# Patient Record
Sex: Male | Born: 1955 | Race: Black or African American | Hispanic: No | Marital: Single | Smoking: Current every day smoker
Health system: Southern US, Community
[De-identification: ages and names within clinical notes are randomized; demographics above are authoritative.]

## PROBLEM LIST (undated history)

## (undated) DIAGNOSIS — I739 Peripheral vascular disease, unspecified: Secondary | ICD-10-CM

## (undated) DIAGNOSIS — F17200 Nicotine dependence, unspecified, uncomplicated: Secondary | ICD-10-CM

## (undated) DIAGNOSIS — G44009 Cluster headache syndrome, unspecified, not intractable: Secondary | ICD-10-CM

## (undated) DIAGNOSIS — J449 Chronic obstructive pulmonary disease, unspecified: Secondary | ICD-10-CM

## (undated) DIAGNOSIS — I2510 Atherosclerotic heart disease of native coronary artery without angina pectoris: Secondary | ICD-10-CM

## (undated) DIAGNOSIS — J9610 Chronic respiratory failure, unspecified whether with hypoxia or hypercapnia: Secondary | ICD-10-CM

## (undated) DIAGNOSIS — I10 Essential (primary) hypertension: Secondary | ICD-10-CM

## (undated) DIAGNOSIS — Z8673 Personal history of transient ischemic attack (TIA), and cerebral infarction without residual deficits: Secondary | ICD-10-CM

## (undated) DIAGNOSIS — D509 Iron deficiency anemia, unspecified: Secondary | ICD-10-CM

## (undated) DIAGNOSIS — E119 Type 2 diabetes mellitus without complications: Secondary | ICD-10-CM

## (undated) DIAGNOSIS — Q2730 Arteriovenous malformation, site unspecified: Secondary | ICD-10-CM

## (undated) DIAGNOSIS — J189 Pneumonia, unspecified organism: Secondary | ICD-10-CM

## (undated) DIAGNOSIS — Z720 Tobacco use: Secondary | ICD-10-CM

## (undated) DIAGNOSIS — I428 Other cardiomyopathies: Secondary | ICD-10-CM

## (undated) DIAGNOSIS — I5042 Chronic combined systolic (congestive) and diastolic (congestive) heart failure: Secondary | ICD-10-CM

## (undated) HISTORY — DX: Other cardiomyopathies: I42.8

## (undated) HISTORY — DX: Peripheral vascular disease, unspecified: I73.9

## (undated) HISTORY — DX: Chronic combined systolic (congestive) and diastolic (congestive) heart failure: I50.42

## (undated) HISTORY — DX: Type 2 diabetes mellitus without complications: E11.9

## (undated) HISTORY — DX: Atherosclerotic heart disease of native coronary artery without angina pectoris: I25.10

## (undated) HISTORY — DX: Nicotine dependence, unspecified, uncomplicated: F17.200

## (undated) HISTORY — DX: Personal history of transient ischemic attack (TIA), and cerebral infarction without residual deficits: Z86.73

## (undated) HISTORY — PX: CATARACT EXTRACTION, BILATERAL: SHX1313

## (undated) HISTORY — DX: Tobacco use: Z72.0

## (undated) HISTORY — PX: COLONOSCOPY W/ BIOPSIES AND POLYPECTOMY: SHX1376

## (undated) HISTORY — DX: Arteriovenous malformation, site unspecified: Q27.30

## (undated) HISTORY — DX: Iron deficiency anemia, unspecified: D50.9

## (undated) HISTORY — DX: Chronic respiratory failure, unspecified whether with hypoxia or hypercapnia: J96.10

## (undated) HISTORY — PX: EXCISION MASS HEAD: SHX6702

---

## 2010-10-10 HISTORY — PX: CARDIAC CATHETERIZATION: SHX172

## 2013-09-09 DIAGNOSIS — J189 Pneumonia, unspecified organism: Secondary | ICD-10-CM

## 2013-09-09 HISTORY — DX: Pneumonia, unspecified organism: J18.9

## 2013-10-03 ENCOUNTER — Inpatient Hospital Stay (HOSPITAL_BASED_OUTPATIENT_CLINIC_OR_DEPARTMENT_OTHER)
Admission: EM | Admit: 2013-10-03 | Discharge: 2013-10-08 | DRG: 189 | Disposition: A | Discharge status: Home / Self Care | Payer: Self-pay | Attending: Internal Medicine | Admitting: Internal Medicine

## 2013-10-03 ENCOUNTER — Encounter (HOSPITAL_BASED_OUTPATIENT_CLINIC_OR_DEPARTMENT_OTHER): Payer: Self-pay | Admitting: Emergency Medicine

## 2013-10-03 ENCOUNTER — Other Ambulatory Visit: Payer: Self-pay

## 2013-10-03 ENCOUNTER — Emergency Department (HOSPITAL_BASED_OUTPATIENT_CLINIC_OR_DEPARTMENT_OTHER): Payer: No Typology Code available for payment source

## 2013-10-03 DIAGNOSIS — I428 Other cardiomyopathies: Secondary | ICD-10-CM | POA: Diagnosis present

## 2013-10-03 DIAGNOSIS — I5043 Acute on chronic combined systolic (congestive) and diastolic (congestive) heart failure: Secondary | ICD-10-CM | POA: Diagnosis present

## 2013-10-03 DIAGNOSIS — I5031 Acute diastolic (congestive) heart failure: Secondary | ICD-10-CM

## 2013-10-03 DIAGNOSIS — Z66 Do not resuscitate: Secondary | ICD-10-CM | POA: Diagnosis present

## 2013-10-03 DIAGNOSIS — I471 Supraventricular tachycardia, unspecified: Secondary | ICD-10-CM | POA: Diagnosis present

## 2013-10-03 DIAGNOSIS — I10 Essential (primary) hypertension: Secondary | ICD-10-CM

## 2013-10-03 DIAGNOSIS — I4719 Other supraventricular tachycardia: Secondary | ICD-10-CM

## 2013-10-03 DIAGNOSIS — J9621 Acute and chronic respiratory failure with hypoxia: Secondary | ICD-10-CM | POA: Diagnosis present

## 2013-10-03 DIAGNOSIS — J449 Chronic obstructive pulmonary disease, unspecified: Secondary | ICD-10-CM

## 2013-10-03 DIAGNOSIS — I2510 Atherosclerotic heart disease of native coronary artery without angina pectoris: Secondary | ICD-10-CM

## 2013-10-03 DIAGNOSIS — I5023 Acute on chronic systolic (congestive) heart failure: Secondary | ICD-10-CM

## 2013-10-03 DIAGNOSIS — F17200 Nicotine dependence, unspecified, uncomplicated: Secondary | ICD-10-CM | POA: Diagnosis present

## 2013-10-03 DIAGNOSIS — J441 Chronic obstructive pulmonary disease with (acute) exacerbation: Secondary | ICD-10-CM

## 2013-10-03 DIAGNOSIS — J189 Pneumonia, unspecified organism: Secondary | ICD-10-CM

## 2013-10-03 DIAGNOSIS — E785 Hyperlipidemia, unspecified: Secondary | ICD-10-CM | POA: Diagnosis present

## 2013-10-03 DIAGNOSIS — E875 Hyperkalemia: Secondary | ICD-10-CM | POA: Diagnosis present

## 2013-10-03 DIAGNOSIS — J9600 Acute respiratory failure, unspecified whether with hypoxia or hypercapnia: Secondary | ICD-10-CM

## 2013-10-03 DIAGNOSIS — R000 Tachycardia, unspecified: Secondary | ICD-10-CM

## 2013-10-03 DIAGNOSIS — I420 Dilated cardiomyopathy: Secondary | ICD-10-CM

## 2013-10-03 DIAGNOSIS — R918 Other nonspecific abnormal finding of lung field: Secondary | ICD-10-CM | POA: Diagnosis present

## 2013-10-03 DIAGNOSIS — Z8673 Personal history of transient ischemic attack (TIA), and cerebral infarction without residual deficits: Secondary | ICD-10-CM

## 2013-10-03 DIAGNOSIS — J432 Centrilobular emphysema: Secondary | ICD-10-CM

## 2013-10-03 DIAGNOSIS — J9620 Acute and chronic respiratory failure, unspecified whether with hypoxia or hypercapnia: Principal | ICD-10-CM | POA: Diagnosis present

## 2013-10-03 DIAGNOSIS — I498 Other specified cardiac arrhythmias: Secondary | ICD-10-CM | POA: Diagnosis present

## 2013-10-03 HISTORY — DX: Essential (primary) hypertension: I10

## 2013-10-03 LAB — CBC WITH DIFFERENTIAL/PLATELET
Basophils Absolute: 0 10*3/uL (ref 0.0–0.1)
Basophils Relative: 0 % (ref 0–1)
Eosinophils Absolute: 0 10*3/uL (ref 0.0–0.7)
Eosinophils Relative: 0 % (ref 0–5)
HCT: 48.5 % (ref 39.0–52.0)
Hemoglobin: 15.5 g/dL (ref 13.0–17.0)
Lymphocytes Relative: 27 % (ref 12–46)
Lymphs Abs: 1.8 10*3/uL (ref 0.7–4.0)
MCH: 30.2 pg (ref 26.0–34.0)
MCHC: 32 g/dL (ref 30.0–36.0)
MCV: 94.5 fL (ref 78.0–100.0)
Monocytes Absolute: 0.8 10*3/uL (ref 0.1–1.0)
Monocytes Relative: 12 % (ref 3–12)
Neutro Abs: 4 10*3/uL (ref 1.7–7.7)
Neutrophils Relative %: 60 % (ref 43–77)
Platelets: 170 10*3/uL (ref 150–400)
RBC: 5.13 MIL/uL (ref 4.22–5.81)
RDW: 14.7 % (ref 11.5–15.5)
WBC: 6.6 10*3/uL (ref 4.0–10.5)

## 2013-10-03 LAB — I-STAT CG4 LACTIC ACID, ED: Lactic Acid, Venous: 1.79 mmol/L (ref 0.5–2.2)

## 2013-10-03 LAB — TROPONIN I
Troponin I: <0.3 ng/mL (ref <0.30)
Troponin I: <0.3 ng/mL (ref <0.30)
Troponin I: <0.3 ng/mL (ref <0.30)

## 2013-10-03 LAB — CBC
HCT: 46.8 % (ref 39.0–52.0)
Hemoglobin: 14.9 g/dL (ref 13.0–17.0)
MCH: 30.2 pg (ref 26.0–34.0)
MCHC: 31.8 g/dL (ref 30.0–36.0)
MCV: 94.9 fL (ref 78.0–100.0)
Platelets: 167 10*3/uL (ref 150–400)
RBC: 4.93 MIL/uL (ref 4.22–5.81)
RDW: 14.5 % (ref 11.5–15.5)
WBC: 6.8 10*3/uL (ref 4.0–10.5)

## 2013-10-03 LAB — HIV ANTIBODY (ROUTINE TESTING W REFLEX): HIV 1&2 Ab, 4th Generation: NONREACTIVE

## 2013-10-03 LAB — PRO B NATRIURETIC PEPTIDE: Pro B Natriuretic peptide (BNP): 1690 pg/mL — ABNORMAL HIGH (ref 0–125)

## 2013-10-03 LAB — I-STAT ARTERIAL BLOOD GAS, ED
Acid-Base Excess: 2 mmol/L (ref 0.0–2.0)
Bicarbonate: 29.8 mEq/L — ABNORMAL HIGH (ref 20.0–24.0)
O2 Saturation: 89 %
Patient temperature: 100.4
TCO2: 32 mmol/L (ref 0–100)
pCO2 arterial: 61.9 mmHg (ref 35.0–45.0)
pH, Arterial: 7.295 — ABNORMAL LOW (ref 7.350–7.450)
pO2, Arterial: 68 mmHg — ABNORMAL LOW (ref 80.0–100.0)

## 2013-10-03 LAB — BASIC METABOLIC PANEL
BUN: 16 mg/dL (ref 6–23)
CO2: 29 mEq/L (ref 19–32)
Calcium: 9.1 mg/dL (ref 8.4–10.5)
Chloride: 103 mEq/L (ref 96–112)
Creatinine, Ser: 1 mg/dL (ref 0.50–1.35)
GFR calc Af Amer: >90 mL/min (ref >90)
GFR calc non Af Amer: 80 mL/min — ABNORMAL LOW (ref >90)
Glucose, Bld: 116 mg/dL — ABNORMAL HIGH (ref 70–99)
Potassium: 4.1 mEq/L (ref 3.7–5.3)
Sodium: 144 mEq/L (ref 137–147)

## 2013-10-03 LAB — CREATININE, SERUM
Creatinine, Ser: 0.88 mg/dL (ref 0.50–1.35)
GFR calc Af Amer: >90 mL/min (ref >90)
GFR calc non Af Amer: >90 mL/min (ref >90)

## 2013-10-03 LAB — HEMOGLOBIN A1C
Hgb A1c MFr Bld: 6.2 % — ABNORMAL HIGH (ref <5.7)
Mean Plasma Glucose: 131 mg/dL — ABNORMAL HIGH (ref <117)

## 2013-10-03 LAB — STREP PNEUMONIAE URINARY ANTIGEN: Strep Pneumo Urinary Antigen: NEGATIVE

## 2013-10-03 MED ORDER — SODIUM CHLORIDE 0.9 % IV SOLN
INTRAVENOUS | Status: DC
  Administered 2013-10-03 (×2): via INTRAVENOUS

## 2013-10-03 MED ORDER — DEXTROSE 5 % IV SOLN
500.0000 mg | INTRAVENOUS | Status: DC
  Administered 2013-10-03 – 2013-10-04 (×2): 500 mg via INTRAVENOUS
  Filled 2013-10-03 (×3): qty 500

## 2013-10-03 MED ORDER — HYDROCODONE-ACETAMINOPHEN 5-325 MG PO TABS: 1.0000 | ORAL_TABLET | ORAL | Status: DC | PRN

## 2013-10-03 MED ORDER — SODIUM CHLORIDE 0.9 % IV SOLN
INTRAVENOUS | Status: DC
  Administered 2013-10-03: 07:00:00 via INTRAVENOUS

## 2013-10-03 MED ORDER — CEFTRIAXONE SODIUM 1 G IJ SOLR
INTRAMUSCULAR | Status: AC
  Filled 2013-10-03: qty 10

## 2013-10-03 MED ORDER — PREDNISONE 50 MG PO TABS
50.0000 mg | ORAL_TABLET | Freq: Every day | ORAL | Status: DC
  Administered 2013-10-03 – 2013-10-04 (×2): 50 mg via ORAL
  Filled 2013-10-03 (×4): qty 1

## 2013-10-03 MED ORDER — IPRATROPIUM-ALBUTEROL 0.5-2.5 (3) MG/3ML IN SOLN
3.0000 mL | RESPIRATORY_TRACT | Status: DC
  Administered 2013-10-03: 3 mL via RESPIRATORY_TRACT
  Filled 2013-10-03: qty 3

## 2013-10-03 MED ORDER — ACETAMINOPHEN 500 MG PO TABS
1000.0000 mg | ORAL_TABLET | Freq: Once | ORAL | Status: AC
  Administered 2013-10-03: 1000 mg via ORAL
  Filled 2013-10-03: qty 2

## 2013-10-03 MED ORDER — ONDANSETRON HCL 4 MG/2ML IJ SOLN: 4.0000 mg | Freq: Four times a day (QID) | INTRAMUSCULAR | Status: DC | PRN

## 2013-10-03 MED ORDER — CARVEDILOL 6.25 MG PO TABS
6.2500 mg | ORAL_TABLET | Freq: Two times a day (BID) | ORAL | Status: DC
  Administered 2013-10-03 – 2013-10-04 (×2): 6.25 mg via ORAL
  Filled 2013-10-03 (×5): qty 1

## 2013-10-03 MED ORDER — GUAIFENESIN-DM 100-10 MG/5ML PO SYRP
5.0000 mL | ORAL_SOLUTION | ORAL | Status: DC | PRN
  Administered 2013-10-04: 5 mL via ORAL
  Filled 2013-10-03: qty 5

## 2013-10-03 MED ORDER — ONDANSETRON HCL 4 MG PO TABS: 4.0000 mg | ORAL_TABLET | Freq: Four times a day (QID) | ORAL | Status: DC | PRN

## 2013-10-03 MED ORDER — ASPIRIN EC 81 MG PO TBEC
81.0000 mg | DELAYED_RELEASE_TABLET | Freq: Every day | ORAL | Status: DC
  Administered 2013-10-03 – 2013-10-08 (×6): 81 mg via ORAL
  Filled 2013-10-03 (×6): qty 1

## 2013-10-03 MED ORDER — ALBUTEROL SULFATE (2.5 MG/3ML) 0.083% IN NEBU
2.5000 mg | INHALATION_SOLUTION | Freq: Once | RESPIRATORY_TRACT | Status: AC
  Administered 2013-10-03: 2.5 mg via RESPIRATORY_TRACT
  Filled 2013-10-03: qty 3

## 2013-10-03 MED ORDER — IPRATROPIUM-ALBUTEROL 0.5-2.5 (3) MG/3ML IN SOLN
3.0000 mL | Freq: Once | RESPIRATORY_TRACT | Status: AC
  Administered 2013-10-03: 3 mL via RESPIRATORY_TRACT
  Filled 2013-10-03: qty 3

## 2013-10-03 MED ORDER — AZITHROMYCIN 500 MG IV SOLR
500.0000 mg | Freq: Once | INTRAVENOUS | Status: AC
  Administered 2013-10-03: 500 mg via INTRAVENOUS

## 2013-10-03 MED ORDER — DEXTROSE 5 % IV SOLN
1.0000 g | INTRAVENOUS | Status: DC
  Administered 2013-10-03 – 2013-10-07 (×5): 1 g via INTRAVENOUS
  Filled 2013-10-03 (×5): qty 10

## 2013-10-03 MED ORDER — DEXTROSE 5 % IV SOLN
1.0000 g | Freq: Once | INTRAVENOUS | Status: AC
  Administered 2013-10-03: 1 g via INTRAVENOUS

## 2013-10-03 MED ORDER — IBUPROFEN 600 MG PO TABS
600.0000 mg | ORAL_TABLET | Freq: Four times a day (QID) | ORAL | Status: DC | PRN
  Administered 2013-10-03: 600 mg via ORAL
  Filled 2013-10-03: qty 1

## 2013-10-03 MED ORDER — AMLODIPINE BESYLATE 10 MG PO TABS
10.0000 mg | ORAL_TABLET | Freq: Every day | ORAL | Status: DC
  Administered 2013-10-03 – 2013-10-07 (×5): 10 mg via ORAL
  Filled 2013-10-03 (×5): qty 1

## 2013-10-03 MED ORDER — METHYLPREDNISOLONE SODIUM SUCC 125 MG IJ SOLR
125.0000 mg | Freq: Once | INTRAMUSCULAR | Status: AC
  Administered 2013-10-03: 125 mg via INTRAVENOUS
  Filled 2013-10-03: qty 2

## 2013-10-03 MED ORDER — HEPARIN SODIUM (PORCINE) 5000 UNIT/ML IJ SOLN
5000.0000 [IU] | Freq: Three times a day (TID) | INTRAMUSCULAR | Status: DC
  Administered 2013-10-03 – 2013-10-04 (×3): 5000 [IU] via SUBCUTANEOUS
  Filled 2013-10-03 (×3): qty 1

## 2013-10-03 MED ORDER — ALBUTEROL SULFATE (2.5 MG/3ML) 0.083% IN NEBU
2.5000 mg | INHALATION_SOLUTION | RESPIRATORY_TRACT | Status: DC | PRN
  Administered 2013-10-03 – 2013-10-04 (×4): 2.5 mg via RESPIRATORY_TRACT
  Filled 2013-10-03 (×4): qty 3

## 2013-10-03 NOTE — Progress Notes (Signed)
ABG done while patient was on 2 liters of O2.

## 2013-10-03 NOTE — ED Notes (Signed)
Pts sister [name], [phone].

## 2013-10-03 NOTE — Progress Notes (Signed)
Spoke with Dr Judd Lien, who has re-assessed the patient, he thinks the patient has significantly improved, and does not need a telemetry bed, he recommends a telemetry bed instead. Now accepted to telemetry.

## 2013-10-03 NOTE — ED Notes (Signed)
Pts room air sat on arrival was 78% per RN.

## 2013-10-03 NOTE — Plan of Care (Signed)
Problem: Phase I Progression Outcomes Goal: First antibiotic given within 6hrs of admit Outcome: Progressing First antibiotic given. Goal: Code status addressed with pt/family Patient told MD he want to be DNR.

## 2013-10-03 NOTE — ED Notes (Signed)
Pt reports sob, chest pain, and headache since Thursday evening, + hypertension, has not taken  Any meds for htn in 3 years

## 2013-10-03 NOTE — H&P (Signed)
Patient Demographics  Jared [name], is a 58 y.o. male  MRN: [id number]   DOB - [birth year]  Admit Date - 10/03/2013  Outpatient Primary MD for the patient is No PCP Per Patient   With History of -  Past Medical History  Diagnosis Date  . Hypertension       History reviewed. No pertinent past surgical history.  in for   Chief Complaint  Patient presents with  . Shortness of Breath     HPI  Jared [name]  is a 58 y.o. male, with no known medical problems except hypertension which he was told several years ago, has not seen a physician in many years and is on no home medications. [facility] with chief complaints of fever, productive cough and shortness of breath. These symptoms started about 4 days ago, at [facility] ER his workup was consistent with acute hypoxic and hypercapnic respiratory failure, community-acquired pneumonia, initially patient required BiPAP however within an hour he came off of BiPAP and was stable on nasal cannula oxygen. He also had mild left-sided chest pain initially upon his presentation which has now completely resolved. Hospitalist were consulted for inpatient workup for above dictated problems.   Patient currently in bed appears comfortable and nontoxic, mild generalized headache, currently no fever chills, currently chest pain-free, shortness of breath is much improved and he is in no distress, does have a productive cough, no abdominal pain, no diarrhea, no blood or mucus in stool, no dysuria, no focal weakness. No unexplained weight loss. He denies choking on food, recent travel or exposure to sick contacts.    Review of Systems    In addition to the HPI above,   No Fever-chills, No Headache, No changes with Vision or hearing, No problems  swallowing food or Liquids, No Chest pain, ++ Cough & Shortness of Breath, No Abdominal pain, No Nausea or Vommitting, Bowel movements are regular, No Blood in stool or Urine, No dysuria, No new skin rashes or bruises, No new joints pains-aches,  No new weakness, tingling, numbness in any extremity, No recent weight gain or loss, No polyuria, polydypsia or polyphagia, No significant Mental Stressors.  A full 10 point Review of Systems was done, except as stated above, all other Review of Systems were negative.   Social History History  Substance Use Topics  . Smoking status: Current Every Day Smoker -- 2.00 packs/day    Types: Cigarettes  . Smokeless tobacco: Not on file  . Alcohol Use: Yes     Comment: occasionally     Family History No CAD at early age    Prior to Admission medications   Not on File    No Known Allergies  Physical Exam  Vitals  Blood pressure 145/89, pulse 102, temperature 98.7 F (37.1 C), temperature source Oral, resp. rate 20, height 6\' 4"  (1.93 m), SpO2 97.00%.   1. General middle aged AA male  lying in bed in NAD,     2. Normal affect and insight, Not Suicidal or Homicidal, Awake Alert, Oriented X 3.  3. No F.N deficits, ALL C.Nerves Intact, Strength 5/5 all 4 extremities, Sensation intact all 4 extremities, Plantars down going.  4. Ears and Eyes appear Normal, Conjunctivae clear, PERRLA. Moist Oral Mucosa.  5. Supple Neck, No JVD, No cervical lymphadenopathy appriciated, No Carotid Bruits.  6. Symmetrical Chest wall movement, Good air movement bilaterally, RLL rales  7. RRR, No Gallops, Rubs or Murmurs, No Parasternal Heave.  8. Positive Bowel Sounds, Abdomen Soft, Non tender, No organomegaly appriciated,No rebound -guarding or rigidity.  9.  No Cyanosis, Normal Skin Turgor, No Skin Rash or Bruise.  10. Good muscle tone,  joints appear normal , no effusions, Normal ROM.  11. No Palpable Lymph Nodes in Neck or Axillae     Data  Review  CBC  Recent Labs Lab 10/03/13 0550  WBC 6.6  HGB 15.5  HCT 48.5  PLT 170  MCV 94.5  MCH 30.2  MCHC 32.0  RDW 14.7  LYMPHSABS 1.8  MONOABS 0.8  EOSABS 0.0  BASOSABS 0.0   ------------------------------------------------------------------------------------------------------------------  Chemistries   Recent Labs Lab 10/03/13 0550  NA 144  K 4.1  CL 103  CO2 29  GLUCOSE 116*  BUN 16  CREATININE 1.00  CALCIUM 9.1   ------------------------------------------------------------------------------------------------------------------ CrCl is unknown because both a height and weight (above a minimum accepted value) are required for this calculation. ------------------------------------------------------------------------------------------------------------------ No results found for this basename: TSH, T4TOTAL, FREET3, T3FREE, THYROIDAB,  in the last 72 hours   Coagulation profile No results found for this basename: INR, PROTIME,  in the last 168 hours ------------------------------------------------------------------------------------------------------------------- No results found for this basename: DDIMER,  in the last 72 hours -------------------------------------------------------------------------------------------------------------------  Cardiac Enzymes  Recent Labs Lab 10/03/13 0550  TROPONINI <0.30   ------------------------------------------------------------------------------------------------------------------ No components found with this basename: POCBNP,    ---------------------------------------------------------------------------------------------------------------  Urinalysis No results found for this basename: colorurine,  appearanceur,  labspec,  phurine,  glucoseu,  hgbur,  bilirubinur,  ketonesur,  proteinur,  urobilinogen,  nitrite,  leukocytesur     ----------------------------------------------------------------------------------------------------------------  Imaging results:   Dg Chest 2 View  10/03/2013   CLINICAL DATA:  Shortness of breath, chest pain and headache.  EXAM: CHEST  2 VIEW  COMPARISON:  None.  FINDINGS: The lungs are well-aerated. Patchy right-sided airspace opacity raises concern for pneumonia. There is no evidence of pleural effusion or pneumothorax.  The heart is normal in size; the mediastinal contour is within normal limits. No acute osseous abnormalities are seen.  IMPRESSION: Patchy right-sided pneumonia noted.   Electronically Signed   By: Roanna Raider M.D.   On: 10/03/2013 06:31    My personal review of EKG: Rhythm S Tach, Rate 108 /min, early LVH , no Acute ST changes    Assessment & Plan    1. Acute hypoxic and hypercapnic respiratory failure. Currently much better, stable on nasal cannula oxygen and appears to be in no distress. Symptoms are brought on by committee acquired pneumonia, initially he did have some evidence of carbon dioxide retention, he was given one dose of IV Solu-Medrol. Currently no wheezing whatsoever. He could have mild underlying COPD with mild exacerbation due to pneumonia.  Plan is to admit him on a telemetry bed, will place him on oral steroids however if he has no wheezing this could be stopped. Will obtain blood and sputum cultures, HIV serology, strep pneumo and Legionella antigen. Empiric  antibiotics which will be IV Rocephin and erythromycin. Oxygen and nebulizer treatments as needed. There is no evidence of aspiration or choking in the history.     2. Chest pain upon admission. Patient has had no medical followup for several years, he could have risk factors for CAD undiagnosed. Will cycle troponins, obtain echo gram to evaluate wall motion, could have underlying LVH due to uncontrolled hypertension. We'll check A1c and lipid panel were disconnected evaluation since he  does not have a PCP. For now we'll place him on low-dose aspirin and Coreg. I do not appreciate any wheezing.     3. Hypertension. Will place him on Coreg and Norvasc.     4.Smoking - counseled.      DVT Prophylaxis Heparin   - SCDs    AM Labs Ordered, also please review Full Orders  Family Communication: Admission, patients condition and plan of care including tests being ordered have been discussed with the patient  who indicates understanding and agree with the plan and Code Status.  Code Status DNR  Likely DC to Home  Condition GUARDED    Time spent in minutes : 35    Leroy SeaPrashant K Jene Huq M.D on 10/03/2013 at 11:44 AM  Between 7am to 7pm - Pager - [phone]  After 7pm go to [email or web address] - password [email or web address]  And look for the night coverage person covering me after hours  [facility] Office  [phone]

## 2013-10-03 NOTE — Progress Notes (Signed)
ANTIBIOTIC CONSULT NOTE - INITIAL  Pharmacy Consult for renal adjust antibiotics Indication: pneumonia  No Known Allergies  Patient Measurements: Height: 6\' 4"  (193 cm) IBW/kg (Calculated) : 86.8   Vital Signs: Temp: 98.7 F (37.1 C) (04/25 1016) Temp src: Oral (04/25 1016) BP: 145/89 mmHg (04/25 1016) Pulse Rate: 102 (04/25 1016) Intake/Output from previous day:   Intake/Output from this shift:    Labs:  Recent Labs  10/03/13 0550  WBC 6.6  HGB 15.5  PLT 170  CREATININE 1.00   The CrCl is unknown because both a height and weight (above a minimum accepted value) are required for this calculation. No results found for this basename: VANCOTROUGH, VANCOPEAK, VANCORANDOM, GENTTROUGH, GENTPEAK, GENTRANDOM, TOBRATROUGH, TOBRAPEAK, TOBRARND, AMIKACINPEAK, AMIKACINTROU, AMIKACIN,  in the last 72 hours   Microbiology: No results found for this or any previous visit (from the past 720 hour(s)).  Medical History: Past Medical History  Diagnosis Date  . Hypertension     Medications:  No prescriptions prior to admission   Assessment: 58 yo man on rocephin  and azithromycin for PNA.  Neither needs adjustment for renal function  Goal of Therapy:  Eradication of infection  Plan:  Cont Abx as ordered  Spike Desilets Poteet Danyl Deems 10/03/2013,11:46 AM

## 2013-10-03 NOTE — ED Notes (Signed)
Pt presents w/ c/o increased shortness of breath, centralized chest pain, productive cough and smoking history. Pt w/ audible wheezing and increased work of breathing on arrival to department. Pt denies hx of asthma or bronchitis or recent fever.

## 2013-10-03 NOTE — ED Provider Notes (Signed)
CSN: [id number]     Arrival date & time 10/03/13  0539 History   First MD Initiated Contact with Patient 10/03/13 [phone]     Chief Complaint  Patient presents with  . Shortness of Breath     (Consider location/radiation/quality/duration/timing/severity/associated sxs/prior Treatment) HPI This is a 58 year old male with a history of cigarette smoking. He is here with a two-day history of chest pain. The chest pain is located in the left parasternal area. The pain is sharp and worse with attempted deep breathing and with coughing. The chest pain has been associated with shortness of breath, difficulty taking a deep breath, subjective fever but no chillls, headache and weakness. He was noted to be hypoxic in the 70s on arrival. His symptoms are moderate to severe. He denies nausea, vomiting or diarrhea. He is fairly new to the area and lives with his sister. He does not have a local physician. He has not taken any antihypertensives in 3 years.  Past Medical History  Diagnosis Date  . Hypertension    History reviewed. No pertinent past surgical history. History reviewed. No pertinent family history. History  Substance Use Topics  . Smoking status: Current Every Day Smoker -- 2.00 packs/day    Types: Cigarettes  . Smokeless tobacco: Not on file  . Alcohol Use: Yes     Comment: occasionally    Review of Systems  All other systems reviewed and are negative.   Allergies  Review of patient's allergies indicates no known allergies.  Home Medications   Prior to Admission medications   Not on File   BP 172/109  Pulse 112  Temp(Src) 100.4 F (38 C) (Oral)  Resp 31  Ht 6\' 4"  (1.93 m)  SpO2 93%  Physical Exam General: Well-developed, well-nourished male in no acute distress; appearance consistent with age of record HENT: normocephalic; atraumatic Eyes: pupils equal, round and reactive to light; extraocular muscles intact; arcus senilis bilaterally Neck: supple Heart: regular rate  and rhythm; tachycardia Lungs: Decreased air movement bilaterally; shallow breaths; faint expiratory wheezes; tachypnea Abdomen: soft; nondistended; mild diffuse tenderness; no masses or hepatosplenomegaly; bowel sounds present Extremities: No deformity; full range of motion; pulses normal; no edema Neurologic: Awake, alert and oriented; motor function intact in all extremities and symmetric; no facial droop Skin: Warm and dry Psychiatric: Flat affect    ED Course  Procedures (including critical care time)  CRITICAL CARE Performed by: Carlisle Beers Saia Derossett Total critical care time: 35 minutes Critical care time was exclusive of separately billable procedures and treating other patients. Critical care was necessary to treat or prevent imminent or life-threatening deterioration. Critical care was time spent personally by me on the following activities: development of treatment plan with patient and/or surrogate as well as nursing, discussions with consultants, evaluation of patient's response to treatment, examination of patient, obtaining history from patient or surrogate, ordering and performing treatments and interventions, ordering and review of laboratory studies, ordering and review of radiographic studies, pulse oximetry and re-evaluation of patient's condition.   MDM  EKG Interpretation:  Date & Time: 10/03/2013 5:47 AM  Rate: 109  Rhythm: sinus tachycardia  QRS Axis: left  Intervals: normal  ST/T Wave abnormalities: normal  Conduction Disutrbances:none  Narrative Interpretation: poor R-wave progression  Old EKG Reviewed: none available  Nursing notes and vitals signs, including pulse oximetry, reviewed.  Summary of this visit's results, reviewed by myself:  Labs:  Results for orders placed during the hospital encounter of 10/03/13 (from the past 24 hour(s))  CBC WITH DIFFERENTIAL     Status: None   Collection Time    10/03/13  5:50 AM      Result Value Ref Range   WBC 6.6   4.0 - 10.5 K/uL   RBC 5.13  4.22 - 5.81 MIL/uL   Hemoglobin 15.5  13.0 - 17.0 g/dL   HCT 16.148.5  09.639.0 - 04.552.0 %   MCV 94.5  78.0 - 100.0 fL   MCH 30.2  26.0 - 34.0 pg   MCHC 32.0  30.0 - 36.0 g/dL   RDW 40.914.7  81.111.5 - 91.415.5 %   Platelets 170  150 - 400 K/uL   Neutrophils Relative % 60  43 - 77 %   Neutro Abs 4.0  1.7 - 7.7 K/uL   Lymphocytes Relative 27  12 - 46 %   Lymphs Abs 1.8  0.7 - 4.0 K/uL   Monocytes Relative 12  3 - 12 %   Monocytes Absolute 0.8  0.1 - 1.0 K/uL   Eosinophils Relative 0  0 - 5 %   Eosinophils Absolute 0.0  0.0 - 0.7 K/uL   Basophils Relative 0  0 - 1 %   Basophils Absolute 0.0  0.0 - 0.1 K/uL  BASIC METABOLIC PANEL     Status: Abnormal   Collection Time    10/03/13  5:50 AM      Result Value Ref Range   Sodium 144  137 - 147 mEq/L   Potassium 4.1  3.7 - 5.3 mEq/L   Chloride 103  96 - 112 mEq/L   CO2 29  19 - 32 mEq/L   Glucose, Bld 116 (*) 70 - 99 mg/dL   BUN 16  6 - 23 mg/dL   Creatinine, Ser 7.821.00  0.50 - 1.35 mg/dL   Calcium 9.1  8.4 - 95.610.5 mg/dL   GFR calc non Af Amer 80 (*) >90 mL/min   GFR calc Af Amer >90  >90 mL/min  PRO B NATRIURETIC PEPTIDE     Status: Abnormal   Collection Time    10/03/13  5:50 AM      Result Value Ref Range   Pro B Natriuretic peptide (BNP) 1690.0 (*) 0 - 125 pg/mL  TROPONIN I     Status: None   Collection Time    10/03/13  5:50 AM      Result Value Ref Range   Troponin I <0.30  <0.30 ng/mL  I-STAT CG4 LACTIC ACID, ED     Status: None   Collection Time    10/03/13  6:21 AM      Result Value Ref Range   Lactic Acid, Venous 1.79  0.5 - 2.2 mmol/L  I-STAT ARTERIAL BLOOD GAS, ED     Status: Abnormal   Collection Time    10/03/13  6:39 AM      Result Value Ref Range   pH, Arterial 7.295 (*) 7.350 - 7.450   pCO2 arterial 61.9 (*) 35.0 - 45.0 mmHg   pO2, Arterial 68.0 (*) 80.0 - 100.0 mmHg   Bicarbonate 29.8 (*) 20.0 - 24.0 mEq/L   TCO2 32  0 - 100 mmol/L   O2 Saturation 89.0     Acid-Base Excess 2.0  0.0 - 2.0 mmol/L    Patient temperature 100.4 F     Collection site RADIAL, ALLEN'S TEST ACCEPTABLE     Drawn by RT     Sample type ARTERIAL     Comment NOTIFIED PHYSICIAN  Imaging Studies: Dg Chest 2 View  10/03/2013   CLINICAL DATA:  Shortness of breath, chest pain and headache.  EXAM: CHEST  2 VIEW  COMPARISON:  None.  FINDINGS: The lungs are well-aerated. Patchy right-sided airspace opacity raises concern for pneumonia. There is no evidence of pleural effusion or pneumothorax.  The heart is normal in size; the mediastinal contour is within normal limits. No acute osseous abnormalities are seen.  IMPRESSION: Patchy right-sided pneumonia noted.   Electronically Signed   By: Roanna Raider M.D.   On: 10/03/2013 06:31    6:19 AM Sat 94-9% on 2L after albuterol/Atrovent neb treatment. Air movement improved, wheezing now present.  6:29 AM Rocephin 1 g IV and Zithromax 500 mg IV ordered for community-acquired pneumonia. Second neb treatment ordered.  6:52 AM Second albuterol/Atrovent neb treatment being administered. Arterial blood gas findings are consistent with hypoxia and partially compensated respiratory acidosis. Dr. Allena Katz has asked that the patient transferred to [facility] stepdown. Lactate is not consistent with a Code Sepsis at this time.      Hanley Seamen, MD 10/03/13 [phone]

## 2013-10-03 NOTE — ED Provider Notes (Signed)
Care assumed from Dr. Read Drivers at shift change.  Patient awaiting admission to [facility] cone pending step down bed availability. Apparently there has been a long delay and no foreseeable step down beds. I reevaluated the patient. His saturations are in the upper 90s on 3 L and his wheezing has improved compared to what was documented earlier. He is in no respiratory distress and I feel as though he is appropriate for a telemetry bed. The orders have been changed and the patient will be admitted to telemetry.  Jared Lyons, MD 10/03/13 [phone]

## 2013-10-04 ENCOUNTER — Inpatient Hospital Stay (HOSPITAL_COMMUNITY): Payer: No Typology Code available for payment source

## 2013-10-04 DIAGNOSIS — R000 Tachycardia, unspecified: Secondary | ICD-10-CM

## 2013-10-04 DIAGNOSIS — J9600 Acute respiratory failure, unspecified whether with hypoxia or hypercapnia: Secondary | ICD-10-CM

## 2013-10-04 DIAGNOSIS — I498 Other specified cardiac arrhythmias: Secondary | ICD-10-CM

## 2013-10-04 DIAGNOSIS — J432 Centrilobular emphysema: Secondary | ICD-10-CM

## 2013-10-04 DIAGNOSIS — I5031 Acute diastolic (congestive) heart failure: Secondary | ICD-10-CM

## 2013-10-04 DIAGNOSIS — J441 Chronic obstructive pulmonary disease with (acute) exacerbation: Secondary | ICD-10-CM

## 2013-10-04 DIAGNOSIS — I517 Cardiomegaly: Secondary | ICD-10-CM

## 2013-10-04 LAB — BASIC METABOLIC PANEL
BUN: 20 mg/dL (ref 6–23)
BUN: 18 mg/dL (ref 6–23)
CO2: 30 mEq/L (ref 19–32)
CO2: 29 mEq/L (ref 19–32)
Calcium: 8.8 mg/dL (ref 8.4–10.5)
Calcium: 8.6 mg/dL (ref 8.4–10.5)
Chloride: 103 mEq/L (ref 96–112)
Chloride: 103 mEq/L (ref 96–112)
Creatinine, Ser: 1.08 mg/dL (ref 0.50–1.35)
Creatinine, Ser: 0.91 mg/dL (ref 0.50–1.35)
GFR calc Af Amer: 85 mL/min — ABNORMAL LOW (ref >90)
GFR calc Af Amer: >90 mL/min (ref >90)
GFR calc non Af Amer: 73 mL/min — ABNORMAL LOW (ref >90)
GFR calc non Af Amer: >90 mL/min (ref >90)
Glucose, Bld: 147 mg/dL — ABNORMAL HIGH (ref 70–99)
Glucose, Bld: 147 mg/dL — ABNORMAL HIGH (ref 70–99)
Potassium: 6 mEq/L — ABNORMAL HIGH (ref 3.7–5.3)
Potassium: 5.1 mEq/L (ref 3.7–5.3)
Sodium: 145 mEq/L (ref 137–147)
Sodium: 142 mEq/L (ref 137–147)

## 2013-10-04 LAB — LIPID PANEL
Cholesterol: 170 mg/dL (ref 0–200)
HDL: 51 mg/dL (ref >39)
LDL Cholesterol: 106 mg/dL — ABNORMAL HIGH (ref 0–99)
Total CHOL/HDL Ratio: 3.3 RATIO
Triglycerides: 65 mg/dL (ref <150)
VLDL: 13 mg/dL (ref 0–40)

## 2013-10-04 LAB — CBC
HCT: 48.5 % (ref 39.0–52.0)
Hemoglobin: 15 g/dL (ref 13.0–17.0)
MCH: 30 pg (ref 26.0–34.0)
MCHC: 30.9 g/dL (ref 30.0–36.0)
MCV: 97 fL (ref 78.0–100.0)
Platelets: 191 10*3/uL (ref 150–400)
RBC: 5 MIL/uL (ref 4.22–5.81)
RDW: 14.5 % (ref 11.5–15.5)
WBC: 8.2 10*3/uL (ref 4.0–10.5)

## 2013-10-04 LAB — D-DIMER, QUANTITATIVE (NOT AT ARMC): D-Dimer, Quant: 0.73 ug/mL-FEU — ABNORMAL HIGH (ref 0.00–0.48)

## 2013-10-04 LAB — LEGIONELLA ANTIGEN, URINE: Legionella Antigen, Urine: NEGATIVE

## 2013-10-04 LAB — EXPECTORATED SPUTUM ASSESSMENT W GRAM STAIN, RFLX TO RESP C

## 2013-10-04 LAB — TROPONIN I: Troponin I: <0.3 ng/mL (ref <0.30)

## 2013-10-04 MED ORDER — IPRATROPIUM BROMIDE 0.02 % IN SOLN
0.5000 mg | RESPIRATORY_TRACT | Status: DC
  Administered 2013-10-04 – 2013-10-05 (×7): 0.5 mg via RESPIRATORY_TRACT
  Filled 2013-10-04 (×7): qty 2.5

## 2013-10-04 MED ORDER — ACETAMINOPHEN 325 MG PO TABS: 650.0000 mg | ORAL_TABLET | Freq: Four times a day (QID) | ORAL | Status: DC | PRN

## 2013-10-04 MED ORDER — BENZONATATE 100 MG PO CAPS
200.0000 mg | ORAL_CAPSULE | Freq: Three times a day (TID) | ORAL | Status: DC
  Administered 2013-10-04 – 2013-10-08 (×12): 200 mg via ORAL
  Filled 2013-10-04 (×17): qty 2

## 2013-10-04 MED ORDER — METOPROLOL TARTRATE 1 MG/ML IV SOLN
5.0000 mg | Freq: Four times a day (QID) | INTRAVENOUS | Status: DC | PRN
Start: 2013-10-04 — End: 2013-10-07

## 2013-10-04 MED ORDER — NICOTINE 21 MG/24HR TD PT24
21.0000 mg | MEDICATED_PATCH | Freq: Every day | TRANSDERMAL | Status: DC
  Administered 2013-10-04 – 2013-10-08 (×5): 21 mg via TRANSDERMAL
  Filled 2013-10-04 (×5): qty 1

## 2013-10-04 MED ORDER — ARFORMOTEROL TARTRATE 15 MCG/2ML IN NEBU
15.0000 ug | INHALATION_SOLUTION | Freq: Two times a day (BID) | RESPIRATORY_TRACT | Status: DC
  Administered 2013-10-04 – 2013-10-07 (×7): 15 ug via RESPIRATORY_TRACT
  Filled 2013-10-04 (×9): qty 2

## 2013-10-04 MED ORDER — METOPROLOL TARTRATE 12.5 MG HALF TABLET
12.5000 mg | ORAL_TABLET | Freq: Two times a day (BID) | ORAL | Status: DC
  Administered 2013-10-04: 12.5 mg via ORAL
  Filled 2013-10-04 (×3): qty 1

## 2013-10-04 MED ORDER — ENOXAPARIN SODIUM 40 MG/0.4ML ~~LOC~~ SOLN
40.0000 mg | SUBCUTANEOUS | Status: DC
  Administered 2013-10-04 – 2013-10-08 (×5): 40 mg via SUBCUTANEOUS
  Filled 2013-10-04 (×5): qty 0.4

## 2013-10-04 MED ORDER — BUDESONIDE 0.5 MG/2ML IN SUSP
0.5000 mg | Freq: Two times a day (BID) | RESPIRATORY_TRACT | Status: DC
  Administered 2013-10-04 – 2013-10-07 (×7): 0.5 mg via RESPIRATORY_TRACT
  Filled 2013-10-04 (×9): qty 2

## 2013-10-04 MED ORDER — LEVALBUTEROL HCL 1.25 MG/0.5ML IN NEBU
1.2500 mg | INHALATION_SOLUTION | RESPIRATORY_TRACT | Status: DC
  Administered 2013-10-04 – 2013-10-05 (×6): 1.25 mg via RESPIRATORY_TRACT
  Filled 2013-10-04 (×13): qty 0.5

## 2013-10-04 MED ORDER — FUROSEMIDE 10 MG/ML IJ SOLN
40.0000 mg | Freq: Two times a day (BID) | INTRAMUSCULAR | Status: DC
  Administered 2013-10-04 – 2013-10-07 (×6): 40 mg via INTRAVENOUS
  Filled 2013-10-04 (×8): qty 4

## 2013-10-04 MED ORDER — LISINOPRIL 2.5 MG PO TABS
2.5000 mg | ORAL_TABLET | Freq: Every day | ORAL | Status: DC
  Administered 2013-10-04: 2.5 mg via ORAL
  Filled 2013-10-04 (×2): qty 1

## 2013-10-04 MED ORDER — HYDROCHLOROTHIAZIDE 25 MG PO TABS
25.0000 mg | ORAL_TABLET | Freq: Every day | ORAL | Status: DC
  Administered 2013-10-04: 25 mg via ORAL
  Filled 2013-10-04: qty 1

## 2013-10-04 MED ORDER — LEVALBUTEROL HCL 1.25 MG/0.5ML IN NEBU
1.2500 mg | INHALATION_SOLUTION | RESPIRATORY_TRACT | Status: DC | PRN
  Filled 2013-10-04: qty 0.5

## 2013-10-04 MED ORDER — METHYLPREDNISOLONE SODIUM SUCC 125 MG IJ SOLR
60.0000 mg | Freq: Four times a day (QID) | INTRAMUSCULAR | Status: DC
  Administered 2013-10-04 – 2013-10-05 (×6): 60 mg via INTRAVENOUS
  Filled 2013-10-04: qty 0.96
  Filled 2013-10-04: qty 2
  Filled 2013-10-04 (×8): qty 0.96

## 2013-10-04 MED ORDER — FUROSEMIDE 10 MG/ML IJ SOLN
40.0000 mg | Freq: Once | INTRAMUSCULAR | Status: AC
  Administered 2013-10-04: 40 mg via INTRAVENOUS
  Filled 2013-10-04: qty 4

## 2013-10-04 NOTE — Progress Notes (Addendum)
[facility] PROGRESS NOTE  Jared [name] ZOX:[id number] DOB: 06/04/1956 DOA: 10/03/2013 PCP: No PCP Per Patient  Assessment/Plan  Acute hypoxic and hypercapnic respiratory failure due to community-acquired pneumonia, possible COPD exacerbation, possible new onset heart failure exacerbation. Required BiPAP initially. Increasing shortness of breath overnight. -  Continue Ceftriaxone and azithromycin -  Strep neg,  legionella antigen pending -  Sputum culture if able -  Followup blood cultures -  Start Solu-Medrol -  Start xopenex and ipra -  Start pulmicort and brovana -  Repeat chest x-ray:  Increased effusion and interstitial pulmonary edema -  Lasix 40mg  IV once  Acute systolic heart failure with acute exacerbation, EF 20-25% -  Start ACEI/BB (metop instead of carvedilol due to COPD) -  Continue lasix -  If potassium allows, eventually start spironolactone -  Continue ASA -  Cardiology consult in AM  Sinus tachycardia, likely related to SOB and nebulizer treatments -  TSH and D-dimer -  D-dimer only mildly elevated and CXR demonstrates increased vascular congestion which is a more likely explanation for his symptoms  Hyperkalemia, unclear etiology as patient did not receive any potassium last night and does not appear acidotic -  Repeat BMP -  Continue IV fluids -  Check EKG  Chest pain, may be due to pneumonia or muscle strain from coughin -  Troponins negative -  D-dimer and CTa chest if positive  Elevated BNP likely secondary to pneumonia but there was some fluid in the fissue on CXR suggestive of heart failure -  F/u ECHO -  Continue ASA and BB  HTN, blood pressures mildly elevated.   -  Continue norvasc -  Start BB and ACEI -  D/c coreg as may be increasing wheeze -  Prn metoprolol for high blood pressure  Cigarette and nicotine abuse and dependence with withdrawal -  Nicotine patch -  Counseled smoking cessation  Diet:  Healthy heart Access:  PIV IVF:   Off  Proph:  Lovenox  Code Status:  DO NOT RESUSCITATE Family Communication:  Patient alone Disposition Plan:  Pending improvement in shortness of breath, anticipate several more days   Consultants:   None  Procedures:   Chest x-ray  Echocardiogram  Antibiotics:   Ceftriaxone from 4/25  Azithromycin from 4/25   HPI/Subjective:  Increased shortness of breath and wheezing. Breathing treatments are not helping very much. Continues to have left-sided chest pressure with intermittent sharp pains, particularly with coughing   Objective: Filed Vitals:   10/03/13 2100 10/04/13 0147 10/04/13 0500 10/04/13 0659  BP: 140/81 133/81 159/99   Pulse: 90 94 116   Temp: 97.6 F (36.4 C) 97.7 F (36.5 C) 100 F (37.8 C) 99.5 F (37.5 C)  TempSrc: Oral Oral Oral Oral  Resp: 20 18 22    Height:      Weight:    94.938 kg (209 lb 4.8 oz)  SpO2: 96% 94% 93%     Intake/Output Summary (Last 24 hours) at 10/04/13 0736 Last data filed at 10/04/13 0700  Gross per 24 hour  Intake   1020 ml  Output   1176 ml  Net   -156 ml   Filed Weights   10/03/13 1130 10/04/13 0659  Weight: 93.3 kg (205 lb 11 oz) 94.938 kg (209 lb 4.8 oz)    Exam:   General:  African American male, in moderate respiratory distress with SCM retractions, subcostal retractions  HEENT:  NCAT, MMM, unable to discern JVP secondary to work of breathing  Cardiovascular:  Tachycardic, RR, nl S1, S2 no mrg, 2+ pulses, warm extremities  Respiratory:  Full expiratory wheeze, diminished throughout, faint rales at the bilateral bases, no rhonchi   Abdomen:   NABS, soft, NT/ND  MSK:   Normal tone and bulk, no LEE  Neuro:  Grossly intact  Data Reviewed: Basic Metabolic Panel:  Recent Labs Lab 10/03/13 0550 10/03/13 1200 10/04/13 0502  NA 144  --  145  K 4.1  --  6.0*  CL 103  --  103  CO2 29  --  30  GLUCOSE 116*  --  147*  BUN 16  --  20  CREATININE 1.00 0.88 1.08  CALCIUM 9.1  --  8.8   Liver Function  Tests: No results found for this basename: AST, ALT, ALKPHOS, BILITOT, PROT, ALBUMIN,  in the last 168 hours No results found for this basename: LIPASE, AMYLASE,  in the last 168 hours No results found for this basename: AMMONIA,  in the last 168 hours CBC:  Recent Labs Lab 10/03/13 0550 10/03/13 1200 10/04/13 0502  WBC 6.6 6.8 8.2  NEUTROABS 4.0  --   --   HGB 15.5 14.9 15.0  HCT 48.5 46.8 48.5  MCV 94.5 94.9 97.0  PLT 170 167 191   Cardiac Enzymes:  Recent Labs Lab 10/03/13 0550 10/03/13 1200 10/03/13 1815 10/03/13 2359  TROPONINI <0.30 <0.30 <0.30 <0.30   BNP (last 3 results)  Recent Labs  10/03/13 0550  PROBNP 1690.0*   CBG: No results found for this basename: GLUCAP,  in the last 168 hours  Recent Results (from the past 240 hour(s))  CULTURE, EXPECTORATED SPUTUM-ASSESSMENT     Status: None   Collection Time    10/04/13  2:51 AM      Result Value Ref Range Status   Specimen Description SPUTUM   Final   Special Requests NONE   Final   Sputum evaluation     Final   Value: THIS SPECIMEN IS ACCEPTABLE. RESPIRATORY CULTURE REPORT TO FOLLOW.   Report Status 10/04/2013 FINAL   Final     Studies: Dg Chest 2 View  10/03/2013   CLINICAL DATA:  Shortness of breath, chest pain and headache.  EXAM: CHEST  2 VIEW  COMPARISON:  None.  FINDINGS: The lungs are well-aerated. Patchy right-sided airspace opacity raises concern for pneumonia. There is no evidence of pleural effusion or pneumothorax.  The heart is normal in size; the mediastinal contour is within normal limits. No acute osseous abnormalities are seen.  IMPRESSION: Patchy right-sided pneumonia noted.   Electronically Signed   By: Roanna Raider M.D.   On: 10/03/2013 06:31    Scheduled Meds: . amLODipine  10 mg Oral Daily  . aspirin EC  81 mg Oral Daily  . azithromycin (ZITHROMAX) 500 MG IVPB  500 mg Intravenous Q24H  . carvedilol  6.25 mg Oral BID WC  . cefTRIAXone (ROCEPHIN)  IV  1 g Intravenous Q24H  .  heparin  5,000 Units Subcutaneous 3 times per day  . predniSONE  50 mg Oral Q breakfast   Continuous Infusions: . sodium chloride 75 mL/hr at 10/03/13 2124    Principal Problem:   Acute respiratory failure Active Problems:   CAP (community acquired pneumonia)   Hypertension    Time spent: 30 min    Renae Fickle  [facility] Pager [phone]. If 7PM-7AM, please contact night-coverage at [email or web address], password [facility] 10/04/2013, 7:36 AM  LOS: 1 day

## 2013-10-04 NOTE — Progress Notes (Signed)
ANTICOAGULATION CONSULT NOTE - Initial Consult  Pharmacy Consult for Lovenox Indication: VTE prophylaxis  No Known Allergies  Patient Measurements: Height: 6\' 4"  (193 cm) Weight: 209 lb 4.8 oz (94.938 kg) (scale b) IBW/kg (Calculated) : 86.8  Vital Signs: Temp: 99.5 F (37.5 C) (04/26 0659) Temp src: Oral (04/26 0659) BP: 159/99 mmHg (04/26 0500) Pulse Rate: 116 (04/26 0500)  Labs:  Recent Labs  10/03/13 0550 10/03/13 1200 10/03/13 1815 10/03/13 2359 10/04/13 0502  HGB 15.5 14.9  --   --  15.0  HCT 48.5 46.8  --   --  48.5  PLT 170 167  --   --  191  CREATININE 1.00 0.88  --   --  1.08  TROPONINI <0.30 <0.30 <0.30 <0.30  --     Estimated Creatinine Clearance: 90.4 ml/min (by C-G formula based on Cr of 1.08).   Medical History: Past Medical History  Diagnosis Date  . Hypertension   . Anginal pain   . Stroke   . Shortness of breath   . Headache(784.0)     Medications:  Scheduled:  . amLODipine  10 mg Oral Daily  . arformoterol  15 mcg Nebulization BID  . aspirin EC  81 mg Oral Daily  . azithromycin (ZITHROMAX) 500 MG IVPB  500 mg Intravenous Q24H  . benzonatate  200 mg Oral TID  . budesonide (PULMICORT) nebulizer solution  0.5 mg Nebulization BID  . cefTRIAXone (ROCEPHIN)  IV  1 g Intravenous Q24H  . hydrochlorothiazide  25 mg Oral Daily  . ipratropium  0.5 mg Nebulization Q4H  . levalbuterol  1.25 mg Nebulization Q4H  . methylPREDNISolone (SOLU-MEDROL) injection  60 mg Intravenous Q6H  . nicotine  21 mg Transdermal Daily    Assessment: 53 YOM admitted on 4/25 with SOB. Pharmacy has been consulted to dose Lovenox for VTE prophylaxis. CBC stable. No s/s of bleeding noted.  Goal of Therapy:  Monitor platelets by anticoagulation protocol: Yes   Plan:  - Lovenox 40mg  sq q24h - Monitor CBC and s/s of bleeding  Christiane Ha A. Lenon Ahmadi, PharmD Clinical Pharmacist - Resident Pager: [phone] Pharmacy: [phone] 10/04/2013 8:45 AM

## 2013-10-04 NOTE — Progress Notes (Signed)
  Echocardiogram 2D Echocardiogram has been performed.  Lorn Junes 10/04/2013, 11:43 AM

## 2013-10-04 NOTE — Progress Notes (Signed)
Utilization review completed.  

## 2013-10-04 NOTE — Clinical Social Work Note (Addendum)
CSW made aware of patient need for assistance with applying for Medicaid/Medicare. CSW met with patient and provided him with Medicaid/Medicare application. CSW explained to patient how to complete form. Patient verbalized understanding. RN made aware. CSW to follow-up tomorrow.  [place], [place] Weekend Clinical Social Worker [phone]

## 2013-10-05 DIAGNOSIS — J189 Pneumonia, unspecified organism: Secondary | ICD-10-CM

## 2013-10-05 DIAGNOSIS — I10 Essential (primary) hypertension: Secondary | ICD-10-CM

## 2013-10-05 DIAGNOSIS — I5023 Acute on chronic systolic (congestive) heart failure: Secondary | ICD-10-CM

## 2013-10-05 DIAGNOSIS — I509 Heart failure, unspecified: Secondary | ICD-10-CM

## 2013-10-05 DIAGNOSIS — J449 Chronic obstructive pulmonary disease, unspecified: Secondary | ICD-10-CM

## 2013-10-05 LAB — CBC
HCT: 48.3 % (ref 39.0–52.0)
Hemoglobin: 14.5 g/dL (ref 13.0–17.0)
MCH: 29.2 pg (ref 26.0–34.0)
MCHC: 30 g/dL (ref 30.0–36.0)
MCV: 97.2 fL (ref 78.0–100.0)
Platelets: 204 10*3/uL (ref 150–400)
RBC: 4.97 MIL/uL (ref 4.22–5.81)
RDW: 14.1 % (ref 11.5–15.5)
WBC: 10.4 10*3/uL (ref 4.0–10.5)

## 2013-10-05 LAB — BASIC METABOLIC PANEL
BUN: 18 mg/dL (ref 6–23)
CO2: 37 mEq/L — ABNORMAL HIGH (ref 19–32)
Calcium: 9.2 mg/dL (ref 8.4–10.5)
Chloride: 94 mEq/L — ABNORMAL LOW (ref 96–112)
Creatinine, Ser: 0.94 mg/dL (ref 0.50–1.35)
GFR calc Af Amer: >90 mL/min (ref >90)
GFR calc non Af Amer: 90 mL/min — ABNORMAL LOW (ref >90)
Glucose, Bld: 153 mg/dL — ABNORMAL HIGH (ref 70–99)
Potassium: 4.3 mEq/L (ref 3.7–5.3)
Sodium: 140 mEq/L (ref 137–147)

## 2013-10-05 LAB — TSH: TSH: 0.324 u[IU]/mL — ABNORMAL LOW (ref 0.350–4.500)

## 2013-10-05 LAB — PRO B NATRIURETIC PEPTIDE: Pro B Natriuretic peptide (BNP): 1242 pg/mL — ABNORMAL HIGH (ref 0–125)

## 2013-10-05 MED ORDER — AZITHROMYCIN 500 MG PO TABS
500.0000 mg | ORAL_TABLET | Freq: Every day | ORAL | Status: DC
  Administered 2013-10-05: 500 mg via ORAL
  Filled 2013-10-05: qty 1

## 2013-10-05 MED ORDER — LISINOPRIL 5 MG PO TABS
5.0000 mg | ORAL_TABLET | Freq: Every day | ORAL | Status: DC
  Administered 2013-10-05 – 2013-10-07 (×3): 5 mg via ORAL
  Filled 2013-10-05 (×3): qty 1

## 2013-10-05 MED ORDER — METHYLPREDNISOLONE SODIUM SUCC 125 MG IJ SOLR
60.0000 mg | Freq: Two times a day (BID) | INTRAMUSCULAR | Status: DC
  Filled 2013-10-05: qty 0.96

## 2013-10-05 MED ORDER — IPRATROPIUM BROMIDE 0.02 % IN SOLN
0.5000 mg | Freq: Four times a day (QID) | RESPIRATORY_TRACT | Status: DC
  Administered 2013-10-05 – 2013-10-08 (×11): 0.5 mg via RESPIRATORY_TRACT
  Filled 2013-10-05 (×10): qty 2.5

## 2013-10-05 MED ORDER — METOPROLOL TARTRATE 25 MG PO TABS
25.0000 mg | ORAL_TABLET | Freq: Two times a day (BID) | ORAL | Status: DC
  Administered 2013-10-05 – 2013-10-08 (×7): 25 mg via ORAL
  Filled 2013-10-05 (×8): qty 1

## 2013-10-05 MED ORDER — LEVALBUTEROL HCL 1.25 MG/0.5ML IN NEBU
1.2500 mg | INHALATION_SOLUTION | Freq: Four times a day (QID) | RESPIRATORY_TRACT | Status: DC
  Administered 2013-10-05 – 2013-10-07 (×8): 1.25 mg via RESPIRATORY_TRACT
  Filled 2013-10-05 (×12): qty 0.5

## 2013-10-05 NOTE — Progress Notes (Signed)
Nutrition Brief Note  Patient identified on the Malnutrition Screening Tool (MST) Report  Wt Readings from Last 15 Encounters:  10/05/13 201 lb 8 oz (91.4 kg)    Body mass index is 24.54 kg/(m^2). Patient meets criteria for Normal Weight based on current BMI. Pt asleep at time of first visit and out of room at time of second visit  Current diet order is Heart healthy, patient is consuming approximately 100% of all meals at this time. Labs and medications reviewed.   No nutrition interventions warranted at this time. If nutrition issues arise, please consult RD.   Ian Malkin RD, LDN Inpatient Clinical Dietitian Pager: [phone] After Hours Pager: [phone]

## 2013-10-05 NOTE — Progress Notes (Signed)
[facility] PROGRESS NOTE  Jared [name] DXI:[id number] DOB: February 17, 1956 DOA: 10/03/2013 PCP: No PCP Per Patient  Assessment/Plan  Acute hypoxic and hypercapnic respiratory failure due to community-acquired pneumonia, COPD exacerbation, acute on chronic systolic heart failure. Required BiPAP initially. Increasing shortness of breath overnight. -  Continue Ceftriaxone and azithromycin day 3 -  Strep neg & legionella antigen both neg -  Sputum culture pending -  Blood cultures NGTD -  Taper Solu-Medrol to BID -  Continue xopenex and ipra -  Start pulmicort and brovana  Acute on chronic systolic heart failure with acute exacerbation, EF 20-25% -  -2L -  Wt down 4kg -  Continue ACEI/BB (metop instead of carvedilol due to COPD) and titrate for BP -  Continue lasix -  If potassium allows, eventually start spironolactone -  Continue ASA -  Cardiology consult in AM  Sinus tachycardia, likely related to SOB and nebulizer treatments -  TSH low -  F/u fT3/fT4 -  D-dimer only mildly elevated and we have better explanations (COPD and acute heart failure exac) for his SOB  Hyperkalemia, likely spurious and corrected spontaneously  Chest pain, may be due to pneumonia or muscle strain from coughin.  Troponins negative.    HTN, blood pressures mildly elevated.   -  Continue norvasc -  Continue BB and ACEI   Cigarette and nicotine abuse and dependence with withdrawal -  Nicotine patch -  Counseled smoking cessation  Diet:  Healthy heart Access:  PIV IVF:  Off  Proph:  Lovenox  Code Status:  DO NOT RESUSCITATE Family Communication:  Patient alone Disposition Plan:  Pending improvement in shortness of breath, anticipate several more days   Consultants:   None  Procedures:   Chest x-ray  Echocardiogram  Antibiotics:   Ceftriaxone from 4/25  Azithromycin from 4/25 s/p 1.5gm  HPI/Subjective:  Chest tightness and pain are much better.  Breathing more comfortably  today  Objective: Filed Vitals:   10/05/13 0021 10/05/13 0625 10/05/13 0858 10/05/13 0900  BP: 154/90 154/92  137/80  Pulse: 92 97  97  Temp: 98.2 F (36.8 C) 98.2 F (36.8 C)    TempSrc: Oral Oral    Resp: 24 22    Height:      Weight:  91.4 kg (201 lb 8 oz)    SpO2: 98% 93% 95%     Intake/Output Summary (Last 24 hours) at 10/05/13 1428 Last data filed at 10/05/13 1200  Gross per 24 hour  Intake    960 ml  Output   4275 ml  Net  -3315 ml   Filed Weights   10/03/13 1130 10/04/13 0659 10/05/13 0625  Weight: 93.3 kg (205 lb 11 oz) 94.938 kg (209 lb 4.8 oz) 91.4 kg (201 lb 8 oz)    Exam:   General:  African American male, NAD resting in bed, 3L [place]  HEENT:  NCAT, MMM  Cardiovascular:  Tachycardic, RR, nl S1, S2 no mrg, 2+ pulses, warm extremities  Respiratory:  Full expiratory wheeze, diminished throughout, rales at the bilateral bases, no rhonchi   Abdomen:   NABS, soft, NT/ND  MSK:   Normal tone and bulk, no LEE  Neuro:  Grossly intact  Data Reviewed: Basic Metabolic Panel:  Recent Labs Lab 10/03/13 0550 10/03/13 1200 10/04/13 0502 10/04/13 0930 10/05/13 0415  NA 144  --  145 142 140  K 4.1  --  6.0* 5.1 4.3  CL 103  --  103 103 94*  CO2 29  --  30 29 37*  GLUCOSE 116*  --  147* 147* 153*  BUN 16  --  20 18 18   CREATININE 1.00 0.88 1.08 0.91 0.94  CALCIUM 9.1  --  8.8 8.6 9.2   Liver Function Tests: No results found for this basename: AST, ALT, ALKPHOS, BILITOT, PROT, ALBUMIN,  in the last 168 hours No results found for this basename: LIPASE, AMYLASE,  in the last 168 hours No results found for this basename: AMMONIA,  in the last 168 hours CBC:  Recent Labs Lab 10/03/13 0550 10/03/13 1200 10/04/13 0502 10/05/13 0415  WBC 6.6 6.8 8.2 10.4  NEUTROABS 4.0  --   --   --   HGB 15.5 14.9 15.0 14.5  HCT 48.5 46.8 48.5 48.3  MCV 94.5 94.9 97.0 97.2  PLT 170 167 191 204   Cardiac Enzymes:  Recent Labs Lab 10/03/13 0550 10/03/13 1200  10/03/13 1815 10/03/13 2359  TROPONINI <0.30 <0.30 <0.30 <0.30   BNP (last 3 results)  Recent Labs  10/03/13 0550 10/05/13 1045  PROBNP 1690.0* 1242.0*   CBG: No results found for this basename: GLUCAP,  in the last 168 hours  Recent Results (from the past 240 hour(s))  CULTURE, BLOOD (ROUTINE X 2)     Status: None   Collection Time    10/03/13  6:05 AM      Result Value Ref Range Status   Specimen Description BLOOD RIGHT FOREARM   Final   Special Requests BOTTLES DRAWN AEROBIC AND ANAEROBIC 5CC EACH   Final   Culture  Setup Time     Final   Value: 10/03/2013 13:51     Performed at [facility]   Culture     Final   Value:        BLOOD CULTURE RECEIVED NO GROWTH TO DATE CULTURE WILL BE HELD FOR 5 DAYS BEFORE ISSUING A FINAL NEGATIVE REPORT     Performed at [facility]   Report Status PENDING   Incomplete  CULTURE, BLOOD (ROUTINE X 2)     Status: None   Collection Time    10/03/13  6:10 AM      Result Value Ref Range Status   Specimen Description BLOOD LEFT HAND   Final   Special Requests BOTTLES DRAWN AEROBIC AND ANAEROBIC 5CC EACH   Final   Culture  Setup Time     Final   Value: 10/03/2013 13:51     Performed at [facility]   Culture     Final   Value:        BLOOD CULTURE RECEIVED NO GROWTH TO DATE CULTURE WILL BE HELD FOR 5 DAYS BEFORE ISSUING A FINAL NEGATIVE REPORT     Performed at [facility]   Report Status PENDING   Incomplete  CULTURE, BLOOD (ROUTINE X 2)     Status: None   Collection Time    10/03/13 11:55 AM      Result Value Ref Range Status   Specimen Description BLOOD LEFT ARM   Final   Special Requests BOTTLES DRAWN AEROBIC AND ANAEROBIC 5CC   Final   Culture  Setup Time     Final   Value: 10/03/2013 20:29     Performed at [facility]   Culture     Final   Value:        BLOOD CULTURE RECEIVED NO GROWTH TO DATE CULTURE WILL BE HELD FOR 5 DAYS BEFORE ISSUING A FINAL NEGATIVE REPORT  Performed at [facility]   Report Status PENDING   Incomplete  CULTURE, BLOOD (ROUTINE X 2)     Status: None   Collection Time    10/03/13 12:00 PM      Result Value Ref Range Status   Specimen Description BLOOD RIGHT HAND   Final   Special Requests BOTTLES DRAWN AEROBIC AND ANAEROBIC 5CC   Final   Culture  Setup Time     Final   Value: 10/03/2013 21:16     Performed at [facility]   Culture     Final   Value:        BLOOD CULTURE RECEIVED NO GROWTH TO DATE CULTURE WILL BE HELD FOR 5 DAYS BEFORE ISSUING A FINAL NEGATIVE REPORT     Performed at [facility]   Report Status PENDING   Incomplete  CULTURE, EXPECTORATED SPUTUM-ASSESSMENT     Status: None   Collection Time    10/04/13  2:51 AM      Result Value Ref Range Status   Specimen Description SPUTUM   Final   Special Requests NONE   Final   Sputum evaluation     Final   Value: THIS SPECIMEN IS ACCEPTABLE. RESPIRATORY CULTURE REPORT TO FOLLOW.   Report Status 10/04/2013 FINAL   Final  CULTURE, RESPIRATORY (NON-EXPECTORATED)     Status: None   Collection Time    10/04/13  2:51 AM      Result Value Ref Range Status   Specimen Description SPUTUM   Final   Special Requests NONE   Final   Gram Stain     Final   Value: RARE WBC PRESENT, PREDOMINANTLY MONONUCLEAR     RARE SQUAMOUS EPITHELIAL CELLS PRESENT     RARE GRAM POSITIVE RODS     RARE GRAM POSITIVE COCCI IN PAIRS     Performed at [facility]   Culture     Final   Value: Culture reincubated for better growth     Performed at [facility]   Report Status PENDING   Incomplete     Studies: Dg Chest Port 1 View  10/04/2013   CLINICAL DATA:  Wheezing, shortness of breath  EXAM: PORTABLE CHEST - 1 VIEW  COMPARISON:  Prior chest x-ray 10/03/2013  FINDINGS: Apparent interval progression of cardiomegaly likely related to portable frontal technique. However, there has been interval development of diffuse mild interstitial prominence consistent with interstitial  edema. No pleural effusion or pneumothorax. No acute osseous abnormality.  IMPRESSION: Interval development of diffuse bilateral interstitial prominence favored to reflect interstitial pulmonary edema and mild CHF.   Electronically Signed   By: Malachy Moan M.D.   On: 10/04/2013 08:55    Scheduled Meds: . amLODipine  10 mg Oral Daily  . arformoterol  15 mcg Nebulization BID  . aspirin EC  81 mg Oral Daily  . azithromycin  500 mg Oral Daily  . benzonatate  200 mg Oral TID  . budesonide (PULMICORT) nebulizer solution  0.5 mg Nebulization BID  . cefTRIAXone (ROCEPHIN)  IV  1 g Intravenous Q24H  . enoxaparin (LOVENOX) injection  40 mg Subcutaneous Q24H  . furosemide  40 mg Intravenous BID  . ipratropium  0.5 mg Nebulization Q6H  . levalbuterol  1.25 mg Nebulization 4 times per day  . lisinopril  5 mg Oral Daily  . methylPREDNISolone (SOLU-MEDROL) injection  60 mg Intravenous Q6H  . metoprolol tartrate  25 mg Oral BID  . nicotine  21 mg Transdermal Daily   Continuous Infusions:    Principal Problem:   Acute respiratory failure Active Problems:   CAP (community acquired pneumonia)   Hypertension   COPD with acute exacerbation   Acute diastolic congestive heart failure   Sinus tachycardia    Time spent: 30 min    Renae FickleMackenzie Jacqulyne Gladue  [facility] Pager [phone]. If 7PM-7AM, please contact night-coverage at [email or web address], password [email or web address] 10/05/2013, 2:28 PM  LOS: 2 days

## 2013-10-05 NOTE — Consult Note (Addendum)
Primary Physician: Primary Cardiologist:  new   HPI: Patient is a 57 yo with a history of HTN  Not on meds at home.  Went to [facility] ER with fever, cough, SOB  Patinet says he has been getting more SOB over several weeks, worse about 5 days prior to admission.  Felt feverish but did not take temp  Cough productive of clear sputum  Develped chest tightness.   Patinet was treated with BiPAP initially.  .   Patient placed on ABX.  Treated with solumedrol, xopenex, pulmicort.   Echo showed LVEF 20 to 25%  LV normal in size, mild LVH.  Started on ACE I/BB, Lasix.    The patient says he moved to [place] about 2 moonths ago  Previously lived near [place].  He says about 6 years ago he was hospitalized down there [facility])  Told heart pumping was down  Had cardiac cath  Does not remember results.  Was placed on meds but didn't take because he couldn't afford them.    Now his breathing is better from admit but not at baseline.        Past Medical History  Diagnosis Date  . Hypertension   . Anginal pain   . Stroke   . Shortness of breath   . Headache(784.0)     No prescriptions prior to admission     . amLODipine  10 mg Oral Daily  . arformoterol  15 mcg Nebulization BID  . aspirin EC  81 mg Oral Daily  . azithromycin (ZITHROMAX) 500 MG IVPB  500 mg Intravenous Q24H  . benzonatate  200 mg Oral TID  . budesonide (PULMICORT) nebulizer solution  0.5 mg Nebulization BID  . cefTRIAXone (ROCEPHIN)  IV  1 g Intravenous Q24H  . enoxaparin (LOVENOX) injection  40 mg Subcutaneous Q24H  . furosemide  40 mg Intravenous BID  . ipratropium  0.5 mg Nebulization Q4H  . levalbuterol  1.25 mg Nebulization Q4H  . lisinopril  2.5 mg Oral Daily  . methylPREDNISolone (SOLU-MEDROL) injection  60 mg Intravenous Q6H  . metoprolol tartrate  12.5 mg Oral BID  . nicotine  21 mg Transdermal Daily    Infusions:    No Known Allergies  History   Social History  . Marital Status: Married      Spouse Name: N/A    Number of Children: N/A  . Years of Education: N/A   Occupational History  . Not on file.   Social History Main Topics  . Smoking status: Current Every Day Smoker -- 2.00 packs/day    Types: Cigarettes  . Smokeless tobacco: Not on file  . Alcohol Use: Yes     Comment: occasionally  . Drug Use: No  . Sexual Activity: Yes   Other Topics Concern  . Not on file   Social History Narrative  . No narrative on file    History reviewed. No pertinent family history.  REVIEW OF SYSTEMS:  All systems reviewed  Negative to the above problem except as noted above.    PHYSICAL EXAM: Filed Vitals:   10/05/13 0625  BP: 154/92  Pulse: 97  Temp: 98.2 F (36.8 C)  Resp: 22     Intake/Output Summary (Last 24 hours) at 10/05/13 0819 Last data filed at 10/05/13 0300  Gross per 24 hour  Intake    840 ml  Output   3800 ml  Net  -2960 ml    General:  Well appearing. No respiratory  difficulty HEENT: normal Neck: supple. JVP approx 10 Carotids 2+ bilat; no bruits. No lymphadenopathy or thryomegaly appreciated. Cor: PMI nondisplaced. Regular rate & rhythm. No rubs, gallops or murmurs. Lungs: Decreased airflow  Rales at bases  Occasional wheeze Abdomen: soft, nontender, nondistended. No hepatosplenomegaly. No bruits or masses. Good bowel sounds. Extremities: no cyanosis, clubbing, rash, edema Neuro: alert & oriented x 3, cranial nerves grossly intact. moves all 4 extremities w/o difficulty. Affect pleasant.  ECG:  4/26:  ST 113.  LVH  Possible IWMI  Results for orders placed during the hospital encounter of 10/03/13 (from the past 24 hour(s))  BASIC METABOLIC PANEL     Status: Abnormal   Collection Time    10/04/13  9:30 AM      Result Value Ref Range   Sodium 142  137 - 147 mEq/L   Potassium 5.1  3.7 - 5.3 mEq/L   Chloride 103  96 - 112 mEq/L   CO2 29  19 - 32 mEq/L   Glucose, Bld 147 (*) 70 - 99 mg/dL   BUN 18  6 - 23 mg/dL   Creatinine, Ser 7.62  0.50  - 1.35 mg/dL   Calcium 8.6  8.4 - 26.3 mg/dL   GFR calc non Af Amer >90  >90 mL/min   GFR calc Af Amer >90  >90 mL/min  D-DIMER, QUANTITATIVE     Status: Abnormal   Collection Time    10/04/13  9:30 AM      Result Value Ref Range   D-Dimer, Quant 0.73 (*) 0.00 - 0.48 ug/mL-FEU  TSH     Status: Abnormal   Collection Time    10/05/13  4:15 AM      Result Value Ref Range   TSH 0.324 (*) 0.350 - 4.500 uIU/mL  BASIC METABOLIC PANEL     Status: Abnormal   Collection Time    10/05/13  4:15 AM      Result Value Ref Range   Sodium 140  137 - 147 mEq/L   Potassium 4.3  3.7 - 5.3 mEq/L   Chloride 94 (*) 96 - 112 mEq/L   CO2 37 (*) 19 - 32 mEq/L   Glucose, Bld 153 (*) 70 - 99 mg/dL   BUN 18  6 - 23 mg/dL   Creatinine, Ser 3.35  0.50 - 1.35 mg/dL   Calcium 9.2  8.4 - 45.6 mg/dL   GFR calc non Af Amer 90 (*) >90 mL/min   GFR calc Af Amer >90  >90 mL/min  CBC     Status: None   Collection Time    10/05/13  4:15 AM      Result Value Ref Range   WBC 10.4  4.0 - 10.5 K/uL   RBC 4.97  4.22 - 5.81 MIL/uL   Hemoglobin 14.5  13.0 - 17.0 g/dL   HCT 25.6  38.9 - 37.3 %   MCV 97.2  78.0 - 100.0 fL   MCH 29.2  26.0 - 34.0 pg   MCHC 30.0  30.0 - 36.0 g/dL   RDW 42.8  76.8 - 11.5 %   Platelets 204  150 - 400 K/uL   Dg Chest Port 1 View  10/04/2013   CLINICAL DATA:  Wheezing, shortness of breath  EXAM: PORTABLE CHEST - 1 VIEW  COMPARISON:  Prior chest x-ray 10/03/2013  FINDINGS: Apparent interval progression of cardiomegaly likely related to portable frontal technique. However, there has been interval development of diffuse mild interstitial prominence consistent with interstitial  edema. No pleural effusion or pneumothorax. No acute osseous abnormality.  IMPRESSION: Interval development of diffuse bilateral interstitial prominence favored to reflect interstitial pulmonary edema and mild CHF.   Electronically Signed   By: Malachy MoanHeath  McCullough M.D.   On: 10/04/2013 08:55     ASSESSMENT: Patient is a 58  yo admitted with SOB, cough  Work up revealed severely depressed LVEF    On exam, JVP is increased  Lung exam shows some rales, decreased airflow consistent with COPD Patient reports knowing that he had heart failure  Cath in past.  Not on meds  COntinues to smoke  REcomm:  Continue diuresis.  Check BNP Agree with empiric Rx for pulmonary infection though I am not convinced of this being an active problem.  Would continue steroids for now. Need to get records from [facility] (patient says it changed names) to review cath/echo  WOuld continue metoprolol and lisinopril  WOuld titrate to improve bp control  COuld back down on amlodipine and push doses further.  Patient will need long term follow up  He has risk factors for CAD but I am not convinced of an acute ischemic event as cause for presentation.  Need to review outside records  Consider myoview  2.  HTN  Folllow  3.  Tob  Counselled on cessation.  4.  HCM  Check lipids.    Pricilla RifflePaula V Ross     Addendum:   Received records from [facility]    Cardiac cath (10/11/2010)  Done for CP  Known cardiomyopathy prior.  LM normal  LAD 20% irregularities LCX 20% proximal RCA:  Dominant; 40% proximal, 40% distal LVEF 35% with diffuse hypokinesis.  11/02/10  Admitted for CP and dizziness.  R/O fro MI  Based on this would recomm continued medical care.   Optimize meds. I would not recomm any ischemic evaluations. Patient needs f/u in clinic  Has beeen noncompliant.  Pricilla RifflePaula V Ross

## 2013-10-06 LAB — CBC
HCT: 49.7 % (ref 39.0–52.0)
Hemoglobin: 15.7 g/dL (ref 13.0–17.0)
MCH: 30.2 pg (ref 26.0–34.0)
MCHC: 31.6 g/dL (ref 30.0–36.0)
MCV: 95.6 fL (ref 78.0–100.0)
Platelets: 214 10*3/uL (ref 150–400)
RBC: 5.2 MIL/uL (ref 4.22–5.81)
RDW: 13.7 % (ref 11.5–15.5)
WBC: 13 10*3/uL — ABNORMAL HIGH (ref 4.0–10.5)

## 2013-10-06 LAB — BASIC METABOLIC PANEL
BUN: 23 mg/dL (ref 6–23)
CO2: 43 mEq/L (ref 19–32)
Calcium: 9.3 mg/dL (ref 8.4–10.5)
Chloride: 94 mEq/L — ABNORMAL LOW (ref 96–112)
Creatinine, Ser: 1.03 mg/dL (ref 0.50–1.35)
GFR calc Af Amer: 90 mL/min — ABNORMAL LOW (ref >90)
GFR calc non Af Amer: 78 mL/min — ABNORMAL LOW (ref >90)
Glucose, Bld: 139 mg/dL — ABNORMAL HIGH (ref 70–99)
Potassium: 4.6 mEq/L (ref 3.7–5.3)
Sodium: 147 mEq/L (ref 137–147)

## 2013-10-06 LAB — T4, FREE: Free T4: 0.92 ng/dL (ref 0.80–1.80)

## 2013-10-06 LAB — T3, FREE: T3, Free: 2.4 pg/mL (ref 2.3–4.2)

## 2013-10-06 LAB — CULTURE, RESPIRATORY W GRAM STAIN: Culture: NORMAL

## 2013-10-06 MED ORDER — SIMVASTATIN 20 MG PO TABS
20.0000 mg | ORAL_TABLET | Freq: Every day | ORAL | Status: DC
  Administered 2013-10-06 – 2013-10-07 (×2): 20 mg via ORAL
  Filled 2013-10-06 (×3): qty 1

## 2013-10-06 MED ORDER — PREDNISONE 50 MG PO TABS
60.0000 mg | ORAL_TABLET | Freq: Every day | ORAL | Status: DC
  Administered 2013-10-06 – 2013-10-07 (×2): 60 mg via ORAL
  Filled 2013-10-06 (×3): qty 1

## 2013-10-06 MED ORDER — ACETAZOLAMIDE 250 MG PO TABS
250.0000 mg | ORAL_TABLET | Freq: Every day | ORAL | Status: DC
  Administered 2013-10-06 – 2013-10-08 (×3): 250 mg via ORAL
  Filled 2013-10-06 (×3): qty 1

## 2013-10-06 NOTE — Progress Notes (Addendum)
[facility] PROGRESS NOTE  Haynes DageJohnny Stiehl [name]:[id number] DOB: 07/18/1955 DOA: 10/03/2013 PCP: No PCP Per Patient  Brief Summary  Haynes DageJohnny [name] is a 58 y.o. male, with no known medical problems except hypertension which he was told several years ago, has not seen a physician in many years and is on no home medications. [facility] with chief complaints of fever, productive cough and shortness of breath. He is being treated for community-acquired pneumonia, COPD exacerbation and acute on chronic systolic heart failure.  Assessment/Plan  Acute hypoxic and hypercapnic respiratory failure due to community-acquired pneumonia, COPD exacerbation, acute on chronic systolic heart failure. Required BiPAP initially.  -  Continue Ceftriaxone day 4 -  Completed 1.5gm azithromycin -  Strep neg & legionella antigen both neg -  Sputum culture normal OP flora -  Blood cultures NGTD -  D/c solumedrol and start daily prednisone -  Continue xopenex and ipra -  Continue pulmicort and brovana  Acute on chronic systolic heart failure with acute exacerbation, EF 20-25%, BUN and creatinine approximately stable -  -1.4L -  Wt on admit:  93.3kg >> 90 kg -  Continue ACEI/BB (metop instead of carvedilol due to COPD) and titrate for BP -  Continue lasix -  If potassium allows, eventually start spironolactone -  Diamox added by cardiology -  Continue ASA -  Appreciate cardiology assistance  Sinus tachycardia, likely related to SOB and nebulizer treatments and resolving -  FT3/fT4 wnl -  D-dimer only mildly elevated and we have better explanations (COPD and acute heart failure exac) for his SOB  Abnl TSH:  Likely suppressed from IV steroids.  fT3/fT4 are stable -  Repeat TFTs in 4 weeks to ensure back to normal and not masking subclinical hyperthyroidism  Hyperkalemia, likely spurious and corrected spontaneously  Chest pain, may be due to pneumonia or muscle strain from coughin.   Troponins negative.    HTN, blood pressures mildly elevated.   -  Continue norvasc -  Continue BB and ACEI   Cigarette and nicotine abuse and dependence with withdrawal -  Nicotine patch -  Counseled smoking cessation  Diet:  Healthy heart Access:  PIV IVF:  Off  Proph:  Lovenox  Code Status:  DO NOT RESUSCITATE Family Communication:  Patient alone Disposition Plan:  Pending improvement in shortness of breath, anticipate several more days   Consultants:   None  Procedures:   Chest x-ray  Echocardiogram  Antibiotics:   Ceftriaxone from 4/25  Azithromycin from 4/25 s/p 1.5gm  HPI/Subjective:  Chest tightness and breathing are about the same as yesterday  Objective: Filed Vitals:   10/06/13 0516 10/06/13 0751 10/06/13 1351 10/06/13 1417  BP: 134/87  127/76   Pulse: 86  80   Temp: 97.3 F (36.3 C)  97.9 F (36.6 C)   TempSrc: Oral  Oral   Resp: 20  18   Height:      Weight: 90.13 kg (198 lb 11.2 oz)     SpO2: 94% 95% 99% 98%    Intake/Output Summary (Last 24 hours) at 10/06/13 1756 Last data filed at 10/06/13 1755  Gross per 24 hour  Intake   1320 ml  Output   2975 ml  Net  -1655 ml   Filed Weights   10/04/13 0659 10/05/13 0625 10/06/13 0516  Weight: 94.938 kg (209 lb 4.8 oz) 91.4 kg (201 lb 8 oz) 90.13 kg (198 lb 11.2 oz)    Exam:   General:  African American male, NAD resting in bed, 3L [place]  HEENT:  NCAT, MMM  Cardiovascular:  RRR, nl S1, S2 no mrg, 2+ pulses, warm extremities  Respiratory:  Full expiratory wheeze, diminished throughout, rales at the bilateral bases, no rhonchi   Abdomen:   NABS, soft, NT/ND  MSK:   Normal tone and bulk, no LEE  Neuro:  Grossly intact  Data Reviewed: Basic Metabolic Panel:  Recent Labs Lab 10/03/13 0550 10/03/13 1200 10/04/13 0502 10/04/13 0930 10/05/13 0415 10/06/13 0515  NA 144  --  145 142 140 147  K 4.1  --  6.0* 5.1 4.3 4.6  CL 103  --  103 103 94* 94*  CO2 29  --  30 29 37* 43*   GLUCOSE 116*  --  147* 147* 153* 139*  BUN 16  --  20 18 18 23   CREATININE 1.00 0.88 1.08 0.91 0.94 1.03  CALCIUM 9.1  --  8.8 8.6 9.2 9.3   Liver Function Tests: No results found for this basename: AST, ALT, ALKPHOS, BILITOT, PROT, ALBUMIN,  in the last 168 hours No results found for this basename: LIPASE, AMYLASE,  in the last 168 hours No results found for this basename: AMMONIA,  in the last 168 hours CBC:  Recent Labs Lab 10/03/13 0550 10/03/13 1200 10/04/13 0502 10/05/13 0415 10/06/13 0515  WBC 6.6 6.8 8.2 10.4 13.0*  NEUTROABS 4.0  --   --   --   --   HGB 15.5 14.9 15.0 14.5 15.7  HCT 48.5 46.8 48.5 48.3 49.7  MCV 94.5 94.9 97.0 97.2 95.6  PLT 170 167 191 204 214   Cardiac Enzymes:  Recent Labs Lab 10/03/13 0550 10/03/13 1200 10/03/13 1815 10/03/13 2359  TROPONINI <0.30 <0.30 <0.30 <0.30   BNP (last 3 results)  Recent Labs  10/03/13 0550 10/05/13 1045  PROBNP 1690.0* 1242.0*   CBG: No results found for this basename: GLUCAP,  in the last 168 hours  Recent Results (from the past 240 hour(s))  CULTURE, BLOOD (ROUTINE X 2)     Status: None   Collection Time    10/03/13  6:05 AM      Result Value Ref Range Status   Specimen Description BLOOD RIGHT FOREARM   Final   Special Requests BOTTLES DRAWN AEROBIC AND ANAEROBIC 5CC EACH   Final   Culture  Setup Time     Final   Value: 10/03/2013 13:51     Performed at [facility]   Culture     Final   Value:        BLOOD CULTURE RECEIVED NO GROWTH TO DATE CULTURE WILL BE HELD FOR 5 DAYS BEFORE ISSUING A FINAL NEGATIVE REPORT     Performed at [facility]   Report Status PENDING   Incomplete  CULTURE, BLOOD (ROUTINE X 2)     Status: None   Collection Time    10/03/13  6:10 AM      Result Value Ref Range Status   Specimen Description BLOOD LEFT HAND   Final   Special Requests BOTTLES DRAWN AEROBIC AND ANAEROBIC 5CC EACH   Final   Culture  Setup Time     Final   Value: 10/03/2013 13:51      Performed at [facility]   Culture     Final   Value:        BLOOD CULTURE RECEIVED NO GROWTH TO DATE CULTURE WILL BE HELD FOR 5 DAYS BEFORE ISSUING A  FINAL NEGATIVE REPORT     Performed at [facility]   Report Status PENDING   Incomplete  CULTURE, BLOOD (ROUTINE X 2)     Status: None   Collection Time    10/03/13 11:55 AM      Result Value Ref Range Status   Specimen Description BLOOD LEFT ARM   Final   Special Requests BOTTLES DRAWN AEROBIC AND ANAEROBIC 5CC   Final   Culture  Setup Time     Final   Value: 10/03/2013 20:29     Performed at [facility]   Culture     Final   Value:        BLOOD CULTURE RECEIVED NO GROWTH TO DATE CULTURE WILL BE HELD FOR 5 DAYS BEFORE ISSUING A FINAL NEGATIVE REPORT     Performed at [facility]   Report Status PENDING   Incomplete  CULTURE, BLOOD (ROUTINE X 2)     Status: None   Collection Time    10/03/13 12:00 PM      Result Value Ref Range Status   Specimen Description BLOOD RIGHT HAND   Final   Special Requests BOTTLES DRAWN AEROBIC AND ANAEROBIC 5CC   Final   Culture  Setup Time     Final   Value: 10/03/2013 21:16     Performed at [facility]   Culture     Final   Value:        BLOOD CULTURE RECEIVED NO GROWTH TO DATE CULTURE WILL BE HELD FOR 5 DAYS BEFORE ISSUING A FINAL NEGATIVE REPORT     Performed at [facility]   Report Status PENDING   Incomplete  CULTURE, EXPECTORATED SPUTUM-ASSESSMENT     Status: None   Collection Time    10/04/13  2:51 AM      Result Value Ref Range Status   Specimen Description SPUTUM   Final   Special Requests NONE   Final   Sputum evaluation     Final   Value: THIS SPECIMEN IS ACCEPTABLE. RESPIRATORY CULTURE REPORT TO FOLLOW.   Report Status 10/04/2013 FINAL   Final  CULTURE, RESPIRATORY (NON-EXPECTORATED)     Status: None   Collection Time    10/04/13  2:51 AM      Result Value Ref Range Status   Specimen Description SPUTUM   Final   Special Requests  NONE   Final   Gram Stain     Final   Value: RARE WBC PRESENT, PREDOMINANTLY MONONUCLEAR     RARE SQUAMOUS EPITHELIAL CELLS PRESENT     RARE GRAM POSITIVE RODS     RARE GRAM POSITIVE COCCI IN PAIRS     Performed at [facility]   Culture     Final   Value: NORMAL OROPHARYNGEAL FLORA     Performed at [facility]   Report Status 10/06/2013 FINAL   Final     Studies: No results found.  Scheduled Meds: . acetaZOLAMIDE  250 mg Oral Daily  . amLODipine  10 mg Oral Daily  . arformoterol  15 mcg Nebulization BID  . aspirin EC  81 mg Oral Daily  . benzonatate  200 mg Oral TID  . budesonide (PULMICORT) nebulizer solution  0.5 mg Nebulization BID  . cefTRIAXone (ROCEPHIN)  IV  1 g Intravenous Q24H  . enoxaparin (LOVENOX) injection  40 mg Subcutaneous Q24H  . furosemide  40 mg Intravenous BID  . ipratropium  0.5 mg Nebulization Q6H  .  levalbuterol  1.25 mg Nebulization 4 times per day  . lisinopril  5 mg Oral Daily  . metoprolol tartrate  25 mg Oral BID  . nicotine  21 mg Transdermal Daily  . predniSONE  60 mg Oral Q breakfast  . simvastatin  20 mg Oral q1800   Continuous Infusions:    Principal Problem:   Acute respiratory failure Active Problems:   CAP (community acquired pneumonia)   Hypertension   COPD with acute exacerbation   Acute diastolic congestive heart failure   Sinus tachycardia    Time spent: 30 min    Renae Fickle  [facility] Pager [phone]. If 7PM-7AM, please contact night-coverage at [email or web address], password [facility] 10/06/2013, 5:56 PM  LOS: 3 days

## 2013-10-06 NOTE — Progress Notes (Signed)
SUBJECTIVE:  Feeling better  OBJECTIVE:   Vitals:   Filed Vitals:   10/05/13 1507 10/05/13 2051 10/06/13 0516 10/06/13 0751  BP: 138/84 142/77 134/87   Pulse: 86 94 86   Temp: 98 F (36.7 C) 98 F (36.7 C) 97.3 F (36.3 C)   TempSrc: Oral Oral Oral   Resp: 20 20 20    Height:      Weight:   198 lb 11.2 oz (90.13 kg)   SpO2: 98% 98% 94% 95%   I&O's:   Intake/Output Summary (Last 24 hours) at 10/06/13 0934 Last data filed at 10/06/13 0831  Gross per 24 hour  Intake    720 ml  Output   2250 ml  Net  -1530 ml   TELEMETRY: Reviewed telemetry pt in NSR     PHYSICAL EXAM General: Well developed, well nourished, in no acute distress Head: Eyes PERRLA, No xanthomas.   Normal cephalic and atramatic  Lungs:   Crackles at bases and decreased BS  Heart:  RRR with no M/R/G Abdomen: Bowel sounds are positive, abdomen soft and non-tender without masses  Extremities:   No clubbing, cyanosis or edema.  DP +1 Neuro: Alert and oriented X 3. Psych:  Good affect, responds appropriately   LABS: Basic Metabolic Panel:  Recent Labs  62/83/15 0415 10/06/13 0515  NA 140 147  K 4.3 4.6  CL 94* 94*  CO2 37* 43*  GLUCOSE 153* 139*  BUN 18 23  CREATININE 0.94 1.03  CALCIUM 9.2 9.3   Liver Function Tests: No results found for this basename: AST, ALT, ALKPHOS, BILITOT, PROT, ALBUMIN,  in the last 72 hours No results found for this basename: LIPASE, AMYLASE,  in the last 72 hours CBC:  Recent Labs  10/05/13 0415 10/06/13 0515  WBC 10.4 13.0*  HGB 14.5 15.7  HCT 48.3 49.7  MCV 97.2 95.6  PLT 204 214   Cardiac Enzymes:  Recent Labs  10/03/13 1200 10/03/13 1815 10/03/13 2359  TROPONINI <0.30 <0.30 <0.30   BNP: No components found with this basename: POCBNP,  D-Dimer:  Recent Labs  10/04/13 0930  DDIMER 0.73*   Hemoglobin A1C:  Recent Labs  10/03/13 1200  HGBA1C 6.2*   Fasting Lipid Panel:  Recent Labs  10/04/13 0502  CHOL 170  HDL 51  LDLCALC 106*    TRIG 65  CHOLHDL 3.3   Thyroid Function Tests:  Recent Labs  10/05/13 0415  TSH 0.324*   Anemia Panel: No results found for this basename: VITAMINB12, FOLATE, FERRITIN, TIBC, IRON, RETICCTPCT,  in the last 72 hours Coag Panel:   No results found for this basename: INR, PROTIME    RADIOLOGY: Dg Chest 2 View  10/03/2013   CLINICAL DATA:  Shortness of breath, chest pain and headache.  EXAM: CHEST  2 VIEW  COMPARISON:  None.  FINDINGS: The lungs are well-aerated. Patchy right-sided airspace opacity raises concern for pneumonia. There is no evidence of pleural effusion or pneumothorax.  The heart is normal in size; the mediastinal contour is within normal limits. No acute osseous abnormalities are seen.  IMPRESSION: Patchy right-sided pneumonia noted.   Electronically Signed   By: Roanna Raider M.D.   On: 10/03/2013 06:31   Dg Chest Port 1 View  10/04/2013   CLINICAL DATA:  Wheezing, shortness of breath  EXAM: PORTABLE CHEST - 1 VIEW  COMPARISON:  Prior chest x-ray 10/03/2013  FINDINGS: Apparent interval progression of cardiomegaly likely related to portable frontal technique. However, there has been  interval development of diffuse mild interstitial prominence consistent with interstitial edema. No pleural effusion or pneumothorax. No acute osseous abnormality.  IMPRESSION: Interval development of diffuse bilateral interstitial prominence favored to reflect interstitial pulmonary edema and mild CHF.   Electronically Signed   By: Malachy MoanHeath  McCullough M.D.   On: 10/04/2013 08:55   ASSESSMENT:  Patient is a 58 yo admitted with SOB, cough. Work up revealed severely depressed LVEF  Patient reports knowing that he had heart failure Cath in past. Cath records obtained showing 20% irregularities in the LAD/LCX and 40% prox/distal RCA with EF 35%.  Not on meds Continues to smoke  Agree with empiric Rx for pulmonary infection though I am not convinced of this being an active problem. Would continue  steroids for now.  1.  Acute on chronic systolic CHF. He is now -3.5L and weight down 11lbs - Continue metoprolol and lisinopril  - no further ischemic workup indicated at this time since EF was depressed in the past in setting of nonobstructive ASCAD.   - continue IV Lasix - CO2 increased so will add Diamox 250mg  daily - follow BMET daily 2. HTN Folllow - continue amlodipine/beta blocker/ACE I 3. Tob Counseled on cessation.  4. Dyslipidemia - LDL not at goal of <70. - start Zocor 20mg  daily - check LFTs   Quintella Reichertraci R Kioni Stahl, MD  10/06/2013  9:34 AM

## 2013-10-06 NOTE — Progress Notes (Signed)
CSW met with patient today to further discuss his questions re: Medicare/Medicaid.  CSW spoke to [employer] who determined that patient does have [place] Medicaid.  Patient relates that he was visited by a financial counselor today who indicated that he currently only has "Family Medicaid" and she will follow up to assist patient to obtain full Medicaid. Patient currently does not receive disability and states that prior to this hospitalization he was working.  He relates that due to his medical conditions- he does not feel that he can continue to maintain his work schedule as he works as a Development worker, international aid.  CSW will contact the [facility] to determine if a disability application can be initiated for patient while he is in the hospital. Also discussed criteria to receive Medicare.  Patient was able to verbalize understanding of above and was appreciative of CSW's visit and follow up.  Lorie Phenix. [place], [place]

## 2013-10-06 NOTE — Progress Notes (Signed)
CRITICAL VALUE ALERT  Critical value received: CO2=43  Date of notification:  10/06/2013  Time of notification:  0645  Critical value read back: yes  Nurse who received alert:  G. Custer Pimenta  MD notified (1st page):  Merdis Delay NP  Time of first page:  [phone]  MD notified (2nd page):  Time of second page:  Responding MD:  Awaiting callback  Time MD responded:  **

## 2013-10-07 DIAGNOSIS — I471 Supraventricular tachycardia: Secondary | ICD-10-CM

## 2013-10-07 DIAGNOSIS — I420 Dilated cardiomyopathy: Secondary | ICD-10-CM | POA: Diagnosis present

## 2013-10-07 DIAGNOSIS — I2510 Atherosclerotic heart disease of native coronary artery without angina pectoris: Secondary | ICD-10-CM | POA: Diagnosis present

## 2013-10-07 DIAGNOSIS — I428 Other cardiomyopathies: Secondary | ICD-10-CM

## 2013-10-07 LAB — BASIC METABOLIC PANEL
BUN: 26 mg/dL — ABNORMAL HIGH (ref 6–23)
CO2: 36 mEq/L — ABNORMAL HIGH (ref 19–32)
Calcium: 9.2 mg/dL (ref 8.4–10.5)
Chloride: 99 mEq/L (ref 96–112)
Creatinine, Ser: 0.95 mg/dL (ref 0.50–1.35)
GFR calc Af Amer: >90 mL/min (ref >90)
GFR calc non Af Amer: 89 mL/min — ABNORMAL LOW (ref >90)
Glucose, Bld: 109 mg/dL — ABNORMAL HIGH (ref 70–99)
Potassium: 4 mEq/L (ref 3.7–5.3)
Sodium: 145 mEq/L (ref 137–147)

## 2013-10-07 LAB — CBC
HCT: 52.4 % — ABNORMAL HIGH (ref 39.0–52.0)
Hemoglobin: 16.4 g/dL (ref 13.0–17.0)
MCH: 29.6 pg (ref 26.0–34.0)
MCHC: 31.3 g/dL (ref 30.0–36.0)
MCV: 94.6 fL (ref 78.0–100.0)
Platelets: 219 10*3/uL (ref 150–400)
RBC: 5.54 MIL/uL (ref 4.22–5.81)
RDW: 13.4 % (ref 11.5–15.5)
WBC: 11.8 10*3/uL — ABNORMAL HIGH (ref 4.0–10.5)

## 2013-10-07 LAB — HEPATIC FUNCTION PANEL
ALT: 49 U/L (ref 0–53)
AST: 46 U/L — ABNORMAL HIGH (ref 0–37)
Albumin: 3.3 g/dL — ABNORMAL LOW (ref 3.5–5.2)
Alkaline Phosphatase: 85 U/L (ref 39–117)
Bilirubin, Direct: <0.2 mg/dL (ref 0.0–0.3)
Total Bilirubin: 0.3 mg/dL (ref 0.3–1.2)
Total Protein: 7.1 g/dL (ref 6.0–8.3)

## 2013-10-07 MED ORDER — LISINOPRIL 20 MG PO TABS
20.0000 mg | ORAL_TABLET | Freq: Every day | ORAL | Status: DC
  Administered 2013-10-08: 20 mg via ORAL
  Filled 2013-10-07: qty 1

## 2013-10-07 MED ORDER — TIOTROPIUM BROMIDE MONOHYDRATE 18 MCG IN CAPS
18.0000 ug | ORAL_CAPSULE | Freq: Every day | RESPIRATORY_TRACT | Status: DC
  Administered 2013-10-08: 18 ug via RESPIRATORY_TRACT
  Filled 2013-10-07: qty 5

## 2013-10-07 MED ORDER — FUROSEMIDE 40 MG PO TABS
40.0000 mg | ORAL_TABLET | Freq: Every day | ORAL | Status: DC
  Administered 2013-10-07 – 2013-10-08 (×2): 40 mg via ORAL
  Filled 2013-10-07 (×2): qty 1

## 2013-10-07 MED ORDER — LEVOFLOXACIN 750 MG PO TABS
750.0000 mg | ORAL_TABLET | Freq: Every day | ORAL | Status: DC
  Administered 2013-10-07 – 2013-10-08 (×2): 750 mg via ORAL
  Filled 2013-10-07 (×2): qty 1

## 2013-10-07 MED ORDER — PREDNISONE 50 MG PO TABS
50.0000 mg | ORAL_TABLET | Freq: Every day | ORAL | Status: DC
  Administered 2013-10-08: 50 mg via ORAL
  Filled 2013-10-07 (×2): qty 1

## 2013-10-07 MED ORDER — BUDESONIDE-FORMOTEROL FUMARATE 160-4.5 MCG/ACT IN AERO
2.0000 | INHALATION_SPRAY | Freq: Two times a day (BID) | RESPIRATORY_TRACT | Status: DC
  Administered 2013-10-07 – 2013-10-08 (×2): 2 via RESPIRATORY_TRACT
  Filled 2013-10-07: qty 6

## 2013-10-07 MED ORDER — TIOTROPIUM BROMIDE MONOHYDRATE 18 MCG IN CAPS
18.0000 ug | ORAL_CAPSULE | Freq: Every day | RESPIRATORY_TRACT | Status: DC
  Filled 2013-10-07: qty 5

## 2013-10-07 MED ORDER — AZITHROMYCIN 500 MG PO TABS: 500.0000 mg | ORAL_TABLET | Freq: Every day | ORAL | Status: DC

## 2013-10-07 MED ORDER — SPIRONOLACTONE 12.5 MG HALF TABLET
12.5000 mg | ORAL_TABLET | Freq: Every day | ORAL | Status: DC
  Administered 2013-10-07 – 2013-10-08 (×2): 12.5 mg via ORAL
  Filled 2013-10-07 (×2): qty 1

## 2013-10-07 MED ORDER — ALBUTEROL SULFATE (2.5 MG/3ML) 0.083% IN NEBU: 3.0000 mL | INHALATION_SOLUTION | RESPIRATORY_TRACT | Status: DC | PRN

## 2013-10-07 NOTE — Progress Notes (Signed)
SATURATION QUALIFICATIONS: (This note is used to comply with regulatory documentation for home oxygen)  Patient Saturations on Room Air at Rest = 98%  Patient Saturations on Room Air while Ambulating = 85%  Patient Saturations on 2 Liters of oxygen while Ambulating = 93%  Please briefly explain why patient needs home oxygen: Pt needs oxygen when ambulating.  Pt o2 sats drop to 85% while ambulating on RA.

## 2013-10-07 NOTE — Progress Notes (Addendum)
SUBJECTIVE:  Feels better  OBJECTIVE:   Vitals:   Filed Vitals:   10/06/13 2014 10/06/13 2107 10/07/13 0130 10/07/13 0456  BP:  110/75  126/89  Pulse:  87  78  Temp:  98 F (36.7 C)  98.1 F (36.7 C)  TempSrc:  Oral  Oral  Resp:  18  18  Height:      Weight:    196 lb (88.905 kg)  SpO2: 97% 100% 97% 95%   I&O's:   Intake/Output Summary (Last 24 hours) at 10/07/13 0908 Last data filed at 10/07/13 [id number]  Gross per 24 hour  Intake   1560 ml  Output   3050 ml  Net  -1490 ml   TELEMETRY: Reviewed telemetry pt in NSR with 7 beat run of nonsustained atrial tachcyardia     PHYSICAL EXAM General: Well developed, well nourished, in no acute distress Head: Eyes PERRLA, No xanthomas.   Normal cephalic and atramatic  Lungs:  Decreased BS at bases with faint crackles Heart:   HRRR S1 S2 Pulses are 2+ & equal. Abdomen: Bowel sounds are positive, abdomen soft and non-tender without masses  Extremities:   No clubbing, cyanosis or edema.  DP +1 Neuro: Alert and oriented X 3. Psych:  Good affect, responds appropriately   LABS: Basic Metabolic Panel:  Recent Labs  96/09/5402/28/15 0515 10/07/13 0730  NA 147 145  K 4.6 4.0  CL 94* 99  CO2 43* 36*  GLUCOSE 139* 109*  BUN 23 26*  CREATININE 1.03 0.95  CALCIUM 9.3 9.2   Liver Function Tests: No results found for this basename: AST, ALT, ALKPHOS, BILITOT, PROT, ALBUMIN,  in the last 72 hours No results found for this basename: LIPASE, AMYLASE,  in the last 72 hours CBC:  Recent Labs  10/06/13 0515 10/07/13 0730  WBC 13.0* 11.8*  HGB 15.7 16.4  HCT 49.7 52.4*  MCV 95.6 94.6  PLT 214 219   Cardiac Enzymes: No results found for this basename: CKTOTAL, CKMB, CKMBINDEX, TROPONINI,  in the last 72 hours BNP: No components found with this basename: POCBNP,  D-Dimer:  Recent Labs  10/04/13 0930  DDIMER 0.73*   Hemoglobin A1C: No results found for this basename: HGBA1C,  in the last 72 hours Fasting Lipid Panel: No  results found for this basename: CHOL, HDL, LDLCALC, TRIG, CHOLHDL, LDLDIRECT,  in the last 72 hours Thyroid Function Tests:  Recent Labs  10/05/13 0415 10/06/13 0515  TSH 0.324*  --   T3FREE  --  2.4   Anemia Panel: No results found for this basename: VITAMINB12, FOLATE, FERRITIN, TIBC, IRON, RETICCTPCT,  in the last 72 hours Coag Panel:   No results found for this basename: INR, PROTIME    RADIOLOGY: Dg Chest 2 View  10/03/2013   CLINICAL DATA:  Shortness of breath, chest pain and headache.  EXAM: CHEST  2 VIEW  COMPARISON:  None.  FINDINGS: The lungs are well-aerated. Patchy right-sided airspace opacity raises concern for pneumonia. There is no evidence of pleural effusion or pneumothorax.  The heart is normal in size; the mediastinal contour is within normal limits. No acute osseous abnormalities are seen.  IMPRESSION: Patchy right-sided pneumonia noted.   Electronically Signed   By: Roanna RaiderJeffery  Chang M.D.   On: 10/03/2013 06:31   Dg Chest Port 1 View  10/04/2013   CLINICAL DATA:  Wheezing, shortness of breath  EXAM: PORTABLE CHEST - 1 VIEW  COMPARISON:  Prior chest x-ray 10/03/2013  FINDINGS: Apparent interval progression  of cardiomegaly likely related to portable frontal technique. However, there has been interval development of diffuse mild interstitial prominence consistent with interstitial edema. No pleural effusion or pneumothorax. No acute osseous abnormality.  IMPRESSION: Interval development of diffuse bilateral interstitial prominence favored to reflect interstitial pulmonary edema and mild CHF.   Electronically Signed   By: Malachy Moan M.D.   On: 10/04/2013 08:55   ASSESSMENT:  Patient is a 58 yo admitted with SOB, cough. Work up revealed severely depressed LVEF  Patient reports knowing that he had heart failure Cath in past. Cath records obtained showing 20% irregularities in the LAD/LCX and 40% prox/distal RCA with EF 35%. Not on meds at home and continues to smoke    Agree with empiric Rx for pulmonary infection though I am not convinced of this being an active problem. Would continue steroids for now.  1. Acute on chronic systolic CHF. He is now -4.9L and weight down 13lbs  - Continue metoprolol and lisinopril  - no further ischemic workup indicated at this time since EF was depressed in the past in setting of nonobstructive ASCAD.  - continue IV Lasix - CO2 increased so will add Diamox 250mg  daily.  CO2 improved today at 36.  Would continue IV Lasix one more day and switch to PO.  Recheck BNP - follow BMET daily  2. HTN [place]  - continue amlodipine/beta blocker/ACE I  3. Tob Counseled on cessation.  4. Dyslipidemia - LDL not at goal of <70.  - start Zocor 20mg  daily  - check LFTs        Quintella Reichert, MD  10/07/2013  9:08 AM

## 2013-10-07 NOTE — Care Management Note (Addendum)
    Page 1 of 2   10/08/2013     4:20:58 PM CARE MANAGEMENT NOTE 10/08/2013  Patient:  Jared [name], Jared [name]   Account Number:  [id number]  Date Initiated:  10/07/2013  Documentation initiated by:  [facility]  Subjective/Objective Assessment:   58 y.o. male, with no known med hx except HTN which he was told several years ago, has not seen a MD in many years and is on no home medications. Presented to the ER with chief complaints of fever, SOB, productive cough.//Home with sister     Action/Plan:   Plan: admit him to telemetry bed, oral steroids. Will obtain blood and sputum cultures, HIV serology, strep pneumo and Legionella antigen and IV antibiotics.//Home with spouse   Anticipated DC Date:  10/08/2013   Anticipated DC Plan:  [place]  CM consult  Patient refused services  [place] Program  Medication Assistance  HF Clinic      [facility] Choice  NA   Choice offered to / List presented to:  C-1 Patient        [place] arranged  [place] - 11 Patient Refused      Status of service:  In process, will continue to follow Medicare Important Message given?   (If response is "NO", the following Medicare IM given date fields will be blank) Date Medicare IM given:   Date Additional Medicare IM given:    Discharge Disposition:  HOME/SELF CARE  Per UR Regulation:  Reviewed for med. necessity/level of care/duration of stay  If discussed at [place] of Stay Meetings, dates discussed:   10/08/2013    Comments:  10/08/13 [place], RN, [place] [phone] Approved to set pt up with [facility] program r/t patient having Medicaid Family Coverage only (does not help with medications).  Pt has appt with DSS to apply for additional medication coverage.  10/07/13 [place], RN, BSN, [place] [phone] CSW met with patient 4/28 to further discuss his questions re: Medicare/Medicaid.  CSW spoke to [employer] who determined that patient does have [place]  Medicaid.  Patient relates that he was visited by a financial counselor 4/28 who indicated that he currently only has "Family Medicaid" and she will follow up to assist patient to obtain full Medicaid. Financial Counselor Rhonda rounds on pt 4/29 to assist will Medicaid Financial papers.  10/03/13 Tamura Lasky,RN, BSN, NCM [phone] I have recommended that this patient have [place] services but he declines at this time. I have discussed the benefits of Home Health with him. The patient verbalizes understanding.

## 2013-10-07 NOTE — Progress Notes (Signed)
[facility] PROGRESS NOTE Interim History: Jared [name] is a 58 y.o. male, with no known medical problems except hypertension which he was told several years ago, has not seen a physician in many years and is on no home medications. [facility] with chief complaints of fever, productive cough and shortness of breath. He is being treated for community-acquired pneumonia, COPD exacerbation and acute on chronic systolic heart failure.   Assessment/Plan  Acute hypoxic and hypercapnic respiratory failure due to community-acquired pneumonia, COPD exacerbation, acute on chronic systolic heart failure. - Required BiPAP initially.  - Change to levaquin. - Strep neg & legionella antigen both neg  - D/c solumedrol and start daily prednisone  - change to symbicort and Spiriva, albuterol PRN  Acute on chronic systolic heart failure with acute exacerbation, EF 20-25%: - Moderate UOP. - Wt on admit: 93.3kg >> 90->88  kg  - Continue ACEI/BB  and titrate for BP. - Change lasix to orals. - Add low dose spironolactone. - Continue ASA  - d/c diamox, d/c norvasc.  Sinus tachycardia: - likely related to SOB and nebulizer treatments and resolving   Abnl TSH: Likely suppressed from IV steroids. fT3/fT4 are stable  - Repeat TFTs in 4 weeks to ensure back to normal and not masking subclinical hyperthyroidism   Hyperkalemia: -  likely spurious and corrected spontaneously   HTN, blood pressures mildly elevated.  - d/c norvasc norvasc  - Continue to increase BB and ACEI.  Cigarette and nicotine abuse and dependence with withdrawal: - Nicotine patch  - Counseled smoking cessation   Diet: Healthy heart  Access: PIV  IVF: Off  Proph: Lovenox   Code Status: DO NOT RESUSCITATE  Family Communication: Patient alone  Disposition Plan: Pending improvement in shortness of breath, anticipate several more days   Consultants:  None Procedures:  Chest x-ray    Echocardiogram Antibiotics:  Ceftriaxone from 4/25  Azithromycin from 4/25 s/p 1.5gm  HPI/Subjective: No complains  Objective: Filed Vitals:   10/07/13 0130 10/07/13 0456 10/07/13 0947 10/07/13 1402  BP:  126/89 142/68 132/81  Pulse:  78  76  Temp:  98.1 F (36.7 C)  97.8 F (36.6 C)  TempSrc:  Oral  Oral  Resp:  18  20  Height:      Weight:  88.905 kg (196 lb)    SpO2: 97% 95%  96%    Intake/Output Summary (Last 24 hours) at 10/07/13 1420 Last data filed at 10/07/13 1331  Gross per 24 hour  Intake   1200 ml  Output   2000 ml  Net   -800 ml   Filed Weights   10/05/13 0625 10/06/13 0516 10/07/13 0456  Weight: 91.4 kg (201 lb 8 oz) 90.13 kg (198 lb 11.2 oz) 88.905 kg (196 lb)    Exam:  General: Alert, awake, oriented x3, in no acute distress.  HEENT: No bruits, no goiter. -JVD Heart: Regular rate and rhythm, without murmurs, rubs, gallops.  Lungs: Good air movement, clear. Abdomen: Soft, nontender, nondistended, positive bowel sounds.     Data Reviewed: Basic Metabolic Panel:  Recent Labs Lab 10/04/13 0502 10/04/13 0930 10/05/13 0415 10/06/13 0515 10/07/13 0730  NA 145 142 140 147 145  K 6.0* 5.1 4.3 4.6 4.0  CL 103 103 94* 94* 99  CO2 30 29 37* 43* 36*  GLUCOSE 147* 147* 153* 139* 109*  BUN 20 18 18 23  26*  CREATININE 1.08 0.91 0.94 1.03 0.95  CALCIUM 8.8 8.6  9.2 9.3 9.2   Liver Function Tests:  Recent Labs Lab 10/07/13 0730  AST 46*  ALT 49  ALKPHOS 85  BILITOT 0.3  PROT 7.1  ALBUMIN 3.3*   No results found for this basename: LIPASE, AMYLASE,  in the last 168 hours No results found for this basename: AMMONIA,  in the last 168 hours CBC:  Recent Labs Lab 10/03/13 0550 10/03/13 1200 10/04/13 0502 10/05/13 0415 10/06/13 0515 10/07/13 0730  WBC 6.6 6.8 8.2 10.4 13.0* 11.8*  NEUTROABS 4.0  --   --   --   --   --   HGB 15.5 14.9 15.0 14.5 15.7 16.4  HCT 48.5 46.8 48.5 48.3 49.7 52.4*  MCV 94.5 94.9 97.0 97.2 95.6 94.6  PLT 170  167 191 204 214 219   Cardiac Enzymes:  Recent Labs Lab 10/03/13 0550 10/03/13 1200 10/03/13 1815 10/03/13 2359  TROPONINI <0.30 <0.30 <0.30 <0.30   BNP (last 3 results)  Recent Labs  10/03/13 0550 10/05/13 1045  PROBNP 1690.0* 1242.0*   CBG: No results found for this basename: GLUCAP,  in the last 168 hours  Recent Results (from the past 240 hour(s))  CULTURE, BLOOD (ROUTINE X 2)     Status: None   Collection Time    10/03/13  6:05 AM      Result Value Ref Range Status   Specimen Description BLOOD RIGHT FOREARM   Final   Special Requests BOTTLES DRAWN AEROBIC AND ANAEROBIC 5CC EACH   Final   Culture  Setup Time     Final   Value: 10/03/2013 13:51     Performed at [facility]   Culture     Final   Value:        BLOOD CULTURE RECEIVED NO GROWTH TO DATE CULTURE WILL BE HELD FOR 5 DAYS BEFORE ISSUING A FINAL NEGATIVE REPORT     Performed at [facility]   Report Status PENDING   Incomplete  CULTURE, BLOOD (ROUTINE X 2)     Status: None   Collection Time    10/03/13  6:10 AM      Result Value Ref Range Status   Specimen Description BLOOD LEFT HAND   Final   Special Requests BOTTLES DRAWN AEROBIC AND ANAEROBIC 5CC EACH   Final   Culture  Setup Time     Final   Value: 10/03/2013 13:51     Performed at [facility]   Culture     Final   Value:        BLOOD CULTURE RECEIVED NO GROWTH TO DATE CULTURE WILL BE HELD FOR 5 DAYS BEFORE ISSUING A FINAL NEGATIVE REPORT     Performed at [facility]   Report Status PENDING   Incomplete  CULTURE, BLOOD (ROUTINE X 2)     Status: None   Collection Time    10/03/13 11:55 AM      Result Value Ref Range Status   Specimen Description BLOOD LEFT ARM   Final   Special Requests BOTTLES DRAWN AEROBIC AND ANAEROBIC 5CC   Final   Culture  Setup Time     Final   Value: 10/03/2013 20:29     Performed at [facility]   Culture     Final   Value:        BLOOD CULTURE RECEIVED NO GROWTH TO DATE  CULTURE WILL BE HELD FOR 5 DAYS BEFORE ISSUING A FINAL NEGATIVE REPORT     [facility]formed at  [facility]   Report Status PENDING   Incomplete  CULTURE, BLOOD (ROUTINE X 2)     Status: None   Collection Time    10/03/13 12:00 PM      Result Value Ref Range Status   Specimen Description BLOOD RIGHT HAND   Final   Special Requests BOTTLES DRAWN AEROBIC AND ANAEROBIC 5CC   Final   Culture  Setup Time     Final   Value: 10/03/2013 21:16     Performed at [facility]   Culture     Final   Value:        BLOOD CULTURE RECEIVED NO GROWTH TO DATE CULTURE WILL BE HELD FOR 5 DAYS BEFORE ISSUING A FINAL NEGATIVE REPORT     Performed at [facility]   Report Status PENDING   Incomplete  CULTURE, EXPECTORATED SPUTUM-ASSESSMENT     Status: None   Collection Time    10/04/13  2:51 AM      Result Value Ref Range Status   Specimen Description SPUTUM   Final   Special Requests NONE   Final   Sputum evaluation     Final   Value: THIS SPECIMEN IS ACCEPTABLE. RESPIRATORY CULTURE REPORT TO FOLLOW.   Report Status 10/04/2013 FINAL   Final  CULTURE, RESPIRATORY (NON-EXPECTORATED)     Status: None   Collection Time    10/04/13  2:51 AM      Result Value Ref Range Status   Specimen Description SPUTUM   Final   Special Requests NONE   Final   Gram Stain     Final   Value: RARE WBC PRESENT, PREDOMINANTLY MONONUCLEAR     RARE SQUAMOUS EPITHELIAL CELLS PRESENT     RARE GRAM POSITIVE RODS     RARE GRAM POSITIVE COCCI IN PAIRS     Performed at [facility]   Culture     Final   Value: NORMAL OROPHARYNGEAL FLORA     Performed at [facility]   Report Status 10/06/2013 FINAL   Final     Studies: No results found.  Scheduled Meds: . acetaZOLAMIDE  250 mg Oral Daily  . amLODipine  10 mg Oral Daily  . arformoterol  15 mcg Nebulization BID  . aspirin EC  81 mg Oral Daily  . benzonatate  200 mg Oral TID  . budesonide (PULMICORT) nebulizer solution  0.5 mg Nebulization  BID  . cefTRIAXone (ROCEPHIN)  IV  1 g Intravenous Q24H  . enoxaparin (LOVENOX) injection  40 mg Subcutaneous Q24H  . furosemide  40 mg Intravenous BID  . ipratropium  0.5 mg Nebulization Q6H  . levalbuterol  1.25 mg Nebulization 4 times per day  . lisinopril  5 mg Oral Daily  . metoprolol tartrate  25 mg Oral BID  . nicotine  21 mg Transdermal Daily  . predniSONE  60 mg Oral Q breakfast  . simvastatin  20 mg Oral q1800   Continuous Infusions:    Marinda ElkAbraham Feliz Ortiz  [facility] Pager [phone]. If 8PM-8AM, please contact night-coverage at [email or web address], password [email or web address] 10/07/2013, 2:20 PM  LOS: 4 days

## 2013-10-07 NOTE — Progress Notes (Signed)
Utilization Review Completed Jerrald Doverspike J. Alsie Younes, RN, BSN, NCM [phone]  

## 2013-10-07 NOTE — Progress Notes (Signed)
CSW left message for Marlene Lard- Financial Counselor to call back re: referral to [facility] to initiate a disability application. Financial services is currently working with patient to clarify Medicaid coverage issues.  Lorri Frederick. [place]  [phone]

## 2013-10-08 LAB — BASIC METABOLIC PANEL
BUN: 27 mg/dL — ABNORMAL HIGH (ref 6–23)
CO2: 31 mEq/L (ref 19–32)
Calcium: 9.2 mg/dL (ref 8.4–10.5)
Chloride: 97 mEq/L (ref 96–112)
Creatinine, Ser: 1.11 mg/dL (ref 0.50–1.35)
GFR calc Af Amer: 82 mL/min — ABNORMAL LOW (ref >90)
GFR calc non Af Amer: 71 mL/min — ABNORMAL LOW (ref >90)
Glucose, Bld: 97 mg/dL (ref 70–99)
Potassium: 3.8 mEq/L (ref 3.7–5.3)
Sodium: 140 mEq/L (ref 137–147)

## 2013-10-08 MED ORDER — LISINOPRIL 20 MG PO TABS: 20.0000 mg | ORAL_TABLET | Freq: Every day | ORAL | Status: DC

## 2013-10-08 MED ORDER — METOPROLOL TARTRATE 25 MG PO TABS: 25.0000 mg | ORAL_TABLET | Freq: Two times a day (BID) | ORAL | Status: DC

## 2013-10-08 MED ORDER — ASPIRIN 81 MG PO TBEC: 81.0000 mg | DELAYED_RELEASE_TABLET | Freq: Every day | ORAL | Status: DC

## 2013-10-08 MED ORDER — PREDNISONE 10 MG PO TABS: ORAL_TABLET | ORAL | Status: DC

## 2013-10-08 MED ORDER — SPIRONOLACTONE 12.5 MG HALF TABLET: 12.5000 mg | ORAL_TABLET | Freq: Every day | ORAL | Status: DC

## 2013-10-08 MED ORDER — SODIUM CHLORIDE 0.9 % IV BOLUS (SEPSIS)
250.0000 mL | Freq: Once | INTRAVENOUS | Status: AC
  Administered 2013-10-08: 250 mL via INTRAVENOUS

## 2013-10-08 MED ORDER — SIMVASTATIN 20 MG PO TABS: 20.0000 mg | ORAL_TABLET | Freq: Every day | ORAL | Status: DC

## 2013-10-08 MED ORDER — BUDESONIDE-FORMOTEROL FUMARATE 160-4.5 MCG/ACT IN AERO: 2.0000 | INHALATION_SPRAY | Freq: Two times a day (BID) | RESPIRATORY_TRACT | Status: DC

## 2013-10-08 MED ORDER — TIOTROPIUM BROMIDE MONOHYDRATE 18 MCG IN CAPS: 18.0000 ug | ORAL_CAPSULE | Freq: Every day | RESPIRATORY_TRACT | Status: DC

## 2013-10-08 MED ORDER — FUROSEMIDE 40 MG PO TABS: 40.0000 mg | ORAL_TABLET | Freq: Every day | ORAL | Status: DC

## 2013-10-08 MED ORDER — LEVOFLOXACIN 750 MG PO TABS: 750.0000 mg | ORAL_TABLET | Freq: Every day | ORAL | Status: DC

## 2013-10-08 NOTE — Discharge Summary (Signed)
Physician Discharge Summary  Jared [name] ZOX:[id number] DOB: 21-Sep-1955 DOA: 10/03/2013  PCP: No PCP Per Patient  Admit date: 10/03/2013 Discharge date: 10/08/2013  Time spent: 35 minutes  Recommendations for Outpatient Follow-up:  1. Follow up with cards in 1 week, titrate meds as tolerate it. 2. PCP in 2 weeks. 3.  BNP    Component Value Date/Time   PROBNP 1242.0* 10/05/2013 1045   Filed Weights   10/06/13 0516 10/07/13 0456 10/08/13 0601  Weight: 90.13 kg (198 lb 11.2 oz) 88.905 kg (196 lb) 87.998 kg (194 lb)     Discharge Diagnoses:  Principal Problem:   Acute respiratory failure Active Problems:   CAP (community acquired pneumonia)   Hypertension   COPD with acute exacerbation   Acute diastolic congestive heart failure   Sinus tachycardia   Atrial tachycardia, paroxysmal   CAD (coronary artery disease), native coronary artery   Nonischemic dilated cardiomyopathy   Discharge Condition: stable  Diet recommendation: low sodium diet   History of present illness:  58 y.o. male, with no known medical problems except hypertension which he was told several years ago, has not seen a physician in many years and is on no home medications. [facility] with chief complaints of fever, productive cough and shortness of breath. These symptoms started about 4 days ago, at [facility] ER his workup was consistent with acute hypoxic and hypercapnic respiratory failure, community-acquired pneumonia, initially patient required BiPAP however within an hour he came off of BiPAP and was stable on nasal cannula oxygen. He also had mild left-sided chest pain initially upon his presentation which has now completely resolved. Hospitalist were consulted for inpatient workup for above dictated problems.    Hospital Course:  Acute hypoxic and hypercapnic respiratory failure due to community-acquired pneumonia, COPD exacerbation, acute on chronic systolic  heart failure.  - Required BiPAP initially. Started on IV antibiotics and steroids. - Change to levaquin and prednisone orally once breathing improved. - Strep neg & legionella antigen both neg  - change to symbicort and Spiriva, albuterol PRN  - cont as an outpatient  Acute on chronic systolic heart failure with acute exacerbation, EF 20-25%:  - Start on With IV lasix, cardiology consulted agree with treatment. - Wt on admit: 93.3kg >> 90->87.9 kg  - Continue ACEI/BB and titrate for BP.  - Change lasix to orals.  - Add low dose spironolactone.  - Continue ASA   Sinus tachycardia:  - likely related to SOB and nebulizer treatments and resolving   Abnl TSH: Likely suppressed from IV steroids. fT3/fT4 are stable  - Repeat TFTs in 4 weeks to ensure back to normal and not masking subclinical hyperthyroidism   Hyperkalemia:  - likely spurious and corrected spontaneously   HTN, blood pressures mildly elevated.  - d/c norvasc norvasc  - Increase BB and ACE-I with significant improvement in BP. - will need further titration by pcp.  Cigarette and nicotine abuse and dependence with withdrawal:  - Nicotine patch  - Counseled smoking cessation    Procedures:  CXR  BC x2  ECHO ef 20-25%  Consultations:  Cardiology  Discharge Exam: Filed Vitals:   10/08/13 0917  BP: 134/88  Pulse: 83  Temp:   Resp:     General: A&O x3 Cardiovascular: RRR Respiratory: good air movement CTA B/L  Discharge Instructions  Discharge Orders   Future Appointments Provider Department Dept Phone   10/12/2013 11:30 AM Rosalio Macadamia, NP [facility]  [place] Office [phone]   10/15/2013 10:00 AM Doris Cheadle, MD [facility] [phone]   Future Orders Complete By Expires   Diet - low sodium heart healthy  As directed    Increase activity slowly  As directed        Medication List         aspirin 81 MG EC tablet  Take 1 tablet (81 mg total) by mouth  daily.     budesonide-formoterol 160-4.5 MCG/ACT inhaler  Commonly known as:  SYMBICORT  Inhale 2 puffs into the lungs 2 (two) times daily.     furosemide 40 MG tablet  Commonly known as:  LASIX  Take 1 tablet (40 mg total) by mouth daily.     levofloxacin 750 MG tablet  Commonly known as:  LEVAQUIN  Take 1 tablet (750 mg total) by mouth daily.     lisinopril 20 MG tablet  Commonly known as:  PRINIVIL,ZESTRIL  Take 1 tablet (20 mg total) by mouth daily.     metoprolol tartrate 25 MG tablet  Commonly known as:  LOPRESSOR  Take 1 tablet (25 mg total) by mouth 2 (two) times daily.     predniSONE 10 MG tablet  Commonly known as:  DELTASONE  Takes 6 tablets for 1 days, then 5 tablets for 1 days, then 4 tablets for 1 days, then 3 tablets for 1 days, then 2 tabs for 1 days, then 1 tab for 1 days, and then stop.     simvastatin 20 MG tablet  Commonly known as:  ZOCOR  Take 1 tablet (20 mg total) by mouth daily at 6 PM.     spironolactone 12.5 mg Tabs tablet  Commonly known as:  ALDACTONE  Take 0.5 tablets (12.5 mg total) by mouth daily.     tiotropium 18 MCG inhalation capsule  Commonly known as:  SPIRIVA  Place 1 capsule (18 mcg total) into inhaler and inhale daily.       No Known Allergies     Follow-up Information   Follow up with Norma Fredrickson, NP On 10/12/2013. (11:30 AM)    Specialty:  Nurse Practitioner   Contact information:   [address] [place] [address] [phone]       Follow up with [facility]     On 10/15/2013. (@10 :00 am spoke with Toney Reil )    Contact information:   [address] [place] [address] [phone]      Follow up with Valera Castle, MD In 2 weeks. (hospital follow up)    Specialty:  Cardiology   Contact information:   [address] [place] [address] [phone]        The results of significant diagnostics from this hospitalization (including imaging,  microbiology, ancillary and laboratory) are listed below for reference.    Significant Diagnostic Studies: Dg Chest 2 View  10/03/2013   CLINICAL DATA:  Shortness of breath, chest pain and headache.  EXAM: CHEST  2 VIEW  COMPARISON:  None.  FINDINGS: The lungs are well-aerated. Patchy right-sided airspace opacity raises concern for pneumonia. There is no evidence of pleural effusion or pneumothorax.  The heart is normal in size; the mediastinal contour is within normal limits. No acute osseous abnormalities are seen.  IMPRESSION: Patchy right-sided pneumonia noted.   Electronically Signed   By: Roanna Raider M.D.   On: 10/03/2013 06:31   Dg Chest [facility]  10/04/2013   CLINICAL DATA:  Wheezing, shortness of breath  EXAM: PORTABLE CHEST - 1 VIEW  COMPARISON:  Prior chest x-ray 10/03/2013  FINDINGS: Apparent interval progression of cardiomegaly likely related to portable frontal technique. However, there has been interval development of diffuse mild interstitial prominence consistent with interstitial edema. No pleural effusion or pneumothorax. No acute osseous abnormality.  IMPRESSION: Interval development of diffuse bilateral interstitial prominence favored to reflect interstitial pulmonary edema and mild CHF.   Electronically Signed   By: Malachy MoanHeath  McCullough M.D.   On: 10/04/2013 08:55    Microbiology: Recent Results (from the past 240 hour(s))  CULTURE, BLOOD (ROUTINE X 2)     Status: None   Collection Time    10/03/13  6:05 AM      Result Value Ref Range Status   Specimen Description BLOOD RIGHT FOREARM   Final   Special Requests BOTTLES DRAWN AEROBIC AND ANAEROBIC [id number] EACH   Final   Culture  Setup Time     Final   Value: 10/03/2013 13:51     Performed at [facility]   Culture     Final   Value:        BLOOD CULTURE RECEIVED NO GROWTH TO DATE CULTURE WILL BE HELD FOR 5 DAYS BEFORE ISSUING A FINAL NEGATIVE REPORT     Performed at [facility]   Report Status PENDING    Incomplete  CULTURE, BLOOD (ROUTINE X 2)     Status: None   Collection Time    10/03/13  6:10 AM      Result Value Ref Range Status   Specimen Description BLOOD LEFT HAND   Final   Special Requests BOTTLES DRAWN AEROBIC AND ANAEROBIC 5CC EACH   Final   Culture  Setup Time     Final   Value: 10/03/2013 13:51     Performed at [facility]   Culture     Final   Value:        BLOOD CULTURE RECEIVED NO GROWTH TO DATE CULTURE WILL BE HELD FOR 5 DAYS BEFORE ISSUING A FINAL NEGATIVE REPORT     Performed at [facility]   Report Status PENDING   Incomplete  CULTURE, BLOOD (ROUTINE X 2)     Status: None   Collection Time    10/03/13 11:55 AM      Result Value Ref Range Status   Specimen Description BLOOD LEFT ARM   Final   Special Requests BOTTLES DRAWN AEROBIC AND ANAEROBIC 5CC   Final   Culture  Setup Time     Final   Value: 10/03/2013 20:29     Performed at [facility]   Culture     Final   Value:        BLOOD CULTURE RECEIVED NO GROWTH TO DATE CULTURE WILL BE HELD FOR 5 DAYS BEFORE ISSUING A FINAL NEGATIVE REPORT     Performed at [facility]   Report Status PENDING   Incomplete  CULTURE, BLOOD (ROUTINE X 2)     Status: None   Collection Time    10/03/13 12:00 PM      Result Value Ref Range Status   Specimen Description BLOOD RIGHT HAND   Final   Special Requests BOTTLES DRAWN AEROBIC AND ANAEROBIC 5CC   Final   Culture  Setup Time     Final   Value: 10/03/2013 21:16     Performed at [facility]   Culture  Final   Value:        BLOOD CULTURE RECEIVED NO GROWTH TO DATE CULTURE WILL BE HELD FOR 5 DAYS BEFORE ISSUING A FINAL NEGATIVE REPORT     Performed at [facility]   Report Status PENDING   Incomplete  CULTURE, EXPECTORATED SPUTUM-ASSESSMENT     Status: None   Collection Time    10/04/13  2:51 AM      Result Value Ref Range Status   Specimen Description SPUTUM   Final   Special Requests NONE   Final   Sputum evaluation      Final   Value: THIS SPECIMEN IS ACCEPTABLE. RESPIRATORY CULTURE REPORT TO FOLLOW.   Report Status 10/04/2013 FINAL   Final  CULTURE, RESPIRATORY (NON-EXPECTORATED)     Status: None   Collection Time    10/04/13  2:51 AM      Result Value Ref Range Status   Specimen Description SPUTUM   Final   Special Requests NONE   Final   Gram Stain     Final   Value: RARE WBC PRESENT, PREDOMINANTLY MONONUCLEAR     RARE SQUAMOUS EPITHELIAL CELLS PRESENT     RARE GRAM POSITIVE RODS     RARE GRAM POSITIVE COCCI IN PAIRS     Performed at [facility]   Culture     Final   Value: NORMAL OROPHARYNGEAL FLORA     Performed at [facility]   Report Status 10/06/2013 FINAL   Final     Labs: Basic Metabolic Panel:  Recent Labs Lab 10/04/13 0930 10/05/13 0415 10/06/13 0515 10/07/13 0730 10/08/13 0600  NA 142 140 147 145 140  K 5.1 4.3 4.6 4.0 3.8  CL 103 94* 94* 99 97  CO2 29 37* 43* 36* 31  GLUCOSE 147* 153* 139* 109* 97  BUN 18 18 23  26* 27*  CREATININE 0.91 0.94 1.03 0.95 1.11  CALCIUM 8.6 9.2 9.3 9.2 9.2   Liver Function Tests:  Recent Labs Lab 10/07/13 0730  AST 46*  ALT 49  ALKPHOS 85  BILITOT 0.3  PROT 7.1  ALBUMIN 3.3*   No results found for this basename: LIPASE, AMYLASE,  in the last 168 hours No results found for this basename: AMMONIA,  in the last 168 hours CBC:  Recent Labs Lab 10/03/13 0550 10/03/13 1200 10/04/13 0502 10/05/13 0415 10/06/13 0515 10/07/13 0730  WBC 6.6 6.8 8.2 10.4 13.0* 11.8*  NEUTROABS 4.0  --   --   --   --   --   HGB 15.5 14.9 15.0 14.5 15.7 16.4  HCT 48.5 46.8 48.5 48.3 49.7 52.4*  MCV 94.5 94.9 97.0 97.2 95.6 94.6  PLT 170 167 191 204 214 219   Cardiac Enzymes:  Recent Labs Lab 10/03/13 0550 10/03/13 1200 10/03/13 1815 10/03/13 2359  TROPONINI <0.30 <0.30 <0.30 <0.30   BNP: BNP (last 3 results)  Recent Labs  10/03/13 0550 10/05/13 1045  PROBNP 1690.0* 1242.0*   CBG: No results found for this  basename: GLUCAP,  in the last 168 hours     Signed:  Marinda Elk  [facility] 10/08/2013, 1:34 PM

## 2013-10-08 NOTE — Progress Notes (Signed)
Patient seen and examined and agree with note as outlined by Wilburt Finlay PA-C.  CHF much improved and now on PO Lasix.  Diamox d/c'd.  Renal function stable.  No further recs at this time.  Will sign off.

## 2013-10-08 NOTE — Progress Notes (Signed)
Patient being discharged home. Discharge instructions given to patient. Will transfer via wheelchair with family. Patient stable.

## 2013-10-08 NOTE — Progress Notes (Signed)
Subjective: Breathing better  Objective: Vital signs in last 24 hours: Temp:  [97.8 F (36.6 C)-98.5 F (36.9 C)] 98.4 F (36.9 C) (04/30 0601) Pulse Rate:  [69-77] 69 (04/30 0601) Resp:  [20] 20 (04/30 0601) BP: (118-142)/(68-87) 124/87 mmHg (04/30 0601) SpO2:  [94 %-96 %] 94 % (04/30 0601) Weight:  [194 lb (87.998 kg)] 194 lb (87.998 kg) (04/30 0601) Last BM Date: 10/08/13  Intake/Output from previous day: 04/29 0701 - 04/30 0700 In: 1080 [P.O.:1080] Out: 925 [Urine:925] Intake/Output this shift: Total I/O In: 120 [P.O.:120] Out: -   Medications Current Facility-Administered Medications  Medication Dose Route Frequency Provider Last Rate Last Dose  . acetaminophen (TYLENOL) tablet 650 mg  650 mg Oral Q6H PRN Renae Fickle, MD      . acetaZOLAMIDE (DIAMOX) tablet 250 mg  250 mg Oral Daily Quintella Reichert, MD   250 mg at 10/07/13 0946  . albuterol (PROVENTIL) (2.5 MG/3ML) 0.083% nebulizer solution 3 mL  3 mL Inhalation Q4H PRN Marinda Elk, MD      . aspirin EC tablet 81 mg  81 mg Oral Daily Leroy Sea, MD   81 mg at 10/07/13 0946  . benzonatate (TESSALON) capsule 200 mg  200 mg Oral TID Renae Fickle, MD   200 mg at 10/07/13 2131  . budesonide-formoterol (SYMBICORT) 160-4.5 MCG/ACT inhaler 2 puff  2 puff Inhalation BID Marinda Elk, MD   2 puff at 10/07/13 2109  . enoxaparin (LOVENOX) injection 40 mg  40 mg Subcutaneous Q24H Mickey Farber, RPH   40 mg at 10/07/13 1204  . furosemide (LASIX) tablet 40 mg  40 mg Oral Daily Marinda Elk, MD   40 mg at 10/07/13 1614  . guaiFENesin-dextromethorphan (ROBITUSSIN DM) 100-10 MG/5ML syrup 5 mL  5 mL Oral Q4H PRN Leroy Sea, MD   5 mL at 10/04/13 0517  . HYDROcodone-acetaminophen (NORCO/VICODIN) 5-325 MG per tablet 1-2 tablet  1-2 tablet Oral Q4H PRN Leroy Sea, MD      . levofloxacin [facility]) tablet 750 mg  750 mg Oral Daily Marinda Elk, MD   750 mg at 10/07/13 1614  .  lisinopril (PRINIVIL,ZESTRIL) tablet 20 mg  20 mg Oral Daily Marinda Elk, MD      . metoprolol tartrate (LOPRESSOR) tablet 25 mg  25 mg Oral BID Pricilla Riffle, MD   25 mg at 10/07/13 2131  . nicotine (NICODERM CQ - dosed in mg/24 hours) patch 21 mg  21 mg Transdermal Daily Renae Fickle, MD   21 mg at 10/07/13 0946  . ondansetron (ZOFRAN) tablet 4 mg  4 mg Oral Q6H PRN Leroy Sea, MD       Or  . ondansetron (ZOFRAN) injection 4 mg  4 mg Intravenous Q6H PRN Leroy Sea, MD      . predniSONE (DELTASONE) tablet 50 mg  50 mg Oral Q breakfast Marinda Elk, MD   50 mg at 10/08/13 0631  . simvastatin (ZOCOR) tablet 20 mg  20 mg Oral q1800 Quintella Reichert, MD   20 mg at 10/07/13 1806  . spironolactone (ALDACTONE) tablet 12.5 mg  12.5 mg Oral Daily Marinda Elk, MD   12.5 mg at 10/07/13 1614  . tiotropium (SPIRIVA) inhalation capsule 18 mcg  18 mcg Inhalation Daily Marinda Elk, MD        PE: General appearance: alert, cooperative and no distress Lungs: clear to auscultation bilaterally Heart: regular rate  and rhythm, S1, S2 normal, no murmur, click, rub or gallop Abdomen: +BS, Nontender Extremities: No LEE Pulses: 2+ and symmetric Skin: Warm and dry Neurologic: Grossly normal  Lab Results:   Recent Labs  10/06/13 0515 10/07/13 0730  WBC 13.0* 11.8*  HGB 15.7 16.4  HCT 49.7 52.4*  PLT 214 219   BMET  Recent Labs  10/06/13 0515 10/07/13 0730 10/08/13 0600  NA 147 145 140  K 4.6 4.0 3.8  CL 94* 99 97  CO2 43* 36* 31  GLUCOSE 139* 109* 97  BUN 23 26* 27*  CREATININE 1.03 0.95 1.11  CALCIUM 9.3 9.2 9.2    Assessment/Plan Patient is a 58 yo admitted with SOB, cough. Work up revealed severely depressed LVEF  Patient reports knowing that he had heart failure Cath in past. Cath records obtained showing 20% irregularities in the LAD/LCX and 40% prox/distal RCA with EF 35%. Not on meds at home and continues to smoke   Principal Problem:    Acute respiratory failure  Active Problems:   CAP (community acquired pneumonia)     Hypertension  BP well controlled.  Lopressor 25 bid, Lisinopril 20.     Acute systolic congestive heart failure He appears euvolemic.  Net fluids: +0.2L/-5.0L.  Now on PO lasix 40mg  Daily.  Aldactone 12.5mg .  BNP 1690>>>1242(last 4/27).  We discussed daily weight monitoring, low sodium diet and when to call the office.  Will arrange TOC follow up.  Monday, May 4, 11:30AM    COPD with acute exacerbation     Atrial tachycardia, paroxysmal  Maintaining SR.     CAD (coronary artery disease), native coronary artery   Nonischemic dilated cardiomyopathy, EF 20-25%    LOS: 5 days    Wilburt FinlayBryan Aeva Posey PA-C 10/08/2013 9:14 AM

## 2013-10-08 NOTE — Consult Note (Signed)
Heart Failure Navigator Consult Note  Presentation: Jared [name]  is a 58 y.o. male, with no known medical problems except hypertension which he was told several years ago, has not seen a physician in many years and is on no home medications. [facility] with chief complaints of fever, productive cough and shortness of breath. These symptoms started about 4 days ago, at [facility] his workup was consistent with acute hypoxic and hypercapnic respiratory failure, community-acquired pneumonia, initially patient required BiPAP however within an hour he came off of BiPAP and was stable on nasal cannula oxygen. He also had mild left-sided chest pain initially upon his presentation which has now completely resolved.   Past Medical History  Diagnosis Date  . Hypertension   . Anginal pain   . Stroke   . Shortness of breath   . Headache(784.0)     History   Social History  . Marital Status: Married    Spouse Name: N/A    Number of Children: N/A  . Years of Education: N/A   Social History Main Topics  . Smoking status: Current Every Day Smoker -- 2.00 packs/day    Types: Cigarettes  . Smokeless tobacco: None  . Alcohol Use: Yes     Comment: occasionally  . Drug Use: No  . Sexual Activity: Yes   Other Topics Concern  . None   Social History Narrative  . None    ECHO:Study Conclusions--10/04/13  - Left ventricle: The cavity size was normal. Wall thickness was increased in a pattern of mild LVH. Systolic function was severely reduced. The estimated ejection fraction was in the range of 20% to 25%. Diffuse hypokinesis. D-Shaped septum consistent with elevated right sided pressures. - Mitral valve: Calcified annulus. Mildly thickened leaflets . - Left atrium: The atrium was mildly dilated. - Right atrium: The atrium was mildly dilated. - Pulmonary arteries: PA peak pressure: 66mm Hg (S).   BNP    Component Value Date/Time   PROBNP  1242.0* 10/05/2013 1045    Education Assessment and Provision:  Detailed education and instructions provided on heart failure disease management including the following:  Signs and symptoms of Heart Failure When to call the physician Importance of daily weights Low sodium diet  Fluid restriction Medication management Anticipated future follow-up appointments  Patient education given on each of the above topics.  Patient acknowledges understanding and acceptance of all instructions.  Jared [name] had received previous education and I reinforced and reviewed all topics above.  He acknowledges understanding and is able to teach back.  He lives with his sister.  He says that he does have a scale and will have no problem weighing daily.  He does admit that he will have issues getting mediations at discharge.  I will discuss with care management to establish a plan to get his medications.  Education Materials:  "Living Better With Heart Failure" Booklet, Daily Weight Tracker Tool and Heart Failure Educational Video.   High Risk Criteria for Readmission and/or Poor Patient Outcomes:  (Recommend Follow-up with [facility])--Yes   EF <30%- Yes-20-25%  2 or more admissions in 6 months- No  Difficult social situation- Yes--limited financial resources   Demonstrates medication noncompliance- No   Barriers of Care:  Knowledge of medical conditions, ability/desire to be compliant.  Discharge Planning:   Plans to discharge to home with his sister.  He will need ongoing education and compliance reinforcement.

## 2013-10-09 LAB — CULTURE, BLOOD (ROUTINE X 2)
Culture: NO GROWTH
Culture: NO GROWTH
Culture: NO GROWTH
Culture: NO GROWTH

## 2013-10-11 NOTE — Clinical Social Work Psychosocial (Addendum)
    Clinical Social Work Department BRIEF PSYCHOSOCIAL ASSESSMENT 10/07/2013  Patient:  Jared [name], Jared [name]     Account Number:  [id number]     Admit date:  10/03/2013  Clinical Social Worker:  Tiburcio Pea  Date/Time:  10/08/2013 02:20 PM  Referred by:  Physician  Date Referred:  10/08/2013 Referred for  Other - See comment   Other Referral:   Medicare/Medicaid issues   Interview type:  Patient Other interview type:    PSYCHOSOCIAL DATA Living Status:  SIBLING Admitted from facility:   Level of care:   Primary support name:  Tinnie Gens  [phone] Primary support relationship to patient:  SIBLING Degree of support available:   Strong support per Patient    CURRENT CONCERNS Current Concerns  Other - See comment   Other Concerns:   Wants to get on Medicaid    SOCIAL WORK ASSESSMENT / PLAN 58 year old male admitted from home where he lives with his sister. Patient stated wanted to apply for Medicare and Medicaid; CSW checked and determined that patient already has medicaid but he states that is listed under "Family Planning" medicaid. CSW contacted Marlene Lard, financial counselor for follow up.  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  CSW discussed requirements for Medicare and as patient has not been deemed disabled for the 2 year requirement- he will not meet criteria for Medicare. Patient was appreciative of this information and was agreeable to work with financial counselor re: Medicaid. He plans to return home with his sister and denies any further SW needs. CSW will sign off.   Assessment/plan status:  No Further Intervention Required Other assessment/ plan:   Information/referral to community resources:   Referred to Dispensing optician for Medicaid issues and follow up.    PATIENT'S/FAMILY'S RESPONSE TO PLAN OF CARE: Patient is alert and oriented- very pleasant gentleman  who had concerns about insurance coverage. 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  Patient was able to verbalize understanding about Medicaid and Medicare and plans to work with the financial counselor as needed. He was appreciative of CSW's visit and the information supplied. CSW did not contact his sister as he stated that he handles his own business and medical issues. CSW signing off.

## 2013-10-12 ENCOUNTER — Ambulatory Visit (INDEPENDENT_AMBULATORY_CARE_PROVIDER_SITE_OTHER): Payer: Medicaid Other | Admitting: Nurse Practitioner

## 2013-10-12 ENCOUNTER — Encounter: Payer: Self-pay | Admitting: Nurse Practitioner

## 2013-10-12 VITALS — BP 110/64 | HR 80 | Ht 76.0 in | Wt 198.8 lb

## 2013-10-12 DIAGNOSIS — Z720 Tobacco use: Secondary | ICD-10-CM

## 2013-10-12 DIAGNOSIS — F17200 Nicotine dependence, unspecified, uncomplicated: Secondary | ICD-10-CM

## 2013-10-12 DIAGNOSIS — I428 Other cardiomyopathies: Secondary | ICD-10-CM

## 2013-10-12 DIAGNOSIS — I10 Essential (primary) hypertension: Secondary | ICD-10-CM | POA: Diagnosis not present

## 2013-10-12 NOTE — Patient Instructions (Signed)
Stay on your current medicines  I will see you in 6 weeks - hope to try and increase your medicines and check labs at that visit  Avoid salt  Call the [facility] office at [phone] if you have any questions, problems or concerns.

## 2013-10-12 NOTE — Progress Notes (Signed)
Jared [name] Date of Birth: 12-Aug-1955 Medical Record #[id number]  History of Present Illness: Jared [name] is seen back today for a post hospital visit. Seen for Jared [name] - new to cardiology. He has ongoing tobacco abuse, HTN, prior stroke and noncompliance.  Most recently presented to the hospital with shortness of breath - treated with BiPAP - given antibiotics and nebs. Apparently reported that he was hospitalized about 6 years ago in [place] and told he had decreased pumping function. Had cardiac cath - Cardiac cath (10/11/2010) Done for CP Known cardiomyopathy prior. LM normal , LAD 20% irregularities, LCX 20% proximal, RCA: Dominant; 40% proximal, 40% distal LVEF 35% with diffuse hypokinesis.  Plan was to get him back on his medicines. Follow as an outpatient.   Comes back today. Here alone. Still smoking - says he has cut back. Feels ok. No chest pain. Not short of breath. Not dizzy or lightheaded. Taking his medicines - had been out of for over 3 years due to no insurance and the cost. Restricts his salt. To be seen at the [facility] clinic later this month to get established.   Current Outpatient Prescriptions  Medication Sig Dispense Refill  . aspirin EC 81 MG EC tablet Take 1 tablet (81 mg total) by mouth daily.  30 tablet  0  . budesonide-formoterol (SYMBICORT) 160-4.5 MCG/ACT inhaler Inhale 2 puffs into the lungs 2 (two) times daily.  1 Inhaler  12  . furosemide (LASIX) 40 MG tablet Take 1 tablet (40 mg total) by mouth daily.  30 tablet  0  . levofloxacin (LEVAQUIN) 750 MG tablet Take 1 tablet (750 mg total) by mouth daily.  3 tablet  0  . lisinopril (PRINIVIL,ZESTRIL) 20 MG tablet Take 1 tablet (20 mg total) by mouth daily.  30 tablet  0  . metoprolol tartrate (LOPRESSOR) 25 MG tablet Take 1 tablet (25 mg total) by mouth 2 (two) times daily.  30 tablet  0  . predniSONE (DELTASONE) 10 MG tablet Takes 6 tablets for 1 days, then 5 tablets for 1 days, then 4 tablets for 1 days,  then 3 tablets for 1 days, then 2 tabs for 1 days, then 1 tab for 1 days, and then stop.  42 tablet  0  . simvastatin (ZOCOR) 20 MG tablet Take 1 tablet (20 mg total) by mouth daily at 6 PM.  30 tablet  0  . spironolactone (ALDACTONE) 12.5 mg TABS tablet Take 0.5 tablets (12.5 mg total) by mouth daily.  30 tablet  0  . tiotropium (SPIRIVA) 18 MCG inhalation capsule Place 1 capsule (18 mcg total) into inhaler and inhale daily.  30 capsule  12   No current facility-administered medications for this visit.    No Known Allergies  Past Medical History  Diagnosis Date  . Hypertension   . Anginal pain   . Stroke   . Shortness of breath   . WVPXTGGY(694.8)     Past Surgical History  Procedure Laterality Date  . Cardiac catheterization      History  Smoking status  . Current Every Day Smoker -- 2.00 packs/day  . Types: Cigarettes  Smokeless tobacco  . Not on file    History  Alcohol Use  . Yes    Comment: occasionally    History reviewed. No pertinent family history.  Review of Systems: The review of systems is per the [facility].  All other systems were reviewed and are negative.  Physical Exam: BP 110/64  Pulse 80  Ht 6\' 4"  (1.93 m)  Wt 198 lb 12.8 oz (90.175 kg)  BMI 24.21 kg/m2 Patient is alert and in no acute distress. Smells of tobacco. Skin is warm and dry. Color is normal.  HEENT is unremarkable. Normocephalic/atraumatic. PERRL. Sclera are nonicteric. Neck is supple. No masses. No JVD. Lungs are clear. Cardiac exam shows a regular rate and rhythm. Abdomen is soft. Extremities are without edema. Gait and ROM are intact. No gross neurologic deficits noted.  LABORATORY DATA:  Lab Results  Component Value Date   WBC 11.8* 10/07/2013   HGB 16.4 10/07/2013   HCT 52.4* 10/07/2013   PLT 219 10/07/2013   GLUCOSE 97 10/08/2013   CHOL 170 10/04/2013   TRIG 65 10/04/2013   HDL 51 10/04/2013   LDLCALC 106* 10/04/2013   ALT 49 10/07/2013   AST 46* 10/07/2013   NA 140 10/08/2013   K  3.8 10/08/2013   CL 97 10/08/2013   CREATININE 1.11 10/08/2013   BUN 27* 10/08/2013   CO2 31 10/08/2013   TSH 0.324* 10/05/2013   HGBA1C 6.2* 10/03/2013   Echo Study Conclusions  - Left ventricle: The cavity size was normal. Wall thickness was increased in a pattern of mild LVH. Systolic function was severely reduced. The estimated ejection fraction was in the range of 20% to 25%. Diffuse hypokinesis. D-Shaped septum consistent with elevated right sided pressures. - Mitral valve: Calcified annulus. Mildly thickened leaflets . - Left atrium: The atrium was mildly dilated. - Right atrium: The atrium was mildly dilated. - Pulmonary arteries: PA peak pressure: 43mm Hg (S).    Assessment / Plan: 1. 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 I have left him on his current regimen. See again in 6 weeks - try to titrate meds. Plan repeat echo in 3 months after being back on therapy for possible ICD but he needs to demonstrate compliance. He is buying cigarettes - I have told him he can buy his medicines.   2. Mild nonobstructive CAD - no chest pain.   3. HTN - back on his medicines - BP ok.   4. Tobacco abuse - not ready to stop  5. Recent CAP - just finished his antibiotics, will finish his steroid taper later this week.  Patient is agreeable to this plan and will call if any problems develop in the interim.   Rosalio MacadamiaLori C. Dadrian Ballantine, RN, ANP-C [facility] [address] [place], [place]  [address] [phone]

## 2013-10-15 ENCOUNTER — Ambulatory Visit: Payer: Medicaid Other | Attending: Internal Medicine | Admitting: Internal Medicine

## 2013-10-15 ENCOUNTER — Encounter: Payer: Self-pay | Admitting: Internal Medicine

## 2013-10-15 VITALS — BP 130/80 | HR 74 | Temp 99.1°F | Resp 16 | Wt 202.4 lb

## 2013-10-15 DIAGNOSIS — Z8701 Personal history of pneumonia (recurrent): Secondary | ICD-10-CM | POA: Insufficient documentation

## 2013-10-15 DIAGNOSIS — J189 Pneumonia, unspecified organism: Secondary | ICD-10-CM

## 2013-10-15 DIAGNOSIS — Z7982 Long term (current) use of aspirin: Secondary | ICD-10-CM | POA: Insufficient documentation

## 2013-10-15 DIAGNOSIS — I428 Other cardiomyopathies: Secondary | ICD-10-CM

## 2013-10-15 DIAGNOSIS — R946 Abnormal results of thyroid function studies: Secondary | ICD-10-CM

## 2013-10-15 DIAGNOSIS — Z1211 Encounter for screening for malignant neoplasm of colon: Secondary | ICD-10-CM

## 2013-10-15 DIAGNOSIS — I2510 Atherosclerotic heart disease of native coronary artery without angina pectoris: Secondary | ICD-10-CM | POA: Insufficient documentation

## 2013-10-15 DIAGNOSIS — I10 Essential (primary) hypertension: Secondary | ICD-10-CM | POA: Insufficient documentation

## 2013-10-15 DIAGNOSIS — R7989 Other specified abnormal findings of blood chemistry: Secondary | ICD-10-CM

## 2013-10-15 DIAGNOSIS — Z79899 Other long term (current) drug therapy: Secondary | ICD-10-CM | POA: Insufficient documentation

## 2013-10-15 DIAGNOSIS — F17200 Nicotine dependence, unspecified, uncomplicated: Secondary | ICD-10-CM | POA: Insufficient documentation

## 2013-10-15 DIAGNOSIS — J3489 Other specified disorders of nose and nasal sinuses: Secondary | ICD-10-CM

## 2013-10-15 DIAGNOSIS — J449 Chronic obstructive pulmonary disease, unspecified: Secondary | ICD-10-CM

## 2013-10-15 DIAGNOSIS — I420 Dilated cardiomyopathy: Secondary | ICD-10-CM

## 2013-10-15 DIAGNOSIS — J4489 Other specified chronic obstructive pulmonary disease: Secondary | ICD-10-CM | POA: Insufficient documentation

## 2013-10-15 DIAGNOSIS — R0981 Nasal congestion: Secondary | ICD-10-CM

## 2013-10-15 DIAGNOSIS — Z8673 Personal history of transient ischemic attack (TIA), and cerebral infarction without residual deficits: Secondary | ICD-10-CM | POA: Insufficient documentation

## 2013-10-15 DIAGNOSIS — I5023 Acute on chronic systolic (congestive) heart failure: Secondary | ICD-10-CM | POA: Insufficient documentation

## 2013-10-15 DIAGNOSIS — E785 Hyperlipidemia, unspecified: Secondary | ICD-10-CM | POA: Insufficient documentation

## 2013-10-15 MED ORDER — NICOTINE 21 MG/24HR TD PT24: 21.0000 mg | MEDICATED_PATCH | Freq: Every day | TRANSDERMAL | Status: DC

## 2013-10-15 MED ORDER — FLUTICASONE PROPIONATE 50 MCG/ACT NA SUSP: 2.0000 | Freq: Every day | NASAL | Status: DC

## 2013-10-15 NOTE — Progress Notes (Signed)
Patient here for hospital follow up Was admitted for CHF, pneumonia, COPD

## 2013-10-15 NOTE — Progress Notes (Signed)
Patient Demographics  Jared DageJohnny Delio, is a 58 y.o. male  ZOX:[id number]  [id number]  DOB - 07/13/1955  CC:  Chief Complaint  Patient presents with  . Hospitalization Follow-up       HPI: Jared [name] is a 58 y.o. male here today to establish medical care. Who has history of hypertension for several years, recently hospitalized with symptoms of fever productive cough shortness of breath, EMR reviewed her patient was diagnosed with pneumonia, COPD exacerbation was an acute hypoxic hypercapnic respiratory failure, was treated with IV antibiotics steroids and BiPAP patient was switched to oral antibiotic as well as was started on Symbicort Spiriva and albuterol when necessary, patient also had her acute on chronic systolic heart failure was treated with IV Lasix cardiology was on board, patient was continued with beta blocker ACE inhibitor, also patient was advised to quit smoking, patient already followed up with the cardiologist as outpatient. Patient is to smoke cigarettes, I have advised patient to quit smoking he is going to try nicotine patch, he denies any acute symptoms denies any chest pain shortness of breath any orthopnea or PND. Reported to have lot of and her mental allergies  nasal congestion. Patient has No headache, No chest pain, No abdominal pain - No Nausea, No new weakness tingling or numbness, No Cough - SOB.  No Known Allergies Past Medical History  Diagnosis Date  . Hypertension   . Stroke   . Shortness of breath   . Headache(784.0)   . Systolic heart failure   . CAD (coronary artery disease)   . Tobacco abuse    Current Outpatient Prescriptions on File Prior to Visit  Medication Sig Dispense Refill  . aspirin EC 81 MG EC tablet Take 1 tablet (81 mg total) by mouth daily.  30 tablet  0  . budesonide-formoterol (SYMBICORT) 160-4.5 MCG/ACT inhaler Inhale 2 puffs into the lungs 2 (two) times daily.  1 Inhaler  12  . furosemide (LASIX) 40 MG tablet Take 1  tablet (40 mg total) by mouth daily.  30 tablet  0  . lisinopril (PRINIVIL,ZESTRIL) 20 MG tablet Take 1 tablet (20 mg total) by mouth daily.  30 tablet  0  . metoprolol tartrate (LOPRESSOR) 25 MG tablet Take 1 tablet (25 mg total) by mouth 2 (two) times daily.  30 tablet  0  . predniSONE (DELTASONE) 10 MG tablet Takes 6 tablets for 1 days, then 5 tablets for 1 days, then 4 tablets for 1 days, then 3 tablets for 1 days, then 2 tabs for 1 days, then 1 tab for 1 days, and then stop.  42 tablet  0  . simvastatin (ZOCOR) 20 MG tablet Take 1 tablet (20 mg total) by mouth daily at 6 PM.  30 tablet  0  . spironolactone (ALDACTONE) 12.5 mg TABS tablet Take 0.5 tablets (12.5 mg total) by mouth daily.  30 tablet  0  . tiotropium (SPIRIVA) 18 MCG inhalation capsule Place 1 capsule (18 mcg total) into inhaler and inhale daily.  30 capsule  12   No current facility-administered medications on file prior to visit.   Family History  Problem Relation Age of Onset  . Cancer Mother   . Heart disease Mother   . Diabetes Mother   . Cancer Father   . Diabetes Father   . Diabetes Sister   . Diabetes Brother    History   Social History  . Marital Status: Married    Spouse Name: N/A  Number of Children: N/A  . Years of Education: N/A   Occupational History  . Not on file.   Social History Main Topics  . Smoking status: Current Every Day Smoker -- 2.00 packs/day for 40 years    Types: Cigarettes  . Smokeless tobacco: Not on file  . Alcohol Use: Yes     Comment: occasionally  . Drug Use: No     Comment: last use was 3 years ago   . Sexual Activity: Yes   Other Topics Concern  . Not on file   Social History Narrative  . No narrative on file    Review of Systems: Constitutional: Negative for fever, chills, diaphoresis, activity change, appetite change and fatigue. HENT: Negative for ear pain, nosebleeds, congestion, facial swelling, rhinorrhea, neck pain, neck stiffness and ear discharge.    Eyes: Negative for pain, discharge, redness, itching and visual disturbance. Respiratory: Negative for cough, choking, chest tightness, shortness of breath, wheezing and stridor.  Cardiovascular: Negative for chest pain, palpitations and leg swelling. Gastrointestinal: Negative for abdominal distention. Genitourinary: Negative for dysuria, urgency, frequency, hematuria, flank pain, decreased urine volume, difficulty urinating and dyspareunia.  Musculoskeletal: Negative for back pain, joint swelling, arthralgia and gait problem. Neurological: Negative for dizziness, tremors, seizures, syncope, facial asymmetry, speech difficulty, weakness, light-headedness, numbness and headaches.  Hematological: Negative for adenopathy. Does not bruise/bleed easily. Psychiatric/Behavioral: Negative for hallucinations, behavioral problems, confusion, dysphoric mood, decreased concentration and agitation.    Objective:   Filed Vitals:   10/15/13 1022  BP: 130/80  Pulse:   Temp:   Resp:     Physical Exam: Constitutional: Patient appears well-developed and well-nourished. No distress. HENT: Normocephalic, atraumatic, External right and left ear normal. Oropharynx is clear and moist. Congestion no sinus tenderness. Eyes: Conjunctivae and EOM are normal. PERRLA, no scleral icterus. Neck: Normal ROM. Neck supple. No JVD. No tracheal deviation. No thyromegaly. CVS: RRR, S1/S2 +, no murmurs, no gallops, no carotid bruit.  Pulmonary: Effort and breath sounds normal, no stridor, rhonchi, wheezes, rales.  Abdominal: Soft. BS +, no distension, tenderness, rebound or guarding.  Musculoskeletal: Normal range of motion. No edema and no tenderness.  Neuro: Alert. Normal reflexes, muscle tone coordination. No cranial nerve deficit. Skin: Skin is warm and dry. No rash noted. Not diaphoretic. No erythema. No pallor. Psychiatric: Normal mood and affect. Behavior, judgment, thought content normal.  Lab Results   Component Value Date   WBC 11.8* 10/07/2013   HGB 16.4 10/07/2013   HCT 52.4* 10/07/2013   MCV 94.6 10/07/2013   PLT 219 10/07/2013   Lab Results  Component Value Date   CREATININE 1.11 10/08/2013   BUN 27* 10/08/2013   NA 140 10/08/2013   K 3.8 10/08/2013   CL 97 10/08/2013   CO2 31 10/08/2013    Lab Results  Component Value Date   HGBA1C 6.2* 10/03/2013   Lipid Panel     Component Value Date/Time   CHOL 170 10/04/2013 0502   TRIG 65 10/04/2013 0502   HDL 51 10/04/2013 0502   CHOLHDL 3.3 10/04/2013 0502   VLDL 13 10/04/2013 0502   LDLCALC 106* 10/04/2013 0502       Assessment and plan:   1. Hypertension Repeat manual blood pressure is 130/80, respiration continue with her Lasix lisinopril metoprolol spironolactone.  - CBC with Differential; Future - COMPLETE METABOLIC PANEL WITH GFR; Future - Vit D  25 hydroxy (rtn osteoporosis monitoring); Future  2. Nonischemic dilated cardiomyopathy/4. CAD (coronary artery disease), native coronary artery  Symptoms stable following up with the cardiologist.  3. CAP (community acquired pneumonia) Treated with antibiotic patient finished a course.    5. COPD (chronic obstructive pulmonary disease)/ On Symbicort and dyspareunia patient reports improvement advised patient to quit smoking.  6. Abnormal TSH Will repeat his TSH level prior to the next visit. - TSH; Future  7. Nasal congestion Trial of - fluticasone (FLONASE) 50 MCG/ACT nasal spray; Place 2 sprays into both nostrils daily.  Dispense: 16 g; Refill: 6  8. Smoking  - nicotine (NICODERM CQ) 21 mg/24hr patch; Place 1 patch (21 mg total) onto the skin daily.  Dispense: 28 patch; Refill: 0  9. Special screening for malignant neoplasms, colon  - Ambulatory referral to Gastroenterology  10. Other and unspecified hyperlipidemia - Repeat fasting Lipid panel; Future   Health Maintenance -Colonoscopy: referred to GI   Return in about 3 months (around 01/15/2014) for  hypertension, COPD, CAD, hyperipidemia.  Doris Cheadle, MD

## 2013-11-13 ENCOUNTER — Other Ambulatory Visit: Payer: Self-pay | Admitting: *Deleted

## 2013-11-13 MED ORDER — FUROSEMIDE 40 MG PO TABS: 40.0000 mg | ORAL_TABLET | Freq: Every day | ORAL | Status: DC

## 2013-11-13 MED ORDER — SPIRONOLACTONE 12.5 MG HALF TABLET: 12.5000 mg | ORAL_TABLET | Freq: Every day | ORAL | Status: DC

## 2013-11-13 MED ORDER — LISINOPRIL 20 MG PO TABS: 20.0000 mg | ORAL_TABLET | Freq: Every day | ORAL | Status: DC

## 2013-11-13 MED ORDER — SIMVASTATIN 20 MG PO TABS: 20.0000 mg | ORAL_TABLET | Freq: Every day | ORAL | Status: DC

## 2013-11-13 MED ORDER — METOPROLOL TARTRATE 25 MG PO TABS: 25.0000 mg | ORAL_TABLET | Freq: Two times a day (BID) | ORAL | Status: DC

## 2013-11-17 ENCOUNTER — Encounter: Payer: Self-pay | Admitting: Internal Medicine

## 2013-11-18 ENCOUNTER — Encounter: Payer: Self-pay | Admitting: Nurse Practitioner

## 2013-11-18 ENCOUNTER — Ambulatory Visit (INDEPENDENT_AMBULATORY_CARE_PROVIDER_SITE_OTHER): Payer: Self-pay | Admitting: Nurse Practitioner

## 2013-11-18 ENCOUNTER — Other Ambulatory Visit: Payer: Self-pay | Admitting: *Deleted

## 2013-11-18 ENCOUNTER — Telehealth: Payer: Self-pay | Admitting: *Deleted

## 2013-11-18 VITALS — BP 160/100 | HR 68 | Ht 76.0 in | Wt 214.8 lb

## 2013-11-18 DIAGNOSIS — I428 Other cardiomyopathies: Secondary | ICD-10-CM

## 2013-11-18 DIAGNOSIS — R0609 Other forms of dyspnea: Secondary | ICD-10-CM

## 2013-11-18 DIAGNOSIS — R0989 Other specified symptoms and signs involving the circulatory and respiratory systems: Secondary | ICD-10-CM

## 2013-11-18 DIAGNOSIS — Z720 Tobacco use: Secondary | ICD-10-CM

## 2013-11-18 DIAGNOSIS — I10 Essential (primary) hypertension: Secondary | ICD-10-CM

## 2013-11-18 DIAGNOSIS — R0600 Dyspnea, unspecified: Secondary | ICD-10-CM

## 2013-11-18 DIAGNOSIS — F17200 Nicotine dependence, unspecified, uncomplicated: Secondary | ICD-10-CM

## 2013-11-18 LAB — BASIC METABOLIC PANEL
BUN: 19 mg/dL (ref 6–23)
CO2: 34 mEq/L — ABNORMAL HIGH (ref 19–32)
Calcium: 9.2 mg/dL (ref 8.4–10.5)
Chloride: 106 mEq/L (ref 96–112)
Creatinine, Ser: 1.2 mg/dL (ref 0.4–1.5)
GFR: 80.96 mL/min (ref >60.00)
Glucose, Bld: 94 mg/dL (ref 70–99)
Potassium: 4.1 mEq/L (ref 3.5–5.1)
Sodium: 144 mEq/L (ref 135–145)

## 2013-11-18 LAB — BRAIN NATRIURETIC PEPTIDE: Pro B Natriuretic peptide (BNP): 330 pg/mL — ABNORMAL HIGH (ref 0.0–100.0)

## 2013-11-18 MED ORDER — LISINOPRIL 20 MG PO TABS: 20.0000 mg | ORAL_TABLET | Freq: Every day | ORAL | Status: DC

## 2013-11-18 MED ORDER — FUROSEMIDE 40 MG PO TABS: 40.0000 mg | ORAL_TABLET | Freq: Every day | ORAL | Status: DC

## 2013-11-18 MED ORDER — SPIRONOLACTONE 25 MG PO TABS: 25.0000 mg | ORAL_TABLET | Freq: Every day | ORAL | Status: DC

## 2013-11-18 MED ORDER — METOPROLOL TARTRATE 25 MG PO TABS: 25.0000 mg | ORAL_TABLET | Freq: Two times a day (BID) | ORAL | Status: DC

## 2013-11-18 MED ORDER — SIMVASTATIN 20 MG PO TABS
20.0000 mg | ORAL_TABLET | Freq: Every day | ORAL | Status: DC
Start: 2013-11-18 — End: 2014-03-19

## 2013-11-18 NOTE — Patient Instructions (Addendum)
Continue with your current medicines - I have refiilled the Lasix, the metoprolol, the simvastatin, and Lisinopril - I am restarting the aldactone at 25 mg each day - these are at the drug store  We will call the drug store today to find out about your other medicines  We will check labs today  See me in 2 weeks  Don't smoke  Call the [facility] office at [phone] if you have any questions, problems or concerns.

## 2013-11-18 NOTE — Telephone Encounter (Signed)
S/w sylvia at [facility] @ [phone] pt couldn't understand why pharmacy wouldn't give pt inhalers, I looked on chart and stated pt still had refills.  Pharmacy stated pt no longer has insurance.

## 2013-11-18 NOTE — Progress Notes (Addendum)
Jared [name] Date of Birth: [birth year] Medical Record #[id number]  History of Present Illness: Jared [name] is seen back today for a 6 week visit. Seen for Dr. Tenny Craw - new to cardiology. Jared [name] has ongoing tobacco abuse, HTN, prior stroke and noncompliance.   Most recently presented to the hospital with shortness of breath - treated with BiPAP - given antibiotics and nebs. Apparently reported that Jared [name] was hospitalized about 6 years ago in [place] and told Jared [name] had decreased pumping function. Had cardiac cath - Cardiac cath (10/11/2010) Done for CP Known cardiomyopathy prior. LM normal , LAD 20% irregularities, LCX 20% proximal, RCA: Dominant; 40% proximal, 40% distal LVEF 35% with diffuse hypokinesis.   Plan was to get him back on his medicines. Follow as an outpatient. Repeat echo in 3 months after getting back on medicines.  Seen 6 weeks ago - was doing ok - cost has been an issue in the past but Jared [name] is able to buy cigarettes.  Comes back today. Here alone. Does not feel as well - Jared [name] is short of breath with activities. Not dizzy or lightheaded.  Says Jared [name] is not getting too much salt. Has been seen at the [facility] clinic. Jared [name] is off most of his medicines except for the lasix, ACE, metoprolol and zocor - says Jared [name] can't get the others from the drug store despite having lots of refills. Still smoking - not ready to stop. No swelling. Weight is up considerably.   Current Outpatient Prescriptions  Medication Sig Dispense Refill  . furosemide (LASIX) 40 MG tablet Take 1 tablet (40 mg total) by mouth daily.  30 tablet  0  . lisinopril (PRINIVIL,ZESTRIL) 20 MG tablet Take 1 tablet (20 mg total) by mouth daily.  30 tablet  0  . metoprolol tartrate (LOPRESSOR) 25 MG tablet Take 1 tablet (25 mg total) by mouth 2 (two) times daily.  60 tablet  0  . simvastatin (ZOCOR) 20 MG tablet Take 1 tablet (20 mg total) by mouth daily at 6 PM.  30 tablet  0  . aspirin EC 81 MG EC tablet Take 1 tablet (81 mg total) by  mouth daily.  30 tablet  0  . budesonide-formoterol (SYMBICORT) 160-4.5 MCG/ACT inhaler Inhale 2 puffs into the lungs 2 (two) times daily.  1 Inhaler  12  . fluticasone (FLONASE) 50 MCG/ACT nasal spray Place 2 sprays into both nostrils daily.  16 g  6  . nicotine (NICODERM CQ) 21 mg/24hr patch Place 1 patch (21 mg total) onto the skin daily.  28 patch  0  . spironolactone (ALDACTONE) 12.5 mg TABS tablet Take 0.5 tablets (12.5 mg total) by mouth daily.  15 tablet  0  . tiotropium (SPIRIVA) 18 MCG inhalation capsule Place 1 capsule (18 mcg total) into inhaler and inhale daily.  30 capsule  12   No current facility-administered medications for this visit.    No Known Allergies  Past Medical History  Diagnosis Date  . Hypertension   . Stroke   . Shortness of breath   . Headache(784.0)   . Systolic heart failure   . CAD (coronary artery disease)   . Tobacco abuse     Past Surgical History  Procedure Laterality Date  . Cardiac catheterization  10/2010    LM normal, LAD with 20% irregularities, LCX with 20%, RCA with 40% prox and 40% distal - EF of 35%    History  Smoking status  . Current Every Day Smoker -- 2.00 packs/day for  40 years  . Types: Cigarettes  Smokeless tobacco  . Not on file    History  Alcohol Use  . Yes    Comment: occasionally    Family History  Problem Relation Age of Onset  . Cancer Mother   . Heart disease Mother   . Diabetes Mother   . Cancer Father   . Diabetes Father   . Diabetes Sister   . Diabetes Brother     Review of Systems: The review of systems is per the HPI.  All other systems were reviewed and are negative.  Physical Exam: BP 160/100  Pulse 68  Ht 6\' 4"  (1.93 m)  Wt 214 lb 12.8 oz (97.433 kg)  BMI 26.16 kg/m2  SpO2 92% Patient is pleasant and in no acute distress. Smells of tobacco. Weight is up. Skin is warm and dry. Color is normal.  HEENT is unremarkable. Normocephalic/atraumatic. PERRL. Sclera are nonicteric. Neck is  supple. No masses. No JVD. Lungs are coarse. Cardiac exam shows a regular rate and rhythm. Abdomen is soft. Extremities are without edema. Gait and ROM are intact. No gross neurologic deficits noted.  Wt Readings from Last 3 Encounters:  11/18/13 214 lb 12.8 oz (97.433 kg)  10/15/13 202 lb 6.4 oz (91.808 kg)  10/12/13 198 lb 12.8 oz (90.175 kg)     LABORATORY DATA:  Pending   Lab Results  Component Value Date   WBC 11.8* 10/07/2013   HGB 16.4 10/07/2013   HCT 52.4* 10/07/2013   PLT 219 10/07/2013   GLUCOSE 97 10/08/2013   CHOL 170 10/04/2013   TRIG 65 10/04/2013   HDL 51 10/04/2013   LDLCALC 106* 10/04/2013   ALT 49 10/07/2013   AST 46* 10/07/2013   NA 140 10/08/2013   K 3.8 10/08/2013   CL 97 10/08/2013   CREATININE 1.11 10/08/2013   BUN 27* 10/08/2013   CO2 31 10/08/2013   TSH 0.324* 10/05/2013   HGBA1C 6.2* 10/03/2013   Echo Study Conclusions  - Left ventricle: The cavity size was normal. Wall thickness was increased in a pattern of mild LVH. Systolic function was severely reduced. The estimated ejection fraction was in the range of 20% to 25%. Diffuse hypokinesis. D-Shaped septum consistent with elevated right sided pressures. - Mitral valve: Calcified annulus. Mildly thickened leaflets . - Left atrium: The atrium was mildly dilated. - Right atrium: The atrium was mildly dilated. - Pulmonary arteries: PA peak pressure: 43mm Hg (S).  Assessment / Plan:  1. 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 Check labs today. See back in 2 weeks with lab.  Plan repeat echo in 3 months (after July 26) after being back on therapy for possible ICD but Jared [name] needs to demonstrate compliance. Jared [name] is buying cigarettes - I have told him Jared [name] can buy his medicines.   2. Mild nonobstructive CAD - no chest pain.   3. HTN - BP up - adding back Aldactone  4. Tobacco abuse - not ready  to stop  5. Shortness of breath - not on his inhalers despite having refills - still smoking - will try to talk with the drug store to figure this out.  I think his overall prognosis is tenuous - I do not think Jared [name] will be compliant.    Patient is agreeable  to this plan and will call if any problems develop in the interim.   Ayisha Pol C. Brylei Pedley, RN, ANP-C [facility] [address] [place], [place]  [address] [phone]   Addendum: have talked with patient's pharmacy - patient does have refills but had no insurance to buy them. As I suspected - compliance will be an issue.

## 2013-12-04 ENCOUNTER — Encounter: Payer: Self-pay | Admitting: Nurse Practitioner

## 2013-12-04 ENCOUNTER — Encounter (INDEPENDENT_AMBULATORY_CARE_PROVIDER_SITE_OTHER): Payer: Self-pay

## 2013-12-04 ENCOUNTER — Ambulatory Visit (INDEPENDENT_AMBULATORY_CARE_PROVIDER_SITE_OTHER): Payer: Self-pay | Admitting: Nurse Practitioner

## 2013-12-04 VITALS — BP 140/70 | HR 66 | Ht 75.0 in | Wt 204.1 lb

## 2013-12-04 DIAGNOSIS — I10 Essential (primary) hypertension: Secondary | ICD-10-CM

## 2013-12-04 DIAGNOSIS — F17200 Nicotine dependence, unspecified, uncomplicated: Secondary | ICD-10-CM

## 2013-12-04 DIAGNOSIS — I428 Other cardiomyopathies: Secondary | ICD-10-CM

## 2013-12-04 DIAGNOSIS — Z720 Tobacco use: Secondary | ICD-10-CM

## 2013-12-04 NOTE — Progress Notes (Signed)
Jared [name] Date of Birth: 02-17-1956 Medical Record #[id number]  History of Present Illness: Jared [name] is seen back today for a 2 week visit. Seen for Dr. Tenny Craw - new to cardiology. He has ongoing tobacco abuse, HTN, prior stroke and noncompliance.   Most recently presented to the hospital with shortness of breath - treated with BiPAP - given antibiotics and nebs. Apparently reported that he was hospitalized about 6 years ago in [place] and told he had decreased pumping function. Had cardiac cath - Cardiac cath (10/11/2010) Done for CP Known cardiomyopathy prior. LM normal , LAD 20% irregularities, LCX 20% proximal, RCA: Dominant; 40% proximal, 40% distal LVEF 35% with diffuse hypokinesis.   Plan was to get him back on his medicines. Follow as an outpatient. Repeat echo in 3 months after getting back on medicines.   I have seen him back several times since his discharge. Cost has been an issue but he continues to buy cigarettes. At his last visit, weight was up. Still smoking. Short of breath. Off most of his medicines except for lasix, ACE, metoprolol and statin.    Comes back today. Here alone. Now trying to apply for his [employer]. Out in the heat yesterday working and almost passed out - hard to say if he was hydrated enough but was out in the heat of the day. Says he cannot even afford aspirin. Did not go to the drug store after his last visit - did not start aldactone. Only taking lasix, zocor, metoprolol and his ACE. Not taking any other medicines. Still smoking - buying a pack a day at $5 per day. Remains short of breath. Occasional palpitations.   Current Outpatient Prescriptions  Medication Sig Dispense Refill  . furosemide (LASIX) 40 MG tablet Take 1 tablet (40 mg total) by mouth daily.  30 tablet  3  . lisinopril (PRINIVIL,ZESTRIL) 20 MG tablet Take 1 tablet (20 mg total) by mouth daily.  30 tablet  3  . metoprolol tartrate (LOPRESSOR) 25 MG tablet Take 1 tablet (25 mg total) by  mouth 2 (two) times daily.  60 tablet  3  . simvastatin (ZOCOR) 20 MG tablet Take 1 tablet (20 mg total) by mouth daily at 6 PM.  30 tablet  3  . aspirin EC 81 MG EC tablet Take 1 tablet (81 mg total) by mouth daily.  30 tablet  0  . budesonide-formoterol (SYMBICORT) 160-4.5 MCG/ACT inhaler Inhale 2 puffs into the lungs 2 (two) times daily.  1 Inhaler  12  . fluticasone (FLONASE) 50 MCG/ACT nasal spray Place 2 sprays into both nostrils daily.  16 g  6  . nicotine (NICODERM CQ) 21 mg/24hr patch Place 1 patch (21 mg total) onto the skin daily.  28 patch  0  . spironolactone (ALDACTONE) 25 MG tablet Take 1 tablet (25 mg total) by mouth daily.  30 tablet  3  . tiotropium (SPIRIVA) 18 MCG inhalation capsule Place 1 capsule (18 mcg total) into inhaler and inhale daily.  30 capsule  12   No current facility-administered medications for this visit.    No Known Allergies  Past Medical History  Diagnosis Date  . Hypertension   . Stroke   . Shortness of breath   . Headache(784.0)   . Systolic heart failure   . CAD (coronary artery disease)   . Tobacco abuse     Past Surgical History  Procedure Laterality Date  . Cardiac catheterization  10/2010    LM normal, LAD  with 20% irregularities, LCX with 20%, RCA with 40% prox and 40% distal - EF of 35%    History  Smoking status  . Current Every Day Smoker -- 2.00 packs/day for 40 years  . Types: Cigarettes  Smokeless tobacco  . Not on file    History  Alcohol Use  . Yes    Comment: occasionally    Family History  Problem Relation Age of Onset  . Cancer Mother   . Heart disease Mother   . Diabetes Mother   . Cancer Father   . Diabetes Father   . Diabetes Sister   . Diabetes Brother     Review of Systems: The review of systems is per the HPI.  All other systems were reviewed and are negative.  Physical Exam: BP 140/70  Pulse 66  Ht 6\' 3"  (1.905 m)  Wt 204 lb 1.9 oz (92.588 kg)  BMI 25.51 kg/m2  SpO2 92% Patient is alert  and in no acute distress. Smells of tobacco. Weight back down. Skin is warm and dry. Color is normal.  HEENT is unremarkable. Normocephalic/atraumatic. PERRL. Sclera are nonicteric. Neck is supple. No masses. No JVD. Lungs are coarse. Cardiac exam shows a regular rate and rhythm. Abdomen is soft. Extremities are without edema. Gait and ROM are intact. No gross neurologic deficits noted.  Wt Readings from Last 3 Encounters:  12/04/13 204 lb 1.9 oz (92.588 kg)  11/18/13 214 lb 12.8 oz (97.433 kg)  10/15/13 202 lb 6.4 oz (91.808 kg)    LABORATORY DATA:  Lab Results  Component Value Date   WBC 11.8* 10/07/2013   HGB 16.4 10/07/2013   HCT 52.4* 10/07/2013   PLT 219 10/07/2013   GLUCOSE 94 11/18/2013   CHOL 170 10/04/2013   TRIG 65 10/04/2013   HDL 51 10/04/2013   LDLCALC 106* 10/04/2013   ALT 49 10/07/2013   AST 46* 10/07/2013   NA 144 11/18/2013   K 4.1 11/18/2013   CL 106 11/18/2013   CREATININE 1.2 11/18/2013   BUN 19 11/18/2013   CO2 34* 11/18/2013   TSH 0.324* 10/05/2013   HGBA1C 6.2* 10/03/2013    BNP (last 3 results)  Recent Labs  10/03/13 0550 10/05/13 1045 11/18/13 0829  PROBNP 1690.0* 1242.0* 330.0*   Echo Study Conclusions  - Left ventricle: The cavity size was normal. Wall thickness was increased in a pattern of mild LVH. Systolic function was severely reduced. The estimated ejection fraction was in the range of 20% to 25%. Diffuse hypokinesis. D-Shaped septum consistent with elevated right sided pressures. - Mitral valve: Calcified annulus. Mildly thickened leaflets . - Left atrium: The atrium was mildly dilated. - Right atrium: The atrium was mildly dilated. - Pulmonary arteries: PA peak pressure: 53mm Hg (S).  Assessment / Plan:   1. NICM - EF of 20 to 25% - Plan was to repeat echo in 3 months (after July 26) after being back on therapy for possible ICD but he needs to demonstrate compliance -  He is not able to demonstrate this to me at this time. I do not think I  have much to offer. I have asked him to let us know if he gets his [employer] and would like for him to go to the drugstore. He is buying cigarettes - I have told him he can buy his medicines.   2. Mild nonobstructive CAD - no chest pain. We have given samples of aspirin today.   3. HTN - not able  to take additional medicines due to cost.   4. Tobacco abuse - not ready to stop   5. Shortness of breath - not on his inhalers despite having refills - not able to afford but still smoking.  Will get him back to see Dr. Tenny Crawoss. Will defer repeat echo to her. Once again, I have tried to stress the importance of being compliant.   Patient is agreeable to this plan and will call if any problems develop in the interim.   Rosalio MacadamiaLori C. Gerhardt, RN, ANP-C [facility] [address] [place], [place]  [id number] [phone]

## 2013-12-04 NOTE — Patient Instructions (Signed)
We will give you samples of aspirin today  If you get your [facility] card - please get back on all of your medicines and let us know that you are taking  See Dr. Tenny Craw in August for discussion of possible repeat echo.  Restrict salt as much as you can  Call the [facility] office at [phone] if you have any questions, problems or concerns.

## 2013-12-09 ENCOUNTER — Ambulatory Visit: Payer: No Typology Code available for payment source | Attending: Internal Medicine

## 2014-01-13 ENCOUNTER — Encounter: Payer: Self-pay | Admitting: Internal Medicine

## 2014-01-15 ENCOUNTER — Encounter: Payer: Self-pay | Admitting: Internal Medicine

## 2014-01-15 ENCOUNTER — Ambulatory Visit: Payer: Self-pay | Attending: Internal Medicine | Admitting: Internal Medicine

## 2014-01-15 VITALS — BP 131/79 | HR 72 | Temp 98.4°F | Resp 16 | Wt 203.8 lb

## 2014-01-15 DIAGNOSIS — J4489 Other specified chronic obstructive pulmonary disease: Secondary | ICD-10-CM | POA: Insufficient documentation

## 2014-01-15 DIAGNOSIS — F17200 Nicotine dependence, unspecified, uncomplicated: Secondary | ICD-10-CM | POA: Insufficient documentation

## 2014-01-15 DIAGNOSIS — I10 Essential (primary) hypertension: Secondary | ICD-10-CM | POA: Insufficient documentation

## 2014-01-15 DIAGNOSIS — I420 Dilated cardiomyopathy: Secondary | ICD-10-CM

## 2014-01-15 DIAGNOSIS — I428 Other cardiomyopathies: Secondary | ICD-10-CM | POA: Insufficient documentation

## 2014-01-15 DIAGNOSIS — J449 Chronic obstructive pulmonary disease, unspecified: Secondary | ICD-10-CM

## 2014-01-15 DIAGNOSIS — IMO0001 Reserved for inherently not codable concepts without codable children: Secondary | ICD-10-CM

## 2014-01-15 DIAGNOSIS — E785 Hyperlipidemia, unspecified: Secondary | ICD-10-CM | POA: Insufficient documentation

## 2014-01-15 MED ORDER — BUDESONIDE-FORMOTEROL FUMARATE 160-4.5 MCG/ACT IN AERO: 2.0000 | INHALATION_SPRAY | Freq: Two times a day (BID) | RESPIRATORY_TRACT | Status: DC

## 2014-01-15 MED ORDER — TIOTROPIUM BROMIDE MONOHYDRATE 18 MCG IN CAPS: 18.0000 ug | ORAL_CAPSULE | Freq: Every day | RESPIRATORY_TRACT | Status: DC

## 2014-01-15 NOTE — Progress Notes (Signed)
MRN: [id number] Name: Jared [name]  Sex: male Age: 58 y.o. DOB: 03/13/1956  Allergies: Review of patient's allergies indicates no known allergies.  Chief Complaint  Patient presents with  . Follow-up    HPI: Patient is 58 y.o. male who has history of hypertension nonischemic dilated cardiomyopathy CAD COPD comes today for followup, denies any acute symptoms, he had a blood work done in June which was reviewed with the patient currently patient also following up with the cardiologist denies any orthopnea PND or leg swelling, patient has history of COPD was prescribed Symbicort and spell as per patient he cannot afford and has not taken has occasional cough which is minimally productive denies any fever chills chest and shortness of breath.   Past Medical History  Diagnosis Date  . Hypertension   . Stroke   . Shortness of breath   . Headache(784.0)   . Systolic heart failure   . CAD (coronary artery disease)   . Tobacco abuse     Past Surgical History  Procedure Laterality Date  . Cardiac catheterization  10/2010    LM normal, LAD with 20% irregularities, LCX with 20%, RCA with 40% prox and 40% distal - EF of 35%      Medication List       This list is accurate as of: 01/15/14  9:31 AM.  Always use your most recent med list.               aspirin 81 MG EC tablet  Take 1 tablet (81 mg total) by mouth daily.     budesonide-formoterol 160-4.5 MCG/ACT inhaler  Commonly known as:  SYMBICORT  Inhale 2 puffs into the lungs 2 (two) times daily.     fluticasone 50 MCG/ACT nasal spray  Commonly known as:  FLONASE  Place 2 sprays into both nostrils daily.     furosemide 40 MG tablet  Commonly known as:  LASIX  Take 1 tablet (40 mg total) by mouth daily.     lisinopril 20 MG tablet  Commonly known as:  PRINIVIL,ZESTRIL  Take 1 tablet (20 mg total) by mouth daily.     metoprolol tartrate 25 MG tablet  Commonly known as:  LOPRESSOR  Take 1 tablet (25 mg total) by mouth  2 (two) times daily.     nicotine 21 mg/24hr patch  Commonly known as:  NICODERM CQ  Place 1 patch (21 mg total) onto the skin daily.     simvastatin 20 MG tablet  Commonly known as:  ZOCOR  Take 1 tablet (20 mg total) by mouth daily at 6 PM.     spironolactone 25 MG tablet  Commonly known as:  ALDACTONE  Take 1 tablet (25 mg total) by mouth daily.     tiotropium 18 MCG inhalation capsule  Commonly known as:  SPIRIVA  Place 1 capsule (18 mcg total) into inhaler and inhale daily.        Meds ordered this encounter  Medications  . budesonide-formoterol (SYMBICORT) 160-4.5 MCG/ACT inhaler    Sig: Inhale 2 puffs into the lungs 2 (two) times daily.    Dispense:  1 Inhaler    Refill:  12  . tiotropium (SPIRIVA) 18 MCG inhalation capsule    Sig: Place 1 capsule (18 mcg total) into inhaler and inhale daily.    Dispense:  30 capsule    Refill:  12     There is no immunization history on file for this patient.  Family History  Problem Relation Age of Onset  . Cancer Mother   . Heart disease Mother   . Diabetes Mother   . Cancer Father   . Diabetes Father   . Diabetes Sister   . Diabetes Brother     History  Substance Use Topics  . Smoking status: Current Every Day Smoker -- 2.00 packs/day for 40 years    Types: Cigarettes  . Smokeless tobacco: Not on file  . Alcohol Use: Yes     Comment: occasionally    Review of Systems   As noted in HPI  Filed Vitals:   01/15/14 0908  BP: 131/79  Pulse: 72  Temp: 98.4 F (36.9 C)  Resp: 16    Physical Exam  Physical Exam  Constitutional: No distress.  Eyes: EOM are normal. Pupils are equal, round, and reactive to light.  Cardiovascular: Normal rate and regular rhythm.   Pulmonary/Chest: Breath sounds normal. No respiratory distress. He has no wheezes. He has no rales.  Musculoskeletal: He exhibits no edema.    CBC    Component Value Date/Time   WBC 11.8* 10/07/2013 0730   RBC 5.54 10/07/2013 0730   HGB 16.4  10/07/2013 0730   HCT 52.4* 10/07/2013 0730   PLT 219 10/07/2013 0730   MCV 94.6 10/07/2013 0730   LYMPHSABS 1.8 10/03/2013 0550   MONOABS 0.8 10/03/2013 0550   EOSABS 0.0 10/03/2013 0550   BASOSABS 0.0 10/03/2013 0550    CMP     Component Value Date/Time   NA 144 11/18/2013 0829   K 4.1 11/18/2013 0829   CL 106 11/18/2013 0829   CO2 34* 11/18/2013 0829   GLUCOSE 94 11/18/2013 0829   BUN 19 11/18/2013 0829   CREATININE 1.2 11/18/2013 0829   CALCIUM 9.2 11/18/2013 0829   PROT 7.1 10/07/2013 0730   ALBUMIN 3.3* 10/07/2013 0730   AST 46* 10/07/2013 0730   ALT 49 10/07/2013 0730   ALKPHOS 85 10/07/2013 0730   BILITOT 0.3 10/07/2013 0730   GFRNONAA 71* 10/08/2013 0600   GFRAA 82* 10/08/2013 0600    Lab Results  Component Value Date/Time   CHOL 170 10/04/2013  5:02 AM    No components found with this basename: hga1c    Lab Results  Component Value Date/Time   AST 46* 10/07/2013  7:30 AM    Assessment and Plan  Essential hypertension - Plan: Blood pressure is well-controlled continued current meds, will repeat fasting blood chemistry COMPLETE METABOLIC PANEL WITH GFR  Other and unspecified hyperlipidemia - Plan currently patient is on simvastatin, will repeat fasting Lipid panel  Smoking Advise patient to quit smoking  Nonischemic dilated cardiomyopathy Patient denies any symptoms also following up with her cardiologist  COPD bronchitis - Plan: budesonide-formoterol (SYMBICORT) 160-4.5 MCG/ACT inhaler, tiotropium (SPIRIVA) 18 MCG inhalation capsule   Return in about 3 months (around 04/17/2014) for hypertension, COPD.  Doris Cheadle, MD

## 2014-01-15 NOTE — Progress Notes (Signed)
Patient here for follow up on his hypertension 

## 2014-01-19 ENCOUNTER — Encounter: Payer: Self-pay | Admitting: Internal Medicine

## 2014-01-20 ENCOUNTER — Other Ambulatory Visit: Payer: Self-pay

## 2014-01-29 ENCOUNTER — Ambulatory Visit: Payer: Self-pay | Admitting: Internal Medicine

## 2014-02-19 ENCOUNTER — Ambulatory Visit: Payer: Self-pay | Admitting: Internal Medicine

## 2014-02-22 ENCOUNTER — Other Ambulatory Visit: Payer: No Typology Code available for payment source

## 2014-02-25 NOTE — Progress Notes (Signed)
HPI Patient is a 58 yo with history of HTN, tob abuse, CVA, mild CAD and NICM.  Cath in 2012:  LAD 20%; LCx 20%  RCA 40% prox, 40% distal LVEF 35%. The patient was seen earlier this summer   Placed back on meds that he had been on previously.  Plan was for repeat echo this fall. On talking to the patient he says that he still gets SOB with activity  Denies SOB  No PND.  Works in Aeronautical [employer].  Does not dizziness during the summer but no syncope.    Current Outpatient Prescriptions   Medication  Sig       No Known Allergies  Current Outpatient Prescriptions  Medication Sig Dispense Refill  . aspirin EC 81 MG EC tablet Take 1 tablet (81 mg total) by mouth daily.  30 tablet  0  . budesonide-formoterol (SYMBICORT) 160-4.5 MCG/ACT inhaler Inhale 2 puffs into the lungs 2 (two) times daily.  1 Inhaler  12  . fluticasone (FLONASE) 50 MCG/ACT nasal spray Place 2 sprays into both nostrils daily.  16 g  6  . furosemide (LASIX) 40 MG tablet Take 1 tablet (40 mg total) by mouth daily.  30 tablet  3  . lisinopril (PRINIVIL,ZESTRIL) 20 MG tablet Take 1 tablet (20 mg total) by mouth daily.  30 tablet  3  . metoprolol tartrate (LOPRESSOR) 25 MG tablet Take 1 tablet (25 mg total) by mouth 2 (two) times daily.  60 tablet  3  . simvastatin (ZOCOR) 20 MG tablet Take 1 tablet (20 mg total) by mouth daily at 6 PM.  30 tablet  3  . spironolactone (ALDACTONE) 25 MG tablet Take 1 tablet (25 mg total) by mouth daily.  30 tablet  3  . tiotropium (SPIRIVA) 18 MCG inhalation capsule Place 1 capsule (18 mcg total) into inhaler and inhale daily.  30 capsule  12   No current facility-administered medications for this visit.    Past Medical History  Diagnosis Date  . Hypertension   . Stroke   . Shortness of breath   . Headache(784.0)   . Systolic heart failure   . CAD (coronary artery disease)   . Tobacco abuse     Past Surgical History  Procedure Laterality Date  . Cardiac catheterization  10/2010    LM normal,  LAD with 20% irregularities, LCX with 20%, RCA with 40% prox and 40% distal - EF of 35%    Family History  Problem Relation Age of Onset  . Cancer Mother   . Heart disease Mother   . Diabetes Mother   . Cancer Father   . Diabetes Father   . Diabetes Sister   . Diabetes Brother     History   Social History  . Marital Status: Married    Spouse Name: N/A    Number of Children: N/A  . Years of Education: N/A   Occupational History  . Not on file.   Social History Main Topics  . Smoking status: Current Every Day Smoker -- 2.00 packs/day for 40 years    Types: Cigarettes  . Smokeless tobacco: Not on file  . Alcohol Use: Yes     Comment: occasionally  . Drug Use: No     Comment: last use was 3 years ago   . Sexual Activity: Yes   Other Topics Concern  . Not on file   Social History Narrative  . No narrative on file    Review of Systems:  All  systems reviewed.  They are negative to the above problem except as previously stated.  Vital Signs: BP 144/80  Pulse 67  Ht  (1.905 m)  Wt 206 lb 12.8 oz (93.804 kg)  BMI 25.85 kg/m2  Physical Exam Patient is in NAD HEENT:  Normocephalic, atraumatic. EOMI, PERRLA.  Neck: JVP is normal.  No bruits.  Lungs: clear to auscultation. No rales no wheezes.  Heart: Regular rate and rhythm. Normal S1, S2. No S3.   No significant murmurs. PMI not displaced.  Abdomen:  Supple, nontender. Normal bowel sounds. No masses. No hepatomegaly.  Extremities:   Good distal pulses throughout. No lower extremity edema.  Musculoskeletal :moving all extremities.  Neuro:   alert and oriented x3.  CN II-XII grossly intact.   Assessment and Plan:  1.  NICM  VOlume status looks good.  WIll check labs  Repeat echo to reassess EF on Meds.    2.  HTN  BP is not under control  On my check BP was 144/98.  Will wait on labs (renal function )  May need to add another agent  I am not sure going up on ACE inhibitor will help much  3.  CAD  No  symptoms of angina

## 2014-02-26 ENCOUNTER — Encounter: Payer: Self-pay | Admitting: Internal Medicine

## 2014-02-26 ENCOUNTER — Ambulatory Visit (INDEPENDENT_AMBULATORY_CARE_PROVIDER_SITE_OTHER): Payer: No Typology Code available for payment source | Admitting: Internal Medicine

## 2014-02-26 VITALS — BP 144/80 | HR 67 | Ht 75.0 in | Wt 206.8 lb

## 2014-02-26 DIAGNOSIS — I471 Supraventricular tachycardia: Secondary | ICD-10-CM

## 2014-02-26 DIAGNOSIS — I10 Essential (primary) hypertension: Secondary | ICD-10-CM

## 2014-02-26 DIAGNOSIS — I5031 Acute diastolic (congestive) heart failure: Secondary | ICD-10-CM

## 2014-02-26 DIAGNOSIS — I509 Heart failure, unspecified: Secondary | ICD-10-CM

## 2014-02-26 LAB — BASIC METABOLIC PANEL
BUN: 15 mg/dL (ref 6–23)
CO2: 28 mEq/L (ref 19–32)
Calcium: 9.5 mg/dL (ref 8.4–10.5)
Chloride: 102 mEq/L (ref 96–112)
Creat: 0.98 mg/dL (ref 0.50–1.35)
Glucose, Bld: 75 mg/dL (ref 70–99)
Potassium: 3.7 mEq/L (ref 3.5–5.3)
Sodium: 140 mEq/L (ref 135–145)

## 2014-02-26 LAB — BRAIN NATRIURETIC PEPTIDE: Brain Natriuretic Peptide: 376.1 pg/mL — ABNORMAL HIGH (ref 0.0–100.0)

## 2014-02-26 NOTE — Patient Instructions (Signed)
Your physician recommends that you continue on your current medications as directed. Please refer to the Current Medication list given to you today. Your physician recommends that you return for lab work in: today.  (BNP, BMET)  Your physician has requested that you have an echocardiogram. Echocardiography is a painless test that uses sound waves to create images of your heart. It provides your doctor with information about the size and shape of your heart and how well your heart's chambers and valves are working. This procedure takes approximately one hour. There are no restrictions for this procedure.

## 2014-03-01 ENCOUNTER — Telehealth: Payer: Self-pay

## 2014-03-01 NOTE — Telephone Encounter (Signed)
Patient informed of labs. Per Dr. Tenny Craw, he needs to schedule a 1 month BP check. He said he would like to do that tomorrow as he is coming here for echo.

## 2014-03-02 ENCOUNTER — Ambulatory Visit (HOSPITAL_COMMUNITY): Payer: No Typology Code available for payment source | Attending: Internal Medicine

## 2014-03-02 DIAGNOSIS — Z8673 Personal history of transient ischemic attack (TIA), and cerebral infarction without residual deficits: Secondary | ICD-10-CM | POA: Insufficient documentation

## 2014-03-02 DIAGNOSIS — I509 Heart failure, unspecified: Secondary | ICD-10-CM | POA: Insufficient documentation

## 2014-03-02 DIAGNOSIS — I10 Essential (primary) hypertension: Secondary | ICD-10-CM | POA: Insufficient documentation

## 2014-03-02 DIAGNOSIS — R0989 Other specified symptoms and signs involving the circulatory and respiratory systems: Secondary | ICD-10-CM | POA: Insufficient documentation

## 2014-03-02 DIAGNOSIS — I5031 Acute diastolic (congestive) heart failure: Secondary | ICD-10-CM

## 2014-03-02 DIAGNOSIS — R51 Headache: Secondary | ICD-10-CM | POA: Insufficient documentation

## 2014-03-02 DIAGNOSIS — I2510 Atherosclerotic heart disease of native coronary artery without angina pectoris: Secondary | ICD-10-CM | POA: Insufficient documentation

## 2014-03-02 DIAGNOSIS — I471 Supraventricular tachycardia: Secondary | ICD-10-CM

## 2014-03-02 DIAGNOSIS — R0609 Other forms of dyspnea: Secondary | ICD-10-CM | POA: Insufficient documentation

## 2014-03-02 DIAGNOSIS — F17200 Nicotine dependence, unspecified, uncomplicated: Secondary | ICD-10-CM | POA: Insufficient documentation

## 2014-03-02 NOTE — Progress Notes (Signed)
2D Echo completed. 03/02/2014 

## 2014-03-05 ENCOUNTER — Telehealth: Payer: Self-pay | Admitting: *Deleted

## 2014-03-05 DIAGNOSIS — I10 Essential (primary) hypertension: Secondary | ICD-10-CM

## 2014-03-05 MED ORDER — LISINOPRIL 40 MG PO TABS: 40.0000 mg | ORAL_TABLET | Freq: Every day | ORAL | Status: DC

## 2014-03-05 NOTE — Telephone Encounter (Signed)
Pt informed. Will increase lisinopril to 40 mg daily. Will return for labs 03/15/14. F/u with Dr. Tenny Craw scheduled 04/02/14.

## 2014-03-05 NOTE — Telephone Encounter (Signed)
Message copied by Lendon Ka on Fri Mar 05, 2014 10:39 AM ------      Message from: Dietrich Pates V      Created: Wed Mar 03, 2014  2:58 PM       Echo shows pumpoing function is improved  Only minimally decreased.      Keep on current medicines; thy have helped.      Increase lisinopril to 40  Keep on other meds      Check BMET in 10 days.      Make sure has f/u ------

## 2014-03-10 ENCOUNTER — Other Ambulatory Visit: Payer: Self-pay

## 2014-03-15 ENCOUNTER — Other Ambulatory Visit (INDEPENDENT_AMBULATORY_CARE_PROVIDER_SITE_OTHER): Payer: No Typology Code available for payment source | Admitting: *Deleted

## 2014-03-15 ENCOUNTER — Other Ambulatory Visit: Payer: No Typology Code available for payment source

## 2014-03-15 DIAGNOSIS — I10 Essential (primary) hypertension: Secondary | ICD-10-CM

## 2014-03-15 LAB — BASIC METABOLIC PANEL
BUN: 19 mg/dL (ref 6–23)
CO2: 27 mEq/L (ref 19–32)
Calcium: 9 mg/dL (ref 8.4–10.5)
Chloride: 101 mEq/L (ref 96–112)
Creatinine, Ser: 1 mg/dL (ref 0.4–1.5)
GFR: 101.39 mL/min (ref >60.00)
Glucose, Bld: 90 mg/dL (ref 70–99)
Potassium: 3.5 mEq/L (ref 3.5–5.1)
Sodium: 139 mEq/L (ref 135–145)

## 2014-03-19 ENCOUNTER — Other Ambulatory Visit: Payer: Self-pay

## 2014-03-19 MED ORDER — SIMVASTATIN 20 MG PO TABS: 20.0000 mg | ORAL_TABLET | Freq: Every day | ORAL | Status: DC

## 2014-03-19 MED ORDER — SPIRONOLACTONE 25 MG PO TABS: 25.0000 mg | ORAL_TABLET | Freq: Every day | ORAL | Status: DC

## 2014-03-19 MED ORDER — FUROSEMIDE 40 MG PO TABS: 40.0000 mg | ORAL_TABLET | Freq: Every day | ORAL | Status: DC

## 2014-03-19 MED ORDER — LISINOPRIL 40 MG PO TABS: 40.0000 mg | ORAL_TABLET | Freq: Every day | ORAL | Status: DC

## 2014-03-19 MED ORDER — METOPROLOL TARTRATE 25 MG PO TABS: 25.0000 mg | ORAL_TABLET | Freq: Two times a day (BID) | ORAL | Status: DC

## 2014-04-02 ENCOUNTER — Ambulatory Visit: Payer: No Typology Code available for payment source | Admitting: Internal Medicine

## 2014-04-30 ENCOUNTER — Ambulatory Visit (INDEPENDENT_AMBULATORY_CARE_PROVIDER_SITE_OTHER): Payer: No Typology Code available for payment source | Admitting: Internal Medicine

## 2014-04-30 VITALS — BP 144/80 | HR 84 | Resp 18 | Wt 208.8 lb

## 2014-04-30 DIAGNOSIS — I10 Essential (primary) hypertension: Secondary | ICD-10-CM

## 2014-04-30 DIAGNOSIS — I5022 Chronic systolic (congestive) heart failure: Secondary | ICD-10-CM

## 2014-04-30 NOTE — Progress Notes (Signed)
HPI Patient is a 58 yo with history of HTN, tob abuse, CVA, mild CAD and NICM.  Cath in 2012:  LAD 20%; LCx 20%  RCA 40% prox, 40% distal LVEF 35%. The patient was seen earlier this summer   Placed back on meds that he had been on previously.  I saw him earlier this fall  BP up  I increased Lisinopril  Echo done which showed improvement in LVEF to 45 to 50%   Since seen breathing has been good  No CP  No PND  Current Outpatient Prescriptions   Medication  Sig       No Known Allergies  Current Outpatient Prescriptions  Medication Sig Dispense Refill  . aspirin EC 81 MG EC tablet Take 1 tablet (81 mg total) by mouth daily. 30 tablet 0  . furosemide (LASIX) 40 MG tablet Take 1 tablet (40 mg total) by mouth daily. 90 tablet 3  . lisinopril (PRINIVIL,ZESTRIL) 40 MG tablet Take 1 tablet (40 mg total) by mouth daily. 90 tablet 3  . metoprolol tartrate (LOPRESSOR) 25 MG tablet Take 1 tablet (25 mg total) by mouth 2 (two) times daily. 180 tablet 3  . simvastatin (ZOCOR) 20 MG tablet Take 1 tablet (20 mg total) by mouth daily at 6 PM. 90 tablet 3  . spironolactone (ALDACTONE) 25 MG tablet Take 1 tablet (25 mg total) by mouth daily. 90 tablet 3  . budesonide-formoterol (SYMBICORT) 160-4.5 MCG/ACT inhaler Inhale 2 puffs into the lungs 2 (two) times daily. 1 Inhaler 12  . fluticasone (FLONASE) 50 MCG/ACT nasal spray Place 2 sprays into both nostrils daily. 16 g 6  . tiotropium (SPIRIVA) 18 MCG inhalation capsule Place 1 capsule (18 mcg total) into inhaler and inhale daily. 30 capsule 12   No current facility-administered medications for this visit.    Past Medical History  Diagnosis Date  . Hypertension   . Stroke   . Shortness of breath   . Headache(784.0)   . Systolic heart failure   . CAD (coronary artery disease)   . Tobacco abuse     Past Surgical History  Procedure Laterality Date  . Cardiac catheterization  10/2010    LM normal, LAD with 20% irregularities, LCX with 20%, RCA with 40%  prox and 40% distal - EF of 35%    Family History  Problem Relation Age of Onset  . Cancer Mother   . Heart disease Mother   . Diabetes Mother   . Cancer Father   . Diabetes Father   . Diabetes Sister   . Diabetes Brother     History   Social History  . Marital Status: Married    Spouse Name: N/A    Number of Children: N/A  . Years of Education: N/A   Occupational History  . Not on file.   Social History Main Topics  . Smoking status: Current Every Day Smoker -- 2.00 packs/day for 40 years    Types: Cigarettes  . Smokeless tobacco: Not on file  . Alcohol Use: Yes     Comment: occasionally  . Drug Use: No     Comment: last use was 3 years ago   . Sexual Activity: Yes   Other Topics Concern  . Not on file   Social History Narrative    Review of Systems:  All systems reviewed.  They are negative to the above problem except as previously stated.  Vital Signs: BP 144/80 mmHg  Pulse 84  Resp 18  Wt  208 lb 12.8 oz (94.711 kg)  SpO2 91%  Physical Exam Patient is in NAD HEENT:  Normocephalic, atraumatic. EOMI, PERRLA.  Neck: JVP is normal.  No bruits.  Lungs: clear to auscultation. No rales no wheezes.  Heart: Regular rate and rhythm. Normal S1, S2. No S3.   No significant murmurs. PMI not displaced.  Abdomen:  Supple, nontender. Normal bowel sounds. No masses. No hepatomegaly.  Extremities:   Good distal pulses throughout. No lower extremity edema.  Musculoskeletal :moving all extremities.  Neuro:   alert and oriented x3.  CN II-XII grossly intact.   Assessment and Plan:  1.  NICM  VOlume status looks good.  LVEF improved on echo.    2.  HTN BP is better on current regimen  He does note a cough  I have given him samples of Benicar   Follow up for cough.  Patient to call  3.  CAD  No symptoms of angina  4.  Tob  Counselled on quitting .   Dietrich Pates

## 2014-04-30 NOTE — Patient Instructions (Signed)
Your physician has recommended you make the following change in your medication:  Take the Benicar in place of lisinopril x 2 weeks to see if cough improves.  Please call the office to let Dr. Tenny Craw know how your cough is. Your physician recommends that you schedule a follow-up appointment in: March 2016 with Dr. Tenny Craw.

## 2014-05-07 ENCOUNTER — Encounter: Payer: Self-pay | Admitting: Internal Medicine

## 2014-06-01 ENCOUNTER — Telehealth: Payer: Self-pay | Admitting: Internal Medicine

## 2014-06-01 MED ORDER — FUROSEMIDE 40 MG PO TABS: 40.0000 mg | ORAL_TABLET | Freq: Every day | ORAL | Status: DC

## 2014-06-01 MED ORDER — SPIRONOLACTONE 25 MG PO TABS: 25.0000 mg | ORAL_TABLET | Freq: Every day | ORAL | Status: DC

## 2014-06-01 MED ORDER — METOPROLOL TARTRATE 25 MG PO TABS: 25.0000 mg | ORAL_TABLET | Freq: Two times a day (BID) | ORAL | Status: DC

## 2014-06-01 MED ORDER — OLMESARTAN MEDOXOMIL 20 MG PO TABS: 20.0000 mg | ORAL_TABLET | Freq: Every day | ORAL | Status: DC

## 2014-06-01 MED ORDER — SIMVASTATIN 20 MG PO TABS: 20.0000 mg | ORAL_TABLET | Freq: Every day | ORAL | Status: DC

## 2014-06-01 NOTE — Telephone Encounter (Signed)
New message      Calling nurse to follow up on new medication

## 2014-06-01 NOTE — Telephone Encounter (Signed)
Patient cough improved off lisinopril. Finished samples of benicar provided by Dr. Tenny Craw at last visit, would like to continue. Authorized benicar 20 mg daily and refilled other cardiac meds per his request.

## 2014-06-01 NOTE — Telephone Encounter (Signed)
Error/pt hung up

## 2014-06-29 ENCOUNTER — Ambulatory Visit: Payer: No Typology Code available for payment source | Attending: Internal Medicine | Admitting: Internal Medicine

## 2014-06-29 VITALS — BP 151/83 | HR 74 | Temp 98.1°F | Resp 20

## 2014-06-29 DIAGNOSIS — IMO0001 Reserved for inherently not codable concepts without codable children: Secondary | ICD-10-CM

## 2014-06-29 DIAGNOSIS — J449 Chronic obstructive pulmonary disease, unspecified: Secondary | ICD-10-CM | POA: Insufficient documentation

## 2014-06-29 DIAGNOSIS — F17200 Nicotine dependence, unspecified, uncomplicated: Secondary | ICD-10-CM | POA: Insufficient documentation

## 2014-06-29 DIAGNOSIS — R51 Headache: Secondary | ICD-10-CM | POA: Insufficient documentation

## 2014-06-29 DIAGNOSIS — R040 Epistaxis: Secondary | ICD-10-CM | POA: Insufficient documentation

## 2014-06-29 DIAGNOSIS — R0981 Nasal congestion: Secondary | ICD-10-CM | POA: Insufficient documentation

## 2014-06-29 MED ORDER — TIOTROPIUM BROMIDE MONOHYDRATE 18 MCG IN CAPS: 18.0000 ug | ORAL_CAPSULE | Freq: Every day | RESPIRATORY_TRACT | Status: DC

## 2014-06-29 MED ORDER — FLUTICASONE PROPIONATE 50 MCG/ACT NA SUSP: 2.0000 | Freq: Every day | NASAL | Status: DC

## 2014-06-29 MED ORDER — BUDESONIDE-FORMOTEROL FUMARATE 160-4.5 MCG/ACT IN AERO: 2.0000 | INHALATION_SPRAY | Freq: Two times a day (BID) | RESPIRATORY_TRACT | Status: DC

## 2014-06-29 NOTE — Progress Notes (Signed)
Patient walked in with wife c/o 2 week hx of intermittent nose bleeds States bleeding is stopped with tissue Patient states he has not been blowing his nose but does report rubbing nose frequently Also c/o nasal congestion and headache Med list reviewed; patient states he does not take symbicort or spiriva due to cost States he ran out of flonase months ago Reports smoking 1 ppd; wants to quit Declined flu vaccine  BP 151/83 P 74 R 20 T 98.1 oral SpO2 96%  Patient instructed to avoid rubbing nose May use flonase for nasal congestion. Refill [facility]-scribed [facility] May apply small amount of vaseline to anterior nares to prevent capillary bleeding Make first available appt with PCP. Patient was due for 3 month f/u on 04/17/14   Agree with RN's assessment and intervention  Doris Cheadle, MD

## 2014-08-30 ENCOUNTER — Encounter: Payer: Self-pay | Admitting: Internal Medicine

## 2014-08-30 ENCOUNTER — Ambulatory Visit (INDEPENDENT_AMBULATORY_CARE_PROVIDER_SITE_OTHER): Payer: 59 | Admitting: Internal Medicine

## 2014-08-30 VITALS — BP 162/87 | HR 72 | Ht 75.0 in | Wt 216.0 lb

## 2014-08-30 DIAGNOSIS — I10 Essential (primary) hypertension: Secondary | ICD-10-CM

## 2014-08-30 DIAGNOSIS — R079 Chest pain, unspecified: Secondary | ICD-10-CM

## 2014-08-30 MED ORDER — OMEPRAZOLE 40 MG PO CPDR: 40.0000 mg | DELAYED_RELEASE_CAPSULE | Freq: Every day | ORAL | Status: DC

## 2014-08-30 NOTE — Patient Instructions (Signed)
Your physician recommends that you return for lab work in: today (BMET, CBC)  Your physician has requested that you have a lexiscan myoview. For further information please visit [email or web address] Please follow instruction sheet, as given.  Your physician has recommended you make the following change in your medication:  1.) START PRILOSEC 40 MG DAILY

## 2014-08-30 NOTE — Progress Notes (Signed)
Cardiology Office Note   Date:  08/30/2014   ID:  Eldean Nanna, DOB 26-Jul-1955, MRN [id number]  PCP:  Doris Cheadle, MD  Cardiologist:   Dietrich Pates, MD   Chief Complaint  Patient presents with  . 4 MO F/U VISIT      History of Present Illness: Jared [name] is a 59 y.o. male who has a histroy of HTN, tobacco abuse , CVa. Mild CAD (cath 2012).  Last echo LVEF 45 to 50%   I saw him last November 2015 SInce seen he has had intermittent CP  Discomfort is with an without activity  Occur sitting and lying .  Nagging SOB with spells  Last 30 min to 1 hour  Almost every day  No PND   No edema       Current Outpatient Prescriptions  Medication Sig Dispense Refill  . aspirin EC 81 MG EC tablet Take 1 tablet (81 mg total) by mouth daily. 30 tablet 0  . budesonide-formoterol (SYMBICORT) 160-4.5 MCG/ACT inhaler Inhale 2 puffs into the lungs 2 (two) times daily. 1 Inhaler 0  . fluticasone (FLONASE) 50 MCG/ACT nasal spray Place 2 sprays into both nostrils daily. 16 g 0  . furosemide (LASIX) 40 MG tablet Take 1 tablet (40 mg total) by mouth daily. 90 tablet 3  . metoprolol tartrate (LOPRESSOR) 25 MG tablet Take 1 tablet (25 mg total) by mouth 2 (two) times daily. 180 tablet 3  . olmesartan (BENICAR) 20 MG tablet Take 1 tablet (20 mg total) by mouth daily. 90 tablet 3  . simvastatin (ZOCOR) 20 MG tablet Take 1 tablet (20 mg total) by mouth daily at 6 PM. 90 tablet 3  . spironolactone (ALDACTONE) 25 MG tablet Take 1 tablet (25 mg total) by mouth daily. 90 tablet 3  . tiotropium (SPIRIVA) 18 MCG inhalation capsule Place 1 capsule (18 mcg total) into inhaler and inhale daily. 30 capsule 0   No current facility-administered medications for this visit.    Allergies:   Lisinopril   Past Medical History  Diagnosis Date  . Hypertension   . Stroke   . Shortness of breath   . Headache(784.0)   . Systolic heart failure   . CAD (coronary artery disease)   . Tobacco abuse     Past Surgical  History  Procedure Laterality Date  . Cardiac catheterization  10/2010    LM normal, LAD with 20% irregularities, LCX with 20%, RCA with 40% prox and 40% distal - EF of 35%     Social History:  The patient  reports that he has been smoking Cigarettes.  He has a 80 pack-year smoking history. He does not have any smokeless tobacco history on file. He reports that he drinks alcohol. He reports that he does not use illicit drugs.   Family History:  The patient's family history includes Cancer in his father and mother; Diabetes in his brother, father, mother, and sister; Heart disease in his mother.    ROS:  Please see the history of present illness. All other systems are reviewed and  Negative to the above problem except as noted.    PHYSICAL EXAM: VS:  BP 162/87 mmHg  Pulse 72  Ht  (1.905 m)  Wt 216 lb (97.977 kg)  BMI 27.00 kg/m2  GEN: Well nourished, well developed, in no acute distress HEENT: normal Neck: no JVD, carotid bruits, or masses Cardiac: RRR; no murmurs, rubs, or gallops,no edema  Respiratory:  clear to auscultation bilaterally, normal  work of breathing GI: soft, nontender, nondistended, + BS  No hepatomegaly  MS: no deformity Moving all extremities   Skin: warm and dry, no rash Neuro:  Strength and sensation are intact Psych: euthymic mood, full affect   EKG:  EKG is ordered today.   Lipid Panel    Component Value Date/Time   CHOL 170 10/04/2013 0502   TRIG 65 10/04/2013 0502   HDL 51 10/04/2013 0502   CHOLHDL 3.3 10/04/2013 0502   VLDL 13 10/04/2013 0502   LDLCALC 106* 10/04/2013 0502      Wt Readings from Last 3 Encounters:  08/30/14 216 lb (97.977 kg)  04/30/14 208 lb 12.8 oz (94.711 kg)  02/26/14 206 lb 12.8 oz (93.804 kg)      ASSESSMENT AND PLAN: 1  CP  Atypical for ischemia  But would recomm lexiscan myoview   2.  HTN  Will need to be followed  High today  3.  CAD  As noted in 1  4.  Nonischemic CM  Mild LV dysfunciton on last echo   Volume status looks good  Keep on same regimen     Current medicines are reviewed at length with the patient today.  The patient does not have concerns regarding medicines.  The following changes have been made: Would try addition of PPI    Labs/ tests ordered today include:  LAbs and lexiscan myoview No orders of the defined types were placed in this encounter.     Disposition:   FU with me based on stress results   Signed, Dietrich Pates, MD  08/30/2014 4:01 PM    [facility] [address] [place], [place], [place]  [address] Phone: [phone]; Fax: [phone]

## 2014-08-31 LAB — BASIC METABOLIC PANEL
BUN: 14 mg/dL (ref 6–23)
CO2: 33 mEq/L — ABNORMAL HIGH (ref 19–32)
Calcium: 9 mg/dL (ref 8.4–10.5)
Chloride: 106 mEq/L (ref 96–112)
Creatinine, Ser: 1.02 mg/dL (ref 0.40–1.50)
GFR: 95.53 mL/min (ref >60.00)
Glucose, Bld: 79 mg/dL (ref 70–99)
Potassium: 3.7 mEq/L (ref 3.5–5.1)
Sodium: 142 mEq/L (ref 135–145)

## 2014-08-31 LAB — CBC
HCT: 45.5 % (ref 39.0–52.0)
Hemoglobin: 15.1 g/dL (ref 13.0–17.0)
MCHC: 33.2 g/dL (ref 30.0–36.0)
MCV: 88.6 fl (ref 78.0–100.0)
Platelets: 181 10*3/uL (ref 150.0–400.0)
RBC: 5.14 Mil/uL (ref 4.22–5.81)
RDW: 14.5 % (ref 11.5–15.5)
WBC: 8.7 10*3/uL (ref 4.0–10.5)

## 2014-09-02 ENCOUNTER — Other Ambulatory Visit: Payer: Self-pay | Admitting: *Deleted

## 2014-09-02 ENCOUNTER — Other Ambulatory Visit: Payer: Self-pay | Admitting: Internal Medicine

## 2014-09-10 ENCOUNTER — Ambulatory Visit (HOSPITAL_COMMUNITY): Payer: 59 | Attending: Cardiology | Admitting: Radiology

## 2014-09-10 DIAGNOSIS — R079 Chest pain, unspecified: Secondary | ICD-10-CM | POA: Diagnosis present

## 2014-09-10 DIAGNOSIS — I10 Essential (primary) hypertension: Secondary | ICD-10-CM

## 2014-09-10 MED ORDER — TECHNETIUM TC 99M SESTAMIBI GENERIC - CARDIOLITE
11.0000 | Freq: Once | INTRAVENOUS | Status: AC | PRN
  Administered 2014-09-10: 11 via INTRAVENOUS

## 2014-09-10 MED ORDER — REGADENOSON 0.4 MG/5ML IV SOLN
0.4000 mg | Freq: Once | INTRAVENOUS | Status: AC
  Administered 2014-09-10: 0.4 mg via INTRAVENOUS

## 2014-09-10 MED ORDER — TECHNETIUM TC 99M SESTAMIBI GENERIC - CARDIOLITE
30.0000 | Freq: Once | INTRAVENOUS | Status: AC | PRN
  Administered 2014-09-10: 30 via INTRAVENOUS

## 2014-09-10 NOTE — Progress Notes (Signed)
[facility] SITE 3 NUCLEAR MED [address] [place], [place] [address] [phone]    Cardiology Nuclear Med Study  Jared [name] is a 59 y.o. male     MRN : [id number]     DOB: 23-Oct-1955  Procedure Date: 09/10/2014  Nuclear Med Background Indication for Stress Test:  Evaluation for Ischemia and Follow up CAD History:  CAD, MPI (?), COPD Cardiac Risk Factors: Hypertension and Smoker  Symptoms:  Chest Pain (last date of chest discomfort is currently about a 2/10) and SOB   Nuclear Pre-Procedure Caffeine/Decaff Intake:  None> 12 hrs NPO After: 12:30pm yesterday   Lungs:  clear O2 Sat: 92% on room air. IV 0.9% NS with Angio Cath:  20g  IV Site: R Wrist x 1, tolerated well IV Started by:  Irean Hong, RN  Chest Size (in):  44 Cup Size: n/a  Height:  (1.905 m)  Weight:  208 lb (94.348 kg)  BMI:  Body mass index is 26 kg/(m^2). Tech Comments:  No medications yesterday or today per patient except used Inhaler last night. Irean Hong, RN.    Nuclear Med Study 1 or 2 day study: 1 day  Stress Test Type:  Lexiscan  Reading MD: N/A  Order Authorizing Provider:  Dietrich Pates, MD  Resting Radionuclide: Technetium 63m Sestamibi  Resting Radionuclide Dose: 11.0 mCi   Stress Radionuclide:  Technetium 13m Sestamibi  Stress Radionuclide Dose: 33.0 mCi           Stress Protocol Rest HR: 73 Stress HR: 93  Rest BP: 170/111 Stress BP: 149/79  Exercise Time (min): n/a METS: n/a   Predicted Max HR: 160 bpm % Max HR: 58.12 bpm Rate Pressure Product: 8556   Dose of Adenosine (mg):  n/a Dose of Lexiscan: 0.4 mg  Dose of Atropine (mg): n/a Dose of Dobutamine: n/a mcg/kg/min (at max HR)  Stress Test Technologist: Nelson Chimes, BS-ES  Nuclear Technologist:  Kerby Nora, CNMT     Rest Procedure:  Myocardial perfusion imaging was performed at rest 45 minutes following the intravenous administration of Technetium 56m Sestamibi. Rest ECG: NSR - Normal EKG, occasional PACs    Stress Procedure:  The patient received IV Lexiscan 0.4 mg over 15-seconds.  Technetium 73m Sestamibi injected at 30-seconds.  Quantitative spect images were obtained after a 45 minute delay.  During the infusion of Lexiscan the patient complained of SOB that resolved in recovery.  Stress ECG: No significant change from baseline ECG  QPS Raw Data Images:  Normal; no motion artifact; normal heart/lung ratio. Stress Images:  The LV is enlarged.  There is smooth and homogeneous uptake in all areas of the LV     Rest Images:  The LV is enlarged.  There is smooth and homogeneous uptake in all areas of the LV  Subtraction (SDS):  No evidence of ischemia. Transient Ischemic Dilatation (Normal <1.22):  1.15 Lung/Heart Ratio (Normal <0.45):  0.22  Quantitative Gated Spect Images QGS EDV:  233 ml QGS ESV:  163 ml  Impression Exercise Capacity:  Lexiscan with no exercise. BP Response:  Normal blood pressure response. Clinical Symptoms:  No significant symptoms noted. ECG Impression:  No significant ST segment change suggestive of ischemia. Comparison with Prior Nuclear Study: No images to compare  Overall Impression:  High risk stress nuclear study There is no evidence of ischemia.  There is severe LV dysfunction. .  LV Ejection Fraction: 30%.  LV Wall Motion:  There is global LV hypokinesis.  Jared [name], Jared Hageman., MD, [facility] 09/10/2014, 4:25 PM [address],  [address] Office [phone] Pager [phone]

## 2014-09-14 ENCOUNTER — Telehealth: Payer: Self-pay | Admitting: *Deleted

## 2014-09-14 DIAGNOSIS — I420 Dilated cardiomyopathy: Secondary | ICD-10-CM

## 2014-09-14 NOTE — Telephone Encounter (Signed)
Notes Recorded by Pricilla Riffle, MD on 09/14/2014 at 3:20 PM Stress test shows normal blood flow to the heart. Would get limited echo to reconfirm pumping function. Was mildly down on last echo.             ORDER PLACED FOR ECHO. STAFF MESSAGE SENT TO [facility] TO SCHEDULE. CALLED PATIENT TO INFORM.  VERBALIZES UNDERSTANDING AND AGREEMENT.

## 2014-09-20 ENCOUNTER — Ambulatory Visit (HOSPITAL_COMMUNITY): Payer: 59 | Attending: Cardiology | Admitting: Cardiology

## 2014-09-20 DIAGNOSIS — I10 Essential (primary) hypertension: Secondary | ICD-10-CM | POA: Insufficient documentation

## 2014-09-20 DIAGNOSIS — I429 Cardiomyopathy, unspecified: Secondary | ICD-10-CM | POA: Diagnosis present

## 2014-09-20 DIAGNOSIS — I2510 Atherosclerotic heart disease of native coronary artery without angina pectoris: Secondary | ICD-10-CM | POA: Diagnosis not present

## 2014-09-20 DIAGNOSIS — I420 Dilated cardiomyopathy: Secondary | ICD-10-CM

## 2014-09-20 DIAGNOSIS — I509 Heart failure, unspecified: Secondary | ICD-10-CM | POA: Insufficient documentation

## 2014-09-20 NOTE — Progress Notes (Signed)
Limited echo performed. 

## 2014-12-12 ENCOUNTER — Emergency Department (HOSPITAL_COMMUNITY): Payer: Commercial Managed Care - HMO

## 2014-12-12 ENCOUNTER — Emergency Department (HOSPITAL_COMMUNITY)
Admission: EM | Admit: 2014-12-12 | Discharge: 2014-12-12 | Disposition: A | Discharge status: Home / Self Care | Payer: Commercial Managed Care - HMO | Attending: Emergency Medicine | Admitting: Emergency Medicine

## 2014-12-12 ENCOUNTER — Encounter (HOSPITAL_COMMUNITY): Payer: Self-pay | Admitting: Nurse Practitioner

## 2014-12-12 DIAGNOSIS — S299XXA Unspecified injury of thorax, initial encounter: Secondary | ICD-10-CM | POA: Insufficient documentation

## 2014-12-12 DIAGNOSIS — I5020 Unspecified systolic (congestive) heart failure: Secondary | ICD-10-CM | POA: Diagnosis not present

## 2014-12-12 DIAGNOSIS — Z8673 Personal history of transient ischemic attack (TIA), and cerebral infarction without residual deficits: Secondary | ICD-10-CM | POA: Diagnosis not present

## 2014-12-12 DIAGNOSIS — S161XXA Strain of muscle, fascia and tendon at neck level, initial encounter: Secondary | ICD-10-CM | POA: Diagnosis not present

## 2014-12-12 DIAGNOSIS — Z7951 Long term (current) use of inhaled steroids: Secondary | ICD-10-CM | POA: Insufficient documentation

## 2014-12-12 DIAGNOSIS — Y9289 Other specified places as the place of occurrence of the external cause: Secondary | ICD-10-CM | POA: Insufficient documentation

## 2014-12-12 DIAGNOSIS — Y998 Other external cause status: Secondary | ICD-10-CM | POA: Insufficient documentation

## 2014-12-12 DIAGNOSIS — Z79899 Other long term (current) drug therapy: Secondary | ICD-10-CM | POA: Insufficient documentation

## 2014-12-12 DIAGNOSIS — Y9389 Activity, other specified: Secondary | ICD-10-CM | POA: Diagnosis not present

## 2014-12-12 DIAGNOSIS — Z720 Tobacco use: Secondary | ICD-10-CM | POA: Insufficient documentation

## 2014-12-12 DIAGNOSIS — S4992XA Unspecified injury of left shoulder and upper arm, initial encounter: Secondary | ICD-10-CM | POA: Diagnosis not present

## 2014-12-12 DIAGNOSIS — Z9889 Other specified postprocedural states: Secondary | ICD-10-CM | POA: Insufficient documentation

## 2014-12-12 DIAGNOSIS — I10 Essential (primary) hypertension: Secondary | ICD-10-CM | POA: Insufficient documentation

## 2014-12-12 DIAGNOSIS — X58XXXA Exposure to other specified factors, initial encounter: Secondary | ICD-10-CM | POA: Diagnosis not present

## 2014-12-12 DIAGNOSIS — S199XXA Unspecified injury of neck, initial encounter: Secondary | ICD-10-CM | POA: Diagnosis present

## 2014-12-12 DIAGNOSIS — I2510 Atherosclerotic heart disease of native coronary artery without angina pectoris: Secondary | ICD-10-CM | POA: Insufficient documentation

## 2014-12-12 DIAGNOSIS — Z7982 Long term (current) use of aspirin: Secondary | ICD-10-CM | POA: Insufficient documentation

## 2014-12-12 MED ORDER — NAPROXEN 500 MG PO TABS: 500.0000 mg | ORAL_TABLET | Freq: Two times a day (BID) | ORAL | Status: DC

## 2014-12-12 MED ORDER — METHOCARBAMOL 500 MG PO TABS: 500.0000 mg | ORAL_TABLET | Freq: Four times a day (QID) | ORAL | Status: DC

## 2014-12-12 MED ORDER — METHOCARBAMOL 500 MG PO TABS
500.0000 mg | ORAL_TABLET | Freq: Once | ORAL | Status: AC
  Administered 2014-12-12: 500 mg via ORAL
  Filled 2014-12-12: qty 1

## 2014-12-12 NOTE — ED Provider Notes (Signed)
CSN: [id number]     Arrival date & time 12/12/14  1224 History  This chart was scribed for non-physician practitioner, Jaynie Crumble, PA-C working with Mancel Bale, MD by Freida Busman, ED Scribe. This patient was seen in room TR07C/TR07C and the patient's care was started at 2:05 PM.     Chief Complaint  Patient presents with  . Neck Pain    The history is provided by the patient. No language interpreter was used.    HPI Comments:  Jared [name] is a 59 y.o. male who presents to the Emergency Department complaining of moderate neck pain that radiates down to his left shoulder and upper back for 3 days. Pt was shaking his head vigorously to avoid being stung by bees that ended up stinging him on the top of his head and his left face 4 days ago; notes his current pain started the next day. He denies numbness, and tingling. He has taken benadryl after initially being stung, and ibuprofen for pain with little relief.   Past Medical History  Diagnosis Date  . Hypertension   . Stroke   . Shortness of breath   . Headache(784.0)   . Systolic heart failure   . CAD (coronary artery disease)   . Tobacco abuse    Past Surgical History  Procedure Laterality Date  . Cardiac catheterization  10/2010    LM normal, LAD with 20% irregularities, LCX with 20%, RCA with 40% prox and 40% distal - EF of 35%   Family History  Problem Relation Age of Onset  . Cancer Mother   . Heart disease Mother   . Diabetes Mother   . Cancer Father   . Diabetes Father   . Diabetes Sister   . Diabetes Brother    History  Substance Use Topics  . Smoking status: Current Every Day Smoker -- 2.00 packs/day for 40 years    Types: Cigarettes  . Smokeless tobacco: Not on file     Comment: Smoking 1ppd  . Alcohol Use: 0.0 oz/week    0 Standard drinks or equivalent per week     Comment: occasionally    Review of Systems  Constitutional: Negative for fever and chills.  Musculoskeletal: Positive for back pain  and neck pain.       Shoulder Pain   Skin: Negative for wound.  Neurological: Negative for weakness and numbness.      Allergies  Lisinopril  Home Medications   Prior to Admission medications   Medication Sig Start Date End Date Taking? Authorizing Provider  aspirin EC 81 MG EC tablet Take 1 tablet (81 mg total) by mouth daily. 10/08/13   Marinda Elk, MD  fluticasone (FLONASE) 50 MCG/ACT nasal spray Place 2 sprays into both nostrils daily. 06/29/14   Doris Cheadle, MD  furosemide (LASIX) 40 MG tablet Take 1 tablet (40 mg total) by mouth daily. 06/01/14   Pricilla Riffle, MD  metoprolol tartrate (LOPRESSOR) 25 MG tablet Take 1 tablet (25 mg total) by mouth 2 (two) times daily. 06/01/14   Pricilla Riffle, MD  olmesartan (BENICAR) 20 MG tablet Take 1 tablet (20 mg total) by mouth daily. 06/01/14   Pricilla Riffle, MD  omeprazole (PRILOSEC) 40 MG capsule Take 1 capsule (40 mg total) by mouth daily. 08/30/14   Pricilla Riffle, MD  simvastatin (ZOCOR) 20 MG tablet Take 1 tablet (20 mg total) by mouth daily at 6 PM. 06/01/14   Pricilla Riffle, MD  spironolactone (ALDACTONE)  25 MG tablet Take 1 tablet (25 mg total) by mouth daily. 06/01/14   Pricilla Riffle, MD  SYMBICORT 160-4.5 MCG/ACT inhaler INHALE 2 PUFFS INTO THE LUNGS 2 TIMES DAILY. 09/10/14   Doris Cheadle, MD  tiotropium (SPIRIVA) 18 MCG inhalation capsule Place 1 capsule (18 mcg total) into inhaler and inhale daily. 06/29/14   Doris Cheadle, MD  VENTOLIN HFA 108 (90 BASE) MCG/ACT inhaler INHALE 2 PUFFS EVERY 6 HOURS AS NEEDED FOR WHEEZING OR SHORTNESS OF BREATH 09/10/14   Deepak Advani, MD   BP 175/90 mmHg  Pulse 73  Temp(Src) 98.1 F (36.7 C) (Oral)  Resp 16  Ht  (1.905 m)  Wt 193 lb 4.8 oz (87.68 kg)  BMI 24.16 kg/m2  SpO2 97% Physical Exam  Constitutional: He appears well-developed and well-nourished. No distress.  HENT:  Head: Normocephalic and atraumatic.  Eyes: Conjunctivae are normal.  Neck: Normal range of motion.  No midline  tenderness to palpation. No deformity step-offs. Tender to palpation over left trapezius and left parascapular muscles. Pain with range of motion of the neck, full range motion of the neck.  Cardiovascular: Normal rate.   Pulmonary/Chest: Effort normal.  Musculoskeletal: Normal range of motion.  Neurological: He is alert.  55 and equal strength of bilateral upper extremities specifically biceps strength against resistance, triceps strength, grip strength. 5 out of 5 and equal lower extremity strength bilaterally. Gait is normal.  Skin: Skin is warm and dry.  Nursing note and vitals reviewed.   ED Course  Procedures   DIAGNOSTIC STUDIES:  Oxygen Saturation is 96% on RA, normal by my interpretation.    COORDINATION OF CARE:  2:11 PM Discussed treatment plan with pt at bedside and pt agreed to plan.  Labs Review Labs Reviewed - No data to display  Imaging Review Dg Cervical Spine Complete  12/12/2014   CLINICAL DATA:  Cervicalgia/neck pain.  EXAM: CERVICAL SPINE  4+ VIEWS  COMPARISON:  None.  FINDINGS: Moderate mid cervical spondylosis is present. Disc space narrowing is present at C5-C6, C6-C7 and to a lesser extent at C7-T1. The alignment shows straightening of the normal cervical lordosis. The prevertebral soft tissues are normal. Oblique view show C5-C6 bony foraminal stenosis, symmetric. There is also RIGHT C7-T1 bony foraminal stenosis due to uncovertebral spurring that potentially affects the RIGHT C8 nerve. Cervicothoracic junction appears normal. Odontoid appears normal. Carotid atherosclerosis incidentally noted.  IMPRESSION: Moderate mid cervical degenerative disc disease. No acute abnormality   Electronically Signed   By: Andreas Newport M.D.   On: 12/12/2014 15:04     EKG Interpretation None      MDM   Final diagnoses:  Cervical strain, initial encounter    patient with intermittent neck pain, states he really at home. He was in tears because how severe pain was,  currently is much improved. Pain is mainly muscular on exam over left trapezius muscle. X-rays negative except for arthritis. No neuro deficits and normal strength in extremities. Most likely muscle spasms. Will start Robaxin, naproxen, follow-up with primary care doctor.  Filed Vitals:   12/12/14 1235 12/12/14 1238 12/12/14 1618  BP:  175/90 195/98  Pulse:  73 63  Temp:  98.1 F (36.7 C) 97.8 F (36.6 C)  TempSrc:  Oral Oral  Resp:  16 16  Height:  (1.905 m)    Weight: 193 lb 4.8 oz (87.68 kg)    SpO2:  97% 100%    I personally performed the services described in this  documentation, which was scribed in my presence. The recorded information has bThere is a een reviewed and is accurate.   Jaynie Crumble, PA-C 12/12/14 1648  Mancel Bale, MD 12/12/14 [phone]

## 2014-12-12 NOTE — ED Notes (Signed)
He was swatting at bees Friday and noticed pain in his neck and upper back since. The pain has been getting worse since. He tried ibuprofen with some relief but pain returned. Ambulatory, mae

## 2014-12-12 NOTE — ED Notes (Signed)
Declined W/C at D/C and was escorted to lobby by RN. 

## 2014-12-12 NOTE — Discharge Instructions (Signed)
Naprosyn for pain. Robaxin for spasms. Try heating pads. Stretches. Follow up with primary care doctor   Cervical Sprain A cervical sprain is an injury in the neck in which the strong, fibrous tissues (ligaments) that connect your neck bones stretch or tear. Cervical sprains can range from mild to severe. Severe cervical sprains can cause the neck vertebrae to be unstable. This can lead to damage of the spinal cord and can result in serious nervous system problems. The amount of time it takes for a cervical sprain to get better depends on the cause and extent of the injury. Most cervical sprains heal in 1 to 3 weeks. CAUSES  Severe cervical sprains may be caused by:   Contact sport injuries (such as from football, rugby, wrestling, hockey, auto racing, gymnastics, diving, martial arts, or boxing).   Motor vehicle collisions.   Whiplash injuries. This is an injury from a sudden forward and backward whipping movement of the head and neck.  Falls.  Mild cervical sprains may be caused by:   Being in an awkward position, such as while cradling a telephone between your ear and shoulder.   Sitting in a chair that does not offer proper support.   Working at a poorly Marketing executive station.   Looking up or down for long periods of time.  SYMPTOMS   Pain, soreness, stiffness, or a burning sensation in the front, back, or sides of the neck. This discomfort may develop immediately after the injury or slowly, 24 hours or more after the injury.   Pain or tenderness directly in the middle of the back of the neck.   Shoulder or upper back pain.   Limited ability to move the neck.   Headache.   Dizziness.   Weakness, numbness, or tingling in the hands or arms.   Muscle spasms.   Difficulty swallowing or chewing.   Tenderness and swelling of the neck.  DIAGNOSIS  Most of the time your health care provider can diagnose a cervical sprain by taking your history and doing  a physical exam. Your health care provider will ask about previous neck injuries and any known neck problems, such as arthritis in the neck. X-rays may be taken to find out if there are any other problems, such as with the bones of the neck. Other tests, such as a CT scan or MRI, may also be needed.  TREATMENT  Treatment depends on the severity of the cervical sprain. Mild sprains can be treated with rest, keeping the neck in place (immobilization), and pain medicines. Severe cervical sprains are immediately immobilized. Further treatment is done to help with pain, muscle spasms, and other symptoms and may include:  Medicines, such as pain relievers, numbing medicines, or muscle relaxants.   Physical therapy. This may involve stretching exercises, strengthening exercises, and posture training. Exercises and improved posture can help stabilize the neck, strengthen muscles, and help stop symptoms from returning.  HOME CARE INSTRUCTIONS   Put ice on the injured area.   Put ice in a plastic bag.   Place a towel between your skin and the bag.   Leave the ice on for 15-20 minutes, 3-4 times a day.   If your injury was severe, you may have been given a cervical collar to wear. A cervical collar is a two-piece collar designed to keep your neck from moving while it heals.  Do not remove the collar unless instructed by your health care provider.  If you have long hair, keep it  outside of the collar.  Ask your health care provider before making any adjustments to your collar. Minor adjustments may be required over time to improve comfort and reduce pressure on your chin or on the back of your head.  Ifyou are allowed to remove the collar for cleaning or bathing, follow your health care provider's instructions on how to do so safely.  Keep your collar clean by wiping it with mild soap and water and drying it completely. If the collar you have been given includes removable pads, remove them  every 1-2 days and hand wash them with soap and water. Allow them to air dry. They should be completely dry before you wear them in the collar.  If you are allowed to remove the collar for cleaning and bathing, wash and dry the skin of your neck. Check your skin for irritation or sores. If you see any, tell your health care provider.  Do not drive while wearing the collar.   Only take over-the-counter or prescription medicines for pain, discomfort, or fever as directed by your health care provider.   Keep all follow-up appointments as directed by your health care provider.   Keep all physical therapy appointments as directed by your health care provider.   Make any needed adjustments to your workstation to promote good posture.   Avoid positions and activities that make your symptoms worse.   Warm up and stretch before being active to help prevent problems.  SEEK MEDICAL CARE IF:   Your pain is not controlled with medicine.   You are unable to decrease your pain medicine over time as planned.   Your activity level is not improving as expected.  SEEK IMMEDIATE MEDICAL CARE IF:   You develop any bleeding.  You develop stomach upset.  You have signs of an allergic reaction to your medicine.   Your symptoms get worse.   You develop new, unexplained symptoms.   You have numbness, tingling, weakness, or paralysis in any part of your body.  MAKE SURE YOU:   Understand these instructions.  Will watch your condition.  Will get help right away if you are not doing well or get worse. Document Released: 03/25/2007 Document Revised: 06/02/2013 Document Reviewed: 12/03/2012 [facility] Patient Information 2015 [place], [place]. This information is not intended to replace advice given to you by your health care provider. Make sure you discuss any questions you have with your health care provider.

## 2015-01-25 ENCOUNTER — Encounter: Payer: Self-pay | Admitting: Pharmacist

## 2015-05-13 ENCOUNTER — Encounter: Payer: Self-pay | Admitting: Internal Medicine

## 2015-05-13 ENCOUNTER — Ambulatory Visit (INDEPENDENT_AMBULATORY_CARE_PROVIDER_SITE_OTHER): Payer: Commercial Managed Care - HMO | Admitting: Internal Medicine

## 2015-05-13 VITALS — BP 158/88 | HR 84 | Ht 75.0 in | Wt 174.1 lb

## 2015-05-13 DIAGNOSIS — I420 Dilated cardiomyopathy: Secondary | ICD-10-CM

## 2015-05-13 DIAGNOSIS — R000 Tachycardia, unspecified: Secondary | ICD-10-CM | POA: Diagnosis not present

## 2015-05-13 DIAGNOSIS — I429 Cardiomyopathy, unspecified: Secondary | ICD-10-CM

## 2015-05-13 MED ORDER — ATORVASTATIN CALCIUM 20 MG PO TABS: 10.0000 mg | ORAL_TABLET | Freq: Every day | ORAL | Status: DC

## 2015-05-13 MED ORDER — LOSARTAN POTASSIUM 50 MG PO TABS: 50.0000 mg | ORAL_TABLET | Freq: Every day | ORAL | Status: DC

## 2015-05-13 NOTE — Progress Notes (Signed)
Cardiology Office Note   Date:  05/13/2015   ID:  Jared [name], DOB 1955/06/26, MRN [id number]  PCP:  Doris Cheadle, MD  Cardiologist:   Dietrich Pates, MD   F/U of systolic CHF   History of Present Illness: Jared [name] is a 59 y.o. male with a history of HTN, tobacco abuse, mild CAD, CVA.  LVEf 45 to 50%  I saw him in clinic in March 2016.  Had some chest tightness  I recomm a Lexiscan myoview  This showed normal perfusion  Echo showed LVEF 40 to 45%.    SInce seen, breathing is  about the same  Gets SOB with walking  Works in [employer]  On feet all the time  No edema   Had CP about 1 wk ago  Mild  QUick  None since   No dizziness Does note HR is up   Pt stopped all meds about 1 month ago  Says he has a hard time paying Does smoke 3/4 ppd cigarettes though  Current Outpatient Prescriptions  Medication Sig Dispense Refill  . aspirin EC 81 MG EC tablet Take 1 tablet (81 mg total) by mouth daily. (Patient not taking: Reported on 05/13/2015) 30 tablet 0  . fluticasone (FLONASE) 50 MCG/ACT nasal spray Place 2 sprays into both nostrils daily. (Patient not taking: Reported on 05/13/2015) 16 g 0  . furosemide (LASIX) 40 MG tablet Take 1 tablet (40 mg total) by mouth daily. (Patient not taking: Reported on 05/13/2015) 90 tablet 3  . methocarbamol (ROBAXIN) 500 MG tablet Take 1 tablet (500 mg total) by mouth 4 (four) times daily. (Patient not taking: Reported on 05/13/2015) 20 tablet 0  . metoprolol tartrate (LOPRESSOR) 25 MG tablet Take 1 tablet (25 mg total) by mouth 2 (two) times daily. (Patient not taking: Reported on 05/13/2015) 180 tablet 3  . naproxen (NAPROSYN) 500 MG tablet Take 1 tablet (500 mg total) by mouth 2 (two) times daily. (Patient not taking: Reported on 05/13/2015) 30 tablet 0  . olmesartan (BENICAR) 20 MG tablet Take 1 tablet (20 mg total) by mouth daily. (Patient not taking: Reported on 05/13/2015) 90 tablet 3  . omeprazole (PRILOSEC) 40 MG capsule Take 1 capsule (40 mg total)  by mouth daily. (Patient not taking: Reported on 05/13/2015) 30 capsule 11  . simvastatin (ZOCOR) 20 MG tablet Take 1 tablet (20 mg total) by mouth daily at 6 PM. (Patient not taking: Reported on 05/13/2015) 90 tablet 3  . spironolactone (ALDACTONE) 25 MG tablet Take 1 tablet (25 mg total) by mouth daily. (Patient not taking: Reported on 05/13/2015) 90 tablet 3  . SYMBICORT 160-4.5 MCG/ACT inhaler INHALE 2 PUFFS INTO THE LUNGS 2 TIMES DAILY. (Patient not taking: Reported on 05/13/2015) 1 g 0  . tiotropium (SPIRIVA) 18 MCG inhalation capsule Place 1 capsule (18 mcg total) into inhaler and inhale daily. (Patient not taking: Reported on 05/13/2015) 30 capsule 0  . VENTOLIN HFA 108 (90 BASE) MCG/ACT inhaler INHALE 2 PUFFS EVERY 6 HOURS AS NEEDED FOR WHEEZING OR SHORTNESS OF BREATH (Patient not taking: Reported on 05/13/2015) 18 g 0   No current facility-administered medications for this visit.    Allergies:   Lisinopril   Past Medical History  Diagnosis Date  . Hypertension   . Stroke (HCC)   . Shortness of breath   . Headache(784.0)   . Systolic heart failure (HCC)   . CAD (coronary artery disease)   . Tobacco abuse     Past Surgical History  Procedure Laterality Date  . Cardiac catheterization  10/2010    LM normal, LAD with 20% irregularities, LCX with 20%, RCA with 40% prox and 40% distal - EF of 35%     Social History:  The patient  reports that he has been smoking Cigarettes.  He has a 80 pack-year smoking history. He does not have any smokeless tobacco history on file. He reports that he drinks alcohol. He reports that he does not use illicit drugs.   Family History:  The patient's family history includes Cancer in his father and mother; Diabetes in his brother, father, mother, and sister; Heart disease in his mother.    ROS:  Please see the history of present illness. All other systems are reviewed and  Negative to the above problem except as noted.    PHYSICAL EXAM: VS:  BP  158/88 mmHg  Pulse 84  Ht  (1.905 m)  Wt 78.98 kg (174 lb 1.9 oz)  BMI 21.76 kg/m2  GEN: Well nourished, well developed, in no acute distress HEENT: normal Neck: no JVD, carotid bruits, or masses Cardiac: RRR; no murmurs, rubs, or gallops,no edema  Respiratory:  clear to auscultation bilaterally, normal work of breathing GI: soft, nontender, nondistended, + BS  No hepatomegaly  MS: no deformity Moving all extremities   Skin: warm and dry, no rash Neuro:  Strength and sensation are intact Psych: euthymic mood, full affect   EKG:  EKG is ordered today.  SR 82 bpm  Occasional PVC     Lipid Panel    Component Value Date/Time   CHOL 170 10/04/2013 0502   TRIG 65 10/04/2013 0502   HDL 51 10/04/2013 0502   CHOLHDL 3.3 10/04/2013 0502   VLDL 13 10/04/2013 0502   LDLCALC 106* 10/04/2013 0502      Wt Readings from Last 3 Encounters:  05/13/15 78.98 kg (174 lb 1.9 oz)  12/12/14 87.68 kg (193 lb 4.8 oz)  09/10/14 94.348 kg (208 lb)      ASSESSMENT AND PLAN:  1.  Chronic systolic CHF  Volume status looks good  I did discuss medical RX of CHF with pt  I would reocmm that he take  Metoprolol, cozaar and ASA  2.  Mild CAD  No Symptoms to suggest angina  Would resume a statin  Recomm 1/2 of Lipitor 20  3.  HTN  WIll need to be followed once he is back on meds.     F/U with me in 2 months     Signed, Dietrich Pates, MD  05/13/2015 3:26 PM    [facility] [address] [place], [place], [place]  [address] Phone: [phone]; Fax: [phone]

## 2015-05-13 NOTE — Patient Instructions (Signed)
Your physician has recommended you make the following change in your medication:  We have discontinued all the medications you were not taking. Please take the medications on this list as prescribed.  Your physician recommends that you schedule a follow-up appointment in: 2 months with Dr. Tenny Craw

## 2015-06-13 ENCOUNTER — Other Ambulatory Visit: Payer: Self-pay

## 2015-06-13 ENCOUNTER — Emergency Department (HOSPITAL_COMMUNITY)
Admission: EM | Admit: 2015-06-13 | Discharge: 2015-06-13 | Disposition: A | Discharge status: Home / Self Care | Payer: Commercial Managed Care - HMO | Attending: Emergency Medicine | Admitting: Emergency Medicine

## 2015-06-13 ENCOUNTER — Emergency Department (HOSPITAL_COMMUNITY): Payer: Commercial Managed Care - HMO

## 2015-06-13 ENCOUNTER — Encounter (HOSPITAL_COMMUNITY): Payer: Self-pay | Admitting: Family Medicine

## 2015-06-13 DIAGNOSIS — J441 Chronic obstructive pulmonary disease with (acute) exacerbation: Secondary | ICD-10-CM

## 2015-06-13 DIAGNOSIS — F17210 Nicotine dependence, cigarettes, uncomplicated: Secondary | ICD-10-CM | POA: Insufficient documentation

## 2015-06-13 DIAGNOSIS — Z8673 Personal history of transient ischemic attack (TIA), and cerebral infarction without residual deficits: Secondary | ICD-10-CM | POA: Insufficient documentation

## 2015-06-13 DIAGNOSIS — I5020 Unspecified systolic (congestive) heart failure: Secondary | ICD-10-CM | POA: Insufficient documentation

## 2015-06-13 DIAGNOSIS — Z79899 Other long term (current) drug therapy: Secondary | ICD-10-CM | POA: Insufficient documentation

## 2015-06-13 DIAGNOSIS — I2510 Atherosclerotic heart disease of native coronary artery without angina pectoris: Secondary | ICD-10-CM | POA: Insufficient documentation

## 2015-06-13 DIAGNOSIS — I10 Essential (primary) hypertension: Secondary | ICD-10-CM | POA: Insufficient documentation

## 2015-06-13 LAB — I-STAT TROPONIN, ED: Troponin i, poc: 0.04 ng/mL (ref 0.00–0.08)

## 2015-06-13 LAB — BASIC METABOLIC PANEL
Anion gap: 11 (ref 5–15)
BUN: 8 mg/dL (ref 6–20)
CO2: 29 mmol/L (ref 22–32)
Calcium: 9.3 mg/dL (ref 8.9–10.3)
Chloride: 103 mmol/L (ref 101–111)
Creatinine, Ser: 1 mg/dL (ref 0.61–1.24)
GFR calc Af Amer: >60 mL/min (ref >60)
GFR calc non Af Amer: >60 mL/min (ref >60)
Glucose, Bld: 103 mg/dL — ABNORMAL HIGH (ref 65–99)
Potassium: 3.8 mmol/L (ref 3.5–5.1)
Sodium: 143 mmol/L (ref 135–145)

## 2015-06-13 LAB — CBC
HCT: 46.9 % (ref 39.0–52.0)
Hemoglobin: 15.9 g/dL (ref 13.0–17.0)
MCH: 30.8 pg (ref 26.0–34.0)
MCHC: 33.9 g/dL (ref 30.0–36.0)
MCV: 90.7 fL (ref 78.0–100.0)
Platelets: 197 10*3/uL (ref 150–400)
RBC: 5.17 MIL/uL (ref 4.22–5.81)
RDW: 13.8 % (ref 11.5–15.5)
WBC: 7 10*3/uL (ref 4.0–10.5)

## 2015-06-13 MED ORDER — PREDNISONE 20 MG PO TABS
60.0000 mg | ORAL_TABLET | Freq: Once | ORAL | Status: AC
  Administered 2015-06-13: 60 mg via ORAL
  Filled 2015-06-13: qty 3

## 2015-06-13 MED ORDER — IPRATROPIUM-ALBUTEROL 0.5-2.5 (3) MG/3ML IN SOLN
3.0000 mL | Freq: Once | RESPIRATORY_TRACT | Status: AC
  Administered 2015-06-13: 3 mL via RESPIRATORY_TRACT
  Filled 2015-06-13: qty 3

## 2015-06-13 MED ORDER — PREDNISONE 20 MG PO TABS: ORAL_TABLET | ORAL | Status: DC

## 2015-06-13 MED ORDER — ALBUTEROL SULFATE HFA 108 (90 BASE) MCG/ACT IN AERS
2.0000 | INHALATION_SPRAY | RESPIRATORY_TRACT | Status: DC
  Administered 2015-06-13: 2 via RESPIRATORY_TRACT
  Filled 2015-06-13: qty 6.7

## 2015-06-13 NOTE — ED Notes (Signed)
Pt here fr 2 weeks of productive cough. sts that chest pain ans SOB started last night. sts that he also had a nosebleed and right sided HA

## 2015-06-13 NOTE — Discharge Instructions (Signed)
Chronic Obstructive Pulmonary Disease °Chronic obstructive pulmonary disease (COPD) is a common lung condition in which airflow from the lungs is limited. COPD is a general term that can be used to describe many different lung problems that limit airflow, including both chronic bronchitis and emphysema. If you have COPD, your lung function will probably never return to normal, but there are measures you can take to improve lung function and make yourself feel better. °CAUSES  °· Smoking (common). °· Exposure to secondhand smoke. °· Genetic problems. °· Chronic inflammatory lung diseases or recurrent infections. °SYMPTOMS °· Shortness of breath, especially with physical activity. °· Deep, persistent (chronic) cough with a large amount of thick mucus. °· Wheezing. °· Rapid breaths (tachypnea). °· Gray or bluish discoloration (cyanosis) of the skin, especially in your fingers, toes, or lips. °· Fatigue. °· Weight loss. °· Frequent infections or episodes when breathing symptoms become much worse (exacerbations). °· Chest tightness. °DIAGNOSIS °Your health care provider will take a medical history and perform a physical examination to diagnose COPD. Additional tests for COPD may include: °· Lung (pulmonary) function tests. °· Chest X-ray. °· CT scan. °· Blood tests. °TREATMENT  °Treatment for COPD may include: °· Inhaler and nebulizer medicines. These help manage the symptoms of COPD and make your breathing more comfortable. °· Supplemental oxygen. Supplemental oxygen is only helpful if you have a low oxygen level in your blood. °· Exercise and physical activity. These are beneficial for nearly all people with COPD. °· Lung surgery or transplant. °· Nutrition therapy to gain weight, if you are underweight. °· Pulmonary rehabilitation. This may involve working with a team of health care providers and specialists, such as respiratory, occupational, and physical therapists. °HOME CARE INSTRUCTIONS °· Take all medicines  (inhaled or pills) as directed by your health care provider. °· Avoid over-the-counter medicines or cough syrups that dry up your airway (such as antihistamines) and slow down the elimination of secretions unless instructed otherwise by your health care provider. °· If you are a smoker, the most important thing that you can do is stop smoking. Continuing to smoke will cause further lung damage and breathing trouble. Ask your health care provider for help with quitting smoking. He or she can direct you to community resources or hospitals that provide support. °· Avoid exposure to irritants such as smoke, chemicals, and fumes that aggravate your breathing. °· Use oxygen therapy and pulmonary rehabilitation if directed by your health care provider. If you require home oxygen therapy, ask your health care provider whether you should purchase a pulse oximeter to measure your oxygen level at home. °· Avoid contact with individuals who have a contagious illness. °· Avoid extreme temperature and humidity changes. °· Eat healthy foods. Eating smaller, more frequent meals and resting before meals may help you maintain your strength. °· Stay active, but balance activity with periods of rest. Exercise and physical activity will help you maintain your ability to do things you want to do. °· Preventing infection and hospitalization is very important when you have COPD. Make sure to receive all the vaccines your health care provider recommends, especially the pneumococcal and influenza vaccines. Ask your health care provider whether you need a pneumonia vaccine. °· Learn and use relaxation techniques to manage stress. °· Learn and use controlled breathing techniques as directed by your health care provider. Controlled breathing techniques include: °· Pursed lip breathing. Start by breathing in (inhaling) through your nose for 1 second. Then, purse your lips as if you were   going to whistle and breathe out (exhale) through the  pursed lips for 2 seconds.  Diaphragmatic breathing. Start by putting one hand on your abdomen just above your waist. Inhale slowly through your nose. The hand on your abdomen should move out. Then purse your lips and exhale slowly. You should be able to feel the hand on your abdomen moving in as you exhale.  Learn and use controlled coughing to clear mucus from your lungs. Controlled coughing is a series of short, progressive coughs. The steps of controlled coughing are:  Lean your head slightly forward.  Breathe in deeply using diaphragmatic breathing.  Try to hold your breath for 3 seconds.  Keep your mouth slightly open while coughing twice.  Spit any mucus out into a tissue.  Rest and repeat the steps once or twice as needed. SEEK MEDICAL CARE IF:  You are coughing up more mucus than usual.  There is a change in the color or thickness of your mucus.  Your breathing is more labored than usual.  Your breathing is faster than usual. SEEK IMMEDIATE MEDICAL CARE IF:  You have shortness of breath while you are resting.  You have shortness of breath that prevents you from:  Being able to talk.  Performing your usual physical activities.  You have chest pain lasting longer than 5 minutes.  Your skin color is more cyanotic than usual.  You measure low oxygen saturations for longer than 5 minutes with a pulse oximeter. MAKE SURE YOU:  Understand these instructions.  Will watch your condition.  Will get help right away if you are not doing well or get worse.   This information is not intended to replace advice given to you by your health care provider. Make sure you discuss any questions you have with your health care provider.   Document Released: 03/07/2005 Document Revised: 06/18/2014 Document Reviewed: 01/22/2013 Elsevier Interactive Patient Education 2016 [employer]. Smoking Hazards Smoking cigarettes is extremely bad for your health. Tobacco smoke has over 200  known poisons in it. It contains the poisonous gases nitrogen oxide and carbon monoxide. There are over 60 chemicals in tobacco smoke that cause cancer. Some of the chemicals found in cigarette smoke include:   Cyanide.   Benzene.   Formaldehyde.   Methanol (wood alcohol).   Acetylene (fuel used in welding torches).   Ammonia.  Even smoking lightly shortens your life expectancy by several years. You can greatly reduce the risk of medical problems for you and your family by stopping now. Smoking is the most preventable cause of death and disease in our society. Within days of quitting smoking, your circulation improves, you decrease the risk of having a heart attack, and your lung capacity improves. There may be some increased phlegm in the first few days after quitting, and it may take months for your lungs to clear up completely. Quitting for 10 years reduces your risk of developing lung cancer to almost that of a nonsmoker.  WHAT ARE THE RISKS OF SMOKING? Cigarette smokers have an increased risk of many serious medical problems, including:  Lung cancer.   Lung disease (such as pneumonia, bronchitis, and emphysema).   Heart attack and chest pain due to the heart not getting enough oxygen (angina).   Heart disease and peripheral blood vessel disease.   Hypertension.   Stroke.   Oral cancer (cancer of the lip, mouth, or voice box).   Bladder cancer.   Pancreatic cancer.   Cervical cancer.   Pregnancy  complications, including premature birth.   Stillbirths and smaller newborn babies, birth defects, and genetic damage to sperm.   Early menopause.   Lower estrogen level for women.   Infertility.   Facial wrinkles.   Blindness.   Increased risk of broken bones (fractures).   Senile dementia.   Stomach ulcers and internal bleeding.   Delayed wound healing and increased risk of complications during surgery. Because of secondhand smoke  exposure, children of smokers have an increased risk of the following:   Sudden infant death syndrome (SIDS).   Respiratory infections.   Lung cancer.   Heart disease.   Ear infections.  WHY IS SMOKING ADDICTIVE? Nicotine is the chemical agent in tobacco that is capable of causing addiction or dependence. When you smoke and inhale, nicotine is absorbed rapidly into the bloodstream through your lungs. Both inhaled and noninhaled nicotine may be addictive.  WHAT ARE THE BENEFITS OF QUITTING?  There are many health benefits to quitting smoking. Some are:   The likelihood of developing cancer and heart disease decreases. Health improvements are seen almost immediately.   Blood pressure, pulse rate, and breathing patterns start returning to normal soon after quitting.   People who quit may see an improvement in their overall quality of life.  HOW DO YOU QUIT SMOKING? Smoking is an addiction with both physical and psychological effects, and longtime habits can be hard to change. Your health care provider can recommend:  Programs and community resources, which may include group support, education, or therapy.  Replacement products, such as patches, gum, and nasal sprays. Use these products only as directed. Do not replace cigarette smoking with electronic cigarettes (commonly called e-cigarettes). The safety of e-cigarettes is unknown, and some may contain harmful chemicals. FOR MORE INFORMATION  American Lung Association: www.lung.org  American Cancer Society: www.cancer.org   This information is not intended to replace advice given to you by your health care provider. Make sure you discuss any questions you have with your health care provider.   Document Released: 07/05/2004 Document Revised: 03/18/2013 Document Reviewed: 11/17/2012 Elsevier Interactive Patient Education 2016 [employer].  Emergency Department Resource Guide 1) Find a Doctor and Pay Out of Pocket Although  you won't have to find out who is covered by your insurance plan, it is a good idea to ask around and get recommendations. You will then need to call the office and see if the doctor you have chosen will accept you as a new patient and what types of options they offer for patients who are self-pay. Some doctors offer discounts or will set up payment plans for their patients who do not have insurance, but you will need to ask so you aren't surprised when you get to your appointment.  2) Contact Your Local Health Department Not all health departments have doctors that can see patients for sick visits, but many do, so it is worth a call to see if yours does. If you don't know where your local health department is, you can check in your phone book. The CDC also has a tool to help you locate your state's health department, and many state websites also have listings of all of their local health departments.  3) Find a Walk-in Clinic If your illness is not likely to be very severe or complicated, you may want to try a walk in clinic. These are popping up all over the country in pharmacies, drugstores, and shopping centers. They're usually staffed by nurse practitioners or physician assistants  that have been trained to treat common illnesses and complaints. They're usually fairly quick and inexpensive. However, if you have serious medical issues or chronic medical problems, these are probably not your best option.  No Primary Care Doctor: - Call Health Connect at  [phone] - they can help you locate a primary care doctor that  accepts your insurance, provides certain services, etc. - Physician Referral Service- [phone]  Chronic Pain Problems: Organization         Address  Phone   Notes  [facility]  [phone] Patients need to be referred by their primary care doctor.   Medication Assistance: Organization         Address  Phone   Notes  [facility] Medication [facility] [address] [place], [place] [address] [phone] --Must be a resident of [facility] -- Must have NO insurance coverage whatsoever (no Medicaid/ Medicare, etc.) -- The pt. MUST have a primary care doctor that directs their care regularly and follows them in the community   [facility]  [phone]   [employer]  [phone]    Agencies that provide inexpensive medical care: Organization         Address  Phone   Notes  [facility]  [phone]   [facility]    [phone]   [facility] [address] [place], [place] [address] [phone]   [facility] [address] [place] [place] [phone]   Planned Parenthood    [phone]   [facility]    [phone]   [facility]  [address], [place] Phone:  [phone], Fax:  [phone] Hours of Operation:  9 am - 6 pm, M-F.  Also accepts Medicaid/Medicare and self-pay.  [facility]  [address], [place] Phone: [phone], Fax: [phone]. Hours of Operation:  8:30 am - 5:30 pm, M-F.  Also accepts Medicaid and self-pay.  [facility] [place] [address], [place] Phone: [phone]   [facility] [address] Natasha Bence [place], [place] [phone], Ext. [phone] Mondays & Thursdays: 7-9 AM.  First 15 patients are seen on a first come, first serve basis.    Medicaid-accepting [facility] Providers:  Organization         Address  Phone   Notes  [facility] [address], [place] [phone] Also accepts self-pay patients.  [facility] [address] Laurell Josephs [place], [place]  [phone]   [facility] [address], [place] [phone]   [facility] [address], [place] [phone]   Renaye Rakers [address], [place]   [phone] Only accepts [place] patients after they have their name applied to their card.   Self-Pay (no insurance) in [facility]:  Organization         Address  Phone   Notes  Sickle Cell Patients, [facility] Internal Medicine [address] [place], [place] [phone]   [facility] [address] [place], [place] [phone]   [facility] [place]  [address],  [phone]   [facility]/Dr. Osei-Bonsu  [address] [place] [address],  [facility] [place] or [address], [place] [phone] Phone number for both [place] and [place] locations is the same.  [facility] [address], [place] [phone]) [phone]   [facility] [place] [address] [place] [address], [place] or [address] [phone]) [phone]   [facility] [address], [place] [phone]) [phone], phone; [phone]) [phone], fax Sees patients 1st and 3rd Saturday of every month.  Must not qualify for public or private insurance (i.e. Medicaid, Medicare, [place] Health Choice, Veterans' Benefits)  Household income should be no more than 200% of the poverty level The clinic cannot treat you if you are pregnant or think you are pregnant  Sexually transmitted diseases are not treated at the clinic.    Dental Care: Organization         Address  Phone  Notes  [facility] [address], [place] [phone] Accepts children up to age 62 who are enrolled in [place] or [place] Health Choice; pregnant women with a Medicaid card; and children who have applied for Medicaid or [place] Health Choice, but were declined, whose parents can pay a reduced fee at time of service.  [facility] [place]  [address], [place] [phone] Accepts  children up to age 62 who are enrolled in [place] or [place] Health Choice; pregnant women with a Medicaid card; and children who have applied for Medicaid or [place] Health Choice, but were declined, whose parents can pay a reduced fee at time of service.  [facility]  [address], [place] [phone]) [phone] Patients are seen by appointment only. Walk-ins are not accepted. [facility] will see patients 62 years of age and older. Monday - Tuesday (8am-5pm) Most Wednesdays (8:30-5pm) $30 per visit, cash only  [facility]  [address], [facility] [place] [phone] Patients are seen by appointment only. Walk-ins are not accepted. [facility] will see patients 62 years of age and older. One Wednesday Evening (Monthly: Volunteer Based).  $30 per visit, cash only  [employer] [facility] [facility]  [phone] for adults; Children under age 934, call Graduate Pediatric Dentistry at [phone]) [phone]. Children aged 154-14, please call [phone] to request a pediatric application.  Dental services are provided in all areas of dental care including fillings, crowns and bridges, complete and partial dentures, implants, gum treatment, root canals, and extractions. Preventive care is also provided. Treatment is provided to both adults and children. Patients are selected via a lottery and there is often a waiting list.   [facility] [address], [place]  [phone]) [phone] [email or web address]   [facility] [address] [place] [address], [place], [place] [phone], Ext. [phone] Second and Fourth Thursday of each month, opens at 6:30 AM; Clinic ends at 9 AM.  Patients are seen on a first-come first-served basis, and a limited number are seen during each clinic.   [facility]  [address], [place], [place] [phone]) [phone]   Eligibility Requirements You must have lived in [place], [place], or [place] for at least the  last three months.   You cannot be eligible for state or federal sponsored [place]healthcare insurance, including CIGNAVeterans Administration, [place]icaid, or [employer]icare.   You generally cannot be eligible for healthcare insurance through your employer.    How to apply:  Eligibility screenings are held every Tuesday and Wednesday afternoon from 1:00 pm until 4:00 pm. You do not need an appointment for the interview!  [facility] [address] [place], [place] [phone]   [facility] Health Department  [phone]   [facility] Health Department  [phone]   [facility] Health Department  [phone]    Behavioral Health Resources in the Community: Intensive Outpatient Programs Organization         Address  Phone  Notes  [facility] Services [address], [place], [place] [phone]   [facility] Outpatient [address] [place], [place] [phone]   [facility] [address], [place], [place]  [phone]   [facility] [name],  [place], [place] [phone] or [phone]   Substance Abuse Resources Organization         Address  Phone  Notes  [facility]  [phone]   [facility]  [phone]   [facility]  [phone]   Floydene Flock  [phone]   Residential & Outpatient Substance Abuse Program  [phone]   Psychological Services Organization         Address  Phone  Notes  [facility] Behavioral Health  [phone]   [facility] Services  [phone]   [facility] [address], [place] [phone] or [phone]    Mobile Crisis Teams Organization         Address  Phone  Notes  [facility], Mobile Crisis Care Unit  [phone]   [facility]  [address] [place], [place] [phone]   Doristine Locks [address] [place] [phone]     Self-Help/Support Groups Organization         Address  Phone             Notes  [facility] [place] - variety of support groups  336- I7437963 Call for more information  Narcotics Anonymous (NA), [facility] [address], [employer] [place]  2 meetings at this location   Statistician         Address  Phone  Notes  [facility] [address],    [place]  [phone]   [facility]  [address], [place] [address], [place], [place] [phone]   [facility] [address] [place], [place] [phone] Admissions: 8am-3pm M-F  [facility] [address]    [place], [place] [phone]   [facility] [address] [place], [place], [place] [phone]   [facility] [address],  [place], [place] [phone]   [facility] - Intensive Outpatient [address] Laurell Josephs [address], [place], [place] [phone]   [facility] ([facility] [address]  [place], [place] [phone] or [phone]   [facility] ([facility]) [address], [place], [place] [phone] Accepts Medicaid  [facility] [address],  [place] [phone] Substance Abuse/Addiction Treatment   [facility] Organization         Address  Phone  Notes  [facility]  [phone]   Angie Fava, PhD [address] Ervin Knack [place], [place]   [phone] or [phone]   [facility] Behavioral   [address] [place], [place] [phone]   [facility] [address], [place], [place] [phone] Insurance/Medicaid/sponsorship through [facility] and Families [address]                                    [place], [place] [phone] Therapy/tele-psych/case  [facility] [address]   [place], [place] [phone]    Dr. Lolly Mustache  [phone]   Free  [facility] [place]  [employer] [facility] Dept. 1) [address], [place] 2) [address] [place] 3)  [address], [place] [phone]  [phone]   [facility] Child Abuse Hotline 470-829-8294 or [phone] (After Hours)

## 2015-06-13 NOTE — ED Provider Notes (Signed)
CSN: [id number]     Arrival date & time 06/13/15  7829 History   First MD Initiated Contact with Patient 06/13/15 [phone]     Chief Complaint  Patient presents with  . Cough  . Chest Pain  . Shortness of Breath     (Consider location/radiation/quality/duration/timing/severity/associated sxs/prior Treatment) HPI Cough for 2 weeks. Productive of yellow sputum. Chest pain after coughing. Increasing shortness of breath. Patient reports he continues to smoke. He reports history of COPD. No treatment for this episode. Patient ran out of inhalers over year ago and has not been able to refill any. No lower extremity swelling or pain. Patient reports compliance with his blood pressure medications. Patient reports he is concerned because last night after coughing episode he got a nosebleed and a slight headache on the right side of his head. He reports he is also had nasal congestion with drainage and discharge. Also reports 2 episodes of posttussive emesis yesterday. Past Medical History  Diagnosis Date  . Hypertension   . Stroke (HCC)   . Shortness of breath   . Headache(784.0)   . Systolic heart failure (HCC)   . CAD (coronary artery disease)   . Tobacco abuse    Past Surgical History  Procedure Laterality Date  . Cardiac catheterization  10/2010    LM normal, LAD with 20% irregularities, LCX with 20%, RCA with 40% prox and 40% distal - EF of 35%   Family History  Problem Relation Age of Onset  . Cancer Mother   . Heart disease Mother   . Diabetes Mother   . Cancer Father   . Diabetes Father   . Diabetes Sister   . Diabetes Brother    Social History  Substance Use Topics  . Smoking status: Current Every Day Smoker -- 2.00 packs/day for 40 years    Types: Cigarettes  . Smokeless tobacco: None     Comment: Smoking 1ppd  . Alcohol Use: 0.0 oz/week    0 Standard drinks or equivalent per week     Comment: occasionally    Review of Systems  10 Systems reviewed and are negative for  acute change except as noted in the HPI.   Allergies  Lisinopril  Home Medications   Prior to Admission medications   Medication Sig Start Date End Date Taking? Authorizing Provider  atorvastatin (LIPITOR) 20 MG tablet Take 0.5 tablets (10 mg total) by mouth daily. 05/13/15  Yes Pricilla Riffle, MD  losartan (COZAAR) 50 MG tablet Take 1 tablet (50 mg total) by mouth daily. 05/13/15  Yes Pricilla Riffle, MD  aspirin EC 81 MG EC tablet Take 1 tablet (81 mg total) by mouth daily. Patient not taking: Reported on 06/13/2015 10/08/13   Marinda Elk, MD  metoprolol tartrate (LOPRESSOR) 25 MG tablet Take 1 tablet (25 mg total) by mouth 2 (two) times daily. Patient not taking: Reported on 06/13/2015 06/01/14   Pricilla Riffle, MD  predniSONE (DELTASONE) 20 MG tablet 3 tabs po day one, then 2 po daily x 4 days 06/13/15   Arby Barrette, MD   BP 179/108 mmHg  Pulse 90  Temp(Src) 98.8 F (37.1 C)  Resp 21  Wt 178 lb 4 oz (80.854 kg)  SpO2 96% Physical Exam  Constitutional: He is oriented to person, place, and time. He appears well-developed and well-nourished.  HENT:  Head: Normocephalic and atraumatic.  Nose: Nose normal.  Mouth/Throat: Oropharynx is clear and moist.  Eyes: EOM are normal. Pupils are equal,  round, and reactive to light.  Neck: Neck supple.  Cardiovascular: Normal rate, regular rhythm, normal heart sounds and intact distal pulses.   Pulmonary/Chest: Effort normal.  Soft breath sounds with poor air flow to basis consistent with COPD. No respiratory distress.  Abdominal: Soft. Bowel sounds are normal. He exhibits no distension. There is no tenderness.  Musculoskeletal: Normal range of motion. He exhibits no edema.  Neurological: He is alert and oriented to person, place, and time. He has normal strength. Coordination normal. GCS eye subscore is 4. GCS verbal subscore is 5. GCS motor subscore is 6.  Skin: Skin is warm, dry and intact.  Psychiatric: He has a normal mood and affect.     ED Course  Procedures (including critical care time) Labs Review Labs Reviewed  BASIC METABOLIC PANEL - Abnormal; Notable for the following:    Glucose, Bld 103 (*)    All other components within normal limits  CBC  I-STAT TROPOININ, ED    Imaging Review Dg Chest 2 View  06/13/2015  CLINICAL DATA:  60 year old male with acute cough and shortness of breath. EXAM: CHEST  2 VIEW COMPARISON:  10/03/2013 FINDINGS: The cardiomediastinal silhouette is unremarkable. COPD/emphysema changes identified. There is no evidence of focal airspace disease, pulmonary edema, suspicious pulmonary nodule/mass, pleural effusion, or pneumothorax. No acute bony abnormalities are identified. Remote rib fractures are identified. IMPRESSION: COPD/emphysema without evidence of acute cardiopulmonary disease. Electronically Signed   By: Harmon Pier M.D.   On: 06/13/2015 09:15   I have personally reviewed and evaluated these images and lab results as part of my medical decision-making.   EKG Interpretation   Date/Time:  Monday June 13 2015 08:44:31 EST Ventricular Rate:  110 PR Interval:  132 QRS Duration: 80 QT Interval:  342 QTC Calculation: 462 R Axis:   -102 Text Interpretation:  Sinus tachycardia Right atrial enlargement Right  superior axis deviation Pulmonary disease pattern Nonspecific ST  abnormality Abnormal QRS-T angle, consider primary T wave abnormality  Abnormal ECG non specific ST changes. C/W old. Confirmed by Donnald Garre, MD,  Lebron Conners [id number]) on 06/13/2015 10:56:36 AM      MDM   Final diagnoses:  COPD exacerbation [facility])   Patient is alert and nontoxic. He does not have acute respiratory distress. History symptoms are consistent with COPD exacerbation. Patient does not have fever or leukocytosis to suggest acute pneumonia. At this time he will be treated with prednisone and albuterol with instructions for close follow-up. Signs and symptoms were to return are reviewed.    Arby Barrette, MD 06/13/15 1124

## 2015-07-21 ENCOUNTER — Ambulatory Visit: Payer: Commercial Managed Care - HMO | Admitting: Internal Medicine

## 2015-08-18 ENCOUNTER — Emergency Department (HOSPITAL_COMMUNITY): Payer: Commercial Managed Care - HMO

## 2015-08-18 ENCOUNTER — Emergency Department (HOSPITAL_COMMUNITY)
Admission: EM | Admit: 2015-08-18 | Discharge: 2015-08-18 | Disposition: A | Discharge status: Home / Self Care | Payer: Commercial Managed Care - HMO | Attending: Emergency Medicine | Admitting: Emergency Medicine

## 2015-08-18 ENCOUNTER — Encounter (HOSPITAL_COMMUNITY): Payer: Self-pay | Admitting: Emergency Medicine

## 2015-08-18 DIAGNOSIS — I10 Essential (primary) hypertension: Secondary | ICD-10-CM | POA: Diagnosis not present

## 2015-08-18 DIAGNOSIS — R519 Headache, unspecified: Secondary | ICD-10-CM

## 2015-08-18 DIAGNOSIS — R51 Headache: Secondary | ICD-10-CM | POA: Diagnosis present

## 2015-08-18 DIAGNOSIS — Z79899 Other long term (current) drug therapy: Secondary | ICD-10-CM | POA: Diagnosis not present

## 2015-08-18 DIAGNOSIS — Z7982 Long term (current) use of aspirin: Secondary | ICD-10-CM | POA: Insufficient documentation

## 2015-08-18 DIAGNOSIS — Z9889 Other specified postprocedural states: Secondary | ICD-10-CM | POA: Insufficient documentation

## 2015-08-18 DIAGNOSIS — Z8673 Personal history of transient ischemic attack (TIA), and cerebral infarction without residual deficits: Secondary | ICD-10-CM | POA: Diagnosis not present

## 2015-08-18 DIAGNOSIS — R079 Chest pain, unspecified: Secondary | ICD-10-CM | POA: Insufficient documentation

## 2015-08-18 DIAGNOSIS — I5020 Unspecified systolic (congestive) heart failure: Secondary | ICD-10-CM | POA: Diagnosis not present

## 2015-08-18 DIAGNOSIS — F17210 Nicotine dependence, cigarettes, uncomplicated: Secondary | ICD-10-CM | POA: Diagnosis not present

## 2015-08-18 DIAGNOSIS — I2510 Atherosclerotic heart disease of native coronary artery without angina pectoris: Secondary | ICD-10-CM | POA: Insufficient documentation

## 2015-08-18 DIAGNOSIS — R040 Epistaxis: Secondary | ICD-10-CM | POA: Diagnosis not present

## 2015-08-18 LAB — CBC WITH DIFFERENTIAL/PLATELET
Basophils Absolute: 0 10*3/uL (ref 0.0–0.1)
Basophils Relative: 0 %
Eosinophils Absolute: 0 10*3/uL (ref 0.0–0.7)
Eosinophils Relative: 0 %
HCT: 46.2 % (ref 39.0–52.0)
Hemoglobin: 15.1 g/dL (ref 13.0–17.0)
Lymphocytes Relative: 40 %
Lymphs Abs: 2.1 10*3/uL (ref 0.7–4.0)
MCH: 30 pg (ref 26.0–34.0)
MCHC: 32.7 g/dL (ref 30.0–36.0)
MCV: 91.7 fL (ref 78.0–100.0)
Monocytes Absolute: 0.8 10*3/uL (ref 0.1–1.0)
Monocytes Relative: 16 %
Neutro Abs: 2.3 10*3/uL (ref 1.7–7.7)
Neutrophils Relative %: 44 %
Platelets: 161 10*3/uL (ref 150–400)
RBC: 5.04 MIL/uL (ref 4.22–5.81)
RDW: 14.2 % (ref 11.5–15.5)
WBC: 5.2 10*3/uL (ref 4.0–10.5)

## 2015-08-18 LAB — BASIC METABOLIC PANEL
Anion gap: 12 (ref 5–15)
BUN: 14 mg/dL (ref 6–20)
CO2: 23 mmol/L (ref 22–32)
Calcium: 8.8 mg/dL — ABNORMAL LOW (ref 8.9–10.3)
Chloride: 101 mmol/L (ref 101–111)
Creatinine, Ser: 1.19 mg/dL (ref 0.61–1.24)
GFR calc Af Amer: >60 mL/min (ref >60)
GFR calc non Af Amer: >60 mL/min (ref >60)
Glucose, Bld: 83 mg/dL (ref 65–99)
Potassium: 3.9 mmol/L (ref 3.5–5.1)
Sodium: 136 mmol/L (ref 135–145)

## 2015-08-18 LAB — I-STAT TROPONIN, ED: Troponin i, poc: 0.06 ng/mL (ref 0.00–0.08)

## 2015-08-18 MED ORDER — METOPROLOL TARTRATE 25 MG PO TABS
25.0000 mg | ORAL_TABLET | Freq: Once | ORAL | Status: AC
  Administered 2015-08-18: 25 mg via ORAL
  Filled 2015-08-18: qty 1

## 2015-08-18 MED ORDER — METOPROLOL TARTRATE 25 MG PO TABS: 25.0000 mg | ORAL_TABLET | Freq: Two times a day (BID) | ORAL | Status: DC

## 2015-08-18 MED ORDER — SODIUM CHLORIDE 0.9 % IV BOLUS (SEPSIS)
1000.0000 mL | Freq: Once | INTRAVENOUS | Status: AC
  Administered 2015-08-18: 1000 mL via INTRAVENOUS

## 2015-08-18 MED ORDER — DIPHENHYDRAMINE HCL 50 MG/ML IJ SOLN
25.0000 mg | Freq: Once | INTRAMUSCULAR | Status: AC
  Administered 2015-08-18: 25 mg via INTRAVENOUS
  Filled 2015-08-18: qty 1

## 2015-08-18 MED ORDER — METOCLOPRAMIDE HCL 5 MG/ML IJ SOLN
10.0000 mg | Freq: Once | INTRAMUSCULAR | Status: AC
  Administered 2015-08-18: 10 mg via INTRAVENOUS
  Filled 2015-08-18: qty 2

## 2015-08-18 MED ORDER — KETOROLAC TROMETHAMINE 30 MG/ML IJ SOLN
30.0000 mg | Freq: Once | INTRAMUSCULAR | Status: AC
  Administered 2015-08-18: 30 mg via INTRAVENOUS
  Filled 2015-08-18: qty 1

## 2015-08-18 MED ORDER — LOSARTAN POTASSIUM 50 MG PO TABS: 50.0000 mg | ORAL_TABLET | Freq: Every day | ORAL | Status: DC

## 2015-08-18 NOTE — ED Notes (Signed)
Pt. reports headache onset Monday and intermittent right nare epistaxis for 2 weeks , no bleeding at arrival .

## 2015-08-18 NOTE — ED Notes (Addendum)
Pt ambulatory to room c/o of h/a and top of head tingling x 2 weeks on and off nose bleed rt side also no appetite and a cough. Also states that having cp x 1 month has heart hx is suppose to on bp meds but cannot afford he states

## 2015-08-18 NOTE — ED Provider Notes (Signed)
CSN: [id number]     Arrival date & time 08/18/15  0532 History   First MD Initiated Contact with Patient 08/18/15 [phone]     Chief Complaint  Patient presents with  . Headache  . Epistaxis     (Consider location/radiation/quality/duration/timing/severity/associated sxs/prior Treatment) HPI  Pt presents with c/o headache which is similar to his prior headaches.  STates the pain is in the top of his head and throbbing.  Headache began 2 days ago - he has tried goody's powders and aspirin and ibuprofen with mild amount of relief.  No changes in vision or speech.  No weakness of extremities. He states he has had a decreased appetite due to the headache.  He also states he has been having constant chest pain in the center of his chest that is unchanged.  No associated shortness of breath, no diaphoresis or nausea.  No recent travel, trauma, surgery.  No leg swelling.  No hx of DVT/PE.  States he has been off his BP meds for the past month as he cannot afford them.  He also c/o intermittent nose bleeds from right sided of nose- no bleeding now- no head trauam.  No other areas of easy bruising or bleeding.  There are no other associated systemic symptoms, there are no other alleviating or modifying factors.   Past Medical History  Diagnosis Date  . Hypertension   . Stroke (HCC)   . Shortness of breath   . Headache(784.0)   . Systolic heart failure (HCC)   . CAD (coronary artery disease)   . Tobacco abuse    Past Surgical History  Procedure Laterality Date  . Cardiac catheterization  10/2010    LM normal, LAD with 20% irregularities, LCX with 20%, RCA with 40% prox and 40% distal - EF of 35%   Family History  Problem Relation Age of Onset  . Cancer Mother   . Heart disease Mother   . Diabetes Mother   . Cancer Father   . Diabetes Father   . Diabetes Sister   . Diabetes Brother    Social History  Substance Use Topics  . Smoking status: Current Every Day Smoker -- 2.00 packs/day for 40  years    Types: Cigarettes  . Smokeless tobacco: None     Comment: Smoking 1ppd  . Alcohol Use: 0.0 oz/week    0 Standard drinks or equivalent per week     Comment: occasionally    Review of Systems  ROS reviewed and all otherwise negative except for mentioned in HPI    Allergies  Lisinopril  Home Medications   Prior to Admission medications   Medication Sig Start Date End Date Taking? Authorizing Provider  aspirin EC 81 MG EC tablet Take 1 tablet (81 mg total) by mouth daily. 10/08/13  Yes Marinda Elk, MD  atorvastatin (LIPITOR) 20 MG tablet Take 0.5 tablets (10 mg total) by mouth daily. 05/13/15  Yes Pricilla Riffle, MD  predniSONE (DELTASONE) 20 MG tablet 3 tabs po day one, then 2 po daily x 4 days 06/13/15  Yes Arby Barrette, MD  losartan (COZAAR) 50 MG tablet Take 1 tablet (50 mg total) by mouth daily. 08/18/15   Jerelyn Scott, MD  metoprolol tartrate (LOPRESSOR) 25 MG tablet Take 1 tablet (25 mg total) by mouth 2 (two) times daily. 08/18/15   Jerelyn Scott, MD   BP 148/98 mmHg  Pulse 78  Temp(Src) 98.9 F (37.2 C) (Oral)  Resp 16  Ht  (1.905  m)  Wt 79.379 kg  BMI 21.87 kg/m2  SpO2 99%  Vitals reviewed Physical Exam  Physical Examination: General appearance - alert, well appearing, and in no distress Mental status - alert, oriented to person, place, and time Eyes - pupils equal and reactive, extraocular eye movements intact Mouth - mucous membranes moist, pharynx normal without lesions  Nose- no septal hematoma, no epistaxis Neck - supple, no significant adenopathy Chest - clear to auscultation, no wheezes, rales or rhonchi, symmetric air entry Heart - normal rate, regular rhythm, normal S1, S2, no murmurs, rubs, clicks or gallops Abdomen - soft, nontender, nondistended, no masses or organomegaly Neurological - alert, oriented, normal speech, cranial nerves 2-12 tested and intact, strength 5/5 in extremities x 4, sensation intact Extremities - peripheral pulses  normal, no pedal edema, no clubbing or cyanosis Skin - normal coloration and turgor, no rashes  ED Course  Procedures (including critical care time) Labs Review Labs Reviewed  BASIC METABOLIC PANEL - Abnormal; Notable for the following:    Calcium 8.8 (*)    All other components within normal limits  CBC WITH DIFFERENTIAL/PLATELET  Rosezena Sensor, ED    Imaging Review Dg Chest 2 View  08/18/2015  CLINICAL DATA:  Left side chest pain, headache, hypertension EXAM: CHEST  2 VIEW COMPARISON:  06/13/2015 FINDINGS: Cardiomediastinal silhouette is stable. No infiltrate or pulmonary edema. Hyperinflation again noted. Mild degenerative changes lower thoracic spine again noted. IMPRESSION: No active cardiopulmonary disease.  Hyperinflation again noted. Electronically Signed   By: Natasha Mead M.D.   On: 08/18/2015 08:47   I have personally reviewed and evaluated these images and lab results as part of my medical decision-making.   EKG Interpretation   Date/Time:  Thursday August 18 2015 07:43:16 EST Ventricular Rate:  85 PR Interval:  124 QRS Duration: 93 QT Interval:  371 QTC Calculation: 441 R Axis:   -74 Text Interpretation:  Sinus arrhythmia Left anterior fascicular block  Anterior infarct, old No significant change since last tracing Confirmed  by [facility]  MD, MARTHA [phone]) on 08/18/2015 9:22:56 AM      MDM   Final diagnoses:  Nonintractable episodic headache, unspecified headache type  Essential hypertension  Epistaxis    Pt presenting with c/o headache similar to prior headaches, no active epistaxis in the ED.  He also c/o chest pain which has been constant for one month- with negative troponin this makes ACS very unlikley.  No pneumonia or other acute findings on CXR.  BP elevated, pt out of his meds.  States he cannot afford- lopressor is on [employer] - I have advised him of this and will write rx for both.  Advised to f/u with wellness center to see if there are other  adjustments that can be made to help him with affording his medications.  Discharged with strict return precautions.  Pt agreeable with plan.    Jerelyn Scott, MD 08/18/15 1235

## 2015-08-18 NOTE — Discharge Instructions (Signed)
Return to the ED with any concerns including difficulty breathing, changes in vision or speech, weakness of arms or legs, fainting, decreased level of alertness/lethargy, or any other alarming symptoms

## 2016-01-02 ENCOUNTER — Emergency Department (HOSPITAL_COMMUNITY): Payer: Commercial Managed Care - HMO

## 2016-01-02 ENCOUNTER — Encounter (HOSPITAL_COMMUNITY): Payer: Self-pay | Admitting: Emergency Medicine

## 2016-01-02 ENCOUNTER — Emergency Department (HOSPITAL_COMMUNITY)
Admission: EM | Admit: 2016-01-02 | Discharge: 2016-01-02 | Disposition: A | Discharge status: Home / Self Care | Payer: Commercial Managed Care - HMO | Attending: Emergency Medicine | Admitting: Emergency Medicine

## 2016-01-02 DIAGNOSIS — I2510 Atherosclerotic heart disease of native coronary artery without angina pectoris: Secondary | ICD-10-CM | POA: Diagnosis not present

## 2016-01-02 DIAGNOSIS — R0602 Shortness of breath: Secondary | ICD-10-CM | POA: Diagnosis present

## 2016-01-02 DIAGNOSIS — I110 Hypertensive heart disease with heart failure: Secondary | ICD-10-CM | POA: Insufficient documentation

## 2016-01-02 DIAGNOSIS — J449 Chronic obstructive pulmonary disease, unspecified: Secondary | ICD-10-CM | POA: Insufficient documentation

## 2016-01-02 DIAGNOSIS — F17210 Nicotine dependence, cigarettes, uncomplicated: Secondary | ICD-10-CM | POA: Diagnosis not present

## 2016-01-02 DIAGNOSIS — Z8673 Personal history of transient ischemic attack (TIA), and cerebral infarction without residual deficits: Secondary | ICD-10-CM | POA: Insufficient documentation

## 2016-01-02 DIAGNOSIS — I5023 Acute on chronic systolic (congestive) heart failure: Secondary | ICD-10-CM | POA: Diagnosis not present

## 2016-01-02 LAB — CBC
HCT: 48.5 % (ref 39.0–52.0)
Hemoglobin: 15.8 g/dL (ref 13.0–17.0)
MCH: 30 pg (ref 26.0–34.0)
MCHC: 32.6 g/dL (ref 30.0–36.0)
MCV: 92 fL (ref 78.0–100.0)
Platelets: 179 10*3/uL (ref 150–400)
RBC: 5.27 MIL/uL (ref 4.22–5.81)
RDW: 14.7 % (ref 11.5–15.5)
WBC: 7.3 10*3/uL (ref 4.0–10.5)

## 2016-01-02 LAB — BASIC METABOLIC PANEL
Anion gap: 5 (ref 5–15)
BUN: 14 mg/dL (ref 6–20)
CO2: 31 mmol/L (ref 22–32)
Calcium: 8.8 mg/dL — ABNORMAL LOW (ref 8.9–10.3)
Chloride: 106 mmol/L (ref 101–111)
Creatinine, Ser: 0.95 mg/dL (ref 0.61–1.24)
GFR calc Af Amer: >60 mL/min (ref >60)
GFR calc non Af Amer: >60 mL/min (ref >60)
Glucose, Bld: 99 mg/dL (ref 65–99)
Potassium: 3.5 mmol/L (ref 3.5–5.1)
Sodium: 142 mmol/L (ref 135–145)

## 2016-01-02 LAB — BRAIN NATRIURETIC PEPTIDE: B Natriuretic Peptide: 683.6 pg/mL — ABNORMAL HIGH (ref 0.0–100.0)

## 2016-01-02 LAB — I-STAT TROPONIN, ED: Troponin i, poc: 0.06 ng/mL (ref 0.00–0.08)

## 2016-01-02 MED ORDER — FUROSEMIDE 20 MG PO TABS: 20.0000 mg | ORAL_TABLET | Freq: Every day | ORAL | 0 refills | Status: DC

## 2016-01-02 MED ORDER — PREDNISONE 20 MG PO TABS
60.0000 mg | ORAL_TABLET | Freq: Once | ORAL | Status: AC
  Administered 2016-01-02: 60 mg via ORAL
  Filled 2016-01-02: qty 3

## 2016-01-02 MED ORDER — METOPROLOL TARTRATE 25 MG PO TABS: 50.0000 mg | ORAL_TABLET | Freq: Two times a day (BID) | ORAL | 0 refills | Status: DC

## 2016-01-02 MED ORDER — LOSARTAN POTASSIUM 50 MG PO TABS: 50.0000 mg | ORAL_TABLET | Freq: Every day | ORAL | 0 refills | Status: DC

## 2016-01-02 MED ORDER — IPRATROPIUM-ALBUTEROL 0.5-2.5 (3) MG/3ML IN SOLN
3.0000 mL | Freq: Once | RESPIRATORY_TRACT | Status: AC
  Administered 2016-01-02: 3 mL via RESPIRATORY_TRACT
  Filled 2016-01-02: qty 3

## 2016-01-02 MED FILL — LOSARTAN POTASSIUM 50 MG TA: 50 | 15 days supply | Qty: 15 | Fill #0

## 2016-01-02 MED FILL — METOPROLOL TARTRATE 25 MG T: 25 | 7 days supply | Qty: 30 | Fill #0

## 2016-01-02 MED FILL — FUROSEMIDE 20 MG TABLET: 20 | 15 days supply | Qty: 15 | Fill #0

## 2016-01-02 NOTE — ED Triage Notes (Signed)
Patient reports productive cough , chest tightness/congestion and SOB onset last week , history of COPD and heavy cigarette smoker  , denies fever or chills.

## 2016-01-02 NOTE — ED Provider Notes (Signed)
[facility]-EMERGENCY DEPT Provider Note   CSN: [id number] Arrival date & time: 01/02/16  6226  First Provider Contact:  First MD Initiated Contact with Patient 01/02/16 [phone]        History   Chief Complaint Chief Complaint  Patient presents with  . COPD  . Cough    HPI Neji Seitz is a 60 y.o. male.  This is a 60 year old male with a history of COPD, coronary artery disease, hypertension, heart failure who presents with shortness of breath. Patient reports onset of symptoms on Friday. He reports worsening shortness of breath on exertion and orthopnea. Denies any lower extremity swelling. He continues to smoke. Reports nonproductive cough. Denies fevers. Reports "a nagging chest pain." It has been constant. It is currently 1 out of 10.    Cough  Associated symptoms include chest pain and shortness of breath.    Past Medical History:  Diagnosis Date  . CAD (coronary artery disease)   . Headache(784.0)   . Hypertension   . Shortness of breath   . Stroke (HCC)   . Systolic heart failure (HCC)   . Tobacco abuse     Patient Active Problem List   Diagnosis Date Noted  . COPD bronchitis 01/15/2014  . Atrial tachycardia, paroxysmal (HCC) 10/07/2013  . CAD (coronary artery disease), native coronary artery 10/07/2013  . 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 COPD with acute exacerbation (HCC) 10/04/2013  . Acute diastolic congestive heart failure (HCC) 10/04/2013  . Sinus tachycardia (HCC) 10/04/2013  . CAP (community acquired pneumonia) 10/03/2013  . Acute respiratory failure (HCC) 10/03/2013  . Hypertension     Past Surgical History:  Procedure Laterality Date  . CARDIAC CATHETERIZATION  10/2010   LM normal, LAD with 20% irregularities, LCX with 20%, RCA with 40% prox and 40% distal - EF of 35%       Home Medications    Prior to Admission medications   Medication Sig Start Date End Date Taking? Authorizing Provider  furosemide (LASIX) 20 MG tablet  Take 1 tablet (20 mg total) by mouth daily. 01/02/16   Shon Baton, MD  losartan (COZAAR) 50 MG tablet Take 1 tablet (50 mg total) by mouth daily. 01/02/16   Shon Baton, MD  metoprolol (LOPRESSOR) 25 MG tablet Take 2 tablets (50 mg total) by mouth 2 (two) times daily. 01/02/16   Shon Baton, MD    Family History Family History  Problem Relation Age of Onset  . Cancer Mother   . Heart disease Mother   . Diabetes Mother   . Cancer Father   . Diabetes Father   . Diabetes Sister   . Diabetes Brother     Social History Social History  Substance Use Topics  . Smoking status: Current Every Day Smoker    Packs/day: 0.00    Years: 40.00    Types: Cigarettes  . Smokeless tobacco: Not on file     Comment: Smoking 1ppd  . Alcohol use 0.0 oz/week     Comment: occasionally     Allergies   Lisinopril   Review of Systems Review of Systems  Constitutional: Negative for fever.  Respiratory: Positive for cough, chest tightness and shortness of breath.   Cardiovascular: Positive for chest pain. Negative for leg swelling.  Gastrointestinal: Negative for nausea and vomiting.     Physical Exam Updated Vital Signs BP (!) 172/104   Pulse 83   Temp 98.4 F (36.9 C) (Oral)   Resp 19   SpO2  95%   Physical Exam  Constitutional: He is oriented to person, place, and time.  Tall, thin, no acute distress  HENT:  Head: Normocephalic and atraumatic.  Eyes: Pupils are equal, round, and reactive to light.  Cardiovascular: Normal rate, regular rhythm and normal heart sounds.   No murmur heard. Pulmonary/Chest: Effort normal and breath sounds normal. No respiratory distress. He has no wheezes.  Abdominal: Soft. Bowel sounds are normal. There is no tenderness. There is no rebound.  Musculoskeletal: He exhibits no edema.  Lymphadenopathy:    He has no cervical adenopathy.  Neurological: He is alert and oriented to person, place, and time.  Skin: Skin is warm and dry.    Psychiatric: He has a normal mood and affect.  Nursing note and vitals reviewed.    ED Treatments / Results  Labs (all labs ordered are listed, but only abnormal results are displayed) Labs Reviewed  BASIC METABOLIC PANEL - Abnormal; Notable for the following:       Result Value   Calcium 8.8 (*)    All other components within normal limits  BRAIN NATRIURETIC PEPTIDE - Abnormal; Notable for the following:    B Natriuretic Peptide 683.6 (*)    All other components within normal limits  CBC  I-STAT TROPOININ, ED    EKG  EKG Interpretation  Date/Time:  Monday January 02 2016 00:48:44 EDT Ventricular Rate:  90 PR Interval:  122 QRS Duration: 102 QT Interval:  412 QTC Calculation: 504 R Axis:   -51 Text Interpretation:  Normal sinus rhythm Left axis deviation Left ventricular hypertrophy Prolonged QT Abnormal ECG Confirmed by Wilkie Aye  MD, Toni Amend ([id number]) on 01/02/2016 3:30:56 AM       Radiology Dg Chest 2 View  Result Date: 01/02/2016 CLINICAL DATA:  Shortness of breath since Friday. Cough for a month. History of COPD and chest congestion. EXAM: CHEST  2 VIEW COMPARISON:  08/18/2015; 06/13/2015; 10/03/2013 FINDINGS: Grossly unchanged cardiac silhouette and mediastinal contours. Atherosclerotic plaque within the thoracic aorta. The lungs remain hyperexpanded with flattening of the bilateral hemidiaphragms. Interval development of a suspected trace right-sided pleural effusion with small amount of fluid tracking within the right major and minor fissures. Query mild cephalization of flow. No focal airspace opacities. No pneumothorax. No acute osseus abnormalities. IMPRESSION: 1. Suspected mild pulmonary edema superimposed on advanced emphysematous change with trace right-sided pleural effusion. 2.  Emphysema. (ICD10-J43.9) 3.  Aortic Atherosclerosis (ICD10-170.0) Electronically Signed   By: Simonne Come M.D.   On: 01/02/2016 01:38   Procedures Procedures (including critical care  time)  Medications Ordered in ED Medications  ipratropium-albuterol (DUONEB) 0.5-2.5 (3) MG/3ML nebulizer solution 3 mL (3 mLs Nebulization Given 01/02/16 0417)  predniSONE (DELTASONE) tablet 60 mg (60 mg Oral Given 01/02/16 0414)     Initial Impression / Assessment and Plan / ED Course  I have reviewed the triage vital signs and the nursing notes.  Pertinent labs & imaging results that were available during my care of the patient were reviewed by me and considered in my medical decision making (see chart for details).  Clinical Course    Patient presents with shortness of breath. Reports dyspnea on exertion and orthopnea. Nontoxic. Vital signs reassuring. No respiratory distress. History of CHF and COPD. No wheezing on exam. No clinical evidence of systemic volume overload. Patient was given a DuoNeb. Lab work notable for a BNP that is elevated. Chest x-ray shows suspected superimposed pulmonary edema over emphysema. This in conjunction with BNP, more suggestive of  a CHF exacerbation and COPD. Patient has not been on his medication" a long time." He is not taking any medications. He is supposed to be on Cozaar, metoprolol. He likely would benefit from a low-dose of Lasix as well. He was able to ambulate and maintain pulse ox. Will restart medications including Cozaar, metoprolol, and Lasix 20 daily. Follow-up with cardiology for recheck.  Final Clinical Impressions(s) / ED Diagnoses   Final diagnoses:  Acute on chronic systolic congestive heart failure (HCC)    New Prescriptions New Prescriptions   FUROSEMIDE (LASIX) 20 MG TABLET    Take 1 tablet (20 mg total) by mouth daily.   LOSARTAN (COZAAR) 50 MG TABLET    Take 1 tablet (50 mg total) by mouth daily.   METOPROLOL (LOPRESSOR) 25 MG TABLET    Take 2 tablets (50 mg total) by mouth 2 (two) times daily.     Shon Baton, MD 01/02/16 [phone]

## 2016-01-02 NOTE — ED Notes (Signed)
Pt departed in NAD, refusing use of wheelchair.  

## 2016-01-02 NOTE — Discharge Instructions (Signed)
You were seen today for shortness of breath. This is likely related to some heart failure. You need to restart her medications and follow-up with cardiology as soon as possible for recheck.

## 2016-01-09 ENCOUNTER — Inpatient Hospital Stay (HOSPITAL_COMMUNITY)
Admission: EM | Admit: 2016-01-09 | Discharge: 2016-01-13 | DRG: 291 | Disposition: A | Discharge status: Home / Self Care | Payer: Commercial Managed Care - HMO | Attending: Internal Medicine | Admitting: Internal Medicine

## 2016-01-09 ENCOUNTER — Observation Stay (HOSPITAL_COMMUNITY): Payer: Commercial Managed Care - HMO

## 2016-01-09 ENCOUNTER — Emergency Department (HOSPITAL_COMMUNITY): Payer: Commercial Managed Care - HMO

## 2016-01-09 ENCOUNTER — Telehealth: Payer: Self-pay

## 2016-01-09 ENCOUNTER — Encounter (HOSPITAL_COMMUNITY): Payer: Self-pay | Admitting: Emergency Medicine

## 2016-01-09 DIAGNOSIS — F17210 Nicotine dependence, cigarettes, uncomplicated: Secondary | ICD-10-CM

## 2016-01-09 DIAGNOSIS — I5031 Acute diastolic (congestive) heart failure: Secondary | ICD-10-CM | POA: Diagnosis present

## 2016-01-09 DIAGNOSIS — R079 Chest pain, unspecified: Secondary | ICD-10-CM | POA: Diagnosis present

## 2016-01-09 DIAGNOSIS — R0602 Shortness of breath: Secondary | ICD-10-CM | POA: Diagnosis not present

## 2016-01-09 DIAGNOSIS — I5023 Acute on chronic systolic (congestive) heart failure: Secondary | ICD-10-CM

## 2016-01-09 DIAGNOSIS — I5022 Chronic systolic (congestive) heart failure: Secondary | ICD-10-CM

## 2016-01-09 DIAGNOSIS — Z6820 Body mass index (BMI) 20.0-20.9, adult: Secondary | ICD-10-CM

## 2016-01-09 DIAGNOSIS — J9600 Acute respiratory failure, unspecified whether with hypoxia or hypercapnia: Secondary | ICD-10-CM | POA: Diagnosis present

## 2016-01-09 DIAGNOSIS — I110 Hypertensive heart disease with heart failure: Principal | ICD-10-CM | POA: Diagnosis present

## 2016-01-09 DIAGNOSIS — J9621 Acute and chronic respiratory failure with hypoxia: Secondary | ICD-10-CM | POA: Diagnosis present

## 2016-01-09 DIAGNOSIS — Z720 Tobacco use: Secondary | ICD-10-CM | POA: Diagnosis not present

## 2016-01-09 DIAGNOSIS — J449 Chronic obstructive pulmonary disease, unspecified: Secondary | ICD-10-CM | POA: Diagnosis present

## 2016-01-09 DIAGNOSIS — I509 Heart failure, unspecified: Secondary | ICD-10-CM

## 2016-01-09 DIAGNOSIS — E440 Moderate protein-calorie malnutrition: Secondary | ICD-10-CM | POA: Insufficient documentation

## 2016-01-09 DIAGNOSIS — J9601 Acute respiratory failure with hypoxia: Secondary | ICD-10-CM | POA: Diagnosis not present

## 2016-01-09 DIAGNOSIS — Z66 Do not resuscitate: Secondary | ICD-10-CM | POA: Diagnosis present

## 2016-01-09 DIAGNOSIS — Z8673 Personal history of transient ischemic attack (TIA), and cerebral infarction without residual deficits: Secondary | ICD-10-CM

## 2016-01-09 DIAGNOSIS — R1011 Right upper quadrant pain: Secondary | ICD-10-CM

## 2016-01-09 DIAGNOSIS — I5043 Acute on chronic combined systolic (congestive) and diastolic (congestive) heart failure: Secondary | ICD-10-CM | POA: Diagnosis present

## 2016-01-09 DIAGNOSIS — Z833 Family history of diabetes mellitus: Secondary | ICD-10-CM

## 2016-01-09 DIAGNOSIS — I2510 Atherosclerotic heart disease of native coronary artery without angina pectoris: Secondary | ICD-10-CM | POA: Diagnosis present

## 2016-01-09 DIAGNOSIS — I10 Essential (primary) hypertension: Secondary | ICD-10-CM | POA: Diagnosis not present

## 2016-01-09 DIAGNOSIS — Z8249 Family history of ischemic heart disease and other diseases of the circulatory system: Secondary | ICD-10-CM

## 2016-01-09 HISTORY — DX: Pneumonia, unspecified organism: J18.9

## 2016-01-09 HISTORY — DX: Chronic obstructive pulmonary disease, unspecified: J44.9

## 2016-01-09 HISTORY — DX: Cluster headache syndrome, unspecified, not intractable: G44.009

## 2016-01-09 LAB — HEPATIC FUNCTION PANEL
ALT: 110 U/L — ABNORMAL HIGH (ref 17–63)
AST: 147 U/L — ABNORMAL HIGH (ref 15–41)
Albumin: 3.6 g/dL (ref 3.5–5.0)
Alkaline Phosphatase: 168 U/L — ABNORMAL HIGH (ref 38–126)
Bilirubin, Direct: 0.3 mg/dL (ref 0.1–0.5)
Indirect Bilirubin: 0.6 mg/dL (ref 0.3–0.9)
Total Bilirubin: 0.9 mg/dL (ref 0.3–1.2)
Total Protein: 6.4 g/dL — ABNORMAL LOW (ref 6.5–8.1)

## 2016-01-09 LAB — BASIC METABOLIC PANEL
Anion gap: 5 (ref 5–15)
BUN: 12 mg/dL (ref 6–20)
CO2: 29 mmol/L (ref 22–32)
Calcium: 8.7 mg/dL — ABNORMAL LOW (ref 8.9–10.3)
Chloride: 107 mmol/L (ref 101–111)
Creatinine, Ser: 1.05 mg/dL (ref 0.61–1.24)
GFR calc Af Amer: >60 mL/min (ref >60)
GFR calc non Af Amer: >60 mL/min (ref >60)
Glucose, Bld: 129 mg/dL — ABNORMAL HIGH (ref 65–99)
Potassium: 3.5 mmol/L (ref 3.5–5.1)
Sodium: 141 mmol/L (ref 135–145)

## 2016-01-09 LAB — CBC
HCT: 47.5 % (ref 39.0–52.0)
Hemoglobin: 15 g/dL (ref 13.0–17.0)
MCH: 29.9 pg (ref 26.0–34.0)
MCHC: 31.6 g/dL (ref 30.0–36.0)
MCV: 94.8 fL (ref 78.0–100.0)
Platelets: 184 10*3/uL (ref 150–400)
RBC: 5.01 MIL/uL (ref 4.22–5.81)
RDW: 15 % (ref 11.5–15.5)
WBC: 5.8 10*3/uL (ref 4.0–10.5)

## 2016-01-09 LAB — BRAIN NATRIURETIC PEPTIDE: B Natriuretic Peptide: 1587.5 pg/mL — ABNORMAL HIGH (ref 0.0–100.0)

## 2016-01-09 LAB — LIPASE, BLOOD: Lipase: 24 U/L (ref 11–51)

## 2016-01-09 LAB — I-STAT TROPONIN, ED: Troponin i, poc: 0.03 ng/mL (ref 0.00–0.08)

## 2016-01-09 MED ORDER — ENOXAPARIN SODIUM 40 MG/0.4ML ~~LOC~~ SOLN
40.0000 mg | SUBCUTANEOUS | Status: DC
  Administered 2016-01-11: 40 mg via SUBCUTANEOUS
  Filled 2016-01-09 (×3): qty 0.4

## 2016-01-09 MED ORDER — SODIUM CHLORIDE 0.9% FLUSH: 3.0000 mL | INTRAVENOUS | Status: DC | PRN

## 2016-01-09 MED ORDER — IPRATROPIUM-ALBUTEROL 0.5-2.5 (3) MG/3ML IN SOLN
3.0000 mL | Freq: Four times a day (QID) | RESPIRATORY_TRACT | Status: DC
  Filled 2016-01-09: qty 3

## 2016-01-09 MED ORDER — SODIUM CHLORIDE 0.9 % IV SOLN: 250.0000 mL | INTRAVENOUS | Status: DC | PRN

## 2016-01-09 MED ORDER — MAGNESIUM CITRATE PO SOLN: 1.0000 | Freq: Once | ORAL | Status: DC | PRN

## 2016-01-09 MED ORDER — SENNOSIDES-DOCUSATE SODIUM 8.6-50 MG PO TABS: 1.0000 | ORAL_TABLET | Freq: Every evening | ORAL | Status: DC | PRN

## 2016-01-09 MED ORDER — FUROSEMIDE 10 MG/ML IJ SOLN
40.0000 mg | Freq: Once | INTRAMUSCULAR | Status: AC
  Administered 2016-01-09: 40 mg via INTRAVENOUS
  Filled 2016-01-09: qty 4

## 2016-01-09 MED ORDER — METOPROLOL TARTRATE 50 MG PO TABS
50.0000 mg | ORAL_TABLET | Freq: Two times a day (BID) | ORAL | Status: DC
  Administered 2016-01-09 – 2016-01-12 (×6): 50 mg via ORAL
  Filled 2016-01-09 (×6): qty 1

## 2016-01-09 MED ORDER — ACETAMINOPHEN 325 MG PO TABS: 650.0000 mg | ORAL_TABLET | Freq: Four times a day (QID) | ORAL | Status: DC | PRN

## 2016-01-09 MED ORDER — MORPHINE SULFATE (PF) 2 MG/ML IV SOLN
1.0000 mg | INTRAVENOUS | Status: DC | PRN
  Administered 2016-01-10: 1 mg via INTRAVENOUS
  Filled 2016-01-09: qty 1

## 2016-01-09 MED ORDER — PNEUMOCOCCAL VAC POLYVALENT 25 MCG/0.5ML IJ INJ
0.5000 mL | INJECTION | INTRAMUSCULAR | Status: AC
  Administered 2016-01-10: 0.5 mL via INTRAMUSCULAR
  Filled 2016-01-09: qty 0.5

## 2016-01-09 MED ORDER — ONDANSETRON HCL 4 MG/2ML IJ SOLN: 4.0000 mg | Freq: Four times a day (QID) | INTRAMUSCULAR | Status: DC | PRN

## 2016-01-09 MED ORDER — ENSURE ENLIVE PO LIQD: 237.0000 mL | Freq: Two times a day (BID) | ORAL | Status: DC

## 2016-01-09 MED ORDER — NICOTINE 21 MG/24HR TD PT24
21.0000 mg | MEDICATED_PATCH | Freq: Every day | TRANSDERMAL | Status: DC
  Administered 2016-01-09: 21 mg via TRANSDERMAL
  Filled 2016-01-09: qty 1

## 2016-01-09 MED ORDER — HYDROCODONE-ACETAMINOPHEN 5-325 MG PO TABS
1.0000 | ORAL_TABLET | ORAL | Status: DC | PRN
  Administered 2016-01-10 (×2): 2 via ORAL
  Administered 2016-01-11: 1 via ORAL
  Administered 2016-01-11 – 2016-01-12 (×2): 2 via ORAL
  Filled 2016-01-09 (×3): qty 2
  Filled 2016-01-09: qty 1
  Filled 2016-01-09: qty 2

## 2016-01-09 MED ORDER — BISACODYL 10 MG RE SUPP: 10.0000 mg | Freq: Every day | RECTAL | Status: DC | PRN

## 2016-01-09 MED ORDER — NITROGLYCERIN 0.4 MG SL SUBL
0.4000 mg | SUBLINGUAL_TABLET | SUBLINGUAL | Status: DC | PRN
  Administered 2016-01-09: 0.4 mg via SUBLINGUAL
  Filled 2016-01-09: qty 1

## 2016-01-09 MED ORDER — ACETAMINOPHEN 650 MG RE SUPP: 650.0000 mg | Freq: Four times a day (QID) | RECTAL | Status: DC | PRN

## 2016-01-09 MED ORDER — FUROSEMIDE 10 MG/ML IJ SOLN
20.0000 mg | Freq: Two times a day (BID) | INTRAMUSCULAR | Status: DC
  Administered 2016-01-10 – 2016-01-11 (×3): 20 mg via INTRAVENOUS
  Filled 2016-01-09 (×3): qty 2

## 2016-01-09 MED ORDER — SODIUM CHLORIDE 0.9% FLUSH
3.0000 mL | Freq: Two times a day (BID) | INTRAVENOUS | Status: DC
  Administered 2016-01-09 – 2016-01-13 (×8): 3 mL via INTRAVENOUS

## 2016-01-09 MED ORDER — ASPIRIN 81 MG PO CHEW
324.0000 mg | CHEWABLE_TABLET | Freq: Once | ORAL | Status: AC
  Administered 2016-01-09: 324 mg via ORAL
  Filled 2016-01-09: qty 4

## 2016-01-09 MED ORDER — LOSARTAN POTASSIUM 50 MG PO TABS
50.0000 mg | ORAL_TABLET | Freq: Every day | ORAL | Status: DC
  Administered 2016-01-10 – 2016-01-13 (×4): 50 mg via ORAL
  Filled 2016-01-09 (×4): qty 1

## 2016-01-09 MED ORDER — TRAZODONE HCL 50 MG PO TABS: 25.0000 mg | ORAL_TABLET | Freq: Every evening | ORAL | Status: DC | PRN

## 2016-01-09 NOTE — ED Notes (Signed)
Ordered HH tray  

## 2016-01-09 NOTE — ED Triage Notes (Signed)
Pt states is short of breath "all the time-I was here last week with the same, but feel worse now- I think I need to be admitted" pt in no obvious distress--

## 2016-01-09 NOTE — ED Provider Notes (Signed)
[facility]-EMERGENCY DEPT Provider Note   CSN: [id number] Arrival date & time: 01/09/16  0920  First Provider Contact:  None       History   Chief Complaint Chief Complaint  Patient presents with  . Shortness of Breath    HPI Bob Lagrave is a 60 y.o. male.  HPI   Progression of shortness of breath, seen last week for the same. twinging and nagging chest pain, sensation of generalized weakness, feeling like cant catch his breath. Productive cough thin white sputum, no fevers or chills. No swelling of legs.  Dyspnea with exertion, orthopnea. Slow onset of symptoms, progressing since last week.   Last week was started on cozaar, metoprolol and lasix 20mg  daily and was recommended to see Cardiology however has not yet.  Symptoms have progressed despite taking medications.  Past Medical History:  Diagnosis Date  . CAD (coronary artery disease)   . CAP (community acquired pneumonia) 09/2013  . CHF (congestive heart failure) (HCC)    [name] 01/09/2016  . Cluster headache    "hx; haven't had one in awhile" (01/09/2016)  . COPD (chronic obstructive pulmonary disease) (HCC)    [name] 01/09/2016  . Hypertension   . Shortness of breath   . Stroke (HCC)   . Systolic heart failure (HCC)   . Tobacco abuse     Patient Active Problem List   Diagnosis Date Noted  . Chest pain 01/09/2016  . CHF exacerbation (HCC) 01/09/2016  . Tobacco abuse 01/09/2016  . COPD bronchitis 01/15/2014  . Atrial tachycardia, paroxysmal (HCC) 10/07/2013  . CAD (coronary artery disease), native coronary artery 10/07/2013  . 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 COPD with acute exacerbation (HCC) 10/04/2013  . Acute diastolic congestive heart failure (HCC) 10/04/2013  . Sinus tachycardia (HCC) 10/04/2013  . CAP (community acquired pneumonia) 10/03/2013  . Acute respiratory failure (HCC) 10/03/2013  . Hypertension     Past Surgical History:  Procedure Laterality Date  . CARDIAC  CATHETERIZATION  10/2010   LM normal, LAD with 20% irregularities, LCX with 20%, RCA with 40% prox and 40% distal - EF of 35%  . COLONOSCOPY W/ BIOPSIES AND POLYPECTOMY         Home Medications    Prior to Admission medications   Medication Sig Start Date End Date Taking? Authorizing Provider  furosemide (LASIX) 20 MG tablet Take 1 tablet (20 mg total) by mouth daily. 01/02/16  Yes Shon Baton, MD  losartan (COZAAR) 50 MG tablet Take 1 tablet (50 mg total) by mouth daily. 01/02/16  Yes Shon Baton, MD  metoprolol (LOPRESSOR) 25 MG tablet Take 2 tablets (50 mg total) by mouth 2 (two) times daily. 01/02/16  Yes Shon Baton, MD    Family History Family History  Problem Relation Age of Onset  . Cancer Mother   . Heart disease Mother   . Diabetes Mother   . Cancer Father   . Diabetes Father   . Diabetes Sister   . Diabetes Brother     Social History Social History  Substance Use Topics  . Smoking status: Current Every Day Smoker    Packs/day: 1.00    Years: 42.00    Types: Cigarettes  . Smokeless tobacco: Never Used  . Alcohol use 3.6 oz/week    6 Glasses of wine per week     Comment: 01/09/2016 "1/5th (of wine mostly) over the weekend"     Allergies   Lisinopril   Review of Systems Review of  Systems  Constitutional: Positive for fatigue. Negative for fever.  HENT: Negative for sore throat.   Eyes: Negative for visual disturbance.  Respiratory: Positive for cough and shortness of breath.   Cardiovascular: Positive for chest pain. Negative for leg swelling.  Gastrointestinal: Positive for abdominal pain (tenderness with pressing on it). Negative for diarrhea, nausea and vomiting.  Genitourinary: Negative for difficulty urinating.  Musculoskeletal: Negative for back pain and neck stiffness.  Skin: Negative for rash.  Neurological: Negative for syncope and headaches.     Physical Exam Updated Vital Signs BP (!) 165/90 (BP Location: Right Arm)    Pulse 83   Temp 98.3 F (36.8 C) (Oral)   Resp 18   Ht 6\' 3"  (1.905 m)   Wt 166 lb 3.2 oz (75.4 kg) Comment: scale c  SpO2 100%   BMI 20.77 kg/m   Physical Exam  Constitutional: He is oriented to person, place, and time. He appears well-developed and well-nourished. No distress.  HENT:  Head: Normocephalic and atraumatic.  Eyes: Conjunctivae and EOM are normal.  Neck: Normal range of motion. JVD present.  Cardiovascular: Normal rate, regular rhythm, normal heart sounds and intact distal pulses.  Exam reveals no gallop and no friction rub.   No murmur heard. Pulmonary/Chest: Effort normal. No respiratory distress. He has decreased breath sounds. He has no wheezes. He has no rales.  Abdominal: Soft. He exhibits no distension. There is tenderness in the right upper quadrant. There is no guarding.  Musculoskeletal: He exhibits no edema.  Neurological: He is alert and oriented to person, place, and time.  Skin: Skin is warm and dry. He is not diaphoretic.  Nursing note and vitals reviewed.    ED Treatments / Results  Labs (all labs ordered are listed, but only abnormal results are displayed) Labs Reviewed  BASIC METABOLIC PANEL - Abnormal; Notable for the following:       Result Value   Glucose, Bld 129 (*)    Calcium 8.7 (*)    All other components within normal limits  HEPATIC FUNCTION PANEL - Abnormal; Notable for the following:    Total Protein 6.4 (*)    AST 147 (*)    ALT 110 (*)    Alkaline Phosphatase 168 (*)    All other components within normal limits  BRAIN NATRIURETIC PEPTIDE - Abnormal; Notable for the following:    B Natriuretic Peptide 1,587.5 (*)    All other components within normal limits  CBC  LIPASE, BLOOD  BRAIN NATRIURETIC PEPTIDE  COMPREHENSIVE METABOLIC PANEL  CBC  HEPATITIS PANEL, ACUTE  I-STAT TROPOININ, ED    EKG  EKG Interpretation None       Radiology Dg Chest 2 View  Result Date: 01/09/2016 CLINICAL DATA:  Chest pain for 8 days  EXAM: CHEST  2 VIEW COMPARISON:  January 02, 2016 FINDINGS: There are scattered areas of patchy fibrosis, primarily in the bases. There is no edema or consolidation. Heart is borderline enlarged with pulmonary vascularity within normal limits. There is atherosclerotic calcification in the aorta. No adenopathy. There is an old healed fracture of the left clavicle. IMPRESSION: Scattered areas of fibrotic change. No frank edema or consolidation. Stable cardiac prominence. Aortic atherosclerosis. Electronically Signed   By: Bretta Bang III M.D.   On: 01/09/2016 10:22  US Abdomen Limited Ruq  Result Date: 01/09/2016 CLINICAL DATA:  Acute right upper quadrant abdominal pain. EXAM: US ABDOMEN LIMITED - RIGHT UPPER QUADRANT COMPARISON:  None. FINDINGS: Gallbladder: No gallstones or wall  thickening visualized. No sonographic Murphy sign noted by sonographer. Common bile duct: Diameter: 2 mm which is within normal limits. Liver: No focal lesion identified. Within normal limits in parenchymal echogenicity. IMPRESSION: No definite abnormality seen in the right upper quadrant of the abdomen. Electronically Signed   By: Lupita Raider, M.D.   On: 01/09/2016 16:06    Procedures Procedures (including critical care time)  Medications Ordered in ED Medications  nitroGLYCERIN (NITROSTAT) SL tablet 0.4 mg (0.4 mg Sublingual Given 01/09/16 1136)  metoprolol tartrate (LOPRESSOR) tablet 50 mg (not administered)  sodium chloride flush (NS) 0.9 % injection 3 mL (not administered)  sodium chloride flush (NS) 0.9 % injection 3 mL (not administered)  0.9 %  sodium chloride infusion (not administered)  ondansetron (ZOFRAN) injection 4 mg (not administered)  furosemide (LASIX) injection 20 mg (not administered)  ipratropium-albuterol (DUONEB) 0.5-2.5 (3) MG/3ML nebulizer solution 3 mL (not administered)  acetaminophen (TYLENOL) tablet 650 mg (not administered)    Or  acetaminophen (TYLENOL) suppository 650 mg (not  administered)  HYDROcodone-acetaminophen (NORCO/VICODIN) 5-325 MG per tablet 1-2 tablet (not administered)  morphine 2 MG/ML injection 1 mg (not administered)  traZODone (DESYREL) tablet 25 mg (not administered)  senna-docusate (Senokot-S) tablet 1 tablet (not administered)  bisacodyl (DULCOLAX) suppository 10 mg (not administered)  magnesium citrate solution 1 Bottle (not administered)  enoxaparin (LOVENOX) injection 40 mg (not administered)  losartan (COZAAR) tablet 50 mg (not administered)  nicotine (NICODERM CQ - dosed in mg/24 hours) patch 21 mg (not administered)  feeding supplement (ENSURE ENLIVE) (ENSURE ENLIVE) liquid 237 mL (not administered)  pneumococcal 23 valent vaccine (PNU-IMMUNE) injection 0.5 mL (not administered)  aspirin chewable tablet 324 mg (324 mg Oral Given 01/09/16 1132)  furosemide (LASIX) injection 40 mg (40 mg Intravenous Given 01/09/16 1322)     Initial Impression / Assessment and Plan / ED Course  I have reviewed the triage vital signs and the nursing notes.  Pertinent labs & imaging results that were available during my care of the patient were reviewed by me and considered in my medical decision making (see chart for details).  Clinical Course   60 year old male with a history of COPD, systolic heart failure, coronary artery disease, hypertension CVA, emergency department one week ago for concern of dyspnea, nagging chest painand re-started on Lasix  Presents with concern of continuing dyspnea.  Chest x-ray today shows no sign of pneumothorax or pneumonia. No significant wheezing on exam. Patient does have JVD on exam, and BNP which has increased from 600 last week to 1600.  Overall feel CHF exacerbation is more likely than pulmonary embolus. Patient with right upper quadrant tenderness on exam,  and ultrasound was ordered which showed  no evidence of cholecystitis. Elevation and LFTs is likely secondary to hepatic congestion in setting of congestive heart  failure.  Will admit for continued evaluation of symptoms, and diuresis. Patient was given 40 mg of IV Lasix, aspirin.  Final Clinical Impressions(s) / ED Diagnoses   Final diagnoses:  RUQ abdominal pain  Acute on chronic systolic congestive heart failure (HCC)  Chest pain, unspecified chest pain type    New Prescriptions Current Discharge Medication List       Alvira Monday, MD 01/09/16 2207

## 2016-01-09 NOTE — Telephone Encounter (Signed)
Patient walked in to office this morning complaining of SOB and trouble sleeping. Informed patient of our office walk-in policy. Informed patient that he could see a PA later today or tomorrow. Patient stated he doesn't think he can wait that long. Patient's O2 sat was 92%, HR 79, Resp. 18. Patient stated he has been having trouble sleeping, and he can not lay down at night because of his heart. Patient stated that he feels like he needs to be in the hospital. Advised patient that we could call EMS to take him to the hospital. Patient refused and stated he would go over to the ED. Made an appointment for patient with Tereso Newcomer PA tomorrow, incase patient decides not to go to ED. Patient verbalized understanding and signed AMA form.

## 2016-01-09 NOTE — ED Notes (Signed)
Patient transported to X-ray 

## 2016-01-09 NOTE — H&P (Signed)
History and Physical    Jared [name] ZOX:[id number] DOB: March 14, 1956 DOA: 01/09/2016   PCP: Doris Cheadle, MD   Patient coming from:  Home  Chief Complaint: Shortness of Breath  HPI: Jared [name] is a 60 y.o. male with medical history significant for CABG, hypertension, prior history of stroke without residual, systolic heart failure, current tobacco abuse, chronic headaches, presenting to the emergency department with generalized weakness, and increased shortness of breath, along with  white sputum production. He also reports dyspnea on exertion. In addition, he reports epigastric abdominal pain, which may radiate to the lower portion of his sternum versus chest pain . He denies any lower extremity edema.  Admits to salt rich food ingestion .The symptoms are similar to those of one week ago, at which time, workup was suspicious for acute exacerbation of systolic heart failure. She was started on Cozaar, metoprolol and Lasix 20 mg daily at the time, and was recommended to see cardiology, which he failed to do so. His cardiologist is Dr. Dietrich Pates, last seen in December 2016.the patient continues to smoke 1 pack a day of cigarettes, specific recommendations on the prior week to discontinue its use.He took his BP meds this morning     ED Course:  BP (!) 158/102   Pulse 80   Temp 98.9 F (37.2 C) (Oral)   Resp 24   SpO2 100%    CMET was normal, glucose 129, ALT 110 AST 147, bilirubin normal at 0.9 CBCnormal Chest x-ray without edema or consolidation, some areas of fibrotic change, no adenopathy. Ultrasound of the abdomen negative for gallstones or wall thickening.no abnormalities seen. Troponin 0.03  BNP 1587.5 ; data from 01/02/2016 was 683.6 EKG normal sinus rhythm without ACS  unchanged from prior. Prior x-ray 1 week ago showed superimposed pulmonary edema over emphysema, and was discharged on steroids and nebulizers at the time. Receive Nitrostat and aspirin Received Lasix 40 mg IV  1   Review of Systems: As per HPI otherwise 10 point review of systems negative.   Past Medical History:  Diagnosis Date  . CAD (coronary artery disease)   . Headache(784.0)   . Hypertension   . Shortness of breath   . Stroke (HCC)   . Systolic heart failure (HCC)   . Tobacco abuse     Past Surgical History:  Procedure Laterality Date  . CARDIAC CATHETERIZATION  10/2010   LM normal, LAD with 20% irregularities, LCX with 20%, RCA with 40% prox and 40% distal - EF of 35%    Social History Social History   Social History  . Marital status: Married    Spouse name: N/A  . Number of children: N/A  . Years of education: N/A   Occupational History  . Not on file.   Social History Main Topics  . Smoking status: Current Every Day Smoker    Packs/day: 0.00    Years: 40.00    Types: Cigarettes  . Smokeless tobacco: Never Used     Comment: Smoking 1ppd  . Alcohol use 0.0 oz/week     Comment: occasionally  . Drug use: No     Comment: last use was 3 years ago   . Sexual activity: Yes   Other Topics Concern  . Not on file   Social History Narrative  . No narrative on file     Allergies  Allergen Reactions  . Lisinopril Cough    Family History  Problem Relation Age of Onset  . Cancer Mother   .  Heart disease Mother   . Diabetes Mother   . Cancer Father   . Diabetes Father   . Diabetes Sister   . Diabetes Brother       Prior to Admission medications   Medication Sig Start Date End Date Taking? Authorizing Provider  furosemide (LASIX) 20 MG tablet Take 1 tablet (20 mg total) by mouth daily. 01/02/16  Yes Shon Baton, MD  losartan (COZAAR) 50 MG tablet Take 1 tablet (50 mg total) by mouth daily. 01/02/16  Yes Shon Baton, MD  metoprolol (LOPRESSOR) 25 MG tablet Take 2 tablets (50 mg total) by mouth 2 (two) times daily. 01/02/16  Yes Shon Baton, MD    Physical Exam:    Vitals:   01/09/16 1315 01/09/16 1445 01/09/16 1630 01/09/16 1657   BP: (!) 166/109 (!) 180/113 (!) 158/102   Pulse: 73 79 80   Resp: 22 22 24    Temp:      TempSrc:      SpO2: 94% 100% 93% 100%       Constitutional: NAD, calm, comfortable   Vitals:   01/09/16 1315 01/09/16 1445 01/09/16 1630 01/09/16 1657  BP: (!) 166/109 (!) 180/113 (!) 158/102   Pulse: 73 79 80   Resp: 22 22 24    Temp:      TempSrc:      SpO2: 94% 100% 93% 100%   Eyes: PERRL, lids and conjunctivae normal ENMT: Mucous membranes are moist. Posterior pharynx clear of any exudate or lesions.Normal dentition.  Neck: normal, supple, no masses, no thyromegaly. No JVP  Respiratory: clear to auscultation bilaterally, no wheezing, no crackles. Normal respiratory effort. No accessory muscle use.  Cardiovascular: Regular rate and rhythm, no murmurs / rubs / gallops. No extremity edema. 2+ pedal pulses. No carotid bruits.  Abdomen:mild RUQ tenderness, no masses palpated. No hepatosplenomegaly. Bowel sounds positive.  Musculoskeletal: no clubbing / cyanosis. No joint deformity upper and lower extremities. Good ROM, no contractures. Normal muscle tone.  Skin: no rashes, lesions, ulcers.  Neurologic: CN 2-12 grossly intact. Sensation intact, DTR normal. Strength 5/5 in all 4.  Psychiatric: Normal judgment and insight. Alert and oriented x 3. Normal mood.     Labs on Admission: I have personally reviewed following labs and imaging studies  CBC:  Recent Labs Lab 01/09/16 0931  WBC 5.8  HGB 15.0  HCT 47.5  MCV 94.8  PLT 184    Basic Metabolic Panel:  Recent Labs Lab 01/09/16 0931  NA 141  K 3.5  CL 107  CO2 29  GLUCOSE 129*  BUN 12  CREATININE 1.05  CALCIUM 8.7*    GFR: CrCl cannot be calculated (Unknown ideal weight.).  Liver Function Tests:  Recent Labs Lab 01/09/16 1137  AST 147*  ALT 110*  ALKPHOS 168*  BILITOT 0.9  PROT 6.4*  ALBUMIN 3.6    Recent Labs Lab 01/09/16 1137  LIPASE 24   No results for input(s): AMMONIA in the last 168  hours.  Coagulation Profile: No results for input(s): INR, PROTIME in the last 168 hours.  Cardiac Enzymes: No results for input(s): CKTOTAL, CKMB, CKMBINDEX, TROPONINI in the last 168 hours.  BNP (last 3 results) No results for input(s): PROBNP in the last 8760 hours.  HbA1C: No results for input(s): HGBA1C in the last 72 hours.  CBG: No results for input(s): GLUCAP in the last 168 hours.  Lipid Profile: No results for input(s): CHOL, HDL, LDLCALC, TRIG, CHOLHDL, LDLDIRECT in the last 72 hours.  Thyroid Function Tests: No results for input(s): TSH, T4TOTAL, FREET4, T3FREE, THYROIDAB in the last 72 hours.  Anemia Panel: No results for input(s): VITAMINB12, FOLATE, FERRITIN, TIBC, IRON, RETICCTPCT in the last 72 hours.  Urine analysis: No results found for: COLORURINE, APPEARANCEUR, LABSPEC, PHURINE, GLUCOSEU, HGBUR, BILIRUBINUR, KETONESUR, PROTEINUR, UROBILINOGEN, NITRITE, LEUKOCYTESUR  Sepsis Labs: (procalcitonin:4,lacticidven:4) )No results found for this or any previous visit (from the past 240 hour(s)).   Radiological Exams on Admission: Dg Chest 2 View  Result Date: 01/09/2016 CLINICAL DATA:  Chest pain for 8 days EXAM: CHEST  2 VIEW COMPARISON:  January 02, 2016 FINDINGS: There are scattered areas of patchy fibrosis, primarily in the bases. There is no edema or consolidation. Heart is borderline enlarged with pulmonary vascularity within normal limits. There is atherosclerotic calcification in the aorta. No adenopathy. There is an old healed fracture of the left clavicle. IMPRESSION: Scattered areas of fibrotic change. No frank edema or consolidation. Stable cardiac prominence. Aortic atherosclerosis. Electronically Signed   By: Bretta Bang III M.D.   On: 01/09/2016 10:22  US Abdomen Limited Ruq  Result Date: 01/09/2016 CLINICAL DATA:  Acute right upper quadrant abdominal pain. EXAM: US ABDOMEN LIMITED - RIGHT UPPER QUADRANT COMPARISON:  None. FINDINGS:  Gallbladder: No gallstones or wall thickening visualized. No sonographic Murphy sign noted by sonographer. Common bile duct: Diameter: 2 mm which is within normal limits. Liver: No focal lesion identified. Within normal limits in parenchymal echogenicity. IMPRESSION: No definite abnormality seen in the right upper quadrant of the abdomen. Electronically Signed   By: Lupita Raider, M.D.   On: 01/09/2016 16:06    EKG: Independently reviewed.  Assessment/Plan Active Problems:   Hypertension   Acute respiratory failure (HCC)   Acute diastolic congestive heart failure (HCC)   CAD (coronary artery disease), native coronary artery   Chest pain   CHF exacerbation (HCC)   Tobacco abuse  Acute respiratory failure likely due to acute on chronic combined  CHF exacerbation, BNP worsening from 1 week ago to 1587.5 ; data from 01/02/2016 was 683.6  Despite diuretics, and the addition of Cozaar and BB. 1587.5 CXR without edema or consolidation, some areas of fibrotic change, no adenopathy. Tn negative. EKG without ACS -given40mg  IV lasix in ED. Afebrile. WBC normal. LAst 2 D echo in 2016, EF 40-45, NICM, mild to mod reduced systolic function diffuse hypokynesis. Osats 89 in RA, normal at O2   Admit to tele obs  CHF order set  -continue ARB, Coreg Continue to diurese to 20 mg IV bid  -monitor I/Os -daily weights -prn 02 Duo Nebs in view of his history of COPD 2 D echo      Abnormal LFTs, in the setting of CHF, with abdominal/ epigastric discomfort  . ALT 110 AST 147, bilirubin normal at 0.9. Abd [place] negative  No history of  Hepatitis  Repeat LFTs in am   IVF  When fluid overload improves     Hypertension BP  158/102   Pulse 80 worsened due to fluid overload. THis continues to improve, was in the 200s/100s on admission.  Continue home anti-hypertensive medications    CAD,  EKG, last cardiac catheterization 2012, treated medically. EKG without ACS, Tn neg  Continue ASA high dose statin, beta  blockers   Tobacco abuse   -  Nicotine patch 21 qd (1ppd)  -  Counseled cessation   DVT prophylaxis: Lovenox  Code Status:  DNR Family Communication:  Discussed with patient  Disposition Plan: Expect patient  to be discharged to home after condition improves Consults called:    None Admission status:Tele  Obs   [facility] E, PA-C [facility]   If 7PM-7AM, please contact night-coverage [email or web address] Password [facility]  01/09/2016, 5:17 PM

## 2016-01-09 NOTE — Progress Notes (Deleted)
Cardiology Office Note:    Date:  01/09/2016   ID:  Jared [name], DOB [birth year], MRN [id number]  PCP:  Doris Cheadle, MD  Cardiologist:  Dr. Dietrich Pates   Electrophysiologist:  n/a  Referring MD: No ref. provider found   No chief complaint on file.   History of Present Illness:    Jared [name] is a 60 y.o. male with a hx of ***    Prior CV studies that were reviewed today include:    Myoview 4/16 Overall Impression:  High risk stress nuclear study There is no evidence of ischemia.  There is severe LV dysfunction. LV Ejection Fraction: 30%.  LV Wall Motion:  There is global LV hypokinesis.    Echo 4/16 - Left ventricle: The cavity size was mildly dilated. Wall   thickness was increased in a pattern of mild LVH. Systolic   function was mildly to moderately reduced. The estimated ejection   fraction was in the range of 40% to 45%. Diffuse hypokinesis.  Impressions:  - Limited study to assess LV function; doppler not performed; mild   to moderate global reduction in LV function (EF 40-45).  Cardiac cath (10/11/2010)  Done for [facility])   Known cardiomyopathy prior. LM normal  LAD 20% irregularities LCX 20% proximal RCA:  Dominant; 40% proximal, 40% distal LVEF 35% with diffuse hypokinesis.  Past Medical History:  Diagnosis Date  . CAD (coronary artery disease)   . CAP (community acquired pneumonia) 09/2013  . CHF (congestive heart failure) (HCC)    [name] 01/09/2016  . Cluster headache    "hx; haven't had one in awhile" (01/09/2016)  . COPD (chronic obstructive pulmonary disease) (HCC)    [name] 01/09/2016  . Hypertension   . Shortness of breath   . Stroke (HCC)   . Systolic heart failure (HCC)   . Tobacco abuse     Past Surgical History:  Procedure Laterality Date  . CARDIAC CATHETERIZATION  10/2010   LM normal, LAD with 20% irregularities, LCX with 20%, RCA with 40% prox and 40% distal - EF of 35%  . COLONOSCOPY W/ BIOPSIES AND  POLYPECTOMY      Current Medications: Facility-Administered Medications Prior to Visit  Medication Dose Route Frequency Provider Last Rate Last Dose  . 0.9 %  sodium chloride infusion  250 mL Intravenous PRN Marcos Eke, PA-C      . acetaminophen (TYLENOL) tablet 650 mg  650 mg Oral Q6H PRN Marcos Eke, PA-C       Or  . acetaminophen (TYLENOL) suppository 650 mg  650 mg Rectal Q6H PRN Marcos Eke, PA-C      . bisacodyl (DULCOLAX) suppository 10 mg  10 mg Rectal Daily PRN Marcos Eke, PA-C      . enoxaparin (LOVENOX) injection 40 mg  40 mg Subcutaneous Q24H Marcos Eke, PA-C      . Melene Muller ON 01/10/2016] feeding supplement (ENSURE ENLIVE) (ENSURE ENLIVE) liquid 237 mL  237 mL Oral BID BM Marcos Eke, PA-C      . furosemide (LASIX) injection 20 mg  20 mg Intravenous BID Marcos Eke, PA-C      . HYDROcodone-acetaminophen (NORCO/VICODIN) 5-325 MG per tablet 1-2 tablet  1-2 tablet Oral Q4H PRN Marcos Eke, PA-C      . ipratropium-albuterol (DUONEB) 0.5-2.5 (3) MG/3ML nebulizer solution 3 mL  3 mL Nebulization Q6H Sung Amabile Wertman, PA-C      . losartan (COZAAR) tablet 50  mg  50 mg Oral Daily Marcos Eke, PA-C      . magnesium citrate solution 1 Bottle  1 Bottle Oral Once PRN Marcos Eke, PA-C      . metoprolol tartrate (LOPRESSOR) tablet 50 mg  50 mg Oral BID Marcos Eke, PA-C      . morphine 2 MG/ML injection 1 mg  1 mg Intravenous Q4H PRN Marcos Eke, PA-C      . nicotine (NICODERM CQ - dosed in mg/24 hours) patch 21 mg  21 mg Transdermal Daily Marcos Eke, PA-C      . nitroGLYCERIN (NITROSTAT) SL tablet 0.4 mg  0.4 mg Sublingual Q5 min PRN Alvira Monday, MD   0.4 mg at 01/09/16 1136  . ondansetron (ZOFRAN) injection 4 mg  4 mg Intravenous Q6H PRN Marcos Eke, PA-C      . [START ON 01/10/2016] pneumococcal 23 valent vaccine (PNU-IMMUNE) injection 0.5 mL  0.5 mL Intramuscular Tomorrow-1000 Marcos Eke, PA-C      . senna-docusate (Senokot-S) tablet 1 tablet   1 tablet Oral QHS PRN Marcos Eke, PA-C      . sodium chloride flush (NS) 0.9 % injection 3 mL  3 mL Intravenous Q12H Marcos Eke, PA-C      . sodium chloride flush (NS) 0.9 % injection 3 mL  3 mL Intravenous PRN Marcos Eke, PA-C      . traZODone (DESYREL) tablet 25 mg  25 mg Oral QHS PRN Marcos Eke, PA-C       Outpatient Medications Prior to Visit  Medication Sig Dispense Refill  . furosemide (LASIX) 20 MG tablet Take 1 tablet (20 mg total) by mouth daily. 15 tablet 0  . losartan (COZAAR) 50 MG tablet Take 1 tablet (50 mg total) by mouth daily. 15 tablet 0  . metoprolol (LOPRESSOR) 25 MG tablet Take 2 tablets (50 mg total) by mouth 2 (two) times daily. 30 tablet 0      Allergies:   Lisinopril   Social History   Social History  . Marital status: Married    Spouse name: N/A  . Number of children: N/A  . Years of education: N/A   Social History Main Topics  . Smoking status: Current Every Day Smoker    Packs/day: 1.00    Years: 42.00    Types: Cigarettes  . Smokeless tobacco: Never Used  . Alcohol use 3.6 oz/week    6 Glasses of wine per week     Comment: 01/09/2016 "1/5th (of wine mostly) over the weekend"  . Drug use:     Types: Cocaine, Marijuana     Comment: 01/09/2016 "none in 2000s"  . Sexual activity: Not Currently   Other Topics Concern  . Not on file   Social History Narrative  . No narrative on file     Family History:  The patient's ***family history includes Cancer in his father and mother; Diabetes in his brother, father, mother, and sister; Heart disease in his mother.   ROS:   Please see the history of present illness.    ROS All other systems reviewed and are negative.   EKGs/Labs/Other Test Reviewed:    EKG:  EKG is *** ordered today.  The ekg ordered today demonstrates ***  Recent Labs: 01/09/2016: ALT 110; B Natriuretic Peptide 1,587.5; BUN 12; Creatinine, Ser 1.05; Hemoglobin 15.0; Platelets 184; Potassium 3.5; Sodium 141   Recent  Lipid Panel    Component Value Date/Time  CHOL 170 10/04/2013 0502   TRIG 65 10/04/2013 0502   HDL 51 10/04/2013 0502   CHOLHDL 3.3 10/04/2013 0502   VLDL 13 10/04/2013 0502   LDLCALC 106 (H) 10/04/2013 0502     Physical Exam:    VS:  There were no vitals taken for this visit.    Wt Readings from Last 3 Encounters:  01/09/16 166 lb 3.2 oz (75.4 kg)  08/18/15 175 lb (79.4 kg)  06/13/15 178 lb 4 oz (80.9 kg)     ***Physical Exam    ASSESSMENT:    No diagnosis found. PLAN:    In order of problems listed above:  1. ***   Medication Adjustments/Labs and Tests Ordered: Current medicines are reviewed at length with the patient today.  Concerns regarding medicines are outlined above.  Medication changes, Labs and Tests ordered today are outlined in the Patient Instructions noted below. There are no Patient Instructions on file for this visit. Signed, Tereso Newcomer, PA-C  01/09/2016 10:24 PM    [name] [address] [place], [place], [place]  [address] Phone: [phone]; Fax: [phone]

## 2016-01-10 ENCOUNTER — Observation Stay (HOSPITAL_BASED_OUTPATIENT_CLINIC_OR_DEPARTMENT_OTHER): Payer: Commercial Managed Care - HMO

## 2016-01-10 ENCOUNTER — Ambulatory Visit: Payer: Commercial Managed Care - HMO | Admitting: Physician Assistant

## 2016-01-10 DIAGNOSIS — I2510 Atherosclerotic heart disease of native coronary artery without angina pectoris: Secondary | ICD-10-CM | POA: Diagnosis not present

## 2016-01-10 DIAGNOSIS — I5023 Acute on chronic systolic (congestive) heart failure: Secondary | ICD-10-CM | POA: Diagnosis not present

## 2016-01-10 DIAGNOSIS — I10 Essential (primary) hypertension: Secondary | ICD-10-CM | POA: Diagnosis not present

## 2016-01-10 DIAGNOSIS — I509 Heart failure, unspecified: Secondary | ICD-10-CM

## 2016-01-10 DIAGNOSIS — E440 Moderate protein-calorie malnutrition: Secondary | ICD-10-CM | POA: Insufficient documentation

## 2016-01-10 DIAGNOSIS — J9601 Acute respiratory failure with hypoxia: Secondary | ICD-10-CM | POA: Diagnosis not present

## 2016-01-10 LAB — COMPREHENSIVE METABOLIC PANEL
ALT: 90 U/L — ABNORMAL HIGH (ref 17–63)
AST: 68 U/L — ABNORMAL HIGH (ref 15–41)
Albumin: 3.4 g/dL — ABNORMAL LOW (ref 3.5–5.0)
Alkaline Phosphatase: 162 U/L — ABNORMAL HIGH (ref 38–126)
Anion gap: 6 (ref 5–15)
BUN: 12 mg/dL (ref 6–20)
CO2: 34 mmol/L — ABNORMAL HIGH (ref 22–32)
Calcium: 8.9 mg/dL (ref 8.9–10.3)
Chloride: 104 mmol/L (ref 101–111)
Creatinine, Ser: 0.99 mg/dL (ref 0.61–1.24)
GFR calc Af Amer: >60 mL/min (ref >60)
GFR calc non Af Amer: >60 mL/min (ref >60)
Glucose, Bld: 118 mg/dL — ABNORMAL HIGH (ref 65–99)
Potassium: 3.5 mmol/L (ref 3.5–5.1)
Sodium: 144 mmol/L (ref 135–145)
Total Bilirubin: 0.6 mg/dL (ref 0.3–1.2)
Total Protein: 6.1 g/dL — ABNORMAL LOW (ref 6.5–8.1)

## 2016-01-10 LAB — HEPATITIS PANEL, ACUTE
HCV Ab: <0.1 s/co ratio (ref 0.0–0.9)
Hep A IgM: NEGATIVE
Hep B C IgM: NEGATIVE
Hepatitis B Surface Ag: NEGATIVE

## 2016-01-10 LAB — ECHOCARDIOGRAM COMPLETE
Height: 75 in
Weight: 2641.6 oz

## 2016-01-10 LAB — CBC
HCT: 47.5 % (ref 39.0–52.0)
Hemoglobin: 14.7 g/dL (ref 13.0–17.0)
MCH: 29.6 pg (ref 26.0–34.0)
MCHC: 30.9 g/dL (ref 30.0–36.0)
MCV: 95.8 fL (ref 78.0–100.0)
Platelets: 197 10*3/uL (ref 150–400)
RBC: 4.96 MIL/uL (ref 4.22–5.81)
RDW: 14.9 % (ref 11.5–15.5)
WBC: 7.5 10*3/uL (ref 4.0–10.5)

## 2016-01-10 LAB — BRAIN NATRIURETIC PEPTIDE: B Natriuretic Peptide: 2056.8 pg/mL — ABNORMAL HIGH (ref 0.0–100.0)

## 2016-01-10 MED ORDER — NICOTINE 21 MG/24HR TD PT24
21.0000 mg | MEDICATED_PATCH | Freq: Every day | TRANSDERMAL | Status: DC
  Administered 2016-01-10 – 2016-01-13 (×4): 21 mg via TRANSDERMAL
  Filled 2016-01-10 (×4): qty 1

## 2016-01-10 MED ORDER — ASPIRIN EC 81 MG PO TBEC
81.0000 mg | DELAYED_RELEASE_TABLET | Freq: Every day | ORAL | Status: DC
  Administered 2016-01-10 – 2016-01-13 (×4): 81 mg via ORAL
  Filled 2016-01-10 (×4): qty 1

## 2016-01-10 MED ORDER — IPRATROPIUM-ALBUTEROL 0.5-2.5 (3) MG/3ML IN SOLN: 3.0000 mL | RESPIRATORY_TRACT | Status: DC | PRN

## 2016-01-10 NOTE — Progress Notes (Signed)
Initial Nutrition Assessment  DOCUMENTATION CODES:   Non-severe (moderate) malnutrition in context of social or environmental circumstances  INTERVENTION:  Provide snacks BID Encourage PO intake   NUTRITION DIAGNOSIS:   Malnutrition related to social / environmental circumstances as evidenced by mild depletion of body fat, moderate depletions of muscle mass.   GOAL:   Patient will meet greater than or equal to 90% of their needs   MONITOR:   PO intake, Labs, Weight trends, I & O's  REASON FOR ASSESSMENT:   Malnutrition Screening Tool    ASSESSMENT:   60 y.o. male with a Past Medical History of CAD, HA, HTN, CVA, Tobacco use, CHF who presents with acute respiratory failure likely secondary to CHF exacerbation with underlying history of COPD there are no acute COPD exacerbation.  Pt reports losing from 236 lbs 2 years ago to 165 lbs. 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 He also reports eating only 2 meals daily, breakfast and dinner. When asked why he skips lunch, he states that this is because he can't afford to. RD provided some affordable low sodium snack/meal ideas. Encouraged patient to eat a few snacks or one meal during the work day and emphasized the importance of nutrition. Pt reports eating 100% of meals since admission. Pt has moderate muscle wasting and mild fat wasting per nutrition-focused physical exam. Weight history shows that patient weighed 203 lbs 2 years ago and has lost from 193 lbs to 165 lbs in the past year - 13% weight loss.  He does note want any nutritional supplements, but is agreeable to receiving snacks while in hospital.   Labs reviewed.   Diet Order:  Diet Heart Room service appropriate? Yes; Fluid consistency: Thin  Skin:  Reviewed, no issues  Last BM:  7/31  Height:   Ht Readings from Last 1 Encounters:  01/09/16 6\' 3"  (1.905 m)    Weight:   Wt Readings from Last 1  Encounters:  01/10/16 165 lb 1.6 oz (74.9 kg)    Ideal Body Weight:  89.1 kg  BMI:  Body mass index is 20.64 kg/m.  Estimated Nutritional Needs:   Kcal:  2200-2500  Protein:  90-100 grams  Fluid:  2.2 L/day  EDUCATION NEEDS:   No education needs identified at this time  [name] RD, LDN Inpatient Clinical Dietitian Pager: [phone] After Hours Pager: [phone]

## 2016-01-10 NOTE — Progress Notes (Signed)
Echocardiogram 2D Echocardiogram has been performed.  Jared [name] 01/10/2016, 3:30 PM

## 2016-01-10 NOTE — Progress Notes (Signed)
PROGRESS NOTE    Jared [name]  BRA:[id number] DOB: 05/18/1956 DOA: 01/09/2016 PCP: Doris Cheadle, MD   Brief Narrative:  HPI on 01/09/2016 by Ms. Marlowe Kays, PA Jared [name] is a 60 y.o. male with medical history significant for CABG, hypertension, prior history of stroke without residual, systolic heart failure, current tobacco abuse, chronic headaches, presenting to the emergency department with generalized weakness, and increased shortness of breath, along with  white sputum production. He also reports dyspnea on exertion. In addition, he reports epigastric abdominal pain, which may radiate to the lower portion of his sternum versus chest pain . He denies any lower extremity edema.  Admits to salt rich food ingestion .The symptoms are similar to those of one week ago, at which time, workup was suspicious for acute exacerbation of systolic heart failure. She was started on Cozaar, metoprolol and Lasix 20 mg daily at the time, and was recommended to see cardiology, which he failed to do so. His cardiologist is Dr. Dietrich Pates, last seen in December 2016.the patient continues to smoke 1 pack a day of cigarettes, specific recommendations on the prior week to discontinue its use.He took his BP meds this morning  Assessment & Plan   Acute respiratory failure secondary to CHF exacerbation -O2 sats upon admission 89% -CXR unremarkable for infection -Responded well to lasix -Weaned off of O2  Acute combined systolic/diastolic heart failure -BNP 2056.8 -Echocardiogram 09/20/2014 showed EF 40-45% -Continue IV lasix -Monitor intake/output, daily weights -Urine output over past 24 hours -Sees Dr. Tenny Craw  Abnormal LFTs/Abdominal discomfort -Abd [place] -AST 147, ALT 110 upon admission -Currently trending downward -hepatitis panel unremarkable -Patient denies drug/alcohol use -Per patient, abdominal pain improved  Essential Hypertension -Has not been taking home medications for over a year due to lack  of insurance and monetary funds -Restarted on losartan, metoprolol, IV lasix  CAD -Currently no chest pain -Continue metoprolol, losartan -Will add on aspirin, obtain lipid panel  Tobacco Abuse -Smoking cessation discussed -Continue nicotine paych  DVT Prophylaxis  Lovenox  Code Status: Full  Family Communication: None at bedside  Disposition Plan: Admitted for observation, pending echocardiogram. Continue diuresis   Consultants None  Procedures  Abdominal US  Antibiotics   Anti-infectives    None      Subjective:   [name] seen and examined today.  Feels breathing has mildly improved, but not back to baseline. Denies cough, chest pain, current abdominal pain, nausea/vomiting, constipation/diarrhea, headache, dizziness.  States he was not taking his medications as he could not afford them.  Objective:   Vitals:   01/09/16 1836 01/09/16 2100 01/09/16 2351 01/10/16 0300  BP: (!) 165/90 (!) 164/96 140/87 (!) 149/84  Pulse: 83 74 82 67  Resp: 18 18 18 18   Temp: 98.3 F (36.8 C) 98.7 F (37.1 C) 98.5 F (36.9 C) 98.4 F (36.9 C)  TempSrc: Oral Oral Oral Oral  SpO2: 100% 100% 93% 100%  Weight: 75.4 kg (166 lb 3.2 oz)   74.9 kg (165 lb 1.6 oz)  Height: 6\' 3"  (1.905 m)       Intake/Output Summary (Last 24 hours) at 01/10/16 1052 Last data filed at 01/10/16 0739  Gross per 24 hour  Intake                0 ml  Output             1725 ml  Net            -1725 ml  Filed Weights   01/09/16 1836 01/10/16 0300  Weight: 75.4 kg (166 lb 3.2 oz) 74.9 kg (165 lb 1.6 oz)    Exam  General: Well developed, well nourished, NAD, appears stated age  HEENT: NCAT, PERRLA, EOMI, Anicteic Sclera, mucous membranes moist.   Neck: Supple, no JVD, no masses  Cardiovascular: S1 S2 auscultated, no rubs, murmurs or gallops. Regular rate and rhythm.  Respiratory: Diminished but clear, normal chest expansion  Abdomen: Soft, nontender, nondistended, + bowel  sounds  Extremities: warm dry without cyanosis clubbing or edema  Neuro: AAOx3, nonfocal  Skin: Without rashes exudates or nodules  Psych: Normal affect and demeanor with intact judgement and insight   Data Reviewed: I have personally reviewed following labs and imaging studies  CBC:  Recent Labs Lab 01/09/16 0931 01/10/16 0330  WBC 5.8 7.5  HGB 15.0 14.7  HCT 47.5 47.5  MCV 94.8 95.8  PLT 184 197   Basic Metabolic Panel:  Recent Labs Lab 01/09/16 0931 01/10/16 0330  NA 141 144  K 3.5 3.5  CL 107 104  CO2 29 34*  GLUCOSE 129* 118*  BUN 12 12  CREATININE 1.05 0.99  CALCIUM 8.7* 8.9   GFR: Estimated Creatinine Clearance: 82 mL/min (by C-G formula based on SCr of 0.99 mg/dL). Liver Function Tests:  Recent Labs Lab 01/09/16 1137 01/10/16 0330  AST 147* 68*  ALT 110* 90*  ALKPHOS 168* 162*  BILITOT 0.9 0.6  PROT 6.4* 6.1*  ALBUMIN 3.6 3.4*    Recent Labs Lab 01/09/16 1137  LIPASE 24   No results for input(s): AMMONIA in the last 168 hours. Coagulation Profile: No results for input(s): INR, PROTIME in the last 168 hours. Cardiac Enzymes: No results for input(s): CKTOTAL, CKMB, CKMBINDEX, TROPONINI in the last 168 hours. BNP (last 3 results) No results for input(s): PROBNP in the last 8760 hours. HbA1C: No results for input(s): HGBA1C in the last 72 hours. CBG: No results for input(s): GLUCAP in the last 168 hours. Lipid Profile: No results for input(s): CHOL, HDL, LDLCALC, TRIG, CHOLHDL, LDLDIRECT in the last 72 hours. Thyroid Function Tests: No results for input(s): TSH, T4TOTAL, FREET4, T3FREE, THYROIDAB in the last 72 hours. Anemia Panel: No results for input(s): VITAMINB12, FOLATE, FERRITIN, TIBC, IRON, RETICCTPCT in the last 72 hours. Urine analysis: No results found for: COLORURINE, APPEARANCEUR, LABSPEC, PHURINE, GLUCOSEU, HGBUR, BILIRUBINUR, KETONESUR, PROTEINUR, UROBILINOGEN, NITRITE, LEUKOCYTESUR Sepsis  Labs: @LABRCNTIP (procalcitonin:4,lacticidven:4)  )No results found for this or any previous visit (from the past 240 hour(s)).    Radiology Studies: Dg Chest 2 View  Result Date: 01/09/2016 CLINICAL DATA:  Chest pain for 8 days EXAM: CHEST  2 VIEW COMPARISON:  January 02, 2016 FINDINGS: There are scattered areas of patchy fibrosis, primarily in the bases. There is no edema or consolidation. Heart is borderline enlarged with pulmonary vascularity within normal limits. There is atherosclerotic calcification in the aorta. No adenopathy. There is an old healed fracture of the left clavicle. IMPRESSION: Scattered areas of fibrotic change. No frank edema or consolidation. Stable cardiac prominence. Aortic atherosclerosis. Electronically Signed   By: Bretta Bang III M.D.   On: 01/09/2016 10:22  US Abdomen Limited Ruq  Result Date: 01/09/2016 CLINICAL DATA:  Acute right upper quadrant abdominal pain. EXAM: US ABDOMEN LIMITED - RIGHT UPPER QUADRANT COMPARISON:  None. FINDINGS: Gallbladder: No gallstones or wall thickening visualized. No sonographic Murphy sign noted by sonographer. Common bile duct: Diameter: 2 mm which is within normal limits. Liver: No focal lesion identified.  Within normal limits in parenchymal echogenicity. IMPRESSION: No definite abnormality seen in the right upper quadrant of the abdomen. Electronically Signed   By: Lupita Raider, M.D.   On: 01/09/2016 16:06     Scheduled Meds: . enoxaparin (LOVENOX) injection  40 mg Subcutaneous Q24H  . feeding supplement (ENSURE ENLIVE)  237 mL Oral BID BM  . furosemide  20 mg Intravenous BID  . losartan  50 mg Oral Daily  . metoprolol tartrate  50 mg Oral BID  . nicotine  21 mg Transdermal Daily  . sodium chloride flush  3 mL Intravenous Q12H   Continuous Infusions:    LOS: 0 days   Time Spent in minutes   30 minutes  Shasta Chinn D.O. on 01/10/2016 at 10:52 AM  Between 7am to 7pm - Pager - [phone]  After 7pm go to  [email or web address] - password [email or web address]  And look for the night coverage person covering for me after hours  [facility] Office  [phone]

## 2016-01-10 NOTE — Progress Notes (Signed)
OT Cancellation Note  Patient Details Name: Andin Hafey MRN: [id number] DOB: 1956/05/06   Cancelled Treatment:    Reason Eval/Treat Not Completed: Patient declined, no reason specified. Pt just got dinner and requested for therapist to come back later. Will re-attempt tomorrow if schedule allows.  Nils Pyle, OTR/L Pager: [phone] 01/10/2016, 5:02 PM

## 2016-01-10 NOTE — Progress Notes (Signed)
Patient used the urinal several times throughout the night per patient but didn't use the urinal. He said he used it at least 10 times. Both off-going RN and ongoing RN heavily emphasized that he needs to use the urinal because we are measuring his urine output. He promised to use the urinal

## 2016-01-11 DIAGNOSIS — Z66 Do not resuscitate: Secondary | ICD-10-CM | POA: Diagnosis present

## 2016-01-11 DIAGNOSIS — I509 Heart failure, unspecified: Secondary | ICD-10-CM

## 2016-01-11 DIAGNOSIS — I2510 Atherosclerotic heart disease of native coronary artery without angina pectoris: Secondary | ICD-10-CM | POA: Diagnosis present

## 2016-01-11 DIAGNOSIS — Z8673 Personal history of transient ischemic attack (TIA), and cerebral infarction without residual deficits: Secondary | ICD-10-CM | POA: Diagnosis not present

## 2016-01-11 DIAGNOSIS — I5043 Acute on chronic combined systolic (congestive) and diastolic (congestive) heart failure: Secondary | ICD-10-CM | POA: Diagnosis present

## 2016-01-11 DIAGNOSIS — R0602 Shortness of breath: Secondary | ICD-10-CM | POA: Diagnosis present

## 2016-01-11 DIAGNOSIS — I10 Essential (primary) hypertension: Secondary | ICD-10-CM | POA: Diagnosis not present

## 2016-01-11 DIAGNOSIS — F17210 Nicotine dependence, cigarettes, uncomplicated: Secondary | ICD-10-CM | POA: Diagnosis present

## 2016-01-11 DIAGNOSIS — J9601 Acute respiratory failure with hypoxia: Secondary | ICD-10-CM | POA: Diagnosis not present

## 2016-01-11 DIAGNOSIS — Z8249 Family history of ischemic heart disease and other diseases of the circulatory system: Secondary | ICD-10-CM | POA: Diagnosis not present

## 2016-01-11 DIAGNOSIS — Z6820 Body mass index (BMI) 20.0-20.9, adult: Secondary | ICD-10-CM | POA: Diagnosis not present

## 2016-01-11 DIAGNOSIS — Z833 Family history of diabetes mellitus: Secondary | ICD-10-CM | POA: Diagnosis not present

## 2016-01-11 DIAGNOSIS — E440 Moderate protein-calorie malnutrition: Secondary | ICD-10-CM | POA: Diagnosis present

## 2016-01-11 DIAGNOSIS — I5023 Acute on chronic systolic (congestive) heart failure: Secondary | ICD-10-CM | POA: Diagnosis not present

## 2016-01-11 DIAGNOSIS — I110 Hypertensive heart disease with heart failure: Secondary | ICD-10-CM | POA: Diagnosis present

## 2016-01-11 DIAGNOSIS — J9600 Acute respiratory failure, unspecified whether with hypoxia or hypercapnia: Secondary | ICD-10-CM | POA: Diagnosis present

## 2016-01-11 DIAGNOSIS — J449 Chronic obstructive pulmonary disease, unspecified: Secondary | ICD-10-CM | POA: Diagnosis present

## 2016-01-11 LAB — BASIC METABOLIC PANEL
Anion gap: 5 (ref 5–15)
BUN: 10 mg/dL (ref 6–20)
CO2: 40 mmol/L — ABNORMAL HIGH (ref 22–32)
Calcium: 8.8 mg/dL — ABNORMAL LOW (ref 8.9–10.3)
Chloride: 95 mmol/L — ABNORMAL LOW (ref 101–111)
Creatinine, Ser: 1.09 mg/dL (ref 0.61–1.24)
GFR calc Af Amer: >60 mL/min (ref >60)
GFR calc non Af Amer: >60 mL/min (ref >60)
Glucose, Bld: 104 mg/dL — ABNORMAL HIGH (ref 65–99)
Potassium: 4.2 mmol/L (ref 3.5–5.1)
Sodium: 140 mmol/L (ref 135–145)

## 2016-01-11 LAB — CBC
HCT: 47.6 % (ref 39.0–52.0)
Hemoglobin: 14.7 g/dL (ref 13.0–17.0)
MCH: 29.9 pg (ref 26.0–34.0)
MCHC: 30.9 g/dL (ref 30.0–36.0)
MCV: 96.7 fL (ref 78.0–100.0)
Platelets: 200 10*3/uL (ref 150–400)
RBC: 4.92 MIL/uL (ref 4.22–5.81)
RDW: 14.5 % (ref 11.5–15.5)
WBC: 6.9 10*3/uL (ref 4.0–10.5)

## 2016-01-11 LAB — LIPID PANEL
Cholesterol: 147 mg/dL (ref 0–200)
HDL: 53 mg/dL (ref >40)
LDL Cholesterol: 80 mg/dL (ref 0–99)
Total CHOL/HDL Ratio: 2.8 RATIO
Triglycerides: 69 mg/dL (ref <150)
VLDL: 14 mg/dL (ref 0–40)

## 2016-01-11 MED ORDER — FUROSEMIDE 10 MG/ML IJ SOLN
20.0000 mg | Freq: Every day | INTRAMUSCULAR | Status: DC
  Administered 2016-01-12: 20 mg via INTRAVENOUS
  Filled 2016-01-11: qty 2

## 2016-01-11 NOTE — Progress Notes (Signed)
PROGRESS NOTE    Jared [name]  WUJ:[id number] DOB: May 27, 1956 DOA: 01/09/2016 PCP: Doris Cheadle, MD   Brief Narrative:  HPI on 01/09/2016 by Ms. Marlowe Kays, PA Jared [name] is a 60 y.o. male with medical history significant for CABG, hypertension, prior history of stroke without residual, systolic heart failure, current tobacco abuse, chronic headaches, presenting to the emergency department with generalized weakness, and increased shortness of breath, along with  white sputum production. He also reports dyspnea on exertion. In addition, he reports epigastric abdominal pain, which may radiate to the lower portion of his sternum versus chest pain . He denies any lower extremity edema.  Admits to salt rich food ingestion .The symptoms are similar to those of one week ago, at which time, workup was suspicious for acute exacerbation of systolic heart failure. She was started on Cozaar, metoprolol and Lasix 20 mg daily at the time, and was recommended to see cardiology, which he failed to do so. His cardiologist is Dr. Dietrich Pates, last seen in December 2016.the patient continues to smoke 1 pack a day of cigarettes, specific recommendations on the prior week to discontinue its use.He took his BP meds this morning  Assessment & Plan   Acute respiratory failure secondary to CHF exacerbation -O2 sats upon admission 89% -CXR unremarkable for infection -Responded well to lasix -Weaned off of O2-SpO2 90% on room air  Acute combined systolic/diastolic heart failure -BNP 2056.8 -Echocardiogram 09/20/2014 showed EF 40-45% -Echocardiogram 01/10/2016: EF 35-40%, diastolic dysfunction -Continue IV lasix- but will decrease (due to elevated CO2 level) -Monitor intake/output, daily weights -Urine output over past 24 hours: 1600cc -Sees Dr. Tenny Craw  Abnormal LFTs/Abdominal discomfort -Abd [place] -AST 147, ALT 110 upon admission -Currently trending downward -hepatitis panel unremarkable -Patient denies drug/alcohol  use -Per patient, abdominal pain improved  Essential Hypertension -Has not been taking home medications for over a year due to lack of insurance and monetary funds -Restarted on losartan, metoprolol, IV lasix  CAD -Currently no chest pain -Continue metoprolol, losartan -LDL 80  Tobacco Abuse -Smoking cessation discussed -Continue nicotine patch  Moderate malnutrition -Nutrition consult  DVT Prophylaxis  Lovenox  Code Status: Full  Family Communication: None at bedside  Disposition Plan: Admitted for observation. Continue diuresis   Consultants None  Procedures  Abdominal US Echocardiogram  Antibiotics   Anti-infectives    None      Subjective:   Jared [name] seen and examined today.  Feels breathing has mildly improved, but not back to baseline. Complains of headache. Denies cough, chest pain, current abdominal pain, nausea/vomiting, constipation/diarrhea, dizziness.    Objective:   Vitals:   01/11/16 0434 01/11/16 0838 01/11/16 1059 01/11/16 1232  BP: (!) 150/90 (!) 136/93  135/90  Pulse: 79 (!) 57 73 75  Resp: 18   20  Temp: 99.6 F (37.6 C)   99.4 F (37.4 C)  TempSrc: Oral   Oral  SpO2: 98% 97%  90%  Weight: 75.3 kg (166 lb)     Height:        Intake/Output Summary (Last 24 hours) at 01/11/16 1233 Last data filed at 01/11/16 1233  Gross per 24 hour  Intake              560 ml  Output             2200 ml  Net            -1640 ml   Filed Weights   01/09/16 1836 01/10/16 0300 01/11/16 0434  Weight: 75.4 kg (166 lb 3.2 oz) 74.9 kg (165 lb 1.6 oz) 75.3 kg (166 lb)    Exam  General: Well developed, well nourished, NAD, appears stated age  HEENT: NCAT,  mucous membranes moist.   Cardiovascular: S1 S2 auscultated, RRR, no murmurs  Respiratory: Diminished but clear, normal chest expansion  Abdomen: Soft, nontender, nondistended, + bowel sounds  Extremities: warm dry without cyanosis clubbing. Trace LE edema  Neuro: AAOx3,  nonfocal  Psych: Normal affect and demeanor with intact judgement and insight   Data Reviewed: I have personally reviewed following labs and imaging studies  CBC:  Recent Labs Lab 01/09/16 0931 01/10/16 0330 01/11/16 0446  WBC 5.8 7.5 6.9  HGB 15.0 14.7 14.7  HCT 47.5 47.5 47.6  MCV 94.8 95.8 96.7  PLT 184 197 200   Basic Metabolic Panel:  Recent Labs Lab 01/09/16 0931 01/10/16 0330 01/11/16 0446  NA 141 144 140  K 3.5 3.5 4.2  CL 107 104 95*  CO2 29 34* 40*  GLUCOSE 129* 118* 104*  BUN CREATININE 1.05 0.99 1.09  CALCIUM 8.7* 8.9 8.8*   GFR: Estimated Creatinine Clearance: 74.8 mL/min (by C-G formula based on SCr of 1.09 mg/dL). Liver Function Tests:  Recent Labs Lab 01/09/16 1137 01/10/16 0330  AST 147* 68*  ALT 110* 90*  ALKPHOS 168* 162*  BILITOT 0.9 0.6  PROT 6.4* 6.1*  ALBUMIN 3.6 3.4*    Recent Labs Lab 01/09/16 1137  LIPASE 24   No results for input(s): AMMONIA in the last 168 hours. Coagulation Profile: No results for input(s): INR, PROTIME in the last 168 hours. Cardiac Enzymes: No results for input(s): CKTOTAL, CKMB, CKMBINDEX, TROPONINI in the last 168 hours. BNP (last 3 results) No results for input(s): PROBNP in the last 8760 hours. HbA1C: No results for input(s): HGBA1C in the last 72 hours. CBG: No results for input(s): GLUCAP in the last 168 hours. Lipid Profile:  Recent Labs  01/11/16 0446  CHOL 147  HDL 53  LDLCALC 80  TRIG 69  CHOLHDL 2.8   Thyroid Function Tests: No results for input(s): TSH, T4TOTAL, FREET4, T3FREE, THYROIDAB in the last 72 hours. Anemia Panel: No results for input(s): VITAMINB12, FOLATE, FERRITIN, TIBC, IRON, RETICCTPCT in the last 72 hours. Urine analysis: No results found for: COLORURINE, APPEARANCEUR, LABSPEC, PHURINE, GLUCOSEU, HGBUR, BILIRUBINUR, KETONESUR, PROTEINUR, UROBILINOGEN, NITRITE, LEUKOCYTESUR Sepsis Labs: (procalcitonin:4,lacticidven:4)  )No results found  for this or any previous visit (from the past 240 hour(s)).    Radiology Studies: US Abdomen Limited Ruq  Result Date: 01/09/2016 CLINICAL DATA:  Acute right upper quadrant abdominal pain. EXAM: US ABDOMEN LIMITED - RIGHT UPPER QUADRANT COMPARISON:  None. FINDINGS: Gallbladder: No gallstones or wall thickening visualized. No sonographic Murphy sign noted by sonographer. Common bile duct: Diameter: 2 mm which is within normal limits. Liver: No focal lesion identified. Within normal limits in parenchymal echogenicity. IMPRESSION: No definite abnormality seen in the right upper quadrant of the abdomen. Electronically Signed   By: Lupita Raider, M.D.   On: 01/09/2016 16:06     Scheduled Meds: . aspirin EC  81 mg Oral Daily  . enoxaparin (LOVENOX) injection  40 mg Subcutaneous Q24H  . furosemide  20 mg Intravenous BID  . losartan  50 mg Oral Daily  . metoprolol tartrate  50 mg Oral BID  . nicotine  21 mg Transdermal Daily  . sodium chloride flush  3 mL Intravenous Q12H   Continuous Infusions:  LOS: 0 days   Time Spent in minutes   30 minutes  Jared [name] D.O. on 01/11/2016 at 12:33 PM  Between 7am to 7pm - Pager - [phone]  After 7pm go to [email or web address] - password [email or web address]  And look for the night coverage person covering for me after hours  [facility] Office  [phone]

## 2016-01-11 NOTE — Care Management Note (Signed)
Case Management Note  Patient Details  Name: Jared [name] MRN: [id number] Date of Birth: 08/03/1955  Subjective/Objective:     Admitted with Chest Pain               Action/Plan: Patient lives alone, independent of his ADL's; works as a Administrator; has private insurance with [employer] with prescription drug coverage; pharmacy [facility] choice [facility] but he also goes to the [facility] for primary care and medication. Long discussion with patient ( not taking his medication due to cost); he stated that he will be more compliant with his medication.  Expected Discharge Date:     Possibly 01/12/2016             Expected Discharge Plan:  Home/Self Care  Discharge planning Services  CM Consult    Status of Service:  In process, will continue to follow  Reola Mosher [phone] 01/11/2016, 10:19 AM

## 2016-01-11 NOTE — Progress Notes (Signed)
Pt slept pretty well until 4am complained headache, Norco 1 tab provided, vitals stable, no any complain of SOB and distress overnight, will continue to monitor the patient.

## 2016-01-11 NOTE — Evaluation (Signed)
Occupational Therapy Evaluation Patient Details Name: Jared [name] MRN: [id number] DOB: 22-Apr-1956 Today's Date: 01/11/2016    History of Present Illness 60 y.o. male with medical history significant for CABG, hypertension, prior history of stroke without residual, systolic heart failure, current tobacco abuse, chronic headaches, presenting to the emergency department with generalized weakness, and increased shortness of breath, along with  white sputum production. He also reports dyspnea on exertion. In addition, he reports epigastric abdominal pain, which may radiate to the lower portion of his sternum versus chest pain . He denies any lower extremity edema.  Admits to salt rich food ingestion .The symptoms are similar to those of one week ago, at which time, workup was suspicious for acute exacerbation of systolic heart failure. He was started on Cozaar, metoprolol and Lasix 20 mg daily at the time, and was recommended to see cardiology, which he failed to do so.    Clinical Impression   Pt admitted with above and present to Occupational therapy with decreased cardiorespiratory endurance.  Pt completed functional mobility with >150 feet without AD at Mod I level.  Educated on energy conservation strategies to increase endurance during ADLs and mobility.  Pt's O2 sats dropped to 77% on room air, but returned to 88% in ~2 mins with cues for pursed lip breathing.  Pt will not require OT follow up at this time.  May benefit from PT consult to further address balance and endurance.  OT will sign off at this time.    Follow Up Recommendations  No OT follow up    Equipment Recommendations  None recommended by OT    Recommendations for Other Services PT consult     Precautions / Restrictions Precautions Precautions: Fall      Mobility Bed Mobility Overal bed mobility: Independent                Transfers Overall transfer level: Independent Equipment used: None                   Balance                                            ADL Overall ADL's : At baseline;Modified independent                                             Vision Vision Assessment?: No apparent visual deficits   Perception     Praxis      Pertinent Vitals/Pain Pain Assessment: No/denies pain     Hand Dominance Right   Extremity/Trunk Assessment Upper Extremity Assessment Upper Extremity Assessment: Overall WFL for tasks assessed   Lower Extremity Assessment Lower Extremity Assessment: Overall WFL for tasks assessed       Communication Communication Communication: No difficulties   Cognition Arousal/Alertness: Awake/alert Behavior During Therapy: WFL for tasks assessed/performed Overall Cognitive Status: Within Functional Limits for tasks assessed                     General Comments       Exercises       Shoulder Instructions      Home Living Family/patient expects to be discharged to:: Private residence Living Arrangements: Alone Available Help at Discharge: Family;Available PRN/intermittently Type of Home:  House Home Access: Stairs to enter [employer] of Steps: 3 Entrance Stairs-Rails: Can reach both Home Layout: One level     Bathroom Shower/Tub: Tub/shower unit Shower/tub characteristics: Engineer, building services: Standard     Home Equipment: None          Prior Functioning/Environment Level of Independence: Independent             OT Diagnosis: Other (comment);Generalized weakness (cardiopulmonary)   OT Problem List: Cardiopulmonary status limiting activity;Decreased activity tolerance   OT Treatment/Interventions:      OT Goals(Current goals can be found in the care plan section) Acute Rehab OT Goals OT Goal Formulation: All assessment and education complete, DC therapy  OT Frequency:     Barriers to D/C:            Co-evaluation              End of Session  Equipment Utilized During Treatment: Gait belt  Activity Tolerance: Patient tolerated treatment well Patient left: in bed;with call bell/phone within reach   Time: 1413-1435 OT Time Calculation (min): 22 min Charges:  OT General Charges $OT Visit: 1 Procedure OT Evaluation $OT Eval Low Complexity: 1 Procedure G-CodesRosalio Loud, [id number] 01/11/2016, 2:55 PM

## 2016-01-12 DIAGNOSIS — E440 Moderate protein-calorie malnutrition: Secondary | ICD-10-CM

## 2016-01-12 DIAGNOSIS — I5023 Acute on chronic systolic (congestive) heart failure: Secondary | ICD-10-CM

## 2016-01-12 DIAGNOSIS — I10 Essential (primary) hypertension: Secondary | ICD-10-CM

## 2016-01-12 DIAGNOSIS — Z720 Tobacco use: Secondary | ICD-10-CM

## 2016-01-12 DIAGNOSIS — J9601 Acute respiratory failure with hypoxia: Secondary | ICD-10-CM

## 2016-01-12 DIAGNOSIS — I2510 Atherosclerotic heart disease of native coronary artery without angina pectoris: Secondary | ICD-10-CM

## 2016-01-12 DIAGNOSIS — I5022 Chronic systolic (congestive) heart failure: Secondary | ICD-10-CM

## 2016-01-12 LAB — COMPREHENSIVE METABOLIC PANEL
ALT: 53 U/L (ref 17–63)
AST: 30 U/L (ref 15–41)
Albumin: 3.2 g/dL — ABNORMAL LOW (ref 3.5–5.0)
Alkaline Phosphatase: 124 U/L (ref 38–126)
Anion gap: 5 (ref 5–15)
BUN: 9 mg/dL (ref 6–20)
CO2: 38 mmol/L — ABNORMAL HIGH (ref 22–32)
Calcium: 8.8 mg/dL — ABNORMAL LOW (ref 8.9–10.3)
Chloride: 96 mmol/L — ABNORMAL LOW (ref 101–111)
Creatinine, Ser: 0.98 mg/dL (ref 0.61–1.24)
GFR calc Af Amer: >60 mL/min (ref >60)
GFR calc non Af Amer: >60 mL/min (ref >60)
Glucose, Bld: 105 mg/dL — ABNORMAL HIGH (ref 65–99)
Potassium: 3.5 mmol/L (ref 3.5–5.1)
Sodium: 139 mmol/L (ref 135–145)
Total Bilirubin: 0.7 mg/dL (ref 0.3–1.2)
Total Protein: 5.8 g/dL — ABNORMAL LOW (ref 6.5–8.1)

## 2016-01-12 LAB — CBC
HCT: 47.5 % (ref 39.0–52.0)
Hemoglobin: 14.8 g/dL (ref 13.0–17.0)
MCH: 29.4 pg (ref 26.0–34.0)
MCHC: 31.2 g/dL (ref 30.0–36.0)
MCV: 94.4 fL (ref 78.0–100.0)
Platelets: 187 10*3/uL (ref 150–400)
RBC: 5.03 MIL/uL (ref 4.22–5.81)
RDW: 14.3 % (ref 11.5–15.5)
WBC: 5.6 10*3/uL (ref 4.0–10.5)

## 2016-01-12 MED ORDER — FUROSEMIDE 20 MG PO TABS: 20.0000 mg | ORAL_TABLET | Freq: Every day | ORAL | Status: DC

## 2016-01-12 MED ORDER — CARVEDILOL 3.125 MG PO TABS
3.1250 mg | ORAL_TABLET | Freq: Two times a day (BID) | ORAL | Status: DC
  Administered 2016-01-12 – 2016-01-13 (×2): 3.125 mg via ORAL
  Filled 2016-01-12 (×2): qty 1

## 2016-01-12 MED ORDER — FUROSEMIDE 20 MG PO TABS
20.0000 mg | ORAL_TABLET | Freq: Every day | ORAL | Status: DC
  Administered 2016-01-13: 20 mg via ORAL
  Filled 2016-01-12: qty 1

## 2016-01-12 NOTE — Progress Notes (Signed)
PROGRESS NOTE  Jared [name] ZOX:[id number] DOB: 06-02-1956 DOA: 01/09/2016 PCP: Doris Cheadle, MD  Brief History:  60 y.o.malewith medical history significant for CABG, hypertension, prior history of stroke without residual, systolic heart failure, current tobacco abuse, chronic headaches, presenting to the emergency department with generalized weakness, and increased shortness of breath, along with white sputum production. He also reports dyspnea on exertion. In addition, he reports epigastric abdominal pain, which may radiate to the lower portion of his sternum versus chest pain . He denies any lower extremity edema. Admits to salt rich food ingestion .The symptoms are similar to those of one week ago, at which time, workup was suspicious for acute exacerbation of systolic heart failure. She was started on Cozaar, metoprolol and Lasix 20 mg daily at the time, and was recommended to see cardiology, which he failed to do so. His cardiologist is Dr. Dietrich Pates, last seen in December 2016.the patient continues to smoke 1 pack a day of cigarettes  Assessment/Plan: Acute respiratory failure secondary to CHF exacerbation -O2 sats upon admission 89% -CXR unremarkable for infection -Responded well to IV lasix -Weaned off of O2-SpO2 95% on room air  Acute combined systolic/diastolic heart failure -BNP 2056.8 -Echocardiogram 09/20/2014 showed EF 40-45% -Transition to by mouth Lasix -Echocardiogram 01/10/2016: EF 35-40%, diastolic dysfunction --AST 147, ALT 110 upon admission -Currently trending downward -hepatitis panel unremarkable -Patient denies drug/alcohol use -switch metoprolol-->coreg  Essential Hypertension -Has not been taking home medications for over a year due to lack of insurance and monetary funds -Restarted on losartan,  Lasix --switch metoprolol-->coreg   CAD -Currently no chest pain -Continue coreg, losartan, ASA 81 mg daily -LDL 80  Tobacco Abuse -Smoking  cessation discussed -Continue nicotine patch  Moderate malnutrition -Nutrition consult     Disposition Plan:   Home 8/4 if stable Family Communication:  No Family at bedside  Consultants:  none  Code Status:  FULL  DVT Prophylaxis:  [place] Lovenox   Procedures: As Listed in Progress Note Above  Antibiotics: None    Subjective: Patient denies fevers, chills, headache, chest pain, dyspnea, nausea, vomiting, diarrhea, abdominal pain, dysuria, hematuria, hematochezia, and melena.   Objective: Vitals:   01/11/16 1731 01/11/16 1957 01/12/16 0514 01/12/16 1440  BP:  127/79 (!) 153/80 101/71  Pulse:  66 65 65  Resp:  Temp:  98.7 F (37.1 C) 98.5 F (36.9 C) 99.1 F (37.3 C)  TempSrc:  Oral Oral Oral  SpO2: 94% 91% 92% 95%  Weight:   74.8 kg (165 lb)   Height:        Intake/Output Summary (Last 24 hours) at 01/12/16 1827 Last data filed at 01/12/16 1803  Gross per 24 hour  Intake             1160 ml  Output              350 ml  Net              810 ml   Weight change: -0.454 kg (-1 lb) Exam:   General:  Pt is alert, follows commands appropriately, not in acute distress  HEENT: No icterus, No thrush, No neck mass, [place]/AT  Cardiovascular: RRR, S1/S2, no rubs, no gallops  Respiratory: CTA bilaterally, no wheezing, no crackles, no rhonchi  Abdomen: Soft/+BS, non tender, non distended, no guarding  Extremities: No edema, No lymphangitis, No petechiae, No rashes, no synovitis   Data Reviewed: I  have personally reviewed following labs and imaging studies Basic Metabolic Panel:  Recent Labs Lab 01/09/16 0931 01/10/16 0330 01/11/16 0446 01/12/16 0453  NA 141 144 140 139  K 3.5 3.5 4.2 3.5  CL 107 104 95* 96*  CO2 29 34* 40* 38*  GLUCOSE 129* 118* 104* 105*  BUN 12 12 10 9   CREATININE 1.05 0.99 1.09 0.98  CALCIUM 8.7* 8.9 8.8* 8.8*   Liver Function Tests:  Recent Labs Lab 01/09/16 1137 01/10/16 0330 01/12/16 0453  AST 147* 68* 30    ALT 110* 90* 53  ALKPHOS 168* 162* 124  BILITOT 0.9 0.6 0.7  PROT 6.4* 6.1* 5.8*  ALBUMIN 3.6 3.4* 3.2*    Recent Labs Lab 01/09/16 1137  LIPASE 24   No results for input(s): AMMONIA in the last 168 hours. Coagulation Profile: No results for input(s): INR, PROTIME in the last 168 hours. CBC:  Recent Labs Lab 01/09/16 0931 01/10/16 0330 01/11/16 0446 01/12/16 0453  WBC 5.8 7.5 6.9 5.6  HGB 15.0 14.7 14.7 14.8  HCT 47.5 47.5 47.6 47.5  MCV 94.8 95.8 96.7 94.4  PLT 184 197 200 187   Cardiac Enzymes: No results for input(s): CKTOTAL, CKMB, CKMBINDEX, TROPONINI in the last 168 hours. BNP: Invalid input(s): POCBNP CBG: No results for input(s): GLUCAP in the last 168 hours. HbA1C: No results for input(s): HGBA1C in the last 72 hours. Urine analysis: No results found for: COLORURINE, APPEARANCEUR, LABSPEC, PHURINE, GLUCOSEU, HGBUR, BILIRUBINUR, KETONESUR, PROTEINUR, UROBILINOGEN, NITRITE, LEUKOCYTESUR Sepsis Labs: @LABRCNTIP (procalcitonin:4,lacticidven:4) )No results found for this or any previous visit (from the past 240 hour(s)).   Scheduled Meds: . aspirin EC  81 mg Oral Daily  . carvedilol  3.125 mg Oral BID WC  . enoxaparin (LOVENOX) injection  40 mg Subcutaneous Q24H  . [START ON 01/13/2016] furosemide  20 mg Oral Daily  . losartan  50 mg Oral Daily  . nicotine  21 mg Transdermal Daily  . sodium chloride flush  3 mL Intravenous Q12H   Continuous Infusions:   Procedures/Studies: Dg Chest 2 View  Result Date: 01/09/2016 CLINICAL DATA:  Chest pain for 8 days EXAM: CHEST  2 VIEW COMPARISON:  January 02, 2016 FINDINGS: There are scattered areas of patchy fibrosis, primarily in the bases. There is no edema or consolidation. Heart is borderline enlarged with pulmonary vascularity within normal limits. There is atherosclerotic calcification in the aorta. No adenopathy. There is an old healed fracture of the left clavicle. IMPRESSION: Scattered areas of fibrotic change. No  frank edema or consolidation. Stable cardiac prominence. Aortic atherosclerosis. Electronically Signed   By: Bretta Bang III M.D.   On: 01/09/2016 10:22  Dg Chest 2 View  Result Date: 01/02/2016 CLINICAL DATA:  Shortness of breath since Friday. Cough for a month. History of COPD and chest congestion. EXAM: CHEST  2 VIEW COMPARISON:  08/18/2015; 06/13/2015; 10/03/2013 FINDINGS: Grossly unchanged cardiac silhouette and mediastinal contours. Atherosclerotic plaque within the thoracic aorta. The lungs remain hyperexpanded with flattening of the bilateral hemidiaphragms. Interval development of a suspected trace right-sided pleural effusion with small amount of fluid tracking within the right major and minor fissures. Query mild cephalization of flow. No focal airspace opacities. No pneumothorax. No acute osseus abnormalities. IMPRESSION: 1. Suspected mild pulmonary edema superimposed on advanced emphysematous change with trace right-sided pleural effusion. 2.  Emphysema. (ICD10-J43.9) 3.  Aortic Atherosclerosis (ICD10-170.0) Electronically Signed   By: Simonne Come M.D.   On: 01/02/2016 01:38  US Abdomen Limited Ruq  Result Date:  01/09/2016 CLINICAL DATA:  Acute right upper quadrant abdominal pain. EXAM: US ABDOMEN LIMITED - RIGHT UPPER QUADRANT COMPARISON:  None. FINDINGS: Gallbladder: No gallstones or wall thickening visualized. No sonographic Murphy sign noted by sonographer. Common bile duct: Diameter: 2 mm which is within normal limits. Liver: No focal lesion identified. Within normal limits in parenchymal echogenicity. IMPRESSION: No definite abnormality seen in the right upper quadrant of the abdomen. Electronically Signed   By: Lupita Raider, M.D.   On: 01/09/2016 16:06    Anvi Mangal, DO  [facility] Pager [phone]  If 7PM-7AM, please contact night-coverage [email or web address] Password [email or web address] 01/12/2016, 6:27 PM   LOS: 1 day

## 2016-01-13 LAB — BASIC METABOLIC PANEL
Anion gap: 7 (ref 5–15)
BUN: 11 mg/dL (ref 6–20)
CO2: 37 mmol/L — ABNORMAL HIGH (ref 22–32)
Calcium: 9.1 mg/dL (ref 8.9–10.3)
Chloride: 96 mmol/L — ABNORMAL LOW (ref 101–111)
Creatinine, Ser: 0.94 mg/dL (ref 0.61–1.24)
GFR calc Af Amer: >60 mL/min (ref >60)
GFR calc non Af Amer: >60 mL/min (ref >60)
Glucose, Bld: 103 mg/dL — ABNORMAL HIGH (ref 65–99)
Potassium: 3.5 mmol/L (ref 3.5–5.1)
Sodium: 140 mmol/L (ref 135–145)

## 2016-01-13 LAB — MAGNESIUM: Magnesium: 1.9 mg/dL (ref 1.7–2.4)

## 2016-01-13 MED ORDER — ASPIRIN 81 MG PO TBEC: 81.0000 mg | DELAYED_RELEASE_TABLET | Freq: Every day | ORAL | 1 refills | Status: DC

## 2016-01-13 MED ORDER — CARVEDILOL 6.25 MG PO TABS
6.2500 mg | ORAL_TABLET | Freq: Two times a day (BID) | ORAL | 1 refills | Status: DC
Start: 2016-01-13 — End: 2016-01-16

## 2016-01-13 MED ORDER — POTASSIUM CHLORIDE CRYS ER 20 MEQ PO TBCR: 40.0000 meq | EXTENDED_RELEASE_TABLET | Freq: Once | ORAL | Status: DC

## 2016-01-13 MED ORDER — LOSARTAN POTASSIUM 50 MG PO TABS: 50.0000 mg | ORAL_TABLET | Freq: Every day | ORAL | 1 refills | Status: DC

## 2016-01-13 MED ORDER — FUROSEMIDE 20 MG PO TABS
20.0000 mg | ORAL_TABLET | Freq: Every day | ORAL | 1 refills | Status: DC
Start: 2016-01-13 — End: 2016-01-16

## 2016-01-13 MED ORDER — CARVEDILOL 6.25 MG PO TABS: 6.2500 mg | ORAL_TABLET | Freq: Two times a day (BID) | ORAL | Status: DC

## 2016-01-13 MED FILL — LOSARTAN POTASSIUM 50 MG TA: 50 | 30 days supply | Qty: 30 | Fill #0

## 2016-01-13 MED FILL — CARVEDILOL 6.25 MG TABLET: 6.25 | 30 days supply | Qty: 60 | Fill #0

## 2016-01-13 MED FILL — FUROSEMIDE 20 MG TABLET: 20 | 30 days supply | Qty: 30 | Fill #0

## 2016-01-13 NOTE — Progress Notes (Signed)
Pt has orders to be discharged. Discharge instructions given and pt has no additional questions at this time. Medication regimen reviewed and pt educated. Pt verbalized understanding and has no additional questions. Telemetry box removed. IV removed and site in good condition. Pt stable and waiting for transportation.   Emillio Ngo RN 

## 2016-01-13 NOTE — Discharge Summary (Signed)
Physician Discharge Summary  [place] CWC:[id number] DOB: 26-Mar-1956 DOA: 01/09/2016  PCP: Doris Cheadle, MD  Admit date: 01/09/2016 Discharge date: 01/13/2016  Admitted From: Home Disposition:  Home Recommendations for Outpatient Follow-up:  1. Follow up with PCP in 1-2 weeks 2. Please obtain BMP/CBC in one week   Home Health:NO Equipment/Devices: NONE  Discharge Condition:Stable CODE STATUS:FULL Diet recommendation: Heart Healthy   Brief/Interim Summary: 60 y.o.malewith medical history significant for CABG, hypertension, prior history of stroke without residual, systolic heart failure, current tobacco abuse, chronic headaches, presenting to the emergency department with generalized weakness, and increased shortness of breath, along with white sputum production. He also reports dyspnea on exertion. In addition, he reports epigastric abdominal pain, which may radiate to the lower portion of his sternum versus chest pain . He denies any lower extremity edema. Admits to salt rich food ingestion .The symptoms are similar to those of one week ago, at which time, workup was suspicious for acute exacerbation of systolic heart failure. She was started on Cozaar, metoprolol and Lasix 20 mg daily at the time, and was recommended to see cardiology, which he failed to do so. His cardiologist is Dr. Dietrich Pates, last seen in December 2016.the patient continues to smoke 1 pack a day of cigarettes  Discharge Diagnoses:  Acute respiratory failure secondary to CHF exacerbation -O2 sats upon admission 89% -CXR unremarkable for infection -Responded well to IV lasix-->po lasix -Weaned off of O2-SpO2 95% on room air  Acute combined systolic/diastolic heart failure -BNP 2056.8 -Echocardiogram 09/20/2014 showed EF 40-45% -Transition to by mouth Lasix 20 mg daily -Echocardiogram 01/10/2016: EF 35-40%, diastolic dysfunction --AST 147, ALT 110 upon admission -Currently trending downward -hepatitis  panel unremarkable -Patient denies drug/alcohol use -switch metoprolol-->coreg -continue losartan  Essential Hypertension -Has not been taking home medications for over a year due to lack of insurance and monetary funds -Restarted on losartan,  Lasix --switch metoprolol-->coreg 6.25 mg bid  CAD -Currently no chest pain -Continue coreg, losartan, ASA 81 mg daily -LDL 80  Tobacco Abuse -Smoking cessation discussed -Continue nicotine patch  Moderate malnutrition -Nutrition consult    Discharge Instructions  Discharge Instructions    Diet - low sodium heart healthy    Complete by:  As directed   Increase activity slowly    Complete by:  As directed       Medication List    STOP taking these medications   metoprolol tartrate 25 MG tablet Commonly known as:  LOPRESSOR     TAKE these medications   aspirin 81 MG EC tablet Take 1 tablet (81 mg total) by mouth daily.   carvedilol 6.25 MG tablet Commonly known as:  COREG Take 1 tablet (6.25 mg total) by mouth 2 (two) times daily with a meal.   furosemide 20 MG tablet Commonly known as:  LASIX Take 1 tablet (20 mg total) by mouth daily.   losartan 50 MG tablet Commonly known as:  COZAAR Take 1 tablet (50 mg total) by mouth daily.       Allergies  Allergen Reactions  . Lisinopril Cough    Consultations:  none   Procedures/Studies: Dg Chest 2 View  Result Date: 01/09/2016 CLINICAL DATA:  Chest pain for 8 days EXAM: CHEST  2 VIEW COMPARISON:  January 02, 2016 FINDINGS: There are scattered areas of patchy fibrosis, primarily in the bases. There is no edema or consolidation. Heart is borderline enlarged with pulmonary vascularity within normal limits. There is atherosclerotic calcification in the aorta. No adenopathy.  There is an old healed fracture of the left clavicle. IMPRESSION: Scattered areas of fibrotic change. No frank edema or consolidation. Stable cardiac prominence. Aortic atherosclerosis.  Electronically Signed   By: Bretta Bang III M.D.   On: 01/09/2016 10:22  Dg Chest 2 View  Result Date: 01/02/2016 CLINICAL DATA:  Shortness of breath since Friday. Cough for a month. History of COPD and chest congestion. EXAM: CHEST  2 VIEW COMPARISON:  08/18/2015; 06/13/2015; 10/03/2013 FINDINGS: Grossly unchanged cardiac silhouette and mediastinal contours. Atherosclerotic plaque within the thoracic aorta. The lungs remain hyperexpanded with flattening of the bilateral hemidiaphragms. Interval development of a suspected trace right-sided pleural effusion with small amount of fluid tracking within the right major and minor fissures. Query mild cephalization of flow. No focal airspace opacities. No pneumothorax. No acute osseus abnormalities. IMPRESSION: 1. Suspected mild pulmonary edema superimposed on advanced emphysematous change with trace right-sided pleural effusion. 2.  Emphysema. (ICD10-J43.9) 3.  Aortic Atherosclerosis (ICD10-170.0) Electronically Signed   By: Simonne Come M.D.   On: 01/02/2016 01:38  US Abdomen Limited Ruq  Result Date: 01/09/2016 CLINICAL DATA:  Acute right upper quadrant abdominal pain. EXAM: US ABDOMEN LIMITED - RIGHT UPPER QUADRANT COMPARISON:  None. FINDINGS: Gallbladder: No gallstones or wall thickening visualized. No sonographic Murphy sign noted by sonographer. Common bile duct: Diameter: 2 mm which is within normal limits. Liver: No focal lesion identified. Within normal limits in parenchymal echogenicity. IMPRESSION: No definite abnormality seen in the right upper quadrant of the abdomen. Electronically Signed   By: Lupita Raider, M.D.   On: 01/09/2016 16:06        Discharge Exam: Vitals:   01/13/16 0102 01/13/16 0403  BP: (!) 145/93 (!) 152/95  Pulse: (!) 56 66  Resp:  18  Temp:  98 F (36.7 C)   Vitals:   01/12/16 2036 01/12/16 2356 01/13/16 0102 01/13/16 0403  BP: 133/82 (!) 150/90 (!) 145/93 (!) 152/95  Pulse: 60 (!) 59 (!) 56 66  Resp: Temp: 98.5 F (36.9 C) 98 F (36.7 C)  98 F (36.7 C)  TempSrc: Oral Oral  Oral  SpO2: 95% 95%  97%  Weight:    73.9 kg (163 lb)  Height:        General: Pt is alert, awake, not in acute distress Cardiovascular: RRR, S1/S2 +, no rubs, no gallops Respiratory: CTA bilaterally, no wheezing, no rhonchi Abdominal: Soft, NT, ND, bowel sounds + Extremities: no edema, no cyanosis   The results of significant diagnostics from this hospitalization (including imaging, microbiology, ancillary and laboratory) are listed below for reference.    Significant Diagnostic Studies: Dg Chest 2 View  Result Date: 01/09/2016 CLINICAL DATA:  Chest pain for 8 days EXAM: CHEST  2 VIEW COMPARISON:  January 02, 2016 FINDINGS: There are scattered areas of patchy fibrosis, primarily in the bases. There is no edema or consolidation. Heart is borderline enlarged with pulmonary vascularity within normal limits. There is atherosclerotic calcification in the aorta. No adenopathy. There is an old healed fracture of the left clavicle. IMPRESSION: Scattered areas of fibrotic change. No frank edema or consolidation. Stable cardiac prominence. Aortic atherosclerosis. Electronically Signed   By: Bretta Bang III M.D.   On: 01/09/2016 10:22  Dg Chest 2 View  Result Date: 01/02/2016 CLINICAL DATA:  Shortness of breath since Friday. Cough for a month. History of COPD and chest congestion. EXAM: CHEST  2 VIEW COMPARISON:  08/18/2015; 06/13/2015; 10/03/2013 FINDINGS: Grossly unchanged  cardiac silhouette and mediastinal contours. Atherosclerotic plaque within the thoracic aorta. The lungs remain hyperexpanded with flattening of the bilateral hemidiaphragms. Interval development of a suspected trace right-sided pleural effusion with small amount of fluid tracking within the right major and minor fissures. Query mild cephalization of flow. No focal airspace opacities. No pneumothorax. No acute osseus abnormalities. IMPRESSION:  1. Suspected mild pulmonary edema superimposed on advanced emphysematous change with trace right-sided pleural effusion. 2.  Emphysema. (ICD10-J43.9) 3.  Aortic Atherosclerosis (ICD10-170.0) Electronically Signed   By: Simonne Come M.D.   On: 01/02/2016 01:38  US Abdomen Limited Ruq  Result Date: 01/09/2016 CLINICAL DATA:  Acute right upper quadrant abdominal pain. EXAM: US ABDOMEN LIMITED - RIGHT UPPER QUADRANT COMPARISON:  None. FINDINGS: Gallbladder: No gallstones or wall thickening visualized. No sonographic Murphy sign noted by sonographer. Common bile duct: Diameter: 2 mm which is within normal limits. Liver: No focal lesion identified. Within normal limits in parenchymal echogenicity. IMPRESSION: No definite abnormality seen in the right upper quadrant of the abdomen. Electronically Signed   By: Lupita Raider, M.D.   On: 01/09/2016 16:06     Microbiology: No results found for this or any previous visit (from the past 240 hour(s)).   Labs: Basic Metabolic Panel:  Recent Labs Lab 01/09/16 0931 01/10/16 0330 01/11/16 0446 01/12/16 0453 01/13/16 0438  NA 141 144 140 139 140  K 3.5 3.5 4.2 3.5 3.5  CL 107 104 95* 96* 96*  CO2 29 34* 40* 38* 37*  GLUCOSE 129* 118* 104* 105* 103*  BUN 12 12 10 9 11   CREATININE 1.05 0.99 1.09 0.98 0.94  CALCIUM 8.7* 8.9 8.8* 8.8* 9.1  MG  --   --   --   --  1.9   Liver Function Tests:  Recent Labs Lab 01/09/16 1137 01/10/16 0330 01/12/16 0453  AST 147* 68* 30  ALT 110* 90* 53  ALKPHOS 168* 162* 124  BILITOT 0.9 0.6 0.7  PROT 6.4* 6.1* 5.8*  ALBUMIN 3.6 3.4* 3.2*    Recent Labs Lab 01/09/16 1137  LIPASE 24   No results for input(s): AMMONIA in the last 168 hours. CBC:  Recent Labs Lab 01/09/16 0931 01/10/16 0330 01/11/16 0446 01/12/16 0453  WBC 5.8 7.5 6.9 5.6  HGB 15.0 14.7 14.7 14.8  HCT 47.5 47.5 47.6 47.5  MCV 94.8 95.8 96.7 94.4  PLT 184 197 200 187   Cardiac Enzymes: No results for input(s): CKTOTAL, CKMB,  CKMBINDEX, TROPONINI in the last 168 hours. BNP: Invalid input(s): POCBNP CBG: No results for input(s): GLUCAP in the last 168 hours.  Time coordinating discharge:  Greater than 30 minutes  Signed:  Tiffanyann Deroo, DO [facility] Pager: [phone] 01/13/2016, 11:10 AM

## 2016-01-16 ENCOUNTER — Ambulatory Visit: Payer: Commercial Managed Care - HMO | Attending: Internal Medicine | Admitting: Physician Assistant

## 2016-01-16 VITALS — BP 143/87 | HR 69 | Temp 98.3°F | Resp 14 | Ht 75.0 in | Wt 170.8 lb

## 2016-01-16 DIAGNOSIS — R0902 Hypoxemia: Secondary | ICD-10-CM | POA: Diagnosis not present

## 2016-01-16 DIAGNOSIS — I110 Hypertensive heart disease with heart failure: Secondary | ICD-10-CM | POA: Insufficient documentation

## 2016-01-16 DIAGNOSIS — I5042 Chronic combined systolic (congestive) and diastolic (congestive) heart failure: Secondary | ICD-10-CM | POA: Diagnosis not present

## 2016-01-16 DIAGNOSIS — I5043 Acute on chronic combined systolic (congestive) and diastolic (congestive) heart failure: Secondary | ICD-10-CM | POA: Insufficient documentation

## 2016-01-16 DIAGNOSIS — Z9119 Patient's noncompliance with other medical treatment and regimen: Secondary | ICD-10-CM | POA: Insufficient documentation

## 2016-01-16 DIAGNOSIS — Z7982 Long term (current) use of aspirin: Secondary | ICD-10-CM | POA: Diagnosis not present

## 2016-01-16 DIAGNOSIS — Z8673 Personal history of transient ischemic attack (TIA), and cerebral infarction without residual deficits: Secondary | ICD-10-CM | POA: Insufficient documentation

## 2016-01-16 DIAGNOSIS — Z79899 Other long term (current) drug therapy: Secondary | ICD-10-CM | POA: Insufficient documentation

## 2016-01-16 LAB — BASIC METABOLIC PANEL
BUN: 13 mg/dL (ref 7–25)
CO2: 29 mmol/L (ref 20–31)
Calcium: 9 mg/dL (ref 8.6–10.3)
Chloride: 105 mmol/L (ref 98–110)
Creat: 0.83 mg/dL (ref 0.70–1.25)
Glucose, Bld: 94 mg/dL (ref 65–99)
Potassium: 4.1 mmol/L (ref 3.5–5.3)
Sodium: 143 mmol/L (ref 135–146)

## 2016-01-16 LAB — CBC WITH DIFFERENTIAL/PLATELET
Basophils Absolute: 58 cells/uL (ref 0–200)
Basophils Relative: 1 %
Eosinophils Absolute: 174 cells/uL (ref 15–500)
Eosinophils Relative: 3 %
HCT: 46.6 % (ref 38.5–50.0)
Hemoglobin: 15.7 g/dL (ref 13.2–17.1)
Lymphocytes Relative: 47 %
Lymphs Abs: 2726 cells/uL (ref 850–3900)
MCH: 30.1 pg (ref 27.0–33.0)
MCHC: 33.7 g/dL (ref 32.0–36.0)
MCV: 89.3 fL (ref 80.0–100.0)
MPV: 10.8 fL (ref 7.5–12.5)
Monocytes Absolute: 464 cells/uL (ref 200–950)
Monocytes Relative: 8 %
Neutro Abs: 2378 cells/uL (ref 1500–7800)
Neutrophils Relative %: 41 %
Platelets: 201 10*3/uL (ref 140–400)
RBC: 5.22 MIL/uL (ref 4.20–5.80)
RDW: 14.3 % (ref 11.0–15.0)
WBC: 5.8 10*3/uL (ref 3.8–10.8)

## 2016-01-16 LAB — HEPATIC FUNCTION PANEL
ALT: 29 U/L (ref 9–46)
AST: 21 U/L (ref 10–35)
Albumin: 3.7 g/dL (ref 3.6–5.1)
Alkaline Phosphatase: 113 U/L (ref 40–115)
Bilirubin, Direct: 0.1 mg/dL (ref <=0.2)
Indirect Bilirubin: 0.5 mg/dL (ref 0.2–1.2)
Total Bilirubin: 0.6 mg/dL (ref 0.2–1.2)
Total Protein: 6.2 g/dL (ref 6.1–8.1)

## 2016-01-16 MED ORDER — ASPIRIN 81 MG PO TBEC: 81.0000 mg | DELAYED_RELEASE_TABLET | Freq: Every day | ORAL | 3 refills | Status: DC

## 2016-01-16 MED ORDER — LOSARTAN POTASSIUM 50 MG PO TABS: 50.0000 mg | ORAL_TABLET | Freq: Every day | ORAL | 3 refills | Status: DC

## 2016-01-16 MED ORDER — CARVEDILOL 6.25 MG PO TABS: 6.2500 mg | ORAL_TABLET | Freq: Two times a day (BID) | ORAL | 3 refills | Status: DC

## 2016-01-16 MED ORDER — FUROSEMIDE 20 MG PO TABS: 20.0000 mg | ORAL_TABLET | Freq: Every day | ORAL | 3 refills | Status: DC

## 2016-01-16 NOTE — Patient Instructions (Signed)
Refills are available when you need them Watch your salt intake Stop smoking Keep appt in 2 weeks Call Dr. Tenny Craw and see her or an associate in 4 weeks

## 2016-01-16 NOTE — Progress Notes (Signed)
Pt here for hospital F/U. Pt denies pain today. Pt has taken medications today.  

## 2016-01-16 NOTE — Progress Notes (Signed)
Chief Complaint: Hospital followup  Subjective: This is a 60 year old male with a history of a dilated cardiomyopathy, hypertension, stroke, combined systolic and diastolic heart failure and continued tobacco abuse. He presented to the hospital on 01/09/2016 with weakness, increasing shortness of breath, sputum production, and dyspnea on exertion. He had been noncompliant with his heart healthy diet as well as medications for at least one week. He was found to be hypoxic. His peptide was greater than 2000. He was having a heart failure exacerbation. He was admitted by the medicine team and underwent IV diuresis. His updated echo shows an EF of 35-40%. He also has diastolic dysfunction.  Since discharge has been compliant with his medications. His breathing is much better. No cough. No chest pain. No swelling in his lower shortness. No change in weight.   ROS:  GEN: denies fever or chills, denies change in weight Skin: denies lesions or rashes HEENT: denies headache, earache, epistaxis, sore throat, or neck pain LUNGS: denies SHOB, dyspnea, PND, orthopnea CV: denies CP or palpitations ABD: denies abd pain, N or V EXT: denies muscle spasms or swelling; no pain in lower ext, no weakness NEURO: denies numbness or tingling, denies sz, stroke or TIA   Objective:  Vitals:   01/16/16 1056  BP: (!) 143/87  Pulse: 69  Resp: 14  Temp: 98.3 F (36.8 C)  TempSrc: Oral  SpO2: 96%  Weight: 170 lb 12.8 oz (77.5 kg)  Height: 6\' 3"  (1.905 m)    Physical Exam:  General: in no acute distress. HEENT: no pallor, no icterus, moist oral mucosa, no JVD, no lymphadenopathy Heart: Normal  s1 &s2  Regular rate and rhythm, without murmurs, rubs, gallops. Lungs: Clear to auscultation bilaterally. Abdomen: Soft, nontender, nondistended, positive bowel sounds. Extremities: No clubbing cyanosis or edema with positive pedal pulses. Neuro: Alert, awake, oriented x3, nonfocal.   Medications: Prior to  Admission medications   Medication Sig Start Date End Date Taking? Authorizing Provider  aspirin 81 MG EC tablet Take 1 tablet (81 mg total) by mouth daily. 01/16/16  Yes Melisha Eggleton Netta Cedars, PA-C  carvedilol (COREG) 6.25 MG tablet Take 1 tablet (6.25 mg total) by mouth 2 (two) times daily with a meal. 01/16/16  Yes Hani Campusano Netta Cedars, PA-C  furosemide (LASIX) 20 MG tablet Take 1 tablet (20 mg total) by mouth daily. 01/16/16  Yes Alisyn Lequire Netta Cedars, PA-C  losartan (COZAAR) 50 MG tablet Take 1 tablet (50 mg total) by mouth daily. 01/16/16  Yes Vivianne Master, PA-C    Assessment: 1. Acute on chronic combined systolic and diastolic CHF/ HFrEF 2. Smoker 3. DCM EF 35-40% 4. HTN 5. Hx CVA  Plan: Refill meds Encouraged compliance with diet and medications Smoking cessation discussed CBC/CMP as per DC recs Appt with [facility] in 4 weeks (Dr. Tenny Craw)  Follow up: 2 weeks  The patient was given clear instructions to go to ER or return to medical center if symptoms don't improve, worsen or new problems develop. The patient verbalized understanding. The patient was told to call to get lab results if they haven't heard anything in the next week.   This note has been created with Education officer, environmental. Any transcriptional errors are unintentional.   Scot Jun, PA-C 01/16/2016, 12:40 PM

## 2016-01-19 ENCOUNTER — Encounter: Payer: Self-pay | Admitting: Physician Assistant

## 2016-01-20 ENCOUNTER — Encounter: Payer: Self-pay | Admitting: Physician Assistant

## 2016-01-20 ENCOUNTER — Telehealth: Payer: Self-pay | Admitting: General Practice

## 2016-01-20 NOTE — Telephone Encounter (Signed)
I left a message on Jared [name]'s voicemail to disregard the note written for stating it was ok for Jared [name] returning to work next week.  I advised that they disregard the note and that Jared [name] should either schedule an appointment with [place] or his cardiologist to determine his work status.

## 2016-01-20 NOTE — Telephone Encounter (Signed)
Patient dropped off FMLA. Patient was last seen by Dr. Orpah Cobb. Patient aware of 7-14 day timeframe.  Please follow up.

## 2016-01-20 NOTE — Telephone Encounter (Signed)
Jared [name] pt. Employer called requesting to speak with the Dr. Claiborne Billings wrote the letter for the pt. Jared [name] states that the letter states that the pt. Can return to work on Monday but the pt. Suffers from COPD and has had heart attacks. Jared [name] would like to speak with the Dr. 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 Jared [name] can be reached at [phone] ext. [phone] or you can reach [facility] at [phone]. Please f/u

## 2016-01-24 ENCOUNTER — Telehealth: Payer: Self-pay | Admitting: Internal Medicine

## 2016-01-24 NOTE — Telephone Encounter (Signed)
Pt came into the office to pick up documents that he needs Dr to fill out for his employer. I informed pt that unfortunately he will need to come in for an appointment because doctor needs to see him. Pt has an appt on 8/25. Please follow up.  Thank you.

## 2016-01-27 ENCOUNTER — Telehealth: Payer: Self-pay

## 2016-01-27 NOTE — Telephone Encounter (Signed)
Writer called patient per Dr. Venetia Night to remind him that he has an appt next Friday 02/03/16 at 3:30.  Writer stressed how important this visit is due to the [employer] paperwork [employer] wants filled out so he can keep his job.  Patient states that "they are trying to get rid of me".  He agrees to keep the appt next week. Dr. Venetia Night will fill out his paperwork and it will be faxed.

## 2016-02-03 ENCOUNTER — Ambulatory Visit: Payer: Commercial Managed Care - HMO | Attending: Family Medicine | Admitting: Family Medicine

## 2016-02-03 ENCOUNTER — Encounter: Payer: Self-pay | Admitting: Family Medicine

## 2016-02-03 VITALS — BP 155/93 | HR 70 | Temp 98.2°F | Ht 75.0 in | Wt 173.6 lb

## 2016-02-03 DIAGNOSIS — I110 Hypertensive heart disease with heart failure: Secondary | ICD-10-CM | POA: Diagnosis not present

## 2016-02-03 DIAGNOSIS — I5023 Acute on chronic systolic (congestive) heart failure: Secondary | ICD-10-CM | POA: Diagnosis not present

## 2016-02-03 DIAGNOSIS — J441 Chronic obstructive pulmonary disease with (acute) exacerbation: Secondary | ICD-10-CM

## 2016-02-03 DIAGNOSIS — Z79899 Other long term (current) drug therapy: Secondary | ICD-10-CM | POA: Diagnosis not present

## 2016-02-03 DIAGNOSIS — Z87891 Personal history of nicotine dependence: Secondary | ICD-10-CM | POA: Diagnosis not present

## 2016-02-03 DIAGNOSIS — I429 Cardiomyopathy, unspecified: Secondary | ICD-10-CM

## 2016-02-03 DIAGNOSIS — I428 Other cardiomyopathies: Secondary | ICD-10-CM | POA: Insufficient documentation

## 2016-02-03 DIAGNOSIS — Z7982 Long term (current) use of aspirin: Secondary | ICD-10-CM | POA: Insufficient documentation

## 2016-02-03 DIAGNOSIS — I420 Dilated cardiomyopathy: Secondary | ICD-10-CM

## 2016-02-03 DIAGNOSIS — I10 Essential (primary) hypertension: Secondary | ICD-10-CM | POA: Diagnosis not present

## 2016-02-03 MED ORDER — BUDESONIDE-FORMOTEROL FUMARATE 160-4.5 MCG/ACT IN AERO: 2.0000 | INHALATION_SPRAY | Freq: Two times a day (BID) | RESPIRATORY_TRACT | 3 refills | Status: DC

## 2016-02-03 MED ORDER — LOSARTAN POTASSIUM 100 MG PO TABS: 100.0000 mg | ORAL_TABLET | Freq: Every day | ORAL | 3 refills | Status: DC

## 2016-02-03 MED ORDER — ALBUTEROL SULFATE HFA 108 (90 BASE) MCG/ACT IN AERS: 2.0000 | INHALATION_SPRAY | Freq: Four times a day (QID) | RESPIRATORY_TRACT | 3 refills | Status: DC | PRN

## 2016-02-03 MED FILL — LOSARTAN POTASSIUM 100 MG T: 100 | 30 days supply | Qty: 30 | Fill #0

## 2016-02-03 MED FILL — VENTOLIN HFA 90 MCG INHALER: 108 (90 BAS | 30 days supply | Qty: 18 | Fill #0

## 2016-02-03 MED FILL — SYMBICORT 160-4.5 MCG INH: 160-4.5 | 30 days supply | Qty: 10 | Fill #0

## 2016-02-03 NOTE — Patient Instructions (Signed)
Chronic Obstructive Pulmonary Disease Chronic obstructive pulmonary disease (COPD) is a common lung condition in which airflow from the lungs is limited. COPD is a general term that can be used to describe many different lung problems that limit airflow, including both chronic bronchitis and emphysema. If you have COPD, your lung function will probably never return to normal, but there are measures you can take to improve lung function and make yourself feel better. CAUSES   Smoking (common).  Exposure to secondhand smoke.  Genetic problems.  Chronic inflammatory lung diseases or recurrent infections. SYMPTOMS  Shortness of breath, especially with physical activity.  Deep, persistent (chronic) cough with a large amount of thick mucus.  Wheezing.  Rapid breaths (tachypnea).  Gray or bluish discoloration (cyanosis) of the skin, especially in your fingers, toes, or lips.  Fatigue.  Weight loss.  Frequent infections or episodes when breathing symptoms become much worse (exacerbations).  Chest tightness. DIAGNOSIS Your health care provider will take a medical history and perform a physical examination to diagnose COPD. Additional tests for COPD may include:  Lung (pulmonary) function tests.  Chest X-ray.  CT scan.  Blood tests. TREATMENT  Treatment for COPD may include:  Inhaler and nebulizer medicines. These help manage the symptoms of COPD and make your breathing more comfortable.  Supplemental oxygen. Supplemental oxygen is only helpful if you have a low oxygen level in your blood.  Exercise and physical activity. These are beneficial for nearly all people with COPD.  Lung surgery or transplant.  Nutrition therapy to gain weight, if you are underweight.  Pulmonary rehabilitation. This may involve working with a team of health care providers and specialists, such as respiratory, occupational, and physical therapists. HOME CARE INSTRUCTIONS  Take all medicines  (inhaled or pills) as directed by your health care provider.  Avoid over-the-counter medicines or cough syrups that dry up your airway (such as antihistamines) and slow down the elimination of secretions unless instructed otherwise by your health care provider.  If you are a smoker, the most important thing that you can do is stop smoking. Continuing to smoke will cause further lung damage and breathing trouble. Ask your health care provider for help with quitting smoking. He or she can direct you to community resources or hospitals that provide support.  Avoid exposure to irritants such as smoke, chemicals, and fumes that aggravate your breathing.  Use oxygen therapy and pulmonary rehabilitation if directed by your health care provider. If you require home oxygen therapy, ask your health care provider whether you should purchase a pulse oximeter to measure your oxygen level at home.  Avoid contact with individuals who have a contagious illness.  Avoid extreme temperature and humidity changes.  Eat healthy foods. Eating smaller, more frequent meals and resting before meals may help you maintain your strength.  Stay active, but balance activity with periods of rest. Exercise and physical activity will help you maintain your ability to do things you want to do.  Preventing infection and hospitalization is very important when you have COPD. Make sure to receive all the vaccines your health care provider recommends, especially the pneumococcal and influenza vaccines. Ask your health care provider whether you need a pneumonia vaccine.  Learn and use relaxation techniques to manage stress.  Learn and use controlled breathing techniques as directed by your health care provider. Controlled breathing techniques include:  Pursed lip breathing. Start by breathing in (inhaling) through your nose for 1 second. Then, purse your lips as if you were   going to whistle and breathe out (exhale) through the  pursed lips for 2 seconds.  Diaphragmatic breathing. Start by putting one hand on your abdomen just above your waist. Inhale slowly through your nose. The hand on your abdomen should move out. Then purse your lips and exhale slowly. You should be able to feel the hand on your abdomen moving in as you exhale.  Learn and use controlled coughing to clear mucus from your lungs. Controlled coughing is a series of short, progressive coughs. The steps of controlled coughing are: 1. Lean your head slightly forward. 2. Breathe in deeply using diaphragmatic breathing. 3. Try to hold your breath for 3 seconds. 4. Keep your mouth slightly open while coughing twice. 5. Spit any mucus out into a tissue. 6. Rest and repeat the steps once or twice as needed. SEEK MEDICAL CARE IF:  You are coughing up more mucus than usual.  There is a change in the color or thickness of your mucus.  Your breathing is more labored than usual.  Your breathing is faster than usual. SEEK IMMEDIATE MEDICAL CARE IF:  You have shortness of breath while you are resting.  You have shortness of breath that prevents you from:  Being able to talk.  Performing your usual physical activities.  You have chest pain lasting longer than 5 minutes.  Your skin color is more cyanotic than usual.  You measure low oxygen saturations for longer than 5 minutes with a pulse oximeter. MAKE SURE YOU:  Understand these instructions.  Will watch your condition.  Will get help right away if you are not doing well or get worse.   This information is not intended to replace advice given to you by your health care provider. Make sure you discuss any questions you have with your health care provider.   Document Released: 03/07/2005 Document Revised: 06/18/2014 Document Reviewed: 01/22/2013 Elsevier Interactive Patient Education 2016 [employer]  

## 2016-02-03 NOTE — Progress Notes (Signed)
Sob Medication refills Needs symbicort

## 2016-02-03 NOTE — Progress Notes (Signed)
Subjective:  Patient ID: Jared [name], male    DOB: 02/23/1956  Age: 60 y.o. MRN: [id number]  CC: Hypertension; Congestive Heart Failure; and COPD   HPI Jared [name] is a 60 year old male with a history of hypertension, COPD, previous history of stroke, CAD status post CABG, CHF (EF 35-40%) who comes in for follow-up visit. Of note he was admitted for CHF exacerbation and discharged 3 weeks ago.  He has baseline shortness of breath on mild-to-moderate exertion and on dressing himself but denies chest pain. He has been compliant with his medications and blood pressure is elevated. He currently does not have any inhalers for COPD as he stopped them due to cost but would like to get back on them.  Continues to smoke and is not willing to quit despite the fact that I have discussed the complications of COPD including chronic respiratory failure requiring oxygen but he informs me that he would rather be dead than go on oxygen.  Past Medical History:  Diagnosis Date  . CAD (coronary artery disease)   . CAP (community acquired pneumonia) 09/2013  . CHF (congestive heart failure) (HCC)    [name]/notes 01/09/2016  . Cluster headache    "hx; haven't had one in awhile" (01/09/2016)  . COPD (chronic obstructive pulmonary disease) (HCC)    [name]/notes 01/09/2016  . Hypertension   . Shortness of breath   . Stroke (HCC)   . Systolic heart failure (HCC)   . Tobacco abuse     Past Surgical History:  Procedure Laterality Date  . CARDIAC CATHETERIZATION  10/2010   LM normal, LAD with 20% irregularities, LCX with 20%, RCA with 40% prox and 40% distal - EF of 35%  . COLONOSCOPY W/ BIOPSIES AND POLYPECTOMY        Allergies  Allergen Reactions  . Lisinopril Cough      Outpatient Medications Prior to Visit  Medication Sig Dispense Refill  . aspirin 81 MG EC tablet Take 1 tablet (81 mg total) by mouth daily. 30 tablet 3  . carvedilol (COREG) 6.25 MG tablet Take 1 tablet (6.25 mg total) by mouth 2 (two)  times daily with a meal. 60 tablet 3  . furosemide (LASIX) 20 MG tablet Take 1 tablet (20 mg total) by mouth daily. 30 tablet 3  . losartan (COZAAR) 50 MG tablet Take 1 tablet (50 mg total) by mouth daily. 30 tablet 3   No facility-administered medications prior to visit.     ROS Review of Systems  Constitutional: Negative for activity change and appetite change.  HENT: Negative for sinus pressure and sore throat.   Eyes: Negative for visual disturbance.  Respiratory: Positive for shortness of breath. Negative for cough and chest tightness.   Cardiovascular: Negative for chest pain and leg swelling.  Gastrointestinal: Negative for abdominal distention, abdominal pain, constipation and diarrhea.  Endocrine: Negative.   Genitourinary: Negative for dysuria.  Musculoskeletal: Negative for joint swelling and myalgias.  Skin: Negative for rash.  Allergic/Immunologic: Negative.   Neurological: Negative for weakness, light-headedness and numbness.  Psychiatric/Behavioral: Negative for dysphoric mood and suicidal ideas.    Objective:  BP (!) 155/93 (BP Location: Right Arm, Patient Position: Sitting, Cuff Size: Large)   Pulse 70   Temp 98.2 F (36.8 C) (Oral)   Ht 6\' 3"  (1.905 m)   Wt 173 lb 9.6 oz (78.7 kg)   SpO2 94%   BMI 21.70 kg/m   BP/Weight 02/03/2016 01/16/2016 01/13/2016  Systolic BP 155 143 152  Diastolic BP  93 87 95  Wt. (Lbs) 173.6 170.8 163  BMI 21.7 21.35 -   Wt Readings from Last 3 Encounters:  02/03/16 173 lb 9.6 oz (78.7 kg)  01/16/16 170 lb 12.8 oz (77.5 kg)  01/13/16 163 lb (73.9 kg)      Physical Exam  Constitutional: He is oriented to person, place, and time. He appears well-developed and well-nourished.  Cardiovascular: Normal rate, normal heart sounds and intact distal pulses.   No murmur heard. Pulmonary/Chest: Effort normal and breath sounds normal. He has no wheezes. He has no rales. He exhibits no tenderness.  Abdominal: Soft. Bowel sounds are normal.  He exhibits no distension and no mass. There is no tenderness.  Musculoskeletal: Normal range of motion.  Neurological: He is alert and oriented to person, place, and time.   CMP Latest Ref Rng & Units 01/16/2016 01/13/2016 01/12/2016  Glucose 65 - 99 mg/dL 94 388(E) 280(K)  BUN 7 - 25 mg/dL 13 11 9   Creatinine 0.70 - 1.25 mg/dL 3.49 1.79 1.50  Sodium 135 - 146 mmol/L 143 140 139  Potassium 3.5 - 5.3 mmol/L 4.1 3.5 3.5  Chloride 98 - 110 mmol/L 105 96(L) 96(L)  CO2 20 - 31 mmol/L 29 37(H) 38(H)  Calcium 8.6 - 10.3 mg/dL 9.0 9.1 5.6(P)  Total Protein 6.1 - 8.1 g/dL 6.2 - 7.9(Y)  Total Bilirubin 0.2 - 1.2 mg/dL 0.6 - 0.7  Alkaline Phos 40 - 115 U/L 113 - 124  AST 10 - 35 U/L 21 - 30  ALT 9 - 46 U/L 29 - 53      Assessment & Plan:   1. COPD with acute exacerbation (HCC) Educated as smoking cessation will retard progression of COPD however he is nonchalant about this. - budesonide-formoterol (SYMBICORT) 160-4.5 MCG/ACT inhaler; Inhale 2 puffs into the lungs 2 (two) times daily.  Dispense: 1 Inhaler; Refill: 3 - albuterol (PROVENTIL HFA;VENTOLIN HFA) 108 (90 Base) MCG/ACT inhaler; Inhale 2 puffs into the lungs every 6 (six) hours as needed for wheezing or shortness of breath.  Dispense: 1 Inhaler; Refill: 3  2. Acute on chronic systolic congestive heart failure (HCC) EF 35-40% No evidence of fluid overload. Limit fluid intake to 2 L per day, daily weight checks-gained 10 pounds in the last 3 weeks Upcoming appointment with cardiology on 02/11/16  3. Essential hypertension Uncontrolled Losartan dose increased from 50 mg to 100 mg We'll reassess blood pressure at next visit and also send off labs to evaluate for hyperkalemia - losartan (COZAAR) 100 MG tablet; Take 1 tablet (100 mg total) by mouth daily.  Dispense: 30 tablet; Refill: 3  4. Nonischemic dilated cardiomyopathy (HCC) Stable   Meds ordered this encounter  Medications  . budesonide-formoterol (SYMBICORT) 160-4.5 MCG/ACT  inhaler    Sig: Inhale 2 puffs into the lungs 2 (two) times daily.    Dispense:  1 Inhaler    Refill:  3  . albuterol (PROVENTIL HFA;VENTOLIN HFA) 108 (90 Base) MCG/ACT inhaler    Sig: Inhale 2 puffs into the lungs every 6 (six) hours as needed for wheezing or shortness of breath.    Dispense:  1 Inhaler    Refill:  3  . losartan (COZAAR) 100 MG tablet    Sig: Take 1 tablet (100 mg total) by mouth daily.    Dispense:  30 tablet    Refill:  3    Discontinue previous dose    Follow-up: Return in about 6 weeks (around 03/16/2016) for Follow-up on hypertension.  Arnoldo Morale MD

## 2016-02-09 NOTE — Progress Notes (Signed)
Cardiology Office Note:    Date:  02/10/2016   ID:  Jared [name], DOB 04/29/1956, MRN [id number]  PCP:  Jaclyn ShaggyEnobong, Amao, MD  Cardiologist:  Dr. Dietrich PatesPaula Ross   Electrophysiologist:  n/a  Referring MD: No ref. provider found   Chief Complaint  Patient presents with  . Hospitalization Follow-up    CHF    History of Present Illness:    Jared [name] is a 60 y.o. male with a hx of combined systolic and diastolic CHF, nonischemic cardiomyopathy, mild nonobstructive CAD at cardiac catheterization in 2012, prior CVA, HTN, tobacco abuse. He was last seen by Dr. Tenny Crawoss in 12/16.  Admitted 7/31-8/4 with acute on chronic combined systolic and diastolic CHF. Notes indicate he had not been taking his medications. Echocardiogram demonstrated worsening LV function with an EF of 35-40%. LFTs were markedly elevated with AST 147 and ALT 110. He was discharged on carvedilol, furosemide, losartan.He has followed up with primary care since discharge. Follow-up labs demonstrate improved LFTs. He returns for follow-up.  He is here alone today. He has been doing fairly well since DC from the hospital. Weight has been fairly stable. He remains short of breath. He is NYHA 2b. Denies orthopnea, PND or edema. Denies chest discomfort or syncope. He does note a cough with sputum production and associated hemoptysis. The amount of blood is minimal. He denies fevers or chills. He continues to smoke cigarettes.  Prior CV studies that were reviewed today include:    Echo 01/10/16 EF 35-40%, diffuse HK, diastolic dysfunction, aortic sclerosis, trivial MR, moderate LAE, normal RVSF, moderate RAE, mild TR, PASP 42 mmHg  Myoview 4/16 Overall Impression: High risk stress nuclear study There is no evidence of ischemia. There is severe LV dysfunction. LV Ejection Fraction: 30%. LV Wall Motion: There is global LV hypokinesis.   Echo 4/16 Mild LVH, EF 40-45%, diffuse HK  Cardiac cath (10/11/2010) [facility]  Known cardiomyopathy prior. LM normal  LAD 20% irregularities LCX 20% proximal RCA: Dominant; 40% proximal, 40% distal LVEF 35% with diffuse hypokinesis.  Past Medical History:  Diagnosis Date  . CAD (coronary artery disease)    a. LHC 5/12:  LAD 20, pLCx 20, pRCA 40, dRCA 40, EF 35%, diff HK  //  b. Myoview 4/16: Overall Impression: High risk stress nuclear study There is no evidence of ischemia. There is severe LV dysfunction. LV Ejection Fraction: 30%. LV Wall Motion: There is global LV hypokinesis.   Marland Kitchen. CAP (community acquired pneumonia) 09/2013  . Chronic combined systolic and diastolic CHF (congestive heart failure) (HCC)    a. Echo 4/16:Mild LVH, EF 40-45%, diffuse HK //  b. Echo 8/17: EF 35-40%, diffuse HK, diastolic dysfunction, aortic sclerosis, trivial MR, moderate LAE, normal RVSF, moderate RAE, mild TR, PASP 42 mmHg  . Cluster headache    "hx; haven't had one in awhile" (01/09/2016)  . COPD (chronic obstructive pulmonary disease) (HCC)    [name]/notes 01/09/2016  . History of CVA (cerebrovascular accident)   . Hypertension   . NICM (nonischemic cardiomyopathy) (HCC)   . Tobacco abuse     Past Surgical History:  Procedure Laterality Date  . CARDIAC CATHETERIZATION  10/2010   LM normal, LAD with 20% irregularities, LCX with 20%, RCA with 40% prox and 40% distal - EF of 35%  . COLONOSCOPY W/ BIOPSIES AND POLYPECTOMY      Current Medications:   Current Meds  Medication Sig  . albuterol (PROVENTIL HFA;VENTOLIN HFA) 108 (90 Base)  MCG/ACT inhaler Inhale 2 puffs into the lungs every 6 (six) hours as needed for wheezing or shortness of breath.  Marland Kitchen aspirin 81 MG EC tablet Take 1 tablet (81 mg total) by mouth daily.  . budesonide-formoterol (SYMBICORT) 160-4.5 MCG/ACT inhaler Inhale 2 puffs into the lungs 2 (two) times daily.  . carvedilol (COREG) 6.25 MG tablet Take 6.25 mg by mouth 2 (two) times daily with a meal.  . furosemide (LASIX) 20 MG tablet Take 1  tablet (20 mg total) by mouth daily.  Marland Kitchen losartan (COZAAR) 100 MG tablet Take 1 tablet (100 mg total) by mouth daily.     Allergies:   Lisinopril   Social History   Social History  . Marital status: Married    Spouse name: N/A  . Number of children: N/A  . Years of education: N/A   Social History Main Topics  . Smoking status: Current Every Day Smoker    Packs/day: 1.00    Years: 42.00    Types: Cigarettes  . Smokeless tobacco: Never Used  . Alcohol use 3.6 oz/week    6 Glasses of wine per week     Comment: 01/09/2016 "1/5th (of wine mostly) over the weekend"  . Drug use: No     Comment: 01/09/2016 "none in 2000s"  . Sexual activity: Not Currently   Other Topics Concern  . None   Social History Narrative  . None     Family History:  The patient's family history includes Cancer in his father and mother; Diabetes in his brother, father, mother, and sister; Heart disease in his mother.   ROS:   Please see the history of present illness.    Review of Systems  Constitution: Positive for malaise/fatigue.  HENT: Positive for headaches.   Cardiovascular: Positive for chest pain, dyspnea on exertion and irregular heartbeat.  Respiratory: Positive for cough, shortness of breath and wheezing.   Hematologic/Lymphatic: Positive for bleeding problem.   All other systems reviewed and are negative.   EKGs/Labs/Other Test Reviewed:    EKG:  EKG is  ordered today.  The ekg ordered today demonstrates NSR, HR 73, left axis deviation, PACs, T-wave inversion V5-V6, LVH, QTc 458 ms, similar to prior tracings  Recent Labs: 01/10/2016: B Natriuretic Peptide 2,056.8 01/13/2016: Magnesium 1.9 01/16/2016: ALT 29; BUN 13; Creat 0.83; Hemoglobin 15.7; Platelets 201; Potassium 4.1; Sodium 143   Recent Lipid Panel    Component Value Date/Time   CHOL 147 01/11/2016 0446   TRIG 69 01/11/2016 0446   HDL 53 01/11/2016 0446   CHOLHDL 2.8 01/11/2016 0446   VLDL 14 01/11/2016 0446   LDLCALC 80  01/11/2016 0446     Physical Exam:    VS:  BP (!) 150/60   Pulse 74   Ht 6\' 3"  (1.905 m)   Wt 175 lb 6.4 oz (79.6 kg)   BMI 21.92 kg/m     Wt Readings from Last 3 Encounters:  02/10/16 175 lb 6.4 oz (79.6 kg)  02/03/16 173 lb 9.6 oz (78.7 kg)  01/16/16 170 lb 12.8 oz (77.5 kg)     Physical Exam  Constitutional: He is oriented to person, place, and time. He appears well-developed and well-nourished. No distress.  HENT:  Head: Normocephalic and atraumatic.  Eyes: No scleral icterus.  Neck: No JVD present.  Cardiovascular: Normal rate, regular rhythm and normal heart sounds.   No murmur heard. Pulmonary/Chest: He has decreased breath sounds. He has no wheezes. He has no rhonchi. He has rales in  the right lower field and the left lower field.  Faint rales at the bases bilaterally  Abdominal: Soft. He exhibits no mass. There is no tenderness.  Musculoskeletal: He exhibits no edema.  Neurological: He is alert and oriented to person, place, and time.  Skin: Skin is warm and dry.  Psychiatric: He has a normal mood and affect.    ASSESSMENT:    1. Chronic systolic CHF (congestive heart failure) (HCC)   2. Nonischemic dilated cardiomyopathy (HCC)   3. Hypertensive heart disease with heart failure (HCC)   4. Coronary artery disease involving native coronary artery of native heart without angina pectoris   5. Cough with hemoptysis   6. Tobacco abuse    PLAN:    In order of problems listed above:  1. Chronic combined systolic and diastolic CHF- The patient presents for post hospital follow-up after recent admission to the hospital with volume excess. Notes indicate he was out of medication when was admitted. He is now back on beta blocker, ARB and furosemide.  He notes NYHA 2b symptoms.  He has a cough with sputum production and scant hemoptysis.  No infectious symptoms.  Suspect his cough is related to CHF +/- COPD.  He works in Aeronautical engineer at [employer].  He was active  in this job prior to going to the hospital.   He feels that he is able to resume his usual activities and is able to take breaks when needed. His EF on recent echo is somewhat lower than previous echo in 2016. I suspect that this is related to nonadherence with medications. Today, he does have faint rales on exam. Given his cough, I will adjust his diuresis.  -  BMET, BNP today  -  Chest x-ray today  -  BMET 1 week  -  Increase Lasix to 40 mg daily 3 days. Then resume 20 g daily.  -  I will try to get prior authorization for Entresto and plan on initiation at next visit.    -  Continue Coreg.  -  Consider adding Spironolactone in the future as well.  -  If BNP significantly elevated, continue higher dose Lasix and add K+  -  Work note signed to release back to work.   2. NICM - EF 35-40% by recent Echo.  Will titrate medications and eventually repeat Echo assess LVEF.    3. HTN - BP elevated.  Will adjust Lasix first.  Consider Entresto, Spironolactone in the future.   4. CAD - Mild CAD at prior cath.  No angina. Continue aspirin. Eventually consider resuming statin therapy. With recent history of elevated LFTs, will hold off for now.  5. Cough with hemoptysis - Given his smoking history, there is certainly some concern for malignancy. However, his amount of hemoptysis is small. I will arrange a chest x-ray today. If hemoptysis continues, plan referral to pulmonology.  6. Tobacco abuse - He understands the importance of quitting.   Medication Adjustments/Labs and Tests Ordered: Current medicines are reviewed at length with the patient today.  Concerns regarding medicines are outlined above.  Medication changes, Labs and Tests ordered today are outlined in the Patient Instructions noted below. Patient Instructions  Medication Instructions:  INCREASE Lasix (Furosemide) to 40 mg Once daily for 3 days (you will take 2 of the Lasix 20 mg tablets). Then, after 3 days, resume taking Lasix 20 mg  Once daily   Labwork: Today - BMET, BNP 1 week - BMET (Friday, 9/8 -  you may come ANY TIME between 7:30AM and 5:00PM and you do NOT need to be fasting)  Testing/Procedures: Go get a Chest Xray today   Follow-Up: Tereso Newcomer, PA-C on Friday, 9/15 at 12:15.   Any Other Special Instructions Will Be Listed Below (If Applicable). I will see if we can get a new drug called Entresto approved for you.  This is a good drug for heart failure.   If you need a refill on your cardiac medications before your next appointment, please call your pharmacy.   Signed, Tereso Newcomer, PA-C  02/10/2016 9:03 AM    [facility] [address] [place], [place], [place]  [address] Phone: [phone]; Fax: [phone]

## 2016-02-10 ENCOUNTER — Encounter: Payer: Self-pay | Admitting: Physician Assistant

## 2016-02-10 ENCOUNTER — Encounter (INDEPENDENT_AMBULATORY_CARE_PROVIDER_SITE_OTHER): Payer: Self-pay

## 2016-02-10 ENCOUNTER — Ambulatory Visit (INDEPENDENT_AMBULATORY_CARE_PROVIDER_SITE_OTHER): Payer: Commercial Managed Care - HMO | Admitting: Physician Assistant

## 2016-02-10 ENCOUNTER — Ambulatory Visit
Admission: RE | Admit: 2016-02-10 | Discharge: 2016-02-10 | Disposition: A | Discharge status: Home / Self Care | Payer: Commercial Managed Care - HMO | Source: Ambulatory Visit | Attending: Physician Assistant | Admitting: Physician Assistant

## 2016-02-10 VITALS — BP 150/60 | HR 74 | Ht 75.0 in | Wt 175.4 lb

## 2016-02-10 DIAGNOSIS — R042 Hemoptysis: Secondary | ICD-10-CM

## 2016-02-10 DIAGNOSIS — I5022 Chronic systolic (congestive) heart failure: Secondary | ICD-10-CM | POA: Diagnosis not present

## 2016-02-10 DIAGNOSIS — I429 Cardiomyopathy, unspecified: Secondary | ICD-10-CM

## 2016-02-10 DIAGNOSIS — I110 Hypertensive heart disease with heart failure: Secondary | ICD-10-CM

## 2016-02-10 DIAGNOSIS — I2510 Atherosclerotic heart disease of native coronary artery without angina pectoris: Secondary | ICD-10-CM

## 2016-02-10 DIAGNOSIS — Z720 Tobacco use: Secondary | ICD-10-CM

## 2016-02-10 DIAGNOSIS — I420 Dilated cardiomyopathy: Secondary | ICD-10-CM

## 2016-02-10 LAB — BASIC METABOLIC PANEL
BUN: 16 mg/dL (ref 7–25)
CO2: 30 mmol/L (ref 20–31)
Calcium: 8.9 mg/dL (ref 8.6–10.3)
Chloride: 104 mmol/L (ref 98–110)
Creat: 1.04 mg/dL (ref 0.70–1.25)
Glucose, Bld: 121 mg/dL — ABNORMAL HIGH (ref 65–99)
Potassium: 3.9 mmol/L (ref 3.5–5.3)
Sodium: 144 mmol/L (ref 135–146)

## 2016-02-10 NOTE — Patient Instructions (Addendum)
Medication Instructions:  INCREASE Lasix (Furosemide) to 40 mg Once daily for 3 days (you will take 2 of the Lasix 20 mg tablets). Then, after 3 days, resume taking Lasix 20 mg Once daily   Labwork: Today - BMET, BNP 1 week - BMET (Friday, 9/8 - you may come ANY TIME between 7:30AM and 5:00PM and you do NOT need to be fasting)  Testing/Procedures: Go get a Chest Xray today   Follow-Up: Tereso Newcomer, PA-C on Friday, 9/15 at 12:15.   Any Other Special Instructions Will Be Listed Below (If Applicable). I will see if we can get a new drug called Entresto approved for you.  This is a good drug for heart failure.   If you need a refill on your cardiac medications before your next appointment, please call your pharmacy.

## 2016-02-11 LAB — BRAIN NATRIURETIC PEPTIDE: Brain Natriuretic Peptide: 580.5 pg/mL — ABNORMAL HIGH (ref <100)

## 2016-02-16 ENCOUNTER — Other Ambulatory Visit: Payer: Self-pay | Admitting: *Deleted

## 2016-02-16 MED ORDER — SPIRONOLACTONE 25 MG PO TABS: 12.5000 mg | ORAL_TABLET | Freq: Every day | ORAL | 6 refills | Status: DC

## 2016-02-16 MED ORDER — FUROSEMIDE 20 MG PO TABS: 20.0000 mg | ORAL_TABLET | Freq: Every day | ORAL | 3 refills | Status: DC

## 2016-02-17 ENCOUNTER — Telehealth: Payer: Self-pay | Admitting: *Deleted

## 2016-02-17 ENCOUNTER — Other Ambulatory Visit: Payer: Commercial Managed Care - HMO | Admitting: *Deleted

## 2016-02-17 DIAGNOSIS — I5022 Chronic systolic (congestive) heart failure: Secondary | ICD-10-CM

## 2016-02-17 DIAGNOSIS — I110 Hypertensive heart disease with heart failure: Secondary | ICD-10-CM

## 2016-02-17 LAB — BASIC METABOLIC PANEL
BUN: 13 mg/dL (ref 7–25)
CO2: 29 mmol/L (ref 20–31)
Calcium: 9 mg/dL (ref 8.6–10.3)
Chloride: 106 mmol/L (ref 98–110)
Creat: 1.01 mg/dL (ref 0.70–1.25)
Glucose, Bld: 89 mg/dL (ref 65–99)
Potassium: 3.6 mmol/L (ref 3.5–5.3)
Sodium: 144 mmol/L (ref 135–146)

## 2016-02-17 NOTE — Telephone Encounter (Signed)
Pt notified of lab results by phone with verbal understanding. Pt states he went to the pharmacy yesterday to pick up new Rx Entresto that Tereso Newcomer, PA prescribed yesterday at [facility]. Pt said Pharmacist said Rx not in and pt will not be able to pick up on Monday 02/20/16.

## 2016-02-20 ENCOUNTER — Emergency Department (HOSPITAL_COMMUNITY): Payer: Commercial Managed Care - HMO

## 2016-02-20 ENCOUNTER — Encounter (HOSPITAL_COMMUNITY): Payer: Self-pay

## 2016-02-20 ENCOUNTER — Emergency Department (HOSPITAL_COMMUNITY)
Admission: EM | Admit: 2016-02-20 | Discharge: 2016-02-20 | Disposition: A | Discharge status: Home / Self Care | Payer: Commercial Managed Care - HMO | Attending: Emergency Medicine | Admitting: Emergency Medicine

## 2016-02-20 DIAGNOSIS — I5022 Chronic systolic (congestive) heart failure: Secondary | ICD-10-CM | POA: Diagnosis not present

## 2016-02-20 DIAGNOSIS — I110 Hypertensive heart disease with heart failure: Secondary | ICD-10-CM | POA: Diagnosis not present

## 2016-02-20 DIAGNOSIS — I2510 Atherosclerotic heart disease of native coronary artery without angina pectoris: Secondary | ICD-10-CM | POA: Diagnosis not present

## 2016-02-20 DIAGNOSIS — R791 Abnormal coagulation profile: Secondary | ICD-10-CM | POA: Insufficient documentation

## 2016-02-20 DIAGNOSIS — R042 Hemoptysis: Secondary | ICD-10-CM | POA: Insufficient documentation

## 2016-02-20 DIAGNOSIS — J189 Pneumonia, unspecified organism: Secondary | ICD-10-CM | POA: Diagnosis not present

## 2016-02-20 DIAGNOSIS — F17210 Nicotine dependence, cigarettes, uncomplicated: Secondary | ICD-10-CM | POA: Diagnosis not present

## 2016-02-20 DIAGNOSIS — Z720 Tobacco use: Secondary | ICD-10-CM

## 2016-02-20 DIAGNOSIS — J449 Chronic obstructive pulmonary disease, unspecified: Secondary | ICD-10-CM | POA: Diagnosis not present

## 2016-02-20 DIAGNOSIS — R062 Wheezing: Secondary | ICD-10-CM | POA: Diagnosis not present

## 2016-02-20 DIAGNOSIS — I10 Essential (primary) hypertension: Secondary | ICD-10-CM

## 2016-02-20 DIAGNOSIS — R0602 Shortness of breath: Secondary | ICD-10-CM | POA: Diagnosis present

## 2016-02-20 DIAGNOSIS — R7989 Other specified abnormal findings of blood chemistry: Secondary | ICD-10-CM

## 2016-02-20 LAB — BASIC METABOLIC PANEL
Anion gap: 4 — ABNORMAL LOW (ref 5–15)
BUN: 12 mg/dL (ref 6–20)
CO2: 31 mmol/L (ref 22–32)
Calcium: 8.9 mg/dL (ref 8.9–10.3)
Chloride: 108 mmol/L (ref 101–111)
Creatinine, Ser: 0.98 mg/dL (ref 0.61–1.24)
GFR calc Af Amer: >60 mL/min (ref >60)
GFR calc non Af Amer: >60 mL/min (ref >60)
Glucose, Bld: 118 mg/dL — ABNORMAL HIGH (ref 65–99)
Potassium: 3.4 mmol/L — ABNORMAL LOW (ref 3.5–5.1)
Sodium: 143 mmol/L (ref 135–145)

## 2016-02-20 LAB — CBC
HCT: 47.6 % (ref 39.0–52.0)
Hemoglobin: 14.9 g/dL (ref 13.0–17.0)
MCH: 29.6 pg (ref 26.0–34.0)
MCHC: 31.3 g/dL (ref 30.0–36.0)
MCV: 94.4 fL (ref 78.0–100.0)
Platelets: 182 10*3/uL (ref 150–400)
RBC: 5.04 MIL/uL (ref 4.22–5.81)
RDW: 15 % (ref 11.5–15.5)
WBC: 7.8 10*3/uL (ref 4.0–10.5)

## 2016-02-20 LAB — TYPE AND SCREEN
ABO/RH(D): O POS
Antibody Screen: NEGATIVE

## 2016-02-20 LAB — BRAIN NATRIURETIC PEPTIDE: B Natriuretic Peptide: 867.6 pg/mL — ABNORMAL HIGH (ref 0.0–100.0)

## 2016-02-20 LAB — D-DIMER, QUANTITATIVE: D-Dimer, Quant: 1.55 ug/mL-FEU — ABNORMAL HIGH (ref 0.00–0.50)

## 2016-02-20 LAB — I-STAT TROPONIN, ED: Troponin i, poc: 0.05 ng/mL (ref 0.00–0.08)

## 2016-02-20 LAB — ABO/RH: ABO/RH(D): O POS

## 2016-02-20 MED ORDER — IPRATROPIUM BROMIDE 0.02 % IN SOLN
0.5000 mg | Freq: Once | RESPIRATORY_TRACT | Status: AC
  Administered 2016-02-20: 0.5 mg via RESPIRATORY_TRACT
  Filled 2016-02-20: qty 2.5

## 2016-02-20 MED ORDER — PREDNISONE 20 MG PO TABS
60.0000 mg | ORAL_TABLET | Freq: Once | ORAL | Status: AC
  Administered 2016-02-20: 60 mg via ORAL
  Filled 2016-02-20: qty 3

## 2016-02-20 MED ORDER — PREDNISONE 20 MG PO TABS: ORAL_TABLET | ORAL | 0 refills | Status: DC

## 2016-02-20 MED ORDER — ALBUTEROL SULFATE HFA 108 (90 BASE) MCG/ACT IN AERS: 2.0000 | INHALATION_SPRAY | RESPIRATORY_TRACT | 0 refills | Status: DC | PRN

## 2016-02-20 MED ORDER — ALBUTEROL SULFATE (2.5 MG/3ML) 0.083% IN NEBU
5.0000 mg | INHALATION_SOLUTION | Freq: Once | RESPIRATORY_TRACT | Status: AC
  Administered 2016-02-20: 5 mg via RESPIRATORY_TRACT
  Filled 2016-02-20: qty 6

## 2016-02-20 MED ORDER — IOPAMIDOL (ISOVUE-370) INJECTION 76%
INTRAVENOUS | Status: AC
  Administered 2016-02-20: 80 mL
  Filled 2016-02-20: qty 100

## 2016-02-20 MED ORDER — LEVOFLOXACIN 750 MG PO TABS: 750.0000 mg | ORAL_TABLET | Freq: Every day | ORAL | 0 refills | Status: DC

## 2016-02-20 MED ORDER — ACETAMINOPHEN 325 MG PO TABS
650.0000 mg | ORAL_TABLET | Freq: Once | ORAL | Status: AC
  Administered 2016-02-20: 650 mg via ORAL
  Filled 2016-02-20: qty 2

## 2016-02-20 NOTE — Discharge Instructions (Signed)
Continue to stay well-hydrated. Gargle warm salt water and spit it out. Continue to alternate between Tylenol and Ibuprofen for pain or fever. Use Mucinex for cough suppression/expectoration of mucus. Use inhaler as directed as needed for cough/wheezing/shortness of breath. Use home symbicort as directed by your doctor. Take prednisone as directed and until completed, starting tomorrow 02/21/16 since today's dose was already given. Take antibiotic as directed starting TODAY. May consider Using netipot and flonase to help with nasal congestion. May consider over-the-counter Benadryl or other antihistamine to decrease secretions and for watery itchy eyes. STOP SMOKING! Followup with your primary care doctor in 3-5 days for recheck of ongoing symptoms. Return to emergency department for emergent changing or worsening of symptoms.

## 2016-02-20 NOTE — ED Triage Notes (Signed)
PT reports chronic sob with copd that became worse this morning. Pt states he is also coughing up blood since Thursday. Pt in NAD at this time and breathing without difficulty on room air

## 2016-02-20 NOTE — ED Provider Notes (Signed)
[facility]-EMERGENCY DEPT Provider Note   CSN: [id number] Arrival date & time: 02/20/16  0545     History   Chief Complaint Chief Complaint  Patient presents with  . Shortness of Breath  . Hemoptysis    HPI Jared [name] is a 60 y.o. male with a PMHx of CAD, CHF, COPD, HTN, NICM, and tobacco abuse, who presents to the ED with complaints of 2 weeks of gradually worsening shortness of breath and hemoptysis. He states he went to his cardiologist (Dr. Tenny Craw) and saw Tereso Newcomer PA-C on 02/10/16, mentioned these issues to him, and was sent for a CXR which was negative. States that this morning he was having worsening symptoms, and this concerned him so he came to the emergency room. He states he is concerned for the possibility of cancer given the amount of hemoptysis he has been having. Reports that it's approximately a half of a handful of bright red blood mixed with white sputum, rather than a streak of blood. Associated symptoms include wheezing, and shortness of breath which he states is chronic but slightly worse than normal. He denies any changes in his cough but endorses having a cough with white sputum production mixed with the hemoptysis. Positive smoker. Positive family history of cardiac disease in his mother who had a CABG. Of note, he has not taken his medications this morning. He has not eaten yet and is requesting food here.   He denies fevers, chills, CP, LE swelling, recent travel/surgery/immobilization, family/personal hx of DVT/PE, diaphoresis, lightheadedness, claudication, orthopnea, weight changes (loss or gain), sore throat, abd pain, N/V/D/C, hematuria, dysuria, myalgias, arthralgias, numbness, tingling, weakness, or any other symptoms. He states "what does it take to get a cancer screen?" in regards to his concern about the hemoptysis he's having.     The history is provided by the patient and medical records. No language interpreter was used.  Shortness of Breath  This is a  chronic problem. The problem occurs frequently.The current episode started more than 1 week ago. The problem has been gradually worsening. Associated symptoms include cough (nothing changed), sputum production, hemoptysis and wheezing. Pertinent negatives include no fever, no sore throat, no orthopnea, no chest pain, no vomiting, no abdominal pain, no leg swelling and no claudication. He has tried nothing for the symptoms. The treatment provided no relief. He has had prior hospitalizations. He has had prior ED visits. Associated medical issues include COPD and CAD. Associated medical issues do not include PE or DVT.    Past Medical History:  Diagnosis Date  . CAD (coronary artery disease)    a. LHC 5/12:  LAD 20, pLCx 20, pRCA 40, dRCA 40, EF 35%, diff HK  //  b. Myoview 4/16: Overall Impression: High risk stress nuclear study There is no evidence of ischemia. There is severe LV dysfunction. LV Ejection Fraction: 30%. LV Wall Motion: There is global LV hypokinesis.   Marland Kitchen CAP (community acquired pneumonia) 09/2013  . Chronic combined systolic and diastolic CHF (congestive heart failure) (HCC)    a. Echo 4/16:Mild LVH, EF 40-45%, diffuse HK //  b. Echo 8/17: EF 35-40%, diffuse HK, diastolic dysfunction, aortic sclerosis, trivial MR, moderate LAE, normal RVSF, moderate RAE, mild TR, PASP 42 mmHg  . Cluster headache    "hx; haven't had one in awhile" (01/09/2016)  . COPD (chronic obstructive pulmonary disease) (HCC)    [name] 01/09/2016  . History of CVA (cerebrovascular accident)   . Hypertension   . NICM (nonischemic cardiomyopathy) (HCC)   .  Tobacco abuse     Patient Active Problem List   Diagnosis Date Noted  . Chronic systolic CHF (congestive heart failure) (HCC)   . Malnutrition of moderate degree 01/10/2016  . Chest pain 01/09/2016  . Tobacco abuse 01/09/2016  . COPD bronchitis 01/15/2014  . Atrial tachycardia, paroxysmal (HCC) 10/07/2013  . CAD (coronary artery disease), native  coronary artery 10/07/2013  . 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 COPD with acute exacerbation (HCC) 10/04/2013  . Sinus tachycardia (HCC) 10/04/2013  . CAP (community acquired pneumonia) 10/03/2013  . Acute respiratory failure (HCC) 10/03/2013  . Hypertension     Past Surgical History:  Procedure Laterality Date  . CARDIAC CATHETERIZATION  10/2010   LM normal, LAD with 20% irregularities, LCX with 20%, RCA with 40% prox and 40% distal - EF of 35%  . COLONOSCOPY W/ BIOPSIES AND POLYPECTOMY         Home Medications    Prior to Admission medications   Medication Sig Start Date End Date Taking? Authorizing Provider  albuterol (PROVENTIL HFA;VENTOLIN HFA) 108 (90 Base) MCG/ACT inhaler Inhale 2 puffs into the lungs every 6 (six) hours as needed for wheezing or shortness of breath. 02/03/16   Jaclyn Shaggy, MD  aspirin 81 MG EC tablet Take 1 tablet (81 mg total) by mouth daily. 01/16/16   Tiffany Netta Cedars, PA-C  budesonide-formoterol (SYMBICORT) 160-4.5 MCG/ACT inhaler Inhale 2 puffs into the lungs 2 (two) times daily. 02/03/16   Jaclyn Shaggy, MD  carvedilol (COREG) 6.25 MG tablet Take 6.25 mg by mouth 2 (two) times daily with a meal.    Historical Provider, MD  furosemide (LASIX) 20 MG tablet Take 1 tablet (20 mg total) by mouth daily. 02/16/16   Beatrice Lecher, PA-C  losartan (COZAAR) 100 MG tablet Take 1 tablet (100 mg total) by mouth daily. 02/03/16   Jaclyn Shaggy, MD  spironolactone (ALDACTONE) 25 MG tablet Take 0.5 tablets (12.5 mg total) by mouth daily. 02/16/16   Beatrice Lecher, PA-C    Family History Family History  Problem Relation Age of Onset  . Cancer Mother   . Heart disease Mother   . Diabetes Mother   . Cancer Father   . Diabetes Father   . Diabetes Sister   . Diabetes Brother     Social History Social History  Substance Use Topics  . Smoking status: Current Every Day Smoker    Packs/day: 1.00    Years: 42.00    Types: Cigarettes  . Smokeless  tobacco: Never Used  . Alcohol use 3.6 oz/week    6 Glasses of wine per week     Comment: 01/09/2016 "1/5th (of wine mostly) over the weekend"     Allergies   Lisinopril   Review of Systems Review of Systems  Constitutional: Negative for chills, diaphoresis, fever and unexpected weight change.  HENT: Negative for sore throat.   Respiratory: Positive for cough (nothing changed), hemoptysis, sputum production, shortness of breath and wheezing.        +hemoptysis  Cardiovascular: Negative for chest pain, orthopnea, claudication and leg swelling.  Gastrointestinal: Negative for abdominal pain, constipation, diarrhea, nausea and vomiting.  Genitourinary: Negative for dysuria and hematuria.  Musculoskeletal: Negative for arthralgias and myalgias.  Skin: Negative for color change.  Allergic/Immunologic: Negative for immunocompromised state.  Neurological: Negative for weakness, light-headedness and numbness.  Psychiatric/Behavioral: Negative for confusion.   10 Systems reviewed and are negative for acute change except as noted in the HPI.   Physical  Exam Updated Vital Signs BP (!) 174/115   Pulse 88   Temp 98.2 F (36.8 C) (Oral)   Resp 23   Ht 6\' 3"  (1.905 m)   Wt 79.4 kg   SpO2 94%   BMI 21.87 kg/m    Physical Exam  Constitutional: He is oriented to person, place, and time. Vital signs are normal. He appears well-developed and well-nourished.  Non-toxic appearance. No distress.  Afebrile, nontoxic, NAD  HENT:  Head: Normocephalic and atraumatic.  Mouth/Throat: Oropharynx is clear and moist and mucous membranes are normal.  Eyes: Conjunctivae and EOM are normal. Right eye exhibits no discharge. Left eye exhibits no discharge.  Neck: Normal range of motion. Neck supple. No JVD present.  No JVD  Cardiovascular: Normal rate, regular rhythm, normal heart sounds and intact distal pulses.  Exam reveals no gallop and no friction rub.   No murmur heard. RRR, nl s1/s2, no m/r/g,  distal pulses intact, no pedal edema   Pulmonary/Chest: Effort normal. No respiratory distress. He has decreased breath sounds. He has wheezes. He has no rhonchi. He has no rales.  Diminished lung sounds in all lung fields which makes it difficult to auscultate any rales/rhonchi, but no r/r heard; faint expiratory wheezing throughout. No hypoxia or increased WOB, speaking in full sentences, SpO2 96-99% on RA during my exam Intermittent cough during exam, white sputum mixed with scant amount of bright red blood  Abdominal: Soft. Normal appearance and bowel sounds are normal. He exhibits no distension. There is no tenderness. There is no rigidity, no rebound, no guarding, no CVA tenderness, no tenderness at McBurney's point and negative Murphy's sign.  Musculoskeletal: Normal range of motion.  MAE x4 Strength and sensation grossly intact Distal pulses intact Gait steady No pedal edema, neg homan's bilaterally   Neurological: He is alert and oriented to person, place, and time. He has normal strength. No sensory deficit.  Skin: Skin is warm, dry and intact. No rash noted.  Psychiatric: He has a normal mood and affect.  Nursing note and vitals reviewed.    ED Treatments / Results  Labs (all labs ordered are listed, but only abnormal results are displayed) Labs Reviewed  BASIC METABOLIC PANEL - Abnormal; Notable for the following:       Result Value   Potassium 3.4 (*)    Glucose, Bld 118 (*)    Anion gap 4 (*)    All other components within normal limits  BRAIN NATRIURETIC PEPTIDE - Abnormal; Notable for the following:    B Natriuretic Peptide 867.6 (*)    All other components within normal limits  D-DIMER, QUANTITATIVE (NOT AT [facility]) - Abnormal; Notable for the following:    D-Dimer, Quant 1.55 (*)    All other components within normal limits  CBC  I-STAT TROPOININ, ED  TYPE AND SCREEN  ABO/RH    EKG  EKG Interpretation  Date/Time:  Monday February 20 2016 06:01:28  EDT Ventricular Rate:  83 PR Interval:    QRS Duration: 98 QT Interval:  414 QTC Calculation: 487 R Axis:   -65 Text Interpretation:  Sinus rhythm Atrial premature complexes Left anterior fascicular block Left ventricular hypertrophy Confirmed by [facility]  MD, Morene Antu ([id number]) on 02/20/2016 6:06:32 AM       Radiology Dg Chest 2 View  Result Date: 02/20/2016 CLINICAL DATA:  Several weeks of cough, onset of hemoptysis this week, history of CHF, COPD, current smoker. EXAM: CHEST  2 VIEW COMPARISON:  PA and lateral chest x-ray  of February 10, 2016 FINDINGS: The lungs remain hyperinflated. There has been interval development of interstitial density throughout the right lung and at the left lung base. No alveolar infiltrates are observed. There is no pneumothorax or significant pleural effusion. The cardiac silhouette and pulmonary vascularity are normal. There is calcification in the wall of the aortic arch. IMPRESSION: Bilateral pneumonia greatest on the right superimposed upon COPD. Followup PA and lateral chest X-ray is recommended in 3-4 weeks following trial of antibiotic therapy to ensure resolution and exclude underlying malignancy. Aortic atherosclerosis. Electronically Signed   By: David  [place] M.D.   On: 02/20/2016 07:23   Ct Angio Chest Pe W Or Wo Contrast  Result Date: 02/20/2016 CLINICAL DATA:  Elevated D-dimer EXAM: CT ANGIOGRAPHY CHEST WITH CONTRAST TECHNIQUE: Multidetector CT imaging of the chest was performed using the standard protocol during bolus administration of intravenous contrast. Multiplanar CT image reconstructions and MIPs were obtained to evaluate the vascular anatomy. CONTRAST:  Chest x-ray same day COMPARISON:  Chest x-rays 02/20/2016 FINDINGS: Borderline cardiomegaly No pericardial effusion. Atherosclerotic calcifications of coronary arteries. The study is of excellent technical quality. No pulmonary embolus is noted. There is no aortic aneurysm. Atherosclerotic  calcifications of thoracic aorta. No mediastinal hematoma or adenopathy. There is no hilar adenopathy. Images of the lung parenchyma shows patchy infiltrate with some air bronchogram in right lower lobe. Small patchy infiltrate noted in left lower lobe posteriorly. Bronchitic changes are noted bilateral lower lobes. Axial image 94 significant bronchial mucous plugging noted in at least 2 segmental bronchial branches in right lower lobe. Mild emphysematous changes are noted bilaterally. Centrilobular emphysematous changes are noted bilateral upper lobes. There is mild hyperinflation. The visualized upper abdomen shows no adrenal gland mass. Atherosclerotic calcifications of abdominal aorta. Review of the MIP images confirms the above findings. IMPRESSION: 1. The study is of excellent technical quality. No pulmonary embolus is noted. 2. Atherosclerotic calcifications thoracic aorta and coronary arteries. Borderline cardiomegaly is noted. 3. There is infiltrate with air bronchogram in right lower lobe posteriorly. Bronchial mucous plugging noted in right lower lobe. Small patchy infiltrate in left lower lobe posteriorly. Follow-up to resolution after appropriate treatment is recommended. 4. Bilateral emphysematous changes are noted.  No pulmonary edema. Mild bronchitic changes bilateral lower lobe. Electronically Signed   By: Natasha Mead M.D.   On: 02/20/2016 10:12    Echo 01/10/16: Study Conclusions - Left ventricle: Systolic function was moderately reduced. The   estimated ejection fraction was in the range of 35% to 40%.   Diffuse hypokinesis. Doppler parameters are consistent with   diastolic dysfunction. - Aortic valve: Sclerosis without stenosis. There was no   regurgitation. - Mitral valve: Mildly thickened leaflets . There was trivial   regurgitation. - Left atrium: Moderately dilated. - Right ventricle: The cavity size was mildly dilated. Wall   thickness was normal. Systolic function was  normal. - Right atrium: Moderately dilated. - Atrial septum: No defect or patent foramen ovale was identified. - Tricuspid valve: There was mild regurgitation. - Pulmonary arteries: PA peak pressure: 42 mm Hg (S). - Inferior vena cava: The IVC measures <2.1 cm, but does not   collapse >50%, suggesting an elevated RA pressure of 8 mmHg,  Impressions: - Compared to a prior echo in 2016, the LVEF is lower at 35-40%.  Myoview 4/16: Overall Impression: High risk stress nuclear study There is no evidence of ischemia. There is severe LV dysfunction. LV Ejection Fraction: 30%. LV Wall Motion: There is global LV  hypokinesis.   Cardiac cath(10/11/2010): Done for [facility]) Known cardiomyopathy prior. LM normal  LAD 20% irregularities LCX 20% proximal RCA: Dominant; 40% proximal, 40% distal LVEF 35% with diffuse hypokinesis.   Procedures Procedures (including critical care time)  Medications Ordered in ED Medications  albuterol (PROVENTIL) (2.5 MG/3ML) 0.083% nebulizer solution 5 mg (5 mg Nebulization Given 02/20/16 0622)  albuterol (PROVENTIL) (2.5 MG/3ML) 0.083% nebulizer solution 5 mg (5 mg Nebulization Given 02/20/16 0721)  ipratropium (ATROVENT) nebulizer solution 0.5 mg (0.5 mg Nebulization Given 02/20/16 0722)  predniSONE (DELTASONE) tablet 60 mg (60 mg Oral Given 02/20/16 0722)  acetaminophen (TYLENOL) tablet 650 mg (650 mg Oral Given 02/20/16 0733)  iopamidol (ISOVUE-370) 76 % injection (80 mLs  Contrast Given 02/20/16 0948)     Initial Impression / Assessment and Plan / ED Course  I have reviewed the triage vital signs and the nursing notes.  Pertinent labs & imaging results that were available during my care of the patient were reviewed by me and considered in my medical decision making (see chart for details).  Clinical Course    60 y.o. male here with hemoptysis x2 wks, gradually worsening SOB, and wheezing. On exam, diminished lung  sounds, very faint expiratory wheeze, no audible rales/rhonchi but difficult to assess given diminished sounds, states he feels some better after albuterol. He's mostly concerned with the hemoptysis, mentioned it to his cardiology  PA on 02/10/16 and was sent for CXR and BMP/BNP; CXR without acute findings. He tells me he's concerned about the possibility of cancer. +Smoker. Denies CP. No tachycardia, SpO2 96-99% on RA on my exam. PE is a possibility with the hemoptysis, although less likely than cancer vs throat irritation causing hemoptysis, although volume of blood he describes is more than just pharyngeal irritation. CBC and BMP WNL. EKG unchanged from prior. Will add-on BNP, trop, d-dimer, and CXR. If D-dimer elevated then he will need CT chest (which he hasn't had, ever, surprisingly). Will repeat duoneb and give prednisone in case his worsening SOB is due to COPD, especially since his BNP 10 days ago was 580 (much better than his prior) and he doesn't look fluid overloaded. Will reassess shortly  8:16 AM Trop WNL. BNP 867.6 which is slightly elevated compared to last week but not nearly as bad as it's been. D-dimer 1.55 therefore will need to get CT chest. CXR showing b/l PNA greatest on R with superimposed COPD, recommends f/up after abx to ensure resolution and ensure no underlying malignancy. Given elevated D-dimer, will get CTA which can help see if there is underlying malignancy. Pt feels better after duoneb, lung sounds still diminished but better, no ongoing wheezing. Will reassess after CTA  10:34 AM CTA showing RLL infiltrate and patchy LLL infiltrate, no PE, no evidence of cancerous lesion reported which is reassuring. Pt feeling improved compared to when he arrived, feels less SOB and feels okay going home. No increased WOB on re-eval, no hypoxia. Will send home with levaquin and prednisone, refill albuterol inhaler, discussed use of home inhalers as well. F/up closely with PCP in 3-5 days for  recheck and ongoing management. Smoking cessation encouraged. Strict return precautions discussed. I explained the diagnosis and have given explicit precautions to return to the ER including for any other new or worsening symptoms. The patient understands and accepts the medical plan as it's been dictated and I have answered their questions. Discharge instructions concerning home care and prescriptions have been given. The patient is STABLE  and is discharged to home in good condition.   Final Clinical Impressions(s) / ED Diagnoses   Final diagnoses:  CAP (community acquired pneumonia)  Hemoptysis  Shortness of breath  Wheezing  Essential hypertension  Elevated d-dimer  Tobacco use    New Prescriptions New Prescriptions   ALBUTEROL (PROVENTIL HFA;VENTOLIN HFA) 108 (90 BASE) MCG/ACT INHALER    Inhale 2 puffs into the lungs every 4 (four) hours as needed for wheezing or shortness of breath (cough).   LEVOFLOXACIN (LEVAQUIN) 750 MG TABLET    Take 1 tablet (750 mg total) by mouth daily. X 7 days   PREDNISONE (DELTASONE) 20 MG TABLET    3 tabs po daily x 4 days STARTING 02/21/16     Allen DerryMercedes Camprubi-Soms, PA-C 02/20/16 1035    April Palumbo, MD 02/20/16 [phone]

## 2016-02-22 ENCOUNTER — Encounter: Payer: Self-pay | Admitting: Physician Assistant

## 2016-02-23 NOTE — Progress Notes (Signed)
Cardiology Office Note:    Date:  02/24/2016   ID:  Jared [name], DOB [birth year], MRN [id number]  PCP:  Jared Shaggy, MD  Cardiologist:  Dr. Dietrich Pates   Electrophysiologist:  n/a  Referring MD: No ref. provider found   Chief Complaint  Patient presents with  . Follow-up    CHF   History of Present Illness:    Jared [name] is a 60 y.o. male with a hx of combined systolic and diastolic CHF, nonischemic cardiomyopathy, mild nonobstructive CAD at cardiac catheterization in 2012, prior CVA, HTN, tobacco abuse.   Admitted in 7/17 with a/c combined systolic and diastolic CHF.  He had not been taking his medications. Echocardiogram demonstrated worsening LV function with an EF of 35-40%.   I saw him for FU 02/10/16.  I tried to get him on Spironolactone but he could not get it from his pharmacy. He did complain of cough with hemoptysis.  A CXR was clear.  I was able to get him approved for [facility] through his insurance. He then went to the ED 02/20/16 with worsening symptoms and was dx with pneumonia.  He was placed on antibiotics.  He had RLL pneumonia as well as small patchy infiltrate in the LLL.  He returns for FU.  He is here alone.  His cough and hemoptysis is improved.  However, it is not resolved. His breathing is stable.  He has vague L sided chest discomfort that has been persistent for a month. It is not exertional.  He denies orthopnea, PND.  He notes LE edema.  His weight is up 7 lbs since last seen.    Prior CV studies that were reviewed today include:    Chest CTA 02/20/16 IMPRESSION: 1. The study is of excellent technical quality. No pulmonary embolus is noted. 2. Atherosclerotic calcifications thoracic aorta and coronary arteries. Borderline cardiomegaly is noted. 3. There is infiltrate with air bronchogram in right lower lobe posteriorly. Bronchial mucous plugging noted in right lower lobe. Small patchy infiltrate in left lower lobe posteriorly. Follow-up to resolution  after appropriate treatment is recommended. 4. Bilateral emphysematous changes are noted.  No pulmonary edema. Mild bronchitic changes bilateral lower lobe.  Echo 01/10/16 EF 35-40%, diffuse HK, diastolic dysfunction, aortic sclerosis, trivial MR, moderate LAE, normal RVSF, moderate RAE, mild TR, PASP 42 mmHg  Myoview 4/16 Overall Impression: High risk stress nuclear study There is no evidence of ischemia. There is severe LV dysfunction. LV Ejection Fraction: 30%. LV Wall Motion: There is global LV hypokinesis.   Echo 4/16 Mild LVH, EF 40-45%, diffuse HK  Cardiac cath(10/11/2010) Done for [facility]) Known cardiomyopathy prior. LM normal  LAD 20% irregularities LCX 20% proximal RCA: Dominant; 40% proximal, 40% distal LVEF 35% with diffuse hypokinesis.  Past Medical History:  Diagnosis Date  . CAD (coronary artery disease)    a. LHC 5/12:  LAD 20, pLCx 20, pRCA 40, dRCA 40, EF 35%, diff HK  //  b. Myoview 4/16: Overall Impression: High risk stress nuclear study There is no evidence of ischemia. There is severe LV dysfunction. LV Ejection Fraction: 30%. LV Wall Motion: There is global LV hypokinesis.   [facility] CAP (community acquired pneumonia) 09/2013  . Chronic combined systolic and diastolic CHF (congestive heart failure) (HCC)    a. Echo 4/16:Mild LVH, EF 40-45%, diffuse HK //  b. Echo 8/17: EF 35-40%, diffuse HK, diastolic dysfunction, aortic sclerosis, trivial MR, moderate LAE, normal RVSF, moderate RAE, mild TR, PASP 42 mmHg  .  Cluster headache    "hx; haven't had one in awhile" (01/09/2016)  . COPD (chronic obstructive pulmonary disease) (HCC)    Jared [name]/notes 01/09/2016  . History of CVA (cerebrovascular accident)   . Hypertension   . NICM (nonischemic cardiomyopathy) (HCC)   . Tobacco abuse     Past Surgical History:  Procedure Laterality Date  . CARDIAC CATHETERIZATION  10/2010   LM normal, LAD with 20% irregularities, LCX with 20%, RCA with  40% prox and 40% distal - EF of 35%  . COLONOSCOPY W/ BIOPSIES AND POLYPECTOMY      Current Medications: Outpatient Medications Prior to Visit  Medication Sig Dispense Refill  . albuterol (PROVENTIL HFA;VENTOLIN HFA) 108 (90 Base) MCG/ACT inhaler Inhale 2 puffs into the lungs every 6 (six) hours as needed for wheezing or shortness of breath. 1 Inhaler 3  . aspirin 81 MG EC tablet Take 1 tablet (81 mg total) by mouth daily. 30 tablet 3  . budesonide-formoterol (SYMBICORT) 160-4.5 MCG/ACT inhaler Inhale 2 puffs into the lungs 2 (two) times daily. 1 Inhaler 3  . carvedilol (COREG) 6.25 MG tablet Take 6.25 mg by mouth 2 (two) times daily with a meal.    . furosemide (LASIX) 20 MG tablet Take 1 tablet (20 mg total) by mouth daily. 30 tablet 3  . losartan (COZAAR) 100 MG tablet Take 1 tablet (100 mg total) by mouth daily. 30 tablet 3  . albuterol (PROVENTIL HFA;VENTOLIN HFA) 108 (90 Base) MCG/ACT inhaler Inhale 2 puffs into the lungs every 4 (four) hours as needed for wheezing or shortness of breath (cough). 1 Inhaler 0  . levofloxacin (LEVAQUIN) 750 MG tablet Take 1 tablet (750 mg total) by mouth daily. X 7 days 7 tablet 0  . predniSONE (DELTASONE) 20 MG tablet 3 tabs po daily x 4 days STARTING 02/21/16 12 tablet 0  . spironolactone (ALDACTONE) 25 MG tablet Take 0.5 tablets (12.5 mg total) by mouth daily. 15 tablet 6   No facility-administered medications prior to visit.       Allergies:   Lisinopril   Social History   Social History  . Marital status: Married    Spouse name: N/A  . Number of children: N/A  . Years of education: N/A   Social History Main Topics  . Smoking status: Current Every Day Smoker    Packs/day: 1.00    Years: 42.00    Types: Cigarettes  . Smokeless tobacco: Never Used  . Alcohol use 3.6 oz/week    6 Glasses of wine per week     Comment: 01/09/2016 "1/5th (of wine mostly) over the weekend"  . Drug use: No     Comment: 01/09/2016 "none in 2000s"  . Sexual  activity: Not Currently   Other Topics Concern  . None   Social History Narrative  . None     Family History:  The patient's family history includes Cancer in his father and mother; Diabetes in his brother, father, mother, and sister; Heart disease in his mother.   ROS:   Please see the history of present illness.    Review of Systems  Constitution: Positive for diaphoresis.  HENT: Positive for headaches.   Cardiovascular: Positive for chest pain, dyspnea on exertion and leg swelling.  Respiratory: Positive for cough, shortness of breath, snoring and wheezing.    All other systems reviewed and are negative.   EKGs/Labs/Other Test Reviewed:    EKG:  EKG is not ordered today.  The ekg ordered today demonstrates n/a  Recent Labs: 01/13/2016: Magnesium 1.9 01/16/2016: ALT 29 02/20/2016: B Natriuretic Peptide 867.6; BUN 12; Creatinine, Ser 0.98; Hemoglobin 14.9; Platelets 182; Potassium 3.4; Sodium 143   Recent Lipid Panel    Component Value Date/Time   CHOL 147 01/11/2016 0446   TRIG 69 01/11/2016 0446   HDL 53 01/11/2016 0446   CHOLHDL 2.8 01/11/2016 0446   VLDL 14 01/11/2016 0446   LDLCALC 80 01/11/2016 0446     Physical Exam:    VS:  BP (!) 170/100   Pulse 98   Ht 6\' 3"  (1.905 m)   Wt 182 lb 12.8 oz (82.9 kg)   BMI 22.85 kg/m     Wt Readings from Last 3 Encounters:  02/24/16 182 lb 12.8 oz (82.9 kg)  02/20/16 175 lb (79.4 kg)  02/10/16 175 lb 6.4 oz (79.6 kg)     Physical Exam  Constitutional: He is oriented to person, place, and time. He appears well-developed and well-nourished. No distress.  HENT:  Head: Normocephalic and atraumatic.  Eyes: No scleral icterus.  Neck: No JVD present.  Cardiovascular: Normal rate, regular rhythm and normal heart sounds.   No murmur heard. Pulmonary/Chest: Effort normal. He has no wheezes. He has no rales.  Abdominal: Soft. There is no tenderness.  Musculoskeletal: He exhibits edema.  1+ bilateral LE edema     Neurological: He is alert and oriented to person, place, and time.  Skin: Skin is warm and dry.  Psychiatric: He has a normal mood and affect.    ASSESSMENT:    1. Chronic combined systolic and diastolic CHF (congestive heart failure) (HCC)   2. Nonischemic dilated cardiomyopathy (HCC)   3. Hypertensive heart disease with heart failure (HCC)   4. Coronary artery disease involving native coronary artery of native heart without angina pectoris   5. CAP (community acquired pneumonia)   6. Tobacco abuse    PLAN:    In order of problems listed above:  1. Chronic combined systolic and diastolic CHF - He was recently treated for pneumonia.  His weight is up and he legs are swollen.  He is somewhat volume overloaded.  I was able to get him approved for Entresto last time.    -  DC Losartan  -  Start Entresto 49/51 mg bid  -  Increase Lasix to 40 mg QD  -  K+ 20 mEq QD  -  BMET 1 week  -  He never started Spironolactone - will hold off for now and consider in future  2. NICM - EF 35-40% by recent Echo.  Will titrate medications and eventually repeat Echo assess LVEF.    3. HTN - BP elevated.  Adjust Lasix.  Start Entresto.  Consider increasing Coreg and adding Spironolactone at next visit.    4. CAD - Mild CAD at prior cath.   He notes vague L sided chest pain that has been chronic for several weeks.  Continue ASA, beta blocker.  If continues at FU, plan stress test.  5. Pneumonia - Seems to be improved.  Given his smoking hx and the extent of his pneumonia as well as his hemoptysis, I think it would be best to have him evaluated by Pulmonology. Refer to Pulmonology.    6. Tobacco abuse - He is trying to quit.    Medication Adjustments/Labs and Tests Ordered: Current medicines are reviewed at length with the patient today.  Concerns regarding medicines are outlined above.  Medication changes, Labs and Tests ordered today are outlined  in the Patient Instructions noted  below. Patient Instructions  Medication Instructions:  1. STOP LOSARTAN  2. DO NOT START SPIRONOLACTONE  3. START ENTRESTO 49/51 MG 1 TABLET TWICE DAILY; NEW RX HAS BEEN SENT 4. INCREASE LASIX TO 40 MG DAILY; NEW RX SENT  Labwork: BMET TO BE DONE IN 1 WEEK Testing/Procedures: NONE Follow-Up: 1. YOU ARE BEING REFERRED TO [facility]  2. Jared [name], PAC 2 WEEKS Any Other Special Instructions Will Be Listed Below (If Applicable). If you need a refill on your cardiac medications before your next appointment, please call your pharmacy.  Signed, Tereso Newcomer, PA-C  02/24/2016 1:56 PM    [facility] [address] [place], [place], [place]  [address] Phone: [phone]; Fax: [phone]

## 2016-02-24 ENCOUNTER — Encounter: Payer: Self-pay | Admitting: Physician Assistant

## 2016-02-24 ENCOUNTER — Ambulatory Visit (INDEPENDENT_AMBULATORY_CARE_PROVIDER_SITE_OTHER): Payer: Commercial Managed Care - HMO | Admitting: Physician Assistant

## 2016-02-24 VITALS — BP 170/100 | HR 98 | Ht 75.0 in | Wt 182.8 lb

## 2016-02-24 DIAGNOSIS — I5042 Chronic combined systolic (congestive) and diastolic (congestive) heart failure: Secondary | ICD-10-CM | POA: Diagnosis not present

## 2016-02-24 DIAGNOSIS — I2510 Atherosclerotic heart disease of native coronary artery without angina pectoris: Secondary | ICD-10-CM

## 2016-02-24 DIAGNOSIS — I420 Dilated cardiomyopathy: Secondary | ICD-10-CM

## 2016-02-24 DIAGNOSIS — I110 Hypertensive heart disease with heart failure: Secondary | ICD-10-CM | POA: Diagnosis not present

## 2016-02-24 DIAGNOSIS — I429 Cardiomyopathy, unspecified: Secondary | ICD-10-CM

## 2016-02-24 DIAGNOSIS — Z720 Tobacco use: Secondary | ICD-10-CM

## 2016-02-24 DIAGNOSIS — J189 Pneumonia, unspecified organism: Secondary | ICD-10-CM

## 2016-02-24 MED ORDER — POTASSIUM CHLORIDE CRYS ER 20 MEQ PO TBCR: 20.0000 meq | EXTENDED_RELEASE_TABLET | Freq: Every day | ORAL | 3 refills | Status: DC

## 2016-02-24 MED ORDER — SACUBITRIL-VALSARTAN 49-51 MG PO TABS: 1.0000 | ORAL_TABLET | Freq: Two times a day (BID) | ORAL | 3 refills | Status: DC

## 2016-02-24 MED ORDER — FUROSEMIDE 40 MG PO TABS: 40.0000 mg | ORAL_TABLET | Freq: Every day | ORAL | 3 refills | Status: DC

## 2016-02-24 MED FILL — POTASSIUM CL ER 20 MEQ TAB: 20 | 30 days supply | Qty: 30 | Fill #0

## 2016-02-24 MED FILL — ENTRESTO 49 MG-51 MG TABLET: 49-51 | 30 days supply | Qty: 60 | Fill #0

## 2016-02-24 NOTE — Patient Instructions (Addendum)
Medication Instructions:  1. STOP LOSARTAN  2. DO NOT START SPIRONOLACTONE  3. START ENTRESTO 49/51 MG 1 TABLET TWICE DAILY; NEW RX HAS BEEN SENT 4. INCREASE LASIX TO 40 MG DAILY; NEW RX SENT  5. START POTASSIUM 20 MEQ DAILY;   Labwork: BMET TO BE DONE IN 1 WEEK Testing/Procedures: NONE Follow-Up: 1. YOU ARE BEING REFERRED TO [facility]  2. SCOTT WEAVER, PAC 2 WEEKS Any Other Special Instructions Will Be Listed Below (If Applicable). If you need a refill on your cardiac medications before your next appointment, please call your pharmacy.

## 2016-02-27 MED FILL — FUROSEMIDE 20 MG TABLET: 20 | 30 days supply | Qty: 60 | Fill #0

## 2016-02-29 ENCOUNTER — Encounter: Payer: Self-pay | Admitting: Internal Medicine

## 2016-02-29 ENCOUNTER — Ambulatory Visit (INDEPENDENT_AMBULATORY_CARE_PROVIDER_SITE_OTHER): Payer: Commercial Managed Care - HMO | Admitting: Internal Medicine

## 2016-02-29 VITALS — BP 162/84 | HR 69 | Ht 75.0 in | Wt 177.8 lb

## 2016-02-29 DIAGNOSIS — J189 Pneumonia, unspecified organism: Secondary | ICD-10-CM | POA: Diagnosis not present

## 2016-02-29 DIAGNOSIS — F17210 Nicotine dependence, cigarettes, uncomplicated: Secondary | ICD-10-CM

## 2016-02-29 DIAGNOSIS — J449 Chronic obstructive pulmonary disease, unspecified: Secondary | ICD-10-CM | POA: Diagnosis not present

## 2016-02-29 NOTE — Assessment & Plan Note (Signed)

## 2016-02-29 NOTE — Patient Instructions (Addendum)
Plan A = Automatic =  Symbicort 160 Take 2 puffs first thing in am and then another 2 puffs about 12 hours later.   Work on inhaler technique:  relax and gently blow all the way out then take a nice smooth deep breath back in, triggering the inhaler at same time you start breathing in.  Hold for up to 5 seconds if you can. Blow out thru nose. Rinse and gargle with water when done  Plan B = Backup Only use your albuterol as a rescue medication to be used if you can't catch your breath by resting or doing a relaxed purse lip breathing pattern.  - The less you use it, the better it will work when you need it. - Ok to use the inhaler up to 2 puffs  every 4 hours if you must but call for appointment if use goes up over your usual need - Don't leave home without it !!  (think of it like the spare tire for your car)   Plan C = Crisis - only use your albuterol nebulizer if you first try Plan B and it fails to help > ok to use the nebulizer up to every 4 hours but if start needing it regularly call for immediate appointment    Plan D = Doctor - call me if B and C not adequate  Plan E = ER - go to ER or call 911 if all else fails    The key is to stop smoking completely before smoking completely stops you!   Please schedule a follow up office visit in 2 weeks, sooner if needed with cxr on return

## 2016-02-29 NOTE — Assessment & Plan Note (Signed)
Dx 02/20/16 ER > Levaquin > repeat cxr due 1st week in Oct 2017   Assoc with low grade hemoptysis but clearly with air bronchograms this is CAP and not post osbst pna  rec f/u with cxr in 2 weeks, no more abx

## 2016-02-29 NOTE — Progress Notes (Signed)
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 Follow up with PCP in 1-2 weeks 2. Please obtain BMP/CBC in one week   Home Health:NO Equipment/Devices: NONE  Discharge Condition:Stable CODE STATUS:FULL Diet recommendation: Heart Healthy   Brief/Interim Summary: 60 y.o.malewith medical history significant for CABG, hypertension, prior history of stroke without residual, systolic heart failure, current tobacco abuse, chronic headaches, presenting to the emergency department with generalized weakness, and increased shortness of breath, along with white sputum production. He also reports dyspnea on exertion. In addition, he reports epigastric abdominal pain, which may radiate to the lower portion of his sternum versus chest pain . He denies any lower extremity edema. Admits to salt rich food ingestion .The symptoms are similar to those of one week ago, at which time, workup was suspicious for acute exacerbation of systolic heart failure. She was started on Cozaar, metoprolol and Lasix 20 mg daily at the time, and was recommended to see cardiology, which he failed to do so. His cardiologist is Dr. Dietrich PatesPaula Ross, last seen in December 2016.the patient continues to smoke 1 pack a day of cigarettes  Discharge Diagnoses:  Acute respiratory failure secondary to CHF exacerbation -O2 sats upon admission 89% -CXR unremarkable for infection -Responded well to IVlasix-->po lasix -Weaned off of O2-SpO2 95% on room air  Acute combined systolic/diastolic heart failure -BNP 2056.8 -Echocardiogram 09/20/2014 showed EF  40-45% -Transition to by mouth Lasix 20 mg daily -Echocardiogram 01/10/2016: EF 35-40%, diastolic dysfunction --AST 147, ALT 110 upon admission -Currently trending downward -hepatitis panel unremarkable -Patient denies drug/alcohol use -switch metoprolol-->coreg -continue losartan  Essential Hypertension -Has not been taking home medications for over a year due to lack of insurance and monetary funds -Restarted on losartan, Lasix --switch metoprolol-->coreg 6.25 mg bid  CAD -Currently no chest pain -Continue coreg, losartan, ASA 81 mg daily -LDL 80  Tobacco Abuse -Smoking cessation discussed -Continue nicotine patch  Moderate malnutrition -Nutrition consult    02/29/2016 1st [facility] [facility] office visit/ Wert  symb 160 2bid/ bid saba (not prn)  Chief Complaint  Patient presents with  . Pulmonary Consult    Pt referred by Tereso NewcomerScott Weaver, PA, for cough, hemoptysis, and emphysema. Pt c/o hemoptysis x 3 weeks, pt states this occurs daily but has been improving. Pt c/o increase in SOB with activity and rest.   acute  onset epistaxis/ hemoptyis end of Auguest > ER eval 02/20/16 dx RLL pna by CTa chest > rx levaquin/ prednisone and minimal hemoptysis now  Doe = MMRC2 = can't walk a nl pace on a flat grade s sob but does fine slow and flat eg [employer] shopping   No obvious day to day or daytime variability or presently assoc excess/ purulent sputum or mucus plugs  or cp or chest tightness, subjective wheeze or overt sinus or hb symptoms. No unusual exp hx or h/o childhood pna/ asthma or knowledge of premature birth.  Sleeping ok without nocturnal  or early am exacerbation  of respiratory  c/o's or need for noct saba. Also denies any obvious fluctuation of symptoms with weather or  environmental changes or other aggravating or alleviating factors except as outlined above   Current Medications, Allergies, Complete Past Medical History, Past Surgical History, Family History, and Social  History were reviewed in [employer] record.         Review of Systems  Constitutional: Negative for fever and unexpected weight change.  HENT: Positive for congestion. Negative for dental problem, ear pain, nosebleeds, postnasal drip, rhinorrhea, sinus pressure, sneezing, sore throat and trouble swallowing.   Eyes: Negative for redness and itching.  Respiratory: Positive for cough. Negative for chest tightness, shortness of breath and wheezing.   Cardiovascular: Negative for palpitations and leg swelling.  Gastrointestinal: Negative for nausea and vomiting.  Genitourinary: Negative for dysuria.  Musculoskeletal: Negative for joint swelling.  Skin: Negative for rash.  Neurological: Negative for headaches.  Hematological: Does not bruise/bleed easily.  Psychiatric/Behavioral: Negative for dysphoric mood. The patient is not nervous/anxious.        Objective:   Physical Exam  amb bm nad > stated age   Wt Readings from Last 3 Encounters:  02/29/16 177 lb 12.8 oz (80.6 kg)  02/24/16 182 lb 12.8 oz (82.9 kg)  02/20/16 175 lb (79.4 kg)    Vital signs reviewed - sats 93% on RA on arrival    HEENT: nl   oropharynx. Nl external ear canals without cough reflex -  Poor dentition/ moderate bilateral non-specific turbinate edema     NECK :  without JVD/Nodes/TM/ nl carotid upstrokes bilaterally   LUNGS: no acc muscle use,  slt barrel  contour chest  With distant bs/ minimal bilateral exp rhonchi   CV:  RRR  no s3 or murmur or increase in P2, no edema   ABD:  soft and nontender with end inspiratory hoover's in the supine position. No bruits or organomegaly, bowel sounds nl  MS:  Nl gait/ ext warm without deformities, calf tenderness, cyanosis or clubbing No obvious joint restrictions   SKIN: warm and dry without lesions    NEURO:  alert, approp, nl sensorium with  no motor deficits     . I personally reviewed images and agree with radiology  impression as follows:  CTa Chest   02/20/16  1. The study is of excellent technical quality. No pulmonary embolus is noted. 2. Atherosclerotic calcifications thoracic aorta and coronary arteries. Borderline cardiomegaly is noted. 3. There is infiltrate with air bronchogram in right lower lobe posteriorly. Bronchial mucous plugging noted in right lower lobe. Small patchy infiltrate in left lower lobe posteriorly. Follow-up to resolution after appropriate treatment is recommended. 4. Bilateral emphysematous changes are noted.  No pulmonary edema. Mild bronchitic changes bilateral lower lobe.         Assessment & Plan:

## 2016-02-29 NOTE — Assessment & Plan Note (Signed)
DDX of  difficult airways management almost all start with A and  include Adherence, Ace Inhibitors, Acid Reflux, Active Sinus Disease, Alpha 1 Antitripsin deficiency, Anxiety masquerading as Airways dz,  ABPA,  Allergy(esp in young), Aspiration (esp in elderly), Adverse effects of meds,  Active smokers, A bunch of PE's (a small clot burden can't cause this syndrome unless there is already severe underlying pulm or vascular dz with poor reserve) plus two Bs  = Bronchiectasis and Beta blocker use..and one C= CHF   Adherence is always the initial "prime suspect" and is a multilayered concern that requires a "trust but verify" approach in every patient - starting with knowing how to use medications, especially inhalers, correctly, keeping up with refills and understanding the fundamental difference between maintenance and prns vs those medications only taken for a very short course and then stopped and not refilled.  - - The proper method of use, as well as anticipated side effects, of a metered-dose inhaler are discussed and demonstrated to the patient. Improved effectiveness after extensive coaching during this visit to a level of approximately 75 % from a baseline of 25  % > continue symbicort 160 2bid but change saba to q4h prn    Active smoking greatest concern (see separate a/p)   ? CHF component > rx per cards   Total time devoted to counseling  = 35/80m review case with pt/ discussion of options/alternatives/ personally creating written instructions  in presence of pt  then going over those specific  Instructions directly with the pt including how to use all of the meds but in particular covering each new medication in detail and the difference between the maintenance/automatic meds and the prns using an action plan format for the latter.

## 2016-03-02 ENCOUNTER — Telehealth: Payer: Self-pay | Admitting: *Deleted

## 2016-03-02 ENCOUNTER — Other Ambulatory Visit: Payer: Commercial Managed Care - HMO | Admitting: *Deleted

## 2016-03-02 DIAGNOSIS — I5042 Chronic combined systolic (congestive) and diastolic (congestive) heart failure: Secondary | ICD-10-CM

## 2016-03-02 LAB — BASIC METABOLIC PANEL
BUN: 9 mg/dL (ref 7–25)
CO2: 31 mmol/L (ref 20–31)
Calcium: 9.1 mg/dL (ref 8.6–10.3)
Chloride: 103 mmol/L (ref 98–110)
Creat: 1.04 mg/dL (ref 0.70–1.25)
Glucose, Bld: 95 mg/dL (ref 65–99)
Potassium: 3.4 mmol/L — ABNORMAL LOW (ref 3.5–5.3)
Sodium: 145 mmol/L (ref 135–146)

## 2016-03-02 MED ORDER — POTASSIUM CHLORIDE CRYS ER 20 MEQ PO TBCR
40.0000 meq | EXTENDED_RELEASE_TABLET | Freq: Every day | ORAL | 3 refills | Status: DC
Start: 2016-03-02 — End: 2016-05-09

## 2016-03-02 NOTE — Telephone Encounter (Signed)
Ok to leave detailed message on machine. I lmom K+ low, per DR. Graciela Husbands (DOD) to increase K+ from 20 meq to 40 meq daily, bmet in 1 week. Pt has appt with Tereso Newcomer, PA 10/2, can get bmet at that time if need be. I advised on machine if pt has any questions about my message he may call the office # and s/w the on-call provider for clarification. I was clear in stating that he needs to start taking 2 tablets of the K+ each day to = 40 meq daily.

## 2016-03-05 ENCOUNTER — Telehealth: Payer: Self-pay | Admitting: *Deleted

## 2016-03-05 ENCOUNTER — Telehealth: Payer: Self-pay

## 2016-03-05 NOTE — Telephone Encounter (Signed)
Prior auth obtained for Entresto 49-51 from [employer] [facility]. PA- [id number]. Approved through 02/09/2017.

## 2016-03-05 NOTE — Telephone Encounter (Signed)
I was able to reach pt today by phone. I first confirmed that he di receive my detailed message on Friday 9/22 about K+ low and to increase K+ to 40 meq daily, bmet 10/2. Pt verified all this for with vebal understanding.

## 2016-03-12 ENCOUNTER — Ambulatory Visit: Payer: Commercial Managed Care - HMO | Admitting: Physician Assistant

## 2016-03-15 ENCOUNTER — Ambulatory Visit: Payer: Commercial Managed Care - HMO | Admitting: Internal Medicine

## 2016-03-20 ENCOUNTER — Ambulatory Visit: Payer: Commercial Managed Care - HMO | Attending: Family Medicine

## 2016-05-04 ENCOUNTER — Encounter (HOSPITAL_COMMUNITY): Payer: Self-pay

## 2016-05-04 ENCOUNTER — Emergency Department (HOSPITAL_COMMUNITY): Payer: Self-pay

## 2016-05-04 ENCOUNTER — Inpatient Hospital Stay (HOSPITAL_COMMUNITY)
Admission: EM | Admit: 2016-05-04 | Discharge: 2016-05-09 | DRG: 204 | Disposition: A | Discharge status: Home / Self Care | Payer: Self-pay | Attending: Internal Medicine | Admitting: Internal Medicine

## 2016-05-04 DIAGNOSIS — Z9889 Other specified postprocedural states: Secondary | ICD-10-CM

## 2016-05-04 DIAGNOSIS — Z79899 Other long term (current) drug therapy: Secondary | ICD-10-CM

## 2016-05-04 DIAGNOSIS — Z7951 Long term (current) use of inhaled steroids: Secondary | ICD-10-CM

## 2016-05-04 DIAGNOSIS — I5022 Chronic systolic (congestive) heart failure: Secondary | ICD-10-CM | POA: Diagnosis present

## 2016-05-04 DIAGNOSIS — R935 Abnormal findings on diagnostic imaging of other abdominal regions, including retroperitoneum: Secondary | ICD-10-CM | POA: Diagnosis present

## 2016-05-04 DIAGNOSIS — Z8249 Family history of ischemic heart disease and other diseases of the circulatory system: Secondary | ICD-10-CM

## 2016-05-04 DIAGNOSIS — Z7982 Long term (current) use of aspirin: Secondary | ICD-10-CM

## 2016-05-04 DIAGNOSIS — I420 Dilated cardiomyopathy: Secondary | ICD-10-CM

## 2016-05-04 DIAGNOSIS — R918 Other nonspecific abnormal finding of lung field: Secondary | ICD-10-CM | POA: Diagnosis present

## 2016-05-04 DIAGNOSIS — Z8673 Personal history of transient ischemic attack (TIA), and cerebral infarction without residual deficits: Secondary | ICD-10-CM

## 2016-05-04 DIAGNOSIS — J440 Chronic obstructive pulmonary disease with acute lower respiratory infection: Secondary | ICD-10-CM | POA: Diagnosis present

## 2016-05-04 DIAGNOSIS — I110 Hypertensive heart disease with heart failure: Secondary | ICD-10-CM | POA: Diagnosis present

## 2016-05-04 DIAGNOSIS — J432 Centrilobular emphysema: Secondary | ICD-10-CM | POA: Diagnosis present

## 2016-05-04 DIAGNOSIS — I5042 Chronic combined systolic (congestive) and diastolic (congestive) heart failure: Secondary | ICD-10-CM | POA: Diagnosis present

## 2016-05-04 DIAGNOSIS — J189 Pneumonia, unspecified organism: Secondary | ICD-10-CM | POA: Diagnosis present

## 2016-05-04 DIAGNOSIS — Z809 Family history of malignant neoplasm, unspecified: Secondary | ICD-10-CM

## 2016-05-04 DIAGNOSIS — I2510 Atherosclerotic heart disease of native coronary artery without angina pectoris: Secondary | ICD-10-CM | POA: Diagnosis present

## 2016-05-04 DIAGNOSIS — J441 Chronic obstructive pulmonary disease with (acute) exacerbation: Secondary | ICD-10-CM | POA: Diagnosis present

## 2016-05-04 DIAGNOSIS — J181 Lobar pneumonia, unspecified organism: Secondary | ICD-10-CM

## 2016-05-04 DIAGNOSIS — R042 Hemoptysis: Principal | ICD-10-CM | POA: Diagnosis present

## 2016-05-04 DIAGNOSIS — Z888 Allergy status to other drugs, medicaments and biological substances status: Secondary | ICD-10-CM

## 2016-05-04 DIAGNOSIS — I429 Cardiomyopathy, unspecified: Secondary | ICD-10-CM

## 2016-05-04 DIAGNOSIS — F17210 Nicotine dependence, cigarettes, uncomplicated: Secondary | ICD-10-CM | POA: Diagnosis present

## 2016-05-04 DIAGNOSIS — I10 Essential (primary) hypertension: Secondary | ICD-10-CM | POA: Diagnosis present

## 2016-05-04 LAB — HEPATIC FUNCTION PANEL
ALT: 46 U/L (ref 17–63)
AST: 59 U/L — ABNORMAL HIGH (ref 15–41)
Albumin: 3.5 g/dL (ref 3.5–5.0)
Alkaline Phosphatase: 69 U/L (ref 38–126)
Bilirubin, Direct: 0.3 mg/dL (ref 0.1–0.5)
Indirect Bilirubin: 0.6 mg/dL (ref 0.3–0.9)
Total Bilirubin: 0.9 mg/dL (ref 0.3–1.2)
Total Protein: 6 g/dL — ABNORMAL LOW (ref 6.5–8.1)

## 2016-05-04 LAB — BASIC METABOLIC PANEL
Anion gap: 8 (ref 5–15)
BUN: 15 mg/dL (ref 6–20)
CO2: 31 mmol/L (ref 22–32)
Calcium: 9.2 mg/dL (ref 8.9–10.3)
Chloride: 106 mmol/L (ref 101–111)
Creatinine, Ser: 1.21 mg/dL (ref 0.61–1.24)
GFR calc Af Amer: >60 mL/min (ref >60)
GFR calc non Af Amer: >60 mL/min (ref >60)
Glucose, Bld: 99 mg/dL (ref 65–99)
Potassium: 4 mmol/L (ref 3.5–5.1)
Sodium: 145 mmol/L (ref 135–145)

## 2016-05-04 LAB — I-STAT TROPONIN, ED: Troponin i, poc: 0.06 ng/mL (ref 0.00–0.08)

## 2016-05-04 LAB — CBC
HCT: 49.6 % (ref 39.0–52.0)
Hemoglobin: 16.1 g/dL (ref 13.0–17.0)
MCH: 30.4 pg (ref 26.0–34.0)
MCHC: 32.5 g/dL (ref 30.0–36.0)
MCV: 93.8 fL (ref 78.0–100.0)
Platelets: 183 10*3/uL (ref 150–400)
RBC: 5.29 MIL/uL (ref 4.22–5.81)
RDW: 13.8 % (ref 11.5–15.5)
WBC: 7.6 10*3/uL (ref 4.0–10.5)

## 2016-05-04 LAB — LIPASE, BLOOD: Lipase: 28 U/L (ref 11–51)

## 2016-05-04 MED ORDER — ACETAMINOPHEN 325 MG PO TABS
650.0000 mg | ORAL_TABLET | Freq: Four times a day (QID) | ORAL | Status: DC | PRN
  Administered 2016-05-05 (×2): 650 mg via ORAL
  Filled 2016-05-04 (×2): qty 2

## 2016-05-04 MED ORDER — ONDANSETRON HCL 4 MG PO TABS: 4.0000 mg | ORAL_TABLET | Freq: Four times a day (QID) | ORAL | Status: DC | PRN

## 2016-05-04 MED ORDER — IPRATROPIUM-ALBUTEROL 0.5-2.5 (3) MG/3ML IN SOLN
3.0000 mL | Freq: Four times a day (QID) | RESPIRATORY_TRACT | Status: DC
  Administered 2016-05-05 (×2): 3 mL via RESPIRATORY_TRACT
  Filled 2016-05-04 (×3): qty 3

## 2016-05-04 MED ORDER — NICOTINE 14 MG/24HR TD PT24
14.0000 mg | MEDICATED_PATCH | Freq: Every day | TRANSDERMAL | Status: DC
  Administered 2016-05-04 – 2016-05-09 (×6): 14 mg via TRANSDERMAL
  Filled 2016-05-04 (×7): qty 1

## 2016-05-04 MED ORDER — METHYLPREDNISOLONE SODIUM SUCC 125 MG IJ SOLR
60.0000 mg | Freq: Four times a day (QID) | INTRAMUSCULAR | Status: DC
  Administered 2016-05-04 – 2016-05-05 (×3): 60 mg via INTRAVENOUS
  Filled 2016-05-04 (×3): qty 2

## 2016-05-04 MED ORDER — GUAIFENESIN ER 600 MG PO TB12
600.0000 mg | ORAL_TABLET | Freq: Two times a day (BID) | ORAL | Status: DC
Start: 2016-05-04 — End: 2016-05-09
  Administered 2016-05-04 – 2016-05-09 (×9): 600 mg via ORAL
  Filled 2016-05-04 (×11): qty 1

## 2016-05-04 MED ORDER — CARVEDILOL 6.25 MG PO TABS
6.2500 mg | ORAL_TABLET | Freq: Two times a day (BID) | ORAL | Status: DC
  Administered 2016-05-05 – 2016-05-09 (×8): 6.25 mg via ORAL
  Filled 2016-05-04 (×10): qty 1

## 2016-05-04 MED ORDER — ALBUTEROL (5 MG/ML) CONTINUOUS INHALATION SOLN
10.0000 mg/h | INHALATION_SOLUTION | RESPIRATORY_TRACT | Status: DC
Start: 2016-05-04 — End: 2016-05-04
  Administered 2016-05-04: 10 mg/h via RESPIRATORY_TRACT
  Filled 2016-05-04: qty 20

## 2016-05-04 MED ORDER — ASPIRIN EC 81 MG PO TBEC
81.0000 mg | DELAYED_RELEASE_TABLET | Freq: Every day | ORAL | Status: DC
  Administered 2016-05-05 – 2016-05-09 (×5): 81 mg via ORAL
  Filled 2016-05-04 (×6): qty 1

## 2016-05-04 MED ORDER — DEXTROSE 5 % IV SOLN
1.0000 g | Freq: Once | INTRAVENOUS | Status: AC
  Administered 2016-05-04: 1 g via INTRAVENOUS
  Filled 2016-05-04: qty 10

## 2016-05-04 MED ORDER — SACUBITRIL-VALSARTAN 49-51 MG PO TABS
1.0000 | ORAL_TABLET | Freq: Two times a day (BID) | ORAL | Status: DC
  Administered 2016-05-04 – 2016-05-09 (×9): 1 via ORAL
  Filled 2016-05-04 (×10): qty 1

## 2016-05-04 MED ORDER — ENOXAPARIN SODIUM 40 MG/0.4ML ~~LOC~~ SOLN
40.0000 mg | SUBCUTANEOUS | Status: DC
  Administered 2016-05-05 – 2016-05-07 (×3): 40 mg via SUBCUTANEOUS
  Filled 2016-05-04 (×3): qty 0.4

## 2016-05-04 MED ORDER — DEXTROSE 5 % IV SOLN
500.0000 mg | Freq: Once | INTRAVENOUS | Status: AC
  Administered 2016-05-04: 500 mg via INTRAVENOUS
  Filled 2016-05-04: qty 500

## 2016-05-04 MED ORDER — METHYLPREDNISOLONE SODIUM SUCC 125 MG IJ SOLR
125.0000 mg | Freq: Once | INTRAMUSCULAR | Status: AC
  Administered 2016-05-04: 125 mg via INTRAVENOUS
  Filled 2016-05-04: qty 2

## 2016-05-04 MED ORDER — DEXTROSE 5 % IV SOLN
1.0000 g | INTRAVENOUS | Status: DC
  Administered 2016-05-05 – 2016-05-08 (×4): 1 g via INTRAVENOUS
  Filled 2016-05-04 (×5): qty 10

## 2016-05-04 MED ORDER — FUROSEMIDE 40 MG PO TABS
40.0000 mg | ORAL_TABLET | Freq: Every day | ORAL | Status: DC
  Administered 2016-05-05 – 2016-05-09 (×5): 40 mg via ORAL
  Filled 2016-05-04 (×8): qty 1

## 2016-05-04 MED ORDER — ONDANSETRON HCL 4 MG/2ML IJ SOLN: 4.0000 mg | Freq: Four times a day (QID) | INTRAMUSCULAR | Status: DC | PRN

## 2016-05-04 MED ORDER — MOMETASONE FURO-FORMOTEROL FUM 200-5 MCG/ACT IN AERO
2.0000 | INHALATION_SPRAY | Freq: Two times a day (BID) | RESPIRATORY_TRACT | Status: DC
  Administered 2016-05-05 – 2016-05-09 (×6): 2 via RESPIRATORY_TRACT
  Filled 2016-05-04 (×2): qty 8.8

## 2016-05-04 MED ORDER — DEXTROSE 5 % IV SOLN
500.0000 mg | INTRAVENOUS | Status: DC
  Filled 2016-05-04: qty 500

## 2016-05-04 MED ORDER — ACETAMINOPHEN 650 MG RE SUPP
650.0000 mg | Freq: Four times a day (QID) | RECTAL | Status: DC | PRN
Start: 2016-05-04 — End: 2016-05-09

## 2016-05-04 MED ORDER — SODIUM CHLORIDE 0.9 % IV SOLN
INTRAVENOUS | Status: DC
  Administered 2016-05-04: 22:00:00 via INTRAVENOUS

## 2016-05-04 MED ORDER — POTASSIUM CHLORIDE CRYS ER 20 MEQ PO TBCR
40.0000 meq | EXTENDED_RELEASE_TABLET | Freq: Every day | ORAL | Status: DC
  Administered 2016-05-05 – 2016-05-09 (×5): 40 meq via ORAL
  Filled 2016-05-04 (×6): qty 2

## 2016-05-04 NOTE — ED Notes (Signed)
Pt transported to US

## 2016-05-04 NOTE — ED Notes (Signed)
Vascular tech contacted RN to inform delayed at this time.

## 2016-05-04 NOTE — H&P (Signed)
TRH H&P    Patient Demographics:    Jared [name], is a 60 y.o. male  MRN: [id number]  DOB - [birth year]  Admit Date - 05/04/2016  Referring MD/NP/PA: Eber Hong  Outpatient Primary MD for the patient is Jaclyn Shaggy, MD  Patient coming from: Home  Chief Complaint  Patient presents with  . Shortness of Breath  . Hemoptysis      HPI:    Jared [name]  is a 60 y.o. male, With history of nonischemic or dermopathy, EF 30%, on diuretic therapy with Lasix at home, COPD who came to the hospital with worsening shortness of breath with coughing up blood-streaked sputum for past 2 days. Patient has had multiple pneumonia in the past in September 2017 CT scan showed infiltrate in right lower lobe, at that time he was seen by [facility] as outpatient and was prescribed Levaquin.  In the ED chest x-ray again showed right lower lobe infiltrate, normal WBC count. Patient started on ceftriaxone and Zithromax.    Review of systems:    In addition to the HPI above,  No Fever-chills, No Headache, No changes with Vision or hearing, No problems swallowing food or Liquids,  No Abdominal pain, No Nausea or Vomiting, bowel movements are regular, No Blood in stool or Urine, No dysuria, No new skin rashes or bruises, No new joints pains-aches,  No new weakness, tingling, numbness in any extremity, No recent weight gain or loss, No polyuria, polydypsia or polyphagia, No significant Mental Stressors.  A full 10 point Review of Systems was done, except as stated above, all other Review of Systems were negative.   With Past History of the following :    Past Medical History:  Diagnosis Date  . CAD (coronary artery disease)    a. LHC 5/12:  LAD 20, pLCx 20, pRCA 40, dRCA 40, EF 35%, diff HK  //  b. Myoview 4/16: Overall Impression: High risk stress nuclear study There is no evidence of ischemia. There is severe LV  dysfunction. LV Ejection Fraction: 30%. LV Wall Motion: There is global LV hypokinesis.   Marland Kitchen CAP (community acquired pneumonia) 09/2013  . Chronic combined systolic and diastolic CHF (congestive heart failure) (HCC)    a. Echo 4/16:Mild LVH, EF 40-45%, diffuse HK //  b. Echo 8/17: EF 35-40%, diffuse HK, diastolic dysfunction, aortic sclerosis, trivial MR, moderate LAE, normal RVSF, moderate RAE, mild TR, PASP 42 mmHg  . Cluster headache    "hx; haven't had one in awhile" (01/09/2016)  . COPD (chronic obstructive pulmonary disease) (HCC)    [name] 01/09/2016  . History of CVA (cerebrovascular accident)   . Hypertension   . NICM (nonischemic cardiomyopathy) (HCC)   . Tobacco abuse       Past Surgical History:  Procedure Laterality Date  . CARDIAC CATHETERIZATION  10/2010   LM normal, LAD with 20% irregularities, LCX with 20%, RCA with 40% prox and 40% distal - EF of 35%  . COLONOSCOPY W/ BIOPSIES AND POLYPECTOMY        Social History:  Social History  Substance Use Topics  . Smoking status: Current Every Day Smoker    Packs/day: 1.50    Years: 42.00    Types: Cigarettes  . Smokeless tobacco: Never Used  . Alcohol use 3.6 oz/week    6 Glasses of wine per week     Comment: 01/09/2016 "1/5th (of wine mostly) over the weekend"       Family History :     Family History  Problem Relation Age of Onset  . Cancer Mother   . Heart disease Mother   . Diabetes Mother   . Cancer Father   . Diabetes Father   . Diabetes Sister   . Diabetes Brother       Home Medications:   Prior to Admission medications   Medication Sig Start Date End Date Taking? Authorizing Provider  albuterol (PROVENTIL HFA;VENTOLIN HFA) 108 (90 Base) MCG/ACT inhaler Inhale 2 puffs into the lungs every 6 (six) hours as needed for wheezing or shortness of breath. 02/03/16   Jaclyn Shaggy, MD  aspirin 81 MG EC tablet Take 1 tablet (81 mg total) by mouth daily. 01/16/16   Tiffany Netta Cedars, PA-C    budesonide-formoterol (SYMBICORT) 160-4.5 MCG/ACT inhaler Inhale 2 puffs into the lungs 2 (two) times daily. 02/03/16   Jaclyn Shaggy, MD  carvedilol (COREG) 6.25 MG tablet Take 6.25 mg by mouth 2 (two) times daily with a meal.    Historical Provider, MD  furosemide (LASIX) 40 MG tablet Take 1 tablet (40 mg total) by mouth daily. 02/24/16   Beatrice Lecher, PA-C  potassium chloride SA (K-DUR,KLOR-CON) 20 MEQ tablet Take 2 tablets (40 mEq total) by mouth daily. 03/02/16   Scott Moishe Spice, PA-C  sacubitril-valsartan (ENTRESTO) 49-51 MG Take 1 tablet by mouth 2 (two) times daily. 02/24/16   Beatrice Lecher, PA-C     Allergies:     Allergies  Allergen Reactions  . Lisinopril Cough     Physical Exam:   Vitals  Blood pressure 168/94, pulse 109, temperature 97.7 F (36.5 C), temperature source Oral, resp. rate (!) 28, SpO2 94 %.  1.  General: Appears in no acute distress  2. Psychiatric:  Intact judgement and  insight, awake alert, oriented x 3.  3. Neurologic: No focal neurological deficits, all cranial nerves intact.Strength 5/5 all 4 extremities, sensation intact all 4 extremities, plantars down going.  4. Eyes :  anicteric sclerae, moist conjunctivae with no lid lag. PERRLA.  5. ENMT:  Oropharynx clear with moist mucous membranes and good dentition  6. Neck:  supple, no cervical lymphadenopathy appriciated, No thyromegaly  7. Respiratory : Normal respiratory effort, good air movement bilaterally,clear to  auscultation bilaterally  8. Cardiovascular : RRR, no gallops, rubs or murmurs, no leg edema  9. Gastrointestinal:  Positive bowel sounds, abdomen soft, non-tender to palpation,no hepatosplenomegaly, no rigidity or guarding       10. Skin:  No cyanosis, normal texture and turgor, no rash, lesions or ulcers  11.Musculoskeletal:  Good muscle tone,  joints appear normal , no effusions,  normal range of motion    Data Review:    CBC  Recent Labs Lab 05/04/16 1324   WBC 7.6  HGB 16.1  HCT 49.6  PLT 183  MCV 93.8  MCH 30.4  MCHC 32.5  RDW 13.8   ------------------------------------------------------------------------------------------------------------------  Chemistries   Recent Labs Lab 05/04/16 1324 05/04/16 1810  NA 145  --   K 4.0  --   CL 106  --  CO2 31  --   GLUCOSE 99  --   BUN 15  --   CREATININE 1.21  --   CALCIUM 9.2  --   AST  --  59*  ALT  --  46  ALKPHOS  --  69  BILITOT  --  0.9   ------------------------------------------------------------------------------------------------------------------  ------------------------------------------------------------------------------------------------------------------ GFR: CrCl cannot be calculated (Unknown ideal weight.). Liver Function Tests:  Recent Labs Lab 05/04/16 1810  AST 59*  ALT 46  ALKPHOS 69  BILITOT 0.9  PROT 6.0*  ALBUMIN 3.5    Recent Labs Lab 05/04/16 1810  LIPASE 28   No results for input(s): AMMONIA in the last 168 hours. Coagulation Profile: No results for input(s): INR, PROTIME in the last 168 hours. Cardiac Enzymes: No results for input(s): CKTOTAL, CKMB, CKMBINDEX, TROPONINI in the last 168 hours. BNP (last 3 results) No results for input(s): PROBNP in the last 8760 hours. HbA1C: No results for input(s): HGBA1C in the last 72 hours. CBG: No results for input(s): GLUCAP in the last 168 hours. Lipid Profile: No results for input(s): CHOL, HDL, LDLCALC, TRIG, CHOLHDL, LDLDIRECT in the last 72 hours. Thyroid Function Tests: No results for input(s): TSH, T4TOTAL, FREET4, T3FREE, THYROIDAB in the last 72 hours. Anemia Panel: No results for input(s): VITAMINB12, FOLATE, FERRITIN, TIBC, IRON, RETICCTPCT in the last 72 hours.  --------------------------------------------------------------------------------------------------------------- Urine analysis: No results found for: COLORURINE, APPEARANCEUR, LABSPEC, PHURINE, GLUCOSEU, HGBUR,  BILIRUBINUR, KETONESUR, PROTEINUR, UROBILINOGEN, NITRITE, LEUKOCYTESUR    Imaging Results:    Dg Chest 2 View  Result Date: 05/04/2016 CLINICAL DATA:  Chest pain. EXAM: CHEST  2 VIEW COMPARISON:  CT 02/20/2016.  Chest x-ray 02/20/2016, 02/10/2016. FINDINGS: Mediastinum and hilar structures are normal. Right lower lobe infiltrate noted consistent pneumonia. Small right pleural effusion. No pneumothorax. No acute bony abnormality. IMPRESSION: Right lower lobe infiltrate noted consistent pneumonia. Electronically Signed   By: Maisie Fushomas  Register   On: 05/04/2016 14:09   [place] Abdomen Complete  Result Date: 05/04/2016 CLINICAL DATA:  Acute onset of right upper quadrant abdominal pain. Initial encounter. EXAM: ABDOMEN ULTRASOUND COMPLETE COMPARISON:  Right upper tooth quadrant ultrasound performed 01/09/2016 FINDINGS: Gallbladder: No gallstones seen. Mild gallbladder wall thickening and trace pericholecystic fluid are noted, but no ultrasonographic Murphy's sign is elicited. Common bile duct: Diameter: 0.4 cm, within normal limits in caliber. Liver: No focal lesion identified. Within normal limits in parenchymal echogenicity. IVC: No abnormality visualized. Pancreas: Visualized portion unremarkable. Spleen: Size and appearance within normal limits. Right Kidney: Length: 11.3 cm. Echogenicity within normal limits. No mass or hydronephrosis visualized. Left Kidney: Length: 12.5 cm. Echogenicity within normal limits. No mass or hydronephrosis visualized. Abdominal aorta: No aneurysm visualized. Scattered calcification is noted along the abdominal aorta. Other findings: Trace fluid is seen tracking about the liver and spleen. IMPRESSION: 1. Trace ascites noted tracking about the liver and spleen. 2. Mild gallbladder wall thickening and trace pericholecystic fluid are nonspecific in the presence of ascites. No ultrasonographic Murphy's sign elicited. Gallbladder otherwise unremarkable. 3. Scattered aortic  atherosclerosis. Electronically Signed   By: Roanna RaiderJeffery  Chang M.D.   On: 05/04/2016 19:33    My personal review of EKG: Rhythm NSR,    Assessment & Plan:    Active Problems:   CAP (community acquired pneumonia)   Hypertension   Nonischemic dilated cardiomyopathy (HCC)   Cigarette smoker   Chronic systolic CHF (congestive heart failure) (HCC)   1. Community acquired pneumonia-we'll place under observation, started on ceftriaxone, and Zithromax. Will continue with these  medications, obtain urinary strep pneumo antigen, blood cultures 2, sputum Gram stain and culture. If no improvement consider further consultation as patient has been followed by pulmonary as outpatient. 2. Chronic systolic CHF-appears well compensated at this time, continue with Lasix and potassium chloride tablets. Continue Entresto. 3. Hypertension-continue Coreg,Enteresto. 4. COPD-continue duo nebs every 6 hours   DVT Prophylaxis-   Lovenox   AM Labs Ordered, also please review Full Orders  Family Communication: No family present at bedside  Code Status:  Full code  Admission status: Observation    Time spent in minutes : 60 minutes   Sydne Krahl S M.D on 05/04/2016 at 8:29 PM  Between 7am to 7pm - Pager - [phone]. After 7pm go to [email or web address] - password [facility]  [facility] - Office  [phone]

## 2016-05-04 NOTE — ED Notes (Signed)
Patient remains in U/S at this time.

## 2016-05-04 NOTE — ED Triage Notes (Addendum)
Pt reports shortness of breath and that he has been coughing up blood. Pt is in no acute distress. Pt has significant hx of CHF and COPD. Pt reports mild abd and chest pain. Lung sounds slightly diminished.

## 2016-05-04 NOTE — ED Provider Notes (Signed)
[facility]-EMERGENCY DEPT Provider Note   CSN: [id number] Arrival date & time: 05/04/16  1314     History   Chief Complaint Chief Complaint  Patient presents with  . Shortness of Breath  . Hemoptysis    HPI Jared [name] is a 60 y.o. male.  HPI  The patient is a 60 year old male with a history of COPD, nonischemic cardiomyopathy, history of stroke as well as a history of coronary disease with severe left ventricular systolic dysfunction. His last ejection fraction was 30%. He is also has a history of COPD and has had multiple pneumonias in the past. He reports that 3 days ago started to have coughing, the coughing is been persistent, productive of phlegm and now the sputum is streaked with blood. He has a chest discomfort when he breathes and when he coughs as well as having some lower abdominal pain as well.  Past Medical History:  Diagnosis Date  . CAD (coronary artery disease)    a. LHC 5/12:  LAD 20, pLCx 20, pRCA 40, dRCA 40, EF 35%, diff HK  //  b. Myoview 4/16: Overall Impression: High risk stress nuclear study There is no evidence of ischemia. There is severe LV dysfunction. LV Ejection Fraction: 30%. LV Wall Motion: There is global LV hypokinesis.   Marland Kitchen CAP (community acquired pneumonia) 09/2013  . Chronic combined systolic and diastolic CHF (congestive heart failure) (HCC)    a. Echo 4/16:Mild LVH, EF 40-45%, diffuse HK //  b. Echo 8/17: EF 35-40%, diffuse HK, diastolic dysfunction, aortic sclerosis, trivial MR, moderate LAE, normal RVSF, moderate RAE, mild TR, PASP 42 mmHg  . Cluster headache    "hx; haven't had one in awhile" (01/09/2016)  . COPD (chronic obstructive pulmonary disease) (HCC)    [name] 01/09/2016  . History of CVA (cerebrovascular accident)   . Hypertension   . NICM (nonischemic cardiomyopathy) (HCC)   . Tobacco abuse     Patient Active Problem List   Diagnosis Date Noted  . COPD pfts still smoking  02/29/2016  . Chronic systolic CHF (congestive heart  failure) (HCC)   . Malnutrition of moderate degree 01/10/2016  . Chest pain 01/09/2016  . Cigarette smoker 01/09/2016  . COPD bronchitis 01/15/2014  . Atrial tachycardia, paroxysmal (HCC) 10/07/2013  . CAD (coronary artery disease), native coronary artery 10/07/2013  . 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 COPD with acute exacerbation (HCC) 10/04/2013  . Sinus tachycardia 10/04/2013  . CAP (community acquired pneumonia) 10/03/2013  . Acute respiratory failure (HCC) 10/03/2013  . Hypertension     Past Surgical History:  Procedure Laterality Date  . CARDIAC CATHETERIZATION  10/2010   LM normal, LAD with 20% irregularities, LCX with 20%, RCA with 40% prox and 40% distal - EF of 35%  . COLONOSCOPY W/ BIOPSIES AND POLYPECTOMY         Home Medications    Prior to Admission medications   Medication Sig Start Date End Date Taking? Authorizing Provider  albuterol (PROVENTIL HFA;VENTOLIN HFA) 108 (90 Base) MCG/ACT inhaler Inhale 2 puffs into the lungs every 6 (six) hours as needed for wheezing or shortness of breath. 02/03/16  Yes Jaclyn Shaggy, MD  aspirin 81 MG EC tablet Take 1 tablet (81 mg total) by mouth daily. 01/16/16  Yes Tiffany Netta Cedars, PA-C  budesonide-formoterol (SYMBICORT) 160-4.5 MCG/ACT inhaler Inhale 2 puffs into the lungs 2 (two) times daily. 02/03/16  Yes Jaclyn Shaggy, MD  carvedilol (COREG) 6.25 MG tablet Take 6.25 mg by mouth 2 (two)  times daily with a meal.   Yes Historical Provider, MD  furosemide (LASIX) 40 MG tablet Take 1 tablet (40 mg total) by mouth daily. 02/24/16  Yes Scott T Alben Spittle, PA-C  ibuprofen (ADVIL,MOTRIN) 600 MG tablet Take 600 mg by mouth every 6 (six) hours as needed for headache.   Yes Historical Provider, MD  potassium chloride SA (K-DUR,KLOR-CON) 20 MEQ tablet Take 2 tablets (40 mEq total) by mouth daily. 03/02/16  Yes Scott T Weaver, PA-C  sacubitril-valsartan (ENTRESTO) 49-51 MG Take 1 tablet by mouth 2 (two) times daily. 02/24/16  Yes  Beatrice Lecher, PA-C    Family History Family History  Problem Relation Age of Onset  . Cancer Mother   . Heart disease Mother   . Diabetes Mother   . Cancer Father   . Diabetes Father   . Diabetes Sister   . Diabetes Brother     Social History Social History  Substance Use Topics  . Smoking status: Current Every Day Smoker    Packs/day: 1.50    Years: 42.00    Types: Cigarettes  . Smokeless tobacco: Never Used  . Alcohol use 3.6 oz/week    6 Glasses of wine per week     Comment: 01/09/2016 "1/5th (of wine mostly) over the weekend"     Allergies   Lisinopril   Review of Systems Review of Systems  All other systems reviewed and are negative.    Physical Exam Updated Vital Signs BP 168/94   Pulse 109   Temp 97.7 F (36.5 C) (Oral)   Resp (!) 28   SpO2 94%   Physical Exam  Constitutional: He appears well-developed and well-nourished. No distress.  HENT:  Head: Normocephalic and atraumatic.  Mouth/Throat: Oropharynx is clear and moist. No oropharyngeal exudate.  Eyes: Conjunctivae and EOM are normal. Pupils are equal, round, and reactive to light. Right eye exhibits no discharge. Left eye exhibits no discharge. No scleral icterus.  Neck: Normal range of motion. Neck supple. No JVD present. No thyromegaly present.  Cardiovascular: Normal rate, regular rhythm, normal heart sounds and intact distal pulses.  Exam reveals no gallop and no friction rub.   No murmur heard. Pulmonary/Chest: He is in respiratory distress. He has wheezes ( Diffuse expiratory wheezing with increased work of breathing and mild tachypnea, prolonged expiratory phase). He has rales ( In the right lower lobe). He exhibits no tenderness.  Abdominal: Soft. Bowel sounds are normal. He exhibits no distension and no mass. There is tenderness ( Tenderness to palpation in the right upper quadrant, mild guarding otherwise very soft abdomen).  Musculoskeletal: Normal range of motion. He exhibits no edema  or tenderness.  Lymphadenopathy:    He has no cervical adenopathy.  Neurological: He is alert. Coordination normal.  Skin: Skin is warm and dry. No rash noted. No erythema.  Psychiatric: He has a normal mood and affect. His behavior is normal.  Nursing note and vitals reviewed.     ED Treatments / Results  Labs (all labs ordered are listed, but only abnormal results are displayed) Labs Reviewed  HEPATIC FUNCTION PANEL - Abnormal; Notable for the following:       Result Value   Total Protein 6.0 (*)    AST 59 (*)    All other components within normal limits  CULTURE, BLOOD (ROUTINE X 2)  CULTURE, BLOOD (ROUTINE X 2)  BASIC METABOLIC PANEL  CBC  LIPASE, BLOOD  I-STAT TROPOININ, ED    EKG  EKG Interpretation  Date/Time:  Friday May 04 2016 13:18:39 EST Ventricular Rate:  100 PR Interval:  122 QRS Duration: 94 QT Interval:  366 QTC Calculation: 472 R Axis:   -74 Text Interpretation:  Sinus rhythm with Premature supraventricular complexes Left axis deviation Anterior infarct , age undetermined Abnormal ECG since last tracing no significant change Confirmed by Hyacinth MeekerMILLER  MD, Whyatt Klinger ([id number]) on 05/04/2016 5:05:58 PM       Radiology Dg Chest 2 View  Result Date: 05/04/2016 CLINICAL DATA:  Chest pain. EXAM: CHEST  2 VIEW COMPARISON:  CT 02/20/2016.  Chest x-ray 02/20/2016, 02/10/2016. FINDINGS: Mediastinum and hilar structures are normal. Right lower lobe infiltrate noted consistent pneumonia. Small right pleural effusion. No pneumothorax. No acute bony abnormality. IMPRESSION: Right lower lobe infiltrate noted consistent pneumonia. Electronically Signed   By: Maisie Fushomas  Register   On: 05/04/2016 14:09   [place] Abdomen Complete  Result Date: 05/04/2016 CLINICAL DATA:  Acute onset of right upper quadrant abdominal pain. Initial encounter. EXAM: ABDOMEN ULTRASOUND COMPLETE COMPARISON:  Right upper tooth quadrant ultrasound performed 01/09/2016 FINDINGS: Gallbladder: No gallstones  seen. Mild gallbladder wall thickening and trace pericholecystic fluid are noted, but no ultrasonographic Murphy's sign is elicited. Common bile duct: Diameter: 0.4 cm, within normal limits in caliber. Liver: No focal lesion identified. Within normal limits in parenchymal echogenicity. IVC: No abnormality visualized. Pancreas: Visualized portion unremarkable. Spleen: Size and appearance within normal limits. Right Kidney: Length: 11.3 cm. Echogenicity within normal limits. No mass or hydronephrosis visualized. Left Kidney: Length: 12.5 cm. Echogenicity within normal limits. No mass or hydronephrosis visualized. Abdominal aorta: No aneurysm visualized. Scattered calcification is noted along the abdominal aorta. Other findings: Trace fluid is seen tracking about the liver and spleen. IMPRESSION: 1. Trace ascites noted tracking about the liver and spleen. 2. Mild gallbladder wall thickening and trace pericholecystic fluid are nonspecific in the presence of ascites. No ultrasonographic Murphy's sign elicited. Gallbladder otherwise unremarkable. 3. Scattered aortic atherosclerosis. Electronically Signed   By: Roanna RaiderJeffery  Chang M.D.   On: 05/04/2016 19:33    Procedures Procedures (including critical care time)  Medications Ordered in ED Medications  albuterol (PROVENTIL,VENTOLIN) solution continuous neb (0 mg/hr Nebulization Stopped 05/04/16 1940)  cefTRIAXone (ROCEPHIN) 1 g in dextrose 5 % 50 mL IVPB (0 g Intravenous Stopped 05/04/16 1805)  azithromycin (ZITHROMAX) 500 mg in dextrose 5 % 250 mL IVPB (500 mg Intravenous New Bag/Given 05/04/16 1807)  methylPREDNISolone sodium succinate (SOLU-MEDROL) 125 mg/2 mL injection 125 mg (125 mg Intravenous Given 05/04/16 1731)     Initial Impression / Assessment and Plan / ED Course  I have reviewed the triage vital signs and the nursing notes.  Pertinent labs & imaging results that were available during my care of the patient were reviewed by me and considered in my  medical decision making (see chart for details).  Clinical Course      Review of the medical record shows that the patient has had a CT angiogram in September of this year, this was approximately 2-1/2 months ago during which time he had a CT scan showing air bronchograms and patchy infiltrate in the right lower lobe with no signs of pulmonary embolism. His x-ray today confirms an infiltrate in the same place, I'm concerned with his increased amount of hemoptysis, his hypoxia and his abnormal lung sounds that he'll need to be admitted to the hospital. He will get continuous nebulizer therapy here. Antibiotics. Steroids. He does not appear to have congestive heart failure on exam or clinically as he  has no peripheral or pulmonary edema.  Possible aspiration causing recurrent LL pneumonia -   Continues to use tobacco  Drinks ETOH - occasionally but heavily  R/o chole with RUQ [place].  [place] neg, LFT's normal - pt has been d/w hospitalis who will admit to hospital - appreciate Dr. Sharl Ma and is expedited response and willingness to admit.  Final Clinical Impressions(s) / ED Diagnoses   Final diagnoses:  Community acquired pneumonia of right lower lobe of lung [facility])    New Prescriptions New Prescriptions   No medications on file     Eber Hong, MD 05/04/16 2044

## 2016-05-05 LAB — COMPREHENSIVE METABOLIC PANEL
ALT: 43 U/L (ref 17–63)
AST: 48 U/L — ABNORMAL HIGH (ref 15–41)
Albumin: 3.5 g/dL (ref 3.5–5.0)
Alkaline Phosphatase: 68 U/L (ref 38–126)
Anion gap: 12 (ref 5–15)
BUN: 14 mg/dL (ref 6–20)
CO2: 26 mmol/L (ref 22–32)
Calcium: 8.9 mg/dL (ref 8.9–10.3)
Chloride: 102 mmol/L (ref 101–111)
Creatinine, Ser: 1.13 mg/dL (ref 0.61–1.24)
GFR calc Af Amer: >60 mL/min (ref >60)
GFR calc non Af Amer: >60 mL/min (ref >60)
Glucose, Bld: 222 mg/dL — ABNORMAL HIGH (ref 65–99)
Potassium: 4 mmol/L (ref 3.5–5.1)
Sodium: 140 mmol/L (ref 135–145)
Total Bilirubin: 0.5 mg/dL (ref 0.3–1.2)
Total Protein: 6.5 g/dL (ref 6.5–8.1)

## 2016-05-05 LAB — CBC
HCT: 46.7 % (ref 39.0–52.0)
Hemoglobin: 15.1 g/dL (ref 13.0–17.0)
MCH: 30.4 pg (ref 26.0–34.0)
MCHC: 32.3 g/dL (ref 30.0–36.0)
MCV: 94 fL (ref 78.0–100.0)
Platelets: 163 10*3/uL (ref 150–400)
RBC: 4.97 MIL/uL (ref 4.22–5.81)
RDW: 14.1 % (ref 11.5–15.5)
WBC: 5.7 10*3/uL (ref 4.0–10.5)

## 2016-05-05 MED ORDER — AZITHROMYCIN 500 MG PO TABS
500.0000 mg | ORAL_TABLET | Freq: Every day | ORAL | Status: DC
  Administered 2016-05-05 – 2016-05-09 (×5): 500 mg via ORAL
  Filled 2016-05-05 (×6): qty 1

## 2016-05-05 MED ORDER — METHYLPREDNISOLONE SODIUM SUCC 40 MG IJ SOLR
40.0000 mg | Freq: Three times a day (TID) | INTRAMUSCULAR | Status: DC
  Administered 2016-05-05 – 2016-05-06 (×2): 40 mg via INTRAVENOUS
  Filled 2016-05-05 (×2): qty 1

## 2016-05-05 MED ORDER — IPRATROPIUM-ALBUTEROL 0.5-2.5 (3) MG/3ML IN SOLN
3.0000 mL | Freq: Three times a day (TID) | RESPIRATORY_TRACT | Status: DC
  Administered 2016-05-05 – 2016-05-08 (×9): 3 mL via RESPIRATORY_TRACT
  Filled 2016-05-05 (×11): qty 3

## 2016-05-05 NOTE — Progress Notes (Signed)
PROGRESS NOTE        PATIENT DETAILS Name: Jared [name] Age: 60 y.o. Sex: male Date of Birth: 01/05/1956 Admit Date: 05/04/2016 Admitting Physician Meredeth IdeGagan S Lama, MD ZOX:[id number]:Enobong, Venetia NightAmao, MD  Brief Narrative: Patient is a 60 y.o. male smoker presenting with hemoptysis, found to have right lower lobe infiltrate. Interestingly, had similar symptoms in September and a infiltrate in the right lower lobe then as well. Admitted for further evaluation and treatment  Subjective: Continues to have some scant hemoptysis. Feels essentially the same.  Assessment/Plan: Hemoptysis with right lower lobe infiltrate-due to recurrent PNA: Long-time smoker, chest x-ray shows right lower lobe infiltrate. He had similar echo features approximately 2 months back when he presented to the emergency room with hemoptysis, CT chest then also documented right lower lobe infiltrate. For now continue with empiric antibiotics, await culture data-I have consulted pulmonology-patient may need further workup including a bronchoscopy.  COPD exacerbation: Improving, only a few scattered rhonchi, continue steroids and bronchodilators.  Chronic systolic heart failure (EF 35-40% by TTE on Aug 2017): Although weight is up -he does not appear to be overtly volume overloaded- continue diuretics, Coreg and Entresto  Tobacco abuse: Counseled, continue transdermal nicotine.  DVT Prophylaxis: Prophylactic Lovenox   Code Status: Full code   Family Communication: None at bedside  Disposition Plan: Remain inpatient-home in next 2-3 days  Antimicrobial agents: See below  Procedures: None  CONSULTS:  pulmonary/intensive care  Time spent: 25 minutes-Greater than 50% of this time was spent in counseling, explanation of diagnosis, planning of further management, and coordination of care.  MEDICATIONS: Anti-infectives    Start     Dose/Rate Route Frequency Ordered Stop   05/05/16 1700   azithromycin (ZITHROMAX) 500 mg in dextrose 5 % 250 mL IVPB  Status:  Discontinued     500 mg 250 mL/hr over 60 Minutes Intravenous Every 24 hours 05/04/16 2138 05/05/16 1329   05/05/16 1700  cefTRIAXone (ROCEPHIN) 1 g in dextrose 5 % 50 mL IVPB     1 g 100 mL/hr over 30 Minutes Intravenous Every 24 hours 05/04/16 2142     05/05/16 1700  azithromycin (ZITHROMAX) tablet 500 mg     500 mg Oral Daily 05/05/16 1329     05/04/16 1730  azithromycin (ZITHROMAX) 500 mg in dextrose 5 % 250 mL IVPB     500 mg 250 mL/hr over 60 Minutes Intravenous  Once 05/04/16 1715 05/04/16 2103   05/04/16 1715  cefTRIAXone (ROCEPHIN) 1 g in dextrose 5 % 50 mL IVPB     1 g 100 mL/hr over 30 Minutes Intravenous  Once 05/04/16 1715 05/04/16 1805      Scheduled Meds: . aspirin EC  81 mg Oral Daily  . azithromycin  500 mg Oral Daily  . carvedilol  6.25 mg Oral BID WC  . cefTRIAXone (ROCEPHIN)  IV  1 g Intravenous Q24H  . enoxaparin (LOVENOX) injection  40 mg Subcutaneous Q24H  . furosemide  40 mg Oral Daily  . guaiFENesin  600 mg Oral BID  . ipratropium-albuterol  3 mL Nebulization TID  . methylPREDNISolone (SOLU-MEDROL) injection  60 mg Intravenous Q6H  . mometasone-formoterol  2 puff Inhalation BID  . nicotine  14 mg Transdermal Daily  . potassium chloride SA  40 mEq Oral Daily  . sacubitril-valsartan  1 tablet Oral BID   Continuous Infusions: .  sodium chloride 10 mL/hr at 05/04/16 2149   PRN Meds:.acetaminophen **OR** acetaminophen, ondansetron **OR** ondansetron (ZOFRAN) IV   PHYSICAL EXAM: Vital signs: Vitals:   05/05/16 0539 05/05/16 0540 05/05/16 0947 05/05/16 1404  BP: (!) 155/89     Pulse: 92     Resp: 18     Temp: 98 F (36.7 C)     TempSrc: Oral     SpO2: 100%  96% 95%  Weight:  85.2 kg (187 lb 14.4 oz)    Height:       Filed Weights   05/04/16 2130 05/05/16 0540  Weight: 82.6 kg (182 lb) 85.2 kg (187 lb 14.4 oz)   Body mass index is 23.49 kg/m.   General appearance :Awake,  alert, not in any distress. Speech Clear. Not toxic Looking Eyes:, pupils equally reactive to light and accomodation,no scleral icterus.Pink conjunctiva HEENT: Atraumatic and Normocephalic Neck: supple, no JVD. No cervical lymphadenopathy. No thyromegaly Resp:Good air entry bilaterally, Few scattered rhonchi-some rales at right lung base  CVS: S1 S2 regular GI: Bowel sounds present, Non tender and not distended with no gaurding, rigidity or rebound.No organomegaly Extremities: B/L Lower Ext shows no edema, both legs are warm to touch Neurology:  speech clear,Non focal, sensation is grossly intact. Musculoskeletal:No digital cyanosis Skin:No Rash, warm and dry Wounds:N/A  I have personally reviewed following labs and imaging studies  LABORATORY DATA: CBC:  Recent Labs Lab 05/04/16 1324 05/05/16 0425  WBC 7.6 5.7  HGB 16.1 15.1  HCT 49.6 46.7  MCV 93.8 94.0  PLT 183 163    Basic Metabolic Panel:  Recent Labs Lab 05/04/16 1324 05/05/16 0425  NA 145 140  K 4.0 4.0  CL 106 102  CO2 31 26  GLUCOSE 99 222*  BUN 15 14  CREATININE 1.21 1.13  CALCIUM 9.2 8.9    GFR: Estimated Creatinine Clearance: 81 mL/min (by C-G formula based on SCr of 1.13 mg/dL).  Liver Function Tests:  Recent Labs Lab 05/04/16 1810 05/05/16 0425  AST 59* 48*  ALT 46 43  ALKPHOS 69 68  BILITOT 0.9 0.5  PROT 6.0* 6.5  ALBUMIN 3.5 3.5    Recent Labs Lab 05/04/16 1810  LIPASE 28   No results for input(s): AMMONIA in the last 168 hours.  Coagulation Profile: No results for input(s): INR, PROTIME in the last 168 hours.  Cardiac Enzymes: No results for input(s): CKTOTAL, CKMB, CKMBINDEX, TROPONINI in the last 168 hours.  BNP (last 3 results) No results for input(s): PROBNP in the last 8760 hours.  HbA1C: No results for input(s): HGBA1C in the last 72 hours.  CBG: No results for input(s): GLUCAP in the last 168 hours.  Lipid Profile: No results for input(s): CHOL, HDL, LDLCALC,  TRIG, CHOLHDL, LDLDIRECT in the last 72 hours.  Thyroid Function Tests: No results for input(s): TSH, T4TOTAL, FREET4, T3FREE, THYROIDAB in the last 72 hours.  Anemia Panel: No results for input(s): VITAMINB12, FOLATE, FERRITIN, TIBC, IRON, RETICCTPCT in the last 72 hours.  Urine analysis: No results found for: COLORURINE, APPEARANCEUR, LABSPEC, PHURINE, GLUCOSEU, HGBUR, BILIRUBINUR, KETONESUR, PROTEINUR, UROBILINOGEN, NITRITE, LEUKOCYTESUR  Sepsis Labs: Lactic Acid, Venous    Component Value Date/Time   LATICACIDVEN 1.79 10/03/2013 0621    MICROBIOLOGY: No results found for this or any previous visit (from the past 240 hour(s)).  RADIOLOGY STUDIES/RESULTS: Dg Chest 2 View  Result Date: 05/04/2016 CLINICAL DATA:  Chest pain. EXAM: CHEST  2 VIEW COMPARISON:  CT 02/20/2016.  Chest x-ray 02/20/2016, 02/10/2016.  FINDINGS: Mediastinum and hilar structures are normal. Right lower lobe infiltrate noted consistent pneumonia. Small right pleural effusion. No pneumothorax. No acute bony abnormality. IMPRESSION: Right lower lobe infiltrate noted consistent pneumonia. Electronically Signed   By: Maisie Fus  Register   On: 05/04/2016 14:09   US Abdomen Complete  Result Date: 05/04/2016 CLINICAL DATA:  Acute onset of right upper quadrant abdominal pain. Initial encounter. EXAM: ABDOMEN ULTRASOUND COMPLETE COMPARISON:  Right upper tooth quadrant ultrasound performed 01/09/2016 FINDINGS: Gallbladder: No gallstones seen. Mild gallbladder wall thickening and trace pericholecystic fluid are noted, but no ultrasonographic Murphy's sign is elicited. Common bile duct: Diameter: 0.4 cm, within normal limits in caliber. Liver: No focal lesion identified. Within normal limits in parenchymal echogenicity. IVC: No abnormality visualized. Pancreas: Visualized portion unremarkable. Spleen: Size and appearance within normal limits. Right Kidney: Length: 11.3 cm. Echogenicity within normal limits. No mass or  hydronephrosis visualized. Left Kidney: Length: 12.5 cm. Echogenicity within normal limits. No mass or hydronephrosis visualized. Abdominal aorta: No aneurysm visualized. Scattered calcification is noted along the abdominal aorta. Other findings: Trace fluid is seen tracking about the liver and spleen. IMPRESSION: 1. Trace ascites noted tracking about the liver and spleen. 2. Mild gallbladder wall thickening and trace pericholecystic fluid are nonspecific in the presence of ascites. No ultrasonographic Murphy's sign elicited. Gallbladder otherwise unremarkable. 3. Scattered aortic atherosclerosis. Electronically Signed   By: Roanna Raider M.D.   On: 05/04/2016 19:33     LOS: 0 days   Jeoffrey Massed, MD  [facility] Pager:[phone]  If 7PM-7AM, please contact night-coverage [email or web address] Password [email or web address] 05/05/2016, 2:13 PM

## 2016-05-05 NOTE — Progress Notes (Signed)
At around 9:30pm, pt arrived floor, 5W14. Cardiac monitoring was set up. MD notified. Pt was settled in the room and fed dinner. No complaints. Pt is doing well. Will continue to monitor.

## 2016-05-05 NOTE — Plan of Care (Signed)
Problem: Education: Goal: Knowledge of [facility] Education information/materials will improve Outcome: Progressing Patient asked about pneumonia vaccine. We talked about the importance of receiving the vaccine prior to discharge for wellness promotion.

## 2016-05-06 DIAGNOSIS — F17210 Nicotine dependence, cigarettes, uncomplicated: Secondary | ICD-10-CM

## 2016-05-06 DIAGNOSIS — R042 Hemoptysis: Secondary | ICD-10-CM | POA: Diagnosis present

## 2016-05-06 DIAGNOSIS — J181 Lobar pneumonia, unspecified organism: Secondary | ICD-10-CM

## 2016-05-06 MED ORDER — PREDNISONE 20 MG PO TABS
40.0000 mg | ORAL_TABLET | Freq: Every day | ORAL | Status: DC
  Administered 2016-05-07 – 2016-05-09 (×3): 40 mg via ORAL
  Filled 2016-05-06 (×5): qty 2

## 2016-05-06 NOTE — Progress Notes (Addendum)
PROGRESS NOTE        PATIENT DETAILS Name: Jared [name] Age: 60 y.o. Sex: male Date of Birth: 08/15/1955 Admit Date: 05/04/2016 Admitting Physician Meredeth IdeGagan S Lama, MD [id number]:Enobong, Venetia NightAmao, MD  Brief Narrative: Patient is a 60 y.o. male smoker presenting with hemoptysis, found to have right lower lobe infiltrate. Interestingly, had similar symptoms in September and a infiltrate in the right lower lobe then as well. Admitted for further evaluation and treatment  Subjective: Continues to have some scant hemoptysis.   Assessment/Plan: Hemoptysis with right lower lobe infiltrate-due to recurrent PNA: Long-time smoker, chest x-ray shows right lower lobe infiltrate. He had similar echo features approximately 2 months back when he presented to the emergency room with hemoptysis, CT chest then also documented right lower lobe infiltrate. He appears stable, continues to have some amount of hemoptysis, continue empiric antibiotics, await PCCM  evaluation for possible bronchoscopy/further workup. Blood cultures negative so far.  COPD exacerbation: Much improved-transition to prednisone, continue bronchodilators.  Chronic systolic heart failure (EF 35-40% by TTE on Aug 2017): Although weight is up -he does not appear to be overtly volume overloaded- continue diuretics, Coreg and Entresto  Tobacco abuse: Counseled, continue transdermal nicotine.  DVT Prophylaxis: Prophylactic Lovenox   Code Status: Full code   Family Communication: None at bedside  Disposition Plan: Remain inpatient-home in next 2-3 days  Antimicrobial agents: See below  Procedures: None  CONSULTS:  pulmonary/intensive care  Time spent: 25 minutes-Greater than 50% of this time was spent in counseling, explanation of diagnosis, planning of further management, and coordination of care.  MEDICATIONS: Anti-infectives    Start     Dose/Rate Route Frequency Ordered Stop   05/05/16 1700   azithromycin (ZITHROMAX) 500 mg in dextrose 5 % 250 mL IVPB  Status:  Discontinued     500 mg 250 mL/hr over 60 Minutes Intravenous Every 24 hours 05/04/16 2138 05/05/16 1329   05/05/16 1700  cefTRIAXone (ROCEPHIN) 1 g in dextrose 5 % 50 mL IVPB     1 g 100 mL/hr over 30 Minutes Intravenous Every 24 hours 05/04/16 2142     05/05/16 1700  azithromycin (ZITHROMAX) tablet 500 mg     500 mg Oral Daily 05/05/16 1329     05/04/16 1730  azithromycin (ZITHROMAX) 500 mg in dextrose 5 % 250 mL IVPB     500 mg 250 mL/hr over 60 Minutes Intravenous  Once 05/04/16 1715 05/04/16 2103   05/04/16 1715  cefTRIAXone (ROCEPHIN) 1 g in dextrose 5 % 50 mL IVPB     1 g 100 mL/hr over 30 Minutes Intravenous  Once 05/04/16 1715 05/04/16 1805      Scheduled Meds: . aspirin EC  81 mg Oral Daily  . azithromycin  500 mg Oral Daily  . carvedilol  6.25 mg Oral BID WC  . cefTRIAXone (ROCEPHIN)  IV  1 g Intravenous Q24H  . enoxaparin (LOVENOX) injection  40 mg Subcutaneous Q24H  . furosemide  40 mg Oral Daily  . guaiFENesin  600 mg Oral BID  . ipratropium-albuterol  3 mL Nebulization TID  . methylPREDNISolone (SOLU-MEDROL) injection  40 mg Intravenous Q8H  . mometasone-formoterol  2 puff Inhalation BID  . nicotine  14 mg Transdermal Daily  . potassium chloride SA  40 mEq Oral Daily  . sacubitril-valsartan  1 tablet Oral BID   Continuous Infusions: .  sodium chloride 10 mL/hr at 05/04/16 2149   PRN Meds:.acetaminophen **OR** acetaminophen, ondansetron **OR** ondansetron (ZOFRAN) IV   PHYSICAL EXAM: Vital signs: Vitals:   05/06/16 0500 05/06/16 0552 05/06/16 0554 05/06/16 0814  BP:  134/83 134/83   Pulse:  86 84   Resp:  18 18   Temp:  98.5 F (36.9 C) 98.5 F (36.9 C)   TempSrc:  Oral Oral   SpO2:  96% 96% 96%  Weight: 85.6 kg (188 lb 11.2 oz)     Height:       Filed Weights   05/04/16 2130 05/05/16 0540 05/06/16 0500  Weight: 82.6 kg (182 lb) 85.2 kg (187 lb 14.4 oz) 85.6 kg (188 lb 11.2 oz)    Body mass index is 23.59 kg/m.   General appearance :Awake, alert, not in any distress. Speech Clear. Not toxic Looking Eyes:, pupils equally reactive to light and accomodation,no scleral icterus.Pink conjunctiva HEENT: Atraumatic and Normocephalic Neck: supple, no JVD. No cervical lymphadenopathy. No thyromegaly Resp:Good air entry bilaterally, no rhonchi today CVS: S1 S2 regular GI: Bowel sounds present, Non tender and not distended with no gaurding, rigidity or rebound.No organomegaly Extremities: B/L Lower Ext shows no edema, both legs are warm to touch Neurology:  speech clear,Non focal, sensation is grossly intact. Musculoskeletal:No digital cyanosis Skin:No Rash, warm and dry Wounds:N/A  I have personally reviewed following labs and imaging studies  LABORATORY DATA: CBC:  Recent Labs Lab 05/04/16 1324 05/05/16 0425  WBC 7.6 5.7  HGB 16.1 15.1  HCT 49.6 46.7  MCV 93.8 94.0  PLT 183 163    Basic Metabolic Panel:  Recent Labs Lab 05/04/16 1324 05/05/16 0425  NA 145 140  K 4.0 4.0  CL 106 102  CO2 31 26  GLUCOSE 99 222*  BUN 15 14  CREATININE 1.21 1.13  CALCIUM 9.2 8.9    GFR: Estimated Creatinine Clearance: 81 mL/min (by C-G formula based on SCr of 1.13 mg/dL).  Liver Function Tests:  Recent Labs Lab 05/04/16 1810 05/05/16 0425  AST 59* 48*  ALT 46 43  ALKPHOS 69 68  BILITOT 0.9 0.5  PROT 6.0* 6.5  ALBUMIN 3.5 3.5    Recent Labs Lab 05/04/16 1810  LIPASE 28   No results for input(s): AMMONIA in the last 168 hours.  Coagulation Profile: No results for input(s): INR, PROTIME in the last 168 hours.  Cardiac Enzymes: No results for input(s): CKTOTAL, CKMB, CKMBINDEX, TROPONINI in the last 168 hours.  BNP (last 3 results) No results for input(s): PROBNP in the last 8760 hours.  HbA1C: No results for input(s): HGBA1C in the last 72 hours.  CBG: No results for input(s): GLUCAP in the last 168 hours.  Lipid Profile: No results  for input(s): CHOL, HDL, LDLCALC, TRIG, CHOLHDL, LDLDIRECT in the last 72 hours.  Thyroid Function Tests: No results for input(s): TSH, T4TOTAL, FREET4, T3FREE, THYROIDAB in the last 72 hours.  Anemia Panel: No results for input(s): VITAMINB12, FOLATE, FERRITIN, TIBC, IRON, RETICCTPCT in the last 72 hours.  Urine analysis: No results found for: COLORURINE, APPEARANCEUR, LABSPEC, PHURINE, GLUCOSEU, HGBUR, BILIRUBINUR, KETONESUR, PROTEINUR, UROBILINOGEN, NITRITE, LEUKOCYTESUR  Sepsis Labs: Lactic Acid, Venous    Component Value Date/Time   LATICACIDVEN 1.79 10/03/2013 0621    MICROBIOLOGY: Recent Results (from the past 240 hour(s))  Culture, blood (Routine X 2) w Reflex to ID Panel     Status: None (Preliminary result)   Collection Time: 05/04/16  9:28 PM  Result Value Ref Range Status  Specimen Description BLOOD RIGHT FOREARM  Final   Special Requests BOTTLES DRAWN AEROBIC AND ANAEROBIC  Final   Culture NO GROWTH 2 DAYS  Final   Report Status PENDING  Incomplete  Culture, blood (Routine X 2) w Reflex to ID Panel     Status: None (Preliminary result)   Collection Time: 05/04/16  9:37 PM  Result Value Ref Range Status   Specimen Description BLOOD LEFT HAND  Final   Special Requests BOTTLES DRAWN AEROBIC AND ANAEROBIC  Final   Culture NO GROWTH 2 DAYS  Final   Report Status PENDING  Incomplete    RADIOLOGY STUDIES/RESULTS: Dg Chest 2 View  Result Date: 05/04/2016 CLINICAL DATA:  Chest pain. EXAM: CHEST  2 VIEW COMPARISON:  CT 02/20/2016.  Chest x-ray 02/20/2016, 02/10/2016. FINDINGS: Mediastinum and hilar structures are normal. Right lower lobe infiltrate noted consistent pneumonia. Small right pleural effusion. No pneumothorax. No acute bony abnormality. IMPRESSION: Right lower lobe infiltrate noted consistent pneumonia. Electronically Signed   By: Maisie Fus  Register   On: 05/04/2016 14:09   US Abdomen Complete  Result Date: 05/04/2016 CLINICAL DATA:  Acute onset of  right upper quadrant abdominal pain. Initial encounter. EXAM: ABDOMEN ULTRASOUND COMPLETE COMPARISON:  Right upper tooth quadrant ultrasound performed 01/09/2016 FINDINGS: Gallbladder: No gallstones seen. Mild gallbladder wall thickening and trace pericholecystic fluid are noted, but no ultrasonographic Murphy's sign is elicited. Common bile duct: Diameter: 0.4 cm, within normal limits in caliber. Liver: No focal lesion identified. Within normal limits in parenchymal echogenicity. IVC: No abnormality visualized. Pancreas: Visualized portion unremarkable. Spleen: Size and appearance within normal limits. Right Kidney: Length: 11.3 cm. Echogenicity within normal limits. No mass or hydronephrosis visualized. Left Kidney: Length: 12.5 cm. Echogenicity within normal limits. No mass or hydronephrosis visualized. Abdominal aorta: No aneurysm visualized. Scattered calcification is noted along the abdominal aorta. Other findings: Trace fluid is seen tracking about the liver and spleen. IMPRESSION: 1. Trace ascites noted tracking about the liver and spleen. 2. Mild gallbladder wall thickening and trace pericholecystic fluid are nonspecific in the presence of ascites. No ultrasonographic Murphy's sign elicited. Gallbladder otherwise unremarkable. 3. Scattered aortic atherosclerosis. Electronically Signed   By: Roanna Raider M.D.   On: 05/04/2016 19:33     LOS: 1 day   Jeoffrey Massed, MD  [facility] Pager:[phone]  If 7PM-7AM, please contact night-coverage [email or web address] Password [email or web address] 05/06/2016, 1:09 PM

## 2016-05-06 NOTE — Consult Note (Signed)
Name: Jared [name] MRN: [id number] DOB: September 02, 1955    ADMISSION DATE:  05/04/2016 CONSULTATION DATE:  05/06/16  REFERRING MD :  [name]  CHIEF COMPLAINT:  hemoptysis  BRIEF PATIENT DESCRIPTION: 46 yoM smoker with streak hemoptysis since September, cough, RLL infiltrate  SIGNIFICANT EVENTS    STUDIES:  CT chest 9/17   HISTORY OF PRESENT ILLNESS:  17 yoM smoker with streak hemoptysis since September, cough, RLL infiltrate. Had seen Dr [name] pulmonary in Sept, 2017 after presenting to ER with hemoptysis and RLL inflt. Treated then as COPD exacerb with CAP, then didn't return after insurance change. Gradual weight loss up to 75 lbs over the last year.  Presented again to ER 11/24 with impression then that recent cough newly associated with heme, but patient tells me it never really stopped. Tussive soreness at xiphoid and upper epigastrium, not bad. No generalized bleed or bruise.  Medical hx CAD/ CM, EF 35-40%, CHF. He denies recognizing heart burn, reflux or aspiration, fever or adenopathy.No hx TB exposure.  PAST MEDICAL HISTORY :   has a past medical history of CAD (coronary artery disease); CAP (community acquired pneumonia) (09/2013); Chronic combined systolic and diastolic CHF (congestive heart failure) (HCC); Cluster headache; COPD (chronic obstructive pulmonary disease) (HCC); History of CVA (cerebrovascular accident); Hypertension; NICM (nonischemic cardiomyopathy) (HCC); and Tobacco abuse.  has a past surgical history that includes Cardiac catheterization (10/2010) and Colonoscopy w/ biopsies and polypectomy. Prior to Admission medications   Medication Sig Start Date End Date Taking? Authorizing Provider  albuterol (PROVENTIL HFA;VENTOLIN HFA) 108 (90 Base) MCG/ACT inhaler Inhale 2 puffs into the lungs every 6 (six) hours as needed for wheezing or shortness of breath. 02/03/16  Yes Jaclyn Shaggy, MD  aspirin 81 MG EC tablet Take 1 tablet (81 mg total) by mouth daily. 01/16/16   Yes Tiffany Netta Cedars, PA-C  budesonide-formoterol (SYMBICORT) 160-4.5 MCG/ACT inhaler Inhale 2 puffs into the lungs 2 (two) times daily. 02/03/16  Yes Jaclyn Shaggy, MD  carvedilol (COREG) 6.25 MG tablet Take 6.25 mg by mouth 2 (two) times daily with a meal.   Yes Historical Provider, MD  furosemide (LASIX) 40 MG tablet Take 1 tablet (40 mg total) by mouth daily. 02/24/16  Yes Scott T Alben Spittle, PA-C  ibuprofen (ADVIL,MOTRIN) 600 MG tablet Take 600 mg by mouth every 6 (six) hours as needed for headache.   Yes Historical Provider, MD  potassium chloride SA (K-DUR,KLOR-CON) 20 MEQ tablet Take 2 tablets (40 mEq total) by mouth daily. 03/02/16  Yes Scott T Weaver, PA-C  sacubitril-valsartan (ENTRESTO) 49-51 MG Take 1 tablet by mouth 2 (two) times daily. 02/24/16  Yes Beatrice Lecher, PA-C   Allergies  Allergen Reactions  . Lisinopril Cough    FAMILY HISTORY:  family history includes Cancer in his father and mother; Diabetes in his brother, father, mother, and sister; Heart disease in his mother. SOCIAL HISTORY:  reports that he has been smoking Cigarettes.  He has a 63.00 pack-year smoking history. He has never used smokeless tobacco. He reports that he drinks about 3.6 oz of alcohol per week . He reports that he does not use drugs.  REVIEW OF SYSTEMS:  + = pos Constitutional: Negative for fever, chills, weight loss, malaise/fatigue and diaphoresis.  HENT: Negative for hearing loss, ear pain, nosebleeds, congestion, sore throat, neck pain, tinnitus and ear discharge.   Eyes: Negative for blurred vision, double vision, photophobia, pain, discharge and redness.  Respiratory: +cough,+ hemoptysis, +sputum production, +shortness of breath, wheezing and  stridor.   Cardiovascular: Negative for chest pain, palpitations, orthopnea, claudication, leg swelling and PND.  Gastrointestinal: Negative for heartburn, nausea, vomiting, abdominal pain, diarrhea, constipation, blood in stool and melena.  Genitourinary:  Negative for dysuria, urgency, frequency, hematuria and flank pain.  Musculoskeletal: Negative for myalgias, back pain, joint pain and falls.  Skin: Negative for itching and rash.  Neurological: Negative for dizziness, tingling, tremors, sensory change, speech change, focal weakness, seizures, loss of consciousness, weakness and headaches.  Endo/Heme/Allergies: Negative for environmental allergies and polydipsia. Does not bruise/bleed easily.  SUBJECTIVE:   VITAL SIGNS: Temp:  [98.5 F (36.9 C)-99.3 F (37.4 C)] 98.5 F (36.9 C) (11/26 0554) Pulse Rate:  [84-88] 84 (11/26 0554) Resp:  [18-24] 18 (11/26 0554) BP: (132-134)/(83-86) 134/83 (11/26 0554) SpO2:  [92 %-96 %] 96 % (11/26 0814) Weight:  [85.6 kg (188 lb 11.2 oz)] 85.6 kg (188 lb 11.2 oz) (11/26 0500)  PHYSICAL EXAMINATION: General:  Alert M, NAD, lying in bed Neuro:  Oriented, coop, non-focal HEENT:  No stridor or JVD Cardiovascular:  RRR, no m/g/r Lungs:  Crackles R lower lung, unlabored , nasal prongs Abdomen:  Scaphoid, no HSM Musculoskeletal:  Normal symmetrical bulk Skin:  No rash or bruising   Recent Labs Lab 05/04/16 1324 05/05/16 0425  NA 145 140  K 4.0 4.0  CL 106 102  CO2 31 26  BUN 15 14  CREATININE 1.21 1.13  GLUCOSE 99 222*    Recent Labs Lab 05/04/16 1324 05/05/16 0425  HGB 16.1 15.1  HCT 49.6 46.7  WBC 7.6 5.7  PLT 183 163   Dg Chest 2 View  Result Date: 05/04/2016 CLINICAL DATA:  Chest pain. EXAM: CHEST  2 VIEW COMPARISON:  CT 02/20/2016.  Chest x-ray 02/20/2016, 02/10/2016. FINDINGS: Mediastinum and hilar structures are normal. Right lower lobe infiltrate noted consistent pneumonia. Small right pleural effusion. No pneumothorax. No acute bony abnormality. IMPRESSION: Right lower lobe infiltrate noted consistent pneumonia. Electronically Signed   By: Maisie Fus  Register   On: 05/04/2016 14:09   US Abdomen Complete  Result Date: 05/04/2016 CLINICAL DATA:  Acute onset of right upper  quadrant abdominal pain. Initial encounter. EXAM: ABDOMEN ULTRASOUND COMPLETE COMPARISON:  Right upper tooth quadrant ultrasound performed 01/09/2016 FINDINGS: Gallbladder: No gallstones seen. Mild gallbladder wall thickening and trace pericholecystic fluid are noted, but no ultrasonographic Murphy's sign is elicited. Common bile duct: Diameter: 0.4 cm, within normal limits in caliber. Liver: No focal lesion identified. Within normal limits in parenchymal echogenicity. IVC: No abnormality visualized. Pancreas: Visualized portion unremarkable. Spleen: Size and appearance within normal limits. Right Kidney: Length: 11.3 cm. Echogenicity within normal limits. No mass or hydronephrosis visualized. Left Kidney: Length: 12.5 cm. Echogenicity within normal limits. No mass or hydronephrosis visualized. Abdominal aorta: No aneurysm visualized. Scattered calcification is noted along the abdominal aorta. Other findings: Trace fluid is seen tracking about the liver and spleen. IMPRESSION: 1. Trace ascites noted tracking about the liver and spleen. 2. Mild gallbladder wall thickening and trace pericholecystic fluid are nonspecific in the presence of ascites. No ultrasonographic Murphy's sign elicited. Gallbladder otherwise unremarkable. 3. Scattered aortic atherosclerosis. Electronically Signed   By: Roanna Raider M.D.   On: 05/04/2016 19:33    ASSESSMENT / PLAN:  Hemoptysis- Concerning for possible cancer in this smoker. He says bleeding has persisted since September, intermittently. He should have bronchoscopy and asks this be done while here due to lack of insurance.  RLL infiltrate. Unclear if this ever resolved after September presentation. Possible gastric  aspiration, but he denies. Some may now be blood. I don't see discrete mass, but concerning for Ca.  Recommend: Continue antibiotics. I will discuss with my group- potentially could do bronchoscopy Tuesday, 11/28.  CD Maple HudsonYoung, MD Pulmonary and Critical Care  [facility] Pager: [phone]  After 3:00 PM [phone]  05/06/2016, 1:09 PM

## 2016-05-07 DIAGNOSIS — J181 Lobar pneumonia, unspecified organism: Secondary | ICD-10-CM | POA: Diagnosis not present

## 2016-05-07 DIAGNOSIS — R042 Hemoptysis: Secondary | ICD-10-CM | POA: Diagnosis not present

## 2016-05-07 NOTE — Care Management Note (Addendum)
Case Management Note  Patient Details  Name: Radford Rivera MRN: [id number] Date of Birth: 1956-04-01  Subjective/Objective:                 Patient from home, lives alone, admitted with PNA. Will have bronch tomorrow.  PCP Dr. Venetia Night   Action/Plan:  DC to home, self care, follow up apt made at [facility] Dec 4 at 4:00 with Dr Venetia Night. Patient can fill meds at [facility] at discharge.  Expected Discharge Date:                  Expected Discharge Plan:  Home/Self Care  In-House Referral:     Discharge planning Services  CM Consult, Follow-up appt scheduled  Post Acute Care Choice:    Choice offered to:     DME Arranged:    DME Agency:     HH Arranged:    HH Agency:     Status of Service:  In process, will continue to follow  If discussed at Long Length of Stay Meetings, dates discussed:    Additional Comments:  Lawerance Sabal, RN 05/07/2016, 11:27 AM

## 2016-05-07 NOTE — Progress Notes (Signed)
   Name: Jared [name] MRN: [id number] DOB: 13-May-1956    ADMISSION DATE:  05/04/2016 CONSULTATION DATE:  05/06/16  REFERRING MD :  [name]  CHIEF COMPLAINT:  hemoptysis  BRIEF PATIENT DESCRIPTION: 23 yoM smoker with streak hemoptysis since September, cough, RLL infiltrate  SIGNIFICANT EVENTS    STUDIES:  CT chest 9/17   HISTORY OF PRESENT ILLNESS:  50 yoM smoker with streak hemoptysis since September, cough, RLL infiltrate. Had seen Dr [name] pulmonary in Sept, 2017 after presenting to ER with hemoptysis and RLL inflt. Treated then as COPD exacerb with CAP, then didn't return after insurance change. Gradual weight loss up to 75 lbs over the last year.  Presented again to ER 11/24 with impression then that recent cough newly associated with heme, but patient tells me it never really stopped. Tussive soreness at xiphoid and upper epigastrium, not bad. No generalized bleed or bruise.  Medical hx CAD/ CM, EF 35-40%, CHF. He denies recognizing heart burn, reflux or aspiration, fever or adenopathy.No hx TB exposure.    SUBJECTIVE: Continues to have blood-tinged sputum No dyspnea or chest pain  VITAL SIGNS: Temp:  [97.9 F (36.6 C)-98.7 F (37.1 C)] 98.7 F (37.1 C) (11/27 1600) Pulse Rate:  [80-88] 88 (11/27 1600) Resp:  [20-21] 20 (11/27 1600) BP: (131-139)/(86-94) 139/86 (11/27 1600) SpO2:  [93 %-100 %] 100 % (11/27 1600) Weight:  [188 lb 8 oz (85.5 kg)] 188 lb 8 oz (85.5 kg) (11/27 0557)  PHYSICAL EXAMINATION: General:  Alert M, NAD, lying in bed Neuro:  Oriented, coop, non-focal HEENT:  No stridor or JVD Cardiovascular:  RRR, no m/g/r Lungs:  Crackles R lower lung, unlabored , nasal prongs Abdomen:  Scaphoid, no HSM Musculoskeletal:  Normal symmetrical bulk Skin:  No rash or bruising   Recent Labs Lab 05/04/16 1324 05/05/16 0425  NA 145 140  K 4.0 4.0  CL 106 102  CO2 31 26  BUN 15 14  CREATININE 1.21 1.13  GLUCOSE 99 222*    Recent Labs Lab  05/04/16 1324 05/05/16 0425  HGB 16.1 15.1  HCT 49.6 46.7  WBC 7.6 5.7  PLT 183 163   No results found.  ASSESSMENT / PLAN:  Hemoptysis- Concerning for possible cancer in this smoker. He says bleeding has persisted since September, intermittently. Bronchoscopy was scheduled for 11/20 8 AM  RLL infiltrate. Unclear if this ever resolved after September presentation. Possible gastric aspiration, but he denies. Some may now be blood. I don't see discrete mass, but concerning for Ca. We will obtain transbronchial biopsies of right lower lobe  [name] MD. FCCP. [place] [facility] Pager [phone] If no response call [phone]    05/07/2016, 5:07 PM

## 2016-05-07 NOTE — Progress Notes (Addendum)
PROGRESS NOTE    Jared [name]  WUJ:[id number] DOB: 12-24-1955 DOA: 05/04/2016 PCP: Jaclyn Shaggy, MD     Brief Narrative:  Patient is a 60 y.o. male smoker presenting with hemoptysis, found to have right lower lobe infiltrate. Interestingly, had similar symptoms in September and a infiltrate in the right lower lobe then as well. Admitted for further evaluation and treatment   Assessment & Plan:   Principal Problem:   Hemoptysis Active Problems:   Community acquired pneumonia of right lower lobe of lung (HCC)   Hypertension   Nonischemic dilated cardiomyopathy (HCC)   Cigarette smoker   Chronic systolic CHF (congestive heart failure) (HCC)   Hemoptysis with right lower lobe infiltrate-due to recurrent PNA vs malignancy: Long-time smoker, chest x-ray shows right lower lobe infiltrate. He had similar echo features approximately 2 months back when he presented to the emergency room with hemoptysis, CT chest then also documented right lower lobe infiltrate. He appears stable, continues to have some amount of hemoptysis, continue empiric antibiotics. PCCM planning for potential bronchoscopy 11/28   COPD exacerbation: Much improved-transition to prednisone, continue bronchodilators.  Chronic systolic heart failure (EF 35-40% by TTE on Aug 2017): Although weight is up -he does not appear to be overtly volume overloaded- continue diuretics, Coreg and Entresto  Tobacco abuse: Counseled, continue transdermal nicotine.   DVT prophylaxis: lovenox Code Status: full Family Communication: No family at bedside Disposition Plan: Suspect he could discharge back home in stable   Consultants:   PCCM  Procedures:   None  Antimicrobials:   Rocephin, azithromax 11/24 >>     Subjective: He states that his breathing has improved since being in the hospital. He continues to complain of hemoptysis about 5-6 times a day. He describes as being a less than 1 shot glass worth. She also admits  to intermittent left-sided chest pain, but no chest pain currently. Denies any nausea, vomiting or diarrhea.  Objective: Vitals:   05/06/16 1331 05/06/16 1532 05/06/16 2132 05/07/16 0557  BP:  133/89 132/87 (!) 131/94  Pulse:  84 81 80  Resp:  14  (!) 21  Temp:  97.8 F (36.6 C) 97.9 F (36.6 C) 98.5 F (36.9 C)  TempSrc:  Oral Oral Oral  SpO2: 90% 96% 95% 96%  Weight:    85.5 kg (188 lb 8 oz)  Height:        Intake/Output Summary (Last 24 hours) at 05/07/16 0823 Last data filed at 05/07/16 0557  Gross per 24 hour  Intake                0 ml  Output              960 ml  Net             -960 ml   Filed Weights   05/05/16 0540 05/06/16 0500 05/07/16 0557  Weight: 85.2 kg (187 lb 14.4 oz) 85.6 kg (188 lb 11.2 oz) 85.5 kg (188 lb 8 oz)    Examination:  General exam: Appears calm and comfortable  Respiratory system: Right basilar crackles, Respiratory effort normal. Cardiovascular system: S1 & S2 heard, RRR. No JVD, murmurs, rubs, gallops or clicks. No pedal edema. Gastrointestinal system: Abdomen is nondistended, soft and nontender. No organomegaly or masses felt. Normal bowel sounds heard. Central nervous system: Alert and oriented. No focal neurological deficits. Extremities: Symmetric 5 x 5 power. Skin: No rashes, lesions or ulcers Psychiatry: Judgement and insight appear normal. Mood & affect appropriate.  Data Reviewed: I have personally reviewed following labs and imaging studies  CBC:  Recent Labs Lab 05/04/16 1324 05/05/16 0425  WBC 7.6 5.7  HGB 16.1 15.1  HCT 49.6 46.7  MCV 93.8 94.0  PLT 183 163   Basic Metabolic Panel:  Recent Labs Lab 05/04/16 1324 05/05/16 0425  NA 145 140  K 4.0 4.0  CL 106 102  CO2 31 26  GLUCOSE 99 222*  BUN 15 14  CREATININE 1.21 1.13  CALCIUM 9.2 8.9   GFR: Estimated Creatinine Clearance: 81 mL/min (by C-G formula based on SCr of 1.13 mg/dL). Liver Function Tests:  Recent Labs Lab 05/04/16 1810 05/05/16 0425    AST 59* 48*  ALT 46 43  ALKPHOS 69 68  BILITOT 0.9 0.5  PROT 6.0* 6.5  ALBUMIN 3.5 3.5    Recent Labs Lab 05/04/16 1810  LIPASE 28   No results for input(s): AMMONIA in the last 168 hours. Coagulation Profile: No results for input(s): INR, PROTIME in the last 168 hours. Cardiac Enzymes: No results for input(s): CKTOTAL, CKMB, CKMBINDEX, TROPONINI in the last 168 hours. BNP (last 3 results) No results for input(s): PROBNP in the last 8760 hours. HbA1C: No results for input(s): HGBA1C in the last 72 hours. CBG: No results for input(s): GLUCAP in the last 168 hours. Lipid Profile: No results for input(s): CHOL, HDL, LDLCALC, TRIG, CHOLHDL, LDLDIRECT in the last 72 hours. Thyroid Function Tests: No results for input(s): TSH, T4TOTAL, FREET4, T3FREE, THYROIDAB in the last 72 hours. Anemia Panel: No results for input(s): VITAMINB12, FOLATE, FERRITIN, TIBC, IRON, RETICCTPCT in the last 72 hours. Sepsis Labs: No results for input(s): PROCALCITON, LATICACIDVEN in the last 168 hours.  Recent Results (from the past 240 hour(s))  Culture, blood (Routine X 2) w Reflex to ID Panel     Status: None (Preliminary result)   Collection Time: 05/04/16  9:28 PM  Result Value Ref Range Status   Specimen Description BLOOD RIGHT FOREARM  Final   Special Requests BOTTLES DRAWN AEROBIC AND ANAEROBIC  Final   Culture NO GROWTH 2 DAYS  Final   Report Status PENDING  Incomplete  Culture, blood (Routine X 2) w Reflex to ID Panel     Status: None (Preliminary result)   Collection Time: 05/04/16  9:37 PM  Result Value Ref Range Status   Specimen Description BLOOD LEFT HAND  Final   Special Requests BOTTLES DRAWN AEROBIC AND ANAEROBIC  Final   Culture NO GROWTH 2 DAYS  Final   Report Status PENDING  Incomplete       Radiology Studies: No results found.    Scheduled Meds: . aspirin EC  81 mg Oral Daily  . azithromycin  500 mg Oral Daily  . carvedilol  6.25 mg Oral BID WC  .  cefTRIAXone (ROCEPHIN)  IV  1 g Intravenous Q24H  . enoxaparin (LOVENOX) injection  40 mg Subcutaneous Q24H  . furosemide  40 mg Oral Daily  . guaiFENesin  600 mg Oral BID  . ipratropium-albuterol  3 mL Nebulization TID  . mometasone-formoterol  2 puff Inhalation BID  . nicotine  14 mg Transdermal Daily  . potassium chloride SA  40 mEq Oral Daily  . predniSONE  40 mg Oral Q breakfast  . sacubitril-valsartan  1 tablet Oral BID   Continuous Infusions: . sodium chloride 10 mL/hr at 05/04/16 2149     LOS: 2 days    Time spent: 40 minutes   Noralee Stain, DO  [facility] [email or web address] Password [email or web address] 05/07/2016, 8:23 AM

## 2016-05-08 ENCOUNTER — Inpatient Hospital Stay (HOSPITAL_COMMUNITY): Payer: Self-pay

## 2016-05-08 ENCOUNTER — Encounter (HOSPITAL_COMMUNITY)
Admission: EM | Disposition: A | Discharge status: Home / Self Care | Payer: Self-pay | Source: Home / Self Care | Attending: Internal Medicine

## 2016-05-08 ENCOUNTER — Encounter (HOSPITAL_COMMUNITY): Payer: Commercial Managed Care - HMO

## 2016-05-08 HISTORY — PX: VIDEO BRONCHOSCOPY: SHX5072

## 2016-05-08 LAB — CBC WITH DIFFERENTIAL/PLATELET
Basophils Absolute: 0 10*3/uL (ref 0.0–0.1)
Basophils Relative: 0 %
Eosinophils Absolute: 0 10*3/uL (ref 0.0–0.7)
Eosinophils Relative: 0 %
HCT: 48.5 % (ref 39.0–52.0)
Hemoglobin: 16 g/dL (ref 13.0–17.0)
Lymphocytes Relative: 28 %
Lymphs Abs: 3.2 10*3/uL (ref 0.7–4.0)
MCH: 30.6 pg (ref 26.0–34.0)
MCHC: 33 g/dL (ref 30.0–36.0)
MCV: 92.7 fL (ref 78.0–100.0)
Monocytes Absolute: 0.6 10*3/uL (ref 0.1–1.0)
Monocytes Relative: 5 %
Neutro Abs: 7.9 10*3/uL — ABNORMAL HIGH (ref 1.7–7.7)
Neutrophils Relative %: 67 %
Platelets: 191 10*3/uL (ref 150–400)
RBC: 5.23 MIL/uL (ref 4.22–5.81)
RDW: 13.7 % (ref 11.5–15.5)
WBC: 11.7 10*3/uL — ABNORMAL HIGH (ref 4.0–10.5)

## 2016-05-08 LAB — BASIC METABOLIC PANEL WITH GFR
Anion gap: 7 (ref 5–15)
BUN: 15 mg/dL (ref 6–20)
CO2: 32 mmol/L (ref 22–32)
Calcium: 8.5 mg/dL — ABNORMAL LOW (ref 8.9–10.3)
Chloride: 103 mmol/L (ref 101–111)
Creatinine, Ser: 1.09 mg/dL (ref 0.61–1.24)
GFR calc Af Amer: >60 mL/min
GFR calc non Af Amer: >60 mL/min
Glucose, Bld: 100 mg/dL — ABNORMAL HIGH (ref 65–99)
Potassium: 3.8 mmol/L (ref 3.5–5.1)
Sodium: 142 mmol/L (ref 135–145)

## 2016-05-08 SURGERY — BRONCHOSCOPY, WITH FLUOROSCOPY
Anesthesia: Moderate Sedation | Laterality: Bilateral

## 2016-05-08 MED ORDER — FENTANYL CITRATE (PF) 100 MCG/2ML IJ SOLN
25.0000 ug | INTRAMUSCULAR | Status: DC | PRN
  Administered 2016-05-08 (×4): 25 ug via INTRAVENOUS

## 2016-05-08 MED ORDER — LIDOCAINE HCL 2 % EX GEL
CUTANEOUS | Status: DC | PRN
  Administered 2016-05-08: 1

## 2016-05-08 MED ORDER — SODIUM CHLORIDE 0.9 % IV SOLN
Freq: Once | INTRAVENOUS | Status: AC
  Administered 2016-05-08: 11:00:00 via INTRAVENOUS

## 2016-05-08 MED ORDER — FENTANYL CITRATE (PF) 100 MCG/2ML IJ SOLN
INTRAMUSCULAR | Status: AC
  Filled 2016-05-08: qty 4

## 2016-05-08 MED ORDER — MIDAZOLAM HCL 5 MG/ML IJ SOLN
INTRAMUSCULAR | Status: AC
  Filled 2016-05-08: qty 2

## 2016-05-08 MED ORDER — PHENYLEPHRINE HCL 0.25 % NA SOLN
NASAL | Status: DC | PRN
  Administered 2016-05-08: 2 via NASAL

## 2016-05-08 MED ORDER — MIDAZOLAM HCL 10 MG/2ML IJ SOLN
INTRAMUSCULAR | Status: DC | PRN
  Administered 2016-05-08 (×4): 1 mg via INTRAVENOUS

## 2016-05-08 NOTE — Progress Notes (Signed)
   Name: Jared [name] MRN: [id number] DOB: [birth year]    ADMISSION DATE:  05/04/2016 CONSULTATION DATE:  05/06/16  REFERRING MD :  [name]  CHIEF COMPLAINT:  hemoptysis  BRIEF PATIENT DESCRIPTION: 47 yoM smoker with streak hemoptysis since September, cough, RLL infiltrate  SIGNIFICANT EVENTS    STUDIES:  CT chest 9/17   HISTORY OF PRESENT ILLNESS:  10 yoM smoker with streak hemoptysis since September, cough, RLL infiltrate. Had seen Dr [name] pulmonary in Sept, 2017 after presenting to ER with hemoptysis and RLL inflt. Treated then as COPD exacerb with CAP, then didn't return after insurance change. Gradual weight loss up to 75 lbs over the last year.  Presented again to ER 11/24 with impression then that recent cough newly associated with heme, but patient tells me it never really stopped. Tussive soreness at xiphoid and upper epigastrium, not bad. No generalized bleed or bruise.  Medical hx CAD/ CM, EF 35-40%, CHF. He denies heart burn, reflux or aspiration, fever or adenopathy.No hx TB exposure.    SUBJECTIVE: Continues to have blood-tinged sputum No dyspnea or chest pain  VITAL SIGNS: Temp:  [98.2 F (36.8 C)-98.7 F (37.1 C)] 98.7 F (37.1 C) (11/28 1113) Pulse Rate:  [66-88] 78 (11/28 1130) Resp:  [7-20] 15 (11/28 1130) BP: (122-177)/(85-121) 172/121 (11/28 1125) SpO2:  [87 %-100 %] 95 % (11/28 1130) Weight:  [190 lb (86.2 kg)-190 lb 9.6 oz (86.5 kg)] 190 lb (86.2 kg) (11/28 1113)  PHYSICAL EXAMINATION: General:  Alert M, NAD, lying in bed Neuro:  Oriented, coop, non-focal HEENT:  No stridor or JVD Cardiovascular:  RRR, no m/g/r Lungs:  Crackles R lower lung, unlabored , nasal prongs Abdomen:  Scaphoid, no HSM Musculoskeletal:  Normal symmetrical bulk Skin:  No rash or bruising   Recent Labs Lab 05/04/16 1324 05/05/16 0425 05/08/16 0526  NA 145 140 142  K 4.0 4.0 3.8  CL 106 102 103  CO2 31 26 32  BUN 15 14 15   CREATININE 1.21 1.13 1.09  GLUCOSE  99 222* 100*    Recent Labs Lab 05/04/16 1324 05/05/16 0425 05/08/16 0526  HGB 16.1 15.1 16.0  HCT 49.6 46.7 48.5  WBC 7.6 5.7 11.7*  PLT 183 163 191   No results found.  ASSESSMENT / PLAN:  Hemoptysis- Concerning for possible cancer in this smoker. He says bleeding has persisted since September, intermittently. Bronchoscopy today  RLL infiltrate. Unclear if this ever resolved after September presentation. Possible gastric aspiration, but he denies.   He can discharge after procedure & recovery Needs outpt pulmonary FU with Dr wert  [name] MD. [facility]. [place] [facility] Pager [phone] If no response call [phone]    05/08/2016, 12:02 PM

## 2016-05-08 NOTE — Progress Notes (Signed)
PROGRESS NOTE    Jared [name]  ZOX:[id number] DOB: 12/19/1955 DOA: 05/04/2016 PCP: Jaclyn ShaggyEnobong, Amao, MD     Brief Narrative:  Patient is a 60 y.o. male smoker presenting with hemoptysis, found to have right lower lobe infiltrate. Interestingly, had similar symptoms in September and a infiltrate in the right lower lobe then as well. Admitted for further evaluation and treatment. Pulmonary was consulted and patient scheduled for bronch 11/28.    Assessment & Plan:   Principal Problem:   Hemoptysis Active Problems:   Community acquired pneumonia of right lower lobe of lung (HCC)   Hypertension   COPD with acute exacerbation (HCC)   Nonischemic dilated cardiomyopathy (HCC)   Cigarette smoker   Chronic systolic CHF (congestive heart failure) (HCC)   Hemoptysis with right lower lobe infiltrate-due to recurrent PNA vs malignancy: Long-time smoker, chest x-ray shows right lower lobe infiltrate. He had similar echo features approximately 2 months back when he presented to the emergency room with hemoptysis, CT chest then also documented right lower lobe infiltrate. He appears stable, continues to have some amount of hemoptysis, continue empiric antibiotics Rocephin/Azithromax. PCCM planning for bronchoscopy 11/28   COPD exacerbation: Much improved-transition to prednisone, continue bronchodilators.  Chronic systolic heart failure (EF 35-40% by TTE on Aug 2017): Although weight is up -he does not appear to be overtly volume overloaded- continue diuretics, Coreg and Entresto  Tobacco abuse: Counseled, continue transdermal nicotine.   DVT prophylaxis: lovenox Code Status: full Family Communication: No family at bedside Disposition Plan: Suspect he could discharge back home in stable   Consultants:   PCCM  Procedures:   None  Antimicrobials:   Rocephin, azithromax 11/24 >>     Subjective: He states that his breathing has improved since being in the hospital. Hemoptysis has  improved. Denies chest pain, SOB.   Objective: Vitals:   05/07/16 2200 05/08/16 0441 05/08/16 0504 05/08/16 0506  BP: 122/85  (!) 148/102 (!) 146/96  Pulse: 78  72   Resp:   18   Temp: 98.5 F (36.9 C)  98.2 F (36.8 C)   TempSrc: Oral  Oral   SpO2: 95%  98%   Weight:  86.5 kg (190 lb 9.6 oz)    Height:        Intake/Output Summary (Last 24 hours) at 05/08/16 1051 Last data filed at 05/08/16 0900  Gross per 24 hour  Intake           383.33 ml  Output             1165 ml  Net          -781.67 ml   Filed Weights   05/06/16 0500 05/07/16 0557 05/08/16 0441  Weight: 85.6 kg (188 lb 11.2 oz) 85.5 kg (188 lb 8 oz) 86.5 kg (190 lb 9.6 oz)    Examination:  General exam: Appears calm and comfortable  Respiratory system: Right basilar crackles, Respiratory effort normal. Cardiovascular system: S1 & S2 heard, RRR. No JVD, murmurs, rubs, gallops or clicks. No pedal edema. Gastrointestinal system: Abdomen is nondistended, soft and nontender. No organomegaly or masses felt. Normal bowel sounds heard. Central nervous system: Alert and oriented. No focal neurological deficits. Extremities: Symmetric 5 x 5 power. Skin: No rashes, lesions or ulcers Psychiatry: Judgement and insight appear normal. Mood & affect appropriate.   Data Reviewed: I have personally reviewed following labs and imaging studies  CBC:  Recent Labs Lab 05/04/16 1324 05/05/16 0425 05/08/16 0526  WBC 7.6 5.7 11.7*  NEUTROABS  --   --  7.9*  HGB 16.1 15.1 16.0  HCT 49.6 46.7 48.5  MCV 93.8 94.0 92.7  PLT 183 163 191   Basic Metabolic Panel:  Recent Labs Lab 05/04/16 1324 05/05/16 0425 05/08/16 0526  NA 145 140 142  K 4.0 4.0 3.8  CL 106 102 103  CO2 31 26 32  GLUCOSE 99 222* 100*  BUN 15 14 15   CREATININE 1.21 1.13 1.09  CALCIUM 9.2 8.9 8.5*   GFR: Estimated Creatinine Clearance: 84 mL/min (by C-G formula based on SCr of 1.09 mg/dL). Liver Function Tests:  Recent Labs Lab 05/04/16 1810  05/05/16 0425  AST 59* 48*  ALT 46 43  ALKPHOS 69 68  BILITOT 0.9 0.5  PROT 6.0* 6.5  ALBUMIN 3.5 3.5    Recent Labs Lab 05/04/16 1810  LIPASE 28   No results for input(s): AMMONIA in the last 168 hours. Coagulation Profile: No results for input(s): INR, PROTIME in the last 168 hours. Cardiac Enzymes: No results for input(s): CKTOTAL, CKMB, CKMBINDEX, TROPONINI in the last 168 hours. BNP (last 3 results) No results for input(s): PROBNP in the last 8760 hours. HbA1C: No results for input(s): HGBA1C in the last 72 hours. CBG: No results for input(s): GLUCAP in the last 168 hours. Lipid Profile: No results for input(s): CHOL, HDL, LDLCALC, TRIG, CHOLHDL, LDLDIRECT in the last 72 hours. Thyroid Function Tests: No results for input(s): TSH, T4TOTAL, FREET4, T3FREE, THYROIDAB in the last 72 hours. Anemia Panel: No results for input(s): VITAMINB12, FOLATE, FERRITIN, TIBC, IRON, RETICCTPCT in the last 72 hours. Sepsis Labs: No results for input(s): PROCALCITON, LATICACIDVEN in the last 168 hours.  Recent Results (from the past 240 hour(s))  Culture, blood (Routine X 2) w Reflex to ID Panel     Status: None (Preliminary result)   Collection Time: 05/04/16  9:28 PM  Result Value Ref Range Status   Specimen Description BLOOD RIGHT FOREARM  Final   Special Requests BOTTLES DRAWN AEROBIC AND ANAEROBIC  Final   Culture NO GROWTH 3 DAYS  Final   Report Status PENDING  Incomplete  Culture, blood (Routine X 2) w Reflex to ID Panel     Status: None (Preliminary result)   Collection Time: 05/04/16  9:37 PM  Result Value Ref Range Status   Specimen Description BLOOD LEFT HAND  Final   Special Requests BOTTLES DRAWN AEROBIC AND ANAEROBIC  Final   Culture NO GROWTH 3 DAYS  Final   Report Status PENDING  Incomplete       Radiology Studies: No results found.    Scheduled Meds: . aspirin EC  81 mg Oral Daily  . azithromycin  500 mg Oral Daily  . carvedilol  6.25 mg Oral  BID WC  . cefTRIAXone (ROCEPHIN)  IV  1 g Intravenous Q24H  . furosemide  40 mg Oral Daily  . guaiFENesin  600 mg Oral BID  . ipratropium-albuterol  3 mL Nebulization TID  . mometasone-formoterol  2 puff Inhalation BID  . nicotine  14 mg Transdermal Daily  . potassium chloride SA  40 mEq Oral Daily  . predniSONE  40 mg Oral Q breakfast  . sacubitril-valsartan  1 tablet Oral BID   Continuous Infusions: . sodium chloride 10 mL/hr at 05/04/16 2149     LOS: 3 days    Time spent: 20 minutes   Noralee Stain, DO [facility] [email or web address] Password [facility] 05/08/2016, 10:51 AM

## 2016-05-08 NOTE — Procedures (Signed)
Bronchoscopy Procedure Note Jared [name] [id number] 1955-09-27  Procedure: Bronchoscopy Indications: Obtain specimens for culture and/or other diagnostic studies  Procedure Details Consent: Risks of procedure as well as the alternatives and risks of each were explained to the (patient/caregiver).  Consent for procedure obtained.   The risks of the procedure including coughing, bleeding and the small chance of lung puncture requiring chest tube were discussed in great detail. The benefits & alternatives including serial follow up were also discussed.   Time Out: Verified patient identification, verified procedure, site/side was marked, verified correct patient position, special equipment/implants available, medications/allergies/relevent history reviewed, required imaging and test results available.  Performed  In preparation for procedure, bronchoscope lubricated and inhaled beta agonist administered. Sedation: 4 mg versed & 100  mcg fentnayl used in divided doses during the procedure   Bronchoscope entered from the right nare. Upper airway nml Vocal cords showed nml appearance & motion. Trachea & bronchial tree examined to the subsegmental level. Mild amount of white secretions were noted. No endobronchial lesions seen. Trans bronchial biopsies x 4 were obtained from the RLL under fluoroscopy. BAL was also obtained from the RLL.  There was moderate coughing  during the procedure. BP was elevated prior to & after the procedure.  A CXR will be performed to r/o presence of pneumothorax.   Evaluation Hemodynamic Status: BP stable throughout; O2 sats: stable throughout Patient's Current Condition: stable Specimens:   serosanguinous fluid for BAL, TBBX Complications: No apparent complications Patient did tolerate procedure well.   Jared [name] V. 05/08/2016

## 2016-05-08 NOTE — Progress Notes (Signed)
Video bronchoscopy performed.  Intervention bronchial washing. Intervention bronchial biopsy.  No complications noted.  Will continue to monitor. 

## 2016-05-09 ENCOUNTER — Encounter (HOSPITAL_COMMUNITY): Payer: Self-pay | Admitting: Pulmonary Disease

## 2016-05-09 LAB — BASIC METABOLIC PANEL
Anion gap: 8 (ref 5–15)
BUN: 12 mg/dL (ref 6–20)
CO2: 32 mmol/L (ref 22–32)
Calcium: 8.8 mg/dL — ABNORMAL LOW (ref 8.9–10.3)
Chloride: 103 mmol/L (ref 101–111)
Creatinine, Ser: 1.08 mg/dL (ref 0.61–1.24)
GFR calc Af Amer: >60 mL/min (ref >60)
GFR calc non Af Amer: >60 mL/min (ref >60)
Glucose, Bld: 110 mg/dL — ABNORMAL HIGH (ref 65–99)
Potassium: 4.6 mmol/L (ref 3.5–5.1)
Sodium: 143 mmol/L (ref 135–145)

## 2016-05-09 LAB — CBC WITH DIFFERENTIAL/PLATELET
Basophils Absolute: 0 10*3/uL (ref 0.0–0.1)
Basophils Relative: 0 %
Eosinophils Absolute: 0 10*3/uL (ref 0.0–0.7)
Eosinophils Relative: 0 %
HCT: 51.2 % (ref 39.0–52.0)
Hemoglobin: 16.8 g/dL (ref 13.0–17.0)
Lymphocytes Relative: 22 %
Lymphs Abs: 2 10*3/uL (ref 0.7–4.0)
MCH: 30.3 pg (ref 26.0–34.0)
MCHC: 32.8 g/dL (ref 30.0–36.0)
MCV: 92.3 fL (ref 78.0–100.0)
Monocytes Absolute: 0.4 10*3/uL (ref 0.1–1.0)
Monocytes Relative: 5 %
Neutro Abs: 6.9 10*3/uL (ref 1.7–7.7)
Neutrophils Relative %: 73 %
Platelets: 190 10*3/uL (ref 150–400)
RBC: 5.55 MIL/uL (ref 4.22–5.81)
RDW: 13.5 % (ref 11.5–15.5)
WBC: 9.4 10*3/uL (ref 4.0–10.5)

## 2016-05-09 LAB — CULTURE, BLOOD (ROUTINE X 2)
Culture: NO GROWTH
Culture: NO GROWTH

## 2016-05-09 LAB — ACID FAST SMEAR (AFB, MYCOBACTERIA): Acid Fast Smear: NEGATIVE

## 2016-05-09 MED ORDER — CEFPODOXIME PROXETIL 200 MG PO TABS: 200.0000 mg | ORAL_TABLET | Freq: Two times a day (BID) | ORAL | 0 refills | Status: AC

## 2016-05-09 MED ORDER — POTASSIUM CHLORIDE CRYS ER 20 MEQ PO TBCR: 40.0000 meq | EXTENDED_RELEASE_TABLET | Freq: Every day | ORAL | 0 refills | Status: DC

## 2016-05-09 MED ORDER — BUDESONIDE-FORMOTEROL FUMARATE 160-4.5 MCG/ACT IN AERO: 2.0000 | INHALATION_SPRAY | Freq: Two times a day (BID) | RESPIRATORY_TRACT | 0 refills | Status: DC

## 2016-05-09 MED ORDER — AZITHROMYCIN 500 MG PO TABS: 500.0000 mg | ORAL_TABLET | Freq: Every day | ORAL | 0 refills | Status: AC

## 2016-05-09 MED ORDER — CEFPODOXIME PROXETIL 200 MG PO TABS
200.0000 mg | ORAL_TABLET | Freq: Two times a day (BID) | ORAL | Status: DC
  Administered 2016-05-09: 200 mg via ORAL
  Filled 2016-05-09: qty 1

## 2016-05-09 MED ORDER — IPRATROPIUM-ALBUTEROL 0.5-2.5 (3) MG/3ML IN SOLN: 3.0000 mL | RESPIRATORY_TRACT | Status: DC | PRN

## 2016-05-09 MED ORDER — PREDNISONE 20 MG PO TABS: ORAL_TABLET | ORAL | 0 refills | Status: DC

## 2016-05-09 MED ORDER — NICOTINE 14 MG/24HR TD PT24: 14.0000 mg | MEDICATED_PATCH | Freq: Every day | TRANSDERMAL | 0 refills | Status: DC

## 2016-05-09 MED ORDER — IPRATROPIUM-ALBUTEROL 0.5-2.5 (3) MG/3ML IN SOLN
3.0000 mL | Freq: Two times a day (BID) | RESPIRATORY_TRACT | Status: DC
  Administered 2016-05-09: 3 mL via RESPIRATORY_TRACT
  Filled 2016-05-09 (×2): qty 3

## 2016-05-09 MED FILL — POTASSIUM CL ER 20 MEQ TAB: 20 | 30 days supply | Qty: 60 | Fill #0

## 2016-05-09 NOTE — Discharge Summary (Signed)
[facility]  Physician Discharge Summary   Patient ID: Jared [name] MRN: [id number] DOB/AGE: [birth year] 60 y.o.  Admit date: 05/04/2016 Discharge date: 05/09/2016  PCP: Jaclyn Shaggy, MD  DISCHARGE DIAGNOSES:  Principal Problem:   Hemoptysis Active Problems:   Community acquired pneumonia of right lower lobe of lung (HCC)   Hypertension   COPD with acute exacerbation (HCC)   Nonischemic dilated cardiomyopathy (HCC)   Cigarette smoker   Chronic systolic CHF (congestive heart failure) (HCC)   RECOMMENDATIONS FOR OUTPATIENT FOLLOW UP: 1. Transbronchial biopsies were taken and pathology is pending.  2. Tracheal aspirate from bronchoscopy is pending.   DISCHARGE CONDITION: fair  Diet recommendation: As before  Filed Weights   05/08/16 0441 05/08/16 1113 05/09/16 0500  Weight: 86.5 kg (190 lb 9.6 oz) 86.2 kg (190 lb) 84.7 kg (186 lb 11.4 oz)    INITIAL HISTORY: Patient is a 60 y.o.malesmoker presenting with hemoptysis, found to have right lower lobe infiltrate. Interestingly, had similar symptoms in September and a infiltrate in the right lower lobe then as well. Admitted for further evaluation and treatment. Pulmonary was consulted and patient scheduled for bronch 11/28.   Consultations:  PCCM  Procedures:  Bronchoscopy  HOSPITAL COURSE:   Hemoptysis Etiology is unclear. Patient is a long-time smoker. X-ray showed right lower lobe infiltrate. He had similar features. 2 months ago when he presented at that time with hemoptysis. Patient was seen by pulmonology. Underwent bronchoscopy, which did not show any lesions. Biopsies have been taken. Cultures are pending. In the meantime, patient has improved symptomatically. He was initially placed on Rocephin and azithromycin. Will be discharged on Vantin and azithromycin.  COPD exacerbation Much improved. He'll be discharged home on tapering doses of steroids. Continue with other home medications.  Chronic  systolic heart failure (EF 35-40% by TTE on Aug 2017) Well compensated. Continue home medications.  Tobacco abuse Counseled, continue transdermal nicotine.  Abnormal abdominal ultrasound Trace ascites noted. Some gallbladder wall thickening noted, although he was asymptomatic. Outpatient monitoring.  Stable. Ambulating without any difficulties. Saturating normally on room air. Okay to discharge home today.   PERTINENT LABS:  The results of significant diagnostics from this hospitalization (including imaging, microbiology, ancillary and laboratory) are listed below for reference.    Microbiology: Recent Results (from the past 240 hour(s))  Culture, blood (Routine X 2) w Reflex to ID Panel     Status: None   Collection Time: 05/04/16  9:28 PM  Result Value Ref Range Status   Specimen Description BLOOD RIGHT FOREARM  Final   Special Requests BOTTLES DRAWN AEROBIC AND ANAEROBIC  Final   Culture NO GROWTH 5 DAYS  Final   Report Status 05/09/2016 FINAL  Final  Culture, blood (Routine X 2) w Reflex to ID Panel     Status: None   Collection Time: 05/04/16  9:37 PM  Result Value Ref Range Status   Specimen Description BLOOD LEFT HAND  Final   Special Requests BOTTLES DRAWN AEROBIC AND ANAEROBIC  Final   Culture NO GROWTH 5 DAYS  Final   Report Status 05/09/2016 FINAL  Final  Culture, bal-quantitative     Status: None (Preliminary result)   Collection Time: 05/08/16 12:09 PM  Result Value Ref Range Status   Specimen Description BRONCHIAL ALVEOLAR LAVAGE  Final   Special Requests NONE  Final   Gram Stain   Final    MODERATE WBC PRESENT, PREDOMINANTLY MONONUCLEAR NO ORGANISMS SEEN    Culture CULTURE REINCUBATED FOR BETTER  GROWTH  Final   Report Status PENDING  Incomplete     Labs: Basic Metabolic Panel:  Recent Labs Lab 05/04/16 1324 05/05/16 0425 05/08/16 0526 05/09/16 0607  NA 145 140 142 143  K 4.0 4.0 3.8 4.6  CL 106 102 103 103  CO2 31 26 32 32  GLUCOSE 99  222* 100* 110*  BUN 15 14 15 12   CREATININE 1.21 1.13 1.09 1.08  CALCIUM 9.2 8.9 8.5* 8.8*   Liver Function Tests:  Recent Labs Lab 05/04/16 1810 05/05/16 0425  AST 59* 48*  ALT 46 43  ALKPHOS 69 68  BILITOT 0.9 0.5  PROT 6.0* 6.5  ALBUMIN 3.5 3.5    Recent Labs Lab 05/04/16 1810  LIPASE 28   CBC:  Recent Labs Lab 05/04/16 1324 05/05/16 0425 05/08/16 0526 05/09/16 0607  WBC 7.6 5.7 11.7* 9.4  NEUTROABS  --   --  7.9* 6.9  HGB 16.1 15.1 16.0 16.8  HCT 49.6 46.7 48.5 51.2  MCV 93.8 94.0 92.7 92.3  PLT 183 163 191 190    IMAGING STUDIES Dg Chest 2 View  Result Date: 05/04/2016 CLINICAL DATA:  Chest pain. EXAM: CHEST  2 VIEW COMPARISON:  CT 02/20/2016.  Chest x-ray 02/20/2016, 02/10/2016. FINDINGS: Mediastinum and hilar structures are normal. Right lower lobe infiltrate noted consistent pneumonia. Small right pleural effusion. No pneumothorax. No acute bony abnormality. IMPRESSION: Right lower lobe infiltrate noted consistent pneumonia. Electronically Signed   By: Maisie Fushomas  Register   On: 05/04/2016 14:09   [place] Abdomen Complete  Result Date: 05/04/2016 CLINICAL DATA:  Acute onset of right upper quadrant abdominal pain. Initial encounter. EXAM: ABDOMEN ULTRASOUND COMPLETE COMPARISON:  Right upper tooth quadrant ultrasound performed 01/09/2016 FINDINGS: Gallbladder: No gallstones seen. Mild gallbladder wall thickening and trace pericholecystic fluid are noted, but no ultrasonographic Murphy's sign is elicited. Common bile duct: Diameter: 0.4 cm, within normal limits in caliber. Liver: No focal lesion identified. Within normal limits in parenchymal echogenicity. IVC: No abnormality visualized. Pancreas: Visualized portion unremarkable. Spleen: Size and appearance within normal limits. Right Kidney: Length: 11.3 cm. Echogenicity within normal limits. No mass or hydronephrosis visualized. Left Kidney: Length: 12.5 cm. Echogenicity within normal limits. No mass or hydronephrosis  visualized. Abdominal aorta: No aneurysm visualized. Scattered calcification is noted along the abdominal aorta. Other findings: Trace fluid is seen tracking about the liver and spleen. IMPRESSION: 1. Trace ascites noted tracking about the liver and spleen. 2. Mild gallbladder wall thickening and trace pericholecystic fluid are nonspecific in the presence of ascites. No ultrasonographic Murphy's sign elicited. Gallbladder otherwise unremarkable. 3. Scattered aortic atherosclerosis. Electronically Signed   By: Roanna RaiderJeffery  Chang M.D.   On: 05/04/2016 19:33   Dg Chest Port 1 View  Result Date: 05/08/2016 CLINICAL DATA:  60 year old male with a history of bronchoscopy EXAM: PORTABLE CHEST 1 VIEW COMPARISON:  05/04/2016, CT 02/20/2016 FINDINGS: Cardiomediastinal silhouette unchanged. Calcifications of the aortic arch. No pneumothorax. Persisting airspace disease at the right base with partial obscuration the right hemidiaphragm. Similar appearance to the comparison plain film and CT. No large pleural effusion. Healed rib fracture of the left lateral fourth, sixth, seventh rib. IMPRESSION: No evidence of pneumothorax status post bronchoscopy. Similar distribution and appearance of interstitial and airspace opacity of the right base partially obscuring the right hemidiaphragm. Aortic atherosclerosis. Signed, Yvone NeuJaime S. Loreta AveWagner, DO [facility] Electronically Signed   By: Gilmer MorJaime  Wagner D.O.   On: 05/08/2016 12:55    DISCHARGE EXAMINATION: Vitals:  05/09/16 0535 05/09/16 0735 05/09/16 0845 05/09/16 1130  BP: (!) 159/108 (!) 153/111  (!) 158/104  Pulse: 80 81  83  Resp: 18 16    Temp: 97.7 F (36.5 C) 98.1 F (36.7 C)  98 F (36.7 C)  TempSrc: Oral Oral  Oral  SpO2: 97% 95% 95% 95%  Weight:      Height:       General appearance: alert, cooperative, appears stated age and no distress Resp: Coarse breath sounds bilaterally. No wheezing, rales or  rhonchi. Cardio: regular rate and rhythm, S1, S2 normal, no murmur, click, rub or gallop GI: soft, non-tender; bowel sounds normal; no masses,  no organomegaly Extremities: extremities normal, atraumatic, no cyanosis or edema  DISPOSITION: Home  Discharge Instructions    Call MD for:  difficulty breathing, headache or visual disturbances    Complete by:  As directed    Call MD for:  extreme fatigue    Complete by:  As directed    Call MD for:  persistant dizziness or light-headedness    Complete by:  As directed    Call MD for:  persistant nausea and vomiting    Complete by:  As directed    Call MD for:  severe uncontrolled pain    Complete by:  As directed    Call MD for:  temperature >100.4    Complete by:  As directed    Diet - low sodium heart healthy    Complete by:  As directed    Discharge instructions    Complete by:  As directed    Please be sure to follow-up with your primary care provider. Appointment will be made for you to see a lung doctor as well.  You were cared for by a hospitalist during your hospital stay. If you have any questions about your discharge medications or the care you received while you were in the hospital after you are discharged, you can call the unit and asked to speak with the hospitalist on call if the hospitalist that took care of you is not available. Once you are discharged, your primary care physician will handle any further medical issues. Please note that NO REFILLS for any discharge medications will be authorized once you are discharged, as it is imperative that you return to your primary care physician (or establish a relationship with a primary care physician if you do not have one) for your aftercare needs so that they can reassess your need for medications and monitor your lab values. If you do not have a primary care physician, you can call [phone] for a physician referral.   Increase activity slowly    Complete by:  As directed        ALLERGIES:  Allergies  Allergen Reactions  . Lisinopril Cough     Discharge Medication List as of 05/09/2016 12:50 PM    START taking these medications   Details  azithromycin (ZITHROMAX) 500 MG tablet Take 1 tablet (500 mg total) by mouth daily., Starting Thu 05/10/2016, Until Sun 05/13/2016, Print    cefpodoxime (VANTIN) 200 MG tablet Take 1 tablet (200 mg total) by mouth every 12 (twelve) hours., Starting Wed 05/09/2016, Until Mon 05/14/2016, Print    nicotine (NICODERM CQ - DOSED IN MG/24 HOURS) 14 mg/24hr patch Place 1 patch (14 mg total) onto the skin daily., Starting Thu 05/10/2016, Print    predniSONE (DELTASONE) 20 MG tablet Take 2 tabs daily for 5 days, then take 1 tab daily for  5 days, THEN STOP., Print      CONTINUE these medications which have NOT CHANGED   Details  albuterol (PROVENTIL HFA;VENTOLIN HFA) 108 (90 Base) MCG/ACT inhaler Inhale 2 puffs into the lungs every 6 (six) hours as needed for wheezing or shortness of breath., Starting Fri 02/03/2016, Normal    aspirin 81 MG EC tablet Take 1 tablet (81 mg total) by mouth daily., Starting Mon 01/16/2016, No Print    carvedilol (COREG) 6.25 MG tablet Take 6.25 mg by mouth 2 (two) times daily with a meal., Historical Med    furosemide (LASIX) 40 MG tablet Take 1 tablet (40 mg total) by mouth daily., Starting Fri 02/24/2016, Normal    ibuprofen (ADVIL,MOTRIN) 600 MG tablet Take 600 mg by mouth every 6 (six) hours as needed for headache., Historical Med    sacubitril-valsartan (ENTRESTO) 49-51 MG Take 1 tablet by mouth 2 (two) times daily., Starting Fri 02/24/2016, Normal    budesonide-formoterol (SYMBICORT) 160-4.5 MCG/ACT inhaler Inhale 2 puffs into the lungs 2 (two) times daily., Starting Fri 02/03/2016, Normal    potassium chloride SA (K-DUR,KLOR-CON) 20 MEQ tablet Take 2 tablets (40 mEq total) by mouth daily., Starting Fri 03/02/2016, Normal         Follow-up Information    [facility]. Go on 05/14/2016.   Why:  appointment scheduled for 4 pm Contact information: [address] [place] [address] [phone]       Sandrea Hughs, MD Follow up on 05/28/2016.   Specialty:  Pulmonary Disease Why:  1:30pm  Contact information: [address] [place] [address] [phone]           TOTAL DISCHARGE TIME: 35 minutes  [facility]  [facility] Pager [phone]  05/09/2016, 2:46 PM

## 2016-05-09 NOTE — Progress Notes (Signed)
Rxs faxed to [facility] pharmacy to fill for pick up today. Patient aware they are closed btwn 1-2pm

## 2016-05-09 NOTE — Progress Notes (Signed)
Patient was discharged home by MD order; discharged instructions  review and give to patient with care notes and prescriptions; IV DIC; patient will be escorted to the car by nurse tech via wheelchair.  

## 2016-05-09 NOTE — Progress Notes (Signed)
   Name: Jared [name] MRN: [id number] DOB: 1955/07/20    ADMISSION DATE:  05/04/2016 CONSULTATION DATE:  05/06/16  REFERRING MD :  [name]  CHIEF COMPLAINT:  hemoptysis  BRIEF PATIENT DESCRIPTION: 60 yoM smoker with streak hemoptysis since September, cough, RLL infiltrate.  FOB 11/28  SIGNIFICANT EVENTS    STUDIES:  CT chest 9/17   HISTORY OF PRESENT ILLNESS:  60 yoM smoker with streak hemoptysis since Sept   SUBJECTIVE:  No c/o.  Wants to go home.  Denies hemoptysis today.   VITAL SIGNS: Temp:  [97.4 F (36.3 C)-98.7 F (37.1 C)] 98.1 F (36.7 C) (11/29 0735) Pulse Rate:  [57-109] 81 (11/29 0735) Resp:  [0-27] 16 (11/29 0735) BP: (138-186)/(79-126) 153/111 (11/29 0735) SpO2:  [87 %-97 %] 95 % (11/29 0845) Weight:  [84.7 kg (186 lb 11.4 oz)-86.2 kg (190 lb)] 84.7 kg (186 lb 11.4 oz) (11/29 0500)  PHYSICAL EXAMINATION: General:  Pleasant, Alert M, NAD, lying in bed Neuro:  Oriented, coop, non-focal HEENT:  No stridor or JVD Cardiovascular:  RRR, no m/g/r Lungs: few scattered Crackles R lower lung, unlabored , nasal prongs Abdomen:  Scaphoid, no HSM Musculoskeletal:  Normal symmetrical bulk Skin:  No rash or bruising   Recent Labs Lab 05/05/16 0425 05/08/16 0526 05/09/16 0607  NA 140 142 143  K 4.0 3.8 4.6  CL 102 103 103  CO2 26 32 32  BUN 14 15 12   CREATININE 1.13 1.09 1.08  GLUCOSE 222* 100* 110*    Recent Labs Lab 05/05/16 0425 05/08/16 0526 05/09/16 0607  HGB 15.1 16.0 16.8  HCT 46.7 48.5 51.2  WBC 5.7 11.7* 9.4  PLT 163 191 190   Dg Chest Port 1 View  Result Date: 05/08/2016 CLINICAL DATA:  60 year old male with a history of bronchoscopy EXAM: PORTABLE CHEST 1 VIEW COMPARISON:  05/04/2016, CT 02/20/2016 FINDINGS: Cardiomediastinal silhouette unchanged. Calcifications of the aortic arch. No pneumothorax. Persisting airspace disease at the right base with partial obscuration the right hemidiaphragm. Similar appearance to the comparison  plain film and CT. No large pleural effusion. Healed rib fracture of the left lateral fourth, sixth, seventh rib. IMPRESSION: No evidence of pneumothorax status post bronchoscopy. Similar distribution and appearance of interstitial and airspace opacity of the right base partially obscuring the right hemidiaphragm. Aortic atherosclerosis. Signed, Yvone Neu. Loreta Ave, DO [facility] Electronically Signed   By: Gilmer Mor D.O.   On: 05/08/2016 12:55    ASSESSMENT / PLAN:  Hemoptysis- Concerning for possible cancer in this smoker. He says bleeding has persisted since September, intermittently. Bronchoscopy performed 11/28.  Results pending. Respiratory status stable.    RLL infiltrate. Unclear if this ever resolved after September presentation. Possible gastric aspiration, but he denies.   Ok for discharge from [employer] standpoint.  Will f/u with Dr. [name] as outpt.   Dirk Dress, NP 05/09/2016  10:25 AM Pager: [phone] or [phone]

## 2016-05-09 NOTE — Discharge Instructions (Signed)
Community-Acquired Pneumonia, Adult °Pneumonia is an infection of the lungs. There are different types of pneumonia. One type can develop while a person is in a hospital. A different type, called community-acquired pneumonia, develops in people who are not, or have not recently been, in the hospital or other health care facility. °What are the causes? °Pneumonia may be caused by bacteria, viruses, or funguses. Community-acquired pneumonia is often caused by Streptococcus pneumonia bacteria. These bacteria are often passed from one person to another by breathing in droplets from the cough or sneeze of an infected person. °What increases the risk? °The condition is more likely to develop in: °· People who have chronic diseases, such as chronic obstructive pulmonary disease (COPD), asthma, congestive heart failure, cystic fibrosis, diabetes, or kidney disease. °· People who have early-stage or late-stage HIV. °· People who have sickle cell disease. °· People who have had their spleen removed (splenectomy). °· People who have poor dental hygiene. °· People who have medical conditions that increase the risk of breathing in (aspirating) secretions their own mouth and nose. °· People who have a weakened immune system (immunocompromised). °· People who smoke. °· People who travel to areas where pneumonia-causing germs commonly exist. °· People who are around animal habitats or animals that have pneumonia-causing germs, including birds, bats, rabbits, cats, and farm animals. ° °What are the signs or symptoms? °Symptoms of this condition include: °· A dry cough. °· A wet (productive) cough. °· Fever. °· Sweating. °· Chest pain, especially when breathing deeply or coughing. °· Rapid breathing or difficulty breathing. °· Shortness of breath. °· Shaking chills. °· Fatigue. °· Muscle aches. ° °How is this diagnosed? °Your health care provider will take a medical history and perform a physical exam. You may also have other tests,  including: °· Imaging studies of your chest, including X-rays. °· Tests to check your blood oxygen level and other blood gases. °· Other tests on blood, mucus (sputum), fluid around your lungs (pleural fluid), and urine. ° °If your pneumonia is severe, other tests may be done to identify the specific cause of your illness. °How is this treated? °The type of treatment that you receive depends on many factors, such as the cause of your pneumonia, the medicines you take, and other medical conditions that you have. For most adults, treatment and recovery from pneumonia may occur at home. In some cases, treatment must happen in a hospital. Treatment may include: °· Antibiotic medicines, if the pneumonia was caused by bacteria. °· Antiviral medicines, if the pneumonia was caused by a virus. °· Medicines that are given by mouth or through an IV tube. °· Oxygen. °· Respiratory therapy. ° °Although rare, treating severe pneumonia may include: °· Mechanical ventilation. This is done if you are not breathing well on your own and you cannot maintain a safe blood oxygen level. °· Thoracentesis. This procedure removes fluid around one lung or both lungs to help you breathe better. ° °Follow these instructions at home: °· Take over-the-counter and prescription medicines only as told by your health care provider. °? Only take cough medicine if you are losing sleep. Understand that cough medicine can prevent your body’s natural ability to remove mucus from your lungs. °? If you were prescribed an antibiotic medicine, take it as told by your health care provider. Do not stop taking the antibiotic even if you start to feel better. °· Sleep in a semi-upright position at night. Try sleeping in a reclining chair, or place a few pillows under your head. °· Do not use tobacco products, including cigarettes, chewing   tobacco, and e-cigarettes. If you need help quitting, ask your health care provider. °· Drink enough water to keep your urine  clear or pale yellow. This will help to thin out mucus secretions in your lungs. °How is this prevented? °There are ways that you can decrease your risk of developing community-acquired pneumonia. Consider getting a pneumococcal vaccine if: °· You are older than 62 years of age. °· You are older than 62 years of age and are undergoing cancer treatment, have chronic lung disease, or have other medical conditions that affect your immune system. Ask your health care provider if this applies to you. ° °There are different types and schedules of pneumococcal vaccines. Ask your health care provider which vaccination option is best for you. °You may also prevent community-acquired pneumonia if you take these actions: °· Get an influenza vaccine every year. Ask your health care provider which type of influenza vaccine is best for you. °· Go to the dentist on a regular basis. °· Wash your hands often. Use hand sanitizer if soap and water are not available. ° °Contact a health care provider if: °· You have a fever. °· You are losing sleep because you cannot control your cough with cough medicine. °Get help right away if: °· You have worsening shortness of breath. °· You have increased chest pain. °· Your sickness becomes worse, especially if you are an older adult or have a weakened immune system. °· You cough up blood. °This information is not intended to replace advice given to you by your health care provider. Make sure you discuss any questions you have with your health care provider. °Document Released: 05/28/2005 Document Revised: 10/06/2015 Document Reviewed: 09/22/2014 °Elsevier Interactive Patient Education © 2017 [employer]vier Inc. ° °

## 2016-05-10 LAB — CULTURE, BAL-QUANTITATIVE W GRAM STAIN: Culture: 20000 — AB

## 2016-05-14 ENCOUNTER — Encounter: Payer: Self-pay | Admitting: Family Medicine

## 2016-05-14 ENCOUNTER — Ambulatory Visit: Payer: Self-pay | Attending: Family Medicine | Admitting: Family Medicine

## 2016-05-14 VITALS — BP 131/86 | HR 99 | Temp 98.3°F | Ht 75.0 in | Wt 190.6 lb

## 2016-05-14 DIAGNOSIS — J189 Pneumonia, unspecified organism: Secondary | ICD-10-CM | POA: Insufficient documentation

## 2016-05-14 DIAGNOSIS — E119 Type 2 diabetes mellitus without complications: Secondary | ICD-10-CM | POA: Insufficient documentation

## 2016-05-14 DIAGNOSIS — Z9889 Other specified postprocedural states: Secondary | ICD-10-CM | POA: Insufficient documentation

## 2016-05-14 DIAGNOSIS — J440 Chronic obstructive pulmonary disease with acute lower respiratory infection: Secondary | ICD-10-CM | POA: Insufficient documentation

## 2016-05-14 DIAGNOSIS — Z79899 Other long term (current) drug therapy: Secondary | ICD-10-CM | POA: Insufficient documentation

## 2016-05-14 DIAGNOSIS — R7303 Prediabetes: Secondary | ICD-10-CM | POA: Insufficient documentation

## 2016-05-14 DIAGNOSIS — Z8673 Personal history of transient ischemic attack (TIA), and cerebral infarction without residual deficits: Secondary | ICD-10-CM | POA: Insufficient documentation

## 2016-05-14 DIAGNOSIS — J438 Other emphysema: Secondary | ICD-10-CM

## 2016-05-14 DIAGNOSIS — Z7982 Long term (current) use of aspirin: Secondary | ICD-10-CM | POA: Insufficient documentation

## 2016-05-14 DIAGNOSIS — I110 Hypertensive heart disease with heart failure: Secondary | ICD-10-CM | POA: Insufficient documentation

## 2016-05-14 DIAGNOSIS — F17210 Nicotine dependence, cigarettes, uncomplicated: Secondary | ICD-10-CM | POA: Insufficient documentation

## 2016-05-14 DIAGNOSIS — I429 Cardiomyopathy, unspecified: Secondary | ICD-10-CM

## 2016-05-14 DIAGNOSIS — I420 Dilated cardiomyopathy: Secondary | ICD-10-CM | POA: Insufficient documentation

## 2016-05-14 DIAGNOSIS — J181 Lobar pneumonia, unspecified organism: Secondary | ICD-10-CM

## 2016-05-14 DIAGNOSIS — I5042 Chronic combined systolic (congestive) and diastolic (congestive) heart failure: Secondary | ICD-10-CM | POA: Insufficient documentation

## 2016-05-14 DIAGNOSIS — I2510 Atherosclerotic heart disease of native coronary artery without angina pectoris: Secondary | ICD-10-CM | POA: Insufficient documentation

## 2016-05-14 HISTORY — DX: Type 2 diabetes mellitus without complications: E11.9

## 2016-05-14 LAB — POCT GLYCOSYLATED HEMOGLOBIN (HGB A1C): Hemoglobin A1C: 6.3

## 2016-05-14 NOTE — Progress Notes (Signed)
Wasn't able to get medication prescribed when leaving the hospital

## 2016-05-14 NOTE — Patient Instructions (Signed)
Steps to Quit Smoking Smoking tobacco can be harmful to your health and can affect almost every organ in your body. Smoking puts you, and those around you, at risk for developing many serious chronic diseases. Quitting smoking is difficult, but it is one of the best things that you can do for your health. It is never too late to quit. What are the benefits of quitting smoking? When you quit smoking, you lower your risk of developing serious diseases and conditions, such as:  Lung cancer or lung disease, such as COPD.  Heart disease.  Stroke.  Heart attack.  Infertility.  Osteoporosis and bone fractures.  Additionally, symptoms such as coughing, wheezing, and shortness of breath may get better when you quit. You may also find that you get sick less often because your body is stronger at fighting off colds and infections. If you are pregnant, quitting smoking can help to reduce your chances of having a baby of low birth weight. How do I get ready to quit? When you decide to quit smoking, create a plan to make sure that you are successful. Before you quit:  Pick a date to quit. Set a date within the next two weeks to give you time to prepare.  Write down the reasons why you are quitting. Keep this list in places where you will see it often, such as on your bathroom mirror or in your car or wallet.  Identify the people, places, things, and activities that make you want to smoke (triggers) and avoid them. Make sure to take these actions: ? Throw away all cigarettes at home, at work, and in your car. ? Throw away smoking accessories, such as ashtrays and lighters. ? Clean your car and make sure to empty the ashtray. ? Clean your home, including curtains and carpets.  Tell your family, friends, and coworkers that you are quitting. Support from your loved ones can make quitting easier.  Talk with your health care provider about your options for quitting smoking.  Find out what treatment  options are covered by your health insurance.  What strategies can I use to quit smoking? Talk with your healthcare provider about different strategies to quit smoking. Some strategies include:  Quitting smoking altogether instead of gradually lessening how much you smoke over a period of time. Research shows that quitting "cold turkey" is more successful than gradually quitting.  Attending in-person counseling to help you build problem-solving skills. You are more likely to have success in quitting if you attend several counseling sessions. Even short sessions of 10 minutes can be effective.  Finding resources and support systems that can help you to quit smoking and remain smoke-free after you quit. These resources are most helpful when you use them often. They can include: ? Online chats with a counselor. ? Telephone quitlines. ? Printed self-help materials. ? Support groups or group counseling. ? Text messaging programs. ? Mobile phone applications.  Taking medicines to help you quit smoking. (If you are pregnant or breastfeeding, talk with your health care provider first.) Some medicines contain nicotine and some do not. Both types of medicines help with cravings, but the medicines that include nicotine help to relieve withdrawal symptoms. Your health care provider may recommend: ? Nicotine patches, gum, or lozenges. ? Nicotine inhalers or sprays. ? Non-nicotine medicine that is taken by mouth.  Talk with your health care provider about combining strategies, such as taking medicines while you are also receiving in-person counseling. Using these two strategies together   makes you more likely to succeed in quitting than if you used either strategy on its own. If you are pregnant or breastfeeding, talk with your health care provider about finding counseling or other support strategies to quit smoking. Do not take medicine to help you quit smoking unless told to do so by your health care  provider. What things can I do to make it easier to quit? Quitting smoking might feel overwhelming at first, but there is a lot that you can do to make it easier. Take these important actions:  Reach out to your family and friends and ask that they support and encourage you during this time. Call telephone quitlines, reach out to support groups, or work with a counselor for support.  Ask people who smoke to avoid smoking around you.  Avoid places that trigger you to smoke, such as bars, parties, or smoke-break areas at work.  Spend time around people who do not smoke.  Lessen stress in your life, because stress can be a smoking trigger for some people. To lessen stress, try: ? Exercising regularly. ? Deep-breathing exercises. ? Yoga. ? Meditating. ? Performing a body scan. This involves closing your eyes, scanning your body from head to toe, and noticing which parts of your body are particularly tense. Purposefully relax the muscles in those areas.  Download or purchase mobile phone or tablet apps (applications) that can help you stick to your quit plan by providing reminders, tips, and encouragement. There are many free apps, such as QuitGuide from the CDC (Centers for Disease Control and Prevention). You can find other support for quitting smoking (smoking cessation) through smokefree.gov and other websites.  How will I feel when I quit smoking? Within the first 24 hours of quitting smoking, you may start to feel some withdrawal symptoms. These symptoms are usually most noticeable 2-3 days after quitting, but they usually do not last beyond 2-3 weeks. Changes or symptoms that you might experience include:  Mood swings.  Restlessness, anxiety, or irritation.  Difficulty concentrating.  Dizziness.  Strong cravings for sugary foods in addition to nicotine.  Mild weight gain.  Constipation.  Nausea.  Coughing or a sore throat.  Changes in how your medicines work in your  body.  A depressed mood.  Difficulty sleeping (insomnia).  After the first 2-3 weeks of quitting, you may start to notice more positive results, such as:  Improved sense of smell and taste.  Decreased coughing and sore throat.  Slower heart rate.  Lower blood pressure.  Clearer skin.  The ability to breathe more easily.  Fewer sick days.  Quitting smoking is very challenging for most people. Do not get discouraged if you are not successful the first time. Some people need to make many attempts to quit before they achieve long-term success. Do your best to stick to your quit plan, and talk with your health care provider if you have any questions or concerns. This information is not intended to replace advice given to you by your health care provider. Make sure you discuss any questions you have with your health care provider. Document Released: 05/22/2001 Document Revised: 01/24/2016 Document Reviewed: 10/12/2014 Elsevier Interactive Patient Education  2017 Elsevier Inc.  

## 2016-05-15 NOTE — Progress Notes (Signed)
Subjective:  Patient ID: Jared [name], male    DOB: May 06, 1956  Age: 60 y.o. MRN: [id number]  CC: Hospitalization Follow-up; Pneumonia; COPD; Hypertension; and Cough (productive- grey phlegm)   HPI Jared [name] s a 60 year old male with a history of hypertension, COPD, previous history of stroke, CAD status post CABG, CHF (EF 35-40%) who comes in for follow-up From hospitalization at [facility] from 05/04/16 through 05/09/16 where he was admitted for community-acquired pneumonia of the right lower lobe of the lung.  He had presented with worsening shortness of breath, chest x-ray was in keeping with right lower lobe infiltrate (of note he had similar findings 2 months prior). 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 He was treated with IV Rocephin and azithromycin and subsequently discharged on Vantin and azithromycin as well as a prednisone taper.  He is yet to pick up his medications which were prescribed at discharge due to cost. Denies fever, cough but has baseline shortness of breath on mild-to-moderate exertion and on dressing himself but denies chest pain. Continues to smoke and is not ready to quit at this time. Follow-up appointment with pulmonary is on 05/21/2016  Past Medical History:  Diagnosis Date  . CAD (coronary artery disease)    a. LHC 5/12:  LAD 20, pLCx 20, pRCA 40, dRCA 40, EF 35%, diff HK  //  b. Myoview 4/16: Overall Impression: High risk stress nuclear study There is no evidence of ischemia. There is severe LV dysfunction. LV Ejection Fraction: 30%. LV Wall Motion: There is global LV hypokinesis.   Marland Kitchen CAP (community acquired pneumonia) 09/2013  . Chronic combined systolic and diastolic CHF (congestive heart failure) (HCC)    a. Echo 4/16:Mild LVH, EF 40-45%, diffuse HK //  b. Echo 8/17: EF 35-40%, diffuse HK, diastolic dysfunction, aortic sclerosis, trivial MR, moderate LAE, normal RVSF, moderate RAE,  mild TR, PASP 42 mmHg  . Cluster headache    "hx; haven't had one in awhile" (01/09/2016)  . COPD (chronic obstructive pulmonary disease) (HCC)    [name] 01/09/2016  . History of CVA (cerebrovascular accident)   . Hypertension   . NICM (nonischemic cardiomyopathy) (HCC)   . Tobacco abuse     Past Surgical History:  Procedure Laterality Date  . CARDIAC CATHETERIZATION  10/2010   LM normal, LAD with 20% irregularities, LCX with 20%, RCA with 40% prox and 40% distal - EF of 35%  . COLONOSCOPY W/ BIOPSIES AND POLYPECTOMY    . VIDEO BRONCHOSCOPY Bilateral 05/08/2016   Procedure: VIDEO BRONCHOSCOPY WITH FLUORO;  Surgeon: Oretha Milch, MD;  Location: [facility] ENDOSCOPY;  Service: Cardiopulmonary;  Laterality: Bilateral;     Outpatient Medications Prior to Visit  Medication Sig Dispense Refill  . albuterol (PROVENTIL HFA;VENTOLIN HFA) 108 (90 Base) MCG/ACT inhaler Inhale 2 puffs into the lungs every 6 (six) hours as needed for wheezing or shortness of breath. 1 Inhaler 3  . aspirin 81 MG EC tablet Take 1 tablet (81 mg total) by mouth daily. 30 tablet 3  . furosemide (LASIX) 40 MG tablet Take 1 tablet (40 mg total) by mouth daily. 90 tablet 3  . potassium chloride SA (K-DUR,KLOR-CON) 20 MEQ tablet Take 2 tablets (40 mEq total) by mouth daily. 60 tablet 0  . sacubitril-valsartan (ENTRESTO) 49-51 MG Take 1 tablet by mouth 2 (two) times daily. 180 tablet 3  . budesonide-formoterol (SYMBICORT) 160-4.5 MCG/ACT inhaler Inhale 2 puffs into the lungs 2 (two) times daily. (Patient not taking:  Reported on 05/14/2016) 1 Inhaler 0  . carvedilol (COREG) 6.25 MG tablet Take 6.25 mg by mouth 2 (two) times daily with a meal.    . cefpodoxime (VANTIN) 200 MG tablet Take 1 tablet (200 mg total) by mouth every 12 (twelve) hours. (Patient not taking: Reported on 05/14/2016) 10 tablet 0  . ibuprofen (ADVIL,MOTRIN) 600 MG tablet Take 600 mg by mouth every 6 (six) hours as needed for headache.    . nicotine (NICODERM CQ -  DOSED IN MG/24 HOURS) 14 mg/24hr patch Place 1 patch (14 mg total) onto the skin daily. (Patient not taking: Reported on 05/14/2016) 28 patch 0  . predniSONE (DELTASONE) 20 MG tablet Take 2 tabs daily for 5 days, then take 1 tab daily for 5 days, THEN STOP. (Patient not taking: Reported on 05/14/2016) 15 tablet 0   No facility-administered medications prior to visit.     ROS Review of Systems Constitutional: Negative for activity change and appetite change.  HENT: Negative for sinus pressure and sore throat.   Eyes: Negative for visual disturbance.  Respiratory: Positive for shortness of breath. Negative for cough and chest tightness.   Cardiovascular: Negative for chest pain and leg swelling.  Gastrointestinal: Negative for abdominal distention, abdominal pain, constipation and diarrhea.  Endocrine: Negative.   Genitourinary: Negative for dysuria.  Musculoskeletal: Negative for joint swelling and myalgias.  Skin: Negative for rash.  Allergic/Immunologic: Negative.   Neurological: Negative for weakness, light-headedness and numbness.  Psychiatric/Behavioral: Negative for dysphoric mood and suicidal ideas.   Objective:  BP 131/86 (BP Location: Right Arm, Patient Position: Sitting, Cuff Size: Large)   Pulse 99   Temp 98.3 F (36.8 C) (Oral)   Ht 6\' 3"  (1.905 m)   Wt 190 lb 9.6 oz (86.5 kg)   SpO2 95%   BMI 23.82 kg/m   BP/Weight 05/14/2016 05/09/2016 02/29/2016  Systolic BP 131 158 162  Diastolic BP 86 104 84  Wt. (Lbs) 190.6 186.71 177.8  BMI 23.82 23.34 22.22      Physical Exam  Constitutional: He is oriented to person, place, and time. He appears well-developed and well-nourished.  Cardiovascular: Normal rate, normal heart sounds and intact distal pulses.   No murmur heard. Pulmonary/Chest: Effort normal and breath sounds normal. He has no wheezes. He has no rales. He exhibits no tenderness.  Abdominal: Soft. Bowel sounds are normal. He exhibits no distension and no mass.  There is no tenderness.  Musculoskeletal: Normal range of motion.  Neurological: He is alert and oriented to person, place, and time.  Skin: Skin is warm and dry.  Psychiatric: He has a normal mood and affect.    Lab Results  Component Value Date   HGBA1C 6.3 05/14/2016    CLINICAL DATA:  60 year old male with a history of bronchoscopy  EXAM: PORTABLE CHEST 1 VIEW  COMPARISON:  05/04/2016, CT 02/20/2016  FINDINGS: Cardiomediastinal silhouette unchanged. Calcifications of the aortic arch.  No pneumothorax.  Persisting airspace disease at the right base with partial obscuration the right hemidiaphragm. Similar appearance to the comparison plain film and CT.  No large pleural effusion.  Healed rib fracture of the left lateral fourth, sixth, seventh rib.  IMPRESSION: No evidence of pneumothorax status post bronchoscopy.  Similar distribution and appearance of interstitial and airspace opacity of the right base partially obscuring the right hemidiaphragm.  Aortic atherosclerosis.  Signed,  Yvone Neu. Loreta Ave, DO  [facility]  [facility]   Electronically Signed   By: Marijean Niemann  Loreta AveWagner D.O.   On: 05/08/2016 12:55   Assessment & Plan:   1. Prediabetes Discussed diabetic diet and lifestyle modification to prevent development of diabetes - HgB A1c  2. 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 Community acquired pneumonia of right lower lobe of lung (HCC) Clinically stable, oxygen saturation is 95% Strongly advised to present to the pharmacy on site as they will work with the patient even though he has no copay for his meds Will need to repeat chest x-ray at next visit to assess for resolution of infiltrate  4. Cigarette smoker Spent 3 minutes counseling on cessation and he is not  ready to quit at this time.  5. Other emphysema (HCC) No acute exacerbation Continue Symbicort and Proventil MDI   No orders of the defined types were placed in this encounter.   Follow-up: Return in about 3 weeks (around 06/04/2016) for Bronchopneumonia.   Jaclyn ShaggyEnobong Amao MD

## 2016-05-21 MED FILL — CEFPODOXIME 200 MG TABLET: 200 | 5 days supply | Qty: 10 | Fill #0

## 2016-05-21 MED FILL — SYMBICORT 160-4.5 MCG INH: 160-4.5 | 30 days supply | Qty: 10 | Fill #0

## 2016-05-21 MED FILL — predniSONE 20 MG TABS: 20 | 10 days supply | Qty: 15 | Fill #0

## 2016-05-21 MED FILL — ?AZITHROMYCIN 500 MG TABLET: 500 MG | 3 days supply | Qty: 3 | Fill #0

## 2016-05-28 ENCOUNTER — Ambulatory Visit (INDEPENDENT_AMBULATORY_CARE_PROVIDER_SITE_OTHER)
Admission: RE | Admit: 2016-05-28 | Discharge: 2016-05-28 | Disposition: A | Discharge status: Home / Self Care | Payer: Self-pay | Source: Ambulatory Visit | Attending: Internal Medicine | Admitting: Internal Medicine

## 2016-05-28 ENCOUNTER — Encounter: Payer: Self-pay | Admitting: Internal Medicine

## 2016-05-28 ENCOUNTER — Ambulatory Visit (INDEPENDENT_AMBULATORY_CARE_PROVIDER_SITE_OTHER): Payer: Self-pay | Admitting: Internal Medicine

## 2016-05-28 VITALS — BP 150/86 | HR 76 | Ht 75.0 in | Wt 193.6 lb

## 2016-05-28 DIAGNOSIS — R918 Other nonspecific abnormal finding of lung field: Secondary | ICD-10-CM

## 2016-05-28 DIAGNOSIS — J449 Chronic obstructive pulmonary disease, unspecified: Secondary | ICD-10-CM

## 2016-05-28 DIAGNOSIS — F17210 Nicotine dependence, cigarettes, uncomplicated: Secondary | ICD-10-CM

## 2016-05-28 NOTE — Assessment & Plan Note (Addendum)
New on 01/02/16 vs 08/18/15  Dx 02/20/16 ER ? Pna  > Levaquin > repeat cxr due 1st week in Oct 2017  - FOB RLL tbbx ATYPICAL PNEUMOCYTES PROLIFERATION/ LLL BAL 05/08/16  - cxr 05/28/2016 95% clear > rec f/u in 3 m  Discussed in detail all the  indications, usual  risks and alternatives  relative to the benefits with patient who agrees to proceed with conservative f/u as outlined   Comment: Radiology is reading this as clear but I can still see slight residual but even if this proves to be ca clearly this is not a setting where early surgery will help (would not tol RLLobectomy due to copd severity.

## 2016-05-28 NOTE — Patient Instructions (Addendum)
Please remember to go to the x-ray department downstairs for your tests - we will call you with the results when they are available.     Please schedule a follow up visit in 3 months but call sooner if needed with cxr on return  Late add :  Move up f/u to 4 weeks with ct chest first

## 2016-05-28 NOTE — Assessment & Plan Note (Signed)
>   3 min Discussed the risks and costs (both direct and indirect)  of smoking relative to the benefits of quitting but patient unwilling to commit at this point to a specific quit date.    Although I don't endorse regular use of e cigs/ many pts find them helpful; however, I emphasized they should be considered a "one-way bridge" off all tobacco products.  

## 2016-05-28 NOTE — Progress Notes (Signed)
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 Follow up with PCP in 1-2 weeks 2. Please obtain BMP/CBC in one week   Home Health:NO Equipment/Devices: NONE  Discharge Condition:Stable CODE STATUS:FULL Diet recommendation: Heart Healthy   Brief/Interim Summary: 60 y.o.malewith medical history significant for CABG, hypertension, prior history of stroke without residual, systolic heart failure, current tobacco abuse, chronic headaches, presenting to the emergency department with generalized weakness, and increased shortness of breath, along with white sputum production. He also reports dyspnea on exertion. In addition, he reports epigastric abdominal pain, which may radiate to the lower portion of his sternum versus chest pain . He denies any lower extremity edema. Admits to salt rich food ingestion .The symptoms are similar to those of one week ago, at which time, workup was suspicious for acute exacerbation of systolic heart failure. She was started on Cozaar, metoprolol and Lasix 20 mg daily at the time, and was recommended to see cardiology, which he failed to do so. His cardiologist is Dr. Dietrich [name], last seen in December 2016.the patient continues to smoke 1 pack a day of cigarettes  Discharge Diagnoses:  Acute respiratory failure secondary to CHF exacerbation -O2 sats upon admission 89% -CXR unremarkable for infection -Responded well to IVlasix-->po lasix -Weaned off of O2-SpO2 95% on room air  Acute combined systolic/diastolic heart failure -BNP 2056.8 -Echocardiogram  09/20/2014 showed EF 40-45% -Transition to by mouth Lasix 20 mg daily -Echocardiogram 01/10/2016: EF 35-40%, diastolic dysfunction --AST 147, ALT 110 upon admission -Currently trending downward -hepatitis panel unremarkable -Patient denies drug/alcohol use -switch metoprolol-->coreg -continue losartan  Essential Hypertension -Has not been taking home medications for over a year due to lack of insurance and monetary funds -Restarted on losartan, Lasix --switch metoprolol-->coreg 6.25 mg bid  CAD -Currently no chest pain -Continue coreg, losartan, ASA 81 mg daily -LDL 80  Tobacco Abuse -Smoking cessation discussed -Continue nicotine patch  Moderate malnutrition -Nutrition consult   History of Present Illness  02/29/2016 1st [facility] office visit/ Jared [name]  symb 160 2bid/ bid saba (not prn)  Chief Complaint  Patient presents with  . Pulmonary Consult    Pt referred by Jared Newcomer, PA, for cough, hemoptysis, and emphysema. Pt c/o hemoptysis x 3 weeks, pt states this occurs daily but has been improving. Pt c/o increase in SOB with activity and rest.   acute  onset epistaxis/ hemoptyis end of Auguest > ER eval 02/20/16 dx RLL pna by CTa chest > rx levaquin/ prednisone and minimal hemoptysis now  Doe = MMRC2 = can't walk a nl pace on a flat grade s sob but does fine slow and flat eg [employer] shopping rec Plan A = Automatic =  Symbicort 160 Take 2 puffs first thing in am and then another 2 puffs about 12 hours later.  Work on inhaler technique: Plan B = Backup Only use your albuterol as a rescue medication\ Plan C = Crisis - only use your albuterol nebulizer if you first try Plan B and it fails to help > ok to use the nebulizer  up to every 4 hours but if start needing it regularly call for immediate appointment  The key is to stop smoking completely before smoking completely stops you!  Please schedule a follow up office visit in 2 weeks, sooner if needed with cxr on return >  did not return   Admit date: 05/04/2016 Discharge date: 05/09/2016  PCP: Jared [name], Amao, MD  DISCHARGE DIAGNOSES:  Principal Problem:   Hemoptysis Active Problems:   Community acquired pneumonia of right lower lobe of lung (HCC) - - FOB RLL tbbx ATYPICAL PNEUMOCYTES PROLIFERATION/ LLL BAL 05/08/16    Hypertension   COPD with acute exacerbation (HCC)   Nonischemic dilated cardiomyopathy (HCC)   Cigarette smoker   Chronic systolic CHF (congestive heart failure) (HCC)     05/28/2016  f/u ov/Jared [name] re: post hosp for ? pna / GOLD III copd / still smoking / maint rx symb 160 2bid /saba bid Chief Complaint  Patient presents with  . Hospitalization Follow-up    His breathing has improved. He still has cough and mucus is now clear. He is using rescue inhaler 2 x daily on average.    back to baseline doe = MMRC2 = can't walk a nl pace on a flat grade s sob but does fine slow and flat eg shopping is ok No more hemoptysis and never had purulent sputum  No obvious day to day or daytime variability or assoc  cp or chest tightness, subjective wheeze or overt sinus or hb symptoms. No unusual exp hx or h/o childhood pna/ asthma or knowledge of premature birth.  Sleeping ok without nocturnal  or early am exacerbation  of respiratory  c/o's or need for noct saba. Also denies any obvious fluctuation of symptoms with weather or environmental changes or other aggravating or alleviating factors except as outlined above   Current Medications, Allergies, Complete Past Medical History, Past Surgical History, Family History, and Social History were reviewed in [facility] electronic medical record.  ROS  The following are not active complaints unless bolded sore throat, dysphagia, dental problems, itching, sneezing,  nasal congestion or excess/ purulent secretions, ear ache,   fever, chills, sweats, unintended wt loss, classically pleuritic or exertional cp,  orthopnea pnd or leg swelling, presyncope,  palpitations, abdominal pain, anorexia, nausea, vomiting, diarrhea  or change in bowel or bladder habits, change in stools or urine, dysuria,hematuria,  rash, arthralgias, visual complaints, headache, numbness, weakness or ataxia or problems with walking or coordination,  change in mood/affect or memory.                 Objective:   Physical Exam  amb bm nad > stated age   05/28/2016      194   02/29/16 177 lb 12.8 oz (80.6 kg)  02/24/16 182 lb 12.8 oz (82.9 kg)  02/20/16 175 lb (79.4 kg)    Vital signs reviewed - sats 97% on RA on arrival    HEENT: nl   oropharynx. Nl external ear canals without cough reflex -  Poor dentition/ moderate bilateral non-specific turbinate edema     NECK :  without JVD/Nodes/TM/ nl carotid upstrokes bilaterally   LUNGS: no acc muscle use,  slt barrel  contour chest  With distant bs/ minimal bilateral exp rhonchi   CV:  RRR  no s3 or murmur or increase in P2, no edema   ABD:  soft and nontender with end inspiratory hoover's in the supine position. No bruits or organomegaly, bowel sounds nl  MS:  Nl gait/ ext warm without deformities, calf tenderness, cyanosis or clubbing No obvious joint restrictions   SKIN: warm and dry without lesions    NEURO:  alert, approp, nl sensorium with  no motor deficits     CXR PA and Lateral:   05/28/2016 :    I personally reviewed images and agree with radiology impression as follows:   Near complete resolution of right lower lobe infiltrate. No effusion. Lungs are now clear.  COPD           Assessment & Plan:

## 2016-05-28 NOTE — Assessment & Plan Note (Addendum)
02/29/2016  After extensive coaching HFA effectiveness =    75% > continue symbicort  - Spirometry 05/28/2016  FEV1 1.48 (40%)  Ratio 46  p am symbicort 160 2bid   He has severe copd but well compensated on symb 160 2bid despite still smoking (see separate a/p)   I had an extended discussion with the patient reviewing all relevant studies completed to date and  lasting 15 to 20 minutes of a 25 minute visit    Each maintenance medication was reviewed in detail including most importantly the difference between maintenance and prns and under what circumstances the prns are to be triggered using an action plan format that is not reflected in the computer generated alphabetically organized AVS.    Please see AVS for unique instructions that I personally wrote and verbalized to the the pt in detail and then reviewed with pt  by my nurse highlighting any  changes in therapy recommended at today's visit to their plan of care.

## 2016-05-29 NOTE — Progress Notes (Signed)
Called the pt and msg states per subscriber's request, the pt does not accept incoming calls

## 2016-06-03 ENCOUNTER — Emergency Department (HOSPITAL_COMMUNITY): Payer: Self-pay

## 2016-06-03 ENCOUNTER — Emergency Department (HOSPITAL_COMMUNITY)
Admission: EM | Admit: 2016-06-03 | Discharge: 2016-06-03 | Disposition: A | Discharge status: Home / Self Care | Payer: Self-pay | Attending: Emergency Medicine | Admitting: Emergency Medicine

## 2016-06-03 ENCOUNTER — Encounter (HOSPITAL_COMMUNITY): Payer: Self-pay

## 2016-06-03 DIAGNOSIS — J441 Chronic obstructive pulmonary disease with (acute) exacerbation: Secondary | ICD-10-CM

## 2016-06-03 DIAGNOSIS — R0689 Other abnormalities of breathing: Secondary | ICD-10-CM

## 2016-06-03 DIAGNOSIS — I5042 Chronic combined systolic (congestive) and diastolic (congestive) heart failure: Secondary | ICD-10-CM | POA: Insufficient documentation

## 2016-06-03 DIAGNOSIS — Z7982 Long term (current) use of aspirin: Secondary | ICD-10-CM | POA: Insufficient documentation

## 2016-06-03 DIAGNOSIS — I2510 Atherosclerotic heart disease of native coronary artery without angina pectoris: Secondary | ICD-10-CM | POA: Insufficient documentation

## 2016-06-03 DIAGNOSIS — F17210 Nicotine dependence, cigarettes, uncomplicated: Secondary | ICD-10-CM | POA: Insufficient documentation

## 2016-06-03 DIAGNOSIS — I110 Hypertensive heart disease with heart failure: Secondary | ICD-10-CM | POA: Insufficient documentation

## 2016-06-03 LAB — BASIC METABOLIC PANEL
Anion gap: 9 (ref 5–15)
BUN: 17 mg/dL (ref 6–20)
CO2: 27 mmol/L (ref 22–32)
Calcium: 9.1 mg/dL (ref 8.9–10.3)
Chloride: 106 mmol/L (ref 101–111)
Creatinine, Ser: 1.13 mg/dL (ref 0.61–1.24)
GFR calc Af Amer: >60 mL/min (ref >60)
GFR calc non Af Amer: >60 mL/min (ref >60)
Glucose, Bld: 97 mg/dL (ref 65–99)
Potassium: 4.1 mmol/L (ref 3.5–5.1)
Sodium: 142 mmol/L (ref 135–145)

## 2016-06-03 LAB — CBC
HCT: 46.6 % (ref 39.0–52.0)
Hemoglobin: 15.4 g/dL (ref 13.0–17.0)
MCH: 30.7 pg (ref 26.0–34.0)
MCHC: 33 g/dL (ref 30.0–36.0)
MCV: 92.8 fL (ref 78.0–100.0)
Platelets: 207 10*3/uL (ref 150–400)
RBC: 5.02 MIL/uL (ref 4.22–5.81)
RDW: 14.1 % (ref 11.5–15.5)
WBC: 9.2 10*3/uL (ref 4.0–10.5)

## 2016-06-03 LAB — I-STAT TROPONIN, ED: Troponin i, poc: 0.04 ng/mL (ref 0.00–0.08)

## 2016-06-03 LAB — BRAIN NATRIURETIC PEPTIDE: B Natriuretic Peptide: 859.9 pg/mL — ABNORMAL HIGH (ref 0.0–100.0)

## 2016-06-03 MED ORDER — VANCOMYCIN HCL IN DEXTROSE 1-5 GM/200ML-% IV SOLN: 1000.0000 mg | Freq: Two times a day (BID) | INTRAVENOUS | Status: DC

## 2016-06-03 MED ORDER — DOXYCYCLINE HYCLATE 100 MG PO CAPS: 100.0000 mg | ORAL_CAPSULE | Freq: Two times a day (BID) | ORAL | 0 refills | Status: DC

## 2016-06-03 MED ORDER — ALBUTEROL SULFATE (2.5 MG/3ML) 0.083% IN NEBU
5.0000 mg | INHALATION_SOLUTION | Freq: Once | RESPIRATORY_TRACT | Status: AC
  Administered 2016-06-03: 5 mg via RESPIRATORY_TRACT

## 2016-06-03 MED ORDER — PREDNISONE 10 MG (21) PO TBPK: ORAL_TABLET | ORAL | 0 refills | Status: DC

## 2016-06-03 MED ORDER — METHYLPREDNISOLONE SODIUM SUCC 125 MG IJ SOLR
125.0000 mg | Freq: Once | INTRAMUSCULAR | Status: AC
  Administered 2016-06-03: 125 mg via INTRAVENOUS
  Filled 2016-06-03: qty 2

## 2016-06-03 MED ORDER — DEXTROSE 5 % IV SOLN
1.0000 g | Freq: Once | INTRAVENOUS | Status: AC
  Administered 2016-06-03: 1 g via INTRAVENOUS
  Filled 2016-06-03: qty 1

## 2016-06-03 MED ORDER — SODIUM CHLORIDE 0.9 % IV SOLN
1500.0000 mg | Freq: Once | INTRAVENOUS | Status: DC
  Filled 2016-06-03: qty 1500

## 2016-06-03 MED ORDER — IPRATROPIUM-ALBUTEROL 0.5-2.5 (3) MG/3ML IN SOLN
3.0000 mL | Freq: Once | RESPIRATORY_TRACT | Status: AC
  Administered 2016-06-03: 3 mL via RESPIRATORY_TRACT
  Filled 2016-06-03: qty 3

## 2016-06-03 MED ORDER — AEROCHAMBER PLUS FLO-VU MEDIUM MISC
1.0000 | Freq: Once | Status: AC
  Administered 2016-06-03: 1
  Filled 2016-06-03: qty 1

## 2016-06-03 MED ORDER — ALBUTEROL SULFATE (2.5 MG/3ML) 0.083% IN NEBU
INHALATION_SOLUTION | RESPIRATORY_TRACT | Status: AC
  Filled 2016-06-03: qty 6

## 2016-06-03 NOTE — Progress Notes (Signed)
Pharmacy Antibiotic Note  Jared [name] is a 60 y.o. male admitted on 06/03/2016 with pneumonia.  Pharmacy has been consulted for vancomycin dosing.  With increasing SOB and cough x2 days. Follows with Dr. 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 Discharged at end of November and unclear if he ever completed prescribed antibiotic course at that time.    Cefepime 1g IV x1 ordered in the ED.  Plan: Vancomycin 1500mg  IV x1, then 1000mg  IV every 12 hours.  Goal trough 15-20 mcg/mL. Follow up for continued gram negative coverage     Temp (24hrs), Avg:98.6 F (37 C), Min:98.6 F (37 C), Max:98.6 F (37 C)   Recent Labs Lab 06/03/16 1048  WBC 9.2  CREATININE 1.13    Estimated Creatinine Clearance: 81 mL/min (by C-G formula based on SCr of 1.13 mg/dL).    Allergies  Allergen Reactions  . Lisinopril Cough    Antimicrobials this admission: Vancomycin 12/24 >>   Dose adjustments this admission: N/a  Microbiology results: Nothing ordered at this time  Thank you for allowing pharmacy to be a part of this patient's care.  Jared [name] D. Apolo Cutshaw, PharmD, BCPS Clinical Pharmacist Pager: [phone] 06/03/2016 1:02 PM

## 2016-06-03 NOTE — ED Triage Notes (Signed)
Patient complains of increasing shortness lf breath and cough x 2 days. Saw dr. [name] this week and told he has tumor in lung with ongoing COPD exacerbation. No CP , mild wheezing noted with cough

## 2016-06-03 NOTE — ED Notes (Signed)
Meal tray at bedside.  

## 2016-06-03 NOTE — ED Provider Notes (Signed)
[facility]EMERGENC[facility] DEPT Provider Note   CSN: [id number] Arrival date & time: 06/03/16  1034     History   Chief Complaint Chief Complaint  Patient presents with  . Shortness of Breath    HPI Jared [name] is a 60 y.o. male.  HPI Jared [name] is a 60 y.o. male with history of coronary artery disease, COPD, CHF, prior pneumonia, presents to emergency department complaining of cough and shortness of breath. Patient was hospitalized and was discharged 20 days ago for pneumonia. Patient initially evaluated on 02/20/16 in ER, diagnosed with a right lower lobe pneumonia by CTA chest. He was treated with Levaquin at that time. Patient's symptoms seem to not improve all the way, and he developed shortness of breath and hemoptysis which was the reason for his recent admission. During her hospitalization, patient had bronchoscopy which resulted with atypical pneumonitis proliferation. He was treated with steroids and antibiotics. Sounds like he was discharged with antibiotics, but on the next follow-up visit it was noted that he never filled his antibiotics due to cost. I asked the patient if he ever took antibiotics, and he told me "I don't know, maybe." He then told me that he did get them filled and taken them. It is possible the patient never finished his antibiotics course. Patient reported to me that when he saw Dr. 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 I reviewed Dr. Thurston Hole note and I do not see any mentioning of that. At this time, patient is here because his cough is getting worse, he is reporting right-sided lower chest pain, he is having shortness of breath that is worse on exertion. He has been using his inhalers at home with no relief. Denies fever, chills, malaise.   Past Medical History:  Diagnosis Date  . CAD (coronary artery disease)    a. LHC 5/12:   LAD 20, pLCx 20, pRCA 40, dRCA 40, EF 35%, diff HK  //  b. Myoview 4/16: Overall Impression: High risk stress nuclear study There is no evidence of ischemia. There is severe LV dysfunction. LV Ejection Fraction: 30%. LV Wall Motion: There is global LV hypokinesis.   Marland Kitchen CAP (community acquired pneumonia) 09/2013  . Chronic combined systolic and diastolic CHF (congestive heart failure) (HCC)    a. Echo 4/16:Mild LVH, EF 40-45%, diffuse HK //  b. Echo 8/17: EF 35-40%, diffuse HK, diastolic dysfunction, aortic sclerosis, trivial MR, moderate LAE, normal RVSF, moderate RAE, mild TR, PASP 42 mmHg  . Cluster headache    "hx; haven't had one in awhile" (01/09/2016)  . COPD (chronic obstructive pulmonary disease) (HCC)    [name] 01/09/2016  . History of CVA (cerebrovascular accident)   . Hypertension   . NICM (nonischemic cardiomyopathy) (HCC)   . Tobacco abuse     Patient Active Problem List   Diagnosis Date Noted  . Prediabetes 05/14/2016  . Hemoptysis 05/06/2016  . COPD GOLD III/still smoking  02/29/2016  . Chronic systolic CHF (congestive heart failure) (HCC)   . Cigarette smoker 01/09/2016  . CAD (coronary artery disease), native coronary artery 10/07/2013  . 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 COPD (chronic obstructive pulmonary disease) (HCC) 10/04/2013  . Pulmonary infiltrate in right lung on CXR 10/03/2013  . Hypertension     Past Surgical History:  Procedure Laterality Date  .  CARDIAC CATHETERIZATION  10/2010   LM normal, LAD with 20% irregularities, LCX with 20%, RCA with 40% prox and 40% distal - EF of 35%  . COLONOSCOPY W/ BIOPSIES AND POLYPECTOMY    . VIDEO BRONCHOSCOPY Bilateral 05/08/2016   Procedure: VIDEO BRONCHOSCOPY WITH FLUORO;  Surgeon: Oretha Milch, MD;  Location: [facility] ENDOSCOPY;  Service: Cardiopulmonary;  Laterality: Bilateral;       Home Medications    Prior to Admission medications   Medication Sig Start Date End Date Taking? Authorizing  Provider  albuterol (PROVENTIL HFA;VENTOLIN HFA) 108 (90 Base) MCG/ACT inhaler Inhale 2 puffs into the lungs every 6 (six) hours as needed for wheezing or shortness of breath. 02/03/16   Jaclyn Shaggy, MD  aspirin 81 MG EC tablet Take 1 tablet (81 mg total) by mouth daily. 01/16/16   Tiffany Netta Cedars, PA-C  budesonide-formoterol (SYMBICORT) 160-4.5 MCG/ACT inhaler Inhale 2 puffs into the lungs 2 (two) times daily. 05/09/16   Osvaldo Shipper, MD  carvedilol (COREG) 6.25 MG tablet Take 6.25 mg by mouth 2 (two) times daily with a meal.    Historical Provider, MD  furosemide (LASIX) 40 MG tablet Take 1 tablet (40 mg total) by mouth daily. 02/24/16   Beatrice Lecher, PA-C  ibuprofen (ADVIL,MOTRIN) 600 MG tablet Take 600 mg by mouth every 6 (six) hours as needed for headache.    Historical Provider, MD  potassium chloride SA (K-DUR,KLOR-CON) 20 MEQ tablet Take 2 tablets (40 mEq total) by mouth daily. 05/09/16   Osvaldo Shipper, MD    Family History Family History  Problem Relation Age of Onset  . Cancer Mother   . Heart disease Mother   . Diabetes Mother   . Cancer Father   . Diabetes Father   . Diabetes Sister   . Diabetes Brother     Social History Social History  Substance Use Topics  . Smoking status: Current Every Day Smoker    Packs/day: 1.00    Years: 42.00    Types: Cigarettes  . Smokeless tobacco: Never Used  . Alcohol use 3.6 oz/week    6 Glasses of wine per week     Comment: weekends     Allergies   Lisinopril   Review of Systems Review of Systems  Constitutional: Negative for chills and fever.  Respiratory: Positive for cough, chest tightness and shortness of breath.   Cardiovascular: Positive for chest pain. Negative for palpitations and leg swelling.  Gastrointestinal: Negative for abdominal distention, abdominal pain, diarrhea, nausea and vomiting.  Genitourinary: Negative for dysuria, frequency, hematuria and urgency.  Musculoskeletal: Negative for arthralgias,  myalgias, neck pain and neck stiffness.  Skin: Negative for rash.  Allergic/Immunologic: Negative for immunocompromised state.  Neurological: Negative for dizziness, weakness, light-headedness, numbness and headaches.  All other systems reviewed and are negative.    Physical Exam Updated Vital Signs BP (!) 177/102   Pulse 101   Temp 98.6 F (37 C) (Oral)   Resp 24   SpO2 100%   Physical Exam  Constitutional: He appears well-developed and well-nourished. No distress.  HENT:  Head: Normocephalic and atraumatic.  Eyes: Conjunctivae are normal.  Neck: Neck supple.  Cardiovascular: Normal rate, regular rhythm and normal heart sounds.   Pulmonary/Chest: He has no wheezes. He has no rales.  Tachypneic  Abdominal: Soft. Bowel sounds are normal. He exhibits no distension. There is no tenderness. There is no rebound.  Musculoskeletal: He exhibits no edema.  Neurological: He is alert.  Skin: Skin is warm  and dry.  Nursing note and vitals reviewed.    ED Treatments / Results  Labs (all labs ordered are listed, but only abnormal results are displayed) Labs Reviewed  BRAIN NATRIURETIC PEPTIDE - Abnormal; Notable for the following:       Result Value   B Natriuretic Peptide 859.9 (*)    All other components within normal limits  BASIC METABOLIC PANEL  CBC  I-STAT TROPOININ, ED    EKG  EKG Interpretation None       Radiology Dg Chest 2 View  Result Date: 06/03/2016 CLINICAL DATA:  Patient complains of increasing shortness of breath and cough x 2 days. Saw dr. Sherene SiresWert this week and told he has tumor in lung with ongoing COPD exacerbation. C/o chest pain on right side of chest that has been ongoing with increas.*comment was truncated* EXAM: CHEST  2 VIEW COMPARISON:  05/28/2016 and CT of 02/20/2016. FINDINGS: Hyperinflation. Midline trachea. Mild cardiomegaly. Atherosclerosis in the transverse aorta. No pleural effusion or pneumothorax. Diffuse peribronchial thickening. Residual  or recurrent right lower lobe airspace disease is patchy and primarily identified laterally. This is felt to be increased compared to 05/28/16, but decreased since 02/20/2016. IMPRESSION: Residual or recurrent right lower lobe pneumonia, increased since 05/28/2016. Underlying COPD/ chronic bronchitis. Electronically Signed   By: Jeronimo GreavesKyle  Talbot M.D.   On: 06/03/2016 11:22    Procedures Procedures (including critical care time)  Medications Ordered in ED Medications  albuterol (PROVENTIL) (2.5 MG/3ML) 0.083% nebulizer solution 5 mg (5 mg Nebulization Given 06/03/16 1046)  ipratropium-albuterol (DUONEB) 0.5-2.5 (3) MG/3ML nebulizer solution 3 mL (3 mLs Nebulization Given 06/03/16 1208)  ceFEPIme (MAXIPIME) 1 g in dextrose 5 % 50 mL IVPB (0 g Intravenous Stopped 06/03/16 1536)  methylPREDNISolone sodium succinate (SOLU-MEDROL) 125 mg/2 mL injection 125 mg (125 mg Intravenous Given 06/03/16 1349)  ipratropium-albuterol (DUONEB) 0.5-2.5 (3) MG/3ML nebulizer solution 3 mL (3 mLs Nebulization Given 06/03/16 1341)  AEROCHAMBER PLUS FLO-VU MEDIUM MISC 1 each (1 each Other Given 06/03/16 1533)     Initial Impression / Assessment and Plan / ED Course  I have reviewed the triage vital signs and the nursing notes.  Pertinent labs & imaging results that were available during my care of the patient were reviewed by me and considered in my medical decision making (see chart for details).  Clinical Course    Patient in emergency department with cough and shortness of breath. This is a recurrent issue. Recent hospitalization for the same, diagnosed with pneumonia. Patient denies fever, he does have productive cough, but no hemoptysis. Will check labs, trop, CXR.  1:20 PM Chest x-ray showed a recurrent pneumonia in the right lower lobe. Worse from recent x-ray on 05/28/16 when he was seen by Dr. Sherene SiresWert in the office. Patient's labs are unremarkable. He was able to ambulate in the hallway without desaturating. He  did however get very short of breath. He is tachypneic on the reexamination. He does not appear to be fluid overloaded. We'll continue breathing treatments, question some COPD. Will call for admission for pneumonia treatment.  2:47 PM I discussed patient with Dr. Melynda RippleHobbs with [facility], who has seen the patient as well. He discussed patient with pulmonary. Most likely exacerbation of COPD. Recurrent pneumonia potentially a tumor. Advised to treat his COPD exacerbation, home with doxycycline, steroids, continue inhaler. Will provide with a spacer. Plan to follow-up with Dr. Sherene SiresWert in the office. Pt was able to ambulate without drop of O2.   Vitals:   06/03/16  1415 06/03/16 1445 06/03/16 1500 06/03/16 1515  BP: 180/96 170/94 167/96 169/98  Pulse: 102 105 102 96  Resp: 24 25  22   Temp:      TempSrc:      SpO2: 94% 91% 93% 93%     Final Clinical Impressions(s) / ED Diagnoses   Final diagnoses:  COPD exacerbation (HCC)    New Prescriptions Discharge Medication List as of 06/03/2016  3:26 PM    START taking these medications   Details  doxycycline (VIBRAMYCIN) 100 MG capsule Take 1 capsule (100 mg total) by mouth 2 (two) times daily., Starting Sun 06/03/2016, Print    predniSONE (STERAPRED UNI-PAK 21 TAB) 10 MG (21) TBPK tablet Take 6 tabs by mouth daily  for 2 days, then 5 tabs for 2 days, then 4 tabs for 2 days, then 3 tabs for 2 days, 2 tabs for 2 days, then 1 tab by mouth daily for 2 days, Print         Jaynie Crumble, PA-C 06/03/16 1545    Linwood Dibbles, MD 06/03/16 [phone]

## 2016-06-03 NOTE — ED Notes (Signed)
Dr. Hobbs at bedside  

## 2016-06-03 NOTE — Consult Note (Signed)
[facility] History and Physical  Barnett Elzey QMV:[id number] DOB: April 09, 1956 DOA: 06/03/2016  Referring physician:  PCP: Jaclyn Shaggy, MD   Chief Complaint: "I couldn't breathe."  HPI: Jared [name] is a 60 y.o. male  with past medical history significant for coronary artery disease, heart failure, COPD, tobacco abuse, abnormality on chest x-ray presents emergency room with shortness breath. Patient states he has a long history of shortness of breath but was worse today. Patient states symptoms started all of a sudden. Denies any trauma. Denies sick contacts. Denies upper respiratory tract infection symptoms. Patient states he has a flu vaccine. Patient states he just woke up and just had trouble breathing. Patient states normally a bit short of breath he walks a lot but now is having typically getting around the room. Patient states he does not require ever having a COPD exacerbation before. Patient has a metered-dose inhaler but does not use a spacer. \\  ED course: Patient was given a dose of IV steroids and 2 DuoNeb's. Patient was ambulated around the emergency room and his sats at 91%.   Review of Systems:  As per HPI otherwise 10 point review of systems negative.    Past Medical History:  Diagnosis Date  . CAD (coronary artery disease)    a. LHC 5/12:  LAD 20, pLCx 20, pRCA 40, dRCA 40, EF 35%, diff HK  //  b. Myoview 4/16: Overall Impression: High risk stress nuclear study There is no evidence of ischemia. There is severe LV dysfunction. LV Ejection Fraction: 30%. LV Wall Motion: There is global LV hypokinesis.   Marland Kitchen CAP (community acquired pneumonia) 09/2013  . Chronic combined systolic and diastolic CHF (congestive heart failure) (HCC)    a. Echo 4/16:Mild LVH, EF 40-45%, diffuse HK //  b. Echo 8/17: EF 35-40%, diffuse HK, diastolic dysfunction, aortic sclerosis, trivial MR, moderate LAE, normal RVSF, moderate RAE, mild TR, PASP 42 mmHg  . Cluster headache    "hx;  haven't had one in awhile" (01/09/2016)  . COPD (chronic obstructive pulmonary disease) (HCC)    [name] 01/09/2016  . History of CVA (cerebrovascular accident)   . Hypertension   . NICM (nonischemic cardiomyopathy) (HCC)   . Tobacco abuse    Past Surgical History:  Procedure Laterality Date  . CARDIAC CATHETERIZATION  10/2010   LM normal, LAD with 20% irregularities, LCX with 20%, RCA with 40% prox and 40% distal - EF of 35%  . COLONOSCOPY W/ BIOPSIES AND POLYPECTOMY    . VIDEO BRONCHOSCOPY Bilateral 05/08/2016   Procedure: VIDEO BRONCHOSCOPY WITH FLUORO;  Surgeon: Oretha Milch, MD;  Location: [facility] ENDOSCOPY;  Service: Cardiopulmonary;  Laterality: Bilateral;   Social History:  reports that he has been smoking Cigarettes.  He has a 42.00 pack-year smoking history. He has never used smokeless tobacco. He reports that he drinks about 3.6 oz of alcohol per week . He reports that he does not use drugs.  Allergies  Allergen Reactions  . Lisinopril Cough    Family History  Problem Relation Age of Onset  . Cancer Mother   . Heart disease Mother   . Diabetes Mother   . Cancer Father   . Diabetes Father   . Diabetes Sister   . Diabetes Brother      Prior to Admission medications   Medication Sig Start Date End Date Taking? Authorizing Provider  albuterol (PROVENTIL HFA;VENTOLIN HFA) 108 (90 Base) MCG/ACT inhaler Inhale 2 puffs into the lungs every 6 (six) hours as needed  for wheezing or shortness of breath. 02/03/16  Yes Jaclyn ShaggyEnobong Amao, MD  budesonide-formoterol (SYMBICORT) 160-4.5 MCG/ACT inhaler Inhale 2 puffs into the lungs 2 (two) times daily. 05/09/16  Yes Osvaldo ShipperGokul Krishnan, MD  ibuprofen (ADVIL,MOTRIN) 600 MG tablet Take 600 mg by mouth every 6 (six) hours as needed for headache.   Yes Historical Provider, MD  potassium chloride SA (K-DUR,KLOR-CON) 20 MEQ tablet Take 2 tablets (40 mEq total) by mouth daily. Patient taking differently: Take 20 mEq by mouth daily.  05/09/16  Yes Osvaldo ShipperGokul  Krishnan, MD  aspirin 81 MG EC tablet Take 1 tablet (81 mg total) by mouth daily. Patient not taking: Reported on 06/03/2016 01/16/16   Vivianne Masteriffany S Noel, PA-C  carvedilol (COREG) 6.25 MG tablet Take 6.25 mg by mouth 2 (two) times daily with a meal.    Historical Provider, MD  furosemide (LASIX) 40 MG tablet Take 1 tablet (40 mg total) by mouth daily. Patient not taking: Reported on 06/03/2016 02/24/16   Beatrice LecherScott T Weaver, PA-C   Physical Exam: Vitals:   06/03/16 1245 06/03/16 1300 06/03/16 1315 06/03/16 1330  BP: 170/96 (!) 174/103 (!) 177/105 (!) 164/112  Pulse: 101 101 95 98  Resp: 22 19 21 22   Temp:      TempSrc:      SpO2: 95% 94% 95% 95%    Wt Readings from Last 3 Encounters:  05/28/16 87.8 kg (193 lb 9.6 oz)  05/14/16 86.5 kg (190 lb 9.6 oz)  05/09/16 84.7 kg (186 lb 11.4 oz)    General:  Appears calm and comfortable, Alert and oriented 3 Eyes:  PERRL, EOMI, normal lids, iris ENT:  grossly normal hearing, lips & tongue Neck:  no LAD, masses or thyromegaly Cardiovascular:  RRR, no m/r/g. No LE edema.  Respiratory:  Diminished breath sounds throughout, nl WOB, no wheezes rales or rhonchi Abdomen:  soft, ntnd Skin:  no rash or induration seen on limited exam Musculoskeletal:  grossly normal tone BUE/BLE Psychiatric:  grossly normal mood and affect, speech fluent and appropriate Neurologic:  CN 2-12 grossly intact, moves all extremities in coordinated fashion.          Labs on Admission:  Basic Metabolic Panel:  Recent Labs Lab 06/03/16 1048  NA 142  K 4.1  CL 106  CO2 27  GLUCOSE 97  BUN 17  CREATININE 1.13  CALCIUM 9.1   Liver Function Tests: No results for input(s): AST, ALT, ALKPHOS, BILITOT, PROT, ALBUMIN in the last 168 hours. No results for input(s): LIPASE, AMYLASE in the last 168 hours. No results for input(s): AMMONIA in the last 168 hours. CBC:  Recent Labs Lab 06/03/16 1048  WBC 9.2  HGB 15.4  HCT 46.6  MCV 92.8  PLT 207   Cardiac  Enzymes: No results for input(s): CKTOTAL, CKMB, CKMBINDEX, TROPONINI in the last 168 hours.  BNP (last 3 results)  Recent Labs  02/10/16 0845 02/20/16 0654 06/03/16 1048  BNP 580.5* 867.6* 859.9*    ProBNP (last 3 results) No results for input(s): PROBNP in the last 8760 hours.   Serum creatinine: 1.13 mg/dL 09/81/1912/24/17 14781048 Estimated creatinine clearance: 81 mL/min  CBG: No results for input(s): GLUCAP in the last 168 hours.  Radiological Exams on Admission: Dg Chest 2 View  Result Date: 06/03/2016 CLINICAL DATA:  Patient complains of increasing shortness of breath and cough x 2 days. Saw dr. Sherene SiresWert this week and told he has tumor in lung with ongoing COPD exacerbation. C/o chest pain on right side of  chest that has been ongoing with increas.*comment was truncated* EXAM: CHEST  2 VIEW COMPARISON:  05/28/2016 and CT of 02/20/2016. FINDINGS: Hyperinflation. Midline trachea. Mild cardiomegaly. Atherosclerosis in the transverse aorta. No pleural effusion or pneumothorax. Diffuse peribronchial thickening. Residual or recurrent right lower lobe airspace disease is patchy and primarily identified laterally. This is felt to be increased compared to 05/28/16, but decreased since 02/20/2016. IMPRESSION: Residual or recurrent right lower lobe pneumonia, increased since 05/28/2016. Underlying COPD/ chronic bronchitis. Electronically Signed   By: Jeronimo Greaves M.D.   On: 06/03/2016 11:22    EKG: Independently reviewed. Ventricular rate 101, PR interval 128, QRS 96, QTC 464; sinus rhythm-no acute changes when compared to November 2017 EKG   Assessment/Plan Active Problems:   COPD exacerbation (HCC)  Patient on my exam is doing well. Admits to continued smoking. Discussed case with emergency medicine provider. Agreed to send patient home on COPD exacerbation medications. Patient will get spacer teaching in the emergency room. Patient should use his inhaler every 6 hours for the first 3 days. He can  use the inhaler every 2 hours in between as necessary for shortness of breath. Doxycycline 100 mg twice a day for the next 10 days. 5-7 day steroid taper.  Discussed x-ray findings with patient's pulmonologist Dr. [name]. Patient needs to call for follow-up visit in the next 2-3 weeks.   Haydee Salter, MD Family Medicine [facility] [email or web address] Password [email or web address]

## 2016-06-03 NOTE — Discharge Instructions (Signed)
Use inhaler 2 puffs every 4 hours, use spacer with the inhaler. Take doxycycline as prescribed until all gone for infection. Take prednisone as prescribed until all gone. Please follow-up with Dr. [name].

## 2016-06-06 ENCOUNTER — Encounter: Payer: Self-pay | Admitting: *Deleted

## 2016-06-06 ENCOUNTER — Telehealth: Payer: Self-pay | Admitting: *Deleted

## 2016-06-06 NOTE — Telephone Encounter (Signed)
-----   Message from Nyoka Cowden, MD sent at 06/04/2016  5:55 AM EST ----- Move up f/u to 4 weeks with ct chest first (about Jan 18)

## 2016-06-06 NOTE — Telephone Encounter (Signed)
Called pt- msg states the subscriber does not receive incoming calls  Will have to mail him a letter  Letter mailed

## 2016-06-08 ENCOUNTER — Telehealth: Payer: Self-pay | Admitting: Internal Medicine

## 2016-06-08 DIAGNOSIS — R918 Other nonspecific abnormal finding of lung field: Secondary | ICD-10-CM

## 2016-06-08 NOTE — Telephone Encounter (Signed)
Spoke with the pt and advised that after reviewing all of his records at last ov, MW decided to have him come back in 4 wks and have ct chest first. He verbalized understanding and nothing further needed. Order sent to [facility].

## 2016-06-12 LAB — FUNGAL ORGANISM REFLEX

## 2016-06-12 LAB — FUNGUS CULTURE WITH STAIN

## 2016-06-12 LAB — FUNGUS CULTURE RESULT

## 2016-06-21 LAB — ACID FAST CULTURE WITH REFLEXED SENSITIVITIES (MYCOBACTERIA): Acid Fast Culture: NEGATIVE

## 2016-06-26 ENCOUNTER — Encounter: Payer: Self-pay | Admitting: Internal Medicine

## 2016-06-26 ENCOUNTER — Ambulatory Visit (INDEPENDENT_AMBULATORY_CARE_PROVIDER_SITE_OTHER): Payer: Self-pay | Admitting: Internal Medicine

## 2016-06-26 ENCOUNTER — Ambulatory Visit (INDEPENDENT_AMBULATORY_CARE_PROVIDER_SITE_OTHER)
Admission: RE | Admit: 2016-06-26 | Discharge: 2016-06-26 | Disposition: A | Discharge status: Home / Self Care | Payer: Self-pay | Source: Ambulatory Visit | Attending: Internal Medicine | Admitting: Internal Medicine

## 2016-06-26 VITALS — BP 132/76 | HR 65 | Ht 75.0 in | Wt 213.0 lb

## 2016-06-26 DIAGNOSIS — I5042 Chronic combined systolic (congestive) and diastolic (congestive) heart failure: Secondary | ICD-10-CM

## 2016-06-26 DIAGNOSIS — I10 Essential (primary) hypertension: Secondary | ICD-10-CM

## 2016-06-26 DIAGNOSIS — J441 Chronic obstructive pulmonary disease with (acute) exacerbation: Secondary | ICD-10-CM

## 2016-06-26 DIAGNOSIS — R918 Other nonspecific abnormal finding of lung field: Secondary | ICD-10-CM

## 2016-06-26 DIAGNOSIS — J449 Chronic obstructive pulmonary disease, unspecified: Secondary | ICD-10-CM

## 2016-06-26 DIAGNOSIS — F17210 Nicotine dependence, cigarettes, uncomplicated: Secondary | ICD-10-CM

## 2016-06-26 MED ORDER — BISOPROLOL FUMARATE 5 MG PO TABS: 5.0000 mg | ORAL_TABLET | Freq: Every day | ORAL | 11 refills | Status: DC

## 2016-06-26 MED ORDER — GLYCOPYRROLATE-FORMOTEROL 9-4.8 MCG/ACT IN AERO: 2.0000 | INHALATION_SPRAY | Freq: Two times a day (BID) | RESPIRATORY_TRACT | 0 refills | Status: AC

## 2016-06-26 MED ORDER — ALBUTEROL SULFATE HFA 108 (90 BASE) MCG/ACT IN AERS: 2.0000 | INHALATION_SPRAY | Freq: Four times a day (QID) | RESPIRATORY_TRACT | 11 refills | Status: DC | PRN

## 2016-06-26 MED ORDER — POTASSIUM CHLORIDE CRYS ER 20 MEQ PO TBCR: 40.0000 meq | EXTENDED_RELEASE_TABLET | Freq: Every day | ORAL | 0 refills | Status: DC

## 2016-06-26 MED FILL — ?POTASSIUM CL ER 20 MEQ TAB: 20 | 30 days supply | Qty: 60 | Fill #0

## 2016-06-26 MED FILL — BISOPROLOL FUMARATE 5 MG TA: 5 | 30 days supply | Qty: 30 | Fill #0

## 2016-06-26 MED FILL — VENTOLIN HFA 90 MCG INHALER: 108 (90 BAS | 20 days supply | Qty: 18 | Fill #0

## 2016-06-26 NOTE — Patient Instructions (Addendum)
Bisoprolol 5 mg one daily in place of the twice daily corevidol and resume your lasix and potassium    Plan A = Automatic = stop symbicort and start bevespi Take 2 puffs first thing in am and then another 2 puffs about 12 hours later.   Plan B = Backup Only use your albuterol as a rescue medication to be used if you can't catch your breath by resting or doing a relaxed purse lip breathing pattern.  - The less you use it, the better it will work when you need it. - Ok to use the inhaler up to 2 puffs  every 4 hours if you must but call for appointment if use goes up over your usual need - Don't leave home without it !!  (think of it like the spare tire for your car)   The key is to stop smoking completely before smoking completely stops you!    Please schedule a follow up office visit in 4 weeks, sooner if needed bring all meds

## 2016-06-26 NOTE — Progress Notes (Signed)
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 He also reports dyspnea on exertion. In addition, he reports epigastric abdominal pain, which may radiate to the lower portion of his sternum versus chest pain . He denies any lower extremity edema. Admits to salt rich food ingestion .The symptoms are similar to those of one week ago, at which time, workup was suspicious for acute exacerbation of systolic heart failure. She was started on Cozaar, metoprolol and Lasix 20 mg daily at the time, and was recommended to see cardiology, which he failed to do so. His cardiologist is Dr. Dietrich Pates, last seen in December 2016.the patient continues to smoke 1 pack a day of cigarettes  Discharge Diagnoses:  Acute respiratory failure secondary to CHF exacerbation -O2 sats upon admission 89% -CXR unremarkable for infection -Responded well to IVlasix-->po lasix -Weaned off of O2-SpO2 95% on room air  Acute combined systolic/diastolic heart failure -BNP 2056.8 -Echocardiogram 09/20/2014 showed EF 40-45% -Transition to by mouth Lasix 20 mg daily -Echocardiogram 01/10/2016: EF 35-40%, diastolic dysfunction --AST 147, ALT 110 upon admission -Currently trending downward -hepatitis panel unremarkable -Patient denies drug/alcohol use -switch metoprolol-->coreg -continue  losartan  Essential Hypertension -Has not been taking home medications for over a year due to lack of insurance and monetary funds -Restarted on losartan, Lasix --switch metoprolol-->coreg 6.25 mg bid  CAD -Currently no chest pain -Continue coreg, losartan, ASA 81 mg daily -LDL 80  Tobacco Abuse -Smoking cessation discussed -Continue nicotine patch  Moderate malnutrition -Nutrition consult   History of Present Illness  02/29/2016 1st [facility] office visit/ Maire Govan  symb 160 2bid/ bid saba (not prn)  Chief Complaint  Patient presents with  . Pulmonary Consult    Pt referred by Tereso Newcomer, PA, for cough, hemoptysis, and emphysema. Pt c/o hemoptysis x 3 weeks, pt states this occurs daily but has been improving. Pt c/o increase in SOB with activity and rest.   acute  onset epistaxis/ hemoptyis end of Auguest > ER eval 02/20/16 dx RLL pna by CTa chest > rx levaquin/ prednisone and minimal hemoptysis now  Doe = MMRC2 = can't walk a nl pace on a flat grade s sob but does fine slow and flat eg [employer] shopping rec Plan A = Automatic =  Symbicort 160 Take 2 puffs first thing in am and then another 2 puffs about 12 hours later.  Work on inhaler technique: Plan B = Backup Only use your albuterol as a rescue medication\ Plan C = Crisis - only use your albuterol nebulizer if you first try Plan B and it fails to help > ok to use the nebulizer up to every 4 hours but if start needing it regularly call for immediate appointment  The key is to stop smoking completely before smoking completely stops you!  Please schedule a follow up office visit in 2 weeks, sooner if needed  with cxr on return > did not return   Admit date: 05/04/2016 Discharge date: 05/09/2016    DISCHARGE DIAGNOSES:  Principal Problem:   Hemoptysis Active Problems:   Community acquired pneumonia of right lower lobe of lung (HCC) - - FOB RLL tbbx ATYPICAL PNEUMOCYTES PROLIFERATION/ LLL BAL 05/08/16     Hypertension   COPD with acute exacerbation (HCC)   Nonischemic dilated cardiomyopathy (HCC)   Cigarette smoker   Chronic systolic CHF (congestive heart failure) (HCC)     05/28/2016  f/u ov/[facility] Soliman re: post hosp for ? pna / GOLD III copd / still smoking / maint rx symb 160 2bid /saba bid Chief Complaint  Patient presents with  . Hospitalization Follow-up    His breathing has improved. He still has cough and mucus is now clear. He is using rescue inhaler 2 x daily on average.    back to baseline doe = MMRC2 = can't walk a nl pace on a flat grade s sob but does fine slow and flat eg shopping is ok rec  Please schedule a follow up visit in 3 months but call sooner if needed with cxr on return  Late add :  Move up f/u to 4 weeks with ct chest first   06/03/16 to ER> only better while on neb    06/26/2016  f/u ov/Dawnyel Leven re:  GOLD III copd / maint symb 160 / saba  Chief Complaint  Patient presents with  . Follow-up    pt had CT today. pt states breathing has worsen since last OV. pt c/o increased sob with exertion, prod cough with white mucus, wheezing & chest tightness,  breathing was worse w/in 2 days of last ov / then ran out of lasix one week ago and swelling started to get worse Also worse orthopnea ? Related to leg swelling in terms of timing but pt not sure  Doe now = MMRC3 = can't walk 100 yards even at a slow pace at a flat grade s stopping due to sob    No obvious patterns in  day to day or daytime variability or assoc   purulent sputum or mucus plugs    cp or chest tightness, subjective wheeze or overt sinus or hb symptoms. No unusual exp hx or h/o childhood pna/ asthma or knowledge of premature birth.  Sleeping ok without nocturnal  or early am exacerbation  of respiratory  c/o's or need for noct saba. Also denies any obvious fluctuation of symptoms with weather or environmental changes or other aggravating or alleviating factors except as outlined above   Current Medications,  Allergies, Complete Past Medical History, Past Surgical History, Family History, and Social History were reviewed in [employer] record.  ROS  The following are not active complaints unless bolded sore throat, dysphagia, dental problems, itching, sneezing,  nasal congestion or excess/ purulent secretions, ear ache,   fever, chills, sweats, unintended wt loss, classically pleuritic or exertional cp,  orthopnea pnd or leg swelling, presyncope, palpitations, abdominal pain, anorexia, nausea, vomiting, diarrhea  or change in bowel or bladder habits, change in stools or urine, dysuria,hematuria,  rash, arthralgias, visual complaints, headache, numbness, weakness or ataxia or problems with walking or coordination,  change in mood/affect or memory.                 Objective:   Physical Exam  amb bm nad > stated age   06/26/2016        213  05/28/2016  194   02/29/16 177 lb 12.8 oz (80.6 kg)  02/24/16 182 lb 12.8 oz (82.9 kg)  02/20/16 175 lb (79.4 kg)    Vital signs reviewed - sats 97% on RA on arrival    HEENT: nl   oropharynx. Nl external ear canals without cough reflex -  Poor dentition/ moderate bilateral non-specific turbinate edema     NECK :  without JVD/Nodes/TM/ nl carotid upstrokes bilaterally   LUNGS: no acc muscle use,  slt barrel  contour chest  With distant bs/ very min bilateral exp rhonchi   CV:  RRR  no s3 or murmur or increase in P2, 1-2+ sym bilateral lower ext edema   ABD:  soft and nontender with end inspiratory hoover's in the supine position. No bruits or organomegaly, bowel sounds nl  MS:  Nl gait/ ext warm without deformities, calf tenderness, cyanosis or clubbing No obvious joint restrictions   SKIN: warm and dry without lesions    NEURO:  alert, approp, nl sensorium with  no motor deficits                 Assessment & Plan:

## 2016-06-28 ENCOUNTER — Encounter (HOSPITAL_COMMUNITY): Payer: Self-pay | Admitting: Emergency Medicine

## 2016-06-28 ENCOUNTER — Emergency Department (HOSPITAL_COMMUNITY): Payer: Medicaid Other

## 2016-06-28 ENCOUNTER — Emergency Department (HOSPITAL_COMMUNITY)
Admission: EM | Admit: 2016-06-28 | Discharge: 2016-06-29 | Disposition: A | Discharge status: Home / Self Care | Payer: Medicaid Other | Attending: Emergency Medicine | Admitting: Emergency Medicine

## 2016-06-28 DIAGNOSIS — Z8673 Personal history of transient ischemic attack (TIA), and cerebral infarction without residual deficits: Secondary | ICD-10-CM | POA: Insufficient documentation

## 2016-06-28 DIAGNOSIS — K7469 Other cirrhosis of liver: Secondary | ICD-10-CM

## 2016-06-28 DIAGNOSIS — I5043 Acute on chronic combined systolic (congestive) and diastolic (congestive) heart failure: Secondary | ICD-10-CM | POA: Insufficient documentation

## 2016-06-28 DIAGNOSIS — I2510 Atherosclerotic heart disease of native coronary artery without angina pectoris: Secondary | ICD-10-CM | POA: Insufficient documentation

## 2016-06-28 DIAGNOSIS — I5042 Chronic combined systolic (congestive) and diastolic (congestive) heart failure: Secondary | ICD-10-CM

## 2016-06-28 DIAGNOSIS — R1084 Generalized abdominal pain: Secondary | ICD-10-CM

## 2016-06-28 DIAGNOSIS — I509 Heart failure, unspecified: Secondary | ICD-10-CM

## 2016-06-28 DIAGNOSIS — Z79899 Other long term (current) drug therapy: Secondary | ICD-10-CM | POA: Insufficient documentation

## 2016-06-28 DIAGNOSIS — J449 Chronic obstructive pulmonary disease, unspecified: Secondary | ICD-10-CM | POA: Insufficient documentation

## 2016-06-28 DIAGNOSIS — F17210 Nicotine dependence, cigarettes, uncomplicated: Secondary | ICD-10-CM | POA: Insufficient documentation

## 2016-06-28 DIAGNOSIS — I110 Hypertensive heart disease with heart failure: Secondary | ICD-10-CM | POA: Insufficient documentation

## 2016-06-28 LAB — URINALYSIS, ROUTINE W REFLEX MICROSCOPIC
Bacteria, UA: NONE SEEN
Bilirubin Urine: NEGATIVE
Glucose, UA: NEGATIVE mg/dL
Hgb urine dipstick: NEGATIVE
Ketones, ur: 5 mg/dL — AB
Leukocytes, UA: NEGATIVE
Nitrite: NEGATIVE
Protein, ur: 100 mg/dL — AB
Specific Gravity, Urine: 1.026 (ref 1.005–1.030)
pH: 5 (ref 5.0–8.0)

## 2016-06-28 LAB — HEPATIC FUNCTION PANEL
ALT: 118 U/L — ABNORMAL HIGH (ref 17–63)
AST: 144 U/L — ABNORMAL HIGH (ref 15–41)
Albumin: 3.4 g/dL — ABNORMAL LOW (ref 3.5–5.0)
Alkaline Phosphatase: 199 U/L — ABNORMAL HIGH (ref 38–126)
Bilirubin, Direct: 0.4 mg/dL (ref 0.1–0.5)
Indirect Bilirubin: 1 mg/dL — ABNORMAL HIGH (ref 0.3–0.9)
Total Bilirubin: 1.4 mg/dL — ABNORMAL HIGH (ref 0.3–1.2)
Total Protein: 5.8 g/dL — ABNORMAL LOW (ref 6.5–8.1)

## 2016-06-28 LAB — I-STAT TROPONIN, ED: Troponin i, poc: 0.04 ng/mL (ref 0.00–0.08)

## 2016-06-28 LAB — CBC
HCT: 46.8 % (ref 39.0–52.0)
Hemoglobin: 14.7 g/dL (ref 13.0–17.0)
MCH: 30.4 pg (ref 26.0–34.0)
MCHC: 31.4 g/dL (ref 30.0–36.0)
MCV: 96.9 fL (ref 78.0–100.0)
Platelets: 191 10*3/uL (ref 150–400)
RBC: 4.83 MIL/uL (ref 4.22–5.81)
RDW: 15.4 % (ref 11.5–15.5)
WBC: 6.5 10*3/uL (ref 4.0–10.5)

## 2016-06-28 LAB — BASIC METABOLIC PANEL
Anion gap: 7 (ref 5–15)
BUN: 27 mg/dL — ABNORMAL HIGH (ref 6–20)
CO2: 28 mmol/L (ref 22–32)
Calcium: 9 mg/dL (ref 8.9–10.3)
Chloride: 108 mmol/L (ref 101–111)
Creatinine, Ser: 1.25 mg/dL — ABNORMAL HIGH (ref 0.61–1.24)
GFR calc Af Amer: >60 mL/min (ref >60)
GFR calc non Af Amer: >60 mL/min (ref >60)
Glucose, Bld: 121 mg/dL — ABNORMAL HIGH (ref 65–99)
Potassium: 4.9 mmol/L (ref 3.5–5.1)
Sodium: 143 mmol/L (ref 135–145)

## 2016-06-28 MED ORDER — IPRATROPIUM-ALBUTEROL 0.5-2.5 (3) MG/3ML IN SOLN
RESPIRATORY_TRACT | Status: AC
  Administered 2016-06-28: 3 mL via RESPIRATORY_TRACT
  Filled 2016-06-28: qty 3

## 2016-06-28 MED ORDER — FUROSEMIDE 20 MG PO TABS: 20.0000 mg | ORAL_TABLET | Freq: Every day | ORAL | 0 refills | Status: DC

## 2016-06-28 MED ORDER — FUROSEMIDE 10 MG/ML IJ SOLN
40.0000 mg | INTRAMUSCULAR | Status: AC
  Administered 2016-06-28: 40 mg via INTRAVENOUS
  Filled 2016-06-28: qty 4

## 2016-06-28 MED ORDER — IPRATROPIUM-ALBUTEROL 0.5-2.5 (3) MG/3ML IN SOLN
3.0000 mL | Freq: Once | RESPIRATORY_TRACT | Status: AC
  Administered 2016-06-28: 3 mL via RESPIRATORY_TRACT

## 2016-06-28 NOTE — ED Provider Notes (Signed)
[facility]-EMERGENCY DEPT Provider Note   CSN: [id number] Arrival date & time: 06/28/16  1653     History   Chief Complaint Chief Complaint  Patient presents with  . Abdominal Pain  . Shortness of Breath    HPI Jared [name] is a 61 y.o. male.  Jared [name] is a 61 y.o. Male with a history of CHF, COPD, and CAD who presents to the emergency room complaining of 3 days of increasing shortness of breath, chest pain, bilateral lower abdominal pain and lower extremity edema. Patient reports his symptoms began about 3 days ago when his Lasix was stopped.  Patient usually takes Lasix 40 mg. He tells me he saw his pulmonologist who told him to stop his Lasix. According to medical records I believe Dr. [name] ment to decrease his lasix to 20 mg. He reports he's been out of his Lasix and has not been taking it in the past 3 days. He has noticed worsening lower leg swelling bilaterally. He reports having intermittent chest pain for the past 3 days but denies any current chest pain. He also complains of bilateral lower abdominal pain without nausea, vomiting or diarrhea. He reports he's been urinating normally. No urinary symptoms. He denies fevers, coughing, hemoptysis, nausea, vomiting, diarrhea, rashes, urinary symptoms, difficulty urinating, or syncope.    The history is provided by the patient and medical records. No language interpreter was used.  Abdominal Pain   Pertinent negatives include fever, diarrhea, nausea, vomiting, dysuria, frequency and headaches.  Shortness of Breath  Associated symptoms include chest pain, abdominal pain and leg swelling. Pertinent negatives include no fever, no headaches, no sore throat, no neck pain, no cough, no wheezing, no vomiting and no rash.    Past Medical History:  Diagnosis Date  . CAD (coronary artery disease)    a. LHC 5/12:  LAD 20, pLCx 20, pRCA 40, dRCA 40, EF 35%, diff HK  //  b. Myoview 4/16: Overall Impression: High risk stress nuclear study There  is no evidence of ischemia. There is severe LV dysfunction. LV Ejection Fraction: 30%. LV Wall Motion: There is global LV hypokinesis.   Marland Kitchen CAP (community acquired pneumonia) 09/2013  . Chronic combined systolic and diastolic CHF (congestive heart failure) (HCC)    a. Echo 4/16:Mild LVH, EF 40-45%, diffuse HK //  b. Echo 8/17: EF 35-40%, diffuse HK, diastolic dysfunction, aortic sclerosis, trivial MR, moderate LAE, normal RVSF, moderate RAE, mild TR, PASP 42 mmHg  . Cluster headache    "hx; haven't had one in awhile" (01/09/2016)  . COPD (chronic obstructive pulmonary disease) (HCC)    [name] 01/09/2016  . History of CVA (cerebrovascular accident)   . Hypertension   . NICM (nonischemic cardiomyopathy) (HCC)   . Tobacco abuse     Patient Active Problem List   Diagnosis Date Noted  . COPD exacerbation (HCC) 06/03/2016  . Prediabetes 05/14/2016  . Hemoptysis 05/06/2016  . COPD GOLD III/still smoking  02/29/2016  . Chronic systolic CHF (congestive heart failure) (HCC)   . Cigarette smoker 01/09/2016  . CAD (coronary artery disease), native coronary artery 10/07/2013  . 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 COPD (chronic obstructive pulmonary disease) (HCC) 10/04/2013  . Pulmonary infiltrate in right lung on CXR 10/03/2013  . Hypertension     Past Surgical History:  Procedure Laterality Date  . CARDIAC CATHETERIZATION  10/2010   LM normal, LAD with 20% irregularities, LCX with 20%, RCA with 40% prox and 40% distal - EF of 35%  .  COLONOSCOPY W/ BIOPSIES AND POLYPECTOMY    . VIDEO BRONCHOSCOPY Bilateral 05/08/2016   Procedure: VIDEO BRONCHOSCOPY WITH FLUORO;  Surgeon: Oretha Milch, MD;  Location: [facility] ENDOSCOPY;  Service: Cardiopulmonary;  Laterality: Bilateral;       Home Medications    Prior to Admission medications   Medication Sig Start Date End Date Taking? Authorizing Provider  albuterol (PROVENTIL HFA;VENTOLIN HFA) 108 (90 Base) MCG/ACT inhaler Inhale 2  puffs into the lungs every 6 (six) hours as needed for wheezing or shortness of breath. 06/26/16  Yes Nyoka Cowden, MD  bisoprolol (ZEBETA) 5 MG tablet Take 1 tablet (5 mg total) by mouth daily. 06/26/16  Yes Nyoka Cowden, MD  Glycopyrrolate-Formoterol (BEVESPI AEROSPHERE) 9-4.8 MCG/ACT AERO Inhale 2 puffs into the lungs 2 (two) times daily.   Yes Historical Provider, MD  potassium chloride SA (K-DUR,KLOR-CON) 20 MEQ tablet Take 2 tablets (40 mEq total) by mouth daily. 06/26/16  Yes Nyoka Cowden, MD  furosemide (LASIX) 20 MG tablet Take 1 tablet (20 mg total) by mouth daily. 06/28/16   Everlene Farrier, PA-C    Family History Family History  Problem Relation Age of Onset  . Cancer Mother   . Heart disease Mother   . Diabetes Mother   . Cancer Father   . Diabetes Father   . Diabetes Sister   . Diabetes Brother     Social History Social History  Substance Use Topics  . Smoking status: Current Every Day Smoker    Packs/day: 0.50    Years: 42.00    Types: Cigarettes  . Smokeless tobacco: Never Used  . Alcohol use 3.6 oz/week    6 Glasses of wine per week     Comment: weekends     Allergies   Lisinopril   Review of Systems Review of Systems  Constitutional: Negative for chills and fever.  HENT: Negative for congestion and sore throat.   Eyes: Negative for visual disturbance.  Respiratory: Positive for shortness of breath. Negative for cough and wheezing.   Cardiovascular: Positive for chest pain and leg swelling. Negative for palpitations.  Gastrointestinal: Positive for abdominal pain. Negative for blood in stool, diarrhea, nausea and vomiting.  Genitourinary: Negative for difficulty urinating, dysuria, frequency and urgency.  Musculoskeletal: Negative for back pain and neck pain.  Skin: Negative for rash.  Neurological: Negative for syncope, light-headedness and headaches.     Physical Exam Updated Vital Signs BP (!) 136/101   Pulse 86   Temp 98.7 F (37.1 C)  (Oral)   Resp 19   SpO2 100%   Physical Exam  Constitutional: He appears well-developed and well-nourished. No distress.  Nontoxic appearing.  HENT:  Head: Normocephalic and atraumatic.  Mouth/Throat: Oropharynx is clear and moist.  Eyes: Conjunctivae are normal. Pupils are equal, round, and reactive to light. Right eye exhibits no discharge. Left eye exhibits no discharge.  Neck: Neck supple. No JVD present.  Cardiovascular: Normal rate, regular rhythm, normal heart sounds and intact distal pulses.   Pulmonary/Chest: Effort normal and breath sounds normal. No stridor. No respiratory distress. He has no wheezes. He has no rales.  Lungs clear auscultation bilaterally.  Abdominal: Soft. Bowel sounds are normal. He exhibits no distension and no mass. There is tenderness. There is no rebound and no guarding.  Abdomen soft. Bowel sounds are present. Patient has mild bilateral lower abdominal tenderness to palpation. No focal tenderness. No ascites.  Musculoskeletal: He exhibits edema. He exhibits no tenderness.  Bilateral symmetric lower extremity  pitting edema.  Lymphadenopathy:    He has no cervical adenopathy.  Neurological: He is alert. Coordination normal.  Skin: Skin is warm and dry. Capillary refill takes less than 2 seconds. No rash noted. He is not diaphoretic. No erythema. No pallor.  Psychiatric: He has a normal mood and affect. His behavior is normal.  Nursing note and vitals reviewed.    ED Treatments / Results  Labs (all labs ordered are listed, but only abnormal results are displayed) Labs Reviewed  BASIC METABOLIC PANEL - Abnormal; Notable for the following:       Result Value   Glucose, Bld 121 (*)    BUN 27 (*)    Creatinine, Ser 1.25 (*)    All other components within normal limits  URINALYSIS, ROUTINE W REFLEX MICROSCOPIC - Abnormal; Notable for the following:    Ketones, ur 5 (*)    Protein, ur 100 (*)    Squamous Epithelial / LPF 0-5 (*)    All other  components within normal limits  HEPATIC FUNCTION PANEL - Abnormal; Notable for the following:    Total Protein 5.8 (*)    Albumin 3.4 (*)    AST 144 (*)    ALT 118 (*)    Alkaline Phosphatase 199 (*)    Total Bilirubin 1.4 (*)    Indirect Bilirubin 1.0 (*)    All other components within normal limits  CBC  I-STAT TROPOININ, ED    EKG  EKG Interpretation  Date/Time:  Thursday June 28 2016 16:54:53 EST Ventricular Rate:  90 PR Interval:  134 QRS Duration: 82 QT Interval:  354 QTC Calculation: 433 R Axis:   -82 Text Interpretation:  Sinus rhythm with Premature atrial complexes Left axis deviation Anterior infarct , age undetermined Abnormal ECG Confirmed by [facility] MD, JULIE ([id number]) on 06/28/2016 7:59:51 PM       Radiology Dg Chest 2 View  Result Date: 06/28/2016 CLINICAL DATA:  Cough and shortness of breath, chest pain and abdominal pain with swelling since yesterday. History of COPD. EXAM: CHEST  2 VIEW COMPARISON:  Chest x-rays dated 06/03/2016, 05/28/2016 and 05/08/2016. FINDINGS: Cardiomegaly is not significantly changed compared to previous exams. Atherosclerotic changes noted at the aortic arch. Lungs are hyperexpanded. Coarse interstitial lung markings noted bilaterally suggesting chronic interstitial lung disease. Questionable small pleural effusion at the right costophrenic angle. No large pleural effusion. No pneumothorax. No acute or suspicious osseous finding. IMPRESSION: 1. No acute findings.  No evidence of pneumonia or pulmonary edema. 2. Hyperexpanded lungs suggesting COPD. Suspect associated chronic interstitial lung disease. 3. Cardiomegaly. 4. Aortic atherosclerosis. Electronically Signed   By: Bary Richard M.D.   On: 06/28/2016 17:48   US Abdomen Complete  Result Date: 06/28/2016 CLINICAL DATA:  Chest and abdominal pain. Swelling. Tobacco abuse and hypertension. EXAM: ABDOMEN ULTRASOUND COMPLETE COMPARISON:  05/04/2016 gallbladder ultrasound. Chest CT  06/27/2015 on FINDINGS: Gallbladder: The gallbladder is not distended in appearance. There is a thickened and slightly edematous appearance of the gallbladder wall some which may be due to underdistention and/or sympathetic thickening given trace ascites noted in the right upper quadrant. No cholelithiasis nor sonographic Murphy's. Common bile duct: Diameter:  2 mm.  No choledocholithiasis. Liver: Slightly coarsened liver echotexture with surface nodularity suggesting cirrhosis. No space-occupying mass or biliary dilatation. Patent portal vein. IVC: No abnormality visualized. Pancreas: Visualized portion unremarkable. Spleen: Size and appearance within normal limits. Right Kidney: Length: 12.1 cm. Echogenicity within normal limits. No mass or hydronephrosis visualized. Left Kidney: Length:  11.9 cm. Echogenicity within normal limits. No mass or hydronephrosis visualized. Abdominal aorta: The distal ureter was not visualized. The the proximal and mid aorta are or not aneurysmal with maximum caliber 2.7 cm. Other findings: Small right pleural effusion. IMPRESSION: Coarsened echotexture of the liver with slight surface nodularity suggesting cirrhosis. There may be mild sympathetic thickening from of the gallbladder given trace ascites noted in the right upper quadrant. Some of this may also be due to underdistention. No cholelithiasis. Chronic cholecystitis is not entirely excluded. Electronically Signed   By: Tollie Eth M.D.   On: 06/28/2016 22:45    Procedures Procedures (including critical care time)  Medications Ordered in ED Medications  ipratropium-albuterol (DUONEB) 0.5-2.5 (3) MG/3ML nebulizer solution 3 mL (3 mLs Nebulization Given 06/28/16 1704)  furosemide (LASIX) injection 40 mg (40 mg Intravenous Given 06/28/16 2114)     Initial Impression / Assessment and Plan / ED Course  I have reviewed the triage vital signs and the nursing notes.  Pertinent labs & imaging results that were available  during my care of the patient were reviewed by me and considered in my medical decision making (see chart for details).   This is a 61 y.o. Male with a history of CHF, COPD, and CAD who presents to the emergency room complaining of 3 days of increasing shortness of breath, chest pain, bilateral lower abdominal pain and lower extremity edema. Patient reports his symptoms began about 3 days ago when his Lasix was stopped.  Patient usually takes Lasix 40 mg. He tells me he saw his pulmonologist who told him to stop his Lasix. According to medical records I believe Dr. [name] ment to decrease his lasix to 20 mg. He reports he's been out of his Lasix and has not been taking it in the past 3 days. He has noticed worsening lower leg swelling bilaterally. He reports having intermittent chest pain for the past 3 days but denies any current chest pain. He also complains of bilateral lower abdominal pain without nausea, vomiting or diarrhea. On examination is afebrile nontoxic appearing. His lungs are clear to auscultation bilaterally. No rales or rhonchi. No increased work of breathing. Patient has mild generalized abdominal tenderness to palpation, without focal tenderness. He has bilateral symmetric lower extremity pitting edema. No calf tenderness. Troponin is not elevated. I see no need for delta troponin as the patient has had intermittent chest pain for 3 days. BMP is remarkable for creatinine of 1.25 which is a slight increase from his baseline of 1.1. CBC is unremarkable. Hepatic function panel shows mild elevations of his AST and ALT of 144 and 118 respectively. Total bilirubin is 1.4. Albumin is 3.4.  Chest x-ray shows no acute findings. No evidence of pneumonia or pulmonary edema. Evidence of COPD. Abdominal ultrasound shows coarsened echotexture of the liver with slight surface nodularity suggesting cirrhosis. No cholelithiasis. Patient tells me he does drink alcohol on intermittently. He tells me he last had  alcohol 4 days ago. I encouraged him to stop alcohol completely. We'll have him follow-up with primary care, GI and pulmonology. Patient received 40 mg of IV Lasix in the emergency department. At reevaluation he tells me he is feeling much better. He has no chest pain or shortness of breath. He tells me he feels his swelling in his legs has improved. I suspect the patient's leg swelling, chest pain and shortness of breath related to him stopping Lasix. Will restart him on Lasix 20 mg daily as was noted in  the plan from his pulmonologist. Low suspicion for ACS at this time. Will discharge with close follow up. I advised the patient to follow-up with their primary care provider this week. I advised the patient to return to the emergency department with new or worsening symptoms or new concerns. The patient verbalized understanding and agreement with plan.    This patient was discussed with Dr. Particia Nearing who agrees with assessment and plan.   Final Clinical Impressions(s) / ED Diagnoses   Final diagnoses:  Acute on chronic congestive heart failure, unspecified congestive heart failure type (HCC)  Other cirrhosis of liver (HCC)  Generalized abdominal pain    New Prescriptions Current Discharge Medication List       Everlene Farrier, PA-C 06/29/16 0029    Jacalyn Lefevre, MD 06/29/16 [phone]

## 2016-06-28 NOTE — ED Triage Notes (Signed)
Pt sts cough and SOB, pt sts CP and abd pain with swelling

## 2016-06-29 ENCOUNTER — Encounter: Payer: Self-pay | Admitting: Internal Medicine

## 2016-06-29 MED FILL — FUROSEMIDE 20 MG TABLET: 20 | 30 days supply | Qty: 60 | Fill #1

## 2016-06-29 NOTE — Assessment & Plan Note (Signed)
>   3 min  I took an extended  opportunity with this patient to outline the consequences of continued cigarette use  in airway disorders based on all the data we have from the multiple national lung health studies (perfomed over decades at millions of dollars in cost)  indicating that smoking cessation, not choice of inhalers or physicians, is the most important aspect of care.   

## 2016-06-29 NOTE — Assessment & Plan Note (Signed)
Strongly prefer in this setting: Bystolic, the most beta -1  selective Beta blocker available in sample form, with bisoprolol the most selective generic choice  on the market.   Try bisoprolol  5mg  daily in place of coreg

## 2016-06-29 NOTE — Discharge Instructions (Signed)
Please stop drinking alcohol. Please follow up with your primary care provider.

## 2016-06-29 NOTE — Assessment & Plan Note (Signed)
New on 01/02/16 vs 08/18/15  Dx 02/20/16 ER ? Pna  > Levaquin > repeat cxr due 1st week in Oct 2017  - FOB RLL tbbx 05/08/16  ATYPICAL PNEUMOCYTES PROLIFERATION     - cxr 05/28/2016 95% clear  - CT chest 06/26/2016 1. Diffuse bronchial wall thickening with emphysema, as above; imaging findings suggestive of underlying COPD. 2. Aortic atherosclerosis and 3 vessel coronary artery calcification 3. Small right pleural effusion 4. 7 mm subpleural nodule is stable from 02/20/2016. Future CT at 18-24 months (from 02/20/2016) is considered optional for low-risk patients, but is recommended for high-risk patients > placed in tickle for 18 m  The area of concern in the RLL has resolved, c/w pna  In termo sof the nodule: CT results reviewed with pt >>> Too small for PET or bx, not suspicious enough for excisional bx > really only option for now is follow the Fleischner society guidelines as rec by radiology = f/u in 18 m

## 2016-06-29 NOTE — Assessment & Plan Note (Signed)
02/29/2016  After extensive coaching HFA effectiveness =    75% > continue symbicort  - Spirometry 05/28/2016  FEV1 1.48 (40%)  Ratio 46  p am symbicort 160 2bid   - 06/26/2016  After extensive coaching HFA effectiveness =    90% > bevespi 2 bid and change coreg to bisoprolol 5 mg daily   Pt is Group B in terms of symptom/risk and laba/lama therefore appropriate rx at this point so should do better on laba/lama and does not appear to need the ics > if starts flaring more than once a year and not clearly related to chf then needs to restart ICS  I had an extended discussion with the patient reviewing all relevant studies completed to date and  lasting 15 to 20 minutes of a 25 minute visit    Each maintenance medication was reviewed in detail including most importantly the difference between maintenance and prns and under what circumstances the prns are to be triggered using an action plan format that is not reflected in the computer generated alphabetically organized AVS.    Please see AVS for specific instructions unique to this visit that I personally wrote and verbalized to the the pt in detail and then reviewed with pt  by my nurse highlighting any  changes in therapy recommended at today's visit to their plan of care.

## 2016-07-01 ENCOUNTER — Emergency Department (HOSPITAL_COMMUNITY): Payer: Self-pay

## 2016-07-01 ENCOUNTER — Inpatient Hospital Stay (HOSPITAL_COMMUNITY)
Admission: EM | Admit: 2016-07-01 | Discharge: 2016-07-06 | DRG: 292 | Disposition: A | Discharge status: Home / Self Care | Payer: Self-pay | Attending: Internal Medicine | Admitting: Internal Medicine

## 2016-07-01 ENCOUNTER — Encounter (HOSPITAL_COMMUNITY): Payer: Self-pay

## 2016-07-01 DIAGNOSIS — I5042 Chronic combined systolic (congestive) and diastolic (congestive) heart failure: Secondary | ICD-10-CM

## 2016-07-01 DIAGNOSIS — Z888 Allergy status to other drugs, medicaments and biological substances status: Secondary | ICD-10-CM

## 2016-07-01 DIAGNOSIS — Z7951 Long term (current) use of inhaled steroids: Secondary | ICD-10-CM

## 2016-07-01 DIAGNOSIS — I110 Hypertensive heart disease with heart failure: Principal | ICD-10-CM | POA: Diagnosis present

## 2016-07-01 DIAGNOSIS — Z9109 Other allergy status, other than to drugs and biological substances: Secondary | ICD-10-CM

## 2016-07-01 DIAGNOSIS — I2510 Atherosclerotic heart disease of native coronary artery without angina pectoris: Secondary | ICD-10-CM | POA: Diagnosis present

## 2016-07-01 DIAGNOSIS — Z9119 Patient's noncompliance with other medical treatment and regimen: Secondary | ICD-10-CM

## 2016-07-01 DIAGNOSIS — J432 Centrilobular emphysema: Secondary | ICD-10-CM | POA: Diagnosis present

## 2016-07-01 DIAGNOSIS — J449 Chronic obstructive pulmonary disease, unspecified: Secondary | ICD-10-CM | POA: Diagnosis present

## 2016-07-01 DIAGNOSIS — E875 Hyperkalemia: Secondary | ICD-10-CM | POA: Diagnosis present

## 2016-07-01 DIAGNOSIS — Z9114 Patient's other noncompliance with medication regimen: Secondary | ICD-10-CM

## 2016-07-01 DIAGNOSIS — I428 Other cardiomyopathies: Secondary | ICD-10-CM | POA: Diagnosis present

## 2016-07-01 DIAGNOSIS — Z8673 Personal history of transient ischemic attack (TIA), and cerebral infarction without residual deficits: Secondary | ICD-10-CM

## 2016-07-01 DIAGNOSIS — I4892 Unspecified atrial flutter: Secondary | ICD-10-CM | POA: Diagnosis present

## 2016-07-01 DIAGNOSIS — F17210 Nicotine dependence, cigarettes, uncomplicated: Secondary | ICD-10-CM | POA: Diagnosis present

## 2016-07-01 DIAGNOSIS — I5043 Acute on chronic combined systolic (congestive) and diastolic (congestive) heart failure: Secondary | ICD-10-CM | POA: Diagnosis present

## 2016-07-01 DIAGNOSIS — Z79899 Other long term (current) drug therapy: Secondary | ICD-10-CM

## 2016-07-01 DIAGNOSIS — I10 Essential (primary) hypertension: Secondary | ICD-10-CM | POA: Diagnosis present

## 2016-07-01 DIAGNOSIS — R109 Unspecified abdominal pain: Secondary | ICD-10-CM | POA: Diagnosis present

## 2016-07-01 DIAGNOSIS — I5082 Biventricular heart failure: Secondary | ICD-10-CM | POA: Diagnosis present

## 2016-07-01 DIAGNOSIS — K761 Chronic passive congestion of liver: Secondary | ICD-10-CM | POA: Diagnosis present

## 2016-07-01 LAB — COMPREHENSIVE METABOLIC PANEL
ALT: 132 U/L — ABNORMAL HIGH (ref 17–63)
AST: 118 U/L — ABNORMAL HIGH (ref 15–41)
Albumin: 3.3 g/dL — ABNORMAL LOW (ref 3.5–5.0)
Alkaline Phosphatase: 216 U/L — ABNORMAL HIGH (ref 38–126)
Anion gap: 6 (ref 5–15)
BUN: 23 mg/dL — ABNORMAL HIGH (ref 6–20)
CO2: 30 mmol/L (ref 22–32)
Calcium: 8.8 mg/dL — ABNORMAL LOW (ref 8.9–10.3)
Chloride: 106 mmol/L (ref 101–111)
Creatinine, Ser: 1.37 mg/dL — ABNORMAL HIGH (ref 0.61–1.24)
GFR calc Af Amer: >60 mL/min (ref >60)
GFR calc non Af Amer: 54 mL/min — ABNORMAL LOW (ref >60)
Glucose, Bld: 154 mg/dL — ABNORMAL HIGH (ref 65–99)
Potassium: 4.8 mmol/L (ref 3.5–5.1)
Sodium: 142 mmol/L (ref 135–145)
Total Bilirubin: 1.1 mg/dL (ref 0.3–1.2)
Total Protein: 5.6 g/dL — ABNORMAL LOW (ref 6.5–8.1)

## 2016-07-01 LAB — URINALYSIS, ROUTINE W REFLEX MICROSCOPIC
Bilirubin Urine: NEGATIVE
Glucose, UA: NEGATIVE mg/dL
Hgb urine dipstick: NEGATIVE
Ketones, ur: NEGATIVE mg/dL
Leukocytes, UA: NEGATIVE
Nitrite: NEGATIVE
Protein, ur: 100 mg/dL — AB
Specific Gravity, Urine: 1.025 (ref 1.005–1.030)
Squamous Epithelial / LPF: NONE SEEN
pH: 5 (ref 5.0–8.0)

## 2016-07-01 LAB — CBC
HCT: 47.7 % (ref 39.0–52.0)
Hemoglobin: 15 g/dL (ref 13.0–17.0)
MCH: 30.7 pg (ref 26.0–34.0)
MCHC: 31.4 g/dL (ref 30.0–36.0)
MCV: 97.5 fL (ref 78.0–100.0)
Platelets: 194 10*3/uL (ref 150–400)
RBC: 4.89 MIL/uL (ref 4.22–5.81)
RDW: 15.4 % (ref 11.5–15.5)
WBC: 6.9 10*3/uL (ref 4.0–10.5)

## 2016-07-01 LAB — LIPASE, BLOOD: Lipase: 47 U/L (ref 11–51)

## 2016-07-01 LAB — I-STAT TROPONIN, ED: Troponin i, poc: 0.03 ng/mL (ref 0.00–0.08)

## 2016-07-01 LAB — BRAIN NATRIURETIC PEPTIDE: B Natriuretic Peptide: 728.8 pg/mL — ABNORMAL HIGH (ref 0.0–100.0)

## 2016-07-01 MED ORDER — METHYLPREDNISOLONE SODIUM SUCC 125 MG IJ SOLR
125.0000 mg | Freq: Once | INTRAMUSCULAR | Status: AC
  Administered 2016-07-01: 125 mg via INTRAVENOUS
  Filled 2016-07-01: qty 2

## 2016-07-01 MED ORDER — IPRATROPIUM-ALBUTEROL 0.5-2.5 (3) MG/3ML IN SOLN
3.0000 mL | Freq: Once | RESPIRATORY_TRACT | Status: AC
  Administered 2016-07-01: 3 mL via RESPIRATORY_TRACT
  Filled 2016-07-01: qty 3

## 2016-07-01 MED ORDER — FUROSEMIDE 10 MG/ML IJ SOLN
40.0000 mg | Freq: Once | INTRAMUSCULAR | Status: AC
  Administered 2016-07-01: 40 mg via INTRAVENOUS
  Filled 2016-07-01: qty 4

## 2016-07-01 NOTE — ED Triage Notes (Signed)
Pt complaining of SOB and abdominal distention x 3 days. Pt with hx of COPD, CHF, and "beginnings of liver failure." Pt sats 87% on RA at triage. Pt complaining of fluid in abdomen. Pt also complaining of some L sided chest pain.

## 2016-07-01 NOTE — ED Notes (Signed)
Floor does not have bed currently, given report will wait for phone call for pt when pt has bed delievered

## 2016-07-01 NOTE — ED Notes (Signed)
Pt ambulated to Rr7 from B17. SOB when he got back to room

## 2016-07-01 NOTE — H&P (Signed)
History and Physical  Patient Name: Jared [name]     ZOX:[id number]    DOB: Dec 26, 1955    DOA: 07/01/2016 PCP: Jaclyn Shaggy, MD   Patient coming from: Home  Chief Complaint: Dyspnea  HPI: Jared [name] is a 61 y.o. male with a past medical history significant for COPD not no home O2, CHF EF 35% who presents with 1 week worsening dyspnea, orthopnea, leg swelling.  The patient was in his usual state of health until about a week ago when he ran out of Lasix (he told others that is pulmonology also told him to stop or reduce his Lasix). Since then he has had progressive shortness of breath with exertion, wheezing and cough and then around midweek he started to have orthopnea requiring that he sleep on the couch, as well as leg swelling that progressed up to his mid back.  He came to the ER 3 days ago, was stable, and discharged home with a Lasix refill, but states today that he couldn't get this filled because the pharmacy told him he had to wait 48 hours; in the interim his symptoms worsened, so today he came to the ER again.  No fever, chills.  Chronic sputum production and wheeze, slightly worse now.    ED course: -Afebrile, heart rate 82, respirations labored, pulse oximetry mid-80s to 90% on room air, blood pressure 122/75 -Na 142, K 4.8, Cr 1.37 (baseline 1.1), WBC 6.9K, Hgb 15 -AST/ALT 118 and 132, Tbili normal -Lipase normal -Troponin normal -UA bland -CXR without focal opacity, appeared to have increased interstitial markings and effusions -Bedside US confirmed effusions, also showed pleural edema -ECG was nonischemic -He was given furosemide, Solu-medrol and Duoneb and TRH were asked to evaluate for hypoxic respiratory failure    Of note, the patient was admitted for CHF in August last year, had elevated LFTs at that time like this, had negative hepatitis serologies, was diuresed and improved.       ROS: Review of Systems  Constitutional: Negative for chills, fever and  malaise/fatigue.  Respiratory: Positive for cough, sputum production, shortness of breath and wheezing.   Cardiovascular: Positive for orthopnea and leg swelling. Negative for chest pain and palpitations.  All other systems reviewed and are negative.         Past Medical History:  Diagnosis Date  . CAD (coronary artery disease)    a. LHC 5/12:  LAD 20, pLCx 20, pRCA 40, dRCA 40, EF 35%, diff HK  //  b. Myoview 4/16: Overall Impression: High risk stress nuclear study There is no evidence of ischemia. There is severe LV dysfunction. LV Ejection Fraction: 30%. LV Wall Motion: There is global LV hypokinesis.   Marland Kitchen CAP (community acquired pneumonia) 09/2013  . Chronic combined systolic and diastolic CHF (congestive heart failure) (HCC)    a. Echo 4/16:Mild LVH, EF 40-45%, diffuse HK //  b. Echo 8/17: EF 35-40%, diffuse HK, diastolic dysfunction, aortic sclerosis, trivial MR, moderate LAE, normal RVSF, moderate RAE, mild TR, PASP 42 mmHg  . Cluster headache    "hx; haven't had one in awhile" (01/09/2016)  . COPD (chronic obstructive pulmonary disease) (HCC)    [name] 01/09/2016  . History of CVA (cerebrovascular accident)   . Hypertension   . NICM (nonischemic cardiomyopathy) (HCC)   . Tobacco abuse     Past Surgical History:  Procedure Laterality Date  . CARDIAC CATHETERIZATION  10/2010   LM normal, LAD with 20% irregularities, LCX with 20%, RCA with 40% prox and  40% distal - EF of 35%  . COLONOSCOPY W/ BIOPSIES AND POLYPECTOMY    . VIDEO BRONCHOSCOPY Bilateral 05/08/2016   Procedure: VIDEO BRONCHOSCOPY WITH FLUORO;  Surgeon: Oretha Milch, MD;  Location[facility] ENDOSCOPY;  Service: Cardiopulmonary;  Laterality: Bilateral;    Social History: Patient lives with his wife.  The patient walks unassisted.  He smokes 10 cigs daily.  He does not drink to excess.  He is from [place], used to make boats.    Allergies  Allergen Reactions  . Lisinopril Cough    Family history: family history  includes Cancer in his father and mother; Diabetes in his brother, father, mother, and sister; Heart disease in his mother.  Prior to Admission medications   Medication Sig Start Date End Date Taking? Authorizing Provider  albuterol (PROVENTIL HFA;VENTOLIN HFA) 108 (90 Base) MCG/ACT inhaler Inhale 2 puffs into the lungs every 6 (six) hours as needed for wheezing or shortness of breath. 06/26/16  Yes Nyoka Cowden, MD  bisoprolol (ZEBETA) 5 MG tablet Take 1 tablet (5 mg total) by mouth daily. 06/26/16  Yes Nyoka Cowden, MD  Glycopyrrolate-Formoterol (BEVESPI AEROSPHERE) 9-4.8 MCG/ACT AERO Inhale 2 puffs into the lungs 2 (two) times daily.   Yes Historical Provider, MD  potassium chloride SA (K-DUR,KLOR-CON) 20 MEQ tablet Take 2 tablets (40 mEq total) by mouth daily. 06/26/16  Yes Nyoka Cowden, MD  furosemide (LASIX) 20 MG tablet Take 1 tablet (20 mg total) by mouth daily. Patient not taking: Reported on 07/01/2016 06/28/16   Everlene Farrier, PA-C       Physical Exam: BP 119/87   Pulse 72   Temp 98.8 F (37.1 C) (Oral)   Resp 23   Ht 6\' 3"  (1.905 m)   Wt 100.7 kg (222 lb 1 oz)   SpO2 100%   BMI 27.76 kg/m  General appearance: Well-developed, adult male, alert and in mild respiratory distress.   Eyes: Anicteric, conjunctiva pink, lids and lashes normal. PERRL.    ENT: No nasal deformity, discharge, epistaxis.  Hearing normal. OP moist without lesions.   Neck: No neck masses.  Trachea midline.  No thyromegaly/tenderness. Lymph: No cervical or supraclavicular lymphadenopathy. Skin: Warm and dry.  No jaundice.  No suspicious rashes or lesions. Cardiac: RRR, nl S1-S2, no murmurs appreciated.  Capillary refill is brisk.  JVP elevated, EJs distended.  3+ pitting LE edema up to low back, dependently.  Radial pulses 2+ and symmetric. No carotid bruits. Respiratory: Normal respiratory rate and rhythm.  Wheezes when he coughs, not with inspiration.  Crackles at bases, diminished. Abdomen: Abdomen  soft.  Mild right TTP. No ascites, distension, hepatosplenomegaly.   MSK: No deformities or effusions.  No cyanosis or clubbing. Neuro: Cranial nerves normal.  Sensation intact to light touch. Speech is fluent.  Muscle strength normal.    Psych: Sensorium intact and responding to questions, attention normal.  Behavior appropriate.  Affect normal.  Judgment and insight appear normal.     Labs on Admission:  I have personally reviewed following labs and imaging studies: CBC:  Recent Labs Lab 06/28/16 1711 07/01/16 2019  WBC 6.5 6.9  HGB 14.7 15.0  HCT 46.8 47.7  MCV 96.9 97.5  PLT 191 194   Basic Metabolic Panel:  Recent Labs Lab 06/28/16 1711 07/01/16 2019  NA 143 142  K 4.9 4.8  CL 108 106  CO2 28 30  GLUCOSE 121* 154*  BUN 27* 23*  CREATININE 1.25* 1.37*  CALCIUM 9.0 8.8*  GFR: Estimated Creatinine Clearance: 66.8 mL/min (by C-G formula based on SCr of 1.37 mg/dL (H)).  Liver Function Tests:  Recent Labs Lab 06/28/16 2128 07/01/16 2019  AST 144* 118*  ALT 118* 132*  ALKPHOS 199* 216*  BILITOT 1.4* 1.1  PROT 5.8* 5.6*  ALBUMIN 3.4* 3.3*    Recent Labs Lab 07/01/16 2019  LIPASE 47   No results for input(s): AMMONIA in the last 168 hours. Coagulation Profile: No results for input(s): INR, PROTIME in the last 168 hours. Cardiac Enzymes: No results for input(s): CKTOTAL, CKMB, CKMBINDEX, TROPONINI in the last 168 hours. BNP (last 3 results) No results for input(s): PROBNP in the last 8760 hours. HbA1C: No results for input(s): HGBA1C in the last 72 hours. CBG: No results for input(s): GLUCAP in the last 168 hours. Lipid Profile: No results for input(s): CHOL, HDL, LDLCALC, TRIG, CHOLHDL, LDLDIRECT in the last 72 hours. Thyroid Function Tests: No results for input(s): TSH, T4TOTAL, FREET4, T3FREE, THYROIDAB in the last 72 hours. Anemia Panel: No results for input(s): VITAMINB12, FOLATE, FERRITIN, TIBC, IRON, RETICCTPCT in the last 72  hours. Sepsis Labs: Invalid input(s): PROCALCITONIN, LACTICIDVEN No results found for this or any previous visit (from the past 240 hour(s)).       Radiological Exams on Admission: Personally reviewed CXR shows increased interstitial markings.: Dg Chest 2 View  Result Date: 07/01/2016 CLINICAL DATA:  61 year old male with chest pain and shortness of breath. History of COPD and CHF. EXAM: CHEST  2 VIEW COMPARISON:  Chest radiograph dated 06/28/2016 and CT dated 02/20/2016. FINDINGS: There is emphysematous changes of the lungs. No focal consolidation, pleural effusion, or pneumothorax. There is persistent mild elevation of the right hemidiaphragm. Right lung base infiltrate or a subpulmonic effusion is not entirely excluded. There is stable cardiomegaly. The osseous structures are intact. IMPRESSION: Emphysema.  No focal consolidation or pneumothorax. Stable mild elevation of the right hemidiaphragm. Stable cardiomegaly.  No congestive changes or interstitial edema. Electronically Signed   By: Elgie Collard M.D.   On: 07/01/2016 21:37    EKG: Independently reviewed. Rate 87, QTc normal, no ST changes.  Echocardiogram August 2017: EF 35-40% DD, not quantified PAP 42    Assessment/Plan  1. Acute on chronic systolic and diastolic CHF:  Some degree of left and right heart failure in patient with moderate to severe pulmonary disease, no known sleep apnea.   -Furosemide 40 mg BID -Potassium supplement -Strict ins and outs, daily weights -Daily BMP -His ARB was stopped last year when Entresto was trialed.  If his BP allows, this should be restarted -Continue BB -Supplemental O2   2. Elevated LFTs:  Suspect congestive hepatopathy.   -Trend LFT  3. COPD:  -Continue Brevespi as formulary alternatives -Albuterol PRN -No wheezing on exam, so steroids/scheduled BDs deferred; if good diuresis and still wheezing/dyspneic, will start steroids  4. Smoking:  Smoking cessation recommended,  specific modalities discussed -Nursing smoking teaching -Nicotine patch      DVT prophylaxis: Lovenox  Code Status: FULL  Family Communication: None present  Disposition Plan: Anticipate IV diuresis and monitor I/Os and electrolytes closely Consults called: None Admission status: INPATIENT        Medical decision making: Patient seen at 11:51 PM on 07/01/2016.  The patient was discussed with Dr. Shanon Rosser.  What exists of the patient's chart was reviewed in depth and summarized above.  Clinical condition: stable.        Alberteen Sam [facility] Pager [phone]  At the time of admission, it appears that the appropriate admission status for this patient is INPATIENT. This is judged to be reasonable and necessary in order to provide the required intensity of service to ensure the patient's safety given the presenting symptoms, physical exam findings, and initial radiographic and laboratory data in the context of their chronic comorbidities.  Together, these circumstances are felt to place him at high risk for further clinical deterioration threatening life, limb, or organ.   Patient requires inpatient status due to high intensity of service, high risk for further deterioration and high frequency of surveillance required because of this severe exacerbation of their chronic organ failure.  Factors supporting this include: Presentation in respiratory distress with exam consistent with significant volume overload, likely requiring >2 days IV diuresis  I certify that at the point of admission it is my clinical judgment that the patient will require inpatient hospital care spanning beyond 2 midnights from the point of admission and that early discharge would result in unnecessary risk of decompensation and readmission or threat to life, limb or bodily function.

## 2016-07-01 NOTE — ED Provider Notes (Signed)
[facility]-EMERGENCY DEPT Provider Note   CSN: [id number] Arrival date & time: 07/01/16  1950     History   Chief Complaint Chief Complaint  Patient presents with  . Shortness of Breath  . Chest Pain    HPI Derin Granquist is a 61 y.o. male.  HPI 61 year old male with a history of CAD and CHF with an EF of 35-40%, COPD, CVA, HTN presenting with 1 week of worsening shortness of breath, chest pain, LE edema, and one day of mucousy stool. He states that he has been seen several times for his shortness of breath and given a breathing treatment and sent home. He states his chest pain is intermittent and sharp substernal and slightly worsens with exertion but is not pleuritic. Pain does not radiate. He endorses only very mild pain right now. It is shortness of breath worsens on exertion. He does not use oxygen at home. He states that he had a bowel movement today and noticed that there was mucus in his stool. He denies blood in the stool. Endorses mild abdominal pain and concerns for distention as he was told last time he had "early stages of liver failure". He states that he was seen 5 days ago and told to stop taking his Lasix and therefore he has not taken it. He was given a prescription 2 days ago when he came to the ED when he was seen as it was noted that there was a miscommunication and the provider's note wanted him to decrease the Lasix from 40 mg to 20 mg. Patient states that he took the prescription to the pharmacy however they told him it would be 48 hours before he can pick it up.  Past Medical History:  Diagnosis Date  . CAD (coronary artery disease)    a. LHC 5/12:  LAD 20, pLCx 20, pRCA 40, dRCA 40, EF 35%, diff HK  //  b. Myoview 4/16: Overall Impression: High risk stress nuclear study There is no evidence of ischemia. There is severe LV dysfunction. LV Ejection Fraction: 30%. LV Wall Motion: There is global LV hypokinesis.   Marland Kitchen CAP (community acquired pneumonia) 09/2013  . Chronic  combined systolic and diastolic CHF (congestive heart failure) (HCC)    a. Echo 4/16:Mild LVH, EF 40-45%, diffuse HK //  b. Echo 8/17: EF 35-40%, diffuse HK, diastolic dysfunction, aortic sclerosis, trivial MR, moderate LAE, normal RVSF, moderate RAE, mild TR, PASP 42 mmHg  . Cluster headache    "hx; haven't had one in awhile" (01/09/2016)  . COPD (chronic obstructive pulmonary disease) (HCC)    [name] 01/09/2016  . History of CVA (cerebrovascular accident)   . Hypertension   . NICM (nonischemic cardiomyopathy) (HCC)   . Tobacco abuse     Patient Active Problem List   Diagnosis Date Noted  . Hepatic congestion 07/02/2016  . Acute on chronic combined systolic and diastolic CHF (congestive heart failure) (HCC) 07/01/2016  . COPD exacerbation (HCC) 06/03/2016  . Prediabetes 05/14/2016  . Hemoptysis 05/06/2016  . COPD GOLD III/still smoking  02/29/2016  . Chronic systolic CHF (congestive heart failure) (HCC)   . Cigarette smoker 01/09/2016  . CAD (coronary artery disease), native coronary artery 10/07/2013  . 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 Centrilobular emphysema (HCC) 10/04/2013  . Pulmonary infiltrate in right lung on CXR 10/03/2013  . Essential hypertension     Past Surgical History:  Procedure Laterality Date  . CARDIAC CATHETERIZATION  10/2010   LM normal, LAD with 20% irregularities,  LCX with 20%, RCA with 40% prox and 40% distal - EF of 35%  . COLONOSCOPY W/ BIOPSIES AND POLYPECTOMY    . VIDEO BRONCHOSCOPY Bilateral 05/08/2016   Procedure: VIDEO BRONCHOSCOPY WITH FLUORO;  Surgeon: Oretha Milch, MD;  Location: [facility] ENDOSCOPY;  Service: Cardiopulmonary;  Laterality: Bilateral;       Home Medications    Prior to Admission medications   Medication Sig Start Date End Date Taking? Authorizing Provider  albuterol (PROVENTIL HFA;VENTOLIN HFA) 108 (90 Base) MCG/ACT inhaler Inhale 2 puffs into the lungs every 6 (six) hours as needed for wheezing or shortness  of breath. 06/26/16  Yes Nyoka Cowden, MD  bisoprolol (ZEBETA) 5 MG tablet Take 1 tablet (5 mg total) by mouth daily. 06/26/16  Yes Nyoka Cowden, MD  Glycopyrrolate-Formoterol (BEVESPI AEROSPHERE) 9-4.8 MCG/ACT AERO Inhale 2 puffs into the lungs 2 (two) times daily.   Yes Historical Provider, MD  potassium chloride SA (K-DUR,KLOR-CON) 20 MEQ tablet Take 2 tablets (40 mEq total) by mouth daily. 06/26/16  Yes Nyoka Cowden, MD  furosemide (LASIX) 20 MG tablet Take 1 tablet (20 mg total) by mouth daily. Patient not taking: Reported on 07/01/2016 06/28/16   Everlene Farrier, PA-C    Family History Family History  Problem Relation Age of Onset  . Cancer Mother   . Heart disease Mother   . Diabetes Mother   . Cancer Father   . Diabetes Father   . Diabetes Sister   . Diabetes Brother     Social History Social History  Substance Use Topics  . Smoking status: Current Every Day Smoker    Packs/day: 0.50    Years: 42.00    Types: Cigarettes  . Smokeless tobacco: Never Used  . Alcohol use 3.6 oz/week    6 Glasses of wine per week     Comment: weekends     Allergies   Lisinopril   Review of Systems Review of Systems  Constitutional: Negative for chills and fever.  HENT: Negative for ear pain and sore throat.   Eyes: Negative for pain and visual disturbance.  Respiratory: Positive for cough and shortness of breath.   Cardiovascular: Positive for chest pain and leg swelling. Negative for palpitations.  Gastrointestinal: Positive for abdominal pain. Negative for anal bleeding, blood in stool, nausea and vomiting.  Genitourinary: Negative for dysuria and hematuria.  Musculoskeletal: Negative for arthralgias and back pain.  Skin: Negative for color change and rash.  Neurological: Negative for seizures and syncope.  All other systems reviewed and are negative.    Physical Exam Updated Vital Signs BP 139/89   Pulse 72   Temp 98.8 F (37.1 C) (Oral)   Resp 19   Ht 6\' 3"  (1.905  m)   Wt 222 lb 1 oz (100.7 kg)   SpO2 97%   BMI 27.76 kg/m   Physical Exam  Constitutional: He is oriented to person, place, and time. He appears well-developed and well-nourished.  HENT:  Head: Normocephalic and atraumatic.  Eyes: EOM are normal. Pupils are equal, round, and reactive to light.  Neck: Normal range of motion. Neck supple.  Cardiovascular: Normal rate, regular rhythm, S1 normal, S2 normal, normal heart sounds, intact distal pulses and normal pulses.   No murmur heard. Pulmonary/Chest: No tachypnea. No respiratory distress. He has decreased breath sounds. He has wheezes (diffuse). He has rales in the right lower field and the left lower field.  Abdominal: Soft. He exhibits no distension. There is no tenderness. There  is no rigidity, no rebound and no guarding.  Musculoskeletal: He exhibits no tenderness or deformity.  3+ pitting edema in B/l LE up to hips.  Neurological: He is oriented to person, place, and time.  Skin: Skin is warm. Capillary refill takes less than 2 seconds.  Nursing note and vitals reviewed.    ED Treatments / Results  Labs (all labs ordered are listed, but only abnormal results are displayed) Labs Reviewed  COMPREHENSIVE METABOLIC PANEL - Abnormal; Notable for the following:       Result Value   Glucose, Bld 154 (*)    BUN 23 (*)    Creatinine, Ser 1.37 (*)    Calcium 8.8 (*)    Total Protein 5.6 (*)    Albumin 3.3 (*)    AST 118 (*)    ALT 132 (*)    Alkaline Phosphatase 216 (*)    GFR calc non Af Amer 54 (*)    All other components within normal limits  URINALYSIS, ROUTINE W REFLEX MICROSCOPIC - Abnormal; Notable for the following:    Color, Urine AMBER (*)    Protein, ur 100 (*)    Bacteria, UA RARE (*)    All other components within normal limits  BRAIN NATRIURETIC PEPTIDE - Abnormal; Notable for the following:    B Natriuretic Peptide 728.8 (*)    All other components within normal limits  LIPASE, BLOOD  CBC  I-STAT TROPOININ,  ED    EKG  EKG Interpretation  Date/Time:  Sunday July 01 2016 20:01:08 EST Ventricular Rate:  87 PR Interval:  112 QRS Duration: 90 QT Interval:  386 QTC Calculation: 464 R Axis:   -92 Text Interpretation:  Sinus rhythm with Premature supraventricular complexes Right superior axis deviation Abnormal ECG Confirmed by Hazelyn Kallen MD, Nussen Pullin [phone]) on 07/02/2016 12:42:25 AM       Radiology Dg Chest 2 View  Result Date: 07/01/2016 CLINICAL DATA:  61 year old male with chest pain and shortness of breath. History of COPD and CHF. EXAM: CHEST  2 VIEW COMPARISON:  Chest radiograph dated 06/28/2016 and CT dated 02/20/2016. FINDINGS: There is emphysematous changes of the lungs. No focal consolidation, pleural effusion, or pneumothorax. There is persistent mild elevation of the right hemidiaphragm. Right lung base infiltrate or a subpulmonic effusion is not entirely excluded. There is stable cardiomegaly. The osseous structures are intact. IMPRESSION: Emphysema.  No focal consolidation or pneumothorax. Stable mild elevation of the right hemidiaphragm. Stable cardiomegaly.  No congestive changes or interstitial edema. Electronically Signed   By: Elgie Collard M.D.   On: 07/01/2016 21:37    Procedures Procedures (including critical care time) Emergency Ultrasound: Limited Thoracic Performed and interpreted by Dr. Shanon Rosser, under supervision of my attending, Dr Jodi Mourning Longitudinal view of anterior left and right lung fields in real-time with linear probe. Indication: Concern for pulm edema Findings: positive lung sliding. Positive B lines In b/l Lower lung fields with pleural effusion in R lower lung field. Interpretation: no evidence of pneumothorax. [place] evidence of pleural effusion and pulm edema. Images electronically archived.      Medications Ordered in ED Medications  ipratropium-albuterol (DUONEB) 0.5-2.5 (3) MG/3ML nebulizer solution 3 mL (3 mLs Nebulization Given 07/01/16 2131)    furosemide (LASIX) injection 40 mg (40 mg Intravenous Given 07/01/16 2235)  methylPREDNISolone sodium succinate (SOLU-MEDROL) 125 mg/2 mL injection 125 mg (125 mg Intravenous Given 07/01/16 2237)     Initial Impression / Assessment and Plan / ED Course  I have reviewed the triage  vital signs and the nursing notes.  Pertinent labs & imaging results that were available during my care of the patient were reviewed by me and considered in my medical decision making (see chart for details).    61 year old male presenting with worsening shortness of breath, lower extremity edema, one episode of mucousy stool. Denies bloody stool or severe abdominal pain. No family or personal history of IBD. No abd TTP. Can do outpt management of mucousy stool. Patient not requiring oxygen for hypoxia as he was 87% in triage. He is given a DuoNeb with mild improvement of dyspnea however patient still requiring O2. Patient does not use oxygen at home.  Chest x-ray shows emphysema with no focal consolidation. Labs ordered in triage and notable for a negative UA, negative troponin, normal lipase and an unremarkable CBC. CMP notable for slightly elevated creatinine of 1.3 and elevated transaminases comparable to his last labs. BNP was noted to be 728.  Bedside ultrasound shows signs of pulmonary edema and a pleural effusion in the right lower lung field.  Patient given IV Lasix and will be admitted for CHF exacerbation with continued O2 requirement  Patient care discussed and supervised by my attending, Dr. Jodi Mourning. Azalia Bilis, MD   Final Clinical Impressions(s) / ED Diagnoses   Final diagnoses:  None    New Prescriptions New Prescriptions   No medications on file     Nathan Italy Page, MD 07/02/16 [phone]  Medical screening examination/treatment/procedure(s) were conducted as a shared visit with non-physician practitioner(s) or resident  and myself.  I personally evaluated the patient during the encounter and  agree with the findings.   I have personally reviewed any xrays and/ or EKG's with the provider and I agree with interpretation.  I was present and agree with POCUS. No diagnosis found.     Blane Ohara, MD 07/02/16 [phone]

## 2016-07-02 ENCOUNTER — Inpatient Hospital Stay: Payer: Medicaid Other

## 2016-07-02 DIAGNOSIS — K761 Chronic passive congestion of liver: Secondary | ICD-10-CM | POA: Diagnosis present

## 2016-07-02 LAB — HEPATIC FUNCTION PANEL
ALT: 130 U/L — ABNORMAL HIGH (ref 17–63)
AST: 95 U/L — ABNORMAL HIGH (ref 15–41)
Albumin: 3.4 g/dL — ABNORMAL LOW (ref 3.5–5.0)
Alkaline Phosphatase: 214 U/L — ABNORMAL HIGH (ref 38–126)
Bilirubin, Direct: 0.3 mg/dL (ref 0.1–0.5)
Indirect Bilirubin: 0.8 mg/dL (ref 0.3–0.9)
Total Bilirubin: 1.1 mg/dL (ref 0.3–1.2)
Total Protein: 5.8 g/dL — ABNORMAL LOW (ref 6.5–8.1)

## 2016-07-02 LAB — CBC
HCT: 49.1 % (ref 39.0–52.0)
Hemoglobin: 15.2 g/dL (ref 13.0–17.0)
MCH: 30.3 pg (ref 26.0–34.0)
MCHC: 31 g/dL (ref 30.0–36.0)
MCV: 98 fL (ref 78.0–100.0)
Platelets: 196 10*3/uL (ref 150–400)
RBC: 5.01 MIL/uL (ref 4.22–5.81)
RDW: 15.4 % (ref 11.5–15.5)
WBC: 5.9 10*3/uL (ref 4.0–10.5)

## 2016-07-02 LAB — BASIC METABOLIC PANEL
Anion gap: 6 (ref 5–15)
BUN: 22 mg/dL — ABNORMAL HIGH (ref 6–20)
CO2: 31 mmol/L (ref 22–32)
Calcium: 8.7 mg/dL — ABNORMAL LOW (ref 8.9–10.3)
Chloride: 104 mmol/L (ref 101–111)
Creatinine, Ser: 1.28 mg/dL — ABNORMAL HIGH (ref 0.61–1.24)
GFR calc Af Amer: >60 mL/min (ref >60)
GFR calc non Af Amer: 58 mL/min — ABNORMAL LOW (ref >60)
Glucose, Bld: 168 mg/dL — ABNORMAL HIGH (ref 65–99)
Potassium: 5.4 mmol/L — ABNORMAL HIGH (ref 3.5–5.1)
Sodium: 141 mmol/L (ref 135–145)

## 2016-07-02 MED ORDER — ENOXAPARIN SODIUM 40 MG/0.4ML ~~LOC~~ SOLN
40.0000 mg | SUBCUTANEOUS | Status: DC
  Administered 2016-07-02 – 2016-07-03 (×2): 40 mg via SUBCUTANEOUS
  Filled 2016-07-02 (×2): qty 0.4

## 2016-07-02 MED ORDER — FUROSEMIDE 10 MG/ML IJ SOLN
40.0000 mg | Freq: Three times a day (TID) | INTRAMUSCULAR | Status: DC
  Administered 2016-07-02 (×2): 40 mg via INTRAVENOUS
  Filled 2016-07-02 (×2): qty 4

## 2016-07-02 MED ORDER — SODIUM CHLORIDE 0.9% FLUSH
3.0000 mL | Freq: Two times a day (BID) | INTRAVENOUS | Status: DC
Start: 2016-07-02 — End: 2016-07-06
  Administered 2016-07-02 – 2016-07-06 (×9): 3 mL via INTRAVENOUS

## 2016-07-02 MED ORDER — ALBUTEROL SULFATE (2.5 MG/3ML) 0.083% IN NEBU: 2.5000 mg | INHALATION_SOLUTION | RESPIRATORY_TRACT | Status: DC | PRN

## 2016-07-02 MED ORDER — BISOPROLOL FUMARATE 5 MG PO TABS
5.0000 mg | ORAL_TABLET | Freq: Every day | ORAL | Status: DC
  Administered 2016-07-02 – 2016-07-04 (×3): 5 mg via ORAL
  Filled 2016-07-02 (×3): qty 1

## 2016-07-02 MED ORDER — ACETAMINOPHEN 650 MG RE SUPP: 650.0000 mg | Freq: Four times a day (QID) | RECTAL | Status: DC | PRN

## 2016-07-02 MED ORDER — NICOTINE 14 MG/24HR TD PT24
14.0000 mg | MEDICATED_PATCH | Freq: Every day | TRANSDERMAL | Status: DC
  Administered 2016-07-02 – 2016-07-06 (×5): 14 mg via TRANSDERMAL
  Filled 2016-07-02 (×5): qty 1

## 2016-07-02 MED ORDER — FUROSEMIDE 10 MG/ML IJ SOLN
40.0000 mg | Freq: Two times a day (BID) | INTRAMUSCULAR | Status: DC
  Administered 2016-07-02: 40 mg via INTRAVENOUS
  Filled 2016-07-02: qty 4

## 2016-07-02 MED ORDER — TRAMADOL HCL 50 MG PO TABS
50.0000 mg | ORAL_TABLET | Freq: Four times a day (QID) | ORAL | Status: DC | PRN
  Administered 2016-07-02 – 2016-07-06 (×7): 50 mg via ORAL
  Filled 2016-07-02 (×7): qty 1

## 2016-07-02 MED ORDER — UMECLIDINIUM BROMIDE 62.5 MCG/INH IN AEPB
1.0000 | INHALATION_SPRAY | Freq: Every day | RESPIRATORY_TRACT | Status: DC
  Administered 2016-07-02 – 2016-07-06 (×5): 1 via RESPIRATORY_TRACT
  Filled 2016-07-02: qty 7

## 2016-07-02 MED ORDER — POTASSIUM CHLORIDE CRYS ER 20 MEQ PO TBCR
40.0000 meq | EXTENDED_RELEASE_TABLET | Freq: Two times a day (BID) | ORAL | Status: DC
  Filled 2016-07-02: qty 2

## 2016-07-02 MED ORDER — ARFORMOTEROL TARTRATE 15 MCG/2ML IN NEBU
15.0000 ug | INHALATION_SOLUTION | Freq: Two times a day (BID) | RESPIRATORY_TRACT | Status: DC
  Administered 2016-07-02 – 2016-07-06 (×9): 15 ug via RESPIRATORY_TRACT
  Filled 2016-07-02 (×9): qty 2

## 2016-07-02 MED ORDER — ACETAMINOPHEN 325 MG PO TABS
650.0000 mg | ORAL_TABLET | Freq: Four times a day (QID) | ORAL | Status: DC | PRN
  Administered 2016-07-02 – 2016-07-04 (×3): 650 mg via ORAL
  Filled 2016-07-02 (×3): qty 2

## 2016-07-02 NOTE — ED Notes (Signed)
Called floor again to bring pt upstairs, floor says they cannot take this pt at this time because there still is no bed in the room

## 2016-07-02 NOTE — Progress Notes (Signed)
PROGRESS NOTE    Jared [name]  OMV:[id number] DOB: 1955-08-09 DOA: 07/01/2016 PCP: Jaclyn Shaggy, MD    Brief Narrative: Jared [name] is a 61 y.o. male with a past medical history significant for COPD not no home O2, CHF EF 35% who presents with 1 week worsening dyspnea, orthopnea, leg swelling.  The patient was in his usual state of health until about a week ago when he ran out of Lasix (he told others that is pulmonology also told him to stop or reduce his Lasix). Since then he has had progressive shortness of breath with exertion, wheezing and cough and then around midweek he started to have orthopnea requiring that he sleep on the couch, as well as leg swelling that progressed up to his mid back.  He came to the ER 3 days ago, was stable, and discharged home with a Lasix refill, but states today that he couldn't get this filled because the pharmacy told him he had to wait 48 hours; in the interim his symptoms worsened, so today he came to the ER again.  No fever, chills.  Chronic sputum production and wheeze, slightly worse now.      Assessment & Plan:   Active Problems:   Essential hypertension   Centrilobular emphysema (HCC)   Acute on chronic combined systolic and diastolic CHF (congestive heart failure) (HCC)   Hepatic congestion    1. Acute on chronic systolic and diastolic CHF:  -Furosemide 40 mg change to TID.  -His ARB was stopped last year when Sherryll Burger was trialed.  If his BP allows, this should be restarted -Continue BB BP soft to start entresto.  Hyperkalemia, stop potasium supplement.   2. Elevated LFTs:  Suspect congestive hepatopathy.   -Trend LFT, trending don.  -complaints of abdominal pain, he relates he was told it was from live problems. Start low dose tramadol.  -[place]; Coarsened echotexture of the liver with slight surface nodularity suggesting cirrhosis. There may be mild sympathetic thickening from of the gallbladder given trace ascites noted in the  right upper quadrant. Some of this may also be due to underdistention. No cholelithiasis. Chronic cholecystitis is not entirely excluded.   3. COPD:  -Continue Brevespi as formulary alternatives -Albuterol PRN  4. Smoking:  -Nicotine patch     DVT prophylaxis: lovenox Code Status: full code.  Family Communication: care discussed with patient  Disposition Plan: home when stable  Consultants:   none   Procedures: none   Antimicrobials: none   Subjective: Still with SOB , but improved.  Complain of chronic abdominal pain   Objective: Vitals:   07/02/16 0100 07/02/16 0202 07/02/16 0210 07/02/16 0653  BP: 131/99  (!) 125/94 131/74  Pulse: 60  (!) 111 92  Resp:   20 18  Temp:   97.7 F (36.5 C) 97.6 F (36.4 C)  TempSrc:   Oral Oral  SpO2: 96% 96% 96% 99%  Weight:   97.8 kg (215 lb 8 oz)   Height:   6\' 3"  (1.905 m)     Intake/Output Summary (Last 24 hours) at 07/02/16 0754 Last data filed at 07/02/16 0646  Gross per 24 hour  Intake              240 ml  Output              500 ml  Net             -260 ml   Filed Weights   07/01/16 1953 07/02/16 0210  Weight: 100.7 kg (222 lb 1 oz) 97.8 kg (215 lb 8 oz)    Examination:  General exam: Appears calm and comfortable  Respiratory system: Clear to auscultation. Respiratory effort normal. Cardiovascular system: S1 & S2 heard, RRR. No JVD, murmurs, rubs, gallops or clicks. No pedal edema. Gastrointestinal system: Abdomen is nondistended, soft and nontender. No organomegaly or masses felt. Normal bowel sounds heard. Central nervous system: Alert and oriented. No focal neurological deficits. Extremities: Symmetric 5 x 5 power. Skin: No rashes, lesions or ulcers     Data Reviewed: I have personally reviewed following labs and imaging studies  CBC:  Recent Labs Lab 06/28/16 1711 07/01/16 2019 07/02/16 0520  WBC 6.5 6.9 5.9  HGB 14.7 15.0 15.2  HCT 46.8 47.7 49.1  MCV 96.9 97.5 98.0  PLT 191 194 196     Basic Metabolic Panel:  Recent Labs Lab 06/28/16 1711 07/01/16 2019 07/02/16 0520  NA 143 142 141  K 4.9 4.8 5.4*  CL 108 106 104  CO2 28 30 31   GLUCOSE 121* 154* 168*  BUN 27* 23* 22*  CREATININE 1.25* 1.37* 1.28*  CALCIUM 9.0 8.8* 8.7*   GFR: Estimated Creatinine Clearance: 71.5 mL/min (by C-G formula based on SCr of 1.28 mg/dL (H)). Liver Function Tests:  Recent Labs Lab 06/28/16 2128 07/01/16 2019 07/02/16 0520  AST 144* 118* 95*  ALT 118* 132* 130*  ALKPHOS 199* 216* 214*  BILITOT 1.4* 1.1 1.1  PROT 5.8* 5.6* 5.8*  ALBUMIN 3.4* 3.3* 3.4*    Recent Labs Lab 07/01/16 2019  LIPASE 47   No results for input(s): AMMONIA in the last 168 hours. Coagulation Profile: No results for input(s): INR, PROTIME in the last 168 hours. Cardiac Enzymes: No results for input(s): CKTOTAL, CKMB, CKMBINDEX, TROPONINI in the last 168 hours. BNP (last 3 results) No results for input(s): PROBNP in the last 8760 hours. HbA1C: No results for input(s): HGBA1C in the last 72 hours. CBG: No results for input(s): GLUCAP in the last 168 hours. Lipid Profile: No results for input(s): CHOL, HDL, LDLCALC, TRIG, CHOLHDL, LDLDIRECT in the last 72 hours. Thyroid Function Tests: No results for input(s): TSH, T4TOTAL, FREET4, T3FREE, THYROIDAB in the last 72 hours. Anemia Panel: No results for input(s): VITAMINB12, FOLATE, FERRITIN, TIBC, IRON, RETICCTPCT in the last 72 hours. Sepsis Labs: No results for input(s): PROCALCITON, LATICACIDVEN in the last 168 hours.  No results found for this or any previous visit (from the past 240 hour(s)).       Radiology Studies: Dg Chest 2 View  Result Date: 07/01/2016 CLINICAL DATA:  61 year old male with chest pain and shortness of breath. History of COPD and CHF. EXAM: CHEST  2 VIEW COMPARISON:  Chest radiograph dated 06/28/2016 and CT dated 02/20/2016. FINDINGS: There is emphysematous changes of the lungs. No focal consolidation, pleural  effusion, or pneumothorax. There is persistent mild elevation of the right hemidiaphragm. Right lung base infiltrate or a subpulmonic effusion is not entirely excluded. There is stable cardiomegaly. The osseous structures are intact. IMPRESSION: Emphysema.  No focal consolidation or pneumothorax. Stable mild elevation of the right hemidiaphragm. Stable cardiomegaly.  No congestive changes or interstitial edema. Electronically Signed   By: Elgie Collard M.D.   On: 07/01/2016 21:37        Scheduled Meds: . arformoterol  15 mcg Nebulization BID  . bisoprolol  5 mg Oral Daily  . enoxaparin (LOVENOX) injection  40 mg Subcutaneous Q24H  . furosemide  40 mg Intravenous BID  .  nicotine  14 mg Transdermal Daily  . sodium chloride flush  3 mL Intravenous Q12H  . umeclidinium bromide  1 puff Inhalation Daily   Continuous Infusions:   LOS: 1 day    Time spent: 35 minutes.     Alba Cory, MD [facility] Pager [phone]  If 7PM-7AM, please contact night-coverage [email or web address] Password [facility] 07/02/2016, 7:54 AM

## 2016-07-02 NOTE — ED Notes (Signed)
Floor called, states they put another page in for a bed but still waiting

## 2016-07-02 NOTE — Care Management Note (Signed)
Case Management Note  Patient Details  Name: Jared [name] MRN: [id number] Date of Birth: 1956-05-22  Subjective/Objective:     Admitted with CHF               Action/Plan: Patient lives at home alone; PCP: Jaclyn Shaggy, MD; has private insurance with Medicaid with prescription drug coverage; he goes to the [facility] for his medication; CM will continue to follow for DCP  Expected Discharge Date:    possibly 07/05/2016              Expected Discharge Plan:  Home/Self Care  Discharge planning Services  CM Consult  Status of Service:  In process, will continue to follow  Reola Mosher [phone] 07/02/2016, 9:32 AM

## 2016-07-02 NOTE — Progress Notes (Signed)
Called service response again to re order bed for this patient.

## 2016-07-02 NOTE — Progress Notes (Signed)
   07/02/16 0210  Vitals  Temp 97.7 F (36.5 C)  Temp Source Oral  BP (!) 125/94  BP Location Right Arm  BP Method Automatic  Patient Position (if appropriate) Sitting  Pulse Rate (!) 111  Pulse Rate Source Dinamap  Resp 20  Oxygen Therapy  SpO2 96 %  O2 Device Nasal Cannula  O2 Flow Rate (L/min) 2 L/min  Height and Weight  Height 6\' 3"  (1.905 m)  Weight 97.8 kg (215 lb 8 oz)  Type of Scale Used Standing  Type of Weight Actual  BSA (Calculated - sq m) 2.27 sq meters  BMI (Calculated) 27  Weight in (lb) to have BMI = 25 199.6  Admitted pt to rm 3E19 from ED, pt alert and oriented, denied pain at this time, oriented to room, call bell placed within reach.

## 2016-07-03 ENCOUNTER — Ambulatory Visit: Payer: Medicaid Other

## 2016-07-03 LAB — HEPATIC FUNCTION PANEL
ALT: 103 U/L — ABNORMAL HIGH (ref 17–63)
AST: 56 U/L — ABNORMAL HIGH (ref 15–41)
Albumin: 3.4 g/dL — ABNORMAL LOW (ref 3.5–5.0)
Alkaline Phosphatase: 183 U/L — ABNORMAL HIGH (ref 38–126)
Bilirubin, Direct: 0.2 mg/dL (ref 0.1–0.5)
Indirect Bilirubin: 0.4 mg/dL (ref 0.3–0.9)
Total Bilirubin: 0.6 mg/dL (ref 0.3–1.2)
Total Protein: 6 g/dL — ABNORMAL LOW (ref 6.5–8.1)

## 2016-07-03 LAB — BASIC METABOLIC PANEL
Anion gap: 8 (ref 5–15)
Anion gap: 6 (ref 5–15)
BUN: 25 mg/dL — ABNORMAL HIGH (ref 6–20)
BUN: 24 mg/dL — ABNORMAL HIGH (ref 6–20)
CO2: 33 mmol/L — ABNORMAL HIGH (ref 22–32)
CO2: 36 mmol/L — ABNORMAL HIGH (ref 22–32)
Calcium: 8.9 mg/dL (ref 8.9–10.3)
Calcium: 8.6 mg/dL — ABNORMAL LOW (ref 8.9–10.3)
Chloride: 99 mmol/L — ABNORMAL LOW (ref 101–111)
Chloride: 98 mmol/L — ABNORMAL LOW (ref 101–111)
Creatinine, Ser: 1.47 mg/dL — ABNORMAL HIGH (ref 0.61–1.24)
Creatinine, Ser: 1.35 mg/dL — ABNORMAL HIGH (ref 0.61–1.24)
GFR calc Af Amer: 57 mL/min — ABNORMAL LOW (ref >60)
GFR calc Af Amer: >60 mL/min (ref >60)
GFR calc non Af Amer: 49 mL/min — ABNORMAL LOW (ref >60)
GFR calc non Af Amer: 55 mL/min — ABNORMAL LOW (ref >60)
Glucose, Bld: 126 mg/dL — ABNORMAL HIGH (ref 65–99)
Glucose, Bld: 104 mg/dL — ABNORMAL HIGH (ref 65–99)
Potassium: 5.4 mmol/L — ABNORMAL HIGH (ref 3.5–5.1)
Potassium: 4 mmol/L (ref 3.5–5.1)
Sodium: 140 mmol/L (ref 135–145)
Sodium: 140 mmol/L (ref 135–145)

## 2016-07-03 LAB — CBC
HCT: 49.7 % (ref 39.0–52.0)
Hemoglobin: 15.1 g/dL (ref 13.0–17.0)
MCH: 30.1 pg (ref 26.0–34.0)
MCHC: 30.4 g/dL (ref 30.0–36.0)
MCV: 99 fL (ref 78.0–100.0)
Platelets: 198 10*3/uL (ref 150–400)
RBC: 5.02 MIL/uL (ref 4.22–5.81)
RDW: 15.2 % (ref 11.5–15.5)
WBC: 12.6 10*3/uL — ABNORMAL HIGH (ref 4.0–10.5)

## 2016-07-03 LAB — MAGNESIUM: Magnesium: 1.7 mg/dL (ref 1.7–2.4)

## 2016-07-03 MED ORDER — FUROSEMIDE 40 MG PO TABS
40.0000 mg | ORAL_TABLET | Freq: Every day | ORAL | Status: DC
  Filled 2016-07-03: qty 1

## 2016-07-03 MED ORDER — FUROSEMIDE 10 MG/ML IJ SOLN
40.0000 mg | Freq: Three times a day (TID) | INTRAMUSCULAR | Status: DC
  Administered 2016-07-03 – 2016-07-05 (×7): 40 mg via INTRAVENOUS
  Filled 2016-07-03 (×7): qty 4

## 2016-07-03 MED ORDER — POLYETHYLENE GLYCOL 3350 17 G PO PACK
17.0000 g | PACK | Freq: Two times a day (BID) | ORAL | Status: DC
  Filled 2016-07-03 (×3): qty 1

## 2016-07-03 NOTE — Progress Notes (Signed)
Received a call from CCMD that patient went Afib rate 99, patient asymptomatic at this time, EKG done. CCMD Tech stated that patient is going in and out Afib. MD paged made aware, made orders.

## 2016-07-03 NOTE — Progress Notes (Signed)
PROGRESS NOTE    Jared [name]  ZOX:[id number] DOB: 03-07-1956 DOA: 07/01/2016 PCP: Jaclyn Shaggy, MD    Brief Narrative: Jared [name] is a 61 y.o. male with a past medical history significant for COPD not no home O2, CHF EF 35% who presents with 1 week worsening dyspnea, orthopnea, leg swelling.  The patient was in his usual state of health until about a week ago when he ran out of Lasix (he told others that is pulmonology also told him to stop or reduce his Lasix). Since then he has had progressive shortness of breath with exertion, wheezing and cough and then around midweek he started to have orthopnea requiring that he sleep on the couch, as well as leg swelling that progressed up to his mid back.  He came to the ER 3 days ago, was stable, and discharged home with a Lasix refill, but states today that he couldn't get this filled because the pharmacy told him he had to wait 48 hours; in the interim his symptoms worsened, so today he came to the ER again.  No fever, chills.  Chronic sputum production and wheeze, slightly worse now.      Assessment & Plan:   Active Problems:   Essential hypertension   Centrilobular emphysema (HCC)   Acute on chronic combined systolic and diastolic CHF (congestive heart failure) (HCC)   Hepatic congestion    1. Acute on chronic systolic and diastolic CHF:  Continue with Furosemide 40 mg  TID.  -His ARB was stopped last year when Entresto was trialed.  If his BP allows, this should be restarted -Continue BB Hyperkalemia, stop potasium supplement.  Continue to monitor renal function.  - he relates that his  dry weight is 190 pound.  -Weight; 222---215---211--  2. Elevated LFTs:  Suspect congestive hepatopathy.   -Trend LFT, trending don.  -complaints of abdominal pain, he relates he was told it was from live problems. Start low dose tramadol.  -[place]; Coarsened echotexture of the liver with slight surface nodularity suggesting cirrhosis. There may be  mild sympathetic thickening from of the gallbladder given trace ascites noted in the right upper quadrant. Some of this may also be due to underdistention. No cholelithiasis. Chronic cholecystitis is not entirely excluded.   3. COPD:  -Continue Brevespi as formulary alternatives -Albuterol PRN  4. Smoking:  -Nicotine patch     DVT prophylaxis: lovenox Code Status: full code.  Family Communication: care discussed with patient  Disposition Plan: home when stable  Consultants:   none   Procedures: none   Antimicrobials: none   Subjective: Complaints of dyspnea, still with LE edema,.  Dry weight 190 /   Objective: Vitals:   07/02/16 2112 07/02/16 2150 07/03/16 0558 07/03/16 0720  BP:  138/80 108/83   Pulse:   81 86  Resp:  18 18 17   Temp:  98.2 F (36.8 C) 97.4 F (36.3 C)   TempSrc:  Oral Oral   SpO2: 95% 98% 95% 95%  Weight:   96 kg (211 lb 11.2 oz)   Height:        Intake/Output Summary (Last 24 hours) at 07/03/16 0943 Last data filed at 07/03/16 0559  Gross per 24 hour  Intake             1542 ml  Output             2750 ml  Net            -1208 ml   Ceasar Mons  Weights   07/01/16 1953 07/02/16 0210 07/03/16 0558  Weight: 100.7 kg (222 lb 1 oz) 97.8 kg (215 lb 8 oz) 96 kg (211 lb 11.2 oz)    Examination:  General exam: Appears calm and comfortable  Respiratory system: Bilateral crackles, Respiratory effort normal. Cardiovascular system: S1 & S2 heard, RRR. No JVD, murmurs, rubs, gallops or clicks. Plus 2  Edema LE . Gastrointestinal system: Abdomen is nondistended, soft and nontender. No organomegaly or masses felt. Normal bowel sounds heard. Central nervous system: Alert and oriented. No focal neurological deficits. Extremities: Symmetric 5 x 5 power. Skin: No rashes, lesions or ulcers     Data Reviewed: I have personally reviewed following labs and imaging studies  CBC:  Recent Labs Lab 06/28/16 1711 07/01/16 2019 07/02/16 0520  07/03/16 0441  WBC 6.5 6.9 5.9 12.6*  HGB 14.7 15.0 15.2 15.1  HCT 46.8 47.7 49.1 49.7  MCV 96.9 97.5 98.0 99.0  PLT 191 194 196 198   Basic Metabolic Panel:  Recent Labs Lab 06/28/16 1711 07/01/16 2019 07/02/16 0520 07/03/16 0441  NA 143 142 141 140  K 4.9 4.8 5.4* 5.4*  CL 108 106 104 99*  CO2 28 30 31  33*  GLUCOSE 121* 154* 168* 126*  BUN 27* 23* 22* 25*  CREATININE 1.25* 1.37* 1.28* 1.47*  CALCIUM 9.0 8.8* 8.7* 8.9   GFR: Estimated Creatinine Clearance: 62.3 mL/min (by C-G formula based on SCr of 1.47 mg/dL (H)). Liver Function Tests:  Recent Labs Lab 06/28/16 2128 07/01/16 2019 07/02/16 0520 07/03/16 0441  AST 144* 118* 95* 56*  ALT 118* 132* 130* 103*  ALKPHOS 199* 216* 214* 183*  BILITOT 1.4* 1.1 1.1 0.6  PROT 5.8* 5.6* 5.8* 6.0*  ALBUMIN 3.4* 3.3* 3.4* 3.4*    Recent Labs Lab 07/01/16 2019  LIPASE 47   No results for input(s): AMMONIA in the last 168 hours. Coagulation Profile: No results for input(s): INR, PROTIME in the last 168 hours. Cardiac Enzymes: No results for input(s): CKTOTAL, CKMB, CKMBINDEX, TROPONINI in the last 168 hours. BNP (last 3 results) No results for input(s): PROBNP in the last 8760 hours. HbA1C: No results for input(s): HGBA1C in the last 72 hours. CBG: No results for input(s): GLUCAP in the last 168 hours. Lipid Profile: No results for input(s): CHOL, HDL, LDLCALC, TRIG, CHOLHDL, LDLDIRECT in the last 72 hours. Thyroid Function Tests: No results for input(s): TSH, T4TOTAL, FREET4, T3FREE, THYROIDAB in the last 72 hours. Anemia Panel: No results for input(s): VITAMINB12, FOLATE, FERRITIN, TIBC, IRON, RETICCTPCT in the last 72 hours. Sepsis Labs: No results for input(s): PROCALCITON, LATICACIDVEN in the last 168 hours.  No results found for this or any previous visit (from the past 240 hour(s)).       Radiology Studies: Dg Chest 2 View  Result Date: 07/01/2016 CLINICAL DATA:  61 year old male with chest pain  and shortness of breath. History of COPD and CHF. EXAM: CHEST  2 VIEW COMPARISON:  Chest radiograph dated 06/28/2016 and CT dated 02/20/2016. FINDINGS: There is emphysematous changes of the lungs. No focal consolidation, pleural effusion, or pneumothorax. There is persistent mild elevation of the right hemidiaphragm. Right lung base infiltrate or a subpulmonic effusion is not entirely excluded. There is stable cardiomegaly. The osseous structures are intact. IMPRESSION: Emphysema.  No focal consolidation or pneumothorax. Stable mild elevation of the right hemidiaphragm. Stable cardiomegaly.  No congestive changes or interstitial edema. Electronically Signed   By: Elgie Collard M.D.   On: 07/01/2016 21:37  Scheduled Meds: . arformoterol  15 mcg Nebulization BID  . bisoprolol  5 mg Oral Daily  . enoxaparin (LOVENOX) injection  40 mg Subcutaneous Q24H  . furosemide  40 mg Intravenous Q8H  . nicotine  14 mg Transdermal Daily  . sodium chloride flush  3 mL Intravenous Q12H  . umeclidinium bromide  1 puff Inhalation Daily   Continuous Infusions:   LOS: 2 days    Time spent: 35 minutes.     Alba Cory, MD [facility] Pager [phone]  If 7PM-7AM, please contact night-coverage [email or web address] Password [facility] 07/03/2016, 9:43 AM

## 2016-07-04 ENCOUNTER — Inpatient Hospital Stay (HOSPITAL_COMMUNITY): Payer: Self-pay

## 2016-07-04 DIAGNOSIS — I4892 Unspecified atrial flutter: Secondary | ICD-10-CM

## 2016-07-04 DIAGNOSIS — I484 Atypical atrial flutter: Secondary | ICD-10-CM

## 2016-07-04 DIAGNOSIS — I10 Essential (primary) hypertension: Secondary | ICD-10-CM

## 2016-07-04 DIAGNOSIS — I5043 Acute on chronic combined systolic (congestive) and diastolic (congestive) heart failure: Secondary | ICD-10-CM

## 2016-07-04 DIAGNOSIS — K761 Chronic passive congestion of liver: Secondary | ICD-10-CM

## 2016-07-04 LAB — BASIC METABOLIC PANEL
Anion gap: 6 (ref 5–15)
Anion gap: 8 (ref 5–15)
BUN: 20 mg/dL (ref 6–20)
BUN: 20 mg/dL (ref 6–20)
CO2: 38 mmol/L — ABNORMAL HIGH (ref 22–32)
CO2: 42 mmol/L — ABNORMAL HIGH (ref 22–32)
Calcium: 8.4 mg/dL — ABNORMAL LOW (ref 8.9–10.3)
Calcium: 8.6 mg/dL — ABNORMAL LOW (ref 8.9–10.3)
Chloride: 97 mmol/L — ABNORMAL LOW (ref 101–111)
Chloride: 93 mmol/L — ABNORMAL LOW (ref 101–111)
Creatinine, Ser: 1.33 mg/dL — ABNORMAL HIGH (ref 0.61–1.24)
Creatinine, Ser: 1.38 mg/dL — ABNORMAL HIGH (ref 0.61–1.24)
GFR calc Af Amer: >60 mL/min (ref >60)
GFR calc Af Amer: >60 mL/min (ref >60)
GFR calc non Af Amer: 56 mL/min — ABNORMAL LOW (ref >60)
GFR calc non Af Amer: 53 mL/min — ABNORMAL LOW (ref >60)
Glucose, Bld: 92 mg/dL (ref 65–99)
Glucose, Bld: 94 mg/dL (ref 65–99)
Potassium: 3.8 mmol/L (ref 3.5–5.1)
Potassium: 4 mmol/L (ref 3.5–5.1)
Sodium: 141 mmol/L (ref 135–145)
Sodium: 143 mmol/L (ref 135–145)

## 2016-07-04 LAB — ECHOCARDIOGRAM COMPLETE
Height: 75 in
Weight: 3270.4 oz

## 2016-07-04 LAB — HEPARIN LEVEL (UNFRACTIONATED): Heparin Unfractionated: 0.61 IU/mL (ref 0.30–0.70)

## 2016-07-04 LAB — CBC
HCT: 47.8 % (ref 39.0–52.0)
HCT: 48.5 % (ref 39.0–52.0)
Hemoglobin: 14.8 g/dL (ref 13.0–17.0)
Hemoglobin: 15.2 g/dL (ref 13.0–17.0)
MCH: 30.2 pg (ref 26.0–34.0)
MCH: 30.7 pg (ref 26.0–34.0)
MCHC: 31 g/dL (ref 30.0–36.0)
MCHC: 31.3 g/dL (ref 30.0–36.0)
MCV: 97.6 fL (ref 78.0–100.0)
MCV: 98 fL (ref 78.0–100.0)
Platelets: 193 10*3/uL (ref 150–400)
Platelets: 192 10*3/uL (ref 150–400)
RBC: 4.9 MIL/uL (ref 4.22–5.81)
RBC: 4.95 MIL/uL (ref 4.22–5.81)
RDW: 15.1 % (ref 11.5–15.5)
RDW: 14.9 % (ref 11.5–15.5)
WBC: 9.6 10*3/uL (ref 4.0–10.5)
WBC: 9.1 10*3/uL (ref 4.0–10.5)

## 2016-07-04 LAB — TSH: TSH: 1.268 u[IU]/mL (ref 0.350–4.500)

## 2016-07-04 LAB — PROTIME-INR
INR: 1.17
Prothrombin Time: 15 seconds (ref 11.4–15.2)

## 2016-07-04 MED ORDER — WARFARIN VIDEO: Freq: Once | Status: DC

## 2016-07-04 MED ORDER — METOPROLOL TARTRATE 5 MG/5ML IV SOLN: 5.0000 mg | Freq: Once | INTRAVENOUS | Status: DC

## 2016-07-04 MED ORDER — HEPARIN (PORCINE) IN NACL 100-0.45 UNIT/ML-% IJ SOLN
1450.0000 [IU]/h | INTRAMUSCULAR | Status: DC
  Administered 2016-07-04: 1450 [IU]/h via INTRAVENOUS
  Filled 2016-07-04 (×2): qty 250

## 2016-07-04 MED ORDER — HEPARIN BOLUS VIA INFUSION
4000.0000 [IU] | Freq: Once | INTRAVENOUS | Status: AC
  Administered 2016-07-04: 4000 [IU] via INTRAVENOUS
  Filled 2016-07-04: qty 4000

## 2016-07-04 MED ORDER — WARFARIN SODIUM 5 MG PO TABS
5.0000 mg | ORAL_TABLET | Freq: Once | ORAL | Status: AC
  Administered 2016-07-04: 5 mg via ORAL
  Filled 2016-07-04: qty 1

## 2016-07-04 MED ORDER — SODIUM CHLORIDE 0.9 % IV BOLUS (SEPSIS)
250.0000 mL | Freq: Once | INTRAVENOUS | Status: AC
  Administered 2016-07-04: 250 mL via INTRAVENOUS

## 2016-07-04 MED ORDER — CARVEDILOL 12.5 MG PO TABS
12.5000 mg | ORAL_TABLET | Freq: Two times a day (BID) | ORAL | Status: DC
  Administered 2016-07-04 – 2016-07-06 (×4): 12.5 mg via ORAL
  Filled 2016-07-04 (×4): qty 1

## 2016-07-04 MED ORDER — DILTIAZEM HCL 25 MG/5ML IV SOLN
10.0000 mg | Freq: Once | INTRAVENOUS | Status: AC
  Administered 2016-07-04: 10 mg via INTRAVENOUS
  Filled 2016-07-04: qty 5

## 2016-07-04 MED ORDER — DIGOXIN 0.25 MG/ML IJ SOLN
0.2500 mg | Freq: Once | INTRAMUSCULAR | Status: AC
  Administered 2016-07-04: 0.25 mg via INTRAVENOUS
  Filled 2016-07-04: qty 2

## 2016-07-04 MED ORDER — LEVALBUTEROL HCL 0.63 MG/3ML IN NEBU: 0.6300 mg | INHALATION_SOLUTION | Freq: Four times a day (QID) | RESPIRATORY_TRACT | Status: DC | PRN

## 2016-07-04 MED ORDER — METOPROLOL TARTRATE 5 MG/5ML IV SOLN
2.5000 mg | Freq: Once | INTRAVENOUS | Status: AC
  Administered 2016-07-04: 2.5 mg via INTRAVENOUS
  Filled 2016-07-04: qty 5

## 2016-07-04 MED ORDER — COUMADIN BOOK
Freq: Once | Status: AC
  Administered 2016-07-04: 1
  Filled 2016-07-04: qty 1

## 2016-07-04 MED ORDER — MAGNESIUM SULFATE 2 GM/50ML IV SOLN
2.0000 g | Freq: Once | INTRAVENOUS | Status: AC
  Administered 2016-07-04: 2 g via INTRAVENOUS
  Filled 2016-07-04: qty 50

## 2016-07-04 MED ORDER — LEVALBUTEROL HCL 1.25 MG/0.5ML IN NEBU: 1.2500 mg | INHALATION_SOLUTION | Freq: Four times a day (QID) | RESPIRATORY_TRACT | Status: DC | PRN

## 2016-07-04 MED ORDER — WARFARIN - PHARMACIST DOSING INPATIENT: Freq: Every day | Status: DC

## 2016-07-04 NOTE — Progress Notes (Addendum)
ANTICOAGULATION CONSULT NOTE  Pharmacy Consult for Heparin and Warfarin Indication: atrial fibrillation  Allergies  Allergen Reactions  . Lisinopril Cough    Patient Measurements: Height: 6\' 3"  (190.5 cm) Weight: 204 lb 6.4 oz (92.7 kg) (scale c) IBW/kg (Calculated) : 84.5 Heparin Dosing Weight: 92.7 kg  Vital Signs: Temp: 98.1 F (36.7 C) (01/24 0600) Temp Source: Oral (01/24 0600) BP: 120/88 (01/24 0600) Pulse Rate: 82 (01/24 0700)  Labs:  Recent Labs  07/03/16 0441 07/03/16 1647 07/04/16 0536 07/04/16 0711 07/04/16 1142  HGB 15.1  --  14.8 15.2  --   HCT 49.7  --  47.8 48.5  --   PLT 198  --  193 192  --   LABPROT  --   --   --   --  15.0  INR  --   --   --   --  1.17  CREATININE 1.47* 1.35* 1.33* 1.38*  --     Estimated Creatinine Clearance: 68.8 mL/min (by C-G formula based on SCr of 1.33 mg/dL (H)).   Medical History: Past Medical History:  Diagnosis Date  . CAD (coronary artery disease)    a. LHC 5/12:  LAD 20, pLCx 20, pRCA 40, dRCA 40, EF 35%, diff HK  //  b. Myoview 4/16: Overall Impression: High risk stress nuclear study There is no evidence of ischemia. There is severe LV dysfunction. LV Ejection Fraction: 30%. LV Wall Motion: There is global LV hypokinesis.   Marland Kitchen CAP (community acquired pneumonia) 09/2013  . Chronic combined systolic and diastolic CHF (congestive heart failure) (HCC)    a. Echo 4/16:Mild LVH, EF 40-45%, diffuse HK //  b. Echo 8/17: EF 35-40%, diffuse HK, diastolic dysfunction, aortic sclerosis, trivial MR, moderate LAE, normal RVSF, moderate RAE, mild TR, PASP 42 mmHg  . Cluster headache    "hx; haven't had one in awhile" (01/09/2016)  . COPD (chronic obstructive pulmonary disease) (HCC)    [name] 01/09/2016  . History of CVA (cerebrovascular accident)   . Hypertension   . NICM (nonischemic cardiomyopathy) (HCC)   . Tobacco abuse     Medications:  Prescriptions Prior to Admission  Medication Sig Dispense Refill Last Dose   . albuterol (PROVENTIL HFA;VENTOLIN HFA) 108 (90 Base) MCG/ACT inhaler Inhale 2 puffs into the lungs every 6 (six) hours as needed for wheezing or shortness of breath. 1 Inhaler 11 07/01/2016 at Unknown time  . bisoprolol (ZEBETA) 5 MG tablet Take 1 tablet (5 mg total) by mouth daily. 30 tablet 11 07/01/2016 at Unknown time  . Glycopyrrolate-Formoterol (BEVESPI AEROSPHERE) 9-4.8 MCG/ACT AERO Inhale 2 puffs into the lungs 2 (two) times daily.   07/01/2016 at Unknown time  . potassium chloride SA (K-DUR,KLOR-CON) 20 MEQ tablet Take 2 tablets (40 mEq total) by mouth daily. 60 tablet 0 07/01/2016 at Unknown time  . furosemide (LASIX) 20 MG tablet Take 1 tablet (20 mg total) by mouth daily. (Patient not taking: Reported on 07/01/2016) 30 tablet 0 Not Taking at Unknown time   Scheduled:  . arformoterol  15 mcg Nebulization BID  . bisoprolol  5 mg Oral Daily  . furosemide  40 mg Intravenous Q8H  . nicotine  14 mg Transdermal Daily  . polyethylene glycol  17 g Oral BID  . sodium chloride flush  3 mL Intravenous Q12H  . umeclidinium bromide  1 puff Inhalation Daily   Infusions:    Assessment: 61yo male with history of COPD presents with dyspnea, orthopnea and leg swelling. Pharmacy is consulted  to dose heparin and warfarin for atrial fibrillation. INR at baseline is 1.17.  Goal of Therapy:  INR 2-3 Heparin level 0.3-0.7 units/ml Monitor platelets by anticoagulation protocol: Yes   Plan:  Give 4000 units bolus x 1 Start heparin infusion at 1450 units/hr Warfarin 5mg  tonight x1 6h HL Daily HL/INR/CBC Continue to monitor H&H and platelets  Educate patient on warfarin  Arlean Hopping. Newman Pies, PharmD, BCPS Clinical Pharmacist [phone] 07/04/2016,7:41 AM

## 2016-07-04 NOTE — Progress Notes (Signed)
Pt's HR still 128 sinus tachy sustaining after giving 250 NSS bolus. K. Schorr was notified and ordered one dose of Metoprolol 2.5 mg IV. Will continue to monitor pt.

## 2016-07-04 NOTE — Progress Notes (Signed)
  Echocardiogram 2D Echocardiogram has been performed.  Cathie Beams 07/04/2016, 11:37 AM

## 2016-07-04 NOTE — Progress Notes (Signed)
Pt's HR still remain in 128 after giving Digoxin IV, K. Schorr was notified and ordered for 12L EKG and Cardizem IV. EKG showing Aflutter. Pt asymptomatic. Kirtland Bouchard Schorr was notified. Will monitor pt accordingly

## 2016-07-04 NOTE — Consult Note (Signed)
CARDIOLOGY CONSULT NOTE   Patient ID: Jared [name] MRN: [id number] DOB/AGE: [birth year] 61 y.o.  Admit date: 07/01/2016  Requesting Physician: Dr. Catha Gosselin Primary Physician:   Jaclyn Shaggy, MD Primary Cardiologist:  Dr. Tenny Craw Reason for Consultation:  New onset atrial flutter  HPI: Jared [name] is a 61 y.o. male with a history of combined systolic and diastolic CHF, NICM, COPD, mild nonobstructive CAD at cardiac catheterization in 2012, prior CVA, HTN, tobacco abuse and medical noncompliance who presented to [facility] on 07/01/16 with dyspnea, orthopnea and PND. Admitted for IV diuresis. Developed new onset atrial flutter and cardiology consulted.   Admitted in 12/2015 with a/c combined systolic and diastolic CHF.  He had not been taking his medications. Echocardiogram demonstrated worsening LV function with an EF of 35-40%.   Last seen in our office by Tereso Newcomer PA-C in 02/2016 and started on Entresto.   Since that time the patient lost his insurance has stopped taking Entresto. The only medications he has been taking his Bystolic and potassium. He also ran out of Lasix. He was in his usual state of health until a week prior to admission when he started to notice increasing weight gain, progressive shortness of breath, wheezing, cough, orthopnea and PND. Had lower extremity edema up to his mid back. He continues to smoke cigarettes. He says he cannot afford any of his medicines.   He was decided to [facility] on 07/01/16 and started on IV diuresis by the hospitalist team. Net -5 L however patient isn't feeling much better. He was noted to go into atrial flutter with RVR last night and cardiology is consulted. This is a new diagnosis. He is asymptomatic with this. He denies current chest pain or dyspnea. No palpitations. No dizziness or syncope. No history of bleeding. He denies a history of CVA however this is listed in his medical chart.  Still feeling SOB with PND/orthonpnea and LE  edema. No chest pain currently but does get some chest tightness at home with rest and exertion.    Past Medical History:  Diagnosis Date  . CAD (coronary artery disease)    a. LHC 5/12:  LAD 20, pLCx 20, pRCA 40, dRCA 40, EF 35%, diff HK  //  b. Myoview 4/16: Overall Impression: High risk stress nuclear study There is no evidence of ischemia. There is severe LV dysfunction. LV Ejection Fraction: 30%. LV Wall Motion: There is global LV hypokinesis.   Marland Kitchen CAP (community acquired pneumonia) 09/2013  . Chronic combined systolic and diastolic CHF (congestive heart failure) (HCC)    a. Echo 4/16:Mild LVH, EF 40-45%, diffuse HK //  b. Echo 8/17: EF 35-40%, diffuse HK, diastolic dysfunction, aortic sclerosis, trivial MR, moderate LAE, normal RVSF, moderate RAE, mild TR, PASP 42 mmHg  . Cluster headache    "hx; haven't had one in awhile" (01/09/2016)  . COPD (chronic obstructive pulmonary disease) (HCC)    [name] 01/09/2016  . History of CVA (cerebrovascular accident)   . Hypertension   . NICM (nonischemic cardiomyopathy) (HCC)   . Tobacco abuse      Past Surgical History:  Procedure Laterality Date  . CARDIAC CATHETERIZATION  10/2010   LM normal, LAD with 20% irregularities, LCX with 20%, RCA with 40% prox and 40% distal - EF of 35%  . COLONOSCOPY W/ BIOPSIES AND POLYPECTOMY    . VIDEO BRONCHOSCOPY Bilateral 05/08/2016   Procedure: VIDEO BRONCHOSCOPY WITH FLUORO;  Surgeon: Oretha Milch, MD;  Location: [facility] ENDOSCOPY;  Service: Cardiopulmonary;  Laterality: Bilateral;    Allergies  Allergen Reactions  . Lisinopril Cough    I have reviewed the patient's current medications . arformoterol  15 mcg Nebulization BID  . bisoprolol  5 mg Oral Daily  . furosemide  40 mg Intravenous Q8H  . magnesium sulfate 1 - 4 g bolus IVPB  2 g Intravenous Once  . nicotine  14 mg Transdermal Daily  . polyethylene glycol  17 g Oral BID  . sodium chloride flush  3 mL Intravenous Q12H  . umeclidinium bromide   1 puff Inhalation Daily   . heparin 1,450 Units/hr (07/04/16 0745)   acetaminophen **OR** acetaminophen, levalbuterol, traMADol  Prior to Admission medications   Medication Sig Start Date End Date Taking? Authorizing Provider  albuterol (PROVENTIL HFA;VENTOLIN HFA) 108 (90 Base) MCG/ACT inhaler Inhale 2 puffs into the lungs every 6 (six) hours as needed for wheezing or shortness of breath. 06/26/16  Yes Nyoka Cowden, MD  bisoprolol (ZEBETA) 5 MG tablet Take 1 tablet (5 mg total) by mouth daily. 06/26/16  Yes Nyoka Cowden, MD  Glycopyrrolate-Formoterol (BEVESPI AEROSPHERE) 9-4.8 MCG/ACT AERO Inhale 2 puffs into the lungs 2 (two) times daily.   Yes Historical Provider, MD  potassium chloride SA (K-DUR,KLOR-CON) 20 MEQ tablet Take 2 tablets (40 mEq total) by mouth daily. 06/26/16  Yes Nyoka Cowden, MD  furosemide (LASIX) 20 MG tablet Take 1 tablet (20 mg total) by mouth daily. Patient not taking: Reported on 07/01/2016 06/28/16   Everlene Farrier, PA-C     Social History   Social History  . Marital status: Single    Spouse name: N/A  . Number of children: N/A  . Years of education: N/A   Occupational History  . Not on file.   Social History Main Topics  . Smoking status: Current Every Day Smoker    Packs/day: 0.50    Years: 42.00    Types: Cigarettes  . Smokeless tobacco: Never Used  . Alcohol use 3.6 oz/week    6 Glasses of wine per week     Comment: weekends  . Drug use: No     Comment: "nothing in 20 years"  . Sexual activity: Not Currently   Other Topics Concern  . Not on file   Social History Narrative   unemployed       Family Status  Relation Status  . Mother Deceased  . Father Deceased  . Sister   . Brother    Family History  Problem Relation Age of Onset  . Cancer Mother   . Heart disease Mother   . Diabetes Mother   . Cancer Father   . Diabetes Father   . Diabetes Sister   . Diabetes Brother     ROS:  Full 14 point review of systems complete and  found to be negative unless listed above.  Physical Exam: Blood pressure 106/68, pulse 74, temperature 98 F (36.7 C), temperature source Oral, resp. rate 18, height 6\' 3"  (1.905 m), weight 204 lb 6.4 oz (92.7 kg), SpO2 99 %.  General: Well developed, well nourished, male in no acute distress Head: Eyes PERRLA, No xanthomas.   Normocephalic and atraumatic, oropharynx without edema or exudate.  Lungs: decreased breath sounds at bases Heart: S1 S2, no rub/gallop, Heart irregular and tachy with S1, S2 no murmur. pulses are 2+ extrem.   Neck: No carotid bruits. No lymphadenopathy.  JVD. Abdomen: Bowel sounds present, abdomen soft and non-tender without masses or hernias  noted. Msk:  No spine or cva tenderness. No weakness, no joint deformities or effusions. Extremities: No clubbing or cyanosis. 2+ LE edema.  Neuro: Alert and oriented X 3. No focal deficits noted. Psych:  Good affect, responds appropriately Skin: No rashes or lesions noted.  Labs:   Lab Results  Component Value Date   WBC 9.1 07/04/2016   HGB 15.2 07/04/2016   HCT 48.5 07/04/2016   MCV 98.0 07/04/2016   PLT 192 07/04/2016   No results for input(s): INR in the last 72 hours.  Recent Labs Lab 07/03/16 0441  07/04/16 0711  NA 140  < > 143  K 5.4*  < > 4.0  CL 99*  < > 93*  CO2 33*  < > 42*  BUN 25*  < > 20  CREATININE 1.47*  < > 1.38*  CALCIUM 8.9  < > 8.6*  PROT 6.0*  --   --   BILITOT 0.6  --   --   ALKPHOS 183*  --   --   ALT 103*  --   --   AST 56*  --   --   GLUCOSE 126*  < > 94  ALBUMIN 3.4*  --   --   < > = values in this interval not displayed. Magnesium  Date Value Ref Range Status  07/03/2016 1.7 1.7 - 2.4 mg/dL Final   No results for input(s): CKTOTAL, CKMB, TROPONINI in the last 72 hours.  Recent Labs  07/01/16 2022  TROPIPOC 0.03   Pro B Natriuretic peptide (BNP)  Date/Time Value Ref Range Status  11/18/2013 08:29 AM 330.0 (H) 0.0 - 100.0 pg/mL Final  10/05/2013 10:45 AM 1,242.0 (H) 0  - 125 pg/mL Final   Lab Results  Component Value Date   CHOL 147 01/11/2016   HDL 53 01/11/2016   LDLCALC 80 01/11/2016   TRIG 69 01/11/2016   Lab Results  Component Value Date   DDIMER 1.55 (H) 02/20/2016   Lipase  Date/Time Value Ref Range Status  07/01/2016 08:19 PM 47 11 - 51 U/L Final    Echo 01/10/16 EF 35-40%, diffuse HK, diastolic dysfunction, aortic sclerosis, trivial MR, moderate LAE, normal RVSF, moderate RAE, mild TR, PASP 42 mmHg  Myoview 4/16 Overall Impression: High risk stress nuclear study There is no evidence of ischemia. There is severe LV dysfunction. LV Ejection Fraction: 30%. LV Wall Motion: There is global LV hypokinesis.   Echo 4/16 Mild LVH, EF 40-45%, diffuse HK  Cardiac cath(10/11/2010) Done for [facility]) Known cardiomyopathy prior. LM normal  LAD 20% irregularities LCX 20% proximal RCA: Dominant; 40% proximal, 40% distal LVEF 35% with diffuse hypokinesis.  ECG:  Atrial flutter with 2:1 A-V conduction LAD HR 128  Radiology:  No results found.  ASSESSMENT AND PLAN:    Active Problems:   Essential hypertension   Centrilobular emphysema (HCC)   Acute on chronic combined systolic and diastolic CHF (congestive heart failure) (HCC)   Hepatic congestion  New onset atrial flutter: Heart rate remains mildly elevated around 111.  He was started on heparin for CHADSVASC of at least 5 ( CHF, HTN, vascular disease, CVA). Not sure he is a good anticoagulation candidate given his history of noncompliance. He would likely not be able to afford a DOAC and would be a poor candidate for Coumadin. If not OAC would start daily ASA. Currently on bisoprolol 5 mg daily given his severe COPD. He was also given IV digoxin for better rate  control, but nothing scheduled. Repeat 2D ECHO pending.   Acute on chronic systolic heart failure: EF 35-40% by echo 01/2016. Felt to be NICM, had mild non obst CAD by cath in 2012. This  admission, BNP mildly elevated to 728.8. Chest x-ray with no overt pulmonary vascular congestion. Net -5 L and weight down 18 lbs (222--> 204) on IV Lasix 40 mg TID. Still with some lower extremity edema, continue IV Lasix per internal medicine. Continue BB and would add ARB back when diuresed (stopped Entresto as an outpatient). Repeat 2D ECHO pending.  Elevated LFTs: Likely secondary to congestive hepatopathy. Improving with diuresis  HTN: BPs have been well controlled, soft this AM at 106/68  Tobacco abuse: counseled on cessation. He has severe COPD  Medical non compliance: he has poor insight into his health conditions and will have poor outcomes if he continues to be non complaint.   Signed: Cline Crock, PA-C 07/04/2016 9:16 AM  Pager [phone]  Co-Sign MD  Patient examined chart reviewed. Biggest issue is lack of insurance and non compliance with meds. Exam with  JVP elevation flutter rate 90-110 no murmur plus one edema. Discussed issues of med compliance and need For anticoagulation. He stopped entresto due to cost and would stick with generic diuretic coreg and ARB He  Says he is willing to take coumadin and f/u with our clinic will start Told him if he did not f/u with lab testing we Would have to stop  [employer]

## 2016-07-04 NOTE — Progress Notes (Signed)
Pt's HR in 128 sinus tachy sustaining, pt is at rest and asleep. K. Schorr was notified and ordered for a bolus of Normal saline 250cc. Will continue to monitor pt

## 2016-07-04 NOTE — Progress Notes (Signed)
PROGRESS NOTE    Jared [name]  ZOX:[id number] DOB: 10/20/1955 DOA: 07/01/2016 PCP: Jaclyn Shaggy, MD   Chief Complaint  Patient presents with  . Shortness of Breath  . Chest Pain    Brief Narrative:  HPI on 07/01/2016 by Dr. Joen Laura Renel Ende is a 61 y.o. male with a past medical history significant for COPD not no home O2, CHF EF 35% who presents with 1 week worsening dyspnea, orthopnea, leg swelling. The patient was in his usual state of health until about a week ago when he ran out of Lasix (he told others that is pulmonology also told him to stop or reduce his Lasix). Since then he has had progressive shortness of breath with exertion, wheezing and cough and then around midweek he started to have orthopnea requiring that he sleep on the couch, as well as leg swelling that progressed up to his mid back.  He came to the ER 3 days ago, was stable, and discharged home with a Lasix refill, but states today that he couldn't get this filled because the pharmacy told him he had to wait 48 hours; in the interim his symptoms worsened, so today he came to the ER again.  No fever, chills.  Chronic sputum production and wheeze, slightly worse now.   Assessment & Plan   Acute on chronic systolic and diastolic CHF -Continue diuresis, IV lasix 40mg  TID -Patient discontinued Entresto as he could not afford it. -Continue bisoprolol -His ARB was stopped last year when Sherryll Burger was trialed. If his BP allows, this should be restarted -Weight is down to 204lbs   New onset Atrial Flutter -Patient developed HR in the 130s overnight. Was given IV digoxin and Cardizem.  -Placed on heparin and bridging to coumadin -Echocardiogram ordered: EF 30-35%, small pericardial effusion with dilated IVC -TSH 1.268 -Cardiology consulted and appreciated -Not sure if patient is a candidate for anticoagulation given his history of noncompliance -Continue bisoprolol  Elevated LFTs -likely secondary to  congestive hepatopathy.  -LFTs trending downward -complaints of abdominal pain, he relates he was told it was from live problems. Start low dose tramadol.  -[place]: Coarsened echotexture of the liver with slight surface nodularity suggesting cirrhosis. There may be mild sympathetic thickening from of the gallbladder given trace ascites noted in the right upper quadrant. Some of this may also be due to underdistention. No cholelithiasis. Chronic cholecystitis is not entirely excluded.  COPD -Continue nebs -Currently stable  Tobacco abuse -Smoking cessation discussed -Continue nicotine patch  DVT Prophylaxis  Lovenox  Code Status: Ful  Family Communication: None at bedside  Disposition Plan: Admitted. Continue treatment of CHF and A. Flutter.  Consultants Cardiology  Procedures  Echocardiogram  Antibiotics   Anti-infectives    None      Subjective:   [facility] seen and examined today.  Continues to complain of shortness of breath and leg swelling. Does not feel any different from admission.  Currently denies chest pain.  States he has had an irregular heart rhythm in the past and sees Dr. Tenny Craw.  Cannot remember what medications he was placed on.  Currently denies abdominal pain, N/V/D/C, dizziness, headache. Objective:   Vitals:   07/04/16 0609 07/04/16 0700 07/04/16 0807 07/04/16 0834  BP:    106/68  Pulse:  82  74  Resp:    18  Temp:    98 F (36.7 C)  TempSrc:    Oral  SpO2:   94% 99%  Weight: 92.7 kg (204 lb  6.4 oz)     Height:        Intake/Output Summary (Last 24 hours) at 07/04/16 1344 Last data filed at 07/04/16 1212  Gross per 24 hour  Intake             1264 ml  Output             5890 ml  Net            -4626 ml   Filed Weights   07/02/16 0210 07/03/16 0558 07/04/16 0609  Weight: 97.8 kg (215 lb 8 oz) 96 kg (211 lb 11.2 oz) 92.7 kg (204 lb 6.4 oz)    Exam  General: Well developed, well nourished, NAD, appears stated age  HEENT: NCAT,  PERRLA, EOMI, Anicteic Sclera, mucous membranes moist.   Neck: Supple, no JVD, no masses  Cardiovascular: S1 S2 auscultated, irregularly irregular  Respiratory: Diminished breath sounds   Abdomen: Soft, nontender, nondistended, + bowel sounds  Extremities: warm dry without cyanosis clubbing. B/L 2+LE edema   Neuro: AAOx3, nonfocal  Psych: Normal affect and demeanor    Data Reviewed: I have personally reviewed following labs and imaging studies  CBC:  Recent Labs Lab 07/01/16 2019 07/02/16 0520 07/03/16 0441 07/04/16 0536 07/04/16 0711  WBC 6.9 5.9 12.6* 9.6 9.1  HGB 15.0 15.2 15.1 14.8 15.2  HCT 47.7 49.1 49.7 47.8 48.5  MCV 97.5 98.0 99.0 97.6 98.0  PLT 194 196 198 193 192   Basic Metabolic Panel:  Recent Labs Lab 07/02/16 0520 07/03/16 0441 07/03/16 1647 07/04/16 0536 07/04/16 0711  NA 141 140 140 141 143  K 5.4* 5.4* 4.0 3.8 4.0  CL 104 99* 98* 97* 93*  CO2 31 33* 36* 38* 42*  GLUCOSE 168* 126* 104* 92 94  BUN 22* 25* 24* 20 20  CREATININE 1.28* 1.47* 1.35* 1.33* 1.38*  CALCIUM 8.7* 8.9 8.6* 8.4* 8.6*  MG  --   --  1.7  --   --    GFR: Estimated Creatinine Clearance: 66.3 mL/min (by C-G formula based on SCr of 1.38 mg/dL (H)). Liver Function Tests:  Recent Labs Lab 06/28/16 2128 07/01/16 2019 07/02/16 0520 07/03/16 0441  AST 144* 118* 95* 56*  ALT 118* 132* 130* 103*  ALKPHOS 199* 216* 214* 183*  BILITOT 1.4* 1.1 1.1 0.6  PROT 5.8* 5.6* 5.8* 6.0*  ALBUMIN 3.4* 3.3* 3.4* 3.4*    Recent Labs Lab 07/01/16 2019  LIPASE 47   No results for input(s): AMMONIA in the last 168 hours. Coagulation Profile:  Recent Labs Lab 07/04/16 1142  INR 1.17   Cardiac Enzymes: No results for input(s): CKTOTAL, CKMB, CKMBINDEX, TROPONINI in the last 168 hours. BNP (last 3 results) No results for input(s): PROBNP in the last 8760 hours. HbA1C: No results for input(s): HGBA1C in the last 72 hours. CBG: No results for input(s): GLUCAP in the last 168  hours. Lipid Profile: No results for input(s): CHOL, HDL, LDLCALC, TRIG, CHOLHDL, LDLDIRECT in the last 72 hours. Thyroid Function Tests:  Recent Labs  07/04/16 0820  TSH 1.268   Anemia Panel: No results for input(s): VITAMINB12, FOLATE, FERRITIN, TIBC, IRON, RETICCTPCT in the last 72 hours. Urine analysis:    Component Value Date/Time   COLORURINE AMBER (A) 07/01/2016 2022   APPEARANCEUR CLEAR 07/01/2016 2022   LABSPEC 1.025 07/01/2016 2022   PHURINE 5.0 07/01/2016 2022   GLUCOSEU NEGATIVE 07/01/2016 2022   HGBUR NEGATIVE 07/01/2016 2022   BILIRUBINUR NEGATIVE 07/01/2016 2022  KETONESUR NEGATIVE 07/01/2016 2022   PROTEINUR 100 (A) 07/01/2016 2022   NITRITE NEGATIVE 07/01/2016 2022   LEUKOCYTESUR NEGATIVE 07/01/2016 2022   Sepsis Labs: @LABRCNTIP (procalcitonin:4,lacticidven:4)  )No results found for this or any previous visit (from the past 240 hour(s)).    Radiology Studies: No results found.   Scheduled Meds: . arformoterol  15 mcg Nebulization BID  . carvedilol  12.5 mg Oral BID WC  . coumadin book   Does not apply Once  . furosemide  40 mg Intravenous Q8H  . nicotine  14 mg Transdermal Daily  . polyethylene glycol  17 g Oral BID  . sodium chloride flush  3 mL Intravenous Q12H  . umeclidinium bromide  1 puff Inhalation Daily  . warfarin  5 mg Oral ONCE-1800  . warfarin   Does not apply Once  . Warfarin - Pharmacist Dosing Inpatient   Does not apply q1800   Continuous Infusions: . heparin 1,450 Units/hr (07/04/16 0745)     LOS: 3 days   Time Spent in minutes   30 minutes  Davian Wollenberg D.O. on 07/04/2016 at 1:44 PM  Between 7am to 7pm - Pager - [phone]  After 7pm go to [email or web address] - password [email or web address]  And look for the night coverage person covering for me after hours  [facility] Office  [phone]

## 2016-07-04 NOTE — Progress Notes (Signed)
Pt's HR still the same 127-128 after giving Metoprolol 2.5mg  IV. K. Schorr was notified and ordered for Digoxin 0.25mg  IV. Will continue to monitor pt

## 2016-07-04 NOTE — Progress Notes (Signed)
ANTICOAGULATION CONSULT NOTE  Pharmacy Consult for Heparin and warfarin Indication: atrial fibrillation  Allergies  Allergen Reactions  . Lisinopril Cough    Patient Measurements: Height: 6\' 3"  (190.5 cm) Weight: 204 lb 6.4 oz (92.7 kg) (scale c) IBW/kg (Calculated) : 84.5 Heparin Dosing Weight: 92.7 kg  Vital Signs: Temp: 98 F (36.7 C) (01/24 0834) Temp Source: Oral (01/24 0834) BP: 106/68 (01/24 0834) Pulse Rate: 74 (01/24 0834)  Labs:  Recent Labs  07/03/16 0441 07/03/16 1647 07/04/16 0536 07/04/16 0711 07/04/16 1142 07/04/16 1549  HGB 15.1  --  14.8 15.2  --   --   HCT 49.7  --  47.8 48.5  --   --   PLT 198  --  193 192  --   --   LABPROT  --   --   --   --  15.0  --   INR  --   --   --   --  1.17  --   HEPARINUNFRC  --   --   --   --   --  0.61  CREATININE 1.47* 1.35* 1.33* 1.38*  --   --     Estimated Creatinine Clearance: 66.3 mL/min (by C-G formula based on SCr of 1.38 mg/dL (H)).   Medical History: Past Medical History:  Diagnosis Date  . CAD (coronary artery disease)    a. LHC 5/12:  LAD 20, pLCx 20, pRCA 40, dRCA 40, EF 35%, diff HK  //  b. Myoview 4/16: Overall Impression: High risk stress nuclear study There is no evidence of ischemia. There is severe LV dysfunction. LV Ejection Fraction: 30%. LV Wall Motion: There is global LV hypokinesis.   Marland Kitchen CAP (community acquired pneumonia) 09/2013  . Chronic combined systolic and diastolic CHF (congestive heart failure) (HCC)    a. Echo 4/16:Mild LVH, EF 40-45%, diffuse HK //  b. Echo 8/17: EF 35-40%, diffuse HK, diastolic dysfunction, aortic sclerosis, trivial MR, moderate LAE, normal RVSF, moderate RAE, mild TR, PASP 42 mmHg  . Cluster headache    "hx; haven't had one in awhile" (01/09/2016)  . COPD (chronic obstructive pulmonary disease) (HCC)    [name] 01/09/2016  . History of CVA (cerebrovascular accident)   . Hypertension   . NICM (nonischemic cardiomyopathy) (HCC)   . Tobacco abuse      Medications:  Prescriptions Prior to Admission  Medication Sig Dispense Refill Last Dose  . albuterol (PROVENTIL HFA;VENTOLIN HFA) 108 (90 Base) MCG/ACT inhaler Inhale 2 puffs into the lungs every 6 (six) hours as needed for wheezing or shortness of breath. 1 Inhaler 11 07/01/2016 at Unknown time  . bisoprolol (ZEBETA) 5 MG tablet Take 1 tablet (5 mg total) by mouth daily. 30 tablet 11 07/01/2016 at Unknown time  . Glycopyrrolate-Formoterol (BEVESPI AEROSPHERE) 9-4.8 MCG/ACT AERO Inhale 2 puffs into the lungs 2 (two) times daily.   07/01/2016 at Unknown time  . potassium chloride SA (K-DUR,KLOR-CON) 20 MEQ tablet Take 2 tablets (40 mEq total) by mouth daily. 60 tablet 0 07/01/2016 at Unknown time  . furosemide (LASIX) 20 MG tablet Take 1 tablet (20 mg total) by mouth daily. (Patient not taking: Reported on 07/01/2016) 30 tablet 0 Not Taking at Unknown time   Scheduled:  . arformoterol  15 mcg Nebulization BID  . carvedilol  12.5 mg Oral BID WC  . coumadin book   Does not apply Once  . furosemide  40 mg Intravenous Q8H  . nicotine  14 mg Transdermal Daily  .  polyethylene glycol  17 g Oral BID  . sodium chloride flush  3 mL Intravenous Q12H  . umeclidinium bromide  1 puff Inhalation Daily  . warfarin  5 mg Oral ONCE-1800  . warfarin   Does not apply Once  . Warfarin - Pharmacist Dosing Inpatient   Does not apply q1800   Infusions:  . heparin 1,450 Units/hr (07/04/16 0745)    Assessment: 60yo male with history of COPD presents with dyspnea, orthopnea and leg swelling. Pharmacy is consulted to dose heparin and warfarin for atrial fibrillation. INR at baseline is 1.17. CBC wnl.  Initial heparin level therapeutic (0.61) on 1450 units/h. No bleed documented.  Goal of Therapy:  INR 2-3 Heparin level 0.3-0.7 units/ml Monitor platelets by anticoagulation protocol: Yes   Plan:  Continue heparin infusion at 1450 units/hr Warfarin 5mg  tonight x1 Daily heparin level/INR/CBC Monitor for  s/sx bleeding   Babs Bertin, PharmD, BCPS Clinical Pharmacist 07/04/2016 5:06 PM

## 2016-07-05 LAB — CBC
HCT: 47.6 % (ref 39.0–52.0)
Hemoglobin: 15.1 g/dL (ref 13.0–17.0)
MCH: 30.5 pg (ref 26.0–34.0)
MCHC: 31.7 g/dL (ref 30.0–36.0)
MCV: 96.2 fL (ref 78.0–100.0)
Platelets: 209 10*3/uL (ref 150–400)
RBC: 4.95 MIL/uL (ref 4.22–5.81)
RDW: 14.5 % (ref 11.5–15.5)
WBC: 8.3 10*3/uL (ref 4.0–10.5)

## 2016-07-05 LAB — BASIC METABOLIC PANEL
Anion gap: 7 (ref 5–15)
BUN: 13 mg/dL (ref 6–20)
CO2: 40 mmol/L — ABNORMAL HIGH (ref 22–32)
Calcium: 8.5 mg/dL — ABNORMAL LOW (ref 8.9–10.3)
Chloride: 93 mmol/L — ABNORMAL LOW (ref 101–111)
Creatinine, Ser: 1.08 mg/dL (ref 0.61–1.24)
GFR calc Af Amer: >60 mL/min (ref >60)
GFR calc non Af Amer: >60 mL/min (ref >60)
Glucose, Bld: 111 mg/dL — ABNORMAL HIGH (ref 65–99)
Potassium: 3.5 mmol/L (ref 3.5–5.1)
Sodium: 140 mmol/L (ref 135–145)

## 2016-07-05 LAB — HEPARIN LEVEL (UNFRACTIONATED): Heparin Unfractionated: 0.51 IU/mL (ref 0.30–0.70)

## 2016-07-05 LAB — PROTIME-INR
INR: 1.18
Prothrombin Time: 15.1 seconds (ref 11.4–15.2)

## 2016-07-05 MED ORDER — LOSARTAN POTASSIUM 25 MG PO TABS
25.0000 mg | ORAL_TABLET | Freq: Every day | ORAL | Status: DC
  Administered 2016-07-05 – 2016-07-06 (×2): 25 mg via ORAL
  Filled 2016-07-05 (×2): qty 1

## 2016-07-05 MED ORDER — FUROSEMIDE 10 MG/ML IJ SOLN
40.0000 mg | Freq: Two times a day (BID) | INTRAMUSCULAR | Status: DC
  Administered 2016-07-05 – 2016-07-06 (×2): 40 mg via INTRAVENOUS
  Filled 2016-07-05 (×2): qty 4

## 2016-07-05 MED ORDER — ENOXAPARIN SODIUM 150 MG/ML ~~LOC~~ SOLN
1.5000 mg/kg | SUBCUTANEOUS | Status: DC
  Administered 2016-07-05 – 2016-07-06 (×2): 135 mg via SUBCUTANEOUS
  Filled 2016-07-05 (×2): qty 0.89

## 2016-07-05 MED ORDER — POTASSIUM CHLORIDE CRYS ER 20 MEQ PO TBCR
40.0000 meq | EXTENDED_RELEASE_TABLET | Freq: Once | ORAL | Status: AC
  Administered 2016-07-05: 40 meq via ORAL

## 2016-07-05 MED ORDER — WARFARIN SODIUM 5 MG PO TABS
5.0000 mg | ORAL_TABLET | Freq: Once | ORAL | Status: AC
Start: 2016-07-05 — End: 2016-07-05
  Administered 2016-07-05: 5 mg via ORAL
  Filled 2016-07-05 (×2): qty 1

## 2016-07-05 NOTE — Progress Notes (Signed)
Pt refused bed alarm on. Will continue to do hourly rounding. 

## 2016-07-05 NOTE — Discharge Instructions (Addendum)
Atrial Flutter  Atrial flutter is a type of abnormal heart rhythm (arrhythmia). In atrial flutter, the heartbeat is fast but regular. There are two types of atrial flutter:  · Paroxysmal atrial flutter. This type starts suddenly. It usually stops on its own soon after it starts.  · Permanent atrial flutter. This type does not go away.    What are the causes?  This condition may be caused by:  · A heart condition or problem, such as:  ? A heart attack.  ? Heart failure.  ? A heart valve problem.  · A lung problem, such as:  ? A blood clot in the lungs (pulmonary embolism, or PE).  ? Chronic obstructive pulmonary disease.  · Poorly controlled high blood pressure (hypertension).  · Hyperthyroidism.  · Caffeine.  · Some decongestant cold medicines.  · Low levels of minerals called electrolytes in the blood.  · Cocaine.    What increases the risk?  This condition is more likely to develop in:  · Elderly adults.  · Men.    What are the signs or symptoms?  Symptoms of this condition include:  · A feeling that your heart is pounding or racing (palpitations).  · Shortness of breath.  · Chest pain.  · Feeling light-headed.  · Dizziness.  · Fainting.    How is this diagnosed?  This condition may be diagnosed with tests, including:  · An electrocardiogram (ECG). This is a painless test that records electrical signals in the heart.  · Holter monitoring. For this test, you wear a device that records your heartbeat for 1-2 days.  · Cardiac event monitoring. For this test, you wear a device that records your heartbeat for up to 30 days.  · An echocardiogram. This is a painless test that uses sound waves to make a picture of your heart.  · Stress test. This test records your heartbeat while you exercise.  · Blood tests.    How is this treated?  This condition may be treated with:  · Treatment of any underlying conditions.  · Medicine to make your heart beat more slowly.  · Medicine to keep the condition from coming back.  · A  procedure to keep the condition under control. Some procedures to do this include:  ? Cardioversion. During this procedure, medicines or an electrical shock are given to make the heart beat normally.  ? Ablation. During this procedure, the heart tissue that is causing the problem is destroyed. This procedure may be done if atrial flutter lasts a long time or happens often.    Follow these instructions at home:  · Take over-the-counter and prescription medicines only as told by your health care provider.  · Do not take any new medicines without talking to your health care provider.  · Do not use tobacco products, including cigarettes, chewing tobacco, or e-cigarettes. If you need help quitting, ask your health care provider.  · Limit alcohol intake to no more than 1 drink per day for nonpregnant women and 2 drinks per day for men. One drink equals 12 oz of beer, 5 oz of wine, or 1½ oz of hard liquor.  · Try to reduce any stress. Stress can make your symptoms worse.  Contact a health care provider if:  · Your symptoms get worse.  Get help right away if:  · You are dizzy.  · You feel like fainting or you faint.  · You have shortness of breath.  · You feel pain   10/05/2015 Document Reviewed: 12/10/2014 Elsevier Interactive Patient Education  2017 [employer] Inc[employer]   Heart Failure Heart failure means your heart has trouble pumping blood. This makes it hard for your body to work well. Heart failure is usually a long-term (chronic) condition. You must take good care of yourself and follow your doctor's treatment plan. HOME  CARE  Take your heart medicine as told by your doctor.  Do not stop taking medicine unless your doctor tells you to.  Do not skip any dose of medicine.  Refill your medicines before they run out.  Take other medicines only as told by your doctor or pharmacist.  Stay active if told by your doctor. The elderly and people with severe heart failure should talk with a doctor about physical activity.  Eat heart-healthy foods. Choose foods that are without trans fat and are low in saturated fat, cholesterol, and salt (sodium). This includes fresh or frozen fruits and vegetables, fish, lean meats, fat-free or low-fat dairy foods, whole grains, and high-fiber foods. Lentils and dried peas and beans (legumes) are also good choices.  Limit salt if told by your doctor.  Cook in a healthy way. Roast, grill, broil, bake, poach, steam, or stir-fry foods.  Limit fluids as told by your doctor.  Weigh yourself every morning. Do this after you pee (urinate) and before you eat breakfast. Write down your weight to give to your doctor.  Take your blood pressure and write it down if your doctor tells you to.  Ask your doctor how to check your pulse. Check your pulse as told.  Lose weight if told by your doctor.  Stop smoking or chewing tobacco. Do not use gum or patches that help you quit without your doctor's approval.  Schedule and go to doctor visits as told.  Nonpregnant women should have no more than 1 drink a day. Men should have no more than 2 drinks a day. Talk to your doctor about drinking alcohol.  Stop illegal drug use.  Stay current with shots (immunizations).  Manage your health conditions as told by your doctor.  Learn to manage your stress.  Rest when you are tired.  If it is really hot outside:  Avoid intense activities.  Use air conditioning or fans, or get in a cooler place.  Avoid caffeine and alcohol.  Wear loose-fitting, lightweight, and light-colored  clothing.  If it is really cold outside:  Avoid intense activities.  Layer your clothing.  Wear mittens or gloves, a hat, and a scarf when going outside.  Avoid alcohol.  Learn about heart failure and get support as needed.  Get help to maintain or improve your quality of life and your ability to care for yourself as needed. GET HELP IF:   You gain weight quickly.  You are more short of breath than usual.  You cannot do your normal activities.  You tire easily.  You cough more than normal, especially with activity.  You have any or more puffiness (swelling) in areas such as your hands, feet, ankles, or belly (abdomen).  You cannot sleep because it is hard to breathe.  You feel like your heart is beating fast (palpitations).  You get dizzy or light-headed when you stand up. GET HELP RIGHT AWAY IF:   You have trouble breathing.  There is a change in mental status, such as becoming less alert or not being able to focus.  You have chest pain or discomfort.  You faint. MAKE SURE YOU:   Understand  these instructions.  Will watch your condition.  Will get help right away if you are not doing well or get worse. This information is not intended to replace advice given to you by your health care provider. Make sure you discuss any questions you have with your health care provider. Document Released: 03/06/2008 Document Revised: 06/18/2014 Document Reviewed: 07/14/2012 Elsevier Interactive Patient Education  2017 [employer]. Information on my medicine - Coumadin   (Warfarin)  This medication education was reviewed with me or my healthcare representative as part of my discharge preparation.    Why was Coumadin prescribed for you? Coumadin was prescribed for you because you have a blood clot or a medical condition that can cause an increased risk of forming blood clots. Blood clots can cause serious health problems by blocking the flow of blood to the heart, lung, or  brain. Coumadin can prevent harmful blood clots from forming. As a reminder your indication for Coumadin is:   Stroke Prevention Because Of Atrial Fibrillation  What test will check on my response to Coumadin? While on Coumadin (warfarin) you will need to have an INR test regularly to ensure that your dose is keeping you in the desired range. The INR (international normalized ratio) number is calculated from the result of the laboratory test called prothrombin time (PT).  If an INR APPOINTMENT HAS NOT ALREADY BEEN MADE FOR YOU please schedule an appointment to have this lab work done by your health care provider within 7 days. Your INR goal is usually a number between:  2 to 3 or your provider may give you a more narrow range like 2-2.5.  Ask your health care provider during an office visit what your goal INR is.  What  do you need to  know  About  COUMADIN? Take Coumadin (warfarin) exactly as prescribed by your healthcare provider about the same time each day.  DO NOT stop taking without talking to the doctor who prescribed the medication.  Stopping without other blood clot prevention medication to take the place of Coumadin may increase your risk of developing a new clot or stroke.  Get refills before you run out.  What do you do if you miss a dose? If you miss a dose, take it as soon as you remember on the same day then continue your regularly scheduled regimen the next day.  Do not take two doses of Coumadin at the same time.  Important Safety Information A possible side effect of Coumadin (Warfarin) is an increased risk of bleeding. You should call your healthcare provider right away if you experience any of the following: ? Bleeding from an injury or your nose that does not stop. ? Unusual colored urine (red or dark brown) or unusual colored stools (red or black). ? Unusual bruising for unknown reasons. ? A serious fall or if you hit your head (even if there is no bleeding).  Some foods  or medicines interact with Coumadin (warfarin) and might alter your response to warfarin. To help avoid this: ? Eat a balanced diet, maintaining a consistent amount of Vitamin K. ? Notify your provider about major diet changes you plan to make. ? Avoid alcohol or limit your intake to 1 drink for women and 2 drinks for men per day. (1 drink is 5 oz. wine, 12 oz. beer, or 1.5 oz. liquor.)  Make sure that ANY health care provider who prescribes medication for you knows that you are taking Coumadin (warfarin).  Also make sure  the healthcare provider who is monitoring your Coumadin knows when you have started a new medication including herbals and non-prescription products.  Coumadin (Warfarin)  Major Drug Interactions  Increased Warfarin Effect Decreased Warfarin Effect  Alcohol (large quantities) Antibiotics (esp. Septra/Bactrim, Flagyl, Cipro) Amiodarone (Cordarone) Aspirin (ASA) Cimetidine (Tagamet) Megestrol (Megace) NSAIDs (ibuprofen, naproxen, etc.) Piroxicam (Feldene) Propafenone (Rythmol SR) Propranolol (Inderal) Isoniazid (INH) Posaconazole (Noxafil) Barbiturates (Phenobarbital) Carbamazepine (Tegretol) Chlordiazepoxide (Librium) Cholestyramine (Questran) Griseofulvin Oral Contraceptives Rifampin Sucralfate (Carafate) Vitamin K   Coumadin (Warfarin) Major Herbal Interactions  Increased Warfarin Effect Decreased Warfarin Effect  Garlic Ginseng Ginkgo biloba Coenzyme Q10 Green tea St. Johns wort    Coumadin (Warfarin) FOOD Interactions  Eat a consistent number of servings per week of foods HIGH in Vitamin K (1 serving =  cup)  Collards (cooked, or boiled & drained) Kale (cooked, or boiled & drained) Mustard greens (cooked, or boiled & drained) Parsley *serving size only =  cup Spinach (cooked, or boiled & drained) Swiss chard (cooked, or boiled & drained) Turnip greens (cooked, or boiled & drained)  Eat a consistent number of servings per week of foods  MEDIUM-HIGH in Vitamin K (1 serving = 1 cup)  Asparagus (cooked, or boiled & drained) Broccoli (cooked, boiled & drained, or raw & chopped) Brussel sprouts (cooked, or boiled & drained) *serving size only =  cup Lettuce, raw (green leaf, endive, romaine) Spinach, raw Turnip greens, raw & chopped   These websites have more information on Coumadin (warfarin):  http://[email or web address]-[place]ell.[email or web address]/; [email or web address]://www.Jas[employer].net/;

## 2016-07-05 NOTE — Progress Notes (Signed)
Progress Note  Patient Name: Jared [name] Date of Encounter: 07/05/2016  Primary Cardiologist: Tenny Craw  Subjective   Less dyspnea no palpitations   Inpatient Medications    Scheduled Meds: . arformoterol  15 mcg Nebulization BID  . carvedilol  12.5 mg Oral BID WC  . enoxaparin (LOVENOX) injection  1.5 mg/kg Subcutaneous Q24H  . furosemide  40 mg Intravenous Q8H  . nicotine  14 mg Transdermal Daily  . polyethylene glycol  17 g Oral BID  . sodium chloride flush  3 mL Intravenous Q12H  . umeclidinium bromide  1 puff Inhalation Daily  . warfarin   Does not apply Once  . Warfarin - Pharmacist Dosing Inpatient   Does not apply q1800   Continuous Infusions:  PRN Meds: acetaminophen **OR** acetaminophen, levalbuterol, traMADol   Vital Signs    Vitals:   07/04/16 0834 07/04/16 2004 07/05/16 0516 07/05/16 0811  BP: 106/68 116/70 125/81   Pulse: 74 (!) 51 91   Resp: 18 16 16    Temp: 98 F (36.7 C) 98.2 F (36.8 C) 98.7 F (37.1 C)   TempSrc: Oral Oral Oral   SpO2: 99% 98% 91% 93%  Weight:   195 lb 3.2 oz (88.5 kg)   Height:        Intake/Output Summary (Last 24 hours) at 07/05/16 1014 Last data filed at 07/05/16 0800  Gross per 24 hour  Intake           999.13 ml  Output             5400 ml  Net         -4400.87 ml   Filed Weights   07/03/16 0558 07/04/16 0609 07/05/16 0516  Weight: 211 lb 11.2 oz (96 kg) 204 lb 6.4 oz (92.7 kg) 195 lb 3.2 oz (88.5 kg)    Telemetry    AFib rates 90  - Personally Reviewed  ECG    AFib  - Personally Reviewed  Physical Exam   GEN: No acute distress.  Neck: No JVD Cardiac: RRR, no murmurs, rubs, or gallops.  Respiratory: Clear to auscultation bilaterally. GI: Soft, nontender, non-distended  MS: No edema; No deformity. Neuro:  AAOx3. Psych: Normal affect  Labs    Chemistry Recent Labs Lab 07/01/16 2019 07/02/16 0520 07/03/16 0441  07/04/16 0536 07/04/16 0711 07/05/16 0442  NA 142 141 140  < > 141 143 140  K  4.8 5.4* 5.4*  < > 3.8 4.0 3.5  CL 106 104 99*  < > 97* 93* 93*  CO2 30 31 33*  < > 38* 42* 40*  GLUCOSE 154* 168* 126*  < > 92 94 111*  BUN 23* 22* 25*  < > 20 20 13   CREATININE 1.37* 1.28* 1.47*  < > 1.33* 1.38* 1.08  CALCIUM 8.8* 8.7* 8.9  < > 8.4* 8.6* 8.5*  PROT 5.6* 5.8* 6.0*  --   --   --   --   ALBUMIN 3.3* 3.4* 3.4*  --   --   --   --   AST 118* 95* 56*  --   --   --   --   ALT 132* 130* 103*  --   --   --   --   ALKPHOS 216* 214* 183*  --   --   --   --   BILITOT 1.1 1.1 0.6  --   --   --   --   GFRNONAA 54* 58* 49*  < >  56* 53* >60  GFRAA >60 >60 57*  < > >60 >60 >60  ANIONGAP 6 6 8   < > 6 8 7   < > = values in this interval not displayed.   Hematology  Recent Labs Lab 07/04/16 0536 07/04/16 0711 07/05/16 0442  WBC 9.6 9.1 8.3  RBC 4.90 4.95 4.95  HGB 14.8 15.2 15.1  HCT 47.8 48.5 47.6  MCV 97.6 98.0 96.2  MCH 30.2 30.7 30.5  MCHC 31.0 31.3 31.7  RDW 15.1 14.9 14.5  PLT 193 192 209    Cardiac EnzymesNo results for input(s): TROPONINI in the last 168 hours.   Recent Labs Lab 06/28/16 1753 07/01/16 2022  TROPIPOC 0.04 0.03     BNP  Recent Labs Lab 07/01/16 2019  BNP 728.8*     DDimer No results for input(s): DDIMER in the last 168 hours.   Radiology    No results found.  Cardiac Studies   Echo EF 30-35 %  Patient Profile     61 y.o. male with CHF and PAF Non compliant with meds. 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

## 2016-07-05 NOTE — Progress Notes (Signed)
PROGRESS NOTE    Jared [name]  ZOX:[id number] DOB: September 10, 1955 DOA: 07/01/2016 PCP: Jaclyn Shaggy, MD   Chief Complaint  Patient presents with  . Shortness of Breath  . Chest Pain    Brief Narrative:  HPI on 07/01/2016 by Dr. Joen Laura Jared [name] is a 61 y.o. male with a past medical history significant for COPD not no home O2, CHF EF 35% who presents with 1 week worsening dyspnea, orthopnea, leg swelling. The patient was in his usual state of health until about a week ago when he ran out of Lasix (he told others that is pulmonology also told him to stop or reduce his Lasix). Since then he has had progressive shortness of breath with exertion, wheezing and cough and then around midweek he started to have orthopnea requiring that he sleep on the couch, as well as leg swelling that progressed up to his mid back.  He came to the ER 3 days ago, was stable, and discharged home with a Lasix refill, but states today that he couldn't get this filled because the pharmacy told him he had to wait 48 hours; in the interim his symptoms worsened, so today he came to the ER again.  No fever, chills.  Chronic sputum production and wheeze, slightly worse now.   Assessment & Plan   Acute on chronic systolic and diastolic CHF -Continue diuresis, IV lasix 40mg  DID -Patient discontinued Entresto as he could not afford it. -Bisoprolol switched to coreg- 12.5 BID -His ARB was stopped last year when [place] was trialed. If his BP allows, this should be restarted.  Losartan added today. -Weight is down to 194lbs  -Urine output over the past 24hrs 5825cc -Continue to monitor daily weights, intake/output  New onset Atrial Flutter -Patient developed HR in the 130s overnight on 07/03/2016. Was given IV digoxin and Cardizem.  -Placed on heparin and bridging to coumadin. -Spoke with pharmacy, will switch heparin to lovenox (daily) -Will have RN teach patient regarding injections. Spoke with patient on the  importance of going to coumadin clinic and having his INR checked. He understands and agrees. -Echocardiogram ordered: EF 30-35%, small pericardial effusion with dilated IVC -TSH 1.268 -Cardiology consulted and appreciated -Continue Coreg  Elevated LFTs -likely secondary to congestive hepatopathy.  -LFTs trending downward -complaints of abdominal pain, he relates he was told it was from live problems. Start low dose tramadol.  -[place]: Coarsened echotexture of the liver with slight surface nodularity suggesting cirrhosis. There may be mild sympathetic thickening from of the gallbladder given trace ascites noted in the right upper quadrant. Some of this may also be due to underdistention. No cholelithiasis. Chronic cholecystitis is not entirely excluded. -Continue to monitor CMP  COPD -Continue nebs -Currently stable  Tobacco abuse -Smoking cessation discussed -Continue nicotine patch  DVT Prophylaxis  Lovenox  Code Status: Ful  Family Communication: None at bedside  Disposition Plan: Admitted. Continue treatment of CHF and A. Flutter.  Consultants Cardiology  Procedures  Echocardiogram  Antibiotics   Anti-infectives    None      Subjective:   [facility] seen and examined today.  Feels breathing and leg swelling have improved. Denies chest pain, palpitations, abdominal pain, N/V/D/C, dizziness. Objective:   Vitals:   07/04/16 0834 07/04/16 2004 07/05/16 0516 07/05/16 0811  BP: 106/68 116/70 125/81   Pulse: 74 (!) 51 91   Resp: 18 16 16    Temp: 98 F (36.7 C) 98.2 F (36.8 C) 98.7 F (37.1 C)   TempSrc:  Oral Oral Oral   SpO2: 99% 98% 91% 93%  Weight:   88.5 kg (195 lb 3.2 oz)   Height:        Intake/Output Summary (Last 24 hours) at 07/05/16 1051 Last data filed at 07/05/16 0800  Gross per 24 hour  Intake           999.13 ml  Output             5400 ml  Net         -4400.87 ml   Filed Weights   07/03/16 0558 07/04/16 0609 07/05/16 0516  Weight: 96  kg (211 lb 11.2 oz) 92.7 kg (204 lb 6.4 oz) 88.5 kg (195 lb 3.2 oz)    Exam  General: Well developed, well nourished, NAD, appears stated age  HEENT: NCAT,  mucous membranes moist.   Cardiovascular: S1 S2 auscultated, irregularly irregular  Respiratory: Diminished but clear breath sounds   Abdomen: Soft, nontender, nondistended, + bowel sounds  Extremities: warm dry without cyanosis clubbing. B/L 2+LE edema   Neuro: AAOx3, nonfocal  Psych: Normal affect and demeanor, pleasant    Data Reviewed: I have personally reviewed following labs and imaging studies  CBC:  Recent Labs Lab 07/02/16 0520 07/03/16 0441 07/04/16 0536 07/04/16 0711 07/05/16 0442  WBC 5.9 12.6* 9.6 9.1 8.3  HGB 15.2 15.1 14.8 15.2 15.1  HCT 49.1 49.7 47.8 48.5 47.6  MCV 98.0 99.0 97.6 98.0 96.2  PLT 196 198 193 192 209   Basic Metabolic Panel:  Recent Labs Lab 07/03/16 0441 07/03/16 1647 07/04/16 0536 07/04/16 0711 07/05/16 0442  NA 140 140 141 143 140  K 5.4* 4.0 3.8 4.0 3.5  CL 99* 98* 97* 93* 93*  CO2 33* 36* 38* 42* 40*  GLUCOSE 126* 104* 92 94 111*  BUN 25* 24* 20 20 13   CREATININE 1.47* 1.35* 1.33* 1.38* 1.08  CALCIUM 8.9 8.6* 8.4* 8.6* 8.5*  MG  --  1.7  --   --   --    GFR: Estimated Creatinine Clearance: 84.8 mL/min (by C-G formula based on SCr of 1.08 mg/dL). Liver Function Tests:  Recent Labs Lab 06/28/16 2128 07/01/16 2019 07/02/16 0520 07/03/16 0441  AST 144* 118* 95* 56*  ALT 118* 132* 130* 103*  ALKPHOS 199* 216* 214* 183*  BILITOT 1.4* 1.1 1.1 0.6  PROT 5.8* 5.6* 5.8* 6.0*  ALBUMIN 3.4* 3.3* 3.4* 3.4*    Recent Labs Lab 07/01/16 2019  LIPASE 47   No results for input(s): AMMONIA in the last 168 hours. Coagulation Profile:  Recent Labs Lab 07/04/16 1142 07/05/16 0442  INR 1.17 1.18   Cardiac Enzymes: No results for input(s): CKTOTAL, CKMB, CKMBINDEX, TROPONINI in the last 168 hours. BNP (last 3 results) No results for input(s): PROBNP in the  last 8760 hours. HbA1C: No results for input(s): HGBA1C in the last 72 hours. CBG: No results for input(s): GLUCAP in the last 168 hours. Lipid Profile: No results for input(s): CHOL, HDL, LDLCALC, TRIG, CHOLHDL, LDLDIRECT in the last 72 hours. Thyroid Function Tests:  Recent Labs  07/04/16 0820  TSH 1.268   Anemia Panel: No results for input(s): VITAMINB12, FOLATE, FERRITIN, TIBC, IRON, RETICCTPCT in the last 72 hours. Urine analysis:    Component Value Date/Time   COLORURINE AMBER (A) 07/01/2016 2022   APPEARANCEUR CLEAR 07/01/2016 2022   LABSPEC 1.025 07/01/2016 2022   PHURINE 5.0 07/01/2016 2022   GLUCOSEU NEGATIVE 07/01/2016 2022   HGBUR NEGATIVE 07/01/2016 2022  BILIRUBINUR NEGATIVE 07/01/2016 2022   KETONESUR NEGATIVE 07/01/2016 2022   PROTEINUR 100 (A) 07/01/2016 2022   NITRITE NEGATIVE 07/01/2016 2022   LEUKOCYTESUR NEGATIVE 07/01/2016 2022   Sepsis Labs: @LABRCNTIP (procalcitonin:4,lacticidven:4)  )No results found for this or any previous visit (from the past 240 hour(s)).    Radiology Studies: No results found.   Scheduled Meds: . arformoterol  15 mcg Nebulization BID  . carvedilol  12.5 mg Oral BID WC  . enoxaparin (LOVENOX) injection  1.5 mg/kg Subcutaneous Q24H  . furosemide  40 mg Intravenous BID  . losartan  25 mg Oral Daily  . nicotine  14 mg Transdermal Daily  . polyethylene glycol  17 g Oral BID  . sodium chloride flush  3 mL Intravenous Q12H  . umeclidinium bromide  1 puff Inhalation Daily  . warfarin   Does not apply Once  . Warfarin - Pharmacist Dosing Inpatient   Does not apply q1800   Continuous Infusions:    LOS: 4 days   Time Spent in minutes   30 minutes  Dalon Reichart D.O. on 07/05/2016 at 10:51 AM  Between 7am to 7pm - Pager - [phone]  After 7pm go to [email or web address] - password [email or web address]  And look for the night coverage person covering for me after hours  [facility] Office  [phone]

## 2016-07-05 NOTE — Progress Notes (Signed)
ANTICOAGULATION CONSULT NOTE  Pharmacy Consult for Heparin and warfarin Indication: atrial fibrillation  Allergies  Allergen Reactions  . Lisinopril Cough    Patient Measurements: Height: 6\' 3"  (190.5 cm) Weight: 195 lb 3.2 oz (88.5 kg) (scale c) IBW/kg (Calculated) : 84.5 Heparin Dosing Weight: 92.7 kg  Vital Signs: Temp: 98.7 F (37.1 C) (01/25 0516) Temp Source: Oral (01/25 0516) BP: 125/81 (01/25 0516) Pulse Rate: 91 (01/25 0516)  Labs:  Recent Labs  07/04/16 0536 07/04/16 0711 07/04/16 1142 07/04/16 1549 07/05/16 0442  HGB 14.8 15.2  --   --  15.1  HCT 47.8 48.5  --   --  47.6  PLT 193 192  --   --  209  LABPROT  --   --  15.0  --  15.1  INR  --   --  1.17  --  1.18  HEPARINUNFRC  --   --   --  0.61 0.51  CREATININE 1.33* 1.38*  --   --  1.08    Estimated Creatinine Clearance: 84.8 mL/min (by C-G formula based on SCr of 1.08 mg/dL).   Medical History: Past Medical History:  Diagnosis Date  . CAD (coronary artery disease)    a. LHC 5/12:  LAD 20, pLCx 20, pRCA 40, dRCA 40, EF 35%, diff HK  //  b. Myoview 4/16: Overall Impression: High risk stress nuclear study There is no evidence of ischemia. There is severe LV dysfunction. LV Ejection Fraction: 30%. LV Wall Motion: There is global LV hypokinesis.   Marland Kitchen CAP (community acquired pneumonia) 09/2013  . Chronic combined systolic and diastolic CHF (congestive heart failure) (HCC)    a. Echo 4/16:Mild LVH, EF 40-45%, diffuse HK //  b. Echo 8/17: EF 35-40%, diffuse HK, diastolic dysfunction, aortic sclerosis, trivial MR, moderate LAE, normal RVSF, moderate RAE, mild TR, PASP 42 mmHg  . Cluster headache    "hx; haven't had one in awhile" (01/09/2016)  . COPD (chronic obstructive pulmonary disease) (HCC)    [name] 01/09/2016  . History of CVA (cerebrovascular accident)   . Hypertension   . NICM (nonischemic cardiomyopathy) (HCC)   . Tobacco abuse     Medications:  Prescriptions Prior to Admission   Medication Sig Dispense Refill Last Dose  . albuterol (PROVENTIL HFA;VENTOLIN HFA) 108 (90 Base) MCG/ACT inhaler Inhale 2 puffs into the lungs every 6 (six) hours as needed for wheezing or shortness of breath. 1 Inhaler 11 07/01/2016 at Unknown time  . bisoprolol (ZEBETA) 5 MG tablet Take 1 tablet (5 mg total) by mouth daily. 30 tablet 11 07/01/2016 at Unknown time  . Glycopyrrolate-Formoterol (BEVESPI AEROSPHERE) 9-4.8 MCG/ACT AERO Inhale 2 puffs into the lungs 2 (two) times daily.   07/01/2016 at Unknown time  . potassium chloride SA (K-DUR,KLOR-CON) 20 MEQ tablet Take 2 tablets (40 mEq total) by mouth daily. 60 tablet 0 07/01/2016 at Unknown time  . furosemide (LASIX) 20 MG tablet Take 1 tablet (20 mg total) by mouth daily. (Patient not taking: Reported on 07/01/2016) 30 tablet 0 Not Taking at Unknown time   Scheduled:  . arformoterol  15 mcg Nebulization BID  . carvedilol  12.5 mg Oral BID WC  . furosemide  40 mg Intravenous Q8H  . nicotine  14 mg Transdermal Daily  . polyethylene glycol  17 g Oral BID  . potassium chloride  40 mEq Oral Once  . sodium chloride flush  3 mL Intravenous Q12H  . umeclidinium bromide  1 puff Inhalation Daily  . warfarin  Does not apply Once  . Warfarin - Pharmacist Dosing Inpatient   Does not apply q1800   Infusions:  . heparin 1,450 Units/hr (07/04/16 2018)    Assessment: 60yo male with history of COPD presents with dyspnea, orthopnea and leg swelling. Pharmacy is consulted to dose heparin and warfarin for atrial fibrillation. INR at baseline is 1.17. CBC wnl.  This morning's heparin level remains therapeutic at 0.51 on heparin 1450 units/hr. INR remains subtherapeutic at 1.18 following warfarin start 1/24. No issues with infusion or bleeding noted.  Goal of Therapy:  INR 2-3 Heparin level 0.3-0.7 units/ml Monitor platelets by anticoagulation protocol: Yes   Plan:  Continue heparin infusion at 1450 units/hr Warfarin 5mg  tonight x1 Daily heparin  level/INR/CBC Monitor for s/sx bleeding   Arlean Hopping. Newman Pies, PharmD, BCPS Clinical Pharmacist [phone] 07/05/2016 8:07 AM

## 2016-07-05 NOTE — Progress Notes (Signed)
At bedside, taught pt how to inject Lovenox through teach back and demonstration and provided printed education material. Pt completed self injection well.    Jared [name]

## 2016-07-06 ENCOUNTER — Telehealth: Payer: Self-pay | Admitting: Physician Assistant

## 2016-07-06 DIAGNOSIS — J432 Centrilobular emphysema: Secondary | ICD-10-CM

## 2016-07-06 DIAGNOSIS — I483 Typical atrial flutter: Secondary | ICD-10-CM

## 2016-07-06 LAB — COMPREHENSIVE METABOLIC PANEL
ALT: 58 U/L (ref 17–63)
AST: 36 U/L (ref 15–41)
Albumin: 2.9 g/dL — ABNORMAL LOW (ref 3.5–5.0)
Alkaline Phosphatase: 130 U/L — ABNORMAL HIGH (ref 38–126)
Anion gap: 10 (ref 5–15)
BUN: 12 mg/dL (ref 6–20)
CO2: 38 mmol/L — ABNORMAL HIGH (ref 22–32)
Calcium: 8.6 mg/dL — ABNORMAL LOW (ref 8.9–10.3)
Chloride: 93 mmol/L — ABNORMAL LOW (ref 101–111)
Creatinine, Ser: 1.11 mg/dL (ref 0.61–1.24)
GFR calc Af Amer: >60 mL/min (ref >60)
GFR calc non Af Amer: >60 mL/min (ref >60)
Glucose, Bld: 101 mg/dL — ABNORMAL HIGH (ref 65–99)
Potassium: 3.8 mmol/L (ref 3.5–5.1)
Sodium: 141 mmol/L (ref 135–145)
Total Bilirubin: 1 mg/dL (ref 0.3–1.2)
Total Protein: 5.5 g/dL — ABNORMAL LOW (ref 6.5–8.1)

## 2016-07-06 LAB — CBC
HCT: 48.7 % (ref 39.0–52.0)
Hemoglobin: 15.2 g/dL (ref 13.0–17.0)
MCH: 30.3 pg (ref 26.0–34.0)
MCHC: 31.2 g/dL (ref 30.0–36.0)
MCV: 97 fL (ref 78.0–100.0)
Platelets: 192 10*3/uL (ref 150–400)
RBC: 5.02 MIL/uL (ref 4.22–5.81)
RDW: 14.3 % (ref 11.5–15.5)
WBC: 6 10*3/uL (ref 4.0–10.5)

## 2016-07-06 LAB — PROTIME-INR
INR: 1.16
Prothrombin Time: 14.9 seconds (ref 11.4–15.2)

## 2016-07-06 MED ORDER — WARFARIN SODIUM 7.5 MG PO TABS: 7.5000 mg | ORAL_TABLET | Freq: Once | ORAL | Status: DC

## 2016-07-06 MED ORDER — ENOXAPARIN SODIUM 150 MG/ML ~~LOC~~ SOLN: 1.5000 mg/kg | SUBCUTANEOUS | 0 refills | Status: DC

## 2016-07-06 MED ORDER — FUROSEMIDE 40 MG PO TABS: 40.0000 mg | ORAL_TABLET | Freq: Two times a day (BID) | ORAL | 0 refills | Status: DC

## 2016-07-06 MED ORDER — POTASSIUM CHLORIDE CRYS ER 10 MEQ PO TBCR: 10.0000 meq | EXTENDED_RELEASE_TABLET | Freq: Every day | ORAL | 0 refills | Status: DC

## 2016-07-06 MED ORDER — LOSARTAN POTASSIUM 25 MG PO TABS: 25.0000 mg | ORAL_TABLET | Freq: Every day | ORAL | 0 refills | Status: DC

## 2016-07-06 MED ORDER — CARVEDILOL 12.5 MG PO TABS: 12.5000 mg | ORAL_TABLET | Freq: Two times a day (BID) | ORAL | 0 refills | Status: DC

## 2016-07-06 MED ORDER — WARFARIN SODIUM 7.5 MG PO TABS: 7.5000 mg | ORAL_TABLET | Freq: Once | ORAL | 0 refills | Status: DC

## 2016-07-06 MED ORDER — NICOTINE 14 MG/24HR TD PT24: 14.0000 mg | MEDICATED_PATCH | Freq: Every day | TRANSDERMAL | 0 refills | Status: DC

## 2016-07-06 MED FILL — WARFARIN NA 7.5 MG TAB: 7.5 | 30 days supply | Qty: 30 | Fill #0

## 2016-07-06 MED FILL — ENOXAPARIN 150 MG/ML SYRN: 150 | 7 days supply | Qty: 7 | Fill #0

## 2016-07-06 MED FILL — CARVEDILOL 12.5 MG TABLET: 12.5 | 30 days supply | Qty: 60 | Fill #0

## 2016-07-06 MED FILL — LOSARTAN POTASSIUM 25 MG TA: 25 | 30 days supply | Qty: 30 | Fill #0

## 2016-07-06 MED FILL — POTASSIUM CL 10 MEQ TAB SA: 10 | 30 days supply | Qty: 30 | Fill #0

## 2016-07-06 NOTE — Progress Notes (Signed)
ANTICOAGULATION CONSULT NOTE  Pharmacy Consult for Warfarin Indication: atrial fibrillation  Allergies  Allergen Reactions  . Lisinopril Cough    Patient Measurements: Height: 6\' 3"  (190.5 cm) Weight: 195 lb 3.2 oz (88.5 kg) (scale c) IBW/kg (Calculated) : 84.5  Vital Signs: BP: 119/86 (01/26 0757)  Labs:  Recent Labs  07/04/16 0711 07/04/16 1142 07/04/16 1549 07/05/16 0442 07/06/16 0446  HGB 15.2  --   --  15.1 15.2  HCT 48.5  --   --  47.6 48.7  PLT 192  --   --  209 192  LABPROT  --  15.0  --  15.1 14.9  INR  --  1.17  --  1.18 1.16  HEPARINUNFRC  --   --  0.61 0.51  --   CREATININE 1.38*  --   --  1.08 1.11    Estimated Creatinine Clearance: 82.5 mL/min (by C-G formula based on SCr of 1.11 mg/dL).   Medical History: Past Medical History:  Diagnosis Date  . CAD (coronary artery disease)    a. LHC 5/12:  LAD 20, pLCx 20, pRCA 40, dRCA 40, EF 35%, diff HK  //  b. Myoview 4/16: Overall Impression: High risk stress nuclear study There is no evidence of ischemia. There is severe LV dysfunction. LV Ejection Fraction: 30%. LV Wall Motion: There is global LV hypokinesis.   Marland Kitchen CAP (community acquired pneumonia) 09/2013  . Chronic combined systolic and diastolic CHF (congestive heart failure) (HCC)    a. Echo 4/16:Mild LVH, EF 40-45%, diffuse HK //  b. Echo 8/17: EF 35-40%, diffuse HK, diastolic dysfunction, aortic sclerosis, trivial MR, moderate LAE, normal RVSF, moderate RAE, mild TR, PASP 42 mmHg  . Cluster headache    "hx; haven't had one in awhile" (01/09/2016)  . COPD (chronic obstructive pulmonary disease) (HCC)    [name] 01/09/2016  . History of CVA (cerebrovascular accident)   . Hypertension   . NICM (nonischemic cardiomyopathy) (HCC)   . Tobacco abuse     Medications:  Prescriptions Prior to Admission  Medication Sig Dispense Refill Last Dose  . albuterol (PROVENTIL HFA;VENTOLIN HFA) 108 (90 Base) MCG/ACT inhaler Inhale 2 puffs into the lungs every 6  (six) hours as needed for wheezing or shortness of breath. 1 Inhaler 11 07/01/2016 at Unknown time  . bisoprolol (ZEBETA) 5 MG tablet Take 1 tablet (5 mg total) by mouth daily. 30 tablet 11 07/01/2016 at Unknown time  . Glycopyrrolate-Formoterol (BEVESPI AEROSPHERE) 9-4.8 MCG/ACT AERO Inhale 2 puffs into the lungs 2 (two) times daily.   07/01/2016 at Unknown time  . potassium chloride SA (K-DUR,KLOR-CON) 20 MEQ tablet Take 2 tablets (40 mEq total) by mouth daily. 60 tablet 0 07/01/2016 at Unknown time  . furosemide (LASIX) 20 MG tablet Take 1 tablet (20 mg total) by mouth daily. (Patient not taking: Reported on 07/01/2016) 30 tablet 0 Not Taking at Unknown time   Scheduled:  . arformoterol  15 mcg Nebulization BID  . carvedilol  12.5 mg Oral BID WC  . enoxaparin (LOVENOX) injection  1.5 mg/kg Subcutaneous Q24H  . furosemide  40 mg Intravenous BID  . losartan  25 mg Oral Daily  . nicotine  14 mg Transdermal Daily  . polyethylene glycol  17 g Oral BID  . sodium chloride flush  3 mL Intravenous Q12H  . umeclidinium bromide  1 puff Inhalation Daily  . warfarin   Does not apply Once  . Warfarin - Pharmacist Dosing Inpatient   Does not apply [phone]  Infusions:    Assessment: 61yo male with history of COPD presents with dyspnea, orthopnea and leg swelling. Pharmacy is consulted to dose heparin and warfarin for atrial fibrillation. INR at baseline is 1.17. CBC wnl.   Goal of Therapy:  INR 2-3 Monitor platelets by anticoagulation protocol: Yes   Plan:  Warfarin 7.5mg  tonight x1 Lovenox 1.5mg /kg subcutaneously q24h Monitor for s/sx bleeding   Arlean Hopping. Newman Pies, PharmD, BCPS Clinical Pharmacist [phone] 07/06/2016 8:42 AM

## 2016-07-06 NOTE — Progress Notes (Signed)
Pt IV discontinued, catheter intact and telemetry removed. PT discharge education provided at bedside with pt and wife. Pt has all discharge paper work, prescriptions and home oxygen. Pt discharged via wheelchair with home oxygen and nurse.   Jared [name]

## 2016-07-06 NOTE — Telephone Encounter (Signed)
New message      TCM appt on 07-13-16 with Tereso Newcomer per Coralee [facility]

## 2016-07-06 NOTE — Discharge Summary (Addendum)
Physician Discharge Summary  [place] ZOX:[id number] DOB: 1956/01/27 DOA: 07/01/2016  PCP: Jaclyn Shaggy, MD  Admit date: 07/01/2016 Discharge date: 07/06/2016  Time spent: 45 minutes  Recommendations for Outpatient Follow-up:  Patient will be discharged to home with home oxygen.  Patient will need to follow up with primary care provider within one week of discharge.  Follow up with the coumadin clinic on Wednesday, 07/11/2016.  Continue lovenox injections until 07/11/2016. Follow up with cardiology, Dr. Tenny Craw.   Patient should continue medications as prescribed.  Patient should follow a heart healthy diet.   Discharge Diagnoses:  Acute on chronic systolic and diastolic CHF New onset Atrial Flutter Elevated LFTs COPD Tobacco abuse  Discharge Condition: Stable  Diet recommendation: heart healthy  Filed Weights   07/03/16 0558 07/04/16 0609 07/05/16 0516  Weight: 96 kg (211 lb 11.2 oz) 92.7 kg (204 lb 6.4 oz) 88.5 kg (195 lb 3.2 oz)    History of present illness:  on 07/01/2016 by Dr. Elesa Massed Hinesis a 61 y.o.malewith a past medical history significant for COPD not no home O2, CHF EF 35%who presents with 1 week worsening dyspnea, orthopnea, leg swelling. The patient was in his usual state of health until about a week ago when he ran out of Lasix (he told others that is pulmonology also told him to stop or reduce his Lasix). Since then he has had progressive shortness of breath with exertion, wheezing and cough and then around midweek he started to have orthopnea requiring that he sleep on the couch, as well as leg swelling that progressed up to his mid back. He came to the ER 3 days ago, was stable, and discharged home with a Lasix refill, but states today that he couldn't get this filled because the pharmacy told him he had to wait 48 hours; in the interim his symptoms worsened, so today he came to the ER again. No fever, chills. Chronic sputum production and  wheeze, slightly worse now.   Hospital Course:  Acute on chronic systolic and diastolic CHF -Initially placed on IV lasix 40mg  BID -Patient discontinued Entresto as he could not afford it. -Bisoprolol switched to coreg- 12.5 BID -His ARB was stopped last year when [place] was trialed. If his BP allows, this should be restarted.  Losartan started -Weight is down to 195lbs  -Urine output over the past 24hrs 1650cc -Continue to monitor daily weights, intake/output -Continue lasix 40mg  BID and losartan  New onset Atrial Flutter -Patient developed HR in the 130s overnight on 07/03/2016. Was given IV digoxin and Cardizem.  -Placed on heparin and bridging to coumadin. -Spoke with pharmacy, switched from heparin to lovenox (daily) -Will have RN teach patient regarding injections. Spoke with patient on the importance of going to coumadin clinic and having his INR checked. He understands and agrees. -Echocardiogram ordered: EF 30-35%, small pericardial effusion with dilated IVC -TSH 1.268 -Cardiology consulted and appreciated -Continue Coreg  Elevated LFTs -likely secondary to congestive hepatopathy.  -LFTs trending downward- have normalized -complaints of abdominal pain, he relates he was told it was from live problems. Start low dose tramadol.  -[place]: Coarsened echotexture of the liver with slight surface nodularity suggesting cirrhosis. There may be mild sympathetic thickening from of the gallbladder given trace ascites noted in the right upper quadrant. Some of this may also be due to underdistention. No cholelithiasis. Chronic cholecystitis is not entirely excluded.  COPD/hypoxia with ambulation -Continue home meds at discharge -Currently stable -Patient was placed on nasal canula  on admission. During ambulation, patient's O2 sats were 84-86%.  Will send home with supplemental oxygen to be used during exertion.   Tobacco abuse -Smoking cessation discussed -Continue nicotine  patch  Consultants Cardiology  Procedures  Echocardiogram  Discharge Exam: Vitals:   07/06/16 0934 07/06/16 1004  BP:  113/65  Pulse:  (!) 103  Resp: 16 18  Temp:  97.6 F (36.4 C)   Feels breathing and leg swelling have improved and close to his baseline. Denies chest pain, palpitations, abdominal pain, N/V/D/C, dizziness.  Exam  General: Well developed, well nourished, NAD, appears stated age  HEENT: NCAT,  mucous membranes moist.   Cardiovascular: S1 S2 auscultated, irregular  Respiratory: Diminished but clear breath sounds   Abdomen: Soft, nontender, nondistended, + bowel sounds  Extremities: warm dry without cyanosis clubbing. B/L trace LE edema   Neuro: AAOx3, nonfocal  Psych: Normal affect and demeanor, pleasant   Discharge Instructions Discharge Instructions    Discharge instructions    Complete by:  As directed    Patient will be discharged to home.  Patient will need to follow up with primary care provider within one week of discharge.  Follow up with the coumadin clinic on Wednesday, 07/11/2016.  Continue lovenox injections until 07/11/2016. Follow up with cardiology, Dr. Tenny Craw.   Patient should continue medications as prescribed.  Patient should follow a heart healthy diet.     Current Discharge Medication List    START taking these medications   Details  carvedilol (COREG) 12.5 MG tablet Take 1 tablet (12.5 mg total) by mouth 2 (two) times daily with a meal. Qty: 60 tablet, Refills: 0    enoxaparin (LOVENOX) 150 MG/ML injection Inject 0.89 mLs (135 mg total) into the skin daily. Qty: 7 Syringe, Refills: 0    losartan (COZAAR) 25 MG tablet Take 1 tablet (25 mg total) by mouth daily. Qty: 30 tablet, Refills: 0    nicotine (NICODERM CQ - DOSED IN MG/24 HOURS) 14 mg/24hr patch Place 1 patch (14 mg total) onto the skin daily. Qty: 28 patch, Refills: 0    warfarin (COUMADIN) 7.5 MG tablet Take 1 tablet (7.5 mg total) by mouth one time only at 6  PM. Qty: 30 tablet, Refills: 0      CONTINUE these medications which have CHANGED   Details  furosemide (LASIX) 40 MG tablet Take 1 tablet (40 mg total) by mouth 2 (two) times daily. Qty: 60 tablet, Refills: 0   Associated Diagnoses: Chronic combined systolic and diastolic CHF (congestive heart failure) (HCC)    potassium chloride SA (K-DUR,KLOR-CON) 10 MEQ tablet Take 1 tablet (10 mEq total) by mouth daily. Qty: 30 tablet, Refills: 0   Associated Diagnoses: Chronic combined systolic and diastolic CHF (congestive heart failure) (HCC)      CONTINUE these medications which have NOT CHANGED   Details  albuterol (PROVENTIL HFA;VENTOLIN HFA) 108 (90 Base) MCG/ACT inhaler Inhale 2 puffs into the lungs every 6 (six) hours as needed for wheezing or shortness of breath. Qty: 1 Inhaler, Refills: 11   Associated Diagnoses: COPD with acute exacerbation (HCC)    Glycopyrrolate-Formoterol (BEVESPI AEROSPHERE) 9-4.8 MCG/ACT AERO Inhale 2 puffs into the lungs 2 (two) times daily.      STOP taking these medications     bisoprolol (ZEBETA) 5 MG tablet        Allergies  Allergen Reactions  . Lisinopril Cough   Follow-up Information    [facility] Follow up on 08/08/2016.  Specialty:  Internal Medicine Why:  At 9:00am for hospital follow up  Contact information: [address] [place] [address] [phone]       Tereso Newcomer, PA-C Follow up on 07/13/2016.   Specialties:  Cardiology, Physician Assistant Why:  at 10:15. Please bring all of your medications to the apointment. Contact information: [address] [place] [address] [phone]        [facility] Follow up on 07/11/2016.   Specialty:  Cardiology Why:  at 9:30 for bloodwork for coumadin therapy.  **Important** to keep this appointment. Contact information: [address] [place] [address] [phone]            The results of significant diagnostics from this hospitalization (including imaging, microbiology, ancillary and laboratory) are listed below for reference.    Significant Diagnostic Studies: Dg Chest 2 View  Result Date: 07/01/2016 CLINICAL DATA:  61 year old male with chest pain and shortness of breath. History of COPD and CHF. EXAM: CHEST  2 VIEW COMPARISON:  Chest radiograph dated 06/28/2016 and CT dated 02/20/2016. FINDINGS: There is emphysematous changes of the lungs. No focal consolidation, pleural effusion, or pneumothorax. There is persistent mild elevation of the right hemidiaphragm. Right lung base infiltrate or a subpulmonic effusion is not entirely excluded. There is stable cardiomegaly. The osseous structures are intact. IMPRESSION: Emphysema.  No focal consolidation or pneumothorax. Stable mild elevation of the right hemidiaphragm. Stable cardiomegaly.  No congestive changes or interstitial edema. Electronically Signed   By: Elgie Collard M.D.   On: 07/01/2016 21:37   Dg Chest 2 View  Result Date: 06/28/2016 CLINICAL DATA:  Cough and shortness of breath, chest pain and abdominal pain with swelling since yesterday. History of COPD. EXAM: CHEST  2 VIEW COMPARISON:  Chest x-rays dated 06/03/2016, 05/28/2016 and 05/08/2016. FINDINGS: Cardiomegaly is not significantly changed compared to previous exams. Atherosclerotic changes noted at the aortic arch. Lungs are hyperexpanded. Coarse interstitial lung markings noted bilaterally suggesting chronic interstitial lung disease. Questionable small pleural effusion at the right costophrenic angle. No large pleural effusion. No pneumothorax. No acute or suspicious osseous finding. IMPRESSION: 1. No acute findings.  No evidence of pneumonia or pulmonary edema. 2. Hyperexpanded lungs suggesting COPD. Suspect associated chronic interstitial lung disease. 3. Cardiomegaly. 4. Aortic atherosclerosis. Electronically Signed   By: Bary Richard M.D.    On: 06/28/2016 17:48   Ct Chest Wo Contrast  Result Date: 06/26/2016 CLINICAL DATA:  Increased shortness of breath over the past year. Productive cough. Smoker and COPD EXAM: CT CHEST WITHOUT CONTRAST TECHNIQUE: Multidetector CT imaging of the chest was performed following the standard protocol without IV contrast. COMPARISON:  02/20/2016 FINDINGS: Cardiovascular: Moderate cardiac enlargement. Aortic atherosclerosis. Calcification involving the RCA, LAD and left circumflex coronary artery noted. Mediastinum/Nodes: The trachea appears patent and is midline. Normal appearance of the esophagus. No axillary or supraclavicular adenopathy. Lungs/Pleura: Small right pleural effusion noted. Overlying scar versus subsegmental atelectasis noted. Moderate to marked centrilobular emphysema. Diffuse bronchial wall thickening noted. 7 mm subpleural nodule overlying the anterior left upper lobe is identified, image 66 of series 3. Unchanged from previous exam. Upper Abdomen: No acute abnormality. Musculoskeletal: No aggressive lytic or sclerotic bone lesions. IMPRESSION: 1. Diffuse bronchial wall thickening with emphysema, as above; imaging findings suggestive of underlying COPD. 2. Aortic atherosclerosis and 3 vessel coronary artery calcification 3. Small right pleural effusion 4. 7 mm subpleural nodule is stable from 02/20/2016. Future  CT at 18-24 months (from 02/20/2016) is considered optional for low-risk patients, but is recommended for high-risk patients. This recommendation follows the consensus statement: Guidelines for Management of Incidental Pulmonary Nodules Detected on CT Images:From the Fleischner Society 2017; published online before print (10.1148/radiol.1610960454). Electronically Signed   By: Signa Kell M.D.   On: 06/26/2016 10:30   US Abdomen Complete  Result Date: 06/28/2016 CLINICAL DATA:  Chest and abdominal pain. Swelling. Tobacco abuse and hypertension. EXAM: ABDOMEN ULTRASOUND COMPLETE  COMPARISON:  05/04/2016 gallbladder ultrasound. Chest CT 06/27/2015 on FINDINGS: Gallbladder: The gallbladder is not distended in appearance. There is a thickened and slightly edematous appearance of the gallbladder wall some which may be due to underdistention and/or sympathetic thickening given trace ascites noted in the right upper quadrant. No cholelithiasis nor sonographic Murphy's. Common bile duct: Diameter:  2 mm.  No choledocholithiasis. Liver: Slightly coarsened liver echotexture with surface nodularity suggesting cirrhosis. No space-occupying mass or biliary dilatation. Patent portal vein. IVC: No abnormality visualized. Pancreas: Visualized portion unremarkable. Spleen: Size and appearance within normal limits. Right Kidney: Length: 12.1 cm. Echogenicity within normal limits. No mass or hydronephrosis visualized. Left Kidney: Length: 11.9 cm. Echogenicity within normal limits. No mass or hydronephrosis visualized. Abdominal aorta: The distal ureter was not visualized. The the proximal and mid aorta are or not aneurysmal with maximum caliber 2.7 cm. Other findings: Small right pleural effusion. IMPRESSION: Coarsened echotexture of the liver with slight surface nodularity suggesting cirrhosis. There may be mild sympathetic thickening from of the gallbladder given trace ascites noted in the right upper quadrant. Some of this may also be due to underdistention. No cholelithiasis. Chronic cholecystitis is not entirely excluded. Electronically Signed   By: Tollie Eth M.D.   On: 06/28/2016 22:45    Microbiology: No results found for this or any previous visit (from the past 240 hour(s)).   Labs: Basic Metabolic Panel:  Recent Labs Lab 07/03/16 1647 07/04/16 0536 07/04/16 0711 07/05/16 0442 07/06/16 0446  NA 140 141 143 140 141  K 4.0 3.8 4.0 3.5 3.8  CL 98* 97* 93* 93* 93*  CO2 36* 38* 42* 40* 38*  GLUCOSE 104* 92 94 111* 101*  BUN 24* 20 20 13 12   CREATININE 1.35* 1.33* 1.38* 1.08 1.11   CALCIUM 8.6* 8.4* 8.6* 8.5* 8.6*  MG 1.7  --   --   --   --    Liver Function Tests:  Recent Labs Lab 07/01/16 2019 07/02/16 0520 07/03/16 0441 07/06/16 0446  AST 118* 95* 56* 36  ALT 132* 130* 103* 58  ALKPHOS 216* 214* 183* 130*  BILITOT 1.1 1.1 0.6 1.0  PROT 5.6* 5.8* 6.0* 5.5*  ALBUMIN 3.3* 3.4* 3.4* 2.9*    Recent Labs Lab 07/01/16 2019  LIPASE 47   No results for input(s): AMMONIA in the last 168 hours. CBC:  Recent Labs Lab 07/03/16 0441 07/04/16 0536 07/04/16 0711 07/05/16 0442 07/06/16 0446  WBC 12.6* 9.6 9.1 8.3 6.0  HGB 15.1 14.8 15.2 15.1 15.2  HCT 49.7 47.8 48.5 47.6 48.7  MCV 99.0 97.6 98.0 96.2 97.0  PLT 198 193 192 209 192   Cardiac Enzymes: No results for input(s): CKTOTAL, CKMB, CKMBINDEX, TROPONINI in the last 168 hours. BNP: BNP (last 3 results)  Recent Labs  02/20/16 0654 06/03/16 1048 07/01/16 2019  BNP 867.6* 859.9* 728.8*    ProBNP (last 3 results) No results for input(s): PROBNP in the last 8760 hours.  CBG: No results for input(s): GLUCAP in the  last 168 hours.     SignedEdsel Petrin  [facility] 07/06/2016, 10:46 AM

## 2016-07-06 NOTE — Progress Notes (Signed)
Patient goes to [facility] for primary care and he gets his prescriptions filled there also; CM talked to patient and he plans to go to the [facility] today to pick up his medication. Abelino Derrick [facility] [place] [phone]

## 2016-07-06 NOTE — Progress Notes (Signed)
Called MD to inform of ambulation progress note completed, MD placed order for home O2. Case management aware of home O2 order. Awaiting delivery of tank.   Jared [name]

## 2016-07-06 NOTE — Evaluation (Signed)
Physical Therapy Evaluation Patient Details Name: Jared [name] MRN: [id number] DOB: [birth year] Today's Date: 07/06/2016   History of Present Illness  Pt is a 61 y/o male admitted secondary to dyspnea. PMH including but not limited to CHF, COPD, CAD, HTN and hx of CVA.  Clinical Impression  Pt presented sitting EOB when therapist entered room. Prior to admission, pt reported that he was independent with all functional mobility and ADLs. Pt currently at mod I with all functional mobility. Pt able to sit EOB to perform UB dressing. No further acute PT needs identified at this time. PT signing off.    Follow Up Recommendations No PT follow up    Equipment Recommendations  None recommended by PT    Recommendations for Other Services       Precautions / Restrictions Precautions Precautions: None Restrictions Weight Bearing Restrictions: No      Mobility  Bed Mobility Overal bed mobility: Modified Independent                Transfers Overall transfer level: Modified independent                  Ambulation/Gait Ambulation/Gait assistance: Modified independent (Device/Increase time) Ambulation Distance (Feet): 25 Feet Assistive device: None (pushing O2 tank) Gait Pattern/deviations: Step-through pattern Gait velocity: WNL Gait velocity interpretation: at or above normal speed for age/gender General Gait Details: no instability or LOB  Stairs            Wheelchair Mobility    Modified Rankin (Stroke Patients Only)       Balance Overall balance assessment: No apparent balance deficits (not formally assessed)                                           Pertinent Vitals/Pain Pain Assessment: No/denies pain    Home Living Family/patient expects to be discharged to:: Private residence Living Arrangements: Alone Available Help at Discharge: Family;Available PRN/intermittently Type of Home: House Home Access: Stairs to enter Entrance  Stairs-Rails: Can reach both Entrance Stairs-Number of Steps: 3 Home Layout: One level Home Equipment: None      Prior Function Level of Independence: Independent               Hand Dominance        Extremity/Trunk Assessment   Upper Extremity Assessment Upper Extremity Assessment: Overall WFL for tasks assessed    Lower Extremity Assessment Lower Extremity Assessment: Overall WFL for tasks assessed    Cervical / Trunk Assessment Cervical / Trunk Assessment: Normal  Communication   Communication: No difficulties  Cognition Arousal/Alertness: Awake/alert Behavior During Therapy: WFL for tasks assessed/performed Overall Cognitive Status: Within Functional Limits for tasks assessed                      General Comments      Exercises     Assessment/Plan    PT Assessment Patent does not need any further PT services  PT Problem List            PT Treatment Interventions      PT Goals (Current goals can be found in the Care Plan section)  Acute Rehab PT Goals Patient Stated Goal: return home    Frequency     Barriers to discharge        Co-evaluation  End of Session Equipment Utilized During Treatment: Oxygen Activity Tolerance: Patient tolerated treatment well Patient left: Other (comment) (in transport chair ready to d/c from [facility]) Nurse Communication: Mobility status         Time: 1400-1419 PT Time Calculation (min) (ACUTE ONLY): 19 min   Charges:   PT Evaluation $PT Eval Low Complexity: 1 Procedure     PT G CodesAlessandra Bevels Avari Nevares 07/06/2016, 2:26 PM Deborah Chalk, PT, DPT [phone]

## 2016-07-06 NOTE — Progress Notes (Signed)
Progress Note  Patient Name: Jared [name] Date of Encounter: 07/06/2016  Primary Cardiologist: Dr Tenny Craw  Subjective   No chest pain, dyspnea, palpitations, orthopnea, or dizziness.  Inpatient Medications    Scheduled Meds: . arformoterol  15 mcg Nebulization BID  . carvedilol  12.5 mg Oral BID WC  . enoxaparin (LOVENOX) injection  1.5 mg/kg Subcutaneous Q24H  . furosemide  40 mg Intravenous BID  . losartan  25 mg Oral Daily  . nicotine  14 mg Transdermal Daily  . polyethylene glycol  17 g Oral BID  . sodium chloride flush  3 mL Intravenous Q12H  . umeclidinium bromide  1 puff Inhalation Daily  . warfarin  7.5 mg Oral ONCE-1800  . warfarin   Does not apply Once  . Warfarin - Pharmacist Dosing Inpatient   Does not apply q1800   Continuous Infusions:  PRN Meds: acetaminophen **OR** acetaminophen, levalbuterol, traMADol   Vital Signs    Vitals:   07/05/16 1531 07/05/16 2020 07/05/16 2027 07/06/16 0757  BP: 118/70 105/66  119/86  Pulse: 88 87    Resp: 18 16  17   Temp: 97.3 F (36.3 C) 99.2 F (37.3 C)    TempSrc: Oral Oral    SpO2: 98% 96% 96% 94%  Weight:      Height:        Intake/Output Summary (Last 24 hours) at 07/06/16 0913 Last data filed at 07/06/16 0841  Gross per 24 hour  Intake              720 ml  Output             1350 ml  Net             -630 ml   Filed Weights   07/03/16 0558 07/04/16 0609 07/05/16 0516  Weight: 211 lb 11.2 oz (96 kg) 204 lb 6.4 oz (92.7 kg) 195 lb 3.2 oz (88.5 kg)    Telemetry    Atrial flutter with rates in the 80's-low 100 - Personally Reviewed  ECG    No new tracings  Physical Exam   GEN: Pleasant AA male, No acute distress.  Neck: No JVD Cardiac: RRR, no murmurs, rubs, or gallops.  Respiratory: Clear to auscultation bilaterally. GI: Soft, nontender, non-distended  MS:  No deformity. Trace left ankle edema Neuro:  AAOx3. Psych: Normal affect  Labs    Chemistry Recent Labs Lab 07/02/16 0520  07/03/16 0441  07/04/16 0711 07/05/16 0442 07/06/16 0446  NA 141 140  < > 143 140 141  K 5.4* 5.4*  < > 4.0 3.5 3.8  CL 104 99*  < > 93* 93* 93*  CO2 31 33*  < > 42* 40* 38*  GLUCOSE 168* 126*  < > 94 111* 101*  BUN 22* 25*  < > 20 13 12   CREATININE 1.28* 1.47*  < > 1.38* 1.08 1.11  CALCIUM 8.7* 8.9  < > 8.6* 8.5* 8.6*  PROT 5.8* 6.0*  --   --   --  5.5*  ALBUMIN 3.4* 3.4*  --   --   --  2.9*  AST 95* 56*  --   --   --  36  ALT 130* 103*  --   --   --  58  ALKPHOS 214* 183*  --   --   --  130*  BILITOT 1.1 0.6  --   --   --  1.0  GFRNONAA 58* 49*  < > 53* >60 >  60  GFRAA >60 57*  < > >60 >60 >60  ANIONGAP 6 8  < > 8 7 10   < > = values in this interval not displayed.   Hematology Recent Labs Lab 07/04/16 0711 07/05/16 0442 07/06/16 0446  WBC 9.1 8.3 6.0  RBC 4.95 4.95 5.02  HGB 15.2 15.1 15.2  HCT 48.5 47.6 48.7  MCV 98.0 96.2 97.0  MCH 30.7 30.5 30.3  MCHC 31.3 31.7 31.2  RDW 14.9 14.5 14.3  PLT 192 209 192    Cardiac EnzymesNo results for input(s): TROPONINI in the last 168 hours.  Recent Labs Lab 07/01/16 2022  TROPIPOC 0.03     BNP Recent Labs Lab 07/01/16 2019  BNP 728.8*     DDimer No results for input(s): DDIMER in the last 168 hours.   Radiology    No results found.  Cardiac Studies   07/04/2016 ECHO Study Conclusions - HPI and indications: Atrial flutter 427.32. - Left ventricle: The cavity size was normal. Wall thickness was   increased in a pattern of moderate LVH. Systolic function was   moderately to severely reduced. The estimated ejection fraction   was in the range of 30% to 35%. Diffuse hypokinesis. The study is   not technically sufficient to allow evaluation of LV diastolic   function. - Aortic valve: Trileaflet. Sclerosis without stenosis. There was   no regurgitation. - Mitral valve: Calcified annulus. Mildly thickened leaflets .   There was trivial regurgitation. - Left atrium: Moderately dilated. - Right ventricle: The  cavity size was mildly dilated. Systolic   function is mildly reduced. - Right atrium: Moderately dilated. - Tricuspid valve: There was mild regurgitation. - Pulmonary arteries: PA peak pressure: 35 mm Hg (S). - Inferior vena cava: The vessel was dilated. The respirophasic   diameter changes were blunted (< 50%), consistent with elevated   central venous pressure. - Pericardium, extracardiac: A small pericardial effusion was   identified posterior to the heart. Tamponade features cannot be   excluded- clinical correlation is recommended.  Impressions: - Compared to a prior study in 01/2016, the LVEF is slighly worse at   30-35%. there is a small pericardial effusion with a dilated IVC   which is new. Clinical correlation for tamponade features is   always recommended.  Patient Profile     61 y.o. male  with CHF and PAF, nonischemic cardiomyopathy, mild nonobstructive CAD at cardiac catheterization in 2012, prior CVA, HTN, tobacco abuse. Non compliant with meds. EF 30-35%   Assessment & Plan    Atrial fib/flutter -Rate control with carvedilol 12.5 mg bid, well controlled. -This patients CHA2DS2-VASc Score is at elast 5 (CHF, HTN, vascular disease, CVA (2)) and unadjusted Ischemic Stroke Rate (% per year) is equal to 7.2 % stroke rate/year from a score of 5. Unable to afford DOAC. Warfarin has been initiated with Lovenox bridge. Home lovenox teaching has been done and he says that he will follow up on warfarin testing and dose adjustments.  -Pt has a history of noncompliance with medications. Discussed this and he agrees to follow prescribed medical plan and notify our office is he has trouble obtaining his medications.   Acute on chronic systolic heart failure -EF 30-35%, down form 35-40%, moderately dilated LA, worsened since pt being off of his medications.  -Felt to be NICM, had mild non obst CAD by cath in 2012. This admission, BNP mildly elevated to 728.8. Chest x-ray with no overt  pulmonary vascular congestion. -Diuresed with  lasix. Weight down 27 pounds since admission. I&O negative 10L. Edema has improved. Breathing is significantly improved. Had significant orhtopnea prior to admission, now able to lay flat. Pt taught to recognize orthopnea and report to the cardiology office. -Pt had stopped Entresto due to cost. Will stick to generics. Had cough with ACE-I. Losartan 25 mg daily started. BP well controlled. -Had trouble obtaining spironolactone in the past. -Recommend home with lasix 40 mg bid and will arrange follow up in the office.   Plan for discharge pending cardiologist rounds  Signed, Berton Bon, NP  07/06/2016, 9:13 AM     CHF cleared Atrial flutter rate controlled Have arranged f/u of INR on Wendsday at our coumadin clinic Ok to d/c home  Charlton Haws

## 2016-07-06 NOTE — Progress Notes (Signed)
SATURATION QUALIFICATIONS: (This note is used to comply with regulatory documentation for home oxygen)  Patient Saturations on Room Air at Rest = 90%  Patient Saturations on Room Air while Ambulating = 84-86%  Patient Saturations on 1 Liters of oxygen while Ambulating = 90%  Pt walked 150 feet

## 2016-07-09 ENCOUNTER — Telehealth: Payer: Self-pay

## 2016-07-09 NOTE — Telephone Encounter (Signed)
Patient contacted regarding discharge from [facility] on 07/06/16.  Patient understands to follow up with [facility] 07/11/16 at 9:30AM, Tereso Newcomer, PA 07/13/16 at 10:15 AM at [facility]. Patient understands discharge instructions? yes Patient understands medications and regiment? yes Patient understands to bring all medications to this visit? yes

## 2016-07-09 NOTE — Telephone Encounter (Signed)
Transitional Care Clinic Post-discharge Follow-Up Phone Call:  Date of Discharge: 07/06/16 Principal Discharge Diagnosis(es): Acute on chronic systolic and diastolic CHF, new onset atrial flutter, COPD Post-discharge Communication: This Case Manager placed call to patient to discuss the case management and medical management services available with the Transitional Care Clinic at [facility]. Patient agreeable to follow-up with the Transitional Care Clinic at [facility]. Call Completed: Yes                    With Whom: Patient    Please check all that apply:  X  Patient is knowledgeable of his/her condition(s) and/or treatment. X  Patient is caring for self at home.  ? Patient is receiving assist at home from family and/or caregiver. Family and/or caregiver is knowledgeable of patient's condition(s) and/or treatment. ? Patient is receiving home health services. If so, name of agency.     Medication Reconciliation:  X  Medication list reviewed with patient. X  Patient obtained all discharge medications-NO. Patient's discharge medications thoroughly reviewed. Patient has all discharge medications except nicotine patches. Patient wanting to quit smoking. Discussed importance of picking up nicotine patches as soon as possible, and also stressed importance of patient not smoking especially now that he uses oxygen. Patient verbalized understanding. Also informed patient he should STOP taking Zebeta (5 mg tablet). Patient verbalized understanding.  Activities of Daily Living:  X  Independent ? Needs assist  ? Total Care    Community resources in place for patient:  X  Home oxygen-2L (Oxygen supplier is [facility]) ? Home Health ? Assisted Living ? Support Group  Patient Education:  Discussed importance of daily weights. Encouraged patient to weigh himself each morning and to begin keeping record of daily weights. Instructed  patient to contact Cardiologist or PCP if his weight increases 3 lbs or more in a day or 5 lbs or more in a week. Patient verbalized understanding.        Questions/Concerns discussed: This Case Manager inquired about patient's status. Patient indicated he is "feeling better." Patient indicated his shortness of breath is improving. Reminded patient of his Coumadin Clinic appointment on 07/11/16 at 0930 and and appointment on 07/13/16 at 1015 with Dr. Alben Spittle. Transitional Care Clinic appointment scheduled for 07/13/16 at 1400 with Dr. Venetia Night. Patient appreciative of appointment.   Assessment:       Home Environment: Patient lives alone.       Support System: family members       Level of functioning: Independent       Home DME: home oxygen at 2L ([facility] is oxygen supplier).       Home care services: none       Transportation: Patient indicates his friends or family members typically take him to his medical appointments.         Food/Nutrition: Patient receives $121 of food stamps per month. Patient indicates he often does not have enough food and visits food pantries for additional food when needed. Patient would benefit from meeting with Social Worker at upcoming visit on 07/13/16 at 1400 with Dr. Venetia Night for additional food resources.        Medications: Patient gets his medications from the pharmacy at [facility] and [facility]. Patient denies problems affording or obtaining needed medications.        Identified Barriers: additional food resources needed. Will notify Jenel Lucks, SW at [facility]  PCP : Dr. [facility]

## 2016-07-11 ENCOUNTER — Ambulatory Visit (INDEPENDENT_AMBULATORY_CARE_PROVIDER_SITE_OTHER): Payer: Self-pay | Admitting: *Deleted

## 2016-07-11 DIAGNOSIS — Z5181 Encounter for therapeutic drug level monitoring: Secondary | ICD-10-CM

## 2016-07-11 DIAGNOSIS — I483 Typical atrial flutter: Secondary | ICD-10-CM

## 2016-07-11 LAB — POCT INR: INR: 2.3

## 2016-07-11 NOTE — Addendum Note (Signed)
Addended by: Jeannine Kitten on: 07/11/2016 09:51 AM   Modules accepted: Orders

## 2016-07-11 NOTE — Patient Instructions (Signed)

## 2016-07-12 ENCOUNTER — Telehealth: Payer: Self-pay

## 2016-07-12 NOTE — Telephone Encounter (Signed)
Call placed to the patient and confirmed his appointment at the TCC at [facility] tomorrow, 07/13/16 @ 1400. He also confirmed that he has an appointment with cardiology at 1015 and he has transportation to his appointments.  He said that he went to have his INR done yesterday and does not have any questions about his coumadin orders. He denied any signs of bleeding. He stated that he is weighing himself daily but has not been keeping a log of his weights. Encouraged him to keep the log to track his weight day to day. He stated that his weight has been steady at 184 lbs.  He noted that he has not been using his O2 continuously, only as needed.  He stated that he is feeling " okay" and did not have any questions/concerns at this time.

## 2016-07-13 ENCOUNTER — Telehealth: Payer: Self-pay | Admitting: *Deleted

## 2016-07-13 ENCOUNTER — Encounter (INDEPENDENT_AMBULATORY_CARE_PROVIDER_SITE_OTHER): Payer: Self-pay

## 2016-07-13 ENCOUNTER — Ambulatory Visit (INDEPENDENT_AMBULATORY_CARE_PROVIDER_SITE_OTHER): Payer: Self-pay | Admitting: Physician Assistant

## 2016-07-13 ENCOUNTER — Encounter: Payer: Self-pay | Admitting: Physician Assistant

## 2016-07-13 ENCOUNTER — Inpatient Hospital Stay: Payer: Medicaid Other | Admitting: Family Medicine

## 2016-07-13 ENCOUNTER — Encounter: Payer: Self-pay | Admitting: Licensed Clinical Social Worker

## 2016-07-13 ENCOUNTER — Encounter: Payer: Self-pay | Admitting: Family Medicine

## 2016-07-13 ENCOUNTER — Ambulatory Visit: Payer: Self-pay | Attending: Family Medicine | Admitting: Family Medicine

## 2016-07-13 VITALS — BP 78/47 | HR 123 | Temp 98.3°F | Ht 75.0 in | Wt 202.8 lb

## 2016-07-13 VITALS — BP 124/70 | HR 72 | Temp 98.0°F | Ht 75.0 in | Wt 201.0 lb

## 2016-07-13 DIAGNOSIS — I5042 Chronic combined systolic (congestive) and diastolic (congestive) heart failure: Secondary | ICD-10-CM | POA: Insufficient documentation

## 2016-07-13 DIAGNOSIS — I2510 Atherosclerotic heart disease of native coronary artery without angina pectoris: Secondary | ICD-10-CM | POA: Insufficient documentation

## 2016-07-13 DIAGNOSIS — I429 Cardiomyopathy, unspecified: Secondary | ICD-10-CM

## 2016-07-13 DIAGNOSIS — I420 Dilated cardiomyopathy: Secondary | ICD-10-CM

## 2016-07-13 DIAGNOSIS — I10 Essential (primary) hypertension: Secondary | ICD-10-CM

## 2016-07-13 DIAGNOSIS — I483 Typical atrial flutter: Secondary | ICD-10-CM

## 2016-07-13 DIAGNOSIS — I9589 Other hypotension: Secondary | ICD-10-CM | POA: Insufficient documentation

## 2016-07-13 DIAGNOSIS — R911 Solitary pulmonary nodule: Secondary | ICD-10-CM

## 2016-07-13 DIAGNOSIS — Z7901 Long term (current) use of anticoagulants: Secondary | ICD-10-CM | POA: Insufficient documentation

## 2016-07-13 DIAGNOSIS — J449 Chronic obstructive pulmonary disease, unspecified: Secondary | ICD-10-CM | POA: Insufficient documentation

## 2016-07-13 DIAGNOSIS — K761 Chronic passive congestion of liver: Secondary | ICD-10-CM

## 2016-07-13 DIAGNOSIS — Z720 Tobacco use: Secondary | ICD-10-CM

## 2016-07-13 DIAGNOSIS — Z8673 Personal history of transient ischemic attack (TIA), and cerebral infarction without residual deficits: Secondary | ICD-10-CM | POA: Insufficient documentation

## 2016-07-13 DIAGNOSIS — I5022 Chronic systolic (congestive) heart failure: Secondary | ICD-10-CM

## 2016-07-13 DIAGNOSIS — I110 Hypertensive heart disease with heart failure: Secondary | ICD-10-CM | POA: Insufficient documentation

## 2016-07-13 DIAGNOSIS — Z79899 Other long term (current) drug therapy: Secondary | ICD-10-CM | POA: Insufficient documentation

## 2016-07-13 DIAGNOSIS — Z888 Allergy status to other drugs, medicaments and biological substances status: Secondary | ICD-10-CM | POA: Insufficient documentation

## 2016-07-13 DIAGNOSIS — R000 Tachycardia, unspecified: Secondary | ICD-10-CM | POA: Insufficient documentation

## 2016-07-13 DIAGNOSIS — Z9889 Other specified postprocedural states: Secondary | ICD-10-CM | POA: Insufficient documentation

## 2016-07-13 LAB — BASIC METABOLIC PANEL
BUN/Creatinine Ratio: 15 (ref 10–24)
BUN: 19 mg/dL (ref 8–27)
CO2: 30 mmol/L — ABNORMAL HIGH (ref 18–29)
Calcium: 9 mg/dL (ref 8.6–10.2)
Chloride: 102 mmol/L (ref 96–106)
Creatinine, Ser: 1.28 mg/dL — ABNORMAL HIGH (ref 0.76–1.27)
GFR calc Af Amer: 69 mL/min/{1.73_m2} (ref >59)
GFR calc non Af Amer: 60 mL/min/{1.73_m2} (ref >59)
Glucose: 71 mg/dL (ref 65–99)
Potassium: 4.4 mmol/L (ref 3.5–5.2)
Sodium: 146 mmol/L — ABNORMAL HIGH (ref 134–144)

## 2016-07-13 MED ORDER — FUROSEMIDE 40 MG PO TABS: 60.0000 mg | ORAL_TABLET | Freq: Every day | ORAL | Status: DC

## 2016-07-13 MED ORDER — CARVEDILOL 12.5 MG PO TABS: 18.7500 mg | ORAL_TABLET | Freq: Two times a day (BID) | ORAL | 3 refills | Status: DC

## 2016-07-13 MED FILL — CARVEDILOL 12.5 MG TABLET: 12.5 | 30 days supply | Qty: 90 | Fill #0

## 2016-07-13 NOTE — Progress Notes (Signed)
Transition care clinic  Subjective:  Patient ID: Jared [name], male    DOB: 07/17/1955  Age: 61 y.o. MRN: [id number]  CC: COPD; Hypertension; and Congestive Heart Failure   HPI Jared [name] is a 61 year old male with a history of hypertension, COPD, tobacco abuse, NICM , CHF (EF 30-35% from 2-D echo 06/2016).  He had presented with worsening dyspnea,pedal edema after running out of his medications. Commenced on IV diuretic; also found to develop new onset atrial flutter and was placed on IV digoxin and Cardizem as well as anticoagulation with Lovenox . He had elevated LFTs and abdominal ultrasound revealed Coarsened echotexture of the liver with slight surface nodularity suggesting cirrhosis. There may be mild sympathetic thickening from of the gallbladder given trace ascites noted in the right upper quadrant. Some of this may also be due to underdistention. No cholelithiasis. Chronic cholecystitis is not entirely excluded. He was commenced on oxygen due to low oxygen saturation and was transitioned to Coumadin prior to discharge.  He had a follow-up visit with cardiology today where his carvedilol dose was increased from 12.5 mg to 18.75 mg on his blood pressure is 78/47 and he is yet to take the new dose of carvedilol. He uses his oxygen as needed. Denies any acute concerns at this time.  Past Medical History:  Diagnosis Date  . CAD (coronary artery disease)    a. LHC 5/12:  LAD 20, pLCx 20, pRCA 40, dRCA 40, EF 35%, diff HK  //  b. Myoview 4/16: Overall Impression: High risk stress nuclear study There is no evidence of ischemia. There is severe LV dysfunction. LV Ejection Fraction: 30%. LV Wall Motion: There is global LV hypokinesis.   Marland Kitchen CAP (community acquired pneumonia) 09/2013  . Chronic combined systolic and diastolic CHF (congestive heart failure) (HCC)    a. Echo 4/16:Mild LVH, EF 40-45%, diffuse HK //  b. Echo 8/17: EF 35-40%, diffuse HK, diastolic dysfunction, aortic  sclerosis, trivial MR, moderate LAE, normal RVSF, moderate RAE, mild TR, PASP 42 mmHg  . Cluster headache    "hx; haven't had one in awhile" (01/09/2016)  . COPD (chronic obstructive pulmonary disease) (HCC)    [name] 01/09/2016  . History of CVA (cerebrovascular accident)   . Hypertension   . NICM (nonischemic cardiomyopathy) (HCC)   . Tobacco abuse     Past Surgical History:  Procedure Laterality Date  . CARDIAC CATHETERIZATION  10/2010   LM normal, LAD with 20% irregularities, LCX with 20%, RCA with 40% prox and 40% distal - EF of 35%  . COLONOSCOPY W/ BIOPSIES AND POLYPECTOMY    . VIDEO BRONCHOSCOPY Bilateral 05/08/2016   Procedure: VIDEO BRONCHOSCOPY WITH FLUORO;  Surgeon: Oretha Milch, MD;  Location: [facility] ENDOSCOPY;  Service: Cardiopulmonary;  Laterality: Bilateral;    Allergies  Allergen Reactions  . Lisinopril Cough     Outpatient Medications Prior to Visit  Medication Sig Dispense Refill  . albuterol (PROVENTIL HFA;VENTOLIN HFA) 108 (90 Base) MCG/ACT inhaler Inhale 2 puffs into the lungs every 6 (six) hours as needed for wheezing or shortness of breath. 1 Inhaler 11  . carvedilol (COREG) 12.5 MG tablet Take 1.5 tablets (18.75 mg total) by mouth 2 (two) times daily. 270 tablet 3  . furosemide (LASIX) 40 MG tablet Take 1 tablet (40 mg total) by mouth 2 (two) times daily. 60 tablet 0  . Glycopyrrolate-Formoterol (BEVESPI AEROSPHERE) 9-4.8 MCG/ACT AERO Inhale 2 puffs into the lungs 2 (two) times daily.    Marland Kitchen losartan (COZAAR)  25 MG tablet Take 1 tablet (25 mg total) by mouth daily. 30 tablet 0  . potassium chloride SA (K-DUR,KLOR-CON) 10 MEQ tablet Take 1 tablet (10 mEq total) by mouth daily. 30 tablet 0  . warfarin (COUMADIN) 7.5 MG tablet Take 1 tablet (7.5 mg total) by mouth one time only at 6 PM. 30 tablet 0   No facility-administered medications prior to visit.     ROS Review of Systems  Constitutional: Negative for activity change and appetite change.  HENT: Negative  for sinus pressure and sore throat.   Eyes: Negative for visual disturbance.  Respiratory: Negative for cough, chest tightness and shortness of breath.   Cardiovascular: Negative for chest pain and leg swelling.  Gastrointestinal: Negative for abdominal distention, abdominal pain, constipation and diarrhea.  Endocrine: Negative.   Genitourinary: Negative for dysuria.  Musculoskeletal: Negative for joint swelling and myalgias.  Skin: Negative for rash.  Allergic/Immunologic: Negative.   Neurological: Negative for weakness, light-headedness and numbness.  Psychiatric/Behavioral: Negative for dysphoric mood and suicidal ideas.    Objective:  BP (!) 78/47 (BP Location: Right Arm, Patient Position: Sitting, Cuff Size: Large)   Pulse (!) 123   Temp 98.3 F (36.8 C) (Oral)   Ht 6\' 3"  (1.905 m)   Wt 202 lb 12.8 oz (92 kg)   SpO2 90%   BMI 25.35 kg/m   BP/Weight 07/13/2016 07/13/2016 07/06/2016  Systolic BP 78 124 113  Diastolic BP 47 70 65  Wt. (Lbs) 202.8 201 -  BMI 25.35 25.12 -      Physical Exam  Constitutional: He is oriented to person, place, and time. He appears well-developed and well-nourished.  Neck: No JVD present.  Cardiovascular: Normal heart sounds and intact distal pulses.  Tachycardia present.   No murmur heard. Pulmonary/Chest: Effort normal. He has no wheezes. He has no rales. He exhibits no tenderness.  Distant breath sounds  Abdominal: Soft. Bowel sounds are normal. He exhibits no distension and no mass. There is no tenderness.  Musculoskeletal: Normal range of motion.  Neurological: He is alert and oriented to person, place, and time.     Assessment & Plan:   1. Nonischemic dilated cardiomyopathy (HCC) Asymptomatic at this time  2. COPD GOLD III/still smoking  Advised that smoking cessation will help retard progression of condition  3. Chronic systolic CHF (congestive heart failure) (HCC) EF 30-35% Euvolemic On beta blocker and ARB Daily weight  checks  4. Typical atrial flutter (HCC) New onset atrial flutter Anticoagulation with Coumadin He is still tachycardic- May benefit from metoprolol or Diltiazem as opposed to carvedilol but will defer to cardiology. Plan is for DCCV after 3-4 weeks once INR is >2  5. Other specified hypotension His blood pressure is currently 78/47 I have advised him to hold off on the increased dose of carvedilol and keep a blood pressure log He is to get in touch with his cardiologist next week to report his blood pressures   6. Abnormal ultrasound finding ? Cirrhosis vs congestive hepatopathy We'll plan to the repeating abdominal ultrasound in 3 months I have encouraged quitting alcohol  No orders of the defined types were placed in this encounter.   Follow-up: Return in about 3 weeks (around 08/03/2016) for TCC : follow up on Hypotension.   Jaclyn Shaggy MD

## 2016-07-13 NOTE — Progress Notes (Signed)
Saw cardiologist this morning and he has increased the carvedilol.

## 2016-07-13 NOTE — Progress Notes (Signed)
Cardiology Office Note:    Date:  07/13/2016   ID:  Jared [name], DOB 01-15-1956, MRN [id number]  PCP:  Jaclyn Shaggy, MD  Cardiologist:  Dr. Dietrich Pates  Electrophysiologist:  n/a Pulmonology: Dr. [name]  Referring MD: Jaclyn Shaggy, MD   Chief Complaint  Patient presents with  . Hospitalization Follow-up    admx with CHF and Aflutter    History of Present Illness:    Jared [name] is a 61 y.o. male with a hx of  combined systolic and diastolic CHF, nonischemic cardiomyopathy with EF ~35%, mild nonobstructive CAD at cardiac catheterization in 2012, prior CVA, HTN, tobacco abuse, COPD.   Last seen in clinic by me in 9/17.  He was admitted 1/21-1/26 with acute on chronic combined systolic and diastolic CHF.  Patient had lost his insurance and was off most of his meds (only taking Bystolic and potassium).  Hospitalization was complicated by new onset atrial flutter.  Echocardiogram demonstrated EF 30-35%, moderate BAE and mild elevation in pulmonary pressures. He r/o for myocardial infarction.  He did have elevated LFTs thought to be 2/2 hepatic congestion.  But, ultrasound did show some changes consistent with cirrhosis.  He was seen by Cardiology.  Patient was placed on Coumadin (cost of DOACs felt to be prohibitive).  Rate was controlled at DC. He was DC on Lovenox >> Coumadin bridge.      CHADS2-VASc=5 (CHF, CVA, HTN, vascular dz).     He returns for post hospitalization follow up.  He is here alone.  Overall, he is doing well.  He notes his breathing is improved.  He denies orthopnea but still notes PND.  He denies syncope, chest pain. He has noted some rapid palpitations but theses last just seconds.  His weights have been stable. He denies edema.  He has a chronic cough with assoc wheezing.  This is no worse.  He denies any melena or hematochezia.   Prior CV studies that were reviewed today include:    Echo 07/04/16  Moderate LVH, EF 30-35, diffuse HK aortic sclerosis, trivial MR,  moderate LAE, mildly reduced RVSF, moderate RAE, mild TR, PASP 35, small pericardial effusion  Echo 01/10/16 EF 35-40%, diffuse HK, diastolic dysfunction, aortic sclerosis, trivial MR, moderate LAE, normal RVSF, moderate RAE, mild TR, PASP 42 mmHg  Myoview 4/16 Overall Impression: High risk stress nuclear study There is no evidence of ischemia. There is severe LV dysfunction. LV Ejection Fraction: 30%. LV Wall Motion: There is global LV hypokinesis.   Echo 4/16 Mild LVH, EF 40-45%, diffuse HK  Cardiac cath(10/11/2010) Done for [facility]) Known cardiomyopathy prior. LM normal  LAD 20% irregularities LCX 20% proximal RCA: Dominant; 40% proximal, 40% distal LVEF 35% with diffuse hypokinesis.  Past Medical History:  Diagnosis Date  . CAD (coronary artery disease)    a. LHC 5/12:  LAD 20, pLCx 20, pRCA 40, dRCA 40, EF 35%, diff HK  //  b. Myoview 4/16: Overall Impression: High risk stress nuclear study There is no evidence of ischemia. There is severe LV dysfunction. LV Ejection Fraction: 30%. LV Wall Motion: There is global LV hypokinesis.   Marland Kitchen CAP (community acquired pneumonia) 09/2013  . Chronic combined systolic and diastolic CHF (congestive heart failure) (HCC)    a. Echo 4/16:Mild LVH, EF 40-45%, diffuse HK //  b. Echo 8/17: EF 35-40%, diffuse HK, diastolic dysfunction, aortic sclerosis, trivial MR, moderate LAE, normal RVSF, moderate RAE, mild TR, PASP 42 mmHg  . Cluster headache    "  hx; haven't had one in awhile" (01/09/2016)  . COPD (chronic obstructive pulmonary disease) (HCC)    [name] 01/09/2016  . History of CVA (cerebrovascular accident)   . Hypertension   . NICM (nonischemic cardiomyopathy) (HCC)   . Tobacco abuse     Past Surgical History:  Procedure Laterality Date  . CARDIAC CATHETERIZATION  10/2010   LM normal, LAD with 20% irregularities, LCX with 20%, RCA with 40% prox and 40% distal - EF of 35%  . COLONOSCOPY W/ BIOPSIES  AND POLYPECTOMY    . VIDEO BRONCHOSCOPY Bilateral 05/08/2016   Procedure: VIDEO BRONCHOSCOPY WITH FLUORO;  Surgeon: Oretha Milch, MD;  Location: [facility] ENDOSCOPY;  Service: Cardiopulmonary;  Laterality: Bilateral;    Current Medications: Current Meds  Medication Sig  . albuterol (PROVENTIL HFA;VENTOLIN HFA) 108 (90 Base) MCG/ACT inhaler Inhale 2 puffs into the lungs every 6 (six) hours as needed for wheezing or shortness of breath.  . furosemide (LASIX) 40 MG tablet Take 1 tablet (40 mg total) by mouth 2 (two) times daily.  . Glycopyrrolate-Formoterol (BEVESPI AEROSPHERE) 9-4.8 MCG/ACT AERO Inhale 2 puffs into the lungs 2 (two) times daily.  Marland Kitchen losartan (COZAAR) 25 MG tablet Take 1 tablet (25 mg total) by mouth daily.  . potassium chloride SA (K-DUR,KLOR-CON) 10 MEQ tablet Take 1 tablet (10 mEq total) by mouth daily.  Marland Kitchen warfarin (COUMADIN) 7.5 MG tablet Take 1 tablet (7.5 mg total) by mouth one time only at 6 PM.  . [DISCONTINUED] carvedilol (COREG) 12.5 MG tablet Take 1 tablet (12.5 mg total) by mouth 2 (two) times daily with a meal.     Allergies:   Lisinopril   Social History   Social History  . Marital status: Single    Spouse name: N/A  . Number of children: N/A  . Years of education: N/A   Social History Main Topics  . Smoking status: Current Every Day Smoker    Packs/day: 0.50    Years: 42.00    Types: Cigarettes  . Smokeless tobacco: Never Used  . Alcohol use 3.6 oz/week    6 Glasses of wine per week     Comment: weekends  . Drug use: No     Comment: "nothing in 20 years"  . Sexual activity: Not Currently   Other Topics Concern  . None   Social History Narrative   unemployed        Family History:  The patient's family history includes Cancer in his father and mother; Diabetes in his brother, father, mother, and sister; Heart disease in his mother.   ROS:   Please see the history of present illness.    Review of Systems  Constitution: Positive for  malaise/fatigue and weight loss.  Cardiovascular: Positive for chest pain, dyspnea on exertion, irregular heartbeat and leg swelling.  Respiratory: Positive for shortness of breath and wheezing.   Gastrointestinal: Positive for abdominal pain.  Neurological: Positive for headaches.   All other systems reviewed and are negative.   EKGs/Labs/Other Test Reviewed:    EKG:  EKG is  ordered today.  The ekg ordered today demonstrates AFlutter, HR 101, variable AV block  Recent Labs: 07/01/2016: B Natriuretic Peptide 728.8 07/03/2016: Magnesium 1.7 07/04/2016: TSH 1.268 07/06/2016: ALT 58; BUN 12; Creatinine, Ser 1.11; Hemoglobin 15.2; Platelets 192; Potassium 3.8; Sodium 141   Recent Lipid Panel    Component Value Date/Time   CHOL 147 01/11/2016 0446   TRIG 69 01/11/2016 0446   HDL 53 01/11/2016 0446   CHOLHDL  2.8 01/11/2016 0446   VLDL 14 01/11/2016 0446   LDLCALC 80 01/11/2016 0446   Abdominal US 06/28/16 IMPRESSION: Coarsened echotexture of the liver with slight surface nodularity suggesting cirrhosis. There may be mild sympathetic thickening from of the gallbladder given trace ascites noted in the right upper quadrant. Some of this may also be due to underdistention. No cholelithiasis. Chronic cholecystitis is not entirely excluded.  Chest CT 06/26/16 IMPRESSION: 1. Diffuse bronchial wall thickening with emphysema, as above; imaging findings suggestive of underlying COPD. 2. Aortic atherosclerosis and 3 vessel coronary artery calcification 3. Small right pleural effusion 4. 7 mm subpleural nodule is stable from 02/20/2016. Future CT at 18-24 months (from 02/20/2016) is considered optional for low-risk patients, but is recommended for high-risk patients. This recommendation follows the consensus statement: Guidelines for Management of Incidental Pulmonary Nodules Detected on CT Images:From the Fleischner Society 2017; published online before print  (10.1148/radiol.1610960454).  Physical Exam:    VS:  BP 124/70   Pulse 72   Temp 98 F (36.7 C)   Ht 6\' 3"  (1.905 m)   Wt 201 lb (91.2 kg)   BMI 25.12 kg/m     Wt Readings from Last 3 Encounters:  07/13/16 201 lb (91.2 kg)  07/05/16 195 lb 3.2 oz (88.5 kg)  06/26/16 213 lb (96.6 kg)     Physical Exam  Constitutional: He is oriented to person, place, and time. He appears well-developed and well-nourished. No distress.  HENT:  Head: Normocephalic and atraumatic.  Eyes: No scleral icterus.  Neck: Normal range of motion. No JVD present.  Cardiovascular: Regular rhythm, S1 normal and S2 normal.  Tachycardia present.   No murmur heard. Pulmonary/Chest: Effort normal. He has decreased breath sounds. He has no wheezes. He has no rhonchi. He has no rales.  Abdominal: Soft. There is no tenderness.  Musculoskeletal: He exhibits no edema.  Neurological: He is alert and oriented to person, place, and time.  Skin: Skin is warm and dry.  Psychiatric: He has a normal mood and affect.    ASSESSMENT:    1. Typical atrial flutter (HCC)   2. Chronic combined systolic and diastolic CHF (congestive heart failure) (HCC)   3. Nonischemic dilated cardiomyopathy (HCC)   4. Essential hypertension   5. Coronary artery disease involving native coronary artery of native heart without angina pectoris   6. Tobacco abuse   7. Lung nodule   8. Hepatic congestion    PLAN:    In order of problems listed above:  1. Atrial flutter - He remains in AFlutter.  HR ~ 100.  He notes some rapid palpitations at times.  He is adherent with carvedilol.  He is on Coumadin.  INR was therapeutic 1/31.  Will tentatively aim for DCCV after 3-4 weeks of INR > 2.    -  Increase Coreg to 18.75 bid   -  FU with CVRR Q week to check INR  -  FU 3 weeks and plan DCCV if still in AFlutter.  2. Chronic combined systolic and diastolic CHF - Volume stable.  Continue current dose of diuretic.  Continue beta-blocker,  angiotensin receptor blocker.  Consider adding spironolactone at some point if blood pressure will allow.  Check BMET today.  3. NICM - EF 30-35%.  Continue beta-blocker, angiotensin receptor blocker.  Add aldo antagonist if BP will allow.  Would also like to get him back on Entresto if we can get it filled at his pharmacy.  Plan follow up echo ~  3 months.  Refer to EP if EF < 35.  4. HTN - BP controlled.   5. CAD - Mild CAD at prior cath. No angina.  He is not on ASA as he is on Coumadin.  Eventually try to start statin Rx.  Cost of medications has limited adherence in the past and he had cirrhotic changes on his ultrasound in the hospital.    6. Tobacco abuse - He is trying to quit.   7. Lung nodule - He will need to continue follow up with Pulmonology.   8. Hepatic congestion - Questionable changes of cirrhosis on recent ultrasound.  This may have all been from hepatic congestion.  He will need to continue follow up with PCP.  He has an appointment later today.   Medication Adjustments/Labs and Tests Ordered: Current medicines are reviewed at length with the patient today.  Concerns regarding medicines are outlined above.  Medication changes, Labs and Tests ordered today are outlined in the Patient Instructions noted below. Patient Instructions  Medication Instructions:  1. INCREASE COREG TO 18.75 MG TWICE DAILY (THIS WILL BE 1 AND 1/2 TABS TWICE A DAY)  Labwork: TODAY BMET  Testing/Procedures: NONE  Follow-Up: 1. Shiloh Southern, PAC 3 WEEKS 2. YOU WILL NEED TO HAVE YOUR INR CHECKED WEEKLY PENDING A POSSIBLE CARDIOVERSION  Any Other Special Instructions Will Be Listed Below (If Applicable).  MONITOR BLOOD PRESSURE AND CALL IF YOUR BLOOD PRESSURE IS 100 OR LESS ON THE TOP NUMBER OR IF YOU FEEL BAD WITH THE INCREASED DOSE OF COREG ; [phone] Laura Caldas, PAC   If you need a refill on your cardiac medications before your next appointment, please call your  pharmacy.  Signed, Tereso Newcomer, PA-C  07/13/2016 11:12 AM    [facility] [address] [place], [place], [place]  [address] Phone: [phone]; Fax: [phone]

## 2016-07-13 NOTE — Patient Instructions (Addendum)
Medication Instructions:  1. INCREASE COREG TO 18.75 MG TWICE DAILY (THIS WILL BE 1 AND 1/2 TABS TWICE A DAY)  Labwork: TODAY BMET  Testing/Procedures: NONE  Follow-Up: 1. SCOTT WEAVER, PAC 3 WEEKS 2. YOU WILL NEED TO HAVE YOUR INR CHECKED WEEKLY PENDING A POSSIBLE CARDIOVERSION  Any Other Special Instructions Will Be Listed Below (If Applicable).  MONITOR BLOOD PRESSURE AND CALL IF YOUR BLOOD PRESSURE IS 100 OR LESS ON THE TOP NUMBER OR IF YOU FEEL BAD WITH THE INCREASED DOSE OF COREG ; [phone] SCOTT WEAVER, PAC   If you need a refill on your cardiac medications before your next appointment, please call your pharmacy.

## 2016-07-13 NOTE — BH Specialist Note (Signed)
Session Start time: 2:30 PM   End Time: 2:50 PM Total Time:  20 minutes Type of Service: Behavioral Health - Individual/Family Interpreter: No.   Interpreter Name & Language: N.A # [facility] Visits July 2017-June 2018: 1st   SUBJECTIVE: Jared [name] is a 61 y.o. male  Pt. was referred by Dr. Venetia Night for:  community resources for transportation, financial strain, and food insecurity. Pt. reports the following symptoms/concerns: Pt reports needing assistance with transportation, food insecurity, and financial strain Duration of problem:  Ongoing Severity: mild Previous treatment: None reported   OBJECTIVE: Mood: Pleasant & Affect: Appropriate Risk of harm to self or others: Pt denied SI/HI Assessments administered: PHQ-9; GAD-7  LIFE CONTEXT:  Family & Social: Pt resides in stable housing. 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 Pt was unaware if he could benefit from [facility] transportation due to application pending. LCSWA discussed transportation options with medicaid and the application process of [employer]. Pt agreed to schedule an appointment with Financial Counseling, should his Medicaid application be denied. LCSWA provided community resources to address food insecurity. Pt was appreciative of information. He denied interest in initiating any behavioral health services.     PLAN: 1. F/U with behavioral health clinician: Pt was encouraged  to contact LCSWA if symptoms worsen or fail to improve to schedule behavioral appointments at [facility]. 2. Behavioral Health meds: None reported 3. Behavioral recommendations: LCSWA recommends that pt utilize community resources. Pt is encouraged to schedule follow up appointment with LCSWA, if needed 4. Referral: [employer] 5. From scale of 1-10, how likely are you to follow plan: 8/10   Bridgett Larsson, MSW, [address] [facility]  Clinical Social Worker 07/13/16 4:39 PM  Warmhandoff:   Warm Hand Off Completed.

## 2016-07-13 NOTE — Telephone Encounter (Signed)
Pt notified of lab results and findings by phone with verbal understanding to decrease lasix to 60 mg daily, bmet 2/7 when he comes in for CVRR appt. Pt aware to monitor weight and call the office [phone] if weight is up 3 or more lb's x 1 day or increased edema.

## 2016-07-18 ENCOUNTER — Ambulatory Visit (INDEPENDENT_AMBULATORY_CARE_PROVIDER_SITE_OTHER): Payer: Self-pay | Admitting: *Deleted

## 2016-07-18 ENCOUNTER — Other Ambulatory Visit: Payer: Self-pay | Admitting: *Deleted

## 2016-07-18 DIAGNOSIS — I483 Typical atrial flutter: Secondary | ICD-10-CM

## 2016-07-18 DIAGNOSIS — Z5181 Encounter for therapeutic drug level monitoring: Secondary | ICD-10-CM

## 2016-07-18 DIAGNOSIS — I5042 Chronic combined systolic (congestive) and diastolic (congestive) heart failure: Secondary | ICD-10-CM

## 2016-07-18 LAB — POCT INR: INR: 2.7

## 2016-07-19 ENCOUNTER — Encounter: Payer: Self-pay | Admitting: Physician Assistant

## 2016-07-19 ENCOUNTER — Other Ambulatory Visit: Payer: Self-pay

## 2016-07-19 ENCOUNTER — Ambulatory Visit: Payer: Self-pay | Attending: Physician Assistant | Admitting: Physician Assistant

## 2016-07-19 VITALS — BP 125/87 | HR 120 | Temp 99.3°F | Resp 24 | Wt 212.2 lb

## 2016-07-19 DIAGNOSIS — J441 Chronic obstructive pulmonary disease with (acute) exacerbation: Secondary | ICD-10-CM | POA: Insufficient documentation

## 2016-07-19 DIAGNOSIS — I429 Cardiomyopathy, unspecified: Secondary | ICD-10-CM | POA: Insufficient documentation

## 2016-07-19 DIAGNOSIS — R519 Headache, unspecified: Secondary | ICD-10-CM

## 2016-07-19 DIAGNOSIS — I959 Hypotension, unspecified: Secondary | ICD-10-CM | POA: Insufficient documentation

## 2016-07-19 DIAGNOSIS — I2510 Atherosclerotic heart disease of native coronary artery without angina pectoris: Secondary | ICD-10-CM | POA: Insufficient documentation

## 2016-07-19 DIAGNOSIS — I110 Hypertensive heart disease with heart failure: Secondary | ICD-10-CM | POA: Insufficient documentation

## 2016-07-19 DIAGNOSIS — Z79899 Other long term (current) drug therapy: Secondary | ICD-10-CM | POA: Insufficient documentation

## 2016-07-19 DIAGNOSIS — I5022 Chronic systolic (congestive) heart failure: Secondary | ICD-10-CM

## 2016-07-19 DIAGNOSIS — M7989 Other specified soft tissue disorders: Secondary | ICD-10-CM | POA: Insufficient documentation

## 2016-07-19 DIAGNOSIS — Z7901 Long term (current) use of anticoagulants: Secondary | ICD-10-CM | POA: Insufficient documentation

## 2016-07-19 DIAGNOSIS — R000 Tachycardia, unspecified: Secondary | ICD-10-CM | POA: Insufficient documentation

## 2016-07-19 DIAGNOSIS — R51 Headache: Secondary | ICD-10-CM | POA: Insufficient documentation

## 2016-07-19 DIAGNOSIS — Z8673 Personal history of transient ischemic attack (TIA), and cerebral infarction without residual deficits: Secondary | ICD-10-CM | POA: Insufficient documentation

## 2016-07-19 DIAGNOSIS — I5042 Chronic combined systolic (congestive) and diastolic (congestive) heart failure: Secondary | ICD-10-CM | POA: Insufficient documentation

## 2016-07-19 LAB — BASIC METABOLIC PANEL
BUN/Creatinine Ratio: 15 (ref 10–24)
BUN: 17 mg/dL (ref 8–27)
CO2: 30 mmol/L — ABNORMAL HIGH (ref 18–29)
Calcium: 8.7 mg/dL (ref 8.6–10.2)
Chloride: 101 mmol/L (ref 96–106)
Creatinine, Ser: 1.17 mg/dL (ref 0.76–1.27)
GFR calc Af Amer: 77 mL/min/{1.73_m2} (ref >59)
GFR calc non Af Amer: 66 mL/min/{1.73_m2} (ref >59)
Glucose: 99 mg/dL (ref 65–99)
Potassium: 4.3 mmol/L (ref 3.5–5.2)
Sodium: 145 mmol/L — ABNORMAL HIGH (ref 134–144)

## 2016-07-19 MED ORDER — FUROSEMIDE 40 MG PO TABS: 40.0000 mg | ORAL_TABLET | Freq: Two times a day (BID) | ORAL | 2 refills | Status: DC

## 2016-07-19 MED ORDER — ACETAMINOPHEN 500 MG PO TABS: 500.0000 mg | ORAL_TABLET | Freq: Four times a day (QID) | ORAL | 0 refills | Status: DC | PRN

## 2016-07-19 MED ORDER — CARVEDILOL 25 MG PO TABS: 25.0000 mg | ORAL_TABLET | Freq: Two times a day (BID) | ORAL | 2 refills | Status: DC

## 2016-07-19 MED ORDER — LOSARTAN POTASSIUM 25 MG PO TABS: ORAL_TABLET | ORAL | 0 refills | Status: DC

## 2016-07-19 MED ORDER — FUROSEMIDE 40 MG PO TABS: 20.0000 mg | ORAL_TABLET | Freq: Two times a day (BID) | ORAL | 2 refills | Status: DC

## 2016-07-19 NOTE — Patient Instructions (Addendum)
Please reduce Furosemide from 80mg  per day to 60mg  per day (Two tabs in the a.m. Then one tab in the p.m.) Your Carvedilol has been increased. Please return in 5 days for follow up. Go to emergency room or call ambulance if your headache persists or worsens.   General Headache Without Cause Introduction A headache is pain or discomfort felt around the head or neck area. There are many causes and types of headaches. In some cases, the cause may not be found. Follow these instructions at home: Managing pain  Take over-the-counter and prescription medicines only as told by your doctor.  Lie down in a dark, quiet room when you have a headache.  If directed, apply ice to the head and neck area:  Put ice in a plastic bag.  Place a towel between your skin and the bag.  Leave the ice on for 20 minutes, 2-3 times per day.  Use a heating pad or hot shower to apply heat to the head and neck area as told by your doctor.  Keep lights dim if bright lights bother you or make your headaches worse. Eating and drinking  Eat meals on a regular schedule.  Lessen how much alcohol you drink.  Lessen how much caffeine you drink, or stop drinking caffeine. General instructions  Keep all follow-up visits as told by your doctor. This is important.  Keep a journal to find out if certain things bring on headaches. For example, write down:  What you eat and drink.  How much sleep you get.  Any change to your diet or medicines.  Relax by getting a massage or doing other relaxing activities.  Lessen stress.  Sit up straight. Do not tighten (tense) your muscles.  Do not use tobacco products. This includes cigarettes, chewing tobacco, or e-cigarettes. If you need help quitting, ask your doctor.  Exercise regularly as told by your doctor.  Get enough sleep. This often means 7-9 hours of sleep. Contact a doctor if:  Your symptoms are not helped by medicine.  You have a headache that feels  different than the other headaches.  You feel sick to your stomach (nauseous) or you throw up (vomit).  You have a fever. Get help right away if:  Your headache becomes really bad.  You keep throwing up.  You have a stiff neck.  You have trouble seeing.  You have trouble speaking.  You have pain in the eye or ear.  Your muscles are weak or you lose muscle control.  You lose your balance or have trouble walking.  You feel like you will pass out (faint) or you pass out.  You have confusion. This information is not intended to replace advice given to you by your health care provider. Make sure you discuss any questions you have with your health care provider. Document Released: 03/06/2008 Document Revised: 11/03/2015 Document Reviewed: 09/20/2014  2017 Elsevier

## 2016-07-19 NOTE — Progress Notes (Signed)
BLE swelling SOB HA Sx's for 2 days

## 2016-07-19 NOTE — Progress Notes (Signed)
Patient ID: Jared [name], male   DOB: Jul 24, 1955, 61 y.o.   MRN: [id number]    Subjective:  Patient ID: Jared [name], male    DOB: [birth year]  Age: 61 y.o. MRN: [id number]  CC: No chief complaint on file.   HPI Jared [name] is a 61 y.o. male with a PMH of   Past Medical History:  Diagnosis Date  . CAD (coronary artery disease)    a. LHC 5/12:  LAD 20, pLCx 20, pRCA 40, dRCA 40, EF 35%, diff HK  //  b. Myoview 4/16: Overall Impression: High risk stress nuclear study There is no evidence of ischemia. There is severe LV dysfunction. LV Ejection Fraction: 30%. LV Wall Motion: There is global LV hypokinesis.   Marland Kitchen CAP (community acquired pneumonia) 09/2013  . Chronic combined systolic and diastolic CHF (congestive heart failure) (HCC)    a. Echo 4/16:Mild LVH, EF 40-45%, diffuse HK //  b. Echo 8/17: EF 35-40%, diffuse HK, diastolic dysfunction, aortic sclerosis, trivial MR, moderate LAE, normal RVSF, moderate RAE, mild TR, PASP 42 mmHg  . Cluster headache    "hx; haven't had one in awhile" (01/09/2016)  . COPD (chronic obstructive pulmonary disease) (HCC)    [name] 01/09/2016  . History of CVA (cerebrovascular accident)   . Hypertension   . NICM (nonischemic cardiomyopathy) (HCC)   . Tobacco abuse     that presents with a 2 day history of SOB, headache, and bilateral lower extremity swelling. He has recently     Outpatient Medications Prior to Visit  Medication Sig Dispense Refill  . albuterol (PROVENTIL HFA;VENTOLIN HFA) 108 (90 Base) MCG/ACT inhaler Inhale 2 puffs into the lungs every 6 (six) hours as needed for wheezing or shortness of breath. 1 Inhaler 11  . Glycopyrrolate-Formoterol (BEVESPI AEROSPHERE) 9-4.8 MCG/ACT AERO Inhale 2 puffs into the lungs 2 (two) times daily.    Marland Kitchen losartan (COZAAR) 25 MG tablet Take 1 tablet (25 mg total) by mouth daily. 30 tablet 0  . potassium chloride SA (K-DUR,KLOR-CON) 10 MEQ tablet Take 1 tablet (10 mEq total) by mouth daily. 30 tablet 0  .  carvedilol (COREG) 12.5 MG tablet Take 1.5 tablets (18.75 mg total) by mouth 2 (two) times daily. 270 tablet 3  . furosemide (LASIX) 40 MG tablet Take 1.5 tablets (60 mg total) by mouth daily.    Marland Kitchen warfarin (COUMADIN) 7.5 MG tablet Take 1 tablet (7.5 mg total) by mouth one time only at 6 PM. 30 tablet 0   No facility-administered medications prior to visit.      ROS Review of Systems  Constitutional: Negative for chills, fever and malaise/fatigue.  Respiratory: Positive for shortness of breath. Negative for cough.   Cardiovascular: Positive for leg swelling (not currently). Negative for chest pain, palpitations and orthopnea.  Gastrointestinal: Negative for abdominal pain, nausea and vomiting.  Genitourinary:       Negative for anuria/oliguria  Musculoskeletal: Negative for back pain.  Skin: Negative for rash.  Neurological: Positive for headaches. Negative for dizziness and tingling.    Objective:  BP 125/87   Pulse (!) 120   Temp 99.3 F (37.4 C) (Oral)   Resp (!) 24   Wt 212 lb 3.2 oz (96.3 kg)   SpO2 95% Comment: 2L O2  BMI 26.52 kg/m   BP/Weight 07/19/2016 07/13/2016 07/13/2016  Systolic BP 125 78 124  Diastolic BP 87 47 70  Wt. (Lbs) 212.2 202.8 201  BMI 26.52 25.35 25.12      Physical Exam  Constitutional: He is oriented to person, place, and time.  Breathing portable oxygen, NAD, polite  HENT:  Head: Normocephalic and atraumatic.  Eyes: No scleral icterus.  Cardiovascular:  Tachycardic, no murmur, no gallops, no rubs.  Pulmonary/Chest: No respiratory distress. He has no wheezes. He has no rales.  Abdominal: Soft. Bowel sounds are normal.  Musculoskeletal:  No LE edema bilaterally  Lymphadenopathy:    He has no cervical adenopathy.  Neurological: He is alert and oriented to person, place, and time. No cranial nerve deficit. He exhibits normal muscle tone. Coordination normal.  Skin: Skin is warm and dry. No rash noted. He is not diaphoretic. No erythema.    Psychiatric: He has a normal mood and affect. His behavior is normal. Thought content normal.     Assessment & Plan:   1. Chronic systolic CHF (congestive heart failure) (HCC) Pt has gained 10 lbs over 6 days. Fluid retention not physically apparent. He has been taking 40mg  of Lasix BID. However, patient has been having episodes of hypotension. Adjustment to Lasix to 60mg  per day and reduction of Losartan to half tab per day will help avoid another episode of drastic hypotension. We will work on his persistent tachycardia by increasing Coreg to 25mg . We aim to reduce risk of hypotension while also reducing pt's tachycardia.  At follow up, we can consider addition of Non-DHP CCB if appropriate and/or the elimination of Losartan.  2. Acute intractable headache, unspecified headache type - no acute neurological findings on exam. Vitals currently stable. EKG is at baseline. - acetaminophen (TYLENOL) 500 MG tablet; Take 1 tablet (500 mg total) by mouth every 6 (six) hours as needed.  Dispense: 30 tablet; Refill: 0  3. COPD exacerbation [facility]) - May be the cause of his SOB, advised to use Albuterol as directed. -Will consider Rx of Breo Ellipta on follow up if patient does not find relief with albuterol. Will also consider SOB 2/2 CHF.   *case discussed briefly with Dr. Hyman Hopes. He advised that patient's Losartan be reduced and potentially eliminated. I called patient and advised him to take half tablet of Losartan 25mg  once per day. Patient understood and thanked me for the call.  Meds ordered this encounter  Medications  . DISCONTD: furosemide (LASIX) 40 MG tablet    Sig: Take 1 tablet (40 mg total) by mouth 2 (two) times daily.    Dispense:  60 tablet    Refill:  2    Order Specific Question:   Supervising Provider    Answer:   Quentin Angst [id number]  . carvedilol (COREG) 25 MG tablet    Sig: Take 1 tablet (25 mg total) by mouth 2 (two) times daily.    Dispense:  60 tablet     Refill:  2    DOSE INCREASE    Order Specific Question:   Supervising Provider    Answer:   Quentin Angst [id number]  . acetaminophen (TYLENOL) 500 MG tablet    Sig: Take 1 tablet (500 mg total) by mouth every 6 (six) hours as needed.    Dispense:  30 tablet    Refill:  0    Order Specific Question:   Supervising Provider    Answer:   Quentin Angst [id number]  . furosemide (LASIX) 40 MG tablet    Sig: Take 0.5 tablets (20 mg total) by mouth 2 (two) times daily. Two tabs in the a.m. Then one tab in the p.m.    Dispense:  60 tablet  Refill:  2    Order Specific Question:   Supervising Provider    Answer:   Quentin Angst [[id number]]    Follow-up: Return in about 5 days (around 07/24/2016).   Loletta Specter PA

## 2016-07-20 ENCOUNTER — Telehealth: Payer: Self-pay

## 2016-07-20 NOTE — Telephone Encounter (Signed)
This Case Manager placed call to patient to check status. Patient indicated he was doing okay and did not have any specific health concerns. Patient saw Sindy Messing, PA yesterday and this Case Manager reviewed medication changes made at appointment. Reminded patient that he is to take half a tablet (12.5 mg) of Losartan daily. Patient is to take 2 (20 mg) tablets of furosemide in the morning and 1 (20 mg) tablet in the evening-total of 60 mg daily. Reminded patient that carvedilol increased, and he should take 25 mg twice daily. Patient verbalized understanding and denied any questions about his medications. Informed patient that he should weigh himself daily and keep record of the daily weights. Reminded patient to contact clinic if his weight increases 3 pounds or more in a day or 5 pounds or more in a week. Patient verbalized understanding. Discussed the need for scheduling a follow-up appointment with Dr. Venetia Night. Patient agreeable so appointment scheduled for 07/26/16 at 0945. No additional needs identified.

## 2016-07-24 ENCOUNTER — Encounter: Payer: Self-pay | Admitting: Internal Medicine

## 2016-07-24 ENCOUNTER — Telehealth: Payer: Self-pay

## 2016-07-24 ENCOUNTER — Ambulatory Visit (INDEPENDENT_AMBULATORY_CARE_PROVIDER_SITE_OTHER): Payer: Self-pay | Admitting: Internal Medicine

## 2016-07-24 VITALS — BP 128/74 | HR 122 | Ht 75.0 in | Wt 209.0 lb

## 2016-07-24 DIAGNOSIS — F17210 Nicotine dependence, cigarettes, uncomplicated: Secondary | ICD-10-CM

## 2016-07-24 DIAGNOSIS — J449 Chronic obstructive pulmonary disease, unspecified: Secondary | ICD-10-CM

## 2016-07-24 DIAGNOSIS — J9611 Chronic respiratory failure with hypoxia: Secondary | ICD-10-CM | POA: Insufficient documentation

## 2016-07-24 DIAGNOSIS — R918 Other nonspecific abnormal finding of lung field: Secondary | ICD-10-CM

## 2016-07-24 NOTE — Assessment & Plan Note (Signed)
02/29/2016  After extensive coaching HFA effectiveness =    75% > continue symbicort  - Spirometry 05/28/2016  FEV1 1.48 (40%)  Ratio 46  p am symbicort 160 2bid  - 06/26/2016  After extensive coaching HFA effectiveness =    90% > bevespi 2 bid and change coreg to bisoprolol 5 mg daily > not done as of 07/24/2016   Pt is Group B in terms of symptom/risk and laba/lama therefore appropriate rx at this point.   Nothing else to offer while on coreg and actively smoking/ advised   Each maintenance medication was reviewed in detail including most importantly the difference between maintenance and as needed and under what circumstances the prns are to be used.  Please see AVS for specific  Instructions which are unique to this visit and I personally typed out  which were reviewed in detail in writing with the patient and a copy provided.

## 2016-07-24 NOTE — Patient Instructions (Addendum)
Keep appt with your heart doctors and let them know I recommended a  substitute for corevidol due to your breathing problems  The key is to stop smoking completely before smoking completely stops you!   Only use your albuterol as a rescue medication to be used if you can't catch your breath by resting or doing a relaxed purse lip breathing pattern.  - The less you use it, the better it will work when you need it. - Ok to use up to 2 puffs  every 4 hours if you must but call for immediate appointment if use goes up over your usual need - Don't leave home without it !!  (think of it like the spare tire for your car)   Please schedule a follow up visit in 3 months but call sooner if needed

## 2016-07-24 NOTE — Assessment & Plan Note (Signed)
Adequate control on present rx, reviewed in detail with pt > no change in rx needed  = 2lpm 24/7  Cautioned re 02 use and cig smoking

## 2016-07-24 NOTE — Progress Notes (Signed)
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 He also reports dyspnea on exertion. In addition, he reports epigastric abdominal pain, which may radiate to the lower portion of his sternum versus chest pain . He denies any lower extremity edema. Admits to salt rich food ingestion .The symptoms are similar to those of one week ago, at which time, workup was suspicious for acute exacerbation of systolic heart failure. She was started on Cozaar, metoprolol and Lasix 20 mg daily at the time, and was recommended to see cardiology, which he failed to do so. His cardiologist is Dr. Dietrich Pates, last seen in December 2016.the patient continues to smoke 1 pack a day of cigarettes  Discharge Diagnoses:  Acute respiratory failure secondary to CHF exacerbation -O2 sats upon admission 89% -CXR unremarkable for infection -Responded well to IVlasix-->po lasix -Weaned off of O2-SpO2 95% on room air  Acute combined systolic/diastolic heart failure -BNP 2056.8 -Echocardiogram 09/20/2014 showed EF 40-45% -Transition to by mouth Lasix 20 mg daily -Echocardiogram 01/10/2016: EF 35-40%, diastolic dysfunction --AST 147, ALT 110 upon admission -Currently trending downward -hepatitis panel unremarkable -Patient denies drug/alcohol use -switch metoprolol-->coreg -continue  losartan  Essential Hypertension -Has not been taking home medications for over a year due to lack of insurance and monetary funds -Restarted on losartan, Lasix --switch metoprolol-->coreg 6.25 mg bid  CAD -Currently no chest pain -Continue coreg, losartan, ASA 81 mg daily -LDL 80  Tobacco Abuse -Smoking cessation discussed -Continue nicotine patch  Moderate malnutrition -Nutrition consult   History of Present Illness  02/29/2016 1st [facility] office visit/ Jared [name]  symb 160 2bid/ bid saba (not prn)  Chief Complaint  Patient presents with  . Pulmonary Consult    Pt referred by Tereso Newcomer, PA, for cough, hemoptysis, and emphysema. Pt c/o hemoptysis x 3 weeks, pt states this occurs daily but has been improving. Pt c/o increase in SOB with activity and rest.   acute  onset epistaxis/ hemoptyis end of Auguest > ER eval 02/20/16 dx RLL pna by CTa chest > rx levaquin/ prednisone and minimal hemoptysis now  Doe = MMRC2 = can't walk a nl pace on a flat grade s sob but does fine slow and flat eg [employer] shopping rec Plan A = Automatic =  Symbicort 160 Take 2 puffs first thing in am and then another 2 puffs about 12 hours later.  Work on inhaler technique: Plan B = Backup Only use your albuterol as a rescue medication\ Plan C = Crisis - only use your albuterol nebulizer if you first try Plan B and it fails to help > ok to use the nebulizer up to every 4 hours but if start needing it regularly call for immediate appointment  The key is to stop smoking completely before smoking completely stops you!  Please schedule a follow up office visit in 2 weeks, sooner if needed  with cxr on return > did not return   Admit date: 05/04/2016 Discharge date: 05/09/2016    DISCHARGE DIAGNOSES:  Principal Problem:   Hemoptysis Active Problems:   Community acquired pneumonia of right lower lobe of lung (HCC) - - FOB RLL tbbx ATYPICAL PNEUMOCYTES PROLIFERATION/ LLL BAL 05/08/16     Hypertension   COPD with acute exacerbation (HCC)   Nonischemic dilated cardiomyopathy (HCC)   Cigarette smoker   Chronic systolic CHF (congestive heart failure) (HCC)     05/28/2016  f/u ov/Jared [name] re: post hosp for ? pna / GOLD III copd / still smoking / maint rx symb 160 2bid /saba bid Chief Complaint  Patient presents with  . Hospitalization Follow-up    His breathing has improved. He still has cough and mucus is now clear. He is using rescue inhaler 2 x daily on average.    back to baseline doe = MMRC2 = can't walk a nl pace on a flat grade s sob but does fine slow and flat eg shopping is ok rec  Please schedule a follow up visit in 3 months but call sooner if needed with cxr on return  Late add :  Move up f/u to 4 weeks with ct chest first   06/03/16 to ER> only better while on neb    06/26/2016  f/u ov/Jared [name] re:  GOLD III copd / maint symb 160 / saba  Chief Complaint  Patient presents with  . Follow-up    pt had CT today. pt states breathing has worsen since last OV. pt c/o increased sob with exertion, prod cough with white mucus, wheezing & chest tightness,  breathing was worse w/in 2 days of last ov / then ran out of lasix one week ago and swelling started to get worse Also worse orthopnea ? Related to leg swelling in terms of timing but pt not sure  Doe now = MMRC3 = can't walk 100 yards even at a slow pace at a flat grade s stopping due to sob  rec Bisoprolol 5 mg one daily in place of the twice daily corevidol and resume your lasix and potassium  Plan A = Automatic = stop symbicort and start bevespi Take 2 puffs first thing in am and then another 2 puffs about 12 hours later.  Plan B = Backup Only use your albuterol as a rescue medication The key is to stop smoking completely before smoking completely stops you!  Please schedule a follow up office visit in 4 weeks, sooner if needed bring all meds     07/24/2016  f/u ov/Jared [name] re:  Copd/ chf on bevespi 2 bid and prn saba  but overusing at baseline and still smoking  Chief Complaint  Patient presents with  . Follow-up    Pt states breathing continues to worsen. He is now using o2 24/7. He c/o swelling in his legs and abdomen. He also c/o nose bleeds recently. He is not coughing much. He is using ventolin HFA 4 x daily on average.    No obvious patterns in  day to day or daytime variability or assoc   purulent sputum or mucus plugs    cp or chest tightness, subjective wheeze or overt sinus or hb symptoms. No unusual exp hx or h/o childhood pna/ asthma or knowledge of premature birth.  Sleeping ok without nocturnal  or early am exacerbation  of respiratory  c/o's or need for noct saba. Also denies any obvious fluctuation of symptoms with weather or environmental  changes or other aggravating or alleviating factors except as outlined above   Current Medications, Allergies, Complete Past Medical History, Past Surgical History, Family History, and Social History were reviewed in [employer] record.  ROS  The following are not active complaints unless bolded sore throat, dysphagia, dental problems, itching, sneezing,  nasal congestion or excess/ purulent secretions, ear ache,   fever, chills, sweats, unintended wt loss, classically pleuritic or exertional cp,  orthopnea pnd and  leg swelling correlate with worse sob daytime, presyncope, palpitations, abdominal pain, anorexia, nausea, vomiting, diarrhea  or change in bowel or bladder habits, change in stools or urine, dysuria,hematuria,  rash, arthralgias, visual complaints, headache, numbness, weakness or ataxia or problems with walking or coordination,  change in mood/affect or memory.                 Objective:   Physical Exam  amb bm nad > stated age  34/13/2018        209  06/26/2016        213  05/28/2016      194   02/29/16 177 lb 12.8 oz (80.6 kg)  02/24/16 182 lb 12.8 oz (82.9 kg)  02/20/16 175 lb (79.4 kg)    Vital signs reviewed  - sats 96% on 2lpm  on arrival    HEENT: nl   oropharynx. Nl external ear canals without cough reflex -  Poor dentition/ moderate bilateral non-specific turbinate edema     NECK :  without JVD/Nodes/TM/ nl carotid upstrokes bilaterally   LUNGS: no acc muscle use,  slt barrel  contour chest  With distant bs/ min bilateral exp rhonchi better with plm   CV:  RRR  no s3 or murmur or increase in P2, 1+ sym bilateral lower ext edema   ABD:  soft and nontender with end inspiratory hoover's in the supine position. No bruits or organomegaly, bowel sounds nl  MS:  Nl gait/ ext warm without deformities, calf tenderness, cyanosis or clubbing No obvious joint restrictions   SKIN: warm and dry without lesions    NEURO:  alert, approp, nl sensorium with  no motor deficits         I personally reviewed images and agree with radiology impression as follows:  CXR:   07/01/16 Emphysema.  No focal consolidation or pneumothorax. Stable mild elevation of the right hemidiaphragm. Stable cardiomegaly.  No congestive changes or interstitial edema.          Assessment & Plan:

## 2016-07-24 NOTE — Assessment & Plan Note (Signed)
New on 01/02/16 vs 08/18/15  Dx 02/20/16 ER ? Pna  > Levaquin > repeat cxr due 1st week in Oct 2017  - FOB RLL tbbx 05/08/16  ATYPICAL PNEUMOCYTES PROLIFERATION     - cxr 05/28/2016 95% clear  - CT chest 06/26/2016 1. Diffuse bronchial wall thickening with emphysema, as above; imaging findings suggestive of underlying COPD. 2. Aortic atherosclerosis and 3 vessel coronary artery calcification 3. Small right pleural effusion 4. 7 mm subpleural nodule is stable from 02/20/2016. Future CT at 18-24 months (from 02/20/2016) is considered optional for low-risk patients, but is recommended for high-risk patients > placed in tickle for 18 m

## 2016-07-24 NOTE — Telephone Encounter (Signed)
Attempted to contact the patient to check on his status and to remind him of his [facility] appointment on 07/26/16 @ 0945. Call placed to # [phone] (M) x 2 and each time the call was dropped after 8 rings. Unable to leave a message.

## 2016-07-25 ENCOUNTER — Emergency Department (HOSPITAL_COMMUNITY)
Admission: EM | Admit: 2016-07-25 | Discharge: 2016-07-25 | Disposition: A | Discharge status: Home / Self Care | Payer: Self-pay | Attending: Emergency Medicine | Admitting: Emergency Medicine

## 2016-07-25 ENCOUNTER — Ambulatory Visit (INDEPENDENT_AMBULATORY_CARE_PROVIDER_SITE_OTHER): Payer: Self-pay | Admitting: *Deleted

## 2016-07-25 ENCOUNTER — Emergency Department (HOSPITAL_COMMUNITY): Payer: Self-pay

## 2016-07-25 ENCOUNTER — Encounter (HOSPITAL_COMMUNITY): Payer: Self-pay | Admitting: Emergency Medicine

## 2016-07-25 DIAGNOSIS — I2510 Atherosclerotic heart disease of native coronary artery without angina pectoris: Secondary | ICD-10-CM | POA: Insufficient documentation

## 2016-07-25 DIAGNOSIS — F10920 Alcohol use, unspecified with intoxication, uncomplicated: Secondary | ICD-10-CM

## 2016-07-25 DIAGNOSIS — Z79899 Other long term (current) drug therapy: Secondary | ICD-10-CM | POA: Insufficient documentation

## 2016-07-25 DIAGNOSIS — W19XXXA Unspecified fall, initial encounter: Secondary | ICD-10-CM

## 2016-07-25 DIAGNOSIS — J449 Chronic obstructive pulmonary disease, unspecified: Secondary | ICD-10-CM | POA: Insufficient documentation

## 2016-07-25 DIAGNOSIS — Z7901 Long term (current) use of anticoagulants: Secondary | ICD-10-CM | POA: Insufficient documentation

## 2016-07-25 DIAGNOSIS — R55 Syncope and collapse: Secondary | ICD-10-CM | POA: Insufficient documentation

## 2016-07-25 DIAGNOSIS — I5042 Chronic combined systolic (congestive) and diastolic (congestive) heart failure: Secondary | ICD-10-CM | POA: Insufficient documentation

## 2016-07-25 DIAGNOSIS — R7989 Other specified abnormal findings of blood chemistry: Secondary | ICD-10-CM | POA: Insufficient documentation

## 2016-07-25 DIAGNOSIS — R945 Abnormal results of liver function studies: Secondary | ICD-10-CM

## 2016-07-25 DIAGNOSIS — F17210 Nicotine dependence, cigarettes, uncomplicated: Secondary | ICD-10-CM | POA: Insufficient documentation

## 2016-07-25 DIAGNOSIS — Z5181 Encounter for therapeutic drug level monitoring: Secondary | ICD-10-CM

## 2016-07-25 DIAGNOSIS — I483 Typical atrial flutter: Secondary | ICD-10-CM

## 2016-07-25 DIAGNOSIS — I110 Hypertensive heart disease with heart failure: Secondary | ICD-10-CM | POA: Insufficient documentation

## 2016-07-25 DIAGNOSIS — I509 Heart failure, unspecified: Secondary | ICD-10-CM

## 2016-07-25 DIAGNOSIS — F10129 Alcohol abuse with intoxication, unspecified: Secondary | ICD-10-CM | POA: Insufficient documentation

## 2016-07-25 LAB — CBC WITH DIFFERENTIAL/PLATELET
Basophils Absolute: 0 10*3/uL (ref 0.0–0.1)
Basophils Relative: 0 %
Eosinophils Absolute: 0.1 10*3/uL (ref 0.0–0.7)
Eosinophils Relative: 1 %
HCT: 45.1 % (ref 39.0–52.0)
Hemoglobin: 13.7 g/dL (ref 13.0–17.0)
Lymphocytes Relative: 34 %
Lymphs Abs: 2.1 10*3/uL (ref 0.7–4.0)
MCH: 30.4 pg (ref 26.0–34.0)
MCHC: 30.4 g/dL (ref 30.0–36.0)
MCV: 100.2 fL — ABNORMAL HIGH (ref 78.0–100.0)
Monocytes Absolute: 0.4 10*3/uL (ref 0.1–1.0)
Monocytes Relative: 6 %
Neutro Abs: 3.6 10*3/uL (ref 1.7–7.7)
Neutrophils Relative %: 59 %
Platelets: 144 10*3/uL — ABNORMAL LOW (ref 150–400)
RBC: 4.5 MIL/uL (ref 4.22–5.81)
RDW: 14 % (ref 11.5–15.5)
WBC: 6.1 10*3/uL (ref 4.0–10.5)

## 2016-07-25 LAB — COMPREHENSIVE METABOLIC PANEL
ALT: 100 U/L — ABNORMAL HIGH (ref 17–63)
AST: 92 U/L — ABNORMAL HIGH (ref 15–41)
Albumin: 3.6 g/dL (ref 3.5–5.0)
Alkaline Phosphatase: 137 U/L — ABNORMAL HIGH (ref 38–126)
Anion gap: 8 (ref 5–15)
BUN: 19 mg/dL (ref 6–20)
CO2: 33 mmol/L — ABNORMAL HIGH (ref 22–32)
Calcium: 8.8 mg/dL — ABNORMAL LOW (ref 8.9–10.3)
Chloride: 103 mmol/L (ref 101–111)
Creatinine, Ser: 1.05 mg/dL (ref 0.61–1.24)
GFR calc Af Amer: >60 mL/min (ref >60)
GFR calc non Af Amer: >60 mL/min (ref >60)
Glucose, Bld: 174 mg/dL — ABNORMAL HIGH (ref 65–99)
Potassium: 4.1 mmol/L (ref 3.5–5.1)
Sodium: 144 mmol/L (ref 135–145)
Total Bilirubin: 1.1 mg/dL (ref 0.3–1.2)
Total Protein: 6 g/dL — ABNORMAL LOW (ref 6.5–8.1)

## 2016-07-25 LAB — URINALYSIS, ROUTINE W REFLEX MICROSCOPIC
Bilirubin Urine: NEGATIVE
Glucose, UA: 50 mg/dL — AB
Ketones, ur: NEGATIVE mg/dL
Leukocytes, UA: NEGATIVE
Nitrite: NEGATIVE
Protein, ur: 100 mg/dL — AB
Specific Gravity, Urine: 1.017 (ref 1.005–1.030)
Squamous Epithelial / LPF: NONE SEEN
pH: 5 (ref 5.0–8.0)

## 2016-07-25 LAB — BRAIN NATRIURETIC PEPTIDE: B Natriuretic Peptide: 549.3 pg/mL — ABNORMAL HIGH (ref 0.0–100.0)

## 2016-07-25 LAB — POCT INR: INR: 3

## 2016-07-25 LAB — PROTIME-INR
INR: 2.38
Prothrombin Time: 26.4 seconds — ABNORMAL HIGH (ref 11.4–15.2)

## 2016-07-25 LAB — I-STAT TROPONIN, ED: Troponin i, poc: 0 ng/mL (ref 0.00–0.08)

## 2016-07-25 LAB — ETHANOL: Alcohol, Ethyl (B): 70 mg/dL — ABNORMAL HIGH (ref <5)

## 2016-07-25 MED ORDER — WARFARIN SODIUM 7.5 MG PO TABS: ORAL_TABLET | ORAL | 2 refills | Status: DC

## 2016-07-25 MED ORDER — FUROSEMIDE 10 MG/ML IJ SOLN
20.0000 mg | Freq: Once | INTRAMUSCULAR | Status: AC
  Administered 2016-07-25: 20 mg via INTRAVENOUS
  Filled 2016-07-25: qty 2

## 2016-07-25 MED FILL — WARFARIN NA 7.5 MG TAB: 7.5 | 30 days supply | Qty: 30 | Fill #0

## 2016-07-25 NOTE — ED Triage Notes (Signed)
PT brought in by EMS for suspected syncopal. PT had one glass of wine and was to the bathroom when he fell/had a syncopal episode. PT reports he remembers falling and remembers the entire event. However, he believes he passed out. Event was not witnessed. PT reports he does remember the entire fall, but cannot provide any information on how he fell. PT is alert and oriented x4. PT reports no pain.

## 2016-07-25 NOTE — ED Notes (Signed)
PT able to ambulate down hall without assistance.

## 2016-07-25 NOTE — Discharge Instructions (Signed)
Stop drinking alcohol. Take all of your usual home medications as directed by your doctors. Follow up with your primary care doctor and your cardiologist in the next 3-5 days for recheck of symptoms. Return to the ER for emergent changes or worsening symptoms.

## 2016-07-25 NOTE — ED Notes (Signed)
Patient transported to CT 

## 2016-07-25 NOTE — ED Provider Notes (Signed)
[facility] DEPT Provider Note   CSN: [id number] Arrival date & time: 07/25/16  1524     History   Chief Complaint Chief Complaint  Patient presents with  . Near Syncope    HPI Jared [name] is a 61 y.o. male with a PMHx of CAD, CHF, headaches, COPD on home oxygen, CVA, HTN, tobacco use, NICM, and aflutter on coumadin, who presents to the ED with complaints of ?syncopal episode prior to arrival. Patient is a poor historian and very grumpy during history and not wanting to answer very many questions, which limits evaluation slightly. He reports that he was walking to his bathroom just prior to arrival and he "fell out" for about 30 seconds and then "woke up again". Denies postictal confusion or state, denies feeling lightheaded or any presyncopal symptoms prior to his "syncopal event". He admits that he drank 1 glass of wine earlier today but denies any illicit drug use. Of note, triage notes report that this was an event that occurred last night; however pt is adamant that it occurred just PTA. No one was with him during the event. No known aggravating or alleviating factors. He denies any headache, vision changes, neck or back pain, lightheadedness, vertiginous symptoms, incontinence of urine/stool, tongue biting, fevers, chills, CP, SOB, cough, abd pain, N/V/D/C, hematuria, dysuria, myalgias, arthralgias, numbness, tingling, focal weakness, bruises, abrasions, or any other complaints at this time. States he's on blood thinners but can't remember what they are. Endorses compliance with his lasix, but can't recall the doses. States he took it at around 7am today.    The history is provided by the patient and medical records. No language interpreter was used.  Near Syncope  This is a new problem. The current episode started 1 to 2 hours ago. The problem occurs rarely. The problem has been resolved. Pertinent negatives include no chest pain, no abdominal pain, no headaches and no shortness of  breath. Nothing aggravates the symptoms. Nothing relieves the symptoms. He has tried nothing for the symptoms. The treatment provided no relief.    Past Medical History:  Diagnosis Date  . CAD (coronary artery disease)    a. LHC 5/12:  LAD 20, pLCx 20, pRCA 40, dRCA 40, EF 35%, diff HK  //  b. Myoview 4/16: Overall Impression: High risk stress nuclear study There is no evidence of ischemia. There is severe LV dysfunction. LV Ejection Fraction: 30%. LV Wall Motion: There is global LV hypokinesis.   Marland Kitchen CAP (community acquired pneumonia) 09/2013  . Chronic combined systolic and diastolic CHF (congestive heart failure) (HCC)    a. Echo 4/16:Mild LVH, EF 40-45%, diffuse HK //  b. Echo 8/17: EF 35-40%, diffuse HK, diastolic dysfunction, aortic sclerosis, trivial MR, moderate LAE, normal RVSF, moderate RAE, mild TR, PASP 42 mmHg  . Cluster headache    "hx; haven't had one in awhile" (01/09/2016)  . COPD (chronic obstructive pulmonary disease) ([place])    [name] 01/09/2016  . History of CVA (cerebrovascular accident)   . Hypertension   . NICM (nonischemic cardiomyopathy) ([place])   . Tobacco abuse     Patient Active Problem List   Diagnosis Date Noted  . Chronic respiratory failure with hypoxia ([place]) 07/24/2016  . Encounter for therapeutic drug monitoring 07/11/2016  . Atrial flutter ([place])   . Hepatic congestion 07/02/2016  . Chronic combined systolic and diastolic CHF (congestive heart failure) ([place]) 07/01/2016  . COPD exacerbation ([place]) 06/03/2016  . Prediabetes 05/14/2016  . Hemoptysis 05/06/2016  . COPD GOLD  III/still smoking  02/29/2016  . Chronic systolic CHF (congestive heart failure) ([place])   . Cigarette smoker 01/09/2016  . CAD (coronary artery disease) 10/07/2013  . Nonischemic dilated cardiomyopathy ([place]) 10/07/2013  . Centrilobular emphysema ([place]) 10/04/2013  . Pulmonary infiltrate in right lung on CXR 10/03/2013  . Essential hypertension     Past Surgical History:  Procedure  Laterality Date  . CARDIAC CATHETERIZATION  10/2010   LM normal, LAD with 20% irregularities, LCX with 20%, RCA with 40% prox and 40% distal - EF of 35%  . COLONOSCOPY W/ BIOPSIES AND POLYPECTOMY    . VIDEO BRONCHOSCOPY Bilateral 05/08/2016   Procedure: VIDEO BRONCHOSCOPY WITH FLUORO;  Surgeon: Rigoberto Noel, MD;  Location: [facility];  Service: Cardiopulmonary;  Laterality: Bilateral;       Home Medications    Prior to Admission medications   Medication Sig Start Date End Date Taking? Authorizing Provider  albuterol (PROVENTIL HFA;VENTOLIN HFA) 108 (90 Base) MCG/ACT inhaler Inhale 2 puffs into the lungs every 6 (six) hours as needed for wheezing or shortness of breath. 06/26/16  Yes Tanda Rockers, MD  carvedilol (COREG) 25 MG tablet Take 1 tablet (25 mg total) by mouth 2 (two) times daily. 07/19/16 10/17/16 Yes Roger Roderic Ovens, PA  furosemide (LASIX) 40 MG tablet Take 40 mg by mouth 2 (two) times daily.   Yes Historical Provider, MD  Glycopyrrolate-Formoterol (BEVESPI AEROSPHERE) 9-4.8 MCG/ACT AERO Inhale 2 puffs into the lungs 2 (two) times daily.   Yes Historical Provider, MD  ibuprofen (ADVIL,MOTRIN) 200 MG tablet Take 200 mg by mouth every 6 (six) hours as needed.   Yes Historical Provider, MD  losartan (COZAAR) 25 MG tablet Take half tablet by mouth daily. 07/19/16  Yes Roger Roderic Ovens, PA  OXYGEN 2lpm 24/7   Yes Historical Provider, MD  potassium chloride SA (K-DUR,KLOR-CON) 10 MEQ tablet Take 1 tablet (10 mEq total) by mouth daily. 07/06/16  Yes Maryann Mikhail, DO  warfarin (COUMADIN) 7.5 MG tablet Take as directed by coumadin clinic 07/25/16  Yes Fay Records, MD    Family History Family History  Problem Relation Age of Onset  . Cancer Mother   . Heart disease Mother   . Diabetes Mother   . Cancer Father   . Diabetes Father   . Diabetes Sister   . Diabetes Brother     Social History Social History  Substance Use Topics  . Smoking status: Current Every Day Smoker     Packs/day: 0.25    Years: 42.00    Types: Cigarettes  . Smokeless tobacco: Never Used     Comment: 5 cigs daily  . Alcohol use 0.0 oz/week     Comment: drinks 1x per month on pay day     Allergies   Lisinopril   Review of Systems Review of Systems  Constitutional: Negative for chills and fever.  HENT:       No tongue biting  Eyes: Negative for visual disturbance.  Respiratory: Negative for cough and shortness of breath.   Cardiovascular: Positive for near-syncope. Negative for chest pain.  Gastrointestinal: Negative for abdominal pain, constipation, diarrhea, nausea and vomiting.  Genitourinary: Negative for difficulty urinating (no incontinence), dysuria and hematuria.  Musculoskeletal: Negative for arthralgias, back pain, myalgias and neck pain.  Skin: Negative for color change and wound.  Allergic/Immunologic: Negative for immunocompromised state.  Neurological: Positive for syncope. Negative for dizziness, weakness, light-headedness, numbness and headaches.  Hematological: Bruises/bleeds easily (on coumadin).  Psychiatric/Behavioral: Negative for  confusion.   10 Systems reviewed and are negative for acute change except as noted in the HPI.   Physical Exam Updated Vital Signs BP 106/73   Pulse 91   Temp 98.2 F (36.8 C) (Oral)   Resp 16   Ht 6' 3"  (1.905 m)   Wt 97.1 kg   SpO2 98%   BMI 26.75 kg/m   Physical Exam  Constitutional: He is oriented to person, place, and time. Vital signs are normal. He appears well-developed and well-nourished.  Non-toxic appearance. No distress. Cervical collar in place.  Afebrile, nontoxic, NAD, in c-collar, somewhat grumpy, smells of alcohol  HENT:  Head: Normocephalic and atraumatic. Head is without raccoon's eyes, without Battle's sign, without abrasion and without contusion.  Right Ear: Hearing, tympanic membrane, external ear and ear canal normal.  Left Ear: Hearing, tympanic membrane, external ear and ear canal normal.    Mouth/Throat: Oropharynx is clear and moist and mucous membranes are normal.  [place]/AT, no abrasions or contusions, no scalp crepitus or deformity, no bony stepoffs, no hemotympanum, no s/sx of basilar skull fx, no racoon eyes or battle's sign  Eyes: Conjunctivae and EOM are normal. Pupils are equal, round, and reactive to light. Right eye exhibits no discharge. Left eye exhibits no discharge.  PERRL, EOMI, no nystagmus, no visual field deficits   Neck: Neck supple. No spinous process tenderness and no muscular tenderness present.  In C collar therefore ROM not assessed; no spinous process TTP, no bony stepoffs or deformities, no paraspinous muscle TTP or muscle spasms. No bruising or swelling.   Cardiovascular: Normal rate, regular rhythm, normal heart sounds and intact distal pulses.  Exam reveals no gallop and no friction rub.   No murmur heard. Pulmonary/Chest: Accessory muscle usage present. No respiratory distress. He has no decreased breath sounds. He has wheezes. He has no rhonchi. He has rales.  Some accessory muscle usage and belly breathing, no respiratory distress but appears to be mildly short of breath, bibasilar rales and faint scattered wheezing throughout all lung fields, no rhonchi, no hypoxia, speaking in somewhat fragmented sentences which could be due to pt not wanting to talk or cooperate fully with exam, SpO2 98% on 2L via [place]  Abdominal: Soft. Normal appearance and bowel sounds are normal. He exhibits no distension. There is no tenderness. There is no rigidity, no rebound, no guarding, no CVA tenderness, no tenderness at McBurney's point and negative Murphy's sign.  Musculoskeletal: Normal range of motion.  MAE x4 Strength and sensation grossly intact in all extremities Distal pulses intact Gait not assessed 1+ b/l pedal edema, neg homan's sign  Neurological: He is alert and oriented to person, place, and time. He has normal strength. No cranial nerve deficit or sensory  deficit. Coordination normal. GCS eye subscore is 4. GCS verbal subscore is 5. GCS motor subscore is 6.  CN 2-12 grossly intact A&O x4 GCS 15 Sensation and strength intact Coordination with finger-to-nose WNL Neg pronator drift  Pt grumpy and not wanting to cooperate with exam, although speech clear  Skin: Skin is warm, dry and intact. No abrasion, no bruising and no rash noted.  No abrasions or bruising noted to exposed surfaces  Psychiatric: His affect is labile.  grumpy  Nursing note and vitals reviewed.    ED Treatments / Results  Labs (all labs ordered are listed, but only abnormal results are displayed) Labs Reviewed  CBC WITH DIFFERENTIAL/PLATELET - Abnormal; Notable for the following:       Result  Value   MCV 100.2 (*)    Platelets 144 (*)    All other components within normal limits  COMPREHENSIVE METABOLIC PANEL - Abnormal; Notable for the following:    CO2 33 (*)    Glucose, Bld 174 (*)    Calcium 8.8 (*)    Total Protein 6.0 (*)    AST 92 (*)    ALT 100 (*)    Alkaline Phosphatase 137 (*)    All other components within normal limits  PROTIME-INR - Abnormal; Notable for the following:    Prothrombin Time 26.4 (*)    All other components within normal limits  URINALYSIS, ROUTINE W REFLEX MICROSCOPIC - Abnormal; Notable for the following:    APPearance HAZY (*)    Glucose, UA 50 (*)    Hgb urine dipstick SMALL (*)    Protein, ur 100 (*)    Bacteria, UA RARE (*)    All other components within normal limits  BRAIN NATRIURETIC PEPTIDE - Abnormal; Notable for the following:    B Natriuretic Peptide 549.3 (*)    All other components within normal limits  ETHANOL - Abnormal; Notable for the following:    Alcohol, Ethyl (B) 70 (*)    All other components within normal limits  I-STAT TROPOININ, ED    EKG  EKG Interpretation  Date/Time:  Wednesday July 25 2016 15:43:11 EST Ventricular Rate:  90 PR Interval:    QRS Duration: 93 QT Interval:  359 QTC  Calculation: 440 R Axis:   -79 Text Interpretation:  Sinus rhythm Ventricular premature complex Inferior infarct, old Since prior ECG, he is no longer having ectopic atrial tachycardia Confirmed by [facility] MD, ERIN ([id number]) on 07/25/2016 5:50:19 PM       Radiology Dg Chest 2 View  Result Date: 07/25/2016 CLINICAL DATA:  61 year old male with syncope and fall today. On blood thinners. Initial encounter. EXAM: CHEST  2 VIEW COMPARISON:  07/01/2016 and earlier. FINDINGS: Upright AP and lateral views of the chest. Moderate to severe cardiomegaly is stable. Larger lung volumes and improved bibasilar ventilation. Mild pulmonary vascular congestion without overt edema. No pneumothorax, pleural effusion or confluent pulmonary opacity. Visualized tracheal air column is within normal limits. Calcified aortic atherosclerosis. Osteopenia. Chronic left lateral rib fractures. Chronic left clavicle deformity. No acute osseous abnormality identified. Negative visible bowel gas pattern. IMPRESSION: No acute cardiopulmonary abnormality. No acute traumatic injury identified. Stable cardiomegaly.  Calcified aortic atherosclerosis. Electronically Signed   By: Genevie Ann M.D.   On: 07/25/2016 17:00   Ct Head Wo Contrast  Result Date: 07/25/2016 CLINICAL DATA:  Syncope, fall, on blood thinners, history coronary artery disease, hypertension, COPD, CHF, non ischemic cardiomyopathy, smoker EXAM: CT HEAD WITHOUT CONTRAST CT CERVICAL SPINE WITHOUT CONTRAST TECHNIQUE: Multidetector CT imaging of the head and cervical spine was performed following the standard protocol without intravenous contrast. Multiplanar CT image reconstructions of the cervical spine were also generated. Imaging of the cervical spine is severely limited by motion artifacts. COMPARISON:  None ; correlation cervical spine radiographs 12/12/2014 FINDINGS: CT HEAD FINDINGS Brain: Normal ventricular morphology. No midline shift or mass effect. Benign-appearing  symmetric basal ganglia calcifications. No intracranial hemorrhage, mass lesion or evidence acute infarction. No extra-axial fluid collections. Vascular: Atherosclerotic calcifications at the carotid siphons and RIGHT vertebral artery. Skull: Mild scattered motion artifacts. No definite focal calvarial abnormality seen. Sinuses/Orbits: Clear Other: N/A CT CERVICAL SPINE FINDINGS Severe limitations of exam secondary to motion artifacts particularly at C3-C6 and skullbase. Examination is nondiagnostic,  unable to exclude exclude cervical spine abnormalities in this setting. Alignment: Grossly normal Skull base and vertebrae: No definite focal abnormalities identified within limitations of motion artifacts Soft tissues and spinal canal: Prevertebral soft tissues grossly normal thickness Disc levels: Disc space narrowing with endplate spur formation particularly C5-T1 Upper chest: Probably emphysematous Other: N/A IMPRESSION: No acute intracranial abnormalities. Nondiagnostic CT imaging of the cervical spine secondary to patient motion as above ; consider repeating exam when patient is able to cooperate fully. Electronically Signed   By: Lavonia Dana M.D.   On: 07/25/2016 17:24   Ct Cervical Spine Wo Contrast  Result Date: 07/25/2016 CLINICAL DATA:  Syncope, fall, on blood thinners, history coronary artery disease, hypertension, COPD, CHF, non ischemic cardiomyopathy, smoker EXAM: CT HEAD WITHOUT CONTRAST CT CERVICAL SPINE WITHOUT CONTRAST TECHNIQUE: Multidetector CT imaging of the head and cervical spine was performed following the standard protocol without intravenous contrast. Multiplanar CT image reconstructions of the cervical spine were also generated. Imaging of the cervical spine is severely limited by motion artifacts. COMPARISON:  None ; correlation cervical spine radiographs 12/12/2014 FINDINGS: CT HEAD FINDINGS Brain: Normal ventricular morphology. No midline shift or mass effect. Benign-appearing  symmetric basal ganglia calcifications. No intracranial hemorrhage, mass lesion or evidence acute infarction. No extra-axial fluid collections. Vascular: Atherosclerotic calcifications at the carotid siphons and RIGHT vertebral artery. Skull: Mild scattered motion artifacts. No definite focal calvarial abnormality seen. Sinuses/Orbits: Clear Other: N/A CT CERVICAL SPINE FINDINGS Severe limitations of exam secondary to motion artifacts particularly at C3-C6 and skullbase. Examination is nondiagnostic, unable to exclude exclude cervical spine abnormalities in this setting. Alignment: Grossly normal Skull base and vertebrae: No definite focal abnormalities identified within limitations of motion artifacts Soft tissues and spinal canal: Prevertebral soft tissues grossly normal thickness Disc levels: Disc space narrowing with endplate spur formation particularly C5-T1 Upper chest: Probably emphysematous Other: N/A IMPRESSION: No acute intracranial abnormalities. Nondiagnostic CT imaging of the cervical spine secondary to patient motion as above ; consider repeating exam when patient is able to cooperate fully. Electronically Signed   By: Lavonia Dana M.D.   On: 07/25/2016 17:24    Procedures Procedures (including critical care time)  Medications Ordered in ED Medications  furosemide (LASIX) injection 20 mg (20 mg Intravenous Given 07/25/16 1731)     Initial Impression / Assessment and Plan / ED Course  I have reviewed the triage vital signs and the nursing notes.  Pertinent labs & imaging results that were available during my care of the patient were reviewed by me and considered in my medical decision making (see chart for details).     61 y.o. male here with ?syncopal event, pt poor historian and this was an unwitnessed event. Pt states it was just PTA, however nursing note reports it was last night. Pt A&O x4 but consistently states the event was just PTA, states he was going to the bathroom and "fell  out", denies feeling lightheaded prior to the event. States he was "out" for 30 seconds. No focal neuro deficits on exam however he does not cooperate fully with exam, and pt not ambulated during exam. Appears to be mildly short of breath, some accessory muscle use, although he denies any SOB/CP. 1+ bilateral pedal edema noted. Pt reports compliance on lasix, but can't tell me how much he takes. Recent admission for CHF. Bibasilar rales and faint expiratory wheezing on exam. No s/sx of basilar skull fx. Will obtain labs, EKG, CT head/neck, and CXR, and give lasix.  Will reassess shortly  6:57 PM Pt much more cooperative with exam on repeat evaluation, speech clear, denies any complaints aside from wanting to drink water and eat; appears less short of breath compared to before, and denies any symptoms at this time. Trop neg. EKG without acute ischemic findings. CBC w/diff with plt 144 but otherwise unremarkable. CMP with chronically elevated bicarb, AST/ALT elevated similar to prior, alk phos elevated similar to prior. INR 2.38 near his target range. EtOH level 70, likely contributing to his ?syncopal event. BNP 549.3 which is slightly better than his most recent admission values. CXR with stable cardiomegaly, mild pulm vascular congestion without overt edema; no acute CHF exacerbation. CT head/neck negative however pt moving so cervical spine eval nondiagnostic. Given lack of tenderness, will not repeat this exam today. Given that pt isn't complaining of any CP, SOB, or any other complaints, and he's neurologically stable, I feel we can likely discharge him home with instructions to stop drinking alcohol, take his regular medications as directed, and f/up with his PCP and cardiologist in 3-5 days for recheck. Discussed case with my attending Dr. Billy Fischer who agrees with plan. U/A not yet done, however he has provided a specimen so will have nursing staff collect this now and send for analysis. Will reassess  after U/A results  7:50 PM U/A unremarkable. Pt ambulatory without difficulty, appears comfortable and respiratory effort improved after lasix, no further complaints including during ambulation. Neurological status stable, and appears clinically sober. Will proceed with d/c instructions as previously outlined. F/up with PCP and cardiologist in 3-5 days for recheck. Strict return precautions advised. I explained the diagnosis and have given explicit precautions to return to the ER including for any other new or worsening symptoms. The patient understands and accepts the medical plan as it's been dictated and I have answered their questions. Discharge instructions concerning home care and prescriptions have been given. The patient is STABLE and is discharged to home in good condition.   Final Clinical Impressions(s) / ED Diagnoses   Final diagnoses:  Near syncope  Alcoholic intoxication without complication ([place])  Fall, initial encounter  Elevated LFTs  Congestive heart failure, unspecified congestive heart failure chronicity, unspecified congestive heart failure type [facility])    New Prescriptions New Prescriptions   No medications on file     [name], PA-C 07/25/16 [place], MD 07/30/16 [phone]

## 2016-07-26 ENCOUNTER — Inpatient Hospital Stay: Payer: Self-pay | Admitting: Family Medicine

## 2016-07-27 ENCOUNTER — Encounter: Payer: Self-pay | Admitting: Physician Assistant

## 2016-07-27 ENCOUNTER — Telehealth: Payer: Self-pay

## 2016-07-27 ENCOUNTER — Ambulatory Visit: Payer: Self-pay | Attending: Physician Assistant | Admitting: Physician Assistant

## 2016-07-27 VITALS — BP 124/82 | HR 85 | Temp 98.3°F | Resp 20 | Wt 220.0 lb

## 2016-07-27 DIAGNOSIS — Z79899 Other long term (current) drug therapy: Secondary | ICD-10-CM | POA: Insufficient documentation

## 2016-07-27 DIAGNOSIS — I509 Heart failure, unspecified: Secondary | ICD-10-CM | POA: Insufficient documentation

## 2016-07-27 DIAGNOSIS — Z7901 Long term (current) use of anticoagulants: Secondary | ICD-10-CM | POA: Insufficient documentation

## 2016-07-27 DIAGNOSIS — J449 Chronic obstructive pulmonary disease, unspecified: Secondary | ICD-10-CM | POA: Insufficient documentation

## 2016-07-27 DIAGNOSIS — R0602 Shortness of breath: Secondary | ICD-10-CM

## 2016-07-27 DIAGNOSIS — R600 Localized edema: Secondary | ICD-10-CM

## 2016-07-27 DIAGNOSIS — I5042 Chronic combined systolic (congestive) and diastolic (congestive) heart failure: Secondary | ICD-10-CM

## 2016-07-27 DIAGNOSIS — I110 Hypertensive heart disease with heart failure: Secondary | ICD-10-CM | POA: Insufficient documentation

## 2016-07-27 MED ORDER — FUROSEMIDE 20 MG PO TABS: 20.0000 mg | ORAL_TABLET | Freq: Every day | ORAL | 3 refills | Status: DC

## 2016-07-27 MED ORDER — BISOPROLOL FUMARATE 5 MG PO TABS: 5.0000 mg | ORAL_TABLET | Freq: Every day | ORAL | 2 refills | Status: DC

## 2016-07-27 MED ORDER — LOSARTAN POTASSIUM 25 MG PO TABS
ORAL_TABLET | ORAL | 5 refills | Status: DC
Start: 2016-07-27 — End: 2016-08-10

## 2016-07-27 MED FILL — BISOPROLOL FUMARATE 5 MG TA: 5 | 30 days supply | Qty: 30 | Fill #1

## 2016-07-27 MED FILL — ?FUROSEMIDE 20 MG TABLET: 20 | 30 days supply | Qty: 90 | Fill #0

## 2016-07-27 NOTE — Patient Instructions (Signed)
I have reviewed every medication with you and have done a medication reconciliation. Please take the medications which I have listed for you on the medication list. Return here in one week. Go to the emergency room should your symptoms continue or worsen.  Heart Failure Heart failure is a condition in which the heart has trouble pumping blood because it has become weak or stiff. This means that the heart does not pump blood efficiently for the body to work well. For some people with heart failure, fluid may back up into the lungs and there may be swelling (edema) in the lower legs. Heart failure is usually a long-term (chronic) condition. It is important for you to take good care of yourself and follow the treatment plan from your health care provider. What are the causes? This condition is caused by some health problems, including:  High blood pressure (hypertension). Hypertension causes the heart muscle to work harder than normal. High blood pressure eventually causes the heart to become stiff and weak.  Coronary artery disease (CAD). CAD is the buildup of cholesterol and fat (plaques) in the arteries of the heart.  Heart attack (myocardial infarction). Injured tissue, which is caused by the heart attack, does not contract as well and the heart's ability to pump blood is weakened.  Abnormal heart valves. When the heart valves do not open and close properly, the heart muscle must pump harder to keep the blood flowing.  Heart muscle disease (cardiomyopathy or myocarditis). Heart muscle disease is damage to the heart muscle from a variety of causes, such as drug or alcohol abuse, infections, or unknown causes. These can increase the risk of heart failure.  Lung disease. When the lungs do not work properly, the heart must work harder. What increases the risk? Risk of heart failure increases as a person ages. This condition is also more likely to develop in people who:  Are overweight.  Are  male.  Smoke or chew tobacco.  Abuse alcohol or illegal drugs.  Have taken medicines that can damage the heart, such as chemotherapy drugs.  Have diabetes.  High blood sugar (glucose) is associated with high fat (lipid) levels in the blood.  Diabetes can also damage tiny blood vessels that carry nutrients to the heart muscle.  Have abnormal heart rhythms.  Have thyroid problems.  Have low blood counts (anemia). What are the signs or symptoms? Symptoms of this condition include:  Shortness of breath with activity, such as when climbing stairs.  Persistent cough.  Swelling of the feet, ankles, legs, or abdomen.  Unexplained weight gain.  Difficulty breathing when lying flat (orthopnea).  Waking from sleep because of the need to sit up and get more air.  Rapid heartbeat.  Fatigue and loss of energy.  Feeling light-headed, dizzy, or close to fainting.  Loss of appetite.  Nausea.  Increased urination during the night (nocturia).  Confusion. How is this diagnosed? This condition is diagnosed based on:  Medical history, symptoms, and a physical exam.  Diagnostic tests, which may include:  Echocardiogram.  Electrocardiogram (ECG).  Chest X-ray.  Blood tests.  Exercise stress test.  Radionuclide scans.  Cardiac catheterization and angiogram. How is this treated? Treatment for this condition is aimed at managing the symptoms of heart failure. Medicines, behavioral changes, or other treatments may be necessary to treat heart failure. Medicines  These may include:  Angiotensin-converting enzyme (ACE) inhibitors. This type of medicine blocks the effects of a blood protein called angiotensin-converting enzyme. ACE inhibitors relax (  dilate) the blood vessels and help to lower blood pressure.  Angiotensin receptor blockers (ARBs). This type of medicine blocks the actions of a blood protein called angiotensin. ARBs dilate the blood vessels and help to lower  blood pressure.  Water pills (diuretics). Diuretics cause the kidneys to remove salt and water from the blood. The extra fluid is removed through urination, leaving a lower volume of blood that the heart has to pump.  Beta blockers. These improve heart muscle strength and they prevent the heart from beating too quickly.  Digoxin. This increases the force of the heartbeat. Healthy behavior changes  These may include:  Reaching and maintaining a healthy weight.  Stopping smoking or chewing tobacco.  Eating heart-healthy foods.  Limiting or avoiding alcohol.  Stopping use of street drugs (illegal drugs).  Physical activity. Other treatments  These may include:  Surgery to open blocked coronary arteries or repair damaged heart valves.  Placement of a biventricular pacemaker to improve heart muscle function (cardiac resynchronization therapy). This device paces both the right ventricle and left ventricle.  Placement of a device to treat serious abnormal heart rhythms (implantable cardioverter defibrillator, or ICD).  Placement of a device to improve the pumping ability of the heart (left ventricular assist device, or LVAD).  Heart transplant. This can cure heart failure, and it is considered for certain patients who do not improve with other therapies. Follow these instructions at home: Medicines  Take over-the-counter and prescription medicines only as told by your health care provider. Medicines are important in reducing the workload of your heart, slowing the progression of heart failure, and improving your symptoms.  Do not stop taking your medicine unless your health care provider told you to do that.  Do not skip any dose of medicine.  Refill your prescriptions before you run out of medicine. You need your medicines every day. Eating and drinking  Eat heart-healthy foods. Talk with a dietitian to make an eating plan that is right for you.  Choose foods that contain no  trans fat and are low in saturated fat and cholesterol. Healthy choices include fresh or frozen fruits and vegetables, fish, lean meats, legumes, fat-free or low-fat dairy products, and whole-grain or high-fiber foods.  Limit salt (sodium) if directed by your health care provider. Sodium restriction may reduce symptoms of heart failure. Ask a dietitian to recommend heart-healthy seasonings.  Use healthy cooking methods instead of frying. Healthy methods include roasting, grilling, broiling, baking, poaching, steaming, and stir-frying.  Limit your fluid intake if directed by your health care provider. Fluid restriction may reduce symptoms of heart failure. Lifestyle  Stop smoking or using chewing tobacco. Nicotine and tobacco can damage your heart and your blood vessels. Do not use nicotine gum or patches before talking to your health care provider.  Limit alcohol intake to no more than 1 drink per day for non-pregnant women and 2 drinks per day for men. One drink equals 12 oz of beer, 5 oz of wine, or 1 oz of hard liquor.  Drinking more than that is harmful to your heart. Tell your health care provider if you drink alcohol several times a week.  Talk with your health care provider about whether any level of alcohol use is safe for you.  If your heart has already been damaged by alcohol or you have severe heart failure, drinking alcohol should be stopped completely.  Stop use of illegal drugs.  Lose weight if directed by your health care provider. Weight loss  may reduce symptoms of heart failure.  Do moderate physical activity if directed by your health care provider. People who are elderly and people with severe heart failure should consult with a health care provider for physical activity recommendations. Monitor important information  Weigh yourself every day. Keeping track of your weight daily helps you to notice excess fluid sooner.  Weigh yourself every morning after you urinate and  before you eat breakfast.  Wear the same amount of clothing each time you weigh yourself.  Record your daily weight. Provide your health care provider with your weight record.  Monitor and record your blood pressure as told by your health care provider.  Check your pulse as told by your health care provider. Dealing with extreme temperatures  If the weather is extremely hot:  Avoid vigorous physical activity.  Use air conditioning or fans or seek a cooler location.  Avoid caffeine and alcohol.  Wear loose-fitting, lightweight, and light-colored clothing.  If the weather is extremely cold:  Avoid vigorous physical activity.  Layer your clothes.  Wear mittens or gloves, a hat, and a scarf when you go outside.  Avoid alcohol. General instructions  Manage other health conditions such as hypertension, diabetes, thyroid disease, or abnormal heart rhythms as told by your health care provider.  Learn to manage stress. If you need help to do this, ask your health care provider.  Plan rest periods when fatigued.  Get ongoing education and support as needed.  Participate in or seek rehabilitation as needed to maintain or improve independence and quality of life.  Stay up to date with immunizations. Keeping current on pneumococcal and influenza immunizations is especially important to prevent respiratory infections.  Keep all follow-up visits as told by your health care provider. This is important. Contact a health care provider if:  You have a rapid weight gain.  You have increasing shortness of breath that is unusual for you.  You are unable to participate in your usual physical activities.  You tire easily.  You cough more than normal, especially with physical activity.  You have any swelling or more swelling in areas such as your hands, feet, ankles, or abdomen.  You are unable to sleep because it is hard to breathe.  You feel like your heart is beating quickly  (palpitations).  You become dizzy or light-headed when you stand up. Get help right away if:  You have difficulty breathing.  You notice or your family notices a change in your awareness, such as having trouble staying awake or having difficulty with concentration.  You have pain or discomfort in your chest.  You have an episode of fainting (syncope). This information is not intended to replace advice given to you by your health care provider. Make sure you discuss any questions you have with your health care provider. Document Released: 05/28/2005 Document Revised: 01/31/2016 Document Reviewed: 12/21/2015 Elsevier Interactive Patient Education  2017 [employer].

## 2016-07-27 NOTE — Telephone Encounter (Signed)
Call placed to the patient to follow up as he missed his [facility] appointment yesterday.  He stated that he did not have a ride to the clinic. Informed him that as part of the TCC program , cab transportation can be arranged.  He stated that he is short of breath all of the time and both of his feet and legs are edematous and he is having difficulty walking. He reported that he is not wheezing or coughing.   He said he is very confused about his medications because everyone is telling him something different. He said that he currently is taking lasix 40 mg twice a day. He noted that he is using the bevespi  2 puffs twice a day and the albuterol inhaler 2 puffs twice a day. Informed him that the orders for albuterol are 2 puffs every 6 hours as needed.    He also said that he weighed 204 lbs this morning and was about 200 lbs yesterday; but he is not keeping a log.   He was agreeable to rescheduling an appointment with Dr Venetia Night on 07/30/16 @ 1400. Discussed the possible need to return to the ED due his shortness of breath and said that he does not need/want to go because " they don't do anything" noting that he has been there with the same complaints before. Offered him an appointment today with Sindy Messing, PA who he had seen last week and he was agreeable and cab transportation was arranged by Aldean Jewett, [facility] scheduler. This visit was approved by Sindy Messing, PA and Horald Chestnut, [facility] front office team lead.  Informed the patient to seek medical attention in the ED if his shortness breath becomes worse before his appointment this afternoon and he said that he understood.  Instructed him to bring all of his medications with him to his appointment.   Message routed to Dr Venetia Night.

## 2016-07-27 NOTE — Progress Notes (Signed)
Subjective:  Patient ID: Jared [name], male    DOB: Dec 04, 1955  Age: 61 y.o. MRN: [id number]  CC: Follow-up emergency department visit  HPI Jared [name] is a 61 y.o. male with a PMH of CAD, CHF, HTN, and COPD presents on follow-up of emergency room visit 2 days ago. He was diagnosed with near syncope, alcoholic intoxication without complication, fall, elevated LFTs, and congestive heart failure. He also had a pulmonology visit 3 days ago in which Dr. [name] discontinued carvedilol and began Bisoprolol 5mg  qday. Patient is currently confused about which medicines to take. Has brought all his medications with him for review. Complains of lower extremity edema, shortness of breath, and the need to use continuous oxygen. Denies chest pain, palpitations, headache, abdominal pain, fever, chills, nausea, vomiting, rash, or GI/GU symptoms.    Outpatient Medications Prior to Visit  Medication Sig Dispense Refill  . albuterol (PROVENTIL HFA;VENTOLIN HFA) 108 (90 Base) MCG/ACT inhaler Inhale 2 puffs into the lungs every 6 (six) hours as needed for wheezing or shortness of breath. 1 Inhaler 11  . Glycopyrrolate-Formoterol (BEVESPI AEROSPHERE) 9-4.8 MCG/ACT AERO Inhale 2 puffs into the lungs 2 (two) times daily.    . OXYGEN 2lpm 24/7    . potassium chloride SA (K-DUR,KLOR-CON) 10 MEQ tablet Take 1 tablet (10 mEq total) by mouth daily. 30 tablet 0  . warfarin (COUMADIN) 7.5 MG tablet Take as directed by coumadin clinic 30 tablet 2  . carvedilol (COREG) 25 MG tablet Take 1 tablet (25 mg total) by mouth 2 (two) times daily. 60 tablet 2  . furosemide (LASIX) 40 MG tablet Take 40 mg by mouth 2 (two) times daily.    Marland Kitchen ibuprofen (ADVIL,MOTRIN) 200 MG tablet Take 200 mg by mouth every 6 (six) hours as needed.    Marland Kitchen losartan (COZAAR) 25 MG tablet Take half tablet by mouth daily. 30 tablet 0   No facility-administered medications prior to visit.      ROS Review of Systems  Constitutional: Negative for chills,  fever and malaise/fatigue.  Eyes: Negative for blurred vision.  Respiratory: Positive for shortness of breath.   Cardiovascular: Positive for orthopnea and leg swelling. Negative for chest pain and palpitations.  Gastrointestinal: Negative for abdominal pain and nausea.  Genitourinary: Negative for dysuria and hematuria.  Musculoskeletal: Negative for joint pain and myalgias.  Skin: Negative for rash.  Neurological: Negative for tingling and headaches.  Psychiatric/Behavioral: Negative for depression. The patient is not nervous/anxious.     Objective:  BP 124/82 (BP Location: Right Arm, Patient Position: Sitting, Cuff Size: Normal)   Pulse 85   Temp 98.3 F (36.8 C) (Oral)   Resp 20   Wt 220 lb (99.8 kg)   SpO2 98% Comment: 2LOxygen  BMI 27.50 kg/m   BP/Weight 07/27/2016 07/25/2016 07/24/2016  Systolic BP 124 117 128  Diastolic BP 82 87 74  Wt. (Lbs) 220 214 209  BMI 27.5 26.75 26.12      Physical Exam  Constitutional: He is oriented to person, place, and time.  NAD, on O2 through nasal cannula, polite  Eyes: No scleral icterus.  Neck: No thyromegaly present.  Cardiovascular:  Normal rate, no murmurs rubs or gallops.  Pulmonary/Chest:  Mildly increased respiratory effort, diffuse mild rales heard bilaterally with mild wheezing  Abdominal: Soft.  Musculoskeletal: He exhibits edema (3+ pitting edema bilaterally up to the mid tib-fib).  Neurological: He is alert and oriented to person, place, and time.  Skin: Skin is warm and dry. No rash  noted.  Psychiatric: He has a normal mood and affect. His behavior is normal. Thought content normal.     Assessment & Plan:   1. CHF -Medications reconciled by me. Adjustments made today while considering changes that pulmonology wanted to make. Carvedilol discontinued and Bisoprolol 5mg  qday Rx'd per pulmonologist recommendation. Will assess effectiveness of 5 mg dose in 1 week. We may likely increase dosage if patient tolerates and  there are no side effects.  2. Bilateral lower extremity edema -Patient taking less than the recommended dose of Lasix. Increase from 40 mg to 60 mg per day.  3. Shortness of breath -Likely related to his CHF. Emergency room visit 2 days ago showed no changes on CXR or EKG. Troponin negative at Ed.   Meds ordered this encounter  Medications  . bisoprolol (ZEBETA) 5 MG tablet    Sig: Take 1 tablet (5 mg total) by mouth daily.    Dispense:  30 tablet    Refill:  2    Order Specific Question:   Supervising Provider    Answer:   Quentin Angst [id number]  . furosemide (LASIX) 20 MG tablet    Sig: Take 1 tablet (20 mg total) by mouth daily. Take two tablets by mouth in the morning and one in the evening.    Dispense:  90 tablet    Refill:  3    Order Specific Question:   Supervising Provider    Answer:   Quentin Angst [id number]  . losartan (COZAAR) 25 MG tablet    Sig: Take half tablet by mouth daily.    Dispense:  30 tablet    Refill:  5    Order Specific Question:   Supervising Provider    Answer:   Quentin Angst [id number]    Follow-up: Return in about 1 week (around 08/03/2016).   Loletta Specter PA

## 2016-07-30 ENCOUNTER — Ambulatory Visit: Payer: Self-pay | Attending: Family Medicine | Admitting: Family Medicine

## 2016-07-30 ENCOUNTER — Telehealth: Payer: Self-pay

## 2016-07-30 ENCOUNTER — Encounter: Payer: Self-pay | Admitting: Family Medicine

## 2016-07-30 VITALS — BP 130/81 | HR 79 | Temp 98.2°F | Resp 18 | Ht 75.0 in | Wt 218.0 lb

## 2016-07-30 DIAGNOSIS — I5022 Chronic systolic (congestive) heart failure: Secondary | ICD-10-CM

## 2016-07-30 DIAGNOSIS — I2510 Atherosclerotic heart disease of native coronary artery without angina pectoris: Secondary | ICD-10-CM | POA: Insufficient documentation

## 2016-07-30 DIAGNOSIS — I10 Essential (primary) hypertension: Secondary | ICD-10-CM

## 2016-07-30 DIAGNOSIS — I429 Cardiomyopathy, unspecified: Secondary | ICD-10-CM | POA: Insufficient documentation

## 2016-07-30 DIAGNOSIS — J9611 Chronic respiratory failure with hypoxia: Secondary | ICD-10-CM | POA: Insufficient documentation

## 2016-07-30 DIAGNOSIS — Z7901 Long term (current) use of anticoagulants: Secondary | ICD-10-CM | POA: Insufficient documentation

## 2016-07-30 DIAGNOSIS — I110 Hypertensive heart disease with heart failure: Secondary | ICD-10-CM | POA: Insufficient documentation

## 2016-07-30 DIAGNOSIS — Z79899 Other long term (current) drug therapy: Secondary | ICD-10-CM | POA: Insufficient documentation

## 2016-07-30 DIAGNOSIS — I483 Typical atrial flutter: Secondary | ICD-10-CM | POA: Insufficient documentation

## 2016-07-30 DIAGNOSIS — Z9889 Other specified postprocedural states: Secondary | ICD-10-CM | POA: Insufficient documentation

## 2016-07-30 DIAGNOSIS — J449 Chronic obstructive pulmonary disease, unspecified: Secondary | ICD-10-CM | POA: Insufficient documentation

## 2016-07-30 DIAGNOSIS — I5042 Chronic combined systolic (congestive) and diastolic (congestive) heart failure: Secondary | ICD-10-CM | POA: Insufficient documentation

## 2016-07-30 DIAGNOSIS — Z8673 Personal history of transient ischemic attack (TIA), and cerebral infarction without residual deficits: Secondary | ICD-10-CM | POA: Insufficient documentation

## 2016-07-30 LAB — COMPLETE METABOLIC PANEL WITH GFR
ALT: 72 U/L — ABNORMAL HIGH (ref 9–46)
AST: 53 U/L — ABNORMAL HIGH (ref 10–35)
Albumin: 3.5 g/dL — ABNORMAL LOW (ref 3.6–5.1)
Alkaline Phosphatase: 152 U/L — ABNORMAL HIGH (ref 40–115)
BUN: 15 mg/dL (ref 7–25)
CO2: 43 mmol/L — ABNORMAL HIGH (ref 20–31)
Calcium: 8.5 mg/dL — ABNORMAL LOW (ref 8.6–10.3)
Chloride: 100 mmol/L (ref 98–110)
Creat: 1.2 mg/dL (ref 0.70–1.25)
GFR, Est African American: 74 mL/min (ref >=60)
GFR, Est Non African American: 64 mL/min (ref >=60)
Glucose, Bld: 91 mg/dL (ref 65–99)
Potassium: 4 mmol/L (ref 3.5–5.3)
Sodium: 148 mmol/L — ABNORMAL HIGH (ref 135–146)
Total Bilirubin: 0.8 mg/dL (ref 0.2–1.2)
Total Protein: 5.6 g/dL — ABNORMAL LOW (ref 6.1–8.1)

## 2016-07-30 MED ORDER — FUROSEMIDE 40 MG PO TABS: 40.0000 mg | ORAL_TABLET | Freq: Two times a day (BID) | ORAL | 3 refills | Status: DC

## 2016-07-30 MED FILL — ?FUROSEMIDE 40 MG TABLET: 40 | 30 days supply | Qty: 60 | Fill #0

## 2016-07-30 NOTE — Progress Notes (Signed)
Subjective:  Patient ID: Jared [name], male    DOB: 1955/09/13  Age: 61 y.o. MRN: [id number]  CC: Congestive Heart Failure   HPI Jared [name] is a 61 year old male with a history of hypertension, COPD, tobacco abuse, NICM , CHF (EF 30-35% from 2-D echo 06/2016), atrial flutter (currently on anticoagulation with Coumadin) who comes in today for a follow-up visit.  At his last visit with the PA his carvedilol was switched to bisoprolol which he has been compliant with and his blood pressure is controlled. He complains of "feeling swollen"with pedal edema which she states extends to his thighs and his abdomen. He has been compliant with restricting his fluid intake; taking 40 mg of Lasix in the morning and 30 mg in the evening. Review of his chart indicates a weight loss of 2 pounds in the last 3 days.  Last visit to pulmonary was on 07/24/16 and he remains on Proventil inhaler, Bevespi. He does have a baseline shortness of breath and remains 2 L of oxygen at rest.   Past Medical History:  Diagnosis Date  . CAD (coronary artery disease)    a. LHC 5/12:  LAD 20, pLCx 20, pRCA 40, dRCA 40, EF 35%, diff HK  //  b. Myoview 4/16: Overall Impression: High risk stress nuclear study There is no evidence of ischemia. There is severe LV dysfunction. LV Ejection Fraction: 30%. LV Wall Motion: There is global LV hypokinesis.   Marland Kitchen CAP (community acquired pneumonia) 09/2013  . Chronic combined systolic and diastolic CHF (congestive heart failure) (HCC)    a. Echo 4/16:Mild LVH, EF 40-45%, diffuse HK //  b. Echo 8/17: EF 35-40%, diffuse HK, diastolic dysfunction, aortic sclerosis, trivial MR, moderate LAE, normal RVSF, moderate RAE, mild TR, PASP 42 mmHg  . Cluster headache    "hx; haven't had one in awhile" (01/09/2016)  . COPD (chronic obstructive pulmonary disease) (HCC)    [name] 01/09/2016  . History of CVA (cerebrovascular accident)   . Hypertension   . NICM (nonischemic cardiomyopathy) (HCC)   .  Tobacco abuse     Past Surgical History:  Procedure Laterality Date  . CARDIAC CATHETERIZATION  10/2010   LM normal, LAD with 20% irregularities, LCX with 20%, RCA with 40% prox and 40% distal - EF of 35%  . COLONOSCOPY W/ BIOPSIES AND POLYPECTOMY    . VIDEO BRONCHOSCOPY Bilateral 05/08/2016   Procedure: VIDEO BRONCHOSCOPY WITH FLUORO;  Surgeon: Oretha Milch, MD;  Location: [facility];  Service: Cardiopulmonary;  Laterality: Bilateral;     Outpatient Medications Prior to Visit  Medication Sig Dispense Refill  . albuterol (PROVENTIL HFA;VENTOLIN HFA) 108 (90 Base) MCG/ACT inhaler Inhale 2 puffs into the lungs every 6 (six) hours as needed for wheezing or shortness of breath. 1 Inhaler 11  . bisoprolol (ZEBETA) 5 MG tablet Take 1 tablet (5 mg total) by mouth daily. 30 tablet 2  . Glycopyrrolate-Formoterol (BEVESPI AEROSPHERE) 9-4.8 MCG/ACT AERO Inhale 2 puffs into the lungs 2 (two) times daily.    Marland Kitchen losartan (COZAAR) 25 MG tablet Take half tablet by mouth daily. 30 tablet 5  . OXYGEN 2lpm 24/7    . potassium chloride SA (K-DUR,KLOR-CON) 10 MEQ tablet Take 1 tablet (10 mEq total) by mouth daily. 30 tablet 0  . warfarin (COUMADIN) 7.5 MG tablet Take as directed by coumadin clinic 30 tablet 2  . furosemide (LASIX) 20 MG tablet Take 1 tablet (20 mg total) by mouth daily. Take two tablets by mouth in the  morning and one in the evening. 90 tablet 3   No facility-administered medications prior to visit.     ROS Review of Systems  Constitutional: Negative for activity change and appetite change.  HENT: Negative for sinus pressure and sore throat.   Eyes: Negative for visual disturbance.  Respiratory: Positive for shortness of breath. Negative for cough and chest tightness.   Cardiovascular: Positive for leg swelling. Negative for chest pain.  Gastrointestinal: Negative for abdominal distention, abdominal pain, constipation and diarrhea.  Endocrine: Negative.   Genitourinary: Negative for  dysuria.  Musculoskeletal: Negative for joint swelling and myalgias.  Skin: Negative for rash.  Allergic/Immunologic: Negative.   Neurological: Negative for weakness, light-headedness and numbness.  Psychiatric/Behavioral: Negative for dysphoric mood and suicidal ideas.    Objective:  BP 130/81 (BP Location: Right Arm, Patient Position: Sitting, Cuff Size: Normal)   Pulse 79   Temp 98.2 F (36.8 C) (Oral)   Resp 18   Ht 6\' 3"  (1.905 m)   Wt 218 lb (98.9 kg)   SpO2 94% Comment: 2L  BMI 27.25 kg/m   BP/Weight 07/30/2016 07/27/2016 07/25/2016  Systolic BP 130 124 117  Diastolic BP 81 82 87  Wt. (Lbs) 218 220 214  BMI 27.25 27.5 26.75   Wt Readings from Last 3 Encounters:  07/30/16 218 lb (98.9 kg)  07/27/16 220 lb (99.8 kg)  07/25/16 214 lb (97.1 kg)       Physical Exam  Constitutional: He is oriented to person, place, and time. He appears well-developed and well-nourished.  HENT:  2L Oxygen via nasal canula  Neck: No JVD present.  Cardiovascular: Normal rate, normal heart sounds and intact distal pulses.   No murmur heard. Pulmonary/Chest: Effort normal and breath sounds normal. He has no wheezes. He has no rales. He exhibits no tenderness.  Abdominal: Soft. Bowel sounds are normal. He exhibits no distension and no mass. There is no tenderness.  Musculoskeletal: Normal range of motion. He exhibits edema (1+ plus pitting pedal edema bilateral).  Neurological: He is alert and oriented to person, place, and time.     Assessment & Plan:   1. Essential hypertension Controlled Continue bisoprolol, low-sodium diet  2. COPD GOLD III/still smoking  Ongoing smoking not helping-advised that discontinuing smoking will retard progression of condition Continue Proventil MDI and Bevespi Appointment with pulmonary 10/2016  3. Chronic respiratory failure with hypoxia (HCC) Continue 2 L oxygen at rest  4. Typical atrial flutter (HCC) Currently on anticoagulation with  Coumadin Last INR was 3.0 Has upcoming appointment at the Coumadin clinic ? Plan for DCCV   5. Chronic systolic CHF (congestive heart failure) (HCC) EF of 35-40%,NYHA III Slight fluid overload evidenced by pedal edema Increased dose of Lasix to 40 mg twice daily Stressed the importance of restricting fluid intake to less than 2 L per day, daily weight checks Sees cardiology in 1 week - Brain natriuretic peptide - COMPLETE METABOLIC PANEL WITH GFR - furosemide (LASIX) 40 MG tablet; Take 1 tablet (40 mg total) by mouth 2 (two) times daily.  Dispense: 60 tablet; Refill: 3  Need to follow-up with abdominal imaging next month to assess for questionable congestive hepatopathy  versus hepatic cirrhosis.  Meds ordered this encounter  Medications  . furosemide (LASIX) 40 MG tablet    Sig: Take 1 tablet (40 mg total) by mouth 2 (two) times daily.    Dispense:  60 tablet    Refill:  3    Discontinue previous dose  Follow-up: 3 weeks for follow-up on chronic medical conditions   Jaclyn Shaggy MD

## 2016-07-30 NOTE — Telephone Encounter (Signed)
Call placed to the patient to check on his status. He stated that he is feeling ' okay" and  said that he continues to experience episodes of shortness of breath and uses his O2 @ 2L/min continuously.  He said that he has all of his medications and has been taking them as ordered, noting that he is taking lasix 40 mg 2 tablets in the morning and 1 tablet in the evening. He stated that he has the written instructions from his clinic appointment on Friday, 07/27/16. He stated that he has weighed himself, but not yet today.  He could not state what his weight was yesterday but said that it is " going up."  He confirmed that he will be at his appointment at [facility] with Dr Venetia Night this afternoon at 1400 and he has transportation to the clinic.    No other questions/concerns reported at this time.

## 2016-07-30 NOTE — Progress Notes (Signed)
Patient is here for CHF FU  Patient denies pain at this time.  Patient has taken medication today. Patient has eaten today.

## 2016-07-31 LAB — BRAIN NATRIURETIC PEPTIDE: Brain Natriuretic Peptide: 1458.6 pg/mL — ABNORMAL HIGH (ref <100)

## 2016-08-02 ENCOUNTER — Telehealth: Payer: Self-pay

## 2016-08-02 NOTE — Telephone Encounter (Signed)
Message received from Dr Venetia Night requesting the patient be informed to increase his lasix to 60 mg ( one and a half tablets ) twice a day due to his elevated BNP until he is seen by cardiology.  Call placed to the patient and instructed him to increase his lasix as ordered by Dr Venetia Night until he sees cardiology and he has a cardiology appointment scheduled for tomorrow - 08/03/16. He stated that he understood and was appreciative of the call.

## 2016-08-03 ENCOUNTER — Encounter (INDEPENDENT_AMBULATORY_CARE_PROVIDER_SITE_OTHER): Payer: Self-pay

## 2016-08-03 ENCOUNTER — Telehealth: Payer: Self-pay | Admitting: *Deleted

## 2016-08-03 ENCOUNTER — Ambulatory Visit (INDEPENDENT_AMBULATORY_CARE_PROVIDER_SITE_OTHER): Payer: Self-pay | Admitting: *Deleted

## 2016-08-03 ENCOUNTER — Encounter: Payer: Self-pay | Admitting: Physician Assistant

## 2016-08-03 ENCOUNTER — Ambulatory Visit (INDEPENDENT_AMBULATORY_CARE_PROVIDER_SITE_OTHER): Payer: Self-pay | Admitting: Physician Assistant

## 2016-08-03 VITALS — BP 172/80 | HR 79 | Ht 75.0 in | Wt 216.0 lb

## 2016-08-03 DIAGNOSIS — R188 Other ascites: Secondary | ICD-10-CM

## 2016-08-03 DIAGNOSIS — I429 Cardiomyopathy, unspecified: Secondary | ICD-10-CM

## 2016-08-03 DIAGNOSIS — Z5181 Encounter for therapeutic drug level monitoring: Secondary | ICD-10-CM

## 2016-08-03 DIAGNOSIS — I10 Essential (primary) hypertension: Secondary | ICD-10-CM

## 2016-08-03 DIAGNOSIS — K7460 Unspecified cirrhosis of liver: Secondary | ICD-10-CM

## 2016-08-03 DIAGNOSIS — I110 Hypertensive heart disease with heart failure: Secondary | ICD-10-CM

## 2016-08-03 DIAGNOSIS — I5042 Chronic combined systolic (congestive) and diastolic (congestive) heart failure: Secondary | ICD-10-CM

## 2016-08-03 DIAGNOSIS — I2510 Atherosclerotic heart disease of native coronary artery without angina pectoris: Secondary | ICD-10-CM

## 2016-08-03 DIAGNOSIS — I483 Typical atrial flutter: Secondary | ICD-10-CM

## 2016-08-03 DIAGNOSIS — R55 Syncope and collapse: Secondary | ICD-10-CM

## 2016-08-03 DIAGNOSIS — I420 Dilated cardiomyopathy: Secondary | ICD-10-CM

## 2016-08-03 LAB — BASIC METABOLIC PANEL
BUN/Creatinine Ratio: 17 (ref 10–24)
BUN: 18 mg/dL (ref 8–27)
CO2: 47 mmol/L (ref 18–29)
Calcium: 9.5 mg/dL (ref 8.6–10.2)
Chloride: 98 mmol/L (ref 96–106)
Creatinine, Ser: 1.07 mg/dL (ref 0.76–1.27)
GFR calc Af Amer: 86 mL/min/{1.73_m2} (ref >59)
GFR calc non Af Amer: 74 mL/min/{1.73_m2} (ref >59)
Glucose: 79 mg/dL (ref 65–99)
Potassium: 4.1 mmol/L (ref 3.5–5.2)
Sodium: 145 mmol/L — ABNORMAL HIGH (ref 134–144)

## 2016-08-03 LAB — POCT INR: INR: 2.4

## 2016-08-03 MED ORDER — FUROSEMIDE 40 MG PO TABS: 40.0000 mg | ORAL_TABLET | Freq: Two times a day (BID) | ORAL | 3 refills | Status: DC

## 2016-08-03 MED ORDER — FUROSEMIDE 40 MG PO TABS
80.0000 mg | ORAL_TABLET | Freq: Two times a day (BID) | ORAL | 3 refills | Status: DC
Start: 2016-08-03 — End: 2016-08-03

## 2016-08-03 MED ORDER — SPIRONOLACTONE 25 MG PO TABS
25.0000 mg | ORAL_TABLET | Freq: Every day | ORAL | 3 refills | Status: DC
Start: 2016-08-03 — End: 2016-10-22

## 2016-08-03 MED FILL — SPIRONOLACTONE 25 MG TABLET: 25 | 30 days supply | Qty: 30 | Fill #0

## 2016-08-03 MED FILL — FUROSEMIDE 40 MG TABLET: 40 | 30 days supply | Qty: 120 | Fill #0

## 2016-08-03 NOTE — Progress Notes (Signed)
Cardiology Office Note:    Date:  08/03/2016   ID:  Jared [name], DOB 02-Nov-1955, MRN [id number]  PCP:  Jared Shaggy, MD  Cardiologist:  Dr. Dietrich Pates  Electrophysiologist:  n/a Pulmonology: Dr. [name]  Referring MD: Jared Shaggy, MD   Chief Complaint  Patient presents with  . Follow-up    CHF, atrial flutter    History of Present Illness:    Jared [name] is a 61 y.o. male with a hx of combined systolic and diastolic CHF, nonischemic cardiomyopathy with EF ~35%, mild nonobstructive CAD at cardiac catheterization in 2012, prior CVA, HTN, tobacco abuse, COPD.   He was admitted in 1/18 with a/c combined systolic and diastolic HF in the setting of AFlutter.  EF was 30-35 on echo.  He had cirrhotic changes on abdominal US.  He was placed on Coumadin Rx. He FU 07/13/16 with me and was still in AFlutter.  I adjusted his beta-blocker and recommended close follow up with an eye towards DCCV after 3-4 weeks of therapeutic anticoagulation.    He was seen in the ED 07/25/16 with ?syncope/near syncope.  ECG demonstrated NSR.  ETOH level was high (70).   BNP was high at 549.   Troponin was neg x 1. Of note, LFTs have remained high.  He FU with his PCP earlier this week and he was volume overloaded.  His Lasix was adjusted.  Of note, his BNP was > 1400.    CHADS2-VASc=5 (CHF, CVA, HTN, vascular dz).     He returns for follow up.  He is here alone. He notes continued dyspnea with mild to moderate activities. He notes orthopnea as well as increasing LE edema. He denies PND. He notes occasional cough and wheezing. He notes right upper quadrant pain which has gone on for several months now. He has occasional chest discomfort. This is left-sided and typically brief. It is not exertional. He denies associated symptoms. It is mainly unchanged. He does note increasing abdominal girth. His dose of Lasix is not entirely clear. Notes indicate his Lasix was increased to 40 mg twice a day. He tells me he was increased  to 80 mg twice a day and then called after blood work to decrease it to 80 mg in the morning and 60 mg in the afternoon.  He denies drinking alcohol since November. He denies any recent blood in his stool.  Prior CV studies:   The following studies were reviewed today:  Echo 07/04/16  Moderate LVH, EF 30-35, diffuse HK aortic sclerosis, trivial MR, moderate LAE, mildly reduced RVSF, moderate RAE, mild TR, PASP 35, small pericardial effusion  Echo 01/10/16 EF 35-40%, diffuse HK, diastolic dysfunction, aortic sclerosis, trivial MR, moderate LAE, normal RVSF, moderate RAE, mild TR, PASP 42 mmHg  Myoview 4/16 Overall Impression: High risk stress nuclear study There is no evidence of ischemia. There is severe LV dysfunction. LV Ejection Fraction: 30%. LV Wall Motion: There is global LV hypokinesis.   Echo 4/16 Mild LVH, EF 40-45%, diffuse HK  Cardiac cath(10/11/2010) Done for [facility]) Known cardiomyopathy prior. LM normal  LAD 20% irregularities LCX 20% proximal RCA: Dominant; 40% proximal, 40% distal LVEF 35% with diffuse hypokinesis.  Past Medical History:  Diagnosis Date  . CAD (coronary artery disease)    a. LHC 5/12:  LAD 20, pLCx 20, pRCA 40, dRCA 40, EF 35%, diff HK  //  b. Myoview 4/16: Overall Impression: High risk stress nuclear study There is no evidence of ischemia.  There is severe LV dysfunction. LV Ejection Fraction: 30%. LV Wall Motion: There is global LV hypokinesis.   Marland Kitchen CAP (community acquired pneumonia) 09/2013  . Chronic combined systolic and diastolic CHF (congestive heart failure) (HCC)    a. Echo 4/16:Mild LVH, EF 40-45%, diffuse HK //  b. Echo 8/17: EF 35-40%, diffuse HK, diastolic dysfunction, aortic sclerosis, trivial MR, moderate LAE, normal RVSF, moderate RAE, mild TR, PASP 42 mmHg  . Cluster headache    "hx; haven't had one in awhile" (01/09/2016)  . COPD (chronic obstructive pulmonary disease) (HCC)    [name]  01/09/2016  . History of CVA (cerebrovascular accident)   . Hypertension   . NICM (nonischemic cardiomyopathy) (HCC)   . Tobacco abuse     Past Surgical History:  Procedure Laterality Date  . CARDIAC CATHETERIZATION  10/2010   LM normal, LAD with 20% irregularities, LCX with 20%, RCA with 40% prox and 40% distal - EF of 35%  . COLONOSCOPY W/ BIOPSIES AND POLYPECTOMY    . VIDEO BRONCHOSCOPY Bilateral 05/08/2016   Procedure: VIDEO BRONCHOSCOPY WITH FLUORO;  Surgeon: Oretha Milch, MD;  Location: [facility];  Service: Cardiopulmonary;  Laterality: Bilateral;    Current Medications: Current Meds  Medication Sig  . albuterol (PROVENTIL HFA;VENTOLIN HFA) 108 (90 Base) MCG/ACT inhaler Inhale 2 puffs into the lungs every 6 (six) hours as needed for wheezing or shortness of breath.  . bisoprolol (ZEBETA) 5 MG tablet Take 1 tablet (5 mg total) by mouth daily.  . Glycopyrrolate-Formoterol (BEVESPI AEROSPHERE) 9-4.8 MCG/ACT AERO Inhale 2 puffs into the lungs 2 (two) times daily.  Marland Kitchen losartan (COZAAR) 25 MG tablet Take half tablet by mouth daily.  . OXYGEN 2lpm 24/7  . potassium chloride SA (K-DUR,KLOR-CON) 10 MEQ tablet Take 1 tablet (10 mEq total) by mouth daily.  Marland Kitchen warfarin (COUMADIN) 7.5 MG tablet Take as directed by coumadin clinic  . [DISCONTINUED] furosemide (LASIX) 40 MG tablet Take 1 tablet (40 mg total) by mouth 2 (two) times daily.     Allergies:   Lisinopril   Social History   Social History  . Marital status: Single    Spouse name: N/A  . Number of children: N/A  . Years of education: N/A   Social History Main Topics  . Smoking status: Current Every Day Smoker    Packs/day: 0.25    Years: 42.00    Types: Cigarettes  . Smokeless tobacco: Never Used     Comment: 5 cigs daily  . Alcohol use 0.0 oz/week     Comment: drinks 1x per month on pay day  . Drug use: No     Comment: "nothing in 20 years"  . Sexual activity: Not Currently   Other Topics Concern  . None    Social History Narrative   unemployed        Family History  Problem Relation Age of Onset  . Cancer Mother   . Heart disease Mother   . Diabetes Mother   . Cancer Father   . Diabetes Father   . Diabetes Sister   . Diabetes Brother      ROS:   Please see the history of present illness.    Review of Systems  Constitution: Positive for weight gain.  Cardiovascular: Positive for chest pain, dyspnea on exertion and leg swelling.  Respiratory: Positive for shortness of breath and wheezing.   Gastrointestinal: Positive for abdominal pain.  Neurological: Positive for headaches.   All other systems reviewed and are negative.  EKGs/Labs/Other Test Reviewed:    EKG:  EKG is  ordered today.  The ekg ordered today demonstrates NSR, HR 79, inferior Q waves, left axis deviation, poor R-wave progression, QTc 440 ms  Recent Labs: 07/03/2016: Magnesium 1.7 07/04/2016: TSH 1.268 07/25/2016: Hemoglobin 13.7; Platelets 144 07/30/2016: ALT 72; Brain Natriuretic Peptide 1,458.6; BUN 15; Creat 1.20; Potassium 4.0; Sodium 148   Recent Lipid Panel    Component Value Date/Time   CHOL 147 01/11/2016 0446   TRIG 69 01/11/2016 0446   HDL 53 01/11/2016 0446   CHOLHDL 2.8 01/11/2016 0446   VLDL 14 01/11/2016 0446   LDLCALC 80 01/11/2016 0446     Physical Exam:    VS:  BP (!) 172/80 (BP Location: Right Arm, Patient Position: Sitting, Cuff Size: Normal)   Pulse 79   Ht 6\' 3"  (1.905 m)   Wt 216 lb (98 kg)   SpO2 96%   BMI 27.00 kg/m     Wt Readings from Last 3 Encounters:  08/03/16 216 lb (98 kg)  07/30/16 218 lb (98.9 kg)  07/27/16 220 lb (99.8 kg)     Physical Exam  Constitutional: He is oriented to person, place, and time. He appears well-developed and well-nourished. No distress.  HENT:  Head: Normocephalic and atraumatic.  Eyes: No scleral icterus.  Neck: Normal range of motion. JVD present.  JVP mildly elevated  Cardiovascular: Normal rate, regular rhythm, S1 normal and  S2 normal.   No murmur heard. Pulmonary/Chest: Effort normal. He has decreased breath sounds. He has no wheezes. He has no rhonchi. He has no rales.  Abdominal: Soft. He exhibits distension. There is hepatosplenomegaly. There is tenderness in the right upper quadrant.  Musculoskeletal: He exhibits edema.  1+ bilat LE edema L > R Trace presacral edema  Neurological: He is alert and oriented to person, place, and time.  Skin: Skin is warm and dry.  Psychiatric: He has a normal mood and affect.    ASSESSMENT:    1. Chronic combined systolic and diastolic CHF (congestive heart failure) (HCC)   2. Nonischemic dilated cardiomyopathy (HCC)   3. Cirrhosis of liver with ascites, unspecified hepatic cirrhosis type (HCC)   4. Syncope, unspecified syncope type   5. Typical atrial flutter (HCC)   6. Hypertensive heart disease with heart failure (HCC)   7. Coronary artery disease involving native coronary artery of native heart without angina pectoris    PLAN:    In order of problems listed above:  1. Acute on chronic combined systolic and diastolic CHF - NYHA 3.  His weight is up since last seen. He tells me his weights at home are only up about 4-5 pounds. He seems to have more right-sided heart failure symptoms and left-sided. I suspect he also has ascites in the setting of cirrhosis. He likely has an element of hepatic congestion from congestive heart failure in the setting of cirrhosis.    -  Continue bisoprolol, losartan  -  Increase Lasix to 120 mg every morning/80 mg every afternoon 3 days  -  After 3 days, reduce Lasix to 80 mg twice a day  -  Add spironolactone 25 mg daily  -  Check BMET today and repeat BMET every week 2  -  Close follow-up in one week  -  Consider Admission to the hospital if he has no improvement in his volume at FU.  2. NICM - EF 30-35%.  Continue beta-blocker, angiotensin receptor blocker.  Add Spironolactone as noted.  Consider  echo ~ 3 months.  Refer to EP if  EF < 35.  3. Cirrhosis - He has RUQ pain that has been persistent for 2 mos.  Abd [place] in 1/18 was c/w cirrhosis.  His LFTs have remained elevated.  He has a hx of ETOH abuse. He denies any ETOH use since 04/2016.  However, he had an elevated ETOH level during his recent visit to the ED.  As noted, I will add Spironolactone.  I will also refer him to GI.  He should be seen sooner rather than later for more definitive treatment of his liver issues.   4. Syncope - He tells me that he had no warning when he passed out prior to going to the ED. As noted, he did have ETOH in his system.  I am not certain if his presentation was all related to ETOH.  He does have a DCM.  I will obtain a 48 hr Holter to r/o NSVT.  He does not drive.   5. Atrial flutter - He is now back in NSR.  Continue Bisoprolol, coumadin.  6. HTN - BP uncontrolled.  Add Spironolactone as noted.  Consider increasing dose of ARB at FU if needed.   7. CAD - Mild CAD at prior cath. He notes atypical chest pain.  At this point, I do not think he needs an ischemic evaluation.  He is not on ASA as he is on Coumadin.  I will not start statin Rx given his liver issues.    Medication Adjustments/Labs and Tests Ordered: Current medicines are reviewed at length with the patient today.  Concerns regarding medicines are outlined above.  Medication changes, Labs and Tests ordered today are outlined in the Patient Instructions noted below. Patient Instructions  Medication Instructions:  A.  INCREASE LASIX TO 3 TABLETS IN THE MORNING = 120 MG ; THEN TAKE 2 TABLETS IN PM = 80 MG ; DO THIS FOR 3 DAYS, B.  AFTER 3 DAYS YOU WILL START ON LASIX 80 MG TWICE DAILY (THIS WILL BE 2 TABS IN THE MORNING AND 2 TABS IN THE PM C. START SPIRONOLACTONE 25 MG ONCE A DAY  Labwork: 1. TODAY BMET, PRO BNP 2. 08/08/16 YOU WILL NEED REPEAT BMET 3. 08/15/16 YOU WILL NEED REPEAT BMET  Testing/Procedures: 1. Your physician has recommended that you wear a 48 HOUR  holter monitor. Holter monitors are medical devices that record the heart's electrical activity. Doctors most often use these monitors to diagnose arrhythmias. Arrhythmias are problems with the speed or rhythm of the heartbeat. The monitor is a small, portable device. You can wear one while you do your normal daily activities. This is usually used to diagnose what is causing palpitations/syncope (passing out).  Follow-Up: Edan Juday, PAC 1 WEEK IF POSSIBLE SAME DAY DR. Tenny Craw IS IN THE OFFICE YOU ARE BEING REFERRED TO [facility]   Any Other Special Instructions Will Be Listed Below (If Applicable).  If you need a refill on your cardiac medications before your next appointment, please call your pharmacy.   Return in about 1 week (around 08/10/2016) for Close Follow Up, w/ Tereso Newcomer, PA-C.   Signed, Tereso Newcomer, PA-C  08/03/2016 1:41 PM    [facility] [address] [place], [place], [place]  [address] Phone: [phone]; Fax: [phone]

## 2016-08-03 NOTE — Patient Instructions (Addendum)
Medication Instructions:  A.  INCREASE LASIX TO 3 TABLETS IN THE MORNING = 120 MG ; THEN TAKE 2 TABLETS IN PM = 80 MG ; DO THIS FOR 3 DAYS, B.  AFTER 3 DAYS YOU WILL START ON LASIX 80 MG TWICE DAILY (THIS WILL BE 2 TABS IN THE MORNING AND 2 TABS IN THE PM C. START SPIRONOLACTONE 25 MG ONCE A DAY  Labwork: 1. TODAY BMET, PRO BNP 2. 08/08/16 YOU WILL NEED REPEAT BMET 3. 08/15/16 YOU WILL NEED REPEAT BMET  Testing/Procedures: 1. Your physician has recommended that you wear a 48 HOUR holter monitor. Holter monitors are medical devices that record the heart's electrical activity. Doctors most often use these monitors to diagnose arrhythmias. Arrhythmias are problems with the speed or rhythm of the heartbeat. The monitor is a small, portable device. You can wear one while you do your normal daily activities. This is usually used to diagnose what is causing palpitations/syncope (passing out).  Follow-Up: SCOTT WEAVER, PAC 1 WEEK IF POSSIBLE SAME DAY DR. Tenny Craw IS IN THE OFFICE YOU ARE BEING REFERRED TO [facility]   Any Other Special Instructions Will Be Listed Below (If Applicable).  If you need a refill on your cardiac medications before your next appointment, please call your pharmacy.

## 2016-08-03 NOTE — Telephone Encounter (Signed)
Pt notified of lab results and findings by phone with verbal understanding. Pt aware to disregard lasix directions given at ov today with PA and to only take Lasix 40 mg BID. He is aware to still to take the K+ 10 meq once a day and no more than that, as well as to still start Spironolactone 25 mg daily as instructed today. BMET 08/06/16. Pt advised if he feels worse, or sob or swelling worse then he is to go to the ED. Pt agreeable to plan of care with verbal read back x 2 from pt about new instructions on medications. Pt thanked me for my time and the call.

## 2016-08-04 LAB — PRO B NATRIURETIC PEPTIDE: NT-Pro BNP: 2541 pg/mL — ABNORMAL HIGH (ref 0–210)

## 2016-08-06 ENCOUNTER — Telehealth: Payer: Self-pay | Admitting: *Deleted

## 2016-08-06 ENCOUNTER — Other Ambulatory Visit: Payer: Self-pay | Admitting: *Deleted

## 2016-08-06 DIAGNOSIS — I10 Essential (primary) hypertension: Secondary | ICD-10-CM

## 2016-08-06 DIAGNOSIS — I5042 Chronic combined systolic (congestive) and diastolic (congestive) heart failure: Secondary | ICD-10-CM

## 2016-08-06 MED FILL — ?FUROSEMIDE 40 MG TABLET: 40 | 30 days supply | Qty: 60 | Fill #0

## 2016-08-06 NOTE — Telephone Encounter (Signed)
Pt notified of lab results and findings by phone. Pt states his weight is still the same and his breathing and edema are still the same and has not worsened. Pt does state though that this weekend his hands started shaking. I advised pt to continue the lasix 40 mg bid as stated Friday. Pt said he forgot about getting lab work today. Pt will call someone to see if he can get here today for bmet. I asked pt if for some reason he cannot get lab work today, would he be able to please come in tomorrow, pt said yes but he will try for today. I advised pt at this point that we will also recheck bmet on 3/1. Pt agreeable to plan of care and verbalized instructions.

## 2016-08-07 ENCOUNTER — Telehealth: Payer: Self-pay | Admitting: *Deleted

## 2016-08-07 LAB — BASIC METABOLIC PANEL
BUN/Creatinine Ratio: 11 (ref 10–24)
BUN: 10 mg/dL (ref 8–27)
CO2: 41 mmol/L (ref 18–29)
Calcium: 9.1 mg/dL (ref 8.6–10.2)
Chloride: 93 mmol/L — ABNORMAL LOW (ref 96–106)
Creatinine, Ser: 0.91 mg/dL (ref 0.76–1.27)
GFR calc Af Amer: 104 mL/min/{1.73_m2} (ref >59)
GFR calc non Af Amer: 90 mL/min/{1.73_m2} (ref >59)
Glucose: 116 mg/dL — ABNORMAL HIGH (ref 65–99)
Potassium: 4.3 mmol/L (ref 3.5–5.2)
Sodium: 149 mmol/L — ABNORMAL HIGH (ref 134–144)

## 2016-08-07 NOTE — Telephone Encounter (Signed)
DPR ok to Lmom. Lab work stable. Continue on current medications as you are doing right now. Keep appt 3/1 for repeat bmet. Call if you have any questions [phone].

## 2016-08-08 ENCOUNTER — Other Ambulatory Visit (INDEPENDENT_AMBULATORY_CARE_PROVIDER_SITE_OTHER): Payer: Self-pay

## 2016-08-08 ENCOUNTER — Other Ambulatory Visit: Payer: Self-pay

## 2016-08-08 ENCOUNTER — Ambulatory Visit: Payer: Medicaid Other | Admitting: Family Medicine

## 2016-08-08 ENCOUNTER — Encounter: Payer: Self-pay | Admitting: Gastroenterology

## 2016-08-08 ENCOUNTER — Ambulatory Visit (INDEPENDENT_AMBULATORY_CARE_PROVIDER_SITE_OTHER): Payer: Self-pay | Admitting: Gastroenterology

## 2016-08-08 VITALS — BP 114/70 | HR 76 | Ht 73.25 in | Wt 201.1 lb

## 2016-08-08 DIAGNOSIS — R1033 Periumbilical pain: Secondary | ICD-10-CM

## 2016-08-08 DIAGNOSIS — R7989 Other specified abnormal findings of blood chemistry: Secondary | ICD-10-CM

## 2016-08-08 DIAGNOSIS — R945 Abnormal results of liver function studies: Principal | ICD-10-CM

## 2016-08-08 LAB — IBC PANEL
Iron: 75 ug/dL (ref 42–165)
Saturation Ratios: 18.6 % — ABNORMAL LOW (ref 20.0–50.0)
Transferrin: 288 mg/dL (ref 212.0–360.0)

## 2016-08-08 LAB — FERRITIN: Ferritin: 94.5 ng/mL (ref 22.0–322.0)

## 2016-08-08 MED ORDER — HYOSCYAMINE SULFATE 0.125 MG SL SUBL: 0.1250 mg | SUBLINGUAL_TABLET | Freq: Three times a day (TID) | SUBLINGUAL | 1 refills | Status: DC | PRN

## 2016-08-08 MED FILL — HYOSCYAMINE 0.125 MG TAB SL: 0.125 | 10 days supply | Qty: 30 | Fill #0

## 2016-08-08 NOTE — Progress Notes (Addendum)
08/08/2016 Jared [name] [id number] 03-Oct-1955   HISTORY OF PRESENT ILLNESS:  This is a 61 year old male who is new to our practice and has been referred here by cardiology, Tereso Newcomer, PA-C, for evaluation regarding elevated LFT's.  Patient has combined systolic and diastolic CHF with recent EF of 30-35%, atrial flutter on coumadin, CAD, history of CVA, COPD.  During recent lab evaluation two weeks ago he was found to have ALP 137, AST 92, ALT 100, and normal total bili.  Repeat labs 5 days later showed ALP 152, AST 53, ALT 72, and normal total bili.  One month ago LFT's were normal except for mildly elevated ALP at 130.  Ultrasound in January showed the following:   IMPRESSION: Coarsened echotexture of the liver with slight surface nodularity suggesting cirrhosis. There may be mild sympathetic thickening from of the gallbladder given trace ascites noted in the right upper quadrant. Some of this may also be due to underdistention. No cholelithiasis. Chronic cholecystitis is not entirely excluded.  He had an ultrasound in November 2017 and July 2017, which both indicated normal liver echotexture, however.  He denies ETOH use, but recent alcohol level elevated at 70.  He was placed on oxygen in January and says that his breathing is still "not great".  His only GI complaint is of some mid-abdominal pain that comes and goes.  Says that when it comes sometimes it is and 8/10 on the pain scale.  No other associated symptoms.  No recent GI history.   Past Medical History:  Diagnosis Date  . CAD (coronary artery disease)    a. LHC 5/12:  LAD 20, pLCx 20, pRCA 40, dRCA 40, EF 35%, diff HK  //  b. Myoview 4/16: Overall Impression: High risk stress nuclear study There is no evidence of ischemia. There is severe LV dysfunction. LV Ejection Fraction: 30%. LV Wall Motion: There is global LV hypokinesis.   Marland Kitchen CAP (community acquired pneumonia) 09/2013  . Chronic combined systolic and  diastolic CHF (congestive heart failure) (HCC)    a. Echo 4/16:Mild LVH, EF 40-45%, diffuse HK //  b. Echo 8/17: EF 35-40%, diffuse HK, diastolic dysfunction, aortic sclerosis, trivial MR, moderate LAE, normal RVSF, moderate RAE, mild TR, PASP 42 mmHg  . Cluster headache    "hx; haven't had one in awhile" (01/09/2016)  . COPD (chronic obstructive pulmonary disease) (HCC)    [name] 01/09/2016  . History of CVA (cerebrovascular accident)   . Hypertension   . NICM (nonischemic cardiomyopathy) (HCC)   . Tobacco abuse    Past Surgical History:  Procedure Laterality Date  . CARDIAC CATHETERIZATION  10/2010   LM normal, LAD with 20% irregularities, LCX with 20%, RCA with 40% prox and 40% distal - EF of 35%  . COLONOSCOPY W/ BIOPSIES AND POLYPECTOMY    . EXCISION MASS HEAD    . VIDEO BRONCHOSCOPY Bilateral 05/08/2016   Procedure: VIDEO BRONCHOSCOPY WITH FLUORO;  Surgeon: Oretha Milch, MD;  Location: [facility] ENDOSCOPY;  Service: Cardiopulmonary;  Laterality: Bilateral;    reports that he has been smoking Cigarettes.  He has a 10.50 pack-year smoking history. He has never used smokeless tobacco. He reports that he drinks alcohol. He reports that he does not use drugs. family history includes Cancer in his father; Colon cancer in his mother; Diabetes in his brother, mother, and sister; Heart disease in his mother; Liver cancer in his mother. Allergies  Allergen Reactions  . Lisinopril Cough  Outpatient Encounter Prescriptions as of 08/08/2016  Medication Sig  . albuterol (PROVENTIL HFA;VENTOLIN HFA) 108 (90 Base) MCG/ACT inhaler Inhale 2 puffs into the lungs every 6 (six) hours as needed for wheezing or shortness of breath.  . bisoprolol (ZEBETA) 5 MG tablet Take 1 tablet (5 mg total) by mouth daily.  . furosemide (LASIX) 40 MG tablet Take 1 tablet (40 mg total) by mouth 2 (two) times daily.  . Glycopyrrolate-Formoterol (BEVESPI AEROSPHERE) 9-4.8 MCG/ACT AERO Inhale 2 puffs into the lungs 2 (two)  times daily.  Marland Kitchen losartan (COZAAR) 25 MG tablet Take half tablet by mouth daily.  . OXYGEN 2lpm 24/7  . potassium chloride SA (K-DUR,KLOR-CON) 10 MEQ tablet Take 1 tablet (10 mEq total) by mouth daily.  Marland Kitchen spironolactone (ALDACTONE) 25 MG tablet Take 1 tablet (25 mg total) by mouth daily.  Marland Kitchen warfarin (COUMADIN) 7.5 MG tablet Take as directed by coumadin clinic   No facility-administered encounter medications on file as of 08/08/2016.      REVIEW OF SYSTEMS  : All other systems reviewed and negative except where noted in the History of Present Illness.   PHYSICAL EXAM: BP 114/70 (BP Location: Left Arm, Patient Position: Sitting, Cuff Size: Normal)   Pulse 76 Comment: irregular  Ht 6' 1.25" (1.861 m) Comment: height measured without shoes  Wt 201 lb 2 oz (91.2 kg)   BMI 26.35 kg/m  General: Well developed black male in no acute distress; on oxygen Head: Normocephalic and atraumatic Eyes:  Sclerae anicteric, conjunctiva pink. Ears: Normal auditory acuity Lungs: Clear throughout to auscultation Heart: Regular rate and rhythm Abdomen: Soft, non-distended. Normal bowel sounds.  Non-tender. Musculoskeletal: Symmetrical with no gross deformities  Skin: No lesions on visible extremities Extremities: No edema  Neurological: Alert oriented x 4, grossly non-focal Psychological:  Alert and cooperative. Normal mood and affect  ASSESSMENT AND PLAN: -61 year old male with recently elevated LFT's and ultrasound suggesting cirrhosis.  Two other ultrasounds over the past 6-7 months did not show any liver abnormalities.  I am curious if his elevated LFT's and the recent ultrasound findings are secondary to hepatic congestion from his recent issues with CHF.  Will check and ultrasound with elastography as well as some initial liver serologies for viral hepatitis and iron studies.  He denies ETOH use but recent ETOH level elevated. -Combined systolic and diastolic CHFwith recent EF of 16-10% at recent  hospitalization and recently placed on O2 -Atrial flutter on anticoagulation with coumadin -CAD -Non-ischemic cardiomyopathy -COPD -Mid-abdominal pain:  Intermittent.  Will try some levsin prn for now.  If continues then may need to consider CT scan.  *He will follow-up here in approximately 4 weeks.   CC:  Jaclyn Shaggy, MD  Thank you for sending this case to me. I have reviewed the entire note, and the outlined plan seems appropriate.  Recent echo also shows right sided heart failure.  Therefore, this appears to be a combination of passive hepatic congestion ("cardiac cirrhosis") and alcohol use. I agree with checking hepatitis serologies.  Iron studies may be elevated due to EtOH use. When he follows up in clinic next, please get an AFP just for a baseline.  No liver mass was seen on recent ultrasound.  I think you can cancel the elastography because we already suspect he has cirrhosis and the results will not change our management.  And I do not think he needs a screening EGD for varices because he is already on a beta blocker.  Sherilyn Cooter  Myrtie Neither, MD

## 2016-08-08 NOTE — Patient Instructions (Addendum)
You have been scheduled for an abdominal ultrasound at [facility] (1st floor of hospital) on 08-16-16 at 11 am. Please arrive 15 minutes prior to your appointment for registration. Make certain not to have anything to eat or drink 6 hours prior to your appointment. Should you need to reschedule your appointment, please contact radiology at [phone]. This test typically takes about 30 minutes to perform.  Your physician has requested that you go to the basement for lab work before leaving today.  We have sent the following medications to your pharmacy for you to pick up at your convenience: levsin 0.125 mg every 8 hrs as needed

## 2016-08-09 ENCOUNTER — Ambulatory Visit (INDEPENDENT_AMBULATORY_CARE_PROVIDER_SITE_OTHER): Payer: Self-pay

## 2016-08-09 ENCOUNTER — Other Ambulatory Visit: Payer: Self-pay | Admitting: *Deleted

## 2016-08-09 DIAGNOSIS — I483 Typical atrial flutter: Secondary | ICD-10-CM

## 2016-08-09 DIAGNOSIS — I5042 Chronic combined systolic (congestive) and diastolic (congestive) heart failure: Secondary | ICD-10-CM

## 2016-08-09 LAB — HEPATITIS C ANTIBODY: HCV Ab: NEGATIVE

## 2016-08-09 LAB — HEPATITIS B SURFACE ANTIGEN: Hepatitis B Surface Ag: NEGATIVE

## 2016-08-09 LAB — HEPATITIS B SURFACE ANTIBODY,QUALITATIVE: Hep B S Ab: NEGATIVE

## 2016-08-09 LAB — HEPATITIS A ANTIBODY, TOTAL: Hep A Total Ab: REACTIVE — AB

## 2016-08-09 NOTE — Progress Notes (Signed)
Cardiology Office Note:    Date:  08/10/2016   ID:  Jared [name], DOB [birth year], MRN [id number]  PCP:  Jaclyn Shaggy, MD  Cardiologist:Dr. Dietrich Pates Electrophysiologist: n/a Pulmonology: Dr. [name]  Referring MD: Jaclyn Shaggy, MD   Chief Complaint  Patient presents with  . Follow-up    CHF    History of Present Illness:    Jared [name] is a 61 y.o. male with a hx of combined systolic and diastolic CHF, nonischemic cardiomyopathy with EF ~35%, mild nonobstructive CAD at cardiac catheterization in 2012, prior CVA, HTN, tobacco abuse, COPD.   He was admitted in 1/18 with a/c combined systolic and diastolic HF in the setting of AFlutter.  EF was 30-35 on echo.  He had cirrhotic changes on abdominal US.  He was placed on Coumadin Rx. He FU 07/13/16 with me and was still in AFlutter.  I adjusted his beta-blocker and recommended close follow up with an eye towards DCCV after 3-4 weeks of therapeutic anticoagulation.    He was seen in the ED 07/25/16 with ?syncope/near syncope.  ECG demonstrated NSR.  ETOH level was high (70).   BNP was high at 549.   Troponin was neg x 1. Of note, LFTs have remained high.  He FU with his PCP earlier this week and he was volume overloaded.  His Lasix was adjusted.  Of note, his BNP was > 1400.    CHADS2-VASc=5 (CHF, CVA, HTN, vascular dz).   Last seen 08/03/16.  He appeared to be volume overloaded and I adjusted his diuretics. However, his labs returned suggesting contracture alkalosis.  I reduced his diuretic dose again.  He had a recent trip to the ED for syncope and I set him up for a Holter monitor.  I also referred him to GI for cirrhosis.  He returns today for close follow up.  He is here alone. His breathing is somewhat improved. He still notes orthopnea. He denies PND. LE edema is improved. He denies chest pain. He denies syncope. He does note some difficulty with balance after standing. He denies significant abdominal pain. He denies any bleeding  issues.  Prior CV studies:   The following studies were reviewed today:  Echo 07/04/16  Moderate LVH, EF 30-35, diffuse HK aortic sclerosis, trivial MR, moderate LAE, mildly reduced RVSF, moderate RAE, mild TR, PASP 35, small pericardial effusion  Echo 01/10/16 EF 35-40%, diffuse HK, diastolic dysfunction, aortic sclerosis, trivial MR, moderate LAE, normal RVSF, moderate RAE, mild TR, PASP 42 mmHg  Myoview 4/16 Overall Impression: High risk stress nuclear study There is no evidence of ischemia. There is severe LV dysfunction. LV Ejection Fraction: 30%. LV Wall Motion: There is global LV hypokinesis.   Echo 4/16 Mild LVH, EF 40-45%, diffuse HK  Cardiac cath(10/11/2010) Done [facility]) Known cardiomyopathy prior. LM normal  LAD 20% irregularities LCX 20% proximal RCA: Dominant; 40% proximal, 40% distal LVEF 35% with diffuse hypokinesis.  Past Medical History:  Diagnosis Date  . CAD (coronary artery disease)    a. LHC 5/12:  LAD 20, pLCx 20, pRCA 40, dRCA 40, EF 35%, diff HK  //  b. Myoview 4/16: Overall Impression: High risk stress nuclear study There is no evidence of ischemia. There is severe LV dysfunction. LV Ejection Fraction: 30%. LV Wall Motion: There is global LV hypokinesis.   Marland Kitchen CAP (community acquired pneumonia) 09/2013  . Chronic combined systolic and diastolic CHF (congestive heart failure) (HCC)    a. Echo 4/16:Mild  LVH, EF 40-45%, diffuse HK //  b. Echo 8/17: EF 35-40%, diffuse HK, diastolic dysfunction, aortic sclerosis, trivial MR, moderate LAE, normal RVSF, moderate RAE, mild TR, PASP 42 mmHg  . Cluster headache    "hx; haven't had one in awhile" (01/09/2016)  . COPD (chronic obstructive pulmonary disease) (HCC)    [name] 01/09/2016  . History of CVA (cerebrovascular accident)   . Hypertension   . NICM (nonischemic cardiomyopathy) (HCC)   . Tobacco abuse     Past Surgical History:  Procedure Laterality Date  .  CARDIAC CATHETERIZATION  10/2010   LM normal, LAD with 20% irregularities, LCX with 20%, RCA with 40% prox and 40% distal - EF of 35%  . COLONOSCOPY W/ BIOPSIES AND POLYPECTOMY    . EXCISION MASS HEAD    . VIDEO BRONCHOSCOPY Bilateral 05/08/2016   Procedure: VIDEO BRONCHOSCOPY WITH FLUORO;  Surgeon: Oretha Milch, MD;  Location: [facility] ENDOSCOPY;  Service: Cardiopulmonary;  Laterality: Bilateral;    Current Medications: Current Meds  Medication Sig  . albuterol (PROVENTIL HFA;VENTOLIN HFA) 108 (90 Base) MCG/ACT inhaler Inhale 2 puffs into the lungs every 6 (six) hours as needed for wheezing or shortness of breath.  . bisoprolol (ZEBETA) 5 MG tablet Take 1 tablet (5 mg total) by mouth daily.  . Glycopyrrolate-Formoterol (BEVESPI AEROSPHERE) 9-4.8 MCG/ACT AERO Inhale 2 puffs into the lungs 2 (two) times daily.  . hyoscyamine (LEVSIN SL) 0.125 MG SL tablet Place 1 tablet (0.125 mg total) under the tongue every 8 (eight) hours as needed.  . OXYGEN 2lpm 24/7  . spironolactone (ALDACTONE) 25 MG tablet Take 1 tablet (25 mg total) by mouth daily.  Marland Kitchen warfarin (COUMADIN) 7.5 MG tablet Take as directed by coumadin clinic  . [DISCONTINUED] furosemide (LASIX) 40 MG tablet Take 1 tablet (40 mg total) by mouth 2 (two) times daily.  . [DISCONTINUED] losartan (COZAAR) 25 MG tablet Take half tablet by mouth daily.  . [DISCONTINUED] potassium chloride SA (K-DUR,KLOR-CON) 10 MEQ tablet Take 1 tablet (10 mEq total) by mouth daily.     Allergies:   Lisinopril   Social History   Social History  . Marital status: Single    Spouse name: N/A  . Number of children: 1  . Years of education: N/A   Occupational History  . retired    Social History Main Topics  . Smoking status: Current Every Day Smoker    Packs/day: 0.25    Years: 42.00    Types: Cigarettes  . Smokeless tobacco: Never Used     Comment: 5 cigs daily  . Alcohol use 0.0 oz/week     Comment: drinks 1x per month on pay day  . Drug use: No      Comment: "nothing in 20 years"  . Sexual activity: Not Currently   Other Topics Concern  . None   Social History Narrative   unemployed        Family History  Problem Relation Age of Onset  . Heart disease Mother   . Diabetes Mother   . Colon cancer Mother   . Liver cancer Mother   . Cancer Father     type unknown  . Diabetes Sister     x 2  . Diabetes Brother      ROS:   Please see the history of present illness.    Review of Systems  Constitution: Positive for decreased appetite and malaise/fatigue.  Cardiovascular: Positive for chest pain and irregular heartbeat.  Respiratory: Positive for shortness  of breath.   Gastrointestinal: Positive for abdominal pain.  Neurological: Positive for loss of balance.   All other systems reviewed and are negative.   EKGs/Labs/Other Test Reviewed:    EKG:  EKG is not ordered today.  The ekg ordered today demonstrates n/a  Recent Labs: 07/03/2016: Magnesium 1.7 07/04/2016: TSH 1.268 07/25/2016: Hemoglobin 13.7; Platelets 144 07/30/2016: ALT 72; Brain Natriuretic Peptide 1,458.6 08/03/2016: NT-Pro BNP 2,541 08/09/2016: BUN 11; Creatinine, Ser 1.05; Potassium 4.1; Sodium 150   Recent Lipid Panel    Component Value Date/Time   CHOL 147 01/11/2016 0446   TRIG 69 01/11/2016 0446   HDL 53 01/11/2016 0446   CHOLHDL 2.8 01/11/2016 0446   VLDL 14 01/11/2016 0446   LDLCALC 80 01/11/2016 0446     Physical Exam:    VS:  BP 116/70 (BP Location: Left Arm, Patient Position: Sitting, Cuff Size: Normal)   Pulse 72   Ht 6' 1.5" (1.867 m)   Wt 199 lb (90.3 kg)   BMI 25.90 kg/m     Wt Readings from Last 3 Encounters:  08/10/16 199 lb (90.3 kg)  08/08/16 201 lb 2 oz (91.2 kg)  08/03/16 216 lb (98 kg)     Physical Exam  Constitutional: He is oriented to person, place, and time. He appears well-developed and well-nourished. No distress.  HENT:  Head: Normocephalic and atraumatic.  Eyes: No scleral icterus.  Neck: No JVD present.    Cardiovascular: Normal rate, regular rhythm and normal heart sounds.   No murmur heard. Pulmonary/Chest: He has decreased breath sounds. He has no rales.  Abdominal: Soft. There is no hepatomegaly. There is no tenderness.  Musculoskeletal: He exhibits no edema.  Neurological: He is alert and oriented to person, place, and time.  Skin: Skin is warm and dry.  Psychiatric: He has a normal mood and affect.    ASSESSMENT:    1. Chronic combined systolic and diastolic CHF (congestive heart failure) (HCC)   2. Nonischemic dilated cardiomyopathy (HCC)   3. Cirrhosis of liver with ascites, unspecified hepatic cirrhosis type (HCC)   4. Syncope, unspecified syncope type   5. Typical atrial flutter (HCC)   6. Essential hypertension   7. Coronary artery disease involving native coronary artery of native heart without angina pectoris    PLAN:    In order of problems listed above:  1. Chronic combined systolic and diastolic CHF - NYHA 2b.  His volume is improved since last visit.  Will continue Bisoprolol, Losartan, Spironolactone.  As his labs have been concerning for contracture alkalosis (CO2 41), I will reduce his Furosemide.  -  Decrease Lasix to 60 mg QD.  He will call if his weight increases.    -  Check BMET 1 week.    -  Consider changing Losartan to Entresto at FU.   2. NICM - EF 30-35%.  Continue beta-blocker, angiotensin receptor blocker, Spironolactone.    -  Arrange FU Echo in 09/2016.  If EF < 35, refer to EP.  3. Cirrhosis - He saw GI earlier this week.  Abd [place] is pending.  Continue FU with GI.  Continue Spironolactone. His abdominal exam is improved. Question if hepatic congestion has contributed to some of his elevated LFTs.   4. Syncope - No recurrence.  Holter monitor is pending.   5. Atrial flutter - He has returned to NSR.  Continue coumadin. No bleeding has been noted. Of note, his Plt counts have been normal/low normal.   6. HTN -  BP is improved.    7. CAD - Mild CAD  at prior cath.  He denies angina.  Will avoid statin now with his liver issues.  He is not on ASA as he is on Coumadin.    Medication Adjustments/Labs and Tests Ordered: Current medicines are reviewed at length with the patient today.  Concerns regarding medicines are outlined above.  Medication changes, Labs and Tests ordered today are outlined in the Patient Instructions noted below. Patient Instructions  Medication Instructions:  1. STOP Potassium (Potassium Chloride) 2. DECREASE Lasix (Fursoemide) to 60 mg Once daily (you will take 1 and 1/2 tablets of the 40 mg tablet).  Labwork: In 1 week - BMET  Testing/Procedures: AFTER October 02, 2016, schedule an echocardiogram. Your physician has requested that you have an echocardiogram. Echocardiography is a painless test that uses sound waves to create images of your heart. It provides your doctor with information about the size and shape of your heart and how well your heart's chambers and valves are working. This procedure takes approximately one hour. There are no restrictions for this procedure.  Follow-Up: 1. Vonte Rossin, PAC in 3 weeks. 2. Dr. Dietrich Pates in late April 2018 or early May 2018 (appointment should be after echocardiogram is done).  Any Other Special Instructions Will Be Listed Below (If Applicable). Weight daily.  Call Tereso Newcomer, PA-C if weight increases by more than 2 lbs in 1 day. [phone]  If you need a refill on your cardiac medications before your next appointment, please call your pharmacy.    Return in about 3 weeks (around 08/31/2016) for Close Follow Up, w/ Tereso Newcomer, PA-C.   Signed, Tereso Newcomer, PA-C  08/10/2016 11:08 AM    [facility] [address] [place], [place], [place]  [address] Phone: [phone]; Fax: [phone]

## 2016-08-10 ENCOUNTER — Encounter: Payer: Self-pay | Admitting: Physician Assistant

## 2016-08-10 ENCOUNTER — Telehealth: Payer: Self-pay | Admitting: *Deleted

## 2016-08-10 ENCOUNTER — Ambulatory Visit (INDEPENDENT_AMBULATORY_CARE_PROVIDER_SITE_OTHER): Payer: Self-pay | Admitting: Physician Assistant

## 2016-08-10 VITALS — BP 116/70 | HR 72 | Ht 73.5 in | Wt 199.0 lb

## 2016-08-10 DIAGNOSIS — I2510 Atherosclerotic heart disease of native coronary artery without angina pectoris: Secondary | ICD-10-CM

## 2016-08-10 DIAGNOSIS — R188 Other ascites: Secondary | ICD-10-CM

## 2016-08-10 DIAGNOSIS — I483 Typical atrial flutter: Secondary | ICD-10-CM

## 2016-08-10 DIAGNOSIS — I10 Essential (primary) hypertension: Secondary | ICD-10-CM

## 2016-08-10 DIAGNOSIS — I420 Dilated cardiomyopathy: Secondary | ICD-10-CM

## 2016-08-10 DIAGNOSIS — R55 Syncope and collapse: Secondary | ICD-10-CM

## 2016-08-10 DIAGNOSIS — I5042 Chronic combined systolic (congestive) and diastolic (congestive) heart failure: Secondary | ICD-10-CM

## 2016-08-10 DIAGNOSIS — K7460 Unspecified cirrhosis of liver: Secondary | ICD-10-CM

## 2016-08-10 DIAGNOSIS — I429 Cardiomyopathy, unspecified: Secondary | ICD-10-CM

## 2016-08-10 LAB — BASIC METABOLIC PANEL
BUN/Creatinine Ratio: 10 (ref 10–24)
BUN: 11 mg/dL (ref 8–27)
CO2: 41 mmol/L (ref 18–29)
Calcium: 9.5 mg/dL (ref 8.6–10.2)
Chloride: 95 mmol/L — ABNORMAL LOW (ref 96–106)
Creatinine, Ser: 1.05 mg/dL (ref 0.76–1.27)
GFR calc Af Amer: 88 mL/min/{1.73_m2} (ref >59)
GFR calc non Af Amer: 76 mL/min/{1.73_m2} (ref >59)
Glucose: 90 mg/dL (ref 65–99)
Potassium: 4.1 mmol/L (ref 3.5–5.2)
Sodium: 150 mmol/L — ABNORMAL HIGH (ref 134–144)

## 2016-08-10 MED ORDER — FUROSEMIDE 40 MG PO TABS: 60.0000 mg | ORAL_TABLET | Freq: Every day | ORAL | 3 refills | Status: DC

## 2016-08-10 MED ORDER — LOSARTAN POTASSIUM 25 MG PO TABS: 25.0000 mg | ORAL_TABLET | Freq: Every day | ORAL | 3 refills | Status: DC

## 2016-08-10 MED FILL — LOSARTAN POTASSIUM 25 MG TA: 25 | 30 days supply | Qty: 30 | Fill #0

## 2016-08-10 MED FILL — FUROSEMIDE 40 MG TABLET: 40 | 30 days supply | Qty: 45 | Fill #0

## 2016-08-10 NOTE — Telephone Encounter (Signed)
Pt notified of lab results by phone with verbal understanding. Pt advised to hold lasix x 1 day; then resume lasix 60 mg daily. bmet 3/7 when he comes in for his CVRR appt as well. Pt gave verbal read back to instructions x 1 with understanding.

## 2016-08-10 NOTE — Patient Instructions (Addendum)
Medication Instructions:  1. STOP Potassium (Potassium Chloride) 2. DECREASE Lasix (Fursoemide) to 60 mg Once daily (you will take 1 and 1/2 tablets of the 40 mg tablet).  Labwork: In 1 week - BMET  Testing/Procedures: AFTER October 02, 2016, schedule an echocardiogram. Your physician has requested that you have an echocardiogram. Echocardiography is a painless test that uses sound waves to create images of your heart. It provides your doctor with information about the size and shape of your heart and how well your heart's chambers and valves are working. This procedure takes approximately one hour. There are no restrictions for this procedure.  Follow-Up: 1. Zanai Mallari, PAC in 3 weeks. 2. Dr. Dietrich Pates in late April 2018 or early May 2018 (appointment should be after echocardiogram is done).  Any Other Special Instructions Will Be Listed Below (If Applicable). Weight daily.  Call Tereso Newcomer, PA-C if weight increases by more than 2 lbs in 1 day. [phone]  If you need a refill on your cardiac medications before your next appointment, please call your pharmacy.

## 2016-08-13 ENCOUNTER — Encounter: Payer: Self-pay | Admitting: Gastroenterology

## 2016-08-13 DIAGNOSIS — R945 Abnormal results of liver function studies: Principal | ICD-10-CM

## 2016-08-13 DIAGNOSIS — R1033 Periumbilical pain: Secondary | ICD-10-CM | POA: Insufficient documentation

## 2016-08-13 DIAGNOSIS — R7989 Other specified abnormal findings of blood chemistry: Secondary | ICD-10-CM | POA: Insufficient documentation

## 2016-08-15 ENCOUNTER — Other Ambulatory Visit: Payer: Self-pay | Admitting: *Deleted

## 2016-08-15 ENCOUNTER — Ambulatory Visit (INDEPENDENT_AMBULATORY_CARE_PROVIDER_SITE_OTHER): Payer: Self-pay | Admitting: *Deleted

## 2016-08-15 DIAGNOSIS — I483 Typical atrial flutter: Secondary | ICD-10-CM

## 2016-08-15 DIAGNOSIS — I5042 Chronic combined systolic (congestive) and diastolic (congestive) heart failure: Secondary | ICD-10-CM

## 2016-08-15 DIAGNOSIS — Z5181 Encounter for therapeutic drug level monitoring: Secondary | ICD-10-CM

## 2016-08-15 LAB — BASIC METABOLIC PANEL
BUN/Creatinine Ratio: 15 (ref 10–24)
BUN: 15 mg/dL (ref 8–27)
CO2: 33 mmol/L — ABNORMAL HIGH (ref 18–29)
Calcium: 9.3 mg/dL (ref 8.6–10.2)
Chloride: 95 mmol/L — ABNORMAL LOW (ref 96–106)
Creatinine, Ser: 0.98 mg/dL (ref 0.76–1.27)
GFR calc Af Amer: 95 mL/min/{1.73_m2} (ref >59)
GFR calc non Af Amer: 82 mL/min/{1.73_m2} (ref >59)
Glucose: 67 mg/dL (ref 65–99)
Potassium: 4.4 mmol/L (ref 3.5–5.2)
Sodium: 143 mmol/L (ref 134–144)

## 2016-08-15 LAB — POCT INR: INR: 2.8

## 2016-08-16 ENCOUNTER — Ambulatory Visit (HOSPITAL_COMMUNITY)
Admission: RE | Admit: 2016-08-16 | Discharge: 2016-08-16 | Disposition: A | Discharge status: Home / Self Care | Payer: Self-pay | Source: Ambulatory Visit | Attending: Gastroenterology | Admitting: Gastroenterology

## 2016-08-16 DIAGNOSIS — R945 Abnormal results of liver function studies: Secondary | ICD-10-CM

## 2016-08-16 DIAGNOSIS — R7989 Other specified abnormal findings of blood chemistry: Secondary | ICD-10-CM | POA: Insufficient documentation

## 2016-08-20 ENCOUNTER — Encounter: Payer: Self-pay | Admitting: Family Medicine

## 2016-08-20 ENCOUNTER — Ambulatory Visit: Payer: Self-pay | Attending: Family Medicine | Admitting: Family Medicine

## 2016-08-20 VITALS — BP 121/71 | HR 91 | Temp 98.3°F | Ht 73.0 in | Wt 192.8 lb

## 2016-08-20 DIAGNOSIS — I2510 Atherosclerotic heart disease of native coronary artery without angina pectoris: Secondary | ICD-10-CM | POA: Insufficient documentation

## 2016-08-20 DIAGNOSIS — I5042 Chronic combined systolic (congestive) and diastolic (congestive) heart failure: Secondary | ICD-10-CM | POA: Insufficient documentation

## 2016-08-20 DIAGNOSIS — Z8673 Personal history of transient ischemic attack (TIA), and cerebral infarction without residual deficits: Secondary | ICD-10-CM | POA: Insufficient documentation

## 2016-08-20 DIAGNOSIS — I10 Essential (primary) hypertension: Secondary | ICD-10-CM

## 2016-08-20 DIAGNOSIS — J449 Chronic obstructive pulmonary disease, unspecified: Secondary | ICD-10-CM | POA: Insufficient documentation

## 2016-08-20 DIAGNOSIS — Z888 Allergy status to other drugs, medicaments and biological substances status: Secondary | ICD-10-CM | POA: Insufficient documentation

## 2016-08-20 DIAGNOSIS — Z9889 Other specified postprocedural states: Secondary | ICD-10-CM | POA: Insufficient documentation

## 2016-08-20 DIAGNOSIS — I483 Typical atrial flutter: Secondary | ICD-10-CM | POA: Insufficient documentation

## 2016-08-20 DIAGNOSIS — J9611 Chronic respiratory failure with hypoxia: Secondary | ICD-10-CM | POA: Insufficient documentation

## 2016-08-20 DIAGNOSIS — Z7901 Long term (current) use of anticoagulants: Secondary | ICD-10-CM | POA: Insufficient documentation

## 2016-08-20 DIAGNOSIS — I429 Cardiomyopathy, unspecified: Secondary | ICD-10-CM | POA: Insufficient documentation

## 2016-08-20 DIAGNOSIS — I110 Hypertensive heart disease with heart failure: Secondary | ICD-10-CM | POA: Insufficient documentation

## 2016-08-20 NOTE — Progress Notes (Signed)
Subjective:    Patient ID: Jared [name], male    DOB: May 11, 1956, 61 y.o.   MRN: [id number]  HPI Haedyn [name] is a 61 year old male with a history of hypertension, COPD, tobacco abuse, NICM , CHF (EF 30-35% from 2-D echo 06/2016), atrial flutter (currently on anticoagulation with Coumadin) who comes in today for a follow-up visit.  He recently had abdominal imaging to evaluate possible congestive hepatopathy and abdominal ultrasound with the Elastoraphy revealed moderate risk of fibrosis; he is scheduled to see GI soon. His visit with cardiology he was placed on 80 mg twice daily; he denies pedal edema.  Last visit to pulmonary was on 07/24/16 and he remains on Proventil inhaler, Bevespi. He does have a baseline shortness of breath and remains 2 L of oxygen at rest; continues to smoke.  Past Medical History:  Diagnosis Date  . CAD (coronary artery disease)    a. LHC 5/12:  LAD 20, pLCx 20, pRCA 40, dRCA 40, EF 35%, diff HK  //  b. Myoview 4/16: Overall Impression: High risk stress nuclear study There is no evidence of ischemia. There is severe LV dysfunction. LV Ejection Fraction: 30%. LV Wall Motion: There is global LV hypokinesis.   Marland Kitchen CAP (community acquired pneumonia) 09/2013  . Chronic combined systolic and diastolic CHF (congestive heart failure) (HCC)    a. Echo 4/16:Mild LVH, EF 40-45%, diffuse HK //  b. Echo 8/17: EF 35-40%, diffuse HK, diastolic dysfunction, aortic sclerosis, trivial MR, moderate LAE, normal RVSF, moderate RAE, mild TR, PASP 42 mmHg  . Cluster headache    "hx; haven't had one in awhile" (01/09/2016)  . COPD (chronic obstructive pulmonary disease) (HCC)    [name] 01/09/2016  . History of CVA (cerebrovascular accident)   . Hypertension   . NICM (nonischemic cardiomyopathy) (HCC)   . Tobacco abuse     Past Surgical History:  Procedure Laterality Date  . CARDIAC CATHETERIZATION  10/2010   LM normal, LAD with 20% irregularities, LCX with 20%, RCA with 40% prox  and 40% distal - EF of 35%  . COLONOSCOPY W/ BIOPSIES AND POLYPECTOMY    . EXCISION MASS HEAD    . VIDEO BRONCHOSCOPY Bilateral 05/08/2016   Procedure: VIDEO BRONCHOSCOPY WITH FLUORO;  Surgeon: Oretha Milch, MD;  Location: [facility] ENDOSCOPY;  Service: Cardiopulmonary;  Laterality: Bilateral;    Allergies  Allergen Reactions  . Lisinopril Cough     Review of Systems Constitutional: Negative for activity change and appetite change.  HENT: Negative for sinus pressure and sore throat.   Eyes: Negative for visual disturbance.  Respiratory: Positive for shortness of breath which is at baseline . Negative for cough and chest tightness.   Cardiovascular:Negative for leg swelling. Negative for chest pain.  Gastrointestinal: Negative for abdominal distention, abdominal pain, constipation and diarrhea.  Endocrine: Negative.   Genitourinary: Negative for dysuria.  Musculoskeletal: Negative for joint swelling and myalgias.  Skin: Negative for rash.  Allergic/Immunologic: Negative.   Neurological: Negative for weakness, light-headedness and numbness.  Psychiatric/Behavioral: Negative for dysphoric mood and suicidal ideas.     Objective: Vitals:   08/20/16 0858  BP: 121/71  Pulse: 91  Temp: 98.3 F (36.8 C)  TempSrc: Oral  SpO2: 94%  Weight: 192 lb 12.8 oz (87.5 kg)  Height: 6\' 1"  (1.854 m)      Physical Exam Constitutional: He is oriented to person, place, and time. He appears well-developed and well-nourished.  HENT:  2L Oxygen via nasal canula  Neck: No JVD present.  Cardiovascular: Normal rate, normal heart sounds and intact distal pulses.   No murmur heard. Pulmonary/Chest: Effort normal and breath sounds normal. He has no wheezes. He has no rales. He exhibits no tenderness.  Abdominal: Soft. Bowel sounds are normal. He exhibits no distension and no mass. There is no tenderness.  Musculoskeletal: Normal range of motion. He exhibits no edema Neurological: He is alert and oriented  to person, place, and time.  Psych: normal      Assessment & Plan:  1. Essential hypertension Controlled Continue bisoprolol, low-sodium diet  2. COPD GOLD III/still smoking  Ongoing smoking not helping - advised that discontinuing smoking will retard progression of condition Continue Proventil MDI and Bevespi Appointment with pulmonary 10/2016  3. Chronic respiratory failure with hypoxia (HCC) Continue 2 L oxygen at rest  4. Typical atrial flutter (HCC) Currently on anticoagulation with Coumadin Last INR was 2.8 Has upcoming appointment at the Coumadin clinic ? Plan for DCCV   5. Chronic systolic CHF (congestive heart failure) (HCC) EF of 35-40%,NYHA III Euvolemic Continue Lasix to 60 mg twice daily Stressed the importance of restricting fluid intake to less than 2 L per day, daily weight checks Sees cardiology in 1 week

## 2016-08-20 NOTE — Patient Instructions (Signed)
Chronic Obstructive Pulmonary Disease Chronic obstructive pulmonary disease (COPD) is a common lung condition in which airflow from the lungs is limited. COPD is a general term that can be used to describe many different lung problems that limit airflow, including both chronic bronchitis and emphysema. If you have COPD, your lung function will probably never return to normal, but there are measures you can take to improve lung function and make yourself feel better. What are the causes?  Smoking (common).  Exposure to secondhand smoke.  Genetic problems.  Chronic inflammatory lung diseases or recurrent infections. What are the signs or symptoms?  Shortness of breath, especially with physical activity.  Deep, persistent (chronic) cough with a large amount of thick mucus.  Wheezing.  Rapid breaths (tachypnea).  Gray or bluish discoloration (cyanosis) of the skin, especially in your fingers, toes, or lips.  Fatigue.  Weight loss.  Frequent infections or episodes when breathing symptoms become much worse (exacerbations).  Chest tightness. How is this diagnosed? Your health care provider will take a medical history and perform a physical examination to diagnose COPD. Additional tests for COPD may include:  Lung (pulmonary) function tests.  Chest X-ray.  CT scan.  Blood tests. How is this treated? Treatment for COPD may include:  Inhaler and nebulizer medicines. These help manage the symptoms of COPD and make your breathing more comfortable.  Supplemental oxygen. Supplemental oxygen is only helpful if you have a low oxygen level in your blood.  Exercise and physical activity. These are beneficial for nearly all people with COPD.  Lung surgery or transplant.  Nutrition therapy to gain weight, if you are underweight.  Pulmonary rehabilitation. This may involve working with a team of health care providers and specialists, such as respiratory, occupational, and physical  therapists. Follow these instructions at home:  Take all medicines (inhaled or pills) as directed by your health care provider.  Avoid over-the-counter medicines or cough syrups that dry up your airway (such as antihistamines) and slow down the elimination of secretions unless instructed otherwise by your health care provider.  If you are a smoker, the most important thing that you can do is stop smoking. Continuing to smoke will cause further lung damage and breathing trouble. Ask your health care provider for help with quitting smoking. He or she can direct you to community resources or hospitals that provide support.  Avoid exposure to irritants such as smoke, chemicals, and fumes that aggravate your breathing.  Use oxygen therapy and pulmonary rehabilitation if directed by your health care provider. If you require home oxygen therapy, ask your health care provider whether you should purchase a pulse oximeter to measure your oxygen level at home.  Avoid contact with individuals who have a contagious illness.  Avoid extreme temperature and humidity changes.  Eat healthy foods. Eating smaller, more frequent meals and resting before meals may help you maintain your strength.  Stay active, but balance activity with periods of rest. Exercise and physical activity will help you maintain your ability to do things you want to do.  Preventing infection and hospitalization is very important when you have COPD. Make sure to receive all the vaccines your health care provider recommends, especially the pneumococcal and influenza vaccines. Ask your health care provider whether you need a pneumonia vaccine.  Learn and use relaxation techniques to manage stress.  Learn and use controlled breathing techniques as directed by your health care provider. Controlled breathing techniques include: 1. Pursed lip breathing. Start by breathing in (inhaling)   through your nose for 1 second. Then, purse your lips as  if you were going to whistle and breathe out (exhale) through the pursed lips for 2 seconds. 2. Diaphragmatic breathing. Start by putting one hand on your abdomen just above your waist. Inhale slowly through your nose. The hand on your abdomen should move out. Then purse your lips and exhale slowly. You should be able to feel the hand on your abdomen moving in as you exhale.  Learn and use controlled coughing to clear mucus from your lungs. Controlled coughing is a series of short, progressive coughs. The steps of controlled coughing are: 1. Lean your head slightly forward. 2. Breathe in deeply using diaphragmatic breathing. 3. Try to hold your breath for 3 seconds. 4. Keep your mouth slightly open while coughing twice. 5. Spit any mucus out into a tissue. 6. Rest and repeat the steps once or twice as needed. Contact a health care provider if:  You are coughing up more mucus than usual.  There is a change in the color or thickness of your mucus.  Your breathing is more labored than usual.  Your breathing is faster than usual. Get help right away if:  You have shortness of breath while you are resting.  You have shortness of breath that prevents you from:  Being able to talk.  Performing your usual physical activities.  You have chest pain lasting longer than 5 minutes.  Your skin color is more cyanotic than usual.  You measure low oxygen saturations for longer than 5 minutes with a pulse oximeter. This information is not intended to replace advice given to you by your health care provider. Make sure you discuss any questions you have with your health care provider. Document Released: 03/07/2005 Document Revised: 11/03/2015 Document Reviewed: 01/22/2013 Elsevier Interactive Patient Education  2017 [employer]vier Inc.  

## 2016-08-23 ENCOUNTER — Encounter: Payer: Self-pay | Admitting: Physician Assistant

## 2016-08-23 ENCOUNTER — Telehealth: Payer: Self-pay | Admitting: Internal Medicine

## 2016-08-23 MED ORDER — GLYCOPYRROLATE-FORMOTEROL 9-4.8 MCG/ACT IN AERO: 2.0000 | INHALATION_SPRAY | Freq: Two times a day (BID) | RESPIRATORY_TRACT | 5 refills | Status: DC

## 2016-08-23 MED FILL — !BEVESPI AEROSPHERE INHALER: 9-4.8 | 7 days supply | Qty: 1 | Fill #0

## 2016-08-23 NOTE — Telephone Encounter (Signed)
Spoke with pt. He needs a prescription for [facility] sent in. Rx has been sent in. Nothing further was needed.

## 2016-08-27 ENCOUNTER — Ambulatory Visit: Payer: Self-pay | Admitting: Internal Medicine

## 2016-08-27 MED FILL — BISOPROLOL FUMARATE 5 MG TA: 5 | 30 days supply | Qty: 30 | Fill #2

## 2016-09-03 ENCOUNTER — Ambulatory Visit (INDEPENDENT_AMBULATORY_CARE_PROVIDER_SITE_OTHER): Payer: Self-pay | Admitting: Physician Assistant

## 2016-09-03 ENCOUNTER — Encounter: Payer: Self-pay | Admitting: Physician Assistant

## 2016-09-03 ENCOUNTER — Ambulatory Visit (INDEPENDENT_AMBULATORY_CARE_PROVIDER_SITE_OTHER): Payer: Self-pay | Admitting: *Deleted

## 2016-09-03 VITALS — BP 132/60 | HR 73 | Ht 73.0 in | Wt 195.8 lb

## 2016-09-03 DIAGNOSIS — I429 Cardiomyopathy, unspecified: Secondary | ICD-10-CM

## 2016-09-03 DIAGNOSIS — I483 Typical atrial flutter: Secondary | ICD-10-CM

## 2016-09-03 DIAGNOSIS — I10 Essential (primary) hypertension: Secondary | ICD-10-CM

## 2016-09-03 DIAGNOSIS — R55 Syncope and collapse: Secondary | ICD-10-CM

## 2016-09-03 DIAGNOSIS — I2510 Atherosclerotic heart disease of native coronary artery without angina pectoris: Secondary | ICD-10-CM

## 2016-09-03 DIAGNOSIS — I420 Dilated cardiomyopathy: Secondary | ICD-10-CM

## 2016-09-03 DIAGNOSIS — K761 Chronic passive congestion of liver: Secondary | ICD-10-CM

## 2016-09-03 DIAGNOSIS — Z5181 Encounter for therapeutic drug level monitoring: Secondary | ICD-10-CM

## 2016-09-03 DIAGNOSIS — I5042 Chronic combined systolic (congestive) and diastolic (congestive) heart failure: Secondary | ICD-10-CM

## 2016-09-03 LAB — POCT INR: INR: 1.3

## 2016-09-03 MED ORDER — SACUBITRIL-VALSARTAN 24-26 MG PO TABS: 1.0000 | ORAL_TABLET | Freq: Two times a day (BID) | ORAL | 3 refills | Status: DC

## 2016-09-03 MED FILL — LOSARTAN POTASSIUM 25 MG TA: 25 | 30 days supply | Qty: 30 | Fill #1

## 2016-09-03 MED FILL — ?SPIRONOLACTONE 25 MG TABLE: 25 | 30 days supply | Qty: 30 | Fill #1

## 2016-09-03 MED FILL — **ENTRESTO 24-26 MG TABLET: 24-26 | 14 days supply | Qty: 28 | Fill #0

## 2016-09-03 MED FILL — HYOSCYAMINE 0.125 MG TAB SL: 0.125 | 10 days supply | Qty: 30 | Fill #1

## 2016-09-03 NOTE — Progress Notes (Signed)
Cardiology Office Note:    Date:  09/03/2016   ID:  Jared [name], DOB 29-Jun-1955, MRN [id number]  PCP:  Jaclyn Shaggy, MD  Cardiologist:Dr. Dietrich Pates Electrophysiologist: n/a Pulmonology: Dr. [name]  Referring MD: Jaclyn Shaggy, MD   Chief Complaint  Patient presents with  . Congestive Heart Failure    Follow up    History of Present Illness:    Jared [name] is a 61 y.o. male with a hx of combined systolic and diastolic CHF, nonischemic cardiomyopathy with EF ~35%, mild nonobstructive CAD at cardiac catheterization in 2012, prior CVA, HTN, tobacco abuse, COPD.  CHADS2-VASc=5 (CHF, CVA, HTN, vascular dz).   He was admitted in 1/18 with a/c combined systolic and diastolic HF in the setting of AFlutter. EF was 30-35 on echo. He had cirrhotic changes on abdominal US. He was placed on Coumadin Rx. He FU 07/13/16 with me and was still in AFlutter. I adjusted his beta-blocker and recommended close follow up with an eye towards DCCV after 3-4 weeks of therapeutic anticoagulation.   He was seen in the ED 07/25/16 with ?syncope/near syncope. ECG demonstrated NSR. ETOH level was high (70). BNP was high at 549. Troponin was neg x 1. Of note, LFTs have remained high.   I saw him in 2/18.  He was volume overloaded and I adjusted his diuretics.  But, his labs suggested contracture alkalosis.  I reduced his diuretic dose.  Because of his recent trip to the ED for syncope, I set him up for a Holter monitor which demonstrated NSR, PVCs/PACs and short burst of PAT.  I also referred him to GI for cirrhosis.  He saw GI in 2/18.  A follow up abdominal ultrasound was normal, which raises the question of hepatic congestion/"cardiac cirrhosis".    He returns today for follow up.  He is here alone. Since last seen, he has done well. His dyspnea is stable. He denies chest discomfort, orthopnea, PND or edema. He has a chronic cough. He continues to smoke about 3 cigarettes a day. He denies  syncope.  Prior CV studies:   The following studies were reviewed today:  Holter 3/18 Sinus rhythm  64 to 117 bpm  Average HR 83 bpm   Occasoinal PVCs,  PACs Short burst of PAT NO significant pauses No symtpoms recorded    Echo 07/04/16  Moderate LVH, EF 30-35, diffuse HK aortic sclerosis, trivial MR, moderate LAE, mildly reduced RVSF, moderate RAE, mild TR, PASP 35, small pericardial effusion  Echo 01/10/16 EF 35-40%, diffuse HK, diastolic dysfunction, aortic sclerosis, trivial MR, moderate LAE, normal RVSF, moderate RAE, mild TR, PASP 42 mmHg  Myoview 4/16 Overall Impression: High risk stress nuclear study There is no evidence of ischemia. There is severe LV dysfunction. LV Ejection Fraction: 30%. LV Wall Motion: There is global LV hypokinesis.   Echo 4/16 Mild LVH, EF 40-45%, diffuse HK  Cardiac cath(10/11/2010) Done for [facility]) Known cardiomyopathy prior. LM normal  LAD 20% irregularities LCX 20% proximal RCA: Dominant; 40% proximal, 40% distal LVEF 35% with diffuse hypokinesis.  Past Medical History:  Diagnosis Date  . CAD (coronary artery disease)    a. LHC 5/12:  LAD 20, pLCx 20, pRCA 40, dRCA 40, EF 35%, diff HK  //  b. Myoview 4/16: Overall Impression: High risk stress nuclear study There is no evidence of ischemia. There is severe LV dysfunction. LV Ejection Fraction: 30%. LV Wall Motion: There is global LV hypokinesis.   Marland Kitchen CAP (community acquired  pneumonia) 09/2013  . Chronic combined systolic and diastolic CHF (congestive heart failure) (HCC)    a. Echo 4/16:Mild LVH, EF 40-45%, diffuse HK //  b. Echo 8/17: EF 35-40%, diffuse HK, diastolic dysfunction, aortic sclerosis, trivial MR, moderate LAE, normal RVSF, moderate RAE, mild TR, PASP 42 mmHg  . Cluster headache    "hx; haven't had one in awhile" (01/09/2016)  . COPD (chronic obstructive pulmonary disease) (HCC)    [name] 01/09/2016  . History of CVA (cerebrovascular  accident)   . Hypertension   . NICM (nonischemic cardiomyopathy) (HCC)   . Tobacco abuse     Past Surgical History:  Procedure Laterality Date  . CARDIAC CATHETERIZATION  10/2010   LM normal, LAD with 20% irregularities, LCX with 20%, RCA with 40% prox and 40% distal - EF of 35%  . COLONOSCOPY W/ BIOPSIES AND POLYPECTOMY    . EXCISION MASS HEAD    . VIDEO BRONCHOSCOPY Bilateral 05/08/2016   Procedure: VIDEO BRONCHOSCOPY WITH FLUORO;  Surgeon: Oretha Milch, MD;  Location: [facility] ENDOSCOPY;  Service: Cardiopulmonary;  Laterality: Bilateral;    Current Medications: Current Meds  Medication Sig  . albuterol (PROVENTIL HFA;VENTOLIN HFA) 108 (90 Base) MCG/ACT inhaler Inhale 2 puffs into the lungs every 6 (six) hours as needed for wheezing or shortness of breath.  . bisoprolol (ZEBETA) 5 MG tablet Take 1 tablet (5 mg total) by mouth daily.  . furosemide (LASIX) 40 MG tablet Take 1.5 tablets (60 mg total) by mouth daily.  . Glycopyrrolate-Formoterol (BEVESPI AEROSPHERE) 9-4.8 MCG/ACT AERO Inhale 2 puffs into the lungs 2 (two) times daily.  . hyoscyamine (LEVSIN SL) 0.125 MG SL tablet Place 1 tablet (0.125 mg total) under the tongue every 8 (eight) hours as needed.  . OXYGEN 2lpm 24/7  . spironolactone (ALDACTONE) 25 MG tablet Take 1 tablet (25 mg total) by mouth daily.  Marland Kitchen warfarin (COUMADIN) 7.5 MG tablet Take as directed by coumadin clinic  . [DISCONTINUED] losartan (COZAAR) 25 MG tablet Take 1 tablet (25 mg total) by mouth daily.     Allergies:   Lisinopril   Social History   Social History  . Marital status: Single    Spouse name: N/A  . Number of children: 1  . Years of education: N/A   Occupational History  . retired    Social History Main Topics  . Smoking status: Current Every Day Smoker    Packs/day: 0.25    Years: 42.00    Types: Cigarettes  . Smokeless tobacco: Never Used     Comment: 3 cigs daily  . Alcohol use 0.0 oz/week     Comment: last drink was before xmas  .  Drug use: No     Comment: "nothing in 20 years"  . Sexual activity: Not Currently   Other Topics Concern  . None   Social History Narrative   unemployed        Family History  Problem Relation Age of Onset  . Heart disease Mother   . Diabetes Mother   . Colon cancer Mother   . Liver cancer Mother   . Cancer Father     type unknown  . Diabetes Sister     x 2  . Diabetes Brother      ROS:   Please see the history of present illness.    Review of Systems  Respiratory: Positive for cough, snoring and wheezing.    All other systems reviewed and are negative.   EKGs/Labs/Other Test Reviewed:  EKG:  EKG is  ordered today.  The ekg ordered today demonstrates NSR, HR 73, left axis deviation, poor R-wave progression, QTc 453 ms, no significant changes since prior tracing  Recent Labs: 07/03/2016: Magnesium 1.7 07/04/2016: TSH 1.268 07/25/2016: Hemoglobin 13.7; Platelets 144 07/30/2016: ALT 72; Brain Natriuretic Peptide 1,458.6 08/03/2016: NT-Pro BNP 2,541 08/15/2016: BUN 15; Creatinine, Ser 0.98; Potassium 4.4; Sodium 143   Recent Lipid Panel    Component Value Date/Time   CHOL 147 01/11/2016 0446   TRIG 69 01/11/2016 0446   HDL 53 01/11/2016 0446   CHOLHDL 2.8 01/11/2016 0446   VLDL 14 01/11/2016 0446   LDLCALC 80 01/11/2016 0446   Abdominal US 08/16/16 IMPRESSION: ULTRASOUND ABDOMEN: Normal abdominal ultrasound.  Physical Exam:    VS:  BP 132/60   Pulse 73   Ht 6\' 1"  (1.854 m)   Wt 195 lb 12.8 oz (88.8 kg)   BMI 25.83 kg/m     Wt Readings from Last 3 Encounters:  09/03/16 195 lb 12.8 oz (88.8 kg)  08/20/16 192 lb 12.8 oz (87.5 kg)  08/10/16 199 lb (90.3 kg)     Physical Exam  Constitutional: He is oriented to person, place, and time. He appears well-developed and well-nourished. No distress.  HENT:  Head: Normocephalic and atraumatic.  Eyes: No scleral icterus.  Neck: Normal range of motion. No JVD present.  Cardiovascular: Normal rate, regular rhythm,  S1 normal and S2 normal.   No murmur heard. Pulmonary/Chest: Effort normal. He has decreased breath sounds. He has no wheezes. He has no rhonchi. He has no rales.  Abdominal: Soft. There is no hepatomegaly. There is tenderness (mild) in the right upper quadrant.  Musculoskeletal: He exhibits no edema.  Neurological: He is alert and oriented to person, place, and time.  Skin: Skin is warm and dry.  Psychiatric: He has a normal mood and affect.    ASSESSMENT:    1. Chronic combined systolic and diastolic CHF (congestive heart failure) (HCC)   2. Nonischemic dilated cardiomyopathy (HCC)   3. Hepatic congestion   4. Syncope, unspecified syncope type   5. Typical atrial flutter (HCC)   6. Essential hypertension   7. Coronary artery disease involving native coronary artery of native heart without angina pectoris    PLAN:    In order of problems listed above:  1. Chronic combined systolic and diastolic CHF (congestive heart failure) (HCC) - NYHA IIb. Volume is stable. He remains on a good medical regimen which includes beta blocker, ARB and spironolactone. As his EF is less than 40% and he is NYHA IIb, I will try to initiate Entresto. He previously had difficulty affording this. He should be able to get this at his pharmacy at the [facility]. We have also provided samples today.   -  DC losartan  -  Start Entresto 24/26 mg twice a day  -  BMET 1 week  2. Nonischemic dilated cardiomyopathy (HCC) - Repeat Echo planned in 09/2016.  If EF < 35, refer to EP to consider ICD.    3. Hepatic congestion - As noted, his recent abdominal ultrasound demonstrates a normal liver. He does have follow-up with GI. However, I suspect that his cirrhotic changes were more than likely related to hepatic congestion from heart failure rather than liver parenchymal disease.  4. Syncope, unspecified syncope type - No recurrence.  Holter monitor did not demonstrate arrhythmias.    5.  Typical atrial flutter (HCC) - Maintaining NSR.  Continue  Coumadin.    6. Essential hypertension - BP controlled.   7. Coronary artery disease involving native coronary artery of native heart without angina pectoris - Mild CAD by prior cardiac catheterization. He is not on aspirin as he is on Coumadin. For now, statin therapy will be avoided given recent liver enzyme elevations.   Dispo:  Return in about 5 weeks (around 10/11/2016) for w/ Dr. Tenny Craw, Scheduled Follow Up.   Medication Adjustments/Labs and Tests Ordered: Current medicines are reviewed at length with the patient today.  Concerns regarding medicines are outlined above.  Medication changes, Labs and Tests ordered today are outlined in the Patient Instructions noted below. Patient Instructions  Medication Instructions:  1. STOP LOSARTAN 2. 24 HOURS FROM YOUR LAST LOSARTAN YOU WILL START ENTRESTO 24/26 MG 1 TABLET TWICE DAILY  Labwork: BMET TO BE DONE IN 1 WEEK  Testing/Procedures: KEEP YOUR ECHO UPCOMING APPT YOU  HAVE ALREADY SCHEDULED ON 10/03/16  Follow-Up: KEEP YOUR UPCOMING APPT WITH DR. ROSS ON 10/11/16  Any Other Special Instructions Will Be Listed Below (If Applicable).  If you need a refill on your cardiac medications before your next appointment, please call your pharmacy.  Signed, Tereso Newcomer, PA-C  09/03/2016 12:13 PM    [facility] [address] [place], [place], [place]  [address] Phone: [phone]; Fax: [phone]

## 2016-09-03 NOTE — Patient Instructions (Addendum)
Medication Instructions:  1. STOP LOSARTAN 2. 24 HOURS FROM YOUR LAST LOSARTAN YOU WILL START ENTRESTO 24/26 MG 1 TABLET TWICE DAILY  Labwork: BMET TO BE DONE IN 1 WEEK  Testing/Procedures: KEEP YOUR ECHO UPCOMING APPT YOU  HAVE ALREADY SCHEDULED ON 10/03/16  Follow-Up: KEEP YOUR UPCOMING APPT WITH DR. ROSS ON 10/11/16  Any Other Special Instructions Will Be Listed Below (If Applicable).  If you need a refill on your cardiac medications before your next appointment, please call your pharmacy.

## 2016-09-04 ENCOUNTER — Other Ambulatory Visit: Payer: Self-pay

## 2016-09-04 DIAGNOSIS — J441 Chronic obstructive pulmonary disease with (acute) exacerbation: Secondary | ICD-10-CM

## 2016-09-04 MED ORDER — UMECLIDINIUM-VILANTEROL 62.5-25 MCG/INH IN AEPB: 1.0000 | INHALATION_SPRAY | Freq: Every day | RESPIRATORY_TRACT | 3 refills | Status: DC

## 2016-09-04 MED ORDER — ALBUTEROL SULFATE HFA 108 (90 BASE) MCG/ACT IN AERS: 2.0000 | INHALATION_SPRAY | Freq: Four times a day (QID) | RESPIRATORY_TRACT | 3 refills | Status: DC | PRN

## 2016-09-04 MED ORDER — ALBUTEROL SULFATE HFA 108 (90 BASE) MCG/ACT IN AERS: 2.0000 | INHALATION_SPRAY | Freq: Four times a day (QID) | RESPIRATORY_TRACT | 2 refills | Status: DC | PRN

## 2016-09-04 MED FILL — PROVENTIL HFA 90 MCG INH: 108 (90 BAS | 25 days supply | Qty: 7 | Fill #0

## 2016-09-05 ENCOUNTER — Ambulatory Visit (INDEPENDENT_AMBULATORY_CARE_PROVIDER_SITE_OTHER): Payer: Self-pay | Admitting: Gastroenterology

## 2016-09-05 ENCOUNTER — Encounter: Payer: Self-pay | Admitting: Gastroenterology

## 2016-09-05 VITALS — BP 120/64 | HR 68 | Ht 73.0 in | Wt 194.0 lb

## 2016-09-05 DIAGNOSIS — J449 Chronic obstructive pulmonary disease, unspecified: Secondary | ICD-10-CM

## 2016-09-05 DIAGNOSIS — R945 Abnormal results of liver function studies: Principal | ICD-10-CM

## 2016-09-05 DIAGNOSIS — K761 Chronic passive congestion of liver: Secondary | ICD-10-CM

## 2016-09-05 DIAGNOSIS — I420 Dilated cardiomyopathy: Secondary | ICD-10-CM

## 2016-09-05 DIAGNOSIS — I429 Cardiomyopathy, unspecified: Secondary | ICD-10-CM

## 2016-09-05 DIAGNOSIS — R7989 Other specified abnormal findings of blood chemistry: Secondary | ICD-10-CM

## 2016-09-05 NOTE — Patient Instructions (Signed)
If you are age 63 or older, your body mass index should be between 23-30. Your Body mass index is 25.6 kg/m. If this is out of the aforementioned range listed, please consider follow up with your Primary Care Provider.  If you are age 62 or younger, your body mass index should be between 19-25. Your Body mass index is 25.6 kg/m. If this is out of the aformentioned range listed, please consider follow up with your Primary Care Provider.   Thank you for choosing [facility]  Dr Amada Jupiter III

## 2016-09-05 NOTE — Progress Notes (Signed)
     [facility] Progress Note  Chief Complaint: Abnormal LFTs  Subjective  History:  This is follow-up for 61 year old man with congestive heart failure seen about a month ago by our PA for elevated LFTs.  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  Additional lab workup is as noted below.  He is somewhat evasive about his alcohol use, and he did recently have a positive alcohol level. It sounds like it is still ongoing and intermittent. There was initial concern from an ultrasound that perhaps he has cirrhosis, but an ultrasound with elastography showed F2/F3 fibrosis. No liver lesions were seen.  ROS: Cardiovascular:  no chest pain Respiratory: Chronic dyspnea with exertion  The patient's Past Medical, Family and Social History were reviewed and are on file in the EMR.  Objective:  Med list reviewed  Vital signs in last 24 hrs: Vitals:   09/05/16 1542  BP: 120/64  Pulse: 68    Physical Exam    HEENT: sclera anicteric, oral mucosa moist without lesions  Neck: supple, no thyromegaly, JVD or lymphadenopathy  Cardiac: RRR without murmurs, S1S2 heard, no peripheral edema  Pulm: clear to auscultation bilaterally, normal RR and effort noted  Abdomen: soft, No tenderness, with active bowel sounds. No guarding or palpable hepatosplenomegaly.  Skin; warm and dry, no jaundice or rash  Recent Labs:  Neg HBV, HCV INR 1.3  Iron 75, ferrtin 95 Radiologic studies:  [place] elastography F2/F3 No liver lesions, no ascites His last echocardiogram showed elevated right-sided pressure  @ASSESSMENTPLANBEGIN @ Assessment: Encounter Diagnoses  Name Primary?  . Elevated LFTs Yes  . Nonischemic dilated cardiomyopathy (HCC)   . COPD GOLD III/still smoking    . Hepatic congestion    His elevated LFTs appear to be from passive hepatic congestion due to dilated cardiomyopathy. His lab workup is not consistent with  hemachromatosis and he does not of viral hepatitis. I'm afraid alcohol use is exacerbating this, and I have counseled him to quit.   Plan:  He was counseled to quit smoking and stopped drinking alcohol See me as needed Total time 20 minutes, over half spent in counseling and coordination of care.   Charlie Pitter III

## 2016-09-10 ENCOUNTER — Other Ambulatory Visit: Payer: Self-pay | Admitting: *Deleted

## 2016-09-10 ENCOUNTER — Ambulatory Visit (INDEPENDENT_AMBULATORY_CARE_PROVIDER_SITE_OTHER): Payer: Self-pay | Admitting: *Deleted

## 2016-09-10 ENCOUNTER — Other Ambulatory Visit (HOSPITAL_COMMUNITY): Payer: Self-pay

## 2016-09-10 DIAGNOSIS — I5042 Chronic combined systolic (congestive) and diastolic (congestive) heart failure: Secondary | ICD-10-CM

## 2016-09-10 DIAGNOSIS — I483 Typical atrial flutter: Secondary | ICD-10-CM

## 2016-09-10 DIAGNOSIS — Z5181 Encounter for therapeutic drug level monitoring: Secondary | ICD-10-CM

## 2016-09-10 DIAGNOSIS — I10 Essential (primary) hypertension: Secondary | ICD-10-CM

## 2016-09-10 LAB — BASIC METABOLIC PANEL
BUN/Creatinine Ratio: 12 (ref 10–24)
BUN: 13 mg/dL (ref 8–27)
CO2: 28 mmol/L (ref 18–29)
Calcium: 9 mg/dL (ref 8.6–10.2)
Chloride: 102 mmol/L (ref 96–106)
Creatinine, Ser: 1.06 mg/dL (ref 0.76–1.27)
GFR calc Af Amer: 87 mL/min/{1.73_m2} (ref >59)
GFR calc non Af Amer: 75 mL/min/{1.73_m2} (ref >59)
Glucose: 88 mg/dL (ref 65–99)
Potassium: 3.6 mmol/L (ref 3.5–5.2)
Sodium: 144 mmol/L (ref 134–144)

## 2016-09-10 LAB — POCT INR: INR: 2.8

## 2016-09-11 ENCOUNTER — Telehealth: Payer: Self-pay | Admitting: *Deleted

## 2016-09-11 NOTE — Telephone Encounter (Signed)
-----   Message from Beatrice Lecher, [place] sent at 09/11/2016  4:41 PM EDT ----- Please call patient: The kidney function (BUN, Creatinine) and potassium are normal. All other parameters are within acceptable limits and no further intervention or testing required. Continue with current treatment plan. Tereso Newcomer, PA-C   09/11/2016 4:41 PM

## 2016-09-11 NOTE — Telephone Encounter (Signed)
Lmom lab work looks good. Continue on current Tx plan. If any questions please call [phone].

## 2016-09-20 ENCOUNTER — Encounter: Payer: Self-pay | Admitting: Internal Medicine

## 2016-09-21 ENCOUNTER — Other Ambulatory Visit: Payer: Self-pay

## 2016-09-21 DIAGNOSIS — I5022 Chronic systolic (congestive) heart failure: Secondary | ICD-10-CM

## 2016-09-21 MED ORDER — SACUBITRIL-VALSARTAN 24-26 MG PO TABS: 1.0000 | ORAL_TABLET | Freq: Two times a day (BID) | ORAL | 3 refills | Status: DC

## 2016-10-03 ENCOUNTER — Other Ambulatory Visit: Payer: Self-pay

## 2016-10-03 ENCOUNTER — Telehealth: Payer: Self-pay | Admitting: *Deleted

## 2016-10-03 ENCOUNTER — Ambulatory Visit (HOSPITAL_COMMUNITY): Payer: Self-pay | Attending: Cardiology

## 2016-10-03 ENCOUNTER — Encounter: Payer: Self-pay | Admitting: Physician Assistant

## 2016-10-03 ENCOUNTER — Other Ambulatory Visit: Payer: Self-pay | Admitting: Physician Assistant

## 2016-10-03 ENCOUNTER — Ambulatory Visit (INDEPENDENT_AMBULATORY_CARE_PROVIDER_SITE_OTHER): Payer: Self-pay | Admitting: *Deleted

## 2016-10-03 DIAGNOSIS — I420 Dilated cardiomyopathy: Secondary | ICD-10-CM

## 2016-10-03 DIAGNOSIS — Z5181 Encounter for therapeutic drug level monitoring: Secondary | ICD-10-CM

## 2016-10-03 DIAGNOSIS — I5042 Chronic combined systolic (congestive) and diastolic (congestive) heart failure: Secondary | ICD-10-CM

## 2016-10-03 DIAGNOSIS — I483 Typical atrial flutter: Secondary | ICD-10-CM

## 2016-10-03 LAB — POCT INR: INR: 1

## 2016-10-03 MED ORDER — WARFARIN SODIUM 7.5 MG PO TABS: ORAL_TABLET | ORAL | 3 refills | Status: DC

## 2016-10-03 MED FILL — WARFARIN NA 7.5 MG TAB: 7.5 | 30 days supply | Qty: 40 | Fill #0

## 2016-10-03 NOTE — Telephone Encounter (Signed)
-----   Message from Beatrice Lecher, [place] sent at 10/03/2016  4:34 PM EDT ----- Please call the patient. Echocardiogram demonstrates no change in ejection fraction. Keep follow up appointment with Dr. Dietrich Pates next week. Refer to EP for evaluation for ICD.  Make sure this is after he sees Dr. Dietrich Pates.  Dr. Tenny Craw can review further with the patient at follow up.  Please fax a copy of this study result to his PCP:  Jaclyn Shaggy, MD  Thanks! Tereso Newcomer, PA-C    10/03/2016 4:32 PM

## 2016-10-03 NOTE — Telephone Encounter (Signed)
Pt notified of Limited Echo results and findings by phone with verbal understanding to results given today. Pt agreeable to referral to EP to discuss possible ICD. Pt aware Glynda Jaeger scheduler for EP will call and schedule appt with one of the EP dr. Rock Nephew thanked me for my call today.

## 2016-10-10 ENCOUNTER — Encounter: Payer: Self-pay | Admitting: Cardiology

## 2016-10-10 ENCOUNTER — Other Ambulatory Visit: Payer: Self-pay | Admitting: Cardiology

## 2016-10-10 ENCOUNTER — Ambulatory Visit (INDEPENDENT_AMBULATORY_CARE_PROVIDER_SITE_OTHER): Payer: Self-pay | Admitting: Cardiology

## 2016-10-10 DIAGNOSIS — I428 Other cardiomyopathies: Secondary | ICD-10-CM

## 2016-10-10 DIAGNOSIS — I5022 Chronic systolic (congestive) heart failure: Secondary | ICD-10-CM

## 2016-10-10 NOTE — Progress Notes (Addendum)
Electrophysiology Office Note   Date:  10/10/2016   ID:  Jared [name], DOB 13-Oct-1955, MRN [id number]  PCP:  Jaclyn Shaggy, MD  Cardiologist:  Tenny Craw Primary Electrophysiologist:  Omayra Tulloch Jorja Loa, MD    Chief Complaint  Patient presents with  . Congestive Heart Failure     History of Present Illness: Jared [name] is a 61 y.o. male who is being seen today for the evaluation of CHF at the request of Jaclyn Shaggy, MD. Presenting today for electrophysiology evaluation. He has a history of combined systolic and diastolic heart failure, nonischemic cardiomyopathy with an EF 35%, mild nonobstructive coronary disease In 2012, prior CVA, hypertension, tobacco abuse, and COPD. He was admitted on 1/18 with combined systolic and diastolic heart failure in the setting of atrial flutter. His EF at that time was found to be 30-35%. He had cirrhotic changes on an abdominal ultrasound at the time. He was placed on Coumadin and was cardioverted after 3-4 weeks of anticoagulation. On 07/25/16, he presented to the hospital with syncope versus near syncope. EKG demonstrated sinus rhythm. Alcohol level was high at 70. BNP was 549. He had a Holter monitor placed which demonstrated sinus rhythm, PACs and PVCs and short burst of PAT.    Today, he denies symptoms of orthopnea, PND, lower extremity edema, claudication, dizziness, presyncope, syncope, bleeding, or neurologic sequela. The patient is tolerating medications without difficulties. He does have palpitations. His palpitations occur all the time. There are no exacerbating or alleviating factors. His palpitations are been going on for a few days.  Past Medical History:  Diagnosis Date  . CAD (coronary artery disease)    a. LHC 5/12:  LAD 20, pLCx 20, pRCA 40, dRCA 40, EF 35%, diff HK  //  b. Myoview 4/16: Overall Impression: High risk stress nuclear study There is no evidence of ischemia. There is severe LV dysfunction. LV Ejection Fraction: 30%. LV Wall  Motion: There is global LV hypokinesis.   Marland Kitchen CAP (community acquired pneumonia) 09/2013  . Chronic combined systolic and diastolic CHF (congestive heart failure) (HCC)    a. Echo 4/16:Mild LVH, EF 40-45%, diffuse HK //  b. Echo 8/17: EF 35-40%, diffuse HK, diastolic dysfunction, aortic sclerosis, trivial MR, moderate LAE, normal RVSF, moderate RAE, mild TR, PASP 42 mmHg // c. Echo 4/18: Mild concentric LVH, EF 30-35, normal wall motion, grade 1 diastolic dysfunction, PASP 49  . Cluster headache    "hx; haven't had one in awhile" (01/09/2016)  . COPD (chronic obstructive pulmonary disease) (HCC)    [name] 01/09/2016  . History of CVA (cerebrovascular accident)   . Hypertension   . NICM (nonischemic cardiomyopathy) (HCC)   . Tobacco abuse    Past Surgical History:  Procedure Laterality Date  . CARDIAC CATHETERIZATION  10/2010   LM normal, LAD with 20% irregularities, LCX with 20%, RCA with 40% prox and 40% distal - EF of 35%  . COLONOSCOPY W/ BIOPSIES AND POLYPECTOMY    . EXCISION MASS HEAD    . VIDEO BRONCHOSCOPY Bilateral 05/08/2016   Procedure: VIDEO BRONCHOSCOPY WITH FLUORO;  Surgeon: Oretha Milch, MD;  Location: [facility] ENDOSCOPY;  Service: Cardiopulmonary;  Laterality: Bilateral;     Current Outpatient Prescriptions  Medication Sig Dispense Refill  . albuterol (PROVENTIL HFA;VENTOLIN HFA) 108 (90 Base) MCG/ACT inhaler Inhale 2 puffs into the lungs every 6 (six) hours as needed for wheezing or shortness of breath. 1 Inhaler 2  . bisoprolol (ZEBETA) 5 MG tablet Take 1 tablet (5 mg  total) by mouth daily. 30 tablet 2  . furosemide (LASIX) 40 MG tablet Take 1.5 tablets (60 mg total) by mouth daily. 135 tablet 3  . Glycopyrrolate-Formoterol (BEVESPI AEROSPHERE) 9-4.8 MCG/ACT AERO Inhale 2 puffs into the lungs 2 (two) times daily. 1 Inhaler 5  . hyoscyamine (LEVSIN SL) 0.125 MG SL tablet Place 1 tablet (0.125 mg total) under the tongue every 8 (eight) hours as needed. 30 tablet 1  . losartan  (COZAAR) 25 MG tablet Take 1 tablet by mouth daily.  3  . OXYGEN 2lpm 24/7    . sacubitril-valsartan (ENTRESTO) 24-26 MG Take 1 tablet by mouth 2 (two) times daily. 180 tablet 3  . spironolactone (ALDACTONE) 25 MG tablet Take 1 tablet (25 mg total) by mouth daily. 90 tablet 3  . umeclidinium-vilanterol (ANORO ELLIPTA) 62.5-25 MCG/INH AEPB Inhale 1 puff into the lungs daily. 180 each 3  . warfarin (COUMADIN) 7.5 MG tablet Take as directed by coumadin clinic 40 tablet 3   No current facility-administered medications for this visit.     Allergies:   Lisinopril   Social History:  The patient  reports that he has been smoking Cigarettes.  He has a 10.50 pack-year smoking history. He has never used smokeless tobacco. He reports that he drinks alcohol. He reports that he does not use drugs.   Family History:  The patient's family history includes Cancer in his father; Colon cancer in his mother; Diabetes in his brother, mother, and sister; Heart disease in his mother; Liver cancer in his mother.    ROS:  Please see the history of present illness.   Otherwise, review of systems is positive for Chest pain, chest pressure, palpitations, cough, dyspnea on exertion, wheezing, anxiety, depression, headaches.   All other systems are reviewed and negative.    PHYSICAL EXAM: VS:  BP 118/86   Pulse 99   Ht 6\' 1"  (1.854 m)   Wt 191 lb 6.4 oz (86.8 kg)   SpO2 93%   BMI 25.25 kg/m  , BMI Body mass index is 25.25 kg/m. GEN: Well nourished, well developed, in no acute distress  HEENT: normal  Neck: no JVD, carotid bruits, or masses Cardiac: iRRR; no murmurs, rubs, or gallops,no edema  Respiratory:  clear to auscultation bilaterally, normal work of breathing GI: soft, nontender, nondistended, + BS MS: no deformity or atrophy  Skin: warm and dry Neuro:  Strength and sensation are intact Psych: euthymic mood, full affect  EKG:  EKG is ordered today. Personal review of the ekg ordered shows sinus  rhythm, rate 83, inferior Q waves, PRWP  Recent Labs: 07/03/2016: Magnesium 1.7 07/04/2016: TSH 1.268 07/25/2016: Hemoglobin 13.7; Platelets 144 07/30/2016: ALT 72; Brain Natriuretic Peptide 1,458.6 08/03/2016: NT-Pro BNP 2,541 09/10/2016: BUN 13; Creatinine, Ser 1.06; Potassium 3.6; Sodium 144    Lipid Panel     Component Value Date/Time   CHOL 147 01/11/2016 0446   TRIG 69 01/11/2016 0446   HDL 53 01/11/2016 0446   CHOLHDL 2.8 01/11/2016 0446   VLDL 14 01/11/2016 0446   LDLCALC 80 01/11/2016 0446     Wt Readings from Last 3 Encounters:  10/10/16 191 lb 6.4 oz (86.8 kg)  09/05/16 194 lb (88 kg)  09/03/16 195 lb 12.8 oz (88.8 kg)      Other studies Reviewed: Additional studies/ records that were reviewed today include: TTE 10/03/16  Review of the above records today demonstrates:  - Left ventricle: The cavity size was normal. There was mild   concentric  hypertrophy. Systolic function was moderately to   severely reduced. The estimated ejection fraction was in the   range of 30% to 35%. Wall motion was normal; there were no   regional wall motion abnormalities. Doppler parameters are   consistent with abnormal left ventricular relaxation (grade 1   diastolic dysfunction). - Pulmonary arteries: Systolic pressure was mildly to moderately   increased. PA peak pressure: 49 mm Hg (S).  Holter 3/18 - personally reviewed Sinus rhythm 64 to 117 bpm Average HR 83 bpm  Occasoinal PVCs, PACs Short burst of PAT No significant pauses No symtpoms recorded   ASSESSMENT AND PLAN:  1.  Chronic, systolic and diastolic heart failure: Has been started on Entresto and remains on his beta blocker, and Aldactone. Repeat echo showed an EF of 30-35%. His ejection fraction remains low despite optimal medical therapy. He was thus likely benefit from ICD implantation. Risks and benefits were discussed. Risks include bleeding, infection, tamponade, and pneumothorax. He understands these risks, Has  agreed to the procedure. He is uninsured, and we Tylee Yum have to work for financial assistance prior to ICD implantation.   2. Typical atrial flutter: Currently on Coumadin. Currently in sinus rhythm today. INRs have been subtherapeutic.   This patients CHA2DS2-VASc Score and unadjusted Ischemic Stroke Rate (% per year) is equal to 7.2 % stroke rate/year from a score of 5  Above score calculated as 1 point each if present [CHF, HTN, DM, Vascular=MI/PAD/Aortic Plaque, Age if 65-74, or Male] Above score calculated as 2 points each if present [Age > 75, or Stroke/TIA/TE]  3. Syncope: No cause has been discovered. With his history of systolic heart failure, it is worrisome that his syncope is arrhythmic in nature. Advised ICD therapy.  4. Coronary artery disease: Mild by prior catheterization. Is on Coumadin but no aspirin. Statins being avoided due to liver enzyme elevation.     Current medicines are reviewed at length with the patient today.   The patient does not have concerns regarding his medicines.  The following changes were made today:  none  Labs/ tests ordered today include: ECG No orders of the defined types were placed in this encounter.    Disposition:   FU with Andie Mungin 3 months  Signed, Patirica Longshore Jorja Loa, MD  10/10/2016 9:04 AM     [facility] [address] [place] [address] [phone] (office) [phone] (fax)

## 2016-10-10 NOTE — Patient Instructions (Signed)
Medication Instructions:    Your physician recommends that you continue on your current medications as directed. Please refer to the Current Medication list given to you today.  Labwork:  None ordered  Testing/Procedures: Your physician has recommended that you have a defibrillator inserted. An implantable cardioverter defibrillator (ICD) is a small device that is placed in your chest or, in rare cases, your abdomen. This device uses electrical pulses or shocks to help control life-threatening, irregular heartbeats that could lead the heart to suddenly stop beating (sudden cardiac arrest). Leads are attached to the ICD that goes into your heart. This is done in the hospital and usually requires an overnight stay.   Please complete financial assistance paperwork for the hospital.  Call Chantil Bari, RN  when you have gotten an answer about assistance.  Follow-Up:  To be determined  - If you need a refill on your cardiac medications before your next appointment, please call your pharmacy.   Thank you for choosing [facility]!!   Dory Horn, RN [phone]  Any Other Special Instructions Will Be Listed Below (If Applicable).  Cardioverter Defibrillator Implantation An implantable cardioverter defibrillator (ICD) is a small device that is placed under the skin in the chest or abdomen. An ICD consists of a battery, a small computer (pulse generator), and wires (leads) that go into the heart. An ICD is used to detect and correct two types of dangerous irregular heartbeats (arrhythmias):  A rapid heart rhythm (tachycardia).  An arrhythmia in which the lower chambers of the heart (ventricles) contract in an uncoordinated way (fibrillation). When an ICD detects tachycardia, it sends a low-energy shock to the heart to restore the heartbeat to normal (cardioversion). This signal is usually painless. If cardioversion does not work or if the ICD detects fibrillation, it delivers a high-energy  shock to the heart (defibrillation) to restart the heart. This shock may feel like a strong jolt in the chest. Your health care provider may prescribe an ICD if:  You have had an arrhythmia that originated in the ventricles.  Your heart has been damaged by a disease or heart condition. Sometimes, ICDs are programmed to act as a device called a pacemaker. Pacemakers can be used to treat a slow heartbeat (bradycardia) or tachycardia by taking over the heart rate with electrical impulses. Tell a health care provider about:  Any allergies you have.  All medicines you are taking, including vitamins, herbs, eye drops, creams, and over-the-counter medicines.  Any problems you or family members have had with anesthetic medicines.  Any blood disorders you have.  Any surgeries you have had.  Any medical conditions you have.  Whether you are pregnant or may be pregnant. What are the risks? Generally, this is a safe procedure. However, problems may occur, including:  Swelling, bleeding, or bruising.  Infection.  Blood clots.  Damage to other structures or organs, such as nerves, blood vessels, or the heart.  Allergic reactions to medicines used during the procedure. What happens before the procedure? Staying hydrated  Follow instructions from your health care provider about hydration, which may include:  Up to 2 hours before the procedure - you may continue to drink clear liquids, such as water, clear fruit juice, black coffee, and plain tea. Eating and drinking restrictions  Follow instructions from your health care provider about eating and drinking, which may include:  8 hours before the procedure - stop eating heavy meals or foods such as meat, fried foods, or fatty foods.  6 hours  before the procedure - stop eating light meals or foods, such as toast or cereal.  6 hours before the procedure - stop drinking milk or drinks that contain milk.  2 hours before the procedure - stop  drinking clear liquids. Medicine  Ask your health care provider about:  Changing or stopping your normal medicines. This is important if you take diabetes medicines or blood thinners.  Taking medicines such as aspirin and ibuprofen. These medicines can thin your blood. Do not take these medicines before your procedure if your doctor tells you not to. Tests   You may have blood tests.  You may have a test to check the electrical signals in your heart (electrocardiogram, ECG).  You may have imaging tests, such as a chest X-ray. General instructions   For 24 hours before the procedure, stop using products that contain nicotine or tobacco, such as cigarettes and e-cigarettes. If you need help quitting, ask your health care provider.  Plan to have someone take you home from the hospital or clinic.  You may be asked to shower with a germ-killing soap. What happens during the procedure?  To reduce your risk of infection:  Your health care team will wash or sanitize their hands.  Your skin will be washed with soap.  Hair may be removed from the surgical area.  Small monitors will be put on your body. They will be used to check your heart, blood pressure, and oxygen level.  An IV tube will be inserted into one of your veins.  You will be given one or more of the following:  A medicine to help you relax (sedative).  A medicine to numb the area (local anesthetic).  A medicine to make you fall asleep (general anesthetic).  Leads will be guided through a blood vessel into your heart and attached to your heart muscles. Depending on the ICD, the leads may go into one ventricle or they may go into both ventricles and into an upper chamber of the heart. An X-ray machine (fluoroscope) will be usedto help guide the leads.  A small incision will be made to create a deep pocket under your skin.  The pulse generator will be placed into the pocket.  The ICD will be tested.  The incision  will be closed with stitches (sutures), skin glue, or staples.  A bandage (dressing) will be placed over the incision. This procedure may vary among health care providers and hospitals. What happens after the procedure?  Your blood pressure, heart rate, breathing rate, and blood oxygen level will be monitored often until the medicines you were given have worn off.  A chest X-ray will be taken to check that the ICD is in the right place.  You will need to stay in the hospital for 1-2 days so your health care provider can make sure your ICD is working.  Do not drive for 24 hours if you received a sedative. Ask your health care provider when it is safe for you to drive.  You may be given an identification card explaining that you have an ICD. Summary  An implantable cardioverter defibrillator (ICD) is a small device that is placed under the skin in the chest or abdomen. It is used to detect and correct dangerous irregular heartbeats (arrhythmias).  An ICD consists of a battery, a small computer (pulse generator), and wires (leads) that go into the heart.  When an ICD detects rapid heart rhythm (tachycardia), it sends a low-energy shock to the  heart to restore the heartbeat to normal (cardioversion). If cardioversion does not work or if the ICD detects uncoordinated heart contractions (fibrillation), it delivers a high-energy shock to the heart (defibrillation) to restart the heart.  You will need to stay in the hospital for 1-2 days to make sure your ICD is working. This information is not intended to replace advice given to you by your health care provider. Make sure you discuss any questions you have with your health care provider. Document Released: 02/17/2002 Document Revised: 06/06/2016 Document Reviewed: 06/06/2016 Elsevier Interactive Patient Education  2017 [employer].

## 2016-10-10 NOTE — Progress Notes (Signed)
Cardiology Office Note   Date:  10/11/2016   ID:  Jared [name], DOB 1955-12-09, MRN [id number]  PCP:  Jaclyn Shaggy, MD  Cardiologist:   Dietrich Pates, MD   F/U of systolic CHF   History of Present Illness: Jared [name] is a 61 y.o. male with a history of HTN, tobacco abuse, mild CAD, CVA.  LVEf 45 to 50%  I saw him in clinic in March 2016.  Had some chest tightness  I recomm a Lexiscan myoview  This showed normal perfusion  Echo showed LVEF 40 to 45%.    I saw the pt in December 2016  He has been seen several times by Wende Mott since, last in March 2018 He was admtted in Jan 2018 with CHF in setting of atrial flutter  Placed on coumadin  Plan for d/c cardioversion Seen in Feb for syncope/near synco He was last seen by Wende Mott in March 2018   Since seen his breathing is OK  He denies Cp  No syncope   Current Outpatient Prescriptions  Medication Sig Dispense Refill  . albuterol (PROVENTIL HFA;VENTOLIN HFA) 108 (90 Base) MCG/ACT inhaler Inhale 2 puffs into the lungs every 6 (six) hours as needed for wheezing or shortness of breath. 1 Inhaler 2  . bisoprolol (ZEBETA) 5 MG tablet Take 1 tablet (5 mg total) by mouth daily. 30 tablet 2  . furosemide (LASIX) 40 MG tablet Take 1.5 tablets (60 mg total) by mouth daily. 135 tablet 3  . Glycopyrrolate-Formoterol (BEVESPI AEROSPHERE) 9-4.8 MCG/ACT AERO Inhale 2 puffs into the lungs 2 (two) times daily. 1 Inhaler 5  . hyoscyamine (LEVSIN SL) 0.125 MG SL tablet Place 1 tablet (0.125 mg total) under the tongue every 8 (eight) hours as needed. 30 tablet 1  . losartan (COZAAR) 25 MG tablet Take 1 tablet by mouth daily.  3  . OXYGEN 2lpm 24/7    . sacubitril-valsartan (ENTRESTO) 24-26 MG Take 1 tablet by mouth 2 (two) times daily. 180 tablet 3  . spironolactone (ALDACTONE) 25 MG tablet Take 1 tablet (25 mg total) by mouth daily. 90 tablet 3  . umeclidinium-vilanterol (ANORO ELLIPTA) 62.5-25 MCG/INH AEPB Inhale 1 puff into the lungs daily. 180 each 3  .  warfarin (COUMADIN) 7.5 MG tablet Take as directed by coumadin clinic 40 tablet 3   No current facility-administered medications for this visit.     Allergies:   Lisinopril   Past Medical History:  Diagnosis Date  . CAD (coronary artery disease)    a. LHC 5/12:  LAD 20, pLCx 20, pRCA 40, dRCA 40, EF 35%, diff HK  //  b. Myoview 4/16: Overall Impression: High risk stress nuclear study There is no evidence of ischemia. There is severe LV dysfunction. LV Ejection Fraction: 30%. LV Wall Motion: There is global LV hypokinesis.   Marland Kitchen CAP (community acquired pneumonia) 09/2013  . Chronic combined systolic and diastolic CHF (congestive heart failure) (HCC)    a. Echo 4/16:Mild LVH, EF 40-45%, diffuse HK //  b. Echo 8/17: EF 35-40%, diffuse HK, diastolic dysfunction, aortic sclerosis, trivial MR, moderate LAE, normal RVSF, moderate RAE, mild TR, PASP 42 mmHg // c. Echo 4/18: Mild concentric LVH, EF 30-35, normal wall motion, grade 1 diastolic dysfunction, PASP 49  . Cluster headache    "hx; haven't had one in awhile" (01/09/2016)  . COPD (chronic obstructive pulmonary disease) (HCC)    [name] 01/09/2016  . History of CVA (cerebrovascular accident)   . Hypertension   .  NICM (nonischemic cardiomyopathy) (HCC)   . Tobacco abuse     Past Surgical History:  Procedure Laterality Date  . CARDIAC CATHETERIZATION  10/2010   LM normal, LAD with 20% irregularities, LCX with 20%, RCA with 40% prox and 40% distal - EF of 35%  . COLONOSCOPY W/ BIOPSIES AND POLYPECTOMY    . EXCISION MASS HEAD    . VIDEO BRONCHOSCOPY Bilateral 05/08/2016   Procedure: VIDEO BRONCHOSCOPY WITH FLUORO;  Surgeon: Oretha Milch, MD;  Location: [facility] ENDOSCOPY;  Service: Cardiopulmonary;  Laterality: Bilateral;     Social History:  The patient  reports that he has been smoking Cigarettes.  He has a 10.50 pack-year smoking history. He has never used smokeless tobacco. He reports that he drinks alcohol. He reports that he does not use  drugs.   Family History:  The patient's family history includes Cancer in his father; Colon cancer in his mother; Diabetes in his brother, mother, and sister; Heart disease in his mother; Liver cancer in his mother.    ROS:  Please see the history of present illness. All other systems are reviewed and  Negative to the above problem except as noted.    PHYSICAL EXAM: VS:  BP 114/70   Pulse 63   Ht 6\' 2"  (1.88 m)   Wt 192 lb 6.4 oz (87.3 kg)   SpO2 93%   BMI 24.70 kg/m   GEN: Well nourished, well developed, in no acute distress  HEENT: normal  Neck: no JVD, carotid bruits, or masses Cardiac: RRR; no murmurs, rubs, or gallops,no edema  Respiratory:  clear to auscultation bilaterally, normal work of breathing GI: soft, nontender, nondistended, + BS  No hepatomegaly  MS: no deformity Moving all extremities   Skin: warm and dry, no rash Neuro:  Strength and sensation are intact Psych: euthymic mood, full affect   EKG:  EKG is not  ordered today.   Lipid Panel    Component Value Date/Time   CHOL 147 01/11/2016 0446   TRIG 69 01/11/2016 0446   HDL 53 01/11/2016 0446   CHOLHDL 2.8 01/11/2016 0446   VLDL 14 01/11/2016 0446   LDLCALC 80 01/11/2016 0446      Wt Readings from Last 3 Encounters:  10/11/16 192 lb 6.4 oz (87.3 kg)  10/10/16 191 lb 6.4 oz (86.8 kg)  09/05/16 194 lb (88 kg)      ASSESSMENT AND PLAN:  1.  Chronic systolic CHF  Volume status appears OK  I would keep on same meds  He is taking  I am reluctant to pushEntresto with hx of syncope/near syncope (may have been arhythmic)   Note he was seen by Carleene Mains  Working of coverage for ICD Pt with no insurance    Follow for now  2.  Mild CAD Asymptomatic    3.  HTN  BP is good    F/U in August.       Signed, Dietrich Pates, MD  10/11/2016 9:07 AM    [facility] [address] [place], [place], [place]  [address] Phone: [phone]; Fax: [phone]

## 2016-10-10 NOTE — Addendum Note (Signed)
Addended by: Madalyn Rob A on: 10/10/2016 09:40 AM   Modules accepted: Orders

## 2016-10-11 ENCOUNTER — Encounter: Payer: Self-pay | Admitting: Internal Medicine

## 2016-10-11 ENCOUNTER — Ambulatory Visit (INDEPENDENT_AMBULATORY_CARE_PROVIDER_SITE_OTHER): Payer: Self-pay | Admitting: Internal Medicine

## 2016-10-11 ENCOUNTER — Ambulatory Visit (INDEPENDENT_AMBULATORY_CARE_PROVIDER_SITE_OTHER): Payer: Self-pay | Admitting: *Deleted

## 2016-10-11 VITALS — BP 114/70 | HR 63 | Ht 74.0 in | Wt 192.4 lb

## 2016-10-11 DIAGNOSIS — R55 Syncope and collapse: Secondary | ICD-10-CM

## 2016-10-11 DIAGNOSIS — I5022 Chronic systolic (congestive) heart failure: Secondary | ICD-10-CM

## 2016-10-11 DIAGNOSIS — Z5181 Encounter for therapeutic drug level monitoring: Secondary | ICD-10-CM

## 2016-10-11 DIAGNOSIS — I10 Essential (primary) hypertension: Secondary | ICD-10-CM

## 2016-10-11 DIAGNOSIS — I483 Typical atrial flutter: Secondary | ICD-10-CM

## 2016-10-11 LAB — POCT INR: INR: 2.4

## 2016-10-11 NOTE — Patient Instructions (Signed)
Your physician recommends that you continue on your current medications as directed. Please refer to the Current Medication list given to you today.  Your physician wants you to follow-up in: August, 2018 with Dr. Tenny Craw.  You will receive a reminder letter in the mail two months in advance. If you don't receive a letter, please call our office to schedule the follow-up appointment.  Call [employer] Patient Assistance [phone] to see if you qualify for help paying for Entresto.

## 2016-10-22 ENCOUNTER — Encounter: Payer: Self-pay | Admitting: Internal Medicine

## 2016-10-22 ENCOUNTER — Ambulatory Visit (INDEPENDENT_AMBULATORY_CARE_PROVIDER_SITE_OTHER): Payer: Self-pay | Admitting: Internal Medicine

## 2016-10-22 VITALS — BP 128/84 | HR 103 | Ht 74.0 in | Wt 190.0 lb

## 2016-10-22 DIAGNOSIS — F17210 Nicotine dependence, cigarettes, uncomplicated: Secondary | ICD-10-CM

## 2016-10-22 DIAGNOSIS — J449 Chronic obstructive pulmonary disease, unspecified: Secondary | ICD-10-CM

## 2016-10-22 NOTE — Assessment & Plan Note (Signed)
>   3 min Discussed the risks and costs (both direct and indirect)  of smoking relative to the benefits of quitting but patient unwilling to commit at this point to a specific quit date.    Although I don't endorse regular use of e cigs/ many pts find them helpful; however, I emphasized they should be considered a "one-way bridge" off all tobacco products.  

## 2016-10-22 NOTE — Assessment & Plan Note (Addendum)
02/29/2016  After extensive coaching HFA effectiveness =    75% > continue symbicort  - Spirometry 05/28/2016  FEV1 1.48 (40%)  Ratio 46  p am symbicort 160 2bid  - 06/26/2016  After extensive coaching HFA effectiveness =    90% > bevespi 2 bid and change coreg to bisoprolol 5 mg daily > not done as of 07/24/2016 > at ov 10/22/2016 off coreg and all inhalers and much better sob but still some cough on entresto maint - Spirometry 10/22/2016  FEV1 1.24 (35%)  Ratio 48 - 10/22/2016  Walked RA x 3 laps @ 185 ft each stopped due to  End of study moderate pace, no desat   I had an extended final summary discussion with the patient reviewing all relevant studies completed to date and  lasting 15 to 20 minutes of a 25 minute visit on the following issues:   Informed the patient there was  no medication on the market that has proven to alter the curve/ its downward trajectory  or the likelihood of progression of their disease(unlike other chronic medical conditions such as atheroclerosis where we do think we can change the natural hx with risk reducing meds)    Therefore stopping smoking and maintaining abstinence is the most important aspect of care, not choice of inhalers or for that matter, doctors.    Treatment for copd is only for symptoms and all he really has is a cough which is either related to CB from smoking or entresto or both but for now is not a major problem and of the two I'd rather him obviously stop smoking than stop the Entresto which seems to be helping a lot  Each maintenance medication was reviewed in detail including most importantly the difference between maintenance and as needed and under what circumstances the prns are to be used.  Please see AVS for specific  Instructions which are unique to this visit and I personally typed out  which were reviewed in detail in writing with the patient and a copy provided.

## 2016-10-22 NOTE — Progress Notes (Signed)
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 He also reports dyspnea on exertion. In addition, he reports epigastric abdominal pain, which may radiate to the lower portion of his sternum versus chest pain . He denies any lower extremity edema. Admits to salt rich food ingestion .The symptoms are similar to those of one week ago, at which time, workup was suspicious for acute exacerbation of systolic heart failure. She was started on Cozaar, metoprolol and Lasix 20 mg daily at the time, and was recommended to see cardiology, which he failed to do so. His cardiologist is Dr. Dietrich Pates, last seen in December 2016.the patient continues to smoke 1 pack a day of cigarettes  Discharge Diagnoses:  Acute respiratory failure secondary to CHF exacerbation -O2 sats upon admission 89% -CXR unremarkable for infection -Responded well to IVlasix-->po lasix -Weaned off of O2-SpO2 95% on room air  Acute combined systolic/diastolic heart failure -BNP 2056.8 -Echocardiogram 09/20/2014 showed EF 40-45% -Transition to by mouth Lasix 20 mg daily -Echocardiogram 01/10/2016: EF 35-40%, diastolic dysfunction --AST 147, ALT 110 upon admission -Currently trending downward -hepatitis panel unremarkable -Patient denies drug/alcohol  use -switch metoprolol-->coreg -continue losartan  Essential Hypertension -Has not been taking home medications for over a year due to lack of insurance and monetary funds -Restarted on losartan, Lasix --switch metoprolol-->coreg 6.25 mg bid  CAD -Currently no chest pain -Continue coreg, losartan, ASA 81 mg daily -LDL 80  Tobacco Abuse -Smoking cessation discussed -Continue nicotine patch  Moderate malnutrition -Nutrition consult   History of Present Illness  02/29/2016 1st [facility] office visit/ Leroi Haque  symb 160 2bid/ bid saba (not prn)  Chief Complaint  Patient presents with  . Pulmonary Consult    Pt referred by Tereso Newcomer, PA, for cough, hemoptysis, and emphysema. Pt c/o hemoptysis x 3 weeks, pt states this occurs daily but has been improving. Pt c/o increase in SOB with activity and rest.   acute  onset epistaxis/ hemoptyis end of Auguest > ER eval 02/20/16 dx RLL pna by CTa chest > rx levaquin/ prednisone and minimal hemoptysis now  Doe = MMRC2 = can't walk a nl pace on a flat grade s sob but does fine slow and flat eg [employer] shopping rec Plan A = Automatic =  Symbicort 160 Take 2 puffs first thing in am and then another 2 puffs about 12 hours later.  Work on inhaler technique: Plan B = Backup Only use your albuterol as a rescue medication\ Plan C = Crisis - only use your albuterol nebulizer if you first try Plan B and it fails to help > ok to use the nebulizer up to every 4 hours but if start needing it regularly call for immediate appointment  The key is to stop smoking completely before smoking completely stops you!  Please schedule a follow up office visit  in 2 weeks, sooner if needed with cxr on return > did not return   Admit date: 05/04/2016 Discharge date: 05/09/2016    DISCHARGE DIAGNOSES:  Principal Problem:   Hemoptysis Active Problems:   Community acquired pneumonia of right lower lobe of lung (HCC) - - FOB RLL tbbx ATYPICAL PNEUMOCYTES  PROLIFERATION/ LLL BAL 05/08/16    Hypertension   COPD with acute exacerbation (HCC)   Nonischemic dilated cardiomyopathy (HCC)   Cigarette smoker   Chronic systolic CHF (congestive heart failure) (HCC)   06/03/16 to ER> only better while on neb    06/26/2016  f/u ov/Kasim Mccorkle re:  GOLD III copd / maint symb 160 / saba  Chief Complaint  Patient presents with  . Follow-up    pt had CT today. pt states breathing has worsen since last OV. pt c/o increased sob with exertion, prod cough with white mucus, wheezing & chest tightness,  breathing was worse w/in 2 days of last ov / then ran out of lasix one week ago and swelling started to get worse Also worse orthopnea ? Related to leg swelling in terms of timing but pt not sure  Doe now = MMRC3 = can't walk 100 yards even at a slow pace at a flat grade s stopping due to sob  rec Bisoprolol 5 mg one daily in place of the twice daily corevidol and resume your lasix and potassium  Plan A = Automatic = stop symbicort and start bevespi Take 2 puffs first thing in am and then another 2 puffs about 12 hours later.  Plan B = Backup Only use your albuterol as a rescue medication The key is to stop smoking completely before smoking completely stops you!  Please schedule a follow up office visit in 4 weeks, sooner if needed bring all meds     07/24/2016  f/u ov/Cherlyn Syring re:  Copd/ chf on bevespi 2 bid and prn saba but overusing at baseline and still smoking  Chief Complaint  Patient presents with  . Follow-up    Pt states breathing continues to worsen. He is now using o2 24/7. He c/o swelling in his legs and abdomen. He also c/o nose bleeds recently. He is not coughing much. He is using ventolin HFA 4 x daily on average.   rec Keep appt with your heart doctors and let them know I recommended a  substitute for corevidol due to your breathing problems The key is to stop smoking completely before smoking completely stops you!  Only use your albuterol as a rescue  medication      10/22/2016  f/u ov/Lehman Whiteley re:  Copd III/ still smoking/ once a week 02 off coreg and on bisoprolol  Chief Complaint  Patient presents with  . Follow-up    Pt states his breathing seems better since the last visit. He is not using his albuterol inhaler and rarely uses o2.   about a month since any inhaler at all  Cough x a month worse in am's with white mucus not worse in winter  but s   purulent sputum or mucus plugs or hemoptysis   Doe -  MMRC1 = can walk nl pace, flat grade, can't hurry or go uphills or steps s sob    No obvious day to day or daytime variability or assoc  cp or chest tightness, subjective wheeze or overt sinus or hb symptoms. No unusual exp hx or h/o childhood pna/ asthma or knowledge of premature birth.  Sleeping ok without nocturnal  exacerbation  of respiratory  c/o's or need for noct saba. Also denies any obvious fluctuation of symptoms with weather or environmental changes or other aggravating or alleviating factors except as outlined above   Current Medications, Allergies, Complete Past Medical History, Past Surgical History, Family History, and Social History were reviewed in [employer] record.  ROS  The following are not active complaints unless bolded sore throat, dysphagia, dental problems, itching, sneezing,  nasal congestion or excess/ purulent secretions, ear ache,   fever, chills, sweats, unintended wt loss, classically pleuritic or exertional cp, hemoptysis,  orthopnea pnd or leg swelling, presyncope, palpitations, abdominal pain, anorexia, nausea, vomiting, diarrhea  or change in bowel or bladder habits, change in stools or urine, dysuria,hematuria,  rash, arthralgias, visual complaints, headache, numbness, weakness or ataxia or problems with walking or coordination,  change in mood/affect or memory.                Objective:   Physical Exam  amb bm nad > stated age  .10/22/2016      190  07/24/2016        209    06/26/2016        213  05/28/2016      194   02/29/16 177 lb 12.8 oz (80.6 kg)  02/24/16 182 lb 12.8 oz (82.9 kg)  02/20/16 175 lb (79.4 kg)    Vital signs reviewed -  - Note on arrival 02 sats  98% on RA      HEENT: nl   oropharynx. Nl external ear canals without cough reflex -  Poor dentition/ moderate bilateral non-specific turbinate edema     NECK :  without JVD/Nodes/TM/ nl carotid upstrokes bilaterally   LUNGS: no acc muscle use,  slt barrel  contour chest  With distant bs but no significant wheeze/ rhonchi   CV:  RRR  no s3 or murmur or increase in P2, No sign  lower ext edema   ABD:  soft and nontender with end inspiratory hoover's in the supine position. No bruits or organomegaly, bowel sounds nl  MS:  Nl gait/ ext warm without deformities, calf tenderness, cyanosis or clubbing No obvious joint restrictions   SKIN: warm and dry without lesions    NEURO:  alert, approp, nl sensorium with  no motor deficits         I personally reviewed images and agree with radiology impression as follows:  CXR:   07/01/16 Emphysema.  No focal consolidation or pneumothorax. Stable mild elevation of the right hemidiaphragm. Stable cardiomegaly.  No congestive changes or interstitial edema.          Assessment & Plan:

## 2016-10-22 NOTE — Assessment & Plan Note (Signed)
>   3 m  Says cutting down but not willing or able to commit to quit at this point > Follow up per Primary Care planned

## 2016-10-22 NOTE — Patient Instructions (Addendum)
Only use your albuterol as a rescue medication to be used if you can't catch your breath by resting or doing a relaxed purse lip breathing pattern.  - The less you use it, the better it will work when you need it. - Ok to use up to 2 puffs  every 4 hours if you must but call for immediate appointment if use goes up over your usual need - Don't leave home without it !!  (think of it like the spare tire for your car)    The key is to stop smoking completely before smoking completely stops you!    If you are satisfied with your treatment plan,  let your doctor know and he/she can either refill your medications or you can return here when your prescription runs out.     If in any way you are not 100% satisfied,  please tell us.  If 100% better, tell your friends!  Pulmonary follow up is as needed

## 2016-11-20 ENCOUNTER — Other Ambulatory Visit: Payer: Self-pay

## 2016-11-20 ENCOUNTER — Ambulatory Visit (HOSPITAL_COMMUNITY)
Admission: RE | Admit: 2016-11-20 | Discharge: 2016-11-20 | Disposition: A | Discharge status: Home / Self Care | Payer: Self-pay | Source: Ambulatory Visit | Attending: Family Medicine | Admitting: Family Medicine

## 2016-11-20 ENCOUNTER — Encounter: Payer: Self-pay | Admitting: Family Medicine

## 2016-11-20 ENCOUNTER — Ambulatory Visit: Payer: Self-pay | Attending: Family Medicine | Admitting: Family Medicine

## 2016-11-20 VITALS — BP 107/73 | HR 84 | Temp 98.3°F | Resp 18 | Ht 75.0 in | Wt 188.0 lb

## 2016-11-20 DIAGNOSIS — I10 Essential (primary) hypertension: Secondary | ICD-10-CM

## 2016-11-20 DIAGNOSIS — R9431 Abnormal electrocardiogram [ECG] [EKG]: Secondary | ICD-10-CM | POA: Insufficient documentation

## 2016-11-20 DIAGNOSIS — Z7901 Long term (current) use of anticoagulants: Secondary | ICD-10-CM | POA: Insufficient documentation

## 2016-11-20 DIAGNOSIS — I110 Hypertensive heart disease with heart failure: Secondary | ICD-10-CM | POA: Insufficient documentation

## 2016-11-20 DIAGNOSIS — I2510 Atherosclerotic heart disease of native coronary artery without angina pectoris: Secondary | ICD-10-CM | POA: Insufficient documentation

## 2016-11-20 DIAGNOSIS — Z8673 Personal history of transient ischemic attack (TIA), and cerebral infarction without residual deficits: Secondary | ICD-10-CM | POA: Insufficient documentation

## 2016-11-20 DIAGNOSIS — R0789 Other chest pain: Secondary | ICD-10-CM | POA: Insufficient documentation

## 2016-11-20 DIAGNOSIS — I420 Dilated cardiomyopathy: Secondary | ICD-10-CM

## 2016-11-20 DIAGNOSIS — J449 Chronic obstructive pulmonary disease, unspecified: Secondary | ICD-10-CM

## 2016-11-20 DIAGNOSIS — F17210 Nicotine dependence, cigarettes, uncomplicated: Secondary | ICD-10-CM | POA: Insufficient documentation

## 2016-11-20 DIAGNOSIS — I5042 Chronic combined systolic (congestive) and diastolic (congestive) heart failure: Secondary | ICD-10-CM | POA: Insufficient documentation

## 2016-11-20 DIAGNOSIS — I4892 Unspecified atrial flutter: Secondary | ICD-10-CM

## 2016-11-20 MED ORDER — ALBUTEROL SULFATE HFA 108 (90 BASE) MCG/ACT IN AERS
2.0000 | INHALATION_SPRAY | Freq: Four times a day (QID) | RESPIRATORY_TRACT | 5 refills | Status: DC | PRN
Start: 2016-11-20 — End: 2018-05-15

## 2016-11-20 MED FILL — $VENTOLIN HFA 18G INHALER: 108 (90 BAS | 25 days supply | Qty: 18 | Fill #0

## 2016-11-20 NOTE — Patient Instructions (Signed)
Steps to Quit Smoking Smoking tobacco can be bad for your health. It can also affect almost every organ in your body. Smoking puts you and people around you at risk for many serious long-lasting (chronic) diseases. Quitting smoking is hard, but it is one of the best things that you can do for your health. It is never too late to quit. What are the benefits of quitting smoking? When you quit smoking, you lower your risk for getting serious diseases and conditions. They can include:  Lung cancer or lung disease.  Heart disease.  Stroke.  Heart attack.  Not being able to have children (infertility).  Weak bones (osteoporosis) and broken bones (fractures).  If you have coughing, wheezing, and shortness of breath, those symptoms may get better when you quit. You may also get sick less often. If you are pregnant, quitting smoking can help to lower your chances of having a baby of low birth weight. What can I do to help me quit smoking? Talk with your doctor about what can help you quit smoking. Some things you can do (strategies) include:  Quitting smoking totally, instead of slowly cutting back how much you smoke over a period of time.  Going to in-person counseling. You are more likely to quit if you go to many counseling sessions.  Using resources and support systems, such as: ? Online chats with a counselor. ? Phone quitlines. ? Printed self-help materials. ? Support groups or group counseling. ? Text messaging programs. ? Mobile phone apps or applications.  Taking medicines. Some of these medicines may have nicotine in them. If you are pregnant or breastfeeding, do not take any medicines to quit smoking unless your doctor says it is okay. Talk with your doctor about counseling or other things that can help you.  Talk with your doctor about using more than one strategy at the same time, such as taking medicines while you are also going to in-person counseling. This can help make  quitting easier. What things can I do to make it easier to quit? Quitting smoking might feel very hard at first, but there is a lot that you can do to make it easier. Take these steps:  Talk to your family and friends. Ask them to support and encourage you.  Call phone quitlines, reach out to support groups, or work with a counselor.  Ask people who smoke to not smoke around you.  Avoid places that make you want (trigger) to smoke, such as: ? Bars. ? Parties. ? Smoke-break areas at work.  Spend time with people who do not smoke.  Lower the stress in your life. Stress can make you want to smoke. Try these things to help your stress: ? Getting regular exercise. ? Deep-breathing exercises. ? Yoga. ? Meditating. ? Doing a body scan. To do this, close your eyes, focus on one area of your body at a time from head to toe, and notice which parts of your body are tense. Try to relax the muscles in those areas.  Download or buy apps on your mobile phone or tablet that can help you stick to your quit plan. There are many free apps, such as QuitGuide from the CDC (Centers for Disease Control and Prevention). You can find more support from smokefree.gov and other websites.  This information is not intended to replace advice given to you by your health care provider. Make sure you discuss any questions you have with your health care provider. Document Released: 03/24/2009 Document   Revised: 01/24/2016 Document Reviewed: 10/12/2014 Elsevier Interactive Patient Education  2018 [employer] Inc.  

## 2016-11-20 NOTE — Progress Notes (Signed)
Patient is here for FU  Patient complains of a nagging chest pain being present for the past week.  Patient request refills on all medications.  Patient has taken medication today. Patient has eaten today.

## 2016-11-20 NOTE — Progress Notes (Signed)
Subjective:  Patient ID: Jared [name], male    DOB: January 16, 1956  Age: 61 y.o. MRN: [id number]  CC: Follow-up   HPI Jared [name] is a 61 year old male with a history of hypertension, COPD, tobacco abuse, NICM , CHF (EF 30-35% from 2-D echo 09/2016), atrial flutter (currently on anticoagulation with Coumadin) who comes in today for a follow-up visit.  He has been compliant with his medications but does not have his bottles with him and I am unable to verify what he is actually taking. He complains of right-sided chest pains rated at a 1/10 which he states is constant and unrelated to activity. Endorses shortness of breath only on moderate exertion but has no pedal edema or orthopnea. Seen by cardiology and EP last month, he will need financial assistance prior to ICD implantation.  He continues to smoke 3-4 cigarettes per day Last visit to pulmonary was on 07/24/16 and he remains on Proventil inhaler, Bevespi. He does have a baseline shortness of breath and remains 2 L of oxygen at rest; continues to smoke.  Recently seen by [name] due to elevated LFTs which were thought to be secondary to passive hepatic congestion as a result of dilated cardiomyopathy exacerbated by alcoholism.  Past Medical History:  Diagnosis Date  . CAD (coronary artery disease)    a. LHC 5/12:  LAD 20, pLCx 20, pRCA 40, dRCA 40, EF 35%, diff HK  //  b. Myoview 4/16: Overall Impression: High risk stress nuclear study There is no evidence of ischemia. There is severe LV dysfunction. LV Ejection Fraction: 30%. LV Wall Motion: There is global LV hypokinesis.   Marland Kitchen CAP (community acquired pneumonia) 09/2013  . Chronic combined systolic and diastolic CHF (congestive heart failure) (HCC)    a. Echo 4/16:Mild LVH, EF 40-45%, diffuse HK //  b. Echo 8/17: EF 35-40%, diffuse HK, diastolic dysfunction, aortic sclerosis, trivial MR, moderate LAE, normal RVSF, moderate RAE, mild TR, PASP 42 mmHg // c. Echo 4/18: Mild concentric  LVH, EF 30-35, normal wall motion, grade 1 diastolic dysfunction, PASP 49  . Cluster headache    "hx; haven't had one in awhile" (01/09/2016)  . COPD (chronic obstructive pulmonary disease) (HCC)    Jared [name] 01/09/2016  . History of CVA (cerebrovascular accident)   . Hypertension   . NICM (nonischemic cardiomyopathy) (HCC)   . Tobacco abuse     Past Surgical History:  Procedure Laterality Date  . CARDIAC CATHETERIZATION  10/2010   LM normal, LAD with 20% irregularities, LCX with 20%, RCA with 40% prox and 40% distal - EF of 35%  . COLONOSCOPY W/ BIOPSIES AND POLYPECTOMY    . EXCISION MASS HEAD    . VIDEO BRONCHOSCOPY Bilateral 05/08/2016   Procedure: VIDEO BRONCHOSCOPY WITH FLUORO;  Surgeon: Oretha Milch, MD;  Location: [facility] ENDOSCOPY;  Service: Cardiopulmonary;  Laterality: Bilateral;    Allergies  Allergen Reactions  . Lisinopril Cough     Outpatient Medications Prior to Visit  Medication Sig Dispense Refill  . bisoprolol (ZEBETA) 5 MG tablet Take 1 tablet (5 mg total) by mouth daily. 30 tablet 2  . furosemide (LASIX) 40 MG tablet Take 1.5 tablets (60 mg total) by mouth daily. (Patient taking differently: Take 40 mg by mouth daily. ) 135 tablet 3  . Glycopyrrolate-Formoterol (BEVESPI AEROSPHERE) 9-4.8 MCG/ACT AERO Inhale 2 puffs into the lungs 2 (two) times daily. 1 Inhaler 5  . hyoscyamine (LEVSIN SL) 0.125 MG SL tablet Place 1 tablet (0.125 mg total) under  the tongue every 8 (eight) hours as needed. 30 tablet 1  . losartan (COZAAR) 25 MG tablet Take 1 tablet by mouth daily.  3  . OXYGEN 2lpm when needed per pt    . sacubitril-valsartan (ENTRESTO) 24-26 MG Take 1 tablet by mouth 2 (two) times daily. 180 tablet 3  . warfarin (COUMADIN) 7.5 MG tablet Take as directed by coumadin clinic 40 tablet 3  . albuterol (PROVENTIL HFA;VENTOLIN HFA) 108 (90 Base) MCG/ACT inhaler Inhale 2 puffs into the lungs every 6 (six) hours as needed for wheezing or shortness of breath. 1 Inhaler 2   No  facility-administered medications prior to visit.     ROS Review of Systems  Constitutional: Negative for activity change and appetite change.  HENT: Negative for sinus pressure and sore throat.   Eyes: Negative for visual disturbance.  Respiratory: Negative for cough, chest tightness and shortness of breath.   Cardiovascular: Positive for chest pain. Negative for leg swelling.  Gastrointestinal: Negative for abdominal distention, abdominal pain, constipation and diarrhea.  Endocrine: Negative.   Genitourinary: Negative for dysuria.  Musculoskeletal: Negative for joint swelling and myalgias.  Skin: Negative for rash.  Allergic/Immunologic: Negative.   Neurological: Negative for weakness, light-headedness and numbness.  Psychiatric/Behavioral: Negative for dysphoric mood and suicidal ideas.    Objective:  BP 107/73 (BP Location: Left Arm, Patient Position: Sitting, Cuff Size: Normal)   Pulse 84   Temp 98.3 F (36.8 C) (Oral)   Resp 18   Ht 6\' 3"  (1.905 m)   Wt 188 lb (85.3 kg)   SpO2 96%   BMI 23.50 kg/m   BP/Weight 11/20/2016 10/22/2016 10/11/2016  Systolic BP 107 128 114  Diastolic BP 73 84 70  Wt. (Lbs) 188 190 192.4  BMI 23.5 24.39 24.7      Physical Exam  Constitutional: He is oriented to person, place, and time. He appears well-developed and well-nourished.  Cardiovascular: Normal rate, normal heart sounds and intact distal pulses.   No murmur heard. Pulmonary/Chest: Effort normal and breath sounds normal. He has no wheezes. He has no rales. He exhibits no tenderness.  Abdominal: Soft. Bowel sounds are normal. He exhibits no distension and no mass. There is no tenderness.  Musculoskeletal: Normal range of motion.  Neurological: He is alert and oriented to person, place, and time.  Skin: Skin is warm and dry.  Psychiatric: He has a normal mood and affect.     Assessment & Plan:   1. Nonischemic dilated cardiomyopathy (HCC) EF of 30-35%, LVH, moderately to  severely reduced systolic function, grade 1 diastolic dysfunction from 2-D echo of 09/2016 Euvolemic Continue Lasix, Entresto  2. Essential hypertension Controlled He does not have his medication bottles with him and I am unable to verify what he is actually taking  3. COPD GOLD III/still smoking  No acute exacerbation He continues to smoke despite counseling on cessation Continue Bevespi Keep appointment with pulmonary - albuterol (PROVENTIL HFA;VENTOLIN HFA) 108 (90 Base) MCG/ACT inhaler; Inhale 2 puffs into the lungs every 6 (six) hours as needed for wheezing or shortness of breath.  Dispense: 1 Inhaler; Refill: 5  4. Atrial flutter, unspecified type (HCC) Last INR was 2.4 Currently anticoagulated with Coumadin - followed by Coumadin clinic Beta blocker for rate control Will need to verify once he has his medications with him if he is on metoprolol or Bisoprolol  5. Other chest pain EKG today is unchanged from last month -sinus arrhythmia, inferior infarct, left axis deviation.  Will need  labs at next visit Meds ordered this encounter  Medications  . albuterol (PROVENTIL HFA;VENTOLIN HFA) 108 (90 Base) MCG/ACT inhaler    Sig: Inhale 2 puffs into the lungs every 6 (six) hours as needed for wheezing or shortness of breath.    Dispense:  1 Inhaler    Refill:  5    Follow-up: Return in about 3 months (around 02/20/2017) for follow up of chronic medical conditions.   This note has been created with Education officer, environmental. Any transcriptional errors are unintentional.     Jaclyn Shaggy MD

## 2016-11-21 ENCOUNTER — Ambulatory Visit
Admission: RE | Admit: 2016-11-21 | Discharge: 2016-11-21 | Disposition: A | Discharge status: Home / Self Care | Payer: No Typology Code available for payment source | Source: Ambulatory Visit | Attending: Physician Assistant | Admitting: Physician Assistant

## 2016-11-21 ENCOUNTER — Telehealth: Payer: Self-pay | Admitting: *Deleted

## 2016-11-21 ENCOUNTER — Ambulatory Visit (INDEPENDENT_AMBULATORY_CARE_PROVIDER_SITE_OTHER): Payer: Self-pay | Admitting: *Deleted

## 2016-11-21 ENCOUNTER — Encounter: Payer: Self-pay | Admitting: Physician Assistant

## 2016-11-21 ENCOUNTER — Ambulatory Visit (INDEPENDENT_AMBULATORY_CARE_PROVIDER_SITE_OTHER): Payer: Self-pay | Admitting: Physician Assistant

## 2016-11-21 VITALS — BP 130/60 | HR 83 | Ht 75.0 in | Wt 188.0 lb

## 2016-11-21 DIAGNOSIS — I2510 Atherosclerotic heart disease of native coronary artery without angina pectoris: Secondary | ICD-10-CM

## 2016-11-21 DIAGNOSIS — R0782 Intercostal pain: Secondary | ICD-10-CM

## 2016-11-21 DIAGNOSIS — I5042 Chronic combined systolic (congestive) and diastolic (congestive) heart failure: Secondary | ICD-10-CM

## 2016-11-21 DIAGNOSIS — I4892 Unspecified atrial flutter: Secondary | ICD-10-CM

## 2016-11-21 DIAGNOSIS — I483 Typical atrial flutter: Secondary | ICD-10-CM

## 2016-11-21 DIAGNOSIS — I10 Essential (primary) hypertension: Secondary | ICD-10-CM

## 2016-11-21 DIAGNOSIS — Z5181 Encounter for therapeutic drug level monitoring: Secondary | ICD-10-CM

## 2016-11-21 DIAGNOSIS — I420 Dilated cardiomyopathy: Secondary | ICD-10-CM

## 2016-11-21 LAB — POCT INR: INR: 1.1

## 2016-11-21 MED ORDER — BISOPROLOL FUMARATE 5 MG PO TABS: 5.0000 mg | ORAL_TABLET | Freq: Every day | ORAL | 3 refills | Status: DC

## 2016-11-21 MED ORDER — FUROSEMIDE 40 MG PO TABS: 40.0000 mg | ORAL_TABLET | Freq: Every day | ORAL | 3 refills | Status: DC

## 2016-11-21 MED ORDER — LOSARTAN POTASSIUM 25 MG PO TABS: 25.0000 mg | ORAL_TABLET | Freq: Every day | ORAL | 3 refills | Status: DC

## 2016-11-21 MED ORDER — WARFARIN SODIUM 7.5 MG PO TABS: ORAL_TABLET | ORAL | 0 refills | Status: DC

## 2016-11-21 MED FILL — BISOPROLOL FUMARATE 5 MG TA: 5 | 30 days supply | Qty: 30 | Fill #0

## 2016-11-21 MED FILL — WARFARIN SODIUM 7.5 MG TAB: 7.5 | 30 days supply | Qty: 35 | Fill #0

## 2016-11-21 MED FILL — ?FUROSEMIDE 40 MG TABLET: 40 | 30 days supply | Qty: 30 | Fill #0

## 2016-11-21 NOTE — Patient Instructions (Addendum)
Medication Instructions:  1. STOP LOSARTAN  2. CONTINUE ALL OTHER MEDICATIONS INCLUDING ENTRESTO  3. You can take Tylenol 500 mg to 1000 mg every 8 hours as needed for pain.    Labwork: None   Testing/Procedures: Go to [facility] for a [facility] today.   Follow-Up: 01/31/17 @ 12 NOON WITH DR. ROSS  Return sooner if your chest pain worsens or changes.  Any Other Special Instructions Will Be Listed Below (If Applicable). I think your chest pain is from your chest wall. When you have COPD, you can rupture something in your lung (called a bleb).  I am having you do a chest X-ray to follow up on this. You can take Tylenol as needed as noted above. You can put heat on the area that hurts 1-2 times a day to make it feel better. I do not think you need a stress test.  If your chest pain changes or worsens, call us.  We will need to see you sooner.   If you need a refill on your cardiac medications before your next appointment, please call your pharmacy.

## 2016-11-21 NOTE — Telephone Encounter (Signed)
-----   Message from Beatrice Lecher, [place] sent at 11/21/2016  3:30 PM EDT ----- Please call the patient The chest X-ray does not show any acute changes. Continue with current treatment plan. Tereso Newcomer, PA-C   11/21/2016 3:29 PM

## 2016-11-21 NOTE — Progress Notes (Signed)
Cardiology Office Note:    Date:  11/21/2016   ID:  Jared [name], DOB [birth year], MRN [id number]  PCP:  Jared Shaggy, MD  Cardiologist:Dr. Dietrich [name] Electrophysiologist: Dr. Loman [name]  Pulmonology: Dr. Sherene [name]  Referring MD: Jared Shaggy, MD   Chief Complaint  Patient presents with  . Chest Pain    History of Present Illness:    Jared [name] is a 61 y.o. 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  He was admitted in 06/2016 with acute CHF in the setting of atrial flutter. He converted to NSR after discharge from the hospital. He is on Coumadin for anticoagulation. Abdominal ultrasound in 06/2016 suggested cirrhosis.  However, with diuresis these changes resolved. Therefore, this was felt to be related to hepatic congestion. He had an episode of syncope/near syncope in 2/18. Holter monitor demonstrated NSR, PVCs/PACs and short burst of PAT.  He saw gastroenterology for elevated LFTs with negative workup. His EF has remained < 35% and he was evaluated by Dr. Elberta [name] 5/18 for ICD. Financial assistance is being arranged in order to proceed with ICD implantation. Last seen by Dr. Tenny [name] 5/18.  CHADS2-VASc=5 (CHF, CVA, HTN, vascular dz).   Jared [name] returns for the evaluation of chest pain.  He started having R upper chest pain last week after he was coughing.  He denies any significant change in his cough. He denies fevers.  He denies pleuritic chest pain or chest pain with lying supine.  His chest pain has been fairly continuous.  He denies any worse symptoms with activity.  He denies any alleviating factors.  He denies significant change in his shortness of breath.  He denies orthopnea, paroxysmal nocturnal dyspnea, edema.  He denies syncope.  He is still smoking a few cigarettes a day.   Prior CV studies:   The following  studies were reviewed today:  Echo 10/03/16 Mild concentric LVH, EF 30-35, normal wall motion, grade 1 diastolic dysfunction, PASP 49  Holter 3/18 Sinus rhythm  64 to 117 bpm  Average HR 83 bpm   Occasoinal PVCs,  PACs Short burst of PAT NO significant pauses No symtpoms recorded     Echo 07/04/16  Moderate LVH, EF 30-35, diffuse HK aortic sclerosis, trivial MR, moderate LAE, mildly reduced RVSF, moderate RAE, mild TR, PASP 35, small pericardial effusion   Echo 01/10/16 EF 35-40%, diffuse HK, diastolic dysfunction, aortic sclerosis, trivial MR, moderate LAE, normal RVSF, moderate RAE, mild TR, PASP 42 mmHg   Myoview 4/16 Overall Impression:  High risk stress nuclear study There is no evidence of ischemia.  There is severe LV dysfunction. LV Ejection Fraction: 30%.  LV Wall Motion:  There is global LV hypokinesis.     Echo 4/16 Mild LVH, EF 40-45%, diffuse HK   Cardiac cath (10/11/2010)  Done for CP  [facility])   Known cardiomyopathy prior. LM normal  LAD 20% irregularities LCX 20% proximal RCA:  Dominant; 40% proximal, 40% distal LVEF 35% with diffuse hypokinesis.  Past Medical History:  Diagnosis Date  . CAD (coronary artery disease)    a. LHC 5/12:  LAD 20, pLCx 20, pRCA 40, dRCA 40, EF 35%, diff HK  //  b. Myoview 4/16: Overall Impression: High risk stress nuclear study There is no evidence of ischemia. There is severe LV dysfunction. LV Ejection Fraction: 30%. LV Wall Motion: There is  global LV hypokinesis.   Marland Kitchen CAP (community acquired pneumonia) 09/2013  . Chronic combined systolic and diastolic CHF (congestive heart failure) (HCC)    a. Echo 4/16:Mild LVH, EF 40-45%, diffuse HK //  b. Echo 8/17: EF 35-40%, diffuse HK, diastolic dysfunction, aortic sclerosis, trivial MR, moderate LAE, normal RVSF, moderate RAE, mild TR, PASP 42 mmHg // c. Echo 4/18: Mild concentric LVH, EF 30-35, normal wall motion, grade 1 diastolic dysfunction, PASP 49  . Cluster  headache    "hx; haven't had one in awhile" (01/09/2016)  . COPD (chronic obstructive pulmonary disease) (HCC)    Jared [name] 01/09/2016  . History of CVA (cerebrovascular accident)   . Hypertension   . NICM (nonischemic cardiomyopathy) (HCC)   . Tobacco abuse     Past Surgical History:  Procedure Laterality Date  . CARDIAC CATHETERIZATION  10/2010   LM normal, LAD with 20% irregularities, LCX with 20%, RCA with 40% prox and 40% distal - EF of 35%  . COLONOSCOPY W/ BIOPSIES AND POLYPECTOMY    . EXCISION MASS HEAD    . VIDEO BRONCHOSCOPY Bilateral 05/08/2016   Procedure: VIDEO BRONCHOSCOPY WITH FLUORO;  Surgeon: Oretha Milch, MD;  Location: [facility] ENDOSCOPY;  Service: Cardiopulmonary;  Laterality: Bilateral;    Current Medications: Current Meds  Medication Sig  . albuterol (PROVENTIL HFA;VENTOLIN HFA) 108 (90 Base) MCG/ACT inhaler Inhale 2 puffs into the lungs every 6 (six) hours as needed for wheezing or shortness of breath.  . bisoprolol (ZEBETA) 5 MG tablet Take 1 tablet (5 mg total) by mouth daily.  . furosemide (LASIX) 40 MG tablet Take 1 tablet (40 mg total) by mouth daily.  . Glycopyrrolate-Formoterol (BEVESPI AEROSPHERE) 9-4.8 MCG/ACT AERO Inhale 2 puffs into the lungs 2 (two) times daily.  . hyoscyamine (LEVSIN SL) 0.125 MG SL tablet Place 1 tablet (0.125 mg total) under the tongue every 8 (eight) hours as needed.  . OXYGEN 2lpm when needed per pt  . sacubitril-valsartan (ENTRESTO) 24-26 MG Take 1 tablet by mouth 2 (two) times daily.  Marland Kitchen warfarin (COUMADIN) 7.5 MG tablet Take as directed by coumadin clinic  . [DISCONTINUED] losartan (COZAAR) 25 MG tablet Take 1 tablet (25 mg total) by mouth daily.     Allergies:   Lisinopril   Social History   Social History  . Marital status: Married    Spouse name: N/A  . Number of children: 1  . Years of education: N/A   Occupational History  . retired    Social History Main Topics  . Smoking status: Current Every Day Smoker     Packs/day: 0.25    Years: 42.00    Types: Cigarettes  . Smokeless tobacco: Never Used     Comment: 3 cigs daily  . Alcohol use 0.0 oz/week     Comment: last drink was before xmas  . Drug use: No     Comment: "nothing in 20 years"  . Sexual activity: Not Currently   Other Topics Concern  . None   Social History Narrative   unemployed        Family Hx: The patient's family history includes Cancer in his father; Colon cancer in his mother; Diabetes in his brother, mother, and sister; Heart disease in his mother; Liver cancer in his mother.  ROS:   Please see the history of present illness.    Review of Systems  Eyes: Positive for visual disturbance.  Cardiovascular: Positive for chest pain and irregular heartbeat.  Respiratory: Positive for cough, shortness  of breath and wheezing.   Neurological: Positive for headaches.   All other systems reviewed and are negative.   EKGs/Labs/Other Test Reviewed:    EKG:  EKG is not ordered today.  The ekg ordered today demonstrates n/a ECG done by PCP 11/20/16:  NSR, HR 79, LAD, inferior Q waves, PAC, QTc 472 ms, no significant change compared to prior tracing  Recent Labs: 07/03/2016: Magnesium 1.7 07/04/2016: TSH 1.268 07/25/2016: Hemoglobin 13.7; Platelets 144 07/30/2016: ALT 72; Brain Natriuretic Peptide 1,458.6 08/03/2016: NT-Pro BNP 2,541 09/10/2016: BUN 13; Creatinine, Ser 1.06; Potassium 3.6; Sodium 144   Recent Lipid Panel Lab Results  Component Value Date/Time   CHOL 147 01/11/2016 04:46 AM   TRIG 69 01/11/2016 04:46 AM   HDL 53 01/11/2016 04:46 AM   CHOLHDL 2.8 01/11/2016 04:46 AM   LDLCALC 80 01/11/2016 04:46 AM    Physical Exam:    VS:  BP 130/60   Pulse 83   Ht 6\' 3"  (1.905 m)   Wt 188 lb (85.3 kg)   BMI 23.50 kg/m     Wt Readings from Last 3 Encounters:  11/21/16 188 lb (85.3 kg)  11/20/16 188 lb (85.3 kg)  10/22/16 190 lb (86.2 kg)     Physical Exam  Constitutional: He is oriented to person, place, and  time. He appears well-developed and well-nourished. No distress.  HENT:  Head: Normocephalic and atraumatic.  Eyes: No scleral icterus.  Neck: Normal range of motion. No JVD present.  Cardiovascular: Normal rate, regular rhythm, S1 normal and S2 normal.   No murmur heard. Pulmonary/Chest: Effort normal. He has decreased breath sounds. He has no wheezes. He has no rhonchi. He has no rales. He exhibits no tenderness.  Abdominal: Soft. There is no tenderness.  Musculoskeletal: He exhibits no edema.  Neurological: He is alert and oriented to person, place, and time.  Skin: Skin is warm and dry.  Psychiatric: He has a normal mood and affect.    ASSESSMENT:    1. Intercostal pain   2. Chronic combined systolic and diastolic CHF (congestive heart failure) (HCC)   3. Nonischemic dilated cardiomyopathy (HCC)   4. Typical atrial flutter (HCC)   5. Coronary artery disease involving native coronary artery of native heart without angina pectoris   6. Essential hypertension    PLAN:    In order of problems listed above:  1. Intercostal pain -  His chest pain sounds like musculoskeletal pain.  He has mild non-obs CAD by cath in 2012 and a non-ischemic myoview in 2016.  His ECG yesterday was unchanged.  He does not really describe exertional symptoms or symptoms c/w angina.  With his COPD, I question if he ruptured a bleb.  At this point, I do not think that he needs ischemic testing.    -  Conservative management of pain  -  CXR today  -  FU sooner if symptoms change  -  If pneumothorax on CXR, may need earlier FU with Pulmonology.   2. Chronic combined systolic and diastolic CHF (congestive heart failure) (HCC) - Volume stable.  Continue beta-blocker, ARNI.  He is no longer on Spironolactone (I am not sure why). He is still taking Losartan as well.  We have asked him to only take Entresto (not Losartan).    3. Nonischemic dilated cardiomyopathy (HCC) - He was seen by EP for ICD.  I will ask  Dr. Elberta [name] if he has gotten any financial assistance.   4. Typical atrial flutter (HCC) -  Maintaining NSR.  He remains on Coumadin.   5. Coronary artery disease involving native coronary artery of native heart without angina pectoris -  Non-obstructive CAD by LHC in 2012 and non-ischemic Myoview in 2016.  His current symptoms do not sound cardiac in origin. If his symptoms change, we can consider stress testing.,    6. Essential hypertension - The patient's blood pressure is controlled on his current regimen.  Continue current therapy.     Dispo:  Return in about 2 months (around 01/21/2017) for Routine Follow Up, w/ Dr. Tenny [name].   Medication Adjustments/Labs and Tests Ordered: Current medicines are reviewed at length with the patient today.  Concerns regarding medicines are outlined above.  Orders/Tests:  Orders Placed This Encounter  Procedures  . DG Chest 2 View  . EKG 12-Lead   Medication changes: Meds ordered this encounter  Medications  . bisoprolol (ZEBETA) 5 MG tablet    Sig: Take 1 tablet (5 mg total) by mouth daily.    Dispense:  90 tablet    Refill:  3  . furosemide (LASIX) 40 MG tablet    Sig: Take 1 tablet (40 mg total) by mouth daily.    Dispense:  90 tablet    Refill:  3  . DISCONTD: losartan (COZAAR) 25 MG tablet    Sig: Take 1 tablet (25 mg total) by mouth daily.    Dispense:  90 tablet    Refill:  3   Signed, Tereso Newcomer, PA-C  11/21/2016 3:12 PM    [facility] [address] [place], [place], [place]  [address] Phone: [phone]; Fax: [phone]

## 2016-11-21 NOTE — Telephone Encounter (Signed)
Pt has been notified of CXR results by phone with verbal understanding. Pt thanked me for my call today.  

## 2016-11-21 NOTE — Telephone Encounter (Signed)
Called to follow up if patient had obtained insurance or if he completed financial paperwork for assistance through the hospital.  Pt tells me that he has not started this process.  Stressed the importance of getting this started and importance of needing ICD for prevention. Patient verbalized understanding and agreeable to starting process.  Advised I would follow up in several weeks if I have not heard from him.

## 2016-11-29 ENCOUNTER — Ambulatory Visit (INDEPENDENT_AMBULATORY_CARE_PROVIDER_SITE_OTHER): Payer: Self-pay

## 2016-11-29 DIAGNOSIS — I483 Typical atrial flutter: Secondary | ICD-10-CM

## 2016-11-29 DIAGNOSIS — Z5181 Encounter for therapeutic drug level monitoring: Secondary | ICD-10-CM

## 2016-11-29 LAB — POCT INR: INR: 1.8

## 2016-12-13 ENCOUNTER — Ambulatory Visit (INDEPENDENT_AMBULATORY_CARE_PROVIDER_SITE_OTHER): Payer: Self-pay | Admitting: *Deleted

## 2016-12-13 DIAGNOSIS — Z5181 Encounter for therapeutic drug level monitoring: Secondary | ICD-10-CM

## 2016-12-13 DIAGNOSIS — I483 Typical atrial flutter: Secondary | ICD-10-CM

## 2016-12-13 LAB — POCT INR: INR: 2.8

## 2016-12-21 NOTE — Telephone Encounter (Signed)
Followed back up with patient.  He tells me that he has part of the paperwork completed and turned in, but is still working on some of it.

## 2016-12-24 MED FILL — WARFARIN SODIUM 7.5 MG TAB: 7.5 | 30 days supply | Qty: 40 | Fill #1

## 2016-12-24 MED FILL — ?FUROSEMIDE 40 MG TABLET: 40 | 30 days supply | Qty: 30 | Fill #1

## 2016-12-24 MED FILL — BISOPROLOL FUMARATE 5 MG TA: 5 | 30 days supply | Qty: 30 | Fill #1

## 2016-12-27 ENCOUNTER — Encounter (INDEPENDENT_AMBULATORY_CARE_PROVIDER_SITE_OTHER): Payer: Self-pay

## 2016-12-27 ENCOUNTER — Ambulatory Visit (INDEPENDENT_AMBULATORY_CARE_PROVIDER_SITE_OTHER): Payer: Self-pay

## 2016-12-27 DIAGNOSIS — I483 Typical atrial flutter: Secondary | ICD-10-CM

## 2016-12-27 DIAGNOSIS — Z5181 Encounter for therapeutic drug level monitoring: Secondary | ICD-10-CM

## 2016-12-27 LAB — POCT INR: INR: 1.8

## 2017-01-30 MED FILL — WARFARIN SODIUM 7.5 MG TAB: 7.5 | 30 days supply | Qty: 40 | Fill #2

## 2017-01-30 MED FILL — BISOPROLOL FUMARATE 5 MG TA: 5 | 30 days supply | Qty: 30 | Fill #2

## 2017-01-30 NOTE — Progress Notes (Signed)
Cardiology Office Note   Date:  01/31/2017   ID:  Jared [name], DOB [birth year], MRN [id number]  PCP:  Jared Shaggy, MD  Cardiologist:   Dietrich Pates, MD   F/U of systolic CHF   History of Present Illness: Jared [name] is a 61 y.o. male with a history of HTN, tobacco abuse, mild CAD, CVA.  LVEf 45 to 50%  I saw him in clinic in March 2016.  Had some chest tightness  I recomm a Lexiscan myoview  This showed normal perfusion  Echo showed LVEF 40 to 45%.     He was admtted in Jan 2018 with CHF in setting of atrial flutter  Placed on coumadin  Plan for d/c cardioversion I saw the pt in May 2018 He was seen Neta Mends in June 2018 for intercostal CP   Felt to be muscular in origin  Since seen he has done OK  Notes occasional CP in bed  Sharp  Not associated with other activities  Not pleuriitc or positional  Not too active   Still smoking 4 cigs per day    Current Outpatient Prescriptions  Medication Sig Dispense Refill  . albuterol (PROVENTIL HFA;VENTOLIN HFA) 108 (90 Base) MCG/ACT inhaler Inhale 2 puffs into the lungs every 6 (six) hours as needed for wheezing or shortness of breath. 1 Inhaler 5  . bisoprolol (ZEBETA) 5 MG tablet Take 1 tablet (5 mg total) by mouth daily. 90 tablet 3  . furosemide (LASIX) 40 MG tablet Take 1 tablet (40 mg total) by mouth daily. 90 tablet 3  . Glycopyrrolate-Formoterol (BEVESPI AEROSPHERE) 9-4.8 MCG/ACT AERO Inhale 2 puffs into the lungs 2 (two) times daily. 1 Inhaler 5  . hyoscyamine (LEVSIN SL) 0.125 MG SL tablet Place 1 tablet (0.125 mg total) under the tongue every 8 (eight) hours as needed. 30 tablet 1  . OXYGEN 2lpm when needed per pt    . sacubitril-valsartan (ENTRESTO) 24-26 MG Take 1 tablet by mouth 2 (two) times daily. 180 tablet 3  . warfarin (COUMADIN) 7.5 MG tablet Take as directed by coumadin clinic 35 tablet 0   No current facility-administered medications for this visit.     Allergies:   Lisinopril   Past Medical History:    Diagnosis Date  . CAD (coronary artery disease)    a. LHC 5/12:  LAD 20, pLCx 20, pRCA 40, dRCA 40, EF 35%, diff HK  //  b. Myoview 4/16: Overall Impression: High risk stress nuclear study There is no evidence of ischemia. There is severe LV dysfunction. LV Ejection Fraction: 30%. LV Wall Motion: There is global LV hypokinesis.   Marland Kitchen CAP (community acquired pneumonia) 09/2013  . Chronic combined systolic and diastolic CHF (congestive heart failure) (HCC)    a. Echo 4/16:Mild LVH, EF 40-45%, diffuse HK //  b. Echo 8/17: EF 35-40%, diffuse HK, diastolic dysfunction, aortic sclerosis, trivial MR, moderate LAE, normal RVSF, moderate RAE, mild TR, PASP 42 mmHg // c. Echo 4/18: Mild concentric LVH, EF 30-35, normal wall motion, grade 1 diastolic dysfunction, PASP 49  . Cluster headache    "hx; haven't had one in awhile" (01/09/2016)  . COPD (chronic obstructive pulmonary disease) (HCC)    [name] 01/09/2016  . History of CVA (cerebrovascular accident)   . Hypertension   . NICM (nonischemic cardiomyopathy) (HCC)   . Tobacco abuse     Past Surgical History:  Procedure Laterality Date  . CARDIAC CATHETERIZATION  10/2010   LM normal, LAD with 20% irregularities,  LCX with 20%, RCA with 40% prox and 40% distal - EF of 35%  . COLONOSCOPY W/ BIOPSIES AND POLYPECTOMY    . EXCISION MASS HEAD    . VIDEO BRONCHOSCOPY Bilateral 05/08/2016   Procedure: VIDEO BRONCHOSCOPY WITH FLUORO;  Surgeon: Oretha Milch, MD;  Location: [facility] ENDOSCOPY;  Service: Cardiopulmonary;  Laterality: Bilateral;     Social History:  The patient  reports that he has been smoking Cigarettes.  He has a 10.50 pack-year smoking history. He has never used smokeless tobacco. He reports that he drinks alcohol. He reports that he does not use drugs.   Family History:  The patient's family history includes Cancer in his father; Colon cancer in his mother; Diabetes in his brother, mother, and sister; Heart disease in his mother; Liver cancer  in his mother.    ROS:  Please see the history of present illness. All other systems are reviewed and  Negative to the above problem except as noted.    PHYSICAL EXAM: VS:  BP 110/70   Pulse 69   Ht 6\' 3"  (1.905 m)   Wt 190 lb (86.2 kg)   SpO2 96%   BMI 23.75 kg/m   GEN: Well nourished, well developed, in no acute distress  HEENT: normal  Neck: no JVD, carotid bruits, or masses Cardiac: RRR; no murmurs, rubs, or gallops,no edema  Respiratory:  clear to auscultation bilaterally, normal work of breathing GI: soft, nontender, nondistended, + BS  No hepatomegaly  MS: no deformity Moving all extremities   Skin: warm and dry, no rash Neuro:  Strength and sensation are intact Psych: euthymic mood, full affect   EKG:  EKG is not  ordered today.   Lipid Panel    Component Value Date/Time   CHOL 147 01/11/2016 0446   TRIG 69 01/11/2016 0446   HDL 53 01/11/2016 0446   CHOLHDL 2.8 01/11/2016 0446   VLDL 14 01/11/2016 0446   LDLCALC 80 01/11/2016 0446      Wt Readings from Last 3 Encounters:  01/31/17 190 lb (86.2 kg)  11/21/16 188 lb (85.3 kg)  11/20/16 188 lb (85.3 kg)      ASSESSMENT AND PLAN:  1.  Chronic systolic CHF  Volume status is OK  Will get labs  He was on aldactone in past  Not sure why stopped  Will review after labs  2.  Mild CAD I am not convinced CP is ischemic  Atypical    3.  HTN  BP is good    4  Tob  Counselled on cessation    BMET today   F/ u in December        Signed, Dietrich Pates, MD  01/31/2017 12:06 PM    [facility] [address] [place], [place], [place]  [address] Phone: [phone]; Fax: [phone]

## 2017-01-31 ENCOUNTER — Encounter (INDEPENDENT_AMBULATORY_CARE_PROVIDER_SITE_OTHER): Payer: Self-pay

## 2017-01-31 ENCOUNTER — Ambulatory Visit (INDEPENDENT_AMBULATORY_CARE_PROVIDER_SITE_OTHER): Payer: Self-pay | Admitting: Internal Medicine

## 2017-01-31 ENCOUNTER — Encounter: Payer: Self-pay | Admitting: Internal Medicine

## 2017-01-31 DIAGNOSIS — I5043 Acute on chronic combined systolic (congestive) and diastolic (congestive) heart failure: Secondary | ICD-10-CM

## 2017-01-31 DIAGNOSIS — I10 Essential (primary) hypertension: Secondary | ICD-10-CM

## 2017-01-31 DIAGNOSIS — I2510 Atherosclerotic heart disease of native coronary artery without angina pectoris: Secondary | ICD-10-CM

## 2017-01-31 LAB — BASIC METABOLIC PANEL
BUN/Creatinine Ratio: 14 (ref 10–24)
BUN: 16 mg/dL (ref 8–27)
CO2: 24 mmol/L (ref 20–29)
Calcium: 9 mg/dL (ref 8.6–10.2)
Chloride: 102 mmol/L (ref 96–106)
Creatinine, Ser: 1.13 mg/dL (ref 0.76–1.27)
GFR calc Af Amer: 80 mL/min/{1.73_m2} (ref >59)
GFR calc non Af Amer: 69 mL/min/{1.73_m2} (ref >59)
Glucose: 92 mg/dL (ref 65–99)
Potassium: 4.2 mmol/L (ref 3.5–5.2)
Sodium: 142 mmol/L (ref 134–144)

## 2017-01-31 NOTE — Patient Instructions (Signed)
Your physician recommends that you continue on your current medications as directed. Please refer to the Current Medication list given to you today. Your physician recommends that you return for lab work today (BMET). Your physician wants you to follow-up in: January, 2019 with Dr. Tenny Craw.  You will receive a reminder letter in the mail two months in advance. If you don't receive a letter, please call our office to schedule the follow-up appointment.

## 2017-02-01 ENCOUNTER — Other Ambulatory Visit: Payer: Self-pay | Admitting: *Deleted

## 2017-02-01 DIAGNOSIS — I420 Dilated cardiomyopathy: Secondary | ICD-10-CM

## 2017-02-01 MED ORDER — SPIRONOLACTONE 25 MG PO TABS: 12.5000 mg | ORAL_TABLET | Freq: Every day | ORAL | 11 refills | Status: DC

## 2017-02-04 MED FILL — ?SPIRONOLACTONE 25 MG TABLE: 25 | 30 days supply | Qty: 15 | Fill #0

## 2017-02-14 ENCOUNTER — Other Ambulatory Visit: Payer: Medicaid Other

## 2017-02-20 ENCOUNTER — Ambulatory Visit: Payer: Self-pay | Admitting: Family Medicine

## 2017-03-18 MED FILL — BISOPROLOL FUMARATE 5 MG TA: 5 | 30 days supply | Qty: 30 | Fill #3

## 2017-03-18 MED FILL — ?SPIRONOLACTONE 25 MG TABLE: 25 | 30 days supply | Qty: 15 | Fill #1

## 2017-03-18 MED FILL — WARFARIN SODIUM 7.5 MG TAB: 7.5 | 30 days supply | Qty: 40 | Fill #3

## 2017-04-22 MED FILL — BISOPROLOL FUMARATE 5 MG TA: 5 | 30 days supply | Qty: 30 | Fill #4

## 2017-04-22 MED FILL — ?SPIRONOLACTONE 25 MG TABLE: 25 | 30 days supply | Qty: 15 | Fill #2

## 2017-06-01 ENCOUNTER — Encounter (HOSPITAL_COMMUNITY): Payer: Self-pay | Admitting: Emergency Medicine

## 2017-06-01 ENCOUNTER — Emergency Department (HOSPITAL_COMMUNITY)
Admission: EM | Admit: 2017-06-01 | Discharge: 2017-06-02 | Disposition: A | Discharge status: Home / Self Care | Payer: Self-pay | Attending: Emergency Medicine | Admitting: Emergency Medicine

## 2017-06-01 DIAGNOSIS — F17210 Nicotine dependence, cigarettes, uncomplicated: Secondary | ICD-10-CM | POA: Insufficient documentation

## 2017-06-01 DIAGNOSIS — Z79899 Other long term (current) drug therapy: Secondary | ICD-10-CM | POA: Insufficient documentation

## 2017-06-01 DIAGNOSIS — K611 Rectal abscess: Secondary | ICD-10-CM | POA: Insufficient documentation

## 2017-06-01 DIAGNOSIS — R05 Cough: Secondary | ICD-10-CM | POA: Insufficient documentation

## 2017-06-01 DIAGNOSIS — I110 Hypertensive heart disease with heart failure: Secondary | ICD-10-CM | POA: Insufficient documentation

## 2017-06-01 DIAGNOSIS — Z7901 Long term (current) use of anticoagulants: Secondary | ICD-10-CM | POA: Insufficient documentation

## 2017-06-01 DIAGNOSIS — R39198 Other difficulties with micturition: Secondary | ICD-10-CM | POA: Insufficient documentation

## 2017-06-01 DIAGNOSIS — I2510 Atherosclerotic heart disease of native coronary artery without angina pectoris: Secondary | ICD-10-CM | POA: Insufficient documentation

## 2017-06-01 DIAGNOSIS — J449 Chronic obstructive pulmonary disease, unspecified: Secondary | ICD-10-CM | POA: Insufficient documentation

## 2017-06-01 DIAGNOSIS — K047 Periapical abscess without sinus: Secondary | ICD-10-CM | POA: Insufficient documentation

## 2017-06-01 DIAGNOSIS — I5042 Chronic combined systolic (congestive) and diastolic (congestive) heart failure: Secondary | ICD-10-CM | POA: Insufficient documentation

## 2017-06-01 NOTE — ED Triage Notes (Signed)
Pt states he has been having pain in his rectum since Wednesday and can feel swelling inside of his rectum that he believes  is an abscess. Pt has had this occur before on the outside of his rectum.

## 2017-06-02 ENCOUNTER — Emergency Department (HOSPITAL_COMMUNITY): Payer: Self-pay

## 2017-06-02 LAB — CBC WITH DIFFERENTIAL/PLATELET
Basophils Absolute: 0 10*3/uL (ref 0.0–0.1)
Basophils Relative: 0 %
Eosinophils Absolute: 0.2 10*3/uL (ref 0.0–0.7)
Eosinophils Relative: 1 %
HCT: 42.5 % (ref 39.0–52.0)
Hemoglobin: 13.8 g/dL (ref 13.0–17.0)
Lymphocytes Relative: 32 %
Lymphs Abs: 3.7 10*3/uL (ref 0.7–4.0)
MCH: 29.8 pg (ref 26.0–34.0)
MCHC: 32.5 g/dL (ref 30.0–36.0)
MCV: 91.8 fL (ref 78.0–100.0)
Monocytes Absolute: 1.2 10*3/uL — ABNORMAL HIGH (ref 0.1–1.0)
Monocytes Relative: 11 %
Neutro Abs: 6.4 10*3/uL (ref 1.7–7.7)
Neutrophils Relative %: 56 %
Platelets: 199 10*3/uL (ref 150–400)
RBC: 4.63 MIL/uL (ref 4.22–5.81)
RDW: 13.4 % (ref 11.5–15.5)
WBC: 11.5 10*3/uL — ABNORMAL HIGH (ref 4.0–10.5)

## 2017-06-02 LAB — BASIC METABOLIC PANEL
Anion gap: 8 (ref 5–15)
BUN: 16 mg/dL (ref 6–20)
CO2: 27 mmol/L (ref 22–32)
Calcium: 8.8 mg/dL — ABNORMAL LOW (ref 8.9–10.3)
Chloride: 104 mmol/L (ref 101–111)
Creatinine, Ser: 1 mg/dL (ref 0.61–1.24)
GFR calc Af Amer: >60 mL/min (ref >60)
GFR calc non Af Amer: >60 mL/min (ref >60)
Glucose, Bld: 93 mg/dL (ref 65–99)
Potassium: 3.8 mmol/L (ref 3.5–5.1)
Sodium: 139 mmol/L (ref 135–145)

## 2017-06-02 MED ORDER — DOCUSATE SODIUM 100 MG PO CAPS: 100.0000 mg | ORAL_CAPSULE | Freq: Two times a day (BID) | ORAL | 0 refills | Status: DC

## 2017-06-02 MED ORDER — ONDANSETRON HCL 4 MG/2ML IJ SOLN
4.0000 mg | Freq: Once | INTRAMUSCULAR | Status: AC
  Administered 2017-06-02: 4 mg via INTRAVENOUS
  Filled 2017-06-02: qty 2

## 2017-06-02 MED ORDER — HYDROMORPHONE HCL 1 MG/ML IJ SOLN
1.0000 mg | Freq: Once | INTRAMUSCULAR | Status: AC
  Administered 2017-06-02: 1 mg via INTRAVENOUS
  Filled 2017-06-02: qty 1

## 2017-06-02 MED ORDER — IOPAMIDOL (ISOVUE-300) INJECTION 61%
INTRAVENOUS | Status: AC
  Administered 2017-06-02: 100 mL
  Filled 2017-06-02: qty 100

## 2017-06-02 MED ORDER — DOXYCYCLINE HYCLATE 100 MG PO TABS
100.0000 mg | ORAL_TABLET | Freq: Once | ORAL | Status: AC
  Administered 2017-06-02: 100 mg via ORAL
  Filled 2017-06-02: qty 1

## 2017-06-02 MED ORDER — POLYETHYLENE GLYCOL 3350 17 GM/SCOOP PO POWD: 17.0000 g | Freq: Two times a day (BID) | ORAL | 0 refills | Status: DC

## 2017-06-02 MED ORDER — HYDROCODONE-ACETAMINOPHEN 5-325 MG PO TABS: 1.0000 | ORAL_TABLET | Freq: Four times a day (QID) | ORAL | 0 refills | Status: DC | PRN

## 2017-06-02 MED ORDER — DOXYCYCLINE HYCLATE 100 MG PO CAPS: 100.0000 mg | ORAL_CAPSULE | Freq: Two times a day (BID) | ORAL | 0 refills | Status: DC

## 2017-06-02 NOTE — Discharge Instructions (Signed)
Please get your Coumadin checked in 3 days.  The antibiotic can increase the effect of the coumadin.  Please return for worsening symptoms.

## 2017-06-02 NOTE — ED Provider Notes (Signed)
[facility] EMERGENCY DEPARTMENT Provider Note   CSN: [id number] Arrival date & time: 06/01/17  1802     History   Chief Complaint Chief Complaint  Patient presents with  . Abscess    HPI Jared [name] is a 61 y.o. male.  Patient presents to the ED with a chief complaint of rectal pain.  He reports onset of his symptoms was 3 days ago.  He reports increased pain with defecation.  Also reports pain with coughing and sneezing.  He reports subjective fever, but denies measuring an elevated temperature.  He denies having taken anything for his symptoms.  He denies any history of the same.  He reports associated difficulty with urination.   The history is provided by the patient. No language interpreter was used.    Past Medical History:  Diagnosis Date  . CAD (coronary artery disease)    a. LHC 5/12:  LAD 20, pLCx 20, pRCA 40, dRCA 40, EF 35%, diff HK  //  b. Myoview 4/16: Overall Impression: High risk stress nuclear study There is no evidence of ischemia. There is severe LV dysfunction. LV Ejection Fraction: 30%. LV Wall Motion: There is global LV hypokinesis.   Marland Kitchen. CAP (community acquired pneumonia) 09/2013  . Chronic combined systolic and diastolic CHF (congestive heart failure) (HCC)    a. Echo 4/16:Mild LVH, EF 40-45%, diffuse HK //  b. Echo 8/17: EF 35-40%, diffuse HK, diastolic dysfunction, aortic sclerosis, trivial MR, moderate LAE, normal RVSF, moderate RAE, mild TR, PASP 42 mmHg // c. Echo 4/18: Mild concentric LVH, EF 30-35, normal wall motion, grade 1 diastolic dysfunction, PASP 49  . Cluster headache    "hx; haven't had one in awhile" (01/09/2016)  . COPD (chronic obstructive pulmonary disease) (HCC)    [name]/notes 01/09/2016  . History of CVA (cerebrovascular accident)   . Hypertension   . NICM (nonischemic cardiomyopathy) (HCC)   . Tobacco abuse     Patient Active Problem List   Diagnosis Date Noted  . Elevated LFTs 08/13/2016  . Periumbilical  abdominal pain 08/13/2016  . Chronic respiratory failure with hypoxia (HCC) 07/24/2016  . Encounter for therapeutic drug monitoring 07/11/2016  . Atrial flutter (HCC)   . Hepatic congestion 07/02/2016  . Chronic combined systolic and diastolic CHF (congestive heart failure) (HCC) 07/01/2016  . COPD exacerbation (HCC) 06/03/2016  . Prediabetes 05/14/2016  . Hemoptysis 05/06/2016  . COPD GOLD III/still smoking  02/29/2016  . Cigarette smoker 01/09/2016  . CAD (coronary artery disease) 10/07/2013  . 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 Centrilobular emphysema (HCC) 10/04/2013  . Pulmonary infiltrate in right lung on CXR 10/03/2013  . Essential hypertension     Past Surgical History:  Procedure Laterality Date  . CARDIAC CATHETERIZATION  10/2010   LM normal, LAD with 20% irregularities, LCX with 20%, RCA with 40% prox and 40% distal - EF of 35%  . COLONOSCOPY W/ BIOPSIES AND POLYPECTOMY    . EXCISION MASS HEAD    . VIDEO BRONCHOSCOPY Bilateral 05/08/2016   Procedure: VIDEO BRONCHOSCOPY WITH FLUORO;  Surgeon: Oretha Milchakesh V Alva, MD;  [facility];  Service: Cardiopulmonary;  Laterality: Bilateral;       Home Medications    Prior to Admission medications   Medication Sig Start Date End Date Taking? Authorizing Provider  albuterol (PROVENTIL HFA;VENTOLIN HFA) 108 (90 Base) MCG/ACT inhaler Inhale 2 puffs into the lungs every 6 (six) hours as needed for wheezing or shortness of breath. 11/20/16   Amao,  Enobong, MD  bisoprolol (ZEBETA) 5 MG tablet Take 1 tablet (5 mg total) by mouth daily. 11/21/16   Tereso Newcomer T, PA-C  furosemide (LASIX) 40 MG tablet Take 1 tablet (40 mg total) by mouth daily. 11/21/16   Tereso Newcomer T, PA-C  Glycopyrrolate-Formoterol (BEVESPI AEROSPHERE) 9-4.8 MCG/ACT AERO Inhale 2 puffs into the lungs 2 (two) times daily. 08/23/16   Nyoka Cowden, MD  hyoscyamine (LEVSIN SL) 0.125 MG SL tablet Place 1 tablet (0.125 mg total) under the tongue  every 8 (eight) hours as needed. 08/08/16   Zehr, Princella Pellegrini, PA-C  OXYGEN 2lpm when needed per pt    [provider]  sacubitril-valsartan (ENTRESTO) 24-26 MG Take 1 tablet by mouth 2 (two) times daily. 09/21/16   Jaclyn Shaggy, MD  spironolactone (ALDACTONE) 25 MG tablet Take 0.5 tablets (12.5 mg total) by mouth daily. 02/01/17   Pricilla Riffle, MD  warfarin (COUMADIN) 7.5 MG tablet Take as directed by coumadin clinic 11/21/16   Pricilla Riffle, MD    Family History Family History  Problem Relation Age of Onset  . Heart disease Mother   . Diabetes Mother   . Colon cancer Mother   . Liver cancer Mother   . Cancer Father        type unknown  . Diabetes Sister        x 2  . Diabetes Brother     Social History Social History   Tobacco Use  . Smoking status: Current Every Day Smoker    Packs/day: 0.25    Years: 42.00    Pack years: 10.50    Types: Cigarettes  . Smokeless tobacco: Never Used  . Tobacco comment: 3 cigs daily  Substance Use Topics  . Alcohol use: Yes    Alcohol/week: 0.0 oz    Comment: last drink was before xmas  . Drug use: No    Comment: "nothing in 20 years"     Allergies   Lisinopril   Review of Systems Review of Systems  All other systems reviewed and are negative.    Physical Exam Updated Vital Signs BP (!) 141/93 (BP Location: Right Arm)   Pulse 98   Temp 99.2 F (37.3 C) (Oral)   Resp 19   Ht 6\' 3"  (1.905 m)   Wt 86.2 kg (190 lb)   SpO2 97%   BMI 23.75 kg/m   Physical Exam  Constitutional: He is oriented to person, place, and time. He appears well-developed and well-nourished.  HENT:  Head: Normocephalic and atraumatic.  Eyes: Conjunctivae and EOM are normal. Pupils are equal, round, and reactive to light. Right eye exhibits no discharge. Left eye exhibits no discharge. No scleral icterus.  Neck: Normal range of motion. Neck supple. No JVD present.  Cardiovascular: Normal rate, regular rhythm and normal heart sounds. Exam  reveals no gallop and no friction rub.  No murmur heard. Pulmonary/Chest: Effort normal and breath sounds normal. No respiratory distress. He has no wheezes. He has no rales. He exhibits no tenderness.  Abdominal: Soft. He exhibits no distension and no mass. There is no tenderness. There is no rebound and no guarding.  Genitourinary:  Genitourinary Comments: Induration of the rectum at the 8 o'clock position  Musculoskeletal: Normal range of motion. He exhibits no edema or tenderness.  Neurological: He is alert and oriented to person, place, and time.  Skin: Skin is warm and dry.  Psychiatric: He has a normal mood and affect. His behavior is normal. Judgment  and thought content normal.  Nursing note and vitals reviewed.    ED Treatments / Results  Labs (all labs ordered are listed, but only abnormal results are displayed) Labs Reviewed  CBC WITH DIFFERENTIAL/PLATELET - Abnormal; Notable for the following components:      Result Value   WBC 11.5 (*)    Monocytes Absolute 1.2 (*)    All other components within normal limits  BASIC METABOLIC PANEL - Abnormal; Notable for the following components:   Calcium 8.8 (*)    All other components within normal limits    EKG  EKG Interpretation None       Radiology Ct Pelvis W Contrast  Result Date: 06/02/2017 CLINICAL DATA:  61 year old male with rectal pain and swelling. History of colonoscopy and cholecystectomy. EXAM: CT PELVIS WITH CONTRAST TECHNIQUE: Multidetector CT imaging of the pelvis was performed using the standard protocol following the bolus administration of intravenous contrast. CONTRAST:  ISOVUE-300 IOPAMIDOL (ISOVUE-300) INJECTION 61% COMPARISON:  None. FINDINGS: Urinary Tract: The visualized distal ureters and urinary bladder appear unremarkable. Bowel: No bowel dilatation or active inflammation in the pelvis. Normal appendix. Vascular/Lymphatic: Advanced aortoiliac atherosclerotic disease. No pelvic adenopathy.  Reproductive: Stop the prostate and seminal vesicles are grossly unremarkable. Other: There is a 15 x 23 mm low attenuating focus along the posterior perirectal soft tissue on the left most consistent with a phlegmon or developing abscess. Clinical correlation is recommended. Musculoskeletal: Degenerative changes at L5-S1. No acute osseous pathology. IMPRESSION: Small left posterior perirectal phlegmon/developing abscess. Clinical correlation is recommended. No other acute pelvic pathology. Electronically Signed   By: Elgie Collard M.D.   On: 06/02/2017 03:34    Procedures Procedures (including critical care time)  Medications Ordered in ED Medications  HYDROmorphone (DILAUDID) injection 1 mg (not administered)  ondansetron (ZOFRAN) injection 4 mg (not administered)     Initial Impression / Assessment and Plan / ED Course  I have reviewed the triage vital signs and the nursing notes.  Pertinent labs & imaging results that were available during my care of the patient were reviewed by me and considered in my medical decision making (see chart for details).     Patient with perirectal pain.  TTP on exam.  Reports subjective fevers.  Concern for perirectal abscess.    Small 15x23 mm perirectal abscess.  I discussed these results with Dr. Derrell Lolling from general surgery, who states that given the very small size, we should try outpatient antibiotic therapy.  Patient is agreeable with this plan.  Will put patient on doxy.  Recommend coumadin recheck in a few days.  Recommend return for worsening symptoms.  Patient understands and agrees with the plan.  Final Clinical Impressions(s) / ED Diagnoses   Final diagnoses:  Perirectal abscess  Dental abscess    ED Discharge Orders        Ordered    doxycycline (VIBRAMYCIN) 100 MG capsule  2 times daily     06/02/17 0447    HYDROcodone-acetaminophen (NORCO/VICODIN) 5-325 MG tablet  Every 6 hours PRN     06/02/17 0447    docusate sodium (COLACE)  100 MG capsule  Every 12 hours     06/02/17 0447    polyethylene glycol powder (GLYCOLAX/MIRALAX) powder  2 times daily     06/02/17 0447       Roxy Horseman, PA-C 06/02/17 1610    Wilkie Aye, Mayer Masker, MD 06/02/17 [phone]

## 2017-06-02 NOTE — ED Notes (Signed)
Patient transported to CT 

## 2017-06-04 ENCOUNTER — Inpatient Hospital Stay (HOSPITAL_COMMUNITY)
Admission: EM | Admit: 2017-06-04 | Discharge: 2017-06-06 | DRG: 345 | Disposition: A | Discharge status: Home / Self Care | Payer: Self-pay | Attending: Internal Medicine | Admitting: Internal Medicine

## 2017-06-04 ENCOUNTER — Emergency Department (HOSPITAL_COMMUNITY): Payer: Self-pay

## 2017-06-04 ENCOUNTER — Encounter (HOSPITAL_COMMUNITY): Payer: Self-pay | Admitting: Emergency Medicine

## 2017-06-04 ENCOUNTER — Other Ambulatory Visit: Payer: Self-pay

## 2017-06-04 DIAGNOSIS — I5043 Acute on chronic combined systolic (congestive) and diastolic (congestive) heart failure: Secondary | ICD-10-CM | POA: Diagnosis present

## 2017-06-04 DIAGNOSIS — I110 Hypertensive heart disease with heart failure: Secondary | ICD-10-CM | POA: Diagnosis present

## 2017-06-04 DIAGNOSIS — J449 Chronic obstructive pulmonary disease, unspecified: Secondary | ICD-10-CM | POA: Diagnosis present

## 2017-06-04 DIAGNOSIS — K611 Rectal abscess: Secondary | ICD-10-CM | POA: Diagnosis present

## 2017-06-04 DIAGNOSIS — I480 Paroxysmal atrial fibrillation: Secondary | ICD-10-CM | POA: Diagnosis present

## 2017-06-04 DIAGNOSIS — I5042 Chronic combined systolic (congestive) and diastolic (congestive) heart failure: Secondary | ICD-10-CM

## 2017-06-04 DIAGNOSIS — I5022 Chronic systolic (congestive) heart failure: Secondary | ICD-10-CM | POA: Diagnosis present

## 2017-06-04 DIAGNOSIS — Z7901 Long term (current) use of anticoagulants: Secondary | ICD-10-CM

## 2017-06-04 DIAGNOSIS — K612 Anorectal abscess: Principal | ICD-10-CM | POA: Diagnosis present

## 2017-06-04 DIAGNOSIS — I420 Dilated cardiomyopathy: Secondary | ICD-10-CM | POA: Diagnosis present

## 2017-06-04 DIAGNOSIS — I2510 Atherosclerotic heart disease of native coronary artery without angina pectoris: Secondary | ICD-10-CM | POA: Diagnosis present

## 2017-06-04 DIAGNOSIS — Z8673 Personal history of transient ischemic attack (TIA), and cerebral infarction without residual deficits: Secondary | ICD-10-CM

## 2017-06-04 DIAGNOSIS — Z888 Allergy status to other drugs, medicaments and biological substances status: Secondary | ICD-10-CM

## 2017-06-04 DIAGNOSIS — Z79899 Other long term (current) drug therapy: Secondary | ICD-10-CM

## 2017-06-04 DIAGNOSIS — I4892 Unspecified atrial flutter: Secondary | ICD-10-CM | POA: Diagnosis present

## 2017-06-04 DIAGNOSIS — F17210 Nicotine dependence, cigarettes, uncomplicated: Secondary | ICD-10-CM | POA: Diagnosis present

## 2017-06-04 LAB — COMPREHENSIVE METABOLIC PANEL
ALT: 10 U/L — ABNORMAL LOW (ref 17–63)
AST: 20 U/L (ref 15–41)
Albumin: 3.5 g/dL (ref 3.5–5.0)
Alkaline Phosphatase: 63 U/L (ref 38–126)
Anion gap: 10 (ref 5–15)
BUN: 11 mg/dL (ref 6–20)
CO2: 27 mmol/L (ref 22–32)
Calcium: 8.8 mg/dL — ABNORMAL LOW (ref 8.9–10.3)
Chloride: 101 mmol/L (ref 101–111)
Creatinine, Ser: 1.06 mg/dL (ref 0.61–1.24)
GFR calc Af Amer: >60 mL/min (ref >60)
GFR calc non Af Amer: >60 mL/min (ref >60)
Glucose, Bld: 83 mg/dL (ref 65–99)
Potassium: 3.7 mmol/L (ref 3.5–5.1)
Sodium: 138 mmol/L (ref 135–145)
Total Bilirubin: 0.9 mg/dL (ref 0.3–1.2)
Total Protein: 7 g/dL (ref 6.5–8.1)

## 2017-06-04 LAB — CBC WITH DIFFERENTIAL/PLATELET
Basophils Absolute: 0 10*3/uL (ref 0.0–0.1)
Basophils Relative: 0 %
Eosinophils Absolute: 0.1 10*3/uL (ref 0.0–0.7)
Eosinophils Relative: 0 %
HCT: 41.9 % (ref 39.0–52.0)
Hemoglobin: 13.9 g/dL (ref 13.0–17.0)
Lymphocytes Relative: 23 %
Lymphs Abs: 3.2 10*3/uL (ref 0.7–4.0)
MCH: 30.5 pg (ref 26.0–34.0)
MCHC: 33.2 g/dL (ref 30.0–36.0)
MCV: 91.9 fL (ref 78.0–100.0)
Monocytes Absolute: 1.5 10*3/uL — ABNORMAL HIGH (ref 0.1–1.0)
Monocytes Relative: 11 %
Neutro Abs: 9.1 10*3/uL — ABNORMAL HIGH (ref 1.7–7.7)
Neutrophils Relative %: 66 %
Platelets: 197 10*3/uL (ref 150–400)
RBC: 4.56 MIL/uL (ref 4.22–5.81)
RDW: 13.1 % (ref 11.5–15.5)
WBC: 14 10*3/uL — ABNORMAL HIGH (ref 4.0–10.5)

## 2017-06-04 LAB — PROTIME-INR
INR: 1.1
Prothrombin Time: 14.1 seconds (ref 11.4–15.2)

## 2017-06-04 LAB — I-STAT CG4 LACTIC ACID, ED: Lactic Acid, Venous: 1.54 mmol/L (ref 0.5–1.9)

## 2017-06-04 MED ORDER — METOPROLOL TARTRATE 5 MG/5ML IV SOLN
2.5000 mg | Freq: Four times a day (QID) | INTRAVENOUS | Status: DC
  Administered 2017-06-05 (×3): 2.5 mg via INTRAVENOUS
  Filled 2017-06-04 (×2): qty 5

## 2017-06-04 MED ORDER — PIPERACILLIN-TAZOBACTAM 3.375 G IVPB 30 MIN
3.3750 g | Freq: Once | INTRAVENOUS | Status: AC
  Administered 2017-06-04: 3.375 g via INTRAVENOUS
  Filled 2017-06-04: qty 50

## 2017-06-04 MED ORDER — PIPERACILLIN-TAZOBACTAM 3.375 G IVPB
3.3750 g | Freq: Three times a day (TID) | INTRAVENOUS | Status: DC
Start: 2017-06-05 — End: 2017-06-06
  Administered 2017-06-05 – 2017-06-06 (×4): 3.375 g via INTRAVENOUS
  Filled 2017-06-04 (×5): qty 50

## 2017-06-04 MED ORDER — ACETAMINOPHEN 650 MG RE SUPP: 650.0000 mg | Freq: Four times a day (QID) | RECTAL | Status: DC | PRN

## 2017-06-04 MED ORDER — ONDANSETRON HCL 4 MG PO TABS: 4.0000 mg | ORAL_TABLET | Freq: Four times a day (QID) | ORAL | Status: DC | PRN

## 2017-06-04 MED ORDER — ACETAMINOPHEN 325 MG PO TABS: 650.0000 mg | ORAL_TABLET | Freq: Four times a day (QID) | ORAL | Status: DC | PRN

## 2017-06-04 MED ORDER — ONDANSETRON HCL 4 MG/2ML IJ SOLN: 4.0000 mg | Freq: Four times a day (QID) | INTRAMUSCULAR | Status: DC | PRN

## 2017-06-04 MED ORDER — SODIUM CHLORIDE 0.9 % IV BOLUS (SEPSIS)
1000.0000 mL | Freq: Once | INTRAVENOUS | Status: AC
  Administered 2017-06-04: 1000 mL via INTRAVENOUS

## 2017-06-04 MED ORDER — IOPAMIDOL (ISOVUE-300) INJECTION 61%
INTRAVENOUS | Status: AC
  Administered 2017-06-04: 100 mL
  Filled 2017-06-04: qty 100

## 2017-06-04 MED ORDER — MORPHINE SULFATE (PF) 4 MG/ML IV SOLN
4.0000 mg | Freq: Once | INTRAVENOUS | Status: AC
  Administered 2017-06-04: 4 mg via INTRAVENOUS
  Filled 2017-06-04: qty 1

## 2017-06-04 MED ORDER — HYDRALAZINE HCL 20 MG/ML IJ SOLN: 10.0000 mg | INTRAMUSCULAR | Status: DC | PRN

## 2017-06-04 MED ORDER — ALBUTEROL SULFATE (2.5 MG/3ML) 0.083% IN NEBU: 2.5000 mg | INHALATION_SOLUTION | RESPIRATORY_TRACT | Status: DC | PRN

## 2017-06-04 NOTE — ED Provider Notes (Signed)
[name] Provider Note   CSN: [id number] Arrival date & time: 06/04/17  1559     History   Chief Complaint Chief Complaint  Patient presents with  . Abscess  . perirectal abscess    HPI [name] is a 61 y.o. male.  The history is provided by the patient and medical records.  Abscess  Location:  Pelvis Pelvic abscess location:  Perianal Abscess quality: fluctuance, painful, redness and warmth   Red streaking: no   Duration:  5 days Progression:  Worsening Pain details:    Severity:  Severe   Duration:  5 days   Timing:  Constant   Progression:  Worsening Chronicity:  New Context: not diabetes and not immunosuppression   Relieved by:  Nothing Exacerbated by: movement, defecation. Ineffective treatments: doxycycline. Associated symptoms: no fever, no nausea and no vomiting   Risk factors: prior abscess     Past Medical History:  Diagnosis Date  . CAD (coronary artery disease)    a. LHC 5/12:  LAD 20, pLCx 20, pRCA 40, dRCA 40, EF 35%, diff HK  //  b. Myoview 4/16: Overall Impression: High risk stress nuclear study There is no evidence of ischemia. There is severe LV dysfunction. LV Ejection Fraction: 30%. LV Wall Motion: There is global LV hypokinesis.   Marland Kitchen CAP (community acquired pneumonia) 09/2013  . Chronic combined systolic and diastolic CHF (congestive heart failure) (HCC)    a. Echo 4/16:Mild LVH, EF 40-45%, diffuse HK //  b. Echo 8/17: EF 35-40%, diffuse HK, diastolic dysfunction, aortic sclerosis, trivial MR, moderate LAE, normal RVSF, moderate RAE, mild TR, PASP 42 mmHg // c. Echo 4/18: Mild concentric LVH, EF 30-35, normal wall motion, grade 1 diastolic dysfunction, PASP 49  . Cluster headache    "hx; haven't had one in awhile" (01/09/2016)  . COPD (chronic obstructive pulmonary disease) (HCC)    [name] 01/09/2016  . History of CVA (cerebrovascular accident)   . Hypertension   . NICM (nonischemic  cardiomyopathy) (HCC)   . Tobacco abuse     Patient Active Problem List   Diagnosis Date Noted  . Elevated LFTs 08/13/2016  . Periumbilical abdominal pain 08/13/2016  . Chronic respiratory failure with hypoxia (HCC) 07/24/2016  . Encounter for therapeutic drug monitoring 07/11/2016  . Atrial flutter (HCC)   . Hepatic congestion 07/02/2016  . Chronic combined systolic and diastolic CHF (congestive heart failure) (HCC) 07/01/2016  . COPD exacerbation (HCC) 06/03/2016  . Prediabetes 05/14/2016  . Hemoptysis 05/06/2016  . COPD GOLD III/still smoking  02/29/2016  . Cigarette smoker 01/09/2016  . CAD (coronary artery disease) 10/07/2013  . 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 Centrilobular emphysema (HCC) 10/04/2013  . Pulmonary infiltrate in right lung on CXR 10/03/2013  . Essential hypertension     Past Surgical History:  Procedure Laterality Date  . CARDIAC CATHETERIZATION  10/2010   LM normal, LAD with 20% irregularities, LCX with 20%, RCA with 40% prox and 40% distal - EF of 35%  . COLONOSCOPY W/ BIOPSIES AND POLYPECTOMY    . EXCISION MASS HEAD    . VIDEO BRONCHOSCOPY Bilateral 05/08/2016   Procedure: VIDEO BRONCHOSCOPY WITH FLUORO;  Surgeon: Oretha Milch, MD;  Location: [facility] END[facility]COPY;  Service: Cardiopulmonary;  Laterality: Bilateral;       Home Medications    Prior to Admission medications   Medication Sig Start Date End Date Taking? Authorizing Provider  albuterol (PROVENTIL HFA;VENTOLIN HFA) 108 (90 Base) MCG/ACT inhaler Inhale  2 puffs into the lungs every 6 (six) hours as needed for wheezing or shortness of breath. 11/20/16   Jaclyn ShaggyAmao, Enobong, MD  bisoprolol (ZEBETA) 5 MG tablet Take 1 tablet (5 mg total) by mouth daily. 11/21/16   Tereso NewcomerWeaver, Scott T, PA-C  docusate sodium (COLACE) 100 MG capsule Take 1 capsule (100 mg total) by mouth every 12 (twelve) hours. 06/02/17   Roxy HorsemanBrowning, Robert, PA-C  doxycycline (VIBRAMYCIN) 100 MG capsule Take 1 capsule (100 mg  total) by mouth 2 (two) times daily. 06/02/17   Roxy HorsemanBrowning, Robert, PA-C  furosemide (LASIX) 40 MG tablet Take 1 tablet (40 mg total) by mouth daily. 11/21/16   Tereso NewcomerWeaver, Scott T, PA-C  Glycopyrrolate-Formoterol (BEVESPI AEROSPHERE) 9-4.8 MCG/ACT AERO Inhale 2 puffs into the lungs 2 (two) times daily. 08/23/16   Nyoka CowdenWert, Michael B, MD  HYDROcodone-acetaminophen (NORCO/VICODIN) 5-325 MG tablet Take 1-2 tablets by mouth every 6 (six) hours as needed. 06/02/17   Roxy HorsemanBrowning, Robert, PA-C  hyoscyamine (LEVSIN SL) 0.125 MG SL tablet Place 1 tablet (0.125 mg total) under the tongue every 8 (eight) hours as needed. 08/08/16   Zehr, Princella PellegriniJessica D, PA-C  OXYGEN 2lpm when needed per pt    [provider]  polyethylene glycol powder (GLYCOLAX/MIRALAX) powder Take 17 g by mouth 2 (two) times daily. Until daily soft stools  OTC 06/02/17   Roxy HorsemanBrowning, Robert, PA-C  sacubitril-valsartan (ENTRESTO) 24-26 MG Take 1 tablet by mouth 2 (two) times daily. 09/21/16   Jaclyn ShaggyAmao, Enobong, MD  spironolactone (ALDACTONE) 25 MG tablet Take 0.5 tablets (12.5 mg total) by mouth daily. 02/01/17   Pricilla Riffleoss, Paula V, MD  warfarin (COUMADIN) 7.5 MG tablet Take as directed by coumadin clinic 11/21/16   Pricilla Riffleoss, Paula V, MD    Family History Family History  Problem Relation Age of Onset  . Heart disease Mother   . Diabetes Mother   . Colon cancer Mother   . Liver cancer Mother   . Cancer Father        type unknown  . Diabetes Sister        x 2  . Diabetes Brother     Social History Social History   Tobacco Use  . Smoking status: Current Every Day Smoker    Packs/day: 0.25    Years: 42.00    Pack years: 10.50    Types: Cigarettes  . Smokeless tobacco: Never Used  . Tobacco comment: 3 cigs daily  Substance Use Topics  . Alcohol use: Yes    Alcohol/week: 0.0 oz    Comment: last drink was before xmas  . Drug use: No    Comment: "nothing in 20 years"     Allergies   Lisinopril   Review of Systems Review of Systems    Constitutional: Negative for chills and fever.  HENT: Negative for rhinorrhea and sore throat.   Eyes: Negative for visual disturbance.  Respiratory: Negative for cough and shortness of breath.   Cardiovascular: Negative for chest pain.  Gastrointestinal: Negative for abdominal pain, nausea and vomiting.  Genitourinary: Negative for dysuria.  Musculoskeletal: Negative for arthralgias and back pain.  Skin: Positive for rash (abscess). Negative for color change.  Allergic/Immunologic: Negative for immunocompromised state.  Neurological: Negative for seizures and syncope.  Psychiatric/Behavioral: Negative for confusion.  All other systems reviewed and are negative.    Physical Exam Updated Vital Signs BP 132/68 (BP Location: Left Arm)   Pulse 87   Temp 98.8 F (37.1 C) (Oral)   Resp 16   SpO2 94%  Physical Exam  Constitutional: He is oriented to person, place, and time. He appears well-developed and well-nourished.  HENT:  Head: Normocephalic and atraumatic.  Eyes: Conjunctivae are normal.  Neck: Neck supple.  Cardiovascular: Normal rate and regular rhythm.  No murmur heard. Pulmonary/Chest: Effort normal and breath sounds normal. No respiratory distress.  Abdominal: Soft. There is no tenderness.  Genitourinary:  Genitourinary Comments: Perianal abscess at the 9 o'clock position w/erythema, fluctuance, & TTP  Musculoskeletal: Normal range of motion. He exhibits no edema.  Neurological: He is alert and oriented to person, place, and time.  Skin: Skin is warm and dry.  Psychiatric: He has a normal mood and affect.  Nursing note and vitals reviewed.    ED Treatments / Results  Labs (all labs ordered are listed, but only abnormal results are displayed) Labs Reviewed  COMPREHENSIVE METABOLIC PANEL - Abnormal; Notable for the following components:      Result Value   Calcium 8.8 (*)    ALT 10 (*)    All other components within normal limits  CBC WITH  DIFFERENTIAL/PLATELET - Abnormal; Notable for the following components:   WBC 14.0 (*)    Neutro Abs 9.1 (*)    Monocytes Absolute 1.5 (*)    All other components within normal limits  I-STAT CG4 LACTIC ACID, ED    EKG  EKG Interpretation None       Radiology No results found.  Procedures Procedures (including critical care time)  Medications Ordered in ED Medications - No data to display   Initial Impression / Assessment and Plan / ED Course  I have reviewed the triage vital signs and the nursing notes.  Pertinent labs & imaging results that were available during my care of the patient were reviewed by me and considered in my medical decision making (see chart for details).     Pt with recent dx of perirectal abscess presents with increased pain. Says he has been taking his antibiotics as prescribed, but his pain has worsened and he believes the abscess has gotten larger. Denies symptoms of systemic illness.  VS & exam as above. Labs remarkable for WBC 14.0, INR 1.10. NS bolus & analgesia provided.  CT pelvis shows increase in size of perirectal abscess to 5.1x4.1x3.3cm.  General Surgery consulted, evaluated the Pt in the ED; recommending Zosyn & admission to medicine given his comorbidities. Plan for surgery in the AM.  Will admit the Pt to the Hospitalist for further evaluation and treatment.  Final Clinical Impressions(s) / ED Diagnoses   Final diagnoses:  Perirectal abscess    ED Discharge Orders    None       Forest Becker, MD 06/04/17 2227    Charlynne Pander, MD 06/06/17 [phone]

## 2017-06-04 NOTE — Consult Note (Signed)
Reason for Consult:perirectal abscess Referring Physician: Dr. Reece Levy  [name] is an 61 y.o. male.  HPI: This gentleman presents with a perirectal abscess.  He has multiple comorbidities including coronary artery disease and COPD and is on Coumadin.  He initially presented on 12/23.  He had a CT scan which showed about a 1.5 cm perirectal abscess.  He was placed on a trial of oral antibiotics.  He returns now with much worse perirectal pain.  A CT scan was performed showing a 5 cm perirectal abscess.  Past Medical History:  Diagnosis Date  . CAD (coronary artery disease)    a. LHC 5/12:  LAD 20, pLCx 20, pRCA 40, dRCA 40, EF 35%, diff HK  //  b. Myoview 4/16: Overall Impression: High risk stress nuclear study There is no evidence of ischemia. There is severe LV dysfunction. LV Ejection Fraction: 30%. LV Wall Motion: There is global LV hypokinesis.   Marland Kitchen CAP (community acquired pneumonia) 09/2013  . Chronic combined systolic and diastolic CHF (congestive heart failure) (HCC)    a. Echo 4/16:Mild LVH, EF 40-45%, diffuse HK //  b. Echo 8/17: EF 35-40%, diffuse HK, diastolic dysfunction, aortic sclerosis, trivial MR, moderate LAE, normal RVSF, moderate RAE, mild TR, PASP 42 mmHg // c. Echo 4/18: Mild concentric LVH, EF 30-35, normal wall motion, grade 1 diastolic dysfunction, PASP 49  . Cluster headache    "hx; haven't had one in awhile" (01/09/2016)  . COPD (chronic obstructive pulmonary disease) ([place])    [name] 01/09/2016  . History of CVA (cerebrovascular accident)   . Hypertension   . NICM (nonischemic cardiomyopathy) ([place])   . Tobacco abuse     Past Surgical History:  Procedure Laterality Date  . CARDIAC CATHETERIZATION  10/2010   LM normal, LAD with 20% irregularities, LCX with 20%, RCA with 40% prox and 40% distal - EF of 35%  . COLONOSCOPY W/ BIOPSIES AND POLYPECTOMY    . EXCISION MASS HEAD    . VIDEO BRONCHOSCOPY Bilateral 05/08/2016   Procedure: VIDEO BRONCHOSCOPY WITH  FLUORO;  Surgeon: Rigoberto Noel, MD;  Location: [facility];  Service: Cardiopulmonary;  Laterality: Bilateral;    Family History  Problem Relation Age of Onset  . Heart disease Mother   . Diabetes Mother   . Colon cancer Mother   . Liver cancer Mother   . Cancer Father        type unknown  . Diabetes Sister        x 2  . Diabetes Brother     Social History:  reports that he has been smoking cigarettes.  He has a 10.50 pack-year smoking history. he has never used smokeless tobacco. He reports that he drinks alcohol. He reports that he does not use drugs.  Allergies:  Allergies  Allergen Reactions  . Lisinopril Cough    Medications: I have reviewed the patient's current medications.  Results for orders placed or performed during the hospital encounter of 06/04/17 (from the past 48 hour(s))  Comprehensive metabolic panel     Status: Abnormal   Collection Time: 06/04/17  4:54 PM  Result Value Ref Range   Sodium 138 135 - 145 mmol/L   Potassium 3.7 3.5 - 5.1 mmol/L   Chloride 101 101 - 111 mmol/L   CO2 27 22 - 32 mmol/L   Glucose, Bld 83 65 - 99 mg/dL   BUN 11 6 - 20 mg/dL   Creatinine, Ser 1.06 0.61 - 1.24 mg/dL   Calcium  8.8 (L) 8.9 - 10.3 mg/dL   Total Protein 7.0 6.5 - 8.1 g/dL   Albumin 3.5 3.5 - 5.0 g/dL   AST 20 15 - 41 U/L   ALT 10 (L) 17 - 63 U/L   Alkaline Phosphatase 63 38 - 126 U/L   Total Bilirubin 0.9 0.3 - 1.2 mg/dL   GFR calc non Af Amer >60 >60 mL/min   GFR calc Af Amer >60 >60 mL/min    Comment: (NOTE) The eGFR has been calculated using the CKD EPI equation. This calculation has not been validated in all clinical situations. eGFR's persistently <60 mL/min signify possible Chronic Kidney Disease.    Anion gap 10 5 - 15  CBC with Differential     Status: Abnormal   Collection Time: 06/04/17  4:54 PM  Result Value Ref Range   WBC 14.0 (H) 4.0 - 10.5 K/uL   RBC 4.56 4.22 - 5.81 MIL/uL   Hemoglobin 13.9 13.0 - 17.0 g/dL   HCT 41.9 39.0 - 52.0 %    MCV 91.9 78.0 - 100.0 fL   MCH 30.5 26.0 - 34.0 pg   MCHC 33.2 30.0 - 36.0 g/dL   RDW 13.1 11.5 - 15.5 %   Platelets 197 150 - 400 K/uL   Neutrophils Relative % 66 %   Neutro Abs 9.1 (H) 1.7 - 7.7 K/uL   Lymphocytes Relative 23 %   Lymphs Abs 3.2 0.7 - 4.0 K/uL   Monocytes Relative 11 %   Monocytes Absolute 1.5 (H) 0.1 - 1.0 K/uL   Eosinophils Relative 0 %   Eosinophils Absolute 0.1 0.0 - 0.7 K/uL   Basophils Relative 0 %   Basophils Absolute 0.0 0.0 - 0.1 K/uL  I-Stat CG4 Lactic Acid, ED     Status: None   Collection Time: 06/04/17  5:09 PM  Result Value Ref Range   Lactic Acid, Venous 1.54 0.5 - 1.9 mmol/L  Protime-INR     Status: None   Collection Time: 06/04/17  8:36 PM  Result Value Ref Range   Prothrombin Time 14.1 11.4 - 15.2 seconds   INR 1.10     Ct Pelvis W Contrast  Result Date: 06/04/2017 CLINICAL DATA:  Rectal abscess. EXAM: CT PELVIS WITH CONTRAST TECHNIQUE: Multidetector CT imaging of the pelvis was performed using the standard protocol following the bolus administration of intravenous contrast. CONTRAST:  100 mL ISOVUE-300 IOPAMIDOL (ISOVUE-300) INJECTION 61% COMPARISON:  CT scan of June 02, 2017. FINDINGS: Urinary Tract:  No abnormality visualized. Bowel:  Unremarkable visualized pelvic bowel loops. Vascular/Lymphatic: No pathologically enlarged lymph nodes. No significant vascular abnormality seen. Reproductive:  No mass or other significant abnormality Other: 5.1 x 4.1 x 3.3 cm fluid collection is seen in the left perirectal region which is significantly enlarged compared to prior exam. This is consistent with abscess. Musculoskeletal: No suspicious bone lesions identified. IMPRESSION: 5.1 x 4.1 x 3.3 cm left perirectal abscess is noted which is significantly enlarged compared to prior exam. Electronically Signed   By: Marijo Conception, M.D.   On: 06/04/2017 21:29    Review of Systems  All other systems reviewed and are negative.  Blood pressure (!) 144/79,  pulse 80, temperature 98.8 F (37.1 C), temperature source Oral, resp. rate 16, SpO2 98 %. Physical Exam  Constitutional: He is oriented to person, place, and time. He appears well-developed and well-nourished. He appears distressed.  HENT:  Head: Normocephalic and atraumatic.  Right Ear: External ear normal.  Left Ear: External ear  normal.  Eyes: Pupils are equal, round, and reactive to light. No scleral icterus.  Cardiovascular: Normal rate, regular rhythm and normal heart sounds.  Respiratory: Effort normal and breath sounds normal. No respiratory distress.  GI: Soft. There is no tenderness.  Genitourinary:  Genitourinary Comments: There is a tender perirectal abscess on the left perianal area.  Musculoskeletal: Normal range of motion.  Neurological: He is alert and oriented to person, place, and time.  Skin: Skin is warm and dry.  Psychiatric: His behavior is normal. Judgment normal.    Assessment/Plan: Perirectal abscess  He has too much tenderness and refuses bedside incision and drainage.  This would need to be drained in the operating room under anesthesia tomorrow.  His Coumadin needs to be held.  Currently, his INR is normal.  He will need cardiopulmonary clearance for potential general anesthetic.  We will keep him n.p.o. I have discussed this with the patient who is in agreement with the plan  Regis Hinton A 06/04/2017, 11:21 PM

## 2017-06-04 NOTE — H&P (Signed)
History and Physical    Jared [name] WUJ:[id number] DOB: 11/17/1955 DOA: 06/04/2017  PCP: Jaclyn Shaggy, MD  Patient coming from: Home.  Chief Complaint: Perirectal pain.  HPI: Jared [name] is a 61 y.o. male with history of systolic CHF, hypertension, CAD, paroxysmal atrial fibrillation presents to the ER with complaints of worsening perirectal pain.  Patient started having the symptoms last week.  Had come to the ER 3 days ago and had CT scan done which showed perirectal abscess measuring around 1.5 cm and was discharged home on oral antibiotic despite taking which patient's pain worsened and patient came back to the ER.  Denies any nausea vomiting or diarrhea.  Denies any chest pain or shortness of breath.  Has not had any fever or chills.  ED Course: In the ER patient had a repeat CT pelvis which shows increasing size of the perirectal abscess measuring now 5.1 x 4.1 x 3.3 cm.  On-call general surgeon Dr. Magnus Ivan has been consulted patient started on Zosyn and admitted for possible incision and drainage.  Review of Systems: As per HPI, rest all negative.   Past Medical History:  Diagnosis Date  . CAD (coronary artery disease)    a. LHC 5/12:  LAD 20, pLCx 20, pRCA 40, dRCA 40, EF 35%, diff HK  //  b. Myoview 4/16: Overall Impression: High risk stress nuclear study There is no evidence of ischemia. There is severe LV dysfunction. LV Ejection Fraction: 30%. LV Wall Motion: There is global LV hypokinesis.   Marland Kitchen CAP (community acquired pneumonia) 09/2013  . Chronic combined systolic and diastolic CHF (congestive heart failure) (HCC)    a. Echo 4/16:Mild LVH, EF 40-45%, diffuse HK //  b. Echo 8/17: EF 35-40%, diffuse HK, diastolic dysfunction, aortic sclerosis, trivial MR, moderate LAE, normal RVSF, moderate RAE, mild TR, PASP 42 mmHg // c. Echo 4/18: Mild concentric LVH, EF 30-35, normal wall motion, grade 1 diastolic dysfunction, PASP 49  . Cluster headache    "hx; haven't had one in  awhile" (01/09/2016)  . COPD (chronic obstructive pulmonary disease) (HCC)    [name] 01/09/2016  . History of CVA (cerebrovascular accident)   . Hypertension   . NICM (nonischemic cardiomyopathy) (HCC)   . Tobacco abuse     Past Surgical History:  Procedure Laterality Date  . CARDIAC CATHETERIZATION  10/2010   LM normal, LAD with 20% irregularities, LCX with 20%, RCA with 40% prox and 40% distal - EF of 35%  . COLONOSCOPY W/ BIOPSIES AND POLYPECTOMY    . EXCISION MASS HEAD    . VIDEO BRONCHOSCOPY Bilateral 05/08/2016   Procedure: VIDEO BRONCHOSCOPY WITH FLUORO;  Surgeon: Oretha Milch, MD;  Location: [facility] ENDOSCOPY;  Service: Cardiopulmonary;  Laterality: Bilateral;     reports that he has been smoking cigarettes.  He has a 10.50 pack-year smoking history. he has never used smokeless tobacco. He reports that he drinks alcohol. He reports that he does not use drugs.  Allergies  Allergen Reactions  . Lisinopril Cough    Family History  Problem Relation Age of Onset  . Heart disease Mother   . Diabetes Mother   . Colon cancer Mother   . Liver cancer Mother   . Cancer Father        type unknown  . Diabetes Sister        x 2  . Diabetes Brother     Prior to Admission medications   Medication Sig Start Date End Date Taking? Authorizing Provider  albuterol (PROVENTIL HFA;VENTOLIN HFA) 108 (90 Base) MCG/ACT inhaler Inhale 2 puffs into the lungs every 6 (six) hours as needed for wheezing or shortness of breath. 11/20/16   Jaclyn ShaggyAmao, Enobong, MD  bisoprolol (ZEBETA) 5 MG tablet Take 1 tablet (5 mg total) by mouth daily. 11/21/16   Tereso NewcomerWeaver, Scott T, PA-C  docusate sodium (COLACE) 100 MG capsule Take 1 capsule (100 mg total) by mouth every 12 (twelve) hours. 06/02/17   Roxy HorsemanBrowning, Robert, PA-C  doxycycline (VIBRAMYCIN) 100 MG capsule Take 1 capsule (100 mg total) by mouth 2 (two) times daily. 06/02/17   Roxy HorsemanBrowning, Robert, PA-C  furosemide (LASIX) 40 MG tablet Take 1 tablet (40 mg total) by mouth  daily. 11/21/16   Tereso NewcomerWeaver, Scott T, PA-C  Glycopyrrolate-Formoterol (BEVESPI AEROSPHERE) 9-4.8 MCG/ACT AERO Inhale 2 puffs into the lungs 2 (two) times daily. 08/23/16   Nyoka CowdenWert, Michael B, MD  HYDROcodone-acetaminophen (NORCO/VICODIN) 5-325 MG tablet Take 1-2 tablets by mouth every 6 (six) hours as needed. 06/02/17   Roxy HorsemanBrowning, Robert, PA-C  hyoscyamine (LEVSIN SL) 0.125 MG SL tablet Place 1 tablet (0.125 mg total) under the tongue every 8 (eight) hours as needed. 08/08/16   Zehr, Princella PellegriniJessica D, PA-C  OXYGEN 2lpm when needed per pt    [provider]  polyethylene glycol powder (GLYCOLAX/MIRALAX) powder Take 17 g by mouth 2 (two) times daily. Until daily soft stools  OTC 06/02/17   Roxy HorsemanBrowning, Robert, PA-C  sacubitril-valsartan (ENTRESTO) 24-26 MG Take 1 tablet by mouth 2 (two) times daily. 09/21/16   Jaclyn ShaggyAmao, Enobong, MD  spironolactone (ALDACTONE) 25 MG tablet Take 0.5 tablets (12.5 mg total) by mouth daily. 02/01/17   Pricilla Riffleoss, Paula V, MD  warfarin (COUMADIN) 7.5 MG tablet Take as directed by coumadin clinic 11/21/16   Pricilla Riffleoss, Paula V, MD    Physical Exam: Vitals:   06/04/17 1605 06/04/17 1844 06/04/17 2030  BP: (!) 133/93 132/68 (!) 144/79  Pulse: 60 87 80  Resp: 18 16   Temp: 98.8 F (37.1 C)    TempSrc: Oral    SpO2: 99% 94% 98%      Constitutional: Moderately built and nourished. Vitals:   06/04/17 1605 06/04/17 1844 06/04/17 2030  BP: (!) 133/93 132/68 (!) 144/79  Pulse: 60 87 80  Resp: 18 16   Temp: 98.8 F (37.1 C)    TempSrc: Oral    SpO2: 99% 94% 98%   Eyes: Anicteric no pallor. ENMT: No discharge from the ears eyes nose or mouth. Neck: No mass felt.  No neck rigidity. Respiratory: No rhonchi or crepitations. Cardiovascular: S1-S2 heard no murmurs appreciated. Abdomen: Soft nontender bowel sounds present. Musculoskeletal: No edema.  No joint effusion. Skin: No rash. Neurologic: Alert awake oriented to time place and person.  Moves all extremities. Psychiatric: Appears  normal.  Normal affect.   Labs on Admission: I have personally reviewed following labs and imaging studies  CBC: Recent Labs  Lab 06/02/17 0050 06/04/17 1654  WBC 11.5* 14.0*  NEUTROABS 6.4 9.1*  HGB 13.8 13.9  HCT 42.5 41.9  MCV 91.8 91.9  PLT 199 197   Basic Metabolic Panel: Recent Labs  Lab 06/02/17 0050 06/04/17 1654  NA 139 138  K 3.8 3.7  CL 104 101  CO2 27 27  GLUCOSE 93 83  BUN 16 11  CREATININE 1.00 1.06  CALCIUM 8.8* 8.8*   GFR: Estimated Creatinine Clearance: 85.3 mL/min (by C-G formula based on SCr of 1.06 mg/dL). Liver Function Tests: Recent Labs  Lab 06/04/17 1654  AST  20  ALT 10*  ALKPHOS 63  BILITOT 0.9  PROT 7.0  ALBUMIN 3.5   No results for input(s): LIPASE, AMYLASE in the last 168 hours. No results for input(s): AMMONIA in the last 168 hours. Coagulation Profile: Recent Labs  Lab 06/04/17 2036  INR 1.10   Cardiac Enzymes: No results for input(s): CKTOTAL, CKMB, CKMBINDEX, TROPONINI in the last 168 hours. BNP (last 3 results) Recent Labs    08/03/16 1330  PROBNP 2,541*   HbA1C: No results for input(s): HGBA1C in the last 72 hours. CBG: No results for input(s): GLUCAP in the last 168 hours. Lipid Profile: No results for input(s): CHOL, HDL, LDLCALC, TRIG, CHOLHDL, LDLDIRECT in the last 72 hours. Thyroid Function Tests: No results for input(s): TSH, T4TOTAL, FREET4, T3FREE, THYROIDAB in the last 72 hours. Anemia Panel: No results for input(s): VITAMINB12, FOLATE, FERRITIN, TIBC, IRON, RETICCTPCT in the last 72 hours. Urine analysis:    Component Value Date/Time   COLORURINE YELLOW 07/25/2016 1845   APPEARANCEUR HAZY (A) 07/25/2016 1845   LABSPEC 1.017 07/25/2016 1845   PHURINE 5.0 07/25/2016 1845   GLUCOSEU 50 (A) 07/25/2016 1845   HGBUR SMALL (A) 07/25/2016 1845   BILIRUBINUR NEGATIVE 07/25/2016 1845   KETONESUR NEGATIVE 07/25/2016 1845   PROTEINUR 100 (A) 07/25/2016 1845   NITRITE NEGATIVE 07/25/2016 1845    LEUKOCYTESUR NEGATIVE 07/25/2016 1845   Sepsis Labs: @LABRCNTIP (procalcitonin:4,lacticidven:4) )No results found for this or any previous visit (from the past 240 hour(s)).   Radiological Exams on Admission: Ct Pelvis W Contrast  Result Date: 06/04/2017 CLINICAL DATA:  Rectal abscess. EXAM: CT PELVIS WITH CONTRAST TECHNIQUE: Multidetector CT imaging of the pelvis was performed using the standard protocol following the bolus administration of intravenous contrast. CONTRAST:  100 mL ISOVUE-300 IOPAMIDOL (ISOVUE-300) INJECTION 61% COMPARISON:  CT scan of June 02, 2017. FINDINGS: Urinary Tract:  No abnormality visualized. Bowel:  Unremarkable visualized pelvic bowel loops. Vascular/Lymphatic: No pathologically enlarged lymph nodes. No significant vascular abnormality seen. Reproductive:  No mass or other significant abnormality Other: 5.1 x 4.1 x 3.3 cm fluid collection is seen in the left perirectal region which is significantly enlarged compared to prior exam. This is consistent with abscess. Musculoskeletal: No suspicious bone lesions identified. IMPRESSION: 5.1 x 4.1 x 3.3 cm left perirectal abscess is noted which is significantly enlarged compared to prior exam. Electronically Signed   By: Lupita Raider, M.D.   On: 06/04/2017 21:29    Assessment/Plan Principal Problem:   Perirectal abscess Active Problems:   CAD (coronary artery disease)   Nonischemic dilated cardiomyopathy (HCC)   COPD GOLD III/still smoking    Chronic combined systolic and diastolic CHF (congestive heart failure) (HCC)   Atrial flutter (HCC)    1. Perirectal abscess -patient has been started on Zosyn appreciate general surgery consult.  General surgery is requested cardiology evaluation for surgical clearance.  Patient will be kept n.p.o. in anticipation of surgery. 2. Chronic systolic heart failure last EF measured was 30-35% in April 2018 -appears compensated.  Presently Lasix, Entresto and spironolactone on hold  for surgery. 3. History of CAD -denies any chest pain. 4. History of hypertension -patient's Entresto and Bystolic on hold.  I have placed patient on scheduled dose of IV metoprolol with as needed IV hydralazine. 5. COPD -not actively wheezing. 6. A. Fib -  presently rate controlled.  EKG is pending.  On IV metoprolol.  Patient does take Coumadin but INR is subtherapeutic.   DVT prophylaxis: SCDs. Code Status: Full  code. Family Communication: Discussed with patient. Disposition Plan: Home. Consults called: General surgery. Admission status: Inpatient.   Eduard Clos MD [facility] Pager [phone].  If 7PM-7AM, please contact night-coverage [email or web address] Password [facility]  06/04/2017, 11:16 PM

## 2017-06-04 NOTE — ED Triage Notes (Addendum)
Pt with c/o severe pain in rectal area and left buttock, was seen here on the 22nd for same, given rx for antibiotics and pain meds. Hurts to stand, sit, all the time. Pt dx with perirectal abscess - told to return if worse

## 2017-06-05 ENCOUNTER — Inpatient Hospital Stay (HOSPITAL_COMMUNITY): Payer: Self-pay | Admitting: Anesthesiology

## 2017-06-05 ENCOUNTER — Encounter (HOSPITAL_COMMUNITY)
Admission: EM | Disposition: A | Discharge status: Home / Self Care | Payer: Self-pay | Source: Home / Self Care | Attending: Internal Medicine

## 2017-06-05 ENCOUNTER — Other Ambulatory Visit: Payer: Self-pay

## 2017-06-05 ENCOUNTER — Encounter (HOSPITAL_COMMUNITY): Payer: Self-pay | Admitting: Surgery

## 2017-06-05 DIAGNOSIS — I4892 Unspecified atrial flutter: Secondary | ICD-10-CM

## 2017-06-05 DIAGNOSIS — J449 Chronic obstructive pulmonary disease, unspecified: Secondary | ICD-10-CM

## 2017-06-05 DIAGNOSIS — I420 Dilated cardiomyopathy: Secondary | ICD-10-CM

## 2017-06-05 DIAGNOSIS — I2510 Atherosclerotic heart disease of native coronary artery without angina pectoris: Secondary | ICD-10-CM

## 2017-06-05 HISTORY — PX: INCISION AND DRAINAGE PERIRECTAL ABSCESS: SHX1804

## 2017-06-05 LAB — CBC
HCT: 40.4 % (ref 39.0–52.0)
Hemoglobin: 13.2 g/dL (ref 13.0–17.0)
MCH: 29.9 pg (ref 26.0–34.0)
MCHC: 32.7 g/dL (ref 30.0–36.0)
MCV: 91.4 fL (ref 78.0–100.0)
Platelets: 209 10*3/uL (ref 150–400)
RBC: 4.42 MIL/uL (ref 4.22–5.81)
RDW: 13.1 % (ref 11.5–15.5)
WBC: 14.3 10*3/uL — ABNORMAL HIGH (ref 4.0–10.5)

## 2017-06-05 LAB — BASIC METABOLIC PANEL
Anion gap: 9 (ref 5–15)
BUN: 9 mg/dL (ref 6–20)
CO2: 27 mmol/L (ref 22–32)
Calcium: 8.7 mg/dL — ABNORMAL LOW (ref 8.9–10.3)
Chloride: 101 mmol/L (ref 101–111)
Creatinine, Ser: 1.06 mg/dL (ref 0.61–1.24)
GFR calc Af Amer: >60 mL/min (ref >60)
GFR calc non Af Amer: >60 mL/min (ref >60)
Glucose, Bld: 86 mg/dL (ref 65–99)
Potassium: 3.5 mmol/L (ref 3.5–5.1)
Sodium: 137 mmol/L (ref 135–145)

## 2017-06-05 LAB — HIV ANTIBODY (ROUTINE TESTING W REFLEX): HIV Screen 4th Generation wRfx: NONREACTIVE

## 2017-06-05 LAB — PROTIME-INR
INR: 1.1
Prothrombin Time: 14.1 seconds (ref 11.4–15.2)

## 2017-06-05 LAB — GLUCOSE, CAPILLARY
Glucose-Capillary: 84 mg/dL (ref 65–99)
Glucose-Capillary: 64 mg/dL — ABNORMAL LOW (ref 65–99)
Glucose-Capillary: 179 mg/dL — ABNORMAL HIGH (ref 65–99)
Glucose-Capillary: 132 mg/dL — ABNORMAL HIGH (ref 65–99)
Glucose-Capillary: 175 mg/dL — ABNORMAL HIGH (ref 65–99)

## 2017-06-05 LAB — TYPE AND SCREEN
ABO/RH(D): O POS
Antibody Screen: NEGATIVE

## 2017-06-05 LAB — SURGICAL PCR SCREEN
MRSA, PCR: NEGATIVE
Staphylococcus aureus: NEGATIVE

## 2017-06-05 SURGERY — INCISION AND DRAINAGE, ABSCESS, PERIRECTAL
Anesthesia: General | Site: Anus

## 2017-06-05 MED ORDER — FUROSEMIDE 40 MG PO TABS: 40.0000 mg | ORAL_TABLET | Freq: Every day | ORAL | Status: DC

## 2017-06-05 MED ORDER — LACTATED RINGERS IV SOLN
INTRAVENOUS | Status: DC
  Administered 2017-06-05 (×2): via INTRAVENOUS

## 2017-06-05 MED ORDER — BISOPROLOL FUMARATE 5 MG PO TABS
5.0000 mg | ORAL_TABLET | Freq: Every day | ORAL | Status: DC
  Administered 2017-06-06: 5 mg via ORAL
  Filled 2017-06-05: qty 1

## 2017-06-05 MED ORDER — 0.9 % SODIUM CHLORIDE (POUR BTL) OPTIME
TOPICAL | Status: DC | PRN
  Administered 2017-06-05: 1000 mL

## 2017-06-05 MED ORDER — MIDAZOLAM HCL 2 MG/2ML IJ SOLN: 0.5000 mg | Freq: Once | INTRAMUSCULAR | Status: DC | PRN

## 2017-06-05 MED ORDER — HYDROMORPHONE HCL 1 MG/ML IJ SOLN: 0.2500 mg | INTRAMUSCULAR | Status: DC | PRN

## 2017-06-05 MED ORDER — PROPOFOL 10 MG/ML IV BOLUS
INTRAVENOUS | Status: AC
  Filled 2017-06-05: qty 20

## 2017-06-05 MED ORDER — FENTANYL CITRATE (PF) 250 MCG/5ML IJ SOLN
INTRAMUSCULAR | Status: AC
  Filled 2017-06-05: qty 5

## 2017-06-05 MED ORDER — SACUBITRIL-VALSARTAN 24-26 MG PO TABS
1.0000 | ORAL_TABLET | Freq: Two times a day (BID) | ORAL | Status: DC
  Administered 2017-06-06: 1 via ORAL
  Filled 2017-06-05: qty 1

## 2017-06-05 MED ORDER — MIDAZOLAM HCL 2 MG/2ML IJ SOLN
INTRAMUSCULAR | Status: AC
Start: 2017-06-05 — End: 2017-06-05
  Filled 2017-06-05: qty 2

## 2017-06-05 MED ORDER — DOCUSATE SODIUM 100 MG PO CAPS
100.0000 mg | ORAL_CAPSULE | Freq: Two times a day (BID) | ORAL | Status: DC
  Administered 2017-06-05: 100 mg via ORAL
  Filled 2017-06-05 (×2): qty 1

## 2017-06-05 MED ORDER — DEXAMETHASONE SODIUM PHOSPHATE 10 MG/ML IJ SOLN
INTRAMUSCULAR | Status: DC | PRN
  Administered 2017-06-05: 10 mg via INTRAVENOUS

## 2017-06-05 MED ORDER — ALBUTEROL SULFATE HFA 108 (90 BASE) MCG/ACT IN AERS: 2.0000 | INHALATION_SPRAY | Freq: Four times a day (QID) | RESPIRATORY_TRACT | Status: DC | PRN

## 2017-06-05 MED ORDER — ROCURONIUM BROMIDE 100 MG/10ML IV SOLN
INTRAVENOUS | Status: DC | PRN
  Administered 2017-06-05: 40 mg via INTRAVENOUS

## 2017-06-05 MED ORDER — PROPOFOL 10 MG/ML IV BOLUS
INTRAVENOUS | Status: DC | PRN
  Administered 2017-06-05: 80 mg via INTRAVENOUS

## 2017-06-05 MED ORDER — MIDAZOLAM HCL 5 MG/5ML IJ SOLN
INTRAMUSCULAR | Status: DC | PRN
  Administered 2017-06-05: 2 mg via INTRAVENOUS

## 2017-06-05 MED ORDER — MEPERIDINE HCL 25 MG/ML IJ SOLN: 6.2500 mg | INTRAMUSCULAR | Status: DC | PRN

## 2017-06-05 MED ORDER — PROMETHAZINE HCL 25 MG/ML IJ SOLN: 6.2500 mg | INTRAMUSCULAR | Status: DC | PRN

## 2017-06-05 MED ORDER — LIDOCAINE HCL (CARDIAC) 20 MG/ML IV SOLN
INTRAVENOUS | Status: DC | PRN
  Administered 2017-06-05: 60 mg via INTRATRACHEAL

## 2017-06-05 MED ORDER — OXYCODONE-ACETAMINOPHEN 5-325 MG PO TABS
1.0000 | ORAL_TABLET | ORAL | Status: DC | PRN
  Administered 2017-06-05 (×2): 2 via ORAL
  Filled 2017-06-05 (×2): qty 2

## 2017-06-05 MED ORDER — FUROSEMIDE 40 MG PO TABS
40.0000 mg | ORAL_TABLET | Freq: Every day | ORAL | Status: DC
  Administered 2017-06-06: 40 mg via ORAL
  Filled 2017-06-05: qty 1

## 2017-06-05 MED ORDER — FENTANYL CITRATE (PF) 250 MCG/5ML IJ SOLN
INTRAMUSCULAR | Status: DC | PRN
  Administered 2017-06-05: 50 ug via INTRAVENOUS
  Administered 2017-06-05: 100 ug via INTRAVENOUS

## 2017-06-05 MED ORDER — DEXTROSE 50 % IV SOLN: 50.0000 mL | Freq: Once | INTRAVENOUS | Status: DC

## 2017-06-05 MED ORDER — SUGAMMADEX SODIUM 200 MG/2ML IV SOLN
INTRAVENOUS | Status: DC | PRN
  Administered 2017-06-05: 200 mg via INTRAVENOUS

## 2017-06-05 MED ORDER — BISOPROLOL FUMARATE 5 MG PO TABS: 5.0000 mg | ORAL_TABLET | Freq: Every day | ORAL | Status: DC

## 2017-06-05 MED ORDER — ONDANSETRON HCL 4 MG/2ML IJ SOLN
INTRAMUSCULAR | Status: DC | PRN
  Administered 2017-06-05: 4 mg via INTRAVENOUS

## 2017-06-05 MED ORDER — DEXTROSE 50 % IV SOLN
INTRAVENOUS | Status: AC
  Administered 2017-06-05: 50 mL
  Filled 2017-06-05: qty 50

## 2017-06-05 SURGICAL SUPPLY — 31 items
BNDG GAUZE ELAST 4 BULKY (GAUZE/BANDAGES/DRESSINGS) ×2 IMPLANT
CANISTER SUCT 3000ML PPV (MISCELLANEOUS) ×2 IMPLANT
COVER SURGICAL LIGHT HANDLE (MISCELLANEOUS) ×2 IMPLANT
DRSG PAD ABDOMINAL 8X10 ST (GAUZE/BANDAGES/DRESSINGS) IMPLANT
ELECT CAUTERY BLADE 6.4 (BLADE) ×2 IMPLANT
ELECT REM PT RETURN 9FT ADLT (ELECTROSURGICAL) ×2
ELECTRODE REM PT RTRN 9FT ADLT (ELECTROSURGICAL) ×1 IMPLANT
GAUZE SPONGE 4X4 12PLY STRL (GAUZE/BANDAGES/DRESSINGS) IMPLANT
GAUZE SPONGE 4X4 12PLY STRL LF (GAUZE/BANDAGES/DRESSINGS) ×2 IMPLANT
GLOVE BIOGEL M STRL SZ7.5 (GLOVE) IMPLANT
GLOVE BIOGEL PI IND STRL 8 (GLOVE) ×1 IMPLANT
GLOVE BIOGEL PI INDICATOR 8 (GLOVE) ×1
GOWN STRL REUS W/ TWL LRG LVL3 (GOWN DISPOSABLE) ×1 IMPLANT
GOWN STRL REUS W/ TWL XL LVL3 (GOWN DISPOSABLE) ×1 IMPLANT
GOWN STRL REUS W/TWL LRG LVL3 (GOWN DISPOSABLE) ×1
GOWN STRL REUS W/TWL XL LVL3 (GOWN DISPOSABLE) ×1
KIT BASIN OR (CUSTOM PROCEDURE TRAY) ×2 IMPLANT
KIT ROOM TURNOVER OR (KITS) ×2 IMPLANT
NS IRRIG 1000ML POUR BTL (IV SOLUTION) ×2 IMPLANT
PACK LITHOTOMY IV (CUSTOM PROCEDURE TRAY) ×2 IMPLANT
PAD ABD 8X10 STRL (GAUZE/BANDAGES/DRESSINGS) ×2 IMPLANT
PAD ARMBOARD 7.5X6 YLW CONV (MISCELLANEOUS) ×2 IMPLANT
PENCIL BUTTON HOLSTER BLD 10FT (ELECTRODE) ×2 IMPLANT
SPONGE LAP 18X18 X RAY DECT (DISPOSABLE) ×2 IMPLANT
SWAB COLLECTION DEVICE MRSA (MISCELLANEOUS) IMPLANT
SWAB CULTURE ESWAB REG 1ML (MISCELLANEOUS) IMPLANT
TOWEL OR 17X24 6PK STRL BLUE (TOWEL DISPOSABLE) ×2 IMPLANT
TOWEL OR 17X26 10 PK STRL BLUE (TOWEL DISPOSABLE) ×2 IMPLANT
TUBE CONNECTING 12X1/4 (SUCTIONS) ×2 IMPLANT
UNDERPAD 30X30 INCONTINENT (UNDERPADS AND DIAPERS) ×2 IMPLANT
YANKAUER SUCT BULB TIP NO VENT (SUCTIONS) ×2 IMPLANT

## 2017-06-05 NOTE — Progress Notes (Signed)
Subjective No acute events. Still with perirectal/left buttock pain  Objective: Vital signs in last 24 hours: Temp:  [98.2 F (36.8 C)-99.7 F (37.6 C)] 98.2 F (36.8 C) (12/26 0519) Pulse Rate:  [60-88] 69 (12/26 0519) Resp:  [10-21] 18 (12/26 0519) BP: (127-168)/(68-93) 127/73 (12/26 0519) SpO2:  [93 %-99 %] 93 % (12/26 0519) Weight:  [87.4 kg (192 lb 10.9 oz)] 87.4 kg (192 lb 10.9 oz) (12/26 0024) Last BM Date: 06/04/17  Intake/Output from previous day: 12/25 0701 - 12/26 0700 In: 1000 [I.V.:1000] Out: 250 [Urine:250] Intake/Output this shift: No intake/output data recorded.  Gen: NAD, comfortable CV: RRR Pulm: Normal work of breathing Abd: Soft, NT/ND Buttock: tender area with some fluctuance in left buttock/perianal region. Ext: SCDs in place  Lab Results: CBC  Recent Labs    06/04/17 1654 06/05/17 0344  WBC 14.0* 14.3*  HGB 13.9 13.2  HCT 41.9 40.4  PLT 197 209   BMET Recent Labs    06/04/17 1654 06/05/17 0344  NA 138 137  K 3.7 3.5  CL 101 101  CO2 27 27  GLUCOSE 83 86  BUN 11 9  CREATININE 1.06 1.06  CALCIUM 8.8* 8.7*   PT/INR Recent Labs    06/04/17 2036 06/05/17 0636  LABPROT 14.1 14.1  INR 1.10 1.10   ABG No results for input(s): PHART, HCO3 in the last 72 hours.  Invalid input(s): PCO2, PO2  Studies/Results:  Anti-infectives: Anti-infectives (From admission, onward)   Start     Dose/Rate Route Frequency Ordered Stop   06/05/17 0600  piperacillin-tazobactam (ZOSYN) IVPB 3.375 g     3.375 g 12.5 mL/hr over 240 Minutes Intravenous Every 8 hours 06/04/17 2317     06/04/17 2230  piperacillin-tazobactam (ZOSYN) IVPB 3.375 g     3.375 g 100 mL/hr over 30 Minutes Intravenous  Once 06/04/17 2216 06/04/17 2339       Assessment/Plan: Patient Active Problem List   Diagnosis Date Noted  . Perirectal abscess 06/04/2017  . Elevated LFTs 08/13/2016  . Periumbilical abdominal pain 08/13/2016  . Chronic respiratory failure with  hypoxia (HCC) 07/24/2016  . Encounter for therapeutic drug monitoring 07/11/2016  . Atrial flutter (HCC)   . Hepatic congestion 07/02/2016  . Chronic combined systolic and diastolic CHF (congestive heart failure) (HCC) 07/01/2016  . COPD exacerbation (HCC) 06/03/2016  . Prediabetes 05/14/2016  . Hemoptysis 05/06/2016  . COPD GOLD III/still smoking  02/29/2016  . Cigarette smoker 01/09/2016  . CAD (coronary artery disease) 10/07/2013  . 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 Centrilobular emphysema (HCC) 10/04/2013  . Pulmonary infiltrate in right lung on CXR 10/03/2013  . Essential hypertension    PLAN -NPO since yesterday -OR today for anal exam under anesthesia; incision and drainage of perirectal abscess; possible seton; all other indicated procedures -The anatomy and physiology of the GI tract and pathophysiology of anal abscesses was elaborated. The treatment approach was also discussed. The planned procedure, material risks (including, but not limited to, pain, bleeding, infection, need for additional procedures, recurrence of abscess, damage to surrounding structures/vessels/nerves, heart attack, stroke, death) benefits and alternatives were discussed at length. His questions were answered, he voiced understanding and elected to proceed with surgery.   LOS: 1 day   Stephanie Coup. Cliffton Asters, M.D. General and Colorectal Surgery [facility]

## 2017-06-05 NOTE — Progress Notes (Signed)
Patient back from OR. Alert and oriented, no complaints of pain. Dressing on perianal area clean, dry, intact. Will continue to monitor.

## 2017-06-05 NOTE — Progress Notes (Signed)
Patient admitted to room 5w30 from ED, with worse pain from perirectal abcess. Perirectal site swollen and tender to touch, skin intact. Patient alert/oriented. Pain 9/10 notified team. Will continue to monitor.

## 2017-06-05 NOTE — Anesthesia Procedure Notes (Signed)
Procedure Name: Intubation Date/Time: 06/05/2017 10:43 AM Performed by: Marena Chancy, CRNA Pre-anesthesia Checklist: Patient identified, Emergency Drugs available, Suction available and Patient being monitored Patient Re-evaluated:Patient Re-evaluated prior to induction Oxygen Delivery Method: Circle System Utilized Preoxygenation: Pre-oxygenation with 100% oxygen Induction Type: IV induction Ventilation: Mask ventilation without difficulty Laryngoscope Size: Miller and 3 Grade View: Grade I Tube type: Oral Tube size: 8.0 mm Number of attempts: 1 Airway Equipment and Method: Stylet and Oral airway Placement Confirmation: ETT inserted through vocal cords under direct vision,  positive ETCO2 and breath sounds checked- equal and bilateral Tube secured with: Tape Dental Injury: Teeth and Oropharynx as per pre-operative assessment

## 2017-06-05 NOTE — Evaluation (Signed)
Physical Therapy Evaluation Patient Details Name: Jared DageJohnny Rodrigue MRN: [id number] DOB: 06/13/1955 Today's Date: 06/05/2017   History of Present Illness  Jared [name] is a 61 y.o. male with history of systolic CHF, hypertension, CAD, paroxysmal atrial fibrillation presents to the ER with complaints of worsening perirectal pain.  s/p I and D of perirectal abscess 12/26.  Clinical Impression  Pt is close to baseline functioning and should be safe at home . There are no further acute PT needs.  Will sign off at this time.     Follow Up Recommendations No PT follow up    Equipment Recommendations  None recommended by PT    Recommendations for Other Services       Precautions / Restrictions Precautions Precautions: None      Mobility  Bed Mobility Overal bed mobility: Modified Independent             General bed mobility comments: slow and tentative, but no assist needed  Transfers Overall transfer level: Independent   Transfers: Sit to/from Stand Sit to Stand: Independent            Ambulation/Gait Ambulation/Gait assistance: Independent Ambulation Distance (Feet): 300 Feet Assistive device: None Gait Pattern/deviations: Step-through pattern;WFL(Within Functional Limits)   Gait velocity interpretation: at or above normal speed for age/gender General Gait Details: steady gait.  able to scan, turn/change direction abruptly and moves at a fast age appropriate speed.  Stairs Stairs: Yes Stairs assistance: Independent Stair Management: One rail Right;Alternating pattern;Forwards Number of Stairs: 3 General stair comments: did not need the rail  Wheelchair Mobility    Modified Rankin (Stroke Patients Only)       Balance Overall balance assessment: No apparent balance deficits (not formally assessed)                                           Pertinent Vitals/Pain Pain Assessment: Faces Faces Pain Scale: Hurts a little bit Pain Location:  buttock Pain Descriptors / Indicators: Guarding;Operative site guarding Pain Intervention(s): Monitored during session    Home Living Family/patient expects to be discharged to:: Private residence Living Arrangements: Alone Available Help at Discharge: Family;Available PRN/intermittently Type of Home: Group Home Home Access: Stairs to enter Entrance Stairs-Rails: Can reach both Entrance Stairs-Number of Steps: 3 Home Layout: One level Home Equipment: None      Prior Function Level of Independence: Independent               Hand Dominance        Extremity/Trunk Assessment   Upper Extremity Assessment Upper Extremity Assessment: Overall WFL for tasks assessed    Lower Extremity Assessment Lower Extremity Assessment: Overall WFL for tasks assessed       Communication   Communication: No difficulties  Cognition Arousal/Alertness: Awake/alert Behavior During Therapy: WFL for tasks assessed/performed Overall Cognitive Status: Within Functional Limits for tasks assessed                                        General Comments      Exercises     Assessment/Plan    PT Assessment Patent does not need any further PT services  PT Problem List         PT Treatment Interventions      PT Goals (Current goals can  be found in the Care Plan section)  Acute Rehab PT Goals PT Goal Formulation: All assessment and education complete, DC therapy    Frequency     Barriers to discharge        Co-evaluation               AM-PAC PT "6 Clicks" Daily Activity  Outcome Measure Difficulty turning over in bed (including adjusting bedclothes, sheets and blankets)?: A Little Difficulty moving from lying on back to sitting on the side of the bed? : A Little Difficulty sitting down on and standing up from a chair with arms (e.g., wheelchair, bedside commode, etc,.)?: None Help needed moving to and from a bed to chair (including a wheelchair)?:  None Help needed walking in hospital room?: None Help needed climbing 3-5 steps with a railing? : None 6 Click Score: 22    End of Session   Activity Tolerance: Patient tolerated treatment well Patient left: in bed;with call bell/phone within reach;with bed alarm set Nurse Communication: Mobility status PT Visit Diagnosis: Unsteadiness on feet (R26.81)    Time: 1700-1718 PT Time Calculation (min) (ACUTE ONLY): 18 min   Charges:   PT Evaluation $PT Eval Low Complexity: 1 Low     PT G Codes:        06/08/17  Carencro Bing, PT [phone]  (pager)  Eliseo Gum Corydon Schweiss June 08, 2017, 5:26 PM

## 2017-06-05 NOTE — Progress Notes (Signed)
Patient going to OR. Alert and oriented; short stay called for report. Will continue to monitor.

## 2017-06-05 NOTE — Transfer of Care (Signed)
Immediate Anesthesia Transfer of Care Note  Patient: Jared [name]  Procedure(s) Performed: IRRIGATION AND DEBRIDEMENT PERIRECTAL ABSCESS (N/A Anus)  Patient Location: PACU  Anesthesia Type:General  Level of Consciousness: awake, alert  and oriented  Airway & Oxygen Therapy: Patient Spontanous Breathing and Patient connected to nasal cannula oxygen  Post-op Assessment: Report given to RN, Post -op Vital signs reviewed and stable and Patient moving all extremities X 4  Post vital signs: Reviewed and stable  Last Vitals:  Vitals:   06/05/17 0024 06/05/17 0519  BP: (!) 168/78 127/73  Pulse: 82 69  Resp: 19 18  Temp: 37.6 C 36.8 C  SpO2: 96% 93%    Last Pain:  Vitals:   06/05/17 0932  TempSrc:   PainSc: 5       Patients Stated Pain Goal: 4 (06/05/17 0932)  Complications: No apparent anesthesia complications

## 2017-06-05 NOTE — Progress Notes (Signed)
PROGRESS NOTE  Jared [name] MMN:[id number] DOB: [birth year] DOA: 06/04/2017 PCP: Jaclyn Shaggy, MD  HPI/Recap of past 24 hours: HPI from Dr Midge Minium on 06/04/17 Jared [name] is a 61 y.o. male with history of systolic CHF, hypertension, CAD, paroxysmal atrial fibrillation presents to the ER with complaints of worsening perirectal pain.  Patient started having the symptoms last week.  Had come to the ER 3 days ago and had CT scan done which showed perirectal abscess measuring around 1.5 cm and was discharged home on oral antibiotic. Pt was compliant with it, however pt rectal pain worsened and patient came back to the ER.  Denies any nausea vomiting or diarrhea.  Denies any chest pain or shortness of breath.  Has not had any fever or chills. In the ER, CT pelvis which showed increasing size of the perirectal abscess measuring now 5.1 x 4.1 x 3.3 cm. Patient started on IV Zosyn and admitted for possible incision and drainage. Gen surg consulted  Today, saw pt after I&D, denies any worsening rectal pain. Pt reported feeling better overall, denies any chest pain, SOB, abdominal pain, N/V/D/C, fever/chills  Assessment/Plan: Principal Problem:   Perirectal abscess Active Problems:   CAD (coronary artery disease)   Nonischemic dilated cardiomyopathy (HCC)   COPD GOLD III/still smoking    Chronic combined systolic and diastolic CHF (congestive heart failure) (HCC)   Atrial flutter (HCC)  #Perirectal abscess S/P I&D on 06/05/17 Afebrile, with leukocytosis CT abd/pelvis showed: peri-rectal abscess measuring 5.1 x 4.1 x 3.3 cm  Continue IV Zosyn Gen surg on board  #Chronic systolic heart failure  Compensated Last EF measured was 30-35% in April 2018 Re-start home lasix, Entresto and spironolactone in am  #Paroxysmal A. Fib  Rate controlled Restart Coumadin in am as pt is post op day 0  #History of CAD Chest pain free  #Hypertension Stable Restart home meds IV hydralzine  prn  #COPD Stable, duonebs prn    Code Status: Full  Family Communication: None at bedside  Disposition Plan: Home once stable   Consultants:  Gen surg  Procedures:  Perirectal I&D  Antimicrobials:  IV Zosyn  DVT prophylaxis:  SCDs   Objective: Vitals:   06/05/17 1123 06/05/17 1138 06/05/17 1145 06/05/17 1358  BP: 134/84 (!) 151/84  116/70  Pulse: (!) 101 95 82 70  Resp: 17 19 15 18   Temp: 98.1 F (36.7 C)   98.5 F (36.9 C)  TempSrc:    Oral  SpO2: 99% 96% 97% 95%  Weight:      Height:        Intake/Output Summary (Last 24 hours) at 06/05/2017 1527 Last data filed at 06/05/2017 1300 Gross per 24 hour  Intake 2060 ml  Output 355 ml  Net 1705 ml   Filed Weights   06/05/17 0024  Weight: 87.4 kg (192 lb 10.9 oz)    Exam:   General:  Alert, awake, O x 3   Cardiovascular: S1-S2 present, no added hrt sound  Respiratory: Decreased BS bilaterally  Abdomen: Soft, non-tender, non-distended, BS present, peri-rectal dressing C/D/I  Musculoskeletal: No pedal edema  Skin: Normal  Psychiatry: Normal mood   Data Reviewed: CBC: Recent Labs  Lab 06/02/17 0050 06/04/17 1654 06/05/17 0344  WBC 11.5* 14.0* 14.3*  NEUTROABS 6.4 9.1*  --   HGB 13.8 13.9 13.2  HCT 42.5 41.9 40.4  MCV 91.8 91.9 91.4  PLT 199 197 209   Basic Metabolic Panel: Recent Labs  Lab 06/02/17 0050 06/04/17 1654  06/05/17 0344  NA 139 138 137  K 3.8 3.7 3.5  CL 104 101 101  CO2 27 27 27   GLUCOSE 93 83 86  BUN 16 11 9   CREATININE 1.00 1.06 1.06  CALCIUM 8.8* 8.8* 8.7*   GFR: Estimated Creatinine Clearance: 85.3 mL/min (by C-G formula based on SCr of 1.06 mg/dL). Liver Function Tests: Recent Labs  Lab 06/04/17 1654  AST 20  ALT 10*  ALKPHOS 63  BILITOT 0.9  PROT 7.0  ALBUMIN 3.5   No results for input(s): LIPASE, AMYLASE in the last 168 hours. No results for input(s): AMMONIA in the last 168 hours. Coagulation Profile: Recent Labs  Lab 06/04/17 2036  06/05/17 0636  INR 1.10 1.10   Cardiac Enzymes: No results for input(s): CKTOTAL, CKMB, CKMBINDEX, TROPONINI in the last 168 hours. BNP (last 3 results) Recent Labs    08/03/16 1330  PROBNP 2,541*   HbA1C: No results for input(s): HGBA1C in the last 72 hours. CBG: Recent Labs  Lab 06/05/17 0214 06/05/17 0758 06/05/17 0842 06/05/17 1217  GLUCAP 84 64* 179* 132*   Lipid Profile: No results for input(s): CHOL, HDL, LDLCALC, TRIG, CHOLHDL, LDLDIRECT in the last 72 hours. Thyroid Function Tests: No results for input(s): TSH, T4TOTAL, FREET4, T3FREE, THYROIDAB in the last 72 hours. Anemia Panel: No results for input(s): VITAMINB12, FOLATE, FERRITIN, TIBC, IRON, RETICCTPCT in the last 72 hours. Urine analysis:    Component Value Date/Time   COLORURINE YELLOW 07/25/2016 1845   APPEARANCEUR HAZY (A) 07/25/2016 1845   LABSPEC 1.017 07/25/2016 1845   PHURINE 5.0 07/25/2016 1845   GLUCOSEU 50 (A) 07/25/2016 1845   HGBUR SMALL (A) 07/25/2016 1845   BILIRUBINUR NEGATIVE 07/25/2016 1845   KETONESUR NEGATIVE 07/25/2016 1845   PROTEINUR 100 (A) 07/25/2016 1845   NITRITE NEGATIVE 07/25/2016 1845   LEUKOCYTESUR NEGATIVE 07/25/2016 1845   Sepsis Labs: @LABRCNTIP (procalcitonin:4,lacticidven:4)  ) Recent Results (from the past 240 hour(s))  Surgical PCR screen     Status: None   Collection Time: 06/05/17  8:09 AM  Result Value Ref Range Status   MRSA, PCR NEGATIVE NEGATIVE Final   Staphylococcus aureus NEGATIVE NEGATIVE Final    Comment: (NOTE) The Xpert SA Assay (FDA approved for NASAL specimens in patients 78 years of age and older), is one component of a comprehensive surveillance program. It is not intended to diagnose infection nor to guide or monitor treatment.       Studies: Ct Pelvis W Contrast  Result Date: 06/04/2017 CLINICAL DATA:  Rectal abscess. EXAM: CT PELVIS WITH CONTRAST TECHNIQUE: Multidetector CT imaging of the pelvis was performed using the standard  protocol following the bolus administration of intravenous contrast. CONTRAST:  100 mL ISOVUE-300 IOPAMIDOL (ISOVUE-300) INJECTION 61% COMPARISON:  CT scan of June 02, 2017. FINDINGS: Urinary Tract:  No abnormality visualized. Bowel:  Unremarkable visualized pelvic bowel loops. Vascular/Lymphatic: No pathologically enlarged lymph nodes. No significant vascular abnormality seen. Reproductive:  No mass or other significant abnormality Other: 5.1 x 4.1 x 3.3 cm fluid collection is seen in the left perirectal region which is significantly enlarged compared to prior exam. This is consistent with abscess. Musculoskeletal: No suspicious bone lesions identified. IMPRESSION: 5.1 x 4.1 x 3.3 cm left perirectal abscess is noted which is significantly enlarged compared to prior exam. Electronically Signed   By: Lupita Raider, M.D.   On: 06/04/2017 21:29    Scheduled Meds: . dextrose  50 mL Intravenous Once  . metoprolol tartrate  2.5 mg Intravenous Q6H  Continuous Infusions: . lactated ringers 10 mL/hr at 06/05/17 1023  . piperacillin-tazobactam (ZOSYN)  IV 3.375 g (06/05/17 1413)     LOS: 1 day     Briant CedarNkeiruka J Ezenduka, MD [facility]   If 7PM-7AM, please contact night-coverage [email or web address] Password [email or web address] 06/05/2017, 3:27 PM

## 2017-06-05 NOTE — Progress Notes (Signed)
CBG this AM was 64. Patient received 50% Dextrose - 45ml and CBG came up to 179. MD notified. Will continue to monitor.

## 2017-06-05 NOTE — Op Note (Signed)
06/05/2017  11:11 AM  PATIENT:  Jared [name]  61 y.o. male  Patient Care Team: Jaclyn Shaggy, MD as PCP - General (Family Medicine)  PRE-OPERATIVE DIAGNOSIS:  PERIRECTAL ABCESS  POST-OPERATIVE DIAGNOSIS:  PERIRECTAL ABCESS  PROCEDURE:  1. Anal exam under anesthesia 2. Incision and drainage of perianal abscess  SURGEON:  Surgeon(s): Andria Meuse, MD  ASSISTANT: none   ANESTHESIA:   general  SPECIMEN:  No Specimen  DISPOSITION OF SPECIMEN:  N/A  COUNTS:  Sponge, needle and instrument counts were reported correct x2 at conclusion of the case.  PLAN OF CARE: Return to ward  PATIENT DISPOSITION:  PACU - hemodynamically stable.  INDICATION: Jared [name] is a 61yo gentleman with history of systolic CHF, hypertension, CAD, paroxysmal atrial fibrillation who came to the ER for reevaluation of perianal pain.  He was seen 3 days prior and a CT scan was done which demonstrated a perirectal abscess measuring 1.5 cm-she was seen by the on-call surgeon and prescribed a course of oral antibiotics which she was discharged home with he return to the ER with persistence/worsening of the symptoms.  A repeat CT scan demonstrated the abscess to now measure 5.1 x 4 x 3cm in size and surgery was consulted he was admitted for IV antibiotics to the internal medicine service with plans for the operating room for incision and drainage.  The anatomy and physiology of the anus and rectum were explained and diagrams utilized. The pathophysiology was elaborated. The procedure, material risk (including but not limited to pain, bleeding, infection, scarring, need for additional procedures, need for blood transfusion, incontinence of gas or feces, injury to surrounding structures, recurrence of abscess, heart attack, stroke, death), benefits and alternatives were explained. The patient's questions were answered to their satisfaction and they elected to proceed with surgery.  OR FINDINGS: Left perianal abscess.   No demonstratable internal opening in the anal canal.  DESCRIPTION: The patient was identified in the preoperative holding area and taken to the OR where they were placed on the operating room table. SCDs were placed.  General anesthesia was induced without difficulty. The patient was then positioned in high lithotomy with Allen stirrups.  Pressure points were padded. The patient was then prepped and draped in usual sterile fashion.  A surgical timeout was performed indicating the correct patient, procedure, positioning and need for preoperative antibiotics.  A well-lubricated finger was inserted in the anal canal and palpation did not reveal any significant findings including masses.  The abscess was spontaneously draining through a small opening in the left perianal skin.  This hole was opened further and pus freely drained.  The opening was then landscape to facilitate drainage with a cruciate incision in the coronary being trimmed.  The abscess cavity was bluntly explored and all loculations were broken and all pus evacuated.  The cavity was then irrigated with copious amounts of sterile saline.  Hemostasis was achieved with the use of electrocautery.  A lubricated Hill-Ferguson retractor was placed into the anal canal and this was subsequently exchanged for a silver bullet retractor.  A fistula probe was used to explore the medial wall of the abscess cavity and there is no demonstratable fistulous connection to the anal canal.  On examination of the anal canal there is no visible internal opening.  Hemostasis was verified and the wound was packed with moist Kerlix, covered with 4 x 4's and secured.  The patient was then taken out of lithotomy position, awakened and transferred to a stretcher  for transport to PACU in satisfactory condition.

## 2017-06-05 NOTE — Anesthesia Preprocedure Evaluation (Addendum)
Anesthesia Evaluation  Patient identified by MRN, date of birth, ID band Patient awake    Reviewed: Allergy & Precautions, NPO status , Patient's Chart, lab work & pertinent test results, reviewed documented beta blocker date and time   History of Anesthesia Complications Negative for: history of anesthetic complications  Airway Mallampati: I  TM Distance: >3 FB Neck ROM: Full    Dental  (+) Poor Dentition, Dental Advisory Given, Chipped, Loose, Missing   Pulmonary COPD,  COPD inhaler, Current Smoker,    breath sounds clear to auscultation       Cardiovascular hypertension, Pt. on medications and Pt. on home beta blockers (-) angina+ CAD (non-obstructive by cath 2012)   Rhythm:Regular Rate:Normal  4/18 ECHO: EF 30% to 35%. Wall motion was normal, valves OK   Neuro/Psych  Headaches, CVA (no symptoms but pt was told he had a stroke sometime in the past), No Residual Symptoms    GI/Hepatic Neg liver ROS, GERD  Controlled,  Endo/Other  negative endocrine ROS  Renal/GU negative Renal ROS     Musculoskeletal   Abdominal   Peds  Hematology Coumadin: INR 1.10   Anesthesia Other Findings   Reproductive/Obstetrics                            Anesthesia Physical Anesthesia Plan  ASA: III  Anesthesia Plan: General   Post-op Pain Management:    Induction: Intravenous  PONV Risk Score and Plan: 1 and Ondansetron and Dexamethasone  Airway Management Planned: LMA  Additional Equipment:   Intra-op Plan:   Post-operative Plan:   Informed Consent: I have reviewed the patients History and Physical, chart, labs and discussed the procedure including the risks, benefits and alternatives for the proposed anesthesia with the patient or authorized representative who has indicated his/her understanding and acceptance.   Dental advisory given  Plan Discussed with: CRNA and Surgeon  Anesthesia Plan  Comments: (Plan routine monitors, GA- LMA OK)        Anesthesia Quick Evaluation

## 2017-06-05 NOTE — Anesthesia Postprocedure Evaluation (Signed)
Anesthesia Post Note  Patient: Hotel manager  Procedure(s) Performed: IRRIGATION AND DEBRIDEMENT PERIRECTAL ABSCESS (N/A Anus)     Patient location during evaluation: PACU Anesthesia Type: General Level of consciousness: awake and alert, oriented and patient cooperative Pain management: pain level controlled Vital Signs Assessment: post-procedure vital signs reviewed and stable Respiratory status: spontaneous breathing, nonlabored ventilation and respiratory function stable Cardiovascular status: blood pressure returned to baseline and stable Postop Assessment: no apparent nausea or vomiting Anesthetic complications: no    Last Vitals:  Vitals:   06/05/17 1138 06/05/17 1145  BP: (!) 151/84   Pulse: 95 82  Resp: 19 15  Temp:    SpO2: 96% 97%    Last Pain:  Vitals:   06/05/17 1123  TempSrc:   PainSc: 0-No pain                 Abri Vacca,E. Abagael Kramm

## 2017-06-06 ENCOUNTER — Encounter (HOSPITAL_COMMUNITY): Payer: Self-pay | Admitting: Surgery

## 2017-06-06 ENCOUNTER — Telehealth: Payer: Self-pay | Admitting: Family Medicine

## 2017-06-06 LAB — BASIC METABOLIC PANEL
Anion gap: 9 (ref 5–15)
BUN: 11 mg/dL (ref 6–20)
CO2: 31 mmol/L (ref 22–32)
Calcium: 9.1 mg/dL (ref 8.9–10.3)
Chloride: 98 mmol/L — ABNORMAL LOW (ref 101–111)
Creatinine, Ser: 0.97 mg/dL (ref 0.61–1.24)
GFR calc Af Amer: >60 mL/min (ref >60)
GFR calc non Af Amer: >60 mL/min (ref >60)
Glucose, Bld: 159 mg/dL — ABNORMAL HIGH (ref 65–99)
Potassium: 4.2 mmol/L (ref 3.5–5.1)
Sodium: 138 mmol/L (ref 135–145)

## 2017-06-06 LAB — CBC WITH DIFFERENTIAL/PLATELET
Basophils Absolute: 0 10*3/uL (ref 0.0–0.1)
Basophils Relative: 0 %
Eosinophils Absolute: 0 10*3/uL (ref 0.0–0.7)
Eosinophils Relative: 0 %
HCT: 41.2 % (ref 39.0–52.0)
Hemoglobin: 13.6 g/dL (ref 13.0–17.0)
Lymphocytes Relative: 15 %
Lymphs Abs: 2.2 10*3/uL (ref 0.7–4.0)
MCH: 30.2 pg (ref 26.0–34.0)
MCHC: 33 g/dL (ref 30.0–36.0)
MCV: 91.4 fL (ref 78.0–100.0)
Monocytes Absolute: 1.2 10*3/uL — ABNORMAL HIGH (ref 0.1–1.0)
Monocytes Relative: 8 %
Neutro Abs: 11.7 10*3/uL — ABNORMAL HIGH (ref 1.7–7.7)
Neutrophils Relative %: 77 %
Platelets: 225 10*3/uL (ref 150–400)
RBC: 4.51 MIL/uL (ref 4.22–5.81)
RDW: 12.8 % (ref 11.5–15.5)
WBC: 15.1 10*3/uL — ABNORMAL HIGH (ref 4.0–10.5)

## 2017-06-06 LAB — GLUCOSE, CAPILLARY
Glucose-Capillary: 125 mg/dL — ABNORMAL HIGH (ref 65–99)
Glucose-Capillary: 254 mg/dL — ABNORMAL HIGH (ref 65–99)

## 2017-06-06 LAB — PROTIME-INR
INR: 1.02
Prothrombin Time: 13.3 seconds (ref 11.4–15.2)

## 2017-06-06 MED ORDER — WARFARIN - PHARMACIST DOSING INPATIENT: Freq: Every day | Status: DC

## 2017-06-06 MED ORDER — DOXYCYCLINE HYCLATE 100 MG PO TABS
100.0000 mg | ORAL_TABLET | Freq: Two times a day (BID) | ORAL | Status: DC
  Administered 2017-06-06: 100 mg via ORAL
  Filled 2017-06-06: qty 1

## 2017-06-06 MED ORDER — WARFARIN 1.25 MG HALF TABLET
11.2500 mg | ORAL_TABLET | Freq: Once | ORAL | Status: DC
  Filled 2017-06-06: qty 1

## 2017-06-06 MED ORDER — DOXYCYCLINE HYCLATE 100 MG PO CAPS: 100.0000 mg | ORAL_CAPSULE | Freq: Two times a day (BID) | ORAL | 0 refills | Status: AC

## 2017-06-06 MED ORDER — DOXYCYCLINE HYCLATE 100 MG PO CAPS: 100.0000 mg | ORAL_CAPSULE | Freq: Two times a day (BID) | ORAL | 0 refills | Status: DC

## 2017-06-06 NOTE — Progress Notes (Signed)
Patient was discharged home by MD order; discharged instructions  review and give to patient with care notes; IV DIC; patient was educated about the dressing changed and to follow up with the [facility] in 2 weeks; patient refused to be escorted to the car by staff.

## 2017-06-06 NOTE — Progress Notes (Signed)
ANTICOAGULATION CONSULT NOTE - Initial Consult  Pharmacy Consult for Coumadin Indication: atrial fibrillation  Allergies  Allergen Reactions  . Lisinopril Cough    Patient Measurements: Height: 6\' 3"  (190.5 cm) Weight: 192 lb 10.9 oz (87.4 kg) IBW/kg (Calculated) : 84.5  Assessment: 61 yo M on Coumadin 7.5mg  daily exc 11.25mg  on Mon and Thurs PTA for Afib. Held for I&D. INR down to 1.02 today. Will restart on 12/27. Hgb 13.6, plts wnl.  Goal of Therapy:  INR 2-3 Monitor platelets by anticoagulation protocol: Yes   Plan:  Give Coumadin 11.25mg  PO x 1 Monitor daily INR, CBC, s/s of bleed  Enzo Bi, PharmD, [facility] Clinical Pharmacist Pager [phone] 06/06/2017 11:42 AM

## 2017-06-06 NOTE — Discharge Summary (Signed)
Discharge Summary  [name]:[id number] DOB: 1955-09-28  PCP: Jaclyn Shaggy, MD  Admit date: 06/04/2017 Discharge date: 06/06/2017  Time spent: >65mins  Recommendations for Outpatient Follow-up:  1. PCP 2. Gen surgery  Discharge Diagnoses:  Active Hospital Problems   Diagnosis Date Noted  . Perirectal abscess 06/04/2017  . Atrial flutter (HCC)   . Chronic combined systolic and diastolic CHF (congestive heart failure) (HCC) 07/01/2016  . COPD GOLD III/still smoking  02/29/2016  . 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 CAD (coronary artery disease) 10/07/2013    Resolved Hospital Problems  No resolved problems to display.    Discharge Condition: Stable  Diet recommendation: Heart healthy   Vitals:   06/05/17 2131 06/06/17 0514  BP: 114/72 136/84  Pulse: 83 68  Resp: 16 18  Temp:  98.6 F (37 C)  SpO2: 93% 96%    History of present illness:  Jared [name] a 61 y.o.malewithhistory of systolic CHF, hypertension, CAD, paroxysmal atrial fibrillation presents to the ER with complaints of worsening perirectal pain. Patient started having the symptoms last week. Came to the ER 3 days ago and had CT scan done which showed perirectal abscess measuring around 1.5 cm and was discharged home on oral antibiotic. Pt was compliant with it, however pt rectal pain worsenedand patient came back to the ER. Denies any nausea vomiting or diarrhea.Denies any chest pain or shortness of breath. Has not had any fever or chills. In the ER, CT pelvis which showed increasing size of the perirectal abscess measuring now 5.1 x 4.1 x 3.3 cm. Patient started on IV Zosyn and admitted for incision and drainage. Gen surg consulted  Today, pt reported feeling better, denies any worsening rectal pain, chest pain, SOB, abdominal pain, N/V/D/C, fever/chills. Pt stable to be discharged  Hospital Course:  Principal Problem:   Perirectal abscess Active Problems:   CAD  (coronary artery disease)   Nonischemic dilated cardiomyopathy (HCC)   COPD GOLD III/still smoking    Chronic combined systolic and diastolic CHF (congestive heart failure) (HCC)   Atrial flutter (HCC)  #Perirectal abscess S/P I&D on 06/05/17 Afebrile, with leukocytosis likely reactive post I&D CT abd/pelvis showed: peri-rectal abscess measuring 5.1 x 4.1 x 3.3 cm  S/P IV Zosyn, d/c on 3 more days of PO doxycycline Gen surg on board, outpt follow up recommended  #Chronic systolic heart failure  Compensated Last EF measured was 30-35% in April 2018 Re-start home lasix, Entresto and spironolactone  #Paroxysmal A. Fib Rate controlled Restart Coumadin  #History of CAD Chest pain free  #Hypertension Stable Restart home meds  #COPD Restart inhalers     Procedures:  S/P I & D on 06/05/17  Consultations:  Gen surg  Discharge Exam: BP 136/84 (BP Location: Left Arm)   Pulse 68   Temp 98.6 F (37 C)   Resp 18   Ht 6\' 3"  (1.905 m)   Wt 87.4 kg (192 lb 10.9 oz)   SpO2 96%   BMI 24.08 kg/m   General: AAO X 3, NAD  Cardiovascular: S1-S2 present, no added heart sound Respiratory: Chest clear bilaterally   Discharge Instructions You were cared for by a hospitalist during your hospital stay. If you have any questions about your discharge medications or the care you received while you were in the hospital after you are discharged, you can call the unit and asked to speak with the hospitalist on call if the hospitalist that took care of you is not available. Once you are  discharged, your primary care physician will handle any further medical issues. Please note that NO REFILLS for any discharge medications will be authorized once you are discharged, as it is imperative that you return to your primary care physician (or establish a relationship with a primary care physician if you do not have one) for your aftercare needs so that they can reassess your need for  medications and monitor your lab values.  Discharge Instructions    Diet - low sodium heart healthy   Complete by:  As directed    Increase activity slowly   Complete by:  As directed      Allergies as of 06/06/2017      Reactions   Lisinopril Cough      Medication List    TAKE these medications   albuterol 108 (90 Base) MCG/ACT inhaler Commonly known as:  PROVENTIL HFA;VENTOLIN HFA Inhale 2 puffs into the lungs every 6 (six) hours as needed for wheezing or shortness of breath.   bisoprolol 5 MG tablet Commonly known as:  ZEBETA Take 1 tablet (5 mg total) by mouth daily.   docusate sodium 100 MG capsule Commonly known as:  COLACE Take 1 capsule (100 mg total) by mouth every 12 (twelve) hours.   doxycycline 100 MG capsule Commonly known as:  VIBRAMYCIN Take 1 capsule (100 mg total) by mouth 2 (two) times daily for 3 days. Take this medication for another 3 days as prescribed What changed:  additional instructions   furosemide 40 MG tablet Commonly known as:  LASIX Take 1 tablet (40 mg total) by mouth daily.   Glycopyrrolate-Formoterol 9-4.8 MCG/ACT Aero Commonly known as:  BEVESPI AEROSPHERE Inhale 2 puffs into the lungs 2 (two) times daily.   HYDROcodone-acetaminophen 5-325 MG tablet Commonly known as:  NORCO/VICODIN Take 1-2 tablets by mouth every 6 (six) hours as needed.   hyoscyamine 0.125 MG SL tablet Commonly known as:  LEVSIN SL Place 1 tablet (0.125 mg total) under the tongue every 8 (eight) hours as needed.   OXYGEN 2lpm when needed per pt   polyethylene glycol powder powder Commonly known as:  GLYCOLAX/MIRALAX Take 17 g by mouth 2 (two) times daily. Until daily soft stools  OTC   sacubitril-valsartan 24-26 MG Commonly known as:  ENTRESTO Take 1 tablet by mouth 2 (two) times daily.   spironolactone 25 MG tablet Commonly known as:  ALDACTONE Take 0.5 tablets (12.5 mg total) by mouth daily.   warfarin 7.5 MG tablet Commonly known as:   COUMADIN Take as directed. If you are unsure how to take this medication, talk to your nurse or doctor. Original instructions:  Take as directed by coumadin clinic      Allergies  Allergen Reactions  . Lisinopril Cough   Follow-up Information    [facility], [place]. Schedule an appointment as soon as possible for a visit in 2 week(s).   Specialty:  General Surgery Contact information: [address] [place] [address] [phone]       Jaclyn Shaggy, MD. Schedule an appointment as soon as possible for a visit in 1 week(s).   Specialty:  Family Medicine Contact information: [address] [place] [address] [phone]            The results of significant diagnostics from this hospitalization (including imaging, microbiology, ancillary and laboratory) are listed below for reference.    Significant Diagnostic Studies: Ct Pelvis W Contrast  Result Date: 06/04/2017 CLINICAL DATA:  Rectal  abscess. EXAM: CT PELVIS WITH CONTRAST TECHNIQUE: Multidetector CT imaging of the pelvis was performed using the standard protocol following the bolus administration of intravenous contrast. CONTRAST:  100 mL ISOVUE-300 IOPAMIDOL (ISOVUE-300) INJECTION 61% COMPARISON:  CT scan of June 02, 2017. FINDINGS: Urinary Tract:  No abnormality visualized. Bowel:  Unremarkable visualized pelvic bowel loops. Vascular/Lymphatic: No pathologically enlarged lymph nodes. No significant vascular abnormality seen. Reproductive:  No mass or other significant abnormality Other: 5.1 x 4.1 x 3.3 cm fluid collection is seen in the left perirectal region which is significantly enlarged compared to prior exam. This is consistent with abscess. Musculoskeletal: No suspicious bone lesions identified. IMPRESSION: 5.1 x 4.1 x 3.3 cm left perirectal abscess is noted which is significantly enlarged compared to prior exam. Electronically Signed   By: Lupita RaiderJames  Green Jr, M.D.    On: 06/04/2017 21:29   Ct Pelvis W Contrast  Result Date: 06/02/2017 CLINICAL DATA:  61 year old male with rectal pain and swelling. History of colonoscopy and cholecystectomy. EXAM: CT PELVIS WITH CONTRAST TECHNIQUE: Multidetector CT imaging of the pelvis was performed using the standard protocol following the bolus administration of intravenous contrast. CONTRAST:  100mL ISOVUE-300 IOPAMIDOL (ISOVUE-300) INJECTION 61% COMPARISON:  None. FINDINGS: Urinary Tract: The visualized distal ureters and urinary bladder appear unremarkable. Bowel: No bowel dilatation or active inflammation in the pelvis. Normal appendix. Vascular/Lymphatic: Advanced aortoiliac atherosclerotic disease. No pelvic adenopathy. Reproductive: Stop the prostate and seminal vesicles are grossly unremarkable. Other: There is a 15 x 23 mm low attenuating focus along the posterior perirectal soft tissue on the left most consistent with a phlegmon or developing abscess. Clinical correlation is recommended. Musculoskeletal: Degenerative changes at L5-S1. No acute osseous pathology. IMPRESSION: Small left posterior perirectal phlegmon/developing abscess. Clinical correlation is recommended. No other acute pelvic pathology. Electronically Signed   By: Elgie CollardArash  Radparvar M.D.   On: 06/02/2017 03:34    Microbiology: Recent Results (from the past 240 hour(s))  Surgical PCR screen     Status: None   Collection Time: 06/05/17  8:09 AM  Result Value Ref Range Status   MRSA, PCR NEGATIVE NEGATIVE Final   Staphylococcus aureus NEGATIVE NEGATIVE Final    Comment: (NOTE) The Xpert SA Assay (FDA approved for NASAL specimens in patients 63 years of age and older), is one component of a comprehensive surveillance program. It is not intended to diagnose infection nor to guide or monitor treatment.      Labs: Basic Metabolic Panel: Recent Labs  Lab 06/02/17 0050 06/04/17 1654 06/05/17 0344 06/06/17 0643  NA 139 138 137 138  K 3.8 3.7 3.5  4.2  CL 104 101 101 98*  CO2 27 27 27 31   GLUCOSE 93 83 86 159*  BUN 16 11 9 11   CREATININE 1.00 1.06 1.06 0.97  CALCIUM 8.8* 8.8* 8.7* 9.1   Liver Function Tests: Recent Labs  Lab 06/04/17 1654  AST 20  ALT 10*  ALKPHOS 63  BILITOT 0.9  PROT 7.0  ALBUMIN 3.5   No results for input(s): LIPASE, AMYLASE in the last 168 hours. No results for input(s): AMMONIA in the last 168 hours. CBC: Recent Labs  Lab 06/02/17 0050 06/04/17 1654 06/05/17 0344 06/06/17 0643  WBC 11.5* 14.0* 14.3* 15.1*  NEUTROABS 6.4 9.1*  --  11.7*  HGB 13.8 13.9 13.2 13.6  HCT 42.5 41.9 40.4 41.2  MCV 91.8 91.9 91.4 91.4  PLT 199 197 209 225   Cardiac Enzymes: No results for input(s): CKTOTAL, CKMB, CKMBINDEX, TROPONINI in the  last 168 hours. BNP: BNP (last 3 results) Recent Labs    07/01/16 2019 07/25/16 1631 07/30/16 1431  BNP 728.8* 549.3* 1,458.6*    ProBNP (last 3 results) Recent Labs    08/03/16 1330  PROBNP 2,541*    CBG: Recent Labs  Lab 06/05/17 0842 06/05/17 1217 06/05/17 1644 06/06/17 0024 06/06/17 0740  GLUCAP 179* 132* 175* 254* 125*       Signed:  Briant Cedar, MD [facility] 06/06/2017, 11:27 PM

## 2017-06-06 NOTE — Telephone Encounter (Signed)
Call placed to [name], case manager, #[phone] regarding patient. Jared [name] confirmed that patient is still in the hospital and might be discharged by today. Hospital follow up was set up for Friday 06/14/2017 at 2:00pm.

## 2017-06-06 NOTE — Progress Notes (Signed)
Subjective No acute events. Perianal pain resolved. Having BMs. Denies complaints.  Objective: Vital signs in last 24 hours: Temp:  [98.1 F (36.7 C)-98.6 F (37 C)] 98.6 F (37 C) (12/27 0514) Pulse Rate:  [68-101] 68 (12/27 0514) Resp:  [15-19] 18 (12/27 0514) BP: (114-151)/(70-84) 136/84 (12/27 0514) SpO2:  [93 %-99 %] 96 % (12/27 0514) Last BM Date: 06/06/17  Intake/Output from previous day: 12/26 0701 - 12/27 0700 In: 1120 [P.O.:120; I.V.:900; IV Piggyback:100] Out: 205 [Urine:200; Blood:5] Intake/Output this shift: Total I/O In: 460 [P.O.:360; IV Piggyback:100] Out: 100 [Urine:100]  Gen: NAD, comfortable CV: RRR Pulm: Normal work of breathing Abd: Soft, NT Anorectal: Dressing changed - clean 4x4 placed in wound bed as a 'wick' - no substantial packing necessary Ext: SCDs in place  Lab Results: CBC  Recent Labs    06/05/17 0344 06/06/17 0643  WBC 14.3* 15.1*  HGB 13.2 13.6  HCT 40.4 41.2  PLT 209 225   BMET Recent Labs    06/05/17 0344 06/06/17 0643  NA 137 138  K 3.5 4.2  CL 101 98*  CO2 27 31  GLUCOSE 86 159*  BUN 9 11  CREATININE 1.06 0.97  CALCIUM 8.7* 9.1   PT/INR Recent Labs    06/05/17 0636 06/06/17 0643  LABPROT 14.1 13.3  INR 1.10 1.02   ABG No results for input(s): PHART, HCO3 in the last 72 hours.  Invalid input(s): PCO2, PO2  Studies/Results:  Anti-infectives: Anti-infectives (From admission, onward)   Start     Dose/Rate Route Frequency Ordered Stop   06/05/17 0600  piperacillin-tazobactam (ZOSYN) IVPB 3.375 g     3.375 g 12.5 mL/hr over 240 Minutes Intravenous Every 8 hours 06/04/17 2317     06/04/17 2230  piperacillin-tazobactam (ZOSYN) IVPB 3.375 g     3.375 g 100 mL/hr over 30 Minutes Intravenous  Once 06/04/17 2216 06/04/17 2339       Assessment/Plan: Patient Active Problem List   Diagnosis Date Noted  . Perirectal abscess 06/04/2017  . Elevated LFTs 08/13/2016  . Periumbilical abdominal pain 08/13/2016   . Chronic respiratory failure with hypoxia (HCC) 07/24/2016  . Encounter for therapeutic drug monitoring 07/11/2016  . Atrial flutter (HCC)   . Hepatic congestion 07/02/2016  . Chronic combined systolic and diastolic CHF (congestive heart failure) (HCC) 07/01/2016  . COPD exacerbation (HCC) 06/03/2016  . Prediabetes 05/14/2016  . Hemoptysis 05/06/2016  . COPD GOLD III/still smoking  02/29/2016  . Cigarette smoker 01/09/2016  . CAD (coronary artery disease) 10/07/2013  . 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 Centrilobular emphysema (HCC) 10/04/2013  . Pulmonary infiltrate in right lung on CXR 10/03/2013  . Essential hypertension    s/p Procedure(s): IRRIGATION AND DEBRIDEMENT PERIRECTAL ABSCESS 06/05/2017  -Plan for dressing change daily and after BMs - advised that after BMs he get in shower/bath to clean wound; place clean moist 4x4 in the wound bed -No further abx necessary -Ok for discharge from our standpoint with plans to f/u at [facility] in 2 weeks   LOS: 2 days   Stephanie Coup. Cliffton Asters, M.D. [facility]

## 2017-06-07 ENCOUNTER — Telehealth: Payer: Self-pay | Admitting: Family Medicine

## 2017-06-07 NOTE — Telephone Encounter (Signed)
Call placed to patient #[phone] to check on his status following his discharge. No answer. Call dropped after several rings.

## 2017-06-10 ENCOUNTER — Telehealth: Payer: Self-pay

## 2017-06-10 NOTE — Telephone Encounter (Signed)
[facility] Clinic Post-discharge Follow-Up Phone Call:  Date of Discharge: 06/06/2017 Principal Discharge Diagnosis(es):  Peri-rectal abscess, CAD, non ischemic dilated cardiomyopathy Post-discharge Communication: (Clearly document all attempts clearly and date contact made) call placed to the patient # [phone] and the call dropped after 7 rings. Call placed to his sister, Tinnie Gens and requested that she have the patient return the call to this CM. Clinic # provided. She said that she would be in touch with him and have him call the clinic.   The patient has an appointment with Dr Venetia Night on 06/14/17. He was discharged on coumadin 7.5mg  with instructions to take as directed by the coumadin clinic.  It does not appear that that he has an appointment scheduled with the coumadin clinic.  Call Completed: No

## 2017-06-10 NOTE — Telephone Encounter (Signed)
We will get INR 06/14/17 when he is in clinic or if he needs to be seen sooner, I can see him 06/12/17.

## 2017-06-12 ENCOUNTER — Telehealth: Payer: Self-pay

## 2017-06-12 NOTE — Telephone Encounter (Signed)
Transitional [facility] Clinic Post-discharge Follow-Up Phone Call:  Date of Discharge:  06/06/2017 Principal Discharge Diagnosis(es): perirectal abscess, atrial flutter, chronic combined systolic and diastolic CHF Post-discharge Communication: (Clearly document all attempts clearly and date contact made) call placed to the patient # [phone] and the call was dropped after 7 rings. Call then placed to the patient's sister, Jared [name] who stated that she gave the patient the information today to have him call [facility]. She then noted that the patient has a new phone # [phone]. Call then placed the patient at the new phone #.  Call Completed: Yes                    With Whom: patient Interpreter Needed: No     Please check all that apply:  X  Patient is knowledgeable of his/her condition(s) and/or treatment. X  Patient is caring for self at home.  - stated that he is doing " alright" and is living at a boarding house. No problems accessing food.  ? Patient is receiving assist at home from family and/or caregiver. Family and/or caregiver is knowledgeable of patient's condition(s) and/or treatment. ? Patient is receiving home health services. If so, name of agency.     Medication Reconciliation:  ? Medication list reviewed with patient. X  Patient obtained all discharge medications. If not, why?  - He stated that he does not have his warfarin and has not had it for about a week. He said that he does not have any refills and has not called the coumadin clinic. He did not want to call the coumadin clinic at this time and stated that he would call when he comes to [facility] on 06/14/17.  Informed him that Dr Venetia Night would be notified. He did state that he understands that he needs to be on the warfarin for the rest of his life. He said that he finished taking the antibiotic as ordered. He also noted that he came to the [facility] to pick up any new medication after he was discharged and there was no new medication for  him. When asked about reviewing his medication list, he said that he didn't; have the list with with but he has his medications and had no questions about medications at this time    Activities of Daily Living:  X  Independent ? Needs assist (describe; ? home DME used) ? [employer] (describe, ? home DME used)   Community resources in place for patient:  ? None  X  Home Health/Home DME - no assistive device needed for ambulation. He said that he has O2 that he uses @ 2L when needed which he stated is only about once a week.  ? Assisted Living ? Support Group          Patient Education: He stated that he has not made an appointment for surgical follow up yet and said he would do that when he comes to the [facility] on 06/14/17. He noted that has not had much draining from his surgical site. He stated that when he changes his pad there is only a small amount of drainage. He could not describe the drainage and did not state that it was blood.   He confirmed his appointment for 06/14/17 @ 1400 but said that he did not have a ride. Informed him that [facility] could send a cab but will need to call to confirm with him the day /morning prior ordering the cab that he will still be  coming to his appointment.  No other questions/concerns reported at this time.         Update provided to Jared [name], RPH/[facility] and Dr Venetia Night regarding the status of the warfarin.

## 2017-06-12 NOTE — Telephone Encounter (Signed)
Call placed to [name] coumadin clinic - spoke to Jared [name] and explained that the patient informed this CM that he does not have any coumadin, he knows that he is to follow up with the coumadin clinic but has not done so.  Jared [name] stated that he was a no show for his appointment on 06/05/17 however, he was hospitalized. Informed Jared [name] that the phone # for the patient has been updated in [place] said that she would call the patient to have him come in and would check on ordering coumadin.

## 2017-06-12 NOTE — Telephone Encounter (Signed)
Okay to draw INR at his 1/4 visit

## 2017-06-14 ENCOUNTER — Telehealth: Payer: Self-pay | Admitting: Family Medicine

## 2017-06-14 ENCOUNTER — Ambulatory Visit: Payer: Self-pay | Attending: Family Medicine | Admitting: Family Medicine

## 2017-06-14 ENCOUNTER — Encounter: Payer: Self-pay | Admitting: Family Medicine

## 2017-06-14 VITALS — BP 103/62 | HR 73 | Temp 97.8°F | Ht 75.0 in | Wt 196.2 lb

## 2017-06-14 DIAGNOSIS — I428 Other cardiomyopathies: Secondary | ICD-10-CM | POA: Insufficient documentation

## 2017-06-14 DIAGNOSIS — I5042 Chronic combined systolic (congestive) and diastolic (congestive) heart failure: Secondary | ICD-10-CM | POA: Insufficient documentation

## 2017-06-14 DIAGNOSIS — Z79899 Other long term (current) drug therapy: Secondary | ICD-10-CM | POA: Insufficient documentation

## 2017-06-14 DIAGNOSIS — I420 Dilated cardiomyopathy: Secondary | ICD-10-CM | POA: Insufficient documentation

## 2017-06-14 DIAGNOSIS — I2510 Atherosclerotic heart disease of native coronary artery without angina pectoris: Secondary | ICD-10-CM | POA: Insufficient documentation

## 2017-06-14 DIAGNOSIS — I110 Hypertensive heart disease with heart failure: Secondary | ICD-10-CM | POA: Insufficient documentation

## 2017-06-14 DIAGNOSIS — Z9119 Patient's noncompliance with other medical treatment and regimen: Secondary | ICD-10-CM

## 2017-06-14 DIAGNOSIS — J449 Chronic obstructive pulmonary disease, unspecified: Secondary | ICD-10-CM | POA: Insufficient documentation

## 2017-06-14 DIAGNOSIS — Z7901 Long term (current) use of anticoagulants: Secondary | ICD-10-CM | POA: Insufficient documentation

## 2017-06-14 DIAGNOSIS — I4892 Unspecified atrial flutter: Secondary | ICD-10-CM | POA: Insufficient documentation

## 2017-06-14 DIAGNOSIS — K611 Rectal abscess: Secondary | ICD-10-CM | POA: Insufficient documentation

## 2017-06-14 DIAGNOSIS — Z9114 Patient's other noncompliance with medication regimen: Secondary | ICD-10-CM | POA: Insufficient documentation

## 2017-06-14 DIAGNOSIS — Z91199 Patient's noncompliance with other medical treatment and regimen due to unspecified reason: Secondary | ICD-10-CM | POA: Insufficient documentation

## 2017-06-14 DIAGNOSIS — Z8673 Personal history of transient ischemic attack (TIA), and cerebral infarction without residual deficits: Secondary | ICD-10-CM | POA: Insufficient documentation

## 2017-06-14 MED ORDER — WARFARIN SODIUM 7.5 MG PO TABS: ORAL_TABLET | ORAL | 0 refills | Status: DC

## 2017-06-14 MED FILL — BISOPROLOL FUMARATE 5 MG TA: 5 | 30 days supply | Qty: 30 | Fill #5

## 2017-06-14 MED FILL — ?FUROSEMIDE 40 MG TABLET: 40 | 30 days supply | Qty: 30 | Fill #2

## 2017-06-14 MED FILL — **ENTRESTO 24-26 MG TABLET: 24-26 | 14 days supply | Qty: 28 | Fill #0

## 2017-06-14 MED FILL — WARFARIN SODIUM 7.5 MG TAB: 7.5 | 30 days supply | Qty: 30 | Fill #0

## 2017-06-14 MED FILL — SPIRONOLACTONE 25 MG TABS: 25 | 30 days supply | Qty: 15 | Fill #3

## 2017-06-14 MED FILL — $VENTOLIN HFA 18G INHALER: 108 (90 BAS | 25 days supply | Qty: 18 | Fill #1

## 2017-06-14 NOTE — Patient Instructions (Signed)
Perirectal Abscess An abscess is an infected area that contains a collection of pus. A perirectal abscess is an abscess that is near the opening of the anus or around the rectum. A perirectal abscess can cause a lot of pain, especially during bowel movements. What are the causes? This condition is almost always caused by an infection that starts in an anal gland. What increases the risk? This condition is more likely to develop in:  People with diabetes or inflammatory bowel disease.  People whose body defense system (immune system) is weak.  People who have anal sex.  People who have a sexually transmitted disease (STD).  People who have certain kinds of cancers, such as rectal carcinoma, leukemia, or lymphoma.  What are the signs or symptoms? The main symptom of this condition is pain. The pain may be a throbbing pain that gets worse during bowel movements. Other symptoms include:  Fever.  Swelling.  Redness.  Bleeding.  Constipation.  How is this diagnosed? The condition is diagnosed with a physical exam. If the abscess is not visible, a health care provider may need to place a finger inside the rectum to find the abscess. Sometimes, imaging tests are done to determine the size and location of the abscess. These tests may include:  An ultrasound.  An MRI.  A CT scan.  How is this treated? This condition is usually treated with incision and drainage surgery. Incision and drainage surgery involves making an incision over the abscess to drain the pus. Treatment may also involve antibiotic medicine, pain medicine, stool softeners, or laxatives. Follow these instructions at home:  Take medicines only as directed by your health care provider.  If you were prescribed an antibiotic, finish all of it even if you start to feel better.  To relieve pain, try sitting: ? In a warm, shallow bath (sitz bath). ? On a heating pad with the setting on low. ? On an inflatable  donut-shaped cushion.  Follow any diet instructions as directed by your health care provider.  Keep all follow-up visits as directed by your health care provider. This is important. Contact a health care provider if:  Your abscess is bleeding.  You have pain, swelling, or redness that is getting worse.  You are constipated.  You feel ill.  You have muscle aches or chills.  You have a fever.  Your symptoms return after the abscess has healed. This information is not intended to replace advice given to you by your health care provider. Make sure you discuss any questions you have with your health care provider. Document Released: 05/25/2000 Document Revised: 11/03/2015 Document Reviewed: 04/07/2014 Elsevier Interactive Patient Education  2018 [employer]  

## 2017-06-14 NOTE — Progress Notes (Signed)
Transitional care clinic  Subjective:  Patient ID: Jared [name], male    DOB: 12-Mar-1956  Age: 62 y.o. MRN: [id number]  CC: Hospitalization Follow-up   HPI Jared [name] is a 62 year old male with a history of hypertension, COPD, tobacco abuse, NICM , CHF (EF 30-35% from 2-D echo 09/2016), atrial flutter who comes in today for a follow-up visit at the transitional care clinic after hospitalization at [facility] from 06/04/17 through 06/06/17 for a peri-rectal abscess.  He had presented with a perirectal abscess which had not responded to outpatient treatment with antibiotics and CT pelvis revealed increasing size of the abscess; labs revealed leukocytosis but he had no fever. He was placed on IV antibiotics and underwent incision and drainage by general surgery on 06/05/17 after which he was placed on doxycycline with resulting improvement in his symptoms. He was subsequently discharged to follow-up with general surgery.  He presents today stating the abscess is improving and he has an upcoming appointment with general surgery in 3 days.  His fever or significant pain. He has not been taking any medications as he does not have them and has not been on Coumadin either. Review of his chart indicates he was last seen by the Coumadin clinic about 6 months ago. He denies chest pains, shortness of breath. Prior to this visit the office staff had called the Coumadin clinic to obtain an appointment for him in 3 days and an appointment with cardiology at the end of the month.  Past Medical History:  Diagnosis Date  . CAD (coronary artery disease)    a. LHC 5/12:  LAD 20, pLCx 20, pRCA 40, dRCA 40, EF 35%, diff HK  //  b. Myoview 4/16: Overall Impression: High risk stress nuclear study There is no evidence of ischemia. There is severe LV dysfunction. LV Ejection Fraction: 30%. LV Wall Motion: There is global LV hypokinesis.   Marland Kitchen CAP (community acquired pneumonia) 09/2013  . Chronic combined  systolic and diastolic CHF (congestive heart failure) (HCC)    a. Echo 4/16:Mild LVH, EF 40-45%, diffuse HK //  b. Echo 8/17: EF 35-40%, diffuse HK, diastolic dysfunction, aortic sclerosis, trivial MR, moderate LAE, normal RVSF, moderate RAE, mild TR, PASP 42 mmHg // c. Echo 4/18: Mild concentric LVH, EF 30-35, normal wall motion, grade 1 diastolic dysfunction, PASP 49  . Cluster headache    "hx; haven't had one in awhile" (01/09/2016)  . COPD (chronic obstructive pulmonary disease) (HCC)    [name] 01/09/2016  . History of CVA (cerebrovascular accident)   . Hypertension   . NICM (nonischemic cardiomyopathy) (HCC)   . Tobacco abuse     Past Surgical History:  Procedure Laterality Date  . CARDIAC CATHETERIZATION  10/2010   LM normal, LAD with 20% irregularities, LCX with 20%, RCA with 40% prox and 40% distal - EF of 35%  . COLONOSCOPY W/ BIOPSIES AND POLYPECTOMY    . EXCISION MASS HEAD    . INCISION AND DRAINAGE PERIRECTAL ABSCESS N/A 06/05/2017   Procedure: IRRIGATION AND DEBRIDEMENT PERIRECTAL ABSCESS;  Surgeon: Andria Meuse, MD;  Location: [facility] OR;  Service: General;  Laterality: N/A;  . VIDEO BRONCHOSCOPY Bilateral 05/08/2016   Procedure: VIDEO BRONCHOSCOPY WITH FLUORO;  Surgeon: Oretha Milch, MD;  Location: [facility] ENDOSCOPY;  Service: Cardiopulmonary;  Laterality: Bilateral;    Allergies  Allergen Reactions  . Lisinopril Cough     Outpatient Medications Prior to Visit  Medication Sig Dispense Refill  . albuterol (PROVENTIL HFA;VENTOLIN HFA) 108 (90  Base) MCG/ACT inhaler Inhale 2 puffs into the lungs every 6 (six) hours as needed for wheezing or shortness of breath. (Patient not taking: Reported on 06/14/2017) 1 Inhaler 5  . bisoprolol (ZEBETA) 5 MG tablet Take 1 tablet (5 mg total) by mouth daily. (Patient not taking: Reported on 06/14/2017) 90 tablet 3  . docusate sodium (COLACE) 100 MG capsule Take 1 capsule (100 mg total) by mouth every 12 (twelve) hours. (Patient not taking:  Reported on 06/14/2017) 30 capsule 0  . furosemide (LASIX) 40 MG tablet Take 1 tablet (40 mg total) by mouth daily. (Patient not taking: Reported on 06/14/2017) 90 tablet 3  . Glycopyrrolate-Formoterol (BEVESPI AEROSPHERE) 9-4.8 MCG/ACT AERO Inhale 2 puffs into the lungs 2 (two) times daily. (Patient not taking: Reported on 06/14/2017) 1 Inhaler 5  . HYDROcodone-acetaminophen (NORCO/VICODIN) 5-325 MG tablet Take 1-2 tablets by mouth every 6 (six) hours as needed. (Patient not taking: Reported on 06/14/2017) 10 tablet 0  . hyoscyamine (LEVSIN SL) 0.125 MG SL tablet Place 1 tablet (0.125 mg total) under the tongue every 8 (eight) hours as needed. (Patient not taking: Reported on 06/14/2017) 30 tablet 1  . OXYGEN 2lpm when needed per pt    . polyethylene glycol powder (GLYCOLAX/MIRALAX) powder Take 17 g by mouth 2 (two) times daily. Until daily soft stools  OTC (Patient not taking: Reported on 06/14/2017) 250 g 0  . sacubitril-valsartan (ENTRESTO) 24-26 MG Take 1 tablet by mouth 2 (two) times daily. (Patient not taking: Reported on 06/14/2017) 180 tablet 3  . spironolactone (ALDACTONE) 25 MG tablet Take 0.5 tablets (12.5 mg total) by mouth daily. (Patient not taking: Reported on 06/14/2017) 15 tablet 11  . warfarin (COUMADIN) 7.5 MG tablet Take as directed by coumadin clinic (Patient not taking: Reported on 06/14/2017) 35 tablet 0   No facility-administered medications prior to visit.     ROS Review of Systems  Constitutional: Negative for activity change and appetite change.  HENT: Negative for sinus pressure and sore throat.   Eyes: Negative for visual disturbance.  Respiratory: Negative for cough, chest tightness and shortness of breath.   Cardiovascular: Negative for chest pain and leg swelling.  Gastrointestinal: Negative for abdominal distention, abdominal pain, constipation and diarrhea.  Endocrine: Negative.   Genitourinary: Negative for dysuria.  Musculoskeletal: Negative for joint swelling and  myalgias.  Skin:       See hpi  Allergic/Immunologic: Negative.   Neurological: Negative for weakness, light-headedness and numbness.  Psychiatric/Behavioral: Negative for dysphoric mood and suicidal ideas.    Objective:  BP 103/62   Pulse 73   Temp 97.8 F (36.6 C) (Oral)   Ht 6\' 3"  (1.905 m)   Wt 196 lb 3.2 oz (89 kg)   SpO2 97%   BMI 24.52 kg/m   BP/Weight 06/14/2017 06/06/2017 06/05/2017  Systolic BP 103 136 -  Diastolic BP 62 84 -  Wt. (Lbs) 196.2 - 192.68  BMI 24.52 - 24.08      Physical Exam  Constitutional: He is oriented to person, place, and time. He appears well-developed and well-nourished.  Cardiovascular: Normal rate, normal heart sounds and intact distal pulses.  No murmur heard. Pulmonary/Chest: Effort normal and breath sounds normal. He has no wheezes. He has no rales. He exhibits no tenderness.  Abdominal: Soft. Bowel sounds are normal. He exhibits no distension and no mass. There is no tenderness.  Musculoskeletal: Normal range of motion.  Neurological: He is alert and oriented to person, place, and time.  Skin:  Perirectal  abscess  Psychiatric: He has a normal mood and affect.      CMP Latest Ref Rng & Units 06/06/2017 06/05/2017 06/04/2017  Glucose 65 - 99 mg/dL 355(D) 86 83  BUN 6 - 20 mg/dL 11 9 11   Creatinine 0.61 - 1.24 mg/dL 3.22 0.25 4.27  Sodium 135 - 145 mmol/L 138 137 138  Potassium 3.5 - 5.1 mmol/L 4.2 3.5 3.7  Chloride 101 - 111 mmol/L 98(L) 101 101  CO2 22 - 32 mmol/L 31 27 27   Calcium 8.9 - 10.3 mg/dL 9.1 0.6(C) 3.7(S)  Total Protein 6.5 - 8.1 g/dL - - 7.0  Total Bilirubin 0.3 - 1.2 mg/dL - - 0.9  Alkaline Phos 38 - 126 U/L - - 63  AST 15 - 41 U/L - - 20  ALT 17 - 63 U/L - - 10(L)    Lipid Panel     Component Value Date/Time   CHOL 147 01/11/2016 0446   TRIG 69 01/11/2016 0446   HDL 53 01/11/2016 0446   CHOLHDL 2.8 01/11/2016 0446   VLDL 14 01/11/2016 0446   LDLCALC 80 01/11/2016 0446   .  CBC    Component Value  Date/Time   WBC 15.1 (H) 06/06/2017 0643   RBC 4.51 06/06/2017 0643   HGB 13.6 06/06/2017 0643   HCT 41.2 06/06/2017 0643   PLT 225 06/06/2017 0643   MCV 91.4 06/06/2017 0643   MCH 30.2 06/06/2017 0643   MCHC 33.0 06/06/2017 0643   RDW 12.8 06/06/2017 0643   LYMPHSABS 2.2 06/06/2017 0643   MONOABS 1.2 (H) 06/06/2017 0643   EOSABS 0.0 06/06/2017 0643   BASOSABS 0.0 06/06/2017 0643    Assessment & Plan:   1. 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 Perirectal abscess Dressing change performed in the clinic Advised to keep appointment with general surgery  3. Atrial flutter, unspecified type (HCC) Non Compliant with Coumadin I have refilled his Coumadin and he has been advised to keep his upcoming appointment at the Coumadin clinic where his INR will be monitored Will need to follow-up with cardiology regarding plans for possible DCCV  4. COPD GOLD III/still smoking  Has been out of all his inhalers and will be picking them up today from the pharmacy  5. Noncompliance We have discussed implications of noncompliance but the patient is nonchalant   Meds ordered this encounter  Medications  . warfarin (COUMADIN) 7.5 MG tablet    Sig: Take as directed by coumadin clinic    Dispense:  30 tablet    Refill:  0    This is 1 month supply    Follow-up: Return in about 1 month (around 07/15/2017) for coordination of care.   Jaclyn Shaggy MD

## 2017-06-14 NOTE — Telephone Encounter (Signed)
Met with patient briefly while he was in the office to see provider. Patient stated that he had informed CMA that he didn't have transportation to his visit to the coumadin clinic on Tuesday 1/8. Spoke with Chanel, office administrator and she gave the approved to have [employer] (transportation) take patient to his appointment.

## 2017-06-14 NOTE — Progress Notes (Signed)
Pt has been out of medications for 1 week. Pt is not aware of what medications he's taking and not taking.

## 2017-06-17 ENCOUNTER — Telehealth: Payer: Self-pay

## 2017-06-17 NOTE — Telephone Encounter (Signed)
Call placed to the coumadin clinic - spoke to [facility].explained to her that we have not been able to reach the patient today to confirm his transportation to the coumadin clinic tomorrow. He has an appointment at 0745. [facility] will not arrange for cab transport until the patient confirms his address and that he will be going to his appointment. Phone calls to his phone are dropped and a message was left with his sister but he has not called back yet.  She noted that his appointment will need to be rescheduled. When [facility] is able to contact him, Tiffany at Coumadin clinic # [phone] can be contacted to arrange for follow up.

## 2017-06-17 NOTE — Telephone Encounter (Signed)
Attempted to contact the patient to remind him of his appointment at the coumadin clinic tomorrow - 06/18/17. Call placed to # [phone] x [phone] and the calls were dropped after 6/7 rings respectively.  Call placed to his sister, Tinnie Gens and informed her that the patient's calls are dropped and requested that he call this CM back. She stated that she would give him the message.

## 2017-06-18 ENCOUNTER — Telehealth: Payer: Self-pay

## 2017-06-18 ENCOUNTER — Ambulatory Visit (INDEPENDENT_AMBULATORY_CARE_PROVIDER_SITE_OTHER): Payer: Self-pay | Admitting: *Deleted

## 2017-06-18 DIAGNOSIS — I4892 Unspecified atrial flutter: Secondary | ICD-10-CM

## 2017-06-18 DIAGNOSIS — Z5181 Encounter for therapeutic drug level monitoring: Secondary | ICD-10-CM

## 2017-06-18 DIAGNOSIS — I483 Typical atrial flutter: Secondary | ICD-10-CM

## 2017-06-18 LAB — POCT INR: INR: 1.2

## 2017-06-18 NOTE — Patient Instructions (Signed)
Description   Has already take 7.5mg  today jan 8th  instructed to take another 1/2 tablet(3.75mg ) making total of 11.25mg  today then tomorrow Jan 9th  take 1 and 1/2 tablets (11.25mg ) then continue on same dose  1 tablet daily except 1 and 1/2 tablets on Mondays and Thursdays.  Recheck INR in 1 week.  Call with any questions new medications or if scheduled for any new procedures [phone]

## 2017-06-18 NOTE — Telephone Encounter (Signed)
It was noted that the patient was seen in the coumadin clinic today and has an appointment for an INR check next week - 06/25/17.   The patient did not contact [facility] for transportation to the appointment today.

## 2017-06-25 ENCOUNTER — Ambulatory Visit (INDEPENDENT_AMBULATORY_CARE_PROVIDER_SITE_OTHER): Payer: Self-pay | Admitting: *Deleted

## 2017-06-25 DIAGNOSIS — I483 Typical atrial flutter: Secondary | ICD-10-CM

## 2017-06-25 DIAGNOSIS — Z5181 Encounter for therapeutic drug level monitoring: Secondary | ICD-10-CM

## 2017-06-25 LAB — POCT INR: INR: 2.9

## 2017-06-25 NOTE — Patient Instructions (Signed)
Description   Continue on same dose  1 tablet daily except 1 and 1/2 tablets on Mondays and Thursdays.  Recheck INR in 2  weeks.  Call with any questions new medications or if scheduled for any new procedures [phone]

## 2017-07-11 ENCOUNTER — Encounter: Payer: Self-pay | Admitting: Internal Medicine

## 2017-07-11 ENCOUNTER — Other Ambulatory Visit: Payer: Self-pay | Admitting: Pharmacist

## 2017-07-11 ENCOUNTER — Ambulatory Visit (INDEPENDENT_AMBULATORY_CARE_PROVIDER_SITE_OTHER): Payer: Self-pay | Admitting: Internal Medicine

## 2017-07-11 ENCOUNTER — Ambulatory Visit (INDEPENDENT_AMBULATORY_CARE_PROVIDER_SITE_OTHER): Payer: Self-pay | Admitting: *Deleted

## 2017-07-11 VITALS — BP 140/84 | HR 63 | Ht 75.0 in | Wt 197.0 lb

## 2017-07-11 DIAGNOSIS — I5022 Chronic systolic (congestive) heart failure: Secondary | ICD-10-CM

## 2017-07-11 DIAGNOSIS — E782 Mixed hyperlipidemia: Secondary | ICD-10-CM

## 2017-07-11 DIAGNOSIS — Z5181 Encounter for therapeutic drug level monitoring: Secondary | ICD-10-CM

## 2017-07-11 DIAGNOSIS — I2510 Atherosclerotic heart disease of native coronary artery without angina pectoris: Secondary | ICD-10-CM

## 2017-07-11 DIAGNOSIS — I483 Typical atrial flutter: Secondary | ICD-10-CM

## 2017-07-11 DIAGNOSIS — I10 Essential (primary) hypertension: Secondary | ICD-10-CM

## 2017-07-11 LAB — POCT INR: INR: 2.3

## 2017-07-11 MED ORDER — SPIRONOLACTONE 25 MG PO TABS: 12.5000 mg | ORAL_TABLET | Freq: Every day | ORAL | 11 refills | Status: DC

## 2017-07-11 MED ORDER — FUROSEMIDE 40 MG PO TABS: 40.0000 mg | ORAL_TABLET | Freq: Every day | ORAL | 11 refills | Status: DC

## 2017-07-11 MED ORDER — BISOPROLOL FUMARATE 5 MG PO TABS: 5.0000 mg | ORAL_TABLET | Freq: Every day | ORAL | 11 refills | Status: DC

## 2017-07-11 MED ORDER — SACUBITRIL-VALSARTAN 49-51 MG PO TABS: 1.0000 | ORAL_TABLET | Freq: Two times a day (BID) | ORAL | 11 refills | Status: DC

## 2017-07-11 MED ORDER — WARFARIN SODIUM 7.5 MG PO TABS: ORAL_TABLET | ORAL | 1 refills | Status: DC

## 2017-07-11 MED FILL — **ENTRESTO 49-51 MG TABLET: 49-51 | 14 days supply | Qty: 28 | Fill #0

## 2017-07-11 MED FILL — BISOPROLOL FUMARATE 5 MG TA: 5 | 30 days supply | Qty: 30 | Fill #6

## 2017-07-11 MED FILL — SPIRONOLACTONE 25 MG TABS: 25 | 30 days supply | Qty: 15 | Fill #4

## 2017-07-11 MED FILL — ?FUROSEMIDE 40 MG TABLET: 40 | 30 days supply | Qty: 30 | Fill #3

## 2017-07-11 MED FILL — WARFARIN SODIUM 7.5 MG TAB: 7.5 | 30 days supply | Qty: 36 | Fill #0

## 2017-07-11 NOTE — Patient Instructions (Signed)
Your physician has recommended you make the following change in your medication:  1.) increase Entresto dose to 49/51 two times per day  Your physician recommends that you schedule a follow-up appointment in: May, 2019 with Dr. Tenny Craw.

## 2017-07-11 NOTE — Patient Instructions (Signed)
Description   Continue on same dose  1 tablet daily except 1 and 1/2 tablets on Mondays and Thursdays.  Recheck INR in 4  weeks.  Call with any questions new medications or if scheduled for any new procedures [phone]

## 2017-07-11 NOTE — Progress Notes (Signed)
Cardiology Office Note   Date:  07/11/2017   ID:  Jared [name], DOB November 16, 1955, MRN [id number]  PCP:  [name], MD  Cardiologist:   Dietrich Pates, MD   F/U of systolic CHF   History of Present Illness: Jared [name] is a 62 y.o. male with a history of HTN, tobacco abuse, mild CAD, CVA.  LVEf 45 to 50%  I saw him in clinic in March 2016.  Had some chest tightness  I recomm a Lexiscan myoview  This showed normal perfusion  Echo showed LVEF 40 to 45%.    I saw him last summer (August)    Notes occasional CP  Occur usually with sitting Breathing is stable  No dizziness No edema    Current Outpatient Medications  Medication Sig Dispense Refill  . albuterol (PROVENTIL HFA;VENTOLIN HFA) 108 (90 Base) MCG/ACT inhaler Inhale 2 puffs into the lungs every 6 (six) hours as needed for wheezing or shortness of breath. 1 Inhaler 5  . bisoprolol (ZEBETA) 5 MG tablet Take 1 tablet (5 mg total) by mouth daily. 90 tablet 3  . docusate sodium (COLACE) 100 MG capsule Take 1 capsule (100 mg total) by mouth every 12 (twelve) hours. 30 capsule 0  . furosemide (LASIX) 40 MG tablet Take 1 tablet (40 mg total) by mouth daily. 90 tablet 3  . Glycopyrrolate-Formoterol (BEVESPI AEROSPHERE) 9-4.8 MCG/ACT AERO Inhale 2 puffs into the lungs 2 (two) times daily. 1 Inhaler 5  . HYDROcodone-acetaminophen (NORCO/VICODIN) 5-325 MG tablet Take 1-2 tablets by mouth every 6 (six) hours as needed. 10 tablet 0  . hyoscyamine (LEVSIN SL) 0.125 MG SL tablet Place 1 tablet (0.125 mg total) under the tongue every 8 (eight) hours as needed. 30 tablet 1  . OXYGEN 2lpm when needed per pt    . polyethylene glycol powder (GLYCOLAX/MIRALAX) powder Take 17 g by mouth 2 (two) times daily. Until daily soft stools  OTC 250 g 0  . sacubitril-valsartan (ENTRESTO) 24-26 MG Take 1 tablet by mouth 2 (two) times daily. 180 tablet 3  . spironolactone (ALDACTONE) 25 MG tablet Take 0.5 tablets (12.5 mg total) by mouth daily. 15 tablet 11  .  warfarin (COUMADIN) 7.5 MG tablet Take as directed by coumadin clinic 36 tablet 1   No current facility-administered medications for this visit.     Allergies:   Lisinopril   Past Medical History:  Diagnosis Date  . CAD (coronary artery disease)    a. LHC 5/12:  LAD 20, pLCx 20, pRCA 40, dRCA 40, EF 35%, diff HK  //  b. Myoview 4/16: Overall Impression: High risk stress nuclear study There is no evidence of ischemia. There is severe LV dysfunction. LV Ejection Fraction: 30%. LV Wall Motion: There is global LV hypokinesis.   Marland Kitchen CAP (community acquired pneumonia) 09/2013  . Chronic combined systolic and diastolic CHF (congestive heart failure) (HCC)    a. Echo 4/16:Mild LVH, EF 40-45%, diffuse HK //  b. Echo 8/17: EF 35-40%, diffuse HK, diastolic dysfunction, aortic sclerosis, trivial MR, moderate LAE, normal RVSF, moderate RAE, mild TR, PASP 42 mmHg // c. Echo 4/18: Mild concentric LVH, EF 30-35, normal wall motion, grade 1 diastolic dysfunction, PASP 49  . Cluster headache    "hx; haven't had one in awhile" (01/09/2016)  . COPD (chronic obstructive pulmonary disease) (HCC)    [name] 01/09/2016  . History of CVA (cerebrovascular accident)   . Hypertension   . NICM (nonischemic cardiomyopathy) (HCC)   . Tobacco abuse  Past Surgical History:  Procedure Laterality Date  . CARDIAC CATHETERIZATION  10/2010   LM normal, LAD with 20% irregularities, LCX with 20%, RCA with 40% prox and 40% distal - EF of 35%  . COLONOSCOPY W/ BIOPSIES AND POLYPECTOMY    . EXCISION MASS HEAD    . INCISION AND DRAINAGE PERIRECTAL ABSCESS N/A 06/05/2017   Procedure: IRRIGATION AND DEBRIDEMENT PERIRECTAL ABSCESS;  Surgeon: Andria Meuse, MD;  Location: [facility] OR;  Service: General;  Laterality: N/A;  . VIDEO BRONCHOSCOPY Bilateral 05/08/2016   Procedure: VIDEO BRONCHOSCOPY WITH FLUORO;  Surgeon: Oretha Milch, MD;  Location: [facility] END[facility]COPY;  Service: Cardiopulmonary;  Laterality: Bilateral;     Social  History:  The patient  reports that he has been smoking cigarettes.  He has a 10.50 pack-year smoking history. he has never used smokeless tobacco. He reports that he drinks alcohol. He reports that he does not use drugs.   Family History:  The patient's family history includes Cancer in his father; Colon cancer in his mother; Diabetes in his brother, mother, and sister; Heart disease in his mother; Liver cancer in his mother.    ROS:  Please see the history of present illness. All other systems are reviewed and  Negative to the above problem except as noted.    PHYSICAL EXAM: VS:  BP 140/84   Pulse 63   Ht 6\' 3"  (1.905 m)   Wt 197 lb (89.4 kg)   SpO2 97%   BMI 24.62 kg/m   GEN: Well nourished, well developed, in no acute distress  HEENT: normal  Neck: JVP is normal  carotid bruits, or masses Cardiac: RRR; no murmurs, rubs, or gallops,no edema  Respiratory:  clear to auscultation bilaterally, normal work of breathing GI: soft, nontender, nondistended, + BS  No hepatomegaly  MS: no deformity Moving all extremities   Skin: warm and dry, no rash Neuro:  Strength and sensation are intact Psych: euthymic mood, full affect   EKG:  EKG is not  ordered today.   Lipid Panel    Component Value Date/Time   CHOL 147 01/11/2016 0446   TRIG 69 01/11/2016 0446   HDL 53 01/11/2016 0446   CHOLHDL 2.8 01/11/2016 0446   VLDL 14 01/11/2016 0446   LDLCALC 80 01/11/2016 0446      Wt Readings from Last 3 Encounters:  07/11/17 197 lb (89.4 kg)  06/14/17 196 lb 3.2 oz (89 kg)  06/05/17 192 lb 10.9 oz (87.4 kg)      ASSESSMENT AND PLAN:  1.  Chronic systolic CHF  Volume status appears OK   BP on my check is 140/84   Would increase Entresto to 49/51  2.  Mild CAD Asymptomatic  Follow    3.  HTN  BP is up a little  Wii increase Entrestor  4  HL  Check lipids today  Not on statin    F/U later this spring         Signed, Zane Pellecchia, MD  07/11/2017 11:12 AM    [facility] [address] [place], [place], [place]  [address] Phone: [phone]; Fax: [phone]

## 2017-07-15 ENCOUNTER — Ambulatory Visit: Payer: Self-pay | Attending: Family Medicine | Admitting: Family Medicine

## 2017-07-15 ENCOUNTER — Encounter: Payer: Self-pay | Admitting: Family Medicine

## 2017-07-15 VITALS — BP 131/78 | HR 70 | Temp 98.0°F | Ht 75.0 in | Wt 199.8 lb

## 2017-07-15 DIAGNOSIS — I10 Essential (primary) hypertension: Secondary | ICD-10-CM

## 2017-07-15 DIAGNOSIS — I110 Hypertensive heart disease with heart failure: Secondary | ICD-10-CM | POA: Insufficient documentation

## 2017-07-15 DIAGNOSIS — I420 Dilated cardiomyopathy: Secondary | ICD-10-CM

## 2017-07-15 DIAGNOSIS — Z79899 Other long term (current) drug therapy: Secondary | ICD-10-CM | POA: Insufficient documentation

## 2017-07-15 DIAGNOSIS — I4892 Unspecified atrial flutter: Secondary | ICD-10-CM

## 2017-07-15 DIAGNOSIS — J449 Chronic obstructive pulmonary disease, unspecified: Secondary | ICD-10-CM

## 2017-07-15 DIAGNOSIS — F17210 Nicotine dependence, cigarettes, uncomplicated: Secondary | ICD-10-CM | POA: Insufficient documentation

## 2017-07-15 DIAGNOSIS — I5042 Chronic combined systolic (congestive) and diastolic (congestive) heart failure: Secondary | ICD-10-CM | POA: Insufficient documentation

## 2017-07-15 DIAGNOSIS — I2510 Atherosclerotic heart disease of native coronary artery without angina pectoris: Secondary | ICD-10-CM | POA: Insufficient documentation

## 2017-07-15 DIAGNOSIS — Z7901 Long term (current) use of anticoagulants: Secondary | ICD-10-CM | POA: Insufficient documentation

## 2017-07-15 DIAGNOSIS — Z8673 Personal history of transient ischemic attack (TIA), and cerebral infarction without residual deficits: Secondary | ICD-10-CM | POA: Insufficient documentation

## 2017-07-15 NOTE — Patient Instructions (Signed)
Steps to Quit Smoking Smoking tobacco can be bad for your health. It can also affect almost every organ in your body. Smoking puts you and people around you at risk for many serious long-lasting (chronic) diseases. Quitting smoking is hard, but it is one of the best things that you can do for your health. It is never too late to quit. What are the benefits of quitting smoking? When you quit smoking, you lower your risk for getting serious diseases and conditions. They can include:  Lung cancer or lung disease.  Heart disease.  Stroke.  Heart attack.  Not being able to have children (infertility).  Weak bones (osteoporosis) and broken bones (fractures).  If you have coughing, wheezing, and shortness of breath, those symptoms may get better when you quit. You may also get sick less often. If you are pregnant, quitting smoking can help to lower your chances of having a baby of low birth weight. What can I do to help me quit smoking? Talk with your doctor about what can help you quit smoking. Some things you can do (strategies) include:  Quitting smoking totally, instead of slowly cutting back how much you smoke over a period of time.  Going to in-person counseling. You are more likely to quit if you go to many counseling sessions.  Using resources and support systems, such as: ? Online chats with a counselor. ? Phone quitlines. ? Printed self-help materials. ? Support groups or group counseling. ? Text messaging programs. ? Mobile phone apps or applications.  Taking medicines. Some of these medicines may have nicotine in them. If you are pregnant or breastfeeding, do not take any medicines to quit smoking unless your doctor says it is okay. Talk with your doctor about counseling or other things that can help you.  Talk with your doctor about using more than one strategy at the same time, such as taking medicines while you are also going to in-person counseling. This can help make  quitting easier. What things can I do to make it easier to quit? Quitting smoking might feel very hard at first, but there is a lot that you can do to make it easier. Take these steps:  Talk to your family and friends. Ask them to support and encourage you.  Call phone quitlines, reach out to support groups, or work with a counselor.  Ask people who smoke to not smoke around you.  Avoid places that make you want (trigger) to smoke, such as: ? Bars. ? Parties. ? Smoke-break areas at work.  Spend time with people who do not smoke.  Lower the stress in your life. Stress can make you want to smoke. Try these things to help your stress: ? Getting regular exercise. ? Deep-breathing exercises. ? Yoga. ? Meditating. ? Doing a body scan. To do this, close your eyes, focus on one area of your body at a time from head to toe, and notice which parts of your body are tense. Try to relax the muscles in those areas.  Download or buy apps on your mobile phone or tablet that can help you stick to your quit plan. There are many free apps, such as QuitGuide from the CDC (Centers for Disease Control and Prevention). You can find more support from smokefree.gov and other websites.  This information is not intended to replace advice given to you by your health care provider. Make sure you discuss any questions you have with your health care provider. Document Released: 03/24/2009 Document   Revised: 01/24/2016 Document Reviewed: 10/12/2014 Elsevier Interactive Patient Education  2018 [employer] Inc.  

## 2017-07-15 NOTE — Progress Notes (Signed)
Subjective:  Patient ID: Jared [name], male    DOB: 1955-09-04  Age: 62 y.o. MRN: [id number]  CC: COPD  HPI Jared [name] is a 62 year old male with a history of hypertension, COPD, tobacco abuse, NICM , CHF (EF 30-35% from 2-D echo 09/2016), atrial flutter who comes in today for a follow-up visit and coordination of care as he had been lost to cardiology follow-up on INR checks since 12/2016  Since his last visit he has been seen by cardiology on 07/11/17 at which time the dose of his Sherryll Burger was increased. He has been compliant with his INR checks at the Coumadin clinic and his last INR was 2.3 on 07/11/17. He denies shortness of breath, chest pains or palpitations.  He has no acute concerns today and has been using his Proventil MDI but does not use his  Bevespi as he informs me that this was discontinued by his pulmonologist but he continues to smoke 4-5 cigarettes/day.   His rectal abscess has improved significantly with very minimal drainage he states.  Past Medical History:  Diagnosis Date  . CAD (coronary artery disease)    a. LHC 5/12:  LAD 20, pLCx 20, pRCA 40, dRCA 40, EF 35%, diff HK  //  b. Myoview 4/16: Overall Impression: High risk stress nuclear study There is no evidence of ischemia. There is severe LV dysfunction. LV Ejection Fraction: 30%. LV Wall Motion: There is global LV hypokinesis.   Marland Kitchen CAP (community acquired pneumonia) 09/2013  . Chronic combined systolic and diastolic CHF (congestive heart failure) (HCC)    a. Echo 4/16:Mild LVH, EF 40-45%, diffuse HK //  b. Echo 8/17: EF 35-40%, diffuse HK, diastolic dysfunction, aortic sclerosis, trivial MR, moderate LAE, normal RVSF, moderate RAE, mild TR, PASP 42 mmHg // c. Echo 4/18: Mild concentric LVH, EF 30-35, normal wall motion, grade 1 diastolic dysfunction, PASP 49  . Cluster headache    "hx; haven't had one in awhile" (01/09/2016)  . COPD (chronic obstructive pulmonary disease) (HCC)    [name] 01/09/2016  . History of  CVA (cerebrovascular accident)   . Hypertension   . NICM (nonischemic cardiomyopathy) (HCC)   . Tobacco abuse     Past Surgical History:  Procedure Laterality Date  . CARDIAC CATHETERIZATION  10/2010   LM normal, LAD with 20% irregularities, LCX with 20%, RCA with 40% prox and 40% distal - EF of 35%  . COLONOSCOPY W/ BIOPSIES AND POLYPECTOMY    . EXCISION MASS HEAD    . INCISION AND DRAINAGE PERIRECTAL ABSCESS N/A 06/05/2017   Procedure: IRRIGATION AND DEBRIDEMENT PERIRECTAL ABSCESS;  Surgeon: Andria Meuse, MD;  Location: [facility] OR;  Service: General;  Laterality: N/A;  . VIDEO BRONCHOSCOPY Bilateral 05/08/2016   Procedure: VIDEO BRONCHOSCOPY WITH FLUORO;  Surgeon: Oretha Milch, MD;  Location: [facility];  Service: Cardiopulmonary;  Laterality: Bilateral;    Allergies  Allergen Reactions  . Lisinopril Cough     Outpatient Medications Prior to Visit  Medication Sig Dispense Refill  . albuterol (PROVENTIL HFA;VENTOLIN HFA) 108 (90 Base) MCG/ACT inhaler Inhale 2 puffs into the lungs every 6 (six) hours as needed for wheezing or shortness of breath. 1 Inhaler 5  . bisoprolol (ZEBETA) 5 MG tablet Take 1 tablet (5 mg total) by mouth daily. 30 tablet 11  . docusate sodium (COLACE) 100 MG capsule Take 1 capsule (100 mg total) by mouth every 12 (twelve) hours. 30 capsule 0  . furosemide (LASIX) 40 MG tablet Take 1 tablet (40  mg total) by mouth daily. 30 tablet 11  . Glycopyrrolate-Formoterol (BEVESPI AEROSPHERE) 9-4.8 MCG/ACT AERO Inhale 2 puffs into the lungs 2 (two) times daily. 1 Inhaler 5  . hyoscyamine (LEVSIN SL) 0.125 MG SL tablet Place 1 tablet (0.125 mg total) under the tongue every 8 (eight) hours as needed. 30 tablet 1  . OXYGEN 2lpm when needed per pt    . polyethylene glycol powder (GLYCOLAX/MIRALAX) powder Take 17 g by mouth 2 (two) times daily. Until daily soft stools  OTC 250 g 0  . sacubitril-valsartan (ENTRESTO) 49-51 MG Take 1 tablet by mouth 2 (two) times daily.  60 tablet 11  . spironolactone (ALDACTONE) 25 MG tablet Take 0.5 tablets (12.5 mg total) by mouth daily. 15 tablet 11  . warfarin (COUMADIN) 7.5 MG tablet Take as directed by coumadin clinic 36 tablet 1  . HYDROcodone-acetaminophen (NORCO/VICODIN) 5-325 MG tablet Take 1-2 tablets by mouth every 6 (six) hours as needed. (Patient not taking: Reported on 07/15/2017) 10 tablet 0   No facility-administered medications prior to visit.     ROS Review of Systems  Constitutional: Negative for activity change and appetite change.  HENT: Negative for sinus pressure and sore throat.   Eyes: Negative for visual disturbance.  Respiratory: Negative for cough, chest tightness and shortness of breath.   Cardiovascular: Negative for chest pain and leg swelling.  Gastrointestinal: Negative for abdominal distention, abdominal pain, constipation and diarrhea.  Endocrine: Negative.   Genitourinary: Negative for dysuria.  Musculoskeletal: Negative for joint swelling and myalgias.  Skin: Negative for rash.  Allergic/Immunologic: Negative.   Neurological: Negative for weakness, light-headedness and numbness.  Psychiatric/Behavioral: Negative for dysphoric mood and suicidal ideas.    Objective:  BP 131/78   Pulse 70   Temp 98 F (36.7 C) (Oral)   Ht 6\' 3"  (1.905 m)   Wt 199 lb 12.8 oz (90.6 kg)   SpO2 97%   BMI 24.97 kg/m   BP/Weight 07/15/2017 07/11/2017 06/14/2017  Systolic BP 131 140 103  Diastolic BP 78 84 62  Wt. (Lbs) 199.8 197 196.2  BMI 24.97 24.62 24.52    Wt Readings from Last 3 Encounters:  07/15/17 199 lb 12.8 oz (90.6 kg)  07/11/17 197 lb (89.4 kg)  06/14/17 196 lb 3.2 oz (89 kg)      Physical Exam  Constitutional: He is oriented to person, place, and time. He appears well-developed and well-nourished.  Cardiovascular: Normal rate, normal heart sounds and intact distal pulses.  No murmur heard. Pulmonary/Chest: Effort normal and breath sounds normal. He has no wheezes. He has no  rales. He exhibits no tenderness.  Abdominal: Soft. Bowel sounds are normal. He exhibits no distension and no mass. There is no tenderness.  Musculoskeletal: Normal range of motion.  Neurological: He is alert and oriented to person, place, and time.  Skin: Skin is warm and dry.  Psychiatric: He has a normal mood and affect.     Assessment & Plan:   1. 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 Atrial flutter, unspecified type (HCC) On anticoagulation with Coumadin, last INR was 2.3 Rate control with bisoprolol Follow-up with the Coumadin clinic  3. Essential hypertension Controlled Counseled on blood pressure goal of less than 130/80, low-sodium, DASH diet, medication compliance, 150 minutes of moderate intensity exercise per week. Discussed medication compliance, adverse effects.   4. COPD GOLD III/still smoking  Currently  on  albuterol Does not use Bevespi -informs me this was discontinued by pulmonary Advised that smoking cessation will retard progression of condition; discussed the dangers of ongoing tobacco use.   No orders of the defined types were placed in this encounter.   Follow-up: Return in about 3 months (around 10/12/2017) for Follow-up of chronic medical conditions.   [name] MD

## 2017-08-08 ENCOUNTER — Ambulatory Visit (INDEPENDENT_AMBULATORY_CARE_PROVIDER_SITE_OTHER): Payer: Self-pay | Admitting: *Deleted

## 2017-08-08 DIAGNOSIS — I4892 Unspecified atrial flutter: Secondary | ICD-10-CM

## 2017-08-08 DIAGNOSIS — Z5181 Encounter for therapeutic drug level monitoring: Secondary | ICD-10-CM

## 2017-08-08 LAB — POCT INR: INR: 4.3

## 2017-08-08 NOTE — Patient Instructions (Signed)
Description   Do not take any Coumadin tomorrow and take 1/2 tablet tomorrow then continue on same dose 1 tablet daily except 1 and 1/2 tablets on Mondays and Thursdays.  Recheck INR in 2  weeks.  Call with any questions new medications or if scheduled for any new procedures [phone]

## 2017-08-20 MED FILL — SPIRONOLACTONE 25 MG TABS: 25 | 30 days supply | Qty: 15 | Fill #5

## 2017-08-20 MED FILL — WARFARIN SODIUM 7.5 MG TAB: 7.5 | 30 days supply | Qty: 36 | Fill #1

## 2017-08-20 MED FILL — BISOPROLOL FUMARATE 5 MG TA: 5 | 30 days supply | Qty: 30 | Fill #7

## 2017-08-21 ENCOUNTER — Other Ambulatory Visit: Payer: Self-pay | Admitting: *Deleted

## 2017-08-21 DIAGNOSIS — I2510 Atherosclerotic heart disease of native coronary artery without angina pectoris: Secondary | ICD-10-CM

## 2017-08-22 ENCOUNTER — Other Ambulatory Visit: Payer: Self-pay

## 2017-08-26 ENCOUNTER — Ambulatory Visit (INDEPENDENT_AMBULATORY_CARE_PROVIDER_SITE_OTHER): Payer: Self-pay | Admitting: Pharmacist

## 2017-08-26 ENCOUNTER — Other Ambulatory Visit: Payer: Self-pay | Admitting: *Deleted

## 2017-08-26 DIAGNOSIS — Z5181 Encounter for therapeutic drug level monitoring: Secondary | ICD-10-CM

## 2017-08-26 DIAGNOSIS — I4892 Unspecified atrial flutter: Secondary | ICD-10-CM

## 2017-08-26 DIAGNOSIS — I2510 Atherosclerotic heart disease of native coronary artery without angina pectoris: Secondary | ICD-10-CM

## 2017-08-26 LAB — LIPID PANEL
Chol/HDL Ratio: 3.9 ratio (ref 0.0–5.0)
Cholesterol, Total: 206 mg/dL — ABNORMAL HIGH (ref 100–199)
HDL: 53 mg/dL (ref >39)
LDL Calculated: 119 mg/dL — ABNORMAL HIGH (ref 0–99)
Triglycerides: 168 mg/dL — ABNORMAL HIGH (ref 0–149)
VLDL Cholesterol Cal: 34 mg/dL (ref 5–40)

## 2017-08-26 LAB — POCT INR: INR: 1.3

## 2017-08-26 NOTE — Patient Instructions (Signed)
Description   Today take an extra 1/2 tablet (since already took 1.5 tablets), tomorrow take 1.5 tablets then continue on same dose 1 tablet daily except 1 and 1/2 tablets on Mondays and Thursdays.  Recheck INR in 10 days.  Call with any questions new medications or if scheduled for any new procedures [phone]

## 2017-08-27 ENCOUNTER — Other Ambulatory Visit: Payer: Self-pay | Admitting: *Deleted

## 2017-08-27 MED ORDER — ROSUVASTATIN CALCIUM 10 MG PO TABS: 10.0000 mg | ORAL_TABLET | Freq: Every day | ORAL | 6 refills | Status: DC

## 2017-09-04 ENCOUNTER — Encounter (INDEPENDENT_AMBULATORY_CARE_PROVIDER_SITE_OTHER): Payer: Self-pay

## 2017-09-04 ENCOUNTER — Ambulatory Visit (INDEPENDENT_AMBULATORY_CARE_PROVIDER_SITE_OTHER): Payer: Self-pay | Admitting: *Deleted

## 2017-09-04 DIAGNOSIS — I4892 Unspecified atrial flutter: Secondary | ICD-10-CM

## 2017-09-04 DIAGNOSIS — Z5181 Encounter for therapeutic drug level monitoring: Secondary | ICD-10-CM

## 2017-09-04 LAB — POCT INR: INR: 2.6

## 2017-09-04 NOTE — Patient Instructions (Signed)
Description   Continue taking 1 tablet daily except 1 and 1/2 tablets on Mondays and Thursdays.  Recheck INR in 3 weeks.  Call with any questions new medications or if scheduled for any new procedures [phone]

## 2017-10-14 ENCOUNTER — Ambulatory Visit: Payer: Self-pay | Admitting: Family Medicine

## 2017-10-14 ENCOUNTER — Ambulatory Visit: Payer: Self-pay | Admitting: Internal Medicine

## 2017-10-16 ENCOUNTER — Encounter: Payer: Self-pay | Admitting: Internal Medicine

## 2017-11-12 ENCOUNTER — Telehealth: Payer: Self-pay | Admitting: *Deleted

## 2017-11-12 DIAGNOSIS — R918 Other nonspecific abnormal finding of lung field: Secondary | ICD-10-CM

## 2017-11-12 DIAGNOSIS — R911 Solitary pulmonary nodule: Secondary | ICD-10-CM

## 2017-11-12 NOTE — Telephone Encounter (Signed)
-----   Message from Nyoka Cowden, MD sent at 06/26/2016  1:09 PM EST ----- F/u ct due f/u spn

## 2017-11-12 NOTE — Telephone Encounter (Signed)
Spoke with the pt and notified ct due  He verbalized understanding  Order sent to [facility]

## 2017-12-25 ENCOUNTER — Ambulatory Visit (INDEPENDENT_AMBULATORY_CARE_PROVIDER_SITE_OTHER)
Admission: RE | Admit: 2017-12-25 | Discharge: 2017-12-25 | Disposition: A | Discharge status: Home / Self Care | Payer: Self-pay | Source: Ambulatory Visit | Attending: Internal Medicine | Admitting: Internal Medicine

## 2017-12-25 DIAGNOSIS — R918 Other nonspecific abnormal finding of lung field: Secondary | ICD-10-CM

## 2017-12-25 DIAGNOSIS — R911 Solitary pulmonary nodule: Secondary | ICD-10-CM

## 2018-03-30 ENCOUNTER — Emergency Department (HOSPITAL_COMMUNITY)
Admission: EM | Admit: 2018-03-30 | Discharge: 2018-03-30 | Disposition: A | Discharge status: Home / Self Care | Payer: Medicaid Other | Attending: Emergency Medicine | Admitting: Emergency Medicine

## 2018-03-30 ENCOUNTER — Encounter (HOSPITAL_COMMUNITY): Payer: Self-pay | Admitting: Emergency Medicine

## 2018-03-30 DIAGNOSIS — J432 Centrilobular emphysema: Secondary | ICD-10-CM | POA: Insufficient documentation

## 2018-03-30 DIAGNOSIS — I110 Hypertensive heart disease with heart failure: Secondary | ICD-10-CM | POA: Diagnosis not present

## 2018-03-30 DIAGNOSIS — J9611 Chronic respiratory failure with hypoxia: Secondary | ICD-10-CM | POA: Insufficient documentation

## 2018-03-30 DIAGNOSIS — K610 Anal abscess: Secondary | ICD-10-CM | POA: Diagnosis not present

## 2018-03-30 DIAGNOSIS — K611 Rectal abscess: Secondary | ICD-10-CM | POA: Diagnosis present

## 2018-03-30 DIAGNOSIS — I5042 Chronic combined systolic (congestive) and diastolic (congestive) heart failure: Secondary | ICD-10-CM | POA: Insufficient documentation

## 2018-03-30 DIAGNOSIS — I2510 Atherosclerotic heart disease of native coronary artery without angina pectoris: Secondary | ICD-10-CM | POA: Insufficient documentation

## 2018-03-30 DIAGNOSIS — Z79899 Other long term (current) drug therapy: Secondary | ICD-10-CM | POA: Diagnosis not present

## 2018-03-30 DIAGNOSIS — F17210 Nicotine dependence, cigarettes, uncomplicated: Secondary | ICD-10-CM | POA: Diagnosis not present

## 2018-03-30 DIAGNOSIS — I4892 Unspecified atrial flutter: Secondary | ICD-10-CM | POA: Diagnosis not present

## 2018-03-30 MED ORDER — OXYCODONE-ACETAMINOPHEN 5-325 MG PO TABS
1.0000 | ORAL_TABLET | Freq: Once | ORAL | Status: AC
  Administered 2018-03-30: 1 via ORAL
  Filled 2018-03-30: qty 1

## 2018-03-30 MED ORDER — DOXYCYCLINE HYCLATE 100 MG PO CAPS: 100.0000 mg | ORAL_CAPSULE | Freq: Two times a day (BID) | ORAL | 0 refills | Status: DC

## 2018-03-30 MED ORDER — LIDOCAINE-EPINEPHRINE (PF) 2 %-1:200000 IJ SOLN
20.0000 mL | Freq: Once | INTRAMUSCULAR | Status: AC
  Administered 2018-03-30: 20 mL
  Filled 2018-03-30: qty 20

## 2018-03-30 NOTE — ED Provider Notes (Signed)
[facility] EMERGENCY DEPARTMENT Provider Note   CSN: [id number] Arrival date & time: 03/30/18  1043     History   Chief Complaint Chief Complaint  Patient presents with  . rectal abscess    HPI Jared [name] is a 62 y.o. male.  HPI Patient is a 62 year old male presents to the emergency department with perianal pain and suspected perianal abscess per the patient.  This been going on for several days.  Denies drainage.  No fevers or chills.  History of perianal abscess before in the past.  No other complaints.    Past Medical History:  Diagnosis Date  . CAD (coronary artery disease)    a. LHC 5/12:  LAD 20, pLCx 20, pRCA 40, dRCA 40, EF 35%, diff HK  //  b. Myoview 4/16: Overall Impression: High risk stress nuclear study There is no evidence of ischemia. There is severe LV dysfunction. LV Ejection Fraction: 30%. LV Wall Motion: There is global LV hypokinesis.   Marland Kitchen CAP (community acquired pneumonia) 09/2013  . Chronic combined systolic and diastolic CHF (congestive heart failure) (HCC)    a. Echo 4/16:Mild LVH, EF 40-45%, diffuse HK //  b. Echo 8/17: EF 35-40%, diffuse HK, diastolic dysfunction, aortic sclerosis, trivial MR, moderate LAE, normal RVSF, moderate RAE, mild TR, PASP 42 mmHg // c. Echo 4/18: Mild concentric LVH, EF 30-35, normal wall motion, grade 1 diastolic dysfunction, PASP 49  . Cluster headache    "hx; haven't had one in awhile" (01/09/2016)  . COPD (chronic obstructive pulmonary disease) (HCC)    [name] 01/09/2016  . History of CVA (cerebrovascular accident)   . Hypertension   . NICM (nonischemic cardiomyopathy) (HCC)   . Tobacco abuse     Patient Active Problem List   Diagnosis Date Noted  . Noncompliance 06/14/2017  . Perirectal abscess 06/04/2017  . Elevated LFTs 08/13/2016  . Periumbilical abdominal pain 08/13/2016  . Chronic respiratory failure with hypoxia (HCC) 07/24/2016  . Encounter for therapeutic drug monitoring  07/11/2016  . Atrial flutter (HCC)   . Hepatic congestion 07/02/2016  . Chronic combined systolic and diastolic CHF (congestive heart failure) (HCC) 07/01/2016  . COPD exacerbation (HCC) 06/03/2016  . Prediabetes 05/14/2016  . Hemoptysis 05/06/2016  . COPD GOLD III/still smoking  02/29/2016  . Cigarette smoker 01/09/2016  . CAD (coronary artery disease) 10/07/2013  . 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 Centrilobular emphysema (HCC) 10/04/2013  . Pulmonary infiltrate in right lung on CXR 10/03/2013  . Essential hypertension     Past Surgical History:  Procedure Laterality Date  . CARDIAC CATHETERIZATION  10/2010   LM normal, LAD with 20% irregularities, LCX with 20%, RCA with 40% prox and 40% distal - EF of 35%  . COLONOSCOPY W/ BIOPSIES AND POLYPECTOMY    . EXCISION MASS HEAD    . INCISION AND DRAINAGE PERIRECTAL ABSCESS N/A 06/05/2017   Procedure: IRRIGATION AND DEBRIDEMENT PERIRECTAL ABSCESS;  Surgeon: Andria Meuse, MD;  Location: [facility] OR;  Service: General;  Laterality: N/A;  . VIDEO BRONCHOSCOPY Bilateral 05/08/2016   Procedure: VIDEO BRONCHOSCOPY WITH FLUORO;  Surgeon: Oretha Milch, MD;  Location: [facility];  Service: Cardiopulmonary;  Laterality: Bilateral;        Home Medications    Prior to Admission medications   Medication Sig Start Date End Date Taking? Authorizing Provider  OXYGEN Place 2 L into the nose as needed. 2lpm when needed per pt   Yes [provider]  albuterol (PROVENTIL HFA;VENTOLIN  HFA) 108 (90 Base) MCG/ACT inhaler Inhale 2 puffs into the lungs every 6 (six) hours as needed for wheezing or shortness of breath. Patient not taking: Reported on 03/30/2018 11/20/16   [name], MD  bisoprolol (ZEBETA) 5 MG tablet Take 1 tablet (5 mg total) by mouth daily. Patient not taking: Reported on 03/30/2018 07/11/17   Pricilla Riffle, MD  docusate sodium (COLACE) 100 MG capsule Take 1 capsule (100 mg total) by mouth every 12  (twelve) hours. Patient not taking: Reported on 03/30/2018 06/02/17   Roxy Horseman, PA-C  doxycycline (VIBRAMYCIN) 100 MG capsule Take 1 capsule (100 mg total) by mouth 2 (two) times daily. 03/30/18   Azalia Bilis, MD  furosemide (LASIX) 40 MG tablet Take 1 tablet (40 mg total) by mouth daily. Patient not taking: Reported on 03/30/2018 07/11/17   Pricilla Riffle, MD  Glycopyrrolate-Formoterol (BEVESPI AEROSPHERE) 9-4.8 MCG/ACT AERO Inhale 2 puffs into the lungs 2 (two) times daily. Patient not taking: Reported on 03/30/2018 08/23/16   Nyoka Cowden, MD  HYDROcodone-acetaminophen (NORCO/VICODIN) 5-325 MG tablet Take 1-2 tablets by mouth every 6 (six) hours as needed. Patient not taking: Reported on 07/15/2017 06/02/17   Roxy Horseman, PA-C  hyoscyamine (LEVSIN SL) 0.125 MG SL tablet Place 1 tablet (0.125 mg total) under the tongue every 8 (eight) hours as needed. Patient not taking: Reported on 03/30/2018 08/08/16   Zehr, Shanda Bumps D, PA-C  polyethylene glycol powder (GLYCOLAX/MIRALAX) powder Take 17 g by mouth 2 (two) times daily. Until daily soft stools  OTC Patient not taking: Reported on 03/30/2018 06/02/17   Roxy Horseman, PA-C  rosuvastatin (CRESTOR) 10 MG tablet Take 1 tablet (10 mg total) by mouth daily. Patient not taking: Reported on 03/30/2018 08/27/17 11/25/17  Pricilla Riffle, MD  sacubitril-valsartan (ENTRESTO) 49-51 MG Take 1 tablet by mouth 2 (two) times daily. Patient not taking: Reported on 03/30/2018 07/11/17   Pricilla Riffle, MD  spironolactone (ALDACTONE) 25 MG tablet Take 0.5 tablets (12.5 mg total) by mouth daily. Patient not taking: Reported on 03/30/2018 07/11/17   Pricilla Riffle, MD  warfarin (COUMADIN) 7.5 MG tablet Take as directed by coumadin clinic Patient not taking: Reported on 03/30/2018 07/11/17   Pricilla Riffle, MD    Family History Family History  Problem Relation Age of Onset  . Heart disease Mother   . Diabetes Mother   . Colon cancer Mother   . Liver  cancer Mother   . Cancer Father        type unknown  . Diabetes Sister        x 2  . Diabetes Brother     Social History Social History   Tobacco Use  . Smoking status: Current Every Day Smoker    Packs/day: 0.25    Years: 42.00    Pack years: 10.50    Types: Cigarettes  . Smokeless tobacco: Never Used  . Tobacco comment: 3 cigs daily  Substance Use Topics  . Alcohol use: Yes    Alcohol/week: 0.0 standard drinks    Comment: last drink was before xmas  . Drug use: No    Types: Cocaine, Marijuana    Comment: "nothing in 20 years"     Allergies   Lisinopril   Review of Systems Review of Systems  All other systems reviewed and are negative.    Physical Exam Updated Vital Signs BP 138/88   Pulse 63   Temp 98.7 F (37.1 C) (Oral)   Resp (!) 21  SpO2 96%   Physical Exam  Constitutional: He is oriented to person, place, and time. He appears well-developed and well-nourished.  HENT:  Head: Normocephalic.  Eyes: EOM are normal.  Neck: Normal range of motion.  Pulmonary/Chest: Effort normal.  Abdominal: He exhibits no distension.  Genitourinary:  Genitourinary Comments: Right lateral perianal fluctuant mass consistent with perianal abscess.  No signs of surrounding cellulitis  Musculoskeletal: Normal range of motion.  Neurological: He is alert and oriented to person, place, and time.  Psychiatric: He has a normal mood and affect.  Nursing note and vitals reviewed.    ED Treatments / Results  Labs (all labs ordered are listed, but only abnormal results are displayed) Labs Reviewed - No data to display  EKG None  Radiology No results found.  Procedures .Marland KitchenIncision and Drainage Performed by: Azalia Bilis, MD Authorized by: Azalia Bilis, MD     INCISION AND DRAINAGE Performed by: Azalia Bilis Consent: Verbal consent obtained. Risks and benefits: risks, benefits and alternatives were discussed Time out performed prior to procedure Type:  abscess Body area: Perianal Anesthesia: local infiltration Incision was made with a scalpel. Local anesthetic: lidocaine 2% with epinephrine Anesthetic total: 3 ml Complexity: complex Blunt dissection to break up loculations Drainage: purulent Drainage amount: Large Packing material: None Patient tolerance: Patient tolerated the procedure well with no immediate complications.     Medications Ordered in ED Medications  oxyCODONE-acetaminophen (PERCOCET/ROXICET) 5-325 MG per tablet 1 tablet (1 tablet Oral Given 03/30/18 1212)  lidocaine-EPINEPHrine (XYLOCAINE W/EPI) 2 %-1:200000 (PF) injection 20 mL (20 mLs Infiltration Given by Other 03/30/18 1212)     Initial Impression / Assessment and Plan / ED Course  I have reviewed the triage vital signs and the nursing notes.  Pertinent labs & imaging results that were available during my care of the patient were reviewed by me and considered in my medical decision making (see chart for details).     Tolerated the procedure well.  Feels much better this time.  Home with antibiotics and instructions for sitz bath.  Final Clinical Impressions(s) / ED Diagnoses   Final diagnoses:  Perianal abscess    ED Discharge Orders         Ordered    doxycycline (VIBRAMYCIN) 100 MG capsule  2 times daily     03/30/18 1313           Azalia Bilis, MD 03/30/18 1337

## 2018-03-30 NOTE — ED Triage Notes (Signed)
Pt reports hx of rectal abscess, states he thinks he has another one, pain began about 1 week ago

## 2018-05-01 ENCOUNTER — Encounter (HOSPITAL_COMMUNITY): Payer: Self-pay

## 2018-05-01 ENCOUNTER — Inpatient Hospital Stay (HOSPITAL_COMMUNITY)
Admission: EM | Admit: 2018-05-01 | Discharge: 2018-05-02 | DRG: 191 | Disposition: A | Discharge status: Home / Self Care | Payer: Medicaid Other | Attending: Internal Medicine | Admitting: Internal Medicine

## 2018-05-01 ENCOUNTER — Emergency Department (HOSPITAL_COMMUNITY): Payer: Medicaid Other

## 2018-05-01 ENCOUNTER — Other Ambulatory Visit: Payer: Self-pay

## 2018-05-01 DIAGNOSIS — I5042 Chronic combined systolic (congestive) and diastolic (congestive) heart failure: Secondary | ICD-10-CM | POA: Diagnosis present

## 2018-05-01 DIAGNOSIS — Z599 Problem related to housing and economic circumstances, unspecified: Secondary | ICD-10-CM

## 2018-05-01 DIAGNOSIS — I4892 Unspecified atrial flutter: Secondary | ICD-10-CM | POA: Diagnosis present

## 2018-05-01 DIAGNOSIS — Z79899 Other long term (current) drug therapy: Secondary | ICD-10-CM | POA: Diagnosis not present

## 2018-05-01 DIAGNOSIS — Z23 Encounter for immunization: Secondary | ICD-10-CM

## 2018-05-01 DIAGNOSIS — J209 Acute bronchitis, unspecified: Secondary | ICD-10-CM | POA: Diagnosis present

## 2018-05-01 DIAGNOSIS — R0600 Dyspnea, unspecified: Secondary | ICD-10-CM

## 2018-05-01 DIAGNOSIS — Z86010 Personal history of colonic polyps: Secondary | ICD-10-CM

## 2018-05-01 DIAGNOSIS — Z8249 Family history of ischemic heart disease and other diseases of the circulatory system: Secondary | ICD-10-CM

## 2018-05-01 DIAGNOSIS — F17210 Nicotine dependence, cigarettes, uncomplicated: Secondary | ICD-10-CM | POA: Diagnosis present

## 2018-05-01 DIAGNOSIS — J440 Chronic obstructive pulmonary disease with acute lower respiratory infection: Principal | ICD-10-CM | POA: Diagnosis present

## 2018-05-01 DIAGNOSIS — Z888 Allergy status to other drugs, medicaments and biological substances status: Secondary | ICD-10-CM | POA: Diagnosis not present

## 2018-05-01 DIAGNOSIS — I2510 Atherosclerotic heart disease of native coronary artery without angina pectoris: Secondary | ICD-10-CM | POA: Diagnosis present

## 2018-05-01 DIAGNOSIS — Z8673 Personal history of transient ischemic attack (TIA), and cerebral infarction without residual deficits: Secondary | ICD-10-CM

## 2018-05-01 DIAGNOSIS — Z9981 Dependence on supplemental oxygen: Secondary | ICD-10-CM | POA: Diagnosis not present

## 2018-05-01 DIAGNOSIS — Z9119 Patient's noncompliance with other medical treatment and regimen: Secondary | ICD-10-CM | POA: Diagnosis not present

## 2018-05-01 DIAGNOSIS — I5043 Acute on chronic combined systolic (congestive) and diastolic (congestive) heart failure: Secondary | ICD-10-CM | POA: Diagnosis present

## 2018-05-01 DIAGNOSIS — Z7901 Long term (current) use of anticoagulants: Secondary | ICD-10-CM

## 2018-05-01 DIAGNOSIS — R0602 Shortness of breath: Secondary | ICD-10-CM | POA: Insufficient documentation

## 2018-05-01 DIAGNOSIS — J441 Chronic obstructive pulmonary disease with (acute) exacerbation: Secondary | ICD-10-CM | POA: Diagnosis present

## 2018-05-01 DIAGNOSIS — I110 Hypertensive heart disease with heart failure: Secondary | ICD-10-CM | POA: Diagnosis present

## 2018-05-01 LAB — I-STAT TROPONIN, ED: Troponin i, poc: 0.01 ng/mL (ref 0.00–0.08)

## 2018-05-01 LAB — BASIC METABOLIC PANEL
Anion gap: 6 (ref 5–15)
BUN: 9 mg/dL (ref 8–23)
CO2: 30 mmol/L (ref 22–32)
Calcium: 9 mg/dL (ref 8.9–10.3)
Chloride: 106 mmol/L (ref 98–111)
Creatinine, Ser: 1.1 mg/dL (ref 0.61–1.24)
GFR calc Af Amer: >60 mL/min (ref >60)
GFR calc non Af Amer: >60 mL/min (ref >60)
Glucose, Bld: 107 mg/dL — ABNORMAL HIGH (ref 70–99)
Potassium: 4.2 mmol/L (ref 3.5–5.1)
Sodium: 142 mmol/L (ref 135–145)

## 2018-05-01 LAB — CBC WITH DIFFERENTIAL/PLATELET
Abs Immature Granulocytes: 0.01 10*3/uL (ref 0.00–0.07)
Basophils Absolute: 0 10*3/uL (ref 0.0–0.1)
Basophils Relative: 1 %
Eosinophils Absolute: 0.2 10*3/uL (ref 0.0–0.5)
Eosinophils Relative: 4 %
HCT: 50.1 % (ref 39.0–52.0)
Hemoglobin: 15.4 g/dL (ref 13.0–17.0)
Immature Granulocytes: 0 %
Lymphocytes Relative: 35 %
Lymphs Abs: 2.4 10*3/uL (ref 0.7–4.0)
MCH: 28.5 pg (ref 26.0–34.0)
MCHC: 30.7 g/dL (ref 30.0–36.0)
MCV: 92.6 fL (ref 80.0–100.0)
Monocytes Absolute: 0.5 10*3/uL (ref 0.1–1.0)
Monocytes Relative: 8 %
Neutro Abs: 3.5 10*3/uL (ref 1.7–7.7)
Neutrophils Relative %: 52 %
Platelets: 138 10*3/uL — ABNORMAL LOW (ref 150–400)
RBC: 5.41 MIL/uL (ref 4.22–5.81)
RDW: 13.8 % (ref 11.5–15.5)
WBC: 6.7 10*3/uL (ref 4.0–10.5)
nRBC: 0 % (ref 0.0–0.2)

## 2018-05-01 LAB — BRAIN NATRIURETIC PEPTIDE: B Natriuretic Peptide: 310.3 pg/mL — ABNORMAL HIGH (ref 0.0–100.0)

## 2018-05-01 LAB — TROPONIN I: Troponin I: <0.03 ng/mL (ref <0.03)

## 2018-05-01 MED ORDER — INFLUENZA VAC SPLIT QUAD 0.5 ML IM SUSY
0.5000 mL | PREFILLED_SYRINGE | INTRAMUSCULAR | Status: AC | PRN
  Administered 2018-05-02: 0.5 mL via INTRAMUSCULAR
  Filled 2018-05-01: qty 0.5

## 2018-05-01 MED ORDER — PNEUMOCOCCAL VAC POLYVALENT 25 MCG/0.5ML IJ INJ
0.5000 mL | INJECTION | INTRAMUSCULAR | Status: AC | PRN
  Administered 2018-05-02: 0.5 mL via INTRAMUSCULAR
  Filled 2018-05-01: qty 0.5

## 2018-05-01 MED ORDER — ACETAMINOPHEN 325 MG PO TABS
650.0000 mg | ORAL_TABLET | Freq: Four times a day (QID) | ORAL | Status: DC | PRN
  Administered 2018-05-01 – 2018-05-02 (×2): 650 mg via ORAL
  Filled 2018-05-01 (×2): qty 2

## 2018-05-01 MED ORDER — RIVAROXABAN 20 MG PO TABS
20.0000 mg | ORAL_TABLET | Freq: Every day | ORAL | Status: DC
  Administered 2018-05-01: 20 mg via ORAL
  Filled 2018-05-01 (×2): qty 1

## 2018-05-01 MED ORDER — ALBUTEROL SULFATE (2.5 MG/3ML) 0.083% IN NEBU
5.0000 mg | INHALATION_SOLUTION | Freq: Once | RESPIRATORY_TRACT | Status: AC
  Administered 2018-05-01: 5 mg via RESPIRATORY_TRACT
  Filled 2018-05-01: qty 6

## 2018-05-01 MED ORDER — ACETAMINOPHEN 650 MG RE SUPP: 650.0000 mg | Freq: Four times a day (QID) | RECTAL | Status: DC | PRN

## 2018-05-01 MED ORDER — ALBUTEROL (5 MG/ML) CONTINUOUS INHALATION SOLN
10.0000 mg/h | INHALATION_SOLUTION | Freq: Once | RESPIRATORY_TRACT | Status: AC
  Administered 2018-05-01: 10 mg/h via RESPIRATORY_TRACT
  Filled 2018-05-01: qty 20

## 2018-05-01 MED ORDER — IPRATROPIUM-ALBUTEROL 0.5-2.5 (3) MG/3ML IN SOLN
3.0000 mL | Freq: Four times a day (QID) | RESPIRATORY_TRACT | Status: DC
  Administered 2018-05-01 – 2018-05-02 (×4): 3 mL via RESPIRATORY_TRACT
  Filled 2018-05-01 (×4): qty 3

## 2018-05-01 MED ORDER — LOSARTAN POTASSIUM 25 MG PO TABS
25.0000 mg | ORAL_TABLET | Freq: Every day | ORAL | Status: DC
  Administered 2018-05-02: 25 mg via ORAL
  Filled 2018-05-01: qty 1

## 2018-05-01 MED ORDER — NICOTINE 14 MG/24HR TD PT24
14.0000 mg | MEDICATED_PATCH | Freq: Every day | TRANSDERMAL | Status: DC
  Administered 2018-05-01 – 2018-05-02 (×2): 14 mg via TRANSDERMAL
  Filled 2018-05-01 (×2): qty 1

## 2018-05-01 MED ORDER — AZITHROMYCIN 250 MG PO TABS
500.0000 mg | ORAL_TABLET | Freq: Once | ORAL | Status: AC
  Administered 2018-05-01: 500 mg via ORAL
  Filled 2018-05-01: qty 2

## 2018-05-01 MED ORDER — CARVEDILOL 3.125 MG PO TABS
3.1250 mg | ORAL_TABLET | Freq: Two times a day (BID) | ORAL | Status: DC
  Administered 2018-05-01 – 2018-05-02 (×2): 3.125 mg via ORAL
  Filled 2018-05-01 (×4): qty 1

## 2018-05-01 MED ORDER — PREDNISONE 20 MG PO TABS
40.0000 mg | ORAL_TABLET | Freq: Every day | ORAL | Status: DC
  Administered 2018-05-01 – 2018-05-02 (×2): 40 mg via ORAL
  Filled 2018-05-01 (×2): qty 2

## 2018-05-01 MED ORDER — SODIUM CHLORIDE 0.9% FLUSH
3.0000 mL | Freq: Two times a day (BID) | INTRAVENOUS | Status: DC
  Administered 2018-05-01: 3 mL via INTRAVENOUS

## 2018-05-01 MED ORDER — FUROSEMIDE 20 MG PO TABS: 40.0000 mg | ORAL_TABLET | Freq: Every day | ORAL | Status: DC

## 2018-05-01 MED ORDER — AZITHROMYCIN 250 MG PO TABS
250.0000 mg | ORAL_TABLET | Freq: Every day | ORAL | Status: DC
  Administered 2018-05-02: 250 mg via ORAL
  Filled 2018-05-01: qty 1

## 2018-05-01 NOTE — ED Provider Notes (Signed)
[facility] EMERGENCY DEPARTMENT Provider Note   CSN: [id number] Arrival date & time: 05/01/18  1121     History   Chief Complaint Chief Complaint  Patient presents with  . Chest Pain  . Shortness of Breath    HPI Jared [name] is a 62 y.o. male.  HPI complains of shortness of breath accompanied by nonproductive cough onset 10:30 PM yesterday.  He states that shortness of breath feels like COPD has had in the past.  He denies any fever.  No treatment prior to coming here dyspnea is worse with lying supine and improved with sitting upright.  He is taken no medicines for several weeks because "I cannot afford them" denies fever denies chest pain did not.  Denies other associated symptoms. Past Medical History:  Diagnosis Date  . CAD (coronary artery disease)    a. LHC 5/12:  LAD 20, pLCx 20, pRCA 40, dRCA 40, EF 35%, diff HK  //  b. Myoview 4/16: Overall Impression: High risk stress nuclear study There is no evidence of ischemia. There is severe LV dysfunction. LV Ejection Fraction: 30%. LV Wall Motion: There is global LV hypokinesis.   Marland Kitchen CAP (community acquired pneumonia) 09/2013  . Chronic combined systolic and diastolic CHF (congestive heart failure) (HCC)    a. Echo 4/16:Mild LVH, EF 40-45%, diffuse HK //  b. Echo 8/17: EF 35-40%, diffuse HK, diastolic dysfunction, aortic sclerosis, trivial MR, moderate LAE, normal RVSF, moderate RAE, mild TR, PASP 42 mmHg // c. Echo 4/18: Mild concentric LVH, EF 30-35, normal wall motion, grade 1 diastolic dysfunction, PASP 49  . Cluster headache    "hx; haven't had one in awhile" (01/09/2016)  . COPD (chronic obstructive pulmonary disease) (HCC)    [name] 01/09/2016  . History of CVA (cerebrovascular accident)   . Hypertension   . NICM (nonischemic cardiomyopathy) (HCC)   . Tobacco abuse     Patient Active Problem List   Diagnosis Date Noted  . Noncompliance 06/14/2017  . Perirectal abscess 06/04/2017  . Elevated  LFTs 08/13/2016  . Periumbilical abdominal pain 08/13/2016  . Chronic respiratory failure with hypoxia (HCC) 07/24/2016  . Encounter for therapeutic drug monitoring 07/11/2016  . Atrial flutter (HCC)   . Hepatic congestion 07/02/2016  . Chronic combined systolic and diastolic CHF (congestive heart failure) (HCC) 07/01/2016  . COPD exacerbation (HCC) 06/03/2016  . Prediabetes 05/14/2016  . Hemoptysis 05/06/2016  . COPD GOLD III/still smoking  02/29/2016  . Cigarette smoker 01/09/2016  . CAD (coronary artery disease) 10/07/2013  . 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 Centrilobular emphysema (HCC) 10/04/2013  . Pulmonary infiltrate in right lung on CXR 10/03/2013  . Essential hypertension     Past Surgical History:  Procedure Laterality Date  . CARDIAC CATHETERIZATION  10/2010   LM normal, LAD with 20% irregularities, LCX with 20%, RCA with 40% prox and 40% distal - EF of 35%  . COLONOSCOPY W/ BIOPSIES AND POLYPECTOMY    . EXCISION MASS HEAD    . INCISION AND DRAINAGE PERIRECTAL ABSCESS N/A 06/05/2017   Procedure: IRRIGATION AND DEBRIDEMENT PERIRECTAL ABSCESS;  Surgeon: Andria Meuse, MD;  Location: [facility] OR;  Service: General;  Laterality: N/A;  . VIDEO BRONCHOSCOPY Bilateral 05/08/2016   Procedure: VIDEO BRONCHOSCOPY WITH FLUORO;  Surgeon: Oretha Milch, MD;  Location: [facility] ENDOSCOPY;  Service: Cardiopulmonary;  Laterality: Bilateral;        Home Medications    Prior to Admission medications   Medication Sig Start Date  End Date Taking? Authorizing Provider  albuterol (PROVENTIL HFA;VENTOLIN HFA) 108 (90 Base) MCG/ACT inhaler Inhale 2 puffs into the lungs every 6 (six) hours as needed for wheezing or shortness of breath. Patient not taking: Reported on 03/30/2018 11/20/16   [name], MD  bisoprolol (ZEBETA) 5 MG tablet Take 1 tablet (5 mg total) by mouth daily. Patient not taking: Reported on 03/30/2018 07/11/17   Pricilla Riffle, MD  docusate sodium  (COLACE) 100 MG capsule Take 1 capsule (100 mg total) by mouth every 12 (twelve) hours. Patient not taking: Reported on 03/30/2018 06/02/17   Roxy Horseman, PA-C  doxycycline (VIBRAMYCIN) 100 MG capsule Take 1 capsule (100 mg total) by mouth 2 (two) times daily. 03/30/18   Azalia Bilis, MD  furosemide (LASIX) 40 MG tablet Take 1 tablet (40 mg total) by mouth daily. Patient not taking: Reported on 03/30/2018 07/11/17   Pricilla Riffle, MD  Glycopyrrolate-Formoterol (BEVESPI AEROSPHERE) 9-4.8 MCG/ACT AERO Inhale 2 puffs into the lungs 2 (two) times daily. Patient not taking: Reported on 03/30/2018 08/23/16   Nyoka Cowden, MD  HYDROcodone-acetaminophen (NORCO/VICODIN) 5-325 MG tablet Take 1-2 tablets by mouth every 6 (six) hours as needed. Patient not taking: Reported on 07/15/2017 06/02/17   Roxy Horseman, PA-C  hyoscyamine (LEVSIN SL) 0.125 MG SL tablet Place 1 tablet (0.125 mg total) under the tongue every 8 (eight) hours as needed. Patient not taking: Reported on 03/30/2018 08/08/16   Zehr, Princella Pellegrini, PA-C  OXYGEN Place 2 L into the nose as needed. 2lpm when needed per pt    [provider]  polyethylene glycol powder (GLYCOLAX/MIRALAX) powder Take 17 g by mouth 2 (two) times daily. Until daily soft stools  OTC Patient not taking: Reported on 03/30/2018 06/02/17   Roxy Horseman, PA-C  rosuvastatin (CRESTOR) 10 MG tablet Take 1 tablet (10 mg total) by mouth daily. Patient not taking: Reported on 03/30/2018 08/27/17 11/25/17  Pricilla Riffle, MD  sacubitril-valsartan (ENTRESTO) 49-51 MG Take 1 tablet by mouth 2 (two) times daily. Patient not taking: Reported on 03/30/2018 07/11/17   Pricilla Riffle, MD  spironolactone (ALDACTONE) 25 MG tablet Take 0.5 tablets (12.5 mg total) by mouth daily. Patient not taking: Reported on 03/30/2018 07/11/17   Pricilla Riffle, MD  warfarin (COUMADIN) 7.5 MG tablet Take as directed by coumadin clinic Patient not taking: Reported on 03/30/2018 07/11/17    Pricilla Riffle, MD  Medications none.  He has taken no medications for the past 3 months  Family History Family History  Problem Relation Age of Onset  . Heart disease Mother   . Diabetes Mother   . Colon cancer Mother   . Liver cancer Mother   . Cancer Father        type unknown  . Diabetes Sister        x 2  . Diabetes Brother     Social History Social History   Tobacco Use  . Smoking status: Current Every Day Smoker    Packs/day: 0.25    Years: 42.00    Pack years: 10.50    Types: Cigarettes  . Smokeless tobacco: Never Used  . Tobacco comment: 3 cigs daily  Substance Use Topics  . Alcohol use: Yes    Alcohol/week: 0.0 standard drinks    Comment: last drink was before xmas  . Drug use: No    Types: Cocaine, Marijuana    Comment: "nothing in 20 years"     Allergies   Lisinopril  Review of Systems Review of Systems  Constitutional: Negative.   HENT: Negative.   Respiratory: Positive for cough and shortness of breath.   Cardiovascular: Positive for chest pain.       Syncope  Gastrointestinal: Negative.   Musculoskeletal: Negative.   Skin: Negative.   Allergic/Immunologic: Negative.   Neurological: Negative.   Psychiatric/Behavioral: Negative.   All other systems reviewed and are negative.    Physical Exam Updated Vital Signs BP (!) 158/111 (BP Location: Right Arm)   Pulse (!) 107   Temp 98.1 F (36.7 C) (Oral)   Resp 20   Ht 6\' 3"  (1.905 m)   Wt 88.5 kg   SpO2 96%   BMI 24.37 kg/m   Physical Exam  Constitutional: He appears well-developed and well-nourished. No distress.  HENT:  Head: Normocephalic and atraumatic.  Eyes: Pupils are equal, round, and reactive to light. Conjunctivae are normal.  Neck: Neck supple. JVD present. No tracheal deviation present. No thyromegaly present.  Mild JVD  Cardiovascular: Regular rhythm and intact distal pulses.  No murmur heard. Mildly tachycardic  Pulmonary/Chest: Effort normal. No respiratory  distress. He has wheezes. He has rales.  Expiratory wheezes.  Rales at bases bilaterally no respiratory distress  Abdominal: Soft. Bowel sounds are normal. He exhibits no distension. There is no tenderness.  Musculoskeletal: Normal range of motion. He exhibits no edema or tenderness.  Neurological: He is alert. Coordination normal.  Skin: Skin is warm and dry. No rash noted.  Psychiatric: He has a normal mood and affect.  Nursing note and vitals reviewed.    ED Treatments / Results  Labs (all labs ordered are listed, but only abnormal results are displayed) Labs Reviewed  BASIC METABOLIC PANEL  CBC WITH DIFFERENTIAL/PLATELET  BRAIN NATRIURETIC PEPTIDE  I-STAT TROPONIN, ED    EKG EKG Interpretation  Date/Time:  Thursday May 01 2018 11:31:44 EST Ventricular Rate:  101 PR Interval:    QRS Duration: 94 QT Interval:  359 QTC Calculation: 466 R Axis:   -83 Text Interpretation:  Sinus tachycardia Ventricular premature complex Inferior infarct, old SINCE LAST TRACING HEART RATE HAS INCREASED Confirmed by Doug Sou [phone]) on 05/01/2018 11:46:41 AM  Chest x-ray viewed by me Results for orders placed or performed during the hospital encounter of 05/01/18  Basic metabolic panel  Result Value Ref Range   Sodium 142 135 - 145 mmol/L   Potassium 4.2 3.5 - 5.1 mmol/L   Chloride 106 98 - 111 mmol/L   CO2 30 22 - 32 mmol/L   Glucose, Bld 107 (H) 70 - 99 mg/dL   BUN 9 8 - 23 mg/dL   Creatinine, Ser 6.04 0.61 - 1.24 mg/dL   Calcium 9.0 8.9 - 54.0 mg/dL   GFR calc non Af Amer >60 >60 mL/min   GFR calc Af Amer >60 >60 mL/min   Anion gap 6 5 - 15  CBC with Differential/Platelet  Result Value Ref Range   WBC 6.7 4.0 - 10.5 K/uL   RBC 5.41 4.22 - 5.81 MIL/uL   Hemoglobin 15.4 13.0 - 17.0 g/dL   HCT 98.1 19.1 - 47.8 %   MCV 92.6 80.0 - 100.0 fL   MCH 28.5 26.0 - 34.0 pg   MCHC 30.7 30.0 - 36.0 g/dL   RDW 29.5 62.1 - 30.8 %   Platelets 138 (L) 150 - 400 K/uL   nRBC 0.0 0.0  - 0.2 %   Neutrophils Relative % 52 %   Neutro Abs 3.5 1.7 - 7.7  K/uL   Lymphocytes Relative 35 %   Lymphs Abs 2.4 0.7 - 4.0 K/uL   Monocytes Relative 8 %   Monocytes Absolute 0.5 0.1 - 1.0 K/uL   Eosinophils Relative 4 %   Eosinophils Absolute 0.2 0.0 - 0.5 K/uL   Basophils Relative 1 %   Basophils Absolute 0.0 0.0 - 0.1 K/uL   Immature Granulocytes 0 %   Abs Immature Granulocytes 0.01 0.00 - 0.07 K/uL  Brain natriuretic peptide  Result Value Ref Range   B Natriuretic Peptide 310.3 (H) 0.0 - 100.0 pg/mL  I-stat troponin, ED  Result Value Ref Range   Troponin i, poc 0.01 0.00 - 0.08 ng/mL   Comment 3           Dg Chest 2 View  Result Date: 05/01/2018 CLINICAL DATA:  Left-sided chest pain and shortness of breath beginning last night. Productive cough over the last 2 weeks. EXAM: CHEST - 2 VIEW COMPARISON:  11/21/2016 FINDINGS: Artifact overlies the chest. Heart size is normal. Aortic atherosclerosis. There is mild central bronchial thickening but no evidence of infiltrate, collapse or effusion. No acute bone finding. Old healed rib fractures on the left. IMPRESSION: Bronchitis pattern.  No consolidation or collapse. Electronically Signed   By: Paulina Fusi M.D.   On: 05/01/2018 13:05   Radiology No results found.  Procedures Procedures (including critical care time)  Medications Ordered in ED Medications  albuterol (PROVENTIL) (2.5 MG/3ML) 0.083% nebulizer solution 5 mg (has no administration in time range)    Chest x-ray viewed by me i Initial Impression / Assessment and Plan / ED Course  I have reviewed the triage vital signs and the nursing notes.  Pertinent labs & imaging results that were available during my care of the patient were reviewed by me and considered in my medical decision making (see chart for details).   3:45 PM patient states breathing somewhat better after treatment with continuous butyryl nebulization 1 hour addition to a single albuterol 5 mg nebulized  treatment  She likely having exacerbation of COPD with acute bronchitis he also suffers from mild congestive heart failure which is likely playing a role given neck vein distention, medication noncompliance with medication.  I counseled patient for 5 minutes on smoking cessation.  I have consulted internal medicine resident physician to arrange for overnight stay.  I feel the patient is to have echocardiogram as he has history of atrial flutter.  Has been noncompliant with Coumadin and all medications for several months  Work work remarkable for mildly elevated BNP consistent with mild congestive heart failure, BNP is chronically elevated. Final Clinical Impressions(s) / ED Diagnoses  #1 acute dyspnea #2 tobacco abuse #3 COPD exacerbation #4 congestive heart failure, chronic #5 medication noncompliance  CRITICAL CARE Performed by: Doug Sou Total critical care time: 40 minutes Critical care time was exclusive of separately billable procedures and treating other patients. Critical care was necessary to treat or prevent imminent or life-threatening deterioration. Critical care was time spent personally by me on the following activities: development of treatment plan with patient and/or surrogate as well as nursing, discussions with consultants, evaluation of patient's response to treatment, examination of patient, obtaining history from patient or surrogate, ordering and performing treatments and interventions, ordering and review of laboratory studies, ordering and review of radiographic studies, pulse oximetry and re-evaluation of patient's condition. Final diagnoses:  None    ED Discharge Orders    None       Doug Sou, MD 05/01/18  1601  

## 2018-05-01 NOTE — H&P (Signed)
Date: 05/01/2018               Patient Name:  Jared [name] MRN: [id number]  DOB: 11-27-1955 Age / Sex: 62 y.o., male   PCP: [name], MD         Medical Service: Internal Medicine Teaching Service         Attending Physician: Dr. Doneen Poisson, MD    First Contact: Dr. Cleaster Corin Pager: [phone]  Second Contact: Dr. Evelene Croon Pager: [phone]       After Hours (After 5p/  First Contact Pager: [phone]  weekends / holidays): Second Contact Pager: [phone]   Chief Complaint: Dyspnea  History of Present Illness:  Mr. Rhoton is a 62yo male with PMH tobacco use, CAD with cath 2012, HTN, COPD, combined CHF (last EF 30%) and cardiomyopathy, and CAD presenting today with increased SOB from baseline and chest pain that began suddenly yesterday evening. He states he has had a productive cough for about one month with clear sputum that came on with the change in weather. He has not been taking his medications for COPD, HTN, and HF for the past three weeks as he is unable to afford them. With his SOB he also had sudden onset chest pain localized slightly to the left side of his chest that feels like an ongoing dull ache. He states the pain does not seem to change or increase with exertion, eating or different times of the day and does not radiate anywhere. He took a medication in the past for chest pain but cannot remember what this was. He denies upper back pain. He denies left arm numbness or tingling, jaw pain, nausea, abdominal pain, changes in urination. He did have some diarrhea yesterday that has resolved today.  He denies fever, recent sick contacts, and other recent illness.    Meds:  No outpatient medications have been marked as taking for the 05/01/18 encounter [facility] Encounter).     Allergies: Allergies as of 05/01/2018 - Review Complete 05/01/2018  Allergen Reaction Noted  . Lisinopril Cough 06/29/2014   Past Medical History:  Diagnosis Date  . CAD (coronary artery  disease)    a. LHC 5/12:  LAD 20, pLCx 20, pRCA 40, dRCA 40, EF 35%, diff HK  //  b. Myoview 4/16: Overall Impression: High risk stress nuclear study There is no evidence of ischemia. There is severe LV dysfunction. LV Ejection Fraction: 30%. LV Wall Motion: There is global LV hypokinesis.   Marland Kitchen CAP (community acquired pneumonia) 09/2013  . Chronic combined systolic and diastolic CHF (congestive heart failure) (HCC)    a. Echo 4/16:Mild LVH, EF 40-45%, diffuse HK //  b. Echo 8/17: EF 35-40%, diffuse HK, diastolic dysfunction, aortic sclerosis, trivial MR, moderate LAE, normal RVSF, moderate RAE, mild TR, PASP 42 mmHg // c. Echo 4/18: Mild concentric LVH, EF 30-35, normal wall motion, grade 1 diastolic dysfunction, PASP 49  . Cluster headache    "hx; haven't had one in awhile" (01/09/2016)  . COPD (chronic obstructive pulmonary disease) (HCC)    [name] 01/09/2016  . History of CVA (cerebrovascular accident)   . Hypertension   . NICM (nonischemic cardiomyopathy) (HCC)   . Tobacco abuse     Family History:  He does not know his family history.   Social History:  He cannot remember the last time he went to see his PCP. He smokes 1/2 ppd and has for the past 40 years at least. He drinks ocassionally and lives in  a boarding house in [place]. He has been unable to work and is trying to obtain disability.   Review of Systems: A complete ROS was negative except as per HPI.   Physical Exam: Blood pressure 116/70, pulse (!) 53, temperature 98.1 F (36.7 C), temperature source Oral, resp. rate (!) 23, height 6\' 3"  (1.905 m), weight 88.5 kg, SpO2 91 %.  Constitution: NAD, lying supine in bed HEENT: eom intact, no scleral icterus Cardio: tachycardic, regular rhythm, distant heart sounds, no murmurs rubs or gallops Respiratory: right sided wheezing, otherwise CTAB Abdominal: +BS, soft, mildly distended, NTTP MSK: moving all extremities, no edema Neuro: a&o, cooperative, normal affect Skin:  c/d/i   BNP: 310  EKG: personally reviewed my interpretation is tachycardia, PACs, Q waves septal leads  CXR: personally reviewed my interpretation is hyperinflated lungs  Assessment & Plan by Problem: Active Problems:   Shortness of breath  62yo male with PMH tobacco use, CAD with cath 2012, HTN, COPD, combined CHF (last EF 30%) and cardiomyopathy, and CAD who presented with increased SOB from baseline and chest pain that began suddenly yesterday evening.  Acute on Chronic COPD Exacerbation Baseline SOB acutely worsened last night with one month productive cough of clear sputum. His symptoms are likely secondary to holding his medications and exacerbation of his COPD. He continues to smoke 1/2 ppd. With acute onset of chest pain and SOB and multiple high risk factors, work up for alternate etiology of his symptoms is warranted. He will benefit from outpatient follow-up with heart failure as well if this can be arranged as he is quite limited by finances at this time. Acute exacerbation of systolic CHF unlikely as he has no signs of volume overload, exam only significant for JVD.   - start prednisone 40 mg  - azithromycin 5 days - Duo-neb q6h prn - am CMP - echo pending - troponin x 3 - influenza vaccination, pneumovax 23 - admit to telemetry - Case management consulted to assist with disability he has been applying for.   Chronic Systolic Heart Failure Last ECHO done 09/2016 showing EF 30-35%. He previously was on Zebeta 5mg , lasix 40mg  bid, entresto 49-51 mg bid, and spironolactone 12.5 mg qd but has not taken these medications in three weeks due to being unable to afford them. On exam he does not appear hypervolemic but does have mild JVD.  - start coreg 3.125 mg bid - start losartan 25 mg qd - strict I/O's, daily weights   Atrial Flutter Previously on warfarin. Will start xarelto and plan to arrange this in the outpatient setting.   - xeralto - coreg  Diet: heart healthy  diet  VTE: xarelto  IVF: none Code: full   Dispo: Admit patient to Inpatient with expected length of stay greater than 2 midnights.   SignedVersie Starks, DO 05/01/2018, 4:45 PM  Pager: [phone]

## 2018-05-01 NOTE — ED Triage Notes (Signed)
Pt BIB POV for eval of CP and SOB onset last evening. Pt reports hx of CHF and COPD. Pt reports chest pressure 9/10 which has gradually worsened in severity since last evening. Endorses ongoing SOB for which he tried his home O2 w/ no relief.

## 2018-05-01 NOTE — H&P (Signed)
Medicine attending admission note: I personally interviewed and examined this patient and reviewed all pertinent clinical, laboratory, x-ray, and EKG data, and I attest to the accuracy of the evaluation and management plan as recorded in the history and physical by resident physician Dr Guinevere Scarlet.  62 year old man with hypertension, oxygen dependent obstructive airway disease, coronary artery disease with subcritical multivessel stenoses, nonischemic? cardiomyopathy with echocardiogram done April 2018 showing left ventricular ejection fraction estimated at 30-35%, normal wall motion, and grade 1 diastolic dysfunction.  Elevated pulmonary artery pressure 49 mm.  Right ventricular function normal.  He denies prior MI but current EKG consistent with previous inferior wall injury.  He had an admission in January 2018 for new onset atrial flutter and acute on chronic systolic and diastolic heart failure.  He was started on warfarin anticoagulation at that time.  Oxygen saturations in the 84-86% range on ambulation and oxygen started at that time.  He continues to smoke about 10 cigarettes daily. He has not been taking any of his medications for the last 3 months. He presents with an approximate 1 week history of progressive dry hacking cough, he has not felt feverish but did not take his temperature.  Symptoms acutely worsened last night with increasing cough and dyspnea and retrosternal chest tightness.  Orthopnea.  He presented to the emergency department today with these complaints.  He is currently living in a boardinghouse.  He has been exposed to other residents who have been coughing.  Initial vital signs with blood pressure 158/111, pulse 101 regular, respirations 19, initial oxygen saturation 96% on room air but fell to 89%.  Temperature 98.1. Cardiogram shows sinus tachycardia.  Q waves in leads II 3 and F.  (He denies prior MI).  Occasional PVCs. A chest radiograph shows a normal sized heart.  No  infiltrate or effusion.  Old healed rib fractures on the left.  Bronchitis pattern with mild central bronchial thickening.  Current exam: Blood pressure (!) 150/102, pulse (!) 105, temperature 98.4 F (36.9 C), temperature source Oral, resp. rate (!) 22, height 6\' 2"  (1.88 m), weight 199 lb 14.4 oz (90.7 kg), SpO2 94 %. Well-nourished African-American man in no acute distress. Diffuse decreased breath sounds over the lung fields.  Resonant to percussion throughout.  Mild diffuse bilateral expiratory wheezing. Regular cardiac rhythm.  No murmur or gallop.  No JVD.  No peripheral edema.  No calf tenderness.  Pertinent lab: White count 6700 with 52% neutrophils, 35 lymphocytes BNP 310 compared with 549 in February 2018 and 729 at time of admission in January 2018. BUN 9, creatinine 1.1, serum bicarbonate 30  Impression: 1.  Acute bronchitis Likely viral however, I am inclined to give him a short course of azithromycin to cover opportunists like mycoplasma in view of his underlying obstructive airway disease.   2.  Acute exacerbation of chronic obstructive airway disease with bronchospastic component. Resume outpatient bronchodilators.  Oxygen.  Short course of steroids.  3.  Cardiomyopathy. No clinical signs of acute failure at this time.  4.  History of atrial flutter. Resume anticoagulation.  He has been noncompliant with follow-up for warfarin.  We may be able to get 1 of the oral Xa inhibitors for at least a year.  4.  Noncompliance with medications for financial reasons. Care management consultation.

## 2018-05-01 NOTE — Discharge Planning (Signed)
[facility] consulted regarding medication assistance.  Pt is active with [employer] and [employer].  EDCM contacted Robyne Peers, CM at [facility] to obtain appointment and if pt is enrolled in any discount programs (blue card/orange card).    Plan is to admit pt for further work-up.  EDCM informed Erskine Squibb of patient possible admission.

## 2018-05-02 ENCOUNTER — Inpatient Hospital Stay (HOSPITAL_COMMUNITY): Payer: Medicaid Other

## 2018-05-02 DIAGNOSIS — I5042 Chronic combined systolic (congestive) and diastolic (congestive) heart failure: Secondary | ICD-10-CM

## 2018-05-02 DIAGNOSIS — Z9861 Coronary angioplasty status: Secondary | ICD-10-CM

## 2018-05-02 DIAGNOSIS — I4892 Unspecified atrial flutter: Secondary | ICD-10-CM

## 2018-05-02 DIAGNOSIS — Z7901 Long term (current) use of anticoagulants: Secondary | ICD-10-CM

## 2018-05-02 DIAGNOSIS — Z79899 Other long term (current) drug therapy: Secondary | ICD-10-CM

## 2018-05-02 DIAGNOSIS — I429 Cardiomyopathy, unspecified: Secondary | ICD-10-CM

## 2018-05-02 DIAGNOSIS — Z9981 Dependence on supplemental oxygen: Secondary | ICD-10-CM

## 2018-05-02 DIAGNOSIS — Z888 Allergy status to other drugs, medicaments and biological substances status: Secondary | ICD-10-CM

## 2018-05-02 DIAGNOSIS — Z720 Tobacco use: Secondary | ICD-10-CM

## 2018-05-02 DIAGNOSIS — Z91120 Patient's intentional underdosing of medication regimen due to financial hardship: Secondary | ICD-10-CM

## 2018-05-02 DIAGNOSIS — I110 Hypertensive heart disease with heart failure: Secondary | ICD-10-CM

## 2018-05-02 DIAGNOSIS — I2510 Atherosclerotic heart disease of native coronary artery without angina pectoris: Secondary | ICD-10-CM

## 2018-05-02 DIAGNOSIS — J441 Chronic obstructive pulmonary disease with (acute) exacerbation: Secondary | ICD-10-CM

## 2018-05-02 LAB — GLUCOSE, CAPILLARY: Glucose-Capillary: 114 mg/dL — ABNORMAL HIGH (ref 70–99)

## 2018-05-02 LAB — COMPREHENSIVE METABOLIC PANEL
ALT: 16 U/L (ref 0–44)
AST: 22 U/L (ref 15–41)
Albumin: 3.6 g/dL (ref 3.5–5.0)
Alkaline Phosphatase: 57 U/L (ref 38–126)
Anion gap: 11 (ref 5–15)
BUN: 12 mg/dL (ref 8–23)
CO2: 25 mmol/L (ref 22–32)
Calcium: 9 mg/dL (ref 8.9–10.3)
Chloride: 104 mmol/L (ref 98–111)
Creatinine, Ser: 0.97 mg/dL (ref 0.61–1.24)
GFR calc Af Amer: >60 mL/min (ref >60)
GFR calc non Af Amer: >60 mL/min (ref >60)
Glucose, Bld: 211 mg/dL — ABNORMAL HIGH (ref 70–99)
Potassium: 4.5 mmol/L (ref 3.5–5.1)
Sodium: 140 mmol/L (ref 135–145)
Total Bilirubin: 0.4 mg/dL (ref 0.3–1.2)
Total Protein: 6.4 g/dL — ABNORMAL LOW (ref 6.5–8.1)

## 2018-05-02 LAB — ECHOCARDIOGRAM COMPLETE
Height: 74 in
Weight: 3198.4 oz

## 2018-05-02 LAB — TROPONIN I
Troponin I: <0.03 ng/mL (ref <0.03)
Troponin I: <0.03 ng/mL (ref <0.03)

## 2018-05-02 MED ORDER — LOSARTAN POTASSIUM 25 MG PO TABS: 25.0000 mg | ORAL_TABLET | Freq: Every day | ORAL | 0 refills | Status: DC

## 2018-05-02 MED ORDER — RIVAROXABAN 20 MG PO TABS: 20.0000 mg | ORAL_TABLET | Freq: Every day | ORAL | 0 refills | Status: DC

## 2018-05-02 MED ORDER — PREDNISONE 20 MG PO TABS: 40.0000 mg | ORAL_TABLET | Freq: Every day | ORAL | 0 refills | Status: DC

## 2018-05-02 MED ORDER — FUROSEMIDE 40 MG PO TABS: 40.0000 mg | ORAL_TABLET | Freq: Every day | ORAL | 0 refills | Status: DC

## 2018-05-02 MED ORDER — CARVEDILOL 3.125 MG PO TABS: 3.1250 mg | ORAL_TABLET | Freq: Two times a day (BID) | ORAL | 0 refills | Status: DC

## 2018-05-02 MED ORDER — AZITHROMYCIN 250 MG PO TABS: ORAL_TABLET | ORAL | 0 refills | Status: DC

## 2018-05-02 MED ORDER — ATORVASTATIN CALCIUM 80 MG PO TABS: 80.0000 mg | ORAL_TABLET | Freq: Every day | ORAL | 0 refills | Status: DC

## 2018-05-02 MED ORDER — INSULIN ASPART 100 UNIT/ML ~~LOC~~ SOLN: 0.0000 [IU] | Freq: Three times a day (TID) | SUBCUTANEOUS | Status: DC

## 2018-05-02 MED FILL — AZITHROMYCIN 250 MG TABLET: 250 | 4 days supply | Qty: 4 | Fill #0

## 2018-05-02 MED FILL — CARVEDILOL 3.125 MG TABLET: 3.125 | 30 days supply | Qty: 60 | Fill #0

## 2018-05-02 MED FILL — ATORVASTATIN CALCIUM 80 MG: 80 | 30 days supply | Qty: 30 | Fill #0

## 2018-05-02 MED FILL — LOSARTAN POTASSIUM 25 MG TA: 25 | 30 days supply | Qty: 30 | Fill #0

## 2018-05-02 MED FILL — FUROSEMIDE 40 MG TABLET: 40 | 30 days supply | Qty: 30 | Fill #0

## 2018-05-02 MED FILL — XARELTO 20 MG TABLET: 20 | 30 days supply | Qty: 30 | Fill #0

## 2018-05-02 MED FILL — predniSONE 20 MG TABS: 20 | 3 days supply | Qty: 6 | Fill #0

## 2018-05-02 NOTE — Discharge Instructions (Signed)

## 2018-05-02 NOTE — Discharge Summary (Signed)
Name: Jared [name] MRN: [id number] DOB: 05-20-1956 62 y.o. PCP: [name], MD  Date of Admission: 05/01/2018 11:22 AM Date of Discharge: 05/02/2018 Attending Physician: Doneen Poisson  Discharge Diagnosis: 1. Acute COPD Exacerbation 2. Chronic Systolic and Diastolic Heart Failure 3. Atrial Flutter  Discharge Medications: Allergies as of 05/02/2018      Reactions   Lisinopril Cough      Medication List    STOP taking these medications   bisoprolol 5 MG tablet Commonly known as:  ZEBETA   doxycycline 100 MG capsule Commonly known as:  VIBRAMYCIN   Glycopyrrolate-Formoterol 9-4.8 MCG/ACT Aero   HYDROcodone-acetaminophen 5-325 MG tablet Commonly known as:  NORCO/VICODIN   rosuvastatin 10 MG tablet Commonly known as:  CRESTOR   sacubitril-valsartan 49-51 MG Commonly known as:  ENTRESTO   spironolactone 25 MG tablet Commonly known as:  ALDACTONE   warfarin 7.5 MG tablet Commonly known as:  COUMADIN     TAKE these medications   albuterol 108 (90 Base) MCG/ACT inhaler Commonly known as:  PROVENTIL HFA;VENTOLIN HFA Inhale 2 puffs into the lungs every 6 (six) hours as needed for wheezing or shortness of breath.   atorvastatin 80 MG tablet Commonly known as:  LIPITOR Take 1 tablet (80 mg total) by mouth daily.   azithromycin 250 MG tablet Commonly known as:  ZITHROMAX Please take one tablet per day for the next four days   carvedilol 3.125 MG tablet Commonly known as:  COREG Take 1 tablet (3.125 mg total) by mouth 2 (two) times daily.   docusate sodium 100 MG capsule Commonly known as:  COLACE Take 1 capsule (100 mg total) by mouth every 12 (twelve) hours.   furosemide 40 MG tablet Commonly known as:  LASIX Take 1 tablet (40 mg total) by mouth daily.   hyoscyamine 0.125 MG SL tablet Commonly known as:  LEVSIN SL Place 1 tablet (0.125 mg total) under the tongue every 8 (eight) hours as needed.   losartan 25 MG tablet Commonly known as:   COZAAR Take 1 tablet (25 mg total) by mouth daily.   OXYGEN Place 2 L into the nose as needed. 2lpm when needed per pt   polyethylene glycol powder powder Commonly known as:  GLYCOLAX/MIRALAX Take 17 g by mouth 2 (two) times daily. Until daily soft stools  OTC   predniSONE 20 MG tablet Commonly known as:  DELTASONE Take 2 tablets (40 mg total) by mouth daily with breakfast.   rivaroxaban 20 MG Tabs tablet Commonly known as:  XARELTO Take 1 tablet (20 mg total) by mouth daily.       Disposition and follow-up:   Jared [name] was discharged from [facility] in Stable condition.  At the hospital follow up visit please address:  1. Acute COPD Exacerbation: Provided medications at discharge to complete treatment: prednisone 40 mg three days for a total of five. Zithromax 250 mg for a total of five days, Ventolin inhaler, and Spiriva with spacer. Previously not on his chronic medications for the past three months due to being unable to afford them. Previous only 2L home O2, with pulse ox with ambulation this was increased to 4L 2. Chronic Systolic and Diastolic Heart Failure: Discharged with one month supply Losartan 25 mg qd, coreg 3.125 bid and lasix 40 mg. 3. Atrial Flutter: discharged with one month supply xarelto.Previously on warfarin due to affordability but was not making all appointments for INR checks and had been unable to afford this recently.  2.  Labs / imaging needed at time of follow-up: none  3.  Pending labs/ test needing follow-up: none  Follow-up Appointments: Follow-up Information    [name], MD Follow up on 05/15/2018.   Specialty:  Family Medicine Why:  @ 9:30 am for hospital follow up- If you can not make this scheduled appointment please call to reschedule.  Contact information: [address] [place] [address] [phone]           Hospital Course by problem list: Jared [name] is a 62yo male with PMH tobacco use,  CAD with cath 2012, HTN, COPD, combined CHF (last EF 30%) and cardiomyopathy, and CAD who presented with increased SOB from baseline and chest pain that began the evening before admission. He also had had a productive cough for one month with clear sputum and had run out of his medications for the past three month without the funds to renew prescriptions. He was found to be in COPD exacerbation and was treated with duonebs, prednisone 40 mg, and azithromycin. With his sudden onset chest pain he was also worked up for ACS rule out. Troponins were trended and wnl. He was euvolemic on exam and repeat echo showed echo showed EF of 40-45%. While hospitalized he was continued on his home supplemental O2 of 2L. When trying to ambulate he was found to now required 4L O2, which may decrease with resolution of his COPD exacerbation. He has a history of a. Flutter for which he previously took warfarin. He was started on xarelto as he has been unable to consistently followup for INR checks, and he was discharged with one month supply of this medication and close follow-up with his PCP at [facility]. He was also started on blood pressure medications that were found to be more affordable and dischaged with losartan 25 mg qd, coreg 3.125 bid and lasix 40 mg qd and also atorvastatin 80mg . He ws also provided with albuterol, spiriva, and a spacer and instructions on how to use these medications.  Discharge Vitals:   BP (!) 164/113 (BP Location: Left Arm)   Pulse 100   Temp 98.2 F (36.8 C) (Oral)   Resp (!) 22   Ht 6\' 2"  (1.88 m)   Wt 90.7 kg   SpO2 91%   BMI 25.67 kg/m   Pertinent Labs, Studies, and Procedures:    Chest xray 11/22 IMPRESSION: Bronchitis pattern.  No consolidation or collapse.   Electronically Signed   By: Paulina Fusi M.D.   On: 05/01/2018 13:05  Echo 11/23 Study Conclusions  - Left ventricle: The cavity size was normal. Wall thickness was   normal. Systolic  function was mildly reduced. The estimated   ejection fraction was in the range of 45% to 50%. Wall motion was   normal; there were no regional wall motion abnormalities. Doppler   parameters are consistent with abnormal left ventricular   relaxation (grade 1 diastolic dysfunction). - Left atrium: The atrium was mildly dilated.  Impressions:  - EF has improved when compared to prior (35%)  Discharge Instructions: Discharge Instructions    Diet - low sodium heart healthy   Complete by:  As directed    Discharge instructions   Complete by:  As directed    You were hospitalized for COPD Exacerbation. Thank you for allowing [place] to be part of your care.   We arranged for you to follow up at:   [facility] Thursday May 15, 2018  at 9:30 AM   Please note these changes made to your medications:   Please START taking:  For your COPD: Azithromycin (ZITHROMAX) 250 mg tablet once per day for four days to help with any lung infection Prednisone (DELTASONE) 40 mg tablet, two tablets in the morning with breakfast for four days  Spiriva two puffs per day in the morning Albuterol two puffs twice per day as needed for shortness of breath   For your hypertension and chronic heart failure: Atorvastatin (LIPITOR) 80 mg tablet once per day  Carvedilol (COREG) 3.125 mg tablet one tablet two times per day, in the morning and evening Losartan (COZAAR) 25 mg tablet one tablet per day   For your atrial flutter:  Rivaroxaban (XARELTO) 20 mg tablet once per day   Please make sure to follow up with [facility] for your scheduled appointment to assure that your COPD is improving.   Increase activity slowly   Complete by:  As directed       Signed: Guinevere Scarlet A, DO 05/05/2018, 11:44 PM   Pager: [phone]

## 2018-05-02 NOTE — Progress Notes (Addendum)
   Subjective:  Shortness of breath improved today although he continues to have cough. Currently on his home supplement O2 of 2L. No chest pain except with ambulation this increases significantly. Duo-nebs helped his breathing during treatment but back to baseline SOB after treatment completed.   Objective:  Vital signs in last 24 hours: Vitals:   05/02/18 0215 05/02/18 0500 05/02/18 0700 05/02/18 0819  BP:  (!) 156/103  (!) 157/97  Pulse:  87  (!) 108  Resp:      Temp:      TempSrc:      SpO2: 92% 92% 97%   Weight:  90.7 kg    Height:       Physical Exam Constitution: NAD, lying supine in bed HEENT: [place] in place Cardio: RRR, no m/r/g Respiratory: bilateral wheeze, no rales or rhonchi, on 2L o2 Neuro: a&o, normal affect Skin: c/d/i, no edema    Assessment/Plan:  Principal Problem:   COPD exacerbation (HCC) Active Problems:   Chronic combined systolic and diastolic CHF (congestive heart failure) (HCC)  62yo male with PMH tobacco use, CAD with cath 2012, HTN, COPD, combined CHF (last EF 30%) and cardiomyopathy, and CAD who presented with increased SOB from baseline and chest pain that began suddenly yesterday evening.  Acute on Chronic COPD Exacerbation Troponins wnl. SOB improved from yesterday, chest pain only occurring with ambulation due to dyspnea. Saturating 97% on home supplemental O2 2L [place] currently. He continues to have cough and likely will for the next few days until antibiotics and his home medications take effect.    - check pulse ox with ambulation - echo pending  -  Duo-neb q6h prn - cont. Prednisone 40 mg  - cont. Azithromycin 5 mg 4 days  Chronic Systolic Heart Failure  - cont. Coreg & losartan  - echo pending  Atrial Flutter  - cont. xeralto  VTE: xeralto IVF: none  Diet: heart healthy Code: full   Dispo: Anticipated discharge in approximately today.   Guinevere Scarlet A, DO 05/02/2018, 10:41 AM Pager: [phone]

## 2018-05-02 NOTE — Progress Notes (Signed)
SATURATION QUALIFICATIONS: (This note is used to comply with regulatory documentation for home oxygen)  Patient Saturations on Room Air at Rest = 98 %  Patient Saturations on Room Air while Ambulating =  78 %  Patient Saturations on 4  Liters of oxygen while Ambulating =93 %  Please briefly explain why patient needs home oxygen:  Pt had c/o of shortness of breath as he was ambulating off oxygen. Respiration had become labored while ambulating also.

## 2018-05-02 NOTE — Care Management Note (Addendum)
Case Management Note  Patient Details  Name: Trevino Shakur MRN: [id number] Date of Birth: Dec 02, 1955  Subjective/Objective: Pt presented for COPD exacerbation. PTA from a Boarding house in [place]. Patient was previously utilizing [facility] for PCP needs and Pharmacy- patient needs to present paperwork for orange card. He has not done so as of yet. Per patient it has been a year since last visit.                  Action/Plan: CM did attempt to schedule an appointment- Office closed for lunch will try back. CM did speak with MD and she will send Rx's to [facility] to deliver to bedside. No further needs from CM at this time.   Expected Discharge Date:                  Expected Discharge Plan:  Home/Self Care  In-House Referral:  NA  Discharge planning Services  CM Consult, Follow-up appt scheduled, Indigent [facility] Clinic, Medication Assistance  Post Acute Care Choice:  NA Choice offered to:  NA  DME Arranged:  N/A DME Agency:  NA  HH Arranged:  NA HH Agency:  NA  Status of Service:  Completed, signed off  If discussed at Long Length of Stay Meetings, dates discussed:    Additional Comments: 1418 05-02-18 Tomi Bamberger, RN BSN [phone] Hospital follow up appointment scheduled. Patient has 02 with [facility]- CM did make referral to [facility] with increased liter flow. Protable tank to be delivered to room prior to transition home. No further needs from needs CM @ this time. Patient may get sister to transport home.  Gala Lewandowsky, RN 05/02/2018, 1:16 PM

## 2018-05-02 NOTE — Progress Notes (Signed)
  Echocardiogram 2D Echocardiogram has been performed.  Jared [name] 05/02/2018, 4:14 PM

## 2018-05-05 ENCOUNTER — Telehealth: Payer: Self-pay

## 2018-05-05 ENCOUNTER — Encounter: Payer: Self-pay | Admitting: Internal Medicine

## 2018-05-05 NOTE — Telephone Encounter (Signed)
Transition Care Management Follow-up Telephone Call  Date of discharge and from where: 04/01/2018, [facility]   How have you been since you were released from the hospital?  He stated that the is feeling " okay."   Any questions or concerns? None reported.   Items Reviewed:  Did the pt receive and understand the discharge instructions provided? He said that he has his discharge instructions but did not have them with him at the time of this call.  He said that he did not have any questions about the instructions.   Medications obtained and verified? He said that he has all medications and did not need to review the orders.  He also noted that he has the spacer for his inhaler and knows how to use it because he had one in the past.   Any new allergies since your discharge? Non reported   Do you have support at home? He explained that he is currently living in a boarding house and is basically independent.   Other (ie: DME, Home Health, etc) he has home O2 and stated that he uses it when he is moving around and only occasionally at night. He currently has 8-9 portable tanks and said that he takes the empty ones to [facility] when he needs new ones.    No home health ordered   Functional Questionnaire: (I = Independent and D = Dependent) ADL's: independent   Follow up appointments reviewed:    PCP Hospital f/u appt confirmed? Yes, he sees Dr Alvis Lemmings on 05/15/18 @ 0930 and he is aware of the appointment,   Specialist Hospital f/u appt confirmed? No specialist appointments recommended at this time.   Are transportation arrangements needed? He said that he would have a ride.    If their condition worsens, is the pt aware to call  their PCP or go to the ED?yes  Was the patient provided with contact information for the PCP's office or ED? He has the phone # for the clinic  ged to call back with questions or concerns? Yes.  He was also told to review his medication list and to call this  CM back with any questions. He was also told to bring his medications with him to his appointment on 05/15/18.  He understands that he has family planning medicaid and said that he is waiting for paperwork from his DSS caseworker to apply for full medicaid. He also noted that he has applied for disability. He currently receives about $120/month food stamps.

## 2018-05-12 ENCOUNTER — Other Ambulatory Visit: Payer: Self-pay

## 2018-05-12 ENCOUNTER — Emergency Department (HOSPITAL_COMMUNITY): Payer: Medicaid Other

## 2018-05-12 ENCOUNTER — Encounter (HOSPITAL_COMMUNITY): Payer: Self-pay | Admitting: *Deleted

## 2018-05-12 ENCOUNTER — Observation Stay (HOSPITAL_COMMUNITY)
Admission: EM | Admit: 2018-05-12 | Discharge: 2018-05-13 | Disposition: A | Discharge status: Home / Self Care | Payer: Medicaid Other | Attending: Student in an Organized Health Care Education/Training Program | Admitting: Student in an Organized Health Care Education/Training Program

## 2018-05-12 DIAGNOSIS — Z9981 Dependence on supplemental oxygen: Secondary | ICD-10-CM | POA: Insufficient documentation

## 2018-05-12 DIAGNOSIS — Z8673 Personal history of transient ischemic attack (TIA), and cerebral infarction without residual deficits: Secondary | ICD-10-CM | POA: Insufficient documentation

## 2018-05-12 DIAGNOSIS — Z888 Allergy status to other drugs, medicaments and biological substances status: Secondary | ICD-10-CM | POA: Diagnosis not present

## 2018-05-12 DIAGNOSIS — Z833 Family history of diabetes mellitus: Secondary | ICD-10-CM | POA: Insufficient documentation

## 2018-05-12 DIAGNOSIS — J9621 Acute and chronic respiratory failure with hypoxia: Secondary | ICD-10-CM | POA: Diagnosis not present

## 2018-05-12 DIAGNOSIS — F17210 Nicotine dependence, cigarettes, uncomplicated: Secondary | ICD-10-CM | POA: Diagnosis present

## 2018-05-12 DIAGNOSIS — Z79899 Other long term (current) drug therapy: Secondary | ICD-10-CM | POA: Insufficient documentation

## 2018-05-12 DIAGNOSIS — I700 Atherosclerosis of aorta: Secondary | ICD-10-CM | POA: Diagnosis not present

## 2018-05-12 DIAGNOSIS — I5043 Acute on chronic combined systolic (congestive) and diastolic (congestive) heart failure: Secondary | ICD-10-CM | POA: Diagnosis not present

## 2018-05-12 DIAGNOSIS — I4892 Unspecified atrial flutter: Secondary | ICD-10-CM | POA: Diagnosis not present

## 2018-05-12 DIAGNOSIS — J441 Chronic obstructive pulmonary disease with (acute) exacerbation: Secondary | ICD-10-CM | POA: Diagnosis present

## 2018-05-12 DIAGNOSIS — I110 Hypertensive heart disease with heart failure: Secondary | ICD-10-CM | POA: Diagnosis not present

## 2018-05-12 DIAGNOSIS — I420 Dilated cardiomyopathy: Secondary | ICD-10-CM | POA: Insufficient documentation

## 2018-05-12 DIAGNOSIS — I2510 Atherosclerotic heart disease of native coronary artery without angina pectoris: Secondary | ICD-10-CM | POA: Diagnosis not present

## 2018-05-12 DIAGNOSIS — I491 Atrial premature depolarization: Secondary | ICD-10-CM | POA: Insufficient documentation

## 2018-05-12 DIAGNOSIS — R7303 Prediabetes: Secondary | ICD-10-CM | POA: Insufficient documentation

## 2018-05-12 DIAGNOSIS — R0789 Other chest pain: Secondary | ICD-10-CM

## 2018-05-12 DIAGNOSIS — Z7901 Long term (current) use of anticoagulants: Secondary | ICD-10-CM | POA: Diagnosis not present

## 2018-05-12 DIAGNOSIS — Z8249 Family history of ischemic heart disease and other diseases of the circulatory system: Secondary | ICD-10-CM | POA: Diagnosis not present

## 2018-05-12 DIAGNOSIS — J40 Bronchitis, not specified as acute or chronic: Secondary | ICD-10-CM

## 2018-05-12 DIAGNOSIS — Z7951 Long term (current) use of inhaled steroids: Secondary | ICD-10-CM | POA: Diagnosis not present

## 2018-05-12 DIAGNOSIS — Z800 Family history of malignant neoplasm of digestive organs: Secondary | ICD-10-CM | POA: Diagnosis not present

## 2018-05-12 DIAGNOSIS — Z9119 Patient's noncompliance with other medical treatment and regimen: Secondary | ICD-10-CM | POA: Insufficient documentation

## 2018-05-12 LAB — BASIC METABOLIC PANEL
Anion gap: 9 (ref 5–15)
BUN: 14 mg/dL (ref 8–23)
CO2: 38 mmol/L — ABNORMAL HIGH (ref 22–32)
Calcium: 8.7 mg/dL — ABNORMAL LOW (ref 8.9–10.3)
Chloride: 96 mmol/L — ABNORMAL LOW (ref 98–111)
Creatinine, Ser: 1.13 mg/dL (ref 0.61–1.24)
GFR calc Af Amer: >60 mL/min (ref >60)
GFR calc non Af Amer: >60 mL/min (ref >60)
Glucose, Bld: 90 mg/dL (ref 70–99)
Potassium: 4.1 mmol/L (ref 3.5–5.1)
Sodium: 143 mmol/L (ref 135–145)

## 2018-05-12 LAB — CBC WITH DIFFERENTIAL/PLATELET
Abs Immature Granulocytes: 0.03 10*3/uL (ref 0.00–0.07)
Basophils Absolute: 0 10*3/uL (ref 0.0–0.1)
Basophils Relative: 0 %
Eosinophils Absolute: 0.2 10*3/uL (ref 0.0–0.5)
Eosinophils Relative: 2 %
HCT: 49.7 % (ref 39.0–52.0)
Hemoglobin: 14.8 g/dL (ref 13.0–17.0)
Immature Granulocytes: 0 %
Lymphocytes Relative: 38 %
Lymphs Abs: 3.8 10*3/uL (ref 0.7–4.0)
MCH: 28.8 pg (ref 26.0–34.0)
MCHC: 29.8 g/dL — ABNORMAL LOW (ref 30.0–36.0)
MCV: 96.7 fL (ref 80.0–100.0)
Monocytes Absolute: 0.9 10*3/uL (ref 0.1–1.0)
Monocytes Relative: 9 %
Neutro Abs: 5.1 10*3/uL (ref 1.7–7.7)
Neutrophils Relative %: 51 %
Platelets: 168 10*3/uL (ref 150–400)
RBC: 5.14 MIL/uL (ref 4.22–5.81)
RDW: 13.8 % (ref 11.5–15.5)
WBC: 9.9 10*3/uL (ref 4.0–10.5)
nRBC: 0 % (ref 0.0–0.2)

## 2018-05-12 LAB — I-STAT TROPONIN, ED: Troponin i, poc: 0 ng/mL (ref 0.00–0.08)

## 2018-05-12 LAB — BRAIN NATRIURETIC PEPTIDE: B Natriuretic Peptide: 208.9 pg/mL — ABNORMAL HIGH (ref 0.0–100.0)

## 2018-05-12 MED ORDER — IPRATROPIUM-ALBUTEROL 0.5-2.5 (3) MG/3ML IN SOLN
3.0000 mL | Freq: Once | RESPIRATORY_TRACT | Status: AC
  Administered 2018-05-12: 3 mL via RESPIRATORY_TRACT
  Filled 2018-05-12: qty 3

## 2018-05-12 MED ORDER — IPRATROPIUM-ALBUTEROL 0.5-2.5 (3) MG/3ML IN SOLN
3.0000 mL | Freq: Once | RESPIRATORY_TRACT | Status: DC
  Filled 2018-05-12: qty 3

## 2018-05-12 MED ORDER — ALBUTEROL (5 MG/ML) CONTINUOUS INHALATION SOLN
10.0000 mg/h | INHALATION_SOLUTION | Freq: Once | RESPIRATORY_TRACT | Status: AC
  Administered 2018-05-12: 10 mg/h via RESPIRATORY_TRACT
  Filled 2018-05-12: qty 20

## 2018-05-12 MED ORDER — ALBUTEROL SULFATE (2.5 MG/3ML) 0.083% IN NEBU
5.0000 mg | INHALATION_SOLUTION | Freq: Once | RESPIRATORY_TRACT | Status: AC
  Administered 2018-05-12: 5 mg via RESPIRATORY_TRACT
  Filled 2018-05-12: qty 6

## 2018-05-12 MED ORDER — ASPIRIN 81 MG PO CHEW
324.0000 mg | CHEWABLE_TABLET | Freq: Once | ORAL | Status: AC
  Administered 2018-05-12: 324 mg via ORAL
  Filled 2018-05-12: qty 4

## 2018-05-12 MED ORDER — IOPAMIDOL (ISOVUE-370) INJECTION 76%
100.0000 mL | Freq: Once | INTRAVENOUS | Status: AC | PRN
  Administered 2018-05-13: 67 mL via INTRAVENOUS

## 2018-05-12 MED ORDER — METHYLPREDNISOLONE SODIUM SUCC 125 MG IJ SOLR
125.0000 mg | Freq: Once | INTRAMUSCULAR | Status: AC
  Administered 2018-05-12: 125 mg via INTRAVENOUS
  Filled 2018-05-12: qty 2

## 2018-05-12 MED ORDER — IOPAMIDOL (ISOVUE-370) INJECTION 76%
INTRAVENOUS | Status: AC
  Filled 2018-05-12: qty 100

## 2018-05-12 NOTE — ED Provider Notes (Signed)
[facility] EMERGENCY DEPARTMENT Provider Note   CSN: [id number] Arrival date & time: 05/12/18  2101     History   Chief Complaint Chief Complaint  Patient presents with  . Shortness of Breath    HPI Jared [name] is a 62 y.o. male.  HPI   Patient is a 62 year old male with a history of CAD, systolic and diastolic heart failure, COPD, who presents to the emergency department today for evaluation of shortness of breath that began this morning and has progressively worsened throughout the day.  Patient states that he is also had some wheezing.  States that some of his oxygen tanks ran out however he was using an oxygen tank that "can never run out" when symptoms began.  He is normally on 2 L O2 at home.   Patient reports associated intermittent chest pain.  States pain starts on the right side of his chest and radiates to the left side of his chest.  States that he has had this intermittent chest pain for "a long time" states it has been present for several years. Sxs last for about 15-20 minutes at a time. Not associated with exertion or food. Can happen at anytime.   According to EMS, on arrival his O2 sats were 74% on room air.  He was placed on high flow nasal cannula and sats improved to 92%.  Patient reports no recent lower extremity swelling/pain.  Denies history of cancer or DVT/PE.  He was recently admitted to the hospital last month.  No recent surgeries or trauma.  Past Medical History:  Diagnosis Date  . CAD (coronary artery disease)    a. LHC 5/12:  LAD 20, pLCx 20, pRCA 40, dRCA 40, EF 35%, diff HK  //  b. Myoview 4/16: Overall Impression: High risk stress nuclear study There is no evidence of ischemia. There is severe LV dysfunction. LV Ejection Fraction: 30%. LV Wall Motion: There is global LV hypokinesis.   Marland Kitchen CAP (community acquired pneumonia) 09/2013  . Chronic combined systolic and diastolic CHF (congestive heart failure) (HCC)    a. Echo  4/16:Mild LVH, EF 40-45%, diffuse HK //  b. Echo 8/17: EF 35-40%, diffuse HK, diastolic dysfunction, aortic sclerosis, trivial MR, moderate LAE, normal RVSF, moderate RAE, mild TR, PASP 42 mmHg // c. Echo 4/18: Mild concentric LVH, EF 30-35, normal wall motion, grade 1 diastolic dysfunction, PASP 49  . Cluster headache    "hx; haven't had one in awhile" (01/09/2016)  . COPD (chronic obstructive pulmonary disease) (HCC)    [name] 01/09/2016  . History of CVA (cerebrovascular accident)   . Hypertension   . NICM (nonischemic cardiomyopathy) (HCC)   . Tobacco abuse     Patient Active Problem List   Diagnosis Date Noted  . Shortness of breath 05/01/2018  . Noncompliance 06/14/2017  . Perirectal abscess 06/04/2017  . Elevated LFTs 08/13/2016  . Periumbilical abdominal pain 08/13/2016  . Chronic respiratory failure with hypoxia (HCC) 07/24/2016  . Encounter for therapeutic drug monitoring 07/11/2016  . Atrial flutter (HCC)   . Hepatic congestion 07/02/2016  . Chronic combined systolic and diastolic CHF (congestive heart failure) (HCC) 07/01/2016  . COPD exacerbation (HCC) 06/03/2016  . Prediabetes 05/14/2016  . Hemoptysis 05/06/2016  . COPD GOLD III/still smoking  02/29/2016  . Cigarette smoker 01/09/2016  . CAD (coronary artery disease) 10/07/2013  . 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 Centrilobular emphysema (HCC) 10/04/2013  . Pulmonary infiltrate in right lung on CXR 10/03/2013  .  Essential hypertension     Past Surgical History:  Procedure Laterality Date  . CARDIAC CATHETERIZATION  10/2010   LM normal, LAD with 20% irregularities, LCX with 20%, RCA with 40% prox and 40% distal - EF of 35%  . COLONOSCOPY W/ BIOPSIES AND POLYPECTOMY    . EXCISION MASS HEAD    . INCISION AND DRAINAGE PERIRECTAL ABSCESS N/A 06/05/2017   Procedure: IRRIGATION AND DEBRIDEMENT PERIRECTAL ABSCESS;  Surgeon: Andria Meuse, MD;  Location: [facility] OR;  Service: General;  Laterality:  N/A;  . VIDEO BRONCHOSCOPY Bilateral 05/08/2016   Procedure: VIDEO BRONCHOSCOPY WITH FLUORO;  Surgeon: Oretha Milch, MD;  Location: [facility] ENDOSCOPY;  Service: Cardiopulmonary;  Laterality: Bilateral;        Home Medications    Prior to Admission medications   Medication Sig Start Date End Date Taking? Authorizing Provider  albuterol (PROVENTIL HFA;VENTOLIN HFA) 108 (90 Base) MCG/ACT inhaler Inhale 2 puffs into the lungs every 6 (six) hours as needed for wheezing or shortness of breath. Patient not taking: Reported on 03/30/2018 11/20/16   [name], MD  atorvastatin (LIPITOR) 80 MG tablet Take 1 tablet (80 mg total) by mouth daily. 05/02/18   Seawell, Jaimie A, DO  azithromycin (ZITHROMAX) 250 MG tablet Please take one tablet per day for the next four days 05/02/18   Seawell, Jaimie A, DO  carvedilol (COREG) 3.125 MG tablet Take 1 tablet (3.125 mg total) by mouth 2 (two) times daily. 05/02/18 05/02/19  Seawell, Jaimie A, DO  docusate sodium (COLACE) 100 MG capsule Take 1 capsule (100 mg total) by mouth every 12 (twelve) hours. Patient not taking: Reported on 03/30/2018 06/02/17   Roxy Horseman, PA-C  furosemide (LASIX) 40 MG tablet Take 1 tablet (40 mg total) by mouth daily. 05/02/18 05/02/19  Seawell, Jaimie A, DO  hyoscyamine (LEVSIN SL) 0.125 MG SL tablet Place 1 tablet (0.125 mg total) under the tongue every 8 (eight) hours as needed. Patient not taking: Reported on 03/30/2018 08/08/16   Zehr, Princella Pellegrini, PA-C  losartan (COZAAR) 25 MG tablet Take 1 tablet (25 mg total) by mouth daily. 05/02/18   Seawell, Jaimie A, DO  OXYGEN Place 2 L into the nose as needed. 2lpm when needed per pt    [provider]  polyethylene glycol powder (GLYCOLAX/MIRALAX) powder Take 17 g by mouth 2 (two) times daily. Until daily soft stools  OTC Patient not taking: Reported on 03/30/2018 06/02/17   Roxy Horseman, PA-C  predniSONE (DELTASONE) 20 MG tablet Take 2 tablets (40 mg total) by mouth  daily with breakfast. 05/03/18   Seawell, [name] Campbell A, DO  rivaroxaban (XARELTO) 20 MG TABS tablet Take 1 tablet (20 mg total) by mouth daily. 05/02/18   Seawell, Shanon Brow, DO    Family History Family History  Problem Relation Age of Onset  . Heart disease Mother   . Diabetes Mother   . Colon cancer Mother   . Liver cancer Mother   . Cancer Father        type unknown  . Diabetes Sister        x 2  . Diabetes Brother     Social History Social History   Tobacco Use  . Smoking status: Current Every Day Smoker    Packs/day: 0.25    Years: 42.00    Pack years: 10.50    Types: Cigarettes  . Smokeless tobacco: Never Used  . Tobacco comment: 3 cigs daily  Substance Use Topics  . Alcohol use: Yes  Alcohol/week: 0.0 standard drinks    Comment: last drink was before xmas  . Drug use: No    Types: Cocaine, Marijuana    Comment: "nothing in 20 years"     Allergies   Lisinopril   Review of Systems Review of Systems  Constitutional: Negative for chills and fever.  HENT: Negative for ear pain and sore throat.   Eyes: Negative for visual disturbance.  Respiratory: Positive for cough and shortness of breath.   Cardiovascular: Positive for chest pain. Negative for palpitations and leg swelling.  Gastrointestinal: Negative for abdominal pain, constipation, diarrhea, nausea and vomiting.  Genitourinary: Negative for flank pain and hematuria.  Musculoskeletal: Negative for back pain.  Skin: Negative for color change and rash.  Neurological: Negative for dizziness, weakness, light-headedness, numbness and headaches.  All other systems reviewed and are negative.   Physical Exam  Updated Vital Signs BP (!) 154/104   Pulse 88   Temp 98 F (36.7 C) (Oral)   Resp (!) 21   Ht 6' 2.75" (1.899 m)   Wt 90.7 kg   SpO2 97%   BMI 25.17 kg/m   Physical Exam  Constitutional: He appears well-developed and well-nourished. He does not appear ill.  HENT:  Head: Normocephalic and  atraumatic.  Mouth/Throat: Oropharynx is clear and moist.  Eyes: Conjunctivae are normal.  Neck: Neck supple.  Cardiovascular: Normal rate and regular rhythm.  No murmur heard. Pulmonary/Chest: Effort normal. No respiratory distress. He has decreased breath sounds. He has wheezes.  Abdominal: Soft. Bowel sounds are normal. He exhibits no distension. There is no tenderness.  Musculoskeletal: He exhibits no edema.       Right lower leg: Normal. He exhibits no tenderness and no edema.       Left lower leg: Normal. He exhibits no tenderness and no edema.  Neurological: He is alert.  Skin: Skin is warm and dry.  Psychiatric: He has a normal mood and affect.  Nursing note and vitals reviewed.   ED Treatments / Results  Labs (all labs ordered are listed, but only abnormal results are displayed) Labs Reviewed  CBC WITH DIFFERENTIAL/PLATELET - Abnormal; Notable for the following components:      Result Value   MCHC 29.8 (*)    All other components within normal limits  BASIC METABOLIC PANEL - Abnormal; Notable for the following components:   Chloride 96 (*)    CO2 38 (*)    Calcium 8.7 (*)    All other components within normal limits  BRAIN NATRIURETIC PEPTIDE - Abnormal; Notable for the following components:   B Natriuretic Peptide 208.9 (*)    All other components within normal limits  I-STAT TROPONIN, ED    EKG EKG Interpretation  Date/Time:  Monday May 12 2018 21:16:36 EST Ventricular Rate:  85 PR Interval:    QRS Duration: 100 QT Interval:  390 QTC Calculation: 464 R Axis:   -75 Text Interpretation:  Sinus rhythm Atrial premature complexes Inferior infarct, old No significant change since last tracing Confirmed by Melene Plan [phone]) on 05/12/2018 11:20:47 PM   Radiology Dg Chest Portable 1 View  Result Date: 05/12/2018 CLINICAL DATA:  Shortness of breath. EXAM: PORTABLE CHEST 1 VIEW COMPARISON:  Radiographs of May 01, 2018. FINDINGS: The heart size and  mediastinal contours are within normal limits. Both lungs are clear. No pneumothorax or pleural effusion is noted. The visualized skeletal structures are unremarkable. IMPRESSION: No active disease. Electronically Signed   By: Lupita Raider, M.D.  On: 05/12/2018 21:50    Procedures Procedures (including critical care time)  Medications Ordered in ED Medications  ipratropium-albuterol (DUONEB) 0.5-2.5 (3) MG/3ML nebulizer solution 3 mL (3 mLs Nebulization Not Given 05/12/18 2301)  iopamidol (ISOVUE-370) 76 % injection (has no administration in time range)  iopamidol (ISOVUE-370) 76 % injection 100 mL (has no administration in time range)  albuterol (PROVENTIL) (2.5 MG/3ML) 0.083% nebulizer solution 5 mg (5 mg Nebulization Given 05/12/18 2128)  ipratropium-albuterol (DUONEB) 0.5-2.5 (3) MG/3ML nebulizer solution 3 mL (3 mLs Nebulization Given 05/12/18 2148)  methylPREDNISolone sodium succinate (SOLU-MEDROL) 125 mg/2 mL injection 125 mg (125 mg Intravenous Given 05/12/18 2150)  aspirin chewable tablet 324 mg (324 mg Oral Given 05/12/18 2148)  albuterol (PROVENTIL,VENTOLIN) solution continuous neb (10 mg/hr Nebulization Given 05/12/18 2330)     Initial Impression / Assessment and Plan / ED Course  I have reviewed the triage vital signs and the nursing notes.  Pertinent labs & imaging results that were available during my care of the patient were reviewed by me and considered in my medical decision making (see chart for details).     Final Clinical Impressions(s) / ED Diagnoses   Final diagnoses:  COPD exacerbation (HCC)  Atypical chest pain   Patient presenting with increasing shortness of breath that began this morning when he woke up.  Also reporting intermittent chest pain to the right side of his chest radiating to the left.  No pain with inspiration.  States he is compliant with his COPD medications at home.  He is still smoking tobacco.  Reports that he was noncompliant with his  Xarelto prior to his last hospital admission however he has been compliant since discharge.  According to EMS his O2 sats were 74% on room air he was placed on high flow nasal cannula.  On arrival to the ED, his oxygen was decreased to his normal 2 L O2 and he has maintain oxygen saturation above 95% throughout his stay.  Normal heart rate, afebrile with slightly elevated blood pressure.  Speaking in full sentences on exam.  Has diffuse wheezing throughout all lung fields with decreased airflow throughout.  No lower extremity swelling.  He was given 2 DuoNebs, Solu-Medrol and a continuous nebulizer in the emergency department.  EKG is unchanged from prior.  Troponin is negative.  CBC and BMP are reassuring.  BNP is slightly elevated but improved from prior.  Clinically patient does not appear to be in acute heart failure.  Chest x-ray negative for pneumonia, pneumothorax or pleural effusion.  Pt care signed out to Fayrene Helper, PA-C at shift change with plan to f/u on delta troponin and CTA of the chest. Will also plan for re-evaluation after continuous and have pt ambulate with pulse oxymetry. If workup is negative and patient is able to maintain sats without dyspnea or tachypnea then he can likely be treated as copd exacerbation as outpatient.   ED Discharge Orders    None       Rayne Du 05/12/18 2354    Melene Plan, DO 05/12/18 2357

## 2018-05-12 NOTE — ED Triage Notes (Signed)
Pt arrives with SOB this evening, hx of COPD. Pt says his oxygen ran out. SOB started prior to his O2 running out. Saturations 74 RA.

## 2018-05-13 ENCOUNTER — Other Ambulatory Visit: Payer: Self-pay

## 2018-05-13 ENCOUNTER — Encounter (HOSPITAL_COMMUNITY): Payer: Self-pay

## 2018-05-13 ENCOUNTER — Emergency Department (HOSPITAL_COMMUNITY): Payer: Medicaid Other

## 2018-05-13 DIAGNOSIS — I4892 Unspecified atrial flutter: Secondary | ICD-10-CM

## 2018-05-13 DIAGNOSIS — Z79899 Other long term (current) drug therapy: Secondary | ICD-10-CM

## 2018-05-13 DIAGNOSIS — J9621 Acute and chronic respiratory failure with hypoxia: Secondary | ICD-10-CM

## 2018-05-13 DIAGNOSIS — J449 Chronic obstructive pulmonary disease, unspecified: Secondary | ICD-10-CM

## 2018-05-13 DIAGNOSIS — I2510 Atherosclerotic heart disease of native coronary artery without angina pectoris: Secondary | ICD-10-CM

## 2018-05-13 DIAGNOSIS — F17210 Nicotine dependence, cigarettes, uncomplicated: Secondary | ICD-10-CM

## 2018-05-13 DIAGNOSIS — Z7901 Long term (current) use of anticoagulants: Secondary | ICD-10-CM

## 2018-05-13 DIAGNOSIS — Z9114 Patient's other noncompliance with medication regimen: Secondary | ICD-10-CM

## 2018-05-13 DIAGNOSIS — Z9981 Dependence on supplemental oxygen: Secondary | ICD-10-CM

## 2018-05-13 DIAGNOSIS — I5022 Chronic systolic (congestive) heart failure: Secondary | ICD-10-CM

## 2018-05-13 DIAGNOSIS — Z888 Allergy status to other drugs, medicaments and biological substances status: Secondary | ICD-10-CM

## 2018-05-13 DIAGNOSIS — I110 Hypertensive heart disease with heart failure: Secondary | ICD-10-CM

## 2018-05-13 LAB — BASIC METABOLIC PANEL
Anion gap: 10 (ref 5–15)
BUN: 17 mg/dL (ref 8–23)
CO2: 32 mmol/L (ref 22–32)
Calcium: 8.6 mg/dL — ABNORMAL LOW (ref 8.9–10.3)
Chloride: 97 mmol/L — ABNORMAL LOW (ref 98–111)
Creatinine, Ser: 1.01 mg/dL (ref 0.61–1.24)
GFR calc Af Amer: >60 mL/min (ref >60)
GFR calc non Af Amer: >60 mL/min (ref >60)
Glucose, Bld: 182 mg/dL — ABNORMAL HIGH (ref 70–99)
Potassium: 3.9 mmol/L (ref 3.5–5.1)
Sodium: 139 mmol/L (ref 135–145)

## 2018-05-13 LAB — HIV ANTIBODY (ROUTINE TESTING W REFLEX): HIV Screen 4th Generation wRfx: NONREACTIVE

## 2018-05-13 LAB — I-STAT TROPONIN, ED: Troponin i, poc: 0.01 ng/mL (ref 0.00–0.08)

## 2018-05-13 MED ORDER — RIVAROXABAN 20 MG PO TABS
20.0000 mg | ORAL_TABLET | Freq: Every day | ORAL | Status: DC
  Filled 2018-05-13: qty 1

## 2018-05-13 MED ORDER — IPRATROPIUM-ALBUTEROL 0.5-2.5 (3) MG/3ML IN SOLN
3.0000 mL | Freq: Four times a day (QID) | RESPIRATORY_TRACT | Status: DC
  Administered 2018-05-13: 3 mL via RESPIRATORY_TRACT
  Filled 2018-05-13 (×2): qty 3

## 2018-05-13 MED ORDER — FLUTICASONE FUROATE-VILANTEROL 100-25 MCG/INH IN AEPB: 1.0000 | INHALATION_SPRAY | Freq: Every day | RESPIRATORY_TRACT | 0 refills | Status: DC

## 2018-05-13 MED ORDER — ACETAMINOPHEN 325 MG PO TABS: 650.0000 mg | ORAL_TABLET | Freq: Four times a day (QID) | ORAL | Status: DC | PRN

## 2018-05-13 MED ORDER — PREDNISONE 20 MG PO TABS
40.0000 mg | ORAL_TABLET | Freq: Every day | ORAL | Status: DC
  Administered 2018-05-13: 40 mg via ORAL
  Filled 2018-05-13: qty 2

## 2018-05-13 MED ORDER — ATORVASTATIN CALCIUM 80 MG PO TABS: 80.0000 mg | ORAL_TABLET | Freq: Every day | ORAL | Status: DC

## 2018-05-13 MED ORDER — CARVEDILOL 3.125 MG PO TABS
3.1250 mg | ORAL_TABLET | Freq: Two times a day (BID) | ORAL | Status: DC
  Administered 2018-05-13: 3.125 mg via ORAL
  Filled 2018-05-13: qty 1

## 2018-05-13 MED ORDER — POLYETHYLENE GLYCOL 3350 17 G PO PACK: 17.0000 g | PACK | Freq: Every day | ORAL | Status: DC | PRN

## 2018-05-13 MED ORDER — LOSARTAN POTASSIUM 25 MG PO TABS
25.0000 mg | ORAL_TABLET | Freq: Every day | ORAL | Status: DC
  Administered 2018-05-13: 25 mg via ORAL
  Filled 2018-05-13: qty 1

## 2018-05-13 MED ORDER — ACETAMINOPHEN 650 MG RE SUPP: 650.0000 mg | Freq: Four times a day (QID) | RECTAL | Status: DC | PRN

## 2018-05-13 MED ORDER — FUROSEMIDE 40 MG PO TABS
40.0000 mg | ORAL_TABLET | Freq: Every day | ORAL | Status: DC
  Administered 2018-05-13: 40 mg via ORAL
  Filled 2018-05-13: qty 1

## 2018-05-13 MED ORDER — IPRATROPIUM-ALBUTEROL 0.5-2.5 (3) MG/3ML IN SOLN
3.0000 mL | Freq: Four times a day (QID) | RESPIRATORY_TRACT | Status: DC
  Administered 2018-05-13: 3 mL via RESPIRATORY_TRACT
  Filled 2018-05-13: qty 3

## 2018-05-13 MED ORDER — MAGNESIUM SULFATE 2 GM/50ML IV SOLN
2.0000 g | Freq: Once | INTRAVENOUS | Status: AC
  Administered 2018-05-13: 2 g via INTRAVENOUS
  Filled 2018-05-13: qty 50

## 2018-05-13 MED FILL — BREO ELLIPTA 100-25 MCG INH: 100-25 | 30 days supply | Qty: 60 | Fill #0

## 2018-05-13 NOTE — ED Notes (Signed)
While ambulating in the hallway pt became symptomatic with SOB and o2 dropped to 86% on pt 3L

## 2018-05-13 NOTE — ED Notes (Signed)
Pt ambulated with pulse oxy and stats stayed between 98% but pt felt SOB when pt got back to the room was put on his 3 liters of o2, o2 droped to 92%

## 2018-05-13 NOTE — Care Management Note (Signed)
Case Management Note  Patient Details  Name: Jared [name] MRN: [id number] Date of Birth: 11-19-1955  Subjective/Objective:   62 yo male presented with COPD exacerbation.                 Action/Plan: CM met with patient to discuss transitional needs. Patient reports living in a boarding home, independent with ADLs PTA, with home O2 serviced by [facility]. CM consult acknowledged to assist with additional O2 tanks and portables for DC. CM spoke to [place], [facility] liaison, with patient listed as charity oxygen. Patient will need to f/u with [facility] to request delivery of additional O2 tanks; CM discussed with patient who verbalized understanding. (2) Portable O2 tanks delivered to patients room for transition home. Patient indicated his sister could provide transportation. PCP verified as: [name] ([facility]); patient follows at [facility] for Rx needs. No further needs from CM.   Expected Discharge Date:  05/14/18               Expected Discharge Plan:  Home/Self Care  In-House Referral:  NA  Discharge planning Services  CM Consult  Post Acute Care Choice:  Resumption of Svcs/PTA Provider Choice offered to:  NA  DME Arranged:  N/A DME Agency:  NA  HH Arranged:  NA HH Agency:  NA  Status of Service:  Completed, signed off  If discussed at [place] of Stay Meetings, dates discussed:    Additional Comments:  Midge Minium RN, BSN, NCM-BC, ACM-RN [phone] 05/13/2018, 3:00 PM

## 2018-05-13 NOTE — ED Notes (Signed)
BFAST TRAY ORDERED 

## 2018-05-13 NOTE — Discharge Instructions (Signed)

## 2018-05-13 NOTE — H&P (Addendum)
Date: 05/13/2018               Patient Name:  Jared [name] MRN: [id number]  DOB: 1956-02-06 Age / Sex: 62 y.o., male   PCP: [name], MD         Medical Service: Internal Medicine Teaching Service         Attending Physician: Dr. Oswaldo Done, Marquita [place]ms, *    First Contact: Dr. Dortha Schwalbe  Pager: [phone]  Second Contact: Dr. Frances Furbish Pager: [phone]       After Hours (After 5p/  First Contact Pager: [phone]  weekends / holidays): Second Contact Pager: [phone]   Chief Complaint: shortness of breath   History of Present Illness: Jared [name] is a 62 y/o gentleman with PMHx of HTN, oxygen dependent (2-4L) COPD, CAD, HFrEF who presents with acute onset shortness of breath beginning yesterday with associated lightheadedness. Patient recently admitted on 11/21 for COPD exacerbation and recently completed course of Azithromycin and Prednisone. He endorses compliance with Spiriva and Ventolin inhalers he was discharged on. His home oxygen was increased from 2 to 4L for him to use with exertion. He states he ran out of the portable tanks of O2 which allow him to move around his home while using O2. Lately, he has been doing daily tasks on room air. He became acutely dyspneic yesterday and couldn't hardly make it his bathroom and back without getting lightheaded and short of breath. 2 days ago he was able to complete tasks around the house without using any oxygen.  Patient denies syncope, increased cough, sputum production, URI symptoms, fevers, chills, chest pain, abdominal pain, n/v, urinary symptoms, changes in bowel movements, lower extremity swelling.    Meds:  No outpatient medications have been marked as taking for the 05/12/18 encounter [facility] Encounter).  Lipitor 80 mg daily Coreg 3.125 mg BID Lasix 40 mg daily Losartan 25 mg daily Xarelto 20 mg daily  Albuterol  Spiriva   Allergies: Allergies as of 05/12/2018 - Review Complete 05/01/2018  Allergen Reaction Noted  . Lisinopril  Cough 06/29/2014   Past Medical History:  Diagnosis Date  . CAD (coronary artery disease)    a. LHC 5/12:  LAD 20, pLCx 20, pRCA 40, dRCA 40, EF 35%, diff HK  //  b. Myoview 4/16: Overall Impression: High risk stress nuclear study There is no evidence of ischemia. There is severe LV dysfunction. LV Ejection Fraction: 30%. LV Wall Motion: There is global LV hypokinesis.   Marland Kitchen CAP (community acquired pneumonia) 09/2013  . Chronic combined systolic and diastolic CHF (congestive heart failure) (HCC)    a. Echo 4/16:Mild LVH, EF 40-45%, diffuse HK //  b. Echo 8/17: EF 35-40%, diffuse HK, diastolic dysfunction, aortic sclerosis, trivial MR, moderate LAE, normal RVSF, moderate RAE, mild TR, PASP 42 mmHg // c. Echo 4/18: Mild concentric LVH, EF 30-35, normal wall motion, grade 1 diastolic dysfunction, PASP 49  . Cluster headache    "hx; haven't had one in awhile" (01/09/2016)  . COPD (chronic obstructive pulmonary disease) (HCC)    [name] 01/09/2016  . History of CVA (cerebrovascular accident)   . Hypertension   . NICM (nonischemic cardiomyopathy) (HCC)   . Tobacco abuse     Family History:  Family History  Problem Relation Age of Onset  . Heart disease Mother   . Diabetes Mother   . Colon cancer Mother   . Liver cancer Mother   . Cancer Father        type  unknown  . Diabetes Sister        x 2  . Diabetes Brother     Social History: lives in a boarding house here in [place]; has not worked in over 2 years and is trying to get disability; smokes ~ 10 cigarettes/day; no EtOH or illicit drug use.   Review of Systems: A complete ROS was negative except as per HPI.   Physical Exam: Blood pressure 125/78, pulse (!) 108, temperature 98 F (36.7 C), temperature source Oral, resp. rate (!) 24, height 6' 2.75" (1.899 m), weight 90.7 kg, SpO2 (!) 86 %. General: awake, alert, lying in bed in NAD HEENT: [place]/AT; conjunctiva clear; PEERL; oropharynx without erythema or exudates Neck: supple;  no JVD CV: RRR; no murmurs, rubs or gallops Pulm: no increased work of breathing; decreased breath sounds throughout with scattered expiratory wheezes  Abd: BS+; abdomen is soft, non-tender, non-distended  Ext: no peripheral edema Neuro: A&Ox3; follows commands and answers questions appropriately; no focal deficits   EKG: personally reviewed my interpretation is sinus rhythm with q waves in inferior leads  CXR: personally reviewed my interpretation is hyperinflated lungs with flattened diaphragms; no effusion or infiltrate.   Assessment & Plan by Problem: Active Problems:   Acute on chronic respiratory failure with hypoxia (HCC)  1. Acute on chronic hypoxic respiratory failure: Patient presents with 2 days of acute onset shortness of breath. On EMS arrival sats were 74% on room air which improved to 92% on high flow nasal cannula. He is currently saturating well on 2 L nasal cannula. He does not meet criteria for COPD exacerbation, as he has had no worsening cough or sputum production. CXR negative of pneumonia, effusion, or pneumothorax. Patient does not appear volume overloaded on exam and BNP is lower than previous admissions which makes CHF exacerbation less likely. Unchanged EKG and negative troponins rule out acute cardiac ischemia. CTA was negative for PE. His presentation is most likely 2/2 not using home oxygen properly. Patient ran out of portable tanks and has been on room air with exertion.  - Received Solumedrol in the ED. Will continue Prednisone and Duonebs, as he sounds tight on exam with wheezing   - consult case management to ensure he will have home O2 on discharge   2. CAD  Chronic HFrEF  - most recent echo on 11/22 showed EF of 45-50% which was improved from 35% on previous echo in 2018.  - appears euvolemic on exam without signs of acute exacerbation - continuing home Lasix, Coreg, ASA and Lipitor   3. History of atrial flutter - currently in sinus rhythm - continued  home Xarelto and Coreg  4. HTN - normotensive  - continue home Losartan   Diet: heart healthy DVT ppx: Xarelto CODE: FULL   Dispo: Admit patient to Observation with expected length of stay less than 2 midnights.  SignedBridget Hartshorn, DO 05/13/2018, 4:56 AM  Pager: [phone]

## 2018-05-13 NOTE — Discharge Summary (Signed)
Name: Jared [name] MRN: [id number] DOB: [birth year] 62 y.o. PCP: Jared Register, MD  Date of Admission: 05/12/2018  9:03 PM Date of Discharge:  05/13/2018 Attending Physician: Jared [name], *  Discharge Diagnosis: 1.  Acute on chronic hypoxic respiratory failure secondary to supplemental O2 nonadherence 2.  COPD Gold stage III  Discharge Medications: Allergies as of 05/13/2018      Reactions   Lisinopril Cough      Medication List    TAKE these medications   albuterol 108 (90 Base) MCG/ACT inhaler Commonly known as:  PROVENTIL HFA;VENTOLIN HFA Inhale 2 puffs into the lungs every 6 (six) hours as needed for wheezing or shortness of breath.   atorvastatin 80 MG tablet Commonly known as:  LIPITOR Take 1 tablet (80 mg total) by mouth daily.   azithromycin 250 MG tablet Commonly known as:  ZITHROMAX Please take one tablet per day for the next four days   carvedilol 3.125 MG tablet Commonly known as:  COREG Take 1 tablet (3.125 mg total) by mouth 2 (two) times daily.   docusate sodium 100 MG capsule Commonly known as:  COLACE Take 1 capsule (100 mg total) by mouth every 12 (twelve) hours.   fluticasone furoate-vilanterol 100-25 MCG/INH Aepb Commonly known as:  BREO ELLIPTA Inhale 1 puff into the lungs daily.   furosemide 40 MG tablet Commonly known as:  LASIX Take 1 tablet (40 mg total) by mouth daily.   hyoscyamine 0.125 MG SL tablet Commonly known as:  LEVSIN SL Place 1 tablet (0.125 mg total) under the tongue every 8 (eight) hours as needed.   losartan 25 MG tablet Commonly known as:  COZAAR Take 1 tablet (25 mg total) by mouth daily.   OXYGEN Place 2 L into the nose daily. 2lpm when needed per pt   polyethylene glycol powder powder Commonly known as:  GLYCOLAX/MIRALAX Take 17 g by mouth 2 (two) times daily. Until daily soft stools  OTC   predniSONE 20 MG tablet Commonly known as:  DELTASONE Take 2 tablets (40 mg total) by mouth daily with  breakfast.   rivaroxaban 20 MG Tabs tablet Commonly known as:  XARELTO Take 20 mg by mouth daily with supper.       Disposition and follow-up:   Jared [name] was discharged from [facility] in [place] condition.  At the hospital follow up visit please address:  1.  COPD Gold stage III: Please ensure patient is adherent to supplemental oxygen and inhalers.  He was discharged with [facility] and was advised to use this in addition to Spiriva.  2.  Labs / imaging needed at time of follow-up: None  3.  Pending labs/ test needing follow-up: None  Follow-up Appointments: Follow-up Information    Jared Register, MD. Schedule an appointment as soon as possible for a visit in 1 week.   Specialty:  Family Medicine Why:  For recheck Contact information: [address] [place] [address] [phone]        [facility] EMERGENCY DEPARTMENT.   Specialty:  Emergency Medicine Why:  Return to the ER with any new or worsening symptoms Contact information: [address] [id number] mc [place] [address] [phone]       [facility] - Dme Follow up.   Why:  Please follow-up to request additional oxygen tanks Contact information: [address] [place] [address] [phone]           Hospital Course  by problem list: 1.  Acute hypoxic respiratory failure       Mild COPD exacerbation Jared [name] is a 62 year old African-American gentleman with hypertension, COPD Gold stage III chronically on home 2 to 4 L nasal cannula, coronary artery disease, HFrEF with EF45-50% who presented with acute onset shortness of breath and lightheadedness.  Prior to arrival, EMS had indicated that patient was hypoxic to the 70s which significantly improved with high flow nasal cannula and was transitioned to 3 L nasal cannula on arrival to the hospital.  During this admission he received IV Solu-Medrol, DuoNeb,  prednisone and was maintained on supplemental oxygen 3 L nasal cannula.  His vitals and clinical status was quite reassuring.  Patient did notes that he lives at a boarding care with 3 roommates and has concentrator O2 at home which he uses regularly however whenever he has to go to the kitchen, he does not typically use the supplemental oxygen and that usually causes him to feel short of breath.  In addition, he states that he had ran out of supplemental oxygen which was another contributing factor for his acute shortness of breath.  During his stay, case management was consulted to have proper arrangement of supplemental oxygen with [facility].  We were made aware that patient receives supplemental oxygen on charity and either has to call for additional tanks or personally reported to the office to exchange his oxygen tank.  On speaking to advise home care, additional tanks were delivered to patient's boarding care and he was provided 2 additional tanks in his room prior to discharge.  In addition, he was discharged on [facility] and was advised to use this in conjunction with his Spiriva daily.  He was also advised to cut back on smoking.  Discharge Vitals:   BP (!) 157/86   Pulse 80   Temp (!) 97.5 F (36.4 C) (Oral)   Resp 16   Ht 6' 2.75" (1.899 m)   Wt 90.7 kg   SpO2 95%   BMI 25.17 kg/m   Discharge Instructions: Discharge Instructions    Call MD for:  difficulty breathing, headache or visual disturbances   Complete by:  As directed    Diet - low sodium heart healthy   Complete by:  As directed    Discharge instructions   Complete by:  As directed    Jared [name],   It was a pleasure taking care of you here at the hospital. You were admitted because of difficulty breathing as you had run out of oxygen. Here are my recommendations:   1)we have arranged for extra oxygen to be delivered to your boarding care. If you are about to run out, please contact [facility] for  extra oxygen supply.  3)Please continue to use the concentrator oxygen on a daily basis to prevent another exacerbation of your breathing. 2)I have placed an order for additional inhaler called Breo Ellipta, you will take 1 puff once a day in addition to the spiriva.  ~Dr. Dortha Schwalbe   Increase activity slowly   Complete by:  As directed       Signed: Yvette Rack, MD 05/13/2018, 3:28 PM   Pager: [phone] IMTS PGY-1

## 2018-05-13 NOTE — ED Provider Notes (Signed)
Received sign out from [employer], PA-C.  Pt here with SOB.  Hx of COPD and CHF. Pt is actively wheezing, with chest tightness. He have received multiple duonebs as well as CAT without adequate relief.  Solumedrol and Mag Sulfate started.  Chest CTA pending.  Pt was symptomatic while walking but no hypoxia.  Anticipate admission for COPD exacerbation if CTA unremarkable.    2:44 AM Chest CT angiogram without evidence of PE.  Moderate emphysema along with bronchial wall thickening compatible with bronchitis.  BNP is 208 however improved from prior.  Normal delta troponin.  Patient received multiple breathing treatment however lungs still tight on reexamination.  When ambulate, O2 sats dropped down to 87% and patient was symptomatic as well as tachycardic.  Plan to admit for further management of his COPD exacerbation requiring multiple breathing treatments.  Patient also ran out of his supplemental O2 at home. Critical care time for COPD exacerbation requiring multiple breathing treatment.   3:12 AM Appreciate consultation from Internal Medicine Resident who agrees to see and admit pt for further care.   CRITICAL CARE Performed by: Fayrene Helper Total critical care time: 35 minutes Critical care time was exclusive of separately billable procedures and treating other patients. Critical care was necessary to treat or prevent imminent or life-threatening deterioration. Critical care was time spent personally by me on the following activities: development of treatment plan with patient and/or surrogate as well as nursing, discussions with consultants, evaluation of patient's response to treatment, examination of patient, obtaining history from patient or surrogate, ordering and performing treatments and interventions, ordering and review of laboratory studies, ordering and review of radiographic studies, pulse oximetry and re-evaluation of patient's condition.   BP 125/78   Pulse (!) 108   Temp 98 F  (36.7 C) (Oral)   Resp (!) 24   Ht 6' 2.75" (1.899 m)   Wt 90.7 kg   SpO2 (!) 86%   BMI 25.17 kg/m   Results for orders placed or performed during the hospital encounter of 05/12/18  CBC with Differential  Result Value Ref Range   WBC 9.9 4.0 - 10.5 K/uL   RBC 5.14 4.22 - 5.81 MIL/uL   Hemoglobin 14.8 13.0 - 17.0 g/dL   HCT 16.1 09.6 - 04.5 %   MCV 96.7 80.0 - 100.0 fL   MCH 28.8 26.0 - 34.0 pg   MCHC 29.8 (L) 30.0 - 36.0 g/dL   RDW 40.9 81.1 - 91.4 %   Platelets 168 150 - 400 K/uL   nRBC 0.0 0.0 - 0.2 %   Neutrophils Relative % 51 %   Neutro Abs 5.1 1.7 - 7.7 K/uL   Lymphocytes Relative 38 %   Lymphs Abs 3.8 0.7 - 4.0 K/uL   Monocytes Relative 9 %   Monocytes Absolute 0.9 0.1 - 1.0 K/uL   Eosinophils Relative 2 %   Eosinophils Absolute 0.2 0.0 - 0.5 K/uL   Basophils Relative 0 %   Basophils Absolute 0.0 0.0 - 0.1 K/uL   Immature Granulocytes 0 %   Abs Immature Granulocytes 0.03 0.00 - 0.07 K/uL  Basic metabolic panel  Result Value Ref Range   Sodium 143 135 - 145 mmol/L   Potassium 4.1 3.5 - 5.1 mmol/L   Chloride 96 (L) 98 - 111 mmol/L   CO2 38 (H) 22 - 32 mmol/L   Glucose, Bld 90 70 - 99 mg/dL   BUN 14 8 - 23 mg/dL   Creatinine, Ser 7.82 0.61 - 1.24  mg/dL   Calcium 8.7 (L) 8.9 - 10.3 mg/dL   GFR calc non Af Amer >60 >60 mL/min   GFR calc Af Amer >60 >60 mL/min   Anion gap 9 5 - 15  Brain natriuretic peptide  Result Value Ref Range   B Natriuretic Peptide 208.9 (H) 0.0 - 100.0 pg/mL  I-Stat Troponin, ED (not at [facility])  Result Value Ref Range   Troponin i, poc 0.00 0.00 - 0.08 ng/mL   Comment 3          I-Stat Troponin, ED (not at [facility])  Result Value Ref Range   Troponin i, poc 0.01 0.00 - 0.08 ng/mL   Comment 3           Dg Chest 2 View  Result Date: 05/01/2018 CLINICAL DATA:  Left-sided chest pain and shortness of breath beginning last night. Productive cough over the last 2 weeks. EXAM: CHEST - 2 VIEW COMPARISON:  11/21/2016 FINDINGS: Artifact overlies  the chest. Heart size is normal. Aortic atherosclerosis. There is mild central bronchial thickening but no evidence of infiltrate, collapse or effusion. No acute bone finding. Old healed rib fractures on the left. IMPRESSION: Bronchitis pattern.  No consolidation or collapse. Electronically Signed   By: Paulina Fusi M.D.   On: 05/01/2018 13:05   Ct Angio Chest Pe W And/or Wo Contrast  Result Date: 05/13/2018 CLINICAL DATA:  Shortness of breath, COPD. EXAM: CT ANGIOGRAPHY CHEST WITH CONTRAST TECHNIQUE: Multidetector CT imaging of the chest was performed using the standard protocol during bolus administration of intravenous contrast. Multiplanar CT image reconstructions and MIPs were obtained to evaluate the vascular anatomy. CONTRAST:  65mL ISOVUE-370 IOPAMIDOL (ISOVUE-370) INJECTION 76% COMPARISON:  Chest x-ray 05/12/2018.  Chest CT 12/25/2017. FINDINGS: Cardiovascular: Heart is normal size. Extensive coronary artery calcifications. Scattered aortic calcifications. No aneurysm. No filling defects in the pulmonary arteries to suggest pulmonary emboli. Mediastinum/Nodes: No mediastinal, hilar, or axillary adenopathy. Lungs/Pleura: Moderate emphysema. No confluent opacities or effusions. Airway thickening in the lower lobes bilaterally. No effusions. Upper Abdomen: Imaging into the upper abdomen shows no acute findings. Subtle micro nodular appearance of the liver surface suggests the possibility of cirrhosis. Musculoskeletal: Chest wall soft tissues are unremarkable. No acute bony abnormality. Review of the MIP images confirms the above findings. IMPRESSION: No evidence of pulmonary embolus. Moderate emphysema. Bronchial wall thickening, most prominent in the lower lobes compatible with bronchitis. No confluent opacities. Coronary artery disease. Aortic Atherosclerosis (ICD10-I70.0) and Emphysema (ICD10-J43.9). Electronically Signed   By: Charlett Nose M.D.   On: 05/13/2018 01:10   Dg Chest Portable 1  View  Result Date: 05/12/2018 CLINICAL DATA:  Shortness of breath. EXAM: PORTABLE CHEST 1 VIEW COMPARISON:  Radiographs of May 01, 2018. FINDINGS: The heart size and mediastinal contours are within normal limits. Both lungs are clear. No pneumothorax or pleural effusion is noted. The visualized skeletal structures are unremarkable. IMPRESSION: No active disease. Electronically Signed   By: Lupita Raider, M.D.   On: 05/12/2018 21:50       Fayrene Helper, PA-C 05/13/18 [id number]    Gilda Crease, MD 05/13/18 [phone]

## 2018-05-13 NOTE — Progress Notes (Signed)
   Subjective: HD #0  Jared [name] was examined at bedside and reports that his shortness of breath has significantly improved since admission.  He does not complain of cough, chest congestion, sputum production, fevers or chills.  While in the ED, he has been able to ambulate with supplemental oxygen with no difficulty.  He states that he has been requiring supplemental oxygen for about 2 years and usually obtains his oxygen supplies from [facility] however when he called the company 2 days ago they were not able to emergently deliver oxygen.  He intermittently uses supplemental O2 via oxygen concentrator and the oxygen tank.  Over the past couple months he has had increasing shortness of breath and reports that he continues to smoke 10 cigarettes/day.  He lives at a group home and reports he does not usually associate with his other tenants.  Objective:  Vital signs in last 24 hours: Vitals:   05/13/18 0330 05/13/18 0345 05/13/18 0400 05/13/18 0613  BP: (!) 141/75 (!) 145/82 (!) 150/89 (!) 154/103  Pulse: 78 79 85 87  Resp: 16 16 15 17   Temp:    97.6 F (36.4 C)  TempSrc:    Oral  SpO2: 96% 96% 96% 97%  Weight:      Height:       Const: in no apparent distress HEENT: Nasal canula in place Resp: Mild wheezing appreciated posteriorly  Assessment/Plan:  Active Problems:   Acute on chronic respiratory failure with hypoxia [facility])  Jared [name] is a 62 year old gentleman with Hypertension, COPD GOLD III chronically on home 2-4L nasal canula, CAD, HFrEF with EF45-50% here for management of acute hypoxic respiratory failure due to difficulty obtaining home supplemental oxygen.   Acute on chronic hypoxic respiratory failure: He has COPD gold stage III and continues to smoke about 10 cigarettes/day.  In regards to clinical status he has significantly improved since admission yesterday and has been maintaining reassuring oxygen saturation at 96% on 2-3L.  He continues to deny cough or sputum  production.  He reports that he ran out of supplemental oxygen and try to get in contact with [facility] but was unable to have urgent delivery of supplemental oxygen tank.  He does have an oxygen concentrator at home however states that the tube is too short for him to maneuver around his apartment.  Initially he was advised to use his supplemental oxygen for exertion only however given that he continues to smoke and is most likely nearing terminal COPD, he might require daily supplemental oxygen. -Prednisone 40 mg daily -Continue supplemental oxygen -Continue DuoNeb every 6 hours -Per case management, [facility] will be contacted to evaluate if his supplemental oxygen orders would have to be revised  HFrEF: Continue Lasix, Coreg  Coronary arterial disease: Continue aspirin and Lipitor  Hx of atrial flutter: Continue Xarelto and Coreg  HTN: Current BP 157/86.  Continue losartan  FEN: Replace electrolytes as needed, Heart healthy diet  VTE ppx: Xarelto  CODE status: FULL Code  Dispo: Admit patient to Observation with expected length of stay less than 2 midnights.  Yvette Rack, MD 05/13/2018, 7:34 AM Pager: [phone] IMTS PGY-1

## 2018-05-13 NOTE — Evaluation (Signed)
Occupational Therapy Evaluation Patient Details Name: Jared [name] MRN: [id number] DOB: 1956-05-28 Today's Date: 05/13/2018    History of Present Illness Jared [name] is a 62 y/o gentleman with PMHx of HTN, oxygen dependent (2-4L) COPD, CAD, HFrEF who presents with acute onset shortness of breath beginning yesterday with associated lightheadedness. Patient recently admitted on 11/21 for COPD exacerbation and recently completed course of Azithromycin and Prednisone   Clinical Impression   Pt admitted with above problem list and now presenting with SOB and reduced respiratory support. Despite limitations in functional activity tolerance pt is at baseline, performing ADLs and transfers with mod I. Recommend shower chair for energy conservation. No further OT needs, OT will sign off.     Follow Up Recommendations  No OT follow up    Equipment Recommendations  Tub/shower seat    Recommendations for Other Services PT consult     Precautions / Restrictions Precautions Precautions: Fall Restrictions Weight Bearing Restrictions: No      Mobility Bed Mobility Overal bed mobility: Modified Independent                Transfers Overall transfer level: Modified independent Equipment used: None             General transfer comment: mild balance deficits    Balance Overall balance assessment: Mild deficits observed, not formally tested                                         ADL either performed or assessed with clinical judgement   ADL Overall ADL's : At baseline;Modified independent                                       General ADL Comments: Mild balance deficits observed during LB dressing. Vc for safety/fall prevention      Vision Baseline Vision/History: No visual deficits Patient Visual Report: No change from baseline Vision Assessment?: No apparent visual deficits            Pertinent Vitals/Pain Pain Assessment: No/denies  pain     Hand Dominance Right   Extremity/Trunk Assessment Upper Extremity Assessment Upper Extremity Assessment: Overall WFL for tasks assessed   Lower Extremity Assessment Lower Extremity Assessment: Defer to PT evaluation   Cervical / Trunk Assessment Cervical / Trunk Assessment: Normal   Communication Communication Communication: No difficulties   Cognition Arousal/Alertness: Awake/alert Behavior During Therapy: WFL for tasks assessed/performed Overall Cognitive Status: Within Functional Limits for tasks assessed                                 General Comments: Pt frustrated with condition   General Comments  Pt on 3L [place], SpO2 at >95% throughout session            Home Living Family/patient expects to be discharged to:: Group home Living Arrangements: Alone                               Additional Comments: 4 steps, single level group home, with tub shower      Prior Functioning/Environment Level of Independence: Independent  OT Problem List: Impaired balance (sitting and/or standing)         OT Goals(Current goals can be found in the care plan section) Acute Rehab OT Goals Patient Stated Goal: "get back to not needing O2" OT Goal Formulation: All assessment and education complete, DC therapy   AM-PAC OT "6 Clicks" Daily Activity     Outcome Measure Help from another person eating meals?: None Help from another person taking care of personal grooming?: None Help from another person toileting, which includes using toliet, bedpan, or urinal?: None Help from another person bathing (including washing, rinsing, drying)?: None Help from another person to put on and taking off regular upper body clothing?: None Help from another person to put on and taking off regular lower body clothing?: None 6 Click Score: 24   End of Session Equipment Utilized During Treatment: Oxygen Nurse Communication: Mobility  status  Activity Tolerance: Patient tolerated treatment well Patient left: in bed;with call bell/phone within reach  OT Visit Diagnosis: Unsteadiness on feet (R26.81)                Time: 8937-3428 OT Time Calculation (min): 13 min Charges:  OT General Charges $OT Visit: 1 Visit OT Evaluation $OT Eval Low Complexity: 1 Low  Crissie Reese OTR/L  05/13/2018, 3:20 PM

## 2018-05-14 ENCOUNTER — Telehealth: Payer: Self-pay

## 2018-05-14 NOTE — Telephone Encounter (Signed)
Transition Care Management Follow-up Telephone Call  Date of discharge and from where: 05/13/2018, [facility]   How have you been since you were released from the hospital? He said that he doesn't feel' quite right" yet and is not sure if it is due to his medication. He could not further describe why he feels this way.  He said that he would discuss with his doctor tomorrow. He said that he does not feel that he needs to go back to the hospital   Any questions or concerns? Concern about how he feels is noted above.   He said that he received paperwork from DSS about applying for full medicaid and he signed the documents and sent back to DSS.   Items Reviewed:  Did the pt receive and understand the discharge instructions provided? He has them but said that he doesn't understand all of the words.  He wants to review in person.  Medications obtained and verified? He said that he has all medications except the [facility] and will need to get that tomorrow.  He again noted that he doesn't understand all of the words on the medication list and would like to review in person.   Any new allergies since your discharge? None reported   Do you have support at home? Lives n boarding house and is basically independent  Other (ie: DME, Home Health, etc) home O2 from [facility]. He said that he just had extra tanks delivered today.  Functional Questionnaire: (I = Independent and D = Dependent) ADL's: indpendent   Follow up appointments reviewed:    PCP Hospital f/u appt confirmed?  Confirmed his appointment wit Dr Alvis Lemmings tomorrow - 05/15/18 @ 0930  Specialist Hospital f/u appt confirmed? No appointments recommended at this time as per AVS  Are transportation arrangements needed? He said that he has a ride  If their condition worsens, is the pt aware to call  their PCP or go to the ED? yes  Was the patient provided with contact information for the PCP's office or ED? He has the number for the  clinic  Was the pt encouraged to call back with questions or concerns? Yes and he has an appointment tomorrow in the clinic

## 2018-05-15 ENCOUNTER — Encounter: Payer: Self-pay | Admitting: Family Medicine

## 2018-05-15 ENCOUNTER — Ambulatory Visit: Payer: Medicaid Other | Attending: Family Medicine | Admitting: Family Medicine

## 2018-05-15 VITALS — BP 173/88 | HR 63 | Temp 98.3°F | Ht 75.0 in | Wt 208.4 lb

## 2018-05-15 DIAGNOSIS — I4892 Unspecified atrial flutter: Secondary | ICD-10-CM | POA: Diagnosis not present

## 2018-05-15 DIAGNOSIS — I5042 Chronic combined systolic (congestive) and diastolic (congestive) heart failure: Secondary | ICD-10-CM | POA: Diagnosis not present

## 2018-05-15 DIAGNOSIS — I428 Other cardiomyopathies: Secondary | ICD-10-CM | POA: Diagnosis not present

## 2018-05-15 DIAGNOSIS — J9611 Chronic respiratory failure with hypoxia: Secondary | ICD-10-CM

## 2018-05-15 DIAGNOSIS — R251 Tremor, unspecified: Secondary | ICD-10-CM | POA: Diagnosis not present

## 2018-05-15 DIAGNOSIS — Z7901 Long term (current) use of anticoagulants: Secondary | ICD-10-CM | POA: Diagnosis not present

## 2018-05-15 DIAGNOSIS — I420 Dilated cardiomyopathy: Secondary | ICD-10-CM | POA: Diagnosis not present

## 2018-05-15 DIAGNOSIS — I110 Hypertensive heart disease with heart failure: Secondary | ICD-10-CM | POA: Insufficient documentation

## 2018-05-15 DIAGNOSIS — Z716 Tobacco abuse counseling: Secondary | ICD-10-CM | POA: Insufficient documentation

## 2018-05-15 DIAGNOSIS — Z09 Encounter for follow-up examination after completed treatment for conditions other than malignant neoplasm: Secondary | ICD-10-CM | POA: Diagnosis present

## 2018-05-15 DIAGNOSIS — F17200 Nicotine dependence, unspecified, uncomplicated: Secondary | ICD-10-CM | POA: Insufficient documentation

## 2018-05-15 DIAGNOSIS — Z79899 Other long term (current) drug therapy: Secondary | ICD-10-CM | POA: Diagnosis not present

## 2018-05-15 DIAGNOSIS — Z8673 Personal history of transient ischemic attack (TIA), and cerebral infarction without residual deficits: Secondary | ICD-10-CM | POA: Insufficient documentation

## 2018-05-15 DIAGNOSIS — Z888 Allergy status to other drugs, medicaments and biological substances status: Secondary | ICD-10-CM | POA: Diagnosis not present

## 2018-05-15 DIAGNOSIS — I2510 Atherosclerotic heart disease of native coronary artery without angina pectoris: Secondary | ICD-10-CM | POA: Diagnosis not present

## 2018-05-15 DIAGNOSIS — J449 Chronic obstructive pulmonary disease, unspecified: Secondary | ICD-10-CM | POA: Diagnosis not present

## 2018-05-15 MED ORDER — FUROSEMIDE 40 MG PO TABS: 40.0000 mg | ORAL_TABLET | Freq: Every day | ORAL | 0 refills | Status: DC

## 2018-05-15 MED ORDER — LOSARTAN POTASSIUM 50 MG PO TABS: 50.0000 mg | ORAL_TABLET | Freq: Every day | ORAL | 6 refills | Status: DC

## 2018-05-15 MED ORDER — ATORVASTATIN CALCIUM 80 MG PO TABS: 80.0000 mg | ORAL_TABLET | Freq: Every day | ORAL | 6 refills | Status: DC

## 2018-05-15 MED ORDER — FLUTICASONE FUROATE-VILANTEROL 100-25 MCG/INH IN AEPB: 1.0000 | INHALATION_SPRAY | Freq: Every day | RESPIRATORY_TRACT | 6 refills | Status: DC

## 2018-05-15 MED ORDER — RIVAROXABAN 20 MG PO TABS: 20.0000 mg | ORAL_TABLET | Freq: Every day | ORAL | 6 refills | Status: DC

## 2018-05-15 MED ORDER — ALBUTEROL SULFATE HFA 108 (90 BASE) MCG/ACT IN AERS: 2.0000 | INHALATION_SPRAY | Freq: Four times a day (QID) | RESPIRATORY_TRACT | 5 refills | Status: DC | PRN

## 2018-05-15 MED ORDER — CARVEDILOL 3.125 MG PO TABS: 3.1250 mg | ORAL_TABLET | Freq: Two times a day (BID) | ORAL | 6 refills | Status: DC

## 2018-05-15 MED FILL — ALBUTEROL SULFATE HFA 108 (: 108 (90 BAS | 25 days supply | Qty: 18 | Fill #0

## 2018-05-15 NOTE — Progress Notes (Signed)
Patient states that his hands are shaking.

## 2018-05-15 NOTE — Progress Notes (Signed)
TRANSITIONAL [facility]ARE CLINIC  Date of Telephone Encounter: 05/14/2018  Date of 1st service: 05/15/2018   Admit Date: 05/12/2018 Discharge Date: 05/13/2018     Subjective:  Patient ID: Jared [name], male    DOB: [birth year]  Age: 62 y.o. MRN: [id number]  CC: Hospitalization Follow-up   HPI Jared [name] is a 62 year old male with a history of hypertension, COPD, tobacco abuse, NICM , CHF (EF 45-50% from echo 04/2018  improved from  30-35% in 09/2016), atrial flutter hospitalized for acute hypoxic respiratory failure secondary to COPD exacerbation at [facility]. He had presented with an oxygen saturation of 70% which had improved with oxygen supplementation by EMS.  He had also had a cough productive of white phlegm. Treated with Solu-Medrol, DuoNeb and maintained on supplemental oxygen with improvement in symptoms. Chest x-ray revealed no active disease, CT angiogram of the chest revealed no evidence of PE, moderate emphysema, bronchial wall thickening prominent in the lower lobes compatible with bronchitis. He had been hospitalized 2 weeks prior to his most recent hospitalization for acute COPD exacerbation which he was discharged on Zithromax, prednisone as he has been off his medications.  Today he complains of tremors which are both with activity and at rest, he describes this as feeling high like he is on drugs and this has been since discharge.  He denies use of recreational drugs and has no known history of thyroid disorder. He does have oxygen tanks which have been supplied by advanced home care.  Past Medical History:  Diagnosis Date  . CAD (coronary artery disease)    a. LHC 5/12:  LAD 20, pLCx 20, pRCA 40, dRCA 40, EF 35%, diff HK  //  b. Myoview 4/16: Overall Impression: High risk stress nuclear study There is no evidence of ischemia. There is severe LV dysfunction. LV Ejection Fraction: 30%. LV Wall Motion: There is global LV hypokinesis.   Marland Kitchen CAP (community acquired  pneumonia) 09/2013  . Chronic combined systolic and diastolic CHF (congestive heart failure) (HCC)    a. Echo 4/16:Mild LVH, EF 40-45%, diffuse HK //  b. Echo 8/17: EF 35-40%, diffuse HK, diastolic dysfunction, aortic sclerosis, trivial MR, moderate LAE, normal RVSF, moderate RAE, mild TR, PASP 42 mmHg // c. Echo 4/18: Mild concentric LVH, EF 30-35, normal wall motion, grade 1 diastolic dysfunction, PASP 49  . Cluster headache    "hx; haven't had one in awhile" (01/09/2016)  . COPD (chronic obstructive pulmonary disease) (HCC)    [name] 01/09/2016  . History of CVA (cerebrovascular accident)   . Hypertension   . NICM (nonischemic cardiomyopathy) (HCC)   . Tobacco abuse     Past Surgical History:  Procedure Laterality Date  . CARDIAC CATHETERIZATION  10/2010   LM normal, LAD with 20% irregularities, LCX with 20%, RCA with 40% prox and 40% distal - EF of 35%  . COLONOSCOPY W/ BIOPSIES AND POLYPECTOMY    . EXCISION MASS HEAD    . INCISION AND DRAINAGE PERIRECTAL ABSCESS N/A 06/05/2017   Procedure: IRRIGATION AND DEBRIDEMENT PERIRECTAL ABSCESS;  Surgeon: Andria Meuse, MD;  Location: [facility] OR;  Service: General;  Laterality: N/A;  . VIDEO BRONCHOSCOPY Bilateral 05/08/2016   Procedure: VIDEO BRONCHOSCOPY WITH FLUORO;  Surgeon: Oretha Milch, MD;  Location: [facility] ENDOSCOPY;  Service: Cardiopulmonary;  Laterality: Bilateral;    Allergies  Allergen Reactions  . Lisinopril Cough     Outpatient Medications Prior to Visit  Medication Sig Dispense Refill  . albuterol (PROVENTIL HFA;VENTOLIN HFA) 108 (  90 Base) MCG/ACT inhaler Inhale 2 puffs into the lungs every 6 (six) hours as needed for wheezing or shortness of breath. 1 Inhaler 5  . atorvastatin (LIPITOR) 80 MG tablet Take 1 tablet (80 mg total) by mouth daily. 30 tablet 0  . carvedilol (COREG) 3.125 MG tablet Take 1 tablet (3.125 mg total) by mouth 2 (two) times daily. 60 tablet 0  . fluticasone furoate-vilanterol (BREO ELLIPTA) 100-25  MCG/INH AEPB Inhale 1 puff into the lungs daily. 60 each 0  . furosemide (LASIX) 40 MG tablet Take 1 tablet (40 mg total) by mouth daily. 30 tablet 0  . losartan (COZAAR) 25 MG tablet Take 1 tablet (25 mg total) by mouth daily. 30 tablet 0  . rivaroxaban (XARELTO) 20 MG TABS tablet Take 20 mg by mouth daily with supper.    . docusate sodium (COLACE) 100 MG capsule Take 1 capsule (100 mg total) by mouth every 12 (twelve) hours. (Patient not taking: Reported on 03/30/2018) 30 capsule 0  . hyoscyamine (LEVSIN SL) 0.125 MG SL tablet Place 1 tablet (0.125 mg total) under the tongue every 8 (eight) hours as needed. (Patient not taking: Reported on 03/30/2018) 30 tablet 1  . OXYGEN Place 2 L into the nose daily. 2lpm when needed per pt    . polyethylene glycol powder (GLYCOLAX/MIRALAX) powder Take 17 g by mouth 2 (two) times daily. Until daily soft stools  OTC (Patient not taking: Reported on 03/30/2018) 250 g 0  . azithromycin (ZITHROMAX) 250 MG tablet Please take one tablet per day for the next four days (Patient not taking: Reported on 05/13/2018) 4 tablet 0  . predniSONE (DELTASONE) 20 MG tablet Take 2 tablets (40 mg total) by mouth daily with breakfast. (Patient not taking: Reported on 05/13/2018) 6 tablet 0   No facility-administered medications prior to visit.     ROS Review of Systems  Constitutional: Negative for activity change and appetite change.  HENT: Negative for sinus pressure and sore throat.   Eyes: Negative for visual disturbance.  Respiratory: Negative for cough, chest tightness and shortness of breath.   Cardiovascular: Negative for chest pain and leg swelling.  Gastrointestinal: Negative for abdominal distention, abdominal pain, constipation and diarrhea.  Endocrine: Negative.   Genitourinary: Negative for dysuria.  Musculoskeletal: Negative for joint swelling and myalgias.  Skin: Negative for rash.  Allergic/Immunologic: Negative.   Neurological: Negative for weakness,  light-headedness and numbness.  Psychiatric/Behavioral: Negative for dysphoric mood and suicidal ideas.    Objective:  BP (!) 173/88   Pulse 63   Temp 98.3 F (36.8 C) (Oral)   Ht 6\' 3"  (1.905 m)   Wt 208 lb 6.4 oz (94.5 kg)   SpO2 95%   BMI 26.05 kg/m   BP/Weight 05/15/2018 05/13/2018 05/12/2018  Systolic BP 173 157 -  Diastolic BP 88 85 -  Wt. (Lbs) 208.4 - 200  BMI 26.05 - 25.17      Physical Exam  Constitutional: He is oriented to person, place, and time. He appears well-developed and well-nourished.  HENT:  On 2 L of oxygen via nasal cannula  Cardiovascular: Normal rate, normal heart sounds and intact distal pulses.  No murmur heard. Pulmonary/Chest: Effort normal and breath sounds normal. He has no wheezes. He has no rales. He exhibits no tenderness.  Abdominal: Soft. Bowel sounds are normal. He exhibits no distension and no mass. There is no tenderness.  Musculoskeletal: Normal range of motion.  Neurological: He is alert and oriented to person, place, and time.  Skin: Skin is warm and dry.  Psychiatric: He has a normal mood and affect.     CMP Latest Ref Rng & Units 05/13/2018 05/12/2018 05/02/2018  Glucose 70 - 99 mg/dL 161(W) 90 960(A)  BUN 8 - 23 mg/dL 17 14 12   Creatinine 0.61 - 1.24 mg/dL 5.40 9.81 1.91  Sodium 135 - 145 mmol/L 139 143 140  Potassium 3.5 - 5.1 mmol/L 3.9 4.1 4.5  Chloride 98 - 111 mmol/L 97(L) 96(L) 104  CO2 22 - 32 mmol/L 32 38(H) 25  Calcium 8.9 - 10.3 mg/dL 4.7(W) 2.9(F) 9.0  Total Protein 6.5 - 8.1 g/dL - - 6.4(L)  Total Bilirubin 0.3 - 1.2 mg/dL - - 0.4  Alkaline Phos 38 - 126 U/L - - 57  AST 15 - 41 U/L - - 22  ALT 0 - 44 U/L - - 16     Assessment & Plan:   1. COPD GOLD III/still smoking  Acute exacerbation resolved Counseled on smoking cessation - albuterol (PROVENTIL HFA;VENTOLIN HFA) 108 (90 Base) MCG/ACT inhaler; Inhale 2 puffs into the lungs every 6 (six) hours as needed for wheezing or shortness of breath.  Dispense: 1  Inhaler; Refill: 5 - fluticasone furoate-vilanterol (BREO ELLIPTA) 100-25 MCG/INH AEPB; Inhale 1 puff into the lungs daily.  Dispense: 60 each; Refill: 6  2. Tremor Will need to exclude thyroid disorder Also possible he could be having reactions to the recently received Solu-Medrol during hospitalization We will observe - T4, free - TSH  3. Nonischemic dilated cardiomyopathy (HCC) EF 45 to 50% from echo of 04/2018 Euvolemic Continue beta-blocker, ARB, Lasix  4. Chronic respiratory failure with hypoxia (HCC) Currently on 2 L of oxygen  5. Atrial flutter, unspecified type (HCC) Continue anticoagulation with Xarelto - rivaroxaban (XARELTO) 20 MG TABS tablet; Take 1 tablet (20 mg total) by mouth daily with supper.  Dispense: 30 tablet; Refill: 6  6. Hypertensive heart disease with chronic combined systolic and diastolic congestive heart failure (HCC) C3 above - losartan (COZAAR) 50 MG tablet; Take 1 tablet (50 mg total) by mouth daily.  Dispense: 30 tablet; Refill: 6 - furosemide (LASIX) 40 MG tablet; Take 1 tablet (40 mg total) by mouth daily.  Dispense: 30 tablet; Refill: 0 - atorvastatin (LIPITOR) 80 MG tablet; Take 1 tablet (80 mg total) by mouth daily.  Dispense: 30 tablet; Refill: 6 - carvedilol (COREG) 3.125 MG tablet; Take 1 tablet (3.125 mg total) by mouth 2 (two) times daily.  Dispense: 60 tablet; Refill: 6   Meds ordered this encounter  Medications  . losartan (COZAAR) 50 MG tablet    Sig: Take 1 tablet (50 mg total) by mouth daily.    Dispense:  30 tablet    Refill:  6  . furosemide (LASIX) 40 MG tablet    Sig: Take 1 tablet (40 mg total) by mouth daily.    Dispense:  30 tablet    Refill:  0  . albuterol (PROVENTIL HFA;VENTOLIN HFA) 108 (90 Base) MCG/ACT inhaler    Sig: Inhale 2 puffs into the lungs every 6 (six) hours as needed for wheezing or shortness of breath.    Dispense:  1 Inhaler    Refill:  5  . atorvastatin (LIPITOR) 80 MG tablet    Sig: Take 1 tablet  (80 mg total) by mouth daily.    Dispense:  30 tablet    Refill:  6  . carvedilol (COREG) 3.125 MG tablet    Sig: Take 1 tablet (3.125 mg total)  by mouth 2 (two) times daily.    Dispense:  60 tablet    Refill:  6  . fluticasone furoate-vilanterol (BREO ELLIPTA) 100-25 MCG/INH AEPB    Sig: Inhale 1 puff into the lungs daily.    Dispense:  60 each    Refill:  6  . rivaroxaban (XARELTO) 20 MG TABS tablet    Sig: Take 1 tablet (20 mg total) by mouth daily with supper.    Dispense:  30 tablet    Refill:  6    Follow-up: Return in about 1 month (around 06/15/2018) for follow up on blood pressure.   [name] MD

## 2018-05-16 LAB — TSH: TSH: 1.23 u[IU]/mL (ref 0.450–4.500)

## 2018-05-16 LAB — T4, FREE: Free T4: 0.88 ng/dL (ref 0.82–1.77)

## 2018-05-16 MED ORDER — FUROSEMIDE 40 MG PO TABS: 40.0000 mg | ORAL_TABLET | Freq: Every day | ORAL | 3 refills | Status: DC

## 2018-05-16 MED FILL — FUROSEMIDE 40 MG TAB: 40 | 30 days supply | Qty: 30 | Fill #0

## 2018-05-19 ENCOUNTER — Telehealth: Payer: Self-pay

## 2018-05-19 NOTE — Telephone Encounter (Signed)
-----   Message from [name], MD sent at 05/16/2018  3:06 PM EST ----- Thyroid labs are normal

## 2018-05-19 NOTE — Telephone Encounter (Signed)
Patient was called and informed of lab results. 

## 2018-05-28 ENCOUNTER — Ambulatory Visit: Payer: Medicaid Other | Admitting: Family Medicine

## 2018-05-30 ENCOUNTER — Emergency Department (HOSPITAL_COMMUNITY): Payer: Medicaid Other

## 2018-05-30 ENCOUNTER — Emergency Department (HOSPITAL_COMMUNITY)
Admission: EM | Admit: 2018-05-30 | Discharge: 2018-05-30 | Disposition: A | Discharge status: Home / Self Care | Payer: Medicaid Other | Attending: Emergency Medicine | Admitting: Emergency Medicine

## 2018-05-30 ENCOUNTER — Encounter (HOSPITAL_COMMUNITY): Payer: Self-pay | Admitting: Emergency Medicine

## 2018-05-30 DIAGNOSIS — F17210 Nicotine dependence, cigarettes, uncomplicated: Secondary | ICD-10-CM | POA: Diagnosis not present

## 2018-05-30 DIAGNOSIS — Z79899 Other long term (current) drug therapy: Secondary | ICD-10-CM | POA: Insufficient documentation

## 2018-05-30 DIAGNOSIS — I2510 Atherosclerotic heart disease of native coronary artery without angina pectoris: Secondary | ICD-10-CM | POA: Insufficient documentation

## 2018-05-30 DIAGNOSIS — I5042 Chronic combined systolic (congestive) and diastolic (congestive) heart failure: Secondary | ICD-10-CM | POA: Diagnosis not present

## 2018-05-30 DIAGNOSIS — I110 Hypertensive heart disease with heart failure: Secondary | ICD-10-CM | POA: Diagnosis not present

## 2018-05-30 DIAGNOSIS — Z7901 Long term (current) use of anticoagulants: Secondary | ICD-10-CM | POA: Diagnosis not present

## 2018-05-30 DIAGNOSIS — J441 Chronic obstructive pulmonary disease with (acute) exacerbation: Secondary | ICD-10-CM

## 2018-05-30 DIAGNOSIS — R0602 Shortness of breath: Secondary | ICD-10-CM | POA: Diagnosis present

## 2018-05-30 LAB — I-STAT TROPONIN, ED: Troponin i, poc: 0.03 ng/mL (ref 0.00–0.08)

## 2018-05-30 LAB — BASIC METABOLIC PANEL
Anion gap: 11 (ref 5–15)
BUN: 13 mg/dL (ref 8–23)
CO2: 35 mmol/L — ABNORMAL HIGH (ref 22–32)
Calcium: 8.7 mg/dL — ABNORMAL LOW (ref 8.9–10.3)
Chloride: 98 mmol/L (ref 98–111)
Creatinine, Ser: 0.95 mg/dL (ref 0.61–1.24)
GFR calc Af Amer: >60 mL/min (ref >60)
GFR calc non Af Amer: >60 mL/min (ref >60)
Glucose, Bld: 111 mg/dL — ABNORMAL HIGH (ref 70–99)
Potassium: 3.9 mmol/L (ref 3.5–5.1)
Sodium: 144 mmol/L (ref 135–145)

## 2018-05-30 LAB — CBC
HCT: 38.8 % — ABNORMAL LOW (ref 39.0–52.0)
Hemoglobin: 11.3 g/dL — ABNORMAL LOW (ref 13.0–17.0)
MCH: 28.4 pg (ref 26.0–34.0)
MCHC: 29.1 g/dL — ABNORMAL LOW (ref 30.0–36.0)
MCV: 97.5 fL (ref 80.0–100.0)
Platelets: 186 10*3/uL (ref 150–400)
RBC: 3.98 MIL/uL — ABNORMAL LOW (ref 4.22–5.81)
RDW: 13.7 % (ref 11.5–15.5)
WBC: 10.9 10*3/uL — ABNORMAL HIGH (ref 4.0–10.5)
nRBC: 0 % (ref 0.0–0.2)

## 2018-05-30 LAB — BRAIN NATRIURETIC PEPTIDE: B Natriuretic Peptide: 187.5 pg/mL — ABNORMAL HIGH (ref 0.0–100.0)

## 2018-05-30 MED ORDER — ALBUTEROL SULFATE (2.5 MG/3ML) 0.083% IN NEBU
2.5000 mg | INHALATION_SOLUTION | Freq: Once | RESPIRATORY_TRACT | Status: AC
  Administered 2018-05-30: 2.5 mg via RESPIRATORY_TRACT
  Filled 2018-05-30: qty 3

## 2018-05-30 MED ORDER — IPRATROPIUM-ALBUTEROL 0.5-2.5 (3) MG/3ML IN SOLN: 3.0000 mL | Freq: Once | RESPIRATORY_TRACT | Status: DC

## 2018-05-30 MED ORDER — PREDNISONE 20 MG PO TABS: 40.0000 mg | ORAL_TABLET | Freq: Every day | ORAL | 0 refills | Status: DC

## 2018-05-30 NOTE — ED Notes (Signed)
Pt ambulated with pulse ox. Sats remained mid 90's during ambulation with 3L [place]. Pt reports feeling SOB. EDP is aware and at bedside again.

## 2018-05-30 NOTE — Discharge Instructions (Addendum)
Return if your symptoms do not improve over the next or two.  Otherwise, you can follow-up with  your doctor.  Return to the ED for new or worsening symptoms.

## 2018-05-30 NOTE — ED Notes (Signed)
BIB EMS from home, pt reports SOB X1 week, worsening tonight. Also reports productive cough. Diminished lung sound with mild exp wheezing upon EMS arrival. Given 10 Albuterol, 1 Atrovent, 125 SoluMedrol en route. Pt wears 3L [place] chronically.

## 2018-05-30 NOTE — ED Provider Notes (Signed)
[facility] EMERGENCY DEPARTMENT Provider Note   CSN: [id number] Arrival date & time: 05/30/18  0346     History   Chief Complaint Chief Complaint  Patient presents with  . Shortness of Breath    HPI Jared [name] is a 62 y.o. male.  Patient with past medical history remarkable for CAD, COPD, CHF, on 3 L home O2 24/7, presents to the emergency department with a chief complaint of shortness of breath.  He states his symptoms started 2 days ago.  States that yesterday his symptoms gradually worsened.  He states that tonight he reached a point where he had to come be evaluated.  He reports some cough, but denies any productive cough.  Denies any fevers or chills.  Denies any new lower extremity swelling.  The history is provided by the patient. No language interpreter was used.    Past Medical History:  Diagnosis Date  . CAD (coronary artery disease)    a. LHC 5/12:  LAD 20, pLCx 20, pRCA 40, dRCA 40, EF 35%, diff HK  //  b. Myoview 4/16: Overall Impression: High risk stress nuclear study There is no evidence of ischemia. There is severe LV dysfunction. LV Ejection Fraction: 30%. LV Wall Motion: There is global LV hypokinesis.   Marland Kitchen CAP (community acquired pneumonia) 09/2013  . Chronic combined systolic and diastolic CHF (congestive heart failure) (HCC)    a. Echo 4/16:Mild LVH, EF 40-45%, diffuse HK //  b. Echo 8/17: EF 35-40%, diffuse HK, diastolic dysfunction, aortic sclerosis, trivial MR, moderate LAE, normal RVSF, moderate RAE, mild TR, PASP 42 mmHg // c. Echo 4/18: Mild concentric LVH, EF 30-35, normal wall motion, grade 1 diastolic dysfunction, PASP 49  . Cluster headache    "hx; haven't had one in awhile" (01/09/2016)  . COPD (chronic obstructive pulmonary disease) (HCC)    [name] 01/09/2016  . History of CVA (cerebrovascular accident)   . Hypertension   . NICM (nonischemic cardiomyopathy) (HCC)   . Tobacco abuse     Patient Active Problem List   Diagnosis Date Noted  . Noncompliance 06/14/2017  . Elevated LFTs 08/13/2016  . Chronic respiratory failure with hypoxia (HCC) 07/24/2016  . Encounter for therapeutic drug monitoring 07/11/2016  . Atrial flutter (HCC)   . Hepatic congestion 07/02/2016  . Chronic combined systolic and diastolic CHF (congestive heart failure) (HCC) 07/01/2016  . COPD exacerbation (HCC) 06/03/2016  . Prediabetes 05/14/2016  . Hemoptysis 05/06/2016  . COPD GOLD III/still smoking  02/29/2016  . Cigarette smoker 01/09/2016  . CAD (coronary artery disease) 10/07/2013  . 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 Centrilobular emphysema (HCC) 10/04/2013  . Pulmonary infiltrate in right lung on CXR 10/03/2013  . Acute on chronic respiratory failure with hypoxia (HCC) 10/03/2013  . Essential hypertension     Past Surgical History:  Procedure Laterality Date  . CARDIAC CATHETERIZATION  10/2010   LM normal, LAD with 20% irregularities, LCX with 20%, RCA with 40% prox and 40% distal - EF of 35%  . COLONOSCOPY W/ BIOPSIES AND POLYPECTOMY    . EXCISION MASS HEAD    . INCISION AND DRAINAGE PERIRECTAL ABSCESS N/A 06/05/2017   Procedure: IRRIGATION AND DEBRIDEMENT PERIRECTAL ABSCESS;  Surgeon: Andria Meuse, MD;  Location: [facility] OR;  Service: General;  Laterality: N/A;  . VIDEO BRONCHOSCOPY Bilateral 05/08/2016   Procedure: VIDEO BRONCHOSCOPY WITH FLUORO;  Surgeon: Oretha Milch, MD;  Location: [facility] ENDOSCOPY;  Service: Cardiopulmonary;  Laterality: Bilateral;  Home Medications    Prior to Admission medications   Medication Sig Start Date End Date Taking? Authorizing Provider  albuterol (PROVENTIL HFA;VENTOLIN HFA) 108 (90 Base) MCG/ACT inhaler Inhale 2 puffs into the lungs every 6 (six) hours as needed for wheezing or shortness of breath. 05/15/18   Hoy RegisterNewlin, Enobong, MD  atorvastatin (LIPITOR) 80 MG tablet Take 1 tablet (80 mg total) by mouth daily. 05/15/18   Hoy RegisterNewlin, Enobong, MD  carvedilol  (COREG) 3.125 MG tablet Take 1 tablet (3.125 mg total) by mouth 2 (two) times daily. 05/15/18 05/15/19  Hoy RegisterNewlin, Enobong, MD  docusate sodium (COLACE) 100 MG capsule Take 1 capsule (100 mg total) by mouth every 12 (twelve) hours. Patient not taking: Reported on 03/30/2018 06/02/17   Roxy HorsemanBrowning, Adonai Helzer, PA-C  fluticasone furoate-vilanterol (BREO ELLIPTA) 100-25 MCG/INH AEPB Inhale 1 puff into the lungs daily. 05/15/18   Hoy RegisterNewlin, Enobong, MD  furosemide (LASIX) 40 MG tablet Take 1 tablet (40 mg total) by mouth daily. 05/16/18 05/16/19  Hoy RegisterNewlin, Enobong, MD  hyoscyamine (LEVSIN SL) 0.125 MG SL tablet Place 1 tablet (0.125 mg total) under the tongue every 8 (eight) hours as needed. Patient not taking: Reported on 03/30/2018 08/08/16   Zehr, Princella PellegriniJessica D, PA-C  losartan (COZAAR) 50 MG tablet Take 1 tablet (50 mg total) by mouth daily. 05/15/18   Hoy RegisterNewlin, Enobong, MD  OXYGEN Place 2 L into the nose daily. 2lpm when needed per pt    [provider]  polyethylene glycol powder (GLYCOLAX/MIRALAX) powder Take 17 g by mouth 2 (two) times daily. Until daily soft stools  OTC Patient not taking: Reported on 03/30/2018 06/02/17   Roxy HorsemanBrowning, Drevin Ortner, PA-C  rivaroxaban (XARELTO) 20 MG TABS tablet Take 1 tablet (20 mg total) by mouth daily with supper. 05/15/18   Hoy RegisterNewlin, Enobong, MD    Family History Family History  Problem Relation Age of Onset  . Heart disease Mother   . Diabetes Mother   . Colon cancer Mother   . Liver cancer Mother   . Cancer Father        type unknown  . Diabetes Sister        x 2  . Diabetes Brother     Social History Social History   Tobacco Use  . Smoking status: Current Every Day Smoker    Packs/day: 0.25    Years: 42.00    Pack years: 10.50    Types: Cigarettes  . Smokeless tobacco: Never Used  . Tobacco comment: 3 cigs daily  Substance Use Topics  . Alcohol use: Yes    Alcohol/week: 0.0 standard drinks    Comment: last drink was before xmas  . Drug use: No    Types:  Cocaine, Marijuana    Comment: "nothing in 20 years"     Allergies   Lisinopril   Review of Systems Review of Systems  All other systems reviewed and are negative.    Physical Exam Updated Vital Signs BP 129/83 (BP Location: Right Arm)   Pulse 99   Temp 98 F (36.7 C) (Oral)   Resp 20   Ht 6\' 3"  (1.905 m)   Wt 90.7 kg   SpO2 94%   BMI 25.00 kg/m   Physical Exam Vitals signs and nursing note reviewed.  Constitutional:      Appearance: He is well-developed.  HENT:     Head: Normocephalic and atraumatic.  Eyes:     General: No scleral icterus.       Right eye: No discharge.  Left eye: No discharge.     Conjunctiva/sclera: Conjunctivae normal.     Pupils: Pupils are equal, round, and reactive to light.  Neck:     Musculoskeletal: Normal range of motion and neck supple.     Vascular: No JVD.  Cardiovascular:     Rate and Rhythm: Normal rate and regular rhythm.     Heart sounds: Normal heart sounds. No murmur. No friction rub. No gallop.   Pulmonary:     Effort: Pulmonary effort is normal. No respiratory distress.     Breath sounds: No wheezing, rhonchi or rales.     Comments: Diminished lung sounds bilaterally, no wheezing, rales, rhonchi Chest:     Chest wall: No tenderness.  Abdominal:     General: There is no distension.     Palpations: Abdomen is soft. There is no mass.     Tenderness: There is no abdominal tenderness. There is no guarding or rebound.  Musculoskeletal: Normal range of motion.        General: No tenderness.     Comments: No pitting edema  Skin:    General: Skin is warm and dry.  Neurological:     Mental Status: He is alert and oriented to person, place, and time.  Psychiatric:        Behavior: Behavior normal.        Thought Content: Thought content normal.        Judgment: Judgment normal.      ED Treatments / Results  Labs (all labs ordered are listed, but only abnormal results are displayed) Labs Reviewed  CBC -  Abnormal; Notable for the following components:      Result Value   WBC 10.9 (*)    RBC 3.98 (*)    Hemoglobin 11.3 (*)    HCT 38.8 (*)    MCHC 29.1 (*)    All other components within normal limits  BASIC METABOLIC PANEL  BRAIN NATRIURETIC PEPTIDE  I-STAT TROPONIN, ED    EKG EKG Interpretation  Date/Time:  Friday May 30 2018 03:50:45 EST Ventricular Rate:  97 PR Interval:    QRS Duration: 98 QT Interval:  327 QTC Calculation: 416 R Axis:   -78 Text Interpretation:  Sinus rhythm Ventricular premature complex Left anterior fascicular block Borderline low voltage, extremity leads Minimal ST elevation, anterior leads When compared with ECG of 05/12/2018, No significant change was found Confirmed by Dione Booze ([id number]) on 05/30/2018 3:57:11 AM   Radiology Dg Chest Portable 1 View  Result Date: 05/30/2018 CLINICAL DATA:  Shortness of breath EXAM: PORTABLE CHEST 1 VIEW COMPARISON:  05/13/2018 CTA chest FINDINGS: The lungs are hyperinflated with diffuse interstitial prominence. No focal airspace consolidation or pulmonary edema. No pleural effusion or pneumothorax. Normal cardiomediastinal contours. IMPRESSION: COPD without focal airspace disease. Electronically Signed   By: Deatra Robinson M.D.   On: 05/30/2018 04:27    Procedures Procedures (including critical care time)  Medications Ordered in ED Medications  ipratropium-albuterol (DUONEB) 0.5-2.5 (3) MG/3ML nebulizer solution 3 mL (3 mLs Nebulization Not Given 05/30/18 0502)  albuterol (PROVENTIL) (2.5 MG/3ML) 0.083% nebulizer solution 2.5 mg (2.5 mg Nebulization Given 05/30/18 0549)     Initial Impression / Assessment and Plan / ED Course  I have reviewed the triage vital signs and the nursing notes.  Pertinent labs & imaging results that were available during my care of the patient were reviewed by me and considered in my medical decision making (see chart for details).  Patient with shortness of breath x1 week,  worsened tonight.  Reports chronic productive cough.  Denies any fevers.  Lung sounds are diminished.    Troponin is negative, patient denies any chest pain.  EKG is unchanged from priors.  Laboratory work-up is reassuring.  Patient is in no acute distress.  His vital signs are stable.  Feels improved after breathing treatment in the ED.  Solu-Medrol and nebs were given by EMS prior to arrival.  He is on 3 L nasal cannula.  He is in no acute distress.  He ambulates maintaining greater than 90% pulse oxygenation.  Believe him to be stable for outpatient follow-up.  Will prescribe prednisone.  Patient understands and agrees with the plan.  Return precautions given.  He is stable and ready for discharge.  Final Clinical Impressions(s) / ED Diagnoses   Final diagnoses:  COPD exacerbation [facility])    ED Discharge Orders         Ordered    predniSONE (DELTASONE) 20 MG tablet  Daily     05/30/18 0551           Roxy Horseman, PA-C 05/30/18 0556    Dione Booze, MD 05/30/18 [phone]

## 2018-05-30 NOTE — ED Notes (Signed)
[facility] called  

## 2018-05-30 NOTE — ED Notes (Signed)
EDP at bedside  

## 2018-06-18 ENCOUNTER — Ambulatory Visit: Payer: Medicaid Other | Attending: Family Medicine | Admitting: Family Medicine

## 2018-06-18 ENCOUNTER — Encounter: Payer: Self-pay | Admitting: Family Medicine

## 2018-06-18 VITALS — BP 131/83 | HR 83 | Temp 98.6°F | Resp 16 | Wt 212.6 lb

## 2018-06-18 DIAGNOSIS — Z8673 Personal history of transient ischemic attack (TIA), and cerebral infarction without residual deficits: Secondary | ICD-10-CM | POA: Insufficient documentation

## 2018-06-18 DIAGNOSIS — J9611 Chronic respiratory failure with hypoxia: Secondary | ICD-10-CM | POA: Diagnosis not present

## 2018-06-18 DIAGNOSIS — J441 Chronic obstructive pulmonary disease with (acute) exacerbation: Secondary | ICD-10-CM | POA: Diagnosis not present

## 2018-06-18 DIAGNOSIS — G629 Polyneuropathy, unspecified: Secondary | ICD-10-CM | POA: Insufficient documentation

## 2018-06-18 DIAGNOSIS — R251 Tremor, unspecified: Secondary | ICD-10-CM | POA: Diagnosis not present

## 2018-06-18 DIAGNOSIS — I2510 Atherosclerotic heart disease of native coronary artery without angina pectoris: Secondary | ICD-10-CM | POA: Diagnosis not present

## 2018-06-18 DIAGNOSIS — Z888 Allergy status to other drugs, medicaments and biological substances status: Secondary | ICD-10-CM | POA: Diagnosis not present

## 2018-06-18 DIAGNOSIS — R7303 Prediabetes: Secondary | ICD-10-CM | POA: Insufficient documentation

## 2018-06-18 DIAGNOSIS — I4892 Unspecified atrial flutter: Secondary | ICD-10-CM | POA: Diagnosis not present

## 2018-06-18 DIAGNOSIS — I110 Hypertensive heart disease with heart failure: Secondary | ICD-10-CM | POA: Insufficient documentation

## 2018-06-18 DIAGNOSIS — J432 Centrilobular emphysema: Secondary | ICD-10-CM | POA: Diagnosis not present

## 2018-06-18 DIAGNOSIS — Z7901 Long term (current) use of anticoagulants: Secondary | ICD-10-CM | POA: Insufficient documentation

## 2018-06-18 DIAGNOSIS — I5042 Chronic combined systolic (congestive) and diastolic (congestive) heart failure: Secondary | ICD-10-CM | POA: Diagnosis not present

## 2018-06-18 DIAGNOSIS — Z79899 Other long term (current) drug therapy: Secondary | ICD-10-CM | POA: Diagnosis not present

## 2018-06-18 MED ORDER — CETIRIZINE HCL 10 MG PO TABS: 10.0000 mg | ORAL_TABLET | Freq: Every day | ORAL | 1 refills | Status: DC

## 2018-06-18 MED ORDER — GABAPENTIN 300 MG PO CAPS: 300.0000 mg | ORAL_CAPSULE | Freq: Every day | ORAL | 3 refills | Status: DC

## 2018-06-18 MED FILL — GABAPENTIN 300 MG CAPSULE: 300 | 30 days supply | Qty: 30 | Fill #0

## 2018-06-18 NOTE — Progress Notes (Signed)
Subjective:  Patient ID: Jared [name], male    DOB: [birth year]  Age: 63 y.o. MRN: [id number]  CC: Follow-up (1 month )   HPI Aceon Spear is a 63 year old male with a history of hypertension, COPD, tobacco abuse, NICM , CHF (EF 45-50% from echo 04/2018  improved from  30-35% in 09/2016), atrial flutter here for follow-up on tremor. At his last visit this was thought to be related to steroids he received during hospitalization and he endorses ongoing tremor and sometimes has to use his contralateral hand to support his right hand when he is holding his phone. He denies recent falls. Of note he is currently on prednisone which was prescribed during an ED visit for COPD exacerbation a little over 2 weeks ago. He also complains of feeling something stuck in his throat but denies worsening dyspnea, wheezing or chest pain. Has also noticed numbness in his bilateral lower extremity; he has prediabetes with his last A1c of 6.3 two years ago.  Past Medical History:  Diagnosis Date  . CAD (coronary artery disease)    a. LHC 5/12:  LAD 20, pLCx 20, pRCA 40, dRCA 40, EF 35%, diff HK  //  b. Myoview 4/16: Overall Impression: High risk stress nuclear study There is no evidence of ischemia. There is severe LV dysfunction. LV Ejection Fraction: 30%. LV Wall Motion: There is global LV hypokinesis.   Marland Kitchen CAP (community acquired pneumonia) 09/2013  . Chronic combined systolic and diastolic CHF (congestive heart failure) (HCC)    a. Echo 4/16:Mild LVH, EF 40-45%, diffuse HK //  b. Echo 8/17: EF 35-40%, diffuse HK, diastolic dysfunction, aortic sclerosis, trivial MR, moderate LAE, normal RVSF, moderate RAE, mild TR, PASP 42 mmHg // c. Echo 4/18: Mild concentric LVH, EF 30-35, normal wall motion, grade 1 diastolic dysfunction, PASP 49  . Cluster headache    "hx; haven't had one in awhile" (01/09/2016)  . COPD (chronic obstructive pulmonary disease) (HCC)    [name] 01/09/2016  . History of CVA (cerebrovascular  accident)   . Hypertension   . NICM (nonischemic cardiomyopathy) (HCC)   . Tobacco abuse     Past Surgical History:  Procedure Laterality Date  . CARDIAC CATHETERIZATION  10/2010   LM normal, LAD with 20% irregularities, LCX with 20%, RCA with 40% prox and 40% distal - EF of 35%  . COLONOSCOPY W/ BIOPSIES AND POLYPECTOMY    . EXCISION MASS HEAD    . INCISION AND DRAINAGE PERIRECTAL ABSCESS N/A 06/05/2017   Procedure: IRRIGATION AND DEBRIDEMENT PERIRECTAL ABSCESS;  Surgeon: Andria Meuse, MD;  Location: [facility] OR;  Service: General;  Laterality: N/A;  . VIDEO BRONCHOSCOPY Bilateral 05/08/2016   Procedure: VIDEO BRONCHOSCOPY WITH FLUORO;  Surgeon: Oretha Milch, MD;  Location: [facility];  Service: Cardiopulmonary;  Laterality: Bilateral;    Allergies  Allergen Reactions  . Lisinopril Cough     Outpatient Medications Prior to Visit  Medication Sig Dispense Refill  . albuterol (PROVENTIL HFA;VENTOLIN HFA) 108 (90 Base) MCG/ACT inhaler Inhale 2 puffs into the lungs every 6 (six) hours as needed for wheezing or shortness of breath. 1 Inhaler 5  . atorvastatin (LIPITOR) 80 MG tablet Take 1 tablet (80 mg total) by mouth daily. 30 tablet 6  . carvedilol (COREG) 3.125 MG tablet Take 1 tablet (3.125 mg total) by mouth 2 (two) times daily. 60 tablet 6  . docusate sodium (COLACE) 100 MG capsule Take 1 capsule (100 mg total) by mouth every 12 (twelve) hours. (Patient  not taking: Reported on 03/30/2018) 30 capsule 0  . fluticasone furoate-vilanterol (BREO ELLIPTA) 100-25 MCG/INH AEPB Inhale 1 puff into the lungs daily. 60 each 6  . furosemide (LASIX) 40 MG tablet Take 1 tablet (40 mg total) by mouth daily. 30 tablet 3  . hyoscyamine (LEVSIN SL) 0.125 MG SL tablet Place 1 tablet (0.125 mg total) under the tongue every 8 (eight) hours as needed. (Patient not taking: Reported on 03/30/2018) 30 tablet 1  . losartan (COZAAR) 50 MG tablet Take 1 tablet (50 mg total) by mouth daily. 30 tablet 6  .  OXYGEN Place 2 L into the nose daily. 2lpm when needed per pt    . polyethylene glycol powder (GLYCOLAX/MIRALAX) powder Take 17 g by mouth 2 (two) times daily. Until daily soft stools  OTC (Patient not taking: Reported on 03/30/2018) 250 g 0  . predniSONE (DELTASONE) 20 MG tablet Take 2 tablets (40 mg total) by mouth daily. Take 40 mg by mouth daily for 3 days, then 20mg  by mouth daily for 3 days, then 10mg  daily for 3 days 12 tablet 0  . rivaroxaban (XARELTO) 20 MG TABS tablet Take 1 tablet (20 mg total) by mouth daily with supper. 30 tablet 6   No facility-administered medications prior to visit.     ROS Review of Systems  Constitutional: Negative for activity change and appetite change.  HENT: Negative for sinus pressure and sore throat.   Eyes: Negative for visual disturbance.  Respiratory: Negative for cough, chest tightness and shortness of breath.   Cardiovascular: Negative for chest pain and leg swelling.  Gastrointestinal: Negative for abdominal distention, abdominal pain, constipation and diarrhea.  Endocrine: Negative.   Genitourinary: Negative for dysuria.  Musculoskeletal: Negative for joint swelling and myalgias.  Skin: Negative for rash.  Allergic/Immunologic: Negative.   Neurological: Positive for tremors and numbness. Negative for weakness and light-headedness.  Psychiatric/Behavioral: Negative for dysphoric mood and suicidal ideas.    Objective:  BP 131/83   Pulse 83   Temp 98.6 F (37 C) (Oral)   Resp 16   Wt 212 lb 9.6 oz (96.4 kg)   SpO2 99%   BMI 26.57 kg/m   BP/Weight 06/18/2018 05/30/2018 05/15/2018  Systolic BP 131 131 173  Diastolic BP 83 91 88  Wt. (Lbs) 212.6 200 208.4  BMI 26.57 25 26.05      Physical Exam Constitutional:      Appearance: He is well-developed.  HENT:     Head:     Comments: On 2 L of oxygen at rest Cardiovascular:     Rate and Rhythm: Normal rate.     Heart sounds: Normal heart sounds. No murmur.  Pulmonary:      Effort: Pulmonary effort is normal.     Breath sounds: Normal breath sounds. No wheezing or rales.  Chest:     Chest wall: No tenderness.  Abdominal:     General: Bowel sounds are normal. There is no distension.     Palpations: Abdomen is soft. There is no mass.     Tenderness: There is no abdominal tenderness.  Musculoskeletal: Normal range of motion.  Neurological:     Mental Status: He is alert and oriented to person, place, and time.     Comments: Tremor of outstretched hand Dysesthesia on palpation of bilateral lower extremities      Assessment & Plan:   1. Chronic respiratory failure with hypoxia (HCC) Continue 2 L of oxygen  2. Centrilobular emphysema (HCC) Recovering from recent  exacerbation Continue prednisone  3. Tremor Unknown etiology Thyroid was normal If symptoms persist will refer to neurology  4. Neuropathy Commenced on gabapentin-discussed sedation side effects Consider A1c at next visit - Vitamin B12   Meds ordered this encounter  Medications  . cetirizine (ZYRTEC) 10 MG tablet    Sig: Take 1 tablet (10 mg total) by mouth daily.    Dispense:  30 tablet    Refill:  1  . gabapentin (NEURONTIN) 300 MG capsule    Sig: Take 1 capsule (300 mg total) by mouth at bedtime.    Dispense:  30 capsule    Refill:  3    Follow-up: Return in about 3 months (around 09/17/2018) for Follow-up of chronic medical conditions.   [name] MD

## 2018-06-19 ENCOUNTER — Telehealth: Payer: Self-pay

## 2018-06-19 LAB — VITAMIN B12: Vitamin B-12: 988 pg/mL (ref 232–1245)

## 2018-06-19 MED FILL — ATORVASTATIN 80 MG TABLET: 80 | 30 days supply | Qty: 30 | Fill #0

## 2018-06-19 MED FILL — LOSARTAN POTASSIUM 50 MG TA: 50 | 30 days supply | Qty: 30 | Fill #0

## 2018-06-19 MED FILL — FUROSEMIDE 40 MG TAB: 40 | 30 days supply | Qty: 30 | Fill #1

## 2018-06-19 MED FILL — !VENTOLIN HFA INHALER: 108 (90 BAS | 25 days supply | Qty: 18 | Fill #1

## 2018-06-19 MED FILL — CARVEDILOL 3.125 MG TABLET: 3.125 | 30 days supply | Qty: 60 | Fill #0

## 2018-06-19 MED FILL — XARELTO 20 MG TABLET: 20 | 30 days supply | Qty: 30 | Fill #0

## 2018-06-19 MED FILL — BREO ELLIPTA 100-25 MCG INH: 100-25 | 30 days supply | Qty: 60 | Fill #0

## 2018-06-19 NOTE — Telephone Encounter (Signed)
-----   Message from [name], MD sent at 06/19/2018  2:30 PM EST ----- Please inform the patient that labs are normal. Thank you.

## 2018-06-19 NOTE — Telephone Encounter (Signed)
Patient was called and informed of lab results. 

## 2018-07-21 MED FILL — GABAPENTIN 300 MG CAPSULE: 300 | 30 days supply | Qty: 30 | Fill #1

## 2018-07-21 MED FILL — LOSARTAN POTASSIUM 50 MG TA: 50 | 30 days supply | Qty: 30 | Fill #1

## 2018-07-21 MED FILL — !VENTOLIN HFA INHALER: 108 (90 BAS | 25 days supply | Qty: 18 | Fill #2

## 2018-07-21 MED FILL — XARELTO 20 MG TABLET: 20 | 30 days supply | Qty: 30 | Fill #1

## 2018-07-21 MED FILL — FUROSEMIDE 40 MG TAB: 40 | 30 days supply | Qty: 30 | Fill #0

## 2018-07-21 MED FILL — !BREO ELLIPTA 100-25 MCG IN: 100-25 | 30 days supply | Qty: 60 | Fill #1

## 2018-07-21 MED FILL — ATORVASTATIN 80 MG TABLET: 80 | 30 days supply | Qty: 30 | Fill #1

## 2018-07-21 MED FILL — CARVEDILOL 3.125 MG TABLET: 3.125 | 30 days supply | Qty: 60 | Fill #1

## 2018-07-29 ENCOUNTER — Other Ambulatory Visit: Payer: Self-pay

## 2018-07-29 ENCOUNTER — Encounter (HOSPITAL_COMMUNITY): Payer: Self-pay | Admitting: Emergency Medicine

## 2018-07-29 ENCOUNTER — Emergency Department (HOSPITAL_COMMUNITY)
Admission: EM | Admit: 2018-07-29 | Discharge: 2018-07-30 | Disposition: A | Discharge status: Home / Self Care | Payer: Medicaid Other | Attending: Emergency Medicine | Admitting: Emergency Medicine

## 2018-07-29 DIAGNOSIS — F17210 Nicotine dependence, cigarettes, uncomplicated: Secondary | ICD-10-CM | POA: Diagnosis not present

## 2018-07-29 DIAGNOSIS — I5042 Chronic combined systolic (congestive) and diastolic (congestive) heart failure: Secondary | ICD-10-CM | POA: Insufficient documentation

## 2018-07-29 DIAGNOSIS — I2510 Atherosclerotic heart disease of native coronary artery without angina pectoris: Secondary | ICD-10-CM | POA: Diagnosis not present

## 2018-07-29 DIAGNOSIS — Z7901 Long term (current) use of anticoagulants: Secondary | ICD-10-CM | POA: Diagnosis not present

## 2018-07-29 DIAGNOSIS — R0602 Shortness of breath: Secondary | ICD-10-CM | POA: Insufficient documentation

## 2018-07-29 DIAGNOSIS — R220 Localized swelling, mass and lump, head: Secondary | ICD-10-CM | POA: Diagnosis present

## 2018-07-29 DIAGNOSIS — J449 Chronic obstructive pulmonary disease, unspecified: Secondary | ICD-10-CM | POA: Insufficient documentation

## 2018-07-29 DIAGNOSIS — Z79899 Other long term (current) drug therapy: Secondary | ICD-10-CM | POA: Diagnosis not present

## 2018-07-29 DIAGNOSIS — I110 Hypertensive heart disease with heart failure: Secondary | ICD-10-CM | POA: Insufficient documentation

## 2018-07-29 DIAGNOSIS — K1120 Sialoadenitis, unspecified: Secondary | ICD-10-CM | POA: Diagnosis not present

## 2018-07-29 NOTE — ED Triage Notes (Signed)
Pt. Stated, I woke up this morning and I SOB more than usual and my left side of my jaw was swollen, Denies any bad teeth or bad gums.

## 2018-07-30 MED ORDER — AMOXICILLIN-POT CLAVULANATE 875-125 MG PO TABS
1.0000 | ORAL_TABLET | Freq: Once | ORAL | Status: AC
  Administered 2018-07-30: 1 via ORAL
  Filled 2018-07-30: qty 1

## 2018-07-30 MED ORDER — AMOXICILLIN-POT CLAVULANATE 875-125 MG PO TABS: 1.0000 | ORAL_TABLET | Freq: Two times a day (BID) | ORAL | 0 refills | Status: DC

## 2018-07-30 MED FILL — AMOX-CLAV 875-125 MG TABLET: 875-125 | 7 days supply | Qty: 14 | Fill #0

## 2018-07-30 NOTE — ED Provider Notes (Signed)
[facility] EMERGENCY DEPARTMENT Provider Note   CSN: [id number] Arrival date & time: 07/29/18  1736    History   Chief Complaint Chief Complaint  Patient presents with  . Shortness of Breath  . Jaw Pain  . Facial Swelling    HPI Jared [name] is a 63 y.o. male.     The history is provided by the patient.  Illness  Location:  Left face/ear Severity:  Moderate Onset quality:  Gradual Timing:  Constant Progression:  Improving Chronicity:  New Relieved by:  Nothing Worsened by:  Nothing Associated symptoms: shortness of breath   Associated symptoms: no abdominal pain, no chest pain, no fever and no vomiting   Patient with history of CAD, COPD on home oxygen of 3 L. He reports he woke up with left-sided facial swelling.  Denies any dental pain.  Denies any tongue swelling or difficulty swallowing.  Most of the swelling was near his left ear and down his jaw.  It has improved over time.  He also reports mild shortness of breath, but that is improving as well. Denies fever/vomiting.  No active chest pain.  No hemoptysis.  Past Medical History:  Diagnosis Date  . CAD (coronary artery disease)    a. LHC 5/12:  LAD 20, pLCx 20, pRCA 40, dRCA 40, EF 35%, diff HK  //  b. Myoview 4/16: Overall Impression: High risk stress nuclear study There is no evidence of ischemia. There is severe LV dysfunction. LV Ejection Fraction: 30%. LV Wall Motion: There is global LV hypokinesis.   Marland Kitchen CAP (community acquired pneumonia) 09/2013  . Chronic combined systolic and diastolic CHF (congestive heart failure) (HCC)    a. Echo 4/16:Mild LVH, EF 40-45%, diffuse HK //  b. Echo 8/17: EF 35-40%, diffuse HK, diastolic dysfunction, aortic sclerosis, trivial MR, moderate LAE, normal RVSF, moderate RAE, mild TR, PASP 42 mmHg // c. Echo 4/18: Mild concentric LVH, EF 30-35, normal wall motion, grade 1 diastolic dysfunction, PASP 49  . Cluster headache    "hx; haven't had one in awhile"  (01/09/2016)  . COPD (chronic obstructive pulmonary disease) (HCC)    [name] 01/09/2016  . History of CVA (cerebrovascular accident)   . Hypertension   . NICM (nonischemic cardiomyopathy) (HCC)   . Tobacco abuse     Patient Active Problem List   Diagnosis Date Noted  . Noncompliance 06/14/2017  . Elevated LFTs 08/13/2016  . Chronic respiratory failure with hypoxia (HCC) 07/24/2016  . Atrial flutter (HCC)   . Hepatic congestion 07/02/2016  . Chronic combined systolic and diastolic CHF (congestive heart failure) (HCC) 07/01/2016  . COPD exacerbation (HCC) 06/03/2016  . Prediabetes 05/14/2016  . Hemoptysis 05/06/2016  . COPD GOLD III/still smoking  02/29/2016  . Cigarette smoker 01/09/2016  . CAD (coronary artery disease) 10/07/2013  . 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 Centrilobular emphysema (HCC) 10/04/2013  . Pulmonary infiltrate in right lung on CXR 10/03/2013  . Acute on chronic respiratory failure with hypoxia (HCC) 10/03/2013  . Essential hypertension     Past Surgical History:  Procedure Laterality Date  . CARDIAC CATHETERIZATION  10/2010   LM normal, LAD with 20% irregularities, LCX with 20%, RCA with 40% prox and 40% distal - EF of 35%  . COLONOSCOPY W/ BIOPSIES AND POLYPECTOMY    . EXCISION MASS HEAD    . INCISION AND DRAINAGE PERIRECTAL ABSCESS N/A 06/05/2017   Procedure: IRRIGATION AND DEBRIDEMENT PERIRECTAL ABSCESS;  Surgeon: Andria Meuse, MD;  Location:  [facility] OR;  Service: General;  Laterality: N/A;  . VIDEO BRONCHOSCOPY Bilateral 05/08/2016   Procedure: VIDEO BRONCHOSCOPY WITH FLUORO;  Surgeon: Oretha Milch, MD;  Location: [facility];  Service: Cardiopulmonary;  Laterality: Bilateral;        Home Medications    Prior to Admission medications   Medication Sig Start Date End Date Taking? Authorizing Provider  albuterol (PROVENTIL HFA;VENTOLIN HFA) 108 (90 Base) MCG/ACT inhaler Inhale 2 puffs into the lungs every 6 (six) hours as  needed for wheezing or shortness of breath. 05/15/18   [name], MD  amoxicillin-clavulanate (AUGMENTIN) 875-125 MG tablet Take 1 tablet by mouth 2 (two) times daily. One po bid x 7 days 07/30/18   Zadie Rhine, MD  atorvastatin (LIPITOR) 80 MG tablet Take 1 tablet (80 mg total) by mouth daily. 05/15/18   [name], MD  carvedilol (COREG) 3.125 MG tablet Take 1 tablet (3.125 mg total) by mouth 2 (two) times daily. 05/15/18 05/15/19  [name], MD  cetirizine (ZYRTEC) 10 MG tablet Take 1 tablet (10 mg total) by mouth daily. 06/18/18   [name], MD  docusate sodium (COLACE) 100 MG capsule Take 1 capsule (100 mg total) by mouth every 12 (twelve) hours. Patient not taking: Reported on 03/30/2018 06/02/17   Roxy Horseman, PA-C  fluticasone furoate-vilanterol (BREO ELLIPTA) 100-25 MCG/INH AEPB Inhale 1 puff into the lungs daily. 05/15/18   [name], MD  furosemide (LASIX) 40 MG tablet Take 1 tablet (40 mg total) by mouth daily. 05/16/18 05/16/19  [name], MD  gabapentin (NEURONTIN) 300 MG capsule Take 1 capsule (300 mg total) by mouth at bedtime. 06/18/18   [name], MD  losartan (COZAAR) 50 MG tablet Take 1 tablet (50 mg total) by mouth daily. 05/15/18   [name], MD  OXYGEN Place 2 L into the nose daily. 2lpm when needed per pt    [provider]  rivaroxaban (XARELTO) 20 MG TABS tablet Take 1 tablet (20 mg total) by mouth daily with supper. 05/15/18   [name], MD    Family History Family History  Problem Relation Age of Onset  . Heart disease Mother   . Diabetes Mother   . Colon cancer Mother   . Liver cancer Mother   . Cancer Father        type unknown  . Diabetes Sister        x 2  . Diabetes Brother     Social History Social History   Tobacco Use  . Smoking status: Current Every Day Smoker    Packs/day: 0.25    Years: 42.00    Pack years: 10.50    Types: Cigarettes  . Smokeless tobacco: Never Used  . Tobacco  comment: 3 cigs daily  Substance Use Topics  . Alcohol use: Yes    Alcohol/week: 0.0 standard drinks    Comment: last drink was before xmas  . Drug use: No    Types: Cocaine, Marijuana    Comment: "nothing in 20 years"     Allergies   Lisinopril   Review of Systems Review of Systems  Constitutional: Negative for fever.  HENT: Positive for facial swelling. Negative for dental problem and trouble swallowing.   Respiratory: Positive for shortness of breath.   Cardiovascular: Negative for chest pain.  Gastrointestinal: Negative for abdominal pain and vomiting.  All other systems reviewed and are negative.    Physical Exam Updated Vital Signs BP 98/69 (BP Location: Right Arm)   Pulse Marland Kitchen)  101   Temp 98.6 F (37 C) (Oral)   Resp 19   Ht 1.905 m (6\' 3" )   Wt 96.2 kg   SpO2 98%   BMI 26.50 kg/m   Physical Exam CONSTITUTIONAL: Well developed/well nourished HEAD: Normocephalic/atraumatic EYES: EOMI/PERRL ENMT: Mucous membranes moist, mild swelling and tenderness to the left preauricular region.  There is no overlying erythema or abscess.  No crepitus.  There is associated left cervical lymphadenopathy.  There is no malocclusion, no trismus.  No intraoral swelling.  No obvious gingival swelling. NECK: supple no meningeal signs SPINE/BACK:entire spine nontender CV: S1/S2 noted LUNGS: Scattered wheezing bilaterally ABDOMEN: soft, nontender, no rebound or guarding, bowel sounds noted throughout abdomen GU:no cva tenderness NEURO: Pt is awake/alert/appropriate, moves all extremitiesx4.  No facial droop.   EXTREMITIES: pulses normal/equal, full ROM SKIN: warm, color normal PSYCH: no abnormalities of mood noted, alert and oriented to situation   ED Treatments / Results  Labs (all labs ordered are listed, but only abnormal results are displayed) Labs Reviewed - No data to display  EKG EKG Interpretation  Date/Time:  Tuesday July 29 2018 17:49:45 EST Ventricular Rate:   101 PR Interval:  124 QRS Duration: 86 QT Interval:  364 QTC Calculation: 471 R Axis:   -59 Text Interpretation:  Sinus tachycardia with Premature supraventricular complexes Left axis deviation Abnormal ECG Confirmed by Zadie Rhine ([id number]) on 07/30/2018 3:28:12 AM   Radiology No results found.  Procedures Procedures   Medications Ordered in ED Medications  amoxicillin-clavulanate (AUGMENTIN) 875-125 MG per tablet 1 tablet (1 tablet Oral Given 07/30/18 0358)     Initial Impression / Assessment and Plan / ED Course  I have reviewed the triage vital signs and the nursing notes.      4:02 AM By the time of my evaluation after patient was waiting several hours, most of his symptoms have resolved.  He reports his respiratory effort is at baseline.  He is able to continue his home oxygen.  He reports the facial swelling is improved.  Strong suspicion for parotitis Will start Oral antibiotics and recommend sialoguoges Pt feels comfortable for d/c home and feels at baseline with his respiratory status Final Clinical Impressions(s) / ED Diagnoses   Final diagnoses:  Parotitis  Shortness of breath    ED Discharge Orders         Ordered    amoxicillin-clavulanate (AUGMENTIN) 875-125 MG tablet  2 times daily     07/30/18 7846           Zadie Rhine, MD 07/30/18 0405

## 2018-07-30 NOTE — ED Notes (Signed)
Called [facility] 

## 2018-07-30 NOTE — ED Notes (Signed)
ED Provider at bedside. 

## 2018-07-30 NOTE — ED Notes (Signed)
Reviewed d/c instructions with pt, who verbalized understanding and had no outstanding questions. No signature pad available d/t lack of fully functional WOW. Oxygen provided for pt until [facility] crew's arrival. Pt departed in NAD, and in care of [facility] crew.

## 2018-08-25 MED FILL — CARVEDILOL 3.125 MG TABLET: 3.125 | 30 days supply | Qty: 60 | Fill #2

## 2018-08-25 MED FILL — $BREO ELLIPTA 100-25 MCG IH: 100-25 MCG | 90 days supply | Qty: 180 | Fill #2

## 2018-08-25 MED FILL — XARELTO 20 MG TABLET: 20 | 30 days supply | Qty: 30 | Fill #2

## 2018-08-25 MED FILL — FUROSEMIDE 40 MG TAB: 40 | 30 days supply | Qty: 30 | Fill #2

## 2018-08-25 MED FILL — $VENTOLIN HFA 18G INHALER: 108 (90 BAS | 75 days supply | Qty: 54 | Fill #3

## 2018-08-25 MED FILL — ATORVASTATIN 80 MG TABLET: 80 | 30 days supply | Qty: 30 | Fill #2

## 2018-08-25 MED FILL — LOSARTAN POTASSIUM 50 MG TA: 50 | 30 days supply | Qty: 30 | Fill #2

## 2018-08-25 MED FILL — GABAPENTIN 300 MG CAPSULE: 300 | 30 days supply | Qty: 30 | Fill #2

## 2018-09-04 ENCOUNTER — Encounter (HOSPITAL_COMMUNITY): Payer: Self-pay

## 2018-09-04 ENCOUNTER — Inpatient Hospital Stay (HOSPITAL_COMMUNITY)
Admission: EM | Admit: 2018-09-04 | Discharge: 2018-09-07 | DRG: 812 | Disposition: A | Discharge status: Home / Self Care | Payer: Medicaid Other | Attending: Internal Medicine | Admitting: Internal Medicine

## 2018-09-04 ENCOUNTER — Other Ambulatory Visit: Payer: Self-pay

## 2018-09-04 ENCOUNTER — Emergency Department (HOSPITAL_COMMUNITY): Payer: Medicaid Other

## 2018-09-04 DIAGNOSIS — K648 Other hemorrhoids: Secondary | ICD-10-CM | POA: Diagnosis present

## 2018-09-04 DIAGNOSIS — I4892 Unspecified atrial flutter: Secondary | ICD-10-CM | POA: Diagnosis present

## 2018-09-04 DIAGNOSIS — Z9981 Dependence on supplemental oxygen: Secondary | ICD-10-CM

## 2018-09-04 DIAGNOSIS — I5042 Chronic combined systolic (congestive) and diastolic (congestive) heart failure: Secondary | ICD-10-CM | POA: Diagnosis present

## 2018-09-04 DIAGNOSIS — D500 Iron deficiency anemia secondary to blood loss (chronic): Secondary | ICD-10-CM

## 2018-09-04 DIAGNOSIS — F17200 Nicotine dependence, unspecified, uncomplicated: Secondary | ICD-10-CM

## 2018-09-04 DIAGNOSIS — I2510 Atherosclerotic heart disease of native coronary artery without angina pectoris: Secondary | ICD-10-CM | POA: Diagnosis not present

## 2018-09-04 DIAGNOSIS — I428 Other cardiomyopathies: Secondary | ICD-10-CM | POA: Diagnosis present

## 2018-09-04 DIAGNOSIS — J9611 Chronic respiratory failure with hypoxia: Secondary | ICD-10-CM | POA: Diagnosis present

## 2018-09-04 DIAGNOSIS — Z720 Tobacco use: Secondary | ICD-10-CM

## 2018-09-04 DIAGNOSIS — I110 Hypertensive heart disease with heart failure: Secondary | ICD-10-CM | POA: Diagnosis present

## 2018-09-04 DIAGNOSIS — Z8673 Personal history of transient ischemic attack (TIA), and cerebral infarction without residual deficits: Secondary | ICD-10-CM

## 2018-09-04 DIAGNOSIS — K921 Melena: Secondary | ICD-10-CM | POA: Diagnosis present

## 2018-09-04 DIAGNOSIS — J441 Chronic obstructive pulmonary disease with (acute) exacerbation: Secondary | ICD-10-CM | POA: Diagnosis present

## 2018-09-04 DIAGNOSIS — Z7901 Long term (current) use of anticoagulants: Secondary | ICD-10-CM

## 2018-09-04 DIAGNOSIS — Z7951 Long term (current) use of inhaled steroids: Secondary | ICD-10-CM

## 2018-09-04 DIAGNOSIS — D649 Anemia, unspecified: Secondary | ICD-10-CM | POA: Diagnosis present

## 2018-09-04 DIAGNOSIS — I10 Essential (primary) hypertension: Secondary | ICD-10-CM | POA: Diagnosis present

## 2018-09-04 DIAGNOSIS — Z79891 Long term (current) use of opiate analgesic: Secondary | ICD-10-CM

## 2018-09-04 DIAGNOSIS — I480 Paroxysmal atrial fibrillation: Secondary | ICD-10-CM | POA: Diagnosis present

## 2018-09-04 DIAGNOSIS — F17210 Nicotine dependence, cigarettes, uncomplicated: Secondary | ICD-10-CM | POA: Diagnosis present

## 2018-09-04 DIAGNOSIS — D509 Iron deficiency anemia, unspecified: Secondary | ICD-10-CM

## 2018-09-04 DIAGNOSIS — D62 Acute posthemorrhagic anemia: Principal | ICD-10-CM | POA: Diagnosis present

## 2018-09-04 DIAGNOSIS — Z800 Family history of malignant neoplasm of digestive organs: Secondary | ICD-10-CM

## 2018-09-04 DIAGNOSIS — J449 Chronic obstructive pulmonary disease, unspecified: Secondary | ICD-10-CM | POA: Diagnosis present

## 2018-09-04 DIAGNOSIS — K922 Gastrointestinal hemorrhage, unspecified: Secondary | ICD-10-CM

## 2018-09-04 DIAGNOSIS — K210 Gastro-esophageal reflux disease with esophagitis: Secondary | ICD-10-CM | POA: Diagnosis present

## 2018-09-04 DIAGNOSIS — Z888 Allergy status to other drugs, medicaments and biological substances status: Secondary | ICD-10-CM

## 2018-09-04 DIAGNOSIS — Z79899 Other long term (current) drug therapy: Secondary | ICD-10-CM

## 2018-09-04 DIAGNOSIS — Z8249 Family history of ischemic heart disease and other diseases of the circulatory system: Secondary | ICD-10-CM

## 2018-09-04 DIAGNOSIS — K449 Diaphragmatic hernia without obstruction or gangrene: Secondary | ICD-10-CM | POA: Diagnosis present

## 2018-09-04 DIAGNOSIS — Z560 Unemployment, unspecified: Secondary | ICD-10-CM

## 2018-09-04 LAB — COMPREHENSIVE METABOLIC PANEL
ALT: 16 U/L (ref 0–44)
AST: 34 U/L (ref 15–41)
Albumin: 3.4 g/dL — ABNORMAL LOW (ref 3.5–5.0)
Alkaline Phosphatase: 46 U/L (ref 38–126)
Anion gap: 8 (ref 5–15)
BUN: 16 mg/dL (ref 8–23)
CO2: 35 mmol/L — ABNORMAL HIGH (ref 22–32)
Calcium: 8.5 mg/dL — ABNORMAL LOW (ref 8.9–10.3)
Chloride: 96 mmol/L — ABNORMAL LOW (ref 98–111)
Creatinine, Ser: 0.95 mg/dL (ref 0.61–1.24)
GFR calc Af Amer: >60 mL/min (ref >60)
GFR calc non Af Amer: >60 mL/min (ref >60)
Glucose, Bld: 101 mg/dL — ABNORMAL HIGH (ref 70–99)
Potassium: 4.6 mmol/L (ref 3.5–5.1)
Sodium: 139 mmol/L (ref 135–145)
Total Bilirubin: 0.7 mg/dL (ref 0.3–1.2)
Total Protein: 5.7 g/dL — ABNORMAL LOW (ref 6.5–8.1)

## 2018-09-04 LAB — POCT I-STAT EG7
Acid-Base Excess: 18 mmol/L — ABNORMAL HIGH (ref 0.0–2.0)
Bicarbonate: 43.6 mmol/L — ABNORMAL HIGH (ref 20.0–28.0)
Calcium, Ion: 1.07 mmol/L — ABNORMAL LOW (ref 1.15–1.40)
HCT: 17 % — ABNORMAL LOW (ref 39.0–52.0)
Hemoglobin: 5.8 g/dL — CL (ref 13.0–17.0)
O2 Saturation: 98 %
Potassium: 4.5 mmol/L (ref 3.5–5.1)
Sodium: 139 mmol/L (ref 135–145)
TCO2: 45 mmol/L — ABNORMAL HIGH (ref 22–32)
pCO2, Ven: 63.7 mmHg — ABNORMAL HIGH (ref 44.0–60.0)
pH, Ven: 7.443 — ABNORMAL HIGH (ref 7.250–7.430)
pO2, Ven: 103 mmHg — ABNORMAL HIGH (ref 32.0–45.0)

## 2018-09-04 LAB — CBC WITH DIFFERENTIAL/PLATELET
Abs Immature Granulocytes: 0.02 10*3/uL (ref 0.00–0.07)
Basophils Absolute: 0 10*3/uL (ref 0.0–0.1)
Basophils Relative: 0 %
Eosinophils Absolute: 0.4 10*3/uL (ref 0.0–0.5)
Eosinophils Relative: 5 %
HCT: 17.8 % — ABNORMAL LOW (ref 39.0–52.0)
Hemoglobin: 4.9 g/dL — CL (ref 13.0–17.0)
Immature Granulocytes: 0 %
Lymphocytes Relative: 39 %
Lymphs Abs: 3.6 10*3/uL (ref 0.7–4.0)
MCH: 24.4 pg — ABNORMAL LOW (ref 26.0–34.0)
MCHC: 27.5 g/dL — ABNORMAL LOW (ref 30.0–36.0)
MCV: 88.6 fL (ref 80.0–100.0)
Monocytes Absolute: 1.1 10*3/uL — ABNORMAL HIGH (ref 0.1–1.0)
Monocytes Relative: 12 %
Neutro Abs: 4.2 10*3/uL (ref 1.7–7.7)
Neutrophils Relative %: 44 %
Platelets: 196 10*3/uL (ref 150–400)
RBC: 2.01 MIL/uL — ABNORMAL LOW (ref 4.22–5.81)
RDW: 16.5 % — ABNORMAL HIGH (ref 11.5–15.5)
WBC: 9.3 10*3/uL (ref 4.0–10.5)
nRBC: 0.4 % — ABNORMAL HIGH (ref 0.0–0.2)

## 2018-09-04 LAB — PREPARE RBC (CROSSMATCH)

## 2018-09-04 LAB — TROPONIN I: Troponin I: <0.03 ng/mL (ref <0.03)

## 2018-09-04 LAB — POC OCCULT BLOOD, ED: Fecal Occult Bld: POSITIVE — AB

## 2018-09-04 LAB — BRAIN NATRIURETIC PEPTIDE: B Natriuretic Peptide: 75.2 pg/mL (ref 0.0–100.0)

## 2018-09-04 MED ORDER — GABAPENTIN 300 MG PO CAPS: 300.0000 mg | ORAL_CAPSULE | Freq: Every day | ORAL | Status: DC

## 2018-09-04 MED ORDER — TRAZODONE HCL 50 MG PO TABS: 25.0000 mg | ORAL_TABLET | Freq: Every evening | ORAL | Status: DC | PRN

## 2018-09-04 MED ORDER — ACETAMINOPHEN 325 MG PO TABS: 650.0000 mg | ORAL_TABLET | Freq: Four times a day (QID) | ORAL | Status: DC | PRN

## 2018-09-04 MED ORDER — METHYLPREDNISOLONE SODIUM SUCC 125 MG IJ SOLR
125.0000 mg | Freq: Once | INTRAMUSCULAR | Status: AC
  Administered 2018-09-04: 125 mg via INTRAVENOUS
  Filled 2018-09-04: qty 2

## 2018-09-04 MED ORDER — ACETAMINOPHEN 650 MG RE SUPP: 650.0000 mg | Freq: Four times a day (QID) | RECTAL | Status: DC | PRN

## 2018-09-04 MED ORDER — FLUTICASONE FUROATE-VILANTEROL 100-25 MCG/INH IN AEPB: 1.0000 | INHALATION_SPRAY | Freq: Every day | RESPIRATORY_TRACT | Status: DC

## 2018-09-04 MED ORDER — OXYCODONE HCL 5 MG PO TABS: 5.0000 mg | ORAL_TABLET | ORAL | Status: DC | PRN

## 2018-09-04 MED ORDER — ATORVASTATIN CALCIUM 80 MG PO TABS
80.0000 mg | ORAL_TABLET | Freq: Every day | ORAL | Status: DC
  Administered 2018-09-05 – 2018-09-07 (×3): 80 mg via ORAL
  Filled 2018-09-04 (×3): qty 1

## 2018-09-04 MED ORDER — ALBUTEROL SULFATE (2.5 MG/3ML) 0.083% IN NEBU: 2.5000 mg | INHALATION_SOLUTION | RESPIRATORY_TRACT | Status: DC | PRN

## 2018-09-04 MED ORDER — PANTOPRAZOLE SODIUM 40 MG IV SOLR
40.0000 mg | Freq: Two times a day (BID) | INTRAVENOUS | Status: DC
  Administered 2018-09-04 – 2018-09-05 (×2): 40 mg via INTRAVENOUS
  Filled 2018-09-04 (×2): qty 40

## 2018-09-04 MED ORDER — SODIUM CHLORIDE 0.9 % IV SOLN
80.0000 mg | Freq: Once | INTRAVENOUS | Status: AC
  Administered 2018-09-04: 80 mg via INTRAVENOUS
  Filled 2018-09-04: qty 80

## 2018-09-04 MED ORDER — ALBUTEROL (5 MG/ML) CONTINUOUS INHALATION SOLN
5.0000 mg/h | INHALATION_SOLUTION | Freq: Once | RESPIRATORY_TRACT | Status: AC
  Administered 2018-09-04: 5 mg/h via RESPIRATORY_TRACT
  Filled 2018-09-04: qty 20

## 2018-09-04 MED ORDER — IPRATROPIUM BROMIDE HFA 17 MCG/ACT IN AERS
2.0000 | INHALATION_SPRAY | Freq: Once | RESPIRATORY_TRACT | Status: AC
  Administered 2018-09-04: 2 via RESPIRATORY_TRACT
  Filled 2018-09-04: qty 12.9

## 2018-09-04 MED ORDER — PROMETHAZINE HCL 25 MG PO TABS: 12.5000 mg | ORAL_TABLET | Freq: Four times a day (QID) | ORAL | Status: DC | PRN

## 2018-09-04 MED ORDER — GABAPENTIN 300 MG PO CAPS
300.0000 mg | ORAL_CAPSULE | Freq: Every day | ORAL | Status: DC
  Administered 2018-09-05 – 2018-09-07 (×3): 300 mg via ORAL
  Filled 2018-09-04 (×3): qty 1

## 2018-09-04 MED ORDER — CARVEDILOL 3.125 MG PO TABS
3.1250 mg | ORAL_TABLET | Freq: Two times a day (BID) | ORAL | Status: DC
  Administered 2018-09-05 – 2018-09-07 (×5): 3.125 mg via ORAL
  Filled 2018-09-04 (×5): qty 1

## 2018-09-04 MED ORDER — FLUTICASONE FUROATE-VILANTEROL 100-25 MCG/INH IN AEPB
1.0000 | INHALATION_SPRAY | Freq: Every day | RESPIRATORY_TRACT | Status: DC
  Administered 2018-09-05 – 2018-09-07 (×2): 1 via RESPIRATORY_TRACT
  Filled 2018-09-04: qty 28

## 2018-09-04 MED ORDER — SODIUM CHLORIDE 0.9% IV SOLUTION
Freq: Once | INTRAVENOUS | Status: AC
  Administered 2018-09-04: 10 mL/h via INTRAVENOUS

## 2018-09-04 MED ORDER — ALBUTEROL SULFATE HFA 108 (90 BASE) MCG/ACT IN AERS
4.0000 | INHALATION_SPRAY | Freq: Once | RESPIRATORY_TRACT | Status: AC
  Administered 2018-09-04: 4 via RESPIRATORY_TRACT
  Filled 2018-09-04: qty 6.7

## 2018-09-04 MED ORDER — DEXTROSE-NACL 5-0.45 % IV SOLN
INTRAVENOUS | Status: DC
  Administered 2018-09-05 – 2018-09-06 (×2): via INTRAVENOUS

## 2018-09-04 MED ORDER — IPRATROPIUM-ALBUTEROL 0.5-2.5 (3) MG/3ML IN SOLN
3.0000 mL | Freq: Four times a day (QID) | RESPIRATORY_TRACT | Status: DC
  Administered 2018-09-04 – 2018-09-05 (×4): 3 mL via RESPIRATORY_TRACT
  Filled 2018-09-04 (×4): qty 3

## 2018-09-04 NOTE — ED Triage Notes (Signed)
Pt presents with 1 week h/o shortness of breath and cough.  Nebs x 2 given PTA; reports mid-chest pain x 1 week; on home O2  @ 3L.  BP: 90/58 with NS infused enroute.

## 2018-09-04 NOTE — H&P (Signed)
History and Physical    Jared [name] HQP:[id number] DOB: 1955/11/28 DOA: 09/04/2018  PCP: Jared Register, MD Patient coming from: Home  Chief Complaint: "I could not breathe"  HPI: Jared [name] is a 63 y.o. male with history of COPD on 3 L by nasal cannula, CAD status post LHC in 5/12, combined CHF, CVA without residual deficit, hypertension, tobacco use disorder and a flutter on Xarelto presenting with worsening shortness of breath for the last 3 days.  Reports intermittent dyspnea at baseline.  Dyspnea is worse with exertion.  Improved with breathing treatments.  Also reports "some cough" with whitish phlegm but no hemoptysis.  Reports intermittent substernal chest pain.  Describes the pain as dull.  He is currently chest pain-free.  Chest pain is not necessarily exertional.  Also reports weakness and dyspnea with minimal ambulation from his bed to the door.  Reports lightheadedness and palpitation.  He has history of flutter.  Reports 2 pillow orthopnea at baseline.  Denies PND.  Denies URI symptoms, fever, chills, nausea, vomiting, abdominal pain, diarrhea, urinary symptoms, sick contact or recent travel.  Reports dark stool for the last 2 weeks.  Denies hematochezia.  Reports using BC powder daily.  Says he had a colonoscopy over 10 years ago.  Was told to have polyps.  Denies history of colon cancer or IBD.  Reports good compliance with his medication.  Last dose on Xarelto was this morning.  Lives alone.  Reports smoking about 7 cigarettes a day.  Denies drinking alcohol or recreational drug use.  In ED, vital signs not impressive.  CBC remarkable for hemoglobin to 4.9 (14 in 05/2018).  CMP, troponin and BNP not impressive.  Chest x-ray consistent with COPD.  EKG normal sinus rhythm.  FOBT positive.  Given Solu-Medrol, albuterol and DuoNeb for presumed COPD exacerbation.  Started on Protonix drip.  Typed and crossed.  2 units of blood ordered by EDP.  GI consulted and hospitalist service was  called for admission.   ROS A twelve point review of system negative except for pertinent positives and negatives as history of present illness above.  PMH Past Medical History:  Diagnosis Date  . CAD (coronary artery disease)    a. LHC 5/12:  LAD 20, pLCx 20, pRCA 40, dRCA 40, EF 35%, diff HK  //  b. Myoview 4/16: Overall Impression: High risk stress nuclear study There is no evidence of ischemia. There is severe LV dysfunction. LV Ejection Fraction: 30%. LV Wall Motion: There is global LV hypokinesis.   Mar[facility] Kitchen CAP (community acquired pneumonia) 09/2013  . Chronic combined systolic and diastolic CHF (congestive heart failure) (HCC)    a. Echo 4/16:Mild LVH, EF 40-45%, diffuse HK //  b. Echo 8/17: EF 35-40%, diffuse HK, diastolic dysfunction, aortic sclerosis, trivial MR, moderate LAE, normal RVSF, moderate RAE, mild TR, PASP 42 mmHg // c. Echo 4/18: Mild concentric LVH, EF 30-35, normal wall motion, grade 1 diastolic dysfunction, PASP 49  . Cluster headache    "hx; haven't had one in awhile" (01/09/2016)  . COPD (chronic obstructive pulmonary disease) (HCC)    Jared [name] 01/09/2016  . History of CVA (cerebrovascular accident)   . Hypertension   . NICM (nonischemic cardiomyopathy) (HCC)   . Tobacco abuse    PSH Past Surgical History:  Procedure Laterality Date  . CARDIAC CATHETERIZATION  10/2010   LM normal, LAD with 20% irregularities, LCX with 20%, RCA with 40% prox and 40% distal - EF of 35%  . COLONOSCOPY W/ BIOPSIES AND  POLYPECTOMY    . EXCISION MASS HEAD    . INCISION AND DRAINAGE PERIRECTAL ABSCESS N/A 06/05/2017   Procedure: IRRIGATION AND DEBRIDEMENT PERIRECTAL ABSCESS;  Surgeon: Jared Meuse, MD;  Location: [facility] OR;  Service: General;  Laterality: N/A;  . VIDEO BRONCHOSCOPY Bilateral 05/08/2016   Procedure: VIDEO BRONCHOSCOPY WITH FLUORO;  Surgeon: Jared Milch, MD;  Location: [facility] ENDOSCOPY;  Service: Cardiopulmonary;  Laterality: Bilateral;   Fam HX Family History   Problem Relation Age of Onset  . Heart disease Mother   . Diabetes Mother   . Colon cancer Mother   . Liver cancer Mother   . Cancer Father        type unknown  . Diabetes Sister        x 2  . Diabetes Brother    Social Hx  reports that he has been smoking cigarettes. He has a 10.50 pack-year smoking history. He has never used smokeless tobacco. He reports current alcohol use. He reports that he does not use drugs.  Allergy Allergies  Allergen Reactions  . Lisinopril Cough   Home Meds Prior to Admission medications   Medication Sig Start Date End Date Taking? Authorizing Provider  albuterol (PROVENTIL HFA;VENTOLIN HFA) 108 (90 Base) MCG/ACT inhaler Inhale 2 puffs into the lungs every 6 (six) hours as needed for wheezing or shortness of breath. 05/15/18   Jared Register, MD  amoxicillin-clavulanate (AUGMENTIN) 875-125 MG tablet Take 1 tablet by mouth 2 (two) times daily. One po bid x 7 days 07/30/18   Jared Rhine, MD  atorvastatin (LIPITOR) 80 MG tablet Take 1 tablet (80 mg total) by mouth daily. 05/15/18   Jared Register, MD  carvedilol (COREG) 3.125 MG tablet Take 1 tablet (3.125 mg total) by mouth 2 (two) times daily. 05/15/18 05/15/19  Jared Register, MD  cetirizine (ZYRTEC) 10 MG tablet Take 1 tablet (10 mg total) by mouth daily. 06/18/18   Jared Register, MD  docusate sodium (COLACE) 100 MG capsule Take 1 capsule (100 mg total) by mouth every 12 (twelve) hours. Patient not taking: Reported on 03/30/2018 06/02/17   Jared Horseman, PA-C  fluticasone furoate-vilanterol (BREO ELLIPTA) 100-25 MCG/INH AEPB Inhale 1 puff into the lungs daily. 05/15/18   Jared Register, MD  furosemide (LASIX) 40 MG tablet Take 1 tablet (40 mg total) by mouth daily. 05/16/18 05/16/19  Jared Register, MD  gabapentin (NEURONTIN) 300 MG capsule Take 1 capsule (300 mg total) by mouth at bedtime. 06/18/18   Jared Register, MD  losartan (COZAAR) 50 MG tablet Take 1 tablet (50 mg total) by mouth daily.  05/15/18   Jared Register, MD  OXYGEN Place 2 L into the nose daily. 2lpm when needed per pt    [provider]  rivaroxaban (XARELTO) 20 MG TABS tablet Take 1 tablet (20 mg total) by mouth daily with supper. 05/15/18   Jared Register, MD    Physical Exam: Vitals:   09/04/18 1615 09/04/18 1732 09/04/18 1829 09/04/18 1832  BP:  113/64 (!) 119/59 (!) 119/59  Pulse:  81 82 82  Resp:  17 15   Temp:   98.1 F (36.7 C) 98.1 F (36.7 C)  TempSrc:    Oral  SpO2:  100% 100% 100%  Weight: 94.3 kg     Height: 6\' 3"  (1.905 m)       Constitutional - resting comfortably, no acute distress Eyes - vision grossly intact. Sclera anicteric.  Nose - no gross deformity or drainage Mouth - no oral lesions  noted Throat - no swelling or erythema Endo - no obvious thyromegaly CV -RRR. (+)S1S2, no murmurs; no JVD or peripheral edema Resp - No increased work of breathing, poor aeration bilaterally.  No wheeze or crackles. GI - (+)BS, soft, non-tender, non-distended MSK - normal range of motion, no obvious deformity Skin - no obvious rashes or lesions Neuro - alert, aware, oriented to person/place/time. No gross deficit  Psych - calm, normal mood and affect   Labs on Admission: I have personally reviewed following labs and imaging studies  CBC: Recent Labs  Lab 09/04/18 1629 09/04/18 1646  WBC 9.3  --   NEUTROABS 4.2  --   HGB 4.9* 5.8*  HCT 17.8* 17.0*  MCV 88.6  --   PLT 196  --    Basic Metabolic Panel: Recent Labs  Lab 09/04/18 1629 09/04/18 1646  NA 139 139  K 4.6 4.5  CL 96*  --   CO2 35*  --   GLUCOSE 101*  --   BUN 16  --   CREATININE 0.95  --   CALCIUM 8.5*  --    GFR: Estimated Creatinine Clearance: 93.9 mL/min (by C-G formula based on SCr of 0.95 mg/dL). Liver Function Tests: Recent Labs  Lab 09/04/18 1629  AST 34  ALT 16  ALKPHOS 46  BILITOT 0.7  PROT 5.7*  ALBUMIN 3.4*   No results for input(s): LIPASE, AMYLASE in the last 168 hours. No results  for input(s): AMMONIA in the last 168 hours. Coagulation Profile: No results for input(s): INR, PROTIME in the last 168 hours. Cardiac Enzymes: Recent Labs  Lab 09/04/18 1629  TROPONINI <0.03   BNP (last 3 results) No results for input(s): PROBNP in the last 8760 hours. HbA1C: No results for input(s): HGBA1C in the last 72 hours. CBG: No results for input(s): GLUCAP in the last 168 hours. Lipid Profile: No results for input(s): CHOL, HDL, LDLCALC, TRIG, CHOLHDL, LDLDIRECT in the last 72 hours. Thyroid Function Tests: No results for input(s): TSH, T4TOTAL, FREET4, T3FREE, THYROIDAB in the last 72 hours. Anemia Panel: No results for input(s): VITAMINB12, FOLATE, FERRITIN, TIBC, IRON, RETICCTPCT in the last 72 hours. Urine analysis:    Component Value Date/Time   COLORURINE YELLOW 07/25/2016 1845   APPEARANCEUR HAZY (A) 07/25/2016 1845   LABSPEC 1.017 07/25/2016 1845   PHURINE 5.0 07/25/2016 1845   GLUCOSEU 50 (A) 07/25/2016 1845   HGBUR SMALL (A) 07/25/2016 1845   BILIRUBINUR NEGATIVE 07/25/2016 1845   KETONESUR NEGATIVE 07/25/2016 1845   PROTEINUR 100 (A) 07/25/2016 1845   NITRITE NEGATIVE 07/25/2016 1845   LEUKOCYTESUR NEGATIVE 07/25/2016 1845    Sepsis Labs:  No leukocytosis  Radiological Exams on Admission: Dg Chest Portable 1 View  Result Date: 09/04/2018 CLINICAL DATA:  Central chest pain. Shortness of breath and cough for 1 week. EXAM: PORTABLE CHEST 1 VIEW COMPARISON:  None. FINDINGS: Cardiac silhouette is normal in size. No mediastinal or hilar masses. No evidence of adenopathy. Lungs are hyperexpanded with mildly prominent bronchovascular and interstitial markings, stable from the prior exam. Lungs are otherwise clear. No pleural effusion or pneumothorax. Skeletal structures are grossly intact. IMPRESSION: 1. No acute cardiopulmonary disease. 2. Lung hyperexpansion consistent with COPD. Electronically Signed   By: Amie Portland M.D.   On: 09/04/2018 16:56    All  images have been reviewed by me personally.   EKG: Independently reviewed.  Normal sinus rhythm  Assessment/Plan Principal Problem:   Symptomatic anemia Active Problems:   Essential hypertension  CAD (coronary artery disease)   COPD with acute exacerbation (HCC)   Atrial flutter (HCC)   Tobacco use disorder  Symptomatic anemia due to upper GI bleed: Reports melena.  FOBT positive.  Likely due to NSAID use together with Xarelto.  More dynamically stable. -Typed and crossed and 2 units of blood ordered in ED. -Two bore IV lines -IV Protonix 40 mg twice daily -Monitor H&H every 6 hours -Goal hemoglobin greater than 8 given CAD. [facility] consulted by EDP. -Clear liquid diet. -Hold cardiac meds except for Coreg. -Hold Xarelto  Chronic COPD exacerbation: Dyspnea likely due to anemia versus COPD exacerbation.  Poor aeration likely due to emphysema.  He is saturating at 100% on 4 L by nasal cannula. -Status post Solu-Medrol, DuoNeb and albuterol in ED. -We will hold off systemic steroid  -Continue home Breo Ellipta -DuoNeb -Wean off oxygen to maintain saturation between 88 and 92%.  Atypical chest pain: Likely due to anemia.  Troponin and EKG reassuring so far.  Combined systolic diastolic CHF: Echo in 04/2018 with EF of 45 to 50% and G1 DD but no other significant finding.  Appears euvolemic. -May resume home Lasix if remains hemodynamically stable. -Daily weight, intake output and renal function. -Continue Coreg.  Consider changing to metoprolol for beta-1 selectivity given advanced COPD. -Hold losartan.  Atrial flutter: Currently in normal sinus rhythm. -Continue Coreg -Hold Xarelto.  Last dose this morning.  DVT prophylaxis: SCD due to GI bleed Code Status: Full code Family Communication: None at bedside  Disposition Plan: Admit to medical telemetry bed.  Consults called: GI Admission status: Observation status   Almon Herculesaye T Brooke Steinhilber MD [facility]  If 7PM-7AM,  please contact night-coverage [email or web address] Password [email or web address]  09/04/2018, 6:57 PM

## 2018-09-04 NOTE — ED Provider Notes (Signed)
[facility] EMERGENCY DEPARTMENT Provider Note   CSN: [id number] Arrival date & time: 09/04/18  1603    History   Chief Complaint Chief Complaint  Patient presents with  . Shortness of Breath    HPI Locke Barrell is a 63 y.o. male with a history of CAD, COPD, CHF, CVA, and hypertension who presents the emergency department complaining of shortness of breath for the past week.  Patient states that his shortness of breath has been associated with intermittent nonproductive cough which has improved over the course of the last few days as well as intermittent chest pain.  He denies any recent fevers, chills, sick contacts, or travel to endemic areas.  He states that he has had a few family members come and go from his home over the past week however no one with any suspected illness.  He reports compliance with his COPD inhalers however continues to smoke intermittently.  He states that he has been eating and drinking well and without issue and has had no constipation or diarrhea.  Patient does state his bowel movements have seemed darker over the last week.      Illness  Severity:  Severe Onset quality:  Gradual Duration:  1 week Timing:  Constant Progression:  Worsening Chronicity:  Recurrent Associated symptoms: chest pain, cough, shortness of breath and wheezing   Associated symptoms: no abdominal pain, no diarrhea, no ear pain, no fever, no nausea, no rash, no sore throat and no vomiting     Past Medical History:  Diagnosis Date  . CAD (coronary artery disease)    a. LHC 5/12:  LAD 20, pLCx 20, pRCA 40, dRCA 40, EF 35%, diff HK  //  b. Myoview 4/16: Overall Impression: High risk stress nuclear study There is no evidence of ischemia. There is severe LV dysfunction. LV Ejection Fraction: 30%. LV Wall Motion: There is global LV hypokinesis.   Marland Kitchen CAP (community acquired pneumonia) 09/2013  . Chronic combined systolic and diastolic CHF (congestive heart failure)  (HCC)    a. Echo 4/16:Mild LVH, EF 40-45%, diffuse HK //  b. Echo 8/17: EF 35-40%, diffuse HK, diastolic dysfunction, aortic sclerosis, trivial MR, moderate LAE, normal RVSF, moderate RAE, mild TR, PASP 42 mmHg // c. Echo 4/18: Mild concentric LVH, EF 30-35, normal wall motion, grade 1 diastolic dysfunction, PASP 49  . Cluster headache    "hx; haven't had one in awhile" (01/09/2016)  . COPD (chronic obstructive pulmonary disease) (HCC)    [name] 01/09/2016  . History of CVA (cerebrovascular accident)   . Hypertension   . NICM (nonischemic cardiomyopathy) (HCC)   . Tobacco abuse     Patient Active Problem List   Diagnosis Date Noted  . Symptomatic anemia 09/04/2018  . Tobacco use disorder 09/04/2018  . Noncompliance 06/14/2017  . Elevated LFTs 08/13/2016  . Chronic respiratory failure with hypoxia (HCC) 07/24/2016  . Atrial flutter (HCC)   . Hepatic congestion 07/02/2016  . Chronic combined systolic and diastolic CHF (congestive heart failure) (HCC) 07/01/2016  . COPD with acute exacerbation (HCC) 06/03/2016  . Prediabetes 05/14/2016  . Hemoptysis 05/06/2016  . COPD GOLD III/still smoking  02/29/2016  . Cigarette smoker 01/09/2016  . CAD (coronary artery disease) 10/07/2013  . 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 Centrilobular emphysema (HCC) 10/04/2013  . Pulmonary infiltrate in right lung on CXR 10/03/2013  . Acute on chronic respiratory failure with hypoxia (HCC) 10/03/2013  . Essential hypertension     Past Surgical History:  Procedure Laterality Date  . CARDIAC CATHETERIZATION  10/2010   LM normal, LAD with 20% irregularities, LCX with 20%, RCA with 40% prox and 40% distal - EF of 35%  . COLONOSCOPY W/ BIOPSIES AND POLYPECTOMY    . EXCISION MASS HEAD    . INCISION AND DRAINAGE PERIRECTAL ABSCESS N/A 06/05/2017   Procedure: IRRIGATION AND DEBRIDEMENT PERIRECTAL ABSCESS;  Surgeon: Andria Meuse, MD;  Location: [facility] OR;  Service: General;  Laterality:  N/A;  . VIDEO BRONCHOSCOPY Bilateral 05/08/2016   Procedure: VIDEO BRONCHOSCOPY WITH FLUORO;  Surgeon: Oretha Milch, MD;  Location: [facility] ENDOSCOPY;  Service: Cardiopulmonary;  Laterality: Bilateral;        Home Medications    Prior to Admission medications   Medication Sig Start Date End Date Taking? Authorizing Provider  albuterol (PROVENTIL HFA;VENTOLIN HFA) 108 (90 Base) MCG/ACT inhaler Inhale 2 puffs into the lungs every 6 (six) hours as needed for wheezing or shortness of breath. 05/15/18   [name], MD  amoxicillin-clavulanate (AUGMENTIN) 875-125 MG tablet Take 1 tablet by mouth 2 (two) times daily. One po bid x 7 days 07/30/18   Zadie Rhine, MD  atorvastatin (LIPITOR) 80 MG tablet Take 1 tablet (80 mg total) by mouth daily. 05/15/18   [name], MD  carvedilol (COREG) 3.125 MG tablet Take 1 tablet (3.125 mg total) by mouth 2 (two) times daily. 05/15/18 05/15/19  [name], MD  cetirizine (ZYRTEC) 10 MG tablet Take 1 tablet (10 mg total) by mouth daily. 06/18/18   [name], MD  docusate sodium (COLACE) 100 MG capsule Take 1 capsule (100 mg total) by mouth every 12 (twelve) hours. Patient not taking: Reported on 03/30/2018 06/02/17   Roxy Horseman, PA-C  fluticasone furoate-vilanterol (BREO ELLIPTA) 100-25 MCG/INH AEPB Inhale 1 puff into the lungs daily. 05/15/18   [name], MD  furosemide (LASIX) 40 MG tablet Take 1 tablet (40 mg total) by mouth daily. 05/16/18 05/16/19  [name], MD  gabapentin (NEURONTIN) 300 MG capsule Take 1 capsule (300 mg total) by mouth at bedtime. 06/18/18   [name], MD  losartan (COZAAR) 50 MG tablet Take 1 tablet (50 mg total) by mouth daily. 05/15/18   [name], MD  OXYGEN Place 2 L into the nose daily. 2lpm when needed per pt    [provider]  rivaroxaban (XARELTO) 20 MG TABS tablet Take 1 tablet (20 mg total) by mouth daily with supper. 05/15/18   [name], MD    Family History Family  History  Problem Relation Age of Onset  . Heart disease Mother   . Diabetes Mother   . Colon cancer Mother   . Liver cancer Mother   . Cancer Father        type unknown  . Diabetes Sister        x 2  . Diabetes Brother     Social History Social History   Tobacco Use  . Smoking status: Current Every Day Smoker    Packs/day: 0.25    Years: 42.00    Pack years: 10.50    Types: Cigarettes  . Smokeless tobacco: Never Used  . Tobacco comment: 3 cigs daily  Substance Use Topics  . Alcohol use: Yes    Alcohol/week: 0.0 standard drinks    Comment: last drink was before xmas  . Drug use: No    Types: Cocaine, Marijuana    Comment: "nothing in 20 years"     Allergies   Lisinopril   Review  of Systems Review of Systems  Constitutional: Negative for chills and fever.  HENT: Negative for ear pain and sore throat.   Eyes: Negative for pain and visual disturbance.  Respiratory: Positive for cough, shortness of breath and wheezing.   Cardiovascular: Positive for chest pain. Negative for palpitations.  Gastrointestinal: Negative for abdominal pain, constipation, diarrhea, nausea and vomiting.       Dark stool.  Genitourinary: Negative for dysuria and hematuria.  Musculoskeletal: Negative for arthralgias and back pain.  Skin: Negative for color change and rash.  Neurological: Negative for seizures and syncope.  All other systems reviewed and are negative.    Physical Exam Updated Vital Signs BP 114/62 (BP Location: Left Arm)   Pulse 94   Temp 98.5 F (36.9 C) (Oral)   Resp 15   Ht  (1.905 m)   Wt 91.8 kg   SpO2 98%   BMI 25.30 kg/m   Physical Exam Vitals signs and nursing note reviewed.  Constitutional:      Appearance: He is well-developed.  HENT:     Head: Normocephalic and atraumatic.  Eyes:     Conjunctiva/sclera: Conjunctivae normal.  Neck:     Musculoskeletal: Neck supple.  Cardiovascular:     Rate and Rhythm: Normal rate and regular rhythm.      Heart sounds: No murmur.  Pulmonary:     Comments: Mild increased work of breathing.  Patient is solid tachypneic with pursed lip breathing pattern.  Decreased lung sounds heard throughout all fields with expiratory wheeze appreciated on the right base. Abdominal:     Palpations: Abdomen is soft.     Tenderness: There is no abdominal tenderness.  Genitourinary:    Comments: Rectal examination performed in the presence of nursing chaperone Ples Specter.  No gross blood noted.  Dark black stool on glove which is guaiac positive. Musculoskeletal: Normal range of motion.        General: No swelling.     Right lower leg: No edema.     Left lower leg: No edema.  Skin:    General: Skin is warm and dry.  Neurological:     Mental Status: He is alert.      ED Treatments / Results  Labs (all labs ordered are listed, but only abnormal results are displayed) Labs Reviewed  CBC WITH DIFFERENTIAL/PLATELET - Abnormal; Notable for the following components:      Result Value   RBC 2.01 (*)    Hemoglobin 4.9 (*)    HCT 17.8 (*)    MCH 24.4 (*)    MCHC 27.5 (*)    RDW 16.5 (*)    nRBC 0.4 (*)    Monocytes Absolute 1.1 (*)    All other components within normal limits  COMPREHENSIVE METABOLIC PANEL - Abnormal; Notable for the following components:   Chloride 96 (*)    CO2 35 (*)    Glucose, Bld 101 (*)    Calcium 8.5 (*)    Total Protein 5.7 (*)    Albumin 3.4 (*)    All other components within normal limits  POCT I-STAT EG7 - Abnormal; Notable for the following components:   pH, Ven 7.443 (*)    pCO2, Ven 63.7 (*)    pO2, Ven 103.0 (*)    Bicarbonate 43.6 (*)    TCO2 45 (*)    Acid-Base Excess 18.0 (*)    Calcium, Ion 1.07 (*)    HCT 17.0 (*)    Hemoglobin 5.8 (*)  All other components within normal limits  POC OCCULT BLOOD, ED - Abnormal; Notable for the following components:   Fecal Occult Bld POSITIVE (*)    All other components within normal limits  TROPONIN I  BRAIN  NATRIURETIC PEPTIDE  BASIC METABOLIC PANEL  CBC  PROTIME-INR  APTT  HEMOGLOBIN AND HEMATOCRIT, BLOOD  HEMOGLOBIN AND HEMATOCRIT, BLOOD  HEMOGLOBIN AND HEMATOCRIT, BLOOD  VITAMIN B12  FOLATE  IRON AND TIBC  FERRITIN  RETICULOCYTES  I-STAT VENOUS BLOOD GAS, ED  TYPE AND SCREEN  PREPARE RBC (CROSSMATCH)    EKG EKG Interpretation  Date/Time:  Thursday September 04 2018 16:08:48 EDT Ventricular Rate:  87 PR Interval:    QRS Duration: 102 QT Interval:  347 QTC Calculation: 418 R Axis:   -18 Text Interpretation:  Sinus rhythm Borderline short PR interval Borderline left axis deviation similar to previous Confirmed by Frederick Peers [phone]) on 09/04/2018 4:30:15 PM   Radiology Dg Chest Portable 1 View  Result Date: 09/04/2018 CLINICAL DATA:  Central chest pain. Shortness of breath and cough for 1 week. EXAM: PORTABLE CHEST 1 VIEW COMPARISON:  None. FINDINGS: Cardiac silhouette is normal in size. No mediastinal or hilar masses. No evidence of adenopathy. Lungs are hyperexpanded with mildly prominent bronchovascular and interstitial markings, stable from the prior exam. Lungs are otherwise clear. No pleural effusion or pneumothorax. Skeletal structures are grossly intact. IMPRESSION: 1. No acute cardiopulmonary disease. 2. Lung hyperexpansion consistent with COPD. Electronically Signed   By: Amie Portland M.D.   On: 09/04/2018 16:56    Procedures Procedures (including critical care time)  Medications Ordered in ED Medications  atorvastatin (LIPITOR) tablet 80 mg (has no administration in time range)  carvedilol (COREG) tablet 3.125 mg (has no administration in time range)  gabapentin (NEURONTIN) capsule 300 mg (has no administration in time range)  fluticasone furoate-vilanterol (BREO ELLIPTA) 100-25 MCG/INH 1 puff (has no administration in time range)  dextrose 5 %-0.45 % sodium chloride infusion (has no administration in time range)  acetaminophen (TYLENOL) tablet 650 mg (has no  administration in time range)    Or  acetaminophen (TYLENOL) suppository 650 mg (has no administration in time range)  traZODone (DESYREL) tablet 25 mg (has no administration in time range)  oxyCODONE (Oxy IR/ROXICODONE) immediate release tablet 5 mg (has no administration in time range)  promethazine (PHENERGAN) tablet 12.5 mg (has no administration in time range)  pantoprazole (PROTONIX) injection 40 mg (has no administration in time range)  ipratropium-albuterol (DUONEB) 0.5-2.5 (3) MG/3ML nebulizer solution 3 mL (3 mLs Nebulization Given 09/04/18 1944)  albuterol (PROVENTIL) (2.5 MG/3ML) 0.083% nebulizer solution 2.5 mg (has no administration in time range)  albuterol (PROVENTIL HFA;VENTOLIN HFA) 108 (90 Base) MCG/ACT inhaler 4 puff (4 puffs Inhalation Given 09/04/18 1648)  ipratropium (ATROVENT HFA) inhaler 2 puff (2 puffs Inhalation Given 09/04/18 1750)  methylPREDNISolone sodium succinate (SOLU-MEDROL) 125 mg/2 mL injection 125 mg (125 mg Intravenous Given 09/04/18 1648)  0.9 %  sodium chloride infusion (Manually program via Guardrails IV Fluids) (10 mL/hr Intravenous New Bag/Given 09/04/18 1732)  pantoprazole (PROTONIX) 80 mg in sodium chloride 0.9 % 100 mL IVPB (0 mg Intravenous Stopped 09/04/18 1849)  albuterol (PROVENTIL,VENTOLIN) solution continuous neb (5 mg/hr Nebulization Given by Other 09/04/18 1825)     Initial Impression / Assessment and Plan / ED Course  I have reviewed the triage vital signs and the nursing notes.  Pertinent labs & imaging results that were available during my care of the patient were reviewed by me  and considered in my medical decision making (see chart for details).        Patient is a 63 year old male with extensive past medical history as stated above with frequent trips to the emergency department for dyspnea, who presents emergency department today complaining of worsening shortness of breath with intermittent cough and chest pain for the past week.   Patient received 2 nebulized treatments with EMS in transit and reports improvement in his shortness of breath.  On initial evaluation the patient he was hemodynamically stable and in mild respiratory distress.  Patient was afebrile, not tachycardic, mildly tachypneic, and satting 100% on 3 L nasal cannula which is his baseline.  Physical exam as detailed above which is remarkable for tachypnea with some level of pursed lip breathing.  Patient also has decreased lung sounds throughout with wheezing appreciated in the right base.  No obvious signs of volume overload such as crackles in the lung bases, JVD, or lower extremity edema.  Patient has equal pulses in all 4 extremities which are all warm well perfused.  Clinical picture at this time given decreased breath sounds bilaterally and some wheezing on auscultation, is more concerning for COPD exacerbation as well as possible GI bleed.  We will also obtain BNP, troponin, and chest x-ray to evaluate for potential pneumonia, pulmonary edema, or cardiovascular etiology however physical exam wheezes less likely.  Patient initially provided with, albuterol puffs, and IV solumedrol.  CBC remarkable for hemoglobin of 4.9.  Patient was immediately typed and crossed for 2 units.  Verbal consent was obtained at bedside.  CMP with no significant metabolic or electrolyte derangements.  Blood gas showing 7.44/63/103/45.  Patient hypercarbia appears to be compensated at this time.  Chest x-ray consistent with COPD with hyperinflated lungs however no focal consolidations concerning for pneumonia are present.  Patient also does not appear to have any pulmonary edema.  Part undetectable.  BNP not grossly elevated.  Doubt cardiovascular etiology of the patient symptoms at this time.  Patient's case was discussed with on-call gastroenterologist from [place] who recommends IV Protonix at this time as his last clinic evaluation was concerning for elevated LFTs with  suspicion for chronic alcohol abuse.  Patient is not diagnosed cirrhotic at this time however.  As he is hemodynamically stable with no hemoptysis or ongoing rectal bleeding, do not feel emergent reversal of Xarelto is warranted at this time.  We will plan for metabolism washout.  GI will continue to follow the patient's case while he is admitted to the hospital.  Patient continues to complain of dyspnea, has poor air movement on auscultation, and pursed lip breathing despite inhalers.  As he is in my opinion low risk for COVID-19 with no recent fevers, no known sick contacts, no travel to endemic regions, feel he is safe for nebulized therapies at this time.  He was started on 1 hour continuous albuterol nebulizer while in the emergency department.  Patient case also discussed with hospitalist service who is in agreement with admission at this time for ongoing evaluation and treatment of acute hypoxic respiratory failure from COPD exacerbation and severe anemia 2/2 to GI bleed.  Patient was in stable condition at time of admission.    Final Clinical Impressions(s) / ED Diagnoses   Final diagnoses:  Gastrointestinal hemorrhage, unspecified gastrointestinal hemorrhage type  Severe anemia  COPD exacerbation [facility])    ED Discharge Orders    None       Leonette Monarch, MD 09/04/18 2038  Little, Ambrose Finland, MD 09/04/18 2045

## 2018-09-04 NOTE — Plan of Care (Signed)

## 2018-09-04 NOTE — ED Notes (Signed)
Attempted report 

## 2018-09-04 NOTE — ED Notes (Signed)
ED TO INPATIENT HANDOFF REPORT  ED Nurse Name and Phone #: Maryruth Bun, RN  [phone] Name/Age/Gender Jared [name] 63 y.o. male Room/Bed: 020C/020C  Code Status   Code Status: Full Code  Home/SNF/Other Home Patient oriented to: x 4 Is this baseline? Yes   Triage Complete: Triage complete  Chief Complaint [facility]; COPD  Triage Note Pt presents with 1 week h/o shortness of breath and cough.  Nebs x 2 given PTA; reports mid-chest pain x 1 week; on home O2  @ 3L.  BP: 90/58 with NS infused enroute.    Allergies Allergies  Allergen Reactions  . Lisinopril Cough    Level of Care/Admitting Diagnosis ED Disposition    ED Disposition Condition Comment   Admit  Hospital Area: [facility] [[id number]]  Level of Care: Telemetry Medical [104]  I expect the patient will be discharged within 24 hours: Yes  LOW acuity---Tx typically complete <24 hrs---ACUTE conditions typically can be evaluated <24 hours---LABS likely to return to acceptable levels <24 hours---IS near functional baseline---EXPECTED to return to current living arrangement---NOT newly hypoxic: Does not meet criteria for 5C-Observation unit  Diagnosis: Symptomatic anemia [9798921]  Admitting Physician: Almon Hercules [[id number]]  Attending Physician: Almon Hercules [[id number]]  PT Class (Do Not Modify): Observation [104]  PT Acc Code (Do Not Modify): Observation [10022]       B Medical/Surgery History Past Medical History:  Diagnosis Date  . CAD (coronary artery disease)    a. LHC 5/12:  LAD 20, pLCx 20, pRCA 40, dRCA 40, EF 35%, diff HK  //  b. Myoview 4/16: Overall Impression: High risk stress nuclear study There is no evidence of ischemia. There is severe LV dysfunction. LV Ejection Fraction: 30%. LV Wall Motion: There is global LV hypokinesis.   Marland Kitchen CAP (community acquired pneumonia) 09/2013  . Chronic combined systolic and diastolic CHF (congestive heart failure) (HCC)    a. Echo 4/16:Mild LVH, EF 40-45%,  diffuse HK //  b. Echo 8/17: EF 35-40%, diffuse HK, diastolic dysfunction, aortic sclerosis, trivial MR, moderate LAE, normal RVSF, moderate RAE, mild TR, PASP 42 mmHg // c. Echo 4/18: Mild concentric LVH, EF 30-35, normal wall motion, grade 1 diastolic dysfunction, PASP 49  . Cluster headache    "hx; haven't had one in awhile" (01/09/2016)  . COPD (chronic obstructive pulmonary disease) (HCC)    [name] 01/09/2016  . History of CVA (cerebrovascular accident)   . Hypertension   . NICM (nonischemic cardiomyopathy) (HCC)   . Tobacco abuse    Past Surgical History:  Procedure Laterality Date  . CARDIAC CATHETERIZATION  10/2010   LM normal, LAD with 20% irregularities, LCX with 20%, RCA with 40% prox and 40% distal - EF of 35%  . COLONOSCOPY W/ BIOPSIES AND POLYPECTOMY    . EXCISION MASS HEAD    . INCISION AND DRAINAGE PERIRECTAL ABSCESS N/A 06/05/2017   Procedure: IRRIGATION AND DEBRIDEMENT PERIRECTAL ABSCESS;  Surgeon: Andria Meuse, MD;  Location: [facility] OR;  Service: General;  Laterality: N/A;  . VIDEO BRONCHOSCOPY Bilateral 05/08/2016   Procedure: VIDEO BRONCHOSCOPY WITH FLUORO;  Surgeon: Oretha Milch, MD;  Location: [facility] ENDOSCOPY;  Service: Cardiopulmonary;  Laterality: Bilateral;     A IV Location/Drains/Wounds Patient Lines/Drains/Airways Status   Active Line/Drains/Airways    Name:   Placement date:   Placement time:   Site:   Days:   Peripheral IV 09/04/18 Left Hand   09/04/18    -    Hand  less than 1          Intake/Output Last 24 hours No intake or output data in the 24 hours ending 09/04/18 1908  Labs/Imaging Results for orders placed or performed during the hospital encounter of 09/04/18 (from the past 48 hour(s))  CBC with Differential     Status: Abnormal   Collection Time: 09/04/18  4:29 PM  Result Value Ref Range   WBC 9.3 4.0 - 10.5 K/uL   RBC 2.01 (L) 4.22 - 5.81 MIL/uL   Hemoglobin 4.9 (LL) 13.0 - 17.0 g/dL    Comment: REPEATED TO VERIFY THIS CRITICAL  RESULT HAS VERIFIED AND BEEN CALLED TO J.MARN RN BY KATHERINE MCCORMICK ON 03 26 2020 AT 1650, AND HAS BEEN READ BACK.     HCT 17.8 (L) 39.0 - 52.0 %   MCV 88.6 80.0 - 100.0 fL   MCH 24.4 (L) 26.0 - 34.0 pg   MCHC 27.5 (L) 30.0 - 36.0 g/dL   RDW 50.0 (H) 93.8 - 18.2 %   Platelets 196 150 - 400 K/uL   nRBC 0.4 (H) 0.0 - 0.2 %   Neutrophils Relative % 44 %   Neutro Abs 4.2 1.7 - 7.7 K/uL   Lymphocytes Relative 39 %   Lymphs Abs 3.6 0.7 - 4.0 K/uL   Monocytes Relative 12 %   Monocytes Absolute 1.1 (H) 0.1 - 1.0 K/uL   Eosinophils Relative 5 %   Eosinophils Absolute 0.4 0.0 - 0.5 K/uL   Basophils Relative 0 %   Basophils Absolute 0.0 0.0 - 0.1 K/uL   Immature Granulocytes 0 %   Abs Immature Granulocytes 0.02 0.00 - 0.07 K/uL    Comment: Performed at [facility], [address], [place], [place] [address]  Comprehensive metabolic panel     Status: Abnormal   Collection Time: 09/04/18  4:29 PM  Result Value Ref Range   Sodium 139 135 - 145 mmol/L   Potassium 4.6 3.5 - 5.1 mmol/L    Comment: HEMOLYSIS AT THIS LEVEL MAY AFFECT RESULT   Chloride 96 (L) 98 - 111 mmol/L   CO2 35 (H) 22 - 32 mmol/L   Glucose, Bld 101 (H) 70 - 99 mg/dL   BUN 16 8 - 23 mg/dL   Creatinine, Ser 6.96 0.61 - 1.24 mg/dL   Calcium 8.5 (L) 8.9 - 10.3 mg/dL   Total Protein 5.7 (L) 6.5 - 8.1 g/dL   Albumin 3.4 (L) 3.5 - 5.0 g/dL   AST 34 15 - 41 U/L   ALT 16 0 - 44 U/L   Alkaline Phosphatase 46 38 - 126 U/L   Total Bilirubin 0.7 0.3 - 1.2 mg/dL   GFR calc non Af Amer >60 >60 mL/min   GFR calc Af Amer >60 >60 mL/min   Anion gap 8 5 - 15    Comment: Performed at [facility], [address] [place], [place] [address]  Troponin I - ONCE - STAT     Status: None   Collection Time: 09/04/18  4:29 PM  Result Value Ref Range   Troponin I <0.03 <0.03 ng/mL    Comment: Performed at [facility], [address] [place], [place] [address]  Brain natriuretic peptide     Status: None   Collection  Time: 09/04/18  4:29 PM  Result Value Ref Range   B Natriuretic Peptide 75.2 0.0 - 100.0 pg/mL    Comment: Performed at [facility], [address], [place], [place] [address]  POCT I-Stat EG7     Status: Abnormal   Collection Time: 09/04/18  4:46 PM  Result Value Ref Range   pH, Ven 7.443 (H) 7.250 - 7.430   pCO2, Ven 63.7 (H) 44.0 - 60.0 mmHg   pO2, Ven 103.0 (H) 32.0 - 45.0 mmHg   Bicarbonate 43.6 (H) 20.0 - 28.0 mmol/L   TCO2 45 (H) 22 - 32 mmol/L   O2 Saturation 98.0 %   Acid-Base Excess 18.0 (H) 0.0 - 2.0 mmol/L   Sodium 139 135 - 145 mmol/L   Potassium 4.5 3.5 - 5.1 mmol/L   Calcium, Ion 1.07 (L) 1.15 - 1.40 mmol/L   HCT 17.0 (L) 39.0 - 52.0 %   Hemoglobin 5.8 (LL) 13.0 - 17.0 g/dL   Patient temperature HIDE    Sample type VENOUS    Comment NOTIFIED PHYSICIAN   Type and screen [facility]     Status: None (Preliminary result)   Collection Time: 09/04/18  5:23 PM  Result Value Ref Range   ABO/RH(D) O POS    Antibody Screen NEG    Sample Expiration 09/07/2018    Unit Number [id number]    Blood Component Type RED CELLS,LR    Unit division 00    Status of Unit ISSUED    Transfusion Status OK TO TRANSFUSE    Crossmatch Result      Compatible Performed at [facility], [address], [place], [place] [address]    Unit Number [id number]    Blood Component Type RBC LR PHER1    Unit division 00    Status of Unit ALLOCATED    Transfusion Status OK TO TRANSFUSE    Crossmatch Result Compatible   Prepare RBC     Status: None   Collection Time: 09/04/18  5:23 PM  Result Value Ref Range   Order Confirmation      ORDER PROCESSED BY BLOOD BANK Performed at [facility], [address] [place], [place] [address]   POC occult blood, ED Provider will collect     Status: Abnormal   Collection Time: 09/04/18  6:08 PM  Result Value Ref Range   Fecal Occult Bld POSITIVE (A) NEGATIVE   Dg Chest Portable 1 View  Result Date:  09/04/2018 CLINICAL DATA:  Central chest pain. Shortness of breath and cough for 1 week. EXAM: PORTABLE CHEST 1 VIEW COMPARISON:  None. FINDINGS: Cardiac silhouette is normal in size. No mediastinal or hilar masses. No evidence of adenopathy. Lungs are hyperexpanded with mildly prominent bronchovascular and interstitial markings, stable from the prior exam. Lungs are otherwise clear. No pleural effusion or pneumothorax. Skeletal structures are grossly intact. IMPRESSION: 1. No acute cardiopulmonary disease. 2. Lung hyperexpansion consistent with COPD. Electronically Signed   By: Amie Portland M.D.   On: 09/04/2018 16:56    Pending Labs Unresulted Labs (From admission, onward)    Start     Ordered   09/05/18 0500  Basic metabolic panel  Tomorrow morning,   R     09/04/18 1846   09/05/18 0500  CBC  Tomorrow morning,   R     09/04/18 1846   09/05/18 0500  Protime-INR  Tomorrow morning,   R     09/04/18 1846   09/05/18 0500  APTT  Tomorrow morning,   R     09/04/18 1846   09/04/18 1848  Vitamin B12  (Anemia Panel (PNL))  Once,   R  09/04/18 1847   09/04/18 1848  Folate  (Anemia Panel (PNL))  Once,   R     09/04/18 1847   09/04/18 1848  Iron and TIBC  (Anemia Panel (PNL))  Once,   R     09/04/18 1847   09/04/18 1848  Ferritin  (Anemia Panel (PNL))  Once,   R     09/04/18 1847   09/04/18 1848  Reticulocytes  (Anemia Panel (PNL))  Once,   R     09/04/18 1847   09/04/18 1847  Hemoglobin and hematocrit, blood  Now then every 4 hours,   R     09/04/18 1846          Vitals/Pain Today's Vitals   09/04/18 1732 09/04/18 1829 09/04/18 1832 09/04/18 1905  BP: 113/64 (!) 119/59 (!) 119/59 120/71  Pulse: 81 82 82 87  Resp: Temp:  98.1 F (36.7 C) 98.1 F (36.7 C) 99.3 F (37.4 C)  TempSrc:   Oral Oral  SpO2: 100% 100% 100% 100%  Weight:      Height:      PainSc: 3  3       Isolation Precautions No active isolations  Medications Medications  atorvastatin (LIPITOR)  tablet 80 mg (has no administration in time range)  carvedilol (COREG) tablet 3.125 mg (has no administration in time range)  gabapentin (NEURONTIN) capsule 300 mg (has no administration in time range)  fluticasone furoate-vilanterol (BREO ELLIPTA) 100-25 MCG/INH 1 puff (has no administration in time range)  dextrose 5 %-0.45 % sodium chloride infusion (has no administration in time range)  acetaminophen (TYLENOL) tablet 650 mg (has no administration in time range)    Or  acetaminophen (TYLENOL) suppository 650 mg (has no administration in time range)  traZODone (DESYREL) tablet 25 mg (has no administration in time range)  oxyCODONE (Oxy IR/ROXICODONE) immediate release tablet 5 mg (has no administration in time range)  promethazine (PHENERGAN) tablet 12.5 mg (has no administration in time range)  pantoprazole (PROTONIX) injection 40 mg (has no administration in time range)  albuterol (PROVENTIL HFA;VENTOLIN HFA) 108 (90 Base) MCG/ACT inhaler 4 puff (4 puffs Inhalation Given 09/04/18 1648)  ipratropium (ATROVENT HFA) inhaler 2 puff (2 puffs Inhalation Given 09/04/18 1750)  methylPREDNISolone sodium succinate (SOLU-MEDROL) 125 mg/2 mL injection 125 mg (125 mg Intravenous Given 09/04/18 1648)  0.9 %  sodium chloride infusion (Manually program via Guardrails IV Fluids) (10 mL/hr Intravenous New Bag/Given 09/04/18 1732)  pantoprazole (PROTONIX) 80 mg in sodium chloride 0.9 % 100 mL IVPB (0 mg Intravenous Stopped 09/04/18 1849)  albuterol (PROVENTIL,VENTOLIN) solution continuous neb (5 mg/hr Nebulization Given by Other 09/04/18 1825)    Mobility walks Low fall risk   Focused Assessments Pulmonary Assessment Handoff:  Lung sounds: Bilateral Breath Sounds: Diminished, Expiratory wheezes L Breath Sounds: Clear R Breath Sounds: Diminished O2 Device: Aerosol Mask O2 Flow Rate (L/min): 3 L/min      R Recommendations: See Admitting Provider Note  Report given to:   Additional Notes:

## 2018-09-05 ENCOUNTER — Encounter (HOSPITAL_COMMUNITY)
Admission: EM | Disposition: A | Discharge status: Home / Self Care | Payer: Self-pay | Source: Home / Self Care | Attending: Internal Medicine

## 2018-09-05 ENCOUNTER — Encounter (HOSPITAL_COMMUNITY): Payer: Self-pay

## 2018-09-05 ENCOUNTER — Observation Stay (HOSPITAL_COMMUNITY): Payer: Medicaid Other | Admitting: Certified Registered Nurse Anesthetist

## 2018-09-05 DIAGNOSIS — Z888 Allergy status to other drugs, medicaments and biological substances status: Secondary | ICD-10-CM | POA: Diagnosis not present

## 2018-09-05 DIAGNOSIS — Z79891 Long term (current) use of opiate analgesic: Secondary | ICD-10-CM | POA: Diagnosis not present

## 2018-09-05 DIAGNOSIS — I480 Paroxysmal atrial fibrillation: Secondary | ICD-10-CM | POA: Diagnosis present

## 2018-09-05 DIAGNOSIS — Z800 Family history of malignant neoplasm of digestive organs: Secondary | ICD-10-CM | POA: Diagnosis not present

## 2018-09-05 DIAGNOSIS — I428 Other cardiomyopathies: Secondary | ICD-10-CM | POA: Diagnosis present

## 2018-09-05 DIAGNOSIS — I4892 Unspecified atrial flutter: Secondary | ICD-10-CM | POA: Diagnosis not present

## 2018-09-05 DIAGNOSIS — Z8249 Family history of ischemic heart disease and other diseases of the circulatory system: Secondary | ICD-10-CM | POA: Diagnosis not present

## 2018-09-05 DIAGNOSIS — D649 Anemia, unspecified: Secondary | ICD-10-CM | POA: Diagnosis not present

## 2018-09-05 DIAGNOSIS — K210 Gastro-esophageal reflux disease with esophagitis: Secondary | ICD-10-CM | POA: Diagnosis present

## 2018-09-05 DIAGNOSIS — D509 Iron deficiency anemia, unspecified: Secondary | ICD-10-CM

## 2018-09-05 DIAGNOSIS — J441 Chronic obstructive pulmonary disease with (acute) exacerbation: Secondary | ICD-10-CM | POA: Diagnosis present

## 2018-09-05 DIAGNOSIS — F17210 Nicotine dependence, cigarettes, uncomplicated: Secondary | ICD-10-CM | POA: Diagnosis present

## 2018-09-05 DIAGNOSIS — K449 Diaphragmatic hernia without obstruction or gangrene: Secondary | ICD-10-CM | POA: Diagnosis present

## 2018-09-05 DIAGNOSIS — Z7951 Long term (current) use of inhaled steroids: Secondary | ICD-10-CM | POA: Diagnosis not present

## 2018-09-05 DIAGNOSIS — Z7901 Long term (current) use of anticoagulants: Secondary | ICD-10-CM | POA: Diagnosis not present

## 2018-09-05 DIAGNOSIS — R194 Change in bowel habit: Secondary | ICD-10-CM | POA: Diagnosis not present

## 2018-09-05 DIAGNOSIS — K922 Gastrointestinal hemorrhage, unspecified: Secondary | ICD-10-CM | POA: Diagnosis not present

## 2018-09-05 DIAGNOSIS — D62 Acute posthemorrhagic anemia: Secondary | ICD-10-CM | POA: Diagnosis present

## 2018-09-05 DIAGNOSIS — K921 Melena: Secondary | ICD-10-CM | POA: Diagnosis present

## 2018-09-05 DIAGNOSIS — Z560 Unemployment, unspecified: Secondary | ICD-10-CM | POA: Diagnosis not present

## 2018-09-05 DIAGNOSIS — K209 Esophagitis, unspecified: Secondary | ICD-10-CM | POA: Diagnosis not present

## 2018-09-05 DIAGNOSIS — Z79899 Other long term (current) drug therapy: Secondary | ICD-10-CM | POA: Diagnosis not present

## 2018-09-05 DIAGNOSIS — K648 Other hemorrhoids: Secondary | ICD-10-CM | POA: Diagnosis present

## 2018-09-05 DIAGNOSIS — I2510 Atherosclerotic heart disease of native coronary artery without angina pectoris: Secondary | ICD-10-CM | POA: Diagnosis present

## 2018-09-05 DIAGNOSIS — I10 Essential (primary) hypertension: Secondary | ICD-10-CM | POA: Diagnosis not present

## 2018-09-05 DIAGNOSIS — I110 Hypertensive heart disease with heart failure: Secondary | ICD-10-CM | POA: Diagnosis present

## 2018-09-05 DIAGNOSIS — J9611 Chronic respiratory failure with hypoxia: Secondary | ICD-10-CM | POA: Diagnosis present

## 2018-09-05 DIAGNOSIS — D500 Iron deficiency anemia secondary to blood loss (chronic): Secondary | ICD-10-CM

## 2018-09-05 DIAGNOSIS — Z8673 Personal history of transient ischemic attack (TIA), and cerebral infarction without residual deficits: Secondary | ICD-10-CM | POA: Diagnosis not present

## 2018-09-05 DIAGNOSIS — Z9981 Dependence on supplemental oxygen: Secondary | ICD-10-CM | POA: Diagnosis not present

## 2018-09-05 DIAGNOSIS — J449 Chronic obstructive pulmonary disease, unspecified: Secondary | ICD-10-CM | POA: Diagnosis present

## 2018-09-05 DIAGNOSIS — I5042 Chronic combined systolic (congestive) and diastolic (congestive) heart failure: Secondary | ICD-10-CM | POA: Diagnosis present

## 2018-09-05 HISTORY — PX: ESOPHAGOGASTRODUODENOSCOPY (EGD) WITH PROPOFOL: SHX5813

## 2018-09-05 LAB — BASIC METABOLIC PANEL
Anion gap: 9 (ref 5–15)
BUN: 13 mg/dL (ref 8–23)
CO2: 36 mmol/L — ABNORMAL HIGH (ref 22–32)
Calcium: 8.8 mg/dL — ABNORMAL LOW (ref 8.9–10.3)
Chloride: 96 mmol/L — ABNORMAL LOW (ref 98–111)
Creatinine, Ser: 0.82 mg/dL (ref 0.61–1.24)
GFR calc Af Amer: >60 mL/min (ref >60)
GFR calc non Af Amer: >60 mL/min (ref >60)
Glucose, Bld: 171 mg/dL — ABNORMAL HIGH (ref 70–99)
Potassium: 4.3 mmol/L (ref 3.5–5.1)
Sodium: 141 mmol/L (ref 135–145)

## 2018-09-05 LAB — CBC
HCT: 20.5 % — ABNORMAL LOW (ref 39.0–52.0)
Hemoglobin: 6 g/dL — CL (ref 13.0–17.0)
MCH: 25 pg — ABNORMAL LOW (ref 26.0–34.0)
MCHC: 29.3 g/dL — ABNORMAL LOW (ref 30.0–36.0)
MCV: 85.4 fL (ref 80.0–100.0)
Platelets: 174 10*3/uL (ref 150–400)
RBC: 2.4 MIL/uL — ABNORMAL LOW (ref 4.22–5.81)
RDW: 15.7 % — ABNORMAL HIGH (ref 11.5–15.5)
WBC: 7.9 10*3/uL (ref 4.0–10.5)
nRBC: 0.4 % — ABNORMAL HIGH (ref 0.0–0.2)

## 2018-09-05 LAB — FOLATE: Folate: 20.5 ng/mL (ref >5.9)

## 2018-09-05 LAB — HEMOGLOBIN AND HEMATOCRIT, BLOOD
HCT: 22.7 % — ABNORMAL LOW (ref 39.0–52.0)
HCT: 27.4 % — ABNORMAL LOW (ref 39.0–52.0)
Hemoglobin: 6.8 g/dL — CL (ref 13.0–17.0)
Hemoglobin: 8.3 g/dL — ABNORMAL LOW (ref 13.0–17.0)

## 2018-09-05 LAB — RETICULOCYTES
Immature Retic Fract: 16.5 % — ABNORMAL HIGH (ref 2.3–15.9)
RBC.: 2.4 MIL/uL — ABNORMAL LOW (ref 4.22–5.81)
Retic Count, Absolute: 58.4 10*3/uL (ref 19.0–186.0)
Retic Ct Pct: 2.5 % (ref 0.4–3.1)

## 2018-09-05 LAB — PROTIME-INR
INR: 1.6 — ABNORMAL HIGH (ref 0.8–1.2)
Prothrombin Time: 18.4 seconds — ABNORMAL HIGH (ref 11.4–15.2)

## 2018-09-05 LAB — IRON AND TIBC
Iron: 9 ug/dL — ABNORMAL LOW (ref 45–182)
Saturation Ratios: 3 % — ABNORMAL LOW (ref 17.9–39.5)
TIBC: 358 ug/dL (ref 250–450)
UIBC: 349 ug/dL

## 2018-09-05 LAB — APTT: aPTT: 35 seconds (ref 24–36)

## 2018-09-05 LAB — FERRITIN: Ferritin: 3 ng/mL — ABNORMAL LOW (ref 24–336)

## 2018-09-05 LAB — PREPARE RBC (CROSSMATCH)

## 2018-09-05 LAB — VITAMIN B12: Vitamin B-12: 576 pg/mL (ref 180–914)

## 2018-09-05 SURGERY — ESOPHAGOGASTRODUODENOSCOPY (EGD) WITH PROPOFOL
Anesthesia: General

## 2018-09-05 MED ORDER — LACTATED RINGERS IV SOLN
INTRAVENOUS | Status: DC
  Administered 2018-09-05: 10:00:00 via INTRAVENOUS

## 2018-09-05 MED ORDER — BISACODYL 5 MG PO TBEC
20.0000 mg | DELAYED_RELEASE_TABLET | Freq: Once | ORAL | Status: AC
  Administered 2018-09-05: 20 mg via ORAL
  Filled 2018-09-05: qty 4

## 2018-09-05 MED ORDER — PROPOFOL 10 MG/ML IV BOLUS
INTRAVENOUS | Status: DC | PRN
  Administered 2018-09-05: 130 mg via INTRAVENOUS

## 2018-09-05 MED ORDER — SUCCINYLCHOLINE CHLORIDE 200 MG/10ML IV SOSY
PREFILLED_SYRINGE | INTRAVENOUS | Status: DC | PRN
  Administered 2018-09-05: 120 mg via INTRAVENOUS

## 2018-09-05 MED ORDER — PEG-KCL-NACL-NASULF-NA ASC-C 100 G PO SOLR
1.0000 | Freq: Once | ORAL | Status: DC
  Filled 2018-09-05: qty 1

## 2018-09-05 MED ORDER — BISACODYL 5 MG PO TBEC: 20.0000 mg | DELAYED_RELEASE_TABLET | Freq: Once | ORAL | Status: DC

## 2018-09-05 MED ORDER — FUROSEMIDE 10 MG/ML IJ SOLN
40.0000 mg | Freq: Once | INTRAMUSCULAR | Status: AC
  Administered 2018-09-05: 40 mg via INTRAVENOUS

## 2018-09-05 MED ORDER — METOCLOPRAMIDE HCL 5 MG/ML IJ SOLN
10.0000 mg | Freq: Once | INTRAMUSCULAR | Status: AC
  Administered 2018-09-05: 10 mg via INTRAVENOUS
  Filled 2018-09-05: qty 2

## 2018-09-05 MED ORDER — LIDOCAINE 2% (20 MG/ML) 5 ML SYRINGE
INTRAMUSCULAR | Status: DC | PRN
  Administered 2018-09-05: 50 mg via INTRAVENOUS

## 2018-09-05 MED ORDER — METOCLOPRAMIDE HCL 5 MG/ML IJ SOLN
10.0000 mg | Freq: Once | INTRAMUSCULAR | Status: AC
  Administered 2018-09-06: 10 mg via INTRAVENOUS
  Filled 2018-09-05: qty 2

## 2018-09-05 MED ORDER — ONDANSETRON HCL 4 MG/2ML IJ SOLN
INTRAMUSCULAR | Status: DC | PRN
  Administered 2018-09-05: 4 mg via INTRAVENOUS

## 2018-09-05 MED ORDER — PEG-KCL-NACL-NASULF-NA ASC-C 100 G PO SOLR
0.5000 | Freq: Once | ORAL | Status: AC
  Administered 2018-09-05: 100 g via ORAL
  Filled 2018-09-05: qty 1

## 2018-09-05 MED ORDER — PEG-KCL-NACL-NASULF-NA ASC-C 100 G PO SOLR
0.5000 | Freq: Once | ORAL | Status: AC
  Administered 2018-09-06: 100 g via ORAL

## 2018-09-05 MED ORDER — SODIUM CHLORIDE 0.9% IV SOLUTION: Freq: Once | INTRAVENOUS | Status: DC

## 2018-09-05 MED ORDER — PANTOPRAZOLE SODIUM 40 MG PO TBEC
40.0000 mg | DELAYED_RELEASE_TABLET | Freq: Every day | ORAL | Status: DC
  Administered 2018-09-06 – 2018-09-07 (×2): 40 mg via ORAL
  Filled 2018-09-05 (×2): qty 1

## 2018-09-05 SURGICAL SUPPLY — 15 items

## 2018-09-05 NOTE — Progress Notes (Signed)
Patient ID: Jared [name], male   DOB: 01-21-1956, 63 y.o.   MRN: [id number]  PROGRESS NOTE    Idiris Capano  ASU:[id number] DOB: 04-Jun-1956 DOA: 09/04/2018 PCP: [name], MD   Brief Narrative:  63 year old male with history of COPD, chronic hypoxic respiratory failure on 3 L oxygen via nasal cannula, CAD, combined CHF, CVA without residual deficit, hypertension, tobacco use disorder, atrial flutter on Xarelto presented with shortness of breath for 3 days along with dark stools for 2 weeks.  His hemoglobin was found to be 4.9 (was 14 in 05/2018) and FOBT was positive.  He was started on IV Protonix.  GI was consulted.  He was transfused with packed red cells.  Assessment & Plan:   Principal Problem:   Symptomatic anemia Active Problems:   Essential hypertension   CAD (coronary artery disease)   COPD with acute exacerbation (HCC)   Atrial flutter (HCC)   Tobacco use disorder   Symptomatic anemia due to upper GI bleeding -Presented with hemoglobin of 4.9 with dark stools and FOBT positive.   -Currently third unit of packed red cells is being transfused.  Hemoglobin this morning was 6. -GI consultation is pending.  Continue Protonix.  Monitor H&H.  N.p.o.  Xarelto on hold.  Chronic COPD with chronic hypoxic respiratory failure -Doubt that patient has COPD exacerbation at this time.  Dyspnea is probably secondary to anemia -Continue Breo Ellipta and DuoNeb.  Currently not wheezing. -Currently on 3 L oxygen via nasal cannula which is his home oxygen requirement.  Outpatient follow-up with pulmonary  Atypical chest pain -Likely due to anemia.  Troponin negative.  EKG was reassuring  Chronic combined CHF -Echo in 04/2018 showed EF of 45 to 50% with grade 1 diastolic dysfunction -Appears euvolemic -Lasix on hold.  Give some Lasix in between transfusions -Strict input and output with daily weights. -Continue Coreg.  Losartan on hold.  Paroxysmal atrial flutter -Currently rate  controlled and in sinus rhythm.  Xarelto on hold.  Continue Coreg.  Outpatient follow-up with cardiology  DVT prophylaxis: SCDs Code Status: Full Family Communication: None at bedside Disposition Plan: Home once cleared by GI  Consultants: GI Procedures: None  Antimicrobials: None   Subjective: Patient seen and examined at bedside.  He denies any worsening chest pain, shortness of breath.  No current nausea, vomiting or fevers.  Objective: Vitals:   09/05/18 0216 09/05/18 0446 09/05/18 0636 09/05/18 0700  BP:  129/76 (!) 141/76 133/76  Pulse:  74 73 74  Resp:  18 18 18   Temp:  98.2 F (36.8 C) 98.2 F (36.8 C) 98.2 F (36.8 C)  TempSrc:  Oral Oral Oral  SpO2: 98% 100% 93%   Weight:      Height:        Intake/Output Summary (Last 24 hours) at 09/05/2018 0818 Last data filed at 09/05/2018 0310 Gross per 24 hour  Intake 656 ml  Output 250 ml  Net 406 ml   Filed Weights   09/04/18 1615 09/04/18 2026  Weight: 94.3 kg 91.8 kg    Examination:  General exam: Appears calm and comfortable  Respiratory system: Bilateral decreased breath sounds at bases with some scattered crackles.  No wheezing Cardiovascular system: S1 & S2 heard, Rate controlled Gastrointestinal system: Abdomen is nondistended, soft and nontender. Normal bowel sounds heard. Extremities: No cyanosis, clubbing; trace edema   Data Reviewed: I have personally reviewed following labs and imaging studies  CBC: Recent Labs  Lab 09/04/18 1629 09/04/18 1646 09/05/18 0253  WBC 9.3  --  7.9  NEUTROABS 4.2  --   --   HGB 4.9* 5.8* 6.0*  HCT 17.8* 17.0* 20.5*  MCV 88.6  --  85.4  PLT 196  --  174   Basic Metabolic Panel: Recent Labs  Lab 09/04/18 1629 09/04/18 1646 09/05/18 0253  NA 139 139 141  K 4.6 4.5 4.3  CL 96*  --  96*  CO2 35*  --  36*  GLUCOSE 101*  --  171*  BUN 16  --  13  CREATININE 0.95  --  0.82  CALCIUM 8.5*  --  8.8*   GFR: Estimated Creatinine Clearance: 108.8 mL/min (by  C-G formula based on SCr of 0.82 mg/dL). Liver Function Tests: Recent Labs  Lab 09/04/18 1629  AST 34  ALT 16  ALKPHOS 46  BILITOT 0.7  PROT 5.7*  ALBUMIN 3.4*   No results for input(s): LIPASE, AMYLASE in the last 168 hours. No results for input(s): AMMONIA in the last 168 hours. Coagulation Profile: Recent Labs  Lab 09/05/18 0253  INR 1.6*   Cardiac Enzymes: Recent Labs  Lab 09/04/18 1629  TROPONINI <0.03   BNP (last 3 results) No results for input(s): PROBNP in the last 8760 hours. HbA1C: No results for input(s): HGBA1C in the last 72 hours. CBG: No results for input(s): GLUCAP in the last 168 hours. Lipid Profile: No results for input(s): CHOL, HDL, LDLCALC, TRIG, CHOLHDL, LDLDIRECT in the last 72 hours. Thyroid Function Tests: No results for input(s): TSH, T4TOTAL, FREET4, T3FREE, THYROIDAB in the last 72 hours. Anemia Panel: Recent Labs    09/05/18 0253  VITAMINB12 576  FOLATE 20.5  FERRITIN 3*  TIBC 358  IRON 9*  RETICCTPCT 2.5   Sepsis Labs: No results for input(s): PROCALCITON, LATICACIDVEN in the last 168 hours.  No results found for this or any previous visit (from the past 240 hour(s)).       Radiology Studies: Dg Chest Portable 1 View  Result Date: 09/04/2018 CLINICAL DATA:  Central chest pain. Shortness of breath and cough for 1 week. EXAM: PORTABLE CHEST 1 VIEW COMPARISON:  None. FINDINGS: Cardiac silhouette is normal in size. No mediastinal or hilar masses. No evidence of adenopathy. Lungs are hyperexpanded with mildly prominent bronchovascular and interstitial markings, stable from the prior exam. Lungs are otherwise clear. No pleural effusion or pneumothorax. Skeletal structures are grossly intact. IMPRESSION: 1. No acute cardiopulmonary disease. 2. Lung hyperexpansion consistent with COPD. Electronically Signed   By: Amie Portland M.D.   On: 09/04/2018 16:56        Scheduled Meds: . sodium chloride   Intravenous Once  .  atorvastatin  80 mg Oral Daily  . carvedilol  3.125 mg Oral BID WC  . fluticasone furoate-vilanterol  1 puff Inhalation Daily  . gabapentin  300 mg Oral Daily  . ipratropium-albuterol  3 mL Nebulization Q6H  . pantoprazole (PROTONIX) IV  40 mg Intravenous Q12H   Continuous Infusions: . dextrose 5 % and 0.45% NaCl 75 mL/hr at 09/05/18 0310     LOS: 0 days        Glade Lloyd, MD [facility] 09/05/2018, 8:18 AM

## 2018-09-05 NOTE — TOC Initial Note (Addendum)
Transition of Care [facility]) - Initial/Assessment Note    Patient Details  Name: Jared [name] MRN: [id number] Date of Birth: [birth year]  [facility] CM/SW Contact:    Epifanio Lesches, RN Phone Number: 09/05/2018, 1:55 PM  Clinical Narrative:   Pt from boarding home. Admitted with anemia. States prior to current illness, independent with ADL's. Home oxygen/ [employer].  States sister [name], [place].               Tinnie Gens (Sister)  Pt's cell #   [phone]  [phone]    PCP: [facility] / Dr. Alvis Lemmings  Expected Discharge Plan: Home w Home Health Services Barriers to Discharge: Continued Medical Work up   Patient Goals and CMS Choice Patient states their goals for this hospitalization and ongoing recovery are:: to be able to cook, get groceries, increase my strength CMS Medicare.gov Compare Post Acute Care list provided to:: Patient Choice offered to / list presented to : Patient  Expected Discharge Plan and Services Expected Discharge Plan: Home w Home Health Services In-house Referral: Clinical Social Work(? transportation to home), pt states could possibly need transportation to home 2/2 sister not availiable, working Discharge Planning Services: CM Consult, Follow-up appt scheduled, [facility] Post Acute Care Choice: Home Health Living arrangements for the past 2 months: Boarding House                       [facility] Agency: [facility] if need for home healthy services. PT evaluation pending....  Prior Living Arrangements/Services Living arrangements for the past 2 months: Boarding House   Patient language and need for interpreter reviewed:: Yes Do you feel safe going back to the place where you live?: Yes      Need for Family Participation in Patient Care: No (Comment) Care giver support system in place?: No (comment)      Activities of Daily Living Home Assistive Devices/Equipment: None ADL Screening  (condition at time of admission) Patient's cognitive ability adequate to safely complete daily activities?: Yes Is the patient deaf or have difficulty hearing?: Yes Does the patient have difficulty seeing, even when wearing glasses/contacts?: Yes Does the patient have difficulty concentrating, remembering, or making decisions?: No Patient able to express need for assistance with ADLs?: Yes Does the patient have difficulty dressing or bathing?: Yes Independently performs ADLs?: Yes (appropriate for developmental age) Does the patient have difficulty walking or climbing stairs?: Yes Weakness of Legs: Both Weakness of Arms/Hands: Both  Permission Sought/Granted Permission sought to share information with : Case Manager, Family Supports Permission granted to share information with : Yes, Verbal Permission Granted  Share Information with NAME: Tinnie Gens (Sister)           Emotional Assessment Appearance:: Appears stated age Attitude/Demeanor/Rapport: Engaged Affect (typically observed): Accepting Orientation: : Oriented to Self, Oriented to Place, Oriented to  Time, Oriented to Situation   Psych Involvement: No (comment)  Admission diagnosis:  COPD exacerbation (HCC) [J44.1] Severe anemia [D64.9] Gastrointestinal hemorrhage, unspecified gastrointestinal hemorrhage type [K92.2] Patient Active Problem List   Diagnosis Date Noted  . Iron deficiency anemia   . Symptomatic anemia 09/04/2018  . Tobacco use disorder 09/04/2018  . Noncompliance 06/14/2017  . Elevated LFTs 08/13/2016  . Chronic respiratory failure with hypoxia (HCC) 07/24/2016  . Atrial flutter (HCC)   . Hepatic congestion 07/02/2016  . Chronic combined systolic and diastolic CHF (congestive heart failure) (HCC) 07/01/2016  . COPD with acute exacerbation (  HCC) 06/03/2016  . Prediabetes 05/14/2016  . Hemoptysis 05/06/2016  . COPD GOLD III/still smoking  02/29/2016  . Cigarette smoker 01/09/2016  . CAD  (coronary artery disease) 10/07/2013  . 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 Centrilobular emphysema (HCC) 10/04/2013  . Pulmonary infiltrate in right lung on CXR 10/03/2013  . Acute on chronic respiratory failure with hypoxia (HCC) 10/03/2013  . Essential hypertension    PCP:  [name], MD Pharmacy:   [facility] [place], [place] - [address] [place] [address] Phone: [phone] Fax: [phone]  [facility] [place], [place] - [address] [place] [address] Phone: [phone] Fax: [phone]     Social Determinants of Health (SDOH) Interventions    Readmission Risk Interventions No flowsheet data found.

## 2018-09-05 NOTE — Progress Notes (Signed)
CRITICAL VALUE ALERT  Critical Value:  6.8  Date & Time Notied:  09/05/2018 @ 1033  Provider Notified: MD notified  Orders Received/Actions taken: Orders to administer last transfusion.

## 2018-09-05 NOTE — Anesthesia Procedure Notes (Signed)
Procedure Name: Intubation Date/Time: 09/05/2018 11:22 AM Performed by: Mayer Camel, CRNA Pre-anesthesia Checklist: Patient identified, Emergency Drugs available, Suction available and Patient being monitored Patient Re-evaluated:Patient Re-evaluated prior to induction Oxygen Delivery Method: Circle System Utilized Preoxygenation: Pre-oxygenation with 100% oxygen Induction Type: IV induction and Rapid sequence Ventilation: Mask ventilation without difficulty Laryngoscope Size: Miller and 2 Grade View: Grade I Tube type: Oral Tube size: 7.5 mm Number of attempts: 1 Airway Equipment and Method: Stylet and Oral airway Placement Confirmation: ETT inserted through vocal cords under direct vision,  positive ETCO2 and breath sounds checked- equal and bilateral Secured at: 22 cm Tube secured with: Tape Dental Injury: Teeth and Oropharynx as per pre-operative assessment

## 2018-09-05 NOTE — Anesthesia Postprocedure Evaluation (Signed)
Anesthesia Post Note  Patient: Jared [name]  Procedure(s) Performed: ESOPHAGOGASTRODUODENOSCOPY (EGD) WITH PROPOFOL (N/A )     Patient location during evaluation: Endoscopy Anesthesia Type: General Level of consciousness: awake and alert Pain management: pain level controlled Vital Signs Assessment: post-procedure vital signs reviewed and stable Respiratory status: spontaneous breathing, nonlabored ventilation, respiratory function stable and patient connected to nasal cannula oxygen Cardiovascular status: blood pressure returned to baseline and stable Postop Assessment: no apparent nausea or vomiting Anesthetic complications: no    Last Vitals:  Vitals:   09/05/18 1244 09/05/18 1358  BP: 111/81   Pulse: 73   Resp: 20   Temp: 36.9 C   SpO2: 94% 93%    Last Pain:  Vitals:   09/05/18 1244  TempSrc: Oral  PainSc:                  Kaisei Gilbo S

## 2018-09-05 NOTE — Anesthesia Preprocedure Evaluation (Signed)
Anesthesia Evaluation  Patient identified by MRN, date of birth, ID band Patient awake    Reviewed: Allergy & Precautions, H&P , NPO status , Patient's Chart, lab work & pertinent test results  Airway Mallampati: II   Neck ROM: full    Dental   Pulmonary COPD, Current Smoker,    breath sounds clear to auscultation       Cardiovascular hypertension, +CHF   Rhythm:regular Rate:Normal  NICM. Takes Xarelto   Neuro/Psych  Headaches, CVA    GI/Hepatic GI bleeding   Endo/Other    Renal/GU      Musculoskeletal   Abdominal   Peds  Hematology  (+) Blood dyscrasia, anemia ,   Anesthesia Other Findings   Reproductive/Obstetrics                             Anesthesia Physical Anesthesia Plan  ASA: III  Anesthesia Plan: General   Post-op Pain Management:    Induction: Intravenous  PONV Risk Score and Plan: 1 and Ondansetron, Dexamethasone and Treatment may vary due to age or medical condition  Airway Management Planned: Oral ETT  Additional Equipment:   Intra-op Plan:   Post-operative Plan: Extubation in OR  Informed Consent: I have reviewed the patients History and Physical, chart, labs and discussed the procedure including the risks, benefits and alternatives for the proposed anesthesia with the patient or authorized representative who has indicated his/her understanding and acceptance.       Plan Discussed with: CRNA and Anesthesiologist  Anesthesia Plan Comments:         Anesthesia Quick Evaluation

## 2018-09-05 NOTE — Transfer of Care (Signed)
Immediate Anesthesia Transfer of Care Note  Patient: Jared [name]  Procedure(s) Performed: ESOPHAGOGASTRODUODENOSCOPY (EGD) WITH PROPOFOL (N/A )  Patient Location: Endoscopy Unit  Anesthesia Type:General  Level of Consciousness: awake, alert  and oriented  Airway & Oxygen Therapy: Patient Spontanous Breathing and Patient connected to face mask oxygen  Post-op Assessment: Report given to RN and Post -op Vital signs reviewed and stable  Post vital signs: Reviewed and stable  Last Vitals:  Vitals Value Taken Time  BP 151/74 09/05/2018 12:01 PM  Temp 36.5 C 09/05/2018 12:01 PM  Pulse 94 09/05/2018 12:01 PM  Resp 17 09/05/2018 12:01 PM  SpO2 96 % 09/05/2018 12:01 PM  Vitals shown include unvalidated device data.  Last Pain:  Vitals:   09/05/18 1201  TempSrc: Oral  PainSc: 0-No pain         Complications: No apparent anesthesia complications

## 2018-09-05 NOTE — Progress Notes (Signed)
Dr. Bruna Potter notified of patient's HGB is up to 6. Waiting on new orders

## 2018-09-05 NOTE — Op Note (Signed)
[facility] Patient Name: Jared [name] Procedure Date : 09/05/2018 MRN: [id number] Attending MD: Willaim Rayas. Adela Lank , MD Date of Birth: 1955/11/25 CSN: [id number] Age: 63 Admit Type: Inpatient Procedure:                Upper GI endoscopy Indications:              Iron deficiency anemia, Gastrointestinal bleeding                            of unknown origin, dark stools, history of Goody                            powder use, ? history of cirrhosis / fibrosis of                            liver, family history of colon cancer, on Xarelto,                            no recent colonoscopy or endoscopy Providers:                Viviann Spare P. Adela Lank, MD, Jacquiline Doe, RN, Harrington Challenger, Technician Referring MD:              Medicines:                Monitored Anesthesia Care Complications:            No immediate complications. Estimated blood loss:                            None. Estimated Blood Loss:     Estimated blood loss: none. Procedure:                Pre-Anesthesia Assessment:                           - Prior to the procedure, a History and Physical                            was performed, and patient medications and                            allergies were reviewed. The patient's tolerance of                            previous anesthesia was also reviewed. The risks                            and benefits of the procedure and the sedation                            options and risks were discussed with the patient.  All questions were answered, and informed consent                            was obtained. Prior Anticoagulants: The patient has                            taken Xarelto (rivaroxaban), last dose was 1 day                            prior to procedure. ASA Grade Assessment: III - A                            patient with severe systemic disease. After                            reviewing the risks  and benefits, the patient was                            deemed in satisfactory condition to undergo the                            procedure.                           After obtaining informed consent, the endoscope was                            passed under direct vision. Throughout the                            procedure, the patient's blood pressure, pulse, and                            oxygen saturations were monitored continuously. The                            GIF-H190 ([id number]) Olympus gastroscope was                            introduced through the mouth, and advanced to the                            second part of duodenum. The upper GI endoscopy was                            accomplished without difficulty. The patient                            tolerated the procedure well. Scope In: Scope Out: Findings:      Esophagogastric landmarks were identified: the Z-line was found at 42       cm, the gastroesophageal junction was found at 42 cm and the upper       extent of the gastric folds was found at 44 cm from the incisors.  A 2 cm hiatal hernia was present.      The Z-line was irregular with some extension of salmon colored mucosa in       a few areas 5-66mm above the GEJ or so. Biopsies not taken due to       inflammation.      Mild esophagitis was found 42 cm from the incisors at the GEJ - some       erythematous markings were noted, from reflux or due to contact       irritation from the scope, no bleeding noted.      The exam of the esophagus was otherwise normal. No varices.      The entire examined stomach was normal. No ulcers, no varices. No heme.      The duodenal bulb and second portion of the duodenum were normal. No       heme. Impression:               - Esophagogastric landmarks identified.                           - 2 cm hiatal hernia.                           - Z-line irregular as outlined.                           - Mild esophagitis.                            - Normal stomach.                           - Normal duodenal bulb and second portion of the                            duodenum.                           No obvious cause for severe anemia on this exam.                            Recommend colonoscopy to further evaluate. Recommendation:           - Return patient to hospital ward for ongoing care.                           - Clear liquid diet in preparation for colonoscopy                            tomorrow                           - continue to hold Xarelto                           - continue present medications.                           - bowel prep tonight, colonoscopy tomorrow  with Dr.                            Orvan Falconer                           - start protonix 40mg  once daily for esophagitis,                            consider repeat EGD as outpatient to evaluate for                            Barrett's Procedure Code(s):        --- Professional ---                           [phone], Esophagogastroduodenoscopy, flexible,                            transoral; diagnostic, including collection of                            specimen(s) by brushing or washing, when performed                            (separate procedure) Diagnosis Code(s):        --- Professional ---                           K44.9, Diaphragmatic hernia without obstruction or                            gangrene                           K22.8, Other specified diseases of esophagus                           K20.9, Esophagitis, unspecified                           D50.0, Iron deficiency anemia secondary to blood                            loss (chronic)                           K92.2, Gastrointestinal hemorrhage, unspecified CPT copyright 2019 American Medical Association. All rights reserved. The codes documented in this report are preliminary and upon coder review may  be revised to meet current compliance requirements. Viviann Spare P. Armbruster,  MD 09/05/2018 12:09:38 PM This report has been signed electronically. Number of Addenda: 0

## 2018-09-05 NOTE — Progress Notes (Addendum)
                                                                           [facility] Consult: 8:23 AM 09/05/2018  LOS: 0 days    Referring Provider: Dr Alekh  Primary Care Physician:  Newlin, Enobong, MD Primary Gastroenterologist:  Dr. Danis     Reason for Consultation: Iron deficiency anemia.   HPI: Jared [name] is a 62 y.o. male.  Hx COPD.  Still smoking.  Chronic xarelto.   Atrial flutter.  CAD.  Combined CHF.  Latest 2D echo 04/2018 with LVEF 45 to 50%, grade 1 diastolic dysfunction CVA.  Hypertension. Elevated LFTs and ultrasound showing liver nodularity, suggesting cirrhosis in 06/2016.  2 previous ultrasounds 2017 did not show liver abnormality.  Ultrasound with hepatic elastography 08/2016 showed F2/F3 fibrosis without cirrhosis or liver lesions.  No hepatitis B or C.  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  Patient has not been seen by GI since 08/2016.  No prior EGD or colonoscopy in Epic.  He reports a couple of colonoscopies, the most recent about 20 years ago, in [place].  Not aware of any particular findings.  Does not recall prior EGD.  3 weeks of darker, brown/black stools.  These are formed and occur about 2 times a day which is his normal frequency.  Starting 2 weeks ago progressive DOE and fatigue.  Stable cough.  This became acutely worse yesterday and he was dizzy.  He was unable to get up from the commode to walk back into the house, felt like his knees were buckling beneath him.  No syncope. Patient has no chronic reflux symptoms.  His appetite is good.  No nausea, no dysphagia.  Takes 2 BC powders daily, no other aspirin or NSAID products.  In general he tends to have mid to lower abdominal pain which is triggered by cough but not by food.  No relief after bowel movement.  Presented to the  ED.  Blood pressure one 5/59, pulse in the 80s.  Respirations 23. Currently blood pressures are reading 130s over 70s with pulses in the 70s.  No fevers.    Hgb 4.9 >> 2 PRBCs>> 6, unit # 3 transfusing.  MCV 85.  Platelets 174 K.   Iron 9, ferritin 3. PT/INR 18.4/1.6. Troponins not elevated.  No renal dysfunction.  LFTs normal.  BNP not elevated. Portable CXR showing COPD, nothing acute.  IV Protonix initiated.  Last Xarelto yest AM.    Reports smoking 7 cigarettes a day.  Denies EtOH (stopped EtOH altogether in fall 2019) or recreational drug use. Family history positive for colon cancer in his mother with liver mets.   Past Medical History:  Diagnosis Date  . CAD (coronary artery disease)    a. LHC 5/12:  LAD 20, pLCx 20, pRCA 40, dRCA 40, EF 35%, diff HK  //  b. Myoview 4/16: Overall Impression: High risk stress nuclear study There is no evidence of ischemia. There is severe LV dysfunction. LV Ejection Fraction: 30%. LV Wall Motion: There is global LV hypokinesis.   . CAP (community acquired pneumonia) 09/2013  . Chronic combined systolic and   diastolic CHF (congestive heart failure) (HCC)    a. Echo 4/16:Mild LVH, EF 40-45%, diffuse HK //  b. Echo 8/17: EF 35-40%, diffuse HK, diastolic dysfunction, aortic sclerosis, trivial MR, moderate LAE, normal RVSF, moderate RAE, mild TR, PASP 42 mmHg // c. Echo 4/18: Mild concentric LVH, EF 30-35, normal wall motion, grade 1 diastolic dysfunction, PASP 49  . Cluster headache    "hx; haven't had one in awhile" (01/09/2016)  . COPD (chronic obstructive pulmonary disease) (HCC)    /notes 01/09/2016  . History of CVA (cerebrovascular accident)   . Hypertension   . NICM (nonischemic cardiomyopathy) (HCC)   . Tobacco abuse     Past Surgical History:  Procedure Laterality Date  . CARDIAC CATHETERIZATION  10/2010   LM normal, LAD with 20% irregularities, LCX with 20%, RCA with 40% prox and 40% distal - EF of 35%  . COLONOSCOPY W/ BIOPSIES AND  POLYPECTOMY    . EXCISION MASS HEAD    . INCISION AND DRAINAGE PERIRECTAL ABSCESS N/A 06/05/2017   Procedure: IRRIGATION AND DEBRIDEMENT PERIRECTAL ABSCESS;  Surgeon: White, Christopher M, MD;  Location: [facility] OR;  Service: General;  Laterality: N/A;  . VIDEO BRONCHOSCOPY Bilateral 05/08/2016   Procedure: VIDEO BRONCHOSCOPY WITH FLUORO;  Surgeon: Rakesh V Alva, MD;  Location: [facility] ENDOSCOPY;  Service: Cardiopulmonary;  Laterality: Bilateral;    Prior to Admission medications   Medication Sig Start Date End Date Taking? Authorizing Provider  albuterol (PROVENTIL HFA;VENTOLIN HFA) 108 (90 Base) MCG/ACT inhaler Inhale 2 puffs into the lungs every 6 (six) hours as needed for wheezing or shortness of breath. 05/15/18   Newlin, Enobong, MD  amoxicillin-clavulanate (AUGMENTIN) 875-125 MG tablet Take 1 tablet by mouth 2 (two) times daily. One po bid x 7 days 07/30/18   Wickline, Donald, MD  atorvastatin (LIPITOR) 80 MG tablet Take 1 tablet (80 mg total) by mouth daily. 05/15/18   Newlin, Enobong, MD  carvedilol (COREG) 3.125 MG tablet Take 1 tablet (3.125 mg total) by mouth 2 (two) times daily. 05/15/18 05/15/19  Newlin, Enobong, MD  cetirizine (ZYRTEC) 10 MG tablet Take 1 tablet (10 mg total) by mouth daily. 06/18/18   Newlin, Enobong, MD  docusate sodium (COLACE) 100 MG capsule Take 1 capsule (100 mg total) by mouth every 12 (twelve) hours. Patient not taking: Reported on 03/30/2018 06/02/17   Browning, Robert, PA-C  fluticasone furoate-vilanterol (BREO ELLIPTA) 100-25 MCG/INH AEPB Inhale 1 puff into the lungs daily. 05/15/18   Newlin, Enobong, MD  furosemide (LASIX) 40 MG tablet Take 1 tablet (40 mg total) by mouth daily. 05/16/18 05/16/19  Newlin, Enobong, MD  gabapentin (NEURONTIN) 300 MG capsule Take 1 capsule (300 mg total) by mouth at bedtime. 06/18/18   Newlin, Enobong, MD  losartan (COZAAR) 50 MG tablet Take 1 tablet (50 mg total) by mouth daily. 05/15/18   Newlin, Enobong, MD  OXYGEN Place 2 L into the nose  daily. 2lpm when needed per pt    [provider]  rivaroxaban (XARELTO) 20 MG TABS tablet Take 1 tablet (20 mg total) by mouth daily with supper. 05/15/18   Newlin, Enobong, MD    Scheduled Meds: . sodium chloride   Intravenous Once  . atorvastatin  80 mg Oral Daily  . carvedilol  3.125 mg Oral BID WC  . fluticasone furoate-vilanterol  1 puff Inhalation Daily  . gabapentin  300 mg Oral Daily  . ipratropium-albuterol  3 mL Nebulization Q6H  . pantoprazole (PROTONIX) IV  40 mg Intravenous Q12H     Infusions: . dextrose 5 % and 0.45% NaCl 75 mL/hr at 09/05/18 0310   PRN Meds: acetaminophen **OR** acetaminophen, albuterol, oxyCODONE, promethazine, traZODone   Allergies as of 09/04/2018 - Review Complete 09/04/2018  Allergen Reaction Noted  . Lisinopril Cough 06/29/2014    Family History  Problem Relation Age of Onset  . Heart disease Mother   . Diabetes Mother   . Colon cancer Mother   . Liver cancer Mother   . Cancer Father        type unknown  . Diabetes Sister        x 2  . Diabetes Brother     Social History   Socioeconomic History  . Marital status: Single    Spouse name: Not on file  . Number of children: 1  . Years of education: Not on file  . Highest education level: Not on file  Occupational History  . Occupation: retired  Social Needs  . Financial resource strain: Not on file  . Food insecurity:    Worry: Not on file    Inability: Not on file  . Transportation needs:    Medical: Not on file    Non-medical: Not on file  Tobacco Use  . Smoking status: Current Every Day Smoker    Packs/day: 0.25    Years: 42.00    Pack years: 10.50    Types: Cigarettes  . Smokeless tobacco: Never Used  . Tobacco comment: 3 cigs daily  Substance and Sexual Activity  . Alcohol use: Yes    Alcohol/week: 0.0 standard drinks    Comment: last drink was before xmas  . Drug use: No    Types: Cocaine, Marijuana    Comment: "nothing in 20 years"  . Sexual  activity: Not Currently  Lifestyle  . Physical activity:    Days per week: Not on file    Minutes per session: Not on file  . Stress: Not on file  Relationships  . Social connections:    Talks on phone: Not on file    Gets together: Not on file    Attends religious service: Not on file    Active member of club or organization: Not on file    Attends meetings of clubs or organizations: Not on file    Relationship status: Not on file  . Intimate partner violence:    Fear of current or ex partner: Not on file    Emotionally abused: Not on file    Physically abused: Not on file    Forced sexual activity: Not on file  Other Topics Concern  . Not on file  Social History Narrative   unemployed    REVIEW OF SYSTEMS: Constitutional: Weakness, fatigue. ENT:  No nose bleeds Pulm: DOE.  No resting dyspnea. CV:  No palpitations, no LE edema.  Some dental chest discomfort GU:  No hematuria, no frequency GI: See HPI. Heme: No unusual or excessive bleeding or bruising. Transfusions: Does not recall prior transfusions or issues with anemia. Neuro:  No headaches, no peripheral tingling or numbness.   Some lightheadedness/dizziness. Derm:  No itching, no rash or sores.  Endocrine:  No sweats or chills.  No polyuria or dysuria Immunization: Current on flu and pneumococcal vaccinations. Travel:  None beyond local counties in last few months.    PHYSICAL EXAM: Vital signs in last 24 hours: Vitals:   09/05/18 0636 09/05/18 0700  BP: (!) 141/76 133/76  Pulse: 73 74  Resp: 18 18  Temp: 98.2 F (36.8   C) 98.2 F (36.8 C)  SpO2: 93%    Wt Readings from Last 3 Encounters:  09/04/18 91.8 kg  07/29/18 96.2 kg  06/18/18 96.4 kg    General: Pleasant, robust looking WM.  Comfortable, no distress. Head: Facial asymmetry or swelling.  No signs of head trauma. Eyes: Scleral icterus, no conjunctival pallor.  EOMI Ears: Not HOH Nose: No discharge or congestion.  No sneezing. Mouth: Tongue  midline.  Oral mucosa moist, clear.  Fair dentition. Neck: No JVD, no masses, no thyromegaly. Lungs: Greatly diminished breath sounds.   Heart: RRR.  No MRG.  S1, S2 present. Abdomen: Soft.  Not tender or distended.  Active bowel sounds.  No HSM, masses, bruits, hernias..   Rectal: Deferred rectal exam.  Yesterday's DRE in the ED showed dark, black stool, FOBT positive. Musc/Skeltl: No joint redness, swelling or gross deformity. Extremities: No CCE.  Feet are warm 2+ pedal pulses. Neurologic: Oriented x3.  Moves all 4 limbs.  No tremors, no weakness. Skin: Rashes, no sores. Nodes: No cervical adenopathy. Psych: Cooperative, calm, pleasant.  Intake/Output from previous day: 03/26 0701 - 03/27 0700 In: 656 [I.V.:120; Blood:536] Out: 250 [Urine:250] Intake/Output this shift: No intake/output data recorded.  LAB RESULTS: Recent Labs    09/04/18 1629 09/04/18 1646 09/05/18 0253  WBC 9.3  --  7.9  HGB 4.9* 5.8* 6.0*  HCT 17.8* 17.0* 20.5*  PLT 196  --  174   BMET Lab Results  Component Value Date   NA 141 09/05/2018   NA 139 09/04/2018   NA 139 09/04/2018   K 4.3 09/05/2018   K 4.5 09/04/2018   K 4.6 09/04/2018   CL 96 (L) 09/05/2018   CL 96 (L) 09/04/2018   CL 98 05/30/2018   CO2 36 (H) 09/05/2018   CO2 35 (H) 09/04/2018   CO2 35 (H) 05/30/2018   GLUCOSE 171 (H) 09/05/2018   GLUCOSE 101 (H) 09/04/2018   GLUCOSE 111 (H) 05/30/2018   BUN 13 09/05/2018   BUN 16 09/04/2018   BUN 13 05/30/2018   CREATININE 0.82 09/05/2018   CREATININE 0.95 09/04/2018   CREATININE 0.95 05/30/2018   CALCIUM 8.8 (L) 09/05/2018   CALCIUM 8.5 (L) 09/04/2018   CALCIUM 8.7 (L) 05/30/2018   LFT Recent Labs    09/04/18 1629  PROT 5.7*  ALBUMIN 3.4*  AST 34  ALT 16  ALKPHOS 46  BILITOT 0.7   PT/INR Lab Results  Component Value Date   INR 1.6 (H) 09/05/2018   INR 2.6 09/04/2017   INR 1.3 08/26/2017   Hepatitis Panel No results for input(s): HEPBSAG, HCVAB, HEPAIGM, HEPBIGM in  the last 72 hours. C-Diff No components found for: CDIFF Lipase     Component Value Date/Time   LIPASE 47 07/01/2016 2019    Drugs of Abuse  No results found for: LABOPIA, COCAINSCRNUR, LABBENZ, AMPHETMU, THCU, LABBARB   RADIOLOGY STUDIES: Dg Chest Portable 1 View  Result Date: 09/04/2018 CLINICAL DATA:  Central chest pain. Shortness of breath and cough for 1 week. EXAM: PORTABLE CHEST 1 VIEW COMPARISON:  None. FINDINGS: Cardiac silhouette is normal in size. No mediastinal or hilar masses. No evidence of adenopathy. Lungs are hyperexpanded with mildly prominent bronchovascular and interstitial markings, stable from the prior exam. Lungs are otherwise clear. No pleural effusion or pneumothorax. Skeletal structures are grossly intact. IMPRESSION: 1. No acute cardiopulmonary disease. 2. Lung hyperexpansion consistent with COPD. Electronically Signed   By: David  Ormond M.D.   On: 09/04/2018 16:56       IMPRESSION:   *   Iron deficiency anemia.  Symptomatic. FOBT +.  R/o ulcers.   Improved after 2 U PRBC, unit #3 completed.  Repeat Hgb to be drawn.   On IV Protonix gtt.    *    Chronic Xarelto.  History CVA, CAD, atrial flutter.  *    Hx hepatic congestion from heart failure.  EF in 2019 improved to 45%, up from 35%.  No signs of CHF by chest x-ray or BNP  *   Family history colon cancer in his mother.  He is not sure at what age this struck her.  Long overdue for screening colonoscopy.    PLAN:     *  EGD at 11 AM today.  D/w pt he is agreeable.   Wants to know if he can eat, take PO after EGD.    *   Next Hgb within 2 hours.     Deetta Siegmann  09/05/2018, 8:23 AM Phone [phone]     

## 2018-09-05 NOTE — Interval H&P Note (Signed)
History and Physical Interval Note:  09/05/2018 10:21 AM  Jared [name]  has presented today for surgery, with the diagnosis of FOBT positive, iron deficiency anemia.  Dark stool..  The various methods of treatment have been discussed with the patient and family. After consideration of risks, benefits and other options for treatment, the patient has consented to  Procedure(s): ESOPHAGOGASTRODUODENOSCOPY (EGD) WITH PROPOFOL (N/A) as a surgical intervention.  The patient's history has been reviewed, patient examined, no change in status, stable for surgery.  I have reviewed the patient's chart and labs.  Questions were answered to the patient's satisfaction.     Viviann Spare P Alexys Lobello

## 2018-09-05 NOTE — H&P (View-Only) (Signed)
[facility] Consult: 8:23 AM 09/05/2018  LOS: 0 days    Referring Provider: Dr Hanley Ben  Primary Care Physician:  [name], MD Primary Gastroenterologist:  Dr. Myrtie Neither     Reason for Consultation: Iron deficiency anemia.   HPI: Jared [name] is a 63 y.o. male.  Hx COPD.  Still smoking.  Chronic xarelto.   Atrial flutter.  CAD.  Combined CHF.  Latest 2D echo 04/2018 with LVEF 45 to 50%, grade 1 diastolic dysfunction CVA.  Hypertension. Elevated LFTs and ultrasound showing liver nodularity, suggesting cirrhosis in 06/2016.  2 previous ultrasounds 2017 did not show liver abnormality.  Ultrasound with hepatic elastography 08/2016 showed F2/F3 fibrosis without cirrhosis or liver lesions.  No hepatitis B or C.  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  Patient has not been seen by GI since 08/2016.  No prior EGD or colonoscopy in Epic.  He reports a couple of colonoscopies, the most recent about 20 years ago, in [place].  Not aware of any particular findings.  Does not recall prior EGD.  3 weeks of darker, brown/black stools.  These are formed and occur about 2 times a day which is his normal frequency.  Starting 2 weeks ago progressive DOE and fatigue.  Stable cough.  This became acutely worse yesterday and he was dizzy.  He was unable to get up from the commode to walk back into the house, felt like his knees were buckling beneath him.  No syncope. Patient has no chronic reflux symptoms.  His appetite is good.  No nausea, no dysphagia.  Takes 2 BC powders daily, no other aspirin or NSAID products.  In general he tends to have mid to lower abdominal pain which is triggered by cough but not by food.  No relief after bowel movement.  Presented to the  ED.  Blood pressure one 5/59, pulse in the 80s.  Respirations 23. Currently blood pressures are reading 130s over 70s with pulses in the 70s.  No fevers.    Hgb 4.9 >> 2 PRBCs>> 6, unit # 3 transfusing.  MCV 85.  Platelets 174 K.   Iron 9, ferritin 3. PT/INR 18.4/1.6. Troponins not elevated.  No renal dysfunction.  LFTs normal.  BNP not elevated. Portable CXR showing COPD, nothing acute.  IV Protonix initiated.  Last Xarelto yest AM.    Reports smoking 7 cigarettes a day.  Denies EtOH (stopped EtOH altogether in fall 2019) or recreational drug use. Family history positive for colon cancer in his mother with liver mets.   Past Medical History:  Diagnosis Date  . CAD (coronary artery disease)    a. LHC 5/12:  LAD 20, pLCx 20, pRCA 40, dRCA 40, EF 35%, diff HK  //  b. Myoview 4/16: Overall Impression: High risk stress nuclear study There is no evidence of ischemia. There is severe LV dysfunction. LV Ejection Fraction: 30%. LV Wall Motion: There is global LV hypokinesis.   Marland Kitchen CAP (community acquired pneumonia) 09/2013  . Chronic combined systolic and  diastolic CHF (congestive heart failure) (HCC)    a. Echo 4/16:Mild LVH, EF 40-45%, diffuse HK //  b. Echo 8/17: EF 35-40%, diffuse HK, diastolic dysfunction, aortic sclerosis, trivial MR, moderate LAE, normal RVSF, moderate RAE, mild TR, PASP 42 mmHg // c. Echo 4/18: Mild concentric LVH, EF 30-35, normal wall motion, grade 1 diastolic dysfunction, PASP 49  . Cluster headache    "hx; haven't had one in awhile" (01/09/2016)  . COPD (chronic obstructive pulmonary disease) (HCC)    [name] 01/09/2016  . History of CVA (cerebrovascular accident)   . Hypertension   . NICM (nonischemic cardiomyopathy) (HCC)   . Tobacco abuse     Past Surgical History:  Procedure Laterality Date  . CARDIAC CATHETERIZATION  10/2010   LM normal, LAD with 20% irregularities, LCX with 20%, RCA with 40% prox and 40% distal - EF of 35%  . COLONOSCOPY W/ BIOPSIES AND  POLYPECTOMY    . EXCISION MASS HEAD    . INCISION AND DRAINAGE PERIRECTAL ABSCESS N/A 06/05/2017   Procedure: IRRIGATION AND DEBRIDEMENT PERIRECTAL ABSCESS;  Surgeon: Andria Meuse, MD;  Location: [facility] OR;  Service: General;  Laterality: N/A;  . VIDEO BRONCHOSCOPY Bilateral 05/08/2016   Procedure: VIDEO BRONCHOSCOPY WITH FLUORO;  Surgeon: Oretha Milch, MD;  Location: [facility] ENDOSCOPY;  Service: Cardiopulmonary;  Laterality: Bilateral;    Prior to Admission medications   Medication Sig Start Date End Date Taking? Authorizing Provider  albuterol (PROVENTIL HFA;VENTOLIN HFA) 108 (90 Base) MCG/ACT inhaler Inhale 2 puffs into the lungs every 6 (six) hours as needed for wheezing or shortness of breath. 05/15/18   [name], MD  amoxicillin-clavulanate (AUGMENTIN) 875-125 MG tablet Take 1 tablet by mouth 2 (two) times daily. One po bid x 7 days 07/30/18   Zadie Rhine, MD  atorvastatin (LIPITOR) 80 MG tablet Take 1 tablet (80 mg total) by mouth daily. 05/15/18   [name], MD  carvedilol (COREG) 3.125 MG tablet Take 1 tablet (3.125 mg total) by mouth 2 (two) times daily. 05/15/18 05/15/19  [name], MD  cetirizine (ZYRTEC) 10 MG tablet Take 1 tablet (10 mg total) by mouth daily. 06/18/18   [name], MD  docusate sodium (COLACE) 100 MG capsule Take 1 capsule (100 mg total) by mouth every 12 (twelve) hours. Patient not taking: Reported on 03/30/2018 06/02/17   Roxy Horseman, PA-C  fluticasone furoate-vilanterol (BREO ELLIPTA) 100-25 MCG/INH AEPB Inhale 1 puff into the lungs daily. 05/15/18   [name], MD  furosemide (LASIX) 40 MG tablet Take 1 tablet (40 mg total) by mouth daily. 05/16/18 05/16/19  [name], MD  gabapentin (NEURONTIN) 300 MG capsule Take 1 capsule (300 mg total) by mouth at bedtime. 06/18/18   [name], MD  losartan (COZAAR) 50 MG tablet Take 1 tablet (50 mg total) by mouth daily. 05/15/18   [name], MD  OXYGEN Place 2 L into the nose  daily. 2lpm when needed per pt    [provider]  rivaroxaban (XARELTO) 20 MG TABS tablet Take 1 tablet (20 mg total) by mouth daily with supper. 05/15/18   [name], MD    Scheduled Meds: . sodium chloride   Intravenous Once  . atorvastatin  80 mg Oral Daily  . carvedilol  3.125 mg Oral BID WC  . fluticasone furoate-vilanterol  1 puff Inhalation Daily  . gabapentin  300 mg Oral Daily  . ipratropium-albuterol  3 mL Nebulization Q6H  . pantoprazole (PROTONIX) IV  40 mg Intravenous Q12H  Infusions: . dextrose 5 % and 0.45% NaCl 75 mL/hr at 09/05/18 0310   PRN Meds: acetaminophen **OR** acetaminophen, albuterol, oxyCODONE, promethazine, traZODone   Allergies as of 09/04/2018 - Review Complete 09/04/2018  Allergen Reaction Noted  . Lisinopril Cough 06/29/2014    Family History  Problem Relation Age of Onset  . Heart disease Mother   . Diabetes Mother   . Colon cancer Mother   . Liver cancer Mother   . Cancer Father        type unknown  . Diabetes Sister        x 2  . Diabetes Brother     Social History   Socioeconomic History  . Marital status: Single    Spouse name: Not on file  . Number of children: 1  . Years of education: Not on file  . Highest education level: Not on file  Occupational History  . Occupation: retired  Engineer, production  . Financial resource strain: Not on file  . Food insecurity:    Worry: Not on file    Inability: Not on file  . Transportation needs:    Medical: Not on file    Non-medical: Not on file  Tobacco Use  . Smoking status: Current Every Day Smoker    Packs/day: 0.25    Years: 42.00    Pack years: 10.50    Types: Cigarettes  . Smokeless tobacco: Never Used  . Tobacco comment: 3 cigs daily  Substance and Sexual Activity  . Alcohol use: Yes    Alcohol/week: 0.0 standard drinks    Comment: last drink was before xmas  . Drug use: No    Types: Cocaine, Marijuana    Comment: "nothing in 20 years"  . Sexual  activity: Not Currently  Lifestyle  . Physical activity:    Days per week: Not on file    Minutes per session: Not on file  . Stress: Not on file  Relationships  . Social connections:    Talks on phone: Not on file    Gets together: Not on file    Attends religious service: Not on file    Active member of club or organization: Not on file    Attends meetings of clubs or organizations: Not on file    Relationship status: Not on file  . Intimate partner violence:    Fear of current or ex partner: Not on file    Emotionally abused: Not on file    Physically abused: Not on file    Forced sexual activity: Not on file  Other Topics Concern  . Not on file  Social History Narrative   unemployed    REVIEW OF SYSTEMS: Constitutional: Weakness, fatigue. ENT:  No nose bleeds Pulm: DOE.  No resting dyspnea. CV:  No palpitations, no LE edema.  Some dental chest discomfort GU:  No hematuria, no frequency GI: See HPI. Heme: No unusual or excessive bleeding or bruising. Transfusions: Does not recall prior transfusions or issues with anemia. Neuro:  No headaches, no peripheral tingling or numbness.   Some lightheadedness/dizziness. Derm:  No itching, no rash or sores.  Endocrine:  No sweats or chills.  No polyuria or dysuria Immunization: Current on flu and pneumococcal vaccinations. Travel:  None beyond local counties in last few months.    PHYSICAL EXAM: Vital signs in last 24 hours: Vitals:   09/05/18 0636 09/05/18 0700  BP: (!) 141/76 133/76  Pulse: 73 74  Resp: 18 18  Temp: 98.2 F (36.8  C) 98.2 F (36.8 C)  SpO2: 93%    Wt Readings from Last 3 Encounters:  09/04/18 91.8 kg  07/29/18 96.2 kg  06/18/18 96.4 kg    General: Pleasant, robust looking WM.  Comfortable, no distress. Head: Facial asymmetry or swelling.  No signs of head trauma. Eyes: Scleral icterus, no conjunctival pallor.  EOMI Ears: Not HOH Nose: No discharge or congestion.  No sneezing. Mouth: Tongue  midline.  Oral mucosa moist, clear.  Fair dentition. Neck: No JVD, no masses, no thyromegaly. Lungs: Greatly diminished breath sounds.   Heart: RRR.  No MRG.  S1, S2 present. Abdomen: Soft.  Not tender or distended.  Active bowel sounds.  No HSM, masses, bruits, hernias..   Rectal: Deferred rectal exam.  Yesterday's DRE in the ED showed dark, black stool, FOBT positive. Musc/Skeltl: No joint redness, swelling or gross deformity. Extremities: No CCE.  Feet are warm 2+ pedal pulses. Neurologic: Oriented x3.  Moves all 4 limbs.  No tremors, no weakness. Skin: Rashes, no sores. Nodes: No cervical adenopathy. Psych: Cooperative, calm, pleasant.  Intake/Output from previous day: 03/26 0701 - 03/27 0700 In: 656 [I.V.:120; Blood:536] Out: 250 [Urine:250] Intake/Output this shift: No intake/output data recorded.  LAB RESULTS: Recent Labs    09/04/18 1629 09/04/18 1646 09/05/18 0253  WBC 9.3  --  7.9  HGB 4.9* 5.8* 6.0*  HCT 17.8* 17.0* 20.5*  PLT 196  --  174   BMET Lab Results  Component Value Date   NA 141 09/05/2018   NA 139 09/04/2018   NA 139 09/04/2018   K 4.3 09/05/2018   K 4.5 09/04/2018   K 4.6 09/04/2018   CL 96 (L) 09/05/2018   CL 96 (L) 09/04/2018   CL 98 05/30/2018   CO2 36 (H) 09/05/2018   CO2 35 (H) 09/04/2018   CO2 35 (H) 05/30/2018   GLUCOSE 171 (H) 09/05/2018   GLUCOSE 101 (H) 09/04/2018   GLUCOSE 111 (H) 05/30/2018   BUN 13 09/05/2018   BUN 16 09/04/2018   BUN 13 05/30/2018   CREATININE 0.82 09/05/2018   CREATININE 0.95 09/04/2018   CREATININE 0.95 05/30/2018   CALCIUM 8.8 (L) 09/05/2018   CALCIUM 8.5 (L) 09/04/2018   CALCIUM 8.7 (L) 05/30/2018   LFT Recent Labs    09/04/18 1629  PROT 5.7*  ALBUMIN 3.4*  AST 34  ALT 16  ALKPHOS 46  BILITOT 0.7   PT/INR Lab Results  Component Value Date   INR 1.6 (H) 09/05/2018   INR 2.6 09/04/2017   INR 1.3 08/26/2017   Hepatitis Panel No results for input(s): HEPBSAG, HCVAB, HEPAIGM, HEPBIGM in  the last 72 hours. C-Diff No components found for: CDIFF Lipase     Component Value Date/Time   LIPASE 47 07/01/2016 2019    Drugs of Abuse  No results found for: LABOPIA, COCAINSCRNUR, LABBENZ, AMPHETMU, THCU, LABBARB   RADIOLOGY STUDIES: Dg Chest Portable 1 View  Result Date: 09/04/2018 CLINICAL DATA:  Central chest pain. Shortness of breath and cough for 1 week. EXAM: PORTABLE CHEST 1 VIEW COMPARISON:  None. FINDINGS: Cardiac silhouette is normal in size. No mediastinal or hilar masses. No evidence of adenopathy. Lungs are hyperexpanded with mildly prominent bronchovascular and interstitial markings, stable from the prior exam. Lungs are otherwise clear. No pleural effusion or pneumothorax. Skeletal structures are grossly intact. IMPRESSION: 1. No acute cardiopulmonary disease. 2. Lung hyperexpansion consistent with COPD. Electronically Signed   By: Amie Portland M.D.   On: 09/04/2018 16:56  IMPRESSION:   *   Iron deficiency anemia.  Symptomatic. FOBT +.  R/o ulcers.   Improved after 2 U PRBC, unit #3 completed.  Repeat Hgb to be drawn.   On IV Protonix gtt.    *    Chronic Xarelto.  History CVA, CAD, atrial flutter.  *    Hx hepatic congestion from heart failure.  EF in 2019 improved to 45%, up from 35%.  No signs of CHF by chest x-ray or BNP  *   Family history colon cancer in his mother.  He is not sure at what age this struck her.  Long overdue for screening colonoscopy.    PLAN:     *  EGD at 11 AM today.  D/w pt he is agreeable.   Wants to know if he can eat, take PO after EGD.    *   Next Hgb within 2 hours.     Jennye Moccasin  09/05/2018, 8:23 AM Phone [phone]

## 2018-09-06 ENCOUNTER — Inpatient Hospital Stay (HOSPITAL_COMMUNITY): Payer: Medicaid Other | Admitting: Anesthesiology

## 2018-09-06 ENCOUNTER — Encounter (HOSPITAL_COMMUNITY)
Admission: EM | Disposition: A | Discharge status: Home / Self Care | Payer: Self-pay | Source: Home / Self Care | Attending: Internal Medicine

## 2018-09-06 ENCOUNTER — Encounter (HOSPITAL_COMMUNITY): Payer: Self-pay | Admitting: *Deleted

## 2018-09-06 DIAGNOSIS — K921 Melena: Secondary | ICD-10-CM

## 2018-09-06 DIAGNOSIS — D62 Acute posthemorrhagic anemia: Secondary | ICD-10-CM

## 2018-09-06 DIAGNOSIS — I5032 Chronic diastolic (congestive) heart failure: Secondary | ICD-10-CM

## 2018-09-06 DIAGNOSIS — J449 Chronic obstructive pulmonary disease, unspecified: Secondary | ICD-10-CM

## 2018-09-06 HISTORY — PX: GIVENS CAPSULE STUDY: SHX5432

## 2018-09-06 HISTORY — PX: COLONOSCOPY WITH PROPOFOL: SHX5780

## 2018-09-06 LAB — TYPE AND SCREEN
ABO/RH(D): O POS
Antibody Screen: NEGATIVE
Unit division: 0
Unit division: 0
Unit division: 0
Unit division: 0

## 2018-09-06 LAB — BASIC METABOLIC PANEL
Anion gap: 7 (ref 5–15)
BUN: 10 mg/dL (ref 8–23)
CO2: 37 mmol/L — ABNORMAL HIGH (ref 22–32)
Calcium: 8.7 mg/dL — ABNORMAL LOW (ref 8.9–10.3)
Chloride: 97 mmol/L — ABNORMAL LOW (ref 98–111)
Creatinine, Ser: 0.96 mg/dL (ref 0.61–1.24)
GFR calc Af Amer: >60 mL/min (ref >60)
GFR calc non Af Amer: >60 mL/min (ref >60)
Glucose, Bld: 111 mg/dL — ABNORMAL HIGH (ref 70–99)
Potassium: 3.9 mmol/L (ref 3.5–5.1)
Sodium: 141 mmol/L (ref 135–145)

## 2018-09-06 LAB — BPAM RBC
Blood Product Expiration Date: 202004022359
Blood Product Expiration Date: 202004172359
Blood Product Expiration Date: 202004172359
Blood Product Expiration Date: 202004172359
ISSUE DATE / TIME: 202003261839
ISSUE DATE / TIME: 202003262250
ISSUE DATE / TIME: 202003270644
ISSUE DATE / TIME: 202003271123
Unit Type and Rh: 5100
Unit Type and Rh: 5100
Unit Type and Rh: 5100
Unit Type and Rh: 5100

## 2018-09-06 LAB — HEMOGLOBIN AND HEMATOCRIT, BLOOD
HCT: 25.2 % — ABNORMAL LOW (ref 39.0–52.0)
HCT: 26.3 % — ABNORMAL LOW (ref 39.0–52.0)
Hemoglobin: 7.5 g/dL — ABNORMAL LOW (ref 13.0–17.0)
Hemoglobin: 7.7 g/dL — ABNORMAL LOW (ref 13.0–17.0)

## 2018-09-06 LAB — MAGNESIUM: Magnesium: 2 mg/dL (ref 1.7–2.4)

## 2018-09-06 SURGERY — IMAGING PROCEDURE, GI TRACT, INTRALUMINAL, VIA CAPSULE
Anesthesia: Choice

## 2018-09-06 SURGERY — COLONOSCOPY WITH PROPOFOL
Anesthesia: Monitor Anesthesia Care

## 2018-09-06 MED ORDER — PROPOFOL 10 MG/ML IV BOLUS
INTRAVENOUS | Status: DC | PRN
  Administered 2018-09-06 (×2): 20 mg via INTRAVENOUS

## 2018-09-06 MED ORDER — ACETAMINOPHEN 650 MG RE SUPP: 650.0000 mg | Freq: Four times a day (QID) | RECTAL | 0 refills | Status: DC | PRN

## 2018-09-06 MED ORDER — PROPOFOL 500 MG/50ML IV EMUL
INTRAVENOUS | Status: DC | PRN
  Administered 2018-09-06: 100 ug/kg/min via INTRAVENOUS

## 2018-09-06 MED ORDER — ATORVASTATIN CALCIUM 80 MG PO TABS: 80.0000 mg | ORAL_TABLET | Freq: Every day | ORAL | 0 refills | Status: DC

## 2018-09-06 MED ORDER — LACTATED RINGERS IV SOLN
INTRAVENOUS | Status: DC
  Administered 2018-09-06 (×2): via INTRAVENOUS

## 2018-09-06 MED ORDER — ACETAMINOPHEN 325 MG PO TABS: 650.0000 mg | ORAL_TABLET | Freq: Four times a day (QID) | ORAL | 1 refills | Status: DC | PRN

## 2018-09-06 MED ORDER — PANTOPRAZOLE SODIUM 40 MG PO TBEC: 40.0000 mg | DELAYED_RELEASE_TABLET | Freq: Every day | ORAL | 0 refills | Status: DC

## 2018-09-06 MED ORDER — TRAZODONE HCL 50 MG PO TABS: 25.0000 mg | ORAL_TABLET | Freq: Every evening | ORAL | 0 refills | Status: DC | PRN

## 2018-09-06 MED ORDER — FUROSEMIDE 40 MG PO TABS
40.0000 mg | ORAL_TABLET | Freq: Every day | ORAL | Status: DC
  Administered 2018-09-06 – 2018-09-07 (×2): 40 mg via ORAL
  Filled 2018-09-06 (×2): qty 1

## 2018-09-06 SURGICAL SUPPLY — 21 items

## 2018-09-06 NOTE — Interval H&P Note (Signed)
History and Physical Interval Note:  09/06/2018 7:58 AM  Jared [name]  has presented today for surgery, with the diagnosis of trnasfuison  requiring anemia, FOBT +. no source on EGD of 3/27.  The various methods of treatment have been discussed with the patient and family. After consideration of risks, benefits and other options for treatment, the patient has consented to  Procedure(s): COLONOSCOPY WITH PROPOFOL (N/A) as a surgical intervention.  The patient's history has been reviewed, patient examined, no change in status, stable for surgery.  I have reviewed the patient's chart and labs.  Questions were answered to the patient's satisfaction.     Tressia Danas

## 2018-09-06 NOTE — Transfer of Care (Signed)
Immediate Anesthesia Transfer of Care Note  Patient: Odilon Cass  Procedure(s) Performed: COLONOSCOPY WITH PROPOFOL (N/A )  Patient Location: Endoscopy Unit  Anesthesia Type:MAC  Level of Consciousness: awake  Airway & Oxygen Therapy: Patient Spontanous Breathing and Patient connected to face mask oxygen  Post-op Assessment: Report given to RN and Post -op Vital signs reviewed and stable  Post vital signs: Reviewed and stable  Last Vitals:  Vitals Value Taken Time  BP    Temp    Pulse 76 09/06/2018  8:26 AM  Resp 18 09/06/2018  8:26 AM  SpO2 99 % 09/06/2018  8:26 AM  Vitals shown include unvalidated device data.  Last Pain:  Vitals:   09/06/18 0725  TempSrc: Oral  PainSc: 0-No pain         Complications: No apparent anesthesia complications

## 2018-09-06 NOTE — Progress Notes (Signed)
Pt ingested capsule at 0900.  Instructions given to pt and nurse.  Leads and recorder can be removed at 2100 tonight.  Roselie Awkward, RN

## 2018-09-06 NOTE — Op Note (Signed)
[facility] Patient Name: Jared [name] Procedure Date : 09/06/2018 MRN: [id number] Attending MD: Tressia Danas MD, MD Date of Birth: 1955-07-22 CSN: [id number] Age: 63 Admit Type: Inpatient Procedure:                Colonoscopy Indications:              Melena. Progressive anemia in the setting of dark                            stools. On Xarelto (last dose 09/04/18). Family                            history of colon cancer (mother). Last colonoscopy                            > 20 years ago. Providers:                Tressia Danas MD, MD, Roselie Awkward, RN, Lawson Radar, Technician, Dairl Ponder, CRNA Referring MD:              Medicines:                See the Anesthesia note for documentation of the                            administered medications Complications:            No immediate complications. Estimated Blood Loss:     Estimated blood loss: none. Procedure:                Pre-Anesthesia Assessment:                           - Prior to the procedure, a History and Physical                            was performed, and patient medications and                            allergies were reviewed. The patient's tolerance of                            previous anesthesia was also reviewed. The risks                            and benefits of the procedure and the sedation                            options and risks were discussed with the patient.                            All questions were answered, and informed consent  was obtained. Prior Anticoagulants: The patient has                            taken Xarelto (rivaroxaban), last dose was 3 days                            prior to procedure. ASA Grade Assessment: III - A                            patient with severe systemic disease. After                            reviewing the risks and benefits, the patient was                            deemed  in satisfactory condition to undergo the                            procedure.                           After obtaining informed consent, the colonoscope                            was passed under direct vision. Throughout the                            procedure, the patient's blood pressure, pulse, and                            oxygen saturations were monitored continuously. The                            CF-HQ190L ([id number]) Olympus colonoscope was                            introduced through the anus and advanced to the the                            terminal ileum, with identification of the                            appendiceal orifice and IC valve. The colonoscopy                            was performed without difficulty. The patient                            tolerated the procedure well. The quality of the                            bowel preparation was excellent. The terminal  ileum, ileocecal valve, appendiceal orifice, and                            rectum were photographed. Scope In: 8:10:20 AM Scope Out: 8:22:11 AM Scope Withdrawal Time: 0 hours 9 minutes 49 seconds  Total Procedure Duration: 0 hours 11 minutes 51 seconds  Findings:      The perianal and digital rectal examinations were normal. Non-bleeding       internal hemorrhoids seen.      The entire examined colon appeared normal on direct and retroflexion       views. No blood present in the colon. Approximately 10 cm of distal       terminal ileum was evaluated. No blood present. No mucosal abnormalities. Impression:               - The entire examined colon is normal on direct and                            retroflexion views.                           - Small internal hemorrhoids seen.                           - No specimens collected.                           - No source of GI blood loss anemia identified on                            this examination. Recommendation:            - Return patient to hospital ward for ongoing care.                           - Continue present medications.                           - Continue serial hemoglobin/hematocrit with                            transfusion as indicated.                           - To visualize the small bowel, video capsule                            endoscopy today. The patient was consented in the                            recovery room and swallowed the capsule prior to                            his return to the hospital ward.                           - Post- capsule endoscopy diet orders were entered  in EPIC. Procedure Code(s):        --- Professional ---                           [id number], Colonoscopy, flexible; diagnostic, including                            collection of specimen(s) by brushing or washing,                            when performed (separate procedure) Diagnosis Code(s):        --- Professional ---                           K92.1, Melena (includes Hematochezia) CPT copyright 2019 [employer]. All rights reserved. The codes documented in this report are preliminary and upon coder review may  be revised to meet current compliance requirements. Tressia Danas MD, MD 09/06/2018 9:32:44 AM This report has been signed electronically. Number of Addenda: 0

## 2018-09-06 NOTE — Progress Notes (Signed)
Patient ID: Jared [name], male   DOB: Mar 09, 1956, 63 y.o.   MRN: [id number]  PROGRESS NOTE    Jared [name]  BJY:[id number] DOB: 03/18/1956 DOA: 09/04/2018 PCP: [name], MD   Brief Narrative:  63 year old male with history of COPD, chronic hypoxic respiratory failure on 3 L oxygen via nasal cannula, CAD, combined CHF, CVA without residual deficit, hypertension, tobacco use disorder, atrial flutter on Xarelto presented with shortness of breath for 3 days along with dark stools for 2 weeks.  His hemoglobin was found to be 4.9 (was 14 in 05/2018) and FOBT was positive.  He was started on IV Protonix.  GI was consulted.  He was transfused with packed red cells.  Assessment & Plan:   Principal Problem:   Symptomatic anemia Active Problems:   Essential hypertension   CAD (coronary artery disease)   COPD with acute exacerbation (HCC)   Atrial flutter (HCC)   Tobacco use disorder   Iron deficiency anemia   Symptomatic anemia due to upper GI bleeding -Presented with hemoglobin of 4.9 with dark stools and FOBT positive.   -Status post 4 units packed red cells transfusion.  Hemoglobin 7.5 this morning -Status post EGD on 09/05/2018 which showed mild esophagitis.   -Continue oral Protonix.   -Monitor H&H.   - Xarelto on hold. -Status post colonoscopy on 09/06/2018: Colon was normal.  Plan for capsule endoscopy by GI. -DC IV fluid  Chronic COPD with chronic hypoxic respiratory failure -Doubt that patient has COPD exacerbation at this time.  Dyspnea is probably secondary to anemia -Continue Breo Ellipta and DuoNeb.  Currently not wheezing. -Currently on 2 L oxygen via nasal cannula.  Outpatient follow-up with pulmonary  Atypical chest pain -Likely due to anemia.  Troponin negative.  EKG was reassuring  Chronic combined CHF -Echo in 04/2018 showed EF of 45 to 50% with grade 1 diastolic dysfunction -Appears euvolemic -We will resume Lasix. -Strict input and output with daily weights.  -Continue Coreg.  Losartan on hold.  Paroxysmal atrial flutter -Currently rate controlled and in sinus rhythm.  Xarelto on hold.  Continue Coreg.  Outpatient follow-up with cardiology  DVT prophylaxis: SCDs Code Status: Full Family Communication: None at bedside Disposition Plan: Home once cleared by GI  Consultants: GI Procedures:  EGD on 09/05/2018 Impression:               - Esophagogastric landmarks identified.                           - 2 cm hiatal hernia.                           - Z-line irregular as outlined.                           - Mild esophagitis.                           - Normal stomach.                           - Normal duodenal bulb and second portion of the                            duodenum.  No obvious cause for severe anemia on this exam.                            Recommend colonoscopy to further evaluate. Recommendation:           - Return patient to hospital ward for ongoing care.                           - Clear liquid diet in preparation for colonoscopy                            tomorrow                           - continue to hold Xarelto                           - continue present medications.                           - bowel prep tonight, colonoscopy tomorrow with Dr.                            Orvan Falconer                           - start protonix 40mg  once daily for esophagitis,                            consider repeat EGD as outpatient to evaluate for                            Barrett's  Colonoscopy on 09/06/2018 Findings:      The perianal and digital rectal examinations were normal. Non-bleeding       internal hemorrhoids seen.      The entire examined colon appeared normal on direct and retroflexion       views. No blood present in the colon. Approximately 10 cm of distal       terminal ileum was evaluated. No blood present. No mucosal abnormalities. Impression:               - The entire examined colon is  normal on direct and                            retroflexion views.                           - Small internal hemorrhoids seen.                           - No specimens collected.                           - No source of GI blood loss anemia identified on                            this  examination. Recommendation:           - Return patient to hospital ward for ongoing care.                           - Continue present medications.                           - Continue serial hemoglobin/hematocrit with                            transfusion as indicated.                           - To visualize the small bowel, video capsule                            endoscopy today. The patient was consented in the                            recovery room and swallowed the capsule prior to                            his return to the hospital ward.                           - Post- capsule endoscopy diet orders were entered                            in EPIC.  Antimicrobials: None   Subjective: Patient seen and examined at bedside.  She denies any current abdominal pain, nausea, vomiting.  No overnight fever.  Objective: Vitals:   09/06/18 0435 09/06/18 0725 09/06/18 0827 09/06/18 0835  BP: 119/68 (!) 153/76 111/65 135/65  Pulse: 68 69 76 67  Resp: 18 16 18 16   Temp: 98 F (36.7 C) 98.7 F (37.1 C) 98.1 F (36.7 C)   TempSrc: Oral Oral Oral   SpO2: 97% 98% 99% 97%  Weight:      Height:        Intake/Output Summary (Last 24 hours) at 09/06/2018 0950 Last data filed at 09/06/2018 0823 Gross per 24 hour  Intake 2274.93 ml  Output 925 ml  Net 1349.93 ml   Filed Weights   09/04/18 1615 09/04/18 2026  Weight: 94.3 kg 91.8 kg    Examination:  General exam: Appears calm and comfortable.  No distress Respiratory system: Bilateral decreased breath sounds at bases with some scattered crackles. Cardiovascular system: S1 & S2 heard, Rate controlled Gastrointestinal system: Abdomen is  nondistended, soft and nontender. Normal bowel sounds heard. Extremities: No cyanosis; trace edema   Data Reviewed: I have personally reviewed following labs and imaging studies  CBC: Recent Labs  Lab 09/04/18 1629 09/04/18 1646 09/05/18 0253 09/05/18 0956 09/05/18 1626 09/06/18 0550  WBC 9.3  --  7.9  --   --   --   NEUTROABS 4.2  --   --   --   --   --   HGB 4.9* 5.8* 6.0* 6.8* 8.3* 7.5*  HCT 17.8* 17.0* 20.5* 22.7* 27.4* 25.2*  MCV 88.6  --  85.4  --   --   --  PLT 196  --  174  --   --   --    Basic Metabolic Panel: Recent Labs  Lab 09/04/18 1629 09/04/18 1646 09/05/18 0253 09/06/18 0550  NA 139 139 141 141  K 4.6 4.5 4.3 3.9  CL 96*  --  96* 97*  CO2 35*  --  36* 37*  GLUCOSE 101*  --  171* 111*  BUN 16  --  13 10  CREATININE 0.95  --  0.82 0.96  CALCIUM 8.5*  --  8.8* 8.7*  MG  --   --   --  2.0   GFR: Estimated Creatinine Clearance: 92.9 mL/min (by C-G formula based on SCr of 0.96 mg/dL). Liver Function Tests: Recent Labs  Lab 09/04/18 1629  AST 34  ALT 16  ALKPHOS 46  BILITOT 0.7  PROT 5.7*  ALBUMIN 3.4*   No results for input(s): LIPASE, AMYLASE in the last 168 hours. No results for input(s): AMMONIA in the last 168 hours. Coagulation Profile: Recent Labs  Lab 09/05/18 0253  INR 1.6*   Cardiac Enzymes: Recent Labs  Lab 09/04/18 1629  TROPONINI <0.03   BNP (last 3 results) No results for input(s): PROBNP in the last 8760 hours. HbA1C: No results for input(s): HGBA1C in the last 72 hours. CBG: No results for input(s): GLUCAP in the last 168 hours. Lipid Profile: No results for input(s): CHOL, HDL, LDLCALC, TRIG, CHOLHDL, LDLDIRECT in the last 72 hours. Thyroid Function Tests: No results for input(s): TSH, T4TOTAL, FREET4, T3FREE, THYROIDAB in the last 72 hours. Anemia Panel: Recent Labs    09/05/18 0253  VITAMINB12 576  FOLATE 20.5  FERRITIN 3*  TIBC 358  IRON 9*  RETICCTPCT 2.5   Sepsis Labs: No results for input(s):  PROCALCITON, LATICACIDVEN in the last 168 hours.  No results found for this or any previous visit (from the past 240 hour(s)).       Radiology Studies: Dg Chest Portable 1 View  Result Date: 09/04/2018 CLINICAL DATA:  Central chest pain. Shortness of breath and cough for 1 week. EXAM: PORTABLE CHEST 1 VIEW COMPARISON:  None. FINDINGS: Cardiac silhouette is normal in size. No mediastinal or hilar masses. No evidence of adenopathy. Lungs are hyperexpanded with mildly prominent bronchovascular and interstitial markings, stable from the prior exam. Lungs are otherwise clear. No pleural effusion or pneumothorax. Skeletal structures are grossly intact. IMPRESSION: 1. No acute cardiopulmonary disease. 2. Lung hyperexpansion consistent with COPD. Electronically Signed   By: Amie Portland M.D.   On: 09/04/2018 16:56        Scheduled Meds: . sodium chloride   Intravenous Once  . atorvastatin  80 mg Oral Daily  . carvedilol  3.125 mg Oral BID WC  . fluticasone furoate-vilanterol  1 puff Inhalation Daily  . gabapentin  300 mg Oral Daily  . pantoprazole  40 mg Oral Q0600   Continuous Infusions: . dextrose 5 % and 0.45% NaCl 50 mL/hr at 09/06/18 0619     LOS: 1 day        Glade Lloyd, MD [facility] 09/06/2018, 9:50 AM

## 2018-09-06 NOTE — Anesthesia Preprocedure Evaluation (Addendum)
Anesthesia Evaluation  Patient identified by MRN, date of birth, ID band Patient awake    Reviewed: Allergy & Precautions, NPO status , Patient's Chart, lab work & pertinent test results  Airway Mallampati: II  TM Distance: >3 FB Neck ROM: Full    Dental  (+) Poor Dentition, Loose,    Pulmonary COPD,  oxygen dependent, Current Smoker,    Pulmonary exam normal breath sounds clear to auscultation       Cardiovascular hypertension, Pt. on home beta blockers and Pt. on medications + CAD and +CHF  Normal cardiovascular exam Rhythm:Regular Rate:Normal     Neuro/Psych  Headaches, CVA, No Residual Symptoms    GI/Hepatic negative GI ROS, Neg liver ROS,   Endo/Other  negative endocrine ROS  Renal/GU negative Renal ROS     Musculoskeletal negative musculoskeletal ROS (+)   Abdominal   Peds  Hematology  (+) anemia , HLD Coagulopathy   Anesthesia Other Findings Anemia requiring  transfuison  FOBT + No source on EGD of 3/27  Reproductive/Obstetrics                            Anesthesia Physical Anesthesia Plan  ASA: IV  Anesthesia Plan: MAC   Post-op Pain Management:    Induction: Intravenous  PONV Risk Score and Plan: 1 and Propofol infusion and Treatment may vary due to age or medical condition  Airway Management Planned: Simple Face Mask  Additional Equipment:   Intra-op Plan:   Post-operative Plan:   Informed Consent: I have reviewed the patients History and Physical, chart, labs and discussed the procedure including the risks, benefits and alternatives for the proposed anesthesia with the patient or authorized representative who has indicated his/her understanding and acceptance.     Dental advisory given  Plan Discussed with: CRNA  Anesthesia Plan Comments:       Anesthesia Quick Evaluation

## 2018-09-06 NOTE — Progress Notes (Signed)
PT Cancellation Note  Patient Details Name: Jared [name] MRN: [id number] DOB: October 05, 1955   Cancelled Treatment:    Reason Eval/Treat Not Completed: Patient at procedure or test/unavailable. PT will continue to f/u with pt acutely as available and appropriate.    Alessandra Bevels Herminia Warren 09/06/2018, 7:38 AM

## 2018-09-06 NOTE — Anesthesia Postprocedure Evaluation (Signed)
Anesthesia Post Note  Patient: Jedaiah Rathbun  Procedure(s) Performed: COLONOSCOPY WITH PROPOFOL (N/A )     Patient location during evaluation: PACU Anesthesia Type: MAC Level of consciousness: awake and alert Pain management: pain level controlled Vital Signs Assessment: post-procedure vital signs reviewed and stable Respiratory status: spontaneous breathing, nonlabored ventilation, respiratory function stable and patient connected to nasal cannula oxygen Cardiovascular status: stable and blood pressure returned to baseline Postop Assessment: no apparent nausea or vomiting Anesthetic complications: no    Last Vitals:  Vitals:   09/06/18 0827 09/06/18 0835  BP: 111/65 135/65  Pulse: 76 67  Resp: 18 16  Temp: 36.7 C   SpO2: 99% 97%    Last Pain:  Vitals:   09/06/18 0900  TempSrc:   PainSc: 0-No pain                 Abia Monaco P Longino Trefz

## 2018-09-07 ENCOUNTER — Encounter (HOSPITAL_COMMUNITY): Payer: Self-pay | Admitting: Gastroenterology

## 2018-09-07 DIAGNOSIS — K209 Esophagitis, unspecified: Secondary | ICD-10-CM

## 2018-09-07 LAB — CBC WITH DIFFERENTIAL/PLATELET
Abs Immature Granulocytes: 0.04 10*3/uL (ref 0.00–0.07)
Basophils Absolute: 0 10*3/uL (ref 0.0–0.1)
Basophils Relative: 0 %
Eosinophils Absolute: 0.1 10*3/uL (ref 0.0–0.5)
Eosinophils Relative: 1 %
HCT: 27.3 % — ABNORMAL LOW (ref 39.0–52.0)
Hemoglobin: 8 g/dL — ABNORMAL LOW (ref 13.0–17.0)
Immature Granulocytes: 0 %
Lymphocytes Relative: 35 %
Lymphs Abs: 3.5 10*3/uL (ref 0.7–4.0)
MCH: 25.5 pg — ABNORMAL LOW (ref 26.0–34.0)
MCHC: 29.3 g/dL — ABNORMAL LOW (ref 30.0–36.0)
MCV: 86.9 fL (ref 80.0–100.0)
Monocytes Absolute: 0.8 10*3/uL (ref 0.1–1.0)
Monocytes Relative: 8 %
Neutro Abs: 5.7 10*3/uL (ref 1.7–7.7)
Neutrophils Relative %: 56 %
Platelets: 188 10*3/uL (ref 150–400)
RBC: 3.14 MIL/uL — ABNORMAL LOW (ref 4.22–5.81)
RDW: 15.7 % — ABNORMAL HIGH (ref 11.5–15.5)
WBC: 10.1 10*3/uL (ref 4.0–10.5)
nRBC: 0.2 % (ref 0.0–0.2)

## 2018-09-07 LAB — BASIC METABOLIC PANEL
Anion gap: 6 (ref 5–15)
BUN: 9 mg/dL (ref 8–23)
CO2: 37 mmol/L — ABNORMAL HIGH (ref 22–32)
Calcium: 8.3 mg/dL — ABNORMAL LOW (ref 8.9–10.3)
Chloride: 99 mmol/L (ref 98–111)
Creatinine, Ser: 0.86 mg/dL (ref 0.61–1.24)
GFR calc Af Amer: >60 mL/min (ref >60)
GFR calc non Af Amer: >60 mL/min (ref >60)
Glucose, Bld: 98 mg/dL (ref 70–99)
Potassium: 3.4 mmol/L — ABNORMAL LOW (ref 3.5–5.1)
Sodium: 142 mmol/L (ref 135–145)

## 2018-09-07 LAB — MAGNESIUM: Magnesium: 1.9 mg/dL (ref 1.7–2.4)

## 2018-09-07 MED ORDER — PANTOPRAZOLE SODIUM 40 MG PO TBEC: 40.0000 mg | DELAYED_RELEASE_TABLET | Freq: Every day | ORAL | 0 refills | Status: DC

## 2018-09-07 MED ORDER — POTASSIUM CHLORIDE CRYS ER 20 MEQ PO TBCR
40.0000 meq | EXTENDED_RELEASE_TABLET | Freq: Once | ORAL | Status: AC
  Administered 2018-09-07: 40 meq via ORAL
  Filled 2018-09-07: qty 2

## 2018-09-07 NOTE — Discharge Instructions (Signed)

## 2018-09-07 NOTE — Evaluation (Addendum)
Physical Therapy Evaluation Patient Details Name: Jared [name] MRN: [id number] DOB: 04/11/1956 Today's Date: 09/07/2018   History of Present Illness  63 yo admitted with SOB and dark stools. colonoscopy 3/28. PMhx: COPD on home O2, CAD, CHF, CVA, HTN, Aflutter  Clinical Impression  PT pleasant on arrival reports using 3L O2 at all times and only walking limited distance due to fatigue. Pt lives in a boarding home and reports sister assists with grocery shopping he does limited cooking and cleaning and typically doesn't leave the home. Pt reports fatigue and weakness limiting function but pt with 5/5 bil LE strength and able to walk without assist with 4L and cues for breathing technique. Educated pt for need for pulse ox for home use as well as walking program for home. Pt with decreased activity tolerance and function who will benefit from acute therapy to maximize mobility, safety and function. SpO2 94% on 2L at rest with drop to 88% on 3L with gait but able to maintain 93% on 4L during activity, HR 77-88.     Follow Up Recommendations No PT follow up    Equipment Recommendations  None recommended by PT    Recommendations for Other Services       Precautions / Restrictions Precautions Precaution Comments: watch sats      Mobility  Bed Mobility Overal bed mobility: Independent                Transfers Overall transfer level: Independent                  Ambulation/Gait Ambulation/Gait assistance: Supervision Gait Distance (Feet): 300 Feet Assistive device: None Gait Pattern/deviations: WFL(Within Functional Limits)   Gait velocity interpretation: >2.62 ft/sec, indicative of community ambulatory General Gait Details: cues for breathing technique and need for increased O2 to 4L with gait  Stairs            Wheelchair Mobility    Modified Rankin (Stroke Patients Only)       Balance Overall balance assessment: No apparent balance deficits (not  formally assessed)                                           Pertinent Vitals/Pain Pain Assessment: No/denies pain    Home Living Family/patient expects to be discharged to:: Other (Comment)(boarding house) Living Arrangements: Alone Available Help at Discharge: Family;Available PRN/intermittently Type of Home: House Home Access: Stairs to enter Entrance Stairs-Rails: Right Entrance Stairs-Number of Steps: 4 Home Layout: One level Home Equipment: None      Prior Function Level of Independence: Independent         Comments: pt reports limited tolerance for gait     Hand Dominance        Extremity/Trunk Assessment   Upper Extremity Assessment Upper Extremity Assessment: Overall WFL for tasks assessed    Lower Extremity Assessment Lower Extremity Assessment: Overall WFL for tasks assessed(5/5 strength bil LE)    Cervical / Trunk Assessment Cervical / Trunk Assessment: Normal  Communication   Communication: No difficulties  Cognition Arousal/Alertness: Awake/alert Behavior During Therapy: WFL for tasks assessed/performed Overall Cognitive Status: Within Functional Limits for tasks assessed                                        General  Comments      Exercises General Exercises - Lower Extremity Long Arc Quad: AROM;Seated;10 reps;Both Hip Flexion/Marching: AROM;10 reps;Seated;Both   Assessment/Plan    PT Assessment Patient needs continued PT services  PT Problem List Decreased activity tolerance;Cardiopulmonary status limiting activity       PT Treatment Interventions Gait training;Functional mobility training;Therapeutic activities;Therapeutic exercise;Stair training;Patient/family education    PT Goals (Current goals can be found in the Care Plan section)  Acute Rehab PT Goals Patient Stated Goal: 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) Time For Goal Achievement: 09/14/18 Potential to Achieve Goals:  Good    Frequency Min 3X/week   Barriers to discharge Decreased caregiver support      Co-evaluation               AM-PAC PT "6 Clicks" Mobility  Outcome Measure Help needed turning from your back to your side while in a flat bed without using bedrails?: None Help needed moving from lying on your back to sitting on the side of a flat bed without using bedrails?: None Help needed moving to and from a bed to a chair (including a wheelchair)?: None Help needed standing up from a chair using your arms (e.g., wheelchair or bedside chair)?: None Help needed to walk in hospital room?: A Little Help needed climbing 3-5 steps with a railing? : A Little 6 Click Score: 22    End of Session Equipment Utilized During Treatment: Oxygen Activity Tolerance: Patient tolerated treatment well Patient left: in chair;with call bell/phone within reach Nurse Communication: Mobility status PT Visit Diagnosis: Other abnormalities of gait and mobility (R26.89)    Time: 8938-1017 PT Time Calculation (min) (ACUTE ONLY): 20 min   Charges:   PT Evaluation $PT Eval Moderate Complexity: 1 Mod          Tamecka Milham Abner Greenspan, PT Acute Rehabilitation Services Pager: [phone] Office: [phone]   Rainy Rothman B Ellamay Fors 09/07/2018, 10:51 AM

## 2018-09-07 NOTE — Discharge Summary (Signed)
Physician Discharge Summary  Tibbie JQZ:[id number] DOB: Nov 18, 1955 DOA: 09/04/2018  PCP: [name], MD  Admit date: 09/04/2018 Discharge date: 09/07/2018  Admitted From: Home Disposition: Home  Recommendations for Outpatient Follow-up:  1. Follow up with PCP in 1 week with repeat CBC/BMP 2. Outpatient follow-up with GI 3. Follow up in ED if symptoms worsen or new appear 4. Abstain from smoking.   Home Health: No Equipment/Devices: Oxygen via nasal cannula which patient normally uses at home.  Discharge Condition: Stable CODE STATUS: Full Diet recommendation: Heart healthy  Brief/Interim Summary: 63 year old male with history of COPD, chronic hypoxic respiratory failure on 3 L oxygen via nasal cannula, CAD, combined CHF, CVA without residual deficit, hypertension, tobacco use disorder, atrial flutter on Xarelto presented with shortness of breath for 3 days along with dark stools for 2 weeks.  His hemoglobin was found to be 4.9 (was 14 in 05/2018) and FOBT was positive.  He was started on IV Protonix.  GI was consulted.  He was transfused with packed red cells.  He had EGD and colonoscopy and subsequent capsule endoscopy.  Capsule endoscopy can be followed up as an outpatient.  GI has cleared the patient for discharge.   Discharge Diagnoses:  Principal Problem:   Symptomatic anemia Active Problems:   Essential hypertension   CAD (coronary artery disease)   COPD with acute exacerbation (HCC)   Atrial flutter (HCC)   Tobacco use disorder   Iron deficiency anemia  Symptomatic anemia due to upper GI bleeding -Presented with hemoglobin of 4.9 with dark stools and FOBT positive.   -Status post 4 units packed red cells transfusion.  Hemoglobin 8.0 this morning -Status post EGD on 09/05/2018 which showed mild esophagitis.   -Continue oral Protonix 40 mg once a day.   -Status post colonoscopy on 09/06/2018: Colon was normal.  Patient also had capsule endoscopy.  Results can be  followed up as an outpatient.  GI has cleared the patient for discharge.  No black or bloody stools overnight.  Hemodynamically stable.  Discharge patient home.    Chronic COPD with chronic hypoxic respiratory failure -Doubt that patient has COPD exacerbation at this time.  Dyspnea is probably secondary to anemia -Continue home regimen.  Continue oxygen via nasal cannula.  Outpatient follow-up with pulmonary  Atypical chest pain -Likely due to anemia.  Troponin negative.  EKG was reassuring  Chronic combined CHF -Echo in 04/2018 showed EF of 45 to 50% with grade 1 diastolic dysfunction -Appears euvolemic -Continue Lasix. -Continue Coreg.    Resume losartan.  Outpatient follow-up with cardiology  Paroxysmal atrial flutter -Currently rate controlled and in sinus rhythm.  Xarelto held during the hospitalization.  Resume Xarelto on discharge, GI has cleared the patient to be restarted on Xarelto. Continue Coreg.  Outpatient follow-up with cardiology  Discharge Instructions  Discharge Instructions    Ambulatory referral to Gastroenterology   Complete by:  As directed    GI bleed followup   What is the reason for referral?:  Other   Call MD for:  difficulty breathing, headache or visual disturbances   Complete by:  As directed    Call MD for:  extreme fatigue   Complete by:  As directed    Call MD for:  persistant dizziness or light-headedness   Complete by:  As directed    Call MD for:  persistant nausea and vomiting   Complete by:  As directed    Call MD for:  severe uncontrolled pain   Complete  by:  As directed    Call MD for:  temperature >100.4   Complete by:  As directed    Diet - low sodium heart healthy   Complete by:  As directed    Increase activity slowly   Complete by:  As directed      Allergies as of 09/07/2018      Reactions   Lisinopril Cough      Medication List    STOP taking these medications   amoxicillin-clavulanate 875-125 MG tablet Commonly known  as:  Augmentin   docusate sodium 100 MG capsule Commonly known as:  COLACE     TAKE these medications   albuterol 108 (90 Base) MCG/ACT inhaler Commonly known as:  PROVENTIL HFA;VENTOLIN HFA Inhale 2 puffs into the lungs every 6 (six) hours as needed for wheezing or shortness of breath.   atorvastatin 80 MG tablet Commonly known as:  LIPITOR Take 1 tablet (80 mg total) by mouth daily. What changed:  Another medication with the same name was added. Make sure you understand how and when to take each.   atorvastatin 80 MG tablet Commonly known as:  LIPITOR Take 1 tablet (80 mg total) by mouth daily. What changed:  You were already taking a medication with the same name, and this prescription was added. Make sure you understand how and when to take each.   carvedilol 3.125 MG tablet Commonly known as:  Coreg Take 1 tablet (3.125 mg total) by mouth 2 (two) times daily.   cetirizine 10 MG tablet Commonly known as:  ZYRTEC Take 1 tablet (10 mg total) by mouth daily.   fluticasone furoate-vilanterol 100-25 MCG/INH Aepb Commonly known as:  Breo Ellipta Inhale 1 puff into the lungs daily.   furosemide 40 MG tablet Commonly known as:  Lasix Take 1 tablet (40 mg total) by mouth daily.   gabapentin 300 MG capsule Commonly known as:  NEURONTIN Take 1 capsule (300 mg total) by mouth at bedtime.   losartan 50 MG tablet Commonly known as:  COZAAR Take 1 tablet (50 mg total) by mouth daily.   OXYGEN Place 2 L into the nose daily. 2lpm when needed per pt   pantoprazole 40 MG tablet Commonly known as:  PROTONIX Take 1 tablet (40 mg total) by mouth daily at 6 (six) AM.   rivaroxaban 20 MG Tabs tablet Commonly known as:  XARELTO Take 20 mg by mouth daily with supper. What changed:  Another medication with the same name was removed. Continue taking this medication, and follow the directions you see here.      Follow-up Information    [facility]  Follow up on 09/17/2018.   Why:  telephone visit scheduled for 09/17/2018 at 4:30 pm with Dr. Alvis Lemmings with repeat CBC/BMP Contact information: [address] [place] [address] [phone]       Tressia Danas, MD. Schedule an appointment as soon as possible for a visit in 1 week(s).   Specialty:  Gastroenterology Contact information: [address] [place] [address] [phone]          Allergies  Allergen Reactions  . Lisinopril Cough    Consultations:  GI   Procedures/Studies: Dg Chest Portable 1 View  Result Date: 09/04/2018 CLINICAL DATA:  Central chest pain. Shortness of breath and cough for 1 week. EXAM: PORTABLE CHEST 1 VIEW COMPARISON:  None. FINDINGS: Cardiac silhouette is normal in size. No mediastinal or hilar masses. No evidence of adenopathy.  Lungs are hyperexpanded with mildly prominent bronchovascular and interstitial markings, stable from the prior exam. Lungs are otherwise clear. No pleural effusion or pneumothorax. Skeletal structures are grossly intact. IMPRESSION: 1. No acute cardiopulmonary disease. 2. Lung hyperexpansion consistent with COPD. Electronically Signed   By: Amie Portland M.D.   On: 09/04/2018 16:56     EGD on 09/05/2018 Impression: - Esophagogastric landmarks identified. - 2 cm hiatal hernia. - Z-line irregular as outlined. - Mild esophagitis. - Normal stomach. - Normal duodenal bulb and second portion of the  duodenum. No obvious cause for severe anemia on this exam.  Recommend colonoscopy to further evaluate. Recommendation: - Return patient to hospital ward for ongoing care. - Clear liquid diet in preparation for colonoscopy   tomorrow - continue to hold Xarelto - continue present medications. - bowel prep tonight, colonoscopy tomorrow with Dr.  Orvan Falconer - start protonix  once daily for esophagitis,  consider repeat EGD as outpatient to evaluate for  Barrett's  Colonoscopy on 09/06/2018 Findings: The perianal and digital rectal examinations were normal. Non-bleeding  internal hemorrhoids seen. The entire examined colon appeared normal on direct and retroflexion  views. No blood present in the colon. Approximately 10 cm of distal  terminal ileum was evaluated. No blood present. No mucosal abnormalities. Impression: - The entire examined colon is normal on direct and  retroflexion views. - Small internal hemorrhoids seen. - No specimens collected. - No source of GI blood loss anemia identified on  this examination. Recommendation: - Return patient to hospital ward for ongoing care. - Continue present medications. - Continue serial hemoglobin/hematocrit with  transfusion as indicated. - To visualize the small bowel, video capsule  endoscopy today. The patient was consented in the  recovery room and swallowed the capsule prior to  his return to the hospital ward. - Post- capsule endoscopy diet orders were entered  in EPIC.  Subjective: Patient seen and examined at bedside.  She denies any  overnight fever, nausea, vomiting, black or bloody stools.  Discharge Exam: Vitals:   09/07/18 0451 09/07/18 0749  BP: 139/80   Pulse: 68   Resp: 19   Temp: 98.9 F (37.2 C)   SpO2: 100% 99%    General: Pt is alert, awake, not in acute distress Cardiovascular: rate controlled, S1/S2 + Respiratory: bilateral decreased breath sounds at bases, some scattered crackles Abdominal: Soft, NT, ND, bowel sounds + Extremities: Trace edema, no cyanosis    The results of significant diagnostics from this hospitalization (including imaging, microbiology, ancillary and laboratory) are listed below for reference.     Microbiology: No results found for this or any previous visit (from the past 240 hour(s)).   Labs: BNP (last 3 results) Recent Labs    05/12/18 2124 05/30/18 0350 09/04/18 1629  BNP 208.9* 187.5* 75.2   Basic Metabolic Panel: Recent Labs  Lab 09/04/18 1629 09/04/18 1646 09/05/18 0253 09/06/18 0550 09/07/18 0358  NA 139 139 141 141 142  K 4.6 4.5 4.3 3.9 3.4*  CL 96*  --  96* 97* 99  CO2 35*  --  36* 37* 37*  GLUCOSE 101*  --  171* 111* 98  BUN 16  --  CREATININE 0.95  --  0.82 0.96 0.86  CALCIUM 8.5*  --  8.8* 8.7* 8.3*  MG  --   --   --  2.0 1.9   Liver Function Tests: Recent Labs  Lab 09/04/18 1629  AST 34  ALT 16  ALKPHOS 46  BILITOT 0.7  PROT 5.7*  ALBUMIN 3.4*   No results for input(s): LIPASE, AMYLASE in the last 168 hours. No results for input(s): AMMONIA in the last 168 hours. CBC: Recent Labs  Lab 09/04/18 1629  09/05/18 0253 09/05/18 0956 09/05/18 1626 09/06/18 0550 09/06/18 1637 09/07/18 0358  WBC 9.3  --  7.9  --   --   --   --  10.1  NEUTROABS 4.2  --   --   --   --   --   --  5.7  HGB 4.9*   < > 6.0* 6.8* 8.3* 7.5* 7.7* 8.0*  HCT 17.8*   < > 20.5* 22.7* 27.4* 25.2* 26.3* 27.3*  MCV 88.6  --  85.4  --   --   --   --  86.9  PLT 196  --  174  --   --   --   --  188   < > = values in this interval not displayed.    Cardiac Enzymes: Recent Labs  Lab 09/04/18 1629  TROPONINI <0.03   BNP: Invalid input(s): POCBNP CBG: No results for input(s): GLUCAP in the last 168 hours. D-Dimer No results for input(s): DDIMER in the last 72 hours. Hgb A1c No results for input(s): HGBA1C in the last 72 hours. Lipid Profile No results for input(s): CHOL, HDL, LDLCALC, TRIG, CHOLHDL, LDLDIRECT in the last 72 hours. Thyroid function studies No results for input(s): TSH, T4TOTAL, T3FREE, THYROIDAB in the last 72 hours.  Invalid input(s): FREET3 Anemia work up Recent Labs    09/05/18 0253  VITAMINB12 576  FOLATE 20.5  FERRITIN 3*  TIBC 358  IRON 9*  RETICCTPCT 2.5   Urinalysis    Component Value Date/Time   COLORURINE YELLOW 07/25/2016 1845   APPEARANCEUR HAZY (A) 07/25/2016 1845   LABSPEC 1.017 07/25/2016 1845   PHURINE 5.0 07/25/2016 1845   GLUCOSEU 50 (A) 07/25/2016 1845   HGBUR SMALL (A) 07/25/2016 1845   BILIRUBINUR NEGATIVE 07/25/2016 1845   KETONESUR NEGATIVE 07/25/2016 1845   PROTEINUR 100 (A) 07/25/2016 1845   NITRITE NEGATIVE 07/25/2016 1845   LEUKOCYTESUR NEGATIVE 07/25/2016 1845   Sepsis Labs Invalid input(s): PROCALCITONIN,  WBC,  LACTICIDVEN Microbiology No results found for this or any previous visit (from the past 240 hour(s)).   Time coordinating discharge: 35 minutes  SIGNED:   Glade Lloyd, MD  [facility] 09/07/2018, 9:01 AM

## 2018-09-07 NOTE — Progress Notes (Addendum)
Referring Provider: Dr Hanley Ben  Primary Care Physician:  [name], MD Primary Gastroenterologist:  Dr. Myrtie Neither   Chief complaint: Unexplained anemia   IMPRESSION:  Symptomatic iron deficiency with occult GI blood loss    -No source identified on EGD 3/27 or colonoscopy 3/28    -Capsule endoscopy results from 09/06/2018 pending Mild esophagitis with irregular z-line on EGD 09/05/18 Chronic Xarelto use for history of CVA, CAD, and atrial flutter Normal colonoscopy 09/06/2018    -Repeat colonoscopy recommended in 2030 unless new symptoms develop prior to that time Family history of colon cancer in his mother (age unknown)  Hemoglobin remained stable.  No overt bleeding.   PLAN: Discussed with Dr. Hanley Ben, agree with plans to discharge to home Will communicate capsule endoscopy results with the patient this week Protonix 40 mg daily Consider EGD in 8 weeks to evaluate for possible underlying Barrett's esophagus OK to resume Xarelto with close follow-up  HPI: No overt bleeding.  GI review of systems is negative.  No new complaints or concerns at this time.  Wants to go home.   Past Medical History:  Diagnosis Date  . CAD (coronary artery disease)    a. LHC 5/12:  LAD 20, pLCx 20, pRCA 40, dRCA 40, EF 35%, diff HK  //  b. Myoview 4/16: Overall Impression: High risk stress nuclear study There is no evidence of ischemia. There is severe LV dysfunction. LV Ejection Fraction: 30%. LV Wall Motion: There is global LV hypokinesis.   Marland Kitchen CAP (community acquired pneumonia) 09/2013  . Chronic combined systolic and diastolic CHF (congestive heart failure) (HCC)    a. Echo 4/16:Mild LVH, EF 40-45%, diffuse HK //  b. Echo 8/17: EF 35-40%, diffuse HK, diastolic dysfunction, aortic sclerosis, trivial MR, moderate LAE, normal RVSF, moderate RAE, mild TR, PASP 42 mmHg // c. Echo 4/18: Mild concentric LVH, EF 30-35, normal wall motion, grade 1 diastolic dysfunction, PASP 49  . Cluster headache    "hx; haven't had one in awhile" (01/09/2016)  . COPD (chronic obstructive pulmonary disease) (HCC)    [name] 01/09/2016  . History of CVA (cerebrovascular accident)   . Hypertension   . NICM (nonischemic cardiomyopathy) (HCC)   . Tobacco abuse     Past Surgical History:  Procedure Laterality Date  . CARDIAC CATHETERIZATION  10/2010   LM normal, LAD with 20% irregularities, LCX with 20%, RCA with 40% prox and 40% distal - EF of 35%  . COLONOSCOPY W/ BIOPSIES AND POLYPECTOMY    . EXCISION MASS HEAD    . INCISION AND DRAINAGE PERIRECTAL ABSCESS N/A 06/05/2017   Procedure: IRRIGATION AND DEBRIDEMENT PERIRECTAL ABSCESS;  Surgeon: Andria Meuse, MD;  Location: [facility] OR;  Service: General;  Laterality: N/A;  . VIDEO BRONCHOSCOPY Bilateral 05/08/2016   Procedure: VIDEO BRONCHOSCOPY WITH FLUORO;  Surgeon: Oretha Milch, MD;  Location: [facility] ENDOSCOPY;  Service: Cardiopulmonary;  Laterality: Bilateral;    Prior to Admission medications   Medication Sig Start Date End Date Taking? Authorizing Provider  albuterol (PROVENTIL HFA;VENTOLIN HFA) 108 (90 Base) MCG/ACT inhaler Inhale 2 puffs into the lungs every 6 (six) hours as needed for wheezing or shortness of breath. 05/15/18  Yes [name], MD  atorvastatin (LIPITOR) 80 MG tablet Take 1 tablet (80 mg total) by mouth daily. 05/15/18  Yes [name], MD  carvedilol (COREG) 3.125 MG tablet Take 1 tablet (3.125 mg total) by mouth 2 (two) times daily. 05/15/18 05/15/19 Yes [name], MD  cetirizine (ZYRTEC) 10 MG tablet  Take 1 tablet (10 mg total) by mouth daily. 06/18/18  Yes Newlin, Enobong, MD  fluticasone furoate-vilanterol (BREO ELLIPTA) 100-25 MCG/INH AEPB Inhale 1 puff into the lungs daily. 05/15/18  Yes [name], MD  furosemide (LASIX) 40 MG tablet Take 1 tablet (40 mg total) by mouth daily. 05/16/18 05/16/19 Yes [name], MD  gabapentin (NEURONTIN) 300 MG capsule Take 1 capsule (300 mg total) by mouth at bedtime. 06/18/18  Yes  [name], MD  losartan (COZAAR) 50 MG tablet Take 1 tablet (50 mg total) by mouth daily. 05/15/18  Yes [name], MD  OXYGEN Place 2 L into the nose daily. 2lpm when needed per pt   Yes [provider]  rivaroxaban (XARELTO) 20 MG TABS tablet Take 1 tablet (20 mg total) by mouth daily with supper. 05/15/18  Yes [name], MD  rivaroxaban (XARELTO) 20 MG TABS tablet Take 20 mg by mouth daily with supper.   Yes [provider]  acetaminophen (TYLENOL) 325 MG tablet Take 2 tablets (650 mg total) by mouth every 6 (six) hours as needed for up to 10 days for mild pain (or Fever >/= 101). 09/06/18 09/16/18  Tressia Danas, MD  acetaminophen (TYLENOL) 650 MG suppository Place 1 suppository (650 mg total) rectally every 6 (six) hours as needed for mild pain (or Fever >/= 101). 09/06/18   Tressia Danas, MD  amoxicillin-clavulanate (AUGMENTIN) 875-125 MG tablet Take 1 tablet by mouth 2 (two) times daily. One po bid x 7 days Patient not taking: Reported on 09/06/2018 07/30/18   Zadie Rhine, MD  atorvastatin (LIPITOR) 80 MG tablet Take 1 tablet (80 mg total) by mouth daily. 09/06/18   Tressia Danas, MD  docusate sodium (COLACE) 100 MG capsule Take 1 capsule (100 mg total) by mouth every 12 (twelve) hours. Patient not taking: Reported on 03/30/2018 06/02/17   Roxy Horseman, PA-C  pantoprazole (PROTONIX) 40 MG tablet Take 1 tablet (40 mg total) by mouth daily at 6 (six) AM. 09/07/18   Tressia Danas, MD  traZODone (DESYREL) 50 MG tablet Take 0.5 tablets (25 mg total) by mouth at bedtime as needed for sleep. 09/06/18   Tressia Danas, MD    Current Facility-Administered Medications  Medication Dose Route Frequency Provider Last Rate Last Dose  . 0.9 %  sodium chloride infusion (Manually program via Guardrails IV Fluids)   Intravenous Once Tressia Danas, MD      . acetaminophen (TYLENOL) tablet 650 mg  650 mg Oral Q6H PRN Tressia Danas, MD       Or  .  acetaminophen (TYLENOL) suppository 650 mg  650 mg Rectal Q6H PRN Tressia Danas, MD      . albuterol (PROVENTIL) (2.5 MG/3ML) 0.083% nebulizer solution 2.5 mg  2.5 mg Nebulization Q2H PRN Tressia Danas, MD      . atorvastatin (LIPITOR) tablet 80 mg  80 mg Oral Daily Tressia Danas, MD   80 mg at 09/07/18 0846  . carvedilol (COREG) tablet 3.125 mg  3.125 mg Oral BID WC Tressia Danas, MD   3.125 mg at 09/07/18 0846  . fluticasone furoate-vilanterol (BREO ELLIPTA) 100-25 MCG/INH 1 puff  1 puff Inhalation Daily Tressia Danas, MD   1 puff at 09/07/18 0749  . furosemide (LASIX) tablet 40 mg  40 mg Oral Daily Glade Lloyd, MD   40 mg at 09/07/18 0846  . gabapentin (NEURONTIN) capsule 300 mg  300 mg Oral Daily Tressia Danas, MD   300 mg at 09/07/18 0846  . oxyCODONE (Oxy IR/ROXICODONE)  immediate release tablet 5 mg  5 mg Oral Q4H PRN Tressia Danas, MD      . pantoprazole (PROTONIX) EC tablet 40 mg  40 mg Oral Q0600 Tressia Danas, MD   40 mg at 09/07/18 0555  . promethazine (PHENERGAN) tablet 12.5 mg  12.5 mg Oral Q6H PRN Tressia Danas, MD      . traZODone (DESYREL) tablet 25 mg  25 mg Oral QHS PRN Tressia Danas, MD        Allergies as of 09/04/2018 - Review Complete 09/04/2018  Allergen Reaction Noted  . Lisinopril Cough 06/29/2014    Family History  Problem Relation Age of Onset  . Heart disease Mother   . Diabetes Mother   . Colon cancer Mother   . Liver cancer Mother   . Cancer Father        type unknown  . Diabetes Sister        x 2  . Diabetes Brother     Social History   Socioeconomic History  . Marital status: Single    Spouse name: Not on file  . Number of children: 1  . Years of education: Not on file  . Highest education level: Not on file  Occupational History  . Occupation: retired  Engineer, production  . Financial resource strain: Not on file  . Food insecurity:    Worry: Not on file    Inability: Not on file  . Transportation  needs:    Medical: Not on file    Non-medical: Not on file  Tobacco Use  . Smoking status: Current Every Day Smoker    Packs/day: 0.25    Years: 42.00    Pack years: 10.50    Types: Cigarettes  . Smokeless tobacco: Never Used  . Tobacco comment: 3 cigs daily  Substance and Sexual Activity  . Alcohol use: Yes    Alcohol/week: 0.0 standard drinks    Comment: last drink was before xmas  . Drug use: No    Types: Cocaine, Marijuana    Comment: "nothing in 20 years"  . Sexual activity: Not Currently  Lifestyle  . Physical activity:    Days per week: Not on file    Minutes per session: Not on file  . Stress: Not on file  Relationships  . Social connections:    Talks on phone: Not on file    Gets together: Not on file    Attends religious service: Not on file    Active member of club or organization: Not on file    Attends meetings of clubs or organizations: Not on file    Relationship status: Not on file  . Intimate partner violence:    Fear of current or ex partner: Not on file    Emotionally abused: Not on file    Physically abused: Not on file    Forced sexual activity: Not on file  Other Topics Concern  . Not on file  Social History Narrative   unemployed     Physical Exam: Temp:  [98.1 F (36.7 C)-98.9 F (37.2 C)] 98.9 F (37.2 C) (03/29 0451) Pulse Rate:  [68-75] 68 (03/29 0451) Resp:  [18-20] 19 (03/29 0451) BP: (113-139)/(66-80) 139/80 (03/29 0451) SpO2:  [99 %-100 %] 99 % (03/29 0749) Weight:  [92.1 kg] 92.1 kg (03/29 0451) Last BM Date: 09/05/18 General:   Alert,  well-nourished, pleasant and cooperative in NAD Abdomen:  Soft,nontender, nondistended, normal bowel sounds, no rebound or guarding. No hepatosplenomegaly.  Lab Results: Recent Labs    09/04/18 1629  09/05/18 0253  09/06/18 0550 09/06/18 1637 09/07/18 0358  WBC 9.3  --  7.9  --   --   --  10.1  HGB 4.9*   < > 6.0*   < > 7.5* 7.7* 8.0*  HCT 17.8*   < > 20.5*   < > 25.2* 26.3*  27.3*  PLT 196  --  174  --   --   --  188   < > = values in this interval not displayed.   BMET Recent Labs    09/05/18 0253 09/06/18 0550 09/07/18 0358  NA 141 141 142  K 4.3 3.9 3.4*  CL 96* 97* 99  CO2 36* 37* 37*  GLUCOSE 171* 111* 98  BUN 13 10 9   CREATININE 0.82 0.96 0.86  CALCIUM 8.8* 8.7* 8.3*   LFT Recent Labs    09/04/18 1629  PROT 5.7*  ALBUMIN 3.4*  AST 34  ALT 16  ALKPHOS 46  BILITOT 0.7   PT/INR Recent Labs    09/05/18 0253  LABPROT 18.4*  INR 1.6*   Hepatitis Panel No results for input(s): HEPBSAG, HCVAB, HEPAIGM, HEPBIGM in the last 72 hours.    Studies/Results: No results found.    Staceyann Knouff L. Orvan Falconer, MD, MPH 09/07/2018, 8:52 AM

## 2018-09-07 NOTE — TOC Transition Note (Signed)
Transition of Care [facility]) - CM/SW Discharge Note   Patient Details  Name: Jared [name] MRN: [id number] Date of Birth: [birth year]  [facility] CM/SW Contact:  Lawerance Sabal, RN Phone Number: 09/07/2018, 10:06 AM   Clinical Narrative:     Spoke w patient at bedside. He states that he can get a friend to come get him today. Requested an oxygen tank for transport home to be delivered to room.   Final next level of care: Home/Self Care Barriers to Discharge: No Barriers Identified   Patient Goals and CMS Choice Patient states their goals for this hospitalization and ongoing recovery are:: to be able to cook, get groceries, increase my strength CMS Medicare.gov Compare Post Acute Care list provided to:: Patient Choice offered to / list presented to : Patient  Discharge Placement                       Discharge Plan and Services In-house Referral: Clinical Social Work(? transportation to home) Discharge Planning Services: CM Consult, Follow-up appt scheduled, [facility] Post Acute Care Choice: Home Health                [facility] Agency: [facility]   Social Determinants of Health (SDOH) Interventions     Readmission Risk Interventions No flowsheet data found.

## 2018-09-07 NOTE — Progress Notes (Signed)
Nsg Discharge Note  Admit Date:  09/04/2018 Discharge date: 09/07/2018   Jared [name] to be D/C'd Home per MD order.  AVS completed.  Copy for chart, and copy for patient signed, and dated. Patient/caregiver able to verbalize understanding.  Discharge Medication: Allergies as of 09/07/2018      Reactions   Lisinopril Cough      Medication List    STOP taking these medications   amoxicillin-clavulanate 875-125 MG tablet Commonly known as:  Augmentin   docusate sodium 100 MG capsule Commonly known as:  COLACE     TAKE these medications   albuterol 108 (90 Base) MCG/ACT inhaler Commonly known as:  PROVENTIL HFA;VENTOLIN HFA Inhale 2 puffs into the lungs every 6 (six) hours as needed for wheezing or shortness of breath.   atorvastatin 80 MG tablet Commonly known as:  LIPITOR Take 1 tablet (80 mg total) by mouth daily. What changed:  Another medication with the same name was added. Make sure you understand how and when to take each.   atorvastatin 80 MG tablet Commonly known as:  LIPITOR Take 1 tablet (80 mg total) by mouth daily. What changed:  You were already taking a medication with the same name, and this prescription was added. Make sure you understand how and when to take each.   carvedilol 3.125 MG tablet Commonly known as:  Coreg Take 1 tablet (3.125 mg total) by mouth 2 (two) times daily.   cetirizine 10 MG tablet Commonly known as:  ZYRTEC Take 1 tablet (10 mg total) by mouth daily.   fluticasone furoate-vilanterol 100-25 MCG/INH Aepb Commonly known as:  Breo Ellipta Inhale 1 puff into the lungs daily.   furosemide 40 MG tablet Commonly known as:  Lasix Take 1 tablet (40 mg total) by mouth daily.   gabapentin 300 MG capsule Commonly known as:  NEURONTIN Take 1 capsule (300 mg total) by mouth at bedtime.   losartan 50 MG tablet Commonly known as:  COZAAR Take 1 tablet (50 mg total) by mouth daily.   OXYGEN Place 2 L into the nose daily. 2lpm when  needed per pt   pantoprazole 40 MG tablet Commonly known as:  PROTONIX Take 1 tablet (40 mg total) by mouth daily at 6 (six) AM.   rivaroxaban 20 MG Tabs tablet Commonly known as:  XARELTO Take 20 mg by mouth daily with supper. What changed:  Another medication with the same name was removed. Continue taking this medication, and follow the directions you see here.       Discharge Assessment: Vitals:   09/07/18 0749 09/07/18 1002  BP:    Pulse:  88  Resp:    Temp:    SpO2: 99% 93%   Skin clean, dry and intact without evidence of skin break down, no evidence of skin tears noted. IV catheter discontinued intact. Site without signs and symptoms of complications - no redness or edema noted at insertion site, patient denies c/o pain - only slight tenderness at site.  Dressing with slight pressure applied.  D/c Instructions-Education: Discharge instructions given to patient/family with verbalized understanding. D/c education completed with patient/family including follow up instructions, medication list, d/c activities limitations if indicated, with other d/c instructions as indicated by MD - patient able to verbalize understanding, all questions fully answered. Patient instructed to return to ED, call 911, or call MD for any changes in condition.  Patient escorted via WC, and D/C home via private auto.  Jared N Ailey Wessling, RN 09/07/2018 2:32 PM

## 2018-09-08 ENCOUNTER — Telehealth: Payer: Self-pay

## 2018-09-08 NOTE — Telephone Encounter (Signed)
Transition Care Management Follow-up Telephone Call  Date of discharge and from where: 09/07/2018, [facility].   How have you been since you were released from the hospital? He stated that he is doing " all right."   Any questions or concerns? He said that the has no questions/concerns.   Items Reviewed:  Did the pt receive and understand the discharge instructions provided? He said that he has the discharge instructions and medication list and has reviewed them and does not have any questions.   Medications obtained and verified? He said that he has all medications including the pantoprazole. He also said that he is aware of the changes with his medications including those that he is to stop taking.   Any new allergies since your discharge?none reported   Do you have support at home? He lives in a boarding house. His sister, [name], provides assistance when she is able.   Other (ie: DME, Home Health, etc) he does not have a scale.   No home health services  He has O2 from [facility], and stated that he uses it @ 3L continuously.  Functional Questionnaire: (I = Independent and D = Dependent) ADL's: independent   Follow up appointments reviewed:    PCP Hospital f/u appt confirmed? Appointment 09/17/2018 @ 0830 with Dr Alvis Lemmings.  Informed him that he would be contacted if the visit is changed to a telephone visit.   Specialist Hospital f/u appt confirmed? He has a telephone visit with GI tomorrow.   Are transportation arrangements needed? He said that he may need transportation.  Reminded him that the city bus is free.   If their condition worsens, is the pt aware to call  their PCP or go to the ED?  Yes   Was the patient provided with contact information for the PCP's office or ED? He has the phone number for the [facility]  Was the pt encouraged to call back with questions or concerns? yes

## 2018-09-09 ENCOUNTER — Ambulatory Visit (INDEPENDENT_AMBULATORY_CARE_PROVIDER_SITE_OTHER): Payer: Medicaid Other | Admitting: Gastroenterology

## 2018-09-09 ENCOUNTER — Encounter: Payer: Self-pay | Admitting: Gastroenterology

## 2018-09-09 ENCOUNTER — Other Ambulatory Visit: Payer: Self-pay

## 2018-09-09 DIAGNOSIS — D509 Iron deficiency anemia, unspecified: Secondary | ICD-10-CM

## 2018-09-09 MED ORDER — FERROUS SULFATE 325 (65 FE) MG PO TABS: 325.0000 mg | ORAL_TABLET | Freq: Every day | ORAL | 1 refills | Status: DC

## 2018-09-09 NOTE — Progress Notes (Signed)
This patient contacted our office requesting a physician telephone consultation regarding clinical questions and/or test results.  He was offered video consultation, but said he lacked the technology for that.   Participants on the phone call myself and the patient  The patient consented to phone consultation and was aware that a charge will be placed through their insurance.  I was in my office and the patient was at home on his phone  This patient is known to me from an office appointment in March 2018 for elevated LFTs.  He has multiple complex medical issues.  Mr. Apley was admitted to the hospital late last week with symptomatic severe anemia with a hemoglobin of 5.  He had reported dark stool perhaps the week prior to that, but he is somewhat vague about that.  It does not sound like it was melena or bright red blood per rectum.  Upper endoscopy and colonoscopy to the terminal ileum were unrevealing for source of bleeding.  He underwent small bowel video capsule study during hospital stay, and I read that capsule study yesterday.  It was reasonably good preparation, there was fast transit time through the small bowel, no source of bleeding seen.  He tells me that today he is feeling relatively well, though still dyspneic with exertion.  He does not have abdominal pain and has not seen black tarry stool or bright red blood per rectum.  His appetite is reasonably good and weight stable.  He is also back on his oral anticoagulation but not on any iron supplements.  He was scheduled for primary care follow-up in the near future, but says he received a notification that the visit would be delayed.  I explained the results of the video capsule study, and how I suspect he probably still had a source of obscure small bowel bleeding that just could not be seen on it.  He does not have chronic kidney disease, so that does not appear to be the source of his iron deficiency anemia.  Ferritin was very low at  3 with an iron saturation of 3% admission labs.  Hemoglobin rose to 8 and remained stable there at the time of discharge.  I recommended that he continue oral anticoagulation, and I sent a prescription for iron sulfate 325 mg twice daily to take for 2 months.  I have asked him to take it once a day to start, and as long as it does not cause constipation or abdominal pain, I would like him to increase it to twice daily after a week until total 7-month period has passed.  I will copy this note to his primary care provider so they can plan to check his iron levels and hemoglobin when they next see him.  Encounter time:  Total time 11 minutes    Amada Jupiter, MD   _____________________________________________________________________________________________

## 2018-09-17 ENCOUNTER — Ambulatory Visit: Payer: Medicaid Other | Attending: Family Medicine | Admitting: Family Medicine

## 2018-09-17 ENCOUNTER — Encounter: Payer: Self-pay | Admitting: Family Medicine

## 2018-09-17 ENCOUNTER — Other Ambulatory Visit: Payer: Self-pay

## 2018-09-17 DIAGNOSIS — I4892 Unspecified atrial flutter: Secondary | ICD-10-CM

## 2018-09-17 DIAGNOSIS — J9611 Chronic respiratory failure with hypoxia: Secondary | ICD-10-CM

## 2018-09-17 DIAGNOSIS — K922 Gastrointestinal hemorrhage, unspecified: Secondary | ICD-10-CM | POA: Diagnosis not present

## 2018-09-17 DIAGNOSIS — I5042 Chronic combined systolic (congestive) and diastolic (congestive) heart failure: Secondary | ICD-10-CM | POA: Diagnosis not present

## 2018-09-17 NOTE — Progress Notes (Signed)
Virtual Visit via Telephone Note  I connected with Jared [name] on 09/17/18 at  8:30 AM EDT by telephone and verified that I am speaking with the correct person using two identifiers.   I discussed the limitations, risks, security and privacy concerns of performing an evaluation and management service by telephone and the availability of in person appointments. I also discussed with the patient that there may be a patient responsible charge related to this service. The patient expressed understanding and agreed to proceed.   History of Present Illness: Jared [name] is a 63 year old male with a history of hypertension, COPD, tobacco abuse, NICM , CHF (EF 45-50% from echo 04/2018  improved from  30-35% in 09/2016), atrial flutter here for a follow-up visit. He had a recent hospitalization for lower GI bleed when he was admitted with symptomatic anemia with hemoglobin of 4.9 and had also complained of dark stools.  Xarelto was held during hospitalization Received 4 units of PRBC.  EGD revealed hiatal hernia, mild esophagitis and Protonix was commenced with plans for repeat EGD as an outpatient..  Colonoscopy revealed small internal hemorrhoids. Hemoglobin improved to 8.0 at discharge.  Today he denies any lower or upper GI bleed, abdominal pain, dyspnea.  He had a telephone visit with his gastroenterologist, Dr. Maurine Minister on 09/09/2018. Compliant with Xarelto and denies acute concerns today.   Observations/Objective: Awake, alert, oriented x3 Not in acute distress  CMP Latest Ref Rng & Units 09/07/2018 09/06/2018 09/05/2018  Glucose 70 - 99 mg/dL 98 427(C) 623(J)  BUN 8 - 23 mg/dL 9 10 13   Creatinine 0.61 - 1.24 mg/dL 6.28 3.15 1.76  Sodium 135 - 145 mmol/L 142 141 141  Potassium 3.5 - 5.1 mmol/L 3.4(L) 3.9 4.3  Chloride 98 - 111 mmol/L 99 97(L) 96(L)  CO2 22 - 32 mmol/L 37(H) 37(H) 36(H)  Calcium 8.9 - 10.3 mg/dL 8.3(L) 8.7(L) 8.8(L)  Total Protein 6.5 - 8.1 g/dL - - -  Total Bilirubin 0.3 - 1.2  mg/dL - - -  Alkaline Phos 38 - 126 U/L - - -  AST 15 - 41 U/L - - -  ALT 0 - 44 U/L - - -    CBC    Component Value Date/Time   WBC 10.1 09/07/2018 0358   RBC 3.14 (L) 09/07/2018 0358   HGB 8.0 (L) 09/07/2018 0358   HCT 27.3 (L) 09/07/2018 0358   PLT 188 09/07/2018 0358   MCV 86.9 09/07/2018 0358   MCH 25.5 (L) 09/07/2018 0358   MCHC 29.3 (L) 09/07/2018 0358   RDW 15.7 (H) 09/07/2018 0358   LYMPHSABS 3.5 09/07/2018 0358   MONOABS 0.8 09/07/2018 0358   EOSABS 0.1 09/07/2018 0358   BASOSABS 0.0 09/07/2018 0358     Assessment and Plan: 1. Chronic combined systolic and diastolic CHF (congestive heart failure) (HCC) EF 45 to 50% from 04/2018 Euvolemic  2. Lower GI bleed Asymptomatic at this time Last hemoglobin was 8.0 on 09/06/1928 We will recheck CBC again - CBC with Differential/Platelet; Future  3. Chronic respiratory failure with hypoxia (HCC) Stable on 2 L of oxygen  4. Atrial flutter, unspecified type (HCC) Continue Xarelto    Follow Up Instructions:    I discussed the assessment and treatment plan with the patient. The patient was provided an opportunity to ask questions and all were answered. The patient agreed with the plan and demonstrated an understanding of the instructions.   The patient was advised to call back or seek an in-person evaluation if  the symptoms worsen or if the condition fails to improve as anticipated.  I provided 11 minutes of non-face-to-face time during this encounter.   [name], MD

## 2018-09-17 NOTE — Progress Notes (Signed)
Patient has been called and DOB has been verified. Patient has been screened and transferred to PCP to start phone visit.  C/C: Hypertention  Refills: none needed  

## 2018-09-22 ENCOUNTER — Ambulatory Visit: Payer: Medicaid Other | Admitting: Family Medicine

## 2018-09-23 MED FILL — CARVEDILOL 3.125 MG TABLET: 3.125 | 30 days supply | Qty: 60 | Fill #3

## 2018-09-23 MED FILL — XARELTO 20 MG TABLET: 20 | 30 days supply | Qty: 30 | Fill #3

## 2018-09-23 MED FILL — ATORVASTATIN 80 MG TABLET: 80 | 30 days supply | Qty: 30 | Fill #3

## 2018-09-23 MED FILL — FUROSEMIDE 40 MG TAB: 40 | 30 days supply | Qty: 30 | Fill #3

## 2018-09-23 MED FILL — LOSARTAN POTASSIUM 50 MG TA: 50 | 30 days supply | Qty: 30 | Fill #3

## 2018-09-23 MED FILL — GABAPENTIN 300 MG CAPSULE: 300 | 30 days supply | Qty: 30 | Fill #3

## 2018-09-24 ENCOUNTER — Ambulatory Visit: Payer: Medicaid Other | Attending: Family Medicine

## 2018-09-24 ENCOUNTER — Other Ambulatory Visit: Payer: Self-pay

## 2018-09-24 DIAGNOSIS — K922 Gastrointestinal hemorrhage, unspecified: Secondary | ICD-10-CM

## 2018-09-25 ENCOUNTER — Other Ambulatory Visit: Payer: Self-pay

## 2018-09-25 ENCOUNTER — Encounter (HOSPITAL_COMMUNITY): Payer: Self-pay

## 2018-09-25 ENCOUNTER — Telehealth: Payer: Self-pay | Admitting: Emergency Medicine

## 2018-09-25 ENCOUNTER — Emergency Department (HOSPITAL_COMMUNITY): Payer: Medicaid Other

## 2018-09-25 ENCOUNTER — Telehealth: Payer: Self-pay

## 2018-09-25 ENCOUNTER — Inpatient Hospital Stay (HOSPITAL_COMMUNITY)
Admission: EM | Admit: 2018-09-25 | Discharge: 2018-09-28 | DRG: 378 | Disposition: A | Discharge status: Home / Self Care | Payer: Medicaid Other | Attending: Family Medicine | Admitting: Family Medicine

## 2018-09-25 DIAGNOSIS — K5520 Angiodysplasia of colon without hemorrhage: Secondary | ICD-10-CM

## 2018-09-25 DIAGNOSIS — D649 Anemia, unspecified: Secondary | ICD-10-CM | POA: Diagnosis not present

## 2018-09-25 DIAGNOSIS — I4892 Unspecified atrial flutter: Secondary | ICD-10-CM | POA: Diagnosis present

## 2018-09-25 DIAGNOSIS — I5042 Chronic combined systolic (congestive) and diastolic (congestive) heart failure: Secondary | ICD-10-CM | POA: Diagnosis not present

## 2018-09-25 DIAGNOSIS — F17210 Nicotine dependence, cigarettes, uncomplicated: Secondary | ICD-10-CM | POA: Diagnosis present

## 2018-09-25 DIAGNOSIS — Z8673 Personal history of transient ischemic attack (TIA), and cerebral infarction without residual deficits: Secondary | ICD-10-CM

## 2018-09-25 DIAGNOSIS — K922 Gastrointestinal hemorrhage, unspecified: Secondary | ICD-10-CM

## 2018-09-25 DIAGNOSIS — Z888 Allergy status to other drugs, medicaments and biological substances status: Secondary | ICD-10-CM

## 2018-09-25 DIAGNOSIS — Z720 Tobacco use: Secondary | ICD-10-CM | POA: Diagnosis present

## 2018-09-25 DIAGNOSIS — E119 Type 2 diabetes mellitus without complications: Secondary | ICD-10-CM | POA: Diagnosis present

## 2018-09-25 DIAGNOSIS — I5043 Acute on chronic combined systolic (congestive) and diastolic (congestive) heart failure: Secondary | ICD-10-CM | POA: Diagnosis present

## 2018-09-25 DIAGNOSIS — K769 Liver disease, unspecified: Secondary | ICD-10-CM | POA: Diagnosis present

## 2018-09-25 DIAGNOSIS — Z716 Tobacco abuse counseling: Secondary | ICD-10-CM

## 2018-09-25 DIAGNOSIS — Z800 Family history of malignant neoplasm of digestive organs: Secondary | ICD-10-CM

## 2018-09-25 DIAGNOSIS — J449 Chronic obstructive pulmonary disease, unspecified: Secondary | ICD-10-CM | POA: Diagnosis present

## 2018-09-25 DIAGNOSIS — Z8249 Family history of ischemic heart disease and other diseases of the circulatory system: Secondary | ICD-10-CM

## 2018-09-25 DIAGNOSIS — Z7951 Long term (current) use of inhaled steroids: Secondary | ICD-10-CM

## 2018-09-25 DIAGNOSIS — J9611 Chronic respiratory failure with hypoxia: Secondary | ICD-10-CM | POA: Diagnosis not present

## 2018-09-25 DIAGNOSIS — E785 Hyperlipidemia, unspecified: Secondary | ICD-10-CM | POA: Diagnosis present

## 2018-09-25 DIAGNOSIS — K31819 Angiodysplasia of stomach and duodenum without bleeding: Secondary | ICD-10-CM

## 2018-09-25 DIAGNOSIS — R251 Tremor, unspecified: Secondary | ICD-10-CM | POA: Diagnosis not present

## 2018-09-25 DIAGNOSIS — D62 Acute posthemorrhagic anemia: Secondary | ICD-10-CM | POA: Diagnosis present

## 2018-09-25 DIAGNOSIS — I2510 Atherosclerotic heart disease of native coronary artery without angina pectoris: Secondary | ICD-10-CM | POA: Diagnosis present

## 2018-09-25 DIAGNOSIS — I420 Dilated cardiomyopathy: Secondary | ICD-10-CM | POA: Diagnosis present

## 2018-09-25 DIAGNOSIS — Z9981 Dependence on supplemental oxygen: Secondary | ICD-10-CM

## 2018-09-25 DIAGNOSIS — R7303 Prediabetes: Secondary | ICD-10-CM | POA: Diagnosis present

## 2018-09-25 DIAGNOSIS — R195 Other fecal abnormalities: Secondary | ICD-10-CM | POA: Diagnosis present

## 2018-09-25 DIAGNOSIS — K5521 Angiodysplasia of colon with hemorrhage: Principal | ICD-10-CM | POA: Diagnosis present

## 2018-09-25 DIAGNOSIS — Z7901 Long term (current) use of anticoagulants: Secondary | ICD-10-CM

## 2018-09-25 DIAGNOSIS — F17200 Nicotine dependence, unspecified, uncomplicated: Secondary | ICD-10-CM | POA: Diagnosis present

## 2018-09-25 DIAGNOSIS — Z79899 Other long term (current) drug therapy: Secondary | ICD-10-CM

## 2018-09-25 DIAGNOSIS — I110 Hypertensive heart disease with heart failure: Secondary | ICD-10-CM | POA: Diagnosis present

## 2018-09-25 LAB — CBC WITH DIFFERENTIAL/PLATELET
Abs Immature Granulocytes: 0 10*3/uL (ref 0.00–0.07)
Basophils Absolute: 0.1 10*3/uL (ref 0.0–0.2)
Basophils Absolute: 0.1 10*3/uL (ref 0.0–0.1)
Basophils Relative: 1 %
Basos: 1 %
EOS (ABSOLUTE): 0.1 10*3/uL (ref 0.0–0.4)
Eos: 1 %
Eosinophils Absolute: 0.1 10*3/uL (ref 0.0–0.5)
Eosinophils Relative: 1 %
HCT: 24 % — ABNORMAL LOW (ref 39.0–52.0)
Hematocrit: 23.5 % — ABNORMAL LOW (ref 37.5–51.0)
Hemoglobin: 6.8 g/dL — CL (ref 13.0–17.7)
Hemoglobin: 6.3 g/dL — CL (ref 13.0–17.0)
Immature Grans (Abs): 0.1 10*3/uL (ref 0.0–0.1)
Immature Granulocytes: 1 %
Lymphocytes Absolute: 3.4 10*3/uL — ABNORMAL HIGH (ref 0.7–3.1)
Lymphocytes Relative: 14 %
Lymphs Abs: 1.3 10*3/uL (ref 0.7–4.0)
Lymphs: 32 %
MCH: 24.9 pg — ABNORMAL LOW (ref 26.6–33.0)
MCH: 23.9 pg — ABNORMAL LOW (ref 26.0–34.0)
MCHC: 28.9 g/dL — ABNORMAL LOW (ref 31.5–35.7)
MCHC: 26.3 g/dL — ABNORMAL LOW (ref 30.0–36.0)
MCV: 86 fL (ref 79–97)
MCV: 90.9 fL (ref 80.0–100.0)
Monocytes Absolute: 1.1 10*3/uL — ABNORMAL HIGH (ref 0.1–0.9)
Monocytes Absolute: 0.7 10*3/uL (ref 0.1–1.0)
Monocytes Relative: 8 %
Monocytes: 11 %
Neutro Abs: 6.8 10*3/uL (ref 1.7–7.7)
Neutrophils Absolute: 5.7 10*3/uL (ref 1.4–7.0)
Neutrophils Relative %: 76 %
Neutrophils: 54 %
Platelets: 381 10*3/uL (ref 150–450)
Platelets: 330 10*3/uL (ref 150–400)
RBC: 2.73 x10E6/uL — CL (ref 4.14–5.80)
RBC: 2.64 MIL/uL — ABNORMAL LOW (ref 4.22–5.81)
RDW: 15.6 % — ABNORMAL HIGH (ref 11.6–15.4)
RDW: 16.8 % — ABNORMAL HIGH (ref 11.5–15.5)
WBC: 10.5 10*3/uL (ref 3.4–10.8)
WBC: 9 10*3/uL (ref 4.0–10.5)
nRBC: 0 % (ref 0.0–0.2)
nRBC: 0 /100 WBC

## 2018-09-25 LAB — PREPARE RBC (CROSSMATCH)

## 2018-09-25 LAB — BASIC METABOLIC PANEL
Anion gap: 8 (ref 5–15)
BUN: 12 mg/dL (ref 8–23)
CO2: 41 mmol/L — ABNORMAL HIGH (ref 22–32)
Calcium: 8.5 mg/dL — ABNORMAL LOW (ref 8.9–10.3)
Chloride: 95 mmol/L — ABNORMAL LOW (ref 98–111)
Creatinine, Ser: 0.77 mg/dL (ref 0.61–1.24)
GFR calc Af Amer: >60 mL/min (ref >60)
GFR calc non Af Amer: >60 mL/min (ref >60)
Glucose, Bld: 140 mg/dL — ABNORMAL HIGH (ref 70–99)
Potassium: 3.8 mmol/L (ref 3.5–5.1)
Sodium: 144 mmol/L (ref 135–145)

## 2018-09-25 LAB — IRON AND TIBC
Iron: 59 ug/dL (ref 45–182)
Saturation Ratios: 16 % — ABNORMAL LOW (ref 17.9–39.5)
TIBC: 375 ug/dL (ref 250–450)
UIBC: 316 ug/dL

## 2018-09-25 LAB — GLUCOSE, CAPILLARY: Glucose-Capillary: 100 mg/dL — ABNORMAL HIGH (ref 70–99)

## 2018-09-25 LAB — POC OCCULT BLOOD, ED: Fecal Occult Bld: POSITIVE — AB

## 2018-09-25 LAB — RETICULOCYTES
Immature Retic Fract: 30.1 % — ABNORMAL HIGH (ref 2.3–15.9)
RBC.: 2.64 MIL/uL — ABNORMAL LOW (ref 4.22–5.81)
Retic Count, Absolute: 142 10*3/uL (ref 19.0–186.0)
Retic Ct Pct: 5.6 % — ABNORMAL HIGH (ref 0.4–3.1)

## 2018-09-25 LAB — FERRITIN: Ferritin: 6 ng/mL — ABNORMAL LOW (ref 24–336)

## 2018-09-25 LAB — FOLATE: Folate: 14.7 ng/mL (ref >5.9)

## 2018-09-25 LAB — VITAMIN B12: Vitamin B-12: 602 pg/mL (ref 180–914)

## 2018-09-25 MED ORDER — SODIUM CHLORIDE 0.9 % IV BOLUS
500.0000 mL | Freq: Once | INTRAVENOUS | Status: AC
  Administered 2018-09-25: 500 mL via INTRAVENOUS

## 2018-09-25 MED ORDER — IPRATROPIUM-ALBUTEROL 0.5-2.5 (3) MG/3ML IN SOLN: 3.0000 mL | Freq: Four times a day (QID) | RESPIRATORY_TRACT | 3 refills | Status: DC | PRN

## 2018-09-25 MED ORDER — LORATADINE 10 MG PO TABS
10.0000 mg | ORAL_TABLET | Freq: Every day | ORAL | Status: DC
  Administered 2018-09-26 – 2018-09-28 (×3): 10 mg via ORAL
  Filled 2018-09-25 (×3): qty 1

## 2018-09-25 MED ORDER — POLYETHYLENE GLYCOL 3350 17 GM/SCOOP PO POWD
1.0000 | Freq: Once | ORAL | Status: AC
  Administered 2018-09-25: 1 via ORAL
  Filled 2018-09-25: qty 255

## 2018-09-25 MED ORDER — CARVEDILOL 3.125 MG PO TABS
3.1250 mg | ORAL_TABLET | Freq: Two times a day (BID) | ORAL | Status: DC
  Administered 2018-09-26 – 2018-09-28 (×5): 3.125 mg via ORAL
  Filled 2018-09-25 (×5): qty 1

## 2018-09-25 MED ORDER — POLYETHYLENE GLYCOL 3350 17 GM/SCOOP PO POWD: 1.0000 | Freq: Once | ORAL | Status: DC

## 2018-09-25 MED ORDER — ATORVASTATIN CALCIUM 80 MG PO TABS
80.0000 mg | ORAL_TABLET | Freq: Every day | ORAL | Status: DC
  Administered 2018-09-25 – 2018-09-28 (×4): 80 mg via ORAL
  Filled 2018-09-25 (×4): qty 1

## 2018-09-25 MED ORDER — ATORVASTATIN CALCIUM 80 MG PO TABS: 80.0000 mg | ORAL_TABLET | Freq: Every day | ORAL | Status: DC

## 2018-09-25 MED ORDER — FLUTICASONE FUROATE-VILANTEROL 100-25 MCG/INH IN AEPB
1.0000 | INHALATION_SPRAY | Freq: Every day | RESPIRATORY_TRACT | Status: DC
  Administered 2018-09-27: 1 via RESPIRATORY_TRACT
  Filled 2018-09-25: qty 28

## 2018-09-25 MED ORDER — GABAPENTIN 300 MG PO CAPS
300.0000 mg | ORAL_CAPSULE | Freq: Every day | ORAL | Status: DC
  Administered 2018-09-25 – 2018-09-27 (×3): 300 mg via ORAL
  Filled 2018-09-25 (×3): qty 1

## 2018-09-25 MED ORDER — SODIUM CHLORIDE 0.9 % IV SOLN
10.0000 mL/h | Freq: Once | INTRAVENOUS | Status: AC
  Administered 2018-09-25: 10 mL/h via INTRAVENOUS

## 2018-09-25 MED ORDER — IPRATROPIUM-ALBUTEROL 0.5-2.5 (3) MG/3ML IN SOLN
3.0000 mL | Freq: Four times a day (QID) | RESPIRATORY_TRACT | Status: DC | PRN
  Administered 2018-09-28: 09:00:00 3 mL via RESPIRATORY_TRACT

## 2018-09-25 MED ORDER — ALBUTEROL SULFATE HFA 108 (90 BASE) MCG/ACT IN AERS
2.0000 | INHALATION_SPRAY | Freq: Four times a day (QID) | RESPIRATORY_TRACT | Status: DC | PRN
  Filled 2018-09-25: qty 6.7

## 2018-09-25 MED ORDER — FUROSEMIDE 10 MG/ML IJ SOLN
40.0000 mg | Freq: Once | INTRAMUSCULAR | Status: AC
  Administered 2018-09-25: 40 mg via INTRAVENOUS
  Filled 2018-09-25: qty 4

## 2018-09-25 MED ORDER — FERROUS SULFATE 325 (65 FE) MG PO TABS
325.0000 mg | ORAL_TABLET | Freq: Every day | ORAL | Status: DC
  Administered 2018-09-26 – 2018-09-28 (×3): 325 mg via ORAL
  Filled 2018-09-25 (×3): qty 1

## 2018-09-25 MED ORDER — ACETAMINOPHEN 325 MG PO TABS
650.0000 mg | ORAL_TABLET | Freq: Four times a day (QID) | ORAL | Status: DC | PRN
  Administered 2018-09-25: 650 mg via ORAL
  Filled 2018-09-25: qty 2

## 2018-09-25 MED ORDER — FUROSEMIDE 40 MG PO TABS
40.0000 mg | ORAL_TABLET | Freq: Every day | ORAL | Status: DC
  Administered 2018-09-26 – 2018-09-28 (×3): 40 mg via ORAL
  Filled 2018-09-25 (×3): qty 1

## 2018-09-25 NOTE — Telephone Encounter (Signed)
Patients called .  Patient verified by name and date of birth.   Patient advised that HgB was 6.8, which was low, and that Dr Alvis Lemmings advised him to go to Emergency Room to be evaluated for possible GI bleed.  Patient acknowledged understanding of advise.

## 2018-09-25 NOTE — ED Provider Notes (Signed)
[facility] EMERGENCY DEPARTMENT Provider Note   CSN: [id number] Arrival date & time: 09/25/18  1159    History   Chief Complaint Chief Complaint  Patient presents with   Abnormal Lab    HPI Jared [name] is a 63 y.o. male with history of CAD, CHF, COPD on 3 L nasal cannula at all times, CVA, hypertension, tobacco abuse, iron deficiency anemia, nonischemic dilated cardiomyopathy, a flutter presents for evaluation of progressively worsening fatigue, dyspnea on exertion for 2 months.  Recently admitted for hemoglobin of 5 which required blood transfusion and EGD with no definitive source of bleeding.  Discharged for this on 09/07/2018.  Followed up with his GI doctor earlier in the week via telemedicine visit and his PCP yesterday via telemedicine visit.  Repeat labs drawn by his PCP show hemoglobin of 6.8.  He reports ongoing and progressively worsening generalized fatigue, dyspnea on exertion, lightheadedness with activity.  Reports that he has been increasing his home O2 to 4 L via nasal cannula due to his shortness of breath.  He denies fever, cough, chest pains, abdominal pain, nausea, or vomiting.  He reports that his stools are dark but he does not think that he has had any hematochezia or melena.  He remains anticoagulated on Xarelto.    The history is provided by the patient.    Past Medical History:  Diagnosis Date   CAD (coronary artery disease)    a. LHC 5/12:  LAD 20, pLCx 20, pRCA 40, dRCA 40, EF 35%, diff HK  //  b. Myoview 4/16: Overall Impression: High risk stress nuclear study There is no evidence of ischemia. There is severe LV dysfunction. LV Ejection Fraction: 30%. LV Wall Motion: There is global LV hypokinesis.    CAP (community acquired pneumonia) 09/2013   Chronic combined systolic and diastolic CHF (congestive heart failure) (HCC)    a. Echo 4/16:Mild LVH, EF 40-45%, diffuse HK //  b. Echo 8/17: EF 35-40%, diffuse HK, diastolic dysfunction,  aortic sclerosis, trivial MR, moderate LAE, normal RVSF, moderate RAE, mild TR, PASP 42 mmHg // c. Echo 4/18: Mild concentric LVH, EF 30-35, normal wall motion, grade 1 diastolic dysfunction, PASP 49   Cluster headache    "hx; haven't had one in awhile" (01/09/2016)   COPD (chronic obstructive pulmonary disease) (HCC)    [name] 01/09/2016   History of CVA (cerebrovascular accident)    Hypertension    NICM (nonischemic cardiomyopathy) (HCC)    Tobacco abuse     Patient Active Problem List   Diagnosis Date Noted   Iron deficiency anemia    Symptomatic anemia 09/04/2018   Tobacco use disorder 09/04/2018   Noncompliance 06/14/2017   Elevated LFTs 08/13/2016   Chronic respiratory failure with hypoxia (HCC) 07/24/2016   Atrial flutter (HCC)    Hepatic congestion 07/02/2016   Chronic combined systolic and diastolic CHF (congestive heart failure) (HCC) 07/01/2016   COPD with acute exacerbation (HCC) 06/03/2016   Prediabetes 05/14/2016   Hemoptysis 05/06/2016   COPD GOLD III/still smoking  02/29/2016   Cigarette smoker 01/09/2016   CAD (coronary artery disease) 10/07/2013   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   Centrilobular emphysema (HCC) 10/04/2013   Pulmonary infiltrate in right lung on CXR 10/03/2013   Acute on chronic respiratory failure with hypoxia (HCC) 10/03/2013   Essential hypertension     Past Surgical History:  Procedure Laterality Date   CARDIAC CATHETERIZATION  10/2010   LM normal, LAD with 20% irregularities, LCX with 20%,  RCA with 40% prox and 40% distal - EF of 35%   COLONOSCOPY W/ BIOPSIES AND POLYPECTOMY     COLONOSCOPY WITH PROPOFOL N/A 09/06/2018   Procedure: COLONOSCOPY WITH PROPOFOL;  Surgeon: Tressia Danas, MD;  Location: [facility] ENDOSCOPY;  Service: Gastroenterology;  Laterality: N/A;   ESOPHAGOGASTRODUODENOSCOPY (EGD) WITH PROPOFOL N/A 09/05/2018   Procedure: ESOPHAGOGASTRODUODENOSCOPY (EGD) WITH PROPOFOL;  Surgeon:  Benancio Deeds, MD;  Location: [facility] ENDOSCOPY;  Service: Gastroenterology;  Laterality: N/A;   EXCISION MASS HEAD     GIVENS CAPSULE STUDY N/A 09/06/2018   Procedure: GIVENS CAPSULE STUDY;  Surgeon: Tressia Danas, MD;  Location: [facility] ENDOSCOPY;  Service: Gastroenterology;  Laterality: N/A;   INCISION AND DRAINAGE PERIRECTAL ABSCESS N/A 06/05/2017   Procedure: IRRIGATION AND DEBRIDEMENT PERIRECTAL ABSCESS;  Surgeon: Andria Meuse, MD;  Location: [facility] OR;  Service: General;  Laterality: N/A;   VIDEO BRONCHOSCOPY Bilateral 05/08/2016   Procedure: VIDEO BRONCHOSCOPY WITH FLUORO;  Surgeon: Oretha Milch, MD;  Location: [facility] ENDOSCOPY;  Service: Cardiopulmonary;  Laterality: Bilateral;        Home Medications    Prior to Admission medications   Medication Sig Start Date End Date Taking? Authorizing Provider  albuterol (PROVENTIL HFA;VENTOLIN HFA) 108 (90 Base) MCG/ACT inhaler Inhale 2 puffs into the lungs every 6 (six) hours as needed for wheezing or shortness of breath. 05/15/18   [name], MD  atorvastatin (LIPITOR) 80 MG tablet Take 1 tablet (80 mg total) by mouth daily. 05/15/18   [name], MD  atorvastatin (LIPITOR) 80 MG tablet Take 1 tablet (80 mg total) by mouth daily. 09/06/18   Tressia Danas, MD  carvedilol (COREG) 3.125 MG tablet Take 1 tablet (3.125 mg total) by mouth 2 (two) times daily. 05/15/18 05/15/19  [name], MD  cetirizine (ZYRTEC) 10 MG tablet Take 1 tablet (10 mg total) by mouth daily. 06/18/18   [name], MD  ferrous sulfate 325 (65 FE) MG tablet Take 1 tablet (325 mg total) by mouth daily with breakfast. Patient not taking: Reported on 09/17/2018 09/09/18   Sherrilyn Rist, MD  fluticasone furoate-vilanterol (BREO ELLIPTA) 100-25 MCG/INH AEPB Inhale 1 puff into the lungs daily. 05/15/18   [name], MD  furosemide (LASIX) 40 MG tablet Take 1 tablet (40 mg total) by mouth daily. 05/16/18 05/16/19  [name], MD  gabapentin  (NEURONTIN) 300 MG capsule Take 1 capsule (300 mg total) by mouth at bedtime. 06/18/18   [name], MD  ipratropium-albuterol (DUONEB) 0.5-2.5 (3) MG/3ML SOLN Take 3 mLs by nebulization every 6 (six) hours as needed. 09/25/18   [name], MD  losartan (COZAAR) 50 MG tablet Take 1 tablet (50 mg total) by mouth daily. 05/15/18   [name], MD  OXYGEN Place 2 L into the nose daily. 2lpm when needed per pt    [provider]  pantoprazole (PROTONIX) 40 MG tablet Take 1 tablet (40 mg total) by mouth daily at 6 (six) AM. 09/07/18   Glade Lloyd, MD  rivaroxaban (XARELTO) 20 MG TABS tablet Take 20 mg by mouth daily with supper.    [provider]    Family History Family History  Problem Relation Age of Onset   Heart disease Mother    Diabetes Mother    Colon cancer Mother    Liver cancer Mother    Cancer Father        type unknown   Diabetes Sister        x 2   Diabetes Brother  Social History Social History   Tobacco Use   Smoking status: Current Every Day Smoker    Packs/day: 0.25    Years: 42.00    Pack years: 10.50    Types: Cigarettes   Smokeless tobacco: Never Used   Tobacco comment: 3 cigs daily  Substance Use Topics   Alcohol use: Yes    Alcohol/week: 0.0 standard drinks    Comment: last drink was before xmas   Drug use: No    Types: Cocaine, Marijuana    Comment: "nothing in 20 years"     Allergies   Lisinopril   Review of Systems Review of Systems  Constitutional: Positive for fatigue. Negative for chills and fever.  Respiratory: Positive for shortness of breath.   Cardiovascular: Negative for chest pain.  Gastrointestinal: Positive for blood in stool (?). Negative for abdominal pain, diarrhea, nausea and vomiting.  Neurological: Positive for light-headedness. Negative for syncope.  All other systems reviewed and are negative.    Physical Exam Updated Vital Signs BP 114/70 (BP Location: Right Arm)    Pulse  83    Temp 98.5 F (36.9 C) (Oral)    Resp 17    SpO2 100%   Physical Exam Vitals signs and nursing note reviewed.  Constitutional:      General: He is not in acute distress.    Appearance: He is well-developed.  HENT:     Head: Normocephalic and atraumatic.  Eyes:     General:        Right eye: No discharge.        Left eye: No discharge.     Conjunctiva/sclera: Conjunctivae normal.  Neck:     Musculoskeletal: Normal range of motion and neck supple.     Vascular: No JVD.     Trachea: No tracheal deviation.  Cardiovascular:     Rate and Rhythm: Normal rate and regular rhythm.  Pulmonary:     Effort: Pulmonary effort is normal.     Comments: Globally diminished breath sounds, scattered expiratory wheezes Abdominal:     General: Bowel sounds are normal. There is no distension.     Palpations: Abdomen is soft.     Tenderness: There is no abdominal tenderness. There is no guarding or rebound.  Genitourinary:    Comments: Examination performed in the presence of a chaperone.  No frank rectal bleeding.  Moderate amount of very dark stool in rectal vault.  No anal fissures or tears. Skin:    General: Skin is warm and dry.     Findings: No erythema.  Neurological:     Mental Status: He is alert.  Psychiatric:        Behavior: Behavior normal.      ED Treatments / Results  Labs (all labs ordered are listed, but only abnormal results are displayed) Labs Reviewed  BASIC METABOLIC PANEL - Abnormal; Notable for the following components:      Result Value   Chloride 95 (*)    CO2 41 (*)    Glucose, Bld 140 (*)    Calcium 8.5 (*)    All other components within normal limits  CBC WITH DIFFERENTIAL/PLATELET - Abnormal; Notable for the following components:   RBC 2.64 (*)    Hemoglobin 6.3 (*)    HCT 24.0 (*)    MCH 23.9 (*)    MCHC 26.3 (*)    RDW 16.8 (*)    All other components within normal limits  IRON AND TIBC - Abnormal; Notable for the  following components:    Saturation Ratios 16 (*)    All other components within normal limits  FERRITIN - Abnormal; Notable for the following components:   Ferritin 6 (*)    All other components within normal limits  RETICULOCYTES - Abnormal; Notable for the following components:   Retic Ct Pct 5.6 (*)    RBC. 2.64 (*)    Immature Retic Fract 30.1 (*)    All other components within normal limits  VITAMIN B12  FOLATE  POC OCCULT BLOOD, ED  TYPE AND SCREEN  PREPARE RBC (CROSSMATCH)    EKG None  Radiology Dg Chest Portable 1 View  Result Date: 09/25/2018 CLINICAL DATA:  Anemia.  No chest complaints. EXAM: PORTABLE CHEST 1 VIEW COMPARISON:  Chest x-ray dated September 04, 2018. FINDINGS: The heart size and mediastinal contours are within normal limits. Atherosclerotic calcification of the aortic arch. Normal pulmonary vascularity. The lungs remain hyperinflated with emphysematous changes mild and mild interstitial coarsening. No focal consolidation, pleural effusion, or pneumothorax. No acute osseous abnormality. IMPRESSION: 1.  No active cardiopulmonary disease. 2. COPD. Electronically Signed   By: Obie Dredge M.D.   On: 09/25/2018 13:19    Procedures .Critical Care Performed by: Jeanie Sewer, PA-C Authorized by: Jeanie Sewer, PA-C   Critical care provider statement:    Critical care time (minutes):  40   Critical care was necessary to treat or prevent imminent or life-threatening deterioration of the following conditions:  Circulatory failure   Critical care was time spent personally by me on the following activities:  Discussions with consultants, evaluation of patient's response to treatment, examination of patient, ordering and performing treatments and interventions, ordering and review of laboratory studies, ordering and review of radiographic studies, pulse oximetry, re-evaluation of patient's condition, obtaining history from patient or surrogate and review of old charts   I assumed direction of  critical care for this patient from another provider in my specialty: no     (including critical care time)  Medications Ordered in ED Medications  0.9 %  sodium chloride infusion (has no administration in time range)  sodium chloride 0.9 % bolus 500 mL (500 mLs Intravenous New Bag/Given 09/25/18 1254)     Initial Impression / Assessment and Plan / ED Course  I have reviewed the triage vital signs and the nursing notes.  Pertinent labs & imaging results that were available during my care of the patient were reviewed by me and considered in my medical decision making (see chart for details).        Patient presenting sent from PCP for evaluation of low hemoglobin.  He is afebrile, initially somewhat hypotensive with improvement while in the ED.  Nontoxic in appearance.  Endorses ongoing and worsening fatigue, dyspnea on exertion, lightheadedness.  Heme positive stools in the ED but no frank rectal bleeding.  He is anticoagulated on Xarelto.  Admission for similar presentation last month but no obvious source of GI bleeding on scope.  Hemoglobin of 6.3 today.  Remainder of lab work reviewed by me reassuring.  Anemia panel ordered.  Will transfuse 2 units packed red blood cells.  [facility] hospital[facility] service to admit (spoke with Dr.Gherghe), consulted  [facility] to follow patient while he is in the hospital.   Final Clinical Impressions(s) / ED Diagnoses   Final diagnoses:  Symptomatic anemia  Gastrointestinal hemorrhage, unspecified gastrointestinal hemorrhage type    ED Discharge Orders    None       [place], [place]  A, PA-C 09/25/18 1546    Gerhard Munch, MD 09/26/18 [phone]

## 2018-09-25 NOTE — Telephone Encounter (Signed)
Patient called and states that he needs a script for nebulizer solution and neb machine.   I will fill out paperwork for neb machine.Jared [name]

## 2018-09-25 NOTE — ED Notes (Signed)
ED TO INPATIENT HANDOFF REPORT  ED Nurse Name and Phone #:  Neldon Labella [phone]  S Name/Age/Gender Jared [name] 63 y.o. male Room/Bed: 036C/036C  Code Status   Code Status: Prior  Home/SNF/Other Home Patient oriented to: self, place, time and situation Is this baseline? Yes  Triage Complete: Triage complete  Chief Complaint low hgb  Triage Note Pt was here recently for anemia, had follow up at PCP today and was called back with low hemoglobin. Pt reports some increased fatigue and SOB. No distress noted.    Allergies Allergies  Allergen Reactions  . Lisinopril Cough    Level of Care/Admitting Diagnosis ED Disposition    ED Disposition Condition Comment   Admit  Hospital Area: [facility] [[id number]]  Level of Care: Telemetry Medical [104]  I expect the patient will be discharged within 24 hours: No (not a candidate for 5C-Observation unit)  Diagnosis: Symptomatic anemia [[id number]]  Admitting Physician: Leatha Gilding [phone]  Attending Physician: Leatha Gilding [[id number]]  PT Class (Do Not Modify): Observation [104]  PT Acc Code (Do Not Modify): Observation [10022]       B Medical/Surgery History Past Medical History:  Diagnosis Date  . CAD (coronary artery disease)    a. LHC 5/12:  LAD 20, pLCx 20, pRCA 40, dRCA 40, EF 35%, diff HK  //  b. Myoview 4/16: Overall Impression: High risk stress nuclear study There is no evidence of ischemia. There is severe LV dysfunction. LV Ejection Fraction: 30%. LV Wall Motion: There is global LV hypokinesis.   Marland Kitchen CAP (community acquired pneumonia) 09/2013  . Chronic combined systolic and diastolic CHF (congestive heart failure) (HCC)    a. Echo 4/16:Mild LVH, EF 40-45%, diffuse HK //  b. Echo 8/17: EF 35-40%, diffuse HK, diastolic dysfunction, aortic sclerosis, trivial MR, moderate LAE, normal RVSF, moderate RAE, mild TR, PASP 42 mmHg // c. Echo 4/18: Mild concentric LVH, EF 30-35, normal wall motion, grade 1  diastolic dysfunction, PASP 49  . Cluster headache    "hx; haven't had one in awhile" (01/09/2016)  . COPD (chronic obstructive pulmonary disease) (HCC)    [name] 01/09/2016  . History of CVA (cerebrovascular accident)   . Hypertension   . NICM (nonischemic cardiomyopathy) (HCC)   . Tobacco abuse    Past Surgical History:  Procedure Laterality Date  . CARDIAC CATHETERIZATION  10/2010   LM normal, LAD with 20% irregularities, LCX with 20%, RCA with 40% prox and 40% distal - EF of 35%  . COLONOSCOPY W/ BIOPSIES AND POLYPECTOMY    . COLONOSCOPY WITH PROPOFOL N/A 09/06/2018   Procedure: COLONOSCOPY WITH PROPOFOL;  Surgeon: Tressia Danas, MD;  Location[facility] ENDOSCOPY;  Service: Gastroenterology;  Laterality: N/A;  . ESOPHAGOGASTRODUODENOSCOPY (EGD) WITH PROPOFOL N/A 09/05/2018   Procedure: ESOPHAGOGASTRODUODENOSCOPY (EGD) WITH PROPOFOL;  Surgeon: Benancio Deeds, MD;  Location: [facility] ENDOSCOPY;  Service: Gastroenterology;  Laterality: N/A;  . EXCISION MASS HEAD    . GIVENS CAPSULE STUDY N/A 09/06/2018   Procedure: GIVENS CAPSULE STUDY;  Surgeon: Tressia Danas, MD;  Location: [facility] ENDOSCOPY;  Service: Gastroenterology;  Laterality: N/A;  . INCISION AND DRAINAGE PERIRECTAL ABSCESS N/A 06/05/2017   Procedure: IRRIGATION AND DEBRIDEMENT PERIRECTAL ABSCESS;  Surgeon: Andria Meuse, MD;  Location: [facility] OR;  Service: General;  Laterality: N/A;  . VIDEO BRONCHOSCOPY Bilateral 05/08/2016   Procedure: VIDEO BRONCHOSCOPY WITH FLUORO;  Surgeon: Oretha Milch, MD;  Location: [facility] ENDOSCOPY;  Service: Cardiopulmonary;  Laterality: Bilateral;     A  IV Location/Drains/Wounds Patient Lines/Drains/Airways Status   Active Line/Drains/Airways    Name:   Placement date:   Placement time:   Site:   Days:   Peripheral IV 09/25/18 Right Wrist   09/25/18    1233    Wrist   less than 1          Intake/Output Last 24 hours No intake or output data in the 24 hours ending 09/25/18  1601  Labs/Imaging Results for orders placed or performed during the hospital encounter of 09/25/18 (from the past 48 hour(s))  Basic metabolic panel     Status: Abnormal   Collection Time: 09/25/18 12:35 PM  Result Value Ref Range   Sodium 144 135 - 145 mmol/L   Potassium 3.8 3.5 - 5.1 mmol/L   Chloride 95 (L) 98 - 111 mmol/L   CO2 41 (H) 22 - 32 mmol/L   Glucose, Bld 140 (H) 70 - 99 mg/dL   BUN 12 8 - 23 mg/dL   Creatinine, Ser 1.610.77 0.61 - 1.24 mg/dL   Calcium 8.5 (L) 8.9 - 10.3 mg/dL   GFR calc non Af Amer >60 >60 mL/min   GFR calc Af Amer >60 >60 mL/min   Anion gap 8 5 - 15    Comment: Performed at [facility], [address] [place], [place] [address]  CBC with Differential     Status: Abnormal   Collection Time: 09/25/18 12:35 PM  Result Value Ref Range   WBC 9.0 4.0 - 10.5 K/uL   RBC 2.64 (L) 4.22 - 5.81 MIL/uL   Hemoglobin 6.3 (LL) 13.0 - 17.0 g/dL    Comment: REPEATED TO VERIFY CRITICAL RESULT CALLED TO, READ BACK BY AND VERIFIED WITH: H DOOLEY RN AT 1410 ON [id number] BY K FORSYTH    HCT 24.0 (L) 39.0 - 52.0 %   MCV 90.9 80.0 - 100.0 fL   MCH 23.9 (L) 26.0 - 34.0 pg   MCHC 26.3 (L) 30.0 - 36.0 g/dL   RDW 14.716.8 (H) 82.911.5 - 56.215.5 %   Platelets 330 150 - 400 K/uL   nRBC 0.0 0.0 - 0.2 %   Neutrophils Relative % 76 %   Neutro Abs 6.8 1.7 - 7.7 K/uL   Lymphocytes Relative 14 %   Lymphs Abs 1.3 0.7 - 4.0 K/uL   Monocytes Relative 8 %   Monocytes Absolute 0.7 0.1 - 1.0 K/uL   Eosinophils Relative 1 %   Eosinophils Absolute 0.1 0.0 - 0.5 K/uL   Basophils Relative 1 %   Basophils Absolute 0.1 0.0 - 0.1 K/uL   nRBC 0 0 /100 WBC   Abs Immature Granulocytes 0.00 0.00 - 0.07 K/uL   Polychromasia PRESENT    Ovalocytes PRESENT     Comment: Performed at [facility], [address] [place], [place] [address]  Vitamin B12     Status: None   Collection Time: 09/25/18 12:35 PM  Result Value Ref Range   Vitamin B-12 602 180 - 914 pg/mL    Comment: (NOTE) This  assay is not validated for testing neonatal or myeloproliferative syndrome specimens for Vitamin B12 levels. Performed at [facility], [address] [place], [place] [address]   Folate     Status: None   Collection Time: 09/25/18 12:35 PM  Result Value Ref Range   Folate 14.7 >5.9 ng/mL    Comment: Performed at [facility], [address] [place], [place] [address]  Iron and TIBC  Status: Abnormal   Collection Time: 09/25/18 12:35 PM  Result Value Ref Range   Iron 59 45 - 182 ug/dL   TIBC 563 893 - 734 ug/dL   Saturation Ratios 16 (L) 17.9 - 39.5 %   UIBC 316 ug/dL    Comment: Performed at [facility], [address] [place], [place] [address]  Ferritin     Status: Abnormal   Collection Time: 09/25/18 12:35 PM  Result Value Ref Range   Ferritin 6 (L) 24 - 336 ng/mL    Comment: Performed at [facility], [address] [place], [place] [address]  Reticulocytes     Status: Abnormal   Collection Time: 09/25/18 12:35 PM  Result Value Ref Range   Retic Ct Pct 5.6 (H) 0.4 - 3.1 %    Comment: RESULTS CONFIRMED BY MANUAL DILUTION   RBC. 2.64 (L) 4.22 - 5.81 MIL/uL   Retic Count, Absolute 142.0 19.0 - 186.0 K/uL   Immature Retic Fract 30.1 (H) 2.3 - 15.9 %    Comment: Performed at [facility], [address] [place], [place] [address]  Type and screen [facility]     Status: None (Preliminary result)   Collection Time: 09/25/18 12:35 PM  Result Value Ref Range   ABO/RH(D) O POS    Antibody Screen NEG    Sample Expiration      09/28/2018 Performed at [facility], [address], [place], [place] [address]    Unit Number [id number]    Blood Component Type RED CELLS,LR    Unit division 00    Status of Unit ALLOCATED    Transfusion Status OK TO TRANSFUSE    Crossmatch Result Compatible   Prepare RBC     Status: None   Collection Time: 09/25/18  3:12 PM  Result Value Ref Range   Order Confirmation      ORDER  PROCESSED BY BLOOD BANK Performed at [facility], [address] [place], [place] [address]    Dg Chest Portable 1 View  Result Date: 09/25/2018 CLINICAL DATA:  Anemia.  No chest complaints. EXAM: PORTABLE CHEST 1 VIEW COMPARISON:  Chest x-ray dated September 04, 2018. FINDINGS: The heart size and mediastinal contours are within normal limits. Atherosclerotic calcification of the aortic arch. Normal pulmonary vascularity. The lungs remain hyperinflated with emphysematous changes mild and mild interstitial coarsening. No focal consolidation, pleural effusion, or pneumothorax. No acute osseous abnormality. IMPRESSION: 1.  No active cardiopulmonary disease. 2. COPD. Electronically Signed   By: Obie Dredge M.D.   On: 09/25/2018 13:19    Pending Labs [employer] (From admission, onward)    Start     Ordered   Signed and Held  CBC  Tomorrow morning,   R     Signed and Held   Signed and Held  Basic metabolic panel  Tomorrow morning,   R     Signed and Held          Vitals/Pain Today's Vitals   09/25/18 1500 09/25/18 1530 09/25/18 1541 09/25/18 1546  BP: 126/79 114/70 114/70   Pulse: 93 81 83   Resp:   17   Temp:      TempSrc:      SpO2: 100% 100% 100%   PainSc:    0-No pain    Isolation Precautions No active isolations  Medications Medications  0.9 %  sodium chloride infusion (has no administration in time range)  sodium chloride 0.9 % bolus 500 mL (500 mLs Intravenous New Bag/Given 09/25/18 1254)    Mobility walks Low fall risk   Focused Assessments Neuro Assessment Handoff:  Swallow screen pass? not given         Neuro Assessment: Within Defined Limits Neuro Checks:      Last Documented NIHSS Modified Score:   Has TPA been given? No If patient is a Neuro Trauma and patient is going to OR before floor call report to 4N Charge nurse: [phone] or [phone]     R Recommendations: See Admitting Provider Note  Report given to:   Additional  Notes:  HgB 6.3

## 2018-09-25 NOTE — ED Triage Notes (Signed)
Pt was here recently for anemia, had follow up at PCP today and was called back with low hemoglobin. Pt reports some increased fatigue and SOB. No distress noted.

## 2018-09-25 NOTE — H&P (Signed)
History and Physical    Jared [name] GGE:[id number] DOB: Sep 20, 1955 DOA: 09/25/2018  I have briefly reviewed the patient's prior medical records in [facility]  PCP: [name], MD  Patient coming from: home  Chief Complaint: Generalized weakness, low hemoglobin on PCP evaluation  HPI: Jared [name] is a 63 y.o. male with medical history significant of coronary artery disease, chronic combined systolic and diastolic CHF, COPD on chronic oxygen at home 3 L, ongoing tobacco use, history of CVA, hypertension, presents to the hospital with chief complaint of generalized weakness.  Patient was hospitalized and discharged on 09/07/2018 with similar symptoms found to have acute blood loss anemia in the setting of GI bleed.  Gastroenterology was consulted at that time and patient underwent an EGD on 3/27 which showed mild esophagitis.  He also underwent a colonoscopy which was normal.  This was followed by capsule endoscopy which was not resulted but as an outpatient.  Since discharge patient has continued to feel weak, fatigued, presented to the PCP and he was found to have low hemoglobin and directed back again to the emergency room.  Currently patient denies any chest pain, does complain of shortness of breath but he is at baseline and is not different than his normal breathing.  He continues to be on oxygen.  He denies any abdominal pain, nausea or vomiting.  He does complain of black stools that have been persistent since discharge last time.  He denies any diarrhea.  ED Course: In the ED patient's vital signs are stable, he is afebrile, normotensive.  He satting well on chronic home oxygen.  His blood work reveals normal renal function with BUN of 12 and creatinine 0.7, hemoglobin was found to be decreased to 6.3.  During discharge few weeks ago he was 8.0.  Iron studies showed low ferritin.  Review of Systems: As per HPI otherwise 10 point review of systems negative.   Past Medical History:   Diagnosis Date  . CAD (coronary artery disease)    a. LHC 5/12:  LAD 20, pLCx 20, pRCA 40, dRCA 40, EF 35%, diff HK  //  b. Myoview 4/16: Overall Impression: High risk stress nuclear study There is no evidence of ischemia. There is severe LV dysfunction. LV Ejection Fraction: 30%. LV Wall Motion: There is global LV hypokinesis.   Marland Kitchen CAP (community acquired pneumonia) 09/2013  . Chronic combined systolic and diastolic CHF (congestive heart failure) (HCC)    a. Echo 4/16:Mild LVH, EF 40-45%, diffuse HK //  b. Echo 8/17: EF 35-40%, diffuse HK, diastolic dysfunction, aortic sclerosis, trivial MR, moderate LAE, normal RVSF, moderate RAE, mild TR, PASP 42 mmHg // c. Echo 4/18: Mild concentric LVH, EF 30-35, normal wall motion, grade 1 diastolic dysfunction, PASP 49  . Cluster headache    "hx; haven't had one in awhile" (01/09/2016)  . COPD (chronic obstructive pulmonary disease) (HCC)    [name] 01/09/2016  . History of CVA (cerebrovascular accident)   . Hypertension   . NICM (nonischemic cardiomyopathy) (HCC)   . Tobacco abuse     Past Surgical History:  Procedure Laterality Date  . CARDIAC CATHETERIZATION  10/2010   LM normal, LAD with 20% irregularities, LCX with 20%, RCA with 40% prox and 40% distal - EF of 35%  . COLONOSCOPY W/ BIOPSIES AND POLYPECTOMY    . COLONOSCOPY WITH PROPOFOL N/A 09/06/2018   Procedure: COLONOSCOPY WITH PROPOFOL;  Surgeon: Tressia Danas, MD;  Location: [facility] ENDOSCOPY;  Service: Gastroenterology;  Laterality: N/A;  .  ESOPHAGOGASTRODUODENOSCOPY (EGD) WITH PROPOFOL N/A 09/05/2018   Procedure: ESOPHAGOGASTRODUODENOSCOPY (EGD) WITH PROPOFOL;  Surgeon: Benancio Deeds, MD;  Location: [facility] ENDOSCOPY;  Service: Gastroenterology;  Laterality: N/A;  . EXCISION MASS HEAD    . GIVENS CAPSULE STUDY N/A 09/06/2018   Procedure: GIVENS CAPSULE STUDY;  Surgeon: Tressia Danas, MD;  Location: [facility] ENDOSCOPY;  Service: Gastroenterology;  Laterality: N/A;  . INCISION AND DRAINAGE  PERIRECTAL ABSCESS N/A 06/05/2017   Procedure: IRRIGATION AND DEBRIDEMENT PERIRECTAL ABSCESS;  Surgeon: Andria Meuse, MD;  Location: [facility] OR;  Service: General;  Laterality: N/A;  . VIDEO BRONCHOSCOPY Bilateral 05/08/2016   Procedure: VIDEO BRONCHOSCOPY WITH FLUORO;  Surgeon: Oretha Milch, MD;  Location: [facility] ENDOSCOPY;  Service: Cardiopulmonary;  Laterality: Bilateral;     reports that he has been smoking cigarettes. He has a 10.50 pack-year smoking history. He has never used smokeless tobacco. He reports current alcohol use. He reports that he does not use drugs.  Allergies  Allergen Reactions  . Lisinopril Cough    Family History  Problem Relation Age of Onset  . Heart disease Mother   . Diabetes Mother   . Colon cancer Mother   . Liver cancer Mother   . Cancer Father        type unknown  . Diabetes Sister        x 2  . Diabetes Brother     Prior to Admission medications   Medication Sig Start Date End Date Taking? Authorizing Provider  albuterol (PROVENTIL HFA;VENTOLIN HFA) 108 (90 Base) MCG/ACT inhaler Inhale 2 puffs into the lungs every 6 (six) hours as needed for wheezing or shortness of breath. 05/15/18   [name], MD  atorvastatin (LIPITOR) 80 MG tablet Take 1 tablet (80 mg total) by mouth daily. 05/15/18   [name], MD  atorvastatin (LIPITOR) 80 MG tablet Take 1 tablet (80 mg total) by mouth daily. 09/06/18   Tressia Danas, MD  carvedilol (COREG) 3.125 MG tablet Take 1 tablet (3.125 mg total) by mouth 2 (two) times daily. 05/15/18 05/15/19  [name], MD  cetirizine (ZYRTEC) 10 MG tablet Take 1 tablet (10 mg total) by mouth daily. 06/18/18   [name], MD  ferrous sulfate 325 (65 FE) MG tablet Take 1 tablet (325 mg total) by mouth daily with breakfast. Patient not taking: Reported on 09/17/2018 09/09/18   Sherrilyn Rist, MD  fluticasone furoate-vilanterol (BREO ELLIPTA) 100-25 MCG/INH AEPB Inhale 1 puff into the lungs daily. 05/15/18   [name], MD  furosemide (LASIX) 40 MG tablet Take 1 tablet (40 mg total) by mouth daily. 05/16/18 05/16/19  [name], MD  gabapentin (NEURONTIN) 300 MG capsule Take 1 capsule (300 mg total) by mouth at bedtime. 06/18/18   [name], MD  ipratropium-albuterol (DUONEB) 0.5-2.5 (3) MG/3ML SOLN Take 3 mLs by nebulization every 6 (six) hours as needed. 09/25/18   [name], MD  losartan (COZAAR) 50 MG tablet Take 1 tablet (50 mg total) by mouth daily. 05/15/18   [name], MD  OXYGEN Place 2 L into the nose daily. 2lpm when needed per pt    [provider]  pantoprazole (PROTONIX) 40 MG tablet Take 1 tablet (40 mg total) by mouth daily at 6 (six) AM. 09/07/18   Glade Lloyd, MD  rivaroxaban (XARELTO) 20 MG TABS tablet Take 20 mg by mouth daily with supper.    [provider]    Physical Exam: Vitals:   09/25/18 1205 09/25/18 1400  BP: Marland Kitchen)  99/57 117/69  Pulse: 96 85  Resp: 20   Temp: 98.5 F (36.9 C)   TempSrc: Oral   SpO2: 99% 100%      Constitutional: NAD, calm, comfortable Eyes: PERRL, lids and conjunctivae normal ENMT: Mucous membranes are moist. Posterior pharynx clear of any exudate or lesions.Normal dentition.  Neck: normal, supple, no masses, no thyromegaly Respiratory: faint end expiratory wheezing, no crackles heard.  Normal respiratory effort without accessory muscle use Cardiovascular: Regular rate and rhythm, no murmurs / rubs / gallops. No extremity edema. 2+ pedal pulses.  Abdomen: no tenderness, no masses palpated. Bowel sounds positive.  Musculoskeletal: no clubbing / cyanosis. Normal muscle tone.  Skin: no rashes, lesions, ulcers. No induration Neurologic: CN 2-12 grossly intact. Strength 5/5 in all 4.  Psychiatric: Normal judgment and insight. Alert and oriented x 3. Normal mood.   Labs on Admission: I have personally reviewed following labs and imaging studies  CBC: Recent Labs  Lab 09/24/18 1414 09/25/18 1235  WBC 10.5  9.0  NEUTROABS 5.7 6.8  HGB 6.8* 6.3*  HCT 23.5* 24.0*  MCV 86 90.9  PLT 381 330   Basic Metabolic Panel: Recent Labs  Lab 09/25/18 1235  NA 144  K 3.8  CL 95*  CO2 41*  GLUCOSE 140*  BUN 12  CREATININE 0.77  CALCIUM 8.5*   GFR: CrCl cannot be calculated (Unknown ideal weight.). Liver Function Tests: No results for input(s): AST, ALT, ALKPHOS, BILITOT, PROT, ALBUMIN in the last 168 hours. No results for input(s): LIPASE, AMYLASE in the last 168 hours. No results for input(s): AMMONIA in the last 168 hours. Coagulation Profile: No results for input(s): INR, PROTIME in the last 168 hours. Cardiac Enzymes: No results for input(s): CKTOTAL, CKMB, CKMBINDEX, TROPONINI in the last 168 hours. BNP (last 3 results) No results for input(s): PROBNP in the last 8760 hours. HbA1C: No results for input(s): HGBA1C in the last 72 hours. CBG: No results for input(s): GLUCAP in the last 168 hours. Lipid Profile: No results for input(s): CHOL, HDL, LDLCALC, TRIG, CHOLHDL, LDLDIRECT in the last 72 hours. Thyroid Function Tests: No results for input(s): TSH, T4TOTAL, FREET4, T3FREE, THYROIDAB in the last 72 hours. Anemia Panel: Recent Labs    09/25/18 1235  VITAMINB12 602  FOLATE 14.7  FERRITIN 6*  TIBC 375  IRON 59  RETICCTPCT 5.6*   Urine analysis:    Component Value Date/Time   COLORURINE YELLOW 07/25/2016 1845   APPEARANCEUR HAZY (A) 07/25/2016 1845   LABSPEC 1.017 07/25/2016 1845   PHURINE 5.0 07/25/2016 1845   GLUCOSEU 50 (A) 07/25/2016 1845   HGBUR SMALL (A) 07/25/2016 1845   BILIRUBINUR NEGATIVE 07/25/2016 1845   KETONESUR NEGATIVE 07/25/2016 1845   PROTEINUR 100 (A) 07/25/2016 1845   NITRITE NEGATIVE 07/25/2016 1845   LEUKOCYTESUR NEGATIVE 07/25/2016 1845     Radiological Exams on Admission: Dg Chest Portable 1 View  Result Date: 09/25/2018 CLINICAL DATA:  Anemia.  No chest complaints. EXAM: PORTABLE CHEST 1 VIEW COMPARISON:  Chest x-ray dated September 04, 2018. FINDINGS: The heart size and mediastinal contours are within normal limits. Atherosclerotic calcification of the aortic arch. Normal pulmonary vascularity. The lungs remain hyperinflated with emphysematous changes mild and mild interstitial coarsening. No focal consolidation, pleural effusion, or pneumothorax. No acute osseous abnormality. IMPRESSION: 1.  No active cardiopulmonary disease. 2. COPD. Electronically Signed   By: Obie Dredge M.D.   On: 09/25/2018 13:19    EKG: Independently reviewed.  Telemetry in the room showed  sinus rhythm  Assessment/Plan Active Problems:   Symptomatic anemia   Principal Problem Acute blood loss anemia/symptomatic anemia in the setting of concern for GI bleed -Patient with a history of the same with recent extensive evaluation by gastroenterology.  He continues to have black stools at home, presents to the hospital with hemoglobin of 6.3.  Gastroenterology consulted, appreciate input -make patient n.p.o. after midnight in case they plan procedures -Discussed with gastroenterology, apparently plans are for repeat capsule study  Active Problems COPD with chronic hypoxic respiratory failure -Patient with minimally wheezing on exam, tells me this is chronic for him and his breathing is at baseline.  Continue home medications.  He was strongly advised to quit smoking  Chronic combined systolic and diastolic CHF -Most recent 2D echo was done at the end of 2019 showed an EF of 45-50%, apparently improved from 35% prior.  Currently patient appears euvolemic, will receive blood transfusion and will resume home Lasix   Paroxysmal a flutter -Currently appears rate controlled, Xarelto is currently on hold pending GI evaluation and active GI bleed  Coronary artery disease /hyperlipidemia -No cardiac symptoms, continue Coreg, continue statin  Prediabetes -Most recent A1c 6.3 however that was in 2017 monitor CBGs with morning blood work and add sliding scale  if needed   DVT prophylaxis: SCDs  Code Status: Full code  Family Communication: no family at bedside  Disposition Plan: home when ready  Consults called: GI    Pamella Pertostin Gherghe, MD, PhD [facility]  Contact via [email or web address]  [facility] Office Info P: [phone]  F: [phone]   09/25/2018, 3:35 PM

## 2018-09-25 NOTE — Consult Note (Signed)
Referring Provider: [facility]   Primary Care Physician:  [name], MD Primary Gastroenterologist:  Amada Jupiter, MD Reason for Consultation:   Recurrent anemia and heme positive stool     ASSESSMENT / PLAN:     91.  63 year old male with recurrent anemia, FOBT + stool on Xarelto.  EGD, colonoscopy and small bowel capsule negative on recent admission .  -Repeat small bowel video capsule study.  Although the bowel prep was good, there was rapid transit of the camera through the small bowel  2. Aflutter on Xarelto. He takes Xarelto at night. We would for Xarelto to be continued right now.   3. Chronic liver disease, followed by Dr. Myrtie Neither. F2/F3 on elastrography  4. Family history colon cancer in mother   HPI:    Jared [name] is a 63 y.o. male with CAD, Aflutter on Xarelto, hx of CVA with F2/F3 changes of liver.  He was hospitalized here late March 2020 with symptomatic IDA and FOBT positive stool on Xarelto /Goody powders. EGD / colonoscopy and VCE all negative. On daily PPI. Patient called by Dr. Alvis Lemmings today and advised to return to ED for hgb in the 6 range. Patient hasn't had any black stools (even on iron). No rectal bleeding. He is not taking NSAIDS. Other than minor SOB he feels okay  ED Evaluation:  VSS Hemoglobin 6.8, it was 8 on 09/07/2018 Ferritin 6, TIBC 375, 16% saturation Normal renal function  Past Medical History:  Diagnosis Date  . CAD (coronary artery disease)    a. LHC 5/12:  LAD 20, pLCx 20, pRCA 40, dRCA 40, EF 35%, diff HK  //  b. Myoview 4/16: Overall Impression: High risk stress nuclear study There is no evidence of ischemia. There is severe LV dysfunction. LV Ejection Fraction: 30%. LV Wall Motion: There is global LV hypokinesis.   Marland Kitchen CAP (community acquired pneumonia) 09/2013  . Chronic combined systolic and diastolic CHF (congestive heart failure) (HCC)    a. Echo 4/16:Mild LVH, EF 40-45%, diffuse HK //  b. Echo 8/17: EF 35-40%, diffuse  HK, diastolic dysfunction, aortic sclerosis, trivial MR, moderate LAE, normal RVSF, moderate RAE, mild TR, PASP 42 mmHg // c. Echo 4/18: Mild concentric LVH, EF 30-35, normal wall motion, grade 1 diastolic dysfunction, PASP 49  . Cluster headache    "hx; haven't had one in awhile" (01/09/2016)  . COPD (chronic obstructive pulmonary disease) (HCC)    [name] 01/09/2016  . History of CVA (cerebrovascular accident)   . Hypertension   . NICM (nonischemic cardiomyopathy) (HCC)   . Tobacco abuse     Past Surgical History:  Procedure Laterality Date  . CARDIAC CATHETERIZATION  10/2010   LM normal, LAD with 20% irregularities, LCX with 20%, RCA with 40% prox and 40% distal - EF of 35%  . COLONOSCOPY W/ BIOPSIES AND POLYPECTOMY    . COLONOSCOPY WITH PROPOFOL N/A 09/06/2018   Procedure: COLONOSCOPY WITH PROPOFOL;  Surgeon: Tressia Danas, MD;  Location: [facility] ENDOSCOPY;  Service: Gastroenterology;  Laterality: N/A;  . ESOPHAGOGASTRODUODENOSCOPY (EGD) WITH PROPOFOL N/A 09/05/2018   Procedure: ESOPHAGOGASTRODUODENOSCOPY (EGD) WITH PROPOFOL;  Surgeon: Benancio Deeds, MD;  Location: [facility] ENDOSCOPY;  Service: Gastroenterology;  Laterality: N/A;  . EXCISION MASS HEAD    . GIVENS CAPSULE STUDY N/A 09/06/2018   Procedure: GIVENS CAPSULE STUDY;  Surgeon: Tressia Danas, MD;  Location: [facility] ENDOSCOPY;  Service: Gastroenterology;  Laterality: N/A;  . INCISION AND DRAINAGE PERIRECTAL ABSCESS N/A 06/05/2017   Procedure: IRRIGATION AND DEBRIDEMENT  PERIRECTAL ABSCESS;  Surgeon: Andria MeuseWhite, Christopher M, MD;  [facility];  Service: General;  Laterality: N/A;  . VIDEO BRONCHOSCOPY Bilateral 05/08/2016   Procedure: VIDEO BRONCHOSCOPY WITH FLUORO;  Surgeon: Oretha Milchakesh V Alva, MD;  [facility];  Service: Cardiopulmonary;  Laterality: Bilateral;    Prior to Admission medications   Medication Sig Start Date End Date Taking? Authorizing Provider  albuterol (PROVENTIL HFA;VENTOLIN HFA) 108 (90 Base) MCG/ACT  inhaler Inhale 2 puffs into the lungs every 6 (six) hours as needed for wheezing or shortness of breath. 05/15/18   Hoy RegisterNewlin, Enobong, MD  atorvastatin (LIPITOR) 80 MG tablet Take 1 tablet (80 mg total) by mouth daily. 05/15/18   Hoy RegisterNewlin, Enobong, MD  atorvastatin (LIPITOR) 80 MG tablet Take 1 tablet (80 mg total) by mouth daily. 09/06/18   Tressia DanasBeavers, Kimberly, MD  carvedilol (COREG) 3.125 MG tablet Take 1 tablet (3.125 mg total) by mouth 2 (two) times daily. 05/15/18 05/15/19  Hoy RegisterNewlin, Enobong, MD  cetirizine (ZYRTEC) 10 MG tablet Take 1 tablet (10 mg total) by mouth daily. 06/18/18   Hoy RegisterNewlin, Enobong, MD  ferrous sulfate 325 (65 FE) MG tablet Take 1 tablet (325 mg total) by mouth daily with breakfast. Patient not taking: Reported on 09/17/2018 09/09/18   Sherrilyn Ristanis, Henry L III, MD  fluticasone furoate-vilanterol (BREO ELLIPTA) 100-25 MCG/INH AEPB Inhale 1 puff into the lungs daily. 05/15/18   Hoy RegisterNewlin, Enobong, MD  furosemide (LASIX) 40 MG tablet Take 1 tablet (40 mg total) by mouth daily. 05/16/18 05/16/19  Hoy RegisterNewlin, Enobong, MD  gabapentin (NEURONTIN) 300 MG capsule Take 1 capsule (300 mg total) by mouth at bedtime. 06/18/18   Hoy RegisterNewlin, Enobong, MD  ipratropium-albuterol (DUONEB) 0.5-2.5 (3) MG/3ML SOLN Take 3 mLs by nebulization every 6 (six) hours as needed. 09/25/18   Hoy RegisterNewlin, Enobong, MD  losartan (COZAAR) 50 MG tablet Take 1 tablet (50 mg total) by mouth daily. 05/15/18   Hoy RegisterNewlin, Enobong, MD  OXYGEN Place 2 L into the nose daily. 2lpm when needed per pt    [provider]  pantoprazole (PROTONIX) 40 MG tablet Take 1 tablet (40 mg total) by mouth daily at 6 (six) AM. 09/07/18   Glade LloydAlekh, Kshitiz, MD  rivaroxaban (XARELTO) 20 MG TABS tablet Take 20 mg by mouth daily with supper.    [provider]    Current Facility-Administered Medications  Medication Dose Route Frequency Provider Last Rate Last Dose  . 0.9 %  sodium chloride infusion  10 mL/hr Intravenous Once Fawze, Mina A, PA-C        Allergies as of  09/25/2018 - Review Complete 09/25/2018  Allergen Reaction Noted  . Lisinopril Cough 06/29/2014    Family History  Problem Relation Age of Onset  . Heart disease Mother   . Diabetes Mother   . Colon cancer Mother   . Liver cancer Mother   . Cancer Father        type unknown  . Diabetes Sister        x 2  . Diabetes Brother     Social History   Socioeconomic History  . Marital status: Single    Spouse name: Not on file  . Number of children: 1  . Years of education: Not on file  . Highest education level: Not on file  Occupational History  . Occupation: retired  Engineer, productionocial Needs  . Financial resource strain: Not on file  . Food insecurity:    Worry: Not on file    Inability: Not on file  .  Transportation needs:    Medical: Not on file    Non-medical: Not on file  Tobacco Use  . Smoking status: Current Every Day Smoker    Packs/day: 0.25    Years: 42.00    Pack years: 10.50    Types: Cigarettes  . Smokeless tobacco: Never Used  . Tobacco comment: 3 cigs daily  Substance and Sexual Activity  . Alcohol use: Yes    Alcohol/week: 0.0 standard drinks    Comment: last drink was before xmas  . Drug use: No    Types: Cocaine, Marijuana    Comment: "nothing in 20 years"  . Sexual activity: Not Currently  Lifestyle  . Physical activity:    Days per week: Not on file    Minutes per session: Not on file  . Stress: Not on file  Relationships  . Social connections:    Talks on phone: Not on file    Gets together: Not on file    Attends religious service: Not on file    Active member of club or organization: Not on file    Attends meetings of clubs or organizations: Not on file    Relationship status: Not on file  . Intimate partner violence:    Fear of current or ex partner: Not on file    Emotionally abused: Not on file    Physically abused: Not on file    Forced sexual activity: Not on file  Other Topics Concern  . Not on file  Social History Narrative    unemployed    Review of Systems: All systems reviewed and negative except where noted in HPI.  Physical Exam: Vital signs in last 24 hours: Temp:  [98.5 F (36.9 C)] 98.5 F (36.9 C) (04/16 1205) Pulse Rate:  [81-96] 83 (04/16 1541) Resp:  [17-20] 17 (04/16 1541) BP: (99-126)/(57-79) 114/70 (04/16 1541) SpO2:  [99 %-100 %] 100 % (04/16 1541)   General:   Alert, well-developed,  male in NAD Psych:  Pleasant, cooperative. Normal mood and affect. Eyes:  Pupils equal, sclera clear, no icterus.   Conjunctiva pink. Ears:  Normal auditory acuity. Nose:  No deformity, discharge,  or lesions. Neck:  Supple; no masses Lungs:  Clear throughout to auscultation.   No wheezes, crackles, or rhonchi.  Heart:  Regular rate and rhythm; no murmurs, no lower extremity edema Abdomen:  Soft, non-distended, nontender, BS active, no palp mass    Rectal:  Deferred  Msk:  Symmetrical without gross deformities. . Neurologic:  Alert and  oriented x4;  grossly normal neurologically. Skin:  Intact without significant lesions or rashes.   Intake/Output from previous day: No intake/output data recorded. Intake/Output this shift: No intake/output data recorded.  Lab Results: Recent Labs    09/24/18 1414 09/25/18 1235  WBC 10.5 9.0  HGB 6.8* 6.3*  HCT 23.5* 24.0*  PLT 381 330   BMET Recent Labs    09/25/18 1235  NA 144  K 3.8  CL 95*  CO2 41*  GLUCOSE 140*  BUN 12  CREATININE 0.77  CALCIUM 8.5*     Studies/Results: Dg Chest Portable 1 View  Result Date: 09/25/2018 CLINICAL DATA:  Anemia.  No chest complaints. EXAM: PORTABLE CHEST 1 VIEW COMPARISON:  Chest x-ray dated September 04, 2018. FINDINGS: The heart size and mediastinal contours are within normal limits. Atherosclerotic calcification of the aortic arch. Normal pulmonary vascularity. The lungs remain hyperinflated with emphysematous changes mild and mild interstitial coarsening. No focal consolidation, pleural effusion, or  pneumothorax. No acute osseous abnormality. IMPRESSION: 1.  No active cardiopulmonary disease. 2. COPD. Electronically Signed   By: Obie Dredge M.D.   On: 09/25/2018 13:19     Willette Cluster, NP-C @  09/25/2018, 4:27 PM

## 2018-09-25 NOTE — Telephone Encounter (Signed)
Rx for nebulizer solution sent to pharmacy. Thanks.

## 2018-09-26 ENCOUNTER — Encounter (HOSPITAL_COMMUNITY)
Admission: EM | Disposition: A | Discharge status: Home / Self Care | Payer: Self-pay | Source: Home / Self Care | Attending: Internal Medicine

## 2018-09-26 DIAGNOSIS — K769 Liver disease, unspecified: Secondary | ICD-10-CM | POA: Diagnosis present

## 2018-09-26 DIAGNOSIS — I2510 Atherosclerotic heart disease of native coronary artery without angina pectoris: Secondary | ICD-10-CM | POA: Diagnosis present

## 2018-09-26 DIAGNOSIS — R251 Tremor, unspecified: Secondary | ICD-10-CM | POA: Diagnosis not present

## 2018-09-26 DIAGNOSIS — R7303 Prediabetes: Secondary | ICD-10-CM

## 2018-09-26 DIAGNOSIS — K31819 Angiodysplasia of stomach and duodenum without bleeding: Secondary | ICD-10-CM

## 2018-09-26 DIAGNOSIS — Z79899 Other long term (current) drug therapy: Secondary | ICD-10-CM | POA: Diagnosis not present

## 2018-09-26 DIAGNOSIS — K5520 Angiodysplasia of colon without hemorrhage: Secondary | ICD-10-CM

## 2018-09-26 DIAGNOSIS — Z9981 Dependence on supplemental oxygen: Secondary | ICD-10-CM | POA: Diagnosis not present

## 2018-09-26 DIAGNOSIS — Z800 Family history of malignant neoplasm of digestive organs: Secondary | ICD-10-CM | POA: Diagnosis not present

## 2018-09-26 DIAGNOSIS — K922 Gastrointestinal hemorrhage, unspecified: Secondary | ICD-10-CM

## 2018-09-26 DIAGNOSIS — K5521 Angiodysplasia of colon with hemorrhage: Secondary | ICD-10-CM | POA: Diagnosis present

## 2018-09-26 DIAGNOSIS — Z8249 Family history of ischemic heart disease and other diseases of the circulatory system: Secondary | ICD-10-CM | POA: Diagnosis not present

## 2018-09-26 DIAGNOSIS — R195 Other fecal abnormalities: Secondary | ICD-10-CM | POA: Diagnosis not present

## 2018-09-26 DIAGNOSIS — Z716 Tobacco abuse counseling: Secondary | ICD-10-CM | POA: Diagnosis not present

## 2018-09-26 DIAGNOSIS — D649 Anemia, unspecified: Secondary | ICD-10-CM

## 2018-09-26 DIAGNOSIS — I5042 Chronic combined systolic (congestive) and diastolic (congestive) heart failure: Secondary | ICD-10-CM | POA: Diagnosis present

## 2018-09-26 DIAGNOSIS — Z7901 Long term (current) use of anticoagulants: Secondary | ICD-10-CM | POA: Diagnosis not present

## 2018-09-26 DIAGNOSIS — Z8673 Personal history of transient ischemic attack (TIA), and cerebral infarction without residual deficits: Secondary | ICD-10-CM | POA: Diagnosis not present

## 2018-09-26 DIAGNOSIS — J9611 Chronic respiratory failure with hypoxia: Secondary | ICD-10-CM

## 2018-09-26 DIAGNOSIS — I420 Dilated cardiomyopathy: Secondary | ICD-10-CM | POA: Diagnosis present

## 2018-09-26 DIAGNOSIS — Z7951 Long term (current) use of inhaled steroids: Secondary | ICD-10-CM | POA: Diagnosis not present

## 2018-09-26 DIAGNOSIS — D62 Acute posthemorrhagic anemia: Secondary | ICD-10-CM | POA: Diagnosis present

## 2018-09-26 DIAGNOSIS — Z888 Allergy status to other drugs, medicaments and biological substances status: Secondary | ICD-10-CM | POA: Diagnosis not present

## 2018-09-26 DIAGNOSIS — I110 Hypertensive heart disease with heart failure: Secondary | ICD-10-CM | POA: Diagnosis present

## 2018-09-26 DIAGNOSIS — F17210 Nicotine dependence, cigarettes, uncomplicated: Secondary | ICD-10-CM | POA: Diagnosis present

## 2018-09-26 DIAGNOSIS — E785 Hyperlipidemia, unspecified: Secondary | ICD-10-CM | POA: Diagnosis present

## 2018-09-26 DIAGNOSIS — J449 Chronic obstructive pulmonary disease, unspecified: Secondary | ICD-10-CM | POA: Diagnosis present

## 2018-09-26 DIAGNOSIS — I4892 Unspecified atrial flutter: Secondary | ICD-10-CM

## 2018-09-26 HISTORY — PX: GIVENS CAPSULE STUDY: SHX5432

## 2018-09-26 LAB — CBC
HCT: 25 % — ABNORMAL LOW (ref 39.0–52.0)
Hemoglobin: 6.9 g/dL — CL (ref 13.0–17.0)
MCH: 24.8 pg — ABNORMAL LOW (ref 26.0–34.0)
MCHC: 27.6 g/dL — ABNORMAL LOW (ref 30.0–36.0)
MCV: 89.9 fL (ref 80.0–100.0)
Platelets: 288 10*3/uL (ref 150–400)
RBC: 2.78 MIL/uL — ABNORMAL LOW (ref 4.22–5.81)
RDW: 16.3 % — ABNORMAL HIGH (ref 11.5–15.5)
WBC: 8.4 10*3/uL (ref 4.0–10.5)
nRBC: 0.2 % (ref 0.0–0.2)

## 2018-09-26 LAB — BASIC METABOLIC PANEL
BUN: 8 mg/dL (ref 8–23)
CO2: >50 mmol/L — ABNORMAL HIGH (ref 22–32)
Calcium: 8.8 mg/dL — ABNORMAL LOW (ref 8.9–10.3)
Chloride: 89 mmol/L — ABNORMAL LOW (ref 98–111)
Creatinine, Ser: 0.79 mg/dL (ref 0.61–1.24)
GFR calc Af Amer: >60 mL/min (ref >60)
GFR calc non Af Amer: >60 mL/min (ref >60)
Glucose, Bld: 89 mg/dL (ref 70–99)
Potassium: 3.5 mmol/L (ref 3.5–5.1)
Sodium: 143 mmol/L (ref 135–145)

## 2018-09-26 LAB — HEMOGLOBIN AND HEMATOCRIT, BLOOD
HCT: 25.8 % — ABNORMAL LOW (ref 39.0–52.0)
HCT: 28.7 % — ABNORMAL LOW (ref 39.0–52.0)
Hemoglobin: 7.2 g/dL — ABNORMAL LOW (ref 13.0–17.0)
Hemoglobin: 8.3 g/dL — ABNORMAL LOW (ref 13.0–17.0)

## 2018-09-26 SURGERY — IMAGING PROCEDURE, GI TRACT, INTRALUMINAL, VIA CAPSULE
Anesthesia: LOCAL

## 2018-09-26 MED ORDER — RIVAROXABAN 20 MG PO TABS: 20.0000 mg | ORAL_TABLET | Freq: Every day | ORAL | Status: DC

## 2018-09-26 MED ORDER — SODIUM CHLORIDE 0.9 % IV SOLN
INTRAVENOUS | Status: DC | PRN
  Administered 2018-09-26 (×2): 250 mL via INTRAVENOUS

## 2018-09-26 MED ORDER — POTASSIUM CHLORIDE 10 MEQ/100ML IV SOLN
10.0000 meq | INTRAVENOUS | Status: AC
  Administered 2018-09-26: 14:00:00 10 meq via INTRAVENOUS
  Filled 2018-09-26: qty 100

## 2018-09-26 MED ORDER — POTASSIUM CHLORIDE 10 MEQ/100ML IV SOLN: 10.0000 meq | INTRAVENOUS | Status: DC

## 2018-09-26 MED ORDER — RIVAROXABAN 20 MG PO TABS
20.0000 mg | ORAL_TABLET | Freq: Every day | ORAL | Status: DC
  Administered 2018-09-26 – 2018-09-28 (×3): 20 mg via ORAL
  Filled 2018-09-26 (×3): qty 1

## 2018-09-26 MED ORDER — POTASSIUM CHLORIDE 10 MEQ/100ML IV SOLN
10.0000 meq | Freq: Once | INTRAVENOUS | Status: AC
  Administered 2018-09-26: 10 meq via INTRAVENOUS
  Filled 2018-09-26: qty 100

## 2018-09-26 SURGICAL SUPPLY — 1 items: TOWEL COTTON PACK 4EA (MISCELLANEOUS) ×4 IMPLANT

## 2018-09-26 NOTE — Progress Notes (Signed)
miralax prep started at 10pm due to oversight of RN.  Patient completed half of bottle prior to midnight.

## 2018-09-26 NOTE — Progress Notes (Signed)
     Progress Note    ASSESSMENT AND PLAN:   81. 63 year old male with recurrent anemia, FOBT + stool on Xarelto.  EGD, colonoscopy and small bowel capsule negative on recent admission .  -Repeating small bowel video capsule today as there was rapid transit of the camera through the small bowel on the last study -Got a uPRBC last evening, hgb up nearly a gram to 7.2 but down again today at 6.9.   2. Aflutter on Xarelto. He takes Xarelto at night. We would for Xarelto to be continued right now.   3. Chronic liver disease, followed by Dr. Myrtie Neither. F2/F3 on elastrography  4. Intermittent generalized tremors throughout the day. Episodes precede hospitalization. No associated extremity weakness. Not related to fevers.    SUBJECTIVE   Hungry. Feels okay, getting blood.    OBJECTIVE:     Vital signs in last 24 hours: Temp:  [97.8 F (36.6 C)-100.5 F (38.1 C)] 99.8 F (37.7 C) (04/17 1032) Pulse Rate:  [78-105] 78 (04/17 1032) Resp:  [17-22] 20 (04/17 0528) BP: (99-146)/(57-91) 133/70 (04/17 1032) SpO2:  [99 %-100 %] 99 % (04/17 1032) Weight:  [91 kg] 91 kg (04/17 0528) Last BM Date: (pta) General:   Alert, well-developed male in NAD EENT:  Normal hearing, non icteric sclera, conjunctive pink.  Heart:  Regular rate and rhythm; no murmur.  No lower extremity edema   Pulm: Normal respiratory effort, lungs CTA bilaterally without wheezes or crackles. Abdomen:  Soft, nondistended, nontender.  Normal bowel sounds,.       Neurologic:  Alert and  oriented x4;  grossly normal neurologically. Psych:  Pleasant, cooperative.  Normal mood and affect.   Intake/Output from previous day: 04/16 0701 - 04/17 0700 In: -  Out: 450 [Urine:450] Intake/Output this shift: No intake/output data recorded.  Lab Results: Recent Labs    09/24/18 1414 09/25/18 1235 09/26/18 0011 09/26/18 0500  WBC 10.5 9.0  --  8.4  HGB 6.8* 6.3* 7.2* 6.9*  HCT 23.5* 24.0* 25.8* 25.0*  PLT 381 330  --   288   BMET Recent Labs    09/25/18 1235 09/26/18 0500  NA 144 143  K 3.8 3.5  CL 95* 89*  CO2 41* >50*  GLUCOSE 140* 89  BUN 12 8  CREATININE 0.77 0.79  CALCIUM 8.5* 8.8*    Dg Chest Portable 1 View  Result Date: 09/25/2018 CLINICAL DATA:  Anemia.  No chest complaints. EXAM: PORTABLE CHEST 1 VIEW COMPARISON:  Chest x-ray dated September 04, 2018. FINDINGS: The heart size and mediastinal contours are within normal limits. Atherosclerotic calcification of the aortic arch. Normal pulmonary vascularity. The lungs remain hyperinflated with emphysematous changes mild and mild interstitial coarsening. No focal consolidation, pleural effusion, or pneumothorax. No acute osseous abnormality. IMPRESSION: 1.  No active cardiopulmonary disease. 2. COPD. Electronically Signed   By: Obie Dredge M.D.   On: 09/25/2018 13:19    Active Problems:   Symptomatic anemia   Occult GI bleeding   Heme positive stool     LOS: 0 days   Jared [name] ,NP 09/26/2018, 11:39 AM

## 2018-09-26 NOTE — Evaluation (Signed)
Physical Therapy Evaluation Patient Details Name: Jared [name] MRN: [id number] DOB: 08/12/1955 Today's Date: 09/26/2018   History of Present Illness  Pt adm with symptomatic anemia with Hgb of 6.2. Transfused 2 units and GI workup in progress. Pt adm for the same 3 wks prior. PMhx: COPD on home O2, CAD, CHF, CVA, HTN, Aflutter  Clinical Impression  Pt presents to PT at baseline level of mobility. At baseline pt ambulates typically in the home and does not go out into the community. No further PT recommended.    Follow Up Recommendations No PT follow up    Equipment Recommendations  None recommended by PT    Recommendations for Other Services       Precautions / Restrictions        Mobility  Bed Mobility Overal bed mobility: Independent                Transfers Overall transfer level: Independent                  Ambulation/Gait Ambulation/Gait assistance: Supervision Gait Distance (Feet): 300 Feet Assistive device: None Gait Pattern/deviations: WFL(Within Functional Limits) Gait velocity: decr Gait velocity interpretation: >2.62 ft/sec, indicative of community ambulatory General Gait Details: supervision for lines. Pt amb on 4L of O2 with SpO2 96% after walk and dyspnea 3/4.  Stairs            Wheelchair Mobility    Modified Rankin (Stroke Patients Only)       Balance Overall balance assessment: No apparent balance deficits (not formally assessed)                                           Pertinent Vitals/Pain Pain Assessment: No/denies pain    Home Living Family/patient expects to be discharged to:: Group home Living Arrangements: Other (Comment)(lives in a boarding house) Available Help at Discharge: Family;Available PRN/intermittently Type of Home: House Home Access: Stairs to enter Entrance Stairs-Rails: Right Entrance Stairs-Number of Steps: 4 Home Layout: One level Home Equipment: None Additional Comments: 4  steps, single level group home, with tub shower    Prior Function Level of Independence: Needs assistance   Gait / Transfers Assistance Needed: independent  ADL's / Homemaking Assistance Needed: family does his shopping  Comments: Pt reports he stays at home and doesn't go out     Hand Dominance   Dominant Hand: Right    Extremity/Trunk Assessment   Upper Extremity Assessment Upper Extremity Assessment: Overall WFL for tasks assessed    Lower Extremity Assessment Lower Extremity Assessment: Overall WFL for tasks assessed    Cervical / Trunk Assessment Cervical / Trunk Assessment: Normal  Communication   Communication: No difficulties  Cognition Arousal/Alertness: Awake/alert Behavior During Therapy: WFL for tasks assessed/performed Overall Cognitive Status: Within Functional Limits for tasks assessed                                        General Comments      Exercises     Assessment/Plan    PT Assessment Patent does not need any further PT services  PT Problem List         PT Treatment Interventions      PT Goals (Current goals can be found in the Care Plan section)  Acute Rehab  PT Goals PT Goal Formulation: All assessment and education complete, DC therapy    Frequency     Barriers to discharge        Co-evaluation               AM-PAC PT "6 Clicks" Mobility  Outcome Measure Help needed turning from your back to your side while in a flat bed without using bedrails?: None Help needed moving from lying on your back to sitting on the side of a flat bed without using bedrails?: None Help needed moving to and from a bed to a chair (including a wheelchair)?: None Help needed standing up from a chair using your arms (e.g., wheelchair or bedside chair)?: None Help needed to walk in hospital room?: None Help needed climbing 3-5 steps with a railing? : A Little 6 Click Score: 23    End of Session Equipment Utilized During  Treatment: Oxygen Activity Tolerance: Patient tolerated treatment well Patient left: in bed;with call bell/phone within reach   PT Visit Diagnosis: Other abnormalities of gait and mobility (R26.89)    Time: 8101-7510 PT Time Calculation (min) (ACUTE ONLY): 9 min   Charges:   PT Evaluation $PT Eval Low Complexity: 1 Low          [facility] PT Acute Rehabilitation Services Pager [phone] Office [phone]   Angelina Ok [facility] 09/26/2018, 3:28 PM

## 2018-09-26 NOTE — Progress Notes (Addendum)
Progress Note    Jared [name]  EYE:[id number] DOB: August 31, 1955  DOA: 09/25/2018 PCP: [name], MD    Brief Narrative:   Chief complaint: Generalized weakness  Medical records reviewed and are as summarized below:  Jared [name] is an 63 y.o. male with past medical history significant for CAD, combined systolic and diastolic CHF, COPD on chronic oxygen 3 L, tobacco abuse, CVA, and HTN; who presents with generalized weakness found to have hemoglobin 6.3 g/dL.  Assessment/Plan:   Principal Problem:   Symptomatic anemia Active Problems:   Prediabetes   Chronic combined systolic and diastolic CHF (congestive heart failure) (HCC)   Atrial flutter (HCC)   Chronic respiratory failure with hypoxia (HCC)   Occult GI bleeding   Heme positive stool  Acute blood loss anemia/symptomatic anemia GI bleed: Patient with history of same with recent extensive evaluation by gastroenterologist for which he underwent EGD, colonoscopy, and capsule endoscopy.  On admission hemoglobin 6.3. -N.p.o. -Continue transfuse 1 unit of packed red blood cells -Follow-up capsule endoscopy results -Appreciate gastroenterology consultative services will follow-up for further recommendations  Paroxysmal atrial flutter: Patient appears to be rate controlled.  Gastroenterology had wanted the patient to be continued on Xarelto, but it had been missed and not reordered last night. -Continue Xarelto per GI recommendation  COPD with chronic hypoxic respiratory failure: Patient reports breathing at baseline currently on 3 L nasal cannula oxygen.  Counseled the patient on the need of cessation of tobacco use -Continue home breathing treatments/inhalers   Chronic combined systolic and diastolic congestive heart failure: Last EF noted to be 45 to 50% in 2019.  Patient appears euvolemic at this time. -Continue home Lasix  Tremor: Patient notes intermittent generalized tremor even before hospitalization.  Potassium at  the lower limit of normal and calcium mildly low.  -Giving replacement potassium and calcium  Coronary artery disease -Continue statin  Hyperlipidemia -Continue statin  Chronic liver disease: Followed by Dr. Myrtie Neither in outpatient setting.  Prediabetes: Last hemoglobin A1c noted to be 6.3 in 05/2006.  Patient not on any diabetic medications.  Tobacco use -Seen above  Body mass index is 25.07 kg/m.   Family Communication/Anticipated D/C date and plan/Code Status   DVT prophylaxis: xarelto Code Status: Full Code.  Family Communication: No family present at bedside Disposition Plan: TBD   Medical Consultants:    Gastroenterology   Anti-Infectives:    None  Subjective:   Patient reports that he has had intermittent jerks that started prior to when coming into the hospital. Objective:    Vitals:   09/25/18 2141 09/26/18 0012 09/26/18 0136 09/26/18 0528  BP: 140/75 134/74 129/66 139/82  Pulse: 97 90 80 89  Resp: (!) 22 20  20   Temp: (!) 100.5 F (38.1 C) 99.6 F (37.6 C)  99 F (37.2 C)  TempSrc: Oral Oral  Oral  SpO2: 100% 100% 100% 100%  Weight:    91 kg  Height:        Intake/Output Summary (Last 24 hours) at 09/26/2018 0729 Last data filed at 09/26/2018 0200 Gross per 24 hour  Intake -  Output 450 ml  Net -450 ml   Filed Weights   09/25/18 1756 09/26/18 0528  Weight: 91 kg 91 kg    Exam: Constitutional: NAD, calm, comfortable Eyes: PERRL, lids and conjunctivae normal ENMT: Mucous membranes are moist. Posterior pharynx clear of any exudate or lesions.  Neck: normal, supple, no masses, no thyromegaly Respiratory: Decreased overall aeration. Normal respiratory effort. No  accessory muscle use.  On 3 L of nasal cannula oxygen. Cardiovascular: Regular rate and rhythm, no murmurs / rubs / gallops. No extremity edema. 2+ pedal pulses. No carotid bruits.  Abdomen: no tenderness, no masses palpated. No hepatosplenomegaly. Bowel sounds positive.   Musculoskeletal: no clubbing / cyanosis. No joint deformity upper and lower extremities. Good ROM, no contractures. Normal muscle tone.  Skin: no rashes, lesions, ulcers. No induration Neurologic: CN 2-12 grossly intact. Sensation intact, DTR normal. Strength 5/5 in all 4.  Psychiatric: Normal judgment and insight. Alert and oriented x 3. Normal mood.    Data Reviewed:   I have personally reviewed following labs and imaging studies:  Labs: Labs show the following:   Basic Metabolic Panel: Recent Labs  Lab 09/25/18 1235 09/26/18 0500  NA 144 143  K 3.8 3.5  CL 95* 89*  CO2 41* >50*  GLUCOSE 140* 89  BUN 12 8  CREATININE 0.77 0.79  CALCIUM 8.5* 8.8*   GFR Estimated Creatinine Clearance: 111.5 mL/min (by C-G formula based on SCr of 0.79 mg/dL). Liver Function Tests: No results for input(s): AST, ALT, ALKPHOS, BILITOT, PROT, ALBUMIN in the last 168 hours. No results for input(s): LIPASE, AMYLASE in the last 168 hours. No results for input(s): AMMONIA in the last 168 hours. Coagulation profile No results for input(s): INR, PROTIME in the last 168 hours.  CBC: Recent Labs  Lab 09/24/18 1414 09/25/18 1235 09/26/18 0011 09/26/18 0500  WBC 10.5 9.0  --  8.4  NEUTROABS 5.7 6.8  --   --   HGB 6.8* 6.3* 7.2* 6.9*  HCT 23.5* 24.0* 25.8* 25.0*  MCV 86 90.9  --  89.9  PLT 381 330  --  288   Cardiac Enzymes: No results for input(s): CKTOTAL, CKMB, CKMBINDEX, TROPONINI in the last 168 hours. BNP (last 3 results) No results for input(s): PROBNP in the last 8760 hours. CBG: Recent Labs  Lab 09/25/18 2328  GLUCAP 100*   D-Dimer: No results for input(s): DDIMER in the last 72 hours. Hgb A1c: No results for input(s): HGBA1C in the last 72 hours. Lipid Profile: No results for input(s): CHOL, HDL, LDLCALC, TRIG, CHOLHDL, LDLDIRECT in the last 72 hours. Thyroid function studies: No results for input(s): TSH, T4TOTAL, T3FREE, THYROIDAB in the last 72 hours.  Invalid  input(s): FREET3 Anemia work up: Recent Labs    09/25/18 1235  VITAMINB12 602  FOLATE 14.7  FERRITIN 6*  TIBC 375  IRON 59  RETICCTPCT 5.6*   Sepsis Labs: Recent Labs  Lab 09/24/18 1414 09/25/18 1235 09/26/18 0500  WBC 10.5 9.0 8.4    Microbiology No results found for this or any previous visit (from the past 240 hour(s)).  Procedures and diagnostic studies:  Dg Chest Portable 1 View  Result Date: 09/25/2018 CLINICAL DATA:  Anemia.  No chest complaints. EXAM: PORTABLE CHEST 1 VIEW COMPARISON:  Chest x-ray dated September 04, 2018. FINDINGS: The heart size and mediastinal contours are within normal limits. Atherosclerotic calcification of the aortic arch. Normal pulmonary vascularity. The lungs remain hyperinflated with emphysematous changes mild and mild interstitial coarsening. No focal consolidation, pleural effusion, or pneumothorax. No acute osseous abnormality. IMPRESSION: 1.  No active cardiopulmonary disease. 2. COPD. Electronically Signed   By: Obie Dredge M.D.   On: 09/25/2018 13:19    Medications:   . atorvastatin  80 mg Oral Daily  . carvedilol  3.125 mg Oral BID  . ferrous sulfate  325 mg Oral Q breakfast  . fluticasone  furoate-vilanterol  1 puff Inhalation Daily  . furosemide  40 mg Oral Daily  . gabapentin  300 mg Oral QHS  . loratadine  10 mg Oral Daily   Continuous Infusions:   LOS: 0 days   Rondell A Smith  [facility]   *Please refer to [email or web address], password [email or web address] to get updated schedule on who will round on this patient, as hospitalists switch teams weekly. If 7PM-7AM, please contact night-coverage at [email or web address], password [email or web address] for any overnight needs.

## 2018-09-26 NOTE — Progress Notes (Addendum)
Patient has urinated at least 2x and has been pouring out his urine before we could measure-  Explain to patient need for [place] to measure.  Pt states he will let us know when he urinates so we can empty it.

## 2018-09-26 NOTE — Progress Notes (Signed)
Patient had an episode of SOB after walking to bathroom.  Shortly after had an episode of chest tightness.   Just finished blood product and started potassium IV.   Post blood vitals in computer.   Check ECG- by the time finished with ECG- tightness resolved.   Will continue to monitor.

## 2018-09-26 NOTE — TOC Initial Note (Signed)
Transition of Care [facility]) - Initial/Assessment Note    Patient Details  Name: Jared [name] MRN: [id number] Date of Birth: August 09, 1955  [facility] CM/SW Contact:    Reola Mosher Phone Number: [phone] 09/26/2018, 3:09 PM  Clinical Narrative:                 Patient lives in a boarding, supportive sister that assist as needed; PCP: [name], MD; has private insurance with Medicaid with prescription drug coverage; pharmacy of choice is [facility], [facility]; DME - home oxygen through [employer]; pt continue to smoke, pt encourage to stop smoking. He stated " I need someone to cook for me." He is independent of his ADL's; CM placed a referral to [employer] Whe[employer]; Also application given for transportation [place] for the patient to complete and mail off.   Expected Discharge Plan: Home/Self Care Barriers to Discharge: No Barriers Identified   Patient Goals and CMS Choice Patient states their goals for this hospitalization and ongoing recovery are:: to get stronger CMS Medicare.gov Compare Post Acute Care list provided to:: Patient Choice offered to / list presented to : Patient  Expected Discharge Plan and Services Expected Discharge Plan: Home/Self Care In-house Referral: NA Discharge Planning Services: CM Consult Post Acute Care Choice: Durable Medical Equipment Living arrangements for the past 2 months: Apartment                 DME Arranged: N/A DME Agency: NA HH Arranged: NA    Prior Living Arrangements/Services Living arrangements for the past 2 months: Apartment Lives with:: Self Patient language and need for interpreter reviewed:: No Do you feel safe going back to the place where you live?: Yes      Need for Family Participation in Patient Care: No (Comment) Care giver support system in place?: Yes (comment)(sister supportive)      Activities of Daily Living Home Assistive Devices/Equipment: None ADL Screening (condition at time of  admission) Patient's cognitive ability adequate to safely complete daily activities?: Yes Is the patient deaf or have difficulty hearing?: No Does the patient have difficulty seeing, even when wearing glasses/contacts?: No Does the patient have difficulty concentrating, remembering, or making decisions?: No Patient able to express need for assistance with ADLs?: Yes Does the patient have difficulty dressing or bathing?: Yes Independently performs ADLs?: Yes (appropriate for developmental age) Does the patient have difficulty walking or climbing stairs?: Yes Weakness of Legs: Both Weakness of Arms/Hands: Both  Permission Sought/Granted Permission sought to share information with : Case Manager Permission granted to share information with : Yes, Verbal Permission Granted  Share Information with NAME: [name] sister           Emotional Assessment Appearance:: Developmentally appropriate Attitude/Demeanor/Rapport: Engaged Affect (typically observed): Accepting Orientation: : Oriented to Self, Oriented to  Time, Oriented to Place, Oriented to Situation   Psych Involvement: No (comment)  Admission diagnosis:  Symptomatic anemia [D64.9] Gastrointestinal hemorrhage, unspecified gastrointestinal hemorrhage type [K92.2] Patient Active Problem List   Diagnosis Date Noted  . Gastrointestinal hemorrhage   . Occult GI bleeding   . Heme positive stool   . Iron deficiency anemia   . Symptomatic anemia 09/04/2018  . Tobacco use disorder 09/04/2018  . Noncompliance 06/14/2017  . Elevated LFTs 08/13/2016  . Chronic respiratory failure with hypoxia (HCC) 07/24/2016  . Atrial flutter (HCC)   . Hepatic congestion 07/02/2016  . Chronic combined systolic and diastolic CHF (congestive heart failure) (HCC) 07/01/2016  . COPD with acute  exacerbation (HCC) 06/03/2016  . Prediabetes 05/14/2016  . Hemoptysis 05/06/2016  . COPD GOLD III/still smoking  02/29/2016  . Cigarette smoker 01/09/2016  . CAD  (coronary artery disease) 10/07/2013  . 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 Centrilobular emphysema (HCC) 10/04/2013  . Pulmonary infiltrate in right lung on CXR 10/03/2013  . Acute on chronic respiratory failure with hypoxia (HCC) 10/03/2013  . Essential hypertension    PCP:  [name], MD Pharmacy:   [facility] [place], [place] - [place] [address] [place] [address] Phone: [phone] Fax: [phone]     Social Determinants of Health (SDOH) Interventions    Readmission Risk Interventions No flowsheet data found.

## 2018-09-27 DIAGNOSIS — Z7901 Long term (current) use of anticoagulants: Secondary | ICD-10-CM

## 2018-09-27 DIAGNOSIS — F17200 Nicotine dependence, unspecified, uncomplicated: Secondary | ICD-10-CM

## 2018-09-27 LAB — TYPE AND SCREEN
ABO/RH(D): O POS
Antibody Screen: NEGATIVE
Unit division: 0
Unit division: 0

## 2018-09-27 LAB — CBC
HCT: 28.2 % — ABNORMAL LOW (ref 39.0–52.0)
Hemoglobin: 8.3 g/dL — ABNORMAL LOW (ref 13.0–17.0)
MCH: 26.3 pg (ref 26.0–34.0)
MCHC: 29.4 g/dL — ABNORMAL LOW (ref 30.0–36.0)
MCV: 89.2 fL (ref 80.0–100.0)
Platelets: 273 10*3/uL (ref 150–400)
RBC: 3.16 MIL/uL — ABNORMAL LOW (ref 4.22–5.81)
RDW: 15.9 % — ABNORMAL HIGH (ref 11.5–15.5)
WBC: 9 10*3/uL (ref 4.0–10.5)
nRBC: 0.2 % (ref 0.0–0.2)

## 2018-09-27 LAB — BPAM RBC
Blood Product Expiration Date: 202004232359
Blood Product Expiration Date: 202004302359
ISSUE DATE / TIME: 202004161806
ISSUE DATE / TIME: 202004171009
Unit Type and Rh: 5100
Unit Type and Rh: 5100

## 2018-09-27 MED ORDER — SODIUM CHLORIDE 0.9 % IV SOLN
INTRAVENOUS | Status: DC
  Administered 2018-09-27: 18:00:00 via INTRAVENOUS

## 2018-09-27 MED ORDER — NICOTINE 14 MG/24HR TD PT24
14.0000 mg | MEDICATED_PATCH | Freq: Every day | TRANSDERMAL | Status: DC
  Administered 2018-09-27 – 2018-09-28 (×2): 14 mg via TRANSDERMAL
  Filled 2018-09-27 (×3): qty 1

## 2018-09-27 NOTE — Progress Notes (Signed)
RN reminded patient to use urinal and leave urine for staff to measure.

## 2018-09-27 NOTE — H&P (View-Only) (Signed)
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  It is not clear if the bleed site is in the mid to latter part of the duodenum or the proximal jejunum.  An enteroscopy will be performed tomorrow to address the issue.  The patient ate a full breakfast this morning.  He is hemodynamically stable as well as his HGB, however, he will be kept on his Xarelto to increase the yield with endoscopy tomorrow.

## 2018-09-27 NOTE — Progress Notes (Addendum)
Progress Note    Cordel Whipp  CVU:[id number] DOB: January 23, 1956  DOA: 09/25/2018 PCP: [name], MD    Brief Narrative:   Chief complaint: Generalized weakness  Medical records reviewed and are as summarized below:  Wilner Laurich is an 63 y.o. male with past medical history significant for CAD, combined systolic and diastolic CHF, COPD on chronic oxygen 3 L, tobacco abuse, CVA, and HTN; who presents with generalized weakness found to have hemoglobin 6.3 g/dL.  Assessment/Plan:   Principal Problem:   Symptomatic anemia Active Problems:   Prediabetes   Chronic combined systolic and diastolic CHF (congestive heart failure) (HCC)   Atrial flutter (HCC)   Chronic respiratory failure with hypoxia (HCC)   Tobacco use disorder   Occult GI bleeding   Gastrointestinal hemorrhage   Chronic anticoagulation  Acute blood loss anemia, symptomatic anemia, upper GI bleed: Patient with history of same with recent extensive evaluation by gastroenterologist for which he underwent EGD, colonoscopy, and capsule endoscopy in 08/2018.  On admission hemoglobin noticed low as 6.3 which patient was transfused first unit of blood, but required second unit of blood when hemoglobin dropped down to 6.9 on 4/17.  Hemoglobin currently stable at 8.3.  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 -Clear liquid diet and n.p.o. after midnight -Plan for upper enteroscopy tomorrow a.m. -Appreciate gastroenterology consultative services, will follow-up for further recommendations  Paroxysmal atrial flutter on chronic anticoagulation: Patient appears to be rate controlled.  Gastroenterology had wanted the patient to be continued on Xarelto, but it had been missed and not reordered last night. -Continue Xarelto per GI recommendation  COPD with chronic hypoxic respiratory failure: Patient reports breathing at baseline currently on 3 L nasal cannula oxygen.   Counseled the patient on the need of cessation of tobacco use -Continue nasal cannula oxygen -Continue home breathing treatments/inhalers   Chronic combined systolic and diastolic congestive heart failure: Last EF noted to be 45 to 50% in 2019.  Patient appears euvolemic at this time. -Continue home Lasix  Tremor: Patient notes intermittent generalized tremor even before hospitalization.  Potassium at the lower limit of normal and calcium mildly low.  Patient reports tremors could have been associated with him needing a nicotine patch.  Coronary artery disease -Continue statin  Hyperlipidemia -Continue statin  Chronic liver disease: Followed by Dr. Myrtie Neither in outpatient setting.  Prediabetes: Last hemoglobin A1c noted to be 6.3 in 05/2016.  Patient not on any diabetic medications.  Tobacco use -Nicotine patch  Body mass index is 24.65 kg/m.   Family Communication/Anticipated D/C date and plan/Code Status   DVT prophylaxis: xarelto Code Status: Full Code.  Family Communication: No family present at bedside Disposition Plan: TBD   Medical Consultants:    Gastroenterology   Anti-Infectives:    None  Subjective:   Patient reports that he had one bowel movement last night without any signs of bleeding.  Denies having any worsening shortness of breath.  He chronically has some mild expiratory wheezing. Objective:    Vitals:   09/26/18 1942 09/27/18 0035 09/27/18 0547 09/27/18 0836  BP: (!) 143/71 (!) 149/82 135/84 123/70  Pulse: 83 75 88 83  Resp: 18 18 18    Temp: 98.8 F (37.1 C) 98 F (36.7 C) 99 F (37.2 C)   TempSrc: Oral  Oral   SpO2: 100% 100% 100% 99%  Weight:   89.4 kg   Height:        Intake/Output  Summary (Last 24 hours) at 09/27/2018 1115 Last data filed at 09/27/2018 0845 Gross per 24 hour  Intake 2101.56 ml  Output 202 ml  Net 1899.56 ml   Filed Weights   09/25/18 1756 09/26/18 0528 09/27/18 0547  Weight: 91 kg 91 kg 89.4 kg    Exam:  Constitutional: NAD, calm, comfortable Eyes: PERRL, lids and conjunctivae normal ENMT: Mucous membranes are moist. Posterior pharynx clear of any exudate or lesions.  Neck: normal, supple, no masses, no thyromegaly Respiratory: Decreased overall aeration with persistent mild expiratory wheeze. Normal respiratory effort. No accessory muscle use.  On 3 L of nasal cannula oxygen. Cardiovascular: Regular rate and rhythm, no murmurs / rubs / gallops. No extremity edema. 2+ pedal pulses. No carotid bruits.  Abdomen: no tenderness, no masses palpated. No hepatosplenomegaly. Bowel sounds positive.  Musculoskeletal: no clubbing / cyanosis. No joint deformity upper and lower extremities. Good ROM, no contractures. Normal muscle tone.  Skin: no rashes, lesions, ulcers. No induration Neurologic: CN 2-12 grossly intact. Sensation intact, DTR normal. Strength 5/5 in all 4.  Psychiatric: Normal judgment and insight. Alert and oriented x 3. Normal mood.    Data Reviewed:   I have personally reviewed following labs and imaging studies:  Labs: Labs show the following:   Basic Metabolic Panel: Recent Labs  Lab 09/25/18 1235 09/26/18 0500  NA 144 143  K 3.8 3.5  CL 95* 89*  CO2 41* >50*  GLUCOSE 140* 89  BUN 12 8  CREATININE 0.77 0.79  CALCIUM 8.5* 8.8*   GFR Estimated Creatinine Clearance: 111.5 mL/min (by C-G formula based on SCr of 0.79 mg/dL). Liver Function Tests: No results for input(s): AST, ALT, ALKPHOS, BILITOT, PROT, ALBUMIN in the last 168 hours. No results for input(s): LIPASE, AMYLASE in the last 168 hours. No results for input(s): AMMONIA in the last 168 hours. Coagulation profile No results for input(s): INR, PROTIME in the last 168 hours.  CBC: Recent Labs  Lab 09/24/18 1414 09/25/18 1235 09/26/18 0011 09/26/18 0500 09/26/18 1708 09/27/18 0713  WBC 10.5 9.0  --  8.4  --  9.0  NEUTROABS 5.7 6.8  --   --   --   --   HGB 6.8* 6.3* 7.2* 6.9* 8.3* 8.3*  HCT 23.5* 24.0*  25.8* 25.0* 28.7* 28.2*  MCV 86 90.9  --  89.9  --  89.2  PLT 381 330  --  288  --  273   Cardiac Enzymes: No results for input(s): CKTOTAL, CKMB, CKMBINDEX, TROPONINI in the last 168 hours. BNP (last 3 results) No results for input(s): PROBNP in the last 8760 hours. CBG: Recent Labs  Lab 09/25/18 2328  GLUCAP 100*   D-Dimer: No results for input(s): DDIMER in the last 72 hours. Hgb A1c: No results for input(s): HGBA1C in the last 72 hours. Lipid Profile: No results for input(s): CHOL, HDL, LDLCALC, TRIG, CHOLHDL, LDLDIRECT in the last 72 hours. Thyroid function studies: No results for input(s): TSH, T4TOTAL, T3FREE, THYROIDAB in the last 72 hours.  Invalid input(s): FREET3 Anemia work up: Recent Labs    09/25/18 1235  VITAMINB12 602  FOLATE 14.7  FERRITIN 6*  TIBC 375  IRON 59  RETICCTPCT 5.6*   Sepsis Labs: Recent Labs  Lab 09/24/18 1414 09/25/18 1235 09/26/18 0500 09/27/18 0713  WBC 10.5 9.0 8.4 9.0    Microbiology No results found for this or any previous visit (from the past 240 hour(s)).  Procedures and diagnostic studies:  Dg Chest Portable 1  View  Result Date: 09/25/2018 CLINICAL DATA:  Anemia.  No chest complaints. EXAM: PORTABLE CHEST 1 VIEW COMPARISON:  Chest x-ray dated September 04, 2018. FINDINGS: The heart size and mediastinal contours are within normal limits. Atherosclerotic calcification of the aortic arch. Normal pulmonary vascularity. The lungs remain hyperinflated with emphysematous changes mild and mild interstitial coarsening. No focal consolidation, pleural effusion, or pneumothorax. No acute osseous abnormality. IMPRESSION: 1.  No active cardiopulmonary disease. 2. COPD. Electronically Signed   By: Obie Dredge M.D.   On: 09/25/2018 13:19    Medications:   . atorvastatin  80 mg Oral Daily  . carvedilol  3.125 mg Oral BID  . ferrous sulfate  325 mg Oral Q breakfast  . fluticasone furoate-vilanterol  1 puff Inhalation Daily  .  furosemide  40 mg Oral Daily  . gabapentin  300 mg Oral QHS  . loratadine  10 mg Oral Daily  . nicotine  14 mg Transdermal Daily  . rivaroxaban  20 mg Oral Q supper   Continuous Infusions: . sodium chloride Stopped (09/26/18 2024)     LOS: 1 day    A   [facility]   *Please refer to [email or web address], password [email or web address] to get updated schedule on who will round on this patient, as hospitalists switch teams weekly. If 7PM-7AM, please contact night-coverage at [email or web address], password [email or web address] for any overnight needs.

## 2018-09-27 NOTE — Progress Notes (Signed)
Patient complaining of tremors through his body.  Reports he feels lightheaded/dizzy and sometimes lose his balance, but never has an incontinent episode.   Pt reports they have been getting more frequent over the last 3-4 weeks (since last hospital visit).

## 2018-09-27 NOTE — Progress Notes (Signed)
Patient still not using urinal after reminder.  Two episodes of output throughout the night per patient

## 2018-09-27 NOTE — Plan of Care (Cosign Needed)
  Problem: Education: Goal: Knowledge of General Education information will improve Description Including pain rating scale, medication(s)/side effects and non-pharmacologic comfort measures Outcome: Progressing   Problem: Health Behavior/Discharge Planning: Goal: Ability to manage health-related needs will improve Outcome: Progressing   Problem: Clinical Measurements: Goal: Will remain free from infection Outcome: Progressing Goal: Diagnostic test results will improve Outcome: Progressing Goal: Respiratory complications will improve Outcome: Progressing   Problem: Activity: Goal: Risk for activity intolerance will decrease Outcome: Progressing   Problem: Safety: Goal: Ability to remain free from injury will improve Outcome: Progressing   Problem: Activity: Goal: Capacity to carry out activities will improve Outcome: Progressing

## 2018-09-27 NOTE — Progress Notes (Signed)
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  It is not clear if the bleed site is in the mid to latter part of the duodenum or the proximal jejunum.  An enteroscopy will be performed tomorrow to address the issue.  The patient ate a full breakfast this morning.  He is hemodynamically stable as well as his HGB, however, he will be kept on his Xarelto to increase the yield with endoscopy tomorrow. 

## 2018-09-28 ENCOUNTER — Inpatient Hospital Stay (HOSPITAL_COMMUNITY): Payer: Medicaid Other | Admitting: Anesthesiology

## 2018-09-28 ENCOUNTER — Encounter (HOSPITAL_COMMUNITY): Payer: Self-pay | Admitting: *Deleted

## 2018-09-28 ENCOUNTER — Encounter (HOSPITAL_COMMUNITY)
Admission: EM | Disposition: A | Discharge status: Home / Self Care | Payer: Self-pay | Source: Home / Self Care | Attending: Internal Medicine

## 2018-09-28 DIAGNOSIS — Z7901 Long term (current) use of anticoagulants: Secondary | ICD-10-CM

## 2018-09-28 HISTORY — PX: ENTEROSCOPY: SHX5533

## 2018-09-28 LAB — BASIC METABOLIC PANEL
Anion gap: 8 (ref 5–15)
BUN: 5 mg/dL — ABNORMAL LOW (ref 8–23)
CO2: 43 mmol/L — ABNORMAL HIGH (ref 22–32)
Calcium: 8.9 mg/dL (ref 8.9–10.3)
Chloride: 90 mmol/L — ABNORMAL LOW (ref 98–111)
Creatinine, Ser: 0.71 mg/dL (ref 0.61–1.24)
GFR calc Af Amer: >60 mL/min (ref >60)
GFR calc non Af Amer: >60 mL/min (ref >60)
Glucose, Bld: 93 mg/dL (ref 70–99)
Potassium: 3.6 mmol/L (ref 3.5–5.1)
Sodium: 141 mmol/L (ref 135–145)

## 2018-09-28 LAB — CBC WITH DIFFERENTIAL/PLATELET
Abs Immature Granulocytes: 0.02 10*3/uL (ref 0.00–0.07)
Basophils Absolute: 0.1 10*3/uL (ref 0.0–0.1)
Basophils Relative: 1 %
Eosinophils Absolute: 0.2 10*3/uL (ref 0.0–0.5)
Eosinophils Relative: 2 %
HCT: 29.1 % — ABNORMAL LOW (ref 39.0–52.0)
Hemoglobin: 8.3 g/dL — ABNORMAL LOW (ref 13.0–17.0)
Immature Granulocytes: 0 %
Lymphocytes Relative: 25 %
Lymphs Abs: 2.1 10*3/uL (ref 0.7–4.0)
MCH: 25.5 pg — ABNORMAL LOW (ref 26.0–34.0)
MCHC: 28.5 g/dL — ABNORMAL LOW (ref 30.0–36.0)
MCV: 89.3 fL (ref 80.0–100.0)
Monocytes Absolute: 0.9 10*3/uL (ref 0.1–1.0)
Monocytes Relative: 11 %
Neutro Abs: 5 10*3/uL (ref 1.7–7.7)
Neutrophils Relative %: 61 %
Platelets: 266 10*3/uL (ref 150–400)
RBC: 3.26 MIL/uL — ABNORMAL LOW (ref 4.22–5.81)
RDW: 16 % — ABNORMAL HIGH (ref 11.5–15.5)
WBC: 8.2 10*3/uL (ref 4.0–10.5)
nRBC: 0 % (ref 0.0–0.2)

## 2018-09-28 SURGERY — ENTEROSCOPY
Anesthesia: Monitor Anesthesia Care

## 2018-09-28 MED ORDER — PROPOFOL 10 MG/ML IV BOLUS
INTRAVENOUS | Status: DC | PRN
  Administered 2018-09-28: 70 mg via INTRAVENOUS

## 2018-09-28 MED ORDER — SUCCINYLCHOLINE CHLORIDE 200 MG/10ML IV SOSY
PREFILLED_SYRINGE | INTRAVENOUS | Status: DC | PRN
  Administered 2018-09-28: 100 mg via INTRAVENOUS

## 2018-09-28 MED ORDER — FENTANYL CITRATE (PF) 250 MCG/5ML IJ SOLN
INTRAMUSCULAR | Status: DC | PRN
  Administered 2018-09-28: 100 ug via INTRAVENOUS

## 2018-09-28 MED ORDER — PHENYLEPHRINE 40 MCG/ML (10ML) SYRINGE FOR IV PUSH (FOR BLOOD PRESSURE SUPPORT)
PREFILLED_SYRINGE | INTRAVENOUS | Status: DC | PRN
  Administered 2018-09-28 (×2): 160 ug via INTRAVENOUS

## 2018-09-28 MED ORDER — LIDOCAINE 2% (20 MG/ML) 5 ML SYRINGE
INTRAMUSCULAR | Status: DC | PRN
  Administered 2018-09-28: 100 mg via INTRAVENOUS

## 2018-09-28 MED ORDER — IPRATROPIUM-ALBUTEROL 0.5-2.5 (3) MG/3ML IN SOLN
RESPIRATORY_TRACT | Status: AC
  Filled 2018-09-28: qty 3

## 2018-09-28 MED ORDER — ONDANSETRON HCL 4 MG/2ML IJ SOLN
INTRAMUSCULAR | Status: DC | PRN
  Administered 2018-09-28: 4 mg via INTRAVENOUS

## 2018-09-28 MED ORDER — LACTATED RINGERS IV SOLN
INTRAVENOUS | Status: DC
  Administered 2018-09-28: 1000 mL via INTRAVENOUS

## 2018-09-28 MED ORDER — MIDAZOLAM HCL 2 MG/2ML IJ SOLN
INTRAMUSCULAR | Status: DC | PRN
  Administered 2018-09-28: 2 mg via INTRAVENOUS

## 2018-09-28 NOTE — Anesthesia Procedure Notes (Signed)
Procedure Name: Intubation Date/Time: 09/28/2018 8:21 AM Performed by: Elayne Snare, CRNA Pre-anesthesia Checklist: Patient identified, Emergency Drugs available, Suction available and Patient being monitored Patient Re-evaluated:Patient Re-evaluated prior to induction Oxygen Delivery Method: Circle System Utilized Preoxygenation: Pre-oxygenation with 100% oxygen Induction Type: IV induction and Rapid sequence Laryngoscope Size: Mac and 4 Grade View: Grade I Tube type: Oral Tube size: 7.5 mm Number of attempts: 1 Airway Equipment and Method: Stylet and Oral airway Placement Confirmation: ETT inserted through vocal cords under direct vision,  positive ETCO2 and breath sounds checked- equal and bilateral Secured at: 23 cm Tube secured with: Tape Dental Injury: Teeth and Oropharynx as per pre-operative assessment

## 2018-09-28 NOTE — Op Note (Signed)
[facility] Patient Name: Jared [name] Procedure Date : 09/28/2018 MRN: [id number] Attending MD: Jeani Hawking , MD Date of Birth: April 21, 1956 CSN: [id number] Age: 64 Admit Type: Inpatient Procedure:                Small bowel enteroscopy Indications:              Arteriovenous malformation in the small intestine Providers:                Jeani Hawking, MD, Janae Sauce. Steele Berg, RN, Lawson Radar, Technician, Mirian Capuchin, CRNA Referring MD:              Medicines:                General Anesthesia Complications:            No immediate complications. Estimated Blood Loss:     Estimated blood loss: none. Procedure:                Pre-Anesthesia Assessment:                           - Prior to the procedure, a History and Physical                            was performed, and patient medications and                            allergies were reviewed. The patient's tolerance of                            previous anesthesia was also reviewed. The risks                            and benefits of the procedure and the sedation                            options and risks were discussed with the patient.                            All questions were answered, and informed consent                            was obtained. Prior Anticoagulants: The patient has                            taken no previous anticoagulant or antiplatelet                            agents. ASA Grade Assessment: III - A patient with                            severe systemic disease. After reviewing the risks  and benefits, the patient was deemed in                            satisfactory condition to undergo the procedure.                           - Sedation was administered by an anesthesia                            professional. General anesthesia was attained.                           After obtaining informed consent, the endoscope was                passed under direct vision. Throughout the                            procedure, the patient's blood pressure, pulse, and                            oxygen saturations were monitored continuously. The                            PCF-H190DL ([id number] Olympus pediatric colonoscope                            was introduced through the mouth and advanced to                            the small bowel distal to the Ligament of Treitz.                            The small bowel enteroscopy was accomplished                            without difficulty. The patient tolerated the                            procedure well. Scope In: Scope Out: Findings:      The esophagus was normal.      The stomach was normal.      The examined duodenum was normal.      There was no evidence of significant pathology in the entire examined       portion of jejunum.      Three slow passes were made into the small bowel. There were no       suspicious lesions or AVMs. Impression:               - Normal esophagus.                           - Normal stomach.                           - Normal examined duodenum.                           -  The examined portion of the jejunum was normal.                           - No specimens collected. Recommendation:           - Return patient to hospital ward for ongoing care.                           - Resume regular diet.                           - When rebleeding occurs, a repeat enteroscopy will                            be required. This can be performed as an outpatient                            as his HGB is stable.                           - Follow up with [facility]. Procedure Code(s):        --- Professional ---                           [id number], Small intestinal endoscopy, enteroscopy                            beyond second portion of duodenum, not including                            ileum; diagnostic, including collection of                             specimen(s) by brushing or washing, when performed                            (separate procedure) Diagnosis Code(s):        --- Professional ---                           Y78.295K31.819, Angiodysplasia of stomach and duodenum                            without bleeding CPT copyright 2019 [employer]. All rights reserved. The codes documented in this report are preliminary and upon coder review may  be revised to meet current compliance requirements. Jeani HawkingPatrick Fidela Cieslak, MD Jeani HawkingPatrick Takahiro Godinho, MD 09/28/2018 8:51:19 AM This report has been signed electronically. Number of Addenda: 0

## 2018-09-28 NOTE — Interval H&P Note (Signed)
History and Physical Interval Note:  09/28/2018 7:56 AM  Jared [name]  has presented today for surgery, with the diagnosis of Bleeding proximal small bowel AVMf.  The various methods of treatment have been discussed with the patient and family. After consideration of risks, benefits and other options for treatment, the patient has consented to  Procedure(s): ENTEROSCOPY (N/A) as a surgical intervention.  The patient's history has been reviewed, patient examined, no change in status, stable for surgery.  I have reviewed the patient's chart and labs.  Questions were answered to the patient's satisfaction.     Marvyn Torrez D

## 2018-09-28 NOTE — Anesthesia Preprocedure Evaluation (Signed)
Anesthesia Evaluation  Patient identified by MRN, date of birth, ID band Patient awake    Reviewed: Allergy & Precautions, NPO status , Patient's Chart, lab work & pertinent test results  Airway Mallampati: II  TM Distance: >3 FB Neck ROM: Full    Dental no notable dental hx.    Pulmonary COPD, Current Smoker,    Pulmonary exam normal breath sounds clear to auscultation       Cardiovascular hypertension, + CAD and +CHF  Normal cardiovascular exam Rhythm:Regular Rate:Normal     Neuro/Psych negative neurological ROS  negative psych ROS   GI/Hepatic negative GI ROS, Neg liver ROS,   Endo/Other  negative endocrine ROS  Renal/GU negative Renal ROS  negative genitourinary   Musculoskeletal negative musculoskeletal ROS (+)   Abdominal   Peds negative pediatric ROS (+)  Hematology  (+) anemia ,   Anesthesia Other Findings   Reproductive/Obstetrics negative OB ROS                             Anesthesia Physical Anesthesia Plan  ASA: III and emergent  Anesthesia Plan: MAC   Post-op Pain Management:    Induction: Intravenous  PONV Risk Score and Plan: 0  Airway Management Planned: Simple Face Mask  Additional Equipment:   Intra-op Plan:   Post-operative Plan:   Informed Consent: I have reviewed the patients History and Physical, chart, labs and discussed the procedure including the risks, benefits and alternatives for the proposed anesthesia with the patient or authorized representative who has indicated his/her understanding and acceptance.     Dental advisory given  Plan Discussed with: CRNA and Surgeon  Anesthesia Plan Comments:         Anesthesia Quick Evaluation

## 2018-09-28 NOTE — Transfer of Care (Signed)
Immediate Anesthesia Transfer of Care Note  Patient: Jared [name]  Procedure(s) Performed: ENTEROSCOPY (N/A )  Patient Location: PACU  Anesthesia Type:General  Level of Consciousness: drowsy and patient cooperative  Airway & Oxygen Therapy: Patient Spontanous Breathing and Patient connected to face mask oxygen  Post-op Assessment: Report given to RN and Post -op Vital signs reviewed and stable  Post vital signs: Reviewed and stable  Last Vitals:  Vitals Value Taken Time  BP 155/105 09/28/2018  8:55 AM  Temp 36.6 C 09/28/2018  8:55 AM  Pulse 106 09/28/2018  9:01 AM  Resp 26 09/28/2018  9:01 AM  SpO2 96 % 09/28/2018  9:01 AM  Vitals shown include unvalidated device data.  Last Pain:  Vitals:   09/28/18 0855  TempSrc: Axillary  PainSc: 0-No pain      Patients Stated Pain Goal: 0 (09/26/18 1425)  Complications: No apparent anesthesia complications

## 2018-09-28 NOTE — Discharge Summary (Signed)
Physician Discharge Summary  Patient ID: Jared [name] MRN: [id number] DOB/AGE: May 07, 1956 63 y.o.  Admit date: 09/25/2018 Discharge date: 09/28/2018  Admission Diagnoses:  Discharge Diagnoses:  Principal Problem:   Symptomatic anemia Active Problems:   Prediabetes   Chronic combined systolic and diastolic CHF (congestive heart failure) (HCC)   Atrial flutter (HCC)   Chronic respiratory failure with hypoxia (HCC)   Tobacco use disorder   Occult GI bleeding   Gastrointestinal hemorrhage   Chronic anticoagulation   Discharged Condition: stable  Reason for admission: HPI: Jared [name] is a 64 y.o. male with medical history significant of coronary artery disease, chronic combined systolic and diastolic CHF, COPD on chronic oxygen at home 3 L, ongoing tobacco use, history of CVA, hypertension, presents to the hospital with chief complaint of generalized weakness.  Patient was hospitalized and discharged on 09/07/2018 with similar symptoms found to have acute blood loss anemia in the setting of GI bleed.  Gastroenterology was consulted at that time and patient underwent an EGD on 3/27 which showed mild esophagitis.  He also underwent a colonoscopy which was normal.  This was followed by capsule endoscopy which was not resulted but as an outpatient.  Since discharge patient has continued to feel weak, fatigued, presented to the PCP and he was found to have low hemoglobin and directed back again to the emergency room.  Currently patient denies any chest pain, does complain of shortness of breath but he is at baseline and is not different than his normal breathing.  He continues to be on oxygen.  He denies any abdominal pain, nausea or vomiting.  He does complain of black stools that have been persistent since discharge last time.  He denies any diarrhea.  Hospital Course:  Patient admitted on 4/16 due to anemia and melenic stools.  Patient received a blood transfusion with improvement of his  hemoglobin.  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  This morning, patient had a EGD done without visualization of the AVM.  As the patient's hemoglobin has been stable since last night, will be discharged to follow-up with GI outpatient.  Patient will continue the Xarelto, as approved by GI.  Consults: GI  Significant Diagnostic Studies: Capsular endoscopy study showing AVM  Treatments: Transfusion  Discharge Exam: Blood pressure (!) 155/90, pulse 88, temperature 97.8 F (36.6 C), temperature source Axillary, resp. rate 20, height 6\' 3"  (1.905 m), weight 89.6 kg, SpO2 94 %. General appearance: alert, cooperative and no distress Head: Normocephalic, without obvious abnormality, atraumatic Resp: clear to auscultation bilaterally Cardio: regular rate and rhythm, S1, S2 normal, no murmur, click, rub or gallop GI: soft, non-tender; bowel sounds normal; no masses,  no organomegaly Extremities: extremities normal, atraumatic, no cyanosis or edema and Homans sign is negative, no sign of DVT Pulses: 2+ and symmetric  Disposition: Discharge disposition: 01-Home or Self Care       Discharge Instructions    Call MD for:  difficulty breathing, headache or visual disturbances   Complete by:  As directed    Call MD for:  extreme fatigue   Complete by:  As directed    Call MD for:  hives   Complete by:  As directed    Call MD for:  persistant dizziness or light-headedness   Complete by:  As directed    Call MD for:  persistant nausea and vomiting   Complete by:  As directed    Call MD for:  redness, tenderness, or signs of infection (pain, swelling, redness, odor or green/yellow discharge around incision site)   Complete by:  As directed    Call MD for:  severe uncontrolled pain   Complete by:  As directed    Call MD for:  temperature >100.4   Complete by:  As directed    Diet - low sodium heart healthy   Complete by:  As  directed    Increase activity slowly   Complete by:  As directed      Allergies as of 09/28/2018      Reactions   Lisinopril Cough      Medication List    TAKE these medications   albuterol 108 (90 Base) MCG/ACT inhaler Commonly known as:  VENTOLIN HFA Inhale 2 puffs into the lungs every 6 (six) hours as needed for wheezing or shortness of breath.   atorvastatin 80 MG tablet Commonly known as:  LIPITOR Take 1 tablet (80 mg total) by mouth daily.   carvedilol 3.125 MG tablet Commonly known as:  Coreg Take 1 tablet (3.125 mg total) by mouth 2 (two) times daily.   cetirizine 10 MG tablet Commonly known as:  ZYRTEC Take 1 tablet (10 mg total) by mouth daily.   ferrous sulfate 325 (65 FE) MG tablet Take 1 tablet (325 mg total) by mouth daily with breakfast.   fluticasone furoate-vilanterol 100-25 MCG/INH Aepb Commonly known as:  Breo Ellipta Inhale 1 puff into the lungs daily.   furosemide 40 MG tablet Commonly known as:  Lasix Take 1 tablet (40 mg total) by mouth daily.   gabapentin 300 MG capsule Commonly known as:  NEURONTIN Take 1 capsule (300 mg total) by mouth at bedtime.   ipratropium-albuterol 0.5-2.5 (3) MG/3ML Soln Commonly known as:  DUONEB Take 3 mLs by nebulization every 6 (six) hours as needed.   losartan 50 MG tablet Commonly known as:  COZAAR Take 1 tablet (50 mg total) by mouth daily.   pantoprazole 40 MG tablet Commonly known as:  PROTONIX Take 1 tablet (40 mg total) by mouth daily at 6 (six) AM.   rivaroxaban 20 MG Tabs tablet Commonly known as:  XARELTO Take 20 mg by mouth daily with supper.      Follow-up Information    [name], MD. Schedule an appointment as soon as possible for a visit in 2 week(s).   Specialty:  Family Medicine Contact information: [address] [place] [address] [phone]        [facility]. Schedule an appointment as soon as possible for a visit in 2 week(s).   Specialty:   Gastroenterology Contact information: [address] [place] [address] [phone]         33 minutes spent in discharge of this patient  Signed: Levie Heritage 09/28/2018, 12:53 PM

## 2018-09-28 NOTE — Anesthesia Postprocedure Evaluation (Signed)
Anesthesia Post Note  Patient: Jared [name]  Procedure(s) Performed: ENTEROSCOPY (N/A )     Patient location during evaluation: PACU Anesthesia Type: General Level of consciousness: awake and alert Pain management: pain level controlled Vital Signs Assessment: post-procedure vital signs reviewed and stable Respiratory status: spontaneous breathing, nonlabored ventilation, respiratory function stable and patient connected to nasal cannula oxygen Cardiovascular status: blood pressure returned to baseline and stable Postop Assessment: no apparent nausea or vomiting Anesthetic complications: no    Last Vitals:  Vitals:   09/28/18 0910 09/28/18 0920  BP:    Pulse: (!) 106 95  Resp: 17 20  Temp:    SpO2: 100% 94%    Last Pain:  Vitals:   09/28/18 0920  TempSrc:   PainSc: 0-No pain                 [name]

## 2018-09-29 ENCOUNTER — Telehealth: Payer: Self-pay

## 2018-09-29 NOTE — Telephone Encounter (Signed)
Patient's sister picked up the duoneb and nebulizer for patient

## 2018-09-29 NOTE — Telephone Encounter (Signed)
Transition Care Management Follow-up Telephone Call  Date of discharge and from where: 09/28/2018, [facility].   How have you been since you were released from the hospital? He stated that he is " feeling all right."  He denied any dizziness, light headedness, bleeding.   Any questions or concerns? No, none at this time.   Items Reviewed:  Did the pt receive and understand the discharge instructions provided? He said that he has the instructions and has no questions.   Medications obtained and verified? He said that he has all medications except the duoneb and he plans to pick that up this afternoon. He also noted that he plans to come to [facility] to pick up a nebulizer as he does not have one. He had no questions about his medications and said that he has been taking them as ordered. He did not want to review the medication list.   Any new allergies since your discharge? None reported  Do you have support at home? Lives in boarding house. His sister provides assistance when she is able.   Other (ie: DME, Home Health, etc) no home health services. He does not have a scale.  Has O2 from [employer].  He said that he is using it at 3l continuously but sometimes needs to increase to 4L/min.  Functional Questionnaire: (I = Independent and D = Dependent) ADL's: independent   Follow up appointments reviewed:    PCP Hospital f/u appt confirmed? 09/30/2018 @ 1530 - televisit with Dr Alvis Lemmings.  He said that someone from the clinic has already called him and confirmed.   Specialist Hospital f/u appt confirmed?   needs to schedule an appointment with [facility].   Transportation needed? no  If their condition worsens, is the pt aware to call  their PCP or go to the ED? yes  Was the patient provided with contact information for the PCP's office or ED? He has the phone number for [facility]  Was the pt encouraged to call back with questions or concerns? yes

## 2018-09-29 NOTE — Telephone Encounter (Signed)
Dr Alvis Lemmings - from the discharge call:  He has a televisit scheduled for tomorrow  - 09/30/2018.     He said that he has all medications except the duoneb and he plans to pick that up this afternoon. He also noted that he plans to come to [facility] to pick up a nebulizer as he does not have one.  Has O2 from [employer].  He said that he is using it at 3L continuously but sometimes needs to increase to 4L/min.

## 2018-09-29 NOTE — Telephone Encounter (Signed)
Patient's sister, [name], picked up nebulizer for patient.

## 2018-09-30 ENCOUNTER — Ambulatory Visit: Payer: Medicaid Other | Attending: Family Medicine | Admitting: Family Medicine

## 2018-09-30 ENCOUNTER — Encounter: Payer: Self-pay | Admitting: Family Medicine

## 2018-09-30 ENCOUNTER — Other Ambulatory Visit: Payer: Self-pay

## 2018-09-30 DIAGNOSIS — K5520 Angiodysplasia of colon without hemorrhage: Secondary | ICD-10-CM | POA: Diagnosis not present

## 2018-09-30 DIAGNOSIS — I5042 Chronic combined systolic (congestive) and diastolic (congestive) heart failure: Secondary | ICD-10-CM | POA: Diagnosis not present

## 2018-09-30 DIAGNOSIS — J9611 Chronic respiratory failure with hypoxia: Secondary | ICD-10-CM | POA: Diagnosis not present

## 2018-09-30 DIAGNOSIS — I4892 Unspecified atrial flutter: Secondary | ICD-10-CM | POA: Diagnosis not present

## 2018-09-30 NOTE — Progress Notes (Signed)
Virtual Visit via Telephone Note  I connected with Jared [name], on 09/30/2018 at 3:39 PM by telephone and verified that I am speaking with the correct person using two identifiers.   Consent: I discussed the limitations, risks, security and privacy concerns of performing an evaluation and management service by telephone and the availability of in person appointments. I also discussed with the patient that there may be a patient responsible charge related to this service. The patient expressed understanding and agreed to proceed.   Location of Patient: Patient's home  Location of Provider: Clinic   Persons participating in Telemedicine visit: Sevag Jearld Lesch Farrington-CMA Dr. Nelwyn [facility]     History of Present Illness: Jared [name] is a 63 year old male with a history of hypertension, COPD, tobacco abuse, NICM , CHF (EF 45-50% from echo 04/2018  improved from  30-35% in 09/2016), atrial flutter, chronic respiratory failure with hypoxia (currently on 3 L of oxygen) here for a follow-up visit. He had a recent hospitalization for GI bleed from 09/25/2018 through 09/28/2018 and prior to that from 09/04/2018 through 09/07/2018.  During his hospitalization in March he received 4 units of PRBC and underwent EGD and colonoscopy.EGD revealed hiatal hernia, mild esophagitis and Protonix was commenced with plans for repeat EGD as an outpatient..  Colonoscopy revealed small internal hemorrhoids. Hemoglobin improved to 8.0 at discharge. A follow-up CBC I had ordered 2 weeks after returned at 6.9 for which I referred him to the ED again. He was transfused, small bowel enteroscopy revealed normal esophagus, normal stoma, no suspicious lesions or AVMs.  Capsule endoscopy revealed proximal AV malformation.  Discharge hemoglobin was 8.9 and he was maintained on Xarelto.  Today he reports doing well and denies melena, hematochezia, hematemesis and is on his Xarelto. He currently uses 3 L of oxygen at  rest and 4 L on exertion.  He does have dyspnea which is at his baseline.  Denies pedal edema, chest pains and has no additional concerns today.  Past Medical History:  Diagnosis Date  . CAD (coronary artery disease)    a. LHC 5/12:  LAD 20, pLCx 20, pRCA 40, dRCA 40, EF 35%, diff HK  //  b. Myoview 4/16: Overall Impression: High risk stress nuclear study There is no evidence of ischemia. There is severe LV dysfunction. LV Ejection Fraction: 30%. LV Wall Motion: There is global LV hypokinesis.   Marland Kitchen CAP (community acquired pneumonia) 09/2013  . Chronic combined systolic and diastolic CHF (congestive heart failure) (HCC)    a. Echo 4/16:Mild LVH, EF 40-45%, diffuse HK //  b. Echo 8/17: EF 35-40%, diffuse HK, diastolic dysfunction, aortic sclerosis, trivial MR, moderate LAE, normal RVSF, moderate RAE, mild TR, PASP 42 mmHg // c. Echo 4/18: Mild concentric LVH, EF 30-35, normal wall motion, grade 1 diastolic dysfunction, PASP 49  . Cluster headache    "hx; haven't had one in awhile" (01/09/2016)  . COPD (chronic obstructive pulmonary disease) (HCC)    [name] 01/09/2016  . History of CVA (cerebrovascular accident)   . Hypertension   . NICM (nonischemic cardiomyopathy) (HCC)   . Tobacco abuse    Allergies  Allergen Reactions  . Lisinopril Cough    Current Outpatient Medications on File Prior to Visit  Medication Sig Dispense Refill  . albuterol (PROVENTIL HFA;VENTOLIN HFA) 108 (90 Base) MCG/ACT inhaler Inhale 2 puffs into the lungs every 6 (six) hours as needed for wheezing or shortness of breath. 1 Inhaler 5  . atorvastatin (LIPITOR) 80 MG tablet Take  1 tablet (80 mg total) by mouth daily. 30 tablet 0  . carvedilol (COREG) 3.125 MG tablet Take 1 tablet (3.125 mg total) by mouth 2 (two) times daily. 60 tablet 6  . cetirizine (ZYRTEC) 10 MG tablet Take 1 tablet (10 mg total) by mouth daily. 30 tablet 1  . ferrous sulfate 325 (65 FE) MG tablet Take 1 tablet (325 mg total) by mouth daily with  breakfast. 60 tablet 1  . fluticasone furoate-vilanterol (BREO ELLIPTA) 100-25 MCG/INH AEPB Inhale 1 puff into the lungs daily. 60 each 6  . furosemide (LASIX) 40 MG tablet Take 1 tablet (40 mg total) by mouth daily. 30 tablet 3  . gabapentin (NEURONTIN) 300 MG capsule Take 1 capsule (300 mg total) by mouth at bedtime. 30 capsule 3  . losartan (COZAAR) 50 MG tablet Take 1 tablet (50 mg total) by mouth daily. 30 tablet 6  . pantoprazole (PROTONIX) 40 MG tablet Take 1 tablet (40 mg total) by mouth daily at 6 (six) AM. 30 tablet 0  . rivaroxaban (XARELTO) 20 MG TABS tablet Take 20 mg by mouth daily with supper.    Marland Kitchen ipratropium-albuterol (DUONEB) 0.5-2.5 (3) MG/3ML SOLN Take 3 mLs by nebulization every 6 (six) hours as needed. (Patient not taking: Reported on 09/26/2018) 120 mL 3   No current facility-administered medications on file prior to visit.     Observations/Objective: Awake, alert, oriented x3 Not in acute distress  CMP Latest Ref Rng & Units 09/28/2018 09/26/2018 09/25/2018  Glucose 70 - 99 mg/dL 93 89 161(W)  BUN 8 - 23 mg/dL 5(L) 8 12  Creatinine 9.60 - 1.24 mg/dL 4.54 0.98 1.19  Sodium 135 - 145 mmol/L 141 143 144  Potassium 3.5 - 5.1 mmol/L 3.6 3.5 3.8  Chloride 98 - 111 mmol/L 90(L) 89(L) 95(L)  CO2 22 - 32 mmol/L 43(H) >50(H) 41(H)  Calcium 8.9 - 10.3 mg/dL 8.9 1.4(N) 8.2(N)  Total Protein 6.5 - 8.1 g/dL - - -  Total Bilirubin 0.3 - 1.2 mg/dL - - -  Alkaline Phos 38 - 126 U/L - - -  AST 15 - 41 U/L - - -  ALT 0 - 44 U/L - - -    CBC    Component Value Date/Time   WBC 8.2 09/28/2018 0509   RBC 3.26 (L) 09/28/2018 0509   HGB 8.3 (L) 09/28/2018 0509   HGB 6.8 (LL) 09/24/2018 1414   HCT 29.1 (L) 09/28/2018 0509   HCT 23.5 (L) 09/24/2018 1414   PLT 266 09/28/2018 0509   PLT 381 09/24/2018 1414   MCV 89.3 09/28/2018 0509   MCV 86 09/24/2018 1414   MCH 25.5 (L) 09/28/2018 0509   MCHC 28.5 (L) 09/28/2018 0509   RDW 16.0 (H) 09/28/2018 0509   RDW 15.6 (H) 09/24/2018  1414   LYMPHSABS 2.1 09/28/2018 0509   LYMPHSABS 3.4 (H) 09/24/2018 1414   MONOABS 0.9 09/28/2018 0509   EOSABS 0.2 09/28/2018 0509   EOSABS 0.1 09/24/2018 1414   BASOSABS 0.1 09/28/2018 0509   BASOSABS 0.1 09/24/2018 1414     Assessment and Plan: 1. Chronic respiratory failure with hypoxia (HCC) Secondary to COPD and CHF Currently on 3 L of oxygen at rest and 4 L with exertion  2. Atrial flutter, unspecified type (HCC) On anticoagulation with Xarelto Advised to keep appointment with cardiology  3. AV malformation of gastrointestinal tract With recurrent GI bleed -discharge hemoglobin was 8.9 No evidence of bleeding at this time Weighing risks and benefit of anticoagulation  due to A.flutter versus GI bleed We will repeat CBC in 2 weeks to monitor Continue ferrous sulfate Advised to keep appointment with GI - CBC with Differential/Platelet; Future  4. Chronic combined systolic and diastolic CHF (congestive heart failure) (HCC) EF 45 to 50% from echo of 04/2018 Euvolemic Continue current medications   Follow Up Instructions: 6 weeks   I discussed the assessment and treatment plan with the patient. The patient was provided an opportunity to ask questions and all were answered. The patient agreed with the plan and demonstrated an understanding of the instructions.   The patient was advised to call back or seek an in-person evaluation if the symptoms worsen or if the condition fails to improve as anticipated.     I provided 17 minutes total of non-face-to-face time during this encounter including median intraservice time, reviewing previous notes, labs, imaging, medications and explaining diagnosis and management.     [name], MD, FAAFP. [facility] [place], [place] [phone]   09/30/2018, 3:39 PM

## 2018-09-30 NOTE — Progress Notes (Signed)
Patient has been called and DOB has been verified. Patient has been screened and transferred to PCP to start phone visit.  C/C: HFU   

## 2018-10-07 ENCOUNTER — Other Ambulatory Visit: Payer: Self-pay

## 2018-10-07 ENCOUNTER — Ambulatory Visit (INDEPENDENT_AMBULATORY_CARE_PROVIDER_SITE_OTHER): Payer: Medicaid Other | Admitting: Gastroenterology

## 2018-10-07 ENCOUNTER — Encounter: Payer: Self-pay | Admitting: Gastroenterology

## 2018-10-07 VITALS — Ht 75.0 in | Wt 197.0 lb

## 2018-10-07 DIAGNOSIS — K5521 Angiodysplasia of colon with hemorrhage: Secondary | ICD-10-CM | POA: Diagnosis not present

## 2018-10-07 DIAGNOSIS — D509 Iron deficiency anemia, unspecified: Secondary | ICD-10-CM

## 2018-10-07 DIAGNOSIS — D500 Iron deficiency anemia secondary to blood loss (chronic): Secondary | ICD-10-CM | POA: Diagnosis not present

## 2018-10-07 DIAGNOSIS — Z7901 Long term (current) use of anticoagulants: Secondary | ICD-10-CM | POA: Diagnosis not present

## 2018-10-07 NOTE — Progress Notes (Signed)
This patient contacted our office requesting a physician telephone consultation regarding clinical questions and/or test results. (Patient does not have access to technology for video conference)  Participants on the call : myself and patient   The patient consented to phone consultation and was aware that a charge will be placed through their insurance.  I was in my office and the patient was at home   Encounter time:  Total time 22 minutes, all spent with patient on phone/webex , extensive chart review required re: events of recent hospitalization.  Amada Jupiter, MD   _____________________________________________________________________________________________    I last spoke to this patient on 09/09/2018 after he had been hospitalized for melena and severe anemia.  No cause found on EGD, colonoscopy or video capsule study.  I felt he most likely had an obscure source of small bowel bleeding exacerbated by anticoagulation therapy.  At the time of that follow-up phone visit on 09/09/2018, the patient was not taking any iron tablets, so I sent a prescription for iron sulfate with dosing instructions.  Primary care did a follow-up hemoglobin on him about 2 weeks ago, which returned at 6.9 and the patient was again referred to the ED.  He was admitted to the hospital, transfused, and small bowel video capsule procedure was done on 09/26/2018.  Dr. Rhea Belton noted and actively bleeding duodenum AVM, and the patient underwent enteroscopy on 09/28/2018.  Unfortunately, this did not localize the AVM.  Of note, the patient has severe COPD and CHF causing chronic respiratory failure with hypoxia requiring supplemental oxygen.  He is on Xarelto for a flutter.  He reports that his stool is black, but it has been for at least several weeks while he is taking iron.  He had not noticed any change in that when primary care recently sent him for this admission.  He denies abdominal pain, and is currently taking  iron sulfate 325 mg once daily.  He has chronic dyspnea denies chest pain.  His appetite is good, denies dysphagia, no vomiting, weight stable.  Did extensive chart review of these hospitalization records including consult note, follow-up notes, report of small bowel video capsule study and enteroscopy post discharge summary.  Reviewed most recent primary care notes as well.  CBC Latest Ref Rng & Units 09/28/2018 09/27/2018 09/26/2018  WBC 4.0 - 10.5 K/uL 8.2 9.0 -  Hemoglobin 13.0 - 17.0 g/dL 8.3(L) 8.3(L) 8.3(L)  Hematocrit 39.0 - 52.0 % 29.1(L) 28.2(L) 28.7(L)  Platelets 150 - 400 K/uL 266 273 -   Last CBC was done during hospitalization.   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  This is a very frustrating scenario since these lesions tend to be evanescent, and therefore difficult to localize when patient is off oral anticoagulation and endoscopy is performed.  The video capsule seem to show that, at least on that occasion, it was a convincing source in the duodenum.  We will make arrangements to check his CBC in the next couple of days.  I have can then decide if he needs repeat upper endoscopy/enteroscopy to try and localize the source of bleeding.  It would be a complex and increased risk procedure due to the patient's underlying medical condition, chronic respiratory failure, oral anticoagulation.   - Amada Jupiter, MD    [facility]

## 2018-10-14 ENCOUNTER — Other Ambulatory Visit: Payer: Medicaid Other

## 2018-10-27 ENCOUNTER — Emergency Department (HOSPITAL_COMMUNITY): Payer: Medicare HMO

## 2018-10-27 ENCOUNTER — Encounter (HOSPITAL_COMMUNITY): Payer: Self-pay | Admitting: *Deleted

## 2018-10-27 ENCOUNTER — Other Ambulatory Visit (INDEPENDENT_AMBULATORY_CARE_PROVIDER_SITE_OTHER): Payer: Medicare HMO

## 2018-10-27 ENCOUNTER — Telehealth: Payer: Self-pay | Admitting: General Surgery

## 2018-10-27 ENCOUNTER — Other Ambulatory Visit: Payer: Self-pay

## 2018-10-27 ENCOUNTER — Observation Stay (HOSPITAL_COMMUNITY)
Admission: EM | Admit: 2018-10-27 | Discharge: 2018-10-28 | Disposition: A | Discharge status: Home / Self Care | Payer: Medicare HMO | Attending: Internal Medicine | Admitting: Internal Medicine

## 2018-10-27 DIAGNOSIS — Z20828 Contact with and (suspected) exposure to other viral communicable diseases: Secondary | ICD-10-CM | POA: Diagnosis not present

## 2018-10-27 DIAGNOSIS — Z9981 Dependence on supplemental oxygen: Secondary | ICD-10-CM | POA: Diagnosis not present

## 2018-10-27 DIAGNOSIS — D509 Iron deficiency anemia, unspecified: Secondary | ICD-10-CM

## 2018-10-27 DIAGNOSIS — Z8673 Personal history of transient ischemic attack (TIA), and cerebral infarction without residual deficits: Secondary | ICD-10-CM | POA: Diagnosis not present

## 2018-10-27 DIAGNOSIS — K31819 Angiodysplasia of stomach and duodenum without bleeding: Secondary | ICD-10-CM | POA: Diagnosis not present

## 2018-10-27 DIAGNOSIS — J449 Chronic obstructive pulmonary disease, unspecified: Secondary | ICD-10-CM | POA: Diagnosis present

## 2018-10-27 DIAGNOSIS — D649 Anemia, unspecified: Secondary | ICD-10-CM | POA: Diagnosis present

## 2018-10-27 DIAGNOSIS — I2510 Atherosclerotic heart disease of native coronary artery without angina pectoris: Secondary | ICD-10-CM | POA: Diagnosis not present

## 2018-10-27 DIAGNOSIS — I5042 Chronic combined systolic (congestive) and diastolic (congestive) heart failure: Secondary | ICD-10-CM | POA: Insufficient documentation

## 2018-10-27 DIAGNOSIS — Z7901 Long term (current) use of anticoagulants: Secondary | ICD-10-CM | POA: Diagnosis not present

## 2018-10-27 DIAGNOSIS — I420 Dilated cardiomyopathy: Secondary | ICD-10-CM | POA: Insufficient documentation

## 2018-10-27 DIAGNOSIS — Z79899 Other long term (current) drug therapy: Secondary | ICD-10-CM | POA: Insufficient documentation

## 2018-10-27 DIAGNOSIS — D62 Acute posthemorrhagic anemia: Secondary | ICD-10-CM | POA: Insufficient documentation

## 2018-10-27 DIAGNOSIS — J432 Centrilobular emphysema: Secondary | ICD-10-CM | POA: Diagnosis not present

## 2018-10-27 DIAGNOSIS — I4892 Unspecified atrial flutter: Secondary | ICD-10-CM | POA: Diagnosis not present

## 2018-10-27 DIAGNOSIS — I110 Hypertensive heart disease with heart failure: Secondary | ICD-10-CM | POA: Diagnosis not present

## 2018-10-27 DIAGNOSIS — F17210 Nicotine dependence, cigarettes, uncomplicated: Secondary | ICD-10-CM | POA: Insufficient documentation

## 2018-10-27 DIAGNOSIS — K921 Melena: Principal | ICD-10-CM

## 2018-10-27 DIAGNOSIS — K5520 Angiodysplasia of colon without hemorrhage: Secondary | ICD-10-CM

## 2018-10-27 DIAGNOSIS — J9611 Chronic respiratory failure with hypoxia: Secondary | ICD-10-CM | POA: Insufficient documentation

## 2018-10-27 DIAGNOSIS — I10 Essential (primary) hypertension: Secondary | ICD-10-CM | POA: Diagnosis present

## 2018-10-27 DIAGNOSIS — I5043 Acute on chronic combined systolic (congestive) and diastolic (congestive) heart failure: Secondary | ICD-10-CM | POA: Diagnosis present

## 2018-10-27 DIAGNOSIS — K922 Gastrointestinal hemorrhage, unspecified: Secondary | ICD-10-CM | POA: Diagnosis present

## 2018-10-27 LAB — CBC WITH DIFFERENTIAL/PLATELET
Abs Immature Granulocytes: 0.05 10*3/uL (ref 0.00–0.07)
Basophils Absolute: 0.1 10*3/uL (ref 0.0–0.1)
Basophils Absolute: 0 10*3/uL (ref 0.0–0.1)
Basophils Relative: 0.8 % (ref 0.0–3.0)
Basophils Relative: 0 %
Eosinophils Absolute: 0.1 10*3/uL (ref 0.0–0.7)
Eosinophils Absolute: 0.7 10*3/uL — ABNORMAL HIGH (ref 0.0–0.5)
Eosinophils Relative: 1 % (ref 0.0–5.0)
Eosinophils Relative: 6 %
HCT: 25.9 % — ABNORMAL LOW (ref 39.0–52.0)
HCT: 28.2 % — ABNORMAL LOW (ref 39.0–52.0)
Hemoglobin: 7.7 g/dL — CL (ref 13.0–17.0)
Hemoglobin: 7.4 g/dL — ABNORMAL LOW (ref 13.0–17.0)
Immature Granulocytes: 0 %
Lymphocytes Relative: 17 % (ref 12.0–46.0)
Lymphocytes Relative: 19 %
Lymphs Abs: 1.4 10*3/uL (ref 0.7–4.0)
Lymphs Abs: 2.2 10*3/uL (ref 0.7–4.0)
MCH: 25.1 pg — ABNORMAL LOW (ref 26.0–34.0)
MCHC: 29.9 g/dL — ABNORMAL LOW (ref 30.0–36.0)
MCHC: 26.2 g/dL — ABNORMAL LOW (ref 30.0–36.0)
MCV: 87.2 fl (ref 78.0–100.0)
MCV: 95.6 fL (ref 80.0–100.0)
Monocytes Absolute: 0.7 10*3/uL (ref 0.1–1.0)
Monocytes Absolute: 1.3 10*3/uL — ABNORMAL HIGH (ref 0.1–1.0)
Monocytes Relative: 8.5 % (ref 3.0–12.0)
Monocytes Relative: 11 %
Neutro Abs: 6.1 10*3/uL (ref 1.4–7.7)
Neutro Abs: 7 10*3/uL (ref 1.7–7.7)
Neutrophils Relative %: 72.7 % (ref 43.0–77.0)
Neutrophils Relative %: 64 %
Platelets: 247 10*3/uL (ref 150.0–400.0)
Platelets: 253 10*3/uL (ref 150–400)
RBC: 2.97 Mil/uL — ABNORMAL LOW (ref 4.22–5.81)
RBC: 2.95 MIL/uL — ABNORMAL LOW (ref 4.22–5.81)
RDW: 17.3 % — ABNORMAL HIGH (ref 11.5–15.5)
RDW: 15.5 % (ref 11.5–15.5)
WBC: 8.3 10*3/uL (ref 4.0–10.5)
WBC: 11.2 10*3/uL — ABNORMAL HIGH (ref 4.0–10.5)
nRBC: 0 % (ref 0.0–0.2)

## 2018-10-27 LAB — COMPREHENSIVE METABOLIC PANEL
ALT: 15 U/L (ref 0–44)
AST: 25 U/L (ref 15–41)
Albumin: 3.7 g/dL (ref 3.5–5.0)
Alkaline Phosphatase: 62 U/L (ref 38–126)
Anion gap: 9 (ref 5–15)
BUN: 13 mg/dL (ref 8–23)
CO2: 42 mmol/L — ABNORMAL HIGH (ref 22–32)
Calcium: 8.6 mg/dL — ABNORMAL LOW (ref 8.9–10.3)
Chloride: 93 mmol/L — ABNORMAL LOW (ref 98–111)
Creatinine, Ser: 0.95 mg/dL (ref 0.61–1.24)
GFR calc Af Amer: >60 mL/min (ref >60)
GFR calc non Af Amer: >60 mL/min (ref >60)
Glucose, Bld: 94 mg/dL (ref 70–99)
Potassium: 4.1 mmol/L (ref 3.5–5.1)
Sodium: 144 mmol/L (ref 135–145)
Total Bilirubin: 0.4 mg/dL (ref 0.3–1.2)
Total Protein: 6.8 g/dL (ref 6.5–8.1)

## 2018-10-27 LAB — SARS CORONAVIRUS 2 BY RT PCR (HOSPITAL ORDER, PERFORMED IN ~~LOC~~ HOSPITAL LAB): SARS Coronavirus 2: NEGATIVE

## 2018-10-27 LAB — PROTIME-INR
INR: 1.3 — ABNORMAL HIGH (ref 0.8–1.2)
Prothrombin Time: 16.1 seconds — ABNORMAL HIGH (ref 11.4–15.2)

## 2018-10-27 LAB — TROPONIN I: Troponin I: <0.03 ng/mL (ref <0.03)

## 2018-10-27 LAB — POC OCCULT BLOOD, ED: Fecal Occult Bld: POSITIVE — AB

## 2018-10-27 LAB — PREPARE RBC (CROSSMATCH)

## 2018-10-27 MED ORDER — FUROSEMIDE 40 MG PO TABS
40.0000 mg | ORAL_TABLET | Freq: Every day | ORAL | Status: DC
  Administered 2018-10-28: 40 mg via ORAL
  Filled 2018-10-27: qty 1

## 2018-10-27 MED ORDER — IPRATROPIUM-ALBUTEROL 0.5-2.5 (3) MG/3ML IN SOLN: 3.0000 mL | Freq: Four times a day (QID) | RESPIRATORY_TRACT | Status: DC | PRN

## 2018-10-27 MED ORDER — ACETAMINOPHEN 325 MG PO TABS
650.0000 mg | ORAL_TABLET | Freq: Four times a day (QID) | ORAL | Status: DC | PRN
  Administered 2018-10-28: 650 mg via ORAL
  Filled 2018-10-27: qty 2

## 2018-10-27 MED ORDER — PANTOPRAZOLE SODIUM 40 MG IV SOLR
40.0000 mg | Freq: Two times a day (BID) | INTRAVENOUS | Status: DC
  Administered 2018-10-28: 40 mg via INTRAVENOUS
  Filled 2018-10-27 (×2): qty 40

## 2018-10-27 MED ORDER — ATORVASTATIN CALCIUM 80 MG PO TABS
80.0000 mg | ORAL_TABLET | Freq: Every day | ORAL | Status: DC
  Administered 2018-10-28: 80 mg via ORAL
  Filled 2018-10-27: qty 1

## 2018-10-27 MED ORDER — SODIUM CHLORIDE 0.9 % IV SOLN
10.0000 mL/h | Freq: Once | INTRAVENOUS | Status: AC
  Administered 2018-10-28: 10 mL/h via INTRAVENOUS

## 2018-10-27 MED ORDER — CARVEDILOL 3.125 MG PO TABS
3.1250 mg | ORAL_TABLET | Freq: Two times a day (BID) | ORAL | Status: DC
  Administered 2018-10-27 – 2018-10-28 (×2): 3.125 mg via ORAL
  Filled 2018-10-27 (×3): qty 1

## 2018-10-27 MED ORDER — ALBUTEROL SULFATE (2.5 MG/3ML) 0.083% IN NEBU: 2.5000 mg | INHALATION_SOLUTION | Freq: Four times a day (QID) | RESPIRATORY_TRACT | Status: DC | PRN

## 2018-10-27 MED ORDER — FLUTICASONE FUROATE-VILANTEROL 100-25 MCG/INH IN AEPB
1.0000 | INHALATION_SPRAY | Freq: Every day | RESPIRATORY_TRACT | Status: DC
  Administered 2018-10-28: 1 via RESPIRATORY_TRACT
  Filled 2018-10-27: qty 28

## 2018-10-27 MED ORDER — ALBUTEROL SULFATE HFA 108 (90 BASE) MCG/ACT IN AERS
1.0000 | INHALATION_SPRAY | Freq: Once | RESPIRATORY_TRACT | Status: AC
  Administered 2018-10-27: 2 via RESPIRATORY_TRACT
  Filled 2018-10-27: qty 6.7

## 2018-10-27 MED ORDER — SODIUM CHLORIDE 0.9% FLUSH
3.0000 mL | Freq: Two times a day (BID) | INTRAVENOUS | Status: DC
  Administered 2018-10-27 – 2018-10-28 (×2): 3 mL via INTRAVENOUS

## 2018-10-27 MED ORDER — PANTOPRAZOLE SODIUM 40 MG IV SOLR
40.0000 mg | Freq: Once | INTRAVENOUS | Status: AC
  Administered 2018-10-27: 40 mg via INTRAVENOUS
  Filled 2018-10-27: qty 40

## 2018-10-27 MED ORDER — ACETAMINOPHEN 650 MG RE SUPP: 650.0000 mg | Freq: Four times a day (QID) | RECTAL | Status: DC | PRN

## 2018-10-27 MED ORDER — LOSARTAN POTASSIUM 50 MG PO TABS
50.0000 mg | ORAL_TABLET | Freq: Every day | ORAL | Status: DC
  Administered 2018-10-28: 50 mg via ORAL
  Filled 2018-10-27: qty 1

## 2018-10-27 NOTE — Telephone Encounter (Signed)
Thank you for the information.

## 2018-10-27 NOTE — Telephone Encounter (Signed)
Also sent to Dr Myrtie Neither

## 2018-10-27 NOTE — Telephone Encounter (Signed)
Jared [name],    This patient is coming to ED for anemia, and I feel he needs inpatient management due to complexity of his multiple medical issues.    My recent phone visit note with patient has a summary of his condition and recent inpatient workup.  Apparent duodenal AVM bleeding intermittently.  Needs EGD/enteroscopy when off xarelto at least a day.  Transfusion would be optimal, if current transfusion parameters and PRBC supply allow.  Feel free to call me with questions.  - HD

## 2018-10-27 NOTE — ED Triage Notes (Signed)
Pt sent over by PCP related to hgb 7.8, pt denies pain, increased shortness of breath, pt on baseline O2, no distress noted

## 2018-10-27 NOTE — Progress Notes (Addendum)
Patient arrived to unit with one unit of blood running.  VSS.  Patient placed on telemetry.  Call bell in place.

## 2018-10-27 NOTE — Telephone Encounter (Signed)
This patient has had chronic small bowel GI bleeding and recent admission for same.  Hemoglobin dropping despite iron replacement, so concerning for ongoing bleeding.  He also has multiple complex medical conditions including congestive heart failure and COPD requiring supplemental oxygen.  So this hemoglobin level is dangerous for him.  Please contact this patient and have him proceed to the [facility] Emergency Department.  I think he will likely need transfusion and hospital admission for a repeat small bowel enteroscopy.  Instruct him not to take his Xarelto this afternoon/evening in case he needs to have an endoscopic procedure tomorrow. Let the ED triage desk know he is coming.  I will alert our hospital consult doctor.

## 2018-10-27 NOTE — Telephone Encounter (Signed)
Received critical lab from [place], Hgb 7.7. sent critical to Selinda Michaels, RN

## 2018-10-27 NOTE — Telephone Encounter (Signed)
Jared intake coordinator at [name] Jared [name]. Notified Patient verbalized understanding that he should hold Xarelto

## 2018-10-27 NOTE — Telephone Encounter (Signed)
Patient notified to go to the ED.

## 2018-10-27 NOTE — H&P (Signed)
History and Physical    Jared DageJohnny [name] ZHY:[id number] DOB: 11/27/1955 DOA: 10/27/2018  PCP: Hoy RegisterNewlin, Enobong, MD  Patient coming from: Home  I have personally briefly reviewed patient's [facility]  Chief Complaint: Melena  HPI: Jared [name] is a 63 y.o. 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  Patient states that he has noticed dark black appearing stool for about 1 month now.  He reports having 2-3 bowel movements per day.  He is on Xarelto for history of atrial flutter and last took it night of 10/26/2018.  He reports intermittent mild epigastric abdominal pain.  He reports chronic shortness of breath which is unchanged.  He reports becoming lightheaded after walking short distances without syncope or fall.  He denies any nausea, vomiting, hemoptysis, hematemesis, or hematuria.  He has not had any recent chest pain, palpitations, fevers, or peripheral edema.  He was recently admitted from 09/25/2018-09/28/2018 for symptomatic anemia and melena.  A capsule study was performed showing a duodenal AVM.  Active bleeding was noted on upper endoscopy on 09/30/2018.  He is stable hemoglobin, patient was discharged with recommendation to continue Xarelto.   ED Course:  Showed BP 100 1235, pulse 93, RR 20, temp 98.7 Fahrenheit, SPO2 100% on home 3 L supplemental O2 via [place].  Labs are notable for WBC 11.2, hemoglobin 7.4, platelets 253,000, BUN 13, creatinine 0.95, INR 1.3, negative troponin I.  FOBT was positive.  SARS-CoV-2 test was negative.  Portable chest x-ray was without acute cardiopulmonary process.  EDP discussed the case with on-call GI who recommended n.p.o. after midnight.  Patient was  given IV Protonix 40 mg once and ordered to have transfusion of 2 units PRBCs.  The hospitalist service was consulted to admit for further evaluation and management.   Review of Systems: All systems reviewed and are negative except as documented in history of present illness above.   Past Medical History:  Diagnosis Date   CAD (coronary artery disease)    a. LHC 5/12:  LAD 20, pLCx 20, pRCA 40, dRCA 40, EF 35%, diff HK  //  b. Myoview 4/16: Overall Impression: High risk stress nuclear study There is no evidence of ischemia. There is severe LV dysfunction. LV Ejection Fraction: 30%. LV Wall Motion: There is global LV hypokinesis.    CAP (community acquired pneumonia) 09/2013   Chronic combined systolic and diastolic CHF (congestive heart failure) (HCC)    a. Echo 4/16:Mild LVH, EF 40-45%, diffuse HK //  b. Echo 8/17: EF 35-40%, diffuse HK, diastolic dysfunction, aortic sclerosis, trivial MR, moderate LAE, normal RVSF, moderate RAE, mild TR, PASP 42 mmHg // c. Echo 4/18: Mild concentric LVH, EF 30-35, normal wall motion, grade 1 diastolic dysfunction, PASP 49   Cluster headache    "hx; haven't had one in awhile" (01/09/2016)   COPD (chronic obstructive pulmonary disease) (HCC)    [name]/notes 01/09/2016   History of CVA (cerebrovascular accident)    Hypertension    NICM (nonischemic cardiomyopathy) (HCC)    Tobacco abuse     Past Surgical History:  Procedure Laterality Date   CARDIAC CATHETERIZATION  10/2010   LM normal, LAD with 20% irregularities, LCX with 20%, RCA with 40%  prox and 40% distal - EF of 35%   COLONOSCOPY W/ BIOPSIES AND POLYPECTOMY     COLONOSCOPY WITH PROPOFOL N/A 09/06/2018   Procedure: COLONOSCOPY WITH PROPOFOL;  Surgeon: Tressia Danas, MD;  Location: [facility] ENDOSCOPY;  Service: Gastroenterology;  Laterality: N/A;   ENTEROSCOPY N/A 09/28/2018   Procedure: ENTEROSCOPY;  Surgeon: Jeani Hawking, MD;  Location: [facility] ENDOSCOPY;  Service: Endoscopy;  Laterality: N/A;     ESOPHAGOGASTRODUODENOSCOPY (EGD) WITH PROPOFOL N/A 09/05/2018   Procedure: ESOPHAGOGASTRODUODENOSCOPY (EGD) WITH PROPOFOL;  Surgeon: Benancio Deeds, MD;  Location: [facility] ENDOSCOPY;  Service: Gastroenterology;  Laterality: N/A;   EXCISION MASS HEAD     GIVENS CAPSULE STUDY N/A 09/06/2018   Procedure: GIVENS CAPSULE STUDY;  Surgeon: Tressia Danas, MD;  Location: [facility] ENDOSCOPY;  Service: Gastroenterology;  Laterality: N/A;   GIVENS CAPSULE STUDY N/A 09/26/2018   Procedure: GIVENS CAPSULE STUDY;  Surgeon: Beverley Fiedler, MD;  Location: [facility] ENDOSCOPY;  Service: Gastroenterology;  Laterality: N/A;   INCISION AND DRAINAGE PERIRECTAL ABSCESS N/A 06/05/2017   Procedure: IRRIGATION AND DEBRIDEMENT PERIRECTAL ABSCESS;  Surgeon: Andria Meuse, MD;  Location: [facility] OR;  Service: General;  Laterality: N/A;   VIDEO BRONCHOSCOPY Bilateral 05/08/2016   Procedure: VIDEO BRONCHOSCOPY WITH FLUORO;  Surgeon: Oretha Milch, MD;  Location: [facility] ENDOSCOPY;  Service: Cardiopulmonary;  Laterality: Bilateral;    Social History:  reports that he has been smoking cigarettes. He has a 10.50 pack-year smoking history. He has never used smokeless tobacco. He reports current alcohol use. He reports that he does not use drugs.  Allergies  Allergen Reactions   Lisinopril Cough    Family History  Problem Relation Age of Onset   Heart disease Mother    Diabetes Mother    Colon cancer Mother    Liver cancer Mother    Cancer Father        type unknown   Diabetes Sister        x 2   Diabetes Brother      Prior to Admission medications   Medication Sig Start Date End Date Taking? Authorizing Provider  albuterol (PROVENTIL HFA;VENTOLIN HFA) 108 (90 Base) MCG/ACT inhaler Inhale 2 puffs into the lungs every 6 (six) hours as needed for wheezing or shortness of breath. 05/15/18  Yes [name], MD  atorvastatin (LIPITOR) 80 MG tablet Take 1 tablet (80 mg total) by mouth daily. 09/06/18  Yes Tressia Danas, MD  carvedilol (COREG) 3.125 MG tablet Take 1 tablet (3.125 mg total) by mouth 2 (two) times daily. 05/15/18 05/15/19 Yes [name], MD  cetirizine (ZYRTEC) 10 MG tablet Take 1 tablet (10 mg total) by mouth daily. 06/18/18  Yes [name], MD  ferrous sulfate 325 (65 FE) MG tablet Take 1 tablet (325 mg total) by mouth daily with breakfast. 09/09/18  Yes Danis, Starr [place] III, MD  fluticasone furoate-vilanterol (BREO ELLIPTA) 100-25 MCG/INH AEPB Inhale 1 puff into the lungs daily. 05/15/18  Yes [name], MD  furosemide (LASIX) 40 MG tablet Take 1 tablet (40 mg total) by mouth daily. 05/16/18 05/16/19 Yes [name], MD  gabapentin (NEURONTIN) 300 MG capsule Take 1 capsule (300 mg total) by mouth at bedtime. 06/18/18  Yes [name], Enobong, MD  ipratropium-albuterol (DUONEB) 0.5-2.5 (3) MG/3ML SOLN Take 3 mLs by nebulization every 6 (six) hours as needed. Patient taking differently: Take 3 mLs by nebulization every 6 (six) hours as needed (SOB).  09/25/18  Yes [name], MD  losartan (COZAAR) 50 MG tablet Take 1  tablet (50 mg total) by mouth daily. 05/15/18  Yes [name], MD  pantoprazole (PROTONIX) 40 MG tablet Take 1 tablet (40 mg total) by mouth daily at 6 (six) AM. 09/07/18  Yes Glade Lloyd, MD  rivaroxaban (XARELTO) 20 MG TABS tablet Take 20 mg by mouth daily with supper.   Yes [provider]    Physical Exam: Vitals:   10/27/18 1630 10/27/18 1645 10/27/18 1906 10/27/18 1930  BP: 116/73 108/74 120/78 127/71  Pulse:   100 89  Resp: 19 20 (!) 25 (!) 24  Temp:   98.7 F (37.1 C)   TempSrc:   Oral   SpO2:   100% 100%    Constitutional: Elderly man sitting up in bed, NAD, calm, comfortable Eyes: PERRL, lids and conjunctivae normal ENMT: Mucous membranes are moist. Posterior pharynx clear of any exudate or lesions. Neck: normal, supple, no masses. Respiratory: clear to auscultation bilaterally, no wheezing, no crackles. Normal respiratory effort. No  accessory muscle use.  Cardiovascular: Regular rate and rhythm, no murmurs / rubs / gallops. No extremity edema. 2+ pedal pulses. Abdomen: no tenderness, no masses palpated. No hepatosplenomegaly. Bowel sounds positive.  Musculoskeletal: no clubbing / cyanosis. No joint deformity upper and lower extremities. Good ROM, no contractures. Normal muscle tone.  Skin: no rashes, lesions, ulcers. No induration.  Cap refill less than 2 seconds. Neurologic: CN 2-12 grossly intact. Sensation intact, Strength 5/5 in all 4.  Psychiatric: Normal judgment and insight. Alert and oriented x 3. Normal mood.     Labs on Admission: I have personally reviewed following labs and imaging studies  CBC: Recent Labs  Lab 10/27/18 0911 10/27/18 1635  WBC 8.3 11.2*  NEUTROABS 6.1 7.0  HGB 7.7 Repeated and verified X2.* 7.4*  HCT 25.9 Repeated and verified X2.* 28.2*  MCV 87.2 95.6  PLT 247.0 253   Basic Metabolic Panel: Recent Labs  Lab 10/27/18 1635  NA 144  K 4.1  CL 93*  CO2 42*  GLUCOSE 94  BUN 13  CREATININE 0.95  CALCIUM 8.6*   GFR: CrCl cannot be calculated (Unknown ideal weight.). Liver Function Tests: Recent Labs  Lab 10/27/18 1635  AST 25  ALT 15  ALKPHOS 62  BILITOT 0.4  PROT 6.8  ALBUMIN 3.7   No results for input(s): LIPASE, AMYLASE in the last 168 hours. No results for input(s): AMMONIA in the last 168 hours. Coagulation Profile: Recent Labs  Lab 10/27/18 1635  INR 1.3*   Cardiac Enzymes: Recent Labs  Lab 10/27/18 1635  TROPONINI <0.03   BNP (last 3 results) No results for input(s): PROBNP in the last 8760 hours. HbA1C: No results for input(s): HGBA1C in the last 72 hours. CBG: No results for input(s): GLUCAP in the last 168 hours. Lipid Profile: No results for input(s): CHOL, HDL, LDLCALC, TRIG, CHOLHDL, LDLDIRECT in the last 72 hours. Thyroid Function Tests: No results for input(s): TSH, T4TOTAL, FREET4, T3FREE, THYROIDAB in the last 72 hours. Anemia  Panel: No results for input(s): VITAMINB12, FOLATE, FERRITIN, TIBC, IRON, RETICCTPCT in the last 72 hours. Urine analysis:    Component Value Date/Time   COLORURINE YELLOW 07/25/2016 1845   APPEARANCEUR HAZY (A) 07/25/2016 1845   LABSPEC 1.017 07/25/2016 1845   PHURINE 5.0 07/25/2016 1845   GLUCOSEU 50 (A) 07/25/2016 1845   HGBUR SMALL (A) 07/25/2016 1845   BILIRUBINUR NEGATIVE 07/25/2016 1845   KETONESUR NEGATIVE 07/25/2016 1845   PROTEINUR 100 (A) 07/25/2016 1845   NITRITE NEGATIVE 07/25/2016 1845  LEUKOCYTESUR NEGATIVE 07/25/2016 1845    Radiological Exams on Admission: Dg Chest Portable 1 View  Result Date: 10/27/2018 CLINICAL DATA:  Shortness of breath EXAM: PORTABLE CHEST 1 VIEW COMPARISON:  09/25/2018 FINDINGS: The cardiac size is increased. There is no pneumothorax. No large pleural effusion, however the costophrenic angles are not fully visualized bilaterally. Old left-sided rib fractures are noted. The lungs are somewhat hyperexpanded which can be seen in patients with COPD. Aortic calcifications are noted. IMPRESSION: 1. No acute cardiopulmonary process identified. 2. Cardiomegaly. 3. Mildly hyperexpanded lungs which can be seen in patients with COPD. Electronically Signed   By: Katherine Mantle M.D.   On: 10/27/2018 16:46    EKG: Independently reviewed. Sinus rhythm with PACs, LAFB.  PACs more frequent compared to prior.  Assessment/Plan Principal Problem:   GI bleed Active Problems:   Essential hypertension   CAD (coronary artery disease)   COPD GOLD III/still smoking    Chronic combined systolic and diastolic CHF (congestive heart failure) (HCC)   Atrial flutter (HCC)   Symptomatic anemia   AV malformation of gastrointestinal tract   Mohamedamin Daft is a 63 y.o. male with medical history significant for CAD, chronic combined systolic and diastolic CHF EF 16-10%, COPD on 3 L supplemental O2 via [place], atrial flutter on Xarelto, chronic small bowel GI bleeding with  known duodenal AVM, hypertension, and tobacco use   Acute on chronic anemia due to chronic upper GI bleeding, known duodenal AVM: Reports ongoing melena for the last month.  Hemoglobin 7.4 on admission. -Transfusing 2 units PRBCs, goal hemoglobin >8.0 given cardiac history -Repeat hemoglobin in a.m. -Continue IV Protonix 40 mg q12h -Keep n.p.o. -Hold Xarelto -GI to see in a.m.  Atrial flutter: In sinus rhythm on admission with controlled rate. -Hold home Xarelto -Continue home Coreg  Chronic combined systolic and diastolic CHF: EF 96-04% by echocardiogram 05/02/2018.  Appears well compensated. -Continue home Coreg, losartan, and oral Lasix  CAD: Chronic and stable, denies any chest pain.  Continue home Coreg and atorvastatin.  Holding Xarelto as above.  COPD on chronic 3 L supplemental O2: Chronic and stable without respiratory symptoms or wheezing on exam. -Continue supplemental oxygen, home Breo Ellipta, and as needed albuterol and DuoNeb treatments  Hypertension: Stable.  Continue home losartan and Coreg.   DVT prophylaxis: SCDs  Code Status: Full code, confirmed with patient. Family Communication: None present on admission Disposition Plan: Pending GI work-up and clearance Consults called: Gastroenterology to see in a.m. Admission status: Inpatient, patient likely requires greater than 2 midnight length stay for GI evaluation and management of likely upper GI bleeding as he is high risk for decompensation given his comorbidities including CAD, chronic combined systolic and diastolic CHF, and COPD requiring chronic supplemental oxygen.   Darreld Mclean MD [facility]  If 7PM-7AM, please contact night-coverage [email or web address]  10/27/2018, 7:39 PM

## 2018-10-27 NOTE — Progress Notes (Signed)
Patient requested that wallet be sent to security and medications be sent to pharmacy. RN verified contents of wallet with patient and counted medications in front of patient.  Wallet delivered to security, and medications delivered to pharmacy.  Receipts in patient chart.

## 2018-10-27 NOTE — ED Provider Notes (Signed)
[facility] EMERGENCY DEPARTMENT Provider Note   CSN: [id number] Arrival date & time: 10/27/18  1402    History   Chief Complaint Chief Complaint  Patient presents with  . Abnormal Lab    HPI Alanzo Lamb is a 63 y.o. male.     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  Patient for the last week he has had increased shortness of breath.  He notes dark tarry stools for the last month.  Dates he has been followed by a GI doctor and someone several weeks ago.  He had a hemoglobin done outpatient which was 7.8 and he was told to come to the ER for further evaluation.  On arrival to the room patient started complaining of chest pain which lasted approximately 1 minute and was a sharp pain in the left side of the chest.  He denies any nausea, vomiting, abdominal pain.  Patient also notes nosebleeds approximately 6 times a day with a trace amount of blood every time he sneezes.   Past Medical History:  Diagnosis Date  . CAD (coronary artery disease)    a. LHC 5/12:  LAD 20, pLCx 20, pRCA 40, dRCA 40, EF 35%, diff HK  //  b. Myoview 4/16: Overall Impression: High risk stress nuclear study There is no evidence of ischemia. There is severe LV dysfunction. LV Ejection Fraction: 30%. LV Wall Motion: There is global LV hypokinesis.   Marland Kitchen CAP (community acquired pneumonia) 09/2013  . Chronic combined systolic and diastolic CHF (congestive heart failure) (HCC)    a. Echo 4/16:Mild LVH, EF 40-45%, diffuse HK //  b. Echo 8/17: EF 35-40%, diffuse HK, diastolic dysfunction, aortic sclerosis, trivial MR, moderate LAE, normal RVSF, moderate RAE, mild TR, PASP 42 mmHg // c. Echo 4/18: Mild concentric LVH, EF 30-35, normal wall motion, grade 1 diastolic dysfunction, PASP 49  . Cluster headache    "hx; haven't had one in awhile"  (01/09/2016)  . COPD (chronic obstructive pulmonary disease) (HCC)    [name] 01/09/2016  . History of CVA (cerebrovascular accident)   . Hypertension   . NICM (nonischemic cardiomyopathy) (HCC)   . Tobacco abuse     Patient Active Problem List   Diagnosis Date Noted  . Chronic anticoagulation 09/27/2018  . AV malformation of gastrointestinal tract   . Occult GI bleeding   . Heme positive stool   . Iron deficiency anemia   . Symptomatic anemia 09/04/2018  . Tobacco use disorder 09/04/2018  . Noncompliance 06/14/2017  . Elevated LFTs 08/13/2016  . Chronic respiratory failure with hypoxia (HCC) 07/24/2016  . Atrial flutter (HCC)   . Hepatic congestion 07/02/2016  . Chronic combined systolic and diastolic CHF (congestive heart failure) (HCC) 07/01/2016  . COPD with acute exacerbation (HCC) 06/03/2016  . Prediabetes 05/14/2016  . Hemoptysis 05/06/2016  . COPD GOLD III/still smoking  02/29/2016  . Cigarette smoker 01/09/2016  . CAD (coronary artery disease) 10/07/2013  . 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 Centrilobular emphysema (HCC) 10/04/2013  . Pulmonary infiltrate in right lung on CXR 10/03/2013  . Acute on chronic respiratory failure with hypoxia (HCC) 10/03/2013  . Essential hypertension     Past Surgical History:  Procedure Laterality Date  . CARDIAC CATHETERIZATION  10/2010   LM normal, LAD with 20% irregularities, LCX with 20%, RCA with  40% prox and 40% distal - EF of 35%  . COLONOSCOPY W/ BIOPSIES AND POLYPECTOMY    . COLONOSCOPY WITH PROPOFOL N/A 09/06/2018   Procedure: COLONOSCOPY WITH PROPOFOL;  Surgeon: Tressia Danas, MD;  Location: [facility] ENDOSCOPY;  Service: Gastroenterology;  Laterality: N/A;  . ENTEROSCOPY N/A 09/28/2018   Procedure: ENTEROSCOPY;  Surgeon: Jeani Hawking, MD;  Location: [facility] ENDOSCOPY;  Service: Endoscopy;  Laterality: N/A;  . ESOPHAGOGASTRODUODENOSCOPY (EGD) WITH PROPOFOL N/A 09/05/2018   Procedure: ESOPHAGOGASTRODUODENOSCOPY  (EGD) WITH PROPOFOL;  Surgeon: Benancio Deeds, MD;  Location: [facility] ENDOSCOPY;  Service: Gastroenterology;  Laterality: N/A;  . EXCISION MASS HEAD    . GIVENS CAPSULE STUDY N/A 09/06/2018   Procedure: GIVENS CAPSULE STUDY;  Surgeon: Tressia Danas, MD;  Location: [facility] ENDOSCOPY;  Service: Gastroenterology;  Laterality: N/A;  . GIVENS CAPSULE STUDY N/A 09/26/2018   Procedure: GIVENS CAPSULE STUDY;  Surgeon: Beverley Fiedler, MD;  Location: [facility] ENDOSCOPY;  Service: Gastroenterology;  Laterality: N/A;  . INCISION AND DRAINAGE PERIRECTAL ABSCESS N/A 06/05/2017   Procedure: IRRIGATION AND DEBRIDEMENT PERIRECTAL ABSCESS;  Surgeon: Andria Meuse, MD;  Location: [facility] OR;  Service: General;  Laterality: N/A;  . VIDEO BRONCHOSCOPY Bilateral 05/08/2016   Procedure: VIDEO BRONCHOSCOPY WITH FLUORO;  Surgeon: Oretha Milch, MD;  Location: [facility] ENDOSCOPY;  Service: Cardiopulmonary;  Laterality: Bilateral;        Home Medications    Prior to Admission medications   Medication Sig Start Date End Date Taking? Authorizing Provider  albuterol (PROVENTIL HFA;VENTOLIN HFA) 108 (90 Base) MCG/ACT inhaler Inhale 2 puffs into the lungs every 6 (six) hours as needed for wheezing or shortness of breath. 05/15/18   [name], MD  atorvastatin (LIPITOR) 80 MG tablet Take 1 tablet (80 mg total) by mouth daily. 09/06/18   Tressia Danas, MD  carvedilol (COREG) 3.125 MG tablet Take 1 tablet (3.125 mg total) by mouth 2 (two) times daily. 05/15/18 05/15/19  [name], MD  cetirizine (ZYRTEC) 10 MG tablet Take 1 tablet (10 mg total) by mouth daily. 06/18/18   [name], MD  ferrous sulfate 325 (65 FE) MG tablet Take 1 tablet (325 mg total) by mouth daily with breakfast. 09/09/18   Danis, Andreas Blower, MD  fluticasone furoate-vilanterol (BREO ELLIPTA) 100-25 MCG/INH AEPB Inhale 1 puff into the lungs daily. 05/15/18   [name], MD  furosemide (LASIX) 40 MG tablet Take 1 tablet (40 mg total) by mouth daily.  05/16/18 05/16/19  [name], MD  gabapentin (NEURONTIN) 300 MG capsule Take 1 capsule (300 mg total) by mouth at bedtime. 06/18/18   [name], MD  ipratropium-albuterol (DUONEB) 0.5-2.5 (3) MG/3ML SOLN Take 3 mLs by nebulization every 6 (six) hours as needed. 09/25/18   [name], MD  losartan (COZAAR) 50 MG tablet Take 1 tablet (50 mg total) by mouth daily. 05/15/18   [name], MD  pantoprazole (PROTONIX) 40 MG tablet Take 1 tablet (40 mg total) by mouth daily at 6 (six) AM. 09/07/18   Glade Lloyd, MD  rivaroxaban (XARELTO) 20 MG TABS tablet Take 20 mg by mouth daily with supper.    [provider]    Family History Family History  Problem Relation Age of Onset  . Heart disease Mother   . Diabetes Mother   . Colon cancer Mother   . Liver cancer Mother   . Cancer Father        type unknown  . Diabetes Sister        x 2  .  Diabetes Brother     Social History Social History   Tobacco Use  . Smoking status: Current Every Day Smoker    Packs/day: 0.25    Years: 42.00    Pack years: 10.50    Types: Cigarettes  . Smokeless tobacco: Never Used  . Tobacco comment: 3 cigs daily  Substance Use Topics  . Alcohol use: Yes    Alcohol/week: 0.0 standard drinks    Comment: last drink was before xmas  . Drug use: No    Types: Cocaine, Marijuana    Comment: "nothing in 20 years"     Allergies   Lisinopril   Review of Systems Review of Systems  Constitutional: Negative for chills and fever.  HENT: Positive for nosebleeds. Negative for rhinorrhea and sore throat.   Eyes: Negative for visual disturbance.  Respiratory: Positive for cough (chronic) and shortness of breath.   Cardiovascular: Positive for chest pain. Negative for leg swelling.  Gastrointestinal: Positive for blood in stool (black stool). Negative for abdominal pain, diarrhea, nausea and vomiting.  Genitourinary: Negative for dysuria, frequency and urgency.  Musculoskeletal: Negative for  joint swelling and neck pain.  Skin: Negative for rash and wound.  All other systems reviewed and are negative.    Physical Exam Updated Vital Signs BP (!) 100/55 (BP Location: Right Arm)   Pulse 93   Temp 98.7 F (37.1 C) (Oral)   Resp 20   SpO2 100%   Physical Exam Vitals signs and nursing note reviewed.  Constitutional:      Appearance: He is well-developed.  HENT:     Head: Normocephalic and atraumatic.  Eyes:     Conjunctiva/sclera: Conjunctivae normal.  Neck:     Musculoskeletal: Neck supple.  Cardiovascular:     Rate and Rhythm: Normal rate and regular rhythm.     Heart sounds: Normal heart sounds. No murmur.  Pulmonary:     Effort: Pulmonary effort is normal. No respiratory distress.     Breath sounds: Normal breath sounds. No wheezing or rales.  Abdominal:     General: Bowel sounds are normal. There is no distension.     Palpations: Abdomen is soft.     Tenderness: There is no abdominal tenderness.  Genitourinary:    Rectum: No tenderness.     Comments: Rectal exam done with chaperone in the room.  Patient has melena on rectal exam. Musculoskeletal: Normal range of motion.        General: No tenderness or deformity.  Skin:    General: Skin is warm and dry.     Findings: No erythema or rash.  Neurological:     Mental Status: He is alert and oriented to person, place, and time.  Psychiatric:        Behavior: Behavior normal.      ED Treatments / Results  Labs (all labs ordered are listed, but only abnormal results are displayed) Labs Reviewed  SARS CORONAVIRUS 2 (HOSPITAL ORDER, PERFORMED IN [facility])  COMPREHENSIVE METABOLIC PANEL  CBC WITH DIFFERENTIAL/PLATELET  PROTIME-INR  TROPONIN I  POC OCCULT BLOOD, ED  TYPE AND SCREEN    EKG EKG Interpretation  Date/Time:  Monday Oct 27 2018 16:16:40 EDT Ventricular Rate:  82 PR Interval:    QRS Duration: 101 QT Interval:  380 QTC Calculation: 444 R Axis:   -57 Text  Interpretation:  Sinus rhythm Atrial premature complex Borderline short PR interval Left anterior fascicular block Borderline low voltage, extremity leads Minimal ST elevation, anterior leads  Confirmed by Kristine Royal [phone]) on 10/27/2018 4:21:56 PM   Radiology No results found.  Procedures Procedures (including critical care time)  Medications Ordered in ED Medications  pantoprazole (PROTONIX) injection 40 mg (has no administration in time range)  albuterol (VENTOLIN HFA) 108 (90 Base) MCG/ACT inhaler 1-2 puff (has no administration in time range)     Initial Impression / Assessment and Plan / ED Course  I have reviewed the triage vital signs and the nursing notes.  Pertinent labs & imaging results that were available during my care of the patient were reviewed by me and considered in my medical decision making (see chart for details).        Patient presents with shortness of breath, melena.  He is on Xarelto and with a known history of AVM proximal intestine.  He has been followed by [facility] for his history of GI bleeding.  Vital signs are stable.  On physical exam he does have melena.  Hemoccult positive.  Hemoglobin came back at 7.4.  The rest of patient's blood work is unremarkable, chest x-ray shows no pneumonia, pneumothorax, pleural effusion.  Called and spoke with GI, Dr. Chales Abrahams they are agreeable with Protonix.  He recommend n.p.o. after midnight, holding Xarelto.  2 units of blood ordered.  Will discuss admission with the hospitalist.  Discussed with attending, Dr. Rodena Medin agrees with plan.  1940: Spoke hospitalist Dr. Allena Katz who admit the patient.  Carlson Belland was evaluated in Emergency Department on 10/27/2018 for the symptoms described in the history of present illness. He was evaluated in the context of the global COVID-19 pandemic, which necessitated consideration that the patient might be at risk for infection with the SARS-CoV-2 virus that causes COVID-19.  Institutional protocols and algorithms that pertain to the evaluation of patients at risk for COVID-19 are in a state of rapid change based on information released by regulatory bodies including the CDC and federal and state organizations. These policies and algorithms were followed during the patient's care in the ED.  Final Clinical Impressions(s) / ED Diagnoses   Final diagnoses:  None    ED Discharge Orders    None       Rueben Bash 10/27/18 2004    Wynetta Fines, MD 10/29/18 [phone]

## 2018-10-28 ENCOUNTER — Inpatient Hospital Stay (HOSPITAL_COMMUNITY): Payer: Medicare HMO | Admitting: Certified Registered Nurse Anesthetist

## 2018-10-28 ENCOUNTER — Encounter (HOSPITAL_COMMUNITY): Payer: Self-pay | Admitting: *Deleted

## 2018-10-28 ENCOUNTER — Encounter (HOSPITAL_COMMUNITY)
Admission: EM | Disposition: A | Discharge status: Home / Self Care | Payer: Self-pay | Source: Home / Self Care | Attending: Emergency Medicine

## 2018-10-28 DIAGNOSIS — I10 Essential (primary) hypertension: Secondary | ICD-10-CM

## 2018-10-28 DIAGNOSIS — K31819 Angiodysplasia of stomach and duodenum without bleeding: Secondary | ICD-10-CM | POA: Diagnosis not present

## 2018-10-28 DIAGNOSIS — D649 Anemia, unspecified: Secondary | ICD-10-CM

## 2018-10-28 DIAGNOSIS — K3189 Other diseases of stomach and duodenum: Secondary | ICD-10-CM | POA: Diagnosis not present

## 2018-10-28 DIAGNOSIS — K922 Gastrointestinal hemorrhage, unspecified: Secondary | ICD-10-CM | POA: Diagnosis not present

## 2018-10-28 DIAGNOSIS — I4892 Unspecified atrial flutter: Secondary | ICD-10-CM | POA: Diagnosis not present

## 2018-10-28 DIAGNOSIS — K5520 Angiodysplasia of colon without hemorrhage: Secondary | ICD-10-CM | POA: Diagnosis not present

## 2018-10-28 DIAGNOSIS — K921 Melena: Secondary | ICD-10-CM | POA: Diagnosis not present

## 2018-10-28 DIAGNOSIS — I2510 Atherosclerotic heart disease of native coronary artery without angina pectoris: Secondary | ICD-10-CM | POA: Diagnosis not present

## 2018-10-28 HISTORY — PX: ENTEROSCOPY: SHX5533

## 2018-10-28 HISTORY — PX: HOT HEMOSTASIS: SHX5433

## 2018-10-28 LAB — BASIC METABOLIC PANEL
Anion gap: 7 (ref 5–15)
BUN: 11 mg/dL (ref 8–23)
CO2: 39 mmol/L — ABNORMAL HIGH (ref 22–32)
Calcium: 8.6 mg/dL — ABNORMAL LOW (ref 8.9–10.3)
Chloride: 96 mmol/L — ABNORMAL LOW (ref 98–111)
Creatinine, Ser: 0.78 mg/dL (ref 0.61–1.24)
GFR calc Af Amer: >60 mL/min (ref >60)
GFR calc non Af Amer: >60 mL/min (ref >60)
Glucose, Bld: 94 mg/dL (ref 70–99)
Potassium: 4.4 mmol/L (ref 3.5–5.1)
Sodium: 142 mmol/L (ref 135–145)

## 2018-10-28 LAB — CBC
HCT: 26.9 % — ABNORMAL LOW (ref 39.0–52.0)
Hemoglobin: 7.5 g/dL — ABNORMAL LOW (ref 13.0–17.0)
MCH: 25.7 pg — ABNORMAL LOW (ref 26.0–34.0)
MCHC: 27.9 g/dL — ABNORMAL LOW (ref 30.0–36.0)
MCV: 92.1 fL (ref 80.0–100.0)
Platelets: 217 10*3/uL (ref 150–400)
RBC: 2.92 MIL/uL — ABNORMAL LOW (ref 4.22–5.81)
RDW: 15.6 % — ABNORMAL HIGH (ref 11.5–15.5)
WBC: 13 10*3/uL — ABNORMAL HIGH (ref 4.0–10.5)
nRBC: 0.2 % (ref 0.0–0.2)

## 2018-10-28 LAB — HEMOGLOBIN AND HEMATOCRIT, BLOOD
HCT: 30.5 % — ABNORMAL LOW (ref 39.0–52.0)
Hemoglobin: 8.7 g/dL — ABNORMAL LOW (ref 13.0–17.0)

## 2018-10-28 SURGERY — ENTEROSCOPY
Anesthesia: Monitor Anesthesia Care

## 2018-10-28 MED ORDER — SODIUM CHLORIDE 0.9 % IV SOLN
510.0000 mg | Freq: Once | INTRAVENOUS | Status: AC
  Administered 2018-10-28: 510 mg via INTRAVENOUS
  Filled 2018-10-28: qty 17

## 2018-10-28 MED ORDER — NICOTINE 21 MG/24HR TD PT24
21.0000 mg | MEDICATED_PATCH | Freq: Every day | TRANSDERMAL | Status: DC
  Administered 2018-10-28 (×2): 21 mg via TRANSDERMAL
  Filled 2018-10-28 (×2): qty 1

## 2018-10-28 MED ORDER — LACTATED RINGERS IV SOLN
INTRAVENOUS | Status: DC | PRN
  Administered 2018-10-28: 13:00:00 via INTRAVENOUS

## 2018-10-28 MED ORDER — PHENYLEPHRINE 40 MCG/ML (10ML) SYRINGE FOR IV PUSH (FOR BLOOD PRESSURE SUPPORT)
PREFILLED_SYRINGE | INTRAVENOUS | Status: DC | PRN
  Administered 2018-10-28 (×5): 80 ug via INTRAVENOUS

## 2018-10-28 MED ORDER — SODIUM CHLORIDE 0.9 % IV SOLN
510.0000 mg | Freq: Once | INTRAVENOUS | Status: DC
  Filled 2018-10-28: qty 17

## 2018-10-28 MED ORDER — NICOTINE 21 MG/24HR TD PT24: 21.0000 mg | MEDICATED_PATCH | Freq: Every day | TRANSDERMAL | Status: DC

## 2018-10-28 MED ORDER — PROPOFOL 500 MG/50ML IV EMUL
INTRAVENOUS | Status: DC | PRN
  Administered 2018-10-28: 100 ug/kg/min via INTRAVENOUS

## 2018-10-28 MED ORDER — LACTATED RINGERS IV SOLN
INTRAVENOUS | Status: AC | PRN
  Administered 2018-10-28: 1000 mL via INTRAVENOUS

## 2018-10-28 MED ORDER — RIVAROXABAN 20 MG PO TABS
20.0000 mg | ORAL_TABLET | Freq: Every day | ORAL | Status: DC
  Administered 2018-10-28: 20 mg via ORAL
  Filled 2018-10-28: qty 1

## 2018-10-28 MED ORDER — PROPOFOL 10 MG/ML IV BOLUS
INTRAVENOUS | Status: DC | PRN
  Administered 2018-10-28: 10 mg via INTRAVENOUS
  Administered 2018-10-28: 20 mg via INTRAVENOUS

## 2018-10-28 MED ORDER — PANTOPRAZOLE SODIUM 40 MG PO TBEC: 40.0000 mg | DELAYED_RELEASE_TABLET | Freq: Every day | ORAL | Status: DC

## 2018-10-28 MED ORDER — HYDROCODONE-ACETAMINOPHEN 5-325 MG PO TABS
1.0000 | ORAL_TABLET | Freq: Four times a day (QID) | ORAL | Status: DC | PRN
  Administered 2018-10-28 (×2): 1 via ORAL
  Filled 2018-10-28 (×2): qty 1

## 2018-10-28 NOTE — Progress Notes (Signed)
Discharge instructions (including medications) discussed with and copy provided to patient/caregiver. All belongings sent with patient, including valuables, home medications and home O2.

## 2018-10-28 NOTE — Op Note (Signed)
[facility] Patient Name: Jared [name] Procedure Date : 10/28/2018 MRN: [id number] Attending MD: Tressia DanasKimberly Chaston Bradburn MD, MD Date of Birth: 03/07/1956 CSN: [id number] Age: [age] Admit Type: Inpatient Procedure:                Small bowel enteroscopy Indications:              Recurrent bleeding in the small bowel. Capsule                            endoscopy last month showed proximal small bowel                            AVM. Providers:                Tressia DanasKimberly Rhys Anchondo MD, MD, Dwain SarnaPatricia Ford, RN, Matthew FolksBrooke                            Cummings, Technician, Verita SchneidersPrasun Sharma, Technician Referring MD:              Medicines:                See the Anesthesia note for documentation of the                            administered medications Complications:            No immediate complications. Estimated blood loss:                            None. Estimated Blood Loss:     Estimated blood loss: none. Procedure:                Pre-Anesthesia Assessment:                           - Prior to the procedure, a History and Physical                            was performed, and patient medications and                            allergies were reviewed. The patient's tolerance of                            previous anesthesia was also reviewed. The risks                            and benefits of the procedure and the sedation                            options and risks were discussed with the patient.                            All questions were answered, and informed consent                            was  obtained. Prior Anticoagulants: The patient has                            taken Xarelto (rivaroxaban), last dose was 2 days                            prior to procedure. ASA Grade Assessment: III - A                            patient with severe systemic disease. After                            reviewing the risks and benefits, the patient was                            deemed in  satisfactory condition to undergo the                            procedure.                           After obtaining informed consent, the endoscope was                            passed under direct vision. Throughout the                            procedure, the patient's blood pressure, pulse, and                            oxygen saturations were monitored continuously. The                            [id number]-[id number] ([id number] Olympus pediatric colonoscope                            was introduced through the mouth and advanced to                            150 cm from the incisors. The small bowel                            enteroscopy was accomplished without difficulty.                            The patient tolerated the procedure well. Scope In: Scope Out: Findings:      The esophagus was normal.      The stomach was normal.      A single non-bleeding was found 100 cm from the incisors. This was       successfully treated with APC (0.5 flow, 20 watts). No bleeding occured       during the procedure. No other AVMs were identied. No other small bowel       mucosal abnormalities seen. Impression:               -  Normal esophagus.                           - Normal stomach.                           - Single AVM located 100 cm from the incisors.                            Treated with APC.                           - No specimens collected. Recommendation:           - Resume regular diet today.                           - Feraheme planned for later today.                           - Okay to resume Xarelto today if clinically                            indicated.                           - No further inpatient GI evaluation planned. Will                            plan outpatient follow-up with Dr. Myrtie Neither. Procedure Code(s):        --- Professional ---                           [phone], Small intestinal endoscopy, enteroscopy                            beyond second portion of duodenum,  not including                            ileum; diagnostic, including collection of                            specimen(s) by brushing or washing, when performed                            (separate procedure) Diagnosis Code(s):        --- Professional ---                           K31.89, Other diseases of stomach and duodenum                           K92.2, Gastrointestinal hemorrhage, unspecified CPT copyright 2019 American [employer] Association. All rights reserved. The codes documented in this report are preliminary and upon coder review may  be revised to meet current compliance requirements. Tressia Danas MD, MD 10/28/2018 2:15:34 PM This report has been signed electronically. Number of Addenda: 0

## 2018-10-28 NOTE — Care Management Obs Status (Addendum)
MEDICARE OBSERVATION STATUS NOTIFICATION   Patient Details  Name: Jared [name] MRN: [id number] Date of Birth: 28-Feb-1956   Medicare Observation Status Notification Given:  Yes, Telephone consent; letter explained in detail, all questions answered.   Cherrie Distance, RN 10/28/2018, 3:14 PM

## 2018-10-28 NOTE — Transfer of Care (Signed)
Immediate Anesthesia Transfer of Care Note  Patient: Jared [name]  Procedure(s) Performed: ENTEROSCOPY (N/A ) HOT HEMOSTASIS (ARGON PLASMA COAGULATION/BICAP) (N/A )  Patient Location: Endoscopy Unit  Anesthesia Type:MAC  Level of Consciousness: awake, alert  and oriented  Airway & Oxygen Therapy: Patient Spontanous Breathing and Patient connected to nasal cannula oxygen  Post-op Assessment: Report given to RN, Post -op Vital signs reviewed and stable and Patient moving all extremities X 4  Post vital signs: Reviewed and stable  Last Vitals:  Vitals Value Taken Time  BP    Temp    Pulse 90 10/28/2018  1:53 PM  Resp 21 10/28/2018  1:53 PM  SpO2 95 % 10/28/2018  1:53 PM  Vitals shown include unvalidated device data.  Last Pain:  Vitals:   10/28/18 1153  TempSrc: Oral  PainSc: 0-No pain         Complications: No apparent anesthesia complications

## 2018-10-28 NOTE — Consult Note (Signed)
[facility] Consult: 8:57 AM 10/28/2018  LOS: 1 day    Referring Provider: Dr. Benjamine Mola Primary Care Physician:  [name], MD Primary Gastroenterologist:  Dr. Myrtie Neither     Reason for Consultation:  GIB in pt on Xarelto.     HPI: Jared [name] is a 63 y.o. male.  Hx CAD.  Atrial flutter, on Xarelto.  CVA.  Nonischemic cardiomyopathy.  Combined systolic/diastolic CHF.  EF 45 to 50% (previously 30 -35%), grade 1 diastolic dysfunction on 2D echo 04/2018.  COPD, emphysema..  Elevated LFTs noted in 07/2016.  Ultrasound showed nodular liver, suggestive of cirrhosis.   Previous ultrasounds 04/2016, 12/2015 showed normal liver echotexture.  Elastography 08/2016 showed liver fibrosis with F2/F3 metavir score.  Changes in liver attributed to hepatic congestion  (cardiac cirrhosis ) hepatitis B and C serologies negative.  09/05/2018.  Iron deficiency anemia Hgb 4.9.  Ferritin 3.  FOBT positive dark stools in setting of Xarelto, Goody powders.  Received PRBC x3.  Iron 9, ferritin 3.  Received 4 PRBCs 09/25/2018 recurrent anemia/Hgb 6.8, no reports of dark stools and no use of NSAIDs.  Received 2 PRBCs. 09/05/2018 EGD.  Dr. Adela Lank.  Mild esophagitis, irregular Z line was not biopsied.  2 cm hiatal hernia.  Normal stomach and duodenum to D2.  Meds include ferrous sulfate 325 mg daily 09/06/2018 colonoscopy.  Dr. Orvan Falconer.  Entire colon and 10 cm of distal terminal ileum normal on direct and retroflexed views 09/10/2018 capsule endoscopy.  Negative study, no bleeding or potential sources of bleeding found 09/26/18 capsule endoscopy.  Dr. Elnoria Howard.  He noted bleeding BM in the proximal small bowel.  Site in mid to latter duodenum versus proximal jejunum. 09/28/2018 enteroscopy.  Dr. Jeani Hawking normal study into the jejunum.  No bleeding,  suspicious lesions or AVMs.   At baseline the patient has dyspnea.  This has not changed in the last few weeks.  When he stands to cook at the stove his legs feel weak and he has to sit down.  He does not report pain in his calves with standing or walking.  Also says his head feels very heavy like his neck is not strong enough to hold his head up.  Sometimes his eyelids feel heavy and he has difficulty raising them.  No limb weakness.  No dizziness or lightheaded feeling.  Occasionally he notes brief runs of rapid heart rate.  Sometimes has a few seconds of a sense of swelling in his chest which is not exertional and not painful.  No lower extremity edema.  Appetite and p.o. intake is stable and not compromised.  He says he is gone from about 205 to 195 pounds over the last few months.  Stools are greenish, dark but not melenic.  Never sees blood.  No nausea.  No dysphagia.   Patient was sent by his PMD for routine lab work yesterday.  When he returned home he received a phone call from the MD office saying he should go to the emergency room because his blood counts were low.  Hgb  7.7, compared with 8 two weeks ago. Hemoglobins 7.5 this morning.  MCV 87. FOBT positive BUN is not elevated, nor has it ever been elevated during previous GI bleed episodes CXR showed cardiomegaly and mild lung hyperexpansion possibly representing COPD. SARS Coronavirus 2/COVID testing negative  Meds include Fe SO4 325 mg/day, Protonix 40 mg/day, Xarelto 20 mg/day Last dose of Xarelto 10/26/2018  Says he quit all alcohol recreational drug use fall 2019.  Mother had colon cancer with liver mets.    Past Medical History:  Diagnosis Date  . CAD (coronary artery disease)    a. LHC 5/12:  LAD 20, pLCx 20, pRCA 40, dRCA 40, EF 35%, diff HK  //  b. Myoview 4/16: Overall Impression: High risk stress nuclear study There is no evidence of ischemia. There is severe LV dysfunction. LV Ejection Fraction: 30%. LV Wall Motion:  There is global LV hypokinesis.   Marland Kitchen CAP (community acquired pneumonia) 09/2013  . Chronic combined systolic and diastolic CHF (congestive heart failure) (HCC)    a. Echo 4/16:Mild LVH, EF 40-45%, diffuse HK //  b. Echo 8/17: EF 35-40%, diffuse HK, diastolic dysfunction, aortic sclerosis, trivial MR, moderate LAE, normal RVSF, moderate RAE, mild TR, PASP 42 mmHg // c. Echo 4/18: Mild concentric LVH, EF 30-35, normal wall motion, grade 1 diastolic dysfunction, PASP 49  . Cluster headache    "hx; haven't had one in awhile" (01/09/2016)  . COPD (chronic obstructive pulmonary disease) (HCC)    [name] 01/09/2016  . History of CVA (cerebrovascular accident)   . Hypertension   . NICM (nonischemic cardiomyopathy) (HCC)   . Tobacco abuse     Past Surgical History:  Procedure Laterality Date  . CARDIAC CATHETERIZATION  10/2010   LM normal, LAD with 20% irregularities, LCX with 20%, RCA with 40% prox and 40% distal - EF of 35%  . COLONOSCOPY W/ BIOPSIES AND POLYPECTOMY    . COLONOSCOPY WITH PROPOFOL N/A 09/06/2018   Procedure: COLONOSCOPY WITH PROPOFOL;  Surgeon: Tressia Danas, MD;  Location[facility] ENDOSCOPY;  Service: Gastroenterology;  Laterality: N/A;  . ENTEROSCOPY N/A 09/28/2018   Procedure: ENTEROSCOPY;  Surgeon: Jeani Hawking, MD;  Location: [facility] ENDOSCOPY;  Service: Endoscopy;  Laterality: N/A;  . ESOPHAGOGASTRODUODENOSCOPY (EGD) WITH PROPOFOL N/A 09/05/2018   Procedure: ESOPHAGOGASTRODUODENOSCOPY (EGD) WITH PROPOFOL;  Surgeon: Benancio Deeds, MD;  Location: [facility] ENDOSCOPY;  Service: Gastroenterology;  Laterality: N/A;  . EXCISION MASS HEAD    . GIVENS CAPSULE STUDY N/A 09/06/2018   Procedure: GIVENS CAPSULE STUDY;  Surgeon: Tressia Danas, MD;  Location: [facility] ENDOSCOPY;  Service: Gastroenterology;  Laterality: N/A;  . GIVENS CAPSULE STUDY N/A 09/26/2018   Procedure: GIVENS CAPSULE STUDY;  Surgeon: Beverley Fiedler, MD;  Location: [facility] ENDOSCOPY;  Service: Gastroenterology;  Laterality: N/A;  .  INCISION AND DRAINAGE PERIRECTAL ABSCESS N/A 06/05/2017   Procedure: IRRIGATION AND DEBRIDEMENT PERIRECTAL ABSCESS;  Surgeon: Andria Meuse, MD;  Location: [facility] OR;  Service: General;  Laterality: N/A;  . VIDEO BRONCHOSCOPY Bilateral 05/08/2016   Procedure: VIDEO BRONCHOSCOPY WITH FLUORO;  Surgeon: Oretha Milch, MD;  Location: [facility] ENDOSCOPY;  Service: Cardiopulmonary;  Laterality: Bilateral;    Prior to Admission medications   Medication Sig Start Date End Date Taking? Authorizing Provider  albuterol (PROVENTIL HFA;VENTOLIN HFA) 108 (90 Base) MCG/ACT inhaler Inhale 2 puffs into the lungs every 6 (six) hours as needed for wheezing or shortness of breath. 05/15/18  Yes [name], MD  atorvastatin (LIPITOR) 80 MG tablet Take 1 tablet (80 mg  total) by mouth daily. 09/06/18  Yes Tressia DanasBeavers, Kimberly, MD  carvedilol (COREG) 3.125 MG tablet Take 1 tablet (3.125 mg total) by mouth 2 (two) times daily. 05/15/18 05/15/19 Yes Hoy RegisterNewlin, Enobong, MD  cetirizine (ZYRTEC) 10 MG tablet Take 1 tablet (10 mg total) by mouth daily. 06/18/18  Yes Hoy RegisterNewlin, Enobong, MD  ferrous sulfate 325 (65 FE) MG tablet Take 1 tablet (325 mg total) by mouth daily with breakfast. 09/09/18  Yes Danis, Starr LakeHenry L III, MD  fluticasone furoate-vilanterol (BREO ELLIPTA) 100-25 MCG/INH AEPB Inhale 1 puff into the lungs daily. 05/15/18  Yes Hoy RegisterNewlin, Enobong, MD  furosemide (LASIX) 40 MG tablet Take 1 tablet (40 mg total) by mouth daily. 05/16/18 05/16/19 Yes Hoy RegisterNewlin, Enobong, MD  gabapentin (NEURONTIN) 300 MG capsule Take 1 capsule (300 mg total) by mouth at bedtime. 06/18/18  Yes Newlin, Enobong, MD  ipratropium-albuterol (DUONEB) 0.5-2.5 (3) MG/3ML SOLN Take 3 mLs by nebulization every 6 (six) hours as needed. Patient taking differently: Take 3 mLs by nebulization every 6 (six) hours as needed (SOB).  09/25/18  Yes Hoy RegisterNewlin, Enobong, MD  losartan (COZAAR) 50 MG tablet Take 1 tablet (50 mg total) by mouth daily. 05/15/18  Yes Hoy RegisterNewlin, Enobong, MD   pantoprazole (PROTONIX) 40 MG tablet Take 1 tablet (40 mg total) by mouth daily at 6 (six) AM. 09/07/18  Yes Glade LloydAlekh, Kshitiz, MD  rivaroxaban (XARELTO) 20 MG TABS tablet Take 20 mg by mouth daily with supper.   Yes [provider]    Scheduled Meds: . atorvastatin  80 mg Oral Daily  . carvedilol  3.125 mg Oral BID  . fluticasone furoate-vilanterol  1 puff Inhalation Daily  . furosemide  40 mg Oral Daily  . losartan  50 mg Oral Daily  . nicotine  21 mg Transdermal Daily  . pantoprazole  40 mg Intravenous Q12H  . sodium chloride flush  3 mL Intravenous Q12H   Infusions:  PRN Meds: acetaminophen **OR** acetaminophen, albuterol, HYDROcodone-acetaminophen, ipratropium-albuterol   Allergies as of 10/27/2018 - Review Complete 10/27/2018  Allergen Reaction Noted  . Lisinopril Cough 06/29/2014    Family History  Problem Relation Age of Onset  . Heart disease Mother   . Diabetes Mother   . Colon cancer Mother   . Liver cancer Mother   . Cancer Father        type unknown  . Diabetes Sister        x 2  . Diabetes Brother     Social History   Socioeconomic History  . Marital status: Single    Spouse name: Not on file  . Number of children: 1  . Years of education: Not on file  . Highest education level: Not on file  Occupational History  . Occupation: retired  Engineer, productionocial Needs  . Financial resource strain: Not on file  . Food insecurity:    Worry: Not on file    Inability: Not on file  . Transportation needs:    Medical: Not on file    Non-medical: Not on file  Tobacco Use  . Smoking status: Current Every Day Smoker    Packs/day: 0.25    Years: 42.00    Pack years: 10.50    Types: Cigarettes  . Smokeless tobacco: Never Used  . Tobacco comment: 3 cigs daily  Substance and Sexual Activity  . Alcohol use: Yes    Alcohol/week: 0.0 standard drinks    Comment: last drink was before xmas  . Drug use: No    Types: Cocaine,  Marijuana    Comment: "nothing in 20  years"  . Sexual activity: Not Currently  Lifestyle  . Physical activity:    Days per week: Not on file    Minutes per session: Not on file  . Stress: Not on file  Relationships  . Social connections:    Talks on phone: Not on file    Gets together: Not on file    Attends religious service: Not on file    Active member of club or organization: Not on file    Attends meetings of clubs or organizations: Not on file    Relationship status: Not on file  . Intimate partner violence:    Fear of current or ex partner: Not on file    Emotionally abused: Not on file    Physically abused: Not on file    Forced sexual activity: Not on file  Other Topics Concern  . Not on file  Social History Narrative   unemployed    REVIEW OF SYSTEMS: Constitutional: Per HPI. ENT:  No nose bleeds Pulm: Per HPI. CV:  No palpitations, no LE edema.  Her HPI. GU:  No hematuria, no frequency GI: HPI.  No dysphagia. Heme: Unusual or excessive bleeding or bruising. Transfusions: Per HPI. Neuro:  No headaches, no peripheral tingling or numbness.  No blurry vision Derm:  No itching, no rash or sores.  Endocrine:  No sweats or chills.  No polyuria or dysuria Immunization: re Viewed. Travel:  None beyond local counties in last few months.    PHYSICAL EXAM: Vital signs in last 24 hours: Vitals:   10/28/18 0754 10/28/18 0814  BP: 116/65 119/68  Pulse: 79 77  Resp: 15 14  Temp: 98.5 F (36.9 C) (!) 97.5 F (36.4 C)  SpO2: 99% 100%   Wt Readings from Last 3 Encounters:  10/28/18 88.5 kg  10/07/18 89.4 kg  09/28/18 89.6 kg    General: Lessened, well-appearing AAM.  Comfortable, no distress. Head: No facial asymmetry or swelling.  No signs of head trauma. Eyes: No scleral icterus.  No conjunctival pallor.  EOMI.  No weakness of the eyelids. Ears: Not hard of hearing Nose: No congestion or discharge. Mouth: Excellent teeth.  Tongue midline.  Mucosa pink, moist, clear. Neck: No JVD, no masses, no  thyromegaly. Lungs: Clear bilaterally but greatly reduced breath sounds throughout lung fields.  No dyspnea with speech or at rest.  No cough. Heart: RRR.  No MRG.  S1, S2 present Abdomen: Soft.  Nondistended, nontender.  Active bowel sounds.  No hernias, bruits, HSM, masses..   Rectal: Deferred rectal exam.  Stool tested FOBT positive in the lab. Musc/Skeltl: No joint redness, swelling or gross deformity. Extremities: No CCE. Neurologic: Fully alert and oriented x3.  Moves all 4 limbs without tremor or weakness. Skin: No rash, no sores, no suspicious lesions.  No significant purpura/bruising. Nodes: No cervical adenopathy. Psych: Calm, pleasant, cooperative.  Intake/Output from previous day: 05/18 0701 - 05/19 0700 In: 145.3 [I.V.:25.3; Blood:120] Out: 200 [Urine:200] Intake/Output this shift: Total I/O In: 315 [Blood:315] Out: 200 [Urine:200]  LAB RESULTS: Recent Labs    10/27/18 0911 10/27/18 1635 10/28/18 0006  WBC 8.3 11.2* 13.0*  HGB 7.7 Repeated and verified X2.* 7.4* 7.5*  HCT 25.9 Repeated and verified X2.* 28.2* 26.9*  PLT 247.0 253 217   BMET Lab Results  Component Value Date   NA 142 10/28/2018   NA 144 10/27/2018   NA 141 09/28/2018   K 4.4 10/28/2018  K 4.1 10/27/2018   K 3.6 09/28/2018   CL 96 (L) 10/28/2018   CL 93 (L) 10/27/2018   CL 90 (L) 09/28/2018   CO2 39 (H) 10/28/2018   CO2 42 (H) 10/27/2018   CO2 43 (H) 09/28/2018   GLUCOSE 94 10/28/2018   GLUCOSE 94 10/27/2018   GLUCOSE 93 09/28/2018   BUN 11 10/28/2018   BUN 13 10/27/2018   BUN 5 (L) 09/28/2018   CREATININE 0.78 10/28/2018   CREATININE 0.95 10/27/2018   CREATININE 0.71 09/28/2018   CALCIUM 8.6 (L) 10/28/2018   CALCIUM 8.6 (L) 10/27/2018   CALCIUM 8.9 09/28/2018   LFT Recent Labs    10/27/18 1635  PROT 6.8  ALBUMIN 3.7  AST 25  ALT 15  ALKPHOS 62  BILITOT 0.4   PT/INR Lab Results  Component Value Date   INR 1.3 (H) 10/27/2018   INR 1.6 (H) 09/05/2018   INR 2.6  09/04/2017   Hepatitis Panel No results for input(s): HEPBSAG, HCVAB, HEPAIGM, HEPBIGM in the last 72 hours. C-Diff No components found for: CDIFF Lipase     Component Value Date/Time   LIPASE 47 07/01/2016 2019    Drugs of Abuse  No results found for: LABOPIA, COCAINSCRNUR, LABBENZ, AMPHETMU, THCU, LABBARB   RADIOLOGY STUDIES: Dg Chest Portable 1 View  Result Date: 10/27/2018 CLINICAL DATA:  Shortness of breath EXAM: PORTABLE CHEST 1 VIEW COMPARISON:  09/25/2018 FINDINGS: The cardiac size is increased. There is no pneumothorax. No large pleural effusion, however the costophrenic angles are not fully visualized bilaterally. Old left-sided rib fractures are noted. The lungs are somewhat hyperexpanded which can be seen in patients with COPD. Aortic calcifications are noted. IMPRESSION: 1. No acute cardiopulmonary process identified. 2. Cardiomegaly. 3. Mildly hyperexpanded lungs which can be seen in patients with COPD. Electronically Signed   By: Katherine Mantle M.D.   On: 10/27/2018 16:46     IMPRESSION:   *   Acute on chronic anemia.  Current Hgb is within half a gram of measurement 16 days ago.  Patient weakness and DOE are no worse in recent days than they have been for the past several weeks.  *   Dyspnea.  Multifactorial.  No worse in past several weeks.  Certainly anemia contributing but he has underlying COPD/emphysema and CHF.  Chest x-ray negative for acute heart failure, pneumonia, bronchitis.  *    History FOBT positive stools, dark on at least one occasion in the past.  Underwent EGD, colonoscopy, video capsule endoscopy x2, enteroscopy between March and April 2020.  At VCE #2, bleeding AVM seen in distal duodenum versus proximal jejunum but at enteroscopy, no AVM, no bleeding noted.  *    Liver fibrosis.  No evidence for portal hypertension, gastric/esophageal varices on recent upper endoscopies.    *    Atrial flutter, on chronic Xarelto.    PLAN:     *    Feraheme infusion.  Plan to do this this evening.  *    Case discussed with Dr. Orvan Falconer.  Plan enteroscopy today.  *    CBC in morning  *    Does not need Protonix IV.  Switching him back to once daily oral Protonix   Jennye Moccasin  10/28/2018, 8:57 AM Phone [phone]

## 2018-10-28 NOTE — Anesthesia Preprocedure Evaluation (Addendum)
Anesthesia Evaluation  Patient identified by MRN, date of birth, ID band Patient awake    Reviewed: Allergy & Precautions, NPO status , Patient's Chart, lab work & pertinent test results  Airway Mallampati: II  TM Distance: >3 FB     Dental  (+) Dental Advisory Given, Loose, Teeth Intact,    Pulmonary COPD, Current Smoker,    breath sounds clear to auscultation       Cardiovascular hypertension, Pt. on medications and Pt. on home beta blockers + CAD and +CHF   Rhythm:Regular Rate:Normal     Neuro/Psych  Headaches,    GI/Hepatic Neg liver ROS, GI bleed    Endo/Other  negative endocrine ROS  Renal/GU negative Renal ROS     Musculoskeletal   Abdominal   Peds  Hematology  (+) anemia ,   Anesthesia Other Findings   Reproductive/Obstetrics                            Anesthesia Physical Anesthesia Plan  ASA: III  Anesthesia Plan: MAC   Post-op Pain Management:    Induction: Intravenous  PONV Risk Score and Plan: 0 and Propofol infusion and Treatment may vary due to age or medical condition  Airway Management Planned: Natural Airway and Nasal Cannula  Additional Equipment:   Intra-op Plan:   Post-operative Plan:   Informed Consent: I have reviewed the patients History and Physical, chart, labs and discussed the procedure including the risks, benefits and alternatives for the proposed anesthesia with the patient or authorized representative who has indicated his/her understanding and acceptance.       Plan Discussed with: CRNA  Anesthesia Plan Comments:         Anesthesia Quick Evaluation

## 2018-10-28 NOTE — Care Management CC44 (Signed)
Condition Code 44 Documentation Completed  Patient Details  Name: Jared [name] MRN: [id number] Date of Birth: 1955/08/18   Condition Code 44 given:  Yes Patient signature on Condition Code 44 notice:  Yes(telephone consent, letter explained in detail) Documentation of 2 MD's agreement:  Yes Code 44 added to claim:  Yes    Cherrie Distance, RN 10/28/2018, 3:14 PM

## 2018-10-28 NOTE — Progress Notes (Addendum)
Patient spiked fever post blood transfusion.  Patient has not other symptoms.  Notified triad.  Will continue to monitor.

## 2018-10-28 NOTE — Anesthesia Procedure Notes (Signed)
Procedure Name: MAC Date/Time: 10/28/2018 1:20 PM Performed by: Harden Mo, CRNA Pre-anesthesia Checklist: Patient identified, Emergency Drugs available, Suction available and Patient being monitored Patient Re-evaluated:Patient Re-evaluated prior to induction Oxygen Delivery Method: Nasal cannula Preoxygenation: Pre-oxygenation with 100% oxygen Induction Type: IV induction Placement Confirmation: positive ETCO2 and breath sounds checked- equal and bilateral Dental Injury: Teeth and Oropharynx as per pre-operative assessment

## 2018-10-28 NOTE — Progress Notes (Signed)
Patient requesting nicotine patch.  RN paged triad for order.

## 2018-10-28 NOTE — TOC Initial Note (Signed)
Transition of Care [facility]) - Initial/Assessment Note    Patient Details  Name: Jared [name] MRN: [id number] Date of Birth: [birth year]  [facility] CM/SW Contact:    Reola Mosher Phone Number: [phone] 10/28/2018, 8:21 AM  Clinical Narrative:                 10/28/2018 - Patient known to me from previous admission; CM following for progression of care. Abelino Derrick RN,MHA,BSN  09/26/2018 - Clinical Narrative:                 Patient lives in a boarding, supportive sister that assist as needed; WQV:LDKCCQ, Enobong, MD; has private insurance with Medicaid with prescription drug coverage; pharmacy of choice is [facility], [facility]; DME - home oxygen through [employer]; pt continue to smoke, pt encourage to stop smoking. He stated " I need someone to cook for me." He is independent of his ADL's; CM placed a referral to [employer] Whe[employer]; Also application given for transportation [place] for the patient to complete and mail off. Abelino Derrick RN,MHA,BSN  Expected Discharge Plan: Home/Self Care Barriers to Discharge: No Barriers Identified   Patient Goals and CMS Choice Patient states their goals for this hospitalization and ongoing recovery are:: to stop bleeding CMS Medicare.gov Compare Post Acute Care list provided to:: Patient Choice offered to / list presented to : NA  Expected Discharge Plan and Services Expected Discharge Plan: Home/Self Care In-house Referral: NA Discharge Planning Services: CM Consult Post Acute Care Choice: NA Living arrangements for the past 2 months: (Boarding house)                 DME Arranged: N/A DME Agency: NA       HH Arranged: NA          Prior Living Arrangements/Services Living arrangements for the past 2 months: (Boarding house) Lives with:: Self          Need for Family Participation in Patient Care: No (Comment) Care giver support system in place?: No (comment)      Activities of Daily Living Home Assistive  Devices/Equipment: None ADL Screening (condition at time of admission) Patient's cognitive ability adequate to safely complete daily activities?: Yes Is the patient deaf or have difficulty hearing?: No Does the patient have difficulty seeing, even when wearing glasses/contacts?: No Does the patient have difficulty concentrating, remembering, or making decisions?: No Patient able to express need for assistance with ADLs?: Yes Does the patient have difficulty dressing or bathing?: No Independently performs ADLs?: Yes (appropriate for developmental age) Does the patient have difficulty walking or climbing stairs?: No Weakness of Legs: None Weakness of Arms/Hands: None  Permission Sought/Granted                  Emotional Assessment         Alcohol / Substance Use: Other (comment) Psych Involvement: No (comment)  Admission diagnosis:  Melena [K92.1] Symptomatic anemia [D64.9] Gastrointestinal hemorrhage, unspecified gastrointestinal hemorrhage type [K92.2] Patient Active Problem List   Diagnosis Date Noted  . GI bleed 10/27/2018  . Chronic anticoagulation 09/27/2018  . AV malformation of gastrointestinal tract   . Occult GI bleeding   . Heme positive stool   . Iron deficiency anemia   . Symptomatic anemia 09/04/2018  . Tobacco use disorder 09/04/2018  . Noncompliance 06/14/2017  . Elevated LFTs 08/13/2016  . Chronic respiratory failure with hypoxia (HCC) 07/24/2016  . Atrial flutter (HCC)   . Hepatic congestion 07/02/2016  .  Chronic combined systolic and diastolic CHF (congestive heart failure) (HCC) 07/01/2016  . COPD with acute exacerbation (HCC) 06/03/2016  . Prediabetes 05/14/2016  . Hemoptysis 05/06/2016  . COPD GOLD III/still smoking  02/29/2016  . Cigarette smoker 01/09/2016  . CAD (coronary artery disease) 10/07/2013  . 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 Centrilobular emphysema (HCC) 10/04/2013  . Pulmonary infiltrate in right lung on  CXR 10/03/2013  . Acute on chronic respiratory failure with hypoxia (HCC) 10/03/2013  . Essential hypertension    PCP:  [name], MD Pharmacy:   [facility] [place], [place] [address] [place] [address] Phone: [phone] Fax: [phone]     Social Determinants of Health (SDOH) Interventions    Readmission Risk Interventions No flowsheet data found.

## 2018-10-28 NOTE — Anesthesia Postprocedure Evaluation (Signed)
Anesthesia Post Note  Patient: Jared [name]  Procedure(s) Performed: ENTEROSCOPY (N/A ) HOT HEMOSTASIS (ARGON PLASMA COAGULATION/BICAP) (N/A )     Patient location during evaluation: PACU Anesthesia Type: MAC Level of consciousness: awake and alert Pain management: pain level controlled Vital Signs Assessment: post-procedure vital signs reviewed and stable Respiratory status: spontaneous breathing, nonlabored ventilation, respiratory function stable and patient connected to nasal cannula oxygen Cardiovascular status: stable and blood pressure returned to baseline Postop Assessment: no apparent nausea or vomiting Anesthetic complications: no    Last Vitals:  Vitals:   10/28/18 1400 10/28/18 1410  BP: (!) 112/58 122/62  Pulse: 86 68  Resp: 12 19  Temp:    SpO2: 92% 100%    Last Pain:  Vitals:   10/28/18 1353  TempSrc: Oral  PainSc: 5                  Tiajuana Amass

## 2018-10-28 NOTE — TOC Initial Note (Signed)
Transition of Care [facility]) - Initial/Assessment Note    Patient Details  Name: Jared [name] MRN: [id number] Date of Birth: 06/21/1955  [facility] CM/SW Contact:    Reola Mosher Phone Number: [phone] 10/28/2018, 2:38 PM  Clinical Narrative:                 Patient discharging home today; has home oxygen through [employer], Zack called to deliver a portable oxygen tank to the room for dc home today;   Expected Discharge Plan: Home/Self Care Barriers to Discharge: No Barriers Identified   Patient Goals and CMS Choice Patient states their goals for this hospitalization and ongoing recovery are:: to stop bleeding CMS Medicare.gov Compare Post Acute Care list provided to:: Patient Choice offered to / list presented to : NA  Expected Discharge Plan and Services Expected Discharge Plan: Home/Self Care In-house Referral: NA Discharge Planning Services: CM Consult Post Acute Care Choice: NA Living arrangements for the past 2 months: (Boarding house)                 DME Arranged: N/A DME Agency: NA       HH Arranged: NA          Prior Living Arrangements/Services Living arrangements for the past 2 months: (Boarding house) Lives with:: Self          Need for Family Participation in Patient Care: No (Comment) Care giver support system in place?: No (comment)      Activities of Daily Living Home Assistive Devices/Equipment: None ADL Screening (condition at time of admission) Patient's cognitive ability adequate to safely complete daily activities?: Yes Is the patient deaf or have difficulty hearing?: No Does the patient have difficulty seeing, even when wearing glasses/contacts?: No Does the patient have difficulty concentrating, remembering, or making decisions?: No Patient able to express need for assistance with ADLs?: Yes Does the patient have difficulty dressing or bathing?: No Independently performs ADLs?: Yes (appropriate for  developmental age) Does the patient have difficulty walking or climbing stairs?: No Weakness of Legs: None Weakness of Arms/Hands: None  Permission Sought/Granted                  Emotional Assessment         Alcohol / Substance Use: Other (comment) Psych Involvement: No (comment)  Admission diagnosis:  Melena [K92.1] Symptomatic anemia [D64.9] Gastrointestinal hemorrhage, unspecified gastrointestinal hemorrhage type [K92.2] Patient Active Problem List   Diagnosis Date Noted  . GI bleed 10/27/2018  . Chronic anticoagulation 09/27/2018  . AV malformation of gastrointestinal tract   . Occult GI bleeding   . Heme positive stool   . Iron deficiency anemia   . Symptomatic anemia 09/04/2018  . Tobacco use disorder 09/04/2018  . Noncompliance 06/14/2017  . Elevated LFTs 08/13/2016  . Chronic respiratory failure with hypoxia (HCC) 07/24/2016  . Atrial flutter (HCC)   . Hepatic congestion 07/02/2016  . Chronic combined systolic and diastolic CHF (congestive heart failure) (HCC) 07/01/2016  . COPD with acute exacerbation (HCC) 06/03/2016  . Prediabetes 05/14/2016  . Hemoptysis 05/06/2016  . COPD GOLD III/still smoking  02/29/2016  . Cigarette smoker 01/09/2016  . CAD (coronary artery disease) 10/07/2013  . 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 Centrilobular emphysema (HCC) 10/04/2013  . Pulmonary infiltrate in right lung on CXR 10/03/2013  . Acute on chronic respiratory failure with hypoxia (HCC) 10/03/2013  . Essential hypertension    PCP:  [name], MD Pharmacy:   [facility]  Health & Wellness - [place], [place] - [address] [place] [address] Phone: [phone] Fax: [phone]     Social Determinants of Health (SDOH) Interventions    Readmission Risk Interventions No flowsheet data found.

## 2018-10-28 NOTE — Discharge Summary (Signed)
Physician Discharge Summary  [place] UXL:[id number] DOB: 10/24/1955 DOA: 10/27/2018  PCP: [name], MD  Admit date: 10/27/2018 Discharge date: 10/28/2018  Admitted From: home Discharge disposition: home   Recommendations for Outpatient Follow-Up:   1. Cbc 1 week   Discharge Diagnosis:   Principal Problem:   GI bleed Active Problems:   Essential hypertension   CAD (coronary artery disease)   COPD GOLD III/still smoking    Chronic combined systolic and diastolic CHF (congestive heart failure) (HCC)   Atrial flutter (HCC)   Symptomatic anemia   AV malformation of gastrointestinal tract    Discharge Condition: Improved.  Diet recommendation: Low sodium, heart healthy.  Carbohydrate-modified  Wound care: None.  Code status: Full.   History of Present Illness:  Jared [name] is a 64 y.o. 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  Patient states that he has noticed dark black appearing stool for about 1 month now.  He reports having 2-3 bowel movements per day.  He is on Xarelto for history of atrial flutter and last took it night of 10/26/2018.  He reports intermittent mild epigastric abdominal pain.  He reports chronic shortness of breath which is unchanged.  He reports becoming lightheaded after walking short distances without syncope or fall.  He denies any nausea, vomiting, hemoptysis, hematemesis, or hematuria.  He has not had any recent chest pain, palpitations, fevers, or peripheral edema.  He was recently admitted from 09/25/2018-09/28/2018 for symptomatic anemia and melena.  A capsule study was performed showing a duodenal AVM.  Active bleeding was noted on upper endoscopy on  09/30/2018.  He is stable hemoglobin, patient was discharged with recommendation to continue [facility] Hospital Course by Problem:   Acute on chronic anemia due to chronic upper GI bleeding, known duodenal AVM: Reports ongoing melena for the last month. Hemoglobin 7.4 on admission. -s/p 2 units PRBCs, his goal hemoglobin>8.0given cardiac history-- Hgb 8.4 -ContinueProtonix 40 mgq12h -s/p enteroscopy: - Single AVM located 100 cm from the incisors. Treated with APC. -IV Fe -resume xarelto  Atrial flutter: In sinus rhythm on admission with controlled rate. -resume Xarelto -Continue home Coreg  Chronic combined systolic and diastolic CHF: EF 44-03% by echocardiogram 05/02/2018. Appears well compensated. -Continue home Coreg, losartan, and oral Lasix  CAD: Chronic and stable, denies any chest pain. Continue home Coreg and atorvastatin  COPD on chronic 2-3 L supplemental O2: -Continue supplemental oxygen, home Breo Ellipta, and as needed albuterol and DuoNeb treatments  Hypertension: Stable. Continue home losartan and Coreg    Medical Consultants:      Discharge Exam:   Vitals:   10/28/18 1400 10/28/18 1410  BP: (!) 112/58 122/62  Pulse: 86 68  Resp: 12 19  Temp:    SpO2: 92% 100%   Vitals:   10/28/18 1153 10/28/18 1353 10/28/18 1400 10/28/18 1410  BP: 114/63 (!) 112/58 (!) 112/58 122/62  Pulse:  72 86 68  Resp: Temp: 98.3 F (36.8 C) 98.2 F (36.8 C)    TempSrc: Oral Oral    SpO2: 100% 93% 92% 100%  Weight: 88.5 kg     Height:  (1.905 m)  General exam: Appears calm and comfortable.    The results of significant diagnostics from this hospitalization (including imaging, microbiology, ancillary and laboratory) are listed below for reference.     Procedures and Diagnostic Studies:   Dg Chest Portable 1 View  Result Date: 10/27/2018 CLINICAL DATA:  Shortness of breath EXAM: PORTABLE CHEST 1 VIEW COMPARISON:  09/25/2018  FINDINGS: The cardiac size is increased. There is no pneumothorax. No large pleural effusion, however the costophrenic angles are not fully visualized bilaterally. Old left-sided rib fractures are noted. The lungs are somewhat hyperexpanded which can be seen in patients with COPD. Aortic calcifications are noted. IMPRESSION: 1. No acute cardiopulmonary process identified. 2. Cardiomegaly. 3. Mildly hyperexpanded lungs which can be seen in patients with COPD. Electronically Signed   By: Katherine Mantle M.D.   On: 10/27/2018 16:46     Labs:   Basic Metabolic Panel: Recent Labs  Lab 10/27/18 1635 10/28/18 0006  NA 144 142  K 4.1 4.4  CL 93* 96*  CO2 42* 39*  GLUCOSE 94 94  BUN 13 11  CREATININE 0.95 0.78  CALCIUM 8.6* 8.6*   GFR Estimated Creatinine Clearance: 110 mL/min (by C-G formula based on SCr of 0.78 mg/dL). Liver Function Tests: Recent Labs  Lab 10/27/18 1635  AST 25  ALT 15  ALKPHOS 62  BILITOT 0.4  PROT 6.8  ALBUMIN 3.7   No results for input(s): LIPASE, AMYLASE in the last 168 hours. No results for input(s): AMMONIA in the last 168 hours. Coagulation profile Recent Labs  Lab 10/27/18 1635  INR 1.3*    CBC: Recent Labs  Lab 10/27/18 0911 10/27/18 1635 10/28/18 0006 10/28/18 0936  WBC 8.3 11.2* 13.0*  --   NEUTROABS 6.1 7.0  --   --   HGB 7.7 Repeated and verified X2.* 7.4* 7.5* 8.7*  HCT 25.9 Repeated and verified X2.* 28.2* 26.9* 30.5*  MCV 87.2 95.6 92.1  --   PLT 247.0 253 217  --    Cardiac Enzymes: Recent Labs  Lab 10/27/18 1635  TROPONINI <0.03   BNP: Invalid input(s): POCBNP CBG: No results for input(s): GLUCAP in the last 168 hours. D-Dimer No results for input(s): DDIMER in the last 72 hours. Hgb A1c No results for input(s): HGBA1C in the last 72 hours. Lipid Profile No results for input(s): CHOL, HDL, LDLCALC, TRIG, CHOLHDL, LDLDIRECT in the last 72 hours. Thyroid function studies No results for input(s): TSH, T4TOTAL,  T3FREE, THYROIDAB in the last 72 hours.  Invalid input(s): FREET3 Anemia work up No results for input(s): VITAMINB12, FOLATE, FERRITIN, TIBC, IRON, RETICCTPCT in the last 72 hours. Microbiology Recent Results (from the past 240 hour(s))  SARS Coronavirus 2 (CEPHEID - Performed in [facility] hospital lab), Hosp Order     Status: None   Collection Time: 10/27/18  4:50 PM  Result Value Ref Range Status   SARS Coronavirus 2 NEGATIVE NEGATIVE Final    Comment: (NOTE) If result is NEGATIVE SARS-CoV-2 target nucleic acids are NOT DETECTED. The SARS-CoV-2 RNA is generally detectable in upper and lower  respiratory specimens during the acute phase of infection. The lowest  concentration of SARS-CoV-2 viral copies this assay can detect is 250  copies / mL. A negative result does not preclude SARS-CoV-2 infection  and should not be used as the sole basis for treatment or other  patient management decisions.  A negative result may occur with  improper specimen collection / handling, submission of specimen other  than nasopharyngeal swab,  presence of viral mutation(s) within the  areas targeted by this assay, and inadequate number of viral copies  (<250 copies / mL). A negative result must be combined with clinical  observations, patient history, and epidemiological information. If result is POSITIVE SARS-CoV-2 target nucleic acids are DETECTED. The SARS-CoV-2 RNA is generally detectable in upper and lower  respiratory specimens dur ing the acute phase of infection.  Positive  results are indicative of active infection with SARS-CoV-2.  Clinical  correlation with patient history and other diagnostic information is  necessary to determine patient infection status.  Positive results do  not rule out bacterial infection or co-infection with other viruses. If result is PRESUMPTIVE POSTIVE SARS-CoV-2 nucleic acids MAY BE PRESENT.   A presumptive positive result was obtained on the submitted  specimen  and confirmed on repeat testing.  While 2019 novel coronavirus  (SARS-CoV-2) nucleic acids may be present in the submitted sample  additional confirmatory testing may be necessary for epidemiological  and / or clinical management purposes  to differentiate between  SARS-CoV-2 and other Sarbecovirus currently known to infect humans.  If clinically indicated additional testing with an alternate test  methodology [phone] is advised. The SARS-CoV-2 RNA is generally  detectable in upper and lower respiratory sp ecimens during the acute  phase of infection. The expected result is Negative. Fact Sheet for Patients:  BoilerBrush.com.cyhttps://www.fda.gov/media/136312/download Fact Sheet for Healthcare Providers: https://pope.com/https://www.fda.gov/media/136313/download This test is not yet approved or cleared by the [place] FDA and has been authorized for detection and/or diagnosis of SARS-CoV-2 by FDA under an Emergency Use Authorization (EUA).  This EUA will remain in effect (meaning this test can be used) for the duration of the COVID-19 declaration under Section 564(b)(1) of the Act, 21 U.S.C. section 360bbb-3(b)(1), unless the authorization is terminated or revoked sooner. Performed at [facility], [address] [place], [place] [address]      Discharge Instructions:    Allergies as of 10/28/2018      Reactions   Lisinopril Cough      Medication List    TAKE these medications   albuterol 108 (90 Base) MCG/ACT inhaler Commonly known as:  VENTOLIN HFA Inhale 2 puffs into the lungs every 6 (six) hours as needed for wheezing or shortness of breath.   atorvastatin 80 MG tablet Commonly known as:  LIPITOR Take 1 tablet (80 mg total) by mouth daily.   carvedilol 3.125 MG tablet Commonly known as:  Coreg Take 1 tablet (3.125 mg total) by mouth 2 (two) times daily.   cetirizine 10 MG tablet Commonly known as:  ZYRTEC Take 1 tablet (10 mg total) by mouth daily.   ferrous sulfate 325  (65 FE) MG tablet Take 1 tablet (325 mg total) by mouth daily with breakfast.   fluticasone furoate-vilanterol 100-25 MCG/INH Aepb Commonly known as:  Breo Ellipta Inhale 1 puff into the lungs daily.   furosemide 40 MG tablet Commonly known as:  Lasix Take 1 tablet (40 mg total) by mouth daily.   gabapentin 300 MG capsule Commonly known as:  NEURONTIN Take 1 capsule (300 mg total) by mouth at bedtime.   ipratropium-albuterol 0.5-2.5 (3) MG/3ML Soln Commonly known as:  DUONEB Take 3 mLs by nebulization every 6 (six) hours as needed. What changed:  reasons to take this   losartan 50 MG tablet Commonly known as:  COZAAR Take 1 tablet (50 mg total) by mouth daily.   pantoprazole 40 MG tablet Commonly known as:  PROTONIX Take  1 tablet (40 mg total) by mouth daily at 6 (six) AM.   rivaroxaban 20 MG Tabs tablet Commonly known as:  XARELTO Take 20 mg by mouth daily with supper.      Follow-up Information    [name], MD Follow up in 1 week(s).   Specialty:  Family Medicine Why:  cbc Contact information: [address] [place] [address] [phone]            Time coordinating discharge: 25 min  Signed:  Joseph Art DO  [facility] 10/28/2018, 2:46 PM

## 2018-10-28 NOTE — Progress Notes (Signed)
Progress Note    Jared [name]  MMN:[id number] DOB: 1955-10-01  DOA: 10/27/2018 PCP: [name], MD    Brief Narrative:     Medical records reviewed and are as summarized below:  Jared [name] is an 63 y.o. 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   Assessment/Plan:   Principal Problem:   GI bleed Active Problems:   Essential hypertension   CAD (coronary artery disease)   COPD GOLD III/still smoking    Chronic combined systolic and diastolic CHF (congestive heart failure) (HCC)   Atrial flutter (HCC)   Symptomatic anemia   AV malformation of gastrointestinal tract  Acute on chronic anemia due to chronic upper GI bleeding, known duodenal AVM: Reports ongoing melena for the last month.  Hemoglobin 7.4 on admission. -s/p 2 units PRBCs, his goal hemoglobin >8.0 given cardiac history-- Hgb 8.4 -Continue IV Protonix 40 mg q12h -Keep n.p.o. -Hold Xarelto -GI plans for enteroscopy today  Atrial flutter: In sinus rhythm on admission with controlled rate. -Hold home Xarelto -Continue home Coreg  Chronic combined systolic and diastolic CHF: EF 33-38% by echocardiogram 05/02/2018.  Appears well compensated. -Continue home Coreg, losartan, and oral Lasix  CAD: Chronic and stable, denies any chest pain.  Continue home Coreg and atorvastatin.  Holding Xarelto as above.  COPD on chronic 3 L supplemental O2: -Continue supplemental oxygen, home Breo Ellipta, and as needed albuterol and DuoNeb treatments  Hypertension: Stable.  Continue home losartan and Coreg.   Family Communication/Anticipated D/C date and plan/Code Status   DVT prophylaxis: scd Code Status: Full Code.  Family  Communication:  Disposition Plan: pending GI evaluation   Medical Consultants:   GI   Subjective:   Hungry No SOB worse than baseline (wears 3L O2)  Objective:    Vitals:   10/28/18 0606 10/28/18 0754 10/28/18 0814 10/28/18 0905  BP: 118/71 116/65 119/68   Pulse: 85 79 77   Resp: 20 15 14    Temp: 98.1 F (36.7 C) 98.5 F (36.9 C) (!) 97.5 F (36.4 C)   TempSrc: Oral Oral Axillary   SpO2: 100% 99% 100% 100%  Weight:      Height:        Intake/Output Summary (Last 24 hours) at 10/28/2018 1039 Last data filed at 10/28/2018 0814 Gross per 24 hour  Intake 460.28 ml  Output 400 ml  Net 60.28 ml   Filed Weights   10/27/18 2047 10/28/18 0401  Weight: 87.9 kg 88.5 kg    Exam: In bed, NAD rrr No increased work of breathing, lungs diminished, no wheezing-- wears 3L O2 at home No LE edema A+Ox3  Data Reviewed:   I have personally reviewed following labs and imaging studies:  Labs: Labs show the following:   Basic Metabolic Panel: Recent Labs  Lab 10/27/18 1635 10/28/18 0006  NA 144 142  K 4.1 4.4  CL 93* 96*  CO2 42* 39*  GLUCOSE 94 94  BUN 13 11  CREATININE 0.95 0.78  CALCIUM 8.6* 8.6*   GFR Estimated Creatinine Clearance: 110 mL/min (by C-G formula based on SCr of 0.78 mg/dL). Liver Function Tests: Recent Labs  Lab 10/27/18 1635  AST 25  ALT 15  ALKPHOS 62  BILITOT 0.4  PROT 6.8  ALBUMIN 3.7   No results for input(s): LIPASE, AMYLASE in the last 168 hours. No results for input(s): AMMONIA in the last 168 hours. Coagulation profile Recent Labs  Lab 10/27/18 1635  INR 1.3*    CBC: Recent Labs  Lab 10/27/18 0911 10/27/18 1635 10/28/18 0006 10/28/18 0936  WBC 8.3 11.2* 13.0*  --   NEUTROABS 6.1 7.0  --   --   HGB 7.7 Repeated and verified X2.* 7.4* 7.5* 8.7*  HCT 25.9 Repeated and verified X2.* 28.2* 26.9* 30.5*  MCV 87.2 95.6 92.1  --   PLT 247.0 253 217  --    Cardiac Enzymes: Recent Labs  Lab 10/27/18 1635  TROPONINI  <0.03   BNP (last 3 results) No results for input(s): PROBNP in the last 8760 hours. CBG: No results for input(s): GLUCAP in the last 168 hours. D-Dimer: No results for input(s): DDIMER in the last 72 hours. Hgb A1c: No results for input(s): HGBA1C in the last 72 hours. Lipid Profile: No results for input(s): CHOL, HDL, LDLCALC, TRIG, CHOLHDL, LDLDIRECT in the last 72 hours. Thyroid function studies: No results for input(s): TSH, T4TOTAL, T3FREE, THYROIDAB in the last 72 hours.  Invalid input(s): FREET3 Anemia work up: No results for input(s): VITAMINB12, FOLATE, FERRITIN, TIBC, IRON, RETICCTPCT in the last 72 hours. Sepsis Labs: Recent Labs  Lab 10/27/18 0911 10/27/18 1635 10/28/18 0006  WBC 8.3 11.2* 13.0*    Microbiology Recent Results (from the past 240 hour(s))  SARS Coronavirus 2 (CEPHEID - Performed in [facility] lab), Hosp Order     Status: None   Collection Time: 10/27/18  4:50 PM  Result Value Ref Range Status   SARS Coronavirus 2 NEGATIVE NEGATIVE Final    Comment: (NOTE) If result is NEGATIVE SARS-CoV-2 target nucleic acids are NOT DETECTED. The SARS-CoV-2 RNA is generally detectable in upper and lower  respiratory specimens during the acute phase of infection. The lowest  concentration of SARS-CoV-2 viral copies this assay can detect is 250  copies / mL. A negative result does not preclude SARS-CoV-2 infection  and should not be used as the sole basis for treatment or other  patient management decisions.  A negative result may occur with  improper specimen collection / handling, submission of specimen other  than nasopharyngeal swab, presence of viral mutation(s) within the  areas targeted by this assay, and inadequate number of viral copies  (<250 copies / mL). A negative result must be combined with clinical  observations, patient history, and epidemiological information. If result is POSITIVE SARS-CoV-2 target nucleic acids are DETECTED. The  SARS-CoV-2 RNA is generally detectable in upper and lower  respiratory specimens dur ing the acute phase of infection.  Positive  results are indicative of active infection with SARS-CoV-2.  Clinical  correlation with patient history and other diagnostic information is  necessary to determine patient infection status.  Positive results do  not rule out bacterial infection or co-infection with other viruses. If result is PRESUMPTIVE POSTIVE SARS-CoV-2 nucleic acids MAY BE PRESENT.   A presumptive positive result was obtained on the submitted specimen  and confirmed on repeat testing.  While 2019 novel coronavirus  (SARS-CoV-2) nucleic acids may be present in the submitted sample  additional confirmatory testing may be necessary for epidemiological  and / or clinical management purposes  to differentiate between  SARS-CoV-2 and other Sarbecovirus currently known to infect humans.  If clinically indicated additional testing with an alternate test  methodology [phone]) is advised. The SARS-CoV-2 RNA is generally  detectable in upper and lower respiratory sp ecimens during the acute  phase of infection. The expected result is Negative. Fact Sheet for Patients:  BoilerBrush.com.cy Fact Sheet for Healthcare Providers: https://pope.com/ This test is not yet approved or cleared by the [place] FDA and has been authorized for detection and/or diagnosis of SARS-CoV-2 by FDA under an Emergency Use Authorization (EUA).  This EUA will remain in effect (meaning this test can be used) for the duration of the COVID-19 declaration under Section 564(b)(1) of the Act, 21 U.S.C. section 360bbb-3(b)(1), unless the authorization is terminated or revoked sooner. Performed at [facility], [address] [place], [place] [address]     Procedures and diagnostic studies:  Dg Chest Portable 1 View  Result Date: 10/27/2018 CLINICAL DATA:   Shortness of breath EXAM: PORTABLE CHEST 1 VIEW COMPARISON:  09/25/2018 FINDINGS: The cardiac size is increased. There is no pneumothorax. No large pleural effusion, however the costophrenic angles are not fully visualized bilaterally. Old left-sided rib fractures are noted. The lungs are somewhat hyperexpanded which can be seen in patients with COPD. Aortic calcifications are noted. IMPRESSION: 1. No acute cardiopulmonary process identified. 2. Cardiomegaly. 3. Mildly hyperexpanded lungs which can be seen in patients with COPD. Electronically Signed   By: Katherine Mantle M.D.   On: 10/27/2018 16:46    Medications:   . atorvastatin  80 mg Oral Daily  . carvedilol  3.125 mg Oral BID  . fluticasone furoate-vilanterol  1 puff Inhalation Daily  . furosemide  40 mg Oral Daily  . losartan  50 mg Oral Daily  . nicotine  21 mg Transdermal Daily  . [START ON 10/29/2018] pantoprazole  40 mg Oral Daily  . sodium chloride flush  3 mL Intravenous Q12H   Continuous Infusions: . ferumoxytol       LOS: 1 day   Joseph Art  [facility]   How to contact the [facility] Attending or Consulting provider 7A - 7P or covering provider during after hours 7P -7A, for this patient?  1. Check the care team in [facility] and look for a) attending/consulting [facility] provider listed and b) the [facility] team listed 2. Log into [email or web address] and use [place]'s universal password to access. If you do not have the password, please contact the hospital operator. 3. Locate the [facility] provider you are looking for under [facility] and page to a number that you can be directly reached. 4. If you still have difficulty reaching the provider, please page the [facility] (Director on Call) for the Hospitalists listed on amion for assistance.  10/28/2018, 10:39 AM

## 2018-10-28 NOTE — Progress Notes (Signed)
ANTICOAGULATION CONSULT NOTE - Initial Consult  Pharmacy Consult for xarelto Indication: atrial fibrillation  Allergies  Allergen Reactions  . Lisinopril Cough    Patient Measurements: Height: 6\' 3"  (190.5 cm) Weight: 195 lb (88.5 kg) IBW/kg (Calculated) : 84.5   Vital Signs: Temp: 98.2 F (36.8 C) (05/19 1353) Temp Source: Oral (05/19 1353) BP: 122/62 (05/19 1410) Pulse Rate: 68 (05/19 1410)  Labs: Recent Labs    10/27/18 0911 10/27/18 1635 10/28/18 0006 10/28/18 0936  HGB 7.7 Repeated and verified X2.* 7.4* 7.5* 8.7*  HCT 25.9 Repeated and verified X2.* 28.2* 26.9* 30.5*  PLT 247.0 253 217  --   LABPROT  --  16.1*  --   --   INR  --  1.3*  --   --   CREATININE  --  0.95 0.78  --   TROPONINI  --  <0.03  --   --     Estimated Creatinine Clearance: 110 mL/min (by C-G formula based on SCr of 0.78 mg/dL).   Medical History: Past Medical History:  Diagnosis Date  . CAD (coronary artery disease)    a. LHC 5/12:  LAD 20, pLCx 20, pRCA 40, dRCA 40, EF 35%, diff HK  //  b. Myoview 4/16: Overall Impression: High risk stress nuclear study There is no evidence of ischemia. There is severe LV dysfunction. LV Ejection Fraction: 30%. LV Wall Motion: There is global LV hypokinesis.   [place] Kitchen CAP (community acquired pneumonia) 09/2013  . Chronic combined systolic and diastolic CHF (congestive heart failure) (HCC)    a. Echo 4/16:Mild LVH, EF 40-45%, diffuse HK //  b. Echo 8/17: EF 35-40%, diffuse HK, diastolic dysfunction, aortic sclerosis, trivial MR, moderate LAE, normal RVSF, moderate RAE, mild TR, PASP 42 mmHg // c. Echo 4/18: Mild concentric LVH, EF 30-35, normal wall motion, grade 1 diastolic dysfunction, PASP 49  . Cluster headache    "hx; haven't had one in awhile" (01/09/2016)  . COPD (chronic obstructive pulmonary disease) (HCC)    [name] 01/09/2016  . History of CVA (cerebrovascular accident)   . Hypertension   . NICM (nonischemic cardiomyopathy) (HCC)   . Tobacco  abuse     Medications:  Medications Prior to Admission  Medication Sig Dispense Refill Last Dose  . albuterol (PROVENTIL HFA;VENTOLIN HFA) 108 (90 Base) MCG/ACT inhaler Inhale 2 puffs into the lungs every 6 (six) hours as needed for wheezing or shortness of breath. 1 Inhaler 5 10/27/2018 at Unknown time  . atorvastatin (LIPITOR) 80 MG tablet Take 1 tablet (80 mg total) by mouth daily. 30 tablet 0 10/27/2018 at Unknown time  . carvedilol (COREG) 3.125 MG tablet Take 1 tablet (3.125 mg total) by mouth 2 (two) times daily. 60 tablet 6 10/27/2018 at 0600  . cetirizine (ZYRTEC) 10 MG tablet Take 1 tablet (10 mg total) by mouth daily. 30 tablet 1 10/27/2018 at Unknown time  . ferrous sulfate 325 (65 FE) MG tablet Take 1 tablet (325 mg total) by mouth daily with breakfast. 60 tablet 1 10/27/2018 at Unknown time  . fluticasone furoate-vilanterol (BREO ELLIPTA) 100-25 MCG/INH AEPB Inhale 1 puff into the lungs daily. 60 each 6 10/27/2018 at Unknown time  . furosemide (LASIX) 40 MG tablet Take 1 tablet (40 mg total) by mouth daily. 30 tablet 3 10/27/2018 at Unknown time  . gabapentin (NEURONTIN) 300 MG capsule Take 1 capsule (300 mg total) by mouth at bedtime. 30 capsule 3 10/26/2018 at Unknown time  . ipratropium-albuterol (DUONEB) 0.5-2.5 (3) MG/3ML SOLN Take  3 mLs by nebulization every 6 (six) hours as needed. (Patient taking differently: Take 3 mLs by nebulization every 6 (six) hours as needed (SOB). ) 120 mL 3 Past Week at Unknown time  . losartan (COZAAR) 50 MG tablet Take 1 tablet (50 mg total) by mouth daily. 30 tablet 6 10/26/2018 at Unknown time  . pantoprazole (PROTONIX) 40 MG tablet Take 1 tablet (40 mg total) by mouth daily at 6 (six) AM. 30 tablet 0 10/26/2018 at Unknown time  . rivaroxaban (XARELTO) 20 MG TABS tablet Take 20 mg by mouth daily with supper.   10/26/2018 at 2100    Assessment: 63 yo male on xarelto PTA for afib. Anticoagulation has been on hold for chronic GIB. He is now s/p endoscopy and  single AVM found otherwise normal. Pharmacy consulted to restart xarelto (he was on 20mg  po daily PTA) -Hg= 8.7, SCr= 0.78  Goal of Therapy:  Monitor platelets by anticoagulation protocol: Yes   Plan:  -Resume xarelto 20mg  po daily -Will follow patient progress  Harland German, PharmD Clinical Pharmacist **Pharmacist phone directory can now be found on [email or web address] ([email or web address]).  Listed under [facility].

## 2018-10-29 ENCOUNTER — Encounter (HOSPITAL_COMMUNITY): Payer: Self-pay | Admitting: Gastroenterology

## 2018-10-29 LAB — TYPE AND SCREEN
ABO/RH(D): O POS
Antibody Screen: NEGATIVE
Unit division: 0
Unit division: 0

## 2018-10-29 LAB — BPAM RBC
Blood Product Expiration Date: 202006132359
Blood Product Expiration Date: 202006152359
ISSUE DATE / TIME: 202005181915
ISSUE DATE / TIME: 202005190538
Unit Type and Rh: 5100
Unit Type and Rh: 5100

## 2018-11-03 ENCOUNTER — Other Ambulatory Visit: Payer: Self-pay

## 2018-11-03 ENCOUNTER — Emergency Department (HOSPITAL_COMMUNITY)
Admission: EM | Admit: 2018-11-03 | Discharge: 2018-11-03 | Disposition: A | Discharge status: Home / Self Care | Payer: Medicare HMO | Attending: Emergency Medicine | Admitting: Emergency Medicine

## 2018-11-03 DIAGNOSIS — I5042 Chronic combined systolic (congestive) and diastolic (congestive) heart failure: Secondary | ICD-10-CM | POA: Diagnosis not present

## 2018-11-03 DIAGNOSIS — Z7901 Long term (current) use of anticoagulants: Secondary | ICD-10-CM | POA: Insufficient documentation

## 2018-11-03 DIAGNOSIS — F17210 Nicotine dependence, cigarettes, uncomplicated: Secondary | ICD-10-CM | POA: Diagnosis not present

## 2018-11-03 DIAGNOSIS — I2510 Atherosclerotic heart disease of native coronary artery without angina pectoris: Secondary | ICD-10-CM | POA: Insufficient documentation

## 2018-11-03 DIAGNOSIS — J449 Chronic obstructive pulmonary disease, unspecified: Secondary | ICD-10-CM | POA: Diagnosis not present

## 2018-11-03 DIAGNOSIS — R040 Epistaxis: Secondary | ICD-10-CM

## 2018-11-03 DIAGNOSIS — I110 Hypertensive heart disease with heart failure: Secondary | ICD-10-CM | POA: Insufficient documentation

## 2018-11-03 DIAGNOSIS — Z79899 Other long term (current) drug therapy: Secondary | ICD-10-CM | POA: Diagnosis not present

## 2018-11-03 LAB — CBC
HCT: 31.7 % — ABNORMAL LOW (ref 39.0–52.0)
Hemoglobin: 8.6 g/dL — ABNORMAL LOW (ref 13.0–17.0)
MCH: 26.4 pg (ref 26.0–34.0)
MCHC: 27.1 g/dL — ABNORMAL LOW (ref 30.0–36.0)
MCV: 97.2 fL (ref 80.0–100.0)
Platelets: 308 10*3/uL (ref 150–400)
RBC: 3.26 MIL/uL — ABNORMAL LOW (ref 4.22–5.81)
RDW: 15.4 % (ref 11.5–15.5)
WBC: 6.3 10*3/uL (ref 4.0–10.5)
nRBC: 0 % (ref 0.0–0.2)

## 2018-11-03 LAB — BASIC METABOLIC PANEL
Anion gap: 9 (ref 5–15)
BUN: 11 mg/dL (ref 8–23)
CO2: 38 mmol/L — ABNORMAL HIGH (ref 22–32)
Calcium: 8.5 mg/dL — ABNORMAL LOW (ref 8.9–10.3)
Chloride: 96 mmol/L — ABNORMAL LOW (ref 98–111)
Creatinine, Ser: 0.84 mg/dL (ref 0.61–1.24)
GFR calc Af Amer: >60 mL/min (ref >60)
GFR calc non Af Amer: >60 mL/min (ref >60)
Glucose, Bld: 141 mg/dL — ABNORMAL HIGH (ref 70–99)
Potassium: 3.7 mmol/L (ref 3.5–5.1)
Sodium: 143 mmol/L (ref 135–145)

## 2018-11-03 NOTE — ED Notes (Signed)
Patient verbalizes understanding of discharge instructions. Opportunity for questioning and answers were provided. Armband removed by staff, pt discharged from ED.  

## 2018-11-03 NOTE — ED Triage Notes (Addendum)
Pt here for nose bleeds x3 days. Any time he blows his nose it bleeds and he can get it to stop after messing with it for a while. Pt on xarelto for hx of strokes per pt.

## 2018-11-03 NOTE — Discharge Instructions (Addendum)
It was our pleasure to provide your ER care today - we hope that you feel better.  Your blood count/hemoglobin appears similar to your prior test results.   Avoid blowing nose or rubbing nose as much as possible for the next few days. Consider using saline nasal drops and/or humidifier with your home oxygen, to help keep nasal mucosa moist and decrease change of recurrent nose-bleeding.    If bleeding recurs, hold direct pressure/pinch nose for 10 minutes without letting up - if bleeding persists, return for recheck.  Hold your xarelto medication for the next 2 days. Follow up with primary care doctor in the next 1-2 days - given recent gi bleeding, recurrent nosebleeding, and that you have had normal heart rhythm for past several months - discuss with primary care doctor whether or not they want to re-start your blood thinner.   For recurrent nosebleed, follow up with ENT doctor in the coming week - call office tomorrow to arrange appointment.   Return to ER if worse, new symptoms, persistent bleeding, trouble breathing, other concern.

## 2018-11-03 NOTE — ED Provider Notes (Signed)
[facility] EMERGENCY DEPARTMENT Provider Note   CSN: [id number] Arrival date & time: 11/03/18  2924    History   Chief Complaint Chief Complaint  Patient presents with  . Epistaxis    HPI Jared [name] is a 63 y.o. male.     Patient presents c/o intermittent nosebleeding in past 3 days, especially after he blows nose/rubs nose. symptoms acute onset, episodic, mild, stops w pressure. Patient with hx copd, uses home o2 3 liters. Hx afib, on xarelto. Pt denies other current abnormal bruising or bleeding - no rectal bleeding or melena, no hematemesis, no hematuria. Denies faintness or dizziness. No sob or chest discomfort. Denies cough or uri symptoms. No fevers.   The history is provided by the patient.  Epistaxis  Associated symptoms: no cough, no dizziness, no fever, no headaches and no sore throat     Past Medical History:  Diagnosis Date  . CAD (coronary artery disease)    a. LHC 5/12:  LAD 20, pLCx 20, pRCA 40, dRCA 40, EF 35%, diff HK  //  b. Myoview 4/16: Overall Impression: High risk stress nuclear study There is no evidence of ischemia. There is severe LV dysfunction. LV Ejection Fraction: 30%. LV Wall Motion: There is global LV hypokinesis.   Marland Kitchen CAP (community acquired pneumonia) 09/2013  . Chronic combined systolic and diastolic CHF (congestive heart failure) (HCC)    a. Echo 4/16:Mild LVH, EF 40-45%, diffuse HK //  b. Echo 8/17: EF 35-40%, diffuse HK, diastolic dysfunction, aortic sclerosis, trivial MR, moderate LAE, normal RVSF, moderate RAE, mild TR, PASP 42 mmHg // c. Echo 4/18: Mild concentric LVH, EF 30-35, normal wall motion, grade 1 diastolic dysfunction, PASP 49  . Cluster headache    "hx; haven't had one in awhile" (01/09/2016)  . COPD (chronic obstructive pulmonary disease) (HCC)    [name] 01/09/2016  . History of CVA (cerebrovascular accident)   . Hypertension   . NICM (nonischemic cardiomyopathy) (HCC)   . Tobacco abuse     Patient  Active Problem List   Diagnosis Date Noted  . GI bleed 10/27/2018  . Chronic anticoagulation 09/27/2018  . AV malformation of gastrointestinal tract   . Occult GI bleeding   . Heme positive stool   . Iron deficiency anemia   . Symptomatic anemia 09/04/2018  . Tobacco use disorder 09/04/2018  . Noncompliance 06/14/2017  . Elevated LFTs 08/13/2016  . Chronic respiratory failure with hypoxia (HCC) 07/24/2016  . Atrial flutter (HCC)   . Hepatic congestion 07/02/2016  . Chronic combined systolic and diastolic CHF (congestive heart failure) (HCC) 07/01/2016  . COPD with acute exacerbation (HCC) 06/03/2016  . Prediabetes 05/14/2016  . Hemoptysis 05/06/2016  . COPD GOLD III/still smoking  02/29/2016  . Cigarette smoker 01/09/2016  . CAD (coronary artery disease) 10/07/2013  . 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 Centrilobular emphysema (HCC) 10/04/2013  . Pulmonary infiltrate in right lung on CXR 10/03/2013  . Acute on chronic respiratory failure with hypoxia (HCC) 10/03/2013  . Essential hypertension     Past Surgical History:  Procedure Laterality Date  . CARDIAC CATHETERIZATION  10/2010   LM normal, LAD with 20% irregularities, LCX with 20%, RCA with 40% prox and 40% distal - EF of 35%  . COLONOSCOPY W/ BIOPSIES AND POLYPECTOMY    . COLONOSCOPY WITH PROPOFOL N/A 09/06/2018   Procedure: COLONOSCOPY WITH PROPOFOL;  Surgeon: Tressia Danas, MD;  Location: [facility] ENDOSCOPY;  Service: Gastroenterology;  Laterality: N/A;  . ENTEROSCOPY N/A  09/28/2018   Procedure: ENTEROSCOPY;  Surgeon: Jeani Hawking, MD;  Location: [facility] ENDOSCOPY;  Service: Endoscopy;  Laterality: N/A;  . ENTEROSCOPY N/A 10/28/2018   Procedure: ENTEROSCOPY;  Surgeon: Tressia Danas, MD;  Location: [facility] ENDOSCOPY;  Service: Gastroenterology;  Laterality: N/A;  . ESOPHAGOGASTRODUODENOSCOPY (EGD) WITH PROPOFOL N/A 09/05/2018   Procedure: ESOPHAGOGASTRODUODENOSCOPY (EGD) WITH PROPOFOL;  Surgeon: Benancio Deeds, MD;  Location: [facility] ENDOSCOPY;  Service: Gastroenterology;  Laterality: N/A;  . EXCISION MASS HEAD    . GIVENS CAPSULE STUDY N/A 09/06/2018   Procedure: GIVENS CAPSULE STUDY;  Surgeon: Tressia Danas, MD;  Location: [facility] ENDOSCOPY;  Service: Gastroenterology;  Laterality: N/A;  . GIVENS CAPSULE STUDY N/A 09/26/2018   Procedure: GIVENS CAPSULE STUDY;  Surgeon: Beverley Fiedler, MD;  Location: [facility] ENDOSCOPY;  Service: Gastroenterology;  Laterality: N/A;  . HOT HEMOSTASIS N/A 10/28/2018   Procedure: HOT HEMOSTASIS (ARGON PLASMA COAGULATION/BICAP);  Surgeon: Tressia Danas, MD;  Location: [facility] ENDOSCOPY;  Service: Gastroenterology;  Laterality: N/A;  . INCISION AND DRAINAGE PERIRECTAL ABSCESS N/A 06/05/2017   Procedure: IRRIGATION AND DEBRIDEMENT PERIRECTAL ABSCESS;  Surgeon: Andria Meuse, MD;  Location: [facility] OR;  Service: General;  Laterality: N/A;  . VIDEO BRONCHOSCOPY Bilateral 05/08/2016   Procedure: VIDEO BRONCHOSCOPY WITH FLUORO;  Surgeon: Oretha Milch, MD;  Location: [facility] ENDOSCOPY;  Service: Cardiopulmonary;  Laterality: Bilateral;        Home Medications    Prior to Admission medications   Medication Sig Start Date End Date Taking? Authorizing Provider  albuterol (PROVENTIL HFA;VENTOLIN HFA) 108 (90 Base) MCG/ACT inhaler Inhale 2 puffs into the lungs every 6 (six) hours as needed for wheezing or shortness of breath. 05/15/18   [name], MD  atorvastatin (LIPITOR) 80 MG tablet Take 1 tablet (80 mg total) by mouth daily. 09/06/18   Tressia Danas, MD  carvedilol (COREG) 3.125 MG tablet Take 1 tablet (3.125 mg total) by mouth 2 (two) times daily. 05/15/18 05/15/19  [name], MD  cetirizine (ZYRTEC) 10 MG tablet Take 1 tablet (10 mg total) by mouth daily. 06/18/18   [name], MD  ferrous sulfate 325 (65 FE) MG tablet Take 1 tablet (325 mg total) by mouth daily with breakfast. 09/09/18   Danis, Andreas Blower, MD  fluticasone furoate-vilanterol (BREO ELLIPTA) 100-25  MCG/INH AEPB Inhale 1 puff into the lungs daily. 05/15/18   [name], MD  furosemide (LASIX) 40 MG tablet Take 1 tablet (40 mg total) by mouth daily. 05/16/18 05/16/19  [name], MD  gabapentin (NEURONTIN) 300 MG capsule Take 1 capsule (300 mg total) by mouth at bedtime. 06/18/18   [name], MD  ipratropium-albuterol (DUONEB) 0.5-2.5 (3) MG/3ML SOLN Take 3 mLs by nebulization every 6 (six) hours as needed. Patient taking differently: Take 3 mLs by nebulization every 6 (six) hours as needed (SOB).  09/25/18   [name], MD  losartan (COZAAR) 50 MG tablet Take 1 tablet (50 mg total) by mouth daily. 05/15/18   [name], MD  pantoprazole (PROTONIX) 40 MG tablet Take 1 tablet (40 mg total) by mouth daily at 6 (six) AM. 09/07/18   Glade Lloyd, MD  rivaroxaban (XARELTO) 20 MG TABS tablet Take 20 mg by mouth daily with supper.    [provider]    Family History Family History  Problem Relation Age of Onset  . Heart disease Mother   . Diabetes Mother   . Colon cancer Mother   . Liver cancer Mother   . Cancer Father  type unknown  . Diabetes Sister        x 2  . Diabetes Brother     Social History Social History   Tobacco Use  . Smoking status: Current Every Day Smoker    Packs/day: 0.25    Years: 42.00    Pack years: 10.50    Types: Cigarettes  . Smokeless tobacco: Never Used  . Tobacco comment: 3 cigs daily  Substance Use Topics  . Alcohol use: Yes    Alcohol/week: 0.0 standard drinks    Comment: last drink was before xmas  . Drug use: No    Types: Cocaine, Marijuana    Comment: "nothing in 20 years"     Allergies   Lisinopril   Review of Systems Review of Systems  Constitutional: Negative for fever.  HENT: Positive for nosebleeds. Negative for sore throat.   Eyes: Negative for redness.  Respiratory: Negative for cough and shortness of breath.   Cardiovascular: Negative for chest pain.  Gastrointestinal: Negative for  abdominal pain, blood in stool and vomiting.  Genitourinary: Negative for flank pain and hematuria.  Musculoskeletal: Negative for neck pain.  Skin: Negative for rash.  Neurological: Negative for dizziness, light-headedness and headaches.  Hematological:       On xarelto.   Psychiatric/Behavioral: Negative for confusion.     Physical Exam Updated Vital Signs BP 116/69   Pulse 94   Temp 99 F (37.2 C) (Oral)   SpO2 100%   Physical Exam Vitals signs and nursing note reviewed.  Constitutional:      Appearance: Normal appearance. He is well-developed.  HENT:     Head: Atraumatic.     Nose: Nose normal.     Comments: Minimal dried blood anterior nares. No active bleeding.     Mouth/Throat:     Mouth: Mucous membranes are moist.     Pharynx: Oropharynx is clear.     Comments: No bleeding noted.  Eyes:     General: No scleral icterus.    Conjunctiva/sclera: Conjunctivae normal.  Neck:     Musculoskeletal: Normal range of motion and neck supple. No neck rigidity.     Trachea: No tracheal deviation.  Cardiovascular:     Rate and Rhythm: Normal rate and regular rhythm.     Pulses: Normal pulses.     Heart sounds: Normal heart sounds. No murmur. No friction rub. No gallop.   Pulmonary:     Effort: Pulmonary effort is normal. No accessory muscle usage or respiratory distress.     Breath sounds: Normal breath sounds.  Abdominal:     General: There is no distension.     Palpations: Abdomen is soft.     Tenderness: There is no abdominal tenderness.  Genitourinary:    Comments: No cva tenderness. Musculoskeletal:        General: No swelling.  Skin:    General: Skin is warm and dry.     Findings: No rash.  Neurological:     Mental Status: He is alert.     Comments: Alert, speech clear.   Psychiatric:        Mood and Affect: Mood normal.      ED Treatments / Results  Labs (all labs ordered are listed, but only abnormal results are displayed) Results for orders placed  or performed during the hospital encounter of 11/03/18  CBC  Result Value Ref Range   WBC 6.3 4.0 - 10.5 K/uL   RBC 3.26 (L) 4.22 - 5.81 MIL/uL  Hemoglobin 8.6 (L) 13.0 - 17.0 g/dL   HCT 10.2 (L) 72.5 - 36.6 %   MCV 97.2 80.0 - 100.0 fL   MCH 26.4 26.0 - 34.0 pg   MCHC 27.1 (L) 30.0 - 36.0 g/dL   RDW 44.0 34.7 - 42.5 %   Platelets 308 150 - 400 K/uL   nRBC 0.0 0.0 - 0.2 %  Basic metabolic panel  Result Value Ref Range   Sodium 143 135 - 145 mmol/L   Potassium 3.7 3.5 - 5.1 mmol/L   Chloride 96 (L) 98 - 111 mmol/L   CO2 38 (H) 22 - 32 mmol/L   Glucose, Bld 141 (H) 70 - 99 mg/dL   BUN 11 8 - 23 mg/dL   Creatinine, Ser 9.56 0.61 - 1.24 mg/dL   Calcium 8.5 (L) 8.9 - 10.3 mg/dL   GFR calc non Af Amer >60 >60 mL/min   GFR calc Af Amer >60 >60 mL/min   Anion gap 9 5 - 15   Dg Chest Portable 1 View  Result Date: 10/27/2018 CLINICAL DATA:  Shortness of breath EXAM: PORTABLE CHEST 1 VIEW COMPARISON:  09/25/2018 FINDINGS: The cardiac size is increased. There is no pneumothorax. No large pleural effusion, however the costophrenic angles are not fully visualized bilaterally. Old left-sided rib fractures are noted. The lungs are somewhat hyperexpanded which can be seen in patients with COPD. Aortic calcifications are noted. IMPRESSION: 1. No acute cardiopulmonary process identified. 2. Cardiomegaly. 3. Mildly hyperexpanded lungs which can be seen in patients with COPD. Electronically Signed   By: Katherine Mantle M.D.   On: 10/27/2018 16:46    EKG None  Radiology No results found.  Procedures Procedures (including critical care time)  Medications Ordered in ED Medications - No data to display   Initial Impression / Assessment and Plan / ED Course  I have reviewed the triage vital signs and the nursing notes.  Pertinent labs & imaging results that were available during my care of the patient were reviewed by me and considered in my medical decision making (see chart for details).   Labs sent.   Reviewed nursing notes and prior charts for additional history.   Pt with recent admission re gi bleeding - was kept on xarelto. Patient also noted to be in sinus rhythm for past many months/yrs. Given current recurrent nosebleeding, will hold xarelto, and have f/u pcp 1-2 days to discuss whether to re-start xarelto.  Discussed benefit of using humidified o2, nasal saline drops, avoiding smoking.  Given recurrent nosebleeds also rec ent f/u.   Labs reviewed by me - hgb c/w baseline.  Recheck pt, no active bleeding.  Patient appears stable for d/c.     Final Clinical Impressions(s) / ED Diagnoses   Final diagnoses:  None    ED Discharge Orders    None       Cathren Laine, MD 11/03/18 1106

## 2018-11-04 ENCOUNTER — Other Ambulatory Visit: Payer: Self-pay | Admitting: Family Medicine

## 2018-11-04 DIAGNOSIS — I5042 Chronic combined systolic (congestive) and diastolic (congestive) heart failure: Secondary | ICD-10-CM

## 2018-11-04 DIAGNOSIS — I110 Hypertensive heart disease with heart failure: Secondary | ICD-10-CM

## 2018-11-04 MED FILL — CARVEDILOL 3.125 MG TABLET: 3.125 | 30 days supply | Qty: 60 | Fill #4

## 2018-11-04 MED FILL — FUROSEMIDE 40 MG TAB: 40 | 90 days supply | Qty: 90 | Fill #0

## 2018-11-04 MED FILL — PANTOPRAZOLE SOD DR 40 MG T: 40 | 30 days supply | Qty: 30 | Fill #0

## 2018-11-04 MED FILL — XARELTO 20 MG TABLET: 20 | 30 days supply | Qty: 30 | Fill #4

## 2018-11-04 MED FILL — ATORVASTATIN 80 MG TABLET: 80 | 30 days supply | Qty: 30 | Fill #4

## 2018-11-04 MED FILL — LOSARTAN POTASSIUM 50 MG TA: 50 | 30 days supply | Qty: 30 | Fill #4

## 2018-11-05 MED FILL — GABAPENTIN 300 MG CAPSULE: 300 | 30 days supply | Qty: 30 | Fill #0

## 2018-11-09 ENCOUNTER — Inpatient Hospital Stay (HOSPITAL_COMMUNITY)
Admission: EM | Admit: 2018-11-09 | Discharge: 2018-11-11 | DRG: 190 | Disposition: A | Discharge status: Home / Self Care | Payer: Medicare HMO | Attending: Internal Medicine | Admitting: Internal Medicine

## 2018-11-09 ENCOUNTER — Encounter (HOSPITAL_COMMUNITY): Payer: Self-pay | Admitting: Emergency Medicine

## 2018-11-09 ENCOUNTER — Emergency Department (HOSPITAL_COMMUNITY): Payer: Medicare HMO

## 2018-11-09 ENCOUNTER — Other Ambulatory Visit: Payer: Self-pay

## 2018-11-09 ENCOUNTER — Inpatient Hospital Stay (HOSPITAL_COMMUNITY): Payer: Medicare HMO

## 2018-11-09 DIAGNOSIS — F17200 Nicotine dependence, unspecified, uncomplicated: Secondary | ICD-10-CM | POA: Diagnosis not present

## 2018-11-09 DIAGNOSIS — Z20828 Contact with and (suspected) exposure to other viral communicable diseases: Secondary | ICD-10-CM | POA: Diagnosis present

## 2018-11-09 DIAGNOSIS — J432 Centrilobular emphysema: Principal | ICD-10-CM | POA: Diagnosis present

## 2018-11-09 DIAGNOSIS — Z8719 Personal history of other diseases of the digestive system: Secondary | ICD-10-CM

## 2018-11-09 DIAGNOSIS — Z720 Tobacco use: Secondary | ICD-10-CM | POA: Diagnosis present

## 2018-11-09 DIAGNOSIS — K7689 Other specified diseases of liver: Secondary | ICD-10-CM | POA: Diagnosis present

## 2018-11-09 DIAGNOSIS — Z79899 Other long term (current) drug therapy: Secondary | ICD-10-CM

## 2018-11-09 DIAGNOSIS — R0602 Shortness of breath: Secondary | ICD-10-CM | POA: Diagnosis present

## 2018-11-09 DIAGNOSIS — I5042 Chronic combined systolic (congestive) and diastolic (congestive) heart failure: Secondary | ICD-10-CM | POA: Diagnosis not present

## 2018-11-09 DIAGNOSIS — D509 Iron deficiency anemia, unspecified: Secondary | ICD-10-CM | POA: Diagnosis present

## 2018-11-09 DIAGNOSIS — I4892 Unspecified atrial flutter: Secondary | ICD-10-CM | POA: Diagnosis present

## 2018-11-09 DIAGNOSIS — J9601 Acute respiratory failure with hypoxia: Secondary | ICD-10-CM

## 2018-11-09 DIAGNOSIS — I10 Essential (primary) hypertension: Secondary | ICD-10-CM | POA: Diagnosis present

## 2018-11-09 DIAGNOSIS — K1120 Sialoadenitis, unspecified: Secondary | ICD-10-CM | POA: Diagnosis present

## 2018-11-09 DIAGNOSIS — E785 Hyperlipidemia, unspecified: Secondary | ICD-10-CM | POA: Diagnosis present

## 2018-11-09 DIAGNOSIS — I2510 Atherosclerotic heart disease of native coronary artery without angina pectoris: Secondary | ICD-10-CM | POA: Diagnosis present

## 2018-11-09 DIAGNOSIS — I110 Hypertensive heart disease with heart failure: Secondary | ICD-10-CM | POA: Diagnosis present

## 2018-11-09 DIAGNOSIS — J9602 Acute respiratory failure with hypercapnia: Secondary | ICD-10-CM

## 2018-11-09 DIAGNOSIS — F17210 Nicotine dependence, cigarettes, uncomplicated: Secondary | ICD-10-CM | POA: Diagnosis present

## 2018-11-09 DIAGNOSIS — I5043 Acute on chronic combined systolic (congestive) and diastolic (congestive) heart failure: Secondary | ICD-10-CM | POA: Diagnosis not present

## 2018-11-09 DIAGNOSIS — Z888 Allergy status to other drugs, medicaments and biological substances status: Secondary | ICD-10-CM

## 2018-11-09 DIAGNOSIS — J441 Chronic obstructive pulmonary disease with (acute) exacerbation: Secondary | ICD-10-CM | POA: Diagnosis not present

## 2018-11-09 DIAGNOSIS — J320 Chronic maxillary sinusitis: Secondary | ICD-10-CM | POA: Diagnosis present

## 2018-11-09 DIAGNOSIS — E872 Acidosis: Secondary | ICD-10-CM | POA: Diagnosis present

## 2018-11-09 DIAGNOSIS — J9622 Acute and chronic respiratory failure with hypercapnia: Secondary | ICD-10-CM | POA: Diagnosis not present

## 2018-11-09 DIAGNOSIS — J9600 Acute respiratory failure, unspecified whether with hypoxia or hypercapnia: Secondary | ICD-10-CM | POA: Diagnosis not present

## 2018-11-09 DIAGNOSIS — Z8673 Personal history of transient ischemic attack (TIA), and cerebral infarction without residual deficits: Secondary | ICD-10-CM | POA: Diagnosis not present

## 2018-11-09 DIAGNOSIS — J9621 Acute and chronic respiratory failure with hypoxia: Secondary | ICD-10-CM | POA: Diagnosis present

## 2018-11-09 DIAGNOSIS — Z7901 Long term (current) use of anticoagulants: Secondary | ICD-10-CM

## 2018-11-09 DIAGNOSIS — I420 Dilated cardiomyopathy: Secondary | ICD-10-CM | POA: Diagnosis present

## 2018-11-09 DIAGNOSIS — Z9981 Dependence on supplemental oxygen: Secondary | ICD-10-CM | POA: Diagnosis not present

## 2018-11-09 DIAGNOSIS — I483 Typical atrial flutter: Secondary | ICD-10-CM | POA: Diagnosis not present

## 2018-11-09 LAB — BLOOD GAS, ARTERIAL
Acid-Base Excess: 15.4 mmol/L — ABNORMAL HIGH (ref 0.0–2.0)
Bicarbonate: 44.3 mmol/L — ABNORMAL HIGH (ref 20.0–28.0)
Delivery systems: POSITIVE
Drawn by: 232811
Drawn by: 441261
FIO2: 30
O2 Content: 3 L/min
O2 Content: 3 L/min
O2 Saturation: 98.8 %
O2 Saturation: 97.7 %
O2 Saturation: 90.1 %
PEEP: 5 cmH2O
Patient temperature: 98.3
Patient temperature: 98.6
Patient temperature: 98.6
Pressure control: 20 cmH2O
RATE: 18 resp/min
pCO2 arterial: 92.3 mmHg (ref 32.0–48.0)
pH, Arterial: 7.179 — CL (ref 7.350–7.450)
pH, Arterial: 7.157 — CL (ref 7.350–7.450)
pH, Arterial: 7.302 — ABNORMAL LOW (ref 7.350–7.450)
pO2, Arterial: 121 mmHg — ABNORMAL HIGH (ref 83.0–108.0)
pO2, Arterial: 110 mmHg — ABNORMAL HIGH (ref 83.0–108.0)
pO2, Arterial: 57.3 mmHg — ABNORMAL LOW (ref 83.0–108.0)

## 2018-11-09 LAB — COMPREHENSIVE METABOLIC PANEL
ALT: 14 U/L (ref 0–44)
AST: 17 U/L (ref 15–41)
Albumin: 3.5 g/dL (ref 3.5–5.0)
Alkaline Phosphatase: 58 U/L (ref 38–126)
Anion gap: 6 (ref 5–15)
BUN: 15 mg/dL (ref 8–23)
CO2: 42 mmol/L — ABNORMAL HIGH (ref 22–32)
Calcium: 8.7 mg/dL — ABNORMAL LOW (ref 8.9–10.3)
Chloride: 97 mmol/L — ABNORMAL LOW (ref 98–111)
Creatinine, Ser: 0.77 mg/dL (ref 0.61–1.24)
GFR calc Af Amer: >60 mL/min (ref >60)
GFR calc non Af Amer: >60 mL/min (ref >60)
Glucose, Bld: 171 mg/dL — ABNORMAL HIGH (ref 70–99)
Potassium: 3.6 mmol/L (ref 3.5–5.1)
Sodium: 145 mmol/L (ref 135–145)
Total Bilirubin: 0.2 mg/dL — ABNORMAL LOW (ref 0.3–1.2)
Total Protein: 6.8 g/dL (ref 6.5–8.1)

## 2018-11-09 LAB — CBC WITH DIFFERENTIAL/PLATELET
Abs Immature Granulocytes: 0.01 10*3/uL (ref 0.00–0.07)
Basophils Absolute: 0 10*3/uL (ref 0.0–0.1)
Basophils Relative: 1 %
Eosinophils Absolute: 0.1 10*3/uL (ref 0.0–0.5)
Eosinophils Relative: 2 %
HCT: 35.3 % — ABNORMAL LOW (ref 39.0–52.0)
Hemoglobin: 9.1 g/dL — ABNORMAL LOW (ref 13.0–17.0)
Immature Granulocytes: 0 %
Lymphocytes Relative: 26 %
Lymphs Abs: 1.7 10*3/uL (ref 0.7–4.0)
MCH: 26.4 pg (ref 26.0–34.0)
MCHC: 25.8 g/dL — ABNORMAL LOW (ref 30.0–36.0)
MCV: 102.3 fL — ABNORMAL HIGH (ref 80.0–100.0)
Monocytes Absolute: 0.6 10*3/uL (ref 0.1–1.0)
Monocytes Relative: 9 %
Neutro Abs: 4.2 10*3/uL (ref 1.7–7.7)
Neutrophils Relative %: 62 %
Platelets: 329 10*3/uL (ref 150–400)
RBC: 3.45 MIL/uL — ABNORMAL LOW (ref 4.22–5.81)
RDW: 15 % (ref 11.5–15.5)
WBC: 6.6 10*3/uL (ref 4.0–10.5)
nRBC: 0 % (ref 0.0–0.2)

## 2018-11-09 LAB — RAPID URINE DRUG SCREEN, HOSP PERFORMED
Amphetamines: NOT DETECTED
Barbiturates: NOT DETECTED
Benzodiazepines: NOT DETECTED
Cocaine: NOT DETECTED
Opiates: NOT DETECTED
Tetrahydrocannabinol: NOT DETECTED

## 2018-11-09 LAB — SARS CORONAVIRUS 2 BY RT PCR (HOSPITAL ORDER, PERFORMED IN ~~LOC~~ HOSPITAL LAB): SARS Coronavirus 2: NEGATIVE

## 2018-11-09 LAB — BRAIN NATRIURETIC PEPTIDE: B Natriuretic Peptide: 238.4 pg/mL — ABNORMAL HIGH (ref 0.0–100.0)

## 2018-11-09 LAB — TROPONIN I: Troponin I: <0.03 ng/mL (ref <0.03)

## 2018-11-09 LAB — MRSA PCR SCREENING: MRSA by PCR: NEGATIVE

## 2018-11-09 MED ORDER — CARVEDILOL 3.125 MG PO TABS
3.1250 mg | ORAL_TABLET | Freq: Two times a day (BID) | ORAL | Status: DC
  Administered 2018-11-09 – 2018-11-11 (×4): 3.125 mg via ORAL
  Filled 2018-11-09 (×4): qty 1

## 2018-11-09 MED ORDER — METHYLPREDNISOLONE SODIUM SUCC 125 MG IJ SOLR
125.0000 mg | Freq: Once | INTRAMUSCULAR | Status: AC
  Administered 2018-11-09: 125 mg via INTRAVENOUS
  Filled 2018-11-09: qty 2

## 2018-11-09 MED ORDER — CHLORHEXIDINE GLUCONATE CLOTH 2 % EX PADS
6.0000 | MEDICATED_PAD | Freq: Every day | CUTANEOUS | Status: DC
  Administered 2018-11-09 – 2018-11-11 (×3): 6 via TOPICAL

## 2018-11-09 MED ORDER — IPRATROPIUM-ALBUTEROL 0.5-2.5 (3) MG/3ML IN SOLN: 3.0000 mL | Freq: Three times a day (TID) | RESPIRATORY_TRACT | Status: DC

## 2018-11-09 MED ORDER — ACETAMINOPHEN 650 MG RE SUPP: 650.0000 mg | Freq: Four times a day (QID) | RECTAL | Status: DC | PRN

## 2018-11-09 MED ORDER — SODIUM CHLORIDE 0.9 % IV SOLN
INTRAVENOUS | Status: DC
  Administered 2018-11-09: 14:00:00 via INTRAVENOUS

## 2018-11-09 MED ORDER — LOSARTAN POTASSIUM 50 MG PO TABS
50.0000 mg | ORAL_TABLET | Freq: Every day | ORAL | Status: DC
  Administered 2018-11-09 – 2018-11-11 (×3): 50 mg via ORAL
  Filled 2018-11-09 (×3): qty 1

## 2018-11-09 MED ORDER — SODIUM CHLORIDE (PF) 0.9 % IJ SOLN
INTRAMUSCULAR | Status: AC
  Filled 2018-11-09: qty 50

## 2018-11-09 MED ORDER — ONDANSETRON HCL 4 MG/2ML IJ SOLN: 4.0000 mg | Freq: Four times a day (QID) | INTRAMUSCULAR | Status: DC | PRN

## 2018-11-09 MED ORDER — PANTOPRAZOLE SODIUM 40 MG IV SOLR
40.0000 mg | Freq: Two times a day (BID) | INTRAVENOUS | Status: DC
  Administered 2018-11-09 – 2018-11-10 (×3): 40 mg via INTRAVENOUS
  Filled 2018-11-09 (×3): qty 40

## 2018-11-09 MED ORDER — ALBUTEROL SULFATE HFA 108 (90 BASE) MCG/ACT IN AERS: 8.0000 | INHALATION_SPRAY | Freq: Once | RESPIRATORY_TRACT | Status: DC

## 2018-11-09 MED ORDER — GABAPENTIN 300 MG PO CAPS
300.0000 mg | ORAL_CAPSULE | Freq: Every day | ORAL | Status: DC
  Administered 2018-11-09 – 2018-11-10 (×2): 300 mg via ORAL
  Filled 2018-11-09 (×2): qty 1

## 2018-11-09 MED ORDER — ORAL CARE MOUTH RINSE
15.0000 mL | Freq: Two times a day (BID) | OROMUCOSAL | Status: DC
  Administered 2018-11-09 – 2018-11-11 (×4): 15 mL via OROMUCOSAL

## 2018-11-09 MED ORDER — BUDESONIDE 0.5 MG/2ML IN SUSP
0.5000 mg | Freq: Two times a day (BID) | RESPIRATORY_TRACT | Status: DC
  Administered 2018-11-09 – 2018-11-11 (×4): 0.5 mg via RESPIRATORY_TRACT
  Filled 2018-11-09 (×4): qty 2

## 2018-11-09 MED ORDER — ONDANSETRON HCL 4 MG PO TABS: 4.0000 mg | ORAL_TABLET | Freq: Four times a day (QID) | ORAL | Status: DC | PRN

## 2018-11-09 MED ORDER — IPRATROPIUM-ALBUTEROL 0.5-2.5 (3) MG/3ML IN SOLN
3.0000 mL | Freq: Four times a day (QID) | RESPIRATORY_TRACT | Status: DC
  Administered 2018-11-09 – 2018-11-10 (×3): 3 mL via RESPIRATORY_TRACT
  Filled 2018-11-09 (×4): qty 3

## 2018-11-09 MED ORDER — ACETAMINOPHEN 325 MG PO TABS
650.0000 mg | ORAL_TABLET | Freq: Four times a day (QID) | ORAL | Status: DC | PRN
  Administered 2018-11-10: 650 mg via ORAL
  Filled 2018-11-09: qty 2

## 2018-11-09 MED ORDER — ENOXAPARIN SODIUM 100 MG/ML ~~LOC~~ SOLN
90.0000 mg | Freq: Two times a day (BID) | SUBCUTANEOUS | Status: DC
  Administered 2018-11-09 – 2018-11-10 (×2): 90 mg via SUBCUTANEOUS
  Filled 2018-11-09 (×2): qty 0.9

## 2018-11-09 MED ORDER — METHYLPREDNISOLONE SODIUM SUCC 40 MG IJ SOLR
40.0000 mg | Freq: Two times a day (BID) | INTRAMUSCULAR | Status: DC
  Administered 2018-11-09 – 2018-11-11 (×5): 40 mg via INTRAVENOUS
  Filled 2018-11-09 (×6): qty 1

## 2018-11-09 MED ORDER — IOHEXOL 300 MG/ML  SOLN
75.0000 mL | Freq: Once | INTRAMUSCULAR | Status: AC | PRN
  Administered 2018-11-09: 75 mL via INTRAVENOUS

## 2018-11-09 MED ORDER — ALBUTEROL SULFATE (2.5 MG/3ML) 0.083% IN NEBU: 2.5000 mg | INHALATION_SOLUTION | RESPIRATORY_TRACT | Status: DC | PRN

## 2018-11-09 MED ORDER — HYDRALAZINE HCL 20 MG/ML IJ SOLN
5.0000 mg | Freq: Four times a day (QID) | INTRAMUSCULAR | Status: DC | PRN
  Filled 2018-11-09: qty 1

## 2018-11-09 NOTE — Progress Notes (Signed)
MD came to bedside and verbalized it was okay to advance the patient's diet. Pt currently on 3L [place] and oriented. Will continue to monitor

## 2018-11-09 NOTE — ED Notes (Signed)
ED TO INPATIENT HANDOFF REPORT  Name/Age/Gender [facility] 63 y.o. male  Code Status    Code Status Orders  (From admission, onward)         Start     Ordered   11/09/18 1125  Full code  Continuous     11/09/18 1124        Code Status History    Date Active Date Inactive Code Status Order ID Comments User Context   10/27/2018 2003 10/28/2018 2020 Full Code [id number]  Charlsie Quest, MD ED   09/25/2018 1645 09/28/2018 2103 Full Code [id number]  Leatha Gilding, MD Inpatient   09/04/2018 1847 09/07/2018 1845 Full Code [id number]  Almon Hercules, MD ED   05/13/2018 0400 05/13/2018 1916 Full Code [id number]  Arnetha Courser, MD ED   05/01/2018 1622 05/02/2018 2120 Full Code [id number]  Burna Cash, MD ED   06/04/2017 2316 06/06/2017 1708 Full Code [id number]  Eduard Clos, MD ED   07/02/2016 0201 07/06/2016 1731 Full Code [id number]  Alberteen Sam, MD Inpatient   05/04/2016 2138 05/09/2016 1659 Full Code [id number]  Meredeth Ide, MD Inpatient   01/10/2016 0818 01/13/2016 1505 Full Code [id number]  Edsel Petrin, DO Inpatient   01/09/2016 1732 01/10/2016 0818 DNR [id number]  Marcos Eke, PA-C ED   01/09/2016 1732 01/09/2016 1732 DNR [id number]  Marcos Eke, PA-C ED   10/03/2013 1135 10/08/2013 1933 DNR [id number]  Leroy Sea, MD Inpatient   10/03/2013 1135 10/03/2013 1135 DNR [id number]  Leroy Sea, MD Inpatient      Home/SNF/Other Home  Chief Complaint Problems Breathing/Weak  Level of Care/Admitting Diagnosis ED Disposition    ED Disposition Condition Comment   Admit  Hospital Area: [facility] [[id number]]  Level of Care: Stepdown [14]  Admit to SDU based on following criteria: Respiratory Distress:  Frequent assessment and/or intervention to maintain adequate ventilation/respiration, pulmonary toilet, and respiratory treatment.  Covid Evaluation: Confirmed COVID Negative  Diagnosis: Acute respiratory failure with hypoxia and  hypercapnia [facility]) [[id number]]  Admitting Physician: Narda Bonds [phone]  Attending Physician: Narda Bonds [phone]  Estimated length of stay: past midnight tomorrow  Certification:: I certify this patient will need inpatient services for at least 2 midnights  PT Class (Do Not Modify): Inpatient [101]  PT Acc Code (Do Not Modify): Private [1]       Medical History Past Medical History:  Diagnosis Date  . CAD (coronary artery disease)    a. LHC 5/12:  LAD 20, pLCx 20, pRCA 40, dRCA 40, EF 35%, diff HK  //  b. Myoview 4/16: Overall Impression: High risk stress nuclear study There is no evidence of ischemia. There is severe LV dysfunction. LV Ejection Fraction: 30%. LV Wall Motion: There is global LV hypokinesis.   Marland Kitchen CAP (community acquired pneumonia) 09/2013  . Chronic combined systolic and diastolic CHF (congestive heart failure) (HCC)    a. Echo 4/16:Mild LVH, EF 40-45%, diffuse HK //  b. Echo 8/17: EF 35-40%, diffuse HK, diastolic dysfunction, aortic sclerosis, trivial MR, moderate LAE, normal RVSF, moderate RAE, mild TR, PASP 42 mmHg // c. Echo 4/18: Mild concentric LVH, EF 30-35, normal wall motion, grade 1 diastolic dysfunction, PASP 49  . Cluster headache    "hx; haven't had one in awhile" (01/09/2016)  . COPD (chronic obstructive pulmonary disease) (HCC)    [name] 01/09/2016  . History of CVA (cerebrovascular accident)   . Hypertension   . NICM (nonischemic cardiomyopathy) (HCC)   .  Tobacco abuse     Allergies Allergies  Allergen Reactions  . Lisinopril Cough    IV Location/Drains/Wounds Patient Lines/Drains/Airways Status   Active Line/Drains/Airways    Name:   Placement date:   Placement time:   Site:   Days:   Peripheral IV 11/09/18 Left Antecubital   11/09/18    0547    Antecubital   less than 1   Peripheral IV 11/09/18 Left;Distal Forearm   11/09/18    1338    Forearm   less than 1          Labs/Imaging Results for orders placed or performed during the  hospital encounter of 11/09/18 (from the past 48 hour(s))  Troponin I - ONCE - STAT     Status: None   Collection Time: 11/09/18  5:29 AM  Result Value Ref Range   Troponin I <0.03 <0.03 ng/mL    Comment: Performed at [facility], [address] [place], [place] [address]  Comprehensive metabolic panel     Status: Abnormal   Collection Time: 11/09/18  5:29 AM  Result Value Ref Range   Sodium 145 135 - 145 mmol/L   Potassium 3.6 3.5 - 5.1 mmol/L   Chloride 97 (L) 98 - 111 mmol/L   CO2 42 (H) 22 - 32 mmol/L   Glucose, Bld 171 (H) 70 - 99 mg/dL   BUN 15 8 - 23 mg/dL   Creatinine, Ser 6.38 0.61 - 1.24 mg/dL   Calcium 8.7 (L) 8.9 - 10.3 mg/dL   Total Protein 6.8 6.5 - 8.1 g/dL   Albumin 3.5 3.5 - 5.0 g/dL   AST 17 15 - 41 U/L   ALT 14 0 - 44 U/L   Alkaline Phosphatase 58 38 - 126 U/L   Total Bilirubin 0.2 (L) 0.3 - 1.2 mg/dL   GFR calc non Af Amer >60 >60 mL/min   GFR calc Af Amer >60 >60 mL/min   Anion gap 6 5 - 15    Comment: Performed at [facility], [address], [place], [place] [address]  CBC with Differential     Status: Abnormal   Collection Time: 11/09/18  5:29 AM  Result Value Ref Range   WBC 6.6 4.0 - 10.5 K/uL   RBC 3.45 (L) 4.22 - 5.81 MIL/uL   Hemoglobin 9.1 (L) 13.0 - 17.0 g/dL   HCT 32.9 (L) 51.8 - 84.1 %   MCV 102.3 (H) 80.0 - 100.0 fL   MCH 26.4 26.0 - 34.0 pg   MCHC 25.8 (L) 30.0 - 36.0 g/dL   RDW 66.0 63.0 - 16.0 %   Platelets 329 150 - 400 K/uL   nRBC 0.0 0.0 - 0.2 %   Neutrophils Relative % 62 %   Neutro Abs 4.2 1.7 - 7.7 K/uL   Lymphocytes Relative 26 %   Lymphs Abs 1.7 0.7 - 4.0 K/uL   Monocytes Relative 9 %   Monocytes Absolute 0.6 0.1 - 1.0 K/uL   Eosinophils Relative 2 %   Eosinophils Absolute 0.1 0.0 - 0.5 K/uL   Basophils Relative 1 %   Basophils Absolute 0.0 0.0 - 0.1 K/uL   Immature Granulocytes 0 %   Abs Immature Granulocytes 0.01 0.00 - 0.07 K/uL    Comment: Performed at [facility], [address] [place], [place] [address]  SARS Coronavirus 2 (CEPHEID - Performed in [facility] hospital lab), Hosp Order     Status: None   Collection Time:  11/09/18  5:30 AM  Result Value Ref Range   SARS Coronavirus 2 NEGATIVE NEGATIVE    Comment: (NOTE) If result is NEGATIVE SARS-CoV-2 target nucleic acids are NOT DETECTED. The SARS-CoV-2 RNA is generally detectable in upper and lower  respiratory specimens during the acute phase of infection. The lowest  concentration of SARS-CoV-2 viral copies this assay can detect is 250  copies / mL. A negative result does not preclude SARS-CoV-2 infection  and should not be used as the sole basis for treatment or other  patient management decisions.  A negative result may occur with  improper specimen collection / handling, submission of specimen other  than nasopharyngeal swab, presence of viral mutation(s) within the  areas targeted by this assay, and inadequate number of viral copies  (<250 copies / mL). A negative result must be combined with clinical  observations, patient history, and epidemiological information. If result is POSITIVE SARS-CoV-2 target nucleic acids are DETECTED. The SARS-CoV-2 RNA is generally detectable in upper and lower  respiratory specimens dur ing the acute phase of infection.  Positive  results are indicative of active infection with SARS-CoV-2.  Clinical  correlation with patient history and other diagnostic information is  necessary to determine patient infection status.  Positive results do  not rule out bacterial infection or co-infection with other viruses. If result is PRESUMPTIVE POSTIVE SARS-CoV-2 nucleic acids MAY BE PRESENT.   A presumptive positive result was obtained on the submitted specimen  and confirmed on repeat testing.  While 2019 novel coronavirus  (SARS-CoV-2) nucleic acids may be present in the submitted sample  additional confirmatory testing may be necessary for  epidemiological  and / or clinical management purposes  to differentiate between  SARS-CoV-2 and other Sarbecovirus currently known to infect humans.  If clinically indicated additional testing with an alternate test  methodology [phone]) is advised. The SARS-CoV-2 RNA is generally  detectable in upper and lower respiratory sp ecimens during the acute  phase of infection. The expected result is Negative. Fact Sheet for Patients:  BoilerBrush.com.cy Fact Sheet for Healthcare Providers: https://pope.com/ This test is not yet approved or cleared by the [place] FDA and has been authorized for detection and/or diagnosis of SARS-CoV-2 by FDA under an Emergency Use Authorization (EUA).  This EUA will remain in effect (meaning this test can be used) for the duration of the COVID-19 declaration under Section 564(b)(1) of the Act, 21 U.S.C. section 360bbb-3(b)(1), unless the authorization is terminated or revoked sooner. Performed at [facility], [address] [place], [place] [address]   Brain natriuretic peptide     Status: Abnormal   Collection Time: 11/09/18  5:44 AM  Result Value Ref Range   B Natriuretic Peptide 238.4 (H) 0.0 - 100.0 pg/mL    Comment: Performed at [facility], [address], [place], [place] [address]  Blood gas, arterial (at [facility])     Status: Abnormal   Collection Time: 11/09/18  5:55 AM  Result Value Ref Range   O2 Content 3.0 L/min   Delivery systems NO CHARGE    pH, Arterial 7.179 (LL) 7.350 - 7.450    Comment: CRITICAL RESULT CALLED TO, READ BACK BY AND VERIFIED WITH: MIA MCDONALD, PA AT 0602 BY ANNALISSA AGUSTIN,RRT, RCP ON 11/09/18    pCO2 arterial VALUE ABOVE REPORTABLE RANGE 32.0 - 48.0 mmHg    Comment: CRITICAL RESULT CALLED TO, READ BACK BY AND VERIFIED WITH: MIA MCDONALD, PA AT 0602 BY ANNALISSA  AGUSTIN,RRT,RCP ON 11/09/18    pO2, Arterial 121 (H) 83.0 -  108.0 mmHg   O2 Saturation 98.8 %   Patient temperature 98.3    Collection site RIGHT RADIAL    Drawn by [id number]    Sample type ARTERIAL    Allens test (pass/fail) PASS PASS    Comment: Performed at [facility], [address] [place], [place] [address]  Blood gas, arterial (at [facility])     Status: Abnormal   Collection Time: 11/09/18  8:54 AM  Result Value Ref Range   O2 Content 3.0 L/min   Delivery systems NASAL CANNULA    pH, Arterial 7.157 (LL) 7.350 - 7.450    Comment: CRITICAL RESULT CALLED TO, READ BACK BY AND VERIFIED WITH: A.LAW, PA AT 5859 BY M.JESTER, RRT, RCP ON 11/09/2018    pCO2 arterial ABOVE REPORTABLE RANGE 32.0 - 48.0 mmHg    Comment: CRITICAL RESULT CALLED TO, READ BACK BY AND VERIFIED WITH: A.LAW, PA AT 2924 BY M.JESTER, RRT, RCP ON 11/09/2018    pO2, Arterial 110 (H) 83.0 - 108.0 mmHg   O2 Saturation 97.7 %   Patient temperature 98.6    Collection site LEFT RADIAL    Drawn by [id number]    Sample type ARTERIAL DRAW    Allens test (pass/fail) PASS PASS    Comment: Performed at [facility], [address], [place], [place] [address]  Blood gas, arterial     Status: Abnormal   Collection Time: 11/09/18 11:45 AM  Result Value Ref Range   FIO2 30.00    Delivery systems BILEVEL POSITIVE AIRWAY PRESSURE    Mode PRESSURE CONTROL    LHR 18.0 resp/min   Peep/cpap 5.0 cm H20   Pressure control 20.0 cm H20   pH, Arterial 7.302 (L) 7.350 - 7.450   pCO2 arterial 92.3 (HH) 32.0 - 48.0 mmHg    Comment: CRITICAL RESULT CALLED TO, READ BACK BY AND VERIFIED WITH: r.nettey, md at 3 by m.jester, rrt, rcp on 11/09/2018    pO2, Arterial 57.3 (L) 83.0 - 108.0 mmHg   Bicarbonate 44.3 (H) 20.0 - 28.0 mmol/L   Acid-Base Excess 15.4 (H) 0.0 - 2.0 mmol/L   O2 Saturation 90.1 %   Patient temperature 98.6    Collection site LEFT RADIAL    Sample type ARTERIAL DRAW    Allens test (pass/fail) PASS PASS    Comment: Performed at [facility], [address] [place], [place] [address]  Urine rapid drug screen (hosp performed)     Status: None   Collection Time: 11/09/18  1:37 PM  Result Value Ref Range   Opiates NONE DETECTED NONE DETECTED   Cocaine NONE DETECTED NONE DETECTED   Benzodiazepines NONE DETECTED NONE DETECTED   Amphetamines NONE DETECTED NONE DETECTED   Tetrahydrocannabinol NONE DETECTED NONE DETECTED   Barbiturates NONE DETECTED NONE DETECTED    Comment: (NOTE) DRUG SCREEN FOR MEDICAL PURPOSES ONLY.  IF CONFIRMATION IS NEEDED FOR ANY PURPOSE, NOTIFY LAB WITHIN 5 DAYS. LOWEST DETECTABLE LIMITS FOR URINE DRUG SCREEN Drug Class                     Cutoff (ng/mL) Amphetamine and metabolites    1000 Barbiturate and metabolites    200 Benzodiazepine                 200 Tricyclics and metabolites     300 Opiates and metabolites  300 Cocaine and metabolites        300 THC                            50 Performed at [facility], [address] [place], [place] [id number]    Dg Chest Port 1 View  Result Date: 11/09/2018 CLINICAL DATA:  Weakness, shortness of breath EXAM: PORTABLE CHEST 1 VIEW COMPARISON:  10/27/2018 FINDINGS: Prominent right infrahilar markings, reflecting normal pulmonary vasculature. No focal consolidation. No pleural effusion or pneumothorax. The heart is top-normal in size. IMPRESSION: No evidence of acute cardiopulmonary disease. Electronically Signed   By: Charline BillsSriyesh  Krishnan M.D.   On: 11/09/2018 05:33    Pending Labs Unresulted Labs (From admission, onward)    Start     Ordered   11/12/18 0500  CBC  Every 72 hours,   R     11/09/18 1233   11/10/18 0500  Basic metabolic panel  Tomorrow morning,   R     11/09/18 1124   11/10/18 0500  CBC  Tomorrow morning,   R     11/09/18 1124          Vitals/Pain Today's Vitals   11/09/18 1430 11/09/18 1436 11/09/18 1445 11/09/18 1500  BP: (!) 148/86  (!) 157/84 (!) 155/79  Pulse: 75  71 79  Resp:  19  (!) 21 18  Temp:      TempSrc:      SpO2: 99%  98% 98%  Weight:      Height:      PainSc:  0-No pain      Isolation Precautions No active isolations  Medications Medications  hydrALAZINE (APRESOLINE) injection 5 mg (has no administration in time range)  0.9 %  sodium chloride infusion ( Intravenous New Bag/Given 11/09/18 1339)  acetaminophen (TYLENOL) tablet 650 mg (has no administration in time range)    Or  acetaminophen (TYLENOL) suppository 650 mg (has no administration in time range)  ondansetron (ZOFRAN) tablet 4 mg (has no administration in time range)    Or  ondansetron (ZOFRAN) injection 4 mg (has no administration in time range)  methylPREDNISolone sodium succinate (SOLU-MEDROL) 40 mg/mL injection 40 mg (40 mg Intravenous Given 11/09/18 1340)  enoxaparin (LOVENOX) injection 90 mg (has no administration in time range)  albuterol (PROVENTIL) (2.5 MG/3ML) 0.083% nebulizer solution 2.5 mg (has no administration in time range)  budesonide (PULMICORT) nebulizer solution 0.5 mg (0.5 mg Nebulization Not Given 11/09/18 1317)  pantoprazole (PROTONIX) injection 40 mg (40 mg Intravenous Given 11/09/18 1342)  ipratropium-albuterol (DUONEB) 0.5-2.5 (3) MG/3ML nebulizer solution 3 mL (has no administration in time range)  methylPREDNISolone sodium succinate (SOLU-MEDROL) 125 mg/2 mL injection 125 mg (125 mg Intravenous Given 11/09/18 0555)    Mobility walks

## 2018-11-09 NOTE — ED Provider Notes (Signed)
Patient seen/examined in the Emergency Department in conjunction with Advanced Practice Provider [place] Patient reports shortness of breath and weakness Exam : pt somnolent but arousable, decreased BS noted bilaterally Plan: pt with COPD exacerbation and hypercapnea Will need admission    Zadie Rhine, MD 11/09/18 [phone]

## 2018-11-09 NOTE — Progress Notes (Signed)
Patient removed from NIV and placed on 3 L [place]. Patient alert at this time and going to ICU. RT will continue to monitor patient.

## 2018-11-09 NOTE — Consult Note (Addendum)
NAME:  Jared [name], MRN:  [id number], DOB:  1955-07-29, LOS: 0 ADMISSION DATE:  11/09/2018, CONSULTATION DATE:  11/09/2018 REFERRING MD:  Dr. Caleb Popp, [facility], CHIEF COMPLAINT:  Short of breath   Brief History   63 yo male smoker presented to ER with dyspnea, weakness, confusion, hypoxia from COPD exacerbation with acute on chronic hypoxia and hypercapnia.  Followed by Dr. [name] in pulmonary office.  Was in hospital 5/18 to 5/19 with bleeding duodenal AVM.  History of present illness   Denies chest pain, fever, sick exposures.  Has wheezing.  Wants to eat.  Started on Bipap in ER after COVID test negative.  Past Medical History  COPD with emphysema, Nonischemic CM, HTN, CVA, Cluster headaches, Diastolic CHF, CAD, PNA, duodenal AVM, A flutter on xarelto  Significant Hospital Events   5/31 Admit  Consults:    Procedures:    Significant Diagnostic Tests:  Spirometry 10/22/16 >> FEV1 1.2 (35%), FEV1% 48 Echo 05/02/18 >> EF 45 to 50%, grade 1 DD CT angio chest 05/13/18 >> coronary calcification, moderate centrilobular emphysema, micronodularity of liver  Micro Data:  COVID 5/31 >> negative  Antimicrobials:    Interim history/subjective:    Objective   Blood pressure (!) 161/89, pulse 80, temperature 98.9 F (37.2 C), temperature source Oral, resp. rate (!) 21, height  (1.905 m), weight 89.4 kg, SpO2 96 %.    Vent Mode: PCV;BIPAP FiO2 (%):  [3 %-30 %] 30 % Set Rate:  [18 bmp] 18 bmp PEEP:  [5 cmH20] 5 cmH20  No intake or output data in the 24 hours ending 11/09/18 1234 Filed Weights   11/09/18 0439  Weight: 89.4 kg    Examination:  General - alert Eyes - pupils reactive ENT - Bipap mask on Cardiac - regular rate/rhythm, no murmur Chest - b/l wheezing Abdomen - soft, non tender, + bowel sounds Extremities - no cyanosis, clubbing, or edema Skin - dry skin Neuro - normal strength, moves extremities, follows commands Lymphatics - no lymphadenopathy Psych - normal  mood and behavior  CXR (reviewed by me) - no acute infiltrates  Resolved Hospital Problem list     Assessment & Plan:   Acute on chronic hypoxic, hypercapnic respiratory failure from COPD exacerbation. Severe COPD with emphysema. Tobacco abuse. Plan - oxygen to keep SpO2 90 to 95% - Bipap prn - f/u CXR as needed - scheduled BDs - continue solumedrol - check UDS  Hx of CAD, Non ischemic CM, CVA, A flutter, HTN, HLD. Plan - per primary team  Hx of GI bleeding from duodenal AVM. Plan - f/u Hb - protonix bid   Best practice:  Diet: Heart healthy diet DVT prophylaxis: lovenox GI prophylaxis: Protonix Mobility: bed rest Code Status: full code Disposition: ICU  Labs   CBC: Recent Labs  Lab 11/03/18 1032 11/09/18 0529  WBC 6.3 6.6  NEUTROABS  --  4.2  HGB 8.6* 9.1*  HCT 31.7* 35.3*  MCV 97.2 102.3*  PLT 308 329    Basic Metabolic Panel: Recent Labs  Lab 11/03/18 1032 11/09/18 0529  NA 143 145  K 3.7 3.6  CL 96* 97*  CO2 38* 42*  GLUCOSE 141* 171*  BUN 11 15  CREATININE 0.84 0.77  CALCIUM 8.5* 8.7*   GFR: Estimated Creatinine Clearance: 110 mL/min (by C-G formula based on SCr of 0.77 mg/dL). Recent Labs  Lab 11/03/18 1032 11/09/18 0529  WBC 6.3 6.6    Liver Function Tests: Recent Labs  Lab 11/09/18 0529  AST  17  ALT 14  ALKPHOS 58  BILITOT 0.2*  PROT 6.8  ALBUMIN 3.5   No results for input(s): LIPASE, AMYLASE in the last 168 hours. No results for input(s): AMMONIA in the last 168 hours.  ABG    Component Value Date/Time   PHART 7.302 (L) 11/09/2018 1145   PCO2ART 92.3 (HH) 11/09/2018 1145   PO2ART 57.3 (L) 11/09/2018 1145   HCO3 44.3 (H) 11/09/2018 1145   TCO2 45 (H) 09/04/2018 1646   O2SAT 90.1 11/09/2018 1145     Coagulation Profile: No results for input(s): INR, PROTIME in the last 168 hours.  Cardiac Enzymes: Recent Labs  Lab 11/09/18 0529  TROPONINI <0.03    HbA1C: Hemoglobin A1C  Date/Time Value Ref Range  Status  05/14/2016 04:55 PM 6.3  Final   Hgb A1c MFr Bld  Date/Time Value Ref Range Status  10/03/2013 12:00 PM 6.2 (H) <5.7 % Final    Comment:    (NOTE)                                                                       According to the ADA Clinical Practice Recommendations for 2011, when HbA1c is used as a screening test:  >=6.5%   Diagnostic of Diabetes Mellitus           (if abnormal result is confirmed) 5.7-6.4%   Increased risk of developing Diabetes Mellitus References:Diagnosis and Classification of Diabetes Mellitus,Diabetes Care,2011,34(Suppl 1):S62-S69 and Standards of Medical Care in         Diabetes - 2011,Diabetes Care,2011,34 (Suppl 1):S11-S61.    CBG: No results for input(s): GLUCAP in the last 168 hours.  Review of Systems:   Negative except above  Past Medical History  He,  has a past medical history of CAD (coronary artery disease), CAP (community acquired pneumonia) (09/2013), Chronic combined systolic and diastolic CHF (congestive heart failure) (HCC), Cluster headache, COPD (chronic obstructive pulmonary disease) (HCC), History of CVA (cerebrovascular accident), Hypertension, NICM (nonischemic cardiomyopathy) (HCC), and Tobacco abuse.   Surgical History    Past Surgical History:  Procedure Laterality Date  . CARDIAC CATHETERIZATION  10/2010   LM normal, LAD with 20% irregularities, LCX with 20%, RCA with 40% prox and 40% distal - EF of 35%  . COLONOSCOPY W/ BIOPSIES AND POLYPECTOMY    . COLONOSCOPY WITH PROPOFOL N/A 09/06/2018   Procedure: COLONOSCOPY WITH PROPOFOL;  Surgeon: Tressia Danas, MD;  Location: [facility] ENDOSCOPY;  Service: Gastroenterology;  Laterality: N/A;  . ENTEROSCOPY N/A 09/28/2018   Procedure: ENTEROSCOPY;  Surgeon: Jeani Hawking, MD;  Location: [facility] ENDOSCOPY;  Service: Endoscopy;  Laterality: N/A;  . ENTEROSCOPY N/A 10/28/2018   Procedure: ENTEROSCOPY;  Surgeon: Tressia Danas, MD;  Location: [facility] ENDOSCOPY;  Service: Gastroenterology;   Laterality: N/A;  . ESOPHAGOGASTRODUODENOSCOPY (EGD) WITH PROPOFOL N/A 09/05/2018   Procedure: ESOPHAGOGASTRODUODENOSCOPY (EGD) WITH PROPOFOL;  Surgeon: Benancio Deeds, MD;  Location: [facility] ENDOSCOPY;  Service: Gastroenterology;  Laterality: N/A;  . EXCISION MASS HEAD    . GIVENS CAPSULE STUDY N/A 09/06/2018   Procedure: GIVENS CAPSULE STUDY;  Surgeon: Tressia Danas, MD;  Location: [facility] ENDOSCOPY;  Service: Gastroenterology;  Laterality: N/A;  . GIVENS CAPSULE STUDY N/A 09/26/2018   Procedure: GIVENS CAPSULE STUDY;  Surgeon: Erick Blinks  M, MD;  Location: [facility] ENDOSCOPY;  Service: Gastroenterology;  Laterality: N/A;  . HOT HEMOSTASIS N/A 10/28/2018   Procedure: HOT HEMOSTASIS (ARGON PLASMA COAGULATION/BICAP);  Surgeon: Tressia Danas, MD;  Location: [facility] ENDOSCOPY;  Service: Gastroenterology;  Laterality: N/A;  . INCISION AND DRAINAGE PERIRECTAL ABSCESS N/A 06/05/2017   Procedure: IRRIGATION AND DEBRIDEMENT PERIRECTAL ABSCESS;  Surgeon: Andria Meuse, MD;  Location: [facility] OR;  Service: General;  Laterality: N/A;  . VIDEO BRONCHOSCOPY Bilateral 05/08/2016   Procedure: VIDEO BRONCHOSCOPY WITH FLUORO;  Surgeon: Oretha Milch, MD;  Location: [facility] ENDOSCOPY;  Service: Cardiopulmonary;  Laterality: Bilateral;     Social History   reports that he has been smoking cigarettes. He has a 21.00 pack-year smoking history. He has never used smokeless tobacco. He reports current alcohol use. He reports that he does not use drugs.   Family History   His family history includes Cancer in his father; Colon cancer in his mother; Diabetes in his brother, mother, and sister; Heart disease in his mother; Liver cancer in his mother.   Allergies Allergies  Allergen Reactions  . Lisinopril Cough     Home Medications  Prior to Admission medications   Medication Sig Start Date End Date Taking? Authorizing Provider  albuterol (PROVENTIL HFA;VENTOLIN HFA) 108 (90 Base) MCG/ACT inhaler Inhale 2 puffs into the  lungs every 6 (six) hours as needed for wheezing or shortness of breath. 05/15/18  Yes [name], MD  atorvastatin (LIPITOR) 80 MG tablet Take 1 tablet (80 mg total) by mouth daily. 09/06/18  Yes Tressia Danas, MD  carvedilol (COREG) 3.125 MG tablet Take 1 tablet (3.125 mg total) by mouth 2 (two) times daily. 05/15/18 05/15/19 Yes [name], MD  cetirizine (ZYRTEC) 10 MG tablet Take 1 tablet (10 mg total) by mouth daily. 06/18/18  Yes [name], MD  ferrous sulfate 325 (65 FE) MG tablet Take 1 tablet (325 mg total) by mouth daily with breakfast. 09/09/18  Yes Danis, Starr Lake III, MD  fluticasone furoate-vilanterol (BREO ELLIPTA) 100-25 MCG/INH AEPB Inhale 1 puff into the lungs daily. 05/15/18  Yes Newlin, Odette Horns, MD  furosemide (LASIX) 40 MG tablet TAKE 1 TABLET (40 MG TOTAL) BY MOUTH DAILY. 11/04/18 11/04/19 Yes Newlin, Odette Horns, MD  gabapentin (NEURONTIN) 300 MG capsule TAKE 1 CAPSULE (300 MG TOTAL) BY MOUTH AT BEDTIME. 11/05/18  Yes Newlin, Enobong, MD  ipratropium-albuterol (DUONEB) 0.5-2.5 (3) MG/3ML SOLN Take 3 mLs by nebulization every 6 (six) hours as needed. Patient taking differently: Take 3 mLs by nebulization every 6 (six) hours as needed (SOB).  09/25/18  Yes [name], MD  losartan (COZAAR) 50 MG tablet Take 1 tablet (50 mg total) by mouth daily. 05/15/18  Yes [name], MD  pantoprazole (PROTONIX) 40 MG tablet Take 1 tablet (40 mg total) by mouth daily at 6 (six) AM. 09/07/18  Yes Glade Lloyd, MD  rivaroxaban (XARELTO) 20 MG TABS tablet Take 20 mg by mouth daily with supper.   Yes [provider]      Coralyn Helling, MD [facility] Pulmonary/Critical Care 11/09/2018, 12:54 PM

## 2018-11-09 NOTE — ED Notes (Signed)
Report given to [facility] for 2W, 1226.

## 2018-11-09 NOTE — H&P (Addendum)
History and Physical    Jared [name] VVZ:[id number] DOB: 1956/02/14 DOA: 11/09/2018  PCP: [name], MD Patient coming from: Home  Chief Complaint: Shortness of breath  HPI: Jared [name] is a 63 y.o. male with medical history significant of CAD, chronic combined systolic and diastolic heart failure, history of CVA, hypertension, COPD, tobacco abuse. History difficult to obtain secondary to patient being on BiPAP. Patient presented secondary to worsening shortness of breath. No associated fever, cough, chest pain, sick contacts.  ED Course: Vitals: Afebrile, pulse of 70s, respirations of 18-19, SpO2 in low 90s on 3L via nasal canula Labs: ABG 7.179/above reportable range/121 -> 7.157/above reportable range/110, CO2 42, BNP of 238, hemoglobin of 9.1, MCV of 102.3 Imaging: Chest x-ray without acute disease Medications/Course: Nasal cannula, Solu-Medrol  Review of Systems: Review of Systems  Unable to perform ROS: Medical condition  Constitutional: Negative for chills and fever.  Respiratory: Positive for shortness of breath. Negative for cough.   Cardiovascular: Negative for chest pain.  Gastrointestinal: Negative for abdominal pain, constipation, diarrhea, nausea and vomiting.  All other systems reviewed and are negative.   Past Medical History:  Diagnosis Date   CAD (coronary artery disease)    a. LHC 5/12:  LAD 20, pLCx 20, pRCA 40, dRCA 40, EF 35%, diff HK  //  b. Myoview 4/16: Overall Impression: High risk stress nuclear study There is no evidence of ischemia. There is severe LV dysfunction. LV Ejection Fraction: 30%. LV Wall Motion: There is global LV hypokinesis.    CAP (community acquired pneumonia) 09/2013   Chronic combined systolic and diastolic CHF (congestive heart failure) (HCC)    a. Echo 4/16:Mild LVH, EF 40-45%, diffuse HK //  b. Echo 8/17: EF 35-40%, diffuse HK, diastolic dysfunction, aortic sclerosis, trivial MR, moderate LAE, normal RVSF, moderate RAE,  mild TR, PASP 42 mmHg // c. Echo 4/18: Mild concentric LVH, EF 30-35, normal wall motion, grade 1 diastolic dysfunction, PASP 49   Cluster headache    "hx; haven't had one in awhile" (01/09/2016)   COPD (chronic obstructive pulmonary disease) (HCC)    [name] 01/09/2016   History of CVA (cerebrovascular accident)    Hypertension    NICM (nonischemic cardiomyopathy) (HCC)    Tobacco abuse     Past Surgical History:  Procedure Laterality Date   CARDIAC CATHETERIZATION  10/2010   LM normal, LAD with 20% irregularities, LCX with 20%, RCA with 40% prox and 40% distal - EF of 35%   COLONOSCOPY W/ BIOPSIES AND POLYPECTOMY     COLONOSCOPY WITH PROPOFOL N/A 09/06/2018   Procedure: COLONOSCOPY WITH PROPOFOL;  Surgeon: Tressia Danas, MD;  Location: [facility] ENDOSCOPY;  Service: Gastroenterology;  Laterality: N/A;   ENTEROSCOPY N/A 09/28/2018   Procedure: ENTEROSCOPY;  Surgeon: Jeani Hawking, MD;  Location: [facility] ENDOSCOPY;  Service: Endoscopy;  Laterality: N/A;   ENTEROSCOPY N/A 10/28/2018   Procedure: ENTEROSCOPY;  Surgeon: Tressia Danas, MD;  Location: [facility] ENDOSCOPY;  Service: Gastroenterology;  Laterality: N/A;   ESOPHAGOGASTRODUODENOSCOPY (EGD) WITH PROPOFOL N/A 09/05/2018   Procedure: ESOPHAGOGASTRODUODENOSCOPY (EGD) WITH PROPOFOL;  Surgeon: Benancio Deeds, MD;  Location: [facility] ENDOSCOPY;  Service: Gastroenterology;  Laterality: N/A;   EXCISION MASS HEAD     GIVENS CAPSULE STUDY N/A 09/06/2018   Procedure: GIVENS CAPSULE STUDY;  Surgeon: Tressia Danas, MD;  Location: [facility] ENDOSCOPY;  Service: Gastroenterology;  Laterality: N/A;   GIVENS CAPSULE STUDY N/A 09/26/2018   Procedure: GIVENS CAPSULE STUDY;  Surgeon: Beverley Fiedler, MD;  Location: [facility] ENDOSCOPY;  Service:  Gastroenterology;  Laterality: N/A;   HOT HEMOSTASIS N/A 10/28/2018   Procedure: HOT HEMOSTASIS (ARGON PLASMA COAGULATION/BICAP);  Surgeon: Tressia Danas, MD;  Location: [facility] ENDOSCOPY;  Service: Gastroenterology;   Laterality: N/A;   INCISION AND DRAINAGE PERIRECTAL ABSCESS N/A 06/05/2017   Procedure: IRRIGATION AND DEBRIDEMENT PERIRECTAL ABSCESS;  Surgeon: Andria Meuse, MD;  Location: [facility] OR;  Service: General;  Laterality: N/A;   VIDEO BRONCHOSCOPY Bilateral 05/08/2016   Procedure: VIDEO BRONCHOSCOPY WITH FLUORO;  Surgeon: Oretha Milch, MD;  Location: [facility] ENDOSCOPY;  Service: Cardiopulmonary;  Laterality: Bilateral;     reports that he has been smoking cigarettes. He has a 21.00 pack-year smoking history. He has never used smokeless tobacco. He reports current alcohol use. He reports that he does not use drugs.  Allergies  Allergen Reactions   Lisinopril Cough    Family History  Problem Relation Age of Onset   Heart disease Mother    Diabetes Mother    Colon cancer Mother    Liver cancer Mother    Cancer Father        type unknown   Diabetes Sister        x 2   Diabetes Brother    Prior to Admission medications   Medication Sig Start Date End Date Taking? Authorizing Provider  albuterol (PROVENTIL HFA;VENTOLIN HFA) 108 (90 Base) MCG/ACT inhaler Inhale 2 puffs into the lungs every 6 (six) hours as needed for wheezing or shortness of breath. 05/15/18   [name], MD  atorvastatin (LIPITOR) 80 MG tablet Take 1 tablet (80 mg total) by mouth daily. 09/06/18   Tressia Danas, MD  carvedilol (COREG) 3.125 MG tablet Take 1 tablet (3.125 mg total) by mouth 2 (two) times daily. 05/15/18 05/15/19  [name], MD  cetirizine (ZYRTEC) 10 MG tablet Take 1 tablet (10 mg total) by mouth daily. 06/18/18   [name], MD  ferrous sulfate 325 (65 FE) MG tablet Take 1 tablet (325 mg total) by mouth daily with breakfast. 09/09/18   Danis, Andreas Blower, MD  fluticasone furoate-vilanterol (BREO ELLIPTA) 100-25 MCG/INH AEPB Inhale 1 puff into the lungs daily. 05/15/18   [name], MD  furosemide (LASIX) 40 MG tablet TAKE 1 TABLET (40 MG TOTAL) BY MOUTH DAILY. 11/04/18 11/04/19   [name], MD  gabapentin (NEURONTIN) 300 MG capsule TAKE 1 CAPSULE (300 MG TOTAL) BY MOUTH AT BEDTIME. 11/05/18   Newlin, Enobong, MD  ipratropium-albuterol (DUONEB) 0.5-2.5 (3) MG/3ML SOLN Take 3 mLs by nebulization every 6 (six) hours as needed. Patient taking differently: Take 3 mLs by nebulization every 6 (six) hours as needed (SOB).  09/25/18   [name], MD  losartan (COZAAR) 50 MG tablet Take 1 tablet (50 mg total) by mouth daily. 05/15/18   [name], MD  pantoprazole (PROTONIX) 40 MG tablet Take 1 tablet (40 mg total) by mouth daily at 6 (six) AM. 09/07/18   Glade Lloyd, MD  rivaroxaban (XARELTO) 20 MG TABS tablet Take 20 mg by mouth daily with supper.    [provider]    Physical Exam:  Physical Exam Vitals signs and nursing note reviewed.  Constitutional:      General: He is sleeping. He is not in acute distress.    Appearance: He is well-developed and normal weight. He is not toxic-appearing or diaphoretic.  Cardiovascular:     Rate and Rhythm: Normal rate and regular rhythm.     Heart sounds: No murmur.  Pulmonary:     Effort: No  tachypnea or accessory muscle usage.     Breath sounds: Decreased air movement present. No stridor. Decreased breath sounds and wheezing (mild scattered) present. No rhonchi or rales.  Abdominal:     General: Bowel sounds are normal.     Palpations: Abdomen is soft.     Tenderness: There is no abdominal tenderness. There is no guarding.  Musculoskeletal: Normal range of motion.     Right lower leg: No edema.     Left lower leg: No edema.  Skin:    General: Skin is warm and dry.  Neurological:     Mental Status: He is easily aroused.     Comments: Oriented to person and place      Labs on Admission: I have personally reviewed following labs and imaging studies  CBC: Recent Labs  Lab 11/03/18 1032 11/09/18 0529  WBC 6.3 6.6  NEUTROABS  --  4.2  HGB 8.6* 9.1*  HCT 31.7* 35.3*  MCV 97.2 102.3*  PLT 308  329    Basic Metabolic Panel: Recent Labs  Lab 11/03/18 1032 11/09/18 0529  NA 143 145  K 3.7 3.6  CL 96* 97*  CO2 38* 42*  GLUCOSE 141* 171*  BUN 11 15  CREATININE 0.84 0.77  CALCIUM 8.5* 8.7*    GFR: Estimated Creatinine Clearance: 110 mL/min (by C-G formula based on SCr of 0.77 mg/dL).  Liver Function Tests: Recent Labs  Lab 11/09/18 0529  AST 17  ALT 14  ALKPHOS 58  BILITOT 0.2*  PROT 6.8  ALBUMIN 3.5   No results for input(s): LIPASE, AMYLASE in the last 168 hours. No results for input(s): AMMONIA in the last 168 hours.  Coagulation Profile: No results for input(s): INR, PROTIME in the last 168 hours.  Cardiac Enzymes: Recent Labs  Lab 11/09/18 0529  TROPONINI <0.03    BNP (last 3 results) No results for input(s): PROBNP in the last 8760 hours.  HbA1C: No results for input(s): HGBA1C in the last 72 hours.  CBG: No results for input(s): GLUCAP in the last 168 hours.  Lipid Profile: No results for input(s): CHOL, HDL, LDLCALC, TRIG, CHOLHDL, LDLDIRECT in the last 72 hours.  Thyroid Function Tests: No results for input(s): TSH, T4TOTAL, FREET4, T3FREE, THYROIDAB in the last 72 hours.  Anemia Panel: No results for input(s): VITAMINB12, FOLATE, FERRITIN, TIBC, IRON, RETICCTPCT in the last 72 hours.  Urine analysis:    Component Value Date/Time   COLORURINE YELLOW 07/25/2016 1845   APPEARANCEUR HAZY (A) 07/25/2016 1845   LABSPEC 1.017 07/25/2016 1845   PHURINE 5.0 07/25/2016 1845   GLUCOSEU 50 (A) 07/25/2016 1845   HGBUR SMALL (A) 07/25/2016 1845   BILIRUBINUR NEGATIVE 07/25/2016 1845   KETONESUR NEGATIVE 07/25/2016 1845   PROTEINUR 100 (A) 07/25/2016 1845   NITRITE NEGATIVE 07/25/2016 1845   LEUKOCYTESUR NEGATIVE 07/25/2016 1845     Radiological Exams on Admission: Dg Chest Port 1 View  Result Date: 11/09/2018 CLINICAL DATA:  Weakness, shortness of breath EXAM: PORTABLE CHEST 1 VIEW COMPARISON:  10/27/2018 FINDINGS: Prominent right  infrahilar markings, reflecting normal pulmonary vasculature. No focal consolidation. No pleural effusion or pneumothorax. The heart is top-normal in size. IMPRESSION: No evidence of acute cardiopulmonary disease. Electronically Signed   By: Charline Bills M.D.   On: 11/09/2018 05:33    EKG: Independently reviewed. Sinus rhythm. PVC  Assessment/Plan Active Problems:   Acute respiratory failure with hypoxia and hypercapnia (HCC)   Acute on chronic respiratory failure with hypercapnia and hypoxia Acute  respiratory acidosis Patient appears to be a likely chronic retainer from review of BMPs. Concern for COPD exacerbation. He is less lethargic since starting BiPAP but he has significant acidemia and hypercarbia. -Duoneb scheduled, Solu-medrol 40 mg BID -Bipap; repeat ABG one hour -PCCM recommendations  COPD exacerbation Patient is on Albuterol, Duoneb, Breo as an outpatient. He continues to smoke. Smoking cessation not discussed secondary to patient being on BiPAP -Hold outpatient regimen acutely -Management as mentioned above  Chronic combined systolic/diastolic heart failure Last Transthoracic Echocardiogram from 04/2018 significant for an EF of 45-50% and grade 1 diastolic dysfunction. Appears euvolemic. Currently NPO -Daily weights/strict in and out -Hold Lasix while NPO  Atrial flutter Rate controlled. On Coreg. -Holding Coreg for now secondary to NPO while on BiPAP -Lovenox instead of Xarelto while NPO  Hypertension On Coreg and losartan as an outpatient -Hydralazine IV prn   DVT prophylaxis: Lovenox (full dose) Code Status: Full code Family Communication: None Disposition Plan: Stepdown Consults called: PCCM Admission status: Inpatient   Jacquelin Hawking, MD [facility] 11/09/2018, 10:47 AM  If 7PM-7AM, please contact night-coverage [email or web address] Password [email or web address]

## 2018-11-09 NOTE — ED Provider Notes (Addendum)
[facility]-EMERGENCY DEPT Provider Note   CSN: [id number] Arrival date & time: 11/09/18  0431    History   Chief Complaint Chief Complaint  Patient presents with   Weakness   Shortness of Breath    HPI Jared [name] is a 63 y.o. male with a history of COPD, chronic respiratory failure with hypoxia on 3L home O2 at rest and 4L with exertion. CAD, chronic combined systolic and diastolic CHF (EF 42-59% from ECHO 11/19 improved from 30-35% in 4/18), CVA who presents to the emergency department with a chief complaint of shortness of breath.  The patient reports a constant, worsening shortness of breath and wheezing for the last 2 weeks.  He has not had to increase his home oxygen.  He reports that he was having some chest pain in the right side of his chest approximately 1 week ago, but reports that this is since resolved.    He denies fever, chills, cough, palpitations, leg swelling, nausea, vomiting, diarrhea, abdominal pain, or headache. He also reports that he has been feeling generally weak.  He denies black or bloody stools.  He reports he did have a nosebleed several days ago after undergoing COVID-19 testing.  No other recent bleeding.  He reports that he continues to use tobacco products.  He has been treating his symptoms at home with albuterol, last use was yesterday with minimal improvement in his symptoms.   He had recent hospitalizations for GI bleed in 4/16-19/20 and 3/26-29/20     The history is provided by the patient. No language interpreter was used.    Past Medical History:  Diagnosis Date   CAD (coronary artery disease)    a. LHC 5/12:  LAD 20, pLCx 20, pRCA 40, dRCA 40, EF 35%, diff HK  //  b. Myoview 4/16: Overall Impression: High risk stress nuclear study There is no evidence of ischemia. There is severe LV dysfunction. LV Ejection Fraction: 30%. LV Wall Motion: There is global LV hypokinesis.    CAP (community acquired pneumonia)  09/2013   Chronic combined systolic and diastolic CHF (congestive heart failure) (HCC)    a. Echo 4/16:Mild LVH, EF 40-45%, diffuse HK //  b. Echo 8/17: EF 35-40%, diffuse HK, diastolic dysfunction, aortic sclerosis, trivial MR, moderate LAE, normal RVSF, moderate RAE, mild TR, PASP 42 mmHg // c. Echo 4/18: Mild concentric LVH, EF 30-35, normal wall motion, grade 1 diastolic dysfunction, PASP 49   Cluster headache    "hx; haven't had one in awhile" (01/09/2016)   COPD (chronic obstructive pulmonary disease) (HCC)    [name] 01/09/2016   History of CVA (cerebrovascular accident)    Hypertension    NICM (nonischemic cardiomyopathy) (HCC)    Tobacco abuse     Patient Active Problem List   Diagnosis Date Noted   Acute respiratory failure with hypoxia and hypercapnia (HCC) 11/09/2018   GI bleed 10/27/2018   Chronic anticoagulation 09/27/2018   AV malformation of gastrointestinal tract    Occult GI bleeding    Heme positive stool    Iron deficiency anemia    Symptomatic anemia 09/04/2018   Tobacco use disorder 09/04/2018   Noncompliance 06/14/2017   Elevated LFTs 08/13/2016   Chronic respiratory failure with hypoxia (HCC) 07/24/2016   Atrial flutter (HCC)    Hepatic congestion 07/02/2016   Chronic combined systolic and diastolic CHF (congestive heart failure) (HCC) 07/01/2016   COPD with acute exacerbation (HCC) 06/03/2016   Prediabetes 05/14/2016   Hemoptysis 05/06/2016  COPD GOLD III/still smoking  02/29/2016   Cigarette smoker 01/09/2016   CAD (coronary artery disease) 10/07/2013   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   Centrilobular emphysema (HCC) 10/04/2013   Pulmonary infiltrate in right lung on CXR 10/03/2013   Acute on chronic respiratory failure with hypoxia (HCC) 10/03/2013   Essential hypertension     Past Surgical History:  Procedure Laterality Date   CARDIAC CATHETERIZATION  10/2010   LM normal, LAD with 20%  irregularities, LCX with 20%, RCA with 40% prox and 40% distal - EF of 35%   COLONOSCOPY W/ BIOPSIES AND POLYPECTOMY     COLONOSCOPY WITH PROPOFOL N/A 09/06/2018   Procedure: COLONOSCOPY WITH PROPOFOL;  Surgeon: Tressia Danas, MD;  Location: [facility] ENDOSCOPY;  Service: Gastroenterology;  Laterality: N/A;   ENTEROSCOPY N/A 09/28/2018   Procedure: ENTEROSCOPY;  Surgeon: Jeani Hawking, MD;  Location: [facility] ENDOSCOPY;  Service: Endoscopy;  Laterality: N/A;   ENTEROSCOPY N/A 10/28/2018   Procedure: ENTEROSCOPY;  Surgeon: Tressia Danas, MD;  Location: [facility] ENDOSCOPY;  Service: Gastroenterology;  Laterality: N/A;   ESOPHAGOGASTRODUODENOSCOPY (EGD) WITH PROPOFOL N/A 09/05/2018   Procedure: ESOPHAGOGASTRODUODENOSCOPY (EGD) WITH PROPOFOL;  Surgeon: Benancio Deeds, MD;  Location: [facility] ENDOSCOPY;  Service: Gastroenterology;  Laterality: N/A;   EXCISION MASS HEAD     GIVENS CAPSULE STUDY N/A 09/06/2018   Procedure: GIVENS CAPSULE STUDY;  Surgeon: Tressia Danas, MD;  Location: [facility] ENDOSCOPY;  Service: Gastroenterology;  Laterality: N/A;   GIVENS CAPSULE STUDY N/A 09/26/2018   Procedure: GIVENS CAPSULE STUDY;  Surgeon: Beverley Fiedler, MD;  Location: [facility] ENDOSCOPY;  Service: Gastroenterology;  Laterality: N/A;   HOT HEMOSTASIS N/A 10/28/2018   Procedure: HOT HEMOSTASIS (ARGON PLASMA COAGULATION/BICAP);  Surgeon: Tressia Danas, MD;  Location: [facility] ENDOSCOPY;  Service: Gastroenterology;  Laterality: N/A;   INCISION AND DRAINAGE PERIRECTAL ABSCESS N/A 06/05/2017   Procedure: IRRIGATION AND DEBRIDEMENT PERIRECTAL ABSCESS;  Surgeon: Andria Meuse, MD;  Location: [facility] OR;  Service: General;  Laterality: N/A;   VIDEO BRONCHOSCOPY Bilateral 05/08/2016   Procedure: VIDEO BRONCHOSCOPY WITH FLUORO;  Surgeon: Oretha Milch, MD;  Location: [facility] ENDOSCOPY;  Service: Cardiopulmonary;  Laterality: Bilateral;        Home Medications    Prior to Admission medications   Medication Sig Start Date End Date  Taking? Authorizing Provider  albuterol (PROVENTIL HFA;VENTOLIN HFA) 108 (90 Base) MCG/ACT inhaler Inhale 2 puffs into the lungs every 6 (six) hours as needed for wheezing or shortness of breath. 05/15/18  Yes [name], MD  atorvastatin (LIPITOR) 80 MG tablet Take 1 tablet (80 mg total) by mouth daily. 09/06/18  Yes Tressia Danas, MD  carvedilol (COREG) 3.125 MG tablet Take 1 tablet (3.125 mg total) by mouth 2 (two) times daily. 05/15/18 05/15/19 Yes [name], MD  cetirizine (ZYRTEC) 10 MG tablet Take 1 tablet (10 mg total) by mouth daily. 06/18/18  Yes [name], MD  ferrous sulfate 325 (65 FE) MG tablet Take 1 tablet (325 mg total) by mouth daily with breakfast. 09/09/18  Yes Danis, Starr [place] III, MD  fluticasone furoate-vilanterol (BREO ELLIPTA) 100-25 MCG/INH AEPB Inhale 1 puff into the lungs daily. 05/15/18  Yes Newlin, Odette Horns, MD  furosemide (LASIX) 40 MG tablet TAKE 1 TABLET (40 MG TOTAL) BY MOUTH DAILY. 11/04/18 11/04/19 Yes Newlin, Odette Horns, MD  gabapentin (NEURONTIN) 300 MG capsule TAKE 1 CAPSULE (300 MG TOTAL) BY MOUTH AT BEDTIME. 11/05/18  Yes Newlin, Enobong, MD  ipratropium-albuterol (DUONEB) 0.5-2.5 (3) MG/3ML SOLN Take 3 mLs by nebulization every 6 (  six) hours as needed. Patient taking differently: Take 3 mLs by nebulization every 6 (six) hours as needed (SOB).  09/25/18  Yes [name], MD  losartan (COZAAR) 50 MG tablet Take 1 tablet (50 mg total) by mouth daily. 05/15/18  Yes [name], MD  pantoprazole (PROTONIX) 40 MG tablet Take 1 tablet (40 mg total) by mouth daily at 6 (six) AM. 09/07/18  Yes Glade Lloyd, MD  rivaroxaban (XARELTO) 20 MG TABS tablet Take 20 mg by mouth daily with supper.   Yes [provider]    Family History Family History  Problem Relation Age of Onset   Heart disease Mother    Diabetes Mother    Colon cancer Mother    Liver cancer Mother    Cancer Father        type unknown   Diabetes Sister        x 2    Diabetes Brother     Social History Social History   Tobacco Use   Smoking status: Current Every Day Smoker    Packs/day: 0.50    Years: 42.00    Pack years: 21.00    Types: Cigarettes   Smokeless tobacco: Never Used   Tobacco comment: 3 cigs daily  Substance Use Topics   Alcohol use: Yes    Alcohol/week: 0.0 standard drinks    Comment: last drink was before xmas   Drug use: No    Types: Cocaine, Marijuana    Comment: "nothing in 20 years"     Allergies   Lisinopril   Review of Systems Review of Systems  Constitutional: Negative for appetite change, chills and fever.  HENT: Negative for congestion and sore throat.   Eyes: Negative for visual disturbance.  Respiratory: Positive for shortness of breath and wheezing.   Cardiovascular: Positive for chest pain (resolved). Negative for palpitations and leg swelling.  Gastrointestinal: Negative for abdominal pain, diarrhea, nausea and vomiting.  Genitourinary: Negative for dysuria.  Musculoskeletal: Negative for back pain.  Skin: Negative for rash.  Allergic/Immunologic: Negative for immunocompromised state.  Neurological: Negative for dizziness, weakness, numbness and headaches.  Psychiatric/Behavioral: Negative for confusion.   Physical Exam Updated Vital Signs BP 132/61    Pulse 90    Temp 98.3 F (36.8 C) (Oral)    Resp 18    Ht 6\' 3"  (1.905 m)    Wt 88.6 kg    SpO2 95%    BMI 24.41 kg/m   Physical Exam Vitals signs and nursing note reviewed.  Constitutional:      General: He is not in acute distress.    Appearance: He is well-developed. He is ill-appearing. He is not toxic-appearing or diaphoretic.     Interventions: He is not intubated. HENT:     Head: Normocephalic.  Eyes:     Conjunctiva/sclera: Conjunctivae normal.  Neck:     Musculoskeletal: Neck supple.  Cardiovascular:     Rate and Rhythm: Normal rate and regular rhythm.  No extrasystoles are present.    Pulses: No decreased pulses.     Heart  sounds: No murmur. No friction rub. No gallop.   Pulmonary:     Effort: Pulmonary effort is normal. He is not intubated.     Breath sounds: Decreased breath sounds and wheezing present. No rhonchi or rales.     Comments: [place] in place. He is belly breathing. No nasal flaring. No accessory muscle use.  Chest:     Chest wall: No deformity, tenderness or crepitus.  Abdominal:  General: There is no distension.     Palpations: Abdomen is soft.  Musculoskeletal:     Comments: Trace edema of the bilateral lower extremities. No redness or warmth.   Skin:    General: Skin is warm and dry.  Neurological:     Comments: Somnolent, but arouses to loud voice. Alert and oriented x3.   Psychiatric:        Behavior: Behavior normal.      ED Treatments / Results  Labs (all labs ordered are listed, but only abnormal results are displayed) Labs Reviewed  BLOOD GAS, ARTERIAL - Abnormal; Notable for the following components:      Result Value   pH, Arterial 7.179 (*)    pO2, Arterial 121 (*)    All other components within normal limits  COMPREHENSIVE METABOLIC PANEL - Abnormal; Notable for the following components:   Chloride 97 (*)    CO2 42 (*)    Glucose, Bld 171 (*)    Calcium 8.7 (*)    Total Bilirubin 0.2 (*)    All other components within normal limits  CBC WITH DIFFERENTIAL/PLATELET - Abnormal; Notable for the following components:   RBC 3.45 (*)    Hemoglobin 9.1 (*)    HCT 35.3 (*)    MCV 102.3 (*)    MCHC 25.8 (*)    All other components within normal limits  BRAIN NATRIURETIC PEPTIDE - Abnormal; Notable for the following components:   B Natriuretic Peptide 238.4 (*)    All other components within normal limits  BLOOD GAS, ARTERIAL - Abnormal; Notable for the following components:   pH, Arterial 7.157 (*)    pO2, Arterial 110 (*)    All other components within normal limits  BLOOD GAS, ARTERIAL - Abnormal; Notable for the following components:   pH, Arterial 7.302 (*)     pCO2 arterial 92.3 (*)    pO2, Arterial 57.3 (*)    Bicarbonate 44.3 (*)    Acid-Base Excess 15.4 (*)    All other components within normal limits  BASIC METABOLIC PANEL - Abnormal; Notable for the following components:   Chloride 96 (*)    CO2 38 (*)    Glucose, Bld 207 (*)    Calcium 8.7 (*)    All other components within normal limits  CBC - Abnormal; Notable for the following components:   RBC 3.31 (*)    Hemoglobin 8.8 (*)    HCT 32.8 (*)    MCHC 26.8 (*)    All other components within normal limits  SARS CORONAVIRUS 2 (HOSPITAL ORDER, PERFORMED IN [facility])  MRSA PCR SCREENING  TROPONIN I  RAPID URINE DRUG SCREEN, HOSP PERFORMED    EKG EKG Interpretation  Date/Time:  Sunday Nov 09 2018 05:01:27 EDT Ventricular Rate:  89 PR Interval:    QRS Duration: 95 QT Interval:  378 QTC Calculation: 460 R Axis:   -69 Text Interpretation:  Sinus rhythm Multiple premature complexes, vent & supraven Inferior infarct, old Confirmed by Zadie Rhine ([id number]) on 11/09/2018 5:07:25 AM   Radiology Ct Maxillofacial W Contrast  Result Date: 11/09/2018 CLINICAL DATA:  Left parotid gland enlargement. EXAM: CT MAXILLOFACIAL WITH CONTRAST TECHNIQUE: Multidetector CT imaging of the maxillofacial structures was performed with intravenous contrast. Multiplanar CT image reconstructions were also generated. CONTRAST:  75mL OMNIPAQUE IOHEXOL 300 MG/ML  SOLN COMPARISON:  None. FINDINGS: Osseous: No facial fracture. Dental: There is a periapical lucency at the root of tooth 3, at the floor of  the maxillary sinus. Orbits: The globes are intact. Normal appearance of the intra- and extraconal fat. Symmetric extraocular muscles. Sinuses: Partial opacification of right maxillary sinus with fluid level. Soft tissues: The left parotid gland is enlarged and edematous with inflammatory induration of the overlying subcutaneous tissues. Stensen's duct is dilated, measuring 8 mm. No sialolith is  identified. However, there is an abrupt caliber change just lateral to the facial vein and posterior to the zygomaticus major muscle (3:41, 5:36, 6:75). There is an intermediate attenuation focus at this level, but below the expected density of a stone. Limited intracranial: Normal. IMPRESSION: 1. Sialoadenitis of the left parotid gland with induration of the overlying subcutaneous tissues. The parotid duct is dilated with a transition point at the distal third of the duct, just posterior to the zygomaticus major muscle and lateral to the facial vein. 2. No sialolithiasis or definite source of obstruction. There is an intermediate attenuation focus at the level of the caliber change, which may simply be an adjacent vessel, but is indeterminate. 3. Odontogenic right maxillary sinusitis with fluid level. Suspected source is the periapical lucency at the root of tooth 3. Electronically Signed   By: Deatra Robinson M.D.   On: 11/09/2018 19:10   Dg Chest Port 1 View  Result Date: 11/09/2018 CLINICAL DATA:  Weakness, shortness of breath EXAM: PORTABLE CHEST 1 VIEW COMPARISON:  10/27/2018 FINDINGS: Prominent right infrahilar markings, reflecting normal pulmonary vasculature. No focal consolidation. No pleural effusion or pneumothorax. The heart is top-normal in size. IMPRESSION: No evidence of acute cardiopulmonary disease. Electronically Signed   By: Charline Bills M.D.   On: 11/09/2018 05:33    Procedures .Critical Care Performed by: Barkley Boards, PA-C Authorized by: Barkley Boards, PA-C   Critical care provider statement:    Critical care time (minutes):  40   Critical care time was exclusive of:  Separately billable procedures and treating other patients and teaching time   Critical care was necessary to treat or prevent imminent or life-threatening deterioration of the following conditions:  Respiratory failure   Critical care was time spent personally by me on the following activities:  Ordering  and review of laboratory studies, ordering and review of radiographic studies, ordering and performing treatments and interventions, pulse oximetry, re-evaluation of patient's condition, review of old charts, examination of patient, evaluation of patient's response to treatment, development of treatment plan with patient or surrogate and obtaining history from patient or surrogate   (including critical care time)  Medications Ordered in ED Medications  gabapentin (NEURONTIN) capsule 300 mg (300 mg Oral Not Given 11/09/18 2215)  hydrALAZINE (APRESOLINE) injection 5 mg (has no administration in time range)  0.9 %  sodium chloride infusion ( Intravenous Rate/Dose Verify 11/09/18 1857)  acetaminophen (TYLENOL) tablet 650 mg (has no administration in time range)    Or  acetaminophen (TYLENOL) suppository 650 mg (has no administration in time range)  ondansetron (ZOFRAN) tablet 4 mg (has no administration in time range)    Or  ondansetron (ZOFRAN) injection 4 mg (has no administration in time range)  methylPREDNISolone sodium succinate (SOLU-MEDROL) 40 mg/mL injection 40 mg (40 mg Intravenous Given 11/10/18 0745)  enoxaparin (LOVENOX) injection 90 mg (90 mg Subcutaneous Given 11/10/18 0745)  albuterol (PROVENTIL) (2.5 MG/3ML) 0.083% nebulizer solution 2.5 mg (has no administration in time range)  budesonide (PULMICORT) nebulizer solution 0.5 mg (0.5 mg Nebulization Given 11/09/18 2009)  pantoprazole (PROTONIX) injection 40 mg (40 mg Intravenous Given 11/09/18 2215)  ipratropium-albuterol (DUONEB)  0.5-2.5 (3) MG/3ML nebulizer solution 3 mL (3 mLs Nebulization Given 11/10/18 0110)  Chlorhexidine Gluconate Cloth 2 % PADS 6 each (6 each Topical Given 11/09/18 1530)  MEDLINE mouth rinse (15 mLs Mouth Rinse Given 11/09/18 2215)  carvedilol (COREG) tablet 3.125 mg (3.125 mg Oral Given 11/09/18 2214)  losartan (COZAAR) tablet 50 mg (50 mg Oral Given 11/09/18 1840)  sodium chloride (PF) 0.9 % injection (has no  administration in time range)  methylPREDNISolone sodium succinate (SOLU-MEDROL) 125 mg/2 mL injection 125 mg (125 mg Intravenous Given 11/09/18 0555)  iohexol (OMNIPAQUE) 300 MG/ML solution 75 mL (75 mLs Intravenous Contrast Given 11/09/18 1818)     Initial Impression / Assessment and Plan / ED Course  I have reviewed the triage vital signs and the nursing notes.  Pertinent labs & imaging results that were available during my care of the patient were reviewed by me and considered in my medical decision making (see chart for details).  63 year old male with a history of COPD, chronic respiratory failure with hypoxia on 3L home O2 at rest and 4L with exertion. CAD, chronic combined systolic and diastolic CHF (EF 14-78% from ECHO 11/19 improved from 30-35% in 4/18), and CVA who presents to the emergency department with worsening shortness of breath and wheezing over the last 2 weeks.  No constitutional symptoms.  And chest pain a week ago, but no recurrent episodes of chest pain.  On arrival, patient is drowsy, but arouses to loud voice.  He is alert and oriented x3.  He is on his home 3 L of O2.  On exam, lung sounds are diminished in all field and there are inspiratory and expiratory wheezes.  No rhonchi or rales.  Minimal pedal edema in the bilateral lower extremities.  The patient was given 8 puffs of albuterol inhaler and 125 mg of Solu-Medrol.   ABG with 7.179 with significantly elevated CO2 of 122. Will plan to initiate BiPap after COVID-19 test has resulted.  Serum bicarb is elevated to 42, up from 38.  The patient was seen and independently evaluated by Dr. Bebe Shaggy, attending physician. Nursing notified Dr. Bebe Shaggy the patient was refusing COVID-19 testing.   Clinical Course as of Nov 09 753  Sun Nov 09, 2018  0718 Patient was initially refusing COVID-19 swab. After lengthy discussion with the patient, he was agreeable. He may require Afrin following testing as he developed epistaxis after  previous COVID-19 testing. Plan to start Bipap after COVID-19 test has resulted with admission.    [MM]  [phone] On reassessment, patient is ANO x3.  He is drowsy, however answers all questions.  He is on 3 L nasal cannula oxygenating 100%.  Will repeat blood gas and begin BiPAP now that COVID test is negative.  Will consult critical care considering initial ABG.   [AL]    Clinical Course User Index [AL] Emi Holes, PA-C [MM] Priscilla Finklea A, PA-C   Hemoglobin is 9.1, improved from previous of 8.6.  No leukocytosis.  Chest x-ray with prominent right infrahilar markings, reflecting normal pulmonary vasculature.  Heart is top normal in size.  Otherwise unremarkable.  EKG with sinus rhythm and multiple premature complexes.  Troponin is negative.  Labs are otherwise grossly unremarkable.  The patient's presentation is consistent with acute on chronic respiratory failure with hypercapnia.   Patient care transferred to PA Law at the end of my shift pending COVID-19 testing and initiation of BiPAP and ultimately hospital admission. Patient presentation, ED course, and plan of care  discussed with review of all pertinent labs and imaging. Please see his/her note for further details regarding further ED course and disposition.     Final Clinical Impressions(s) / ED Diagnoses   Final diagnoses:  Acute on chronic respiratory failure with hypercapnia [facility])    ED Discharge Orders    None       Barkley Boards, PA-C 11/09/18 0741    Frederik Pear A, PA-C 11/10/18 0756    Zadie Rhine, MD 11/11/18 [id number]

## 2018-11-09 NOTE — ED Triage Notes (Signed)
Pt reports having increasing weakness and shortness of breath. Pt on 3L O2 via [place] at all times. Pt states weakness has progressively gotten worse over the last several days.

## 2018-11-09 NOTE — ED Provider Notes (Signed)
Signout from previous provider, Frederik Pear, PA-C at shift change See previous providers note for full H&P  Briefly, patient presenting with a severe COPD exacerbation.  Chest x-ray is clear.  ABG shows pH 7.179, CO2 above reportable range, and PO2 121.  Patient needs BiPAP, however COVID test required before BiPAP, plan to admit. Chronic 3L. Drowsy, however alert and oriented.  Solu-Medrol and albuterol inhaler puffs given. Physical Exam  BP 128/74   Pulse 77   Temp 98.9 F (37.2 C) (Oral)   Resp (!) 23   Ht 6\' 3"  (1.905 m)   Wt 89.4 kg   SpO2 99%   BMI 24.62 kg/m   Physical Exam Vitals signs and nursing note reviewed.  Constitutional:      General: He is not in acute distress.    Appearance: He is well-developed. He is not diaphoretic.  HENT:     Head: Normocephalic and atraumatic.     Mouth/Throat:     Pharynx: No oropharyngeal exudate.  Eyes:     General: No scleral icterus.       Right eye: No discharge.        Left eye: No discharge.     Conjunctiva/sclera: Conjunctivae normal.     Pupils: Pupils are equal, round, and reactive to light.  Neck:     Musculoskeletal: Normal range of motion and neck supple.     Thyroid: No thyromegaly.  Cardiovascular:     Rate and Rhythm: Normal rate and regular rhythm.     Heart sounds: Normal heart sounds. No murmur. No friction rub. No gallop.   Pulmonary:     Effort: Pulmonary effort is normal. No respiratory distress.     Breath sounds: No stridor. Wheezing present. No rales.  Abdominal:     General: There is no distension.  Lymphadenopathy:     Cervical: No cervical adenopathy.  Skin:    General: Skin is warm and dry.     Coloration: Skin is not pale.     Findings: No rash.  Neurological:     Mental Status: He is alert.     Coordination: Coordination normal.     Comments: A&O x3     ED Course/Procedures   Clinical Course as of Nov 08 1200  Sun Nov 09, 2018  0630 Patient was initially refusing COVID-19 swab. After  lengthy discussion with the patient, he was agreeable. He may require Afrin following testing as he developed epistaxis after previous COVID-19 testing. Plan to start Bipap after COVID-19 test has resulted with admission.    [MM]  [phone] On reassessment, patient is ANO x3.  He is drowsy, however answers all questions.  He is on 3 L nasal cannula oxygenating 100%.  Will repeat blood gas and begin BiPAP now that COVID test is negative.  Will consult critical care considering initial ABG.   [AL]    Clinical Course User Index [AL] Emi Holes, PA-C [MM] McDonald, Mia A, PA-C    .Critical Care Performed by: Emi Holes, PA-C Authorized by: Emi Holes, PA-C   Critical care provider statement:    Critical care time (minutes):  45   Critical care was necessary to treat or prevent imminent or life-threatening deterioration of the following conditions:  Respiratory failure   Critical care was time spent personally by me on the following activities:  Discussions with consultants, evaluation of patient's response to treatment, examination of patient, ordering and performing treatments and interventions, ordering and review of laboratory studies,  ordering and review of radiographic studies, pulse oximetry, re-evaluation of patient's condition, obtaining history from patient or surrogate and review of old charts   I assumed direction of critical care for this patient from another provider in my specialty: yes      MDM  COVID negative.  Repeat ABG prior to BiPAP shows 7.157 pH, CO2 above reportable range, PO2 110.  BiPAP ordered and I discussed patient case with intensivist, Dr. Craige Cotta, who will consult and asked admission of hospitalist.  I spoke with Dr. Caleb Popp with [facility] who accepts patient for admission.  I appreciate the above consultants for their assistance.  Patient also evaluated by my attending, Dr. Freida Busman, who guided the patient's management and agrees with plan.       Emi Holes, PA-C 11/09/18 1206    Lorre Nick, MD 11/15/18 1747

## 2018-11-09 NOTE — ED Notes (Signed)
RN Fredric Mare is providing care for another patient and will call RN Lucrezia Europe back to receive report ASAP.

## 2018-11-09 NOTE — ED Notes (Signed)
RT has been called to assist in transporting patient to ICU. RT is in route ASAP.

## 2018-11-09 NOTE — Progress Notes (Signed)
Called to bedside for new onset left neck/facial swelling. Upon examination, patient has significant swelling over his left parotid gland. On oral inspection, could not visualize duct. Swelling is not significantly tender on examination. This has apparently happened in the past and resolved spontaneously. Therefor, favor possible sialadenitis vs obstruction of the parotid gland duct. No fever, chills, etc. Will try to obtain ultrasound, however, if this cannot be done, will obtain CT maxillofacial with contrast. No concern for allergic reaction on evaluation, but discussed with nurse to watch for hypotension, tongue swelling, etc  Jacquelin Hawking, MD [facility] 11/09/2018, 5:27 PM

## 2018-11-09 NOTE — ED Notes (Signed)
Patient placed on bedside commode.

## 2018-11-09 NOTE — ED Notes (Signed)
Pt refusing Covid swab. MD made aware

## 2018-11-09 NOTE — Progress Notes (Signed)
ANTICOAGULATION CONSULT NOTE   Pharmacy Consult for lovenox Indication: hx atrial fibrillation  Allergies  Allergen Reactions  . Lisinopril Cough    Patient Measurements: Height: 6\' 3"  (190.5 cm) Weight: 197 lb (89.4 kg) IBW/kg (Calculated) : 84.5 Heparin Dosing Weight:   Vital Signs: Temp: 98.9 F (37.2 C) (05/31 0439) Temp Source: Oral (05/31 0439) BP: 122/70 (05/31 1000) Pulse Rate: 65 (05/31 1009)  Labs: Recent Labs    11/09/18 0529  HGB 9.1*  HCT 35.3*  PLT 329  CREATININE 0.77  TROPONINI <0.03    Estimated Creatinine Clearance: 110 mL/min (by C-G formula based on SCr of 0.77 mg/dL).   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  To transition anticoagulation for patient to lovenox.  Goal of Therapy:  Anti-Xa level 0.6-1 units/ml 4hrs after LMWH dose given Monitor platelets by anticoagulation protocol: Yes   Plan:  - lovenox 90 mg SQ q12h (first dose at 8p tonight - 24 hrs from last dose of xarelto) - cbc q72h - monitor for s/s bleeding  Jared [name] P 11/09/2018,11:14 AM

## 2018-11-10 DIAGNOSIS — J441 Chronic obstructive pulmonary disease with (acute) exacerbation: Secondary | ICD-10-CM

## 2018-11-10 DIAGNOSIS — I483 Typical atrial flutter: Secondary | ICD-10-CM

## 2018-11-10 DIAGNOSIS — J9621 Acute and chronic respiratory failure with hypoxia: Secondary | ICD-10-CM | POA: Diagnosis present

## 2018-11-10 DIAGNOSIS — J9622 Acute and chronic respiratory failure with hypercapnia: Secondary | ICD-10-CM

## 2018-11-10 DIAGNOSIS — F17200 Nicotine dependence, unspecified, uncomplicated: Secondary | ICD-10-CM

## 2018-11-10 DIAGNOSIS — Z7901 Long term (current) use of anticoagulants: Secondary | ICD-10-CM

## 2018-11-10 DIAGNOSIS — I5042 Chronic combined systolic (congestive) and diastolic (congestive) heart failure: Secondary | ICD-10-CM

## 2018-11-10 DIAGNOSIS — I10 Essential (primary) hypertension: Secondary | ICD-10-CM

## 2018-11-10 LAB — BASIC METABOLIC PANEL
Anion gap: 7 (ref 5–15)
BUN: 12 mg/dL (ref 8–23)
CO2: 38 mmol/L — ABNORMAL HIGH (ref 22–32)
Calcium: 8.7 mg/dL — ABNORMAL LOW (ref 8.9–10.3)
Chloride: 96 mmol/L — ABNORMAL LOW (ref 98–111)
Creatinine, Ser: 0.69 mg/dL (ref 0.61–1.24)
GFR calc Af Amer: >60 mL/min (ref >60)
GFR calc non Af Amer: >60 mL/min (ref >60)
Glucose, Bld: 207 mg/dL — ABNORMAL HIGH (ref 70–99)
Potassium: 4.2 mmol/L (ref 3.5–5.1)
Sodium: 141 mmol/L (ref 135–145)

## 2018-11-10 LAB — CBC
HCT: 32.8 % — ABNORMAL LOW (ref 39.0–52.0)
Hemoglobin: 8.8 g/dL — ABNORMAL LOW (ref 13.0–17.0)
MCH: 26.6 pg (ref 26.0–34.0)
MCHC: 26.8 g/dL — ABNORMAL LOW (ref 30.0–36.0)
MCV: 99.1 fL (ref 80.0–100.0)
Platelets: 298 10*3/uL (ref 150–400)
RBC: 3.31 MIL/uL — ABNORMAL LOW (ref 4.22–5.81)
RDW: 14.8 % (ref 11.5–15.5)
WBC: 10.2 10*3/uL (ref 4.0–10.5)
nRBC: 0 % (ref 0.0–0.2)

## 2018-11-10 MED ORDER — FUROSEMIDE 40 MG PO TABS
40.0000 mg | ORAL_TABLET | Freq: Every day | ORAL | Status: DC
  Administered 2018-11-10 – 2018-11-11 (×2): 40 mg via ORAL
  Filled 2018-11-10 (×2): qty 1

## 2018-11-10 MED ORDER — AMOXICILLIN-POT CLAVULANATE 875-125 MG PO TABS
1.0000 | ORAL_TABLET | Freq: Two times a day (BID) | ORAL | Status: DC
  Administered 2018-11-10 – 2018-11-11 (×3): 1 via ORAL
  Filled 2018-11-10 (×3): qty 1

## 2018-11-10 MED ORDER — SALINE SPRAY 0.65 % NA SOLN
2.0000 | Freq: Two times a day (BID) | NASAL | Status: DC
  Administered 2018-11-10 – 2018-11-11 (×2): 2 via NASAL
  Filled 2018-11-10 (×2): qty 44

## 2018-11-10 MED ORDER — NICOTINE 14 MG/24HR TD PT24
14.0000 mg | MEDICATED_PATCH | Freq: Every day | TRANSDERMAL | Status: DC
  Administered 2018-11-10 – 2018-11-11 (×2): 14 mg via TRANSDERMAL
  Filled 2018-11-10 (×2): qty 1

## 2018-11-10 MED ORDER — IPRATROPIUM-ALBUTEROL 0.5-2.5 (3) MG/3ML IN SOLN
3.0000 mL | Freq: Two times a day (BID) | RESPIRATORY_TRACT | Status: DC
  Administered 2018-11-10 – 2018-11-11 (×3): 3 mL via RESPIRATORY_TRACT
  Filled 2018-11-10 (×2): qty 3

## 2018-11-10 MED ORDER — PANTOPRAZOLE SODIUM 40 MG PO TBEC
40.0000 mg | DELAYED_RELEASE_TABLET | Freq: Two times a day (BID) | ORAL | Status: DC
  Administered 2018-11-10 – 2018-11-11 (×2): 40 mg via ORAL
  Filled 2018-11-10 (×2): qty 1

## 2018-11-10 MED ORDER — SODIUM CHLORIDE 0.9 % IV SOLN: INTRAVENOUS | Status: DC | PRN

## 2018-11-10 MED ORDER — RIVAROXABAN 20 MG PO TABS
20.0000 mg | ORAL_TABLET | Freq: Every day | ORAL | Status: DC
  Administered 2018-11-10: 20 mg via ORAL
  Filled 2018-11-10: qty 1

## 2018-11-10 MED ORDER — FLUTICASONE PROPIONATE 50 MCG/ACT NA SUSP
1.0000 | Freq: Every day | NASAL | Status: DC
  Administered 2018-11-11: 1 via NASAL
  Filled 2018-11-10: qty 16

## 2018-11-10 MED ORDER — ARFORMOTEROL TARTRATE 15 MCG/2ML IN NEBU
15.0000 ug | INHALATION_SOLUTION | Freq: Two times a day (BID) | RESPIRATORY_TRACT | Status: DC
  Administered 2018-11-10 – 2018-11-11 (×3): 15 ug via RESPIRATORY_TRACT
  Filled 2018-11-10 (×3): qty 2

## 2018-11-10 NOTE — Progress Notes (Signed)
Patient up to BR, states he then got sudden sharp pain in the back if his head and neck radiating to the right front of head.  Called out asking for tylenol and said he felt SOB.  When I arrived to room, patient in obvious pain.  VS obtained and WNL other than BP being high, possibly due to 10/10 pain.  Neuro check intact.  No changes noted to previous L neck swelling and patient denies pain in this area. PRN tylenol given  - Dr Arlean Hopping paged via [facility], awaiting return call

## 2018-11-10 NOTE — Progress Notes (Signed)
Called pharmacy to confirm it was okay to give xarelto at 5pm tonight after receiving lovenox this morning. Pharmacy said to given xarelto as scheduled.

## 2018-11-10 NOTE — Progress Notes (Signed)
NAME:  Jared [name], MRN:  [id number], DOB:  Oct 26, 1955, LOS: 1 ADMISSION DATE:  11/09/2018, CONSULTATION DATE:  11/09/2018 REFERRING MD:  Dr. Caleb Popp, [facility], CHIEF COMPLAINT:  Short of breath   Brief History   63 yo male smoker presented to ER with dyspnea, weakness, confusion, hypoxia from COPD exacerbation with acute on chronic hypoxia and hypercapnia.  Followed by Dr. [name] in pulmonary office.  Was in hospital 5/18 to 5/19 with bleeding duodenal AVM.  Past Medical History  COPD with emphysema, Nonischemic CM, HTN, CVA, Cluster headaches, Diastolic CHF, CAD, PNA, duodenal AVM, A flutter on xarelto  Significant Hospital Events   5/31 Admit with SOB, weakness, confusion, hypoxia  Consults:    Procedures:    Significant Diagnostic Tests:  Spirometry 10/22/16 >> FEV1 1.2 (35%), FEV1% 48 Echo 05/02/18 >> EF 45 to 50%, grade 1 DD CT angio chest 05/13/18 >> coronary calcification, moderate centrilobular emphysema, micronodularity of liver UDS 5/31 >>negative   Micro Data:  COVID 5/31 >> negative  Antimicrobials:    Interim history/subjective:  Afebrile.  I/O- ~555ml + for last 24 hours. 3L O2 (home requirement). Pt reports feeling better but not back to baseline. States prior to admit he had increased yellosinus drainage.  Objective   Blood pressure 132/61, pulse 90, temperature 98.3 F (36.8 C), temperature source Oral, resp. rate 18, height 6\' 3"  (1.905 m), weight 88.6 kg, SpO2 95 %.    Vent Mode: PCV;BIPAP FiO2 (%):  [30 %-36 %] 36 % Set Rate:  [18 bmp] 18 bmp PEEP:  [5 cmH20] 5 cmH20   Intake/Output Summary (Last 24 hours) at 11/10/2018 0815 Last data filed at 11/10/2018 0600 Gross per 24 hour  Intake 1541.14 ml  Output 1050 ml  Net 491.14 ml   Filed Weights   11/09/18 0439 11/09/18 1537 11/10/18 0700  Weight: 89.4 kg 87.7 kg 88.6 kg    Examination: General: adult male sitting up in bed in NAD HEENT: MM pink/moist, [place] O2  Neuro: AAOx4, speech clear, MAE CV: s1s2  rrr, no m/r/g PULM: even/non-labored, lungs bilaterally diminished NL:GXQJ, non-tender, bsx4 active  Extremities: warm/dry, no edema  Skin: no rashes or lesions  Resolved Hospital Problem list     Assessment & Plan:   Acute on chronic hypoxic, hypercapnic respiratory failure from COPD exacerbation. Severe COPD with emphysema. Tobacco abuse. P: Wean O2 for saturations of 90-95% PRN BiPAP Follow intermittent CXR  Scheduled BD's - pulmicort + brovana, duoneb BID, Q4 PRN albuterol  Solumedrol 40 mg IV BID  Pulmonary hygiene -IS, mobilize  Add nicotine patch Smoking cessation counseling  Will need pulmonary follow up at discharge   Hx of CAD, Non ischemic CM, CVA, A flutter, HTN, HLD. 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 P: Follow Hgb  PPI BID   Best practice:  Diet: Heart healthy diet DVT prophylaxis: lovenox GI prophylaxis: Protonix Mobility: bed rest Code Status: full code Disposition: SDU  Labs   CBC: Recent Labs  Lab 11/03/18 1032 11/09/18 0529 11/10/18 0327  WBC 6.3 6.6 10.2  NEUTROABS  --  4.2  --   HGB 8.6* 9.1* 8.8*  HCT 31.7* 35.3* 32.8*  MCV 97.2 102.3* 99.1  PLT 308 329 298    Basic Metabolic Panel: Recent Labs  Lab 11/03/18 1032 11/09/18 0529 11/10/18 0327  NA 143 145 141  K 3.7 3.6 4.2  CL 96* 97* 96*  CO2 38* 42* 38*  GLUCOSE 141* 171* 207*  BUN  11 15 12   CREATININE 0.84 0.77 0.69  CALCIUM 8.5* 8.7* 8.7*   GFR: Estimated Creatinine Clearance: 110 mL/min (by C-G formula based on SCr of 0.69 mg/dL). Recent Labs  Lab 11/03/18 1032 11/09/18 0529 11/10/18 0327  WBC 6.3 6.6 10.2    Liver Function Tests: Recent Labs  Lab 11/09/18 0529  AST 17  ALT 14  ALKPHOS 58  BILITOT 0.2*  PROT 6.8  ALBUMIN 3.5   No results for input(s): LIPASE, AMYLASE in the last 168 hours. No results for input(s): AMMONIA in the last 168 hours.  ABG    Component Value Date/Time   PHART 7.302 (L) 11/09/2018 1145    PCO2ART 92.3 (HH) 11/09/2018 1145   PO2ART 57.3 (L) 11/09/2018 1145   HCO3 44.3 (H) 11/09/2018 1145   TCO2 45 (H) 09/04/2018 1646   O2SAT 90.1 11/09/2018 1145     Coagulation Profile: No results for input(s): INR, PROTIME in the last 168 hours.  Cardiac Enzymes: Recent Labs  Lab 11/09/18 0529  TROPONINI <0.03    HbA1C: Hemoglobin A1C  Date/Time Value Ref Range Status  05/14/2016 04:55 PM 6.3  Final   Hgb A1c MFr Bld  Date/Time Value Ref Range Status  10/03/2013 12:00 PM 6.2 (H) <5.7 % Final    Comment:    (NOTE)                                                                       According to the ADA Clinical Practice Recommendations for 2011, when HbA1c is used as a screening test:  >=6.5%   Diagnostic of Diabetes Mellitus           (if abnormal result is confirmed) 5.7-6.4%   Increased risk of developing Diabetes Mellitus References:Diagnosis and Classification of Diabetes Mellitus,Diabetes Care,2011,34(Suppl 1):S62-S69 and Standards of Medical Care in         Diabetes - 2011,Diabetes Care,2011,34 (Suppl 1):S11-S61.    CBG: No results for input(s): GLUCAP in the last 168 hours.   Canary Brim, NP-C [facility] Pgr: [phone] or if no answer [phone] 11/10/2018, 8:15 AM

## 2018-11-10 NOTE — Progress Notes (Signed)
[facility] Progress Note  Subjective: feeling better, off Bipap, alert and talkative  Vitals:   11/10/18 0600 11/10/18 0700 11/10/18 0800 11/10/18 0818  BP: 132/61  (!) 151/86   Pulse:      Resp: 18  12   Temp:   97.6 F (36.4 C)   TempSrc:   Oral   SpO2:    97%  Weight:  88.6 kg    Height:        Inpatient medications: . arformoterol  15 mcg Nebulization BID  . budesonide (PULMICORT) nebulizer solution  0.5 mg Nebulization BID  . carvedilol  3.125 mg Oral BID  . Chlorhexidine Gluconate Cloth  6 each Topical Daily  . enoxaparin (LOVENOX) injection  90 mg Subcutaneous Q12H  . gabapentin  300 mg Oral QHS  . ipratropium-albuterol  3 mL Nebulization BID  . losartan  50 mg Oral Daily  . mouth rinse  15 mL Mouth Rinse BID  . methylPREDNISolone (SOLU-MEDROL) injection  40 mg Intravenous BID  . nicotine  14 mg Transdermal Daily  . pantoprazole (PROTONIX) IV  40 mg Intravenous Q12H   . sodium chloride 60 mL/hr at 11/09/18 1857   acetaminophen **OR** acetaminophen, albuterol, hydrALAZINE, ondansetron **OR** ondansetron (ZOFRAN) IV  Exam: Alert, calm, no distress, nasal O2  no jvd Chest scattered wheezing, poor air movement bilat , no ^wob  Cor reg no RG  Abd soft ntnd +bs  Ext no edema  NF, Ox 3   Presentation Summary: Jared [name] is a 63 y.o. male with medical history significant of CAD, chronic combined systolic and diastolic heart failure, history of CVA, hypertension, COPD, tobacco abuse. History difficult to obtain secondary to patient being on BiPAP. Patient presented secondary to worsening shortness of breath. No associated fever, cough, chest pain, sick contacts.   ED Course: Vitals: Afebrile, pulse of 70s, respirations of 18-19, SpO2 in low 90s on 3L via nasal canula Labs: ABG 7.179/above reportable range/121 -> 7.157/above reportable range/110, CO2 42, BNP of 238, hemoglobin of 9.1, MCV of 102.3 Imaging: Chest x-ray without acute disease Medications/Course:  Nasal cannula, Solu-Medrol   Home meds:  - furosemide 40 qd/ losartan 50 qd/ carvedilol 3.125 bid  - atorvastatin 80 qd/ pantoprazole 40 hs  - duoneb qid prn/ albuterol hfa prn/ breo ellipta 1 puff qd  - rivaroxaban 20 hs  - gabapentin 300 hs      Hospital Problems/ Course:  Acute on chronic respiratory failure with hypercapnia and hypoxia/ COPD exacerbation: patient appears to be a likely chronic retainer from review of BMPs. Concern for COPD exacerbation. CXR clear. Lethargic on admission, imrpoved w/ BiPAP. CCM saw pt in consultation.  - poor air movement but patient wide awake and alert and no ^wob, exam worse than patient looks - wean O2 support keep for sats 90- 95% - cont BD's pulmicort + brovana, duoneb BID, Q4 PRN albuterol  - cont solumedrol 40 IV bid - transfer to tele bed - will need pulm f/u at dc  Tobacco use:  - ordered nicotine patch and counseled to stop smoking  Chronic combined systolic/diastolic heart failure Last Transthoracic Echocardiogram from 04/2018 significant for an EF of 45-50% and grade 1 diastolic dysfunction. Appears euvolemic. Currently NPO - resume po lasix 40 qd  Atrial flutter - Rate controlled - cont Coreg - Xarelto resumed now that off Bipap  Hypertension: BP's normal to high here - cont Coreg and losartan as outpatient - Hydralazine IV prn   DVT prophylaxis: Xarelto Code Status:  Full code Family Communication: None Disposition Plan: Stepdown > transfer to tele bed Consults called: PCCM Admission status: Inpatient     Vinson Moselle / [facility] [phone] 11/10/2018, 10:59 AM   Recent Labs  Lab 11/09/18 0529 11/10/18 0327  NA 145 141  K 3.6 4.2  CL 97* 96*  CO2 42* 38*  GLUCOSE 171* 207*  BUN 15 12  CREATININE 0.77 0.69  CALCIUM 8.7* 8.7*   Recent Labs  Lab 11/09/18 0529  AST 17  ALT 14  ALKPHOS 58  BILITOT 0.2*  PROT 6.8  ALBUMIN 3.5   Recent Labs  Lab 11/09/18 0529 11/10/18 0327  WBC 6.6 10.2  NEUTROABS  4.2  --   HGB 9.1* 8.8*  HCT 35.3* 32.8*  MCV 102.3* 99.1  PLT 329 298   Iron/TIBC/Ferritin/ %Sat    Component Value Date/Time   IRON 59 09/25/2018 1235   TIBC 375 09/25/2018 1235   FERRITIN 6 (L) 09/25/2018 1235   IRONPCTSAT 16 (L) 09/25/2018 1235

## 2018-11-11 ENCOUNTER — Inpatient Hospital Stay (HOSPITAL_COMMUNITY): Payer: Medicare HMO

## 2018-11-11 DIAGNOSIS — J441 Chronic obstructive pulmonary disease with (acute) exacerbation: Secondary | ICD-10-CM

## 2018-11-11 LAB — BASIC METABOLIC PANEL
Anion gap: 6 (ref 5–15)
BUN: 12 mg/dL (ref 8–23)
CO2: 41 mmol/L — ABNORMAL HIGH (ref 22–32)
Calcium: 8.8 mg/dL — ABNORMAL LOW (ref 8.9–10.3)
Chloride: 95 mmol/L — ABNORMAL LOW (ref 98–111)
Creatinine, Ser: 0.73 mg/dL (ref 0.61–1.24)
GFR calc Af Amer: >60 mL/min (ref >60)
GFR calc non Af Amer: >60 mL/min (ref >60)
Glucose, Bld: 159 mg/dL — ABNORMAL HIGH (ref 70–99)
Potassium: 4.2 mmol/L (ref 3.5–5.1)
Sodium: 142 mmol/L (ref 135–145)

## 2018-11-11 MED ORDER — PREDNISONE 20 MG PO TABS: 40.0000 mg | ORAL_TABLET | Freq: Every day | ORAL | 0 refills | Status: AC

## 2018-11-11 MED ORDER — AMOXICILLIN-POT CLAVULANATE 875-125 MG PO TABS: 1.0000 | ORAL_TABLET | Freq: Two times a day (BID) | ORAL | 0 refills | Status: DC

## 2018-11-11 MED ORDER — NICOTINE 14 MG/24HR TD PT24: 14.0000 mg | MEDICATED_PATCH | Freq: Every day | TRANSDERMAL | 0 refills | Status: DC

## 2018-11-11 MED ORDER — FLUTICASONE PROPIONATE 50 MCG/ACT NA SUSP: 1.0000 | Freq: Every day | NASAL | 0 refills | Status: DC

## 2018-11-11 MED FILL — FLUTICASONE PROP 50 MCG SPR: 50 | 30 days supply | Qty: 16 | Fill #0

## 2018-11-11 MED FILL — AMOX-CLAV 875-125 MG TABLET: 875-125 | 5 days supply | Qty: 10 | Fill #0

## 2018-11-11 MED FILL — predniSONE 20 MG TABS: 20 | 5 days supply | Qty: 10 | Fill #0

## 2018-11-11 NOTE — TOC Initial Note (Signed)
Transition of Care [facility]) - Initial/Assessment Note    Patient Details  Name: Jared [name] MRN: [id number] Date of Birth: 1955/10/19  [facility] CM/SW Contact:    Lanier Clam, RN Phone Number: 11/11/2018, 2:26 PM  Clinical Narrative: Patient states he has home 67 w/AHH, he came into the hospital with his home 02 tank but since in the ED he has not seen it. Nsg has checked all locations where patient has been, it is unable to locate it. Nsg has put in a safety zone. I have contacted [facility] rep [employer] to deliver home 02 travel tank to rm-he will f/u on home 02 tank. Provided Nsg with taxi voucher for patient to go home-he has no one to pick him up, & is unable to transport by bus-has home 02. Nsg has taxi voucher(confirmed address) to call once home 02 travel tank has been brought to rm. No further CM needs.                        Patient Goals and CMS Choice        Expected Discharge Plan and Services           Expected Discharge Date: 11/11/18                                    Prior Living Arrangements/Services                       Activities of Daily Living Home Assistive Devices/Equipment: None ADL Screening (condition at time of admission) Patient's cognitive ability adequate to safely complete daily activities?: Yes Is the patient deaf or have difficulty hearing?: No Does the patient have difficulty seeing, even when wearing glasses/contacts?: No Does the patient have difficulty concentrating, remembering, or making decisions?: No Patient able to express need for assistance with ADLs?: Yes Does the patient have difficulty dressing or bathing?: No Independently performs ADLs?: Yes (appropriate for developmental age) Does the patient have difficulty walking or climbing stairs?: No Weakness of Legs: None Weakness of Arms/Hands: None  Permission Sought/Granted   Permission granted to share information with : Yes, Verbal Permission  Granted              Emotional Assessment Appearance:: Appears stated age Attitude/Demeanor/Rapport: Gracious Affect (typically observed): Accepting Orientation: : Oriented to Self, Oriented to Place, Oriented to  Time, Oriented to Situation Alcohol / Substance Use: Tobacco Use Psych Involvement: No (comment)  Admission diagnosis:  Acute on chronic respiratory failure with hypercapnia (HCC) [J96.22] Patient Active Problem List   Diagnosis Date Noted  . Acute on chronic respiratory failure with hypoxia and hypercapnia (HCC) 11/10/2018  . GI bleed 10/27/2018  . Chronic anticoagulation 09/27/2018  . AV malformation of gastrointestinal tract   . Occult GI bleeding   . Heme positive stool   . Iron deficiency anemia   . Symptomatic anemia 09/04/2018  . Tobacco use disorder 09/04/2018  . Noncompliance 06/14/2017  . Elevated LFTs 08/13/2016  . Chronic respiratory failure with hypoxia (HCC) 07/24/2016  . Atrial flutter (HCC)   . Hepatic congestion 07/02/2016  . Chronic combined systolic and diastolic CHF (congestive heart failure) (HCC) 07/01/2016  . COPD with acute exacerbation (HCC) 06/03/2016  . Prediabetes 05/14/2016  . Hemoptysis 05/06/2016  . COPD GOLD III/still smoking  02/29/2016  . Cigarette smoker 01/09/2016  . CAD (coronary artery disease)  10/07/2013  . 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 Centrilobular emphysema (HCC) 10/04/2013  . Pulmonary infiltrate in right lung on CXR 10/03/2013  . Essential hypertension    PCP:  [name], MD Pharmacy:   [facility] - [place], [place] - [place] [address] [place] [address] Phone: [phone] Fax: [phone]     Social Determinants of Health (SDOH) Interventions    Readmission Risk Interventions No flowsheet data found.

## 2018-11-11 NOTE — Progress Notes (Signed)
Patient has a d/c order to go home. Patient informed RN that he had a rolling oxygen tank when arriving to the Emergency Department on admission but it is not with the patient in the room. Patient stated, "I had a rolling oxygen tank that had advanced on the side when I came into the Emergency Room. When they sent me to ICU they had an oxygen tank but it wasn't mine. I don't know where mine is [facility] checked all over 4th floor and in patient's room to see if oxygen tank was in the room. RN also called the ICU and Emergency Room and had their secretaries and charge nurses look over their floors completely to see if they could find it. They were unable to find oxygen tank and told RN that they've already looked multiple times for it yesterday as well. Patient will have to have oxygen to go home with. He wears 3L chronically at home. RN asked patient if he had all of his other belongings just to make sure nothing else was missing. Patient has his clothes and wallet and glasses with him and said, "the only thing I'm missing that I came in with is my oxygen tank." I've let our charge nurse and case management know. Will follow up with advanced home care as well.  Benson Norway, RN

## 2018-11-11 NOTE — Progress Notes (Signed)
NAME:  Jared [name], MRN:  [id number], DOB:  1955/10/17, LOS: 2 ADMISSION DATE:  11/09/2018, CONSULTATION DATE:  11/09/2018 REFERRING MD:  Dr. Caleb Popp, [facility], CHIEF COMPLAINT:  Short of breath   Brief History   63 yo male smoker presented to ER with dyspnea, weakness, confusion, hypoxia from COPD exacerbation with acute on chronic hypoxia and hypercapnia.  Followed by Dr. [name] in pulmonary office.  Was in hospital 5/18 to 5/19 with bleeding duodenal AVM.  Past Medical History  COPD with emphysema, Nonischemic CM, HTN, CVA, Cluster headaches, Diastolic CHF, CAD, PNA, duodenal AVM, A flutter on xarelto  Significant Hospital Events   5/31 Admit with SOB, weakness, confusion, hypoxia 6/01 Transferred out of SDU to med floor   Consults:    Procedures:    Significant Diagnostic Tests:  Spirometry 10/22/16 >> FEV1 1.2 (35%), FEV1% 48 Echo 05/02/18 >> EF 45 to 50%, grade 1 DD CT angio chest 05/13/18 >> coronary calcification, moderate centrilobular emphysema, micronodularity of liver UDS 5/31 >> negative  CT Maxillofacial w Contrast 5/31 >> sialadentitis of the left parotid gland with induration of the overlying subcutaneous tissues.  The parotid duct is dilated with a transition point at the distal third of the duct, just posterior to the zygomaticus major muscle & lateral to the facial vein. No definite source of obstruction. Odontogenic right maxillary sinusitis with fluid level, suspected source is the periapical lucency at the root of tooth 3  Micro Data:  COVID 5/31 >> negative  Antimicrobials:  Augmentin 6/1 >>   Interim history/subjective:  Pt reports he feels better.  States he is being discharged.  Notes he will need an O2 tank for the trip home and he will need a ride.   Objective   Blood pressure (!) 159/81, pulse 72, temperature 99 F (37.2 C), temperature source Oral, resp. rate 20, height 6\' 3"  (1.905 m), weight 90 kg, SpO2 97 %.        Intake/Output Summary (Last 24  hours) at 11/11/2018 1325 Last data filed at 11/11/2018 0600 Gross per 24 hour  Intake 360 ml  Output 350 ml  Net 10 ml   Filed Weights   11/09/18 1537 11/10/18 0700 11/11/18 0500  Weight: 87.7 kg 88.6 kg 90 kg    Examination: General: adult male lying in bed in NAD  HEENT: MM pink/moist, no jvd, no swelling Neuro: AAOx4, speech clear CV: s1s2 rrr, no m/r/g PULM: even/non-labored, lungs bilaterally diminished but clear, [place] O2 IT:GPQD, non-tender, bsx4 active  Extremities: warm/dry, no edema  Skin: no rashes or lesions  Resolved Hospital Problem list     Assessment & Plan:   Acute on chronic hypoxic, hypercapnic respiratory failure from COPD exacerbation. Severe COPD with emphysema. Tobacco abuse. P: O2 to support sats 90-95%.  Baseline O2 3L Follow intermittent CXR Smoking cessation counseling  Nicotine patch  Pulmonary hygiene- IS, mobilize Pulmonary follow up arranged for discharge > see d/c section.  6/10 @ 11:00 with Elisha Headland, NP.  Transition to prednisone 40 mg PO x5 days then stop  Discharge med plan > Breo + PRN duoneb or albuterol at home.  Flonase.  Sinusitis / Sialadentitis of Left Parotid Gland P: Augmentin, D2/7  Hx of CAD, Non ischemic CM, CVA, A flutter, HTN, HLD. P: Per primary   Hx of GI bleeding from duodenal AVM. P: Follow Hgb  BID PPI   Best practice:  Diet: Heart healthy diet DVT prophylaxis: lovenox GI prophylaxis: Protonix Mobility: bed rest Code Status: full  code Disposition: Floor  Labs   CBC: Recent Labs  Lab 11/09/18 0529 11/10/18 0327  WBC 6.6 10.2  NEUTROABS 4.2  --   HGB 9.1* 8.8*  HCT 35.3* 32.8*  MCV 102.3* 99.1  PLT 329 298    Basic Metabolic Panel: Recent Labs  Lab 11/09/18 0529 11/10/18 0327 11/11/18 0550  NA 145 141 142  K 3.6 4.2 4.2  CL 97* 96* 95*  CO2 42* 38* 41*  GLUCOSE 171* 207* 159*  BUN 15 12 12   CREATININE 0.77 0.69 0.73  CALCIUM 8.7* 8.7* 8.8*   GFR: Estimated Creatinine Clearance:  110 mL/min (by C-G formula based on SCr of 0.73 mg/dL). Recent Labs  Lab 11/09/18 0529 11/10/18 0327  WBC 6.6 10.2    Liver Function Tests: Recent Labs  Lab 11/09/18 0529  AST 17  ALT 14  ALKPHOS 58  BILITOT 0.2*  PROT 6.8  ALBUMIN 3.5   No results for input(s): LIPASE, AMYLASE in the last 168 hours. No results for input(s): AMMONIA in the last 168 hours.  ABG    Component Value Date/Time   PHART 7.302 (L) 11/09/2018 1145   PCO2ART 92.3 (HH) 11/09/2018 1145   PO2ART 57.3 (L) 11/09/2018 1145   HCO3 44.3 (H) 11/09/2018 1145   TCO2 45 (H) 09/04/2018 1646   O2SAT 90.1 11/09/2018 1145     Coagulation Profile: No results for input(s): INR, PROTIME in the last 168 hours.  Cardiac Enzymes: Recent Labs  Lab 11/09/18 0529  TROPONINI <0.03    HbA1C: Hemoglobin A1C  Date/Time Value Ref Range Status  05/14/2016 04:55 PM 6.3  Final   Hgb A1c MFr Bld  Date/Time Value Ref Range Status  10/03/2013 12:00 PM 6.2 (H) <5.7 % Final    Comment:    (NOTE)                                                                       According to the ADA Clinical Practice Recommendations for 2011, when HbA1c is used as a screening test:  >=6.5%   Diagnostic of Diabetes Mellitus           (if abnormal result is confirmed) 5.7-6.4%   Increased risk of developing Diabetes Mellitus References:Diagnosis and Classification of Diabetes Mellitus,Diabetes Care,2011,34(Suppl 1):S62-S69 and Standards of Medical Care in         Diabetes - 2011,Diabetes Care,2011,34 (Suppl 1):S11-S61.    CBG: No results for input(s): GLUCAP in the last 168 hours.   Canary Brim, NP-C [facility] Pgr: [phone] or if no answer [phone] 11/11/2018, 1:25 PM

## 2018-11-11 NOTE — Discharge Summary (Signed)
Physician Discharge Summary  Jared [name] YEM:[id number] DOB: August 22, 1955 DOA: 11/09/2018  PCP: [name], MD  Admit date: 11/09/2018 Discharge date: 11/11/2018  Admitted From: Home Disposition:  Home  Discharge Condition:Stable CODE STATUS:FULL Diet recommendation: Heart Healthy  Brief/Interim Summary: HPI: Jared [name] a 63 y.o.malewith medical history significant ofCAD, chronic combined systolic and diastolic heart failure, history of CVA, hypertension, COPD, tobacco abuse.History difficult to obtain secondary to patient being on BiPAP. Patient presented secondary to worsening shortness of breath. No associated fever, cough, chest pain, sick contacts.    ED Course: Vitals:Afebrile, pulse of 70s, respirations of 18-19, SpO2 in low 90s on 3L via nasal canula Labs:ABG7.179/above reportable range/121->7.157/above reportable range/110, CO2 42, BNP of 238, hemoglobin of 9.1, MCV of 102.3 Imaging:Chest x-ray without acute disease Medications/Course:Nasal cannula, Solu-Medrol  Hospital Course:  His hospital course remained stable.He was initially started on BiPAP and he continued to improve.  Respiratory status is currently stable and he is on 3 days of oxygen per minute which he is at home.  Patient was being followed by pulmonology. Patient seen and examined the bedside this afternoon.  Currently he is comfortable.  Denies any chest pain or shortness of breath.  Chest was clear on auscultation.  Discussed with pulmonology and we decided to discharge him today on oral steroids.  He will continue his inhalers at home.  He also has an appointment with pulmonology in 11/19/18.  Following problems were addressed during his hospitalization:  Acute on chronic respiratory failure with hypercapnia and hypoxia Acute respiratory acidosis Patient appears to be a likely chronic retainer from review of BMPs. Admitted for  COPD exacerbation. Off bipap now. Continue home inhalers.   Continue antibiotics, oral steroids CT of the face also showed right maxillary sinusitis with fluid level.Started on antibiotics,augmentin.  COPD exacerbation Patient is on Albuterol, Duoneb, Breo as an outpatient. He continues to smoke. Smoking cessation not discussed secondary to patient being on BiPAP -Has pulmonology appointment set up.  Chronic combined systolic/diastolic heart failure Last Transthoracic Echocardiogram from 04/2018 significant for an EF of 45-50% and grade 1 diastolic dysfunction. Appears euvolemic. Continue lasix at home.  Atrial flutter Rate controlled. On Coreg.  Hypertension On Coreg and losartan as an outpatient  Discharge Diagnoses:  Principal Problem:   Acute on chronic respiratory failure with hypoxia and hypercapnia (HCC) Active Problems:   Essential hypertension   COPD with acute exacerbation (HCC)   Chronic combined systolic and diastolic CHF (congestive heart failure) (HCC)   Atrial flutter (HCC)   Tobacco use disorder   Chronic anticoagulation    Discharge Instructions  Discharge Instructions    Diet - low sodium heart healthy   Complete by:  As directed    Discharge instructions   Complete by:  As directed    1)Please take prescribed medications as instructed. 2)Stop smoking. 3)Please follow-up with pulmonology as an outpatient.  You have appointment with pulmonology on 11/19/2018 at 11 AM.  Name and number the provider has been attached. 4)Follow up with your PCP in 2 weeks.   Increase activity slowly   Complete by:  As directed      Allergies as of 11/11/2018      Reactions   Lisinopril Cough      Medication List    TAKE these medications   albuterol 108 (90 Base) MCG/ACT inhaler Commonly known as:  VENTOLIN HFA Inhale 2 puffs into the lungs every 6 (six) hours as needed for wheezing or shortness of breath.   amoxicillin-clavulanate  875-125 MG tablet Commonly known as:  AUGMENTIN Take 1 tablet by mouth 2 (two) times  daily.   atorvastatin 80 MG tablet Commonly known as:  LIPITOR Take 1 tablet (80 mg total) by mouth daily.   carvedilol 3.125 MG tablet Commonly known as:  Coreg Take 1 tablet (3.125 mg total) by mouth 2 (two) times daily.   cetirizine 10 MG tablet Commonly known as:  ZYRTEC Take 1 tablet (10 mg total) by mouth daily.   ferrous sulfate 325 (65 FE) MG tablet Take 1 tablet (325 mg total) by mouth daily with breakfast.   fluticasone 50 MCG/ACT nasal spray Commonly known as:  FLONASE Place 1 spray into both nostrils daily for 30 days. Start taking on:  November 12, 2018   fluticasone furoate-vilanterol 100-25 MCG/INH Aepb Commonly known as:  Breo Ellipta Inhale 1 puff into the lungs daily.   furosemide 40 MG tablet Commonly known as:  LASIX TAKE 1 TABLET (40 MG TOTAL) BY MOUTH DAILY.   gabapentin 300 MG capsule Commonly known as:  NEURONTIN TAKE 1 CAPSULE (300 MG TOTAL) BY MOUTH AT BEDTIME.   ipratropium-albuterol 0.5-2.5 (3) MG/3ML Soln Commonly known as:  DUONEB Take 3 mLs by nebulization every 6 (six) hours as needed. What changed:  reasons to take this   losartan 50 MG tablet Commonly known as:  COZAAR Take 1 tablet (50 mg total) by mouth daily.   nicotine 14 mg/24hr patch Commonly known as:  NICODERM CQ - dosed in mg/24 hours Place 1 patch (14 mg total) onto the skin daily. Start taking on:  November 12, 2018   pantoprazole 40 MG tablet Commonly known as:  PROTONIX Take 1 tablet (40 mg total) by mouth daily at 6 (six) AM.   predniSONE 20 MG tablet Commonly known as:  DELTASONE Take 2 tablets (40 mg total) by mouth daily with breakfast for 5 days. Start taking on:  November 12, 2018   rivaroxaban 20 MG Tabs tablet Commonly known as:  XARELTO Take 20 mg by mouth daily with supper.      Follow-up Information    Coral Ceo, NP Follow up on 11/19/2018.   Specialty:  Pulmonary Disease Why:  Appt at 11:00 AM for pulmonary hospital follow up.  Contact information: [address] [place] [address] [phone]        [name], MD. Schedule an appointment as soon as possible for a visit in 2 week(s).   Specialty:  Family Medicine Contact information: [address] [place] [address] [phone]          Allergies  Allergen Reactions  . Lisinopril Cough    Consultations:  Pulmonology   Procedures/Studies: Ct Maxillofacial W Contrast  Result Date: 11/09/2018 CLINICAL DATA:  Left parotid gland enlargement. EXAM: CT MAXILLOFACIAL WITH CONTRAST TECHNIQUE: Multidetector CT imaging of the maxillofacial structures was performed with intravenous contrast. Multiplanar CT image reconstructions were also generated. CONTRAST:  75mL OMNIPAQUE IOHEXOL 300 MG/ML  SOLN COMPARISON:  None. FINDINGS: Osseous: No facial fracture. Dental: There is a periapical lucency at the root of tooth 3, at the floor of the maxillary sinus. Orbits: The globes are intact. Normal appearance of the intra- and extraconal fat. Symmetric extraocular muscles. Sinuses: Partial opacification of right maxillary sinus with fluid level. Soft tissues: The left parotid gland is enlarged and edematous with inflammatory induration of the overlying subcutaneous tissues. Stensen's duct is dilated, measuring 8 mm. No sialolith is identified. However, there is an  abrupt caliber change just lateral to the facial vein and posterior to the zygomaticus major muscle (3:41, 5:36, 6:75). There is an intermediate attenuation focus at this level, but below the expected density of a stone. Limited intracranial: Normal. IMPRESSION: 1. Sialoadenitis of the left parotid gland with induration of the overlying subcutaneous tissues. The parotid duct is dilated with a transition point at the distal third of the duct, just posterior to the zygomaticus major muscle and lateral to the facial vein. 2. No sialolithiasis or definite source of obstruction. There is an intermediate attenuation focus  at the level of the caliber change, which may simply be an adjacent vessel, but is indeterminate. 3. Odontogenic right maxillary sinusitis with fluid level. Suspected source is the periapical lucency at the root of tooth 3. Electronically Signed   By: Deatra Robinson M.D.   On: 11/09/2018 19:10   Dg Chest Port 1 View  Result Date: 11/11/2018 CLINICAL DATA:  Acute respiratory failure EXAM: PORTABLE CHEST 1 VIEW COMPARISON:  11/09/2018 FINDINGS: Cardiac shadow is mildly enlarged. Aortic calcifications are again seen. Lungs are well aerated bilaterally. No focal infiltrate or effusion is seen. Old rib fractures on the left are noted. IMPRESSION: No acute abnormality noted. Electronically Signed   By: Alcide Clever M.D.   On: 11/11/2018 08:02   Dg Chest Port 1 View  Result Date: 11/09/2018 CLINICAL DATA:  Weakness, shortness of breath EXAM: PORTABLE CHEST 1 VIEW COMPARISON:  10/27/2018 FINDINGS: Prominent right infrahilar markings, reflecting normal pulmonary vasculature. No focal consolidation. No pleural effusion or pneumothorax. The heart is top-normal in size. IMPRESSION: No evidence of acute cardiopulmonary disease. Electronically Signed   By: Charline Bills M.D.   On: 11/09/2018 05:33   Dg Chest Portable 1 View  Result Date: 10/27/2018 CLINICAL DATA:  Shortness of breath EXAM: PORTABLE CHEST 1 VIEW COMPARISON:  09/25/2018 FINDINGS: The cardiac size is increased. There is no pneumothorax. No large pleural effusion, however the costophrenic angles are not fully visualized bilaterally. Old left-sided rib fractures are noted. The lungs are somewhat hyperexpanded which can be seen in patients with COPD. Aortic calcifications are noted. IMPRESSION: 1. No acute cardiopulmonary process identified. 2. Cardiomegaly. 3. Mildly hyperexpanded lungs which can be seen in patients with COPD. Electronically Signed   By: Katherine Mantle M.D.   On: 10/27/2018 16:46       Subjective: Patient seen and examined at  bedside this morning.  Hemodynamically stable.  Comfortable.  Currently on 3 L of oxygen per minute.Stable for discharge.  Discharge Exam: Vitals:   11/11/18 0918 11/11/18 1308  BP:  (!) 159/81  Pulse:  72  Resp:    Temp:  99 F (37.2 C)  SpO2: 98% 97%   Vitals:   11/11/18 0500 11/11/18 0558 11/11/18 0918 11/11/18 1308  BP:  (!) 153/86  (!) 159/81  Pulse:  70  72  Resp:  20    Temp:  98.7 F (37.1 C)  99 F (37.2 C)  TempSrc:  Oral  Oral  SpO2:  98% 98% 97%  Weight: 90 kg     Height:        General: Pt is alert, awake, not in acute distress Cardiovascular: RRR, S1/S2 +, no rubs, no gallops Respiratory: CTA bilaterally, no wheezing, no rhonchi Abdominal: Soft, NT, ND, bowel sounds + Extremities: no edema, no cyanosis    The results of significant diagnostics from this hospitalization (including imaging, microbiology, ancillary and laboratory) are listed below for reference.  Microbiology: Recent Results (from the past 240 hour(s))  SARS Coronavirus 2 (CEPHEID - Performed in [facility] hospital lab), Hosp Order     Status: None   Collection Time: 11/09/18  5:30 AM  Result Value Ref Range Status   SARS Coronavirus 2 NEGATIVE NEGATIVE Final    Comment: (NOTE) If result is NEGATIVE SARS-CoV-2 target nucleic acids are NOT DETECTED. The SARS-CoV-2 RNA is generally detectable in upper and lower  respiratory specimens during the acute phase of infection. The lowest  concentration of SARS-CoV-2 viral copies this assay can detect is 250  copies / mL. A negative result does not preclude SARS-CoV-2 infection  and should not be used as the sole basis for treatment or other  patient management decisions.  A negative result may occur with  improper specimen collection / handling, submission of specimen other  than nasopharyngeal swab, presence of viral mutation(s) within the  areas targeted by this assay, and inadequate number of viral copies  (<250 copies / mL). A negative  result must be combined with clinical  observations, patient history, and epidemiological information. If result is POSITIVE SARS-CoV-2 target nucleic acids are DETECTED. The SARS-CoV-2 RNA is generally detectable in upper and lower  respiratory specimens dur ing the acute phase of infection.  Positive  results are indicative of active infection with SARS-CoV-2.  Clinical  correlation with patient history and other diagnostic information is  necessary to determine patient infection status.  Positive results do  not rule out bacterial infection or co-infection with other viruses. If result is PRESUMPTIVE POSTIVE SARS-CoV-2 nucleic acids MAY BE PRESENT.   A presumptive positive result was obtained on the submitted specimen  and confirmed on repeat testing.  While 2019 novel coronavirus  (SARS-CoV-2) nucleic acids may be present in the submitted sample  additional confirmatory testing may be necessary for epidemiological  and / or clinical management purposes  to differentiate between  SARS-CoV-2 and other Sarbecovirus currently known to infect humans.  If clinically indicated additional testing with an alternate test  methodology [phone]) is advised. The SARS-CoV-2 RNA is generally  detectable in upper and lower respiratory sp ecimens during the acute  phase of infection. The expected result is Negative. Fact Sheet for Patients:  BoilerBrush.com.cy Fact Sheet for Healthcare Providers: https://pope.com/ This test is not yet approved or cleared by the [place] FDA and has been authorized for detection and/or diagnosis of SARS-CoV-2 by FDA under an Emergency Use Authorization (EUA).  This EUA will remain in effect (meaning this test can be used) for the duration of the COVID-19 declaration under Section 564(b)(1) of the Act, 21 U.S.C. section 360bbb-3(b)(1), unless the authorization is terminated or revoked sooner. Performed at [facility], [address] [place], [place] [address]   MRSA PCR Screening     Status: None   Collection Time: 11/09/18  3:42 PM  Result Value Ref Range Status   MRSA by PCR NEGATIVE NEGATIVE Final    Comment:        The GeneXpert MRSA Assay (FDA approved for NASAL specimens only), is one component of a comprehensive MRSA colonization surveillance program. It is not intended to diagnose MRSA infection nor to guide or monitor treatment for MRSA infections. Performed at [facility], [address] [place], [place] [address]      Labs: BNP (last 3 results) Recent Labs    05/30/18 0350 09/04/18 1629 11/09/18 0544  BNP 187.5* 75.2 238.4*   Basic Metabolic Panel:  Recent Labs  Lab 11/09/18 0529 11/10/18 0327 11/11/18 0550  NA 145 141 142  K 3.6 4.2 4.2  CL 97* 96* 95*  CO2 42* 38* 41*  GLUCOSE 171* 207* 159*  BUN 15 12 12   CREATININE 0.77 0.69 0.73  CALCIUM 8.7* 8.7* 8.8*   Liver Function Tests: Recent Labs  Lab 11/09/18 0529  AST 17  ALT 14  ALKPHOS 58  BILITOT 0.2*  PROT 6.8  ALBUMIN 3.5   No results for input(s): LIPASE, AMYLASE in the last 168 hours. No results for input(s): AMMONIA in the last 168 hours. CBC: Recent Labs  Lab 11/09/18 0529 11/10/18 0327  WBC 6.6 10.2  NEUTROABS 4.2  --   HGB 9.1* 8.8*  HCT 35.3* 32.8*  MCV 102.3* 99.1  PLT 329 298   Cardiac Enzymes: Recent Labs  Lab 11/09/18 0529  TROPONINI <0.03   BNP: Invalid input(s): POCBNP CBG: No results for input(s): GLUCAP in the last 168 hours. D-Dimer No results for input(s): DDIMER in the last 72 hours. Hgb A1c No results for input(s): HGBA1C in the last 72 hours. Lipid Profile No results for input(s): CHOL, HDL, LDLCALC, TRIG, CHOLHDL, LDLDIRECT in the last 72 hours. Thyroid function studies No results for input(s): TSH, T4TOTAL, T3FREE, THYROIDAB in the last 72 hours.  Invalid input(s): FREET3 Anemia work up No results for  input(s): VITAMINB12, FOLATE, FERRITIN, TIBC, IRON, RETICCTPCT in the last 72 hours. Urinalysis    Component Value Date/Time   COLORURINE YELLOW 07/25/2016 1845   APPEARANCEUR HAZY (A) 07/25/2016 1845   LABSPEC 1.017 07/25/2016 1845   PHURINE 5.0 07/25/2016 1845   GLUCOSEU 50 (A) 07/25/2016 1845   HGBUR SMALL (A) 07/25/2016 1845   BILIRUBINUR NEGATIVE 07/25/2016 1845   KETONESUR NEGATIVE 07/25/2016 1845   PROTEINUR 100 (A) 07/25/2016 1845   NITRITE NEGATIVE 07/25/2016 1845   LEUKOCYTESUR NEGATIVE 07/25/2016 1845   Sepsis Labs Invalid input(s): PROCALCITONIN,  WBC,  LACTICIDVEN Microbiology Recent Results (from the past 240 hour(s))  SARS Coronavirus 2 (CEPHEID - Performed in [facility] lab), Hosp Order     Status: None   Collection Time: 11/09/18  5:30 AM  Result Value Ref Range Status   SARS Coronavirus 2 NEGATIVE NEGATIVE Final    Comment: (NOTE) If result is NEGATIVE SARS-CoV-2 target nucleic acids are NOT DETECTED. The SARS-CoV-2 RNA is generally detectable in upper and lower  respiratory specimens during the acute phase of infection. The lowest  concentration of SARS-CoV-2 viral copies this assay can detect is 250  copies / mL. A negative result does not preclude SARS-CoV-2 infection  and should not be used as the sole basis for treatment or other  patient management decisions.  A negative result may occur with  improper specimen collection / handling, submission of specimen other  than nasopharyngeal swab, presence of viral mutation(s) within the  areas targeted by this assay, and inadequate number of viral copies  (<250 copies / mL). A negative result must be combined with clinical  observations, patient history, and epidemiological information. If result is POSITIVE SARS-CoV-2 target nucleic acids are DETECTED. The SARS-CoV-2 RNA is generally detectable in upper and lower  respiratory specimens dur ing the acute phase of infection.  Positive  results are  indicative of active infection with SARS-CoV-2.  Clinical  correlation with patient history and other diagnostic information is  necessary to determine patient infection status.  Positive results do  not rule out bacterial infection or co-infection with other viruses. If result  is PRESUMPTIVE POSTIVE SARS-CoV-2 nucleic acids MAY BE PRESENT.   A presumptive positive result was obtained on the submitted specimen  and confirmed on repeat testing.  While 2019 novel coronavirus  (SARS-CoV-2) nucleic acids may be present in the submitted sample  additional confirmatory testing may be necessary for epidemiological  and / or clinical management purposes  to differentiate between  SARS-CoV-2 and other Sarbecovirus currently known to infect humans.  If clinically indicated additional testing with an alternate test  methodology [phone]) is advised. The SARS-CoV-2 RNA is generally  detectable in upper and lower respiratory sp ecimens during the acute  phase of infection. The expected result is Negative. Fact Sheet for Patients:  BoilerBrush.com.cy Fact Sheet for Healthcare Providers: https://pope.com/ This test is not yet approved or cleared by the [place] FDA and has been authorized for detection and/or diagnosis of SARS-CoV-2 by FDA under an Emergency Use Authorization (EUA).  This EUA will remain in effect (meaning this test can be used) for the duration of the COVID-19 declaration under Section 564(b)(1) of the Act, 21 U.S.C. section 360bbb-3(b)(1), unless the authorization is terminated or revoked sooner. Performed at [facility], [address], [place], [place] [address]   MRSA PCR Screening     Status: None   Collection Time: 11/09/18  3:42 PM  Result Value Ref Range Status   MRSA by PCR NEGATIVE NEGATIVE Final    Comment:        The GeneXpert MRSA Assay (FDA approved for NASAL specimens only), is one  component of a comprehensive MRSA colonization surveillance program. It is not intended to diagnose MRSA infection nor to guide or monitor treatment for MRSA infections. Performed at [facility], [address], [place], [place] [address]     Please note: You were cared for by a hospitalist during your hospital stay. Once you are discharged, your primary care physician will handle any further medical issues. Please note that NO REFILLS for any discharge medications will be authorized once you are discharged, as it is imperative that you return to your primary care physician (or establish a relationship with a primary care physician if you do not have one) for your post hospital discharge needs so that they can reassess your need for medications and monitor your lab values.    Time coordinating discharge: 40 minutes  SIGNED:   Burnadette Pop, MD  [facility] 11/11/2018, 1:47 PM Pager [phone]  If 7PM-7AM, please contact night-coverage [email or web address] Password [email or web address]

## 2018-11-12 ENCOUNTER — Telehealth: Payer: Self-pay

## 2018-11-12 NOTE — Telephone Encounter (Signed)
Transition Care Management Follow-up Telephone Call  Date of discharge and from where: 11/11/2018, [facility]   Call placed to # [phone], message left requesting a call back to this CM # [phone]

## 2018-11-13 ENCOUNTER — Telehealth: Payer: Self-pay

## 2018-11-13 NOTE — Telephone Encounter (Signed)
Transition Care Management Follow-up Telephone Call  Date of discharge and from where: 11/11/2018, [facility]   How have you been since you were released from the hospital? He stated that he is doing " all right."   Any questions or concerns? No questions/concerns reported. No bleeding reported  Items Reviewed:  Did the pt receive and understand the discharge instructions provided? Yes he has the instructions and said that he does not have any questions.   Medications obtained and verified? He said that he has all medications, including the new ones. His sister picked them up for him. He did not have any questions and did not want to review the medication list.    Any new allergies since your discharge? None reported   Do you have support at home? Lives in boarding house.  Sister provides support as needed  Other (ie: DME, Home Health, etc) no home health ordered.  He has nebulizer  Has home O2@ 3L continuously   Functional Questionnaire: (I = Independent and D = Dependent) ADL's: independent. He said that his sister will pick up medications and groceries for him. Ambulates without an assistive device.    Follow up appointments reviewed:    PCP Hospital f/u appt confirmed? 11/19/2018 @ 1410 with Dr Alvis Lemmings.  Inquired if he wanted to reschedule this appointment as he has an appointment with pulmonary earlier in the day but he said that he did not want to reschedule.  Informed him that he would be notified the day prior to the appointment if it is in person or televisit.  Specialist hospital f/u appt confirmed? Pulmonary  - 11/19/2018 @ 1100  Are transportation arrangements needed? He said that he will need to arrange transportation. Instructed him to arrange the transportation in the event that the PCP appointment is in person and he can cancel if needed. He said that he was filling out a SCAT application.  This CM instructed him to call DSS to register for medicaid  transportation.  This can be done over the phone.  He does not need to complete an application  Also informed him that he may be eligible for transportation through [facility] - he needs to call customer service to verify.   If their condition worsens, is the pt aware to call  their PCP or go to the ED? yes  Was the patient provided with contact information for the PCP's office or ED? He has the phone number for the clinic  Was the pt encouraged to call back with questions or concerns? yes

## 2018-11-18 NOTE — Progress Notes (Signed)
  ID: Jared [name], male    DOB: 1956/05/18, 63 y.o.   MRN: [id number]  Chief Complaint  Patient presents with   Hospitalization Follow-up    Hospital follow-up, COPD    Referring provider: [name], MD  HPI:  63 year old male current every day smoker followed in our office for severe COPD  PMH: Nonischemic CM, HTN, CVA, Cluster headaches, Diastolic CHF, CAD, PNA, duodenal AVM, A flutter on xarelto Smoker/ Smoking History: Current everyday smoker.84 pack year smoker. 8 cigarettes.  Maintenance: Breo Ellipta 100 Pt of: Dr. [name]  11/19/2018  - Visit   63 year old male current every day smoker followed in our office for COPD Gold stage III.  Patient was recently discharged from the hospital for COPD exacerbation resulting in acute on chronic respiratory failure.  Patient reports he has been doing well since discharge from the hospital and he is working on reducing his smoking.  He is down to 8 cigarettes daily.  Patient is maintained on Breo Ellipta 100, and uses his DuoNeb nebulized medication 1 time daily.  Patient reports that over the last week he uses rescue inhaler 3 times daily.  Patient leads a rather sedentary lifestyle.  He lives in a group home.  He reports he is not able to exercise much.  MMRC - Breathlessness Score 3 - I stop for breath after walking about 100 yards or after a few minutes on level ground (isle at grocery store is 143ft)     Tests:  COVID 5/31 >> negative  Spirometry 10/22/16 >> FEV1 1.2 (35%), FEV1% 48  Echo 05/02/18 >> EF 45 to 50%, grade 1 DD  CT angio chest 05/13/18 >> coronary calcification, moderate centrilobular emphysema, micronodularity of liver  UDS 5/31 >> negative   CT Maxillofacial w Contrast 5/31 >> sialadentitis of the left parotid gland with induration of the overlying subcutaneous tissues.  The parotid duct is dilated with a transition point at the distal third of the duct, just posterior to the zygomaticus major muscle  & lateral to the facial vein. No definite source of obstruction. Odontogenic right maxillary sinusitis with fluid level, suspected source is the periapical lucency at the root of tooth 3   FENO:  No results found for: NITRICOXIDE  PFT: No flowsheet data found.  Imaging: Ct Maxillofacial W Contrast  Result Date: 11/09/2018 CLINICAL DATA:  Left parotid gland enlargement. EXAM: CT MAXILLOFACIAL WITH CONTRAST TECHNIQUE: Multidetector CT imaging of the maxillofacial structures was performed with intravenous contrast. Multiplanar CT image reconstructions were also generated. CONTRAST:  75mL OMNIPAQUE IOHEXOL 300 MG/ML  SOLN COMPARISON:  None. FINDINGS: Osseous: No facial fracture. Dental: There is a periapical lucency at the root of tooth 3, at the floor of the maxillary sinus. Orbits: The globes are intact. Normal appearance of the intra- and extraconal fat. Symmetric extraocular muscles. Sinuses: Partial opacification of right maxillary sinus with fluid level. Soft tissues: The left parotid gland is enlarged and edematous with inflammatory induration of the overlying subcutaneous tissues. Stensen's duct is dilated, measuring 8 mm. No sialolith is identified. However, there is an abrupt caliber change just lateral to the facial vein and posterior to the zygomaticus major muscle (3:41, 5:36, 6:75). There is an intermediate attenuation focus at this level, but below the expected density of a stone. Limited intracranial: Normal. IMPRESSION: 1. Sialoadenitis of the left parotid gland with induration of the overlying subcutaneous tissues. The parotid duct is dilated with a transition point at the distal third of the duct, just posterior  to the zygomaticus major muscle and lateral to the facial vein. 2. No sialolithiasis or definite source of obstruction. There is an intermediate attenuation focus at the level of the caliber change, which may simply be an adjacent vessel, but is indeterminate. 3. Odontogenic right  maxillary sinusitis with fluid level. Suspected source is the periapical lucency at the root of tooth 3. Electronically Signed   By: Ulyses Jarred M.D.   On: 11/09/2018 19:10   Dg Chest Port 1 View  Result Date: 11/11/2018 CLINICAL DATA:  Acute respiratory failure EXAM: PORTABLE CHEST 1 VIEW COMPARISON:  11/09/2018 FINDINGS: Cardiac shadow is mildly enlarged. Aortic calcifications are again seen. Lungs are well aerated bilaterally. No focal infiltrate or effusion is seen. Old rib fractures on the left are noted. IMPRESSION: No acute abnormality noted. Electronically Signed   By: Inez Catalina M.D.   On: 11/11/2018 08:02   Dg Chest Port 1 View  Result Date: 11/09/2018 CLINICAL DATA:  Weakness, shortness of breath EXAM: PORTABLE CHEST 1 VIEW COMPARISON:  10/27/2018 FINDINGS: Prominent right infrahilar markings, reflecting normal pulmonary vasculature. No focal consolidation. No pleural effusion or pneumothorax. The heart is top-normal in size. IMPRESSION: No evidence of acute cardiopulmonary disease. Electronically Signed   By: Julian Hy M.D.   On: 11/09/2018 05:33   Dg Chest Portable 1 View  Result Date: 10/27/2018 CLINICAL DATA:  Shortness of breath EXAM: PORTABLE CHEST 1 VIEW COMPARISON:  09/25/2018 FINDINGS: The cardiac size is increased. There is no pneumothorax. No large pleural effusion, however the costophrenic angles are not fully visualized bilaterally. Old left-sided rib fractures are noted. The lungs are somewhat hyperexpanded which can be seen in patients with COPD. Aortic calcifications are noted. IMPRESSION: 1. No acute cardiopulmonary process identified. 2. Cardiomegaly. 3. Mildly hyperexpanded lungs which can be seen in patients with COPD. Electronically Signed   By: Constance Holster M.D.   On: 10/27/2018 16:46      Specialty Problems      Pulmonary Problems   Pulmonary infiltrate in right lung on CXR    New on 01/02/16 vs 08/18/15  Dx 02/20/16 ER ? Pna  > Levaquin > repeat  cxr due 1st week in Oct 2017  - FOB RLL tbbx 05/08/16  ATYPICAL PNEUMOCYTES PROLIFERATION     - cxr 05/28/2016 95% clear  - CT chest 06/26/2016 1. Diffuse bronchial wall thickening with emphysema, as above; imaging findings suggestive of underlying COPD. 2. Aortic atherosclerosis and 3 vessel coronary artery calcification 3. Small right pleural effusion 4. 7 mm subpleural nodule is stable from 02/20/2016. Future CT at 18-24 months (from 02/20/2016) is considered optional for low-risk patients, but is recommended for high-risk patients  - CT chest 12/25/2017 1. Similar left-sided pulmonary nodules, consistent with a benign etiology > no directed f/u needed          Centrilobular emphysema (HCC)   COPD GOLD III/still smoking     02/29/2016  After extensive coaching HFA effectiveness =    75% > continue symbicort  - Spirometry 05/28/2016  FEV1 1.48 (40%)  Ratio 46  p am symbicort 160 2bid  - 06/26/2016  After extensive coaching HFA effectiveness =    90% > bevespi 2 bid and change coreg to bisoprolol 5 mg daily > not done as of 07/24/2016 > at ov 10/22/2016 off coreg and all inhalers and much better sob but still some cough on entresto maint - Spirometry 10/22/2016  FEV1 1.24 (35%)  Ratio 48 - 10/22/2016  Walked RA  x 3 laps @ 185 ft each stopped due to  End of study moderate pace, no desat         Hemoptysis   COPD with acute exacerbation (HCC)   Chronic respiratory failure with hypoxia (HCC)   Acute on chronic respiratory failure with hypoxia and hypercapnia (HCC)      Allergies  Allergen Reactions   Lisinopril Cough    Immunization History  Administered Date(s) Administered   Influenza,inj,Quad PF,6+ Mos 03/20/2016, 05/02/2018   Influenza-Unspecified 03/28/2017   Pneumococcal Polysaccharide-23 01/10/2016, 05/02/2018    Past Medical History:  Diagnosis Date   CAD (coronary artery disease)    a. LHC 5/12:  LAD 20, pLCx 20, pRCA 40, dRCA 40, EF 35%, diff HK  //  b. Myoview 4/16:  Overall Impression: High risk stress nuclear study There is no evidence of ischemia. There is severe LV dysfunction. LV Ejection Fraction: 30%. LV Wall Motion: There is global LV hypokinesis.    CAP (community acquired pneumonia) 09/2013   Chronic combined systolic and diastolic CHF (congestive heart failure) (HCC)    a. Echo 4/16:Mild LVH, EF 40-45%, diffuse HK //  b. Echo 8/17: EF 35-40%, diffuse HK, diastolic dysfunction, aortic sclerosis, trivial MR, moderate LAE, normal RVSF, moderate RAE, mild TR, PASP 42 mmHg // c. Echo 4/18: Mild concentric LVH, EF 30-35, normal wall motion, grade 1 diastolic dysfunction, PASP 49   Cluster headache    "hx; haven't had one in awhile" (01/09/2016)   COPD (chronic obstructive pulmonary disease) (HCC)    [name]/notes 01/09/2016   History of CVA (cerebrovascular accident)    Hypertension    NICM (nonischemic cardiomyopathy) (HCC)    Tobacco abuse     Tobacco History: Social History   Tobacco Use  Smoking Status Current Every Day Smoker   Packs/day: 2.00   Years: 42.00   Pack years: 84.00   Types: Cigarettes  Smokeless Tobacco Never Used  Tobacco Comment   8 cigs daily   Ready to quit: Yes Counseling given: Yes Comment: 8 cigs daily   Smoking assessment and cessation counseling  Patient currently smoking: 8 cigarettes daily I have advised the patient to quit/stop smoking as soon as possible due to high risk for multiple medical problems.  It will also be very difficult for us to manage patient's  respiratory symptoms and status if we continue to expose her lungs to a known irritant.  We do not advise e-cigarettes as a form of stopping smoking.  Patient is willing to quit smoking.  Has not set a quit date  I have advised the patient that we can assist and have options of nicotine replacement therapy, provided smoking cessation education today, provided smoking cessation counseling, and provided cessation resources.  Patient is willing  to use nicotine replacement therapies, he reports 21 mg nicotine patches are in the mail  Follow-up next office visit office visit for assessment of smoking cessation.    Smoking cessation counseling advised for: 4 min  Outpatient Encounter Medications as of 11/19/2018  Medication Sig   albuterol (PROVENTIL HFA;VENTOLIN HFA) 108 (90 Base) MCG/ACT inhaler Inhale 2 puffs into the lungs every 6 (six) hours as needed for wheezing or shortness of breath.   atorvastatin (LIPITOR) 80 MG tablet Take 1 tablet (80 mg total) by mouth daily.   carvedilol (COREG) 3.125 MG tablet Take 1 tablet (3.125 mg total) by mouth 2 (two) times daily.   ferrous sulfate 325 (65 FE) MG tablet Take 1 tablet (325 mg total)  by mouth daily with breakfast.   fluticasone (FLONASE) 50 MCG/ACT nasal spray Place 1 spray into both nostrils daily for 30 days.   fluticasone furoate-vilanterol (BREO ELLIPTA) 100-25 MCG/INH AEPB Inhale 1 puff into the lungs daily.   furosemide (LASIX) 40 MG tablet TAKE 1 TABLET (40 MG TOTAL) BY MOUTH DAILY.   gabapentin (NEURONTIN) 300 MG capsule TAKE 1 CAPSULE (300 MG TOTAL) BY MOUTH AT BEDTIME.   ipratropium-albuterol (DUONEB) 0.5-2.5 (3) MG/3ML SOLN Take 3 mLs by nebulization every 6 (six) hours as needed. (Patient taking differently: Take 3 mLs by nebulization every 6 (six) hours as needed (SOB). )   losartan (COZAAR) 50 MG tablet Take 1 tablet (50 mg total) by mouth daily.   nicotine (NICODERM CQ - DOSED IN MG/24 HOURS) 14 mg/24hr patch Place 1 patch (14 mg total) onto the skin daily.   pantoprazole (PROTONIX) 40 MG tablet Take 1 tablet (40 mg total) by mouth daily at 6 (six) AM.   rivaroxaban (XARELTO) 20 MG TABS tablet Take 20 mg by mouth daily with supper.   cetirizine (ZYRTEC) 10 MG tablet Take 1 tablet (10 mg total) by mouth daily.   fluticasone furoate-vilanterol (BREO ELLIPTA) 100-25 MCG/INH AEPB Inhale 1 puff into the lungs daily.   [DISCONTINUED] amoxicillin-clavulanate  (AUGMENTIN) 875-125 MG tablet Take 1 tablet by mouth 2 (two) times daily. (Patient not taking: Reported on 11/19/2018)   No facility-administered encounter medications on file as of 11/19/2018.      Review of Systems  Review of Systems  Constitutional: Positive for fatigue. Negative for activity change, chills, fever and unexpected weight change.  HENT: Negative for postnasal drip, rhinorrhea, sinus pressure, sinus pain, sneezing and sore throat.   Eyes: Negative.   Respiratory: Positive for wheezing (with exertion ). Negative for cough and shortness of breath.   Cardiovascular: Negative for chest pain and palpitations.  Gastrointestinal: Negative for diarrhea, nausea and vomiting.  Endocrine: Negative.   Musculoskeletal: Negative.   Skin: Negative.   Neurological: Negative for dizziness and headaches.  Psychiatric/Behavioral: Negative.  Negative for dysphoric mood. The patient is not nervous/anxious.   All other systems reviewed and are negative.    Physical Exam  BP 112/60 (BP Location: Left Arm, Cuff Size: Normal)    Pulse 76    Temp 98.2 F (36.8 C) (Oral)    Ht 6\' 3"  (1.905 m)    Wt 197 lb 9.6 oz (89.6 kg)    SpO2 100%    BMI 24.70 kg/m   Wt Readings from Last 5 Encounters:  11/19/18 197 lb 9.6 oz (89.6 kg)  11/11/18 198 lb 6.6 oz (90 kg)  10/28/18 195 lb (88.5 kg)  10/07/18 197 lb (89.4 kg)  09/28/18 197 lb 8.5 oz (89.6 kg)     Physical Exam  Constitutional: He is oriented to person, place, and time and well-developed, well-nourished, and in no distress. No distress.  HENT:  Head: Normocephalic and atraumatic.  Right Ear: Hearing and external ear normal.  Left Ear: Hearing, tympanic membrane and external ear normal.  Nose: Right sinus exhibits no maxillary sinus tenderness and no frontal sinus tenderness. Left sinus exhibits no maxillary sinus tenderness and no frontal sinus tenderness.  Mouth/Throat: Uvula is midline and oropharynx is clear and moist. No  oropharyngeal exudate.  Left canal partially occluded with cerumen, difficult to visualize TM does not look infected, right canal completely occluded with cerumen, unable to visualize TM  Mallampati 1  Postnasal drip  Eyes: Pupils are equal, round, and  reactive to light.  Neck: Normal range of motion. Neck supple. No JVD present.  Cardiovascular: Normal rate, regular rhythm and normal heart sounds.  Pulmonary/Chest: Effort normal. No accessory muscle usage. No respiratory distress. He has no decreased breath sounds. He has no wheezes. He has no rhonchi.  Diminished breath sounds throughout entire exam  Abdominal: Soft. Bowel sounds are normal. There is no abdominal tenderness.  Musculoskeletal: Normal range of motion.        General: No edema.  Lymphadenopathy:    He has no cervical adenopathy.  Neurological: He is alert and oriented to person, place, and time. Gait normal.  Tolerated walk in office today  Skin: Skin is warm and dry. He is not diaphoretic. No erythema.  Psychiatric: Mood, memory, affect and judgment normal.  Nursing note and vitals reviewed.     Lab Results:  CBC    Component Value Date/Time   WBC 10.2 11/10/2018 0327   RBC 3.31 (L) 11/10/2018 0327   HGB 8.8 (L) 11/10/2018 0327   HGB 6.8 (LL) 09/24/2018 1414   HCT 32.8 (L) 11/10/2018 0327   HCT 23.5 (L) 09/24/2018 1414   PLT 298 11/10/2018 0327   PLT 381 09/24/2018 1414   MCV 99.1 11/10/2018 0327   MCV 86 09/24/2018 1414   MCH 26.6 11/10/2018 0327   MCHC 26.8 (L) 11/10/2018 0327   RDW 14.8 11/10/2018 0327   RDW 15.6 (H) 09/24/2018 1414   LYMPHSABS 1.7 11/09/2018 0529   LYMPHSABS 3.4 (H) 09/24/2018 1414   MONOABS 0.6 11/09/2018 0529   EOSABS 0.1 11/09/2018 0529   EOSABS 0.1 09/24/2018 1414   BASOSABS 0.0 11/09/2018 0529   BASOSABS 0.1 09/24/2018 1414    BMET    Component Value Date/Time   NA 142 11/11/2018 0550   NA 142 01/31/2017 1225   K 4.2 11/11/2018 0550   CL 95 (L) 11/11/2018 0550   CO2  41 (H) 11/11/2018 0550   GLUCOSE 159 (H) 11/11/2018 0550   BUN 12 11/11/2018 0550   BUN 16 01/31/2017 1225   CREATININE 0.73 11/11/2018 0550   CREATININE 1.20 07/30/2016 1431   CALCIUM 8.8 (L) 11/11/2018 0550   GFRNONAA >60 11/11/2018 0550   GFRNONAA 64 07/30/2016 1431   GFRAA >60 11/11/2018 0550   GFRAA 74 07/30/2016 1431    BNP    Component Value Date/Time   BNP 238.4 (H) 11/09/2018 0544   BNP 1,458.6 (H) 07/30/2016 1431    ProBNP    Component Value Date/Time   PROBNP 2,541 (H) 08/03/2016 1330   PROBNP 330.0 (H) 11/18/2013 0829      Assessment & Plan:   COPD GOLD III/still smoking  Assessment: Current everyday smoker smoking 8 cigarettes a day Recent discharge from the hospital for COPD exacerbation acute on chronic respiratory failure mMRC 3 today Breath sounds clear to auscultation today, breath sounds diminished throughout entire exam Patient is maintained on Breo Ellipta 100 as well as duo nebs as needed Using duo nebs 1 time daily, using rescue inhaler about 3 times weekly Tolerated walk in office  Plan: Continue Breo Ellipta inhaler, samples provided today Continue duo nebs every 6 hours as needed for shortness of breath or wheezing Continue rescue inhaler as needed Continue to work on stopping smoking, start using nicotine patches when they arrive in the mail Follow-up in 6 weeks with our office Consider pulmonary function testing once COVID-19 restrictions are lifted Can consider once COVID-19 restrictions are lifted referral to pulmonary rehab  Referral to lung  cancer screening    Cigarette smoker Assessment: Current everyday smoker, 8 cigarettes daily Actively work on stopping smoking Going to start using nicotine replacement therapy 84-pack-year smoking history  Plan: Start using 21 mg nicotine patch Continue to work on reducing your cigarette consumption, set a quit date Follow-up with our office in 6 weeks Referral to lung cancer  screening program  Chronic respiratory failure with hypoxia (HCC) Plan: Tolerated walk in office today -2 L Continue use oxygen therapy as prescribed    Return in about 6 weeks (around 12/31/2018), or if symptoms worsen or fail to improve, for Follow up with Dr. Sherene SiresWert.   Coral CeoBrian P Shi Blankenship, NP 11/19/2018   This appointment was 32 minutes long with over 50% of the time in direct face-to-face patient care, assessment, plan of care, and follow-up.

## 2018-11-19 ENCOUNTER — Ambulatory Visit: Payer: Medicare HMO | Attending: Family Medicine | Admitting: Family Medicine

## 2018-11-19 ENCOUNTER — Encounter: Payer: Self-pay | Admitting: Pulmonary Disease

## 2018-11-19 ENCOUNTER — Ambulatory Visit (INDEPENDENT_AMBULATORY_CARE_PROVIDER_SITE_OTHER): Payer: Medicare HMO | Admitting: Pulmonary Disease

## 2018-11-19 ENCOUNTER — Encounter: Payer: Self-pay | Admitting: Family Medicine

## 2018-11-19 ENCOUNTER — Other Ambulatory Visit: Payer: Self-pay

## 2018-11-19 VITALS — BP 112/60 | HR 76 | Temp 98.2°F | Ht 75.0 in | Wt 197.6 lb

## 2018-11-19 DIAGNOSIS — J9621 Acute and chronic respiratory failure with hypoxia: Secondary | ICD-10-CM | POA: Diagnosis not present

## 2018-11-19 DIAGNOSIS — J9622 Acute and chronic respiratory failure with hypercapnia: Secondary | ICD-10-CM | POA: Diagnosis not present

## 2018-11-19 DIAGNOSIS — J449 Chronic obstructive pulmonary disease, unspecified: Secondary | ICD-10-CM

## 2018-11-19 DIAGNOSIS — J9611 Chronic respiratory failure with hypoxia: Secondary | ICD-10-CM

## 2018-11-19 DIAGNOSIS — I110 Hypertensive heart disease with heart failure: Secondary | ICD-10-CM | POA: Diagnosis not present

## 2018-11-19 DIAGNOSIS — F17210 Nicotine dependence, cigarettes, uncomplicated: Secondary | ICD-10-CM

## 2018-11-19 DIAGNOSIS — I5042 Chronic combined systolic (congestive) and diastolic (congestive) heart failure: Secondary | ICD-10-CM | POA: Diagnosis not present

## 2018-11-19 DIAGNOSIS — Z7901 Long term (current) use of anticoagulants: Secondary | ICD-10-CM | POA: Diagnosis not present

## 2018-11-19 DIAGNOSIS — F17200 Nicotine dependence, unspecified, uncomplicated: Secondary | ICD-10-CM

## 2018-11-19 MED ORDER — ATORVASTATIN CALCIUM 80 MG PO TABS: 80.0000 mg | ORAL_TABLET | Freq: Every day | ORAL | 6 refills | Status: DC

## 2018-11-19 MED ORDER — FLUTICASONE FUROATE-VILANTEROL 100-25 MCG/INH IN AEPB: 1.0000 | INHALATION_SPRAY | Freq: Every day | RESPIRATORY_TRACT | 0 refills | Status: DC

## 2018-11-19 MED ORDER — ALBUTEROL SULFATE HFA 108 (90 BASE) MCG/ACT IN AERS: 2.0000 | INHALATION_SPRAY | Freq: Four times a day (QID) | RESPIRATORY_TRACT | 5 refills | Status: DC | PRN

## 2018-11-19 MED ORDER — LOSARTAN POTASSIUM 50 MG PO TABS: 50.0000 mg | ORAL_TABLET | Freq: Every day | ORAL | 6 refills | Status: DC

## 2018-11-19 MED ORDER — CARVEDILOL 3.125 MG PO TABS: 3.1250 mg | ORAL_TABLET | Freq: Two times a day (BID) | ORAL | 6 refills | Status: DC

## 2018-11-19 MED FILL — ALBUTEROL SULFATE HFA 108 (: 108 (90 BAS | 25 days supply | Qty: 18 | Fill #0

## 2018-11-19 NOTE — Patient Instructions (Addendum)
Walk in office today   Breo Ellipta 100 >>> Take 1 puff daily in the morning right when you wake up >>>Rinse your mouth out after use >>>This is a daily maintenance inhaler, NOT a rescue inhaler >>>Contact our office if you are having difficulties affording or obtaining this medication >>>It is important for you to be able to take this daily and not miss any doses   Can use DuoNeb nebulized medication every 6 hours as needed for shortness of breath or wheezing   Only use your albuterol as a rescue medication to be used if you can't catch your breath by resting or doing a relaxed purse lip breathing pattern.  - The less you use it, the better it will work when you need it. - Ok to use up to 2 puffs  every 4 hours if you must but call for immediate appointment if use goes up over your usual need - Don't leave home without it !!  (think of it like the spare tire for your car)      We recommend that you stop smoking.  >>>You need to set a quit date >>>If you have friends or family who smoke, let them know you are trying to quit and not to smoke around you or in your living environment  Smoking Cessation Resources:  1 800 QUIT NOW  >>> Patient to call this resource and utilize it to help support her quit smoking >>> Keep up your hard work with stopping smoking  You can also contact the [facility] >>>For smoking cessation classes call [phone]  We do not recommend using e-cigarettes as a form of stopping smoking  You can sign up for smoking cessation support texts and information:  >>>https://smokefree.gov/smokefreetxt   Nicotine patches: >>>Make sure you rotate sites that you do not get skin irritation, Apply 1 patch each morning to a non-hairy skin site  If you are smoking greater than 10 cigarettes/day and weigh over 45 kg start with the nicotine patch of 21 mg a day for 6 weeks, then 14 mg a day for 2 weeks, then finished with 7 mg a day for 2 weeks, then  stop  >>>If insomnia occurs you are having trouble sleeping you can take the patch off at night, and place a new one on in the morning >>>If the patch is removed at night and you have morning cravings start short acting nicotine replacement therapy such as gum or lozenges  We will refer you today to her lung cancer screening program >>>This is based off of your 84+ pack-year smoking history >>> This is a recommendation from the [place] preventative services task force (USPSTF) >>>The USPSTF recommends annual screening for lung cancer with low-dose computed tomography (LDCT) in adults aged 22 to 80 years who have a 30 pack-year smoking history and currently smoke or have quit within the past 15 years. Screening should be discontinued once a person has not smoked for 15 years or develops a health problem that substantially limits life expectancy or the ability or willingness to have curative lung surgery.   Our office will call you and set up an appointment with Kandice Robinsons (Nurse Practitioner) who leads this program.  This appointment takes place in our office.  After completing this meeting with Kandice Robinsons NP you will get a low-dose CT as the screening >>>We will call you with those results       Return in about 6 weeks (around 12/31/2018), or if symptoms worsen  or fail to improve, for Follow up with Dr. Melvyn Novas.    Coronavirus (COVID-19) Are you at risk?  Are you at risk for the Coronavirus (COVID-19)?  To be considered HIGH RISK for Coronavirus (COVID-19), you have to meet the following criteria:  . Traveled to [place], [place], [place], [place] or [place]; or in the [place] to [place], [place], [place], or [place]; and have fever, cough, and shortness of breath within the last 2 weeks of travel OR . Been in close contact with a person diagnosed with COVID-19 within the last 2 weeks and have fever, cough, and shortness of breath . IF YOU DO NOT MEET THESE CRITERIA, YOU ARE  CONSIDERED LOW RISK FOR COVID-19.  What to do if you are HIGH RISK for COVID-19?  Marland Kitchen If you are having a medical emergency, call 911. . Seek medical care right away. Before you go to a doctor's office, urgent care or emergency department, call ahead and tell them about your recent travel, contact with someone diagnosed with COVID-19, and your symptoms. You should receive instructions from your physician's office regarding next steps of care.  . When you arrive at healthcare provider, tell the healthcare staff immediately you have returned from visiting [place], [place], [place], [place] or [place]; or traveled in the [place] to [place], [place], [place], or [place]; in the last two weeks or you have been in close contact with a person diagnosed with COVID-19 in the last 2 weeks.   . Tell the health care staff about your symptoms: fever, cough and shortness of breath. . After you have been seen by a medical provider, you will be either: o Tested for (COVID-19) and discharged home on quarantine except to seek medical care if symptoms worsen, and asked to  - Stay home and avoid contact with others until you get your results (4-5 days)  - Avoid travel on public transportation if possible (such as bus, train, or airplane) or o Sent to the Emergency Department by EMS for evaluation, COVID-19 testing, and possible admission depending on your condition and test results.  What to do if you are LOW RISK for COVID-19?  Reduce your risk of any infection by using the same precautions used for avoiding the common cold or flu:  Marland Kitchen Wash your hands often with soap and warm water for at least 20 seconds.  If soap and water are not readily available, use an alcohol-based hand sanitizer with at least 60% alcohol.  . If coughing or sneezing, cover your mouth and nose by coughing or sneezing into the elbow areas of your shirt or coat, into a tissue or into your sleeve (not your hands). . Avoid shaking hands  with others and consider head nods or verbal greetings only. . Avoid touching your eyes, nose, or mouth with unwashed hands.  . Avoid close contact with people who are sick. . Avoid places or events with large numbers of people in one location, like concerts or sporting events. . Carefully consider travel plans you have or are making. . If you are planning any travel outside or inside the [place], visit the CDC's Travelers' Health webpage for the latest health notices. . If you have some symptoms but not all symptoms, continue to monitor at home and seek medical attention if your symptoms worsen. . If you are having a medical emergency, call 911.   ADDITIONAL HEALTHCARE OPTIONS FOR PATIENTS  [facility] / e-Visit: [email or web address]  [facility]: [phone]  [facility]: [phone]                   [facility]: [phone]           It is flu season:   >>> Best ways to protect herself from the flu: Receive the yearly flu vaccine, practice good hand hygiene washing with soap and also using hand sanitizer when available, eat a nutritious meals, get adequate rest, hydrate appropriately   Please contact the office if your symptoms worsen or you have concerns that you are not improving.   Thank you for choosing [facility] for your healthcare, and for allowing us to partner with you on your healthcare journey. I am thankful to be able to provide care to you today.   Elisha HeadlandBrian Mack FNP-C

## 2018-11-19 NOTE — Assessment & Plan Note (Signed)
Plan: Tolerated walk in office today -2 L Continue use oxygen therapy as prescribed

## 2018-11-19 NOTE — Assessment & Plan Note (Addendum)
Assessment: Current everyday smoker, 8 cigarettes daily Actively work on stopping smoking Going to start using nicotine replacement therapy 84-pack-year smoking history  Plan: Start using 21 mg nicotine patch Continue to work on reducing your cigarette consumption, set a quit date Follow-up with our office in 6 weeks Referral to lung cancer screening program

## 2018-11-19 NOTE — Progress Notes (Signed)
Virtual Visit via Telephone Note  I connected with Jared [name], on 11/19/2018 at 2:17 PM by telephone due to the COVID-19 pandemic and verified that I am speaking with the correct person using two identifiers.   Consent: I discussed the limitations, risks, security and privacy concerns of performing an evaluation and management service by telephone and the availability of in person appointments. I also discussed with the patient that there may be a patient responsible charge related to this service. The patient expressed understanding and agreed to proceed.   Location of Patient: Patient's home  Location of Provider: Clinic   Persons participating in Telemedicine visit: Russel Veda Canning Farrington-CMA Dr. Felecia Shelling     History of Present Illness: Jared [name] is a 63 year old male with a history of hypertension, COPD, tobacco abuse, NICM , CHF (EF 45-50% from echo 04/2018  improved from  30-35% in 09/2016), atrial flutter, chronic respiratory failure with hypoxia (currently on 3 L of oxygen) seen for a follow-up visit. He had a hospitalization at [facility] from 11/09/2018 through 11/11/2022 acute on chronic respiratory failure with hypoxia and hypercapnia secondary to COPD exacerbation  which required treatment with BiPAP Chest x-ray revealed no evidence of acute cardiopulmonary disease, CT maxillofacial revealed right maxillary sinusitis with fluid level for which he was placed on Augmentin he was closely followed by pulmonary and treated with nebulizers and steroids.  He had a follow-up visit with the pulmonary nurse practitioner today and no regimen changes were made.  He informs me he is doing well today and has been on between 2 and 3 L of oxygen at rest. He continues to smoke about 8 cigarettes/day but he has a shipment of nicotine patches in the mail and will commence them once he receives them.  He denies chest pain, pedal edema, worsening shortness of breath, weight  gain and has not had a recent visit with his cardiologist-Dr. Harrington Challenger but promises to make an appointment.  He has no additional concerns today.  Past Medical History:  Diagnosis Date  . CAD (coronary artery disease)    a. LHC 5/12:  LAD 20, pLCx 20, pRCA 40, dRCA 40, EF 35%, diff HK  //  b. Myoview 4/16: Overall Impression: High risk stress nuclear study There is no evidence of ischemia. There is severe LV dysfunction. LV Ejection Fraction: 30%. LV Wall Motion: There is global LV hypokinesis.   Marland Kitchen CAP (community acquired pneumonia) 09/2013  . Chronic combined systolic and diastolic CHF (congestive heart failure) (HCC)    a. Echo 4/16:Mild LVH, EF 40-45%, diffuse HK //  b. Echo 8/17: EF 35-40%, diffuse HK, diastolic dysfunction, aortic sclerosis, trivial MR, moderate LAE, normal RVSF, moderate RAE, mild TR, PASP 42 mmHg // c. Echo 4/18: Mild concentric LVH, EF 30-35, normal wall motion, grade 1 diastolic dysfunction, PASP 49  . Cluster headache    "hx; haven't had one in awhile" (01/09/2016)  . COPD (chronic obstructive pulmonary disease) ([place])    [name] 01/09/2016  . History of CVA (cerebrovascular accident)   . Hypertension   . NICM (nonischemic cardiomyopathy) ([place])   . Tobacco abuse    Allergies  Allergen Reactions  . Lisinopril Cough    Current Outpatient Medications on File Prior to Visit  Medication Sig Dispense Refill  . albuterol (PROVENTIL HFA;VENTOLIN HFA) 108 (90 Base) MCG/ACT inhaler Inhale 2 puffs into the lungs every 6 (six) hours as needed for wheezing or shortness of breath. 1 Inhaler 5  . atorvastatin (LIPITOR) 80 MG  tablet Take 1 tablet (80 mg total) by mouth daily. 30 tablet 0  . carvedilol (COREG) 3.125 MG tablet Take 1 tablet (3.125 mg total) by mouth 2 (two) times daily. 60 tablet 6  . cetirizine (ZYRTEC) 10 MG tablet Take 1 tablet (10 mg total) by mouth daily. 30 tablet 1  . ferrous sulfate 325 (65 FE) MG tablet Take 1 tablet (325 mg total) by mouth daily with  breakfast. 60 tablet 1  . fluticasone (FLONASE) 50 MCG/ACT nasal spray Place 1 spray into both nostrils daily for 30 days. 0.002 g 0  . fluticasone furoate-vilanterol (BREO ELLIPTA) 100-25 MCG/INH AEPB Inhale 1 puff into the lungs daily. 60 each 6  . furosemide (LASIX) 40 MG tablet TAKE 1 TABLET (40 MG TOTAL) BY MOUTH DAILY. 30 tablet 2  . gabapentin (NEURONTIN) 300 MG capsule TAKE 1 CAPSULE (300 MG TOTAL) BY MOUTH AT BEDTIME. 30 capsule 3  . ipratropium-albuterol (DUONEB) 0.5-2.5 (3) MG/3ML SOLN Take 3 mLs by nebulization every 6 (six) hours as needed. (Patient taking differently: Take 3 mLs by nebulization every 6 (six) hours as needed (SOB). ) 120 mL 3  . losartan (COZAAR) 50 MG tablet Take 1 tablet (50 mg total) by mouth daily. 30 tablet 6  . pantoprazole (PROTONIX) 40 MG tablet Take 1 tablet (40 mg total) by mouth daily at 6 (six) AM. 30 tablet 0  . rivaroxaban (XARELTO) 20 MG TABS tablet Take 20 mg by mouth daily with supper.    . nicotine (NICODERM CQ - DOSED IN MG/24 HOURS) 14 mg/24hr patch Place 1 patch (14 mg total) onto the skin daily. (Patient not taking: Reported on 11/19/2018) 28 patch 0   No current facility-administered medications on file prior to visit.     Observations/Objective: Awake, alert, oriented x3 Not in acute distress   CMP Latest Ref Rng & Units 11/11/2018 11/10/2018 11/09/2018  Glucose 70 - 99 mg/dL 413(K159(H) 440(N207(H) 027(O171(H)  BUN 8 - 23 mg/dL 12 12 15   Creatinine 0.61 - 1.24 mg/dL 5.360.73 6.440.69 0.340.77  Sodium 135 - 145 mmol/L 142 141 145  Potassium 3.5 - 5.1 mmol/L 4.2 4.2 3.6  Chloride 98 - 111 mmol/L 95(L) 96(L) 97(L)  CO2 22 - 32 mmol/L 41(H) 38(H) 42(H)  Calcium 8.9 - 10.3 mg/dL 7.4(Q8.8(L) 5.9(D8.7(L) 6.3(O8.7(L)  Total Protein 6.5 - 8.1 g/dL - - 6.8  Total Bilirubin 0.3 - 1.2 mg/dL - - 7.5(I0.2(L)  Alkaline Phos 38 - 126 U/L - - 58  AST 15 - 41 U/L - - 17  ALT 0 - 44 U/L - - 14    Lipid Panel     Component Value Date/Time   CHOL 206 (H) 08/26/2017 0756   TRIG 168 (H) 08/26/2017  0756   HDL 53 08/26/2017 0756   CHOLHDL 3.9 08/26/2017 0756   CHOLHDL 2.8 01/11/2016 0446   VLDL 14 01/11/2016 0446   LDLCALC 119 (H) 08/26/2017 0756    Assessment and Plan: 1. 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  Dispense: 30 tablet; Refill: 6 - carvedilol (COREG) 3.125 MG tablet; Take 1 tablet (3.125 mg total) by mouth 2 (two) times daily.  Dispense: 60 tablet; Refill: 6  2. COPD GOLD III/still smoking  Stable Smoking cessation will be beneficial Continue Breo Followed closely by pulmonary -  albuterol (VENTOLIN HFA) 108 (90 Base) MCG/ACT inhaler; Inhale 2 puffs into the lungs every 6 (six) hours as needed for wheezing or shortness of breath.  Dispense: 1 Inhaler; Refill: 5  3. Chronic anticoagulation Continue Xarelto  4. Tobacco use disorder Spent 3 minutes counseling on cessation and he is planning on commencing nicotine replacement therapy by means of nicotine patches  5. Acute on chronic respiratory failure with hypoxia and hypercapnia (HCC) Acute exacerbation resolved Continue 3 L of oxygen   Follow Up Instructions: 3 months   I discussed the assessment and treatment plan with the patient. The patient was provided an opportunity to ask questions and all were answered. The patient agreed with the plan and demonstrated an understanding of the instructions.   The patient was advised to call back or seek an in-person evaluation if the symptoms worsen or if the condition fails to improve as anticipated.     I provided 18 minutes total of non-face-to-face time during this encounter including median intraservice time, reviewing previous notes, labs, imaging, medications, management and patient verbalized understanding.     [name], MD, FAAFP. [facility] [place], [place] [phone]   11/19/2018, 2:17 PM

## 2018-11-19 NOTE — Assessment & Plan Note (Addendum)
Assessment: Current everyday smoker smoking 8 cigarettes a day Recent discharge from the hospital for COPD exacerbation acute on chronic respiratory failure mMRC 3 today Breath sounds clear to auscultation today, breath sounds diminished throughout entire exam Patient is maintained on Breo Ellipta 100 as well as duo nebs as needed Using duo nebs 1 time daily, using rescue inhaler about 3 times weekly Tolerated walk in office  Plan: Continue Breo Ellipta inhaler, samples provided today Continue duo nebs every 6 hours as needed for shortness of breath or wheezing Continue rescue inhaler as needed Continue to work on stopping smoking, start using nicotine patches when they arrive in the mail Follow-up in 6 weeks with our office Consider pulmonary function testing once COVID-19 restrictions are lifted Can consider once COVID-19 restrictions are lifted referral to pulmonary rehab  Referral to lung cancer screening

## 2018-11-19 NOTE — Progress Notes (Signed)
Patient has been called and DOB has been verified. Patient has been screened and transferred to PCP to start phone visit.     

## 2018-11-20 ENCOUNTER — Encounter: Payer: Self-pay | Admitting: Family Medicine

## 2018-11-20 NOTE — Progress Notes (Signed)
Chart and office note reviewed in detail  > agree with a/p as outlined    

## 2018-11-21 ENCOUNTER — Ambulatory Visit: Payer: Self-pay | Admitting: Internal Medicine

## 2018-12-02 ENCOUNTER — Other Ambulatory Visit: Payer: Self-pay | Admitting: Acute Care

## 2018-12-02 DIAGNOSIS — F17210 Nicotine dependence, cigarettes, uncomplicated: Secondary | ICD-10-CM

## 2018-12-02 DIAGNOSIS — Z87891 Personal history of nicotine dependence: Secondary | ICD-10-CM

## 2018-12-02 DIAGNOSIS — Z122 Encounter for screening for malignant neoplasm of respiratory organs: Secondary | ICD-10-CM

## 2018-12-05 ENCOUNTER — Other Ambulatory Visit: Payer: Self-pay | Admitting: Gastroenterology

## 2018-12-05 ENCOUNTER — Other Ambulatory Visit: Payer: Self-pay | Admitting: Family Medicine

## 2018-12-05 ENCOUNTER — Telehealth: Payer: Self-pay | Admitting: Family Medicine

## 2018-12-05 DIAGNOSIS — I110 Hypertensive heart disease with heart failure: Secondary | ICD-10-CM

## 2018-12-05 MED ORDER — IPRATROPIUM-ALBUTEROL 0.5-2.5 (3) MG/3ML IN SOLN: 3.0000 mL | Freq: Four times a day (QID) | RESPIRATORY_TRACT | 3 refills | Status: DC | PRN

## 2018-12-05 MED ORDER — MISC. DEVICES MISC: 0 refills | Status: DC

## 2018-12-05 MED FILL — XARELTO 20 MG TABLET: 20 | 30 days supply | Qty: 30 | Fill #5

## 2018-12-05 MED FILL — CARVEDILOL 3.125 MG TABLET: 3.125 | 30 days supply | Qty: 60 | Fill #5

## 2018-12-05 MED FILL — ATORVASTATIN 80 MG TABLET: 80 | 30 days supply | Qty: 30 | Fill #5

## 2018-12-05 MED FILL — PANTOPRAZOLE SOD DR 40 MG T: 40 | 30 days supply | Qty: 30 | Fill #0

## 2018-12-05 MED FILL — LOSARTAN POTASSIUM 50 MG TA: 50 | 30 days supply | Qty: 30 | Fill #5

## 2018-12-05 MED FILL — GABAPENTIN 300 MG CAPSULE: 300 | 30 days supply | Qty: 30 | Fill #1

## 2018-12-05 NOTE — Telephone Encounter (Signed)
Paperwork will be faxed over to [facility].

## 2018-12-05 NOTE — Telephone Encounter (Signed)
Will route to PCP for review. 

## 2018-12-05 NOTE — Telephone Encounter (Signed)
Rx has been done. Please fax to [place].

## 2018-12-05 NOTE — Telephone Encounter (Signed)
Patient called requesting a Portable Oxygen Concentrator.  [facility] states that [facility] carries them. But needed an RX for it.  Please advise [phone]

## 2018-12-08 MED FILL — IPRAT-ALBUT 0.5-3(2.5) MG/3: 0.5-2.5 (3) | 14 days supply | Qty: 180 | Fill #0

## 2018-12-31 ENCOUNTER — Ambulatory Visit (INDEPENDENT_AMBULATORY_CARE_PROVIDER_SITE_OTHER): Payer: Medicare HMO | Admitting: Internal Medicine

## 2018-12-31 ENCOUNTER — Other Ambulatory Visit: Payer: Self-pay

## 2018-12-31 ENCOUNTER — Encounter: Payer: Self-pay | Admitting: Internal Medicine

## 2018-12-31 DIAGNOSIS — J449 Chronic obstructive pulmonary disease, unspecified: Secondary | ICD-10-CM | POA: Diagnosis not present

## 2018-12-31 DIAGNOSIS — J9611 Chronic respiratory failure with hypoxia: Secondary | ICD-10-CM | POA: Diagnosis not present

## 2018-12-31 DIAGNOSIS — F17210 Nicotine dependence, cigarettes, uncomplicated: Secondary | ICD-10-CM

## 2018-12-31 DIAGNOSIS — J9612 Chronic respiratory failure with hypercapnia: Secondary | ICD-10-CM

## 2018-12-31 MED ORDER — ALBUTEROL SULFATE (2.5 MG/3ML) 0.083% IN NEBU: 2.5000 mg | INHALATION_SOLUTION | RESPIRATORY_TRACT | 12 refills | Status: DC | PRN

## 2018-12-31 MED ORDER — TUDORZA PRESSAIR 400 MCG/ACT IN AEPB: 1.0000 | INHALATION_SPRAY | Freq: Two times a day (BID) | RESPIRATORY_TRACT | 0 refills | Status: DC

## 2018-12-31 MED ORDER — BREO ELLIPTA 100-25 MCG/INH IN AEPB: 1.0000 | INHALATION_SPRAY | Freq: Every day | RESPIRATORY_TRACT | 0 refills | Status: DC

## 2018-12-31 MED ORDER — TUDORZA PRESSAIR 400 MCG/ACT IN AEPB: 1.0000 | INHALATION_SPRAY | Freq: Two times a day (BID) | RESPIRATORY_TRACT | 11 refills | Status: DC

## 2018-12-31 MED FILL — ALBUTEROL SUL 2.5 MG/3 ML S: (2.5 MG/3ML | 4 days supply | Qty: 75 | Fill #0

## 2018-12-31 NOTE — Progress Notes (Signed)
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 He also reports dyspnea on exertion. In addition, he reports epigastric abdominal pain, which may radiate to the lower portion of his sternum versus chest pain . He denies any lower extremity edema. Admits to salt rich food ingestion .The symptoms are similar to those of one week ago, at which time, workup was suspicious for acute exacerbation of systolic heart failure. She was started on Cozaar, metoprolol and Lasix 20 mg daily at the time, and was recommended to see cardiology, which he failed to do so. His cardiologist is Dr. Dorris Carnes, last seen in December 2016.the patient continues to smoke 1 pack a day of cigarettes  Discharge Diagnoses:  Acute respiratory failure secondary to CHF exacerbation -O2 sats upon admission 89% -CXR unremarkable for infection -Responded well to IVlasix-->po lasix -Weaned off of O2-SpO2 95% on room air  Acute combined systolic/diastolic heart failure -BNP 2056.8 -Echocardiogram 09/20/2014 showed EF 40-45% -Transition to by mouth Lasix 20 mg daily -Echocardiogram 01/10/2016: EF 56-21%, diastolic dysfunction --AST 147, ALT 110 upon admission -Currently trending downward -hepatitis panel unremarkable -Patient denies drug/alcohol use  -switch metoprolol-->coreg -continue losartan  Essential Hypertension -Has not been taking home medications for over a year due to lack of insurance and monetary funds -Restarted on losartan, Lasix --switch metoprolol-->coreg 6.25 mg bid  CAD -Currently no chest pain -Continue coreg, losartan, ASA 81 mg daily -LDL 80  Tobacco Abuse -Smoking cessation discussed -Continue nicotine patch  Moderate malnutrition -Nutrition consult   History of Present Illness  02/29/2016 1st [facility] office visit/ Chevon Fomby  symb 160 2bid/ bid saba (not prn)  Chief Complaint  Patient presents with  . Pulmonary Consult    Pt referred by [name], PA, for cough, hemoptysis, and emphysema. Pt c/o hemoptysis x 3 weeks, pt states this occurs daily but has been improving. Pt c/o increase in SOB with activity and rest.   acute  onset epistaxis/ hemoptyis end of Auguest > ER eval 02/20/16 dx RLL pna by CTa chest > rx levaquin/ prednisone and minimal hemoptysis now  Doe = MMRC2 = can't walk a nl pace on a flat grade s sob but does fine slow and flat eg [employer] shopping rec Plan A = Automatic =  Symbicort 160 Take 2 puffs first thing in am and then another 2 puffs about 12 hours later.  Work on inhaler technique: Plan B = Backup Only use your albuterol as a rescue medication\ Plan C = Crisis - only use your albuterol nebulizer if you first try Plan B and it fails to help > ok to use the nebulizer up to every 4 hours but if start needing it regularly call for immediate appointment  The key is to stop smoking completely before smoking completely stops you!  Please schedule a follow up office visit in  2 weeks, sooner if needed with cxr on return > did not return   Admit date: 05/04/2016 Discharge date: 05/09/2016    DISCHARGE DIAGNOSES:  Principal Problem:   Hemoptysis Active Problems:   Community acquired pneumonia of right lower lobe of lung (HCC) - - FOB RLL tbbx ATYPICAL PNEUMOCYTES  PROLIFERATION/ LLL BAL 05/08/16    Hypertension   COPD with acute exacerbation (HCC)   Nonischemic dilated cardiomyopathy (HCC)   Cigarette smoker   Chronic systolic CHF (congestive heart failure) (HCC)   06/03/16 to ER> only better while on neb    06/26/2016  f/u ov/Jennavecia Schwier re:  GOLD III copd / maint symb 160 / saba  Chief Complaint  Patient presents with  . Follow-up    pt had CT today. pt states breathing has worsen since last OV. pt c/o increased sob with exertion, prod cough with white mucus, wheezing & chest tightness,  breathing was worse w/in 2 days of last ov / then ran out of lasix one week ago and swelling started to get worse Also worse orthopnea ? Related to leg swelling in terms of timing but pt not sure  Doe now = MMRC3 = can't walk 100 yards even at a slow pace at a flat grade s stopping due to sob  rec Bisoprolol 5 mg one daily in place of the twice daily corevidol and resume your lasix and potassium  Plan A = Automatic = stop symbicort and start bevespi Take 2 puffs first thing in am and then another 2 puffs about 12 hours later.  Plan B = Backup Only use your albuterol as a rescue medication The key is to stop smoking completely before smoking completely stops you!  Please schedule a follow up office visit in 4 weeks, sooner if needed bring all meds      12/31/2018  f/u ov/Alane Hanssen re: re establish re copd now BREO ? Some bladder problems on LAMA?  Chief Complaint  Patient presents with  . Follow-up    Breathing seems slightly worse since the last visit. He is using his ventolin inhaler once per day and nebs about 2 x per day.   Dyspnea:  Worse for at least a year no better with 02/ some discomfort off 02 " like tightness "  Currently MMRC3 = can't walk 100 yards even at a slow pace at a flat grade s stopping due to sob even on 02 up to 4lpm  Cough: none  Sleeping: flat ok / no am cough /  SABA use: as above  Better p nebs/ over using  02: 3lpm 24/7    No obvious  day to day or daytime variability or assoc excess/ purulent sputum or mucus plugs or hemoptysis or  subjective wheeze or overt sinus or hb symptoms.   Sleeping ok as above  without nocturnal  or early am exacerbation  of respiratory  c/o's or need for noct saba. Also denies any obvious fluctuation of symptoms with weather or environmental changes or other aggravating or alleviating factors except as outlined above   No unusual exposure hx or h/o childhood pna/ asthma or knowledge of premature birth.  Current Allergies, Complete Past Medical History, Past Surgical History, Family History, and Social History were reviewed in [facility] electronic medical record.  ROS  The following are not active complaints unless bolded Hoarseness, sore throat, dysphagia, dental problems, itching, sneezing,  nasal congestion or discharge of excess mucus or purulent secretions, ear ache,   fever, chills, sweats,  unintended wt loss or wt gain, classically pleuritic or exertional cp,  orthopnea pnd or arm/hand swelling  or leg swelling, presyncope, palpitations, abdominal pain, anorexia, nausea, vomiting, diarrhea  or change in bowel habits or change in bladder habits, change in stools or change in urine, dysuria, hematuria,  rash, arthralgias, visual complaints, headache, numbness, weakness or ataxia or problems with walking or coordination,  change in mood or  memory.        Current Meds  Medication Sig  . albuterol (VENTOLIN HFA) 108 (90 Base) MCG/ACT inhaler Inhale 2 puffs into the lungs every 6 (six) hours as needed for wheezing or shortness of breath.  Marland Kitchen atorvastatin (LIPITOR) 80 MG tablet Take 1 tablet (80 mg total) by mouth daily.  . carvedilol (COREG) 3.125 MG tablet Take 1 tablet (3.125 mg total) by mouth 2 (two) times daily.  . cetirizine (ZYRTEC) 10 MG tablet Take 1 tablet (10 mg total) by mouth daily.  . ferrous sulfate 325 (65 FE) MG tablet Take 1 tablet (325 mg total) by mouth daily with breakfast.   . fluticasone furoate-vilanterol (BREO ELLIPTA) 100-25 MCG/INH AEPB Inhale 1 puff into the lungs daily.  . furosemide (LASIX) 40 MG tablet TAKE 1 TABLET (40 MG TOTAL) BY MOUTH DAILY.  Marland Kitchen gabapentin (NEURONTIN) 300 MG capsule TAKE 1 CAPSULE (300 MG TOTAL) BY MOUTH AT BEDTIME.  Marland Kitchen ipratropium-albuterol (DUONEB) 0.5-2.5 (3) MG/3ML SOLN Take 3 mLs by nebulization every 6 (six) hours as needed (SOB).  Marland Kitchen losartan (COZAAR) 50 MG tablet Take 1 tablet (50 mg total) by mouth daily.  . Misc. Devices MISC Portable oxygen concentrator. Diagnosis COPD.  . nicotine (NICODERM CQ - DOSED IN MG/24 HOURS) 14 mg/24hr patch Place 1 patch (14 mg total) onto the skin daily.  . pantoprazole (PROTONIX) 40 MG tablet TAKE 1 TABLET (40 MG TOTAL) BY MOUTH DAILY AT 6 (SIX) AM.  . rivaroxaban (XARELTO) 20 MG TABS tablet Take 20 mg by mouth daily with supper.                      Objective:   Physical Exam  Anxious bm nad at rest   12/31/2018      189  10/22/2016      190  07/24/2016        209  06/26/2016        213  05/28/2016      194   02/29/16 177 lb 12.8 oz (80.6 kg)  02/24/16 182 lb 12.8 oz (82.9 kg)  02/20/16 175 lb (79.4 kg)    Vital signs reviewed - Note on arrival 02 sats  100% on 3lpm      HEENT: Poor dentition, nl oropharynx. Nl external ear canals without cough reflex -  Mild bilateral non-specific turbinate edema     NECK :  without JVD/Nodes/TM/ nl carotid upstrokes bilaterally   LUNGS: no acc muscle use,  Mod barrel  contour chest wall with bilateral  Distant  Late exp wheeze but  without cough on insp or exp maneuver and mod  Hyperresonant  to  percussion bilaterally     CV:  RRR  no s3 or murmur or increase in P2, and no edema   ABD:  soft and nontender with pos mid insp Hoover's  in the supine position. No bruits or organomegaly appreciated, bowel sounds nl  MS:     ext warm without deformities, calf tenderness, cyanosis or clubbing No obvious joint restrictions   SKIN: warm and  dry without lesions    NEURO:  alert, approp, nl sensorium with  no motor or cerebellar deficits apparent.          For HRCT chest 01/07/19    I personally reviewed images and agree with radiology impression as follows:  pCXR 11/11/18  No acute abnormality noted.  Sinus CT 11/09/18 Odontogenic right maxillary sinusitis with fluid level  Labs  reviewed:      Chemistry      Component Value Date/Time   NA 142 11/11/2018 0550   NA 142 01/31/2017 1225   K 4.2 11/11/2018 0550   CL 95 (L) 11/11/2018 0550   CO2 41 (H) 11/11/2018 0550   BUN 12 11/11/2018 0550   BUN 16 01/31/2017 1225   CREATININE 0.73 11/11/2018 0550   CREATININE 1.20 07/30/2016 1431      Component Value Date/Time   CALCIUM 8.8 (L) 11/11/2018 0550   ALKPHOS 58 11/09/2018 0529   AST 17 11/09/2018 0529   ALT 14 11/09/2018 0529   BILITOT 0.2 (L) 11/09/2018 0529        Lab Results  Component Value Date   WBC 10.2 11/10/2018   HGB 8.8 (L) 11/10/2018   HCT 32.8 (L) 11/10/2018   MCV 99.1 11/10/2018   PLT 298 11/10/2018           Assessment & Plan:

## 2018-12-31 NOTE — Patient Instructions (Addendum)
We need you need you to monitor your 02 level to be sure it's above 90%  Be sure protonix 40 mg Take 30-60 min before first meal of the day   We will ask [facility] to do BEST FIT analysis for longterm 02  The key is to stop smoking completely before smoking completely stops you!  Add tudorza one puff twice daily   Only use your albuterol as a rescue medication to be used if you can't catch your breath by resting or doing a relaxed purse lip breathing pattern.  - The less you use it, the better it will work when you need it. - Ok to use up to 2 puffs  every 4 hours if you must but call for immediate appointment if use goes up over your usual need - Don't leave home without it !!  (think of it like the spare tire for your car)   Only use nebulizer if you try the inhaler first  = change duoneb to pure albuterol 2.5 mg up to  every 4 hours is the max   Prednisone 10 mg take  4 each am x 2 days,   2 each am x 2 days,  1 each am x 2 days and stop   Please schedule a follow up office visit in 4 weeks, sooner if needed  with all medications /inhalers/ solutions in hand so we can verify exactly what you are taking. This includes all medications from all doctors and over the counters Add: f/u sinus dz by CT

## 2019-01-01 ENCOUNTER — Encounter: Payer: Self-pay | Admitting: Internal Medicine

## 2019-01-01 DIAGNOSIS — J9611 Chronic respiratory failure with hypoxia: Secondary | ICD-10-CM | POA: Insufficient documentation

## 2019-01-01 NOTE — Assessment & Plan Note (Addendum)
Active smoker 02/29/2016  After extensive coaching HFA effectiveness =    75% > continue symbicort  - Spirometry 05/28/2016  FEV1 1.48 (40%)  Ratio 46  p am symbicort 160 2bid  - 06/26/2016  After extensive coaching HFA effectiveness =    90% > bevespi 2 bid and change coreg to bisoprolol 5 mg daily > not done as of 07/24/2016 > at ov 10/22/2016 off coreg and all inhalers and much better sob but still some cough on entresto maint - Spirometry 10/22/2016  FEV1 1.24 (35%)  Ratio 48 - 12/31/2018  After extensive coaching inhaler device,  effectiveness =    90% with tudorza rx one puff bid    DDX of  difficult airways management almost all start with A and  include Adherence, Ace Inhibitors, Acid Reflux, Active Sinus Disease, Alpha 1 Antitripsin deficiency, Anxiety masquerading as Airways dz,  ABPA,  Allergy(esp in young), Aspiration (esp in elderly), Adverse effects of meds,  Active smoking or vaping, A bunch of PE's (a small clot burden can't cause this syndrome unless there is already severe underlying pulm or vascular dz with poor reserve) plus two Bs  = Bronchiectasis and Beta blocker use..and one C= CHF   Adherence is always the initial "prime suspect" and is a multilayered concern that requires a "trust but verify" approach in every patient - starting with knowing how to use medications, especially inhalers, correctly, keeping up with refills and understanding the fundamental difference between maintenance and prns vs those medications only taken for a very short course and then stopped and not refilled.  - see device teaching - return with all meds in hand using a trust but verify approach to confirm accurate Medication  Reconciliation The principal here is that until we are certain that the  patients are doing what we've asked, it makes no sense to ask them to do more.    Active smoking also at top of usual list of suspects (see separate a/p)   ? Acid (or non-acid) GERD > always difficult to exclude  as up to 75% of pts in some series report no assoc GI/ Heartburn symptoms> rec continue max (24h)  acid suppression and diet restrictions/ reviewed     ? Anxiety > usually at the bottom of this list of usual suspects but should be much higher on this pt's based on H and P and   may interfere with adherence and also interpretation of response or lack thereof to symptom management which can be quite subjective.   ? Active sinus dz > note last sinus CT findings in H/P > ent eval prn   ? Allergy > Prednisone 10 mg take  4 each am x 2 days,   2 each am x 2 days,  1 each am x 2 days and stop continue ICS   ? BB effects > unlikely on such loiw does of coreg   ? chf > does have reduced EF, need to keep in ddx for flares   I had an extended discussion with the patient reviewing all relevant studies completed to date and  lasting 25 minutes of a 40  minute office visit to re-establish with me p 2 y absence     re  severe non-specific but potentially very serious refractory respiratory symptoms of uncertain and potentially multiple  etiologies.  I directly observed portions of ambulatory 02 saturation study/performed device teaching which extended face to face time for this visit    Each maintenance medication  was reviewed in detail including most importantly the difference between maintenance and prns and under what circumstances the prns are to be triggered using an action plan format that is not reflected in the computer generated alphabetically organized AVS.    Please see AVS for specific instructions unique to this office visit that I personally wrote and verbalized to the the pt in detail and then reviewed with pt  by my nurse highlighting any changes in therapy/plan of care  recommended at today's visit.

## 2019-01-01 NOTE — Assessment & Plan Note (Signed)
Counseled re importance of smoking cessation but did not meet time criteria for separate billing   °

## 2019-01-01 NOTE — Assessment & Plan Note (Signed)
See copd - 10/22/2016  Walked RA x 3 laps @ 185 ft each stopped due to  End of study moderate pace, no desat  - HCO03  11/11/18  = 41  - 12/31/2018   Walked 2lpm   2 laps @  approx 284ft each @ avg pace  stopped due to  Sob on 2lpm but no desats     Goal is to keep sats at > 90%, not to turn up the 02 until he's no longer sob/ reviewed  Referred to dme for best fit for amb 02

## 2019-01-05 ENCOUNTER — Other Ambulatory Visit: Payer: Self-pay | Admitting: Gastroenterology

## 2019-01-05 MED FILL — CARVEDILOL 3.125 MG TABLET: 3.125 | 30 days supply | Qty: 60 | Fill #6

## 2019-01-05 MED FILL — XARELTO 20 MG TABLET: 20 | 30 days supply | Qty: 30 | Fill #6

## 2019-01-05 MED FILL — GABAPENTIN 300 MG CAPSULE: 300 | 30 days supply | Qty: 30 | Fill #2

## 2019-01-05 MED FILL — ATORVASTATIN 80 MG TABLET: 80 | 30 days supply | Qty: 30 | Fill #6

## 2019-01-05 MED FILL — PANTOPRAZOLE SOD DR 40 MG T: 40 | 30 days supply | Qty: 30 | Fill #0

## 2019-01-07 ENCOUNTER — Ambulatory Visit: Payer: Medicare HMO

## 2019-01-07 ENCOUNTER — Ambulatory Visit (INDEPENDENT_AMBULATORY_CARE_PROVIDER_SITE_OTHER)
Admission: RE | Admit: 2019-01-07 | Discharge: 2019-01-07 | Disposition: A | Discharge status: Home / Self Care | Payer: Medicare HMO | Source: Ambulatory Visit | Attending: Acute Care | Admitting: Acute Care

## 2019-01-07 ENCOUNTER — Ambulatory Visit (INDEPENDENT_AMBULATORY_CARE_PROVIDER_SITE_OTHER): Payer: Medicare HMO | Admitting: Acute Care

## 2019-01-07 ENCOUNTER — Other Ambulatory Visit: Payer: Self-pay

## 2019-01-07 ENCOUNTER — Encounter: Payer: Self-pay | Admitting: Acute Care

## 2019-01-07 VITALS — BP 108/62 | HR 87 | Temp 98.2°F | Ht 75.0 in | Wt 191.0 lb

## 2019-01-07 DIAGNOSIS — Z122 Encounter for screening for malignant neoplasm of respiratory organs: Secondary | ICD-10-CM

## 2019-01-07 DIAGNOSIS — Z87891 Personal history of nicotine dependence: Secondary | ICD-10-CM | POA: Diagnosis not present

## 2019-01-07 DIAGNOSIS — F17210 Nicotine dependence, cigarettes, uncomplicated: Secondary | ICD-10-CM

## 2019-01-07 NOTE — Progress Notes (Addendum)
Shared Decision Making Visit Lung Cancer Screening Program [phone])   Eligibility:  Age 63 y.o.  Pack Years Smoking History Calculation: 94 pack year smoking history  (# packs/per year x # years smoked)  Recent History of coughing up blood  no  Unexplained weight loss? no ( >Than 15 pounds within the last 6 months )  Prior History Lung / other cancer no (Diagnosis within the last 5 years already requiring surveillance chest CT Scans).  Smoking Status Current Smoker  Former Smokers: Years since quit:NA  Quit Date: NA  Visit Components:  Discussion included one or more decision making aids. yes  Discussion included risk/benefits of screening. yes  Discussion included potential follow up diagnostic testing for abnormal scans. yes  Discussion included meaning and risk of over diagnosis. yes  Discussion included meaning and risk of False Positives. yes  Discussion included meaning of total radiation exposure. yes  Counseling Included:  Importance of adherence to annual lung cancer LDCT screening. yes  Impact of comorbidities on ability to participate in the program. yes  Ability and willingness to under diagnostic treatment. yes  Smoking Cessation Counseling:  Current Smokers:   Discussed importance of smoking cessation. yes  Information about tobacco cessation classes and interventions provided to patient. yes  Patient provided with "ticket" for LDCT Scan. yes  Symptomatic Patient. no  Counseling  Diagnosis Code: Tobacco Use Z72.0  Asymptomatic Patient yes  Counseling (Intermediate counseling: > three minutes counseling) A1937  Former Smokers:   Discussed the importance of maintaining cigarette abstinence. yes  Diagnosis Code: Personal History of Nicotine Dependence. T02.409  Information about tobacco cessation classes and interventions provided to patient. Yes  Patient provided with "ticket" for LDCT Scan. yes  Written Order for Lung Cancer  Screening with LDCT placed in Epic. Yes (CT Chest Lung Cancer Screening Low Dose W/O CM) [id number] Z12.2-Screening of respiratory organs Z87.891-Personal history of nicotine dependence  BP 108/62 (BP Location: Left Arm, Cuff Size: Normal)   Pulse 87   Temp 98.2 F (36.8 C) (Oral)   Ht 6\' 3"  (1.905 m)   Wt 191 lb (86.6 kg)   SpO2 100%   BMI 23.87 kg/m    I have spent 25 minutes of face to face time with Jared [name] discussing the risks and benefits of lung cancer screening. We viewed a power point together that explained in detail the above noted topics. We paused at intervals to allow for questions to be asked and answered to ensure understanding.We discussed that the single most powerful action that he can take to decrease his risk of developing lung cancer is to quit smoking. We discussed whether or not he is ready to commit to setting a quit date. We discussed options for tools to aid in quitting smoking including nicotine replacement therapy, non-nicotine medications, support groups, Quit Smart classes, and behavior modification. We discussed that often times setting smaller, more achievable goals, such as eliminating 1 cigarette a day for a week and then 2 cigarettes a day for a week can be helpful in slowly decreasing the number of cigarettes smoked. This allows for a sense of accomplishment as well as providing a clinical benefit. I gave him the " Be Stronger Than Your Excuses" card with contact information for community resources, classes, free nicotine replacement therapy, and access to mobile apps, text messaging, and on-line smoking cessation help. I have also given him my card and contact information in the event he needs to contact me. We discussed the time and  location of the scan, and that either Abigail Miyamoto RN or I will call with the results within 24-48 hours of receiving them. I have offered him  a copy of the power point we viewed  as a resource in the event they need reinforcement  of the concepts we discussed today in the office. The patient verbalized understanding of all of  the above and had no further questions upon leaving the office. They have my contact information in the event they have any further questions.  I spent 4 minutes counseling on smoking cessation and the health risks of continued tobacco abuse.  I explained to the patient that there has been a high incidence of coronary artery disease noted on these exams. I explained that this is a non-gated exam therefore degree or severity cannot be determined. This patient is currently on statin therapy. I have asked the patient to follow-up with their PCP regarding any incidental finding of coronary artery disease and management with diet or medication as their PCP  feels is clinically indicated. The patient verbalized understanding of the above and had no further questions upon completion of the visit.      Bevelyn Ngo, NP 01/07/2019 10:28 AM

## 2019-01-08 ENCOUNTER — Telehealth: Payer: Self-pay | Admitting: *Deleted

## 2019-01-08 DIAGNOSIS — K0253 Dental caries on pit and fissure surface penetrating into pulp: Secondary | ICD-10-CM | POA: Diagnosis not present

## 2019-01-08 DIAGNOSIS — R69 Illness, unspecified: Secondary | ICD-10-CM | POA: Diagnosis not present

## 2019-01-08 DIAGNOSIS — K91840 Postprocedural hemorrhage and hematoma of a digestive system organ or structure following a digestive system procedure: Secondary | ICD-10-CM | POA: Diagnosis not present

## 2019-01-08 DIAGNOSIS — K06 Other disorders of gingiva and edentulous alveolar ridge: Secondary | ICD-10-CM | POA: Diagnosis not present

## 2019-01-08 NOTE — Telephone Encounter (Signed)
Pt has answered NO to the following covid19 questions.        COVID-19 Pre-Screening Questions:  . In the past 7 to 10 days have you had a cough,  shortness of breath, headache, congestion, fever (100 or greater) body aches, chills, sore throat, or sudden loss of taste or sense of smell? . Have you been around anyone with known Covid 19. . Have you been around anyone who is awaiting Covid 19 test results in the past 7 to 10 days? . Have you been around anyone who has been exposed to Covid 19, or has mentioned symptoms of Covid 19 within the past 7 to 10 days?  If you have any concerns/questions about symptoms patients report during screening (either on the phone or at threshold). Contact the provider seeing the patient or DOD for further guidance.  If neither are available contact a member of the leadership team.            

## 2019-01-09 ENCOUNTER — Telehealth: Payer: Medicare HMO | Admitting: Internal Medicine

## 2019-01-09 ENCOUNTER — Other Ambulatory Visit: Payer: Self-pay

## 2019-01-09 ENCOUNTER — Encounter: Payer: Self-pay | Admitting: Cardiology

## 2019-01-09 ENCOUNTER — Ambulatory Visit (INDEPENDENT_AMBULATORY_CARE_PROVIDER_SITE_OTHER): Payer: Medicare HMO | Admitting: Cardiology

## 2019-01-09 VITALS — BP 118/70 | HR 89 | Ht 75.0 in | Wt 191.1 lb

## 2019-01-09 DIAGNOSIS — J449 Chronic obstructive pulmonary disease, unspecified: Secondary | ICD-10-CM

## 2019-01-09 DIAGNOSIS — I110 Hypertensive heart disease with heart failure: Secondary | ICD-10-CM | POA: Diagnosis not present

## 2019-01-09 DIAGNOSIS — F17200 Nicotine dependence, unspecified, uncomplicated: Secondary | ICD-10-CM

## 2019-01-09 DIAGNOSIS — I10 Essential (primary) hypertension: Secondary | ICD-10-CM

## 2019-01-09 DIAGNOSIS — E785 Hyperlipidemia, unspecified: Secondary | ICD-10-CM | POA: Diagnosis not present

## 2019-01-09 DIAGNOSIS — I5022 Chronic systolic (congestive) heart failure: Secondary | ICD-10-CM | POA: Diagnosis not present

## 2019-01-09 DIAGNOSIS — I4892 Unspecified atrial flutter: Secondary | ICD-10-CM

## 2019-01-09 DIAGNOSIS — I2510 Atherosclerotic heart disease of native coronary artery without angina pectoris: Secondary | ICD-10-CM | POA: Diagnosis not present

## 2019-01-09 NOTE — Progress Notes (Signed)
Cardiology Office Note:    Date:  01/09/2019   ID:  Jared [name], DOB 05/24/1956, MRN [id number]  PCP:  Hoy RegisterNewlin, Enobong, MD  Cardiologist:  Dietrich PatesPaula Ross, MD  Referring MD: Hoy RegisterNewlin, Enobong, MD   Chief Complaint  Patient presents with   Follow-up    CHF    History of Present Illness:    Jared [name] is a 63 y.o. male with a past medical history significant for mild CAD, chronic combined systolic and diastolic heart failure, history of CVA, hypertension, hyperlipidemia, paroxysmal atrial flutter on Xarelto, COPD on oxygen and tobacco abuse. EF 30-35% in 2018, up to 45-50% by echo in 04/2018.  He was seen in our office 07/11/2017 by Dr. Tenny Crawoss at which time he was doing well. BP was a little high so Entresto was increased to 49/51mg .  It appears that [facility] point [facility]   Since his last office visit he has been to the ED or hospitalized 10 times. His HF was noted to be well compensated at visits. He was hospitalized in May of this year for acute on chronic respiratory failure due to COPD exacerbation.  He required BiPAP therapy and was treated with antibiotics, bronchodilators and oral steroids.  He was also found to have maxillary sinusitis on CT scan.  He is followed by pulmonology. EKG showed sinus rhythm.  He was also hospitalized in April and May of this year for GI bleed. 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  Active bleeding was noted on upper endoscopy in April.  In May he received 2 units of packed red blood cells.  His Xarelto was resumed.  [name] is here for 6 month follow up. He is feeling fairly well. He is not able to be very active. He says pulling his oxygen tanks severely limits him. He has 2 tanks on a pully. He has rare mild central chest pressure lasting a minute or 2, sometimes with a sharp jab pain. Mostly occurs when sitting around the house. He has DOE with minimal exertion, about the same over the past  few years. No orthopnea, PND or edema. No lightheadedness and he has not passed out recently.   On 3 L of oxygen for the last 2-3 years.   Past Medical History:  Diagnosis Date   CAD (coronary artery disease)    a. LHC 5/12:  LAD 20, pLCx 20, pRCA 40, dRCA 40, EF 35%, diff HK  //  b. Myoview 4/16: Overall Impression: High risk stress nuclear study There is no evidence of ischemia. There is severe LV dysfunction. LV Ejection Fraction: 30%. LV Wall Motion: There is global LV hypokinesis.    CAP (community acquired pneumonia) 09/2013   Chronic combined systolic and diastolic CHF (congestive heart failure) (HCC)    a. Echo 4/16:Mild LVH, EF 40-45%, diffuse HK //  b. Echo 8/17: EF 35-40%, diffuse HK, diastolic dysfunction, aortic sclerosis, trivial MR, moderate LAE, normal RVSF, moderate RAE, mild TR, PASP 42 mmHg // c. Echo 4/18: Mild concentric LVH, EF 30-35, normal wall motion, grade 1 diastolic dysfunction, PASP 49   Cluster headache    "hx; haven't had one in awhile" (01/09/2016)   COPD (chronic obstructive pulmonary disease) (HCC)    Jared [name]/notes 01/09/2016   History of CVA (cerebrovascular accident)    Hypertension    NICM (nonischemic cardiomyopathy) (HCC)    Tobacco abuse     Past Surgical History:  Procedure  Laterality Date   CARDIAC CATHETERIZATION  10/2010   LM normal, LAD with 20% irregularities, LCX with 20%, RCA with 40% prox and 40% distal - EF of 35%   COLONOSCOPY W/ BIOPSIES AND POLYPECTOMY     COLONOSCOPY WITH PROPOFOL N/A 09/06/2018   Procedure: COLONOSCOPY WITH PROPOFOL;  Surgeon: Tressia Danas, MD;  Location: [facility] ENDOSCOPY;  Service: Gastroenterology;  Laterality: N/A;   ENTEROSCOPY N/A 09/28/2018   Procedure: ENTEROSCOPY;  Surgeon: Jeani Hawking, MD;  Location: [facility] ENDOSCOPY;  Service: Endoscopy;  Laterality: N/A;   ENTEROSCOPY N/A 10/28/2018   Procedure: ENTEROSCOPY;  Surgeon: Tressia Danas, MD;  Location: [facility] ENDOSCOPY;  Service: Gastroenterology;   Laterality: N/A;   ESOPHAGOGASTRODUODENOSCOPY (EGD) WITH PROPOFOL N/A 09/05/2018   Procedure: ESOPHAGOGASTRODUODENOSCOPY (EGD) WITH PROPOFOL;  Surgeon: Benancio Deeds, MD;  Location: [facility] ENDOSCOPY;  Service: Gastroenterology;  Laterality: N/A;   EXCISION MASS HEAD     GIVENS CAPSULE STUDY N/A 09/06/2018   Procedure: GIVENS CAPSULE STUDY;  Surgeon: Tressia Danas, MD;  Location: [facility] ENDOSCOPY;  Service: Gastroenterology;  Laterality: N/A;   GIVENS CAPSULE STUDY N/A 09/26/2018   Procedure: GIVENS CAPSULE STUDY;  Surgeon: Beverley Fiedler, MD;  Location: [facility] ENDOSCOPY;  Service: Gastroenterology;  Laterality: N/A;   HOT HEMOSTASIS N/A 10/28/2018   Procedure: HOT HEMOSTASIS (ARGON PLASMA COAGULATION/BICAP);  Surgeon: Tressia Danas, MD;  Location: [facility] ENDOSCOPY;  Service: Gastroenterology;  Laterality: N/A;   INCISION AND DRAINAGE PERIRECTAL ABSCESS N/A 06/05/2017   Procedure: IRRIGATION AND DEBRIDEMENT PERIRECTAL ABSCESS;  Surgeon: Andria Meuse, MD;  Location: [facility] OR;  Service: General;  Laterality: N/A;   VIDEO BRONCHOSCOPY Bilateral 05/08/2016   Procedure: VIDEO BRONCHOSCOPY WITH FLUORO;  Surgeon: Oretha Milch, MD;  Location: [facility] ENDOSCOPY;  Service: Cardiopulmonary;  Laterality: Bilateral;    Current Medications: Current Meds  Medication Sig   Aclidinium Bromide (TUDORZA PRESSAIR) 400 MCG/ACT AEPB Inhale 1 puff into the lungs 2 (two) times a day.   albuterol (PROVENTIL) (2.5 MG/3ML) 0.083% nebulizer solution Take 3 mLs (2.5 mg total) by nebulization every 4 (four) hours as needed for wheezing or shortness of breath.   albuterol (VENTOLIN HFA) 108 (90 Base) MCG/ACT inhaler Inhale 2 puffs into the lungs every 6 (six) hours as needed for wheezing or shortness of breath.   atorvastatin (LIPITOR) 80 MG tablet Take 1 tablet (80 mg total) by mouth daily.   carvedilol (COREG) 3.125 MG tablet Take 1 tablet (3.125 mg total) by mouth 2 (two) times daily.   cetirizine (ZYRTEC) 10 MG  tablet Take 1 tablet (10 mg total) by mouth daily.   ferrous sulfate 325 (65 FE) MG tablet Take 1 tablet (325 mg total) by mouth daily with breakfast.   fluticasone furoate-vilanterol (BREO ELLIPTA) 100-25 MCG/INH AEPB Inhale 1 puff into the lungs daily.   furosemide (LASIX) 40 MG tablet TAKE 1 TABLET (40 MG TOTAL) BY MOUTH DAILY.   gabapentin (NEURONTIN) 300 MG capsule TAKE 1 CAPSULE (300 MG TOTAL) BY MOUTH AT BEDTIME.   losartan (COZAAR) 50 MG tablet Take 1 tablet (50 mg total) by mouth daily.   Misc. Devices MISC Portable oxygen concentrator. Diagnosis COPD.   nicotine (NICODERM CQ - DOSED IN MG/24 HOURS) 14 mg/24hr patch Place 1 patch (14 mg total) onto the skin daily.   pantoprazole (PROTONIX) 40 MG tablet TAKE 1 TABLET (40 MG TOTAL) BY MOUTH DAILY AT 6 (SIX) AM.   rivaroxaban (XARELTO) 20 MG TABS tablet Take 20 mg by mouth daily with supper.     Allergies:  Lisinopril   Social History   Socioeconomic History   Marital status: Single    Spouse name: Not on file   Number of children: 1   Years of education: Not on file   Highest education level: Not on file  Occupational History   Occupation: retired  Scientist, product/process development strain: Not on file   Food insecurity    Worry: Not on file    Inability: Not on Lexicographer needs    Medical: Not on file    Non-medical: Not on file  Tobacco Use   Smoking status: Current Every Day Smoker    Packs/day: 2.00    Years: 47.00    Pack years: 94.00    Types: Cigarettes   Smokeless tobacco: Never Used   Tobacco comment: 8 cigs daily  Substance and Sexual Activity   Alcohol use: Yes    Alcohol/week: 0.0 standard drinks    Comment: last drink was before xmas   Drug use: No    Types: Cocaine, Marijuana    Comment: "nothing in 20 years"   Sexual activity: Not Currently  Lifestyle   Physical activity    Days per week: Not on file    Minutes per session: Not on file   Stress: Not on file    Relationships   Social connections    Talks on phone: Not on file    Gets together: Not on file    Attends religious service: Not on file    Active member of club or organization: Not on file    Attends meetings of clubs or organizations: Not on file    Relationship status: Not on file  Other Topics Concern   Not on file  Social History Narrative   unemployed     Family History: The patient's family history includes Cancer in his father; Colon cancer in his mother; Diabetes in his brother, mother, and sister; Heart disease in his mother; Liver cancer in his mother. ROS:   Please see the history of present illness.     All other systems reviewed and are negative.  EKGs/Labs/Other Studies Reviewed:    The following studies were reviewed today:  Echocardiogram 05/02/2018 Study Conclusions - Left ventricle: The cavity size was normal. Wall thickness was   normal. Systolic function was mildly reduced. The estimated   ejection fraction was in the range of 45% to 50%. Wall motion was   normal; there were no regional wall motion abnormalities. Doppler   parameters are consistent with abnormal left ventricular   relaxation (grade 1 diastolic dysfunction). - Left atrium: The atrium was mildly dilated.  Impressions: - EF has improved when compared to prior (35%)  EKG:  EKG is not ordered today.    Recent Labs: 05/15/2018: TSH 1.230 09/07/2018: Magnesium 1.9 11/09/2018: ALT 14; B Natriuretic Peptide 238.4 11/10/2018: Hemoglobin 8.8; Platelets 298 11/11/2018: BUN 12; Creatinine, Ser 0.73; Potassium 4.2; Sodium 142   Recent Lipid Panel    Component Value Date/Time   CHOL 206 (H) 08/26/2017 0756   TRIG 168 (H) 08/26/2017 0756   HDL 53 08/26/2017 0756   CHOLHDL 3.9 08/26/2017 0756   CHOLHDL 2.8 01/11/2016 0446   VLDL 14 01/11/2016 0446   LDLCALC 119 (H) 08/26/2017 0756    Physical Exam:    VS:  BP 118/70    Pulse 89    Ht 6\' 3"  (1.905 m)    Wt 191 lb 1.9 oz (86.7 kg)    SpO2  94%    BMI 23.89 kg/m     Wt Readings from Last 3 Encounters:  01/09/19 191 lb 1.9 oz (86.7 kg)  01/07/19 191 lb (86.6 kg)  12/31/18 189 lb 3.2 oz (85.8 kg)     Physical Exam  Constitutional: He is oriented to person, place, and time. He appears well-developed and well-nourished. No distress.  HENT:  Head: Atraumatic.  Neck: Normal range of motion. Neck supple. No JVD present.  Cardiovascular: Normal rate, normal heart sounds and intact distal pulses. Exam reveals no gallop and no friction rub.  No murmur heard. Few early beats noted on exam  Pulmonary/Chest: Effort normal and breath sounds normal. No respiratory distress. He has no rales.  Mildly diminished breath sounds throughout, few faint exp wheezes.   Abdominal: Soft.  Musculoskeletal:        General: No edema.  Neurological: He is alert and oriented to person, place, and time.  Skin: Skin is warm and dry.  Psychiatric: He has a normal mood and affect. His behavior is normal. Judgment and thought content normal.     ASSESSMENT:    1. Chronic systolic CHF (congestive heart failure) (HCC)   2. Atrial flutter, unspecified type (HCC)   3. Coronary artery disease involving native coronary artery of native heart without angina pectoris   4. Essential (primary) hypertension   5. COPD GOLD III/still smoking    6. Tobacco use disorder   7. Hyperlipidemia, unspecified hyperlipidemia type    PLAN:    In order of problems listed above:  Chronic systolic CHF: EF 35-57% in 2018, up to 45-50% by echo in 04/2018. On carvedilol 3.125 mg, losartan 50 mg.  Well compensated.   Paroxysmal atrial flutter: Maintaining sinus rhythm with PVCs.  On Xarelto for stroke risk reduction. No unusual bleeding.  Mild CAD: No anginal symptoms.   Hypertension: On losartan. BP well controlled.   COPD gold 3 -Recent hospitalization in May for acute respiratory failure. Uses chronic Oxygen therapy.  -Patient continues to smoke.  Is being followed  by pulmonology.  On inhalers.  Tobacco abuse: Still smoking ~7 cigarettes per day. Strongly advised on cessation. He says yeah, but I don't think that he is convinced of need to quit or that he has confidence that he can stop.   Hyperlipidemia: LDL was 119 in 08/2017. On atorvastatin per PCP.   Anemia -On iron. Hgb 8.8 on 6/1. Does not note any active bleeding at this time.    Medication Adjustments/Labs and Tests Ordered: Current medicines are reviewed at length with the patient today.  Concerns regarding medicines are outlined above. Labs and tests ordered and medication changes are outlined in the patient instructions below:  Patient Instructions  Medication Instructions:  Your physician recommends that you continue on your current medications as directed. Please refer to the Current Medication list given to you today.  If you need a refill on your cardiac medications before your next appointment, please call your pharmacy.   Lab work: None   If you have labs (blood work) drawn today and your tests are completely normal, you will receive your results only by:  MyChart Message (if you have MyChart) OR  A paper copy in the mail If you have any lab test that is abnormal or we need to change your treatment, we will call you to review the results.  Testing/Procedures: none  Follow-Up: At [facility], you and your health needs are our priority.  As part of our continuing mission  to provide you with exceptional heart care, we have created designated Provider Care Teams.  These Care Teams include your primary Cardiologist (physician) and Advanced Practice Providers (APPs -  Physician Assistants and Nurse Practitioners) who all work together to provide you with the care you need, when you need it. You will need a follow up appointment in:  6 months.  Please call our office 2 months in advance to schedule this appointment.  You may see Dietrich PatesPaula Ross, MD or one of the following Advanced  Practice Providers on your designated Care Team: Tereso NewcomerScott Weaver, PA-C Vin La RoseBhagat, [place] JerseyPA-C  Berton BonJanine Adolphe Fortunato, NP  Any Other Special Instructions Will Be Listed Below (If Applicable).    Coping with Quitting Smoking  Quitting smoking is a physical and mental challenge. You will face cravings, withdrawal symptoms, and temptation. Before quitting, work with your health care provider to make a plan that can help you cope. Preparation can help you quit and keep you from giving in. How can I cope with cravings? Cravings usually last for 5-10 minutes. If you get through it, the craving will pass. Consider taking the following actions to help you cope with cravings:  Keep your mouth busy: ? Chew sugar-free gum. ? Suck on hard candies or a straw. ? Brush your teeth.  Keep your hands and body busy: ? Immediately change to a different activity when you feel a craving. ? Squeeze or play with a ball. ? Do an activity or a hobby, like making bead jewelry, practicing needlepoint, or working with wood. ? Mix up your normal routine. ? Take a short exercise break. Go for a quick walk or run up and down stairs. ? Spend time in public places where smoking is not allowed.  Focus on doing something kind or helpful for someone else.  Call a friend or family member to talk during a craving.  Join a support group.  Call a quit line, such as 1-800-QUIT-NOW.  Talk with your health care provider about medicines that might help you cope with cravings and make quitting easier for you. How can I deal with withdrawal symptoms? Your body may experience negative effects as it tries to get used to not having nicotine in the system. These effects are called withdrawal symptoms. They may include:  Feeling hungrier than normal.  Trouble concentrating.  Irritability.  Trouble sleeping.  Feeling depressed.  Restlessness and agitation.  Craving a cigarette. To manage withdrawal symptoms:  Avoid places,  people, and activities that trigger your cravings.  Remember why you want to quit.  Get plenty of sleep.  Avoid coffee and other caffeinated drinks. These may worsen some of your symptoms. How can I handle social situations? Social situations can be difficult when you are quitting smoking, especially in the first few weeks. To manage this, you can:  Avoid parties, bars, and other social situations where people might be smoking.  Avoid alcohol.  Leave right away if you have the urge to smoke.  Explain to your family and friends that you are quitting smoking. Ask for understanding and support.  Plan activities with friends or family where smoking is not an option. What are some ways I can cope with stress? Wanting to smoke may cause stress, and stress can make you want to smoke. Find ways to manage your stress. Relaxation techniques can help. For example:  Breathe slowly and deeply, in through your nose and out through your mouth.  Listen to soothing, relaxing music.  Talk with a  family member or friend about your stress.  Light a candle.  Soak in a bath or take a shower.  Think about a peaceful place. What are some ways I can prevent weight gain? Be aware that many people gain weight after they quit smoking. However, not everyone does. To keep from gaining weight, have a plan in place before you quit and stick to the plan after you quit. Your plan should include:  Having healthy snacks. When you have a craving, it may help to: ? Eat plain popcorn, crunchy carrots, celery, or other cut vegetables. ? Chew sugar-free gum.  Changing how you eat: ? Eat small portion sizes at meals. ? Eat 4-6 small meals throughout the day instead of 1-2 large meals a day. ? Be mindful when you eat. Do not watch television or do other things that might distract you as you eat.  Exercising regularly: ? Make time to exercise each day. If you do not have time for a long workout, do short bouts of  exercise for 5-10 minutes several times a day. ? Do some form of strengthening exercise, like weight lifting, and some form of aerobic exercise, like running or swimming.  Drinking plenty of water or other low-calorie or no-calorie drinks. Drink 6-8 glasses of water daily, or as much as instructed by your health care provider. Summary  Quitting smoking is a physical and mental challenge. You will face cravings, withdrawal symptoms, and temptation to smoke again. Preparation can help you as you go through these challenges.  You can cope with cravings by keeping your mouth busy (such as by chewing gum), keeping your body and hands busy, and making calls to family, friends, or a helpline for people who want to quit smoking.  You can cope with withdrawal symptoms by avoiding places where people smoke, avoiding drinks with caffeine, and getting plenty of rest.  Ask your health care provider about the different ways to prevent weight gain, avoid stress, and handle social situations. This information is not intended to replace advice given to you by your health care provider. Make sure you discuss any questions you have with your health care provider. Document Released: 05/25/2016 Document Revised: 05/10/2017 Document Reviewed: 05/25/2016 Elsevier Patient Education  2020 [employer]     Signed, Berton BonJanine Angeleena Dueitt, NP  01/09/2019 6:21 PM    [facility]

## 2019-01-09 NOTE — Patient Instructions (Addendum)
Medication Instructions:  Your physician recommends that you continue on your current medications as directed. Please refer to the Current Medication list given to you today.  If you need a refill on your cardiac medications before your next appointment, please call your pharmacy.   Lab work: None   If you have labs (blood work) drawn today and your tests are completely normal, you will receive your results only by: Marland Kitchen MyChart Message (if you have MyChart) OR . A paper copy in the mail If you have any lab test that is abnormal or we need to change your treatment, we will call you to review the results.  Testing/Procedures: none  Follow-Up: At [facility], you and your health needs are our priority.  As part of our continuing mission to provide you with exceptional heart care, we have created designated Provider Care Teams.  These Care Teams include your primary Cardiologist (physician) and Advanced Practice Providers (APPs -  Physician Assistants and Nurse Practitioners) who all work together to provide you with the care you need, when you need it. You will need a follow up appointment in:  6 months.  Please call our office 2 months in advance to schedule this appointment.  You may see Dietrich Pates, MD or one of the following Advanced Practice Providers on your designated Care Team: Tereso Newcomer, PA-C [place], [place] . Berton Bon, NP  Any Other Special Instructions Will Be Listed Below (If Applicable).    Coping with Quitting Smoking  Quitting smoking is a physical and mental challenge. You will face cravings, withdrawal symptoms, and temptation. Before quitting, work with your health care provider to make a plan that can help you cope. Preparation can help you quit and keep you from giving in. How can I cope with cravings? Cravings usually last for 5-10 minutes. If you get through it, the craving will pass. Consider taking the following actions to help you cope with  cravings:  Keep your mouth busy: ? Chew sugar-free gum. ? Suck on hard candies or a straw. ? Brush your teeth.  Keep your hands and body busy: ? Immediately change to a different activity when you feel a craving. ? Squeeze or play with a ball. ? Do an activity or a hobby, like making bead jewelry, practicing needlepoint, or working with wood. ? Mix up your normal routine. ? Take a short exercise break. Go for a quick walk or run up and down stairs. ? Spend time in public places where smoking is not allowed.  Focus on doing something kind or helpful for someone else.  Call a friend or family member to talk during a craving.  Join a support group.  Call a quit line, such as 1-800-QUIT-NOW.  Talk with your health care provider about medicines that might help you cope with cravings and make quitting easier for you. How can I deal with withdrawal symptoms? Your body may experience negative effects as it tries to get used to not having nicotine in the system. These effects are called withdrawal symptoms. They may include:  Feeling hungrier than normal.  Trouble concentrating.  Irritability.  Trouble sleeping.  Feeling depressed.  Restlessness and agitation.  Craving a cigarette. To manage withdrawal symptoms:  Avoid places, people, and activities that trigger your cravings.  Remember why you want to quit.  Get plenty of sleep.  Avoid coffee and other caffeinated drinks. These may worsen some of your symptoms. How can I handle social situations? Social situations can be  difficult when you are quitting smoking, especially in the first few weeks. To manage this, you can:  Avoid parties, bars, and other social situations where people might be smoking.  Avoid alcohol.  Leave right away if you have the urge to smoke.  Explain to your family and friends that you are quitting smoking. Ask for understanding and support.  Plan activities with friends or family where  smoking is not an option. What are some ways I can cope with stress? Wanting to smoke may cause stress, and stress can make you want to smoke. Find ways to manage your stress. Relaxation techniques can help. For example:  Breathe slowly and deeply, in through your nose and out through your mouth.  Listen to soothing, relaxing music.  Talk with a family member or friend about your stress.  Light a candle.  Soak in a bath or take a shower.  Think about a peaceful place. What are some ways I can prevent weight gain? Be aware that many people gain weight after they quit smoking. However, not everyone does. To keep from gaining weight, have a plan in place before you quit and stick to the plan after you quit. Your plan should include:  Having healthy snacks. When you have a craving, it may help to: ? Eat plain popcorn, crunchy carrots, celery, or other cut vegetables. ? Chew sugar-free gum.  Changing how you eat: ? Eat small portion sizes at meals. ? Eat 4-6 small meals throughout the day instead of 1-2 large meals a day. ? Be mindful when you eat. Do not watch television or do other things that might distract you as you eat.  Exercising regularly: ? Make time to exercise each day. If you do not have time for a long workout, do short bouts of exercise for 5-10 minutes several times a day. ? Do some form of strengthening exercise, like weight lifting, and some form of aerobic exercise, like running or swimming.  Drinking plenty of water or other low-calorie or no-calorie drinks. Drink 6-8 glasses of water daily, or as much as instructed by your health care provider. Summary  Quitting smoking is a physical and mental challenge. You will face cravings, withdrawal symptoms, and temptation to smoke again. Preparation can help you as you go through these challenges.  You can cope with cravings by keeping your mouth busy (such as by chewing gum), keeping your body and hands busy, and making  calls to family, friends, or a helpline for people who want to quit smoking.  You can cope with withdrawal symptoms by avoiding places where people smoke, avoiding drinks with caffeine, and getting plenty of rest.  Ask your health care provider about the different ways to prevent weight gain, avoid stress, and handle social situations. This information is not intended to replace advice given to you by your health care provider. Make sure you discuss any questions you have with your health care provider. Document Released: 05/25/2016 Document Revised: 05/10/2017 Document Reviewed: 05/25/2016 Elsevier Patient Education  2020 [employer].

## 2019-01-12 MED FILL — LOSARTAN POTASSIUM 50 MG TA: 50 | 30 days supply | Qty: 30 | Fill #6

## 2019-01-16 ENCOUNTER — Other Ambulatory Visit: Payer: Self-pay | Admitting: *Deleted

## 2019-01-16 DIAGNOSIS — Z87891 Personal history of nicotine dependence: Secondary | ICD-10-CM

## 2019-01-16 DIAGNOSIS — Z122 Encounter for screening for malignant neoplasm of respiratory organs: Secondary | ICD-10-CM

## 2019-01-16 DIAGNOSIS — F17210 Nicotine dependence, cigarettes, uncomplicated: Secondary | ICD-10-CM

## 2019-01-28 ENCOUNTER — Other Ambulatory Visit: Payer: Self-pay

## 2019-01-28 ENCOUNTER — Encounter: Payer: Self-pay | Admitting: Internal Medicine

## 2019-01-28 ENCOUNTER — Ambulatory Visit (INDEPENDENT_AMBULATORY_CARE_PROVIDER_SITE_OTHER): Payer: Medicare HMO | Admitting: Internal Medicine

## 2019-01-28 DIAGNOSIS — J9611 Chronic respiratory failure with hypoxia: Secondary | ICD-10-CM | POA: Diagnosis not present

## 2019-01-28 DIAGNOSIS — F17210 Nicotine dependence, cigarettes, uncomplicated: Secondary | ICD-10-CM

## 2019-01-28 DIAGNOSIS — J9612 Chronic respiratory failure with hypercapnia: Secondary | ICD-10-CM | POA: Diagnosis not present

## 2019-01-28 DIAGNOSIS — J449 Chronic obstructive pulmonary disease, unspecified: Secondary | ICD-10-CM

## 2019-01-28 DIAGNOSIS — J320 Chronic maxillary sinusitis: Secondary | ICD-10-CM | POA: Diagnosis not present

## 2019-01-28 MED ORDER — TUDORZA PRESSAIR 400 MCG/ACT IN AEPB: 1.0000 | INHALATION_SPRAY | Freq: Two times a day (BID) | RESPIRATORY_TRACT | 5 refills | Status: DC

## 2019-01-28 NOTE — Patient Instructions (Addendum)
Resume turdorza one click twice daily  - if can't afford it can just use the sample one click each am   02 should be 3lpm POC walking and 2lpm at rest and we will refer you for POC as you are eligible if your provider has them available (this is not a guarantee though   Only use your albuterol as a rescue medication to be used if you can't catch your breath by resting or doing a relaxed purse lip breathing pattern.  - The less you use it, the better it will work when you need it. - Ok to use up to 2 puffs  every 4 hours if you must but call for immediate appointment if use goes up over your usual need - Don't leave home without it !!  (think of it like the spare tire for your car)   If you plan a strenuous activity, ok to try either the albuterol (Ventolin) 15 min before or nebulizer to see if helps your breathing and if not don't use them for this purpose   The key is to stop smoking completely before smoking completely stops you!  For smoking cessation classes call [phone]       Please schedule a follow up office visit in 6 weeks, call sooner if needed with all medications /inhalers/ solutions in hand so we can verify exactly what you are taking. This includes all medications from all doctors and over the counters

## 2019-01-28 NOTE — Progress Notes (Signed)
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 He also reports dyspnea on exertion. In addition, he reports epigastric abdominal pain, which may radiate to the lower portion of his sternum versus chest pain . He denies any lower extremity edema. Admits to salt rich food ingestion .The symptoms are similar to those of one week ago, at which time, workup was suspicious for acute exacerbation of systolic heart failure. She was started on Cozaar, metoprolol and Lasix 20 mg daily at the time, and was recommended to see cardiology, which he failed to do so. His cardiologist is Dr. Dorris Carnes, last seen in December 2016.the patient continues to smoke 1 pack a day of cigarettes  Discharge Diagnoses:  Acute respiratory failure secondary to CHF exacerbation -O2 sats upon admission 89% -CXR unremarkable for infection -Responded well to IVlasix-->po lasix -Weaned off of O2-SpO2 95% on room air  Acute combined systolic/diastolic heart failure -BNP 2056.8 -Echocardiogram 09/20/2014 showed EF 40-45% -Transition to by mouth Lasix 20 mg daily -Echocardiogram 01/10/2016: EF 56-21%, diastolic dysfunction --AST 147, ALT 110 upon admission -Currently trending downward -hepatitis panel unremarkable -Patient denies drug/alcohol use  -switch metoprolol-->coreg -continue losartan  Essential Hypertension -Has not been taking home medications for over a year due to lack of insurance and monetary funds -Restarted on losartan, Lasix --switch metoprolol-->coreg 6.25 mg bid  CAD -Currently no chest pain -Continue coreg, losartan, ASA 81 mg daily -LDL 80  Tobacco Abuse -Smoking cessation discussed -Continue nicotine patch  Moderate malnutrition -Nutrition consult   History of Present Illness  02/29/2016 1st [facility] office visit/ [name]  symb 160 2bid/ bid saba (not prn)  Chief Complaint  Patient presents with  . Pulmonary Consult    Pt referred by [name], PA, for cough, hemoptysis, and emphysema. Pt c/o hemoptysis x 3 weeks, pt states this occurs daily but has been improving. Pt c/o increase in SOB with activity and rest.   acute  onset epistaxis/ hemoptyis end of Auguest > ER eval 02/20/16 dx RLL pna by CTa chest > rx levaquin/ prednisone and minimal hemoptysis now  Doe = MMRC2 = can't walk a nl pace on a flat grade s sob but does fine slow and flat eg [employer] shopping rec Plan A = Automatic =  Symbicort 160 Take 2 puffs first thing in am and then another 2 puffs about 12 hours later.  Work on inhaler technique: Plan B = Backup Only use your albuterol as a rescue medication\ Plan C = Crisis - only use your albuterol nebulizer if you first try Plan B and it fails to help > ok to use the nebulizer up to every 4 hours but if start needing it regularly call for immediate appointment  The key is to stop smoking completely before smoking completely stops you!  Please schedule a follow up office visit in  2 weeks, sooner if needed with cxr on return > did not return   Admit date: 05/04/2016 Discharge date: 05/09/2016    DISCHARGE DIAGNOSES:  Principal Problem:   Hemoptysis Active Problems:   Community acquired pneumonia of right lower lobe of lung (HCC) - - FOB RLL tbbx ATYPICAL PNEUMOCYTES  PROLIFERATION/ LLL BAL 05/08/16    Hypertension   COPD with acute exacerbation (HCC)   Nonischemic dilated cardiomyopathy (HCC)   Cigarette smoker   Chronic systolic CHF (congestive heart failure) (HCC)   06/03/16 to ER> only better while on neb    06/26/2016  f/u ov/[name] re:  GOLD III copd / maint symb 160 / saba  Chief Complaint  Patient presents with  . Follow-up    pt had CT today. pt states breathing has worsen since last OV. pt c/o increased sob with exertion, prod cough with white mucus, wheezing & chest tightness,  breathing was worse w/in 2 days of last ov / then ran out of lasix one week ago and swelling started to get worse Also worse orthopnea ? Related to leg swelling in terms of timing but pt not sure  Doe now = MMRC3 = can't walk 100 yards even at a slow pace at a flat grade s stopping due to sob  rec Bisoprolol 5 mg one daily in place of the twice daily corevidol and resume your lasix and potassium  Plan A = Automatic = stop symbicort and start bevespi Take 2 puffs first thing in am and then another 2 puffs about 12 hours later.  Plan B = Backup Only use your albuterol as a rescue medication The key is to stop smoking completely before smoking completely stops you!  Please schedule a follow up office visit in 4 weeks, sooner if needed bring all meds      12/31/2018  f/u ov/[name] re: re establish re copd now BREO ? Some bladder problems on LAMA?  Chief Complaint  Patient presents with  . Follow-up    Breathing seems slightly worse since the last visit. He is using his ventolin inhaler once per day and nebs about 2 x per day.   Dyspnea:  Worse for at least a year no better with 02/ some discomfort off 02 " like tightness "  Currently MMRC3 = can't walk 100 yards even at a slow pace at a flat grade s stopping due to sob even on 02 up to 4lpm  Cough: none  Sleeping: flat ok / no am cough /  SABA use: as above  Better p nebs/ over using  02: 3lpm 24/7  rec We need  you need you to monitor your 02 level to be sure it's above 90% Be sure protonix 40 mg Take 30-60 min before first meal of the day  We will ask [facility] to do BEST FIT analysis for longterm 02 The key is to stop smoking completely before smoking completely stops you! Add tudorza one puff twice daily  Only use your albuterol as a rescue medication  Only use nebulizer if you try the inhaler first  = change duoneb to pure albuterol 2.5 mg up to  every 4 hours is the max  Prednisone 10 mg take  4 each am x 2 days,   2 each am x 2 days,  1 each am x 2 days and stop  Please schedule a follow up office visit in 4 weeks, sooner if needed  with all medications /inhalers/ solutions in hand so we  can verify exactly what you are taking. This includes all medications from all doctors and over the counters Add: f/u sinus dz by CT    01/28/2019  f/u ov/Evanie Buckle re:  GOLD III/ still smoking  Chief Complaint  Patient presents with  . Follow-up    Patient reports that his breathing is the same since last visit. He reports using his rescue inhaler/nebulizer daily.  Dyspnea:  MMRC3 = can't walk 100 yards even at a slow pace at a flat grade s stopping due to sob  Even on 3lpm but does not check sats Cough: some in am min mucoid  Sleeping: able to lie flat one pillow  SABA use: both albuterol neb  02: 2lpm sleep / increases to 3lpm with activity inside house/ 2lpm at rest and portable at 2lpm   No obvious day to day or daytime variability or assoc excess/ purulent sputum or mucus plugs or hemoptysis or cp or chest tightness, subjective wheeze or overt sinus or hb symptoms.   sleeping without nocturnal  or early am exacerbation  of respiratory  c/o's or need for noct saba. Also denies any obvious fluctuation of symptoms with weather or environmental changes or other aggravating or alleviating factors except as outlined above   No unusual exposure hx or h/o childhood pna/ asthma or knowledge of premature birth.   Current Allergies, Complete Past Medical History, Past Surgical History, Family History, and Social History were reviewed in [facility] electronic medical record.  ROS  The following are not active complaints unless bolded Hoarseness, sore throat, dysphagia, dental problems, itching, sneezing,  nasal congestion or discharge of excess mucus or purulent secretions, ear ache,   fever, chills, sweats, unintended wt loss or wt gain, classically pleuritic or exertional cp,  orthopnea pnd or arm/hand swelling  or leg swelling, presyncope, palpitations, abdominal pain, anorexia, nausea, vomiting, diarrhea  or change in bowel habits or change in bladder habits, change in stools or change in urine, dysuria, hematuria,  rash, arthralgias, visual complaints, headache, numbness, weakness or ataxia or problems with walking or coordination,  change in mood or  memory.        Current Meds  Medication Sig  . Aclidinium Bromide (TUDORZA PRESSAIR) 400 MCG/ACT AEPB Inhale 1 puff into the lungs 2 (two) times a day.  . albuterol (PROVENTIL) (2.5 MG/3ML) 0.083% nebulizer solution Take 3 mLs (2.5 mg total) by nebulization every 4 (four) hours as needed for wheezing or shortness of breath.  Marland Kitchen. albuterol (VENTOLIN HFA) 108 (90 Base) MCG/ACT inhaler Inhale 2 puffs into the lungs every 6 (six) hours as needed for wheezing or shortness of breath.  Marland Kitchen. atorvastatin (LIPITOR) 80 MG tablet Take 1 tablet (80 mg total) by mouth daily.  . carvedilol (COREG) 3.125 MG tablet Take 1 tablet (3.125 mg total) by mouth 2 (two) times daily.  . cetirizine (ZYRTEC) 10 MG tablet Take 1 tablet (10 mg total) by mouth daily.  . ferrous sulfate 325 (65 FE) MG tablet Take 1 tablet (325 mg total) by mouth daily with breakfast.  . fluticasone (FLONASE) 50 MCG/ACT nasal spray Place 1 spray into both nostrils daily for 30 days.  . fluticasone furoate-vilanterol (BREO ELLIPTA) 100-25 MCG/INH AEPB Inhale 1 puff into the lungs daily.  . furosemide  (LASIX) 40 MG tablet TAKE 1 TABLET (40 MG TOTAL) BY MOUTH DAILY.  Marland Kitchen. gabapentin (NEURONTIN) 300 MG capsule TAKE 1 CAPSULE (300 MG TOTAL) BY MOUTH AT BEDTIME.  Marland Kitchen. losartan (COZAAR) 50 MG tablet Take 1  tablet (50 mg total) by mouth daily.  . Misc. Devices MISC Portable oxygen concentrator. Diagnosis COPD.  . nicotine (NICODERM CQ - DOSED IN MG/24 HOURS) 14 mg/24hr patch Place 1 patch (14 mg total) onto the skin daily.  . pantoprazole (PROTONIX) 40 MG tablet TAKE 1 TABLET (40 MG TOTAL) BY MOUTH DAILY AT 6 (SIX) AM.  . rivaroxaban (XARELTO) 20 MG TABS tablet Take 20 mg by mouth daily with supper.                      Objective:   Physical Exam  Anxious bm nad at rest   01/28/2019      191 12/31/2018      189  10/22/2016      190  07/24/2016        209  06/26/2016        213  05/28/2016      194   02/29/16 177 lb 12.8 oz (80.6 kg)  02/24/16 182 lb 12.8 oz (82.9 kg)  02/20/16 175 lb (79.4 kg)        HEENT:  Poor dentition/ nl  oropharynx. Nl external ear canals without cough reflex -  Mild bilateral non-specific turbinate edema     NECK :  without JVD/Nodes/TM/ nl carotid upstrokes bilaterally   LUNGS: no acc muscle use,  Mod barrel  contour chest wall with bilateral  Distant bs s audible wheeze and  without cough on insp or exp maneuvers and mod  Hyperresonant  to  percussion bilaterally     CV:  RRR  no s3 or murmur or increase in P2, and no edema   ABD:  soft and nontender with pos mid insp Hoover's  in the supine position. No bruits or organomegaly appreciated, bowel sounds nl  MS:     ext warm without deformities, calf tenderness, cyanosis or clubbing No obvious joint restrictions   SKIN: warm and dry without lesions    NEURO:  alert, approp, nl sensorium with  no motor or cerebellar deficits apparent.               Reviewed 01/28/2019 discussed again with pt:  Sinus CT 11/09/18 Odontogenic right maxillary sinusitis with fluid level             Assessment &  Plan:

## 2019-01-29 ENCOUNTER — Encounter: Payer: Self-pay | Admitting: Internal Medicine

## 2019-01-29 DIAGNOSIS — J320 Chronic maxillary sinusitis: Secondary | ICD-10-CM | POA: Insufficient documentation

## 2019-01-29 NOTE — Assessment & Plan Note (Signed)
Sinus CT 11/09/18 : Odontogenic right maxillary sinusitis with fluid level -  01/28/2019  advised dental f/u asap and if not better p that see ent

## 2019-01-29 NOTE — Assessment & Plan Note (Addendum)
See copd - 10/22/2016  Walked RA x 3 laps @ 185 ft each stopped due to  End of study moderate pace, no desat  - HCO03  11/11/18  = 41  - 12/31/2018   Walked 2lpm   2 laps @  approx 229ft each @ avg pace  stopped due to  Sob on 2lpm but no desats   - 01/28/2019   Walked RA x one lap =  approx 250 ft - stopped due to  Sob with sats ok on 3lpm POC > referred for POC   Advised 2lpm conc for sleep, 3lpm with activity and POC outside house at 3lpm while walking then back to 2lpm at rest to conserve tank/battery. Also advised can't smoke and use 02 .   I had an extended discussion with the patient  reviewing all relevant studies completed to date and  lasting 15 to 20 minutes of a 25 minute visit  which included directly observing ambulatory 02 saturation study documented in a/p section of  today's  office note.  I performed device teaching  using a teach back technique which also  extended face to face time for this visit (see above)   Each maintenance medication was reviewed in detail including most importantly the difference between maintenance and prns and under what circumstances the prns are to be triggered using an action plan format that is not reflected in the computer generated alphabetically organized AVS.     Please see AVS for specific instructions unique to this visit that I personally wrote and verbalized to the the pt in detail and then reviewed with pt  by my nurse highlighting any changes in therapy recommended at today's visit .

## 2019-01-29 NOTE — Assessment & Plan Note (Addendum)
Active smoker 02/29/2016  After extensive coaching HFA effectiveness =    75% > continue symbicort  - Spirometry 05/28/2016  FEV1 1.48 (40%)  Ratio 46  p am symbicort 160 2bid  - 06/26/2016  After extensive coaching HFA effectiveness =    90% > bevespi 2 bid and change coreg to bisoprolol 5 mg daily > not done as of 07/24/2016 > at ov 10/22/2016 off coreg and all inhalers and much better sob but still some cough on entresto maint - Spirometry 10/22/2016  FEV1 1.24 (35%)  Ratio 48 - 12/31/2018    tudorza rx one puff bid  - 01/28/2019  After extensive coaching inhaler device,  effectiveness =    90% with tudorza    Group D in terms of symptom/risk and laba/lama/ICS  therefore appropriate rx at this point  >>>  Continue brei.tudorza and f/u in 6 weeks with all meds in hand using a trust but verify approach to confirm accurate Medication  Reconciliation The principal here is that until we are certain that the  patients are doing what we've asked, it makes no sense to ask them to do more.

## 2019-01-29 NOTE — Assessment & Plan Note (Signed)
4-5 min discussion re active cigarette smoking in addition to office E&M  Ask about tobacco use:   ongoing Advise quitting   I emphasized that although we never turn away smokers from the pulmonary clinic, we do ask that they understand that the recommendations that we make  won't work nearly as well in the presence of continued cigarette exposure. In fact, we may very well  reach a point where we can't promise to help the patient if he/she can't quit smoking. (We can and will promise to try to help, we just can't promise what we recommend will really work)  Assess willingness:  Not committed at this point Assist in quit attempt:  Per PCP when ready Arrange follow up:   Follow up per Primary Care planned       

## 2019-02-12 ENCOUNTER — Telehealth: Payer: Self-pay

## 2019-02-12 DIAGNOSIS — I110 Hypertensive heart disease with heart failure: Secondary | ICD-10-CM

## 2019-02-12 MED ORDER — RIVAROXABAN 20 MG PO TABS: 20.0000 mg | ORAL_TABLET | Freq: Every day | ORAL | 6 refills | Status: DC

## 2019-02-12 MED ORDER — GABAPENTIN 300 MG PO CAPS: 300.0000 mg | ORAL_CAPSULE | Freq: Every day | ORAL | 6 refills | Status: DC

## 2019-02-12 MED ORDER — PANTOPRAZOLE SODIUM 40 MG PO TBEC: 40.0000 mg | DELAYED_RELEASE_TABLET | Freq: Every day | ORAL | 3 refills | Status: DC

## 2019-02-12 MED ORDER — FUROSEMIDE 40 MG PO TABS: 40.0000 mg | ORAL_TABLET | Freq: Every day | ORAL | 6 refills | Status: DC

## 2019-02-12 MED FILL — FUROSEMIDE 40 MG TAB: 40 | 30 days supply | Qty: 30 | Fill #0

## 2019-02-12 MED FILL — XARELTO 20 MG TABLET: 20 | 30 days supply | Qty: 30 | Fill #0

## 2019-02-12 MED FILL — GABAPENTIN 300 MG CAPSULE: 300 | 30 days supply | Qty: 30 | Fill #0

## 2019-02-12 MED FILL — PANTOPRAZOLE SOD DR 40 MG T: 40 | 30 days supply | Qty: 30 | Fill #0

## 2019-02-12 NOTE — Telephone Encounter (Signed)
Refilled

## 2019-02-12 NOTE — Telephone Encounter (Signed)
Patient is needing refills on:   Gabapentin Xarelto Lasix Protonix  Sent to [facility]

## 2019-02-13 NOTE — Telephone Encounter (Signed)
Patient was a voicemail was left informing patient that medications has been refilled and sent to onsite pharmacy.

## 2019-02-24 DIAGNOSIS — K91840 Postprocedural hemorrhage and hematoma of a digestive system organ or structure following a digestive system procedure: Secondary | ICD-10-CM | POA: Diagnosis not present

## 2019-02-24 DIAGNOSIS — K06 Other disorders of gingiva and edentulous alveolar ridge: Secondary | ICD-10-CM | POA: Diagnosis not present

## 2019-02-24 DIAGNOSIS — K0253 Dental caries on pit and fissure surface penetrating into pulp: Secondary | ICD-10-CM | POA: Diagnosis not present

## 2019-03-04 MED FILL — ATORVASTATIN 80 MG TABLET: 80 | 30 days supply | Qty: 30 | Fill #0

## 2019-03-04 MED FILL — CARVEDILOL 3.125 MG TABLET: 3.125 | 30 days supply | Qty: 60 | Fill #0

## 2019-03-04 MED FILL — LOSARTAN POTASSIUM 50 MG TA: 50 | 30 days supply | Qty: 30 | Fill #0

## 2019-03-09 ENCOUNTER — Ambulatory Visit: Payer: Medicare HMO | Admitting: Internal Medicine

## 2019-03-09 ENCOUNTER — Other Ambulatory Visit: Payer: Self-pay

## 2019-03-09 ENCOUNTER — Ambulatory Visit (INDEPENDENT_AMBULATORY_CARE_PROVIDER_SITE_OTHER): Payer: Medicare HMO | Admitting: Primary Care

## 2019-03-09 ENCOUNTER — Encounter: Payer: Self-pay | Admitting: Primary Care

## 2019-03-09 VITALS — BP 110/60 | HR 94 | Temp 97.5°F | Ht 75.0 in | Wt 195.6 lb

## 2019-03-09 DIAGNOSIS — J9611 Chronic respiratory failure with hypoxia: Secondary | ICD-10-CM

## 2019-03-09 DIAGNOSIS — Z23 Encounter for immunization: Secondary | ICD-10-CM

## 2019-03-09 DIAGNOSIS — J449 Chronic obstructive pulmonary disease, unspecified: Secondary | ICD-10-CM

## 2019-03-09 MED ORDER — TUDORZA PRESSAIR 400 MCG/ACT IN AEPB: 1.0000 | INHALATION_SPRAY | Freq: Two times a day (BID) | RESPIRATORY_TRACT | 0 refills | Status: DC

## 2019-03-09 NOTE — Progress Notes (Signed)
Chart and office note reviewed in detail  > agree with a/p as outlined    

## 2019-03-09 NOTE — Assessment & Plan Note (Signed)
Continue 3L oxygen on exertion and 2L at rest

## 2019-03-09 NOTE — Patient Instructions (Addendum)
Office treatment: High dose flu shot given  Rx: Tudorza 1 puff twice daily (two sample give)  Referral: [facility] for COPD symptoms/medication management (unable to afford tudorza)  Recommendations: Continue to work on quitting smoking, you have down a GREAT job so far cutting back  Follow-up 3 months with Dr. [name]    Steps to Quit Smoking Smoking tobacco is the leading cause of preventable death. It can affect almost every organ in the body. Smoking puts you and people around you at risk for many serious, long-lasting (chronic) diseases. Quitting smoking can be hard, but it is one of the best things that you can do for your health. It is never too late to quit. How do I get ready to quit? When you decide to quit smoking, make a plan to help you succeed. Before you quit:  Pick a date to quit. Set a date within the next 2 weeks to give you time to prepare.  Write down the reasons why you are quitting. Keep this list in places where you will see it often.  Tell your family, friends, and co-workers that you are quitting. Their support is important.  Talk with your doctor about the choices that may help you quit.  Find out if your health insurance will pay for these treatments.  Know the people, places, things, and activities that make you want to smoke (triggers). Avoid them. What first steps can I take to quit smoking?  Throw away all cigarettes at home, at work, and in your car.  Throw away the things that you use when you smoke, such as ashtrays and lighters.  Clean your car. Make sure to empty the ashtray.  Clean your home, including curtains and carpets. What can I do to help me quit smoking? Talk with your doctor about taking medicines and seeing a counselor at the same time. You are more likely to succeed when you do both.  If you are pregnant or breastfeeding, talk with your doctor about counseling or other ways to quit smoking. Do not take medicine to help you quit  smoking unless your doctor tells you to do so. To quit smoking: Quit right away  Quit smoking totally, instead of slowly cutting back on how much you smoke over a period of time.  Go to counseling. You are more likely to quit if you go to counseling sessions regularly. Take medicine You may take medicines to help you quit. Some medicines need a prescription, and some you can buy over-the-counter. Some medicines may contain a drug called nicotine to replace the nicotine in cigarettes. Medicines may:  Help you to stop having the desire to smoke (cravings).  Help to stop the problems that come when you stop smoking (withdrawal symptoms). Your doctor may ask you to use:  Nicotine patches, gum, or lozenges.  Nicotine inhalers or sprays.  Non-nicotine medicine that is taken by mouth. Find resources Find resources and other ways to help you quit smoking and remain smoke-free after you quit. These resources are most helpful when you use them often. They include:  Online chats with a Veterinary surgeon.  Phone quitlines.  Printed Materials engineer.  Support groups or group counseling.  Text messaging programs.  Mobile phone apps. Use apps on your mobile phone or tablet that can help you stick to your quit plan. There are many free apps for mobile phones and tablets as well as websites. Examples include Quit Guide from the [employer] and smokefree.gov  What things can I do  to make it easier to quit?   Talk to your family and friends. Ask them to support and encourage you.  Call a phone quitline (1-800-QUIT-NOW), reach out to support groups, or work with a Social worker.  Ask people who smoke to not smoke around you.  Avoid places that make you want to smoke, such as: ? Bars. ? Parties. ? Smoke-break areas at work.  Spend time with people who do not smoke.  Lower the stress in your life. Stress can make you want to smoke. Try these things to help your stress: ? Getting regular  exercise. ? Doing deep-breathing exercises. ? Doing yoga. ? Meditating. ? Doing a body scan. To do this, close your eyes, focus on one area of your body at a time from head to toe. Notice which parts of your body are tense. Try to relax the muscles in those areas. How will I feel when I quit smoking? Day 1 to 3 weeks Within the first 24 hours, you may start to have some problems that come from quitting tobacco. These problems are very bad 2-3 days after you quit, but they do not often last for more than 2-3 weeks. You may get these symptoms:  Mood swings.  Feeling restless, nervous, angry, or annoyed.  Trouble concentrating.  Dizziness.  Strong desire for high-sugar foods and nicotine.  Weight gain.  Trouble pooping (constipation).  Feeling like you may vomit (nausea).  Coughing or a sore throat.  Changes in how the medicines that you take for other issues work in your body.  Depression.  Trouble sleeping (insomnia). Week 3 and afterward After the first 2-3 weeks of quitting, you may start to notice more positive results, such as:  Better sense of smell and taste.  Less coughing and sore throat.  Slower heart rate.  Lower blood pressure.  Clearer skin.  Better breathing.  Fewer sick days. Quitting smoking can be hard. Do not give up if you fail the first time. Some people need to try a few times before they succeed. Do your best to stick to your quit plan, and talk with your doctor if you have any questions or concerns. Summary  Smoking tobacco is the leading cause of preventable death. Quitting smoking can be hard, but it is one of the best things that you can do for your health.  When you decide to quit smoking, make a plan to help you succeed.  Quit smoking right away, not slowly over a period of time.  When you start quitting, seek help from your doctor, family, or friends. This information is not intended to replace advice given to you by your health  care provider. Make sure you discuss any questions you have with your health care provider. Document Released: 03/24/2009 Document Revised: 08/15/2018 Document Reviewed: 08/16/2018 Elsevier Patient Education  2020 [employer].

## 2019-03-09 NOTE — Assessment & Plan Note (Addendum)
Reports improvement with Jared [name] but not covered. He has been on bevespi and spiriva in the past   Plan: - Continue Tudorza 1 puff twice daily  - Refer to [facility] - may need PA processed  - Continue to encourage smoking cessation - High dose flu shot received today  - Follow up in 3 months with Jared [name]

## 2019-03-09 NOTE — Progress Notes (Signed)
@Patient  ID: Jared [name], male    DOB: 1955/09/15, 63 y.o.   MRN: [id number]  Chief Complaint  Patient presents with  . Follow-up    Patient reports that he still has sob with exertion. He reports that the Jared [name] has helped.     Referring provider: Charlott Rakes, MD  HPI: 63 year old male, current smoker. PMH significant for COPD GOLD III, chronic respiratory failure with hypoxia, centrilobular emphysema, HTN, CAD, aflutter, AV malformation, elevated LFTs, prediabetes. Patient of Dr. Melvyn [name], last seen on 01/28/19.   03/09/2019 Patient presents today for 1 month follow-up. Doing ok, some shortness of breath on exertion. He ran out of Jared [name] samples 3 days ago. Feels Jared [name] has helped his breathing the most. He is not currently taking Breo. He has tried Bevespi and Spiriva in the past. Using portable oxygen at 3L. Cough is baseline. He is down to 5 cigarettes a day. Denies purulent mucus, wheezing or fever.   Spirometry 10/22/2016  FEV1 1.24 (35%)  Ratio 48 Allergies  Allergen Reactions  . Lisinopril Cough    Immunization History  Administered Date(s) Administered  . Fluad Quad(high Dose 65+) 03/09/2019  . Influenza,inj,Quad PF,6+ Mos 03/20/2016, 05/02/2018  . Influenza-Unspecified 03/28/2017  . Pneumococcal Polysaccharide-23 01/10/2016, 05/02/2018    Past Medical History:  Diagnosis Date  . CAD (coronary artery disease)    a. LHC 5/12:  LAD 20, pLCx 20, pRCA 40, dRCA 40, EF 35%, diff HK  //  b. Myoview 4/16: Overall Impression: High risk stress nuclear study There is no evidence of ischemia. There is severe LV dysfunction. LV Ejection Fraction: 30%. LV Wall Motion: There is global LV hypokinesis.   Marland Kitchen CAP (community acquired pneumonia) 09/2013  . Chronic combined systolic and diastolic CHF (congestive heart failure) (HCC)    a. Echo 4/16:Mild LVH, EF 40-45%, diffuse HK //  b. Echo 8/17: EF 35-40%, diffuse HK, diastolic dysfunction, aortic sclerosis, trivial MR, moderate LAE,  normal RVSF, moderate RAE, mild TR, PASP 42 mmHg // c. Echo 4/18: Mild concentric LVH, EF 30-35, normal wall motion, grade 1 diastolic dysfunction, PASP 49  . Cluster headache    "hx; haven't had one in awhile" (01/09/2016)  . COPD (chronic obstructive pulmonary disease) ([place])    Jared [name] 01/09/2016  . History of CVA (cerebrovascular accident)   . Hypertension   . NICM (nonischemic cardiomyopathy) ([place])   . Tobacco abuse     Tobacco History: Social History   Tobacco Use  Smoking Status Current Every Day Smoker  . Packs/day: 2.00  . Years: 47.00  . Pack years: 94.00  . Types: Cigarettes  Smokeless Tobacco Never Used  Tobacco Comment   5 cigs daily(03/09/19)   Ready to quit: Not Answered Counseling given: Not Answered Comment: 5 cigs daily(03/09/19)   Outpatient Medications Prior to Visit  Medication Sig Dispense Refill  . Aclidinium Bromide (TUDORZA PRESSAIR) 400 MCG/ACT AEPB Inhale 1 puff into the lungs 2 (two) times daily. 60 each 5  . albuterol (PROVENTIL) (2.5 MG/3ML) 0.083% nebulizer solution Take 3 mLs (2.5 mg total) by nebulization every 4 (four) hours as needed for wheezing or shortness of breath. 75 mL 12  . albuterol (VENTOLIN HFA) 108 (90 Base) MCG/ACT inhaler Inhale 2 puffs into the lungs every 6 (six) hours as needed for wheezing or shortness of breath. 1 Inhaler 5  . atorvastatin (LIPITOR) 80 MG tablet Take 1 tablet (80 mg total) by mouth daily. 30 tablet 6  . carvedilol (COREG) 3.125 MG tablet Take  1 tablet (3.125 mg total) by mouth 2 (two) times daily. 60 tablet 6  . cetirizine (ZYRTEC) 10 MG tablet Take 1 tablet (10 mg total) by mouth daily. 30 tablet 1  . ferrous sulfate 325 (65 FE) MG tablet Take 1 tablet (325 mg total) by mouth daily with breakfast. 60 tablet 1  . fluticasone (FLONASE) 50 MCG/ACT nasal spray Place 1 spray into both nostrils daily for 30 days. 0.002 g 0  . furosemide (LASIX) 40 MG tablet Take 1 tablet (40 mg total) by mouth daily. 30 tablet 6  .  gabapentin (NEURONTIN) 300 MG capsule Take 1 capsule (300 mg total) by mouth at bedtime. 30 capsule 6  . losartan (COZAAR) 50 MG tablet Take 1 tablet (50 mg total) by mouth daily. 30 tablet 6  . Misc. Devices MISC Portable oxygen concentrator. Diagnosis COPD. 1 each 0  . nicotine (NICODERM CQ - DOSED IN MG/24 HOURS) 14 mg/24hr patch Place 1 patch (14 mg total) onto the skin daily. 28 patch 0  . pantoprazole (PROTONIX) 40 MG tablet Take 1 tablet (40 mg total) by mouth daily at 6 (six) AM. 30 tablet 3  . rivaroxaban (XARELTO) 20 MG TABS tablet Take 1 tablet (20 mg total) by mouth daily with supper. 30 tablet 6  . fluticasone furoate-vilanterol (BREO ELLIPTA) 100-25 MCG/INH AEPB Inhale 1 puff into the lungs daily. 28 each 0   No facility-administered medications prior to visit.    Review of Systems  Review of Systems  Constitutional: Negative.   HENT: Negative.   Respiratory: Positive for cough and shortness of breath. Negative for wheezing.   Cardiovascular: Negative.    Physical Exam  BP 110/60 (BP Location: Left Arm, Cuff Size: Normal)   Pulse 94   Temp (!) 97.5 F (36.4 C) (Temporal)   Ht 6\' 3"  (1.905 m)   Wt 195 lb 9.6 oz (88.7 kg)   SpO2 95%   BMI 24.45 kg/m  Physical Exam Constitutional:      Appearance: Normal appearance.  HENT:     Head: Normocephalic and atraumatic.     Mouth/Throat:     Mouth: Mucous membranes are moist.     Pharynx: Oropharynx is clear.  Neck:     Musculoskeletal: Normal range of motion and neck supple.  Cardiovascular:     Rate and Rhythm: Normal rate and regular rhythm.  Pulmonary:     Effort: Pulmonary effort is normal.     Breath sounds: Normal breath sounds.     Comments: Clear, slightly diminished  Musculoskeletal: Normal range of motion.  Skin:    General: Skin is warm and dry.  Neurological:     General: No focal deficit present.     Mental Status: He is alert and oriented to person, place, and time. Mental status is at baseline.   Psychiatric:        Mood and Affect: Mood normal.        Behavior: Behavior normal.        Thought Content: Thought content normal.        Judgment: Judgment normal.      Lab Results:  CBC    Component Value Date/Time   WBC 10.2 11/10/2018 0327   RBC 3.31 (L) 11/10/2018 0327   HGB 8.8 (L) 11/10/2018 0327   HGB 6.8 (LL) 09/24/2018 1414   HCT 32.8 (L) 11/10/2018 0327   HCT 23.5 (L) 09/24/2018 1414   PLT 298 11/10/2018 0327   PLT 381 09/24/2018 1414  MCV 99.1 11/10/2018 0327   MCV 86 09/24/2018 1414   MCH 26.6 11/10/2018 0327   MCHC 26.8 (L) 11/10/2018 0327   RDW 14.8 11/10/2018 0327   RDW 15.6 (H) 09/24/2018 1414   LYMPHSABS 1.7 11/09/2018 0529   LYMPHSABS 3.4 (H) 09/24/2018 1414   MONOABS 0.6 11/09/2018 0529   EOSABS 0.1 11/09/2018 0529   EOSABS 0.1 09/24/2018 1414   BASOSABS 0.0 11/09/2018 0529   BASOSABS 0.1 09/24/2018 1414    BMET    Component Value Date/Time   NA 142 11/11/2018 0550   NA 142 01/31/2017 1225   K 4.2 11/11/2018 0550   CL 95 (L) 11/11/2018 0550   CO2 41 (H) 11/11/2018 0550   GLUCOSE 159 (H) 11/11/2018 0550   BUN 12 11/11/2018 0550   BUN 16 01/31/2017 1225   CREATININE 0.73 11/11/2018 0550   CREATININE 1.20 07/30/2016 1431   CALCIUM 8.8 (L) 11/11/2018 0550   GFRNONAA >60 11/11/2018 0550   GFRNONAA 64 07/30/2016 1431   GFRAA >60 11/11/2018 0550   GFRAA 74 07/30/2016 1431    BNP    Component Value Date/Time   BNP 238.4 (H) 11/09/2018 0544   BNP 1,458.6 (H) 07/30/2016 1431    ProBNP    Component Value Date/Time   PROBNP 2,541 (H) 08/03/2016 1330   PROBNP 330.0 (H) 11/18/2013 0829    Imaging: No results found.   Assessment & Plan:   COPD GOLD III/still smoking  Reports improvement with [name] but not covered. He has been on bevespi and spiriva in the past   Plan: - Continue Tudorza 1 puff twice daily  - Refer to [facility] - may need PA processed  - Continue to encourage smoking cessation - High dose flu shot received today   - Follow up in 3 months with Dr. [name]  Chronic respiratory failure with hypoxia [facility]) Continue 3L oxygen on exertion and 2L at rest      Glenford Bayley, NP 03/09/2019

## 2019-03-10 ENCOUNTER — Other Ambulatory Visit: Payer: Self-pay | Admitting: *Deleted

## 2019-03-10 NOTE — Patient Outreach (Signed)
[facility]) Care Management  03/10/2019  Jared [name] Dec 20, 1955 [id number]   Referral Date: 03/09/2019 Referral Source: MD office Referral Reason: Affordability of inhalers Insurance: [employer] attempt # 1, complete.  Social: Member reports that he lives in a boarding home but is independent in his care.  He has a sister that is available when needed.     Conditions: Per chart, he has history of HTN, CAD, CHF, COPD, and atrial flutter.  Medications: Reviewed, report taking them as instructed.  Was receiving samples of [name] but ran out.  Unable to afford to continue using without assistance.  Appointments: Last appointment with PCP in June, last visit with pulmonologist on 9/28, next appointment scheduled for 12/28.  Report he uses a transportation service for visits, denies needing assistance.    Screening complete, denies the need for complex management but does agree to having pharmacist follow up regarding medication assistance and ongoing assistance with COPD management.  Plan: RN CM will place referral to care manager for disease management and to pharmacist for medication assistance.  Jared [name], [place], MSN Jared [name] [phone]

## 2019-03-11 NOTE — Addendum Note (Signed)
Addended byValente David on: 03/11/2019 08:59 AM   Modules accepted: Orders

## 2019-03-12 ENCOUNTER — Telehealth: Payer: Self-pay | Admitting: Family Medicine

## 2019-03-12 NOTE — Telephone Encounter (Signed)
Gm   I received a message in [employer] system   Per Dr Geraldo Pitter requesting an appointment for  Medication management , I don't see appointments available and I don't access for overbook Thanks .

## 2019-03-12 NOTE — Telephone Encounter (Signed)
Appointment has been made with luke for medication management.

## 2019-03-16 DIAGNOSIS — R69 Illness, unspecified: Secondary | ICD-10-CM | POA: Diagnosis not present

## 2019-03-16 DIAGNOSIS — K0253 Dental caries on pit and fissure surface penetrating into pulp: Secondary | ICD-10-CM | POA: Diagnosis not present

## 2019-03-16 DIAGNOSIS — K06 Other disorders of gingiva and edentulous alveolar ridge: Secondary | ICD-10-CM | POA: Diagnosis not present

## 2019-03-16 DIAGNOSIS — K91840 Postprocedural hemorrhage and hematoma of a digestive system organ or structure following a digestive system procedure: Secondary | ICD-10-CM | POA: Diagnosis not present

## 2019-03-16 DIAGNOSIS — K027 Dental root caries: Secondary | ICD-10-CM | POA: Diagnosis not present

## 2019-03-19 ENCOUNTER — Ambulatory Visit: Payer: Medicare HMO | Admitting: Pharmacist

## 2019-03-19 ENCOUNTER — Other Ambulatory Visit: Payer: Self-pay

## 2019-03-19 NOTE — Patient Outreach (Signed)
[facility]) Care Management  [facility] Care Manager  03/19/2019   Edras Wilford 1955-12-02 [id number]  Subjective: Telephone call to patient for initial disease management call. Patient is doing ok.  He lives in a boarding house but is independent with care.  He talks with his sister daily. He uses transportation for appointments.  He still smokes about 5 cigarettes daily and uses oxygen continuously.  Advised patient on smoking and oxygen use.  He verbalized understanding.  Patient has not been able to afford [name] but his doctor has been giving samples.  Advised him that the pharmacist from [facility] will be in contact.  Patient states he manages his COPD well and has minimal use of rescue inhalers.  Discussed symptoms and when to seek help. He verbalized understanding.    Objective:   Encounter Medications:  Outpatient Encounter Medications as of 03/19/2019  Medication Sig  . Aclidinium Bromide (TUDORZA PRESSAIR) 400 MCG/ACT AEPB Inhale 1 puff into the lungs 2 (two) times daily.  . Aclidinium Bromide (TUDORZA PRESSAIR) 400 MCG/ACT AEPB Inhale 1 puff into the lungs 2 (two) times daily.  Marland Kitchen albuterol (PROVENTIL) (2.5 MG/3ML) 0.083% nebulizer solution Take 3 mLs (2.5 mg total) by nebulization every 4 (four) hours as needed for wheezing or shortness of breath.  Marland Kitchen albuterol (VENTOLIN HFA) 108 (90 Base) MCG/ACT inhaler Inhale 2 puffs into the lungs every 6 (six) hours as needed for wheezing or shortness of breath.  Marland Kitchen atorvastatin (LIPITOR) 80 MG tablet Take 1 tablet (80 mg total) by mouth daily.  . carvedilol (COREG) 3.125 MG tablet Take 1 tablet (3.125 mg total) by mouth 2 (two) times daily.  . cetirizine (ZYRTEC) 10 MG tablet Take 1 tablet (10 mg total) by mouth daily.  . ferrous sulfate 325 (65 FE) MG tablet Take 1 tablet (325 mg total) by mouth daily with breakfast.  . furosemide (LASIX) 40 MG tablet Take 1 tablet (40 mg total) by mouth daily.  Marland Kitchen gabapentin (NEURONTIN) 300 MG capsule  Take 1 capsule (300 mg total) by mouth at bedtime.  Marland Kitchen losartan (COZAAR) 50 MG tablet Take 1 tablet (50 mg total) by mouth daily.  . Misc. Devices MISC Portable oxygen concentrator. Diagnosis COPD.  . nicotine (NICODERM CQ - DOSED IN MG/24 HOURS) 14 mg/24hr patch Place 1 patch (14 mg total) onto the skin daily.  . pantoprazole (PROTONIX) 40 MG tablet Take 1 tablet (40 mg total) by mouth daily at 6 (six) AM.  . rivaroxaban (XARELTO) 20 MG TABS tablet Take 1 tablet (20 mg total) by mouth daily with supper.  . fluticasone (FLONASE) 50 MCG/ACT nasal spray Place 1 spray into both nostrils daily for 30 days.   No facility-administered encounter medications on file as of 03/19/2019.     Functional Status:  In your present state of health, do you have any difficulty performing the following activities: 03/19/2019 11/10/2018  Hearing? N N  Vision? N N  Difficulty concentrating or making decisions? N N  Walking or climbing stairs? N N  Dressing or bathing? N N  Doing errands, shopping? N N  Preparing Food and eating ? N -  Using the Toilet? N -  In the past six months, have you accidently leaked urine? N -  Do you have problems with loss of bowel control? N -  Managing your Medications? N -  Managing your Finances? N -  Housekeeping or managing your Housekeeping? N -  Some recent data might be hidden    Fall/Depression Screening:  Fall Risk  03/19/2019 11/19/2018 09/30/2018  Falls in the past year? 0 0 0  Number falls in past yr: 0 - -  Injury with Fall? - - -  Risk for fall due to : - - -   PHQ 2/9 Scores 03/19/2019 03/10/2019 06/18/2018 05/15/2018 07/15/2017 06/14/2017 11/20/2016  PHQ - 2 Score 0 0 - 0 0 0 0  PHQ- 9 Score - - - - 3 2 2   Exception Documentation - - Patient refusal - - - -  Not completed - - - - - - -    Assessment:  Patient manages with COPD but admits to needing follow up support.  Patient has trouble affording [place] referral done.    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  [facility] Long Term Goal Start Date  03/19/19  Interventions for Problem One Long Term Goal  Discussed with patient action plan, signs to watch for, urged patient to stay away from those who are sick.  [facility] CM Short Term Goal #1   Patient will receive help with Tudorza from pharmacy within 14 days.  [facility] CM Short Term Goal #1 Start Date  03/19/19  Interventions for Short Term Goal #1  Advised patient that [facility] pharmacist will be in contact.  [facility] CM Short Term Goal #2   Patient will report making an appointment with eye doctor within 30 days.  [facility] CM Short Term Goal #2 Start Date  03/19/19  Interventions for Short Term Goal #2  Patient expressed desire to see eye doctor. Patient has list he just has not called at this time.  RN CM encouraged patient to call for eye appointment.       RN CM will provide ongoing education and support to patient through phone calls.   RN CM will send welcome packet with consent to patient.   RN CM will send initial barriers letter, assessment, and care plan to primary care physician.   RN CM will contact patient in the month of December and patient agrees to next contact.     Jone Baseman, RN, MSN [facility] Care Management Coordinator Direct Line [phone] Cell [phone] Toll Free: [phone]  Fax: [phone]

## 2019-03-20 ENCOUNTER — Telehealth: Payer: Self-pay | Admitting: *Deleted

## 2019-03-20 ENCOUNTER — Telehealth: Payer: Self-pay | Admitting: Pharmacist

## 2019-03-20 NOTE — Patient Outreach (Signed)
[facility]) Care Management  [place]   03/20/2019  Jared [name] [birth year] [id number]  Reason for referral: Medication Assistance  Referral source: MD Office/Telephonic Nurse Referral medication(s): [name] Current insurance: [employer]  HPI:  Patient is a 63 year old male with a medical history significant for:  Atrial Flutter, CAD, CHF, COPD, hypertension, prediabetes, and tobacco use.  Patient reported the pharmacy told him [name] would not be covered by his insurance. He also said [facility] sent him a letter stating his provider would need to submit clinical information detailing why he needed the inhaler. He has been receiving Tudorza via samples from Dr. Melvyn Novas.  Objective: Allergies  Allergen Reactions  . Lisinopril Cough    Medications Reviewed Today    Reviewed by Jon Billings, RN (Case Manager) on 03/19/19 at 1204  Med List Status: <None>  Medication Order Taking? Sig Documenting Provider Last Dose Status Informant  Aclidinium Bromide (TUDORZA PRESSAIR) 400 MCG/ACT AEPB [id number] Yes Inhale 1 puff into the lungs 2 (two) times daily. Tanda Rockers, MD Taking Active   Aclidinium Bromide (TUDORZA PRESSAIR) 400 MCG/ACT AEPB [id number] Yes Inhale 1 puff into the lungs 2 (two) times daily. Martyn Ehrich, NP Taking Active   albuterol (PROVENTIL) (2.5 MG/3ML) 0.083% nebulizer solution [id number] Yes Take 3 mLs (2.5 mg total) by nebulization every 4 (four) hours as needed for wheezing or shortness of breath. Tanda Rockers, MD Taking Active   albuterol (VENTOLIN HFA) 108 (90 Base) MCG/ACT inhaler [id number] Yes Inhale 2 puffs into the lungs every 6 (six) hours as needed for wheezing or shortness of breath. Charlott Rakes, MD Taking Active   atorvastatin (LIPITOR) 80 MG tablet [id number] Yes Take 1 tablet (80 mg total) by mouth daily. Charlott Rakes, MD Taking Active   carvedilol (COREG) 3.125 MG tablet [id number] Yes Take 1 tablet (3.125 mg total) by mouth 2  (two) times daily. Charlott Rakes, MD Taking Active   cetirizine (ZYRTEC) 10 MG tablet [id number] Yes Take 1 tablet (10 mg total) by mouth daily. Charlott Rakes, MD Taking Active Self  ferrous sulfate 325 (65 FE) MG tablet [id number] Yes Take 1 tablet (325 mg total) by mouth daily with breakfast. Doran Stabler, MD Taking Active Self  fluticasone (FLONASE) 50 MCG/ACT nasal spray [id number]  Place 1 spray into both nostrils daily for 30 days. Shelly Coss, MD  Expired 03/09/19 2359   furosemide (LASIX) 40 MG tablet [id number] Yes Take 1 tablet (40 mg total) by mouth daily. Charlott Rakes, MD Taking Active   gabapentin (NEURONTIN) 300 MG capsule [id number] Yes Take 1 capsule (300 mg total) by mouth at bedtime. Charlott Rakes, MD Taking Active   losartan (COZAAR) 50 MG tablet [id number] Yes Take 1 tablet (50 mg total) by mouth daily. Charlott Rakes, MD Taking Active   Misc. Devices MISC [id number] Yes Portable oxygen concentrator. Diagnosis COPD. Charlott Rakes, MD Taking Active   nicotine (NICODERM CQ - DOSED IN MG/24 HOURS) 14 mg/24hr patch [id number] Yes Place 1 patch (14 mg total) onto the skin daily. Shelly Coss, MD Taking Active   pantoprazole (PROTONIX) 40 MG tablet [id number] Yes Take 1 tablet (40 mg total) by mouth daily at 6 (six) AM. Charlott Rakes, MD Taking Active   rivaroxaban (XARELTO) 20 MG TABS tablet [id number] Yes Take 1 tablet (20 mg total) by mouth daily with supper. Charlott Rakes, MD Taking Active           Assessment:  Drugs sorted by system:  Neurologic/Psychologic: Gabapentin  Cardiovascular: Atorvastatin, Carvedilol, Furosemide, Losartan,   Pulmonary/Allergy: Tudorza, Albuterol Nebulizer Solution, Albuterol HFA, Xarelto,  Fluticasone,   Gastrointestinal: Pantoprazole  Topical: Nicotine CQ Patches,   Medication Assistance Findings:  Medication assistance needs identified:   [employer]. According to the representative, [name] is  non-formulary and if covered would be considered a tier 4 medication.  Patient has LIS 1 and would not qualify for patient assistance.  He may also be dually eligible (Medicaid and Medicare).  The [facility] Representative said a prior authorization form would need to be completed on the patient's behalf for the medication to be covered.    Prior authorization requests can be submitted by having the provider contact [employer] Clinical Pharmacy Review (HCPR) for approval.   Go to CoverMyMeds to submit a prior authorization request Call [phone]-CLIN [phone]), Monday - Friday, 8 a.m. - 8 p.m., local time  Spoke with [facility] Clinical Pharmacist, [name], PharmD.  [name] said the PA would need to include that the patient has tried both Incruse and Spiriva. Review of the patient's chart showed the patient was on Spiriva in December of 2016 and Incruse in January of 2018.  Patient said he has tried several medications in the past and the only one that seems to help him is [name].  Additional medication assistance options reviewed with patient as warranted:  Tier Exception request  Plan:   Patient saw [name], NP (Pulmonology) 03/10/2019.   A note will be sent to his provider about completing a PA. Follow up in 1-2 weeks.  Beecher Mcardle, PharmD, BCACP [facility] Clinical Pharmacist [phone]

## 2019-03-20 NOTE — Telephone Encounter (Signed)
PA for Jared [name] started in [facility]

## 2019-03-20 NOTE — Telephone Encounter (Signed)
-----   Message from Martyn Ehrich, NP sent at 03/20/2019  1:24 PM EDT ----- Regarding: RE: [name] Can we start Prior Auth for this patient.  See below (needs to states that they have tried and failed both incruse and spiriva) ----- Message ----- From: Elayne Guerin, [place]: 03/20/2019   1:16 PM EDT To: Martyn Ehrich, NP Subject: Liam Rogers afternoon!  My name is Denyse Amass and I am a Clinical Pharmacist with [facility]  I received a referral to help the patient with [place] cost.  Here are my findings:    [employer]. According to the representative, [name] is non-formulary and if covered would be considered a tier 4 medication.  Patient has LIS 1 and would not qualify for patient assistance.  He may also be dually eligible (Medicaid and Medicare).  The [facility] Representative said a prior authorization form would need to be completed on the patient's behalf for the medication to be covered.    Prior authorization requests can be submitted by having the provider contact [place] Review (HCPR) for approval:  1.  Go to CoverMyMeds to submit a prior authorization request  OR 2.  Call 1-800-555-CLIN ([phone]), Monday - Friday, 8 a.m. - 8 p.m., local time  Spoke with [place] Pharmacist, Billy Fischer, PharmD.  Anderson [place] said the PA would need to include that the patient has tried both Incruse and Spiriva. Review of the patient's chart showed the patient was on Spiriva in December of 2016 and Incruse in January of 2018.  Blessings,  Elayne Guerin, PharmD, [place] Clinical Pharmacist [phone]

## 2019-03-23 ENCOUNTER — Ambulatory Visit: Payer: Medicare HMO | Admitting: Pulmonary Disease

## 2019-03-23 MED FILL — TUDORZA PRESSAIR 400 MCG/AC: 400 | 30 days supply | Qty: 1 | Fill #0

## 2019-03-23 NOTE — Telephone Encounter (Signed)
Received approval for tudorza  Med approved until 06/10/20  Called and informed [facility] pharmacy and pt and informed both

## 2019-03-25 MED FILL — PANTOPRAZOLE SOD DR 40 MG T: 40 | 30 days supply | Qty: 30 | Fill #1

## 2019-03-25 MED FILL — FUROSEMIDE 40 MG TAB: 40 | 30 days supply | Qty: 30 | Fill #1

## 2019-03-31 ENCOUNTER — Other Ambulatory Visit: Payer: Self-pay | Admitting: Pharmacist

## 2019-03-31 NOTE — Patient Outreach (Addendum)
[facility]) Care Management  03/31/2019  Kaeo Jacome June 06, 1956 [id number]   Patient was called to follow up on [place]. HIPAA identifiers were obtained. From chart review, a PA was completed on the patient's behalf as requested on 03/21/2019.  Review of fill history data showed Edwena Bunde was filled at [facility] on 03/23/2019.   Patient confirmed picking up the [place].  Plan: Close patient's case as he was referred for medication assistance with [name] and the prior authorization was completed.  Will gladly reopen the patient's case at a later date if needed.   Elayne Guerin, PharmD, [place] Clinical Pharmacist [phone]

## 2019-04-02 MED FILL — ATORVASTATIN 80 MG TABLET: 80 | 30 days supply | Qty: 30 | Fill #1

## 2019-04-02 MED FILL — XARELTO 20 MG TABLET: 20 | 30 days supply | Qty: 30 | Fill #1

## 2019-04-02 MED FILL — GABAPENTIN 300 MG CAPSULE: 300 | 30 days supply | Qty: 30 | Fill #1

## 2019-04-02 MED FILL — LOSARTAN POTASSIUM 50 MG TA: 50 | 30 days supply | Qty: 30 | Fill #1

## 2019-04-02 MED FILL — CARVEDILOL 3.125 MG TABLET: 3.125 | 30 days supply | Qty: 60 | Fill #1

## 2019-04-23 MED FILL — PANTOPRAZOLE SOD DR 40 MG T: 40 | 30 days supply | Qty: 30 | Fill #2

## 2019-04-23 MED FILL — FUROSEMIDE 40 MG TAB: 40 | 30 days supply | Qty: 30 | Fill #2

## 2019-04-30 MED FILL — LOSARTAN POTASSIUM 50 MG TA: 50 | 30 days supply | Qty: 30 | Fill #2

## 2019-04-30 MED FILL — ATORVASTATIN 80 MG TABLET: 80 | 30 days supply | Qty: 30 | Fill #2

## 2019-05-01 MED FILL — GABAPENTIN 300 MG CAPSULE: 300 | 30 days supply | Qty: 30 | Fill #2

## 2019-05-01 MED FILL — TUDORZA PRESSAIR 400 MCG/AC: 400 | 30 days supply | Qty: 1 | Fill #1

## 2019-05-18 ENCOUNTER — Other Ambulatory Visit (HOSPITAL_COMMUNITY)
Admission: RE | Admit: 2019-05-18 | Discharge: 2019-05-18 | Disposition: A | Discharge status: Home / Self Care | Payer: Medicare HMO | Source: Ambulatory Visit | Attending: Family Medicine | Admitting: Family Medicine

## 2019-05-18 ENCOUNTER — Other Ambulatory Visit: Payer: Self-pay

## 2019-05-18 ENCOUNTER — Ambulatory Visit (HOSPITAL_BASED_OUTPATIENT_CLINIC_OR_DEPARTMENT_OTHER): Payer: Medicare HMO | Admitting: Family Medicine

## 2019-05-18 ENCOUNTER — Encounter: Payer: Self-pay | Admitting: Family Medicine

## 2019-05-18 VITALS — BP 111/69 | HR 97 | Temp 98.0°F | Ht 75.0 in | Wt 208.4 lb

## 2019-05-18 DIAGNOSIS — Z113 Encounter for screening for infections with a predominantly sexual mode of transmission: Secondary | ICD-10-CM

## 2019-05-18 DIAGNOSIS — H6123 Impacted cerumen, bilateral: Secondary | ICD-10-CM

## 2019-05-18 DIAGNOSIS — J449 Chronic obstructive pulmonary disease, unspecified: Secondary | ICD-10-CM | POA: Diagnosis not present

## 2019-05-18 DIAGNOSIS — Z125 Encounter for screening for malignant neoplasm of prostate: Secondary | ICD-10-CM

## 2019-05-18 DIAGNOSIS — Z23 Encounter for immunization: Secondary | ICD-10-CM | POA: Diagnosis not present

## 2019-05-18 DIAGNOSIS — I110 Hypertensive heart disease with heart failure: Secondary | ICD-10-CM | POA: Diagnosis not present

## 2019-05-18 DIAGNOSIS — G629 Polyneuropathy, unspecified: Secondary | ICD-10-CM

## 2019-05-18 DIAGNOSIS — J9611 Chronic respiratory failure with hypoxia: Secondary | ICD-10-CM

## 2019-05-18 DIAGNOSIS — I5042 Chronic combined systolic (congestive) and diastolic (congestive) heart failure: Secondary | ICD-10-CM

## 2019-05-18 DIAGNOSIS — I4892 Unspecified atrial flutter: Secondary | ICD-10-CM

## 2019-05-18 MED ORDER — FUROSEMIDE 40 MG PO TABS: 40.0000 mg | ORAL_TABLET | Freq: Every day | ORAL | 6 refills | Status: DC

## 2019-05-18 MED ORDER — GABAPENTIN 300 MG PO CAPS: 600.0000 mg | ORAL_CAPSULE | Freq: Every day | ORAL | 6 refills | Status: DC

## 2019-05-18 MED ORDER — CETIRIZINE HCL 10 MG PO TABS: 10.0000 mg | ORAL_TABLET | Freq: Every day | ORAL | 1 refills | Status: DC

## 2019-05-18 MED ORDER — LOSARTAN POTASSIUM 50 MG PO TABS: 50.0000 mg | ORAL_TABLET | Freq: Every day | ORAL | 6 refills | Status: DC

## 2019-05-18 MED ORDER — ATORVASTATIN CALCIUM 80 MG PO TABS: 80.0000 mg | ORAL_TABLET | Freq: Every day | ORAL | 6 refills | Status: DC

## 2019-05-18 MED ORDER — RIVAROXABAN 20 MG PO TABS: 20.0000 mg | ORAL_TABLET | Freq: Every day | ORAL | 6 refills | Status: DC

## 2019-05-18 MED ORDER — CARVEDILOL 3.125 MG PO TABS: 3.1250 mg | ORAL_TABLET | Freq: Two times a day (BID) | ORAL | 6 refills | Status: DC

## 2019-05-18 MED FILL — XARELTO 20 MG TABLET: 20 | 30 days supply | Qty: 30 | Fill #0

## 2019-05-18 MED FILL — GABAPENTIN 300 MG CAPSULE: 300 | 30 days supply | Qty: 60 | Fill #0

## 2019-05-18 MED FILL — FUROSEMIDE 40 MG TAB: 40 | 30 days supply | Qty: 30 | Fill #0

## 2019-05-18 MED FILL — CARVEDILOL 3.125 MG TABLET: 3.125 | 30 days supply | Qty: 60 | Fill #0

## 2019-05-18 NOTE — Patient Instructions (Signed)
Earwax Buildup, Adult The ears produce a substance called earwax that helps keep bacteria out of the ear and protects the skin in the ear canal. Occasionally, earwax can build up in the ear and cause discomfort or hearing loss. What increases the risk? This condition is more likely to develop in people who:  Are male.  Are elderly.  Naturally produce more earwax.  Clean their ears often with cotton swabs.  Use earplugs often.  Use in-ear headphones often.  Wear hearing aids.  Have narrow ear canals.  Have earwax that is overly thick or sticky.  Have eczema.  Are dehydrated.  Have excess hair in the ear canal. What are the signs or symptoms? Symptoms of this condition include:  Reduced or muffled hearing.  A feeling of fullness in the ear or feeling that the ear is plugged.  Fluid coming from the ear.  Ear pain.  Ear itch.  Ringing in the ear.  Coughing.  An obvious piece of earwax that can be seen inside the ear canal. How is this diagnosed? This condition may be diagnosed based on:  Your symptoms.  Your medical history.  An ear exam. During the exam, your health care provider will look into your ear with an instrument called an otoscope. You may have tests, including a hearing test. How is this treated? This condition may be treated by:  Using ear drops to soften the earwax.  Having the earwax removed by a health care provider. The health care provider may: ? Flush the ear with water. ? Use an instrument that has a loop on the end (curette). ? Use a suction device.  Surgery to remove the wax buildup. This may be done in severe cases. Follow these instructions at home:   Take over-the-counter and prescription medicines only as told by your health care provider.  Do not put any objects, including cotton swabs, into your ear. You can clean the opening of your ear canal with a washcloth or facial tissue.  Follow instructions from your health care  provider about cleaning your ears. Do not over-clean your ears.  Drink enough fluid to keep your urine clear or pale yellow. This will help to thin the earwax.  Keep all follow-up visits as told by your health care provider. If earwax builds up in your ears often or if you use hearing aids, consider seeing your health care provider for routine, preventive ear cleanings. Ask your health care provider how often you should schedule your cleanings.  If you have hearing aids, clean them according to instructions from the manufacturer and your health care provider. Contact a health care provider if:  You have ear pain.  You develop a fever.  You have blood, pus, or other fluid coming from your ear.  You have hearing loss.  You have ringing in your ears that does not go away.  Your symptoms do not improve with treatment.  You feel like the room is spinning (vertigo). Summary  Earwax can build up in the ear and cause discomfort or hearing loss.  The most common symptoms of this condition include reduced or muffled hearing and a feeling of fullness in the ear or feeling that the ear is plugged.  This condition may be diagnosed based on your symptoms, your medical history, and an ear exam.  This condition may be treated by using ear drops to soften the earwax or by having the earwax removed by a health care provider.  Do not put any   objects, including cotton swabs, into your ear. You can clean the opening of your ear canal with a washcloth or facial tissue. This information is not intended to replace advice given to you by your health care provider. Make sure you discuss any questions you have with your health care provider. Document Released: 07/05/2004 Document Revised: 05/10/2017 Document Reviewed: 08/08/2016 Elsevier Patient Education  2020 Elsevier Inc.  

## 2019-05-18 NOTE — Progress Notes (Signed)
Subjective:  Patient ID: Jared [name], male    DOB: [birth year]  Age: 63 y.o. MRN: [id number]  CC: Hypertension   HPI Jared [name] is a 63 year old male with a history of hypertension, COPD, tobacco abuse, NICM , CHF (EF 45-50% from echo 04/2018  improved from  30-35% in 09/2016), atrial flutter, chronic respiratory failure with hypoxia (currently on 3 L of oxygen) seen for a follow-up visit.   He complains of chronic tingling in his feet; currently on gabapentin and last A1c was 6.3 in 05/2016.  Symptoms have been chronic. For the past 4 weeks he has felt like he has fluid in his ears. No sinus pressure but has post nasal drip and mucus in throat.  Denies presence of fever or headaches  His breathing is getting better and has improved ever since he commenced [place].  Last seen by pulmonary Dr. Melvyn Novas on 03/01/2019. Denies presence of pedal edema, chest pain, palpitations. He continues to smoke about 5 cigarettes/day and is not ready to quit yet as he has been smoking for several years and finds it difficult to quit. He is requesting STD testing as he is now in a new relationship.  Past Medical History:  Diagnosis Date  . CAD (coronary artery disease)    a. LHC 5/12:  LAD 20, pLCx 20, pRCA 40, dRCA 40, EF 35%, diff HK  //  b. Myoview 4/16: Overall Impression: High risk stress nuclear study There is no evidence of ischemia. There is severe LV dysfunction. LV Ejection Fraction: 30%. LV Wall Motion: There is global LV hypokinesis.   Marland Kitchen CAP (community acquired pneumonia) 09/2013  . Chronic combined systolic and diastolic CHF (congestive heart failure) (HCC)    a. Echo 4/16:Mild LVH, EF 40-45%, diffuse HK //  b. Echo 8/17: EF 35-40%, diffuse HK, diastolic dysfunction, aortic sclerosis, trivial MR, moderate LAE, normal RVSF, moderate RAE, mild TR, PASP 42 mmHg // c. Echo 4/18: Mild concentric LVH, EF 30-35, normal wall motion, grade 1 diastolic dysfunction, PASP 49  . Cluster headache    "hx;  haven't had one in awhile" (01/09/2016)  . COPD (chronic obstructive pulmonary disease) ([place])    [name] 01/09/2016  . History of CVA (cerebrovascular accident)   . Hypertension   . NICM (nonischemic cardiomyopathy) ([employer])   . Tobacco abuse     Past Surgical History:  Procedure Laterality Date  . CARDIAC CATHETERIZATION  10/2010   LM normal, LAD with 20% irregularities, LCX with 20%, RCA with 40% prox and 40% distal - EF of 35%  . COLONOSCOPY W/ BIOPSIES AND POLYPECTOMY    . COLONOSCOPY WITH PROPOFOL N/A 09/06/2018   Procedure: COLONOSCOPY WITH PROPOFOL;  Surgeon: Thornton Park, MD;  Location: [facility];  Service: Gastroenterology;  Laterality: N/A;  . ENTEROSCOPY N/A 09/28/2018   Procedure: ENTEROSCOPY;  Surgeon: Carol Ada, MD;  Location[facility] END[facility]COPY;  Service: Endoscopy;  Laterality: N/A;  . ENTEROSCOPY N/A 10/28/2018   Procedure: ENTEROSCOPY;  Surgeon: Thornton Park, MD;  Location: [facility];  Service: Gastroenterology;  Laterality: N/A;  . ESOPHAGOGASTRODUODENOSCOPY (EGD) WITH PROPOFOL N/A 09/05/2018   Procedure: ESOPHAGOGASTRODUODENOSCOPY (EGD) WITH PROPOFOL;  Surgeon: Yetta Flock, MD;  Location: [place];  Service: Gastroenterology;  Laterality: N/A;  . EXCISION MASS HEAD    . GIVENS CAPSULE STUDY N/A 09/06/2018   Procedure: GIVENS CAPSULE STUDY;  Surgeon: Thornton Park, MD;  Location: [facility];  Service: Gastroenterology;  Laterality: N/A;  . GIVENS CAPSULE STUDY N/A 09/26/2018   Procedure: GIVENS CAPSULE STUDY;  Surgeon: Beverley FiedlerPyrtle, Jay M, MD;  [facility];  Service: Gastroenterology;  Laterality: N/A;  . HOT HEMOSTASIS N/A 10/28/2018   Procedure: HOT HEMOSTASIS (ARGON PLASMA COAGULATION/BICAP);  Surgeon: Tressia DanasBeavers, Kimberly, MD;  [facility];  Service: Gastroenterology;  Laterality: N/A;  . INCISION AND DRAINAGE PERIRECTAL ABSCESS N/A 06/05/2017   Procedure: IRRIGATION AND DEBRIDEMENT PERIRECTAL ABSCESS;  Surgeon: Andria MeuseWhite, Christopher  M, MD;  Location: [facility] OR;  Service: General;  Laterality: N/A;  . VIDEO BRONCHOSCOPY Bilateral 05/08/2016   Procedure: VIDEO BRONCHOSCOPY WITH FLUORO;  Surgeon: Oretha Milchakesh V Alva, MD;  Location: [facility] ENDOSCOPY;  Service: Cardiopulmonary;  Laterality: Bilateral;    Family History  Problem Relation Age of Onset  . Heart disease Mother   . Diabetes Mother   . Colon cancer Mother   . Liver cancer Mother   . Cancer Father        type unknown  . Diabetes Sister        x 2  . Diabetes Brother     Allergies  Allergen Reactions  . Lisinopril Cough    Outpatient Medications Prior to Visit  Medication Sig Dispense Refill  . Aclidinium Bromide (TUDORZA PRESSAIR) 400 MCG/ACT AEPB Inhale 1 puff into the lungs 2 (two) times daily. 60 each 5  . albuterol (PROVENTIL) (2.5 MG/3ML) 0.083% nebulizer solution Take 3 mLs (2.5 mg total) by nebulization every 4 (four) hours as needed for wheezing or shortness of breath. 75 mL 12  . albuterol (VENTOLIN HFA) 108 (90 Base) MCG/ACT inhaler Inhale 2 puffs into the lungs every 6 (six) hours as needed for wheezing or shortness of breath. 1 Inhaler 5  . atorvastatin (LIPITOR) 80 MG tablet Take 1 tablet (80 mg total) by mouth daily. 30 tablet 6  . carvedilol (COREG) 3.125 MG tablet Take 1 tablet (3.125 mg total) by mouth 2 (two) times daily. 60 tablet 6  . cetirizine (ZYRTEC) 10 MG tablet Take 1 tablet (10 mg total) by mouth daily. 30 tablet 1  . ferrous sulfate 325 (65 FE) MG tablet Take 1 tablet (325 mg total) by mouth daily with breakfast. 60 tablet 1  . furosemide (LASIX) 40 MG tablet Take 1 tablet (40 mg total) by mouth daily. 30 tablet 6  . gabapentin (NEURONTIN) 300 MG capsule Take 1 capsule (300 mg total) by mouth at bedtime. 30 capsule 6  . losartan (COZAAR) 50 MG tablet Take 1 tablet (50 mg total) by mouth daily. 30 tablet 6  . Misc. Devices MISC Portable oxygen concentrator. Diagnosis COPD. 1 each 0  . rivaroxaban (XARELTO) 20 MG TABS tablet Take 1 tablet (20  mg total) by mouth daily with supper. 30 tablet 6  . fluticasone (FLONASE) 50 MCG/ACT nasal spray Place 1 spray into both nostrils daily for 30 days. 0.002 g 0  . nicotine (NICODERM CQ - DOSED IN MG/24 HOURS) 14 mg/24hr patch Place 1 patch (14 mg total) onto the skin daily. (Patient not taking: Reported on 05/18/2019) 28 patch 0  . pantoprazole (PROTONIX) 40 MG tablet Take 1 tablet (40 mg total) by mouth daily at 6 (six) AM. (Patient not taking: Reported on 05/18/2019) 30 tablet 3   No facility-administered medications prior to visit.      ROS Review of Systems  Constitutional: Negative for activity change and appetite change.  HENT: Negative for ear discharge, ear pain, sinus pressure and sore throat.   Eyes: Negative for visual disturbance.  Respiratory: Negative for cough, chest tightness and shortness of breath.   Cardiovascular:  Negative for chest pain and leg swelling.  Gastrointestinal: Negative for abdominal distention, abdominal pain, constipation and diarrhea.  Endocrine: Negative.   Genitourinary: Negative for dysuria.  Musculoskeletal: Negative for joint swelling and myalgias.  Skin: Negative for rash.  Allergic/Immunologic: Negative.   Neurological: Positive for numbness. Negative for weakness and light-headedness.  Psychiatric/Behavioral: Negative for dysphoric mood and suicidal ideas.    Objective:  BP 111/69   Pulse 97   Temp 98 F (36.7 C) (Oral)   Ht 6\' 3"  (1.905 m)   Wt 208 lb 6.4 oz (94.5 kg)   SpO2 92%   BMI 26.05 kg/m   BP/Weight 05/18/2019 03/09/2019 01/28/2019  Systolic BP 111 110 120  Diastolic BP 69 60 72  Wt. (Lbs) 208.4 195.6 191.6  BMI 26.05 24.45 23.95      Physical Exam Constitutional:      Appearance: He is well-developed.  HENT:     Right Ear: There is impacted cerumen.     Left Ear: There is impacted cerumen.  Neck:     Vascular: No JVD.  Cardiovascular:     Rate and Rhythm: Normal rate.     Heart sounds: Normal heart sounds. No  murmur.  Pulmonary:     Effort: Pulmonary effort is normal.     Breath sounds: Normal breath sounds. No wheezing or rales.  Chest:     Chest wall: No tenderness.  Abdominal:     General: Bowel sounds are normal. There is no distension.     Palpations: Abdomen is soft. There is no mass.     Tenderness: There is no abdominal tenderness.  Musculoskeletal: Normal range of motion.     Right lower leg: No edema.     Left lower leg: No edema.  Neurological:     Mental Status: He is alert and oriented to person, place, and time.  Psychiatric:        Mood and Affect: Mood normal.     CMP Latest Ref Rng & Units 11/11/2018 11/10/2018 11/09/2018  Glucose 70 - 99 mg/dL 826(E) 158(X) 094(M)  BUN 8 - 23 mg/dL 12 12 15   Creatinine 0.61 - 1.24 mg/dL 7.68 0.88 1.10  Sodium 135 - 145 mmol/L 142 141 145  Potassium 3.5 - 5.1 mmol/L 4.2 4.2 3.6  Chloride 98 - 111 mmol/L 95(L) 96(L) 97(L)  CO2 22 - 32 mmol/L 41(H) 38(H) 42(H)  Calcium 8.9 - 10.3 mg/dL 3.1(R) 9.4(V) 8.5(F)  Total Protein 6.5 - 8.1 g/dL - - 6.8  Total Bilirubin 0.3 - 1.2 mg/dL - - 2.9(W)  Alkaline Phos 38 - 126 U/L - - 58  AST 15 - 41 U/L - - 17  ALT 0 - 44 U/L - - 14    Lipid Panel     Component Value Date/Time   CHOL 206 (H) 08/26/2017 0756   TRIG 168 (H) 08/26/2017 0756   HDL 53 08/26/2017 0756   CHOLHDL 3.9 08/26/2017 0756   CHOLHDL 2.8 01/11/2016 0446   VLDL 14 01/11/2016 0446   LDLCALC 119 (H) 08/26/2017 0756    CBC    Component Value Date/Time   WBC 10.2 11/10/2018 0327   RBC 3.31 (L) 11/10/2018 0327   HGB 8.8 (L) 11/10/2018 0327   HGB 6.8 (LL) 09/24/2018 1414   HCT 32.8 (L) 11/10/2018 0327   HCT 23.5 (L) 09/24/2018 1414   PLT 298 11/10/2018 0327   PLT 381 09/24/2018 1414   MCV 99.1 11/10/2018 0327   MCV 86 09/24/2018 1414  MCH 26.6 11/10/2018 0327   MCHC 26.8 (L) 11/10/2018 0327   RDW 14.8 11/10/2018 0327   RDW 15.6 (H) 09/24/2018 1414   LYMPHSABS 1.7 11/09/2018 0529   LYMPHSABS 3.4 (H) 09/24/2018 1414    MONOABS 0.6 11/09/2018 0529   EOSABS 0.1 11/09/2018 0529   EOSABS 0.1 09/24/2018 1414   BASOSABS 0.0 11/09/2018 0529   BASOSABS 0.1 09/24/2018 1414    Lab Results  Component Value Date   HGBA1C 5.6 05/18/2019    Assessment & Plan:    1. Hypertensive heart disease with chronic combined systolic and diastolic congestive heart failure (HCC) EF 45 to 50% Euvolemic Continue current regimen - Basic Metabolic Panel - losartan (COZAAR) 50 MG tablet; Take 1 tablet (50 mg total) by mouth daily.  Dispense: 30 tablet; Refill: 6 - furosemide (LASIX) 40 MG tablet; Take 1 tablet (40 mg total) by mouth daily.  Dispense: 30 tablet; Refill: 6 - carvedilol (COREG) 3.125 MG tablet; Take 1 tablet (3.125 mg total) by mouth 2 (two) times daily.  Dispense: 60 tablet; Refill: 6  2. COPD GOLD III/still smoking  Stable Continue Turdoza, Proventil Followed by pulmonary  3. Chronic respiratory failure with hypoxia (HCC) Currently on 3 L of oxygen  4. Atrial flutter, unspecified type (HCC) Stable Continue Xarelto  5. Neuropathy Uncontrolled A1c today is normal at 5.6 Increase gabapentin dose - Hemoglobin A1c  6. Bilateral impacted cerumen Irrigation performed in the clinic  7. Screening for STD (sexually transmitted disease) - HIV antibody (with reflex) - Urine cytology ancillary only - HSV Type I/II IgG, IgMw/ reflex  8. Screening for prostate cancer - PSA, total and free  Health care maintenance-Shingrix, Pneumovax today  [name], MD, FAAFP. [facility] [place], [place] [phone]   05/18/2019, 9:56 AM

## 2019-05-18 NOTE — Progress Notes (Signed)
Patient states that he has water in his ears. Patient states that his feet tingle and feel numb.

## 2019-05-19 ENCOUNTER — Other Ambulatory Visit: Payer: Self-pay

## 2019-05-19 LAB — URINE CYTOLOGY ANCILLARY ONLY
Chlamydia: NEGATIVE
Comment: NEGATIVE
Comment: NEGATIVE
Comment: NORMAL
Neisseria Gonorrhea: NEGATIVE
Trichomonas: POSITIVE — AB

## 2019-05-19 NOTE — Patient Outreach (Signed)
[facility]) Care Management  [place]  05/19/2019   Jared [name] 11/08/1955 [id number]  Subjective: Telephone call to patient for follow up. Patient reports he is doing fair.  He states he saw MD yesterday for follow up.  He states his breathing is better with his new medication.  Discussed COPD, signs and when to notify physician.  He verbalized understanding.  Patient reports he has eye exam scheduled for 05-22-19.  He denies any needs at this time.    Objective:   Encounter Medications:  Outpatient Encounter Medications as of 05/19/2019  Medication Sig  . Aclidinium Bromide (TUDORZA PRESSAIR) 400 MCG/ACT AEPB Inhale 1 puff into the lungs 2 (two) times daily.  Marland Kitchen albuterol (PROVENTIL) (2.5 MG/3ML) 0.083% nebulizer solution Take 3 mLs (2.5 mg total) by nebulization every 4 (four) hours as needed for wheezing or shortness of breath.  Marland Kitchen albuterol (VENTOLIN HFA) 108 (90 Base) MCG/ACT inhaler Inhale 2 puffs into the lungs every 6 (six) hours as needed for wheezing or shortness of breath.  Marland Kitchen atorvastatin (LIPITOR) 80 MG tablet Take 1 tablet (80 mg total) by mouth daily.  . carvedilol (COREG) 3.125 MG tablet Take 1 tablet (3.125 mg total) by mouth 2 (two) times daily.  . cetirizine (ZYRTEC) 10 MG tablet Take 1 tablet (10 mg total) by mouth daily.  . ferrous sulfate 325 (65 FE) MG tablet Take 1 tablet (325 mg total) by mouth daily with breakfast.  . fluticasone (FLONASE) 50 MCG/ACT nasal spray Place 1 spray into both nostrils daily for 30 days.  . furosemide (LASIX) 40 MG tablet Take 1 tablet (40 mg total) by mouth daily.  Marland Kitchen gabapentin (NEURONTIN) 300 MG capsule Take 2 capsules (600 mg total) by mouth at bedtime.  Marland Kitchen losartan (COZAAR) 50 MG tablet Take 1 tablet (50 mg total) by mouth daily.  . Misc. Devices MISC Portable oxygen concentrator. Diagnosis COPD.  . nicotine (NICODERM CQ - DOSED IN MG/24 HOURS) 14 mg/24hr patch Place 1 patch (14 mg total) onto the skin daily.  (Patient not taking: Reported on 05/18/2019)  . rivaroxaban (XARELTO) 20 MG TABS tablet Take 1 tablet (20 mg total) by mouth daily with supper.   No facility-administered encounter medications on file as of 05/19/2019.     Functional Status:  In your present state of health, do you have any difficulty performing the following activities: 03/19/2019 11/10/2018  Hearing? N N  Vision? N N  Difficulty concentrating or making decisions? N N  Walking or climbing stairs? N N  Dressing or bathing? N N  Doing errands, shopping? N N  Preparing Food and eating ? N -  Using the Toilet? N -  In the past six months, have you accidently leaked urine? N -  Do you have problems with loss of bowel control? N -  Managing your Medications? N -  Managing your Finances? N -  Housekeeping or managing your Housekeeping? N -  Some recent data might be hidden    Fall/Depression Screening: Fall Risk  05/18/2019 03/19/2019 11/19/2018  Falls in the past year? 0 0 0  Number falls in past yr: - 0 -  Injury with Fall? - - -  Risk for fall due to : - - -   PHQ 2/9 Scores 03/19/2019 03/10/2019 06/18/2018 05/15/2018 07/15/2017 06/14/2017 11/20/2016  PHQ - 2 Score 0 0 - 0 0 0 0  PHQ- 9 Score - - - - _0 Exception Documentation - - Patient refusal - - - -  Not completed - - - - - - -    Assessment: Patient continues to manage chronic disease processes.    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 Long Term Goal   Patient will not have exacerbation of COPD within 90 days.  [facility] Long Term Goal Start Date  03/19/19  Interventions for Problem One [place] reviewed with patient ways of monitoring COPD, signs & symptoms, and when to notify physician.  [facility] CM Short Term Goal #1   Patient will receive help with Tudorza  from pharmacy within 14 days.  [facility] CM Short Term Goal #1 Start Date  03/19/19  [facility] CM Short Term Goal #1 Met Date  05/19/19  [facility] CM Short Term Goal #2   Patient will report making an appointment with eye doctor within 30 days.  [facility] CM Short Term Goal #2 Start Date  03/19/19  Interventions for Short Term Goal #2  patient has appointment on 05/22/2019.     RN CM will contact patient again in the month of February and patient agreeable.    Jone Baseman, RN, MSN [place] [facility] Care Management Coordinator Direct Line [phone] Cell [phone] Toll Free: [phone]  Fax: [phone]

## 2019-05-20 ENCOUNTER — Other Ambulatory Visit: Payer: Self-pay | Admitting: Family Medicine

## 2019-05-20 LAB — BASIC METABOLIC PANEL
BUN/Creatinine Ratio: 11 (ref 10–24)
BUN: 11 mg/dL (ref 8–27)
CO2: 28 mmol/L (ref 20–29)
Calcium: 8.9 mg/dL (ref 8.6–10.2)
Chloride: 104 mmol/L (ref 96–106)
Creatinine, Ser: 0.99 mg/dL (ref 0.76–1.27)
GFR calc Af Amer: 92 mL/min/{1.73_m2} (ref >59)
GFR calc non Af Amer: 80 mL/min/{1.73_m2} (ref >59)
Glucose: 105 mg/dL — ABNORMAL HIGH (ref 65–99)
Potassium: 4.6 mmol/L (ref 3.5–5.2)
Sodium: 145 mmol/L — ABNORMAL HIGH (ref 134–144)

## 2019-05-20 LAB — PSA, TOTAL AND FREE
PSA, Free Pct: 57.1 %
PSA, Free: 0.4 ng/mL
Prostate Specific Ag, Serum: 0.7 ng/mL (ref 0.0–4.0)

## 2019-05-20 LAB — HIV ANTIBODY (ROUTINE TESTING W REFLEX): HIV Screen 4th Generation wRfx: NONREACTIVE

## 2019-05-20 LAB — HEMOGLOBIN A1C
Est. average glucose Bld gHb Est-mCnc: 114 mg/dL
Hgb A1c MFr Bld: 5.6 % (ref 4.8–5.6)

## 2019-05-20 LAB — HSV TYPE I/II IGG, IGMW/ REFLEX
HSV 1 Glycoprotein G Ab, IgG: 42.9 index — ABNORMAL HIGH (ref 0.00–0.90)
HSV 1 IgM: <1:10 {titer}
HSV 2 IgG, Type Spec: 1.89 index — ABNORMAL HIGH (ref 0.00–0.90)
HSV 2 IgM: <1:10 {titer}

## 2019-05-20 LAB — HSV-2 IGG SUPPLEMENTAL TEST: HSV-2 IgG Supplemental Test: POSITIVE — AB

## 2019-05-20 MED ORDER — METRONIDAZOLE 500 MG PO TABS: 500.0000 mg | ORAL_TABLET | Freq: Two times a day (BID) | ORAL | 0 refills | Status: DC

## 2019-05-20 MED FILL — metroNIDAZOLE 500 MG TABS: 500 | 7 days supply | Qty: 14 | Fill #0

## 2019-05-22 DIAGNOSIS — H43813 Vitreous degeneration, bilateral: Secondary | ICD-10-CM | POA: Diagnosis not present

## 2019-05-22 DIAGNOSIS — H25813 Combined forms of age-related cataract, bilateral: Secondary | ICD-10-CM | POA: Diagnosis not present

## 2019-05-25 MED FILL — LOSARTAN POTASSIUM 50 MG TA: 50 | 30 days supply | Qty: 30 | Fill #0

## 2019-05-25 MED FILL — PROLENSA 0.07% EYE DROPS: 0.07 | 45 days supply | Qty: 3 | Fill #0

## 2019-05-25 MED FILL — PANTOPRAZOLE SOD DR 40 MG T: 40 | 30 days supply | Qty: 30 | Fill #3

## 2019-06-08 ENCOUNTER — Ambulatory Visit: Payer: Medicare HMO | Admitting: Internal Medicine

## 2019-06-11 ENCOUNTER — Other Ambulatory Visit: Payer: Self-pay

## 2019-06-11 ENCOUNTER — Inpatient Hospital Stay (HOSPITAL_COMMUNITY)
Admission: EM | Admit: 2019-06-11 | Discharge: 2019-06-15 | DRG: 378 | Disposition: A | Discharge status: Home Health | Payer: Medicare HMO | Attending: Osteopathic Medicine | Admitting: Osteopathic Medicine

## 2019-06-11 ENCOUNTER — Emergency Department (HOSPITAL_COMMUNITY): Payer: Medicare HMO

## 2019-06-11 ENCOUNTER — Encounter (HOSPITAL_COMMUNITY): Payer: Self-pay | Admitting: Emergency Medicine

## 2019-06-11 DIAGNOSIS — F17210 Nicotine dependence, cigarettes, uncomplicated: Secondary | ICD-10-CM | POA: Diagnosis present

## 2019-06-11 DIAGNOSIS — H2511 Age-related nuclear cataract, right eye: Secondary | ICD-10-CM | POA: Diagnosis not present

## 2019-06-11 DIAGNOSIS — Z8673 Personal history of transient ischemic attack (TIA), and cerebral infarction without residual deficits: Secondary | ICD-10-CM | POA: Diagnosis not present

## 2019-06-11 DIAGNOSIS — R55 Syncope and collapse: Secondary | ICD-10-CM | POA: Diagnosis present

## 2019-06-11 DIAGNOSIS — Z7901 Long term (current) use of anticoagulants: Secondary | ICD-10-CM

## 2019-06-11 DIAGNOSIS — I428 Other cardiomyopathies: Secondary | ICD-10-CM | POA: Diagnosis not present

## 2019-06-11 DIAGNOSIS — Z833 Family history of diabetes mellitus: Secondary | ICD-10-CM

## 2019-06-11 DIAGNOSIS — K2971 Gastritis, unspecified, with bleeding: Principal | ICD-10-CM | POA: Diagnosis present

## 2019-06-11 DIAGNOSIS — I499 Cardiac arrhythmia, unspecified: Secondary | ICD-10-CM | POA: Diagnosis not present

## 2019-06-11 DIAGNOSIS — R000 Tachycardia, unspecified: Secondary | ICD-10-CM | POA: Diagnosis not present

## 2019-06-11 DIAGNOSIS — D62 Acute posthemorrhagic anemia: Secondary | ICD-10-CM | POA: Diagnosis not present

## 2019-06-11 DIAGNOSIS — I5042 Chronic combined systolic (congestive) and diastolic (congestive) heart failure: Secondary | ICD-10-CM | POA: Diagnosis not present

## 2019-06-11 DIAGNOSIS — I110 Hypertensive heart disease with heart failure: Secondary | ICD-10-CM | POA: Diagnosis present

## 2019-06-11 DIAGNOSIS — K922 Gastrointestinal hemorrhage, unspecified: Secondary | ICD-10-CM | POA: Diagnosis present

## 2019-06-11 DIAGNOSIS — K7400 Hepatic fibrosis, unspecified: Secondary | ICD-10-CM | POA: Diagnosis present

## 2019-06-11 DIAGNOSIS — I483 Typical atrial flutter: Secondary | ICD-10-CM

## 2019-06-11 DIAGNOSIS — I2510 Atherosclerotic heart disease of native coronary artery without angina pectoris: Secondary | ICD-10-CM | POA: Diagnosis present

## 2019-06-11 DIAGNOSIS — Z20822 Contact with and (suspected) exposure to covid-19: Secondary | ICD-10-CM | POA: Diagnosis not present

## 2019-06-11 DIAGNOSIS — Z03818 Encounter for observation for suspected exposure to other biological agents ruled out: Secondary | ICD-10-CM | POA: Diagnosis not present

## 2019-06-11 DIAGNOSIS — Z9981 Dependence on supplemental oxygen: Secondary | ICD-10-CM

## 2019-06-11 DIAGNOSIS — K5520 Angiodysplasia of colon without hemorrhage: Secondary | ICD-10-CM | POA: Diagnosis not present

## 2019-06-11 DIAGNOSIS — D649 Anemia, unspecified: Secondary | ICD-10-CM

## 2019-06-11 DIAGNOSIS — J449 Chronic obstructive pulmonary disease, unspecified: Secondary | ICD-10-CM | POA: Diagnosis present

## 2019-06-11 DIAGNOSIS — Z8249 Family history of ischemic heart disease and other diseases of the circulatory system: Secondary | ICD-10-CM

## 2019-06-11 DIAGNOSIS — D509 Iron deficiency anemia, unspecified: Secondary | ICD-10-CM | POA: Diagnosis not present

## 2019-06-11 DIAGNOSIS — I5043 Acute on chronic combined systolic (congestive) and diastolic (congestive) heart failure: Secondary | ICD-10-CM | POA: Diagnosis present

## 2019-06-11 DIAGNOSIS — I4891 Unspecified atrial fibrillation: Secondary | ICD-10-CM | POA: Diagnosis present

## 2019-06-11 DIAGNOSIS — Z800 Family history of malignant neoplasm of digestive organs: Secondary | ICD-10-CM

## 2019-06-11 DIAGNOSIS — D500 Iron deficiency anemia secondary to blood loss (chronic): Secondary | ICD-10-CM | POA: Diagnosis not present

## 2019-06-11 DIAGNOSIS — I4892 Unspecified atrial flutter: Secondary | ICD-10-CM | POA: Diagnosis not present

## 2019-06-11 DIAGNOSIS — R195 Other fecal abnormalities: Secondary | ICD-10-CM | POA: Diagnosis not present

## 2019-06-11 HISTORY — DX: Syncope and collapse: R55

## 2019-06-11 LAB — COMPREHENSIVE METABOLIC PANEL
ALT: 17 U/L (ref 0–44)
AST: 27 U/L (ref 15–41)
Albumin: 3.5 g/dL (ref 3.5–5.0)
Alkaline Phosphatase: 67 U/L (ref 38–126)
Anion gap: 12 (ref 5–15)
BUN: 11 mg/dL (ref 8–23)
CO2: 30 mmol/L (ref 22–32)
Calcium: 8.1 mg/dL — ABNORMAL LOW (ref 8.9–10.3)
Chloride: 99 mmol/L (ref 98–111)
Creatinine, Ser: 0.9 mg/dL (ref 0.61–1.24)
GFR calc Af Amer: >60 mL/min (ref >60)
GFR calc non Af Amer: >60 mL/min (ref >60)
Glucose, Bld: 119 mg/dL — ABNORMAL HIGH (ref 70–99)
Potassium: 4.8 mmol/L (ref 3.5–5.1)
Sodium: 141 mmol/L (ref 135–145)
Total Bilirubin: 0.5 mg/dL (ref 0.3–1.2)
Total Protein: 6.6 g/dL (ref 6.5–8.1)

## 2019-06-11 LAB — CBC WITH DIFFERENTIAL/PLATELET
Abs Immature Granulocytes: 0.02 10*3/uL (ref 0.00–0.07)
Basophils Absolute: 0 10*3/uL (ref 0.0–0.1)
Basophils Relative: 1 %
Eosinophils Absolute: 0 10*3/uL (ref 0.0–0.5)
Eosinophils Relative: 0 %
HCT: 24 % — ABNORMAL LOW (ref 39.0–52.0)
Hemoglobin: 5.6 g/dL — CL (ref 13.0–17.0)
Immature Granulocytes: 0 %
Lymphocytes Relative: 15 %
Lymphs Abs: 1 10*3/uL (ref 0.7–4.0)
MCH: 18.6 pg — ABNORMAL LOW (ref 26.0–34.0)
MCHC: 23.3 g/dL — ABNORMAL LOW (ref 30.0–36.0)
MCV: 79.7 fL — ABNORMAL LOW (ref 80.0–100.0)
Monocytes Absolute: 0.7 10*3/uL (ref 0.1–1.0)
Monocytes Relative: 10 %
Neutro Abs: 5.1 10*3/uL (ref 1.7–7.7)
Neutrophils Relative %: 74 %
Platelets: 210 10*3/uL (ref 150–400)
RBC: 3.01 MIL/uL — ABNORMAL LOW (ref 4.22–5.81)
RDW: 21.2 % — ABNORMAL HIGH (ref 11.5–15.5)
WBC: 7 10*3/uL (ref 4.0–10.5)
nRBC: 0.3 % — ABNORMAL HIGH (ref 0.0–0.2)

## 2019-06-11 LAB — BRAIN NATRIURETIC PEPTIDE: B Natriuretic Peptide: 373.4 pg/mL — ABNORMAL HIGH (ref 0.0–100.0)

## 2019-06-11 LAB — POC OCCULT BLOOD, ED: Fecal Occult Bld: POSITIVE — AB

## 2019-06-11 LAB — MAGNESIUM: Magnesium: 1.9 mg/dL (ref 1.7–2.4)

## 2019-06-11 LAB — TROPONIN I (HIGH SENSITIVITY)
Troponin I (High Sensitivity): 31 ng/L — ABNORMAL HIGH (ref <18)
Troponin I (High Sensitivity): 20 ng/L — ABNORMAL HIGH (ref <18)

## 2019-06-11 LAB — PREPARE RBC (CROSSMATCH)

## 2019-06-11 MED ORDER — ONDANSETRON HCL 4 MG PO TABS: 4.0000 mg | ORAL_TABLET | Freq: Four times a day (QID) | ORAL | Status: DC | PRN

## 2019-06-11 MED ORDER — PANTOPRAZOLE SODIUM 40 MG IV SOLR
40.0000 mg | Freq: Two times a day (BID) | INTRAVENOUS | Status: DC
  Administered 2019-06-15: 12:00:00 40 mg via INTRAVENOUS
  Filled 2019-06-11: qty 40

## 2019-06-11 MED ORDER — CARVEDILOL 3.125 MG PO TABS
3.1250 mg | ORAL_TABLET | Freq: Two times a day (BID) | ORAL | Status: DC
  Administered 2019-06-12 – 2019-06-15 (×7): 3.125 mg via ORAL
  Filled 2019-06-11 (×7): qty 1

## 2019-06-11 MED ORDER — SODIUM CHLORIDE 0.9 % IV SOLN
10.0000 mL/h | Freq: Once | INTRAVENOUS | Status: AC
  Administered 2019-06-11: 10 mL/h via INTRAVENOUS

## 2019-06-11 MED ORDER — ACETAMINOPHEN 650 MG RE SUPP: 650.0000 mg | Freq: Four times a day (QID) | RECTAL | Status: DC | PRN

## 2019-06-11 MED ORDER — ALBUTEROL SULFATE (2.5 MG/3ML) 0.083% IN NEBU: 2.5000 mg | INHALATION_SOLUTION | Freq: Four times a day (QID) | RESPIRATORY_TRACT | Status: DC | PRN

## 2019-06-11 MED ORDER — ONDANSETRON HCL 4 MG/2ML IJ SOLN: 4.0000 mg | Freq: Four times a day (QID) | INTRAMUSCULAR | Status: DC | PRN

## 2019-06-11 MED ORDER — UMECLIDINIUM BROMIDE 62.5 MCG/INH IN AEPB
1.0000 | INHALATION_SPRAY | Freq: Every day | RESPIRATORY_TRACT | Status: DC
  Administered 2019-06-12 – 2019-06-15 (×4): 1 via RESPIRATORY_TRACT
  Filled 2019-06-11: qty 7

## 2019-06-11 MED ORDER — ACETAMINOPHEN 325 MG PO TABS
650.0000 mg | ORAL_TABLET | Freq: Four times a day (QID) | ORAL | Status: DC | PRN
  Administered 2019-06-12 – 2019-06-13 (×2): 650 mg via ORAL
  Filled 2019-06-11 (×2): qty 2

## 2019-06-11 MED ORDER — PANTOPRAZOLE SODIUM 40 MG IV SOLR
40.0000 mg | Freq: Once | INTRAVENOUS | Status: AC
  Administered 2019-06-11: 23:00:00 40 mg via INTRAVENOUS
  Filled 2019-06-11: qty 40

## 2019-06-11 MED ORDER — SODIUM CHLORIDE 0.9 % IV SOLN
8.0000 mg/h | INTRAVENOUS | Status: AC
  Administered 2019-06-12 – 2019-06-13 (×5): 8 mg/h via INTRAVENOUS
  Filled 2019-06-11 (×7): qty 80

## 2019-06-11 NOTE — ED Notes (Signed)
Electronic blood consent obtained.  

## 2019-06-11 NOTE — ED Provider Notes (Signed)
[facility] EMERGENCY DEPARTMENT Provider Note   CSN: [id number] Arrival date & time: 06/11/19  1725     History Chief Complaint  Patient presents with  . Near Syncope    Jared [name] is a 63 y.o. male.  The history is provided by the patient and medical records.  Near Syncope This is a recurrent problem. The current episode started 1 to 2 hours ago. The problem occurs daily. The problem has not changed since onset.Pertinent negatives include no chest pain, no abdominal pain, no headaches and no shortness of breath. Nothing aggravates the symptoms. Nothing relieves the symptoms. He has tried nothing for the symptoms. The treatment provided no relief.       Past Medical History:  Diagnosis Date  . CAD (coronary artery disease)    a. LHC 5/12:  LAD 20, pLCx 20, pRCA 40, dRCA 40, EF 35%, diff HK  //  b. Myoview 4/16: Overall Impression: High risk stress nuclear study There is no evidence of ischemia. There is severe LV dysfunction. LV Ejection Fraction: 30%. LV Wall Motion: There is global LV hypokinesis.   Marland Kitchen CAP (community acquired pneumonia) 09/2013  . Chronic combined systolic and diastolic CHF (congestive heart failure) (HCC)    a. Echo 4/16:Mild LVH, EF 40-45%, diffuse HK //  b. Echo 8/17: EF 35-40%, diffuse HK, diastolic dysfunction, aortic sclerosis, trivial MR, moderate LAE, normal RVSF, moderate RAE, mild TR, PASP 42 mmHg // c. Echo 4/18: Mild concentric LVH, EF 30-35, normal wall motion, grade 1 diastolic dysfunction, PASP 49  . Cluster headache    "hx; haven't had one in awhile" (01/09/2016)  . COPD (chronic obstructive pulmonary disease) (HCC)    [name] 01/09/2016  . History of CVA (cerebrovascular accident)   . Hypertension   . NICM (nonischemic cardiomyopathy) (HCC)   . Tobacco abuse     Patient Active Problem List   Diagnosis Date Noted  . Chronic right maxillary sinusitis 01/29/2019  . Chronic respiratory failure with hypoxia and  hypercapnia (HCC) 01/01/2019  . Acute on chronic respiratory failure with hypoxia and hypercapnia (HCC) 11/10/2018  . GI bleed 10/27/2018  . Chronic anticoagulation 09/27/2018  . AV malformation of gastrointestinal tract   . Occult GI bleeding   . Heme positive stool   . Iron deficiency anemia   . Symptomatic anemia 09/04/2018  . Tobacco use disorder 09/04/2018  . Noncompliance 06/14/2017  . Elevated LFTs 08/13/2016  . Chronic respiratory failure with hypoxia (HCC) 07/24/2016  . Atrial flutter (HCC)   . Hepatic congestion 07/02/2016  . Chronic combined systolic and diastolic CHF (congestive heart failure) (HCC) 07/01/2016  . COPD with acute exacerbation (HCC) 06/03/2016  . Prediabetes 05/14/2016  . Hemoptysis 05/06/2016  . COPD GOLD III/still smoking  02/29/2016  . Cigarette smoker 01/09/2016  . CAD (coronary artery disease) 10/07/2013  . 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 Centrilobular emphysema (HCC) 10/04/2013  . Essential hypertension     Past Surgical History:  Procedure Laterality Date  . CARDIAC CATHETERIZATION  10/2010   LM normal, LAD with 20% irregularities, LCX with 20%, RCA with 40% prox and 40% distal - EF of 35%  . COLONOSCOPY W/ BIOPSIES AND POLYPECTOMY    . COLONOSCOPY WITH PROPOFOL N/A 09/06/2018   Procedure: COLONOSCOPY WITH PROPOFOL;  Surgeon: Tressia Danas, MD;  Location: [facility] ENDOSCOPY;  Service: Gastroenterology;  Laterality: N/A;  . ENTEROSCOPY N/A 09/28/2018   Procedure: ENTEROSCOPY;  Surgeon: Jeani Hawking, MD;  Location: [facility] ENDOSCOPY;  Service: Endoscopy;  Laterality: N/A;  . ENTEROSCOPY N/A 10/28/2018   Procedure: ENTEROSCOPY;  Surgeon: Tressia Danas, MD;  Location: [facility] ENDOSCOPY;  Service: Gastroenterology;  Laterality: N/A;  . ESOPHAGOGASTRODUODENOSCOPY (EGD) WITH PROPOFOL N/A 09/05/2018   Procedure: ESOPHAGOGASTRODUODENOSCOPY (EGD) WITH PROPOFOL;  Surgeon: Benancio Deeds, MD;  Location: [facility] ENDOSCOPY;  Service:  Gastroenterology;  Laterality: N/A;  . EXCISION MASS HEAD    . GIVENS CAPSULE STUDY N/A 09/06/2018   Procedure: GIVENS CAPSULE STUDY;  Surgeon: Tressia Danas, MD;  Location: [facility] ENDOSCOPY;  Service: Gastroenterology;  Laterality: N/A;  . GIVENS CAPSULE STUDY N/A 09/26/2018   Procedure: GIVENS CAPSULE STUDY;  Surgeon: Beverley Fiedler, MD;  Location: [facility] ENDOSCOPY;  Service: Gastroenterology;  Laterality: N/A;  . HOT HEMOSTASIS N/A 10/28/2018   Procedure: HOT HEMOSTASIS (ARGON PLASMA COAGULATION/BICAP);  Surgeon: Tressia Danas, MD;  Location: [facility] ENDOSCOPY;  Service: Gastroenterology;  Laterality: N/A;  . INCISION AND DRAINAGE PERIRECTAL ABSCESS N/A 06/05/2017   Procedure: IRRIGATION AND DEBRIDEMENT PERIRECTAL ABSCESS;  Surgeon: Andria Meuse, MD;  Location: [facility] OR;  Service: General;  Laterality: N/A;  . VIDEO BRONCHOSCOPY Bilateral 05/08/2016   Procedure: VIDEO BRONCHOSCOPY WITH FLUORO;  Surgeon: Oretha Milch, MD;  Location: [facility] ENDOSCOPY;  Service: Cardiopulmonary;  Laterality: Bilateral;       Family History  Problem Relation Age of Onset  . Heart disease Mother   . Diabetes Mother   . Colon cancer Mother   . Liver cancer Mother   . Cancer Father        type unknown  . Diabetes Sister        x 2  . Diabetes Brother     Social History   Tobacco Use  . Smoking status: Current Every Day Smoker    Packs/day: 2.00    Years: 47.00    Pack years: 94.00    Types: Cigarettes  . Smokeless tobacco: Never Used  . Tobacco comment: 5 cigs daily(03/09/19)  Substance Use Topics  . Alcohol use: Yes    Alcohol/week: 0.0 standard drinks    Comment: last drink was before xmas  . Drug use: No    Types: Cocaine, Marijuana    Comment: "nothing in 20 years"    Home Medications Prior to Admission medications   Medication Sig Start Date End Date Taking? Authorizing Provider  Aclidinium Bromide (TUDORZA PRESSAIR) 400 MCG/ACT AEPB Inhale 1 puff into the lungs 2 (two) times daily.  01/28/19   Nyoka Cowden, MD  albuterol (PROVENTIL) (2.5 MG/3ML) 0.083% nebulizer solution Take 3 mLs (2.5 mg total) by nebulization every 4 (four) hours as needed for wheezing or shortness of breath. 12/31/18   Nyoka Cowden, MD  albuterol (VENTOLIN HFA) 108 (90 Base) MCG/ACT inhaler Inhale 2 puffs into the lungs every 6 (six) hours as needed for wheezing or shortness of breath. 11/19/18   [name], MD  atorvastatin (LIPITOR) 80 MG tablet Take 1 tablet (80 mg total) by mouth daily. 05/18/19   [name], MD  carvedilol (COREG) 3.125 MG tablet Take 1 tablet (3.125 mg total) by mouth 2 (two) times daily. 05/18/19 05/17/20  [name], MD  cetirizine (ZYRTEC) 10 MG tablet Take 1 tablet (10 mg total) by mouth daily. 05/18/19   [name], MD  ferrous sulfate 325 (65 FE) MG tablet Take 1 tablet (325 mg total) by mouth daily with breakfast. 09/09/18   Danis, Andreas Blower, MD  fluticasone (FLONASE) 50 MCG/ACT nasal spray Place 1 spray into both nostrils daily for 30 days.  11/12/18 03/09/19  Shelly Coss, MD  furosemide (LASIX) 40 MG tablet Take 1 tablet (40 mg total) by mouth daily. 05/18/19 05/17/20  Charlott Rakes, MD  gabapentin (NEURONTIN) 300 MG capsule Take 2 capsules (600 mg total) by mouth at bedtime. 05/18/19   Charlott Rakes, MD  losartan (COZAAR) 50 MG tablet Take 1 tablet (50 mg total) by mouth daily. 05/18/19   Charlott Rakes, MD  metroNIDAZOLE (FLAGYL) 500 MG tablet Take 1 tablet (500 mg total) by mouth 2 (two) times daily. 05/20/19   Charlott Rakes, MD  Misc. Devices MISC Portable oxygen concentrator. Diagnosis COPD. 12/05/18   Charlott Rakes, MD  nicotine (NICODERM CQ - DOSED IN MG/24 HOURS) 14 mg/24hr patch Place 1 patch (14 mg total) onto the skin daily. Patient not taking: Reported on 05/18/2019 11/12/18   Shelly Coss, MD  rivaroxaban (XARELTO) 20 MG TABS tablet Take 1 tablet (20 mg total) by mouth daily with supper. 05/18/19   Charlott Rakes, MD    Allergies      Lisinopril  Review of Systems   Review of Systems  Constitutional: Negative for chills, fatigue and fever.  HENT: Negative for congestion.   Respiratory: Negative for shortness of breath.   Cardiovascular: Positive for palpitations and near-syncope. Negative for chest pain and leg swelling.  Gastrointestinal: Negative for abdominal pain, constipation, diarrhea, nausea and vomiting.  Genitourinary: Negative for flank pain and frequency.  Musculoskeletal: Negative for back pain, neck pain and neck stiffness.  Skin: Negative for rash and wound.  Neurological: Positive for tremors and syncope. Negative for dizziness, weakness, light-headedness, numbness and headaches.  Psychiatric/Behavioral: Negative for agitation.  All other systems reviewed and are negative.   Physical Exam Updated Vital Signs BP (!) 112/97   Pulse (!) 122   Temp 99.5 F (37.5 C)   Resp (!) 25   SpO2 97%   Physical Exam Vitals and nursing note reviewed.  Constitutional:      General: He is not in acute distress.    Appearance: He is well-developed. He is not ill-appearing, toxic-appearing or diaphoretic.  HENT:     Nose: No congestion or rhinorrhea.     Mouth/Throat:     Mouth: Mucous membranes are moist.     Pharynx: No oropharyngeal exudate.  Eyes:     Conjunctiva/sclera: Conjunctivae normal.     Pupils: Pupils are equal, round, and reactive to light.  Cardiovascular:     Rate and Rhythm: Normal rate and regular rhythm.     Pulses: Normal pulses.     Heart sounds: No murmur.  Pulmonary:     Effort: Pulmonary effort is normal. No respiratory distress.     Breath sounds: Normal breath sounds. No wheezing, rhonchi or rales.  Chest:     Chest wall: No tenderness.  Abdominal:     General: Abdomen is flat.     Palpations: Abdomen is soft.     Tenderness: There is no abdominal tenderness. There is no right CVA tenderness, left CVA tenderness, guarding or rebound.  Musculoskeletal:        General: No  tenderness.     Cervical back: Neck supple.  Skin:    General: Skin is warm and dry.     Capillary Refill: Capillary refill takes less than 2 seconds.  Neurological:     General: No focal deficit present.     Mental Status: He is alert.     GCS: GCS eye subscore is 4. GCS verbal subscore is 5. GCS motor subscore  is 6.     Motor: Tremor present. No weakness or abnormal muscle tone.     Comments: Tremor in left arm.  Otherwise normal sensation and strength in all extremities.  Symmetric smile.  Psychiatric:        Mood and Affect: Mood normal.     ED Results / Procedures / Treatments   Labs (all labs ordered are listed, but only abnormal results are displayed) Labs Reviewed  CBC WITH DIFFERENTIAL/PLATELET - Abnormal; Notable for the following components:      Result Value   RBC 3.01 (*)    Hemoglobin 5.6 (*)    HCT 24.0 (*)    MCV 79.7 (*)    MCH 18.6 (*)    MCHC 23.3 (*)    RDW 21.2 (*)    nRBC 0.3 (*)    All other components within normal limits  COMPREHENSIVE METABOLIC PANEL - Abnormal; Notable for the following components:   Glucose, Bld 119 (*)    Calcium 8.1 (*)    All other components within normal limits  POC OCCULT BLOOD, ED - Abnormal; Notable for the following components:   Fecal Occult Bld POSITIVE (*)    All other components within normal limits  TROPONIN I (HIGH SENSITIVITY) - Abnormal; Notable for the following components:   Troponin I (High Sensitivity) 31 (*)    All other components within normal limits  TROPONIN I (HIGH SENSITIVITY) - Abnormal; Notable for the following components:   Troponin I (High Sensitivity) 20 (*)    All other components within normal limits  SARS CORONAVIRUS 2 (TAT 6-24 HRS)  MAGNESIUM  BRAIN NATRIURETIC PEPTIDE  TYPE AND SCREEN  PREPARE RBC (CROSSMATCH)    EKG EKG Interpretation  Date/Time:  Thursday June 11 2019 17:29:34 EST Ventricular Rate:  111 PR Interval:    QRS Duration: 100 QT Interval:  354 QTC  Calculation: 481 R Axis:   -80 Text Interpretation: Sinus tachycardia Inferior infarct, old When comapred to prior, faster rate.  less PVC seen. No STEMI Confirmed by Theda Belfast ([id number]) on 06/11/2019 5:47:50 PM   Radiology DG Chest Portable 1 View  Result Date: 06/11/2019 CLINICAL DATA:  Syncope. EXAM: PORTABLE CHEST 1 VIEW COMPARISON:  November 11, 2018. FINDINGS: Stable cardiomegaly. No pneumothorax or pleural effusion is noted. Atherosclerosis of thoracic aorta is noted. Both lungs are clear. The visualized skeletal structures are unremarkable. IMPRESSION: Aortic atherosclerosis. No acute cardiopulmonary abnormality seen. Electronically Signed   By: Lupita Raider M.D.   On: 06/11/2019 17:51    Procedures Procedures (including critical care time)  CRITICAL CARE Performed by: Canary Brim Lawernce Earll Total critical care time: 35 minutes Critical care time was exclusive of separately billable procedures and treating other patients. Symptomatic anemia with syncope and occult GI bleed with hemoglobin of 5.1 requiring transfusion. Critical care was necessary to treat or prevent imminent or life-threatening deterioration. Critical care was time spent personally by me on the following activities: development of treatment plan with patient and/or surrogate as well as nursing, discussions with consultants, evaluation of patient's response to treatment, examination of patient, obtaining history from patient or surrogate, ordering and performing treatments and interventions, ordering and review of laboratory studies, ordering and review of radiographic studies, pulse oximetry and re-evaluation of patient's condition.   Medications Ordered in ED Medications  0.9 %  sodium chloride infusion (has no administration in time range)  pantoprazole (PROTONIX) injection 40 mg (has no administration in time range)    ED Course  I  have reviewed the triage vital signs and the nursing notes.  Pertinent labs  & imaging results that were available during my care of the patient were reviewed by me and considered in my medical decision making (see chart for details).    MDM Rules/Calculators/A&P                      Bethann BerkshireJohnny Kennith CenterHines is a 63 y.o. male with a past medical history significant for COPD on 3 L home oxygen at baseline, CAD, CHF, prior stroke, atrial fibrillation on Xarelto, and prior GI bleed who presents with syncopal episodes and possible V. tach.  Patient reports that for the few weeks he has had increasing episodes of syncopal episodes.  He does report associated palpitations with him but denies chest pain or shortness of breath.  He says that today he had a syncopal episode and EMS was able to see an episode of V. tach while they were transporting him.  Denies any new fevers, chills, congestion, but does report 1 week of dry cough.  Denies any sick contacts or Covid contacts.  He does report that he had cataract surgery earlier today on his right eye but denies any significant anesthesia or sedation.  He denies any urinary symptoms or GI symptoms.  He is simply here because of these increasing passing out episodes and lightheaded episodes associated with palpitations and the possible V. tach.  On exam, patient has eye protection on his right eye from his recent surgery.  Other pupil is reactive with normal extraocular movements.  Speech is clear.  No focal neurologic deficits however patient does have a left-sided resting tremor in his left arm which he reports he has had for years.  Normal sensation in both arms and legs.  Normal strength in extremities.  His lungs were clear and chest was nontender.  Abdomen was nontender.  I did not appreciate a murmur.  Patient had no other abnormalities on exam.  Unfortunately, we cannot see rhythm strip or ECG from the V. tach episode however he is now on telemetry with pacer and pads in place.  Will get screening electrolytes and labs however with his syncopal  episodes and EMS stating they saw V. tach, patient may need admission.  Will get x-ray of his chest for this cough and the syncopal episode.   7:21 PM Patient's labs again returned and he was found have a hemoglobin of 5.6, significantly decreased from prior.  Chart review does show that he has had a history of GI bleeds in the past, suspect occult GI bleed again.  He will have a fecal occult test performed and we will give him blood for syncopal episode likely due to symptomatic anemia.  Otherwise, chest x-ray was reassuring and magnesium was normal.  Metabolic panel only showed mild hypocalcemia.  No leukocytosis.  Patient will be admitted for syncope and symptomatic anemia.  Chart review shows that patient has been seeing Ellison HughsLila Bauer, GI earlier this year.  They were called to see patient.  He will be given Protonix for possible upper GI bleed and will be admitted to medicine for further management of his syncope.  Patient will be admitted to medicine.  Final Clinical Impression(s) / ED Diagnoses Final diagnoses:  Syncope, unspecified syncope type  Symptomatic anemia    Clinical Impression: 1. Syncope, unspecified syncope type   2. Symptomatic anemia     Disposition: Admit  This note was prepared with assistance of Dragon voice recognition  software. Occasional wrong-word or sound-a-like substitutions may have occurred due to the inherent limitations of voice recognition software.     Tredarius Cobern, Canary Brimhristopher J, MD 06/11/19 2053

## 2019-06-11 NOTE — ED Notes (Signed)
ED TO INPATIENT HANDOFF REPORT  ED Nurse Name and Phone #: [phone]  S Name/Age/Gender Jared [name] 63 y.o. male Room/Bed: 046C/046C  Code Status   Code Status: Prior  Home/SNF/Other Home Patient oriented to: self, place, time and situation Is this baseline? Yes   Triage Complete: Triage complete  Chief Complaint Acute GI bleeding [K92.2]  Triage Note Pt here from home with c/o near syncopal episode , pt had cataracts surgery this morning , pt has been having these near syncopal episodes according to family , ems called out for fall , ems witnessed a run of V tach , pt is on 3 liters otc , still smokes     Allergies Allergies  Allergen Reactions  . Lisinopril Cough    Level of Care/Admitting Diagnosis ED Disposition    ED Disposition Condition Comment   Admit  Hospital Area: [facility] [[id number]]  Level of Care: Progressive [102]  Admit to Progressive based on following criteria: MULTISYSTEM THREATS such as stable sepsis, metabolic/electrolyte imbalance with or without encephalopathy that is responding to early treatment.  Covid Evaluation: Asymptomatic Screening Protocol (No Symptoms)  Diagnosis: Acute GI bleeding [094076]  Admitting Physician: Eduard Clos [phone]  Attending Physician: Eduard Clos [phone]  Estimated length of stay: past midnight tomorrow  Certification:: I certify this patient will need inpatient services for at least 2 midnights       B Medical/Surgery History Past Medical History:  Diagnosis Date  . CAD (coronary artery disease)    a. LHC 5/12:  LAD 20, pLCx 20, pRCA 40, dRCA 40, EF 35%, diff HK  //  b. Myoview 4/16: Overall Impression: High risk stress nuclear study There is no evidence of ischemia. There is severe LV dysfunction. LV Ejection Fraction: 30%. LV Wall Motion: There is global LV hypokinesis.   Marland Kitchen CAP (community acquired pneumonia) 09/2013  . Chronic combined systolic and diastolic CHF  (congestive heart failure) (HCC)    a. Echo 4/16:Mild LVH, EF 40-45%, diffuse HK //  b. Echo 8/17: EF 35-40%, diffuse HK, diastolic dysfunction, aortic sclerosis, trivial MR, moderate LAE, normal RVSF, moderate RAE, mild TR, PASP 42 mmHg // c. Echo 4/18: Mild concentric LVH, EF 30-35, normal wall motion, grade 1 diastolic dysfunction, PASP 49  . Cluster headache    "hx; haven't had one in awhile" (01/09/2016)  . COPD (chronic obstructive pulmonary disease) (HCC)    [name] 01/09/2016  . History of CVA (cerebrovascular accident)   . Hypertension   . NICM (nonischemic cardiomyopathy) (HCC)   . Tobacco abuse    Past Surgical History:  Procedure Laterality Date  . CARDIAC CATHETERIZATION  10/2010   LM normal, LAD with 20% irregularities, LCX with 20%, RCA with 40% prox and 40% distal - EF of 35%  . COLONOSCOPY W/ BIOPSIES AND POLYPECTOMY    . COLONOSCOPY WITH PROPOFOL N/A 09/06/2018   Procedure: COLONOSCOPY WITH PROPOFOL;  Surgeon: Tressia Danas, MD;  Location: [facility] ENDOSCOPY;  Service: Gastroenterology;  Laterality: N/A;  . ENTEROSCOPY N/A 09/28/2018   Procedure: ENTEROSCOPY;  Surgeon: Jeani Hawking, MD;  Location: [facility] ENDOSCOPY;  Service: Endoscopy;  Laterality: N/A;  . ENTEROSCOPY N/A 10/28/2018   Procedure: ENTEROSCOPY;  Surgeon: Tressia Danas, MD;  Location: [facility] ENDOSCOPY;  Service: Gastroenterology;  Laterality: N/A;  . ESOPHAGOGASTRODUODENOSCOPY (EGD) WITH PROPOFOL N/A 09/05/2018   Procedure: ESOPHAGOGASTRODUODENOSCOPY (EGD) WITH PROPOFOL;  Surgeon: Benancio Deeds, MD;  Location: [facility] ENDOSCOPY;  Service: Gastroenterology;  Laterality: N/A;  . EXCISION MASS HEAD    .  GIVENS CAPSULE STUDY N/A 09/06/2018   Procedure: GIVENS CAPSULE STUDY;  Surgeon: Thornton Park, MD;  Location: [place];  Service: Gastroenterology;  Laterality: N/A;  . GIVENS CAPSULE STUDY N/A 09/26/2018   Procedure: GIVENS CAPSULE STUDY;  Surgeon: Jerene Bears, MD;  Location: [place];  Service:  Gastroenterology;  Laterality: N/A;  . HOT HEMOSTASIS N/A 10/28/2018   Procedure: HOT HEMOSTASIS (ARGON PLASMA COAGULATION/BICAP);  Surgeon: Thornton Park, MD;  Location: [place];  Service: Gastroenterology;  Laterality: N/A;  . INCISION AND DRAINAGE PERIRECTAL ABSCESS N/A 06/05/2017   Procedure: IRRIGATION AND DEBRIDEMENT PERIRECTAL ABSCESS;  Surgeon: Ileana Roup, MD;  Location: [place];  Service: General;  Laterality: N/A;  . VIDEO BRONCHOSCOPY Bilateral 05/08/2016   Procedure: VIDEO BRONCHOSCOPY WITH FLUORO;  Surgeon: Rigoberto Noel, MD;  Location: [place];  Service: Cardiopulmonary;  Laterality: Bilateral;     A IV Location/Drains/Wounds Patient Lines/Drains/Airways Status   Active Line/Drains/Airways    Name:   Placement date:   Placement time:   Site:   Days:   Peripheral IV 06/11/19 Left Hand   06/11/19    1730    Hand   less than 1          Intake/Output Last 24 hours No intake or output data in the 24 hours ending 06/11/19 2112  Labs/Imaging Results for orders placed or performed during the hospital encounter of 06/11/19 (from the past 48 hour(s))  CBC with Differential     Status: Abnormal   Collection Time: 06/11/19  6:25 PM  Result Value Ref Range   WBC 7.0 4.0 - 10.5 K/uL   RBC 3.01 (L) 4.22 - 5.81 MIL/uL   Hemoglobin 5.6 (LL) 13.0 - 17.0 g/dL    Comment: REPEATED TO VERIFY THIS CRITICAL RESULT HAS VERIFIED AND BEEN CALLED TO C GROSE RN BY KIRSTENE FORSYTH ON 12 31 2020 AT [place], AND HAS BEEN READ BACK.     HCT 24.0 (L) 39.0 - 52.0 %   MCV 79.7 (L) 80.0 - 100.0 fL   MCH 18.6 (L) 26.0 - 34.0 pg   MCHC 23.3 (L) 30.0 - 36.0 g/dL   RDW 21.2 (H) 11.5 - 15.5 %   Platelets 210 150 - 400 K/uL   nRBC 0.3 (H) 0.0 - 0.2 %   Neutrophils Relative % 74 %   Neutro Abs 5.1 1.7 - 7.7 K/uL   Lymphocytes Relative 15 %   Lymphs Abs 1.0 0.7 - 4.0 K/uL   Monocytes Relative 10 %   Monocytes Absolute 0.7 0.1 - 1.0 K/uL   Eosinophils Relative 0 %   Eosinophils  Absolute 0.0 0.0 - 0.5 K/uL   Basophils Relative 1 %   Basophils Absolute 0.0 0.0 - 0.1 K/uL   Immature Granulocytes 0 %   Abs Immature Granulocytes 0.02 0.00 - 0.07 K/uL    Comment: Performed at [facility] [address], [place], [place] [address]  Comprehensive metabolic panel     Status: Abnormal   Collection Time: 06/11/19  6:25 PM  Result Value Ref Range   Sodium 141 135 - 145 mmol/L   Potassium 4.8 3.5 - 5.1 mmol/L   Chloride 99 98 - 111 mmol/L   CO2 30 22 - 32 mmol/L   Glucose, Bld 119 (H) 70 - 99 mg/dL   BUN 11 8 - 23 mg/dL   Creatinine, Ser 0.90 0.61 - 1.24 mg/dL   Calcium 8.1 (L) 8.9 - 10.3 mg/dL   Total Protein 6.6 6.5 -  8.1 g/dL   Albumin 3.5 3.5 - 5.0 g/dL   AST 27 15 - 41 U/L   ALT 17 0 - 44 U/L   Alkaline Phosphatase 67 38 - 126 U/L   Total Bilirubin 0.5 0.3 - 1.2 mg/dL   GFR calc non Af Amer >60 >60 mL/min   GFR calc Af Amer >60 >60 mL/min   Anion gap 12 5 - 15    Comment: Performed at [facility], [address] [place], [place] [address]  Troponin I (High Sensitivity)     Status: Abnormal   Collection Time: 06/11/19  6:25 PM  Result Value Ref Range   Troponin I (High Sensitivity) 31 (H) <18 ng/L    Comment: (NOTE) Elevated high sensitivity troponin I (hsTnI) values and significant  changes across serial measurements may suggest ACS but many other  chronic and acute conditions are known to elevate hsTnI results.  Refer to the "Links" section for chest pain algorithms and additional  guidance. Performed at [facility], [address] [place], [place] [address]   Magnesium     Status: None   Collection Time: 06/11/19  6:25 PM  Result Value Ref Range   Magnesium 1.9 1.7 - 2.4 mg/dL    Comment: Performed at [facility], [address] [place], [place] [address]  Prepare RBC     Status: None   Collection Time: 06/11/19  6:43 PM  Result Value Ref Range   Order Confirmation      ORDER PROCESSED BY BLOOD BANK Performed at  [facility], [address] [place], [place] [address]   Type and screen [facility]     Status: None (Preliminary result)   Collection Time: 06/11/19  6:55 PM  Result Value Ref Range   ABO/RH(D) O POS    Antibody Screen NEG    Sample Expiration 06/14/2019,2359    Unit Number [id number]    Blood Component Type RED CELLS,LR    Unit division 00    Status of Unit ISSUED    Transfusion Status OK TO TRANSFUSE    Crossmatch Result      Compatible Performed at [facility], [address] [place], [place] [address]    Unit Number [id number]    Blood Component Type RED CELLS,LR    Unit division 00    Status of Unit ALLOCATED    Transfusion Status OK TO TRANSFUSE    Crossmatch Result Compatible   Troponin I (High Sensitivity)     Status: Abnormal   Collection Time: 06/11/19  7:22 PM  Result Value Ref Range   Troponin I (High Sensitivity) 20 (H) <18 ng/L    Comment: (NOTE) Elevated high sensitivity troponin I (hsTnI) values and significant  changes across serial measurements may suggest ACS but many other  chronic and acute conditions are known to elevate hsTnI results.  Refer to the "Links" section for chest pain algorithms and additional  guidance. Performed at [facility], [address] [place], [place] [address]   POC occult blood, ED     Status: Abnormal   Collection Time: 06/11/19  7:58 PM  Result Value Ref Range   Fecal Occult Bld POSITIVE (A) NEGATIVE   DG Chest Portable 1 View  Result Date: 06/11/2019 CLINICAL DATA:  Syncope. EXAM: PORTABLE CHEST 1 VIEW COMPARISON:  November 11, 2018. FINDINGS: Stable cardiomegaly. No pneumothorax or pleural effusion is noted. Atherosclerosis of thoracic aorta is  noted. Both lungs are clear. The visualized skeletal structures are unremarkable. IMPRESSION: Aortic atherosclerosis. No acute cardiopulmonary abnormality seen. Electronically Signed   By: Lupita Raider M.D.   On: 06/11/2019 17:51     Pending Labs Unresulted Labs (From admission, onward)    Start     Ordered   06/11/19 2021  SARS CORONAVIRUS 2 (TAT 6-24 HRS) Nasopharyngeal Nasopharyngeal Swab  (Tier 3 (TAT 6-24 hrs))  Once,   STAT    Question Answer Comment  Is this test for diagnosis or screening Screening   Symptomatic for COVID-19 as defined by CDC No   Hospitalized for COVID-19 No   Admitted to ICU for COVID-19 No   Previously tested for COVID-19 Yes   Resident in a congregate (group) care setting No   Employed in healthcare setting No      06/11/19 2020   06/11/19 1736  Brain natriuretic peptide  Once,   STAT     06/11/19 1736          Vitals/Pain Today's Vitals   06/11/19 1945 06/11/19 2000 06/11/19 2015 06/11/19 2029  BP: 134/78 129/73 128/75 (!) 112/97  Pulse: (!) 112 (!) 110 (!) 109 (!) 122  Resp: 20 (!) 25 (!) 23 (!) 25  Temp:    99.5 F (37.5 C)  TempSrc:      SpO2: 100% 96% 94% 97%  PainSc:        Isolation Precautions No active isolations  Medications Medications  0.9 %  sodium chloride infusion (has no administration in time range)  pantoprazole (PROTONIX) injection 40 mg (has no administration in time range)    Mobility walks High fall risk   Focused Assessments Cardiac Assessment Handoff:  Cardiac Rhythm: Sinus tachycardia Lab Results  Component Value Date   TROPONINI <0.03 11/09/2018   Lab Results  Component Value Date   DDIMER 1.55 (H) 02/20/2016   Does the Patient currently have chest pain? No     R Recommendations: See Admitting Provider Note  Report given to:   Additional Notes: -

## 2019-06-11 NOTE — H&P (Signed)
History and Physical    Jared [name] ZTI:[id number] DOB: 05/17/1956 DOA: 06/11/2019  PCP: Charlott Rakes, MD  Patient coming from: Home.  History obtained from patient's daughter.  Chief Complaint: Near syncopal episode.  HPI: Jared [name] is a 63 y.o. 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  He regained his consciousness soon and was supposed to go back to his ophthalmologist office when sitting in the car patient again had a near syncopal episode.  Felt very weak and was brought to the ER.  EMS felt that patient had a brief run of nonsustained V. tach.  No recording was obtained.  Patient states his Xarelto is on hold for surgery since yesterday.  ED Course: In the ER patient appeared tachycardic blood pressure was more than 825 systolic labs reveal hemoglobin of 5.6.  Stool for occult blood was positive.  Covid test was negative.  High sensitive troponin was 31 and 20.  PRBC ordered Protonix infusion started and the ER physician notified on call about GI.  Review of Systems: As per HPI, rest all negative.   Past Medical History:  Diagnosis Date  . CAD (coronary artery disease)    a. LHC 5/12:  LAD 20, pLCx 20, pRCA 40, dRCA 40, EF 35%, diff HK  //  b. Myoview 4/16: Overall Impression: High risk stress nuclear study There is no evidence of ischemia. There is severe LV dysfunction. LV Ejection Fraction: 30%. LV Wall Motion: There is global LV hypokinesis.   Marland Kitchen CAP (community acquired pneumonia) 09/2013  . Chronic combined systolic and diastolic CHF (congestive heart failure) (HCC)    a. Echo 4/16:Mild LVH, EF  40-45%, diffuse HK //  b. Echo 8/17: EF 35-40%, diffuse HK, diastolic dysfunction, aortic sclerosis, trivial MR, moderate LAE, normal RVSF, moderate RAE, mild TR, PASP 42 mmHg // c. Echo 4/18: Mild concentric LVH, EF 30-35, normal wall motion, grade 1 diastolic dysfunction, PASP 49  . Cluster headache    "hx; haven't had one in awhile" (01/09/2016)  . COPD (chronic obstructive pulmonary disease) ([place])    [name] 01/09/2016  . History of CVA (cerebrovascular accident)   . Hypertension   . NICM (nonischemic cardiomyopathy) ([place])   . Tobacco abuse     Past Surgical History:  Procedure Laterality Date  . CARDIAC CATHETERIZATION  10/2010   LM normal, LAD with 20% irregularities, LCX with 20%, RCA with 40% prox and 40% distal - EF of 35%  . COLONOSCOPY W/ BIOPSIES AND POLYPECTOMY    . COLONOSCOPY WITH PROPOFOL N/A 09/06/2018   Procedure: COLONOSCOPY WITH PROPOFOL;  Surgeon: Thornton Park, MD;  Location: [place];  Service: Gastroenterology;  Laterality: N/A;  . ENTEROSCOPY N/A 09/28/2018   Procedure: ENTEROSCOPY;  Surgeon: Carol Ada, MD;  Location[facility] ENDOSCOPY;  Service: Endoscopy;  Laterality: N/A;  . ENTEROSCOPY N/A 10/28/2018   Procedure: ENTEROSCOPY;  Surgeon: Thornton Park, MD;  Location: [place];  Service: Gastroenterology;  Laterality: N/A;  . ESOPHAGOGASTRODUODENOSCOPY (EGD) WITH PROPOFOL N/A 09/05/2018   Procedure: ESOPHAGOGASTRODUODENOSCOPY (EGD) WITH PROPOFOL;  Surgeon: Yetta Flock, MD;  Location: [place];  Service: Gastroenterology;  Laterality: N/A;  .  EXCISION MASS HEAD    . GIVENS CAPSULE STUDY N/A 09/06/2018   Procedure: GIVENS CAPSULE STUDY;  Surgeon: Tressia Danas, MD;  Location: [facility] ENDOSCOPY;  Service: Gastroenterology;  Laterality: N/A;  . GIVENS CAPSULE STUDY N/A 09/26/2018   Procedure: GIVENS CAPSULE STUDY;  Surgeon: Beverley Fiedler, MD;  Location: [facility] ENDOSCOPY;  Service: Gastroenterology;  Laterality: N/A;  . HOT HEMOSTASIS N/A 10/28/2018    Procedure: HOT HEMOSTASIS (ARGON PLASMA COAGULATION/BICAP);  Surgeon: Tressia Danas, MD;  Location: [facility] ENDOSCOPY;  Service: Gastroenterology;  Laterality: N/A;  . INCISION AND DRAINAGE PERIRECTAL ABSCESS N/A 06/05/2017   Procedure: IRRIGATION AND DEBRIDEMENT PERIRECTAL ABSCESS;  Surgeon: Andria Meuse, MD;  Location: [facility];  Service: General;  Laterality: N/A;  . VIDEO BRONCHOSCOPY Bilateral 05/08/2016   Procedure: VIDEO BRONCHOSCOPY WITH FLUORO;  Surgeon: Oretha Milch, MD;  Location: [facility] ENDOSCOPY;  Service: Cardiopulmonary;  Laterality: Bilateral;     reports that he has been smoking cigarettes. He has a 94.00 pack-year smoking history. He has never used smokeless tobacco. He reports current alcohol use. He reports that he does not use drugs.  Allergies  Allergen Reactions  . Lisinopril Cough    Family History  Problem Relation Age of Onset  . Heart disease Mother   . Diabetes Mother   . Colon cancer Mother   . Liver cancer Mother   . Cancer Father        type unknown  . Diabetes Sister        x 2  . Diabetes Brother     Prior to Admission medications   Medication Sig Start Date End Date Taking? Authorizing Provider  Aclidinium Bromide (TUDORZA PRESSAIR) 400 MCG/ACT AEPB Inhale 1 puff into the lungs 2 (two) times daily. 01/28/19   Nyoka Cowden, MD  albuterol (PROVENTIL) (2.5 MG/3ML) 0.083% nebulizer solution Take 3 mLs (2.5 mg total) by nebulization every 4 (four) hours as needed for wheezing or shortness of breath. 12/31/18   Nyoka Cowden, MD  albuterol (VENTOLIN HFA) 108 (90 Base) MCG/ACT inhaler Inhale 2 puffs into the lungs every 6 (six) hours as needed for wheezing or shortness of breath. 11/19/18   [name], MD  atorvastatin (LIPITOR) 80 MG tablet Take 1 tablet (80 mg total) by mouth daily. 05/18/19   [name], MD  carvedilol (COREG) 3.125 MG tablet Take 1 tablet (3.125 mg total) by mouth 2 (two) times daily. 05/18/19 05/17/20  [name], MD    cetirizine (ZYRTEC) 10 MG tablet Take 1 tablet (10 mg total) by mouth daily. 05/18/19   [name], MD  ferrous sulfate 325 (65 FE) MG tablet Take 1 tablet (325 mg total) by mouth daily with breakfast. 09/09/18   Danis, Andreas Blower, MD  fluticasone (FLONASE) 50 MCG/ACT nasal spray Place 1 spray into both nostrils daily for 30 days. 11/12/18 03/09/19  Burnadette Pop, MD  furosemide (LASIX) 40 MG tablet Take 1 tablet (40 mg total) by mouth daily. 05/18/19 05/17/20  [name], MD  gabapentin (NEURONTIN) 300 MG capsule Take 2 capsules (600 mg total) by mouth at bedtime. 05/18/19   [name], MD  losartan (COZAAR) 50 MG tablet Take 1 tablet (50 mg total) by mouth daily. 05/18/19   [name], MD  metroNIDAZOLE (FLAGYL) 500 MG tablet Take 1 tablet (500 mg total) by mouth 2 (two) times daily. 05/20/19   [name], MD  Misc. Devices MISC Portable oxygen concentrator. Diagnosis COPD. 12/05/18   [name], MD  nicotine (NICODERM  CQ - DOSED IN MG/24 HOURS) 14 mg/24hr patch Place 1 patch (14 mg total) onto the skin daily. Patient not taking: Reported on 05/18/2019 11/12/18   Burnadette Pop, MD  rivaroxaban (XARELTO) 20 MG TABS tablet Take 1 tablet (20 mg total) by mouth daily with supper. 05/18/19   [name], MD    Physical Exam: Constitutional: Moderately built and nourished. Vitals:   06/11/19 2154 06/11/19 2239 06/11/19 2256 06/11/19 2328  BP: 125/65 (!) 116/58 115/60 117/64  Pulse: (!) 106 (!) 108 (!) 108 (!) 106  Resp: 20 20 20 20   Temp: 99.5 F (37.5 C) (!) 97.3 F (36.3 C) 98.1 F (36.7 C) 98.3 F (36.8 C)  TempSrc: Oral Oral Oral Oral  SpO2: 100% 100% 100% 100%  Weight:      Height:       Eyes: Right eye pending.  No discharge seen.  Anicteric. ENMT: No discharge from the ears eyes nose or mouth. Neck: No mass felt.  No neck rigidity. Respiratory: No rhonchi or crepitations. Cardiovascular: S1-S2 heard. Abdomen: Soft nontender bowel sounds  present. Musculoskeletal: No edema.  No joint effusion. Skin: No rash. Neurologic: Alert awake oriented time place and person moves all extremities. Psychiatric: Appears normal.   Labs on Admission: I have personally reviewed following labs and imaging studies  CBC: Recent Labs  Lab 06/11/19 1825  WBC 7.0  NEUTROABS 5.1  HGB 5.6*  HCT 24.0*  MCV 79.7*  PLT 210   Basic Metabolic Panel: Recent Labs  Lab 06/11/19 1825  NA 141  K 4.8  CL 99  CO2 30  GLUCOSE 119*  BUN 11  CREATININE 0.90  CALCIUM 8.1*  MG 1.9   GFR: Estimated Creatinine Clearance: 97.8 mL/min (by C-G formula based on SCr of 0.9 mg/dL). Liver Function Tests: Recent Labs  Lab 06/11/19 1825  AST 27  ALT 17  ALKPHOS 67  BILITOT 0.5  PROT 6.6  ALBUMIN 3.5   No results for input(s): LIPASE, AMYLASE in the last 168 hours. No results for input(s): AMMONIA in the last 168 hours. Coagulation Profile: No results for input(s): INR, PROTIME in the last 168 hours. Cardiac Enzymes: No results for input(s): CKTOTAL, CKMB, CKMBINDEX, TROPONINI in the last 168 hours. BNP (last 3 results) No results for input(s): PROBNP in the last 8760 hours. HbA1C: No results for input(s): HGBA1C in the last 72 hours. CBG: No results for input(s): GLUCAP in the last 168 hours. Lipid Profile: No results for input(s): CHOL, HDL, LDLCALC, TRIG, CHOLHDL, LDLDIRECT in the last 72 hours. Thyroid Function Tests: No results for input(s): TSH, T4TOTAL, FREET4, T3FREE, THYROIDAB in the last 72 hours. Anemia Panel: No results for input(s): VITAMINB12, FOLATE, FERRITIN, TIBC, IRON, RETICCTPCT in the last 72 hours. Urine analysis:    Component Value Date/Time   COLORURINE YELLOW 07/25/2016 1845   APPEARANCEUR HAZY (A) 07/25/2016 1845   LABSPEC 1.017 07/25/2016 1845   PHURINE 5.0 07/25/2016 1845   GLUCOSEU 50 (A) 07/25/2016 1845   HGBUR SMALL (A) 07/25/2016 1845   BILIRUBINUR NEGATIVE 07/25/2016 1845   KETONESUR NEGATIVE  07/25/2016 1845   PROTEINUR 100 (A) 07/25/2016 1845   NITRITE NEGATIVE 07/25/2016 1845   LEUKOCYTESUR NEGATIVE 07/25/2016 1845   Sepsis Labs: @LABRCNTIP (procalcitonin:4,lacticidven:4) )No results found for this or any previous visit (from the past 240 hour(s)).   Radiological Exams on Admission: DG Chest Portable 1 View  Result Date: 06/11/2019 CLINICAL DATA:  Syncope. EXAM: PORTABLE CHEST 1 VIEW COMPARISON:  November 11, 2018. FINDINGS: Stable  cardiomegaly. No pneumothorax or pleural effusion is noted. Atherosclerosis of thoracic aorta is noted. Both lungs are clear. The visualized skeletal structures are unremarkable. IMPRESSION: Aortic atherosclerosis. No acute cardiopulmonary abnormality seen. Electronically Signed   By: Lupita RaiderJames  Green Jr M.D.   On: 06/11/2019 17:51    EKG: Independently reviewed.  Sinus tachycardia.  Assessment/Plan Principal Problem:   Acute GI bleeding Active Problems:   COPD GOLD III/still smoking    Chronic combined systolic and diastolic CHF (congestive heart failure) (HCC)   Atrial flutter (HCC)   Syncope    1. Acute GI bleed with previous history of duodenal AVMs in the setting of Xarelto -2 units of PRBC transfusion has been ordered.  Will hold Xarelto.  Patient agrees to holding.  Family also agrees to holding.  Follow CBC on Protonix infusion.  Lower GI was notified. 2. Acute blood loss anemia secondary to GI bleed follow CBC after transfusion. 3. Brief run of nonsustained V. tach was observed by the EMS.  Presently in sinus tachycardia on Coreg.  Will check troponin and magnesium and metabolic panel with next blood draw. 4. Possible syncope likely from weakness from anemia.  However EMS did note some nonsustained V. tach.  Will closely monitor and telemetry and also follow cardiac markers magnesium levels and metabolic panel. 5. Chronic combined systolic and diastolic CHF presently receiving fluids.  Not short of breath. 6. COPD not actively  wheezing. 7. History of CAD denies any chest pain. 8. Right eye cataract surgery eyedrops were discussed with pharmacy and they will be dosing it. 9. Hypertension presently on Coreg and as needed IV hydralazine.  Given that patient has acute GI bleed requiring at least 2 units of PRBC and may require more given that patient is on Xarelto will need close monitoring for any further titration in inpatient status.   DVT prophylaxis: SCDs due to GI bleed. Code Status: Full code. Family Communication: Patient's daughter. Disposition Plan: Home. Consults called: [facility] was notified by ER physician. Admission status: Inpatient.   Eduard ClosArshad N Truong Delcastillo MD [facility] Pager [phone].  If 7PM-7AM, please contact night-coverage [email or web address] Password [email or web address]  06/11/2019, 11:46 PM

## 2019-06-11 NOTE — ED Notes (Signed)
Zoll at bedside. Previous RN reported EMS had 1 run of VT. Per RN no other runs of VT noted.

## 2019-06-11 NOTE — ED Triage Notes (Signed)
Pt here from home with c/o near syncopal episode , pt had cataracts surgery this morning , pt has been having these near syncopal episodes according to family , ems called out for fall , ems witnessed a run of V tach , pt is on 3 liters otc , still smokes

## 2019-06-11 NOTE — Progress Notes (Signed)
Patient arrived to the unit, difficult to awake but once awake patient alert and oriented.  Agreed to majority of assessment and answered some questions.  Patient not feeling well enough to complete admission history.  Patient placed in low bed due to syncope history, placed on telemetry (currently ST at 107), and call bell placed within reach, bed alarm on. First unit of blood currently infusing.

## 2019-06-12 DIAGNOSIS — R195 Other fecal abnormalities: Secondary | ICD-10-CM

## 2019-06-12 DIAGNOSIS — D500 Iron deficiency anemia secondary to blood loss (chronic): Secondary | ICD-10-CM

## 2019-06-12 DIAGNOSIS — K5520 Angiodysplasia of colon without hemorrhage: Secondary | ICD-10-CM

## 2019-06-12 DIAGNOSIS — R55 Syncope and collapse: Secondary | ICD-10-CM

## 2019-06-12 DIAGNOSIS — K922 Gastrointestinal hemorrhage, unspecified: Secondary | ICD-10-CM

## 2019-06-12 LAB — CBC
HCT: 27.5 % — ABNORMAL LOW (ref 39.0–52.0)
Hemoglobin: 6.8 g/dL — CL (ref 13.0–17.0)
MCH: 20.6 pg — ABNORMAL LOW (ref 26.0–34.0)
MCHC: 24.7 g/dL — ABNORMAL LOW (ref 30.0–36.0)
MCV: 83.3 fL (ref 80.0–100.0)
Platelets: 167 10*3/uL (ref 150–400)
RBC: 3.3 MIL/uL — ABNORMAL LOW (ref 4.22–5.81)
RDW: 19.7 % — ABNORMAL HIGH (ref 11.5–15.5)
WBC: 8.8 10*3/uL (ref 4.0–10.5)
nRBC: 1.4 % — ABNORMAL HIGH (ref 0.0–0.2)

## 2019-06-12 LAB — SARS CORONAVIRUS 2 (TAT 6-24 HRS): SARS Coronavirus 2: NEGATIVE

## 2019-06-12 LAB — BASIC METABOLIC PANEL
Anion gap: 12 (ref 5–15)
BUN: 11 mg/dL (ref 8–23)
CO2: 33 mmol/L — ABNORMAL HIGH (ref 22–32)
Calcium: 8.2 mg/dL — ABNORMAL LOW (ref 8.9–10.3)
Chloride: 96 mmol/L — ABNORMAL LOW (ref 98–111)
Creatinine, Ser: 0.9 mg/dL (ref 0.61–1.24)
GFR calc Af Amer: >60 mL/min (ref >60)
GFR calc non Af Amer: >60 mL/min (ref >60)
Glucose, Bld: 110 mg/dL — ABNORMAL HIGH (ref 70–99)
Potassium: 4.7 mmol/L (ref 3.5–5.1)
Sodium: 141 mmol/L (ref 135–145)

## 2019-06-12 LAB — COMPREHENSIVE METABOLIC PANEL
ALT: 22 U/L (ref 0–44)
AST: 32 U/L (ref 15–41)
Albumin: 3.5 g/dL (ref 3.5–5.0)
Alkaline Phosphatase: 66 U/L (ref 38–126)
Anion gap: 7 (ref 5–15)
BUN: 11 mg/dL (ref 8–23)
CO2: 35 mmol/L — ABNORMAL HIGH (ref 22–32)
Calcium: 8.3 mg/dL — ABNORMAL LOW (ref 8.9–10.3)
Chloride: 95 mmol/L — ABNORMAL LOW (ref 98–111)
Creatinine, Ser: 0.8 mg/dL (ref 0.61–1.24)
GFR calc Af Amer: >60 mL/min (ref >60)
GFR calc non Af Amer: >60 mL/min (ref >60)
Glucose, Bld: 157 mg/dL — ABNORMAL HIGH (ref 70–99)
Potassium: 4.5 mmol/L (ref 3.5–5.1)
Sodium: 137 mmol/L (ref 135–145)
Total Bilirubin: 1.5 mg/dL — ABNORMAL HIGH (ref 0.3–1.2)
Total Protein: 6.2 g/dL — ABNORMAL LOW (ref 6.5–8.1)

## 2019-06-12 LAB — HEMOGLOBIN AND HEMATOCRIT, BLOOD
HCT: 31.2 % — ABNORMAL LOW (ref 39.0–52.0)
Hemoglobin: 8.5 g/dL — ABNORMAL LOW (ref 13.0–17.0)

## 2019-06-12 LAB — MAGNESIUM: Magnesium: 2.1 mg/dL (ref 1.7–2.4)

## 2019-06-12 LAB — GLUCOSE, CAPILLARY
Glucose-Capillary: 111 mg/dL — ABNORMAL HIGH (ref 70–99)
Glucose-Capillary: 117 mg/dL — ABNORMAL HIGH (ref 70–99)
Glucose-Capillary: 150 mg/dL — ABNORMAL HIGH (ref 70–99)
Glucose-Capillary: 89 mg/dL (ref 70–99)
Glucose-Capillary: 96 mg/dL (ref 70–99)

## 2019-06-12 LAB — TROPONIN I (HIGH SENSITIVITY): Troponin I (High Sensitivity): 34 ng/L — ABNORMAL HIGH (ref <18)

## 2019-06-12 LAB — PREPARE RBC (CROSSMATCH)

## 2019-06-12 MED ORDER — LOTEPREDNOL ETABONATE 0.5 % OP SUSP
1.0000 [drp] | Freq: Four times a day (QID) | OPHTHALMIC | Status: DC
  Administered 2019-06-12 – 2019-06-15 (×13): 1 [drp] via OPHTHALMIC
  Filled 2019-06-12: qty 5

## 2019-06-12 MED ORDER — SODIUM CHLORIDE 0.9% IV SOLUTION: Freq: Once | INTRAVENOUS | Status: DC

## 2019-06-12 MED ORDER — BESIFLOXACIN HCL 0.6 % OP SUSP
1.0000 [drp] | Freq: Two times a day (BID) | OPHTHALMIC | Status: DC
  Administered 2019-06-12 – 2019-06-15 (×7): 1 [drp] via OPHTHALMIC
  Filled 2019-06-12: qty 5

## 2019-06-12 MED ORDER — BROMFENAC SODIUM 0.07 % OP SOLN
1.0000 [drp] | Freq: Every day | OPHTHALMIC | Status: DC
  Administered 2019-06-12 – 2019-06-15 (×4): 1 [drp] via OPHTHALMIC
  Filled 2019-06-12: qty 1

## 2019-06-12 MED ORDER — CARVEDILOL 3.125 MG PO TABS
3.1250 mg | ORAL_TABLET | Freq: Once | ORAL | Status: AC
  Administered 2019-06-12: 3.125 mg via ORAL
  Filled 2019-06-12: qty 1

## 2019-06-12 MED ORDER — TRAMADOL HCL 50 MG PO TABS
50.0000 mg | ORAL_TABLET | Freq: Once | ORAL | Status: AC
  Administered 2019-06-12: 22:00:00 50 mg via ORAL
  Filled 2019-06-12: qty 1

## 2019-06-12 NOTE — Progress Notes (Signed)
Hgb 6.8.  Provider notified.

## 2019-06-12 NOTE — Progress Notes (Signed)
Orders placed for patient personal eye drops.  Per pharmacy patient can keep eye drops at bedside and scanned by nurse on the [facility].

## 2019-06-12 NOTE — Progress Notes (Signed)
Back-charting, re-activation/completion of the blood transfusions was done to "complete" transfusions sufficiently enough to release the new units ordered.   A.Tobias-Diakun,RN [facility]

## 2019-06-12 NOTE — Evaluation (Signed)
Physical Therapy Evaluation Patient Details Name: Jared [name] MRN: [id number] DOB: 01-11-1956 Today's Date: 06/12/2019   History of Present Illness  64 yo male admitted to ED on 12/31 with suspected GIB with symptomatic anemia with fall, s/p cataracts surgery on 12/31. Pt with multiple past admissions for similar diagnosis. PMH includes COPD on 3LO2 chronically, CAD, a flutter, CHF, CVA, HTN, NICM.  Clinical Impression   Pt presents with generalized weakness and fatigue, increased time and effort to mobilize, mild unsteadiness in standing, and decreased activity tolerance. Pt to benefit from acute PT to address deficits. Pt performed bed mobility and transfer to recliner at bedside with min guard assist, pt unable to progress mobility due to fatigue. Based on mobility seen on eval and pt's weakness, PT recommending HHPT. Pt may progress well once hgb is stabilized, PT to reassess next visit. PT to progress mobility as tolerated, and will continue to follow acutely.      Follow Up Recommendations Home health PT;Supervision for mobility/OOB    Equipment Recommendations  None recommended by PT    Recommendations for Other Services       Precautions / Restrictions Precautions Precautions: Fall Restrictions Weight Bearing Restrictions: No      Mobility  Bed Mobility Overal bed mobility: Needs Assistance Bed Mobility: Supine to Sit     Supine to sit: Min guard;HOB elevated     General bed mobility comments: Min guard for safety, pt with use of bedrails to come to sitting.  Transfers Overall transfer level: Needs assistance Equipment used: None Transfers: Sit to/from [employer] Sit to Stand: Min guard Stand pivot transfers: Min guard       General transfer comment: min guard for safety, stood from low bed height with increased time to rise. Pt able to take few steps to get to recliner at bedside, no LOB noted but mild unsteadiness. Pt declines further mobility  due to fatigue.  Ambulation/Gait             General Gait Details: declines further mobility due to fatigue.  Stairs            Wheelchair Mobility    Modified Rankin (Stroke Patients Only)       Balance Overall balance assessment: History of Falls;Mild deficits observed, not formally tested                                           Pertinent Vitals/Pain Pain Assessment: No/denies pain    Home Living Family/patient expects to be discharged to:: Private residence(pt reports living in a boarding house) Living Arrangements: Alone   Type of Home: House Home Access: Stairs to enter   Secretary/administrator of Steps: 3 Home Layout: One level Home Equipment: None Additional Comments: pt reports living in group home, does not require assist with any ADLs or mobility per report    Prior Function Level of Independence: Independent               Hand Dominance   Dominant Hand: Right    Extremity/Trunk Assessment   Upper Extremity Assessment Upper Extremity Assessment: Generalized weakness    Lower Extremity Assessment Lower Extremity Assessment: Generalized weakness    Cervical / Trunk Assessment Cervical / Trunk Assessment: Normal  Communication   Communication: No difficulties  Cognition Arousal/Alertness: Awake/alert Behavior During Therapy: Flat affect Overall Cognitive Status: No family/caregiver present to determine  baseline cognitive functioning                                 General Comments: Increased processing time to answer questions, responds to PT in very short responses.      General Comments      Exercises     Assessment/Plan    PT Assessment Patient needs continued PT services  PT Problem List Decreased strength;Decreased mobility;Decreased activity tolerance;Decreased balance;Decreased knowledge of use of DME;Decreased safety awareness       PT Treatment Interventions DME  instruction;Therapeutic activities;Gait training;Therapeutic exercise;Patient/family education;Stair training;Balance training;Functional mobility training    PT Goals (Current goals can be found in the Care Plan section)  Acute Rehab PT Goals Patient Stated Goal: stop feeling weak PT Goal Formulation: With patient Time For Goal Achievement: 06/26/19 Potential to Achieve Goals: Good    Frequency Min 3X/week   Barriers to discharge Decreased caregiver support      Co-evaluation               AM-PAC PT "6 Clicks" Mobility  Outcome Measure Help needed turning from your back to your side while in a flat bed without using bedrails?: A Little Help needed moving from lying on your back to sitting on the side of a flat bed without using bedrails?: A Little Help needed moving to and from a bed to a chair (including a wheelchair)?: A Little Help needed standing up from a chair using your arms (e.g., wheelchair or bedside chair)?: A Little Help needed to walk in hospital room?: A Little Help needed climbing 3-5 steps with a railing? : A Little 6 Click Score: 18    End of Session   Activity Tolerance: Patient limited by fatigue Patient left: in chair;with chair alarm set;with call bell/phone within reach;with SCD's reapplied Nurse Communication: Mobility status PT Visit Diagnosis: Muscle weakness (generalized) (M62.81);Difficulty in walking, not elsewhere classified (R26.2)    Time: 6546-5035 PT Time Calculation (min) (ACUTE ONLY): 29 min   Charges:   PT Evaluation $PT Eval Low Complexity: 1 Low PT Treatments $Therapeutic Activity: 8-22 mins        Maxmillian Carsey E, PT Acute Rehabilitation Services Pager [phone]  Office [phone]   Cejay Cambre D Elonda Husky 06/12/2019, 5:24 PM

## 2019-06-12 NOTE — Consult Note (Addendum)
Referring Provider: Dr. Midge Minium Primary Care Physician:  [name], MD Primary Gastroenterologist:  Dr. Myrtie Neither   Reason for Consultation: Anemia   HPI: Jared [name] is a 64 y.o. male with a past medical history significant for hypertension, CAD, systolic and diastolic CHF, nonischemic cardiomyopathy, atrial fib/flutter on Xarelto, CVA, COPD on home oxygen 3L, smoker, elevated LFTs, elastography F2/F3 08/2016, epistaxis 10/2018, GIB 08/2018, duodenal AVM. He underwent right cataract surgery yesterday morning. He returned to his surgeon's office later in the afternoon to have his eye rechecked and he had a syncopal episode as he was leaving the office. His family called 911 and he was transported to [facility] ED. EMS witnessed a run of ventricular tachycardia.no further episodes of V. tach since arriving to the ED.  His admission Hg was  HG 5.6  (baseline Hg 8.8 11/10/2018). Three units of PRBCs was ordered. The 3rd unit of PRBCs is infusing at this time. He reported having several episodes of near syncopal episodes at home over the past week.  He had noted mild dyspnea without any chest pain or productive cough.  He reported having heartburn symptoms for the past month without dysphagia.  No upper or lower abdominal pain.  He reports passing normal formed brown bowel movements at least once daily.  No melena or rectal bleeding.  His last dose of Xarelto was taken in the a.m. 12/31.  He denies alcohol or drug use.  He smokes 1/2 pack of cigarettes daily.  He lives in a boardinghouse His sister lives locally.  Mother with a history of colon cancer with liver metastasis.   He has a history of upper GI bleed secondary to small bowel AVMs.He was admitted to [facility] 09/04/2018 with SOB, COPD exacerbation and melenic stools. Admission Hg 4.9. He received 4 units of PRBCs. An EGD 3/27 showed mild esophagitis, a small hiatal hernia and a normal stomach. A givens small bowel capsule endoscopy 3/28 was negative and  a colonoscopy 3/28 was normal. His Hg was 8.0 and his Xarelto was restarted at time of  discharge 3/29. He underwent a follow up televisit with Dr. Myrtie Neither 3/31, at that time he had mild dyspnea with exertion without melena or hematochezia. He was prescribed Ferrous Sulfate 325mg  po bid. Repeat labs were done 4/16 and his Hg was 6.8 with associated SOB with darker stools without frank melena or hematochezia. He was sent directly to [facility] ED for further evaluation. A repeat givens small bowel endoscopy 4/17 showed a duodenal AVM with melenic stool in the distal small bowel. It was unclear if th ebleed site was in the mid to latter part of the duodenum or the proximal jejunum, therefore a small bowel enteroscopy was done 4/19 which was normal. He was discharged home 4/19.   ED course: Na 141. K+ 4.0. Glu 119. BUN 11. Cr. 0.90. Ca 8.1. Anion gap 12. Mg 1.9. Albumin 3.5. AST 27. ALT 17. T. Bili 0.5. BNP 373.4. Troponin 31. WBC 7.0. Hg 5.6. HCT 24. MCV 79.7. PLT 210. SARS Coronavirus 2 negative. FOBT positive.   Chest Xray: Aortic atherosclerosis. No acute cardiopulmonary abnormality seen.  GI Procedures:   10/28/2018 Small Bowel Enteroscopy by Dr. 10/30/2018: Normal esophagus. Normal stomach. Single AVM located 100 cm from the incisors. Treated with APC. No specimens collected  09/28/2018 Small Bowel Enteroscopy by Dr. 09/30/2018: Normal esophagus. Normal stomach. Normal examined duodenum. The examined portion of the jejunum was normal. No specimens collected  Givens capsule endoscopy 09/26/2018: Active bleeding noted  approximately 1 minute after entry into the duodenum. This is the source of the anemia and it appears to be from an AVM. In the distal small bowel there is melenic stool.   Colonoscopy 09/06/2018: The entire examined colon is normal on direct and retroflexion views. Small internal hemorrhoids seen. No specimens collected. No source of GI blood loss anemia identified on this examination    Givens capsule endoscopy 09/06/2018: Negative.  EGD 09/05/2018: Esophagogastric landmarks identified. 2 cm hiatal hernia. Z-line irregular as outlined. Mild esophagitis. Normal stomach. Normal duodenal bulb and second portion of the duodenum.   ECHO 05/02/2018: LV EF 45 - 50%. Past Medical History:  Diagnosis Date  . CAD (coronary artery disease)    a. LHC 5/12:  LAD 20, pLCx 20, pRCA 40, dRCA 40, EF 35%, diff HK  //  b. Myoview 4/16: Overall Impression: High risk stress nuclear study There is no evidence of ischemia. There is severe LV dysfunction. LV Ejection Fraction: 30%. LV Wall Motion: There is global LV hypokinesis.   Marland Kitchen CAP (community acquired pneumonia) 09/2013  . Chronic combined systolic and diastolic CHF (congestive heart failure) (HCC)    a. Echo 4/16:Mild LVH, EF 40-45%, diffuse HK //  b. Echo 8/17: EF 35-40%, diffuse HK, diastolic dysfunction, aortic sclerosis, trivial MR, moderate LAE, normal RVSF, moderate RAE, mild TR, PASP 42 mmHg // c. Echo 4/18: Mild concentric LVH, EF 30-35, normal wall motion, grade 1 diastolic dysfunction, PASP 49  . Cluster headache    "hx; haven't had one in awhile" (01/09/2016)  . COPD (chronic obstructive pulmonary disease) (HCC)    [name] 01/09/2016  . History of CVA (cerebrovascular accident)   . Hypertension   . NICM (nonischemic cardiomyopathy) (HCC)   . Tobacco abuse     Past Surgical History:  Procedure Laterality Date  . CARDIAC CATHETERIZATION  10/2010   LM normal, LAD with 20% irregularities, LCX with 20%, RCA with 40% prox and 40% distal - EF of 35%  . COLONOSCOPY W/ BIOPSIES AND POLYPECTOMY    . COLONOSCOPY WITH PROPOFOL N/A 09/06/2018   Procedure: COLONOSCOPY WITH PROPOFOL;  Surgeon: Tressia Danas, MD;  Location: [facility] ENDOSCOPY;  Service: Gastroenterology;  Laterality: N/A;  . ENTEROSCOPY N/A 09/28/2018   Procedure: ENTEROSCOPY;  Surgeon: Jeani Hawking, MD;  Location: [facility] ENDOSCOPY;  Service: Endoscopy;  Laterality: N/A;   . ENTEROSCOPY N/A 10/28/2018   Procedure: ENTEROSCOPY;  Surgeon: Tressia Danas, MD;  Location: [facility] ENDOSCOPY;  Service: Gastroenterology;  Laterality: N/A;  . ESOPHAGOGASTRODUODENOSCOPY (EGD) WITH PROPOFOL N/A 09/05/2018   Procedure: ESOPHAGOGASTRODUODENOSCOPY (EGD) WITH PROPOFOL;  Surgeon: Benancio Deeds, MD;  Location: [facility] ENDOSCOPY;  Service: Gastroenterology;  Laterality: N/A;  . EXCISION MASS HEAD    . GIVENS CAPSULE STUDY N/A 09/06/2018   Procedure: GIVENS CAPSULE STUDY;  Surgeon: Tressia Danas, MD;  Location: [facility] ENDOSCOPY;  Service: Gastroenterology;  Laterality: N/A;  . GIVENS CAPSULE STUDY N/A 09/26/2018   Procedure: GIVENS CAPSULE STUDY;  Surgeon: Beverley Fiedler, MD;  Location: [facility] ENDOSCOPY;  Service: Gastroenterology;  Laterality: N/A;  . HOT HEMOSTASIS N/A 10/28/2018   Procedure: HOT HEMOSTASIS (ARGON PLASMA COAGULATION/BICAP);  Surgeon: Tressia Danas, MD;  Location: [facility] ENDOSCOPY;  Service: Gastroenterology;  Laterality: N/A;  . INCISION AND DRAINAGE PERIRECTAL ABSCESS N/A 06/05/2017   Procedure: IRRIGATION AND DEBRIDEMENT PERIRECTAL ABSCESS;  Surgeon: Andria Meuse, MD;  Location: [facility] OR;  Service: General;  Laterality: N/A;  . VIDEO BRONCHOSCOPY Bilateral 05/08/2016   Procedure: VIDEO BRONCHOSCOPY WITH FLUORO;  Surgeon: Comer Locket  Vassie Loll, MD;  Location: [facility] ENDOSCOPY;  Service: Cardiopulmonary;  Laterality: Bilateral;    Prior to Admission medications   Medication Sig Start Date End Date Taking? Authorizing Provider  BESIVANCE 0.6 % SUSP Place 1 drop into the right eye 2 (two) times daily. 06/11/19  Yes [provider]  LOTEMAX 0.5 % ophthalmic suspension Place 1 drop into the right eye 4 (four) times daily. 06/11/19  Yes [provider]  PROLENSA 0.07 % SOLN Place 1 drop into the right eye daily. 06/11/19  Yes [provider]  Aclidinium Bromide (TUDORZA PRESSAIR) 400 MCG/ACT AEPB Inhale 1 puff into the lungs 2 (two) times daily. 01/28/19    Nyoka Cowden, MD  albuterol (PROVENTIL) (2.5 MG/3ML) 0.083% nebulizer solution Take 3 mLs (2.5 mg total) by nebulization every 4 (four) hours as needed for wheezing or shortness of breath. 12/31/18   Nyoka Cowden, MD  albuterol (VENTOLIN HFA) 108 (90 Base) MCG/ACT inhaler Inhale 2 puffs into the lungs every 6 (six) hours as needed for wheezing or shortness of breath. 11/19/18   [name], MD  atorvastatin (LIPITOR) 80 MG tablet Take 1 tablet (80 mg total) by mouth daily. 05/18/19   [name], MD  carvedilol (COREG) 3.125 MG tablet Take 1 tablet (3.125 mg total) by mouth 2 (two) times daily. 05/18/19 05/17/20  [name], MD  cetirizine (ZYRTEC) 10 MG tablet Take 1 tablet (10 mg total) by mouth daily. 05/18/19   [name], MD  ferrous sulfate 325 (65 FE) MG tablet Take 1 tablet (325 mg total) by mouth daily with breakfast. 09/09/18   Danis, Andreas Blower, MD  fluticasone (FLONASE) 50 MCG/ACT nasal spray Place 1 spray into both nostrils daily for 30 days. 11/12/18 03/09/19  Burnadette Pop, MD  furosemide (LASIX) 40 MG tablet Take 1 tablet (40 mg total) by mouth daily. 05/18/19 05/17/20  [name], MD  gabapentin (NEURONTIN) 300 MG capsule Take 2 capsules (600 mg total) by mouth at bedtime. 05/18/19   [name], MD  losartan (COZAAR) 50 MG tablet Take 1 tablet (50 mg total) by mouth daily. 05/18/19   [name], MD  metroNIDAZOLE (FLAGYL) 500 MG tablet Take 1 tablet (500 mg total) by mouth 2 (two) times daily. 05/20/19   [name], MD  Misc. Devices MISC Portable oxygen concentrator. Diagnosis COPD. 12/05/18   [name], MD  nicotine (NICODERM CQ - DOSED IN MG/24 HOURS) 14 mg/24hr patch Place 1 patch (14 mg total) onto the skin daily. Patient not taking: Reported on 05/18/2019 11/12/18   Burnadette Pop, MD  rivaroxaban (XARELTO) 20 MG TABS tablet Take 1 tablet (20 mg total) by mouth daily with supper. 05/18/19   [name], MD    Current Facility-Administered  Medications  Medication Dose Route Frequency Provider Last Rate Last Admin  . acetaminophen (TYLENOL) tablet 650 mg  650 mg Oral Q6H PRN Eduard Clos, MD       Or  . acetaminophen (TYLENOL) suppository 650 mg  650 mg Rectal Q6H PRN Eduard Clos, MD      . albuterol (PROVENTIL) (2.5 MG/3ML) 0.083% nebulizer solution 2.5 mg  2.5 mg Inhalation Q6H PRN Eduard Clos, MD      . carvedilol (COREG) tablet 3.125 mg  3.125 mg Oral BID WC Eduard Clos, MD      . ondansetron [facility]) tablet 4 mg  4 mg Oral Q6H PRN Eduard Clos, MD       Or  . ondansetron (  ZOFRAN) injection 4 mg  4 mg Intravenous Q6H PRN Eduard Clos, MD      . pantoprazole (PROTONIX) 80 mg in sodium chloride 0.9 % 250 mL (0.32 mg/mL) infusion  8 mg/hr Intravenous Continuous Eduard Clos, MD 25 mL/hr at 06/12/19 0203 8 mg/hr at 06/12/19 0203  . [START ON 06/15/2019] pantoprazole (PROTONIX) injection 40 mg  40 mg Intravenous Q12H Eduard Clos, MD      . umeclidinium bromide (INCRUSE ELLIPTA) 62.5 MCG/INH 1 puff  1 puff Inhalation Daily Eduard Clos, MD        Allergies as of 06/11/2019 - Review Complete 06/11/2019  Allergen Reaction Noted  . Lisinopril Cough 06/29/2014    Family History  Problem Relation Age of Onset  . Heart disease Mother   . Diabetes Mother   . Colon cancer Mother   . Liver cancer Mother   . Cancer Father        type unknown  . Diabetes Sister        x 2  . Diabetes Brother     Social History   Socioeconomic History  . Marital status: Single    Spouse name: Not on file  . Number of children: 1  . Years of education: Not on file  . Highest education level: Not on file  Occupational History  . Occupation: retired  Tobacco Use  . Smoking status: Current Every Day Smoker    Packs/day: 2.00    Years: 47.00    Pack years: 94.00    Types: Cigarettes  . Smokeless tobacco: Never Used  . Tobacco comment: 5 cigs daily(03/09/19)    Substance and Sexual Activity  . Alcohol use: Yes    Alcohol/week: 0.0 standard drinks    Comment: last drink was before xmas  . Drug use: No    Types: Cocaine, Marijuana    Comment: "nothing in 20 years"  . Sexual activity: Not Currently  Other Topics Concern  . Not on file  Social History Narrative   unemployed   Social Determinants of Health   Financial Resource Strain:   . Difficulty of Paying Living Expenses: Not on file  Food Insecurity: No Food Insecurity  . Worried About Programme researcher, broadcasting/film/video in the Last Year: Never true  . Ran Out of Food in the Last Year: Never true  Transportation Needs: No Transportation Needs  . Lack of Transportation (Medical): No  . Lack of Transportation (Non-Medical): No  Physical Activity:   . Days of Exercise per Week: Not on file  . Minutes of Exercise per Session: Not on file  Stress:   . Feeling of Stress : Not on file  Social Connections:   . Frequency of Communication with Friends and Family: Not on file  . Frequency of Social Gatherings with Friends and Family: Not on file  . Attends Religious Services: Not on file  . Active Member of Clubs or Organizations: Not on file  . Attends Banker Meetings: Not on file  . Marital Status: Not on file  Intimate Partner Violence:   . Fear of Current or Ex-Partner: Not on file  . Emotionally Abused: Not on file  . Physically Abused: Not on file  . Sexually Abused: Not on file    Review of Systems: Gen: Denies fever, sweats or chills. No weight loss.  CV: Denies chest pain, palpitations or edema. Resp: Denies cough, shortness of breath of hemoptysis.  GI: See HPI. GU : Denies urinary burning,  blood in urine, increased urinary frequency or incontinence. MS: Denies joint pain, muscles aches or weakness. Derm: Denies rash, itchiness, skin lesions or unhealing ulcers. Psych: Denies depression, anxiety or loss. Heme: Denies bruising, bleeding. Neuro:  Denies headaches, dizziness  or paresthesias. Endo:  Denies any problems with DM, thyroid or adrenal function.  Physical Exam: Vital signs in last 24 hours: Temp:  [97.3 F (36.3 C)-99.5 F (37.5 C)] 99.1 F (37.3 C) (01/01 0157) Pulse Rate:  [102-122] 102 (01/01 0157) Resp:  [16-28] 16 (01/01 0157) BP: (112-140)/(58-97) 116/68 (01/01 0157) SpO2:  [94 %-100 %] 97 % (01/01 0157) Weight:  [86.2 kg] 86.2 kg (01/01 0311) Last BM Date: (pta-pt refuses to answer at this time) General: Alert fatigued appearing 64 year old African-American male in no acute distress. Head:  Normocephalic and atraumatic. Eyes: Right eye with bandage, no scleral icterus appreciated to the left eye. Ears:  Normal auditory acuity. Nose:  No deformity, discharge or lesions. Mouth:  No deformity or lesions.  Dentition intact. Neck:  Supple. Lungs: Breath sounds are tight with expiratory wheezes bilaterally.  No crackles. Heart: Regular rate and rhythm, no murmur. Abdomen: Soft, nontender, no masses or organomegaly.  Positive bowel sounds all 4 quadrants. Rectal: Smear of brown stool in the rectal vault trace heme positive.  Prostate enlarged. Msk:  Symmetrical without gross deformities. . Pulses:  Normal pulses noted. Extremities:  Without clubbing or edema. Neurologic:  Alert and  oriented x4; when sitting up in the bed to auscultate his lungs he had a brief period of generalized twitching without evidence of a true seizure.  No tremors. Skin:  Intact without significant lesions or rashes.. Psych:  Alert and cooperative. Normal mood and affect.  Intake/Output from previous day: 12/31 0701 - 01/01 0700 In: 777.4 [P.O.:120; I.V.:327.4; Blood:330] Out: 300 [Urine:300] Intake/Output this shift: Total I/O In: 777.4 [P.O.:120; I.V.:327.4; Blood:330] Out: 300 [Urine:300]  Lab Results: Recent Labs    06/11/19 1825  WBC 7.0  HGB 5.6*  HCT 24.0*  PLT 210   BMET Recent Labs    06/11/19 1825  NA 141  K 4.8  CL 99  CO2 30    GLUCOSE 119*  BUN 11  CREATININE 0.90  CALCIUM 8.1*   LFT Recent Labs    06/11/19 1825  PROT 6.6  ALBUMIN 3.5  AST 27  ALT 17  ALKPHOS 67  BILITOT 0.5   PT/INR No results for input(s): LABPROT, INR in the last 72 hours. Hepatitis Panel No results for input(s): HEPBSAG, HCVAB, HEPAIGM, HEPBIGM in the last 72 hours.    Studies/Results: DG Chest Portable 1 View  Result Date: 06/11/2019 CLINICAL DATA:  Syncope. EXAM: PORTABLE CHEST 1 VIEW COMPARISON:  November 11, 2018. FINDINGS: Stable cardiomegaly. No pneumothorax or pleural effusion is noted. Atherosclerosis of thoracic aorta is noted. Both lungs are clear. The visualized skeletal structures are unremarkable. IMPRESSION: Aortic atherosclerosis. No acute cardiopulmonary abnormality seen. Electronically Signed   By: Marijo Conception M.D.   On: 06/11/2019 17:51    IMPRESSION/PLAN:  66. 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 Admission Hg 5.6 ( hg 8.8 11/10/2018). BUN 11. Tachycardic with HR 100's.  Run of V. tach witnessed by the EMS.  No further V. tach since admitted to the hospital.  BP stable 110-120/60's.  -agree with 3 units of PRBCs -repeat H/H post transfusion  -CBC  and INR in a.m. -continue Pantoprazole infusion $RemoveBe NPO -Possible small bowel capsule endoscopy tomorrow, further recommendations per Dr. Lavon Paganini -Consider repeat IV iron, last received Feraheme 10/28/2018  2. History of atrial fib/flutter controlled on Carvedilol. Run of V tach witnessed by EMS.  This dose of Xarelto was 12/30 1 in the AM.  3. CHF/CM. LV EF 45 - 50%.  4. COPD on Albuterol neb Q 6hrs.  5.  Family history of colon cancer.  6. Hepatic fibrosis.  Normal LFTs and platelet count.   Jared [name]  06/12/2019, 5:02 AM    Attending physician's note   I have taken a history, examined the patient and  reviewed the chart. I agree with the Advanced Practitioner's note, impression and recommendations.  64 year old male with history of small bowel AVM, chronic anemia admitted with worsening anemia, hemoglobin 5.6 on admission, had a syncopal episode and a run of V. tach during transportation by EMS. Fecal Hemoccult positive but patient denies any melena or rectal bleeding.  Bowel habits at baseline.  Likely etiology of chronic occult GI blood loss is small bowel AVM, it will be helpful to localize the lesions prior to proceeding with enteroscopy. Okay to start clear liquids He is currently getting PRBC transfusion. Will plan for bowel prep tomorrow followed by small bowel video capsule on Sunday N.p.o. after midnight tomorrow  Monitor hemoglobin and transfuse as needed  K. Scherry Ran , MD [phone]

## 2019-06-12 NOTE — Progress Notes (Addendum)
Per MD contact pharmacy about patient eye drops.  Pharmacy called and stated they would need MD orders.  Informed that there is a paper with instructions from patient's cataract surgery this AM. Pharmacy stated they would send someone up to look at the paper.   Per pharmacy, drops can be self administered with orders placed by MD.  MD notified of post op instructions sent with patient.

## 2019-06-12 NOTE — Progress Notes (Signed)
PROGRESS NOTE    Jared [name]  OVF:[id number] DOB: 1956/02/13 DOA: 06/11/2019 PCP: [name], MD  Outpatient Specialists:    Brief Narrative:   Previous GI bleed issues, see GI consult note for full details.   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 Admission Hg 5.6 ( hg 8.8 11/10/2018). BUN 11. Tachycardic with HR 100's.  Run of V. tach witnessed by the EMS.  No further V. tach since admitted to the hospital.  BP stable 110-120/60's.  -to complete 3 units PBRC -repeat H/H post transfusion  -CBC and INR in a.m. -continue Pantoprazole infusion 8mg /hr -NPO -Possible small bowel capsule endoscopy tomorrow, further recommendations per Dr. -Consider repeat IV iron, last received Feraheme 10/28/2018  History of atrial fib/flutter  Rate controlled on Carvedilol.  Run of V tach witnessed by EMS.   Last dose of Xarelto was 12/30 1 in the AM Recurrent GI bleeds, might be worth a discussion of whether pt would like to hold off on anticoagulation, will go over this more with patient   HTN: BP at goal   CHF/CM.  LV EF 45 - 50%.  COPD  on Albuterol neb Q 6hrs.  Family history of colon cancer.  Hepatic fibrosis.   Normal LFTs and platelet count.    DVT prophylaxis: SCD Code Status: FULL Family Communication: none thus far today  Disposition Plan: pending clinical improvenet, GI recs   Consultants:   Gastroenterology    Procedures:   Possible scope per GI   Antimicrobials:    Anti-infectives (From admission, onward)   None       Subjective: Pt frustrated he can't eat but is otherwise fine, no CP/SOB   Objective: Vitals:   06/12/19 1033 06/12/19 1058 06/12/19 1248 06/12/19 1330  BP: 118/66 129/67 138/73 134/79  Pulse: 95 97 96 95  Resp: 19 20 19 16   Temp: 99.8 F (37.7 C) 99.5 F (37.5 C) 99.5 F (37.5 C) 98.4 F (36.9 C)  TempSrc: Oral Oral Oral Oral  SpO2: 99% 94% 99% 96%  Weight:      Height:        Intake/Output Summary (Last 24 hours) at 06/12/2019 1351 Last data filed at 06/12/2019 1330 Gross per 24 hour  Intake 1385.73 ml  Output 300 ml  Net 1085.73 ml   Filed Weights   06/11/19 2149 06/12/19 0311  Weight: 86.2 kg 86.2 kg    Examination:  General exam: Appears calm and comfortable  Respiratory system: Clear to auscultation. Respiratory effort normal. Cardiovascular system: S1 & S2 heard, RRR. No JVD, murmurs, rubs, gallops or clicks. No pedal edema. Gastrointestinal system: Abdomen is nondistended, soft and nontender. No organomegaly or masses felt. Normal bowel sounds heard. Central nervous system: Alert and oriented. No focal neurological deficits. Extremities: Symmetric 5 x 5 power. Skin: No rashes, lesions or ulcers Psychiatry: Judgement and insight appear normal. Mood & affect appropriate.     Data Reviewed: I have personally reviewed following labs and imaging studies  CBC: Recent  Labs  Lab 06/11/19 1825 06/12/19 0441  WBC 7.0 8.8  NEUTROABS 5.1  --   HGB 5.6* 6.8*  HCT 24.0* 27.5*  MCV 79.7* 83.3  PLT 210 350   Basic Metabolic Panel: Recent Labs  Lab 06/11/19 1825 06/12/19 0441  NA 141 141  K 4.8 4.7  CL 99 96*  CO2 30 33*  GLUCOSE 119* 110*  BUN 11 11  CREATININE 0.90 0.90  CALCIUM 8.1* 8.2*  MG 1.9 2.1   GFR: Estimated Creatinine Clearance: 97.8 mL/min (by C-G formula based on SCr of 0.9 mg/dL). Liver Function Tests: Recent Labs  Lab 06/11/19 1825  AST 27  ALT 17  ALKPHOS 67  BILITOT 0.5   PROT 6.6  ALBUMIN 3.5   No results for input(s): LIPASE, AMYLASE in the last 168 hours. No results for input(s): AMMONIA in the last 168 hours. Coagulation Profile: No results for input(s): INR, PROTIME in the last 168 hours. Cardiac Enzymes: No results for input(s): CKTOTAL, CKMB, CKMBINDEX, TROPONINI in the last 168 hours. BNP (last 3 results) No results for input(s): PROBNP in the last 8760 hours. HbA1C: No results for input(s): HGBA1C in the last 72 hours. CBG: Recent Labs  Lab 06/12/19 0016 06/12/19 0626 06/12/19 1112  GLUCAP 150* 117* 96   Lipid Profile: No results for input(s): CHOL, HDL, LDLCALC, TRIG, CHOLHDL, LDLDIRECT in the last 72 hours. Thyroid Function Tests: No results for input(s): TSH, T4TOTAL, FREET4, T3FREE, THYROIDAB in the last 72 hours. Anemia Panel: No results for input(s): VITAMINB12, FOLATE, FERRITIN, TIBC, IRON, RETICCTPCT in the last 72 hours. Urine analysis:    Component Value Date/Time   COLORURINE YELLOW 07/25/2016 1845   APPEARANCEUR HAZY (A) 07/25/2016 1845   LABSPEC 1.017 07/25/2016 1845   PHURINE 5.0 07/25/2016 1845   GLUCOSEU 50 (A) 07/25/2016 1845   HGBUR SMALL (A) 07/25/2016 1845   BILIRUBINUR NEGATIVE 07/25/2016 1845   KETONESUR NEGATIVE 07/25/2016 1845   PROTEINUR 100 (A) 07/25/2016 1845   NITRITE NEGATIVE 07/25/2016 1845   LEUKOCYTESUR NEGATIVE 07/25/2016 1845   Sepsis Labs: @LABRCNTIP (procalcitonin:4,lacticidven:4)  ) Recent Results (from the past 240 hour(s))  SARS CORONAVIRUS 2 (TAT 6-24 HRS) Nasopharyngeal Nasopharyngeal Swab     Status: None   Collection Time: 06/11/19  8:43 PM   Specimen: Nasopharyngeal Swab  Result Value Ref Range Status   SARS Coronavirus 2 NEGATIVE NEGATIVE Final    Comment: (NOTE) SARS-CoV-2 target nucleic acids are NOT DETECTED. The SARS-CoV-2 RNA is generally detectable in upper and lower respiratory specimens during the acute phase of infection. Negative results do not preclude SARS-CoV-2  infection, do not rule out co-infections with other pathogens, and should not be used as the sole basis for treatment or other patient management decisions. Negative results must be combined with clinical observations, patient history, and epidemiological information. The expected result is Negative. Fact Sheet for Patients: SugarRoll.be Fact Sheet for Healthcare Providers: https://www.woods-mathews.com/ This test is not yet approved or cleared by the [place] FDA and  has been authorized for detection and/or diagnosis of SARS-CoV-2 by FDA under an Emergency Use Authorization (EUA). This EUA will remain  in effect (meaning this test can be used) for the duration of the COVID-19 declaration under Section 56 4(b)(1) of the Act, 21 U.S.C. section 360bbb-3(b)(1), unless the authorization is terminated or revoked sooner. Performed at [facility], [place] [address], [place],  [address]          Radiology Studies: DG Chest Portable 1 View  Result  Date: 06/11/2019 CLINICAL DATA:  Syncope. EXAM: PORTABLE CHEST 1 VIEW COMPARISON:  November 11, 2018. FINDINGS: Stable cardiomegaly. No pneumothorax or pleural effusion is noted. Atherosclerosis of thoracic aorta is noted. Both lungs are clear. The visualized skeletal structures are unremarkable. IMPRESSION: Aortic atherosclerosis. No acute cardiopulmonary abnormality seen. Electronically Signed   By: Lupita Raider M.D.   On: 06/11/2019 17:51        Scheduled Meds: . sodium chloride   Intravenous Once  . sodium chloride   Intravenous Once  . Besifloxacin HCl  1 drop Right Eye BID  . Bromfenac Sodium  1 drop Right Eye Daily  . carvedilol  3.125 mg Oral BID WC  . loteprednol  1 drop Right Eye QID  . [START ON 06/15/2019] pantoprazole  40 mg Intravenous Q12H  . umeclidinium bromide  1 puff Inhalation Daily   Continuous Infusions: . pantoprozole (PROTONIX) infusion 8 mg/hr (06/12/19  1347)     LOS: 1 day    Time spent: 35 min    Sunnie Nielsen, DO  06/12/2019, 1:51 PM

## 2019-06-12 NOTE — Plan of Care (Signed)

## 2019-06-13 LAB — BASIC METABOLIC PANEL
Anion gap: 3 — ABNORMAL LOW (ref 5–15)
BUN: 8 mg/dL (ref 8–23)
CO2: 41 mmol/L — ABNORMAL HIGH (ref 22–32)
Calcium: 8.6 mg/dL — ABNORMAL LOW (ref 8.9–10.3)
Chloride: 95 mmol/L — ABNORMAL LOW (ref 98–111)
Creatinine, Ser: 0.79 mg/dL (ref 0.61–1.24)
GFR calc Af Amer: >60 mL/min (ref >60)
GFR calc non Af Amer: >60 mL/min (ref >60)
Glucose, Bld: 128 mg/dL — ABNORMAL HIGH (ref 70–99)
Potassium: 4.1 mmol/L (ref 3.5–5.1)
Sodium: 139 mmol/L (ref 135–145)

## 2019-06-13 LAB — TYPE AND SCREEN
ABO/RH(D): O POS
Antibody Screen: NEGATIVE
Unit division: 0
Unit division: 0
Unit division: 0
Unit division: 0

## 2019-06-13 LAB — BILIRUBIN, FRACTIONATED(TOT/DIR/INDIR)
Bilirubin, Direct: 0.2 mg/dL (ref 0.0–0.2)
Indirect Bilirubin: 0.8 mg/dL (ref 0.3–0.9)
Total Bilirubin: 1 mg/dL (ref 0.3–1.2)

## 2019-06-13 LAB — BPAM RBC
Blood Product Expiration Date: 202101062359
Blood Product Expiration Date: 202101282359
Blood Product Expiration Date: 202101282359
Blood Product Expiration Date: 202101302359
ISSUE DATE / TIME: 202012312002
ISSUE DATE / TIME: 202012312303
ISSUE DATE / TIME: 202101011027
ISSUE DATE / TIME: 202101011418
Unit Type and Rh: 5100
Unit Type and Rh: 5100
Unit Type and Rh: 5100
Unit Type and Rh: 9500

## 2019-06-13 LAB — CBC
HCT: 31.2 % — ABNORMAL LOW (ref 39.0–52.0)
Hemoglobin: 8.3 g/dL — ABNORMAL LOW (ref 13.0–17.0)
MCH: 22.3 pg — ABNORMAL LOW (ref 26.0–34.0)
MCHC: 26.6 g/dL — ABNORMAL LOW (ref 30.0–36.0)
MCV: 83.6 fL (ref 80.0–100.0)
Platelets: 117 10*3/uL — ABNORMAL LOW (ref 150–400)
RBC: 3.73 MIL/uL — ABNORMAL LOW (ref 4.22–5.81)
RDW: 19.3 % — ABNORMAL HIGH (ref 11.5–15.5)
WBC: 7.7 10*3/uL (ref 4.0–10.5)
nRBC: 0.5 % — ABNORMAL HIGH (ref 0.0–0.2)

## 2019-06-13 LAB — HEMOGLOBIN AND HEMATOCRIT, BLOOD
HCT: 30.9 % — ABNORMAL LOW (ref 39.0–52.0)
Hemoglobin: 8.2 g/dL — ABNORMAL LOW (ref 13.0–17.0)

## 2019-06-13 LAB — GLUCOSE, CAPILLARY
Glucose-Capillary: 102 mg/dL — ABNORMAL HIGH (ref 70–99)
Glucose-Capillary: 109 mg/dL — ABNORMAL HIGH (ref 70–99)
Glucose-Capillary: 89 mg/dL (ref 70–99)
Glucose-Capillary: 96 mg/dL (ref 70–99)

## 2019-06-13 LAB — PROTIME-INR
INR: 1.1 (ref 0.8–1.2)
Prothrombin Time: 13.9 seconds (ref 11.4–15.2)

## 2019-06-13 MED ORDER — SODIUM CHLORIDE 0.9 % IV SOLN: INTRAVENOUS | Status: DC

## 2019-06-13 MED ORDER — POLYETHYLENE GLYCOL 3350 17 GM/SCOOP PO POWD
1.0000 | Freq: Once | ORAL | Status: AC
  Administered 2019-06-13: 14:00:00 255 g via ORAL
  Filled 2019-06-13: qty 255

## 2019-06-13 NOTE — TOC Initial Note (Signed)
Transition of Care [facility]) - Initial/Assessment Note    Patient Details  Name: Jared [name] MRN: [id number] Date of Birth: [birth year]  Transition of Care [facility]) CM/SW Contact:    Lawerance Sabal, RN Phone Number: 06/13/2019, 4:31 PM  Clinical Narrative:         Sherron Monday w patient over th ephone.  He is agreeable to [facility] services and would like to use [facility]. He states that he has home oxygen through [employer].  Referral accepted by [facility]            Expected Discharge Plan: Home w Home Health Services Barriers to Discharge: Continued Medical Work up   Patient Goals and CMS Choice Patient states their goals for this hospitalization and ongoing recovery are:: to go home CMS Medicare.gov Compare Post Acute Care list provided to:: Patient Choice offered to / list presented to : Patient  Expected Discharge Plan and Services Expected Discharge Plan: Home w Home Health Services   Discharge Planning Services: CM Consult                               HH Arranged: PT [facility] Agency: [facility] Date [facility] Agency Contacted: 06/13/19 Time HH Agency Contacted: 1630 Representative spoke with at [facility] Agency: Kandee Keen  Prior Living Arrangements/Services                       Activities of Daily Living   ADL Screening (condition at time of admission) Patient's cognitive ability adequate to safely complete daily activities?: Yes Is the patient deaf or have difficulty hearing?: Yes Does the patient have difficulty seeing, even when wearing glasses/contacts?: Yes(recent cataract surgery 12/31) Does the patient have difficulty concentrating, remembering, or making decisions?: No Patient able to express need for assistance with ADLs?: Yes  Permission Sought/Granted                  Emotional Assessment              Admission diagnosis:  Acute GI bleeding [K92.2] Symptomatic anemia [D64.9] Syncope, unspecified syncope type [R55] Patient Active Problem List   Diagnosis Date  Noted  . Acute GI bleeding 06/11/2019  . Syncope 06/11/2019  . Chronic right maxillary sinusitis 01/29/2019  . Chronic respiratory failure with hypoxia and hypercapnia (HCC) 01/01/2019  . Acute on chronic respiratory failure with hypoxia and hypercapnia (HCC) 11/10/2018  . GI bleed 10/27/2018  . Chronic anticoagulation 09/27/2018  . AV malformation of gastrointestinal tract   . Occult GI bleeding   . Heme positive stool   . Iron deficiency anemia   . Symptomatic anemia 09/04/2018  . Tobacco use disorder 09/04/2018  . Noncompliance 06/14/2017  . Elevated LFTs 08/13/2016  . Chronic respiratory failure with hypoxia (HCC) 07/24/2016  . Atrial flutter (HCC)   . Hepatic congestion 07/02/2016  . Chronic combined systolic and diastolic CHF (congestive heart failure) (HCC) 07/01/2016  . COPD with acute exacerbation (HCC) 06/03/2016  . Prediabetes 05/14/2016  . Hemoptysis 05/06/2016  . COPD GOLD III/still smoking  02/29/2016  . Cigarette smoker 01/09/2016  . CAD (coronary artery disease) 10/07/2013  . 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 Centrilobular emphysema (HCC) 10/04/2013  . Essential hypertension    PCP:  [name], MD Pharmacy:   [facility] - [place], [place] - [place] [address] [place] [address] Phone: [phone] Fax: [phone]  Social Determinants of Health (SDOH) Interventions    Readmission Risk Interventions Readmission Risk Prevention Plan 11/11/2018  Medication Review ([place]) Complete  PCP or Specialist appointment within 3-5 days of discharge Complete  HRI or [place] Complete  SW Recovery Care/Counseling Consult Complete  [place] Not Applicable  Some recent data might be hidden

## 2019-06-13 NOTE — Progress Notes (Signed)
PROGRESS NOTE    Jared [name]  NAT:[id number] DOB: 1955-11-22 DOA: 06/11/2019 PCP: [name], MD  Outpatient Specialists:    Brief Narrative:   Previous GI bleed issues, see GI consult note for full details.   Admitted 06/11/19 for near syncope d/t anemia d/t GI bleed  06/12/19: paitnet has no major concerns other than he doesn't want to be here! Ge got few units PRBC today Today 06/13/19 gastro PA was in the room while I went to see patient, he reluctantly agrees to capsule endoscopy, planning for bowel prep today. He and I had discussion re: risk vs benefit of continuing vs stopping his anticoagulation as he's very frustrated with recurrent bleeds. I asked him to think about this and if let me know whether he wants to stop the Rx while accepting increased stroke risk OR continue the Rx while accepting the increased GI bleed risk. Basketball game was on, he will think about it.   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 Admission Hg 5.6 ( hg 8.8 11/10/2018). BUN 11. Tachycardic with HR 100's.  Run of V. tach witnessed by the EMS.  No further V. tach since admitted to the hospital.  BP stable 110-120/60's.  -s/p 3 units PBRC -CBC and INR in a.m. -continue Pantoprazole infusion 8mg /hr -NPO -agrees to small bowel capsule endoscopy  -Consider repeat IV iron, last received Feraheme 10/28/2018  History of atrial fib/flutter  Rate controlled on Carvedilol.  Run of V tach witnessed by EMS.   Last dose of Xarelto was 12/30 1 in the AM He and I had discussion re: risk vs benefit of continuing vs stopping  his anticoagulation as he's very frustrated with recurrent bleeds. See above   HTN: BP at goal   CHF/CM.  LV EF 45 - 50%.  COPD  on Albuterol neb Q 6hrs. Lungs sound coarse today  Pt advised get up oob!   Family history of colon cancer.  Hepatic fibrosis.   Normal LFTs and platelet count.    DVT prophylaxis: SCD Code Status: FULL Family Communication: none thus far today  Disposition Plan: pending clinical improvenet, GI recs   Consultants:   Gastroenterology   Procedures:   scope per GI   Antimicrobials:  Anti-infectives (From admission, onward)   None       Subjective: Pt frustrated he can't eat but is otherwise fine, no CP/SOB   Objective: Vitals:   06/13/19 0413 06/13/19 0500 06/13/19 0700 06/13/19 1248  BP: 138/76   137/80  Pulse: 81  78 80  Resp: 18  16 20   Temp: 98.3 F (36.8 C)   98.1 F (36.7 C)  TempSrc: Oral   Oral  SpO2: 98%  100% 100%  Weight:  88.9 kg    Height:        Intake/Output Summary (Last 24 hours) at 06/13/2019 1418 Last data filed at 06/13/2019 1249 Gross per 24 hour  Intake 1804 ml  Output 1900 ml  Net -96 ml   Filed Weights   06/11/19 2149 06/12/19 0311 06/13/19 0500  Weight: 86.2 kg 86.2 kg 88.9  kg    Examination:  General exam: Appears calm and comfortable  Respiratory system: Coarse breath sounds bilaterally to auscultation. Respiratory effort normal. Cardiovascular system: S1 & S2 heard, RRR. No JVD, murmurs, rubs, gallops or clicks. No pedal edema. Gastrointestinal system: Abdomen is nondistended, soft and nontender. No organomegaly or masses felt. Normal bowel sounds heard. Central nervous system: Alert and oriented. No focal neurological deficits. Extremities: Symmetric 5 x 5 power. Skin: No rashes, lesions or ulcers Psychiatry: Judgement and insight appear normal. Mood & affect appropriate.     Data Reviewed: I have personally reviewed following labs and imaging studies  CBC: Recent Labs  Lab  06/11/19 1825 06/12/19 0441 06/12/19 1800 06/13/19 0343 06/13/19 1049  WBC 7.0 8.8  --  7.7  --   NEUTROABS 5.1  --   --   --   --   HGB 5.6* 6.8* 8.5* 8.3* 8.2*  HCT 24.0* 27.5* 31.2* 31.2* 30.9*  MCV 79.7* 83.3  --  83.6  --   PLT 210 167  --  117*  --    Basic Metabolic Panel: Recent Labs  Lab 06/11/19 1825 06/12/19 0441 06/12/19 1800 06/13/19 1049  NA 141 141 137 139  K 4.8 4.7 4.5 4.1  CL 99 96* 95* 95*  CO2 30 33* 35* 41*  GLUCOSE 119* 110* 157* 128*  BUN 11 11 11 8   CREATININE 0.90 0.90 0.80 0.79  CALCIUM 8.1* 8.2* 8.3* 8.6*  MG 1.9 2.1  --   --    GFR: Estimated Creatinine Clearance: 110 mL/min (by C-G formula based on SCr of 0.79 mg/dL). Liver Function Tests: Recent Labs  Lab 06/11/19 1825 06/12/19 1800 06/13/19 1049  AST 27 32  --   ALT 17 22  --   ALKPHOS 67 66  --   BILITOT 0.5 1.5* 1.0  PROT 6.6 6.2*  --   ALBUMIN 3.5 3.5  --    No results for input(s): LIPASE, AMYLASE in the last 168 hours. No results for input(s): AMMONIA in the last 168 hours. Coagulation Profile: Recent Labs  Lab 06/13/19 1049  INR 1.1   Cardiac Enzymes: No results for input(s): CKTOTAL, CKMB, CKMBINDEX, TROPONINI in the last 168 hours. BNP (last 3 results) No results for input(s): PROBNP in the last 8760 hours. HbA1C: No results for input(s): HGBA1C in the last 72 hours. CBG: Recent Labs  Lab 06/12/19 1623 06/12/19 2154 06/13/19 0011 06/13/19 0621 06/13/19 1245  GLUCAP 89 111* 96 102* 89   Lipid Profile: No results for input(s): CHOL, HDL, LDLCALC, TRIG, CHOLHDL, LDLDIRECT in the last 72 hours. Thyroid Function Tests: No results for input(s): TSH, T4TOTAL, FREET4, T3FREE, THYROIDAB in the last 72 hours. Anemia Panel: No results for input(s): VITAMINB12, FOLATE, FERRITIN, TIBC, IRON, RETICCTPCT in the last 72 hours. Urine analysis:    Component Value Date/Time   COLORURINE YELLOW 07/25/2016 1845   APPEARANCEUR HAZY (A) 07/25/2016 1845   LABSPEC 1.017  07/25/2016 1845   PHURINE 5.0 07/25/2016 1845   GLUCOSEU 50 (A) 07/25/2016 1845   HGBUR SMALL (A) 07/25/2016 1845   BILIRUBINUR NEGATIVE 07/25/2016 1845   KETONESUR NEGATIVE 07/25/2016 1845   PROTEINUR 100 (A) 07/25/2016 1845   NITRITE NEGATIVE 07/25/2016 1845   LEUKOCYTESUR NEGATIVE 07/25/2016 1845   Sepsis Labs: @LABRCNTIP (procalcitonin:4,lacticidven:4)  ) Recent Results (from the past 240 hour(s))  SARS CORONAVIRUS 2 (TAT 6-24 HRS) Nasopharyngeal Nasopharyngeal Swab     Status: None   Collection Time: 06/11/19  8:43 PM   Specimen:  Nasopharyngeal Swab  Result Value Ref Range Status   SARS Coronavirus 2 NEGATIVE NEGATIVE Final    Comment: (NOTE) SARS-CoV-2 target nucleic acids are NOT DETECTED. The SARS-CoV-2 RNA is generally detectable in upper and lower respiratory specimens during the acute phase of infection. Negative results do not preclude SARS-CoV-2 infection, do not rule out co-infections with other pathogens, and should not be used as the sole basis for treatment or other patient management decisions. Negative results must be combined with clinical observations, patient history, and epidemiological information. The expected result is Negative. Fact Sheet for Patients: HairSlick.no Fact Sheet for Healthcare Providers: quierodirigir.com This test is not yet approved or cleared by the [place] FDA and  has been authorized for detection and/or diagnosis of SARS-CoV-2 by FDA under an Emergency Use Authorization (EUA). This EUA will remain  in effect (meaning this test can be used) for the duration of the COVID-19 declaration under Section 56 4(b)(1) of the Act, 21 U.S.C. section 360bbb-3(b)(1), unless the authorization is terminated or revoked sooner. Performed at [facility], [address] [place], [place] [address]          Radiology Studies: DG Chest Portable 1 View  Result Date:  06/11/2019 CLINICAL DATA:  Syncope. EXAM: PORTABLE CHEST 1 VIEW COMPARISON:  November 11, 2018. FINDINGS: Stable cardiomegaly. No pneumothorax or pleural effusion is noted. Atherosclerosis of thoracic aorta is noted. Both lungs are clear. The visualized skeletal structures are unremarkable. IMPRESSION: Aortic atherosclerosis. No acute cardiopulmonary abnormality seen. Electronically Signed   By: Lupita Raider M.D.   On: 06/11/2019 17:51        Scheduled Meds: . sodium chloride   Intravenous Once  . sodium chloride   Intravenous Once  . Besifloxacin HCl  1 drop Right Eye BID  . Bromfenac Sodium  1 drop Right Eye Daily  . carvedilol  3.125 mg Oral BID WC  . loteprednol  1 drop Right Eye QID  . [START ON 06/15/2019] pantoprazole  40 mg Intravenous Q12H  . umeclidinium bromide  1 puff Inhalation Daily   Continuous Infusions: . pantoprozole (PROTONIX) infusion 8 mg/hr (06/13/19 1152)     LOS: 2 days    Time spent: 25 min    Sunnie Nielsen, DO  06/13/2019, 2:18 PM

## 2019-06-13 NOTE — Progress Notes (Addendum)
[facility] Progress Note  CC:  Anemia   Subjective: He denies having any N/V. No abdominal pain. No BM today. He is tolerating a clear liquid diet.   Objective:  Vital signs in last 24 hours: Temp:  [98.3 F (36.8 C)-99.8 F (37.7 C)] 98.3 F (36.8 C) (01/02 0413) Pulse Rate:  [81-102] 81 (01/02 0413) Resp:  [16-20] 18 (01/02 0413) BP: (112-139)/(61-84) 138/76 (01/02 0413) SpO2:  [89 %-100 %] 98 % (01/02 0413) FiO2 (%):  [3 %] 3 % (01/01 0726) Weight:  [88.9 kg] 88.9 kg (01/02 0500) Last BM Date: 06/11/19 General: 64 year old male alert in no acute distress. Eyes: Bandage over the right eye, sclera nonicteric. Heart: Irregular rhythm, no murmur. Pulm: Few inspiratory and expiratory wheezes throughout, decreased breath sounds in the bases bilaterally. Abdomen: Soft, nontender, positive bowel sounds to all 4 quadrants, no HSM.  Positive bowel sounds to all 4 quadrants. Extremities:  Without edema. Neurologic:  Alert and  oriented x4;  grossly normal neurologically. Psych:  Alert and cooperative. Normal mood and affect.  Intake/Output from previous day: 01/01 0701 - 01/02 0700 In: 1632.3 [P.O.:620; Blood:1012.3] Out: 1025 [Urine:1025] Intake/Output this shift: Total I/O In: 480 [P.O.:480] Out: 600 [Urine:600]  Lab Results: Recent Labs    06/11/19 1825 06/12/19 0441 06/12/19 1800 06/13/19 0343  WBC 7.0 8.8  --  7.7  HGB 5.6* 6.8* 8.5* 8.3*  HCT 24.0* 27.5* 31.2* 31.2*  PLT 210 167  --  PENDING   BMET Recent Labs    06/11/19 1825 06/12/19 0441 06/12/19 1800  NA 141 141 137  K 4.8 4.7 4.5  CL 99 96* 95*  CO2 30 33* 35*  GLUCOSE 119* 110* 157*  BUN 11 11 11   CREATININE 0.90 0.90 0.80  CALCIUM 8.1* 8.2* 8.3*   LFT Recent Labs    06/12/19 1800  PROT 6.2*  ALBUMIN 3.5  AST 32  ALT 22  ALKPHOS 66  BILITOT 1.5*   PT/INR No results for input(s): LABPROT, INR in the last 72 hours. Hepatitis Panel No results for input(s): HEPBSAG,  HCVAB, HEPAIGM, HEPBIGM in the last 72 hours.  DG Chest Portable 1 View  Result Date: 06/11/2019 CLINICAL DATA:  Syncope. EXAM: PORTABLE CHEST 1 VIEW COMPARISON:  November 11, 2018. FINDINGS: Stable cardiomegaly. No pneumothorax or pleural effusion is noted. Atherosclerosis of thoracic aorta is noted. Both lungs are clear. The visualized skeletal structures are unremarkable. IMPRESSION: Aortic atherosclerosis. No acute cardiopulmonary abnormality seen. Electronically Signed   By: November 13, 2018 M.D.   On: 06/11/2019 17:51    Assessment / Plan:  54. 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 Admission Hg 5.6 (Hg 8.8 11/10/2018). He received 3 units of PRBCs 1/1. Post transfusion Hg 8.5. Today Hg 8.3.  HCT 31.2.  Repeat Hg at 10 AM 8.2.  INR 1.1. -Miralax bowel prep today for [facility] small bowel capsule endoscopy 1/3.  Benefits and risks of the capsule endoscopy discussed.  Patient questions if the capsule endoscopy will be covered by his insurance. -Clear liquid diet -NPO after midnight -continue Pantoprazole gtt   2. History of atrial fib/flutter controlled on Carvedilol. Run of V tach witnessed by EMS.  This dose of Xarelto was 12/30 in the AM.  No further evidence of V. tach.  3. CHF/CM. LV EF 45 - 50%.  4. COPD on  Albuterol neb Q 6hrs.  5.  Family history of colon cancer.  6. Hepatic fibrosis.  Normal LFTs and platelet count. T. Bili level elevated today 1.5.    Further recommendations per Dr. Silverio Decamp  Principal Problem:   Acute GI bleeding Active Problems:   COPD GOLD III/still smoking    Chronic combined systolic and diastolic CHF (congestive heart failure) (HCC)   Atrial flutter ([place])   Syncope     LOS: 2 days   Noralyn Pick  06/13/2019, 5:34 AM    Attending physician's note   I have taken an interval history, reviewed the chart and  examined the patient. I agree with the Advanced Practitioner's note, impression and recommendations.   Hgb stable after transfusion  Sub acute occult GI bleed, likely etiology small bowel AVM Plan for small bowel capsule endoscopy tomorrow to localize site of GI blood loss/AVM's Continue clears Bowel prep this afternoon NPO after midnight  K. Denzil Magnuson , MD [phone]

## 2019-06-14 ENCOUNTER — Encounter (HOSPITAL_COMMUNITY)
Admission: EM | Disposition: A | Discharge status: Home Health | Payer: Self-pay | Source: Home / Self Care | Attending: Osteopathic Medicine

## 2019-06-14 ENCOUNTER — Encounter (HOSPITAL_COMMUNITY): Payer: Self-pay | Admitting: Internal Medicine

## 2019-06-14 HISTORY — PX: GIVENS CAPSULE STUDY: SHX5432

## 2019-06-14 LAB — BASIC METABOLIC PANEL
Anion gap: 6 (ref 5–15)
BUN: <5 mg/dL — ABNORMAL LOW (ref 8–23)
CO2: 38 mmol/L — ABNORMAL HIGH (ref 22–32)
Calcium: 8.5 mg/dL — ABNORMAL LOW (ref 8.9–10.3)
Chloride: 96 mmol/L — ABNORMAL LOW (ref 98–111)
Creatinine, Ser: 0.63 mg/dL (ref 0.61–1.24)
GFR calc Af Amer: >60 mL/min (ref >60)
GFR calc non Af Amer: >60 mL/min (ref >60)
Glucose, Bld: 95 mg/dL (ref 70–99)
Potassium: 3.9 mmol/L (ref 3.5–5.1)
Sodium: 140 mmol/L (ref 135–145)

## 2019-06-14 LAB — GLUCOSE, CAPILLARY
Glucose-Capillary: 104 mg/dL — ABNORMAL HIGH (ref 70–99)
Glucose-Capillary: 105 mg/dL — ABNORMAL HIGH (ref 70–99)
Glucose-Capillary: 90 mg/dL (ref 70–99)
Glucose-Capillary: 99 mg/dL (ref 70–99)

## 2019-06-14 LAB — CBC
HCT: 30.2 % — ABNORMAL LOW (ref 39.0–52.0)
Hemoglobin: 8.3 g/dL — ABNORMAL LOW (ref 13.0–17.0)
MCH: 22.6 pg — ABNORMAL LOW (ref 26.0–34.0)
MCHC: 27.5 g/dL — ABNORMAL LOW (ref 30.0–36.0)
MCV: 82.3 fL (ref 80.0–100.0)
Platelets: 109 10*3/uL — ABNORMAL LOW (ref 150–400)
RBC: 3.67 MIL/uL — ABNORMAL LOW (ref 4.22–5.81)
RDW: 20.6 % — ABNORMAL HIGH (ref 11.5–15.5)
WBC: 7.4 10*3/uL (ref 4.0–10.5)
nRBC: 0 % (ref 0.0–0.2)

## 2019-06-14 SURGERY — IMAGING PROCEDURE, GI TRACT, INTRALUMINAL, VIA CAPSULE
Anesthesia: LOCAL

## 2019-06-14 SURGICAL SUPPLY — 1 items: TOWEL COTTON PACK 4EA (MISCELLANEOUS) ×4 IMPLANT

## 2019-06-14 NOTE — Progress Notes (Signed)
PROGRESS NOTE    Jared [name]  PNT:[id number] DOB: 10/01/1955 DOA: 06/11/2019 PCP: [name], MD  Outpatient Specialists:    Brief Narrative:   Previous GI bleed issues, see GI consult note for full details.   Admitted 06/11/19 for near syncope d/t anemia d/t GI bleed  06/12/19: paitnet has no major concerns other than he doesn't want to be here! Ge got few units PRBC today 06/13/19 gastro PA was in the room while I went to see patient, he reluctantly agrees to capsule endoscopy, planning for bowel prep today. He and I had discussion re: risk vs benefit of continuing vs stopping his anticoagulation as he's very frustrated with recurrent bleeds. I asked him to think about this and if let me know whether he wants to stop the Rx while accepting increased stroke risk OR continue the Rx while accepting the increased GI bleed risk. Basketball game was on, he will think about it.  Today 06/14/19: Ingested PillCam at 10:30 AM, awaiting results.  Trending labs.  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 Admission Hg 5.6 ( hg 8.8 11/10/2018). BUN 11. Tachycardic with HR 100's.  Run of V. tach witnessed by the EMS.  No further V. tach since admitted to the hospital.  BP stable 110-120/60's.  -s/p 3 units PBRC -CBC and INR q a.m. -continue Pantoprazole infusion 8mg /hr -agrees to small bowel capsule endoscopy - this was ingested today, awaiting results / GI recs  -Consider repeat IV iron, last received Feraheme 10/28/2018  History of atrial fib/flutter  -Rate controlled on Carvedilol.  -Run of V tach witnessed  by EMS.   -Last dose of Xarelto was 12/30 1 in the AM -He and I had discussion yesterday 01/02 re: risk vs benefit of continuing vs stopping his anticoagulation as he's very frustrated with recurrent bleeds.  I circled back to this today 01/03, patient states he is fine to continue anticoagulation after consideration of risks versus benefits.  HTN: BP at goal   CHF/CM.  LV EF 45 - 50%.  COPD  on Albuterol neb Q 6hrs. Lungs still sound coarse today  Pt advised get up oob!   Family history of colon cancer.  Hepatic fibrosis.   Normal LFTs and platelet count.    DVT prophylaxis: SCD Code Status: FULL Family Communication: none thus far today  Disposition Plan: pending clinical improvenet, GI recs   Consultants:   Gastroenterology   Procedures:   Pill endoscopy per GI   Antimicrobials:  Anti-infectives (From admission, onward)   None       Subjective: Pt frustrated he can't eat but is otherwise fine, no CP/SOB.   Objective: Vitals:   06/14/19 0819 06/14/19 0833 06/14/19 1129 06/14/19 1133  BP: (!) 148/70  (!) 155/88   Pulse: 83 80 81   Resp: 14 16 17    Temp: 98.5 F (36.9 C)   98.8 F (37.1 C)  TempSrc: Oral   Oral  SpO2: 92% 94% 100%   Weight:      Height:        Intake/Output Summary (Last 24 hours)  at 06/14/2019 1317 Last data filed at 06/14/2019 0411 Gross per 24 hour  Intake 1736.5 ml  Output 2900 ml  Net -1163.5 ml   Filed Weights   06/12/19 0311 06/13/19 0500 06/14/19 0410  Weight: 86.2 kg 88.9 kg 87.9 kg    Examination:  General exam: Appears calm and comfortable  Respiratory system: Coarse breath sounds bilaterally to auscultation. Respiratory effort normal. Cardiovascular system: S1 & S2 heard, RRR. No JVD, murmurs, rubs, gallops or clicks. No pedal edema. Gastrointestinal system: Abdomen is nondistended, soft and nontender. No organomegaly or masses felt. Normal bowel sounds heard. Central nervous system: Alert and oriented. No  focal neurological deficits. Extremities: Symmetric 5 x 5 power. Skin: No rashes, lesions or ulcers Psychiatry: Judgement and insight appear normal. Mood & affect appropriate.     Data Reviewed: I have personally reviewed following labs and imaging studies  CBC: Recent Labs  Lab 06/11/19 1825 06/12/19 0441 06/12/19 1800 06/13/19 0343 06/13/19 1049 06/14/19 0910  WBC 7.0 8.8  --  7.7  --  7.4  NEUTROABS 5.1  --   --   --   --   --   HGB 5.6* 6.8* 8.5* 8.3* 8.2* 8.3*  HCT 24.0* 27.5* 31.2* 31.2* 30.9* 30.2*  MCV 79.7* 83.3  --  83.6  --  82.3  PLT 210 167  --  117*  --  109*   Basic Metabolic Panel: Recent Labs  Lab 06/11/19 1825 06/12/19 0441 06/12/19 1800 06/13/19 1049 06/14/19 0910  NA 141 141 137 139 140  K 4.8 4.7 4.5 4.1 3.9  CL 99 96* 95* 95* 96*  CO2 30 33* 35* 41* 38*  GLUCOSE 119* 110* 157* 128* 95  BUN 11 11 11 8  <5*  CREATININE 0.90 0.90 0.80 0.79 0.63  CALCIUM 8.1* 8.2* 8.3* 8.6* 8.5*  MG 1.9 2.1  --   --   --    GFR: Estimated Creatinine Clearance: 110 mL/min (by C-G formula based on SCr of 0.63 mg/dL). Liver Function Tests: Recent Labs  Lab 06/11/19 1825 06/12/19 1800 06/13/19 1049  AST 27 32  --   ALT 17 22  --   ALKPHOS 67 66  --   BILITOT 0.5 1.5* 1.0  PROT 6.6 6.2*  --   ALBUMIN 3.5 3.5  --    No results for input(s): LIPASE, AMYLASE in the last 168 hours. No results for input(s): AMMONIA in the last 168 hours. Coagulation Profile: Recent Labs  Lab 06/13/19 1049  INR 1.1   Cardiac Enzymes: No results for input(s): CKTOTAL, CKMB, CKMBINDEX, TROPONINI in the last 168 hours. BNP (last 3 results) No results for input(s): PROBNP in the last 8760 hours. HbA1C: No results for input(s): HGBA1C in the last 72 hours. CBG: Recent Labs  Lab 06/13/19 1245 06/13/19 1653 06/13/19 2348 06/14/19 0405 06/14/19 1126  GLUCAP 89 109* 104* 105* 90   Lipid Profile: No results for input(s): CHOL, HDL, LDLCALC, TRIG, CHOLHDL, LDLDIRECT in the  last 72 hours. Thyroid Function Tests: No results for input(s): TSH, T4TOTAL, FREET4, T3FREE, THYROIDAB in the last 72 hours. Anemia Panel: No results for input(s): VITAMINB12, FOLATE, FERRITIN, TIBC, IRON, RETICCTPCT in the last 72 hours. Urine analysis:    Component Value Date/Time   COLORURINE YELLOW 07/25/2016 1845   APPEARANCEUR HAZY (A) 07/25/2016 1845   LABSPEC 1.017 07/25/2016 1845   PHURINE 5.0 07/25/2016 1845   GLUCOSEU 50 (A) 07/25/2016 1845   HGBUR SMALL (A) 07/25/2016 1845   BILIRUBINUR NEGATIVE 07/25/2016  [place] 07/25/2016 1845   PROTEINUR 100 (A) 07/25/2016 1845   NITRITE NEGATIVE 07/25/2016 1845   LEUKOCYTESUR NEGATIVE 07/25/2016 1845   Sepsis Labs: @LABRCNTIP (procalcitonin:4,lacticidven:4)  ) Recent Results (from the past 240 hour(s))  SARS CORONAVIRUS 2 (TAT 6-24 HRS) Nasopharyngeal Nasopharyngeal Swab     Status: None   Collection Time: 06/11/19  8:43 PM   Specimen: Nasopharyngeal Swab  Result Value Ref Range Status   SARS Coronavirus 2 NEGATIVE NEGATIVE Final    Comment: (NOTE) SARS-CoV-2 target nucleic acids are NOT DETECTED. The SARS-CoV-2 RNA is generally detectable in upper and lower respiratory specimens during the acute phase of infection. Negative results do not preclude SARS-CoV-2 infection, do not rule out co-infections with other pathogens, and should not be used as the sole basis for treatment or other patient management decisions. Negative results must be combined with clinical observations, patient history, and epidemiological information. The expected result is Negative. Fact Sheet for Patients: SugarRoll.be Fact Sheet for Healthcare Providers: https://www.woods-mathews.com/ This test is not yet approved or cleared by the [place] FDA and  has been authorized for detection and/or diagnosis of SARS-CoV-2 by FDA under an Emergency Use Authorization (EUA). This EUA will remain    in effect (meaning this test can be used) for the duration of the COVID-19 declaration under Section 56 4(b)(1) of the Act, 21 U.S.C. section 360bbb-3(b)(1), unless the authorization is terminated or revoked sooner. Performed at [facility], [place] [address] [place], [place] [address]          Radiology Studies: No results found.      Scheduled Meds: . sodium chloride   Intravenous Once  . sodium chloride   Intravenous Once  . Besifloxacin HCl  1 drop Right Eye BID  . Bromfenac Sodium  1 drop Right Eye Daily  . carvedilol  3.125 mg Oral BID WC  . loteprednol  1 drop Right Eye QID  . [START ON 06/15/2019] pantoprazole  40 mg Intravenous Q12H  . umeclidinium bromide  1 puff Inhalation Daily   Continuous Infusions: . pantoprozole (PROTONIX) infusion 8 mg/hr (06/13/19 2351)     LOS: 3 days    Time spent: 25 min    Emeterio Reeve, DO  06/14/2019, 1:17 PM

## 2019-06-14 NOTE — Progress Notes (Signed)
Patient ingested pill cam at 1030 am per protocol without complications.  Education given to Architect and patient.  Verbalized understanding.

## 2019-06-15 ENCOUNTER — Encounter (HOSPITAL_COMMUNITY): Payer: Self-pay | Admitting: Internal Medicine

## 2019-06-15 LAB — CBC
HCT: 31.9 % — ABNORMAL LOW (ref 39.0–52.0)
Hemoglobin: 8.5 g/dL — ABNORMAL LOW (ref 13.0–17.0)
MCH: 22.3 pg — ABNORMAL LOW (ref 26.0–34.0)
MCHC: 26.6 g/dL — ABNORMAL LOW (ref 30.0–36.0)
MCV: 83.5 fL (ref 80.0–100.0)
Platelets: 110 10*3/uL — ABNORMAL LOW (ref 150–400)
RBC: 3.82 MIL/uL — ABNORMAL LOW (ref 4.22–5.81)
RDW: 21 % — ABNORMAL HIGH (ref 11.5–15.5)
WBC: 6.8 10*3/uL (ref 4.0–10.5)
nRBC: 0 % (ref 0.0–0.2)

## 2019-06-15 LAB — COMPREHENSIVE METABOLIC PANEL
ALT: 15 U/L (ref 0–44)
AST: 18 U/L (ref 15–41)
Albumin: 3 g/dL — ABNORMAL LOW (ref 3.5–5.0)
Alkaline Phosphatase: 60 U/L (ref 38–126)
Anion gap: 8 (ref 5–15)
BUN: 5 mg/dL — ABNORMAL LOW (ref 8–23)
CO2: 38 mmol/L — ABNORMAL HIGH (ref 22–32)
Calcium: 8.8 mg/dL — ABNORMAL LOW (ref 8.9–10.3)
Chloride: 97 mmol/L — ABNORMAL LOW (ref 98–111)
Creatinine, Ser: 0.74 mg/dL (ref 0.61–1.24)
GFR calc Af Amer: >60 mL/min (ref >60)
GFR calc non Af Amer: >60 mL/min (ref >60)
Glucose, Bld: 84 mg/dL (ref 70–99)
Potassium: 3.9 mmol/L (ref 3.5–5.1)
Sodium: 143 mmol/L (ref 135–145)
Total Bilirubin: 0.7 mg/dL (ref 0.3–1.2)
Total Protein: 5.8 g/dL — ABNORMAL LOW (ref 6.5–8.1)

## 2019-06-15 LAB — IRON AND TIBC
Iron: 15 ug/dL — ABNORMAL LOW (ref 45–182)
Saturation Ratios: 4 % — ABNORMAL LOW (ref 17.9–39.5)
TIBC: 340 ug/dL (ref 250–450)
UIBC: 325 ug/dL

## 2019-06-15 LAB — FERRITIN: Ferritin: 13 ng/mL — ABNORMAL LOW (ref 24–336)

## 2019-06-15 LAB — GLUCOSE, CAPILLARY
Glucose-Capillary: 132 mg/dL — ABNORMAL HIGH (ref 70–99)
Glucose-Capillary: 87 mg/dL (ref 70–99)
Glucose-Capillary: 92 mg/dL (ref 70–99)

## 2019-06-15 MED ORDER — RIVAROXABAN 20 MG PO TABS: 20.0000 mg | ORAL_TABLET | Freq: Every day | ORAL | Status: DC

## 2019-06-15 MED ORDER — PANTOPRAZOLE SODIUM 40 MG PO TBEC: 40.0000 mg | DELAYED_RELEASE_TABLET | Freq: Two times a day (BID) | ORAL | 0 refills | Status: DC

## 2019-06-15 MED ORDER — SODIUM CHLORIDE 0.9 % IV SOLN
510.0000 mg | Freq: Once | INTRAVENOUS | Status: AC
  Administered 2019-06-15: 15:00:00 510 mg via INTRAVENOUS
  Filled 2019-06-15: qty 17

## 2019-06-15 MED ORDER — SODIUM CHLORIDE 0.9 % IV SOLN
510.0000 mg | Freq: Once | INTRAVENOUS | Status: DC
  Filled 2019-06-15: qty 17

## 2019-06-15 NOTE — Progress Notes (Signed)
Patient ID: Jared [name], male   DOB: 08-Sep-1955, 64 y.o.   MRN: [id number]     Progress Note   Subjective    Day # 3  CC ; syncope , anemia  HGB 8.5 post transfusions No Iron studies done  Capsule Endoscopy - shows no active bleeding- positive for esophageal erosions and gastritis . No AVM;s visualized .    Objective   Vital signs in last 24 hours: Temp:  [97.5 F (36.4 C)-98.8 F (37.1 C)] 97.5 F (36.4 C) (01/04 0417) Pulse Rate:  [79-94] 94 (01/04 0417) Resp:  [17-18] 18 (01/04 0417) BP: (135-155)/(88-93) 142/92 (01/04 0417) SpO2:  [98 %-100 %] 98 % (01/04 0417) Weight:  [87 kg] 87 kg (01/04 0417) Last BM Date: 06/14/19 General: older   white male in NAD Heart:  irRegular rate and rhythm; no murmurs Lungs: Respirations even and unlabored, lungs CTA bilaterally Abdomen:  Soft, nontender and nondistended. Normal bowel sounds. Extremities:  Without edema. Neurologic:  Alert and oriented,  grossly normal neurologically. Psych:  Cooperative. Normal mood and affect.  Intake/Output from previous day: 01/03 0701 - 01/04 0700 In: -  Out: 2000 [Urine:2000] Intake/Output this shift: Total I/O In: 360 [P.O.:360] Out: -   Lab Results: Recent Labs    06/13/19 0343 06/13/19 1049 06/14/19 0910 06/15/19 0724  WBC 7.7  --  7.4 6.8  HGB 8.3* 8.2* 8.3* 8.5*  HCT 31.2* 30.9* 30.2* 31.9*  PLT 117*  --  109* 110*   BMET Recent Labs    06/13/19 1049 06/14/19 0910 06/15/19 0724  NA 139 140 143  K 4.1 3.9 3.9  CL 95* 96* 97*  CO2 41* 38* 38*  GLUCOSE 128* 95 84  BUN 8 <5* 5*  CREATININE 0.79 0.63 0.74  CALCIUM 8.6* 8.5* 8.8*   LFT Recent Labs    06/12/19 1800 06/13/19 1049 06/15/19 0724  PROT  --   --  5.8*  ALBUMIN  --   --  3.0*  AST  --   --  18  ALT  --   --  15  ALKPHOS  --   --  60  BILITOT   < > 1.0 0.7  BILIDIR  --  0.2  --   IBILI  --  0.8  --    < > = values in this interval not displayed.   PT/INR Recent Labs    06/13/19 1049  LABPROT  13.9  INR 1.1        Assessment / Plan:    #1 64 yo male with recurrent profound anemia , and heme positive stool in setting of chronic Xarelto  Extensive workup with in the past year   With finding of duodenal AVM which has been treated twice with APC  Capsule endoscopy this admit  Shows esophagea;l erosions and active gastritis , no AVM's visualized    Plan;  Will need serial HGB done as an outpt q 2-3 weeks  Per PCP  Have ordered Iron studies today - give IV Iron if deficient  -before discharge Home on  BID PPI  Chronically  If pt continues to require admissions for anemia  Consider stopping anticoagulation.    Principal Problem:   Acute GI bleeding Active Problems:   COPD GOLD III/still smoking    Chronic combined systolic and diastolic CHF (congestive heart failure) (HCC)   Atrial flutter (HCC)   Syncope     LOS: 4 days   Abrahan Fulmore EsterwoodPA-C  06/15/2019, 10:40 AM

## 2019-06-15 NOTE — TOC Transition Note (Signed)
Transition of Care [facility]) - CM/SW Discharge Note   Patient Details  Name: Jared [name] MRN: [id number] Date of Birth: 04/15/1956  [facility] CM/SW Contact:  Leone Haven, RN Phone Number: 06/15/2019, 1:52 PM   Clinical Narrative:    Patient is for discharge today, NCM notified [facility] with [facility].     Final next level of care: Home w Home Health Services Barriers to Discharge: No Barriers Identified   Patient Goals and CMS Choice Patient states their goals for this hospitalization and ongoing recovery are:: to go home CMS Medicare.gov Compare Post Acute Care list provided to:: Patient Choice offered to / list presented to : Patient  Discharge Placement                       Discharge Plan and Services   Discharge Planning Services: CM Consult                      HH Arranged: PT [facility] Agency: [facility] Date [facility] Agency Contacted: 06/13/19 Time HH Agency Contacted: 1630 Representative spoke with at [facility] Agency: Kandee Keen  Social Determinants of Health (SDOH) Interventions     Readmission Risk Interventions Readmission Risk Prevention Plan 11/11/2018  Medication Review (RN Care Manager) Complete  PCP or Specialist appointment within 3-5 days of discharge Complete  HRI or Home Care Consult Complete  SW Recovery Care/Counseling Consult Complete  Palliative Care Screening Not Applicable  Skilled Nursing Facility Not Applicable  Some recent data might be hidden

## 2019-06-15 NOTE — Progress Notes (Signed)
Physical Therapy Treatment Patient Details Name: Jared [name] MRN: [id number] DOB: 1956-03-14 Today's Date: 06/15/2019    History of Present Illness 64 yo male admitted to ED on 12/31 with suspected GIB with symptomatic anemia with fall, s/p cataracts surgery on 12/31. Pt with multiple past admissions for similar diagnosis. PMH includes COPD on 3LO2 chronically, CAD, a flutter, CHF, CVA, HTN, NICM.    PT Comments    Patient progressing well towards PT goals. Tolerated gait training today with Min guard assist for balance/safety. Sp02 dropped to 80% on 3L/min 02 [place] with 2/4 DOE. Takes a few minutes to recover to >90% with cues for pursed lip breathing. Pt irritated that he has not been discharged yet. Demonstrates some mild unsteadiness but no overt LOB. Declined use of RW. Recommend walking to bathroom with nursing as well as hallway ambulation 2 more times today. Will follow.    Follow Up Recommendations  Home health PT;Supervision for mobility/OOB     Equipment Recommendations  None recommended by PT    Recommendations for Other Services       Precautions / Restrictions Precautions Precautions: Fall Precaution Comments: watch 02 Restrictions Weight Bearing Restrictions: No    Mobility  Bed Mobility Overal bed mobility: Needs Assistance Bed Mobility: Supine to Sit     Supine to sit: Modified independent (Device/Increase time);HOB elevated     General bed mobility comments: No assist needed, use of rail.  Transfers Overall transfer level: Needs assistance Equipment used: None Transfers: Sit to/from Stand Sit to Stand: Min guard;Supervision         General transfer comment: Min guard progressing to supervision for safety. Stood from [employer], from toilet x1, transferred to chair post ambulation.  Ambulation/Gait Ambulation/Gait assistance: Min guard Gait Distance (Feet): 140 Feet Assistive device: None Gait Pattern/deviations: Step-through pattern;Decreased stride  length;Decreased dorsiflexion - right Gait velocity: decreased Gait velocity interpretation: 1.31 - 2.62 ft/sec, indicative of limited community ambulator General Gait Details: Slow, mildly unsteady gait with decreased foot clearance RLE. 2/4 DOE. Sp02 dropped to 80% on 3L/.min 02 [place]. Cues for pursed lip breathing. Takes a few mins to recover.   Stairs             Wheelchair Mobility    Modified Rankin (Stroke Patients Only)       Balance Overall balance assessment: Needs assistance Sitting-balance support: Feet supported;No upper extremity supported Sitting balance-Leahy Scale: Good Sitting balance - Comments: Able to reach outside BoS and adjust socks without LOB.   Standing balance support: During functional activity Standing balance-Leahy Scale: Fair                              Cognition Arousal/Alertness: Awake/alert Behavior During Therapy: Flat affect Overall Cognitive Status: No family/caregiver present to determine baseline cognitive functioning                                 General Comments: Increased processing time to answer questions, responds to PT in very short responses. Irritated that he cannot go home, "i am going home today no matter what."      Exercises      General Comments General comments (skin integrity, edema, etc.): Wearing clear eye patch on right eye due to recent cataract surgery.      Pertinent Vitals/Pain Pain Assessment: No/denies pain    Home Living  Prior Function            PT Goals (current goals can now be found in the care plan section) Progress towards PT goals: Progressing toward goals    Frequency    Min 3X/week      PT Plan Current plan remains appropriate    Co-evaluation              AM-PAC PT "6 Clicks" Mobility   Outcome Measure  Help needed turning from your back to your side while in a flat bed without using bedrails?: A  Little Help needed moving from lying on your back to sitting on the side of a flat bed without using bedrails?: A Little Help needed moving to and from a bed to a chair (including a wheelchair)?: A Little Help needed standing up from a chair using your arms (e.g., wheelchair or bedside chair)?: A Little Help needed to walk in hospital room?: A Little Help needed climbing 3-5 steps with a railing? : A Little 6 Click Score: 18    End of Session Equipment Utilized During Treatment: Gait belt Activity Tolerance: Patient tolerated treatment well Patient left: in chair;with chair alarm set;with call bell/phone within reach Nurse Communication: Mobility status PT Visit Diagnosis: Muscle weakness (generalized) (M62.81);Difficulty in walking, not elsewhere classified (R26.2)     Time: 8502-7741 PT Time Calculation (min) (ACUTE ONLY): 23 min  Charges:  $Gait Training: 8-22 mins $Therapeutic Activity: 8-22 mins                     Vale Haven, PT, DPT Acute Rehabilitation Services Pager [phone] Office [phone]       Blake Divine A Lanier Ensign 06/15/2019, 8:53 AM

## 2019-06-15 NOTE — Discharge Summary (Signed)
Physician Discharge Summary  Washburn VOJ:[id number] DOB: 08-24-1955 DOA: 06/11/2019  PCP: [name], MD  Admit date: 06/11/2019 Discharge date: 06/15/2019  Admitted From: home Disposition:  home  Recommendations for Outpatient Follow-up:  1. Follow up with PCP in 1-2 weeks 2. Please obtain labs/tests: CBC and iron/ferritin, CMP in 1-2 weeks 3. Please follow up on the following pending results: none  Home Health: ordered for PT  Equipment/Devices:none  Discharge Condition:stable  CODE STATUS:FULL  Diet recommendation: Heart Healthy   Brief/Interim Summary: Previous GI bleed issues, see initial GI consult note for full details.   Admitted 06/11/19 for near syncope d/t anemia d/t GI bleed  06/12/19: paitnet has no major concerns other than he doesn't want to be here! Ge got few units PRBC today 06/13/19 gastro PA was in the room while I went to see patient, he reluctantly agrees to capsule endoscopy, planning for bowel prep today. He and I had discussion re: risk vs benefit of continuing vs stopping his anticoagulation as he's very frustrated with recurrent bleeds. I asked him to think about this and if let me know whether he wants to stop the Rx while accepting increased stroke risk OR continue the Rx while accepting the increased GI bleed risk. Basketball game was on, he will think about it.  06/14/19: Ingested PillCam at 10:30 AM, awaiting results.  Trending labs. Today 06/15/19: Pill Endoscopy 06/14/2019: irregular z line noted (no esophagitis), mild gastric erythema. No heme noted anywhere in the stomach or small bowel, otherwise normal exam. No obvious AVMs.IV iron infusion ordered and patient cleared to go home w/ close follow-up.  I had a discussion with patient regarding whether or not he would like to continue anticoagulation given risk/benefits and recurrent GI bleeds.  He states that he would like to continue his Xarelto.   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 BUN 11. Tachycardic with HR 100's.Run of V. tach witnessed by the EMS. No further V. tach since admitted to the hospital. BP stable 110-120/60's.  -s/p 3 units PBRC -agrees to small bowel capsule endoscopy -repeat IV iron, last received Feraheme 10/28/2018  History of atrial fib/flutter -Rate controlled on Carvedilol.  -Run of V tach witnessed by EMS. -He and I had discussion  01/02 re: risk vs benefit of continuing vs stopping his anticoagulation as he's very frustrated with recurrent bleeds.  I circled back to this today 01/03, patient states he is fine to continue anticoagulation after consideration of risks versus benefits.  HTN: BP at goal   CHF/CM.  LV EF 45 - 50%.  COPD  on Albuterol neb Q 6hrs.  Family history of colon cancer.  Hepatic fibrosis. Normal LFTs and platelet count.     Discharge Instructions  Discharge Instructions    Diet - low sodium heart healthy   Complete by: As directed    Diet - low sodium heart healthy   Complete by: As directed    Discharge instructions   Complete by: As directed    Follow up with PCP in 1 week to monitor CBC  Increase activity slowly   Complete by: As directed    Increase activity slowly   Complete by: As directed      Allergies as of 06/15/2019      Reactions   Lisinopril Cough      Medication List    TAKE these medications   albuterol 108 (90 Base) MCG/ACT inhaler Commonly known as: VENTOLIN HFA Inhale 2 puffs into the lungs every 6 (six) hours as needed for wheezing or shortness  of breath.   albuterol (2.5 MG/3ML) 0.083% nebulizer solution Commonly known as: PROVENTIL Take 3 mLs (2.5 mg total) by nebulization every 4 (four) hours as needed for wheezing or shortness of breath.   Besivance 0.6 % Susp Generic drug: Besifloxacin HCl Place 1 drop into the right eye 2 (two) times daily.   carvedilol 3.125 MG tablet Commonly known as: Coreg Take 1 tablet (3.125 mg total) by mouth 2 (two) times daily.   cetirizine 10 MG tablet Commonly known as: ZYRTEC Take 1 tablet (10 mg total) by mouth daily.   fluticasone 50 MCG/ACT nasal spray Commonly known as: FLONASE Place 1 spray into both nostrils daily for 30 days.   furosemide 40 MG tablet Commonly known as: LASIX Take 1 tablet (40 mg total) by mouth daily.   gabapentin 300 MG capsule Commonly known as: NEURONTIN Take 2 capsules (600 mg total) by mouth at bedtime.   losartan 50 MG tablet Commonly known as: COZAAR Take 1 tablet (50 mg total) by mouth daily.   Lotemax 0.5 % ophthalmic suspension Generic drug: loteprednol Place 1 drop into the right eye 4 (four) times daily.   Misc. Devices Misc Portable oxygen concentrator. Diagnosis COPD.   pantoprazole 40 MG tablet Commonly known as: PROTONIX Take 1 tablet (40 mg total) by mouth 2 (two) times daily before a meal. What changed: when to take this   Prolensa 0.07 % Soln Generic drug: Bromfenac Sodium Place 1 drop into the right eye daily.   rivaroxaban 20 MG Tabs tablet Commonly known as: XARELTO Take 1 tablet (20 mg total) by mouth daily with supper.   Tudorza Pressair 400 MCG/ACT Aepb Generic drug: Aclidinium Bromide Inhale 1 puff into the lungs 2 (two) times daily.       Follow-up Information    [facility], [facility] Follow up.   Specialty: Home Health Services Why: For home health services. They will be in contact with you in a few days to set up your first visit Contact information: [address] [place]  [address] [phone]          Allergies  Allergen Reactions  . Lisinopril Cough    Consultations: . Gastroenterology   Procedures/Studies: DG Chest Portable 1 View  Result Date: 06/11/2019 CLINICAL DATA:  Syncope. EXAM: PORTABLE CHEST 1 VIEW COMPARISON:  November 11, 2018. FINDINGS: Stable cardiomegaly. No pneumothorax or pleural effusion is noted. Atherosclerosis of thoracic aorta is noted. Both lungs are clear. The visualized skeletal structures are unremarkable. IMPRESSION: Aortic atherosclerosis. No acute cardiopulmonary abnormality seen. Electronically Signed   By: Lupita Raider M.D.   On: 06/11/2019 17:51  . 3 units PRBC  Pill Endoscopy 06/14/2019: irregular z line noted (no esophagitis), mild gastric erythema. No heme noted anywhere in the stomach or small bowel, otherwise normal exam. No obvious AVMs. He has had multiple EGD / enteroscopies, capsules, and a prior colonoscopy this year. He has heme positive stool but no overt bleeding in the setting of anticoagulation. He has  recurrent iron deficiency despite oral iron.    Subjective: Patient feeling well today, he would like to go home.,  No complaints   Discharge Exam: Vitals:   06/15/19 0417 06/15/19 1137  BP: (!) 142/92 130/77  Pulse: 94 76  Resp: 18 17  Temp: (!) 97.5 F (36.4 C) 98.3 F (36.8 C)  SpO2: 98% 100%   Vitals:   06/14/19 1745 06/14/19 2016 06/15/19 0417 06/15/19 1137  BP: (!) 152/90 (!) 135/93 (!) 142/92 130/77  Pulse: 79 86 94 76  Resp:  18 18 17   Temp:  98.5 F (36.9 C) (!) 97.5 F (36.4 C) 98.3 F (36.8 C)  TempSrc:  Oral Oral Oral  SpO2:  98% 98% 100%  Weight:   87 kg   Height:        General: Pt is alert, awake, not in acute distress Cardiovascular: RRR, S1/S2 +, no rubs, no gallops Respiratory: CTA bilaterally, no wheezing, no rhonchi Abdominal: Soft, NT, ND, bowel sounds + Extremities: no edema, no cyanosis     The results of significant diagnostics from this hospitalization  (including imaging, microbiology, ancillary and laboratory) are listed below for reference.     Microbiology: Recent Results (from the past 240 hour(s))  SARS CORONAVIRUS 2 (TAT 6-24 HRS) Nasopharyngeal Nasopharyngeal Swab     Status: None   Collection Time: 06/11/19  8:43 PM   Specimen: Nasopharyngeal Swab  Result Value Ref Range Status   SARS Coronavirus 2 NEGATIVE NEGATIVE Final    Comment: (NOTE) SARS-CoV-2 target nucleic acids are NOT DETECTED. The SARS-CoV-2 RNA is generally detectable in upper and lower respiratory specimens during the acute phase of infection. Negative results do not preclude SARS-CoV-2 infection, do not rule out co-infections with other pathogens, and should not be used as the sole basis for treatment or other patient management decisions. Negative results must be combined with clinical observations, patient history, and epidemiological information. The expected result is Negative. Fact Sheet for Patients: 06/13/19 Fact Sheet for Healthcare Providers: HairSlick.no This test is not yet approved or cleared by the [place] FDA and  has been authorized for detection and/or diagnosis of SARS-CoV-2 by FDA under an Emergency Use Authorization (EUA). This EUA will remain  in effect (meaning this test can be used) for the duration of the COVID-19 declaration under Section 56 4(b)(1) of the Act, 21 U.S.C. section 360bbb-3(b)(1), unless the authorization is terminated or revoked sooner. Performed at [facility], [address], [place], [place]      Labs: BNP (last 3 results) Recent Labs    09/04/18 1629 11/09/18 0544 06/11/19 1825  BNP 75.2 238.4* 373.4*   Basic Metabolic Panel: Recent Labs  Lab 06/11/19 1825 06/12/19 0441 06/12/19 1800 06/13/19 1049 06/14/19 0910 06/15/19 0724  NA 141 141 137 139 140 143  K 4.8 4.7 4.5 4.1 3.9 3.9  CL 99 96* 95* 95* 96* 97*   CO2 30 33* 35* 41* 38* 38*  GLUCOSE 119* 110* 157* 128* 95 84  BUN 11 11 11 8  <5* 5*  CREATININE 0.90 0.90 0.80 0.79 0.63 0.74  CALCIUM 8.1* 8.2* 8.3* 8.6* 8.5* 8.8*  MG 1.9 2.1  --   --   --   --    Liver Function Tests: Recent Labs  Lab 06/11/19 1825 06/12/19 1800 06/13/19 1049 06/15/19 0724  AST 27 32  --  18  ALT 17 22  --  15  ALKPHOS 67 66  --  60  BILITOT  0.5 1.5* 1.0 0.7  PROT 6.6 6.2*  --  5.8*  ALBUMIN 3.5 3.5  --  3.0*   No results for input(s): LIPASE, AMYLASE in the last 168 hours. No results for input(s): AMMONIA in the last 168 hours. CBC: Recent Labs  Lab 06/11/19 1825 06/12/19 0441 06/12/19 1800 06/13/19 0343 06/13/19 1049 06/14/19 0910 06/15/19 0724  WBC 7.0 8.8  --  7.7  --  7.4 6.8  NEUTROABS 5.1  --   --   --   --   --   --   HGB 5.6* 6.8* 8.5* 8.3* 8.2* 8.3* 8.5*  HCT 24.0* 27.5* 31.2* 31.2* 30.9* 30.2* 31.9*  MCV 79.7* 83.3  --  83.6  --  82.3 83.5  PLT 210 167  --  117*  --  109* 110*   Cardiac Enzymes: No results for input(s): CKTOTAL, CKMB, CKMBINDEX, TROPONINI in the last 168 hours. BNP: Invalid input(s): POCBNP CBG: Recent Labs  Lab 06/14/19 0627 06/14/19 1126 06/15/19 0008 06/15/19 0559 06/15/19 1138  GLUCAP 99 90 92 87 132*   D-Dimer No results for input(s): DDIMER in the last 72 hours. Hgb A1c No results for input(s): HGBA1C in the last 72 hours. Lipid Profile No results for input(s): CHOL, HDL, LDLCALC, TRIG, CHOLHDL, LDLDIRECT in the last 72 hours. Thyroid function studies No results for input(s): TSH, T4TOTAL, T3FREE, THYROIDAB in the last 72 hours.  Invalid input(s): FREET3 Anemia work up Recent Labs    06/15/19 1112  FERRITIN 13*  TIBC 340  IRON 15*   Urinalysis    Component Value Date/Time   COLORURINE YELLOW 07/25/2016 1845   APPEARANCEUR HAZY (A) 07/25/2016 1845   LABSPEC 1.017 07/25/2016 1845   PHURINE 5.0 07/25/2016 1845   GLUCOSEU 50 (A) 07/25/2016 1845   HGBUR SMALL (A) 07/25/2016 1845    BILIRUBINUR NEGATIVE 07/25/2016 1845   KETONESUR NEGATIVE 07/25/2016 1845   PROTEINUR 100 (A) 07/25/2016 1845   NITRITE NEGATIVE 07/25/2016 1845   LEUKOCYTESUR NEGATIVE 07/25/2016 1845   Sepsis Labs Invalid input(s): PROCALCITONIN,  WBC,  LACTICIDVEN Microbiology Recent Results (from the past 240 hour(s))  SARS CORONAVIRUS 2 (TAT 6-24 HRS) Nasopharyngeal Nasopharyngeal Swab     Status: None   Collection Time: 06/11/19  8:43 PM   Specimen: Nasopharyngeal Swab  Result Value Ref Range Status   SARS Coronavirus 2 NEGATIVE NEGATIVE Final    Comment: (NOTE) SARS-CoV-2 target nucleic acids are NOT DETECTED. The SARS-CoV-2 RNA is generally detectable in upper and lower respiratory specimens during the acute phase of infection. Negative results do not preclude SARS-CoV-2 infection, do not rule out co-infections with other pathogens, and should not be used as the sole basis for treatment or other patient management decisions. Negative results must be combined with clinical observations, patient history, and epidemiological information. The expected result is Negative. Fact Sheet for Patients: SugarRoll.be Fact Sheet for Healthcare Providers: https://www.woods-mathews.com/ This test is not yet approved or cleared by the [place] FDA and  has been authorized for detection and/or diagnosis of SARS-CoV-2 by FDA under an Emergency Use Authorization (EUA). This EUA will remain  in effect (meaning this test can be used) for the duration of the COVID-19 declaration under Section 56 4(b)(1) of the Act, 21 U.S.C. section 360bbb-3(b)(1), unless the authorization is terminated or revoked sooner. Performed at [facility], [place] [address] [place], [place] [address]      Time coordinating discharge: Over 30 minutes  SIGNED:    Emeterio Reeve DO [facility]

## 2019-06-15 NOTE — Plan of Care (Signed)
  Problem: Education: Goal: Knowledge of General Education information will improve Description: Including pain rating scale, medication(s)/side effects and non-pharmacologic comfort measures Outcome: Progressing   Problem: Health Behavior/Discharge Planning: Goal: Ability to manage health-related needs will improve Outcome: Progressing   Problem: Clinical Measurements: Goal: Ability to maintain clinical measurements within normal limits will improve Outcome: Progressing Goal: Will remain free from infection Outcome: Progressing Goal: Diagnostic test results will improve Outcome: Progressing Goal: Respiratory complications will improve Outcome: Progressing Goal: Cardiovascular complication will be avoided Outcome: Progressing   Problem: Activity: Goal: Risk for activity intolerance will decrease Outcome: Progressing Note: Pt able to walk to & from bathroom with minimal assistance & a steady gait.    Problem: Nutrition: Goal: Adequate nutrition will be maintained Outcome: Progressing   Problem: Pain Managment: Goal: General experience of comfort will improve Outcome: Progressing   Problem: Safety: Goal: Ability to remain free from injury will improve Outcome: Progressing   Problem: Skin Integrity: Goal: Risk for impaired skin integrity will decrease Outcome: Progressing

## 2019-06-15 NOTE — Consult Note (Addendum)
   [facility] CM Inpatient Consult   06/15/2019  Virgel Haro Aug 14, 1955 [id number]    THN: ACTIVE status   Patient is currently active with [facility]) [facility] Care Management for chronic disease management services. Patient has been engaged by [facility] Coordinator for COPD.  Patient has 18% risk for unplanned readmission and hospitalization. He is in the [facility] ACO plan.   Our community based plan of care has focused on disease management and community resource support.  Patient will receive a post hospital call and will be evaluated for assessments and disease process education.     Patient was admitted on 12/31/20for near syncope due to anemia related to GI bleeding. ,  Primary Care Provider is [name], MD with [facility].  Per transition of care CM note, patient is for discharge home today with home health PT (via [facility]).     Plan:  Notify [facility] RN care management coordinator of discharge disposition and needs.   For additional questions or referrals please contact:  Karin Golden A. Decarlo Rivet, BSN, RN-BC [facility] Liaison Cell: [phone]

## 2019-06-15 NOTE — Progress Notes (Addendum)
Pre-rounds chart review: LOS: 4  Overnight: no events per chart Pending: pill cam results, AM labs are in process as of 8:06 AM  Appreciate recs from GI re:  . Capsule study results affecting plan of care  Dispo: pending GI recs        Sunnie Nielsen DO [facility]  Pager [phone]  or secure message in Epic

## 2019-06-15 NOTE — Discharge Instructions (Signed)

## 2019-06-16 ENCOUNTER — Telehealth: Payer: Self-pay

## 2019-06-16 ENCOUNTER — Other Ambulatory Visit: Payer: Self-pay

## 2019-06-16 MED FILL — ATORVASTATIN 80 MG TABLET: 80 | 30 days supply | Qty: 30 | Fill #3

## 2019-06-16 MED FILL — PANTOPRAZOLE SOD DR 40 MG T: 40 | 30 days supply | Qty: 60 | Fill #0

## 2019-06-16 MED FILL — XARELTO 20 MG TABLET: 20 | 30 days supply | Qty: 30 | Fill #1

## 2019-06-16 MED FILL — TUDORZA PRESSAIR 400 MCG/AC: 400 | 30 days supply | Qty: 1 | Fill #2

## 2019-06-16 NOTE — Telephone Encounter (Signed)
Transition Care Management Follow-up Telephone Call  Date of discharge and from where: 06/15/2019, [facility]   How have you been since you were released from the hospital? He said that he is feeling all right. Denied any bleeding.   Any questions or concerns? He had no o questions/concerns at this time  Items Reviewed:  Did the pt receive and understand the discharge instructions provided? He said he has the instructions and has no questions   Medications obtained and verified? He said he has all medications and has no questions about his med regime and he did not want to review the medication instructions/list. He said that he is taking the meds as ordered.   Any new allergies since your discharge? None reported   Do you have support at home? Lives ina boarding house.  Sister provides support at needed  Other (ie: DME, Home Health, etc) referral was made to [facility] for home health at discharge.  He has not heard from them yet.   Has home O2, using at 3L continuously  Has nebulizer   Functional Questionnaire: (I = Independent and D = Dependent) ADL's: independent   Follow up appointments reviewed:    PCP Hospital f/u appt confirmed? Appointment scheduled for 06/23/2019 @ 1050 with Dr Alvis Lemmings .  Specialist Hospital f/u appt confirmed?has appointment with Dr [name] 06/30/2019.  Are transportation arrangements needed? He said that he will need to call medicaid 3 days in advance to schedule a ride. He is not sure if he ever followed through with [facility] and is not sure if [facility] will provide any transportation to his medical appts.   If their condition worsens, is the pt aware to call  their PCP or go to the ED? Yes  Was the patient provided with contact information for the PCP's office or ED? He has the phone number for the clinic  Was the pt encouraged to call back with questions or concerns? Yes

## 2019-06-16 NOTE — Patient Outreach (Signed)
[facility]) Care Management  06/16/2019  Kobee Medlen Nov 04, 1955 [id number]   Telephone call to patient for follow up after recent hospitalization from acute GI bleed.  Patient reports that he is doing well.  [facility] to see patient.  He states he has all his medications and has follow up to physician.  Discussed with patient signs of active GI bleed and importance of seeking help immediately due to the nature of his bleed.  He verbalized understanding.  Discussed with patient him being enrolled with the [facility].  He states he currently is and that the nurse calls him monthly. Advised that since he is active with his [employer] plan that CM would no longer outreach.  He verbalized understanding.   Plan: RN CM will close case at this time.    Bary Leriche, RN, MSN [facility] Care Management Care Management Coordinator Direct Line [phone] Cell [phone] Toll Free: [phone]  Fax: [phone]

## 2019-06-17 MED FILL — LOSARTAN POTASSIUM 50 MG TA: 50 | 30 days supply | Qty: 30 | Fill #1

## 2019-06-17 MED FILL — KETOROLAC 0.4% OPHTH SOLN: 0.4 | 18 days supply | Qty: 5 | Fill #0

## 2019-06-19 ENCOUNTER — Telehealth: Payer: Self-pay

## 2019-06-19 NOTE — Telephone Encounter (Signed)
Call placed to [facility] to check on status of home health referral made by the hospital.  Spoke to [facility] who said that she would check and get back to this CM

## 2019-06-20 DIAGNOSIS — J449 Chronic obstructive pulmonary disease, unspecified: Secondary | ICD-10-CM | POA: Diagnosis not present

## 2019-06-20 DIAGNOSIS — I4892 Unspecified atrial flutter: Secondary | ICD-10-CM | POA: Diagnosis not present

## 2019-06-20 DIAGNOSIS — L539 Erythematous condition, unspecified: Secondary | ICD-10-CM | POA: Diagnosis not present

## 2019-06-20 DIAGNOSIS — K31811 Angiodysplasia of stomach and duodenum with bleeding: Secondary | ICD-10-CM | POA: Diagnosis not present

## 2019-06-20 DIAGNOSIS — I4891 Unspecified atrial fibrillation: Secondary | ICD-10-CM | POA: Diagnosis not present

## 2019-06-20 DIAGNOSIS — I5042 Chronic combined systolic (congestive) and diastolic (congestive) heart failure: Secondary | ICD-10-CM | POA: Diagnosis not present

## 2019-06-20 DIAGNOSIS — I110 Hypertensive heart disease with heart failure: Secondary | ICD-10-CM | POA: Diagnosis not present

## 2019-06-20 DIAGNOSIS — I429 Cardiomyopathy, unspecified: Secondary | ICD-10-CM | POA: Diagnosis not present

## 2019-06-20 DIAGNOSIS — D649 Anemia, unspecified: Secondary | ICD-10-CM | POA: Diagnosis not present

## 2019-06-20 DIAGNOSIS — K922 Gastrointestinal hemorrhage, unspecified: Secondary | ICD-10-CM | POA: Diagnosis not present

## 2019-06-23 ENCOUNTER — Encounter: Payer: Self-pay | Admitting: Family Medicine

## 2019-06-23 ENCOUNTER — Ambulatory Visit: Payer: Medicare HMO | Attending: Family Medicine | Admitting: Family Medicine

## 2019-06-23 ENCOUNTER — Other Ambulatory Visit: Payer: Self-pay

## 2019-06-23 DIAGNOSIS — I4892 Unspecified atrial flutter: Secondary | ICD-10-CM

## 2019-06-23 DIAGNOSIS — D500 Iron deficiency anemia secondary to blood loss (chronic): Secondary | ICD-10-CM | POA: Diagnosis not present

## 2019-06-23 DIAGNOSIS — K5520 Angiodysplasia of colon without hemorrhage: Secondary | ICD-10-CM | POA: Diagnosis not present

## 2019-06-23 DIAGNOSIS — J432 Centrilobular emphysema: Secondary | ICD-10-CM | POA: Diagnosis not present

## 2019-06-23 DIAGNOSIS — G629 Polyneuropathy, unspecified: Secondary | ICD-10-CM

## 2019-06-23 DIAGNOSIS — J9611 Chronic respiratory failure with hypoxia: Secondary | ICD-10-CM | POA: Diagnosis not present

## 2019-06-23 MED ORDER — DULOXETINE HCL 60 MG PO CPEP: 60.0000 mg | ORAL_CAPSULE | Freq: Every day | ORAL | 3 refills | Status: DC

## 2019-06-23 MED FILL — DULoxetine HCL 60 MG CPEP: 60 | 30 days supply | Qty: 30 | Fill #0

## 2019-06-23 NOTE — Progress Notes (Signed)
Patient has been called and DOB has been verified. Patient has been screened and transferred to PCP to start phone visit.     

## 2019-06-23 NOTE — Progress Notes (Signed)
Virtual Visit via Telephone Note  I connected with Jared [name], on 06/23/2019 at 10:06 AM by telephone due to the COVID-19 pandemic and verified that I am speaking with the correct person using two identifiers.   Consent: I discussed the limitations, risks, security and privacy concerns of performing an evaluation and management service by telephone and the availability of in person appointments. I also discussed with the patient that there may be a patient responsible charge related to this service. The patient expressed understanding and agreed to proceed.   Location of Patient: Home  Location of Provider: Clinic   Persons participating in Telemedicine visit: Nora Veda Canning Farrington-CMA Dr. Margarita Rana     History of Present Illness: Jared [name] is a 64 year old male with a history of hypertension, COPD, tobacco abuse, NICM , CHF (EF 45-50% from echo 04/2018 improved from 30-35% in 09/2016), atrial flutter, chronic respiratory failure with hypoxia (currently on 3 L of oxygen) seenfor a transitional care visit after hospitalization at [facility] for syncope secondary to symptomatic anemia and GI bleed. On presentation he was found to have hemoglobin of 5.6 for which he received 3 units of PRBC, IV Feraheme; stool for occult blood was positive.  Also noted to have runs of V. tach by EMS which was absent at the ED. He was also treated with IV PPI, underwent capsule endoscopy which revealed gastric erythema/gastritis, no evidence of AVMs.  He was continued on Xarelto after joint decision-making with physicians during hospitalization. In 10/2018 he had a similar episode of GI bleed and capsule endoscopy revealed single AVM treated with APC. Discharge hemoglobin was 8.5.  He denies presence of hematochezia or melena but states his stools have always been dark green. He endorses presence of dizziness  When he stands still he gets dizzy and feels like "he is about to pass out"  but denies syncope. He lives in a boarding house and has people around him. Does not have a follow-up with GI.  He is on OTC iron pills Presthesia still occurs in his feet and he noticed slight improvement initially on Gabapentin but now Gabapentin has been ineffective. Sometimes when he makes a fist, his hands feel real tight and tingly in all his fingers of both hands.  His breathing has been better on [place]; he quit smoking one week ago and sees Pulmonology next week. Past Medical History:  Diagnosis Date  . CAD (coronary artery disease)    a. LHC 5/12:  LAD 20, pLCx 20, pRCA 40, dRCA 40, EF 35%, diff HK  //  b. Myoview 4/16: Overall Impression: High risk stress nuclear study There is no evidence of ischemia. There is severe LV dysfunction. LV Ejection Fraction: 30%. LV Wall Motion: There is global LV hypokinesis.   Marland Kitchen CAP (community acquired pneumonia) 09/2013  . Chronic combined systolic and diastolic CHF (congestive heart failure) (HCC)    a. Echo 4/16:Mild LVH, EF 40-45%, diffuse HK //  b. Echo 8/17: EF 35-40%, diffuse HK, diastolic dysfunction, aortic sclerosis, trivial MR, moderate LAE, normal RVSF, moderate RAE, mild TR, PASP 42 mmHg // c. Echo 4/18: Mild concentric LVH, EF 30-35, normal wall motion, grade 1 diastolic dysfunction, PASP 49  . Cluster headache    "hx; haven't had one in awhile" (01/09/2016)  . COPD (chronic obstructive pulmonary disease) ([place])    [name] 01/09/2016  . History of CVA (cerebrovascular accident)   . Hypertension   . NICM (nonischemic cardiomyopathy) ([place])   . Tobacco abuse  Allergies  Allergen Reactions  . Lisinopril Cough    Current Outpatient Medications on File Prior to Visit  Medication Sig Dispense Refill  . Aclidinium Bromide (TUDORZA PRESSAIR) 400 MCG/ACT AEPB Inhale 1 puff into the lungs 2 (two) times daily. 60 each 5  . albuterol (PROVENTIL) (2.5 MG/3ML) 0.083% nebulizer solution Take 3 mLs (2.5 mg total) by nebulization every 4  (four) hours as needed for wheezing or shortness of breath. 75 mL 12  . albuterol (VENTOLIN HFA) 108 (90 Base) MCG/ACT inhaler Inhale 2 puffs into the lungs every 6 (six) hours as needed for wheezing or shortness of breath. 1 Inhaler 5  . BESIVANCE 0.6 % SUSP Place 1 drop into the right eye 2 (two) times daily.    . carvedilol (COREG) 3.125 MG tablet Take 1 tablet (3.125 mg total) by mouth 2 (two) times daily. 60 tablet 6  . cetirizine (ZYRTEC) 10 MG tablet Take 1 tablet (10 mg total) by mouth daily. 30 tablet 1  . furosemide (LASIX) 40 MG tablet Take 1 tablet (40 mg total) by mouth daily. 30 tablet 6  . gabapentin (NEURONTIN) 300 MG capsule Take 2 capsules (600 mg total) by mouth at bedtime. 30 capsule 6  . losartan (COZAAR) 50 MG tablet Take 1 tablet (50 mg total) by mouth daily. 30 tablet 6  . LOTEMAX 0.5 % ophthalmic suspension Place 1 drop into the right eye 4 (four) times daily.    . Misc. Devices MISC Portable oxygen concentrator. Diagnosis COPD. 1 each 0  . pantoprazole (PROTONIX) 40 MG tablet Take 1 tablet (40 mg total) by mouth 2 (two) times daily before a meal. 60 tablet 0  . PROLENSA 0.07 % SOLN Place 1 drop into the right eye daily.    . rivaroxaban (XARELTO) 20 MG TABS tablet Take 1 tablet (20 mg total) by mouth daily with supper. 30 tablet 6  . fluticasone (FLONASE) 50 MCG/ACT nasal spray Place 1 spray into both nostrils daily for 30 days. 0.002 g 0   No current facility-administered medications on file prior to visit.    Observations/Objective: Awake, alert, oriented x3 Not in acute distress  Lab Results  Component Value Date   HGBA1C 5.6 05/18/2019    CBC    Component Value Date/Time   WBC 6.8 06/15/2019 0724   RBC 3.82 (L) 06/15/2019 0724   HGB 8.5 (L) 06/15/2019 0724   HGB 6.8 (LL) 09/24/2018 1414   HCT 31.9 (L) 06/15/2019 0724   HCT 23.5 (L) 09/24/2018 1414   PLT 110 (L) 06/15/2019 0724   PLT 381 09/24/2018 1414   MCV 83.5 06/15/2019 0724   MCV 86 09/24/2018  1414   MCH 22.3 (L) 06/15/2019 0724   MCHC 26.6 (L) 06/15/2019 0724   RDW 21.0 (H) 06/15/2019 0724   RDW 15.6 (H) 09/24/2018 1414   LYMPHSABS 1.0 06/11/2019 1825   LYMPHSABS 3.4 (H) 09/24/2018 1414   MONOABS 0.7 06/11/2019 1825   EOSABS 0.0 06/11/2019 1825   EOSABS 0.1 09/24/2018 1414   BASOSABS 0.0 06/11/2019 1825   BASOSABS 0.1 09/24/2018 1414    Assessment and Plan: 1. Iron deficiency anemia due to chronic blood loss Discharge hemoglobin was 8.5 He currently denies GI bleed but is symptomatic We will order follow-up CBC Refer to GI - Ambulatory referral to Gastroenterology - CBC with Differential/Platelet; Future - Iron, TIBC and Ferritin Panel; Future  2. Chronic respiratory failure with hypoxia (HCC) Secondary to COPD Stable on 3 L of oxygen  3. AV  malformation of gastrointestinal tract No recent AVM on recent capsule endoscopy He continues to have recurrent GI bleeds We will refer to GI - Ambulatory referral to Gastroenterology  4. Atrial flutter, unspecified type (HCC) Discussed risk versus benefit of Xarelto in the setting of ongoing GI bleed He would like to remain on Xarelto Rate controlled on Coreg  5. Neuropathy Uncontrolled We will send of B12 level Commenced on Cymbalta Continue gabapentin-holding off on increasing dose due to sedation - Vitamin B12; Future  6. Centrilobular emphysema (HCC) Stable Symptoms have improved on Tudorza He has quit smoking and has been commended Keep appointment with pulmonary   Follow Up Instructions: 3 months   I discussed the assessment and treatment plan with the patient. The patient was provided an opportunity to ask questions and all were answered. The patient agreed with the plan and demonstrated an understanding of the instructions.   The patient was advised to call back or seek an in-person evaluation if the symptoms worsen or if the condition fails to improve as anticipated.     I provided 30 minutes  total of non-face-to-face time during this encounter including median intraservice time, reviewing previous notes, investigations, ordering medications, medical decision making, coordinating care and patient verbalized understanding at the end of the visit.     [name], MD, FAAFP. [facility] [place], [place] [phone]   06/23/2019, 10:06 AM

## 2019-06-24 ENCOUNTER — Encounter: Payer: Self-pay | Admitting: Family Medicine

## 2019-06-24 ENCOUNTER — Other Ambulatory Visit: Payer: Self-pay

## 2019-06-24 ENCOUNTER — Ambulatory Visit: Payer: Medicare HMO | Attending: Family Medicine

## 2019-06-24 DIAGNOSIS — L539 Erythematous condition, unspecified: Secondary | ICD-10-CM | POA: Diagnosis not present

## 2019-06-24 DIAGNOSIS — I4891 Unspecified atrial fibrillation: Secondary | ICD-10-CM | POA: Diagnosis not present

## 2019-06-24 DIAGNOSIS — K31811 Angiodysplasia of stomach and duodenum with bleeding: Secondary | ICD-10-CM | POA: Diagnosis not present

## 2019-06-24 DIAGNOSIS — I429 Cardiomyopathy, unspecified: Secondary | ICD-10-CM | POA: Diagnosis not present

## 2019-06-24 DIAGNOSIS — I5042 Chronic combined systolic (congestive) and diastolic (congestive) heart failure: Secondary | ICD-10-CM | POA: Diagnosis not present

## 2019-06-24 DIAGNOSIS — G629 Polyneuropathy, unspecified: Secondary | ICD-10-CM | POA: Diagnosis not present

## 2019-06-24 DIAGNOSIS — I4892 Unspecified atrial flutter: Secondary | ICD-10-CM | POA: Diagnosis not present

## 2019-06-24 DIAGNOSIS — I110 Hypertensive heart disease with heart failure: Secondary | ICD-10-CM | POA: Diagnosis not present

## 2019-06-24 DIAGNOSIS — D649 Anemia, unspecified: Secondary | ICD-10-CM | POA: Diagnosis not present

## 2019-06-24 DIAGNOSIS — D500 Iron deficiency anemia secondary to blood loss (chronic): Secondary | ICD-10-CM | POA: Diagnosis not present

## 2019-06-24 DIAGNOSIS — J449 Chronic obstructive pulmonary disease, unspecified: Secondary | ICD-10-CM | POA: Diagnosis not present

## 2019-06-25 ENCOUNTER — Telehealth: Payer: Self-pay

## 2019-06-25 LAB — CBC WITH DIFFERENTIAL/PLATELET
Basophils Absolute: 0.1 10*3/uL (ref 0.0–0.2)
Basos: 1 %
EOS (ABSOLUTE): 0.2 10*3/uL (ref 0.0–0.4)
Eos: 2 %
Hematocrit: 40.6 % (ref 37.5–51.0)
Hemoglobin: 11.5 g/dL — ABNORMAL LOW (ref 13.0–17.7)
Immature Grans (Abs): 0 10*3/uL (ref 0.0–0.1)
Immature Granulocytes: 0 %
Lymphocytes Absolute: 2.5 10*3/uL (ref 0.7–3.1)
Lymphs: 26 %
MCH: 24 pg — ABNORMAL LOW (ref 26.6–33.0)
MCHC: 28.3 g/dL — ABNORMAL LOW (ref 31.5–35.7)
MCV: 85 fL (ref 79–97)
Monocytes Absolute: 0.8 10*3/uL (ref 0.1–0.9)
Monocytes: 8 %
Neutrophils Absolute: 6.3 10*3/uL (ref 1.4–7.0)
Neutrophils: 63 %
Platelets: 459 10*3/uL — ABNORMAL HIGH (ref 150–450)
RBC: 4.8 x10E6/uL (ref 4.14–5.80)
RDW: 22.2 % — ABNORMAL HIGH (ref 11.6–15.4)
WBC: 9.9 10*3/uL (ref 3.4–10.8)

## 2019-06-25 LAB — IRON,TIBC AND FERRITIN PANEL
Ferritin: 122 ng/mL (ref 30–400)
Iron Saturation: 25 % (ref 15–55)
Iron: 84 ug/dL (ref 38–169)
Total Iron Binding Capacity: 341 ug/dL (ref 250–450)
UIBC: 257 ug/dL (ref 111–343)

## 2019-06-25 LAB — VITAMIN B12: Vitamin B-12: 1204 pg/mL (ref 232–1245)

## 2019-06-25 NOTE — Telephone Encounter (Signed)
-----   Message from [name], MD sent at 06/25/2019 10:06 AM EST ----- Blood work reveal anemia has significantly improved and would unlikely be the cause of his dizziness. Please advise him to check his BP and inform me as low BP can also cause dizziness. B12 level is normal and does not explain his numbness. Continue medication regimen recently initiated.Thanks.

## 2019-06-25 NOTE — Telephone Encounter (Signed)
Call placed to [facility], spoke to [facility] who said that the patient was admitted for services 06/20/2019

## 2019-06-25 NOTE — Telephone Encounter (Signed)
Patient name and DOB has been verified Patient was informed of lab results. Patient had no questions.  

## 2019-06-26 DIAGNOSIS — I4891 Unspecified atrial fibrillation: Secondary | ICD-10-CM | POA: Diagnosis not present

## 2019-06-26 DIAGNOSIS — I110 Hypertensive heart disease with heart failure: Secondary | ICD-10-CM | POA: Diagnosis not present

## 2019-06-26 DIAGNOSIS — D649 Anemia, unspecified: Secondary | ICD-10-CM | POA: Diagnosis not present

## 2019-06-26 DIAGNOSIS — I5042 Chronic combined systolic (congestive) and diastolic (congestive) heart failure: Secondary | ICD-10-CM | POA: Diagnosis not present

## 2019-06-26 DIAGNOSIS — I429 Cardiomyopathy, unspecified: Secondary | ICD-10-CM | POA: Diagnosis not present

## 2019-06-26 DIAGNOSIS — L539 Erythematous condition, unspecified: Secondary | ICD-10-CM | POA: Diagnosis not present

## 2019-06-26 DIAGNOSIS — K31811 Angiodysplasia of stomach and duodenum with bleeding: Secondary | ICD-10-CM | POA: Diagnosis not present

## 2019-06-26 DIAGNOSIS — J449 Chronic obstructive pulmonary disease, unspecified: Secondary | ICD-10-CM | POA: Diagnosis not present

## 2019-06-26 DIAGNOSIS — I4892 Unspecified atrial flutter: Secondary | ICD-10-CM | POA: Diagnosis not present

## 2019-06-29 DIAGNOSIS — I5042 Chronic combined systolic (congestive) and diastolic (congestive) heart failure: Secondary | ICD-10-CM | POA: Diagnosis not present

## 2019-06-29 DIAGNOSIS — I4892 Unspecified atrial flutter: Secondary | ICD-10-CM | POA: Diagnosis not present

## 2019-06-29 DIAGNOSIS — I429 Cardiomyopathy, unspecified: Secondary | ICD-10-CM | POA: Diagnosis not present

## 2019-06-29 DIAGNOSIS — K31811 Angiodysplasia of stomach and duodenum with bleeding: Secondary | ICD-10-CM | POA: Diagnosis not present

## 2019-06-29 DIAGNOSIS — I110 Hypertensive heart disease with heart failure: Secondary | ICD-10-CM | POA: Diagnosis not present

## 2019-06-29 DIAGNOSIS — J449 Chronic obstructive pulmonary disease, unspecified: Secondary | ICD-10-CM | POA: Diagnosis not present

## 2019-06-29 DIAGNOSIS — I4891 Unspecified atrial fibrillation: Secondary | ICD-10-CM | POA: Diagnosis not present

## 2019-06-29 DIAGNOSIS — D649 Anemia, unspecified: Secondary | ICD-10-CM | POA: Diagnosis not present

## 2019-06-29 DIAGNOSIS — H2512 Age-related nuclear cataract, left eye: Secondary | ICD-10-CM | POA: Diagnosis not present

## 2019-06-29 DIAGNOSIS — L539 Erythematous condition, unspecified: Secondary | ICD-10-CM | POA: Diagnosis not present

## 2019-06-30 ENCOUNTER — Other Ambulatory Visit: Payer: Self-pay

## 2019-06-30 ENCOUNTER — Ambulatory Visit (INDEPENDENT_AMBULATORY_CARE_PROVIDER_SITE_OTHER): Payer: Medicare HMO | Admitting: Internal Medicine

## 2019-06-30 ENCOUNTER — Encounter: Payer: Self-pay | Admitting: Internal Medicine

## 2019-06-30 DIAGNOSIS — J9611 Chronic respiratory failure with hypoxia: Secondary | ICD-10-CM | POA: Diagnosis not present

## 2019-06-30 DIAGNOSIS — J449 Chronic obstructive pulmonary disease, unspecified: Secondary | ICD-10-CM

## 2019-06-30 DIAGNOSIS — F17210 Nicotine dependence, cigarettes, uncomplicated: Secondary | ICD-10-CM

## 2019-06-30 DIAGNOSIS — J9612 Chronic respiratory failure with hypercapnia: Secondary | ICD-10-CM | POA: Diagnosis not present

## 2019-06-30 MED ORDER — ANORO ELLIPTA 62.5-25 MCG/INH IN AEPB: 2.0000 | INHALATION_SPRAY | Freq: Once | RESPIRATORY_TRACT | 11 refills | Status: AC

## 2019-06-30 MED ORDER — ANORO ELLIPTA 62.5-25 MCG/INH IN AEPB: 1.0000 | INHALATION_SPRAY | Freq: Every day | RESPIRATORY_TRACT | 0 refills | Status: DC

## 2019-06-30 NOTE — Patient Instructions (Signed)
Plan A = Automatic = Always=    Anoro one click first thing in am, take two good drags   Plan B = Backup (to supplement plan A, not to replace it) Only use your albuterol inhaler as a rescue medication to be used if you can't catch your breath by resting or doing a relaxed purse lip breathing pattern.  - The less you use it, the better it will work when you need it. - Ok to use the inhaler up to 2 puffs  every 4 hours if you must but call for appointment if use goes up over your usual need - Don't leave home without it !!  (think of it like the spare tire for your car)   Plan C = Crisis (instead of Plan B but only if Plan B stops working) - only use your albuterol nebulizer if you first try Plan B and it fails to help > ok to use the nebulizer up to every 4 hours but if start needing it regularly call for immediate appointment   The key is to stop smoking completely before smoking completely stops you!       Please schedule a follow up visit in 6  months but call sooner if needed

## 2019-06-30 NOTE — Progress Notes (Signed)
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 He also reports dyspnea on exertion. In addition, he reports epigastric abdominal pain, which may radiate to the lower portion of his sternum versus chest pain . He denies any lower extremity edema. Admits to salt rich food ingestion .The symptoms are similar to those of one week ago, at which time, workup was suspicious for acute exacerbation of systolic heart failure. She was started on Cozaar, metoprolol and Lasix 20 mg daily at the time, and was recommended to see cardiology, which he failed to do so. His cardiologist is Dr. Dietrich Pates, last seen in December 2016.the patient continues to smoke 1 pack a day of cigarettes  Discharge Diagnoses:  Acute respiratory failure secondary to CHF exacerbation -O2 sats upon admission 89% -CXR unremarkable for infection -Responded well to IVlasix-->po lasix -Weaned off of O2-SpO2 95% on room air  Acute combined systolic/diastolic heart failure -BNP 2056.8 -Echocardiogram 09/20/2014 showed EF 40-45% -Transition to by mouth Lasix 20 mg daily -Echocardiogram 01/10/2016: EF 35-40%, diastolic dysfunction --AST 147, ALT 110 upon admission -Currently trending downward -hepatitis panel unremarkable -Patient denies drug/alcohol  use -switch metoprolol-->coreg -continue losartan  Essential Hypertension -Has not been taking home medications for over a year due to lack of insurance and monetary funds -Restarted on losartan, Lasix --switch metoprolol-->coreg 6.25 mg bid  CAD -Currently no chest pain -Continue coreg, losartan, ASA 81 mg daily -LDL 80  Tobacco Abuse -Smoking cessation discussed -Continue nicotine patch  Moderate malnutrition -Nutrition consult   History of Present Illness  02/29/2016 1st [facility] office visit/ Teneil Shiller  symb 160 2bid/ bid saba (not prn)  Chief Complaint  Patient presents with  . Pulmonary Consult    Pt referred by Tereso Newcomer, PA, for cough, hemoptysis, and emphysema. Pt c/o hemoptysis x 3 weeks, pt states this occurs daily but has been improving. Pt c/o increase in SOB with activity and rest.   acute  onset epistaxis/ hemoptyis end of Auguest > ER eval 02/20/16 dx RLL pna by CTa chest > rx levaquin/ prednisone and minimal hemoptysis now  Doe = MMRC2 = can't walk a nl pace on a flat grade s sob but does fine slow and flat eg [employer] shopping rec Plan A = Automatic =  Symbicort 160 Take 2 puffs first thing in am and then another 2 puffs about 12 hours later.  Work on inhaler technique: Plan B = Backup Only use your albuterol as a rescue medication\ Plan C = Crisis - only use your albuterol nebulizer if you first try Plan B and it fails to help > ok to use the nebulizer up to every 4 hours but if start needing it regularly call for immediate appointment  The key is to stop smoking completely before smoking completely stops you!  Please schedule a follow up office visit in  2 weeks, sooner if needed with cxr on return > did not return   Admit date: 05/04/2016 Discharge date: 05/09/2016    DISCHARGE DIAGNOSES:  Principal Problem:   Hemoptysis Active Problems:   Community acquired pneumonia of right lower lobe of lung (HCC) - - FOB RLL tbbx ATYPICAL PNEUMOCYTES  PROLIFERATION/ LLL BAL 05/08/16    Hypertension   COPD with acute exacerbation (HCC)   Nonischemic dilated cardiomyopathy (HCC)   Cigarette smoker   Chronic systolic CHF (congestive heart failure) (HCC)   06/03/16 to ER> only better while on neb    06/26/2016  f/u ov/Wilberto Console re:  GOLD III copd / maint symb 160 / saba  Chief Complaint  Patient presents with  . Follow-up    pt had CT today. pt states breathing has worsen since last OV. pt c/o increased sob with exertion, prod cough with white mucus, wheezing & chest tightness,  breathing was worse w/in 2 days of last ov / then ran out of lasix one week ago and swelling started to get worse Also worse orthopnea ? Related to leg swelling in terms of timing but pt not sure  Doe now = MMRC3 = can't walk 100 yards even at a slow pace at a flat grade s stopping due to sob  rec Bisoprolol 5 mg one daily in place of the twice daily corevidol and resume your lasix and potassium  Plan A = Automatic = stop symbicort and start bevespi Take 2 puffs first thing in am and then another 2 puffs about 12 hours later.  Plan B = Backup Only use your albuterol as a rescue medication The key is to stop smoking completely before smoking completely stops you!  Please schedule a follow up office visit in 4 weeks, sooner if needed bring all meds      12/31/2018  f/u ov/Finola Rosal re: re establish re copd now BREO ? Some bladder problems on LAMA?  Chief Complaint  Patient presents with  . Follow-up    Breathing seems slightly worse since the last visit. He is using his ventolin inhaler once per day and nebs about 2 x per day.   Dyspnea:  Worse for at least a year no better with 02/ some discomfort off 02 " like tightness "  Currently MMRC3 = can't walk 100 yards even at a slow pace at a flat grade s stopping due to sob even on 02 up to 4lpm  Cough: none  Sleeping: flat ok / no am cough /  SABA use: as above  Better p nebs/ over using  02: 3lpm 24/7  rec We need  you need you to monitor your 02 level to be sure it's above 90% Be sure protonix 40 mg Take 30-60 min before first meal of the day  We will ask [facility] to do BEST FIT analysis for longterm 02 The key is to stop smoking completely before smoking completely stops you! Add tudorza one puff twice daily  Only use your albuterol as a rescue medication  Only use nebulizer if you try the inhaler first  = change duoneb to pure albuterol 2.5 mg up to  every 4 hours is the max  Prednisone 10 mg take  4 each am x 2 days,   2 each am x 2 days,  1 each am x 2 days and stop  Please schedule a follow up office visit in 4 weeks, sooner if needed  with all medications /inhalers/ solutions in hand so we  can verify exactly what you are taking. This includes all medications from all doctors and over the counters Add: f/u sinus dz by CT    01/28/2019  f/u ov/Damarea Merkel re:  GOLD III/ still smoking  Chief Complaint  Patient presents with  . Follow-up    Patient reports that his breathing is the same since last visit. He reports using his rescue inhaler/nebulizer daily.  Dyspnea:  MMRC3 = can't walk 100 yards even at a slow pace at a flat grade s stopping due to sob  Even on 3lpm but does not check sats Cough: some in am min mucoid  Sleeping: able to lie flat one pillow  SABA use: both albuterol neb  02: 2lpm sleep / increases to 3lpm with activity inside house/ 2lpm at rest and portable at 2lpm    06/30/2019  f/u ov/Ibraham Levi re: GOLD III/ 02 dep but till smoking maint on tudorza one bid Chief Complaint  Patient presents with  . Follow-up    Breathing has improved some and he is using his albuterol inhaler 2 x per wk and neb about 3 x per wk.   Dyspnea: doe x room to room  Cough: none  Sleeping: lies flat / one pillow  SABA use: as above  02: 2lpm up to 3lpm    No obvious day to day or daytime variability or assoc excess/ purulent sputum or mucus plugs or hemoptysis or cp or chest tightness, subjective wheeze or  overt sinus or hb symptoms.   Sleeping ok  without nocturnal  or early am exacerbation  of respiratory  c/o's or need for noct saba. Also denies any obvious fluctuation of symptoms with weather or environmental changes or other aggravating or alleviating factors except as outlined above   No unusual exposure hx or h/o childhood pna/ asthma or knowledge of premature birth.  Current Allergies, Complete Past Medical History, Past Surgical History, Family History, and Social History were reviewed in [employer] record.  ROS  The following are not active complaints unless bolded Hoarseness, sore throat, dysphagia, dental problems, itching, sneezing,  nasal congestion or discharge of excess mucus or purulent secretions, ear ache,   fever, chills, sweats, unintended wt loss or wt gain, classically pleuritic or exertional cp,  orthopnea pnd or arm/hand swelling  or leg swelling, presyncope, palpitations, abdominal pain, anorexia, nausea, vomiting, diarrhea  or change in bowel habits or change in bladder habits, change in stools or change in urine, dysuria, hematuria,  rash, arthralgias, visual complaints, headache, numbness, weakness or ataxia or problems with walking or coordination,  change in mood or  memory.        Current Meds  Medication Sig  . Aclidinium Bromide (TUDORZA PRESSAIR) 400 MCG/ACT AEPB Inhale 1 puff into the lungs 2 (two) times daily.  Marland Kitchen albuterol (PROVENTIL) (2.5 MG/3ML) 0.083% nebulizer solution Take 3 mLs (2.5 mg total) by nebulization every 4 (four) hours as needed for wheezing or shortness of breath.  Marland Kitchen albuterol (VENTOLIN HFA) 108 (90 Base) MCG/ACT inhaler Inhale 2 puffs into the lungs every 6 (six) hours as needed for wheezing or shortness of breath.  . BESIVANCE 0.6 % SUSP Place 1 drop into the right eye 2 (two) times daily.  . carvedilol (COREG) 3.125 MG tablet Take 1 tablet (3.125 mg total) by mouth 2 (two) times daily.  . cetirizine (ZYRTEC) 10 MG  tablet Take 1 tablet (10 mg total) by mouth daily.  . DULoxetine (CYMBALTA) 60 MG capsule Take 1 capsule (  60 mg total) by mouth daily. For paresthesia  . furosemide (LASIX) 40 MG tablet Take 1 tablet (40 mg total) by mouth daily.  Marland Kitchen gabapentin (NEURONTIN) 300 MG capsule Take 2 capsules (600 mg total) by mouth at bedtime.  Marland Kitchen losartan (COZAAR) 50 MG tablet Take 1 tablet (50 mg total) by mouth daily.  Marland Kitchen LOTEMAX 0.5 % ophthalmic suspension Place 1 drop into the right eye 4 (four) times daily.  . Misc. Devices MISC Portable oxygen concentrator. Diagnosis COPD.  . pantoprazole (PROTONIX) 40 MG tablet Take 1 tablet (40 mg total) by mouth 2 (two) times daily before a meal.  . PROLENSA 0.07 % SOLN Place 1 drop into the right eye daily.  . rivaroxaban (XARELTO) 20 MG TABS tablet Take 1 tablet (20 mg total) by mouth daily with supper.                    Objective:   Physical Exam  amb bm nad  06/30/2019      189  01/28/2019      191 12/31/2018      189  10/22/2016      190  07/24/2016        209  06/26/2016        213  05/28/2016      194   02/29/16 177 lb 12.8 oz (80.6 kg)  02/24/16 182 lb 12.8 oz (82.9 kg)  02/20/16 175 lb (79.4 kg)     Vital signs reviewed  06/30/2019  - Note at rest 02 sats  99% on 3lpm        HEENT : pt wearing mask not removed for exam due to covid -19 concerns.    NECK :  without JVD/Nodes/TM/ nl carotid upstrokes bilaterally   LUNGS: no acc muscle use,  Mod barrel  contour chest wall with bilateral  Distant bs s audible wheeze and  without cough on insp or exp maneuvers and mod  Hyperresonant  to  percussion bilaterally     CV:  RRR  no s3 or murmur or increase in P2, and no edema   ABD:  soft and nontender with pos mid insp Hoover's  in the supine position. No bruits or organomegaly appreciated, bowel sounds nl  MS:     ext warm without deformities, calf tenderness, cyanosis or clubbing No obvious joint restrictions   SKIN: warm and dry without lesions     NEURO:  alert, approp, nl sensorium with  no motor or cerebellar deficits apparent.               I personally reviewed images and agree with radiology impression as follows:  CXR:   06/11/19 Aortic atherosclerosis. No acute cardiopulmonary abnormality seen.  Labs  reviewed:      Chemistry      Component Value Date/Time   NA 143 06/15/2019 0724   NA 145 (H) 05/18/2019 1052   K 3.9 06/15/2019 0724   CL 97 (L) 06/15/2019 0724   CO2 38 (H) 06/15/2019 0724   BUN 5 (L) 06/15/2019 0724   BUN 11 05/18/2019 1052   CREATININE 0.74 06/15/2019 0724   CREATININE 1.20 07/30/2016 1431      Component Value Date/Time   CALCIUM 8.8 (L) 06/15/2019 0724   ALKPHOS 60 06/15/2019 0724   AST 18 06/15/2019 0724   ALT 15 06/15/2019 0724   BILITOT 0.7 06/15/2019 0724        Lab Results  Component Value Date   WBC  9.9 06/24/2019   HGB 11.5 (L) 06/24/2019   HCT 40.6 06/24/2019   MCV 85 06/24/2019   PLT 459 (H) 06/24/2019                Assessment & Plan:

## 2019-06-30 NOTE — Assessment & Plan Note (Signed)
4-5 min discussion re active cigarette smoking in addition to office E&M  Ask about tobacco use:   ongoing Advise quitting   I emphasized that although we never turn away smokers from the pulmonary clinic, we do ask that they understand that the recommendations that we make  won't work nearly as well in the presence of continued cigarette exposure. In fact, we may very well  reach a point where we can't promise to help the patient if he/she can't quit smoking. (We can and will promise to try to help, we just can't promise what we recommend will really work)  Assess willingness:  Not committed at this point Assist in quit attempt:  Per PCP when ready Arrange follow up:   Follow up per Primary Care planned       

## 2019-06-30 NOTE — Assessment & Plan Note (Addendum)
See copd - 10/22/2016  Walked RA x 3 laps @ 185 ft each stopped due to  End of study moderate pace, no desat  - HCO03  11/11/18  = 41  - 12/31/2018   Walked 2lpm   2 laps @  approx 216ft each @ avg pace  stopped due to  Sob on 2lpm but no desats   - 01/28/2019   Walked RA x one lap =  approx 250 ft - stopped due to  Sob with sats ok on 3lpm POC > referred for POC  - HCO3  06/15/19 = 38   Advised goal for sats is low 90's and warned re dangers of smoking and using 02 at same time.          Each maintenance medication was reviewed in detail including emphasizing most importantly the difference between maintenance and prns and under what circumstances the prns are to be triggered using an action plan format that is not reflected in the computer generated alphabetically organized AVS which I have not found useful in most complex patients, especially with respiratory illnesses  Total time for H and P, chart review, counseling, teaching device and generating AVS / charting = 20 min

## 2019-06-30 NOTE — Assessment & Plan Note (Signed)
Active smoker 02/29/2016  After extensive coaching HFA effectiveness =    75% > continue symbicort  - Spirometry 05/28/2016  FEV1 1.48 (40%)  Ratio 46  p am symbicort 160 2bid  - 06/26/2016  After extensive coaching HFA effectiveness =    90% > bevespi 2 bid and change coreg to bisoprolol 5 mg daily > not done as of 07/24/2016 > at ov 10/22/2016 off coreg and all inhalers and much better sob but still some cough on entresto maint - Spirometry 10/22/2016  FEV1 1.24 (35%)  Ratio 48 - 12/31/2018    tudorza rx one puff bid  - 01/28/2019  After extensive coaching inhaler device,  effectiveness =    90% with tudorza  03/09/2019 Reports improvement with [name] but not covered, refer to [facility]. May need PA processed- he has been on bevespi and spiriva in the past  - 06/30/2019  After extensive coaching inhaler device,  effectiveness =    90% with elipta try anoro one each am  Pt is Group B in terms of symptom/risk and laba/lama therefore appropriate rx at this point >>>  anoro better choice than tudorza if can access it/ advised   Pt informed of the seriousness of COVID 19 infection as a direct risk to lung health  and safey and to close contacts and should continue to wear a facemask in public and minimize exposure to public locations but especially avoid any area or activity where non-close contacts are not observing distancing or wearing an appropriate face mask.  I strongly recommended vaccine when offered.

## 2019-07-01 DIAGNOSIS — I5042 Chronic combined systolic (congestive) and diastolic (congestive) heart failure: Secondary | ICD-10-CM | POA: Diagnosis not present

## 2019-07-01 DIAGNOSIS — J449 Chronic obstructive pulmonary disease, unspecified: Secondary | ICD-10-CM | POA: Diagnosis not present

## 2019-07-01 DIAGNOSIS — L539 Erythematous condition, unspecified: Secondary | ICD-10-CM | POA: Diagnosis not present

## 2019-07-01 DIAGNOSIS — I429 Cardiomyopathy, unspecified: Secondary | ICD-10-CM | POA: Diagnosis not present

## 2019-07-01 DIAGNOSIS — D649 Anemia, unspecified: Secondary | ICD-10-CM | POA: Diagnosis not present

## 2019-07-01 DIAGNOSIS — K31811 Angiodysplasia of stomach and duodenum with bleeding: Secondary | ICD-10-CM | POA: Diagnosis not present

## 2019-07-01 DIAGNOSIS — I4891 Unspecified atrial fibrillation: Secondary | ICD-10-CM | POA: Diagnosis not present

## 2019-07-01 DIAGNOSIS — I4892 Unspecified atrial flutter: Secondary | ICD-10-CM | POA: Diagnosis not present

## 2019-07-01 DIAGNOSIS — I110 Hypertensive heart disease with heart failure: Secondary | ICD-10-CM | POA: Diagnosis not present

## 2019-07-01 MED FILL — FUROSEMIDE 40 MG TAB: 40 | 30 days supply | Qty: 30 | Fill #1

## 2019-07-02 DIAGNOSIS — H2512 Age-related nuclear cataract, left eye: Secondary | ICD-10-CM | POA: Diagnosis not present

## 2019-07-08 ENCOUNTER — Telehealth: Payer: Self-pay

## 2019-07-08 ENCOUNTER — Other Ambulatory Visit: Payer: Self-pay

## 2019-07-08 ENCOUNTER — Ambulatory Visit (INDEPENDENT_AMBULATORY_CARE_PROVIDER_SITE_OTHER): Payer: Medicare HMO | Admitting: Gastroenterology

## 2019-07-08 ENCOUNTER — Encounter: Payer: Self-pay | Admitting: Gastroenterology

## 2019-07-08 VITALS — BP 96/62 | HR 97 | Temp 97.0°F | Ht 75.0 in | Wt 196.5 lb

## 2019-07-08 DIAGNOSIS — I4892 Unspecified atrial flutter: Secondary | ICD-10-CM | POA: Diagnosis not present

## 2019-07-08 DIAGNOSIS — D500 Iron deficiency anemia secondary to blood loss (chronic): Secondary | ICD-10-CM

## 2019-07-08 DIAGNOSIS — I4891 Unspecified atrial fibrillation: Secondary | ICD-10-CM | POA: Diagnosis not present

## 2019-07-08 DIAGNOSIS — K5521 Angiodysplasia of colon with hemorrhage: Secondary | ICD-10-CM

## 2019-07-08 DIAGNOSIS — L539 Erythematous condition, unspecified: Secondary | ICD-10-CM | POA: Diagnosis not present

## 2019-07-08 DIAGNOSIS — I5042 Chronic combined systolic (congestive) and diastolic (congestive) heart failure: Secondary | ICD-10-CM | POA: Diagnosis not present

## 2019-07-08 DIAGNOSIS — J449 Chronic obstructive pulmonary disease, unspecified: Secondary | ICD-10-CM | POA: Diagnosis not present

## 2019-07-08 DIAGNOSIS — D649 Anemia, unspecified: Secondary | ICD-10-CM | POA: Diagnosis not present

## 2019-07-08 DIAGNOSIS — I110 Hypertensive heart disease with heart failure: Secondary | ICD-10-CM | POA: Diagnosis not present

## 2019-07-08 DIAGNOSIS — K31811 Angiodysplasia of stomach and duodenum with bleeding: Secondary | ICD-10-CM | POA: Diagnosis not present

## 2019-07-08 DIAGNOSIS — I429 Cardiomyopathy, unspecified: Secondary | ICD-10-CM | POA: Diagnosis not present

## 2019-07-08 NOTE — Patient Instructions (Signed)
If you are age 67 or older, your body mass index should be between 23-30. Your Body mass index is 24.56 kg/m. If this is out of the aforementioned range listed, please consider follow up with your Primary Care Provider.  If you are age 24 or younger, your body mass index should be between 19-25. Your Body mass index is 24.56 kg/m. If this is out of the aformentioned range listed, please consider follow up with your Primary Care Provider.   We will send a referral to hematology. They will call you to schedule an appointment. If you don't hear from them, please call me at [phone] and ask for Sheralyn Boatman, CMA  It was a pleasure to see you today!  Dr. Myrtie Neither

## 2019-07-08 NOTE — Progress Notes (Signed)
Jared [name] Progress Note  Chief Complaint: Anemia of chronic [name] blood loss  Subjective  History: Follow-up after recent hospital stay for recurrent blood loss anemia of obscure small bowel source.  Repeat video capsule study done during the recent admission, no source of bleeding found.  Recommendations were for outpatient IV iron and consideration of discontinuing his oral anticoagulation.  Tran still has chronic dyspnea and requires supplemental oxygen.  However, he is less short of breath than when he was hospitalized with severe anemia.  Unfortunately, he continues to smoke several cigarettes per day.  He denies abdominal pain, nausea or vomiting.  He is taking his iron tablets, and says several days ago he had a single small episode of some black stool.  ROS: Cardiovascular:  no chest pain Respiratory: Chronic dyspnea Mood stable Remainder of systems negative except as above  The patient's Past Medical, Family and Social History were reviewed and are on file in the EMR. No prior hematology consultation or outpatient IV iron treatments can be found in chart review Objective:  Med list reviewed  Current Outpatient Medications:  .  albuterol (PROVENTIL) (2.5 MG/3ML) 0.083% nebulizer solution, Take 3 mLs (2.5 mg total) by nebulization every 4 (four) hours as needed for wheezing or shortness of breath., Disp: 75 mL, Rfl: 12 .  albuterol (VENTOLIN HFA) 108 (90 Base) MCG/ACT inhaler, Inhale 2 puffs into the lungs every 6 (six) hours as needed for wheezing or shortness of breath., Disp: 1 Inhaler, Rfl: 5 .  BESIVANCE 0.6 % SUSP, Place 1 drop into the right eye 2 (two) times daily., Disp: , Rfl:  .  carvedilol (COREG) 3.125 MG tablet, Take 1 tablet (3.125 mg total) by mouth 2 (two) times daily., Disp: 60 tablet, Rfl: 6 .  cetirizine (ZYRTEC) 10 MG tablet, Take 1 tablet (10 mg total) by mouth daily., Disp: 30 tablet, Rfl: 1 .  DULoxetine (CYMBALTA) 60 MG capsule, Take 1 capsule  (60 mg total) by mouth daily. For paresthesia, Disp: 30 capsule, Rfl: 3 .  furosemide (LASIX) 40 MG tablet, Take 1 tablet (40 mg total) by mouth daily., Disp: 30 tablet, Rfl: 6 .  gabapentin (NEURONTIN) 300 MG capsule, Take 2 capsules (600 mg total) by mouth at bedtime., Disp: 30 capsule, Rfl: 6 .  losartan (COZAAR) 50 MG tablet, Take 1 tablet (50 mg total) by mouth daily., Disp: 30 tablet, Rfl: 6 .  LOTEMAX 0.5 % ophthalmic suspension, Place 1 drop into the right eye 4 (four) times daily., Disp: , Rfl:  .  Misc. Devices MISC, Portable oxygen concentrator. Diagnosis COPD., Disp: 1 each, Rfl: 0 .  OXYGEN, Inhale into the lungs. 2-3 Liters continuosly, Disp: , Rfl:  .  pantoprazole (PROTONIX) 40 MG tablet, Take 1 tablet (40 mg total) by mouth 2 (two) times daily before a meal., Disp: 60 tablet, Rfl: 0 .  PROLENSA 0.07 % SOLN, Place 1 drop into the right eye daily., Disp: , Rfl:  .  rivaroxaban (XARELTO) 20 MG TABS tablet, Take 1 tablet (20 mg total) by mouth daily with supper., Disp: 30 tablet, Rfl: 6 .  fluticasone (FLONASE) 50 MCG/ACT nasal spray, Place 1 spray into both nostrils daily for 30 days., Disp: 0.002 g, Rfl: 0   Vital signs in last 24 hrs: Vitals:   07/08/19 0938  BP: 96/62  Pulse: 97  Temp: (!) 97 F (36.1 C)  SpO2: 96%    Physical Exam  Chronically ill-appearing man, breathing comfortably at rest on supplemental  oxygen.  Able to get on exam table without assistance.  HEENT: sclera anicteric, oral mucosa moist without lesions  Neck: supple, no thyromegaly, JVD or lymphadenopathy  Cardiac: RRR without murmurs, S1S2 heard, no peripheral edema  Pulm: Bilateral and expiratory wheezing bilaterally, mildly increased respiratory rate, no respiratory distress or cough  Abdomen: soft, no tenderness, with active bowel sounds. No guarding or palpable hepatosplenomegaly.  Skin; warm and dry, no jaundice or rash  Recent Labs:  CBC Latest Ref Rng & Units 06/24/2019 06/15/2019  06/14/2019  WBC 3.4 - 10.8 x10E3/uL 9.9 6.8 7.4  Hemoglobin 13.0 - 17.7 g/dL 11.5(L) 8.5(L) 8.3(L)  Hematocrit 37.5 - 51.0 % 40.6 31.9(L) 30.2(L)  Platelets 150 - 450 x10E3/uL 459(H) 110(L) 109(L)   Iron 84 TIBC 341 Ferritin 122  (on 1/13 - up from 4 on 06/15/19) B12 1204  Radiologic studies: Recent video capsule study report reviewed   @ASSESSMENTPLANBEGIN @ Assessment: Encounter Diagnoses  Name Primary?  . Iron deficiency anemia due to chronic blood loss Yes  . AVM (arteriovenous malformation) of small bowel, acquired with hemorrhage     He has chronic intermittent blood loss from small bowel AVMs.  Despite extensive work-up, this problem remains complex and most likely cannot be definitively solved endoscopically.  Small bowel AVMs typically evanescent, making them difficult to diagnose and treat endoscopically, particularly when they are in the length the small bowel. At this point, I believe our best path forward is hematology referral for close monitor of hemoglobin and iron levels, with periodic IV iron treatments.  I expect that we will be more successful in oral iron in keeping his hemoglobin in a safer range, improving his functional status and try to keep him out of the hospital.  If that is attended to but he still has severe anemia, then strong consideration should be given to permanently discontinuing his oral anticoagulation.  Naturally, that would carry the risk of thromboembolic stroke from his A. fib, however the recurrent severe anemia also presents a high risk to him.    30 minutes were spent on this encounter (including chart review, history/exam, counseling/coordination of care, and documentation)  III

## 2019-07-08 NOTE — Telephone Encounter (Signed)
Internal referral sent to hematology. Will await appointment

## 2019-07-10 ENCOUNTER — Telehealth: Payer: Self-pay | Admitting: Oncology

## 2019-07-10 DIAGNOSIS — I110 Hypertensive heart disease with heart failure: Secondary | ICD-10-CM | POA: Diagnosis not present

## 2019-07-10 DIAGNOSIS — I4891 Unspecified atrial fibrillation: Secondary | ICD-10-CM | POA: Diagnosis not present

## 2019-07-10 DIAGNOSIS — I429 Cardiomyopathy, unspecified: Secondary | ICD-10-CM | POA: Diagnosis not present

## 2019-07-10 DIAGNOSIS — L539 Erythematous condition, unspecified: Secondary | ICD-10-CM | POA: Diagnosis not present

## 2019-07-10 DIAGNOSIS — K31811 Angiodysplasia of stomach and duodenum with bleeding: Secondary | ICD-10-CM | POA: Diagnosis not present

## 2019-07-10 DIAGNOSIS — J449 Chronic obstructive pulmonary disease, unspecified: Secondary | ICD-10-CM | POA: Diagnosis not present

## 2019-07-10 DIAGNOSIS — I5042 Chronic combined systolic (congestive) and diastolic (congestive) heart failure: Secondary | ICD-10-CM | POA: Diagnosis not present

## 2019-07-10 DIAGNOSIS — D649 Anemia, unspecified: Secondary | ICD-10-CM | POA: Diagnosis not present

## 2019-07-10 DIAGNOSIS — I4892 Unspecified atrial flutter: Secondary | ICD-10-CM | POA: Diagnosis not present

## 2019-07-10 NOTE — Telephone Encounter (Signed)
Received a new hem referral from Dr. Myrtie Neither for IDA. Jared [name] has been cld and scheduled to see Dr. Clelia Croft on 2/10 at 11am. Pt has been ,ade aware to arrive 15 minutes early.

## 2019-07-13 DIAGNOSIS — D649 Anemia, unspecified: Secondary | ICD-10-CM | POA: Diagnosis not present

## 2019-07-13 DIAGNOSIS — J449 Chronic obstructive pulmonary disease, unspecified: Secondary | ICD-10-CM | POA: Diagnosis not present

## 2019-07-13 DIAGNOSIS — I5042 Chronic combined systolic (congestive) and diastolic (congestive) heart failure: Secondary | ICD-10-CM | POA: Diagnosis not present

## 2019-07-13 DIAGNOSIS — I4891 Unspecified atrial fibrillation: Secondary | ICD-10-CM | POA: Diagnosis not present

## 2019-07-13 DIAGNOSIS — I4892 Unspecified atrial flutter: Secondary | ICD-10-CM | POA: Diagnosis not present

## 2019-07-13 DIAGNOSIS — K31811 Angiodysplasia of stomach and duodenum with bleeding: Secondary | ICD-10-CM | POA: Diagnosis not present

## 2019-07-13 DIAGNOSIS — I110 Hypertensive heart disease with heart failure: Secondary | ICD-10-CM | POA: Diagnosis not present

## 2019-07-13 DIAGNOSIS — I429 Cardiomyopathy, unspecified: Secondary | ICD-10-CM | POA: Diagnosis not present

## 2019-07-13 DIAGNOSIS — L539 Erythematous condition, unspecified: Secondary | ICD-10-CM | POA: Diagnosis not present

## 2019-07-13 NOTE — Telephone Encounter (Signed)
Pt has been scheduled for a new patient appointment on 07-20-2018 with Dr Clelia Croft.

## 2019-07-17 MED FILL — DULoxetine HCL 60 MG CPEP: 60 | 30 days supply | Qty: 30 | Fill #1

## 2019-07-17 MED FILL — TUDORZA PRESSAIR 400 MCG/AC: 400 | 30 days supply | Qty: 1 | Fill #3

## 2019-07-17 MED FILL — ATORVASTATIN 80 MG TABLET: 80 | 30 days supply | Qty: 30 | Fill #4

## 2019-07-20 ENCOUNTER — Ambulatory Visit: Payer: Medicare HMO | Attending: Family Medicine | Admitting: Pharmacist

## 2019-07-20 ENCOUNTER — Other Ambulatory Visit: Payer: Self-pay

## 2019-07-20 DIAGNOSIS — I4891 Unspecified atrial fibrillation: Secondary | ICD-10-CM | POA: Diagnosis not present

## 2019-07-20 DIAGNOSIS — K31811 Angiodysplasia of stomach and duodenum with bleeding: Secondary | ICD-10-CM | POA: Diagnosis not present

## 2019-07-20 DIAGNOSIS — J449 Chronic obstructive pulmonary disease, unspecified: Secondary | ICD-10-CM | POA: Diagnosis not present

## 2019-07-20 DIAGNOSIS — I4892 Unspecified atrial flutter: Secondary | ICD-10-CM | POA: Diagnosis not present

## 2019-07-20 DIAGNOSIS — I429 Cardiomyopathy, unspecified: Secondary | ICD-10-CM | POA: Diagnosis not present

## 2019-07-20 DIAGNOSIS — Z23 Encounter for immunization: Secondary | ICD-10-CM | POA: Diagnosis not present

## 2019-07-20 DIAGNOSIS — L539 Erythematous condition, unspecified: Secondary | ICD-10-CM | POA: Diagnosis not present

## 2019-07-20 DIAGNOSIS — I110 Hypertensive heart disease with heart failure: Secondary | ICD-10-CM | POA: Diagnosis not present

## 2019-07-20 DIAGNOSIS — I5042 Chronic combined systolic (congestive) and diastolic (congestive) heart failure: Secondary | ICD-10-CM | POA: Diagnosis not present

## 2019-07-20 DIAGNOSIS — K922 Gastrointestinal hemorrhage, unspecified: Secondary | ICD-10-CM | POA: Diagnosis not present

## 2019-07-20 DIAGNOSIS — D649 Anemia, unspecified: Secondary | ICD-10-CM | POA: Diagnosis not present

## 2019-07-20 NOTE — Progress Notes (Signed)
Patient presents for vaccination against Zoster per orders of Dr. Alvis Lemmings (2nd dose). Consent given. Counseling provided. No contraindications exists. Vaccine administered without incident.

## 2019-07-21 ENCOUNTER — Ambulatory Visit: Payer: Self-pay

## 2019-07-21 ENCOUNTER — Inpatient Hospital Stay: Payer: Medicare HMO | Attending: Oncology | Admitting: Oncology

## 2019-07-21 ENCOUNTER — Other Ambulatory Visit: Payer: Self-pay

## 2019-07-21 VITALS — BP 118/83 | HR 117 | Temp 97.8°F | Resp 20 | Ht 75.0 in | Wt 203.9 lb

## 2019-07-21 DIAGNOSIS — I10 Essential (primary) hypertension: Secondary | ICD-10-CM | POA: Diagnosis not present

## 2019-07-21 DIAGNOSIS — F17210 Nicotine dependence, cigarettes, uncomplicated: Secondary | ICD-10-CM | POA: Diagnosis not present

## 2019-07-21 DIAGNOSIS — Z8673 Personal history of transient ischemic attack (TIA), and cerebral infarction without residual deficits: Secondary | ICD-10-CM | POA: Diagnosis not present

## 2019-07-21 DIAGNOSIS — J449 Chronic obstructive pulmonary disease, unspecified: Secondary | ICD-10-CM | POA: Insufficient documentation

## 2019-07-21 DIAGNOSIS — I2510 Atherosclerotic heart disease of native coronary artery without angina pectoris: Secondary | ICD-10-CM | POA: Insufficient documentation

## 2019-07-21 DIAGNOSIS — Z800 Family history of malignant neoplasm of digestive organs: Secondary | ICD-10-CM | POA: Insufficient documentation

## 2019-07-21 DIAGNOSIS — D509 Iron deficiency anemia, unspecified: Secondary | ICD-10-CM | POA: Diagnosis not present

## 2019-07-21 DIAGNOSIS — Z808 Family history of malignant neoplasm of other organs or systems: Secondary | ICD-10-CM

## 2019-07-21 NOTE — Progress Notes (Signed)
Reason for the request:    Anemia  HPI: I was asked by Dr. Loletha Carrow to evaluate Jared [name] for the evaluation of anemia.  He is a 64 year old man with history of coronary disease, COPD among other comorbid conditions.  He was found to have iron deficiency anemia in March of 2020.  He had presented with symptomatic anemia due to upper GI bleed after presenting with hemoglobin of 4.9, dark stool and fecal occult testing that is positive.  He received packed red cell transfusion and then endoscopy showed esophagitis with normal colonoscopy. In January 2021 he presented with hemoglobin of 8.5, iron level of 15, ferritin of 13 with saturation of 4%. That was in the setting of a hospitalization on June 11, 2019 and capsule endoscopy in January 2021 showed no active bleeding or obvious AVM.  IV iron infusion was given and was discharged home.  Repeated iron studies in April 2020 showed improvement in his iron level up to 59.    Clinically, he reports feeling reasonably fair at this time.  Denies any dizziness or lightheadedness.  Denies any chest pain or shortness of breath.  Denies any hematochezia or melena.   He does not report any headaches, blurry vision, syncope or seizures. Does not report any fevers, chills or sweats.  Does not report any cough, wheezing or hemoptysis.  Does not report any chest pain, palpitation, orthopnea or leg edema.  Does not report any nausea, vomiting or abdominal pain.  Does not report any constipation or diarrhea.  Does not report any skeletal complaints.    Does not report frequency, urgency or hematuria.  Does not report any skin rashes or lesions. Does not report any heat or cold intolerance.  Does not report any lymphadenopathy or petechiae.  Does not report any anxiety or depression.  Remaining review of systems is negative.    Past Medical History:  Diagnosis Date  . CAD (coronary artery disease)    a. LHC 5/12:  LAD 20, pLCx 20, pRCA 40, dRCA 40, EF 35%, diff HK  //   b. Myoview 4/16: Overall Impression: High risk stress nuclear study There is no evidence of ischemia. There is severe LV dysfunction. LV Ejection Fraction: 30%. LV Wall Motion: There is global LV hypokinesis.   Marland Kitchen CAP (community acquired pneumonia) 09/2013  . Chronic combined systolic and diastolic CHF (congestive heart failure) (HCC)    a. Echo 4/16:Mild LVH, EF 40-45%, diffuse HK //  b. Echo 8/17: EF 35-40%, diffuse HK, diastolic dysfunction, aortic sclerosis, trivial MR, moderate LAE, normal RVSF, moderate RAE, mild TR, PASP 42 mmHg // c. Echo 4/18: Mild concentric LVH, EF 30-35, normal wall motion, grade 1 diastolic dysfunction, PASP 49  . Cluster headache    "hx; haven't had one in awhile" (01/09/2016)  . COPD (chronic obstructive pulmonary disease) ([place])    [name] 01/09/2016  . History of CVA (cerebrovascular accident)   . Hypertension   . NICM (nonischemic cardiomyopathy) ([place])   . Tobacco abuse   :  Past Surgical History:  Procedure Laterality Date  . CARDIAC CATHETERIZATION  10/2010   LM normal, LAD with 20% irregularities, LCX with 20%, RCA with 40% prox and 40% distal - EF of 35%  . CATARACT EXTRACTION, BILATERAL    . COLONOSCOPY W/ BIOPSIES AND POLYPECTOMY    . COLONOSCOPY WITH PROPOFOL N/A 09/06/2018   Procedure: COLONOSCOPY WITH PROPOFOL;  Surgeon: Thornton Park, MD;  Location: [place];  Service: Gastroenterology;  Laterality: N/A;  . ENTEROSCOPY N/A  09/28/2018   Procedure: ENTEROSCOPY;  Surgeon: Jeani Hawking, MD;  Location: [facility] ENDOSCOPY;  Service: Endoscopy;  Laterality: N/A;  . ENTEROSCOPY N/A 10/28/2018   Procedure: ENTEROSCOPY;  Surgeon: Tressia Danas, MD;  Location: [facility] ENDOSCOPY;  Service: Gastroenterology;  Laterality: N/A;  . ESOPHAGOGASTRODUODENOSCOPY (EGD) WITH PROPOFOL N/A 09/05/2018   Procedure: ESOPHAGOGASTRODUODENOSCOPY (EGD) WITH PROPOFOL;  Surgeon: Benancio Deeds, MD;  Location: [facility] ENDOSCOPY;  Service: Gastroenterology;  Laterality: N/A;  .  EXCISION MASS HEAD    . GIVENS CAPSULE STUDY N/A 09/06/2018   Procedure: GIVENS CAPSULE STUDY;  Surgeon: Tressia Danas, MD;  Location: [facility] ENDOSCOPY;  Service: Gastroenterology;  Laterality: N/A;  . GIVENS CAPSULE STUDY N/A 09/26/2018   Procedure: GIVENS CAPSULE STUDY;  Surgeon: Beverley Fiedler, MD;  Location: [facility] ENDOSCOPY;  Service: Gastroenterology;  Laterality: N/A;  . GIVENS CAPSULE STUDY N/A 06/14/2019   Procedure: GIVENS CAPSULE STUDY;  Surgeon: Napoleon Form, MD;  Location: [facility] ENDOSCOPY;  Service: Endoscopy;  Laterality: N/A;  . HOT HEMOSTASIS N/A 10/28/2018   Procedure: HOT HEMOSTASIS (ARGON PLASMA COAGULATION/BICAP);  Surgeon: Tressia Danas, MD;  Location: [facility] ENDOSCOPY;  Service: Gastroenterology;  Laterality: N/A;  . INCISION AND DRAINAGE PERIRECTAL ABSCESS N/A 06/05/2017   Procedure: IRRIGATION AND DEBRIDEMENT PERIRECTAL ABSCESS;  Surgeon: Andria Meuse, MD;  Location: [facility] OR;  Service: General;  Laterality: N/A;  . VIDEO BRONCHOSCOPY Bilateral 05/08/2016   Procedure: VIDEO BRONCHOSCOPY WITH FLUORO;  Surgeon: Oretha Milch, MD;  Location: [facility] ENDOSCOPY;  Service: Cardiopulmonary;  Laterality: Bilateral;  :   Current Outpatient Medications:  .  albuterol (PROVENTIL) (2.5 MG/3ML) 0.083% nebulizer solution, Take 3 mLs (2.5 mg total) by nebulization every 4 (four) hours as needed for wheezing or shortness of breath., Disp: 75 mL, Rfl: 12 .  albuterol (VENTOLIN HFA) 108 (90 Base) MCG/ACT inhaler, Inhale 2 puffs into the lungs every 6 (six) hours as needed for wheezing or shortness of breath., Disp: 1 Inhaler, Rfl: 5 .  BESIVANCE 0.6 % SUSP, Place 1 drop into the right eye 2 (two) times daily., Disp: , Rfl:  .  carvedilol (COREG) 3.125 MG tablet, Take 1 tablet (3.125 mg total) by mouth 2 (two) times daily., Disp: 60 tablet, Rfl: 6 .  cetirizine (ZYRTEC) 10 MG tablet, Take 1 tablet (10 mg total) by mouth daily., Disp: 30 tablet, Rfl: 1 .  DULoxetine (CYMBALTA) 60 MG capsule, Take  1 capsule (60 mg total) by mouth daily. For paresthesia, Disp: 30 capsule, Rfl: 3 .  fluticasone (FLONASE) 50 MCG/ACT nasal spray, Place 1 spray into both nostrils daily for 30 days., Disp: 0.002 g, Rfl: 0 .  furosemide (LASIX) 40 MG tablet, Take 1 tablet (40 mg total) by mouth daily., Disp: 30 tablet, Rfl: 6 .  gabapentin (NEURONTIN) 300 MG capsule, Take 2 capsules (600 mg total) by mouth at bedtime., Disp: 30 capsule, Rfl: 6 .  losartan (COZAAR) 50 MG tablet, Take 1 tablet (50 mg total) by mouth daily., Disp: 30 tablet, Rfl: 6 .  LOTEMAX 0.5 % ophthalmic suspension, Place 1 drop into the right eye 4 (four) times daily., Disp: , Rfl:  .  Misc. Devices MISC, Portable oxygen concentrator. Diagnosis COPD., Disp: 1 each, Rfl: 0 .  OXYGEN, Inhale into the lungs. 2-3 Liters continuosly, Disp: , Rfl:  .  pantoprazole (PROTONIX) 40 MG tablet, Take 1 tablet (40 mg total) by mouth 2 (two) times daily before a meal., Disp: 60 tablet, Rfl: 0 .  PROLENSA 0.07 % SOLN, Place 1 drop into  the right eye daily., Disp: , Rfl:  .  rivaroxaban (XARELTO) 20 MG TABS tablet, Take 1 tablet (20 mg total) by mouth daily with supper., Disp: 30 tablet, Rfl: 6:  Allergies  Allergen Reactions  . Lisinopril Cough  :  Family History  Problem Relation Age of Onset  . Heart disease Mother   . Diabetes Mother   . Colon cancer Mother   . Liver cancer Mother   . Cancer Father        type unknown  . Diabetes Sister        x 2  . Diabetes Brother   :  Social History   Socioeconomic History  . Marital status: Single    Spouse name: Not on file  . Number of children: 1  . Years of education: Not on file  . Highest education level: Not on file  Occupational History  . Occupation: retired  Tobacco Use  . Smoking status: Current Some Day Smoker    Packs/day: 2.00    Years: 47.00    Pack years: 94.00    Types: Cigarettes  . Smokeless tobacco: Never Used  Substance and Sexual Activity  . Alcohol use: Not Currently     Alcohol/week: 0.0 standard drinks    Comment: last drink was before xmas  . Drug use: No    Types: Cocaine, Marijuana    Comment: "nothing in 20 years"  . Sexual activity: Not Currently  Other Topics Concern  . Not on file  Social History Narrative   unemployed   Social Determinants of Health   Financial Resource Strain:   . Difficulty of Paying Living Expenses: Not on file  Food Insecurity: No Food Insecurity  . Worried About Programme researcher, broadcasting/film/video in the Last Year: Never true  . Ran Out of Food in the Last Year: Never true  Transportation Needs: No Transportation Needs  . Lack of Transportation (Medical): No  . Lack of Transportation (Non-Medical): No  Physical Activity:   . Days of Exercise per Week: Not on file  . Minutes of Exercise per Session: Not on file  Stress:   . Feeling of Stress : Not on file  Social Connections:   . Frequency of Communication with Friends and Family: Not on file  . Frequency of Social Gatherings with Friends and Family: Not on file  . Attends Religious Services: Not on file  . Active Member of Clubs or Organizations: Not on file  . Attends Banker Meetings: Not on file  . Marital Status: Not on file  Intimate Partner Violence:   . Fear of Current or Ex-Partner: Not on file  . Emotionally Abused: Not on file  . Physically Abused: Not on file  . Sexually Abused: Not on file  :  Pertinent items are noted in HPI.  Exam: Blood pressure 118/83, pulse (!) 117, temperature 97.8 F (36.6 C), temperature source Temporal, resp. rate 20, height 6\' 3"  (1.905 m), weight 203 lb 14.4 oz (92.5 kg), SpO2 96 %.  ECOG 1  General appearance: alert and cooperative appeared without distress. Head: atraumatic without any abnormalities. Eyes: conjunctivae/corneas clear. PERRL.  Sclera anicteric. Throat: lips, mucosa, and tongue normal; without oral thrush or ulcers. Resp: clear to auscultation bilaterally without rhonchi, wheezes or dullness to  percussion. Cardio: regular rate and rhythm, S1, S2 normal, no murmur, click, rub or gallop GI: soft, non-tender; bowel sounds normal; no masses,  no organomegaly Skin: Skin color, texture, turgor normal. No rashes or  lesions Lymph nodes: Cervical, supraclavicular, and axillary nodes normal. Neurologic: Grossly normal without any motor, sensory or deep tendon reflexes. Musculoskeletal: No joint deformity or effusion.  CBC    Component Value Date/Time   WBC 9.9 06/24/2019 0945   WBC 6.8 06/15/2019 0724   RBC 4.80 06/24/2019 0945   RBC 3.82 (L) 06/15/2019 0724   HGB 11.5 (L) 06/24/2019 0945   HCT 40.6 06/24/2019 0945   PLT 459 (H) 06/24/2019 0945   MCV 85 06/24/2019 0945   MCH 24.0 (L) 06/24/2019 0945   MCH 22.3 (L) 06/15/2019 0724   MCHC 28.3 (L) 06/24/2019 0945   MCHC 26.6 (L) 06/15/2019 0724   RDW 22.2 (H) 06/24/2019 0945   LYMPHSABS 2.5 06/24/2019 0945   MONOABS 0.7 06/11/2019 1825   EOSABS 0.2 06/24/2019 0945   BASOSABS 0.1 06/24/2019 0945     Assessment and Plan:   64 year old man with:  1.  Iron deficiency anemia dating back to March 2020 after presenting with symptomatic anemia, positive fecal occult testing and presumed upper GI bleed.  He has received packed red cell transfusion, iron infusion currently on oral iron therapy.  Last iron studies obtained on June 24, 2019 showed iron studies of 84 with ferritin of 122.  The differential diagnosis of the natural course of this disease was reviewed today in detail.  His recurrent iron deficiency is presumably to GI bleed although his work-up has been unrevealing for an exact source at this time.  Management options were reviewed at this time which include packed red cell transfusion, intravenous iron as well as oral iron replacement.  At this time he appears to be on adequate iron replacement with replete iron stores and normal hemoglobin.  I recommended no additional IV iron infusion at this time but I will continue to  monitor him closely to ensure he does not have any recurrence and need for repeat IV iron.  He will continue to follow with gastroenterology regarding the potential source of his GI bleed.  2.  Follow-up: Will be in 2 to 3 months for repeat evaluation and repeat iron studies.   45  minutes was spent on this encounter.  The time was dedicated to reviewing laboratory data, differential diagnosis, management options and future plan of care.     Thank you for the referral.  A copy of this consult has been forwarded to the requesting physician.

## 2019-07-22 ENCOUNTER — Telehealth: Payer: Self-pay | Admitting: Oncology

## 2019-07-22 NOTE — Telephone Encounter (Signed)
Scheduled appt per 2/9 los.  Sent a message to HIM pool to get a calendar mailed out. 

## 2019-07-24 DIAGNOSIS — I5042 Chronic combined systolic (congestive) and diastolic (congestive) heart failure: Secondary | ICD-10-CM | POA: Diagnosis not present

## 2019-07-24 DIAGNOSIS — J449 Chronic obstructive pulmonary disease, unspecified: Secondary | ICD-10-CM | POA: Diagnosis not present

## 2019-07-24 DIAGNOSIS — D649 Anemia, unspecified: Secondary | ICD-10-CM | POA: Diagnosis not present

## 2019-07-24 DIAGNOSIS — L539 Erythematous condition, unspecified: Secondary | ICD-10-CM | POA: Diagnosis not present

## 2019-07-24 DIAGNOSIS — K31811 Angiodysplasia of stomach and duodenum with bleeding: Secondary | ICD-10-CM | POA: Diagnosis not present

## 2019-07-24 DIAGNOSIS — I4891 Unspecified atrial fibrillation: Secondary | ICD-10-CM | POA: Diagnosis not present

## 2019-07-24 DIAGNOSIS — I4892 Unspecified atrial flutter: Secondary | ICD-10-CM | POA: Diagnosis not present

## 2019-07-24 DIAGNOSIS — I110 Hypertensive heart disease with heart failure: Secondary | ICD-10-CM | POA: Diagnosis not present

## 2019-07-24 DIAGNOSIS — I429 Cardiomyopathy, unspecified: Secondary | ICD-10-CM | POA: Diagnosis not present

## 2019-07-29 DIAGNOSIS — K31811 Angiodysplasia of stomach and duodenum with bleeding: Secondary | ICD-10-CM | POA: Diagnosis not present

## 2019-07-29 DIAGNOSIS — L539 Erythematous condition, unspecified: Secondary | ICD-10-CM | POA: Diagnosis not present

## 2019-07-29 DIAGNOSIS — D649 Anemia, unspecified: Secondary | ICD-10-CM | POA: Diagnosis not present

## 2019-07-29 DIAGNOSIS — I429 Cardiomyopathy, unspecified: Secondary | ICD-10-CM | POA: Diagnosis not present

## 2019-07-29 DIAGNOSIS — I4891 Unspecified atrial fibrillation: Secondary | ICD-10-CM | POA: Diagnosis not present

## 2019-07-29 DIAGNOSIS — I110 Hypertensive heart disease with heart failure: Secondary | ICD-10-CM | POA: Diagnosis not present

## 2019-07-29 DIAGNOSIS — J449 Chronic obstructive pulmonary disease, unspecified: Secondary | ICD-10-CM | POA: Diagnosis not present

## 2019-07-29 DIAGNOSIS — I5042 Chronic combined systolic (congestive) and diastolic (congestive) heart failure: Secondary | ICD-10-CM | POA: Diagnosis not present

## 2019-07-29 DIAGNOSIS — I4892 Unspecified atrial flutter: Secondary | ICD-10-CM | POA: Diagnosis not present

## 2019-07-29 MED FILL — FUROSEMIDE 40 MG TAB: 40 | 30 days supply | Qty: 30 | Fill #2

## 2019-07-29 MED FILL — CARVEDILOL 3.125 MG TABLET: 3.125 | 30 days supply | Qty: 60 | Fill #1

## 2019-07-29 MED FILL — XARELTO 20 MG TABLET: 20 | 30 days supply | Qty: 30 | Fill #2

## 2019-07-29 MED FILL — LOSARTAN POTASSIUM 50 MG TA: 50 | 30 days supply | Qty: 30 | Fill #2

## 2019-08-18 MED FILL — GABAPENTIN 300 MG CAPSULE: 300 | 30 days supply | Qty: 60 | Fill #1

## 2019-08-18 MED FILL — ALBUTEROL SULFATE HFA 108 (: 108 (90 BAS | 25 days supply | Qty: 18 | Fill #1

## 2019-08-18 MED FILL — TUDORZA PRESSAIR 400 MCG/AC: 400 | 30 days supply | Qty: 1 | Fill #4

## 2019-08-18 MED FILL — ATORVASTATIN 80 MG TABLET: 80 | 30 days supply | Qty: 30 | Fill #5

## 2019-08-18 MED FILL — DULoxetine HCL 60 MG CPEP: 60 | 30 days supply | Qty: 30 | Fill #2

## 2019-09-01 ENCOUNTER — Other Ambulatory Visit: Payer: Self-pay | Admitting: Osteopathic Medicine

## 2019-09-01 MED FILL — LOSARTAN POTASSIUM 50 MG TA: 50 | 90 days supply | Qty: 90 | Fill #3

## 2019-09-01 MED FILL — XARELTO 20 MG TABLET: 20 | 90 days supply | Qty: 90 | Fill #3

## 2019-09-01 MED FILL — FUROSEMIDE 40 MG TAB: 40 | 90 days supply | Qty: 90 | Fill #3

## 2019-09-01 MED FILL — CARVEDILOL 3.125 MG TABLET: 3.125 | 90 days supply | Qty: 180 | Fill #2

## 2019-09-17 ENCOUNTER — Other Ambulatory Visit: Payer: Self-pay

## 2019-09-17 ENCOUNTER — Inpatient Hospital Stay: Payer: Medicare HMO | Attending: Oncology | Admitting: Oncology

## 2019-09-17 ENCOUNTER — Inpatient Hospital Stay: Payer: Medicare HMO

## 2019-09-17 VITALS — BP 138/96 | HR 98 | Temp 98.3°F | Resp 18 | Wt 200.9 lb

## 2019-09-17 DIAGNOSIS — D500 Iron deficiency anemia secondary to blood loss (chronic): Secondary | ICD-10-CM | POA: Insufficient documentation

## 2019-09-17 DIAGNOSIS — D509 Iron deficiency anemia, unspecified: Secondary | ICD-10-CM | POA: Diagnosis not present

## 2019-09-17 DIAGNOSIS — K909 Intestinal malabsorption, unspecified: Secondary | ICD-10-CM | POA: Diagnosis not present

## 2019-09-17 LAB — CBC WITH DIFFERENTIAL (CANCER CENTER ONLY)
Abs Immature Granulocytes: 0.02 10*3/uL (ref 0.00–0.07)
Basophils Absolute: 0 10*3/uL (ref 0.0–0.1)
Basophils Relative: 1 %
Eosinophils Absolute: 0.1 10*3/uL (ref 0.0–0.5)
Eosinophils Relative: 2 %
HCT: 57 % — ABNORMAL HIGH (ref 39.0–52.0)
Hemoglobin: 16.8 g/dL (ref 13.0–17.0)
Immature Granulocytes: 0 %
Lymphocytes Relative: 36 %
Lymphs Abs: 2.8 10*3/uL (ref 0.7–4.0)
MCH: 27.9 pg (ref 26.0–34.0)
MCHC: 29.5 g/dL — ABNORMAL LOW (ref 30.0–36.0)
MCV: 94.5 fL (ref 80.0–100.0)
Monocytes Absolute: 0.7 10*3/uL (ref 0.1–1.0)
Monocytes Relative: 9 %
Neutro Abs: 4.2 10*3/uL (ref 1.7–7.7)
Neutrophils Relative %: 52 %
Platelet Count: 211 10*3/uL (ref 150–400)
RBC: 6.03 MIL/uL — ABNORMAL HIGH (ref 4.22–5.81)
RDW: 15.4 % (ref 11.5–15.5)
WBC Count: 7.9 10*3/uL (ref 4.0–10.5)
nRBC: 0 % (ref 0.0–0.2)

## 2019-09-17 LAB — IRON AND TIBC
Iron: 44 ug/dL (ref 42–163)
Saturation Ratios: 13 % — ABNORMAL LOW (ref 20–55)
TIBC: 343 ug/dL (ref 202–409)
UIBC: 299 ug/dL (ref 117–376)

## 2019-09-17 LAB — FERRITIN: Ferritin: 31 ng/mL (ref 24–336)

## 2019-09-17 NOTE — Progress Notes (Signed)
Hematology and Oncology Follow Up Visit  Jared [name] [id number] Sep 06, 1955 64 y.o. 09/17/2019 7:55 AM Jared [name], MDNewlin, Jared Ferretti, MD   Principle Diagnosis: 64 year old man with iron deficiency anemia diagnosed in March 2020.  His anemia is related to chronic GI losses and poor iron absorption.   Prior Therapy: He is status post iron infusion as well as packed red cell transfusion previously.  Current therapy: Oral iron therapy.  Interim History: Jared [name] returns today for a follow-up visit.  Since the last visit, he reports no major changes in his health.  He denies any chest pain shortness of breath or difficulty breathing.  Denies any nausea, vomiting or recent hospitalizations.  He denies any hematochezia or melena.  Continues to use oxygen intermittently related to his lung disease.     Medications: I have reviewed the patient's current medications.  Current Outpatient Medications  Medication Sig Dispense Refill  . albuterol (PROVENTIL) (2.5 MG/3ML) 0.083% nebulizer solution Take 3 mLs (2.5 mg total) by nebulization every 4 (four) hours as needed for wheezing or shortness of breath. 75 mL 12  . albuterol (VENTOLIN HFA) 108 (90 Base) MCG/ACT inhaler Inhale 2 puffs into the lungs every 6 (six) hours as needed for wheezing or shortness of breath. 1 Inhaler 5  . BESIVANCE 0.6 % SUSP Place 1 drop into the right eye 2 (two) times daily.    . carvedilol (COREG) 3.125 MG tablet Take 1 tablet (3.125 mg total) by mouth 2 (two) times daily. 60 tablet 6  . cetirizine (ZYRTEC) 10 MG tablet Take 1 tablet (10 mg total) by mouth daily. 30 tablet 1  . DULoxetine (CYMBALTA) 60 MG capsule Take 1 capsule (60 mg total) by mouth daily. For paresthesia 30 capsule 3  . fluticasone (FLONASE) 50 MCG/ACT nasal spray Place 1 spray into both nostrils daily for 30 days. 0.002 g 0  . furosemide (LASIX) 40 MG tablet Take 1 tablet (40 mg total) by mouth daily. 30 tablet 6  . gabapentin (NEURONTIN) 300 MG  capsule Take 2 capsules (600 mg total) by mouth at bedtime. 30 capsule 6  . losartan (COZAAR) 50 MG tablet Take 1 tablet (50 mg total) by mouth daily. 30 tablet 6  . LOTEMAX 0.5 % ophthalmic suspension Place 1 drop into the right eye 4 (four) times daily.    . Misc. Devices MISC Portable oxygen concentrator. Diagnosis COPD. 1 each 0  . OXYGEN Inhale into the lungs. 2-3 Liters continuosly    . pantoprazole (PROTONIX) 40 MG tablet Take 1 tablet (40 mg total) by mouth 2 (two) times daily before a meal. 60 tablet 0  . PROLENSA 0.07 % SOLN Place 1 drop into the right eye daily.    . rivaroxaban (XARELTO) 20 MG TABS tablet Take 1 tablet (20 mg total) by mouth daily with supper. 30 tablet 6   No current facility-administered medications for this visit.     Allergies:  Allergies  Allergen Reactions  . Lisinopril Cough    Past Medical History, Surgical history, Social history, and Family History were reviewed and updated.   Physical Exam: Blood pressure (!) 138/96, pulse 98, temperature 98.3 F (36.8 C), temperature source Temporal, resp. rate 18, weight 200 lb 14.4 oz (91.1 kg), SpO2 93 %.   ECOG: 1   General appearance: Comfortable appearing without any discomfort Head: Normocephalic without any trauma Oropharynx: Mucous membranes are moist and pink without any thrush or ulcers. Eyes: Pupils are equal and round reactive to light. Lymph nodes: No  cervical, supraclavicular, inguinal or axillary lymphadenopathy.   Heart:regular rate and rhythm.  S1 and S2 without leg edema. Lung: Clear without any rhonchi or wheezes.  No dullness to percussion. Abdomin: Soft, nontender, nondistended with good bowel sounds.  No hepatosplenomegaly. Musculoskeletal: No joint deformity or effusion.  Full range of motion noted. Neurological: No deficits noted on motor, sensory and deep tendon reflex exam. Skin: No petechial rash or dryness.  Appeared moist.  .    Lab Results: Lab Results  Component  Value Date   WBC 9.9 06/24/2019   HGB 11.5 (L) 06/24/2019   HCT 40.6 06/24/2019   MCV 85 06/24/2019   PLT 459 (H) 06/24/2019     Chemistry      Component Value Date/Time   NA 143 06/15/2019 0724   NA 145 (H) 05/18/2019 1052   K 3.9 06/15/2019 0724   CL 97 (L) 06/15/2019 0724   CO2 38 (H) 06/15/2019 0724   BUN 5 (L) 06/15/2019 0724   BUN 11 05/18/2019 1052   CREATININE 0.74 06/15/2019 0724   CREATININE 1.20 07/30/2016 1431      Component Value Date/Time   CALCIUM 8.8 (L) 06/15/2019 0724   ALKPHOS 60 06/15/2019 0724   AST 18 06/15/2019 0724   ALT 15 06/15/2019 0724   BILITOT 0.7 06/15/2019 0724         Impression and Plan:  64 year old man with:  1.    Anemia related to chronic blood losses and poor iron absorption documented in March 2020.  He was found to have iron deficiency at that time.  He is status post packed red cell transfusion as well as the iron infusion.   Laboratory data from today showed normalization of his hemoglobin at this time with iron studies in January that have also improved.  Treatment options moving forward were reviewed at this time.  I see no need for packed red cell transfusion, intravenous iron or any additional supplementation of growth factor support.  I have recommended discontinuation of iron especially if his iron stores or completely repleted at this time.    2.  Follow-up: I am happy to see him in the future as needed.   20  minutes were dedicated to this visit.  The time was spent on reviewing laboratory data, treatment options and addressing future plan of care.    Jared Hose, MD 4/8/20217:55 AM

## 2019-09-18 MED FILL — ATORVASTATIN 80 MG TABLET: 80 | 30 days supply | Qty: 30 | Fill #6

## 2019-09-18 MED FILL — DULoxetine HCL 60 MG CPEP: 60 | 30 days supply | Qty: 30 | Fill #3

## 2019-10-12 ENCOUNTER — Other Ambulatory Visit: Payer: Self-pay | Admitting: Family Medicine

## 2019-10-12 MED FILL — DULoxetine HCL 60 MG CPEP: 60 | 30 days supply | Qty: 30 | Fill #0

## 2019-10-12 MED FILL — ATORVASTATIN 80 MG TABLET: 80 | 30 days supply | Qty: 30 | Fill #0

## 2019-10-12 MED FILL — TUDORZA PRESSAIR 400 MCG/AC: 400 | 30 days supply | Qty: 1 | Fill #5

## 2019-10-12 MED FILL — GABAPENTIN 300 MG CAPSULE: 300 | 30 days supply | Qty: 60 | Fill #2

## 2019-11-16 ENCOUNTER — Other Ambulatory Visit: Payer: Self-pay | Admitting: Internal Medicine

## 2019-11-16 NOTE — Telephone Encounter (Signed)
Plan was to change to anoro but if tudorza is the only maint rx he's been using then ok to refill  (anoro has LAMA= tudorza in it so can't use both)

## 2019-11-17 ENCOUNTER — Telehealth: Payer: Self-pay | Admitting: Internal Medicine

## 2019-11-17 MED ORDER — ACLIDINIUM BROMIDE 400 MCG/ACT IN AEPB: 1.0000 | INHALATION_SPRAY | Freq: Two times a day (BID) | RESPIRATORY_TRACT | 3 refills | Status: DC

## 2019-11-17 NOTE — Telephone Encounter (Signed)
As long as that's the only maint med that's fine - should just be on tudorza and prn albuterol

## 2019-11-17 NOTE — Telephone Encounter (Signed)
Spoke with pt and advised rx for Jared [name] was sent to pharmacy. Nothing further is needed.

## 2019-11-17 NOTE — Telephone Encounter (Signed)
Spoke with pt, he states he has been on [name] since he saw MW in January. The inhaler wasn't on his list so I wanted to make sure that I could send in. MW please advise.    Assessment & Plan Note by Nyoka Cowden, MD at 06/30/2019 7:46 PM Author: Nyoka Cowden, MD Author Type: Physician Filed: 06/30/2019 7:47 PM  Note Status: Written Cosign: Cosign Not Required Encounter Date: 06/30/2019  Problem: COPD GOLD III/still smoking   Editor: Nyoka Cowden, MD (Physician)    Active smoker 02/29/2016  After extensive coaching HFA effectiveness =    75% > continue symbicort  - Spirometry 05/28/2016  FEV1 1.48 (40%)  Ratio 46  p am symbicort 160 2bid  - 06/26/2016  After extensive coaching HFA effectiveness =    90% > bevespi 2 bid and change coreg to bisoprolol 5 mg daily > not done as of 07/24/2016 > at ov 10/22/2016 off coreg and all inhalers and much better sob but still some cough on entresto maint - Spirometry 10/22/2016  FEV1 1.24 (35%)  Ratio 48 - 12/31/2018    tudorza rx one puff bid  - 01/28/2019  After extensive coaching inhaler device,  effectiveness =    90% with tudorza  03/09/2019 Reports improvement with [name] but not covered, refer to [facility]. May need PA processed- he has been on bevespi and spiriva in the past  - 06/30/2019  After extensive coaching inhaler device,  effectiveness =    90% with elipta try anoro one each am

## 2019-12-03 MED FILL — CARVEDILOL 3.125 MG TABLET: 3.125 | 60 days supply | Qty: 120 | Fill #3

## 2019-12-03 MED FILL — LOSARTAN POTASSIUM 50 MG TA: 50 | 30 days supply | Qty: 30 | Fill #4

## 2019-12-03 MED FILL — XARELTO 20 MG TABLET: 20 | 30 days supply | Qty: 30 | Fill #4

## 2019-12-16 MED FILL — ATORVASTATIN 80 MG TABLET: 80 | 30 days supply | Qty: 30 | Fill #2

## 2019-12-16 MED FILL — DULoxetine HCL 60 MG CPEP: 60 | 30 days supply | Qty: 30 | Fill #2

## 2019-12-22 ENCOUNTER — Other Ambulatory Visit: Payer: Self-pay | Admitting: Family Medicine

## 2019-12-22 DIAGNOSIS — I110 Hypertensive heart disease with heart failure: Secondary | ICD-10-CM

## 2019-12-22 MED FILL — GABAPENTIN 300 MG CAPSULE: 300 | 15 days supply | Qty: 30 | Fill #3

## 2019-12-22 MED FILL — TUDORZA PRESSAIR 400 MCG/AC: 400 | 30 days supply | Qty: 1 | Fill #1

## 2019-12-29 ENCOUNTER — Ambulatory Visit (INDEPENDENT_AMBULATORY_CARE_PROVIDER_SITE_OTHER): Payer: Medicare HMO | Admitting: Pulmonary Disease

## 2019-12-29 ENCOUNTER — Encounter: Payer: Self-pay | Admitting: Pulmonary Disease

## 2019-12-29 ENCOUNTER — Ambulatory Visit: Payer: Medicare HMO | Admitting: Internal Medicine

## 2019-12-29 ENCOUNTER — Other Ambulatory Visit: Payer: Self-pay

## 2019-12-29 VITALS — BP 114/68 | HR 99 | Temp 98.2°F | Ht 75.0 in | Wt 207.0 lb

## 2019-12-29 DIAGNOSIS — J9611 Chronic respiratory failure with hypoxia: Secondary | ICD-10-CM | POA: Diagnosis not present

## 2019-12-29 DIAGNOSIS — J9612 Chronic respiratory failure with hypercapnia: Secondary | ICD-10-CM

## 2019-12-29 DIAGNOSIS — F17210 Nicotine dependence, cigarettes, uncomplicated: Secondary | ICD-10-CM | POA: Diagnosis not present

## 2019-12-29 DIAGNOSIS — J449 Chronic obstructive pulmonary disease, unspecified: Secondary | ICD-10-CM | POA: Diagnosis not present

## 2019-12-29 MED ORDER — NICOTINE POLACRILEX 4 MG MT LOZG: 4.0000 mg | LOZENGE | OROMUCOSAL | 3 refills | Status: DC | PRN

## 2019-12-29 NOTE — Progress Notes (Signed)
  ID: Jared [name], male    DOB: 01-22-1956, 64 y.o.   MRN: [id number]  Chief Complaint  Patient presents with  . Follow-up    COPD    Referring provider: [name], MD  HPI:  63 year old male current smoker followed in our office for COPD  PMH: Hypertension, CAD, prediabetes, CHF, atrial flutter Smoker/ Smoking History: Current smoker.  94-pack-year smoking history. 5 cigarettes  Maintenance: Tudorza Pt of: Dr. [name]  12/29/2019  - Visit   64 year old male current smoker followed in our office for COPD.  He is followed by Dr. [name].  Patient's last visit with our office was in January/2021.  Patient presenting to office today reporting that he is doing well.  He feels that his breathing has significantly improved since starting [name].  This was provided at the last office visit has been refilled in March/2021.  He reports using this every day every 12 hours.  He has to use his rescue inhaler about 1-2 times a day.  He reports in the last week he is used it 4 times.  He does still continue to smoke 5 cigarettes a day.  He is interested in stopping smoking but he is not ready to set a quit date.  He has patches at home.  He has never used lozenges.  He is open to using this.  We will discuss this today.  Patient does have a baseline cough with white to clear mucus.  He does not feel he is making more mucus than usual.  He has slight audible wheezing at times.  Overall though he reports that this is significantly improved.  He reports he has not needed antibiotics in the last 3 months.  Patient reports he is not been using his oxygen for the last 3 to 4 months.  We will evaluate this today.  Walk today in office with physical exertion on the third lab on room air patient dropped to 87%.  Oxygen was applied.  It was found that patient needed 2 L of O2.  Questionaires / Pulmonary Flowsheets:   ACT:  No flowsheet data found.  MMRC: mMRC Dyspnea Scale mMRC Score    12/29/2019 1    Epworth:  No flowsheet data found.  Tests:   01/07/2019-CT chest lung cancer screening-lung RADS 2, benign appearance or behavior, emphysema, aortic arthrosclerosis  10/22/2016-spirometry-FVC 2.6 (57% predicted), ratio 48, FEV1 1.2 (35% predicted)  05/02/2018-echocardiogram-LV ejection fraction 45 to 50%, grade 1 diastolic dysfunction, atrium mildly dilated, EF had previously been 35%  FENO:  No results found for: NITRICOXIDE  PFT: No flowsheet data found.  WALK:  SIX MIN WALK 12/29/2019 01/28/2019 11/19/2018 10/22/2016  Supplimental Oxygen during Test? (L/min) No Yes Yes No  O2 Flow Rate - 3 2 -  Type - Pulse Continuous -  Tech Comments: Average pace with some desats. Pt. walked at a steady pace. He dropped to 88 at the end of lap one and we increased oxygen to 3L. Pt. was very sob.ER Patient walked at steady pace. Had to stop due to SOB during lap two. After sitting for a few mintues he was able to finish second lap. ER moderate pace/No SOB//lmr    Imaging: No results found.  Lab Results:  CBC    Component Value Date/Time   WBC 7.9 09/17/2019 0748   WBC 6.8 06/15/2019 0724   RBC 6.03 (H) 09/17/2019 0748   HGB 16.8 09/17/2019 0748   HGB 11.5 (L) 06/24/2019 0945   HCT 57.0 (  H) 09/17/2019 0748   HCT 40.6 06/24/2019 0945   PLT 211 09/17/2019 0748   PLT 459 (H) 06/24/2019 0945   MCV 94.5 09/17/2019 0748   MCV 85 06/24/2019 0945   MCH 27.9 09/17/2019 0748   MCHC 29.5 (L) 09/17/2019 0748   RDW 15.4 09/17/2019 0748   RDW 22.2 (H) 06/24/2019 0945   LYMPHSABS 2.8 09/17/2019 0748   LYMPHSABS 2.5 06/24/2019 0945   MONOABS 0.7 09/17/2019 0748   EOSABS 0.1 09/17/2019 0748   EOSABS 0.2 06/24/2019 0945   BASOSABS 0.0 09/17/2019 0748   BASOSABS 0.1 06/24/2019 0945    BMET    Component Value Date/Time   NA 143 06/15/2019 0724   NA 145 (H) 05/18/2019 1052   K 3.9 06/15/2019 0724   CL 97 (L) 06/15/2019 0724   CO2 38 (H) 06/15/2019 0724   GLUCOSE 84  06/15/2019 0724   BUN 5 (L) 06/15/2019 0724   BUN 11 05/18/2019 1052   CREATININE 0.74 06/15/2019 0724   CREATININE 1.20 07/30/2016 1431   CALCIUM 8.8 (L) 06/15/2019 0724   GFRNONAA >60 06/15/2019 0724   GFRNONAA 64 07/30/2016 1431   GFRAA >60 06/15/2019 0724   GFRAA 74 07/30/2016 1431    BNP    Component Value Date/Time   BNP 373.4 (H) 06/11/2019 1825   BNP 1,458.6 (H) 07/30/2016 1431    ProBNP    Component Value Date/Time   PROBNP 2,541 (H) 08/03/2016 1330   PROBNP 330.0 (H) 11/18/2013 0829    Specialty Problems      Pulmonary Problems   Centrilobular emphysema (HCC)   COPD GOLD III/still smoking     Active smoker 02/29/2016  After extensive coaching HFA effectiveness =    75% > continue symbicort  - Spirometry 05/28/2016  FEV1 1.48 (40%)  Ratio 46  p am symbicort 160 2bid  - 06/26/2016  After extensive coaching HFA effectiveness =    90% > bevespi 2 bid and change coreg to bisoprolol 5 mg daily > not done as of 07/24/2016 > at ov 10/22/2016 off coreg and all inhalers and much better sob but still some cough on entresto maint - Spirometry 10/22/2016  FEV1 1.24 (35%)  Ratio 48 - 12/31/2018    tudorza rx one puff bid  - 01/28/2019  After extensive coaching inhaler device,  effectiveness =    90% with tudorza  03/09/2019 Reports improvement with [name] but not covered, refer to [facility]. May need PA processed- he has been on bevespi and spiriva in the past  - 06/30/2019  After extensive coaching inhaler device,  effectiveness =    90% with elipta try anoro one each am         Hemoptysis   COPD with acute exacerbation (HCC)   Chronic respiratory failure with hypoxia (HCC)   Acute on chronic respiratory failure with hypoxia and hypercapnia (HCC)   Chronic respiratory failure with hypoxia and hypercapnia (HCC)    See copd - 10/22/2016  Walked RA x 3 laps @ 185 ft each stopped due to  End of study moderate pace, no desat  - HCO03  11/11/18  = 41  - 12/31/2018   Walked 2lpm   2 laps @   approx 242ft each @ avg pace  stopped due to  Sob on 2lpm but no desats   - 01/28/2019   Walked RA x one lap =  approx 250 ft - stopped due to  Sob with sats ok on 3lpm POC > referred for  POC  - HCO3  06/15/19 = 38       Chronic right maxillary sinusitis    Sinus CT 11/09/18 : Odontogenic right maxillary sinusitis with fluid level -  01/28/2019  advised dental f/u asap and if not better p that see ent         Allergies  Allergen Reactions  . Lisinopril Cough    Immunization History  Administered Date(s) Administered  . Fluad Quad(high Dose 65+) 03/09/2019  . Influenza,inj,Quad PF,6+ Mos 03/20/2016, 05/02/2018  . Influenza-Unspecified 03/28/2017  . PFIZER SARS-COV-2 Vaccination 08/12/2019, 09/10/2019  . Pneumococcal Polysaccharide-23 01/10/2016, 05/02/2018, 05/18/2019  . Zoster Recombinat (Shingrix) 05/18/2019, 07/20/2019    Past Medical History:  Diagnosis Date  . CAD (coronary artery disease)    a. LHC 5/12:  LAD 20, pLCx 20, pRCA 40, dRCA 40, EF 35%, diff HK  //  b. Myoview 4/16: Overall Impression: High risk stress nuclear study There is no evidence of ischemia. There is severe LV dysfunction. LV Ejection Fraction: 30%. LV Wall Motion: There is global LV hypokinesis.   Marland Kitchen CAP (community acquired pneumonia) 09/2013  . Chronic combined systolic and diastolic CHF (congestive heart failure) (HCC)    a. Echo 4/16:Mild LVH, EF 40-45%, diffuse HK //  b. Echo 8/17: EF 35-40%, diffuse HK, diastolic dysfunction, aortic sclerosis, trivial MR, moderate LAE, normal RVSF, moderate RAE, mild TR, PASP 42 mmHg // c. Echo 4/18: Mild concentric LVH, EF 30-35, normal wall motion, grade 1 diastolic dysfunction, PASP 49  . Cluster headache    "hx; haven't had one in awhile" (01/09/2016)  . COPD (chronic obstructive pulmonary disease) (HCC)    [name] 01/09/2016  . History of CVA (cerebrovascular accident)   . Hypertension   . NICM (nonischemic cardiomyopathy) (HCC)   . Tobacco abuse     Tobacco  History: Social History   Tobacco Use  Smoking Status Current Some Day Smoker  . Packs/day: 2.00  . Years: 47.00  . Pack years: 94.00  . Types: Cigarettes  Smokeless Tobacco Never Used   Ready to quit: Yes Counseling given: Yes  Smoking assessment and cessation counseling  Patient currently smoking: 5 cigarettes  I have advised the patient to quit/stop smoking as soon as possible due to high risk for multiple medical problems.  It will also be very difficult for [place] to manage patient's  respiratory symptoms and status if we continue to expose her lungs to a known irritant.  We do not advise e-cigarettes as a form of stopping smoking.  Patient is or is not willing to quit smoking.  Willing to use lozenges. Will send script today.  Has 21mg  patches.   I have advised the patient that we can assist and have options of nicotine replacement therapy, provided smoking cessation education today, provided smoking cessation counseling, and provided cessation resources.  Follow-up next office visit office visit for assessment of smoking cessation.  Smoking cessation counseling advised for: 6 min     Outpatient Encounter Medications as of 12/29/2019  Medication Sig  . Aclidinium Bromide 400 MCG/ACT AEPB Inhale 1 puff into the lungs in the morning and at bedtime.  12/31/2019 albuterol (PROVENTIL) (2.5 MG/3ML) 0.083% nebulizer solution Take 3 mLs (2.5 mg total) by nebulization every 4 (four) hours as needed for wheezing or shortness of breath.  Marland Kitchen albuterol (VENTOLIN HFA) 108 (90 Base) MCG/ACT inhaler Inhale 2 puffs into the lungs every 6 (six) hours as needed for wheezing or shortness of breath.  . BESIVANCE 0.6 % SUSP  Place 1 drop into the right eye 2 (two) times daily.  . carvedilol (COREG) 3.125 MG tablet Take 1 tablet (3.125 mg total) by mouth 2 (two) times daily.  . cetirizine (ZYRTEC) 10 MG tablet Take 1 tablet (10 mg total) by mouth daily.  . DULoxetine (CYMBALTA) 60 MG capsule TAKE 1 CAPSULE  (60 MG TOTAL) BY MOUTH DAILY. FOR PARESTHESIA  . furosemide (LASIX) 40 MG tablet TAKE 1 TABLET (40 MG TOTAL) BY MOUTH DAILY.  Marland Kitchen gabapentin (NEURONTIN) 300 MG capsule Take 2 capsules (600 mg total) by mouth at bedtime.  Marland Kitchen losartan (COZAAR) 50 MG tablet Take 1 tablet (50 mg total) by mouth daily.  Marland Kitchen LOTEMAX 0.5 % ophthalmic suspension Place 1 drop into the right eye 4 (four) times daily.  . Misc. Devices MISC Portable oxygen concentrator. Diagnosis COPD.  Marland Kitchen OXYGEN Inhale into the lungs. 2-3 Liters continuosly  . pantoprazole (PROTONIX) 40 MG tablet Take 1 tablet (40 mg total) by mouth 2 (two) times daily before a meal.  . PROLENSA 0.07 % SOLN Place 1 drop into the right eye daily.  . rivaroxaban (XARELTO) 20 MG TABS tablet Take 1 tablet (20 mg total) by mouth daily with supper.  . fluticasone (FLONASE) 50 MCG/ACT nasal spray Place 1 spray into both nostrils daily for 30 days.  . nicotine polacrilex (COMMIT) 4 MG lozenge Take 1 lozenge (4 mg total) by mouth as needed for smoking cessation.   No facility-administered encounter medications on file as of 12/29/2019.     Review of Systems  Review of Systems  Constitutional: Negative for activity change, chills, fatigue, fever and unexpected weight change.  HENT: Negative for postnasal drip, rhinorrhea, sinus pressure, sinus pain and sore throat.   Eyes: Negative.   Respiratory: Positive for cough and wheezing (little - improved ). Negative for shortness of breath.   Cardiovascular: Negative for chest pain and palpitations.  Gastrointestinal: Negative for constipation, diarrhea, nausea and vomiting.  Endocrine: Negative.   Genitourinary: Negative.   Musculoskeletal: Negative.   Skin: Negative.   Neurological: Negative for dizziness and headaches.  Psychiatric/Behavioral: Negative.  Negative for dysphoric mood. The patient is not nervous/anxious.   All other systems reviewed and are negative.    Physical Exam  BP 114/68 (BP Location: Left  Arm, Cuff Size: Normal)   Pulse 99   Temp 98.2 F (36.8 C) (Oral)   Ht 6\' 3"  (1.905 m)   Wt 207 lb (93.9 kg)   SpO2 96%   BMI 25.87 kg/m   Wt Readings from Last 5 Encounters:  12/29/19 207 lb (93.9 kg)  09/17/19 200 lb 14.4 oz (91.1 kg)  07/21/19 203 lb 14.4 oz (92.5 kg)  07/08/19 196 lb 8 oz (89.1 kg)  06/30/19 189 lb (85.7 kg)    BMI Readings from Last 5 Encounters:  12/29/19 25.87 kg/m  09/17/19 25.11 kg/m  07/21/19 25.49 kg/m  07/08/19 24.56 kg/m  06/30/19 23.62 kg/m     Physical Exam Vitals and nursing note reviewed.  Constitutional:      General: He is not in acute distress.    Appearance: Normal appearance. He is obese.  HENT:     Head: Normocephalic and atraumatic.     Right Ear: Hearing, tympanic membrane, ear canal and external ear normal. There is impacted cerumen.     Left Ear: Hearing, tympanic membrane, ear canal and external ear normal. There is impacted cerumen.     Nose: Rhinorrhea present. No mucosal edema.     Right  Turbinates: Not enlarged.     Left Turbinates: Not enlarged.     Mouth/Throat:     Mouth: Mucous membranes are dry. No oral lesions.     Dentition: Normal dentition.     Pharynx: Oropharynx is clear. No oropharyngeal exudate.     Comments: +pnd Eyes:     Pupils: Pupils are equal, round, and reactive to light.  Cardiovascular:     Rate and Rhythm: Normal rate and regular rhythm.     Pulses: Normal pulses.     Heart sounds: Normal heart sounds. No murmur heard.   Pulmonary:     Effort: Pulmonary effort is normal.     Breath sounds: Normal breath sounds. No decreased breath sounds, wheezing or rales.  Musculoskeletal:     Cervical back: Normal range of motion.     Right lower leg: No edema.     Left lower leg: No edema.  Lymphadenopathy:     Cervical: No cervical adenopathy.  Skin:    General: Skin is warm and dry.     Capillary Refill: Capillary refill takes less than 2 seconds.     Findings: No erythema or rash.    Neurological:     General: No focal deficit present.     Mental Status: He is alert and oriented to person, place, and time.     Motor: No weakness.     Coordination: Coordination normal.     Gait: Gait is intact. Gait normal.  Psychiatric:        Mood and Affect: Mood normal.        Behavior: Behavior normal. Behavior is cooperative.        Thought Content: Thought content normal.        Judgment: Judgment normal.       Assessment & Plan:   COPD GOLD III/still smoking  Plan: Continue Tudorza Continue rescue inhaler 57-month follow-up with pulmonary function testing Emphasized need to stop smoking Walk today in office  Cigarette smoker Current everyday smoker Smoking 5 cigarettes a day Working on decreasing smoking Has nicotine patches at home Is interested in using lozenges Established in lung cancer screening program, due for repeat CT at end of the month  Plan: Emphasized need to stop smoking Start using 21 mg nicotine patches Prescription for 4 mg lozenges provided today We will reach out to lung cancer screening program to ensure you receive your CT at the scheduled time 62-month follow-up with Dr. [name] with pulmonary function testing  Chronic respiratory failure with hypoxia and hypercapnia (HCC) Patient reporting today has not worn his oxygen with exertion or at night for the last 3 months DME company requesting an order for continuation of oxygen Walk today in office, on third lap on room air patient dropped to 87%, patient placed on 2 L of O2 and maintain oxygen saturations above 90%  Plan: Continue oxygen 2 L with physical exertion and 2 L at night We will order overnight oximetry to evaluate oxygen needs at night on room air Follow-up in 3 months    Return in about 3 months (around 03/30/2020), or if symptoms worsen or fail to improve, for Follow up with Dr. [name], Follow up for FULL PFT - 60 min.   Coral Ceo, NP 12/29/2019   This appointment  required 34 minutes of patient care (this includes precharting, chart review, review of results, face-to-face care, etc.).

## 2019-12-29 NOTE — Assessment & Plan Note (Signed)
Current everyday smoker Smoking 5 cigarettes a day Working on decreasing smoking Has nicotine patches at home Is interested in using lozenges Established in lung cancer screening program, due for repeat CT at end of the month  Plan: Emphasized need to stop smoking Start using 21 mg nicotine patches Prescription for 4 mg lozenges provided today We will reach out to lung cancer screening program to ensure you receive your CT at the scheduled time 59-month follow-up with Dr. [name] with pulmonary function testing

## 2019-12-29 NOTE — Patient Instructions (Addendum)
You were seen today by Coral Ceo, NP  For:   1. COPD GOLD III/still smoking   - Pulmonary function test; Future  Jared [name]  >>> 1 puff inhaled every 12 hours  >>> do this everyday no matter what   Only use your albuterol as a rescue medication to be used if you can't catch your breath by resting or doing a relaxed purse lip breathing pattern.  - The less you use it, the better it will work when you need it. - Ok to use up to 2 puffs  every 4 hours if you must but call for immediate appointment if use goes up over your usual need - Don't leave home without it !!  (think of it like the spare tire for your car)   Note your daily symptoms > remember "red flags" for COPD:   >>>Increase in cough >>>increase in sputum production >>>increase in shortness of breath or activity  intolerance.   If you notice these symptoms, please call the office to be seen.    2. Chronic respiratory failure with hypoxia and hypercapnia (HCC)  Walk today in office >>> You require 2 L with physical exertion  Continue oxygen therapy as prescribed - 2L with exertion and at night  >>>maintain oxygen saturations greater than 88 percent  >>>if unable to maintain oxygen saturations please contact the office  >>>do not smoke with oxygen  >>>can use nasal saline gel or nasal saline rinses to moisturize nose if oxygen causes dryness  I will order an overnight oximetry test on room air (do not wear oxygen when doing this test) to evaluate if you need oxygen at night  3. Cigarette smoker  We will contact the lung cancer screening team to make sure there coordinating her follow-up CT that is due at the end of this month  We recommend that you stop smoking.  >>>You need to set a quit date >>>If you have friends or family who smoke, let them know you are trying to quit and not to smoke around you or in your living environment  Smoking Cessation Resources:  1 800 QUIT NOW  >>> Patient to call this  resource and utilize it to help support her quit smoking >>> Keep up your hard work with stopping smoking  You can also contact the [facility] >>>For smoking cessation classes call [phone]  We do not recommend using e-cigarettes as a form of stopping smoking  You can sign up for smoking cessation support texts and information:  >>>https://smokefree.gov/smokefreetxt  Nicotine patches: >>>Make sure you rotate sites that you do not get skin irritation, Apply 1 patch each morning to a non-hairy skin site  If you are smoking greater than 10 cigarettes/day and weigh over 45 kg start with the nicotine patch of 21 mg a day for 6 weeks, then 14 mg a day for 2 weeks, then finished with 7 mg a day for 2 weeks, then stop  If you are smoking less than 10 cigarettes a day or weight less than 45 kg start with medium dose pack of 14 mg a day for 6 weeks, followed by 7 mg a day for 2 weeks   >>>If insomnia occurs you are having trouble sleeping you can take the patch off at night, and place a new one on in the morning >>>If the patch is removed at night and you have morning cravings start short acting nicotine replacement therapy such as gum or lozenges  Nicotine lozenge:  Lozenges are commonly uses short acting NRT product  >>>Smokers who smoke within 30 minutes of awakening should use 4 mg dose >>>Smokers who wait more than 30 minutes after awakening to smoke should use 2 mg dose  Can use up to 1 lozenge every 1-2 hours for 6 weeks >>>Total amount of lozenges that can be used per day as 20 >>>Gradually reduce number of lozenges used per day after 2 weeks of use  Place lozenge in mouth and allowed to dissolve for 30 minutes loss and does not need to be chewed  Lozenges have advantages to be able to be used in people with TMG, poor dentition, dentures    We recommend today:  Orders Placed This Encounter  Procedures  . Pulmonary function test    Standing Status:   Future     Standing Expiration Date:   12/28/2020    Order Specific Question:   Where should this test be performed?    Answer:   [facility]    Order Specific Question:   Full PFT: includes the following: basic spirometry, spirometry pre & post bronchodilator, diffusion capacity (DLCO), lung volumes    Answer:   Full PFT   Orders Placed This Encounter  Procedures  . Pulmonary function test   No orders of the defined types were placed in this encounter.   Follow Up:    Return in about 3 months (around 03/30/2020), or if symptoms worsen or fail to improve, for Follow up with Dr. [name], Follow up for FULL PFT - 60 min.   Please do your part to reduce the spread of COVID-19:      Reduce your risk of any infection  and COVID19 by using the similar precautions used for avoiding the common cold or flu:  Marland Kitchen Wash your hands often with soap and warm water for at least 20 seconds.  If soap and water are not readily available, use an alcohol-based hand sanitizer with at least 60% alcohol.  . If coughing or sneezing, cover your mouth and nose by coughing or sneezing into the elbow areas of your shirt or coat, into a tissue or into your sleeve (not your hands). Drinda Butts A MASK when in public  . Avoid shaking hands with others and consider head nods or verbal greetings only. . Avoid touching your eyes, nose, or mouth with unwashed hands.  . Avoid close contact with people who are sick. . Avoid places or events with large numbers of people in one location, like concerts or sporting events. . If you have some symptoms but not all symptoms, continue to monitor at home and seek medical attention if your symptoms worsen. . If you are having a medical emergency, call 911.   ADDITIONAL HEALTHCARE OPTIONS FOR PATIENTS  [facility] / e-Visit: [email or web address]         [facility]: [phone]  [facility]: [phone]                    [facility]: [phone]     It is flu season:   >>> Best ways to protect herself from the flu: Receive the yearly flu vaccine, practice good hand hygiene washing with soap and also using hand sanitizer when available, eat a nutritious meals, get adequate rest, hydrate appropriately   Please contact the office if your symptoms worsen or you have concerns that you are not improving.   Thank you for choosing [facility]  for your healthcare, and for allowing [place] to partner with you on your healthcare journey. I am thankful to be able to provide care to you today.   Wyn Quaker FNP-C

## 2019-12-29 NOTE — Assessment & Plan Note (Signed)
Patient reporting today has not worn his oxygen with exertion or at night for the last 3 months DME company requesting an order for continuation of oxygen Walk today in office, on third lap on room air patient dropped to 87%, patient placed on 2 L of O2 and maintain oxygen saturations above 90%  Plan: Continue oxygen 2 L with physical exertion and 2 L at night We will order overnight oximetry to evaluate oxygen needs at night on room air Follow-up in 3 months

## 2019-12-29 NOTE — Assessment & Plan Note (Signed)
Plan: Continue Tudorza Continue rescue inhaler 77-month follow-up with pulmonary function testing Emphasized need to stop smoking Walk today in office

## 2019-12-31 ENCOUNTER — Telehealth: Payer: Self-pay | Admitting: Internal Medicine

## 2019-12-31 NOTE — Telephone Encounter (Signed)
I have not received anything  Called Mark at [employer] and placed on hold x 10 min  Will call back and tell them to fax to the [place] office at [phone]

## 2020-01-05 NOTE — Telephone Encounter (Signed)
Called [employer] and spoke with Leta Jungling to see if fax had been taken care of. Leta Jungling stated that he was going to transfer me to [facility] for me to speak with him about this. Loraine Leriche did get on the line and when I tried to give him the fax number for the [place] office per Verlon Au to have the fax sent there, the call got dropped.  Will try to call back later.

## 2020-01-07 NOTE — Telephone Encounter (Signed)
Sent a community message to [employer] to have this form refaxed to the [place] office.

## 2020-01-18 ENCOUNTER — Other Ambulatory Visit: Payer: Self-pay

## 2020-01-18 ENCOUNTER — Ambulatory Visit
Admission: RE | Admit: 2020-01-18 | Discharge: 2020-01-18 | Disposition: A | Discharge status: Home / Self Care | Payer: Medicare HMO | Source: Ambulatory Visit | Attending: Acute Care | Admitting: Acute Care

## 2020-01-18 DIAGNOSIS — Z87891 Personal history of nicotine dependence: Secondary | ICD-10-CM

## 2020-01-18 DIAGNOSIS — F17210 Nicotine dependence, cigarettes, uncomplicated: Secondary | ICD-10-CM

## 2020-01-18 DIAGNOSIS — Z122 Encounter for screening for malignant neoplasm of respiratory organs: Secondary | ICD-10-CM

## 2020-01-19 ENCOUNTER — Other Ambulatory Visit: Payer: Self-pay | Admitting: *Deleted

## 2020-01-19 DIAGNOSIS — Z87891 Personal history of nicotine dependence: Secondary | ICD-10-CM

## 2020-01-19 DIAGNOSIS — F17210 Nicotine dependence, cigarettes, uncomplicated: Secondary | ICD-10-CM

## 2020-01-19 NOTE — Progress Notes (Signed)
Please call patient and let them  know their  low dose Ct was read as a Lung RADS 2: nodules that are benign in appearance and behavior with a very low likelihood of becoming a clinically active cancer due to size or lack of growth. Recommendation per radiology is for a repeat LDCT in 12 months. .Please let them  know we will order and schedule their  annual screening scan for 01/2021. Please let them  know there was notation of CAD on their  scan.  Please remind the patient  that this is a non-gated exam therefore degree or severity of disease  cannot be determined. Please have them  follow up with their PCP regarding potential risk factor modification, dietary therapy or pharmacologic therapy if clinically indicated. Pt.  is not  currently on statin therapy. Please place order for annual  screening scan for  01/2021 and fax results to PCP. Thanks so much.  Angelique Blonder, he is seen by pulmonary already.   Arlys John, may want to consider a repeat echo to see if he has PAH, as his pulmonic trunk appears enlarged on CT.

## 2020-01-20 MED FILL — DULoxetine HCL 60 MG CPEP: 60 | 30 days supply | Qty: 30 | Fill #3

## 2020-01-20 NOTE — Progress Notes (Signed)
Noted ok thanks Milinda Hirschfeld

## 2020-01-26 MED FILL — TUDORZA PRESSAIR 400 MCG/AC: 400 | 30 days supply | Qty: 1 | Fill #2

## 2020-01-29 ENCOUNTER — Encounter: Payer: Self-pay | Admitting: Internal Medicine

## 2020-01-29 ENCOUNTER — Ambulatory Visit (INDEPENDENT_AMBULATORY_CARE_PROVIDER_SITE_OTHER): Payer: Medicare HMO | Admitting: Internal Medicine

## 2020-01-29 ENCOUNTER — Other Ambulatory Visit: Payer: Self-pay

## 2020-01-29 VITALS — BP 130/68 | HR 83 | Ht 75.0 in | Wt 214.2 lb

## 2020-01-29 DIAGNOSIS — E785 Hyperlipidemia, unspecified: Secondary | ICD-10-CM | POA: Diagnosis not present

## 2020-01-29 DIAGNOSIS — I4892 Unspecified atrial flutter: Secondary | ICD-10-CM | POA: Diagnosis not present

## 2020-01-29 DIAGNOSIS — I5022 Chronic systolic (congestive) heart failure: Secondary | ICD-10-CM | POA: Diagnosis not present

## 2020-01-29 LAB — BASIC METABOLIC PANEL
BUN/Creatinine Ratio: 15 (ref 10–24)
BUN: 15 mg/dL (ref 8–27)
CO2: 32 mmol/L — ABNORMAL HIGH (ref 20–29)
Calcium: 9.3 mg/dL (ref 8.6–10.2)
Chloride: 101 mmol/L (ref 96–106)
Creatinine, Ser: 0.99 mg/dL (ref 0.76–1.27)
GFR calc Af Amer: 91 mL/min/{1.73_m2} (ref >59)
GFR calc non Af Amer: 79 mL/min/{1.73_m2} (ref >59)
Glucose: 90 mg/dL (ref 65–99)
Potassium: 4 mmol/L (ref 3.5–5.2)
Sodium: 143 mmol/L (ref 134–144)

## 2020-01-29 LAB — CBC
Hematocrit: 37.5 % (ref 37.5–51.0)
Hemoglobin: 11.2 g/dL — ABNORMAL LOW (ref 13.0–17.7)
MCH: 24.3 pg — ABNORMAL LOW (ref 26.6–33.0)
MCHC: 29.9 g/dL — ABNORMAL LOW (ref 31.5–35.7)
MCV: 81 fL (ref 79–97)
Platelets: 314 10*3/uL (ref 150–450)
RBC: 4.61 x10E6/uL (ref 4.14–5.80)
RDW: 14.5 % (ref 11.6–15.4)
WBC: 7.5 10*3/uL (ref 3.4–10.8)

## 2020-01-29 LAB — LIPID PANEL
Chol/HDL Ratio: 2.9 ratio (ref 0.0–5.0)
Cholesterol, Total: 148 mg/dL (ref 100–199)
HDL: 51 mg/dL (ref >39)
LDL Chol Calc (NIH): 73 mg/dL (ref 0–99)
Triglycerides: 138 mg/dL (ref 0–149)
VLDL Cholesterol Cal: 24 mg/dL (ref 5–40)

## 2020-01-29 NOTE — Patient Instructions (Signed)
Medication Instructions:  No changes *If you need a refill on your cardiac medications before your next appointment, please call your pharmacy*   Lab Work: Today: bmet, cbc, lipids  If you have labs (blood work) drawn today and your tests are completely normal, you will receive your results only by: Marland Kitchen MyChart Message (if you have MyChart) OR . A paper copy in the mail If you have any lab test that is abnormal or we need to change your treatment, we will call you to review the results.   Testing/Procedures: none   Follow-Up: At [facility], you and your health needs are our priority.  As part of our continuing mission to provide you with exceptional heart care, we have created designated Provider Care Teams.  These Care Teams include your primary Cardiologist (physician) and Advanced Practice Providers (APPs -  Physician Assistants and Nurse Practitioners) who all work together to provide you with the care you need, when you need it.  We recommend signing up for the patient portal called "MyChart".  Sign up information is provided on this After Visit Summary.  MyChart is used to connect with patients for Virtual Visits (Telemedicine).  Patients are able to view lab/test results, encounter notes, upcoming appointments, etc.  Non-urgent messages can be sent to your provider as well.   To learn more about what you can do with MyChart, go to [email or web address].    Your next appointment:   10 month(s) June 2022  The format for your next appointment:   In Person  Provider:   You may see Dietrich Pates, MD or one of the following Advanced Practice Providers on your designated Care Team:    Tereso Newcomer, PA-C  Chelsea Aus, [place]    Other Instructions

## 2020-01-29 NOTE — Progress Notes (Signed)
Cardiology Office Note   Date:  01/29/2020   ID:  Jared [name], DOB Oct 05, 1955, MRN [id number]  PCP:  Charlott Rakes, MD  Cardiologist:   Dorris Carnes, MD   Patient presents for f/u of CHF and atrial flutter      History of Present Illness: Jared [name] is a 64 y.o. male with a history of  CAD (mild), chronic combined systolic and diastolic heart failure, history of CVA, hypertension, hyperlipidemia, paroxysmal atrial flutter on Xarelto, COPD on oxygen and tobacco abuse. EF 30-35% in 2018, up to 45-50% by echo in 04/2018.  Since seen the pt says his breathing is better  He is not on oxygen now like he was before.  Earlier this year he was on oxygen 24/7. The patient notes occasional heart fluttering  Spells last a couple of min  Not associated with dizziness  He notes rare CP   spells occur usually with activity  Last less than a minute   Still smoking a few cigs per day     Current Meds  Medication Sig  . fluticasone (FLONASE) 50 MCG/ACT nasal spray Place 1 spray into both nostrils daily for 30 days.     Allergies:   Lisinopril   Past Medical History:  Diagnosis Date  . CAD (coronary artery disease)    a. LHC 5/12:  LAD 20, pLCx 20, pRCA 40, dRCA 40, EF 35%, diff HK  //  b. Myoview 4/16: Overall Impression: High risk stress nuclear study There is no evidence of ischemia. There is severe LV dysfunction. LV Ejection Fraction: 30%. LV Wall Motion: There is global LV hypokinesis.   Marland Kitchen CAP (community acquired pneumonia) 09/2013  . Chronic combined systolic and diastolic CHF (congestive heart failure) (HCC)    a. Echo 4/16:Mild LVH, EF 40-45%, diffuse HK //  b. Echo 8/17: EF 35-40%, diffuse HK, diastolic dysfunction, aortic sclerosis, trivial MR, moderate LAE, normal RVSF, moderate RAE, mild TR, PASP 42 mmHg // c. Echo 4/18: Mild concentric LVH, EF 30-35, normal wall motion, grade 1 diastolic dysfunction, PASP 49  . Cluster headache    "hx; haven't had one in awhile" (01/09/2016)   . COPD (chronic obstructive pulmonary disease) ([place])    [name] 01/09/2016  . History of CVA (cerebrovascular accident)   . Hypertension   . NICM (nonischemic cardiomyopathy) ([place])   . Tobacco abuse     Past Surgical History:  Procedure Laterality Date  . CARDIAC CATHETERIZATION  10/2010   LM normal, LAD with 20% irregularities, LCX with 20%, RCA with 40% prox and 40% distal - EF of 35%  . CATARACT EXTRACTION, BILATERAL    . COLONOSCOPY W/ BIOPSIES AND POLYPECTOMY    . COLONOSCOPY WITH PROPOFOL N/A 09/06/2018   Procedure: COLONOSCOPY WITH PROPOFOL;  Surgeon: Thornton Park, MD;  Location: [facility];  Service: Gastroenterology;  Laterality: N/A;  . ENTEROSCOPY N/A 09/28/2018   Procedure: ENTEROSCOPY;  Surgeon: Carol Ada, MD;  Location: [facility];  Service: Endoscopy;  Laterality: N/A;  . ENTEROSCOPY N/A 10/28/2018   Procedure: ENTEROSCOPY;  Surgeon: Thornton Park, MD;  Location: [facility];  Service: Gastroenterology;  Laterality: N/A;  . ESOPHAGOGASTRODUODENOSCOPY (EGD) WITH PROPOFOL N/A 09/05/2018   Procedure: ESOPHAGOGASTRODUODENOSCOPY (EGD) WITH PROPOFOL;  Surgeon: Yetta Flock, MD;  Location: [facility];  Service: Gastroenterology;  Laterality: N/A;  . EXCISION MASS HEAD    . GIVENS CAPSULE STUDY N/A 09/06/2018   Procedure: GIVENS CAPSULE STUDY;  Surgeon: Thornton Park, MD;  Location: [facility];  Service: Gastroenterology;  Laterality: N/A;  . GIVENS CAPSULE STUDY N/A 09/26/2018   Procedure: GIVENS CAPSULE STUDY;  Surgeon: Jerene Bears, MD;  Location: [place] [facility];  Service: Gastroenterology;  Laterality: N/A;  . GIVENS CAPSULE STUDY N/A 06/14/2019   Procedure: GIVENS CAPSULE STUDY;  Surgeon: Mauri Pole, MD;  Location: [facility];  Service: Endoscopy;  Laterality: N/A;  . HOT HEMOSTASIS N/A 10/28/2018   Procedure: HOT HEMOSTASIS (ARGON PLASMA COAGULATION/BICAP);  Surgeon: Thornton Park, MD;  Location: [facility];  Service:  Gastroenterology;  Laterality: N/A;  . INCISION AND DRAINAGE PERIRECTAL ABSCESS N/A 06/05/2017   Procedure: IRRIGATION AND DEBRIDEMENT PERIRECTAL ABSCESS;  Surgeon: Ileana Roup, MD;  Location: [facility];  Service: General;  Laterality: N/A;  . VIDEO BRONCHOSCOPY Bilateral 05/08/2016   Procedure: VIDEO BRONCHOSCOPY WITH FLUORO;  Surgeon: Rigoberto Noel, MD;  Location: [facility];  Service: Cardiopulmonary;  Laterality: Bilateral;     Social History:  The patient  reports that he has been smoking cigarettes. He has a 94.00 pack-year smoking history. He has never used smokeless tobacco. He reports previous alcohol use. He reports that he does not use drugs.   Family History:  The patient's family history includes Cancer in his father; Colon cancer in his mother; Diabetes in his brother, mother, and sister; Heart disease in his mother; Liver cancer in his mother.    ROS:  Please see the history of present illness. All other systems are reviewed and  Negative to the above problem except as noted.    PHYSICAL EXAM: VS:  BP 130/68   Pulse 83   Ht _0  (1.905 m)   Wt 214 lb 3.2 oz (97.2 kg)   SpO2 92%   BMI 26.77 kg/m   GEN: Well nourished, well developed, in no acute distress  HEENT: normal  Neck: no JVD, carotid bruits Cardiac: RRR; no murmurs.  No LE edema  Respiratory: Decreased airflow bilaterally.  No wheezes GI: soft, nontender, nondistended, + BS  No hepatomegaly  MS: no deformity Moving all extremities   Skin: warm and dry, no rash Neuro:  Strength and sensation are intact Psych: euthymic mood, full affect   EKG:  EKG is not ordered today.   Lipid Panel    Component Value Date/Time   CHOL 206 (H) 08/26/2017 0756   TRIG 168 (H) 08/26/2017 0756   HDL 53 08/26/2017 0756   CHOLHDL 3.9 08/26/2017 0756   CHOLHDL 2.8 01/11/2016 0446   VLDL 14 01/11/2016 0446   LDLCALC 119 (H) 08/26/2017 0756      Wt Readings from Last 3 Encounters:  01/29/20 214 lb 3.2 oz (97.2  kg)  12/29/19 207 lb (93.9 kg)  09/17/19 200 lb 14.4 oz (91.1 kg)      ASSESSMENT AND PLAN:  1.  Chronic systolic CHF.  Echo in past LVEF was 30 to 35% 2018.  In 2019 was 45 to 50%.  Volume status is okay.  Keep on same regimen.  We will check be met today.  2.  Atrial flutter.  Continue Xarelto.  Occasional short spells of fluttering.  Will check CBC today  3 CAD.  No significant chest pain.  Follow  4 hypertension.  Adequate control.  5 COPD Gold class III.  Still smoking a few cigarettes per day.  Told him to try to limit more  6.  Dyslipidemia.  Will check lipids today.  7.  History of CVA.  Limit time off of anticoagulation.  He is due to have his teeth  pulled in front.  Would recomm that this be done iff possible without discontinuation of anticoag.  I will set to see the patient back in the summer.    Current medicines are reviewed at length with the patient today.  The patient does not have concerns regarding medicines.  Signed, Dorris Carnes, MD  01/29/2020 10:04 AM    [address], [place], [place]  [address] Phone: [phone]; Fax: [phone]

## 2020-02-01 ENCOUNTER — Other Ambulatory Visit: Payer: Self-pay | Admitting: *Deleted

## 2020-02-01 DIAGNOSIS — D509 Iron deficiency anemia, unspecified: Secondary | ICD-10-CM

## 2020-02-01 DIAGNOSIS — Z961 Presence of intraocular lens: Secondary | ICD-10-CM | POA: Diagnosis not present

## 2020-02-01 DIAGNOSIS — H43813 Vitreous degeneration, bilateral: Secondary | ICD-10-CM | POA: Diagnosis not present

## 2020-02-08 ENCOUNTER — Other Ambulatory Visit: Payer: Self-pay | Admitting: Family Medicine

## 2020-02-08 DIAGNOSIS — I5042 Chronic combined systolic (congestive) and diastolic (congestive) heart failure: Secondary | ICD-10-CM

## 2020-02-08 DIAGNOSIS — I110 Hypertensive heart disease with heart failure: Secondary | ICD-10-CM

## 2020-02-08 MED FILL — GABAPENTIN 300 MG CAPSULE: 300 | 30 days supply | Qty: 30 | Fill #3

## 2020-02-08 MED FILL — CARVEDILOL 3.125 MG TABLET: 3.125 | 30 days supply | Qty: 60 | Fill #0

## 2020-02-08 NOTE — Telephone Encounter (Signed)
Courtesy refill. 1 month overdue OV . Called patient and left message to call back for appt.

## 2020-02-15 ENCOUNTER — Other Ambulatory Visit: Payer: Self-pay | Admitting: Family Medicine

## 2020-02-15 DIAGNOSIS — I110 Hypertensive heart disease with heart failure: Secondary | ICD-10-CM

## 2020-02-16 NOTE — Telephone Encounter (Signed)
Requested medication (s) are due for refill today: no  Requested medication (s) are on the active medication list: yes   Last refill:  12/22/2019  Future visit scheduled:yes   Notes to clinic:  medication was filled by a historical provider    Requested Prescriptions  Pending Prescriptions Disp Refills   atorvastatin (LIPITOR) 80 MG tablet [Pharmacy Med Name: ATORVASTATIN CALCIUM 80 MG Tablet] 90 tablet     Sig: TAKE 1 TABLET EVERY DAY      Cardiovascular:  Antilipid - Statins Failed - 02/15/2020  4:19 PM      Failed - LDL in normal range and within 360 days    LDL Chol Calc (NIH)  Date Value Ref Range Status  01/29/2020 73 0 - 99 mg/dL Final          Passed - Total Cholesterol in normal range and within 360 days    Cholesterol, Total  Date Value Ref Range Status  01/29/2020 148 100 - 199 mg/dL Final          Passed - HDL in normal range and within 360 days    HDL  Date Value Ref Range Status  01/29/2020 51 >39 mg/dL Final          Passed - Triglycerides in normal range and within 360 days    Triglycerides  Date Value Ref Range Status  01/29/2020 138 0 - 149 mg/dL Final          Passed - Patient is not pregnant      Passed - Valid encounter within last 12 months    Recent Outpatient Visits           7 months ago Need for vaccination for zoster   [facility] [place], Jeannett Senior L, RPH-CPP   7 months ago Chronic respiratory failure with hypoxia (HCC)   [facility] [name], MD   9 months ago COPD GOLD III/still smoking    [facility] [place], [place], MD   1 year ago Hypertensive heart disease with chronic combined systolic and diastolic congestive heart failure (HCC)   [place] [facility] [name], MD   1 year ago Chronic respiratory failure with hypoxia [facility])   [facility] [name], MD        Future Appointments             In 1 month Wert, Charlaine Dalton, MD [place]   In 1 month [name], MD [facility]             Signed Prescriptions Disp Refills   furosemide (LASIX) 40 MG tablet 90 tablet 0    Sig: TAKE 1 TABLET EVERY DAY      Cardiovascular:  Diuretics - Loop Failed - 02/15/2020  4:19 PM      Failed - Valid encounter within last 6 months    Recent Outpatient Visits           7 months ago Need for vaccination for zoster   [facility] [place], Cornelius Moras, RPH-CPP   7 months ago Chronic respiratory failure with hypoxia [facility])   [place] [facility] [name], MD   9 months ago COPD GOLD III/still smoking    [facility] [place], Odette Horns, MD   1 year ago Hypertensive heart disease with  chronic combined systolic and diastolic congestive heart failure (HCC)   [place] [facility] [place], Odette Horns, MD   1 year ago Chronic respiratory failure with hypoxia [facility])   [place] [place], Odette Horns, MD       Future Appointments             In 1 month Wert, Charlaine Dalton, MD [facility] [facility]   In 1 month [name], MD [facility]            Passed - K in normal range and within 360 days    Potassium  Date Value Ref Range Status  01/29/2020 4.0 3.5 - 5.2 mmol/L Final          Passed - Ca in normal range and within 360 days    Calcium  Date Value Ref Range Status  01/29/2020 9.3 8.6 - 10.2 mg/dL Final   Calcium, Ion  Date Value Ref Range Status  09/04/2018 1.07 (L) 1.15 - 1.40 mmol/L Final          Passed - Na in normal range and within 360 days    Sodium  Date Value Ref Range Status  01/29/2020 143 134 - 144 mmol/L Final          Passed - Cr in normal range and within 360 days    Creat  Date Value Ref Range Status  07/30/2016  1.20 0.70 - 1.25 mg/dL Final    Comment:      For patients > or = 66 years of age: The upper reference limit for Creatinine is approximately 13% higher for people identified as African-American.      Creatinine, Ser  Date Value Ref Range Status  01/29/2020 0.99 0.76 - 1.27 mg/dL Final          Passed - Last BP in normal range    BP Readings from Last 1 Encounters:  01/29/20 130/68            XARELTO 20 MG TABS tablet 90 tablet 0    Sig: TAKE 1 TABLET EVERY DAY  WITH  SUPPER      Hematology: Anticoagulants - rivaroxaban Failed - 02/15/2020  4:19 PM      Failed - ALT in normal range and within 180 days    ALT  Date Value Ref Range Status  06/15/2019 15 0 - 44 U/L Final          Failed - AST in normal range and within 180 days    AST  Date Value Ref Range Status  06/15/2019 18 15 - 41 U/L Final          Failed - HGB in normal range and within 360 days    Hemoglobin  Date Value Ref Range Status  01/29/2020 11.2 (L) 13.0 - 17.7 g/dL Final          Passed - Cr in normal range and within 360 days    Creat  Date Value Ref Range Status  07/30/2016 1.20 0.70 - 1.25 mg/dL Final    Comment:      For patients > or = 66 years of age: The upper reference limit for Creatinine is approximately 13% higher for people identified as African-American.      Creatinine, Ser  Date Value Ref Range Status  01/29/2020 0.99 0.76 - 1.27 mg/dL Final          Passed - HCT in normal range and  within 360 days    Hematocrit  Date Value Ref Range Status  01/29/2020 37.5 37.5 - 51.0 % Final          Passed - PLT in normal range and within 360 days    Platelets  Date Value Ref Range Status  01/29/2020 314 150 - 450 x10E3/uL Final          Passed - Valid encounter within last 12 months    Recent Outpatient Visits           7 months ago Need for vaccination for zoster   [facility] [place], Cornelius Moras, RPH-CPP   7 months ago Chronic  respiratory failure with hypoxia [facility])   [place] [facility] [name], MD   9 months ago COPD GOLD III/still smoking    [facility] [place], Odette Horns, MD   1 year ago Hypertensive heart disease with chronic combined systolic and diastolic congestive heart failure (HCC)   [place] [facility] [name], MD   1 year ago Chronic respiratory failure with hypoxia [facility])   [place] [facility] [name], MD       Future Appointments             In 1 month Wert, Charlaine Dalton, MD [place] [facility]   In 1 month [name], MD [facility]

## 2020-02-24 ENCOUNTER — Other Ambulatory Visit: Payer: Self-pay | Admitting: Family Medicine

## 2020-02-24 MED FILL — DULoxetine HCL 60 MG CPEP: 60 | 30 days supply | Qty: 30 | Fill #0

## 2020-02-24 NOTE — Telephone Encounter (Signed)
Appointment 04/04/20

## 2020-03-01 MED FILL — TUDORZA PRESSAIR 400 MCG/AC: 400 | 30 days supply | Qty: 1 | Fill #3

## 2020-03-03 DIAGNOSIS — R69 Illness, unspecified: Secondary | ICD-10-CM | POA: Diagnosis not present

## 2020-03-17 ENCOUNTER — Other Ambulatory Visit: Payer: Self-pay | Admitting: Family Medicine

## 2020-03-17 ENCOUNTER — Other Ambulatory Visit: Payer: Self-pay

## 2020-03-17 ENCOUNTER — Ambulatory Visit: Payer: Medicare HMO | Attending: Internal Medicine

## 2020-03-17 DIAGNOSIS — Z23 Encounter for immunization: Secondary | ICD-10-CM

## 2020-03-17 DIAGNOSIS — I5042 Chronic combined systolic (congestive) and diastolic (congestive) heart failure: Secondary | ICD-10-CM

## 2020-03-17 DIAGNOSIS — I110 Hypertensive heart disease with heart failure: Secondary | ICD-10-CM

## 2020-03-17 MED FILL — CARVEDILOL 3.125 MG TABLET: 3.125 | 30 days supply | Qty: 60 | Fill #0

## 2020-03-17 NOTE — Progress Notes (Signed)
° °  Covid-19 Vaccination Clinic  Name:  Jared [name]    MRN: [id number] DOB: Apr 18, 1956  03/17/2020  [name] was observed post Covid-19 immunization for 15 minutes without incident. He was provided with Vaccine Information Sheet and instruction to access the V-Safe system.   [name] was instructed to call 911 with any severe reactions post vaccine:  Difficulty breathing   Swelling of face and throat   A fast heartbeat   A bad rash all over body   Dizziness and weakness

## 2020-03-17 NOTE — Telephone Encounter (Signed)
Requested Prescriptions  Pending Prescriptions Disp Refills   carvedilol (COREG) 3.125 MG tablet [Pharmacy Med Name: CARVEDILOL 3.125 MG TABLET 3.125 Tablet] 60 tablet 0    Sig: TAKE 1 TABLET (3.125 MG TOTAL) BY MOUTH 2 (TWO) TIMES DAILY.     Cardiovascular:  Beta Blockers Failed - 03/17/2020  9:09 AM      Failed - Valid encounter within last 6 months    Recent Outpatient Visits          8 months ago Need for vaccination for zoster   [facility] [place], Cornelius Moras, RPH-CPP   8 months ago Chronic respiratory failure with hypoxia [facility])   [facility] [facility] [name], MD   10 months ago COPD GOLD III/still smoking    [place] [place], Odette Horns, MD   1 year ago Hypertensive heart disease with chronic combined systolic and diastolic congestive heart failure (HCC)   [place] [facility] [name], MD   1 year ago Chronic respiratory failure with hypoxia [facility])   [place] [facility] [name], MD      Future Appointments            In 2 weeks [name], Charlaine Dalton, MD [place]   In 2 weeks [name], MD [facility]           Passed - Last BP in normal range    BP Readings from Last 1 Encounters:  01/29/20 130/68         Passed - Last Heart Rate in normal range    Pulse Readings from Last 1 Encounters:  01/29/20 83

## 2020-03-21 ENCOUNTER — Other Ambulatory Visit: Payer: Self-pay | Admitting: Family Medicine

## 2020-03-21 DIAGNOSIS — G629 Polyneuropathy, unspecified: Secondary | ICD-10-CM

## 2020-03-21 NOTE — Telephone Encounter (Signed)
Requested medication (s) are due for refill today: yes  Requested medication (s) are on the active medication list: yes  Last refill: 02/08/20  Future visit scheduled: yes  Notes to clinic:  please verify dose    Requested Prescriptions  Pending Prescriptions Disp Refills   gabapentin (NEURONTIN) 300 MG capsule [Pharmacy Med Name: GABAPENTIN 300 MG CAPSULE 300 Capsule] 30 capsule 6    Sig: TAKE 1 CAPSULE (300 MG TOTAL) BY MOUTH AT BEDTIME.      Neurology: Anticonvulsants - gabapentin Passed - 03/21/2020  9:11 AM      Passed - Valid encounter within last 12 months    Recent Outpatient Visits           8 months ago Need for vaccination for zoster   [facility] [place], Cornelius Moras, RPH-CPP   9 months ago Chronic respiratory failure with hypoxia [facility])   [place] [facility] [name], MD   10 months ago COPD GOLD III/still smoking    [place] [facility] [place], Odette Horns, MD   1 year ago Hypertensive heart disease with chronic combined systolic and diastolic congestive heart failure [facility])   [place] [facility] [name], MD   1 year ago Chronic respiratory failure with hypoxia [facility])   [place] [facility] [name], MD       Future Appointments             In 2 weeks Wert, Charlaine Dalton, MD [place]   In 2 weeks [name], MD [facility]

## 2020-03-23 ENCOUNTER — Other Ambulatory Visit: Payer: Self-pay | Admitting: Family Medicine

## 2020-03-23 ENCOUNTER — Ambulatory Visit: Payer: Medicare HMO | Admitting: Family Medicine

## 2020-03-23 MED FILL — GABAPENTIN 300 MG CAPSULE: 300 | 30 days supply | Qty: 30 | Fill #0

## 2020-03-24 ENCOUNTER — Ambulatory Visit (HOSPITAL_BASED_OUTPATIENT_CLINIC_OR_DEPARTMENT_OTHER): Payer: Medicare HMO | Admitting: Pharmacist

## 2020-03-24 ENCOUNTER — Other Ambulatory Visit: Payer: Self-pay

## 2020-03-24 ENCOUNTER — Ambulatory Visit: Payer: Medicare HMO | Attending: Family Medicine | Admitting: Physician Assistant

## 2020-03-24 DIAGNOSIS — Z23 Encounter for immunization: Secondary | ICD-10-CM

## 2020-03-24 NOTE — Progress Notes (Signed)
Patient presents for vaccination against influenza per orders of Dr. Newlin. Consent given. Counseling provided. No contraindications exists. Vaccine administered without incident.   Luke Van Ausdall, PharmD, CPP Clinical Pharmacist [facility] [phone]  

## 2020-03-29 ENCOUNTER — Other Ambulatory Visit: Payer: Self-pay | Admitting: Internal Medicine

## 2020-03-29 MED FILL — TUDORZA PRESSAIR 400 MCG/AC: 400 | 30 days supply | Qty: 1 | Fill #0

## 2020-03-29 MED FILL — DULoxetine HCL 60 MG CPEP: 60 | 30 days supply | Qty: 30 | Fill #1

## 2020-03-30 ENCOUNTER — Other Ambulatory Visit: Payer: Medicare HMO | Admitting: *Deleted

## 2020-03-30 ENCOUNTER — Other Ambulatory Visit: Payer: Self-pay

## 2020-03-30 DIAGNOSIS — D509 Iron deficiency anemia, unspecified: Secondary | ICD-10-CM | POA: Diagnosis not present

## 2020-03-30 LAB — CBC
Hematocrit: 32.6 % — ABNORMAL LOW (ref 37.5–51.0)
Hemoglobin: 8.7 g/dL — ABNORMAL LOW (ref 13.0–17.7)
MCH: 20.2 pg — ABNORMAL LOW (ref 26.6–33.0)
MCHC: 26.7 g/dL — ABNORMAL LOW (ref 31.5–35.7)
MCV: 76 fL — ABNORMAL LOW (ref 79–97)
NRBC: 1 % — ABNORMAL HIGH (ref 0–0)
Platelets: 432 10*3/uL (ref 150–450)
RBC: 4.31 x10E6/uL (ref 4.14–5.80)
RDW: 18.3 % — ABNORMAL HIGH (ref 11.6–15.4)
WBC: 7.7 10*3/uL (ref 3.4–10.8)

## 2020-03-31 ENCOUNTER — Telehealth: Payer: Self-pay | Admitting: *Deleted

## 2020-03-31 DIAGNOSIS — D509 Iron deficiency anemia, unspecified: Secondary | ICD-10-CM

## 2020-03-31 MED ORDER — FERROUS SULFATE 324 MG PO TBEC: 324.0000 mg | DELAYED_RELEASE_TABLET | Freq: Every day | ORAL | Status: DC

## 2020-03-31 NOTE — Telephone Encounter (Signed)
-----   Message from Dietrich Pates V, MD sent at 03/30/2020 10:44 PM EDT ----- Hgb is back down again    Looks like Fe deficient   Would check anemia panel Should probably be on Fe supplemnts 324 daily   Recheck CBC in 1 month

## 2020-03-31 NOTE — Telephone Encounter (Signed)
Patient will begin daily iron supplement - one tablet. Will return for anemia panel next Wed 10/27 and CBC again on 11/30. Appointments have been scheduled and med list updated.

## 2020-04-01 IMAGING — CT CT MAXILLOFACIAL WITH CONTRAST
3 series · 15 of 47 positions shown, 18 images · IV contrast (omnipaque)
Comparison: None.

CLINICAL DATA: Left parotid gland enlargement.

EXAM:
CT MAXILLOFACIAL WITH CONTRAST
TECHNIQUE: Multidetector CT imaging of the maxillofacial structures was
performed with intravenous contrast. Multiplanar CT image
reconstructions were also generated.
CONTRAST:  75mL OMNIPAQUE IOHEXOL 300 MG/ML  SOLN

[Series 3: max soft · axial · 0.39mm/px · z∈[+1480,+1642]mm · 9 of 95 slices shown, 12 images]
[im 7/95  brain]
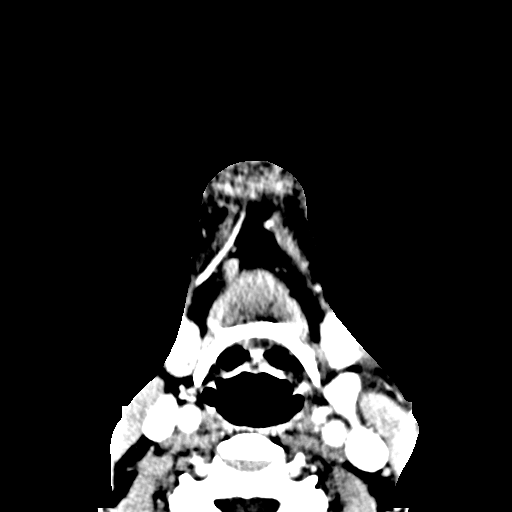
[im 7/95  bone]
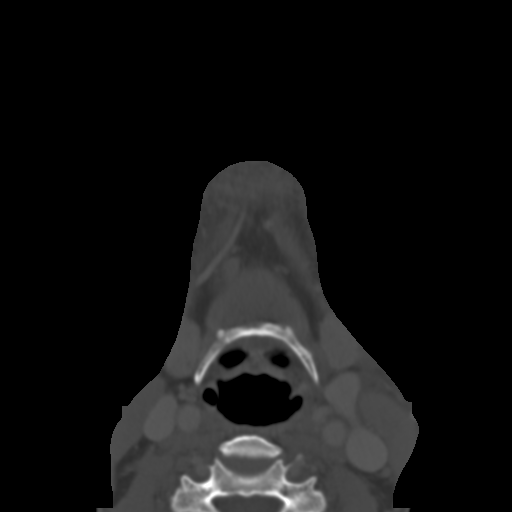
[im 17/95  bone]
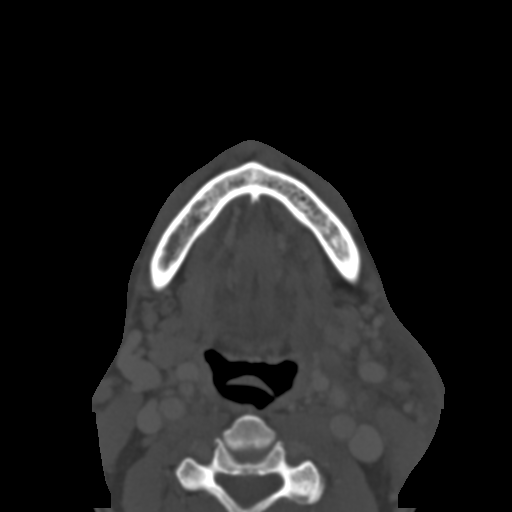
[im 26/95  bone]
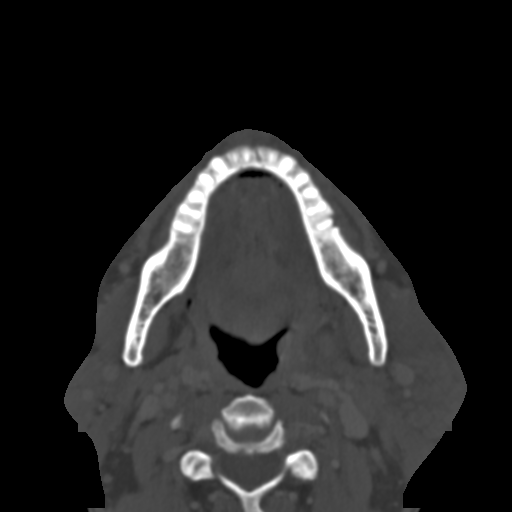
[im 36/95  bone]
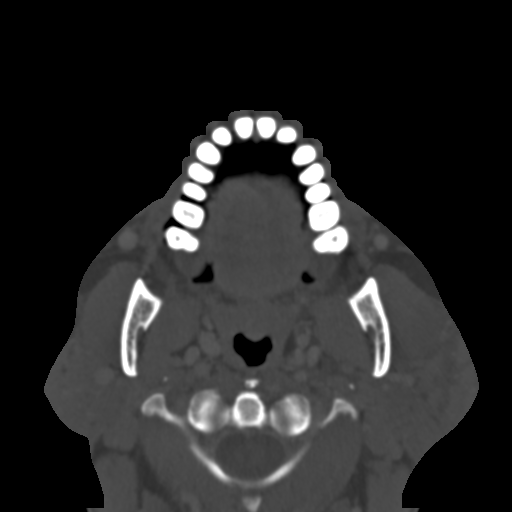
[im 49/95  brain]
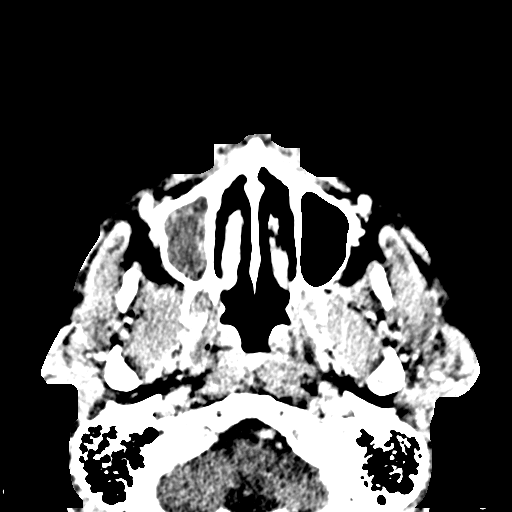
[im 49/95  bone]
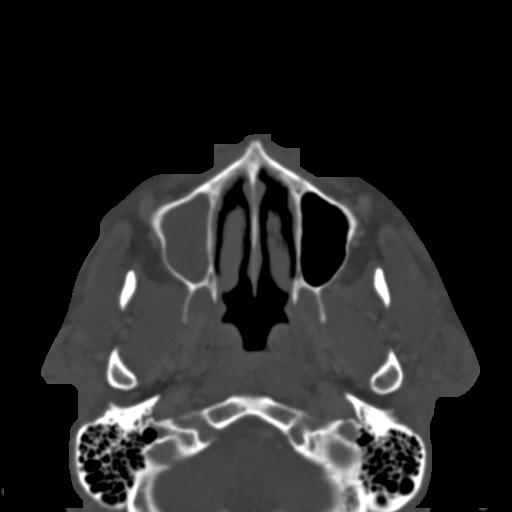
[im 59/95  bone]
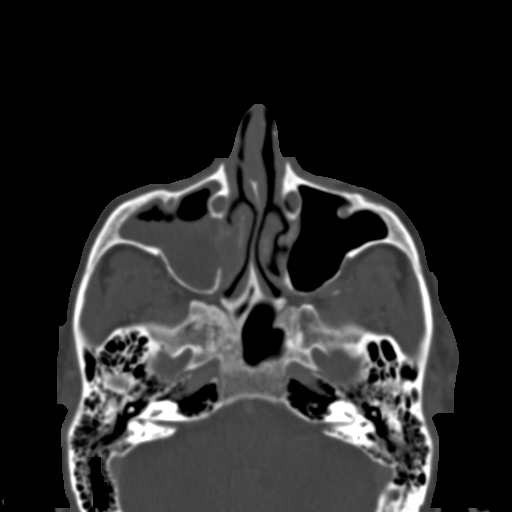
[im 69/95  bone]
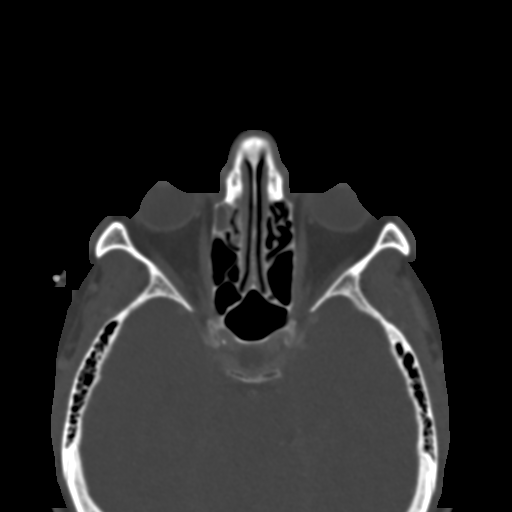
[im 78/95  bone]
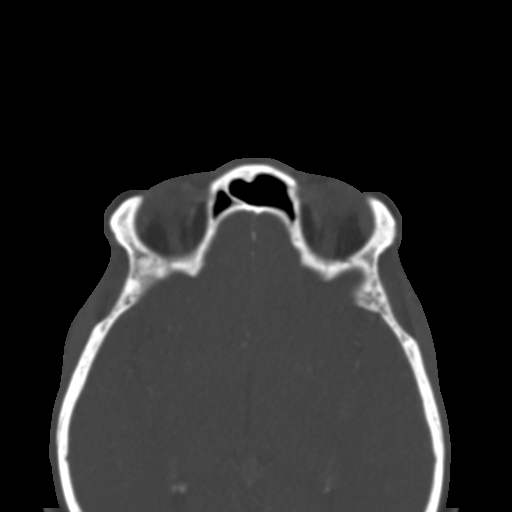
[im 88/95  brain]
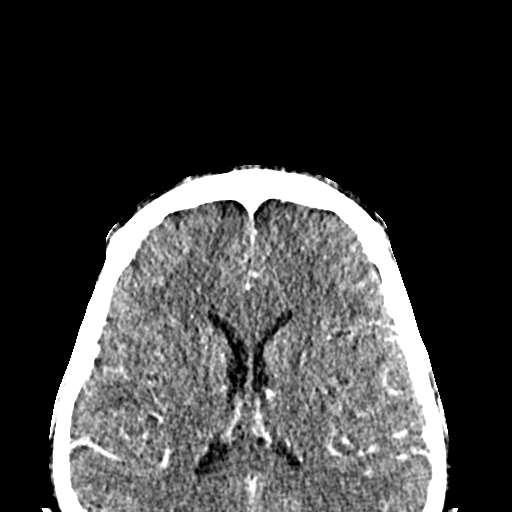
[im 88/95  bone]
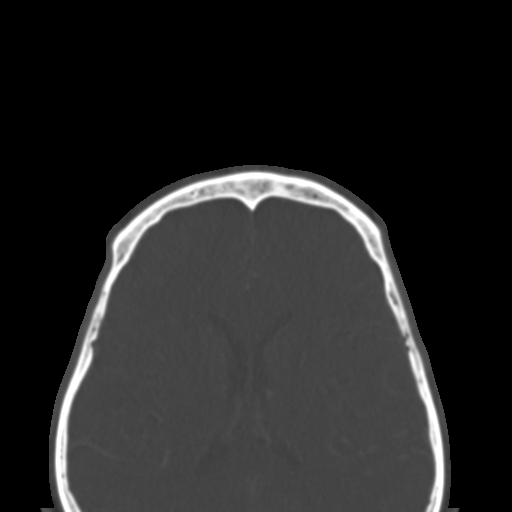

[Series 5: coronal soft · coronal · 0.43mm/px · 3 of 83 slices shown]
[im 28/83  bone]
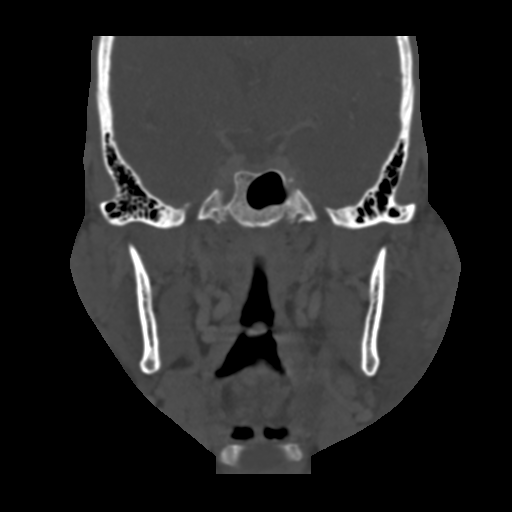
[im 37/83  bone]
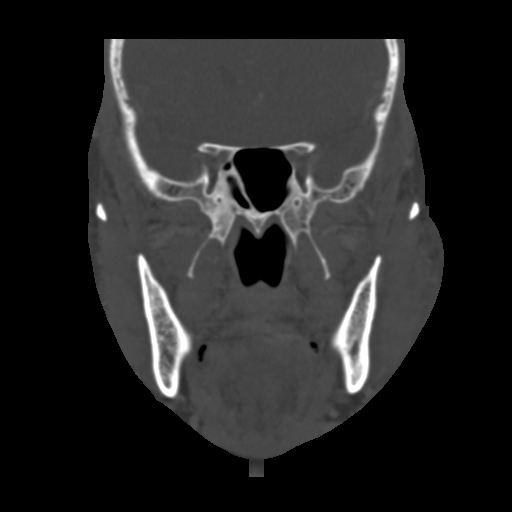
[im 46/83  bone]
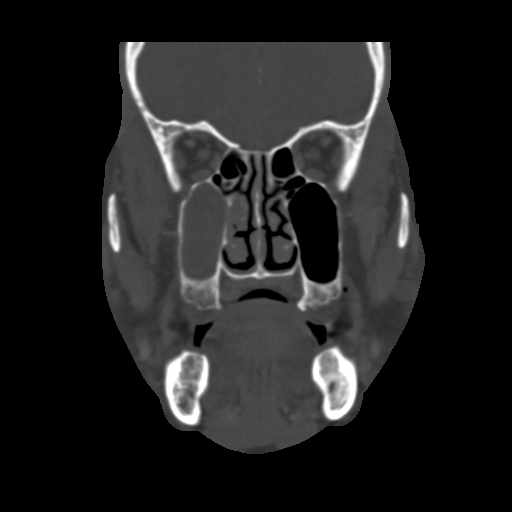

[Series 6: sagittal soft · sagittal · 0.37mm/px · 3 of 95 slices shown]
[im 32/95  bone]
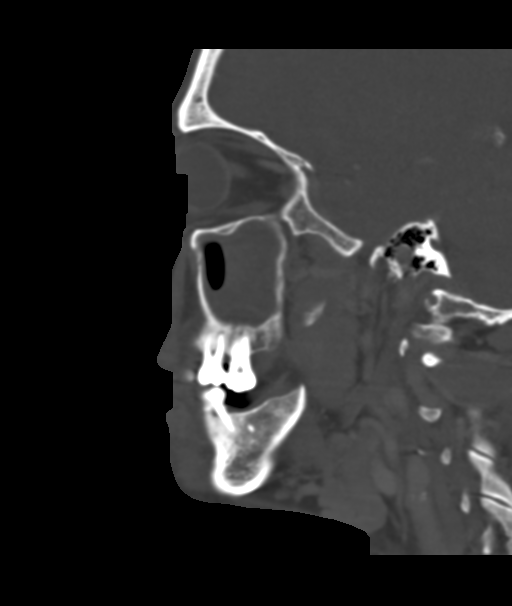
[im 48/95  bone]
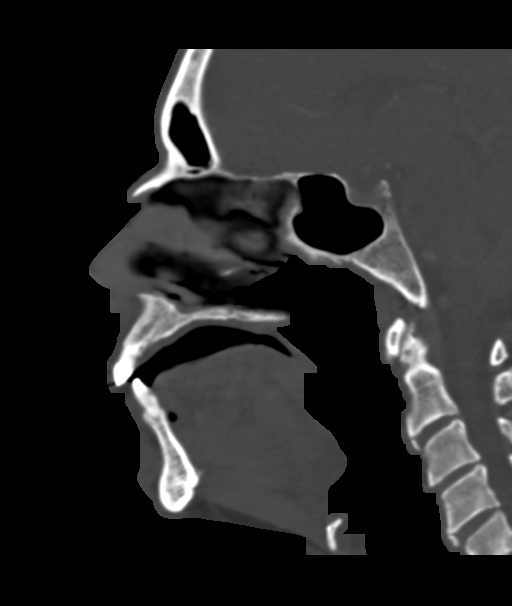
[im 63/95  bone]
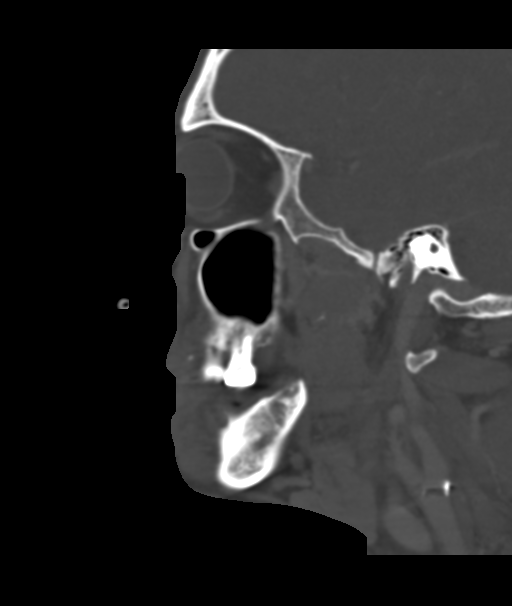

[15 of 47 positions shown; findings below may reference images not displayed]

FINDINGS: Osseous: No facial fracture.

Dental: There is a periapical lucency at the root of tooth 3, at the
floor of the maxillary sinus.

Orbits: The globes are intact. Normal appearance of the intra- and
extraconal fat. Symmetric extraocular muscles.

Sinuses: Partial opacification of right maxillary sinus with fluid
level.

Soft tissues: The left parotid gland is enlarged and edematous with
inflammatory induration of the overlying subcutaneous tissues.
Halimah Amin is dilated, measuring 8 mm. No sialolith is
identified. However, there is an abrupt caliber change just lateral
to the facial vein and posterior to the zygomaticus major muscle
([DATE], [DATE], [DATE]). There is an intermediate attenuation focus at
this level, but below the expected density of a stone.

Limited intracranial: Normal.
IMPRESSION: 1. Sialoadenitis of the left parotid gland with induration of the
overlying subcutaneous tissues. The parotid duct is dilated with a
transition point at the distal third of the duct, just posterior to
the zygomaticus major muscle and lateral to the facial vein.
2. No sialolithiasis or definite source of obstruction. There is an
intermediate attenuation focus at the level of the caliber change,
which may simply be an adjacent vessel, but is indeterminate.
3. Odontogenic right maxillary sinusitis with fluid level. Suspected
source is the periapical lucency at the root of tooth 3.

## 2020-04-01 IMAGING — DX PORTABLE CHEST - 1 VIEW
1 series · 1 of 1 positions shown · non-contrast
Comparison: 10/27/2018

CLINICAL DATA: Weakness, shortness of breath

EXAM:
PORTABLE CHEST 1 VIEW

[chest ap]
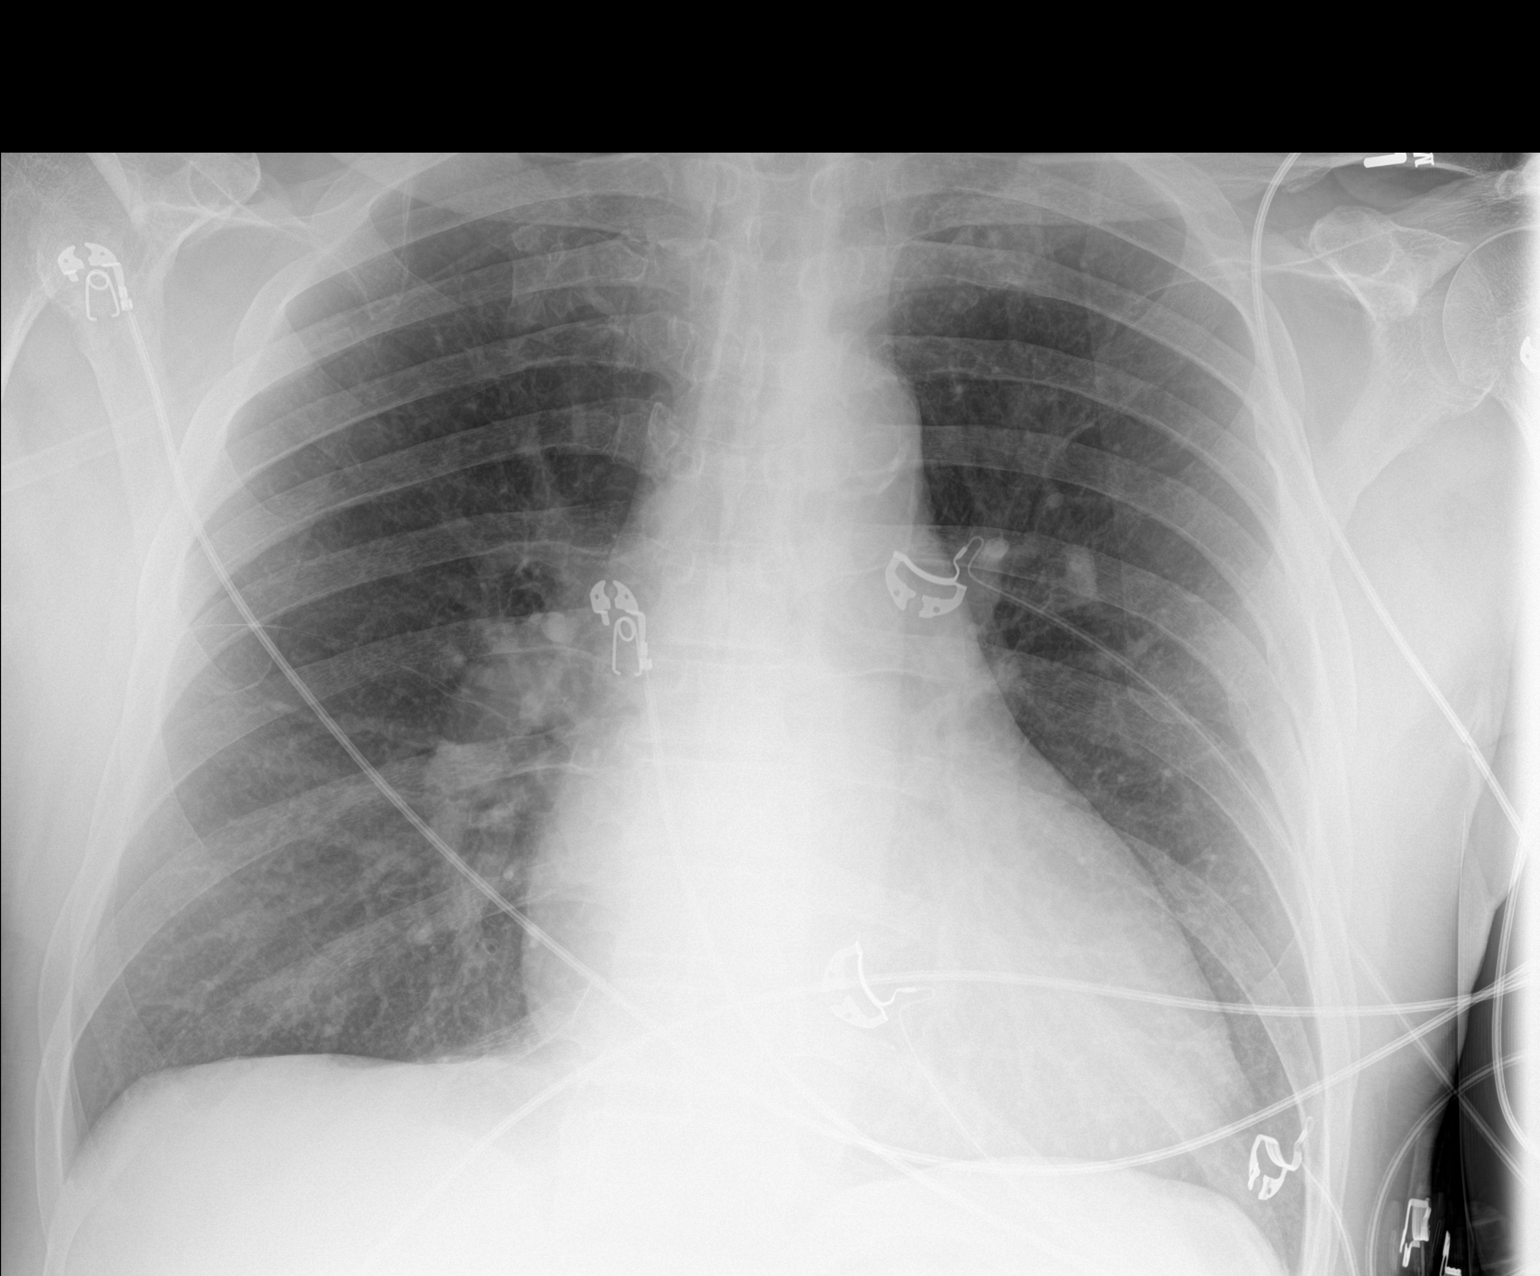

[1 of 1 positions shown; findings below may reference images not displayed]

FINDINGS: Prominent right infrahilar markings, reflecting normal pulmonary
vasculature. No focal consolidation. No pleural effusion or
pneumothorax.

The heart is top-normal in size.
IMPRESSION: No evidence of acute cardiopulmonary disease.

## 2020-04-04 ENCOUNTER — Ambulatory Visit (INDEPENDENT_AMBULATORY_CARE_PROVIDER_SITE_OTHER): Payer: Medicare HMO | Admitting: Internal Medicine

## 2020-04-04 ENCOUNTER — Ambulatory Visit: Payer: Medicare HMO | Admitting: Family Medicine

## 2020-04-04 ENCOUNTER — Other Ambulatory Visit: Payer: Self-pay

## 2020-04-04 ENCOUNTER — Encounter: Payer: Self-pay | Admitting: Internal Medicine

## 2020-04-04 DIAGNOSIS — J9611 Chronic respiratory failure with hypoxia: Secondary | ICD-10-CM

## 2020-04-04 DIAGNOSIS — J9612 Chronic respiratory failure with hypercapnia: Secondary | ICD-10-CM | POA: Diagnosis not present

## 2020-04-04 DIAGNOSIS — J449 Chronic obstructive pulmonary disease, unspecified: Secondary | ICD-10-CM

## 2020-04-04 LAB — PULMONARY FUNCTION TEST
DL/VA % pred: 34 %
DL/VA: 1.38 ml/min/mmHg/L
DLCO cor % pred: 27 %
DLCO cor: 8.26 ml/min/mmHg
DLCO unc % pred: 27 %
DLCO unc: 8.26 ml/min/mmHg
FEF 25-75 Post: 0.56 L/sec
FEF 25-75 Pre: 0.49 L/sec
FEF2575-%Change-Post: 15 %
FEF2575-%Pred-Post: 18 %
FEF2575-%Pred-Pre: 15 %
FEV1-%Change-Post: 6 %
FEV1-%Pred-Post: 35 %
FEV1-%Pred-Pre: 33 %
FEV1-Post: 1.25 L
FEV1-Pre: 1.17 L
FEV1FVC-%Change-Post: -2 %
FEV1FVC-%Pred-Pre: 54 %
FEV6-%Change-Post: 6 %
FEV6-%Pred-Post: 63 %
FEV6-%Pred-Pre: 60 %
FEV6-Post: 2.83 L
FEV6-Pre: 2.67 L
FEV6FVC-%Change-Post: -3 %
FEV6FVC-%Pred-Post: 97 %
FEV6FVC-%Pred-Pre: 100 %
FVC-%Change-Post: 9 %
FVC-%Pred-Post: 65 %
FVC-%Pred-Pre: 60 %
FVC-Post: 3.02 L
FVC-Pre: 2.77 L
Post FEV1/FVC ratio: 41 %
Post FEV6/FVC ratio: 94 %
Pre FEV1/FVC ratio: 42 %
Pre FEV6/FVC Ratio: 97 %
RV % pred: 195 %
RV: 5.1 L
TLC % pred: 107 %
TLC: 8.51 L

## 2020-04-04 MED ORDER — BREZTRI AEROSPHERE 160-9-4.8 MCG/ACT IN AERO: 2.0000 | INHALATION_SPRAY | Freq: Two times a day (BID) | RESPIRATORY_TRACT | 0 refills | Status: DC

## 2020-04-04 MED ORDER — BREZTRI AEROSPHERE 160-9-4.8 MCG/ACT IN AERO: 2.0000 | INHALATION_SPRAY | Freq: Two times a day (BID) | RESPIRATORY_TRACT | 11 refills | Status: DC

## 2020-04-04 NOTE — Assessment & Plan Note (Signed)
Active smoker 02/29/2016  After extensive coaching HFA effectiveness =    75% > continue symbicort  - Spirometry 05/28/2016  FEV1 1.48 (40%)  Ratio 46  p am symbicort 160 2bid  - 06/26/2016  After extensive coaching HFA effectiveness =    90% > bevespi 2 bid and change coreg to bisoprolol 5 mg daily > not done as of 07/24/2016 > at ov 10/22/2016 off coreg and all inhalers and much better sob but still some cough on entresto maint - Spirometry 10/22/2016  FEV1 1.24 (35%)  Ratio 48 - 12/31/2018    tudorza rx one puff bid  - 01/28/2019  After extensive coaching inhaler device,  effectiveness =    90% with tudorza  03/09/2019 Reports improvement with [name] but not covered, refer to [facility]. May need PA processed- he has been on bevespi and spiriva in the past  - 06/30/2019  After extensive coaching inhaler device,  effectiveness =    90% with elipta try anoro one each am  PFT's  04/04/2020  FEV1 1.25 (35 % ) ratio 0.41  p 6 % improvement from saba p tudorza prior to study with DLCO  8.26 (27%) corrects to 1.38 (34%)  for alv volume and FV curve classic concave pattern  - 04/04/2020  After extensive coaching inhaler device,  effectiveness = 90%       Group D in terms of symptom/risk and laba/lama/ICS  therefore appropriate rx at this point >>>  Try breztri and stop tudorza   Re saba use: I spent extra time with pt today reviewing appropriate use of albuterol for prn use on exertion with the following points: 1) saba is for relief of sob that does not improve by walking a slower pace or resting but rather if the pt does not improve after trying this first. 2) If the pt is convinced, as many are, that saba helps recover from activity faster then it's easy to tell if this is the case by re-challenging : ie stop, take the inhaler, then p 5 minutes try the exact same activity (intensity of workload) that just caused the symptoms and see if they are substantially diminished or not after saba 3) if there is an activity  that reproducibly causes the symptoms, try the saba 15 min before the activity on alternate days   If in fact the saba really does help, then fine to continue to use it prn but advised may need to look closer at the maintenance regimen being used to achieve better control of airways disease with exertion.

## 2020-04-04 NOTE — Progress Notes (Signed)
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 He also reports dyspnea on exertion. In addition, he reports epigastric abdominal pain, which may radiate to the lower portion of his sternum versus chest pain . He denies any lower extremity edema. Admits to salt rich food ingestion .The symptoms are similar to those of one week ago, at which time, workup was suspicious for acute exacerbation of systolic heart failure. She was started on Cozaar, metoprolol and Lasix 20 mg daily at the time, and was recommended to see cardiology, which he failed to do so. His cardiologist is Dr. Dietrich Pates, last seen in December 2016.the patient continues to smoke 1 pack a day of cigarettes  Discharge Diagnoses:  Acute respiratory failure secondary to CHF exacerbation -O2 sats upon admission 89% -CXR unremarkable for infection -Responded well to IVlasix-->po lasix -Weaned off of O2-SpO2 95% on room air  Acute combined systolic/diastolic heart failure -BNP 2056.8 -Echocardiogram 09/20/2014 showed EF 40-45% -Transition to by mouth Lasix 20 mg daily -Echocardiogram 01/10/2016: EF 35-40%, diastolic dysfunction --AST 147, ALT 110 upon admission -Currently trending downward -hepatitis panel unremarkable -Patient denies drug/alcohol  use -switch metoprolol-->coreg -continue losartan  Essential Hypertension -Has not been taking home medications for over a year due to lack of insurance and monetary funds -Restarted on losartan, Lasix --switch metoprolol-->coreg 6.25 mg bid  CAD -Currently no chest pain -Continue coreg, losartan, ASA 81 mg daily -LDL 80  Tobacco Abuse -Smoking cessation discussed -Continue nicotine patch  Moderate malnutrition -Nutrition consult   History of Present Illness  02/29/2016 1st [facility] office visit/ Jamaar Howes  symb 160 2bid/ bid saba (not prn)  Chief Complaint  Patient presents with  . Pulmonary Consult    Pt referred by Tereso Newcomer, PA, for cough, hemoptysis, and emphysema. Pt c/o hemoptysis x 3 weeks, pt states this occurs daily but has been improving. Pt c/o increase in SOB with activity and rest.   acute  onset epistaxis/ hemoptyis end of Auguest > ER eval 02/20/16 dx RLL pna by CTa chest > rx levaquin/ prednisone and minimal hemoptysis now  Doe = MMRC2 = can't walk a nl pace on a flat grade s sob but does fine slow and flat eg [employer] shopping rec Plan A = Automatic =  Symbicort 160 Take 2 puffs first thing in am and then another 2 puffs about 12 hours later.  Work on inhaler technique: Plan B = Backup Only use your albuterol as a rescue medication\ Plan C = Crisis - only use your albuterol nebulizer if you first try Plan B and it fails to help > ok to use the nebulizer up to every 4 hours but if start needing it regularly call for immediate appointment  The key is to stop smoking completely before smoking completely stops you!  Please schedule a follow up office visit  in 2 weeks, sooner if needed with cxr on return > did not return   Admit date: 05/04/2016 Discharge date: 05/09/2016    DISCHARGE DIAGNOSES:  Principal Problem:   Hemoptysis Active Problems:   Community acquired pneumonia of right lower lobe of lung (HCC) - - FOB RLL tbbx ATYPICAL PNEUMOCYTES  PROLIFERATION/ LLL BAL 05/08/16    Hypertension   COPD with acute exacerbation (HCC)   Nonischemic dilated cardiomyopathy (HCC)   Cigarette smoker   Chronic systolic CHF (congestive heart failure) (HCC)   06/03/16 to ER> only better while on neb    06/26/2016  f/u ov/Fatma Rutten re:  GOLD III copd / maint symb 160 / saba  Chief Complaint  Patient presents with  . Follow-up    pt had CT today. pt states breathing has worsen since last OV. pt c/o increased sob with exertion, prod cough with white mucus, wheezing & chest tightness,  breathing was worse w/in 2 days of last ov / then ran out of lasix one week ago and swelling started to get worse Also worse orthopnea ? Related to leg swelling in terms of timing but pt not sure  Doe now = MMRC3 = can't walk 100 yards even at a slow pace at a flat grade s stopping due to sob  rec Bisoprolol 5 mg one daily in place of the twice daily corevidol and resume your lasix and potassium  Plan A = Automatic = stop symbicort and start bevespi Take 2 puffs first thing in am and then another 2 puffs about 12 hours later.  Plan B = Backup Only use your albuterol as a rescue medication The key is to stop smoking completely before smoking completely stops you!  Please schedule a follow up office visit in 4 weeks, sooner if needed bring all meds      12/31/2018  f/u ov/Cydnie Deason re: re establish re copd now BREO ? Some bladder problems on LAMA?  Chief Complaint  Patient presents with  . Follow-up    Breathing seems slightly worse since the last visit. He is using his ventolin inhaler once per day and nebs about 2 x per day.   Dyspnea:  Worse for at least a year no better with 02/ some discomfort off 02 " like tightness "  Currently MMRC3 = can't walk 100 yards even at a slow pace at a flat grade s stopping due to sob even on 02 up to 4lpm  Cough: none  Sleeping: flat ok / no am cough /  SABA use: as above  Better p nebs/ over using  02: 3lpm 24/7  rec We need  you need you to monitor your 02 level to be sure it's above 90% Be sure protonix 40 mg Take 30-60 min before first meal of the day  We will ask [facility] to do BEST FIT analysis for longterm 02 The key is to stop smoking completely before smoking completely stops you! Add tudorza one puff twice daily  Only use your albuterol as a rescue medication  Only use nebulizer if you try the inhaler first  = change duoneb to pure albuterol 2.5 mg up to  every 4 hours is the max  Prednisone 10 mg take  4 each am x 2 days,   2 each am x 2 days,  1 each am x 2 days and stop  Please schedule a follow up office visit in 4 weeks, sooner if needed  with all medications /inhalers/ solutions in hand so  we can verify exactly what you are taking. This includes all medications from all doctors and over the counters Add: f/u sinus dz by CT    01/28/2019  f/u ov/Bobie Kistler re:  GOLD III/ still smoking  Chief Complaint  Patient presents with  . Follow-up    Patient reports that his breathing is the same since last visit. He reports using his rescue inhaler/nebulizer daily.  Dyspnea:  MMRC3 = can't walk 100 yards even at a slow pace at a flat grade s stopping due to sob  Even on 3lpm but does not check sats Cough: some in am min mucoid  Sleeping: able to lie flat one pillow  SABA use: both albuterol neb  02: 2lpm sleep / increases to 3lpm with activity inside house/ 2lpm at rest and portable at 2lpm    06/30/2019  f/u ov/Maximiliano Cromartie re: GOLD III/ 02 dep but till smoking maint on tudorza one bid Chief Complaint  Patient presents with  . Follow-up    Breathing has improved some and he is using his albuterol inhaler 2 x per wk and neb about 3 x per wk.   Dyspnea: doe x room to room  Cough: none  Sleeping: lies flat / one pillow  SABA use: as above  02: 2lpm up to 3lpm  rec Plan A = Automatic = Always=    Anoro one click first thing in am, take two good drags  Plan B = Backup (to supplement plan A, not to replace it) Only  use your albuterol inhaler as a rescue medication  Plan C = Crisis (instead of Plan B but only if Plan B stops working) - only use your albuterol nebulizer if you first try Plan B and it fails to help > ok to use the nebulizer up to every 4 hours but if start needing it regularly call for immediate appointment The key is to stop smoking completely before smoking completely stops you!   04/04/2020  f/u ov/Roylee Chaffin re:  GOLD III copd / 02 dep with ex and sleep  Chief Complaint  Patient presents with  . Follow-up    No compaints or concerns with breathing   Dyspnea:  Maybe two aisles at grocery store  Cough: mostly in am's > gray just in am  Sleeping: bed is flat / one pillow  SABA use: maybe once a day/ very rarely neb  02:  3lpm but thinks it comes off/ 3lpm pulsing am   - not using at rest = 95%    No obvious day to day or daytime variability or assoc excess/ purulent sputum or mucus plugs or hemoptysis or cp or chest tightness, subjective wheeze or overt sinus or hb symptoms.   sleepng as above without nocturnal  or   exacerbation  of respiratory  c/o's or need for noct saba. Also denies any obvious fluctuation of symptoms with weather or environmental changes or other aggravating or alleviating factors except as outlined above   No unusual exposure hx or h/o childhood pna/ asthma or knowledge of premature birth.  Current Allergies, Complete Past Medical History, Past Surgical History, Family History, and Social History were reviewed in [employer] record.  ROS  The following are not active complaints unless bolded Hoarseness, sore throat, dysphagia, dental problems, itching, sneezing,  nasal congestion or discharge of excess mucus or purulent secretions, ear ache,   fever, chills, sweats, unintended wt loss or wt gain, classically pleuritic or exertional cp,  orthopnea pnd or arm/hand  swelling  or leg swelling, presyncope, palpitations, abdominal pain, anorexia,  nausea, vomiting, diarrhea  or change in bowel habits or change in bladder habits, change in stools or change in urine, dysuria, hematuria,  rash, arthralgias, visual complaints, headache, numbness, weakness or ataxia or problems with walking or coordination,  change in mood or  memory.        Current Meds  Medication Sig  . albuterol (PROVENTIL) (2.5 MG/3ML) 0.083% nebulizer solution Take 3 mLs (2.5 mg total) by nebulization every 4 (four) hours as needed for wheezing or shortness of breath.  Marland Kitchen albuterol (VENTOLIN HFA) 108 (90 Base) MCG/ACT inhaler Inhale 2 puffs into the lungs every 6 (six) hours as needed for wheezing or shortness of breath.  Marland Kitchen atorvastatin (LIPITOR) 80 MG tablet TAKE 1 TABLET EVERY DAY  . carvedilol (COREG) 3.125 MG tablet TAKE 1 TABLET (3.125 MG TOTAL) BY MOUTH 2 (TWO) TIMES DAILY.  . DULoxetine (CYMBALTA) 60 MG capsule TAKE 1 CAPSULE (60 MG TOTAL) BY MOUTH DAILY. FOR PARESTHESIA  . ferrous sulfate 324 MG TBEC Take 1 tablet (324 mg total) by mouth daily.  . furosemide (LASIX) 40 MG tablet TAKE 1 TABLET EVERY DAY  . gabapentin (NEURONTIN) 300 MG capsule TAKE 1 CAPSULE (300 MG TOTAL) BY MOUTH AT BEDTIME.  . Misc. Devices MISC Portable oxygen concentrator. Diagnosis COPD.  . pantoprazole (PROTONIX) 40 MG tablet Take 1 tablet (40 mg total) by mouth 2 (two) times daily before a meal.  . TUDORZA PRESSAIR 400 MCG/ACT AEPB INHALE 1 PUFF INTO THE LUNGS IN THE MORNING AND AT BEDTIME.  Marland Kitchen XARELTO 20 MG TABS tablet TAKE 1 TABLET EVERY DAY  WITH  SUPPER           Objective:   Physical Exam   amb bm very congested rattling cough   04/04/2020    212 06/30/2019      189  01/28/2019      191 12/31/2018      189  10/22/2016      190  07/24/2016        209  06/26/2016        213  05/28/2016      194   02/29/16 177 lb 12.8 oz (80.6 kg)  02/24/16 182 lb 12.8 oz (82.9 kg)  02/20/16 175 lb (79.4 kg)    Vital signs reviewed  04/04/2020  - Note at rest 02 sats  98% on 3lpm pulsed           HEENT : pt wearing mask not removed for exam due to covid -19 concerns.    NECK :  without JVD/Nodes/TM/ nl carotid upstrokes bilaterally   LUNGS: no acc muscle use,  Mod barrel  contour chest wall with bilateral  Distant bs s audible wheeze and  without cough on insp or exp maneuvers and mod  Hyperresonant  to  percussion bilaterally     CV:  RRR  no s3 or murmur or increase in P2, and no edema   ABD:  soft and nontender with pos mid insp Hoover's  in the supine position. No bruits or organomegaly appreciated, bowel sounds nl  MS:     ext warm without deformities, calf tenderness, cyanosis or clubbing No obvious joint restrictions   SKIN: warm and dry without lesions    NEURO:  alert, approp, nl sensorium with  no motor or cerebellar deficits apparent.              I personally reviewed images and  agree with radiology impression as follows:   Chest CT LDSCT  01/18/20 Lung-RADS 2, benign appearance or behavior. Continue annual screening with low-dose chest CT without contrast in 12 months.           Assessment & Plan:

## 2020-04-04 NOTE — Assessment & Plan Note (Signed)
See copd - 10/22/2016  Walked RA x 3 laps @ 185 ft each stopped due to  End of study moderate pace, no desat  - HCO03  11/11/18  = 41  - 12/31/2018   Walked 2lpm   2 laps @  approx 284ft each @ avg pace  stopped due to  Sob on 2lpm but no desats   - 01/28/2019   Walked RA x one lap =  approx 250 ft - stopped due to  Sob with sats ok on 3lpm POC > referred for POC  - HCO3  06/15/19 = 38  -  04/04/2020   Walked 3lpm  pulsed  3laps @ approx 223ft each @avg  pace  stopped due desats p second lap to 87% corrected on 4lpm pulsed   With sats 98% at end   rec as of 04/04/2020  = 3lpm 24 / 7 but 4lpm pulsed with walking more than around the house    Each maintenance medication was reviewed in detail including emphasizing most importantly the difference between maintenance and prns and under what circumstances the prns are to be triggered using an action plan format where appropriate.  Total time for H and P, chart review, counseling, teaching device  directly observing portions of ambulatory 02 saturation study/  and generating customized AVS unique to this office visit / charting =31 min

## 2020-04-04 NOTE — Progress Notes (Signed)
PFT done today. 

## 2020-04-04 NOTE — Patient Instructions (Addendum)
Plan A = Automatic = Always=    Breztri Take 2 puffs first thing in am and then another 2 puffs about 12 hours later.   Work on inhaler technique:  relax and gently blow all the way out then take a nice smooth deep breath back in, triggering the inhaler at same time you start breathing in.  Hold for up to 5 seconds if you can. Blow out thru nose. Rinse and gargle with water when done   Plan B = Backup (to supplement plan A, not to replace it) Only use your albuterol inhaler as a rescue medication to be used if you can't catch your breath by resting or doing a relaxed purse lip breathing pattern.  - The less you use it, the better it will work when you need it. - Ok to use the inhaler up to 2 puffs  every 4 hours if you must but call for appointment if use goes up over your usual need - Don't leave home without it !!  (think of it like the spare tire for your car)   Plan C = Crisis (instead of Plan B but only if Plan B stops working) - only use your albuterol nebulizer if you first try Plan B and it fails to help > ok to use the nebulizer up to every 4 hours but if start needing it regularly call for immediate appointment  Rec: 3lpm 24/7 but increase to 4lpm pulsed when walking outside of house  Make sure you check your oxygen saturations at highest level of activity to be sure it stays over 90% and adjust  02 flow upward to maintain this level if needed but remember to turn it back to previous settings when you stop (to conserve your supply).    Please schedule a follow up visit in 6 months but call sooner if needed

## 2020-04-06 ENCOUNTER — Other Ambulatory Visit: Payer: Self-pay

## 2020-04-06 ENCOUNTER — Other Ambulatory Visit: Payer: Medicare HMO | Admitting: *Deleted

## 2020-04-06 DIAGNOSIS — Z7901 Long term (current) use of anticoagulants: Secondary | ICD-10-CM | POA: Diagnosis not present

## 2020-04-06 DIAGNOSIS — D509 Iron deficiency anemia, unspecified: Secondary | ICD-10-CM | POA: Diagnosis not present

## 2020-04-06 DIAGNOSIS — D649 Anemia, unspecified: Secondary | ICD-10-CM | POA: Diagnosis not present

## 2020-04-07 LAB — CBC
Hematocrit: 32.5 % — ABNORMAL LOW (ref 37.5–51.0)
Hemoglobin: 8.8 g/dL — ABNORMAL LOW (ref 13.0–17.7)
MCH: 21 pg — ABNORMAL LOW (ref 26.6–33.0)
MCHC: 27.1 g/dL — ABNORMAL LOW (ref 31.5–35.7)
MCV: 78 fL — ABNORMAL LOW (ref 79–97)
NRBC: 1 % — ABNORMAL HIGH (ref 0–0)
Platelets: 188 10*3/uL (ref 150–450)
RBC: 4.19 x10E6/uL (ref 4.14–5.80)
RDW: 19.5 % — ABNORMAL HIGH (ref 11.6–15.4)
WBC: 6.8 10*3/uL (ref 3.4–10.8)

## 2020-04-07 LAB — FOLATE: Folate: >20 ng/mL (ref >3.0)

## 2020-04-07 LAB — FERRITIN: Ferritin: 26 ng/mL — ABNORMAL LOW (ref 30–400)

## 2020-04-07 LAB — IRON AND TIBC
Iron Saturation: 65 % — ABNORMAL HIGH (ref 15–55)
Iron: 258 ug/dL — ABNORMAL HIGH (ref 38–169)
Total Iron Binding Capacity: 398 ug/dL (ref 250–450)
UIBC: 140 ug/dL (ref 111–343)

## 2020-04-07 LAB — VITAMIN B12: Vitamin B-12: 1692 pg/mL — ABNORMAL HIGH (ref 232–1245)

## 2020-04-22 ENCOUNTER — Other Ambulatory Visit: Payer: Self-pay | Admitting: Family Medicine

## 2020-04-22 DIAGNOSIS — I110 Hypertensive heart disease with heart failure: Secondary | ICD-10-CM

## 2020-04-22 DIAGNOSIS — I5042 Chronic combined systolic (congestive) and diastolic (congestive) heart failure: Secondary | ICD-10-CM

## 2020-04-22 NOTE — Telephone Encounter (Addendum)
Courtesy refill of Lasix given. Patient scheduled for appt on 06/14/20.

## 2020-04-25 ENCOUNTER — Other Ambulatory Visit: Payer: Self-pay | Admitting: Family Medicine

## 2020-04-25 DIAGNOSIS — I110 Hypertensive heart disease with heart failure: Secondary | ICD-10-CM

## 2020-04-25 MED FILL — CARVEDILOL 3.125 MG TABLET: 3.125 | 30 days supply | Qty: 60 | Fill #0

## 2020-04-25 NOTE — Telephone Encounter (Signed)
Appointment canceled and rescheduled by office- courtesy RF given

## 2020-04-27 ENCOUNTER — Other Ambulatory Visit: Payer: Self-pay | Admitting: Internal Medicine

## 2020-04-27 ENCOUNTER — Telehealth: Payer: Self-pay | Admitting: Internal Medicine

## 2020-04-27 MED ORDER — BREZTRI AEROSPHERE 160-9-4.8 MCG/ACT IN AERO
2.0000 | INHALATION_SPRAY | Freq: Two times a day (BID) | RESPIRATORY_TRACT | 11 refills | Status: DC
Start: 2020-04-27 — End: 2020-04-27

## 2020-04-27 MED FILL — BREZTRI AEROSPHERE 160-9-4.: 160-9-4.8 | 30 days supply | Qty: 11 | Fill #0

## 2020-04-27 NOTE — Telephone Encounter (Signed)
Called and spoke with patient to let him know that I would send in prescription for Breztri to [facility]. Patient expressed understanding. RX sent. Nothing further needed at this time.

## 2020-04-29 MED FILL — GABAPENTIN 300 MG CAPSULE: 300 | 30 days supply | Qty: 30 | Fill #1

## 2020-05-03 ENCOUNTER — Other Ambulatory Visit: Payer: Self-pay | Admitting: Family Medicine

## 2020-05-03 MED FILL — DULoxetine HCL 60 MG CPEP: 60 | 30 days supply | Qty: 30 | Fill #0

## 2020-05-10 ENCOUNTER — Other Ambulatory Visit: Payer: Medicare HMO | Admitting: *Deleted

## 2020-05-10 ENCOUNTER — Other Ambulatory Visit: Payer: Self-pay

## 2020-05-10 ENCOUNTER — Other Ambulatory Visit: Payer: Self-pay | Admitting: *Deleted

## 2020-05-10 DIAGNOSIS — I483 Typical atrial flutter: Secondary | ICD-10-CM | POA: Diagnosis not present

## 2020-05-10 LAB — CBC
Hematocrit: 40.9 % (ref 37.5–51.0)
Hemoglobin: 11.3 g/dL — ABNORMAL LOW (ref 13.0–17.7)
MCH: 22.3 pg — ABNORMAL LOW (ref 26.6–33.0)
MCHC: 27.6 g/dL — ABNORMAL LOW (ref 31.5–35.7)
MCV: 81 fL (ref 79–97)
Platelets: 199 10*3/uL (ref 150–450)
RBC: 5.07 x10E6/uL (ref 4.14–5.80)
RDW: 17.6 % — ABNORMAL HIGH (ref 11.6–15.4)
WBC: 5.4 10*3/uL (ref 3.4–10.8)

## 2020-05-30 ENCOUNTER — Other Ambulatory Visit: Payer: Self-pay | Admitting: Family Medicine

## 2020-05-30 DIAGNOSIS — I5042 Chronic combined systolic (congestive) and diastolic (congestive) heart failure: Secondary | ICD-10-CM

## 2020-05-30 DIAGNOSIS — I110 Hypertensive heart disease with heart failure: Secondary | ICD-10-CM

## 2020-05-30 MED FILL — CARVEDILOL 3.125 MG TABLET: 3.125 | 30 days supply | Qty: 60 | Fill #0

## 2020-05-30 MED FILL — DULoxetine HCL 60 MG CPEP: 60 | 30 days supply | Qty: 30 | Fill #1

## 2020-05-30 MED FILL — GABAPENTIN 300 MG CAPSULE: 300 | 30 days supply | Qty: 30 | Fill #2

## 2020-06-08 MED FILL — BREZTRI AEROSPHERE 160-9-4.: 160-9-4.8 | 30 days supply | Qty: 11 | Fill #1

## 2020-06-14 ENCOUNTER — Other Ambulatory Visit: Payer: Self-pay

## 2020-06-14 ENCOUNTER — Encounter: Payer: Self-pay | Admitting: Family Medicine

## 2020-06-14 ENCOUNTER — Other Ambulatory Visit: Payer: Self-pay | Admitting: Family Medicine

## 2020-06-14 ENCOUNTER — Ambulatory Visit: Payer: Medicare HMO | Attending: Family Medicine | Admitting: Family Medicine

## 2020-06-14 VITALS — BP 118/77 | HR 103 | Ht 75.0 in | Wt 223.0 lb

## 2020-06-14 DIAGNOSIS — I4892 Unspecified atrial flutter: Secondary | ICD-10-CM | POA: Diagnosis not present

## 2020-06-14 DIAGNOSIS — R140 Abdominal distension (gaseous): Secondary | ICD-10-CM | POA: Diagnosis not present

## 2020-06-14 DIAGNOSIS — H6121 Impacted cerumen, right ear: Secondary | ICD-10-CM

## 2020-06-14 DIAGNOSIS — I110 Hypertensive heart disease with heart failure: Secondary | ICD-10-CM | POA: Diagnosis not present

## 2020-06-14 DIAGNOSIS — J449 Chronic obstructive pulmonary disease, unspecified: Secondary | ICD-10-CM

## 2020-06-14 DIAGNOSIS — I5042 Chronic combined systolic (congestive) and diastolic (congestive) heart failure: Secondary | ICD-10-CM

## 2020-06-14 MED ORDER — CARVEDILOL 3.125 MG PO TABS: 3.1250 mg | ORAL_TABLET | Freq: Two times a day (BID) | ORAL | 1 refills | Status: DC

## 2020-06-14 MED ORDER — DULOXETINE HCL 60 MG PO CPEP
60.0000 mg | ORAL_CAPSULE | Freq: Every day | ORAL | 1 refills | Status: DC
Start: 2020-06-14 — End: 2020-09-08

## 2020-06-14 MED ORDER — RIVAROXABAN 20 MG PO TABS: ORAL_TABLET | ORAL | 1 refills | Status: DC

## 2020-06-14 MED ORDER — ATORVASTATIN CALCIUM 80 MG PO TABS: 80.0000 mg | ORAL_TABLET | Freq: Every day | ORAL | 1 refills | Status: DC

## 2020-06-14 MED ORDER — FUROSEMIDE 40 MG PO TABS: 40.0000 mg | ORAL_TABLET | Freq: Every day | ORAL | 1 refills | Status: DC

## 2020-06-14 MED ORDER — NITROGLYCERIN 0.4 MG SL SUBL: 0.4000 mg | SUBLINGUAL_TABLET | SUBLINGUAL | 1 refills | Status: DC | PRN

## 2020-06-14 MED FILL — NITROGLYCERIN 0.4 MG TAB SL: 0.4 | 25 days supply | Qty: 25 | Fill #0

## 2020-06-14 NOTE — Progress Notes (Signed)
Subjective:  Patient ID: Jared [name], male    DOB: 03-01-1956  Age: 65 y.o. 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  Last office visit was in 06/2019 Last seen by cardiology in 01/2020 and is currently on Xarelto for his A flutter. Also seen by pulmonary Dr. Melvyn Novas in 03/2020; currently on [place]. He started smoking again about 5 cig/day.  States he is constantly dyspneic but this is absent at rest. Uses Oxygen only as needed.  His abdomen is bloated after eating a bit and he has early satiety. Denies nausea, vomiting, blood in stool. Endorses presence of intermittent spasms which starts from the left side of his chest and moved to the right side of his chest and down his abdomen which is absent at this time.  Was previously on Protonix which he states he has run out of. Complains of right ear being clogged and he would like it to be cleaned out.  Past Medical History:  Diagnosis Date  . CAD (coronary artery disease)    a. LHC 5/12:  LAD 20, pLCx 20, pRCA 40, dRCA 40, EF 35%, diff HK  //  b. Myoview 4/16: Overall Impression: High risk stress nuclear study There is no evidence of ischemia. There is severe LV dysfunction. LV Ejection Fraction: 30%. LV Wall Motion: There is global LV hypokinesis.   Marland Kitchen CAP (community acquired pneumonia) 09/2013  . Chronic combined systolic and diastolic CHF (congestive heart failure) (HCC)    a. Echo 4/16:Mild LVH, EF 40-45%, diffuse HK //  b. Echo 8/17: EF 35-40%, diffuse HK, diastolic dysfunction, aortic sclerosis, trivial MR, moderate LAE, normal RVSF, moderate RAE, mild TR, PASP 42 mmHg //  c. Echo 4/18: Mild concentric LVH, EF 30-35, normal wall motion, grade 1 diastolic dysfunction, PASP 49  . Cluster headache    "hx; haven't had one in awhile" (01/09/2016)  . COPD (chronic obstructive pulmonary disease) ([place])    [name] 01/09/2016  . History of CVA (cerebrovascular accident)   . Hypertension   . NICM (nonischemic cardiomyopathy) ([place])   . Tobacco abuse     Past Surgical History:  Procedure Laterality Date  . CARDIAC CATHETERIZATION  10/2010   LM normal, LAD with 20% irregularities, LCX with 20%, RCA with 40% prox and 40% distal - EF of 35%  . CATARACT EXTRACTION, BILATERAL    . COLONOSCOPY W/ BIOPSIES AND POLYPECTOMY    . COLONOSCOPY WITH PROPOFOL N/A 09/06/2018   Procedure: COLONOSCOPY WITH PROPOFOL;  Surgeon: Thornton Park, MD;  Location: [place];  Service: Gastroenterology;  Laterality: N/A;  . ENTEROSCOPY N/A 09/28/2018   Procedure: ENTEROSCOPY;  Surgeon: Carol Ada, MD;  Location: [facility] ENDOSCOPY;  Service: Endoscopy;  Laterality: N/A;  . ENTEROSCOPY N/A 10/28/2018   Procedure: ENTEROSCOPY;  Surgeon: Thornton Park, MD;  Location: [place];  Service: Gastroenterology;  Laterality: N/A;  . ESOPHAGOGASTRODUODENOSCOPY (EGD) WITH PROPOFOL N/A 09/05/2018   Procedure: ESOPHAGOGASTRODUODENOSCOPY (EGD) WITH PROPOFOL;  Surgeon: Yetta Flock, MD;  Location: [place];  Service: Gastroenterology;  Laterality: N/A;  . EXCISION MASS HEAD    . GIVENS CAPSULE STUDY N/A 09/06/2018   Procedure: GIVENS CAPSULE STUDY;  Surgeon: Tressia Danas, MD;  Location: [facility] ENDOSCOPY;  Service: Gastroenterology;  Laterality: N/A;  . GIVENS CAPSULE STUDY N/A 09/26/2018   Procedure: GIVENS CAPSULE STUDY;  Surgeon: Beverley Fiedler, MD;  Location: [facility] ENDOSCOPY;  Service: Gastroenterology;  Laterality: N/A;  . GIVENS CAPSULE STUDY N/A 06/14/2019   Procedure: GIVENS CAPSULE STUDY;  Surgeon: Napoleon Form, MD;  Location: [facility] ENDOSCOPY;  Service: Endoscopy;  Laterality: N/A;  . HOT  HEMOSTASIS N/A 10/28/2018   Procedure: HOT HEMOSTASIS (ARGON PLASMA COAGULATION/BICAP);  Surgeon: Tressia Danas, MD;  Location: [facility] ENDOSCOPY;  Service: Gastroenterology;  Laterality: N/A;  . INCISION AND DRAINAGE PERIRECTAL ABSCESS N/A 06/05/2017   Procedure: IRRIGATION AND DEBRIDEMENT PERIRECTAL ABSCESS;  Surgeon: Andria Meuse, MD;  Location: [facility] OR;  Service: General;  Laterality: N/A;  . VIDEO BRONCHOSCOPY Bilateral 05/08/2016   Procedure: VIDEO BRONCHOSCOPY WITH FLUORO;  Surgeon: Oretha Milch, MD;  Location: [facility] ENDOSCOPY;  Service: Cardiopulmonary;  Laterality: Bilateral;    Family History  Problem Relation Age of Onset  . Heart disease Mother   . Diabetes Mother   . Colon cancer Mother   . Liver cancer Mother   . Cancer Father        type unknown  . Diabetes Sister        x 2  . Diabetes Brother     Allergies  Allergen Reactions  . Lisinopril Cough    Outpatient Medications Prior to Visit  Medication Sig Dispense Refill  . albuterol (PROVENTIL) (2.5 MG/3ML) 0.083% nebulizer solution Take 3 mLs (2.5 mg total) by nebulization every 4 (four) hours as needed for wheezing or shortness of breath. 75 mL 12  . albuterol (VENTOLIN HFA) 108 (90 Base) MCG/ACT inhaler Inhale 2 puffs into the lungs every 6 (six) hours as needed for wheezing or shortness of breath. 1 Inhaler 5  . Budeson-Glycopyrrol-Formoterol (BREZTRI AEROSPHERE) 160-9-4.8 MCG/ACT AERO Inhale 2 puffs into the lungs 2 (two) times daily. 10.7 g 11  . ferrous sulfate 324 MG TBEC Take 1 tablet (324 mg total) by mouth daily. 30 tablet   . gabapentin (NEURONTIN) 300 MG capsule TAKE 1 CAPSULE (300 MG TOTAL) BY MOUTH AT BEDTIME. 30 capsule 6  . Misc. Devices MISC Portable oxygen concentrator. Diagnosis COPD. 1 each 0  . atorvastatin (LIPITOR) 80 MG tablet TAKE 1 TABLET EVERY DAY 90 tablet 0  . DULoxetine (CYMBALTA) 60 MG capsule TAKE 1 CAPSULE (60 MG TOTAL) BY MOUTH DAILY. FOR PARESTHESIA 30 capsule 1  . furosemide  (LASIX) 40 MG tablet TAKE 1 TABLET EVERY DAY 90 tablet 0  . XARELTO 20 MG TABS tablet TAKE 1 TABLET EVERY DAY  WITH  SUPPER 90 tablet 0  . fluticasone (FLONASE) 50 MCG/ACT nasal spray Place 1 spray into both nostrils daily for 30 days. 0.002 g 0  . pantoprazole (PROTONIX) 40 MG tablet Take 1 tablet (40 mg total) by mouth 2 (two) times daily before a meal. (Patient not taking: Reported on 06/14/2020) 60 tablet 0  . carvedilol (COREG) 3.125 MG tablet TAKE 1 TABLET (3.125 MG TOTAL) BY MOUTH 2 (TWO) TIMES DAILY. (Patient not taking: Reported on 06/14/2020) 60 tablet 0   No facility-administered medications prior to visit.     ROS Review of Systems  Constitutional: Negative for activity change and appetite change.  HENT: Negative for sinus pressure and sore throat.   Eyes: Negative for visual disturbance.  Respiratory: Positive for shortness of breath. Negative for cough and chest tightness.   Cardiovascular: Negative for chest  pain and leg swelling.  Gastrointestinal: Negative for abdominal distention, abdominal pain, constipation and diarrhea.  Endocrine: Negative.   Genitourinary: Negative for dysuria.  Musculoskeletal: Negative for joint swelling and myalgias.  Skin: Negative for rash.  Allergic/Immunologic: Negative.   Neurological: Negative for weakness, light-headedness and numbness.  Psychiatric/Behavioral: Negative for dysphoric mood and suicidal ideas.    Objective:  BP 118/77   Pulse (!) 103   Ht 6\' 3"  (1.905 m)   Wt 223 lb (101.2 kg)   SpO2 93%   BMI 27.87 kg/m   BP/Weight 06/14/2020 04/04/2020 01/29/2020  Systolic BP 118 118 130  Diastolic BP 77 66 68  Wt. (Lbs) 223 212.2 214.2  BMI 27.87 26.88 26.77      Physical Exam Constitutional:      Appearance: He is well-developed.  Neck:     Vascular: No JVD.  Cardiovascular:     Rate and Rhythm: Normal rate.     Heart sounds: Normal heart sounds. No murmur heard.   Pulmonary:     Effort: Pulmonary effort is normal.      Breath sounds: Normal breath sounds. No wheezing or rales.  Chest:     Chest wall: No tenderness.  Abdominal:     General: Bowel sounds are normal. There is no distension.     Palpations: Abdomen is soft. There is no mass.     Tenderness: There is no abdominal tenderness.  Musculoskeletal:        General: Normal range of motion.     Right lower leg: No edema.     Left lower leg: No edema.  Neurological:     Mental Status: He is alert and oriented to person, place, and time.  Psychiatric:        Mood and Affect: Mood normal.     CMP Latest Ref Rng & Units 01/29/2020 06/15/2019 06/14/2019  Glucose 65 - 99 mg/dL 90 84 95  BUN 8 - 27 mg/dL 15 5(L) 08/12/2019)  Creatinine 0.76 - 1.27 mg/dL <9(T 2.67 1.24  Sodium 134 - 144 mmol/L 143 143 140  Potassium 3.5 - 5.2 mmol/L 4.0 3.9 3.9  Chloride 96 - 106 mmol/L 101 97(L) 96(L)  CO2 20 - 29 mmol/L 32(H) 38(H) 38(H)  Calcium 8.6 - 10.2 mg/dL 9.3 5.80) 9.9(I)  Total Protein 6.5 - 8.1 g/dL - 5.8(L) -  Total Bilirubin 0.3 - 1.2 mg/dL - 0.7 -  Alkaline Phos 38 - 126 U/L - 60 -  AST 15 - 41 U/L - 18 -  ALT 0 - 44 U/L - 15 -    Lipid Panel     Component Value Date/Time   CHOL 148 01/29/2020 1020   TRIG 138 01/29/2020 1020   HDL 51 01/29/2020 1020   CHOLHDL 2.9 01/29/2020 1020   CHOLHDL 2.8 01/11/2016 0446   VLDL 14 01/11/2016 0446   LDLCALC 73 01/29/2020 1020    CBC    Component Value Date/Time   WBC 5.4 05/10/2020 1000   WBC 7.9 09/17/2019 0748   WBC 6.8 06/15/2019 0724   RBC 5.07 05/10/2020 1000   RBC 6.03 (H) 09/17/2019 0748   HGB 11.3 (L) 05/10/2020 1000   HCT 40.9 05/10/2020 1000   PLT 199 05/10/2020 1000   MCV 81 05/10/2020 1000   MCH 22.3 (L) 05/10/2020 1000   MCH 27.9 09/17/2019 0748   MCHC 27.6 (L) 05/10/2020 1000   MCHC 29.5 (L) 09/17/2019 0748   RDW 17.6 (H) 05/10/2020 1000   LYMPHSABS 2.8 09/17/2019  0748   LYMPHSABS 2.5 06/24/2019 0945   MONOABS 0.7 09/17/2019 0748   EOSABS 0.1 09/17/2019 0748   EOSABS 0.2  06/24/2019 0945   BASOSABS 0.0 09/17/2019 0748   BASOSABS 0.1 06/24/2019 0945    Lab Results  Component Value Date   HGBA1C 5.6 05/18/2019    Assessment & Plan:  1. Hypertensive heart disease with chronic combined systolic and diastolic congestive heart failure (HCC) EF 45 to 50% from echo of 04/2018 NYHA III Continue beta-blocker, Not on ACE inhibitor due to allergy Placed on nitroglycerin as needed chest pain advised to notify cardiologist as well.  Chest pain is absent at this time - furosemide (LASIX) 40 MG tablet; Take 1 tablet (40 mg total) by mouth daily.  Dispense: 90 tablet; Refill: 1 - atorvastatin (LIPITOR) 80 MG tablet; Take 1 tablet (80 mg total) by mouth daily.  Dispense: 90 tablet; Refill: 1 - carvedilol (COREG) 3.125 MG tablet; Take 1 tablet (3.125 mg total) by mouth 2 (two) times daily.  Dispense: 180 tablet; Refill: 1 - nitroGLYCERIN (NITROSTAT) 0.4 MG SL tablet; Place 1 tablet (0.4 mg total) under the tongue every 5 (five) minutes as needed for chest pain. Call 911 if symptoms do not resolve after 3 minutes  Dispense: 30 tablet; Refill: 1  2. Impacted cerumen of right ear Right ear irrigation performed  3. Abdominal bloating We will check H. pylori breath test and if negative refill Protonix and refer to GI - H. pylori breath test  4. COPD GOLD III/still smoking  Advised against smoking cessation; he is not ready to quit Continue Breztri On oxygen as needed Follow up with pulmonary  5. Atrial flutter, unspecified type (HCC) On anticoagulation with Xarelto and rate control with beta-blocker - rivaroxaban (XARELTO) 20 MG TABS tablet; TAKE 1 TABLET EVERY DAY  WITH  SUPPER  Dispense: 90 tablet; Refill: 1    Meds ordered this encounter  Medications  . furosemide (LASIX) 40 MG tablet    Sig: Take 1 tablet (40 mg total) by mouth daily.    Dispense:  90 tablet    Refill:  1  . DULoxetine (CYMBALTA) 60 MG capsule    Sig: Take 1 capsule (60 mg total) by mouth  daily. For paresthesia    Dispense:  30 capsule    Refill:  1  . atorvastatin (LIPITOR) 80 MG tablet    Sig: Take 1 tablet (80 mg total) by mouth daily.    Dispense:  90 tablet    Refill:  1  . carvedilol (COREG) 3.125 MG tablet    Sig: Take 1 tablet (3.125 mg total) by mouth 2 (two) times daily.    Dispense:  180 tablet    Refill:  1  . rivaroxaban (XARELTO) 20 MG TABS tablet    Sig: TAKE 1 TABLET EVERY DAY  WITH  SUPPER    Dispense:  90 tablet    Refill:  1  . nitroGLYCERIN (NITROSTAT) 0.4 MG SL tablet    Sig: Place 1 tablet (0.4 mg total) under the tongue every 5 (five) minutes as needed for chest pain. Call 911 if symptoms do not resolve after 3 minutes    Dispense:  30 tablet    Refill:  1    Follow-up: Return in about 6 months (around 12/12/2020) for Chronic disease management.       [name], MD, FAAFP. [facility] [place], [place] [phone]   06/14/2020, 2:41 PM

## 2020-06-15 LAB — H. PYLORI BREATH TEST: H pylori Breath Test: NEGATIVE

## 2020-06-16 ENCOUNTER — Telehealth: Payer: Self-pay

## 2020-06-16 ENCOUNTER — Other Ambulatory Visit: Payer: Self-pay | Admitting: Family Medicine

## 2020-06-16 DIAGNOSIS — R140 Abdominal distension (gaseous): Secondary | ICD-10-CM

## 2020-06-16 NOTE — Telephone Encounter (Signed)
-----   Message from [name], MD sent at 06/16/2020  2:20 PM EST ----- H.Pylori test is negative. I have referred him to GI

## 2020-06-16 NOTE — Telephone Encounter (Signed)
Pt was called and informed of lab results via VM. 

## 2020-07-04 MED FILL — GABAPENTIN 300 MG CAPSULE: 300 | 30 days supply | Qty: 30 | Fill #3

## 2020-07-04 MED FILL — CARVEDILOL 3.125 MG TABLET: 3.125 | 90 days supply | Qty: 180 | Fill #0

## 2020-07-04 MED FILL — DULoxetine HCL 60 MG CPEP: 60 | 30 days supply | Qty: 30 | Fill #0

## 2020-07-05 MED FILL — BREZTRI AEROSPHERE 160-9-4.: 160-9-4.8 | 30 days supply | Qty: 11 | Fill #2

## 2020-07-09 ENCOUNTER — Other Ambulatory Visit: Payer: Self-pay | Admitting: Family Medicine

## 2020-07-09 DIAGNOSIS — I110 Hypertensive heart disease with heart failure: Secondary | ICD-10-CM

## 2020-07-09 DIAGNOSIS — I4892 Unspecified atrial flutter: Secondary | ICD-10-CM

## 2020-08-03 ENCOUNTER — Other Ambulatory Visit: Payer: Self-pay | Admitting: Family Medicine

## 2020-08-03 DIAGNOSIS — I4892 Unspecified atrial flutter: Secondary | ICD-10-CM

## 2020-08-03 DIAGNOSIS — I110 Hypertensive heart disease with heart failure: Secondary | ICD-10-CM

## 2020-08-03 DIAGNOSIS — I5042 Chronic combined systolic (congestive) and diastolic (congestive) heart failure: Secondary | ICD-10-CM

## 2020-08-09 ENCOUNTER — Other Ambulatory Visit: Payer: Self-pay

## 2020-08-09 ENCOUNTER — Other Ambulatory Visit (INDEPENDENT_AMBULATORY_CARE_PROVIDER_SITE_OTHER): Payer: Medicare HMO

## 2020-08-09 ENCOUNTER — Ambulatory Visit (INDEPENDENT_AMBULATORY_CARE_PROVIDER_SITE_OTHER): Payer: Medicare HMO | Admitting: Gastroenterology

## 2020-08-09 ENCOUNTER — Encounter: Payer: Self-pay | Admitting: Gastroenterology

## 2020-08-09 VITALS — BP 128/76 | HR 68 | Ht 75.0 in | Wt 221.0 lb

## 2020-08-09 DIAGNOSIS — R140 Abdominal distension (gaseous): Secondary | ICD-10-CM

## 2020-08-09 DIAGNOSIS — R6881 Early satiety: Secondary | ICD-10-CM

## 2020-08-09 DIAGNOSIS — D500 Iron deficiency anemia secondary to blood loss (chronic): Secondary | ICD-10-CM | POA: Diagnosis not present

## 2020-08-09 LAB — COMPREHENSIVE METABOLIC PANEL
ALT: 24 U/L (ref 0–53)
AST: 27 U/L (ref 0–37)
Albumin: 4.3 g/dL (ref 3.5–5.2)
Alkaline Phosphatase: 62 U/L (ref 39–117)
BUN: 18 mg/dL (ref 6–23)
CO2: 35 mEq/L — ABNORMAL HIGH (ref 19–32)
Calcium: 9.4 mg/dL (ref 8.4–10.5)
Chloride: 100 mEq/L (ref 96–112)
Creatinine, Ser: 1.06 mg/dL (ref 0.40–1.50)
GFR: 72.98 mL/min (ref >60.00)
Glucose, Bld: 103 mg/dL — ABNORMAL HIGH (ref 70–99)
Potassium: 4 mEq/L (ref 3.5–5.1)
Sodium: 141 mEq/L (ref 135–145)
Total Bilirubin: 0.5 mg/dL (ref 0.2–1.2)
Total Protein: 7.4 g/dL (ref 6.0–8.3)

## 2020-08-09 LAB — CBC
HCT: 35.4 % — ABNORMAL LOW (ref 39.0–52.0)
Hemoglobin: 10.4 g/dL — ABNORMAL LOW (ref 13.0–17.0)
MCHC: 29.4 g/dL — ABNORMAL LOW (ref 30.0–36.0)
MCV: 77.6 fl — ABNORMAL LOW (ref 78.0–100.0)
Platelets: 254 10*3/uL (ref 150.0–400.0)
RBC: 4.57 Mil/uL (ref 4.22–5.81)
RDW: 19.6 % — ABNORMAL HIGH (ref 11.5–15.5)
WBC: 6.6 10*3/uL (ref 4.0–10.5)

## 2020-08-09 MED FILL — DULoxetine HCL 60 MG CPEP: 60 | 30 days supply | Qty: 30 | Fill #1

## 2020-08-09 MED FILL — GABAPENTIN 300 MG CAPSULE: 300 | 30 days supply | Qty: 30 | Fill #4

## 2020-08-09 NOTE — Patient Instructions (Signed)
If you are age 67 or older, your body mass index should be between 23-30. Your Body mass index is 27.62 kg/m. If this is out of the aforementioned range listed, please consider follow up with your Primary Care Provider.  If you are age 60 or younger, your body mass index should be between 19-25. Your Body mass index is 27.62 kg/m. If this is out of the aformentioned range listed, please consider follow up with your Primary Care Provider.   IMAGING:  . You will be contacted by [facility] Scheduling (Your caller ID will indicate phone # [phone]) in the next 2 days to schedule your CT scan. If you have not heard from them within 2 business days, please call [facility] Scheduling at [phone] to follow up on the status of your appointment.    LABS:  Lab work has been ordered for you today. Our lab is located in the basement. Press "B" on the elevator. The lab is located at the first door on the left as you exit the elevator.  HEALTHCARE LAWS AND MY CHART RESULTS: Due to recent changes in healthcare laws, you may see the results of your imaging and laboratory studies on MyChart before your provider has had a chance to review them.   We understand that in some cases there may be results that are confusing or concerning to you. Not all laboratory results come back in the same time frame and the provider may be waiting for multiple results in order to interpret others.  Please give [place] 48 hours in order for your provider to thoroughly review all the results before contacting the office for clarification of your results.   It was great seeing you today! Thank you for entrusting me with your care and choosing [facility].  Dr. Myrtie Neither

## 2020-08-09 NOTE — Progress Notes (Signed)
CT scheduling request sent:  RADIOLOGY [facility]  [facility] Phone: [phone] Fax: [phone]   Patient Name: Jared [name] DOB: [birth year]  MRN #: [id number]  Imaging Ordered: CT abd pelvis  Diagnosis: Abdominal bloating/distention  Ordering Provider: Dr. Myrtie Neither  Is a Prior Authorization needed? We are in the process of obtaining it now  Is the patient Diabetic? No  Does the patient have Hypertension? Yes  Does the patient have any implanted devices or hardware? No  Date of last BUN/Creat, if needed? 08/09/20  Patient Weight? 221lb  Is the patient able to get on the table? Yes  Has the patient been diagnosed with COVID? No  Is the patient waiting on COVID testing results? No  Thank you for your assistance! [facility]

## 2020-08-09 NOTE — Progress Notes (Signed)
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  Despite extensive work-up, this problem remains complex and most likely cannot be definitively solved endoscopically.  Small bowel AVMs typically evanescent, making them difficult to diagnose and treat endoscopically, particularly when they are in the length the small bowel. At this point, I believe our best path forward is hematology referral for close monitor of hemoglobin and iron levels, with periodic IV iron treatments.  I expect that we will be more successful in oral iron in keeping his hemoglobin in a safer range, improving his functional status and try to keep him out of the hospital.   If that is attended to but he still has severe anemia, then strong consideration should be given to permanently discontinuing his oral anticoagulation.  Naturally, that would carry the risk of thromboembolic stroke from his A. fib, however the recurrent severe anemia also presents a high risk to him." _________________________________________________  Last small bowel enteroscopy May 2020 with small AVM ablated, otherwise normal exam  Marcelus reports that for about the last 6 months he has chronic abdominal bloating that often is worse after meals and might have visible abdominal distention.  He denies nausea vomiting or dysphagia.  He does describe early satiety but believes his weight is stable overall.  Bowel habits regular once or twice a day and denies rectal bleeding.  He has been taking some OTC Alka-Seltzer for relief of symptoms  (Patient was late to today's visit) ROS: Cardiovascular:  no chest pain Respiratory: Chronic dyspnea and nonproductive cough No black or bloody stool  The patient's Past Medical, Family and Social History were reviewed and are on file in the EMR. Most recent primary care and  pulmonary clinic notes were reviewed He continues to smoke Objective:  Med list reviewed  Current Outpatient Medications:  .  albuterol (PROVENTIL) (2.5 MG/3ML) 0.083% nebulizer solution, Take 3 mLs (2.5 mg total) by nebulization every 4 (four) hours as needed for wheezing or shortness of breath., Disp: 75 mL, Rfl: 12 .  albuterol (VENTOLIN HFA) 108 (90 Base) MCG/ACT inhaler, Inhale 2 puffs into the lungs every 6 (six) hours as needed for wheezing or shortness of breath., Disp: 1 Inhaler, Rfl: 5 .  Budeson-Glycopyrrol-Formoterol (BREZTRI AEROSPHERE) 160-9-4.8 MCG/ACT AERO, Inhale 2 puffs into the lungs 2 (two) times daily., Disp: 10.7 g, Rfl: 11 .  carvedilol (COREG) 3.125 MG tablet, Take 1 tablet (3.125 mg total) by mouth 2 (two) times daily., Disp: 180 tablet, Rfl: 1 .  DULoxetine (CYMBALTA) 60 MG capsule, Take 1 capsule (60 mg total) by mouth daily. For paresthesia, Disp: 30 capsule, Rfl: 1 .  ferrous sulfate 324 MG TBEC, Take 1 tablet (324 mg total) by mouth daily., Disp: 30 tablet, Rfl:  .  furosemide (LASIX) 40 MG tablet, TAKE 1 TABLET EVERY DAY, Disp: 90 tablet, Rfl: 1 .  gabapentin (NEURONTIN) 300 MG capsule, TAKE 1 CAPSULE (300 MG TOTAL) BY MOUTH AT BEDTIME., Disp: 30 capsule, Rfl: 6 .  Misc. Devices MISC, Portable oxygen concentrator. Diagnosis COPD., Disp: 1 each, Rfl: 0 .  nitroGLYCERIN (NITROSTAT) 0.4 MG SL tablet, Place 1 tablet (0.4 mg total) under the tongue every 5 (five) minutes as needed for chest pain. Call 911 if symptoms do not resolve after 3 minutes, Disp: 30 tablet, Rfl: 1 .  XARELTO 20 MG TABS tablet, TAKE 1 TABLET EVERY DAY  WITH  SUPPER, Disp: 90 tablet, Rfl: 1 .  atorvastatin (LIPITOR) 80 MG tablet, TAKE 1 TABLET EVERY DAY (Patient not taking: Reported on 08/09/2020), Disp: 90 tablet, Rfl: 1 .  fluticasone (FLONASE) 50 MCG/ACT nasal spray, Place 1 spray into both nostrils daily for 30 days., Disp: 0.002 g, Rfl: 0 .  pantoprazole (PROTONIX) 40 MG tablet, Take 1 tablet  (40 mg total) by mouth 2 (two) times daily before a meal. (Patient not taking: No sig reported), Disp: 60 tablet, Rfl: 0   Vital signs in last 24 hrs: Vitals:   08/09/20 0832  BP: 128/76  Pulse: 68  SpO2: 94%   Wt Readings from Last 3 Encounters:  08/09/20 221 lb (100.2 kg)  06/14/20 223 lb (101.2 kg)  04/04/20 212 lb 3.2 oz (96.3 kg)    Physical Exam  Breathing comfortably on room air, gets on exam table without difficulty.  HEENT: sclera anicteric, oral mucosa moist without lesions  Neck: supple, no thyromegaly, JVD or lymphadenopathy  Cardiac: RRR without murmurs, S1S2 heard, trace right sided pretibial edema  Pulm: clear to auscultation bilaterally, normal RR and effort noted  Abdomen: soft, mild upper tenderness (over a small rectus diastasis), with active bowel sounds. No guarding or palpable hepatosplenomegaly.  Skin; warm and dry, no jaundice or rash  Labs:  Recent H. pylori breath test negative  CBC Latest Ref Rng & Units 05/10/2020 04/06/2020 03/30/2020  WBC 3.4 - 10.8 x10E3/uL 5.4 6.8 7.7  Hemoglobin 13.0 - 17.7 g/dL 11.3(L) 8.8(L) 8.7(L)  Hematocrit 37.5 - 51.0 % 40.9 32.5(L) 32.6(L)  Platelets 150 - 450 x10E3/uL 199 188 432   CMP Latest Ref Rng & Units 01/29/2020 06/15/2019 06/14/2019  Glucose 65 - 99 mg/dL 90 84 95  BUN 8 - 27 mg/dL 15 5(L) <1(S)  Creatinine 0.76 - 1.27 mg/dL 9.70 2.63 7.85  Sodium 134 - 144 mmol/L 143 143 140  Potassium 3.5 - 5.2 mmol/L 4.0 3.9 3.9  Chloride 96 - 106 mmol/L 101 97(L) 96(L)  CO2 20 - 29 mmol/L 32(H) 38(H) 38(H)  Calcium 8.6 - 10.2 mg/dL 9.3 8.8(F) 0.2(D)  Total Protein 6.5 - 8.1 g/dL - 5.8(L) -  Total Bilirubin 0.3 - 1.2 mg/dL - 0.7 -  Alkaline Phos 38 - 126 U/L - 60 -  AST 15 - 41 U/L - 18 -  ALT 0 - 44 U/L - 15 -    ___________________________________________ Radiologic studies:  CLINICAL DATA:  Current smoker, 95 pack-year history.   EXAM: CT CHEST WITHOUT CONTRAST LOW-DOSE FOR LUNG CANCER SCREENING    TECHNIQUE: Multidetector CT imaging of the chest was performed following the standard protocol without IV contrast.   COMPARISON:  01/07/2019.   FINDINGS: Cardiovascular: Atherosclerotic calcification of the aorta, aortic valve and coronary arteries. Pulmonic trunk is enlarged. Heart is at the upper limits of normal in size. No pericardial effusion.   Mediastinum/Nodes: Mediastinal lymph nodes are not enlarged by CT size criteria. Hilar regions are difficult to definitively evaluate without IV contrast. No axillary adenopathy. Esophagus is grossly unremarkable.   Lungs/Pleura: Centrilobular emphysema. Smoking related respiratory bronchiolitis. Peribronchial thickening. Calcified granulomas. Pulmonary nodules measure 6.0 mm or less in size, as before. Some nodules have resolved or are not readily visualized on the current study. No pleural fluid. Airway is unremarkable.   Upper Abdomen: Visualized portions of the liver, gallbladder, adrenal glands and right kidney are unremarkable. Tiny stone in the left kidney. Visualized portions of the spleen, pancreas, stomach and bowel are grossly unremarkable.  No upper abdominal adenopathy.   Musculoskeletal: No worrisome lytic or sclerotic lesions.   IMPRESSION: 1. Lung-RADS 2, benign appearance or behavior. Continue annual screening with low-dose chest CT without contrast in 12 months. 2. Tiny left renal stone. 3. Aortic atherosclerosis (ICD10-I70.0). Coronary artery calcification. 4. Enlarged pulmonic trunk, indicative of pulmonary arterial hypertension. 5.  Emphysema (ICD10-J43.9).     Electronically Signed   By: Leanna Battles M.D.   On: 01/18/2020 13:22  ____________________________________________ Other:   _____________________________________________ Assessment & Plan  Assessment: Encounter Diagnoses  Name Primary?  . Early satiety Yes  . Abdominal bloating   . Iron deficiency anemia due to chronic blood loss     Stable iron deficiency anemia of chronic occult GI blood loss from small bowel AVMs.  He needs long-term iron replacement and regular monitoring of his CBC.  (Last seen by hematology on 4 /01/2020, and if anemia worsens will need an appointment with them.  Ashby Dawes of his upper abdominal symptoms is unclear.  He looks well and otherwise feels well overall.  He has chronic dyspnea and occasional use of supplemental oxygen.  I wonder if some of it may be aerophagia due to his severe COPD.  He does not have sleep apnea/CPAP use.  Lung hyperinflation may be putting downward pressure on the diaphragm and some extrinsic gastric compression. Must consider intra-abdominal fluid collection, mass or less likely obstruction.  Plan: CT abdomen pelvis with oral and IV contrast  CBC and CMP today  31 minutes were spent on this encounter (including chart review, history/exam, counseling/coordination of care, and documentation) > 50% of that time was spent on counseling and coordination of care.  Topics discussed included: Upper abdominal discomfort, potential causes and plan for work-up.  Charlie Pitter III

## 2020-08-15 ENCOUNTER — Other Ambulatory Visit: Payer: Self-pay | Admitting: Family Medicine

## 2020-08-15 DIAGNOSIS — J449 Chronic obstructive pulmonary disease, unspecified: Secondary | ICD-10-CM

## 2020-08-15 MED FILL — BREZTRI AEROSPHERE 160-9-4.: 160-9-4.8 | 30 days supply | Qty: 11 | Fill #3

## 2020-08-15 MED FILL — ALBUTEROL SULFATE HFA 108 (: 108 (90 BAS | 25 days supply | Qty: 18 | Fill #0

## 2020-08-15 NOTE — Telephone Encounter (Signed)
Requested Prescriptions  Pending Prescriptions Disp Refills  . albuterol (VENTOLIN HFA) 108 (90 Base) MCG/ACT inhaler [Pharmacy Med Name: ALBUTEROL SULFATE HFA 108 ( 108 (90 BAS Aerosol] 18 g 5    Sig: INHALE 2 PUFFS INTO THE LUNGS EVERY 6 (SIX) HOURS AS NEEDED FOR WHEEZING OR SHORTNESS OF BREATH.     Pulmonology:  Beta Agonists Failed - 08/15/2020  9:16 AM      Failed - One inhaler should last at least one month. If the patient is requesting refills earlier, contact the patient to check for uncontrolled symptoms.      Passed - Valid encounter within last 12 months    Recent Outpatient Visits          2 months ago Impacted cerumen of right ear   [facility] [name], MD   1 year ago Need for vaccination for zoster   [facility] Lois Huxley, Cornelius Moras, RPH-CPP   1 year ago Chronic respiratory failure with hypoxia [facility])   [facility] [name], MD   1 year ago COPD GOLD III/still smoking    [facility] [place], Odette Horns, MD   1 year ago Hypertensive heart disease with chronic combined systolic and diastolic congestive heart failure [facility])    [facility] [name], MD      Future Appointments            In 1 month Wert, Charlaine Dalton, MD [facility]

## 2020-08-18 ENCOUNTER — Other Ambulatory Visit: Payer: Self-pay

## 2020-08-18 ENCOUNTER — Ambulatory Visit (HOSPITAL_COMMUNITY)
Admission: RE | Admit: 2020-08-18 | Discharge: 2020-08-18 | Disposition: A | Discharge status: Home / Self Care | Payer: Medicare HMO | Source: Ambulatory Visit | Attending: Gastroenterology | Admitting: Gastroenterology

## 2020-08-18 DIAGNOSIS — R6881 Early satiety: Secondary | ICD-10-CM | POA: Insufficient documentation

## 2020-08-18 DIAGNOSIS — R140 Abdominal distension (gaseous): Secondary | ICD-10-CM | POA: Diagnosis not present

## 2020-08-18 DIAGNOSIS — I700 Atherosclerosis of aorta: Secondary | ICD-10-CM | POA: Diagnosis not present

## 2020-08-18 DIAGNOSIS — D500 Iron deficiency anemia secondary to blood loss (chronic): Secondary | ICD-10-CM

## 2020-08-18 DIAGNOSIS — I7789 Other specified disorders of arteries and arterioles: Secondary | ICD-10-CM | POA: Diagnosis not present

## 2020-08-18 DIAGNOSIS — J439 Emphysema, unspecified: Secondary | ICD-10-CM | POA: Diagnosis not present

## 2020-08-18 DIAGNOSIS — I77811 Abdominal aortic ectasia: Secondary | ICD-10-CM | POA: Insufficient documentation

## 2020-08-18 DIAGNOSIS — N3289 Other specified disorders of bladder: Secondary | ICD-10-CM | POA: Diagnosis not present

## 2020-08-18 DIAGNOSIS — R109 Unspecified abdominal pain: Secondary | ICD-10-CM | POA: Insufficient documentation

## 2020-08-18 DIAGNOSIS — K7689 Other specified diseases of liver: Secondary | ICD-10-CM | POA: Diagnosis not present

## 2020-08-18 MED ORDER — IOHEXOL 300 MG/ML  SOLN
100.0000 mL | Freq: Once | INTRAMUSCULAR | Status: AC | PRN
  Administered 2020-08-18: 100 mL via INTRAVENOUS

## 2020-08-22 ENCOUNTER — Telehealth: Payer: Self-pay | Admitting: Gastroenterology

## 2020-08-22 NOTE — Telephone Encounter (Signed)
Lm on vm for patient to return call 

## 2020-08-22 NOTE — Telephone Encounter (Signed)
Nothing on CT to explain symptoms. No fluid collection, enlarged lymph nodes, tumors or obstruction.  As we had discussed in the office, I think this is related to hyperinflated lungs from his COPD.  I understand that the symptoms are bothersome to him, but there does not appear to be anything harmful causing him.  I am afraid I do not have any particular medicines that will be helpful for it.  Hopefully the normal CT scan is reassuring.  - HD

## 2020-08-22 NOTE — Telephone Encounter (Signed)
Patient called about imaging results, please call patient when they are ready.

## 2020-08-22 NOTE — Telephone Encounter (Signed)
Spoke with patient in regards to his CT scan results. Patient verbalized understanding and had no concerns at the end of the call.

## 2020-09-08 ENCOUNTER — Other Ambulatory Visit: Payer: Self-pay | Admitting: Family Medicine

## 2020-09-08 MED FILL — DULoxetine HCL 60 MG CPEP: 60 | 30 days supply | Qty: 30 | Fill #0

## 2020-09-11 ENCOUNTER — Other Ambulatory Visit: Payer: Self-pay

## 2020-09-11 MED FILL — Budesonide-Glycopyrrolate-Formoterol Aers 160-9-4.8 MCG/ACT: RESPIRATORY_TRACT | 30 days supply | Qty: 10.7 | Fill #0 | Status: AC

## 2020-09-12 ENCOUNTER — Other Ambulatory Visit: Payer: Self-pay

## 2020-09-13 ENCOUNTER — Other Ambulatory Visit: Payer: Self-pay

## 2020-09-19 ENCOUNTER — Other Ambulatory Visit: Payer: Self-pay

## 2020-09-19 MED FILL — Gabapentin Cap 300 MG: ORAL | 30 days supply | Qty: 30 | Fill #0 | Status: AC

## 2020-09-20 ENCOUNTER — Other Ambulatory Visit: Payer: Self-pay

## 2020-10-03 ENCOUNTER — Ambulatory Visit: Payer: Medicare HMO | Admitting: Internal Medicine

## 2020-10-04 ENCOUNTER — Other Ambulatory Visit: Payer: Self-pay

## 2020-10-04 ENCOUNTER — Encounter: Payer: Self-pay | Admitting: Internal Medicine

## 2020-10-04 ENCOUNTER — Ambulatory Visit (INDEPENDENT_AMBULATORY_CARE_PROVIDER_SITE_OTHER): Payer: Medicare HMO | Admitting: Internal Medicine

## 2020-10-04 DIAGNOSIS — J9612 Chronic respiratory failure with hypercapnia: Secondary | ICD-10-CM

## 2020-10-04 DIAGNOSIS — F17210 Nicotine dependence, cigarettes, uncomplicated: Secondary | ICD-10-CM

## 2020-10-04 DIAGNOSIS — J449 Chronic obstructive pulmonary disease, unspecified: Secondary | ICD-10-CM | POA: Diagnosis not present

## 2020-10-04 DIAGNOSIS — J9611 Chronic respiratory failure with hypoxia: Secondary | ICD-10-CM | POA: Diagnosis not present

## 2020-10-04 MED ORDER — PREDNISONE 10 MG PO TABS
ORAL_TABLET | ORAL | 0 refills | Status: DC
  Filled 2020-10-04: qty 14, 6d supply, fill #0

## 2020-10-04 MED ORDER — AZITHROMYCIN 250 MG PO TABS
ORAL_TABLET | ORAL | 0 refills | Status: DC
  Filled 2020-10-04: qty 6, 5d supply, fill #0

## 2020-10-04 NOTE — Progress Notes (Signed)
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 He also reports dyspnea on exertion. In addition, he reports epigastric abdominal pain, which may radiate to the lower portion of his sternum versus chest pain . He denies any lower extremity edema. Admits to salt rich food ingestion .The symptoms are similar to those of one week ago, at which time, workup was suspicious for acute exacerbation of systolic heart failure. She was started on Cozaar, metoprolol and Lasix 20 mg daily at the time, and was recommended to see cardiology, which he failed to do so. His cardiologist is Dr. Dietrich Pates, last seen in December 2016.the patient continues to smoke 1 pack a day of cigarettes  Discharge Diagnoses:  Acute respiratory failure secondary to CHF exacerbation -O2 sats upon admission 89% -CXR unremarkable for infection -Responded well to IVlasix-->po lasix -Weaned off of O2-SpO2 95% on room air  Acute combined systolic/diastolic heart failure -BNP 2056.8 -Echocardiogram 09/20/2014 showed EF 40-45% -Transition to by mouth Lasix 20 mg daily -Echocardiogram 01/10/2016: EF 35-40%, diastolic dysfunction --AST 147, ALT 110 upon admission -Currently trending downward -hepatitis panel unremarkable -Patient denies drug/alcohol  use -switch metoprolol-->coreg -continue losartan  Essential Hypertension -Has not been taking home medications for over a year due to lack of insurance and monetary funds -Restarted on losartan, Lasix --switch metoprolol-->coreg 6.25 mg bid  CAD -Currently no chest pain -Continue coreg, losartan, ASA 81 mg daily -LDL 80  Tobacco Abuse -Smoking cessation discussed -Continue nicotine patch  Moderate malnutrition -Nutrition consult   History of Present Illness  02/29/2016 1st [facility] office visit/ Alexina Niccoli  symb 160 2bid/ bid saba (not prn)  Chief Complaint  Patient presents with  . Pulmonary Consult    Pt referred by Tereso Newcomer, PA, for cough, hemoptysis, and emphysema. Pt c/o hemoptysis x 3 weeks, pt states this occurs daily but has been improving. Pt c/o increase in SOB with activity and rest.   acute  onset epistaxis/ hemoptyis end of Auguest > ER eval 02/20/16 dx RLL pna by CTa chest > rx levaquin/ prednisone and minimal hemoptysis now  Doe = MMRC2 = can't walk a nl pace on a flat grade s sob but does fine slow and flat eg [employer] shopping rec Plan A = Automatic =  Symbicort 160 Take 2 puffs first thing in am and then another 2 puffs about 12 hours later.  Work on inhaler technique: Plan B = Backup Only use your albuterol as a rescue medication\ Plan C = Crisis - only use your albuterol nebulizer if you first try Plan B and it fails to help > ok to use the nebulizer up to every 4 hours but if start needing it regularly call for immediate appointment  The key is to stop smoking completely before smoking completely stops you!  Please schedule a follow up office visit  in 2 weeks, sooner if needed with cxr on return > did not return   Admit date: 05/04/2016 Discharge date: 05/09/2016    DISCHARGE DIAGNOSES:  Principal Problem:   Hemoptysis Active Problems:   Community acquired pneumonia of right lower lobe of lung (HCC) - - FOB RLL tbbx ATYPICAL PNEUMOCYTES  PROLIFERATION/ LLL BAL 05/08/16    Hypertension   COPD with acute exacerbation (HCC)   Nonischemic dilated cardiomyopathy (HCC)   Cigarette smoker   Chronic systolic CHF (congestive heart failure) (HCC)   06/03/16 to ER> only better while on neb    06/26/2016  f/u ov/Jenni Thew re:  GOLD III copd / maint symb 160 / saba  Chief Complaint  Patient presents with  . Follow-up    pt had CT today. pt states breathing has worsen since last OV. pt c/o increased sob with exertion, prod cough with white mucus, wheezing & chest tightness,  breathing was worse w/in 2 days of last ov / then ran out of lasix one week ago and swelling started to get worse Also worse orthopnea ? Related to leg swelling in terms of timing but pt not sure  Doe now = MMRC3 = can't walk 100 yards even at a slow pace at a flat grade s stopping due to sob  rec Bisoprolol 5 mg one daily in place of the twice daily corevidol and resume your lasix and potassium  Plan A = Automatic = stop symbicort and start bevespi Take 2 puffs first thing in am and then another 2 puffs about 12 hours later.  Plan B = Backup Only use your albuterol as a rescue medication The key is to stop smoking completely before smoking completely stops you!  Please schedule a follow up office visit in 4 weeks, sooner if needed bring all meds      12/31/2018  f/u ov/Kasey Ewings re: re establish re copd now BREO ? Some bladder problems on LAMA?  Chief Complaint  Patient presents with  . Follow-up    Breathing seems slightly worse since the last visit. He is using his ventolin inhaler once per day and nebs about 2 x per day.   Dyspnea:  Worse for at least a year no better with 02/ some discomfort off 02 " like tightness "  Currently MMRC3 = can't walk 100 yards even at a slow pace at a flat grade s stopping due to sob even on 02 up to 4lpm  Cough: none  Sleeping: flat ok / no am cough /  SABA use: as above  Better p nebs/ over using  02: 3lpm 24/7  rec We need  you need you to monitor your 02 level to be sure it's above 90% Be sure protonix 40 mg Take 30-60 min before first meal of the day  We will ask [facility] to do BEST FIT analysis for longterm 02 The key is to stop smoking completely before smoking completely stops you! Add tudorza one puff twice daily  Only use your albuterol as a rescue medication  Only use nebulizer if you try the inhaler first  = change duoneb to pure albuterol 2.5 mg up to  every 4 hours is the max  Prednisone 10 mg take  4 each am x 2 days,   2 each am x 2 days,  1 each am x 2 days and stop  Please schedule a follow up office visit in 4 weeks, sooner if needed  with all medications /inhalers/ solutions in hand so  we can verify exactly what you are taking. This includes all medications from all doctors and over the counters Add: f/u sinus dz by CT         06/30/2019  f/u ov/Nataliya Graig re: GOLD III/ 02 dep but till smoking maint on tudorza one bid Chief Complaint  Patient presents with  . Follow-up    Breathing has improved some and he is using his albuterol inhaler 2 x per wk and neb about 3 x per wk.   Dyspnea: doe x room to room  Cough: none  Sleeping: lies flat / one pillow  SABA use: as above  02: 2lpm up to 3lpm  rec Plan A = Automatic = Always=    Anoro one click first thing in am, take two good drags  Plan B = Backup (to supplement plan A, not to replace it) Only use your albuterol inhaler as a rescue medication  Plan C = Crisis (instead of Plan B but only if Plan B stops working) - only use your albuterol nebulizer if you first try Plan B and it fails to help > ok to use the nebulizer up to every 4 hours but if start needing it regularly call for immediate appointment The key is to stop smoking completely before smoking completely stops you!   04/04/2020  f/u ov/Jadien Lehigh re:  GOLD III copd / 02 dep with ex and sleep  Chief Complaint  Patient presents with  . Follow-up    No compaints or concerns with breathing    Dyspnea:  Maybe two aisles at grocery store  Cough: mostly in am's > gray just in am  Sleeping: bed is flat / one pillow  SABA use: maybe once a day/ very rarely neb  02:  3lpm but thinks it comes off/ 3lpm pulsing am   - not using at rest = 95% rec Plan A = Automatic = Always=    Breztri Take 2 puffs first thing in am and then another 2 puffs about 12 hours later.  Work on inhaler technique: Plan B = Backup (to supplement plan A, not to replace it) Only use your albuterol inhaler as a rescue medication Plan C = Crisis (instead of Plan B but only if Plan B stops working) - only use your albuterol nebulizer if you first try Plan B  Rec: 3lpm 24/7 but increase to 4lpm pulsed when walking outside of house Make sure you check your oxygen saturations at highest level of activity    10/04/2020  f/u ov/Taiwo Fish re: GOLD III/ 02 dep with activity only  Chief Complaint  Patient presents with  . Follow-up    Increased sob x 1 wk., wheezing, cough-white, brown   Dyspnea: assoc with abd bloating with ct 08/18/20 of abd showed emphysematous changes on lung custs Cough: slt increase x one week with slt discolored mucus and subj wheeze? Pollen related every yer  Sleeping: flat bed one pillow SABA use: sev times a day, poor insight > not following ABC plan   02: 3lpm with activity  Covid status:   vax x 3/ pfizer    No obvious day to day or daytime variability or assoc excess  mucus plugs or hemoptysis or cp or chest tightness   or overt sinus or hb symptoms.   Sleeping as above without nocturnal  or early am exacerbation  of respiratory  c/o's or need for noct saba. Also denies any obvious fluctuation of symptoms with weather or environmental changes or other aggravating  or alleviating factors except as outlined above   No unusual exposure hx or h/o childhood pna/ asthma or knowledge of premature birth.  Current Allergies, Complete Past Medical History, Past Surgical History, Family History, and  Social History were reviewed in [employer] record.  ROS  The following are not active complaints unless bolded Hoarseness, sore throat, dysphagia, dental problems, itching, sneezing,  nasal congestion or discharge of excess mucus or purulent secretions, ear ache,   fever, chills, sweats, unintended wt loss or wt gain, classically pleuritic or exertional cp,  orthopnea pnd or arm/hand swelling  or leg swelling, presyncope, palpitations, abdominal pain/bloating , anorexia, nausea, vomiting, diarrhea  or change in bowel habits or change in bladder habits, change in stools or change in urine, dysuria, hematuria,  rash, arthralgias, visual complaints, headache, numbness, weakness or ataxia or problems with walking or coordination,  change in mood or  memory.        Current Meds  Medication Sig  . albuterol (PROVENTIL) (2.5 MG/3ML) 0.083% nebulizer solution Take 3 mLs (2.5 mg total) by nebulization every 4 (four) hours as needed for wheezing or shortness of breath.  Marland Kitchen albuterol (VENTOLIN HFA) 108 (90 Base) MCG/ACT inhaler INHALE 2 PUFFS INTO THE LUNGS EVERY 6 (SIX) HOURS AS NEEDED FOR WHEEZING OR SHORTNESS OF BREATH.  Marland Kitchen atorvastatin (LIPITOR) 80 MG tablet TAKE 1 TABLET EVERY DAY  . Budeson-Glycopyrrol-Formoterol 160-9-4.8 MCG/ACT AERO INHALE 2 PUFFS INTO THE LUNGS 2 (TWO) TIMES DAILY.  . carvedilol (COREG) 3.125 MG tablet TAKE 1 TABLET (3.125 MG TOTAL) BY MOUTH 2 (TWO) TIMES DAILY.  . DULoxetine (CYMBALTA) 60 MG capsule TAKE 1 CAPSULE (60 MG TOTAL) BY MOUTH DAILY. FOR PARESTHESIA  . ferrous sulfate 324 MG TBEC Take 1 tablet (324 mg total) by mouth daily.  . furosemide (LASIX) 40 MG tablet TAKE 1 TABLET EVERY DAY  . gabapentin (NEURONTIN) 300 MG capsule TAKE 1 CAPSULE (300 MG TOTAL) BY MOUTH AT BEDTIME.  . Misc. Devices MISC Portable oxygen concentrator. Diagnosis COPD.  . nitroGLYCERIN (NITROSTAT) 0.4 MG SL tablet PLACE 1 TABLET (0.4 MG TOTAL) UNDER THE TONGUE EVERY 5 (FIVE)  MINUTES AS NEEDED FOR CHEST PAIN. CALL 911 IF SYMPTOMS DO NOT RESOLVE AFTER 3 MINUTES  . pantoprazole (PROTONIX) 40 MG tablet Take 1 tablet (40 mg total) by mouth 2 (two) times daily before a meal.  . XARELTO 20 MG TABS tablet TAKE 1 TABLET EVERY DAY  WITH  SUPPER                 Objective:   Physical Exam     10/04/2020      217 04/04/2020    212 06/30/2019      189  01/28/2019      191 12/31/2018      189  10/22/2016      190  07/24/2016        209  06/26/2016        213  05/28/2016      194   02/29/16 177 lb 12.8 oz (80.6 kg)  02/24/16 182 lb 12.8 oz (82.9 kg)  02/20/16 175 lb (79.4 kg)      Vital signs reviewed  10/04/2020  - Note   02 sats  94% on 3lpm and 90% on RA both values at rest    General appearance:    Chronically ill elderly amb bm easily confused with details of care    HEENT : pt wearing mask not removed for exam due  to covid -19 concerns.    NECK :  without JVD/Nodes/TM/ nl carotid upstrokes bilaterally   LUNGS: no acc muscle use,  Mod barrel  contour chest wall with bilateral  Distant bs s audible wheeze and  without cough on insp or exp maneuvers and mod  Hyperresonant  to  percussion bilaterally     CV:  RRR  no s3 or murmur or increase in P2, and no edema   ABD:  soft and nontender with pos mid insp Hoover's  in the supine position. No bruits or organomegaly appreciated, bowel sounds nl  MS:     ext warm without deformities, calf tenderness, cyanosis or clubbing No obvious joint restrictions   SKIN: warm and dry without lesions    NEURO:  alert, approp, nl sensorium with  no motor or cerebellar deficits apparent.                 Assessment & Plan:

## 2020-10-04 NOTE — Assessment & Plan Note (Signed)
Active smoker 02/29/2016  After extensive coaching HFA effectiveness =    75% > continue symbicort  - Spirometry 05/28/2016  FEV1 1.48 (40%)  Ratio 46  p am symbicort 160 2bid  - 06/26/2016  After extensive coaching HFA effectiveness =    90% > bevespi 2 bid and change coreg to bisoprolol 5 mg daily > not done as of 07/24/2016 > at ov 10/22/2016 off coreg and all inhalers and much better sob but still some cough on entresto maint - Spirometry 10/22/2016  FEV1 1.24 (35%)  Ratio 48 - 12/31/2018    tudorza rx one puff bid  - 01/28/2019  After extensive coaching inhaler device,  effectiveness =    90% with tudorza  03/09/2019 Reports improvement with [name] but not covered, refer to [facility]. May need PA processed- he has been on bevespi and spiriva in the past  - 06/30/2019  After extensive coaching inhaler device,  effectiveness =    90% with elipta try anoro one each am  PFT's  04/04/2020  FEV1 1.25 (35 % ) ratio 0.41  p 6 % improvement from saba p tudorza prior to study with DLCO  8.26 (27%) corrects to 1.38 (34%)  for alv volume and FV curve classic concave pattern  - 04/04/2020  After extensive coaching inhaler device,  effectiveness = 90%    > breztri 2bid    Group D in terms of symptom/risk and laba/lama/ICS  therefore appropriate rx at this point >>>  Had been doing well until flare ? CB from smoking vs allergy mediated > rx pred/ zpak and f/u in 6 m as long as resumes previous baseline and need for saba

## 2020-10-04 NOTE — Assessment & Plan Note (Signed)
Counseled re importance of smoking cessation but did not meet time criteria for separate billing            Each maintenance medication was reviewed in detail including emphasizing most importantly the difference between maintenance and prns and under what circumstances the prns are to be triggered using an action plan format where appropriate.  Total time for H and P, chart review, counseling, reviewing 02/hfa device(s) and generating customized AVS unique to this office visit / same day charting > 30 min   

## 2020-10-04 NOTE — Assessment & Plan Note (Signed)
See copd - 10/22/2016  Walked RA x 3 laps @ 185 ft each stopped due to  End of study moderate pace, no desat  - HCO03  11/11/18  = 41  - 12/31/2018   Walked 2lpm   2 laps @  approx 26ft each @ avg pace  stopped due to  Sob on 2lpm but no desats   - 01/28/2019   Walked RA x one lap =  approx 250 ft - stopped due to  Sob with sats ok on 3lpm POC > referred for POC  - HCO3  06/15/19 = 38  -  04/04/2020   Walked 3lpm  pulsed  3laps @ approx 268ft each @avg  pace  stopped due desats p second lap to 87% corrected on 4lpm pulsed   With sats 98% at end   Reminded again: Make sure you check your oxygen saturation  at your highest level of activity  to be sure it stays over 90% and adjust  02 flow upward to maintain this level if needed but remember to turn it back to previous settings when you stop (to conserve your supply).

## 2020-10-04 NOTE — Patient Instructions (Signed)
Plan A = Automatic = Always=    Breztri Take 2 puffs first thing in am and then another 2 puffs about 12 hours later.   Work on inhaler technique:  relax and gently blow all the way out then take a nice smooth deep breath back in, triggering the inhaler at same time you start breathing in.  Hold for up to 5 seconds if you can. Blow out thru nose. Rinse and gargle with water when done   Plan B = Backup (to supplement plan A, not to replace it) Only use your albuterol inhaler as a rescue medication to be used if you can't catch your breath by resting or doing a relaxed purse lip breathing pattern.  - The less you use it, the better it will work when you need it. - Ok to use the inhaler up to 2 puffs  every 4 hours if you must but call for appointment if use goes up over your usual need - Don't leave home without it !!  (think of it like the spare tire for your car)   Plan C = Crisis (instead of Plan B but only if Plan B stops working) - only use your albuterol nebulizer if you first try Plan B and it fails to help > ok to use the nebulizer up to every 4 hours but if start needing it regularly call for immediate appointment   Make sure you check your oxygen saturations at highest level of activity to be sure it stays over 90% and adjust  02 flow upward to maintain this level if needed but remember to turn it back to previous settings when you stop (to conserve your supply).   The key is to stop smoking completely before smoking completely stops you!  zpak  Prednisone 10 mg take  4 each am x 2 days,   2 each am x 2 days,  1 each am x 2 days and stop    Please schedule a follow up visit in 6 months but call sooner if needed

## 2020-10-07 ENCOUNTER — Other Ambulatory Visit: Payer: Self-pay

## 2020-10-07 ENCOUNTER — Encounter (HOSPITAL_COMMUNITY): Payer: Self-pay | Admitting: *Deleted

## 2020-10-07 ENCOUNTER — Inpatient Hospital Stay (HOSPITAL_COMMUNITY)
Admission: EM | Admit: 2020-10-07 | Discharge: 2020-10-09 | DRG: 811 | Disposition: A | Discharge status: Home / Self Care | Payer: Medicare HMO | Attending: Student in an Organized Health Care Education/Training Program | Admitting: Student in an Organized Health Care Education/Training Program

## 2020-10-07 ENCOUNTER — Emergency Department (HOSPITAL_COMMUNITY): Payer: Medicare HMO

## 2020-10-07 DIAGNOSIS — D649 Anemia, unspecified: Secondary | ICD-10-CM

## 2020-10-07 DIAGNOSIS — J9621 Acute and chronic respiratory failure with hypoxia: Secondary | ICD-10-CM | POA: Diagnosis not present

## 2020-10-07 DIAGNOSIS — I428 Other cardiomyopathies: Secondary | ICD-10-CM | POA: Diagnosis present

## 2020-10-07 DIAGNOSIS — Z7901 Long term (current) use of anticoagulants: Secondary | ICD-10-CM | POA: Diagnosis not present

## 2020-10-07 DIAGNOSIS — Z8249 Family history of ischemic heart disease and other diseases of the circulatory system: Secondary | ICD-10-CM

## 2020-10-07 DIAGNOSIS — I5042 Chronic combined systolic (congestive) and diastolic (congestive) heart failure: Secondary | ICD-10-CM | POA: Diagnosis not present

## 2020-10-07 DIAGNOSIS — I110 Hypertensive heart disease with heart failure: Secondary | ICD-10-CM | POA: Diagnosis present

## 2020-10-07 DIAGNOSIS — I2510 Atherosclerotic heart disease of native coronary artery without angina pectoris: Secondary | ICD-10-CM

## 2020-10-07 DIAGNOSIS — K558 Other vascular disorders of intestine: Secondary | ICD-10-CM | POA: Diagnosis not present

## 2020-10-07 DIAGNOSIS — Z79899 Other long term (current) drug therapy: Secondary | ICD-10-CM

## 2020-10-07 DIAGNOSIS — K5521 Angiodysplasia of colon with hemorrhage: Secondary | ICD-10-CM | POA: Diagnosis present

## 2020-10-07 DIAGNOSIS — K31819 Angiodysplasia of stomach and duodenum without bleeding: Secondary | ICD-10-CM

## 2020-10-07 DIAGNOSIS — I480 Paroxysmal atrial fibrillation: Secondary | ICD-10-CM | POA: Diagnosis not present

## 2020-10-07 DIAGNOSIS — K922 Gastrointestinal hemorrhage, unspecified: Secondary | ICD-10-CM | POA: Diagnosis not present

## 2020-10-07 DIAGNOSIS — J449 Chronic obstructive pulmonary disease, unspecified: Secondary | ICD-10-CM

## 2020-10-07 DIAGNOSIS — J441 Chronic obstructive pulmonary disease with (acute) exacerbation: Secondary | ICD-10-CM | POA: Diagnosis present

## 2020-10-07 DIAGNOSIS — R55 Syncope and collapse: Secondary | ICD-10-CM | POA: Diagnosis not present

## 2020-10-07 DIAGNOSIS — D509 Iron deficiency anemia, unspecified: Principal | ICD-10-CM | POA: Diagnosis present

## 2020-10-07 DIAGNOSIS — Z8673 Personal history of transient ischemic attack (TIA), and cerebral infarction without residual deficits: Secondary | ICD-10-CM | POA: Diagnosis not present

## 2020-10-07 DIAGNOSIS — Z888 Allergy status to other drugs, medicaments and biological substances status: Secondary | ICD-10-CM

## 2020-10-07 DIAGNOSIS — F17210 Nicotine dependence, cigarettes, uncomplicated: Secondary | ICD-10-CM | POA: Diagnosis present

## 2020-10-07 DIAGNOSIS — I4892 Unspecified atrial flutter: Secondary | ICD-10-CM | POA: Diagnosis not present

## 2020-10-07 DIAGNOSIS — J9622 Acute and chronic respiratory failure with hypercapnia: Secondary | ICD-10-CM | POA: Diagnosis not present

## 2020-10-07 DIAGNOSIS — R519 Headache, unspecified: Secondary | ICD-10-CM | POA: Diagnosis present

## 2020-10-07 DIAGNOSIS — Z20822 Contact with and (suspected) exposure to covid-19: Secondary | ICD-10-CM | POA: Diagnosis present

## 2020-10-07 DIAGNOSIS — Z7951 Long term (current) use of inhaled steroids: Secondary | ICD-10-CM

## 2020-10-07 LAB — CBC WITH DIFFERENTIAL/PLATELET
Abs Immature Granulocytes: 0 10*3/uL (ref 0.00–0.07)
Basophils Absolute: 0 10*3/uL (ref 0.0–0.1)
Basophils Relative: 0 %
Eosinophils Absolute: 0.1 10*3/uL (ref 0.0–0.5)
Eosinophils Relative: 1 %
HCT: 31.1 % — ABNORMAL LOW (ref 39.0–52.0)
Hemoglobin: 7.6 g/dL — ABNORMAL LOW (ref 13.0–17.0)
Lymphocytes Relative: 21 %
Lymphs Abs: 2.6 10*3/uL (ref 0.7–4.0)
MCH: 19.8 pg — ABNORMAL LOW (ref 26.0–34.0)
MCHC: 24.4 g/dL — ABNORMAL LOW (ref 30.0–36.0)
MCV: 81.2 fL (ref 80.0–100.0)
Monocytes Absolute: 0.6 10*3/uL (ref 0.1–1.0)
Monocytes Relative: 5 %
Neutro Abs: 9 10*3/uL — ABNORMAL HIGH (ref 1.7–7.7)
Neutrophils Relative %: 73 %
Platelets: 295 10*3/uL (ref 150–400)
RBC: 3.83 MIL/uL — ABNORMAL LOW (ref 4.22–5.81)
RDW: 22.6 % — ABNORMAL HIGH (ref 11.5–15.5)
WBC: 12.3 10*3/uL — ABNORMAL HIGH (ref 4.0–10.5)
nRBC: 0.6 % — ABNORMAL HIGH (ref 0.0–0.2)
nRBC: 1 /100 WBC — ABNORMAL HIGH

## 2020-10-07 LAB — CBC
HCT: 32.2 % — ABNORMAL LOW (ref 39.0–52.0)
Hemoglobin: 8.2 g/dL — ABNORMAL LOW (ref 13.0–17.0)
MCH: 21 pg — ABNORMAL LOW (ref 26.0–34.0)
MCHC: 25.5 g/dL — ABNORMAL LOW (ref 30.0–36.0)
MCV: 82.6 fL (ref 80.0–100.0)
Platelets: 225 10*3/uL (ref 150–400)
RBC: 3.9 MIL/uL — ABNORMAL LOW (ref 4.22–5.81)
RDW: 21.9 % — ABNORMAL HIGH (ref 11.5–15.5)
WBC: 8.9 10*3/uL (ref 4.0–10.5)
nRBC: 0.7 % — ABNORMAL HIGH (ref 0.0–0.2)

## 2020-10-07 LAB — RETICULOCYTES
Immature Retic Fract: 38.7 % — ABNORMAL HIGH (ref 2.3–15.9)
RBC.: 3.83 MIL/uL — ABNORMAL LOW (ref 4.22–5.81)
Retic Count, Absolute: 164.3 10*3/uL (ref 19.0–186.0)
Retic Ct Pct: 4.3 % — ABNORMAL HIGH (ref 0.4–3.1)

## 2020-10-07 LAB — COMPREHENSIVE METABOLIC PANEL
ALT: 23 U/L (ref 0–44)
AST: 37 U/L (ref 15–41)
Albumin: 3.8 g/dL (ref 3.5–5.0)
Alkaline Phosphatase: 55 U/L (ref 38–126)
Anion gap: 10 (ref 5–15)
BUN: 13 mg/dL (ref 8–23)
CO2: 31 mmol/L (ref 22–32)
Calcium: 9.4 mg/dL (ref 8.9–10.3)
Chloride: 100 mmol/L (ref 98–111)
Creatinine, Ser: 0.99 mg/dL (ref 0.61–1.24)
GFR, Estimated: >60 mL/min (ref >60)
Glucose, Bld: 141 mg/dL — ABNORMAL HIGH (ref 70–99)
Potassium: 3.7 mmol/L (ref 3.5–5.1)
Sodium: 141 mmol/L (ref 135–145)
Total Bilirubin: 0.5 mg/dL (ref 0.3–1.2)
Total Protein: 6.9 g/dL (ref 6.5–8.1)

## 2020-10-07 LAB — IRON AND TIBC
Iron: 57 ug/dL (ref 45–182)
Saturation Ratios: 14 % — ABNORMAL LOW (ref 17.9–39.5)
TIBC: 393 ug/dL (ref 250–450)
UIBC: 336 ug/dL

## 2020-10-07 LAB — FERRITIN: Ferritin: 7 ng/mL — ABNORMAL LOW (ref 24–336)

## 2020-10-07 LAB — APTT: aPTT: 36 seconds (ref 24–36)

## 2020-10-07 LAB — PROTIME-INR
INR: 1.5 — ABNORMAL HIGH (ref 0.8–1.2)
Prothrombin Time: 18.1 seconds — ABNORMAL HIGH (ref 11.4–15.2)

## 2020-10-07 LAB — HIV ANTIBODY (ROUTINE TESTING W REFLEX): HIV Screen 4th Generation wRfx: NONREACTIVE

## 2020-10-07 LAB — TROPONIN I (HIGH SENSITIVITY)
Troponin I (High Sensitivity): 28 ng/L — ABNORMAL HIGH (ref <18)
Troponin I (High Sensitivity): 20 ng/L — ABNORMAL HIGH (ref <18)

## 2020-10-07 LAB — VITAMIN B12: Vitamin B-12: 1395 pg/mL — ABNORMAL HIGH (ref 180–914)

## 2020-10-07 LAB — PREPARE RBC (CROSSMATCH)

## 2020-10-07 LAB — SARS CORONAVIRUS 2 (TAT 6-24 HRS): SARS Coronavirus 2: NEGATIVE

## 2020-10-07 LAB — FOLATE: Folate: 45.6 ng/mL (ref >5.9)

## 2020-10-07 MED ORDER — BUDESON-GLYCOPYRROL-FORMOTEROL 160-9-4.8 MCG/ACT IN AERO: 2.0000 | INHALATION_SPRAY | Freq: Two times a day (BID) | RESPIRATORY_TRACT | Status: DC

## 2020-10-07 MED ORDER — GABAPENTIN 300 MG PO CAPS
300.0000 mg | ORAL_CAPSULE | Freq: Every day | ORAL | Status: DC
  Administered 2020-10-07 – 2020-10-09 (×3): 300 mg via ORAL
  Filled 2020-10-07 (×3): qty 1

## 2020-10-07 MED ORDER — AZITHROMYCIN 500 MG PO TABS
250.0000 mg | ORAL_TABLET | Freq: Every day | ORAL | Status: AC
  Administered 2020-10-07 – 2020-10-09 (×3): 250 mg via ORAL
  Filled 2020-10-07 (×4): qty 1

## 2020-10-07 MED ORDER — NICOTINE 14 MG/24HR TD PT24
14.0000 mg | MEDICATED_PATCH | Freq: Every day | TRANSDERMAL | Status: DC
  Administered 2020-10-08 – 2020-10-09 (×2): 14 mg via TRANSDERMAL
  Filled 2020-10-07 (×3): qty 1

## 2020-10-07 MED ORDER — ATORVASTATIN CALCIUM 80 MG PO TABS
80.0000 mg | ORAL_TABLET | Freq: Every day | ORAL | Status: DC
  Administered 2020-10-07 – 2020-10-09 (×3): 80 mg via ORAL
  Filled 2020-10-07: qty 2
  Filled 2020-10-07 (×2): qty 1

## 2020-10-07 MED ORDER — ALBUTEROL SULFATE HFA 108 (90 BASE) MCG/ACT IN AERS: 2.0000 | INHALATION_SPRAY | Freq: Four times a day (QID) | RESPIRATORY_TRACT | Status: DC | PRN

## 2020-10-07 MED ORDER — SODIUM CHLORIDE 0.9% IV SOLUTION: Freq: Once | INTRAVENOUS | Status: DC

## 2020-10-07 MED ORDER — PANTOPRAZOLE SODIUM 40 MG IV SOLR
40.0000 mg | Freq: Two times a day (BID) | INTRAVENOUS | Status: DC
  Administered 2020-10-07 – 2020-10-08 (×4): 40 mg via INTRAVENOUS
  Filled 2020-10-07 (×4): qty 40

## 2020-10-07 MED ORDER — UMECLIDINIUM BROMIDE 62.5 MCG/INH IN AEPB
1.0000 | INHALATION_SPRAY | Freq: Every day | RESPIRATORY_TRACT | Status: DC
  Administered 2020-10-08 – 2020-10-09 (×2): 1 via RESPIRATORY_TRACT
  Filled 2020-10-07: qty 7

## 2020-10-07 MED ORDER — CARVEDILOL 3.125 MG PO TABS
3.1250 mg | ORAL_TABLET | Freq: Two times a day (BID) | ORAL | Status: DC
  Administered 2020-10-07 – 2020-10-08 (×4): 3.125 mg via ORAL
  Filled 2020-10-07 (×4): qty 1

## 2020-10-07 MED ORDER — PREDNISONE 10 MG PO TABS: 10.0000 mg | ORAL_TABLET | Freq: Every day | ORAL | Status: DC

## 2020-10-07 MED ORDER — MOMETASONE FURO-FORMOTEROL FUM 200-5 MCG/ACT IN AERO
2.0000 | INHALATION_SPRAY | Freq: Two times a day (BID) | RESPIRATORY_TRACT | Status: DC
  Administered 2020-10-07 – 2020-10-08 (×3): 2 via RESPIRATORY_TRACT
  Filled 2020-10-07: qty 8.8

## 2020-10-07 MED ORDER — PREDNISONE 20 MG PO TABS: 10.0000 mg | ORAL_TABLET | ORAL | Status: DC

## 2020-10-07 MED ORDER — ALBUTEROL SULFATE (2.5 MG/3ML) 0.083% IN NEBU
2.5000 mg | INHALATION_SOLUTION | RESPIRATORY_TRACT | Status: DC | PRN
  Filled 2020-10-07: qty 3

## 2020-10-07 MED ORDER — PREDNISONE 20 MG PO TABS
20.0000 mg | ORAL_TABLET | Freq: Every day | ORAL | Status: AC
  Administered 2020-10-07 – 2020-10-08 (×2): 20 mg via ORAL
  Filled 2020-10-07 (×2): qty 1

## 2020-10-07 MED ORDER — NITROGLYCERIN 0.4 MG SL SUBL
0.4000 mg | SUBLINGUAL_TABLET | SUBLINGUAL | Status: DC | PRN
  Administered 2020-10-07 (×2): 0.4 mg via SUBLINGUAL
  Filled 2020-10-07 (×2): qty 1

## 2020-10-07 NOTE — ED Triage Notes (Signed)
States he was fine this am went to the bathroom and was sitting on the commode and passed out. . Denies injury or chest pain

## 2020-10-07 NOTE — ED Notes (Signed)
Extra gold tops sent to lab.

## 2020-10-07 NOTE — ED Provider Notes (Signed)
[name] Provider Note   CSN: [id number] Arrival date & time: 10/07/20  0850     History Chief Complaint  Patient presents with  . Near Syncope    [name] is a 65 y.o. male presenting for evaluation after syncopal event.  Patient states he was on the toilet today, not straining, when he had a syncopal event.  He states he fell to the ground, but did not hit his head.  He denies injury from the fall.  He had similar episode 1 to 2 weeks ago.  Several years ago he had issues with syncope, at that time he was found to be anemic and required blood transfusion.  There is no identifiable viable cause for bleeding at that time.  He remains on a blood thinner due to A. fib.  He denies chest pain, shortness of breath, nausea, vomiting, or feeling ill prior to or after syncope.  He is now feeling well.  No dizziness, lightheadedness, headache, neck or back pain.  No change in urination or bowel movements recently.  He denies melena or hematochezia.  Patient states he was started on prednisone and a Z-Pak for his breathing recently, no other medication changes.  Additional history from chart review.  History of COPD and uses oxygen intermittently, symptomatic anemia, CAD, hypertension, cardiomyopathy, CHF, a flutter on Xarelto.  Per chart review, patient has had recurring GI bleeds.  HPI     Past Medical History:  Diagnosis Date  . AVM (arteriovenous malformation)   . CAD (coronary artery disease)    a. LHC 5/12:  LAD 20, pLCx 20, pRCA 40, dRCA 40, EF 35%, diff HK  //  b. Myoview 4/16: Overall Impression: High risk stress nuclear study There is no evidence of ischemia. There is severe LV dysfunction. LV Ejection Fraction: 30%. LV Wall Motion: There is global LV hypokinesis.   Marland Kitchen CAP (community acquired pneumonia) 09/2013  . Chronic combined systolic and diastolic CHF (congestive heart failure) (HCC)    a. Echo 4/16:Mild LVH, EF 40-45%, diffuse HK  //  b. Echo 8/17: EF 35-40%, diffuse HK, diastolic dysfunction, aortic sclerosis, trivial MR, moderate LAE, normal RVSF, moderate RAE, mild TR, PASP 42 mmHg // c. Echo 4/18: Mild concentric LVH, EF 30-35, normal wall motion, grade 1 diastolic dysfunction, PASP 49  . Chronic respiratory failure (HCC)   . Cluster headache    "hx; haven't had one in awhile" (01/09/2016)  . COPD (chronic obstructive pulmonary disease) (HCC)    [name] 01/09/2016  . History of CVA (cerebrovascular accident)   . Hypertension   . IDA (iron deficiency anemia)   . Moderate tobacco use disorder   . NICM (nonischemic cardiomyopathy) (HCC)   . Nicotine addiction   . Tobacco abuse     Patient Active Problem List   Diagnosis Date Noted  . Acute GI bleeding 06/11/2019  . Syncope 06/11/2019  . Chronic right maxillary sinusitis 01/29/2019  . Chronic respiratory failure with hypoxia and hypercapnia (HCC) 01/01/2019  . Acute on chronic respiratory failure with hypoxia and hypercapnia (HCC) 11/10/2018  . GI bleed 10/27/2018  . Chronic anticoagulation 09/27/2018  . AV malformation of gastrointestinal tract   . Occult GI bleeding   . Heme positive stool   . Iron deficiency anemia   . Symptomatic anemia 09/04/2018  . Noncompliance 06/14/2017  . Elevated LFTs 08/13/2016  . Chronic respiratory failure with hypoxia (HCC) 07/24/2016  . Atrial flutter (HCC)   . Hepatic congestion 07/02/2016  .  Chronic combined systolic and diastolic CHF (congestive heart failure) (HCC) 07/01/2016  . COPD with acute exacerbation (HCC) 06/03/2016  . Prediabetes 05/14/2016  . Hemoptysis 05/06/2016  . COPD GOLD III/still smoking  02/29/2016  . Cigarette smoker 01/09/2016  . CAD (coronary artery disease) 10/07/2013  . 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 Centrilobular emphysema (HCC) 10/04/2013  . Essential hypertension     Past Surgical History:  Procedure Laterality Date  . CARDIAC CATHETERIZATION  10/2010   LM  normal, LAD with 20% irregularities, LCX with 20%, RCA with 40% prox and 40% distal - EF of 35%  . CATARACT EXTRACTION, BILATERAL    . COLONOSCOPY W/ BIOPSIES AND POLYPECTOMY    . COLONOSCOPY WITH PROPOFOL N/A 09/06/2018   Procedure: COLONOSCOPY WITH PROPOFOL;  Surgeon: Tressia DanasBeavers, Kimberly, MD;  [facility];  Service: Gastroenterology;  Laterality: N/A;  . ENTEROSCOPY N/A 09/28/2018   Procedure: ENTEROSCOPY;  Surgeon: Jeani HawkingHung, Patrick, MD;  [facility];  Service: Endoscopy;  Laterality: N/A;  . ENTEROSCOPY N/A 10/28/2018   Procedure: ENTEROSCOPY;  Surgeon: Tressia DanasBeavers, Kimberly, MD;  [facility];  Service: Gastroenterology;  Laterality: N/A;  . ESOPHAGOGASTRODUODENOSCOPY (EGD) WITH PROPOFOL N/A 09/05/2018   Procedure: ESOPHAGOGASTRODUODENOSCOPY (EGD) WITH PROPOFOL;  Surgeon: Benancio DeedsArmbruster, Steven P, MD;  [facility];  Service: Gastroenterology;  Laterality: N/A;  . EXCISION MASS HEAD    . GIVENS CAPSULE STUDY N/A 09/06/2018   Procedure: GIVENS CAPSULE STUDY;  Surgeon: Tressia DanasBeavers, Kimberly, MD;  [facility];  Service: Gastroenterology;  Laterality: N/A;  . GIVENS CAPSULE STUDY N/A 09/26/2018   Procedure: GIVENS CAPSULE STUDY;  Surgeon: Beverley FiedlerPyrtle, Jay M, MD;  [facility];  Service: Gastroenterology;  Laterality: N/A;  . GIVENS CAPSULE STUDY N/A 06/14/2019   Procedure: GIVENS CAPSULE STUDY;  Surgeon: Napoleon FormNandigam, Kavitha V, MD;  Location: [facility] ENDOSCOPY;  Service: Endoscopy;  Laterality: N/A;  . HOT HEMOSTASIS N/A 10/28/2018   Procedure: HOT HEMOSTASIS (ARGON PLASMA COAGULATION/BICAP);  Surgeon: Tressia DanasBeavers, Kimberly, MD;  [facility];  Service: Gastroenterology;  Laterality: N/A;  . INCISION AND DRAINAGE PERIRECTAL ABSCESS N/A 06/05/2017   Procedure: IRRIGATION AND DEBRIDEMENT PERIRECTAL ABSCESS;  Surgeon: Andria MeuseWhite, Christopher M, MD;  Location: [facility] OR;  Service: General;  Laterality: N/A;  . VIDEO BRONCHOSCOPY Bilateral 05/08/2016   Procedure: VIDEO  BRONCHOSCOPY WITH FLUORO;  Surgeon: Oretha Milchakesh V Alva, MD;  Location: [facility] ENDOSCOPY;  Service: Cardiopulmonary;  Laterality: Bilateral;       Family History  Problem Relation Age of Onset  . Heart disease Mother   . Diabetes Mother   . Colon cancer Mother   . Liver cancer Mother   . Cancer Father        type unknown  . Diabetes Sister        x 2  . Diabetes Brother     Social History   Tobacco Use  . Smoking status: Current Some Day Smoker    Packs/day: 2.00    Years: 47.00    Pack years: 94.00    Types: Cigarettes  . Smokeless tobacco: Never Used  Vaping Use  . Vaping Use: Never used  Substance Use Topics  . Alcohol use: Not Currently    Alcohol/week: 0.0 standard drinks    Comment: last drink was before xmas  . Drug use: No    Types: Cocaine, Marijuana    Comment: "nothing in 20 years"    Home Medications Prior to Admission medications   Medication Sig Start Date End Date Taking? Authorizing Provider  albuterol (  PROVENTIL) (2.5 MG/3ML) 0.083% nebulizer solution Take 3 mLs (2.5 mg total) by nebulization every 4 (four) hours as needed for wheezing or shortness of breath. 12/31/18   Nyoka Cowden, MD  albuterol (VENTOLIN HFA) 108 (90 Base) MCG/ACT inhaler INHALE 2 PUFFS INTO THE LUNGS EVERY 6 (SIX) HOURS AS NEEDED FOR WHEEZING OR SHORTNESS OF BREATH. 08/15/20 08/15/21  [name], MD  atorvastatin (LIPITOR) 80 MG tablet TAKE 1 TABLET EVERY DAY 08/03/20   [name], MD  azithromycin (ZITHROMAX) 250 MG tablet Take 2 on day one then 1 daily x 4 days 10/04/20   Nyoka Cowden, MD  Budeson-Glycopyrrol-Formoterol 160-9-4.8 MCG/ACT AERO INHALE 2 PUFFS INTO THE LUNGS 2 (TWO) TIMES DAILY. 04/27/20 04/27/21  Nyoka Cowden, MD  carvedilol (COREG) 3.125 MG tablet TAKE 1 TABLET (3.125 MG TOTAL) BY MOUTH 2 (TWO) TIMES DAILY. 06/14/20 06/14/21  [name], MD  DULoxetine (CYMBALTA) 60 MG capsule TAKE 1 CAPSULE (60 MG TOTAL) BY MOUTH DAILY. FOR PARESTHESIA 09/08/20 09/08/21  [name], MD  ferrous sulfate 324 MG TBEC Take 1 tablet (324 mg total) by mouth daily. 03/31/20   Pricilla Riffle, MD  fluticasone (FLONASE) 50 MCG/ACT nasal spray Place 1 spray into both nostrils daily for 30 days. 11/12/18 01/29/20  Burnadette Pop, MD  furosemide (LASIX) 40 MG tablet TAKE 1 TABLET EVERY DAY 08/03/20   [name], MD  gabapentin (NEURONTIN) 300 MG capsule TAKE 1 CAPSULE (300 MG TOTAL) BY MOUTH AT BEDTIME. 03/23/20 03/23/21  [name], MD  Misc. Devices MISC Portable oxygen concentrator. Diagnosis COPD. 12/05/18   [name], MD  nitroGLYCERIN (NITROSTAT) 0.4 MG SL tablet PLACE 1 TABLET (0.4 MG TOTAL) UNDER THE TONGUE EVERY 5 (FIVE) MINUTES AS NEEDED FOR CHEST PAIN. CALL 911 IF SYMPTOMS DO NOT RESOLVE AFTER 3 MINUTES 06/14/20 06/14/21  [name], MD  pantoprazole (PROTONIX) 40 MG tablet Take 1 tablet (40 mg total) by mouth 2 (two) times daily before a meal. 06/15/19   Sunnie Nielsen, DO  predniSONE (DELTASONE) 10 MG tablet Take  4 tablets by mouth every morning for 2 days, take 2 tabs every morning for 2 days,  then take 1 tab every morning for 2 days and stop 10/04/20   Nyoka Cowden, MD  XARELTO 20 MG TABS tablet TAKE 1 TABLET EVERY DAY  WITH  SUPPER Patient taking differently: Take 20 mg by mouth daily with supper. 08/03/20   [name], MD  TUDORZA PRESSAIR 400 MCG/ACT AEPB INHALE 1 PUFF INTO THE LUNGS IN THE MORNING AND AT BEDTIME. 03/29/20 04/04/20  Nyoka Cowden, MD    Allergies    Lisinopril  Review of Systems   Review of Systems  Neurological: Positive for syncope.  All other systems reviewed and are negative.   Physical Exam Updated Vital Signs BP 130/67   Pulse 87   Temp 97.8 F (36.6 C) (Oral)   Resp 19   Ht 6\' 3"  (1.905 m)   Wt 98.4 kg   SpO2 96%   BMI 27.12 kg/m   Physical Exam Vitals and nursing note reviewed.  Constitutional:      General: He is not in acute distress.    Appearance: He is well-developed.     Comments:  Appears nontoxic  HENT:     Head: Normocephalic and atraumatic.  Eyes:     Conjunctiva/sclera: Conjunctivae normal.     Pupils: Pupils are equal, round, and reactive to light.  Cardiovascular:     Rate and Rhythm:  Normal rate. Rhythm irregular.     Pulses: Normal pulses.     Comments: Irregularly irregular Pulmonary:     Effort: Pulmonary effort is normal. No respiratory distress.     Breath sounds: Normal breath sounds. No wheezing.  Abdominal:     General: There is no distension.     Palpations: Abdomen is soft. There is no mass.     Tenderness: There is no abdominal tenderness. There is no guarding or rebound.  Musculoskeletal:        General: Normal range of motion.     Cervical back: Normal range of motion and neck supple.  Skin:    General: Skin is warm and dry.     Capillary Refill: Capillary refill takes less than 2 seconds.     Coloration: Skin is pale.  Neurological:     Mental Status: He is alert and oriented to person, place, and time.     ED Results / Procedures / Treatments   Labs (all labs ordered are listed, but only abnormal results are displayed) Labs Reviewed  CBC WITH DIFFERENTIAL/PLATELET - Abnormal; Notable for the following components:      Result Value   WBC 12.3 (*)    RBC 3.83 (*)    Hemoglobin 7.6 (*)    HCT 31.1 (*)    MCH 19.8 (*)    MCHC 24.4 (*)    RDW 22.6 (*)    nRBC 0.6 (*)    Neutro Abs 9.0 (*)    nRBC 1 (*)    All other components within normal limits  COMPREHENSIVE METABOLIC PANEL - Abnormal; Notable for the following components:   Glucose, Bld 141 (*)    All other components within normal limits  TROPONIN I (HIGH SENSITIVITY) - Abnormal; Notable for the following components:   Troponin I (High Sensitivity) 28 (*)    All other components within normal limits  VITAMIN B12  FOLATE  IRON AND TIBC  FERRITIN  RETICULOCYTES  PROTIME-INR  APTT  FOLATE  POC OCCULT BLOOD, ED  TYPE AND SCREEN  PREPARE RBC (CROSSMATCH)     EKG EKG Interpretation  Date/Time:  Friday October 07 2020 09:00:35 EDT Ventricular Rate:  103 PR Interval:  124 QRS Duration: 102 QT Interval:  395 QTC Calculation: 486 R Axis:   -81 Text Interpretation: Sinus tachycardia Supraventricular bigeminy Abnormal R-wave progression, late transition Inferior infarct, old Confirmed by Kristine Royal [phone]) on 10/07/2020 9:02:32 AM   Radiology CT Head Wo Contrast  Result Date: 10/07/2020 CLINICAL DATA:  Syncopal episode using the restroom today. EXAM: CT HEAD WITHOUT CONTRAST TECHNIQUE: Contiguous axial images were obtained from the base of the skull through the vertex without intravenous contrast. COMPARISON:  Head CT 07/25/2016. FINDINGS: Brain: No evidence of acute infarction, hemorrhage, hydrocephalus, extra-axial collection or mass lesion/mass effect. Vascular: No hyperdense vessel or unexpected calcification. Skull: Intact.  No focal lesion. Sinuses/Orbits: Tiny mucous retention cyst or polyp left maxillary sinus noted. The patient is status post cataract surgery. Other: None. IMPRESSION: Negative head CT. Electronically Signed   By: Drusilla Kanner M.D.   On: 10/07/2020 09:52    Procedures .Critical Care Performed by: Alveria Apley, PA-C Authorized by: Alveria Apley, PA-C   Critical care provider statement:    Critical care time (minutes):  35   Critical care time was exclusive of:  Separately billable procedures and treating other patients and teaching time   Critical care was necessary to treat or prevent imminent or life-threatening deterioration of the  following conditions:  Circulatory failure   Critical care was time spent personally by me on the following activities:  Blood draw for specimens, development of treatment plan with patient or surrogate, evaluation of patient's response to treatment, examination of patient, obtaining history from patient or surrogate, ordering and performing treatments and interventions,  ordering and review of laboratory studies, ordering and review of radiographic studies, pulse oximetry, re-evaluation of patient's condition and review of old charts   I assumed direction of critical care for this patient from another provider in my specialty: no     Care discussed with: admitting provider   Comments:     Pt with acute on chronic anemia requiring blood transfusion.      Medications Ordered in ED Medications  0.9 %  sodium chloride infusion (Manually program via Guardrails IV Fluids) (has no administration in time range)    ED Course  I have reviewed the triage vital signs and the nursing notes.  Pertinent labs & imaging results that were available during my care of the patient were reviewed by me and considered in my medical decision making (see chart for details).    MDM Rules/Calculators/A&P                          Patient presenting for evaluation of syncope.  On exam, patient appears nontoxic, though he is slightly pale.  He did arrive tachycardic with an irregular rhythm.  He has a history of A. fib/flutter.  He is on a blood thinner for this.  Event occurred while on the toilet, consider vagal.  However patient with a history of anemia requiring transfusion, consider anemia.  Consider infection.  Consider syncope due to cardiac arrhythmia.  Will obtain labs, EKG, CT head and continue to monitor.  Labs show acute on chronic anemia with a hemoglobin of 7.6, it was ten 1 month ago.  Anemia panel ordered. He does have some orthostatic changes.  As patient has symptomatic anemia with syncope, will plan to [facility] to [name] and transfuse blood.  Discussed risks and benefits with patient, he would like the blood transfusion.  Patient declined Hemoccult or rectal exam.  Labs are overall otherwise reassuring.  Will call for admission.  Discussed with Dr. Chesley Mires from internal medicine teaching service, patient to be admitted  Final Clinical Impression(s) / ED  Diagnoses Final diagnoses:  Acute on chronic anemia  Syncope, unspecified syncope type    Rx / DC Orders ED Discharge Orders    None       Alveria Apley, PA-C 10/07/20 1028    Wynetta Fines, MD 10/08/20 1418

## 2020-10-07 NOTE — ED Notes (Addendum)
Pt c/o sudden onset L sided CP, hx of same. Pain 5/10. EKG obtained. Pt given 0.4 mg SL NTG per prn order. MD notified.

## 2020-10-07 NOTE — ED Notes (Signed)
Blood transfusion consent obtained

## 2020-10-07 NOTE — ED Notes (Signed)
Patient transported to CT 

## 2020-10-07 NOTE — Consult Note (Addendum)
Consultation  Referring Provider: Dr. Antony Contras      Primary Care Physician:  [name], MD Primary Gastroenterologist:  Dr. Myrtie Neither       Reason for Consultation: Anemia              HPI:   Jared [name] is a 65 y.o. male with a past medical history as listed below including CAD, chronic combined systolic and diastolic CHF, COPD on oxygen intermittently, IDA and others, who presented to the hospital on 10/07/2020 with a near syncopal event.    Today, the patient describes that he was sitting on the toilet "not straining" and he passed out.  He fell to the ground but did not hit his head.  Apparently had a similar episode about 1 to 2 weeks ago.  Describes that several years ago he had issues with this and at that time was found to be anemic and required blood transfusion.  He is currently on Xarelto for A. fib, his last dose was yesterday evening around 9 PM.  Tells me his stool is always dark as he is on oral iron every day.  Tells me he does not follow with anyone directly in regards to his anemia.    Does discuss that he was recently started on Prednisone and a Z-Pak for his breathing, but had only taken 2 days of this medication.  Apparently was only supposed to be on it for short course of about 4 days.  Was not sure if it was really helping it.    Also admits to ibuprofen 2-3 times a week.    Denies fever, chills, weight loss, change in bowel habits, abdominal pain, nausea, vomiting or heartburn or reflux.  ED course: Hemoglobin 7.6 (10.4 a month ago), WBC 12.3, CMP within normal BUN, INR 1.5; negative CT of the head  GI history: 08/18/2020 CT abdomen pelvis with contrast with no findings to explain the patient's clinical history, emphysema, aortic atherosclerosis; Dr. Myrtie Neither explained that likely the symptoms were due to hyperinflated lungs from his COPD, but there was nothing that he could do about this  Office visit 08/09/2020 with Dr. 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, despite extensive work-up, this problem remained complex and most likely cannot be definitively solved endoscopically, small bowel AVMs typically evanescent, making them difficult to diagnose and treat endoscopically, particularly when they are in the length of the small bowel, at that point patient was referred to hematology for close monitoring of hemoglobin and iron levels with periodic IV iron treatments, it was suspected that he would be more successful on oral iron and keeping his hemoglobin in a safer range, improving his functional status and trying to keep him out of the hospital, it was discussed that if above was done and he still had severe anemia then strong consideration should be given to permanently discontinuing his oral anticoagulation"  06/14/2019 capsule  endoscopy: Complete capsule endoscopy with findings of gastric erythema/gastritis, no AVMs visualized, no active bleeding 10/28/2018 small bowel enteroscopy: Small AVM ablated, otherwise normal 09/28/2018 small bowel enteroscopy: Normal 09/26/2018 capsule endoscopy: Active bleeding 1 minute after entry into the duodenum, enteroscopy scheduled the next day  Past Medical History:  Diagnosis Date  . AVM (arteriovenous malformation)   . CAD (coronary artery disease)    a. LHC 5/12:  LAD 20, pLCx 20, pRCA 40, dRCA 40, EF 35%, diff HK  //  b. Myoview 4/16: Overall  Impression: High risk stress nuclear study There is no evidence of ischemia. There is severe LV dysfunction. LV Ejection Fraction: 30%. LV Wall Motion: There is global LV hypokinesis.   Marland Kitchen. CAP (community acquired pneumonia) 09/2013  . Chronic combined systolic and diastolic CHF (congestive heart failure) (HCC)    a. Echo 4/16:Mild LVH, EF 40-45%, diffuse HK //  b. Echo 8/17: EF 35-40%, diffuse HK, diastolic dysfunction, aortic sclerosis, trivial MR, moderate LAE, normal RVSF, moderate RAE, mild TR, PASP 42 mmHg // c. Echo 4/18: Mild concentric LVH, EF 30-35, normal wall motion, grade 1 diastolic dysfunction, PASP 49  . Chronic respiratory failure (HCC)   . Cluster headache    "hx; haven't had one in awhile" (01/09/2016)  . COPD (chronic obstructive pulmonary disease) (HCC)    [name]/notes 01/09/2016  . History of CVA (cerebrovascular accident)   . Hypertension   . IDA (iron deficiency anemia)   . Moderate tobacco use disorder   . NICM (nonischemic cardiomyopathy) (HCC)   . Nicotine addiction   . Tobacco abuse     Past Surgical History:  Procedure Laterality Date  . CARDIAC CATHETERIZATION  10/2010   LM normal, LAD with 20% irregularities, LCX with 20%, RCA with 40% prox and 40% distal - EF of 35%  . CATARACT EXTRACTION, BILATERAL    . COLONOSCOPY W/ BIOPSIES AND POLYPECTOMY    . COLONOSCOPY WITH PROPOFOL N/A 09/06/2018   Procedure: COLONOSCOPY WITH PROPOFOL;  Surgeon: Tressia DanasBeavers, Kimberly, MD;  [facility];  Service: Gastroenterology;  Laterality: N/A;  . ENTEROSCOPY N/A 09/28/2018   Procedure: ENTEROSCOPY;  Surgeon: Jeani HawkingHung, Patrick, MD;  [facility];  Service: Endoscopy;  Laterality: N/A;  . ENTEROSCOPY N/A 10/28/2018   Procedure: ENTEROSCOPY;  Surgeon: Tressia DanasBeavers, Kimberly, MD;  [facility];  Service: Gastroenterology;  Laterality: N/A;  . ESOPHAGOGASTRODUODENOSCOPY (EGD) WITH PROPOFOL N/A 09/05/2018   Procedure: ESOPHAGOGASTRODUODENOSCOPY (EGD) WITH PROPOFOL;   Surgeon: Benancio DeedsArmbruster, Steven P, MD;  [facility];  Service: Gastroenterology;  Laterality: N/A;  . EXCISION MASS HEAD    . GIVENS CAPSULE STUDY N/A 09/06/2018   Procedure: GIVENS CAPSULE STUDY;  Surgeon: Tressia DanasBeavers, Kimberly, MD;  [facility];  Service: Gastroenterology;  Laterality: N/A;  . GIVENS CAPSULE STUDY N/A 09/26/2018   Procedure: GIVENS CAPSULE STUDY;  Surgeon: Beverley FiedlerPyrtle, Jay M, MD;  [facility];  Service: Gastroenterology;  Laterality: N/A;  . GIVENS CAPSULE STUDY N/A 06/14/2019   Procedure: GIVENS CAPSULE STUDY;  Surgeon: Napoleon FormNandigam, Kavitha V, MD;  Location: [facility] ENDOSCOPY;  Service: Endoscopy;  Laterality: N/A;  . HOT HEMOSTASIS N/A 10/28/2018   Procedure: HOT HEMOSTASIS (ARGON PLASMA COAGULATION/BICAP);  Surgeon: Tressia DanasBeavers, Kimberly, MD;  [facility];  Service: Gastroenterology;  Laterality: N/A;  . INCISION AND DRAINAGE PERIRECTAL ABSCESS N/A 06/05/2017   Procedure: IRRIGATION AND DEBRIDEMENT PERIRECTAL ABSCESS;  Surgeon: Andria MeuseWhite, Christopher M, MD;  Location: [facility] OR;  Service: General;  Laterality: N/A;  . VIDEO BRONCHOSCOPY Bilateral 05/08/2016   Procedure: VIDEO  BRONCHOSCOPY WITH FLUORO;  Surgeon: Oretha Milch, MD;  Location: [facility] ENDOSCOPY;  Service: Cardiopulmonary;  Laterality: Bilateral;    Family History  Problem Relation Age of Onset  . Heart disease Mother   . Diabetes Mother   . Colon cancer Mother   . Liver cancer Mother   . Cancer Father        type unknown  . Diabetes Sister        x 2  . Diabetes Brother      Social History   Tobacco Use  . Smoking status: Current Some Day Smoker    Packs/day: 2.00    Years: 47.00    Pack years: 94.00    Types: Cigarettes  . Smokeless tobacco: Never Used  Vaping Use  . Vaping Use: Never used  Substance Use Topics  . Alcohol use: Not Currently    Alcohol/week: 0.0 standard drinks    Comment: last drink was before xmas  . Drug use: No    Types: Cocaine, Marijuana    Comment: "nothing in  20 years"    Prior to Admission medications   Medication Sig Start Date End Date Taking? Authorizing Provider  albuterol (PROVENTIL) (2.5 MG/3ML) 0.083% nebulizer solution Take 3 mLs (2.5 mg total) by nebulization every 4 (four) hours as needed for wheezing or shortness of breath. 12/31/18  Yes Nyoka Cowden, MD  albuterol (VENTOLIN HFA) 108 (90 Base) MCG/ACT inhaler INHALE 2 PUFFS INTO THE LUNGS EVERY 6 (SIX) HOURS AS NEEDED FOR WHEEZING OR SHORTNESS OF BREATH. 08/15/20 08/15/21 Yes Newlin, Enobong, MD  atorvastatin (LIPITOR) 80 MG tablet TAKE 1 TABLET EVERY DAY Patient taking differently: Take 80 mg by mouth daily. 08/03/20  Yes [name], MD  azithromycin (ZITHROMAX) 250 MG tablet Take 2 on day one then 1 daily x 4 days 10/04/20  Yes Nyoka Cowden, MD  Budeson-Glycopyrrol-Formoterol 160-9-4.8 MCG/ACT AERO INHALE 2 PUFFS INTO THE LUNGS 2 (TWO) TIMES DAILY. 04/27/20 04/27/21 Yes Nyoka Cowden, MD  carvedilol (COREG) 3.125 MG tablet TAKE 1 TABLET (3.125 MG TOTAL) BY MOUTH 2 (TWO) TIMES DAILY. Patient taking differently: Take 3.125 mg by mouth 2 (two) times daily. 06/14/20 06/14/21 Yes Newlin, Odette Horns, MD  ferrous sulfate 324 MG TBEC Take 1 tablet (324 mg total) by mouth daily. 03/31/20  Yes Pricilla Riffle, MD  furosemide (LASIX) 40 MG tablet TAKE 1 TABLET EVERY DAY Patient taking differently: Take 40 mg by mouth daily. 08/03/20  Yes Newlin, Odette Horns, MD  gabapentin (NEURONTIN) 300 MG capsule TAKE 1 CAPSULE (300 MG TOTAL) BY MOUTH AT BEDTIME. Patient taking differently: Take 300 mg by mouth at bedtime. 03/23/20 03/23/21 Yes [name], MD  Misc. Devices MISC Portable oxygen concentrator. Diagnosis COPD. 12/05/18  Yes Newlin, Enobong, MD  nitroGLYCERIN (NITROSTAT) 0.4 MG SL tablet PLACE 1 TABLET (0.4 MG TOTAL) UNDER THE TONGUE EVERY 5 (FIVE) MINUTES AS NEEDED FOR CHEST PAIN. CALL 911 IF SYMPTOMS DO NOT RESOLVE AFTER 3 MINUTES Patient taking differently: Place 0.4 mg under the tongue every 5  (five) minutes as needed for chest pain. 06/14/20 06/14/21 Yes [name], MD  pantoprazole (PROTONIX) 40 MG tablet Take 1 tablet (40 mg total) by mouth 2 (two) times daily before a meal. 06/15/19  Yes Sunnie Nielsen, DO  predniSONE (DELTASONE) 10 MG tablet Take  4 tablets by mouth every morning for 2 days, take 2 tabs every morning for 2 days,  then take 1 tab every morning for 2 days and stop Patient taking differently:  Take 10-40 mg by mouth See admin instructions. Take  4 tablets by mouth every morning for 2 days, take 2 tabs every morning for 2 days,  then take 1 tab every morning for 2 days and stop 10/04/20  Yes Wert, Charlaine Dalton, MD  XARELTO 20 MG TABS tablet TAKE 1 TABLET EVERY DAY  WITH  SUPPER Patient taking differently: Take 20 mg by mouth daily with supper. 08/03/20  Yes Newlin, Odette Horns, MD  DULoxetine (CYMBALTA) 60 MG capsule TAKE 1 CAPSULE (60 MG TOTAL) BY MOUTH DAILY. FOR PARESTHESIA Patient not taking: No sig reported 09/08/20 09/08/21  [name], MD  fluticasone (FLONASE) 50 MCG/ACT nasal spray Place 1 spray into both nostrils daily for 30 days. Patient not taking: Reported on 10/07/2020 11/12/18 01/29/20  Burnadette Pop, MD  TUDORZA PRESSAIR 400 MCG/ACT AEPB INHALE 1 PUFF INTO THE LUNGS IN THE MORNING AND AT BEDTIME. 03/29/20 04/04/20  Nyoka Cowden, MD    Current Facility-Administered Medications  Medication Dose Route Frequency Provider Last Rate Last Admin  . 0.9 %  sodium chloride infusion (Manually program via Guardrails IV Fluids)   Intravenous Once Caccavale, Sophia, PA-C      . albuterol (PROVENTIL) (2.5 MG/3ML) 0.083% nebulizer solution 2.5 mg  2.5 mg Nebulization Q4H PRN Bloomfield, Carley D, DO      . atorvastatin (LIPITOR) tablet 80 mg  80 mg Oral Daily Bloomfield, Carley D, DO      . azithromycin (ZITHROMAX) tablet 250 mg  250 mg Oral Daily Bloomfield, Carley D, DO      . Budeson-Glycopyrrol-Formoterol 160-9-4.8 MCG/ACT AERO 2 puff  2 puff Inhalation BID  Bloomfield, Carley D, DO      . carvedilol (COREG) tablet 3.125 mg  3.125 mg Oral BID Bloomfield, Carley D, DO      . gabapentin (NEURONTIN) capsule 300 mg  300 mg Oral QHS Bloomfield, Carley D, DO      . nitroGLYCERIN (NITROSTAT) SL tablet 0.4 mg  0.4 mg Sublingual Q5 min PRN Bloomfield, Carley D, DO      . pantoprazole (PROTONIX) injection 40 mg  40 mg Intravenous Q12H Bloomfield, Carley D, DO       Current Outpatient Medications  Medication Sig Dispense Refill  . albuterol (PROVENTIL) (2.5 MG/3ML) 0.083% nebulizer solution Take 3 mLs (2.5 mg total) by nebulization every 4 (four) hours as needed for wheezing or shortness of breath. 75 mL 12  . albuterol (VENTOLIN HFA) 108 (90 Base) MCG/ACT inhaler INHALE 2 PUFFS INTO THE LUNGS EVERY 6 (SIX) HOURS AS NEEDED FOR WHEEZING OR SHORTNESS OF BREATH. 18 g 5  . atorvastatin (LIPITOR) 80 MG tablet TAKE 1 TABLET EVERY DAY (Patient taking differently: Take 80 mg by mouth daily.) 90 tablet 1  . azithromycin (ZITHROMAX) 250 MG tablet Take 2 on day one then 1 daily x 4 days 6 tablet 0  . Budeson-Glycopyrrol-Formoterol 160-9-4.8 MCG/ACT AERO INHALE 2 PUFFS INTO THE LUNGS 2 (TWO) TIMES DAILY. 10.7 g 11  . carvedilol (COREG) 3.125 MG tablet TAKE 1 TABLET (3.125 MG TOTAL) BY MOUTH 2 (TWO) TIMES DAILY. (Patient taking differently: Take 3.125 mg by mouth 2 (two) times daily.) 180 tablet 1  . ferrous sulfate 324 MG TBEC Take 1 tablet (324 mg total) by mouth daily. 30 tablet   . furosemide (LASIX) 40 MG tablet TAKE 1 TABLET EVERY DAY (Patient taking differently: Take 40 mg by mouth daily.) 90 tablet 1  . gabapentin (NEURONTIN) 300 MG capsule TAKE 1 CAPSULE (300 MG TOTAL) BY  MOUTH AT BEDTIME. (Patient taking differently: Take 300 mg by mouth at bedtime.) 30 capsule 6  . Misc. Devices MISC Portable oxygen concentrator. Diagnosis COPD. 1 each 0  . nitroGLYCERIN (NITROSTAT) 0.4 MG SL tablet PLACE 1 TABLET (0.4 MG TOTAL) UNDER THE TONGUE EVERY 5 (FIVE) MINUTES AS NEEDED FOR  CHEST PAIN. CALL 911 IF SYMPTOMS DO NOT RESOLVE AFTER 3 MINUTES (Patient taking differently: Place 0.4 mg under the tongue every 5 (five) minutes as needed for chest pain.) 30 tablet 1  . pantoprazole (PROTONIX) 40 MG tablet Take 1 tablet (40 mg total) by mouth 2 (two) times daily before a meal. 60 tablet 0  . predniSONE (DELTASONE) 10 MG tablet Take  4 tablets by mouth every morning for 2 days, take 2 tabs every morning for 2 days,  then take 1 tab every morning for 2 days and stop (Patient taking differently: Take 10-40 mg by mouth See admin instructions. Take  4 tablets by mouth every morning for 2 days, take 2 tabs every morning for 2 days,  then take 1 tab every morning for 2 days and stop) 14 tablet 0  . XARELTO 20 MG TABS tablet TAKE 1 TABLET EVERY DAY  WITH  SUPPER (Patient taking differently: Take 20 mg by mouth daily with supper.) 90 tablet 1  . DULoxetine (CYMBALTA) 60 MG capsule TAKE 1 CAPSULE (60 MG TOTAL) BY MOUTH DAILY. FOR PARESTHESIA (Patient not taking: No sig reported) 30 capsule 1  . fluticasone (FLONASE) 50 MCG/ACT nasal spray Place 1 spray into both nostrils daily for 30 days. (Patient not taking: Reported on 10/07/2020) 0.002 g 0    Allergies as of 10/07/2020 - Review Complete 10/07/2020  Allergen Reaction Noted  . Lisinopril Cough 06/29/2014     Review of Systems:    Constitutional: No weight loss, fever or chills Skin: No rash  Cardiovascular: No chest pain  Respiratory: No SOB  Gastrointestinal: See HPI and otherwise negative Genitourinary: No dysuria  Neurological: +syncope Musculoskeletal: No new muscle or joint pain Hematologic: No bleeding or bruising Psychiatric: No history of depression or anxiety    Physical Exam:  Vital signs in last 24 hours: Temp:  [97.8 F (36.6 C)] 97.8 F (36.6 C) (04/29 0858) Pulse Rate:  [82-108] 82 (04/29 1030) Resp:  [17-21] 19 (04/29 1030) BP: (112-146)/(65-79) 112/67 (04/29 1030) SpO2:  [94 %-97 %] 95 % (04/29  1030) Weight:  [98.4 kg] 98.4 kg (04/29 0857)   General:   Pleasant AA male appears to be in NAD, Well developed, Well nourished, alert and cooperative Head:  Normocephalic and atraumatic. Eyes:   PEERL, EOMI. No icterus. Conjunctiva pink. Ears:  Normal auditory acuity. Neck:  Supple Throat: Oral cavity and pharynx without inflammation, swelling or lesion.  Lungs: Respirations even and unlabored. Lungs clear to auscultation bilaterally.   No wheezes, crackles, or rhonchi.  Heart: Normal S1, S2. No MRG. Regular rate and rhythm. No peripheral edema, cyanosis or pallor.  Abdomen:  Soft, nondistended, nontender. No rebound or guarding. Normal bowel sounds. No appreciable masses or hepatomegaly. Rectal:  Not performed.  Msk:  Symmetrical without gross deformities. Peripheral pulses intact.  Extremities:  Without edema, no deformity or joint abnormality. Neurologic:  Alert and  oriented x4;  grossly normal neurologically.  Skin:   Dry and intact without significant lesions or rashes. Psychiatric: Demonstrates good judgement and reason without abnormal affect or behaviors.   LAB RESULTS: Recent Labs    10/07/20 0912  WBC 12.3*  HGB 7.6*  HCT 31.1*  PLT 295   BMET Recent Labs    10/07/20 0912  NA 141  K 3.7  CL 100  CO2 31  GLUCOSE 141*  BUN 13  CREATININE 0.99  CALCIUM 9.4   LFT Recent Labs    10/07/20 0912  PROT 6.9  ALBUMIN 3.8  AST 37  ALT 23  ALKPHOS 55  BILITOT 0.5   PT/INR Recent Labs    10/07/20 1000  LABPROT 18.1*  INR 1.5*    STUDIES: CT Head Wo Contrast  Result Date: 10/07/2020 CLINICAL DATA:  Syncopal episode using the restroom today. EXAM: CT HEAD WITHOUT CONTRAST TECHNIQUE: Contiguous axial images were obtained from the base of the skull through the vertex without intravenous contrast. COMPARISON:  Head CT 07/25/2016. FINDINGS: Brain: No evidence of acute infarction, hemorrhage, hydrocephalus, extra-axial collection or mass lesion/mass effect.  Vascular: No hyperdense vessel or unexpected calcification. Skull: Intact.  No focal lesion. Sinuses/Orbits: Tiny mucous retention cyst or polyp left maxillary sinus noted. The patient is status post cataract surgery. Other: None. IMPRESSION: Negative head CT. Electronically Signed   By: Drusilla Kanner M.D.   On: 10/07/2020 09:52    Impression / Plan:   Impression: 1.  Acute on chronic anemia: Hemoglobin 7.6 from 10.4 a month ago, history of small bowel AVM bleed back in 2020, last pill capsule endoscopy in January 2021 normal; most likely small bowel AVM  2.  CAD: On Xarelto, last dose 4/28 at 9 PM 3.  COPD: Currently on Prednisone 4.  Foot tingling/numbness  Plan: 1.  We will consider repeat enteroscopy earliest 5/1 with Dr. Marina Goodell after Xarelto washout if patient is truly iron deficient/Hemoccult positive.  Did discuss risks, benefits, limitations and alternatives and the patient agrees to proceed. 2.  Patient can be on regular diet for now. 3.  Continue to monitor hemoglobin with transfusion as needed 4.  Please await any further recommendations from Dr. Marina Goodell later today.  Thank you for your kind consultation, we will continue to follow.  Violet Baldy Lemmon  10/07/2020, 11:13 AM  GI ATTENDING  Extensive history, laboratories, x-rays reviewed.  Patient seen and examined.  Agree with comprehensive consultation note as outlined above.  Patient presents with a syncopal episode while defecating.  Has had interval anemia.  History of small bowel AVMs.  Extensive GI work-ups as outlined.  On chronic anticoagulation therapy.  Iron studies and stool Hemoccult studies pending.  I think it is reasonable to perform upper endoscopy/enteroscopy during this hospital stay.  Waiting for adequate Xarelto washout in case endoscopic therapeutics required.  In the interim, transfuse to clinically appropriate hemoglobin to ameliorate symptoms.  We will follow.  Wilhemina Bonito. Eda Keys., M.D. [facility] Division of Gastroenterology

## 2020-10-07 NOTE — ED Notes (Signed)
Pt placed on 2L via [place] for comfort. SpO2 95% on 2L.

## 2020-10-07 NOTE — H&P (Signed)
Date: 10/07/2020               Patient Name:  Jared [name] MRN: [id number]  DOB: 1956/02/27 Age / Sex: 65 y.o., male   PCP: [name], MD         Medical Service: Internal Medicine Teaching Service         Attending Physician: Dr. Reymundo Poll, MD    First Contact: Dr. Austin Miles Pager: [phone]  Second Contact: Dr. Huel Cote Pager: [phone]       After Hours (After 5p/  First Contact Pager: [phone]  weekends / holidays): Second Contact Pager: [phone]   Chief Complaint: syncope  History of Present Illness: Jared [name] is a 65 y/o gentleman with history of paroxysmal atrial flutter on Xarelto, NICM, CAD, COPD, CVA who presents after a syncopal episode. He reports he got up to use the bathroom and was sitting on the toilet when he fainted. He was not straining significantly when the episode occurred. Denies chest pain, light headedness, nausea, or diaphoresis prior to syncopal episode. He did not sustain any injuries. He states he had a similar event 2 weeks ago. Endorses worsening dyspnea on exertion compared to his baseline and is on treatment for a COPD exacerbation after seeing pulmonology on 4/26. Also feels intermittent palpitations which he says is fairly normal for him. He is on chronic iron supplementation, so it is difficult for him to tell a change in his stool color.  In addition to xarelto, he takes ibuprofen 2-3x per week. No alcohol use.  Denies fevers, chills, recent illness, chest pain, shortness of breath at rest, abdominal pain, n/v, hematemesis, hematochezia, hematuria, unexplained weight loss, focal weakness, numbness/tingling.   Meds:  Current Meds  Medication Sig  . albuterol (PROVENTIL) (2.5 MG/3ML) 0.083% nebulizer solution Take 3 mLs (2.5 mg total) by nebulization every 4 (four) hours as needed for wheezing or shortness of breath.  Marland Kitchen albuterol (VENTOLIN HFA) 108 (90 Base) MCG/ACT inhaler INHALE 2 PUFFS INTO THE LUNGS EVERY 6 (SIX) HOURS AS NEEDED FOR  WHEEZING OR SHORTNESS OF BREATH.  Marland Kitchen atorvastatin (LIPITOR) 80 MG tablet TAKE 1 TABLET EVERY DAY (Patient taking differently: Take 80 mg by mouth daily.)  . azithromycin (ZITHROMAX) 250 MG tablet Take 2 on day one then 1 daily x 4 days  . Budeson-Glycopyrrol-Formoterol 160-9-4.8 MCG/ACT AERO INHALE 2 PUFFS INTO THE LUNGS 2 (TWO) TIMES DAILY.  . carvedilol (COREG) 3.125 MG tablet TAKE 1 TABLET (3.125 MG TOTAL) BY MOUTH 2 (TWO) TIMES DAILY. (Patient taking differently: Take 3.125 mg by mouth 2 (two) times daily.)  . ferrous sulfate 324 MG TBEC Take 1 tablet (324 mg total) by mouth daily.  . furosemide (LASIX) 40 MG tablet TAKE 1 TABLET EVERY DAY (Patient taking differently: Take 40 mg by mouth daily.)  . gabapentin (NEURONTIN) 300 MG capsule TAKE 1 CAPSULE (300 MG TOTAL) BY MOUTH AT BEDTIME. (Patient taking differently: Take 300 mg by mouth at bedtime.)  . Misc. Devices MISC Portable oxygen concentrator. Diagnosis COPD.  . nitroGLYCERIN (NITROSTAT) 0.4 MG SL tablet PLACE 1 TABLET (0.4 MG TOTAL) UNDER THE TONGUE EVERY 5 (FIVE) MINUTES AS NEEDED FOR CHEST PAIN. CALL 911 IF SYMPTOMS DO NOT RESOLVE AFTER 3 MINUTES (Patient taking differently: Place 0.4 mg under the tongue every 5 (five) minutes as needed for chest pain.)  . pantoprazole (PROTONIX) 40 MG tablet Take 1 tablet (40 mg total) by mouth 2 (two) times daily before a meal.  . predniSONE (DELTASONE) 10 MG  tablet Take  4 tablets by mouth every morning for 2 days, take 2 tabs every morning for 2 days,  then take 1 tab every morning for 2 days and stop (Patient taking differently: Take 10-40 mg by mouth See admin instructions. Take  4 tablets by mouth every morning for 2 days, take 2 tabs every morning for 2 days,  then take 1 tab every morning for 2 days and stop)  . XARELTO 20 MG TABS tablet TAKE 1 TABLET EVERY DAY  WITH  SUPPER (Patient taking differently: Take 20 mg by mouth daily with supper.)     Allergies: Allergies as of 10/07/2020 - Review  Complete 10/07/2020  Allergen Reaction Noted  . Lisinopril Cough 06/29/2014   Past Medical History:  Diagnosis Date  . AVM (arteriovenous malformation)   . CAD (coronary artery disease)    a. LHC 5/12:  LAD 20, pLCx 20, pRCA 40, dRCA 40, EF 35%, diff HK  //  b. Myoview 4/16: Overall Impression: High risk stress nuclear study There is no evidence of ischemia. There is severe LV dysfunction. LV Ejection Fraction: 30%. LV Wall Motion: There is global LV hypokinesis.   Marland Kitchen CAP (community acquired pneumonia) 09/2013  . Chronic combined systolic and diastolic CHF (congestive heart failure) (HCC)    a. Echo 4/16:Mild LVH, EF 40-45%, diffuse HK //  b. Echo 8/17: EF 35-40%, diffuse HK, diastolic dysfunction, aortic sclerosis, trivial MR, moderate LAE, normal RVSF, moderate RAE, mild TR, PASP 42 mmHg // c. Echo 4/18: Mild concentric LVH, EF 30-35, normal wall motion, grade 1 diastolic dysfunction, PASP 49  . Chronic respiratory failure (HCC)   . Cluster headache    "hx; haven't had one in awhile" (01/09/2016)  . COPD (chronic obstructive pulmonary disease) (HCC)    [name] 01/09/2016  . History of CVA (cerebrovascular accident)   . Hypertension   . IDA (iron deficiency anemia)   . Moderate tobacco use disorder   . NICM (nonischemic cardiomyopathy) (HCC)   . Nicotine addiction   . Tobacco abuse     Family History:  Family History  Problem Relation Age of Onset  . Heart disease Mother   . Diabetes Mother   . Colon cancer Mother   . Liver cancer Mother   . Cancer Father        type unknown  . Diabetes Sister        x 2  . Diabetes Brother    Social History: lives at home by himself. Smokes about 5 cigarettes per day. Denies EtOH or illicit drug use.   Review of Systems: A complete ROS was negative except as per HPI.   Physical Exam: Blood pressure 112/67, pulse 82, temperature 97.8 F (36.6 C), temperature source Oral, resp. rate 19, height 6\' 3"  (1.905 m), weight 98.4 kg, SpO2 95  %. General: awake, alert, sitting up in bed in NAD HEENT: [place]/AT; conjunctival pallor; EOMI. Moist mucous membranes. No oropharyngeal erythema or exudates Neck: supple; no thyromegaly or tender LAD CV: regular rate, irregular rhythm. No m/r/g Pulm: normal work of breathing on room air; lungs with scattered expiratory wheeze  Abd: BS+; abdomen soft, non-tender, non-distended Ext: warm and well perfused; no edema Neuro: A&Ox4; non-focal    Assessment & Plan by Problem: Active Problems:   Symptomatic anemia  Jared [name] is a 65 y/o gentleman with history of paroxysmal atrial flutter on Xarelto, NICM, CAD, COPD, CVA who presents after a syncopal episode. Work-up significant for acute on chronic anemia with  hgb of 7.6 and mildly elevated troponin of 28. He was admitted for further management of symptomatic anemia.   Syncope 2/2 symptomatic anemia -hgb 7.6 on admission which is down from 10.4 1 month ago -it is difficult for him tell if stools have changed since he is on chronic iron supplementation -he has had upper EGD and capsule endoscopy in the past which have shown small bowel AVMs which I suspect is the etiology for current acute on chronic anemia -he is currently HDS -transfusing 1 unit pRBCs; f/u post transfusion H&H -start IV protonix BID  -iron studies pending  -GI has been consulted; greatly appreciate their recommendations. Will keep on clears for now until decision is made for endoscopic eval. May need to wait for xarelto to wash out.  -given this is second admission for symptomatic anemia, will need to continue having discussion of risks vs benefits of continuing anticoagulation   NICM, HFrEF CAD -continue Coreg, atorvastatin -appears euvolemic on exam; holding lasix for now  -monitor volume status   COPD -currently on course of azithro and prednisone for mild exacerbation; will continue while he is here -continue home inhaler regimen   Paroxsymal atrial flutter -xarelto  on hold as above -rate well controlled on Coreg   DVT ppx: SCDs Diet: clear liquid CODE: FULL   Dispo: Admit patient to Observation with expected length of stay less than 2 midnights.  SignedBridget Hartshorn, DO 10/07/2020, 11:15 AM  Pager: [phone] After 5pm on weekdays and 1pm on weekends: On Call pager: [phone]

## 2020-10-07 NOTE — Plan of Care (Signed)
  Problem: Education: Goal: Knowledge of General Education information will improve Description: Including pain rating scale, medication(s)/side effects and non-pharmacologic comfort measures Outcome: Progressing   Problem: Clinical Measurements: Goal: Will remain free from infection Outcome: Progressing Goal: Diagnostic test results will improve Outcome: Progressing Goal: Cardiovascular complication will be avoided Outcome: Progressing   

## 2020-10-07 NOTE — ED Notes (Signed)
Pt endorses sudden onset L sided CP, 8/10, pt states feels same as previous episode. Chesley Mires, MD notified. EKG obtained. 0.4 mg SL ntg given. Pt warm, dry, pink, no diaphoresis noted.

## 2020-10-07 NOTE — ED Notes (Signed)
Pt refused POC occult card.

## 2020-10-08 DIAGNOSIS — K558 Other vascular disorders of intestine: Secondary | ICD-10-CM

## 2020-10-08 LAB — BPAM RBC
Blood Product Expiration Date: 202205242359
ISSUE DATE / TIME: 202204291223
Unit Type and Rh: 5100

## 2020-10-08 LAB — CBC
HCT: 30.7 % — ABNORMAL LOW (ref 39.0–52.0)
Hemoglobin: 7.8 g/dL — ABNORMAL LOW (ref 13.0–17.0)
MCH: 20.9 pg — ABNORMAL LOW (ref 26.0–34.0)
MCHC: 25.4 g/dL — ABNORMAL LOW (ref 30.0–36.0)
MCV: 82.1 fL (ref 80.0–100.0)
Platelets: 236 10*3/uL (ref 150–400)
RBC: 3.74 MIL/uL — ABNORMAL LOW (ref 4.22–5.81)
RDW: 21.3 % — ABNORMAL HIGH (ref 11.5–15.5)
WBC: 8.6 10*3/uL (ref 4.0–10.5)
nRBC: 0.6 % — ABNORMAL HIGH (ref 0.0–0.2)

## 2020-10-08 LAB — TYPE AND SCREEN
ABO/RH(D): O POS
Antibody Screen: NEGATIVE
Unit division: 0

## 2020-10-08 LAB — BASIC METABOLIC PANEL
Anion gap: 8 (ref 5–15)
BUN: 11 mg/dL (ref 8–23)
CO2: 32 mmol/L (ref 22–32)
Calcium: 8.6 mg/dL — ABNORMAL LOW (ref 8.9–10.3)
Chloride: 101 mmol/L (ref 98–111)
Creatinine, Ser: 0.94 mg/dL (ref 0.61–1.24)
GFR, Estimated: >60 mL/min (ref >60)
Glucose, Bld: 152 mg/dL — ABNORMAL HIGH (ref 70–99)
Potassium: 4.3 mmol/L (ref 3.5–5.1)
Sodium: 141 mmol/L (ref 135–145)

## 2020-10-08 LAB — SURGICAL PCR SCREEN
MRSA, PCR: NEGATIVE
Staphylococcus aureus: NEGATIVE

## 2020-10-08 NOTE — H&P (View-Only) (Signed)
HISTORY OF PRESENT ILLNESS:  Jared [name] is a 65 y.o. male who is seen in consultation yesterday regarding interval anemia on anticoagulation in a patient with known gastrointestinal AVMs.  He is sitting comfortably in the chair this afternoon.  No complaints.  No further episodes of presyncope or complaints of dizziness.  No bowel movements  LABS: Hemoglobin went from 7.6->>7.8  REVIEW OF SYSTEMS:  All non-GI ROS negative otherwise  Past Medical History:  Diagnosis Date  . AVM (arteriovenous malformation)   . CAD (coronary artery disease)    a. LHC 5/12:  LAD 20, pLCx 20, pRCA 40, dRCA 40, EF 35%, diff HK  //  b. Myoview 4/16: Overall Impression: High risk stress nuclear study There is no evidence of ischemia. There is severe LV dysfunction. LV Ejection Fraction: 30%. LV Wall Motion: There is global LV hypokinesis.   Marland Kitchen CAP (community acquired pneumonia) 09/2013  . Chronic combined systolic and diastolic CHF (congestive heart failure) (HCC)    a. Echo 4/16:Mild LVH, EF 40-45%, diffuse HK //  b. Echo 8/17: EF 35-40%, diffuse HK, diastolic dysfunction, aortic sclerosis, trivial MR, moderate LAE, normal RVSF, moderate RAE, mild TR, PASP 42 mmHg // c. Echo 4/18: Mild concentric LVH, EF 30-35, normal wall motion, grade 1 diastolic dysfunction, PASP 49  . Chronic respiratory failure (HCC)   . Cluster headache    "hx; haven't had one in awhile" (01/09/2016)  . COPD (chronic obstructive pulmonary disease) (HCC)    Jared [name] 01/09/2016  . History of CVA (cerebrovascular accident)   . Hypertension   . IDA (iron deficiency anemia)   . Moderate tobacco use disorder   . NICM (nonischemic cardiomyopathy) (HCC)   . Nicotine addiction   . Tobacco abuse     Past Surgical History:  Procedure Laterality Date  . CARDIAC CATHETERIZATION  10/2010   LM normal, LAD with 20% irregularities, LCX with 20%, RCA with 40% prox and 40% distal - EF of 35%  . CATARACT EXTRACTION, BILATERAL    . COLONOSCOPY W/  BIOPSIES AND POLYPECTOMY    . COLONOSCOPY WITH PROPOFOL N/A 09/06/2018   Procedure: COLONOSCOPY WITH PROPOFOL;  Surgeon: Tressia Danas, MD;  Location: [facility] ENDOSCOPY;  Service: Gastroenterology;  Laterality: N/A;  . ENTEROSCOPY N/A 09/28/2018   Procedure: ENTEROSCOPY;  Surgeon: Jeani Hawking, MD;  Location[facility] ENDOSCOPY;  Service: Endoscopy;  Laterality: N/A;  . ENTEROSCOPY N/A 10/28/2018   Procedure: ENTEROSCOPY;  Surgeon: Tressia Danas, MD;  Location[facility] ENDOSCOPY;  Service: Gastroenterology;  Laterality: N/A;  . ESOPHAGOGASTRODUODENOSCOPY (EGD) WITH PROPOFOL N/A 09/05/2018   Procedure: ESOPHAGOGASTRODUODENOSCOPY (EGD) WITH PROPOFOL;  Surgeon: Benancio Deeds, MD;  Location: [facility] ENDOSCOPY;  Service: Gastroenterology;  Laterality: N/A;  . EXCISION MASS HEAD    . GIVENS CAPSULE STUDY N/A 09/06/2018   Procedure: GIVENS CAPSULE STUDY;  Surgeon: Tressia Danas, MD;  Location: [facility] ENDOSCOPY;  Service: Gastroenterology;  Laterality: N/A;  . GIVENS CAPSULE STUDY N/A 09/26/2018   Procedure: GIVENS CAPSULE STUDY;  Surgeon: Beverley Fiedler, MD;  Location: [facility] ENDOSCOPY;  Service: Gastroenterology;  Laterality: N/A;  . GIVENS CAPSULE STUDY N/A 06/14/2019   Procedure: GIVENS CAPSULE STUDY;  Surgeon: Napoleon Form, MD;  Location: [facility] ENDOSCOPY;  Service: Endoscopy;  Laterality: N/A;  . HOT HEMOSTASIS N/A 10/28/2018   Procedure: HOT HEMOSTASIS (ARGON PLASMA COAGULATION/BICAP);  Surgeon: Tressia Danas, MD;  Location: [facility] ENDOSCOPY;  Service: Gastroenterology;  Laterality: N/A;  . INCISION AND DRAINAGE PERIRECTAL ABSCESS N/A 06/05/2017   Procedure: IRRIGATION AND DEBRIDEMENT PERIRECTAL ABSCESS;  Surgeon: Andria Meuse, MD;  Location: [facility] OR;  Service: General;  Laterality: N/A;  . VIDEO BRONCHOSCOPY Bilateral 05/08/2016   Procedure: VIDEO BRONCHOSCOPY WITH FLUORO;  Surgeon: Oretha Milch, MD;  Location: [facility] ENDOSCOPY;  Service: Cardiopulmonary;  Laterality: Bilateral;    Social History Jared [name]  reports that he has been smoking cigarettes. He has a 94.00 pack-year smoking history. He has never used smokeless tobacco. He reports previous alcohol use. He reports that he does not use drugs.  family history includes Cancer in his father; Colon cancer in his mother; Diabetes in his brother, mother, and sister; Heart disease in his mother; Liver cancer in his mother.  Allergies  Allergen Reactions  . Lisinopril Cough       PHYSICAL EXAMINATION: Vital signs: BP 138/77 (BP Location: Left Arm)   Pulse 85   Temp 98.3 F (36.8 C) (Oral)   Resp 16   Ht 6\' 3"  (1.905 m)   Wt 96.6 kg   SpO2 99%   BMI 26.62 kg/m   Constitutional: generally well-appearing, no acute distress Psychiatric: alert and oriented x3, cooperative Eyes: Anicteric Abdomen: Not reexamined Skin: no lesions on visible extremities Neuro: No focal deficits.   ASSESSMENT:  1.  Interval anemia.  Rule out GI bleed in a patient on Xarelto with a history of GI AVMs 2.  History of GI AVMs 3.  Chronic anticoagulation.  Xarelto washout being completed 4.  Multiple medical problems  PLAN:  1.  Plan enteroscopy tomorrow.  Patient is high risk given his comorbidities.The nature of the procedure, as well as the risks, benefits, and alternatives were carefully and thoroughly reviewed with the patient. Ample time for discussion and questions allowed. The patient understood, was satisfied, and agreed to proceed.  . Wilhemina Bonito., M.D. [facility] Division of Gastroenterology

## 2020-10-08 NOTE — Progress Notes (Addendum)
Subjective:  O/N Events: None   Mr. Streett was evaluated at bedside. He states that he is doing well this AM. He has not had any episodes of light headedness or reoccurring symptoms.   Objective:  Vital signs in last 24 hours: Vitals:   10/07/20 1518 10/07/20 1751 10/07/20 2016 10/08/20 0210  BP: 123/78 123/76 (!) 144/75 125/81  Pulse: 74 75 80 70  Resp: 18 18 14 19   Temp: 97.9 F (36.6 C) 98 F (36.7 C) 98.4 F (36.9 C) 97.9 F (36.6 C)  TempSrc: Oral Oral Oral Oral  SpO2: 99% 99% 99% 98%  Weight:      Height:       Physical Exam Constitutional:      General: He is not in acute distress.    Appearance: Normal appearance. He is normal weight. He is not ill-appearing or toxic-appearing.  Cardiovascular:     Rate and Rhythm: Normal rate and regular rhythm.     Pulses: Normal pulses.     Heart sounds: No murmur heard. No friction rub. No gallop.   Pulmonary:     Effort: Pulmonary effort is normal.     Breath sounds: Normal breath sounds. No wheezing.  Abdominal:     General: Abdomen is flat. Bowel sounds are normal.     Palpations: Abdomen is soft.     Tenderness: There is no abdominal tenderness. There is no guarding.  Musculoskeletal:     Right lower leg: No edema.     Left lower leg: No edema.  Neurological:     Mental Status: He is alert and oriented to person, place, and time.     CBC Latest Ref Rng & Units 10/08/2020 10/07/2020 10/07/2020  WBC 4.0 - 10.5 K/uL 8.6 8.9 12.3(H)  Hemoglobin 13.0 - 17.0 g/dL 7.8(L) 8.2(L) 7.6(L)  Hematocrit 39.0 - 52.0 % 30.7(L) 32.2(L) 31.1(L)  Platelets 150 - 400 K/uL 236 225 295   BMP Latest Ref Rng & Units 10/08/2020 10/07/2020 08/09/2020  Glucose 70 - 99 mg/dL 10/09/2020) 825(K) 539(J)  BUN 8 - 23 mg/dL 11 13 18   Creatinine 0.61 - 1.24 mg/dL 673(A 1.93  BUN/Creat Ratio 10 - 24 - - -  Sodium 135 - 145 mmol/L 141 141 141  Potassium 3.5 - 5.1 mmol/L 4.3 3.7 4.0  Chloride 98 - 111 mmol/L 101 100 100  CO2 22 - 32 mmol/L 32 31  35(H)  Calcium 8.9 - 10.3 mg/dL 7.90) 9.4 9.4     Assessment/Plan:  Active Problems:   Acute on chronic anemia  Mr. Flavell is a 65 y/o gentleman with history of paroxysmal atrial flutter on Xarelto, NICM, CAD, COPD, CVA who presents after a syncopal episode. Work-up significant for acute on chronic anemia with hgb of 7.6 and mildly elevated troponin of 28. He was admitted for further management of symptomatic anemia.   Syncope 2/2 symptomatic anemia: Patient presenting with symptomatic anemia with initial hgb of 7.6 from 10.4 one month prior. Does have Hx of small bowel AVMs on capsule endoscopy. S/P 1 unit of PRBC with increase in Hgb to 8.2. 7.8 today, asymptomatic.  - Continue IV protonix 40 mg BID  - GI has been consulted; greatly appreciate their recommendations.  - Continue clear diet  - Continue washout of Xarelto for Enteroscopy at earliest time (5/1).  - Iron studies consistent with iron deficiency will undergo Feraheme infusion during his hospital course.  NICM, HFrEF CAD:  -continue Coreg, atorvastatin -monitor volume status   COPD:  -  currently on course of azithro and prednisone for mild exacerbation; will continue while he is here -continue home inhaler regimen   Paroxsymal Atrial Flutter - Holding Xarelto  - Continue Coreg  DVT ppx: SCDs Diet: clear liquid CODE: FULL  Barriers to Discharge:Continued Medical work up Dispo: Anticipated discharge in approximately 2-3 day(s).   Dolan Amen, MD 10/08/2020, 6:15 AM Pager: [phone] After 5pm on weekdays and 1pm on weekends: On Call pager [phone]

## 2020-10-08 NOTE — Progress Notes (Addendum)
HISTORY OF PRESENT ILLNESS:  Jared [name] is a 65 y.o. male who is seen in consultation yesterday regarding interval anemia on anticoagulation in a patient with known gastrointestinal AVMs.  He is sitting comfortably in the chair this afternoon.  No complaints.  No further episodes of presyncope or complaints of dizziness.  No bowel movements  LABS: Hemoglobin went from 7.6->>7.8  REVIEW OF SYSTEMS:  All non-GI ROS negative otherwise  Past Medical History:  Diagnosis Date  . AVM (arteriovenous malformation)   . CAD (coronary artery disease)    a. LHC 5/12:  LAD 20, pLCx 20, pRCA 40, dRCA 40, EF 35%, diff HK  //  b. Myoview 4/16: Overall Impression: High risk stress nuclear study There is no evidence of ischemia. There is severe LV dysfunction. LV Ejection Fraction: 30%. LV Wall Motion: There is global LV hypokinesis.   . CAP (community acquired pneumonia) 09/2013  . Chronic combined systolic and diastolic CHF (congestive heart failure) (HCC)    a. Echo 4/16:Mild LVH, EF 40-45%, diffuse HK //  b. Echo 8/17: EF 35-40%, diffuse HK, diastolic dysfunction, aortic sclerosis, trivial MR, moderate LAE, normal RVSF, moderate RAE, mild TR, PASP 42 mmHg // c. Echo 4/18: Mild concentric LVH, EF 30-35, normal wall motion, grade 1 diastolic dysfunction, PASP 49  . Chronic respiratory failure (HCC)   . Cluster headache    "hx; haven't had one in awhile" (01/09/2016)  . COPD (chronic obstructive pulmonary disease) (HCC)    /notes 01/09/2016  . History of CVA (cerebrovascular accident)   . Hypertension   . IDA (iron deficiency anemia)   . Moderate tobacco use disorder   . NICM (nonischemic cardiomyopathy) (HCC)   . Nicotine addiction   . Tobacco abuse     Past Surgical History:  Procedure Laterality Date  . CARDIAC CATHETERIZATION  10/2010   LM normal, LAD with 20% irregularities, LCX with 20%, RCA with 40% prox and 40% distal - EF of 35%  . CATARACT EXTRACTION, BILATERAL    . COLONOSCOPY W/  BIOPSIES AND POLYPECTOMY    . COLONOSCOPY WITH PROPOFOL N/A 09/06/2018   Procedure: COLONOSCOPY WITH PROPOFOL;  Surgeon: Beavers, Kimberly, MD;  Location: [facility] ENDOSCOPY;  Service: Gastroenterology;  Laterality: N/A;  . ENTEROSCOPY N/A 09/28/2018   Procedure: ENTEROSCOPY;  Surgeon: Hung, Patrick, MD;  Location: [facility] ENDOSCOPY;  Service: Endoscopy;  Laterality: N/A;  . ENTEROSCOPY N/A 10/28/2018   Procedure: ENTEROSCOPY;  Surgeon: Beavers, Kimberly, MD;  Location: [facility] ENDOSCOPY;  Service: Gastroenterology;  Laterality: N/A;  . ESOPHAGOGASTRODUODENOSCOPY (EGD) WITH PROPOFOL N/A 09/05/2018   Procedure: ESOPHAGOGASTRODUODENOSCOPY (EGD) WITH PROPOFOL;  Surgeon: Armbruster, Steven P, MD;  Location: [facility] ENDOSCOPY;  Service: Gastroenterology;  Laterality: N/A;  . EXCISION MASS HEAD    . GIVENS CAPSULE STUDY N/A 09/06/2018   Procedure: GIVENS CAPSULE STUDY;  Surgeon: Beavers, Kimberly, MD;  Location: [facility] ENDOSCOPY;  Service: Gastroenterology;  Laterality: N/A;  . GIVENS CAPSULE STUDY N/A 09/26/2018   Procedure: GIVENS CAPSULE STUDY;  Surgeon: Pyrtle, Jay M, MD;  Location: [facility] ENDOSCOPY;  Service: Gastroenterology;  Laterality: N/A;  . GIVENS CAPSULE STUDY N/A 06/14/2019   Procedure: GIVENS CAPSULE STUDY;  Surgeon: Nandigam, Kavitha V, MD;  Location: [facility] ENDOSCOPY;  Service: Endoscopy;  Laterality: N/A;  . HOT HEMOSTASIS N/A 10/28/2018   Procedure: HOT HEMOSTASIS (ARGON PLASMA COAGULATION/BICAP);  Surgeon: Beavers, Kimberly, MD;  Location: [facility] ENDOSCOPY;  Service: Gastroenterology;  Laterality: N/A;  . INCISION AND DRAINAGE PERIRECTAL ABSCESS N/A 06/05/2017   Procedure: IRRIGATION AND DEBRIDEMENT PERIRECTAL ABSCESS;    Surgeon: Andria Meuse, MD;  Location: [facility] OR;  Service: General;  Laterality: N/A;  . VIDEO BRONCHOSCOPY Bilateral 05/08/2016   Procedure: VIDEO BRONCHOSCOPY WITH FLUORO;  Surgeon: Oretha Milch, MD;  Location: [facility] ENDOSCOPY;  Service: Cardiopulmonary;  Laterality: Bilateral;    Social History Jared [name]  reports that he has been smoking cigarettes. He has a 94.00 pack-year smoking history. He has never used smokeless tobacco. He reports previous alcohol use. He reports that he does not use drugs.  family history includes Cancer in his father; Colon cancer in his mother; Diabetes in his brother, mother, and sister; Heart disease in his mother; Liver cancer in his mother.  Allergies  Allergen Reactions  . Lisinopril Cough       PHYSICAL EXAMINATION: Vital signs: BP 138/77 (BP Location: Left Arm)   Pulse 85   Temp 98.3 F (36.8 C) (Oral)   Resp 16   Ht 6\' 3"  (1.905 m)   Wt 96.6 kg   SpO2 99%   BMI 26.62 kg/m   Constitutional: generally well-appearing, no acute distress Psychiatric: alert and oriented x3, cooperative Eyes: Anicteric Abdomen: Not reexamined Skin: no lesions on visible extremities Neuro: No focal deficits.   ASSESSMENT:  1.  Interval anemia.  Rule out GI bleed in a patient on Xarelto with a history of GI AVMs 2.  History of GI AVMs 3.  Chronic anticoagulation.  Xarelto washout being completed 4.  Multiple medical problems  PLAN:  1.  Plan enteroscopy tomorrow.  Patient is high risk given his comorbidities.The nature of the procedure, as well as the risks, benefits, and alternatives were carefully and thoroughly reviewed with the patient. Ample time for discussion and questions allowed. The patient understood, was satisfied, and agreed to proceed.  . Wilhemina Bonito., M.D. [facility] Division of Gastroenterology

## 2020-10-09 ENCOUNTER — Inpatient Hospital Stay (HOSPITAL_COMMUNITY): Payer: Medicare HMO | Admitting: Certified Registered Nurse Anesthetist

## 2020-10-09 ENCOUNTER — Encounter (HOSPITAL_COMMUNITY): Payer: Self-pay | Admitting: Internal Medicine

## 2020-10-09 ENCOUNTER — Encounter (HOSPITAL_COMMUNITY)
Admission: EM | Disposition: A | Discharge status: Home / Self Care | Payer: Self-pay | Source: Home / Self Care | Attending: Student in an Organized Health Care Education/Training Program

## 2020-10-09 DIAGNOSIS — R55 Syncope and collapse: Secondary | ICD-10-CM

## 2020-10-09 DIAGNOSIS — I480 Paroxysmal atrial fibrillation: Secondary | ICD-10-CM

## 2020-10-09 DIAGNOSIS — D649 Anemia, unspecified: Secondary | ICD-10-CM

## 2020-10-09 DIAGNOSIS — J441 Chronic obstructive pulmonary disease with (acute) exacerbation: Secondary | ICD-10-CM

## 2020-10-09 DIAGNOSIS — D509 Iron deficiency anemia, unspecified: Principal | ICD-10-CM

## 2020-10-09 HISTORY — PX: ENTEROSCOPY: SHX5533

## 2020-10-09 LAB — CBC
HCT: 32.4 % — ABNORMAL LOW (ref 39.0–52.0)
Hemoglobin: 8.2 g/dL — ABNORMAL LOW (ref 13.0–17.0)
MCH: 21 pg — ABNORMAL LOW (ref 26.0–34.0)
MCHC: 25.3 g/dL — ABNORMAL LOW (ref 30.0–36.0)
MCV: 82.9 fL (ref 80.0–100.0)
Platelets: 228 10*3/uL (ref 150–400)
RBC: 3.91 MIL/uL — ABNORMAL LOW (ref 4.22–5.81)
RDW: 21.2 % — ABNORMAL HIGH (ref 11.5–15.5)
WBC: 10.5 10*3/uL (ref 4.0–10.5)
nRBC: 0.4 % — ABNORMAL HIGH (ref 0.0–0.2)

## 2020-10-09 LAB — BASIC METABOLIC PANEL
Anion gap: 5 (ref 5–15)
BUN: 12 mg/dL (ref 8–23)
CO2: 36 mmol/L — ABNORMAL HIGH (ref 22–32)
Calcium: 9.1 mg/dL (ref 8.9–10.3)
Chloride: 100 mmol/L (ref 98–111)
Creatinine, Ser: 0.87 mg/dL (ref 0.61–1.24)
GFR, Estimated: >60 mL/min (ref >60)
Glucose, Bld: 118 mg/dL — ABNORMAL HIGH (ref 70–99)
Potassium: 4.4 mmol/L (ref 3.5–5.1)
Sodium: 141 mmol/L (ref 135–145)

## 2020-10-09 SURGERY — ENTEROSCOPY
Anesthesia: Monitor Anesthesia Care

## 2020-10-09 MED ORDER — PROPOFOL 10 MG/ML IV BOLUS
INTRAVENOUS | Status: DC | PRN
  Administered 2020-10-09 (×2): 20 mg via INTRAVENOUS
  Administered 2020-10-09: 10 mg via INTRAVENOUS
  Administered 2020-10-09: 20 mg via INTRAVENOUS

## 2020-10-09 MED ORDER — GABAPENTIN 300 MG PO CAPS
300.0000 mg | ORAL_CAPSULE | Freq: Once | ORAL | Status: AC
  Administered 2020-10-09: 300 mg via ORAL
  Filled 2020-10-09: qty 1

## 2020-10-09 MED ORDER — SODIUM CHLORIDE 0.9 % IV SOLN: INTRAVENOUS | Status: DC

## 2020-10-09 MED ORDER — PROPOFOL 500 MG/50ML IV EMUL
INTRAVENOUS | Status: DC | PRN
  Administered 2020-10-09: 75 ug/kg/min via INTRAVENOUS

## 2020-10-09 MED ORDER — ACETAMINOPHEN 325 MG PO TABS
650.0000 mg | ORAL_TABLET | Freq: Four times a day (QID) | ORAL | Status: DC | PRN
  Administered 2020-10-09: 650 mg via ORAL
  Filled 2020-10-09: qty 2

## 2020-10-09 MED ORDER — LACTATED RINGERS IV SOLN
INTRAVENOUS | Status: AC | PRN
  Administered 2020-10-09: 10 mL/h via INTRAVENOUS

## 2020-10-09 MED ORDER — SODIUM CHLORIDE 0.9 % IV SOLN
510.0000 mg | Freq: Once | INTRAVENOUS | Status: AC
  Administered 2020-10-09: 510 mg via INTRAVENOUS
  Filled 2020-10-09: qty 17

## 2020-10-09 MED ORDER — OXYCODONE-ACETAMINOPHEN 5-325 MG PO TABS
1.0000 | ORAL_TABLET | Freq: Once | ORAL | Status: DC
  Filled 2020-10-09: qty 1

## 2020-10-09 NOTE — Anesthesia Procedure Notes (Signed)
Procedure Name: MAC Date/Time: 10/09/2020 12:13 PM Performed by: Candis Shine, CRNA Pre-anesthesia Checklist: Patient identified, Emergency Drugs available, Suction available, Patient being monitored and Timeout performed Patient Re-evaluated:Patient Re-evaluated prior to induction Oxygen Delivery Method: Nasal cannula Dental Injury: Teeth and Oropharynx as per pre-operative assessment

## 2020-10-09 NOTE — Interval H&P Note (Signed)
History and Physical Interval Note:  10/09/2020 12:12 PM  Jared [name]  has presented today for surgery, with the diagnosis of Gi bleed.  The various methods of treatment have been discussed with the patient and family. After consideration of risks, benefits and other options for treatment, the patient has consented to  Procedure(s): ENTEROSCOPY (N/A) as a surgical intervention.  The patient's history has been reviewed, patient examined, no change in status, stable for surgery.  I have reviewed the patient's chart and labs.  Questions were answered to the patient's satisfaction.     [name]

## 2020-10-09 NOTE — Progress Notes (Signed)
Subjective:  O/N Events: None   [name] was evaluated at bedside. He states that he is doing well today. He does endorse a mild headache this morning, but is currently NPO.      Objective:  Vital signs in last 24 hours: Vitals:   10/08/20 1700 10/08/20 2020 10/08/20 2133 10/09/20 0600  BP: 126/79  130/71 133/81  Pulse: 79 72 75 79  Resp: 16 16 18 14   Temp: 98.1 F (36.7 C)  97.7 F (36.5 C) 98.7 F (37.1 C)  TempSrc: Oral  Oral Oral  SpO2: 98% 97% 96% 95%  Weight:      Height:       Physical Exam Constitutional:      General: He is not in acute distress.    Appearance: Normal appearance. He is not ill-appearing or toxic-appearing.  Cardiovascular:     Rate and Rhythm: Normal rate and regular rhythm.     Pulses: Normal pulses.     Heart sounds: Normal heart sounds. No murmur heard. No friction rub. No gallop.   Pulmonary:     Effort: Pulmonary effort is normal.     Breath sounds: Normal breath sounds. No wheezing or rales.  Abdominal:     General: Abdomen is flat. Bowel sounds are normal.     Tenderness: There is no abdominal tenderness. There is no guarding.  Neurological:     Mental Status: He is alert.     CBC Latest Ref Rng & Units 10/09/2020 10/08/2020 10/07/2020  WBC 4.0 - 10.5 K/uL 10.5 8.6 8.9  Hemoglobin 13.0 - 17.0 g/dL 8.2(L) 7.8(L) 8.2(L)  Hematocrit 39.0 - 52.0 % 32.4(L) 30.7(L) 32.2(L)  Platelets 150 - 400 K/uL 228 236 225   BMP Latest Ref Rng & Units 10/09/2020 10/08/2020 10/07/2020  Glucose 70 - 99 mg/dL 10/09/2020) 536(U) 440(H)  BUN 8 - 23 mg/dL 12 11 13   Creatinine 0.61 - 1.24 mg/dL 474(Q 5.95  BUN/Creat Ratio 10 - 24 - - -  Sodium 135 - 145 mmol/L 141 141 141  Potassium 3.5 - 5.1 mmol/L 4.4 4.3 3.7  Chloride 98 - 111 mmol/L 100 101 100  CO2 22 - 32 mmol/L 36(H) 32 31  Calcium 8.9 - 10.3 mg/dL 9.1 6.38) 9.4     Assessment/Plan:  Active Problems:   Acute on chronic anemia  [name] is a 65 y/o gentleman with history of paroxysmal  atrial flutter on Xarelto, NICM, CAD, COPD, CVA who presents after a syncopal episode. Work-up significant for acute on chronic anemia with hgb of 7.6 and mildly elevated troponin of 28. He was admitted for further management of symptomatic anemia.   Syncope 2/2 Symptomatic Anemia: (Resolved) Enteroscopy today with no evidence of pathology, will receive a feraheme infusion today before his discharge.  - Continue IV protonix 40 mg BID  - GI has been consulted; greatly appreciate their recommendations.   - Enteroscopy scheduled for today at 12:30 - Feraheme infusion today.   NICM, HFrEF CAD:  -continue Coreg, atorvastatin -monitor volume status   COPD Exacerbation: (Resolved)  -Completed azithromycin and prednisone course today -continue home inhaler regimen   Paroxsymal Atrial Flutter - Holding Xarelto  - Continue Coreg  Headache:  Cold compresses today   DVT ppx: SCDs Diet: NPO CODE: FULL  Barriers to Discharge: DC today after Feraheme infusion Dispo: Anticipated discharge in approximately today.   Jared Center, MD 10/09/2020, 6:01 AM Pager: [phone] After 5pm on weekdays and 1pm on weekends: On Call pager [phone]

## 2020-10-09 NOTE — Progress Notes (Addendum)
Telemetry informed nurse that patient experienced a 8 beat run of V-tach followed by a 3 beat run of V-tach in the same strip, patient reported having a severe headache. MD has been notified no new orders have been given at this time, RN will continue to monitor this patient.

## 2020-10-09 NOTE — Anesthesia Preprocedure Evaluation (Addendum)
Anesthesia Evaluation  Patient identified by MRN, date of birth, ID band Patient awake    Reviewed: Allergy & Precautions, NPO status , Patient's Chart, lab work & pertinent test results  Airway Mallampati: II  TM Distance: >3 FB Neck ROM: Full    Dental  (+) Dental Advisory Given, Missing   Pulmonary COPD, Current Smoker and Patient abstained from smoking.,    breath sounds clear to auscultation       Cardiovascular hypertension, + CAD and +CHF   Rhythm:Irregular Rate:Normal     Neuro/Psych  Headaches, negative psych ROS   GI/Hepatic negative GI ROS, Neg liver ROS,   Endo/Other  negative endocrine ROS  Renal/GU negative Renal ROS     Musculoskeletal negative musculoskeletal ROS (+)   Abdominal Normal abdominal exam  (+)   Peds  Hematology negative hematology ROS (+)   Anesthesia Other Findings   Reproductive/Obstetrics                            Anesthesia Physical Anesthesia Plan  ASA: III  Anesthesia Plan: MAC   Post-op Pain Management:    Induction: Intravenous  PONV Risk Score and Plan: 0 and Propofol infusion  Airway Management Planned: Natural Airway and Simple Face Mask  Additional Equipment: None  Intra-op Plan:   Post-operative Plan:   Informed Consent: I have reviewed the patients History and Physical, chart, labs and discussed the procedure including the risks, benefits and alternatives for the proposed anesthesia with the patient or authorized representative who has indicated his/her understanding and acceptance.       Plan Discussed with: CRNA  Anesthesia Plan Comments:        Anesthesia Quick Evaluation

## 2020-10-09 NOTE — Transfer of Care (Signed)
Immediate Anesthesia Transfer of Care Note  Patient: Jared [name]  Procedure(s) Performed: ENTEROSCOPY (N/A )  Patient Location: PACU  Anesthesia Type:MAC  Level of Consciousness: awake, alert  and oriented  Airway & Oxygen Therapy: Patient Spontanous Breathing and Patient connected to nasal cannula oxygen  Post-op Assessment: Report given to RN and Post -op Vital signs reviewed and stable  Post vital signs: Reviewed and stable  Last Vitals:  Vitals Value Taken Time  BP 118/63   Temp    Pulse 78   Resp 15   SpO2 99     Last Pain:  Vitals:   10/09/20 1124  TempSrc: Oral  PainSc: 0-No pain         Complications: No complications documented.

## 2020-10-09 NOTE — Progress Notes (Signed)
DISCHARGE NOTE HOME [facility] to be discharged home per MD order. Discussed prescriptions and follow up appointments with the patient. Prescriptions given to patient; medication list explained in detail. Patient verbalized understanding.  Skin clean, dry and intact without evidence of skin break down, no evidence of skin tears noted. IV catheter discontinued intact. Site without signs and symptoms of complications. Dressing and pressure applied. Pt denies pain at the site currently. No complaints noted.  Patient free of lines, drains, and wounds.   An After Visit Summary (AVS) was printed and given to the patient. Patient escorted via wheelchair, and discharged home via private auto.  West Carbo, RN

## 2020-10-09 NOTE — Op Note (Signed)
[facility] Patient Name: Jared [name] Procedure Date : 10/09/2020 MRN: [id number] Attending MD: Wilhemina Bonito. Marina Goodell , MD Date of Birth: 1955-09-01 CSN: [id number] Age: 65 Admit Type: Inpatient Procedure:                Small bowel enteroscopy Indications:              Iron deficiency anemia. Worsening anemia from                            previous. History of AVMs. No overt GI bleeding.                            Had presyncopal episode. Providers:                Wilhemina Bonito. Marina Goodell, MD, Rogue Jury, RN, Arlee Muslim Tech., Technician, Arva Chafe, CRNA Referring MD:             [facility] hospitalist Medicines:                Monitored Anesthesia Care Complications:            No immediate complications. Estimated Blood Loss:     Estimated blood loss: none. Procedure:                Pre-Anesthesia Assessment:                           - Prior to the procedure, a History and Physical                            was performed, and patient medications and                            allergies were reviewed. The patient's tolerance of                            previous anesthesia was also reviewed. The risks                            and benefits of the procedure and the sedation                            options and risks were discussed with the patient.                            All questions were answered, and informed consent                            was obtained. Prior Anticoagulants: The patient has                            taken Xarelto (rivaroxaban), last dose was 3 days  prior to procedure. ASA Grade Assessment: III - A                            patient with severe systemic disease. After                            reviewing the risks and benefits, the patient was                            deemed in satisfactory condition to undergo the                            procedure.                           After obtaining  informed consent, the endoscope was                            passed under direct vision. Throughout the                            procedure, the patient's blood pressure, pulse, and                            oxygen saturations were monitored continuously. The                            PCF-H190DL ([id number]) Olympus pediatric colonoscope                            was introduced through the mouth and advanced to                            the proximal jejunum. The small bowel enteroscopy                            was accomplished without difficulty. The patient                            tolerated the procedure well. Scope In: Scope Out: Findings:      The esophagus was normal.      The stomach was normal, including retroflexion.      The examined duodenum was normal.      There was no evidence of significant pathology in the entire examined       portion of jejunum. Impression:               1. Normal small bowel enteroscopy to the proximal                            jejunum                           2. Iron deficiency anemia  3. Suspect iron deficiency secondary to                            intermittent mucosal oozing in the patient on                            chronic oral anticoagulation therapy                           4. He has not had adequate iron replacement                            therapy, thus iron deficiency anemia                           . Moderate Sedation:      none Recommendation:           1. I think this patient would benefit from IV iron                            infusion prior to discharge                           2. He should take iron sulfate 325 mg twice daily                            indefinitely                           3. It may be a good idea to have him followed up by                            hematology. They can monitor his blood counts and                            administer IV iron as needed                            No further plans from GI perspective. Okay for                            discharge when primary service has completed their                            work-up of non-GI issues. I have discussed this                            with the patient. We will sign off.                           . Procedure Code(s):        --- Professional ---                           [phone], Small intestinal  endoscopy, enteroscopy                            beyond second portion of duodenum, not including                            ileum; diagnostic, including collection of                            specimen(s) by brushing or washing, when performed                            (separate procedure) Diagnosis Code(s):        --- Professional ---                           D50.9, Iron deficiency anemia, unspecified CPT copyright 2019 American Medical Association. All rights reserved. The codes documented in this report are preliminary and upon coder review may  be revised to meet current compliance requirements. Wilhemina Bonito. Marina Goodell, MD 10/09/2020 1:06:43 PM This report has been signed electronically. Number of Addenda: 0

## 2020-10-09 NOTE — Discharge Instructions (Signed)
To Jared [name],   It was a pleasure taking care of you during your hospital stay. During your stay you were treated for low blood counts. We gave you blood to increase those levels, and an iron infusion to help your body make its own red blood cells. You also underwent a procedure to assess for bleeding in your stomach which came back normal. Please follow up with your primary care provider for an appointment this upcoming week. If you have any symptoms such as dizziness, light headedness, heart palpitations, having bloody bowel movements, please go to the nearest ED to be assessed.   [employer] medicine (25th ed., pp. [phone]). [place], [place]: Elsevier.">  Anemia  Anemia is a condition in which there is not enough red blood cells or hemoglobin in the blood. Hemoglobin is a substance in red blood cells that carries oxygen. When you do not have enough red blood cells or hemoglobin (are anemic), your body cannot get enough oxygen and your organs may not work properly. As a result, you may feel very tired or have other problems. What are the causes? Common causes of anemia include:  Excessive bleeding. Anemia can be caused by excessive bleeding inside or outside the body, including bleeding from the intestines or from heavy menstrual periods in females.  Poor nutrition.  Long-lasting (chronic) kidney, thyroid, and liver disease.  Bone marrow disorders, spleen problems, and blood disorders.  Cancer and treatments for cancer.  HIV (human immunodeficiency virus) and AIDS (acquired immunodeficiency syndrome).  Infections, medicines, and autoimmune disorders that destroy red blood cells. What are the signs or symptoms? Symptoms of this condition include:  Minor weakness.  Dizziness.  Headache, or difficulties concentrating and sleeping.  Heartbeats that feel irregular or faster than normal (palpitations).  Shortness of breath, especially with exercise.  Pale skin, lips, and nails,  or cold hands and feet.  Indigestion and nausea. Symptoms may occur suddenly or develop slowly. If your anemia is mild, you may not have symptoms. How is this diagnosed? This condition is diagnosed based on blood tests, your medical history, and a physical exam. In some cases, a test may be needed in which cells are removed from the soft tissue inside of a bone and looked at under a microscope (bone marrow biopsy). Your health care provider may also check your stool (feces) for blood and may do additional testing to look for the cause of your bleeding. Other tests may include:  Imaging tests, such as a CT scan or MRI.  A procedure to see inside your esophagus and stomach (endoscopy).  A procedure to see inside your colon and rectum (colonoscopy). How is this treated? Treatment for this condition depends on the cause. If you continue to lose a lot of blood, you may need to be treated at a hospital. Treatment may include:  Taking supplements of iron, vitamin V40, or folic acid.  Taking a hormone medicine (erythropoietin) that can help to stimulate red blood cell growth.  Having a blood transfusion. This may be needed if you lose a lot of blood.  Making changes to your diet.  Having surgery to remove your spleen. Follow these instructions at home:  Take over-the-counter and prescription medicines only as told by your health care provider.  Take supplements only as told by your health care provider.  Follow any diet instructions that you were given by your health care provider.  Keep all follow-up visits as told by your health care provider. This is important. Contact a health  care provider if:  You develop new bleeding anywhere in the body. Get help right away if:  You are very weak.  You are short of breath.  You have pain in your abdomen or chest.  You are dizzy or feel faint.  You have trouble concentrating.  You have bloody stools, black stools, or tarry  stools.  You vomit repeatedly or you vomit up blood. These symptoms may represent a serious problem that is an emergency. Do not wait to see if the symptoms will go away. Get medical help right away. Call your local emergency services (911 in the [place] Do not drive yourself to the hospital. Summary  Anemia is a condition in which you do not have enough red blood cells or enough of a substance in your red blood cells that carries oxygen (hemoglobin).  Symptoms may occur suddenly or develop slowly.  If your anemia is mild, you may not have symptoms.  This condition is diagnosed with blood tests, a medical history, and a physical exam. Other tests may be needed.  Treatment for this condition depends on the cause of the anemia. This information is not intended to replace advice given to you by your health care provider. Make sure you discuss any questions you have with your health care provider. Document Revised: 05/05/2019 Document Reviewed: 05/05/2019 Elsevier Patient Education  2021 [employer].

## 2020-10-10 ENCOUNTER — Encounter (HOSPITAL_COMMUNITY): Payer: Self-pay | Admitting: Internal Medicine

## 2020-10-10 ENCOUNTER — Other Ambulatory Visit: Payer: Self-pay

## 2020-10-10 ENCOUNTER — Telehealth: Payer: Self-pay

## 2020-10-10 NOTE — Telephone Encounter (Signed)
Transition Care Management Unsuccessful Follow-up Telephone Call  Date of discharge and from where:  10/09/2020, [facility]  Attempts:  1st Attempt  Reason for unsuccessful TCM follow-up call:  Left voice message on # [phone]. Call back requested to this CM.  He has an appointment with Dr Alvis Lemmings at [facility] -  11/23/2020. Need to inquire if he would like to schedule an appointment to be seen sooner by Dr Alvis Lemmings or another provider.

## 2020-10-10 NOTE — Patient Outreach (Signed)
[facility]) Care Management  10/10/2020  Jared [name] July 11, 1955 [id number]     Transition of Care Referral  Referral Date: 10/10/2020  Referral Source: [facility] Discharge Report Date of Discharge: 10/09/2020 Facility: [facility]     Referral received. Transition of care calls being completed via EMMI-automated calls.     Plan: RN CM will close referral at this time.    Antionette Fairy, RN,BSN,CCM [facility] Care Management Telephonic Care Management Coordinator Direct Phone: [phone] Toll Free: [phone] Fax: [phone]

## 2020-10-11 ENCOUNTER — Telehealth: Payer: Self-pay

## 2020-10-11 ENCOUNTER — Other Ambulatory Visit: Payer: Self-pay

## 2020-10-11 MED FILL — Budesonide-Glycopyrrolate-Formoterol Aers 160-9-4.8 MCG/ACT: RESPIRATORY_TRACT | 30 days supply | Qty: 10.7 | Fill #1 | Status: AC

## 2020-10-11 NOTE — Anesthesia Postprocedure Evaluation (Signed)
Anesthesia Post Note  Patient: Jared [name]  Procedure(s) Performed: ENTEROSCOPY (N/A )     Patient location during evaluation: PACU Anesthesia Type: MAC Level of consciousness: awake and alert Pain management: pain level controlled Vital Signs Assessment: post-procedure vital signs reviewed and stable Respiratory status: spontaneous breathing, nonlabored ventilation, respiratory function stable and patient connected to nasal cannula oxygen Cardiovascular status: stable and blood pressure returned to baseline Postop Assessment: no apparent nausea or vomiting Anesthetic complications: no   No complications documented.              Effie Berkshire

## 2020-10-11 NOTE — Telephone Encounter (Signed)
From the discharge call.    He said he is feeling all right except for a little " nagging" in his stomach.  He said it feels like heartburn and he is attributing it to the enteroscopy   He thinks he may need a referral to GI   he said he has all medications and did not have any questions about the med regime   He already has a nebulizer but said that he needs a new mouthpiece. He said that he does not need to use the machine very often. Explained to him that he can pick up a new mouthpiece at [facility] if he is not able to get one from his DME company.   Appointment with Dr Alvis Lemmings 11/14/2020. Offered him the option of seeing another provider at another clinic in order to be seen sooner but he refused.  He wanted to stay with Dr Alvis Lemmings

## 2020-10-11 NOTE — Telephone Encounter (Signed)
Patient called back to speak with Erskine Squibb but she was not available.  Please call patient back at [phone]

## 2020-10-11 NOTE — Telephone Encounter (Signed)
Transition Care Management Follow-up Telephone Call  Date of discharge and from where: 10/09/2020, [facility]  How have you been since you were released from the hospital? He said he is feeling all right except for a little " nagging" in his stomach.  He said it feels like heartburn and he is attributing it to the enteroscopy  Any questions or concerns? Yes - noted above.  He thinks he may need a referral to GI  Items Reviewed:  Did the pt receive and understand the discharge instructions provided? Yes   Medications obtained and verified? Yes  - he said he has all medications and did not have any questions about the med regime  Other? No   Any new allergies since your discharge? No   Do you have support at home? he said that he lives alone but has 2 sisters in the area who check on him  Home Care and Equipment/Supplies: Were home health services ordered? no If so, what is the name of the agency? n/a  Has the agency set up a time to come to the patient's home? not applicable Were any new equipment or medical supplies ordered?  No What is the name of the medical supply agency? n/a Were you able to get the supplies/equipment? not applicable Do you have any questions related to the use of the equipment or supplies? No   He already has a nebulizer but said that he needs a new mouthpiece. He said that he does not need to use the machine very often. Explained to him that he can pick up a new mouthpiece at [facility] if he is not able to get one from his DME company.  He has O2 that he only uses when needed.  He has the home fill system, not a POC.   Functional Questionnaire: (I = Independent and D = Dependent) ADLs: independent    Follow up appointments reviewed:   PCP Hospital f/u appt confirmed? Yes  - Dr Alvis Lemmings 11/14/2020. Offered him the option of seeing another provider at another clinic in order to be seen sooner but he refused.  He wanted to stay with Dr Alvis Lemmings,    [facility] f/u appt confirmed? cardiollogy - 01/30/2021; pulmonary 04/05/2021.   Are transportation arrangements needed? No   If their condition worsens, is the pt aware to call PCP or go to the Emergency Dept.? Yes  Was the patient provided with contact information for the PCP's office or ED? Yes  Was to pt encouraged to call back with questions or concerns? Yes

## 2020-10-11 NOTE — Discharge Summary (Addendum)
Name: Sheena Simonis MRN: [id number] DOB: Feb 17, 1956 65 y.o. PCP: [name], MD  Date of Admission: 10/07/2020  8:52 AM Date of Discharge: 10/09/2020 Attending Physician: Erlinda Hong MD Discharge Diagnosis: 1. Syncope 2/2 Symptomatic Anemia 2. NICM, HFrEF CAD 3. COPD Exacerbation 4. Paroxsymal Atrial Flutter   Discharge Medications: Allergies as of 10/09/2020       Reactions   Lisinopril Cough        Medication List     STOP taking these medications    azithromycin 250 MG tablet Commonly known as: ZITHROMAX   DULoxetine 60 MG capsule Commonly known as: CYMBALTA   predniSONE 10 MG tablet Commonly known as: DELTASONE   Xarelto 20 MG Tabs tablet Generic drug: rivaroxaban       TAKE these medications    albuterol (2.5 MG/3ML) 0.083% nebulizer solution Commonly known as: PROVENTIL Take 3 mLs (2.5 mg total) by nebulization every 4 (four) hours as needed for wheezing or shortness of breath.   albuterol 108 (90 Base) MCG/ACT inhaler Commonly known as: VENTOLIN HFA INHALE 2 PUFFS INTO THE LUNGS EVERY 6 (SIX) HOURS AS NEEDED FOR WHEEZING OR SHORTNESS OF BREATH.   atorvastatin 80 MG tablet Commonly known as: LIPITOR TAKE 1 TABLET EVERY DAY   Breztri Aerosphere 160-9-4.8 MCG/ACT Aero Generic drug: Budeson-Glycopyrrol-Formoterol INHALE 2 PUFFS INTO THE LUNGS 2 (TWO) TIMES DAILY.   carvedilol 3.125 MG tablet Commonly known as: COREG TAKE 1 TABLET (3.125 MG TOTAL) BY MOUTH 2 (TWO) TIMES DAILY. What changed: how much to take   ferrous sulfate 324 MG Tbec Take 1 tablet (324 mg total) by mouth daily.   fluticasone 50 MCG/ACT nasal spray Commonly known as: FLONASE Place 1 spray into both nostrils daily for 30 days.   furosemide 40 MG tablet Commonly known as: LASIX TAKE 1 TABLET EVERY DAY   gabapentin 300 MG capsule Commonly known as: NEURONTIN TAKE 1 CAPSULE (300 MG TOTAL) BY MOUTH AT BEDTIME. What changed: how much to take   Misc. Devices  Misc Portable oxygen concentrator. Diagnosis COPD.   nitroGLYCERIN 0.4 MG SL tablet Commonly known as: NITROSTAT PLACE 1 TABLET (0.4 MG TOTAL) UNDER THE TONGUE EVERY 5 (FIVE) MINUTES AS NEEDED FOR CHEST PAIN. CALL 911 IF SYMPTOMS DO NOT RESOLVE AFTER 3 MINUTES What changed:  how much to take how to take this when to take this reasons to take this   pantoprazole 40 MG tablet Commonly known as: PROTONIX Take 1 tablet (40 mg total) by mouth 2 (two) times daily before a meal.        Disposition and follow-up:   Mr.Jimi Shaff was discharged from [facility] in Stable condition.  At the hospital follow up visit please address:  1. Syncope 2/2 Symptomatic Anemia  - Consider 2nd Feraheme infusion  - Protonix 40 mg BID  - Ferrous sulfate supplementation 2. NICM, HFrEF CAD  - Continue Coreg 3.125 mg BID, atorvastatin 80 mg daily  - Furosemide 40 mg daily 3. COPD Exacerbation   - Continue Breztri and albuterol  4. Paroxsymal Atrial Flutter  - Continue Coreg 3.125 mg BID  - Hold Xarelto to discuss with PCP on follow up appointment.   2.  Labs / imaging needed at time of follow-up: CBC  3.  Pending labs/ test needing follow-up: None  Follow-up Appointments:  Follow-up Information     [name], MD. Schedule an appointment as soon as possible for a visit in 1 week(s).   Specialty: Family Medicine Why: Make  an appointment in one week for hospital follow up. Contact information: [address] [place] [address] [phone]         Pricilla Riffle, MD .   Specialty: Cardiology Contact information: [address] [place] [address] [phone]                 Hospital Course by problem list: 1. 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 During their ED evaluation, it was found that their Hgb dropped from 10.4 a month ago to 7.6  at admission. He was given one unit of packed red blood cells, started on Protonix, and GI consulted. GI performed a small bowel endoscopy with normal findings. Patient was given a feraheme infusion before discharge. He was stable, with no more syncopal episodes. His Xarelto was held, and he will continue with his iron supplementation. Etiology of the bleeding is likely a slow mucosal bleed, increased risk due to anticoagulation, but overall low risk for life threatening bleed in the future. We recommend discussion about resuming anticoagulation at his follow up appointment assuming his anemia/bleeding is stable or resolved. He may require intermittent outpatient infusions of feraheme in order to prevent future episodes of symptomatic anemia.   2. NICM, HFrEF CAD Patient was euvolemic on physical examination. He was maintained with his home dose of Coreg and atorvastatin.    3. COPD Exacerbation Patient was completing a course of azithromycin and prednisone for a mild COPD exacerbation before his admission. He completed his course during his stay, and was stable. He was continued on his home regimen, and discharged in stable condition.   4. Paroxsymal Atrial Flutter Patient with a Hx of PAF presented with concerns for lower GI bleed and on xarelto. His Xarelto was held during admission due to his symptomatic anemia and was rate controlled during his stay. Xarelto was held at discharge for further discussion with his PCP, as this is his second admission for symptomatic anemia.   Discharge Exam:   BP (!) 151/95 (BP Location: Left Arm)   Pulse 80   Temp 99 F (37.2 C) (Oral)   Resp 16   Ht 6\' 3"  (1.905 m)   Wt 98.4 kg   SpO2 96%   BMI 27.12 kg/m  Discharge exam:  Physical Exam Constitutional:      General: He is not in acute distress.    Appearance: Normal appearance.  Cardiovascular:     Pulses: Normal pulses.     Heart sounds: Normal heart sounds. No murmur heard. No friction rub. No  gallop.   Pulmonary:     Effort: Pulmonary effort is normal.     Breath sounds: Normal breath sounds. No wheezing, rhonchi or rales.  Abdominal:     General: Abdomen is flat. Bowel sounds are normal.     Palpations: Abdomen is soft.     Tenderness: There is no abdominal tenderness.  Neurological:     Mental Status: He is alert and oriented to person, place, and time.  Psychiatric:        Behavior: Behavior normal.     Pertinent Labs, Studies, and Procedures:  Component Ref Range & Units 3 d ago  (10/09/20) 4 d ago  (10/08/20) 5 d ago  (10/07/20) 5 d ago  (10/07/20) 2 mo ago  (08/09/20) 5 mo ago  (05/10/20) 6 mo ago  (04/06/20)  WBC 4.0 - 10.5 K/uL 10.5  8.6  8.9  12.3 High   6.6  5.4 R  6.8 R   RBC 4.22 - 5.81 MIL/uL 3.91 Low   3.74 Low   3.90 Low   3.83 Low   4.57 R  5.07 R  4.19 R   Hemoglobin 13.0 - 17.0 g/dL 8.2 Low   7.8 Low   8.2 Low   7.6 Low   10.4 Repeated and verified X2. Low   11.3 Low  R  8.8 Low  R   HCT 39.0 - 52.0 % 32.4 Low   30.7 Low   32.2 Low   31.1 Low   35.4 Repeated and verified X2. Low   40.9 R  32.5 Low  R   MCV 80.0 - 100.0 fL 82.9  82.1  82.6  81.2  77.6 Low  R  81 R  78 Low  R   MCH 26.0 - 34.0 pg 21.0 Low   20.9 Low   21.0 Low   19.8 Low    22.3 Low  R  21.0 Low  R   MCHC 30.0 - 36.0 g/dL 42.8 Low   76.8 Low   11.5 Low   24.4 Low   29.4 Repeated and verified X2. Low   27.6 Low  R  27.1 Low  R   RDW 11.5 - 15.5 % 21.2 High   21.3 High   21.9 High   22.6 High   19.6 High   17.6 High  R  19.5 High  R   Platelets 150 - 400 K/uL 228  236  225  295  254.0 R  199 R  188 R   Comment: REPEATED TO VERIFY  nRBC 0.0 - 0.2 % 0.4 High   0.6 High  CM  0.7 High  CM  0.6 High     1 High     BMP Latest Ref Rng & Units 10/09/2020 10/08/2020 10/07/2020  Glucose 70 - 99 mg/dL 726(O) 035(D) 974(B)  BUN 8 - 23 mg/dL 12 11 13   Creatinine 0.61 - 1.24 mg/dL 6.38 4.53 6.46  BUN/Creat Ratio 10 - 24 - - -  Sodium 135 - 145 mmol/L 141 141 141  Potassium 3.5 - 5.1 mmol/L 4.4 4.3 3.7   Chloride 98 - 111 mmol/L 100 101 100  CO2 22 - 32 mmol/L 36(H) 32 31  Calcium 8.9 - 10.3 mg/dL 9.1 8.0(H) 9.4   Procedure: Small bowel enteroscopy Iron deficiency anemia. Worsening anemia from previous. History of AVMs. No overt GI bleeding. Had presyncopal episode. Indications: Wilhemina Bonito. Marina Goodell, MD, Rogue Jury, RN, Arlee Muslim Tech., Technician, Arva Chafe, CRNA Providers: Referring MD: [facility] hospitalist Medicines: Monitored Anesthesia Care Complications: No immediate complications. Estimated Blood Loss: Estimated blood loss: none. Pre-Anesthesia Assessment: - Prior to the procedure, a History and Physical was performed, and patient medications and allergies were reviewed. The patient's tolerance of previous anesthesia was also reviewed. The risks and benefits of the procedure and the sedation options and risks were discussed with the patient. All questions were answered, and informed consent was obtained. Prior Anticoagulants: The patient has taken Xarelto (rivaroxaban), last dose was 3 days prior to procedure. ASA Grade Assessment: III - A patient with severe systemic disease. After reviewing the risks and benefits, the patient was deemed in satisfactory condition to undergo the procedure. After obtaining informed consent, the endoscope was passed under direct vision. Throughout the procedure, the patient's blood pressure, pulse, and oxygen saturations were monitored continuously. The PCF-[id number] ([id number] Olympus pediatric colonoscope was  introduced through the mouth and advanced to the proximal jejunum. The small bowel enteroscopy was accomplished without difficulty. The patient tolerated the procedure well. Procedure: The esophagus was normal. Findings: The stomach was normal, including retroflexion. The examined duodenum was normal. There was no evidence of significant pathology in the entire examined portion of jejunum. 1. Normal small bowel enteroscopy to the  proximal jejunum 2. Iron deficiency anemia 3. Suspect iron deficiency secondary to intermittent mucosal oozing in the patient on chronic oral anticoagulation therapy 4. He has not had adequate iron replacement therapy, thus iron deficiency anemia Moderate Sedation: 1. I think this patient would benefit from IV iron infusion prior to discharge 2. He should take iron sulfate 325 mg twice daily indefinitely 3. It may be a good idea to have him followed up by hematology. They can monitor his blood counts and administer IV iron as needed No further plans from GI perspective. Okay for discharge when primary service has completed their work-up of non-GI issues. I have discussed this with the patient. We will sign off. .   Discharge Instructions: Discharge Instructions     Diet - low sodium heart healthy   Complete by: As directed    Increase activity slowly   Complete by: As directed        Signed: Dolan Amen, MD 10/11/2020, 2:49 PM   Pager: [phone]

## 2020-10-13 ENCOUNTER — Telehealth: Payer: Self-pay

## 2020-10-13 ENCOUNTER — Other Ambulatory Visit: Payer: Self-pay

## 2020-10-13 NOTE — Telephone Encounter (Signed)
Call returned to patient, message left with call back requested to this CM. 

## 2020-10-18 NOTE — Telephone Encounter (Signed)
Attempted to contact patient again # [phone], message left with call back requested to this CM

## 2020-10-19 ENCOUNTER — Other Ambulatory Visit: Payer: Self-pay

## 2020-10-19 MED FILL — Carvedilol Tab 3.125 MG: ORAL | 90 days supply | Qty: 180 | Fill #0 | Status: AC

## 2020-10-20 ENCOUNTER — Other Ambulatory Visit: Payer: Self-pay

## 2020-10-23 DIAGNOSIS — I5043 Acute on chronic combined systolic (congestive) and diastolic (congestive) heart failure: Secondary | ICD-10-CM | POA: Diagnosis not present

## 2020-10-31 ENCOUNTER — Other Ambulatory Visit: Payer: Self-pay

## 2020-10-31 MED FILL — Nitroglycerin SL Tab 0.4 MG: SUBLINGUAL | 25 days supply | Qty: 25 | Fill #0 | Status: AC

## 2020-10-31 MED FILL — Gabapentin Cap 300 MG: ORAL | 30 days supply | Qty: 30 | Fill #1 | Status: AC

## 2020-11-01 ENCOUNTER — Emergency Department (HOSPITAL_COMMUNITY): Payer: Medicare HMO

## 2020-11-01 ENCOUNTER — Emergency Department (HOSPITAL_COMMUNITY)
Admission: EM | Admit: 2020-11-01 | Discharge: 2020-11-01 | Disposition: A | Discharge status: Home / Self Care | Payer: Medicare HMO | Attending: Emergency Medicine | Admitting: Emergency Medicine

## 2020-11-01 ENCOUNTER — Other Ambulatory Visit: Payer: Self-pay

## 2020-11-01 ENCOUNTER — Encounter (HOSPITAL_COMMUNITY): Payer: Self-pay | Admitting: Emergency Medicine

## 2020-11-01 DIAGNOSIS — Z79899 Other long term (current) drug therapy: Secondary | ICD-10-CM | POA: Insufficient documentation

## 2020-11-01 DIAGNOSIS — I480 Paroxysmal atrial fibrillation: Secondary | ICD-10-CM | POA: Insufficient documentation

## 2020-11-01 DIAGNOSIS — Z7901 Long term (current) use of anticoagulants: Secondary | ICD-10-CM | POA: Insufficient documentation

## 2020-11-01 DIAGNOSIS — F17210 Nicotine dependence, cigarettes, uncomplicated: Secondary | ICD-10-CM | POA: Insufficient documentation

## 2020-11-01 DIAGNOSIS — I110 Hypertensive heart disease with heart failure: Secondary | ICD-10-CM | POA: Diagnosis not present

## 2020-11-01 DIAGNOSIS — J811 Chronic pulmonary edema: Secondary | ICD-10-CM | POA: Diagnosis not present

## 2020-11-01 DIAGNOSIS — R609 Edema, unspecified: Secondary | ICD-10-CM | POA: Diagnosis not present

## 2020-11-01 DIAGNOSIS — J441 Chronic obstructive pulmonary disease with (acute) exacerbation: Secondary | ICD-10-CM | POA: Diagnosis not present

## 2020-11-01 DIAGNOSIS — I517 Cardiomegaly: Secondary | ICD-10-CM | POA: Diagnosis not present

## 2020-11-01 DIAGNOSIS — I5042 Chronic combined systolic (congestive) and diastolic (congestive) heart failure: Secondary | ICD-10-CM | POA: Insufficient documentation

## 2020-11-01 DIAGNOSIS — I2510 Atherosclerotic heart disease of native coronary artery without angina pectoris: Secondary | ICD-10-CM | POA: Insufficient documentation

## 2020-11-01 DIAGNOSIS — R0602 Shortness of breath: Secondary | ICD-10-CM | POA: Diagnosis not present

## 2020-11-01 LAB — BASIC METABOLIC PANEL
Anion gap: 7 (ref 5–15)
BUN: 13 mg/dL (ref 8–23)
CO2: 32 mmol/L (ref 22–32)
Calcium: 8.8 mg/dL — ABNORMAL LOW (ref 8.9–10.3)
Chloride: 102 mmol/L (ref 98–111)
Creatinine, Ser: 0.9 mg/dL (ref 0.61–1.24)
GFR, Estimated: >60 mL/min (ref >60)
Glucose, Bld: 113 mg/dL — ABNORMAL HIGH (ref 70–99)
Potassium: 3.8 mmol/L (ref 3.5–5.1)
Sodium: 141 mmol/L (ref 135–145)

## 2020-11-01 LAB — TROPONIN I (HIGH SENSITIVITY)
Troponin I (High Sensitivity): 27 ng/L — ABNORMAL HIGH (ref <18)
Troponin I (High Sensitivity): 31 ng/L — ABNORMAL HIGH (ref <18)

## 2020-11-01 LAB — BRAIN NATRIURETIC PEPTIDE: B Natriuretic Peptide: 700.5 pg/mL — ABNORMAL HIGH (ref 0.0–100.0)

## 2020-11-01 LAB — CBC
HCT: 43.4 % (ref 39.0–52.0)
Hemoglobin: 11 g/dL — ABNORMAL LOW (ref 13.0–17.0)
MCH: 22.7 pg — ABNORMAL LOW (ref 26.0–34.0)
MCHC: 25.3 g/dL — ABNORMAL LOW (ref 30.0–36.0)
MCV: 89.7 fL (ref 80.0–100.0)
Platelets: 434 10*3/uL — ABNORMAL HIGH (ref 150–400)
RBC: 4.84 MIL/uL (ref 4.22–5.81)
RDW: 22.9 % — ABNORMAL HIGH (ref 11.5–15.5)
WBC: 8.5 10*3/uL (ref 4.0–10.5)
nRBC: 0 % (ref 0.0–0.2)

## 2020-11-01 MED ORDER — PREDNISONE 20 MG PO TABS
40.0000 mg | ORAL_TABLET | Freq: Every day | ORAL | 0 refills | Status: AC
  Filled 2020-11-01: qty 10, 5d supply, fill #0

## 2020-11-01 MED ORDER — METHYLPREDNISOLONE SODIUM SUCC 125 MG IJ SOLR
125.0000 mg | Freq: Once | INTRAMUSCULAR | Status: AC
  Administered 2020-11-01: 125 mg via INTRAVENOUS
  Filled 2020-11-01: qty 2

## 2020-11-01 MED ORDER — IPRATROPIUM-ALBUTEROL 0.5-2.5 (3) MG/3ML IN SOLN
3.0000 mL | Freq: Once | RESPIRATORY_TRACT | Status: AC
  Administered 2020-11-01: 3 mL via RESPIRATORY_TRACT
  Filled 2020-11-01: qty 3

## 2020-11-01 MED ORDER — MAGNESIUM SULFATE 2 GM/50ML IV SOLN
2.0000 g | Freq: Once | INTRAVENOUS | Status: AC
  Administered 2020-11-01: 2 g via INTRAVENOUS
  Filled 2020-11-01: qty 50

## 2020-11-01 MED ORDER — FUROSEMIDE 10 MG/ML IJ SOLN
40.0000 mg | Freq: Once | INTRAMUSCULAR | Status: AC
  Administered 2020-11-01: 40 mg via INTRAVENOUS
  Filled 2020-11-01: qty 4

## 2020-11-01 MED ORDER — ALBUTEROL SULFATE (2.5 MG/3ML) 0.083% IN NEBU
2.5000 mg | INHALATION_SOLUTION | RESPIRATORY_TRACT | 0 refills | Status: DC | PRN
  Filled 2020-11-01: qty 75, 5d supply, fill #0

## 2020-11-01 NOTE — ED Triage Notes (Signed)
Pt reports increasing SOb since yesterday. Pt with audible wheezing and accessory muscle use. PT wearing 3L O2. Pt with hx of CHF reports lower extremity edema.

## 2020-11-01 NOTE — Discharge Instructions (Signed)
It was wonderful to see you.  I hope that you feel so much better.  I have sent in refills of your albuterol and sent in a prednisone course for your breathing.  Recommend increasing your Lasix to 40 mg twice daily for the next 2-3 days to help get some extra fluid off.  Please follow-up with your primary provider in the next few days if possible.  You should schedule your albuterol nebulizer every 4 hours for the next 24 hours and then can ease off from there.  Please use your oxygen at home if your oxygen saturation is at or below 88%.  If your shortness of breath continues to get worse or you have any persistent chest pain, passing out episodes please return to the ED.

## 2020-11-01 NOTE — ED Provider Notes (Signed)
[facility] EMERGENCY DEPARTMENT Provider Note   CSN: [id number] Arrival date & time: 11/01/20  1950     History Chief Complaint  Patient presents with  . Shortness of Breath  . Edema    Jared [name] is a 65 y.o. male, with a complex medical history including paroxysmal a flutter previously on Xarelto (recently discontinued due to second episode of syncope with symptomatic anemia), NICM, CAD, COPD, and CVA, presenting for evaluation of acute shortness of breath since yesterday.  States he initially started feeling short of breath yesterday afternoon, could hear himself audibly wheezing.  At baseline he has oxygen at home but has not had to use it as his pulse ox has been appropriate, however he checked yesterday and it was "8 something," so he placed himself on 3 LNC.  Baseline cough with sputum production, however has increased in frequency and amount of sputum production since onset of shortness of breath.  Sputum is not thicker than usual.  Endorses intermittent chest pain, low chest and pressure-like that is nonpainful to him, states this is been present for the past 20 years and has not increased in frequency or severity.  Noted some bilateral lower extremity swelling since this morning, denies any orthopnea or PND.  Denies any fever, chills, or known sick contacts.  He has had 3 doses of the [employer] vaccine.   He recently was admitted to internal medicine from 4/29-5/1 due to symptomatic anemia.  Received 1 unit PRBCs total, hemoglobin at discharge 8.5.  GI performed a small bowel endoscopy with normal findings and his Xarelto was held at discharge with possible restart by PCP on follow-up if remaining stable.  He has not been able to see his PCP, appointment was not available until later this summer.  Still not taking Xarelto.       Past Medical History:  Diagnosis Date  . AVM (arteriovenous malformation)   . CAD (coronary artery disease)    a. LHC 5/12:   LAD 20, pLCx 20, pRCA 40, dRCA 40, EF 35%, diff HK  //  b. Myoview 4/16: Overall Impression: High risk stress nuclear study There is no evidence of ischemia. There is severe LV dysfunction. LV Ejection Fraction: 30%. LV Wall Motion: There is global LV hypokinesis.   Marland Kitchen CAP (community acquired pneumonia) 09/2013  . Chronic combined systolic and diastolic CHF (congestive heart failure) (HCC)    a. Echo 4/16:Mild LVH, EF 40-45%, diffuse HK //  b. Echo 8/17: EF 35-40%, diffuse HK, diastolic dysfunction, aortic sclerosis, trivial MR, moderate LAE, normal RVSF, moderate RAE, mild TR, PASP 42 mmHg // c. Echo 4/18: Mild concentric LVH, EF 30-35, normal wall motion, grade 1 diastolic dysfunction, PASP 49  . Chronic respiratory failure (HCC)   . Cluster headache    "hx; haven't had one in awhile" (01/09/2016)  . COPD (chronic obstructive pulmonary disease) (HCC)    [name] 01/09/2016  . History of CVA (cerebrovascular accident)   . Hypertension   . IDA (iron deficiency anemia)   . Moderate tobacco use disorder   . NICM (nonischemic cardiomyopathy) (HCC)   . Nicotine addiction   . Tobacco abuse     Patient Active Problem List   Diagnosis Date Noted  . Acute GI bleeding 06/11/2019  . Syncope 06/11/2019  . Chronic right maxillary sinusitis 01/29/2019  . Chronic respiratory failure with hypoxia and hypercapnia (HCC) 01/01/2019  . Acute on chronic respiratory failure with hypoxia and hypercapnia (HCC) 11/10/2018  . GI bleed  10/27/2018  . Chronic anticoagulation 09/27/2018  . AV malformation of gastrointestinal tract   . Occult GI bleeding   . Heme positive stool   . Iron deficiency anemia   . Acute on chronic anemia 09/04/2018  . Noncompliance 06/14/2017  . Elevated LFTs 08/13/2016  . Chronic respiratory failure with hypoxia (HCC) 07/24/2016  . Atrial flutter (HCC)   . Hepatic congestion 07/02/2016  . Chronic combined systolic and diastolic CHF (congestive heart failure) (HCC) 07/01/2016  .  COPD with acute exacerbation (HCC) 06/03/2016  . Prediabetes 05/14/2016  . Hemoptysis 05/06/2016  . COPD GOLD III/still smoking  02/29/2016  . Cigarette smoker 01/09/2016  . CAD (coronary artery disease) 10/07/2013  . 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 Centrilobular emphysema (HCC) 10/04/2013  . Essential hypertension     Past Surgical History:  Procedure Laterality Date  . CARDIAC CATHETERIZATION  10/2010   LM normal, LAD with 20% irregularities, LCX with 20%, RCA with 40% prox and 40% distal - EF of 35%  . CATARACT EXTRACTION, BILATERAL    . COLONOSCOPY W/ BIOPSIES AND POLYPECTOMY    . COLONOSCOPY WITH PROPOFOL N/A 09/06/2018   Procedure: COLONOSCOPY WITH PROPOFOL;  Surgeon: Tressia DanasBeavers, Kimberly, MD;  [facility];  Service: Gastroenterology;  Laterality: N/A;  . ENTEROSCOPY N/A 09/28/2018   Procedure: ENTEROSCOPY;  Surgeon: Jeani HawkingHung, Patrick, MD;  [facility];  Service: Endoscopy;  Laterality: N/A;  . ENTEROSCOPY N/A 10/28/2018   Procedure: ENTEROSCOPY;  Surgeon: Tressia DanasBeavers, Kimberly, MD;  [facility];  Service: Gastroenterology;  Laterality: N/A;  . ENTEROSCOPY N/A 10/09/2020   Procedure: ENTEROSCOPY;  Surgeon: Hilarie FredricksonPerry, John N, MD;  [facility];  Service: Endoscopy;  Laterality: N/A;  . ESOPHAGOGASTRODUODENOSCOPY (EGD) WITH PROPOFOL N/A 09/05/2018   Procedure: ESOPHAGOGASTRODUODENOSCOPY (EGD) WITH PROPOFOL;  Surgeon: Benancio DeedsArmbruster, Steven P, MD;  [facility];  Service: Gastroenterology;  Laterality: N/A;  . EXCISION MASS HEAD    . GIVENS CAPSULE STUDY N/A 09/06/2018   Procedure: GIVENS CAPSULE STUDY;  Surgeon: Tressia DanasBeavers, Kimberly, MD;  [facility];  Service: Gastroenterology;  Laterality: N/A;  . GIVENS CAPSULE STUDY N/A 09/26/2018   Procedure: GIVENS CAPSULE STUDY;  Surgeon: Beverley FiedlerPyrtle, Jay M, MD;  [facility];  Service: Gastroenterology;  Laterality: N/A;  . GIVENS CAPSULE STUDY N/A 06/14/2019   Procedure: GIVENS  CAPSULE STUDY;  Surgeon: Napoleon FormNandigam, Kavitha V, MD;  Location: [facility] ENDOSCOPY;  Service: Endoscopy;  Laterality: N/A;  . HOT HEMOSTASIS N/A 10/28/2018   Procedure: HOT HEMOSTASIS (ARGON PLASMA COAGULATION/BICAP);  Surgeon: Tressia DanasBeavers, Kimberly, MD;  [facility];  Service: Gastroenterology;  Laterality: N/A;  . INCISION AND DRAINAGE PERIRECTAL ABSCESS N/A 06/05/2017   Procedure: IRRIGATION AND DEBRIDEMENT PERIRECTAL ABSCESS;  Surgeon: Andria MeuseWhite, Christopher M, MD;  Location: [facility] OR;  Service: General;  Laterality: N/A;  . VIDEO BRONCHOSCOPY Bilateral 05/08/2016   Procedure: VIDEO BRONCHOSCOPY WITH FLUORO;  Surgeon: Oretha Milchakesh V Alva, MD;  Location: [facility] ENDOSCOPY;  Service: Cardiopulmonary;  Laterality: Bilateral;       Family History  Problem Relation Age of Onset  . Heart disease Mother   . Diabetes Mother   . Colon cancer Mother   . Liver cancer Mother   . Cancer Father        type unknown  . Diabetes Sister        x 2  . Diabetes Brother     Social History   Tobacco Use  . Smoking status: Current Some Day Smoker    Packs/day: 2.00  Years: 47.00    Pack years: 94.00    Types: Cigarettes  . Smokeless tobacco: Never Used  Vaping Use  . Vaping Use: Never used  Substance Use Topics  . Alcohol use: Not Currently    Alcohol/week: 0.0 standard drinks    Comment: last drink was before xmas  . Drug use: No    Types: Cocaine, Marijuana    Comment: "nothing in 20 years"    Home Medications Prior to Admission medications   Medication Sig Start Date End Date Taking? Authorizing Provider  albuterol (VENTOLIN HFA) 108 (90 Base) MCG/ACT inhaler INHALE 2 PUFFS INTO THE LUNGS EVERY 6 (SIX) HOURS AS NEEDED FOR WHEEZING OR SHORTNESS OF BREATH. 08/15/20 08/15/21 Yes Newlin, Odette Horns, MD  Budeson-Glycopyrrol-Formoterol 160-9-4.8 MCG/ACT AERO INHALE 2 PUFFS INTO THE LUNGS 2 (TWO) TIMES DAILY. 04/27/20 04/27/21 Yes Nyoka Cowden, MD  carvedilol (COREG) 3.125 MG tablet TAKE 1 TABLET (3.125 MG TOTAL)  BY MOUTH 2 (TWO) TIMES DAILY. Patient taking differently: Take 3.125 mg by mouth 2 (two) times daily. 06/14/20 06/14/21 Yes Newlin, Odette Horns, MD  ferrous sulfate 324 MG TBEC Take 1 tablet (324 mg total) by mouth daily. 03/31/20  Yes Pricilla Riffle, MD  furosemide (LASIX) 40 MG tablet TAKE 1 TABLET EVERY DAY Patient taking differently: Take 40 mg by mouth daily. 08/03/20  Yes Newlin, Odette Horns, MD  gabapentin (NEURONTIN) 300 MG capsule TAKE 1 CAPSULE (300 MG TOTAL) BY MOUTH AT BEDTIME. Patient taking differently: Take 300 mg by mouth at bedtime. 03/23/20 03/23/21 Yes Newlin, Odette Horns, MD  nitroGLYCERIN (NITROSTAT) 0.4 MG SL tablet PLACE 1 TABLET (0.4 MG TOTAL) UNDER THE TONGUE EVERY 5 (FIVE) MINUTES AS NEEDED FOR CHEST PAIN. CALL 911 IF SYMPTOMS DO NOT RESOLVE AFTER 3 MINUTES Patient taking differently: Place 0.4 mg under the tongue every 5 (five) minutes as needed for chest pain. 06/14/20 06/14/21 Yes [name], MD  predniSONE (DELTASONE) 20 MG tablet Take 2 tablets (40 mg total) by mouth daily with breakfast for 5 days. 11/01/20 11/06/20 Yes Zurisadai Helminiak N, DO  albuterol (PROVENTIL) (2.5 MG/3ML) 0.083% nebulizer solution Take 3 mLs (2.5 mg total) by nebulization every 4 (four) hours as needed for wheezing or shortness of breath. 11/01/20   Allayne Stack, DO  atorvastatin (LIPITOR) 80 MG tablet TAKE 1 TABLET EVERY DAY Patient not taking: Reported on 11/01/2020 08/03/20   [name], MD  fluticasone (FLONASE) 50 MCG/ACT nasal spray Place 1 spray into both nostrils daily for 30 days. Patient not taking: Reported on 10/07/2020 11/12/18 01/29/20  Burnadette Pop, MD  Misc. Devices MISC Portable oxygen concentrator. Diagnosis COPD. 12/05/18   [name], MD  pantoprazole (PROTONIX) 40 MG tablet Take 1 tablet (40 mg total) by mouth 2 (two) times daily before a meal. Patient not taking: Reported on 11/01/2020 06/15/19   Sunnie Nielsen, DO  XARELTO 20 MG TABS tablet Take 20 mg by mouth daily. 10/18/20    [provider]  TUDORZA PRESSAIR 400 MCG/ACT AEPB INHALE 1 PUFF INTO THE LUNGS IN THE MORNING AND AT BEDTIME. 03/29/20 04/04/20  Nyoka Cowden, MD    Allergies    Lisinopril  Review of Systems   Review of Systems  Constitutional: Negative for chills, diaphoresis, fatigue and fever.  HENT: Negative for congestion.   Respiratory: Positive for cough, shortness of breath and wheezing.   Cardiovascular: Positive for leg swelling. Negative for palpitations.  Gastrointestinal: Negative for abdominal pain, diarrhea, nausea and vomiting.  Genitourinary: Negative for dysuria.  Musculoskeletal: Negative  for gait problem.  Skin: Negative for wound.  Neurological: Negative for dizziness, syncope, weakness, light-headedness and numbness.    Physical Exam Updated Vital Signs BP (!) 179/111   Pulse 84   Temp 98.8 F (37.1 C) (Oral)   Resp (!) 22   Ht 6\' 3"  (1.905 m)   Wt 98.4 kg   SpO2 95%   BMI 27.12 kg/m   Physical Exam Constitutional:      General: He is not in acute distress.    Appearance: He is well-developed. He is not ill-appearing.  HENT:     Head: Normocephalic and atraumatic.  Eyes:     Extraocular Movements: Extraocular movements intact.     Pupils: Pupils are equal, round, and reactive to light.  Cardiovascular:     Rate and Rhythm: Normal rate. Rhythm irregular.     Pulses: Normal pulses.     Heart sounds: Normal heart sounds.  Pulmonary:     Comments: Diffuse expiratory wheeze throughout anterior/posterior lung fields.  No crackles appreciated.  Slight decrease in air exchange.  Mild respiratory difficulty with tachypnea and occasional accessory muscle use, RR 22.  Able to speak in full sentences without stopping. Abdominal:     Palpations: Abdomen is soft.     Tenderness: There is no abdominal tenderness.  Musculoskeletal:     Cervical back: Neck supple.     Comments: 1+ pitting edema to bilateral lower extremity to mid shin level.  Lower legs equal in  size comparatively.  Skin:    General: Skin is warm and dry.     Capillary Refill: Capillary refill takes less than 2 seconds.  Neurological:     General: No focal deficit present.     Mental Status: He is alert and oriented to person, place, and time.  Psychiatric:        Mood and Affect: Mood normal.        Behavior: Behavior normal.     ED Results / Procedures / Treatments   Labs (all labs ordered are listed, but only abnormal results are displayed) Labs Reviewed  BASIC METABOLIC PANEL - Abnormal; Notable for the following components:      Result Value   Glucose, Bld 113 (*)    Calcium 8.8 (*)    All other components within normal limits  CBC - Abnormal; Notable for the following components:   Hemoglobin 11.0 (*)    MCH 22.7 (*)    MCHC 25.3 (*)    RDW 22.9 (*)    Platelets 434 (*)    All other components within normal limits  BRAIN NATRIURETIC PEPTIDE - Abnormal; Notable for the following components:   B Natriuretic Peptide 700.5 (*)    All other components within normal limits  TROPONIN I (HIGH SENSITIVITY) - Abnormal; Notable for the following components:   Troponin I (High Sensitivity) 27 (*)    All other components within normal limits  TROPONIN I (HIGH SENSITIVITY) - Abnormal; Notable for the following components:   Troponin I (High Sensitivity) 31 (*)    All other components within normal limits    EKG EKG Interpretation  Date/Time:  Tuesday Nov 01 2020 09:11:08 EDT Ventricular Rate:  85 PR Interval:  132 QRS Duration: 90 QT Interval:  404 QTC Calculation: 480 R Axis:   242 Text Interpretation: Sinus rhythm with marked sinus arrhythmia Right superior axis deviation Pulmonary disease pattern Inferior infarct , age undetermined No significant change since last tracing Confirmed by [id number] (Gwyneth Sprout) on 11/01/2020  9:40:54 AM   Radiology DG Chest 2 View  Result Date: 11/01/2020 CLINICAL DATA:  Shortness of breath. EXAM: CHEST - 2 VIEW COMPARISON:   CT 01/18/2020.  Chest x-ray 06/11/2019. FINDINGS: Mediastinum and hilar structures normal. Cardiomegaly with mild pulmonary venous congestion. Mild interstitial prominence suggesting mild interstitial edema. No pleural effusion or pneumothorax. Left costophrenic angle incompletely imaged. No pneumothorax. IMPRESSION: Cardiomegaly mild pulmonary venous congestion. Mild interstitial prominence suggesting mild interstitial edema. Electronically Signed   By: Maisie Fus  Register   On: 11/01/2020 10:07    Procedures Procedures   Medications Ordered in ED Medications  ipratropium-albuterol (DUONEB) 0.5-2.5 (3) MG/3ML nebulizer solution 3 mL (3 mLs Nebulization Given 11/01/20 1217)  methylPREDNISolone sodium succinate (SOLU-MEDROL) 125 mg/2 mL injection 125 mg (125 mg Intravenous Given 11/01/20 1103)  magnesium sulfate IVPB 2 g 50 mL (0 g Intravenous Stopped 11/01/20 1215)  furosemide (LASIX) injection 40 mg (40 mg Intravenous Given 11/01/20 1310)    ED Course  I have reviewed the triage vital signs and the nursing notes.  Pertinent labs & imaging results that were available during my care of the patient were reviewed by me and considered in my medical decision making (see chart for details).  Clinical Course as of 11/01/20 1617  Tue Nov 01, 2020  1245 Reassessed patient after DuoNeb and Solu-Medrol.  No further wheezing on exam, improved air exchange.  Breathing comfortably currently on 3L [place].  States he is feeling a little bit better.  Will give IV Lasix for volume overload to further improve shortness of breath. [SB]    Clinical Course User Index [SB] Allayne Stack, DO   MDM Rules/Calculators/A&P                          65 year old gentleman history of COPD Gold stage D, nonischemic cardiomyopathy with EF 45-50% in 2019, paroxysmal atrial fibrillation not currently on anticoagulation, and recent blood loss anemia, presenting for evaluation of shortness of breath, wheezing, and increased edema  since yesterday.  Diffuse expiratory wheezing with mild tachypnea noted on exam, 1+ pitting edema to midshin bilaterally.  Initially using 3L on arrival, intermittently able to decrease down to 0.5L maintaining above 91%, however desaturates while sleeping in the ED (h/o OSA).  Suspect may be multifactorial in the setting of COPD (predominant) and acute on chronic HFmrEF.  Could also consider contributory worsening anemia, will check CBC.  While he currently is not on anticoagulation, low suspicion for PE, Wells score for PE 0.  CXR, BNP, troponin, BMP ordered.  Will give IV Solu-Medrol, mag, and DuoNeb treatment and reassess.  BNP 700, above the baseline around 200-300 previously.  Troponin 27>31, around baseline.  Hemoglobin improved from recent hospital discharge, 11 from previously 8.5. Cr and electrolytes wnl.  CXR with mild interstitial edema.  EKG sinus with arrhythmia, unchanged from previous.  Overall findings additionally consistent with acute on chronic HFmrEF (+COPD as above), given IV Lasix 40 mg with significant diuresis.  On reassessment, wheezing and air exchange have improved and is now breathing comfortably, however still has oxygen requirement to maintain >88%.  Discussed medical admission with patient, however he is very adamant against this and would prefer to continue treatment at home.  He is aware of the risks associated with this and would like to proceed.  Rx'd prednisone X 5-day course in addition to refill of his albuterol nebulizer.  Recommended scheduling albuterol every 4 hours for the next 24 hours then  taper as tolerated.  Increase Lasix to 40 mg BID for next 2-3 days and follow up with PCP.  Use home O2 to maintain sats >88%. ED precautions discussed.  Final Clinical Impression(s) / ED Diagnoses Final diagnoses:  COPD exacerbation (HCC)  Peripheral edema    Rx / DC Orders ED Discharge Orders         Ordered    albuterol (PROVENTIL) (2.5 MG/3ML) 0.083% nebulizer solution   Every 4 hours PRN        11/01/20 1505    predniSONE (DELTASONE) 20 MG tablet  Daily with breakfast        11/01/20 1505           Allayne Stack, DO 11/01/20 1624    Gwyneth Sprout, MD 11/02/20 1018

## 2020-11-01 NOTE — ED Notes (Signed)
Pt d/c home per MD order. Discharge summary reviewed with pt, pt verbalizes understanding. No s/s of acute distress noted at discharge, home o2 in place.  Reports discharge ride home.

## 2020-11-11 ENCOUNTER — Other Ambulatory Visit: Payer: Self-pay

## 2020-11-11 ENCOUNTER — Inpatient Hospital Stay (HOSPITAL_COMMUNITY)
Admission: EM | Admit: 2020-11-11 | Discharge: 2020-11-15 | DRG: 378 | Disposition: A | Discharge status: Home / Self Care | Payer: Medicare HMO | Attending: Internal Medicine | Admitting: Internal Medicine

## 2020-11-11 ENCOUNTER — Encounter (HOSPITAL_COMMUNITY): Payer: Self-pay

## 2020-11-11 DIAGNOSIS — Z716 Tobacco abuse counseling: Secondary | ICD-10-CM

## 2020-11-11 DIAGNOSIS — K920 Hematemesis: Secondary | ICD-10-CM

## 2020-11-11 DIAGNOSIS — I5042 Chronic combined systolic (congestive) and diastolic (congestive) heart failure: Secondary | ICD-10-CM | POA: Diagnosis present

## 2020-11-11 DIAGNOSIS — I2510 Atherosclerotic heart disease of native coronary artery without angina pectoris: Secondary | ICD-10-CM | POA: Diagnosis present

## 2020-11-11 DIAGNOSIS — B9681 Helicobacter pylori [H. pylori] as the cause of diseases classified elsewhere: Secondary | ICD-10-CM | POA: Diagnosis present

## 2020-11-11 DIAGNOSIS — R7989 Other specified abnormal findings of blood chemistry: Secondary | ICD-10-CM | POA: Diagnosis not present

## 2020-11-11 DIAGNOSIS — I110 Hypertensive heart disease with heart failure: Secondary | ICD-10-CM | POA: Diagnosis present

## 2020-11-11 DIAGNOSIS — Z79899 Other long term (current) drug therapy: Secondary | ICD-10-CM | POA: Diagnosis not present

## 2020-11-11 DIAGNOSIS — E875 Hyperkalemia: Secondary | ICD-10-CM | POA: Diagnosis present

## 2020-11-11 DIAGNOSIS — K2289 Other specified disease of esophagus: Secondary | ICD-10-CM | POA: Diagnosis not present

## 2020-11-11 DIAGNOSIS — K922 Gastrointestinal hemorrhage, unspecified: Secondary | ICD-10-CM | POA: Diagnosis present

## 2020-11-11 DIAGNOSIS — K250 Acute gastric ulcer with hemorrhage: Secondary | ICD-10-CM | POA: Diagnosis present

## 2020-11-11 DIAGNOSIS — K7689 Other specified diseases of liver: Secondary | ICD-10-CM | POA: Diagnosis not present

## 2020-11-11 DIAGNOSIS — D500 Iron deficiency anemia secondary to blood loss (chronic): Secondary | ICD-10-CM | POA: Diagnosis not present

## 2020-11-11 DIAGNOSIS — J9611 Chronic respiratory failure with hypoxia: Secondary | ICD-10-CM | POA: Diagnosis present

## 2020-11-11 DIAGNOSIS — K31A19 Gastric intestinal metaplasia without dysplasia, unspecified site: Secondary | ICD-10-CM | POA: Diagnosis not present

## 2020-11-11 DIAGNOSIS — Z7951 Long term (current) use of inhaled steroids: Secondary | ICD-10-CM

## 2020-11-11 DIAGNOSIS — Z8673 Personal history of transient ischemic attack (TIA), and cerebral infarction without residual deficits: Secondary | ICD-10-CM

## 2020-11-11 DIAGNOSIS — J320 Chronic maxillary sinusitis: Secondary | ICD-10-CM | POA: Diagnosis present

## 2020-11-11 DIAGNOSIS — M7552 Bursitis of left shoulder: Secondary | ICD-10-CM | POA: Diagnosis not present

## 2020-11-11 DIAGNOSIS — T39395A Adverse effect of other nonsteroidal anti-inflammatory drugs [NSAID], initial encounter: Secondary | ICD-10-CM | POA: Diagnosis present

## 2020-11-11 DIAGNOSIS — I4892 Unspecified atrial flutter: Secondary | ICD-10-CM | POA: Diagnosis present

## 2020-11-11 DIAGNOSIS — J9612 Chronic respiratory failure with hypercapnia: Secondary | ICD-10-CM | POA: Diagnosis present

## 2020-11-11 DIAGNOSIS — F17210 Nicotine dependence, cigarettes, uncomplicated: Secondary | ICD-10-CM | POA: Diagnosis present

## 2020-11-11 DIAGNOSIS — Z9981 Dependence on supplemental oxygen: Secondary | ICD-10-CM | POA: Diagnosis not present

## 2020-11-11 DIAGNOSIS — R531 Weakness: Secondary | ICD-10-CM | POA: Diagnosis not present

## 2020-11-11 DIAGNOSIS — M75112 Incomplete rotator cuff tear or rupture of left shoulder, not specified as traumatic: Secondary | ICD-10-CM | POA: Diagnosis present

## 2020-11-11 DIAGNOSIS — I480 Paroxysmal atrial fibrillation: Secondary | ICD-10-CM | POA: Diagnosis present

## 2020-11-11 DIAGNOSIS — I10 Essential (primary) hypertension: Secondary | ICD-10-CM | POA: Diagnosis present

## 2020-11-11 DIAGNOSIS — J449 Chronic obstructive pulmonary disease, unspecified: Secondary | ICD-10-CM | POA: Diagnosis present

## 2020-11-11 DIAGNOSIS — D62 Acute posthemorrhagic anemia: Secondary | ICD-10-CM | POA: Diagnosis present

## 2020-11-11 DIAGNOSIS — G8929 Other chronic pain: Secondary | ICD-10-CM | POA: Diagnosis present

## 2020-11-11 DIAGNOSIS — R61 Generalized hyperhidrosis: Secondary | ICD-10-CM | POA: Diagnosis not present

## 2020-11-11 DIAGNOSIS — Z20822 Contact with and (suspected) exposure to covid-19: Secondary | ICD-10-CM | POA: Diagnosis present

## 2020-11-11 DIAGNOSIS — I5043 Acute on chronic combined systolic (congestive) and diastolic (congestive) heart failure: Secondary | ICD-10-CM | POA: Diagnosis present

## 2020-11-11 DIAGNOSIS — R062 Wheezing: Secondary | ICD-10-CM | POA: Diagnosis not present

## 2020-11-11 DIAGNOSIS — I959 Hypotension, unspecified: Secondary | ICD-10-CM | POA: Diagnosis not present

## 2020-11-11 DIAGNOSIS — K2950 Unspecified chronic gastritis without bleeding: Secondary | ICD-10-CM | POA: Diagnosis present

## 2020-11-11 DIAGNOSIS — R110 Nausea: Secondary | ICD-10-CM | POA: Diagnosis not present

## 2020-11-11 DIAGNOSIS — K2970 Gastritis, unspecified, without bleeding: Secondary | ICD-10-CM | POA: Diagnosis not present

## 2020-11-11 DIAGNOSIS — I428 Other cardiomyopathies: Secondary | ICD-10-CM | POA: Diagnosis present

## 2020-11-11 DIAGNOSIS — I483 Typical atrial flutter: Secondary | ICD-10-CM | POA: Diagnosis not present

## 2020-11-11 DIAGNOSIS — K279 Peptic ulcer, site unspecified, unspecified as acute or chronic, without hemorrhage or perforation: Secondary | ICD-10-CM | POA: Diagnosis not present

## 2020-11-11 DIAGNOSIS — I81 Portal vein thrombosis: Secondary | ICD-10-CM | POA: Diagnosis not present

## 2020-11-11 DIAGNOSIS — R7401 Elevation of levels of liver transaminase levels: Secondary | ICD-10-CM

## 2020-11-11 DIAGNOSIS — Z888 Allergy status to other drugs, medicaments and biological substances status: Secondary | ICD-10-CM

## 2020-11-11 DIAGNOSIS — Z7901 Long term (current) use of anticoagulants: Secondary | ICD-10-CM | POA: Diagnosis not present

## 2020-11-11 DIAGNOSIS — R0689 Other abnormalities of breathing: Secondary | ICD-10-CM | POA: Diagnosis not present

## 2020-11-11 DIAGNOSIS — D509 Iron deficiency anemia, unspecified: Secondary | ICD-10-CM | POA: Diagnosis not present

## 2020-11-11 DIAGNOSIS — K254 Chronic or unspecified gastric ulcer with hemorrhage: Secondary | ICD-10-CM | POA: Diagnosis not present

## 2020-11-11 DIAGNOSIS — Z8249 Family history of ischemic heart disease and other diseases of the circulatory system: Secondary | ICD-10-CM

## 2020-11-11 DIAGNOSIS — M19012 Primary osteoarthritis, left shoulder: Secondary | ICD-10-CM | POA: Diagnosis not present

## 2020-11-11 LAB — COMPREHENSIVE METABOLIC PANEL
ALT: 42 U/L (ref 0–44)
AST: 40 U/L (ref 15–41)
Albumin: 2.8 g/dL — ABNORMAL LOW (ref 3.5–5.0)
Alkaline Phosphatase: 50 U/L (ref 38–126)
Anion gap: 7 (ref 5–15)
BUN: 37 mg/dL — ABNORMAL HIGH (ref 8–23)
CO2: 36 mmol/L — ABNORMAL HIGH (ref 22–32)
Calcium: 8 mg/dL — ABNORMAL LOW (ref 8.9–10.3)
Chloride: 100 mmol/L (ref 98–111)
Creatinine, Ser: 0.99 mg/dL (ref 0.61–1.24)
GFR, Estimated: >60 mL/min (ref >60)
Glucose, Bld: 154 mg/dL — ABNORMAL HIGH (ref 70–99)
Potassium: 4.7 mmol/L (ref 3.5–5.1)
Sodium: 143 mmol/L (ref 135–145)
Total Bilirubin: 0.7 mg/dL (ref 0.3–1.2)
Total Protein: 4.8 g/dL — ABNORMAL LOW (ref 6.5–8.1)

## 2020-11-11 LAB — CBC
HCT: 33.9 % — ABNORMAL LOW (ref 39.0–52.0)
Hemoglobin: 8.7 g/dL — ABNORMAL LOW (ref 13.0–17.0)
MCH: 23.1 pg — ABNORMAL LOW (ref 26.0–34.0)
MCHC: 25.7 g/dL — ABNORMAL LOW (ref 30.0–36.0)
MCV: 89.9 fL (ref 80.0–100.0)
Platelets: 184 10*3/uL (ref 150–400)
RBC: 3.77 MIL/uL — ABNORMAL LOW (ref 4.22–5.81)
RDW: 21.4 % — ABNORMAL HIGH (ref 11.5–15.5)
WBC: 12.9 10*3/uL — ABNORMAL HIGH (ref 4.0–10.5)
nRBC: 0.9 % — ABNORMAL HIGH (ref 0.0–0.2)

## 2020-11-11 LAB — POC OCCULT BLOOD, ED: Fecal Occult Bld: POSITIVE — AB

## 2020-11-11 MED ORDER — SODIUM CHLORIDE 0.9 % IV SOLN
80.0000 mg | Freq: Once | INTRAVENOUS | Status: AC
  Administered 2020-11-11: 80 mg via INTRAVENOUS
  Filled 2020-11-11: qty 80

## 2020-11-11 MED ORDER — SODIUM CHLORIDE 0.9 % IV SOLN: 1.0000 g | Freq: Once | INTRAVENOUS | Status: DC

## 2020-11-11 MED ORDER — PANTOPRAZOLE SODIUM 40 MG IV SOLR: 40.0000 mg | Freq: Two times a day (BID) | INTRAVENOUS | Status: DC

## 2020-11-11 MED ORDER — SODIUM CHLORIDE 0.9 % IV SOLN
8.0000 mg/h | INTRAVENOUS | Status: DC
  Administered 2020-11-11 – 2020-11-14 (×5): 8 mg/h via INTRAVENOUS
  Filled 2020-11-11 (×10): qty 80

## 2020-11-11 NOTE — ED Triage Notes (Signed)
Pt presents from home via [facility], c/o 1 episode of dark red color vomit as well as abdominal discomfort throughout the day. Denies any diarrhea or blood in stool. Also reports bilateral leg swelling. Wears 3L [place] at baseline

## 2020-11-12 ENCOUNTER — Inpatient Hospital Stay (HOSPITAL_COMMUNITY): Payer: Medicare HMO | Admitting: Registered Nurse

## 2020-11-12 ENCOUNTER — Encounter (HOSPITAL_COMMUNITY): Payer: Self-pay | Admitting: Family Medicine

## 2020-11-12 ENCOUNTER — Encounter (HOSPITAL_COMMUNITY)
Admission: EM | Disposition: A | Discharge status: Home / Self Care | Payer: Self-pay | Source: Home / Self Care | Attending: Internal Medicine

## 2020-11-12 DIAGNOSIS — F17210 Nicotine dependence, cigarettes, uncomplicated: Secondary | ICD-10-CM | POA: Diagnosis present

## 2020-11-12 DIAGNOSIS — M7552 Bursitis of left shoulder: Secondary | ICD-10-CM | POA: Diagnosis not present

## 2020-11-12 DIAGNOSIS — I2510 Atherosclerotic heart disease of native coronary artery without angina pectoris: Secondary | ICD-10-CM

## 2020-11-12 DIAGNOSIS — I483 Typical atrial flutter: Secondary | ICD-10-CM | POA: Diagnosis not present

## 2020-11-12 DIAGNOSIS — E875 Hyperkalemia: Secondary | ICD-10-CM | POA: Diagnosis present

## 2020-11-12 DIAGNOSIS — K922 Gastrointestinal hemorrhage, unspecified: Secondary | ICD-10-CM | POA: Diagnosis present

## 2020-11-12 DIAGNOSIS — I5042 Chronic combined systolic (congestive) and diastolic (congestive) heart failure: Secondary | ICD-10-CM

## 2020-11-12 DIAGNOSIS — G8929 Other chronic pain: Secondary | ICD-10-CM | POA: Diagnosis present

## 2020-11-12 DIAGNOSIS — D62 Acute posthemorrhagic anemia: Secondary | ICD-10-CM | POA: Diagnosis present

## 2020-11-12 DIAGNOSIS — K7689 Other specified diseases of liver: Secondary | ICD-10-CM | POA: Diagnosis not present

## 2020-11-12 DIAGNOSIS — K250 Acute gastric ulcer with hemorrhage: Secondary | ICD-10-CM | POA: Diagnosis present

## 2020-11-12 DIAGNOSIS — K2950 Unspecified chronic gastritis without bleeding: Secondary | ICD-10-CM | POA: Diagnosis present

## 2020-11-12 DIAGNOSIS — J320 Chronic maxillary sinusitis: Secondary | ICD-10-CM | POA: Diagnosis present

## 2020-11-12 DIAGNOSIS — Z7901 Long term (current) use of anticoagulants: Secondary | ICD-10-CM | POA: Diagnosis not present

## 2020-11-12 DIAGNOSIS — J9612 Chronic respiratory failure with hypercapnia: Secondary | ICD-10-CM

## 2020-11-12 DIAGNOSIS — M75112 Incomplete rotator cuff tear or rupture of left shoulder, not specified as traumatic: Secondary | ICD-10-CM | POA: Diagnosis present

## 2020-11-12 DIAGNOSIS — M19012 Primary osteoarthritis, left shoulder: Secondary | ICD-10-CM | POA: Diagnosis not present

## 2020-11-12 DIAGNOSIS — I480 Paroxysmal atrial fibrillation: Secondary | ICD-10-CM | POA: Diagnosis present

## 2020-11-12 DIAGNOSIS — K2970 Gastritis, unspecified, without bleeding: Secondary | ICD-10-CM

## 2020-11-12 DIAGNOSIS — Z9981 Dependence on supplemental oxygen: Secondary | ICD-10-CM | POA: Diagnosis not present

## 2020-11-12 DIAGNOSIS — B9681 Helicobacter pylori [H. pylori] as the cause of diseases classified elsewhere: Secondary | ICD-10-CM | POA: Diagnosis present

## 2020-11-12 DIAGNOSIS — K920 Hematemesis: Secondary | ICD-10-CM

## 2020-11-12 DIAGNOSIS — J9611 Chronic respiratory failure with hypoxia: Secondary | ICD-10-CM

## 2020-11-12 DIAGNOSIS — D500 Iron deficiency anemia secondary to blood loss (chronic): Secondary | ICD-10-CM | POA: Diagnosis not present

## 2020-11-12 DIAGNOSIS — R531 Weakness: Secondary | ICD-10-CM | POA: Diagnosis not present

## 2020-11-12 DIAGNOSIS — Z8673 Personal history of transient ischemic attack (TIA), and cerebral infarction without residual deficits: Secondary | ICD-10-CM | POA: Diagnosis not present

## 2020-11-12 DIAGNOSIS — Z7951 Long term (current) use of inhaled steroids: Secondary | ICD-10-CM | POA: Diagnosis not present

## 2020-11-12 DIAGNOSIS — D509 Iron deficiency anemia, unspecified: Secondary | ICD-10-CM

## 2020-11-12 DIAGNOSIS — I81 Portal vein thrombosis: Secondary | ICD-10-CM | POA: Diagnosis not present

## 2020-11-12 DIAGNOSIS — Z20822 Contact with and (suspected) exposure to covid-19: Secondary | ICD-10-CM | POA: Diagnosis present

## 2020-11-12 DIAGNOSIS — J449 Chronic obstructive pulmonary disease, unspecified: Secondary | ICD-10-CM

## 2020-11-12 DIAGNOSIS — Z79899 Other long term (current) drug therapy: Secondary | ICD-10-CM | POA: Diagnosis not present

## 2020-11-12 DIAGNOSIS — K2289 Other specified disease of esophagus: Secondary | ICD-10-CM | POA: Diagnosis not present

## 2020-11-12 DIAGNOSIS — I110 Hypertensive heart disease with heart failure: Secondary | ICD-10-CM | POA: Diagnosis present

## 2020-11-12 DIAGNOSIS — K254 Chronic or unspecified gastric ulcer with hemorrhage: Secondary | ICD-10-CM | POA: Diagnosis not present

## 2020-11-12 DIAGNOSIS — R7989 Other specified abnormal findings of blood chemistry: Secondary | ICD-10-CM | POA: Diagnosis not present

## 2020-11-12 DIAGNOSIS — K279 Peptic ulcer, site unspecified, unspecified as acute or chronic, without hemorrhage or perforation: Secondary | ICD-10-CM | POA: Diagnosis not present

## 2020-11-12 DIAGNOSIS — Z8249 Family history of ischemic heart disease and other diseases of the circulatory system: Secondary | ICD-10-CM | POA: Diagnosis not present

## 2020-11-12 DIAGNOSIS — I428 Other cardiomyopathies: Secondary | ICD-10-CM | POA: Diagnosis present

## 2020-11-12 HISTORY — PX: ESOPHAGOGASTRODUODENOSCOPY: SHX5428

## 2020-11-12 HISTORY — PX: SUBMUCOSAL TATTOO INJECTION: SHX6856

## 2020-11-12 HISTORY — PX: HOT HEMOSTASIS: SHX5433

## 2020-11-12 HISTORY — PX: BIOPSY: SHX5522

## 2020-11-12 HISTORY — PX: HEMOSTASIS CLIP PLACEMENT: SHX6857

## 2020-11-12 HISTORY — PX: HEMOSTASIS CONTROL: SHX6838

## 2020-11-12 LAB — BLOOD GAS, ARTERIAL
Acid-Base Excess: 5.8 mmol/L — ABNORMAL HIGH (ref 0.0–2.0)
Bicarbonate: 35.8 mmol/L — ABNORMAL HIGH (ref 20.0–28.0)
Drawn by: 560031
FIO2: 36
O2 Content: 4 L/min
O2 Saturation: 93.7 %
Patient temperature: 97.8
pCO2 arterial: 104 mmHg (ref 32.0–48.0)
pH, Arterial: 7.161 — CL (ref 7.350–7.450)
pO2, Arterial: 86.6 mmHg (ref 83.0–108.0)

## 2020-11-12 LAB — BASIC METABOLIC PANEL
Anion gap: 3 — ABNORMAL LOW (ref 5–15)
BUN: 50 mg/dL — ABNORMAL HIGH (ref 8–23)
CO2: 38 mmol/L — ABNORMAL HIGH (ref 22–32)
Calcium: 8 mg/dL — ABNORMAL LOW (ref 8.9–10.3)
Chloride: 102 mmol/L (ref 98–111)
Creatinine, Ser: 0.98 mg/dL (ref 0.61–1.24)
GFR, Estimated: >60 mL/min (ref >60)
Glucose, Bld: 135 mg/dL — ABNORMAL HIGH (ref 70–99)
Potassium: 4.5 mmol/L (ref 3.5–5.1)
Sodium: 143 mmol/L (ref 135–145)

## 2020-11-12 LAB — HEMATOCRIT
HCT: 32.4 % — ABNORMAL LOW (ref 39.0–52.0)
HCT: 33.2 % — ABNORMAL LOW (ref 39.0–52.0)
HCT: 33.6 % — ABNORMAL LOW (ref 39.0–52.0)
HCT: 31.3 % — ABNORMAL LOW (ref 39.0–52.0)

## 2020-11-12 LAB — HEMOGLOBIN
Hemoglobin: 8.2 g/dL — ABNORMAL LOW (ref 13.0–17.0)
Hemoglobin: 8.4 g/dL — ABNORMAL LOW (ref 13.0–17.0)
Hemoglobin: 8.4 g/dL — ABNORMAL LOW (ref 13.0–17.0)
Hemoglobin: 8 g/dL — ABNORMAL LOW (ref 13.0–17.0)

## 2020-11-12 LAB — MAGNESIUM: Magnesium: 2.1 mg/dL (ref 1.7–2.4)

## 2020-11-12 LAB — RESP PANEL BY RT-PCR (FLU A&B, COVID) ARPGX2
Influenza A by PCR: NEGATIVE
Influenza B by PCR: NEGATIVE
SARS Coronavirus 2 by RT PCR: NEGATIVE

## 2020-11-12 LAB — MRSA PCR SCREENING: MRSA by PCR: NEGATIVE

## 2020-11-12 SURGERY — EGD (ESOPHAGOGASTRODUODENOSCOPY)
Anesthesia: Monitor Anesthesia Care

## 2020-11-12 MED ORDER — ONDANSETRON HCL 4 MG/2ML IJ SOLN: 4.0000 mg | Freq: Four times a day (QID) | INTRAMUSCULAR | Status: DC | PRN

## 2020-11-12 MED ORDER — PROPOFOL 500 MG/50ML IV EMUL
INTRAVENOUS | Status: DC | PRN
  Administered 2020-11-12: 110 ug/kg/min via INTRAVENOUS

## 2020-11-12 MED ORDER — UMECLIDINIUM BROMIDE 62.5 MCG/INH IN AEPB
1.0000 | INHALATION_SPRAY | Freq: Every day | RESPIRATORY_TRACT | Status: DC
  Administered 2020-11-12 – 2020-11-15 (×4): 1 via RESPIRATORY_TRACT
  Filled 2020-11-12: qty 7

## 2020-11-12 MED ORDER — ONDANSETRON HCL 4 MG PO TABS: 4.0000 mg | ORAL_TABLET | Freq: Four times a day (QID) | ORAL | Status: DC | PRN

## 2020-11-12 MED ORDER — IPRATROPIUM-ALBUTEROL 0.5-2.5 (3) MG/3ML IN SOLN
3.0000 mL | Freq: Once | RESPIRATORY_TRACT | Status: AC
  Administered 2020-11-12: 3 mL via RESPIRATORY_TRACT

## 2020-11-12 MED ORDER — ERYTHROMYCIN BASE 250 MG PO TABS
250.0000 mg | ORAL_TABLET | ORAL | Status: AC
  Administered 2020-11-12: 250 mg via ORAL
  Filled 2020-11-12: qty 1

## 2020-11-12 MED ORDER — SPOT INK MARKER SYRINGE KIT
PACK | SUBMUCOSAL | Status: DC | PRN
  Administered 2020-11-12: 2 mL via SUBMUCOSAL

## 2020-11-12 MED ORDER — CHLORHEXIDINE GLUCONATE CLOTH 2 % EX PADS
6.0000 | MEDICATED_PAD | Freq: Every day | CUTANEOUS | Status: DC
  Administered 2020-11-12 – 2020-11-14 (×3): 6 via TOPICAL

## 2020-11-12 MED ORDER — ONDANSETRON HCL 4 MG/2ML IJ SOLN
INTRAMUSCULAR | Status: DC | PRN
  Administered 2020-11-12: 4 mg via INTRAVENOUS

## 2020-11-12 MED ORDER — ACETAMINOPHEN 650 MG RE SUPP: 650.0000 mg | Freq: Four times a day (QID) | RECTAL | Status: DC | PRN

## 2020-11-12 MED ORDER — PHENYLEPHRINE 40 MCG/ML (10ML) SYRINGE FOR IV PUSH (FOR BLOOD PRESSURE SUPPORT)
PREFILLED_SYRINGE | INTRAVENOUS | Status: DC | PRN
  Administered 2020-11-12 (×4): 80 ug via INTRAVENOUS

## 2020-11-12 MED ORDER — ACETAMINOPHEN 325 MG PO TABS
650.0000 mg | ORAL_TABLET | Freq: Four times a day (QID) | ORAL | Status: DC | PRN
  Administered 2020-11-13 – 2020-11-15 (×4): 650 mg via ORAL
  Filled 2020-11-12 (×4): qty 2

## 2020-11-12 MED ORDER — ORAL CARE MOUTH RINSE
15.0000 mL | Freq: Two times a day (BID) | OROMUCOSAL | Status: DC
  Administered 2020-11-13 – 2020-11-15 (×5): 15 mL via OROMUCOSAL

## 2020-11-12 MED ORDER — MOMETASONE FURO-FORMOTEROL FUM 100-5 MCG/ACT IN AERO
2.0000 | INHALATION_SPRAY | Freq: Two times a day (BID) | RESPIRATORY_TRACT | Status: DC
  Administered 2020-11-12 – 2020-11-15 (×6): 2 via RESPIRATORY_TRACT
  Filled 2020-11-12: qty 8.8

## 2020-11-12 MED ORDER — LACTATED RINGERS IV SOLN: INTRAVENOUS | Status: DC | PRN

## 2020-11-12 MED ORDER — GABAPENTIN 300 MG PO CAPS
300.0000 mg | ORAL_CAPSULE | Freq: Every day | ORAL | Status: DC
  Administered 2020-11-13 – 2020-11-14 (×2): 300 mg via ORAL
  Filled 2020-11-12 (×2): qty 1

## 2020-11-12 MED ORDER — ALBUTEROL SULFATE (2.5 MG/3ML) 0.083% IN NEBU: 2.5000 mg | INHALATION_SOLUTION | RESPIRATORY_TRACT | Status: DC | PRN

## 2020-11-12 MED ORDER — BUDESON-GLYCOPYRROL-FORMOTEROL 160-9-4.8 MCG/ACT IN AERO: 2.0000 | INHALATION_SPRAY | Freq: Two times a day (BID) | RESPIRATORY_TRACT | Status: DC

## 2020-11-12 MED ORDER — EPINEPHRINE 1 MG/10ML IJ SOSY
PREFILLED_SYRINGE | INTRAMUSCULAR | Status: AC
  Filled 2020-11-12: qty 10

## 2020-11-12 MED ORDER — DEXAMETHASONE SODIUM PHOSPHATE 10 MG/ML IJ SOLN
INTRAMUSCULAR | Status: DC | PRN
  Administered 2020-11-12: 5 mg via INTRAVENOUS

## 2020-11-12 MED ORDER — SODIUM CHLORIDE 0.9 % IV SOLN: INTRAVENOUS | Status: DC

## 2020-11-12 MED ORDER — SODIUM CHLORIDE (PF) 0.9 % IJ SOLN
INTRAMUSCULAR | Status: DC | PRN
  Administered 2020-11-12: 2.5 mL

## 2020-11-12 MED ORDER — SPOT INK MARKER SYRINGE KIT
PACK | SUBMUCOSAL | Status: AC
  Filled 2020-11-12: qty 5

## 2020-11-12 MED ORDER — ATORVASTATIN CALCIUM 40 MG PO TABS
80.0000 mg | ORAL_TABLET | Freq: Every day | ORAL | Status: DC
  Administered 2020-11-13 – 2020-11-15 (×3): 80 mg via ORAL
  Filled 2020-11-12 (×3): qty 2

## 2020-11-12 NOTE — H&P (Signed)
History and Physical    Jared DageJohnny Sevigny ZOX:[id number] DOB: 01/23/1956 DOA: 11/11/2020  PCP: Hoy RegisterNewlin, Enobong, MD   Patient coming from: Home   Chief Complaint: Dark red vomit   HPI: Jared [name] is a 65 y.o. male with medical history significant for COPD, chronic hypoxic and hypercarbic respiratory failure, chronic combined systolic and diastolic CHF, paroxysmal atrial fibrillation on Xarelto, history of CVA, and history of GI bleeding, now presenting to the emergency department for evaluation of abdominal discomfort with dark red vomit.  Patient reports that he been in his usual state until yesterday evening when he developed some abdominal discomfort, nausea, and vomited what appeared to be dark red and maroon liquid.  He last took Xarelto the evening of 11/10/2020.  He does not have any abdominal pain in the emergency department.  He has not had any further episodes of vomiting.  He had not noted any melena or hematochezia.  He was admitted for symptomatic anemia just over a month ago and had small bowel enteroscopy with normal esophagus, stomach, duodenum, and visualized portions of jejunum.  ED Course: Upon arrival to the ED, patient is found to be afebrile, saturating well on his usual 3 L/min of supplemental oxygen, slightly tachypneic and tachycardic, and with blood pressure 92/60.  Chemistry panel is notable for BUN of 37, up from 13 late last month.  CBC features a leukocytosis to 12,900 and hemoglobin 8.7.  Gastroenterology was consulted by the ED physician and the patient was treated with IV Protonix and erythromycin.  He went on to have a loose stool that appeared bloody per nursing report.  Review of Systems:  All other systems reviewed and apart from HPI, are negative.  Past Medical History:  Diagnosis Date  . AVM (arteriovenous malformation)   . CAD (coronary artery disease)    a. LHC 5/12:  LAD 20, pLCx 20, pRCA 40, dRCA 40, EF 35%, diff HK  //  b. Myoview 4/16: Overall Impression:  High risk stress nuclear study There is no evidence of ischemia. There is severe LV dysfunction. LV Ejection Fraction: 30%. LV Wall Motion: There is global LV hypokinesis.   Marland Kitchen. CAP (community acquired pneumonia) 09/2013  . Chronic combined systolic and diastolic CHF (congestive heart failure) (HCC)    a. Echo 4/16:Mild LVH, EF 40-45%, diffuse HK //  b. Echo 8/17: EF 35-40%, diffuse HK, diastolic dysfunction, aortic sclerosis, trivial MR, moderate LAE, normal RVSF, moderate RAE, mild TR, PASP 42 mmHg // c. Echo 4/18: Mild concentric LVH, EF 30-35, normal wall motion, grade 1 diastolic dysfunction, PASP 49  . Chronic respiratory failure (HCC)   . Cluster headache    "hx; haven't had one in awhile" (01/09/2016)  . COPD (chronic obstructive pulmonary disease) (HCC)    [name]/notes 01/09/2016  . History of CVA (cerebrovascular accident)   . Hypertension   . IDA (iron deficiency anemia)   . Moderate tobacco use disorder   . NICM (nonischemic cardiomyopathy) (HCC)   . Nicotine addiction   . Tobacco abuse     Past Surgical History:  Procedure Laterality Date  . CARDIAC CATHETERIZATION  10/2010   LM normal, LAD with 20% irregularities, LCX with 20%, RCA with 40% prox and 40% distal - EF of 35%  . CATARACT EXTRACTION, BILATERAL    . COLONOSCOPY W/ BIOPSIES AND POLYPECTOMY    . COLONOSCOPY WITH PROPOFOL N/A 09/06/2018   Procedure: COLONOSCOPY WITH PROPOFOL;  Surgeon: Tressia DanasBeavers, Kimberly, MD;  Location[facility] ENDOSCOPY;  Service: Gastroenterology;  Laterality: N/A;  .  ENTEROSCOPY N/A 09/28/2018   Procedure: ENTEROSCOPY;  Surgeon: Jeani Hawking, MD;  Location: [facility] ENDOSCOPY;  Service: Endoscopy;  Laterality: N/A;  . ENTEROSCOPY N/A 10/28/2018   Procedure: ENTEROSCOPY;  Surgeon: Tressia Danas, MD;  Location: [facility] ENDOSCOPY;  Service: Gastroenterology;  Laterality: N/A;  . ENTEROSCOPY N/A 10/09/2020   Procedure: ENTEROSCOPY;  Surgeon: Hilarie Fredrickson, MD;  Location: [facility] ENDOSCOPY;  Service: Endoscopy;  Laterality:  N/A;  . ESOPHAGOGASTRODUODENOSCOPY (EGD) WITH PROPOFOL N/A 09/05/2018   Procedure: ESOPHAGOGASTRODUODENOSCOPY (EGD) WITH PROPOFOL;  Surgeon: Benancio Deeds, MD;  Location: [facility] ENDOSCOPY;  Service: Gastroenterology;  Laterality: N/A;  . EXCISION MASS HEAD    . GIVENS CAPSULE STUDY N/A 09/06/2018   Procedure: GIVENS CAPSULE STUDY;  Surgeon: Tressia Danas, MD;  Location: [facility] ENDOSCOPY;  Service: Gastroenterology;  Laterality: N/A;  . GIVENS CAPSULE STUDY N/A 09/26/2018   Procedure: GIVENS CAPSULE STUDY;  Surgeon: Beverley Fiedler, MD;  Location: [facility] ENDOSCOPY;  Service: Gastroenterology;  Laterality: N/A;  . GIVENS CAPSULE STUDY N/A 06/14/2019   Procedure: GIVENS CAPSULE STUDY;  Surgeon: Napoleon Form, MD;  Location: [facility] ENDOSCOPY;  Service: Endoscopy;  Laterality: N/A;  . HOT HEMOSTASIS N/A 10/28/2018   Procedure: HOT HEMOSTASIS (ARGON PLASMA COAGULATION/BICAP);  Surgeon: Tressia Danas, MD;  Location: [facility] ENDOSCOPY;  Service: Gastroenterology;  Laterality: N/A;  . INCISION AND DRAINAGE PERIRECTAL ABSCESS N/A 06/05/2017   Procedure: IRRIGATION AND DEBRIDEMENT PERIRECTAL ABSCESS;  Surgeon: Andria Meuse, MD;  Location: [facility] OR;  Service: General;  Laterality: N/A;  . VIDEO BRONCHOSCOPY Bilateral 05/08/2016   Procedure: VIDEO BRONCHOSCOPY WITH FLUORO;  Surgeon: Oretha Milch, MD;  Location: [facility] ENDOSCOPY;  Service: Cardiopulmonary;  Laterality: Bilateral;    Social History:   reports that he has been smoking cigarettes. He has a 94.00 pack-year smoking history. He has never used smokeless tobacco. He reports previous alcohol use. He reports that he does not use drugs.  Allergies  Allergen Reactions  . Lisinopril Cough    Family History  Problem Relation Age of Onset  . Heart disease Mother   . Diabetes Mother   . Colon cancer Mother   . Liver cancer Mother   . Cancer Father        type unknown  . Diabetes Sister        x 2  . Diabetes Brother      Prior to Admission  medications   Medication Sig Start Date End Date Taking? Authorizing Provider  albuterol (PROVENTIL) (2.5 MG/3ML) 0.083% nebulizer solution Take 3 mLs (2.5 mg total) by nebulization every 4 (four) hours as needed for wheezing or shortness of breath. 11/01/20  Yes Beard, Samantha N, DO  albuterol (VENTOLIN HFA) 108 (90 Base) MCG/ACT inhaler INHALE 2 PUFFS INTO THE LUNGS EVERY 6 (SIX) HOURS AS NEEDED FOR WHEEZING OR SHORTNESS OF BREATH. 08/15/20 08/15/21 Yes Newlin, Enobong, MD  atorvastatin (LIPITOR) 80 MG tablet TAKE 1 TABLET EVERY DAY 08/03/20  Yes [name], MD  Budeson-Glycopyrrol-Formoterol 160-9-4.8 MCG/ACT AERO INHALE 2 PUFFS INTO THE LUNGS 2 (TWO) TIMES DAILY. 04/27/20 04/27/21 Yes Nyoka Cowden, MD  carvedilol (COREG) 3.125 MG tablet TAKE 1 TABLET (3.125 MG TOTAL) BY MOUTH 2 (TWO) TIMES DAILY. Patient taking differently: Take 3.125 mg by mouth 2 (two) times daily. 06/14/20 06/14/21 Yes [name], MD  furosemide (LASIX) 40 MG tablet TAKE 1 TABLET EVERY DAY Patient taking differently: Take 40 mg by mouth daily. 08/03/20  Yes Newlin, Odette Horns, MD  gabapentin (NEURONTIN) 300 MG capsule TAKE 1 CAPSULE (300  MG TOTAL) BY MOUTH AT BEDTIME. Patient taking differently: Take 300 mg by mouth at bedtime. 03/23/20 03/23/21 Yes [name], MD  Misc. Devices MISC Portable oxygen concentrator. Diagnosis COPD. Patient taking differently: Inhale 3 L into the lungs. Portable oxygen concentrator. Diagnosis COPD. 12/05/18  Yes Newlin, Enobong, MD  nitroGLYCERIN (NITROSTAT) 0.4 MG SL tablet PLACE 1 TABLET (0.4 MG TOTAL) UNDER THE TONGUE EVERY 5 (FIVE) MINUTES AS NEEDED FOR CHEST PAIN. CALL 911 IF SYMPTOMS DO NOT RESOLVE AFTER 3 MINUTES Patient taking differently: Place 0.4 mg under the tongue every 5 (five) minutes as needed for chest pain. 06/14/20 06/14/21 Yes Newlin, Enobong, MD  XARELTO 20 MG TABS tablet Take 20 mg by mouth daily. 10/18/20  Yes [provider]  TUDORZA PRESSAIR 400 MCG/ACT AEPB  INHALE 1 PUFF INTO THE LUNGS IN THE MORNING AND AT BEDTIME. 03/29/20 04/04/20  Nyoka Cowden, MD    Physical Exam: Vitals:   11/12/20 0200 11/12/20 0240 11/12/20 0300 11/12/20 0415  BP: (!) 110/59 (!) 97/56  108/72  Pulse: 84 88  83  Resp: (!) 22 (!) 22  (!) 22  Temp:   97.9 F (36.6 C)   TempSrc:   Axillary   SpO2: 100% 100%  97%  Weight:      Height:        Constitutional: NAD, calm  Eyes: PERTLA, lids and conjunctivae normal ENMT: Mucous membranes are moist. Posterior pharynx clear of any exudate or lesions.   Neck: supple, no masses  Respiratory:  no wheezing, no crackles. No accessory muscle use.  Cardiovascular: S1 & S2 heard, regular rate and rhythm. No extremity edema.  Abdomen: No distension, no tenderness, soft. Bowel sounds active.  Musculoskeletal: no clubbing / cyanosis. No joint deformity upper and lower extremities.   Skin: no significant rashes, lesions, ulcers. Warm, dry, well-perfused. Neurologic: No gross facial asymmetry. Sensation intact. Moving all extremities.  Psychiatric: Alert and oriented to person, place, and situation. Calm and cooperative.    Labs and Imaging on Admission: I have personally reviewed following labs and imaging studies  CBC: Recent Labs  Lab 11/11/20 2151 11/12/20 0359  WBC 12.9*  --   HGB 8.7* 8.2*  HCT 33.9* 32.4*  MCV 89.9  --   PLT 184  --    Basic Metabolic Panel: Recent Labs  Lab 11/11/20 2151 11/12/20 0359  NA 143 143  K 4.7 4.5  CL 100 102  CO2 36* 38*  GLUCOSE 154* 135*  BUN 37* 50*  CREATININE 0.99 0.98  CALCIUM 8.0* 8.0*  MG  --  2.1   GFR: Estimated Creatinine Clearance: 94.7 mL/min (by C-G formula based on SCr of 0.98 mg/dL). Liver Function Tests: Recent Labs  Lab 11/11/20 2151  AST 40  ALT 42  ALKPHOS 50  BILITOT 0.7  PROT 4.8*  ALBUMIN 2.8*   No results for input(s): LIPASE, AMYLASE in the last 168 hours. No results for input(s): AMMONIA in the last 168 hours. Coagulation  Profile: No results for input(s): INR, PROTIME in the last 168 hours. Cardiac Enzymes: No results for input(s): CKTOTAL, CKMB, CKMBINDEX, TROPONINI in the last 168 hours. BNP (last 3 results) No results for input(s): PROBNP in the last 8760 hours. HbA1C: No results for input(s): HGBA1C in the last 72 hours. CBG: No results for input(s): GLUCAP in the last 168 hours. Lipid Profile: No results for input(s): CHOL, HDL, LDLCALC, TRIG, CHOLHDL, LDLDIRECT in the last 72 hours. Thyroid Function Tests: No results for input(s): TSH,  T4TOTAL, FREET4, T3FREE, THYROIDAB in the last 72 hours. Anemia Panel: No results for input(s): VITAMINB12, FOLATE, FERRITIN, TIBC, IRON, RETICCTPCT in the last 72 hours. Urine analysis:    Component Value Date/Time   COLORURINE YELLOW 07/25/2016 1845   APPEARANCEUR HAZY (A) 07/25/2016 1845   LABSPEC 1.017 07/25/2016 1845   PHURINE 5.0 07/25/2016 1845   GLUCOSEU 50 (A) 07/25/2016 1845   HGBUR SMALL (A) 07/25/2016 1845   BILIRUBINUR NEGATIVE 07/25/2016 1845   KETONESUR NEGATIVE 07/25/2016 1845   PROTEINUR 100 (A) 07/25/2016 1845   NITRITE NEGATIVE 07/25/2016 1845   LEUKOCYTESUR NEGATIVE 07/25/2016 1845   Sepsis Labs: @LABRCNTIP (procalcitonin:4,lacticidven:4) ) Recent Results (from the past 240 hour(s))  Resp Panel by RT-PCR (Flu A&B, Covid) Nasopharyngeal Swab     Status: None   Collection Time: 11/11/20 11:06 PM   Specimen: Nasopharyngeal Swab; Nasopharyngeal(NP) swabs in vial transport medium  Result Value Ref Range Status   SARS Coronavirus 2 by RT PCR NEGATIVE NEGATIVE Final    Comment: (NOTE) SARS-CoV-2 target nucleic acids are NOT DETECTED.  The SARS-CoV-2 RNA is generally detectable in upper respiratory specimens during the acute phase of infection. The lowest concentration of SARS-CoV-2 viral copies this assay can detect is 138 copies/mL. A negative result does not preclude SARS-Cov-2 infection and should not be used as the sole basis for  treatment or other patient management decisions. A negative result may occur with  improper specimen collection/handling, submission of specimen other than nasopharyngeal swab, presence of viral mutation(s) within the areas targeted by this assay, and inadequate number of viral copies(<138 copies/mL). A negative result must be combined with clinical observations, patient history, and epidemiological information. The expected result is Negative.  Fact Sheet for Patients:  01/11/21  Fact Sheet for Healthcare Providers:  BloggerCourse.com  This test is no t yet approved or cleared by the [employer] FDA and  has been authorized for detection and/or diagnosis of SARS-CoV-2 by FDA under an Emergency Use Authorization (EUA). This EUA will remain  in effect (meaning this test can be used) for the duration of the COVID-19 declaration under Section 564(b)(1) of the Act, 21 U.S.C.section 360bbb-3(b)(1), unless the authorization is terminated  or revoked sooner.       Influenza A by PCR NEGATIVE NEGATIVE Final   Influenza B by PCR NEGATIVE NEGATIVE Final    Comment: (NOTE) The Xpert Xpress SARS-CoV-2/FLU/RSV plus assay is intended as an aid in the diagnosis of influenza from Nasopharyngeal swab specimens and should not be used as a sole basis for treatment. Nasal washings and aspirates are unacceptable for Xpert Xpress SARS-CoV-2/FLU/RSV testing.  Fact Sheet for Patients: [place]  Fact Sheet for Healthcare Providers: BloggerCourse.com  This test is not yet approved or cleared by the [employer]ious[employer].it FDA and has been authorized for detection and/or diagnosis of SARS-CoV-2 by FDA under an Emergency Use Authorization (EUA). This EUA will remain in effect (meaning this test can be used) for the duration of the COVID-19 declaration under Section 564(b)(1) of the Act, 21  U.S.C. section 360bbb-3(b)(1), unless the authorization is terminated or revoked.  Performed at [facility], [address], [place], [place]   MRSA PCR Screening     Status: None   Collection Time: 11/12/20  1:46 AM   Specimen: Nasal Mucosa; Nasopharyngeal  Result Value Ref Range Status   MRSA by PCR NEGATIVE NEGATIVE Final    Comment:        The GeneXpert MRSA Assay (FDA approved for NASAL  specimens only), is one component of a comprehensive MRSA colonization surveillance program. It is not intended to diagnose MRSA infection nor to guide or monitor treatment for MRSA infections. Performed at [facility], [address], [place], [place] [address]      Radiological Exams on Admission: No results found.   Assessment/Plan   1. Acute upper GI bleeding; anemia   - Presents after an episode of hematemesis at home and had loose stool with blood in hospital  - He is hemodynamically stable, initial Hgb 8.7 (11.0 on 11/01/20)  - BUN is 37 (13 on 11/01/20)  - GI consulting and much appreciated  - He was started on IV PPI in ED  - Type and screen, continue bowel-rest and IV PPI, trend H&H, transfuse if needed    2. Chronic combined systolic & diastolic CHF  - Appears compensated  - Hold Lasix, start gentle IVF hydration in setting of acute GI bleeding and NPO status   3. COPD; chronic hypoxic & hypercarbic respiratory failure  - Stable  - Continue ICS/LABA/LAMA and as-needed albuterol    4. Paroxysmal atrial fibrillation  - CHADS-VASc is at least 50 (age, CVA x2, CAD, CHF)  - Hold Xarelto in light of acute GIB, last dose was evening of 6/2    5. CAD - No anginal complaints  - Continue statin    6. History of CVA  - Continue statin, hold Xarelto for now    DVT prophylaxis: SCDs  Code Status: Full  Level of Care: Level of care: Stepdown Family Communication: None available  Disposition Plan:  Patient is from: Home   Anticipated d/c is to: TBD Anticipated d/c date is: 11/14/20 Patient currently: Pending GI consult, trend in H&H  Consults called: GI  Admission status: Inpatient    Briscoe Deutscher, MD [facility]  11/12/2020, 5:29 AM

## 2020-11-12 NOTE — Op Note (Addendum)
[facility] Patient Name: Jared [name] Procedure Date: 11/12/2020 MRN: [id number] Attending MD:  Mansouraty , MD Date of Birth: 06/29/1955 CSN: [id number] Age: 65 Admit Type: Inpatient Procedure:                Small bowel enteroscopy Indications:              Diagnostic procedure, Acute post hemorrhagic                            anemia, Iron deficiency anemia secondary to chronic                            blood loss, Unexplained iron deficiency anemia,                            Coffee-ground emesis, Hematemesis, Recent                            gastrointestinal bleeding Providers:                 Mansouraty, MD, Zharia Burton, RN, Brooke                            Person, Chris Chandler Tech., Technician, Lacey A.                            Armistead, CRNA Referring MD:             [facility], Henry L. Danis, MD, John N.                            Perry, MD Medicines:                Monitored Anesthesia Care Complications:            No immediate complications. Estimated Blood Loss:     Estimated blood loss was minimal. Procedure:                Pre-Anesthesia Assessment:                           - Prior to the procedure, a History and Physical                            was performed, and patient medications and                            allergies were reviewed. The patient's tolerance of                            previous anesthesia was also reviewed. The risks                            and benefits of the procedure and the sedation                            options and risks were discussed with   the patient.                            All questions were answered, and informed consent                            was obtained. Prior Anticoagulants: The patient has                            taken Xarelto (rivaroxaban), last dose was 2 days                            prior to procedure. ASA Grade Assessment: III - A                             patient with severe systemic disease. After                            reviewing the risks and benefits, the patient was                            deemed in satisfactory condition to undergo the                            procedure.                           After obtaining informed consent, the endoscope was                            passed under direct vision. Throughout the                            procedure, the patient's blood pressure, pulse, and                            oxygen saturations were monitored continuously. The                            PCF-H190DL ([id number]) Olympus pediatric colonscope                            was introduced through the mouth, and advanced to                            the proximal jejunum. The GIF-H190 ([id number])                            Olympus gastroscope was introduced through the                            mouth, and advanced to the second part of duodenum.                              The upper GI endoscopy was accomplished without                            difficulty. The patient tolerated the procedure.                            After obtaining informed consent, the endoscope was                            passed under direct vision. Throughout the                            procedure, the patient's blood pressure, pulse, and                            oxygen saturations were monitored continuously. Scope In: Scope Out: Findings:      No gross lesions were noted in the proximal esophagus and in the mid       esophagus.      An irregular Z-line and two tongues of salmon-colored mucosa were       present from 44 to 45 cm. No other visible abnormalities were present. I       did not biopsy today because findings were suggestive of possible       underlying varices (though imaging 4-months ago had not suggested       cirrhosis)      Hematin (altered blood/coffee-ground-like material) was found in the       entire examined stomach. Lavage  of the area was performed using copious       amounts, resulting in clearance with adequate visualization.      Two cratered gastric ulcers were found in the [place]. One had a       nonbleeding visible vessel (Forrest Class IIa) and the other more       distally had mild ooze from the edge of the ulcer and no visible vessel.       The largest lesion was 14 mm and the other was about 12 mm in largest       dimension. The lesion with the visible vessel was successfully injected       with 2.5 mL of a 1:100,000 solution of epinephrine for hemostasis.       Fulguration to ablate the lesion to prevent bleeding by bipolar probe       was successful. To further prevent bleeding post-intervention, two       hemostatic clips were successfully placed (MR conditional) on this       lesion and the separate ulcer was clipped close. There was no bleeding       at the end of the procedure.      Segmental moderate inflammation characterized by erosions, erythema,       friability and granularity was found in the entire examined stomach.       Biopsies were taken with a cold forceps for histology and Helicobacter       pylori testing.      Hematin (altered blood/coffee-ground-like material) was found in the       entire duodenum. Lavage of the area was performed using copious amounts,       resulting in clearance with adequate visualization.        No gross lesions were noted in the entire examined duodenum after the       lavage above was completed.      Hematin (altered blood/coffee-ground-like material) was found in the       jejunum. Lavage of the area was performed using copious amounts,       resulting in clearance with adequate visualization.      No gross lesions were noted in the proximal jejunum after lavage noted       above. Area was tattooed with an injection of Spot (carbon black) to       demarcate the distal extent of today's enteroscopy. Impression:               - No gross lesions in  esophagus proximally.                            Salmon-colored mucosa noted in distal esohagus -                            not biopsied due to concern for possible underlying                            varices.                           - Hematin (altered blood/coffee-ground-like                            material) in the entire stomach - lavaged.                           - 2 gastric ulcers as described above with a                            nonbleeding visible vessel (Forrest Class IIa).                           - Gastritis. Biopsied.                           - Blood in the entire examined duodenum - lavaged.                           - No gross lesions in the entire examined duodenum.                           - Jejunal blood - lavaged .                           - No gross lesions in the entire examined proximal                            jejunum. Tattooed distal extent. Moderate Sedation:      Not Applicable - Patient had care per Anesthesia. Recommendation:           - The patient will be observed post-procedure,                              until all discharge criteria are met.                           - Return patient to hospital ward for ongoing care.                           - Clear liquid diet initially and full liquid diet                            the rest of today in case he has recurrent bleeding.                           - Continue IV Protonix drip for 72 hours total.                           - Observe patient's clinical course.                           - Await pathology results.                           - Consider Liver Doppler U/S to evaluate portal                            flow and get further evaluation of liver                            echotexture.                           - If patient has transfusion dependent anemia                            develop while inhouse vs recurrent significant                            episodes of  hematemesis/coffee-ground emesis,                            please reach out to the GI team, as repeat EGD may                            be necessary.                           - Findings today explain his current clinical                            presentation, but with recent full enterosocpy                            without source just 1 month ago, still unclear                            etiology of  IDA that he has had. Will plan for                            further workup of that in future after healing of                            current ulcer disease. May need repeat VCE.                           - Repeat EGD in 49-month to evaluate for healing of                            gastric ulcers and significant gastritis and                            consideration of biopsy of the distal esophagus.                           - Hold Xarelto for at least 72 hours to allow                            things to heal and then may consider restart with                            heparin drip at that time. If it is felt he needs                            anticoagulation sooner then heparin drip without                            bolus in 12 hours (1000PM) could be considered.                            Previous discussions had been to consider stopping                            Xarelto altogether for the obscure IDA, but here is                            a more focal source not sure if we necessarily have                            to but will be based on how he does.                           - The findings and recommendations were discussed                            with the patient.                           - The findings and recommendations were discussed  with the referring physician. Procedure Code(s):        --- Professional ---                           [phone], Esophagogastroduodenoscopy, flexible,                            transoral; with biopsy,  single or multiple Diagnosis Code(s):        --- Professional ---                           K22.8, Other specified diseases of esophagus                           K92.2, Gastrointestinal hemorrhage, unspecified                           K25.4, Chronic or unspecified gastric ulcer with                            hemorrhage                           K29.70, Gastritis, unspecified, without bleeding                           D62, Acute posthemorrhagic anemia                           D50.0, Iron deficiency anemia secondary to blood                            loss (chronic)                           D50.9, Iron deficiency anemia, unspecified                           K92.0, Hematemesis CPT copyright 2019 [employer]. All rights reserved. The codes documented in this report are preliminary and upon coder review may  be revised to meet current compliance requirements. Justice Britain, MD 11/12/2020 11:15:34 AM Number of Addenda: 0

## 2020-11-12 NOTE — Anesthesia Procedure Notes (Signed)
Procedure Name: MAC Date/Time: 11/12/2020 10:02 AM Performed by: Victoriano Lain, CRNA Pre-anesthesia Checklist: Patient identified, Emergency Drugs available, Suction available, Patient being monitored and Timeout performed Patient Re-evaluated:Patient Re-evaluated prior to induction Oxygen Delivery Method: Simple face mask Dental Injury: Teeth and Oropharynx as per pre-operative assessment

## 2020-11-12 NOTE — Progress Notes (Signed)
Pt requesting to come off BIPAP to eat and drink.  Pt placed on 3 LPM [place] and tolerating well at this time.

## 2020-11-12 NOTE — Transfer of Care (Signed)
Immediate Anesthesia Transfer of Care Note  Patient: Jared [name]  Procedure(s) Performed: ESOPHAGOGASTRODUODENOSCOPY (EGD) (N/A ) BIOPSY SUBMUCOSAL TATTOO INJECTION HEMOSTASIS CLIP PLACEMENT HEMOSTASIS CONTROL  Patient Location: PACU and Endoscopy Unit  Anesthesia Type:MAC  Level of Consciousness: awake, alert , oriented and patient cooperative  Airway & Oxygen Therapy: Face mask and spontaneous breathing  Post-op Assessment: Report given to RN, Post -op Vital signs reviewed and stable and Patient moving all extremities  Post vital signs: Reviewed and stable  Last Vitals:  Vitals Value Taken Time  BP 93/43 11/12/20 1054  Temp    Pulse 99 11/12/20 1055  Resp 20 11/12/20 1055  SpO2 100 % 11/12/20 1055  Vitals shown include unvalidated device data.  Last Pain:  Vitals:   11/12/20 0948  TempSrc: Oral  PainSc: 0-No pain         Complications: No complications documented.

## 2020-11-12 NOTE — Progress Notes (Signed)
Notified [facility] on call that patient is still sedated from procedure today. It would be a high aspiration risk to give him PO meds tonight. Will continue to assess.

## 2020-11-12 NOTE — Anesthesia Postprocedure Evaluation (Signed)
Anesthesia Post Note  Patient: [name]  Procedure(s) Performed: ESOPHAGOGASTRODUODENOSCOPY (EGD) (N/A ) BIOPSY SUBMUCOSAL TATTOO INJECTION HEMOSTASIS CLIP PLACEMENT HEMOSTASIS CONTROL HOT HEMOSTASIS (ARGON PLASMA COAGULATION/BICAP) (N/A )     Patient location during evaluation: PACU Anesthesia Type: MAC Level of consciousness: awake and alert and oriented Pain management: pain level controlled Vital Signs Assessment: post-procedure vital signs reviewed and stable Respiratory status: spontaneous breathing, nonlabored ventilation and respiratory function stable Cardiovascular status: stable and blood pressure returned to baseline Postop Assessment: no apparent nausea or vomiting Anesthetic complications: no   No complications documented.  Last Vitals:  Vitals:   11/12/20 1100 11/12/20 1110  BP: (!) 117/48 (!) 106/41  Pulse: 100 95  Resp: (!) 24 20  Temp:    SpO2: 99% 92%    Last Pain:  Vitals:   11/12/20 1055  TempSrc: Axillary  PainSc: Asleep                 Jared [name] A.

## 2020-11-12 NOTE — Anesthesia Preprocedure Evaluation (Signed)
Anesthesia Evaluation  Patient identified by MRN, date of birth, ID band Patient awake    Reviewed: Allergy & Precautions, NPO status , Patient's Chart, lab work & pertinent test results  Airway Mallampati: II  TM Distance: >3 FB Neck ROM: Full    Dental  (+) Dental Advisory Given, Missing   Pulmonary pneumonia, resolved, COPD,  COPD inhaler, Current Smoker and Patient abstained from smoking.,    Pulmonary exam normal breath sounds clear to auscultation       Cardiovascular hypertension, Pt. on medications + CAD and +CHF  Normal cardiovascular exam Rhythm:Irregular Rate:Normal  EKG 11/02/20 NSR, pulmonary disease pattern, RAD, inferior Q waves  Echo 05/02/2018 Left ventricle: The cavity size was normal. Wall thickness was normal. Systolic function was mildly reduced. The estimated ejection fraction was in the range of 45% to 50%. Wall motion was normal; there were no regional wall motion abnormalities. Doppler parameters are consistent with abnormal left ventricular relaxation (grade 1 diastolic dysfunction).  - Left atrium: The atrium was mildly dilated.   Impressions:   - EF has improved when compared to prior (35%)    Neuro/Psych  Headaches, Hx/o Cluster HA's negative psych ROS   GI/Hepatic Neg liver ROS, Coffee ground emesis Hx/o GI AVM's   Endo/Other  Hyperlipidemia Hyperglycemia  Renal/GU negative Renal ROS  negative genitourinary   Musculoskeletal negative musculoskeletal ROS (+)   Abdominal Normal abdominal exam  (+)   Peds  Hematology negative hematology ROS (+) anemia , Xarelto therapy- last dose 11/10/20   Anesthesia Other Findings   Reproductive/Obstetrics                             Anesthesia Physical  Anesthesia Plan  ASA: III  Anesthesia Plan: MAC   Post-op Pain Management:    Induction: Intravenous  PONV Risk Score and Plan: 0 and Propofol infusion  and Treatment may vary due to age or medical condition  Airway Management Planned: Natural Airway and Nasal Cannula  Additional Equipment: None  Intra-op Plan:   Post-operative Plan:   Informed Consent: I have reviewed the patients History and Physical, chart, labs and discussed the procedure including the risks, benefits and alternatives for the proposed anesthesia with the patient or authorized representative who has indicated his/her understanding and acceptance.     Dental advisory given  Plan Discussed with: CRNA and Anesthesiologist  Anesthesia Plan Comments:         Anesthesia Quick Evaluation

## 2020-11-12 NOTE — Consult Note (Signed)
Referring Provider:  Dr. Nicanor Alcon, EDP Primary Care Physician:  [name], MD Primary Gastroenterologist:  Dr. Myrtie Neither  Reason for Consultation:  Hematemesis  HPI: Jared [name] is a 65 y.o. male with medical history significant for COPD, chronic hypoxic and hypercarbic respiratory failure on home O2, chronic combined systolic and diastolic CHF, paroxysmal atrial fibrillation on Xarelto, history of CVA, and history of GI bleeding, now presenting to the emergency department for evaluation of abdominal discomfort with dark red vomit.  He says that he was trying to move his bowels yesterday when he felt nauseated and vomited what looked like grape juice.  That only happened once but since he has been here he's had two episodes of bloody bowel movements.  Was having some generalized abdominal bloating but no pain per se.  Hgb down to 8.2 grams again this AM, which was the same as his discharge Hgb on 5/1 but it had increased to 11 grams on 5/24.  BUN is elevated at 50.  He is on PPI gtt.  Received a dose of erythromycin to help clear the stomach.  Xarelto is on hold with last dose 6/2 evening.  GI history:  Hospitalized 09/2020 for anemia.  Small bowel enteroscopy 10/09/2020 by Dr. Marina Goodell was normal.  08/18/2020 CT abdomen pelvis with contrast with no findings to explain the patient's clinical history, emphysema, aortic atherosclerosis; Dr. Myrtie Neither explained that likely the symptoms were due to hyperinflated lungs from his COPD, but there was nothing that he could do about this  Office visit 08/09/2020 with Dr. 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, despite extensive work-up, this problem remained complex and most likely cannot be definitively solved endoscopically, small bowel AVMs typically evanescent, making them difficult to diagnose and treat endoscopically, particularly when they are in the length of the small bowel, at that point patient was referred to hematology for close monitoring of hemoglobin and iron levels with periodic IV iron treatments, it was suspected that he would be more successful on oral iron and keeping his hemoglobin in a safer range, improving his functional status and trying to keep him out of the hospital, it was discussed that if above was done and he still had severe anemia then strong consideration should be given to permanently discontinuing his oral anticoagulation"  06/14/2019 capsule endoscopy: Complete capsule endoscopy with findings of gastric erythema/gastritis, no AVMs visualized, no active bleeding 10/28/2018 small bowel enteroscopy: Small AVM ablated, otherwise normal 09/28/2018 small bowel enteroscopy: Normal 09/26/2018 capsule endoscopy: Active bleeding 1 minute after entry into the duodenum, enteroscopy scheduled the next day   Past Medical History:  Diagnosis Date  . AVM (arteriovenous malformation)   . CAD (coronary artery disease)    a. LHC 5/12:  LAD 20, pLCx 20, pRCA 40, dRCA 40, EF 35%, diff HK  //  b. Myoview 4/16: Overall Impression: High risk stress nuclear study There is no evidence of  ischemia. There is severe LV dysfunction. LV Ejection Fraction: 30%. LV Wall Motion: There is global LV hypokinesis.   Marland Kitchen CAP (community acquired pneumonia) 09/2013  . Chronic combined systolic and diastolic CHF (congestive heart failure) (HCC)    a. Echo 4/16:Mild LVH, EF 40-45%,  diffuse HK //  b. Echo 8/17: EF 35-40%, diffuse HK, diastolic dysfunction, aortic sclerosis, trivial MR, moderate LAE, normal RVSF, moderate RAE, mild TR, PASP 42 mmHg // c. Echo 4/18: Mild concentric LVH, EF 30-35, normal wall motion, grade 1 diastolic dysfunction, PASP 49  . Chronic respiratory failure (HCC)   . Cluster headache    "hx; haven't had one in awhile" (01/09/2016)  . COPD (chronic obstructive pulmonary disease) (HCC)    [name] 01/09/2016  . History of CVA (cerebrovascular accident)   . Hypertension   . IDA (iron deficiency anemia)   . Moderate tobacco use disorder   . NICM (nonischemic cardiomyopathy) (HCC)   . Nicotine addiction   . Tobacco abuse     Past Surgical History:  Procedure Laterality Date  . CARDIAC CATHETERIZATION  10/2010   LM normal, LAD with 20% irregularities, LCX with 20%, RCA with 40% prox and 40% distal - EF of 35%  . CATARACT EXTRACTION, BILATERAL    . COLONOSCOPY W/ BIOPSIES AND POLYPECTOMY    . COLONOSCOPY WITH PROPOFOL N/A 09/06/2018   Procedure: COLONOSCOPY WITH PROPOFOL;  Surgeon: Tressia Danas, MD;  Location: [facility] ENDOSCOPY;  Service: Gastroenterology;  Laterality: N/A;  . ENTEROSCOPY N/A 09/28/2018   Procedure: ENTEROSCOPY;  Surgeon: Jeani Hawking, MD;  Location: [facility] ENDOSCOPY;  Service: Endoscopy;  Laterality: N/A;  . ENTEROSCOPY N/A 10/28/2018   Procedure: ENTEROSCOPY;  Surgeon: Tressia Danas, MD;  Location: [facility] ENDOSCOPY;  Service: Gastroenterology;  Laterality: N/A;  . ENTEROSCOPY N/A 10/09/2020   Procedure: ENTEROSCOPY;  Surgeon: Hilarie Fredrickson, MD;  Location: [facility] ENDOSCOPY;  Service: Endoscopy;  Laterality: N/A;  . ESOPHAGOGASTRODUODENOSCOPY (EGD) WITH PROPOFOL N/A 09/05/2018   Procedure: ESOPHAGOGASTRODUODENOSCOPY (EGD) WITH PROPOFOL;  Surgeon: Benancio Deeds, MD;  Location: [facility] ENDOSCOPY;  Service: Gastroenterology;  Laterality: N/A;  . EXCISION MASS HEAD    . GIVENS CAPSULE STUDY N/A 09/06/2018   Procedure: GIVENS CAPSULE STUDY;   Surgeon: Tressia Danas, MD;  Location: [facility] ENDOSCOPY;  Service: Gastroenterology;  Laterality: N/A;  . GIVENS CAPSULE STUDY N/A 09/26/2018   Procedure: GIVENS CAPSULE STUDY;  Surgeon: Beverley Fiedler, MD;  Location: [facility] ENDOSCOPY;  Service: Gastroenterology;  Laterality: N/A;  . GIVENS CAPSULE STUDY N/A 06/14/2019   Procedure: GIVENS CAPSULE STUDY;  Surgeon: Napoleon Form, MD;  Location: [facility] ENDOSCOPY;  Service: Endoscopy;  Laterality: N/A;  . HOT HEMOSTASIS N/A 10/28/2018   Procedure: HOT HEMOSTASIS (ARGON PLASMA COAGULATION/BICAP);  Surgeon: Tressia Danas, MD;  Location: [facility] ENDOSCOPY;  Service: Gastroenterology;  Laterality: N/A;  . INCISION AND DRAINAGE PERIRECTAL ABSCESS N/A 06/05/2017   Procedure: IRRIGATION AND DEBRIDEMENT PERIRECTAL ABSCESS;  Surgeon: Andria Meuse, MD;  Location: [facility] OR;  Service: General;  Laterality: N/A;  . VIDEO BRONCHOSCOPY Bilateral 05/08/2016   Procedure: VIDEO BRONCHOSCOPY WITH FLUORO;  Surgeon: Oretha Milch, MD;  Location: [facility] ENDOSCOPY;  Service: Cardiopulmonary;  Laterality: Bilateral;    Prior to Admission medications   Medication Sig Start Date End Date Taking? Authorizing Provider  albuterol (PROVENTIL) (2.5 MG/3ML) 0.083% nebulizer solution Take 3 mLs (2.5 mg total) by nebulization every 4 (four) hours as needed for wheezing or shortness of breath. 11/01/20  Yes Beard, Samantha N, DO  albuterol (VENTOLIN HFA) 108 (  90 Base) MCG/ACT inhaler INHALE 2 PUFFS INTO THE LUNGS EVERY 6 (SIX) HOURS AS NEEDED FOR WHEEZING OR SHORTNESS OF BREATH. 08/15/20 08/15/21 Yes Newlin, Enobong, MD  atorvastatin (LIPITOR) 80 MG tablet TAKE 1 TABLET EVERY DAY 08/03/20  Yes [name], MD  Budeson-Glycopyrrol-Formoterol 160-9-4.8 MCG/ACT AERO INHALE 2 PUFFS INTO THE LUNGS 2 (TWO) TIMES DAILY. 04/27/20 04/27/21 Yes Nyoka Cowden, MD  carvedilol (COREG) 3.125 MG tablet TAKE 1 TABLET (3.125 MG TOTAL) BY MOUTH 2 (TWO) TIMES DAILY. Patient taking differently: Take 3.125 mg by  mouth 2 (two) times daily. 06/14/20 06/14/21 Yes [name], MD  furosemide (LASIX) 40 MG tablet TAKE 1 TABLET EVERY DAY Patient taking differently: Take 40 mg by mouth daily. 08/03/20  Yes Newlin, Odette Horns, MD  gabapentin (NEURONTIN) 300 MG capsule TAKE 1 CAPSULE (300 MG TOTAL) BY MOUTH AT BEDTIME. Patient taking differently: Take 300 mg by mouth at bedtime. 03/23/20 03/23/21 Yes [name], MD  Misc. Devices MISC Portable oxygen concentrator. Diagnosis COPD. Patient taking differently: Inhale 3 L into the lungs. Portable oxygen concentrator. Diagnosis COPD. 12/05/18  Yes Newlin, Enobong, MD  nitroGLYCERIN (NITROSTAT) 0.4 MG SL tablet PLACE 1 TABLET (0.4 MG TOTAL) UNDER THE TONGUE EVERY 5 (FIVE) MINUTES AS NEEDED FOR CHEST PAIN. CALL 911 IF SYMPTOMS DO NOT RESOLVE AFTER 3 MINUTES Patient taking differently: Place 0.4 mg under the tongue every 5 (five) minutes as needed for chest pain. 06/14/20 06/14/21 Yes Newlin, Enobong, MD  XARELTO 20 MG TABS tablet Take 20 mg by mouth daily. 10/18/20  Yes [provider]  TUDORZA PRESSAIR 400 MCG/ACT AEPB INHALE 1 PUFF INTO THE LUNGS IN THE MORNING AND AT BEDTIME. 03/29/20 04/04/20  Nyoka Cowden, MD    Current Facility-Administered Medications  Medication Dose Route Frequency Provider Last Rate Last Admin  . 0.9 %  sodium chloride infusion   Intravenous Continuous Opyd, Lavone Neri, MD 85 mL/hr at 11/12/20 0800 Infusion Verify at 11/12/20 0800  . acetaminophen (TYLENOL) tablet 650 mg  650 mg Oral Q6H PRN Opyd, Lavone Neri, MD       Or  . acetaminophen (TYLENOL) suppository 650 mg  650 mg Rectal Q6H PRN Opyd, Lavone Neri, MD      . albuterol (PROVENTIL) (2.5 MG/3ML) 0.083% nebulizer solution 2.5 mg  2.5 mg Nebulization Q4H PRN Opyd, Lavone Neri, MD      . atorvastatin (LIPITOR) tablet 80 mg  80 mg Oral Daily Opyd, Lavone Neri, MD      . Chlorhexidine Gluconate Cloth 2 % PADS 6 each  6 each Topical Q0600 Opyd, Lavone Neri, MD   6 each at 11/12/20 0622  .  gabapentin (NEURONTIN) capsule 300 mg  300 mg Oral QHS Opyd, Timothy S, MD      . MEDLINE mouth rinse  15 mL Mouth Rinse BID Opyd, Lavone Neri, MD      . mometasone-formoterol (DULERA) 100-5 MCG/ACT inhaler 2 puff  2 puff Inhalation BID Opyd, Lavone Neri, MD   2 puff at 11/12/20 0815   And  . umeclidinium bromide (INCRUSE ELLIPTA) 62.5 MCG/INH 1 puff  1 puff Inhalation Daily Opyd, Lavone Neri, MD   1 puff at 11/12/20 0816  . ondansetron (ZOFRAN) tablet 4 mg  4 mg Oral Q6H PRN Opyd, Lavone Neri, MD       Or  . ondansetron (ZOFRAN) injection 4 mg  4 mg Intravenous Q6H PRN Opyd, Lavone Neri, MD      . pantoprazole (PROTONIX) 80 mg in sodium chloride 0.9 %  100 mL (0.8 mg/mL) infusion  8 mg/hr Intravenous Continuous Opyd, Timothy S, MD 10 mL/hr at 11/12/20 0815 8 mg/hr at 11/12/20 0815  . [START ON 11/15/2020] pantoprazole (PROTONIX) injection 40 mg  40 mg Intravenous Q12H Opyd, Timothy S, MD        Allergies as of 11/11/2020 - Review Complete 11/11/2020  Allergen Reaction Noted  . Lisinopril Cough 06/29/2014    Family History  Problem Relation Age of Onset  . Heart disease Mother   . Diabetes Mother   . Colon cancer Mother   . Liver cancer Mother   . Cancer Father        type unknown  . Diabetes Sister        x 2  . Diabetes Brother     Social History   Socioeconomic History  . Marital status: Single    Spouse name: Not on file  . Number of children: 1  . Years of education: Not on file  . Highest education level: Not on file  Occupational History  . Occupation: retired  Tobacco Use  . Smoking status: Current Some Day Smoker    Packs/day: 2.00    Years: 47.00    Pack years: 94.00    Types: Cigarettes  . Smokeless tobacco: Never Used  Vaping Use  . Vaping Use: Never used  Substance and Sexual Activity  . Alcohol use: Not Currently    Alcohol/week: 0.0 standard drinks    Comment: last drink was before xmas  . Drug use: No    Types: Cocaine, Marijuana    Comment: "nothing in 20  years"  . Sexual activity: Not Currently  Other Topics Concern  . Not on file  Social History Narrative   unemployed   Social Determinants of Health   Financial Resource Strain: Not on file  Food Insecurity: Not on file  Transportation Needs: Not on file  Physical Activity: Not on file  Stress: Not on file  Social Connections: Not on file  Intimate Partner Violence: Not on file    Review of Systems: ROS is O/W negative except as mentioned in HPI.  Physical Exam: Vital signs in last 24 hours: Temp:  [97.9 F (36.6 C)-98.5 F (36.9 C)] 97.9 F (36.6 C) (06/04 0300) Pulse Rate:  [82-101] 83 (06/04 0415) Resp:  [20-29] 22 (06/04 0415) BP: (92-131)/(56-119) 108/72 (06/04 0415) SpO2:  [97 %-100 %] 97 % (06/04 0415) Weight:  [98.4 kg-101.9 kg] 101.9 kg (06/04 0150) Last BM Date: 11/12/20 General:  Alert, Well-developed, well-nourished, pleasant and cooperative in NAD Head:  Normocephalic and atraumatic. Eyes:  Sclera clear, no icterus.  Conjunctiva pink. Ears:  Normal auditory acuity. Mouth:  No deformity or lesions.   Lungs:  Expiratory wheezing noted. Heart:  Regular rate and rhythm; no murmurs, clicks, rubs, or gallops. Abdomen:  Soft,nontender, BS active,nonpalp mass or hsm.   Rectal:  Deferred  Msk:  Symmetrical without gross deformities. Pulses:  Normal pulses noted. Extremities:  Without clubbing or edema. Neurologic:  Alert and oriented x 4;  grossly normal neurologically. Skin:  Intact without significant lesions or rashes. Psych:  Alert and cooperative. Normal mood and affect.  Intake/Output from previous day: 06/03 0701 - 06/04 0700 In: 142.3 [I.V.:42.3; IV Piggyback:100] Out: -  Intake/Output this shift: Total I/O In: 315.8 [I.V.:315.8] Out: 550 [Urine:550]  Lab Results: Recent Labs    11/11/20 2151 11/12/20 0359  WBC 12.9*  --   HGB 8.7* 8.2*  HCT 33.9* 32.4*  PLT 184  --      BMET Recent Labs    11/11/20 2151 11/12/20 0359  NA 143 143  K  4.7 4.5  CL 100 102  CO2 36* 38*  GLUCOSE 154* 135*  BUN 37* 50*  CREATININE 0.99 0.98  CALCIUM 8.0* 8.0*   LFT Recent Labs    11/11/20 2151  PROT 4.8*  ALBUMIN 2.8*  AST 40  ALT 42  ALKPHOS 50  BILITOT 0.7   IMPRESSION:  *Hematemesis/blood stools:  Suspect UGIB with elevated BUN as well.  Just had enteroscopy last month that was normal.  But has history of recurrent bleeding suspected due to AVMs. *Acute on chronic anemia:  Acutely due to GI blood loss.  Hgb down to 8.2 grams again this AM, which was the same as his discharge Hgb on 5/1 but it had increased to 11 grams on 5/24.  *Atrial fibrillation, on Xarelto:  Last dose was 6/2 evening. *COPD on home O2  PLAN: -EGD/enteroscopy this morning with Dr. Meridee Score. -Continue PPI gtt for now. -Monitor Hgb and transfuse prn.  Princella Pellegrini. Teyon Odette  11/12/2020, 8:40 AM

## 2020-11-12 NOTE — Interval H&P Note (Signed)
History and Physical Interval Note:  11/12/2020 9:46 AM  Jared [name]  has presented today for surgery, with the diagnosis of Coffee Ground Emesis/Anemia.  The various methods of treatment have been discussed with the patient and family. After consideration of risks, benefits and other options for treatment, the patient has consented to  Procedure(s): ESOPHAGOGASTRODUODENOSCOPY (EGD) (N/A) as a surgical intervention.  The patient's history has been reviewed, patient examined, no change in status, stable for surgery.  I have reviewed the patient's chart and labs.  Questions were answered to the patient's satisfaction.     [employer]

## 2020-11-12 NOTE — ED Provider Notes (Signed)
[facility]-EMERGENCY DEPT Provider Note   CSN: [id number] Arrival date & time: 11/11/20  2130     History Chief Complaint  Patient presents with  . Hematemesis    Jared [name] is a 65 y.o. male.  The history is provided by the patient.  Emesis Severity:  Mild Timing:  Rare Emesis appearance: dark blood  Progression:  Unchanged Chronicity:  New Recent urination:  Normal Context: not post-tussive   Relieved by:  Nothing Worsened by:  Nothing Ineffective treatments:  None tried Associated symptoms: no abdominal pain, no arthralgias, no chills and no fever   Risk factors: no alcohol use        Past Medical History:  Diagnosis Date  . AVM (arteriovenous malformation)   . CAD (coronary artery disease)    a. LHC 5/12:  LAD 20, pLCx 20, pRCA 40, dRCA 40, EF 35%, diff HK  //  b. Myoview 4/16: Overall Impression: High risk stress nuclear study There is no evidence of ischemia. There is severe LV dysfunction. LV Ejection Fraction: 30%. LV Wall Motion: There is global LV hypokinesis.   Marland Kitchen CAP (community acquired pneumonia) 09/2013  . Chronic combined systolic and diastolic CHF (congestive heart failure) (HCC)    a. Echo 4/16:Mild LVH, EF 40-45%, diffuse HK //  b. Echo 8/17: EF 35-40%, diffuse HK, diastolic dysfunction, aortic sclerosis, trivial MR, moderate LAE, normal RVSF, moderate RAE, mild TR, PASP 42 mmHg // c. Echo 4/18: Mild concentric LVH, EF 30-35, normal wall motion, grade 1 diastolic dysfunction, PASP 49  . Chronic respiratory failure (HCC)   . Cluster headache    "hx; haven't had one in awhile" (01/09/2016)  . COPD (chronic obstructive pulmonary disease) (HCC)    [name] 01/09/2016  . History of CVA (cerebrovascular accident)   . Hypertension   . IDA (iron deficiency anemia)   . Moderate tobacco use disorder   . NICM (nonischemic cardiomyopathy) (HCC)   . Nicotine addiction   . Tobacco abuse     Patient Active Problem List   Diagnosis Date  Noted  . Acute upper GI bleed 11/12/2020  . Acute GI bleeding 06/11/2019  . Syncope 06/11/2019  . Chronic right maxillary sinusitis 01/29/2019  . Chronic respiratory failure with hypoxia and hypercapnia (HCC) 01/01/2019  . Acute on chronic respiratory failure with hypoxia and hypercapnia (HCC) 11/10/2018  . GI bleed 10/27/2018  . Chronic anticoagulation 09/27/2018  . AV malformation of gastrointestinal tract   . Occult GI bleeding   . Heme positive stool   . Iron deficiency anemia   . Acute on chronic anemia 09/04/2018  . Noncompliance 06/14/2017  . Elevated LFTs 08/13/2016  . Chronic respiratory failure with hypoxia (HCC) 07/24/2016  . Atrial flutter (HCC)   . Hepatic congestion 07/02/2016  . Chronic combined systolic and diastolic CHF (congestive heart failure) (HCC) 07/01/2016  . COPD with acute exacerbation (HCC) 06/03/2016  . Prediabetes 05/14/2016  . Hemoptysis 05/06/2016  . COPD GOLD III/still smoking  02/29/2016  . Cigarette smoker 01/09/2016  . CAD (coronary artery disease) 10/07/2013  . 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 Centrilobular emphysema (HCC) 10/04/2013  . Essential hypertension     Past Surgical History:  Procedure Laterality Date  . CARDIAC CATHETERIZATION  10/2010   LM normal, LAD with 20% irregularities, LCX with 20%, RCA with 40% prox and 40% distal - EF of 35%  . CATARACT EXTRACTION, BILATERAL    . COLONOSCOPY W/ BIOPSIES AND POLYPECTOMY    . COLONOSCOPY WITH  PROPOFOL N/A 09/06/2018   Procedure: COLONOSCOPY WITH PROPOFOL;  Surgeon: Tressia Danas, MD;  Location: [facility] ENDOSCOPY;  Service: Gastroenterology;  Laterality: N/A;  . ENTEROSCOPY N/A 09/28/2018   Procedure: ENTEROSCOPY;  Surgeon: Jeani Hawking, MD;  Location: [facility] ENDOSCOPY;  Service: Endoscopy;  Laterality: N/A;  . ENTEROSCOPY N/A 10/28/2018   Procedure: ENTEROSCOPY;  Surgeon: Tressia Danas, MD;  Location: [facility] ENDOSCOPY;  Service: Gastroenterology;  Laterality: N/A;  .  ENTEROSCOPY N/A 10/09/2020   Procedure: ENTEROSCOPY;  Surgeon: Hilarie Fredrickson, MD;  Location: [facility] ENDOSCOPY;  Service: Endoscopy;  Laterality: N/A;  . ESOPHAGOGASTRODUODENOSCOPY (EGD) WITH PROPOFOL N/A 09/05/2018   Procedure: ESOPHAGOGASTRODUODENOSCOPY (EGD) WITH PROPOFOL;  Surgeon: Benancio Deeds, MD;  Location: [facility] ENDOSCOPY;  Service: Gastroenterology;  Laterality: N/A;  . EXCISION MASS HEAD    . GIVENS CAPSULE STUDY N/A 09/06/2018   Procedure: GIVENS CAPSULE STUDY;  Surgeon: Tressia Danas, MD;  Location: [facility] ENDOSCOPY;  Service: Gastroenterology;  Laterality: N/A;  . GIVENS CAPSULE STUDY N/A 09/26/2018   Procedure: GIVENS CAPSULE STUDY;  Surgeon: Beverley Fiedler, MD;  Location: [facility] ENDOSCOPY;  Service: Gastroenterology;  Laterality: N/A;  . GIVENS CAPSULE STUDY N/A 06/14/2019   Procedure: GIVENS CAPSULE STUDY;  Surgeon: Napoleon Form, MD;  Location: [facility] ENDOSCOPY;  Service: Endoscopy;  Laterality: N/A;  . HOT HEMOSTASIS N/A 10/28/2018   Procedure: HOT HEMOSTASIS (ARGON PLASMA COAGULATION/BICAP);  Surgeon: Tressia Danas, MD;  Location: [facility] ENDOSCOPY;  Service: Gastroenterology;  Laterality: N/A;  . INCISION AND DRAINAGE PERIRECTAL ABSCESS N/A 06/05/2017   Procedure: IRRIGATION AND DEBRIDEMENT PERIRECTAL ABSCESS;  Surgeon: Andria Meuse, MD;  Location: [facility] OR;  Service: General;  Laterality: N/A;  . VIDEO BRONCHOSCOPY Bilateral 05/08/2016   Procedure: VIDEO BRONCHOSCOPY WITH FLUORO;  Surgeon: Oretha Milch, MD;  Location: [facility] ENDOSCOPY;  Service: Cardiopulmonary;  Laterality: Bilateral;       Family History  Problem Relation Age of Onset  . Heart disease Mother   . Diabetes Mother   . Colon cancer Mother   . Liver cancer Mother   . Cancer Father        type unknown  . Diabetes Sister        x 2  . Diabetes Brother     Social History   Tobacco Use  . Smoking status: Current Some Day Smoker    Packs/day: 2.00    Years: 47.00    Pack years: 94.00    Types: Cigarettes   . Smokeless tobacco: Never Used  Vaping Use  . Vaping Use: Never used  Substance Use Topics  . Alcohol use: Not Currently    Alcohol/week: 0.0 standard drinks    Comment: last drink was before xmas  . Drug use: No    Types: Cocaine, Marijuana    Comment: "nothing in 20 years"    Home Medications Prior to Admission medications   Medication Sig Start Date End Date Taking? Authorizing Provider  albuterol (PROVENTIL) (2.5 MG/3ML) 0.083% nebulizer solution Take 3 mLs (2.5 mg total) by nebulization every 4 (four) hours as needed for wheezing or shortness of breath. 11/01/20  Yes Beard, Samantha N, DO  albuterol (VENTOLIN HFA) 108 (90 Base) MCG/ACT inhaler INHALE 2 PUFFS INTO THE LUNGS EVERY 6 (SIX) HOURS AS NEEDED FOR WHEEZING OR SHORTNESS OF BREATH. 08/15/20 08/15/21 Yes Newlin, Enobong, MD  atorvastatin (LIPITOR) 80 MG tablet TAKE 1 TABLET EVERY DAY 08/03/20  Yes [name], MD  Budeson-Glycopyrrol-Formoterol 160-9-4.8 MCG/ACT AERO INHALE 2 PUFFS INTO THE LUNGS 2 (TWO) TIMES DAILY.  04/27/20 04/27/21 Yes Nyoka Cowden, MD  carvedilol (COREG) 3.125 MG tablet TAKE 1 TABLET (3.125 MG TOTAL) BY MOUTH 2 (TWO) TIMES DAILY. Patient taking differently: Take 3.125 mg by mouth 2 (two) times daily. 06/14/20 06/14/21 Yes [name], MD  furosemide (LASIX) 40 MG tablet TAKE 1 TABLET EVERY DAY Patient taking differently: Take 40 mg by mouth daily. 08/03/20  Yes Newlin, Odette Horns, MD  gabapentin (NEURONTIN) 300 MG capsule TAKE 1 CAPSULE (300 MG TOTAL) BY MOUTH AT BEDTIME. Patient taking differently: Take 300 mg by mouth at bedtime. 03/23/20 03/23/21 Yes [name], MD  Misc. Devices MISC Portable oxygen concentrator. Diagnosis COPD. Patient taking differently: Inhale 3 L into the lungs. Portable oxygen concentrator. Diagnosis COPD. 12/05/18  Yes Newlin, Enobong, MD  nitroGLYCERIN (NITROSTAT) 0.4 MG SL tablet PLACE 1 TABLET (0.4 MG TOTAL) UNDER THE TONGUE EVERY 5 (FIVE) MINUTES AS NEEDED FOR CHEST PAIN.  CALL 911 IF SYMPTOMS DO NOT RESOLVE AFTER 3 MINUTES Patient taking differently: Place 0.4 mg under the tongue every 5 (five) minutes as needed for chest pain. 06/14/20 06/14/21 Yes Newlin, Enobong, MD  XARELTO 20 MG TABS tablet Take 20 mg by mouth daily. 10/18/20  Yes [provider]  ferrous sulfate 324 MG TBEC Take 1 tablet (324 mg total) by mouth daily. 03/31/20   Pricilla Riffle, MD  pantoprazole (PROTONIX) 40 MG tablet Take 1 tablet (40 mg total) by mouth 2 (two) times daily before a meal. Patient not taking: Reported on 11/01/2020 06/15/19   Sunnie Nielsen, DO  TUDORZA PRESSAIR 400 MCG/ACT AEPB INHALE 1 PUFF INTO THE LUNGS IN THE MORNING AND AT BEDTIME. 03/29/20 04/04/20  Nyoka Cowden, MD    Allergies    Lisinopril  Review of Systems   Review of Systems  Constitutional: Negative for chills and fever.  HENT: Negative for facial swelling.   Eyes: Negative for redness.  Respiratory: Negative for wheezing.   Cardiovascular: Negative for leg swelling.  Gastrointestinal: Positive for vomiting. Negative for abdominal pain.  Musculoskeletal: Negative for arthralgias.  Skin: Negative for rash.  Neurological: Negative for facial asymmetry.  Psychiatric/Behavioral: Negative for agitation.  All other systems reviewed and are negative.   Physical Exam Updated Vital Signs BP 98/75   Pulse 88   Temp 98.5 F (36.9 C) (Oral)   Resp (!) 25   Ht 6\' 3"  (1.905 m)   Wt 98.4 kg   SpO2 97%   BMI 27.12 kg/m   Physical Exam Vitals and nursing note reviewed. Exam conducted with a chaperone present.  Constitutional:      General: Jared [name] is not in acute distress.    Appearance: Normal appearance.  HENT:     Head: Normocephalic and atraumatic.     Nose: Nose normal.  Eyes:     Conjunctiva/sclera: Conjunctivae normal.     Pupils: Pupils are equal, round, and reactive to light.  Cardiovascular:     Rate and Rhythm: Normal rate and regular rhythm.     Pulses: Normal pulses.     Heart  sounds: Normal heart sounds.  Pulmonary:     Effort: Pulmonary effort is normal.     Breath sounds: Normal breath sounds.  Abdominal:     General: Abdomen is flat. Bowel sounds are normal.     Palpations: Abdomen is soft.     Tenderness: There is no abdominal tenderness. There is no guarding.  Genitourinary:    Rectum: Guaiac result positive.  Musculoskeletal:  General: Normal range of motion.     Cervical back: Normal range of motion and neck supple.  Skin:    General: Skin is warm and dry.     Capillary Refill: Capillary refill takes less than 2 seconds.  Neurological:     General: No focal deficit present.     Mental Status: Jared [name] is alert.     Deep Tendon Reflexes: Reflexes normal.  Psychiatric:        Mood and Affect: Mood normal.        Behavior: Behavior normal.     ED Results / Procedures / Treatments   Labs (all labs ordered are listed, but only abnormal results are displayed) Results for orders placed or performed during the hospital encounter of 11/11/20  Comprehensive metabolic panel  Result Value Ref Range   Sodium 143 135 - 145 mmol/L   Potassium 4.7 3.5 - 5.1 mmol/L   Chloride 100 98 - 111 mmol/L   CO2 36 (H) 22 - 32 mmol/L   Glucose, Bld 154 (H) 70 - 99 mg/dL   BUN 37 (H) 8 - 23 mg/dL   Creatinine, Ser 1.610.99 0.61 - 1.24 mg/dL   Calcium 8.0 (L) 8.9 - 10.3 mg/dL   Total Protein 4.8 (L) 6.5 - 8.1 g/dL   Albumin 2.8 (L) 3.5 - 5.0 g/dL   AST 40 15 - 41 U/L   ALT 42 0 - 44 U/L   Alkaline Phosphatase 50 38 - 126 U/L   Total Bilirubin 0.7 0.3 - 1.2 mg/dL   GFR, Estimated >09>60 >60>60 mL/min   Anion gap 7 5 - 15  CBC  Result Value Ref Range   WBC 12.9 (H) 4.0 - 10.5 K/uL   RBC 3.77 (L) 4.22 - 5.81 MIL/uL   Hemoglobin 8.7 (L) 13.0 - 17.0 g/dL   HCT 45.433.9 (L) 09.839.0 - 11.952.0 %   MCV 89.9 80.0 - 100.0 fL   MCH 23.1 (L) 26.0 - 34.0 pg   MCHC 25.7 (L) 30.0 - 36.0 g/dL   RDW 14.721.4 (H) 82.911.5 - 56.215.5 %   Platelets 184 150 - 400 K/uL   nRBC 0.9 (H) 0.0 - 0.2 %  POC  occult blood, ED Provider will collect  Result Value Ref Range   Fecal Occult Bld POSITIVE (A) NEGATIVE  [facility] and screen [facility]  Result Value Ref Range   ABO/RH(D) O POS    Antibody Screen NEG    Sample Expiration      11/14/2020,2359 Performed at [facility], [address] [place], [place] [address]    DG Chest 2 View  Result Date: 11/01/2020 CLINICAL DATA:  Shortness of breath. EXAM: CHEST - 2 VIEW COMPARISON:  CT 01/18/2020.  Chest x-ray 06/11/2019. FINDINGS: Mediastinum and hilar structures normal. Cardiomegaly with mild pulmonary venous congestion. Mild interstitial prominence suggesting mild interstitial edema. No pleural effusion or pneumothorax. Left costophrenic angle incompletely imaged. No pneumothorax. IMPRESSION: Cardiomegaly mild pulmonary venous congestion. Mild interstitial prominence suggesting mild interstitial edema. Electronically Signed   By: Maisie Fushomas  Register   On: 11/01/2020 10:07    Radiology No results found.  Procedures Procedures   Medications Ordered in ED Medications  pantoprazole (PROTONIX) 80 mg in sodium chloride 0.9 % 100 mL (0.8 mg/mL) infusion (8 mg/hr Intravenous New Bag/Given 11/11/20 2358)  pantoprazole (PROTONIX) injection 40 mg (has no administration in time range)  erythromycin (E-MYCIN) tablet 250 mg (has no administration in time range)  pantoprazole (PROTONIX) 80 mg in sodium  chloride 0.9 % 100 mL IVPB (80 mg Intravenous New Bag/Given 11/11/20 2357)    ED Course  I have reviewed the triage vital signs and the nursing notes.  Pertinent labs & imaging results that were available during my care of the patient were reviewed by me and considered in my medical decision making (see chart for details).   Consult placed for GI    Jared [name] was evaluated in Emergency Department on 11/12/2020 for the symptoms described in the history of present illness. Jared [name] was evaluated in the context of the global  COVID-19 pandemic, which necessitated consideration that the patient might be at risk for infection with the SARS-CoV-2 virus that causes COVID-19. Institutional protocols and algorithms that pertain to the evaluation of patients at risk for COVID-19 are in a state of rapid change based on information released by regulatory bodies including the CDC and federal and state organizations. These policies and algorithms were followed during the patient's care in the ED.  Final Clinical Impression(s) / ED Diagnoses Final diagnoses:  Hematemesis with nausea  admit to medicine    Byrd Terrero, MD 11/12/20 [id number]

## 2020-11-12 NOTE — H&P (View-Only) (Signed)
Referring Provider:  Dr. Nicanor Alcon, EDP Primary Care Physician:  [name], MD Primary Gastroenterologist:  Dr. Myrtie Neither  Reason for Consultation:  Hematemesis  HPI: Jared [name] is a 65 y.o. male with medical history significant for COPD, chronic hypoxic and hypercarbic respiratory failure on home O2, chronic combined systolic and diastolic CHF, paroxysmal atrial fibrillation on Xarelto, history of CVA, and history of GI bleeding, now presenting to the emergency department for evaluation of abdominal discomfort with dark red vomit.  He says that he was trying to move his bowels yesterday when he felt nauseated and vomited what looked like grape juice.  That only happened once but since he has been here he's had two episodes of bloody bowel movements.  Was having some generalized abdominal bloating but no pain per se.  Hgb down to 8.2 grams again this AM, which was the same as his discharge Hgb on 5/1 but it had increased to 11 grams on 5/24.  BUN is elevated at 50.  He is on PPI gtt.  Received a dose of erythromycin to help clear the stomach.  Xarelto is on hold with last dose 6/2 evening.  GI history:  Hospitalized 09/2020 for anemia.  Small bowel enteroscopy 10/09/2020 by Dr. Marina Goodell was normal.  08/18/2020 CT abdomen pelvis with contrast with no findings to explain the patient's clinical history, emphysema, aortic atherosclerosis; Dr. Myrtie Neither explained that likely the symptoms were due to hyperinflated lungs from his COPD, but there was nothing that he could do about this  Office visit 08/09/2020 with Dr. 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, despite extensive work-up, this problem remained complex and most likely cannot be definitively solved endoscopically, small bowel AVMs typically evanescent, making them difficult to diagnose and treat endoscopically, particularly when they are in the length of the small bowel, at that point patient was referred to hematology for close monitoring of hemoglobin and iron levels with periodic IV iron treatments, it was suspected that he would be more successful on oral iron and keeping his hemoglobin in a safer range, improving his functional status and trying to keep him out of the hospital, it was discussed that if above was done and he still had severe anemia then strong consideration should be given to permanently discontinuing his oral anticoagulation"  06/14/2019 capsule endoscopy: Complete capsule endoscopy with findings of gastric erythema/gastritis, no AVMs visualized, no active bleeding 10/28/2018 small bowel enteroscopy: Small AVM ablated, otherwise normal 09/28/2018 small bowel enteroscopy: Normal 09/26/2018 capsule endoscopy: Active bleeding 1 minute after entry into the duodenum, enteroscopy scheduled the next day   Past Medical History:  Diagnosis Date  . AVM (arteriovenous malformation)   . CAD (coronary artery disease)    a. LHC 5/12:  LAD 20, pLCx 20, pRCA 40, dRCA 40, EF 35%, diff HK  //  b. Myoview 4/16: Overall Impression: High risk stress nuclear study There is no evidence of  ischemia. There is severe LV dysfunction. LV Ejection Fraction: 30%. LV Wall Motion: There is global LV hypokinesis.   Marland Kitchen CAP (community acquired pneumonia) 09/2013  . Chronic combined systolic and diastolic CHF (congestive heart failure) (HCC)    a. Echo 4/16:Mild LVH, EF 40-45%,  diffuse HK //  b. Echo 8/17: EF 35-40%, diffuse HK, diastolic dysfunction, aortic sclerosis, trivial MR, moderate LAE, normal RVSF, moderate RAE, mild TR, PASP 42 mmHg // c. Echo 4/18: Mild concentric LVH, EF 30-35, normal wall motion, grade 1 diastolic dysfunction, PASP 49  . Chronic respiratory failure (HCC)   . Cluster headache    "hx; haven't had one in awhile" (01/09/2016)  . COPD (chronic obstructive pulmonary disease) (HCC)    [name] 01/09/2016  . History of CVA (cerebrovascular accident)   . Hypertension   . IDA (iron deficiency anemia)   . Moderate tobacco use disorder   . NICM (nonischemic cardiomyopathy) (HCC)   . Nicotine addiction   . Tobacco abuse     Past Surgical History:  Procedure Laterality Date  . CARDIAC CATHETERIZATION  10/2010   LM normal, LAD with 20% irregularities, LCX with 20%, RCA with 40% prox and 40% distal - EF of 35%  . CATARACT EXTRACTION, BILATERAL    . COLONOSCOPY W/ BIOPSIES AND POLYPECTOMY    . COLONOSCOPY WITH PROPOFOL N/A 09/06/2018   Procedure: COLONOSCOPY WITH PROPOFOL;  Surgeon: Tressia Danas, MD;  Location: [facility] ENDOSCOPY;  Service: Gastroenterology;  Laterality: N/A;  . ENTEROSCOPY N/A 09/28/2018   Procedure: ENTEROSCOPY;  Surgeon: Jeani Hawking, MD;  Location: [facility] ENDOSCOPY;  Service: Endoscopy;  Laterality: N/A;  . ENTEROSCOPY N/A 10/28/2018   Procedure: ENTEROSCOPY;  Surgeon: Tressia Danas, MD;  Location: [facility] ENDOSCOPY;  Service: Gastroenterology;  Laterality: N/A;  . ENTEROSCOPY N/A 10/09/2020   Procedure: ENTEROSCOPY;  Surgeon: Hilarie Fredrickson, MD;  Location: [facility] ENDOSCOPY;  Service: Endoscopy;  Laterality: N/A;  . ESOPHAGOGASTRODUODENOSCOPY (EGD) WITH PROPOFOL N/A 09/05/2018   Procedure: ESOPHAGOGASTRODUODENOSCOPY (EGD) WITH PROPOFOL;  Surgeon: Benancio Deeds, MD;  Location: [facility] ENDOSCOPY;  Service: Gastroenterology;  Laterality: N/A;  . EXCISION MASS HEAD    . GIVENS CAPSULE STUDY N/A 09/06/2018   Procedure: GIVENS CAPSULE STUDY;   Surgeon: Tressia Danas, MD;  Location: [facility] ENDOSCOPY;  Service: Gastroenterology;  Laterality: N/A;  . GIVENS CAPSULE STUDY N/A 09/26/2018   Procedure: GIVENS CAPSULE STUDY;  Surgeon: Beverley Fiedler, MD;  Location: [facility] ENDOSCOPY;  Service: Gastroenterology;  Laterality: N/A;  . GIVENS CAPSULE STUDY N/A 06/14/2019   Procedure: GIVENS CAPSULE STUDY;  Surgeon: Napoleon Form, MD;  Location: [facility] ENDOSCOPY;  Service: Endoscopy;  Laterality: N/A;  . HOT HEMOSTASIS N/A 10/28/2018   Procedure: HOT HEMOSTASIS (ARGON PLASMA COAGULATION/BICAP);  Surgeon: Tressia Danas, MD;  Location: [facility] ENDOSCOPY;  Service: Gastroenterology;  Laterality: N/A;  . INCISION AND DRAINAGE PERIRECTAL ABSCESS N/A 06/05/2017   Procedure: IRRIGATION AND DEBRIDEMENT PERIRECTAL ABSCESS;  Surgeon: Andria Meuse, MD;  Location: [facility] OR;  Service: General;  Laterality: N/A;  . VIDEO BRONCHOSCOPY Bilateral 05/08/2016   Procedure: VIDEO BRONCHOSCOPY WITH FLUORO;  Surgeon: Oretha Milch, MD;  Location: [facility] ENDOSCOPY;  Service: Cardiopulmonary;  Laterality: Bilateral;    Prior to Admission medications   Medication Sig Start Date End Date Taking? Authorizing Provider  albuterol (PROVENTIL) (2.5 MG/3ML) 0.083% nebulizer solution Take 3 mLs (2.5 mg total) by nebulization every 4 (four) hours as needed for wheezing or shortness of breath. 11/01/20  Yes Beard, Samantha N, DO  albuterol (VENTOLIN HFA) 108 (  90 Base) MCG/ACT inhaler INHALE 2 PUFFS INTO THE LUNGS EVERY 6 (SIX) HOURS AS NEEDED FOR WHEEZING OR SHORTNESS OF BREATH. 08/15/20 08/15/21 Yes Newlin, Enobong, MD  atorvastatin (LIPITOR) 80 MG tablet TAKE 1 TABLET EVERY DAY 08/03/20  Yes [name], MD  Budeson-Glycopyrrol-Formoterol 160-9-4.8 MCG/ACT AERO INHALE 2 PUFFS INTO THE LUNGS 2 (TWO) TIMES DAILY. 04/27/20 04/27/21 Yes Nyoka Cowden, MD  carvedilol (COREG) 3.125 MG tablet TAKE 1 TABLET (3.125 MG TOTAL) BY MOUTH 2 (TWO) TIMES DAILY. Patient taking differently: Take 3.125 mg by  mouth 2 (two) times daily. 06/14/20 06/14/21 Yes [name], MD  furosemide (LASIX) 40 MG tablet TAKE 1 TABLET EVERY DAY Patient taking differently: Take 40 mg by mouth daily. 08/03/20  Yes Newlin, Odette Horns, MD  gabapentin (NEURONTIN) 300 MG capsule TAKE 1 CAPSULE (300 MG TOTAL) BY MOUTH AT BEDTIME. Patient taking differently: Take 300 mg by mouth at bedtime. 03/23/20 03/23/21 Yes [name], MD  Misc. Devices MISC Portable oxygen concentrator. Diagnosis COPD. Patient taking differently: Inhale 3 L into the lungs. Portable oxygen concentrator. Diagnosis COPD. 12/05/18  Yes Newlin, Enobong, MD  nitroGLYCERIN (NITROSTAT) 0.4 MG SL tablet PLACE 1 TABLET (0.4 MG TOTAL) UNDER THE TONGUE EVERY 5 (FIVE) MINUTES AS NEEDED FOR CHEST PAIN. CALL 911 IF SYMPTOMS DO NOT RESOLVE AFTER 3 MINUTES Patient taking differently: Place 0.4 mg under the tongue every 5 (five) minutes as needed for chest pain. 06/14/20 06/14/21 Yes Newlin, Enobong, MD  XARELTO 20 MG TABS tablet Take 20 mg by mouth daily. 10/18/20  Yes [provider]  TUDORZA PRESSAIR 400 MCG/ACT AEPB INHALE 1 PUFF INTO THE LUNGS IN THE MORNING AND AT BEDTIME. 03/29/20 04/04/20  Nyoka Cowden, MD    Current Facility-Administered Medications  Medication Dose Route Frequency Provider Last Rate Last Admin  . 0.9 %  sodium chloride infusion   Intravenous Continuous Opyd, Lavone Neri, MD 85 mL/hr at 11/12/20 0800 Infusion Verify at 11/12/20 0800  . acetaminophen (TYLENOL) tablet 650 mg  650 mg Oral Q6H PRN Opyd, Lavone Neri, MD       Or  . acetaminophen (TYLENOL) suppository 650 mg  650 mg Rectal Q6H PRN Opyd, Lavone Neri, MD      . albuterol (PROVENTIL) (2.5 MG/3ML) 0.083% nebulizer solution 2.5 mg  2.5 mg Nebulization Q4H PRN Opyd, Lavone Neri, MD      . atorvastatin (LIPITOR) tablet 80 mg  80 mg Oral Daily Opyd, Lavone Neri, MD      . Chlorhexidine Gluconate Cloth 2 % PADS 6 each  6 each Topical Q0600 Opyd, Lavone Neri, MD   6 each at 11/12/20 0622  .  gabapentin (NEURONTIN) capsule 300 mg  300 mg Oral QHS Opyd, Timothy S, MD      . MEDLINE mouth rinse  15 mL Mouth Rinse BID Opyd, Lavone Neri, MD      . mometasone-formoterol (DULERA) 100-5 MCG/ACT inhaler 2 puff  2 puff Inhalation BID Opyd, Lavone Neri, MD   2 puff at 11/12/20 0815   And  . umeclidinium bromide (INCRUSE ELLIPTA) 62.5 MCG/INH 1 puff  1 puff Inhalation Daily Opyd, Lavone Neri, MD   1 puff at 11/12/20 0816  . ondansetron (ZOFRAN) tablet 4 mg  4 mg Oral Q6H PRN Opyd, Lavone Neri, MD       Or  . ondansetron (ZOFRAN) injection 4 mg  4 mg Intravenous Q6H PRN Opyd, Lavone Neri, MD      . pantoprazole (PROTONIX) 80 mg in sodium chloride 0.9 %  100 mL (0.8 mg/mL) infusion  8 mg/hr Intravenous Continuous Opyd, Lavone Neriimothy S, MD 10 mL/hr at 11/12/20 0815 8 mg/hr at 11/12/20 0815  . [START ON 11/15/2020] pantoprazole (PROTONIX) injection 40 mg  40 mg Intravenous Q12H Opyd, Lavone Neriimothy S, MD        Allergies as of 11/11/2020 - Review Complete 11/11/2020  Allergen Reaction Noted  . Lisinopril Cough 06/29/2014    Family History  Problem Relation Age of Onset  . Heart disease Mother   . Diabetes Mother   . Colon cancer Mother   . Liver cancer Mother   . Cancer Father        type unknown  . Diabetes Sister        x 2  . Diabetes Brother     Social History   Socioeconomic History  . Marital status: Single    Spouse name: Not on file  . Number of children: 1  . Years of education: Not on file  . Highest education level: Not on file  Occupational History  . Occupation: retired  Tobacco Use  . Smoking status: Current Some Day Smoker    Packs/day: 2.00    Years: 47.00    Pack years: 94.00    Types: Cigarettes  . Smokeless tobacco: Never Used  Vaping Use  . Vaping Use: Never used  Substance and Sexual Activity  . Alcohol use: Not Currently    Alcohol/week: 0.0 standard drinks    Comment: last drink was before xmas  . Drug use: No    Types: Cocaine, Marijuana    Comment: "nothing in 20  years"  . Sexual activity: Not Currently  Other Topics Concern  . Not on file  Social History Narrative   unemployed   Social Determinants of Health   Financial Resource Strain: Not on file  Food Insecurity: Not on file  Transportation Needs: Not on file  Physical Activity: Not on file  Stress: Not on file  Social Connections: Not on file  Intimate Partner Violence: Not on file    Review of Systems: ROS is O/W negative except as mentioned in HPI.  Physical Exam: Vital signs in last 24 hours: Temp:  [97.9 F (36.6 C)-98.5 F (36.9 C)] 97.9 F (36.6 C) (06/04 0300) Pulse Rate:  [82-101] 83 (06/04 0415) Resp:  [20-29] 22 (06/04 0415) BP: (92-131)/(56-119) 108/72 (06/04 0415) SpO2:  [97 %-100 %] 97 % (06/04 0415) Weight:  [98.4 kg-101.9 kg] 101.9 kg (06/04 0150) Last BM Date: 11/12/20 General:  Alert, Well-developed, well-nourished, pleasant and cooperative in NAD Head:  Normocephalic and atraumatic. Eyes:  Sclera clear, no icterus.  Conjunctiva pink. Ears:  Normal auditory acuity. Mouth:  No deformity or lesions.   Lungs:  Expiratory wheezing noted. Heart:  Regular rate and rhythm; no murmurs, clicks, rubs, or gallops. Abdomen:  Soft,nontender, BS active,nonpalp mass or hsm.   Rectal:  Deferred  Msk:  Symmetrical without gross deformities. Pulses:  Normal pulses noted. Extremities:  Without clubbing or edema. Neurologic:  Alert and oriented x 4;  grossly normal neurologically. Skin:  Intact without significant lesions or rashes. Psych:  Alert and cooperative. Normal mood and affect.  Intake/Output from previous day: 06/03 0701 - 06/04 0700 In: 142.3 [I.V.:42.3; IV Piggyback:100] Out: -  Intake/Output this shift: Total I/O In: 315.8 [I.V.:315.8] Out: 550 [Urine:550]  Lab Results: Recent Labs    11/11/20 2151 11/12/20 0359  WBC 12.9*  --   HGB 8.7* 8.2*  HCT 33.9* 32.4*  PLT 184  --  BMET Recent Labs    11/11/20 2151 11/12/20 0359  NA 143 143  K  4.7 4.5  CL 100 102  CO2 36* 38*  GLUCOSE 154* 135*  BUN 37* 50*  CREATININE 0.99 0.98  CALCIUM 8.0* 8.0*   LFT Recent Labs    11/11/20 2151  PROT 4.8*  ALBUMIN 2.8*  AST 40  ALT 42  ALKPHOS 50  BILITOT 0.7   IMPRESSION:  *Hematemesis/blood stools:  Suspect UGIB with elevated BUN as well.  Just had enteroscopy last month that was normal.  But has history of recurrent bleeding suspected due to AVMs. *Acute on chronic anemia:  Acutely due to GI blood loss.  Hgb down to 8.2 grams again this AM, which was the same as his discharge Hgb on 5/1 but it had increased to 11 grams on 5/24.  *Atrial fibrillation, on Xarelto:  Last dose was 6/2 evening. *COPD on home O2  PLAN: -EGD/enteroscopy this morning with Dr. Meridee Score. -Continue PPI gtt for now. -Monitor Hgb and transfuse prn.  Princella Pellegrini. Dannell Gortney  11/12/2020, 8:40 AM

## 2020-11-12 NOTE — Progress Notes (Addendum)
  PROGRESS NOTE    Jared [name]  NAT:[id number] DOB: 07-Jul-1955 DOA: 11/11/2020  PCP: [name], MD    LOS - 0    Patient admitted after midnight with acute upper GI bleeding.    Interval subjective: Taken for EGD this AM.  Post-procedure was slow waking up, so ultimately ABG was obtained which showed pCO over 100.  Started on BiPAP.  Otherwise stable.  Exam: sedated/somnolent, no acute distress, on BiPAP   Principal Problem:   Acute upper GI bleed Active Problems:   Essential hypertension   CAD (coronary artery disease)   COPD GOLD III/still smoking    Chronic combined systolic and diastolic CHF (congestive heart failure) (HCC)   Atrial flutter (HCC)   Chronic respiratory failure with hypoxia and hypercapnia (HCC)   Hematemesis with nausea    I have reviewed the full H&P by Dr. Antionette Char in detail, and I agree with the assessment and plan as outlined therein.  See EGD Op Note & GI consult note.  In addition: --BiPAP for hypercapnic respiratory failure with encephalopathy --Repeat ABG in couple hours  --Diet advancement per GI recs --Trend Hbg  No Charge    Pennie Banter, DO [facility]   If 7PM-7AM, please contact night-coverage [email or web address] 11/12/2020, 7:23 AM

## 2020-11-13 DIAGNOSIS — K250 Acute gastric ulcer with hemorrhage: Secondary | ICD-10-CM

## 2020-11-13 DIAGNOSIS — D62 Acute posthemorrhagic anemia: Secondary | ICD-10-CM

## 2020-11-13 LAB — BASIC METABOLIC PANEL
Anion gap: 4 — ABNORMAL LOW (ref 5–15)
BUN: 30 mg/dL — ABNORMAL HIGH (ref 8–23)
CO2: 36 mmol/L — ABNORMAL HIGH (ref 22–32)
Calcium: 8.2 mg/dL — ABNORMAL LOW (ref 8.9–10.3)
Chloride: 100 mmol/L (ref 98–111)
Creatinine, Ser: 1.08 mg/dL (ref 0.61–1.24)
GFR, Estimated: >60 mL/min (ref >60)
Glucose, Bld: 153 mg/dL — ABNORMAL HIGH (ref 70–99)
Potassium: 5.4 mmol/L — ABNORMAL HIGH (ref 3.5–5.1)
Sodium: 140 mmol/L (ref 135–145)

## 2020-11-13 LAB — HEMOGLOBIN AND HEMATOCRIT, BLOOD
HCT: 27.9 % — ABNORMAL LOW (ref 39.0–52.0)
HCT: 26.9 % — ABNORMAL LOW (ref 39.0–52.0)
Hemoglobin: 7 g/dL — ABNORMAL LOW (ref 13.0–17.0)
Hemoglobin: 7.1 g/dL — ABNORMAL LOW (ref 13.0–17.0)

## 2020-11-13 LAB — HEMOGLOBIN: Hemoglobin: 7.4 g/dL — ABNORMAL LOW (ref 13.0–17.0)

## 2020-11-13 LAB — HEMATOCRIT: HCT: 30.2 % — ABNORMAL LOW (ref 39.0–52.0)

## 2020-11-13 LAB — PREPARE RBC (CROSSMATCH)

## 2020-11-13 LAB — POTASSIUM: Potassium: 4.4 mmol/L (ref 3.5–5.1)

## 2020-11-13 MED ORDER — LORAZEPAM 1 MG PO TABS: 1.0000 mg | ORAL_TABLET | ORAL | Status: DC | PRN

## 2020-11-13 MED ORDER — LORAZEPAM 2 MG/ML IJ SOLN: 1.0000 mg | INTRAMUSCULAR | Status: DC | PRN

## 2020-11-13 MED ORDER — LORAZEPAM 2 MG/ML IJ SOLN
0.5000 mg | Freq: Once | INTRAMUSCULAR | Status: AC | PRN
  Administered 2020-11-13: 0.5 mg via INTRAVENOUS
  Filled 2020-11-13: qty 1

## 2020-11-13 MED ORDER — ADULT MULTIVITAMIN W/MINERALS CH
1.0000 | ORAL_TABLET | Freq: Every day | ORAL | Status: DC
  Administered 2020-11-13 – 2020-11-15 (×3): 1 via ORAL
  Filled 2020-11-13 (×3): qty 1

## 2020-11-13 MED ORDER — SODIUM CHLORIDE 0.9% IV SOLUTION: Freq: Once | INTRAVENOUS | Status: DC

## 2020-11-13 MED ORDER — NICOTINE 21 MG/24HR TD PT24
21.0000 mg | MEDICATED_PATCH | Freq: Every day | TRANSDERMAL | Status: DC
  Administered 2020-11-13 – 2020-11-15 (×3): 21 mg via TRANSDERMAL
  Filled 2020-11-13 (×3): qty 1

## 2020-11-13 MED ORDER — SODIUM ZIRCONIUM CYCLOSILICATE 5 G PO PACK: 5.0000 g | PACK | Freq: Once | ORAL | Status: DC

## 2020-11-13 MED ORDER — FUROSEMIDE 40 MG PO TABS
40.0000 mg | ORAL_TABLET | Freq: Every day | ORAL | Status: DC
  Administered 2020-11-14 – 2020-11-15 (×2): 40 mg via ORAL
  Filled 2020-11-13 (×2): qty 1

## 2020-11-13 NOTE — Hospital Course (Signed)
Per H&P by Dr. Minerva Areola: "Jared [name] is a 65 y.o. male with medical history significant for COPD, chronic hypoxic and hypercarbic respiratory failure, chronic combined systolic and diastolic CHF, paroxysmal atrial fibrillation on Xarelto, history of CVA, and history of GI bleeding, now presenting to the emergency department for evaluation of abdominal discomfort with dark red vomit.  Patient reports that he been in his usual state until yesterday evening when he developed some abdominal discomfort, nausea, and vomited what appeared to be dark red and maroon liquid.  He last took Xarelto the evening of 11/10/2020.  He does not have any abdominal pain in the emergency department.  He has not had any further episodes of vomiting.  He had not noted any melena or hematochezia.  He was admitted for symptomatic anemia just over a month ago and had small bowel enteroscopy with normal esophagus, stomach, duodenum, and visualized portions of jejunum.   ED Course: Upon arrival to the ED, patient is found to be afebrile, saturating well on his usual 3 L/min of supplemental oxygen, slightly tachypneic and tachycardic, and with blood pressure 92/60.  Chemistry panel is notable for BUN of 37, up from 13 late last month.  CBC features a leukocytosis to 12,900 and hemoglobin 8.7.  Gastroenterology was consulted by the ED physician and the patient was treated with IV Protonix and erythromycin.  He went on to have a loose stool that appeared bloody per nursing report."

## 2020-11-13 NOTE — Progress Notes (Addendum)
     [facility] Progress Note  CC:  Hematemesis  Subjective:  No further sign of bleeding per the patient and his nurse.  No abdominal pain.  He admits that he was using NSAIDs.  Objective:  Vital signs in last 24 hours: Temp:  [97.8 F (36.6 C)-98.3 F (36.8 C)] 98.3 F (36.8 C) (06/05 0755) Pulse Rate:  [87-107] 95 (06/04 1300) Resp:  [15-28] 15 (06/05 0400) BP: (93-130)/(41-87) 124/61 (06/05 0400) SpO2:  [92 %-100 %] 100 % (06/05 0400) Last BM Date: 11/12/20 General:  Alert, Well-developed, in NAD Heart:  Regular rate and rhythm; no murmurs Pulm:  Expiratory wheezing noted. Abdomen:  Soft, non-distended.  BS present.  Non-tender.  Extremities:  Without edema. Neurologic:  Alert and oriented x 4;  grossly normal neurologically. Psych:  Alert and cooperative. Normal mood and affect.  Intake/Output from previous day: 06/04 0701 - 06/05 0700 In: 1623.4 [P.O.:120; I.V.:1503.4] Out: 1600 [Urine:1600]  Lab Results: Recent Labs    11/11/20 2151 11/12/20 0359 11/12/20 1425 11/12/20 1958 11/13/20 0202  WBC 12.9*  --   --   --   --   HGB 8.7*   < > 8.4* 8.0* 7.4*  HCT 33.9*   < > 33.6* 31.3* 30.2*  PLT 184  --   --   --   --    < > = values in this interval not displayed.   BMET Recent Labs    11/11/20 2151 11/12/20 0359 11/13/20 0202  NA 143 143 140  K 4.7 4.5 5.4*  CL 100 102 100  CO2 36* 38* 36*  GLUCOSE 154* 135* 153*  BUN 37* 50* 30*  CREATININE 0.99 0.98 1.08  CALCIUM 8.0* 8.0* 8.2*   LFT Recent Labs    11/11/20 2151  PROT 4.8*  ALBUMIN 2.8*  AST 40  ALT 42  ALKPHOS 50  BILITOT 0.7   Assessment / Plan: *Hematemesis/bloody stools:  Had two gastric ulcers, one with non-bleeding visible vessel that was injected with epi, cauterized, and clipped. *Acute on chronic anemia:  Acutely due to GI blood loss.  Hgb down to 7.4 grams this AM.  With no signs of ongoing bleeding, suspect this is due to equilibration. *Atrial fibrillation, on  Xarelto at home:  Last dose was 6/2 evening. *COPD on home O2  -Continue PPI gtt x 72 hours. -Monitor Hgb and transfuse further prn.  Will recheck Hgb this afternoon and if stable then no reason from GI standpoint that he needs to remain in ICU/SD (nurse was asking). -Await biopsy results. -Repeat EGD in 3 months to ensure ulcer healing. -Anticoagulation recs as outlined extensively by Dr. Meridee Score in procedure report from 6/4. -Avoid NSAIDs completely. -Will advance to soft diet.   LOS: 1 day   Princella Pellegrini. Zehr  11/13/2020, 8:54 AM  GI ATTENDING  Interval history data reviewed.  Agree with interval progress note as outlined above.  Patient is stable after EGD with endoscopic hemostatic therapy for NSAID induced gastric ulcers.  On PPI.  Will advance diet as tolerated.  Not sure that he needs follow-up endoscopy in 3 months to assess for ulcer healing given normal EGD a few months ago and an obvious etiology for his ulcer disease (NSAIDs).  However, he should continue PPI as an outpatient and avoid NSAIDs.  Agree with advancing diet.  We will reassess in a.m.  Wilhemina Bonito. Eda Keys., M.D. [facility] Division of Gastroenterology

## 2020-11-13 NOTE — Progress Notes (Addendum)
PROGRESS NOTE    Jared [name]   HLK:[id number]  DOB: 11/05/1955  PCP: [name], MD    DOA: 11/11/2020 LOS: 1   Brief Narrative   Per H&P by Dr. Minerva Areola: "Jared [name] is a 65 y.o. male with medical history significant for COPD, chronic hypoxic and hypercarbic respiratory failure, chronic combined systolic and diastolic CHF, paroxysmal atrial fibrillation on Xarelto, history of CVA, and history of GI bleeding, now presenting to the emergency department for evaluation of abdominal discomfort with dark red vomit.  Patient reports that he been in his usual state until yesterday evening when he developed some abdominal discomfort, nausea, and vomited what appeared to be dark red and maroon liquid.  He last took Xarelto the evening of 11/10/2020.  He does not have any abdominal pain in the emergency department.  He has not had any further episodes of vomiting.  He had not noted any melena or hematochezia.  He was admitted for symptomatic anemia just over a month ago and had small bowel enteroscopy with normal esophagus, stomach, duodenum, and visualized portions of jejunum.   ED Course: Upon arrival to the ED, patient is found to be afebrile, saturating well on his usual 3 L/min of supplemental oxygen, slightly tachypneic and tachycardic, and with blood pressure 92/60.  Chemistry panel is notable for BUN of 37, up from 13 late last month.  CBC features a leukocytosis to 12,900 and hemoglobin 8.7.  Gastroenterology was consulted by the ED physician and the patient was treated with IV Protonix and erythromycin.  He went on to have a loose stool that appeared bloody per nursing report."    Significant Events: 6/4 - EGD - 2 gastric ulcers, single visible vessel treated, clipped  Assessment & Plan   Principal Problem:   Acute upper GI bleed Active Problems:   Essential hypertension   CAD (coronary artery disease)   COPD GOLD III/still smoking    Chronic combined systolic and diastolic CHF  (congestive heart failure) (HCC)   Atrial flutter (HCC)   Chronic respiratory failure with hypoxia and hypercapnia (HCC)   Hematemesis with nausea   Acute Upper GI Bleed due to Gastric Ulcer Acute on Chronic Anemia due to above 6/5:Hbg down 7.4 this AM, no further bleeding seen --GI following, see their recs --Trend Hbg, transfuse if < 7.0  --GI rec repeat EGD in 3 months --Absolutely no NSAID use --Diet advancement per GI --Continue PPI drip x 72 hours --Xarelto on hold.   Per GI hold Xarelto for at least 72 hours, then would resume on heparin drip and monitor for bleeding prior to resuming Xarelto.   -- Liver Doppler ultrasound to evaluate portal flow and liver architecture  Hyperkalemia -K5.4 on labs 6/5.  Will get a repeat level with next H&H and if still high give Lokelma.  Chronic combined systolic/diastolic CHF -appears euvolemic and well compensated.  Lasix held on admission with gentle IV hydration given in setting of acute GI bleeding and n.p.o. status. -- Resume home Lasix tomorrow  COPD with chronic respiratory failure with hypoxia and hypercapnia -stable on admission.  Post EGD, patient slow to awaken and found with PCO2 above 100.  Required BiPAP for short time.  Now off BiPAP and on his baseline oxygen requirement. -- Continue ICS, LABA, LAMA and as needed albuterol -- Supplemental oxygen to maintain O2 sat 88 to 93%  Paroxysmal A. Fib -CHA2DS2-VASc score is at least 5 (age, CVA x2, CAD, CHF).  Xarelto held on admission due to  active GI bleeding.  Anticoagulation will be held 72 hours, per GI. Resume with heparin drip and monitor for bleeding prior to transition to Xarelto.  CAD -stable, no active chest pain.  Continue statin  History of CVA -continue statin.  Xarelto on hold as above.  Tobacco abuse -nicotine patch ordered.  Patient counseled on importance of cessation for his overall health.      Patient BMI: Body mass index is 28.08 kg/m.   DVT prophylaxis:  SCDs Start: 11/12/20 2035   Diet:  Diet Orders (From admission, onward)    Start     Ordered   11/13/20 1041  DIET SOFT Room service appropriate? Yes; Fluid consistency: Thin  Diet effective now       Question Answer Comment  Room service appropriate? Yes   Fluid consistency: Thin      11/13/20 1040            Code Status: Full Code    Subjective 11/13/20    Patient seen in stepdown unit this morning.  He was awake and in no acute distress.  He was wanting to go home today and also have diet advanced.  He denies any abdominal pain, nausea vomiting or hematemesis.  Does not make eye contact or really engage in conversation. He denies previously needing BiPAP for his COPD.  Disposition Plan & Communication   Status is: Inpatient  Remains inpatient appropriate because:IV treatments appropriate due to intensity of illness or inability to take PO   Dispo: The patient is from: Home              Anticipated d/c is to: Home              Patient currently is not medically stable to d/c.   Difficult to place patient No     Consults, Procedures, Significant Events   Consultants:   Gastroenterology  Procedures:   EGD on 11/12/2020, see op note  Antimicrobials:  Anti-infectives (From admission, onward)   Start     Dose/Rate Route Frequency Ordered Stop   11/12/20 0030  erythromycin (E-MYCIN) tablet 250 mg        250 mg Oral STAT 11/12/20 0020 11/12/20 0125   11/12/20 0000  cefTRIAXone (ROCEPHIN) 1 g in sodium chloride 0.9 % 100 mL IVPB  Status:  Discontinued        1 g 200 mL/hr over 30 Minutes Intravenous  Once 11/11/20 2349 11/11/20 2351        Micro    Objective   Vitals:   11/13/20 0400 11/13/20 0755 11/13/20 0800 11/13/20 1200  BP: 124/61  134/82 (!) 133/52  Pulse:      Resp: 15  19 14   Temp:  98.3 F (36.8 C)  98.1 F (36.7 C)  TempSrc:  Oral  Oral  SpO2: 100%     Weight:      Height:        Intake/Output Summary (Last 24 hours) at 11/13/2020  1257 Last data filed at 11/13/2020 1200 Gross per 24 hour  Intake 420 ml  Output 750 ml  Net -330 ml   Filed Weights   11/11/20 2150 11/12/20 0150  Weight: 98.4 kg 101.9 kg    Physical Exam:  General exam: awake, alert, no acute distress HEENT: moist mucus membranes, hearing grossly normal  Respiratory system: CTAB, no wheezes, rales or rhonchi, normal respiratory effort. Cardiovascular system: normal S1/S2, RRR, no JVD, murmurs, rubs, gallops,  no pedal edema.   Gastrointestinal system:  soft, NT, ND, +bowel sounds. Central nervous system: A&O x3. no gross focal neurologic deficits, normal speech Skin: dry, intact, normal temperature Psychiatry: Irritable mood, flat affect, judgement and insight appear normal  Labs   Data Reviewed: I have personally reviewed following labs and imaging studies  CBC: Recent Labs  Lab 11/11/20 2151 11/12/20 0359 11/12/20 0840 11/12/20 1425 11/12/20 1958 11/13/20 0202  WBC 12.9*  --   --   --   --   --   HGB 8.7* 8.2* 8.4* 8.4* 8.0* 7.4*  HCT 33.9* 32.4* 33.2* 33.6* 31.3* 30.2*  MCV 89.9  --   --   --   --   --   PLT 184  --   --   --   --   --    Basic Metabolic Panel: Recent Labs  Lab 11/11/20 2151 11/12/20 0359 11/13/20 0202  NA 143 143 140  K 4.7 4.5 5.4*  CL 100 102 100  CO2 36* 38* 36*  GLUCOSE 154* 135* 153*  BUN 37* 50* 30*  CREATININE 0.99 0.98 1.08  CALCIUM 8.0* 8.0* 8.2*  MG  --  2.1  --    GFR: Estimated Creatinine Clearance: 85.9 mL/min (by C-G formula based on SCr of 1.08 mg/dL). Liver Function Tests: Recent Labs  Lab 11/11/20 2151  AST 40  ALT 42  ALKPHOS 50  BILITOT 0.7  PROT 4.8*  ALBUMIN 2.8*   No results for input(s): LIPASE, AMYLASE in the last 168 hours. No results for input(s): AMMONIA in the last 168 hours. Coagulation Profile: No results for input(s): INR, PROTIME in the last 168 hours. Cardiac Enzymes: No results for input(s): CKTOTAL, CKMB, CKMBINDEX, TROPONINI in the last 168 hours. BNP  (last 3 results) No results for input(s): PROBNP in the last 8760 hours. HbA1C: No results for input(s): HGBA1C in the last 72 hours. CBG: No results for input(s): GLUCAP in the last 168 hours. Lipid Profile: No results for input(s): CHOL, HDL, LDLCALC, TRIG, CHOLHDL, LDLDIRECT in the last 72 hours. Thyroid Function Tests: No results for input(s): TSH, T4TOTAL, FREET4, T3FREE, THYROIDAB in the last 72 hours. Anemia Panel: No results for input(s): VITAMINB12, FOLATE, FERRITIN, TIBC, IRON, RETICCTPCT in the last 72 hours. Sepsis Labs: No results for input(s): PROCALCITON, LATICACIDVEN in the last 168 hours.  Recent Results (from the past 240 hour(s))  Resp Panel by RT-PCR (Flu A&B, Covid) Nasopharyngeal Swab     Status: None   Collection Time: 11/11/20 11:06 PM   Specimen: Nasopharyngeal Swab; Nasopharyngeal(NP) swabs in vial transport medium  Result Value Ref Range Status   SARS Coronavirus 2 by RT PCR NEGATIVE NEGATIVE Final    Comment: (NOTE) SARS-CoV-2 target nucleic acids are NOT DETECTED.  The SARS-CoV-2 RNA is generally detectable in upper respiratory specimens during the acute phase of infection. The lowest concentration of SARS-CoV-2 viral copies this assay can detect is 138 copies/mL. A negative result does not preclude SARS-Cov-2 infection and should not be used as the sole basis for treatment or other patient management decisions. A negative result may occur with  improper specimen collection/handling, submission of specimen other than nasopharyngeal swab, presence of viral mutation(s) within the areas targeted by this assay, and inadequate number of viral copies(<138 copies/mL). A negative result must be combined with clinical observations, patient history, and epidemiological information. The expected result is Negative.  Fact Sheet for Patients:  BloggerCourse.com  Fact Sheet for Healthcare Providers:   SeriousBroker.it  This test is no t yet approved or  cleared by the [place] and  has been authorized for detection and/or diagnosis of SARS-CoV-2 by FDA under an Emergency Use Authorization (EUA). This EUA will remain  in effect (meaning this test can be used) for the duration of the COVID-19 declaration under Section 564(b)(1) of the Act, 21 U.S.C.section 360bbb-3(b)(1), unless the authorization is terminated  or revoked sooner.       Influenza A by PCR NEGATIVE NEGATIVE Final   Influenza B by PCR NEGATIVE NEGATIVE Final    Comment: (NOTE) The Xpert Xpress SARS-CoV-2/FLU/RSV plus assay is intended as an aid in the diagnosis of influenza from Nasopharyngeal swab specimens and should not be used as a sole basis for treatment. Nasal washings and aspirates are unacceptable for Xpert Xpress SARS-CoV-2/FLU/RSV testing.  Fact Sheet for Patients: BloggerCourse.com  Fact Sheet for Healthcare Providers: SeriousBroker.it  This test is not yet approved or cleared by the [place] FDA and has been authorized for detection and/or diagnosis of SARS-CoV-2 by FDA under an Emergency Use Authorization (EUA). This EUA will remain in effect (meaning this test can be used) for the duration of the COVID-19 declaration under Section 564(b)(1) of the Act, 21 U.S.C. section 360bbb-3(b)(1), unless the authorization is terminated or revoked.  Performed at [facility], [address], [place], [place] [address]   MRSA PCR Screening     Status: None   Collection Time: 11/12/20  1:46 AM   Specimen: Nasal Mucosa; Nasopharyngeal  Result Value Ref Range Status   MRSA by PCR NEGATIVE NEGATIVE Final    Comment:        The GeneXpert MRSA Assay (FDA approved for NASAL specimens only), is one component of a comprehensive MRSA colonization surveillance program. It is not intended to diagnose  MRSA infection nor to guide or monitor treatment for MRSA infections. Performed at [facility], [address] [place], [place] [address]       Imaging Studies   No results found.   Medications   Scheduled Meds: . atorvastatin  80 mg Oral Daily  . Chlorhexidine Gluconate Cloth  6 each Topical Q0600  . gabapentin  300 mg Oral QHS  . mouth rinse  15 mL Mouth Rinse BID  . mometasone-formoterol  2 puff Inhalation BID   And  . umeclidinium bromide  1 puff Inhalation Daily  . nicotine  21 mg Transdermal Daily  . [START ON 11/15/2020] pantoprazole  40 mg Intravenous Q12H   Continuous Infusions: . pantoprozole (PROTONIX) infusion 8 mg/hr (11/13/20 1200)       LOS: 1 day    Time spent: 30 minutes    Pennie Banter, DO [facility]  11/13/2020, 12:57 PM      If 7PM-7AM, please contact night-coverage. How to contact the [facility] Attending or Consulting provider 7A - 7P or covering provider during after hours 7P -7A, for this patient?    1. Check the care team in [facility] and look for a) attending/consulting [facility] provider listed and b) the [facility] team listed 2. Log into [email or web address] and use [facility]'s universal password to access. If you do not have the password, please contact the hospital operator. 3. Locate the [facility] provider you are looking for under [facility] and page to a number that you can be directly reached. 4. If you still have difficulty reaching the provider, please page the [facility] (Director on Call) for the Hospitalists listed on amion for assistance.

## 2020-11-14 ENCOUNTER — Encounter (HOSPITAL_COMMUNITY): Payer: Self-pay | Admitting: Gastroenterology

## 2020-11-14 ENCOUNTER — Inpatient Hospital Stay (HOSPITAL_COMMUNITY): Payer: Medicare HMO

## 2020-11-14 ENCOUNTER — Ambulatory Visit: Payer: Medicare HMO | Admitting: Family Medicine

## 2020-11-14 DIAGNOSIS — K250 Acute gastric ulcer with hemorrhage: Principal | ICD-10-CM

## 2020-11-14 DIAGNOSIS — D62 Acute posthemorrhagic anemia: Secondary | ICD-10-CM

## 2020-11-14 LAB — TYPE AND SCREEN
ABO/RH(D): O POS
Antibody Screen: NEGATIVE
Unit division: 0

## 2020-11-14 LAB — CBC
HCT: 28.2 % — ABNORMAL LOW (ref 39.0–52.0)
Hemoglobin: 7.4 g/dL — ABNORMAL LOW (ref 13.0–17.0)
MCH: 23.9 pg — ABNORMAL LOW (ref 26.0–34.0)
MCHC: 26.2 g/dL — ABNORMAL LOW (ref 30.0–36.0)
MCV: 91.3 fL (ref 80.0–100.0)
Platelets: 130 10*3/uL — ABNORMAL LOW (ref 150–400)
RBC: 3.09 MIL/uL — ABNORMAL LOW (ref 4.22–5.81)
RDW: 20.5 % — ABNORMAL HIGH (ref 11.5–15.5)
WBC: 11.4 10*3/uL — ABNORMAL HIGH (ref 4.0–10.5)
nRBC: 0.4 % — ABNORMAL HIGH (ref 0.0–0.2)

## 2020-11-14 LAB — HEMOGLOBIN AND HEMATOCRIT, BLOOD
HCT: 27.4 % — ABNORMAL LOW (ref 39.0–52.0)
HCT: 27.3 % — ABNORMAL LOW (ref 39.0–52.0)
HCT: 27.3 % — ABNORMAL LOW (ref 39.0–52.0)
Hemoglobin: 7.3 g/dL — ABNORMAL LOW (ref 13.0–17.0)
Hemoglobin: 7.3 g/dL — ABNORMAL LOW (ref 13.0–17.0)
Hemoglobin: 7.3 g/dL — ABNORMAL LOW (ref 13.0–17.0)

## 2020-11-14 LAB — BPAM RBC
Blood Product Expiration Date: 202206302359
ISSUE DATE / TIME: 202206051652
Unit Type and Rh: 5100

## 2020-11-14 LAB — BASIC METABOLIC PANEL
Anion gap: 4 — ABNORMAL LOW (ref 5–15)
BUN: 17 mg/dL (ref 8–23)
CO2: 37 mmol/L — ABNORMAL HIGH (ref 22–32)
Calcium: 8.2 mg/dL — ABNORMAL LOW (ref 8.9–10.3)
Chloride: 97 mmol/L — ABNORMAL LOW (ref 98–111)
Creatinine, Ser: 1 mg/dL (ref 0.61–1.24)
GFR, Estimated: >60 mL/min (ref >60)
Glucose, Bld: 110 mg/dL — ABNORMAL HIGH (ref 70–99)
Potassium: 4.2 mmol/L (ref 3.5–5.1)
Sodium: 138 mmol/L (ref 135–145)

## 2020-11-14 MED ORDER — FERROUS SULFATE 325 (65 FE) MG PO TABS
325.0000 mg | ORAL_TABLET | Freq: Two times a day (BID) | ORAL | Status: DC
  Administered 2020-11-14 – 2020-11-15 (×3): 325 mg via ORAL
  Filled 2020-11-14 (×3): qty 1

## 2020-11-14 NOTE — Progress Notes (Signed)
Pt seen, awake, alert, watching tv, no increased wob/respiratory distress noted or voiced by patient.  Hr85, RR20, SPO2 97% on 3Lnc.  Bipap not indicated at this time.

## 2020-11-14 NOTE — Progress Notes (Signed)
Progress Note  Chief Complaint:    GI bleed     ASSESSMENT / PLAN:    Brief History 65 yo male with past medical history significant for CAD /  chronic combined systolic and diastolic heart failure , HTN, PAF on Xarelto, history of CVA, COPD on home 02,chronic abdominal pain, small bowel AVMs . Hospitalized in April with anemia, small bowel enteroscopy 10/09/20 was normal.    # UGI bleed ( hematemesis / blood in stool)  / secondary to PUD. Small bowel endoscopy 6/4 >> Gastritis. Two small non-bleeding gastric ulcer with a non-bleeding visible vessel ( treated) and another gastric ulcer oozing from edges.  No gross lesions in esophagus. Salmon-colored mucosa noted in distal esohagus - not biopsied due to concern for possible underlying varices. --PPI infusion completed, now on BID IV PPI --Gastric biopsies pending --H.pylori breath test negative in Jan 2022 --Avoid NSAIDs Sounds like he was taking something at home that contained an NSAID.  --Hold Xarelto until at least 6/7 to allow things to heal. Per TRH note plan is to resume Heparin gtt and monitor for bleeding prior to transitioning to Xarelto.    # Acute on chronic anemia (IDA) secondary to above. Chronic anemia in the  past has been attributed to intestinal AVMs though none found on enteroscopy in April when he was hospitalized with anemia.  Iron defiency anemia at that time felt to be due to patient not taking prescribed iron supplements at home.   --Normal colonoscopy for anemia March 2020.  --Hgb is stable at 7.4 post unit of blood last evening --Patient still has not been taking oral iron at home. He needs BID oral iron indefinitely.  --? Follow up EGD in 3 months  # Abnormal appearing esophagus on EGD 6/4 (salmon colored mucosa). Biopsy not done because findings were suggestive of possible underlying varices (though normal appearing esophagus on small bowel endoscopy 5/1 and imaging studies haven't suggested  cirrhosis)  SUBJECTIVE:   No complaints. Ate all of his breakfast. No further N/V. No overt GI bleed    OBJECTIVE:    Scheduled inpatient medications:  . sodium chloride   Intravenous Once  . atorvastatin  80 mg Oral Daily  . Chlorhexidine Gluconate Cloth  6 each Topical Q0600  . furosemide  40 mg Oral Daily  . gabapentin  300 mg Oral QHS  . mouth rinse  15 mL Mouth Rinse BID  . mometasone-formoterol  2 puff Inhalation BID   And  . umeclidinium bromide  1 puff Inhalation Daily  . multivitamin with minerals  1 tablet Oral Daily  . nicotine  21 mg Transdermal Daily  . [START ON 11/15/2020] pantoprazole  40 mg Intravenous Q12H   Continuous inpatient infusions:  . pantoprozole (PROTONIX) infusion 8 mg/hr (11/14/20 0653)   PRN inpatient medications: acetaminophen **OR** acetaminophen, albuterol, LORazepam **OR** LORazepam, ondansetron **OR** ondansetron (ZOFRAN) IV  Vital signs in last 24 hours: Temp:  [98.1 F (36.7 C)-100.2 F (37.9 C)] 99 F (37.2 C) (06/06 0759) Pulse Rate:  [81-96] 81 (06/05 1947) Resp:  [13-23] 19 (06/06 0800) BP: (109-145)/(52-63) 145/58 (06/06 0800) SpO2:  [100 %] 100 % (06/06 0000) Weight:  [104.6 kg] 104.6 kg (06/06 0429) Last BM Date: 11/12/20  Intake/Output Summary (Last 24 hours) at 11/14/2020 0847 Last data filed at 11/14/2020 0653 Gross per 24 hour  Intake 638.74 ml  Output 2050 ml  Net -1411.26 ml     Physical Exam:  . General: Alert male  in NAD . Heart:  Regular rate.  . Pulmonary: Occas wheeze in chest.  . Abdomen: Soft, nondistended, nontender. Normal bowel sounds.  . Neurologic: Alert and oriented . Psych: Pleasant. Cooperative.   Filed Weights   11/11/20 2150 11/12/20 0150 11/14/20 0429  Weight: 98.4 kg 101.9 kg 104.6 kg    Intake/Output from previous day: 06/05 0701 - 06/06 0700 In: 648.7 [I.V.:233.7; Blood:415] Out: 2050 [Urine:2050] Intake/Output this shift: No intake/output data recorded.    Lab Results: Recent  Labs    11/11/20 2151 11/12/20 0359 11/13/20 1324 11/13/20 2138 11/14/20 0419  WBC 12.9*  --   --   --  11.4*  HGB 8.7*   < > 7.0* 7.1* 7.4*  HCT 33.9*   < > 27.9* 26.9* 28.2*  PLT 184  --   --   --  130*   < > = values in this interval not displayed.   BMET Recent Labs    11/12/20 0359 11/13/20 0202 11/13/20 1324 11/14/20 0419  NA 143 140  --  138  K 4.5 5.4* 4.4 4.2  CL 102 100  --  97*  CO2 38* 36*  --  37*  GLUCOSE 135* 153*  --  110*  BUN 50* 30*  --  17  CREATININE 0.98 1.08  --  1.00  CALCIUM 8.0* 8.2*  --  8.2*   LFT Recent Labs    11/11/20 2151  PROT 4.8*  ALBUMIN 2.8*  AST 40  ALT 42  ALKPHOS 50  BILITOT 0.7   PT/INR No results for input(s): LABPROT, INR in the last 72 hours. Hepatitis Panel No results for input(s): HEPBSAG, HCVAB, HEPAIGM, HEPBIGM in the last 72 hours.  US LIVER DOPPLER  Result Date: 11/14/2020 CLINICAL DATA:  Elevated LFTs EXAM: DUPLEX ULTRASOUND OF LIVER TECHNIQUE: Color and duplex Doppler ultrasound was performed to evaluate the hepatic in-flow and out-flow vessels. COMPARISON:  None. FINDINGS: Liver: Mildly heterogeneous and coarsened parenchymal echogenicity. Normal hepatic contour without nodularity. No focal lesion, mass or intrahepatic biliary ductal dilatation. Main Portal Vein size: 0.9 cm Portal Vein Velocities Main Prox:  55 cm/sec with normal hepatopetal directional flow. Main Mid: 48 cm/sec with normal hepatopetal directional flow. Main Dist:  48 cm/sec with normal hepatopetal directional flow. Right: 33 cm/sec with normal hepatopetal directional flow. Left: 33 cm/sec with normal hepatopetal directional flow. Hepatic Vein Velocities Right:  52 cm/sec Middle:  85 cm/sec Left:  78 cm/sec IVC: Present and patent with normal respiratory phasicity. Hepatic Artery Velocity:  222 cm/sec Splenic Vein Velocity:  52 cm/sec Spleen: 6.3 cm x 4.1 cm x 8.2 cm with a total volume of 111 cm^3 (411 cm^3 is upper limit normal) Portal Vein  Occlusion/Thrombus: No Splenic Vein Occlusion/Thrombus: No Ascites: None Varices: None IMPRESSION: 1. Mildly heterogeneous and coarsened echogenicity of the hepatic parenchyma. Differential considerations include mild hepatic steatosis versus intrinsic liver disease/fibrosis. 2. Patent portal and hepatic veins with normal directional flow. Electronically Signed   By: Malachy Moan M.D.   On: 11/14/2020 08:06        Principal Problem:   Acute upper GI bleed Active Problems:   Essential hypertension   CAD (coronary artery disease)   COPD GOLD III/still smoking    Chronic combined systolic and diastolic CHF (congestive heart failure) (HCC)   Atrial flutter (HCC)   Chronic respiratory failure with hypoxia and hypercapnia (HCC)   Hematemesis with nausea   Acute gastric ulcer with hemorrhage   Acute blood loss anemia  LOS: 2 days   Willette Cluster ,NP 11/14/2020, 8:47 AM

## 2020-11-14 NOTE — TOC Initial Note (Signed)
Transition of Care [facility]) - Initial/Assessment Note    Patient Details  Name: Jared [name] MRN: [id number] Date of Birth: Oct 28, 1955  Transition of Care [facility]) CM/SW Contact:    Golda Acre, RN Phone Number: 11/14/2020, 8:19 AM  Clinical Narrative:                 Assessment / Plan: *Hematemesis/bloody stools:  Had two gastric ulcers, one with non-bleeding visible vessel that was injected with epi, cauterized, and clipped. *Acute on chronic anemia: Acutely due to GI blood loss. Hgb down to 7.4 grams this AM.  With no signs of ongoing bleeding, suspect this is due to equilibration. *Atrial fibrillation, on Xarelto at home: Last dose was 6/2 evening. *COPD on home O2  -Continue PPI gtt x 72 hours. -Monitor Hgb and transfuse further prn.  Will recheck Hgb this afternoon and if stable then no reason from GI standpoint that he needs to remain in ICU/SD (nurse was asking). -Await biopsy results. -Repeat EGD in 3 months to ensure ulcer healing. -Anticoagulation recs as outlined extensively by Dr. Meridee Score in procedure report from 6/4. -Avoid NSAIDs completely. -Will advance to soft diet.  Expected Discharge Plan: Home/Self Care Barriers to Discharge: Continued Medical Work up   Patient Goals and CMS Choice Patient states their goals for this hospitalization and ongoing recovery are:: to go home      Expected Discharge Plan and Services Expected Discharge Plan: Home/Self Care   Discharge Planning Services: CM Consult   Living arrangements for the past 2 months: Single Family Home                                      Prior Living Arrangements/Services Living arrangements for the past 2 months: Single Family Home Lives with:: Self Patient language and need for interpreter reviewed:: Yes Do you feel safe going back to the place where you live?: Yes            Criminal Activity/Legal Involvement Pertinent to Current Situation/Hospitalization: No - Comment as  needed  Activities of Daily Living Home Assistive Devices/Equipment: None ADL Screening (condition at time of admission) Patient's cognitive ability adequate to safely complete daily activities?: Yes Is the patient deaf or have difficulty hearing?: No Does the patient have difficulty seeing, even when wearing glasses/contacts?: No Does the patient have difficulty concentrating, remembering, or making decisions?: No Patient able to express need for assistance with ADLs?: Yes Does the patient have difficulty dressing or bathing?: No Independently performs ADLs?: Yes (appropriate for developmental age) Does the patient have difficulty walking or climbing stairs?: No Weakness of Legs: None Weakness of Arms/Hands: None  Permission Sought/Granted                  Emotional Assessment Appearance:: Appears stated age Attitude/Demeanor/Rapport: Engaged Affect (typically observed): Calm Orientation: : Oriented to Place,Oriented to Self,Oriented to  Time,Oriented to Situation Alcohol / Substance Use: Not Applicable Psych Involvement: No (comment)  Admission diagnosis:  Acute upper GI bleed [K92.2] Hematemesis with nausea [K92.0] Patient Active Problem List   Diagnosis Date Noted  . Acute gastric ulcer with hemorrhage   . Acute blood loss anemia   . Acute upper GI bleed 11/12/2020  . Hematemesis with nausea   . Acute GI bleeding 06/11/2019  . Syncope 06/11/2019  . Chronic right maxillary sinusitis 01/29/2019  . Chronic respiratory failure with hypoxia and hypercapnia (HCC) 01/01/2019  .  Acute on chronic respiratory failure with hypoxia and hypercapnia (HCC) 11/10/2018  . GI bleed 10/27/2018  . Chronic anticoagulation 09/27/2018  . AV malformation of gastrointestinal tract   . Occult GI bleeding   . Heme positive stool   . Iron deficiency anemia   . Acute on chronic anemia 09/04/2018  . Noncompliance 06/14/2017  . Elevated LFTs 08/13/2016  . Chronic respiratory failure with  hypoxia (HCC) 07/24/2016  . Atrial flutter (HCC)   . Hepatic congestion 07/02/2016  . Chronic combined systolic and diastolic CHF (congestive heart failure) (HCC) 07/01/2016  . COPD with acute exacerbation (HCC) 06/03/2016  . Prediabetes 05/14/2016  . Hemoptysis 05/06/2016  . COPD GOLD III/still smoking  02/29/2016  . Cigarette smoker 01/09/2016  . CAD (coronary artery disease) 10/07/2013  . 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 Centrilobular emphysema (HCC) 10/04/2013  . Essential hypertension    PCP:  [name], MD Pharmacy:   [facility] [address] [place] [address] Phone: [phone] Fax: [phone]  [facility] - [place], [place] - [address] [place] [address] Phone: [phone] Fax: [phone]     Social Determinants of Health (SDOH) Interventions    Readmission Risk Interventions Readmission Risk Prevention Plan 11/11/2018  Medication Review (RN Care Manager) Complete  PCP or Specialist appointment within 3-5 days of discharge Complete  HRI or Home Care Consult Complete  SW Recovery Care/Counseling Consult Complete  Palliative Care Screening Not Applicable  Skilled Nursing Facility Not Applicable  Some recent data might be hidden

## 2020-11-14 NOTE — Progress Notes (Addendum)
PROGRESS NOTE    Jared [name]   ZOX:[id number]  DOB: 03/17/1956  PCP: Hoy RegisterNewlin, Enobong, MD    DOA: 11/11/2020 LOS: 2   Brief Narrative   Per H&P by Dr. Minerva Areolaypd: "Jared DageJohnny Ballweg is a 65 y.o. male with medical history significant for COPD, chronic hypoxic and hypercarbic respiratory failure, chronic combined systolic and diastolic CHF, paroxysmal atrial fibrillation on Xarelto, history of CVA, and history of GI bleeding, now presenting to the emergency department for evaluation of abdominal discomfort with dark red vomit.  Patient reports that he been in his usual state until yesterday evening when he developed some abdominal discomfort, nausea, and vomited what appeared to be dark red and maroon liquid.  He last took Xarelto the evening of 11/10/2020.  He does not have any abdominal pain in the emergency department.  He has not had any further episodes of vomiting.  He had not noted any melena or hematochezia.  He was admitted for symptomatic anemia just over a month ago and had small bowel enteroscopy with normal esophagus, stomach, duodenum, and visualized portions of jejunum.   ED Course: Upon arrival to the ED, patient is found to be afebrile, saturating well on his usual 3 L/min of supplemental oxygen, slightly tachypneic and tachycardic, and with blood pressure 92/60.  Chemistry panel is notable for BUN of 37, up from 13 late last month.  CBC features a leukocytosis to 12,900 and hemoglobin 8.7.  Gastroenterology was consulted by the ED physician and the patient was treated with IV Protonix and erythromycin.  He went on to have a loose stool that appeared bloody per nursing report."    Significant Events: 6/4 - EGD - 2 gastric ulcers, single visible vessel treated, clipped  Assessment & Plan   Principal Problem:   Acute upper GI bleed Active Problems:   Essential hypertension   CAD (coronary artery disease)   COPD GOLD III/still smoking    Chronic combined systolic and diastolic CHF  (congestive heart failure) (HCC)   Atrial flutter (HCC)   Chronic respiratory failure with hypoxia and hypercapnia (HCC)   Hematemesis with nausea   Acute gastric ulcer with hemorrhage   Acute blood loss anemia   Acute Upper GI Bleed due to Gastric Ulcer Acute on Chronic iron deficiency anemia due to above 6/5:Hbg down 7.4 this AM, no further bleeding seen 6/6: Hemoglobin stable --GI following, see their recs --Trend Hbg, transfuse if < 7.0  --GI may repeat EGD in 3 months --Absolutely no NSAID use --Diet advancement per GI --Continue PPI drip x 72 hours --Xarelto on hold.   --Started on oral iron supplement BID Per GI hold Xarelto for at least 72 hours, then would resume on heparin drip and monitor for bleeding prior to resuming Xarelto.    Hyperkalemia -resolved.  K5.4 on labs 6/5.  Will get a repeat level with next H&H and if still high give Lokelma.  Chronic combined systolic/diastolic CHF -appears euvolemic and well compensated.  Lasix, Coreg held on admission with gentle IV hydration given in setting of acute GI bleeding and n.p.o. status. -- Lasix resumed -- Monitor BP with Lasix before resuming Coreg  COPD with chronic respiratory failure with hypoxia and hypercapnia -stable on admission.  Post EGD, patient slow to awaken and found with PCO2 above 100.  Required BiPAP for short time.  Now off BiPAP and on his baseline oxygen requirement.  No wheezing to suggest acute exacerbation at this time. -- Continue ICS, LABA, LAMA and as needed albuterol --  Supplemental oxygen to maintain O2 sat 88 to 93% -- Monitor clinically  Paroxysmal A. Fib -CHA2DS2-VASc score is at least 5 (age, CVA x2, CAD, CHF).  Xarelto held on admission due to active GI bleeding.  Anticoagulation will be held 72 hours, per GI. Resume with heparin drip and monitor for bleeding prior to transition to Xarelto.  CAD -stable, no active chest pain.  Continue statin  History of CVA -continue statin.  Xarelto on  hold as above.  Tobacco abuse -nicotine patch ordered.  Patient counseled on importance of cessation for his overall health.      Patient BMI: Body mass index is 28.82 kg/m.   DVT prophylaxis: SCDs Start: 11/12/20 5456   Diet:  Diet Orders (From admission, onward)    Start     Ordered   11/13/20 1041  DIET SOFT Room service appropriate? Yes; Fluid consistency: Thin  Diet effective now       Question Answer Comment  Room service appropriate? Yes   Fluid consistency: Thin      11/13/20 1040            Code Status: Full Code    Subjective 11/14/20    Patient seen this morning reports feeling fine.  Tolerating soft diet.  Has not had any BMs.  No acute events reported.  Denies other acute complaints including fevers or chills, nausea vomiting or abdominal pain, chest pain or shortness of breath.  Disposition Plan & Communication   Status is: Inpatient  Remains inpatient appropriate because:IV treatments appropriate due to intensity of illness or inability to take PO   Dispo: The patient is from: Home              Anticipated d/c is to: Home              Patient currently is not medically stable to d/c.   Difficult to place patient No     Consults, Procedures, Significant Events   Consultants:   Gastroenterology  Procedures:   EGD on 11/12/2020, see op note  Antimicrobials:  Anti-infectives (From admission, onward)   Start     Dose/Rate Route Frequency Ordered Stop   11/12/20 0030  erythromycin (E-MYCIN) tablet 250 mg        250 mg Oral STAT 11/12/20 0020 11/12/20 0125   11/12/20 0000  cefTRIAXone (ROCEPHIN) 1 g in sodium chloride 0.9 % 100 mL IVPB  Status:  Discontinued        1 g 200 mL/hr over 30 Minutes Intravenous  Once 11/11/20 2349 11/11/20 2351        Micro    Objective   Vitals:   11/14/20 0900 11/14/20 1000 11/14/20 1200 11/14/20 1238  BP:   (!) 152/64   Pulse:      Resp: (!) 24 20    Temp:    99.3 F (37.4 C)  TempSrc:      SpO2:       Weight:      Height:        Intake/Output Summary (Last 24 hours) at 11/14/2020 1332 Last data filed at 11/14/2020 1217 Gross per 24 hour  Intake 598.74 ml  Output 3300 ml  Net -2701.26 ml   Filed Weights   11/11/20 2150 11/12/20 0150 11/14/20 0429  Weight: 98.4 kg 101.9 kg 104.6 kg    Physical Exam:  General exam: awake, alert, no acute distress Respiratory system: normal respiratory effort, on 3 L minute nasal cannula oxygen. Cardiovascular system: RRR, trace lower  extremity edema.   Central nervous system: A&O x3. no gross focal neurologic deficits, normal speech Psychiatry: Normal mood, flat affect, judgement and insight appear normal  Labs   Data Reviewed: I have personally reviewed following labs and imaging studies  CBC: Recent Labs  Lab 11/11/20 2151 11/12/20 0359 11/13/20 0202 11/13/20 1324 11/13/20 2138 11/14/20 0419 11/14/20 1124  WBC 12.9*  --   --   --   --  11.4*  --   HGB 8.7*   < > 7.4* 7.0* 7.1* 7.4* 7.3*  HCT 33.9*   < > 30.2* 27.9* 26.9* 28.2* 27.4*  MCV 89.9  --   --   --   --  91.3  --   PLT 184  --   --   --   --  130*  --    < > = values in this interval not displayed.   Basic Metabolic Panel: Recent Labs  Lab 11/11/20 2151 11/12/20 0359 11/13/20 0202 11/13/20 1324 11/14/20 0419  NA 143 143 140  --  138  K 4.7 4.5 5.4* 4.4 4.2  CL 100 102 100  --  97*  CO2 36* 38* 36*  --  37*  GLUCOSE 154* 135* 153*  --  110*  BUN 37* 50* 30*  --  17  CREATININE 0.99 0.98 1.08  --  1.00  CALCIUM 8.0* 8.0* 8.2*  --  8.2*  MG  --  2.1  --   --   --    GFR: Estimated Creatinine Clearance: 93.8 mL/min (by C-G formula based on SCr of 1 mg/dL). Liver Function Tests: Recent Labs  Lab 11/11/20 2151  AST 40  ALT 42  ALKPHOS 50  BILITOT 0.7  PROT 4.8*  ALBUMIN 2.8*   No results for input(s): LIPASE, AMYLASE in the last 168 hours. No results for input(s): AMMONIA in the last 168 hours. Coagulation Profile: No results for input(s): INR, PROTIME  in the last 168 hours. Cardiac Enzymes: No results for input(s): CKTOTAL, CKMB, CKMBINDEX, TROPONINI in the last 168 hours. BNP (last 3 results) No results for input(s): PROBNP in the last 8760 hours. HbA1C: No results for input(s): HGBA1C in the last 72 hours. CBG: No results for input(s): GLUCAP in the last 168 hours. Lipid Profile: No results for input(s): CHOL, HDL, LDLCALC, TRIG, CHOLHDL, LDLDIRECT in the last 72 hours. Thyroid Function Tests: No results for input(s): TSH, T4TOTAL, FREET4, T3FREE, THYROIDAB in the last 72 hours. Anemia Panel: No results for input(s): VITAMINB12, FOLATE, FERRITIN, TIBC, IRON, RETICCTPCT in the last 72 hours. Sepsis Labs: No results for input(s): PROCALCITON, LATICACIDVEN in the last 168 hours.  Recent Results (from the past 240 hour(s))  Resp Panel by RT-PCR (Flu A&B, Covid) Nasopharyngeal Swab     Status: None   Collection Time: 11/11/20 11:06 PM   Specimen: Nasopharyngeal Swab; Nasopharyngeal(NP) swabs in vial transport medium  Result Value Ref Range Status   SARS Coronavirus 2 by RT PCR NEGATIVE NEGATIVE Final    Comment: (NOTE) SARS-CoV-2 target nucleic acids are NOT DETECTED.  The SARS-CoV-2 RNA is generally detectable in upper respiratory specimens during the acute phase of infection. The lowest concentration of SARS-CoV-2 viral copies this assay can detect is 138 copies/mL. A negative result does not preclude SARS-Cov-2 infection and should not be used as the sole basis for treatment or other patient management decisions. A negative result may occur with  improper specimen collection/handling, submission of specimen other than nasopharyngeal swab, presence of viral mutation(s)  within the areas targeted by this assay, and inadequate number of viral copies(<138 copies/mL). A negative result must be combined with clinical observations, patient history, and epidemiological information. The expected result is Negative.  Fact Sheet for  Patients:  BloggerCourse.com  Fact Sheet for Healthcare Providers:  SeriousBroker.it  This test is no t yet approved or cleared by the [place] FDA and  has been authorized for detection and/or diagnosis of SARS-CoV-2 by FDA under an Emergency Use Authorization (EUA). This EUA will remain  in effect (meaning this test can be used) for the duration of the COVID-19 declaration under Section 564(b)(1) of the Act, 21 U.S.C.section 360bbb-3(b)(1), unless the authorization is terminated  or revoked sooner.       Influenza A by PCR NEGATIVE NEGATIVE Final   Influenza B by PCR NEGATIVE NEGATIVE Final    Comment: (NOTE) The Xpert Xpress SARS-CoV-2/FLU/RSV plus assay is intended as an aid in the diagnosis of influenza from Nasopharyngeal swab specimens and should not be used as a sole basis for treatment. Nasal washings and aspirates are unacceptable for Xpert Xpress SARS-CoV-2/FLU/RSV testing.  Fact Sheet for Patients: BloggerCourse.com  Fact Sheet for Healthcare Providers: SeriousBroker.it  This test is not yet approved or cleared by the [place] FDA and has been authorized for detection and/or diagnosis of SARS-CoV-2 by FDA under an Emergency Use Authorization (EUA). This EUA will remain in effect (meaning this test can be used) for the duration of the COVID-19 declaration under Section 564(b)(1) of the Act, 21 U.S.C. section 360bbb-3(b)(1), unless the authorization is terminated or revoked.  Performed at [facility], [address] [place], [place] [address]   MRSA PCR Screening     Status: None   Collection Time: 11/12/20  1:46 AM   Specimen: Nasal Mucosa; Nasopharyngeal  Result Value Ref Range Status   MRSA by PCR NEGATIVE NEGATIVE Final    Comment:        The GeneXpert MRSA Assay (FDA approved for NASAL specimens only), is one component of  a comprehensive MRSA colonization surveillance program. It is not intended to diagnose MRSA infection nor to guide or monitor treatment for MRSA infections. Performed at [facility], [address], [place], [place] [address]       Imaging Studies   US LIVER DOPPLER  Result Date: 11/14/2020 CLINICAL DATA:  Elevated LFTs EXAM: DUPLEX ULTRASOUND OF LIVER TECHNIQUE: Color and duplex Doppler ultrasound was performed to evaluate the hepatic in-flow and out-flow vessels. COMPARISON:  None. FINDINGS: Liver: Mildly heterogeneous and coarsened parenchymal echogenicity. Normal hepatic contour without nodularity. No focal lesion, mass or intrahepatic biliary ductal dilatation. Main Portal Vein size: 0.9 cm Portal Vein Velocities Main Prox:  55 cm/sec with normal hepatopetal directional flow. Main Mid: 48 cm/sec with normal hepatopetal directional flow. Main Dist:  48 cm/sec with normal hepatopetal directional flow. Right: 33 cm/sec with normal hepatopetal directional flow. Left: 33 cm/sec with normal hepatopetal directional flow. Hepatic Vein Velocities Right:  52 cm/sec Middle:  85 cm/sec Left:  78 cm/sec IVC: Present and patent with normal respiratory phasicity. Hepatic Artery Velocity:  222 cm/sec Splenic Vein Velocity:  52 cm/sec Spleen: 6.3 cm x 4.1 cm x 8.2 cm with a total volume of 111 cm^3 (411 cm^3 is upper limit normal) Portal Vein Occlusion/Thrombus: No Splenic Vein Occlusion/Thrombus: No Ascites: None Varices: None IMPRESSION: 1. Mildly heterogeneous and coarsened echogenicity of the hepatic parenchyma. Differential considerations include mild hepatic steatosis versus intrinsic liver disease/fibrosis. 2. Patent  portal and hepatic veins with normal directional flow. Electronically Signed   By: Malachy Moan M.D.   On: 11/14/2020 08:06     Medications   Scheduled Meds: . sodium chloride   Intravenous Once  . atorvastatin  80 mg Oral Daily  . Chlorhexidine Gluconate  Cloth  6 each Topical Q0600  . ferrous sulfate  325 mg Oral BID WC  . furosemide  40 mg Oral Daily  . gabapentin  300 mg Oral QHS  . mouth rinse  15 mL Mouth Rinse BID  . mometasone-formoterol  2 puff Inhalation BID   And  . umeclidinium bromide  1 puff Inhalation Daily  . multivitamin with minerals  1 tablet Oral Daily  . nicotine  21 mg Transdermal Daily  . [START ON 11/15/2020] pantoprazole  40 mg Intravenous Q12H   Continuous Infusions: . pantoprozole (PROTONIX) infusion 8 mg/hr (11/14/20 1153)       LOS: 2 days    Time spent: 25 minutes with greater than 50% spent at bedside and in coordination of care.    Pennie Banter, DO [facility]  11/14/2020, 1:32 PM      If 7PM-7AM, please contact night-coverage. How to contact the [facility] Attending or Consulting provider 7A - 7P or covering provider during after hours 7P -7A, for this patient?    1. Check the care team in [facility] and look for a) attending/consulting TR[facility] provider listed and b) the [facility] team listed 2. Log into [email or web address] and use [facility]'s universal password to access. If you do not have the password, please contact the hospital operator. 3. Locate the [facility] provider you are looking for under [facility] and page to a number that you can be directly reached. 4. If you still have difficulty reaching the provider, please page the [facility] (Director on Call) for the Hospitalists listed on amion for assistance.

## 2020-11-15 ENCOUNTER — Other Ambulatory Visit: Payer: Self-pay

## 2020-11-15 ENCOUNTER — Inpatient Hospital Stay (HOSPITAL_COMMUNITY): Payer: Medicare HMO

## 2020-11-15 DIAGNOSIS — B9681 Helicobacter pylori [H. pylori] as the cause of diseases classified elsewhere: Secondary | ICD-10-CM

## 2020-11-15 DIAGNOSIS — K279 Peptic ulcer, site unspecified, unspecified as acute or chronic, without hemorrhage or perforation: Secondary | ICD-10-CM

## 2020-11-15 LAB — HEMOGLOBIN AND HEMATOCRIT, BLOOD
HCT: 28.7 % — ABNORMAL LOW (ref 39.0–52.0)
Hemoglobin: 7.7 g/dL — ABNORMAL LOW (ref 13.0–17.0)

## 2020-11-15 LAB — CBC
HCT: 27.6 % — ABNORMAL LOW (ref 39.0–52.0)
Hemoglobin: 7.4 g/dL — ABNORMAL LOW (ref 13.0–17.0)
MCH: 24 pg — ABNORMAL LOW (ref 26.0–34.0)
MCHC: 26.8 g/dL — ABNORMAL LOW (ref 30.0–36.0)
MCV: 89.6 fL (ref 80.0–100.0)
Platelets: 126 10*3/uL — ABNORMAL LOW (ref 150–400)
RBC: 3.08 MIL/uL — ABNORMAL LOW (ref 4.22–5.81)
RDW: 19.9 % — ABNORMAL HIGH (ref 11.5–15.5)
WBC: 10.5 10*3/uL (ref 4.0–10.5)
nRBC: 0.2 % (ref 0.0–0.2)

## 2020-11-15 LAB — BASIC METABOLIC PANEL
Anion gap: 6 (ref 5–15)
BUN: 10 mg/dL (ref 8–23)
CO2: 36 mmol/L — ABNORMAL HIGH (ref 22–32)
Calcium: 8.2 mg/dL — ABNORMAL LOW (ref 8.9–10.3)
Chloride: 98 mmol/L (ref 98–111)
Creatinine, Ser: 1 mg/dL (ref 0.61–1.24)
GFR, Estimated: >60 mL/min (ref >60)
Glucose, Bld: 147 mg/dL — ABNORMAL HIGH (ref 70–99)
Potassium: 3.5 mmol/L (ref 3.5–5.1)
Sodium: 140 mmol/L (ref 135–145)

## 2020-11-15 LAB — SURGICAL PATHOLOGY

## 2020-11-15 MED ORDER — PANTOPRAZOLE SODIUM 40 MG IV SOLR
40.0000 mg | Freq: Two times a day (BID) | INTRAVENOUS | Status: DC
  Administered 2020-11-15: 40 mg via INTRAVENOUS
  Filled 2020-11-15: qty 40

## 2020-11-15 MED ORDER — DOXYCYCLINE HYCLATE 100 MG PO TABS
100.0000 mg | ORAL_TABLET | Freq: Two times a day (BID) | ORAL | Status: DC
  Administered 2020-11-15: 100 mg via ORAL
  Filled 2020-11-15: qty 1

## 2020-11-15 MED ORDER — GADOBUTROL 1 MMOL/ML IV SOLN
10.0000 mL | Freq: Once | INTRAVENOUS | Status: AC | PRN
  Administered 2020-11-15: 10 mL via INTRAVENOUS

## 2020-11-15 MED ORDER — RIVAROXABAN 20 MG PO TABS
20.0000 mg | ORAL_TABLET | Freq: Every day | ORAL | Status: DC
  Administered 2020-11-15: 20 mg via ORAL
  Filled 2020-11-15: qty 1

## 2020-11-15 MED ORDER — SENNOSIDES-DOCUSATE SODIUM 8.6-50 MG PO TABS
1.0000 | ORAL_TABLET | Freq: Two times a day (BID) | ORAL | Status: DC
  Administered 2020-11-15: 1 via ORAL
  Filled 2020-11-15: qty 1

## 2020-11-15 MED ORDER — ADULT MULTIVITAMIN W/MINERALS CH: 1.0000 | ORAL_TABLET | Freq: Every day | ORAL | Status: DC

## 2020-11-15 MED ORDER — PANTOPRAZOLE SODIUM 40 MG PO TBEC
40.0000 mg | DELAYED_RELEASE_TABLET | Freq: Two times a day (BID) | ORAL | 1 refills | Status: DC
  Filled 2020-11-15: qty 30, 15d supply, fill #0

## 2020-11-15 MED ORDER — DOXYCYCLINE HYCLATE 100 MG PO TABS
100.0000 mg | ORAL_TABLET | Freq: Two times a day (BID) | ORAL | 0 refills | Status: AC
  Filled 2020-11-15: qty 28, 14d supply, fill #0

## 2020-11-15 MED ORDER — FERROUS SULFATE 325 (65 FE) MG PO TABS
325.0000 mg | ORAL_TABLET | Freq: Two times a day (BID) | ORAL | 1 refills | Status: DC
  Filled 2020-11-15: qty 60, 30d supply, fill #0

## 2020-11-15 MED ORDER — BISACODYL 5 MG PO TBEC: 5.0000 mg | DELAYED_RELEASE_TABLET | Freq: Every day | ORAL | Status: DC | PRN

## 2020-11-15 MED ORDER — HEPARIN (PORCINE) 25000 UT/250ML-% IV SOLN
1450.0000 [IU]/h | INTRAVENOUS | Status: DC
  Administered 2020-11-15: 1450 [IU]/h via INTRAVENOUS
  Filled 2020-11-15: qty 250

## 2020-11-15 MED ORDER — TRAMADOL HCL 50 MG PO TABS
50.0000 mg | ORAL_TABLET | Freq: Four times a day (QID) | ORAL | Status: DC | PRN
  Administered 2020-11-15: 50 mg via ORAL
  Filled 2020-11-15: qty 1

## 2020-11-15 MED ORDER — BISMUTH SUBSALICYLATE 262 MG PO CHEW
524.0000 mg | CHEWABLE_TABLET | Freq: Four times a day (QID) | ORAL | Status: DC
  Filled 2020-11-15 (×2): qty 2

## 2020-11-15 MED ORDER — METRONIDAZOLE 250 MG PO TABS
250.0000 mg | ORAL_TABLET | Freq: Two times a day (BID) | ORAL | 0 refills | Status: AC
  Filled 2020-11-15: qty 28, 14d supply, fill #0

## 2020-11-15 MED ORDER — METRONIDAZOLE 500 MG PO TABS: 250.0000 mg | ORAL_TABLET | Freq: Two times a day (BID) | ORAL | Status: DC

## 2020-11-15 MED ORDER — POLYETHYLENE GLYCOL 3350 17 G PO PACK
17.0000 g | PACK | Freq: Every day | ORAL | Status: DC
  Administered 2020-11-15: 17 g via ORAL
  Filled 2020-11-15: qty 1

## 2020-11-15 MED ORDER — BISMUTH SUBSALICYLATE 262 MG PO CHEW
524.0000 mg | CHEWABLE_TABLET | Freq: Four times a day (QID) | ORAL | 0 refills | Status: DC
  Filled 2020-11-15: qty 30, 4d supply, fill #0

## 2020-11-15 NOTE — Progress Notes (Signed)
Patient discharged to home, IV removed and skin WNL. Patient states that he understands discharge instructions.

## 2020-11-15 NOTE — Progress Notes (Addendum)
Progress Note  Chief Complaint:    GI bleed     ASSESSMENT / PLAN:    Brief History: 65 yo male with past medical history significant for CAD /  chronic combined systolic and diastolic heart failure , HTN, PAF on Xarelto, history of CVA, COPD on home 02,chronic abdominal pain, small bowel AVMs . Hospitalized in April with anemia, small bowel enteroscopy 10/09/20 was normal.    # UGI bleed ( hematemesis / blood in stool)  / secondary to PUD. Small bowel endoscopy 6/4 >> Gastritis. Two small non-bleeding gastric ulcer with a non-bleeding visible vessel ( treated) and another gastric ulcer oozing from edges.  No gross lesions in esophagus. Salmon-colored mucosa noted in distal esohagus - not biopsied due to concern for possible underlying varices. --No further hematemesis or blood in stool. No BM in 2 days --Hgb stable at 7.4 --Upon discharge or in 48 hours can transition to PO BID and continue for 12 weeks.  --Gastric biopsies pending. ADDENDUM: Biopsies show chronic active gastritis with H.pylori. Will start treatment.Marland Kitchen He is for discharge today. Follow up appt for 7/13 at 9:00am   1) Protonix 20 mg 2 times a day x 14 d 2) Pepto Bismol 2 tabs (262 mg each) 4 times a day x 14 d 3) Metronidazole 250 mg TID times a day x 14 d 4) doxycycline 100 mg 2 times a day x 14 d After completion of treatment patient will need to discontinue all GERD medications and return for H.Pylori stool antigen study to check for eradication.  --Avoid NSAIDs Sounds like he was taking something at home that contained an NSAID.  --Xarelto still on hold. Started IV Heparin this am. Monitoring for bleeding prior to resuming Xarelto.    # Acute on chronic anemia (IDA) secondary to above. Chronic anemia in the  past has been attributed to intestinal AVMs though none found on enteroscopy in 10/09/20 when he was hospitalized with anemia.  Iron defiency anemia at that time felt to be due to patient not taking prescribed  iron supplements at home.   --Normal colonoscopy for anemia March 2020.  --Hgb is stable at 7.4 post unit of blood on 6/5 --Patient agitated about conversation over oral iron . He says he has been told by one provider to take it and by another to stop it. Therefore he has not been taking it.  I can understand his confusion. Hematology stopped oral iron in April 2021. He needs BID oral iron indefinitely given recurrent IDA / history of intestinal AVMs.  --? Follow up EGD in 3 months  # Abnormal appearing esophagus on EGD 6/4 (salmon colored mucosa). Biopsy not done because findings were suggestive of possible underlying varices (though normal appearing esophagus on small bowel endoscopy 5/1 and imaging studies haven't suggested cirrhosis).  # LUE weakness. He can use left hand but unable to lift up left arm, new this admission. No LLE weakness    SUBJECTIVE:   Feels okay except that he is unable to lift his left arm. He can use his left hand. No BMs in two days. Eating breakfast.      OBJECTIVE:    Scheduled inpatient medications:  . atorvastatin  80 mg Oral Daily  . Chlorhexidine Gluconate Cloth  6 each Topical Q0600  . ferrous sulfate  325 mg Oral BID WC  . furosemide  40 mg Oral Daily  . gabapentin  300 mg Oral QHS  . mouth rinse  15 mL  Mouth Rinse BID  . mometasone-formoterol  2 puff Inhalation BID   And  . umeclidinium bromide  1 puff Inhalation Daily  . multivitamin with minerals  1 tablet Oral Daily  . nicotine  21 mg Transdermal Daily  . pantoprazole  40 mg Intravenous Q12H   Continuous inpatient infusions:  . heparin 1,450 Units/hr (11/15/20 0820)   PRN inpatient medications: acetaminophen **OR** acetaminophen, albuterol, LORazepam **OR** LORazepam, ondansetron **OR** ondansetron (ZOFRAN) IV  Vital signs in last 24 hours: Temp:  [98.3 F (36.8 C)-99.9 F (37.7 C)] 98.3 F (36.8 C) (06/07 0400) Pulse Rate:  [78-96] 88 (06/07 0500) Resp:  [14-28] 19 (06/07  0800) BP: (122-152)/(44-103) 123/69 (06/07 0800) SpO2:  [91 %-99 %] 91 % (06/07 0800) Weight:  [101.8 kg] 101.8 kg (06/07 0500) Last BM Date: 11/13/20  Intake/Output Summary (Last 24 hours) at 11/15/2020 0859 Last data filed at 11/15/2020 0540 Gross per 24 hour  Intake 201.3 ml  Output 4250 ml  Net -4048.7 ml     Physical Exam:  . General: Alert male in NAD . Heart:  Regular rate. No lower extremity edema . Pulmonary: Normal respiratory effort . Abdomen: Soft, nondistended, nontender. Normal bowel sounds.  . Neurologic: Alert and oriented . Psych:  Cooperative.   Filed Weights   11/12/20 0150 11/14/20 0429 11/15/20 0500  Weight: 101.9 kg 104.6 kg 101.8 kg    Intake/Output from previous day: 06/06 0701 - 06/07 0700 In: 201.3 [I.V.:201.3] Out: 4250 [Urine:4250] Intake/Output this shift: No intake/output data recorded.    Lab Results: Recent Labs    11/14/20 0419 11/14/20 1124 11/14/20 1724 11/14/20 2303 11/15/20 0246  WBC 11.4*  --   --   --  10.5  HGB 7.4*   < > 7.3* 7.3* 7.4*  HCT 28.2*   < > 27.3* 27.3* 27.6*  PLT 130*  --   --   --  126*   < > = values in this interval not displayed.   BMET Recent Labs    11/13/20 0202 11/13/20 1324 11/14/20 0419 11/15/20 0246  NA 140  --  138 140  K 5.4* 4.4 4.2 3.5  CL 100  --  97* 98  CO2 36*  --  37* 36*  GLUCOSE 153*  --  110* 147*  BUN 30*  --  17 10  CREATININE 1.08  --  1.00 1.00  CALCIUM 8.2*  --  8.2* 8.2*   LFT No results for input(s): PROT, ALBUMIN, AST, ALT, ALKPHOS, BILITOT, BILIDIR, IBILI in the last 72 hours. PT/INR No results for input(s): LABPROT, INR in the last 72 hours. Hepatitis Panel No results for input(s): HEPBSAG, HCVAB, HEPAIGM, HEPBIGM in the last 72 hours.  US LIVER DOPPLER  Result Date: 11/14/2020 CLINICAL DATA:  Elevated LFTs EXAM: DUPLEX ULTRASOUND OF LIVER TECHNIQUE: Color and duplex Doppler ultrasound was performed to evaluate the hepatic in-flow and out-flow vessels.  COMPARISON:  None. FINDINGS: Liver: Mildly heterogeneous and coarsened parenchymal echogenicity. Normal hepatic contour without nodularity. No focal lesion, mass or intrahepatic biliary ductal dilatation. Main Portal Vein size: 0.9 cm Portal Vein Velocities Main Prox:  55 cm/sec with normal hepatopetal directional flow. Main Mid: 48 cm/sec with normal hepatopetal directional flow. Main Dist:  48 cm/sec with normal hepatopetal directional flow. Right: 33 cm/sec with normal hepatopetal directional flow. Left: 33 cm/sec with normal hepatopetal directional flow. Hepatic Vein Velocities Right:  52 cm/sec Middle:  85 cm/sec Left:  78 cm/sec IVC: Present and patent with normal respiratory phasicity.  Hepatic Artery Velocity:  222 cm/sec Splenic Vein Velocity:  52 cm/sec Spleen: 6.3 cm x 4.1 cm x 8.2 cm with a total volume of 111 cm^3 (411 cm^3 is upper limit normal) Portal Vein Occlusion/Thrombus: No Splenic Vein Occlusion/Thrombus: No Ascites: None Varices: None IMPRESSION: 1. Mildly heterogeneous and coarsened echogenicity of the hepatic parenchyma. Differential considerations include mild hepatic steatosis versus intrinsic liver disease/fibrosis. 2. Patent portal and hepatic veins with normal directional flow. Electronically Signed   By: Malachy Moan M.D.   On: 11/14/2020 08:06    Principal Problem:   Acute upper GI bleed Active Problems:   Essential hypertension   CAD (coronary artery disease)   COPD GOLD III/still smoking    Chronic combined systolic and diastolic CHF (congestive heart failure) (HCC)   Atrial flutter (HCC)   Chronic respiratory failure with hypoxia and hypercapnia (HCC)   Hematemesis with nausea   Acute gastric ulcer with hemorrhage   Acute blood loss anemia     LOS: 3 days   Willette Cluster ,NP 11/15/2020, 8:59 AM

## 2020-11-15 NOTE — Progress Notes (Signed)
ANTICOAGULATION CONSULT NOTE - Initial Consult  Pharmacy Consult for heparin Indication: atrial fibrillation. Bridge therapy  Allergies  Allergen Reactions  . Lisinopril Cough    Patient Measurements: Height: 6\' 3"  (190.5 cm) Weight: 101.8 kg (224 lb 6.9 oz) IBW/kg (Calculated) : 84.5 Heparin Dosing Weight:   Vital Signs: Temp: 98.3 F (36.8 C) (06/07 0400) Temp Source: Oral (06/07 0400) BP: 122/72 (06/07 0400) Pulse Rate: 88 (06/07 0500)  Labs: Recent Labs    11/13/20 0202 11/13/20 1324 11/14/20 0419 11/14/20 1124 11/14/20 1724 11/14/20 2303 11/15/20 0246  HGB 7.4*   < > 7.4*   < > 7.3* 7.3* 7.4*  HCT 30.2*   < > 28.2*   < > 27.3* 27.3* 27.6*  PLT  --   --  130*  --   --   --  126*  CREATININE 1.08  --  1.00  --   --   --  1.00   < > = values in this interval not displayed.    Estimated Creatinine Clearance: 92.7 mL/min (by C-G formula based on SCr of 1 mg/dL).   Medical History: Past Medical History:  Diagnosis Date  . AVM (arteriovenous malformation)   . CAD (coronary artery disease)    a. LHC 5/12:  LAD 20, pLCx 20, pRCA 40, dRCA 40, EF 35%, diff HK  //  b. Myoview 4/16: Overall Impression: High risk stress nuclear study There is no evidence of ischemia. There is severe LV dysfunction. LV Ejection Fraction: 30%. LV Wall Motion: There is global LV hypokinesis.   5/16 CAP (community acquired pneumonia) 09/2013  . Chronic combined systolic and diastolic CHF (congestive heart failure) (HCC)    a. Echo 4/16:Mild LVH, EF 40-45%, diffuse HK //  b. Echo 8/17: EF 35-40%, diffuse HK, diastolic dysfunction, aortic sclerosis, trivial MR, moderate LAE, normal RVSF, moderate RAE, mild TR, PASP 42 mmHg // c. Echo 4/18: Mild concentric LVH, EF 30-35, normal wall motion, grade 1 diastolic dysfunction, PASP 49  . Chronic respiratory failure (HCC)   . Cluster headache    "hx; haven't had one in awhile" (01/09/2016)  . COPD (chronic obstructive pulmonary disease) (HCC)     01/11/2016 01/09/2016  . History of CVA (cerebrovascular accident)   . Hypertension   . IDA (iron deficiency anemia)   . Moderate tobacco use disorder   . NICM (nonischemic cardiomyopathy) (HCC)   . Nicotine addiction   . Tobacco abuse     Medications:  Medications Prior to Admission  Medication Sig Dispense Refill Last Dose  . albuterol (PROVENTIL) (2.5 MG/3ML) 0.083% nebulizer solution Take 3 mLs (2.5 mg total) by nebulization every 4 (four) hours as needed for wheezing or shortness of breath. 75 mL 0 11/11/2020 at Unknown time  . albuterol (VENTOLIN HFA) 108 (90 Base) MCG/ACT inhaler INHALE 2 PUFFS INTO THE LUNGS EVERY 6 (SIX) HOURS AS NEEDED FOR WHEEZING OR SHORTNESS OF BREATH. 18 g 5 11/11/2020 at Unknown time  . atorvastatin (LIPITOR) 80 MG tablet TAKE 1 TABLET EVERY DAY 90 tablet 1 11/11/2020 at Unknown time  . Budeson-Glycopyrrol-Formoterol 160-9-4.8 MCG/ACT AERO INHALE 2 PUFFS INTO THE LUNGS 2 (TWO) TIMES DAILY. 10.7 g 11 11/11/2020 at Unknown time  . carvedilol (COREG) 3.125 MG tablet TAKE 1 TABLET (3.125 MG TOTAL) BY MOUTH 2 (TWO) TIMES DAILY. (Patient taking differently: Take 3.125 mg by mouth 2 (two) times daily.) 180 tablet 1 11/11/2020 at Unknown time  . furosemide (LASIX) 40 MG tablet TAKE 1 TABLET EVERY DAY (Patient taking  differently: Take 40 mg by mouth daily.) 90 tablet 1 11/11/2020 at am  . gabapentin (NEURONTIN) 300 MG capsule TAKE 1 CAPSULE (300 MG TOTAL) BY MOUTH AT BEDTIME. (Patient taking differently: Take 300 mg by mouth at bedtime.) 30 capsule 6 11/10/2020 at hs  . Misc. Devices MISC Portable oxygen concentrator. Diagnosis COPD. (Patient taking differently: Inhale 3 L into the lungs. Portable oxygen concentrator. Diagnosis COPD.) 1 each 0 11/11/2020 at Unknown time  . nitroGLYCERIN (NITROSTAT) 0.4 MG SL tablet PLACE 1 TABLET (0.4 MG TOTAL) UNDER THE TONGUE EVERY 5 (FIVE) MINUTES AS NEEDED FOR CHEST PAIN. CALL 911 IF SYMPTOMS DO NOT RESOLVE AFTER 3 MINUTES (Patient taking differently:  Place 0.4 mg under the tongue every 5 (five) minutes as needed for chest pain.) 30 tablet 1   . XARELTO 20 MG TABS tablet Take 20 mg by mouth daily.   11/10/2020 at hs    Assessment: Xarelto 20 qday PTA for PAF, LD 6/2 at HS CC: hematemesis EGD 6/4  UGI bleed ( hematemesis / blood in stool)  / secondary to PUD. Small bowel endoscopy 6/4 >> Gastritis. Two small non-bleeding gastric ulcer with a non-bleeding visible vessel ( treated) and another gastric ulcer oozing from edges.  No gross lesions in esophagus. Salmon-colored mucosa noted in distal esohagus - not biopsied due to concern for possible underlying varices.  6/7 startHeparin gtt and monitor for bleeding prior to transitioning to Xarelto.   11/15/2020 Hg 7.4. low but stable, transfused 6/5\ PLT 126. Low PPI drip to end today and start PPI 40 IV q12 this AM  Goal of Therapy:  Heparin level 0.3-0.7 units/ml Monitor platelets by anticoagulation protocol: Yes   Plan:  No bolus Start heparin drip at 1450 units/hr and check 8 hr heparin level Daily heparin level & CBC F/u plans resume PTA xarelto 20 qday if no bleeding on heparin drip  Herby Abraham, Pharm.D 11/15/2020 8:56 AM

## 2020-11-15 NOTE — Discharge Summary (Signed)
Physician Discharge Summary  Patterson HAL:[id number] DOB: 01/26/1956 DOA: 11/11/2020  PCP: [name], MD  Admit date: 11/11/2020 Discharge date: 11/15/2020  Admitted From: home Disposition:  home  Recommendations for Outpatient Follow-up:  Follow up with PCP in 1-2 weeks Please obtain BMP/CBC in one week Please follow up with gastroenterology as scheduled Follow up at [facility] with Dr. Ranell Patrick (or any of their shoulder surgeons) for left rotator cuff partial tear. Please refer patient to outpatient OT for Left Shoulder RC tear. Please consider referral to hematology/oncology regarding MRI findings (see report below)    Home Health: none  Equipment/Devices: none  Discharge Condition: stable  CODE STATUS: full Diet recommendation: Heart Healthy     Discharge Diagnoses: Principal Problem:   Acute upper GI bleed Active Problems:   Essential hypertension   CAD (coronary artery disease)   COPD GOLD III/still smoking    Chronic combined systolic and diastolic CHF (congestive heart failure) (HCC)   Atrial flutter (HCC)   Chronic respiratory failure with hypoxia and hypercapnia (HCC)   Hematemesis with nausea   Acute gastric ulcer with hemorrhage   Acute blood loss anemia    Summary of HPI and Hospital Course:  Per H&P by Dr. Minerva Areola: "Jared [name] is a 65 y.o. male with medical history significant for COPD, chronic hypoxic and hypercarbic respiratory failure, chronic combined systolic and diastolic CHF, paroxysmal atrial fibrillation on Xarelto, history of CVA, and history of GI bleeding, now presenting to the emergency department for evaluation of abdominal discomfort with dark red vomit.  Patient reports that he been in his usual state until yesterday evening when he developed some abdominal discomfort, nausea, and vomited what appeared to be dark red and maroon liquid.  He last took Xarelto the evening of 11/10/2020.  He does not have any abdominal pain in the emergency  department.  He has not had any further episodes of vomiting.  He had not noted any melena or hematochezia.  He was admitted for symptomatic anemia just over a month ago and had small bowel enteroscopy with normal esophagus, stomach, duodenum, and visualized portions of jejunum.   ED Course: Upon arrival to the ED, patient is found to be afebrile, saturating well on his usual 3 L/min of supplemental oxygen, slightly tachypneic and tachycardic, and with blood pressure 92/60.  Chemistry panel is notable for BUN of 37, up from 13 late last month.  CBC features a leukocytosis to 12,900 and hemoglobin 8.7.  Gastroenterology was consulted by the ED physician and the patient was treated with IV Protonix and erythromycin.  He went on to have a loose stool that appeared bloody per nursing report."    DISCHARGE PLAN: Protonix BID H. Pylori treatment x 14 days - Flagyl, Doxycycline, bismuth ... and PPI Follow up with GI as scheduled  Left Shoulder Partial Thickness Rotator Cuff Tear - Follow up with Dr. Ranell Patrick at [facility] - call to make appointment.  Primary care to order outpatient therapy if patient wants to go.  Coreg held at d/c due to soft BP's    Acute Upper GI Bleed due to Gastric Ulcer Acute on Chronic iron deficiency anemia due to above Small bowel endoscopy 6/4 >> Gastritis. Two small non-bleeding gastric ulcer with a non-bleeding visible vessel ( treated) and another gastric ulcer oozing from edges.  No gross lesions in esophagus. Salmon-colored mucosa noted in distal esohagus - not biopsied due to concern for possible underlying varices. --GI consulted - follow up as scheduled --Trend  Hbg, transfuse if < 7.0 --GI may repeat EGD in 3 months --Absolutely no NSAID use --Xarelto held, resumed prior to d/c and pt stable w/o bleeding.   --Started on oral iron supplement BID     Hyperkalemia -resolved.  K5.4 on labs 6/5.  Will get a repeat level with next H&H and if still high give  Lokelma.   Chronic combined systolic/diastolic CHF -appears euvolemic and well compensated.  Lasix, Coreg held on admission with gentle IV hydration given in setting of acute GI bleeding and n.p.o. status. -- Lasix resumed --Coreg still on hold due to soft BP's -- Monitor BP with Lasix before resuming Coreg   COPD with chronic respiratory failure with hypoxia and hypercapnia -stable on admission.  Post EGD, patient slow to awaken and found with PCO2 above 100.  Required BiPAP for short time.  Resolved. -- Continue ICS, LABA, LAMA and as needed albuterol -- Supplemental oxygen to maintain O2 sat 88 to 93% -- Monitor clinically   Paroxysmal A. Fib -CHA2DS2-VASc score is at least 5 (age, CVA x2, CAD, CHF).  Xarelto held on admission due to active GI bleeding.  Anticoagulation will be held 72 hours, per GI. Resume with heparin drip and monitor for bleeding prior to transition to Xarelto.   CAD -stable, no active chest pain.  Continue statin   History of CVA -continue statin.  Xarelto on hold as above.   Tobacco abuse -nicotine patch ordered.  Patient counseled on importance of cessation for his overall health.       Discharge Instructions   Discharge Instructions     (HEART FAILURE PATIENTS) Call MD:  Anytime you have any of the following symptoms: 1) 3 pound weight gain in 24 hours or 5 pounds in 1 week 2) shortness of breath, with or without a dry hacking cough 3) swelling in the hands, feet or stomach 4) if you have to sleep on extra pillows at night in order to breathe.   Complete by: As directed    Call MD for:   Complete by: As directed    Signs of bleeding (bloody stools) or worsening anemia symptoms (shortness of breath with exertion, dizzy, lightheaded, generally weak / fatigued.   Call MD for:  extreme fatigue   Complete by: As directed    Call MD for:  persistant dizziness or light-headedness   Complete by: As directed    Call MD for:  persistant nausea and vomiting    Complete by: As directed    Call MD for:  severe uncontrolled pain   Complete by: As directed    Call MD for:  temperature >100.4   Complete by: As directed    Diet - low sodium heart healthy   Complete by: As directed    Discharge instructions   Complete by: As directed    STOMACH ULCER & BLEEDING ---  If you see signs of bleeding again (stools with red, maroon), stop taking your Xarelto and call your doctor. We started oral iron supplements - be aware these can make your stool look dark, and can cause constipation.  You may need to take stool softeners.  LEFT SHOULDER -   MRI showed a partial rotator cuff tear.  I contacted [facility] and have included their contact information for Dr. Ranell Patrick, one of the shoulder surgeons.  If you feel like trying therapy (it will help, highly recommend!!), have your primary care doctor order that for you.  Okay to take Tylenol (not more than  3000 mg, max 4000 mg in a day), but DO NOT take Aleve or ibuprofen or any other NSAID's (those can cause ulcers).  BLOOD PRESSURE / HEART -  We had to stop your carvedilol (aka Coreg) at time of discharge because your BP has been a little on the low side while we've been holding it here.  Please monitor your BP at home if possible.  Make an appointment with cardiology and/or primary care about when it is okay to start taking carvedilol again.   Increase activity slowly   Complete by: As directed       Allergies as of 11/15/2020       Reactions   Lisinopril Cough        Medication List     STOP taking these medications    carvedilol 3.125 MG tablet Commonly known as: COREG       TAKE these medications    albuterol 108 (90 Base) MCG/ACT inhaler Commonly known as: VENTOLIN HFA INHALE 2 PUFFS INTO THE LUNGS EVERY 6 (SIX) HOURS AS NEEDED FOR WHEEZING OR SHORTNESS OF BREATH.   albuterol (2.5 MG/3ML) 0.083% nebulizer solution Commonly known as: PROVENTIL Take 3 mLs (2.5 mg total) by nebulization  every 4 (four) hours as needed for wheezing or shortness of breath.   atorvastatin 80 MG tablet Commonly known as: LIPITOR TAKE 1 TABLET EVERY DAY   bismuth subsalicylate 262 MG chewable tablet Commonly known as: PEPTO BISMOL Chew 2 tablets (524 mg total) by mouth 4 (four) times daily.   Breztri Aerosphere 160-9-4.8 MCG/ACT Aero Generic drug: Budeson-Glycopyrrol-Formoterol INHALE 2 PUFFS INTO THE LUNGS 2 (TWO) TIMES DAILY.   doxycycline 100 MG tablet Commonly known as: VIBRA-TABS Take 1 tablet (100 mg total) by mouth every 12 (twelve) hours for 14 days.   ferrous sulfate 325 (65 FE) MG tablet Take 1 tablet (325 mg total) by mouth 2 (two) times daily with a meal. Start taking on: November 16, 2020   furosemide 40 MG tablet Commonly known as: LASIX TAKE 1 TABLET EVERY DAY   gabapentin 300 MG capsule Commonly known as: NEURONTIN TAKE 1 CAPSULE (300 MG TOTAL) BY MOUTH AT BEDTIME. What changed: how much to take   metroNIDAZOLE 250 MG tablet Commonly known as: FLAGYL Take 1 tablet (250 mg total) by mouth every 12 (twelve) hours for 14 days.   Misc. Devices Misc Portable oxygen concentrator. Diagnosis COPD. What changed:  how much to take how to take this   multivitamin with minerals Tabs tablet Take 1 tablet by mouth daily. Start taking on: November 16, 2020   nitroGLYCERIN 0.4 MG SL tablet Commonly known as: NITROSTAT PLACE 1 TABLET (0.4 MG TOTAL) UNDER THE TONGUE EVERY 5 (FIVE) MINUTES AS NEEDED FOR CHEST PAIN. CALL 911 IF SYMPTOMS DO NOT RESOLVE AFTER 3 MINUTES What changed:  how much to take how to take this when to take this reasons to take this   pantoprazole 40 MG tablet Commonly known as: Protonix Take 1 tablet (40 mg total) by mouth 2 (two) times daily.   Xarelto 20 MG Tabs tablet Generic drug: rivaroxaban Take 20 mg by mouth daily.        Follow-up Information     [name], MD. Schedule an appointment as soon as possible for a visit in 1 week(s).    Specialty: Family Medicine Why: Hospital Follow-up  Contact information: [address] [place] [address] [phone]         Pricilla Riffle, MD .  Specialty: Cardiology Contact information: [address] [place] [address] [phone]         Meredith Pel, NP Follow up on 12/21/2020.   Specialty: Gastroenterology Why: at 9:00am. Please arrive 10 minutes early Contact information: [address] [place] [address] [phone]         Beverely Low, MD. Call.   Specialty: Orthopedic Surgery Why: Call to request appointment for evaluation of Left Shoulder pain and weakness. Contact information: [address] [place] [address] [phone]                Allergies  Allergen Reactions   Lisinopril Cough     If you experience worsening of your admission symptoms, develop shortness of breath, life threatening emergency, suicidal or homicidal thoughts you must seek medical attention immediately by calling 911 or calling your MD immediately  if symptoms less severe.    Please note   You were cared for by a hospitalist during your hospital stay. If you have any questions about your discharge medications or the care you received while you were in the hospital after you are discharged, you can call the unit and asked to speak with the hospitalist on call if the hospitalist that took care of you is not available. Once you are discharged, your primary care physician will handle any further medical issues. Please note that NO REFILLS for any discharge medications will be authorized once you are discharged, as it is imperative that you return to your primary care physician (or establish a relationship with a primary care physician if you do not have one) for your aftercare needs so that they can reassess your need for medications and monitor your lab  values.   Consultations: Gastroenterology   Procedures/Studies: DG Chest 2 View  Result Date: 11/01/2020 CLINICAL DATA:  Shortness of breath. EXAM: CHEST - 2 VIEW COMPARISON:  CT 01/18/2020.  Chest x-ray 06/11/2019. FINDINGS: Mediastinum and hilar structures normal. Cardiomegaly with mild pulmonary venous congestion. Mild interstitial prominence suggesting mild interstitial edema. No pleural effusion or pneumothorax. Left costophrenic angle incompletely imaged. No pneumothorax. IMPRESSION: Cardiomegaly mild pulmonary venous congestion. Mild interstitial prominence suggesting mild interstitial edema. Electronically Signed   By: Maisie Fus  Register   On: 11/01/2020 10:07   MR SHOULDER LEFT W WO CONTRAST  Result Date: 11/15/2020 CLINICAL DATA:  Left shoulder pain and weakness EXAM: MRI OF THE LEFT SHOULDER WITHOUT AND WITH CONTRAST TECHNIQUE: Multiplanar, multisequence MR imaging of the left shoulder was performed before and after the administration of intravenous contrast. CONTRAST:  22mL GADAVIST GADOBUTROL 1 MMOL/ML IV SOLN COMPARISON:  None. FINDINGS: Technical note: Despite efforts by the technologist and patient, motion artifact is present on today's exam and could not be eliminated. This reduces exam sensitivity and specificity. Rotator cuff: Partial thickness articular sided tearing of the distal supraspinatus and infraspinatus tendons involving up to 50% of the tendon depth. The torn tendon fibers are retracted to the level of the humeral head apex. In total, the tear measures approximately 18 mm in AP dimension. No appreciable full-thickness component. Subscapularis and teres minor tendons intact. Muscles: Preserved bulk and signal intensity of the rotator cuff musculature without edema, atrophy, or fatty infiltration. Biceps long head:  Intact. Acromioclavicular Joint: Mild arthropathy of the AC joint. Os acromiale. Small volume subacromial-subdeltoid bursal fluid. Glenohumeral Joint: No cartilage  defect.  No joint effusion. Labrum: Grossly intact, but evaluation is limited by lack of intraarticular fluid. Bones: There are  extensive areas of confluent low T1 and T2 bone marrow signal including within the glenoid, coracoid process, scapula, and visualized mid to distal clavicle. No associated bone marrow edema. No enhancement within these areas on postcontrast imaging. No pathologic fracture. No malalignment. Other: No soft tissue edema or fluid collections. No abnormal postcontrast enhancement. IMPRESSION: 1. Partial-thickness articular-sided tearing of the distal supraspinatus and infraspinatus tendons involving up to 50% of the tendon depth. The torn tendon fibers are retracted to the level of the humeral head apex. No full-thickness rotator cuff tear. 2. Mild AC joint arthropathy with os acromiale. 3. Mild subacromial-subdeltoid bursitis. 4. Extensive areas of confluent low T1 and T2 bone marrow signal including within the glenoid, coracoid process, scapula, and visualized mid to distal clavicle. No associated bone marrow edema or enhancement. Findings are nonspecific and could be secondary to previous blood transfusions. Other hematologic abnormalities, lymphoproliferative process, or metastatic disease could have a similar appearance and are not excluded. Hematology-oncology evaluation is recommended. Electronically Signed   By: Duanne Guess D.O.   On: 11/15/2020 15:51   US LIVER DOPPLER  Result Date: 11/14/2020 CLINICAL DATA:  Elevated LFTs EXAM: DUPLEX ULTRASOUND OF LIVER TECHNIQUE: Color and duplex Doppler ultrasound was performed to evaluate the hepatic in-flow and out-flow vessels. COMPARISON:  None. FINDINGS: Liver: Mildly heterogeneous and coarsened parenchymal echogenicity. Normal hepatic contour without nodularity. No focal lesion, mass or intrahepatic biliary ductal dilatation. Main Portal Vein size: 0.9 cm Portal Vein Velocities Main Prox:  55 cm/sec with normal hepatopetal directional  flow. Main Mid: 48 cm/sec with normal hepatopetal directional flow. Main Dist:  48 cm/sec with normal hepatopetal directional flow. Right: 33 cm/sec with normal hepatopetal directional flow. Left: 33 cm/sec with normal hepatopetal directional flow. Hepatic Vein Velocities Right:  52 cm/sec Middle:  85 cm/sec Left:  78 cm/sec IVC: Present and patent with normal respiratory phasicity. Hepatic Artery Velocity:  222 cm/sec Splenic Vein Velocity:  52 cm/sec Spleen: 6.3 cm x 4.1 cm x 8.2 cm with a total volume of 111 cm^3 (411 cm^3 is upper limit normal) Portal Vein Occlusion/Thrombus: No Splenic Vein Occlusion/Thrombus: No Ascites: None Varices: None IMPRESSION: 1. Mildly heterogeneous and coarsened echogenicity of the hepatic parenchyma. Differential considerations include mild hepatic steatosis versus intrinsic liver disease/fibrosis. 2. Patent portal and hepatic veins with normal directional flow. Electronically Signed   By: Malachy Moan M.D.   On: 11/14/2020 08:06      Subjective: Pt reports left shoulder pain and weakness without any injury or trauma.  Denies any recurrent bleeding.  No other acute complaints.    Discharge Exam: Vitals:   11/15/20 1607 11/15/20 1613  BP: 138/75   Pulse: (!) 102   Resp: 20   Temp:  98.3 F (36.8 C)  SpO2: 95%    Vitals:   11/15/20 1145 11/15/20 1200 11/15/20 1607 11/15/20 1613  BP:  (!) 97/43 138/75   Pulse:   (!) 102   Resp:  19 20   Temp: 98.1 F (36.7 C)   98.3 F (36.8 C)  TempSrc: Oral   Oral  SpO2:  96% 95%   Weight:      Height:        General: Pt is alert, awake, not in acute distress Cardiovascular: RRR, S1/S2 +, no rubs, no gallops Respiratory: CTA bilaterally, no wheezing, no rhonchi Abdominal: Soft, NT, ND, bowel sounds + Extremities: 5/5 bl UE motor symmetric with elbow flexion and extension, weakness and pain with active shoulder flexion and abduction, passive  ROM of same limited by pain    The results of significant  diagnostics from this hospitalization (including imaging, microbiology, ancillary and laboratory) are listed below for reference.     Microbiology: Recent Results (from the past 240 hour(s))  Resp Panel by RT-PCR (Flu A&B, Covid) Nasopharyngeal Swab     Status: None   Collection Time: 11/11/20 11:06 PM   Specimen: Nasopharyngeal Swab; Nasopharyngeal(NP) swabs in vial transport medium  Result Value Ref Range Status   SARS Coronavirus 2 by RT PCR NEGATIVE NEGATIVE Final    Comment: (NOTE) SARS-CoV-2 target nucleic acids are NOT DETECTED.  The SARS-CoV-2 RNA is generally detectable in upper respiratory specimens during the acute phase of infection. The lowest concentration of SARS-CoV-2 viral copies this assay can detect is 138 copies/mL. A negative result does not preclude SARS-Cov-2 infection and should not be used as the sole basis for treatment or other patient management decisions. A negative result may occur with  improper specimen collection/handling, submission of specimen other than nasopharyngeal swab, presence of viral mutation(s) within the areas targeted by this assay, and inadequate number of viral copies(<138 copies/mL). A negative result must be combined with clinical observations, patient history, and epidemiological information. The expected result is Negative.  Fact Sheet for Patients:  BloggerCourse.com  Fact Sheet for Healthcare Providers:  SeriousBroker.it  This test is no t yet approved or cleared by the [place] FDA and  has been authorized for detection and/or diagnosis of SARS-CoV-2 by FDA under an Emergency Use Authorization (EUA). This EUA will remain  in effect (meaning this test can be used) for the duration of the COVID-19 declaration under Section 564(b)(1) of the Act, 21 U.S.C.section 360bbb-3(b)(1), unless the authorization is terminated  or revoked sooner.       Influenza A by PCR NEGATIVE  NEGATIVE Final   Influenza B by PCR NEGATIVE NEGATIVE Final    Comment: (NOTE) The Xpert Xpress SARS-CoV-2/FLU/RSV plus assay is intended as an aid in the diagnosis of influenza from Nasopharyngeal swab specimens and should not be used as a sole basis for treatment. Nasal washings and aspirates are unacceptable for Xpert Xpress SARS-CoV-2/FLU/RSV testing.  Fact Sheet for Patients: BloggerCourse.com  Fact Sheet for Healthcare Providers: SeriousBroker.it  This test is not yet approved or cleared by the [place] FDA and has been authorized for detection and/or diagnosis of SARS-CoV-2 by FDA under an Emergency Use Authorization (EUA). This EUA will remain in effect (meaning this test can be used) for the duration of the COVID-19 declaration under Section 564(b)(1) of the Act, 21 U.S.C. section 360bbb-3(b)(1), unless the authorization is terminated or revoked.  Performed at [facility], [address] [place], [place] [address]   MRSA PCR Screening     Status: None   Collection Time: 11/12/20  1:46 AM   Specimen: Nasal Mucosa; Nasopharyngeal  Result Value Ref Range Status   MRSA by PCR NEGATIVE NEGATIVE Final    Comment:        The GeneXpert MRSA Assay (FDA approved for NASAL specimens only), is one component of a comprehensive MRSA colonization surveillance program. It is not intended to diagnose MRSA infection nor to guide or monitor treatment for MRSA infections. Performed at [facility], [address], [place], [place] [address]      Labs: BNP (last 3 results) Recent Labs    11/01/20 1012  BNP 700.5*   Basic Metabolic Panel: Recent Labs  Lab 11/11/20 2151 11/12/20 0359 11/13/20 0202  11/13/20 1324 11/14/20 0419 11/15/20 0246  NA 143 143 140  --  138 140  K 4.7 4.5 5.4* 4.4 4.2 3.5  CL 100 102 100  --  97* 98  CO2 36* 38* 36*  --  37* 36*  GLUCOSE 154* 135*  153*  --  110* 147*  BUN 37* 50* 30*  --  17 10  CREATININE 0.99 0.98 1.08  --  1.00 1.00  CALCIUM 8.0* 8.0* 8.2*  --  8.2* 8.2*  MG  --  2.1  --   --   --   --    Liver Function Tests: Recent Labs  Lab 11/11/20 2151  AST 40  ALT 42  ALKPHOS 50  BILITOT 0.7  PROT 4.8*  ALBUMIN 2.8*   No results for input(s): LIPASE, AMYLASE in the last 168 hours. No results for input(s): AMMONIA in the last 168 hours. CBC: Recent Labs  Lab 11/11/20 2151 11/12/20 0359 11/14/20 0419 11/14/20 1124 11/14/20 1724 11/14/20 2303 11/15/20 0246 11/15/20 1306  WBC 12.9*  --  11.4*  --   --   --  10.5  --   HGB 8.7*   < > 7.4* 7.3* 7.3* 7.3* 7.4* 7.7*  HCT 33.9*   < > 28.2* 27.4* 27.3* 27.3* 27.6* 28.7*  MCV 89.9  --  91.3  --   --   --  89.6  --   PLT 184  --  130*  --   --   --  126*  --    < > = values in this interval not displayed.   Cardiac Enzymes: No results for input(s): CKTOTAL, CKMB, CKMBINDEX, TROPONINI in the last 168 hours. BNP: Invalid input(s): POCBNP CBG: No results for input(s): GLUCAP in the last 168 hours. D-Dimer No results for input(s): DDIMER in the last 72 hours. Hgb A1c No results for input(s): HGBA1C in the last 72 hours. Lipid Profile No results for input(s): CHOL, HDL, LDLCALC, TRIG, CHOLHDL, LDLDIRECT in the last 72 hours. Thyroid function studies No results for input(s): TSH, T4TOTAL, T3FREE, THYROIDAB in the last 72 hours.  Invalid input(s): FREET3 Anemia work up No results for input(s): VITAMINB12, FOLATE, FERRITIN, TIBC, IRON, RETICCTPCT in the last 72 hours. Urinalysis    Component Value Date/Time   COLORURINE YELLOW 07/25/2016 1845   APPEARANCEUR HAZY (A) 07/25/2016 1845   LABSPEC 1.017 07/25/2016 1845   PHURINE 5.0 07/25/2016 1845   GLUCOSEU 50 (A) 07/25/2016 1845   HGBUR SMALL (A) 07/25/2016 1845   BILIRUBINUR NEGATIVE 07/25/2016 1845   KETONESUR NEGATIVE 07/25/2016 1845   PROTEINUR 100 (A) 07/25/2016 1845   NITRITE NEGATIVE 07/25/2016 1845    LEUKOCYTESUR NEGATIVE 07/25/2016 1845   Sepsis Labs Invalid input(s): PROCALCITONIN,  WBC,  LACTICIDVEN Microbiology Recent Results (from the past 240 hour(s))  Resp Panel by RT-PCR (Flu A&B, Covid) Nasopharyngeal Swab     Status: None   Collection Time: 11/11/20 11:06 PM   Specimen: Nasopharyngeal Swab; Nasopharyngeal(NP) swabs in vial transport medium  Result Value Ref Range Status   SARS Coronavirus 2 by RT PCR NEGATIVE NEGATIVE Final    Comment: (NOTE) SARS-CoV-2 target nucleic acids are NOT DETECTED.  The SARS-CoV-2 RNA is generally detectable in upper respiratory specimens during the acute phase of infection. The lowest concentration of SARS-CoV-2 viral copies this assay can detect is 138 copies/mL. A negative result does not preclude SARS-Cov-2 infection and should not be used as the sole basis for treatment or other patient management decisions. A negative  result may occur with  improper specimen collection/handling, submission of specimen other than nasopharyngeal swab, presence of viral mutation(s) within the areas targeted by this assay, and inadequate number of viral copies(<138 copies/mL). A negative result must be combined with clinical observations, patient history, and epidemiological information. The expected result is Negative.  Fact Sheet for Patients:  BloggerCourse.com  Fact Sheet for Healthcare Providers:  SeriousBroker.it  This test is no t yet approved or cleared by the [place] FDA and  has been authorized for detection and/or diagnosis of SARS-CoV-2 by FDA under an Emergency Use Authorization (EUA). This EUA will remain  in effect (meaning this test can be used) for the duration of the COVID-19 declaration under Section 564(b)(1) of the Act, 21 U.S.C.section 360bbb-3(b)(1), unless the authorization is terminated  or revoked sooner.       Influenza A by PCR NEGATIVE NEGATIVE Final   Influenza  B by PCR NEGATIVE NEGATIVE Final    Comment: (NOTE) The Xpert Xpress SARS-CoV-2/FLU/RSV plus assay is intended as an aid in the diagnosis of influenza from Nasopharyngeal swab specimens and should not be used as a sole basis for treatment. Nasal washings and aspirates are unacceptable for Xpert Xpress SARS-CoV-2/FLU/RSV testing.  Fact Sheet for Patients: BloggerCourse.com  Fact Sheet for Healthcare Providers: SeriousBroker.it  This test is not yet approved or cleared by the [place] FDA and has been authorized for detection and/or diagnosis of SARS-CoV-2 by FDA under an Emergency Use Authorization (EUA). This EUA will remain in effect (meaning this test can be used) for the duration of the COVID-19 declaration under Section 564(b)(1) of the Act, 21 U.S.C. section 360bbb-3(b)(1), unless the authorization is terminated or revoked.  Performed at [facility], [address] [place], [place] [address]   MRSA PCR Screening     Status: None   Collection Time: 11/12/20  1:46 AM   Specimen: Nasal Mucosa; Nasopharyngeal  Result Value Ref Range Status   MRSA by PCR NEGATIVE NEGATIVE Final    Comment:        The GeneXpert MRSA Assay (FDA approved for NASAL specimens only), is one component of a comprehensive MRSA colonization surveillance program. It is not intended to diagnose MRSA infection nor to guide or monitor treatment for MRSA infections. Performed at [facility], [address], [place], [place] [address]      Time coordinating discharge: Over 30 minutes  SIGNED:   Pennie Banter, DO [facility] 11/15/2020, 4:32 PM   If 7PM-7AM, please contact night-coverage [email or web address]

## 2020-11-15 NOTE — Evaluation (Signed)
Occupational Therapy Evaluation Patient Details Name: Jared [name] MRN: [id number] DOB: 1955/07/08 Today's Date: 11/15/2020    History of Present Illness Patient is a 64 year old male admitted 6/3 with abdominal discomfort with dark red vomit. s/p Small bowel enteroscopy. OT consulted due to acute onset of L shoulder pain limiting UE functioning, pt reports since 6/4. MRI ordered 6/7. PMH includes COPD, chronic hypoxic and hypercarbic respiratory failure, chronic combined systolic and diastolic CHF, paroxysmal atrial fibrillation on Xarelto, history of CVA, and history of GI bleeding   Clinical Impression   Patient lives home alone in a single level house with 5 steps to enter. Reports fully independent at baseline, does not use any adaptive devices. Per patient no deficits from previous CVA "I didn't even know I had one that's what the MRI said." Patient reports recent onset of L shoulder pain "since this Saturday when I got here." Denies any recollection of somehow injuring shoulder, sleeping on it, ect "it just started." Scapular elevation and retraction appear intact however patient does report soreness and tightness in middle of back. Distal range of motion at elbow, wrist, hand grossly intact. Patient unable to initiate AROM at L shoulder "I can't" with passive range of motion patient grimace at ~90 degrees shoulder flexion and ~60 degrees of shoulder abduction. MRI has been ordered but not performed yet. Will continue to follow acutely for ADL retraining and would recommend outpatient follow up for further intervention.    Follow Up Recommendations  Outpatient OT    Equipment Recommendations  None recommended by OT       Precautions / Restrictions Restrictions Weight Bearing Restrictions: No      Mobility Bed Mobility Overal bed mobility: Modified Independent             General bed mobility comments: head of bed elevated and use of bed rail    Transfers Overall transfer  level: Needs assistance Equipment used: None Transfers: Stand Pivot Transfers;Sit to/from Stand Sit to Stand: Supervision Stand pivot transfers: Supervision       General transfer comment: no physical assistance needed, supervision for safety/line management    Balance Overall balance assessment: No apparent balance deficits (not formally assessed)                                         ADL either performed or assessed with clinical judgement   ADL Overall ADL's : Needs assistance/impaired Eating/Feeding: Independent;Sitting   Grooming: Set up;Sitting   Upper Body Bathing: Minimal assistance;Sitting   Lower Body Bathing: Supervison/ safety;Sit to/from stand   Upper Body Dressing : Minimal assistance;Sitting Upper Body Dressing Details (indicate cue type and reason): due to L UE limited ROM/pain Lower Body Dressing: Supervision/safety;Sitting/lateral leans;Sit to/from stand Lower Body Dressing Details (indicate cue type and reason): patient able to pull up socks in sitting Toilet Transfer: Supervision/safety;Stand-pivot Toilet Transfer Details (indicate cue type and reason): for safety/line management did not require physical assistance to recliner, patient winces in sitting due to shoulder pain Toileting- Clothing Manipulation and Hygiene: Supervision/safety;Sitting/lateral lean;Sit to/from stand       Functional mobility during ADLs: Supervision/safety                    Pertinent Vitals/Pain Pain Assessment: No/denies pain     Hand Dominance Right   Extremity/Trunk Assessment Upper Extremity Assessment Upper Extremity Assessment: LUE deficits/detail;RUE deficits/detail RUE Deficits /  Details: R UE grossly intact LUE Deficits / Details: shoulder elevation and scapular retraction grossly intact compared to R however patient does report a soreness in middle of his back with L scapular retraction. distal AROM elbow, wrist, hand grossly intact  with grip slightly weaker than dominant R hand. patient unable to actively mobilize L shoulder, with PROM able to flex to ~90 degrees and abduction ~60 degrees before patient facial grimace and reporting pain LUE: Unable to fully assess due to pain   Lower Extremity Assessment Lower Extremity Assessment: Overall WFL for tasks assessed   Cervical / Trunk Assessment Cervical / Trunk Assessment: Normal   Communication Communication Communication: No difficulties   Cognition Arousal/Alertness: Awake/alert Behavior During Therapy: WFL for tasks assessed/performed Overall Cognitive Status: Within Functional Limits for tasks assessed                                                Home Living Family/patient expects to be discharged to:: Private residence Living Arrangements: Alone   Type of Home: House Home Access: Stairs to enter Secretary/administrator of Steps: 5 Entrance Stairs-Rails: Right;Left Home Layout: One level     Bathroom Shower/Tub: Chief Strategy Officer: Standard     Home Equipment: None   Additional Comments: O2 intermitent      Prior Functioning/Environment Level of Independence: Independent                 OT Problem List: Decreased strength;Decreased range of motion;Pain;Impaired UE functional use      OT Treatment/Interventions: Self-care/ADL training;Therapeutic exercise;Patient/family education;Therapeutic activities    OT Goals(Current goals can be found in the care plan section) Acute Rehab OT Goals Patient Stated Goal: less pain OT Goal Formulation: With patient Time For Goal Achievement: 11/29/20 Potential to Achieve Goals: Good  OT Frequency: Min 2X/week    AM-PAC OT "6 Clicks" Daily Activity     Outcome Measure Help from another person eating meals?: None Help from another person taking care of personal grooming?: A Little Help from another person toileting, which includes using toliet, bedpan, or  urinal?: A Little Help from another person bathing (including washing, rinsing, drying)?: A Little Help from another person to put on and taking off regular upper body clothing?: A Little Help from another person to put on and taking off regular lower body clothing?: A Little 6 Click Score: 19   End of Session Nurse Communication: Mobility status  Activity Tolerance: Patient tolerated treatment well Patient left: in chair;with call bell/phone within reach  OT Visit Diagnosis: Pain Pain - Right/Left: Left Pain - part of body: Shoulder                Time: 1310-1340 OT Time Calculation (min): 30 min Charges:  OT General Charges $OT Visit: 1 Visit OT Evaluation $OT Eval Low Complexity: 1 Low OT Treatments $Self Care/Home Management : 8-22 mins  Jared [name] OT OT pager: [phone]  Jared [name] 11/15/2020, 2:02 PM

## 2020-11-16 ENCOUNTER — Other Ambulatory Visit: Payer: Self-pay

## 2020-11-16 ENCOUNTER — Telehealth: Payer: Self-pay

## 2020-11-16 LAB — HEMOGLOBIN A1C
Hgb A1c MFr Bld: 5.7 % — ABNORMAL HIGH (ref 4.8–5.6)
Mean Plasma Glucose: 117 mg/dL

## 2020-11-16 NOTE — Telephone Encounter (Signed)
Transition Care Management Follow-up Telephone Call  Date of discharge and from where: [facility] on 11/15/2020 How have you been since you were released from the hospital? Feeling better  Any questions or concerns? No questions/concerns reported.  Items Reviewed: Did the pt receive and understand the discharge instructions provided? Pt stated tat have the instructions and have no questions.  Medications obtained and verified? He said that he will pick meds today and  have the medication list.   Any new allergies since your discharge? None reported  Do you have support at home? NO Other (ie: DME, Home Health, etc)     He already has a nebulizer but said that he needs a new mouthpiece/ facemask.  Explained to him that he can pick up a new mouthpiece at [facility] if he is not able to get one from his DME company.  He has O2 that he only uses when needed.  He has the home fill system, not a POC.    Functional Questionnaire: (I = Independent and D = Dependent) ADL's:  Independent.      Follow up appointments reviewed:   PCP Hospital f/u appt confirmed? Dr Alvis Lemmings 12/05/2020@ 1050.  Specialist Hospital f/u appt confirmed?  scheduled at this time  Are transportation arrangements needed? have transportation   If their condition worsens, is the pt aware to call  their PCP or go to the ED? Yes.Made pt aware if condition worsen or start experiencing rapid weight gain, chest pain, diff breathing, SOB, high fevers, or bleading to refer imediately to ED for further evaluation.  Was the patient provided with contact information for the PCP's office or ED? He has the phone number  Was the pt encouraged to call back with questions or concerns?yes

## 2020-11-17 ENCOUNTER — Encounter (HOSPITAL_COMMUNITY): Payer: Self-pay

## 2020-11-17 ENCOUNTER — Observation Stay (HOSPITAL_COMMUNITY)
Admission: EM | Admit: 2020-11-17 | Discharge: 2020-11-19 | Disposition: A | Discharge status: Home / Self Care | Payer: Medicare HMO | Attending: Internal Medicine | Admitting: Internal Medicine

## 2020-11-17 ENCOUNTER — Other Ambulatory Visit: Payer: Self-pay

## 2020-11-17 ENCOUNTER — Emergency Department (HOSPITAL_COMMUNITY): Payer: Medicare HMO

## 2020-11-17 DIAGNOSIS — R109 Unspecified abdominal pain: Secondary | ICD-10-CM | POA: Diagnosis present

## 2020-11-17 DIAGNOSIS — I10 Essential (primary) hypertension: Secondary | ICD-10-CM | POA: Diagnosis not present

## 2020-11-17 DIAGNOSIS — I517 Cardiomegaly: Secondary | ICD-10-CM | POA: Diagnosis not present

## 2020-11-17 DIAGNOSIS — Z791 Long term (current) use of non-steroidal anti-inflammatories (NSAID): Secondary | ICD-10-CM | POA: Diagnosis not present

## 2020-11-17 DIAGNOSIS — Z7901 Long term (current) use of anticoagulants: Secondary | ICD-10-CM | POA: Diagnosis not present

## 2020-11-17 DIAGNOSIS — Z79899 Other long term (current) drug therapy: Secondary | ICD-10-CM | POA: Insufficient documentation

## 2020-11-17 DIAGNOSIS — Z20822 Contact with and (suspected) exposure to covid-19: Secondary | ICD-10-CM | POA: Diagnosis not present

## 2020-11-17 DIAGNOSIS — K259 Gastric ulcer, unspecified as acute or chronic, without hemorrhage or perforation: Principal | ICD-10-CM | POA: Insufficient documentation

## 2020-11-17 DIAGNOSIS — I110 Hypertensive heart disease with heart failure: Secondary | ICD-10-CM

## 2020-11-17 DIAGNOSIS — F17210 Nicotine dependence, cigarettes, uncomplicated: Secondary | ICD-10-CM | POA: Diagnosis not present

## 2020-11-17 DIAGNOSIS — R140 Abdominal distension (gaseous): Secondary | ICD-10-CM | POA: Diagnosis not present

## 2020-11-17 DIAGNOSIS — X58XXXA Exposure to other specified factors, initial encounter: Secondary | ICD-10-CM | POA: Diagnosis not present

## 2020-11-17 DIAGNOSIS — R0902 Hypoxemia: Secondary | ICD-10-CM | POA: Diagnosis not present

## 2020-11-17 DIAGNOSIS — T182XXA Foreign body in stomach, initial encounter: Secondary | ICD-10-CM | POA: Diagnosis not present

## 2020-11-17 DIAGNOSIS — R1084 Generalized abdominal pain: Secondary | ICD-10-CM

## 2020-11-17 DIAGNOSIS — I2510 Atherosclerotic heart disease of native coronary artery without angina pectoris: Secondary | ICD-10-CM | POA: Diagnosis not present

## 2020-11-17 DIAGNOSIS — B9681 Helicobacter pylori [H. pylori] as the cause of diseases classified elsewhere: Secondary | ICD-10-CM | POA: Diagnosis not present

## 2020-11-17 DIAGNOSIS — N179 Acute kidney failure, unspecified: Secondary | ICD-10-CM | POA: Diagnosis not present

## 2020-11-17 DIAGNOSIS — I5042 Chronic combined systolic (congestive) and diastolic (congestive) heart failure: Secondary | ICD-10-CM | POA: Diagnosis not present

## 2020-11-17 DIAGNOSIS — J449 Chronic obstructive pulmonary disease, unspecified: Secondary | ICD-10-CM | POA: Insufficient documentation

## 2020-11-17 DIAGNOSIS — R0602 Shortness of breath: Secondary | ICD-10-CM | POA: Diagnosis not present

## 2020-11-17 DIAGNOSIS — Z8673 Personal history of transient ischemic attack (TIA), and cerebral infarction without residual deficits: Secondary | ICD-10-CM | POA: Diagnosis not present

## 2020-11-17 DIAGNOSIS — M47817 Spondylosis without myelopathy or radiculopathy, lumbosacral region: Secondary | ICD-10-CM | POA: Diagnosis not present

## 2020-11-17 DIAGNOSIS — R0689 Other abnormalities of breathing: Secondary | ICD-10-CM | POA: Diagnosis not present

## 2020-11-17 DIAGNOSIS — R000 Tachycardia, unspecified: Secondary | ICD-10-CM | POA: Diagnosis not present

## 2020-11-17 DIAGNOSIS — T39395A Adverse effect of other nonsteroidal anti-inflammatory drugs [NSAID], initial encounter: Secondary | ICD-10-CM

## 2020-11-17 DIAGNOSIS — Z8719 Personal history of other diseases of the digestive system: Secondary | ICD-10-CM

## 2020-11-17 DIAGNOSIS — K820 Obstruction of gallbladder: Secondary | ICD-10-CM | POA: Diagnosis not present

## 2020-11-17 DIAGNOSIS — G629 Polyneuropathy, unspecified: Secondary | ICD-10-CM

## 2020-11-17 MED ORDER — SODIUM CHLORIDE 0.9 % IV BOLUS
1000.0000 mL | Freq: Once | INTRAVENOUS | Status: AC
  Administered 2020-11-18: 1000 mL via INTRAVENOUS

## 2020-11-17 MED ORDER — SODIUM CHLORIDE 0.9 % IV SOLN
80.0000 mg | Freq: Once | INTRAVENOUS | Status: AC
  Administered 2020-11-18: 80 mg via INTRAVENOUS
  Filled 2020-11-17: qty 80

## 2020-11-17 NOTE — ED Triage Notes (Addendum)
Pt presents from home, c/o abd pain/distension and SOB but states he is not feeling SOB anymore. Wears 3L [place] at baseline. Also reports an episode of dark tarry stools. Pt just discharged for same

## 2020-11-18 ENCOUNTER — Emergency Department (HOSPITAL_COMMUNITY): Payer: Medicare HMO

## 2020-11-18 DIAGNOSIS — N179 Acute kidney failure, unspecified: Secondary | ICD-10-CM | POA: Diagnosis present

## 2020-11-18 DIAGNOSIS — K259 Gastric ulcer, unspecified as acute or chronic, without hemorrhage or perforation: Secondary | ICD-10-CM | POA: Diagnosis not present

## 2020-11-18 DIAGNOSIS — B9681 Helicobacter pylori [H. pylori] as the cause of diseases classified elsewhere: Secondary | ICD-10-CM | POA: Diagnosis not present

## 2020-11-18 DIAGNOSIS — R140 Abdominal distension (gaseous): Secondary | ICD-10-CM | POA: Diagnosis not present

## 2020-11-18 DIAGNOSIS — K253 Acute gastric ulcer without hemorrhage or perforation: Secondary | ICD-10-CM

## 2020-11-18 DIAGNOSIS — R1013 Epigastric pain: Secondary | ICD-10-CM | POA: Diagnosis not present

## 2020-11-18 DIAGNOSIS — R0602 Shortness of breath: Secondary | ICD-10-CM | POA: Diagnosis not present

## 2020-11-18 DIAGNOSIS — I10 Essential (primary) hypertension: Secondary | ICD-10-CM | POA: Diagnosis not present

## 2020-11-18 DIAGNOSIS — M47817 Spondylosis without myelopathy or radiculopathy, lumbosacral region: Secondary | ICD-10-CM | POA: Diagnosis not present

## 2020-11-18 DIAGNOSIS — I517 Cardiomegaly: Secondary | ICD-10-CM | POA: Diagnosis not present

## 2020-11-18 DIAGNOSIS — K820 Obstruction of gallbladder: Secondary | ICD-10-CM | POA: Diagnosis not present

## 2020-11-18 HISTORY — DX: Acute kidney failure, unspecified: N17.9

## 2020-11-18 LAB — HEMOGLOBIN AND HEMATOCRIT, BLOOD
HCT: 24.8 % — ABNORMAL LOW (ref 39.0–52.0)
Hemoglobin: 6.7 g/dL — CL (ref 13.0–17.0)

## 2020-11-18 LAB — CBC WITH DIFFERENTIAL/PLATELET
Abs Immature Granulocytes: 0.18 10*3/uL — ABNORMAL HIGH (ref 0.00–0.07)
Basophils Absolute: 0.1 10*3/uL (ref 0.0–0.1)
Basophils Relative: 0 %
Eosinophils Absolute: 0 10*3/uL (ref 0.0–0.5)
Eosinophils Relative: 0 %
HCT: 27.3 % — ABNORMAL LOW (ref 39.0–52.0)
Hemoglobin: 7.1 g/dL — ABNORMAL LOW (ref 13.0–17.0)
Immature Granulocytes: 1 %
Lymphocytes Relative: 8 %
Lymphs Abs: 1.4 10*3/uL (ref 0.7–4.0)
MCH: 23.4 pg — ABNORMAL LOW (ref 26.0–34.0)
MCHC: 26 g/dL — ABNORMAL LOW (ref 30.0–36.0)
MCV: 89.8 fL (ref 80.0–100.0)
Monocytes Absolute: 1.6 10*3/uL — ABNORMAL HIGH (ref 0.1–1.0)
Monocytes Relative: 9 %
Neutro Abs: 15.1 10*3/uL — ABNORMAL HIGH (ref 1.7–7.7)
Neutrophils Relative %: 82 %
Platelets: 205 10*3/uL (ref 150–400)
RBC: 3.04 MIL/uL — ABNORMAL LOW (ref 4.22–5.81)
RDW: 19.9 % — ABNORMAL HIGH (ref 11.5–15.5)
WBC: 18.3 10*3/uL — ABNORMAL HIGH (ref 4.0–10.5)
nRBC: 1 % — ABNORMAL HIGH (ref 0.0–0.2)

## 2020-11-18 LAB — BASIC METABOLIC PANEL
Anion gap: 6 (ref 5–15)
BUN: 37 mg/dL — ABNORMAL HIGH (ref 8–23)
CO2: 28 mmol/L (ref 22–32)
Calcium: 7.9 mg/dL — ABNORMAL LOW (ref 8.9–10.3)
Chloride: 105 mmol/L (ref 98–111)
Creatinine, Ser: 1.09 mg/dL (ref 0.61–1.24)
GFR, Estimated: >60 mL/min (ref >60)
Glucose, Bld: 113 mg/dL — ABNORMAL HIGH (ref 70–99)
Potassium: 4.4 mmol/L (ref 3.5–5.1)
Sodium: 139 mmol/L (ref 135–145)

## 2020-11-18 LAB — COMPREHENSIVE METABOLIC PANEL
ALT: 21 U/L (ref 0–44)
AST: 23 U/L (ref 15–41)
Albumin: 3.1 g/dL — ABNORMAL LOW (ref 3.5–5.0)
Alkaline Phosphatase: 49 U/L (ref 38–126)
Anion gap: 12 (ref 5–15)
BUN: 29 mg/dL — ABNORMAL HIGH (ref 8–23)
CO2: 26 mmol/L (ref 22–32)
Calcium: 8.2 mg/dL — ABNORMAL LOW (ref 8.9–10.3)
Chloride: 102 mmol/L (ref 98–111)
Creatinine, Ser: 2.13 mg/dL — ABNORMAL HIGH (ref 0.61–1.24)
GFR, Estimated: 33 mL/min — ABNORMAL LOW (ref >60)
Glucose, Bld: 199 mg/dL — ABNORMAL HIGH (ref 70–99)
Potassium: 4 mmol/L (ref 3.5–5.1)
Sodium: 140 mmol/L (ref 135–145)
Total Bilirubin: 0.6 mg/dL (ref 0.3–1.2)
Total Protein: 5.9 g/dL — ABNORMAL LOW (ref 6.5–8.1)

## 2020-11-18 LAB — DIFFERENTIAL
Abs Immature Granulocytes: 0.06 10*3/uL (ref 0.00–0.07)
Basophils Absolute: 0 10*3/uL (ref 0.0–0.1)
Basophils Relative: 0 %
Eosinophils Absolute: 0 10*3/uL (ref 0.0–0.5)
Eosinophils Relative: 0 %
Immature Granulocytes: 1 %
Lymphocytes Relative: 16 %
Lymphs Abs: 1.9 10*3/uL (ref 0.7–4.0)
Monocytes Absolute: 1.2 10*3/uL — ABNORMAL HIGH (ref 0.1–1.0)
Monocytes Relative: 11 %
Neutro Abs: 8.2 10*3/uL — ABNORMAL HIGH (ref 1.7–7.7)
Neutrophils Relative %: 72 %

## 2020-11-18 LAB — CBC
HCT: 22.4 % — ABNORMAL LOW (ref 39.0–52.0)
Hemoglobin: 5.9 g/dL — CL (ref 13.0–17.0)
MCH: 23.5 pg — ABNORMAL LOW (ref 26.0–34.0)
MCHC: 26.3 g/dL — ABNORMAL LOW (ref 30.0–36.0)
MCV: 89.2 fL (ref 80.0–100.0)
Platelets: 158 10*3/uL (ref 150–400)
RBC: 2.51 MIL/uL — ABNORMAL LOW (ref 4.22–5.81)
RDW: 19.9 % — ABNORMAL HIGH (ref 11.5–15.5)
WBC: 11.4 10*3/uL — ABNORMAL HIGH (ref 4.0–10.5)
nRBC: 0.4 % — ABNORMAL HIGH (ref 0.0–0.2)

## 2020-11-18 LAB — LIPASE, BLOOD: Lipase: 43 U/L (ref 11–51)

## 2020-11-18 LAB — RESP PANEL BY RT-PCR (FLU A&B, COVID) ARPGX2
Influenza A by PCR: NEGATIVE
Influenza B by PCR: NEGATIVE
SARS Coronavirus 2 by RT PCR: NEGATIVE

## 2020-11-18 LAB — PROTIME-INR
INR: 1.2 (ref 0.8–1.2)
Prothrombin Time: 15 seconds (ref 11.4–15.2)

## 2020-11-18 LAB — PREPARE RBC (CROSSMATCH)

## 2020-11-18 MED ORDER — ONDANSETRON HCL 4 MG/2ML IJ SOLN: 4.0000 mg | Freq: Four times a day (QID) | INTRAMUSCULAR | Status: DC | PRN

## 2020-11-18 MED ORDER — MOMETASONE FURO-FORMOTEROL FUM 100-5 MCG/ACT IN AERO
2.0000 | INHALATION_SPRAY | Freq: Two times a day (BID) | RESPIRATORY_TRACT | Status: DC
  Administered 2020-11-19 (×2): 2 via RESPIRATORY_TRACT
  Filled 2020-11-18: qty 8.8

## 2020-11-18 MED ORDER — ALBUTEROL SULFATE (2.5 MG/3ML) 0.083% IN NEBU: 2.5000 mg | INHALATION_SOLUTION | Freq: Four times a day (QID) | RESPIRATORY_TRACT | Status: DC | PRN

## 2020-11-18 MED ORDER — UMECLIDINIUM BROMIDE 62.5 MCG/INH IN AEPB
1.0000 | INHALATION_SPRAY | Freq: Every day | RESPIRATORY_TRACT | Status: DC
  Administered 2020-11-19: 1 via RESPIRATORY_TRACT
  Filled 2020-11-18: qty 7

## 2020-11-18 MED ORDER — LACTATED RINGERS IV SOLN: INTRAVENOUS | Status: DC

## 2020-11-18 MED ORDER — BUDESON-GLYCOPYRROL-FORMOTEROL 160-9-4.8 MCG/ACT IN AERO: 2.0000 | INHALATION_SPRAY | Freq: Two times a day (BID) | RESPIRATORY_TRACT | Status: DC

## 2020-11-18 MED ORDER — SODIUM CHLORIDE 0.9 % IV SOLN
100.0000 mg | Freq: Two times a day (BID) | INTRAVENOUS | Status: DC
  Administered 2020-11-18 – 2020-11-19 (×2): 100 mg via INTRAVENOUS
  Filled 2020-11-18 (×4): qty 100

## 2020-11-18 MED ORDER — SODIUM CHLORIDE 0.9% IV SOLUTION: Freq: Once | INTRAVENOUS | Status: AC

## 2020-11-18 MED ORDER — PANTOPRAZOLE SODIUM 40 MG IV SOLR
40.0000 mg | Freq: Two times a day (BID) | INTRAVENOUS | Status: DC
  Administered 2020-11-18 (×2): 40 mg via INTRAVENOUS
  Filled 2020-11-18 (×3): qty 40

## 2020-11-18 MED ORDER — METRONIDAZOLE 500 MG/100ML IV SOLN
500.0000 mg | Freq: Three times a day (TID) | INTRAVENOUS | Status: DC
  Administered 2020-11-18 – 2020-11-19 (×2): 500 mg via INTRAVENOUS
  Filled 2020-11-18 (×2): qty 100

## 2020-11-18 MED ORDER — SODIUM CHLORIDE 0.9 % IV SOLN: INTRAVENOUS | Status: DC

## 2020-11-18 MED ORDER — ONDANSETRON HCL 4 MG PO TABS: 4.0000 mg | ORAL_TABLET | Freq: Four times a day (QID) | ORAL | Status: DC | PRN

## 2020-11-18 NOTE — H&P (Signed)
History and Physical    Savior Basnight LGX:[id number] DOB: February 07, 1956 DOA: 11/17/2020  PCP: [name], MD  Patient coming from: Home  Chief Complaint: stomach pain  HPI: Jared [name] is a 65 y.o. male with medical history significant of COPD, combined systolic/diastolic HF, paroxsymal a fib, CVA, GIB. Presents with stomach pain. Reports his symptoms started yesterday evening. It was a global, gnawing pain. It was 6-7/10 and constant. He didn't try any medications to help. He started feeling weak, short of breath, and sweaty; so he called EMS. He denies any other aggravating or alleviating factors.    ED Course: Found to be hypotensive. Given fluids and started on protonix. CXR was negative for acute change. CT ab/pelvis was negative. TRH was called for admission.   Review of Systems:  Denies CP, palpitations, syncopal episodes, N/V/D. Review of systems is otherwise negative for all not mentioned in HPI.   PMHx Past Medical History:  Diagnosis Date   AVM (arteriovenous malformation)    CAD (coronary artery disease)    a. LHC 5/12:  LAD 20, pLCx 20, pRCA 40, dRCA 40, EF 35%, diff HK  //  b. Myoview 4/16: Overall Impression:  High risk stress nuclear study There is no evidence of ischemia.  There is severe LV dysfunction. LV Ejection Fraction: 30%.  LV Wall Motion:  There is global LV hypokinesis.     CAP (community acquired pneumonia) 09/2013   Chronic combined systolic and diastolic CHF (congestive heart failure) (HCC)    a. Echo 4/16:Mild LVH, EF 40-45%, diffuse HK //  b. Echo 8/17: EF 35-40%, diffuse HK, diastolic dysfunction, aortic sclerosis, trivial MR, moderate LAE, normal RVSF, moderate RAE, mild TR, PASP 42 mmHg // c. Echo 4/18: Mild concentric LVH, EF 30-35, normal wall motion, grade 1 diastolic dysfunction, PASP 49   Chronic respiratory failure (HCC)    Cluster headache    "hx; haven't had one in awhile" (01/09/2016)   COPD (chronic obstructive pulmonary disease) (HCC)    [name]  01/09/2016   History of CVA (cerebrovascular accident)    Hypertension    IDA (iron deficiency anemia)    Moderate tobacco use disorder    NICM (nonischemic cardiomyopathy) (HCC)    Nicotine addiction    Tobacco abuse     PSHx Past Surgical History:  Procedure Laterality Date   BIOPSY  11/12/2020   Procedure: BIOPSY;  Surgeon: Lemar Lofty., MD;  Location: [facility] ENDOSCOPY;  Service: Gastroenterology;;   CARDIAC CATHETERIZATION  10/2010   LM normal, LAD with 20% irregularities, LCX with 20%, RCA with 40% prox and 40% distal - EF of 35%   CATARACT EXTRACTION, BILATERAL     COLONOSCOPY W/ BIOPSIES AND POLYPECTOMY     COLONOSCOPY WITH PROPOFOL N/A 09/06/2018   Procedure: COLONOSCOPY WITH PROPOFOL;  Surgeon: Tressia Danas, MD;  Location: [facility] ENDOSCOPY;  Service: Gastroenterology;  Laterality: N/A;   ENTEROSCOPY N/A 09/28/2018   Procedure: ENTEROSCOPY;  Surgeon: Jeani Hawking, MD;  Location: [facility] ENDOSCOPY;  Service: Endoscopy;  Laterality: N/A;   ENTEROSCOPY N/A 10/28/2018   Procedure: ENTEROSCOPY;  Surgeon: Tressia Danas, MD;  Location: [facility] ENDOSCOPY;  Service: Gastroenterology;  Laterality: N/A;   ENTEROSCOPY N/A 10/09/2020   Procedure: ENTEROSCOPY;  Surgeon: Hilarie Fredrickson, MD;  Location: [facility] ENDOSCOPY;  Service: Endoscopy;  Laterality: N/A;   ESOPHAGOGASTRODUODENOSCOPY N/A 11/12/2020   Procedure: ESOPHAGOGASTRODUODENOSCOPY (EGD);  Surgeon: Lemar Lofty., MD;  Location: [facility] ENDOSCOPY;  Service: Gastroenterology;  Laterality: N/A;   ESOPHAGOGASTRODUODENOSCOPY (EGD) WITH PROPOFOL N/A 09/05/2018  Procedure: ESOPHAGOGASTRODUODENOSCOPY (EGD) WITH PROPOFOL;  Surgeon: Benancio Deeds, MD;  Location: [facility] ENDOSCOPY;  Service: Gastroenterology;  Laterality: N/A;   EXCISION MASS HEAD     GIVENS CAPSULE STUDY N/A 09/06/2018   Procedure: GIVENS CAPSULE STUDY;  Surgeon: Tressia Danas, MD;  Location: [facility] ENDOSCOPY;  Service: Gastroenterology;  Laterality: N/A;   GIVENS CAPSULE STUDY N/A  09/26/2018   Procedure: GIVENS CAPSULE STUDY;  Surgeon: Beverley Fiedler, MD;  Location: [facility] ENDOSCOPY;  Service: Gastroenterology;  Laterality: N/A;   GIVENS CAPSULE STUDY N/A 06/14/2019   Procedure: GIVENS CAPSULE STUDY;  Surgeon: Napoleon Form, MD;  Location: [facility] ENDOSCOPY;  Service: Endoscopy;  Laterality: N/A;   HEMOSTASIS CLIP PLACEMENT  11/12/2020   Procedure: HEMOSTASIS CLIP PLACEMENT;  Surgeon: Lemar Lofty., MD;  Location: [facility] ENDOSCOPY;  Service: Gastroenterology;;   HEMOSTASIS CONTROL  11/12/2020   Procedure: HEMOSTASIS CONTROL;  Surgeon: Lemar Lofty., MD;  Location: [facility] ENDOSCOPY;  Service: Gastroenterology;;   HOT HEMOSTASIS N/A 10/28/2018   Procedure: HOT HEMOSTASIS (ARGON PLASMA COAGULATION/BICAP);  Surgeon: Tressia Danas, MD;  Location: [facility] ENDOSCOPY;  Service: Gastroenterology;  Laterality: N/A;   HOT HEMOSTASIS N/A 11/12/2020   Procedure: HOT HEMOSTASIS (ARGON PLASMA COAGULATION/BICAP);  Surgeon: Lemar Lofty., MD;  Location: [facility] ENDOSCOPY;  Service: Gastroenterology;  Laterality: N/A;   INCISION AND DRAINAGE PERIRECTAL ABSCESS N/A 06/05/2017   Procedure: IRRIGATION AND DEBRIDEMENT PERIRECTAL ABSCESS;  Surgeon: Andria Meuse, MD;  Location: [facility] OR;  Service: General;  Laterality: N/A;   SUBMUCOSAL TATTOO INJECTION  11/12/2020   Procedure: SUBMUCOSAL TATTOO INJECTION;  Surgeon: Lemar Lofty., MD;  Location: [facility] ENDOSCOPY;  Service: Gastroenterology;;   VIDEO BRONCHOSCOPY Bilateral 05/08/2016   Procedure: VIDEO BRONCHOSCOPY WITH FLUORO;  Surgeon: Oretha Milch, MD;  Location: [facility] ENDOSCOPY;  Service: Cardiopulmonary;  Laterality: Bilateral;    SocHx  reports that he has been smoking cigarettes. He has a 94.00 pack-year smoking history. He has never used smokeless tobacco. He reports previous alcohol use. He reports that he does not use drugs.  Allergies  Allergen Reactions   Lisinopril Cough    FamHx Family History  Problem Relation Age  of Onset   Heart disease Mother    Diabetes Mother    Colon cancer Mother    Liver cancer Mother    Cancer Father        type unknown   Diabetes Sister        x 2   Diabetes Brother     Prior to Admission medications   Medication Sig Start Date End Date Taking? Authorizing Provider  albuterol (PROVENTIL) (2.5 MG/3ML) 0.083% nebulizer solution Take 3 mLs (2.5 mg total) by nebulization every 4 (four) hours as needed for wheezing or shortness of breath. 11/01/20   Allayne Stack, DO  albuterol (VENTOLIN HFA) 108 (90 Base) MCG/ACT inhaler INHALE 2 PUFFS INTO THE LUNGS EVERY 6 (SIX) HOURS AS NEEDED FOR WHEEZING OR SHORTNESS OF BREATH. 08/15/20 08/15/21  [name], MD  atorvastatin (LIPITOR) 80 MG tablet TAKE 1 TABLET EVERY DAY 08/03/20   [name], MD  bismuth subsalicylate (PEPTO BISMOL) 262 MG chewable tablet Chew 2 tablets (524 mg total) by mouth 4 (four) times daily. 11/15/20   Esaw Grandchild A, DO  Budeson-Glycopyrrol-Formoterol 160-9-4.8 MCG/ACT AERO INHALE 2 PUFFS INTO THE LUNGS 2 (TWO) TIMES DAILY. 04/27/20 04/27/21  Nyoka Cowden, MD  doxycycline (VIBRA-TABS) 100 MG tablet Take 1 tablet (100 mg total) by mouth every 12 (twelve) hours for 14 days.  11/15/20 11/30/20  Esaw Grandchild A, DO  ferrous sulfate 325 (65 FE) MG tablet Take 1 tablet (325 mg total) by mouth 2 (two) times daily with a meal. 11/16/20   Esaw Grandchild A, DO  furosemide (LASIX) 40 MG tablet TAKE 1 TABLET EVERY DAY Patient taking differently: Take 40 mg by mouth daily. 08/03/20   [name], MD  gabapentin (NEURONTIN) 300 MG capsule TAKE 1 CAPSULE (300 MG TOTAL) BY MOUTH AT BEDTIME. Patient taking differently: Take 300 mg by mouth at bedtime. 03/23/20 03/23/21  [name], MD  metroNIDAZOLE (FLAGYL) 250 MG tablet Take 1 tablet (250 mg total) by mouth every 12 (twelve) hours for 14 days. 11/15/20 12/01/20  Pennie Banter, DO  Misc. Devices MISC Portable oxygen concentrator. Diagnosis COPD. Patient taking  differently: Inhale 3 L into the lungs. Portable oxygen concentrator. Diagnosis COPD. 12/05/18   [name], MD  Multiple Vitamin (MULTIVITAMIN WITH MINERALS) TABS tablet Take 1 tablet by mouth daily. 11/16/20   Pennie Banter, DO  nitroGLYCERIN (NITROSTAT) 0.4 MG SL tablet PLACE 1 TABLET (0.4 MG TOTAL) UNDER THE TONGUE EVERY 5 (FIVE) MINUTES AS NEEDED FOR CHEST PAIN. CALL 911 IF SYMPTOMS DO NOT RESOLVE AFTER 3 MINUTES Patient taking differently: Place 0.4 mg under the tongue every 5 (five) minutes as needed for chest pain. 06/14/20 06/14/21  [name], MD  pantoprazole (PROTONIX) 40 MG tablet Take 1 tablet (40 mg total) by mouth 2 (two) times daily. 11/15/20 12/15/20  Esaw Grandchild A, DO  XARELTO 20 MG TABS tablet Take 20 mg by mouth daily. 10/18/20   [provider]  TUDORZA PRESSAIR 400 MCG/ACT AEPB INHALE 1 PUFF INTO THE LUNGS IN THE MORNING AND AT BEDTIME. 03/29/20 04/04/20  Nyoka Cowden, MD    Physical Exam: Vitals:   11/18/20 0315 11/18/20 0345 11/18/20 0400 11/18/20 0500  BP: 118/68 97/67 97/65  110/69  Pulse: 89 95 92 (!) 103  Resp: 16 17 17  (!) 22  Temp:      TempSrc:      SpO2: 100% 100% 100% 100%  Weight:      Height:        General: 65 y.o. male resting in bed in NAD Eyes: PERRL, normal sclera ENMT: Nares patent w/o discharge, orophaynx clear, dentition normal, ears w/o discharge/lesions/ulcers Neck: Supple, trachea midline Cardiovascular: RRR, +S1, S2, no m/g/r, equal pulses throughout Respiratory: CTABL, no w/r/r, normal WOB GI: BS+, NDNT, no masses noted, no organomegaly noted MSK: No e/c/c Skin: No rashes, bruises, ulcerations noted Neuro: A&O x 3, no focal deficits Psyc: Appropriate interaction and affect, calm/cooperative  Labs on Admission: I have personally reviewed following labs and imaging studies  CBC: Recent Labs  Lab 11/11/20 2151 11/12/20 0359 11/14/20 0419 11/14/20 1124 11/14/20 1724 11/14/20 2303 11/15/20 0246 11/15/20 1306  11/18/20 0048  WBC 12.9*  --  11.4*  --   --   --  10.5  --  18.3*  NEUTROABS  --   --   --   --   --   --   --   --  15.1*  HGB 8.7*   < > 7.4*   < > 7.3* 7.3* 7.4* 7.7* 7.1*  HCT 33.9*   < > 28.2*   < > 27.3* 27.3* 27.6* 28.7* 27.3*  MCV 89.9  --  91.3  --   --   --  89.6  --  89.8  PLT 184  --  130*  --   --   --  126*  --  205   < > = values in this interval not displayed.   Basic Metabolic Panel: Recent Labs  Lab 11/12/20 0359 11/13/20 0202 11/13/20 1324 11/14/20 0419 11/15/20 0246 11/18/20 0048  NA 143 140  --  138 140 140  K 4.5 5.4* 4.4 4.2 3.5 4.0  CL 102 100  --  97* 98 102  CO2 38* 36*  --  37* 36* 26  GLUCOSE 135* 153*  --  110* 147* 199*  BUN 50* 30*  --  17 10 29*  CREATININE 0.98 1.08  --  1.00 1.00 2.13*  CALCIUM 8.0* 8.2*  --  8.2* 8.2* 8.2*  MG 2.1  --   --   --   --   --    GFR: Estimated Creatinine Clearance: 40.2 mL/min (A) (by C-G formula based on SCr of 2.13 mg/dL (H)). Liver Function Tests: Recent Labs  Lab 11/11/20 2151 11/18/20 0048  AST 40 23  ALT 42 21  ALKPHOS 50 49  BILITOT 0.7 0.6  PROT 4.8* 5.9*  ALBUMIN 2.8* 3.1*   Recent Labs  Lab 11/18/20 0048  LIPASE 43   No results for input(s): AMMONIA in the last 168 hours. Coagulation Profile: Recent Labs  Lab 11/18/20 0048  INR 1.2   Cardiac Enzymes: No results for input(s): CKTOTAL, CKMB, CKMBINDEX, TROPONINI in the last 168 hours. BNP (last 3 results) No results for input(s): PROBNP in the last 8760 hours. HbA1C: No results for input(s): HGBA1C in the last 72 hours. CBG: No results for input(s): GLUCAP in the last 168 hours. Lipid Profile: No results for input(s): CHOL, HDL, LDLCALC, TRIG, CHOLHDL, LDLDIRECT in the last 72 hours. Thyroid Function Tests: No results for input(s): TSH, T4TOTAL, FREET4, T3FREE, THYROIDAB in the last 72 hours. Anemia Panel: No results for input(s): VITAMINB12, FOLATE, FERRITIN, TIBC, IRON, RETICCTPCT in the last 72 hours. Urine analysis:     Component Value Date/Time   COLORURINE YELLOW 07/25/2016 1845   APPEARANCEUR HAZY (A) 07/25/2016 1845   LABSPEC 1.017 07/25/2016 1845   PHURINE 5.0 07/25/2016 1845   GLUCOSEU 50 (A) 07/25/2016 1845   HGBUR SMALL (A) 07/25/2016 1845   BILIRUBINUR NEGATIVE 07/25/2016 1845   KETONESUR NEGATIVE 07/25/2016 1845   PROTEINUR 100 (A) 07/25/2016 1845   NITRITE NEGATIVE 07/25/2016 1845   LEUKOCYTESUR NEGATIVE 07/25/2016 1845    Radiological Exams on Admission: CT ABDOMEN PELVIS WO CONTRAST  Result Date: 11/18/2020 CLINICAL DATA:  Nonlocalized acute abdominal pain. abdominal pain, distension, dark stools, recent discharge for same, wbc's 18, cr 2.13 was 1.00 on 11/15/20, GFR 33, negative COVID 11/17/20, hx of HTN and peri rectal abscess. Recent gastric endoscopy procedure few days ago. EXAM: CT ABDOMEN AND PELVIS WITHOUT CONTRAST TECHNIQUE: Multidetector CT imaging of the abdomen and pelvis was performed following the standard protocol without IV contrast. COMPARISON:  CT abdomen pelvis 08/18/2020 FINDINGS: Lower chest: Emphysematous changes. Bilateral lower lobe atelectasis. Hepatobiliary: No focal liver abnormality. The gallbladder is contracted. No gallstones, gallbladder wall thickening, or pericholecystic fluid. No biliary dilatation. Pancreas: No focal lesion. Normal pancreatic contour. No surrounding inflammatory changes. No main pancreatic ductal dilatation. Spleen: Normal in size without focal abnormality. Adrenals/Urinary Tract: No adrenal nodule bilaterally. Vascular calcifications versus punctate nephrolithiasis within the left kidney (2:23). Otherwise no definite nephrolithiasis, no hydronephrosis, and no contour-deforming renal mass. No ureterolithiasis or hydroureter. The urinary bladder is unremarkable. Stomach/Bowel: Total of four linear metallic densities measuring up to approximately 1.5 cm along the lesser curvature of  the gastric lumen (2:14, 5:19) consistent with endoluminal clips/gastric  procedure. Ingested material noted within the gastric lumen. Stomach is within normal limits. No evidence of bowel wall thickening or dilatation. No pneumatosis. Appendix appears normal. No findings of perirectal abscess. Vascular/Lymphatic: No abdominal aorta or iliac aneurysm. Severe atherosclerotic plaque of the aorta and its branches. No abdominal, pelvic, or inguinal lymphadenopathy. Reproductive: Prostate is unremarkable. Other: No intraperitoneal free fluid. No intraperitoneal free gas. No organized fluid collection. Musculoskeletal: No abdominal wall hernia or abnormality. No suspicious lytic or blastic osseous lesions. No acute displaced fracture. Multilevel degenerative changes of the spine with L5-S1 intervertebral disc space vacuum phenomenon and endplate sclerosis. IMPRESSION: 1. No acute intra-abdominal or intrapelvic abnormality with limited evaluation on this noncontrast study. No definite bowel perforation in a patient status post recent endoscopy. 2.  Aortic Atherosclerosis (ICD10-I70.0) - severe. 3.  Emphysema (ICD10-J43.9). These results were called by telephone at the time of interpretation on 11/18/2020 at 4:23 am to provider JOSHUA LONG , who verbally acknowledged these results. Electronically Signed   By: Tish Frederickson M.D.   On: 11/18/2020 04:24   DG Chest Portable 1 View  Result Date: 11/18/2020 CLINICAL DATA:  Initial evaluation for acute shortness of breath. EXAM: PORTABLE CHEST 1 VIEW COMPARISON:  Prior radiograph from 11/01/2020 FINDINGS: Cardiomegaly, stable. Mediastinal silhouette within normal limits. Aortic atherosclerosis. Lungs no well inflated. No pulmonary edema or visible pleural effusion. No focal infiltrates. No pneumothorax. No acute osseous finding. Multiple remotely healed left-sided rib fractures noted. IMPRESSION: 1. No active cardiopulmonary disease. 2. Stable cardiomegaly without pulmonary edema. 3.  Aortic Atherosclerosis (ICD10-I70.0). Electronically Signed    By: Rise Mu M.D.   On: 11/18/2020 01:51    EKG: None obtained in ED  Assessment/Plan Abdominal pain Recent H. Pylori infection     - placed in obs, tele     - continue protonix     - continue flagyl, doxy     - pain control     - IVF     - rpt CBC and trend H&H; he was dry at presentation, so it would not be surprising to see that his Hgb has dropped after some fluids     - [facility] has been contacted; will all ice chips only for now until seen by their service, appreciate their help  AKI     - fluids, check renal US; follow  A fib     - holding xarelto d/t recent GIB  Leukocytosis     - ?reactive?; no fevers, no indication of infection     - can follow for now  Chronic combined systolic/diastolic HF     - holding lasix for now d/t hypotension     - getting fluids, watch I&O, daily wts     - reassess for resuming lasix in AM  HLD     - resume home statin when able  Hx of CVA     - resume home statin when able  COPD     - continue home inhalers  DVT prophylaxis: SCDs  Code Status: FULL  Family Communication: None at bedside  Consults called: [facility]   Status is: Observation  The patient remains OBS appropriate and will d/c before 2 midnights.  Dispo: The patient is from: Home              Anticipated d/c is to: Home              Patient currently is not medically  stable to d/c.   Difficult to place patient No  Teddy Spike DO [facility]  If 7PM-7AM, please contact night-coverage [email or web address]  11/18/2020, 7:04 AM

## 2020-11-18 NOTE — Consult Note (Signed)
Consultation  Referring Provider: Dr. Ronaldo Miyamoto    Primary Care Physician:  [name], MD Primary Gastroenterologist: Dr. Myrtie Neither        Reason for Consultation: Anemia, history of gastric ulcers, abdominal pain         HPI:   Jared [name] is a 65 y.o. male with a past medical history significant for COPD, chronic hypoxic and hypercarbic respiratory failure on home O2, chronic combined systolic and diastolic CHF, paroxysmal A. fib on Xarelto, history of CVA and history of GI bleeding, who returns to the emergency department for evaluation of abdominal pain.    Patient recently in hospital and consulted by our service 6/4 for hematemesis.  At that time hemoglobin is down to 8.2 (previously increased to 11 on 5/24).  Xarelto has been on hold with last dose 6/2.  At that time underwent EGD/enteroscopy.    11/12/2020 EGD/enteroscopy with no gross lesions in the esophagus proximally, salmon-colored mucosa noted in the distal esophagus, hematin in the entire stomach, 2 gastric ulcers with a nonbleeding visible vessel, gastritis, blood in the entire examined duodenum, no gross lesions in the duodenum, jejunal blood lavaged, no gross lesions in the entire examined proximal jejunum, tattoo to distal extent.  It was discussed at that time that if he developed transfusion dependent anemia while still in the hospital he may need EGD.  Was discussed that he may need repeat PCD in the future given no clear finding on recent full enteroscopy just a month prior.  Repeat EGD recommended in 3 months to evaluate for healing of gastric ulcers and significant gastritis.  It appears his Xarelto was stopped at that admission.  Biopsies were positive for H. pylori.    Today, patient explains that he started with abdominal pain on the evening of 6/8 which was "gnawing", constant 6-12/2008 and constant.  Then started to feel weak, short of breath and sweaty so he called EMS.  Tells me he has not been seeing any blood at all,  nothing like his normal presentation.  Tells me now his abdominal pain is completely gone but "Lialda got to do something", because he has been here "too often" recently.  He had not been taking his Xarelto.    Denies fever, chills, weight loss or change in stools.  ED course: Hypotensive, CT abdomen pelvis negative; CBC with a hemoglobin 7.1 (7.3 on 6/6--> 7.4 on 6/7--> 7.7 on 6/7)  GI history:  Hospitalized 11/10/2020-11/15/2020 for anemia and GI bleed (see above)  Hospitalized 09/2020 for anemia.  Small bowel enteroscopy 10/09/2020 by Dr. Marina Goodell was normal.   08/18/2020 CT abdomen pelvis with contrast with no findings to explain the patient's clinical history, emphysema, aortic atherosclerosis; Dr. Myrtie Neither explained that likely the symptoms were due to hyperinflated lungs from his COPD, but there was nothing that he could do about this   Office visit 08/09/2020 with Dr. 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, despite extensive work-up, this problem remained complex and most likely cannot be definitively solved endoscopically, small bowel AVMs typically evanescent, making them difficult to diagnose and treat endoscopically, particularly when they are in the length of the small bowel, at that point patient was referred to hematology for close monitoring of hemoglobin and iron levels with periodic IV iron treatments, it was  suspected that he would be more successful on oral iron and keeping his hemoglobin in a safer range, improving his functional status and trying to keep him out of the hospital, it was discussed that if above was done and he still had severe anemia then strong consideration should be given to permanently discontinuing his oral anticoagulation"   06/14/2019 capsule endoscopy: Complete capsule endoscopy with findings of gastric erythema/gastritis, no AVMs visualized, no active bleeding 10/28/2018 small bowel enteroscopy: Small AVM ablated, otherwise normal 09/28/2018 small bowel enteroscopy: Normal 09/26/2018 capsule endoscopy: Active bleeding 1 minute after entry into the duodenum, enteroscopy scheduled the next day  Past Medical History:  Diagnosis Date   AVM (arteriovenous malformation)    CAD (coronary artery disease)    a. LHC 5/12:  LAD 20, pLCx 20, pRCA 40, dRCA 40, EF 35%, diff HK  //  b. Myoview 4/16: Overall Impression:  High risk stress nuclear study There is no evidence of ischemia.  There is severe LV dysfunction. LV Ejection Fraction: 30%.  LV Wall Motion:  There is global LV hypokinesis.     CAP (community acquired pneumonia) 09/2013   Chronic combined systolic and diastolic CHF (congestive heart failure) (HCC)    a. Echo 4/16:Mild LVH, EF 40-45%, diffuse HK //  b. Echo 8/17: EF 35-40%, diffuse HK, diastolic dysfunction, aortic sclerosis, trivial MR, moderate LAE, normal RVSF, moderate RAE, mild TR, PASP 42 mmHg // c. Echo 4/18: Mild concentric LVH, EF 30-35, normal wall motion, grade 1 diastolic dysfunction, PASP 49   Chronic respiratory failure (HCC)    Cluster headache    "hx; haven't had one in awhile" (01/09/2016)   COPD (chronic obstructive pulmonary disease) (HCC)    [name] 01/09/2016   History of CVA (cerebrovascular accident)    Hypertension    IDA (iron deficiency anemia)    Moderate tobacco use disorder    NICM (nonischemic cardiomyopathy) (HCC)    Nicotine addiction     Tobacco abuse     Past Surgical History:  Procedure Laterality Date   BIOPSY  11/12/2020   Procedure: BIOPSY;  Surgeon: Lemar Lofty., MD;  Location: [facility] ENDOSCOPY;  Service: Gastroenterology;;   CARDIAC CATHETERIZATION  10/2010   LM normal, LAD with 20% irregularities, LCX with 20%, RCA with 40% prox and 40% distal - EF of 35%   CATARACT EXTRACTION, BILATERAL     COLONOSCOPY W/ BIOPSIES AND POLYPECTOMY     COLONOSCOPY WITH PROPOFOL N/A 09/06/2018   Procedure: COLONOSCOPY WITH PROPOFOL;  Surgeon: Tressia Danas, MD;  Location: [facility] ENDOSCOPY;  Service: Gastroenterology;  Laterality: N/A;   ENTEROSCOPY N/A 09/28/2018   Procedure: ENTEROSCOPY;  Surgeon: Jeani Hawking, MD;  Location: [facility] ENDOSCOPY;  Service: Endoscopy;  Laterality: N/A;   ENTEROSCOPY N/A 10/28/2018   Procedure: ENTEROSCOPY;  Surgeon: Tressia Danas, MD;  Location: [facility] ENDOSCOPY;  Service: Gastroenterology;  Laterality: N/A;   ENTEROSCOPY N/A 10/09/2020   Procedure: ENTEROSCOPY;  Surgeon: Hilarie Fredrickson, MD;  Location: [facility] ENDOSCOPY;  Service: Endoscopy;  Laterality: N/A;   ESOPHAGOGASTRODUODENOSCOPY N/A 11/12/2020   Procedure: ESOPHAGOGASTRODUODENOSCOPY (EGD);  Surgeon: Lemar Lofty.,  MD;  Location: [facility] ENDOSCOPY;  Service: Gastroenterology;  Laterality: N/A;   ESOPHAGOGASTRODUODENOSCOPY (EGD) WITH PROPOFOL N/A 09/05/2018   Procedure: ESOPHAGOGASTRODUODENOSCOPY (EGD) WITH PROPOFOL;  Surgeon: Benancio Deeds, MD;  Location: [facility] ENDOSCOPY;  Service: Gastroenterology;  Laterality: N/A;   EXCISION MASS HEAD     GIVENS CAPSULE STUDY N/A 09/06/2018   Procedure: GIVENS CAPSULE STUDY;  Surgeon: Tressia Danas, MD;  Location: [facility] ENDOSCOPY;  Service: Gastroenterology;  Laterality: N/A;   GIVENS CAPSULE STUDY N/A 09/26/2018   Procedure: GIVENS CAPSULE STUDY;  Surgeon: Beverley Fiedler, MD;  Location: [facility] ENDOSCOPY;  Service: Gastroenterology;  Laterality: N/A;   GIVENS CAPSULE STUDY N/A 06/14/2019   Procedure: GIVENS CAPSULE  STUDY;  Surgeon: Napoleon Form, MD;  Location: [facility] ENDOSCOPY;  Service: Endoscopy;  Laterality: N/A;   HEMOSTASIS CLIP PLACEMENT  11/12/2020   Procedure: HEMOSTASIS CLIP PLACEMENT;  Surgeon: Lemar Lofty., MD;  Location: [facility] ENDOSCOPY;  Service: Gastroenterology;;   HEMOSTASIS CONTROL  11/12/2020   Procedure: HEMOSTASIS CONTROL;  Surgeon: Lemar Lofty., MD;  Location: [facility] ENDOSCOPY;  Service: Gastroenterology;;   HOT HEMOSTASIS N/A 10/28/2018   Procedure: HOT HEMOSTASIS (ARGON PLASMA COAGULATION/BICAP);  Surgeon: Tressia Danas, MD;  Location: [facility] ENDOSCOPY;  Service: Gastroenterology;  Laterality: N/A;   HOT HEMOSTASIS N/A 11/12/2020   Procedure: HOT HEMOSTASIS (ARGON PLASMA COAGULATION/BICAP);  Surgeon: Lemar Lofty., MD;  Location: [facility] ENDOSCOPY;  Service: Gastroenterology;  Laterality: N/A;   INCISION AND DRAINAGE PERIRECTAL ABSCESS N/A 06/05/2017   Procedure: IRRIGATION AND DEBRIDEMENT PERIRECTAL ABSCESS;  Surgeon: Andria Meuse, MD;  Location: [facility] OR;  Service: General;  Laterality: N/A;   SUBMUCOSAL TATTOO INJECTION  11/12/2020   Procedure: SUBMUCOSAL TATTOO INJECTION;  Surgeon: Lemar Lofty., MD;  Location: [facility] ENDOSCOPY;  Service: Gastroenterology;;   VIDEO BRONCHOSCOPY Bilateral 05/08/2016   Procedure: VIDEO BRONCHOSCOPY WITH FLUORO;  Surgeon: Oretha Milch, MD;  Location: [facility] ENDOSCOPY;  Service: Cardiopulmonary;  Laterality: Bilateral;    Family History  Problem Relation Age of Onset   Heart disease Mother    Diabetes Mother    Colon cancer Mother    Liver cancer Mother    Cancer Father        type unknown   Diabetes Sister        x 2   Diabetes Brother     Social History   Tobacco Use   Smoking status: Some Days    Packs/day: 2.00    Years: 47.00    Pack years: 94.00    Types: Cigarettes   Smokeless tobacco: Never  Vaping Use   Vaping Use: Never used  Substance Use Topics   Alcohol use: Not Currently    Alcohol/week: 0.0  standard drinks    Comment: last drink was before xmas   Drug use: No    Types: Cocaine, Marijuana    Comment: "nothing in 20 years"    Prior to Admission medications   Medication Sig Start Date End Date Taking? Authorizing Provider  albuterol (PROVENTIL) (2.5 MG/3ML) 0.083% nebulizer solution Take 3 mLs (2.5 mg total) by nebulization every 4 (four) hours as needed for wheezing or shortness of breath. 11/01/20  Yes Beard, Samantha N, DO  albuterol (VENTOLIN HFA) 108 (90 Base) MCG/ACT inhaler INHALE 2 PUFFS INTO THE LUNGS EVERY 6 (SIX) HOURS AS NEEDED FOR WHEEZING OR SHORTNESS OF BREATH. 08/15/20 08/15/21 Yes Newlin, Enobong, MD  atorvastatin (LIPITOR) 80 MG tablet TAKE 1 TABLET EVERY DAY Patient taking differently: Take 80 mg by mouth daily.  08/03/20  Yes [name], MD  Budeson-Glycopyrrol-Formoterol 160-9-4.8 MCG/ACT AERO INHALE 2 PUFFS INTO THE LUNGS 2 (TWO) TIMES DAILY. 04/27/20 04/27/21 Yes Nyoka Cowden, MD  doxycycline (VIBRA-TABS) 100 MG tablet Take 1 tablet (100 mg total) by mouth every 12 (twelve) hours for 14 days. 11/15/20 11/30/20 Yes Esaw Grandchild A, DO  ferrous sulfate 325 (65 FE) MG tablet Take 1 tablet (325 mg total) by mouth 2 (two) times daily with a meal. 11/16/20  Yes Esaw Grandchild A, DO  furosemide (LASIX) 40 MG tablet TAKE 1 TABLET EVERY DAY Patient taking differently: Take 40 mg by mouth daily. 08/03/20  Yes Newlin, Odette Horns, MD  gabapentin (NEURONTIN) 300 MG capsule TAKE 1 CAPSULE (300 MG TOTAL) BY MOUTH AT BEDTIME. Patient taking differently: Take 300 mg by mouth at bedtime. 03/23/20 03/23/21 Yes [name], MD  metroNIDAZOLE (FLAGYL) 250 MG tablet Take 1 tablet (250 mg total) by mouth every 12 (twelve) hours for 14 days. 11/15/20 12/01/20 Yes Esaw Grandchild A, DO  nitroGLYCERIN (NITROSTAT) 0.4 MG SL tablet PLACE 1 TABLET (0.4 MG TOTAL) UNDER THE TONGUE EVERY 5 (FIVE) MINUTES AS NEEDED FOR CHEST PAIN. CALL 911 IF SYMPTOMS DO NOT RESOLVE AFTER 3 MINUTES Patient taking  differently: Place 0.4 mg under the tongue every 5 (five) minutes as needed for chest pain. 06/14/20 06/14/21 Yes [name], MD  pantoprazole (PROTONIX) 40 MG tablet Take 1 tablet (40 mg total) by mouth 2 (two) times daily. 11/15/20 12/15/20 Yes Esaw Grandchild A, DO  bismuth subsalicylate (PEPTO BISMOL) 262 MG chewable tablet Chew 2 tablets (524 mg total) by mouth 4 (four) times daily. Patient not taking: Reported on 11/18/2020 11/15/20   Pennie Banter, DO  Misc. Devices MISC Portable oxygen concentrator. Diagnosis COPD. Patient taking differently: Inhale 3 L into the lungs. Portable oxygen concentrator. Diagnosis COPD. 12/05/18   [name], MD  Multiple Vitamin (MULTIVITAMIN WITH MINERALS) TABS tablet Take 1 tablet by mouth daily. Patient not taking: Reported on 11/18/2020 11/16/20   Esaw Grandchild A, DO  XARELTO 20 MG TABS tablet Take 20 mg by mouth daily. Patient not taking: Reported on 11/18/2020 10/18/20   [provider]  TUDORZA PRESSAIR 400 MCG/ACT AEPB INHALE 1 PUFF INTO THE LUNGS IN THE MORNING AND AT BEDTIME. 03/29/20 04/04/20  Nyoka Cowden, MD    Current Facility-Administered Medications  Medication Dose Route Frequency Provider Last Rate Last Admin   lactated ringers infusion   Intravenous Continuous Long, Arlyss Repress, MD 125 mL/hr at 11/18/20 0730 New Bag at 11/18/20 0730   Current Outpatient Medications  Medication Sig Dispense Refill   albuterol (PROVENTIL) (2.5 MG/3ML) 0.083% nebulizer solution Take 3 mLs (2.5 mg total) by nebulization every 4 (four) hours as needed for wheezing or shortness of breath. 75 mL 0   albuterol (VENTOLIN HFA) 108 (90 Base) MCG/ACT inhaler INHALE 2 PUFFS INTO THE LUNGS EVERY 6 (SIX) HOURS AS NEEDED FOR WHEEZING OR SHORTNESS OF BREATH. 18 g 5   atorvastatin (LIPITOR) 80 MG tablet TAKE 1 TABLET EVERY DAY (Patient taking differently: Take 80 mg by mouth daily.) 90 tablet 1   Budeson-Glycopyrrol-Formoterol 160-9-4.8 MCG/ACT AERO INHALE 2 PUFFS  INTO THE LUNGS 2 (TWO) TIMES DAILY. 10.7 g 11   doxycycline (VIBRA-TABS) 100 MG tablet Take 1 tablet (100 mg total) by mouth every 12 (twelve) hours for 14 days. 28 tablet 0   ferrous sulfate 325 (65 FE) MG tablet Take 1 tablet (325 mg total) by mouth 2 (two) times daily with a  meal. 60 tablet 1   furosemide (LASIX) 40 MG tablet TAKE 1 TABLET EVERY DAY (Patient taking differently: Take 40 mg by mouth daily.) 90 tablet 1   gabapentin (NEURONTIN) 300 MG capsule TAKE 1 CAPSULE (300 MG TOTAL) BY MOUTH AT BEDTIME. (Patient taking differently: Take 300 mg by mouth at bedtime.) 30 capsule 6   metroNIDAZOLE (FLAGYL) 250 MG tablet Take 1 tablet (250 mg total) by mouth every 12 (twelve) hours for 14 days. 28 tablet 0   nitroGLYCERIN (NITROSTAT) 0.4 MG SL tablet PLACE 1 TABLET (0.4 MG TOTAL) UNDER THE TONGUE EVERY 5 (FIVE) MINUTES AS NEEDED FOR CHEST PAIN. CALL 911 IF SYMPTOMS DO NOT RESOLVE AFTER 3 MINUTES (Patient taking differently: Place 0.4 mg under the tongue every 5 (five) minutes as needed for chest pain.) 30 tablet 1   pantoprazole (PROTONIX) 40 MG tablet Take 1 tablet (40 mg total) by mouth 2 (two) times daily. 30 tablet 1   bismuth subsalicylate (PEPTO BISMOL) 262 MG chewable tablet Chew 2 tablets (524 mg total) by mouth 4 (four) times daily. (Patient not taking: Reported on 11/18/2020) 30 tablet 0   Misc. Devices MISC Portable oxygen concentrator. Diagnosis COPD. (Patient taking differently: Inhale 3 L into the lungs. Portable oxygen concentrator. Diagnosis COPD.) 1 each 0   Multiple Vitamin (MULTIVITAMIN WITH MINERALS) TABS tablet Take 1 tablet by mouth daily. (Patient not taking: Reported on 11/18/2020)     XARELTO 20 MG TABS tablet Take 20 mg by mouth daily. (Patient not taking: Reported on 11/18/2020)      Allergies as of 11/17/2020 - Review Complete 11/17/2020  Allergen Reaction Noted   Lisinopril Cough 06/29/2014     Review of Systems:    Constitutional: No weight loss, fever or  chills Skin: No rash  Cardiovascular: No chest pain  Respiratory: No SOB  Gastrointestinal: See HPI and otherwise negative Genitourinary: No dysuria  Neurological: No headache, dizziness or syncope Musculoskeletal: No new muscle or joint pain Hematologic: No bleeding Psychiatric: No history of depression or anxiety    Physical Exam:  Vital signs in last 24 hours: Temp:  [97.8 F (36.6 C)] 97.8 F (36.6 C) (06/09 2334) Pulse Rate:  [89-103] 92 (06/10 0720) Resp:  [16-22] 21 (06/10 0720) BP: (83-121)/(52-79) 121/79 (06/10 0720) SpO2:  [100 %] 100 % (06/10 0720) Weight:  [98.4 kg] 98.4 kg (06/09 2334)   General:   Pleasant AA male appears to be in NAD, Well developed, Well nourished, alert and cooperative Head:  Normocephalic and atraumatic. Eyes:   PEERL, EOMI. No icterus. Conjunctiva pink. Ears:  Normal auditory acuity. Neck:  Supple Throat: Oral cavity and pharynx without inflammation, swelling or lesion.  Lungs: Respirations even and unlabored. Lungs clear to auscultation bilaterally.   No wheezes, crackles, or rhonchi.  Heart: Normal S1, S2. No MRG. Regular rate and rhythm. No peripheral edema, cyanosis or pallor.  Abdomen:  Soft, nondistended, nontender. No rebound or guarding. Normal bowel sounds. No appreciable masses or hepatomegaly. Rectal:  Not performed.  Msk:  Symmetrical without gross deformities. Peripheral pulses intact.  Extremities:  Without edema, no deformity or joint abnormality. Normal ROM, normal sensation. Neurologic:  Alert and  oriented x4;  grossly normal neurologically. Skin:   Dry and intact without significant lesions or rashes. Psychiatric: Demonstrates good judgement and reason without abnormal affect or behaviors.   LAB RESULTS: Recent Labs    11/15/20 1306 11/18/20 0048  WBC  --  18.3*  HGB 7.7* 7.1*  HCT 28.7* 27.3*  PLT  --  205   BMET Recent Labs    11/18/20 0048  NA 140  K 4.0  CL 102  CO2 26  GLUCOSE 199*  BUN 29*   CREATININE 2.13*  CALCIUM 8.2*   LFT Recent Labs    11/18/20 0048  PROT 5.9*  ALBUMIN 3.1*  AST 23  ALT 21  ALKPHOS 49  BILITOT 0.6   PT/INR Recent Labs    11/18/20 0048  LABPROT 15.0  INR 1.2    STUDIES: CT ABDOMEN PELVIS WO CONTRAST  Result Date: 11/18/2020 CLINICAL DATA:  Nonlocalized acute abdominal pain. abdominal pain, distension, dark stools, recent discharge for same, wbc's 18, cr 2.13 was 1.00 on 11/15/20, GFR 33, negative COVID 11/17/20, hx of HTN and peri rectal abscess. Recent gastric endoscopy procedure few days ago. EXAM: CT ABDOMEN AND PELVIS WITHOUT CONTRAST TECHNIQUE: Multidetector CT imaging of the abdomen and pelvis was performed following the standard protocol without IV contrast. COMPARISON:  CT abdomen pelvis 08/18/2020 FINDINGS: Lower chest: Emphysematous changes. Bilateral lower lobe atelectasis. Hepatobiliary: No focal liver abnormality. The gallbladder is contracted. No gallstones, gallbladder wall thickening, or pericholecystic fluid. No biliary dilatation. Pancreas: No focal lesion. Normal pancreatic contour. No surrounding inflammatory changes. No main pancreatic ductal dilatation. Spleen: Normal in size without focal abnormality. Adrenals/Urinary Tract: No adrenal nodule bilaterally. Vascular calcifications versus punctate nephrolithiasis within the left kidney (2:23). Otherwise no definite nephrolithiasis, no hydronephrosis, and no contour-deforming renal mass. No ureterolithiasis or hydroureter. The urinary bladder is unremarkable. Stomach/Bowel: Total of four linear metallic densities measuring up to approximately 1.5 cm along the lesser curvature of the gastric lumen (2:14, 5:19) consistent with endoluminal clips/gastric procedure. Ingested material noted within the gastric lumen. Stomach is within normal limits. No evidence of bowel wall thickening or dilatation. No pneumatosis. Appendix appears normal. No findings of perirectal abscess. Vascular/Lymphatic:  No abdominal aorta or iliac aneurysm. Severe atherosclerotic plaque of the aorta and its branches. No abdominal, pelvic, or inguinal lymphadenopathy. Reproductive: Prostate is unremarkable. Other: No intraperitoneal free fluid. No intraperitoneal free gas. No organized fluid collection. Musculoskeletal: No abdominal wall hernia or abnormality. No suspicious lytic or blastic osseous lesions. No acute displaced fracture. Multilevel degenerative changes of the spine with L5-S1 intervertebral disc space vacuum phenomenon and endplate sclerosis. IMPRESSION: 1. No acute intra-abdominal or intrapelvic abnormality with limited evaluation on this noncontrast study. No definite bowel perforation in a patient status post recent endoscopy. 2.  Aortic Atherosclerosis (ICD10-I70.0) - severe. 3.  Emphysema (ICD10-J43.9). These results were called by telephone at the time of interpretation on 11/18/2020 at 4:23 am to provider JOSHUA LONG , who verbally acknowledged these results. Electronically Signed   By: Tish Frederickson M.D.   On: 11/18/2020 04:24   DG Chest Portable 1 View  Result Date: 11/18/2020 CLINICAL DATA:  Initial evaluation for acute shortness of breath. EXAM: PORTABLE CHEST 1 VIEW COMPARISON:  Prior radiograph from 11/01/2020 FINDINGS: Cardiomegaly, stable. Mediastinal silhouette within normal limits. Aortic atherosclerosis. Lungs no well inflated. No pulmonary edema or visible pleural effusion. No focal infiltrates. No pneumothorax. No acute osseous finding. Multiple remotely healed left-sided rib fractures noted. IMPRESSION: 1. No active cardiopulmonary disease. 2. Stable cardiomegaly without pulmonary edema. 3.  Aortic Atherosclerosis (ICD10-I70.0). Electronically Signed   By: Rise Mu M.D.   On: 11/18/2020 01:51      Impression / Plan:   Impression: 1.  Abdominal pain: Describes epigastric gnawing pain at 11/17/2018 2 PM, none since time of admission yesterday, tells me he  has not been seeing any  dark stool or vomiting any blood, he just became weak and diaphoretic and called the EMS, found to be hypotensive at time of arrival; pain likely from known gastric ulcer disease/gastritis 2.  Acute on chronic anemia:hemoglobin 7.1 (7.3 on 6/6--> 7.4 on 6/7--> 7.7 on 6/7), not truly certain that this is a new GI bleed 3.  A. fib on Xarelto: Patient tells me he never restarted Xarelto after discharge 4.  COPD on home O2  Plan: 1.  Agree with continuing Protonix 40 mg IV twice daily as well as Flagyl and Doxycycline for treatment of recently found H. pylori infection. 2.  Continue to monitor hemoglobin with transfusion as needed less than 7, would recommend ordering 1 unit today 3.  Will discuss further with Dr. Rhea Belton, but there are no acute signs of GI bleed other than some hypotension and minimal drop in hemoglobin, if we do another EGD would recommend this to be done tomorrow morning.  We will allow the patient clear liquid diet today and n.p.o. at midnight. 4.  Please await any further recommendations from Dr. Rhea Belton later today.  Thank you for your kind consultation, we will continue to follow.  Violet Baldy [facility]  11/18/2020, 9:42 AM

## 2020-11-18 NOTE — ED Provider Notes (Signed)
Emergency Department Provider Note   I have reviewed the triage vital signs and the nursing notes.   HISTORY  Chief Complaint Abdominal Pain   HPI Jared [name] is a 65 y.o. male with past medical history of chronic anemia from GI bleeding, small bowel AVMs, COPD, and NICM returns to the emergency department with shortness of breath, abdominal discomfort, and diaphoresis.  Patient was discharged from the hospital on 11/15/20.  He was presumed to have upper GI bleed with likely Mallory-Weiss tear.  Patient had an upper endoscopy showing 2 small nonbleeding gastric ulcers.  No lesions in the esophagus.  His hematemesis resolved and hemoglobin remained stable not requiring transfusion.  Biopsies of his gastritis showed H. pylori and he began treatment.   Patient states he returned home and has had 1 bowel movement since returning home and that it was dark.  Denies bright red blood.  No fevers or chills.  He felt more short of breath this evening.  He is on 3 L nasal cannula at baseline.  He is not having associated chest discomfort but describes crampy, abdominal discomfort throughout his abdomen.  Not feeling particularly uncomfortable in any one area.    Past Medical History:  Diagnosis Date   AVM (arteriovenous malformation)    CAD (coronary artery disease)    a. LHC 5/12:  LAD 20, pLCx 20, pRCA 40, dRCA 40, EF 35%, diff HK  //  b. Myoview 4/16: Overall Impression:  High risk stress nuclear study There is no evidence of ischemia.  There is severe LV dysfunction. LV Ejection Fraction: 30%.  LV Wall Motion:  There is global LV hypokinesis.     CAP (community acquired pneumonia) 09/2013   Chronic combined systolic and diastolic CHF (congestive heart failure) (HCC)    a. Echo 4/16:Mild LVH, EF 40-45%, diffuse HK //  b. Echo 8/17: EF 35-40%, diffuse HK, diastolic dysfunction, aortic sclerosis, trivial MR, moderate LAE, normal RVSF, moderate RAE, mild TR, PASP 42 mmHg // c. Echo 4/18: Mild  concentric LVH, EF 30-35, normal wall motion, grade 1 diastolic dysfunction, PASP 49   Chronic respiratory failure (HCC)    Cluster headache    "hx; haven't had one in awhile" (01/09/2016)   COPD (chronic obstructive pulmonary disease) (HCC)    [name] 01/09/2016   History of CVA (cerebrovascular accident)    Hypertension    IDA (iron deficiency anemia)    Moderate tobacco use disorder    NICM (nonischemic cardiomyopathy) (HCC)    Nicotine addiction    Tobacco abuse     Patient Active Problem List   Diagnosis Date Noted   AKI (acute kidney injury) (HCC) 11/18/2020   Epigastric pain    Acute gastric ulcer with hemorrhage    Acute blood loss anemia    Acute upper GI bleed 11/12/2020   Hematemesis with nausea    Acute GI bleeding 06/11/2019   Syncope 06/11/2019   Chronic right maxillary sinusitis 01/29/2019   Chronic respiratory failure with hypoxia and hypercapnia (HCC) 01/01/2019   Acute on chronic respiratory failure with hypoxia and hypercapnia (HCC) 11/10/2018   GI bleed 10/27/2018   Chronic anticoagulation 09/27/2018   AV malformation of gastrointestinal tract    Occult GI bleeding    Heme positive stool    Iron deficiency anemia    Acute on chronic anemia 09/04/2018   Noncompliance 06/14/2017   Elevated LFTs 08/13/2016   Chronic respiratory failure with hypoxia (HCC) 07/24/2016   Atrial flutter (HCC)  Hepatic congestion 07/02/2016   Chronic combined systolic and diastolic CHF (congestive heart failure) (HCC) 07/01/2016   COPD with acute exacerbation (HCC) 06/03/2016   Prediabetes 05/14/2016   Hemoptysis 05/06/2016   COPD GOLD III/still smoking  02/29/2016   Cigarette smoker 01/09/2016   CAD (coronary artery disease) 10/07/2013   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   Centrilobular emphysema (HCC) 10/04/2013   Essential hypertension     Past Surgical History:  Procedure Laterality Date   BIOPSY  11/12/2020   Procedure: BIOPSY;  Surgeon:  Lemar Lofty., MD;  Location: [facility] ENDOSCOPY;  Service: Gastroenterology;;   CARDIAC CATHETERIZATION  10/2010   LM normal, LAD with 20% irregularities, LCX with 20%, RCA with 40% prox and 40% distal - EF of 35%   CATARACT EXTRACTION, BILATERAL     COLONOSCOPY W/ BIOPSIES AND POLYPECTOMY     COLONOSCOPY WITH PROPOFOL N/A 09/06/2018   Procedure: COLONOSCOPY WITH PROPOFOL;  Surgeon: Tressia Danas, MD;  Location: [facility] ENDOSCOPY;  Service: Gastroenterology;  Laterality: N/A;   ENTEROSCOPY N/A 09/28/2018   Procedure: ENTEROSCOPY;  Surgeon: Jeani Hawking, MD;  Location: [facility] ENDOSCOPY;  Service: Endoscopy;  Laterality: N/A;   ENTEROSCOPY N/A 10/28/2018   Procedure: ENTEROSCOPY;  Surgeon: Tressia Danas, MD;  Location: [facility] ENDOSCOPY;  Service: Gastroenterology;  Laterality: N/A;   ENTEROSCOPY N/A 10/09/2020   Procedure: ENTEROSCOPY;  Surgeon: Hilarie Fredrickson, MD;  Location: [facility] ENDOSCOPY;  Service: Endoscopy;  Laterality: N/A;   ESOPHAGOGASTRODUODENOSCOPY N/A 11/12/2020   Procedure: ESOPHAGOGASTRODUODENOSCOPY (EGD);  Surgeon: Lemar Lofty., MD;  Location: [facility] ENDOSCOPY;  Service: Gastroenterology;  Laterality: N/A;   ESOPHAGOGASTRODUODENOSCOPY (EGD) WITH PROPOFOL N/A 09/05/2018   Procedure: ESOPHAGOGASTRODUODENOSCOPY (EGD) WITH PROPOFOL;  Surgeon: Benancio Deeds, MD;  Location: [facility] ENDOSCOPY;  Service: Gastroenterology;  Laterality: N/A;   EXCISION MASS HEAD     GIVENS CAPSULE STUDY N/A 09/06/2018   Procedure: GIVENS CAPSULE STUDY;  Surgeon: Tressia Danas, MD;  Location: [facility] ENDOSCOPY;  Service: Gastroenterology;  Laterality: N/A;   GIVENS CAPSULE STUDY N/A 09/26/2018   Procedure: GIVENS CAPSULE STUDY;  Surgeon: Beverley Fiedler, MD;  Location: [facility] ENDOSCOPY;  Service: Gastroenterology;  Laterality: N/A;   GIVENS CAPSULE STUDY N/A 06/14/2019   Procedure: GIVENS CAPSULE STUDY;  Surgeon: Napoleon Form, MD;  Location: [facility] ENDOSCOPY;  Service: Endoscopy;  Laterality: N/A;   HEMOSTASIS CLIP  PLACEMENT  11/12/2020   Procedure: HEMOSTASIS CLIP PLACEMENT;  Surgeon: Lemar Lofty., MD;  Location: [facility] ENDOSCOPY;  Service: Gastroenterology;;   HEMOSTASIS CONTROL  11/12/2020   Procedure: HEMOSTASIS CONTROL;  Surgeon: Lemar Lofty., MD;  Location: [facility] ENDOSCOPY;  Service: Gastroenterology;;   HOT HEMOSTASIS N/A 10/28/2018   Procedure: HOT HEMOSTASIS (ARGON PLASMA COAGULATION/BICAP);  Surgeon: Tressia Danas, MD;  Location: [facility] ENDOSCOPY;  Service: Gastroenterology;  Laterality: N/A;   HOT HEMOSTASIS N/A 11/12/2020   Procedure: HOT HEMOSTASIS (ARGON PLASMA COAGULATION/BICAP);  Surgeon: Lemar Lofty., MD;  Location: [facility] ENDOSCOPY;  Service: Gastroenterology;  Laterality: N/A;   INCISION AND DRAINAGE PERIRECTAL ABSCESS N/A 06/05/2017   Procedure: IRRIGATION AND DEBRIDEMENT PERIRECTAL ABSCESS;  Surgeon: Andria Meuse, MD;  Location: [facility] OR;  Service: General;  Laterality: N/A;   SUBMUCOSAL TATTOO INJECTION  11/12/2020   Procedure: SUBMUCOSAL TATTOO INJECTION;  Surgeon: Lemar Lofty., MD;  Location: [facility] ENDOSCOPY;  Service: Gastroenterology;;   VIDEO BRONCHOSCOPY Bilateral 05/08/2016   Procedure: VIDEO BRONCHOSCOPY WITH FLUORO;  Surgeon: Oretha Milch, MD;  Location: [facility] ENDOSCOPY;  Service: Cardiopulmonary;  Laterality: Bilateral;    Allergies  Lisinopril  Family History  Problem Relation Age of Onset   Heart disease Mother    Diabetes Mother    Colon cancer Mother    Liver cancer Mother    Cancer Father        type unknown   Diabetes Sister        x 2   Diabetes Brother     Social History Social History   Tobacco Use   Smoking status: Some Days    Packs/day: 2.00    Years: 47.00    Pack years: 94.00    Types: Cigarettes   Smokeless tobacco: Never  Vaping Use   Vaping Use: Never used  Substance Use Topics   Alcohol use: Not Currently    Alcohol/week: 0.0 standard drinks    Comment: last drink was before xmas   Drug use: No    Types:  Cocaine, Marijuana    Comment: "nothing in 20 years"    Review of Systems  Constitutional: No fever/chills Eyes: No visual changes. ENT: No sore throat. Cardiovascular: Denies chest pain. Respiratory: Positive shortness of breath. Gastrointestinal: Positive abdominal pain.  No nausea, no vomiting.  No diarrhea.  No constipation. Positive dark stools.  Genitourinary: Negative for dysuria. Musculoskeletal: Negative for back pain. Skin: Negative for rash. Neurological: Negative for headaches, focal weakness or numbness.  10-point ROS otherwise negative.  ____________________________________________   PHYSICAL EXAM:  VITAL SIGNS: ED Triage Vitals  Enc Vitals Group     BP 11/17/20 2334 (!) 83/52     Pulse Rate 11/17/20 2334 (!) 102     Resp 11/17/20 2334 17     Temp 11/17/20 2334 97.8 F (36.6 C)     Temp Source 11/17/20 2334 Oral     SpO2 11/17/20 2334 100 %     Weight 11/17/20 2334 217 lb (98.4 kg)     Height 11/17/20 2334 6\' 3"  (1.905 m)    Constitutional: Alert and oriented. Well appearing and in no acute distress. Eyes: Conjunctivae are normal.  Head: Atraumatic. Nose: No congestion/rhinnorhea. Mouth/Throat: Mucous membranes are moist.  Neck: No stridor.   Cardiovascular: Normal rate, regular rhythm. Good peripheral circulation. Grossly normal heart sounds.   Respiratory: Slight increased respiratory effort.  No retractions. Lungs CTAB. Gastrointestinal: Soft with mild tenderness. Mild distention.  Musculoskeletal: No gross deformities of extremities. Neurologic:  Normal speech and language.  Skin:  Skin is warm, dry and intact. No rash noted.   ____________________________________________   LABS (all labs ordered are listed, but only abnormal results are displayed)  Labs Reviewed  COMPREHENSIVE METABOLIC PANEL - Abnormal; Notable for the following components:      Result Value   Glucose, Bld 199 (*)    BUN 29 (*)    Creatinine, Ser 2.13 (*)    Calcium  8.2 (*)    Total Protein 5.9 (*)    Albumin 3.1 (*)    GFR, Estimated 33 (*)    All other components within normal limits  CBC WITH DIFFERENTIAL/PLATELET - Abnormal; Notable for the following components:   WBC 18.3 (*)    RBC 3.04 (*)    Hemoglobin 7.1 (*)    HCT 27.3 (*)    MCH 23.4 (*)    MCHC 26.0 (*)    RDW 19.9 (*)    nRBC 1.0 (*)    Neutro Abs 15.1 (*)    Monocytes Absolute 1.6 (*)    Abs Immature Granulocytes 0.18 (*)    All other components within normal  limits  BASIC METABOLIC PANEL - Abnormal; Notable for the following components:   Glucose, Bld 113 (*)    BUN 37 (*)    Calcium 7.9 (*)    All other components within normal limits  CBC - Abnormal; Notable for the following components:   WBC 11.4 (*)    RBC 2.51 (*)    Hemoglobin 5.9 (*)    HCT 22.4 (*)    MCH 23.5 (*)    MCHC 26.3 (*)    RDW 19.9 (*)    nRBC 0.4 (*)    All other components within normal limits  DIFFERENTIAL - Abnormal; Notable for the following components:   Neutro Abs 8.2 (*)    Monocytes Absolute 1.2 (*)    All other components within normal limits  COMPREHENSIVE METABOLIC PANEL - Abnormal; Notable for the following components:   Glucose, Bld 113 (*)    BUN 26 (*)    Calcium 8.0 (*)    Total Protein 5.3 (*)    Albumin 2.9 (*)    All other components within normal limits  CBC - Abnormal; Notable for the following components:   RBC 3.06 (*)    Hemoglobin 7.6 (*)    HCT 27.9 (*)    MCH 24.8 (*)    MCHC 27.2 (*)    RDW 17.9 (*)    nRBC 0.6 (*)    All other components within normal limits  HEMOGLOBIN AND HEMATOCRIT, BLOOD - Abnormal; Notable for the following components:   Hemoglobin 6.7 (*)    HCT 24.8 (*)    All other components within normal limits  RESP PANEL BY RT-PCR (FLU A&B, COVID) ARPGX2  LIPASE, BLOOD  PROTIME-INR  CBC WITH DIFFERENTIAL/PLATELET  HEMOGLOBIN AND HEMATOCRIT, BLOOD  POC OCCULT BLOOD, ED  TYPE AND SCREEN  PREPARE RBC (CROSSMATCH)    ____________________________________________  EKG  None  ____________________________________________  RADIOLOGY  CT reviewed.   ____________________________________________   PROCEDURES  Procedure(s) performed:   Procedures  CRITICAL CARE Performed by: Maia Plan Total critical care time: 35 minutes Critical care time was exclusive of separately billable procedures and treating other patients. Critical care was necessary to treat or prevent imminent or life-threatening deterioration. Critical care was time spent personally by me on the following activities: development of treatment plan with patient and/or surrogate as well as nursing, discussions with consultants, evaluation of patient's response to treatment, examination of patient, obtaining history from patient or surrogate, ordering and performing treatments and interventions, ordering and review of laboratory studies, ordering and review of radiographic studies, pulse oximetry and re-evaluation of patient's condition.  Alona Bene, MD Emergency Medicine  ____________________________________________   INITIAL IMPRESSION / ASSESSMENT AND PLAN / ED COURSE  Pertinent labs & imaging results that were available during my care of the patient were reviewed by me and considered in my medical decision making (see chart for details).   Patient recently admitted with upper GI bleeding and anemia not requiring transfusion presents with shortness of breath along with some crampy abdominal pain.  No focal tenderness on exam to suspect perforated ulcer.  Certainly no peritoneal findings on exam.  Has some mild increased work of breathing.  Unclear if the patient may have developed a viral process, COPD exacerbation, symptomatic anemia.  Doubt atypical ACS.  PE is lower on my differential as he has been anticoagulated until his recent hospitalization.  He has not restarted his anticoagulation since discharge as it was being held in  the setting of GI  bleeding.   Melena on exam here. Hb stable. CT abdomen/pelvis reviewed. Patient does have an AKI. Plan for admit for AKI and GI consultation.  ____________________________________________  FINAL CLINICAL IMPRESSION(S) / ED DIAGNOSES  Final diagnoses:  Generalized abdominal pain  AKI (acute kidney injury) (HCC)     MEDICATIONS GIVEN DURING THIS VISIT:  Medications  lactated ringers infusion (0 mLs Intravenous Paused 11/18/20 1554)  ondansetron (ZOFRAN) tablet 4 mg (has no administration in time range)    Or  ondansetron (ZOFRAN) injection 4 mg (has no administration in time range)  metroNIDAZOLE (FLAGYL) IVPB 500 mg ( Intravenous Infusion Verify 11/19/20 0415)  doxycycline (VIBRAMYCIN) 100 mg in sodium chloride 0.9 % 250 mL IVPB (0 mg Intravenous Stopped 11/19/20 0058)  albuterol (PROVENTIL) (2.5 MG/3ML) 0.083% nebulizer solution 2.5 mg (has no administration in time range)  pantoprazole (PROTONIX) injection 40 mg (40 mg Intravenous Given 11/18/20 2136)  0.9 %  sodium chloride infusion ( Intravenous Duplicate 11/18/20 1830)  mometasone-formoterol (DULERA) 100-5 MCG/ACT inhaler 2 puff (has no administration in time range)    And  umeclidinium bromide (INCRUSE ELLIPTA) 62.5 MCG/INH 1 puff (has no administration in time range)  sodium chloride 0.9 % bolus 1,000 mL (0 mLs Intravenous Stopped 11/18/20 0702)  pantoprazole (PROTONIX) 80 mg in sodium chloride 0.9 % 100 mL IVPB (0 mg Intravenous Stopped 11/18/20 0702)  0.9 %  sodium chloride infusion (Manually program via Guardrails IV Fluids) ( Intravenous Duplicate 11/18/20 1600)      Note:  This document was prepared using Dragon voice recognition software and may include unintentional dictation errors.  Alona BeneJoshua Avalie Oconnor, MD, [facility] Emergency Medicine    Jem Castro, Arlyss RepressJoshua G, MD 11/19/20 2308

## 2020-11-19 ENCOUNTER — Encounter (HOSPITAL_COMMUNITY)
Admission: EM | Disposition: A | Discharge status: Home / Self Care | Payer: Self-pay | Source: Home / Self Care | Attending: Emergency Medicine

## 2020-11-19 ENCOUNTER — Observation Stay (HOSPITAL_COMMUNITY): Payer: Medicare HMO | Admitting: Anesthesiology

## 2020-11-19 ENCOUNTER — Encounter (HOSPITAL_COMMUNITY): Payer: Self-pay | Admitting: Family Medicine

## 2020-11-19 DIAGNOSIS — T39395A Adverse effect of other nonsteroidal anti-inflammatory drugs [NSAID], initial encounter: Secondary | ICD-10-CM

## 2020-11-19 DIAGNOSIS — Z20822 Contact with and (suspected) exposure to covid-19: Secondary | ICD-10-CM | POA: Diagnosis not present

## 2020-11-19 DIAGNOSIS — I480 Paroxysmal atrial fibrillation: Secondary | ICD-10-CM | POA: Diagnosis not present

## 2020-11-19 DIAGNOSIS — T182XXA Foreign body in stomach, initial encounter: Secondary | ICD-10-CM | POA: Diagnosis not present

## 2020-11-19 DIAGNOSIS — K250 Acute gastric ulcer with hemorrhage: Secondary | ICD-10-CM | POA: Diagnosis not present

## 2020-11-19 DIAGNOSIS — B9681 Helicobacter pylori [H. pylori] as the cause of diseases classified elsewhere: Secondary | ICD-10-CM

## 2020-11-19 DIAGNOSIS — I110 Hypertensive heart disease with heart failure: Secondary | ICD-10-CM | POA: Diagnosis not present

## 2020-11-19 DIAGNOSIS — N179 Acute kidney failure, unspecified: Secondary | ICD-10-CM | POA: Diagnosis not present

## 2020-11-19 DIAGNOSIS — K253 Acute gastric ulcer without hemorrhage or perforation: Secondary | ICD-10-CM | POA: Diagnosis not present

## 2020-11-19 DIAGNOSIS — Z8719 Personal history of other diseases of the digestive system: Secondary | ICD-10-CM

## 2020-11-19 DIAGNOSIS — Z9981 Dependence on supplemental oxygen: Secondary | ICD-10-CM

## 2020-11-19 DIAGNOSIS — K259 Gastric ulcer, unspecified as acute or chronic, without hemorrhage or perforation: Secondary | ICD-10-CM | POA: Diagnosis not present

## 2020-11-19 DIAGNOSIS — I5042 Chronic combined systolic (congestive) and diastolic (congestive) heart failure: Secondary | ICD-10-CM | POA: Diagnosis not present

## 2020-11-19 DIAGNOSIS — K2289 Other specified disease of esophagus: Secondary | ICD-10-CM | POA: Diagnosis not present

## 2020-11-19 DIAGNOSIS — R58 Hemorrhage, not elsewhere classified: Secondary | ICD-10-CM | POA: Diagnosis not present

## 2020-11-19 DIAGNOSIS — D62 Acute posthemorrhagic anemia: Secondary | ICD-10-CM | POA: Diagnosis not present

## 2020-11-19 DIAGNOSIS — J449 Chronic obstructive pulmonary disease, unspecified: Secondary | ICD-10-CM | POA: Diagnosis not present

## 2020-11-19 DIAGNOSIS — Z8673 Personal history of transient ischemic attack (TIA), and cerebral infarction without residual deficits: Secondary | ICD-10-CM | POA: Diagnosis not present

## 2020-11-19 DIAGNOSIS — I2510 Atherosclerotic heart disease of native coronary artery without angina pectoris: Secondary | ICD-10-CM | POA: Diagnosis not present

## 2020-11-19 DIAGNOSIS — Z743 Need for continuous supervision: Secondary | ICD-10-CM | POA: Diagnosis not present

## 2020-11-19 DIAGNOSIS — I4892 Unspecified atrial flutter: Secondary | ICD-10-CM | POA: Diagnosis not present

## 2020-11-19 DIAGNOSIS — R1013 Epigastric pain: Secondary | ICD-10-CM | POA: Diagnosis not present

## 2020-11-19 HISTORY — PX: ESOPHAGOGASTRODUODENOSCOPY (EGD) WITH PROPOFOL: SHX5813

## 2020-11-19 LAB — URINALYSIS, ROUTINE W REFLEX MICROSCOPIC
Bacteria, UA: NONE SEEN
Bilirubin Urine: NEGATIVE
Glucose, UA: >=500 mg/dL — AB
Hgb urine dipstick: NEGATIVE
Ketones, ur: NEGATIVE mg/dL
Leukocytes,Ua: NEGATIVE
Nitrite: NEGATIVE
Protein, ur: NEGATIVE mg/dL
Specific Gravity, Urine: 1.011 (ref 1.005–1.030)
pH: 6 (ref 5.0–8.0)

## 2020-11-19 LAB — COMPREHENSIVE METABOLIC PANEL
ALT: 16 U/L (ref 0–44)
AST: 19 U/L (ref 15–41)
Albumin: 2.9 g/dL — ABNORMAL LOW (ref 3.5–5.0)
Alkaline Phosphatase: 40 U/L (ref 38–126)
Anion gap: 5 (ref 5–15)
BUN: 26 mg/dL — ABNORMAL HIGH (ref 8–23)
CO2: 30 mmol/L (ref 22–32)
Calcium: 8 mg/dL — ABNORMAL LOW (ref 8.9–10.3)
Chloride: 104 mmol/L (ref 98–111)
Creatinine, Ser: 0.98 mg/dL (ref 0.61–1.24)
GFR, Estimated: >60 mL/min (ref >60)
Glucose, Bld: 113 mg/dL — ABNORMAL HIGH (ref 70–99)
Potassium: 4.3 mmol/L (ref 3.5–5.1)
Sodium: 139 mmol/L (ref 135–145)
Total Bilirubin: 0.3 mg/dL (ref 0.3–1.2)
Total Protein: 5.3 g/dL — ABNORMAL LOW (ref 6.5–8.1)

## 2020-11-19 LAB — HEMOGLOBIN AND HEMATOCRIT, BLOOD
HCT: 26.5 % — ABNORMAL LOW (ref 39.0–52.0)
Hemoglobin: 7.4 g/dL — ABNORMAL LOW (ref 13.0–17.0)

## 2020-11-19 LAB — CBC
HCT: 27.9 % — ABNORMAL LOW (ref 39.0–52.0)
Hemoglobin: 7.6 g/dL — ABNORMAL LOW (ref 13.0–17.0)
MCH: 24.8 pg — ABNORMAL LOW (ref 26.0–34.0)
MCHC: 27.2 g/dL — ABNORMAL LOW (ref 30.0–36.0)
MCV: 91.2 fL (ref 80.0–100.0)
Platelets: 167 10*3/uL (ref 150–400)
RBC: 3.06 MIL/uL — ABNORMAL LOW (ref 4.22–5.81)
RDW: 17.9 % — ABNORMAL HIGH (ref 11.5–15.5)
WBC: 9.4 10*3/uL (ref 4.0–10.5)
nRBC: 0.6 % — ABNORMAL HIGH (ref 0.0–0.2)

## 2020-11-19 LAB — BPAM RBC
Blood Product Expiration Date: 202207122359
Blood Product Expiration Date: 202207122359
ISSUE DATE / TIME: 202206101600
ISSUE DATE / TIME: 202206102122
Unit Type and Rh: 5100
Unit Type and Rh: 5100

## 2020-11-19 LAB — TYPE AND SCREEN
ABO/RH(D): O POS
Antibody Screen: NEGATIVE
Unit division: 0
Unit division: 0

## 2020-11-19 SURGERY — ESOPHAGOGASTRODUODENOSCOPY (EGD) WITH PROPOFOL
Anesthesia: Monitor Anesthesia Care

## 2020-11-19 MED ORDER — PROPOFOL 500 MG/50ML IV EMUL
INTRAVENOUS | Status: DC | PRN
  Administered 2020-11-19: 125 ug/kg/min via INTRAVENOUS

## 2020-11-19 MED ORDER — DEXAMETHASONE SODIUM PHOSPHATE 10 MG/ML IJ SOLN
INTRAMUSCULAR | Status: DC | PRN
  Administered 2020-11-19: 10 mg via INTRAVENOUS

## 2020-11-19 MED ORDER — NITROGLYCERIN 0.4 MG SL SUBL: 0.4000 mg | SUBLINGUAL_TABLET | SUBLINGUAL | Status: DC | PRN

## 2020-11-19 MED ORDER — PROPOFOL 10 MG/ML IV BOLUS
INTRAVENOUS | Status: DC | PRN
  Administered 2020-11-19 (×4): 20 mg via INTRAVENOUS

## 2020-11-19 MED ORDER — LIDOCAINE 2% (20 MG/ML) 5 ML SYRINGE
INTRAMUSCULAR | Status: DC | PRN
  Administered 2020-11-19: 100 mg via INTRAVENOUS

## 2020-11-19 MED ORDER — MISC. DEVICES MISC: 3.0000 L | Status: DC | PRN

## 2020-11-19 MED ORDER — GABAPENTIN 300 MG PO CAPS: 300.0000 mg | ORAL_CAPSULE | Freq: Every day | ORAL | Status: DC

## 2020-11-19 MED ORDER — PANTOPRAZOLE SODIUM 40 MG PO TBEC
40.0000 mg | DELAYED_RELEASE_TABLET | Freq: Two times a day (BID) | ORAL | Status: DC
  Administered 2020-11-19 (×2): 40 mg via ORAL
  Filled 2020-11-19: qty 1

## 2020-11-19 MED ORDER — PROPOFOL 500 MG/50ML IV EMUL
INTRAVENOUS | Status: AC
  Filled 2020-11-19: qty 50

## 2020-11-19 MED ORDER — ONDANSETRON HCL 4 MG/2ML IJ SOLN
INTRAMUSCULAR | Status: DC | PRN
  Administered 2020-11-19: 4 mg via INTRAVENOUS

## 2020-11-19 MED ORDER — ATORVASTATIN CALCIUM 80 MG PO TABS: 80.0000 mg | ORAL_TABLET | Freq: Every day | ORAL | Status: DC

## 2020-11-19 MED ORDER — FUROSEMIDE 40 MG PO TABS: 40.0000 mg | ORAL_TABLET | Freq: Every day | ORAL | Status: DC

## 2020-11-19 MED ORDER — LACTATED RINGERS IV SOLN
INTRAVENOUS | Status: AC | PRN
  Administered 2020-11-19: 10 mL/h via INTRAVENOUS

## 2020-11-19 MED ORDER — PROPOFOL 10 MG/ML IV BOLUS
INTRAVENOUS | Status: AC
  Filled 2020-11-19: qty 20

## 2020-11-19 SURGICAL SUPPLY — 14 items

## 2020-11-19 NOTE — Anesthesia Postprocedure Evaluation (Signed)
Anesthesia Post Note  Patient: Jared [name]  Procedure(s) Performed: ESOPHAGOGASTRODUODENOSCOPY (EGD) WITH PROPOFOL     Patient location during evaluation: Endoscopy Anesthesia Type: MAC Level of consciousness: awake Pain management: pain level controlled Vital Signs Assessment: post-procedure vital signs reviewed and stable Respiratory status: spontaneous breathing Cardiovascular status: stable Postop Assessment: no apparent nausea or vomiting Anesthetic complications: no   No notable events documented.  Last Vitals:  Vitals:   11/19/20 0904 11/19/20 0936  BP: (!) 142/72 (!) 103/53  Pulse: 79 83  Resp: 16 16  Temp: 37.1 C 36.8 C  SpO2: 100% 100%    Last Pain:  Vitals:   11/19/20 0936  TempSrc: Axillary  PainSc: 0-No pain                 Jakim Drapeau

## 2020-11-19 NOTE — Progress Notes (Signed)
Patient related that his portable oxygen tank at home is not filled and no one has access to his house nor know's how to fill his portable oxygen tank. Patient could not get ahold of his home oxygen company. Patient will be leaving this evening  via [facility].

## 2020-11-19 NOTE — Anesthesia Preprocedure Evaluation (Addendum)
Anesthesia Evaluation  Patient identified by MRN, date of birth, ID band Patient awake    Reviewed: Allergy & Precautions, NPO status , Patient's Chart, lab work & pertinent test results  Airway Mallampati: II  TM Distance: >3 FB     Dental   Pulmonary pneumonia, COPD, Current Smoker and Patient abstained from smoking.,    breath sounds clear to auscultation       Cardiovascular hypertension, + CAD and +CHF   Rhythm:Regular Rate:Normal     Neuro/Psych  Headaches,    GI/Hepatic Neg liver ROS, PUD,   Endo/Other  negative endocrine ROS  Renal/GU Renal disease     Musculoskeletal   Abdominal   Peds  Hematology  (+) anemia ,   Anesthesia Other Findings   Reproductive/Obstetrics                             Anesthesia Physical Anesthesia Plan  ASA: 3  Anesthesia Plan: MAC   Post-op Pain Management:    Induction: Intravenous  PONV Risk Score and Plan: 1 and Ondansetron and Dexamethasone  Airway Management Planned: Nasal Cannula and Simple Face Mask  Additional Equipment:   Intra-op Plan:   Post-operative Plan:   Informed Consent: I have reviewed the patients History and Physical, chart, labs and discussed the procedure including the risks, benefits and alternatives for the proposed anesthesia with the patient or authorized representative who has indicated his/her understanding and acceptance.     Dental advisory given  Plan Discussed with: CRNA and Anesthesiologist  Anesthesia Plan Comments:         Anesthesia Quick Evaluation

## 2020-11-19 NOTE — Progress Notes (Signed)
Patient will be discharging home with family this afternoon. Belongings were returned. Education on medications will be provided.

## 2020-11-19 NOTE — Progress Notes (Signed)
SATURATION QUALIFICATIONS: (This note is used to comply with regulatory documentation for home oxygen)  Patient Saturations on Room Air at Rest = 94%  Patient Saturations on Room Air while Ambulating = 77%  Patient Saturations on 4 Liters of oxygen while Ambulating = 90%  Please briefly explain why patient needs home oxygen: Patient desats with ambulation.

## 2020-11-19 NOTE — Op Note (Signed)
[facility] Patient Name: Jared [name] Procedure Date: 11/19/2020 MRN: [id number] Attending MD: Beverley Fiedler , MD Date of Birth: [birth year] CSN: [id number] Age: 65 Admit Type: Inpatient Procedure:                Upper GI endoscopy Indications:              Recent gastrointestinal bleeding due to gastric                            ulcer in the cardia/fundus (related to H. Pylori                            infection and NSAIDs), discharged and returning                            with roughly stable Hgb, but upper abdominal pain                            and hypotension Providers:                Carie Caddy. Rhea Belton, MD, Margaree Mackintosh, RN,                            Leanne Lovely, Technician, Leroy Libman, CRNA Referring MD:             [facility] Medicines:                Monitored Anesthesia Care Complications:            No immediate complications. Estimated Blood Loss:     Estimated blood loss: none. Procedure:                Pre-Anesthesia Assessment:                           - Prior to the procedure, a History and Physical                            was performed, and patient medications and                            allergies were reviewed. The patient's tolerance of                            previous anesthesia was also reviewed. The risks                            and benefits of the procedure and the sedation                            options and risks were discussed with the patient.                            All questions were answered, and informed consent  was obtained. Prior Anticoagulants: The patient has                            taken no previous anticoagulant or antiplatelet                            agents. ASA Grade Assessment: III - A patient with                            severe systemic disease. After reviewing the risks                            and benefits, the patient was deemed in                             satisfactory condition to undergo the procedure.                           After obtaining informed consent, the endoscope was                            passed under direct vision. Throughout the                            procedure, the patient's blood pressure, pulse, and                            oxygen saturations were monitored continuously. The                            GIF-H190 ([id number]) Olympus gastroscope was                            introduced through the mouth, and advanced to the                            second part of duodenum. The upper GI endoscopy was                            accomplished without difficulty. The patient                            tolerated the procedure well. Scope In: Scope Out: Findings:      The Z-line was irregular and was found 44 cm from the incisors.      There is no endoscopic evidence of bleeding in the entire examined       stomach.      3 previously placed endoclip was found in the gastric cardia/fundus.       Surrounding erythema and edema without definitive ulcer (clips have       closed previously seen ulcers) and no bleeding or stigmata of bleeding.      The exam of the stomach was otherwise normal.      The examined duodenum was normal. Impression:               -  Z-line irregular, 44 cm from the incisors.                           - Endoclips were found in the gastric cardia/fundus                            without evidence of rebleeding or stigmata or                            recent bleeding. Good treatment effective from                            endoclipping.                           - Normal examined duodenum.                           - No specimens collected. Moderate Sedation:      N/A Recommendation:           - Return patient to hospital ward for ongoing care.                            From GI perspective I expect he can be discharged                            soon.                            - Advance diet as tolerated.                           - Continue present medications including BID PO PPI                            and completion of H. Pylori treatment.                           - GI follow-up recommended and in place. Will                            discuss H. Pylori stool Ag in the future for                            confirmation of eradication. I do not think repeat                            EGD to document ulcer healing will be necessary                            based on today's exam (assuming no ongoing GI                            complaints to warrant repeat EGD).                           -  GI will sign off, call if questions. Procedure Code(s):        --- Professional ---                           [phone], Esophagogastroduodenoscopy, flexible,                            transoral; diagnostic, including collection of                            specimen(s) by brushing or washing, when performed                            (separate procedure) Diagnosis Code(s):        --- Professional ---                           R15.4MGQ, Foreign body in stomach, initial encounter                           K92.2, Gastrointestinal hemorrhage, unspecified CPT copyright 2019 [employer]. All rights reserved. The codes documented in this report are preliminary and upon coder review may  be revised to meet current compliance requirements. Beverley Fiedler, MD 11/19/2020 9:39:27 AM This report has been signed electronically. Number of Addenda: 0

## 2020-11-19 NOTE — Transfer of Care (Signed)
Immediate Anesthesia Transfer of Care Note  Patient: Jared [name]  Procedure(s) Performed: ESOPHAGOGASTRODUODENOSCOPY (EGD) WITH PROPOFOL  Patient Location: Endoscopy Unit  Anesthesia Type:MAC  Level of Consciousness: drowsy  Airway & Oxygen Therapy: Patient Spontanous Breathing and Patient connected to face mask oxygen  Post-op Assessment: Report given to RN and Post -op Vital signs reviewed and stable  Post vital signs: Reviewed and stable  Last Vitals:  Vitals Value Taken Time  BP    Temp    Pulse    Resp    SpO2      Last Pain:  Vitals:   11/19/20 0904  TempSrc: Oral  PainSc: 0-No pain         Complications: No notable events documented.

## 2020-11-19 NOTE — Discharge Summary (Addendum)
Discharge Summary  Jared [name] ZOX:[id number] DOB: 06/21/1955  PCP: Hoy RegisterNewlin, Enobong, MD  Admit date: 11/17/2020 Discharge date: 11/19/2020  Time spent: 30mins  Recommendations for Outpatient Follow-up:  F/u with PCP on 6/27 for hospital discharge follow up, repeat cbc/bmp at follow up F/u with GI on 7/13 F/u with cardiology F/u with pulmonology    Discharge Diagnoses:  Active Hospital Problems   Diagnosis Date Noted   History of GI bleed    Gastric ulcer due to Helicobacter pylori and nonsteroidal anti-inflammatory drug (NSAID)    AKI (acute kidney injury) (HCC) 11/18/2020    Resolved Hospital Problems  No resolved problems to display.    Discharge Condition: stable  Diet recommendation: heart healthy  Filed Weights   11/17/20 2334 11/19/20 0457 11/19/20 0904  Weight: 98.4 kg 102.4 kg 102.4 kg    History of present illness: ( per admitting provider Dr Ronaldo MiyamotoKyle)  Chief Complaint: stomach pain   HPI: Jared DageJohnny Callan is a 65 y.o. male with medical history significant of COPD, combined systolic/diastolic HF, paroxsymal a fib, CVA, GIB. Presents with stomach pain. Reports his symptoms started yesterday evening. It was a global, gnawing pain. It was 6-7/10 and constant. He didn't try any medications to help. He started feeling weak, short of breath, and sweaty; so he called EMS. He denies any other aggravating or alleviating factors.     ED Course: Found to be hypotensive. Given fluids and started on protonix. CXR was negative for acute change. CT ab/pelvis was negative. [facility] was called for admission.    Hospital Course:  Active Problems:   AKI (acute kidney injury) (HCC)   History of GI bleed   Gastric ulcer due to Helicobacter pylori and nonsteroidal anti-inflammatory drug (NSAID)   Abdominal pain ( presenting symptom)/acute on chronic anemia from gi blood loss -Recent H. Pylori infection, recent hospitalization and EGD with endoclips on 6/8 -CT ab /pel on acute  intra-abdominal/intrapelvic abnormality -S/p [id number]  -Repeat EGD today showed "Endoclips were found in the gastric cardia/fundus                           without evidence of rebleeding or stigmata or                           recent bleeding. Good treatment effective from                           endoclipping." -avoid NSAIDS  -continue protonix,  flagyl, doxy, Pepto-Bismol to finish H. pylori treatment, follow-up with GI repeat stool study to ensure eradication of H. Pylori -Continue iron supplement -Case discussed with GI Dr. Rhea BeltonPyrtle who okayed with resuming Xarelto       AKI -CT abdomen/pelvis no obstructive nephropathy -UA no sign of infection -AKI likely due to dehydration,/hypotension,  resolved after hydration, encourage oral intake -Follow-up with PCP  Leukocytosis/hypotension  From dehydration/stress, no signs of infection, chest x-ray unremarkable, UA unremarkable Resolved after hydration/prbc transfusion    Paroxysmal A fib -Sinus rhythm in the hospital -Does not appear to be on rate/rhythm control meds  -Case discussed with GI who recommended resume Xarelto  F/u with cardiology     Chronic combined systolic/diastolic HF     -  lasix held in the hospital for now d/t hypotension     - received ivf in the hospital     - euvolmic at  discharge, resume lasix tomorrow F/u with cardiology   HLD     - resume home statin when able   Hx of CVA     - resume home statin when able -reports no residual deficit   Aortic atherosclerosis, severe Incidental finding on CT abdomen/pelvis On statin   COPD, chronic hypoxic respiratory failure on home O2 3 L     -Stable ,continue home inhalers    Procedures: EGD Prbc x2units  Consultations: GI  Discharge Exam: BP 139/77 (BP Location: Left Arm)   Pulse 89   Temp 98.4 F (36.9 C) (Oral)   Resp 19   Ht 6\' 3"  (1.905 m)   Wt 102.4 kg   SpO2 98%   BMI 28.22 kg/m   General: NAD Cardiovascular:  RRR Respiratory: CTABL  Discharge Instructions You were cared for by a hospitalist during your hospital stay. If you have any questions about your discharge medications or the care you received while you were in the hospital after you are discharged, you can call the unit and asked to speak with the hospitalist on call if the hospitalist that took care of you is not available. Once you are discharged, your primary care physician will handle any further medical issues. Please note that NO REFILLS for any discharge medications will be authorized once you are discharged, as it is imperative that you return to your primary care physician (or establish a relationship with a primary care physician if you do not have one) for your aftercare needs so that they can reassess your need for medications and monitor your lab values.  Discharge Instructions     Diet - low sodium heart healthy   Complete by: As directed    Increase activity slowly   Complete by: As directed       Allergies as of 11/19/2020       Reactions   Lisinopril Cough        Medication List     TAKE these medications    albuterol 108 (90 Base) MCG/ACT inhaler Commonly known as: VENTOLIN HFA INHALE 2 PUFFS INTO THE LUNGS EVERY 6 (SIX) HOURS AS NEEDED FOR WHEEZING OR SHORTNESS OF BREATH.   albuterol (2.5 MG/3ML) 0.083% nebulizer solution Commonly known as: PROVENTIL Take 3 mLs (2.5 mg total) by nebulization every 4 (four) hours as needed for wheezing or shortness of breath.   atorvastatin 80 MG tablet Commonly known as: LIPITOR Take 1 tablet (80 mg total) by mouth daily.   bismuth subsalicylate 262 MG chewable tablet Commonly known as: PEPTO BISMOL Chew 2 tablets (524 mg total) by mouth 4 (four) times daily.   Breztri Aerosphere 160-9-4.8 MCG/ACT Aero Generic drug: Budeson-Glycopyrrol-Formoterol INHALE 2 PUFFS INTO THE LUNGS 2 (TWO) TIMES DAILY.   doxycycline 100 MG tablet Commonly known as: VIBRA-TABS Take 1  tablet (100 mg total) by mouth every 12 (twelve) hours for 14 days.   ferrous sulfate 325 (65 FE) MG tablet Take 1 tablet (325 mg total) by mouth 2 (two) times daily with a meal.   furosemide 40 MG tablet Commonly known as: LASIX Take 1 tablet (40 mg total) by mouth daily.   gabapentin 300 MG capsule Commonly known as: NEURONTIN Take 1 capsule (300 mg total) by mouth at bedtime.   metroNIDAZOLE 250 MG tablet Commonly known as: FLAGYL Take 1 tablet (250 mg total) by mouth every 12 (twelve) hours for 14 days.   Misc. Devices Misc Inhale 3 L into the lungs as needed. Portable oxygen concentrator.  Diagnosis COPD. What changed:  how much to take how to take this when to take this reasons to take this   multivitamin with minerals Tabs tablet Take 1 tablet by mouth daily.   nitroGLYCERIN 0.4 MG SL tablet Commonly known as: NITROSTAT Place 1 tablet (0.4 mg total) under the tongue every 5 (five) minutes as needed for chest pain.   pantoprazole 40 MG tablet Commonly known as: Protonix Take 1 tablet (40 mg total) by mouth 2 (two) times daily.   Xarelto 20 MG Tabs tablet Generic drug: rivaroxaban Take 20 mg by mouth daily.       Allergies  Allergen Reactions   Lisinopril Cough    Follow-up Information     [name], MD Follow up.   Specialty: Family Medicine Why: pcp to repeat basic labs to follow up on anemia and kidney function Contact information: [address] [place] [address] [phone]                  The results of significant diagnostics from this hospitalization (including imaging, microbiology, ancillary and laboratory) are listed below for reference.    Significant Diagnostic Studies: CT ABDOMEN PELVIS WO CONTRAST  Result Date: 11/18/2020 CLINICAL DATA:  Nonlocalized acute abdominal pain. abdominal pain, distension, dark stools, recent discharge for same, wbc's 18, cr 2.13 was 1.00 on 11/15/20, GFR 33, negative COVID 11/17/20, hx  of HTN and peri rectal abscess. Recent gastric endoscopy procedure few days ago. EXAM: CT ABDOMEN AND PELVIS WITHOUT CONTRAST TECHNIQUE: Multidetector CT imaging of the abdomen and pelvis was performed following the standard protocol without IV contrast. COMPARISON:  CT abdomen pelvis 08/18/2020 FINDINGS: Lower chest: Emphysematous changes. Bilateral lower lobe atelectasis. Hepatobiliary: No focal liver abnormality. The gallbladder is contracted. No gallstones, gallbladder wall thickening, or pericholecystic fluid. No biliary dilatation. Pancreas: No focal lesion. Normal pancreatic contour. No surrounding inflammatory changes. No main pancreatic ductal dilatation. Spleen: Normal in size without focal abnormality. Adrenals/Urinary Tract: No adrenal nodule bilaterally. Vascular calcifications versus punctate nephrolithiasis within the left kidney (2:23). Otherwise no definite nephrolithiasis, no hydronephrosis, and no contour-deforming renal mass. No ureterolithiasis or hydroureter. The urinary bladder is unremarkable. Stomach/Bowel: Total of four linear metallic densities measuring up to approximately 1.5 cm along the lesser curvature of the gastric lumen (2:14, 5:19) consistent with endoluminal clips/gastric procedure. Ingested material noted within the gastric lumen. Stomach is within normal limits. No evidence of bowel wall thickening or dilatation. No pneumatosis. Appendix appears normal. No findings of perirectal abscess. Vascular/Lymphatic: No abdominal aorta or iliac aneurysm. Severe atherosclerotic plaque of the aorta and its branches. No abdominal, pelvic, or inguinal lymphadenopathy. Reproductive: Prostate is unremarkable. Other: No intraperitoneal free fluid. No intraperitoneal free gas. No organized fluid collection. Musculoskeletal: No abdominal wall hernia or abnormality. No suspicious lytic or blastic osseous lesions. No acute displaced fracture. Multilevel degenerative changes of the spine with L5-S1  intervertebral disc space vacuum phenomenon and endplate sclerosis. IMPRESSION: 1. No acute intra-abdominal or intrapelvic abnormality with limited evaluation on this noncontrast study. No definite bowel perforation in a patient status post recent endoscopy. 2.  Aortic Atherosclerosis (ICD10-I70.0) - severe. 3.  Emphysema (ICD10-J43.9). These results were called by telephone at the time of interpretation on 11/18/2020 at 4:23 am to provider JOSHUA LONG , who verbally acknowledged these results. Electronically Signed   By: Tish Frederickson M.D.   On: 11/18/2020 04:24   DG Chest 2 View  Result Date: 11/01/2020 CLINICAL DATA:  Shortness of breath. EXAM: CHEST -  2 VIEW COMPARISON:  CT 01/18/2020.  Chest x-ray 06/11/2019. FINDINGS: Mediastinum and hilar structures normal. Cardiomegaly with mild pulmonary venous congestion. Mild interstitial prominence suggesting mild interstitial edema. No pleural effusion or pneumothorax. Left costophrenic angle incompletely imaged. No pneumothorax. IMPRESSION: Cardiomegaly mild pulmonary venous congestion. Mild interstitial prominence suggesting mild interstitial edema. Electronically Signed   By: Maisie Fus  Register   On: 11/01/2020 10:07   MR SHOULDER LEFT W WO CONTRAST  Result Date: 11/15/2020 CLINICAL DATA:  Left shoulder pain and weakness EXAM: MRI OF THE LEFT SHOULDER WITHOUT AND WITH CONTRAST TECHNIQUE: Multiplanar, multisequence MR imaging of the left shoulder was performed before and after the administration of intravenous contrast. CONTRAST:  59mL GADAVIST GADOBUTROL 1 MMOL/ML IV SOLN COMPARISON:  None. FINDINGS: Technical note: Despite efforts by the technologist and patient, motion artifact is present on today's exam and could not be eliminated. This reduces exam sensitivity and specificity. Rotator cuff: Partial thickness articular sided tearing of the distal supraspinatus and infraspinatus tendons involving up to 50% of the tendon depth. The torn tendon fibers are  retracted to the level of the humeral head apex. In total, the tear measures approximately 18 mm in AP dimension. No appreciable full-thickness component. Subscapularis and teres minor tendons intact. Muscles: Preserved bulk and signal intensity of the rotator cuff musculature without edema, atrophy, or fatty infiltration. Biceps long head:  Intact. Acromioclavicular Joint: Mild arthropathy of the AC joint. Os acromiale. Small volume subacromial-subdeltoid bursal fluid. Glenohumeral Joint: No cartilage defect.  No joint effusion. Labrum: Grossly intact, but evaluation is limited by lack of intraarticular fluid. Bones: There are extensive areas of confluent low T1 and T2 bone marrow signal including within the glenoid, coracoid process, scapula, and visualized mid to distal clavicle. No associated bone marrow edema. No enhancement within these areas on postcontrast imaging. No pathologic fracture. No malalignment. Other: No soft tissue edema or fluid collections. No abnormal postcontrast enhancement. IMPRESSION: 1. Partial-thickness articular-sided tearing of the distal supraspinatus and infraspinatus tendons involving up to 50% of the tendon depth. The torn tendon fibers are retracted to the level of the humeral head apex. No full-thickness rotator cuff tear. 2. Mild AC joint arthropathy with os acromiale. 3. Mild subacromial-subdeltoid bursitis. 4. Extensive areas of confluent low T1 and T2 bone marrow signal including within the glenoid, coracoid process, scapula, and visualized mid to distal clavicle. No associated bone marrow edema or enhancement. Findings are nonspecific and could be secondary to previous blood transfusions. Other hematologic abnormalities, lymphoproliferative process, or metastatic disease could have a similar appearance and are not excluded. Hematology-oncology evaluation is recommended. Electronically Signed   By: Duanne Guess D.O.   On: 11/15/2020 15:51   DG Chest Portable 1  View  Result Date: 11/18/2020 CLINICAL DATA:  Initial evaluation for acute shortness of breath. EXAM: PORTABLE CHEST 1 VIEW COMPARISON:  Prior radiograph from 11/01/2020 FINDINGS: Cardiomegaly, stable. Mediastinal silhouette within normal limits. Aortic atherosclerosis. Lungs no well inflated. No pulmonary edema or visible pleural effusion. No focal infiltrates. No pneumothorax. No acute osseous finding. Multiple remotely healed left-sided rib fractures noted. IMPRESSION: 1. No active cardiopulmonary disease. 2. Stable cardiomegaly without pulmonary edema. 3.  Aortic Atherosclerosis (ICD10-I70.0). Electronically Signed   By: Rise Mu M.D.   On: 11/18/2020 01:51   US LIVER DOPPLER  Result Date: 11/14/2020 CLINICAL DATA:  Elevated LFTs EXAM: DUPLEX ULTRASOUND OF LIVER TECHNIQUE: Color and duplex Doppler ultrasound was performed to evaluate the hepatic in-flow and out-flow vessels. COMPARISON:  None. FINDINGS: Liver: Mildly  heterogeneous and coarsened parenchymal echogenicity. Normal hepatic contour without nodularity. No focal lesion, mass or intrahepatic biliary ductal dilatation. Main Portal Vein size: 0.9 cm Portal Vein Velocities Main Prox:  55 cm/sec with normal hepatopetal directional flow. Main Mid: 48 cm/sec with normal hepatopetal directional flow. Main Dist:  48 cm/sec with normal hepatopetal directional flow. Right: 33 cm/sec with normal hepatopetal directional flow. Left: 33 cm/sec with normal hepatopetal directional flow. Hepatic Vein Velocities Right:  52 cm/sec Middle:  85 cm/sec Left:  78 cm/sec IVC: Present and patent with normal respiratory phasicity. Hepatic Artery Velocity:  222 cm/sec Splenic Vein Velocity:  52 cm/sec Spleen: 6.3 cm x 4.1 cm x 8.2 cm with a total volume of 111 cm^3 (411 cm^3 is upper limit normal) Portal Vein Occlusion/Thrombus: No Splenic Vein Occlusion/Thrombus: No Ascites: None Varices: None IMPRESSION: 1. Mildly heterogeneous and coarsened echogenicity of the  hepatic parenchyma. Differential considerations include mild hepatic steatosis versus intrinsic liver disease/fibrosis. 2. Patent portal and hepatic veins with normal directional flow. Electronically Signed   By: Malachy Moan M.D.   On: 11/14/2020 08:06    Microbiology: Recent Results (from the past 240 hour(s))  Resp Panel by RT-PCR (Flu A&B, Covid) Nasopharyngeal Swab     Status: None   Collection Time: 11/11/20 11:06 PM   Specimen: Nasopharyngeal Swab; Nasopharyngeal(NP) swabs in vial transport medium  Result Value Ref Range Status   SARS Coronavirus 2 by RT PCR NEGATIVE NEGATIVE Final    Comment: (NOTE) SARS-CoV-2 target nucleic acids are NOT DETECTED.  The SARS-CoV-2 RNA is generally detectable in upper respiratory specimens during the acute phase of infection. The lowest concentration of SARS-CoV-2 viral copies this assay can detect is 138 copies/mL. A negative result does not preclude SARS-Cov-2 infection and should not be used as the sole basis for treatment or other patient management decisions. A negative result may occur with  improper specimen collection/handling, submission of specimen other than nasopharyngeal swab, presence of viral mutation(s) within the areas targeted by this assay, and inadequate number of viral copies(<138 copies/mL). A negative result must be combined with clinical observations, patient history, and epidemiological information. The expected result is Negative.  Fact Sheet for Patients:  BloggerCourse.com  Fact Sheet for Healthcare Providers:  SeriousBroker.it  This test is no t yet approved or cleared by the [place] FDA and  has been authorized for detection and/or diagnosis of SARS-CoV-2 by FDA under an Emergency Use Authorization (EUA). This EUA will remain  in effect (meaning this test can be used) for the duration of the COVID-19 declaration under Section 564(b)(1) of the Act,  21 U.S.C.section 360bbb-3(b)(1), unless the authorization is terminated  or revoked sooner.       Influenza A by PCR NEGATIVE NEGATIVE Final   Influenza B by PCR NEGATIVE NEGATIVE Final    Comment: (NOTE) The Xpert Xpress SARS-CoV-2/FLU/RSV plus assay is intended as an aid in the diagnosis of influenza from Nasopharyngeal swab specimens and should not be used as a sole basis for treatment. Nasal washings and aspirates are unacceptable for Xpert Xpress SARS-CoV-2/FLU/RSV testing.  Fact Sheet for Patients: BloggerCourse.com  Fact Sheet for Healthcare Providers: SeriousBroker.it  This test is not yet approved or cleared by the [place] FDA and has been authorized for detection and/or diagnosis of SARS-CoV-2 by FDA under an Emergency Use Authorization (EUA). This EUA will remain in effect (meaning this test can be used) for the duration of the COVID-19 declaration under Section 564(b)(1) of the Act, 21 U.S.C.  section 360bbb-3(b)(1), unless the authorization is terminated or revoked.  Performed at [facility], [address], [place], [place] [address]   MRSA PCR Screening     Status: None   Collection Time: 11/12/20  1:46 AM   Specimen: Nasal Mucosa; Nasopharyngeal  Result Value Ref Range Status   MRSA by PCR NEGATIVE NEGATIVE Final    Comment:        The GeneXpert MRSA Assay (FDA approved for NASAL specimens only), is one component of a comprehensive MRSA colonization surveillance program. It is not intended to diagnose MRSA infection nor to guide or monitor treatment for MRSA infections. Performed at [facility], [address], [place], [place] [address]   Resp Panel by RT-PCR (Flu A&B, Covid) Nasopharyngeal Swab     Status: None   Collection Time: 11/17/20 11:51 PM   Specimen: Nasopharyngeal Swab; Nasopharyngeal(NP) swabs in vial transport medium  Result Value Ref Range  Status   SARS Coronavirus 2 by RT PCR NEGATIVE NEGATIVE Final    Comment: (NOTE) SARS-CoV-2 target nucleic acids are NOT DETECTED.  The SARS-CoV-2 RNA is generally detectable in upper respiratory specimens during the acute phase of infection. The lowest concentration of SARS-CoV-2 viral copies this assay can detect is 138 copies/mL. A negative result does not preclude SARS-Cov-2 infection and should not be used as the sole basis for treatment or other patient management decisions. A negative result may occur with  improper specimen collection/handling, submission of specimen other than nasopharyngeal swab, presence of viral mutation(s) within the areas targeted by this assay, and inadequate number of viral copies(<138 copies/mL). A negative result must be combined with clinical observations, patient history, and epidemiological information. The expected result is Negative.  Fact Sheet for Patients:  BloggerCourse.com  Fact Sheet for Healthcare Providers:  SeriousBroker.it  This test is no t yet approved or cleared by the [place] FDA and  has been authorized for detection and/or diagnosis of SARS-CoV-2 by FDA under an Emergency Use Authorization (EUA). This EUA will remain  in effect (meaning this test can be used) for the duration of the COVID-19 declaration under Section 564(b)(1) of the Act, 21 U.S.C.section 360bbb-3(b)(1), unless the authorization is terminated  or revoked sooner.       Influenza A by PCR NEGATIVE NEGATIVE Final   Influenza B by PCR NEGATIVE NEGATIVE Final    Comment: (NOTE) The Xpert Xpress SARS-CoV-2/FLU/RSV plus assay is intended as an aid in the diagnosis of influenza from Nasopharyngeal swab specimens and should not be used as a sole basis for treatment. Nasal washings and aspirates are unacceptable for Xpert Xpress SARS-CoV-2/FLU/RSV testing.  Fact Sheet for  Patients: BloggerCourse.com  Fact Sheet for Healthcare Providers: SeriousBroker.it  This test is not yet approved or cleared by the [place] FDA and has been authorized for detection and/or diagnosis of SARS-CoV-2 by FDA under an Emergency Use Authorization (EUA). This EUA will remain in effect (meaning this test can be used) for the duration of the COVID-19 declaration under Section 564(b)(1) of the Act, 21 U.S.C. section 360bbb-3(b)(1), unless the authorization is terminated or revoked.  Performed at [facility], [address], [place], [place] [address]      Labs: Basic Metabolic Panel: Recent Labs  Lab 11/14/20 0419 11/15/20 0246 11/18/20 0048 11/18/20 1137 11/19/20 0341  NA 138 140 140 139 139  K 4.2 3.5 4.0 4.4 4.3  CL 97* 98 102 105 104  CO2 37* 36* 26 28 30  GLUCOSE 110* 147* 199* 113* 113*  BUN 17 10 29* 37* 26*  CREATININE 1.00 1.00 2.13* 1.09 0.98  CALCIUM 8.2* 8.2* 8.2* 7.9* 8.0*   Liver Function Tests: Recent Labs  Lab 11/18/20 0048 11/19/20 0341  AST 23 19  ALT 21 16  ALKPHOS 49 40  BILITOT 0.6 0.3  PROT 5.9* 5.3*  ALBUMIN 3.1* 2.9*   Recent Labs  Lab 11/18/20 0048  LIPASE 43   No results for input(s): AMMONIA in the last 168 hours. CBC: Recent Labs  Lab 11/14/20 0419 11/14/20 1124 11/15/20 0246 11/15/20 1306 11/18/20 0048 11/18/20 1246 11/18/20 1834 11/19/20 0341 11/19/20 0839  WBC 11.4*  --  10.5  --  18.3* 11.4*  --  9.4  --   NEUTROABS  --   --   --   --  15.1* 8.2*  --   --   --   HGB 7.4*   < > 7.4*   < > 7.1* 5.9* 6.7* 7.6* 7.4*  HCT 28.2*   < > 27.6*   < > 27.3* 22.4* 24.8* 27.9* 26.5*  MCV 91.3  --  89.6  --  89.8 89.2  --  91.2  --   PLT 130*  --  126*  --  205 158  --  167  --    < > = values in this interval not displayed.   Cardiac Enzymes: No results for input(s): CKTOTAL, CKMB, CKMBINDEX, TROPONINI in the last 168 hours. BNP: BNP (last 3  results) Recent Labs    11/01/20 1012  BNP 700.5*    ProBNP (last 3 results) No results for input(s): PROBNP in the last 8760 hours.  CBG: No results for input(s): GLUCAP in the last 168 hours.     Signed:  Albertine Grates MD, PhD, FACP  [facility] 11/19/2020, 7:26 PM

## 2020-11-21 ENCOUNTER — Encounter (HOSPITAL_COMMUNITY): Payer: Self-pay | Admitting: Internal Medicine

## 2020-11-21 ENCOUNTER — Telehealth: Payer: Self-pay

## 2020-11-21 NOTE — Telephone Encounter (Signed)
Transition Care Management Follow-up Telephone Call Date of discharge and from where: 11/19/2020, [facility] How have you been since you were released from the hospital? He said he is feeling all right.  Any questions or concerns? No  Items Reviewed: Did the pt receive and understand the discharge instructions provided? Yes  Medications obtained and verified? Yes  he said he has all medications including the xarelto and pepto bismol and did not have any questions about the med regime  Other? No  Any new allergies since your discharge? No  Dietary orders reviewed? No Do you have support at home?  He lives in a rooming house but has family in the area that can provide support  Home Care and Equipment/Supplies: Were home health services ordered? no If so, what is the name of the agency? N/a  Has the agency set up a time to come to the patient's home? not applicable Were any new equipment or medical supplies ordered?  No What is the name of the medical supply agency? N/a Were you able to get the supplies/equipment? not applicable Do you have any questions related to the use of the equipment or supplies? No  Has O2 from [employer], Home-fill system. Only uses O2 when needed.   Has nebulizer. Still needs a mouthpiece. He said he has been working with [employer] to try to obtain a new one. Instructed him to call this CM if he is not able to get the mouthpiece from [employer].  He can pick one up at [facility] if needed.  Functional Questionnaire: (I = Independent and D = Dependent) ADLs: independent   Follow up appointments reviewed:  PCP Hospital f/u appt confirmed? Yes  Scheduled to see Dr Alvis Lemmings 12/05/2020.   Specialist Hospital f/u appt confirmed? Yes  Scheduled to see GI  12/21/2020 and cardiology - 01/30/2021.   Are transportation arrangements needed? No  If their condition worsens, is the pt aware to call PCP or go to the Emergency Dept.? Yes Was the patient provided with contact  information for the PCP's office or ED? Yes Was to pt encouraged to call back with questions or concerns? Yes

## 2020-11-21 NOTE — Telephone Encounter (Signed)
From the discharge call.    He said he is feeling all right and he has all medications including the xarelto and pepto bismol and did not have any questions about the med regime     Has O2 from [employer] [facility], Home-fill system. Only uses O2 when needed.   Has nebulizer. Still needs a mouthpiece. He said he has been working with [employer] [facility] to try to obtain a new one. Instructed him to call this CM if he is not able to get the mouthpiece from [employer].  He can pick one up at [facility] if needed.  Has appointment with Dr Alvis Lemmings 12/05/2020.

## 2020-11-22 ENCOUNTER — Other Ambulatory Visit: Payer: Self-pay

## 2020-11-22 MED FILL — Budesonide-Glycopyrrolate-Formoterol Aers 160-9-4.8 MCG/ACT: RESPIRATORY_TRACT | 30 days supply | Qty: 10.7 | Fill #2 | Status: AC

## 2020-11-23 ENCOUNTER — Inpatient Hospital Stay: Payer: Medicare HMO | Admitting: Family Medicine

## 2020-11-23 ENCOUNTER — Other Ambulatory Visit: Payer: Self-pay

## 2020-11-23 DIAGNOSIS — I5043 Acute on chronic combined systolic (congestive) and diastolic (congestive) heart failure: Secondary | ICD-10-CM | POA: Diagnosis not present

## 2020-12-05 ENCOUNTER — Encounter: Payer: Self-pay | Admitting: Family Medicine

## 2020-12-05 ENCOUNTER — Telehealth: Payer: Self-pay

## 2020-12-05 ENCOUNTER — Ambulatory Visit: Payer: Medicare HMO | Attending: Family Medicine | Admitting: Family Medicine

## 2020-12-05 ENCOUNTER — Other Ambulatory Visit: Payer: Self-pay

## 2020-12-05 VITALS — BP 132/74 | HR 86 | Ht 75.0 in | Wt 217.2 lb

## 2020-12-05 DIAGNOSIS — I110 Hypertensive heart disease with heart failure: Secondary | ICD-10-CM | POA: Diagnosis not present

## 2020-12-05 DIAGNOSIS — Z23 Encounter for immunization: Secondary | ICD-10-CM | POA: Diagnosis not present

## 2020-12-05 DIAGNOSIS — I4892 Unspecified atrial flutter: Secondary | ICD-10-CM

## 2020-12-05 DIAGNOSIS — J9612 Chronic respiratory failure with hypercapnia: Secondary | ICD-10-CM

## 2020-12-05 DIAGNOSIS — I700 Atherosclerosis of aorta: Secondary | ICD-10-CM

## 2020-12-05 DIAGNOSIS — J432 Centrilobular emphysema: Secondary | ICD-10-CM | POA: Diagnosis not present

## 2020-12-05 DIAGNOSIS — J9611 Chronic respiratory failure with hypoxia: Secondary | ICD-10-CM | POA: Diagnosis not present

## 2020-12-05 DIAGNOSIS — I5042 Chronic combined systolic (congestive) and diastolic (congestive) heart failure: Secondary | ICD-10-CM

## 2020-12-05 DIAGNOSIS — K250 Acute gastric ulcer with hemorrhage: Secondary | ICD-10-CM

## 2020-12-05 DIAGNOSIS — R600 Localized edema: Secondary | ICD-10-CM

## 2020-12-05 NOTE — Progress Notes (Addendum)
Subjective:  Patient ID: Jared [name], male    DOB: 06/11/1955  Age: 65 y.o. 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 pylori gastritis and gastric ulcers s/p clipping.   He received 2 units PRBC.  He also had acute kidney injury thought to be secondary to hypotension/dehydration which improved with hydration.  Xarelto was held for short while then resumed prior to discharge. Prior to this hospitalization he had been hospitalized from 11/11/2020 through 11/15/2020 for acute upper GI bleed.  Interval History: He has no abdominal pain but states 'stomach feels tight'. He has no hematochezia.  He is needing explanation as to why he has had recurrent upper GI bleed.  Tolerating his PPI and denies presence of nausea. He ran out of his portable oxygen and had to use the Oxygen in the Clinic.  States [facility] had come over to his house last night and placed a portable oxygen tank to fill out however on his way out the door this morning tank was empty and he came to the clinic without oxygen.  He has swelling in his ankles which was present prior to hospitalization, improved during hospitalization but is still present.  Review of his weight indicates he has lost 8 pounds in the last 2 weeks.  He denies worsening shortness of breath and has been compliant with his Lasix.  He has upcoming appointments with GI and cardiology next month. Past Medical History:  Diagnosis Date    AVM (arteriovenous malformation)    CAD (coronary artery disease)    a. LHC 5/12:  LAD 20, pLCx 20, pRCA 40, dRCA 40, EF 35%, diff HK  //  b. Myoview 4/16: Overall Impression:  High risk stress nuclear study There is no evidence of ischemia.  There is severe LV dysfunction. LV Ejection Fraction: 30%.  LV Wall Motion:  There is global LV hypokinesis.     CAP (community acquired pneumonia) 09/2013   Chronic combined systolic and diastolic CHF (congestive heart failure) (HCC)    a. Echo 4/16:Mild LVH, EF 40-45%, diffuse HK //  b. Echo 8/17: EF 35-40%, diffuse HK, diastolic dysfunction, aortic sclerosis, trivial MR, moderate LAE, normal RVSF, moderate RAE, mild TR, PASP 42 mmHg // c. Echo 4/18: Mild concentric LVH, EF 30-35, normal wall motion, grade 1 diastolic dysfunction, PASP 49   Chronic respiratory failure (HCC)    Cluster headache    "hx; haven't had one in awhile" (01/09/2016)   COPD (chronic obstructive pulmonary disease) (HCC)    [name]/notes 01/09/2016   History of CVA (cerebrovascular accident)    Hypertension    IDA (iron deficiency anemia)    Moderate tobacco use disorder    NICM (nonischemic cardiomyopathy) (HCC)    Nicotine addiction    Tobacco abuse     Past Surgical History:  Procedure Laterality Date   BIOPSY  11/12/2020   Procedure: BIOPSY;  Surgeon: Lemar LoftyMansouraty, Gabriel Jr., MD;  [facility];  Service: Gastroenterology;;   CARDIAC CATHETERIZATION  10/2010  LM normal, LAD with 20% irregularities, LCX with 20%, RCA with 40% prox and 40% distal - EF of 35%   CATARACT EXTRACTION, BILATERAL     COLONOSCOPY W/ BIOPSIES AND POLYPECTOMY     COLONOSCOPY WITH PROPOFOL N/A 09/06/2018   Procedure: COLONOSCOPY WITH PROPOFOL;  Surgeon: Tressia Danas, MD;  Location: [facility] ENDOSCOPY;  Service: Gastroenterology;  Laterality: N/A;   ENTEROSCOPY N/A 09/28/2018   Procedure: ENTEROSCOPY;  Surgeon: Jeani Hawking, MD;  Location: [facility] ENDOSCOPY;  Service: Endoscopy;  Laterality: N/A;    ENTEROSCOPY N/A 10/28/2018   Procedure: ENTEROSCOPY;  Surgeon: Tressia Danas, MD;  Location: [facility] ENDOSCOPY;  Service: Gastroenterology;  Laterality: N/A;   ENTEROSCOPY N/A 10/09/2020   Procedure: ENTEROSCOPY;  Surgeon: Hilarie Fredrickson, MD;  Location: [facility] ENDOSCOPY;  Service: Endoscopy;  Laterality: N/A;   ESOPHAGOGASTRODUODENOSCOPY N/A 11/12/2020   Procedure: ESOPHAGOGASTRODUODENOSCOPY (EGD);  Surgeon: Lemar Lofty., MD;  [facility];  Service: Gastroenterology;  Laterality: N/A;   ESOPHAGOGASTRODUODENOSCOPY (EGD) WITH PROPOFOL N/A 09/05/2018   Procedure: ESOPHAGOGASTRODUODENOSCOPY (EGD) WITH PROPOFOL;  Surgeon: Benancio Deeds, MD;  Location: [facility] ENDOSCOPY;  Service: Gastroenterology;  Laterality: N/A;   ESOPHAGOGASTRODUODENOSCOPY (EGD) WITH PROPOFOL N/A 11/19/2020   Procedure: ESOPHAGOGASTRODUODENOSCOPY (EGD) WITH PROPOFOL;  Surgeon: Beverley Fiedler, MD;  Location: [facility] ENDOSCOPY;  Service: Gastroenterology;  Laterality: N/A;   EXCISION MASS HEAD     GIVENS CAPSULE STUDY N/A 09/06/2018   Procedure: GIVENS CAPSULE STUDY;  Surgeon: Tressia Danas, MD;  Location: [facility] ENDOSCOPY;  Service: Gastroenterology;  Laterality: N/A;   GIVENS CAPSULE STUDY N/A 09/26/2018   Procedure: GIVENS CAPSULE STUDY;  Surgeon: Beverley Fiedler, MD;  Location: [facility] ENDOSCOPY;  Service: Gastroenterology;  Laterality: N/A;   GIVENS CAPSULE STUDY N/A 06/14/2019   Procedure: GIVENS CAPSULE STUDY;  Surgeon: Napoleon Form, MD;  Location: [facility] ENDOSCOPY;  Service: Endoscopy;  Laterality: N/A;   HEMOSTASIS CLIP PLACEMENT  11/12/2020   Procedure: HEMOSTASIS CLIP PLACEMENT;  Surgeon: Lemar Lofty., MD;  [facility];  Service: Gastroenterology;;   HEMOSTASIS CONTROL  11/12/2020   Procedure: HEMOSTASIS CONTROL;  Surgeon: Lemar Lofty., MD;  [facility];  Service: Gastroenterology;;   HOT HEMOSTASIS N/A 10/28/2018   Procedure: HOT HEMOSTASIS (ARGON PLASMA COAGULATION/BICAP);  Surgeon:  Tressia Danas, MD;  Location: [facility] ENDOSCOPY;  Service: Gastroenterology;  Laterality: N/A;   HOT HEMOSTASIS N/A 11/12/2020   Procedure: HOT HEMOSTASIS (ARGON PLASMA COAGULATION/BICAP);  Surgeon: Lemar Lofty., MD;  Location[facility];  Service: Gastroenterology;  Laterality: N/A;   INCISION AND DRAINAGE PERIRECTAL ABSCESS N/A 06/05/2017   Procedure: IRRIGATION AND DEBRIDEMENT PERIRECTAL ABSCESS;  Surgeon: Andria Meuse, MD;  Location: [facility] OR;  Service: General;  Laterality: N/A;   SUBMUCOSAL TATTOO INJECTION  11/12/2020   Procedure: SUBMUCOSAL TATTOO INJECTION;  Surgeon: Lemar Lofty., MD;  [facility];  Service: Gastroenterology;;   VIDEO BRONCHOSCOPY Bilateral 05/08/2016   Procedure: VIDEO BRONCHOSCOPY WITH FLUORO;  Surgeon: Oretha Milch, MD;  Location: [facility] ENDOSCOPY;  Service: Cardiopulmonary;  Laterality: Bilateral;    Family History  Problem Relation Age of Onset   Heart disease Mother    Diabetes Mother    Colon cancer Mother    Liver cancer Mother    Cancer Father        type unknown   Diabetes Sister        x 2   Diabetes Brother     Allergies  Allergen Reactions   Lisinopril Cough    Outpatient Medications Prior to  Visit  Medication Sig Dispense Refill   albuterol (PROVENTIL) (2.5 MG/3ML) 0.083% nebulizer solution Take 3 mLs (2.5 mg total) by nebulization every 4 (four) hours as needed for wheezing or shortness of breath. 75 mL 0   albuterol (VENTOLIN HFA) 108 (90 Base) MCG/ACT inhaler INHALE 2 PUFFS INTO THE LUNGS EVERY 6 (SIX) HOURS AS NEEDED FOR WHEEZING OR SHORTNESS OF BREATH. 18 g 5   atorvastatin (LIPITOR) 80 MG tablet Take 1 tablet (80 mg total) by mouth daily.     bismuth subsalicylate (PEPTO BISMOL) 262 MG chewable tablet Chew 2 tablets (524 mg total) by mouth 4 (four) times daily. 30 tablet 0   Budeson-Glycopyrrol-Formoterol 160-9-4.8 MCG/ACT AERO INHALE 2 PUFFS INTO THE LUNGS 2 (TWO) TIMES DAILY. 10.7 g 11   ferrous  sulfate 325 (65 FE) MG tablet Take 1 tablet (325 mg total) by mouth 2 (two) times daily with a meal. 60 tablet 1   furosemide (LASIX) 40 MG tablet Take 1 tablet (40 mg total) by mouth daily.     gabapentin (NEURONTIN) 300 MG capsule Take 1 capsule (300 mg total) by mouth at bedtime.     Misc. Devices MISC Inhale 3 L into the lungs as needed. Portable oxygen concentrator. Diagnosis COPD.     Multiple Vitamin (MULTIVITAMIN WITH MINERALS) TABS tablet Take 1 tablet by mouth daily.     nitroGLYCERIN (NITROSTAT) 0.4 MG SL tablet Place 1 tablet (0.4 mg total) under the tongue every 5 (five) minutes as needed for chest pain.     pantoprazole (PROTONIX) 40 MG tablet Take 1 tablet (40 mg total) by mouth 2 (two) times daily. 30 tablet 1   XARELTO 20 MG TABS tablet Take 20 mg by mouth daily.     No facility-administered medications prior to visit.     ROS Review of Systems  Constitutional:  Negative for activity change and appetite change.  HENT:  Negative for sinus pressure and sore throat.   Eyes:  Negative for visual disturbance.  Respiratory:  Negative for cough, chest tightness and shortness of breath.   Cardiovascular:  Positive for leg swelling. Negative for chest pain.  Gastrointestinal:  Negative for abdominal distention, abdominal pain, constipation and diarrhea.  Endocrine: Negative.   Genitourinary:  Negative for dysuria.  Musculoskeletal:  Negative for joint swelling and myalgias.  Skin:  Negative for rash.  Allergic/Immunologic: Negative.   Neurological:  Negative for weakness, light-headedness and numbness.  Psychiatric/Behavioral:  Negative for dysphoric mood and suicidal ideas.    Objective:  BP 132/74   Pulse 86   Ht 6\' 3"  (1.905 m)   Wt 217 lb 3.2 oz (98.5 kg)   SpO2 100%   BMI 27.15 kg/m   BP/Weight 12/05/2020 11/19/2020 11/17/2020  Systolic BP 132 154 -  Diastolic BP 74 81 -  Wt. (Lbs) 217.2 225.75 -  BMI 27.15 - 28.22      Physical Exam Constitutional:       Appearance: He is well-developed.  HENT:     Head:     Comments: On 2 L of oxygen via nasal cannula Neck:     Vascular: No JVD.  Cardiovascular:     Rate and Rhythm: Normal rate.     Heart sounds: Normal heart sounds. No murmur heard. Pulmonary:     Effort: Pulmonary effort is normal.     Breath sounds: Normal breath sounds. No wheezing or rales.  Chest:     Chest wall: No tenderness.  Abdominal:     General:  Bowel sounds are normal. There is no distension.     Palpations: Abdomen is soft. There is no mass.     Tenderness: There is no abdominal tenderness.  Musculoskeletal:        General: Normal range of motion.     Right lower leg: Edema (1+ R ankle edema) present.     Left lower leg: No edema.  Neurological:     Mental Status: He is alert and oriented to person, place, and time.  Psychiatric:        Mood and Affect: Mood normal.    CMP Latest Ref Rng & Units 11/19/2020 11/18/2020 11/18/2020  Glucose 70 - 99 mg/dL 622(Q) 333(L) 456(Y)  BUN 8 - 23 mg/dL 56(L) 89(H) 73(S)  Creatinine 0.61 - 1.24 mg/dL 2.87 6.81 1.57(W)  Sodium 135 - 145 mmol/L 139 139 140  Potassium 3.5 - 5.1 mmol/L 4.3 4.4 4.0  Chloride 98 - 111 mmol/L 104 105 102  CO2 22 - 32 mmol/L 30 28 26   Calcium 8.9 - 10.3 mg/dL 8.0(L) 7.9(L) 8.2(L)  Total Protein 6.5 - 8.1 g/dL 5.3(L) - 5.9(L)  Total Bilirubin 0.3 - 1.2 mg/dL 0.3 - 0.6  Alkaline Phos 38 - 126 U/L 40 - 49  AST 15 - 41 U/L 19 - 23  ALT 0 - 44 U/L 16 - 21    Lipid Panel     Component Value Date/Time   CHOL 148 01/29/2020 1020   TRIG 138 01/29/2020 1020   HDL 51 01/29/2020 1020   CHOLHDL 2.9 01/29/2020 1020   CHOLHDL 2.8 01/11/2016 0446   VLDL 14 01/11/2016 0446   LDLCALC 73 01/29/2020 1020    CBC    Component Value Date/Time   WBC 9.4 11/19/2020 0341   RBC 3.06 (L) 11/19/2020 0341   HGB 7.4 (L) 11/19/2020 0839   HGB 11.3 (L) 05/10/2020 1000   HCT 26.5 (L) 11/19/2020 0839   HCT 40.9 05/10/2020 1000   PLT 167 11/19/2020 0341   PLT 199  05/10/2020 1000   MCV 91.2 11/19/2020 0341   MCV 81 05/10/2020 1000   MCH 24.8 (L) 11/19/2020 0341   MCHC 27.2 (L) 11/19/2020 0341   RDW 17.9 (H) 11/19/2020 0341   RDW 17.6 (H) 05/10/2020 1000   LYMPHSABS 1.9 11/18/2020 1246   LYMPHSABS 2.5 06/24/2019 0945   MONOABS 1.2 (H) 11/18/2020 1246   EOSABS 0.0 11/18/2020 1246   EOSABS 0.2 06/24/2019 0945   BASOSABS 0.0 11/18/2020 1246   BASOSABS 0.1 06/24/2019 0945    Lab Results  Component Value Date   HGBA1C 5.7 (H) 11/15/2020    Assessment & Plan:  1. Acute gastric ulcer with hemorrhage High risk patient with multiple hospitalization for same He is asymptomatic Counseled against NSAID use, foods that could worsen gastric ulcer We will check CBC Keep upcoming GI appointment - CBC with Differential/Platelet - Basic Metabolic Panel  2. Pedal edema Right ankle edema We will hold off on adjusting Lasix dose due to the fact that his weight is down 8 pounds and he is euvolemic Advised to obtain compression stockings, elevate feet, low-sodium diet  3. Hypertensive heart disease with chronic combined systolic and diastolic congestive heart failure (HCC) Euvolemic with EF of 45 to 50% Blood pressure is controlled Continue Lasix Follow-up with cardiology  4. Atrial flutter, unspecified type (HCC) Stable Currently on Xarelto which places him at high risk for bleeding Will need to weigh risk versus benefit.  For now GI has given the go ahead  to continue with Xarelto Follow-up with cardiology  5. Centrilobular emphysema (HCC) No acute exacerbation Continue inhalers  6. Chronic respiratory failure with hypoxia and hypercapnia (HCC) Currently out of oxygen He has been using oxygen in the clinic Will have case manager contact [employer] to provide oxygen for his trip back home  7. Need for pneumococcal vaccine - Pneumococcal conjugate vaccine 20-valent  8.  Atherosclerosis of aorta Currently on atorvastatin Low-cholesterol  diet No orders of the defined types were placed in this encounter.   Follow-up: Return in about 3 months (around 03/07/2021).    50 minutes of total time including median intraservice time explaining diagnosis of peptic ulcer disease, risk factors and management as well as explaining hospital course to the patient.  Time also included coordinating with [facility] with regards to bringing in an oxygen tank for the patient to take home.      [name], MD, FAAFP. [facility] [place], [place] [phone]   12/05/2020, 1:18 PM

## 2020-12-05 NOTE — Progress Notes (Signed)
Needs help with O2 Swelling in ankles Discuss hospital visit.

## 2020-12-05 NOTE — Patient Instructions (Signed)
Peptic Ulcer  A peptic ulcer is a painful sore in the lining of your stomach or the firstpart of your small intestine. What are the causes? Common causes of this condition include: An infection. Using certain pain medicines too often or too much. What increases the risk? You are more likely to get this condition if you: Smoke. Have a family history of ulcer disease. Drink alcohol. Have been hospitalized in an intensive care unit (ICU). What are the signs or symptoms? Symptoms include: Burning pain in the area between the chest and the belly button. The pain may: Not go away (be persistent). Be worse when your stomach is empty. Be worse at night. Heartburn. Feeling sick to your stomach (nauseous) and throwing up (vomiting). Bloating. If the ulcer results in bleeding, it can cause you to: Have poop (stool) that is black and looks like tar. Throw up bright red blood. Throw up material that looks like coffee grounds. How is this treated? Treatment for this condition may include: Stopping things that can cause the ulcer, such as: Smoking. Using pain medicines. Medicines to reduce stomach acid. Antibiotic medicines if the ulcer is caused by an infection. A procedure that is done using a small, flexible tube that has a camera at the end (upper endoscopy). This may be done if you have a bleeding ulcer. Surgery. This may be needed if: You have a lot of bleeding. The ulcer caused a hole somewhere in the digestive system. Follow these instructions at home: Do not drink alcohol if your doctor tells you not to drink. Limit how much caffeine you take in. Do not use any products that contain nicotine or tobacco, such as cigarettes, e-cigarettes, and chewing tobacco. If you need help quitting, ask your doctor. Take over-the-counter and prescription medicines only as told by your doctor. Do not stop or change your medicines unless you talk with your doctor about it first. Do not take  aspirin, ibuprofen, or other NSAIDs unless your doctor told you to do so. Keep all follow-up visits as told by your doctor. This is important. Contact a doctor if: You do not get better in 7 days after you start treatment. You keep having an upset stomach (indigestion) or heartburn. Get help right away if: You have sudden, sharp pain in your belly (abdomen). You have belly pain that does not go away. You have bloody poop (stool) or black, tarry poop. You throw up blood. It may look like coffee grounds. You feel light-headed or feel like you may pass out (faint). You get weak. You get sweaty or feel sticky and cold to the touch (clammy). Summary Symptoms of a peptic ulcer include burning pain in the area between the chest and the belly button. Take medicines only as told by your doctor. Limit how much alcohol and caffeine you have. Keep all follow-up visits as told by your doctor. This information is not intended to replace advice given to you by your health care provider. Make sure you discuss any questions you have with your healthcare provider. Document Revised: 12/03/2017 Document Reviewed: 12/03/2017 Elsevier Patient Education  2022 [employer].

## 2020-12-05 NOTE — Telephone Encounter (Signed)
Met with the patient when he was in the clinic today.  He explained that his self fill O2 system at home is not working and he did not have O2 to bring with him to his appointment today. He said he contacted [facility] and they came out to his home but did not repair the system.   He was in agreement to having this CM contact [employer] [facility] at the hospital to see if they could deliver an O2 to this clinic for him.  He then asked for information about ALF.  He explained that he thinks he may need more help at home and is interested in ALF. He said that he contacted [facility] but they are not able to provide what he needs.    This CM provided him with a list of ALF. He receives a monthly check. Explained to him that he can call the facilities listed and check availability. Instructed him to call this CM with any questions.   Call placed to El[place] at [place] [facility] [place] about need for O2 to be delivered to [facility].  She said she would place the request.   informed the patient that O2 would be delivered to this clinic but he said he wasn't sure he would wait and he ended up leaving prior to the delivery of the O2.   Call placed to [facility] and informed her that the patient left the clinic, O2 is not needed but the patient still needs repair of the self fill system at his home. She said she would place the request for service.

## 2020-12-06 LAB — CBC WITH DIFFERENTIAL/PLATELET
Basophils Absolute: 0.1 10*3/uL (ref 0.0–0.2)
Basos: 1 %
EOS (ABSOLUTE): 0.1 10*3/uL (ref 0.0–0.4)
Eos: 1 %
Hematocrit: 31.7 % — ABNORMAL LOW (ref 37.5–51.0)
Hemoglobin: 8.4 g/dL — ABNORMAL LOW (ref 13.0–17.7)
Immature Grans (Abs): 0 10*3/uL (ref 0.0–0.1)
Immature Granulocytes: 0 %
Lymphocytes Absolute: 1.9 10*3/uL (ref 0.7–3.1)
Lymphs: 27 %
MCH: 21.6 pg — ABNORMAL LOW (ref 26.6–33.0)
MCHC: 26.5 g/dL — ABNORMAL LOW (ref 31.5–35.7)
MCV: 82 fL (ref 79–97)
Monocytes Absolute: 0.7 10*3/uL (ref 0.1–0.9)
Monocytes: 11 %
Neutrophils Absolute: 4.3 10*3/uL (ref 1.4–7.0)
Neutrophils: 60 %
Platelets: 465 10*3/uL — ABNORMAL HIGH (ref 150–450)
RBC: 3.89 x10E6/uL — ABNORMAL LOW (ref 4.14–5.80)
RDW: 18.9 % — ABNORMAL HIGH (ref 11.6–15.4)
WBC: 7 10*3/uL (ref 3.4–10.8)

## 2020-12-06 LAB — BASIC METABOLIC PANEL
BUN/Creatinine Ratio: 12 (ref 10–24)
BUN: 11 mg/dL (ref 8–27)
CO2: 35 mmol/L — ABNORMAL HIGH (ref 20–29)
Calcium: 9.1 mg/dL (ref 8.6–10.2)
Chloride: 100 mmol/L (ref 96–106)
Creatinine, Ser: 0.91 mg/dL (ref 0.76–1.27)
Glucose: 178 mg/dL — ABNORMAL HIGH (ref 65–99)
Potassium: 4.2 mmol/L (ref 3.5–5.2)
Sodium: 145 mmol/L — ABNORMAL HIGH (ref 134–144)
eGFR: 92 mL/min/{1.73_m2} (ref >59)

## 2020-12-09 ENCOUNTER — Telehealth: Payer: Self-pay

## 2020-12-09 NOTE — Telephone Encounter (Signed)
-----   Message from [name], MD sent at 12/06/2020 12:33 PM EDT ----- Please inform him that he still anemic but this has improved compared to last visit.  Please advise to keep his appointment with GI.

## 2020-12-09 NOTE — Telephone Encounter (Signed)
Patient name and DOB has been verified Patient was informed of lab results. Patient had no questions.  

## 2020-12-13 ENCOUNTER — Other Ambulatory Visit: Payer: Self-pay

## 2020-12-13 ENCOUNTER — Other Ambulatory Visit: Payer: Self-pay | Admitting: Family Medicine

## 2020-12-13 DIAGNOSIS — G629 Polyneuropathy, unspecified: Secondary | ICD-10-CM

## 2020-12-13 NOTE — Telephone Encounter (Signed)
  Notes to clinic:  Review for directions and qty   Requested Prescriptions  Pending Prescriptions Disp Refills   gabapentin (NEURONTIN) 300 MG capsule 30 capsule 6    Sig: TAKE 1 CAPSULE (300 MG TOTAL) BY MOUTH AT BEDTIME.      Neurology: Anticonvulsants - gabapentin Passed - 12/13/2020  9:27 AM      Passed - Valid encounter within last 12 months    Recent Outpatient Visits           1 week ago Acute gastric ulcer with hemorrhage   [facility] [name], MD   6 months ago Impacted cerumen of right ear   [facility] [name], MD   1 year ago Need for vaccination for zoster   [facility] Lois Huxley, Cornelius Moras, RPH-CPP   1 year ago Chronic respiratory failure with hypoxia [facility])   [facility] [name], MD   1 year ago COPD GOLD III/still smoking    [facility] [name], MD       Future Appointments             In 3 weeks  Post-COVID Care Clinic at [place]   In 1 month Pricilla Riffle, MD [facility], [facility]   In 3 months [name], Charlaine Dalton, MD [facility]

## 2020-12-14 ENCOUNTER — Other Ambulatory Visit: Payer: Self-pay

## 2020-12-14 MED ORDER — GABAPENTIN 300 MG PO CAPS
ORAL_CAPSULE | Freq: Every day | ORAL | 6 refills | Status: DC
Start: 2020-12-14 — End: 2021-06-27
  Filled 2020-12-14: qty 30, 30d supply, fill #0
  Filled 2021-01-18: qty 30, 30d supply, fill #1
  Filled 2021-02-28: qty 30, 30d supply, fill #2
  Filled 2021-04-11: qty 30, 30d supply, fill #3
  Filled 2021-05-15: qty 30, 30d supply, fill #4
  Filled 2021-06-19: qty 30, 30d supply, fill #5
  Filled 2021-06-19: qty 30, 30d supply, fill #0

## 2020-12-15 ENCOUNTER — Other Ambulatory Visit: Payer: Self-pay

## 2020-12-20 ENCOUNTER — Other Ambulatory Visit: Payer: Self-pay | Admitting: Family Medicine

## 2020-12-20 DIAGNOSIS — I5042 Chronic combined systolic (congestive) and diastolic (congestive) heart failure: Secondary | ICD-10-CM

## 2020-12-20 DIAGNOSIS — I110 Hypertensive heart disease with heart failure: Secondary | ICD-10-CM

## 2020-12-20 NOTE — Progress Notes (Signed)
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 Hasn't had anemia follow up with Hematology in over a year and since that time has been re-hospitalized with recurrent anemia / GI bleeding. Separately he was hospitalized last month with upper GI secondary to PUD --At some point we may refer him back to Hematology.  --Continue oral iron  # PUD. During most recent hospitalization in early June patient was actually found to have two gastric ulcers, one with a visible vessel (treated with epi injection and endoclips). 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 Repeat EGD a week later when patient returned to ED hypotensive showed clips in the gastric cardia/fundus. There was surrounding erythema and edema but no definitive ulcer.  --Patient completed quadruple therapy for H.pylori. He has been off PPI for at least a couple of weeks so will check H. pylori stool antigen to ensure eradication.  Following that will resume PPI at once daily.  --Will discuss case with patient's primary GI, Dr. Myrtie Neither to see if he would like to schedule patient for follow-up EGD with gastric biopsies   HISTORY OF PRESENT ILLNESS    Chief Complaint : Hospital follow-up after GI bleed  Jared [name] is a 65 y.o. male known to Dr. 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 fib on Xarelto, history of CVA, intestinal AVMs, H.pylori PUD,    Patient has been hospitalized multiple times for GI bleed / anemia secondary to intestinal AVMs.  Last year he was followed by hematology for management of his chronic anemia though he has not been seen at their office in over a year .   In June 2022 patient was  hospital with hematemesis in setting of NSAIDs. EGD showed two non-bleeding gastric ulcer, one with a non-bleeding gastric ulcer which we treated with epinephrine injection and endoclips. He was discharged home but returned a couple of days later with abdominal pain, hypotension, and a mild decline in hgb. EGD repeated and there was no evidence of recurrent bleeding. Endoclips were in place. Discharged home 11/19/20 on Quadruple therapy for H.pylori.  Patient says he completed H. pylori treatment.  He inquires about H.pylori and whether it was in the "contaiminated" water he consumed at [facility] in the 1970's.    PREVIOUS ENDOSCOPIC EVALUATIONS / PERTINENT STUDIES:  06/14/2019 capsule endoscopy: Complete capsule endoscopy with findings of gastric erythema/gastritis, no AVMs visualized, no active bleeding 10/28/2018 small bowel enteroscopy: Small AVM ablated, otherwise normal 09/28/2018 small bowel enteroscopy: Normal 09/26/2018 capsule endoscopy: Active bleeding 1 minute after entry into the duodenum, enteroscopy scheduled the next day March 2020 colonoscopy for GI bleeding:  complete exam, excellent prep.  Entire colon normal except for hemorrhoids 10/09/20 small bowel endoscopy: Normal small bowel enteroscopy to the proximal jejunum. Suspect iron deficiency secondary to intermittent mucosal oozing in the patient on chronic oral anticoagulation therapy 11/12/20 EGD:   --No gross lesions in esophagus proximally.  --Salmon-colored mucosa noted in distal esoohagus - not biopsied due to concern for possible underlying varices.  --Hematin (altered blood/coffee-ground-like material) in the entire stomach - lavaged. - 2 gastric ulcers as described above with a nonbleeding visible vessel (Forrest Class IIa). - Gastritis. Biopsied. - Blood  in the entire examined duodenum - lavaged. - No gross lesions in the entire examined duodenum. - Jejunal blood - lavaged . - No gross lesions in the entire examined  proximal jejunum. Tattooed distal extent.  FINAL MICROSCOPIC DIAGNOSIS:   A. GASTRIC BIOPSY:  - Chronic active gastritis with Helicobacter pylori with focal  intestinal metaplasia. Warthin-Starry is positive for Helicobacter pylori.  - No dysplasia or malignancy  11/19/20 EGD. Repeat exam done because patient returned to hospital hypotensive with abdominal pain  -Z-line irregular, 44 cm from the incisors. - Endoclips were found in the gastric cardia/fundus without evidence of rebleeding or stigmata or recent bleeding. Good treatment effective from endoclipping.    Past Medical History:  Diagnosis Date   AVM (arteriovenous malformation)    CAD (coronary artery disease)    a. LHC 5/12:  LAD 20, pLCx 20, pRCA 40, dRCA 40, EF 35%, diff HK  //  b. Myoview 4/16: Overall Impression:  High risk stress nuclear study There is no evidence of ischemia.  There is severe LV dysfunction. LV Ejection Fraction: 30%.  LV Wall Motion:  There is global LV hypokinesis.     CAP (community acquired pneumonia) 09/2013   Chronic combined systolic and diastolic CHF (congestive heart failure) (HCC)    a. Echo 4/16:Mild LVH, EF 40-45%, diffuse HK //  b. Echo 8/17: EF 35-40%, diffuse HK, diastolic dysfunction, aortic sclerosis, trivial MR, moderate LAE, normal RVSF, moderate RAE, mild TR, PASP 42 mmHg // c. Echo 4/18: Mild concentric LVH, EF 30-35, normal wall motion, grade 1 diastolic dysfunction, PASP 49   Chronic respiratory failure (HCC)    Cluster headache    "hx; haven't had one in awhile" (01/09/2016)   COPD (chronic obstructive pulmonary disease) (HCC)    [name] 01/09/2016   History of CVA (cerebrovascular accident)    Hypertension    IDA (iron deficiency anemia)    Moderate tobacco use disorder    NICM (nonischemic cardiomyopathy) (HCC)    Nicotine addiction    Tobacco abuse     Current Medications, Allergies, Past Surgical History, Family History and Social History were reviewed in [employer] record.   Current Outpatient Medications  Medication Sig Dispense Refill   albuterol (PROVENTIL) (2.5 MG/3ML) 0.083% nebulizer solution Take 3 mLs (2.5 mg total) by nebulization every 4 (four) hours as needed for wheezing or shortness of breath. 75 mL 0   albuterol (VENTOLIN HFA) 108 (90 Base) MCG/ACT inhaler INHALE 2 PUFFS INTO THE LUNGS EVERY 6 (SIX) HOURS AS NEEDED FOR WHEEZING OR SHORTNESS OF BREATH. 18 g 5   atorvastatin (LIPITOR) 80 MG tablet Take 1 tablet (80 mg total) by mouth daily.     bismuth subsalicylate (PEPTO BISMOL) 262 MG chewable tablet Chew 2 tablets (524 mg total) by mouth 4 (four) times daily. 30 tablet 0   Budeson-Glycopyrrol-Formoterol 160-9-4.8 MCG/ACT AERO INHALE 2 PUFFS INTO THE LUNGS 2 (TWO) TIMES DAILY. 10.7 g 11   ferrous sulfate 325 (65 FE) MG tablet Take 1 tablet (325 mg total) by mouth 2 (two) times daily with a meal. 60 tablet 1   furosemide (LASIX) 40 MG tablet Take 1 tablet (40 mg total) by mouth daily.     gabapentin (NEURONTIN) 300 MG capsule Take 1 capsule (300 mg total) by mouth at bedtime.     gabapentin (NEURONTIN) 300 MG capsule TAKE 1 CAPSULE (300 MG TOTAL) BY MOUTH AT BEDTIME. 30 capsule 6   Misc. Devices MISC Inhale 3 L into the  lungs as needed. Portable oxygen concentrator. Diagnosis COPD.     Multiple Vitamin (MULTIVITAMIN WITH MINERALS) TABS tablet Take 1 tablet by mouth daily.     nitroGLYCERIN (NITROSTAT) 0.4 MG SL tablet Place 1 tablet (0.4 mg total) under the tongue every 5 (five) minutes as needed for chest pain.     pantoprazole (PROTONIX) 40 MG tablet Take 1 tablet (40 mg total) by mouth 2 (two) times daily. 30 tablet 1   XARELTO 20 MG TABS tablet Take 20 mg by mouth daily.     No current facility-administered medications for this visit.    Review of Systems: No chest pain. No shortness of breath. No urinary complaints.   PHYSICAL EXAM :    Wt Readings from Last 3 Encounters:  12/05/20 217 lb 3.2 oz (98.5 kg)   11/19/20 225 lb 12 oz (102.4 kg)  11/15/20 224 lb 6.9 oz (101.8 kg)    BP 124/60   Pulse 89   Ht 6\' 3"  (1.905 m)   Wt 212 lb (96.2 kg)   BMI 26.50 kg/m  Constitutional:  Well developed male in no acute distress. Psychiatric: Normal mood and affect. Behavior is normal. EENT: Pupils normal.  Conjunctivae are normal. No scleral icterus. Neck supple.  Cardiovascular: Normal rate, regular rhythm. No edema Pulmonary/chest: Effort normal and breath sounds normal. No wheezing, rales or rhonchi. Abdominal: Soft, nondistended, nontender. Bowel sounds active throughout. There are no masses palpable. No hepatomegaly. Neurological: Alert and oriented to person place and time. Skin: Skin is warm and dry. No rashes noted.  , NP  12/20/2020, 3:45 PM

## 2020-12-21 ENCOUNTER — Other Ambulatory Visit: Payer: Self-pay

## 2020-12-21 ENCOUNTER — Other Ambulatory Visit: Payer: Medicare HMO

## 2020-12-21 ENCOUNTER — Ambulatory Visit (INDEPENDENT_AMBULATORY_CARE_PROVIDER_SITE_OTHER): Payer: Medicare HMO | Admitting: Nurse Practitioner

## 2020-12-21 ENCOUNTER — Encounter: Payer: Self-pay | Admitting: Nurse Practitioner

## 2020-12-21 VITALS — BP 124/60 | HR 89 | Ht 75.0 in | Wt 212.0 lb

## 2020-12-21 DIAGNOSIS — A048 Other specified bacterial intestinal infections: Secondary | ICD-10-CM | POA: Diagnosis not present

## 2020-12-21 DIAGNOSIS — K279 Peptic ulcer, site unspecified, unspecified as acute or chronic, without hemorrhage or perforation: Secondary | ICD-10-CM | POA: Diagnosis not present

## 2020-12-21 MED FILL — Albuterol Sulfate Inhal Aero 108 MCG/ACT (90MCG Base Equiv): RESPIRATORY_TRACT | 25 days supply | Qty: 18 | Fill #0 | Status: AC

## 2020-12-21 MED FILL — Budesonide-Glycopyrrolate-Formoterol Aers 160-9-4.8 MCG/ACT: RESPIRATORY_TRACT | 30 days supply | Qty: 10.7 | Fill #3 | Status: AC

## 2020-12-21 NOTE — Patient Instructions (Signed)
Your provider has requested that you go to the basement level for lab work before leaving today. Press "B" on the elevator. The lab is located at the first door on the left as you exit the elevator.  (Stool Test)  Due to recent changes in healthcare laws, you may see the results of your imaging and laboratory studies on MyChart before your provider has had a chance to review them.  We understand that in some cases there may be results that are confusing or concerning to you. Not all laboratory results come back in the same time frame and the provider may be waiting for multiple results in order to interpret others.  Please give [place] 48 hours in order for your provider to thoroughly review all the results before contacting the office for clarification of your results.    If you are age 88 or older, your body mass index should be between 23-30. Your Body mass index is 26.5 kg/m. If this is out of the aforementioned range listed, please consider follow up with your Primary Care Provider.  If you are age 48 or younger, your body mass index should be between 19-25. Your Body mass index is 26.5 kg/m. If this is out of the aformentioned range listed, please consider follow up with your Primary Care Provider.   __________________________________________________________  The [facility] providers would like to encourage you to use [facility] to communicate with providers for non-urgent requests or questions.  Due to long hold times on the telephone, sending your provider a message by [facility] may be a faster and more efficient way to get a response.  Please allow 48 business hours for a response.  Please remember that this is for non-urgent requests.    I appreciate the  opportunity to care for you  Thank You   Midge Minium

## 2020-12-22 ENCOUNTER — Other Ambulatory Visit: Payer: Self-pay

## 2020-12-22 DIAGNOSIS — K279 Peptic ulcer, site unspecified, unspecified as acute or chronic, without hemorrhage or perforation: Secondary | ICD-10-CM | POA: Diagnosis not present

## 2020-12-22 DIAGNOSIS — A048 Other specified bacterial intestinal infections: Secondary | ICD-10-CM | POA: Diagnosis not present

## 2020-12-23 DIAGNOSIS — I5043 Acute on chronic combined systolic (congestive) and diastolic (congestive) heart failure: Secondary | ICD-10-CM | POA: Diagnosis not present

## 2020-12-23 LAB — HELICOBACTER PYLORI  SPECIAL ANTIGEN
MICRO NUMBER:: 12119658
SPECIMEN QUALITY: ADEQUATE

## 2020-12-26 ENCOUNTER — Other Ambulatory Visit: Payer: Self-pay

## 2020-12-26 MED ORDER — PANTOPRAZOLE SODIUM 40 MG PO TBEC
40.0000 mg | DELAYED_RELEASE_TABLET | Freq: Every day | ORAL | 11 refills | Status: DC
  Filled 2020-12-26: qty 30, 30d supply, fill #0
  Filled 2021-01-25: qty 30, 30d supply, fill #1

## 2020-12-27 ENCOUNTER — Other Ambulatory Visit: Payer: Self-pay

## 2020-12-28 NOTE — Progress Notes (Signed)
____________________________________________________________  Attending physician addendum:  Thank you for sending this case to me. I have reviewed the entire note.  Agree with H. pylori stool antigen to confirm eradication.  If negative, total 8-week course of PPI sufficient. He must completely avoid NSAIDs.  Regarding the incidental biopsy finding of focal intestinal metaplasia without dysplasia, he needs a repeat upper endoscopy in 3 years to take surveillance biopsies.  Repeat CBC and iron studies about 4 weeks from now to decide how to dose his iron.  If oral iron does not bring his iron levels and hemoglobin to normal or near normal in the next few months, then we will refer him back to hematology for consideration of IV iron.   Amada Jupiter, MD  ____________________________________________________________

## 2020-12-29 ENCOUNTER — Other Ambulatory Visit: Payer: Self-pay

## 2020-12-29 ENCOUNTER — Telehealth: Payer: Self-pay

## 2020-12-29 DIAGNOSIS — D500 Iron deficiency anemia secondary to blood loss (chronic): Secondary | ICD-10-CM

## 2020-12-29 NOTE — Telephone Encounter (Signed)
Spoke with the patient and explained the plan of care. He agrees to this plan. He will return to the lab on 01/26/21 for the follow up labs.

## 2020-12-29 NOTE — Telephone Encounter (Signed)
-----   Message from Meredith Pel, NP sent at 12/28/2020  5:39 PM EDT ----- Waynetta Sandy, will you let Mr. Pound know that I spoke with Dr. Myrtie Neither. Mr Mccathern will need  To be on EGD recall list for June 2025 which is 3 years from last EGD. This is for surveillance biopsies given history of focal intestinal metaplasia,   Please have him  come for CBC, ferritin, TIBC in about 4 weeks from now to decide how to dose his iron.  Let him know that if oral iron doesn't keep up his iron levels then we may have to refer him bac to hematology for consideration of IV iron  Thanks

## 2021-01-06 ENCOUNTER — Telehealth: Payer: Self-pay | Admitting: Family Medicine

## 2021-01-06 ENCOUNTER — Ambulatory Visit (INDEPENDENT_AMBULATORY_CARE_PROVIDER_SITE_OTHER): Payer: Medicare HMO | Admitting: Nurse Practitioner

## 2021-01-06 ENCOUNTER — Other Ambulatory Visit: Payer: Self-pay

## 2021-01-06 VITALS — BP 129/77 | HR 101 | Temp 97.9°F | Resp 18 | Ht 75.0 in | Wt 214.0 lb

## 2021-01-06 DIAGNOSIS — Z0000 Encounter for general adult medical examination without abnormal findings: Secondary | ICD-10-CM | POA: Diagnosis not present

## 2021-01-06 NOTE — Patient Instructions (Addendum)
[name] , Thank you for taking time to come for your Medicare Wellness Visit. I appreciate your ongoing commitment to your health goals. Please review the following plan we discussed and let me know if I can assist you in the future.   These are the goals we discussed:  Goals   None     This is a list of the screening recommended for you and due dates:  Health Maintenance  Topic Date Due   Tetanus Vaccine  Never done   COVID-19 Vaccine (4 - Booster for Pfizer series) 07/18/2020   Flu Shot  01/09/2021   Pneumonia vaccines (2 of 2 - PCV13) 12/05/2021   Colon Cancer Screening  09/05/2028   Hepatitis C Screening: USPSTF Recommendation to screen - Ages 18-79 yo.  Completed   Zoster (Shingles) Vaccine  Completed   HPV Vaccine  Aged Out   Critical care medicine: Principles of diagnosis and management in the adult (4th ed., pp. 2706-2376). Saunders."> Miller's anesthesia (8th ed., pp. 232-250). Saunders.">  Advance Directive  Advance directives are legal documents that allow you to make decisions about your health care and medical treatment in case you become unable to communicate for yourself. Advance directives let your wishes be known to family, friends,and health care providers. Discussing and writing advance directives should happen over time rather than all at once. Advance directives can be changed and updated at any time. There are different types of advance directives, such as: Medical power of attorney. Living will. Do not resuscitate (DNR) order or do not attempt resuscitation (DNAR) order. Health care proxy and medical power of attorney A health care proxy is also called a health care agent. This person is appointed to make medical decisions for you when you are unable to make decisions for yourself. Generally, people ask a trusted friend or family member to act as their proxy and represent their preferences. Make sure you have an agreement with your trusted person to act as your  proxy. A proxy may have tomake a medical decision on your behalf if your wishes are not known. A medical power of attorney, also called a durable power of attorney for health care, is a legal document that names your health care proxy. Depending on the laws in your state, the document may need to be: Signed. Notarized. Dated. Copied. Witnessed. Incorporated into your medical record. You may also want to appoint a trusted person to manage your money in the event you are unable to do so. This is called a durable power of attorney for finances. It is a separate legal document from the durable power of attorney for health care. You may choose your health care proxy or someone different toact as your agent in money matters. If you do not appoint a proxy, or there is a concern that the proxy is not acting in your best interest, a court may appoint a guardian to act on yourbehalf. Living will A living will is a set of instructions that state your wishes about medical care when you cannot express them yourself. Health care providers should keep a copy of your living will in your medical record. You may want to give a copy to family members or friends. To alert caregivers in case of an emergency, you can place a card in your wallet to let them know that you have a living will and where they can find it. A living will is used if you become: Terminally ill. Disabled. Unable to communicate or  make decisions. The following decisions should be included in your living will: To use or not to use life support equipment, such as dialysis machines and breathing machines (ventilators). Whether you want a DNR or DNAR order. This tells health care providers not to use cardiopulmonary resuscitation (CPR) if breathing or heartbeat stops. To use or not to use tube feeding. To be given or not to be given food and fluids. Whether you want comfort (palliative) care when the goal becomes comfort rather than a cure. Whether  you want to donate your organs and tissues. A living will does not give instructions for distributing your money andproperty if you should pass away. DNR or DNAR A DNR or DNAR order is a request not to have CPR in the event that your heart stops beating or you stop breathing. If a DNR or DNAR order has not been made and shared, a health care provider will try to help any patient whose heart has stopped or who has stopped breathing. If you plan to have surgery, talk with your health care provider about how your DNR or DNAR order will be followed ifproblems occur. What if I do not have an advance directive? Some states assign family decision makers to act on your behalf if you do not have an advance directive. Each state has its own laws about advance directives. You may want to check with your health care provider, attorney, orstate representative about the laws in your state. Summary Advance directives are legal documents that allow you to make decisions about your health care and medical treatment in case you become unable to communicate for yourself. The process of discussing and writing advance directives should happen over time. You can change and update advance directives at any time. Advance directives may include a medical power of attorney, a living will, and a DNR or DNAR order. This information is not intended to replace advice given to you by your health care provider. Make sure you discuss any questions you have with your healthcare provider. Document Revised: 03/01/2020 Document Reviewed: 03/01/2020 Elsevier Patient Education  2022 [employer]  Fall Prevention in the Home, Adult Falls can cause injuries and can happen to people of all ages. There are many things you can do to make your home safe and to help prevent falls. Ask forhelp when making these changes. What actions can I take to prevent falls? General Instructions Use good lighting in all rooms. Replace any light bulbs that  burn out. Turn on the lights in dark areas. Use night-lights. Keep items that you use often in easy-to-reach places. Lower the shelves around your home if needed. Set up your furniture so you have a clear path. Avoid moving your furniture around. Do not have throw rugs or other things on the floor that can make you trip. Avoid walking on wet floors. If any of your floors are uneven, fix them. Add color or contrast paint or tape to clearly mark and help you see: Grab bars or handrails. First and last steps of staircases. Where the edge of each step is. If you use a stepladder: Make sure that it is fully opened. Do not climb a closed stepladder. Make sure the sides of the stepladder are locked in place. Ask someone to hold the stepladder while you use it. Know where your pets are when moving through your home. What can I do in the bathroom?     Keep the floor dry. Clean up any water on the floor  right away. Remove soap buildup in the tub or shower. Use nonskid mats or decals on the floor of the tub or shower. Attach bath mats securely with double-sided, nonslip rug tape. If you need to sit down in the shower, use a plastic, nonslip stool. Install grab bars by the toilet and in the tub and shower. Do not use towel bars as grab bars. What can I do in the bedroom? Make sure that you have a light by your bed that is easy to reach. Do not use any sheets or blankets for your bed that hang to the floor. Have a firm chair with side arms that you can use for support when you get dressed. What can I do in the kitchen? Clean up any spills right away. If you need to reach something above you, use a step stool with a grab bar. Keep electrical cords out of the way. Do not use floor polish or wax that makes floors slippery. What can I do with my stairs? Do not leave any items on the stairs. Make sure that you have a light switch at the top and the bottom of the stairs. Make sure that there are  handrails on both sides of the stairs. Fix handrails that are broken or loose. Install nonslip stair treads on all your stairs. Avoid having throw rugs at the top or bottom of the stairs. Choose a carpet that does not hide the edge of the steps on the stairs. Check carpeting to make sure that it is firmly attached to the stairs. Fix carpet that is loose or worn. What can I do on the outside of my home? Use bright outdoor lighting. Fix the edges of walkways and driveways and fix any cracks. Remove anything that might make you trip as you walk through a door, such as a raised step or threshold. Trim any bushes or trees on paths to your home. Check to see if handrails are loose or broken and that both sides of all steps have handrails. Install guardrails along the edges of any raised decks and porches. Clear paths of anything that can make you trip, such as tools or rocks. Have leaves, snow, or ice cleared regularly. Use sand or salt on paths during winter. Clean up any spills in your garage right away. This includes grease or oil spills. What other actions can I take? Wear shoes that: Have a low heel. Do not wear high heels. Have rubber bottoms. Feel good on your feet and fit well. Are closed at the toe. Do not wear open-toe sandals. Use tools that help you move around if needed. These include: Canes. Walkers. Scooters. Crutches. Review your medicines with your doctor. Some medicines can make you feel dizzy. This can increase your chance of falling. Ask your doctor what else you can do to help prevent falls. Where to find more information Centers for Disease Control and Prevention, [employer]: FootballExhibition.com.br [employer] on Aging: https://walker.com/ Contact a doctor if: You are afraid of falling at home. You feel weak, drowsy, or dizzy at home. You fall at home. Summary There are many simple things that you can do to make your home safe and to help prevent falls. Ways to make your  home safe include removing things that can make you trip and installing grab bars in the bathroom. Ask for help when making these changes in your home. This information is not intended to replace advice given to you by your health care provider. Make sure you  discuss any questions you have with your healthcare provider. Document Revised: 12/30/2019 Document Reviewed: 12/30/2019 Elsevier Patient Education  2022 Elsevier Inc.  Health Maintenance, Male Adopting a healthy lifestyle and getting preventive care are important in promoting health and wellness. Ask your health care provider about: The right schedule for you to have regular tests and exams. Things you can do on your own to prevent diseases and keep yourself healthy. What should I know about diet, weight, and exercise? Eat a healthy diet  Eat a diet that includes plenty of vegetables, fruits, low-fat dairy products, and lean protein. Do not eat a lot of foods that are high in solid fats, added sugars, or sodium.  Maintain a healthy weight Body mass index (BMI) is a measurement that can be used to identify possible weight problems. It estimates body fat based on height and weight. Your health care provider can help determine your BMI and help you achieve or maintain ahealthy weight. Get regular exercise Get regular exercise. This is one of the most important things you can do for your health. Most adults should: Exercise for at least 150 minutes each week. The exercise should increase your heart rate and make you sweat (moderate-intensity exercise). Do strengthening exercises at least twice a week. This is in addition to the moderate-intensity exercise. Spend less time sitting. Even light physical activity can be beneficial. Watch cholesterol and blood lipids Have your blood tested for lipids and cholesterol at 67 years of age, then havethis test every 5 years. You may need to have your cholesterol levels checked more often if: Your  lipid or cholesterol levels are high. You are older than 67 years of age. You are at high risk for heart disease. What should I know about cancer screening? Many types of cancers can be detected early and may often be prevented. Depending on your health history and family history, you may need to have cancer screening at various ages. This may include screening for: Colorectal cancer. Prostate cancer. Skin cancer. Lung cancer. What should I know about heart disease, diabetes, and high blood pressure? Blood pressure and heart disease High blood pressure causes heart disease and increases the risk of stroke. This is more likely to develop in people who have high blood pressure readings, are of African descent, or are overweight. Talk with your health care provider about your target blood pressure readings. Have your blood pressure checked: Every 3-5 years if you are 69-87 years of age. Every year if you are 36 years old or older. If you are between the ages of 66 and 26 and are a current or former smoker, ask your health care provider if you should have a one-time screening for abdominal aortic aneurysm (AAA). Diabetes Have regular diabetes screenings. This checks your fasting blood sugar level. Have the screening done: Once every three years after age 64 if you are at a normal weight and have a low risk for diabetes. More often and at a younger age if you are overweight or have a high risk for diabetes. What should I know about preventing infection? Hepatitis B If you have a higher risk for hepatitis B, you should be screened for this virus. Talk with your health care provider to find out if you are at risk forhepatitis B infection. Hepatitis C Blood testing is recommended for: Everyone born from [birth year] through 1967. Anyone with known risk factors for hepatitis C. Sexually transmitted infections (STIs) You should be screened each year for STIs, including gonorrhea  and chlamydia, if: You  are sexually active and are younger than 67 years of age. You are older than 67 years of age and your health care provider tells you that you are at risk for this type of infection. Your sexual activity has changed since you were last screened, and you are at increased risk for chlamydia or gonorrhea. Ask your health care provider if you are at risk. Ask your health care provider about whether you are at high risk for HIV. Your health care provider may recommend a prescription medicine to help prevent HIV infection. If you choose to take medicine to prevent HIV, you should first get tested for HIV. You should then be tested every 3 months for as long as you are taking the medicine. Follow these instructions at home: Lifestyle Do not use any products that contain nicotine or tobacco, such as cigarettes, e-cigarettes, and chewing tobacco. If you need help quitting, ask your health care provider. Do not use street drugs. Do not share needles. Ask your health care provider for help if you need support or information about quitting drugs. Alcohol use Do not drink alcohol if your health care provider tells you not to drink. If you drink alcohol: Limit how much you have to 0-2 drinks a day. Be aware of how much alcohol is in your drink. In the [place] one drink equals one 12 oz bottle of beer (355 mL), one 5 oz glass of wine (148 mL), or one 1 oz glass of hard liquor (44 mL). General instructions Schedule regular health, dental, and eye exams. Stay current with your vaccines. Tell your health care provider if: You often feel depressed. You have ever been abused or do not feel safe at home. Summary Adopting a healthy lifestyle and getting preventive care are important in promoting health and wellness. Follow your health care provider's instructions about healthy diet, exercising, and getting tested or screened for diseases. Follow your health care provider's instructions on monitoring your cholesterol  and blood pressure. This information is not intended to replace advice given to you by your health care provider. Make sure you discuss any questions you have with your healthcare provider. Document Revised: 05/21/2018 Document Reviewed: 05/21/2018 Elsevier Patient Education  2022 Elsevier Inc.  Steps to Quit Smoking Smoking tobacco is the leading cause of preventable death. It can affect almost every organ in the body. Smoking puts you and people around you at risk for many serious, long-lasting (chronic) diseases. Quitting smoking can be hard, but it is one of the best things thatyou can do for your health. It is never too late to quit. How do I get ready to quit? When you decide to quit smoking, make a plan to help you succeed. Before you quit: Pick a date to quit. Set a date within the next 2 weeks to give you time to prepare. Write down the reasons why you are quitting. Keep this list in places where you will see it often. Tell your family, friends, and co-workers that you are quitting. Their support is important. Talk with your doctor about the choices that may help you quit. Find out if your health insurance will pay for these treatments. Know the people, places, things, and activities that make you want to smoke (triggers). Avoid them. What first steps can I take to quit smoking? Throw away all cigarettes at home, at work, and in your car. Throw away the things that you use when you smoke, such as ashtrays and lighters.  Clean your car. Make sure to empty the ashtray. Clean your home, including curtains and carpets. What can I do to help me quit smoking? Talk with your doctor about taking medicines and seeing a counselor at the same time. You are more likely to succeed when you do both. If you are pregnant or breastfeeding, talk with your doctor about counseling or other ways to quit smoking. Do not take medicine to help you quit smoking unless your doctor tells you to do so. To quit  smoking: Quit right away Quit smoking totally, instead of slowly cutting back on how much you smoke over a period of time. Go to counseling. You are more likely to quit if you go to counseling sessions regularly. Take medicine You may take medicines to help you quit. Some medicines need a prescription, and some you can buy over-the-counter. Some medicines may contain a drug called nicotine to replace the nicotine in cigarettes. Medicines may: Help you to stop having the desire to smoke (cravings). Help to stop the problems that come when you stop smoking (withdrawal symptoms). Your doctor may ask you to use: Nicotine patches, gum, or lozenges. Nicotine inhalers or sprays. Non-nicotine medicine that is taken by mouth. Find resources Find resources and other ways to help you quit smoking and remain smoke-free after you quit. These resources are most helpful when you use them often. They include: Online chats with a Veterinary surgeon. Phone quitlines. Printed Materials engineer. Support groups or group counseling. Text messaging programs. Mobile phone apps. Use apps on your mobile phone or tablet that can help you stick to your quit plan. There are many free apps for mobile phones and tablets as well as websites. Examples include Quit Guide from the [employer] and smokefree.gov  What things can I do to make it easier to quit?  Talk to your family and friends. Ask them to support and encourage you. Call a phone quitline (1-800-QUIT-NOW), reach out to support groups, or work with a Veterinary surgeon. Ask people who smoke to not smoke around you. Avoid places that make you want to smoke, such as: Bars. Parties. Smoke-break areas at work. Spend time with people who do not smoke. Lower the stress in your life. Stress can make you want to smoke. Try these things to help your stress: Getting regular exercise. Doing deep-breathing exercises. Doing yoga. Meditating. Doing a body scan. To do this, close your eyes,  focus on one area of your body at a time from head to toe. Notice which parts of your body are tense. Try to relax the muscles in those areas. How will I feel when I quit smoking? Day 1 to 3 weeks Within the first 24 hours, you may start to have some problems that come from quitting tobacco. These problems are very bad 2-3 days after you quit, but they do not often last for more than 2-3 weeks. You may get these symptoms: Mood swings. Feeling restless, nervous, angry, or annoyed. Trouble concentrating. Dizziness. Strong desire for high-sugar foods and nicotine. Weight gain. Trouble pooping (constipation). Feeling like you may vomit (nausea). Coughing or a sore throat. Changes in how the medicines that you take for other issues work in your body. Depression. Trouble sleeping (insomnia). Week 3 and afterward After the first 2-3 weeks of quitting, you may start to notice more positive results, such as: Better sense of smell and taste. Less coughing and sore throat. Slower heart rate. Lower blood pressure. Clearer skin. Better breathing. Fewer sick days. Quitting smoking  can be hard. Do not give up if you fail the first time. Some people need to try a few times before they succeed. Do your best to stick to your quit plan, and talk with yourdoctor if you have any questions or concerns. Summary Smoking tobacco is the leading cause of preventable death. Quitting smoking can be hard, but it is one of the best things that you can do for your health. When you decide to quit smoking, make a plan to help you succeed. Quit smoking right away, not slowly over a period of time. When you start quitting, seek help from your doctor, family, or friends. This information is not intended to replace advice given to you by your health care provider. Make sure you discuss any questions you have with your healthcare provider. Document Revised: 02/20/2019 Document Reviewed: 08/16/2018 Elsevier Patient  Education  2022 [employer].

## 2021-01-06 NOTE — Telephone Encounter (Signed)
Received a message from [employer]. Patient had an [facility] appt at [facility]. Upon arrival patient was excepting to meet with a social worker to discuss options for assisted living. He has memory issues, so asked that our office call him to schedule an appointment so his family can determine next steps. Would you be able to assist with this patient

## 2021-01-06 NOTE — Progress Notes (Signed)
Subjective:   Zylen Wenig is a 65 y.o. male who presents for Medicare Annual/Subsequent preventive examination.  Review of Systems    Review of Systems  Constitutional: Negative.   HENT: Negative.    Eyes: Negative.   Respiratory: Negative.    Cardiovascular: Negative.   Gastrointestinal: Negative.   Genitourinary: Negative.   Musculoskeletal: Negative.   Skin: Negative.   Neurological: Negative.   Endo/Heme/Allergies: Negative.   Psychiatric/Behavioral: Negative.     Cardiac Risk Factors include: advanced age (>44men, >19 women);male gender;hypertension;dyslipidemia;sedentary lifestyle;smoking/ tobacco exposure     Objective:    Today's Vitals   01/06/21 0852  BP: 129/77  Pulse: (!) 101  Resp: 18  Temp: 97.9 F (36.6 C)  SpO2: 90%  Weight: 214 lb (97.1 kg)  Height: 6\' 3"  (1.905 m)   Body mass index is 26.75 kg/m.  Advanced Directives 01/06/2021 11/17/2020 11/11/2020 10/07/2020 09/17/2019 06/15/2019 06/11/2019  Does Patient Have a Medical Advance Directive? No No No Yes No - No  Would patient like information on creating a medical advance directive? No - Patient declined No - Patient declined No - Patient declined - No - Patient declined No - Patient declined No - Patient declined  Pre-existing out of facility DNR order (yellow form or pink MOST form) - - - - - - -    Current Medications (verified) Outpatient Encounter Medications as of 01/06/2021  Medication Sig   albuterol (PROVENTIL) (2.5 MG/3ML) 0.083% nebulizer solution Take 3 mLs (2.5 mg total) by nebulization every 4 (four) hours as needed for wheezing or shortness of breath.   albuterol (VENTOLIN HFA) 108 (90 Base) MCG/ACT inhaler INHALE 2 PUFFS INTO THE LUNGS EVERY 6 (SIX) HOURS AS NEEDED FOR WHEEZING OR SHORTNESS OF BREATH.   atorvastatin (LIPITOR) 80 MG tablet TAKE 1 TABLET EVERY DAY   Budeson-Glycopyrrol-Formoterol 160-9-4.8 MCG/ACT AERO INHALE 2 PUFFS INTO THE LUNGS 2 (TWO) TIMES DAILY.   ferrous sulfate 325  (65 FE) MG tablet Take 1 tablet (325 mg total) by mouth 2 (two) times daily with a meal.   furosemide (LASIX) 40 MG tablet TAKE 1 TABLET EVERY DAY   gabapentin (NEURONTIN) 300 MG capsule TAKE 1 CAPSULE (300 MG TOTAL) BY MOUTH AT BEDTIME.   Misc. Devices MISC Inhale 3 L into the lungs as needed. Portable oxygen concentrator. Diagnosis COPD.   Multiple Vitamin (MULTIVITAMIN WITH MINERALS) TABS tablet Take 1 tablet by mouth daily.   nitroGLYCERIN (NITROSTAT) 0.4 MG SL tablet Place 1 tablet (0.4 mg total) under the tongue every 5 (five) minutes as needed for chest pain.   pantoprazole (PROTONIX) 40 MG tablet Take 1 tablet (40 mg total) by mouth daily before breakfast.   XARELTO 20 MG TABS tablet TAKE 1 TABLET EVERY DAY WITH SUPPER   [DISCONTINUED] TUDORZA PRESSAIR 400 MCG/ACT AEPB INHALE 1 PUFF INTO THE LUNGS IN THE MORNING AND AT BEDTIME.   No facility-administered encounter medications on file as of 01/06/2021.    Allergies (verified) Lisinopril   History: Past Medical History:  Diagnosis Date   AVM (arteriovenous malformation)    CAD (coronary artery disease)    a. LHC 5/12:  LAD 20, pLCx 20, pRCA 40, dRCA 40, EF 35%, diff HK  //  b. Myoview 4/16: Overall Impression:  High risk stress nuclear study There is no evidence of ischemia.  There is severe LV dysfunction. LV Ejection Fraction: 30%.  LV Wall Motion:  There is global LV hypokinesis.     CAP (community acquired pneumonia) 09/2013  Chronic combined systolic and diastolic CHF (congestive heart failure) (HCC)    a. Echo 4/16:Mild LVH, EF 40-45%, diffuse HK //  b. Echo 8/17: EF 35-40%, diffuse HK, diastolic dysfunction, aortic sclerosis, trivial MR, moderate LAE, normal RVSF, moderate RAE, mild TR, PASP 42 mmHg // c. Echo 4/18: Mild concentric LVH, EF 30-35, normal wall motion, grade 1 diastolic dysfunction, PASP 49   Chronic respiratory failure (HCC)    Cluster headache    "hx; haven't had one in awhile" (01/09/2016)   COPD (chronic  obstructive pulmonary disease) (HCC)    [name] 01/09/2016   History of CVA (cerebrovascular accident)    Hypertension    IDA (iron deficiency anemia)    Moderate tobacco use disorder    NICM (nonischemic cardiomyopathy) (HCC)    Nicotine addiction    Tobacco abuse    Past Surgical History:  Procedure Laterality Date   BIOPSY  11/12/2020   Procedure: BIOPSY;  Surgeon: Lemar Lofty., MD;  Location: [facility] ENDOSCOPY;  Service: Gastroenterology;;   CARDIAC CATHETERIZATION  10/2010   LM normal, LAD with 20% irregularities, LCX with 20%, RCA with 40% prox and 40% distal - EF of 35%   CATARACT EXTRACTION, BILATERAL     COLONOSCOPY W/ BIOPSIES AND POLYPECTOMY     COLONOSCOPY WITH PROPOFOL N/A 09/06/2018   Procedure: COLONOSCOPY WITH PROPOFOL;  Surgeon: Tressia Danas, MD;  Location: [facility] ENDOSCOPY;  Service: Gastroenterology;  Laterality: N/A;   ENTEROSCOPY N/A 09/28/2018   Procedure: ENTEROSCOPY;  Surgeon: Jeani Hawking, MD;  Location: [facility] ENDOSCOPY;  Service: Endoscopy;  Laterality: N/A;   ENTEROSCOPY N/A 10/28/2018   Procedure: ENTEROSCOPY;  Surgeon: Tressia Danas, MD;  Location: [facility] ENDOSCOPY;  Service: Gastroenterology;  Laterality: N/A;   ENTEROSCOPY N/A 10/09/2020   Procedure: ENTEROSCOPY;  Surgeon: Hilarie Fredrickson, MD;  Location: [facility] ENDOSCOPY;  Service: Endoscopy;  Laterality: N/A;   ESOPHAGOGASTRODUODENOSCOPY N/A 11/12/2020   Procedure: ESOPHAGOGASTRODUODENOSCOPY (EGD);  Surgeon: Lemar Lofty., MD;  Location: [facility] ENDOSCOPY;  Service: Gastroenterology;  Laterality: N/A;   ESOPHAGOGASTRODUODENOSCOPY (EGD) WITH PROPOFOL N/A 09/05/2018   Procedure: ESOPHAGOGASTRODUODENOSCOPY (EGD) WITH PROPOFOL;  Surgeon: Benancio Deeds, MD;  Location: [facility] ENDOSCOPY;  Service: Gastroenterology;  Laterality: N/A;   ESOPHAGOGASTRODUODENOSCOPY (EGD) WITH PROPOFOL N/A 11/19/2020   Procedure: ESOPHAGOGASTRODUODENOSCOPY (EGD) WITH PROPOFOL;  Surgeon: Beverley Fiedler, MD;  Location: [facility] ENDOSCOPY;  Service:  Gastroenterology;  Laterality: N/A;   EXCISION MASS HEAD     GIVENS CAPSULE STUDY N/A 09/06/2018   Procedure: GIVENS CAPSULE STUDY;  Surgeon: Tressia Danas, MD;  Location: [facility] ENDOSCOPY;  Service: Gastroenterology;  Laterality: N/A;   GIVENS CAPSULE STUDY N/A 09/26/2018   Procedure: GIVENS CAPSULE STUDY;  Surgeon: Beverley Fiedler, MD;  Location: [facility] ENDOSCOPY;  Service: Gastroenterology;  Laterality: N/A;   GIVENS CAPSULE STUDY N/A 06/14/2019   Procedure: GIVENS CAPSULE STUDY;  Surgeon: Napoleon Form, MD;  Location: [facility] ENDOSCOPY;  Service: Endoscopy;  Laterality: N/A;   HEMOSTASIS CLIP PLACEMENT  11/12/2020   Procedure: HEMOSTASIS CLIP PLACEMENT;  Surgeon: Lemar Lofty., MD;  Location: [facility] ENDOSCOPY;  Service: Gastroenterology;;   HEMOSTASIS CONTROL  11/12/2020   Procedure: HEMOSTASIS CONTROL;  Surgeon: Lemar Lofty., MD;  Location: [facility] ENDOSCOPY;  Service: Gastroenterology;;   HOT HEMOSTASIS N/A 10/28/2018   Procedure: HOT HEMOSTASIS (ARGON PLASMA COAGULATION/BICAP);  Surgeon: Tressia Danas, MD;  Location: [facility] ENDOSCOPY;  Service: Gastroenterology;  Laterality: N/A;   HOT HEMOSTASIS N/A 11/12/2020   Procedure: HOT HEMOSTASIS (ARGON PLASMA COAGULATION/BICAP);  Surgeon: Lemar Lofty., MD;  Location: [facility] ENDOSCOPY;  Service: Gastroenterology;  Laterality: N/A;   INCISION AND DRAINAGE PERIRECTAL ABSCESS N/A 06/05/2017   Procedure: IRRIGATION AND DEBRIDEMENT PERIRECTAL ABSCESS;  Surgeon: Andria MeuseWhite, Christopher M, MD;  Location: [facility] OR;  Service: General;  Laterality: N/A;   SUBMUCOSAL TATTOO INJECTION  11/12/2020   Procedure: SUBMUCOSAL TATTOO INJECTION;  Surgeon: Lemar LoftyMansouraty, Gabriel Jr., MD;  [facility];  Service: Gastroenterology;;   VIDEO BRONCHOSCOPY Bilateral 05/08/2016   Procedure: VIDEO BRONCHOSCOPY WITH FLUORO;  Surgeon: Oretha Milchakesh V Alva, MD;  [facility];  Service: Cardiopulmonary;  Laterality: Bilateral;   Family History  Problem Relation Age of  Onset   Heart disease Mother    Diabetes Mother    Colon cancer Mother    Liver cancer Mother    Cancer Father        type unknown   Diabetes Sister        x 2   Diabetes Brother    Social History   Socioeconomic History   Marital status: Single    Spouse name: Not on file   Number of children: 1   Years of education: Not on file   Highest education level: Not on file  Occupational History   Occupation: retired  Tobacco Use   Smoking status: Some Days    Packs/day: 2.00    Years: 47.00    Pack years: 94.00    Types: Cigarettes   Smokeless tobacco: Never  Vaping Use   Vaping Use: Never used  Substance and Sexual Activity   Alcohol use: Not Currently    Alcohol/week: 0.0 standard drinks    Comment: last drink was before xmas   Drug use: No    Types: Cocaine, Marijuana    Comment: "nothing in 20 years"   Sexual activity: Not Currently  Other Topics Concern   Not on file  Social History Narrative   unemployed   Social Determinants of Health   Financial Resource Strain: Low Risk    Difficulty of Paying Living Expenses: Not very hard  Food Insecurity: No Food Insecurity   Worried About Programme researcher, broadcasting/film/videounning Out of Food in the Last Year: Never true   Ran Out of Food in the Last Year: Never true  Transportation Needs: No Transportation Needs   Lack of Transportation (Medical): No   Lack of Transportation (Non-Medical): No  Physical Activity: Inactive   Days of Exercise per Week: 0 days   Minutes of Exercise per Session: 0 min  Stress: No Stress Concern Present   Feeling of Stress : Not at all  Social Connections: Moderately Integrated   Frequency of Communication with Friends and Family: More than three times a week   Frequency of Social Gatherings with Friends and Family: Once a week   Attends Religious Services: More than 4 times per year   Active Member of [employer]ubs or Organizations: Yes   Attends Engineer, structuralClub or Organization Meetings: More than 4 times per year   Marital Status:  Divorced    Tobacco Counseling Ready to quit: No Counseling given: Yes   Clinical Intake:  Pre-visit preparation completed: No  Pain : No/denies pain     BMI - recorded: 26.75 Nutritional Status: BMI 25 -29 Overweight Nutritional Risks: None Diabetes: No  How often do you need to have someone help you when you read instructions, pamphlets, or other written materials from your doctor or pharmacy?: 1 - Never What is the last grade level you completed in school?: 12th   Diabetic? no  Interpreter Needed?:  No      Activities of Daily Living In your present state of health, do you have any difficulty performing the following activities: 01/06/2021 11/18/2020  Hearing? N -  Vision? N -  Difficulty concentrating or making decisions? N -  Walking or climbing stairs? Y -  Dressing or bathing? N -  Doing errands, shopping? N N  Preparing Food and eating ? N -  Using the Toilet? N -  In the past six months, have you accidently leaked urine? N -  Do you have problems with loss of bowel control? N -  Managing your Medications? N -  Managing your Finances? N -  Housekeeping or managing your Housekeeping? N -  Some recent data might be hidden    Patient Care Team: [name], MD as PCP - General (Family Medicine) Pricilla Riffle, MD as PCP - Cardiology (Cardiology)  Indicate any recent Medical Services you may have received from other than [facility] providers in the past year (date may be approximate).     Assessment:   This is a routine wellness examination for Lonnie.   Dietary issues and exercise activities discussed: Current Exercise Habits: The patient does not participate in regular exercise at present, Exercise limited by: respiratory conditions(s)   Goals Addressed   None    Depression Screen PHQ 2/9 Scores 01/06/2021 12/05/2020 06/14/2020 03/19/2019 03/10/2019 06/18/2018 05/15/2018  PHQ - 2 Score 0 0 0 0 0 - 0  PHQ- 9 Score - 4 2 - - - -  Exception Documentation - -  - - - Patient refusal -  Not completed - - - - - - -    Fall Risk Fall Risk  01/06/2021 12/05/2020 06/14/2020 06/23/2019 05/18/2019  Falls in the past year? 1 0 0 0 0  Number falls in past yr: 1 0 0 0 -  Injury with Fall? 0 0 0 0 -  Risk for fall due to : Other (Comment);Impaired balance/gait - - - -  Risk for fall due to: Comment O2 dropping - - - -  Follow up Education provided - - - -    FALL RISK PREVENTION PERTAINING TO THE HOME:  Any stairs in or around the home? No  If so, are there any without handrails? No  Home free of loose throw rugs in walkways, pet beds, electrical cords, etc? Yes  Adequate lighting in your home to reduce risk of falls? Yes   ASSISTIVE DEVICES UTILIZED TO PREVENT FALLS:  Life alert? Yes  Use of a cane, walker or w/c? No  Grab bars in the bathroom? Yes  Shower chair or bench in shower? No  Elevated toilet seat or a handicapped toilet? No   TIMED UP AND GO:  Was the test performed? Yes .  Length of time to ambulate 10 feet: 4 sec.   Gait steady and fast without use of assistive device  Cognitive Function:        Immunizations Immunization History  Administered Date(s) Administered   Fluad Quad(high Dose 65+) 03/09/2019   Influenza,inj,Quad PF,6+ Mos 03/20/2016, 05/02/2018, 03/24/2020   Influenza-Unspecified 03/28/2017   PFIZER(Purple Top)SARS-COV-2 Vaccination 08/12/2019, 09/10/2019, 03/17/2020   PNEUMOCOCCAL CONJUGATE-20 12/05/2020   Pneumococcal Polysaccharide-23 01/10/2016, 05/02/2018, 05/18/2019   Zoster Recombinat (Shingrix) 05/18/2019, 07/20/2019    TDAP status: Due, Education has been provided regarding the importance of this vaccine. Advised may receive this vaccine at local pharmacy or Health Dept. Aware to provide a copy of the vaccination record if obtained from local  pharmacy or Health Dept. Verbalized acceptance and understanding.  Flu Vaccine status: Up to date  Pneumococcal vaccine status: Up to date  Covid-19 vaccine  status: Completed vaccines  Qualifies for Shingles Vaccine? No   Zostavax completed Yes   Shingrix Completed?: Yes  Screening Tests Health Maintenance  Topic Date Due   TETANUS/TDAP  Never done   COVID-19 Vaccine (4 - Booster for Pfizer series) 07/18/2020   INFLUENZA VACCINE  01/09/2021   PNA vac Low Risk Adult (2 of 2 - PCV13) 12/05/2021   COLONOSCOPY (Pts 45-72yrs Insurance coverage will need to be confirmed)  09/05/2028   Hepatitis C Screening  Completed   Zoster Vaccines- Shingrix  Completed   HPV VACCINES  Aged Out    Health Maintenance  Health Maintenance Due  Topic Date Due   TETANUS/TDAP  Never done   COVID-19 Vaccine (4 - Booster for Pfizer series) 07/18/2020    Colorectal cancer screening: Type of screening: Colonoscopy. Completed  . Repeat every 10 years  Lung Cancer Screening: (Low Dose CT Chest recommended if Age 49-80 years, 30 pack-year currently smoking OR have quit w/in 15years.) does qualify.   Lung Cancer Screening Referral: order has been placed  Additional Screening:  Hepatitis C Screening: does qualify; Completed   Vision Screening: Recommended annual ophthalmology exams for early detection of glaucoma and other disorders of the eye. Is the patient up to date with their annual eye exam?  Yes  Who is the provider or what is the name of the office in which the patient attends annual eye exams? Dr. Dione Booze If pt is not established with a provider, would they like to be referred to a provider to establish care? No .   Dental Screening: Recommended annual dental exams for proper oral hygiene  Community Resource Referral / Chronic Care Management: CRR required this visit?  No   CCM required this visit?  No      Plan:     I have personally reviewed and noted the following in the patient's chart:   Medical and social history Use of alcohol, tobacco or illicit drugs  Current medications and supplements including opioid prescriptions. Patient is not  currently taking opioid prescriptions. Functional ability and status Nutritional status Physical activity Advanced directives List of other physicians Hospitalizations, surgeries, and ER visits in previous 12 months Vitals Screenings to include cognitive, depression, and falls Referrals and appointments  In addition, I have reviewed and discussed with patient certain preventive protocols, quality metrics, and best practice recommendations. A written personalized care plan for preventive services as well as general preventive health recommendations were provided to patient.     Ivonne Andrew, NP   01/06/2021

## 2021-01-09 ENCOUNTER — Other Ambulatory Visit: Payer: Self-pay | Admitting: *Deleted

## 2021-01-09 DIAGNOSIS — F17210 Nicotine dependence, cigarettes, uncomplicated: Secondary | ICD-10-CM

## 2021-01-09 DIAGNOSIS — Z87891 Personal history of nicotine dependence: Secondary | ICD-10-CM

## 2021-01-10 ENCOUNTER — Ambulatory Visit: Payer: Self-pay

## 2021-01-10 NOTE — Telephone Encounter (Signed)
Okay to schedule a virtual

## 2021-01-10 NOTE — Telephone Encounter (Signed)
Will route to PCP for review.  Does patient need to be seen?

## 2021-01-10 NOTE — Telephone Encounter (Signed)
Pt has been scheduled for tomorrow at 810

## 2021-01-10 NOTE — Telephone Encounter (Signed)
Pt. States he is on Xarelto and has a mild nose bleed x 1 week. Has a nose bleed once a day. Mild, "just using one tissue to stop the bleeding." Has had a runny nose. Also feels like he has water in his ears. Asking for advice.   Answer Assessment - Initial Assessment Questions 1. AMOUNT OF BLEEDING: "How bad is the bleeding?" "How much blood was lost?" "Has the bleeding stopped?"   - MILD: needed a couple tissues   - MODERATE: needed many tissues   - SEVERE: large blood clots, soaked many tissues, lasted more than 30 minutes      Mild 2. ONSET: "When did the nosebleed start?"      1 week ago 3. FREQUENCY: "How many nosebleeds have you had in the last 24 hours?"      One a day 4. RECURRENT SYMPTOMS: "Have there been other recent nosebleeds?" If Yes, ask: "How long did it take you to stop the bleeding?" "What worked best?"      No 5. CAUSE: "What do you think caused this nosebleed?"     Blood thinner 6. LOCAL FACTORS: "Do you have any cold symptoms?", "Have you been rubbing or picking at your nose?"     Runny nose 7. SYSTEMIC FACTORS: "Do you have high blood pressure or any bleeding problems?"     Yes 8. BLOOD THINNERS: "Do you take any blood thinners?" (e.g., aspirin, clopidogrel / Plavix, coumadin, heparin). Notes: Other strong blood thinners include: Arixtra (fondaparinux), Eliquis (apixaban), Pradaxa (dabigatran), and Xarelto (rivaroxaban).     Xarelto 9. OTHER SYMPTOMS: "Do you have any other symptoms?" (e.g., lightheadedness)     Feels like he has water in his ears 10. PREGNANCY: "Is there any chance you are pregnant?" "When was your last menstrual period?"       N/A  Protocols used: Nosebleed-A-AH

## 2021-01-10 NOTE — Telephone Encounter (Signed)
I shared they would possibly here from both [place]. I was able to speak with Jared [name] and his sister Jared [name] today separately. The patient would like to be in assisted living facility and his sister would prefer he be in own space and receive PCS in home.    After speaking with Jared [name], he shared "I need help with cooking meals and cleaning up. I think I need to be in assisted living facility and my energy levels are low." Patient shared he wants to be in a assisted living facility as soon as possible.  He did share he is in a boarding home and shares living space with him and does not have private space for food.  I spoke with his sister JaredRichmond, she shared he could benefit from a improved living situation with his own space. She shared with his COPD, being on oxygen, low iron and energy this "should get him some help." I did share with his sister having assistance in his own space and being in assisted living are different. She shared she does the food shopping for him and assisting him with transportation to medical appointments. She mentioned while on the phone, that people often ask her why she doesn't just move her brother in with her since she already does so much for him. She shared she recently lost her husband from terminal illness and knows the challenges with being a long term care giver and she is not in a position to take that own by herself.

## 2021-01-11 ENCOUNTER — Encounter: Payer: Self-pay | Admitting: Family Medicine

## 2021-01-11 ENCOUNTER — Ambulatory Visit: Payer: Medicare HMO | Attending: Family Medicine | Admitting: Family Medicine

## 2021-01-11 ENCOUNTER — Other Ambulatory Visit: Payer: Self-pay

## 2021-01-11 DIAGNOSIS — R040 Epistaxis: Secondary | ICD-10-CM | POA: Diagnosis not present

## 2021-01-11 MED ORDER — FLUTICASONE PROPIONATE 50 MCG/ACT NA SUSP
1.0000 | Freq: Every day | NASAL | 1 refills | Status: DC
  Filled 2021-01-11: qty 16, 30d supply, fill #0

## 2021-01-11 MED ORDER — CETIRIZINE HCL 10 MG PO TABS
10.0000 mg | ORAL_TABLET | Freq: Every day | ORAL | 1 refills | Status: DC
  Filled 2021-01-11: qty 30, 30d supply, fill #0

## 2021-01-11 NOTE — Progress Notes (Signed)
Virtual Visit via Telephone Note  I connected with Jared [name], on 01/11/2021 at 8:17 AM by telephone due to the COVID-19 pandemic and verified that I am speaking with the correct person using two identifiers.   Consent: I discussed the limitations, risks, security and privacy concerns of performing an evaluation and management service by telephone and the availability of in person appointments. I also discussed with the patient that there may be a patient responsible charge related to this service. The patient expressed understanding and agreed to proceed.   Location of Patient: Home  Location of Provider: Clinic   Persons participating in Telemedicine visit: [facility] Dr. Alvis Lemmings     History of Present Illness: Jared [name]  is a 65 year old male with a history of hypertension, COPD, tobacco abuse, NICM , CHF (EF 45-50% from echo 04/2018  improved from  30-35% in 09/2016), atrial flutter, chronic respiratory failure with hypoxia (currently on 3 L of oxygen), multiple hospitalizations for GI bleed with secondary anemia due to AV malformations, peptic ulcer disease, previous history of CVA.  Complains of epistaxis for the last 2 weeks which occur mostly in the morning and in alternate nostrils. He just feels his nose running and sees blood. This lasts a short time (1-29mins) .His ear also feels clogged "like he is under water". Epistaxis occurs spontaneously without triggers and occurs mostly in the mornings and he would usually apply pressure to his nose with resolution of symptoms.    Past Medical History:  Diagnosis Date   AVM (arteriovenous malformation)    CAD (coronary artery disease)    a. LHC 5/12:  LAD 20, pLCx 20, pRCA 40, dRCA 40, EF 35%, diff HK  //  b. Myoview 4/16: Overall Impression:  High risk stress nuclear study There is no evidence of ischemia.  There is severe LV dysfunction. LV Ejection Fraction: 30%.  LV Wall Motion:  There is global LV hypokinesis.     CAP  (community acquired pneumonia) 09/2013   Chronic combined systolic and diastolic CHF (congestive heart failure) (HCC)    a. Echo 4/16:Mild LVH, EF 40-45%, diffuse HK //  b. Echo 8/17: EF 35-40%, diffuse HK, diastolic dysfunction, aortic sclerosis, trivial MR, moderate LAE, normal RVSF, moderate RAE, mild TR, PASP 42 mmHg // c. Echo 4/18: Mild concentric LVH, EF 30-35, normal wall motion, grade 1 diastolic dysfunction, PASP 49   Chronic respiratory failure (HCC)    Cluster headache    "hx; haven't had one in awhile" (01/09/2016)   COPD (chronic obstructive pulmonary disease) (HCC)    [name] 01/09/2016   History of CVA (cerebrovascular accident)    Hypertension    IDA (iron deficiency anemia)    Moderate tobacco use disorder    NICM (nonischemic cardiomyopathy) (HCC)    Nicotine addiction    Tobacco abuse    Allergies  Allergen Reactions   Lisinopril Cough    Current Outpatient Medications on File Prior to Visit  Medication Sig Dispense Refill   albuterol (PROVENTIL) (2.5 MG/3ML) 0.083% nebulizer solution Take 3 mLs (2.5 mg total) by nebulization every 4 (four) hours as needed for wheezing or shortness of breath. 75 mL 0   albuterol (VENTOLIN HFA) 108 (90 Base) MCG/ACT inhaler INHALE 2 PUFFS INTO THE LUNGS EVERY 6 (SIX) HOURS AS NEEDED FOR WHEEZING OR SHORTNESS OF BREATH. 18 g 5   atorvastatin (LIPITOR) 80 MG tablet TAKE 1 TABLET EVERY DAY 90 tablet 0   Budeson-Glycopyrrol-Formoterol 160-9-4.8 MCG/ACT AERO INHALE 2 PUFFS INTO THE  LUNGS 2 (TWO) TIMES DAILY. 10.7 g 11   ferrous sulfate 325 (65 FE) MG tablet Take 1 tablet (325 mg total) by mouth 2 (two) times daily with a meal. 60 tablet 1   furosemide (LASIX) 40 MG tablet TAKE 1 TABLET EVERY DAY 90 tablet 0   gabapentin (NEURONTIN) 300 MG capsule TAKE 1 CAPSULE (300 MG TOTAL) BY MOUTH AT BEDTIME. 30 capsule 6   Misc. Devices MISC Inhale 3 L into the lungs as needed. Portable oxygen concentrator. Diagnosis COPD.     Multiple Vitamin  (MULTIVITAMIN WITH MINERALS) TABS tablet Take 1 tablet by mouth daily.     nitroGLYCERIN (NITROSTAT) 0.4 MG SL tablet Place 1 tablet (0.4 mg total) under the tongue every 5 (five) minutes as needed for chest pain.     pantoprazole (PROTONIX) 40 MG tablet Take 1 tablet (40 mg total) by mouth daily before breakfast. 30 tablet 11   XARELTO 20 MG TABS tablet TAKE 1 TABLET EVERY DAY WITH SUPPER 90 tablet 0   [DISCONTINUED] TUDORZA PRESSAIR 400 MCG/ACT AEPB INHALE 1 PUFF INTO THE LUNGS IN THE MORNING AND AT BEDTIME. 1 each 11   No current facility-administered medications on file prior to visit.    ROS: See HPI  Observations/Objective: Awake, alert, oriented x3 Not in acute distress Normal mood  Assessment and Plan: 1. Epistaxis Likely to be of sinus origin Will treat with nasal steroid and antihistamine and management of epistaxis discussed We have discussed risk versus benefit of anticoagulation with Xarelto in the light of his epistaxis.  At this time benefit outweighs the risk. I will see him back in 1 week and if symptoms persist I will have to refer him to ENT and he will need to discuss with his cardiologist again. Advised that if epistaxis worsens he needs to go to the ED. - fluticasone (FLONASE) 50 MCG/ACT nasal spray; Place 1 spray into both nostrils daily.  Dispense: 16 g; Refill: 1 - cetirizine (ZYRTEC) 10 MG tablet; Take 1 tablet (10 mg total) by mouth daily.  Dispense: 30 tablet; Refill: 1   Follow Up Instructions: 1 week for follow-up of epistaxis   I discussed the assessment and treatment plan with the patient. The patient was provided an opportunity to ask questions and all were answered. The patient agreed with the plan and demonstrated an understanding of the instructions.   The patient was advised to call back or seek an in-person evaluation if the symptoms worsen or if the condition fails to improve as anticipated.     I provided 11 minutes total of  non-face-to-face time during this encounter.   [name], MD, FAAFP. [facility] [place], [place] [phone]   01/11/2021, 8:17 AM

## 2021-01-16 NOTE — Telephone Encounter (Signed)
noted 

## 2021-01-16 NOTE — Telephone Encounter (Signed)
Thank you Jared [name]. Im glad he understood the difference between ALF and receiving PCS.

## 2021-01-18 ENCOUNTER — Other Ambulatory Visit: Payer: Self-pay

## 2021-01-18 MED FILL — Budesonide-Glycopyrrolate-Formoterol Aers 160-9-4.8 MCG/ACT: RESPIRATORY_TRACT | 30 days supply | Qty: 10.7 | Fill #4 | Status: AC

## 2021-01-18 NOTE — Telephone Encounter (Signed)
Do I need to assist with anything with this pt?

## 2021-01-19 ENCOUNTER — Other Ambulatory Visit: Payer: Self-pay

## 2021-01-23 DIAGNOSIS — I5043 Acute on chronic combined systolic (congestive) and diastolic (congestive) heart failure: Secondary | ICD-10-CM | POA: Diagnosis not present

## 2021-01-25 ENCOUNTER — Other Ambulatory Visit: Payer: Self-pay

## 2021-01-26 ENCOUNTER — Other Ambulatory Visit (INDEPENDENT_AMBULATORY_CARE_PROVIDER_SITE_OTHER): Payer: Medicare HMO

## 2021-01-26 ENCOUNTER — Other Ambulatory Visit: Payer: Self-pay

## 2021-01-26 DIAGNOSIS — D500 Iron deficiency anemia secondary to blood loss (chronic): Secondary | ICD-10-CM

## 2021-01-26 LAB — FERRITIN: Ferritin: 10 ng/mL — ABNORMAL LOW (ref 22.0–322.0)

## 2021-01-26 LAB — CBC WITH DIFFERENTIAL/PLATELET
Basophils Absolute: 0.1 10*3/uL (ref 0.0–0.1)
Basophils Relative: 1.1 % (ref 0.0–3.0)
Eosinophils Absolute: 0.1 10*3/uL (ref 0.0–0.7)
Eosinophils Relative: 1.2 % (ref 0.0–5.0)
HCT: 42.5 % (ref 39.0–52.0)
Hemoglobin: 11.9 g/dL — ABNORMAL LOW (ref 13.0–17.0)
Lymphocytes Relative: 28.4 % (ref 12.0–46.0)
Lymphs Abs: 1.8 10*3/uL (ref 0.7–4.0)
MCHC: 27.9 g/dL — ABNORMAL LOW (ref 30.0–36.0)
MCV: 82.7 fl (ref 78.0–100.0)
Monocytes Absolute: 0.7 10*3/uL (ref 0.1–1.0)
Monocytes Relative: 10.8 % (ref 3.0–12.0)
Neutro Abs: 3.8 10*3/uL (ref 1.4–7.7)
Neutrophils Relative %: 58.5 % (ref 43.0–77.0)
Platelets: 281 10*3/uL (ref 150.0–400.0)
RBC: 5.15 Mil/uL (ref 4.22–5.81)
RDW: 18.7 % — ABNORMAL HIGH (ref 11.5–15.5)
WBC: 6.5 10*3/uL (ref 4.0–10.5)

## 2021-01-27 LAB — IRON AND TIBC
Iron Saturation: >95 % (ref 15–55)
Iron: 355 ug/dL (ref 38–169)
Total Iron Binding Capacity: <372 ug/dL (ref 250–450)
UIBC: <17 ug/dL — ABNORMAL LOW (ref 111–343)

## 2021-01-30 ENCOUNTER — Ambulatory Visit (INDEPENDENT_AMBULATORY_CARE_PROVIDER_SITE_OTHER): Payer: Medicare HMO | Admitting: Internal Medicine

## 2021-01-30 ENCOUNTER — Other Ambulatory Visit: Payer: Self-pay

## 2021-01-30 ENCOUNTER — Encounter: Payer: Self-pay | Admitting: Internal Medicine

## 2021-01-30 ENCOUNTER — Inpatient Hospital Stay (HOSPITAL_COMMUNITY)
Admission: EM | Admit: 2021-01-30 | Discharge: 2021-02-02 | DRG: 291 | Disposition: A | Discharge status: Home Health | Payer: Medicare HMO | Attending: Internal Medicine | Admitting: Internal Medicine

## 2021-01-30 ENCOUNTER — Encounter (HOSPITAL_COMMUNITY): Payer: Self-pay | Admitting: *Deleted

## 2021-01-30 ENCOUNTER — Emergency Department (HOSPITAL_COMMUNITY): Payer: Medicare HMO

## 2021-01-30 VITALS — BP 130/68 | HR 90 | Ht 75.0 in | Wt 228.2 lb

## 2021-01-30 DIAGNOSIS — I5021 Acute systolic (congestive) heart failure: Secondary | ICD-10-CM | POA: Diagnosis not present

## 2021-01-30 DIAGNOSIS — I517 Cardiomegaly: Secondary | ICD-10-CM | POA: Diagnosis not present

## 2021-01-30 DIAGNOSIS — Z7901 Long term (current) use of anticoagulants: Secondary | ICD-10-CM | POA: Diagnosis not present

## 2021-01-30 DIAGNOSIS — I4892 Unspecified atrial flutter: Secondary | ICD-10-CM | POA: Diagnosis present

## 2021-01-30 DIAGNOSIS — R7303 Prediabetes: Secondary | ICD-10-CM | POA: Diagnosis present

## 2021-01-30 DIAGNOSIS — R0602 Shortness of breath: Secondary | ICD-10-CM | POA: Diagnosis not present

## 2021-01-30 DIAGNOSIS — I4821 Permanent atrial fibrillation: Secondary | ICD-10-CM | POA: Diagnosis present

## 2021-01-30 DIAGNOSIS — Z20822 Contact with and (suspected) exposure to covid-19: Secondary | ICD-10-CM | POA: Diagnosis not present

## 2021-01-30 DIAGNOSIS — R0902 Hypoxemia: Secondary | ICD-10-CM | POA: Diagnosis not present

## 2021-01-30 DIAGNOSIS — Z23 Encounter for immunization: Secondary | ICD-10-CM

## 2021-01-30 DIAGNOSIS — Z833 Family history of diabetes mellitus: Secondary | ICD-10-CM

## 2021-01-30 DIAGNOSIS — I110 Hypertensive heart disease with heart failure: Secondary | ICD-10-CM | POA: Diagnosis present

## 2021-01-30 DIAGNOSIS — Z800 Family history of malignant neoplasm of digestive organs: Secondary | ICD-10-CM

## 2021-01-30 DIAGNOSIS — J9622 Acute and chronic respiratory failure with hypercapnia: Secondary | ICD-10-CM | POA: Diagnosis present

## 2021-01-30 DIAGNOSIS — J441 Chronic obstructive pulmonary disease with (acute) exacerbation: Secondary | ICD-10-CM | POA: Diagnosis present

## 2021-01-30 DIAGNOSIS — I2510 Atherosclerotic heart disease of native coronary artery without angina pectoris: Secondary | ICD-10-CM | POA: Diagnosis present

## 2021-01-30 DIAGNOSIS — I5043 Acute on chronic combined systolic (congestive) and diastolic (congestive) heart failure: Secondary | ICD-10-CM | POA: Diagnosis not present

## 2021-01-30 DIAGNOSIS — J449 Chronic obstructive pulmonary disease, unspecified: Secondary | ICD-10-CM | POA: Diagnosis not present

## 2021-01-30 DIAGNOSIS — R0689 Other abnormalities of breathing: Secondary | ICD-10-CM | POA: Diagnosis not present

## 2021-01-30 DIAGNOSIS — Z79899 Other long term (current) drug therapy: Secondary | ICD-10-CM

## 2021-01-30 DIAGNOSIS — F17210 Nicotine dependence, cigarettes, uncomplicated: Secondary | ICD-10-CM | POA: Diagnosis present

## 2021-01-30 DIAGNOSIS — J9621 Acute and chronic respiratory failure with hypoxia: Secondary | ICD-10-CM | POA: Diagnosis present

## 2021-01-30 DIAGNOSIS — I428 Other cardiomyopathies: Secondary | ICD-10-CM | POA: Diagnosis present

## 2021-01-30 DIAGNOSIS — D500 Iron deficiency anemia secondary to blood loss (chronic): Secondary | ICD-10-CM | POA: Diagnosis present

## 2021-01-30 DIAGNOSIS — I248 Other forms of acute ischemic heart disease: Secondary | ICD-10-CM | POA: Diagnosis present

## 2021-01-30 DIAGNOSIS — E119 Type 2 diabetes mellitus without complications: Secondary | ICD-10-CM | POA: Diagnosis present

## 2021-01-30 DIAGNOSIS — J9610 Chronic respiratory failure, unspecified whether with hypoxia or hypercapnia: Secondary | ICD-10-CM | POA: Diagnosis not present

## 2021-01-30 DIAGNOSIS — D509 Iron deficiency anemia, unspecified: Secondary | ICD-10-CM | POA: Diagnosis not present

## 2021-01-30 DIAGNOSIS — R0600 Dyspnea, unspecified: Secondary | ICD-10-CM

## 2021-01-30 DIAGNOSIS — Z9981 Dependence on supplemental oxygen: Secondary | ICD-10-CM | POA: Diagnosis not present

## 2021-01-30 DIAGNOSIS — I10 Essential (primary) hypertension: Secondary | ICD-10-CM | POA: Diagnosis present

## 2021-01-30 DIAGNOSIS — Z8673 Personal history of transient ischemic attack (TIA), and cerebral infarction without residual deficits: Secondary | ICD-10-CM | POA: Diagnosis not present

## 2021-01-30 DIAGNOSIS — E785 Hyperlipidemia, unspecified: Secondary | ICD-10-CM | POA: Diagnosis present

## 2021-01-30 DIAGNOSIS — R778 Other specified abnormalities of plasma proteins: Secondary | ICD-10-CM | POA: Diagnosis present

## 2021-01-30 DIAGNOSIS — Z8249 Family history of ischemic heart disease and other diseases of the circulatory system: Secondary | ICD-10-CM | POA: Diagnosis not present

## 2021-01-30 DIAGNOSIS — R7989 Other specified abnormal findings of blood chemistry: Secondary | ICD-10-CM | POA: Diagnosis present

## 2021-01-30 LAB — I-STAT VENOUS BLOOD GAS, ED
Acid-Base Excess: 12 mmol/L — ABNORMAL HIGH (ref 0.0–2.0)
Acid-Base Excess: 12 mmol/L — ABNORMAL HIGH (ref 0.0–2.0)
Bicarbonate: 43.8 mmol/L — ABNORMAL HIGH (ref 20.0–28.0)
Bicarbonate: 45.6 mmol/L — ABNORMAL HIGH (ref 20.0–28.0)
Calcium, Ion: 1.05 mmol/L — ABNORMAL LOW (ref 1.15–1.40)
Calcium, Ion: 1.1 mmol/L — ABNORMAL LOW (ref 1.15–1.40)
HCT: 48 % (ref 39.0–52.0)
HCT: 49 % (ref 39.0–52.0)
Hemoglobin: 16.3 g/dL (ref 13.0–17.0)
Hemoglobin: 16.7 g/dL (ref 13.0–17.0)
O2 Saturation: 99 %
O2 Saturation: 99 %
Potassium: 3.8 mmol/L (ref 3.5–5.1)
Potassium: 4.2 mmol/L (ref 3.5–5.1)
Sodium: 142 mmol/L (ref 135–145)
Sodium: 143 mmol/L (ref 135–145)
TCO2: 46 mmol/L — ABNORMAL HIGH (ref 22–32)
TCO2: 49 mmol/L — ABNORMAL HIGH (ref 22–32)
pCO2, Ven: 90.6 mmHg (ref 44.0–60.0)
pCO2, Ven: 109.7 mmHg (ref 44.0–60.0)
pH, Ven: 7.292 (ref 7.250–7.430)
pH, Ven: 7.226 — ABNORMAL LOW (ref 7.250–7.430)
pO2, Ven: 148 mmHg — ABNORMAL HIGH (ref 32.0–45.0)
pO2, Ven: 205 mmHg — ABNORMAL HIGH (ref 32.0–45.0)

## 2021-01-30 LAB — URINALYSIS, ROUTINE W REFLEX MICROSCOPIC
Bilirubin Urine: NEGATIVE
Glucose, UA: NEGATIVE mg/dL
Hgb urine dipstick: NEGATIVE
Ketones, ur: NEGATIVE mg/dL
Leukocytes,Ua: NEGATIVE
Nitrite: NEGATIVE
Protein, ur: NEGATIVE mg/dL
Specific Gravity, Urine: 1.008 (ref 1.005–1.030)
pH: 6 (ref 5.0–8.0)

## 2021-01-30 LAB — COMPREHENSIVE METABOLIC PANEL
ALT: 32 U/L (ref 0–44)
AST: 34 U/L (ref 15–41)
Albumin: 3.8 g/dL (ref 3.5–5.0)
Alkaline Phosphatase: 69 U/L (ref 38–126)
Anion gap: 7 (ref 5–15)
BUN: 10 mg/dL (ref 8–23)
CO2: 37 mmol/L — ABNORMAL HIGH (ref 22–32)
Calcium: 8.6 mg/dL — ABNORMAL LOW (ref 8.9–10.3)
Chloride: 98 mmol/L (ref 98–111)
Creatinine, Ser: 0.83 mg/dL (ref 0.61–1.24)
GFR, Estimated: >60 mL/min (ref >60)
Glucose, Bld: 104 mg/dL — ABNORMAL HIGH (ref 70–99)
Potassium: 3.6 mmol/L (ref 3.5–5.1)
Sodium: 142 mmol/L (ref 135–145)
Total Bilirubin: 0.5 mg/dL (ref 0.3–1.2)
Total Protein: 6.6 g/dL (ref 6.5–8.1)

## 2021-01-30 LAB — CBC WITH DIFFERENTIAL/PLATELET
Abs Immature Granulocytes: 0.02 10*3/uL (ref 0.00–0.07)
Basophils Absolute: 0 10*3/uL (ref 0.0–0.1)
Basophils Relative: 0 %
Eosinophils Absolute: 0.1 10*3/uL (ref 0.0–0.5)
Eosinophils Relative: 1 %
HCT: 47.1 % (ref 39.0–52.0)
Hemoglobin: 11.8 g/dL — ABNORMAL LOW (ref 13.0–17.0)
Immature Granulocytes: 0 %
Lymphocytes Relative: 29 %
Lymphs Abs: 1.9 10*3/uL (ref 0.7–4.0)
MCH: 22.8 pg — ABNORMAL LOW (ref 26.0–34.0)
MCHC: 25.1 g/dL — ABNORMAL LOW (ref 30.0–36.0)
MCV: 91.1 fL (ref 80.0–100.0)
Monocytes Absolute: 0.8 10*3/uL (ref 0.1–1.0)
Monocytes Relative: 12 %
Neutro Abs: 3.9 10*3/uL (ref 1.7–7.7)
Neutrophils Relative %: 58 %
Platelets: 346 10*3/uL (ref 150–400)
RBC: 5.17 MIL/uL (ref 4.22–5.81)
RDW: 16.9 % — ABNORMAL HIGH (ref 11.5–15.5)
WBC: 6.7 10*3/uL (ref 4.0–10.5)
nRBC: 0.3 % — ABNORMAL HIGH (ref 0.0–0.2)

## 2021-01-30 LAB — BLOOD GAS, VENOUS
Acid-Base Excess: 14.7 mmol/L — ABNORMAL HIGH (ref 0.0–2.0)
Bicarbonate: 43.4 mmol/L — ABNORMAL HIGH (ref 20.0–28.0)
Drawn by: 6385
FIO2: 48
O2 Saturation: 61.7 %
Patient temperature: 37
pCO2, Ven: >120 mmHg (ref 44.0–60.0)
pH, Ven: 7.176 — CL (ref 7.250–7.430)
pO2, Ven: 40.5 mmHg (ref 32.0–45.0)

## 2021-01-30 LAB — BRAIN NATRIURETIC PEPTIDE: B Natriuretic Peptide: 1093.1 pg/mL — ABNORMAL HIGH (ref 0.0–100.0)

## 2021-01-30 LAB — RESP PANEL BY RT-PCR (FLU A&B, COVID) ARPGX2
Influenza A by PCR: NEGATIVE
Influenza B by PCR: NEGATIVE
SARS Coronavirus 2 by RT PCR: NEGATIVE

## 2021-01-30 LAB — TROPONIN I (HIGH SENSITIVITY)
Troponin I (High Sensitivity): 40 ng/L — ABNORMAL HIGH (ref <18)
Troponin I (High Sensitivity): 42 ng/L — ABNORMAL HIGH (ref <18)

## 2021-01-30 LAB — LACTIC ACID, PLASMA: Lactic Acid, Venous: 1.2 mmol/L (ref 0.5–1.9)

## 2021-01-30 LAB — CBC
HCT: 49.5 % (ref 39.0–52.0)
Hemoglobin: 12 g/dL — ABNORMAL LOW (ref 13.0–17.0)
MCH: 22.5 pg — ABNORMAL LOW (ref 26.0–34.0)
MCHC: 24.2 g/dL — ABNORMAL LOW (ref 30.0–36.0)
MCV: 92.9 fL (ref 80.0–100.0)
Platelets: 333 K/uL (ref 150–400)
RBC: 5.33 MIL/uL (ref 4.22–5.81)
RDW: 17.1 % — ABNORMAL HIGH (ref 11.5–15.5)
WBC: 8.9 K/uL (ref 4.0–10.5)
nRBC: 0.2 % (ref 0.0–0.2)

## 2021-01-30 LAB — MAGNESIUM: Magnesium: 2.1 mg/dL (ref 1.7–2.4)

## 2021-01-30 MED ORDER — ACETAMINOPHEN 325 MG PO TABS
650.0000 mg | ORAL_TABLET | Freq: Four times a day (QID) | ORAL | Status: DC | PRN
  Administered 2021-01-31 – 2021-02-01 (×2): 650 mg via ORAL
  Filled 2021-01-30 (×3): qty 2

## 2021-01-30 MED ORDER — FERROUS SULFATE 325 (65 FE) MG PO TABS
325.0000 mg | ORAL_TABLET | Freq: Two times a day (BID) | ORAL | Status: DC
  Administered 2021-01-31 – 2021-02-02 (×5): 325 mg via ORAL
  Filled 2021-01-30 (×5): qty 1

## 2021-01-30 MED ORDER — FLUTICASONE PROPIONATE 50 MCG/ACT NA SUSP
1.0000 | Freq: Every day | NASAL | Status: DC
  Administered 2021-01-31 – 2021-02-02 (×3): 1 via NASAL
  Filled 2021-01-30: qty 16

## 2021-01-30 MED ORDER — BUDESON-GLYCOPYRROL-FORMOTEROL 160-9-4.8 MCG/ACT IN AERO: 2.0000 | INHALATION_SPRAY | Freq: Two times a day (BID) | RESPIRATORY_TRACT | Status: DC

## 2021-01-30 MED ORDER — ALBUTEROL SULFATE HFA 108 (90 BASE) MCG/ACT IN AERS
2.0000 | INHALATION_SPRAY | Freq: Once | RESPIRATORY_TRACT | Status: AC
  Administered 2021-01-30: 2 via RESPIRATORY_TRACT
  Filled 2021-01-30: qty 6.7

## 2021-01-30 MED ORDER — ALBUTEROL SULFATE (2.5 MG/3ML) 0.083% IN NEBU
2.5000 mg | INHALATION_SOLUTION | RESPIRATORY_TRACT | Status: DC | PRN
  Administered 2021-01-31: 2.5 mg via RESPIRATORY_TRACT

## 2021-01-30 MED ORDER — SODIUM CHLORIDE 0.9 % IV SOLN: 250.0000 mL | INTRAVENOUS | Status: DC | PRN

## 2021-01-30 MED ORDER — FLUTICASONE PROPIONATE 50 MCG/ACT NA SUSP: 1.0000 | Freq: Every day | NASAL | Status: DC

## 2021-01-30 MED ORDER — ADULT MULTIVITAMIN W/MINERALS CH
1.0000 | ORAL_TABLET | Freq: Every day | ORAL | Status: DC
  Administered 2021-01-31 – 2021-02-02 (×3): 1 via ORAL
  Filled 2021-01-30 (×3): qty 1

## 2021-01-30 MED ORDER — GABAPENTIN 300 MG PO CAPS
300.0000 mg | ORAL_CAPSULE | Freq: Every day | ORAL | Status: DC
  Administered 2021-01-30 – 2021-02-01 (×3): 300 mg via ORAL
  Filled 2021-01-30 (×3): qty 1

## 2021-01-30 MED ORDER — MAGNESIUM SULFATE 2 GM/50ML IV SOLN
2.0000 g | Freq: Once | INTRAVENOUS | Status: AC
  Administered 2021-01-30: 2 g via INTRAVENOUS
  Filled 2021-01-30: qty 50

## 2021-01-30 MED ORDER — SODIUM CHLORIDE 0.9% FLUSH
3.0000 mL | Freq: Two times a day (BID) | INTRAVENOUS | Status: DC
  Administered 2021-01-30 – 2021-02-02 (×6): 3 mL via INTRAVENOUS

## 2021-01-30 MED ORDER — NITROGLYCERIN 0.4 MG SL SUBL: 0.4000 mg | SUBLINGUAL_TABLET | SUBLINGUAL | Status: DC | PRN

## 2021-01-30 MED ORDER — IPRATROPIUM-ALBUTEROL 0.5-2.5 (3) MG/3ML IN SOLN
3.0000 mL | Freq: Once | RESPIRATORY_TRACT | Status: AC
  Administered 2021-01-30: 3 mL via RESPIRATORY_TRACT
  Filled 2021-01-30: qty 3

## 2021-01-30 MED ORDER — SODIUM CHLORIDE 0.9% FLUSH: 3.0000 mL | INTRAVENOUS | Status: DC | PRN

## 2021-01-30 MED ORDER — FLUTICASONE FUROATE-VILANTEROL 100-25 MCG/INH IN AEPB
1.0000 | INHALATION_SPRAY | Freq: Every day | RESPIRATORY_TRACT | Status: DC
  Administered 2021-02-01 – 2021-02-02 (×2): 1 via RESPIRATORY_TRACT
  Filled 2021-01-30: qty 28

## 2021-01-30 MED ORDER — PREDNISONE 20 MG PO TABS: 40.0000 mg | ORAL_TABLET | Freq: Every day | ORAL | Status: DC

## 2021-01-30 MED ORDER — ACETAMINOPHEN 650 MG RE SUPP: 650.0000 mg | Freq: Four times a day (QID) | RECTAL | Status: DC | PRN

## 2021-01-30 MED ORDER — METHYLPREDNISOLONE SODIUM SUCC 125 MG IJ SOLR
125.0000 mg | Freq: Once | INTRAMUSCULAR | Status: AC
  Administered 2021-01-30: 125 mg via INTRAVENOUS
  Filled 2021-01-30: qty 2

## 2021-01-30 MED ORDER — RIVAROXABAN 20 MG PO TABS
20.0000 mg | ORAL_TABLET | Freq: Every day | ORAL | Status: DC
  Administered 2021-01-30 – 2021-02-01 (×3): 20 mg via ORAL
  Filled 2021-01-30 (×3): qty 1

## 2021-01-30 MED ORDER — UMECLIDINIUM BROMIDE 62.5 MCG/INH IN AEPB
1.0000 | INHALATION_SPRAY | Freq: Every day | RESPIRATORY_TRACT | Status: DC
  Administered 2021-02-01 – 2021-02-02 (×2): 1 via RESPIRATORY_TRACT
  Filled 2021-01-30: qty 7

## 2021-01-30 MED ORDER — FUROSEMIDE 10 MG/ML IJ SOLN
40.0000 mg | Freq: Every day | INTRAMUSCULAR | Status: DC
  Administered 2021-01-31 – 2021-02-01 (×2): 40 mg via INTRAVENOUS
  Filled 2021-01-30 (×2): qty 4

## 2021-01-30 MED ORDER — FUROSEMIDE 10 MG/ML IJ SOLN
40.0000 mg | Freq: Once | INTRAMUSCULAR | Status: AC
  Administered 2021-01-30: 40 mg via INTRAVENOUS
  Filled 2021-01-30: qty 4

## 2021-01-30 MED ORDER — NICOTINE 21 MG/24HR TD PT24
21.0000 mg | MEDICATED_PATCH | Freq: Every day | TRANSDERMAL | Status: DC
  Administered 2021-01-30 – 2021-02-02 (×4): 21 mg via TRANSDERMAL
  Filled 2021-01-30 (×4): qty 1

## 2021-01-30 MED ORDER — ATORVASTATIN CALCIUM 80 MG PO TABS
80.0000 mg | ORAL_TABLET | Freq: Every day | ORAL | Status: DC
  Administered 2021-01-31 – 2021-02-02 (×3): 80 mg via ORAL
  Filled 2021-01-30 (×3): qty 1

## 2021-01-30 MED ORDER — METHYLPREDNISOLONE SODIUM SUCC 125 MG IJ SOLR
120.0000 mg | INTRAMUSCULAR | Status: DC
  Administered 2021-01-31 – 2021-02-01 (×2): 120 mg via INTRAVENOUS
  Filled 2021-01-30 (×2): qty 2

## 2021-01-30 MED ORDER — IPRATROPIUM-ALBUTEROL 0.5-2.5 (3) MG/3ML IN SOLN
3.0000 mL | RESPIRATORY_TRACT | Status: AC
  Administered 2021-01-30 – 2021-01-31 (×3): 3 mL via RESPIRATORY_TRACT
  Filled 2021-01-30 (×2): qty 3

## 2021-01-30 MED ORDER — CEFEPIME HCL 2 G IJ SOLR
2.0000 g | Freq: Three times a day (TID) | INTRAMUSCULAR | Status: DC
  Administered 2021-01-30 – 2021-02-02 (×8): 2 g via INTRAVENOUS
  Filled 2021-01-30 (×9): qty 2

## 2021-01-30 NOTE — ED Notes (Signed)
EDP at BS 

## 2021-01-30 NOTE — ED Notes (Addendum)
Decreased SPO2 84% on 4-6L [place] while sleeping/ mouth breathing, switched to venturi mask 40% 8L. Lasix given. Has been using urinal.

## 2021-01-30 NOTE — H&P (Signed)
History and Physical    Jared [name] [id number] DOB: 09/17/1955 DOA: 01/30/2021  PCP: Hoy RegisterNewlin, Enobong, MD Consultants:  cardiology: Tenny Crawoss, pulmonology: Dr. Sherene SiresWert, GI: dr. Rhea BeltonPyrtle  Patient coming from:  Home - lives alone  Chief Complaint: shortness of breath and hypoxia.   HPI: Jared [name] is a 65 y.o. male with medical history significant of CAD, combined systolic and diastolic CHF, hx of CVA, hx of GI bleed in 4/22, HTN, HLD, paroxysmal atrial flutter on xarelto, COPD on oxygen on 4L, tobacco abuse who presented to his cardiologist office and had saturation to 77-81 on 4L [place] and wheezing so he was sent to ER. He tells me he has been feeling bad x 2 weeks. He has been short of breath, coughing and sneezing, and coughing up more pheglm than normal  It is thicker than normal and clear. He has had increased dyspnea on exertion. He has been wheezing and tight.  He has had a headache. Denies any chest pain,palpitations, stomach pain, N/V/D. He states he has increased lower leg swelling and he thinks he has gained weight, but has no idea how much. He has mild orthopnea. Eating and drinking okay.   He smokes 1 PPD.    ED Course: vitals: afebrile, bp: 130/68, HR: 90, oxygen: 77--99% on 4L [place] Pertinent labs: bnp: 1093, hgb: 11.8, co2: 37, troponin: 40---> pending, CXR: stable to slightly increased cardiomegaly without evidence of pulmonary edema. Given albuterol, lasix, duoneb, mag IV, solumedtrol. Called and asked to admit.   Review of Systems: As per HPI; otherwise review of systems reviewed and negative.   Ambulatory Status:  Ambulates without assistance   Past Medical History:  Diagnosis Date   AVM (arteriovenous malformation)    CAD (coronary artery disease)    a. LHC 5/12:  LAD 20, pLCx 20, pRCA 40, dRCA 40, EF 35%, diff HK  //  b. Myoview 4/16: Overall Impression:  High risk stress nuclear study There is no evidence of ischemia.  There is severe LV dysfunction. LV Ejection Fraction: 30%.   LV Wall Motion:  There is global LV hypokinesis.     CAP (community acquired pneumonia) 09/2013   Chronic combined systolic and diastolic CHF (congestive heart failure) (HCC)    a. Echo 4/16:Mild LVH, EF 40-45%, diffuse HK //  b. Echo 8/17: EF 35-40%, diffuse HK, diastolic dysfunction, aortic sclerosis, trivial MR, moderate LAE, normal RVSF, moderate RAE, mild TR, PASP 42 mmHg // c. Echo 4/18: Mild concentric LVH, EF 30-35, normal wall motion, grade 1 diastolic dysfunction, PASP 49   Chronic respiratory failure (HCC)    Cluster headache    "hx; haven't had one in awhile" (01/09/2016)   COPD (chronic obstructive pulmonary disease) (HCC)    [name]/notes 01/09/2016   History of CVA (cerebrovascular accident)    Hypertension    IDA (iron deficiency anemia)    Moderate tobacco use disorder    NICM (nonischemic cardiomyopathy) (HCC)    Nicotine addiction    Tobacco abuse     Past Surgical History:  Procedure Laterality Date   BIOPSY  11/12/2020   Procedure: BIOPSY;  Surgeon: Lemar LoftyMansouraty, Gabriel Jr., MD;  Location: [facility] ENDOSCOPY;  Service: Gastroenterology;;   CARDIAC CATHETERIZATION  10/2010   LM normal, LAD with 20% irregularities, LCX with 20%, RCA with 40% prox and 40% distal - EF of 35%   CATARACT EXTRACTION, BILATERAL     COLONOSCOPY W/ BIOPSIES AND POLYPECTOMY     COLONOSCOPY WITH PROPOFOL N/A 09/06/2018   Procedure: COLONOSCOPY  WITH PROPOFOL;  Surgeon: Tressia Danas, MD;  Location: [facility] ENDOSCOPY;  Service: Gastroenterology;  Laterality: N/A;   ENTEROSCOPY N/A 09/28/2018   Procedure: ENTEROSCOPY;  Surgeon: Jeani Hawking, MD;  Location: [facility] ENDOSCOPY;  Service: Endoscopy;  Laterality: N/A;   ENTEROSCOPY N/A 10/28/2018   Procedure: ENTEROSCOPY;  Surgeon: Tressia Danas, MD;  Location: [facility] ENDOSCOPY;  Service: Gastroenterology;  Laterality: N/A;   ENTEROSCOPY N/A 10/09/2020   Procedure: ENTEROSCOPY;  Surgeon: Hilarie Fredrickson, MD;  Location: [facility] ENDOSCOPY;  Service: Endoscopy;  Laterality: N/A;    ESOPHAGOGASTRODUODENOSCOPY N/A 11/12/2020   Procedure: ESOPHAGOGASTRODUODENOSCOPY (EGD);  Surgeon: Lemar Lofty., MD;  Location: [facility] ENDOSCOPY;  Service: Gastroenterology;  Laterality: N/A;   ESOPHAGOGASTRODUODENOSCOPY (EGD) WITH PROPOFOL N/A 09/05/2018   Procedure: ESOPHAGOGASTRODUODENOSCOPY (EGD) WITH PROPOFOL;  Surgeon: Benancio Deeds, MD;  Location: [facility] ENDOSCOPY;  Service: Gastroenterology;  Laterality: N/A;   ESOPHAGOGASTRODUODENOSCOPY (EGD) WITH PROPOFOL N/A 11/19/2020   Procedure: ESOPHAGOGASTRODUODENOSCOPY (EGD) WITH PROPOFOL;  Surgeon: Beverley Fiedler, MD;  Location: [facility] ENDOSCOPY;  Service: Gastroenterology;  Laterality: N/A;   EXCISION MASS HEAD     GIVENS CAPSULE STUDY N/A 09/06/2018   Procedure: GIVENS CAPSULE STUDY;  Surgeon: Tressia Danas, MD;  Location: [facility] ENDOSCOPY;  Service: Gastroenterology;  Laterality: N/A;   GIVENS CAPSULE STUDY N/A 09/26/2018   Procedure: GIVENS CAPSULE STUDY;  Surgeon: Beverley Fiedler, MD;  Location: [facility] ENDOSCOPY;  Service: Gastroenterology;  Laterality: N/A;   GIVENS CAPSULE STUDY N/A 06/14/2019   Procedure: GIVENS CAPSULE STUDY;  Surgeon: Napoleon Form, MD;  Location: [facility] ENDOSCOPY;  Service: Endoscopy;  Laterality: N/A;   HEMOSTASIS CLIP PLACEMENT  11/12/2020   Procedure: HEMOSTASIS CLIP PLACEMENT;  Surgeon: Lemar Lofty., MD;  Location: [facility] ENDOSCOPY;  Service: Gastroenterology;;   HEMOSTASIS CONTROL  11/12/2020   Procedure: HEMOSTASIS CONTROL;  Surgeon: Lemar Lofty., MD;  Location: [facility] ENDOSCOPY;  Service: Gastroenterology;;   HOT HEMOSTASIS N/A 10/28/2018   Procedure: HOT HEMOSTASIS (ARGON PLASMA COAGULATION/BICAP);  Surgeon: Tressia Danas, MD;  Location: [facility] ENDOSCOPY;  Service: Gastroenterology;  Laterality: N/A;   HOT HEMOSTASIS N/A 11/12/2020   Procedure: HOT HEMOSTASIS (ARGON PLASMA COAGULATION/BICAP);  Surgeon: Lemar Lofty., MD;  Location: [facility] ENDOSCOPY;  Service: Gastroenterology;  Laterality: N/A;    INCISION AND DRAINAGE PERIRECTAL ABSCESS N/A 06/05/2017   Procedure: IRRIGATION AND DEBRIDEMENT PERIRECTAL ABSCESS;  Surgeon: Andria Meuse, MD;  Location: [facility] OR;  Service: General;  Laterality: N/A;   SUBMUCOSAL TATTOO INJECTION  11/12/2020   Procedure: SUBMUCOSAL TATTOO INJECTION;  Surgeon: Lemar Lofty., MD;  Location: [facility] ENDOSCOPY;  Service: Gastroenterology;;   VIDEO BRONCHOSCOPY Bilateral 05/08/2016   Procedure: VIDEO BRONCHOSCOPY WITH FLUORO;  Surgeon: Oretha Milch, MD;  Location: [facility] ENDOSCOPY;  Service: Cardiopulmonary;  Laterality: Bilateral;    Social History   Socioeconomic History   Marital status: Single    Spouse name: Not on file   Number of children: 1   Years of education: Not on file   Highest education level: Not on file  Occupational History   Occupation: retired  Tobacco Use   Smoking status: Some Days    Packs/day: 2.00    Years: 47.00    Pack years: 94.00    Types: Cigarettes   Smokeless tobacco: Never  Vaping Use   Vaping Use: Never used  Substance and Sexual Activity   Alcohol use: Not Currently    Alcohol/week: 0.0 standard drinks    Comment: last drink was before xmas   Drug use: No  Types: Cocaine, Marijuana    Comment: "nothing in 20 years"   Sexual activity: Not Currently  Other Topics Concern   Not on file  Social History Narrative   unemployed   Social Determinants of Health   Financial Resource Strain: Low Risk    Difficulty of Paying Living Expenses: Not very hard  Food Insecurity: No Food Insecurity   Worried About Programme researcher, broadcasting/film/video in the Last Year: Never true   Ran Out of Food in the Last Year: Never true  Transportation Needs: No Transportation Needs   Lack of Transportation (Medical): No   Lack of Transportation (Non-Medical): No  Physical Activity: Inactive   Days of Exercise per Week: 0 days   Minutes of Exercise per Session: 0 min  Stress: No Stress Concern Present   Feeling of Stress : Not at all   Social Connections: Moderately Integrated   Frequency of Communication with Friends and Family: More than three times a week   Frequency of Social Gatherings with Friends and Family: Once a week   Attends Religious Services: More than 4 times per year   Active Member of [employer] or Organizations: Yes   Attends Engineer, structural: More than 4 times per year   Marital Status: Divorced  Catering manager Violence: Not At Risk   Fear of Current or Ex-Partner: No   Emotionally Abused: No   Physically Abused: No   Sexually Abused: No    Allergies  Allergen Reactions   Lisinopril Cough    Family History  Problem Relation Age of Onset   Heart disease Mother    Diabetes Mother    Colon cancer Mother    Liver cancer Mother    Cancer Father        type unknown   Diabetes Sister        x 2   Diabetes Brother     Prior to Admission medications   Medication Sig Start Date End Date Taking? Authorizing Provider  albuterol (PROVENTIL) (2.5 MG/3ML) 0.083% nebulizer solution Take 3 mLs (2.5 mg total) by nebulization every 4 (four) hours as needed for wheezing or shortness of breath. 11/01/20  Yes Beard, Samantha N, DO  albuterol (VENTOLIN HFA) 108 (90 Base) MCG/ACT inhaler INHALE 2 PUFFS INTO THE LUNGS EVERY 6 (SIX) HOURS AS NEEDED FOR WHEEZING OR SHORTNESS OF BREATH. 08/15/20 08/15/21 Yes Newlin, Enobong, MD  atorvastatin (LIPITOR) 80 MG tablet TAKE 1 TABLET EVERY DAY Patient taking differently: Take 80 mg by mouth daily. 12/21/20  Yes [name], MD  Budeson-Glycopyrrol-Formoterol 160-9-4.8 MCG/ACT AERO INHALE 2 PUFFS INTO THE LUNGS 2 (TWO) TIMES DAILY. 04/27/20 04/27/21 Yes Nyoka Cowden, MD  ferrous sulfate 325 (65 FE) MG tablet Take 1 tablet (325 mg total) by mouth 2 (two) times daily with a meal. 11/16/20  Yes Esaw Grandchild A, DO  fluticasone (FLONASE) 50 MCG/ACT nasal spray Place 1 spray into both nostrils daily. 01/11/21  Yes [name], MD  furosemide (LASIX) 40 MG tablet  TAKE 1 TABLET EVERY DAY Patient taking differently: Take 40 mg by mouth daily. 12/21/20  Yes Newlin, Odette Horns, MD  gabapentin (NEURONTIN) 300 MG capsule TAKE 1 CAPSULE (300 MG TOTAL) BY MOUTH AT BEDTIME. Patient taking differently: Take 300 mg by mouth at bedtime. 12/14/20 12/14/21 Yes [name], MD  Multiple Vitamin (MULTIVITAMIN WITH MINERALS) TABS tablet Take 1 tablet by mouth daily. 11/16/20  Yes Esaw Grandchild A, DO  nitroGLYCERIN (NITROSTAT) 0.4 MG SL tablet Place 1 tablet (0.4 mg  total) under the tongue every 5 (five) minutes as needed for chest pain. 11/19/20 11/19/21 Yes Albertine Grates, MD  XARELTO 20 MG TABS tablet TAKE 1 TABLET EVERY DAY WITH SUPPER Patient taking differently: Take 20 mg by mouth daily with supper. 12/21/20  Yes [name], MD  cetirizine (ZYRTEC) 10 MG tablet Take 1 tablet (10 mg total) by mouth daily. Patient not taking: Reported on 01/30/2021 01/11/21   [name], MD  Misc. Devices MISC Inhale 3 L into the lungs as needed. Portable oxygen concentrator. Diagnosis COPD. 11/19/20   Albertine Grates, MD  pantoprazole (PROTONIX) 40 MG tablet Take 1 tablet (40 mg total) by mouth daily before breakfast. Patient not taking: Reported on 01/30/2021 12/26/20 12/21/21  Meredith Pel, NP  TUDORZA PRESSAIR 400 MCG/ACT AEPB INHALE 1 PUFF INTO THE LUNGS IN THE MORNING AND AT BEDTIME. 03/29/20 04/04/20  Nyoka Cowden, MD    Physical Exam: Vitals:   01/30/21 1515 01/30/21 1530 01/30/21 1545 01/30/21 1618  BP: (!) 171/86 (!) 158/87 (!) 162/86 (!) 150/79  Pulse: (!) 103 100 (!) 104 89  Resp: (!) 25 (!) 25 (!) 29 20  Temp:      TempSrc:      SpO2: 100% 100% 100% 95%     General:  Appears calm and comfortable and is in NAD. Dyspneic  Eyes:  PERRL, EOMI, normal lids, iris ENT:  grossly normal hearing, lips & tongue, mmm; appropriate dentition Neck:  no LAD, masses or thyromegaly; no carotid bruits Cardiovascular:  RRR, no m/r/g. Bilateral 2+ pitting edema  Respiratory:   expiratory  wheezes anteriorly in bilateral bases. Faint. Tight with decreased air movement.  Abdomen:  soft, NT, ND, NABS Back:   normal alignment, no CVAT Skin:  no rash or induration seen on limited exam Musculoskeletal:  grossly normal tone BUE/BLE, good ROM, no bony abnormality Lower extremity:    Limited foot exam with no ulcerations.  2+ distal pulses. Psychiatric:  grossly normal mood and affect, speech fluent and appropriate, AOx3 Neurologic:  CN 2-12 grossly intact, moves all extremities in coordinated fashion, sensation intact    Radiological Exams on Admission: Independently reviewed - see discussion in A/P where applicable  DG Chest Portable 1 View  Result Date: 01/30/2021 CLINICAL DATA:  Hypoxia EXAM: PORTABLE CHEST 1 VIEW COMPARISON:  11/18/2020 FINDINGS: Cardiomegaly, which is stable to slightly increased in size. Aortic atherosclerosis. No focal pulmonary opacity. No significant pleural fluid. Redemonstrated healed left rib fractures. No acute osseous abnormality. IMPRESSION: 1. Stable to slightly increased cardiomegaly without evidence of pulmonary edema. 2.  Aortic Atherosclerosis (ICD10-I70.0). Electronically Signed   By: Wiliam Ke M.D.   On: 01/30/2021 12:28    EKG: Independently reviewed.  NSR with rate 89, prolonged QT; nonspecific ST changes with no evidence of acute ischemia   Labs on Admission: I have personally reviewed the available labs and imaging studies at the time of the admission.  Pertinent labs:  Bnp: 1093 hgb: 11.8 co2: 37, troponin: 40---> 42    Assessment/Plan Principal Problem:   Acute on chronic respiratory failure with hypoxia and hypercapnia (HCC) -65 year old male presenting to ER with oxygenation in the 70s on room air with CO2 on VBG of 90 -Likely secondary to COPD with acute exacerbation with possible concomitant CHF exacerbation -Place him on BiPAP per RT and follow VBG and clinical status.  -Wean as tolerated-Baseline oxygen is 4 L via  nasal cannula  Active Problems:   COPD with acute exacerbation (HCC) -Patient's shortness  of breath and productive cough are most likely caused by acute COPD exacerbation.  -he has history of O2-dependent COPD on 4L baseline oxygen via [place], now requiring bipap.  -Chest x-ray is not consistent with pneumonia -Nebulizers: scheduled Duoneb and prn albuterol -Continue IV Solu-Medrol 40 mg every 8 hours -IV cefepime secondary to recent antibiotic use for copd flair (has 3/3 cardinal symptoms)  -Continue Breztri     Acute on chronic combined systolic and diastolic CHF (congestive heart  failure) (HCC) -Patient with respiratory failure requiring BiPAP, increased lower extremity edema, elevated BNP; however, no signs of overt overload on CXR.  -Could have small exacerbation of his CHF or this could all be from a COPD exacerbation -Received 1 dose of IV Lasix 40 mg in ER.  We will continue this daily and adjust as needed.  -I's O's and daily weights -Echo was in November 2019 with an EF of 45 to 50% and grade 1 diastolic dysfunction -Needs a repeated echocardiogram and this has been ordered     Atrial flutter (HCC) -Rate controlled on no medication -Continue xarelto and telemetry    Elevated troponin -Troponin flat and likely secondary to demand ischemia -No endorsement of chest pain and EKG with no ST elevation -Continue to monitor and will he will be on telemetry  -Continue home medication    Essential hypertension -Slightly above goal and on no medication per his med rec -Continue to monitor    CAD (coronary artery disease) -No chest pain and mildly elevated troponins, but flat likely secondary to demand ischemia -Continue medical management with Lipitor    Cigarette smoker -Nicotine patch -Encourage cessation in light of severe COPD however he does not seem keen on quitting    Prediabetes A1c of 5.7 in June 2022    Iron deficiency anemia  -stable, continue iron daily    Prolonged qt -on telemetry, monitor electrolytes -avoid qt prolonging drugs   There is no height or weight on file to calculate BMI.   Level of care: Progressive DVT prophylaxis: xarelto  Code Status:  Full - confirmed with patient  Family Communication: None present Disposition Plan:  The patient is from: home  Anticipated d/c is to: home  Requires inpatient hospitalization and is at significant risk of worsening, requires constant monitoring, assessment and MDM with specialists.  Patient is currently: acutely ill Consults called: none   Admission status:  inpatient    Orland Mustard MD [facility]   How to contact the [facility] Attending or Consulting provider 7A - 7P or covering provider during after hours 7P -7A, for this patient?  Check the care team in [facility] and look for a) attending/consulting [facility] provider listed and b) the [facility] team listed Log into [email or web address] and use [place] universal password to access. If you do not have the password, please contact the hospital operator. Locate the [facility] provider you are looking for under [facility] and page to a number that you can be directly reached. If you still have difficulty reaching the provider, please page the [facility] (Director on Call) for the Hospitalists listed on amion for assistance.   01/30/2021, 4:36 PM

## 2021-01-30 NOTE — ED Notes (Signed)
Xray at BS 

## 2021-01-30 NOTE — ED Notes (Signed)
VBG repeated and showed worsening acidosis and increasing CO2, RT called to place him back on BiPAP as he had taken it off previously.

## 2021-01-30 NOTE — ED Notes (Signed)
SpO2 87-89% on Bipap with FiO2 at 35%. RT notified and increase FiO2 to 48%. SpO2 now 90%. Chronic COPD and retaining CO2; will try to keep SpO2 88-92%.

## 2021-01-30 NOTE — ED Notes (Signed)
RT at BS.

## 2021-01-30 NOTE — Progress Notes (Signed)
Cardiology Office Note   Date:  01/30/2021   ID:  Jared [name], DOB 01-May-1956, MRN [id number]  PCP:  [name], MD  Cardiologist:   Dietrich Pates, MD   Patient presents for f/u of CHF and atrial flutter      History of Present Illness: Jared [name] is a 65 y.o. male with a history of  CAD (mild), chronic combined systolic and diastolic heart failure, history of CVA, hypertension, hyperlipidemia, paroxysmal atrial flutter on Xarelto, COPD on oxygen and tobacco abuse. EF 30-35% in 2018, up to 45-50% by echo in 04/2018.  I saw the patient last in [place] in Aug 2021  The pt was seen by Jerilee Hoh   in the fall   Was on 2 L at that time     The pt had GI bleeding back in the spring   Underwent GI evaluation  The pt notes increased SOB over the summer    Dialed up O2 to 4 L     Not active   Sleeps thorughout day     Denies Cp    Some dizziness but no recent syncope  Has some swelling in legs     Denies salt intake in excess Smoking up to 1/2 ppd   Current Meds  Medication Sig   albuterol (PROVENTIL) (2.5 MG/3ML) 0.083% nebulizer solution Take 3 mLs (2.5 mg total) by nebulization every 4 (four) hours as needed for wheezing or shortness of breath.   albuterol (VENTOLIN HFA) 108 (90 Base) MCG/ACT inhaler INHALE 2 PUFFS INTO THE LUNGS EVERY 6 (SIX) HOURS AS NEEDED FOR WHEEZING OR SHORTNESS OF BREATH.   atorvastatin (LIPITOR) 80 MG tablet TAKE 1 TABLET EVERY DAY   Budeson-Glycopyrrol-Formoterol 160-9-4.8 MCG/ACT AERO INHALE 2 PUFFS INTO THE LUNGS 2 (TWO) TIMES DAILY.   cetirizine (ZYRTEC) 10 MG tablet Take 1 tablet (10 mg total) by mouth daily.   ferrous sulfate 325 (65 FE) MG tablet Take 1 tablet (325 mg total) by mouth 2 (two) times daily with a meal.   fluticasone (FLONASE) 50 MCG/ACT nasal spray Place 1 spray into both nostrils daily.   furosemide (LASIX) 40 MG tablet TAKE 1 TABLET EVERY DAY   gabapentin (NEURONTIN) 300 MG capsule TAKE 1 CAPSULE (300 MG TOTAL) BY MOUTH AT BEDTIME.    Misc. Devices MISC Inhale 3 L into the lungs as needed. Portable oxygen concentrator. Diagnosis COPD.   Multiple Vitamin (MULTIVITAMIN WITH MINERALS) TABS tablet Take 1 tablet by mouth daily.   nitroGLYCERIN (NITROSTAT) 0.4 MG SL tablet Place 1 tablet (0.4 mg total) under the tongue every 5 (five) minutes as needed for chest pain.   pantoprazole (PROTONIX) 40 MG tablet Take 1 tablet (40 mg total) by mouth daily before breakfast.   XARELTO 20 MG TABS tablet TAKE 1 TABLET EVERY DAY WITH SUPPER     Allergies:   Lisinopril   Past Medical History:  Diagnosis Date   AVM (arteriovenous malformation)    CAD (coronary artery disease)    a. LHC 5/12:  LAD 20, pLCx 20, pRCA 40, dRCA 40, EF 35%, diff HK  //  b. Myoview 4/16: Overall Impression:  High risk stress nuclear study There is no evidence of ischemia.  There is severe LV dysfunction. LV Ejection Fraction: 30%.  LV Wall Motion:  There is global LV hypokinesis.     CAP (community acquired pneumonia) 09/2013   Chronic combined systolic and diastolic CHF (congestive heart failure) (HCC)    a. Echo 4/16:Mild LVH, EF 40-45%,  diffuse HK //  b. Echo 8/17: EF 35-40%, diffuse HK, diastolic dysfunction, aortic sclerosis, trivial MR, moderate LAE, normal RVSF, moderate RAE, mild TR, PASP 42 mmHg // c. Echo 4/18: Mild concentric LVH, EF 30-35, normal wall motion, grade 1 diastolic dysfunction, PASP 49   Chronic respiratory failure (HCC)    Cluster headache    "hx; haven't had one in awhile" (01/09/2016)   COPD (chronic obstructive pulmonary disease) (HCC)    [name] 01/09/2016   History of CVA (cerebrovascular accident)    Hypertension    IDA (iron deficiency anemia)    Moderate tobacco use disorder    NICM (nonischemic cardiomyopathy) (HCC)    Nicotine addiction    Tobacco abuse     Past Surgical History:  Procedure Laterality Date   BIOPSY  11/12/2020   Procedure: BIOPSY;  Surgeon: Lemar Lofty., MD;  Location: [facility] ENDOSCOPY;  Service:  Gastroenterology;;   CARDIAC CATHETERIZATION  10/2010   LM normal, LAD with 20% irregularities, LCX with 20%, RCA with 40% prox and 40% distal - EF of 35%   CATARACT EXTRACTION, BILATERAL     COLONOSCOPY W/ BIOPSIES AND POLYPECTOMY     COLONOSCOPY WITH PROPOFOL N/A 09/06/2018   Procedure: COLONOSCOPY WITH PROPOFOL;  Surgeon: Tressia Danas, MD;  Location: [facility] ENDOSCOPY;  Service: Gastroenterology;  Laterality: N/A;   ENTEROSCOPY N/A 09/28/2018   Procedure: ENTEROSCOPY;  Surgeon: Jeani Hawking, MD;  Location: [facility] ENDOSCOPY;  Service: Endoscopy;  Laterality: N/A;   ENTEROSCOPY N/A 10/28/2018   Procedure: ENTEROSCOPY;  Surgeon: Tressia Danas, MD;  Location: [facility] ENDOSCOPY;  Service: Gastroenterology;  Laterality: N/A;   ENTEROSCOPY N/A 10/09/2020   Procedure: ENTEROSCOPY;  Surgeon: Hilarie Fredrickson, MD;  Location: [facility] ENDOSCOPY;  Service: Endoscopy;  Laterality: N/A;   ESOPHAGOGASTRODUODENOSCOPY N/A 11/12/2020   Procedure: ESOPHAGOGASTRODUODENOSCOPY (EGD);  Surgeon: Lemar Lofty., MD;  Location: [facility] ENDOSCOPY;  Service: Gastroenterology;  Laterality: N/A;   ESOPHAGOGASTRODUODENOSCOPY (EGD) WITH PROPOFOL N/A 09/05/2018   Procedure: ESOPHAGOGASTRODUODENOSCOPY (EGD) WITH PROPOFOL;  Surgeon: Benancio Deeds, MD;  Location: [facility] ENDOSCOPY;  Service: Gastroenterology;  Laterality: N/A;   ESOPHAGOGASTRODUODENOSCOPY (EGD) WITH PROPOFOL N/A 11/19/2020   Procedure: ESOPHAGOGASTRODUODENOSCOPY (EGD) WITH PROPOFOL;  Surgeon: Beverley Fiedler, MD;  Location: [facility] ENDOSCOPY;  Service: Gastroenterology;  Laterality: N/A;   EXCISION MASS HEAD     GIVENS CAPSULE STUDY N/A 09/06/2018   Procedure: GIVENS CAPSULE STUDY;  Surgeon: Tressia Danas, MD;  Location: [facility] ENDOSCOPY;  Service: Gastroenterology;  Laterality: N/A;   GIVENS CAPSULE STUDY N/A 09/26/2018   Procedure: GIVENS CAPSULE STUDY;  Surgeon: Beverley Fiedler, MD;  Location: [facility] ENDOSCOPY;  Service: Gastroenterology;  Laterality: N/A;   GIVENS CAPSULE STUDY N/A  06/14/2019   Procedure: GIVENS CAPSULE STUDY;  Surgeon: Napoleon Form, MD;  Location: [facility] ENDOSCOPY;  Service: Endoscopy;  Laterality: N/A;   HEMOSTASIS CLIP PLACEMENT  11/12/2020   Procedure: HEMOSTASIS CLIP PLACEMENT;  Surgeon: Lemar Lofty., MD;  Location: [facility] ENDOSCOPY;  Service: Gastroenterology;;   HEMOSTASIS CONTROL  11/12/2020   Procedure: HEMOSTASIS CONTROL;  Surgeon: Lemar Lofty., MD;  Location: [facility] ENDOSCOPY;  Service: Gastroenterology;;   HOT HEMOSTASIS N/A 10/28/2018   Procedure: HOT HEMOSTASIS (ARGON PLASMA COAGULATION/BICAP);  Surgeon: Tressia Danas, MD;  Location: [facility] ENDOSCOPY;  Service: Gastroenterology;  Laterality: N/A;   HOT HEMOSTASIS N/A 11/12/2020   Procedure: HOT HEMOSTASIS (ARGON PLASMA COAGULATION/BICAP);  Surgeon: Lemar Lofty., MD;  Location: [facility] ENDOSCOPY;  Service: Gastroenterology;  Laterality: N/A;   INCISION AND DRAINAGE PERIRECTAL ABSCESS N/A  06/05/2017   Procedure: IRRIGATION AND DEBRIDEMENT PERIRECTAL ABSCESS;  Surgeon: Andria Meuse, MD;  Location: [facility] OR;  Service: General;  Laterality: N/A;   SUBMUCOSAL TATTOO INJECTION  11/12/2020   Procedure: SUBMUCOSAL TATTOO INJECTION;  Surgeon: Lemar Lofty., MD;  Location: [facility] ENDOSCOPY;  Service: Gastroenterology;;   VIDEO BRONCHOSCOPY Bilateral 05/08/2016   Procedure: VIDEO BRONCHOSCOPY WITH FLUORO;  Surgeon: Oretha Milch, MD;  Location: [facility] ENDOSCOPY;  Service: Cardiopulmonary;  Laterality: Bilateral;     Social History:  The patient  reports that he has been smoking cigarettes. He has a 94.00 pack-year smoking history. He has never used smokeless tobacco. He reports that he does not currently use alcohol. He reports that he does not use drugs.   Family History:  The patient's family history includes Cancer in his father; Colon cancer in his mother; Diabetes in his brother, mother, and sister; Heart disease in his mother; Liver cancer in his mother.    ROS:  Please see  the history of present illness. All other systems are reviewed and  Negative to the above problem except as noted.    PHYSICAL EXAM: VS:  BP 130/68   Pulse 90   Ht 6\' 3"  (1.905 m)   Wt 228 lb 3.2 oz (103.5 kg)   SpO2 (!) 77% Comment: on 4L oxygen  BMI 28.52 kg/m    Sats increaed to 85 transiently with deep breathing then back to 80% GEN: Well nourished, well developed, HEENT: normal  Neck:Neck is full but JVP appears increased Cardiac: RRR; no murmurs   1+ LE edema  Respiratory: Decreased airflow bilaterally.  Severe   Mild wheeze   No rales   GI: soft   Mild RUQ tenderness   MS: no deformity Moving all extremities   Skin: warm and dry, no rash Neuro:  Strength and sensation are intact Psych: euthymic mood, full affect   EKG:  EKG is not ordered today.  Echo:   04/2018 - Left ventricle: The cavity size was normal. Wall thickness was    normal. Systolic function was mildly reduced. The estimated    ejection fraction was in the range of 45% to 50%. Wall motion was    normal; there were no regional wall motion abnormalities. Doppler    parameters are consistent with abnormal left ventricular    relaxation (grade 1 diastolic dysfunction).  - Left atrium: The atrium was mildly dilated.   Impressions:   - EF has improved when compared to prior (35%)  Lipid Panel    Component Value Date/Time   CHOL 148 01/29/2020 1020   TRIG 138 01/29/2020 1020   HDL 51 01/29/2020 1020   CHOLHDL 2.9 01/29/2020 1020   CHOLHDL 2.8 01/11/2016 0446   VLDL 14 01/11/2016 0446   LDLCALC 73 01/29/2020 1020      Wt Readings from Last 3 Encounters:  01/30/21 228 lb 3.2 oz (103.5 kg)  01/06/21 214 lb (97.1 kg)  12/21/20 212 lb (96.2 kg)      ASSESSMENT AND PLAN:  1  Hypoxia   Significant   ON 4L   sats 77 to 81       Exam signif for decreaed airflow and some volume increase   Probable combination of COPD exacerbation with volume increase Would recomm transport to [facility] ED for care  ER MD  notified    1.  Chronic systolic CHF.  Echo in past LVEF was 30 to 35% 2018.  In 2019 was 45 to 50%.  Volume status is increased  Will tx to ED   Would give IV lasix  Repeat Echo     2. COPD  On 4L    Transfer to [facility] ED for care   Should have pulmonary see pt  3   Atrial flutter.  Continue Xarelto.  Would recomm tele     3 CAD.  No significant chest pain.    4 hypertension. BP is OK     6.  Dyslipidemia.  Contiue lipitor    7.  History of CVA.  Limit time off of anticoagulation.   8   Anemia   Will need CBC in ED  9  GI  Denies bleeding       Current medicines are reviewed at length with the patient today.  The patient does not have concerns regarding medicines.  Signed, Dietrich Pates, MD  01/30/2021 9:48 AM    [facility] [address] [place], [place], [place]  [address] Phone: [phone]; Fax: [phone]

## 2021-01-30 NOTE — Patient Instructions (Signed)
Please go straight to the ER.

## 2021-01-30 NOTE — ED Notes (Addendum)
SPO2 remains low on Venturi mask 84% , switched to NRB, EDP and RT notified.

## 2021-01-30 NOTE — ED Triage Notes (Signed)
To ED from Dr Tenny Craw (cardiology) office. Seen there today for scheduled appt and found to be sob with need to increase chronic O2 [place] from 2L to 4L. Pt states this sob started a few weeks ago. No known fever. Denies cough. Denies cp. Initially, EMS states pt was unable to speak in sentences. On arrival to the ED pt able to speak in sentences but with short words. Skin w/d. Arrived on NRB at 8L. Removed and placed on [place] at 4L.

## 2021-01-30 NOTE — ED Notes (Signed)
Admitting MD at [facility]. RT at [facility] applying bipap. Guidelines and parameters discussed. Pt agreeable. Placed on external pure wick catheter. Clothing removed. Gown changed.

## 2021-01-30 NOTE — ED Provider Notes (Signed)
[facility]   CSN: [id number] Arrival date & time: 01/30/21  1035     History Chief Complaint  Patient presents with   Shortness of Breath    Haynes DageJohnny [name] is a 65 y.o. male.  The history is provided by the patient and medical records. No language interpreter was used.  Shortness of Breath Severity:  Moderate Onset quality:  Gradual Duration:  1 week Timing:  Constant Progression:  Worsening Chronicity:  New Context: not URI   Relieved by:  Nothing Worsened by:  Exertion Ineffective treatments:  None tried Associated symptoms: wheezing   Associated symptoms: no abdominal pain, no chest pain, no cough, no diaphoresis, no fever, no headaches, no neck pain, no rash, no sputum production and no vomiting       Past Medical History:  Diagnosis Date   AVM (arteriovenous malformation)    CAD (coronary artery disease)    a. LHC 5/12:  LAD 20, pLCx 20, pRCA 40, dRCA 40, EF 35%, diff HK  //  b. Myoview 4/16: Overall Impression:  High risk stress nuclear study There is no evidence of ischemia.  There is severe LV dysfunction. LV Ejection Fraction: 30%.  LV Wall Motion:  There is global LV hypokinesis.     CAP (community acquired pneumonia) 09/2013   Chronic combined systolic and diastolic CHF (congestive heart failure) (HCC)    a. Echo 4/16:Mild LVH, EF 40-45%, diffuse HK //  b. Echo 8/17: EF 35-40%, diffuse HK, diastolic dysfunction, aortic sclerosis, trivial MR, moderate LAE, normal RVSF, moderate RAE, mild TR, PASP 42 mmHg // c. Echo 4/18: Mild concentric LVH, EF 30-35, normal wall motion, grade 1 diastolic dysfunction, PASP 49   Chronic respiratory failure (HCC)    Cluster headache    "hx; haven't had one in awhile" (01/09/2016)   COPD (chronic obstructive pulmonary disease) (HCC)    [name]/notes 01/09/2016   History of CVA (cerebrovascular accident)    Hypertension    IDA (iron deficiency anemia)    Moderate tobacco use disorder     NICM (nonischemic cardiomyopathy) (HCC)    Nicotine addiction    Tobacco abuse     Patient Active Problem List   Diagnosis Date Noted   History of GI bleed    Gastric ulcer due to Helicobacter pylori and nonsteroidal anti-inflammatory drug (NSAID)    AKI (acute kidney injury) (HCC) 11/18/2020   Epigastric pain    Acute gastric ulcer with hemorrhage    Acute blood loss anemia    Acute upper GI bleed 11/12/2020   Hematemesis with nausea    Acute GI bleeding 06/11/2019   Syncope 06/11/2019   Chronic right maxillary sinusitis 01/29/2019   Chronic respiratory failure with hypoxia and hypercapnia (HCC) 01/01/2019   Acute on chronic respiratory failure with hypoxia and hypercapnia (HCC) 11/10/2018   GI bleed 10/27/2018   Chronic anticoagulation 09/27/2018   AV malformation of gastrointestinal tract    Occult GI bleeding    Heme positive stool    Iron deficiency anemia    Acute on chronic anemia 09/04/2018   Noncompliance 06/14/2017   Elevated LFTs 08/13/2016   Chronic respiratory failure with hypoxia (HCC) 07/24/2016   Atrial flutter (HCC)    Hepatic congestion 07/02/2016   Chronic combined systolic and diastolic CHF (congestive heart failure) (HCC) 07/01/2016   COPD with acute exacerbation (HCC) 06/03/2016   Prediabetes 05/14/2016   Hemoptysis 05/06/2016   COPD GOLD III/still smoking  02/29/2016   Cigarette smoker  01/09/2016   CAD (coronary artery disease) 10/07/2013   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   Centrilobular emphysema (HCC) 10/04/2013   Essential hypertension     Past Surgical History:  Procedure Laterality Date   BIOPSY  11/12/2020   Procedure: BIOPSY;  Surgeon: Lemar Lofty., MD;  Location: [facility] ENDOSCOPY;  Service: Gastroenterology;;   CARDIAC CATHETERIZATION  10/2010   LM normal, LAD with 20% irregularities, LCX with 20%, RCA with 40% prox and 40% distal - EF of 35%   CATARACT EXTRACTION, BILATERAL     COLONOSCOPY W/ BIOPSIES AND  POLYPECTOMY     COLONOSCOPY WITH PROPOFOL N/A 09/06/2018   Procedure: COLONOSCOPY WITH PROPOFOL;  Surgeon: Tressia Danas, MD;  Location: [facility] ENDOSCOPY;  Service: Gastroenterology;  Laterality: N/A;   ENTEROSCOPY N/A 09/28/2018   Procedure: ENTEROSCOPY;  Surgeon: Jeani Hawking, MD;  Location: [facility] ENDOSCOPY;  Service: Endoscopy;  Laterality: N/A;   ENTEROSCOPY N/A 10/28/2018   Procedure: ENTEROSCOPY;  Surgeon: Tressia Danas, MD;  Location: [facility] ENDOSCOPY;  Service: Gastroenterology;  Laterality: N/A;   ENTEROSCOPY N/A 10/09/2020   Procedure: ENTEROSCOPY;  Surgeon: Hilarie Fredrickson, MD;  Location: [facility] ENDOSCOPY;  Service: Endoscopy;  Laterality: N/A;   ESOPHAGOGASTRODUODENOSCOPY N/A 11/12/2020   Procedure: ESOPHAGOGASTRODUODENOSCOPY (EGD);  Surgeon: Lemar Lofty., MD;  Location: [facility] ENDOSCOPY;  Service: Gastroenterology;  Laterality: N/A;   ESOPHAGOGASTRODUODENOSCOPY (EGD) WITH PROPOFOL N/A 09/05/2018   Procedure: ESOPHAGOGASTRODUODENOSCOPY (EGD) WITH PROPOFOL;  Surgeon: Benancio Deeds, MD;  Location: [facility] ENDOSCOPY;  Service: Gastroenterology;  Laterality: N/A;   ESOPHAGOGASTRODUODENOSCOPY (EGD) WITH PROPOFOL N/A 11/19/2020   Procedure: ESOPHAGOGASTRODUODENOSCOPY (EGD) WITH PROPOFOL;  Surgeon: Beverley Fiedler, MD;  Location: [facility] ENDOSCOPY;  Service: Gastroenterology;  Laterality: N/A;   EXCISION MASS HEAD     GIVENS CAPSULE STUDY N/A 09/06/2018   Procedure: GIVENS CAPSULE STUDY;  Surgeon: Tressia Danas, MD;  Location: [facility] ENDOSCOPY;  Service: Gastroenterology;  Laterality: N/A;   GIVENS CAPSULE STUDY N/A 09/26/2018   Procedure: GIVENS CAPSULE STUDY;  Surgeon: Beverley Fiedler, MD;  Location: [facility] ENDOSCOPY;  Service: Gastroenterology;  Laterality: N/A;   GIVENS CAPSULE STUDY N/A 06/14/2019   Procedure: GIVENS CAPSULE STUDY;  Surgeon: Napoleon Form, MD;  Location: [facility] ENDOSCOPY;  Service: Endoscopy;  Laterality: N/A;   HEMOSTASIS CLIP PLACEMENT  11/12/2020   Procedure: HEMOSTASIS CLIP PLACEMENT;   Surgeon: Lemar Lofty., MD;  Location: [facility] ENDOSCOPY;  Service: Gastroenterology;;   HEMOSTASIS CONTROL  11/12/2020   Procedure: HEMOSTASIS CONTROL;  Surgeon: Lemar Lofty., MD;  Location: [facility] ENDOSCOPY;  Service: Gastroenterology;;   HOT HEMOSTASIS N/A 10/28/2018   Procedure: HOT HEMOSTASIS (ARGON PLASMA COAGULATION/BICAP);  Surgeon: Tressia Danas, MD;  Location: [facility] ENDOSCOPY;  Service: Gastroenterology;  Laterality: N/A;   HOT HEMOSTASIS N/A 11/12/2020   Procedure: HOT HEMOSTASIS (ARGON PLASMA COAGULATION/BICAP);  Surgeon: Lemar Lofty., MD;  Location: [facility] ENDOSCOPY;  Service: Gastroenterology;  Laterality: N/A;   INCISION AND DRAINAGE PERIRECTAL ABSCESS N/A 06/05/2017   Procedure: IRRIGATION AND DEBRIDEMENT PERIRECTAL ABSCESS;  Surgeon: Andria Meuse, MD;  Location: [facility] OR;  Service: General;  Laterality: N/A;   SUBMUCOSAL TATTOO INJECTION  11/12/2020   Procedure: SUBMUCOSAL TATTOO INJECTION;  Surgeon: Lemar Lofty., MD;  Location: [facility] ENDOSCOPY;  Service: Gastroenterology;;   VIDEO BRONCHOSCOPY Bilateral 05/08/2016   Procedure: VIDEO BRONCHOSCOPY WITH FLUORO;  Surgeon: Oretha Milch, MD;  Location: [facility] ENDOSCOPY;  Service: Cardiopulmonary;  Laterality: Bilateral;       Family History  Problem Relation Age of Onset   Heart  disease Mother    Diabetes Mother    Colon cancer Mother    Liver cancer Mother    Cancer Father        type unknown   Diabetes Sister        x 2   Diabetes Brother     Social History   Tobacco Use   Smoking status: Some Days    Packs/day: 2.00    Years: 47.00    Pack years: 94.00    Types: Cigarettes   Smokeless tobacco: Never  Vaping Use   Vaping Use: Never used  Substance Use Topics   Alcohol use: Not Currently    Alcohol/week: 0.0 standard drinks    Comment: last drink was before xmas   Drug use: No    Types: Cocaine, Marijuana    Comment: "nothing in 20 years"    Home Medications Prior to Admission  medications   Medication Sig Start Date End Date Taking? Authorizing Provider  albuterol (PROVENTIL) (2.5 MG/3ML) 0.083% nebulizer solution Take 3 mLs (2.5 mg total) by nebulization every 4 (four) hours as needed for wheezing or shortness of breath. 11/01/20   Allayne Stack, DO  albuterol (VENTOLIN HFA) 108 (90 Base) MCG/ACT inhaler INHALE 2 PUFFS INTO THE LUNGS EVERY 6 (SIX) HOURS AS NEEDED FOR WHEEZING OR SHORTNESS OF BREATH. 08/15/20 08/15/21  [name], MD  atorvastatin (LIPITOR) 80 MG tablet TAKE 1 TABLET EVERY DAY 12/21/20   [name], MD  Budeson-Glycopyrrol-Formoterol 160-9-4.8 MCG/ACT AERO INHALE 2 PUFFS INTO THE LUNGS 2 (TWO) TIMES DAILY. 04/27/20 04/27/21  Nyoka Cowden, MD  cetirizine (ZYRTEC) 10 MG tablet Take 1 tablet (10 mg total) by mouth daily. 01/11/21   [name], MD  ferrous sulfate 325 (65 FE) MG tablet Take 1 tablet (325 mg total) by mouth 2 (two) times daily with a meal. 11/16/20   Esaw Grandchild A, DO  fluticasone (FLONASE) 50 MCG/ACT nasal spray Place 1 spray into both nostrils daily. 01/11/21   [name], MD  furosemide (LASIX) 40 MG tablet TAKE 1 TABLET EVERY DAY 12/21/20   [name], MD  gabapentin (NEURONTIN) 300 MG capsule TAKE 1 CAPSULE (300 MG TOTAL) BY MOUTH AT BEDTIME. 12/14/20 12/14/21  [name], MD  Misc. Devices MISC Inhale 3 L into the lungs as needed. Portable oxygen concentrator. Diagnosis COPD. 11/19/20   Albertine Grates, MD  Multiple Vitamin (MULTIVITAMIN WITH MINERALS) TABS tablet Take 1 tablet by mouth daily. 11/16/20   Pennie Banter, DO  nitroGLYCERIN (NITROSTAT) 0.4 MG SL tablet Place 1 tablet (0.4 mg total) under the tongue every 5 (five) minutes as needed for chest pain. 11/19/20 11/19/21  Albertine Grates, MD  pantoprazole (PROTONIX) 40 MG tablet Take 1 tablet (40 mg total) by mouth daily before breakfast. 12/26/20 12/21/21  Meredith Pel, NP  XARELTO 20 MG TABS tablet TAKE 1 TABLET EVERY DAY WITH SUPPER 12/21/20   [name], MD   TUDORZA PRESSAIR 400 MCG/ACT AEPB INHALE 1 PUFF INTO THE LUNGS IN THE MORNING AND AT BEDTIME. 03/29/20 04/04/20  Nyoka Cowden, MD    Allergies    Lisinopril  Review of Systems   Review of Systems  Constitutional:  Negative for chills, diaphoresis, fatigue and fever.  HENT:  Negative for congestion.   Eyes:  Negative for visual disturbance.  Respiratory:  Positive for chest tightness, shortness of breath and wheezing. Negative for cough, sputum production and stridor.   Cardiovascular:  Positive for leg swelling (at baseline per pt).  Negative for chest pain and palpitations.  Gastrointestinal:  Negative for abdominal pain, constipation, diarrhea, nausea and vomiting.  Genitourinary:  Negative for dysuria, flank pain and frequency.  Musculoskeletal:  Negative for back pain, neck pain and neck stiffness.  Skin:  Negative for rash.  Neurological:  Negative for light-headedness and headaches.  Psychiatric/Behavioral:  Negative for agitation.   All other systems reviewed and are negative.  Physical Exam Updated Vital Signs BP (!) 150/79   Pulse 89   Temp 98.6 F (37 C) (Oral)   Resp 20   SpO2 95%   Physical Exam Vitals and nursing note reviewed.  Constitutional:      General: He is not in acute distress.    Appearance: He is well-developed. He is not ill-appearing, toxic-appearing or diaphoretic.  HENT:     Head: Normocephalic and atraumatic.  Eyes:     Conjunctiva/sclera: Conjunctivae normal.     Pupils: Pupils are equal, round, and reactive to light.  Cardiovascular:     Rate and Rhythm: Normal rate and regular rhythm.     Heart sounds: No murmur heard. Pulmonary:     Effort: Pulmonary effort is normal. Tachypnea present. No respiratory distress.     Breath sounds: Wheezing and rales present. No decreased breath sounds or rhonchi.  Chest:     Chest wall: No tenderness.  Abdominal:     Palpations: Abdomen is soft.     Tenderness: There is no abdominal tenderness.   Musculoskeletal:     Cervical back: Neck supple.     Right lower leg: No tenderness. Edema present.     Left lower leg: No tenderness. Edema present.  Skin:    General: Skin is warm and dry.     Capillary Refill: Capillary refill takes less than 2 seconds.     Findings: No erythema.  Neurological:     Mental Status: He is alert.  Psychiatric:        Mood and Affect: Mood normal.    ED Results / Procedures / Treatments   Labs (all labs ordered are listed, but only abnormal results are displayed) Labs Reviewed  CBC WITH DIFFERENTIAL/PLATELET - Abnormal; Notable for the following components:      Result Value   Hemoglobin 11.8 (*)    MCH 22.8 (*)    MCHC 25.1 (*)    RDW 16.9 (*)    nRBC 0.3 (*)    All other components within normal limits  COMPREHENSIVE METABOLIC PANEL - Abnormal; Notable for the following components:   CO2 37 (*)    Glucose, Bld 104 (*)    Calcium 8.6 (*)    All other components within normal limits  BRAIN NATRIURETIC PEPTIDE - Abnormal; Notable for the following components:   B Natriuretic Peptide 1,093.1 (*)    All other components within normal limits  I-STAT VENOUS BLOOD GAS, ED - Abnormal; Notable for the following components:   pCO2, Ven 90.6 (*)    pO2, Ven 148.0 (*)    Bicarbonate 43.8 (*)    TCO2 46 (*)    Acid-Base Excess 12.0 (*)    Calcium, Ion 1.05 (*)    All other components within normal limits  TROPONIN I (HIGH SENSITIVITY) - Abnormal; Notable for the following components:   Troponin I (High Sensitivity) 40 (*)    All other components within normal limits  TROPONIN I (HIGH SENSITIVITY) - Abnormal; Notable for the following components:   Troponin I (High Sensitivity) 42 (*)    All  other components within normal limits  RESP PANEL BY RT-PCR (FLU A&B, COVID) [id number]  LACTIC ACID, PLASMA  URINALYSIS, ROUTINE W REFLEX MICROSCOPIC  BLOOD GAS, VENOUS  CBC  BASIC METABOLIC PANEL    EKG EKG Interpretation  Date/Time:  Monday January 30 2021 10:35:03 EDT Ventricular Rate:  89 PR Interval:  136 QRS Duration: 95 QT Interval:  412 QTC Calculation: 502 R Axis:   246 Text Interpretation: Age not entered, assumed to be  65 years old for purpose of ECG interpretation Sinus rhythm Multiform ventricular premature complexes Inferior infarct, old Prolonged QT interval When compared to prior, longer QTc ad more PVC. No sTEMI Confirmed by Theda Belfast ([id number]) on 01/30/2021 5:21:13 PM  Radiology DG Chest Portable 1 View  Result Date: 01/30/2021 CLINICAL DATA:  Hypoxia EXAM: PORTABLE CHEST 1 VIEW COMPARISON:  11/18/2020 FINDINGS: Cardiomegaly, which is stable to slightly increased in size. Aortic atherosclerosis. No focal pulmonary opacity. No significant pleural fluid. Redemonstrated healed left rib fractures. No acute osseous abnormality. IMPRESSION: 1. Stable to slightly increased cardiomegaly without evidence of pulmonary edema. 2.  Aortic Atherosclerosis (ICD10-I70.0). Electronically Signed   By: Wiliam Ke M.D.   On: 01/30/2021 12:28    Procedures Procedures   CRITICAL CARE Performed by: Canary Brim Vernetta Dizdarevic Total critical care time: 35 minutes Critical care time was exclusive of separately billable procedures and treating other patients. Critical care was necessary to treat or prevent imminent or life-threatening deterioration. Critical care was time spent personally by me on the following activities: development of treatment plan with patient and/or surrogate as well as nursing, discussions with consultants, evaluation of patient's response to treatment, examination of patient, obtaining history from patient or surrogate, ordering and performing treatments and interventions, ordering and review of laboratory studies, ordering and review of radiographic studies, pulse oximetry and re-evaluation of patient's condition.   Medications Ordered in ED Medications - No data to display  ED Course  I have reviewed the triage vital  signs and the nursing notes.  Pertinent labs & imaging results that were available during my care of the patient were reviewed by me and considered in my medical decision making (see chart for details).    MDM Rules/Calculators/A&P                           Jared [name] is a 65 y.o. male with a past medical history significant for COPD, atrial flutter on Xarelto, CAD, CHF, prior GI bleed, and stroke who presents from cardiology clinic for hypoxia and shortness of breath.  I spoke with the cardiologist on the phone this morning who reports that they were seeing the patient in follow-up as he has had around 1 week of shortness of breath.  They report his oxygen saturations were in the 70s on 4 L which he normally is on 2 L at home at baseline.  They increased him to 8 L by nonrebreather and called ambulance to send him in for evaluation.  He reports that he has not had any chest pain or palpitations but reports some wheezing, shortness of breath, and fatigue.  He denies any new leg pain or leg swelling but reports chronic edema.  He denies any new trauma.  Denies any fevers, chills, ingestion, or cough.  The cardiology team felt this was either CHF exacerbation versus COPD exacerbation but felt he needed transfer for likely admission.  He reports he has been taking his Xarelto as directed  On arrival, patient was weaned back down to 4 L nasal cannula to attain oxygen saturations in the 90s.  On exam, his lungs were both filled with wheezing and some rales.  No rhonchi.  No chest or back tenderness.  Legs are mildly edematous which he reports is unchanged from his baseline.  Patient otherwise resting comfortably.  EKG does not show STEMI.  Given the patient's increased oxygen requirement and hypoxia cardiology office, I am concerned about sending him home.  We will give albuterol and steroids to help with the wheezing side of things but we will get a BNP and other work-up to look for other etiologies of  his hypoxia and shortness of breath.  As he is on blood thinners and is denying any new leg pain, leg swelling, or chest discomfort, low suspicion for a thromboembolic etiology of symptoms.  Anticipate reassessment and likely admission after work-up is completed.  2:01 PM Work-up is again to return.  Troponin is elevated, will trend.  BNP is over thousand from 700 previously.  Patient is starting to breathe better after DuoNeb and steroids and magnesium however his oxygen saturation is still about 91%.  He will need admission.  I suspect this is a combination of CHF and COPD exacerbation.  We will give IV Lasix as his kidney functions are normal.  COVID and flu test negative.  Urinalysis does not show significant proteinuria or other abdomen.  He is not septic and lactic acid is normal and white blood cell count is normal.   Will call for admission for a CHF/COPD exacerbation requiring increased oxygen requirements and increased work of breathing.   CO2 elevated, hospitalist aware who requested bipap which was ordered.    Final Clinical Impression(s) / ED Diagnoses Final diagnoses:  Hypoxia   Clinical Impression: 1. Hypoxia     Disposition: Admit  This note was prepared with assistance of Dragon voice recognition software. Occasional wrong-word or sound-a-like substitutions may have occurred due to the inherent limitations of voice recognition software.     Ka Bench, Canary Brim, MD 01/30/21 [phone]

## 2021-01-31 ENCOUNTER — Inpatient Hospital Stay (HOSPITAL_COMMUNITY): Payer: Medicare HMO

## 2021-01-31 ENCOUNTER — Inpatient Hospital Stay: Admission: RE | Admit: 2021-01-31 | Payer: Medicare HMO | Source: Ambulatory Visit

## 2021-01-31 DIAGNOSIS — J9621 Acute and chronic respiratory failure with hypoxia: Secondary | ICD-10-CM

## 2021-01-31 DIAGNOSIS — J9622 Acute and chronic respiratory failure with hypercapnia: Secondary | ICD-10-CM

## 2021-01-31 DIAGNOSIS — I5021 Acute systolic (congestive) heart failure: Secondary | ICD-10-CM

## 2021-01-31 LAB — BLOOD GAS, ARTERIAL
Acid-Base Excess: 13.4 mmol/L — ABNORMAL HIGH (ref 0.0–2.0)
Bicarbonate: 40.5 mmol/L — ABNORMAL HIGH (ref 20.0–28.0)
Drawn by: 236041
FIO2: 35
O2 Saturation: 89.6 %
Patient temperature: 37
pCO2 arterial: 89.5 mmHg (ref 32.0–48.0)
pH, Arterial: 7.278 — ABNORMAL LOW (ref 7.350–7.450)
pO2, Arterial: 63.6 mmHg — ABNORMAL LOW (ref 83.0–108.0)

## 2021-01-31 LAB — ECHOCARDIOGRAM COMPLETE
AR max vel: 3.37 cm2
AV Area VTI: 3.06 cm2
AV Area mean vel: 3.01 cm2
AV Mean grad: 5 mmHg
AV Peak grad: 9.9 mmHg
Ao pk vel: 1.57 m/s
Area-P 1/2: 3.53 cm2
Height: 75 in
S' Lateral: 4.3 cm
Weight: 3527.36 oz

## 2021-01-31 LAB — BASIC METABOLIC PANEL
Anion gap: 6 (ref 5–15)
BUN: 13 mg/dL (ref 8–23)
CO2: 42 mmol/L — ABNORMAL HIGH (ref 22–32)
Calcium: 8.6 mg/dL — ABNORMAL LOW (ref 8.9–10.3)
Chloride: 94 mmol/L — ABNORMAL LOW (ref 98–111)
Creatinine, Ser: 0.97 mg/dL (ref 0.61–1.24)
GFR, Estimated: >60 mL/min (ref >60)
Glucose, Bld: 167 mg/dL — ABNORMAL HIGH (ref 70–99)
Potassium: 4 mmol/L (ref 3.5–5.1)
Sodium: 142 mmol/L (ref 135–145)

## 2021-01-31 MED ORDER — ALBUTEROL SULFATE (2.5 MG/3ML) 0.083% IN NEBU
2.5000 mg | INHALATION_SOLUTION | RESPIRATORY_TRACT | Status: DC
  Administered 2021-01-31: 2.5 mg via RESPIRATORY_TRACT
  Filled 2021-01-31 (×3): qty 3

## 2021-01-31 NOTE — Plan of Care (Signed)
  Problem: Pain Managment: Goal: General experience of comfort will improve Outcome: Progressing   Problem: Safety: Goal: Ability to remain free from injury will improve Outcome: Progressing   Problem: Skin Integrity: Goal: Risk for impaired skin integrity will decrease Outcome: Progressing   

## 2021-01-31 NOTE — Progress Notes (Signed)
[facility] night shift PCU coverage note.  The patient's ABG results showed improvement when compared to yesterday evening.  See results below. Blood gas, arterial [[id number]] (Abnormal)   Collected: 01/31/21 0450   Updated: 01/31/21 0502   Specimen Type: Blood, Arterial    FIO2 35.00   pH, Arterial 7.278 Low    pCO2 arterial 89.5 High Panic   mmHg   pO2, Arterial 63.6 Low  mmHg   Bicarbonate 40.5 High  mmol/L   Acid-Base Excess 13.4 High  mmol/L   O2 Saturation 89.6 %   Patient temperature 37.0   Collection site LEFT BRACHIAL   Drawn by [id number]   Sample type ARTERIAL DRAW   Allens test (pass/fail) PASS    Blood gas, venous [[id number]] (Abnormal)   Collected: 01/30/21 2323   Updated: 01/30/21 2359   Specimen Type: Blood   Specimen Source: Vein    FIO2 48.00   pH, Ven 7.176 Low Panic     pCO2, Ven >120 High Panic   mmHg   pO2, Ven 40.5 mmHg   Bicarbonate 43.4 High  mmol/L   Acid-Base Excess 14.7 High  mmol/L   O2 Saturation 61.7 %   Patient temperature 37.0   Drawn by [id number]   Sample type VENOUS  We will check another arterial blood gas measurement later this morning.  Sanda Klein, MD

## 2021-01-31 NOTE — Progress Notes (Signed)
Heart Failure Navigator Progress Note  Assessed for Heart & Vascular TOC clinic readiness.  Patient does not meet criteria due to admission related to respiratory issues.   Navigator available for reassessment of patient.   Ozella Rocks, MSN, RN Heart Failure Nurse Navigator [phone]

## 2021-01-31 NOTE — Progress Notes (Signed)
  Echocardiogram 2D Echocardiogram has been performed.  Roosvelt Maser F 01/31/2021, 9:14 AM

## 2021-01-31 NOTE — Plan of Care (Signed)
  Problem: Clinical Measurements: Goal: Cardiovascular complication will be avoided Outcome: Progressing   Problem: Pain Managment: Goal: General experience of comfort will improve Outcome: Progressing   

## 2021-01-31 NOTE — Progress Notes (Signed)
Called by RN for orders from the MD to increase the rate on the BiPAP. Made adjustments to IPAP and EPAP as well as increased the RR. Previous settings on BiPAP were 16/8, rate of 8 (patient total rate of 22), with a MVe of 8.7. After adjustments, 20/6, rate 28 (patient total rate of 28) with a MVe of 11.4. Vitals stable at this time. Will continue to monitor.

## 2021-01-31 NOTE — Progress Notes (Signed)
RT removed pt from BiPAP and placed pt on 3LNC. Pt eating at this time.

## 2021-01-31 NOTE — Progress Notes (Signed)
PROGRESS NOTE    Jared [name]  QHU:[id number] DOB: May 17, 1956 DOA: 01/30/2021 PCP: [name], MD    Brief Narrative:  65 year old gentleman with history of coronary artery disease, combined heart failure, history of stroke and GI bleed, paroxysmal A. fib on Xarelto, COPD on 4 L oxygen at home and a smoker presented to cardiologist office and found to be hypoxic 81% on 4 L and wheezing so sent to ER.  Patient stated feeling bad for about 2 weeks.  Short of breath coughing and sneezing.  He smokes 1 pack/day.  In the emergency room afebrile.  Blood pressure is stable.  Oxygen 77 to 99% on 4 L nasal cannula.  BNP 1093.  Troponins normal.  Chest x-ray pulmonary edema.  Given albuterol Lasix DuoNeb magnesium Solu-Medrol and asked to admit.  Ultimately a blood gas and likely showed hypercarbia and needed BiPAP.   Assessment & Plan:   Principal Problem:   Acute on chronic respiratory failure with hypoxia and hypercapnia (HCC) Active Problems:   Essential hypertension   CAD (coronary artery disease)   Cigarette smoker   Prediabetes   COPD with acute exacerbation (HCC)   Acute on chronic combined systolic and diastolic CHF (congestive heart failure) (HCC)   Atrial flutter (HCC)   Iron deficiency anemia   Elevated troponin  Acute on chronic respiratory failure with hypoxia and hypercapnia: Likely secondary to COPD exacerbation. Oxygen and BiPAP to keep saturation more than 90%. Aggressive bronchodilator therapy, IV steroids, inhalational steroids, scheduled and as needed bronchodilators, deep breathing exercises, incentive spirometry, chest physiotherapy and respiratory therapy consult. Antibiotics due to severity of symptoms.  IV cefepime.  Will transition to oral antibiotics when off BiPAP. Supplemental oxygen to keep saturations more than 92%.  Suspected to acute on chronic combined heart failure: With increased lower extremity edema, elevated BNP.  Given 1 dose of Lasix in the ER.   Resume home dose of Lasix.  Repeat echocardiogram today.  Previous echocardiogram with known ejection fraction of 45 to 50% and grade 1 diastolic dysfunction.  Permanent A. fib: Rate controlled.  Not on any rate control medications.  Therapeutic on Xarelto.  Essential hypertension: Stable.   DVT prophylaxis:  rivaroxaban (XARELTO) tablet 20 mg   Code Status: full code  Family Communication: None. Disposition Plan: Status is: Inpatient  Remains inpatient appropriate because:Inpatient level of care appropriate due to severity of illness  Dispo: The patient is from: Home              Anticipated d/c is to: Home              Patient currently is not medically stable to d/c.   Difficult to place patient No         Consultants:  None  Procedures:  None  Antimicrobials:  Cefepime 8/22----   Subjective: Patient seen and examined in the morning rounds.  He was on BiPAP and was hungry.  Denies any symptoms and feels better with BiPAP on. He was able to be weaned off to 3 to 4 L of oxygen in the afternoon.  Objective: Vitals:   01/31/21 0800 01/31/21 0801 01/31/21 1139 01/31/21 1300  BP:    132/77  Pulse: 73 73  87  Resp: (!) 28 (!) 28  20  Temp:    97.8 F (36.6 C)  TempSrc:    Oral  SpO2: 94% 94% 94% 95%  Weight:        Intake/Output Summary (Last 24 hours) at 01/31/2021 1403  Last data filed at 01/30/2021 1501 Gross per 24 hour  Intake 50 ml  Output 625 ml  Net -575 ml   Filed Weights   01/30/21 2240  Weight: 100 kg    Examination:  General exam: Appears mildly anxious.  In mild respiratory distress on BiPAP.  Difficult to speak but able to communicate with BiPAP on. Respiratory system: Poor bilateral air entry.  No added sounds. Cardiovascular system: S1 & S2 heard, RRR.  Gastrointestinal system: Abdomen is nondistended, soft and nontender. No organomegaly or masses felt. Normal bowel sounds heard. Central nervous system: Alert and oriented. No focal  neurological deficits. Extremities: Symmetric 5 x 5 power.     Data Reviewed: I have personally reviewed following labs and imaging studies  CBC: Recent Labs  Lab 01/26/21 0852 01/30/21 1140 01/30/21 1524 01/30/21 1941 01/30/21 2323  WBC 6.5 6.7  --   --  8.9  NEUTROABS 3.8 3.9  --   --   --   HGB 11.9* 11.8* 16.3 16.7 12.0*  HCT 42.5 47.1 48.0 49.0 49.5  MCV 82.7 91.1  --   --  92.9  PLT 281.0 346  --   --  333   Basic Metabolic Panel: Recent Labs  Lab 01/30/21 1140 01/30/21 1524 01/30/21 1941 01/30/21 2134 01/30/21 2323  NA 142 142 143  --  142  K 3.6 3.8 4.2  --  4.0  CL 98  --   --   --  94*  CO2 37*  --   --   --  42*  GLUCOSE 104*  --   --   --  167*  BUN 10  --   --   --  13  CREATININE 0.83  --   --   --  0.97  CALCIUM 8.6*  --   --   --  8.6*  MG  --   --   --  2.1  --    GFR: Estimated Creatinine Clearance: 88.3 mL/min (by C-G formula based on SCr of 0.97 mg/dL). Liver Function Tests: Recent Labs  Lab 01/30/21 1140  AST 34  ALT 32  ALKPHOS 69  BILITOT 0.5  PROT 6.6  ALBUMIN 3.8   No results for input(s): LIPASE, AMYLASE in the last 168 hours. No results for input(s): AMMONIA in the last 168 hours. Coagulation Profile: No results for input(s): INR, PROTIME in the last 168 hours. Cardiac Enzymes: No results for input(s): CKTOTAL, CKMB, CKMBINDEX, TROPONINI in the last 168 hours. BNP (last 3 results) No results for input(s): PROBNP in the last 8760 hours. HbA1C: No results for input(s): HGBA1C in the last 72 hours. CBG: No results for input(s): GLUCAP in the last 168 hours. Lipid Profile: No results for input(s): CHOL, HDL, LDLCALC, TRIG, CHOLHDL, LDLDIRECT in the last 72 hours. Thyroid Function Tests: No results for input(s): TSH, T4TOTAL, FREET4, T3FREE, THYROIDAB in the last 72 hours. Anemia Panel: No results for input(s): VITAMINB12, FOLATE, FERRITIN, TIBC, IRON, RETICCTPCT in the last 72 hours. Sepsis Labs: Recent Labs  Lab  01/30/21 1140  LATICACIDVEN 1.2    Recent Results (from the past 240 hour(s))  Resp Panel by RT-PCR (Flu A&B, Covid) Nasopharyngeal Swab     Status: None   Collection Time: 01/30/21 11:45 AM   Specimen: Nasopharyngeal Swab; Nasopharyngeal(NP) swabs in vial transport medium  Result Value Ref Range Status   SARS Coronavirus 2 by RT PCR NEGATIVE NEGATIVE Final    Comment: (NOTE) SARS-CoV-2 target nucleic acids are  NOT DETECTED.  The SARS-CoV-2 RNA is generally detectable in upper respiratory specimens during the acute phase of infection. The lowest concentration of SARS-CoV-2 viral copies this assay can detect is 138 copies/mL. A negative result does not preclude SARS-Cov-2 infection and should not be used as the sole basis for treatment or other patient management decisions. A negative result may occur with  improper specimen collection/handling, submission of specimen other than nasopharyngeal swab, presence of viral mutation(s) within the areas targeted by this assay, and inadequate number of viral copies(<138 copies/mL). A negative result must be combined with clinical observations, patient history, and epidemiological information. The expected result is Negative.  Fact Sheet for Patients:  BloggerCourse.com  Fact Sheet for Healthcare Providers:  SeriousBroker.it  This test is no t yet approved or cleared by the [place] FDA and  has been authorized for detection and/or diagnosis of SARS-CoV-2 by FDA under an Emergency Use Authorization (EUA). This EUA will remain  in effect (meaning this test can be used) for the duration of the COVID-19 declaration under Section 564(b)(1) of the Act, 21 U.S.C.section 360bbb-3(b)(1), unless the authorization is terminated  or revoked sooner.       Influenza A by PCR NEGATIVE NEGATIVE Final   Influenza B by PCR NEGATIVE NEGATIVE Final    Comment: (NOTE) The Xpert Xpress  SARS-CoV-2/FLU/RSV plus assay is intended as an aid in the diagnosis of influenza from Nasopharyngeal swab specimens and should not be used as a sole basis for treatment. Nasal washings and aspirates are unacceptable for Xpert Xpress SARS-CoV-2/FLU/RSV testing.  Fact Sheet for Patients: BloggerCourse.com  Fact Sheet for Healthcare Providers: SeriousBroker.it  This test is not yet approved or cleared by the [place] FDA and has been authorized for detection and/or diagnosis of SARS-CoV-2 by FDA under an Emergency Use Authorization (EUA). This EUA will remain in effect (meaning this test can be used) for the duration of the COVID-19 declaration under Section 564(b)(1) of the Act, 21 U.S.C. section 360bbb-3(b)(1), unless the authorization is terminated or revoked.  Performed at [facility], [address] [place], [place] [address]          Radiology Studies: DG Chest Portable 1 View  Result Date: 01/30/2021 CLINICAL DATA:  Hypoxia EXAM: PORTABLE CHEST 1 VIEW COMPARISON:  11/18/2020 FINDINGS: Cardiomegaly, which is stable to slightly increased in size. Aortic atherosclerosis. No focal pulmonary opacity. No significant pleural fluid. Redemonstrated healed left rib fractures. No acute osseous abnormality. IMPRESSION: 1. Stable to slightly increased cardiomegaly without evidence of pulmonary edema. 2.  Aortic Atherosclerosis (ICD10-I70.0). Electronically Signed   By: Wiliam Ke M.D.   On: 01/30/2021 12:28   ECHOCARDIOGRAM COMPLETE  Result Date: 01/31/2021    ECHOCARDIOGRAM REPORT   Patient Name:   Jared [name] Date of Exam: 01/31/2021 Medical Rec #:  [id number]    Height:       75.0 in Accession #:    [id number]   Weight:       220.5 lb Date of Birth:  1956-05-20     BSA:          2.287 m Patient Age:    65 years     BP:           119/82 mmHg Patient Gender: M            HR:           94 bpm. Exam Location:  Inpatient  Procedure: Cardiac Doppler, Color Doppler, 2D Echo and 3D  Echo Indications:    CHF-Acute Systolic I50.21  History:        Patient has prior history of Echocardiogram examinations.                 Respiratory Failure on Bipap.  Sonographer:    Roosvelt Maser RDCS Referring Phys: [id number] ALLISON WOLFE IMPRESSIONS  1. Left ventricular ejection fraction, by estimation, is 45 to 50%. Left ventricular ejection fraction by 3D volume is 48 %. The left ventricle has mildly decreased function. The left ventricle demonstrates global hypokinesis. There is moderate left ventricular hypertrophy. Left ventricular diastolic parameters are consistent with Grade II diastolic dysfunction (pseudonormalization).  2. Right ventricular systolic function is moderately reduced. The right ventricular size is moderately enlarged. There is moderately elevated pulmonary artery systolic pressure while on BiPAP.  3. Left atrial size was moderately dilated.  4. Right atrial size was moderately dilated.  5. The mitral valve is normal in structure. Trivial mitral valve regurgitation. No evidence of mitral stenosis.  6. The aortic valve is tricuspid. There is mild calcification of the aortic valve. Aortic valve regurgitation is trivial. Mild aortic valve sclerosis is present, with no evidence of aortic valve stenosis. FINDINGS  Left Ventricle: Left ventricular ejection fraction, by estimation, is 45 to 50%. Left ventricular ejection fraction by 3D volume is 48 %. The left ventricle has mildly decreased function. The left ventricle demonstrates global hypokinesis. The left ventricular internal cavity size was normal in size. There is moderate left ventricular hypertrophy. Left ventricular diastolic parameters are consistent with Grade II diastolic dysfunction (pseudonormalization). Right Ventricle: The right ventricular size is moderately enlarged. No increase in right ventricular wall thickness. Right ventricular systolic function is moderately reduced.  There is moderately elevated pulmonary artery systolic pressure. The tricuspid  regurgitant velocity is 3.14 m/s, and with an assumed right atrial pressure of 15 mmHg, the estimated right ventricular systolic pressure is 54.4 mmHg. Left Atrium: Left atrial size was moderately dilated. Right Atrium: Right atrial size was moderately dilated. Pericardium: Trivial pericardial effusion is present. Presence of pericardial fat pad. Mitral Valve: The mitral valve is normal in structure. Trivial mitral valve regurgitation. No evidence of mitral valve stenosis. Tricuspid Valve: The tricuspid valve is normal in structure. Tricuspid valve regurgitation is mild . No evidence of tricuspid stenosis. Aortic Valve: The aortic valve is tricuspid. There is mild calcification of the aortic valve. Aortic valve regurgitation is trivial. Mild aortic valve sclerosis is present, with no evidence of aortic valve stenosis. Aortic valve mean gradient measures 5.0 mmHg. Aortic valve peak gradient measures 9.9 mmHg. Aortic valve area, by VTI measures 3.06 cm. Pulmonic Valve: The pulmonic valve was normal in structure. Pulmonic valve regurgitation is not visualized. No evidence of pulmonic stenosis. Aorta: The aortic root is normal in size and structure. Venous: IVC assessment for right atrial pressure unable to be performed due to mechanical ventilation. IAS/Shunts: No atrial level shunt detected by color flow Doppler.  LEFT VENTRICLE PLAX 2D LVIDd:         5.70 cm         Diastology LVIDs:         4.30 cm         LV e' medial:    5.98 cm/s LV PW:         1.50 cm         LV E/e' medial:  17.4 LV IVS:        1.30 cm  LV e' lateral:   8.16 cm/s LVOT diam:     2.20 cm         LV E/e' lateral: 12.7 LV SV:         96 LV SV Index:   42 LVOT Area:     3.80 cm        3D Volume EF                                LV 3D EF:    Left                                             ventricul                                             ar                                              ejection                                             fraction                                             by 3D                                             volume is                                             48 %.                                 3D Volume EF:                                3D EF:        48 %                                LV EDV:       148 ml                                LV ESV:       77 ml  LV SV:        71 ml RIGHT VENTRICLE            IVC RV Basal diam:  4.50 cm    IVC diam: 2.70 cm RV Mid diam:    3.80 cm RV S prime:     8.40 cm/s TAPSE (M-mode): 2.0 cm LEFT ATRIUM              Index       RIGHT ATRIUM           Index LA diam:        3.60 cm  1.57 cm/m  RA Area:     26.60 cm LA Vol (A2C):   92.8 ml  40.57 ml/m RA Volume:   91.40 ml  39.96 ml/m LA Vol (A4C):   100.0 ml 43.72 ml/m LA Biplane Vol: 104.0 ml 45.47 ml/m  AORTIC VALVE AV Area (Vmax):    3.37 cm AV Area (Vmean):   3.01 cm AV Area (VTI):     3.06 cm AV Vmax:           157.00 cm/s AV Vmean:          106.000 cm/s AV VTI:            0.313 m AV Peak Grad:      9.9 mmHg AV Mean Grad:      5.0 mmHg LVOT Vmax:         139.00 cm/s LVOT Vmean:        84.000 cm/s LVOT VTI:          0.252 m LVOT/AV VTI ratio: 0.81  AORTA Ao Root diam: 3.40 cm MITRAL VALVE                TRICUSPID VALVE MV Area (PHT): 3.53 cm     TR Peak grad:   39.4 mmHg MV Decel Time: 215 msec     TR Vmax:        314.00 cm/s MV E velocity: 104.00 cm/s MV A velocity: 83.00 cm/s   SHUNTS MV E/A ratio:  1.25         Systemic VTI:  0.25 m                             Systemic Diam: 2.20 cm Weston Brass MD Electronically signed by Weston Brass MD Signature Date/Time: 01/31/2021/10:56:06 AM    Final         Scheduled Meds:  albuterol  2.5 mg Nebulization Q4H   atorvastatin  80 mg Oral Daily   ferrous sulfate  325 mg Oral BID WC   fluticasone  1 spray Each Nare Daily   fluticasone furoate-vilanterol  1  puff Inhalation Daily   furosemide  40 mg Intravenous Daily   gabapentin  300 mg Oral QHS   methylPREDNISolone (SOLU-MEDROL) injection  120 mg Intravenous Q24H   multivitamin with minerals  1 tablet Oral Daily   nicotine  21 mg Transdermal Daily   rivaroxaban  20 mg Oral Q supper   sodium chloride flush  3 mL Intravenous Q12H   umeclidinium bromide  1 puff Inhalation Daily   Continuous Infusions:  sodium chloride     ceFEPime (MAXIPIME) IV 2 g (01/31/21 1023)     LOS: 1 day    Time spent: 30 minutes    Dorcas Carrow, MD [facility] Pager [phone]

## 2021-02-01 DIAGNOSIS — J9621 Acute and chronic respiratory failure with hypoxia: Secondary | ICD-10-CM | POA: Diagnosis not present

## 2021-02-01 DIAGNOSIS — J9622 Acute and chronic respiratory failure with hypercapnia: Secondary | ICD-10-CM | POA: Diagnosis not present

## 2021-02-01 MED ORDER — IBUPROFEN 200 MG PO TABS
400.0000 mg | ORAL_TABLET | Freq: Four times a day (QID) | ORAL | Status: DC | PRN
  Administered 2021-02-01: 400 mg via ORAL
  Filled 2021-02-01 (×2): qty 2

## 2021-02-01 NOTE — Progress Notes (Signed)
Pt removed from BIPAP and placed on 3L [place]. Pt is tolerating well at this time.  

## 2021-02-01 NOTE — Evaluation (Signed)
Physical Therapy Evaluation Patient Details Name: Jared [name] MRN: [id number] DOB: 1955/07/09 Today's Date: 02/01/2021   History of Present Illness  Pt is a 65 y.o.M who presents with acute on chronic respiratory failure with hypoxia and hypercapnia. Significant PMH: CAD, combined heart failure, stroke, GIB, paroxysmal F. aib, COPD on 4L O2, smoker.  Clinical Impression  PTA, pt lives in a boarding house and is independent. Pt presents with decreased cardiopulmonary endurance, but is overall moving well. Pt ambulating 200 feet with a walker at a supervision level. Desaturation to 86% on 3L O2 sitting edge of bed, so bumped up to 4L O2 for walk. Pt overall maintaining > 90%, but towards end of ambulation bout, decreased to 80%. Rebounded with seated rest break after ~2 minutes. Recommend RW and tub bench for energy conservation. Will continue to follow acutely to promote mobility as tolerated.     Follow Up Recommendations No PT follow up    Equipment Recommendations  Rolling walker with 5" wheels (tub/shower bench)    Recommendations for Other Services       Precautions / Restrictions Precautions Precautions: Other (comment) Precaution Comments: watch O2 Restrictions Weight Bearing Restrictions: No      Mobility  Bed Mobility Overal bed mobility: Modified Independent                  Transfers Overall transfer level: Modified independent Equipment used: Rolling walker (2 wheeled);None                Ambulation/Gait Ambulation/Gait assistance: Supervision Gait Distance (Feet): 200 Feet Assistive device: Rolling walker (2 wheeled) Gait Pattern/deviations: Step-through pattern;Decreased stride length Gait velocity: decreased   General Gait Details: slow and steady pace, cues for activity pacing  Stairs            Wheelchair Mobility    Modified Rankin (Stroke Patients Only)       Balance Overall balance assessment: Mild deficits observed, not  formally tested                                           Pertinent Vitals/Pain Pain Assessment: No/denies pain    Home Living Family/patient expects to be discharged to:: Private residence Living Arrangements: Alone (lives in boarding house)   Type of Home: House Home Access: Stairs to enter   Secretary/administrator of Steps: 3 Home Layout: One level Home Equipment: None      Prior Function Level of Independence: Independent               Hand Dominance   Dominant Hand: Right    Extremity/Trunk Assessment   Upper Extremity Assessment Upper Extremity Assessment: Overall WFL for tasks assessed    Lower Extremity Assessment Lower Extremity Assessment: Overall WFL for tasks assessed       Communication   Communication: No difficulties  Cognition Arousal/Alertness: Awake/alert Behavior During Therapy: WFL for tasks assessed/performed Overall Cognitive Status: Within Functional Limits for tasks assessed                                        General Comments      Exercises     Assessment/Plan    PT Assessment Patient needs continued PT services  PT Problem List Decreased strength;Decreased activity tolerance;Decreased balance;Decreased mobility;Cardiopulmonary status limiting  activity       PT Treatment Interventions DME instruction;Gait training;Functional mobility training;Stair training;Therapeutic activities;Therapeutic exercise;Balance training;Patient/family education    PT Goals (Current goals can be found in the Care Plan section)  Acute Rehab PT Goals Patient Stated Goal: improved O2 PT Goal Formulation: With patient Time For Goal Achievement: 02/15/21 Potential to Achieve Goals: Good    Frequency Min 3X/week   Barriers to discharge        Co-evaluation               AM-PAC PT "6 Clicks" Mobility  Outcome Measure Help needed turning from your back to your side while in a flat bed without  using bedrails?: None Help needed moving from lying on your back to sitting on the side of a flat bed without using bedrails?: None Help needed moving to and from a bed to a chair (including a wheelchair)?: A Little Help needed standing up from a chair using your arms (e.g., wheelchair or bedside chair)?: A Little Help needed to walk in hospital room?: A Little Help needed climbing 3-5 steps with a railing? : A Little 6 Click Score: 20    End of Session Equipment Utilized During Treatment: Oxygen Activity Tolerance: Patient tolerated treatment well Patient left: in chair;with call bell/phone within reach Nurse Communication: Mobility status PT Visit Diagnosis: Difficulty in walking, not elsewhere classified (R26.2)    Time: 0102-7253 PT Time Calculation (min) (ACUTE ONLY): 24 min   Charges:   PT Evaluation $PT Eval Moderate Complexity: 1 Mod PT Treatments $Therapeutic Activity: 8-22 mins        Lillia Pauls, PT, DPT Acute Rehabilitation Services Pager [phone] Office [phone]   Norval Morton 02/01/2021, 5:00 PM

## 2021-02-01 NOTE — TOC Initial Note (Signed)
Transition of Care [facility]) - Initial/Assessment Note    Patient Details  Name: Jared [name] MRN: [id number] Date of Birth: [birth year]  Transition of Care [facility]) CM/SW Contact:    Beckie Busing, RN Phone Number:[phone]  02/01/2021, 3:33 PM  Clinical Narrative:                 [facility] consulted for patient with high risk for readmission. Patient is from home where he lives alone and functions independently at a boarding house. Patient reports that he rents room but functions independently. Patient has PCP Dr. Tito Dine at [facility] and [employer] [facility].  Patient reports that he follows up with his PCP and has transportation and access to medications. At  Baseline patient has home  O2 at 3-4L. Patient reports that MD told him he would need CPAP at home. CM followed up with MD who confirms that patient will need a trilogy. Patient offered choice for DME. Trilogy has been set up with [employer]. RT to deliver trilogy to bedside today. Rolling walker and shower bench to be delivered also. No other needs noted at this time. TOC will continue to follow.  Expected Discharge Plan: Home/Self Care Barriers to Discharge: Continued Medical Work up   Patient Goals and CMS Choice Patient states their goals for this hospitalization and ongoing recovery are:: Ready to get better to go home CMS Medicare.gov Compare Post Acute Care list provided to:: Patient Choice offered to / list presented to : Patient  Expected Discharge Plan and Services Expected Discharge Plan: Home/Self Care In-house Referral: NA Discharge Planning Services: CM Consult Post Acute Care Choice: Durable Medical Equipment Living arrangements for the past 2 months: Boarding House                 DME Arranged: Shower stool, Walker rolling, Other see comment (trilogy) DME Agency: [employer] Date DME Agency Contacted: 02/01/21 Time DME Agency Contacted: 1530 Representative spoke with at DME Agency: Vaughan Basta             Prior Living Arrangements/Services Living arrangements for the past 2 months: [employer] Lives with:: Self Patient language and need for interpreter reviewed:: Yes Do you feel safe going back to the place where you live?: Yes      Need for Family Participation in Patient Care: Yes (Comment) Care giver support system in place?: Yes (comment) Current home services: DME Criminal Activity/Legal Involvement Pertinent to Current Situation/Hospitalization: No - Comment as needed  Activities of Daily Living      Permission Sought/Granted   Permission granted to share information with : No              Emotional Assessment Appearance:: Appears stated age Attitude/Demeanor/Rapport: Gracious Affect (typically observed): Pleasant Orientation: : Oriented to Self, Oriented to Place, Oriented to  Time, Oriented to Situation Alcohol / Substance Use: Not Applicable Psych Involvement: No (comment)  Admission diagnosis:  Hypoxia [R09.02] Acute on chronic respiratory failure with hypoxia (HCC) [J96.21] Patient Active Problem List   Diagnosis Date Noted   Elevated troponin 01/30/2021   History of GI bleed    Gastric ulcer due to Helicobacter pylori and nonsteroidal anti-inflammatory drug (NSAID)    AKI (acute kidney injury) (HCC) 11/18/2020   Epigastric pain    Acute gastric ulcer with hemorrhage    Acute blood loss anemia    Acute upper GI bleed 11/12/2020   Hematemesis with nausea    Acute GI bleeding 06/11/2019   Syncope 06/11/2019   Chronic right  maxillary sinusitis 01/29/2019   Chronic respiratory failure with hypoxia and hypercapnia (HCC) 01/01/2019   Acute on chronic respiratory failure with hypoxia and hypercapnia (HCC) 11/10/2018   GI bleed 10/27/2018   Chronic anticoagulation 09/27/2018   AV malformation of gastrointestinal tract    Occult GI bleeding    Heme positive stool    Iron deficiency anemia    Acute on chronic anemia 09/04/2018   Noncompliance 06/14/2017    Elevated LFTs 08/13/2016   Chronic respiratory failure with hypoxia (HCC) 07/24/2016   Atrial flutter (HCC)    Hepatic congestion 07/02/2016   Acute on chronic combined systolic and diastolic CHF (congestive heart failure) (HCC) 07/01/2016   COPD with acute exacerbation (HCC) 06/03/2016   Prediabetes 05/14/2016   Hemoptysis 05/06/2016   COPD GOLD III/still smoking  02/29/2016   Cigarette smoker 01/09/2016   CAD (coronary artery disease) 10/07/2013   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 [place] [address] Phone: [phone] Fax: [phone]  [facility] (Now [facility]) - [place], [place] - [address] [place] [address] Phone: [phone] Fax: [phone]     Social Determinants of Health (SDOH) Interventions    Readmission Risk Interventions Readmission Risk Prevention Plan 02/01/2021 11/11/2018  Transportation Screening Complete -  PCP or Specialist Appt within 5-7 Days Complete -  Home Care Screening Complete -  Medication Review (RN CM) Complete -  Medication Review (RN Care Manager) - Complete  PCP or Specialist appointment within 3-5 days of discharge - Complete  HRI or Home Care Consult - Complete  SW Recovery Care/Counseling Consult - Complete  Palliative Care Screening - Not Applicable  Skilled Nursing Facility - Not Applicable  Some recent data might be hidden

## 2021-02-01 NOTE — Progress Notes (Signed)
PROGRESS NOTE    Jared [name]  HYW:[id number] DOB: [birth year] DOA: 01/30/2021 PCP: [name], MD    Brief Narrative:  65 year old gentleman with history of coronary artery disease, combined heart failure, history of stroke and GI bleed, paroxysmal A. fib on Xarelto, COPD on 4 L oxygen at home and smoker presented to cardiologist office and found to be hypoxic 81% on 4 L and wheezing so sent to ER.  Patient stated feeling bad for about 2 weeks.  Short of breath coughing and sneezing.  He smokes 1 pack/day.  In the emergency room afebrile.  Blood pressure is stable.  Oxygen 77 to 99% on 4 L nasal cannula.  BNP 1093.  Troponins normal.  Chest x-ray pulmonary edema.  Given albuterol Lasix DuoNeb magnesium Solu-Medrol and asked to admit.  Ultimately a blood gas and likely showed hypercarbia and needed BiPAP.   Assessment & Plan:   Principal Problem:   Acute on chronic respiratory failure with hypoxia and hypercapnia (HCC) Active Problems:   Essential hypertension   CAD (coronary artery disease)   Cigarette smoker   Prediabetes   COPD with acute exacerbation (HCC)   Acute on chronic combined systolic and diastolic CHF (congestive heart failure) (HCC)   Atrial flutter (HCC)   Iron deficiency anemia   Elevated troponin  Acute on chronic respiratory failure with hypoxia and hypercapnia: Likely secondary to COPD exacerbation.  Patient with severe hypercapnia on presentation.  Responded well to BiPAP. Oxygen and BiPAP to keep saturation more than 90%. Aggressive bronchodilator therapy, IV steroids, inhalational steroids, scheduled and as needed bronchodilators, deep breathing exercises, incentive spirometry, chest physiotherapy and respiratory therapy consult. Antibiotics due to severity of symptoms.  IV cefepime.  Will transition to oral antibiotics on discharge.   Supplemental oxygen to keep saturations more than 90%.  Uses 4 L oxygen at home. Due to significant hypercapnia on  presentation, he does qualify for noninvasive ventilator at home that will prevent exacerbations, complications and readmissions to the hospital.  Suspected to acute on chronic combined heart failure: With increased lower extremity edema, elevated BNP.  On Lasix.  2D echocardiogram with ejection fraction 45 to 50%.  Comparable to previous echocardiogram.  Continue IV Lasix today.  We will recheck electrolytes tomorrow.  Permanent A. fib: Rate controlled.  Not on any rate control medications.  Therapeutic on Xarelto.  Essential hypertension: Stable.   DVT prophylaxis:  rivaroxaban (XARELTO) tablet 20 mg   Code Status: full code  Family Communication: None.  We will call his sister. Disposition Plan: Status is: Inpatient  Remains inpatient appropriate because:Inpatient level of care appropriate due to severity of illness  Dispo: The patient is from: Home              Anticipated d/c is to: Home/boarding home.              Patient currently is not medically stable to d/c.   Difficult to place patient No         Consultants:  None  Procedures:  None  Antimicrobials:  Cefepime 8/22----   Subjective: Patient seen and examined.  He put on BiPAP all night.  He feels weak but better than how he came here with.  Currently on 3 L oxygen.  Objective: Vitals:   02/01/21 0342 02/01/21 0421 02/01/21 0756 02/01/21 0810  BP:  (!) 161/82  (!) 148/82  Pulse: 75 66  73  Resp: 16 14    Temp:  98.3 F (36.8 C)  98.6  F (37 C)  TempSrc:  Axillary  Oral  SpO2: 98% 97% 96%   Weight:        Intake/Output Summary (Last 24 hours) at 02/01/2021 1259 Last data filed at 02/01/2021 1230 Gross per 24 hour  Intake 1026.72 ml  Output 1600 ml  Net -573.28 ml   Filed Weights   01/30/21 2240  Weight: 100 kg    Examination:  General exam: Chronically sick looking gentleman.  Frail and debilitated.  Not in any distress. Respiratory system: Poor bilateral air entry.  No added  sounds. Cardiovascular system: S1 & S2 heard, RRR.  Gastrointestinal system: Abdomen is nondistended, soft and nontender. No organomegaly or masses felt. Normal bowel sounds heard. Central nervous system: Alert and oriented. No focal neurological deficits. Extremities: Symmetric 5 x 5 power.     Data Reviewed: I have personally reviewed following labs and imaging studies  CBC: Recent Labs  Lab 01/26/21 0852 01/30/21 1140 01/30/21 1524 01/30/21 1941 01/30/21 2323  WBC 6.5 6.7  --   --  8.9  NEUTROABS 3.8 3.9  --   --   --   HGB 11.9* 11.8* 16.3 16.7 12.0*  HCT 42.5 47.1 48.0 49.0 49.5  MCV 82.7 91.1  --   --  92.9  PLT 281.0 346  --   --  333   Basic Metabolic Panel: Recent Labs  Lab 01/30/21 1140 01/30/21 1524 01/30/21 1941 01/30/21 2134 01/30/21 2323  NA 142 142 143  --  142  K 3.6 3.8 4.2  --  4.0  CL 98  --   --   --  94*  CO2 37*  --   --   --  42*  GLUCOSE 104*  --   --   --  167*  BUN 10  --   --   --  13  CREATININE 0.83  --   --   --  0.97  CALCIUM 8.6*  --   --   --  8.6*  MG  --   --   --  2.1  --    GFR: Estimated Creatinine Clearance: 88.3 mL/min (by C-G formula based on SCr of 0.97 mg/dL). Liver Function Tests: Recent Labs  Lab 01/30/21 1140  AST 34  ALT 32  ALKPHOS 69  BILITOT 0.5  PROT 6.6  ALBUMIN 3.8   No results for input(s): LIPASE, AMYLASE in the last 168 hours. No results for input(s): AMMONIA in the last 168 hours. Coagulation Profile: No results for input(s): INR, PROTIME in the last 168 hours. Cardiac Enzymes: No results for input(s): CKTOTAL, CKMB, CKMBINDEX, TROPONINI in the last 168 hours. BNP (last 3 results) No results for input(s): PROBNP in the last 8760 hours. HbA1C: No results for input(s): HGBA1C in the last 72 hours. CBG: No results for input(s): GLUCAP in the last 168 hours. Lipid Profile: No results for input(s): CHOL, HDL, LDLCALC, TRIG, CHOLHDL, LDLDIRECT in the last 72 hours. Thyroid Function Tests: No  results for input(s): TSH, T4TOTAL, FREET4, T3FREE, THYROIDAB in the last 72 hours. Anemia Panel: No results for input(s): VITAMINB12, FOLATE, FERRITIN, TIBC, IRON, RETICCTPCT in the last 72 hours. Sepsis Labs: Recent Labs  Lab 01/30/21 1140  LATICACIDVEN 1.2    Recent Results (from the past 240 hour(s))  Resp Panel by RT-PCR (Flu A&B, Covid) Nasopharyngeal Swab     Status: None   Collection Time: 01/30/21 11:45 AM   Specimen: Nasopharyngeal Swab; Nasopharyngeal(NP) swabs in vial transport medium  Result Value  Ref Range Status   SARS Coronavirus 2 by RT PCR NEGATIVE NEGATIVE Final    Comment: (NOTE) SARS-CoV-2 target nucleic acids are NOT DETECTED.  The SARS-CoV-2 RNA is generally detectable in upper respiratory specimens during the acute phase of infection. The lowest concentration of SARS-CoV-2 viral copies this assay can detect is 138 copies/mL. A negative result does not preclude SARS-Cov-2 infection and should not be used as the sole basis for treatment or other patient management decisions. A negative result may occur with  improper specimen collection/handling, submission of specimen other than nasopharyngeal swab, presence of viral mutation(s) within the areas targeted by this assay, and inadequate number of viral copies(<138 copies/mL). A negative result must be combined with clinical observations, patient history, and epidemiological information. The expected result is Negative.  Fact Sheet for Patients:  BloggerCourse.comhttps://www.fda.gov/media/152166/download  Fact Sheet for Healthcare Providers:  SeriousBroker.ithttps://www.fda.gov/media/152162/download  This test is no t yet approved or cleared by the [place] FDA and  has been authorized for detection and/or diagnosis of SARS-CoV-2 by FDA under an Emergency Use Authorization (EUA). This EUA will remain  in effect (meaning this test can be used) for the duration of the COVID-19 declaration under Section 564(b)(1) of the Act,  21 U.S.C.section 360bbb-3(b)(1), unless the authorization is terminated  or revoked sooner.       Influenza A by PCR NEGATIVE NEGATIVE Final   Influenza B by PCR NEGATIVE NEGATIVE Final    Comment: (NOTE) The Xpert Xpress SARS-CoV-2/FLU/RSV plus assay is intended as an aid in the diagnosis of influenza from Nasopharyngeal swab specimens and should not be used as a sole basis for treatment. Nasal washings and aspirates are unacceptable for Xpert Xpress SARS-CoV-2/FLU/RSV testing.  Fact Sheet for Patients: BloggerCourse.comhttps://www.fda.gov/media/152166/download  Fact Sheet for Healthcare Providers: SeriousBroker.ithttps://www.fda.gov/media/152162/download  This test is not yet approved or cleared by the [place] FDA and has been authorized for detection and/or diagnosis of SARS-CoV-2 by FDA under an Emergency Use Authorization (EUA). This EUA will remain in effect (meaning this test can be used) for the duration of the COVID-19 declaration under Section 564(b)(1) of the Act, 21 U.S.C. section 360bbb-3(b)(1), unless the authorization is terminated or revoked.  Performed at [facility], [address] [place], [place] [address]          Radiology Studies: ECHOCARDIOGRAM COMPLETE  Result Date: 01/31/2021    ECHOCARDIOGRAM REPORT   Patient Name:   Haynes DageJOHNNY Mcsweeney Date of Exam: 01/31/2021 Medical Rec #:  [id number]    Height:       75.0 in Accession #:    [id number]   Weight:       220.5 lb Date of Birth:  01/28/1956     BSA:          2.287 m Patient Age:    65 years     BP:           119/82 mmHg Patient Gender: M            HR:           94 bpm. Exam Location:  Inpatient Procedure: Cardiac Doppler, Color Doppler, 2D Echo and 3D Echo Indications:    CHF-Acute Systolic I50.21  History:        Patient has prior history of Echocardiogram examinations.                 Respiratory Failure on Bipap.  Sonographer:    Roosvelt Maserachel Lane RDCS Referring Phys: [id number] ALLISON WOLFE IMPRESSIONS  1.  Left ventricular ejection  fraction, by estimation, is 45 to 50%. Left ventricular ejection fraction by 3D volume is 48 %. The left ventricle has mildly decreased function. The left ventricle demonstrates global hypokinesis. There is moderate left ventricular hypertrophy. Left ventricular diastolic parameters are consistent with Grade II diastolic dysfunction (pseudonormalization).  2. Right ventricular systolic function is moderately reduced. The right ventricular size is moderately enlarged. There is moderately elevated pulmonary artery systolic pressure while on BiPAP.  3. Left atrial size was moderately dilated.  4. Right atrial size was moderately dilated.  5. The mitral valve is normal in structure. Trivial mitral valve regurgitation. No evidence of mitral stenosis.  6. The aortic valve is tricuspid. There is mild calcification of the aortic valve. Aortic valve regurgitation is trivial. Mild aortic valve sclerosis is present, with no evidence of aortic valve stenosis. FINDINGS  Left Ventricle: Left ventricular ejection fraction, by estimation, is 45 to 50%. Left ventricular ejection fraction by 3D volume is 48 %. The left ventricle has mildly decreased function. The left ventricle demonstrates global hypokinesis. The left ventricular internal cavity size was normal in size. There is moderate left ventricular hypertrophy. Left ventricular diastolic parameters are consistent with Grade II diastolic dysfunction (pseudonormalization). Right Ventricle: The right ventricular size is moderately enlarged. No increase in right ventricular wall thickness. Right ventricular systolic function is moderately reduced. There is moderately elevated pulmonary artery systolic pressure. The tricuspid  regurgitant velocity is 3.14 m/s, and with an assumed right atrial pressure of 15 mmHg, the estimated right ventricular systolic pressure is 54.4 mmHg. Left Atrium: Left atrial size was moderately dilated. Right Atrium: Right atrial size was moderately  dilated. Pericardium: Trivial pericardial effusion is present. Presence of pericardial fat pad. Mitral Valve: The mitral valve is normal in structure. Trivial mitral valve regurgitation. No evidence of mitral valve stenosis. Tricuspid Valve: The tricuspid valve is normal in structure. Tricuspid valve regurgitation is mild . No evidence of tricuspid stenosis. Aortic Valve: The aortic valve is tricuspid. There is mild calcification of the aortic valve. Aortic valve regurgitation is trivial. Mild aortic valve sclerosis is present, with no evidence of aortic valve stenosis. Aortic valve mean gradient measures 5.0 mmHg. Aortic valve peak gradient measures 9.9 mmHg. Aortic valve area, by VTI measures 3.06 cm. Pulmonic Valve: The pulmonic valve was normal in structure. Pulmonic valve regurgitation is not visualized. No evidence of pulmonic stenosis. Aorta: The aortic root is normal in size and structure. Venous: IVC assessment for right atrial pressure unable to be performed due to mechanical ventilation. IAS/Shunts: No atrial level shunt detected by color flow Doppler.  LEFT VENTRICLE PLAX 2D LVIDd:         5.70 cm         Diastology LVIDs:         4.30 cm         LV e' medial:    5.98 cm/s LV PW:         1.50 cm         LV E/e' medial:  17.4 LV IVS:        1.30 cm         LV e' lateral:   8.16 cm/s LVOT diam:     2.20 cm         LV E/e' lateral: 12.7 LV SV:         96 LV SV Index:   42 LVOT Area:     3.80 cm  3D Volume EF                                LV 3D EF:    Left                                             ventricul                                             ar                                             ejection                                             fraction                                             by 3D                                             volume is                                             48 %.                                 3D Volume EF:                                3D EF:         48 %                                LV EDV:       148 ml                                LV ESV:       77 ml                                LV SV:        71 ml RIGHT VENTRICLE            IVC RV Basal diam:  4.50 cm    IVC diam: 2.70 cm RV Mid diam:    3.80 cm RV S prime:  8.40 cm/s TAPSE (M-mode): 2.0 cm LEFT ATRIUM              Index       RIGHT ATRIUM           Index LA diam:        3.60 cm  1.57 cm/m  RA Area:     26.60 cm LA Vol (A2C):   92.8 ml  40.57 ml/m RA Volume:   91.40 ml  39.96 ml/m LA Vol (A4C):   100.0 ml 43.72 ml/m LA Biplane Vol: 104.0 ml 45.47 ml/m  AORTIC VALVE AV Area (Vmax):    3.37 cm AV Area (Vmean):   3.01 cm AV Area (VTI):     3.06 cm AV Vmax:           157.00 cm/s AV Vmean:          106.000 cm/s AV VTI:            0.313 m AV Peak Grad:      9.9 mmHg AV Mean Grad:      5.0 mmHg LVOT Vmax:         139.00 cm/s LVOT Vmean:        84.000 cm/s LVOT VTI:          0.252 m LVOT/AV VTI ratio: 0.81  AORTA Ao Root diam: 3.40 cm MITRAL VALVE                TRICUSPID VALVE MV Area (PHT): 3.53 cm     TR Peak grad:   39.4 mmHg MV Decel Time: 215 msec     TR Vmax:        314.00 cm/s MV E velocity: 104.00 cm/s MV A velocity: 83.00 cm/s   SHUNTS MV E/A ratio:  1.25         Systemic VTI:  0.25 m                             Systemic Diam: 2.20 cm Weston Brass MD Electronically signed by Weston Brass MD Signature Date/Time: 01/31/2021/10:56:06 AM    Final         Scheduled Meds:  atorvastatin  80 mg Oral Daily   ferrous sulfate  325 mg Oral BID WC   fluticasone  1 spray Each Nare Daily   fluticasone furoate-vilanterol  1 puff Inhalation Daily   furosemide  40 mg Intravenous Daily   gabapentin  300 mg Oral QHS   methylPREDNISolone (SOLU-MEDROL) injection  120 mg Intravenous Q24H   multivitamin with minerals  1 tablet Oral Daily   nicotine  21 mg Transdermal Daily   rivaroxaban  20 mg Oral Q supper   sodium chloride flush  3 mL Intravenous Q12H   umeclidinium bromide  1 puff  Inhalation Daily   Continuous Infusions:  sodium chloride     ceFEPime (MAXIPIME) IV 2 g (02/01/21 0923)     LOS: 2 days    Time spent: 30 minutes    Dorcas Carrow, MD [facility] Pager [phone]

## 2021-02-01 NOTE — Progress Notes (Signed)
[employer] sunction setup bedside and incentive spirometer provided to pt. Pt educated on proper techinque to clear secretions and train breathing technique. Pt verbalized and demonstrated understanding.

## 2021-02-02 ENCOUNTER — Other Ambulatory Visit: Payer: Self-pay

## 2021-02-02 DIAGNOSIS — J9622 Acute and chronic respiratory failure with hypercapnia: Secondary | ICD-10-CM | POA: Diagnosis not present

## 2021-02-02 DIAGNOSIS — J9621 Acute and chronic respiratory failure with hypoxia: Secondary | ICD-10-CM | POA: Diagnosis not present

## 2021-02-02 LAB — BASIC METABOLIC PANEL
Anion gap: 6 (ref 5–15)
BUN: 23 mg/dL (ref 8–23)
CO2: 42 mmol/L — ABNORMAL HIGH (ref 22–32)
Calcium: 9.3 mg/dL (ref 8.9–10.3)
Chloride: 92 mmol/L — ABNORMAL LOW (ref 98–111)
Creatinine, Ser: 0.85 mg/dL (ref 0.61–1.24)
GFR, Estimated: >60 mL/min (ref >60)
Glucose, Bld: 130 mg/dL — ABNORMAL HIGH (ref 70–99)
Potassium: 4 mmol/L (ref 3.5–5.1)
Sodium: 140 mmol/L (ref 135–145)

## 2021-02-02 LAB — CBC WITH DIFFERENTIAL/PLATELET
Abs Immature Granulocytes: 0.02 10*3/uL (ref 0.00–0.07)
Basophils Absolute: 0 10*3/uL (ref 0.0–0.1)
Basophils Relative: 0 %
Eosinophils Absolute: 0 10*3/uL (ref 0.0–0.5)
Eosinophils Relative: 0 %
HCT: 43.1 % (ref 39.0–52.0)
Hemoglobin: 11.1 g/dL — ABNORMAL LOW (ref 13.0–17.0)
Immature Granulocytes: 0 %
Lymphocytes Relative: 10 %
Lymphs Abs: 1 10*3/uL (ref 0.7–4.0)
MCH: 22.6 pg — ABNORMAL LOW (ref 26.0–34.0)
MCHC: 25.8 g/dL — ABNORMAL LOW (ref 30.0–36.0)
MCV: 87.6 fL (ref 80.0–100.0)
Monocytes Absolute: 0.6 10*3/uL (ref 0.1–1.0)
Monocytes Relative: 6 %
Neutro Abs: 8.1 10*3/uL — ABNORMAL HIGH (ref 1.7–7.7)
Neutrophils Relative %: 84 %
Platelets: 319 10*3/uL (ref 150–400)
RBC: 4.92 MIL/uL (ref 4.22–5.81)
RDW: 16.3 % — ABNORMAL HIGH (ref 11.5–15.5)
WBC: 9.7 10*3/uL (ref 4.0–10.5)
nRBC: 0 % (ref 0.0–0.2)

## 2021-02-02 LAB — MAGNESIUM: Magnesium: 2.3 mg/dL (ref 1.7–2.4)

## 2021-02-02 LAB — PHOSPHORUS: Phosphorus: 3.8 mg/dL (ref 2.5–4.6)

## 2021-02-02 MED ORDER — PREDNISONE 10 MG PO TABS
ORAL_TABLET | ORAL | 0 refills | Status: DC
  Filled 2021-02-02: qty 30, 12d supply, fill #0

## 2021-02-02 MED ORDER — FUROSEMIDE 40 MG PO TABS
40.0000 mg | ORAL_TABLET | Freq: Every day | ORAL | Status: DC
  Administered 2021-02-02: 40 mg via ORAL
  Filled 2021-02-02: qty 1

## 2021-02-02 MED ORDER — COVID-19 MRNA VAC-TRIS(PFIZER) 30 MCG/0.3ML IM SUSP
0.3000 mL | Freq: Once | INTRAMUSCULAR | Status: AC
  Administered 2021-02-02: 0.3 mL via INTRAMUSCULAR
  Filled 2021-02-02: qty 0.3

## 2021-02-02 MED ORDER — PREDNISONE 20 MG PO TABS
50.0000 mg | ORAL_TABLET | Freq: Every day | ORAL | Status: DC
  Administered 2021-02-02: 50 mg via ORAL
  Filled 2021-02-02: qty 2

## 2021-02-02 NOTE — Progress Notes (Signed)
Patient placed himself on his home BiPAP at this time. RT bled 3Lpm in BiPAP. Advised patient to call if he needs any assistance during the night.

## 2021-02-02 NOTE — Care Management Important Message (Signed)
Important Message  Patient Details  Name: Jared [name] MRN: [id number] Date of Birth: 02-27-1956   Medicare Important Message Given:  Yes     Oree Hislop Stefan Church 02/02/2021, 2:58 PM

## 2021-02-02 NOTE — Evaluation (Signed)
Occupational Therapy Evaluation Patient Details Name: Jared [name] MRN: [id number] DOB: 10-14-1955 Today's Date: 02/02/2021    History of Present Illness Pt is a 65 y.o.M who presents with acute on chronic respiratory failure with hypoxia and hypercapnia. Significant PMH: CAD, combined heart failure, stroke, GIB, paroxysmal F. aib, COPD on 4L O2, smoker.   Clinical Impression   PTA patient was living in a boarding house and was grossly I with ADLs/IADLs without AD. Patient currently functioning slightly below baseline demonstrating observed ADLs including toileting/hygiene/clothing management and ADL transfers with supervision A for safety/line management. Patient also limited by deficits listed below including decreased balance, decreased cardiopulmonary endurance, poor activity pacing and decreased safety awareness and would benefit from continued acute OT services in prep for safe d/c home. OT will continue to follow acutely.     Follow Up Recommendations  No OT follow up    Equipment Recommendations  Tub/shower seat    Recommendations for Other Services       Precautions / Restrictions Precautions Precautions: Other (comment) Precaution Comments: watch O2 Restrictions Weight Bearing Restrictions: No      Mobility Bed Mobility Overal bed mobility: Modified Independent                  Transfers Overall transfer level: Modified independent Equipment used: Rolling walker (2 wheeled);None                  Balance Overall balance assessment: Mild deficits observed, not formally tested                                         ADL either performed or assessed with clinical judgement   ADL Overall ADL's : Needs assistance/impaired                         Toilet Transfer: Supervision/safety;RW Statistician Details (indicate cue type and reason): Supervision A for line management Toileting- Clothing Manipulation and Hygiene:  Supervision/safety;Sit to/from stand Toileting - Clothing Manipulation Details (indicate cue type and reason): Supervision A for line management     Functional mobility during ADLs: Supervision/safety       Vision   Vision Assessment?: No apparent visual deficits     Perception     Praxis      Pertinent Vitals/Pain Pain Assessment: No/denies pain     Hand Dominance Right   Extremity/Trunk Assessment Upper Extremity Assessment Upper Extremity Assessment: Overall WFL for tasks assessed   Lower Extremity Assessment Lower Extremity Assessment: Defer to PT evaluation   Cervical / Trunk Assessment Cervical / Trunk Assessment: Normal   Communication Communication Communication: No difficulties   Cognition Arousal/Alertness: Awake/alert Behavior During Therapy: WFL for tasks assessed/performed Overall Cognitive Status: Within Functional Limits for tasks assessed                                     General Comments  SpO2 97% on 3L at rest. Titrated to 4L with mobility. SpO2 92-94% with mobility up to 242ft with use of RW.    Exercises     Shoulder Instructions      Home Living Family/patient expects to be discharged to:: Private residence Living Arrangements: Alone (lives in boarding house)   Type of Home: House Home Access: Stairs to enter [employer]  of Steps: 3   Home Layout: One level     Bathroom Shower/Tub: Chief Strategy Officer: Standard     Home Equipment: None          Prior Functioning/Environment Level of Independence: Independent                 OT Problem List: Decreased activity tolerance;Cardiopulmonary status limiting activity      OT Treatment/Interventions: Energy conservation;DME and/or AE instruction;Self-care/ADL training    OT Goals(Current goals can be found in the care plan section) Acute Rehab OT Goals Patient Stated Goal: improved O2 OT Goal Formulation: With patient Time For  Goal Achievement: 02/16/21 Potential to Achieve Goals: Good ADL Goals Additional ADL Goal #1: Patient will complete ADLs with Mod I and use of AD/DME demonstrating good safety awareness in prep for safe d/c home. Additional ADL Goal #2: Patient will maintain SpO2 >90% on 3L O2 via [place] during ADLs in prep for safe return home.  OT Frequency: Min 2X/week   Barriers to D/C:            Co-evaluation              AM-PAC OT "6 Clicks" Daily Activity     Outcome Measure Help from another person eating meals?: None Help from another person taking care of personal grooming?: A Little Help from another person toileting, which includes using toliet, bedpan, or urinal?: A Little Help from another person bathing (including washing, rinsing, drying)?: A Little Help from another person to put on and taking off regular upper body clothing?: A Little Help from another person to put on and taking off regular lower body clothing?: A Little 6 Click Score: 19   End of Session Equipment Utilized During Treatment: Rolling walker;Oxygen Nurse Communication: Mobility status  Activity Tolerance: Patient tolerated treatment well Patient left: Other (comment) (Seated on commode in bathroom, RN aware)  OT Visit Diagnosis: Unsteadiness on feet (R26.81)                Time: 8546-2703 OT Time Calculation (min): 14 min Charges:  OT General Charges $OT Visit: 1 Visit OT Evaluation $OT Eval Low Complexity: 1 Low  Kleo Dungee H. OTR/L Supplemental OT, Department of rehab services [phone]  Felise Georgia R H. 02/02/2021, 10:16 AM

## 2021-02-02 NOTE — Discharge Summary (Signed)
Physician Discharge Summary  Jared [name] YCX:[id number] DOB: Jan 24, 1956 DOA: 01/30/2021  PCP: [name], MD  Admit date: 01/30/2021 Discharge date: 02/02/2021  Admitted From: Home Disposition: Home with home health  Recommendations for Outpatient Follow-up:  Follow up with PCP in 1-2 weeks Please obtain BMP/CBC in one week   Home Health: PT/OT Equipment/Devices: Noninvasive ventilator  Discharge Condition: Stable CODE STATUS: Full code Diet recommendation: Low-salt diet  Discharge summary: 65 year old gentleman with history of coronary artery disease, combined heart failure, history of stroke and GI bleed, paroxysmal A. fib on Xarelto, COPD on 4 L oxygen at home and smoker presented to cardiologist office and found to be hypoxic 81% on 4 L and wheezing so sent to ER.  Patient stated feeling bad for about 2 weeks.  Short of breath, coughing and sneezing.  He smokes 1 pack/day.  In the emergency room afebrile.  Blood pressure is stable.  Oxygen 77 to 99% on 4 L nasal cannula.  BNP 1093.  Troponins normal.  Chest x-ray pulmonary edema.  Given albuterol Lasix DuoNeb magnesium Solu-Medrol and asked to admit.  Ultimately a blood gas showed hypercarbia and needed BiPAP support.  Clinically improved today.     Assessment & Plan:    Acute on chronic respiratory failure with hypoxia and hypercapnia: Likely secondary to COPD exacerbation.  Patient with severe hypercapnia on presentation.  Responded well to BiPAP. Treated with aggressive bronchodilator therapy, IV steroids and inhalers and steroids and antibiotics with good clinical response. Currently back on 3 L of oxygen in the daytime, using Trelegy ventilator at night with good response. Discharging home with prolonged steroid taper, he already has adequate bronchodilators and steroid inhalers. Due to significant hypercapnia on presentation, he does qualify for noninvasive ventilator at home that will prevent exacerbations, complications  and readmissions to the hospital.   Suspected to acute on chronic combined heart failure: Heart failure exacerbation ruled out.  With increased lower extremity edema, elevated BNP.  On Lasix.  2D echocardiogram with ejection fraction 45 to 50%.  Comparable to previous echocardiogram.  Patient will go back on his home medication doses of 40 mg Lasix every day.  Euvolemic on exam today.  Permanent A. fib: Rate controlled.  Not on any rate control medications.  Therapeutic on Xarelto.  Essential hypertension: Stable.  Patient is medically stable to discharge home.  Due to significant debility, he will benefit with home health therapies including physical therapy.  This was prescribed. Smoking cessation counseling done.       Discharge Diagnoses:  Principal Problem:   Acute on chronic respiratory failure with hypoxia and hypercapnia (HCC) Active Problems:   Essential hypertension   CAD (coronary artery disease)   Cigarette smoker   Prediabetes   COPD with acute exacerbation (HCC)   Acute on chronic combined systolic and diastolic CHF (congestive heart failure) (HCC)   Atrial flutter (HCC)   Iron deficiency anemia   Elevated troponin    Discharge Instructions  Discharge Instructions     Call MD for:  difficulty breathing, headache or visual disturbances   Complete by: As directed    Diet - low sodium heart healthy   Complete by: As directed    Increase activity slowly   Complete by: As directed       Allergies as of 02/02/2021       Reactions   Lisinopril Cough        Medication List     STOP taking these medications    cetirizine  10 MG tablet Commonly known as: ZYRTEC   pantoprazole 40 MG tablet Commonly known as: Protonix       TAKE these medications    albuterol 108 (90 Base) MCG/ACT inhaler Commonly known as: VENTOLIN HFA INHALE 2 PUFFS INTO THE LUNGS EVERY 6 (SIX) HOURS AS NEEDED FOR WHEEZING OR SHORTNESS OF BREATH.   albuterol (2.5 MG/3ML)  0.083% nebulizer solution Commonly known as: PROVENTIL Take 3 mLs (2.5 mg total) by nebulization every 4 (four) hours as needed for wheezing or shortness of breath.   atorvastatin 80 MG tablet Commonly known as: LIPITOR TAKE 1 TABLET EVERY DAY   Breztri Aerosphere 160-9-4.8 MCG/ACT Aero Generic drug: Budeson-Glycopyrrol-Formoterol INHALE 2 PUFFS INTO THE LUNGS 2 (TWO) TIMES DAILY.   ferrous sulfate 325 (65 FE) MG tablet Take 1 tablet (325 mg total) by mouth 2 (two) times daily with a meal.   fluticasone 50 MCG/ACT nasal spray Commonly known as: FLONASE Place 1 spray into both nostrils daily.   furosemide 40 MG tablet Commonly known as: LASIX TAKE 1 TABLET EVERY DAY   gabapentin 300 MG capsule Commonly known as: NEURONTIN TAKE 1 CAPSULE (300 MG TOTAL) BY MOUTH AT BEDTIME. What changed: how much to take   Misc. Devices Misc Inhale 3 L into the lungs as needed. Portable oxygen concentrator. Diagnosis COPD.   multivitamin with minerals Tabs tablet Take 1 tablet by mouth daily.   nitroGLYCERIN 0.4 MG SL tablet Commonly known as: NITROSTAT Place 1 tablet (0.4 mg total) under the tongue every 5 (five) minutes as needed for chest pain.   predniSONE 10 MG tablet Commonly known as: DELTASONE 4 tabs daily for 3 days 3 tabs daily for 3 days 2 tabs daily for 3 days 1 tab daily for 3 days   Xarelto 20 MG Tabs tablet Generic drug: rivaroxaban TAKE 1 TABLET EVERY DAY WITH SUPPER What changed: See the new instructions.               Durable Medical Equipment  (From admission, onward)           Start     Ordered   02/02/21 0947  DME Tub Bench  Once        02/02/21 0946   02/02/21 0947  DME Walker  Once       Question Answer Comment  Walker: With 5 Inch Wheels   Patient needs a walker to treat with the following condition COPD with acute exacerbation (HCC)      02/02/21 0946   02/01/21 1521  For home use only DME Shower stool  Once        02/01/21 1520    02/01/21 1520  For home use only DME Walker rolling  Once       Question Answer Comment  Walker: With 5 Inch Wheels   Patient needs a walker to treat with the following condition Weakness      02/01/21 1520            Follow-up Information     [name], MD Follow up in 2 week(s).   Specialty: Family Medicine Contact information: [address] [place] [address] [phone]                Allergies  Allergen Reactions   Lisinopril Cough    Consultations: None   Procedures/Studies: DG Chest Portable 1 View  Result Date: 01/30/2021 CLINICAL DATA:  Hypoxia EXAM: PORTABLE CHEST 1 VIEW COMPARISON:  11/18/2020 FINDINGS: Cardiomegaly, which is stable  to slightly increased in size. Aortic atherosclerosis. No focal pulmonary opacity. No significant pleural fluid. Redemonstrated healed left rib fractures. No acute osseous abnormality. IMPRESSION: 1. Stable to slightly increased cardiomegaly without evidence of pulmonary edema. 2.  Aortic Atherosclerosis (ICD10-I70.0). Electronically Signed   By: Wiliam KeAlison  Vasan M.D.   On: 01/30/2021 12:28   ECHOCARDIOGRAM COMPLETE  Result Date: 01/31/2021    ECHOCARDIOGRAM REPORT   Patient Name:   Jared [name] Date of Exam: 01/31/2021 Medical Rec #:  [id number]    Height:       75.0 in Accession #:    [id number]   Weight:       220.5 lb Date of Birth:  06/09/1956     BSA:          2.287 m Patient Age:    65 years     BP:           119/82 mmHg Patient Gender: M            HR:           94 bpm. Exam Location:  Inpatient Procedure: Cardiac Doppler, Color Doppler, 2D Echo and 3D Echo Indications:    CHF-Acute Systolic I50.21  History:        Patient has prior history of Echocardiogram examinations.                 Respiratory Failure on Bipap.  Sonographer:    Roosvelt Maserachel Lane RDCS Referring Phys: [id number] ALLISON WOLFE IMPRESSIONS  1. Left ventricular ejection fraction, by estimation, is 45 to 50%. Left ventricular ejection fraction by 3D volume  is 48 %. The left ventricle has mildly decreased function. The left ventricle demonstrates global hypokinesis. There is moderate left ventricular hypertrophy. Left ventricular diastolic parameters are consistent with Grade II diastolic dysfunction (pseudonormalization).  2. Right ventricular systolic function is moderately reduced. The right ventricular size is moderately enlarged. There is moderately elevated pulmonary artery systolic pressure while on BiPAP.  3. Left atrial size was moderately dilated.  4. Right atrial size was moderately dilated.  5. The mitral valve is normal in structure. Trivial mitral valve regurgitation. No evidence of mitral stenosis.  6. The aortic valve is tricuspid. There is mild calcification of the aortic valve. Aortic valve regurgitation is trivial. Mild aortic valve sclerosis is present, with no evidence of aortic valve stenosis. FINDINGS  Left Ventricle: Left ventricular ejection fraction, by estimation, is 45 to 50%. Left ventricular ejection fraction by 3D volume is 48 %. The left ventricle has mildly decreased function. The left ventricle demonstrates global hypokinesis. The left ventricular internal cavity size was normal in size. There is moderate left ventricular hypertrophy. Left ventricular diastolic parameters are consistent with Grade II diastolic dysfunction (pseudonormalization). Right Ventricle: The right ventricular size is moderately enlarged. No increase in right ventricular wall thickness. Right ventricular systolic function is moderately reduced. There is moderately elevated pulmonary artery systolic pressure. The tricuspid  regurgitant velocity is 3.14 m/s, and with an assumed right atrial pressure of 15 mmHg, the estimated right ventricular systolic pressure is 54.4 mmHg. Left Atrium: Left atrial size was moderately dilated. Right Atrium: Right atrial size was moderately dilated. Pericardium: Trivial pericardial effusion is present. Presence of pericardial fat  pad. Mitral Valve: The mitral valve is normal in structure. Trivial mitral valve regurgitation. No evidence of mitral valve stenosis. Tricuspid Valve: The tricuspid valve is normal in structure. Tricuspid valve regurgitation is mild . No evidence of tricuspid stenosis. Aortic Valve: The aortic valve  is tricuspid. There is mild calcification of the aortic valve. Aortic valve regurgitation is trivial. Mild aortic valve sclerosis is present, with no evidence of aortic valve stenosis. Aortic valve mean gradient measures 5.0 mmHg. Aortic valve peak gradient measures 9.9 mmHg. Aortic valve area, by VTI measures 3.06 cm. Pulmonic Valve: The pulmonic valve was normal in structure. Pulmonic valve regurgitation is not visualized. No evidence of pulmonic stenosis. Aorta: The aortic root is normal in size and structure. Venous: IVC assessment for right atrial pressure unable to be performed due to mechanical ventilation. IAS/Shunts: No atrial level shunt detected by color flow Doppler.  LEFT VENTRICLE PLAX 2D LVIDd:         5.70 cm         Diastology LVIDs:         4.30 cm         LV e' medial:    5.98 cm/s LV PW:         1.50 cm         LV E/e' medial:  17.4 LV IVS:        1.30 cm         LV e' lateral:   8.16 cm/s LVOT diam:     2.20 cm         LV E/e' lateral: 12.7 LV SV:         96 LV SV Index:   42 LVOT Area:     3.80 cm        3D Volume EF                                LV 3D EF:    Left                                             ventricul                                             ar                                             ejection                                             fraction                                             by 3D                                             volume is  48 %.                                 3D Volume EF:                                3D EF:        48 %                                LV EDV:       148 ml                                LV  ESV:       77 ml                                LV SV:        71 ml RIGHT VENTRICLE            IVC RV Basal diam:  4.50 cm    IVC diam: 2.70 cm RV Mid diam:    3.80 cm RV S prime:     8.40 cm/s TAPSE (M-mode): 2.0 cm LEFT ATRIUM              Index       RIGHT ATRIUM           Index LA diam:        3.60 cm  1.57 cm/m  RA Area:     26.60 cm LA Vol (A2C):   92.8 ml  40.57 ml/m RA Volume:   91.40 ml  39.96 ml/m LA Vol (A4C):   100.0 ml 43.72 ml/m LA Biplane Vol: 104.0 ml 45.47 ml/m  AORTIC VALVE AV Area (Vmax):    3.37 cm AV Area (Vmean):   3.01 cm AV Area (VTI):     3.06 cm AV Vmax:           157.00 cm/s AV Vmean:          106.000 cm/s AV VTI:            0.313 m AV Peak Grad:      9.9 mmHg AV Mean Grad:      5.0 mmHg LVOT Vmax:         139.00 cm/s LVOT Vmean:        84.000 cm/s LVOT VTI:          0.252 m LVOT/AV VTI ratio: 0.81  AORTA Ao Root diam: 3.40 cm MITRAL VALVE                TRICUSPID VALVE MV Area (PHT): 3.53 cm     TR Peak grad:   39.4 mmHg MV Decel Time: 215 msec     TR Vmax:        314.00 cm/s MV E velocity: 104.00 cm/s MV A velocity: 83.00 cm/s   SHUNTS MV E/A ratio:  1.25         Systemic VTI:  0.25 m                             Systemic Diam: 2.20 cm [employer]  Jacques Navy MD Electronically signed by Weston Brass MD Signature Date/Time: 01/31/2021/10:56:06 AM    Final    (Echo, Carotid, EGD, Colonoscopy, ERCP)    Subjective: Patient seen and examined.  No overnight events.  He could use the BiPAP himself.  Walked around.  Currently on 3 L oxygen.  Eager to go home.  His sister will pick him up.   Discharge Exam: Vitals:   02/02/21 0721 02/02/21 0744  BP:  140/80  Pulse:  78  Resp:    Temp:  97.9 F (36.6 C)  SpO2: 93% 95%   Vitals:   02/02/21 0448 02/02/21 0720 02/02/21 0721 02/02/21 0744  BP:    140/80  Pulse:    78  Resp:      Temp:    97.9 F (36.6 C)  TempSrc:    Oral  SpO2:  93% 93% 95%  Weight: 100 kg       General: Pt is alert, awake, not in acute distress.  He  is currently using 3 L of oxygen. Cardiovascular: RRR, S1/S2 +, no rubs, no gallops Respiratory: CTA bilaterally, no wheezing, no rhonchi Abdominal: Soft, NT, ND, bowel sounds + Extremities: no edema, no cyanosis    The results of significant diagnostics from this hospitalization (including imaging, microbiology, ancillary and laboratory) are listed below for reference.     Microbiology: Recent Results (from the past 240 hour(s))  Resp Panel by RT-PCR (Flu A&B, Covid) Nasopharyngeal Swab     Status: None   Collection Time: 01/30/21 11:45 AM   Specimen: Nasopharyngeal Swab; Nasopharyngeal(NP) swabs in vial transport medium  Result Value Ref Range Status   SARS Coronavirus 2 by RT PCR NEGATIVE NEGATIVE Final    Comment: (NOTE) SARS-CoV-2 target nucleic acids are NOT DETECTED.  The SARS-CoV-2 RNA is generally detectable in upper respiratory specimens during the acute phase of infection. The lowest concentration of SARS-CoV-2 viral copies this assay can detect is 138 copies/mL. A negative result does not preclude SARS-Cov-2 infection and should not be used as the sole basis for treatment or other patient management decisions. A negative result may occur with  improper specimen collection/handling, submission of specimen other than nasopharyngeal swab, presence of viral mutation(s) within the areas targeted by this assay, and inadequate number of viral copies(<138 copies/mL). A negative result must be combined with clinical observations, patient history, and epidemiological information. The expected result is Negative.  Fact Sheet for Patients:  BloggerCourse.com  Fact Sheet for Healthcare Providers:  SeriousBroker.it  This test is no t yet approved or cleared by the [place] FDA and  has been authorized for detection and/or diagnosis of SARS-CoV-2 by FDA under an Emergency Use Authorization (EUA). This EUA will remain  in  effect (meaning this test can be used) for the duration of the COVID-19 declaration under Section 564(b)(1) of the Act, 21 U.S.C.section 360bbb-3(b)(1), unless the authorization is terminated  or revoked sooner.       Influenza A by PCR NEGATIVE NEGATIVE Final   Influenza B by PCR NEGATIVE NEGATIVE Final    Comment: (NOTE) The Xpert Xpress SARS-CoV-2/FLU/RSV plus assay is intended as an aid in the diagnosis of influenza from Nasopharyngeal swab specimens and should not be used as a sole basis for treatment. Nasal washings and aspirates are unacceptable for Xpert Xpress SARS-CoV-2/FLU/RSV testing.  Fact Sheet for Patients: BloggerCourse.com  Fact Sheet for Healthcare Providers: SeriousBroker.it  This test is not yet approved or cleared by the [place] and has been authorized  for detection and/or diagnosis of SARS-CoV-2 by FDA under an Emergency Use Authorization (EUA). This EUA will remain in effect (meaning this test can be used) for the duration of the COVID-19 declaration under Section 564(b)(1) of the Act, 21 U.S.C. section 360bbb-3(b)(1), unless the authorization is terminated or revoked.  Performed at [facility], [address], [place], [place] [address]      Labs: BNP (last 3 results) Recent Labs    11/01/20 1012 01/30/21 1111  BNP 700.5* 1,093.1*   Basic Metabolic Panel: Recent Labs  Lab 01/30/21 1140 01/30/21 1524 01/30/21 1941 01/30/21 2134 01/30/21 2323 02/02/21 0348  NA 142 142 143  --  142 140  K 3.6 3.8 4.2  --  4.0 4.0  CL 98  --   --   --  94* 92*  CO2 37*  --   --   --  42* 42*  GLUCOSE 104*  --   --   --  167* 130*  BUN 10  --   --   --  13 23  CREATININE 0.83  --   --   --  0.97 0.85  CALCIUM 8.6*  --   --   --  8.6* 9.3  MG  --   --   --  2.1  --  2.3  PHOS  --   --   --   --   --  3.8   Liver Function Tests: Recent Labs  Lab 01/30/21 1140  AST 34  ALT 32  ALKPHOS  69  BILITOT 0.5  PROT 6.6  ALBUMIN 3.8   No results for input(s): LIPASE, AMYLASE in the last 168 hours. No results for input(s): AMMONIA in the last 168 hours. CBC: Recent Labs  Lab 01/30/21 1140 01/30/21 1524 01/30/21 1941 01/30/21 2323 02/02/21 0348  WBC 6.7  --   --  8.9 9.7  NEUTROABS 3.9  --   --   --  8.1*  HGB 11.8* 16.3 16.7 12.0* 11.1*  HCT 47.1 48.0 49.0 49.5 43.1  MCV 91.1  --   --  92.9 87.6  PLT 346  --   --  333 319   Cardiac Enzymes: No results for input(s): CKTOTAL, CKMB, CKMBINDEX, TROPONINI in the last 168 hours. BNP: Invalid input(s): POCBNP CBG: No results for input(s): GLUCAP in the last 168 hours. D-Dimer No results for input(s): DDIMER in the last 72 hours. Hgb A1c No results for input(s): HGBA1C in the last 72 hours. Lipid Profile No results for input(s): CHOL, HDL, LDLCALC, TRIG, CHOLHDL, LDLDIRECT in the last 72 hours. Thyroid function studies No results for input(s): TSH, T4TOTAL, T3FREE, THYROIDAB in the last 72 hours.  Invalid input(s): FREET3 Anemia work up No results for input(s): VITAMINB12, FOLATE, FERRITIN, TIBC, IRON, RETICCTPCT in the last 72 hours. Urinalysis    Component Value Date/Time   COLORURINE YELLOW 01/30/2021 1150   APPEARANCEUR CLEAR 01/30/2021 1150   LABSPEC 1.008 01/30/2021 1150   PHURINE 6.0 01/30/2021 1150   GLUCOSEU NEGATIVE 01/30/2021 1150   HGBUR NEGATIVE 01/30/2021 1150   BILIRUBINUR NEGATIVE 01/30/2021 1150   KETONESUR NEGATIVE 01/30/2021 1150   PROTEINUR NEGATIVE 01/30/2021 1150   NITRITE NEGATIVE 01/30/2021 1150   LEUKOCYTESUR NEGATIVE 01/30/2021 1150   Sepsis Labs Invalid input(s): PROCALCITONIN,  WBC,  LACTICIDVEN Microbiology Recent Results (from the past 240 hour(s))  Resp Panel by RT-PCR (Flu A&B, Covid) Nasopharyngeal Swab     Status: None   Collection Time: 01/30/21 11:45 AM  Specimen: Nasopharyngeal Swab; Nasopharyngeal(NP) swabs in vial transport medium  Result Value Ref Range Status    SARS Coronavirus 2 by RT PCR NEGATIVE NEGATIVE Final    Comment: (NOTE) SARS-CoV-2 target nucleic acids are NOT DETECTED.  The SARS-CoV-2 RNA is generally detectable in upper respiratory specimens during the acute phase of infection. The lowest concentration of SARS-CoV-2 viral copies this assay can detect is 138 copies/mL. A negative result does not preclude SARS-Cov-2 infection and should not be used as the sole basis for treatment or other patient management decisions. A negative result may occur with  improper specimen collection/handling, submission of specimen other than nasopharyngeal swab, presence of viral mutation(s) within the areas targeted by this assay, and inadequate number of viral copies(<138 copies/mL). A negative result must be combined with clinical observations, patient history, and epidemiological information. The expected result is Negative.  Fact Sheet for Patients:  BloggerCourse.com  Fact Sheet for Healthcare Providers:  SeriousBroker.it  This test is no t yet approved or cleared by the [place] FDA and  has been authorized for detection and/or diagnosis of SARS-CoV-2 by FDA under an Emergency Use Authorization (EUA). This EUA will remain  in effect (meaning this test can be used) for the duration of the COVID-19 declaration under Section 564(b)(1) of the Act, 21 U.S.C.section 360bbb-3(b)(1), unless the authorization is terminated  or revoked sooner.       Influenza A by PCR NEGATIVE NEGATIVE Final   Influenza B by PCR NEGATIVE NEGATIVE Final    Comment: (NOTE) The Xpert Xpress SARS-CoV-2/FLU/RSV plus assay is intended as an aid in the diagnosis of influenza from Nasopharyngeal swab specimens and should not be used as a sole basis for treatment. Nasal washings and aspirates are unacceptable for Xpert Xpress SARS-CoV-2/FLU/RSV testing.  Fact Sheet for  Patients: BloggerCourse.com  Fact Sheet for Healthcare Providers: SeriousBroker.it  This test is not yet approved or cleared by the [place] FDA and has been authorized for detection and/or diagnosis of SARS-CoV-2 by FDA under an Emergency Use Authorization (EUA). This EUA will remain in effect (meaning this test can be used) for the duration of the COVID-19 declaration under Section 564(b)(1) of the Act, 21 U.S.C. section 360bbb-3(b)(1), unless the authorization is terminated or revoked.  Performed at [facility], [address], [place], [place] [address]      Time coordinating discharge:  35 minutes  SIGNED:   Dorcas Carrow, MD  [facility] 02/02/2021, 9:47 AM

## 2021-02-03 ENCOUNTER — Telehealth: Payer: Self-pay

## 2021-02-03 ENCOUNTER — Other Ambulatory Visit: Payer: Self-pay

## 2021-02-03 NOTE — Telephone Encounter (Signed)
Transition Care Management Follow-up Telephone Call Date of discharge and from where: 02/02/2021, [facility]  How have you been since you were released from the hospital? He said he is feeling all right Any questions or concerns? No  Items Reviewed: Did the pt receive and understand the discharge instructions provided? Yes  Medications obtained and verified? Yes  - he said he has all of his medications, He just picked up the prednisone and did not have any questions about his med regime.  Other? No  Any new allergies since your discharge? No  Do you have support at home?  He lives in a rooming house but has family in the area,   Home Care and Equipment/Supplies: Were home health services ordered? no If so, what is the name of the agency? N/a  Has the agency set up a time to come to the patient's home? not applicable Were any new equipment or medical supplies ordered?  Yes: trilogy NIV, RW, Shower chair What is the name of the medical supply agency? [employer] Were you able to get the supplies/equipment? yes Do you have any questions related to the use of the equipment or supplies? No  He has O2 at home and said he is using it @ 4L most of the time and he also uses the trilogy at night. The O2 is a home fill system through [employer].  Has nebulizer.   Functional Questionnaire: (I = Independent and D = Dependent) ADLs: independent. Has RW to use with ambulation.    Follow up appointments reviewed:  PCP Hospital f/u appt confirmed? Yes  Scheduled to see Dr Alvis Lemmings on 02/21/2021 @ 1050. Specialist Hospital f/u appt confirmed? Yes  Scheduled to see pulmonary on 04/05/2021.  Are transportation arrangements needed? No  - he said that he has transportation.  If their condition worsens, is the pt aware to call PCP or go to the Emergency Dept.? Yes Was the patient provided with contact information for the PCP's office or ED? Yes Was to pt encouraged to call back with questions or  concerns? Yes

## 2021-02-03 NOTE — Telephone Encounter (Signed)
From the discharge call.  He has an appointment with Dr Alvis Lemmings 02/21/2021.    He said he is feeling all right  He has all of his medications, He just picked up the prednisone and did not have any questions about his med regime.    He continues to live in the rooming house and  has O2 at home and said he is using it @ 4L most of the time and he also uses the trilogy at night. The O2 is a home fill system through [employer].   Using RW with ambulation.

## 2021-02-03 NOTE — Patient Outreach (Signed)
[facility]) Care Management  02/03/2021  Jared [name] [birth year] [id number]     Transition of Care Referral  Referral Date: 02/03/2021 Referral Source: [facility] Discharge Report Diagnosis: "hypoxemia" Date of Discharge: 02/02/2021 Facility: [facility]   Referral received. Transition of care calls being completed via EMMI-automated calls.      Plan: RN CM will close referral.   Antionette Fairy, RN,BSN,CCM [facility] Care Management Telephonic Care Management Coordinator Direct Phone: [phone] Toll Free: [phone] Fax: [phone]

## 2021-02-07 ENCOUNTER — Inpatient Hospital Stay (HOSPITAL_COMMUNITY)
Admission: EM | Admit: 2021-02-07 | Discharge: 2021-02-11 | DRG: 312 | Disposition: A | Discharge status: Home / Self Care | Payer: Medicare HMO | Attending: Internal Medicine | Admitting: Internal Medicine

## 2021-02-07 ENCOUNTER — Other Ambulatory Visit: Payer: Self-pay

## 2021-02-07 ENCOUNTER — Emergency Department (HOSPITAL_COMMUNITY): Payer: Medicare HMO

## 2021-02-07 ENCOUNTER — Encounter (HOSPITAL_COMMUNITY): Payer: Self-pay

## 2021-02-07 DIAGNOSIS — Z7901 Long term (current) use of anticoagulants: Secondary | ICD-10-CM | POA: Diagnosis not present

## 2021-02-07 DIAGNOSIS — I4891 Unspecified atrial fibrillation: Secondary | ICD-10-CM | POA: Diagnosis not present

## 2021-02-07 DIAGNOSIS — M1711 Unilateral primary osteoarthritis, right knee: Secondary | ICD-10-CM | POA: Diagnosis not present

## 2021-02-07 DIAGNOSIS — D500 Iron deficiency anemia secondary to blood loss (chronic): Secondary | ICD-10-CM | POA: Diagnosis not present

## 2021-02-07 DIAGNOSIS — Z7951 Long term (current) use of inhaled steroids: Secondary | ICD-10-CM | POA: Diagnosis not present

## 2021-02-07 DIAGNOSIS — M7989 Other specified soft tissue disorders: Secondary | ICD-10-CM | POA: Diagnosis not present

## 2021-02-07 DIAGNOSIS — Y92000 Kitchen of unspecified non-institutional (private) residence as  the place of occurrence of the external cause: Secondary | ICD-10-CM | POA: Diagnosis not present

## 2021-02-07 DIAGNOSIS — Z79899 Other long term (current) drug therapy: Secondary | ICD-10-CM | POA: Diagnosis not present

## 2021-02-07 DIAGNOSIS — J432 Centrilobular emphysema: Secondary | ICD-10-CM | POA: Diagnosis not present

## 2021-02-07 DIAGNOSIS — I4892 Unspecified atrial flutter: Secondary | ICD-10-CM | POA: Diagnosis not present

## 2021-02-07 DIAGNOSIS — I951 Orthostatic hypotension: Principal | ICD-10-CM | POA: Diagnosis present

## 2021-02-07 DIAGNOSIS — Z8673 Personal history of transient ischemic attack (TIA), and cerebral infarction without residual deficits: Secondary | ICD-10-CM | POA: Diagnosis not present

## 2021-02-07 DIAGNOSIS — Z888 Allergy status to other drugs, medicaments and biological substances status: Secondary | ICD-10-CM | POA: Diagnosis not present

## 2021-02-07 DIAGNOSIS — J9611 Chronic respiratory failure with hypoxia: Secondary | ICD-10-CM | POA: Diagnosis not present

## 2021-02-07 DIAGNOSIS — E876 Hypokalemia: Secondary | ICD-10-CM | POA: Diagnosis not present

## 2021-02-07 DIAGNOSIS — J439 Emphysema, unspecified: Secondary | ICD-10-CM | POA: Diagnosis not present

## 2021-02-07 DIAGNOSIS — J449 Chronic obstructive pulmonary disease, unspecified: Secondary | ICD-10-CM | POA: Diagnosis present

## 2021-02-07 DIAGNOSIS — I491 Atrial premature depolarization: Secondary | ICD-10-CM | POA: Diagnosis not present

## 2021-02-07 DIAGNOSIS — I5042 Chronic combined systolic (congestive) and diastolic (congestive) heart failure: Secondary | ICD-10-CM | POA: Diagnosis present

## 2021-02-07 DIAGNOSIS — Z8249 Family history of ischemic heart disease and other diseases of the circulatory system: Secondary | ICD-10-CM | POA: Diagnosis not present

## 2021-02-07 DIAGNOSIS — R55 Syncope and collapse: Secondary | ICD-10-CM | POA: Diagnosis present

## 2021-02-07 DIAGNOSIS — J9612 Chronic respiratory failure with hypercapnia: Secondary | ICD-10-CM | POA: Diagnosis present

## 2021-02-07 DIAGNOSIS — S82839A Other fracture of upper and lower end of unspecified fibula, initial encounter for closed fracture: Secondary | ICD-10-CM

## 2021-02-07 DIAGNOSIS — I10 Essential (primary) hypertension: Secondary | ICD-10-CM | POA: Diagnosis not present

## 2021-02-07 DIAGNOSIS — I110 Hypertensive heart disease with heart failure: Secondary | ICD-10-CM | POA: Diagnosis present

## 2021-02-07 DIAGNOSIS — M25461 Effusion, right knee: Secondary | ICD-10-CM | POA: Diagnosis not present

## 2021-02-07 DIAGNOSIS — I2510 Atherosclerotic heart disease of native coronary artery without angina pectoris: Secondary | ICD-10-CM | POA: Diagnosis not present

## 2021-02-07 DIAGNOSIS — W1839XA Other fall on same level, initial encounter: Secondary | ICD-10-CM | POA: Diagnosis present

## 2021-02-07 DIAGNOSIS — S82831A Other fracture of upper and lower end of right fibula, initial encounter for closed fracture: Secondary | ICD-10-CM | POA: Diagnosis present

## 2021-02-07 DIAGNOSIS — Z9981 Dependence on supplemental oxygen: Secondary | ICD-10-CM | POA: Diagnosis not present

## 2021-02-07 DIAGNOSIS — M25569 Pain in unspecified knee: Secondary | ICD-10-CM

## 2021-02-07 DIAGNOSIS — R000 Tachycardia, unspecified: Secondary | ICD-10-CM | POA: Diagnosis not present

## 2021-02-07 DIAGNOSIS — F17210 Nicotine dependence, cigarettes, uncomplicated: Secondary | ICD-10-CM | POA: Diagnosis not present

## 2021-02-07 DIAGNOSIS — I428 Other cardiomyopathies: Secondary | ICD-10-CM | POA: Diagnosis present

## 2021-02-07 DIAGNOSIS — I700 Atherosclerosis of aorta: Secondary | ICD-10-CM | POA: Diagnosis present

## 2021-02-07 DIAGNOSIS — Z20822 Contact with and (suspected) exposure to covid-19: Secondary | ICD-10-CM | POA: Diagnosis present

## 2021-02-07 DIAGNOSIS — D509 Iron deficiency anemia, unspecified: Secondary | ICD-10-CM | POA: Diagnosis present

## 2021-02-07 DIAGNOSIS — R0689 Other abnormalities of breathing: Secondary | ICD-10-CM | POA: Diagnosis not present

## 2021-02-07 DIAGNOSIS — I480 Paroxysmal atrial fibrillation: Secondary | ICD-10-CM | POA: Diagnosis present

## 2021-02-07 LAB — CBC WITH DIFFERENTIAL/PLATELET
Abs Immature Granulocytes: 0.01 10*3/uL (ref 0.00–0.07)
Basophils Absolute: 0 10*3/uL (ref 0.0–0.1)
Basophils Relative: 0 %
Eosinophils Absolute: 0.2 10*3/uL (ref 0.0–0.5)
Eosinophils Relative: 2 %
HCT: 48.1 % (ref 39.0–52.0)
Hemoglobin: 12 g/dL — ABNORMAL LOW (ref 13.0–17.0)
Immature Granulocytes: 0 %
Lymphocytes Relative: 37 %
Lymphs Abs: 3 10*3/uL (ref 0.7–4.0)
MCH: 23 pg — ABNORMAL LOW (ref 26.0–34.0)
MCHC: 24.9 g/dL — ABNORMAL LOW (ref 30.0–36.0)
MCV: 92.3 fL (ref 80.0–100.0)
Monocytes Absolute: 0.8 10*3/uL (ref 0.1–1.0)
Monocytes Relative: 9 %
Neutro Abs: 4.2 10*3/uL (ref 1.7–7.7)
Neutrophils Relative %: 52 %
Platelets: 196 10*3/uL (ref 150–400)
RBC: 5.21 MIL/uL (ref 4.22–5.81)
RDW: 16.7 % — ABNORMAL HIGH (ref 11.5–15.5)
WBC: 8.1 10*3/uL (ref 4.0–10.5)
nRBC: 0 % (ref 0.0–0.2)

## 2021-02-07 LAB — BRAIN NATRIURETIC PEPTIDE: B Natriuretic Peptide: 659.2 pg/mL — ABNORMAL HIGH (ref 0.0–100.0)

## 2021-02-07 LAB — I-STAT VENOUS BLOOD GAS, ED
Acid-Base Excess: 21 mmol/L — ABNORMAL HIGH (ref 0.0–2.0)
Bicarbonate: 53.1 mmol/L — ABNORMAL HIGH (ref 20.0–28.0)
Calcium, Ion: 1.1 mmol/L — ABNORMAL LOW (ref 1.15–1.40)
HCT: 47 % (ref 39.0–52.0)
Hemoglobin: 16 g/dL (ref 13.0–17.0)
O2 Saturation: 99 %
Potassium: 3.2 mmol/L — ABNORMAL LOW (ref 3.5–5.1)
Sodium: 144 mmol/L (ref 135–145)
TCO2: >50 mmol/L — ABNORMAL HIGH (ref 22–32)
pCO2, Ven: 92.7 mmHg (ref 44.0–60.0)
pH, Ven: 7.366 (ref 7.250–7.430)
pO2, Ven: 164 mmHg — ABNORMAL HIGH (ref 32.0–45.0)

## 2021-02-07 LAB — COMPREHENSIVE METABOLIC PANEL
ALT: 29 U/L (ref 0–44)
AST: 29 U/L (ref 15–41)
Albumin: 3.4 g/dL — ABNORMAL LOW (ref 3.5–5.0)
Alkaline Phosphatase: 54 U/L (ref 38–126)
Anion gap: 5 (ref 5–15)
BUN: 8 mg/dL (ref 8–23)
CO2: 48 mmol/L — ABNORMAL HIGH (ref 22–32)
Calcium: 8.7 mg/dL — ABNORMAL LOW (ref 8.9–10.3)
Chloride: 92 mmol/L — ABNORMAL LOW (ref 98–111)
Creatinine, Ser: 0.87 mg/dL (ref 0.61–1.24)
GFR, Estimated: >60 mL/min (ref >60)
Glucose, Bld: 127 mg/dL — ABNORMAL HIGH (ref 70–99)
Potassium: 3 mmol/L — ABNORMAL LOW (ref 3.5–5.1)
Sodium: 145 mmol/L (ref 135–145)
Total Bilirubin: 0.6 mg/dL (ref 0.3–1.2)
Total Protein: 6.1 g/dL — ABNORMAL LOW (ref 6.5–8.1)

## 2021-02-07 LAB — TROPONIN I (HIGH SENSITIVITY)
Troponin I (High Sensitivity): 62 ng/L — ABNORMAL HIGH (ref <18)
Troponin I (High Sensitivity): 58 ng/L — ABNORMAL HIGH (ref <18)

## 2021-02-07 MED ORDER — FLUTICASONE PROPIONATE 50 MCG/ACT NA SUSP
1.0000 | Freq: Every day | NASAL | Status: DC
  Administered 2021-02-08 – 2021-02-11 (×4): 1 via NASAL
  Filled 2021-02-07: qty 16

## 2021-02-07 MED ORDER — ALBUTEROL SULFATE HFA 108 (90 BASE) MCG/ACT IN AERS: 2.0000 | INHALATION_SPRAY | Freq: Four times a day (QID) | RESPIRATORY_TRACT | Status: DC | PRN

## 2021-02-07 MED ORDER — SODIUM CHLORIDE 0.9% FLUSH
3.0000 mL | Freq: Two times a day (BID) | INTRAVENOUS | Status: DC
  Administered 2021-02-08 – 2021-02-10 (×7): 3 mL via INTRAVENOUS

## 2021-02-07 MED ORDER — POTASSIUM CHLORIDE CRYS ER 20 MEQ PO TBCR
60.0000 meq | EXTENDED_RELEASE_TABLET | Freq: Once | ORAL | Status: AC
  Administered 2021-02-08: 60 meq via ORAL
  Filled 2021-02-07: qty 3

## 2021-02-07 MED ORDER — BUDESON-GLYCOPYRROL-FORMOTEROL 160-9-4.8 MCG/ACT IN AERO: 2.0000 | INHALATION_SPRAY | Freq: Two times a day (BID) | RESPIRATORY_TRACT | Status: DC

## 2021-02-07 MED ORDER — PREDNISONE 10 MG PO TABS: 10.0000 mg | ORAL_TABLET | Freq: Every day | ORAL | Status: DC

## 2021-02-07 MED ORDER — KETOROLAC TROMETHAMINE 15 MG/ML IJ SOLN
15.0000 mg | Freq: Once | INTRAMUSCULAR | Status: AC
  Administered 2021-02-08: 15 mg via INTRAVENOUS
  Filled 2021-02-07: qty 1

## 2021-02-07 MED ORDER — ACETAMINOPHEN 650 MG RE SUPP: 650.0000 mg | Freq: Four times a day (QID) | RECTAL | Status: DC | PRN

## 2021-02-07 MED ORDER — FERROUS SULFATE 325 (65 FE) MG PO TABS
325.0000 mg | ORAL_TABLET | Freq: Two times a day (BID) | ORAL | Status: DC
  Administered 2021-02-08 – 2021-02-11 (×7): 325 mg via ORAL
  Filled 2021-02-07 (×7): qty 1

## 2021-02-07 MED ORDER — ACETAMINOPHEN 500 MG PO TABS
1000.0000 mg | ORAL_TABLET | Freq: Once | ORAL | Status: AC
  Administered 2021-02-07: 1000 mg via ORAL
  Filled 2021-02-07: qty 2

## 2021-02-07 MED ORDER — POLYETHYLENE GLYCOL 3350 17 G PO PACK: 17.0000 g | PACK | Freq: Every day | ORAL | Status: DC | PRN

## 2021-02-07 MED ORDER — UMECLIDINIUM BROMIDE 62.5 MCG/INH IN AEPB
1.0000 | INHALATION_SPRAY | Freq: Every day | RESPIRATORY_TRACT | Status: DC
  Administered 2021-02-08 – 2021-02-10 (×3): 1 via RESPIRATORY_TRACT
  Filled 2021-02-07 (×2): qty 7

## 2021-02-07 MED ORDER — FLUTICASONE FUROATE-VILANTEROL 200-25 MCG/INH IN AEPB
1.0000 | INHALATION_SPRAY | Freq: Every day | RESPIRATORY_TRACT | Status: DC
  Administered 2021-02-08 – 2021-02-11 (×4): 1 via RESPIRATORY_TRACT
  Filled 2021-02-07 (×2): qty 28

## 2021-02-07 MED ORDER — FUROSEMIDE 10 MG/ML IJ SOLN
40.0000 mg | Freq: Once | INTRAMUSCULAR | Status: AC
  Administered 2021-02-08: 40 mg via INTRAVENOUS
  Filled 2021-02-07: qty 4

## 2021-02-07 MED ORDER — ATORVASTATIN CALCIUM 80 MG PO TABS
80.0000 mg | ORAL_TABLET | Freq: Every day | ORAL | Status: DC
  Administered 2021-02-08 – 2021-02-11 (×4): 80 mg via ORAL
  Filled 2021-02-07: qty 2
  Filled 2021-02-07 (×3): qty 1

## 2021-02-07 MED ORDER — ALBUTEROL SULFATE (2.5 MG/3ML) 0.083% IN NEBU: 2.5000 mg | INHALATION_SOLUTION | RESPIRATORY_TRACT | Status: DC | PRN

## 2021-02-07 MED ORDER — GABAPENTIN 300 MG PO CAPS
300.0000 mg | ORAL_CAPSULE | Freq: Every day | ORAL | Status: DC
  Administered 2021-02-08 – 2021-02-10 (×4): 300 mg via ORAL
  Filled 2021-02-07 (×4): qty 1

## 2021-02-07 MED ORDER — PREDNISONE 20 MG PO TABS
30.0000 mg | ORAL_TABLET | Freq: Every day | ORAL | Status: DC
Start: 2021-02-08 — End: 2021-02-07

## 2021-02-07 MED ORDER — FUROSEMIDE 20 MG PO TABS: 40.0000 mg | ORAL_TABLET | Freq: Every day | ORAL | Status: DC

## 2021-02-07 MED ORDER — RIVAROXABAN 20 MG PO TABS
20.0000 mg | ORAL_TABLET | Freq: Every day | ORAL | Status: DC
  Administered 2021-02-08 – 2021-02-10 (×3): 20 mg via ORAL
  Filled 2021-02-07 (×4): qty 1

## 2021-02-07 MED ORDER — PREDNISONE 20 MG PO TABS
20.0000 mg | ORAL_TABLET | Freq: Every day | ORAL | Status: AC
  Administered 2021-02-09 – 2021-02-11 (×3): 20 mg via ORAL
  Filled 2021-02-07 (×3): qty 1

## 2021-02-07 MED ORDER — NITROGLYCERIN 0.4 MG SL SUBL: 0.4000 mg | SUBLINGUAL_TABLET | SUBLINGUAL | Status: DC | PRN

## 2021-02-07 MED ORDER — ACETAMINOPHEN 325 MG PO TABS
650.0000 mg | ORAL_TABLET | Freq: Four times a day (QID) | ORAL | Status: DC | PRN
  Filled 2021-02-07: qty 2

## 2021-02-07 MED ORDER — PREDNISONE 20 MG PO TABS
30.0000 mg | ORAL_TABLET | Freq: Every day | ORAL | Status: AC
  Administered 2021-02-08: 30 mg via ORAL
  Filled 2021-02-07: qty 2

## 2021-02-07 MED ORDER — ADULT MULTIVITAMIN W/MINERALS CH
1.0000 | ORAL_TABLET | Freq: Every day | ORAL | Status: DC
  Administered 2021-02-08 – 2021-02-11 (×4): 1 via ORAL
  Filled 2021-02-07 (×4): qty 1

## 2021-02-07 NOTE — Progress Notes (Signed)
Orthopedic Tech Progress Note Patient Details:  Jared [name] February 06, 1956 [id number]  Ortho Devices Type of Ortho Device: Post (short leg) splint Ortho Device/Splint Location: Right leg Ortho Device/Splint Interventions: Application   Post Interventions Patient Tolerated: Well  [name] 02/07/2021, 8:30 PM

## 2021-02-07 NOTE — H&P (Signed)
History and Physical   Jared [name] OMA:[id number] DOB: [birth year] DOA: 02/07/2021  PCP: [name], MD   Patient coming from: Home  Chief Complaint: Syncope  HPI: Jared [name] is a 65 y.o. male with medical history significant of GI bleed, anemia, CHF, a flutter, CAD, COPD, hypertension presented after episode of syncope at home.  Patient recently admitted for COPD and CHF and is currently on a long steroid taper.  He has remained short of breath since returning home however it feels to be at his baseline.  He is continued on his home 4 L of oxygen and has been using his inhalers and taking his steroid taper.  He does report new worsening of edema for the past 2 days as well.  He primarily presents due to episode of syncope occurring earlier today where he was in the kitchen and then became lightheaded and weak and syncopized and woke on the floor.  Had pain in his right ankle after the fall and called EMS.  EMS noted some tachypnea and gave an albuterol neb in route.  He states he has had a prior syncopal event but not sure what the cause was.  He states his weight has been stable with only about 1 pound weight gain.  He has some wheezing but is chronic.  He denies fevers, chills, chest pain, abdominal pain, constipation, diarrhea, nausea, vomiting.  ED Course: Vital signs in the ED significant for remaining on 4 L of oxygen however this is chronic for him.  Lab work-up showed CMP with potassium of 3, chloride of 92, bicarb of 48 which is near his baseline of 42, glucose 127, calcium 8.7, protein 6.1, albumin 3.4.  CBC with hemoglobin stable at 12.  Troponin flat at 62 and 58 on repeat, BNP elevated at 659 however this is lower than previous elevation, VBG with pH of 7.36 and PCO2 of 92 this appears to be around his baseline as well.  Chest x-ray showed no acute normality.  Right ankle x-ray showed a nondisplaced distal fibular fracture.  Patient had immobilizer of the ankle placed plan will be  for Ortho follow-up with initial period of nonweightbearing.  He also received a dose of Lasix and Tylenol in the ED.  Review of Systems: As per HPI otherwise all other systems reviewed and are negative.  Past Medical History:  Diagnosis Date   AVM (arteriovenous malformation)    CAD (coronary artery disease)    a. LHC 5/12:  LAD 20, pLCx 20, pRCA 40, dRCA 40, EF 35%, diff HK  //  b. Myoview 4/16: Overall Impression:  High risk stress nuclear study There is no evidence of ischemia.  There is severe LV dysfunction. LV Ejection Fraction: 30%.  LV Wall Motion:  There is global LV hypokinesis.     CAP (community acquired pneumonia) 09/2013   Chronic combined systolic and diastolic CHF (congestive heart failure) (HCC)    a. Echo 4/16:Mild LVH, EF 40-45%, diffuse HK //  b. Echo 8/17: EF 35-40%, diffuse HK, diastolic dysfunction, aortic sclerosis, trivial MR, moderate LAE, normal RVSF, moderate RAE, mild TR, PASP 42 mmHg // c. Echo 4/18: Mild concentric LVH, EF 30-35, normal wall motion, grade 1 diastolic dysfunction, PASP 49   Chronic respiratory failure (HCC)    Cluster headache    "hx; haven't had one in awhile" (01/09/2016)   COPD (chronic obstructive pulmonary disease) (HCC)    [name] 01/09/2016   History of CVA (cerebrovascular accident)    Hypertension  IDA (iron deficiency anemia)    Moderate tobacco use disorder    NICM (nonischemic cardiomyopathy) (HCC)    Nicotine addiction    Tobacco abuse     Past Surgical History:  Procedure Laterality Date   BIOPSY  11/12/2020   Procedure: BIOPSY;  Surgeon: Lemar Lofty., MD;  Location: [facility] ENDOSCOPY;  Service: Gastroenterology;;   CARDIAC CATHETERIZATION  10/2010   LM normal, LAD with 20% irregularities, LCX with 20%, RCA with 40% prox and 40% distal - EF of 35%   CATARACT EXTRACTION, BILATERAL     COLONOSCOPY W/ BIOPSIES AND POLYPECTOMY     COLONOSCOPY WITH PROPOFOL N/A 09/06/2018   Procedure: COLONOSCOPY WITH PROPOFOL;  Surgeon:  Tressia Danas, MD;  Location: [facility] ENDOSCOPY;  Service: Gastroenterology;  Laterality: N/A;   ENTEROSCOPY N/A 09/28/2018   Procedure: ENTEROSCOPY;  Surgeon: Jeani Hawking, MD;  Location: [facility] ENDOSCOPY;  Service: Endoscopy;  Laterality: N/A;   ENTEROSCOPY N/A 10/28/2018   Procedure: ENTEROSCOPY;  Surgeon: Tressia Danas, MD;  Location: [facility] ENDOSCOPY;  Service: Gastroenterology;  Laterality: N/A;   ENTEROSCOPY N/A 10/09/2020   Procedure: ENTEROSCOPY;  Surgeon: Hilarie Fredrickson, MD;  Location: [facility] ENDOSCOPY;  Service: Endoscopy;  Laterality: N/A;   ESOPHAGOGASTRODUODENOSCOPY N/A 11/12/2020   Procedure: ESOPHAGOGASTRODUODENOSCOPY (EGD);  Surgeon: Lemar Lofty., MD;  Location: [facility] ENDOSCOPY;  Service: Gastroenterology;  Laterality: N/A;   ESOPHAGOGASTRODUODENOSCOPY (EGD) WITH PROPOFOL N/A 09/05/2018   Procedure: ESOPHAGOGASTRODUODENOSCOPY (EGD) WITH PROPOFOL;  Surgeon: Benancio Deeds, MD;  Location: [facility] ENDOSCOPY;  Service: Gastroenterology;  Laterality: N/A;   ESOPHAGOGASTRODUODENOSCOPY (EGD) WITH PROPOFOL N/A 11/19/2020   Procedure: ESOPHAGOGASTRODUODENOSCOPY (EGD) WITH PROPOFOL;  Surgeon: Beverley Fiedler, MD;  Location: [facility] ENDOSCOPY;  Service: Gastroenterology;  Laterality: N/A;   EXCISION MASS HEAD     GIVENS CAPSULE STUDY N/A 09/06/2018   Procedure: GIVENS CAPSULE STUDY;  Surgeon: Tressia Danas, MD;  Location: [facility] ENDOSCOPY;  Service: Gastroenterology;  Laterality: N/A;   GIVENS CAPSULE STUDY N/A 09/26/2018   Procedure: GIVENS CAPSULE STUDY;  Surgeon: Beverley Fiedler, MD;  Location: [facility] ENDOSCOPY;  Service: Gastroenterology;  Laterality: N/A;   GIVENS CAPSULE STUDY N/A 06/14/2019   Procedure: GIVENS CAPSULE STUDY;  Surgeon: Napoleon Form, MD;  Location: [facility] ENDOSCOPY;  Service: Endoscopy;  Laterality: N/A;   HEMOSTASIS CLIP PLACEMENT  11/12/2020   Procedure: HEMOSTASIS CLIP PLACEMENT;  Surgeon: Lemar Lofty., MD;  Location: [facility] ENDOSCOPY;  Service: Gastroenterology;;   HEMOSTASIS  CONTROL  11/12/2020   Procedure: HEMOSTASIS CONTROL;  Surgeon: Lemar Lofty., MD;  Location: [facility] ENDOSCOPY;  Service: Gastroenterology;;   HOT HEMOSTASIS N/A 10/28/2018   Procedure: HOT HEMOSTASIS (ARGON PLASMA COAGULATION/BICAP);  Surgeon: Tressia Danas, MD;  Location: [facility] ENDOSCOPY;  Service: Gastroenterology;  Laterality: N/A;   HOT HEMOSTASIS N/A 11/12/2020   Procedure: HOT HEMOSTASIS (ARGON PLASMA COAGULATION/BICAP);  Surgeon: Lemar Lofty., MD;  Location: [facility] ENDOSCOPY;  Service: Gastroenterology;  Laterality: N/A;   INCISION AND DRAINAGE PERIRECTAL ABSCESS N/A 06/05/2017   Procedure: IRRIGATION AND DEBRIDEMENT PERIRECTAL ABSCESS;  Surgeon: Andria Meuse, MD;  Location: [facility] OR;  Service: General;  Laterality: N/A;   SUBMUCOSAL TATTOO INJECTION  11/12/2020   Procedure: SUBMUCOSAL TATTOO INJECTION;  Surgeon: Lemar Lofty., MD;  Location: [facility] ENDOSCOPY;  Service: Gastroenterology;;   VIDEO BRONCHOSCOPY Bilateral 05/08/2016   Procedure: VIDEO BRONCHOSCOPY WITH FLUORO;  Surgeon: Oretha Milch, MD;  Location: [facility] ENDOSCOPY;  Service: Cardiopulmonary;  Laterality: Bilateral;    Social History  reports that he has been smoking cigarettes. He  has a 94.00 pack-year smoking history. He has never used smokeless tobacco. He reports that he does not currently use alcohol. He reports that he does not use drugs.  Allergies  Allergen Reactions   Lisinopril Cough    Family History  Problem Relation Age of Onset   Heart disease Mother    Diabetes Mother    Colon cancer Mother    Liver cancer Mother    Cancer Father        type unknown   Diabetes Sister        x 2   Diabetes Brother   Reviewed on admission  Prior to Admission medications   Medication Sig Start Date End Date Taking? Authorizing Provider  albuterol (PROVENTIL) (2.5 MG/3ML) 0.083% nebulizer solution Take 3 mLs (2.5 mg total) by nebulization every 4 (four) hours as needed for wheezing or shortness of  breath. 11/01/20   Allayne Stack, DO  albuterol (VENTOLIN HFA) 108 (90 Base) MCG/ACT inhaler INHALE 2 PUFFS INTO THE LUNGS EVERY 6 (SIX) HOURS AS NEEDED FOR WHEEZING OR SHORTNESS OF BREATH. 08/15/20 08/15/21  [name], MD  atorvastatin (LIPITOR) 80 MG tablet TAKE 1 TABLET EVERY DAY Patient taking differently: Take 80 mg by mouth daily. 12/21/20   [name], MD  Budeson-Glycopyrrol-Formoterol 160-9-4.8 MCG/ACT AERO INHALE 2 PUFFS INTO THE LUNGS 2 (TWO) TIMES DAILY. 04/27/20 04/27/21  Nyoka Cowden, MD  ferrous sulfate 325 (65 FE) MG tablet Take 1 tablet (325 mg total) by mouth 2 (two) times daily with a meal. 11/16/20   Esaw Grandchild A, DO  fluticasone (FLONASE) 50 MCG/ACT nasal spray Place 1 spray into both nostrils daily. 01/11/21   [name], MD  furosemide (LASIX) 40 MG tablet TAKE 1 TABLET EVERY DAY Patient taking differently: Take 40 mg by mouth daily. 12/21/20   [name], MD  gabapentin (NEURONTIN) 300 MG capsule TAKE 1 CAPSULE (300 MG TOTAL) BY MOUTH AT BEDTIME. Patient taking differently: Take 300 mg by mouth at bedtime. 12/14/20 12/14/21  [name], MD  Misc. Devices MISC Inhale 3 L into the lungs as needed. Portable oxygen concentrator. Diagnosis COPD. 11/19/20   Albertine Grates, MD  Multiple Vitamin (MULTIVITAMIN WITH MINERALS) TABS tablet Take 1 tablet by mouth daily. 11/16/20   Pennie Banter, DO  nitroGLYCERIN (NITROSTAT) 0.4 MG SL tablet Place 1 tablet (0.4 mg total) under the tongue every 5 (five) minutes as needed for chest pain. 11/19/20 11/19/21  Albertine Grates, MD  predniSONE (DELTASONE) 10 MG tablet 4 tabs daily for 3 days 3 tabs daily for 3 days 2 tabs daily for 3 days 1 tab daily for 3 days 02/02/21   Dorcas Carrow, MD  XARELTO 20 MG TABS tablet TAKE 1 TABLET EVERY DAY WITH SUPPER Patient taking differently: Take 20 mg by mouth daily with supper. 12/21/20   [name], MD  TUDORZA PRESSAIR 400 MCG/ACT AEPB INHALE 1 PUFF INTO THE LUNGS IN THE MORNING AND AT  BEDTIME. 03/29/20 04/04/20  Nyoka Cowden, MD    Physical Exam: Vitals:   02/07/21 1957 02/07/21 2030 02/07/21 2100 02/07/21 2158  BP: 124/73 137/68 (!) 144/81 137/68  Pulse: 81 88 81 78  Resp: 16 19 17 16   Temp:      TempSrc:      SpO2: 100% 100% 100% 100%  Weight:      Height:       Physical Exam Constitutional:      General: He is not in acute distress.  Appearance: Normal appearance.  HENT:     Head: Normocephalic and atraumatic.     Mouth/Throat:     Mouth: Mucous membranes are moist.     Pharynx: Oropharynx is clear.  Eyes:     Extraocular Movements: Extraocular movements intact.     Pupils: Pupils are equal, round, and reactive to light.  Cardiovascular:     Rate and Rhythm: Normal rate and regular rhythm.     Pulses: Normal pulses.     Heart sounds: Normal heart sounds.  Pulmonary:     Effort: Pulmonary effort is normal. No respiratory distress.     Breath sounds: Normal breath sounds.     Comments: Distant breath sounds Abdominal:     General: Bowel sounds are normal. There is no distension.     Palpations: Abdomen is soft.     Tenderness: There is no abdominal tenderness.  Musculoskeletal:        General: No swelling or deformity.     Right lower leg: Edema (Mild) present.     Left lower leg: Edema (Mild) present.  Skin:    General: Skin is warm and dry.  Neurological:     General: No focal deficit present.     Mental Status: Mental status is at baseline.    Labs on Admission: I have personally reviewed following labs and imaging studies  CBC: Recent Labs  Lab 02/02/21 0348 02/07/21 1800 02/07/21 1816  WBC 9.7 8.1  --   NEUTROABS 8.1* 4.2  --   HGB 11.1* 12.0* 16.0  HCT 43.1 48.1 47.0  MCV 87.6 92.3  --   PLT 319 196  --     Basic Metabolic Panel: Recent Labs  Lab 02/02/21 0348 02/07/21 1800 02/07/21 1816  NA 140 145 144  K 4.0 3.0* 3.2*  CL 92* 92*  --   CO2 42* 48*  --   GLUCOSE 130* 127*  --   BUN 23 8  --   CREATININE  0.85 0.87  --   CALCIUM 9.3 8.7*  --   MG 2.3  --   --   PHOS 3.8  --   --     GFR: Estimated Creatinine Clearance: 98.5 mL/min (by C-G formula based on SCr of 0.87 mg/dL).  Liver Function Tests: Recent Labs  Lab 02/07/21 1800  AST 29  ALT 29  ALKPHOS 54  BILITOT 0.6  PROT 6.1*  ALBUMIN 3.4*    Urine analysis:    Component Value Date/Time   COLORURINE YELLOW 01/30/2021 1150   APPEARANCEUR CLEAR 01/30/2021 1150   LABSPEC 1.008 01/30/2021 1150   PHURINE 6.0 01/30/2021 1150   GLUCOSEU NEGATIVE 01/30/2021 1150   HGBUR NEGATIVE 01/30/2021 1150   BILIRUBINUR NEGATIVE 01/30/2021 1150   KETONESUR NEGATIVE 01/30/2021 1150   PROTEINUR NEGATIVE 01/30/2021 1150   NITRITE NEGATIVE 01/30/2021 1150   LEUKOCYTESUR NEGATIVE 01/30/2021 1150    Radiological Exams on Admission: DG Ankle Complete Right  Result Date: 02/07/2021 CLINICAL DATA:  Ankle pain after syncope EXAM: RIGHT ANKLE - COMPLETE 3+ VIEW COMPARISON:  None. FINDINGS: Oblique nondisplaced distal fibular fracture. Ankle mortise is otherwise preserved. The base of the fifth metatarsal is unremarkable. Mild diffuse soft tissue swelling, more prominent laterally. IMPRESSION: Oblique nondisplaced distal fibular fracture. Electronically Signed   By: Charline Bills M.D.   On: 02/07/2021 19:14   DG Chest Port 1 View  Result Date: 02/07/2021 CLINICAL DATA:  Syncope EXAM: PORTABLE CHEST 1 VIEW COMPARISON:  Chest x-ray 01/30/2021, chest x-ray  01/18/2020 FINDINGS: Prominent cardiac silhouette. The heart and mediastinal contours are unchanged. Aortic calcification No focal consolidation. Coarsened interstitial markings with no pulmonary edema. No pleural effusion. No pneumothorax. No acute osseous abnormality. IMPRESSION: 1. No active disease. 2. Aortic Atherosclerosis (ICD10-I70.0) and Emphysema (ICD10-J43.9). Electronically Signed   By: Tish Frederickson M.D.   On: 02/07/2021 19:15    EKG: Independently reviewed.  Sinus arrhythmia with  PACs.  QTc 462.  Nonspecific T wave flattening in V1 and aVL and lead III.  Assessment/Plan Principal Problem:   Syncope and collapse Active Problems:   Essential hypertension   CAD (coronary artery disease)   COPD GOLD III/still smoking    Atrial flutter (HCC)   Iron deficiency anemia   Chronic anticoagulation   Chronic respiratory failure with hypoxia and hypercapnia (HCC)  Syncope and collapse > Episode of syncope earlier today in his kitchen preceded by lightheadedness and weakness.  Awoke on the floor.  With ankle pain. > Likely etiology is orthostatic versus vasovagal, has had some ectopy in ED however presentation is not typical of arrhythmogenic syncope.  - Monitor on telemetry - No echocardiogram as he just had 1 last week - Replete electrolytes as below - Orthostatic vital signs  Right ankle fracture > After syncopal event noted to have right ankle pain found to have nondisplaced distal fibula fracture. - Nonweightbearing for now, immobilizer placed in the ED - We will need Ortho follow-up - PT and OT evaluation given his already limited reserve due to his chronic respiratory failure. - Toradol once for pain  HTN CHF, mild exacerbation? > Echocardiogram on 01/31/2021 with EF 45-50%, G2 DD, RV with mild to moderate reduced systolic function. > Has had some edema in the last couple days but only 1 pound weight gain.  Has continued to use home medications. > Received 1 dose of IV Lasix in the ED.  Due to edema and BNP elevated at 569. > Hypokalemia to 3 in the ED. - Monitor response to this initial dose of Lasix, his breathing is at baseline per report - We will plan to continue with home Lasix dose, consider additional IV dose if symptoms do not adequately respond to initial dose. - 60 mEq p.o. potassium, check magnesium - I/O, daily weights  COPD Chronic respiratory failure with hypoxia and hypercapnia > Significant COPD on 4 L of chronic home oxygen.  Remains on 4  L.  Recently discharged on steroid taper which he is taking. > VBG with normal pH and PCO2 of 97 which is around his baseline.  - Continue with home budesonide-Glycopyrrol-formoterol combination inhaler - Continue with as needed albuterol - Home oxygen  History of GI bleed Anemia > Hemoglobin stable at 12 in the ED. - Continue to monitor CBC - Continue Iron  Atrial flutter > In and out of A. fib in the ED per EDP. - Continue home Xarelto  CAD - Continue home statin  DVT prophylaxis: Xarelto Code Status:   Full  Family Communication:  None on admission, patient states he has been in contact with his family and they are up-to-date.   Disposition Plan:   Patient is from:  Home  Anticipated DC to:  Home  Anticipated DC date:  1 to 3 days  Anticipated DC barriers: None  Consults called:  None  Admission status:  Observation, telemetry  Severity of Illness: The appropriate patient status for this patient is OBSERVATION. Observation status is judged to be reasonable and necessary in order to provide the  required intensity of service to ensure the patient's safety. The patient's presenting symptoms, physical exam findings, and initial radiographic and laboratory data in the context of their medical condition is felt to place them at decreased risk for further clinical deterioration. Furthermore, it is anticipated that the patient will be medically stable for discharge from the hospital within 2 midnights of admission. The following factors support the patient status of observation.   " The patient's presenting symptoms include syncope, edema, ankle pain, chronic respiratory failure. " The physical exam findings include splinted right ankle, edema. " The initial radiographic and laboratory data are Lab work-up showed CMP with potassium of 3, chloride of 92, bicarb of 48 which is near his baseline of 42, glucose 127, calcium 8.7, protein 6.1, albumin 3.4.  CBC with hemoglobin stable at 12.   Troponin flat at 62 and 58 on repeat, BNP elevated at 659 however this is lower than previous elevation, VBG with pH of 7.36 and PCO2 of 92 this appears to be around his baseline as well.  Chest x-ray showed no acute normality.  Right ankle x-ray showed a nondisplaced distal fibular fracture.   Synetta FailAlexander B Weylin Plagge MD [facility]  How to contact the [facility] Attending or Consulting provider 7A - 7P or covering provider during after hours 7P -7A, for this patient?   Check the care team in [facility] and look for a) attending/consulting [facility] provider listed and b) the [facility] team listed Log into [email or web address] and use [place]'s universal password to access. If you do not have the password, please contact the hospital operator. Locate the [facility] provider you are looking for under [facility] and page to a number that you can be directly reached. If you still have difficulty reaching the provider, please page the [facility] (Director on Call) for the Hospitalists listed on amion for assistance.  02/07/2021, 11:03 PM

## 2021-02-07 NOTE — ED Provider Notes (Signed)
[facility] EMERGENCY DEPARTMENT Provider Note   CSN: [id number] Arrival date & time: 02/07/21  1744     History Chief Complaint  Patient presents with   Loss of Consciousness   Shortness of Breath    Tommaso Cavitt is a 65 y.o. male.  Pt is a 65y/o male with hx of CAD, CHF, history of stroke and GI bleed, paroxysmal A. fib on Xarelto, COPD on 4 L oxygen who was just d/ced from the hospital 6 days ago for fluid overload and COPD exacerbation who is currently on long steroid taper presenting today with syncope at home and SOB.  Patient reports since being out of the hospital he is remained short of breath but thinks that that is the way he is always going to feel.  He has been on his 4 L of oxygen, using his inhalers as prescribed and taking his long steroid taper.  He has been checking his weight daily and is usually only within 1 pound of his baseline but has noticed some swelling in his bilateral feet over the last 2 days.  Today he was standing in his kitchen trying to make something to eat and had been standing approximately 6 to 7 minutes when he started feeling really lightheaded and weak and reports that he had a syncopal event and woke up on the floor.  He was having significant pain in his right ankle and he crawled to the bedroom and then called 911.  He denied feeling any chest pain or palpitations prior to the event.  He continues to feel short of breath but feels that it is at his baseline.  He has not had any new productive cough, fever, abdominal pain, nausea or vomiting.  He denies any change in medication since leaving the hospital.  EMS did note the patient was significantly tachypneic and he did receive albuterol in route.  Patient reports he has had syncope before and they told him it was related to low oxygen levels.  The history is provided by the patient and medical records.  Loss of Consciousness Associated symptoms: shortness of breath   Shortness of  Breath Associated symptoms: syncope       Past Medical History:  Diagnosis Date   AVM (arteriovenous malformation)    CAD (coronary artery disease)    a. LHC 5/12:  LAD 20, pLCx 20, pRCA 40, dRCA 40, EF 35%, diff HK  //  b. Myoview 4/16: Overall Impression:  High risk stress nuclear study There is no evidence of ischemia.  There is severe LV dysfunction. LV Ejection Fraction: 30%.  LV Wall Motion:  There is global LV hypokinesis.     CAP (community acquired pneumonia) 09/2013   Chronic combined systolic and diastolic CHF (congestive heart failure) (HCC)    a. Echo 4/16:Mild LVH, EF 40-45%, diffuse HK //  b. Echo 8/17: EF 35-40%, diffuse HK, diastolic dysfunction, aortic sclerosis, trivial MR, moderate LAE, normal RVSF, moderate RAE, mild TR, PASP 42 mmHg // c. Echo 4/18: Mild concentric LVH, EF 30-35, normal wall motion, grade 1 diastolic dysfunction, PASP 49   Chronic respiratory failure (HCC)    Cluster headache    "hx; haven't had one in awhile" (01/09/2016)   COPD (chronic obstructive pulmonary disease) (HCC)    [name] 01/09/2016   History of CVA (cerebrovascular accident)    Hypertension    IDA (iron deficiency anemia)    Moderate tobacco use disorder    NICM (nonischemic cardiomyopathy) (HCC)  Nicotine addiction    Tobacco abuse     Patient Active Problem List   Diagnosis Date Noted   Elevated troponin 01/30/2021   History of GI bleed    Gastric ulcer due to Helicobacter pylori and nonsteroidal anti-inflammatory drug (NSAID)    AKI (acute kidney injury) (HCC) 11/18/2020   Epigastric pain    Acute gastric ulcer with hemorrhage    Acute blood loss anemia    Acute upper GI bleed 11/12/2020   Hematemesis with nausea    Acute GI bleeding 06/11/2019   Syncope 06/11/2019   Chronic right maxillary sinusitis 01/29/2019   Chronic respiratory failure with hypoxia and hypercapnia (HCC) 01/01/2019   Acute on chronic respiratory failure with hypoxia and hypercapnia (HCC) 11/10/2018    GI bleed 10/27/2018   Chronic anticoagulation 09/27/2018   AV malformation of gastrointestinal tract    Occult GI bleeding    Heme positive stool    Iron deficiency anemia    Acute on chronic anemia 09/04/2018   Noncompliance 06/14/2017   Elevated LFTs 08/13/2016   Chronic respiratory failure with hypoxia (HCC) 07/24/2016   Atrial flutter (HCC)    Hepatic congestion 07/02/2016   Acute on chronic combined systolic and diastolic CHF (congestive heart failure) (HCC) 07/01/2016   COPD with acute exacerbation (HCC) 06/03/2016   Prediabetes 05/14/2016   Hemoptysis 05/06/2016   COPD GOLD III/still smoking  02/29/2016   Cigarette smoker 01/09/2016   CAD (coronary artery disease) 10/07/2013   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   Centrilobular emphysema (HCC) 10/04/2013   Essential hypertension     Past Surgical History:  Procedure Laterality Date   BIOPSY  11/12/2020   Procedure: BIOPSY;  Surgeon: Lemar Lofty., MD;  Location: [facility] ENDOSCOPY;  Service: Gastroenterology;;   CARDIAC CATHETERIZATION  10/2010   LM normal, LAD with 20% irregularities, LCX with 20%, RCA with 40% prox and 40% distal - EF of 35%   CATARACT EXTRACTION, BILATERAL     COLONOSCOPY W/ BIOPSIES AND POLYPECTOMY     COLONOSCOPY WITH PROPOFOL N/A 09/06/2018   Procedure: COLONOSCOPY WITH PROPOFOL;  Surgeon: Tressia Danas, MD;  Location: [facility] ENDOSCOPY;  Service: Gastroenterology;  Laterality: N/A;   ENTEROSCOPY N/A 09/28/2018   Procedure: ENTEROSCOPY;  Surgeon: Jeani Hawking, MD;  Location: [facility] ENDOSCOPY;  Service: Endoscopy;  Laterality: N/A;   ENTEROSCOPY N/A 10/28/2018   Procedure: ENTEROSCOPY;  Surgeon: Tressia Danas, MD;  Location: [facility] ENDOSCOPY;  Service: Gastroenterology;  Laterality: N/A;   ENTEROSCOPY N/A 10/09/2020   Procedure: ENTEROSCOPY;  Surgeon: Hilarie Fredrickson, MD;  Location: [facility] ENDOSCOPY;  Service: Endoscopy;  Laterality: N/A;   ESOPHAGOGASTRODUODENOSCOPY N/A 11/12/2020   Procedure:  ESOPHAGOGASTRODUODENOSCOPY (EGD);  Surgeon: Lemar Lofty., MD;  Location: [facility] ENDOSCOPY;  Service: Gastroenterology;  Laterality: N/A;   ESOPHAGOGASTRODUODENOSCOPY (EGD) WITH PROPOFOL N/A 09/05/2018   Procedure: ESOPHAGOGASTRODUODENOSCOPY (EGD) WITH PROPOFOL;  Surgeon: Benancio Deeds, MD;  Location: [facility] ENDOSCOPY;  Service: Gastroenterology;  Laterality: N/A;   ESOPHAGOGASTRODUODENOSCOPY (EGD) WITH PROPOFOL N/A 11/19/2020   Procedure: ESOPHAGOGASTRODUODENOSCOPY (EGD) WITH PROPOFOL;  Surgeon: Beverley Fiedler, MD;  Location: [facility] ENDOSCOPY;  Service: Gastroenterology;  Laterality: N/A;   EXCISION MASS HEAD     GIVENS CAPSULE STUDY N/A 09/06/2018   Procedure: GIVENS CAPSULE STUDY;  Surgeon: Tressia Danas, MD;  Location: [facility] ENDOSCOPY;  Service: Gastroenterology;  Laterality: N/A;   GIVENS CAPSULE STUDY N/A 09/26/2018   Procedure: GIVENS CAPSULE STUDY;  Surgeon: Beverley Fiedler, MD;  Location: [facility] ENDOSCOPY;  Service: Gastroenterology;  Laterality: N/A;  GIVENS CAPSULE STUDY N/A 06/14/2019   Procedure: GIVENS CAPSULE STUDY;  Surgeon: Napoleon Form, MD;  Location: [facility] ENDOSCOPY;  Service: Endoscopy;  Laterality: N/A;   HEMOSTASIS CLIP PLACEMENT  11/12/2020   Procedure: HEMOSTASIS CLIP PLACEMENT;  Surgeon: Lemar Lofty., MD;  Location: [facility] ENDOSCOPY;  Service: Gastroenterology;;   HEMOSTASIS CONTROL  11/12/2020   Procedure: HEMOSTASIS CONTROL;  Surgeon: Lemar Lofty., MD;  Location: [facility] ENDOSCOPY;  Service: Gastroenterology;;   HOT HEMOSTASIS N/A 10/28/2018   Procedure: HOT HEMOSTASIS (ARGON PLASMA COAGULATION/BICAP);  Surgeon: Tressia Danas, MD;  Location: [facility] ENDOSCOPY;  Service: Gastroenterology;  Laterality: N/A;   HOT HEMOSTASIS N/A 11/12/2020   Procedure: HOT HEMOSTASIS (ARGON PLASMA COAGULATION/BICAP);  Surgeon: Lemar Lofty., MD;  Location: [facility] ENDOSCOPY;  Service: Gastroenterology;  Laterality: N/A;   INCISION AND DRAINAGE PERIRECTAL ABSCESS N/A 06/05/2017    Procedure: IRRIGATION AND DEBRIDEMENT PERIRECTAL ABSCESS;  Surgeon: Andria Meuse, MD;  Location: [facility] OR;  Service: General;  Laterality: N/A;   SUBMUCOSAL TATTOO INJECTION  11/12/2020   Procedure: SUBMUCOSAL TATTOO INJECTION;  Surgeon: Lemar Lofty., MD;  Location: [facility] ENDOSCOPY;  Service: Gastroenterology;;   VIDEO BRONCHOSCOPY Bilateral 05/08/2016   Procedure: VIDEO BRONCHOSCOPY WITH FLUORO;  Surgeon: Oretha Milch, MD;  Location: [facility] ENDOSCOPY;  Service: Cardiopulmonary;  Laterality: Bilateral;       Family History  Problem Relation Age of Onset   Heart disease Mother    Diabetes Mother    Colon cancer Mother    Liver cancer Mother    Cancer Father        type unknown   Diabetes Sister        x 2   Diabetes Brother     Social History   Tobacco Use   Smoking status: Some Days    Packs/day: 2.00    Years: 47.00    Pack years: 94.00    Types: Cigarettes   Smokeless tobacco: Never  Vaping Use   Vaping Use: Never used  Substance Use Topics   Alcohol use: Not Currently    Alcohol/week: 0.0 standard drinks    Comment: last drink was before xmas   Drug use: No    Types: Cocaine, Marijuana    Comment: "nothing in 20 years"    Home Medications Prior to Admission medications   Medication Sig Start Date End Date Taking? Authorizing Provider  albuterol (PROVENTIL) (2.5 MG/3ML) 0.083% nebulizer solution Take 3 mLs (2.5 mg total) by nebulization every 4 (four) hours as needed for wheezing or shortness of breath. 11/01/20   Allayne Stack, DO  albuterol (VENTOLIN HFA) 108 (90 Base) MCG/ACT inhaler INHALE 2 PUFFS INTO THE LUNGS EVERY 6 (SIX) HOURS AS NEEDED FOR WHEEZING OR SHORTNESS OF BREATH. 08/15/20 08/15/21  [name], MD  atorvastatin (LIPITOR) 80 MG tablet TAKE 1 TABLET EVERY DAY Patient taking differently: Take 80 mg by mouth daily. 12/21/20   [name], MD  Budeson-Glycopyrrol-Formoterol 160-9-4.8 MCG/ACT AERO INHALE 2 PUFFS INTO THE LUNGS 2 (TWO)  TIMES DAILY. 04/27/20 04/27/21  Nyoka Cowden, MD  ferrous sulfate 325 (65 FE) MG tablet Take 1 tablet (325 mg total) by mouth 2 (two) times daily with a meal. 11/16/20   Esaw Grandchild A, DO  fluticasone (FLONASE) 50 MCG/ACT nasal spray Place 1 spray into both nostrils daily. 01/11/21   [name], MD  furosemide (LASIX) 40 MG tablet TAKE 1 TABLET EVERY DAY Patient taking differently: Take 40 mg by mouth daily. 12/21/20   Newlin, Odette Horns,  MD  gabapentin (NEURONTIN) 300 MG capsule TAKE 1 CAPSULE (300 MG TOTAL) BY MOUTH AT BEDTIME. Patient taking differently: Take 300 mg by mouth at bedtime. 12/14/20 12/14/21  [name], MD  Misc. Devices MISC Inhale 3 L into the lungs as needed. Portable oxygen concentrator. Diagnosis COPD. 11/19/20   Albertine Grates, MD  Multiple Vitamin (MULTIVITAMIN WITH MINERALS) TABS tablet Take 1 tablet by mouth daily. 11/16/20   Pennie Banter, DO  nitroGLYCERIN (NITROSTAT) 0.4 MG SL tablet Place 1 tablet (0.4 mg total) under the tongue every 5 (five) minutes as needed for chest pain. 11/19/20 11/19/21  Albertine Grates, MD  predniSONE (DELTASONE) 10 MG tablet 4 tabs daily for 3 days 3 tabs daily for 3 days 2 tabs daily for 3 days 1 tab daily for 3 days 02/02/21   Dorcas Carrow, MD  XARELTO 20 MG TABS tablet TAKE 1 TABLET EVERY DAY WITH SUPPER Patient taking differently: Take 20 mg by mouth daily with supper. 12/21/20   [name], MD  TUDORZA PRESSAIR 400 MCG/ACT AEPB INHALE 1 PUFF INTO THE LUNGS IN THE MORNING AND AT BEDTIME. 03/29/20 04/04/20  Nyoka Cowden, MD    Allergies    Lisinopril  Review of Systems   Review of Systems  Respiratory:  Positive for shortness of breath.   Cardiovascular:  Positive for syncope.  All other systems reviewed and are negative.  Physical Exam Updated Vital Signs BP 137/68   Pulse 78   Temp 98.2 F (36.8 C) (Oral)   Resp 16   Ht 6\' 3"  (1.905 m)   Wt 100 kg   SpO2 100%   BMI 27.56 kg/m   Physical Exam Vitals and nursing note  reviewed.  Constitutional:      General: He is not in acute distress.    Appearance: He is well-developed. He is ill-appearing.  HENT:     Head: Normocephalic and atraumatic.  Eyes:     Conjunctiva/sclera: Conjunctivae normal.     Pupils: Pupils are equal, round, and reactive to light.  Cardiovascular:     Rate and Rhythm: Tachycardia present. Rhythm irregularly irregular. FrequentExtrasystoles are present.    Pulses: Normal pulses.     Heart sounds: No murmur heard. Pulmonary:     Effort: Pulmonary effort is normal. Tachypnea present. No respiratory distress.     Breath sounds: Examination of the right-lower field reveals decreased breath sounds. Examination of the left-lower field reveals decreased breath sounds. Decreased breath sounds and wheezing present. No rales.     Comments: Pursed lip breathing and speaks in short sentences.  Expiratory wheezes throughout Abdominal:     General: There is no distension.     Palpations: Abdomen is soft.     Tenderness: There is no abdominal tenderness. There is no guarding or rebound.  Musculoskeletal:     Cervical back: Normal range of motion and neck supple.     Right lower leg: Edema present.     Left lower leg: Edema present.     Right ankle: Swelling present. No ecchymosis. Tenderness present over the lateral malleolus and medial malleolus. No posterior TF ligament or proximal fibula tenderness. Decreased range of motion. Normal pulse.     Comments: 1+ edema in the ankles bilaterally  Skin:    General: Skin is warm and dry.     Findings: No erythema or rash.  Neurological:     Mental Status: He is alert and oriented to person, place, and time. Mental status is at baseline.  Psychiatric:        Mood and Affect: Mood normal.        Behavior: Behavior normal.    ED Results / Procedures / Treatments   Labs (all labs ordered are listed, but only abnormal results are displayed) Labs Reviewed  CBC WITH DIFFERENTIAL/PLATELET - Abnormal;  Notable for the following components:      Result Value   Hemoglobin 12.0 (*)    MCH 23.0 (*)    MCHC 24.9 (*)    RDW 16.7 (*)    All other components within normal limits  COMPREHENSIVE METABOLIC PANEL - Abnormal; Notable for the following components:   Potassium 3.0 (*)    Chloride 92 (*)    CO2 48 (*)    Glucose, Bld 127 (*)    Calcium 8.7 (*)    Total Protein 6.1 (*)    Albumin 3.4 (*)    All other components within normal limits  BRAIN NATRIURETIC PEPTIDE - Abnormal; Notable for the following components:   B Natriuretic Peptide 659.2 (*)    All other components within normal limits  I-STAT VENOUS BLOOD GAS, ED - Abnormal; Notable for the following components:   pCO2, Ven 92.7 (*)    pO2, Ven 164.0 (*)    Bicarbonate 53.1 (*)    TCO2 >50 (*)    Acid-Base Excess 21.0 (*)    Potassium 3.2 (*)    Calcium, Ion 1.10 (*)    All other components within normal limits  TROPONIN I (HIGH SENSITIVITY) - Abnormal; Notable for the following components:   Troponin I (High Sensitivity) 62 (*)    All other components within normal limits  TROPONIN I (HIGH SENSITIVITY) - Abnormal; Notable for the following components:   Troponin I (High Sensitivity) 58 (*)    All other components within normal limits    EKG EKG Interpretation  Date/Time:  Tuesday February 07 2021 17:54:55 EDT Ventricular Rate:  92 PR Interval:  122 QRS Duration: 93 QT Interval:  373 QTC Calculation: 462 R Axis:   -89 Text Interpretation: Sinus arrhythmia Premature atrial complexes Inferior infarct, old Prolonged QT RESOLVED SINCE PREVIOUS Confirmed by Gwyneth SproutPlunkett, Cesario Weidinger ([id number]) on 02/07/2021 6:12:18 PM  Radiology DG Ankle Complete Right  Result Date: 02/07/2021 CLINICAL DATA:  Ankle pain after syncope EXAM: RIGHT ANKLE - COMPLETE 3+ VIEW COMPARISON:  None. FINDINGS: Oblique nondisplaced distal fibular fracture. Ankle mortise is otherwise preserved. The base of the fifth metatarsal is unremarkable. Mild diffuse soft  tissue swelling, more prominent laterally. IMPRESSION: Oblique nondisplaced distal fibular fracture. Electronically Signed   By: Charline BillsSriyesh  Krishnan M.D.   On: 02/07/2021 19:14   DG Chest Port 1 View  Result Date: 02/07/2021 CLINICAL DATA:  Syncope EXAM: PORTABLE CHEST 1 VIEW COMPARISON:  Chest x-ray 01/30/2021, chest x-ray 01/18/2020 FINDINGS: Prominent cardiac silhouette. The heart and mediastinal contours are unchanged. Aortic calcification No focal consolidation. Coarsened interstitial markings with no pulmonary edema. No pleural effusion. No pneumothorax. No acute osseous abnormality. IMPRESSION: 1. No active disease. 2. Aortic Atherosclerosis (ICD10-I70.0) and Emphysema (ICD10-J43.9). Electronically Signed   By: Tish FredericksonMorgane  Naveau M.D.   On: 02/07/2021 19:15    Procedures Procedures   Medications Ordered in ED Medications  furosemide (LASIX) injection 40 mg (has no administration in time range)  acetaminophen (TYLENOL) tablet 1,000 mg (1,000 mg Oral Given 02/07/21 2002)    ED Course  I have reviewed the triage vital signs and the nursing notes.  Pertinent labs & imaging results that were available during  my care of the patient were reviewed by me and considered in my medical decision making (see chart for details).    MDM Rules/Calculators/A&P                           Elderly male with multiple medical problems presenting today with syncope.  Patient has significant history for COPD on 4 L of oxygen daily and CHF.  Patient is wheezing on exam and tachypneic which he reports is his baseline.  Also having new swelling in the lower extremities with pound weight gain despite taking his oral medication.  Patient denied any chest pain or palpitations prior to the event but is intermittently going in and out of atrial fibrillation.  Patient does take Xarelto for this and low suspicion at this time for PE.  Concern for dysrhythmia, possible anemia, hypoxia, orthostasis.  Patient's blood pressure  here is normal.  He is wheezing on exam and tachypneic.  We will give a DuoNeb.  Labs and x-ray are pending.  Patient recently had an echo on 01/31/2021 which showed no evidence of aortic stenosis and an EF of 45 to 50%.  10:07 PM Patient's labs show a slightly higher troponin than baseline at 60 and 58, VBG with normal pH and CO2 of 92 which appears to be baseline with metabolic compensation, BNP is elevated at 659 but improved from prior, hemoglobin stable at 12, CMP with mild hypokalemia of 3.0 and normal creatinine, chest x-ray without acute findings.  Ankle film shows an oblique nondisplaced distal fibular fracture and patient was placed in a short leg splint and will need early immobilization and orthopedic follow-up.  However given patient's new syncope, now immobility and frequent ectopy and in and out of afib will admit for syncope work-up.  Patient was given IV Lasix because he has had new ankle swelling over the last 2 days.  Still currently on a prednisone taper and took his dose today.  MDM   Amount and/or Complexity of Data Reviewed Clinical lab tests: ordered and reviewed Tests in the radiology section of CPT: ordered and reviewed Tests in the medicine section of CPT: ordered and reviewed Decide to obtain previous medical records or to obtain history from someone other than the patient: yes Review and summarize past medical records: yes Independent visualization of images, tracings, or specimens: yes    Final Clinical Impression(s) / ED Diagnoses Final diagnoses:  Syncope, unspecified syncope type  Closed fracture of distal end of fibula, unspecified fracture morphology, initial encounter    Rx / DC Orders ED Discharge Orders     None        Gwyneth Sprout, MD 02/07/21 2210

## 2021-02-07 NOTE — ED Triage Notes (Signed)
Patient states he was in the kitchen cooking when he got real shaky and dizzy. States he might have passed out, denies hitting his head. States his breathing is normal for him. Denies any chest pain. Denies weight gain.

## 2021-02-08 ENCOUNTER — Other Ambulatory Visit: Payer: Self-pay

## 2021-02-08 ENCOUNTER — Observation Stay (HOSPITAL_COMMUNITY): Payer: Medicare HMO

## 2021-02-08 DIAGNOSIS — I110 Hypertensive heart disease with heart failure: Secondary | ICD-10-CM | POA: Diagnosis present

## 2021-02-08 DIAGNOSIS — Z79899 Other long term (current) drug therapy: Secondary | ICD-10-CM | POA: Diagnosis not present

## 2021-02-08 DIAGNOSIS — I4892 Unspecified atrial flutter: Secondary | ICD-10-CM | POA: Diagnosis present

## 2021-02-08 DIAGNOSIS — Z7951 Long term (current) use of inhaled steroids: Secondary | ICD-10-CM | POA: Diagnosis not present

## 2021-02-08 DIAGNOSIS — Z8673 Personal history of transient ischemic attack (TIA), and cerebral infarction without residual deficits: Secondary | ICD-10-CM | POA: Diagnosis not present

## 2021-02-08 DIAGNOSIS — Z888 Allergy status to other drugs, medicaments and biological substances status: Secondary | ICD-10-CM | POA: Diagnosis not present

## 2021-02-08 DIAGNOSIS — Z9981 Dependence on supplemental oxygen: Secondary | ICD-10-CM | POA: Diagnosis not present

## 2021-02-08 DIAGNOSIS — Z20822 Contact with and (suspected) exposure to covid-19: Secondary | ICD-10-CM | POA: Diagnosis present

## 2021-02-08 DIAGNOSIS — Z8249 Family history of ischemic heart disease and other diseases of the circulatory system: Secondary | ICD-10-CM | POA: Diagnosis not present

## 2021-02-08 DIAGNOSIS — J9612 Chronic respiratory failure with hypercapnia: Secondary | ICD-10-CM | POA: Diagnosis present

## 2021-02-08 DIAGNOSIS — S82831A Other fracture of upper and lower end of right fibula, initial encounter for closed fracture: Secondary | ICD-10-CM | POA: Diagnosis present

## 2021-02-08 DIAGNOSIS — R55 Syncope and collapse: Secondary | ICD-10-CM

## 2021-02-08 DIAGNOSIS — Y92000 Kitchen of unspecified non-institutional (private) residence as  the place of occurrence of the external cause: Secondary | ICD-10-CM | POA: Diagnosis not present

## 2021-02-08 DIAGNOSIS — J9611 Chronic respiratory failure with hypoxia: Secondary | ICD-10-CM | POA: Diagnosis present

## 2021-02-08 DIAGNOSIS — I2510 Atherosclerotic heart disease of native coronary artery without angina pectoris: Secondary | ICD-10-CM | POA: Diagnosis present

## 2021-02-08 DIAGNOSIS — D509 Iron deficiency anemia, unspecified: Secondary | ICD-10-CM | POA: Diagnosis present

## 2021-02-08 DIAGNOSIS — E876 Hypokalemia: Secondary | ICD-10-CM | POA: Diagnosis present

## 2021-02-08 DIAGNOSIS — I700 Atherosclerosis of aorta: Secondary | ICD-10-CM | POA: Diagnosis present

## 2021-02-08 DIAGNOSIS — J432 Centrilobular emphysema: Secondary | ICD-10-CM | POA: Diagnosis present

## 2021-02-08 DIAGNOSIS — I480 Paroxysmal atrial fibrillation: Secondary | ICD-10-CM | POA: Diagnosis present

## 2021-02-08 DIAGNOSIS — I428 Other cardiomyopathies: Secondary | ICD-10-CM | POA: Diagnosis present

## 2021-02-08 DIAGNOSIS — S82839A Other fracture of upper and lower end of unspecified fibula, initial encounter for closed fracture: Secondary | ICD-10-CM

## 2021-02-08 DIAGNOSIS — Z7901 Long term (current) use of anticoagulants: Secondary | ICD-10-CM | POA: Diagnosis not present

## 2021-02-08 DIAGNOSIS — D500 Iron deficiency anemia secondary to blood loss (chronic): Secondary | ICD-10-CM

## 2021-02-08 DIAGNOSIS — I5042 Chronic combined systolic (congestive) and diastolic (congestive) heart failure: Secondary | ICD-10-CM | POA: Diagnosis present

## 2021-02-08 DIAGNOSIS — F17210 Nicotine dependence, cigarettes, uncomplicated: Secondary | ICD-10-CM | POA: Diagnosis present

## 2021-02-08 DIAGNOSIS — I951 Orthostatic hypotension: Secondary | ICD-10-CM | POA: Diagnosis present

## 2021-02-08 DIAGNOSIS — W1839XA Other fall on same level, initial encounter: Secondary | ICD-10-CM | POA: Diagnosis present

## 2021-02-08 LAB — CBG MONITORING, ED: Glucose-Capillary: 136 mg/dL — ABNORMAL HIGH (ref 70–99)

## 2021-02-08 LAB — BASIC METABOLIC PANEL
Anion gap: 4 — ABNORMAL LOW (ref 5–15)
BUN: 11 mg/dL (ref 8–23)
CO2: 48 mmol/L — ABNORMAL HIGH (ref 22–32)
Calcium: 8.5 mg/dL — ABNORMAL LOW (ref 8.9–10.3)
Chloride: 90 mmol/L — ABNORMAL LOW (ref 98–111)
Creatinine, Ser: 0.82 mg/dL (ref 0.61–1.24)
GFR, Estimated: >60 mL/min (ref >60)
Glucose, Bld: 116 mg/dL — ABNORMAL HIGH (ref 70–99)
Potassium: 3.7 mmol/L (ref 3.5–5.1)
Sodium: 142 mmol/L (ref 135–145)

## 2021-02-08 LAB — CBC WITH DIFFERENTIAL/PLATELET
Abs Immature Granulocytes: 0.03 10*3/uL (ref 0.00–0.07)
Basophils Absolute: 0 10*3/uL (ref 0.0–0.1)
Basophils Relative: 0 %
Eosinophils Absolute: 0.4 10*3/uL (ref 0.0–0.5)
Eosinophils Relative: 5 %
HCT: 46.8 % (ref 39.0–52.0)
Hemoglobin: 11.5 g/dL — ABNORMAL LOW (ref 13.0–17.0)
Immature Granulocytes: 0 %
Lymphocytes Relative: 24 %
Lymphs Abs: 1.8 10*3/uL (ref 0.7–4.0)
MCH: 22.7 pg — ABNORMAL LOW (ref 26.0–34.0)
MCHC: 24.6 g/dL — ABNORMAL LOW (ref 30.0–36.0)
MCV: 92.5 fL (ref 80.0–100.0)
Monocytes Absolute: 0.8 10*3/uL (ref 0.1–1.0)
Monocytes Relative: 11 %
Neutro Abs: 4.4 10*3/uL (ref 1.7–7.7)
Neutrophils Relative %: 60 %
Platelets: 152 10*3/uL (ref 150–400)
RBC: 5.06 MIL/uL (ref 4.22–5.81)
RDW: 17 % — ABNORMAL HIGH (ref 11.5–15.5)
WBC: 7.4 10*3/uL (ref 4.0–10.5)
nRBC: 0 % (ref 0.0–0.2)

## 2021-02-08 LAB — RESP PANEL BY RT-PCR (FLU A&B, COVID) ARPGX2
Influenza A by PCR: NEGATIVE
Influenza B by PCR: NEGATIVE
SARS Coronavirus 2 by RT PCR: NEGATIVE

## 2021-02-08 LAB — MAGNESIUM: Magnesium: 1.8 mg/dL (ref 1.7–2.4)

## 2021-02-08 MED ORDER — SODIUM CHLORIDE 0.9 % IV BOLUS
500.0000 mL | Freq: Once | INTRAVENOUS | Status: AC
  Administered 2021-02-08: 500 mL via INTRAVENOUS

## 2021-02-08 MED ORDER — TRAMADOL HCL 50 MG PO TABS
50.0000 mg | ORAL_TABLET | Freq: Four times a day (QID) | ORAL | Status: DC | PRN
  Administered 2021-02-08 – 2021-02-09 (×5): 100 mg via ORAL
  Administered 2021-02-10 – 2021-02-11 (×2): 50 mg via ORAL
  Filled 2021-02-08: qty 1
  Filled 2021-02-08: qty 2
  Filled 2021-02-08: qty 1
  Filled 2021-02-08 (×4): qty 2

## 2021-02-08 NOTE — Evaluation (Signed)
Occupational Therapy Evaluation Patient Details Name: Jared [name] MRN: [id number] DOB: Aug 24, 1955 Today's Date: 02/08/2021    History of Present Illness Pt is a 65yo male presenting to [facility] ED On 8/30 after syncope and collapse with resultant oblique nondisplaced distal fibular fracture. Of note, pt had recent ED visit on 8/25 for hypoxia. PMH: COPD on 4LO2 at home, CHF, anemia, atrial flutter, CAD, HTN, history of CVA.   Clinical Impression   Patient admitted for the diagnosis above.  PTA he lives in an apartment in a subdivided home.  He uses O2 chronically, recently began using a RW, but was still fairly independent with ADL/IADL.  Deficits are listed below.  Currently, he is needing up to Mod A of 2 for basic mobility, and Mod A from a seated level for lower body ADL.  OT will  continue to follow in the acute setting to maximize functional status.  SNF has been recommended, as currently he would need 24 hour assist, and will not have this at home.      Follow Up Recommendations  SNF    Equipment Recommendations  Wheelchair (measurements OT);Wheelchair cushion (measurements OT)    Recommendations for Other Services       Precautions / Restrictions Precautions Precautions: Fall Precaution Comments: watch BP.  O2 99% on 4L Required Braces or Orthoses: Splint/Cast Splint/Cast: R ankle Restrictions Weight Bearing Restrictions: Yes RLE Weight Bearing: Non weight bearing      Mobility Bed Mobility Overal bed mobility: Needs Assistance Bed Mobility: Supine to Sit;Sit to Supine     Supine to sit: Min assist;+2 for safety/equipment Sit to supine: Mod assist     Patient Response: Flat affect;Cooperative  Transfers Overall transfer level: Needs assistance   Transfers: Sit to/from Stand;Stand Pivot Transfers Sit to Stand: Max assist;Mod assist Stand pivot transfers: Max assist       General transfer comment: bed to wheelchair, wheelchair to toilet and back....    Balance  Overall balance assessment: Needs assistance Sitting-balance support: Feet unsupported;No upper extremity supported Sitting balance-Leahy Scale: Good     Standing balance support: Bilateral upper extremity supported;During functional activity Standing balance-Leahy Scale: Poor                             ADL either performed or assessed with clinical judgement   ADL Overall ADL's : Needs assistance/impaired Eating/Feeding: Independent;Sitting   Grooming: Wash/dry hands;Wash/dry face;Set up;Sitting   Upper Body Bathing: Set up;Sitting   Lower Body Bathing: Moderate assistance;Sitting/lateral leans   Upper Body Dressing : Set up;Sitting   Lower Body Dressing: Moderate assistance;Sitting/lateral leans   Toilet Transfer: Maximal assistance;Regular Toilet;Stand-pivot Toilet Transfer Details (indicate cue type and reason): heavy use of grab bars Toileting- Clothing Manipulation and Hygiene: Supervision/safety;Sitting/lateral lean               Vision Patient Visual Report: No change from baseline                  Pertinent Vitals/Pain Pain Assessment: 0-10 Pain Score: 4  Pain Location: R ankle Pain Descriptors / Indicators: Constant;Discomfort Pain Intervention(s): Monitored during session     Hand Dominance Right   Extremity/Trunk Assessment Upper Extremity Assessment Upper Extremity Assessment: Generalized weakness   Lower Extremity Assessment Lower Extremity Assessment: Defer to PT evaluation RLE: Unable to fully assess due to immobilization;Unable to fully assess due to pain LLE Deficits / Details: Pt ROM WFL and good strength throughout. LLE Sensation:  WNL   Cervical / Trunk Assessment Cervical / Trunk Assessment: Normal   Communication Communication Communication: No difficulties   Cognition Arousal/Alertness: Awake/alert Behavior During Therapy: WFL for tasks assessed/performed;Flat affect Overall Cognitive Status: Within Functional  Limits for tasks assessed                                     General Comments   VVS on 4L via [place]    Exercises     Shoulder Instructions      Home Living Family/patient expects to be discharged to:: Private residence Living Arrangements: Alone Available Help at Discharge: Family;Available PRN/intermittently Type of Home: House Home Access: Stairs to enter [employer] of Steps: 4 Entrance Stairs-Rails: Right;Left Home Layout: One level     Bathroom Shower/Tub: Chief Strategy Officer: Standard Bathroom Accessibility: Yes   Home Equipment: Grab bars - tub/shower;Shower seat;Walker - 2 wheels   Additional Comments: 4L O2 at home, just received RW on friday.      Prior Functioning/Environment Level of Independence: Independent with assistive device(s)  Gait / Transfers Assistance Needed: Reduced walking ability secondary to SOB and O2 requirement. Reports he was independent with mobility ADL's / Homemaking Assistance Needed: Independent            OT Problem List: Decreased strength;Decreased activity tolerance;Impaired balance (sitting and/or standing);Pain      OT Treatment/Interventions: Energy conservation;DME and/or AE instruction;Self-care/ADL training;Balance training;Patient/family education;Therapeutic activities    OT Goals(Current goals can be found in the care plan section) Acute Rehab OT Goals Patient Stated Goal: I need to be able to move better OT Goal Formulation: With patient Time For Goal Achievement: 02/22/21 Potential to Achieve Goals: Good ADL Goals Pt Will Perform Grooming: with set-up;sitting Pt Will Perform Lower Body Bathing: with set-up;sitting/lateral leans Pt Will Perform Lower Body Dressing: with set-up;sitting/lateral leans Pt Will Transfer to Toilet: with min assist;squat pivot transfer;stand pivot transfer;regular height toilet  OT Frequency: Min 2X/week   Barriers to D/C: Decreased caregiver  support          Co-evaluation              AM-PAC OT "6 Clicks" Daily Activity     Outcome Measure Help from another person eating meals?: None Help from another person taking care of personal grooming?: None Help from another person toileting, which includes using toliet, bedpan, or urinal?: A Lot Help from another person bathing (including washing, rinsing, drying)?: A Lot Help from another person to put on and taking off regular upper body clothing?: None Help from another person to put on and taking off regular lower body clothing?: A Lot 6 Click Score: 18   End of Session Equipment Utilized During Treatment: Oxygen;Gait belt Nurse Communication: Mobility status  Activity Tolerance: Patient tolerated treatment well Patient left:    OT Visit Diagnosis: Unsteadiness on feet (R26.81);History of falling (Z91.81);Pain Pain - Right/Left: Right Pain - part of body: Ankle and joints of foot                Time: 1400-1444 OT Time Calculation (min): 44 min Charges:  OT General Charges $OT Visit: 1 Visit OT Evaluation $OT Eval Moderate Complexity: 1 Mod OT Treatments $Self Care/Home Management : 8-22 mins $Therapeutic Activity: 8-22 mins  02/08/2021  RP, OTR/L  Acute Rehabilitation Services  Office:  [phone]   Suzanna Obey 02/08/2021, 2:58 PM

## 2021-02-08 NOTE — Progress Notes (Signed)
Physical Therapy Evaluation Patient Details Name: Jared [name] MRN: [id number] DOB: 1956-01-31 Today's Date: 02/08/2021   History of Present Illness  Pt is a 65yo male presenting to [facility] ED On 8/30 after syncope and collapse with resultant oblique nondisplaced distal fibular fracture. Of note, pt had recent ED visit on 8/25 for hypoxia. PMH: COPD on 4LO2 at home, CHF, anemia, atrial flutter, CAD, HTN, history of CVA.  Clinical Impression  Pt presents with the impairments above and problems listed below. Required min assist for bed mobility and mod assist for transfer. Pt able to observe NWB status appropriately. Pt unable to take hop steps with RW. Monitored O2 and WFL throughout session on 4L with [place]. Did note BP drop with positional changes but pt asymptomatic. Recommending SNF-level therapies upon discharge to address current limitations. We will continue to follow him acutely to promote independence with functional mobility.    02/08/21 0959 02/08/21 1002 02/08/21 1005  Vital Signs  ECG Heart Rate 94 93 100  BP (!) 159/85 131/85 121/74  Patient Position (if appropriate) Lying Sitting Standing      Follow Up Recommendations SNF    Equipment Recommendations  Wheelchair cushion (measurements PT);Wheelchair (measurements PT)    Recommendations for Other Services       Precautions / Restrictions Precautions Precautions: Fall Precaution Comments: watch BP Restrictions Weight Bearing Restrictions: Yes RLE Weight Bearing: Non weight bearing      Mobility  Bed Mobility Overal bed mobility: Needs Assistance Bed Mobility: Supine to Sit;Sit to Supine     Supine to sit: Min assist;+2 for safety/equipment Sit to supine: Min assist;+2 for safety/equipment   General bed mobility comments: Pt required min assist +2, provided for LE advancement off and on the bed (especially RLE) and trunk control to sitting/supine.    Transfers Overall transfer level: Needs assistance Equipment used:  Rolling walker (2 wheeled) Transfers: Sit to/from Stand Sit to Stand: Mod assist;+2 safety/equipment         General transfer comment: Pt able to perform transfer while appropriately observing NWB status of RLE, powering up through BUE and LLE. Required mod assist for lift assist and steadying, minimal cuing for observing precautions, hand placement, and LLE placement.  Ambulation/Gait             General Gait Details: Attempted lateral hopping but pt unable to clear L foot. Very unsteady with attempts and required min to mod A to prevent LOB. Further mobility deferred as pt with increased fatigue and pain.  Stairs            Wheelchair Mobility    Modified Rankin (Stroke Patients Only)       Balance Overall balance assessment: Needs assistance Sitting-balance support: Feet unsupported;No upper extremity supported Sitting balance-Leahy Scale: Good     Standing balance support: Bilateral upper extremity supported;During functional activity Standing balance-Leahy Scale: Poor Standing balance comment: Pt reliant on BUE during static stance and external support                             Pertinent Vitals/Pain Pain Assessment: 0-10 Pain Score: 7  Pain Location: R ankle Pain Descriptors / Indicators: Constant;Discomfort Pain Intervention(s): Limited activity within patient's tolerance;Monitored during session;Repositioned    Home Living Family/patient expects to be discharged to:: Private residence Living Arrangements: Alone Available Help at Discharge: Other (Comment) (Sister in [place] will help PRN but cant stay with him) Type of Home: House (rents a room  in a home) Home Access: Stairs to enter Entrance Stairs-Rails: Doctor, general practice of Steps: 4 Home Layout: One level Home Equipment: Grab bars - tub/shower;Shower seat;Walker - 2 wheels Additional Comments: 4L O2 at home, just received RW on friday.    Prior Function Level of  Independence: Independent   Gait / Transfers Assistance Needed: Reduced walking ability secondary to SOB and O2 requirement. Reports he was independent with mobility  ADL's / Homemaking Assistance Needed: Independent        Hand Dominance   Dominant Hand: Right    Extremity/Trunk Assessment   Upper Extremity Assessment Upper Extremity Assessment: Defer to OT evaluation    Lower Extremity Assessment Lower Extremity Assessment: RLE deficits/detail;LLE deficits/detail RLE Deficits / Details: R ankle splinted. Pt able to perform SLR. RLE jerking type movements noted with knee flexion movement that pt reports happens regularly and often at night. Pt reported additional medial knee pain but was unable to localize or remember if he hit it during his fall. RLE: Unable to fully assess due to immobilization;Unable to fully assess due to pain LLE Deficits / Details: Pt ROM WFL and good strength throughout. LLE Sensation: WNL    Cervical / Trunk Assessment Cervical / Trunk Assessment: Normal  Communication   Communication: No difficulties  Cognition Arousal/Alertness: Awake/alert Behavior During Therapy: WFL for tasks assessed/performed Overall Cognitive Status: Within Functional Limits for tasks assessed                                        General Comments General comments (skin integrity, edema, etc.): Pt reports since yesterday his oxygen delivery system at home has been malfunctioning and he called someone to come fix it; no one arrived, and the malfunction is why he passed out.    Exercises Total Joint Exercises Ankle Circles/Pumps: AROM;Left;Supine;5 reps   Assessment/Plan    PT Assessment Patient needs continued PT services  PT Problem List Decreased strength;Decreased activity tolerance;Decreased balance;Decreased mobility;Cardiopulmonary status limiting activity;Decreased range of motion;Decreased knowledge of use of DME;Pain       PT Treatment  Interventions DME instruction;Gait training;Functional mobility training;Stair training;Therapeutic activities;Therapeutic exercise;Balance training;Patient/family education    PT Goals (Current goals can be found in the Care Plan section)  Acute Rehab PT Goals Patient Stated Goal: To be able to walk to fishing spots PT Goal Formulation: With patient Time For Goal Achievement: 02/22/21 Potential to Achieve Goals: Good    Frequency Min 2X/week   Barriers to discharge Inaccessible home environment      Co-evaluation               AM-PAC PT "6 Clicks" Mobility  Outcome Measure Help needed turning from your back to your side while in a flat bed without using bedrails?: A Little Help needed moving from lying on your back to sitting on the side of a flat bed without using bedrails?: A Lot Help needed moving to and from a bed to a chair (including a wheelchair)?: Total Help needed standing up from a chair using your arms (e.g., wheelchair or bedside chair)?: A Lot Help needed to walk in hospital room?: Total Help needed climbing 3-5 steps with a railing? : Total 6 Click Score: 10    End of Session Equipment Utilized During Treatment: Gait belt;Oxygen (4LO2 WFL during session) Activity Tolerance: Patient limited by pain;Patient limited by fatigue Patient left: with call bell/phone within reach;in bed (  On stretcher in Ed) Nurse Communication: Mobility status PT Visit Diagnosis: Pain;History of falling (Z91.81);Other abnormalities of gait and mobility (R26.89) Pain - Right/Left: Right Pain - part of body: Ankle and joints of foot    Time: 6283-1517 PT Time Calculation (min) (ACUTE ONLY): 24 min   Charges:   PT Evaluation $PT Eval Moderate Complexity: 1 Mod PT Treatments $Therapeutic Activity: 8-22 mins        Johnn Hai, SPT Johnn Hai 02/08/2021, 11:23 AM

## 2021-02-08 NOTE — Progress Notes (Signed)
PROGRESS NOTE    Jared [name]  CXK:[id number] DOB: [birth year] DOA: 02/07/2021 PCP: [name], MD    Chief Complaint  Patient presents with   Loss of Consciousness   Shortness of Breath    Brief Narrative:  Patient 65 year old gentleman prior history of GI bleed, anemia, CHF, atrial flutter on anticoagulation, CAD, COPD, hypertension, presented to the ED after fall felt likely to be a presyncope versus syncopal episode in which patient sustained a right ankle fracture. -Patient seen in the ED splint placed and patient was to follow-up with orthopedics in the outpatient setting however due to concern for syncopal event patient admitted for further evaluation and management.   Assessment & Plan:   Principal Problem:   Pre-syncope Active Problems:   Syncope and collapse   Essential hypertension   CAD (coronary artery disease)   COPD GOLD III/still smoking    Atrial flutter (HCC)   Iron deficiency anemia   Chronic anticoagulation   Chronic respiratory failure with hypoxia and hypercapnia (HCC)   Closed fracture of distal end of fibula, unspecified fracture morphology, initial encounter  #1 presyncope versus syncope/orthostasis. -Patient presented with a fall and initial concern for syncopal episode. -Patient denies complete syncope however describes what seems like a presyncopal episode. -Patient denies any bladder or bowel incontinence, no tongue biting.  No focal neurological deficits. -Orthostatics checked this morning positive. -CT head checked and negative for any acute abnormalities. -Normal saline 500 cc bolus given this morning, will give another 500 cc bolus x1. -Hold home regimen of oral Lasix. -TED hose. -Repeat orthostatics in the morning.  2.  Right ankle fracture -Noted after presyncopal episode. -Patient noted to have a nondisplaced distal fibular fracture. -Splint/immobilizer placed in the ED and patient to be nonweightbearing. -PT OT. -We will need  outpatient orthopedic follow-up.  3.  Hypertension/CHF -2D echo from 01/31/2021 with EF of 45 to 50%, G2 DD, RV with mild to moderate reduced systolic function. -Patient noted to have some edema the last couple of days but only 1 pound weight gain. -Status post IV Lasix in the ED due to edema and BNP elevated at 569. -Patient denies any significant shortness of breath or chest pain. -Patient noted to be orthostatic and as such we will hold home dose oral Lasix and give a fluid bolus and monitor volume status  4.  Hypokalemia -Repleted.  Potassium at 3.7.  Magnesium at 1.8.  5.  Chronic respiratory failure with hypoxia and hypercapnia/COPD -Patient with significant COPD on 4 L home O2 chronically. -Recently discharged with a steroid taper which we will continue. -Continue home budesonide -glycopyrrol-formoterol combination inhaler -Albuterol MDI. -Home O2.  6.  History of GI bleed/anemia -H&H stable.  7.  Atrial flutter -Patient noted to go in and out of A. fib in the ED per ED physician. -Currently in sinus and rate controlled. -Xarelto for anticoagulation.  8.  Coronary artery disease -Stable. -Continue statin,   DVT prophylaxis: Xarelto Code Status: Full Family Communication: Updated patient.  No family at bedside. Disposition:   Status is: Inpatient  Remains inpatient appropriate because:Inpatient level of care appropriate due to severity of illness  Dispo: The patient is from: Home              Anticipated d/c is to:  TBD              Patient currently is not medically stable to d/c.   Difficult to place patient No       Consultants:  None  Procedures:  CT head 02/08/2021 Plain films of the right ankle 02/07/2021 Chest x-ray 02/07/2021   Antimicrobials:  None   Subjective: Patient laying on gurney.  Denies any chest pain.  No shortness of breath.  Adamantly states he did not blackout totally however felt he was going to prior to his fall.  Denies any  bowel or urinary incontinence.  Objective: Vitals:   02/08/21 1005 02/08/21 1200 02/08/21 1554 02/08/21 1636  BP: 121/74 (!) 151/80 (!) 145/88 (!) 154/86  Pulse:  77 80 72  Resp:  14 18 19   Temp:   98.4 F (36.9 C) 98.4 F (36.9 C)  TempSrc:   Oral Oral  SpO2:  97% 98% 100%  Weight:      Height:        Intake/Output Summary (Last 24 hours) at 02/08/2021 1843 Last data filed at 02/08/2021 1811 Gross per 24 hour  Intake 795 ml  Output 575 ml  Net 220 ml   Filed Weights   02/07/21 1801  Weight: 100 kg    Examination:  General exam: Appears calm and comfortable  Respiratory system: Decreased breath sounds in the bases.  Minimal expiratory wheezing.  Fair air movement.  Respiratory effort normal. Cardiovascular system: S1 & S2 heard, RRR. No JVD, murmurs, rubs, gallops or clicks. No pedal edema. Gastrointestinal system: Abdomen is nondistended, soft and nontender. No organomegaly or masses felt. Normal bowel sounds heard. Central nervous system: Alert and oriented. No focal neurological deficits. Extremities: Right lower extremity in splint.   Skin: No rashes, lesions or ulcers Psychiatry: Judgement and insight appear normal. Mood & affect appropriate.     Data Reviewed: I have personally reviewed following labs and imaging studies  CBC: Recent Labs  Lab 02/02/21 0348 02/07/21 1800 02/07/21 1816 02/08/21 0851  WBC 9.7 8.1  --  7.4  NEUTROABS 8.1* 4.2  --  4.4  HGB 11.1* 12.0* 16.0 11.5*  HCT 43.1 48.1 47.0 46.8  MCV 87.6 92.3  --  92.5  PLT 319 196  --  152    Basic Metabolic Panel: Recent Labs  Lab 02/02/21 0348 02/07/21 1800 02/07/21 1816 02/08/21 0851  NA 140 145 144 142  K 4.0 3.0* 3.2* 3.7  CL 92* 92*  --  90*  CO2 42* 48*  --  48*  GLUCOSE 130* 127*  --  116*  BUN 23 8  --  11  CREATININE 0.85 0.87  --  0.82  CALCIUM 9.3 8.7*  --  8.5*  MG 2.3  --   --  1.8  PHOS 3.8  --   --   --     GFR: Estimated Creatinine Clearance: 104.5 mL/min (by  C-G formula based on SCr of 0.82 mg/dL).  Liver Function Tests: Recent Labs  Lab 02/07/21 1800  AST 29  ALT 29  ALKPHOS 54  BILITOT 0.6  PROT 6.1*  ALBUMIN 3.4*    CBG: Recent Labs  Lab 02/08/21 0829  GLUCAP 136*     Recent Results (from the past 240 hour(s))  Resp Panel by RT-PCR (Flu A&B, Covid) Nasopharyngeal Swab     Status: None   Collection Time: 01/30/21 11:45 AM   Specimen: Nasopharyngeal Swab; Nasopharyngeal(NP) swabs in vial transport medium  Result Value Ref Range Status   SARS Coronavirus 2 by RT PCR NEGATIVE NEGATIVE Final    Comment: (NOTE) SARS-CoV-2 target nucleic acids are NOT DETECTED.  The SARS-CoV-2 RNA is generally detectable in upper respiratory specimens during the  acute phase of infection. The lowest concentration of SARS-CoV-2 viral copies this assay can detect is 138 copies/mL. A negative result does not preclude SARS-Cov-2 infection and should not be used as the sole basis for treatment or other patient management decisions. A negative result may occur with  improper specimen collection/handling, submission of specimen other than nasopharyngeal swab, presence of viral mutation(s) within the areas targeted by this assay, and inadequate number of viral copies(<138 copies/mL). A negative result must be combined with clinical observations, patient history, and epidemiological information. The expected result is Negative.  Fact Sheet for Patients:  BloggerCourse.com  Fact Sheet for Healthcare Providers:  SeriousBroker.it  This test is no t yet approved or cleared by the [place] FDA and  has been authorized for detection and/or diagnosis of SARS-CoV-2 by FDA under an Emergency Use Authorization (EUA). This EUA will remain  in effect (meaning this test can be used) for the duration of the COVID-19 declaration under Section 564(b)(1) of the Act, 21 U.S.C.section 360bbb-3(b)(1), unless the  authorization is terminated  or revoked sooner.       Influenza A by PCR NEGATIVE NEGATIVE Final   Influenza B by PCR NEGATIVE NEGATIVE Final    Comment: (NOTE) The Xpert Xpress SARS-CoV-2/FLU/RSV plus assay is intended as an aid in the diagnosis of influenza from Nasopharyngeal swab specimens and should not be used as a sole basis for treatment. Nasal washings and aspirates are unacceptable for Xpert Xpress SARS-CoV-2/FLU/RSV testing.  Fact Sheet for Patients: BloggerCourse.com  Fact Sheet for Healthcare Providers: SeriousBroker.it  This test is not yet approved or cleared by the [place] FDA and has been authorized for detection and/or diagnosis of SARS-CoV-2 by FDA under an Emergency Use Authorization (EUA). This EUA will remain in effect (meaning this test can be used) for the duration of the COVID-19 declaration under Section 564(b)(1) of the Act, 21 U.S.C. section 360bbb-3(b)(1), unless the authorization is terminated or revoked.  Performed at [facility], [address], [place], [place] [address]   Resp Panel by RT-PCR (Flu A&B, Covid) Nasopharyngeal Swab     Status: None   Collection Time: 02/08/21  3:29 PM   Specimen: Nasopharyngeal Swab; Nasopharyngeal(NP) swabs in vial transport medium  Result Value Ref Range Status   SARS Coronavirus 2 by RT PCR NEGATIVE NEGATIVE Final    Comment: (NOTE) SARS-CoV-2 target nucleic acids are NOT DETECTED.  The SARS-CoV-2 RNA is generally detectable in upper respiratory specimens during the acute phase of infection. The lowest concentration of SARS-CoV-2 viral copies this assay can detect is 138 copies/mL. A negative result does not preclude SARS-Cov-2 infection and should not be used as the sole basis for treatment or other patient management decisions. A negative result may occur with  improper specimen collection/handling, submission of specimen other than  nasopharyngeal swab, presence of viral mutation(s) within the areas targeted by this assay, and inadequate number of viral copies(<138 copies/mL). A negative result must be combined with clinical observations, patient history, and epidemiological information. The expected result is Negative.  Fact Sheet for Patients:  BloggerCourse.com  Fact Sheet for Healthcare Providers:  SeriousBroker.it  This test is no t yet approved or cleared by the [place] FDA and  has been authorized for detection and/or diagnosis of SARS-CoV-2 by FDA under an Emergency Use Authorization (EUA). This EUA will remain  in effect (meaning this test can be used) for the duration of the COVID-19 declaration under Section 564(b)(1) of the Act, 21 U.S.C.section  360bbb-3(b)(1), unless the authorization is terminated  or revoked sooner.       Influenza A by PCR NEGATIVE NEGATIVE Final   Influenza B by PCR NEGATIVE NEGATIVE Final    Comment: (NOTE) The Xpert Xpress SARS-CoV-2/FLU/RSV plus assay is intended as an aid in the diagnosis of influenza from Nasopharyngeal swab specimens and should not be used as a sole basis for treatment. Nasal washings and aspirates are unacceptable for Xpert Xpress SARS-CoV-2/FLU/RSV testing.  Fact Sheet for Patients: BloggerCourse.comhttps://www.fda.gov/media/152166/download  Fact Sheet for Healthcare Providers: SeriousBroker.ithttps://www.fda.gov/media/152162/download  This test is not yet approved or cleared by the [place] FDA and has been authorized for detection and/or diagnosis of SARS-CoV-2 by FDA under an Emergency Use Authorization (EUA). This EUA will remain in effect (meaning this test can be used) for the duration of the COVID-19 declaration under Section 564(b)(1) of the Act, 21 U.S.C. section 360bbb-3(b)(1), unless the authorization is terminated or revoked.  Performed at [facility], [address] [place], [place] [address]           Radiology Studies: DG Ankle Complete Right  Result Date: 02/07/2021 CLINICAL DATA:  Ankle pain after syncope EXAM: RIGHT ANKLE - COMPLETE 3+ VIEW COMPARISON:  None. FINDINGS: Oblique nondisplaced distal fibular fracture. Ankle mortise is otherwise preserved. The base of the fifth metatarsal is unremarkable. Mild diffuse soft tissue swelling, more prominent laterally. IMPRESSION: Oblique nondisplaced distal fibular fracture. Electronically Signed   By: Charline BillsSriyesh  Krishnan M.D.   On: 02/07/2021 19:14   CT HEAD WO CONTRAST (5MM)  Result Date: 02/08/2021 CLINICAL DATA:  Syncope EXAM: CT HEAD WITHOUT CONTRAST TECHNIQUE: Contiguous axial images were obtained from the base of the skull through the vertex without intravenous contrast. COMPARISON:  CT head 10/07/2020 FINDINGS: Brain: There is no evidence of acute intracranial hemorrhage, extra-axial fluid collection, or infarct. The ventricles are stable in size. There is no mass lesion. There is no midline shift. Vascular: There is calcification of the bilateral cavernous ICAs. Skull: Normal. Negative for fracture or focal lesion. Sinuses/Orbits: The paranasal sinuses and mastoid air cells are clear. Bilateral lens implants are in place. The globes and orbits are otherwise unremarkable. Other: None. IMPRESSION: No acute intracranial pathology. Electronically Signed   By: Lesia HausenPeter  Noone M.D.   On: 02/08/2021 09:54   DG Chest Port 1 View  Result Date: 02/07/2021 CLINICAL DATA:  Syncope EXAM: PORTABLE CHEST 1 VIEW COMPARISON:  Chest x-ray 01/30/2021, chest x-ray 01/18/2020 FINDINGS: Prominent cardiac silhouette. The heart and mediastinal contours are unchanged. Aortic calcification No focal consolidation. Coarsened interstitial markings with no pulmonary edema. No pleural effusion. No pneumothorax. No acute osseous abnormality. IMPRESSION: 1. No active disease. 2. Aortic Atherosclerosis (ICD10-I70.0) and Emphysema (ICD10-J43.9). Electronically Signed    By: Tish FredericksonMorgane  Naveau M.D.   On: 02/07/2021 19:15        Scheduled Meds:  atorvastatin  80 mg Oral Daily   ferrous sulfate  325 mg Oral BID WC   fluticasone  1 spray Each Nare Daily   fluticasone furoate-vilanterol  1 puff Inhalation Daily   gabapentin  300 mg Oral QHS   multivitamin with minerals  1 tablet Oral Daily   [START ON 02/09/2021] predniSONE  20 mg Oral Q breakfast   Followed by   Melene Muller[START ON 02/12/2021] predniSONE  10 mg Oral Q breakfast   rivaroxaban  20 mg Oral Q supper   sodium chloride flush  3 mL Intravenous Q12H   umeclidinium bromide  1 puff Inhalation Daily  Continuous Infusions:   LOS: 0 days    Time spent: 35 minutes    Ramiro Harvest, MD [facility]   To contact the attending provider between 7A-7P or the covering provider during after hours 7P-7A, please log into the web site [email or web address] and access using universal  password for that web site. If you do not have the password, please call the hospital operator.  02/08/2021, 6:43 PM

## 2021-02-09 LAB — CBC
HCT: 45 % (ref 39.0–52.0)
Hemoglobin: 11.4 g/dL — ABNORMAL LOW (ref 13.0–17.0)
MCH: 23.2 pg — ABNORMAL LOW (ref 26.0–34.0)
MCHC: 25.3 g/dL — ABNORMAL LOW (ref 30.0–36.0)
MCV: 91.5 fL (ref 80.0–100.0)
Platelets: 128 10*3/uL — ABNORMAL LOW (ref 150–400)
RBC: 4.92 MIL/uL (ref 4.22–5.81)
RDW: 16.5 % — ABNORMAL HIGH (ref 11.5–15.5)
WBC: 9.2 10*3/uL (ref 4.0–10.5)
nRBC: 0 % (ref 0.0–0.2)

## 2021-02-09 LAB — BASIC METABOLIC PANEL
Anion gap: 3 — ABNORMAL LOW (ref 5–15)
BUN: 12 mg/dL (ref 8–23)
CO2: 48 mmol/L — ABNORMAL HIGH (ref 22–32)
Calcium: 8.9 mg/dL (ref 8.9–10.3)
Chloride: 89 mmol/L — ABNORMAL LOW (ref 98–111)
Creatinine, Ser: 0.88 mg/dL (ref 0.61–1.24)
GFR, Estimated: >60 mL/min (ref >60)
Glucose, Bld: 103 mg/dL — ABNORMAL HIGH (ref 70–99)
Potassium: 4.2 mmol/L (ref 3.5–5.1)
Sodium: 140 mmol/L (ref 135–145)

## 2021-02-09 LAB — MAGNESIUM: Magnesium: 1.9 mg/dL (ref 1.7–2.4)

## 2021-02-09 LAB — GLUCOSE, CAPILLARY
Glucose-Capillary: 99 mg/dL (ref 70–99)
Glucose-Capillary: 237 mg/dL — ABNORMAL HIGH (ref 70–99)
Glucose-Capillary: 172 mg/dL — ABNORMAL HIGH (ref 70–99)

## 2021-02-09 NOTE — Progress Notes (Signed)
Physical Therapy Treatment Patient Details Name: Jared [name] MRN: [id number] DOB: 1956/02/25 Today's Date: 02/09/2021    History of Present Illness Pt is a 65yo male presenting to [facility] ED On 8/30 after syncope and collapse with resultant oblique nondisplaced distal fibular fracture. Of note, pt had recent ED visit on 8/25 for hypoxia. PMH: COPD on 4LO2 at home, CHF, anemia, atrial flutter, CAD, HTN, history of CVA.    PT Comments    Pt is now WBAT on his R leg with CAM boot. Pt made great progress with mobility this date. Pt was able to perform transfers and ambulate up to ~175 ft with a RW with min guard assist without LOB. Pt did need minA to lift his legs back onto the bed with return to supine, but he was educated on using his L leg to hook under his R to assist it superiorly onto the bed. Pt reports he has x4 STE with bil handrails at his boarding house. Will plan to progress to stair training next session. Due to pt's significant progress in a very short period of time and during the session, expect pt will not need PT follow-up at d/c. Will continue to follow acutely.    Follow Up Recommendations  No PT follow up     Equipment Recommendations  None recommended by PT    Recommendations for Other Services       Precautions / Restrictions Precautions Precautions: Fall Precaution Comments: watch BP Required Braces or Orthoses: Other Brace Other Brace: CAM boot Restrictions Weight Bearing Restrictions: Yes RLE Weight Bearing: Weight bearing as tolerated (see note by Dr. Joya Martyr 9/1, confirmed via chat)    Mobility  Bed Mobility Overal bed mobility: Needs Assistance Bed Mobility: Supine to Sit;Sit to Supine     Supine to sit: Min guard;HOB elevated Sit to supine: Min assist;HOB elevated   General bed mobility comments: Increased time with pt managing legs and trunk to sit up L EOB without physical assistance, using bed rails with HOB elevated. MinA to lift legs back into bed,  cuing pt to hook L leg under R to assist self.    Transfers Overall transfer level: Needs assistance Equipment used: Rolling walker (2 wheeled) Transfers: Sit to/from Stand Sit to Stand: From elevated surface;Min guard         General transfer comment: Increased time to power up to stand, cuing for hand placement, with sit to stand from elevated EOB, min guard for safety.  Ambulation/Gait Ambulation/Gait assistance: Min guard Gait Distance (Feet): 175 Feet Assistive device: Rolling walker (2 wheeled) Gait Pattern/deviations: Decreased stride length;Step-to pattern;Decreased stance time - right;Decreased weight shift to right;Decreased step length - left;Antalgic Gait velocity: decreased Gait velocity interpretation: <1.8 ft/sec, indicate of risk for recurrent falls General Gait Details: Pt with antalgic step-to gait pattern with decreased R stance time and thus L step length. No LOB, min guard for safety. Improved weight shifting to R as distance progressed. Cues provided to keep RW proximal when turning.   Stairs             Wheelchair Mobility    Modified Rankin (Stroke Patients Only)       Balance Overall balance assessment: Needs assistance Sitting-balance support: Feet unsupported;No upper extremity supported Sitting balance-Leahy Scale: Good     Standing balance support: Bilateral upper extremity supported;During functional activity Standing balance-Leahy Scale: Poor Standing balance comment: Pt reliant on bil UE support.  Cognition Arousal/Alertness: Awake/alert Behavior During Therapy: WFL for tasks assessed/performed Overall Cognitive Status: Within Functional Limits for tasks assessed                                        Exercises      General Comments General comments (skin integrity, edema, etc.): denied signs/symptoms of BP changes, BP supine 164/93 and sitting 162/94      Pertinent  Vitals/Pain Pain Assessment: 0-10 Pain Score: 9  Pain Location: R ankle Pain Descriptors / Indicators: Constant;Discomfort;Grimacing;Guarding Pain Intervention(s): Limited activity within patient's tolerance;Monitored during session;Repositioned;Patient requesting pain meds-RN notified;Ice applied    Home Living                      Prior Function            PT Goals (current goals can now be found in the care plan section) Acute Rehab PT Goals Patient Stated Goal: to improve PT Goal Formulation: With patient Time For Goal Achievement: 02/22/21 Potential to Achieve Goals: Good Progress towards PT goals: Progressing toward goals    Frequency    Min 3X/week      PT Plan Discharge plan needs to be updated;Equipment recommendations need to be updated    Co-evaluation              AM-PAC PT "6 Clicks" Mobility   Outcome Measure  Help needed turning from your back to your side while in a flat bed without using bedrails?: A Little Help needed moving from lying on your back to sitting on the side of a flat bed without using bedrails?: A Little Help needed moving to and from a bed to a chair (including a wheelchair)?: A Little Help needed standing up from a chair using your arms (e.g., wheelchair or bedside chair)?: A Little Help needed to walk in hospital room?: A Little Help needed climbing 3-5 steps with a railing? : A Lot 6 Click Score: 17    End of Session Equipment Utilized During Treatment: Gait belt;Oxygen (4LO2 WFL during session) Activity Tolerance: Patient limited by pain;Patient limited by fatigue Patient left: with call bell/phone within reach;in bed;with bed alarm set Nurse Communication: Mobility status;Patient requests pain meds PT Visit Diagnosis: Pain;History of falling (Z91.81);Other abnormalities of gait and mobility (R26.89);Unsteadiness on feet (R26.81);Difficulty in walking, not elsewhere classified (R26.2) Pain - Right/Left: Right Pain -  part of body: Ankle and joints of foot     Time: 1645-1715 PT Time Calculation (min) (ACUTE ONLY): 30 min  Charges:  $Gait Training: 8-22 mins $Therapeutic Activity: 8-22 mins                     Raymond Gurney, PT, DPT Acute Rehabilitation Services  Pager: [phone] Office: [phone]    [name] 02/09/2021, 5:49 PM

## 2021-02-09 NOTE — Progress Notes (Signed)
Orthopedic Tech Progress Note Patient Details:  Jared [name] 06/01/1956 [id number] Left Cam walker at bedside Ortho Devices Type of Ortho Device: CAM walker Ortho Device/Splint Location: Right leg Ortho Device/Splint Interventions: Ordered   Post Interventions Patient Tolerated: Well  Ketara Cavness A Hye Trawick 02/09/2021, 2:38 PM

## 2021-02-09 NOTE — Consult Note (Signed)
   [facility] Inpatient Consult   02/09/2021  Leocadio Heal February 08, 1956 [id number]  [facility] [[facility]]  Accountable Care Organization [ACO] Patient: [employer] Medicare/Medicaid *patient is a Public librarian*  he is in a care management program with his insurance plan  Primary Care Provider: [name], MD, [name] and Wellness is listed for the [facility] follow up  Patient was assessed for [facility] [[facility]] Care Management for community services. Patient was previously active with [facility] Care Management.  However, patient is listed  in a SNP program with [facility].  Plan:  Will sign off as his care management is with [employer].  For additional questions or referrals please contact:   Charlesetta Shanks, RN BSN CCM [facility]  [phone] business mobile phone Toll free office [phone]  Fax number: [phone] [email or web address]

## 2021-02-09 NOTE — TOC CAGE-AID Note (Signed)
Transition of Care [facility]) - CAGE-AID Screening   Patient Details  Name: Jared [name] MRN: [id number] Date of Birth: 1955-10-30  Transition of Care [facility]) CM/SW Contact:    Marnee Sherrard C Tarpley-Carter, LCSWA Phone Number: 02/09/2021, 1:39 PM   Clinical Narrative: Pt participated in Cage-Aid.  Pt stated he does not use substance or ETOH.  Pt was not offered resources, due to no usage of substance or ETOH.     Yony Roulston Tarpley-Carter, MSW, LCSW-A Pronouns:  She/Her/Hers [facility] Clinical Social Worker Direct Number:  [phone] [email or web address]  CAGE-AID Screening:    Have You Ever Felt You Ought to Cut Down on Your Drinking or Drug Use?: No Have People Annoyed You By [employer] Your Drinking Or Drug Use?: No Have You Felt Bad Or Guilty About Your Drinking Or Drug Use?: No Have You Ever Had a Drink or Used Drugs First Thing In The Morning to Steady Your Nerves or to Get Rid of a Hangover?: No CAGE-AID Score: 0  Substance Abuse Education Offered: No

## 2021-02-09 NOTE — Plan of Care (Signed)
°  Problem: Clinical Measurements: °Goal: Respiratory complications will improve °Outcome: Progressing °  °Problem: Pain Managment: °Goal: General experience of comfort will improve °Outcome: Progressing °  °Problem: Safety: °Goal: Ability to remain free from injury will improve °Outcome: Progressing °  °

## 2021-02-09 NOTE — Progress Notes (Signed)
PROGRESS NOTE    Jared [name]  JSH:[id number] DOB: 1956/02/19 DOA: 02/07/2021 PCP: [name], MD    Chief Complaint  Patient presents with   Loss of Consciousness   Shortness of Breath    Brief Narrative:  Patient 65 year old gentleman prior history of GI bleed, anemia, CHF, atrial flutter on anticoagulation, CAD, COPD/chronic respiratory failure on 4 L home O2, hypertension, resident of a boardinghouse presented to the ED after fall felt likely to be a presyncope versus syncopal episode in which patient sustained a right ankle fracture. -Patient seen in the ED splint placed and patient was to follow-up with orthopedics in the outpatient setting however due to concern for syncopal event patient admitted for further evaluation and management.   Assessment & Plan:   presyncope vs syncope/orthostasis. -Patient presented with a fall and initial concern for syncopal episode. -denies syncope however describes what seems like a presyncopal episode. -Orthostatics yesterday were borderline positive, his diuretics were held and given a liter of normal saline -Clinically appears euvolemic now -PT eval  Right ankle fracture -Noted after presyncopal episode. -Patient noted to have a nondisplaced distal fibular fracture. -Splint/immobilizer placed in the ED -Discussed with orthopedics, recommended cam boot, weightbearing as tolerated and follow-up with Dr. Susa Simmonds in 2 to 3 weeks  Hypertension/chronic diastolic and systolic CHF -2D echo from 01/31/2021 with EF of 45 to 50%, G2 DD, RV with mild to moderate reduced systolic function. -Clinically appears euvolemic now,  Hypokalemia -Repleted.  Chronic respiratory failure with hypoxia and hypercapnia/COPD -Patient with significant COPD on 4 L home O2 chronically. -Recently discharged with a steroid taper which we will continue. -Continue home budesonide -glycopyrrol-formoterol combination inhaler -Albuterol MDI. -Home O2.  History of  GI bleed/anemia -H&H stable.  Atrial flutter -Patient noted to go in and out of A. fib in the ED per ED physician. -Currently in sinus and rate controlled. -Xarelto for anticoagulation.  Coronary artery disease -Stable. -Continue statin,   DVT prophylaxis: Xarelto Code Status: Full Family Communication: Updated patient.  No family at bedside. Disposition:   Status is: Inpatient  Remains inpatient appropriate because:Inpatient level of care appropriate due to severity of illness  Dispo: The patient is from: Home              Anticipated d/c is to:  TBD              Patient currently is not medically stable to d/c.   Difficult to place patient No       Consultants:  Discussed with orthopedics  Procedures:  CT head 02/08/2021 Plain films of the right ankle 02/07/2021 Chest x-ray 02/07/2021   Antimicrobials:  None   Subjective: -Feels okay overall, breathing is at baseline  Objective: Vitals:   02/08/21 2000 02/09/21 0057 02/09/21 0427 02/09/21 0734  BP: (!) 158/93 (!) 159/99 (!) 148/86   Pulse: 82 79 82   Resp: 18 17 18    Temp: 98.7 F (37.1 C) 98.4 F (36.9 C) 98.6 F (37 C)   TempSrc: Oral Oral Oral   SpO2: 100% 100% 100% 99%  Weight:      Height:        Intake/Output Summary (Last 24 hours) at 02/09/2021 1127 Last data filed at 02/09/2021 0800 Gross per 24 hour  Intake 1215 ml  Output 750 ml  Net 465 ml   Filed Weights   02/07/21 1801  Weight: 100 kg    Examination:  General exam: Elderly pleasant chronically ill male sitting up in bed,  AAOx3, no distress CVS: S1-S2, regular rate rhythm Lungs: Poor air movement bilaterally, decreased at the bases Abdomen: Soft, nontender, bowel sounds present Extremities: Right lower leg in a splint, no edema Skin: No rash on exposed skin   Data Reviewed: I have personally reviewed following labs and imaging studies  CBC: Recent Labs  Lab 02/07/21 1800 02/07/21 1816 02/08/21 0851 02/09/21 0334  WBC  8.1  --  7.4 9.2  NEUTROABS 4.2  --  4.4  --   HGB 12.0* 16.0 11.5* 11.4*  HCT 48.1 47.0 46.8 45.0  MCV 92.3  --  92.5 91.5  PLT 196  --  152 128*    Basic Metabolic Panel: Recent Labs  Lab 02/07/21 1800 02/07/21 1816 02/08/21 0851 02/09/21 0334  NA 145 144 142 140  K 3.0* 3.2* 3.7 4.2  CL 92*  --  90* 89*  CO2 48*  --  48* 48*  GLUCOSE 127*  --  116* 103*  BUN 8  --  11 12  CREATININE 0.87  --  0.82 0.88  CALCIUM 8.7*  --  8.5* 8.9  MG  --   --  1.8 1.9    GFR: Estimated Creatinine Clearance: 97.4 mL/min (by C-G formula based on SCr of 0.88 mg/dL).  Liver Function Tests: Recent Labs  Lab 02/07/21 1800  AST 29  ALT 29  ALKPHOS 54  BILITOT 0.6  PROT 6.1*  ALBUMIN 3.4*    CBG: Recent Labs  Lab 02/08/21 0829 02/09/21 0657  GLUCAP 136* 99     Recent Results (from the past 240 hour(s))  Resp Panel by RT-PCR (Flu A&B, Covid) Nasopharyngeal Swab     Status: None   Collection Time: 01/30/21 11:45 AM   Specimen: Nasopharyngeal Swab; Nasopharyngeal(NP) swabs in vial transport medium  Result Value Ref Range Status   SARS Coronavirus 2 by RT PCR NEGATIVE NEGATIVE Final    Comment: (NOTE) SARS-CoV-2 target nucleic acids are NOT DETECTED.  The SARS-CoV-2 RNA is generally detectable in upper respiratory specimens during the acute phase of infection. The lowest concentration of SARS-CoV-2 viral copies this assay can detect is 138 copies/mL. A negative result does not preclude SARS-Cov-2 infection and should not be used as the sole basis for treatment or other patient management decisions. A negative result may occur with  improper specimen collection/handling, submission of specimen other than nasopharyngeal swab, presence of viral mutation(s) within the areas targeted by this assay, and inadequate number of viral copies(<138 copies/mL). A negative result must be combined with clinical observations, patient history, and epidemiological information. The expected  result is Negative.  Fact Sheet for Patients:  BloggerCourse.com  Fact Sheet for Healthcare Providers:  SeriousBroker.it  This test is no t yet approved or cleared by the [place] FDA and  has been authorized for detection and/or diagnosis of SARS-CoV-2 by FDA under an Emergency Use Authorization (EUA). This EUA will remain  in effect (meaning this test can be used) for the duration of the COVID-19 declaration under Section 564(b)(1) of the Act, 21 U.S.C.section 360bbb-3(b)(1), unless the authorization is terminated  or revoked sooner.       Influenza A by PCR NEGATIVE NEGATIVE Final   Influenza B by PCR NEGATIVE NEGATIVE Final    Comment: (NOTE) The Xpert Xpress SARS-CoV-2/FLU/RSV plus assay is intended as an aid in the diagnosis of influenza from Nasopharyngeal swab specimens and should not be used as a sole basis for treatment. Nasal washings and aspirates are unacceptable for Xpert Xpress  SARS-CoV-2/FLU/RSV testing.  Fact Sheet for Patients: BloggerCourse.com  Fact Sheet for Healthcare Providers: SeriousBroker.it  This test is not yet approved or cleared by the [place] FDA and has been authorized for detection and/or diagnosis of SARS-CoV-2 by FDA under an Emergency Use Authorization (EUA). This EUA will remain in effect (meaning this test can be used) for the duration of the COVID-19 declaration under Section 564(b)(1) of the Act, 21 U.S.C. section 360bbb-3(b)(1), unless the authorization is terminated or revoked.  Performed at [facility], [address] [place], [place] [address]   Resp Panel by RT-PCR (Flu A&B, Covid) Nasopharyngeal Swab     Status: None   Collection Time: 02/08/21  3:29 PM   Specimen: Nasopharyngeal Swab; Nasopharyngeal(NP) swabs in vial transport medium  Result Value Ref Range Status   SARS Coronavirus 2 by RT PCR NEGATIVE  NEGATIVE Final    Comment: (NOTE) SARS-CoV-2 target nucleic acids are NOT DETECTED.  The SARS-CoV-2 RNA is generally detectable in upper respiratory specimens during the acute phase of infection. The lowest concentration of SARS-CoV-2 viral copies this assay can detect is 138 copies/mL. A negative result does not preclude SARS-Cov-2 infection and should not be used as the sole basis for treatment or other patient management decisions. A negative result may occur with  improper specimen collection/handling, submission of specimen other than nasopharyngeal swab, presence of viral mutation(s) within the areas targeted by this assay, and inadequate number of viral copies(<138 copies/mL). A negative result must be combined with clinical observations, patient history, and epidemiological information. The expected result is Negative.  Fact Sheet for Patients:  BloggerCourse.com  Fact Sheet for Healthcare Providers:  SeriousBroker.it  This test is no t yet approved or cleared by the [place] FDA and  has been authorized for detection and/or diagnosis of SARS-CoV-2 by FDA under an Emergency Use Authorization (EUA). This EUA will remain  in effect (meaning this test can be used) for the duration of the COVID-19 declaration under Section 564(b)(1) of the Act, 21 U.S.C.section 360bbb-3(b)(1), unless the authorization is terminated  or revoked sooner.       Influenza A by PCR NEGATIVE NEGATIVE Final   Influenza B by PCR NEGATIVE NEGATIVE Final    Comment: (NOTE) The Xpert Xpress SARS-CoV-2/FLU/RSV plus assay is intended as an aid in the diagnosis of influenza from Nasopharyngeal swab specimens and should not be used as a sole basis for treatment. Nasal washings and aspirates are unacceptable for Xpert Xpress SARS-CoV-2/FLU/RSV testing.  Fact Sheet for Patients: BloggerCourse.com  Fact Sheet for Healthcare  Providers: SeriousBroker.it  This test is not yet approved or cleared by the [place] FDA and has been authorized for detection and/or diagnosis of SARS-CoV-2 by FDA under an Emergency Use Authorization (EUA). This EUA will remain in effect (meaning this test can be used) for the duration of the COVID-19 declaration under Section 564(b)(1) of the Act, 21 U.S.C. section 360bbb-3(b)(1), unless the authorization is terminated or revoked.  Performed at [facility], [address] [place], [place] [address]          Radiology Studies: DG Ankle Complete Right  Result Date: 02/07/2021 CLINICAL DATA:  Ankle pain after syncope EXAM: RIGHT ANKLE - COMPLETE 3+ VIEW COMPARISON:  None. FINDINGS: Oblique nondisplaced distal fibular fracture. Ankle mortise is otherwise preserved. The base of the fifth metatarsal is unremarkable. Mild diffuse soft tissue swelling, more prominent laterally. IMPRESSION: Oblique nondisplaced distal fibular fracture. Electronically Signed   By: Lurlean Horns  Rito Ehrlich M.D.   On: 02/07/2021 19:14   CT HEAD WO CONTRAST ( )  Result Date: 02/08/2021 CLINICAL DATA:  Syncope EXAM: CT HEAD WITHOUT CONTRAST TECHNIQUE: Contiguous axial images were obtained from the base of the skull through the vertex without intravenous contrast. COMPARISON:  CT head 10/07/2020 FINDINGS: Brain: There is no evidence of acute intracranial hemorrhage, extra-axial fluid collection, or infarct. The ventricles are stable in size. There is no mass lesion. There is no midline shift. Vascular: There is calcification of the bilateral cavernous ICAs. Skull: Normal. Negative for fracture or focal lesion. Sinuses/Orbits: The paranasal sinuses and mastoid air cells are clear. Bilateral lens implants are in place. The globes and orbits are otherwise unremarkable. Other: None. IMPRESSION: No acute intracranial pathology. Electronically Signed   By: Lesia Hausen M.D.   On: 02/08/2021  09:54   DG Chest Port 1 View  Result Date: 02/07/2021 CLINICAL DATA:  Syncope EXAM: PORTABLE CHEST 1 VIEW COMPARISON:  Chest x-ray 01/30/2021, chest x-ray 01/18/2020 FINDINGS: Prominent cardiac silhouette. The heart and mediastinal contours are unchanged. Aortic calcification No focal consolidation. Coarsened interstitial markings with no pulmonary edema. No pleural effusion. No pneumothorax. No acute osseous abnormality. IMPRESSION: 1. No active disease. 2. Aortic Atherosclerosis (ICD10-I70.0) and Emphysema (ICD10-J43.9). Electronically Signed   By: Tish Frederickson M.D.   On: 02/07/2021 19:15        Scheduled Meds:  atorvastatin  80 mg Oral Daily   ferrous sulfate  325 mg Oral BID WC   fluticasone  1 spray Each Nare Daily   fluticasone furoate-vilanterol  1 puff Inhalation Daily   gabapentin  300 mg Oral QHS   multivitamin with minerals  1 tablet Oral Daily   predniSONE  20 mg Oral Q breakfast   Followed by   Melene Muller ON 02/12/2021] predniSONE  10 mg Oral Q breakfast   rivaroxaban  20 mg Oral Q supper   sodium chloride flush  3 mL Intravenous Q12H   umeclidinium bromide  1 puff Inhalation Daily   Continuous Infusions:   LOS: 1 day    Time spent: 35 minutes    Zannie Cove, MD [facility]  02/09/2021, 11:27 AM

## 2021-02-10 ENCOUNTER — Inpatient Hospital Stay (HOSPITAL_COMMUNITY): Payer: Medicare HMO

## 2021-02-10 LAB — GLUCOSE, CAPILLARY
Glucose-Capillary: 96 mg/dL (ref 70–99)
Glucose-Capillary: 181 mg/dL — ABNORMAL HIGH (ref 70–99)
Glucose-Capillary: 198 mg/dL — ABNORMAL HIGH (ref 70–99)
Glucose-Capillary: 157 mg/dL — ABNORMAL HIGH (ref 70–99)

## 2021-02-10 LAB — BASIC METABOLIC PANEL
Anion gap: 5 (ref 5–15)
BUN: 10 mg/dL (ref 8–23)
CO2: 45 mmol/L — ABNORMAL HIGH (ref 22–32)
Calcium: 8.9 mg/dL (ref 8.9–10.3)
Chloride: 88 mmol/L — ABNORMAL LOW (ref 98–111)
Creatinine, Ser: 0.77 mg/dL (ref 0.61–1.24)
GFR, Estimated: >60 mL/min (ref >60)
Glucose, Bld: 99 mg/dL (ref 70–99)
Potassium: 4.2 mmol/L (ref 3.5–5.1)
Sodium: 138 mmol/L (ref 135–145)

## 2021-02-10 MED ORDER — FUROSEMIDE 40 MG PO TABS
40.0000 mg | ORAL_TABLET | Freq: Every day | ORAL | Status: DC
  Administered 2021-02-10 – 2021-02-11 (×2): 40 mg via ORAL
  Filled 2021-02-10 (×2): qty 1

## 2021-02-10 NOTE — Care Management Important Message (Signed)
Important Message  Patient Details  Name: Jared [name] MRN: [id number] Date of Birth: 08/03/1955   Medicare Important Message Given:  Yes     Renie Ora 02/10/2021, 11:04 AM

## 2021-02-10 NOTE — Progress Notes (Signed)
PROGRESS NOTE    Jared [name]  VZD:[id number] DOB: 1955-09-25 DOA: 02/07/2021 PCP: [name], MD    Chief Complaint  Patient presents with   Loss of Consciousness   Shortness of Breath    Brief Narrative:  Patient 65 year old gentleman prior history of GI bleed, anemia, CHF, atrial flutter on anticoagulation, CAD, COPD/chronic respiratory failure on 4 L home O2, hypertension, resident of a boardinghouse presented to the ED after fall felt likely to be a presyncope versus syncopal episode in which patient sustained a right ankle fracture. -Patient seen in the ED splint placed and patient was to follow-up with orthopedics in the outpatient setting however due to concern for syncopal event patient admitted for further evaluation and management.   Assessment & Plan:   presyncope vs syncope/orthostatic hypotension -Patient presented with a fall and initial concern for syncopal episode. -denies syncope however describes what seems like a presyncopal episode. -Orthostatics on admission were borderline positive, his diuretics were held and given a liter of normal saline -Clinically appears euvolemic now, resume oral Lasix -PT eval, SNF was recommended initially, now not needed -Discharge planning  Right ankle fracture -After fall, presyncopal episode. -Sustained a nondisplaced distal fibular fracture. -Splint/immobilizer placed in the ED -Discussed with orthopedics, recommended cam boot, weightbearing as tolerated and follow-up with Dr. Susa Simmonds in 2 to 3 weeks  Hypertension/chronic diastolic and systolic CHF -2D echo from 01/31/2021 with EF of 45 to 50%, G2 DD, RV with mild to moderate reduced systolic function. -Clinically appears euvolemic now, resume Lasix 40 mg daily  Hypokalemia -Repleted.  Chronic respiratory failure with hypoxia and hypercapnia/COPD -Patient with significant COPD on 4 L home O2 chronically. -Recently discharged with a steroid taper which we will  continue. -Continue home budesonide -glycopyrrol-formoterol combination inhaler -Albuterol MDI.  History of GI bleed/anemia -H&H stable.  Atrial flutter -Patient noted to go in and out of A. fib in the ED per ED physician. -Currently in sinus and rate controlled. -Xarelto for anticoagulation.  Coronary artery disease -Stable. -Continue statin,   DVT prophylaxis: Xarelto Code Status: Full Family Communication: Updated patient.  No family at bedside. Disposition:   Status is: Inpatient  Remains inpatient appropriate because:Inpatient level of care appropriate due to severity of illness  Dispo: The patient is from: Home              Anticipated d/c is to: Home tomorrow              Patient currently is not medically stable to d/c.   Difficult to place patient No       Consultants:  Discussed with orthopedics  Procedures:  CT head 02/08/2021 Plain films of the right ankle 02/07/2021 Chest x-ray 02/07/2021   Antimicrobials:  None   Subjective: -Had some difficulty breathing earlier this morning, improving now, starting to ambulate  Objective: Vitals:   02/09/21 2023 02/10/21 0030 02/10/21 0509 02/10/21 0832  BP: (!) 151/97 (!) 159/95 133/84   Pulse: 76 81 83 80  Resp: 20 20 20 20   Temp: 98.6 F (37 C) 98.1 F (36.7 C) 98.4 F (36.9 C)   TempSrc: Oral Oral Oral   SpO2: 100% 100% 93% 93%  Weight:   109.1 kg   Height:        Intake/Output Summary (Last 24 hours) at 02/10/2021 1209 Last data filed at 02/10/2021 0100 Gross per 24 hour  Intake 540 ml  Output 1775 ml  Net -1235 ml   Filed Weights   02/07/21 1801 02/10/21  3016  Weight: 100 kg 109.1 kg    Examination:  General exam: Pleasant elderly male sitting up in bed, AAOx3, no distress CVS: S1-S2, regular rate rhythm Lungs: Poor air movement bilaterally Abdomen: Soft, nontender, bowel sounds present Extremities: Right lower leg in a splint, no edema Skin: No rash on exposed skin   Data  Reviewed: I have personally reviewed following labs and imaging studies  CBC: Recent Labs  Lab 02/07/21 1800 02/07/21 1816 02/08/21 0851 02/09/21 0334  WBC 8.1  --  7.4 9.2  NEUTROABS 4.2  --  4.4  --   HGB 12.0* 16.0 11.5* 11.4*  HCT 48.1 47.0 46.8 45.0  MCV 92.3  --  92.5 91.5  PLT 196  --  152 128*    Basic Metabolic Panel: Recent Labs  Lab 02/07/21 1800 02/07/21 1816 02/08/21 0851 02/09/21 0334 02/10/21 0403  NA 145 144 142 140 138  K 3.0* 3.2* 3.7 4.2 4.2  CL 92*  --  90* 89* 88*  CO2 48*  --  48* 48* 45*  GLUCOSE 127*  --  116* 103* 99  BUN 8  --  11 12 10   CREATININE 0.87  --  0.82 0.88 0.77  CALCIUM 8.7*  --  8.5* 8.9 8.9  MG  --   --  1.8 1.9  --     GFR: Estimated Creatinine Clearance: 119.5 mL/min (by C-G formula based on SCr of 0.77 mg/dL).  Liver Function Tests: Recent Labs  Lab 02/07/21 1800  AST 29  ALT 29  ALKPHOS 54  BILITOT 0.6  PROT 6.1*  ALBUMIN 3.4*    CBG: Recent Labs  Lab 02/09/21 0657 02/09/21 1134 02/09/21 2107 02/10/21 0558 02/10/21 1132  GLUCAP 99 237* 172* 96 181*     Recent Results (from the past 240 hour(s))  Resp Panel by RT-PCR (Flu A&B, Covid) Nasopharyngeal Swab     Status: None   Collection Time: 02/08/21  3:29 PM   Specimen: Nasopharyngeal Swab; Nasopharyngeal(NP) swabs in vial transport medium  Result Value Ref Range Status   SARS Coronavirus 2 by RT PCR NEGATIVE NEGATIVE Final    Comment: (NOTE) SARS-CoV-2 target nucleic acids are NOT DETECTED.  The SARS-CoV-2 RNA is generally detectable in upper respiratory specimens during the acute phase of infection. The lowest concentration of SARS-CoV-2 viral copies this assay can detect is 138 copies/mL. A negative result does not preclude SARS-Cov-2 infection and should not be used as the sole basis for treatment or other patient management decisions. A negative result may occur with  improper specimen collection/handling, submission of specimen other than  nasopharyngeal swab, presence of viral mutation(s) within the areas targeted by this assay, and inadequate number of viral copies(<138 copies/mL). A negative result must be combined with clinical observations, patient history, and epidemiological information. The expected result is Negative.  Fact Sheet for Patients:  02/10/21  Fact Sheet for Healthcare Providers:  BloggerCourse.com  This test is no t yet approved or cleared by the [place] FDA and  has been authorized for detection and/or diagnosis of SARS-CoV-2 by FDA under an Emergency Use Authorization (EUA). This EUA will remain  in effect (meaning this test can be used) for the duration of the COVID-19 declaration under Section 564(b)(1) of the Act, 21 U.S.C.section 360bbb-3(b)(1), unless the authorization is terminated  or revoked sooner.       Influenza A by PCR NEGATIVE NEGATIVE Final   Influenza B by PCR NEGATIVE NEGATIVE Final    Comment: (NOTE)  The Xpert Xpress SARS-CoV-2/FLU/RSV plus assay is intended as an aid in the diagnosis of influenza from Nasopharyngeal swab specimens and should not be used as a sole basis for treatment. Nasal washings and aspirates are unacceptable for Xpert Xpress SARS-CoV-2/FLU/RSV testing.  Fact Sheet for Patients: BloggerCourse.com  Fact Sheet for Healthcare Providers: SeriousBroker.it  This test is not yet approved or cleared by the [place] FDA and has been authorized for detection and/or diagnosis of SARS-CoV-2 by FDA under an Emergency Use Authorization (EUA). This EUA will remain in effect (meaning this test can be used) for the duration of the COVID-19 declaration under Section 564(b)(1) of the Act, 21 U.S.C. section 360bbb-3(b)(1), unless the authorization is terminated or revoked.  Performed at [facility], [address], [place], [place] [address]       Scheduled Meds:  atorvastatin  80 mg Oral Daily   ferrous sulfate  325 mg Oral BID WC   fluticasone  1 spray Each Nare Daily   fluticasone furoate-vilanterol  1 puff Inhalation Daily   furosemide  40 mg Oral Daily   gabapentin  300 mg Oral QHS   multivitamin with minerals  1 tablet Oral Daily   predniSONE  20 mg Oral Q breakfast   Followed by   Melene Muller ON 02/12/2021] predniSONE  10 mg Oral Q breakfast   rivaroxaban  20 mg Oral Q supper   sodium chloride flush  3 mL Intravenous Q12H   umeclidinium bromide  1 puff Inhalation Daily   Continuous Infusions:   LOS: 2 days    Time spent: 35 minutes    Zannie Cove, MD [facility]  02/10/2021, 12:09 PM

## 2021-02-10 NOTE — Progress Notes (Signed)
Physical Therapy Treatment Patient Details Name: Jared [name] MRN: [id number] DOB: 26-Mar-1956 Today's Date: 02/10/2021    History of Present Illness Pt is a 65yo male presenting to [facility] ED On 8/30 after syncope and collapse with resultant oblique nondisplaced distal fibular fracture. Of note, pt had recent ED visit on 8/25 for hypoxia. PMH: COPD on 4LO2 at home, CHF, anemia, atrial flutter, CAD, HTN, history of CVA.    PT Comments    Pt expressed concern over trying to carry objects in the kitchen while using the RW. Simulated this through pt carrying PT's arm and pushing anterior aspect of RW with other UE without LOB. Educated him to have 1 UE support on either RW or counter for safety. Pt is able to perform all functional mobility, including gait with a RW and x4 stairs with a rail, at a min guard-supervision level. He does display deficits in endurance and pain that impact his ease with these activities. Expect pt to functionally improve quickly as the pain improves. Will continue to follow acutely. Current recommendations remain appropriate.   Follow Up Recommendations  No PT follow up     Equipment Recommendations  None recommended by PT    Recommendations for Other Services       Precautions / Restrictions Precautions Precautions: Fall Precaution Comments: watch BP Required Braces or Orthoses: Other Brace Other Brace: CAM boot Restrictions Weight Bearing Restrictions: Yes RLE Weight Bearing: Weight bearing as tolerated    Mobility  Bed Mobility Overal bed mobility: Needs Assistance Bed Mobility: Supine to Sit;Sit to Supine     Supine to sit: Supervision Sit to supine: Supervision   General bed mobility comments: Pt able to transition supine <> sit EOB with supervision with bed flat to simulate home.    Transfers Overall transfer level: Needs assistance Equipment used: Rolling walker (2 wheeled) Transfers: Sit to/from Stand Sit to Stand: Min guard          General transfer comment: Pt needing increased time and repositioning of hands and sometimes multiple attempts to come to stand from low surfaced chairs without arm rests, but no overt LOB or physical assistance needed. Min guard assist for safety.  Ambulation/Gait Ambulation/Gait assistance: Supervision Gait Distance (Feet): 90 Feet (x3 bouts of ~60 ft > ~30 ft > ~90 ft) Assistive device: Rolling walker (2 wheeled) Gait Pattern/deviations: Decreased stride length;Step-to pattern;Decreased stance time - right;Decreased weight shift to right;Decreased step length - left;Antalgic Gait velocity: decreased Gait velocity interpretation: <1.8 ft/sec, indicate of risk for recurrent falls General Gait Details: Pt with antalgic step-to gait pattern with decreased R stance time and thus L step length. No LOB, supervision for safety. Cues to improve upright posture.   Stairs Stairs: Yes Stairs assistance: Min guard Stair Management: One rail Right;One rail Left;Step to pattern;Forwards Number of Stairs: 4 General stair comments: Ascends with R rail and descends with L rail with bil UEs on rail at all times. Pt displays step-to gait pattern. Educated pt prior to stairs to ascend with L leg leading and descend with R leg leading, noted good carryover and success. Pt with excessive trunk flexion and UE reliance, but no LOB, min guard for safety.   Wheelchair Mobility    Modified Rankin (Stroke Patients Only)       Balance Overall balance assessment: Needs assistance Sitting-balance support: Feet unsupported;No upper extremity supported Sitting balance-Leahy Scale: Normal Sitting balance - Comments: Donns CAM boot EOB without LOB.   Standing balance support: Bilateral upper extremity supported;During  functional activity;Single extremity supported Standing balance-Leahy Scale: Poor Standing balance comment: Pt reliant on at least 1 UE support. Able to carry PT's arm in one UE while pushing  anterior aspect of RW with other to simulate carrying light objects in the kitchen.                            Cognition Arousal/Alertness: Awake/alert Behavior During Therapy: WFL for tasks assessed/performed Overall Cognitive Status: Within Functional Limits for tasks assessed                                        Exercises      General Comments General comments (skin integrity, edema, etc.): Pain reproduced at R medial knee with palpation of medial joint space/MCL, notified RN and MD who ordered x-ray. Unable to perform good valgus stress test this date due to muscle guarding by pt. Educated pt to have someone carry his RW up/down stairs for him. Educated pt to hold onto counter or RW with 1 UE when carrying object with other UE for safety; educated pt on donning/doffing of CAM boot with pt performing EOB      Pertinent Vitals/Pain Pain Assessment: Faces Faces Pain Scale: Hurts whole lot Pain Location: R ankle, R medial knee Pain Descriptors / Indicators: Constant;Discomfort;Grimacing;Guarding Pain Intervention(s): Limited activity within patient's tolerance;Monitored during session;Repositioned;Other (comment) (notified MD and RN of tenderness with palpation at medial knee joint space/MCL; unable to perform good valgus test due to muscle guarding)    Home Living                      Prior Function            PT Goals (current goals can now be found in the care plan section) Acute Rehab PT Goals Patient Stated Goal: to improve PT Goal Formulation: With patient/family Time For Goal Achievement: 02/22/21 Potential to Achieve Goals: Good Progress towards PT goals: Progressing toward goals    Frequency    Min 3X/week      PT Plan Current plan remains appropriate    Co-evaluation              AM-PAC PT "6 Clicks" Mobility   Outcome Measure  Help needed turning from your back to your side while in a flat bed without using  bedrails?: A Little Help needed moving from lying on your back to sitting on the side of a flat bed without using bedrails?: A Little Help needed moving to and from a bed to a chair (including a wheelchair)?: A Little Help needed standing up from a chair using your arms (e.g., wheelchair or bedside chair)?: A Little Help needed to walk in hospital room?: A Little Help needed climbing 3-5 steps with a railing? : A Little 6 Click Score: 18    End of Session Equipment Utilized During Treatment: Gait belt;Oxygen (4LO2 WFL during session) Activity Tolerance: Patient limited by pain;Patient limited by fatigue Patient left: with call bell/phone within reach;in bed;with bed alarm set Nurse Communication: Mobility status;Other (comment) (pain at medial knee) PT Visit Diagnosis: Pain;History of falling (Z91.81);Other abnormalities of gait and mobility (R26.89);Unsteadiness on feet (R26.81);Difficulty in walking, not elsewhere classified (R26.2) Pain - Right/Left: Right Pain - part of body: Ankle and joints of foot     Time: 6812-7517 PT Time Calculation (min) (  ACUTE ONLY): 35 min  Charges:  $Gait Training: 8-22 mins $Therapeutic Activity: 8-22 mins                     Raymond Gurney, PT, DPT Acute Rehabilitation Services  Pager: [phone] Office: [phone]    Jared [name] 02/10/2021, 6:20 PM

## 2021-02-10 NOTE — Plan of Care (Signed)
  Problem: Activity: Goal: Risk for activity intolerance will decrease Outcome: Progressing   Problem: Pain Managment: Goal: General experience of comfort will improve Outcome: Progressing   Problem: Safety: Goal: Ability to remain free from injury will improve Outcome: Progressing   

## 2021-02-11 LAB — BASIC METABOLIC PANEL
Anion gap: 8 (ref 5–15)
BUN: 12 mg/dL (ref 8–23)
CO2: 45 mmol/L — ABNORMAL HIGH (ref 22–32)
Calcium: 9.1 mg/dL (ref 8.9–10.3)
Chloride: 88 mmol/L — ABNORMAL LOW (ref 98–111)
Creatinine, Ser: 0.68 mg/dL (ref 0.61–1.24)
GFR, Estimated: >60 mL/min (ref >60)
Glucose, Bld: 83 mg/dL (ref 70–99)
Potassium: 4.1 mmol/L (ref 3.5–5.1)
Sodium: 141 mmol/L (ref 135–145)

## 2021-02-11 LAB — GLUCOSE, CAPILLARY: Glucose-Capillary: 109 mg/dL — ABNORMAL HIGH (ref 70–99)

## 2021-02-11 MED ORDER — TRAMADOL HCL 50 MG PO TABS: 50.0000 mg | ORAL_TABLET | Freq: Four times a day (QID) | ORAL | 0 refills | Status: DC | PRN

## 2021-02-11 MED ORDER — PANTOPRAZOLE SODIUM 40 MG PO TBEC
40.0000 mg | DELAYED_RELEASE_TABLET | Freq: Every day | ORAL | Status: DC
  Administered 2021-02-11: 40 mg via ORAL
  Filled 2021-02-11: qty 1

## 2021-02-11 MED ORDER — PANTOPRAZOLE SODIUM 40 MG PO TBEC: 40.0000 mg | DELAYED_RELEASE_TABLET | Freq: Every day | ORAL | Status: DC

## 2021-02-11 NOTE — Discharge Summary (Signed)
Physician Discharge Summary  Jared [name] QQV:[id number] DOB: 1955-07-23 DOA: 02/07/2021  PCP: [name], MD  Admit date: 02/07/2021 Discharge date: 02/11/2021  Time spent:  Recommendations for Outpatient Follow-up:  Follow-up with orthopedics in 3 to 4 weeks for distal fibular fracture, weightbearing as tolerated, referral sent PCP in 1 week   Discharge Diagnoses:  Principal Problem:   Pre-syncope Advanced COPD/chronic respiratory failure on 4 L home O2   Essential hypertension   CAD (coronary artery disease)   COPD GOLD III/still smoking    Atrial flutter (HCC)   Iron deficiency anemia   Chronic anticoagulation   Chronic respiratory failure with hypoxia and hypercapnia (HCC)   Syncope and collapse   Closed fracture of distal end of fibula, unspecified fracture morphology, initial encounter   Discharge Condition: Stable  Diet recommendation: Heart healthy, low-sodium  Filed Weights   02/07/21 1801 02/10/21 0509  Weight: 100 kg 109.1 kg    History of present illness: 65 year old gentleman prior history of GI bleed, anemia, CHF, atrial flutter on anticoagulation, CAD, COPD/chronic respiratory failure on 4 L home O2, hypertension, resident of a [place] presented to the ED after fall felt likely to be a presyncope versus syncopal episode in which patient sustained a right ankle fracture. -Patient seen in the ED splint placed and patient was to follow-up with orthopedics in the outpatient setting however due to concern for syncopal event patient admitted for further evaluation and management.  Hospital Course:   Near Syncope/orthostatic hypotension -Patient presented with a fall and initial concern for syncopal episode. -denies syncope however describes what seems like a presyncopal episode. -Orthostatics on admission were borderline positive, his diuretics were held and given a liter of normal saline -Clinically euvolemic now, resumed oral Lasix -Evaluated  by PT, no further therapy needed, discharged home in a stable condition, advised to follow-up with PCP in 1 week   Right ankle fracture -After fall, presyncopal episode. -Sustained a nondisplaced distal fibular fracture. -Splint/immobilizer placed in the ED -Discussed with orthopedics, recommended cam boot, weightbearing as tolerated and follow-up with Dr. Susa Simmonds in 2 to 3 weeks  Hypertension/ chronic diastolic and systolic CHF -2D echo from 01/31/2021 with EF of 45 to 50%, G2 DD, RV with mild to moderate reduced systolic function. -Clinically appears euvolemic now, resumed Lasix 40 mg daily  Hypokalemia -Repleted.  Chronic respiratory failure with hypoxia and hypercapnia/COPD -Patient with significant COPD on 4 L home O2 chronically. -Continue home budesonide -glycopyrrol-formoterol combination inhaler -Albuterol MDI.   History of GI bleed/anemia -Hemoglobin stable  Atrial flutter -Patient noted to go in and out of A. fib in the ED per ED physician. -Currently in sinus and rate controlled. -Xarelto for anticoagulation.  Coronary artery disease -Stable. -Continue statin,    Discharge Exam: Vitals:   02/11/21 0315 02/11/21 1144  BP: (!) 162/116 (!) 156/119  Pulse: 73 97  Resp: 14 18  Temp: 98 F (36.7 C)   SpO2: 100% 96%    General: AAOx3 Cardiovascular: S1S2/RRR Respiratory: CTAB  Discharge Instructions   Discharge Instructions     Ambulatory referral to Orthopedic Surgery   Complete by: As directed    R ankle, distal fibular fracture   Diet - low sodium heart healthy   Complete by: As directed    Increase activity slowly   Complete by: As directed       Allergies as of 02/11/2021       Reactions   Lisinopril Cough        Medication  List     STOP taking these medications    predniSONE 10 MG tablet Commonly known as: DELTASONE       TAKE these medications    albuterol 108 (90 Base) MCG/ACT inhaler Commonly known as: VENTOLIN HFA INHALE  2 PUFFS INTO THE LUNGS EVERY 6 (SIX) HOURS AS NEEDED FOR WHEEZING OR SHORTNESS OF BREATH.   albuterol (2.5 MG/3ML) 0.083% nebulizer solution Commonly known as: PROVENTIL Take 3 mLs (2.5 mg total) by nebulization every 4 (four) hours as needed for wheezing or shortness of breath.   atorvastatin 80 MG tablet Commonly known as: LIPITOR TAKE 1 TABLET EVERY DAY   Breztri Aerosphere 160-9-4.8 MCG/ACT Aero Generic drug: Budeson-Glycopyrrol-Formoterol INHALE 2 PUFFS INTO THE LUNGS 2 (TWO) TIMES DAILY.   ferrous sulfate 325 (65 FE) MG tablet Take 1 tablet (325 mg total) by mouth 2 (two) times daily with a meal.   fluticasone 50 MCG/ACT nasal spray Commonly known as: FLONASE Place 1 spray into both nostrils daily.   furosemide 40 MG tablet Commonly known as: LASIX TAKE 1 TABLET EVERY DAY   gabapentin 300 MG capsule Commonly known as: NEURONTIN TAKE 1 CAPSULE (300 MG TOTAL) BY MOUTH AT BEDTIME. What changed: how much to take   Misc. Devices Misc Inhale 3 L into the lungs as needed. Portable oxygen concentrator. Diagnosis COPD.   multivitamin with minerals Tabs tablet Take 1 tablet by mouth daily.   nitroGLYCERIN 0.4 MG SL tablet Commonly known as: NITROSTAT Place 1 tablet (0.4 mg total) under the tongue every 5 (five) minutes as needed for chest pain.   pantoprazole 40 MG tablet Commonly known as: PROTONIX Take 1 tablet (40 mg total) by mouth daily.   traMADol 50 MG tablet Commonly known as: ULTRAM Take 1 tablet (50 mg total) by mouth every 6 (six) hours as needed for moderate pain.   Xarelto 20 MG Tabs tablet Generic drug: rivaroxaban TAKE 1 TABLET EVERY DAY WITH SUPPER What changed: See the new instructions.       Allergies  Allergen Reactions   Lisinopril Cough    Follow-up Information     [name], MD. Schedule an appointment as soon as possible for a visit in 1 week(s).   Specialty: Family Medicine Contact information: [address]  [place] [address] [phone]         Orthopedics Follow up in 1 month(s).   Why: Office will call you with FU        [facility] Follow up.   Why: [facility] is the new name for [facility]. [facility] is going to sendo someone to check on your home oxygen pulse regulator. Contact information: [address] [place] [address] [phone]                  The results of significant diagnostics from this hospitalization (including imaging, microbiology, ancillary and laboratory) are listed below for reference.    Significant Diagnostic Studies: DG Knee 1-2 Views Right  Result Date: 02/10/2021 CLINICAL DATA:  Fall 3 days ago with persistent knee pain. EXAM: RIGHT KNEE - 1-2 VIEW COMPARISON:  None. FINDINGS: Small knee joint effusion. No fracture or dislocation. No evidence of lipohemarthrosis. Mild-to-moderate tricompartmental degenerative change of the knee, worse within the medial compartment with joint space loss, subchondral sclerosis and osteophytosis. There is spurring involving the superior pole the patella. Adjacent vascular calcifications. Regional soft tissues appear otherwise normal. No radiopaque foreign body. IMPRESSION: 1. Small  knee joint effusion.  Otherwise, no acute findings. 2. Mild-to-moderate tricompartmental degenerative change of the knee, worse within the medial compartment. Electronically Signed   By: Simonne Come M.D.   On: 02/10/2021 17:06   DG Ankle Complete Right  Result Date: 02/07/2021 CLINICAL DATA:  Ankle pain after syncope EXAM: RIGHT ANKLE - COMPLETE 3+ VIEW COMPARISON:  None. FINDINGS: Oblique nondisplaced distal fibular fracture. Ankle mortise is otherwise preserved. The base of the fifth metatarsal is unremarkable. Mild diffuse soft tissue swelling, more prominent laterally. IMPRESSION: Oblique nondisplaced distal fibular fracture. Electronically Signed   By: Charline Bills M.D.   On:  02/07/2021 19:14   CT HEAD WO CONTRAST ( )  Result Date: 02/08/2021 CLINICAL DATA:  Syncope EXAM: CT HEAD WITHOUT CONTRAST TECHNIQUE: Contiguous axial images were obtained from the base of the skull through the vertex without intravenous contrast. COMPARISON:  CT head 10/07/2020 FINDINGS: Brain: There is no evidence of acute intracranial hemorrhage, extra-axial fluid collection, or infarct. The ventricles are stable in size. There is no mass lesion. There is no midline shift. Vascular: There is calcification of the bilateral cavernous ICAs. Skull: Normal. Negative for fracture or focal lesion. Sinuses/Orbits: The paranasal sinuses and mastoid air cells are clear. Bilateral lens implants are in place. The globes and orbits are otherwise unremarkable. Other: None. IMPRESSION: No acute intracranial pathology. Electronically Signed   By: Lesia Hausen M.D.   On: 02/08/2021 09:54   DG Chest Port 1 View  Result Date: 02/07/2021 CLINICAL DATA:  Syncope EXAM: PORTABLE CHEST 1 VIEW COMPARISON:  Chest x-ray 01/30/2021, chest x-ray 01/18/2020 FINDINGS: Prominent cardiac silhouette. The heart and mediastinal contours are unchanged. Aortic calcification No focal consolidation. Coarsened interstitial markings with no pulmonary edema. No pleural effusion. No pneumothorax. No acute osseous abnormality. IMPRESSION: 1. No active disease. 2. Aortic Atherosclerosis (ICD10-I70.0) and Emphysema (ICD10-J43.9). Electronically Signed   By: Tish Frederickson M.D.   On: 02/07/2021 19:15   DG Chest Portable 1 View  Result Date: 01/30/2021 CLINICAL DATA:  Hypoxia EXAM: PORTABLE CHEST 1 VIEW COMPARISON:  11/18/2020 FINDINGS: Cardiomegaly, which is stable to slightly increased in size. Aortic atherosclerosis. No focal pulmonary opacity. No significant pleural fluid. Redemonstrated healed left rib fractures. No acute osseous abnormality. IMPRESSION: 1. Stable to slightly increased cardiomegaly without evidence of pulmonary edema. 2.   Aortic Atherosclerosis (ICD10-I70.0). Electronically Signed   By: Wiliam Ke M.D.   On: 01/30/2021 12:28   ECHOCARDIOGRAM COMPLETE  Result Date: 01/31/2021    ECHOCARDIOGRAM REPORT   Patient Name:   Jared [name] Date of Exam: 01/31/2021 Medical Rec #:  [id number]    Height:       75.0 in Accession #:    [id number]   Weight:       220.5 lb Date of Birth:  May 08, 1956     BSA:          2.287 m Patient Age:    65 years     BP:           119/82 mmHg Patient Gender: M            HR:           94 bpm. Exam Location:  Inpatient Procedure: Cardiac Doppler, Color Doppler, 2D Echo and 3D Echo Indications:    CHF-Acute Systolic I50.21  History:        Patient has prior history of Echocardiogram examinations.                 Respiratory  Failure on Bipap.  Sonographer:    Roosvelt Maser RDCS Referring Phys: [id number] ALLISON WOLFE IMPRESSIONS  1. Left ventricular ejection fraction, by estimation, is 45 to 50%. Left ventricular ejection fraction by 3D volume is 48 %. The left ventricle has mildly decreased function. The left ventricle demonstrates global hypokinesis. There is moderate left ventricular hypertrophy. Left ventricular diastolic parameters are consistent with Grade II diastolic dysfunction (pseudonormalization).  2. Right ventricular systolic function is moderately reduced. The right ventricular size is moderately enlarged. There is moderately elevated pulmonary artery systolic pressure while on BiPAP.  3. Left atrial size was moderately dilated.  4. Right atrial size was moderately dilated.  5. The mitral valve is normal in structure. Trivial mitral valve regurgitation. No evidence of mitral stenosis.  6. The aortic valve is tricuspid. There is mild calcification of the aortic valve. Aortic valve regurgitation is trivial. Mild aortic valve sclerosis is present, with no evidence of aortic valve stenosis. FINDINGS  Left Ventricle: Left ventricular ejection fraction, by estimation, is 45 to 50%. Left ventricular ejection  fraction by 3D volume is 48 %. The left ventricle has mildly decreased function. The left ventricle demonstrates global hypokinesis. The left ventricular internal cavity size was normal in size. There is moderate left ventricular hypertrophy. Left ventricular diastolic parameters are consistent with Grade II diastolic dysfunction (pseudonormalization). Right Ventricle: The right ventricular size is moderately enlarged. No increase in right ventricular wall thickness. Right ventricular systolic function is moderately reduced. There is moderately elevated pulmonary artery systolic pressure. The tricuspid  regurgitant velocity is 3.14 m/s, and with an assumed right atrial pressure of 15 mmHg, the estimated right ventricular systolic pressure is 54.4 mmHg. Left Atrium: Left atrial size was moderately dilated. Right Atrium: Right atrial size was moderately dilated. Pericardium: Trivial pericardial effusion is present. Presence of pericardial fat pad. Mitral Valve: The mitral valve is normal in structure. Trivial mitral valve regurgitation. No evidence of mitral valve stenosis. Tricuspid Valve: The tricuspid valve is normal in structure. Tricuspid valve regurgitation is mild . No evidence of tricuspid stenosis. Aortic Valve: The aortic valve is tricuspid. There is mild calcification of the aortic valve. Aortic valve regurgitation is trivial. Mild aortic valve sclerosis is present, with no evidence of aortic valve stenosis. Aortic valve mean gradient measures 5.0 mmHg. Aortic valve peak gradient measures 9.9 mmHg. Aortic valve area, by VTI measures 3.06 cm. Pulmonic Valve: The pulmonic valve was normal in structure. Pulmonic valve regurgitation is not visualized. No evidence of pulmonic stenosis. Aorta: The aortic root is normal in size and structure. Venous: IVC assessment for right atrial pressure unable to be performed due to mechanical ventilation. IAS/Shunts: No atrial level shunt detected by color flow Doppler.   LEFT VENTRICLE PLAX 2D LVIDd:         5.70 cm         Diastology LVIDs:         4.30 cm         LV e' medial:    5.98 cm/s LV PW:         1.50 cm         LV E/e' medial:  17.4 LV IVS:        1.30 cm         LV e' lateral:   8.16 cm/s LVOT diam:     2.20 cm         LV E/e' lateral: 12.7 LV SV:         96 LV  SV Index:   42 LVOT Area:     3.80 cm        3D Volume EF                                LV 3D EF:    Left                                             ventricul                                             ar                                             ejection                                             fraction                                             by 3D                                             volume is                                             48 %.                                 3D Volume EF:                                3D EF:        48 %                                LV EDV:       148 ml                                LV ESV:       77 ml                                LV SV:        71 ml RIGHT VENTRICLE            IVC RV Basal diam:  4.50 cm  IVC diam: 2.70 cm RV Mid diam:    3.80 cm RV S prime:     8.40 cm/s TAPSE (M-mode): 2.0 cm LEFT ATRIUM              Index       RIGHT ATRIUM           Index LA diam:        3.60 cm  1.57 cm/m  RA Area:     26.60 cm LA Vol (A2C):   92.8 ml  40.57 ml/m RA Volume:   91.40 ml  39.96 ml/m LA Vol (A4C):   100.0 ml 43.72 ml/m LA Biplane Vol: 104.0 ml 45.47 ml/m  AORTIC VALVE AV Area (Vmax):    3.37 cm AV Area (Vmean):   3.01 cm AV Area (VTI):     3.06 cm AV Vmax:           157.00 cm/s AV Vmean:          106.000 cm/s AV VTI:            0.313 m AV Peak Grad:      9.9 mmHg AV Mean Grad:      5.0 mmHg LVOT Vmax:         139.00 cm/s LVOT Vmean:        84.000 cm/s LVOT VTI:          0.252 m LVOT/AV VTI ratio: 0.81  AORTA Ao Root diam: 3.40 cm MITRAL VALVE                TRICUSPID VALVE MV Area (PHT): 3.53 cm     TR Peak grad:   39.4 mmHg MV Decel Time:  215 msec     TR Vmax:        314.00 cm/s MV E velocity: 104.00 cm/s MV A velocity: 83.00 cm/s   SHUNTS MV E/A ratio:  1.25         Systemic VTI:  0.25 m                             Systemic Diam: 2.20 cm Weston BrassGayatri Acharya MD Electronically signed by Weston BrassGayatri Acharya MD Signature Date/Time: 01/31/2021/10:56:06 AM    Final     Microbiology: Recent Results (from the past 240 hour(s))  Resp Panel by RT-PCR (Flu A&B, Covid) Nasopharyngeal Swab     Status: None   Collection Time: 02/08/21  3:29 PM   Specimen: Nasopharyngeal Swab; Nasopharyngeal(NP) swabs in vial transport medium  Result Value Ref Range Status   SARS Coronavirus 2 by RT PCR NEGATIVE NEGATIVE Final    Comment: (NOTE) SARS-CoV-2 target nucleic acids are NOT DETECTED.  The SARS-CoV-2 RNA is generally detectable in upper respiratory specimens during the acute phase of infection. The lowest concentration of SARS-CoV-2 viral copies this assay can detect is 138 copies/mL. A negative result does not preclude SARS-Cov-2 infection and should not be used as the sole basis for treatment or other patient management decisions. A negative result may occur with  improper specimen collection/handling, submission of specimen other than nasopharyngeal swab, presence of viral mutation(s) within the areas targeted by this assay, and inadequate number of viral copies(<138 copies/mL). A negative result must be combined with clinical observations, patient history, and epidemiological information. The expected result is Negative.  Fact Sheet for Patients:  BloggerCourse.comhttps://www.fda.gov/media/152166/download  Fact Sheet for Healthcare Providers:  SeriousBroker.ithttps://www.fda.gov/media/152162/download  This test is no t yet approved or cleared  by the [place] and  has been authorized for detection and/or diagnosis of SARS-CoV-2 by FDA under an Emergency Use Authorization (EUA). This EUA will remain  in effect (meaning this test can be used) for the duration of  the COVID-19 declaration under Section 564(b)(1) of the Act, 21 U.S.C.section 360bbb-3(b)(1), unless the authorization is terminated  or revoked sooner.       Influenza A by PCR NEGATIVE NEGATIVE Final   Influenza B by PCR NEGATIVE NEGATIVE Final    Comment: (NOTE) The Xpert Xpress SARS-CoV-2/FLU/RSV plus assay is intended as an aid in the diagnosis of influenza from Nasopharyngeal swab specimens and should not be used as a sole basis for treatment. Nasal washings and aspirates are unacceptable for Xpert Xpress SARS-CoV-2/FLU/RSV testing.  Fact Sheet for Patients: BloggerCourse.com  Fact Sheet for Healthcare Providers: SeriousBroker.it  This test is not yet approved or cleared by the [place] FDA and has been authorized for detection and/or diagnosis of SARS-CoV-2 by FDA under an Emergency Use Authorization (EUA). This EUA will remain in effect (meaning this test can be used) for the duration of the COVID-19 declaration under Section 564(b)(1) of the Act, 21 U.S.C. section 360bbb-3(b)(1), unless the authorization is terminated or revoked.  Performed at [facility], [address], [place], [place] [address]      Labs: Basic Metabolic Panel: Recent Labs  Lab 02/07/21 1800 02/07/21 1816 02/08/21 0851 02/09/21 0334 02/10/21 0403 02/11/21 0154  NA 145 144 142 140 138 141  K 3.0* 3.2* 3.7 4.2 4.2 4.1  CL 92*  --  90* 89* 88* 88*  CO2 48*  --  48* 48* 45* 45*  GLUCOSE 127*  --  116* 103* 99 83  BUN 8  --  11 12 10 12   CREATININE 0.87  --  0.82 0.88 0.77 0.68  CALCIUM 8.7*  --  8.5* 8.9 8.9 9.1  MG  --   --  1.8 1.9  --   --    Liver Function Tests: Recent Labs  Lab 02/07/21 1800  AST 29  ALT 29  ALKPHOS 54  BILITOT 0.6  PROT 6.1*  ALBUMIN 3.4*   No results for input(s): LIPASE, AMYLASE in the last 168 hours. No results for input(s): AMMONIA in the last 168 hours. CBC: Recent Labs  Lab  02/07/21 1800 02/07/21 1816 02/08/21 0851 02/09/21 0334  WBC 8.1  --  7.4 9.2  NEUTROABS 4.2  --  4.4  --   HGB 12.0* 16.0 11.5* 11.4*  HCT 48.1 47.0 46.8 45.0  MCV 92.3  --  92.5 91.5  PLT 196  --  152 128*   Cardiac Enzymes: No results for input(s): CKTOTAL, CKMB, CKMBINDEX, TROPONINI in the last 168 hours. BNP: BNP (last 3 results) Recent Labs    11/01/20 1012 01/30/21 1111 02/07/21 1807  BNP 700.5* 1,093.1* 659.2*    ProBNP (last 3 results) No results for input(s): PROBNP in the last 8760 hours.  CBG: Recent Labs  Lab 02/10/21 0558 02/10/21 1132 02/10/21 1800 02/10/21 2146 02/11/21 0618  GLUCAP 96 181* 198* 157* 109*       Signed:  Zannie Cove MD.  [facility] 02/11/2021, 11:49 AM

## 2021-02-11 NOTE — Plan of Care (Signed)
  Problem: Acute Rehab PT Goals(only PT should resolve) Goal: Patient Will Transfer Sit To/From Stand Outcome: Completed/Met Goal: Pt Will Transfer Bed To Chair/Chair To Bed Outcome: Completed/Met Goal: Pt Will Ambulate Outcome: Completed/Met Goal: Pt Will Go Up/Down Stairs Outcome: Completed/Met   Problem: Acute Rehab OT Goals (only OT should resolve) Goal: Pt. Will Perform Grooming Outcome: Completed/Met Goal: Pt. Will Perform Lower Body Bathing Outcome: Completed/Met Goal: Pt. Will Perform Lower Body Dressing Outcome: Completed/Met Goal: Pt. Will Transfer To Toilet Outcome: Completed/Met   Problem: Education: Goal: Knowledge of General Education information will improve Description: Including pain rating scale, medication(s)/side effects and non-pharmacologic comfort measures Outcome: Completed/Met   Problem: Health Behavior/Discharge Planning: Goal: Ability to manage health-related needs will improve Outcome: Completed/Met   Problem: Clinical Measurements: Goal: Ability to maintain clinical measurements within normal limits will improve Outcome: Completed/Met Goal: Will remain free from infection Outcome: Completed/Met Goal: Diagnostic test results will improve Outcome: Completed/Met Goal: Respiratory complications will improve Outcome: Completed/Met Goal: Cardiovascular complication will be avoided Outcome: Completed/Met   Problem: Activity: Goal: Risk for activity intolerance will decrease Outcome: Completed/Met   Problem: Nutrition: Goal: Adequate nutrition will be maintained Outcome: Completed/Met   Problem: Coping: Goal: Level of anxiety will decrease Outcome: Completed/Met   Problem: Elimination: Goal: Will not experience complications related to bowel motility Outcome: Completed/Met Goal: Will not experience complications related to urinary retention Outcome: Completed/Met   Problem: Pain Managment: Goal: General experience of comfort will  improve Outcome: Completed/Met   Problem: Safety: Goal: Ability to remain free from injury will improve Outcome: Completed/Met   Problem: Skin Integrity: Goal: Risk for impaired skin integrity will decrease Outcome: Completed/Met Discharge instructions reviewed with patient.  These included, but were not all inclusive of, the following:  Discharge medications, follow-up appointments, when to call the MD, activity recommendations, use of the orthopedic boot when ambulating, safety precautions, alternative pain medication means (e.g application of ice, elevation of extremity, etc.).  Comprehension of instructions ascertained via use of "teach-back" technique.

## 2021-02-11 NOTE — Progress Notes (Signed)
Was asked by primary MD to remove splint and ace prior to discharge.  Will do so and provide with ace wrap for support on discharge  with instructions to ice area prn and elevate to reduce dependant edema.

## 2021-02-11 NOTE — Progress Notes (Signed)
Received message from [place], PT, pt is asking about his home O2. Met with pt. Pt has been on home O2 for 4 years. He receives his O2 supplies through [facility]. He reports that the pulse regulator is not working and that's one of the reasons why he is in the hospital. He reports that he has been calling [facility], but he was told that they don't deliver anymore. He reports that he has small and large O2 tanks and the large O2 tanks need to be return. He reports that his sister was picking up the O2, but [facility] moved and he doesn't know the new address. He reports that he needs a small O2 tank to be D/C today. Conctacted Jazmine with [facility]. She reports that she is going to send somebody to check on the home O2 pulse regulator and she is going to provide the pt with an E tank today. Provided pt with the new address for [facility].

## 2021-02-11 NOTE — Progress Notes (Signed)
Physical Therapy Treatment Patient Details Name: Jared [name] MRN: [id number] DOB: [birth year] Today's Date: 02/11/2021    History of Present Illness Pt is a 65yo male presenting to [facility] ED On 8/30 after syncope and collapse with resultant oblique nondisplaced distal fibular fracture. Of note, pt had recent ED visit on 8/25 for hypoxia. PMH: COPD on 4LO2 at home, CHF, anemia, atrial flutter, CAD, HTN, history of CVA.    PT Comments    Pt requested not to get OOB due to plan for d/c soon. Instead, focused session on bed level exercises to improve R lower extremity strength and AROM. Also, focused session on educating pt and providing pt with handouts on stair negotiation with rail and RW and on energy conservation. Pt verbalized understanding of safe techniques and guarding with mobility and ADLs. Will continue to follow acutely.   Follow Up Recommendations  No PT follow up     Equipment Recommendations  None recommended by PT    Recommendations for Other Services       Precautions / Restrictions Precautions Precautions: Fall Precaution Comments: watch BP Required Braces or Orthoses: Other Brace Other Brace: CAM boot Restrictions Weight Bearing Restrictions: Yes RLE Weight Bearing: Weight bearing as tolerated    Mobility  Bed Mobility               General bed mobility comments: Focused session on bed level exercises and education as pt requested not to get OOB due to plan to d/c soon    Transfers                 General transfer comment: Focused session on bed level exercises and education as pt requested not to get OOB due to plan to d/c soon  Ambulation/Gait             General Gait Details: Focused session on bed level exercises and education as pt requested not to get OOB due to plan to d/c soon   Stairs         General stair comments: Provided handout, educated, and demonstrated use of RW with rail on stairs. Focused session on bed level  exercises and education as pt requested not to get OOB due to plan to d/c soon   Wheelchair Mobility    Modified Rankin (Stroke Patients Only)       Balance                                            Cognition Arousal/Alertness: Awake/alert Behavior During Therapy: WFL for tasks assessed/performed Overall Cognitive Status: Within Functional Limits for tasks assessed                                        Exercises Total Joint Exercises Ankle Circles/Pumps: AROM;Right;10 reps;Supine Quad Sets: Strengthening;Right;10 reps;Supine Heel Slides: Right;10 reps;Supine;Strengthening Straight Leg Raises: Strengthening;Right;10 reps;Supine    General Comments General comments (skin integrity, edema, etc.): Provided handout and education on energy conservation and stairs using rail and RW. Educated pt to get chair to sit on to perform tasks in kitchen for safety. Educated pt to have someone guard him on stairs. Pt verbalized understanding.      Pertinent Vitals/Pain Pain Assessment: Faces Faces Pain Scale: Hurts little more Pain Location: R ankle, R medial  knee Pain Descriptors / Indicators: Constant;Discomfort;Grimacing;Guarding Pain Intervention(s): Limited activity within patient's tolerance;Monitored during session;Repositioned    Home Living                      Prior Function            PT Goals (current goals can now be found in the care plan section) Acute Rehab PT Goals Patient Stated Goal: to go home PT Goal Formulation: With patient Time For Goal Achievement: 02/22/21 Potential to Achieve Goals: Good Progress towards PT goals: Progressing toward goals    Frequency    Min 3X/week      PT Plan Current plan remains appropriate    Co-evaluation              AM-PAC PT "6 Clicks" Mobility   Outcome Measure  Help needed turning from your back to your side while in a flat bed without using bedrails?: A  Little Help needed moving from lying on your back to sitting on the side of a flat bed without using bedrails?: A Little Help needed moving to and from a bed to a chair (including a wheelchair)?: A Little Help needed standing up from a chair using your arms (e.g., wheelchair or bedside chair)?: A Little Help needed to walk in hospital room?: A Little Help needed climbing 3-5 steps with a railing? : A Little 6 Click Score: 18    End of Session Equipment Utilized During Treatment: Oxygen (4LO2) Activity Tolerance: Patient limited by pain;Patient limited by fatigue Patient left: with call bell/phone within reach;in bed;with bed alarm set   PT Visit Diagnosis: Pain;History of falling (Z91.81);Other abnormalities of gait and mobility (R26.89);Unsteadiness on feet (R26.81);Difficulty in walking, not elsewhere classified (R26.2) Pain - Right/Left: Right Pain - part of body: Ankle and joints of foot     Time: 0258-5277 PT Time Calculation (min) (ACUTE ONLY): 15 min  Charges:  $Therapeutic Activity: 8-22 mins                     Raymond Gurney, PT, DPT Acute Rehabilitation Services  Pager: [phone] Office: [phone]    [name] 02/11/2021, 10:01 AM

## 2021-02-13 ENCOUNTER — Emergency Department (HOSPITAL_COMMUNITY): Payer: Medicare HMO

## 2021-02-13 ENCOUNTER — Emergency Department (HOSPITAL_COMMUNITY)
Admission: EM | Admit: 2021-02-13 | Discharge: 2021-02-14 | Disposition: A | Discharge status: Home / Self Care | Payer: Medicare HMO | Attending: Emergency Medicine | Admitting: Emergency Medicine

## 2021-02-13 DIAGNOSIS — J449 Chronic obstructive pulmonary disease, unspecified: Secondary | ICD-10-CM | POA: Diagnosis not present

## 2021-02-13 DIAGNOSIS — R600 Localized edema: Secondary | ICD-10-CM | POA: Insufficient documentation

## 2021-02-13 DIAGNOSIS — R0902 Hypoxemia: Secondary | ICD-10-CM | POA: Diagnosis not present

## 2021-02-13 DIAGNOSIS — R000 Tachycardia, unspecified: Secondary | ICD-10-CM | POA: Diagnosis not present

## 2021-02-13 DIAGNOSIS — I517 Cardiomegaly: Secondary | ICD-10-CM | POA: Diagnosis not present

## 2021-02-13 DIAGNOSIS — I5042 Chronic combined systolic (congestive) and diastolic (congestive) heart failure: Secondary | ICD-10-CM | POA: Diagnosis not present

## 2021-02-13 DIAGNOSIS — F17210 Nicotine dependence, cigarettes, uncomplicated: Secondary | ICD-10-CM | POA: Diagnosis not present

## 2021-02-13 DIAGNOSIS — I110 Hypertensive heart disease with heart failure: Secondary | ICD-10-CM | POA: Insufficient documentation

## 2021-02-13 DIAGNOSIS — J439 Emphysema, unspecified: Secondary | ICD-10-CM | POA: Diagnosis not present

## 2021-02-13 DIAGNOSIS — Z79899 Other long term (current) drug therapy: Secondary | ICD-10-CM | POA: Insufficient documentation

## 2021-02-13 DIAGNOSIS — J441 Chronic obstructive pulmonary disease with (acute) exacerbation: Secondary | ICD-10-CM | POA: Diagnosis not present

## 2021-02-13 DIAGNOSIS — I2510 Atherosclerotic heart disease of native coronary artery without angina pectoris: Secondary | ICD-10-CM | POA: Insufficient documentation

## 2021-02-13 DIAGNOSIS — R0602 Shortness of breath: Secondary | ICD-10-CM | POA: Diagnosis not present

## 2021-02-13 LAB — COMPREHENSIVE METABOLIC PANEL
ALT: 25 U/L (ref 0–44)
AST: 53 U/L — ABNORMAL HIGH (ref 15–41)
Albumin: 3.4 g/dL — ABNORMAL LOW (ref 3.5–5.0)
Alkaline Phosphatase: 58 U/L (ref 38–126)
Anion gap: 7 (ref 5–15)
BUN: 13 mg/dL (ref 8–23)
CO2: 41 mmol/L — ABNORMAL HIGH (ref 22–32)
Calcium: 9 mg/dL (ref 8.9–10.3)
Chloride: 94 mmol/L — ABNORMAL LOW (ref 98–111)
Creatinine, Ser: 0.79 mg/dL (ref 0.61–1.24)
GFR, Estimated: >60 mL/min (ref >60)
Glucose, Bld: 102 mg/dL — ABNORMAL HIGH (ref 70–99)
Potassium: 4.2 mmol/L (ref 3.5–5.1)
Sodium: 142 mmol/L (ref 135–145)
Total Bilirubin: 1 mg/dL (ref 0.3–1.2)
Total Protein: 6.2 g/dL — ABNORMAL LOW (ref 6.5–8.1)

## 2021-02-13 LAB — CBC WITH DIFFERENTIAL/PLATELET
Abs Immature Granulocytes: 0.03 10*3/uL (ref 0.00–0.07)
Basophils Absolute: 0.1 10*3/uL (ref 0.0–0.1)
Basophils Relative: 1 %
Eosinophils Absolute: 0.2 10*3/uL (ref 0.0–0.5)
Eosinophils Relative: 2 %
HCT: 46.7 % (ref 39.0–52.0)
Hemoglobin: 12.1 g/dL — ABNORMAL LOW (ref 13.0–17.0)
Immature Granulocytes: 0 %
Lymphocytes Relative: 24 %
Lymphs Abs: 2.1 10*3/uL (ref 0.7–4.0)
MCH: 23.6 pg — ABNORMAL LOW (ref 26.0–34.0)
MCHC: 25.9 g/dL — ABNORMAL LOW (ref 30.0–36.0)
MCV: 91 fL (ref 80.0–100.0)
Monocytes Absolute: 0.8 10*3/uL (ref 0.1–1.0)
Monocytes Relative: 9 %
Neutro Abs: 5.7 10*3/uL (ref 1.7–7.7)
Neutrophils Relative %: 64 %
Platelets: 85 10*3/uL — ABNORMAL LOW (ref 150–400)
RBC: 5.13 MIL/uL (ref 4.22–5.81)
RDW: 17.5 % — ABNORMAL HIGH (ref 11.5–15.5)
WBC: 8.9 10*3/uL (ref 4.0–10.5)
nRBC: 0 % (ref 0.0–0.2)

## 2021-02-13 LAB — TROPONIN I (HIGH SENSITIVITY)
Troponin I (High Sensitivity): 55 ng/L — ABNORMAL HIGH (ref <18)
Troponin I (High Sensitivity): 58 ng/L — ABNORMAL HIGH (ref <18)

## 2021-02-13 MED ORDER — PREDNISONE 10 MG PO TABS
50.0000 mg | ORAL_TABLET | Freq: Every day | ORAL | 0 refills | Status: AC
  Filled 2021-02-14: qty 25, 5d supply, fill #0

## 2021-02-13 MED ORDER — IOHEXOL 350 MG/ML SOLN
65.0000 mL | Freq: Once | INTRAVENOUS | Status: AC | PRN
  Administered 2021-02-13: 65 mL via INTRAVENOUS

## 2021-02-13 NOTE — ED Notes (Signed)
Patient transported to CT 

## 2021-02-13 NOTE — ED Provider Notes (Signed)
[facility] EMERGENCY DEPARTMENT Provider Note   CSN: [id number] Arrival date & time: 02/13/21  1758     History No chief complaint on file.   Jared [name] is a 65 y.o. male with PMHx HTN, CAD, CHF, COPD with chronic 4L [place] oxygen requirement, DM, atrial flutter on Xarelto who presents for evaluation of shortness of breath.   Patient states that he was in his normal state of health for most of the day.  When sitting down for dinner this evening, he states that he experienced acute onset of profound shortness of breath and diaphoresis.  This lasted for several minutes prior to resolving without intervention.  She denies experiencing any associated chest pain.  He denies any recent fever, chills, or cough.  He notes increased edema in his right lower extremity.  Of note, patient was recently admitted following hospital admission from 8/30 through 9/3 for syncope as well as right ankle fracture.  Patient was placed in a CAM boot for his ankle fracture and planned for orthopedic surgery follow-up.     Past Medical History:  Diagnosis Date   AVM (arteriovenous malformation)    CAD (coronary artery disease)    a. LHC 5/12:  LAD 20, pLCx 20, pRCA 40, dRCA 40, EF 35%, diff HK  //  b. Myoview 4/16: Overall Impression:  High risk stress nuclear study There is no evidence of ischemia.  There is severe LV dysfunction. LV Ejection Fraction: 30%.  LV Wall Motion:  There is global LV hypokinesis.     CAP (community acquired pneumonia) 09/2013   Chronic combined systolic and diastolic CHF (congestive heart failure) (HCC)    a. Echo 4/16:Mild LVH, EF 40-45%, diffuse HK //  b. Echo 8/17: EF 35-40%, diffuse HK, diastolic dysfunction, aortic sclerosis, trivial MR, moderate LAE, normal RVSF, moderate RAE, mild TR, PASP 42 mmHg // c. Echo 4/18: Mild concentric LVH, EF 30-35, normal wall motion, grade 1 diastolic dysfunction, PASP 49   Chronic respiratory failure (HCC)    Cluster headache     "hx; haven't had one in awhile" (01/09/2016)   COPD (chronic obstructive pulmonary disease) (HCC)    [name] 01/09/2016   History of CVA (cerebrovascular accident)    Hypertension    IDA (iron deficiency anemia)    Moderate tobacco use disorder    NICM (nonischemic cardiomyopathy) (HCC)    Nicotine addiction    Tobacco abuse     Patient Active Problem List   Diagnosis Date Noted   Closed fracture of distal end of fibula, unspecified fracture morphology, initial encounter    Pre-syncope    Syncope and collapse 02/07/2021   Elevated troponin 01/30/2021   History of GI bleed    Gastric ulcer due to Helicobacter pylori and nonsteroidal anti-inflammatory drug (NSAID)    AKI (acute kidney injury) (HCC) 11/18/2020   Epigastric pain    Acute gastric ulcer with hemorrhage    Acute blood loss anemia    Acute upper GI bleed 11/12/2020   Hematemesis with nausea    Acute GI bleeding 06/11/2019   Syncope 06/11/2019   Chronic right maxillary sinusitis 01/29/2019   Chronic respiratory failure with hypoxia and hypercapnia (HCC) 01/01/2019   Acute on chronic respiratory failure with hypoxia and hypercapnia (HCC) 11/10/2018   GI bleed 10/27/2018   Chronic anticoagulation 09/27/2018   AV malformation of gastrointestinal tract    Occult GI bleeding    Heme positive stool    Iron deficiency anemia    Acute  on chronic anemia 09/04/2018   Noncompliance 06/14/2017   Elevated LFTs 08/13/2016   Chronic respiratory failure with hypoxia (HCC) 07/24/2016   Atrial flutter (HCC)    Hepatic congestion 07/02/2016   Acute on chronic combined systolic and diastolic CHF (congestive heart failure) (HCC) 07/01/2016   COPD with acute exacerbation (HCC) 06/03/2016   Prediabetes 05/14/2016   Hemoptysis 05/06/2016   COPD GOLD III/still smoking  02/29/2016   Cigarette smoker 01/09/2016   CAD (coronary artery disease) 10/07/2013   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   Centrilobular emphysema (HCC)  10/04/2013   Essential hypertension     Past Surgical History:  Procedure Laterality Date   BIOPSY  11/12/2020   Procedure: BIOPSY;  Surgeon: Lemar Lofty., MD;  Location: [facility] ENDOSCOPY;  Service: Gastroenterology;;   CARDIAC CATHETERIZATION  10/2010   LM normal, LAD with 20% irregularities, LCX with 20%, RCA with 40% prox and 40% distal - EF of 35%   CATARACT EXTRACTION, BILATERAL     COLONOSCOPY W/ BIOPSIES AND POLYPECTOMY     COLONOSCOPY WITH PROPOFOL N/A 09/06/2018   Procedure: COLONOSCOPY WITH PROPOFOL;  Surgeon: Tressia Danas, MD;  Location: [facility] ENDOSCOPY;  Service: Gastroenterology;  Laterality: N/A;   ENTEROSCOPY N/A 09/28/2018   Procedure: ENTEROSCOPY;  Surgeon: Jeani Hawking, MD;  Location: [facility] ENDOSCOPY;  Service: Endoscopy;  Laterality: N/A;   ENTEROSCOPY N/A 10/28/2018   Procedure: ENTEROSCOPY;  Surgeon: Tressia Danas, MD;  Location: [facility] ENDOSCOPY;  Service: Gastroenterology;  Laterality: N/A;   ENTEROSCOPY N/A 10/09/2020   Procedure: ENTEROSCOPY;  Surgeon: Hilarie Fredrickson, MD;  Location: [facility] ENDOSCOPY;  Service: Endoscopy;  Laterality: N/A;   ESOPHAGOGASTRODUODENOSCOPY N/A 11/12/2020   Procedure: ESOPHAGOGASTRODUODENOSCOPY (EGD);  Surgeon: Lemar Lofty., MD;  Location: [facility] ENDOSCOPY;  Service: Gastroenterology;  Laterality: N/A;   ESOPHAGOGASTRODUODENOSCOPY (EGD) WITH PROPOFOL N/A 09/05/2018   Procedure: ESOPHAGOGASTRODUODENOSCOPY (EGD) WITH PROPOFOL;  Surgeon: Benancio Deeds, MD;  Location: [facility] ENDOSCOPY;  Service: Gastroenterology;  Laterality: N/A;   ESOPHAGOGASTRODUODENOSCOPY (EGD) WITH PROPOFOL N/A 11/19/2020   Procedure: ESOPHAGOGASTRODUODENOSCOPY (EGD) WITH PROPOFOL;  Surgeon: Beverley Fiedler, MD;  Location: [facility] ENDOSCOPY;  Service: Gastroenterology;  Laterality: N/A;   EXCISION MASS HEAD     GIVENS CAPSULE STUDY N/A 09/06/2018   Procedure: GIVENS CAPSULE STUDY;  Surgeon: Tressia Danas, MD;  Location: [facility] ENDOSCOPY;  Service: Gastroenterology;  Laterality:  N/A;   GIVENS CAPSULE STUDY N/A 09/26/2018   Procedure: GIVENS CAPSULE STUDY;  Surgeon: Beverley Fiedler, MD;  Location: [facility] ENDOSCOPY;  Service: Gastroenterology;  Laterality: N/A;   GIVENS CAPSULE STUDY N/A 06/14/2019   Procedure: GIVENS CAPSULE STUDY;  Surgeon: Napoleon Form, MD;  Location: [facility] ENDOSCOPY;  Service: Endoscopy;  Laterality: N/A;   HEMOSTASIS CLIP PLACEMENT  11/12/2020   Procedure: HEMOSTASIS CLIP PLACEMENT;  Surgeon: Lemar Lofty., MD;  Location: [facility] ENDOSCOPY;  Service: Gastroenterology;;   HEMOSTASIS CONTROL  11/12/2020   Procedure: HEMOSTASIS CONTROL;  Surgeon: Lemar Lofty., MD;  Location: [facility] ENDOSCOPY;  Service: Gastroenterology;;   HOT HEMOSTASIS N/A 10/28/2018   Procedure: HOT HEMOSTASIS (ARGON PLASMA COAGULATION/BICAP);  Surgeon: Tressia Danas, MD;  Location: [facility] ENDOSCOPY;  Service: Gastroenterology;  Laterality: N/A;   HOT HEMOSTASIS N/A 11/12/2020   Procedure: HOT HEMOSTASIS (ARGON PLASMA COAGULATION/BICAP);  Surgeon: Lemar Lofty., MD;  Location: [facility] ENDOSCOPY;  Service: Gastroenterology;  Laterality: N/A;   INCISION AND DRAINAGE PERIRECTAL ABSCESS N/A 06/05/2017   Procedure: IRRIGATION AND DEBRIDEMENT PERIRECTAL ABSCESS;  Surgeon: Andria Meuse, MD;  Location: [facility] OR;  Service:  General;  Laterality: N/A;   SUBMUCOSAL TATTOO INJECTION  11/12/2020   Procedure: SUBMUCOSAL TATTOO INJECTION;  Surgeon: Lemar Lofty., MD;  Location: [facility] ENDOSCOPY;  Service: Gastroenterology;;   VIDEO BRONCHOSCOPY Bilateral 05/08/2016   Procedure: VIDEO BRONCHOSCOPY WITH FLUORO;  Surgeon: Oretha Milch, MD;  Location: [facility] ENDOSCOPY;  Service: Cardiopulmonary;  Laterality: Bilateral;       Family History  Problem Relation Age of Onset   Heart disease Mother    Diabetes Mother    Colon cancer Mother    Liver cancer Mother    Cancer Father        type unknown   Diabetes Sister        x 2   Diabetes Brother     Social History   Tobacco Use    Smoking status: Some Days    Packs/day: 2.00    Years: 47.00    Pack years: 94.00    Types: Cigarettes   Smokeless tobacco: Never  Vaping Use   Vaping Use: Never used  Substance Use Topics   Alcohol use: Not Currently    Alcohol/week: 0.0 standard drinks    Comment: last drink was before xmas   Drug use: No    Types: Cocaine, Marijuana    Comment: "nothing in 20 years"    Home Medications Prior to Admission medications   Medication Sig Start Date End Date Taking? Authorizing Provider  predniSONE (DELTASONE) 10 MG tablet Take 5 tablets (50 mg total) by mouth daily for 5 days. 02/13/21 02/19/21 Yes Holley Dexter, MD  albuterol (PROVENTIL) (2.5 MG/3ML) 0.083% nebulizer solution Take 3 mLs (2.5 mg total) by nebulization every 4 (four) hours as needed for wheezing or shortness of breath. 11/01/20   Allayne Stack, DO  albuterol (VENTOLIN HFA) 108 (90 Base) MCG/ACT inhaler INHALE 2 PUFFS INTO THE LUNGS EVERY 6 (SIX) HOURS AS NEEDED FOR WHEEZING OR SHORTNESS OF BREATH. 08/15/20 08/15/21  [name], MD  atorvastatin (LIPITOR) 80 MG tablet TAKE 1 TABLET EVERY DAY Patient taking differently: Take 80 mg by mouth daily. 12/21/20   [name], MD  Budeson-Glycopyrrol-Formoterol 160-9-4.8 MCG/ACT AERO INHALE 2 PUFFS INTO THE LUNGS 2 (TWO) TIMES DAILY. 04/27/20 04/27/21  Nyoka Cowden, MD  ferrous sulfate 325 (65 FE) MG tablet Take 1 tablet (325 mg total) by mouth 2 (two) times daily with a meal. 11/16/20   Esaw Grandchild A, DO  fluticasone (FLONASE) 50 MCG/ACT nasal spray Place 1 spray into both nostrils daily. 01/11/21   [name], MD  furosemide (LASIX) 40 MG tablet TAKE 1 TABLET EVERY DAY Patient taking differently: Take 40 mg by mouth daily. 12/21/20   [name], MD  gabapentin (NEURONTIN) 300 MG capsule TAKE 1 CAPSULE (300 MG TOTAL) BY MOUTH AT BEDTIME. Patient taking differently: Take 300 mg by mouth at bedtime. 12/14/20 12/14/21  [name], MD  Misc. Devices MISC Inhale 3 L  into the lungs as needed. Portable oxygen concentrator. Diagnosis COPD. 11/19/20   Albertine Grates, MD  Multiple Vitamin (MULTIVITAMIN WITH MINERALS) TABS tablet Take 1 tablet by mouth daily. 11/16/20   Pennie Banter, DO  nitroGLYCERIN (NITROSTAT) 0.4 MG SL tablet Place 1 tablet (0.4 mg total) under the tongue every 5 (five) minutes as needed for chest pain. 11/19/20 11/19/21  Albertine Grates, MD  pantoprazole (PROTONIX) 40 MG tablet Take 1 tablet (40 mg total) by mouth daily. 02/11/21   Zannie Cove, MD  traMADol (ULTRAM) 50 MG tablet Take 1 tablet (50 mg total)  by mouth every 6 (six) hours as needed for moderate pain. 02/11/21   Zannie CoveJoseph, Preetha, MD  XARELTO 20 MG TABS tablet TAKE 1 TABLET EVERY DAY WITH SUPPER Patient taking differently: Take 20 mg by mouth daily with supper. 12/21/20   Hoy RegisterNewlin, Enobong, MD  TUDORZA PRESSAIR 400 MCG/ACT AEPB INHALE 1 PUFF INTO THE LUNGS IN THE MORNING AND AT BEDTIME. 03/29/20 04/04/20  Nyoka CowdenWert, Michael B, MD    Allergies    Lisinopril  Review of Systems   Review of Systems  Constitutional:  Positive for diaphoresis. Negative for chills and fever.  HENT:  Negative for ear pain and sore throat.   Eyes:  Negative for pain and visual disturbance.  Respiratory:  Positive for shortness of breath. Negative for cough.   Cardiovascular:  Negative for chest pain and palpitations.  Gastrointestinal:  Negative for abdominal pain and vomiting.  Genitourinary:  Negative for dysuria and hematuria.  Musculoskeletal:  Negative for arthralgias and back pain.  Skin:  Negative for color change and rash.  Neurological:  Negative for seizures and syncope.  All other systems reviewed and are negative.  Physical Exam Updated Vital Signs BP 113/84   Pulse 83   Temp 98.9 F (37.2 C) (Oral)   Resp 20   SpO2 100%   Physical Exam Vitals and nursing note reviewed.  Constitutional:      Appearance: He is well-developed.  HENT:     Head: Normocephalic and atraumatic.  Eyes:      Conjunctiva/sclera: Conjunctivae normal.  Cardiovascular:     Rate and Rhythm: Normal rate and regular rhythm.     Heart sounds: No murmur heard. Pulmonary:     Effort: Pulmonary effort is normal. No respiratory distress.     Breath sounds: Wheezing present.  Abdominal:     Palpations: Abdomen is soft.     Tenderness: There is no abdominal tenderness.  Musculoskeletal:     Cervical back: Neck supple.     Right lower leg: Edema present.  Skin:    General: Skin is warm and dry.     Capillary Refill: Capillary refill takes less than 2 seconds.  Neurological:     General: No focal deficit present.     Mental Status: He is alert and oriented to person, place, and time. Mental status is at baseline.     Cranial Nerves: No cranial nerve deficit.     Sensory: No sensory deficit.     Motor: No weakness.    ED Results / Procedures / Treatments   Labs (all labs ordered are listed, but only abnormal results are displayed) Labs Reviewed  CBC WITH DIFFERENTIAL/PLATELET - Abnormal; Notable for the following components:      Result Value   Hemoglobin 12.1 (*)    MCH 23.6 (*)    MCHC 25.9 (*)    RDW 17.5 (*)    Platelets 85 (*)    All other components within normal limits  COMPREHENSIVE METABOLIC PANEL - Abnormal; Notable for the following components:   Chloride 94 (*)    CO2 41 (*)    Glucose, Bld 102 (*)    Total Protein 6.2 (*)    Albumin 3.4 (*)    AST 53 (*)    All other components within normal limits  TROPONIN I (HIGH SENSITIVITY) - Abnormal; Notable for the following components:   Troponin I (High Sensitivity) 55 (*)    All other components within normal limits  TROPONIN I (HIGH SENSITIVITY) - Abnormal; Notable for  the following components:   Troponin I (High Sensitivity) 58 (*)    All other components within normal limits   EKG EKG Interpretation  Date/Time:  Monday February 13 2021 19:51:47 EDT Ventricular Rate:  99 PR Interval:  122 QRS Duration: 91 QT  Interval:  400 QTC Calculation: 441 R Axis:   261 Text Interpretation: Sinus rhythm Multiform ventricular premature complexes Probable right ventricular hypertrophy Inferior infarct, old No significant change since last tracing Confirmed by Alvira Monday ([id number]) on 02/13/2021 8:49:27 PM Also confirmed by Alvira Monday ([id number]), editor Jillene Bucks [phone]  on 02/14/2021 7:39:31 AM  Radiology DG Chest 2 View  Result Date: 02/13/2021 CLINICAL DATA:  Increased oxygen requirement EXAM: CHEST - 2 VIEW COMPARISON:  02/07/2021 FINDINGS: Hyperinflation without focal opacity, pleural effusion or pneumothorax. Borderline to mild cardiomegaly. Aortic atherosclerosis. No pneumothorax. Old left-sided rib fractures. IMPRESSION: Hyperinflation with emphysematous disease. No acute airspace disease. Borderline to mild cardiomegaly Electronically Signed   By: Jasmine Pang M.D.   On: 02/13/2021 19:13   CT Angio Chest PE W and/or Wo Contrast  Result Date: 02/13/2021 CLINICAL DATA:  Shortness of breath EXAM: CT ANGIOGRAPHY CHEST WITH CONTRAST TECHNIQUE: Multidetector CT imaging of the chest was performed using the standard protocol during bolus administration of intravenous contrast. Multiplanar CT image reconstructions and MIPs were obtained to evaluate the vascular anatomy. CONTRAST:  3mL OMNIPAQUE IOHEXOL 350 MG/ML SOLN COMPARISON:  Chest x-ray 02/14/2020, CT chest 01/18/2020, 05/13/2018 FINDINGS: Cardiovascular: Satisfactory opacification of the pulmonary arteries to the segmental level. No evidence of pulmonary embolism. Mild aortic atherosclerosis. No aneurysm. No dissection. Coronary vascular calcification. Cardiomegaly. No pericardial effusion. Pulmonary trunk appears enlarged. Mediastinum/Nodes: Midline trachea. No thyroid mass. No suspicious nodes. Esophagus within normal limits Lungs/Pleura: Emphysema. No acute airspace disease, pleural effusion, or pneumothorax. Upper Abdomen: No acute abnormality. Four metallic  densities are again visible within the stomach. Musculoskeletal: No chest wall abnormality. No acute or significant osseous findings. Review of the MIP images confirms the above findings. IMPRESSION: 1. Negative for acute pulmonary embolus or aortic dissection. 2. Emphysema without acute airspace disease 3. Slightly enlarged pulmonary trunk and pulmonary arteries suggestive of arterial hypertension. Aortic Atherosclerosis (ICD10-I70.0) and Emphysema (ICD10-J43.9). Electronically Signed   By: Jasmine Pang M.D.   On: 02/13/2021 22:46    Procedures Procedures   Medications Ordered in ED Medications  iohexol (OMNIPAQUE) 350 MG/ML injection 65 mL (65 mLs Intravenous Contrast Given 02/13/21 2234)    ED Course  I have reviewed the triage vital signs and the nursing notes.  Pertinent labs & imaging results that were available during my care of the patient were reviewed by me and considered in my medical decision making (see chart for details).    MDM Rules/Calculators/A&P                           65 y.o. male with past medical history as above who presents for evaluation of shortness of breath. Afebrile and hemodynamically stable. Exam as detailed above.    Labs notable for stable noncritical anemia.  CMP is notable for chronic retention with bicarbonate 41.  EKG is NSR.  Chest x-ray and CTA chest without acute abnormality.  Pulmonary edema or heart failure exacerbation considered; however, no clinical signs of volume overload, pitting edema, JVD, or crackles on auscultation. Pulmonary embolism considered, but CTA chest negative. Pneumothorax considered; however, lung sounds auscultated bilaterally and negative CXR.  Infectious pneumonia secondary to bacterial versus viral  URI could be etiology of symptoms, but patient denies any fever, chills, new cough, and negative CXR.  ACS considered; however, no acute ischemic changes on EKG as well as no chest pain verbalized in history that sounds typical for  ACS. Serial troponins are consistent with baseline troponinemia. Favor COPD exacerbation given obvious wheezing on exam, history of obstructive lung disease, and active smoking history.  Patient was prescribed a course of prednisone at discharge.   Final Clinical Impression(s) / ED Diagnoses Final diagnoses:  COPD exacerbation (HCC)    Rx / DC Orders ED Discharge Orders          Ordered    predniSONE (DELTASONE) 10 MG tablet  Daily        02/13/21 2342             Holley Dexter, MD 02/15/21 1345    Alvira Monday, MD 02/15/21 [phone]

## 2021-02-13 NOTE — ED Provider Notes (Signed)
Emergency Medicine Provider Triage Evaluation Note  Jared [name] , a 65 y.o. male  was evaluated in triage.  Pt complains of  shortness of breath. He reports that he was sweating but  no fevers.   States that this started pretty suddenly today.  He was discharged recently from the hospital.  He reports he has been taking his anticoagulants and not missed any doses.  He does have his right leg in a splint.  1 fire arrived he was on his baseline 2 L and was at 78%.  He is currently on 6 L and at 99%.  He denies any new pain.  He states that his sweating stopped when they increased his oxygen and he now feels better.  Review of Systems  Positive: Diaphoresis, new increased oxygen, shortness of breath Negative: syncope  Physical Exam  BP 108/85 (BP Location: Right Arm)   Pulse (!) 50   Temp 98.9 F (37.2 C) (Oral)   Resp 18   SpO2 100%  Gen:   Awake, no distress   Resp:  Normal effort  MSK:   Moves extremities without difficulty  Other:  Patient is awake and alert, answers questions appropriately.  Speech is not slurred.  Medical Decision Making  Medically screening exam initiated at 6:20 PM.  Appropriate orders placed.  Jared [name] was informed that the remainder of the evaluation will be completed by another provider, this initial triage assessment does not replace that evaluation, and the importance of remaining in the ED until their evaluation is complete.  Note: Portions of this report may have been transcribed using voice recognition software. Every effort was made to ensure accuracy; however, inadvertent computerized transcription errors may be present     Norman Clay 02/13/21 Vashti Hey, MD 02/14/21 [id number]

## 2021-02-13 NOTE — ED Triage Notes (Signed)
Pt from home via [facility], c/o SHOB. Pt hx COPD 2L [place] at baseline, acute onset SHOB today, 78% on 2L w GFD ->100% NRB, lung sounds diminished, titrated down to 5L [place] still feels SHOB, "something's not right."  138/82 HR 112 RR 24 99% 5L

## 2021-02-13 NOTE — ED Notes (Signed)
Pt also advises hx CHF

## 2021-02-14 ENCOUNTER — Telehealth: Payer: Self-pay | Admitting: Internal Medicine

## 2021-02-14 ENCOUNTER — Other Ambulatory Visit: Payer: Self-pay

## 2021-02-14 ENCOUNTER — Telehealth: Payer: Self-pay

## 2021-02-14 DIAGNOSIS — J9621 Acute and chronic respiratory failure with hypoxia: Secondary | ICD-10-CM

## 2021-02-14 DIAGNOSIS — J9622 Acute and chronic respiratory failure with hypercapnia: Secondary | ICD-10-CM

## 2021-02-14 DIAGNOSIS — J9611 Chronic respiratory failure with hypoxia: Secondary | ICD-10-CM

## 2021-02-14 NOTE — Telephone Encounter (Signed)
Transition Care Management Follow-up Telephone Call Date of discharge and from where: 02/11/2021, [facility] , was seen in ED 02/13/2021.  How have you been since you were released from the hospital?  He said he is feeling all right but continues to have problems with the regulator on his portable O2 tanks. He said that [facility] came out to the house but nothing changed. He said that the room O2 concentrator is working fine.  He has a home fill system and is able to fill his tanks but he does not think the O2 conserving device is working correctly yet.  He explained that he does not see the needle on the regulator move.  He has been using home O2 for many years and he said he understands how he should be feeling when he uses the O2. Informed him that this CM will contact [facility] about the O2. He said he uses the O2@4L  continuously. He has a NIV that he uses most nights.  Any questions or concerns? Yes noted above.   He also reports his " ears feeling full" and stated that this has been happening for about 2 months. Informed him that Dr Alvis Lemmings would be notified of his concern.  He is also interested in a POC.   Items Reviewed: Did the pt receive and understand the discharge instructions provided? Yes  Medications obtained and verified? Yes - he said he has all medications and did not have any questions about the med regime. He has a nebulizer.  Other? No  Any new allergies since your discharge? No  Dietary orders reviewed? No Do you have support at home?  Lives in a boarding house but receives help from his sister. He stated that she brings him meals.   Home Care and Equipment/Supplies: Were home health services ordered? no If so, what is the name of the agency? N/a  Has the agency set up a time to come to the patient's home? not applicable Were any new equipment or medical supplies ordered?  No What is the name of the medical supply agency? N/a Were you able to get the  supplies/equipment? not applicable Do you have any questions related to the use of the equipment or supplies? No    Functional Questionnaire: (I = Independent and D = Dependent) ADLs: independent and said he is staying in his room most of the time. He has a CAM boot for Right ankle fracture and is WBAT on that leg and uses RW with ambulation    Follow up appointments reviewed:  PCP Hospital f/u appt confirmed? Yes  Scheduled to see Dr Alvis Lemmings  on 02/21/2021.   Specialist Hospital f/u appt confirmed? Yes  Scheduled to see orthopedics - 02/16/2021 and pulmonary - 04/05/2021.  Are transportation arrangements needed? No  If their condition worsens, is the pt aware to call PCP or go to the Emergency Dept.? Yes Was the patient provided with contact information for the PCP's office or ED? Yes Was to pt encouraged to call back with questions or concerns? Yes   Call placed to [facility] regarding O2 problems.  Spoke to [place] who stated that they have received multiple calls regarding patient's O2 and have made multiple house calls to address these concerns. This CM explained that per the patient, the issue is not with the concentrator, it is the regulator on the tanks. She was not sure that the regulator for the tanks was addressed and will request a technician see the  patient and evaluate the equipment again

## 2021-02-14 NOTE — Telephone Encounter (Signed)
From the discharge call:  He said he is feeling all right but continues to have problems with the regulator on his portable O2 tanks. He said that [facility] came out to the house but nothing changed. He said that the room O2 concentrator is working fine.  He has a home fill system and is able to fill his tanks but he does not think the O2 conserving device is working correctly yet.  He explained that he does not see the needle on the regulator move.  He has been using home O2 for many years and he said he understands how he should be feeling when he uses the O2. Informed him that this CM will contact [facility] about the O2. He said he uses the O2@4L  continuously. He has a NIV that he uses most nights.    He also reports his " ears feeling full" and stated that this has been happening for about 2 months. Informed him that Dr Alvis Lemmings would be notified of his concern.   He is also interested in a POC.   he is staying in his room most of the time. He has a CAM boot for Right ankle fracture, is WBAT on that leg and uses RW with ambulation  Scheduled to see Dr Alvis Lemmings  on 02/21/2021   Call placed to [facility] regarding O2 problems.  Spoke to [place] who stated that they have received multiple calls regarding patient's O2 and have made multiple house calls to address these concerns. This CM explained that per the patient, the issue is not with the concentrator, it is the regulator on the tanks. She was not sure that the regulator for the tanks was addressed and will request a technician see the patient and evaluate the equipment again

## 2021-02-14 NOTE — Telephone Encounter (Signed)
ATC x1, no answer, left vm.

## 2021-02-15 NOTE — Telephone Encounter (Signed)
noted 

## 2021-02-15 NOTE — Telephone Encounter (Signed)
Pt calling back and would like inogen portable concentrator order sent to [facility]. [phone]

## 2021-02-15 NOTE — Telephone Encounter (Signed)
Will address at office visit

## 2021-02-16 ENCOUNTER — Other Ambulatory Visit: Payer: Self-pay

## 2021-02-16 ENCOUNTER — Ambulatory Visit (INDEPENDENT_AMBULATORY_CARE_PROVIDER_SITE_OTHER): Payer: Medicare HMO

## 2021-02-16 ENCOUNTER — Ambulatory Visit (INDEPENDENT_AMBULATORY_CARE_PROVIDER_SITE_OTHER): Payer: Medicare HMO | Admitting: Orthopaedic Surgery

## 2021-02-16 ENCOUNTER — Encounter: Payer: Self-pay | Admitting: Orthopaedic Surgery

## 2021-02-16 DIAGNOSIS — S8261XA Displaced fracture of lateral malleolus of right fibula, initial encounter for closed fracture: Secondary | ICD-10-CM

## 2021-02-16 DIAGNOSIS — M25571 Pain in right ankle and joints of right foot: Secondary | ICD-10-CM

## 2021-02-16 MED ORDER — HYDROCODONE-ACETAMINOPHEN 5-325 MG PO TABS: 1.0000 | ORAL_TABLET | Freq: Four times a day (QID) | ORAL | 0 refills | Status: DC | PRN

## 2021-02-16 NOTE — Progress Notes (Signed)
++++  Office Visit Note   Patient: Jared [name]           Date of Birth: [birth year]           MRN: [id number] Visit Date: 02/16/2021              Requested by: Zannie Cove, MD [address] [place],  [place] [address] PCP: [name], MD   Assessment & Plan: Visit Diagnoses:  1. Pain in right ankle and joints of right foot   2. Displaced fracture of lateral malleolus of right fibula, initial encounter for closed fracture     Plan: This appears to be a stable right ankle lateral malleolus fracture a week out from his injury.  He has been weightbearing as tolerated in the cam walking boot and thus far the fracture remained stable.  He will continue weightbearing as tolerated in the cam walking boot I will see him back in 4 weeks with repeat 3 views of the right ankle.  I will send in some hydrocodone for pain.  All questions and concerns were answered and addressed.  Follow-Up Instructions: Return in about 4 weeks (around 03/16/2021).   Orders:  Orders Placed This Encounter  Procedures   XR Ankle Complete Right   Meds ordered this encounter  Medications   HYDROcodone-acetaminophen (NORCO/VICODIN) 5-325 MG tablet    Sig: Take 1-2 tablets by mouth every 6 (six) hours as needed for moderate pain.    Dispense:  30 tablet    Refill:  0      Procedures: No procedures performed   Clinical Data: No additional findings.   Subjective: Chief Complaint  Patient presents with   Right Ankle - Injury  The patient is referral to our clinic a week out from a right ankle injury.  He is 65 years old.  He had a syncopal episode and injured his right ankle on 02/07/2021.  He was placed in a cam walking boot having had a stable lateral malleolus fracture of the right ankle.  He is someone who is on home oxygen as well.  Since that injury he has been to the ER with a swollen knee and there is x-rays were negative other than an effusion.  He said that is going down with his right  knee.  He also had a CTA to rule out a PE.  He has chronic emphysema and again is on chronic oxygen.  I believe he is on Xarelto as well.  He does report some moderate right ankle pain.  He has been compliant with wearing the boot on the right ankle.  HPI  Review of Systems There is no listed fever, chills, nausea, vomiting  Objective: Vital Signs: There were no vitals taken for this visit.  Physical Exam He is alert and orient x3 and in no acute distress Ortho Exam Examination of the right ankle shows global bruising and swelling.  The ankle is clinically well located and his foot is well-perfused with normal pulse and good sensation. Specialty Comments:  No specialty comments available.  Imaging: No results found.   PMFS History: Patient Active Problem List   Diagnosis Date Noted   Closed fracture of distal end of fibula, unspecified fracture morphology, initial encounter    Pre-syncope    Syncope and collapse 02/07/2021   Elevated troponin 01/30/2021   History of GI bleed    Gastric ulcer due to Helicobacter pylori and nonsteroidal anti-inflammatory drug (NSAID)    AKI (  acute kidney injury) (HCC) 11/18/2020   Epigastric pain    Acute gastric ulcer with hemorrhage    Acute blood loss anemia    Acute upper GI bleed 11/12/2020   Hematemesis with nausea    Acute GI bleeding 06/11/2019   Syncope 06/11/2019   Chronic right maxillary sinusitis 01/29/2019   Chronic respiratory failure with hypoxia and hypercapnia (HCC) 01/01/2019   Acute on chronic respiratory failure with hypoxia and hypercapnia (HCC) 11/10/2018   GI bleed 10/27/2018   Chronic anticoagulation 09/27/2018   AV malformation of gastrointestinal tract    Occult GI bleeding    Heme positive stool    Iron deficiency anemia    Acute on chronic anemia 09/04/2018   Noncompliance 06/14/2017   Elevated LFTs 08/13/2016   Chronic respiratory failure with hypoxia (HCC) 07/24/2016   Atrial flutter (HCC)    Hepatic  congestion 07/02/2016   Acute on chronic combined systolic and diastolic CHF (congestive heart failure) (HCC) 07/01/2016   COPD with acute exacerbation (HCC) 06/03/2016   Prediabetes 05/14/2016   Hemoptysis 05/06/2016   COPD GOLD III/still smoking  02/29/2016   Cigarette smoker 01/09/2016   CAD (coronary artery disease) 10/07/2013   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 LHC 5/12:  LAD 20, pLCx 20, pRCA 40, dRCA 40, EF 35%, diff HK  //  b. Myoview 4/16: Overall Impression:  High risk stress nuclear study There is no evidence of ischemia.  There is severe LV dysfunction. LV Ejection Fraction: 30%.  LV Wall Motion:  There is global LV hypokinesis.     CAP (community acquired pneumonia) 09/2013   Chronic combined systolic and diastolic CHF (congestive heart failure) (HCC)    a. Echo 4/16:Mild LVH, EF 40-45%, diffuse HK //  b. Echo 8/17: EF 35-40%, diffuse HK, diastolic dysfunction, aortic sclerosis, trivial MR, moderate LAE, normal RVSF, moderate RAE, mild TR, PASP 42 mmHg // c. Echo 4/18: Mild concentric LVH, EF 30-35, normal wall motion, grade 1 diastolic dysfunction, PASP 49   Chronic respiratory failure (HCC)    Cluster headache    "hx; haven't had one in awhile" (01/09/2016)   COPD (chronic obstructive pulmonary disease) (HCC)    [name] 01/09/2016   History of CVA (cerebrovascular accident)    Hypertension    IDA (iron deficiency anemia)    Moderate tobacco use disorder    NICM (nonischemic cardiomyopathy) (HCC)    Nicotine addiction    Tobacco abuse     Family History  Problem Relation Age of Onset   Heart disease Mother    Diabetes Mother    Colon cancer Mother    Liver cancer Mother    Cancer Father        type unknown   Diabetes Sister        x 2   Diabetes Brother     Past  Surgical History:  Procedure Laterality Date   BIOPSY  11/12/2020   Procedure: BIOPSY;  Surgeon: Lemar Lofty., MD;  Location: [facility] ENDOSCOPY;  Service: Gastroenterology;;   CARDIAC CATHETERIZATION  10/2010   LM normal, LAD with 20% irregularities, LCX with 20%, RCA with 40% prox and 40% distal - EF of 35%   CATARACT EXTRACTION, BILATERAL     COLONOSCOPY W/ BIOPSIES AND POLYPECTOMY     COLONOSCOPY WITH PROPOFOL  N/A 09/06/2018   Procedure: COLONOSCOPY WITH PROPOFOL;  Surgeon: Tressia Danas, MD;  Location: [facility] ENDOSCOPY;  Service: Gastroenterology;  Laterality: N/A;   ENTEROSCOPY N/A 09/28/2018   Procedure: ENTEROSCOPY;  Surgeon: Jeani Hawking, MD;  Location: [facility] ENDOSCOPY;  Service: Endoscopy;  Laterality: N/A;   ENTEROSCOPY N/A 10/28/2018   Procedure: ENTEROSCOPY;  Surgeon: Tressia Danas, MD;  Location: [facility] ENDOSCOPY;  Service: Gastroenterology;  Laterality: N/A;   ENTEROSCOPY N/A 10/09/2020   Procedure: ENTEROSCOPY;  Surgeon: Hilarie Fredrickson, MD;  Location: [facility] ENDOSCOPY;  Service: Endoscopy;  Laterality: N/A;   ESOPHAGOGASTRODUODENOSCOPY N/A 11/12/2020   Procedure: ESOPHAGOGASTRODUODENOSCOPY (EGD);  Surgeon: Lemar Lofty., MD;  Location: [facility] ENDOSCOPY;  Service: Gastroenterology;  Laterality: N/A;   ESOPHAGOGASTRODUODENOSCOPY (EGD) WITH PROPOFOL N/A 09/05/2018   Procedure: ESOPHAGOGASTRODUODENOSCOPY (EGD) WITH PROPOFOL;  Surgeon: Benancio Deeds, MD;  Location[facility] ENDOSCOPY;  Service: Gastroenterology;  Laterality: N/A;   ESOPHAGOGASTRODUODENOSCOPY (EGD) WITH PROPOFOL N/A 11/19/2020   Procedure: ESOPHAGOGASTRODUODENOSCOPY (EGD) WITH PROPOFOL;  Surgeon: Beverley Fiedler, MD;  Location: [facility] ENDOSCOPY;  Service: Gastroenterology;  Laterality: N/A;   EXCISION MASS HEAD     GIVENS CAPSULE STUDY N/A 09/06/2018   Procedure: GIVENS CAPSULE STUDY;  Surgeon: Tressia Danas, MD;  Location: [facility] ENDOSCOPY;  Service: Gastroenterology;  Laterality: N/A;   GIVENS CAPSULE STUDY N/A 09/26/2018    Procedure: GIVENS CAPSULE STUDY;  Surgeon: Beverley Fiedler, MD;  Location: [facility] ENDOSCOPY;  Service: Gastroenterology;  Laterality: N/A;   GIVENS CAPSULE STUDY N/A 06/14/2019   Procedure: GIVENS CAPSULE STUDY;  Surgeon: Napoleon Form, MD;  Location: [facility] ENDOSCOPY;  Service: Endoscopy;  Laterality: N/A;   HEMOSTASIS CLIP PLACEMENT  11/12/2020   Procedure: HEMOSTASIS CLIP PLACEMENT;  Surgeon: Lemar Lofty., MD;  Location: [facility] ENDOSCOPY;  Service: Gastroenterology;;   HEMOSTASIS CONTROL  11/12/2020   Procedure: HEMOSTASIS CONTROL;  Surgeon: Lemar Lofty., MD;  Location: [facility] ENDOSCOPY;  Service: Gastroenterology;;   HOT HEMOSTASIS N/A 10/28/2018   Procedure: HOT HEMOSTASIS (ARGON PLASMA COAGULATION/BICAP);  Surgeon: Tressia Danas, MD;  Location: [facility] ENDOSCOPY;  Service: Gastroenterology;  Laterality: N/A;   HOT HEMOSTASIS N/A 11/12/2020   Procedure: HOT HEMOSTASIS (ARGON PLASMA COAGULATION/BICAP);  Surgeon: Lemar Lofty., MD;  Location: [facility] ENDOSCOPY;  Service: Gastroenterology;  Laterality: N/A;   INCISION AND DRAINAGE PERIRECTAL ABSCESS N/A 06/05/2017   Procedure: IRRIGATION AND DEBRIDEMENT PERIRECTAL ABSCESS;  Surgeon: Andria Meuse, MD;  Location: [facility] OR;  Service: General;  Laterality: N/A;   SUBMUCOSAL TATTOO INJECTION  11/12/2020   Procedure: SUBMUCOSAL TATTOO INJECTION;  Surgeon: Lemar Lofty., MD;  Location: [facility] ENDOSCOPY;  Service: Gastroenterology;;   VIDEO BRONCHOSCOPY Bilateral 05/08/2016   Procedure: VIDEO BRONCHOSCOPY WITH FLUORO;  Surgeon: Oretha Milch, MD;  Location: [facility] ENDOSCOPY;  Service: Cardiopulmonary;  Laterality: Bilateral;   Social History   Occupational History   Occupation: retired  Tobacco Use   Smoking status: Some Days    Packs/day: 2.00    Years: 47.00    Pack years: 94.00    Types: Cigarettes   Smokeless tobacco: Never  Vaping Use   Vaping Use: Never used  Substance and Sexual Activity   Alcohol use: Not Currently     Alcohol/week: 0.0 standard drinks    Comment: last drink was before xmas   Drug use: No    Types: Cocaine, Marijuana    Comment: "nothing in 20 years"   Sexual activity: Not Currently

## 2021-02-21 ENCOUNTER — Telehealth: Payer: Self-pay | Admitting: Family Medicine

## 2021-02-21 ENCOUNTER — Other Ambulatory Visit: Payer: Self-pay

## 2021-02-21 ENCOUNTER — Encounter: Payer: Self-pay | Admitting: Family Medicine

## 2021-02-21 ENCOUNTER — Ambulatory Visit: Payer: Medicare HMO | Attending: Family Medicine | Admitting: Family Medicine

## 2021-02-21 DIAGNOSIS — J449 Chronic obstructive pulmonary disease, unspecified: Secondary | ICD-10-CM | POA: Diagnosis not present

## 2021-02-21 DIAGNOSIS — I5042 Chronic combined systolic (congestive) and diastolic (congestive) heart failure: Secondary | ICD-10-CM

## 2021-02-21 DIAGNOSIS — J9612 Chronic respiratory failure with hypercapnia: Secondary | ICD-10-CM

## 2021-02-21 DIAGNOSIS — K250 Acute gastric ulcer with hemorrhage: Secondary | ICD-10-CM

## 2021-02-21 DIAGNOSIS — I110 Hypertensive heart disease with heart failure: Secondary | ICD-10-CM | POA: Diagnosis not present

## 2021-02-21 DIAGNOSIS — S82891D Other fracture of right lower leg, subsequent encounter for closed fracture with routine healing: Secondary | ICD-10-CM | POA: Diagnosis not present

## 2021-02-21 DIAGNOSIS — J9611 Chronic respiratory failure with hypoxia: Secondary | ICD-10-CM

## 2021-02-21 DIAGNOSIS — I4892 Unspecified atrial flutter: Secondary | ICD-10-CM

## 2021-02-21 MED ORDER — RIVAROXABAN 20 MG PO TABS
20.0000 mg | ORAL_TABLET | Freq: Every day | ORAL | 1 refills | Status: DC
Start: 2021-02-21 — End: 2021-06-08
  Filled 2021-02-21: qty 90, 90d supply, fill #0

## 2021-02-21 NOTE — Progress Notes (Signed)
Virtual Visit via Telephone Note  I connected with Jared [name], on 02/21/2021 at 11:11 AM by telephone due to the COVID-19 pandemic and verified that I am speaking with the correct person using two identifiers.   Consent: I discussed the limitations, risks, security and privacy concerns of performing an evaluation and management service by telephone and the availability of in person appointments. I also discussed with the patient that there may be a patient responsible charge related to this service. The patient expressed understanding and agreed to proceed.   Location of Patient: Home  Location of Provider: Clinic   Persons participating in Telemedicine visit: [facility] Dr. Alvis Lemmings     History of Present Illness: Jared [name] is a 65 y.o. 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 He is seen today for transitional care visit after hospitalization for COPD exacerbation and 02/13/2021 through 02/14/2021.  Prior to that he was hospitalized for presyncope versus syncope and right ankle fracture from 8/30 to 02/11/2021.  States his Oxygen tank was not working well which led to his dyspnea and subsequent presentation to the hospital.  Today he reports doing well and has enough supplies of his oxygen tank from his DME company but would like to have a portable oxygen concentrator as the current tanks he has are heavy. He sees Pulmonary on 03/06/21. States he is at his baseline.  He has no chest pains or worsening dyspnea, no pedal edema, no hematochezia.  Compliant with Xarelto.  He has no additional concerns. Currently wearing a cam walker and has been to see his orthopedic Dr. Magnus Ivan for follow-up of his  right ankle fracture. Past Medical History:  Diagnosis Date   AVM (arteriovenous malformation)    CAD (coronary artery disease)    a. LHC 5/12:  LAD 20, pLCx 20, pRCA 40, dRCA 40, EF 35%, diff HK  //  b. Myoview 4/16: Overall Impression:  High risk stress nuclear study There is no evidence of ischemia.  There is severe LV dysfunction. LV Ejection Fraction: 30%.  LV Wall Motion:  There is global LV hypokinesis.     CAP (community acquired pneumonia) 09/2013   Chronic combined systolic and diastolic CHF (congestive heart failure) (HCC)    a. Echo 4/16:Mild LVH, EF 40-45%, diffuse HK //  b. Echo 8/17: EF 35-40%, diffuse HK, diastolic dysfunction, aortic sclerosis, trivial MR, moderate LAE, normal RVSF, moderate RAE, mild TR, PASP 42 mmHg // c. Echo 4/18: Mild concentric LVH, EF 30-35, normal wall motion, grade 1 diastolic dysfunction, PASP 49   Chronic respiratory failure (HCC)    Cluster headache    "hx; haven't had one in awhile" (01/09/2016)   COPD (chronic obstructive pulmonary disease) (HCC)    [name] 01/09/2016   History of CVA (cerebrovascular accident)    Hypertension    IDA (iron deficiency anemia)    Moderate tobacco use disorder    NICM (nonischemic cardiomyopathy) (HCC)    Nicotine addiction    Tobacco abuse    Allergies  Allergen Reactions   Lisinopril Cough    Current Outpatient Medications on File Prior to Visit  Medication Sig Dispense Refill   albuterol (PROVENTIL) (2.5 MG/3ML) 0.083% nebulizer solution Take 3 mLs (2.5 mg total) by nebulization  every 4 (four) hours as needed for wheezing or shortness of breath. 75 mL 0   albuterol (VENTOLIN HFA) 108 (90 Base) MCG/ACT inhaler INHALE 2 PUFFS INTO THE LUNGS EVERY 6 (SIX) HOURS AS NEEDED FOR WHEEZING OR SHORTNESS OF BREATH. 18 g 5   atorvastatin (LIPITOR) 80 MG tablet TAKE 1 TABLET EVERY DAY (Patient taking differently: Take 80 mg by mouth daily.) 90 tablet 0   Budeson-Glycopyrrol-Formoterol 160-9-4.8 MCG/ACT AERO INHALE 2  PUFFS INTO THE LUNGS 2 (TWO) TIMES DAILY. 10.7 g 11   ferrous sulfate 325 (65 FE) MG tablet Take 1 tablet (325 mg total) by mouth 2 (two) times daily with a meal. 60 tablet 1   fluticasone (FLONASE) 50 MCG/ACT nasal spray Place 1 spray into both nostrils daily. 16 g 1   furosemide (LASIX) 40 MG tablet TAKE 1 TABLET EVERY DAY (Patient taking differently: Take 40 mg by mouth daily.) 90 tablet 0   gabapentin (NEURONTIN) 300 MG capsule TAKE 1 CAPSULE (300 MG TOTAL) BY MOUTH AT BEDTIME. (Patient taking differently: Take 300 mg by mouth at bedtime.) 30 capsule 6   HYDROcodone-acetaminophen (NORCO/VICODIN) 5-325 MG tablet Take 1-2 tablets by mouth every 6 (six) hours as needed for moderate pain. 30 tablet 0   Misc. Devices MISC Inhale 3 L into the lungs as needed. Portable oxygen concentrator. Diagnosis COPD.     Multiple Vitamin (MULTIVITAMIN WITH MINERALS) TABS tablet Take 1 tablet by mouth daily.     nitroGLYCERIN (NITROSTAT) 0.4 MG SL tablet Place 1 tablet (0.4 mg total) under the tongue every 5 (five) minutes as needed for chest pain.     pantoprazole (PROTONIX) 40 MG tablet Take 1 tablet (40 mg total) by mouth daily.     traMADol (ULTRAM) 50 MG tablet Take 1 tablet (50 mg total) by mouth every 6 (six) hours as needed for moderate pain. 30 tablet 0   XARELTO 20 MG TABS tablet TAKE 1 TABLET EVERY DAY WITH SUPPER (Patient taking differently: Take 20 mg by mouth daily with supper.) 90 tablet 0   [DISCONTINUED] TUDORZA PRESSAIR 400 MCG/ACT AEPB INHALE 1 PUFF INTO THE LUNGS IN THE MORNING AND AT BEDTIME. 1 each 11   No current facility-administered medications on file prior to visit.    ROS: See HPI  Observations/Objective: Awake, alert, oriented x3 Not in acute distress Normal mood  CMP Latest Ref Rng & Units 02/13/2021 02/11/2021 02/10/2021  Glucose 70 - 99 mg/dL 160(F) 83 99  BUN 8 - 23 mg/dL 13 12 10   Creatinine 0.61 - 1.24 mg/dL 0.93 2.35  Sodium 135 - 145 mmol/L 142 141 138  Potassium 3.5  - 5.1 mmol/L 4.2 4.1 4.2  Chloride 98 - 111 mmol/L 94(L) 88(L) 88(L)  CO2 22 - 32 mmol/L 41(H) 45(H) 45(H)  Calcium 8.9 - 10.3 mg/dL 9.0 9.1 8.9  Total Protein 6.5 - 8.1 g/dL 6.2(L) - -  Total Bilirubin 0.3 - 1.2 mg/dL 1.0 - -  Alkaline Phos 38 - 126 U/L 58 - -  AST 15 - 41 U/L 53(H) - -  ALT 0 - 44 U/L 25 - -    Lipid Panel     Component Value Date/Time   CHOL 148 01/29/2020 1020   TRIG 138 01/29/2020 1020   HDL 51 01/29/2020 1020   CHOLHDL 2.9 01/29/2020 1020   CHOLHDL 2.8 01/11/2016 0446   VLDL 14 01/11/2016 0446   LDLCALC 73 01/29/2020 1020   LABVLDL 24 01/29/2020 1020    Lab Results  Component Value Date   HGBA1C 5.7 (H) 11/15/2020    Assessment and Plan: 1. COPD GOLD III/still smoking  Currently on oxygen therapy Exacerbation has resolved Continue inhalers Follow-up with pulmonary  2. Chronic respiratory failure with hypoxia and hypercapnia (HCC) Currently on 3 L of oxygen Will attempt to obtain a portable oxygen concentrator for him Message sent to RN case manager to assist with portable oxygen concentrator from his DME company  3. Acute gastric ulcer with hemorrhage Hematochezia has resolved Continue with PPI Followed by GI  4. Hypertensive heart disease with chronic combined systolic and diastolic congestive heart failure (HCC) EF of 40 to 45% Euvolemic  5. Closed fracture of right ankle with routine healing, subsequent encounter Currently with cam walker and he is partial weightbearing Follow-up with orthopedic  6. Atrial flutter, unspecified type (HCC) Stable - rivaroxaban (XARELTO) 20 MG TABS tablet; Take 1 tablet (20 mg total) by mouth daily with supper.  Dispense: 90 tablet; Refill: 1   Follow Up Instructions: 3 months   I discussed the assessment and treatment plan with the patient. The patient was provided an opportunity to ask questions and all were answered. The patient agreed with the plan and demonstrated an understanding of the  instructions.   The patient was advised to call back or seek an in-person evaluation if the symptoms worsen or if the condition fails to improve as anticipated.     I provided 12 minutes total of non-face-to-face time during this encounter.   [name], MD, FAAFP. [facility] [place], [place] [phone]   02/21/2021, 11:11 AM

## 2021-02-21 NOTE — Telephone Encounter (Signed)
Can you assist with portable oxygen concentrator for this patient?  Thank you

## 2021-02-23 ENCOUNTER — Other Ambulatory Visit: Payer: Self-pay

## 2021-02-23 DIAGNOSIS — I5043 Acute on chronic combined systolic (congestive) and diastolic (congestive) heart failure: Secondary | ICD-10-CM | POA: Diagnosis not present

## 2021-02-23 MED ORDER — MISC. DEVICES MISC: 3.0000 L | 0 refills | Status: DC | PRN

## 2021-02-23 NOTE — Telephone Encounter (Signed)
Can you please reprint the order from his chart and I will sign when I am back in the office? Thanks

## 2021-02-23 NOTE — Telephone Encounter (Signed)
Call placed to [employer] regarding POC, spoke to [place] who said that they just need an order for a POC and they will schedule patient for an evaluation to make sure he can tolerate it.   Call placed to patient and informed him of above.  He also confirmed that [employer] has repaired the regulator for his O2 tanks.

## 2021-02-23 NOTE — Telephone Encounter (Signed)
Done

## 2021-02-28 ENCOUNTER — Other Ambulatory Visit: Payer: Self-pay

## 2021-02-28 MED FILL — Budesonide-Glycopyrrolate-Formoterol Aers 160-9-4.8 MCG/ACT: RESPIRATORY_TRACT | 30 days supply | Qty: 10.7 | Fill #5 | Status: AC

## 2021-03-01 ENCOUNTER — Other Ambulatory Visit: Payer: Self-pay

## 2021-03-04 DIAGNOSIS — J449 Chronic obstructive pulmonary disease, unspecified: Secondary | ICD-10-CM | POA: Diagnosis not present

## 2021-03-04 DIAGNOSIS — J9610 Chronic respiratory failure, unspecified whether with hypoxia or hypercapnia: Secondary | ICD-10-CM | POA: Diagnosis not present

## 2021-03-06 NOTE — Telephone Encounter (Signed)
Spoke with the pt  He states wanting POC from [employer]  He is currently using small etanks, but they are still heavy  Can we put in for best fit for him? Thanks

## 2021-03-06 NOTE — Telephone Encounter (Signed)
Ok to refer for best fit for ambulatory 02

## 2021-03-06 NOTE — Telephone Encounter (Signed)
Order has been placed for best fit

## 2021-03-14 ENCOUNTER — Ambulatory Visit: Payer: Medicare HMO

## 2021-03-15 ENCOUNTER — Ambulatory Visit (INDEPENDENT_AMBULATORY_CARE_PROVIDER_SITE_OTHER): Payer: Medicare HMO | Admitting: Orthopaedic Surgery

## 2021-03-15 ENCOUNTER — Ambulatory Visit: Payer: Medicare HMO | Attending: Family Medicine

## 2021-03-15 ENCOUNTER — Encounter: Payer: Self-pay | Admitting: Orthopaedic Surgery

## 2021-03-15 ENCOUNTER — Ambulatory Visit: Payer: Self-pay

## 2021-03-15 ENCOUNTER — Other Ambulatory Visit: Payer: Self-pay

## 2021-03-15 DIAGNOSIS — M1711 Unilateral primary osteoarthritis, right knee: Secondary | ICD-10-CM | POA: Diagnosis not present

## 2021-03-15 DIAGNOSIS — Z23 Encounter for immunization: Secondary | ICD-10-CM

## 2021-03-15 DIAGNOSIS — S8261XA Displaced fracture of lateral malleolus of right fibula, initial encounter for closed fracture: Secondary | ICD-10-CM

## 2021-03-15 DIAGNOSIS — M25561 Pain in right knee: Secondary | ICD-10-CM | POA: Diagnosis not present

## 2021-03-15 DIAGNOSIS — M25571 Pain in right ankle and joints of right foot: Secondary | ICD-10-CM

## 2021-03-15 DIAGNOSIS — G8929 Other chronic pain: Secondary | ICD-10-CM | POA: Diagnosis not present

## 2021-03-15 MED ORDER — LIDOCAINE HCL 1 % IJ SOLN
3.0000 mL | INTRAMUSCULAR | Status: AC | PRN
  Administered 2021-03-15: 3 mL

## 2021-03-15 MED ORDER — METHYLPREDNISOLONE ACETATE 40 MG/ML IJ SUSP
40.0000 mg | INTRAMUSCULAR | Status: AC | PRN
  Administered 2021-03-15: 40 mg via INTRA_ARTICULAR

## 2021-03-15 NOTE — Progress Notes (Signed)
Office Visit Note   Patient: Jared [name]           Date of Birth: [birth year]           MRN: [id number] Visit Date: 03/15/2021              Requested by: [name], MD [address] [place],  [place] [address] PCP: [name], MD   Assessment & Plan: Visit Diagnoses:  1. Pain in right ankle and joints of right foot   2. Displaced fracture of lateral malleolus of right fibula, initial encounter for closed fracture   3. Chronic pain of right knee   4. Unilateral primary osteoarthritis, right knee     Plan: I did talk to the patient in length in detail today about his right knee.  After 5 cc of lidocaine I was able to aspirate about 20 cc of clear yellow fluid from the knee.  I then placed a steroid injection in the knee and we will see how this helps him.  From an ankle standpoint, he will continue weightbearing as tolerated and we will see him back in 4 weeks for potentially final visit for the right ankle.  We will have just a single mortise view of the ankle at that visit.  Follow-Up Instructions: Return in about 4 weeks (around 04/12/2021).   Orders:  Orders Placed This Encounter  Procedures   Large Joint Inj   XR Ankle Complete Right   No orders of the defined types were placed in this encounter.     Procedures: Large Joint Inj: R knee on 03/15/2021 10:55 AM Indications: diagnostic evaluation and pain Details: 22 G 1.5 in needle, superolateral approach  Arthrogram: No  Medications: 3 mL lidocaine 1 %; 40 mg methylPREDNISolone acetate 40 MG/ML Outcome: tolerated well, no immediate complications Procedure, treatment alternatives, risks and benefits explained, specific risks discussed. Consent was given by the patient. Immediately prior to procedure a time out was called to verify the correct patient, procedure, equipment, support staff and site/side marked as required. Patient was prepped and draped in the usual sterile fashion.      Clinical Data: No  additional findings.   Subjective: Chief Complaint  Patient presents with   Right Ankle - Fracture, Follow-up  The patient is now about 6 weeks into a right ankle lateral malleolus fracture.  He says the ankle is doing okay but he still having a lot of pain in his right knee.  He is on oxygen due to severe COPD.  He is still walking with a limp and states most of his pain is with his right knee and he points to the medial aspect of his right knee as a source of his pain.  Previous x-rays of the knee show tricompartment arthritis with mainly medial joint space narrowing and this is where he points to.  He is walking in regular shoes now and not wearing a boot.  HPI  Review of Systems He currently denies any chest pain.  He denies any fever, chills, nausea, vomiting  Objective: Vital Signs: There were no vitals taken for this visit.  Physical Exam He is alert and orient x3 and in no acute distress Ortho Exam Examination of his right knee does show mild effusion with medial joint line tenderness.  Examination of the right ankle shows tenderness over the lateral malleolus with swelling.  The ankle is well located with pretty good range of motion. Specialty Comments:  No specialty comments available.  Imaging: XR Ankle Complete Right  Result Date: 03/15/2021 3 views of the right ankle show an intact ankle mortise.  There is a lateral malleolus fracture that is nondisplaced and in good alignment.  There has been interval healing when compared to previous films.    PMFS History: Patient Active Problem List   Diagnosis Date Noted   Closed fracture of distal end of fibula, unspecified fracture morphology, initial encounter    Pre-syncope    Syncope and collapse 02/07/2021   Elevated troponin 01/30/2021   History of GI bleed    Gastric ulcer due to Helicobacter pylori and nonsteroidal anti-inflammatory drug (NSAID)    AKI (acute kidney injury) (HCC) 11/18/2020   Epigastric pain     Acute gastric ulcer with hemorrhage    Acute blood loss anemia    Acute upper GI bleed 11/12/2020   Hematemesis with nausea    Acute GI bleeding 06/11/2019   Syncope 06/11/2019   Chronic right maxillary sinusitis 01/29/2019   Chronic respiratory failure with hypoxia and hypercapnia (HCC) 01/01/2019   Acute on chronic respiratory failure with hypoxia and hypercapnia (HCC) 11/10/2018   GI bleed 10/27/2018   Chronic anticoagulation 09/27/2018   AV malformation of gastrointestinal tract    Occult GI bleeding    Heme positive stool    Iron deficiency anemia    Acute on chronic anemia 09/04/2018   Noncompliance 06/14/2017   Elevated LFTs 08/13/2016   Chronic respiratory failure with hypoxia (HCC) 07/24/2016   Atrial flutter (HCC)    Hepatic congestion 07/02/2016   Acute on chronic combined systolic and diastolic CHF (congestive heart failure) (HCC) 07/01/2016   COPD with acute exacerbation (HCC) 06/03/2016   Prediabetes 05/14/2016   Hemoptysis 05/06/2016   COPD GOLD III/still smoking  02/29/2016   Cigarette smoker 01/09/2016   CAD (coronary artery disease) 10/07/2013   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 LHC 5/12:  LAD 20, pLCx 20, pRCA 40, dRCA 40, EF 35%, diff HK  //  b. Myoview 4/16: Overall Impression:  High risk stress nuclear study There is no evidence of ischemia.  There is severe LV dysfunction. LV Ejection Fraction: 30%.  LV Wall Motion:  There is global LV hypokinesis.     CAP (community acquired pneumonia) 09/2013   Chronic combined systolic and diastolic CHF (congestive heart failure) (HCC)    a. Echo 4/16:Mild LVH, EF 40-45%, diffuse HK //  b. Echo 8/17: EF 35-40%, diffuse HK, diastolic dysfunction, aortic sclerosis, trivial MR, moderate LAE, normal RVSF, moderate RAE, mild  TR, PASP 42 mmHg // c. Echo 4/18: Mild concentric LVH, EF 30-35, normal wall motion, grade 1 diastolic dysfunction, PASP 49   Chronic respiratory failure (HCC)    Cluster headache    "hx; haven't had one in awhile" (01/09/2016)   COPD (chronic obstructive pulmonary disease) (HCC)    [name] 01/09/2016   History of CVA (cerebrovascular accident)    Hypertension    IDA (iron deficiency anemia)    Moderate tobacco use disorder    NICM (nonischemic cardiomyopathy) (HCC)    Nicotine addiction    Tobacco abuse     Family History  Problem Relation Age of Onset   Heart disease Mother    Diabetes Mother    Colon cancer Mother    Liver cancer Mother  Cancer Father        type unknown   Diabetes Sister        x 2   Diabetes Brother     Past Surgical History:  Procedure Laterality Date   BIOPSY  11/12/2020   Procedure: BIOPSY;  Surgeon: Lemar Lofty., MD;  Location: [facility] ENDOSCOPY;  Service: Gastroenterology;;   CARDIAC CATHETERIZATION  10/2010   LM normal, LAD with 20% irregularities, LCX with 20%, RCA with 40% prox and 40% distal - EF of 35%   CATARACT EXTRACTION, BILATERAL     COLONOSCOPY W/ BIOPSIES AND POLYPECTOMY     COLONOSCOPY WITH PROPOFOL N/A 09/06/2018   Procedure: COLONOSCOPY WITH PROPOFOL;  Surgeon: Tressia Danas, MD;  Location: [facility] ENDOSCOPY;  Service: Gastroenterology;  Laterality: N/A;   ENTEROSCOPY N/A 09/28/2018   Procedure: ENTEROSCOPY;  Surgeon: Jeani Hawking, MD;  Location: [facility] ENDOSCOPY;  Service: Endoscopy;  Laterality: N/A;   ENTEROSCOPY N/A 10/28/2018   Procedure: ENTEROSCOPY;  Surgeon: Tressia Danas, MD;  Location: [facility] ENDOSCOPY;  Service: Gastroenterology;  Laterality: N/A;   ENTEROSCOPY N/A 10/09/2020   Procedure: ENTEROSCOPY;  Surgeon: Hilarie Fredrickson, MD;  Location[facility] ENDOSCOPY;  Service: Endoscopy;  Laterality: N/A;   ESOPHAGOGASTRODUODENOSCOPY N/A 11/12/2020   Procedure: ESOPHAGOGASTRODUODENOSCOPY (EGD);  Surgeon: Lemar Lofty., MD;  Location:  [facility] ENDOSCOPY;  Service: Gastroenterology;  Laterality: N/A;   ESOPHAGOGASTRODUODENOSCOPY (EGD) WITH PROPOFOL N/A 09/05/2018   Procedure: ESOPHAGOGASTRODUODENOSCOPY (EGD) WITH PROPOFOL;  Surgeon: Benancio Deeds, MD;  Location: [facility] ENDOSCOPY;  Service: Gastroenterology;  Laterality: N/A;   ESOPHAGOGASTRODUODENOSCOPY (EGD) WITH PROPOFOL N/A 11/19/2020   Procedure: ESOPHAGOGASTRODUODENOSCOPY (EGD) WITH PROPOFOL;  Surgeon: Beverley Fiedler, MD;  Location: [facility] ENDOSCOPY;  Service: Gastroenterology;  Laterality: N/A;   EXCISION MASS HEAD     GIVENS CAPSULE STUDY N/A 09/06/2018   Procedure: GIVENS CAPSULE STUDY;  Surgeon: Tressia Danas, MD;  Location: [facility] ENDOSCOPY;  Service: Gastroenterology;  Laterality: N/A;   GIVENS CAPSULE STUDY N/A 09/26/2018   Procedure: GIVENS CAPSULE STUDY;  Surgeon: Beverley Fiedler, MD;  Location: [facility] ENDOSCOPY;  Service: Gastroenterology;  Laterality: N/A;   GIVENS CAPSULE STUDY N/A 06/14/2019   Procedure: GIVENS CAPSULE STUDY;  Surgeon: Napoleon Form, MD;  Location: [facility] ENDOSCOPY;  Service: Endoscopy;  Laterality: N/A;   HEMOSTASIS CLIP PLACEMENT  11/12/2020   Procedure: HEMOSTASIS CLIP PLACEMENT;  Surgeon: Lemar Lofty., MD;  Location: [facility] ENDOSCOPY;  Service: Gastroenterology;;   HEMOSTASIS CONTROL  11/12/2020   Procedure: HEMOSTASIS CONTROL;  Surgeon: Lemar Lofty., MD;  Location: [facility] ENDOSCOPY;  Service: Gastroenterology;;   HOT HEMOSTASIS N/A 10/28/2018   Procedure: HOT HEMOSTASIS (ARGON PLASMA COAGULATION/BICAP);  Surgeon: Tressia Danas, MD;  Location: [facility] ENDOSCOPY;  Service: Gastroenterology;  Laterality: N/A;   HOT HEMOSTASIS N/A 11/12/2020   Procedure: HOT HEMOSTASIS (ARGON PLASMA COAGULATION/BICAP);  Surgeon: Lemar Lofty., MD;  Location: [facility] ENDOSCOPY;  Service: Gastroenterology;  Laterality: N/A;   INCISION AND DRAINAGE PERIRECTAL ABSCESS N/A 06/05/2017   Procedure: IRRIGATION AND DEBRIDEMENT PERIRECTAL ABSCESS;  Surgeon: Andria Meuse, MD;  Location: [facility] OR;  Service: General;  Laterality: N/A;   SUBMUCOSAL TATTOO INJECTION  11/12/2020   Procedure: SUBMUCOSAL TATTOO INJECTION;  Surgeon: Lemar Lofty., MD;  Location: [facility] ENDOSCOPY;  Service: Gastroenterology;;   VIDEO BRONCHOSCOPY Bilateral 05/08/2016   Procedure: VIDEO BRONCHOSCOPY WITH FLUORO;  Surgeon: Oretha Milch, MD;  Location: [facility] ENDOSCOPY;  Service: Cardiopulmonary;  Laterality: Bilateral;   Social History   Occupational History   Occupation: retired  Tobacco  Use   Smoking status: Some Days    Packs/day: 2.00    Years: 47.00    Pack years: 94.00    Types: Cigarettes   Smokeless tobacco: Never  Vaping Use   Vaping Use: Never used  Substance and Sexual Activity   Alcohol use: Not Currently    Alcohol/week: 0.0 standard drinks    Comment: last drink was before xmas   Drug use: No    Types: Cocaine, Marijuana    Comment: "nothing in 20 years"   Sexual activity: Not Currently

## 2021-03-25 DIAGNOSIS — I5043 Acute on chronic combined systolic (congestive) and diastolic (congestive) heart failure: Secondary | ICD-10-CM | POA: Diagnosis not present

## 2021-03-29 ENCOUNTER — Other Ambulatory Visit: Payer: Self-pay | Admitting: Family Medicine

## 2021-03-29 ENCOUNTER — Other Ambulatory Visit: Payer: Self-pay

## 2021-03-29 DIAGNOSIS — I110 Hypertensive heart disease with heart failure: Secondary | ICD-10-CM

## 2021-03-29 MED ORDER — NITROGLYCERIN 0.4 MG SL SUBL
SUBLINGUAL_TABLET | SUBLINGUAL | 1 refills | Status: DC
  Filled 2021-03-29: qty 25, 25d supply, fill #0

## 2021-03-29 MED FILL — Albuterol Sulfate Inhal Aero 108 MCG/ACT (90MCG Base Equiv): RESPIRATORY_TRACT | 25 days supply | Qty: 18 | Fill #1 | Status: CN

## 2021-03-29 MED FILL — Budesonide-Glycopyrrolate-Formoterol Aers 160-9-4.8 MCG/ACT: RESPIRATORY_TRACT | 30 days supply | Qty: 10.7 | Fill #6 | Status: CN

## 2021-03-29 NOTE — Telephone Encounter (Signed)
Requested Prescriptions  Pending Prescriptions Disp Refills  . nitroGLYCERIN (NITROSTAT) 0.4 MG SL tablet 30 tablet 1    Sig: PLACE 1 TABLET (0.4 MG TOTAL) UNDER THE TONGUE EVERY 5 (FIVE) MINUTES AS NEEDED FOR CHEST PAIN. CALL 911 IF SYMPTOMS DO NOT RESOLVE AFTER 3 MINUTES     Cardiovascular:  Nitrates Passed - 03/29/2021 10:17 AM      Passed - Last BP in normal range    BP Readings from Last 1 Encounters:  02/13/21 113/84         Passed - Last Heart Rate in normal range    Pulse Readings from Last 1 Encounters:  02/13/21 83         Passed - Valid encounter within last 12 months    Recent Outpatient Visits          1 month ago COPD GOLD III/still smoking    [facility] [place], Odette Horns, MD   2 months ago Epistaxis   [facility] [place], Odette Horns, MD   3 months ago Acute gastric ulcer with hemorrhage   [facility] [name], MD   9 months ago Impacted cerumen of right ear   [facility] [name], MD   1 year ago Need for vaccination for zoster   [facility] [place], [facility]as, RPH-CPP      Future Appointments            In 5 days [name], Charlaine Dalton, MD [facility]   In 2 weeks Kathryne Hitch, MD [facility]

## 2021-03-30 ENCOUNTER — Other Ambulatory Visit: Payer: Self-pay

## 2021-03-30 MED FILL — Budesonide-Glycopyrrolate-Formoterol Aers 160-9-4.8 MCG/ACT: RESPIRATORY_TRACT | 30 days supply | Qty: 10.7 | Fill #6 | Status: AC

## 2021-03-30 MED FILL — Albuterol Sulfate Inhal Aero 108 MCG/ACT (90MCG Base Equiv): RESPIRATORY_TRACT | 25 days supply | Qty: 18 | Fill #1 | Status: AC

## 2021-03-31 ENCOUNTER — Other Ambulatory Visit: Payer: Self-pay

## 2021-04-03 ENCOUNTER — Other Ambulatory Visit: Payer: Self-pay

## 2021-04-03 ENCOUNTER — Ambulatory Visit (INDEPENDENT_AMBULATORY_CARE_PROVIDER_SITE_OTHER): Payer: Medicare HMO | Admitting: Internal Medicine

## 2021-04-03 ENCOUNTER — Encounter: Payer: Self-pay | Admitting: Internal Medicine

## 2021-04-03 DIAGNOSIS — F17210 Nicotine dependence, cigarettes, uncomplicated: Secondary | ICD-10-CM

## 2021-04-03 DIAGNOSIS — J9611 Chronic respiratory failure with hypoxia: Secondary | ICD-10-CM | POA: Diagnosis not present

## 2021-04-03 DIAGNOSIS — J449 Chronic obstructive pulmonary disease, unspecified: Secondary | ICD-10-CM | POA: Diagnosis not present

## 2021-04-03 DIAGNOSIS — J9610 Chronic respiratory failure, unspecified whether with hypoxia or hypercapnia: Secondary | ICD-10-CM | POA: Diagnosis not present

## 2021-04-03 DIAGNOSIS — J9612 Chronic respiratory failure with hypercapnia: Secondary | ICD-10-CM

## 2021-04-03 DIAGNOSIS — E440 Moderate protein-calorie malnutrition: Secondary | ICD-10-CM | POA: Diagnosis not present

## 2021-04-03 DIAGNOSIS — J9600 Acute respiratory failure, unspecified whether with hypoxia or hypercapnia: Secondary | ICD-10-CM | POA: Diagnosis not present

## 2021-04-03 MED ORDER — BREZTRI AEROSPHERE 160-9-4.8 MCG/ACT IN AERO: INHALATION_SPRAY | RESPIRATORY_TRACT | Status: DC

## 2021-04-03 NOTE — Progress Notes (Signed)
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 male with medical history significant for CABG, hypertension, prior history of stroke without residual, systolic heart failure, current tobacco abuse, chronic headaches, presenting to the emergency department with generalized weakness, and increased shortness of breath, along with  white sputum production. He also reports dyspnea on exertion. In addition, he reports epigastric abdominal pain, which may radiate to the lower portion of his sternum versus chest pain . He denies any lower extremity edema.  Admits to salt rich food ingestion .The symptoms are similar to those of one week ago, at which time, workup was suspicious for acute exacerbation of systolic heart failure. She was started on Cozaar, metoprolol and Lasix 20 mg daily at the time, and was recommended to see cardiology, which he failed to do so. His cardiologist is Dr. Dietrich Pates, last seen in December 2016.the patient continues to smoke 1 pack a day of cigarettes   Discharge Diagnoses:  Acute respiratory failure secondary to CHF exacerbation -O2 sats upon admission 89% -CXR unremarkable for infection -Responded well to IV lasix-->po lasix -Weaned off of O2-SpO2 95% on room air   Acute combined systolic/diastolic heart failure -BNP 2056.8 -Echocardiogram 09/20/2014 showed EF 40-45% -Transition to by mouth Lasix 20 mg daily -Echocardiogram 01/10/2016: EF 35-40%, diastolic dysfunction --AST 147, ALT 110 upon admission -Currently trending downward -hepatitis panel unremarkable -Patient denies drug/alcohol  use -switch metoprolol-->coreg -continue losartan   Essential Hypertension -Has not been taking home medications for over a year due to lack of insurance and monetary funds -Restarted on losartan,  Lasix --switch metoprolol-->coreg 6.25 mg bid   CAD -Currently no chest pain -Continue coreg, losartan, ASA 81 mg daily -LDL 80   Tobacco Abuse -Smoking cessation discussed -Continue nicotine patch   Moderate malnutrition -Nutrition consult   History of Present Illness  02/29/2016 1st [facility] office visit/ Jared [name]  symb 160 2bid/ bid saba (not prn)  Chief Complaint  Patient presents with   Pulmonary Consult    Pt referred by Tereso Newcomer, PA, for cough, hemoptysis, and emphysema. Pt c/o hemoptysis x 3 weeks, pt states this occurs daily but has been improving. Pt c/o increase in SOB with activity and rest.   acute  onset epistaxis/ hemoptyis end of Auguest > ER eval 02/20/16 dx RLL pna by CTa chest > rx levaquin/ prednisone and minimal hemoptysis now  Doe = MMRC2 = can't walk a nl pace on a flat grade s sob but does fine slow and flat eg [employer] shopping rec Plan A = Automatic =  Symbicort 160 Take 2 puffs first thing in am and then another 2 puffs about 12 hours later.  Work on inhaler technique: Plan B = Backup Only use your albuterol as a rescue medication\ Plan C = Crisis - only use your albuterol nebulizer if you first try Plan B and it fails to help > ok to use the nebulizer up to every 4 hours but if start needing it regularly call for immediate appointment  The key is to stop smoking completely  before smoking completely stops you!  Please schedule a follow up office visit in 2 weeks, sooner if needed with cxr on return > did not return   Admit date: 05/04/2016 Discharge date: 05/09/2016     DISCHARGE DIAGNOSES:  Principal Problem:   Hemoptysis Active Problems:   Community acquired pneumonia of right lower lobe of lung ([facility]) - - FOB RLL tbbx ATYPICAL PNEUMOCYTES  PROLIFERATION/ LLL BAL 05/08/16    Hypertension   COPD with acute exacerbation (HCC)   Nonischemic dilated cardiomyopathy (HCC)   Cigarette smoker   Chronic systolic CHF (congestive heart failure) ([place])   06/03/16 to ER> only better while on neb    06/26/2016  f/u ov/Jared [name] re:  GOLD III copd / maint symb 160 / saba  Chief Complaint  Patient presents with   Follow-up    pt had CT today. pt states breathing has worsen since last OV. pt c/o increased sob with exertion, prod cough with white mucus, wheezing & chest tightness,  breathing was worse w/in 2 days of last ov / then ran out of lasix one week ago and swelling started to get worse Also worse orthopnea ? Related to leg swelling in terms of timing but pt not sure  Doe now = MMRC3 = can't walk 100 yards even at a slow pace at a flat grade s stopping due to sob  rec Bisoprolol 5 mg one daily in place of the twice daily corevidol and resume your lasix and potassium  Plan A = Automatic = stop symbicort and start bevespi Take 2 puffs first thing in am and then another 2 puffs about 12 hours later.  Plan B = Backup Only use your albuterol as a rescue medication The key is to stop smoking completely before smoking completely stops you!  Please schedule a follow up office visit in 4 weeks, sooner if needed bring all meds      12/31/2018  f/u ov/Jared [name] re: re establish re copd now [place] ? Some bladder problems on LAMA?  Chief Complaint  Patient presents with   Follow-up    Breathing seems slightly worse since the last visit. He is using his ventolin inhaler once per day and nebs about 2 x per day.   Dyspnea:  Worse for at least a year no better with 02/ some discomfort off 02 " like tightness "  Currently MMRC3 = can't walk 100 yards even at a slow pace at a flat grade s stopping due to sob even on 02 up to 4lpm  Cough: none  Sleeping: flat ok / no am cough /  SABA use: as above  Better p nebs/ over using  02: 3lpm 24/7  rec We need you  need you to monitor your 02 level to be sure it's above 90% Be sure protonix 40 mg Take 30-60 min before first meal of the day  We will ask [facility] to do BEST FIT analysis for longterm 02 The key is to stop smoking completely before smoking completely stops you! Add tudorza one puff twice daily  Only use your albuterol as a rescue medication  Only use nebulizer if you try the inhaler first  = change duoneb to pure albuterol 2.5 mg up to  every 4 hours is the max  Prednisone 10 mg take  4 each am x 2 days,   2 each am x 2 days,  1 each am x 2 days and stop  Please schedule a follow up office visit in  4 weeks, sooner if needed  with all medications /inhalers/ solutions in hand so we can verify exactly what you are taking. This includes all medications from all doctors and over the counters Add: f/u sinus dz by CT         06/30/2019  f/u ov/Jared [name] re: GOLD III/ 02 dep but till smoking maint on tudorza one bid Chief Complaint  Patient presents with   Follow-up    Breathing has improved some and he is using his albuterol inhaler 2 x per wk and neb about 3 x per wk.   Dyspnea: doe x room to room  Cough: none  Sleeping: lies flat / one pillow  SABA use: as above  02: 2lpm up to 3lpm  rec Plan A = Automatic = Always=    Anoro one click first thing in am, take two good drags  Plan B = Backup (to supplement plan A, not to replace it) Only use your albuterol inhaler as a rescue medication  Plan C = Crisis (instead of Plan B but only if Plan B stops working) - only use your albuterol nebulizer if you first try Plan B and it fails to help > ok to use the nebulizer up to every 4 hours but if start needing it regularly call for immediate appointment The key is to stop smoking completely before smoking completely stops you!   04/04/2020  f/u ov/Jared [name] re:  GOLD III copd / 02 dep with ex and sleep  Chief Complaint  Patient presents with   Follow-up    No compaints or concerns with breathing    Dyspnea:  Maybe two aisles at grocery store  Cough: mostly in am's > gray just in am  Sleeping: bed is flat / one pillow  SABA use: maybe once a day/ very rarely neb  02:  3lpm but thinks it comes off/ 3lpm pulsing am   - not using at rest = 95% rec Plan A = Automatic = Always=    Breztri Take 2 puffs first thing in am and then another 2 puffs about 12 hours later.  Work on inhaler technique: Plan B = Backup (to supplement plan A, not to replace it) Only use your albuterol inhaler as a rescue medication Plan C = Crisis (instead of Plan B but only if Plan B stops working) - only use your albuterol nebulizer if you first try Plan B  Rec: 3lpm 24/7 but increase to 4lpm pulsed when walking outside of house Make sure you check your oxygen saturations at highest level of activity    10/04/2020  f/u ov/Jared [name] re: GOLD III/ 02 dep with activity only  Chief Complaint  Patient presents with   Follow-up    Increased sob x 1 wk., wheezing, cough-white, brown   Dyspnea: assoc with abd bloating with ct 08/18/20 of abd showed emphysematous changes on lung custs Cough: slt increase x one week with slt discolored mucus and subj wheeze? Pollen related every yer  Sleeping: flat bed one pillow SABA use: sev times a day, poor insight > not following ABC plan   02: 3lpm with activity  Covid status:   vax x 3/ pfizer   Rec Plan A = Automatic = Always=    Breztri Take 2 puffs first thing in am and then another 2 puffs about 12 hours later.  Work on inhaler technique:   Plan B = Backup (to supplement plan A, not to replace it) Only use your albuterol inhaler as a  rescue medication )  Plan C = Crisis (instead of Plan B but only if Plan B stops working) - only use your albuterol nebulizer if you first try Plan B and it fails to help   Make sure you check your oxygen saturations at highest level of activity to be sure it stays over 90%  The key is to stop smoking completely before smoking completely stops  you! zpak Prednisone 10 mg take  4 each am x 2 days,   2 each am x 2 days,  1 each am x 2 days and stop     04/03/2021  f/u ov/Jared [name] re: gold 3 copd/ 02 dep 24/7 - breztri 2bid  Chief Complaint  Patient presents with   Follow-up    Patient states SOB has worsened  Dyspnea:  50 ft on 4lpm  Cough: none  Sleeping: bed is flat one pillow/ on cpap now per hospital d/c but doesn't like it  SABA use: 3 x daily hfa/ neb sev times per week 02:  3lpm at rest,  4 lpm  activity / 3lpm Bipap  Covid status:   vax x 4    No obvious day to day or daytime variability or assoc excess/ purulent sputum or mucus plugs or hemoptysis or cp or chest tightness, subjective wheeze or overt sinus or hb symptoms.   Sleeping  without nocturnal  or early am exacerbation  of respiratory  c/o's or need for noct saba. Also denies any obvious fluctuation of symptoms with weather or environmental changes or other aggravating or alleviating factors except as outlined above   No unusual exposure hx or h/o childhood pna/ asthma or knowledge of premature birth.  Current Allergies, Complete Past Medical History, Past Surgical History, Family History, and Social History were reviewed in [employer] record.  ROS  The following are not active complaints unless bolded Hoarseness, sore throat, dysphagia, dental problems, itching, sneezing,  nasal congestion or discharge of excess mucus or purulent secretions, ear ache,   fever, chills, sweats, unintended wt loss or wt gain, classically pleuritic or exertional cp,  orthopnea pnd or arm/hand swelling  or leg swelling, presyncope, palpitations, abdominal pain, anorexia, nausea, vomiting, diarrhea  or change in bowel habits or change in bladder habits, change in stools or change in urine, dysuria, hematuria,  rash, arthralgias, visual complaints, headache, numbness, weakness or ataxia or problems with walking or coordination,  change in mood or  memory.         Current Meds  Medication Sig   albuterol (PROVENTIL) (2.5 MG/3ML) 0.083% nebulizer solution Take 3 mLs (2.5 mg total) by nebulization every 4 (four) hours as needed for wheezing or shortness of breath.   albuterol (VENTOLIN HFA) 108 (90 Base) MCG/ACT inhaler INHALE 2 PUFFS INTO THE LUNGS EVERY 6 (SIX) HOURS AS NEEDED FOR WHEEZING OR SHORTNESS OF BREATH.   atorvastatin (LIPITOR) 80 MG tablet TAKE 1 TABLET EVERY DAY (Patient taking differently: Take 80 mg by mouth daily.)   Budeson-Glycopyrrol-Formoterol 160-9-4.8 MCG/ACT AERO INHALE 2 PUFFS INTO THE LUNGS 2 (TWO) TIMES DAILY.   ferrous sulfate 325 (65 FE) MG tablet Take 1 tablet (325 mg total) by mouth 2 (two) times daily with a meal.   fluticasone (FLONASE) 50 MCG/ACT nasal spray Place 1 spray into both nostrils daily.   furosemide (LASIX) 40 MG tablet TAKE 1 TABLET EVERY DAY (Patient taking differently: Take 40 mg by mouth daily.)   gabapentin (NEURONTIN) 300 MG capsule TAKE 1 CAPSULE (300 MG  TOTAL) BY MOUTH AT BEDTIME. (Patient taking differently: Take 300 mg by mouth at bedtime.)   HYDROcodone-acetaminophen (NORCO/VICODIN) 5-325 MG tablet Take 1-2 tablets by mouth every 6 (six) hours as needed for moderate pain.   Misc. Devices MISC Inhale 3 L into the lungs as needed. Portable oxygen concentrator. Diagnosis COPD.   Multiple Vitamin (MULTIVITAMIN WITH MINERALS) TABS tablet Take 1 tablet by mouth daily.   nitroGLYCERIN (NITROSTAT) 0.4 MG SL tablet Place 1 tablet (0.4 mg total) under the tongue every 5 (five) minutes as needed for chest pain.   nitroGLYCERIN (NITROSTAT) 0.4 MG SL tablet PLACE 1 TABLET (0.4 MG TOTAL) UNDER THE TONGUE EVERY 5 (FIVE) MINUTES AS NEEDED FOR CHEST PAIN. CALL 911 IF SYMPTOMS DO NOT RESOLVE AFTER 3 MINUTES   pantoprazole (PROTONIX) 40 MG tablet Take 1 tablet (40 mg total) by mouth daily.   rivaroxaban (XARELTO) 20 MG TABS tablet Take 1 tablet (20 mg total) by mouth daily with supper.   traMADol (ULTRAM) 50 MG tablet  Take 1 tablet (50 mg total) by mouth every 6 (six) hours as needed for moderate pain.              Objective:   Physical Exam    04/03/2021    224  10/04/2020      217 04/04/2020    212 06/30/2019      189  01/28/2019      191 12/31/2018      189  10/22/2016      190  07/24/2016        209  06/26/2016        213  05/28/2016      194   02/29/16 177 lb 12.8 oz (80.6 kg)  02/24/16 182 lb 12.8 oz (82.9 kg)  02/20/16 175 lb (79.4 kg)    Vital signs reviewed  04/03/2021  - Note at rest 02 sats  99% on3lpm   General appearance:    elderly bm nad at rest, freq belching       HEENT : pt wearing mask not removed for exam due to covid -19 concerns.    NECK :  without JVD/Nodes/TM/ nl carotid upstrokes bilaterally   LUNGS: no acc muscle use,  Mod barrel  contour chest wall with bilateral  Distant bs s audible wheeze and  without cough on insp or exp maneuvers and mod  Hyperresonant  to  percussion bilaterally     CV:  RRR  no s3 or murmur or increase in P2, and no edema   ABD: marked distentiton/ typmpanitic/  nontender with pos mid insp Hoover's  in the supine position. No bruits or organomegaly appreciated, bowel sounds nl  MS:     ext warm without deformities, calf tenderness, cyanosis or clubbing No obvious joint restrictions   SKIN: warm and dry without lesions    NEURO:  alert, approp, nl sensorium with  no motor or cerebellar deficits apparent.           I personally reviewed images and agree with radiology impression as follows:   Chest CT a  02/13/21  1. Negative for acute pulmonary embolus or aortic dissection. 2. Emphysema without acute airspace disease 3. Slightly enlarged pulmonary trunk and pulmonary arteries suggestive of arterial hypertension.     Assessment & Plan:

## 2021-04-03 NOTE — Patient Instructions (Addendum)
Plan A = Automatic = Always= Breztri Take 2 puffs first thing in am and then another 2 puffs about 12 hours later.    Plan B = Backup (to supplement plan A, not to replace it) Only use your albuterol inhaler as a rescue medication to be used if you can't catch your breath by resting or doing a relaxed purse lip breathing pattern.  - The less you use it, the better it will work when you need it. - Ok to use the inhaler up to 2 puffs  every 4 hours if you must but call for appointment if use goes up over your usual need - Don't leave home without it !!  (think of it like the spare tire for your car)   Plan C = Crisis (instead of Plan B but only if Plan B stops working) - only use your albuterol nebulizer if you first try Plan B and it fails to help > ok to use the nebulizer up to every 4 hours but if start needing it regularly call for immediate appointment  Mylanta 2 or maalox plus  - both have simethicone and will help the abdominal swelling as well as trying off the bipap.  Avoid gassy foods especially [place] food, boiled eggs, fresh / undercooked vegetables or salads like spinach   Make sure you check your oxygen saturation  at your highest level of activity  to be sure it stays over 90% and adjust  02 flow upward to maintain this level if needed but remember to turn it back to previous settings when you stop (to conserve your supply).   We will walk you today at your normal pace to see how much 02 you need  Try off the bipap and let me know if any worse and I will refer you  to our sleep doctors    Please schedule a follow up office visit in 6 weeks, call sooner if needed

## 2021-04-04 ENCOUNTER — Encounter: Payer: Self-pay | Admitting: Internal Medicine

## 2021-04-04 NOTE — Assessment & Plan Note (Signed)
Active smoker 02/29/2016  After extensive coaching HFA effectiveness =    75% > continue symbicort  - Spirometry 05/28/2016  FEV1 1.48 (40%)  Ratio 46  p am symbicort 160 2bid  - 06/26/2016  After extensive coaching HFA effectiveness =    90% > bevespi 2 bid and change coreg to bisoprolol 5 mg daily > not done as of 07/24/2016 > at ov 10/22/2016 off coreg and all inhalers and much better sob but still some cough on entresto maint - Spirometry 10/22/2016  FEV1 1.24 (35%)  Ratio 48 - 12/31/2018    tudorza rx one puff bid  - 01/28/2019  After extensive coaching inhaler device,  effectiveness =    90% with tudorza  03/09/2019 Reports improvement with [name] but not covered, refer to [facility]. May need PA processed- he has been on bevespi and spiriva in the past  - 06/30/2019  After extensive coaching inhaler device,  effectiveness =    90% with elipta try anoro one each am  PFT's  04/04/2020  FEV1 1.25 (35 % ) ratio 0.41  p 6 % improvement from saba p tudorza prior to study with DLCO  8.26 (27%) corrects to 1.38 (34%)  for alv volume and FV curve classic concave pattern  - 04/04/2020   breztri 2bid  - 04/03/2021  After extensive coaching inhaler device,  effectiveness =    90%    Group D in terms of symptom/risk and laba/lama/ICS  therefore appropriate rx at this point >>>  Continue breztri and approp saba   Re SABA :  I spent extra time with pt today reviewing appropriate use of albuterol for prn use on exertion with the following points: 1) saba is for relief of sob that does not improve by walking a slower pace or resting but rather if the pt does not improve after trying this first. 2) If the pt is convinced, as many are, that saba helps recover from activity faster then it's easy to tell if this is the case by re-challenging : ie stop, take the inhaler, then p 5 minutes try the exact same activity (intensity of workload) that just caused the symptoms and see if they are substantially diminished or not after  saba 3) if there is an activity that reproducibly causes the symptoms, try the saba 15 min before the activity on alternate days   If in fact the saba really does help, then fine to continue to use it prn but advised may need to look closer at the maintenance regimen being used to achieve better control of airways disease with exertion.

## 2021-04-04 NOTE — Assessment & Plan Note (Signed)
See copd - 10/22/2016  Walked RA x 3 laps @ 185 ft each stopped due to  End of study moderate pace, no desat  - HCO03  11/11/18  = 41  - 12/31/2018   Walked 2lpm   2 laps @  approx 254ft each @ avg pace  stopped due to  Sob on 2lpm but no desats   - 01/28/2019   Walked RA x one lap =  approx 250 ft - stopped due to  Sob with sats ok on 3lpm POC > referred for POC  - HCO3  06/15/19 = 38  -  04/04/2020   Walked 3lpm  pulsed  3laps @ approx 270ft each @avg  pace  stopped due desats p second lap to 87% corrected on 4lpm pulsed   With sats 98% at end  - 04/03/2021   Walked on RA  desat to 84% p 250 ft   Then 250 ft on 2lpm cont avg pace sats 92% at lowest   rec : no need for 02 at rest or room to room, titrate when out to maint > 90% - 02 2lpm hs but no cpap/bipap as may be contributing to abd gas issues

## 2021-04-04 NOTE — Assessment & Plan Note (Signed)
Counseled re importance of smoking cessation but did not meet time criteria for separate billing    Each maintenance medication was reviewed in detail including emphasizing most importantly the difference between maintenance and prns and under what circumstances the prns are to be triggered using an action plan format where appropriate.  Total time for H and P, chart review, counseling, reviewing hfa/neb/02 device(s) , directly observing portions of ambulatory 02 saturation study/ and generating customized AVS unique to this office visit / same day charting = 33 min

## 2021-04-05 ENCOUNTER — Ambulatory Visit: Payer: Medicare HMO | Admitting: Internal Medicine

## 2021-04-11 ENCOUNTER — Other Ambulatory Visit: Payer: Self-pay

## 2021-04-12 ENCOUNTER — Encounter: Payer: Self-pay | Admitting: Orthopaedic Surgery

## 2021-04-12 ENCOUNTER — Other Ambulatory Visit: Payer: Self-pay

## 2021-04-12 ENCOUNTER — Telehealth: Payer: Self-pay | Admitting: Internal Medicine

## 2021-04-12 ENCOUNTER — Ambulatory Visit (INDEPENDENT_AMBULATORY_CARE_PROVIDER_SITE_OTHER): Payer: Medicare HMO

## 2021-04-12 ENCOUNTER — Ambulatory Visit (INDEPENDENT_AMBULATORY_CARE_PROVIDER_SITE_OTHER): Payer: Medicare HMO | Admitting: Orthopaedic Surgery

## 2021-04-12 DIAGNOSIS — J9611 Chronic respiratory failure with hypoxia: Secondary | ICD-10-CM

## 2021-04-12 DIAGNOSIS — M25571 Pain in right ankle and joints of right foot: Secondary | ICD-10-CM

## 2021-04-12 DIAGNOSIS — J449 Chronic obstructive pulmonary disease, unspecified: Secondary | ICD-10-CM

## 2021-04-12 DIAGNOSIS — J9612 Chronic respiratory failure with hypercapnia: Secondary | ICD-10-CM

## 2021-04-12 NOTE — Telephone Encounter (Signed)
Called and spoke with pt about the fax received and he said that he had not been using it. Stated to him that we would place an order for the vent to be discontinued so [facility] could come and pick it up. Pt verbalized understanding. Order placed. Nothing further needed.

## 2021-04-12 NOTE — Telephone Encounter (Signed)
Ok to d/c.

## 2021-04-12 NOTE — Progress Notes (Signed)
The patient is now about 2 months into an injury to his right ankle.  He has a lateral malleolus fracture that is nondisplaced.  He is now walking in regular shoes.  He is concerned about circulation in his legs in general and the skin discoloration on the right injured foot and ankle.  He does have known arthritis in his right knee and I believe has had pseudogout before.  A month ago we aspirated fluid off the knee and place a steroid injection in the knee.  His x-rays do show osteoarthritis of the knee.  He has seen Dr. [name] recently for his chronic COPD.  He does have a history of heart issues and blood pressure issues as well.  He is still a smoker.  The [facility] assessed today.  Actually the swelling is significantly less and the skin discoloration he is noting is more related to trauma.  His foot seems to be well-perfused and I gave him reassurance that this appears normal from having an injury like this.  He does have good range of motion of the right ankle and is ligamentously stable.  There is still some pain over the lateral malleolus.  An x-ray of the lateral malleolus and ankle on the right side shows the fracture is healing but not healed completely yet.  The ankle mortise is congruent.  His right knee shows just a minimal effusion today with painful arc of motion.  He will continue to slowly increase his activities as comfort allows.  We will see him back for final visit in 4 weeks with a repeat 3 views of the right ankle.  At that visit I would likely consider another steroid injection in his right knee.

## 2021-04-12 NOTE — Telephone Encounter (Signed)
Received a fax from [facility] at [facility]. Per the fax, the patient is requesting [facility] to pickup his vent but they need an order from Dr. [name].   Reviewed patient's last OV note and found this in his AVS:   Try off the bipap and let me know if any worse and I will refer you  to our sleep doctors.    MW, can you please advise? Thanks!

## 2021-05-01 ENCOUNTER — Other Ambulatory Visit: Payer: Self-pay

## 2021-05-01 ENCOUNTER — Telehealth: Payer: Self-pay | Admitting: Internal Medicine

## 2021-05-01 DIAGNOSIS — Z961 Presence of intraocular lens: Secondary | ICD-10-CM | POA: Diagnosis not present

## 2021-05-01 DIAGNOSIS — H43813 Vitreous degeneration, bilateral: Secondary | ICD-10-CM | POA: Diagnosis not present

## 2021-05-01 MED ORDER — BREZTRI AEROSPHERE 160-9-4.8 MCG/ACT IN AERO
INHALATION_SPRAY | RESPIRATORY_TRACT | 6 refills | Status: DC
  Filled 2021-05-01: qty 10.7, 30d supply, fill #0
  Filled 2021-05-29: qty 10.7, 30d supply, fill #1
  Filled 2021-06-28: qty 10.7, 30d supply, fill #0
  Filled 2021-08-01: qty 10.7, 30d supply, fill #1
  Filled 2021-08-28: qty 10.7, 30d supply, fill #2
  Filled 2021-09-29: qty 10.7, 30d supply, fill #3
  Filled 2021-10-31: qty 10.7, 30d supply, fill #4

## 2021-05-01 NOTE — Telephone Encounter (Signed)
I called and spoke with pt and he stated that he went to the pharmacy to pick up the breztri and the pharmacy told him that we cancelled this refill.    I reviewed the last OV with MW and it stated for the pt to stay on the [facility].  I have sent this back to the pharmacy for refills. Pt is aware.  Nothing further is needed.

## 2021-05-02 ENCOUNTER — Other Ambulatory Visit: Payer: Self-pay

## 2021-05-10 ENCOUNTER — Telehealth: Payer: Self-pay

## 2021-05-10 ENCOUNTER — Ambulatory Visit: Payer: Self-pay

## 2021-05-10 ENCOUNTER — Encounter: Payer: Self-pay | Admitting: Orthopaedic Surgery

## 2021-05-10 ENCOUNTER — Ambulatory Visit (INDEPENDENT_AMBULATORY_CARE_PROVIDER_SITE_OTHER): Payer: Medicare HMO | Admitting: Orthopaedic Surgery

## 2021-05-10 ENCOUNTER — Ambulatory Visit (INDEPENDENT_AMBULATORY_CARE_PROVIDER_SITE_OTHER): Payer: Medicare HMO

## 2021-05-10 ENCOUNTER — Other Ambulatory Visit: Payer: Self-pay

## 2021-05-10 DIAGNOSIS — S82891D Other fracture of right lower leg, subsequent encounter for closed fracture with routine healing: Secondary | ICD-10-CM

## 2021-05-10 DIAGNOSIS — M1711 Unilateral primary osteoarthritis, right knee: Secondary | ICD-10-CM

## 2021-05-10 NOTE — Telephone Encounter (Signed)
Noted  

## 2021-05-10 NOTE — Progress Notes (Signed)
HPI: Mr. Hackler returns today follow-up of his right ankle lateral malleolus fracture nondisplaced which occurred on 02/07/2021.  He states the ankle overall is doing well his main complaint is his right knee which she has known osteoarthritis.  He had a cortisone injection in the knee on 03/15/2021 and aspiration he states this gave him some relief but not complete relief for about a week.  He has had no new falls or injuries.  He has no complaints in regards to the right ankle today.  Review of systems: See HPI.  Physical exam: General well-developed well-nourished male no acute distress.  Ambulates with an antalgic gait on the right without any assistive device.  Right ankle he has full dorsiflexion plantarflexion without pain.  No tenderness over the medial or lateral malleolus.  Has full inversion eversion foot without pain.  Right calf supple nontender.  Right knee: Tenderness along medial joint line.  No effusion abnormal warmth or erythema.  Radiographs: Right ankle 3 views: Talus well located within the ankle mortise.  Lateral malleolus fracture remains unchanged in overall position alignment.  Interval healing with significant consolidation noted.  No other acute fractures noted.  Impression: Right lateral malleolus fracture healed Osteoarthritis right knee.   Plan: In regards to the right ankle he is activities as tolerated.  Given the fact that the cortisone injection gave him no real relief in the right knee recommend supplemental injection.  Again he has known osteoarthritis in the knee and is on anticoagulation unable to take NSAIDs.  We will try to gain approval for supplemental injection and have him back once available.  Handout on supplemental injection was given.  Questions were encouraged and answered.

## 2021-05-10 NOTE — Telephone Encounter (Signed)
Please get auth for right knee gel injection -Dr. Blackman pt.  

## 2021-05-15 ENCOUNTER — Ambulatory Visit (INDEPENDENT_AMBULATORY_CARE_PROVIDER_SITE_OTHER): Payer: Medicare HMO | Admitting: Acute Care

## 2021-05-15 ENCOUNTER — Other Ambulatory Visit: Payer: Self-pay

## 2021-05-15 ENCOUNTER — Encounter: Payer: Self-pay | Admitting: Acute Care

## 2021-05-15 VITALS — BP 138/62 | HR 105 | Temp 98.1°F | Ht 75.0 in | Wt 223.2 lb

## 2021-05-15 DIAGNOSIS — J449 Chronic obstructive pulmonary disease, unspecified: Secondary | ICD-10-CM | POA: Diagnosis not present

## 2021-05-15 DIAGNOSIS — Z720 Tobacco use: Secondary | ICD-10-CM

## 2021-05-15 DIAGNOSIS — F17210 Nicotine dependence, cigarettes, uncomplicated: Secondary | ICD-10-CM

## 2021-05-15 NOTE — Progress Notes (Signed)
History of Present Illness Jared [name] is a 65 y.o. male current every day smoker with COPD Gold III. He is followed by Dr. Melvyn Novas since 05/2016.  Smoking history: 2 PPD x 47 years>> 94 Pack year smoking history   05/15/2021 Pt. Presents for follow up. He was seen by Dr. Melvyn Novas 04/03/2021. Per the patient he was taken off his oxygen by Dr. Melvyn Novas at his last OV.He says he does check his oxygen levels daily. He says on occasion with extremem exertion they will drop to 88%, but he says they rebound immediately. He also was told by Dr. Melvyn Novas ot stop using his BiPAP machine. This has subsequently been picked up by the DME. He said he has checked his oxygen levels off his CPAP, and it has been > 90% each time. He does endorse dyspnea when he is active. He has been started on [place], and he feels this has been working well for him. He states he states he rarely wheezes. He is using his Breztri twice daily. He uses his rescue inhaler 2-3 times a day. He has significant cough with secretions in the mornings, but rare the rest of the day. Secretions are clear. We have talked about quitting smoking. We discussed that he does qualify for Lung Cancer Screening. He would like to participate. No fever, chest pain, orthopnea or hemoptysis.    Test Results: 04/03/2021   Walked on RA  desat to 84% p 250 ft   Then 250 ft on 2lpm cont avg pace sats 92% at lowest   Chest CT a  02/13/21  1. Negative for acute pulmonary embolus or aortic dissection. 2. Emphysema without acute airspace disease 3. Slightly enlarged pulmonary trunk and pulmonary arteries suggestive of arterial hypertension.    - Spirometry 05/28/2016  FEV1 1.48 (40%)  Ratio 46  p am symbicort 160 2bid   CBC Latest Ref Rng & Units 02/13/2021 02/09/2021 02/08/2021  WBC 4.0 - 10.5 K/uL 8.9 9.2 7.4  Hemoglobin 13.0 - 17.0 g/dL 12.1(L) 11.4(L) 11.5(L)  Hematocrit 39.0 - 52.0 % 46.7 45.0 46.8  Platelets 150 - 400 K/uL 85(L) 128(L) 152    BMP Latest Ref Rng &  Units 02/13/2021 02/11/2021 02/10/2021  Glucose 70 - 99 mg/dL 102(H) 83 99  BUN 8 - 23 mg/dL 13 12 10   Creatinine 0.61 - 1.24 mg/dL 0.79 0.68 0.77  BUN/Creat Ratio 10 - 24 - - -  Sodium 135 - 145 mmol/L 142 141 138  Potassium 3.5 - 5.1 mmol/L 4.2 4.1 4.2  Chloride 98 - 111 mmol/L 94(L) 88(L) 88(L)  CO2 22 - 32 mmol/L 41(H) 45(H) 45(H)  Calcium 8.9 - 10.3 mg/dL 9.0 9.1 8.9    BNP    Component Value Date/Time   BNP 659.2 (H) 02/07/2021 1807   BNP 1,458.6 (H) 07/30/2016 1431    ProBNP    Component Value Date/Time   PROBNP 2,541 (H) 08/03/2016 1330   PROBNP 330.0 (H) 11/18/2013 0829    PFT    Component Value Date/Time   FEV1PRE 1.17 04/04/2020 0904   FEV1POST 1.25 04/04/2020 0904   FVCPRE 2.77 04/04/2020 0904   FVCPOST 3.02 04/04/2020 0904   TLC 8.51 04/04/2020 0904   DLCOUNC 8.26 04/04/2020 0904   PREFEV1FVCRT 42 04/04/2020 0904   PSTFEV1FVCRT 41 04/04/2020 0904    XR Ankle Complete Right  Result Date: 05/10/2021 Right ankle 3 views: Talus well located within the ankle mortise.  Lateral malleolus fracture remains unchanged in overall position alignment.  Interval  healing with significant consolidation noted.  No other acute fractures noted.    Past medical hx Past Medical History:  Diagnosis Date   AVM (arteriovenous malformation)    CAD (coronary artery disease)    a. LHC 5/12:  LAD 20, pLCx 20, pRCA 40, dRCA 40, EF 35%, diff HK  //  b. Myoview 4/16: Overall Impression:  High risk stress nuclear study There is no evidence of ischemia.  There is severe LV dysfunction. LV Ejection Fraction: 30%.  LV Wall Motion:  There is global LV hypokinesis.     CAP (community acquired pneumonia) 09/2013   Chronic combined systolic and diastolic CHF (congestive heart failure) (HCC)    a. Echo 4/16:Mild LVH, EF 40-45%, diffuse HK //  b. Echo 8/17: EF 35-40%, diffuse HK, diastolic dysfunction, aortic sclerosis, trivial MR, moderate LAE, normal RVSF, moderate RAE, mild TR, PASP 42 mmHg //  c. Echo 4/18: Mild concentric LVH, EF 30-35, normal wall motion, grade 1 diastolic dysfunction, PASP 49   Chronic respiratory failure (HCC)    Cluster headache    "hx; haven't had one in awhile" (01/09/2016)   COPD (chronic obstructive pulmonary disease) (HCC)    [name] 01/09/2016   History of CVA (cerebrovascular accident)    Hypertension    IDA (iron deficiency anemia)    Moderate tobacco use disorder    NICM (nonischemic cardiomyopathy) (HCC)    Nicotine addiction    Tobacco abuse      Social History   Tobacco Use   Smoking status: Every Day    Packs/day: 2.00    Years: 47.00    Pack years: 94.00    Types: Cigarettes   Smokeless tobacco: Never   Tobacco comments:    4/5 cigs  Vaping Use   Vaping Use: Never used  Substance Use Topics   Alcohol use: Not Currently    Alcohol/week: 0.0 standard drinks    Comment: last drink was before xmas   Drug use: No    Types: Cocaine, Marijuana    Comment: "nothing in 20 years"    Mr.Wisor reports that he has been smoking cigarettes. He has a 94.00 pack-year smoking history. He has never used smokeless tobacco. He reports that he does not currently use alcohol. He reports that he does not use drugs.  Tobacco Cessation: Current every day smoker with a 94 pack year smoking history. Counseled on quitting  Past surgical hx, Family hx, Social hx all reviewed.  Current Outpatient Medications on File Prior to Visit  Medication Sig   albuterol (PROVENTIL) (2.5 MG/3ML) 0.083% nebulizer solution Take 3 mLs (2.5 mg total) by nebulization every 4 (four) hours as needed for wheezing or shortness of breath.   albuterol (VENTOLIN HFA) 108 (90 Base) MCG/ACT inhaler INHALE 2 PUFFS INTO THE LUNGS EVERY 6 (SIX) HOURS AS NEEDED FOR WHEEZING OR SHORTNESS OF BREATH.   atorvastatin (LIPITOR) 80 MG tablet TAKE 1 TABLET EVERY DAY (Patient taking differently: Take 80 mg by mouth daily.)   Budeson-Glycopyrrol-Formoterol (BREZTRI AEROSPHERE) 160-9-4.8 MCG/ACT  AERO Take 2 puffs first thing in am and then another 2 puffs about 12 hours later.   ferrous sulfate 325 (65 FE) MG tablet Take 1 tablet (325 mg total) by mouth 2 (two) times daily with a meal.   fluticasone (FLONASE) 50 MCG/ACT nasal spray Place 1 spray into both nostrils daily.   furosemide (LASIX) 40 MG tablet TAKE 1 TABLET EVERY DAY (Patient taking differently: Take 40 mg by mouth daily.)   gabapentin (NEURONTIN)  300 MG capsule TAKE 1 CAPSULE (300 MG TOTAL) BY MOUTH AT BEDTIME. (Patient taking differently: Take 300 mg by mouth at bedtime.)   HYDROcodone-acetaminophen (NORCO/VICODIN) 5-325 MG tablet Take 1-2 tablets by mouth every 6 (six) hours as needed for moderate pain.   Misc. Devices MISC Inhale 3 L into the lungs as needed. Portable oxygen concentrator. Diagnosis COPD.   Multiple Vitamin (MULTIVITAMIN WITH MINERALS) TABS tablet Take 1 tablet by mouth daily.   nitroGLYCERIN (NITROSTAT) 0.4 MG SL tablet Place 1 tablet (0.4 mg total) under the tongue every 5 (five) minutes as needed for chest pain.   nitroGLYCERIN (NITROSTAT) 0.4 MG SL tablet PLACE 1 TABLET (0.4 MG TOTAL) UNDER THE TONGUE EVERY 5 (FIVE) MINUTES AS NEEDED FOR CHEST PAIN. CALL 911 IF SYMPTOMS DO NOT RESOLVE AFTER 3 MINUTES   rivaroxaban (XARELTO) 20 MG TABS tablet Take 1 tablet (20 mg total) by mouth daily with supper.   traMADol (ULTRAM) 50 MG tablet Take 1 tablet (50 mg total) by mouth every 6 (six) hours as needed for moderate pain.   [DISCONTINUED] TUDORZA PRESSAIR 400 MCG/ACT AEPB INHALE 1 PUFF INTO THE LUNGS IN THE MORNING AND AT BEDTIME.   No current facility-administered medications on file prior to visit.     Allergies  Allergen Reactions   Lisinopril Cough    Review Of Systems:  Constitutional:   No  weight loss, night sweats,  Fevers, chills, fatigue, or  lassitude.  HEENT:   No headaches,  Difficulty swallowing,  Tooth/dental problems, or  Sore throat,                No sneezing, itching, ear ache, nasal  congestion, post nasal drip,   CV:  No chest pain,  Orthopnea, PND, swelling in lower extremities, anasarca, dizziness, palpitations, syncope.   GI  No heartburn, indigestion, abdominal pain, nausea, vomiting, diarrhea, change in bowel habits, loss of appetite, bloody stools.   Resp: + shortness of breath with exertion or at rest.  No excess mucus, no productive cough,  No non-productive cough,  No coughing up of blood.  No change in color of mucus.  No wheezing.  No chest wall deformity  Skin: no rash or lesions.  GU: no dysuria, change in color of urine, no urgency or frequency.  No flank pain, no hematuria   MS:  No joint pain or swelling.  No decreased range of motion.  No back pain.  Psych:  No change in mood or affect. No depression or anxiety.  No memory loss.   Vital Signs BP 138/62 (BP Location: Left Arm, Patient Position: Sitting, Cuff Size: Normal)   Pulse (!) 105   Temp 98.1 F (36.7 C) (Oral)   Ht 6\' 3"  (1.905 m)   Wt 223 lb 3.2 oz (101.2 kg)   SpO2 93%   BMI 27.90 kg/m    Physical Exam:  General- No distress,  A&Ox3, pleasant ENT: No sinus tenderness, TM clear, pale nasal mucosa, no oral exudate,no post nasal drip, no LAN Cardiac: S1, S2, regular rate and rhythm, no murmur Chest: + LUL  wheeze/ no rales/ dullness; no accessory muscle use, no nasal flaring, no sternal retractions Abd.: Soft Non-tender, ND, BS +, Body mass index is 27.9 kg/m.  Ext: No clubbing cyanosis, edema Neuro:  normal strength, MAE x 4, A&O x 3 Skin: No rashes, warm and dry, No lesions  Psych: normal mood and behavior   Assessment/Plan COPD III Current every day smoker Saturations > 90% off his  oxygen >> Discontinued by Dr. Melvyn Novas 10/24 visit Saturations > 90% off his BiPAP >> Discontinued by Dr. Melvyn Novas 10/24 Visit Stable interval  Compliant with [facility] Continue using your Breztri 2 puffs twice daily Rinse mouth after use Use rescue inhaler as needed of shortness of breath or  wheezing.  Note your daily symptoms > remember "red flags" for COPD:  Increase in cough, increase in sputum production, increase in shortness of breath or activity intolerance. If you notice these symptoms, please call to be seen.    We will place a referral to the [facility] will get a call to schedule a Shared Decision Making Visit and to schedule your scan.  Please work on quitting smoking. Call 1-800-QUIT NOW for free nicotine replacement therapy ( Patches , gum or mints)  Follow up with Dr. Melvyn Novas in 6 months. [place], [place] if you need [place] sooner.  Please contact office for sooner follow up if symptoms do not improve or worsen or seek emergency care    Current Every Day smoker Plan Counseled to quit smoking Referred to Lung Cancer Screening Program 1-800-QUIT NOW for free nicotine replacement therapy ( Patches , gum or mints)   I spent 30 minutes dedicated to the care of this patient on the date of this encounter to include pre-visit review of records, face-to-face time with the patient discussing conditions above, post visit ordering of testing, clinical documentation with the electronic health record, making appropriate referrals as documented, and communicating necessary information to the patient's healthcare team.   Magdalen Spatz, NP 05/15/2021  10:13 AM

## 2021-05-15 NOTE — Patient Instructions (Addendum)
It is good to see you today Continue using your Breztri 2 puffs twice daily Rinse mouth after use Use rescue inhaler as needed of shortness of breath or wheezing.  Note your daily symptoms > remember "red flags" for COPD:  Increase in cough, increase in sputum production, increase in shortness of breath or activity intolerance. If you notice these symptoms, please call to be seen.    We will place a referral to the Lung Cancer Screening Program. You will get a call to schedule a Shared Decision Making Visit and to schedule your scan.  Please work on quitting smoking. Call Follow up with Dr. [name] in 6 months. Call, 1-800-QUIT NOW for free nicotine replacement therapy ( Patches , gum or mints)  Call us if you need [place] sooner.  Please contact office for sooner follow up if symptoms do not improve or worsen or seek emergency care

## 2021-05-18 ENCOUNTER — Telehealth: Payer: Self-pay | Admitting: Internal Medicine

## 2021-05-19 NOTE — Telephone Encounter (Signed)
MW please advise if ok to send a note to [employer] to have them pick up the pts POC.  They will not pick this up without an order.

## 2021-05-22 NOTE — Telephone Encounter (Signed)
Called and spoke with pt letting him know the info stated by Dr. [name] and he verbalized understanding. Scheduled pt for a sooner appt to have O2 needs reassessed to see if he still requires O2 on room air or if he would be okay without the O2 and also so he could further discuss all this with Dr. [name]. Nothing further needed.

## 2021-05-22 NOTE — Telephone Encounter (Signed)
I rec he saty on it for now as last visit he dropped well below 90% with walking so needs some form of ambulatory system unless he's  just staying home al the time - either way will need f/u ov with me or one of our NPs  if he decides to stop his daytime 02 so we can monitor him going forward (long term effects may not be immediatey apparent  If does stop rec: Make sure you check your oxygen saturation at your highest level of activity to be sure it stays over 90% and keep track of it at least once a week, more often if breathing getting worse, and let me know if losing ground.

## 2021-05-29 ENCOUNTER — Other Ambulatory Visit: Payer: Self-pay

## 2021-05-29 MED FILL — Albuterol Sulfate Inhal Aero 108 MCG/ACT (90MCG Base Equiv): RESPIRATORY_TRACT | 25 days supply | Qty: 18 | Fill #2 | Status: AC

## 2021-06-07 ENCOUNTER — Other Ambulatory Visit: Payer: Self-pay | Admitting: Family Medicine

## 2021-06-07 ENCOUNTER — Telehealth: Payer: Self-pay | Admitting: Internal Medicine

## 2021-06-07 DIAGNOSIS — I5042 Chronic combined systolic (congestive) and diastolic (congestive) heart failure: Secondary | ICD-10-CM

## 2021-06-07 DIAGNOSIS — I4892 Unspecified atrial flutter: Secondary | ICD-10-CM

## 2021-06-07 NOTE — Telephone Encounter (Signed)
Notes faxed to surgeon. This phone note will be removed from the preop pool. Tereso Newcomer, PA-C  06/07/2021 9:09 AM

## 2021-06-07 NOTE — Telephone Encounter (Signed)
° °  Patient Name:  Jared [name]  DOB:  03-20-1956  MRN:  [id number]   Primary Cardiologist: Dietrich Pates, MD  Chart reviewed as part of pre-operative protocol coverage.   Simple dental extractions are considered low risk procedures per guidelines and generally do not require any specific cardiac clearance. It is also generally accepted that for simple extractions and dental cleanings, there is no need to interrupt blood thinner therapy.  SBE prophylaxis is not required for the patient from a cardiac standpoint.  Please call with questions.  Tereso Newcomer, PA-C 06/07/2021, 9:06 AM

## 2021-06-07 NOTE — Telephone Encounter (Signed)
° °  Pre-operative Risk Assessment    Patient Name: Jared [name]  DOB: Mar 25, 1956 MRN: [id number]     Request for Surgical Clearance    Procedure:  Periodontal cleaning using Cavitron   Date of Surgery:  Clearance 06/07/21                                 Surgeon:  Rogers Seeds, Hygienist  Surgeon's Group or Practice Name:  [facility] Phone number:   Fax number:     Type of Clearance Requested:   - Medical    Type of Anesthesia:  None planned, may need local depending on patient needs    Additional requests/questions:  Does this patient need antibiotics?  Kandyce Rud   06/07/2021, 8:37 AM

## 2021-06-07 NOTE — Telephone Encounter (Signed)
Requested Prescriptions  Pending Prescriptions Disp Refills   XARELTO 20 MG TABS tablet [Pharmacy Med Name: XARELTO 20 MG Tablet] 90 tablet 1    Sig: TAKE 1 TABLET EVERY DAY WITH SUPPER     Hematology: Anticoagulants - rivaroxaban Failed - 06/07/2021  4:15 PM      Failed - AST in normal range and within 180 days    AST  Date Value Ref Range Status  02/13/2021 53 (H) 15 - 41 U/L Final         Failed - HGB in normal range and within 360 days    Hemoglobin  Date Value Ref Range Status  02/13/2021 12.1 (L) 13.0 - 17.0 g/dL Final  12/05/2020 8.4 (L) 13.0 - 17.7 g/dL Final         Failed - PLT in normal range and within 360 days    Platelets  Date Value Ref Range Status  02/13/2021 85 (L) 150 - 400 K/uL Final    Comment:    Immature Platelet Fraction may be clinically indicated, consider ordering this additional test [id number] REPEATED TO VERIFY PLATELET COUNT CONFIRMED BY SMEAR   12/05/2020 465 (H) 150 - 450 x10E3/uL Final         Passed - ALT in normal range and within 180 days    ALT  Date Value Ref Range Status  02/13/2021 25 0 - 44 U/L Final         Passed - Cr in normal range and within 360 days    Creat  Date Value Ref Range Status  07/30/2016 1.20 0.70 - 1.25 mg/dL Final    Comment:      For patients > or = 66 years of age: The upper reference limit for Creatinine is approximately 13% higher for people identified as African-American.      Creatinine, Ser  Date Value Ref Range Status  02/13/2021 0.79 0.61 - 1.24 mg/dL Final         Passed - HCT in normal range and within 360 days    HCT  Date Value Ref Range Status  02/13/2021 46.7 39.0 - 52.0 % Final   Hematocrit  Date Value Ref Range Status  12/05/2020 31.7 (L) 37.5 - 51.0 % Final         Passed - Valid encounter within last 12 months    Recent Outpatient Visits          3 months ago COPD GOLD III/still smoking    [place], Charlane Ferretti, MD   4 months ago  Epistaxis   [place], Charlane Ferretti, MD   6 months ago Acute gastric ulcer with hemorrhage   [place], Enobong, MD   11 months ago Impacted cerumen of right ear   [place], Enobong, MD   1 year ago Need for vaccination for zoster   [place], RPH-CPP      Future Appointments            In 2 weeks Tanda Rockers, MD [facility]            atorvastatin (LIPITOR) 80 MG tablet [Pharmacy Med Name: ATORVASTATIN CALCIUM 80 MG Tablet] 90 tablet 0    Sig: TAKE 1 TABLET EVERY DAY     Cardiovascular:  Antilipid - Statins Failed - 06/07/2021  4:15 PM  Failed - Total Cholesterol in normal range and within 360 days    Cholesterol, Total  Date Value Ref Range Status  01/29/2020 148 100 - 199 mg/dL Final         Failed - LDL in normal range and within 360 days    LDL Chol Calc (NIH)  Date Value Ref Range Status  01/29/2020 73 0 - 99 mg/dL Final         Failed - HDL in normal range and within 360 days    HDL  Date Value Ref Range Status  01/29/2020 51 >39 mg/dL Final         Failed - Triglycerides in normal range and within 360 days    Triglycerides  Date Value Ref Range Status  01/29/2020 138 0 - 149 mg/dL Final         Passed - Patient is not pregnant      Passed - Valid encounter within last 12 months    Recent Outpatient Visits          3 months ago COPD GOLD III/still smoking    [facility] [place], [place], MD   4 months ago Epistaxis   [facility] [place], [place], MD   6 months ago Acute gastric ulcer with hemorrhage   [facility] [place], Odette Horns, MD   11 months ago Impacted cerumen of right ear   [facility] [place], Odette Horns, MD   1 year ago Need for vaccination for  zoster   [facility] [place], RPH-CPP      Future Appointments            In 2 weeks [name], Charlaine [place], MD [facility]            furosemide (LASIX) 40 MG tablet [Pharmacy Med Name: FUROSEMIDE 40 MG Tablet] 90 tablet 0    Sig: TAKE 1 TABLET EVERY DAY     Cardiovascular:  Diuretics - Loop Passed - 06/07/2021  4:15 PM      Passed - K in normal range and within 360 days    Potassium  Date Value Ref Range Status  02/13/2021 4.2 3.5 - 5.1 mmol/L Final         Passed - Ca in normal range and within 360 days    Calcium  Date Value Ref Range Status  02/13/2021 9.0 8.9 - 10.3 mg/dL Final   Calcium, Ion  Date Value Ref Range Status  02/07/2021 1.10 (L) 1.15 - 1.40 mmol/L Final         Passed - Na in normal range and within 360 days    Sodium  Date Value Ref Range Status  02/13/2021 142 135 - 145 mmol/L Final  12/05/2020 145 (H) 134 - 144 mmol/L Final         Passed - Cr in normal range and within 360 days    Creat  Date Value Ref Range Status  07/30/2016 1.20 0.70 - 1.25 mg/dL Final    Comment:      For patients > or = 67 years of age: The upper reference limit for Creatinine is approximately 13% higher for people identified as African-American.      Creatinine, Ser  Date Value Ref Range Status  02/13/2021 0.79 0.61 - 1.24 mg/dL Final         Passed - Last BP in normal range  BP Readings from Last 1 Encounters:  05/15/21 138/62         Passed - Valid encounter within last 6 months    Recent Outpatient Visits          3 months ago COPD GOLD III/still smoking    [place], Charlane Ferretti, MD   4 months ago Epistaxis   [place], Charlane Ferretti, MD   6 months ago Acute gastric ulcer with hemorrhage   [place], Enobong, MD   11 months ago Impacted cerumen of right ear   [place], Enobong, MD   1 year ago Need for vaccination for zoster   [place], RPH-CPP      Future Appointments            In 2 weeks Melvyn Novas, Christena Deem, MD [facility]

## 2021-06-07 NOTE — Telephone Encounter (Signed)
Requested medications are due for refill today.  yes  Requested medications are on the active medications list.  yes  Last refill. Xarelto 02/21/2021, Atorvastatin 12/21/2020  Future visit scheduled.   no  Notes to clinic.  Both medications failed protocol d/t expired or abnormal labs    Requested Prescriptions  Pending Prescriptions Disp Refills   XARELTO 20 MG TABS tablet [Pharmacy Med Name: XARELTO 20 MG Tablet] 90 tablet 1    Sig: TAKE 1 TABLET EVERY DAY WITH SUPPER     Hematology: Anticoagulants - rivaroxaban Failed - 06/07/2021  4:15 PM      Failed - AST in normal range and within 180 days    AST  Date Value Ref Range Status  02/13/2021 53 (H) 15 - 41 U/L Final          Failed - HGB in normal range and within 360 days    Hemoglobin  Date Value Ref Range Status  02/13/2021 12.1 (L) 13.0 - 17.0 g/dL Final  63/06/6008 8.4 (L) 13.0 - 17.7 g/dL Final          Failed - PLT in normal range and within 360 days    Platelets  Date Value Ref Range Status  02/13/2021 85 (L) 150 - 400 K/uL Final    Comment:    Immature Platelet Fraction may be clinically indicated, consider ordering this additional test [id number] REPEATED TO VERIFY PLATELET COUNT CONFIRMED BY SMEAR   12/05/2020 465 (H) 150 - 450 x10E3/uL Final          Passed - ALT in normal range and within 180 days    ALT  Date Value Ref Range Status  02/13/2021 25 0 - 44 U/L Final          Passed - Cr in normal range and within 360 days    Creat  Date Value Ref Range Status  07/30/2016 1.20 0.70 - 1.25 mg/dL Final    Comment:      For patients > or = 67 years of age: The upper reference limit for Creatinine is approximately 13% higher for people identified as African-American.      Creatinine, Ser  Date Value Ref Range Status  02/13/2021 0.79 0.61 - 1.24 mg/dL Final          Passed - HCT in normal range and within 360 days    HCT  Date Value Ref Range Status  02/13/2021 46.7 39.0 - 52.0 % Final    Hematocrit  Date Value Ref Range Status  12/05/2020 31.7 (L) 37.5 - 51.0 % Final          Passed - Valid encounter within last 12 months    Recent Outpatient Visits           3 months ago COPD GOLD III/still smoking    [facility] [place], Odette Horns, MD   4 months ago Epistaxis   [facility] [place], Odette Horns, MD   6 months ago Acute gastric ulcer with hemorrhage   [facility] [name], MD   11 months ago Impacted cerumen of right ear   [facility] [name], MD   1 year ago Need for vaccination for zoster   [facility] [place], Cornelius Moras, RPH-CPP       Future Appointments             In 2 weeks Sandrea Hughs  B, MD [facility]             atorvastatin (LIPITOR) 80 MG tablet [Pharmacy Med Name: ATORVASTATIN CALCIUM 80 MG Tablet] 90 tablet 0    Sig: TAKE 1 TABLET EVERY DAY     Cardiovascular:  Antilipid - Statins Failed - 06/07/2021  4:15 PM      Failed - Total Cholesterol in normal range and within 360 days    Cholesterol, Total  Date Value Ref Range Status  01/29/2020 148 100 - 199 mg/dL Final          Failed - LDL in normal range and within 360 days    LDL Chol Calc (NIH)  Date Value Ref Range Status  01/29/2020 73 0 - 99 mg/dL Final          Failed - HDL in normal range and within 360 days    HDL  Date Value Ref Range Status  01/29/2020 51 >39 mg/dL Final          Failed - Triglycerides in normal range and within 360 days    Triglycerides  Date Value Ref Range Status  01/29/2020 138 0 - 149 mg/dL Final          Passed - Patient is not pregnant      Passed - Valid encounter within last 12 months    Recent Outpatient Visits           3 months ago COPD GOLD III/still smoking    [place], [place], MD   4 months ago Epistaxis   [place], [place], MD   6 months ago Acute gastric ulcer with hemorrhage   [place], Charlane Ferretti, MD   11 months ago Impacted cerumen of right ear   [place], Charlane Ferretti, MD   1 year ago Need for vaccination for zoster   [place], Stephen L, RPH-CPP       Future Appointments             In 2 weeks Melvyn Novas, Christena Deem, MD [facility]            Signed Prescriptions Disp Refills   furosemide (LASIX) 40 MG tablet 90 tablet 0    Sig: TAKE 1 TABLET EVERY DAY     Cardiovascular:  Diuretics - Loop Passed - 06/07/2021  4:15 PM      Passed - K in normal range and within 360 days    Potassium  Date Value Ref Range Status  02/13/2021 4.2 3.5 - 5.1 mmol/L Final          Passed - Ca in normal range and within 360 days    Calcium  Date Value Ref Range Status  02/13/2021 9.0 8.9 - 10.3 mg/dL Final   Calcium, Ion  Date Value Ref Range Status  02/07/2021 1.10 (L) 1.15 - 1.40 mmol/L Final          Passed - Na in normal range and within 360 days    Sodium  Date Value Ref Range Status  02/13/2021 142 135 - 145 mmol/L Final  12/05/2020 145 (H) 134 - 144 mmol/L Final          Passed - Cr in normal range and within 360 days    Creat  Date Value Ref Range Status  07/30/2016 1.20 0.70 - 1.25 mg/dL  Final    Comment:      For patients > or = 67 years of age: The upper reference limit for Creatinine is approximately 13% higher for people identified as African-American.      Creatinine, Ser  Date Value Ref Range Status  02/13/2021 0.79 0.61 - 1.24 mg/dL Final          Passed - Last BP in normal range    BP Readings from Last 1 Encounters:  05/15/21 138/62          Passed - Valid encounter within last 6 months    Recent Outpatient Visits           3 months ago COPD GOLD III/still smoking    [place], Charlane Ferretti, MD   4 months ago Epistaxis   [place], Charlane Ferretti, MD   6 months ago Acute gastric ulcer with hemorrhage   [place], [place], MD   11 months ago Impacted cerumen of right ear   [place], [place], MD   1 year ago Need for vaccination for zoster   [place], RPH-CPP       Future Appointments             In 2 weeks Melvyn Novas, Christena Deem, MD [facility]

## 2021-06-19 ENCOUNTER — Other Ambulatory Visit: Payer: Self-pay

## 2021-06-19 ENCOUNTER — Other Ambulatory Visit: Payer: Self-pay | Admitting: Family Medicine

## 2021-06-19 DIAGNOSIS — R040 Epistaxis: Secondary | ICD-10-CM

## 2021-06-19 DIAGNOSIS — G629 Polyneuropathy, unspecified: Secondary | ICD-10-CM

## 2021-06-19 MED ORDER — FLUTICASONE PROPIONATE 50 MCG/ACT NA SUSP
1.0000 | Freq: Every day | NASAL | 1 refills | Status: DC
  Filled 2021-06-19: qty 16, 30d supply, fill #0
  Filled 2022-01-04: qty 16, 30d supply, fill #1

## 2021-06-19 MED ORDER — GABAPENTIN 300 MG PO CAPS
ORAL_CAPSULE | Freq: Every day | ORAL | 2 refills | Status: DC
  Filled 2021-08-01: qty 30, 30d supply, fill #0
  Filled 2021-09-19: qty 30, 30d supply, fill #1
  Filled 2022-01-04: qty 30, 30d supply, fill #2

## 2021-06-19 NOTE — Telephone Encounter (Signed)
Medication Refill - Medication: Gabapentin 300 mg 1 time a day,  Fluticasone Propionate nasal spray   Has the patient contacted their pharmacy? Yes.  He tried several times (Agent: If no, request that the patient contact the pharmacy for the refill. If patient does not wish to contact the pharmacy document the reason why and proceed with request.) (Agent: If yes, when and what did the pharmacy advise?)  Preferred Pharmacy (with phone number or street name): [facility]   Has the patient been seen for an appointment in the last year OR does the patient have an upcoming appointment? Yes.    Agent: Please be advised that RX refills may take up to 3 business days. We ask that you follow-up with your pharmacy.

## 2021-06-19 NOTE — Telephone Encounter (Signed)
Requested Prescriptions  Pending Prescriptions Disp Refills   gabapentin (NEURONTIN) 300 MG capsule 30 capsule 2    Sig: TAKE 1 CAPSULE (300 MG TOTAL) BY MOUTH AT BEDTIME.     Neurology: Anticonvulsants - gabapentin Passed - 06/19/2021  9:34 AM      Passed - Valid encounter within last 12 months    Recent Outpatient Visits          3 months ago COPD GOLD III/still smoking    [place], Charlane Ferretti, MD   5 months ago Epistaxis   [place], Charlane Ferretti, MD   6 months ago Acute gastric ulcer with hemorrhage   [place], [place]ong, MD   1 year ago Impacted cerumen of right ear   [place], Enobong, MD   1 year ago Need for vaccination for zoster   [place], Stephen L, RPH-CPP      Future Appointments            In 1 week Tanda Rockers, MD [facility]            fluticasone (FLONASE) 50 MCG/ACT nasal spray 16 g 1    Sig: Place 1 spray into both nostrils daily.     Ear, Nose, and Throat: Nasal Preparations - Corticosteroids Passed - 06/19/2021  9:34 AM      Passed - Valid encounter within last 12 months    Recent Outpatient Visits          3 months ago COPD GOLD III/still smoking    [place], Charlane Ferretti, MD   5 months ago Epistaxis   [place], Charlane Ferretti, MD   6 months ago Acute gastric ulcer with hemorrhage   [place], En[place]ong, MD   1 year ago Impacted cerumen of right ear   [place], MD   1 year ago Need for vaccination for zoster   [place], RPH-CPP      Future Appointments            In 1 week Melvyn Novas Christena Deem, MD [facility]            Patient will need to make a follow up appointment for further refills.

## 2021-06-20 ENCOUNTER — Other Ambulatory Visit: Payer: Self-pay | Admitting: *Deleted

## 2021-06-20 DIAGNOSIS — Z87891 Personal history of nicotine dependence: Secondary | ICD-10-CM

## 2021-06-20 DIAGNOSIS — F17210 Nicotine dependence, cigarettes, uncomplicated: Secondary | ICD-10-CM

## 2021-06-21 ENCOUNTER — Other Ambulatory Visit: Payer: Self-pay

## 2021-06-27 ENCOUNTER — Other Ambulatory Visit: Payer: Self-pay

## 2021-06-27 ENCOUNTER — Ambulatory Visit (INDEPENDENT_AMBULATORY_CARE_PROVIDER_SITE_OTHER): Payer: Medicare HMO | Admitting: Internal Medicine

## 2021-06-27 ENCOUNTER — Encounter: Payer: Self-pay | Admitting: Internal Medicine

## 2021-06-27 DIAGNOSIS — E440 Moderate protein-calorie malnutrition: Secondary | ICD-10-CM | POA: Diagnosis not present

## 2021-06-27 DIAGNOSIS — J9612 Chronic respiratory failure with hypercapnia: Secondary | ICD-10-CM | POA: Diagnosis not present

## 2021-06-27 DIAGNOSIS — F17210 Nicotine dependence, cigarettes, uncomplicated: Secondary | ICD-10-CM

## 2021-06-27 DIAGNOSIS — J449 Chronic obstructive pulmonary disease, unspecified: Secondary | ICD-10-CM

## 2021-06-27 DIAGNOSIS — J9600 Acute respiratory failure, unspecified whether with hypoxia or hypercapnia: Secondary | ICD-10-CM | POA: Diagnosis not present

## 2021-06-27 DIAGNOSIS — J9611 Chronic respiratory failure with hypoxia: Secondary | ICD-10-CM | POA: Diagnosis not present

## 2021-06-27 NOTE — Patient Instructions (Signed)
The key is to stop smoking completely before smoking completely stops you!   Ok to try albuterol 15 min before an activity (on alternating days)  that you know would usually make you short of breath and see if it makes any difference and if makes none then don't take albuterol after activity unless you can't catch your breath as this means it's the resting that helps, not the albuterol.  Continue 02 at bedtime x 2lpm and adjust flow during the day with activity to maintain over 90%   Please schedule a follow up visit in 6 months but call sooner if needed

## 2021-06-27 NOTE — Progress Notes (Signed)
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 male with medical history significant for CABG, hypertension, prior history of stroke without residual, systolic heart failure, current tobacco abuse, chronic headaches, presenting to the emergency department with generalized weakness, and increased shortness of breath, along with  white sputum production. He also reports dyspnea on exertion. In addition, he reports epigastric abdominal pain, which may radiate to the lower portion of his sternum versus chest pain . He denies any lower extremity edema.  Admits to salt rich food ingestion .The symptoms are similar to those of one week ago, at which time, workup was suspicious for acute exacerbation of systolic heart failure. She was started on Cozaar, metoprolol and Lasix 20 mg daily at the time, and was recommended to see cardiology, which he failed to do so. His cardiologist is Dr. Dorris Carnes, last seen in December 2016.the patient continues to smoke 1 pack a day of cigarettes   Discharge Diagnoses:  Acute respiratory failure secondary to CHF exacerbation -O2 sats upon admission 89% -CXR unremarkable for infection -Responded well to IV lasix-->po lasix -Weaned off of O2-SpO2 95% on room air   Acute combined systolic/diastolic heart failure -BNP 2056.8 -Echocardiogram 09/20/2014 showed EF 40-45% -Transition to by mouth Lasix 20 mg daily -Echocardiogram 01/10/2016: EF [id number], diastolic dysfunction --AST 147, ALT 110 upon admission -Currently trending downward -hepatitis panel unremarkable -Patient denies drug/alcohol  use -switch metoprolol-->coreg -continue losartan   Essential Hypertension -Has not been taking home medications for over a year due to lack of insurance and monetary funds -Restarted on losartan,  Lasix --switch metoprolol-->coreg 6.25 mg bid   CAD -Currently no chest pain -Continue coreg, losartan, ASA 81 mg daily -LDL 80   Tobacco Abuse -Smoking cessation discussed -Continue nicotine patch   Moderate malnutrition -Nutrition consult   History of Present Illness  02/29/2016 1st [facility] office visit/ Kenyon Eshleman  symb 160 2bid/ bid saba (not prn)  Chief Complaint  Patient presents with   Pulmonary Consult    Pt referred by [name], PA, for cough, hemoptysis, and emphysema. Pt c/o hemoptysis x 3 weeks, pt states this occurs daily but has been improving. Pt c/o increase in SOB with activity and rest.   acute  onset epistaxis/ hemoptyis end of Auguest > ER eval 02/20/16 dx RLL pna by CTa chest > rx levaquin/ prednisone and minimal hemoptysis now  Doe = MMRC2 = can't walk a nl pace on a flat grade s sob but does fine slow and flat eg [employer] shopping rec Plan A = Automatic =  Symbicort 160 Take 2 puffs first thing in am and then another 2 puffs about 12 hours later.  Work on inhaler technique: Plan B = Backup Only use your albuterol as a rescue medication\ Plan C = Crisis - only use your albuterol nebulizer if you first try Plan B and it fails to help > ok to use the nebulizer up to every 4 hours but if start needing it regularly call for immediate appointment  The key is to stop smoking completely  before smoking completely stops you!  Please schedule a follow up office visit in 2 weeks, sooner if needed with cxr on return > did not return   Admit date: 05/04/2016 Discharge date: 05/09/2016     DISCHARGE DIAGNOSES:  Principal Problem:   Hemoptysis Active Problems:   Community acquired pneumonia of right lower lobe of lung ([place]) - - FOB RLL tbbx ATYPICAL PNEUMOCYTES  PROLIFERATION/ LLL BAL 05/08/16    Hypertension   COPD with acute exacerbation (HCC)   Nonischemic dilated cardiomyopathy (HCC)   Cigarette smoker   Chronic systolic CHF (congestive heart failure) ([place])   06/03/16 to ER> only better while on neb    06/26/2016  f/u ov/Robie Mcniel re:  GOLD III copd / maint symb 160 / saba  Chief Complaint  Patient presents with   Follow-up    pt had CT today. pt states breathing has worsen since last OV. pt c/o increased sob with exertion, prod cough with white mucus, wheezing & chest tightness,  breathing was worse w/in 2 days of last ov / then ran out of lasix one week ago and swelling started to get worse Also worse orthopnea ? Related to leg swelling in terms of timing but pt not sure  Doe now = MMRC3 = can't walk 100 yards even at a slow pace at a flat grade s stopping due to sob  rec Bisoprolol 5 mg one daily in place of the twice daily corevidol and resume your lasix and potassium  Plan A = Automatic = stop symbicort and start bevespi Take 2 puffs first thing in am and then another 2 puffs about 12 hours later.  Plan B = Backup Only use your albuterol as a rescue medication The key is to stop smoking completely before smoking completely stops you!  Please schedule a follow up office visit in 4 weeks, sooner if needed bring all meds      12/31/2018  f/u ov/Chelsei Mcchesney re: re establish re copd now [place] ? Some bladder problems on LAMA?  Chief Complaint  Patient presents with   Follow-up    Breathing seems slightly worse since the last visit. He is using his ventolin inhaler once per day and nebs about 2 x per day.   Dyspnea:  Worse for at least a year no better with 02/ some discomfort off 02 " like tightness "  Currently MMRC3 = can't walk 100 yards even at a slow pace at a flat grade s stopping due to sob even on 02 up to 4lpm  Cough: none  Sleeping: flat ok / no am cough /  SABA use: as above  Better p nebs/ over using  02: 3lpm 24/7  rec We need you  need you to monitor your 02 level to be sure it's above 90% Be sure protonix 40 mg Take 30-60 min before first meal of the day  We will ask [facility] to do BEST FIT analysis for longterm 02 The key is to stop smoking completely before smoking completely stops you! Add tudorza one puff twice daily  Only use your albuterol as a rescue medication  Only use nebulizer if you try the inhaler first  = change duoneb to pure albuterol 2.5 mg up to  every 4 hours is the max  Prednisone 10 mg take  4 each am x 2 days,   2 each am x 2 days,  1 each am x 2 days and stop  Please schedule a follow up office visit in  4 weeks, sooner if needed  with all medications /inhalers/ solutions in hand so we can verify exactly what you are taking. This includes all medications from all doctors and over the counters Add: f/u sinus dz by CT       04/04/2020  f/u ov/Analea Muller re:  GOLD III copd / 02 dep with ex and sleep  Chief Complaint  Patient presents with   Follow-up    No compaints or concerns with breathing   Dyspnea:  Maybe two aisles at grocery store  Cough: mostly in am's > gray just in am  Sleeping: bed is flat / one pillow  SABA use: maybe once a day/ very rarely neb  02:  3lpm but thinks it comes off/ 3lpm pulsing am   - not using at rest = 95% rec Plan A = Automatic = Always=    Breztri Take 2 puffs first thing in am and then another 2 puffs about 12 hours later.  Work on inhaler technique: Plan B = Backup (to supplement plan A, not to replace it) Only use your albuterol inhaler as a rescue medication Plan C = Crisis (instead of Plan B but only if Plan B stops working) - only use your albuterol nebulizer if you first try Plan B  Rec: 3lpm 24/7 but increase to 4lpm pulsed when walking outside of house Make sure you check your oxygen saturations at highest level of activity      04/03/2021  f/u ov/Tylie Golonka re: gold 3 copd/ 02 dep 24/7 - breztri 2bid  Chief Complaint  Patient presents with   Follow-up     Patient states SOB has worsened  Dyspnea:  50 ft on 4lpm  Cough: none  Sleeping: bed is flat one pillow/ on cpap now per hospital d/c but doesn't like it  SABA use: 3 x daily hfa/ neb sev times per week 02:  3lpm at rest,  4 lpm  activity / 3lpm Bipap  Covid status:   vax x 4  Rec Plan A = Automatic = Always= Breztri Take 2 puffs first thing in am and then another 2 puffs about 12 hours later.   Plan B = Backup (to supplement plan A, not to replace it) Only use your albuterol inhaler as a rescue medication Plan C = Crisis (instead of Plan B but only if Plan B stops working) - only use your albuterol nebulizer if you first try Plan B and it fails to help Mylanta 2 or maalox plus  - both have simethicone and will help the abdominal swelling as well as trying off the bipap.  Avoid gassy foods especially [place] food, boiled eggs, fresh / undercooked vegetables or salads like spinach  Make sure you check your oxygen saturation  at your highest level of activity   We will walk you today at your normal pace to see how much 02 you need = 2lpm walking nl pace Try off the bipap and let me know if any worse and I will refer you  to our sleep doctors       06/27/2021  f/u ov/Io Dieujuste re: GOLD 3 copd/ 02 dep maint on breztri / not using  02 at all   Chief Complaint  Patient presents with   Follow-up    Breathing is at baseline for him today. He needs to know if still needing supplemental o2.   Dyspnea:  very sedentary / grocery shopping/ uses [employer] parking  Cough: assoc with bloody nasal d/c, worse in am /  clears p hour / slt am HA  Sleeping: fine flat/ one pillow  SABA use: twice daily  02: not using 02 at all  Covid status:   vax x 4    No obvious day to day or daytime variability or assoc excess/ purulent sputum or mucus plugs or hemoptysis or cp or chest tightness, subjective wheeze or overt   hb symptoms.   sleeping without nocturnal  exacerbation  of respiratory  c/o's or need for noct saba.  Also denies any obvious fluctuation of symptoms with weather or environmental changes or other aggravating or alleviating factors except as outlined above   No unusual exposure hx or h/o childhood pna/ asthma or knowledge of premature birth.  Current Allergies, Complete Past Medical History, Past Surgical History, Family History, and Social History were reviewed in [employer] record.  ROS  The following are not active complaints unless bolded Hoarseness, sore throat, dysphagia, dental problems, itching, sneezing,  nasal congestion or discharge of excess mucus or purulent secretions, ear ache,   fever, chills, sweats, unintended wt loss or wt gain, classically pleuritic or exertional cp,  orthopnea pnd or arm/hand swelling  or leg swelling, presyncope, palpitations, abdominal pain, anorexia, nausea, vomiting, diarrhea  or change in bowel habits or change in bladder habits, change in stools or change in urine, dysuria, hematuria,  rash, arthralgias, visual complaints, headache, numbness, weakness or ataxia or problems with walking or coordination,  change in mood or  memory.        Current Meds  Medication Sig   albuterol (PROVENTIL) (2.5 MG/3ML) 0.083% nebulizer solution Take 3 mLs (2.5 mg total) by nebulization every 4 (four) hours as needed for wheezing or shortness of breath.   albuterol (VENTOLIN HFA) 108 (90 Base) MCG/ACT inhaler INHALE 2 PUFFS INTO THE LUNGS EVERY 6 (SIX) HOURS AS NEEDED FOR WHEEZING OR SHORTNESS OF BREATH.   atorvastatin (LIPITOR) 80 MG tablet TAKE 1 TABLET EVERY DAY   Budeson-Glycopyrrol-Formoterol (BREZTRI AEROSPHERE) 160-9-4.8 MCG/ACT AERO Take 2 puffs first thing in am and then another 2 puffs about 12 hours later.   ferrous sulfate 325 (65 FE) MG tablet Take 1 tablet (325 mg total) by mouth 2 (two) times daily with a meal.   fluticasone (FLONASE) 50 MCG/ACT nasal spray Place 1 spray into both nostrils daily. Patient will need to make a follow up  appointment for further refills.   furosemide (LASIX) 40 MG tablet TAKE 1 TABLET EVERY DAY   gabapentin (NEURONTIN) 300 MG capsule TAKE 1 CAPSULE (300 MG TOTAL) BY MOUTH AT BEDTIME.   Multiple Vitamin (MULTIVITAMIN WITH MINERALS) TABS tablet Take 1 tablet by mouth daily.   nitroGLYCERIN (NITROSTAT) 0.4 MG SL tablet Place 1 tablet (0.4 mg total) under the tongue every 5 (five) minutes as needed for chest pain.   rivaroxaban (XARELTO) 20 MG TABS tablet TAKE 1 TABLET EVERY DAY WITH SUPPER   traMADol (ULTRAM) 50 MG tablet Take 1 tablet (50 mg total) by mouth every 6 (six) hours as needed for moderate pain.                       Objective:   Physical Exam   Wts  06/27/2021      226  04/03/2021    224  10/04/2020      217 04/04/2020    212 06/30/2019      189  01/28/2019      191 12/31/2018      189  10/22/2016      190  07/24/2016        209  06/26/2016        213  05/28/2016      194   02/29/16 177 lb 12.8 oz (80.6 kg)  02/24/16 182 lb 12.8 oz (82.9 kg)  02/20/16 175 lb (79.4 kg)     Vital signs reviewed  06/27/2021  - Note at rest 02 sats  90% on RA   General appearance:    amb bm nad    HEENT : pt wearing mask not removed for exam due to covid -19 concerns.    NECK :  without JVD/Nodes/TM/ nl carotid upstrokes bilaterally   LUNGS: no acc muscle use,  Mod barrel  contour chest wall with bilateral  Distant bs s audible wheeze and  without cough on insp or exp maneuvers and mod  Hyperresonant  to  percussion bilaterally     CV:  RRR  no s3 or murmur or increase in P2, and no edema   ABD:  soft and nontender with pos mid insp Hoover's  in the supine position. No bruits or organomegaly appreciated, bowel sounds nl  MS:     ext warm without deformities, calf tenderness, cyanosis or clubbing No obvious joint restrictions   SKIN: warm and dry without lesions    NEURO:  alert, approp, nl sensorium with  no motor or cerebellar deficits apparent.            I personally  reviewed images and agree with radiology impression as follows:   Chest CTa 02/13/21  1. Negative for acute pulmonary embolus or aortic dissection. 2. Emphysema without acute airspace disease 3. Slightly enlarged pulmonary trunk and pulmonary arteries suggestive of arterial hypertension.        Assessment & Plan:

## 2021-06-28 ENCOUNTER — Other Ambulatory Visit: Payer: Self-pay

## 2021-06-28 ENCOUNTER — Encounter: Payer: Self-pay | Admitting: Internal Medicine

## 2021-06-28 NOTE — Assessment & Plan Note (Signed)
Active smoker 02/29/2016  After extensive coaching HFA effectiveness =    75% > continue symbicort  - Spirometry 05/28/2016  FEV1 1.48 (40%)  Ratio 46  p am symbicort 160 2bid  - 06/26/2016  After extensive coaching HFA effectiveness =    90% > bevespi 2 bid and change coreg to bisoprolol 5 mg daily > not done as of 07/24/2016 > at ov 10/22/2016 off coreg and all inhalers and much better sob but still some cough on entresto maint - Spirometry 10/22/2016  FEV1 1.24 (35%)  Ratio 48 - 12/31/2018    tudorza rx one puff bid  - 01/28/2019  After extensive coaching inhaler device,  effectiveness =    90% with tudorza  03/09/2019 Reports improvement with Tudorza but not covered, refer to [facility]. May need PA processed- he has been on bevespi and spiriva in the past  - 06/30/2019  After extensive coaching inhaler device,  effectiveness =    90% with elipta try anoro one each am  PFT's  04/04/2020  FEV1 1.25 (35 % ) ratio 0.41  p 6 % improvement from saba p tudorza prior to study with DLCO  8.26 (27%) corrects to 1.38 (34%)  for alv volume and FV curve classic concave pattern  - 04/04/2020   breztri 2bid  - 04/03/2021  After extensive coaching inhaler device,  effectiveness =    90%    Group D in terms of symptom/risk and laba/lama/ICS  therefore appropriate rx at this point >>>  Continue breztri and approp saba   Re SABA :  I spent extra time with pt today reviewing appropriate use of albuterol for prn use on exertion with the following points: 1) saba is for relief of sob that does not improve by walking a slower pace or resting but rather if the pt does not improve after trying this first. 2) If the pt is convinced, as many are, that saba helps recover from activity faster then it's easy to tell if this is the case by re-challenging : ie stop, take the inhaler, then p 5 minutes try the exact same activity (intensity of workload) that just caused the symptoms and see if they are substantially diminished or not after  saba 3) if there is an activity that reproducibly causes the symptoms, try the saba 15 min before the activity on alternate days   If in fact the saba really does help, then fine to continue to use it prn but advised may need to look closer at the maintenance regimen being used to achieve better control of airways disease with exertion.     

## 2021-06-28 NOTE — Assessment & Plan Note (Signed)
Counseled re importance of smoking cessation but did not meet time criteria for separate billing    Each maintenance medication was reviewed in detail including emphasizing most importantly the difference between maintenance and prns and under what circumstances the prns are to be triggered using an action plan format where appropriate.  Total time for H and P, chart review, counseling, reviewing hfa/ 02 device(s) , directly observing portions of ambulatory 02 saturation study/ and generating customized AVS unique to this office visit / same day charting > 30 min               

## 2021-06-28 NOTE — Assessment & Plan Note (Addendum)
See copd - 10/22/2016  Walked RA x 3 laps @ 185 ft each stopped due to  End of study moderate pace, no desat  - HCO03  11/11/18  = 41  - 12/31/2018   Walked 2lpm   2 laps @  approx 212ft each @ avg pace  stopped due to  Sob on 2lpm but no desats   - 01/28/2019   Walked RA x one lap =  approx 250 ft - stopped due to  Sob with sats ok on 3lpm POC > referred for POC  - HCO3  06/15/19 = 38  -  04/04/2020   Walked 3lpm  pulsed  3laps @ approx 266ft each @avg  pace  stopped due desats p second lap to 87% corrected on 4lpm pulsed   With sats 98% at end  - HCO3  02/13/21  = 41  - 04/03/2021   Walked on RA  desat to 84% p 250 ft   Then 250 ft on 2lpm cont avg pace sats 92% at lowest - 06/27/2021 Patient Saturations on Room Air at Rest =95%  while Ambulating = 88% and  on 3 Liters of oxygen while Ambulating = 95%   rec back on 3lpm sleeping and exertion but also advised: Make sure you check your oxygen saturation  AT  your highest level of activity (not after you stop)   to be sure it stays over 90% and adjust  02 flow upward to maintain this level if needed but remember to turn it back to previous settings when you stop (to conserve your supply).

## 2021-07-11 ENCOUNTER — Encounter: Payer: Medicare HMO | Admitting: Acute Care

## 2021-07-12 ENCOUNTER — Ambulatory Visit
Admission: RE | Admit: 2021-07-12 | Discharge: 2021-07-12 | Disposition: A | Discharge status: Home / Self Care | Payer: Medicare HMO | Source: Ambulatory Visit | Attending: Acute Care | Admitting: Acute Care

## 2021-07-12 ENCOUNTER — Other Ambulatory Visit: Payer: Self-pay

## 2021-07-12 DIAGNOSIS — F17210 Nicotine dependence, cigarettes, uncomplicated: Secondary | ICD-10-CM | POA: Diagnosis not present

## 2021-07-12 DIAGNOSIS — Z87891 Personal history of nicotine dependence: Secondary | ICD-10-CM

## 2021-07-14 ENCOUNTER — Other Ambulatory Visit: Payer: Self-pay

## 2021-07-14 DIAGNOSIS — Z87891 Personal history of nicotine dependence: Secondary | ICD-10-CM

## 2021-07-14 DIAGNOSIS — F17210 Nicotine dependence, cigarettes, uncomplicated: Secondary | ICD-10-CM

## 2021-08-01 ENCOUNTER — Other Ambulatory Visit: Payer: Self-pay

## 2021-08-28 ENCOUNTER — Other Ambulatory Visit: Payer: Self-pay

## 2021-09-19 ENCOUNTER — Other Ambulatory Visit: Payer: Self-pay | Admitting: Family Medicine

## 2021-09-19 ENCOUNTER — Other Ambulatory Visit: Payer: Self-pay

## 2021-09-19 DIAGNOSIS — J449 Chronic obstructive pulmonary disease, unspecified: Secondary | ICD-10-CM

## 2021-09-20 ENCOUNTER — Other Ambulatory Visit: Payer: Self-pay

## 2021-09-20 ENCOUNTER — Encounter: Payer: Self-pay | Admitting: Physician Assistant

## 2021-09-20 ENCOUNTER — Ambulatory Visit: Payer: Medicare HMO | Attending: Physician Assistant | Admitting: Physician Assistant

## 2021-09-20 ENCOUNTER — Other Ambulatory Visit (HOSPITAL_COMMUNITY)
Admission: RE | Admit: 2021-09-20 | Discharge: 2021-09-20 | Disposition: A | Discharge status: Home / Self Care | Payer: Medicare HMO | Source: Ambulatory Visit | Attending: Physician Assistant | Admitting: Physician Assistant

## 2021-09-20 VITALS — BP 128/86 | HR 96 | Resp 16 | Wt 228.4 lb

## 2021-09-20 DIAGNOSIS — M25561 Pain in right knee: Secondary | ICD-10-CM

## 2021-09-20 DIAGNOSIS — R140 Abdominal distension (gaseous): Secondary | ICD-10-CM

## 2021-09-20 DIAGNOSIS — M25562 Pain in left knee: Secondary | ICD-10-CM

## 2021-09-20 DIAGNOSIS — N50812 Left testicular pain: Secondary | ICD-10-CM | POA: Diagnosis not present

## 2021-09-20 DIAGNOSIS — G8929 Other chronic pain: Secondary | ICD-10-CM | POA: Diagnosis not present

## 2021-09-20 DIAGNOSIS — L309 Dermatitis, unspecified: Secondary | ICD-10-CM

## 2021-09-20 DIAGNOSIS — R040 Epistaxis: Secondary | ICD-10-CM | POA: Diagnosis not present

## 2021-09-20 LAB — POCT URINALYSIS DIP (CLINITEK)
Bilirubin, UA: NEGATIVE
Blood, UA: NEGATIVE
Glucose, UA: NEGATIVE mg/dL
Ketones, POC UA: NEGATIVE mg/dL
Leukocytes, UA: NEGATIVE
Nitrite, UA: NEGATIVE
POC PROTEIN,UA: NEGATIVE
Spec Grav, UA: 1.02 (ref 1.010–1.025)
Urobilinogen, UA: 0.2 E.U./dL
pH, UA: 5 (ref 5.0–8.0)

## 2021-09-20 MED ORDER — TRIAMCINOLONE ACETONIDE 0.1 % EX CREA
1.0000 "application " | TOPICAL_CREAM | Freq: Two times a day (BID) | CUTANEOUS | 1 refills | Status: DC
  Filled 2021-09-20: qty 45, 23d supply, fill #0
  Filled 2022-01-04: qty 45, 23d supply, fill #1

## 2021-09-20 MED ORDER — ALBUTEROL SULFATE HFA 108 (90 BASE) MCG/ACT IN AERS
2.0000 | INHALATION_SPRAY | Freq: Four times a day (QID) | RESPIRATORY_TRACT | 5 refills | Status: DC | PRN
  Filled 2021-09-20: qty 18, 25d supply, fill #0
  Filled 2021-10-31: qty 18, 25d supply, fill #1
  Filled 2022-02-02: qty 6.7, 25d supply, fill #2
  Filled 2022-03-22 – 2022-05-01 (×2): qty 18, 25d supply, fill #3

## 2021-09-20 NOTE — Progress Notes (Signed)
Patient ID: Jared [name], male   DOB: 05-May-1956, 66 y.o.   MRN: [id number] ? ? ?Ison Gaida, is a 66 y.o. male ? ?[id number] ? ?[id number] ? ?DOB - 1956/03/24 ? ?Chief Complaint  ?Patient presents with  ? Referral  ?  Ortho  ?Knee pain  ?  ?    ? ?Subjective:  ? ?Jared [name] is a 66 y.o. male here today for rash that itched L elbow.  He has had it for about 3-4 months and it is itching more.  He has tried OTC lotion and vaseline without success.   ? ?Also c/o shooting pain occasionally in his penis or L testicle.  This has been going on about 6 months.  No penile discharge or dysuria.  No blood in urine.   ? ?He is also c/o that when he eats, he gets full and bloated after about 4 fork fulls.  He denies melena/hematochezia.  No N/V/D.  Appetite is good.   ? ?Needs referral for B knee pain.  He has been told he has "bone on bone" ? ?Also, has has O2 tank.  He gets small nose bleeds sometimes.  These are light and stop with dabbing/pressure(not heavy/unstoppable) ? ? ? ?No problems updated. ? ?ALLERGIES: ?Allergies  ?Allergen Reactions  ? Lisinopril Cough  ? ? ?PAST MEDICAL HISTORY: ?Past Medical History:  ?Diagnosis Date  ? AVM (arteriovenous malformation)   ? CAD (coronary artery disease)   ? a. LHC 5/12:  LAD 20, pLCx 20, pRCA 40, dRCA 40, EF 35%, diff HK  //  b. Myoview 4/16: Overall Impression:  High risk stress nuclear study There is no evidence of ischemia.  There is severe LV dysfunction. LV Ejection Fraction: 30%.  LV Wall Motion:  There is global LV hypokinesis.    ? CAP (community acquired pneumonia) 09/2013  ? Chronic combined systolic and diastolic CHF (congestive heart failure) ([place])   ? a. Echo 4/16:Mild LVH, EF 40-45%, diffuse HK //  b. Echo 8/17: EF 35-40%, diffuse HK, diastolic dysfunction, aortic sclerosis, trivial MR, moderate LAE, normal RVSF, moderate RAE, mild TR, PASP 42 mmHg // c. Echo 4/18: Mild concentric LVH, EF 30-35, normal wall motion, grade 1 diastolic dysfunction, PASP 49  ?  Chronic respiratory failure ([place])   ? Cluster headache   ? "hx; haven't had one in awhile" (01/09/2016)  ? COPD (chronic obstructive pulmonary disease) ([place])   ? [name] 01/09/2016  ? History of CVA (cerebrovascular accident)   ? Hypertension   ? IDA (iron deficiency anemia)   ? Moderate tobacco use disorder   ? NICM (nonischemic cardiomyopathy) ([place])   ? Nicotine addiction   ? Tobacco abuse   ? ? ?MEDICATIONS AT HOME: ?Prior to Admission medications   ?Medication Sig Start Date End Date Taking? Authorizing Provider  ?triamcinolone cream (KENALOG) 0.1 % Apply 1 application. topically 2 (two) times daily. 09/20/21  Yes Argentina Donovan, PA-C  ?albuterol (PROVENTIL) (2.5 MG/3ML) 0.083% nebulizer solution Take 3 mLs (2.5 mg total) by nebulization every 4 (four) hours as needed for wheezing or shortness of breath. 11/01/20   Patriciaann Clan, DO  ?albuterol (VENTOLIN HFA) 108 (90 Base) MCG/ACT inhaler INHALE 2 PUFFS INTO THE LUNGS EVERY 6 (SIX) HOURS AS NEEDED FOR WHEEZING OR SHORTNESS OF BREATH. 08/15/20 08/15/21  Charlott Rakes, MD  ?atorvastatin (LIPITOR) 80 MG tablet TAKE 1 TABLET EVERY DAY 06/08/21   Charlott Rakes, MD  ?Budeson-Glycopyrrol-Formoterol (BREZTRI AEROSPHERE) 160-9-4.8 MCG/ACT AERO Take 2 puffs first thing in am  and then another 2 puffs about 12 hours later. 05/01/21   Tanda Rockers, MD  ?ferrous sulfate 325 (65 FE) MG tablet Take 1 tablet (325 mg total) by mouth 2 (two) times daily with a meal. 11/16/20   Nicole Kindred A, DO  ?fluticasone (FLONASE) 50 MCG/ACT nasal spray Place 1 spray into both nostrils daily. Patient will need to make a follow up appointment for further refills. 06/19/21   Charlott Rakes, MD  ?furosemide (LASIX) 40 MG tablet TAKE 1 TABLET EVERY DAY 06/07/21   Charlott Rakes, MD  ?gabapentin (NEURONTIN) 300 MG capsule TAKE 1 CAPSULE (300 MG TOTAL) BY MOUTH AT BEDTIME. 06/19/21 06/19/22  Charlott Rakes, MD  ?Multiple Vitamin (MULTIVITAMIN WITH MINERALS) TABS tablet Take 1 tablet by mouth  daily. 11/16/20   Ezekiel Slocumb, DO  ?nitroGLYCERIN (NITROSTAT) 0.4 MG SL tablet Place 1 tablet (0.4 mg total) under the tongue every 5 (five) minutes as needed for chest pain. 11/19/20 11/19/21  Florencia Reasons, MD  ?rivaroxaban (XARELTO) 20 MG TABS tablet TAKE 1 TABLET EVERY DAY WITH SUPPER 06/08/21   Charlott Rakes, MD  ?traMADol (ULTRAM) 50 MG tablet Take 1 tablet (50 mg total) by mouth every 6 (six) hours as needed for moderate pain. 02/11/21   Domenic Polite, MD  ?[name] PRESSAIR 400 MCG/ACT AEPB INHALE 1 PUFF INTO THE LUNGS IN THE MORNING AND AT BEDTIME. 03/29/20 04/04/20  Tanda Rockers, MD  ? ? ?ROS: ?Neg resp ?Neg cardiac ?Neg psych ?Neg neuro ? ?Objective:  ? ?Vitals:  ? 09/20/21 1540  ?BP: 128/86  ?Pulse: 96  ?Resp: 16  ?SpO2: 94%  ?Weight: 228 lb 6.4 oz (103.6 kg)  ? ?Exam ?General appearance : Awake, alert, not in any distress. Speech Clear. Not toxic looking ?HEENT: Atraumatic and Normocephalic.  Nares B inflamed.  Small scab in L nostril ?Neck: Supple, no JVD. No cervical lymphadenopathy.  ?Chest: Good air entry bilaterally, CTAB.  No rales/rhonchi/wheezing ?CVS: S1 S2 regular, no murmurs.  ?Abdomen-benign ?Extremities: B/L Lower Ext shows no edema, both legs are warm to touch ?Neurology: Awake alert, and oriented X 3, CN II-XII intact, Non focal ?Skin: eczema on L elbow ? ?Data Review ?Lab Results  ?Component Value Date  ? HGBA1C 5.7 (H) 11/15/2020  ? HGBA1C 5.6 05/18/2019  ? HGBA1C 6.3 05/14/2016  ? ? ?Assessment & Plan  ? ?1. Chronic pain of both knees ?- Ambulatory referral to Orthopedic Surgery ? ?2. Abdominal bloating ?- Lipase ?- H. pylori breath test ?- CBC with Differential/Platelet ?- Lipase ?- Comprehensive metabolic panel ? ?3. Pain in left testicle ?- POCT URINALYSIS DIP (CLINITEK) ?- Urine cytology ancillary only ? ?4. Eczema, unspecified type ?- triamcinolone cream (KENALOG) 0.1 %; Apply 1 application. topically 2 (two) times daily.  Dispense: 45 g; Refill: 1 ? ?5. Epistaxis ?Saline nasal  sprays ?- Ambulatory referral to ENT ? ? ? ?Patient have been counseled extensively about nutrition and exercise. Other issues discussed during this visit include: low cholesterol diet, weight control and daily exercise, foot care, annual eye examinations at Ophthalmology, importance of adherence with medications and regular follow-up. We also discussed long term complications of uncontrolled diabetes and hypertension.  ? ?Return in about 3 months (around 12/20/2021) for pcp for chronic conditions. ? ?The patient was given clear instructions to go to ER or return to medical center if symptoms don't improve, worsen or new problems develop. The patient verbalized understanding. The patient was told to call to get lab results if they haven't heard  anything in the next week.  ? ? ? ? ?Freeman Caldron, PA-C ?[place] ?[place], [place] ?[phone]   ?09/20/2021, 4:14 PM  ?

## 2021-09-20 NOTE — Telephone Encounter (Signed)
Requested medication (s) are due for refill today: yes ? ?Requested medication (s) are on the active medication list: yes ? ?Last refill:  08/15/20 18g with 5 RF ? ?Future visit scheduled: TODAY ? ?Notes to clinic:  Please assess at today's visit. ? ? ?  ? ?Requested Prescriptions  ?Pending Prescriptions Disp Refills  ? albuterol (VENTOLIN HFA) 108 (90 Base) MCG/ACT inhaler 18 g 5  ?  Sig: INHALE 2 PUFFS INTO THE LUNGS EVERY 6 (SIX) HOURS AS NEEDED FOR WHEEZING OR SHORTNESS OF BREATH.  ?  ? Pulmonology:  Beta Agonists 2 Passed - 09/19/2021 12:26 PM  ?  ?  Passed - Last BP in normal range  ?  BP Readings from Last 1 Encounters:  ?06/27/21 134/82  ?  ?  ?  ?  Passed - Last Heart Rate in normal range  ?  Pulse Readings from Last 1 Encounters:  ?06/27/21 96  ?  ?  ?  ?  Passed - Valid encounter within last 12 months  ?  Recent Outpatient Visits   ? ?      ? 7 months ago COPD GOLD III/still smoking   ? [facility] [place], Odette Horns, MD  ? 8 months ago Epistaxis  ? [facility] [place], Odette Horns, MD  ? 9 months ago Acute gastric ulcer with hemorrhage  ? [facility] [name], MD  ? 1 year ago Impacted cerumen of right ear  ? [facility] [place], Odette Horns, MD  ? 2 years ago Need for vaccination for zoster  ? [facility] [place], Cornelius Moras, RPH-CPP  ? ?  ?  ?Future Appointments   ? ?        ? Today Sharon Seller, [place] [facility]  ? In 4 months Pricilla Riffle, MD [facility], [facility]  ? ?  ? ?  ?  ?  ? ? ?

## 2021-09-21 ENCOUNTER — Other Ambulatory Visit: Payer: Self-pay

## 2021-09-21 LAB — URINE CYTOLOGY ANCILLARY ONLY
Chlamydia: NEGATIVE
Comment: NEGATIVE
Comment: NEGATIVE
Comment: NORMAL
Neisseria Gonorrhea: NEGATIVE
Trichomonas: NEGATIVE

## 2021-09-21 LAB — CBC WITH DIFFERENTIAL/PLATELET
Basophils Absolute: 0.1 10*3/uL (ref 0.0–0.2)
Basos: 1 %
EOS (ABSOLUTE): 0.1 10*3/uL (ref 0.0–0.4)
Eos: 1 %
Hematocrit: 53.6 % — ABNORMAL HIGH (ref 37.5–51.0)
Hemoglobin: 17.1 g/dL (ref 13.0–17.7)
Immature Grans (Abs): 0 10*3/uL (ref 0.0–0.1)
Immature Granulocytes: 0 %
Lymphocytes Absolute: 3 10*3/uL (ref 0.7–3.1)
Lymphs: 34 %
MCH: 28 pg (ref 26.6–33.0)
MCHC: 31.9 g/dL (ref 31.5–35.7)
MCV: 88 fL (ref 79–97)
Monocytes Absolute: 0.9 10*3/uL (ref 0.1–0.9)
Monocytes: 10 %
Neutrophils Absolute: 4.9 10*3/uL (ref 1.4–7.0)
Neutrophils: 54 %
Platelets: 236 10*3/uL (ref 150–450)
RBC: 6.11 x10E6/uL — ABNORMAL HIGH (ref 4.14–5.80)
RDW: 12.8 % (ref 11.6–15.4)
WBC: 9 10*3/uL (ref 3.4–10.8)

## 2021-09-21 LAB — COMPREHENSIVE METABOLIC PANEL
ALT: 32 IU/L (ref 0–44)
AST: 31 IU/L (ref 0–40)
Albumin/Globulin Ratio: 2 (ref 1.2–2.2)
Albumin: 5 g/dL — ABNORMAL HIGH (ref 3.8–4.8)
Alkaline Phosphatase: 80 IU/L (ref 44–121)
BUN/Creatinine Ratio: 18 (ref 10–24)
BUN: 22 mg/dL (ref 8–27)
Bilirubin Total: 0.3 mg/dL (ref 0.0–1.2)
CO2: 26 mmol/L (ref 20–29)
Calcium: 10.2 mg/dL (ref 8.6–10.2)
Chloride: 99 mmol/L (ref 96–106)
Creatinine, Ser: 1.21 mg/dL (ref 0.76–1.27)
Globulin, Total: 2.5 g/dL (ref 1.5–4.5)
Glucose: 102 mg/dL — ABNORMAL HIGH (ref 70–99)
Potassium: 5.3 mmol/L — ABNORMAL HIGH (ref 3.5–5.2)
Sodium: 142 mmol/L (ref 134–144)
Total Protein: 7.5 g/dL (ref 6.0–8.5)
eGFR: 66 mL/min/{1.73_m2} (ref >59)

## 2021-09-21 LAB — LIPASE: Lipase: 41 U/L (ref 13–78)

## 2021-09-21 LAB — H. PYLORI BREATH TEST: H pylori Breath Test: NEGATIVE

## 2021-09-22 ENCOUNTER — Other Ambulatory Visit: Payer: Self-pay

## 2021-09-29 ENCOUNTER — Other Ambulatory Visit: Payer: Self-pay

## 2021-10-02 ENCOUNTER — Other Ambulatory Visit: Payer: Self-pay

## 2021-10-02 ENCOUNTER — Ambulatory Visit (INDEPENDENT_AMBULATORY_CARE_PROVIDER_SITE_OTHER): Payer: Medicare HMO

## 2021-10-02 ENCOUNTER — Ambulatory Visit (INDEPENDENT_AMBULATORY_CARE_PROVIDER_SITE_OTHER): Payer: Medicare HMO | Admitting: Orthopaedic Surgery

## 2021-10-02 ENCOUNTER — Ambulatory Visit: Payer: Self-pay

## 2021-10-02 DIAGNOSIS — M25562 Pain in left knee: Secondary | ICD-10-CM

## 2021-10-02 DIAGNOSIS — M25561 Pain in right knee: Secondary | ICD-10-CM

## 2021-10-02 DIAGNOSIS — M1712 Unilateral primary osteoarthritis, left knee: Secondary | ICD-10-CM

## 2021-10-02 DIAGNOSIS — G8929 Other chronic pain: Secondary | ICD-10-CM

## 2021-10-02 DIAGNOSIS — M170 Bilateral primary osteoarthritis of knee: Secondary | ICD-10-CM | POA: Diagnosis not present

## 2021-10-02 DIAGNOSIS — M1711 Unilateral primary osteoarthritis, right knee: Secondary | ICD-10-CM

## 2021-10-02 MED ORDER — LIDOCAINE HCL 1 % IJ SOLN
3.0000 mL | INTRAMUSCULAR | Status: AC | PRN
  Administered 2021-10-02: 3 mL

## 2021-10-02 MED ORDER — METHYLPREDNISOLONE ACETATE 40 MG/ML IJ SUSP
40.0000 mg | INTRAMUSCULAR | Status: AC | PRN
  Administered 2021-10-02: 40 mg via INTRA_ARTICULAR

## 2021-10-02 NOTE — Progress Notes (Signed)
? ?Office Visit Note ?  ?Patient: Jared [name]           ?Date of Birth: [birth year]           ?MRN: [id number] ?Visit Date: 10/02/2021 ?             ?Requested by: Charlott Rakes, MD ?[address] ?[address] ?[place],  [place] [address] ?PCP: Charlott Rakes, MD ? ? ?Assessment & Plan: ?Visit Diagnoses:  ?1. Chronic pain of left knee   ?2. Chronic pain of right knee   ?3. Unilateral primary osteoarthritis, right knee   ?4. Unilateral primary osteoarthritis, left knee   ? ? ?Plan: I am at a loss of what else to try for the patient other conservative treatment such as steroid injections in his knees.  He does not have health insurance so hyaluronic acid is too expensive.  He is also very noncompliant with his medical status.  He is on oxygen and still smoking.  He has atrial fibrillation and is off of Xarelto on his own.  However he does have cardiology follow-up in the future.  I did place a steroid injection in both knees today.  He can certainly have these again about every 4 months if needed.  Follow-up is as needed. ? ?Follow-Up Instructions: Return if symptoms worsen or fail to improve.  ? ?Orders:  ?Orders Placed This Encounter  ?Procedures  ? Large Joint Inj  ? Large Joint Inj  ? XR Knee 1-2 Views Right  ? XR Knee 1-2 Views Left  ? ?No orders of the defined types were placed in this encounter. ? ? ? ? Procedures: ?Large Joint Inj: R knee on 10/02/2021 9:02 AM ?Indications: diagnostic evaluation and pain ?Details: 22 G 1.5 in needle, superolateral approach ? ?Arthrogram: No ? ?Medications: 3 mL lidocaine 1 %; 40 mg methylPREDNISolone acetate 40 MG/ML ?Outcome: tolerated well, no immediate complications ?Procedure, treatment alternatives, risks and benefits explained, specific risks discussed. Consent was given by the patient. Immediately prior to procedure a time out was called to verify the correct patient, procedure, equipment, support staff and site/side marked as required. Patient was prepped and draped in  the usual sterile fashion.  ? ? ?Large Joint Inj: L knee on 10/02/2021 9:02 AM ?Indications: diagnostic evaluation and pain ?Details: 22 G 1.5 in needle, superolateral approach ? ?Arthrogram: No ? ?Medications: 3 mL lidocaine 1 %; 40 mg methylPREDNISolone acetate 40 MG/ML ?Outcome: tolerated well, no immediate complications ?Procedure, treatment alternatives, risks and benefits explained, specific risks discussed. Consent was given by the patient. Immediately prior to procedure a time out was called to verify the correct patient, procedure, equipment, support staff and site/side marked as required. Patient was prepped and draped in the usual sterile fashion.  ? ? ? ? ?Clinical Data: ?No additional findings. ? ? ?Subjective: ?Chief Complaint  ?Patient presents with  ? Right Knee - Pain  ? Left Knee - Pain  ?The patient is someone we have seen before.  He is 66 years old and has chronic pain in both his knees.  He last had a steroid injection in both knees back in November of last year.  He is on oxygen at this point due to severe COPD.  He does still smoke.  He is supposed to be on Xarelto due to his chronic atrial fibrillation but he stopped this due to nosebleeds.  He is supposed to see cardiology again at some point.  Both knees hurt on a daily basis.  He is not a diabetic.  He also has a history of congestive heart failure.  He is a patient of the community [facility]. ? ?HPI ? ?Review of Systems ?He currently ? ?Objective: ?Vital Signs: There were no vitals taken for this visit. ? ?Physical Exam ?He is alert and orient x3 and in no acute distress ?Ortho Exam ?Examination of both knees shows no effusion today with global tenderness mainly the medial joint line of both knees. ?Specialty Comments:  ?No specialty comments available. ? ?Imaging: ?XR Knee 1-2 Views Left ? ?Result Date: 10/02/2021 ?2 views of the left knee show tricompartment arthritic changes and no acute findings. ? ?XR Knee 1-2 Views  Right ? ?Result Date: 10/02/2021 ?2 views of the right knee shows no acute findings.  There is tricompartmental arthritis with medial joint space narrowing and patellofemoral narrowing as well as osteophytes in all 3 compartments.  ? ? ?PMFS History: ?Patient Active Problem List  ? Diagnosis Date Noted  ? Closed fracture of distal end of fibula, unspecified fracture morphology, initial encounter   ? Pre-syncope   ? Syncope and collapse 02/07/2021  ? Elevated troponin 01/30/2021  ? History of GI bleed   ? Gastric ulcer due to Helicobacter pylori and nonsteroidal anti-inflammatory drug (NSAID)   ? AKI (acute kidney injury) ([place]) 11/18/2020  ? Epigastric pain   ? Acute gastric ulcer with hemorrhage   ? Acute blood loss anemia   ? Acute upper GI bleed 11/12/2020  ? Hematemesis with nausea   ? Acute GI bleeding 06/11/2019  ? Syncope 06/11/2019  ? Chronic right maxillary sinusitis 01/29/2019  ? Chronic respiratory failure with hypoxia and hypercapnia ([place]) 01/01/2019  ? Acute on chronic respiratory failure with hypoxia and hypercapnia ([place]) 11/10/2018  ? GI bleed 10/27/2018  ? Chronic anticoagulation 09/27/2018  ? AV malformation of gastrointestinal tract   ? Occult GI bleeding   ? Heme positive stool   ? Iron deficiency anemia   ? Acute on chronic anemia 09/04/2018  ? Noncompliance 06/14/2017  ? Elevated LFTs 08/13/2016  ? Chronic respiratory failure with hypoxia ([place]) 07/24/2016  ? Atrial flutter ([place])   ? Hepatic congestion 07/02/2016  ? Acute on chronic combined systolic and diastolic CHF (congestive heart failure) ([place]) 07/01/2016  ? COPD with acute exacerbation ([place]) 06/03/2016  ? Prediabetes 05/14/2016  ? Hemoptysis 05/06/2016  ? COPD GOLD III/still smoking  02/29/2016  ? Cigarette smoker 01/09/2016  ? CAD (coronary artery disease) 10/07/2013  ? 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 Centrilobular emphysema ([place]) 10/04/2013  ? Essential hypertension   ? ?Past Medical History:  ?Diagnosis Date  ? AVM  (arteriovenous malformation)   ? CAD (coronary artery disease)   ? a. LHC 5/12:  LAD 20, pLCx 20, pRCA 40, dRCA 40, EF 35%, diff HK  //  b. Myoview 4/16: Overall Impression:  High risk stress nuclear study There is no evidence of ischemia.  There is severe LV dysfunction. LV Ejection Fraction: 30%.  LV Wall Motion:  There is global LV hypokinesis.    ? CAP (community acquired pneumonia) 09/2013  ? Chronic combined systolic and diastolic CHF (congestive heart failure) ([place])   ? a. Echo 4/16:Mild LVH, EF 40-45%, diffuse HK //  b. Echo 8/17: EF 35-40%, diffuse HK, diastolic dysfunction, aortic sclerosis, trivial MR, moderate LAE, normal RVSF, moderate RAE, mild TR, PASP 42 mmHg // c. Echo 4/18: Mild concentric LVH, EF 30-35, normal wall motion, grade 1 diastolic dysfunction, PASP 49  ?  Chronic respiratory failure ([place])   ? Cluster headache   ? "hx; haven't had one in awhile" (01/09/2016)  ? COPD (chronic obstructive pulmonary disease) ([place])   ? [name] 01/09/2016  ? History of CVA (cerebrovascular accident)   ? Hypertension   ? IDA (iron deficiency anemia)   ? Moderate tobacco use disorder   ? NICM (nonischemic cardiomyopathy) ([place])   ? Nicotine addiction   ? Tobacco abuse   ?  ?Family History  ?Problem Relation Age of Onset  ? Heart disease Mother   ? Diabetes Mother   ? Colon cancer Mother   ? Liver cancer Mother   ? Cancer Father   ?     type unknown  ? Diabetes Sister   ?     x 2  ? Diabetes Brother   ?  ?Past Surgical History:  ?Procedure Laterality Date  ? BIOPSY  11/12/2020  ? Procedure: BIOPSY;  Surgeon: Irving Copas., MD;  Location: [facility];  Service: Gastroenterology;;  ? CARDIAC CATHETERIZATION  10/2010  ? LM normal, LAD with 20% irregularities, LCX with 20%, RCA with 40% prox and 40% distal - EF of 35%  ? CATARACT EXTRACTION, BILATERAL    ? COLONOSCOPY W/ BIOPSIES AND POLYPECTOMY    ? COLONOSCOPY WITH PROPOFOL N/A 09/06/2018  ? Procedure: COLONOSCOPY WITH PROPOFOL;  Surgeon: Thornton Park, MD;   Location: [facility];  Service: Gastroenterology;  Laterality: N/A;  ? ENTEROSCOPY N/A 09/28/2018  ? Procedure: ENTEROSCOPY;  Surgeon: Carol Ada, MD;  Location: [facility];  Service: Endoscopy;

## 2021-10-31 ENCOUNTER — Other Ambulatory Visit: Payer: Self-pay

## 2021-11-01 ENCOUNTER — Other Ambulatory Visit: Payer: Self-pay

## 2021-11-08 ENCOUNTER — Other Ambulatory Visit: Payer: Self-pay | Admitting: Family Medicine

## 2021-11-08 DIAGNOSIS — I4892 Unspecified atrial flutter: Secondary | ICD-10-CM

## 2021-11-08 DIAGNOSIS — I110 Hypertensive heart disease with heart failure: Secondary | ICD-10-CM

## 2021-12-06 ENCOUNTER — Other Ambulatory Visit: Payer: Self-pay

## 2021-12-06 ENCOUNTER — Other Ambulatory Visit: Payer: Self-pay | Admitting: Internal Medicine

## 2021-12-06 MED ORDER — BREZTRI AEROSPHERE 160-9-4.8 MCG/ACT IN AERO
INHALATION_SPRAY | RESPIRATORY_TRACT | 6 refills | Status: DC
  Filled 2021-12-06: qty 10.7, 30d supply, fill #0
  Filled 2022-01-04: qty 10.7, 30d supply, fill #1
  Filled 2022-02-02: qty 10.7, 30d supply, fill #2
  Filled 2022-03-22: qty 10.7, 30d supply, fill #3
  Filled 2022-04-10 – 2022-04-26 (×2): qty 10.7, 30d supply, fill #4
  Filled 2022-05-21: qty 10.7, 30d supply, fill #5
  Filled 2022-07-03: qty 10.7, 30d supply, fill #6

## 2021-12-07 ENCOUNTER — Other Ambulatory Visit: Payer: Self-pay

## 2021-12-11 ENCOUNTER — Emergency Department (HOSPITAL_COMMUNITY): Payer: Medicare HMO

## 2021-12-11 ENCOUNTER — Encounter (HOSPITAL_COMMUNITY): Payer: Self-pay

## 2021-12-11 ENCOUNTER — Inpatient Hospital Stay (HOSPITAL_COMMUNITY)
Admission: EM | Admit: 2021-12-11 | Discharge: 2021-12-14 | DRG: 291 | Disposition: A | Discharge status: Home / Self Care | Payer: Medicare HMO | Attending: Internal Medicine | Admitting: Internal Medicine

## 2021-12-11 ENCOUNTER — Other Ambulatory Visit: Payer: Self-pay

## 2021-12-11 DIAGNOSIS — Z79899 Other long term (current) drug therapy: Secondary | ICD-10-CM

## 2021-12-11 DIAGNOSIS — Z8673 Personal history of transient ischemic attack (TIA), and cerebral infarction without residual deficits: Secondary | ICD-10-CM | POA: Diagnosis not present

## 2021-12-11 DIAGNOSIS — Z833 Family history of diabetes mellitus: Secondary | ICD-10-CM

## 2021-12-11 DIAGNOSIS — J441 Chronic obstructive pulmonary disease with (acute) exacerbation: Secondary | ICD-10-CM | POA: Diagnosis not present

## 2021-12-11 DIAGNOSIS — I5043 Acute on chronic combined systolic (congestive) and diastolic (congestive) heart failure: Secondary | ICD-10-CM | POA: Diagnosis not present

## 2021-12-11 DIAGNOSIS — F17210 Nicotine dependence, cigarettes, uncomplicated: Secondary | ICD-10-CM | POA: Diagnosis not present

## 2021-12-11 DIAGNOSIS — J439 Emphysema, unspecified: Secondary | ICD-10-CM | POA: Diagnosis not present

## 2021-12-11 DIAGNOSIS — J9621 Acute and chronic respiratory failure with hypoxia: Secondary | ICD-10-CM | POA: Diagnosis not present

## 2021-12-11 DIAGNOSIS — Z7901 Long term (current) use of anticoagulants: Secondary | ICD-10-CM

## 2021-12-11 DIAGNOSIS — Z9981 Dependence on supplemental oxygen: Secondary | ICD-10-CM

## 2021-12-11 DIAGNOSIS — I255 Ischemic cardiomyopathy: Secondary | ICD-10-CM | POA: Diagnosis present

## 2021-12-11 DIAGNOSIS — Z888 Allergy status to other drugs, medicaments and biological substances status: Secondary | ICD-10-CM | POA: Diagnosis not present

## 2021-12-11 DIAGNOSIS — M549 Dorsalgia, unspecified: Secondary | ICD-10-CM | POA: Diagnosis present

## 2021-12-11 DIAGNOSIS — I480 Paroxysmal atrial fibrillation: Secondary | ICD-10-CM | POA: Diagnosis present

## 2021-12-11 DIAGNOSIS — J9622 Acute and chronic respiratory failure with hypercapnia: Secondary | ICD-10-CM | POA: Diagnosis present

## 2021-12-11 DIAGNOSIS — R9431 Abnormal electrocardiogram [ECG] [EKG]: Secondary | ICD-10-CM | POA: Diagnosis not present

## 2021-12-11 DIAGNOSIS — J811 Chronic pulmonary edema: Secondary | ICD-10-CM | POA: Diagnosis not present

## 2021-12-11 DIAGNOSIS — R109 Unspecified abdominal pain: Secondary | ICD-10-CM | POA: Diagnosis not present

## 2021-12-11 DIAGNOSIS — R0602 Shortness of breath: Secondary | ICD-10-CM | POA: Diagnosis not present

## 2021-12-11 DIAGNOSIS — I110 Hypertensive heart disease with heart failure: Secondary | ICD-10-CM | POA: Diagnosis not present

## 2021-12-11 DIAGNOSIS — I2510 Atherosclerotic heart disease of native coronary artery without angina pectoris: Secondary | ICD-10-CM | POA: Diagnosis present

## 2021-12-11 DIAGNOSIS — G8929 Other chronic pain: Secondary | ICD-10-CM | POA: Diagnosis present

## 2021-12-11 DIAGNOSIS — J449 Chronic obstructive pulmonary disease, unspecified: Secondary | ICD-10-CM | POA: Diagnosis present

## 2021-12-11 DIAGNOSIS — Z8249 Family history of ischemic heart disease and other diseases of the circulatory system: Secondary | ICD-10-CM | POA: Diagnosis not present

## 2021-12-11 DIAGNOSIS — Z743 Need for continuous supervision: Secondary | ICD-10-CM | POA: Diagnosis not present

## 2021-12-11 DIAGNOSIS — I5022 Chronic systolic (congestive) heart failure: Secondary | ICD-10-CM | POA: Diagnosis not present

## 2021-12-11 DIAGNOSIS — R6889 Other general symptoms and signs: Secondary | ICD-10-CM | POA: Diagnosis not present

## 2021-12-11 LAB — I-STAT VENOUS BLOOD GAS, ED
Acid-Base Excess: 5 mmol/L — ABNORMAL HIGH (ref 0.0–2.0)
Bicarbonate: 31.8 mmol/L — ABNORMAL HIGH (ref 20.0–28.0)
Calcium, Ion: 0.94 mmol/L — ABNORMAL LOW (ref 1.15–1.40)
HCT: 43 % (ref 39.0–52.0)
Hemoglobin: 14.6 g/dL (ref 13.0–17.0)
O2 Saturation: 100 %
Potassium: 3.9 mmol/L (ref 3.5–5.1)
Sodium: 145 mmol/L (ref 135–145)
TCO2: 33 mmol/L — ABNORMAL HIGH (ref 22–32)
pCO2, Ven: 52.9 mmHg (ref 44–60)
pH, Ven: 7.386 (ref 7.25–7.43)
pO2, Ven: 174 mmHg — ABNORMAL HIGH (ref 32–45)

## 2021-12-11 LAB — COMPREHENSIVE METABOLIC PANEL
ALT: 15 U/L (ref 0–44)
AST: 31 U/L (ref 15–41)
Albumin: 3.7 g/dL (ref 3.5–5.0)
Alkaline Phosphatase: 47 U/L (ref 38–126)
Anion gap: 6 (ref 5–15)
BUN: 15 mg/dL (ref 8–23)
CO2: 32 mmol/L (ref 22–32)
Calcium: 8.5 mg/dL — ABNORMAL LOW (ref 8.9–10.3)
Chloride: 104 mmol/L (ref 98–111)
Creatinine, Ser: 1.06 mg/dL (ref 0.61–1.24)
GFR, Estimated: >60 mL/min (ref >60)
Glucose, Bld: 100 mg/dL — ABNORMAL HIGH (ref 70–99)
Potassium: 5 mmol/L (ref 3.5–5.1)
Sodium: 142 mmol/L (ref 135–145)
Total Bilirubin: 0.6 mg/dL (ref 0.3–1.2)
Total Protein: 5.9 g/dL — ABNORMAL LOW (ref 6.5–8.1)

## 2021-12-11 LAB — CBC WITH DIFFERENTIAL/PLATELET
Abs Immature Granulocytes: 0.02 10*3/uL (ref 0.00–0.07)
Basophils Absolute: 0 10*3/uL (ref 0.0–0.1)
Basophils Relative: 0 %
Eosinophils Absolute: 0.1 10*3/uL (ref 0.0–0.5)
Eosinophils Relative: 2 %
HCT: 51.9 % (ref 39.0–52.0)
Hemoglobin: 15.1 g/dL (ref 13.0–17.0)
Immature Granulocytes: 0 %
Lymphocytes Relative: 37 %
Lymphs Abs: 2.6 10*3/uL (ref 0.7–4.0)
MCH: 26.9 pg (ref 26.0–34.0)
MCHC: 29.1 g/dL — ABNORMAL LOW (ref 30.0–36.0)
MCV: 92.5 fL (ref 80.0–100.0)
Monocytes Absolute: 0.6 10*3/uL (ref 0.1–1.0)
Monocytes Relative: 9 %
Neutro Abs: 3.5 10*3/uL (ref 1.7–7.7)
Neutrophils Relative %: 52 %
Platelets: 176 10*3/uL (ref 150–400)
RBC: 5.61 MIL/uL (ref 4.22–5.81)
RDW: 14.4 % (ref 11.5–15.5)
WBC: 6.9 10*3/uL (ref 4.0–10.5)
nRBC: 0 % (ref 0.0–0.2)

## 2021-12-11 LAB — TROPONIN I (HIGH SENSITIVITY)
Troponin I (High Sensitivity): 34 ng/L — ABNORMAL HIGH (ref <18)
Troponin I (High Sensitivity): 29 ng/L — ABNORMAL HIGH (ref <18)

## 2021-12-11 LAB — BRAIN NATRIURETIC PEPTIDE: B Natriuretic Peptide: 447.3 pg/mL — ABNORMAL HIGH (ref 0.0–100.0)

## 2021-12-11 MED ORDER — FLUTICASONE PROPIONATE 50 MCG/ACT NA SUSP: 1.0000 | Freq: Every day | NASAL | Status: DC

## 2021-12-11 MED ORDER — PREDNISONE 20 MG PO TABS: 40.0000 mg | ORAL_TABLET | Freq: Every day | ORAL | Status: DC

## 2021-12-11 MED ORDER — MAGNESIUM SULFATE 2 GM/50ML IV SOLN
2.0000 g | Freq: Once | INTRAVENOUS | Status: AC
  Administered 2021-12-11: 2 g via INTRAVENOUS
  Filled 2021-12-11: qty 50

## 2021-12-11 MED ORDER — GABAPENTIN 300 MG PO CAPS
300.0000 mg | ORAL_CAPSULE | Freq: Every day | ORAL | Status: DC
  Administered 2021-12-11 – 2021-12-13 (×3): 300 mg via ORAL
  Filled 2021-12-11 (×3): qty 1

## 2021-12-11 MED ORDER — ALBUTEROL SULFATE (2.5 MG/3ML) 0.083% IN NEBU
10.0000 mg/h | INHALATION_SOLUTION | RESPIRATORY_TRACT | Status: DC
  Administered 2021-12-11: 10 mg/h via RESPIRATORY_TRACT
  Filled 2021-12-11 (×14): qty 12

## 2021-12-11 MED ORDER — GUAIFENESIN ER 600 MG PO TB12
1200.0000 mg | ORAL_TABLET | Freq: Two times a day (BID) | ORAL | Status: DC
  Administered 2021-12-11 – 2021-12-14 (×7): 1200 mg via ORAL
  Filled 2021-12-11 (×7): qty 2

## 2021-12-11 MED ORDER — ATORVASTATIN CALCIUM 80 MG PO TABS
80.0000 mg | ORAL_TABLET | Freq: Every day | ORAL | Status: DC
  Administered 2021-12-11 – 2021-12-14 (×4): 80 mg via ORAL
  Filled 2021-12-11: qty 2
  Filled 2021-12-11 (×3): qty 1

## 2021-12-11 MED ORDER — NICOTINE 7 MG/24HR TD PT24
7.0000 mg | MEDICATED_PATCH | Freq: Every day | TRANSDERMAL | Status: DC
  Administered 2021-12-11 – 2021-12-14 (×4): 7 mg via TRANSDERMAL
  Filled 2021-12-11 (×6): qty 1

## 2021-12-11 MED ORDER — CARVEDILOL 3.125 MG PO TABS
3.1250 mg | ORAL_TABLET | Freq: Two times a day (BID) | ORAL | Status: DC
  Administered 2021-12-11 – 2021-12-14 (×6): 3.125 mg via ORAL
  Filled 2021-12-11 (×6): qty 1

## 2021-12-11 MED ORDER — FUROSEMIDE 40 MG PO TABS
40.0000 mg | ORAL_TABLET | Freq: Every day | ORAL | Status: DC
Start: 2021-12-11 — End: 2021-12-12
  Administered 2021-12-11 – 2021-12-12 (×2): 40 mg via ORAL
  Filled 2021-12-11: qty 2
  Filled 2021-12-11: qty 1

## 2021-12-11 MED ORDER — ALBUTEROL SULFATE (2.5 MG/3ML) 0.083% IN NEBU
2.5000 mg | INHALATION_SOLUTION | RESPIRATORY_TRACT | Status: DC
Start: 2021-12-11 — End: 2021-12-12
  Administered 2021-12-11 (×2): 2.5 mg via RESPIRATORY_TRACT
  Filled 2021-12-11 (×2): qty 3

## 2021-12-11 MED ORDER — AZITHROMYCIN 500 MG PO TABS
500.0000 mg | ORAL_TABLET | Freq: Every day | ORAL | Status: DC
  Administered 2021-12-12 – 2021-12-14 (×3): 500 mg via ORAL
  Filled 2021-12-11 (×3): qty 1

## 2021-12-11 MED ORDER — MOMETASONE FURO-FORMOTEROL FUM 200-5 MCG/ACT IN AERO
2.0000 | INHALATION_SPRAY | Freq: Two times a day (BID) | RESPIRATORY_TRACT | Status: DC
  Administered 2021-12-12 – 2021-12-14 (×4): 2 via RESPIRATORY_TRACT
  Filled 2021-12-11 (×2): qty 8.8

## 2021-12-11 MED ORDER — FERROUS SULFATE 325 (65 FE) MG PO TABS
325.0000 mg | ORAL_TABLET | Freq: Two times a day (BID) | ORAL | Status: DC
  Administered 2021-12-11 – 2021-12-14 (×3): 325 mg via ORAL
  Filled 2021-12-11 (×4): qty 1

## 2021-12-11 MED ORDER — METHYLPREDNISOLONE SODIUM SUCC 40 MG IJ SOLR
40.0000 mg | Freq: Two times a day (BID) | INTRAMUSCULAR | Status: AC
  Administered 2021-12-11 – 2021-12-12 (×2): 40 mg via INTRAVENOUS
  Filled 2021-12-11 (×2): qty 1

## 2021-12-11 MED ORDER — ACETAMINOPHEN 325 MG PO TABS
650.0000 mg | ORAL_TABLET | Freq: Once | ORAL | Status: AC
Start: 2021-12-11 — End: 2021-12-11
  Administered 2021-12-11: 650 mg via ORAL
  Filled 2021-12-11: qty 2

## 2021-12-11 MED ORDER — ALBUTEROL SULFATE (2.5 MG/3ML) 0.083% IN NEBU: 2.5000 mg | INHALATION_SOLUTION | Freq: Four times a day (QID) | RESPIRATORY_TRACT | Status: DC | PRN

## 2021-12-11 MED ORDER — ALBUTEROL (5 MG/ML) CONTINUOUS INHALATION SOLN: 10.0000 mg/h | INHALATION_SOLUTION | Freq: Once | RESPIRATORY_TRACT | Status: DC

## 2021-12-11 MED ORDER — TRAMADOL HCL 50 MG PO TABS
50.0000 mg | ORAL_TABLET | Freq: Four times a day (QID) | ORAL | Status: DC | PRN
  Administered 2021-12-12 – 2021-12-13 (×4): 50 mg via ORAL
  Filled 2021-12-11 (×4): qty 1

## 2021-12-11 MED ORDER — SODIUM CHLORIDE 0.9 % IV SOLN
500.0000 mg | INTRAVENOUS | Status: AC
  Administered 2021-12-11: 500 mg via INTRAVENOUS
  Filled 2021-12-11: qty 5

## 2021-12-11 MED ORDER — RIVAROXABAN 20 MG PO TABS
20.0000 mg | ORAL_TABLET | Freq: Every day | ORAL | Status: DC
  Administered 2021-12-11 – 2021-12-13 (×3): 20 mg via ORAL
  Filled 2021-12-11: qty 1
  Filled 2021-12-11: qty 2
  Filled 2021-12-11: qty 1

## 2021-12-11 MED ORDER — IPRATROPIUM-ALBUTEROL 0.5-2.5 (3) MG/3ML IN SOLN
3.0000 mL | Freq: Once | RESPIRATORY_TRACT | Status: AC
  Administered 2021-12-11: 3 mL via RESPIRATORY_TRACT
  Filled 2021-12-11: qty 3

## 2021-12-11 NOTE — ED Provider Notes (Addendum)
[facility] EMERGENCY DEPARTMENT Provider Note   CSN: [id number] Arrival date & time: 12/11/21  0732     History Chief Complaint  Patient presents with   Shortness of Breath    Jared [name] is a 66 y.o. male with h/o COPD, CAD, CHF, HTN, NICM, tobacco user presents to the ED for evaluation of SOB gradually worsening since 1300 yesterday. The patient reports that he tried a nebulizer treatment and was able to sleep through the night, but was still feeling SOB. He has 2L [place] as needed. The patient reports he has found minimal relief with that. He reports a cough, sometimes productive. Denies any chest pain, fever, chills, nausea, vomiting, or lightheadedness. EMS gave 125mg  of solumedrol and a duoneb in route to the ED.    Shortness of Breath Associated symptoms: cough   Associated symptoms: no abdominal pain, no chest pain, no fever and no vomiting        Home Medications Prior to Admission medications   Medication Sig Start Date End Date Taking? Authorizing Provider  albuterol (VENTOLIN HFA) 108 (90 Base) MCG/ACT inhaler INHALE 2 PUFFS INTO THE LUNGS EVERY 6 (SIX) HOURS AS NEEDED FOR WHEEZING OR SHORTNESS OF BREATH. 09/20/21 09/20/22  11/20/22, MD  albuterol (PROVENTIL) (2.5 MG/3ML) 0.083% nebulizer solution Take 3 mLs (2.5 mg total) by nebulization every 4 (four) hours as needed for wheezing or shortness of breath. 11/01/20   11/03/20, DO  atorvastatin (LIPITOR) 80 MG tablet Take 1 tablet (80 mg total) by mouth daily. 11/08/21   11/10/21, MD  Budeson-Glycopyrrol-Formoterol (BREZTRI AEROSPHERE) 160-9-4.8 MCG/ACT AERO Take 2 puffs first thing in morning and then another 2 puffs about 12 hours later. 12/06/21   12/08/21, MD  ferrous sulfate 325 (65 FE) MG tablet Take 1 tablet (325 mg total) by mouth 2 (two) times daily with a meal. 11/16/20   01/16/21 A, DO  fluticasone (FLONASE) 50 MCG/ACT nasal spray Place 1 spray into both nostrils daily.  Patient will need to make a follow up appointment for further refills. 06/19/21   08/17/21, MD  furosemide (LASIX) 40 MG tablet TAKE 1 TABLET EVERY DAY 11/08/21   11/10/21, MD  gabapentin (NEURONTIN) 300 MG capsule TAKE 1 CAPSULE (300 MG TOTAL) BY MOUTH AT BEDTIME. 06/19/21 06/19/22  08/18/22, MD  Multiple Vitamin (MULTIVITAMIN WITH MINERALS) TABS tablet Take 1 tablet by mouth daily. 11/16/20   01/16/21, DO  nitroGLYCERIN (NITROSTAT) 0.4 MG SL tablet Place 1 tablet (0.4 mg total) under the tongue every 5 (five) minutes as needed for chest pain. 11/19/20 11/19/21  01/19/22, MD  traMADol (ULTRAM) 50 MG tablet Take 1 tablet (50 mg total) by mouth every 6 (six) hours as needed for moderate pain. 02/11/21   04/13/21, MD  triamcinolone cream (KENALOG) 0.1 % Apply 1 application. topically 2 (two) times daily. 09/20/21   McClung, 11/20/21, PA-C  XARELTO 20 MG TABS tablet TAKE 1 TABLET EVERY DAY WITH SUPPER (MUST HAVE OFFICE VISIT FOR REFILLS) 11/08/21   11/10/21, MD  TUDORZA PRESSAIR 400 MCG/ACT AEPB INHALE 1 PUFF INTO THE LUNGS IN THE MORNING AND AT BEDTIME. 03/29/20 04/04/20  04/06/20, MD      Allergies    Lisinopril    Review of Systems   Review of Systems  Constitutional:  Negative for chills and fever.  HENT:  Negative for congestion and rhinorrhea.   Respiratory:  Positive for cough and  shortness of breath.   Cardiovascular:  Negative for chest pain and palpitations.  Gastrointestinal:  Negative for abdominal pain, nausea and vomiting.  Genitourinary:  Negative for dysuria and hematuria.    Physical Exam Updated Vital Signs BP (!) 142/81   Pulse 86   Temp 98.6 F (37 C) (Oral)   Resp 18   Ht 6\' 3"  (1.905 m)   Wt 103.6 kg   SpO2 92%   BMI 28.55 kg/m  Physical Exam Vitals and nursing note reviewed.  Constitutional:      Appearance: Normal appearance.  HENT:     Head: Normocephalic and atraumatic.  Eyes:     General: No scleral  icterus. Cardiovascular:     Rate and Rhythm: Normal rate.  Pulmonary:     Comments: Diminished breath sounds diffusely although he does have some expiratory wheezing.  Prolonged expiratory phase as well.  Slight increase in work of breathing although he is speaking in full sentences.  He is needed to take a breath in between sentences.  No tripoding, nasal flaring, or cyanosis present. Abdominal:     General: Abdomen is flat. Bowel sounds are normal.     Palpations: Abdomen is soft.  Musculoskeletal:        General: No deformity.     Cervical back: Normal range of motion.  Skin:    General: Skin is warm and dry.  Neurological:     General: No focal deficit present.     Mental Status: He is alert. Mental status is at baseline.     ED Results / Procedures / Treatments   Labs (all labs ordered are listed, but only abnormal results are displayed) Labs Reviewed  CBC WITH DIFFERENTIAL/PLATELET - Abnormal; Notable for the following components:      Result Value   MCHC 29.1 (*)    All other components within normal limits  COMPREHENSIVE METABOLIC PANEL - Abnormal; Notable for the following components:   Glucose, Bld 100 (*)    Calcium 8.5 (*)    Total Protein 5.9 (*)    All other components within normal limits  BRAIN NATRIURETIC PEPTIDE - Abnormal; Notable for the following components:   B Natriuretic Peptide 447.3 (*)    All other components within normal limits  I-STAT VENOUS BLOOD GAS, ED - Abnormal; Notable for the following components:   pO2, Ven 174 (*)    Bicarbonate 31.8 (*)    TCO2 33 (*)    Acid-Base Excess 5.0 (*)    Calcium, Ion 0.94 (*)    All other components within normal limits  TROPONIN I (HIGH SENSITIVITY) - Abnormal; Notable for the following components:   Troponin I (High Sensitivity) 34 (*)    All other components within normal limits  TROPONIN I (HIGH SENSITIVITY)    EKG EKG Interpretation  Date/Time:  Monday December 11 2021 07:34:41 EDT Ventricular  Rate:  93 PR Interval:  176 QRS Duration: 99 QT Interval:  390 QTC Calculation: 486 R Axis:   256 Text Interpretation: Sinus rhythm Atrial premature complexes Inferior infarct, old no stemi similar to prior tracing Confirmed by Wynona Dove ([id number]) on 12/11/2021 9:53:51 AM  Radiology DG Chest 2 View  Result Date: 12/11/2021 CLINICAL DATA:  66 year old male with shortness of breath since yesterday. EXAM: CHEST - 2 VIEW COMPARISON:  Chest CT 07/12/2021 and earlier. FINDINGS: AP and lateral views at 0758 hours. Cardiac size seems increased since February. Mild cardiomegaly now. Other mediastinal contours are within normal limits. Visualized tracheal air  column is within normal limits. Centrilobular emphysema depicted by CT this year. Pulmonary vascularity appears stable from radiographs last year. No acute pulmonary edema. No pleural effusion or consolidation. No acute osseous abnormality identified. Negative visible bowel gas. IMPRESSION: Emphysema (ICD10-J43.9), and questionable cardiomegaly. No acute cardiopulmonary abnormality. Electronically Signed   By: Odessa Fleming M.D.   On: 12/11/2021 08:42    Procedures Procedures   Medications Ordered in ED Medications  albuterol (PROVENTIL) (2.5 MG/3ML) 0.083% nebulizer solution (10 mg/hr Nebulization New Bag/Given 12/11/21 1010)  nicotine (NICODERM CQ - dosed in mg/24 hr) patch 7 mg (has no administration in time range)  magnesium sulfate IVPB 2 g 50 mL (0 g Intravenous Stopped 12/11/21 0914)  ipratropium-albuterol (DUONEB) 0.5-2.5 (3) MG/3ML nebulizer solution 3 mL (3 mLs Nebulization Given 12/11/21 0819)  acetaminophen (TYLENOL) tablet 650 mg (650 mg Oral Given 12/11/21 1003)    ED Course/ Medical Decision Making/ A&P                           Medical Decision Making Amount and/or Complexity of Data Reviewed Labs: ordered. Radiology: ordered.  Risk OTC drugs. Prescription drug management. Decision regarding hospitalization.   66 year old male  presents to the emergency department for evaluation of shortness of breath gradually worsening since yesterday.  Differential diagnosis includes but is not limited to COPD, pneumonia, emphysema, pneumothorax, ACS, viral illness, PE.  Vital signs show mildly elevated blood pressure 142/81 otherwise normal.  Patient is on his as needed oxygen and is satting between 92 and 98%.  Physical exam is pertinent for some diminished breath sounds diffusely although he does have some expiratory wheezing.  Prolonged expiratory phase as well.  Slight increase in work of breathing although he is speaking in full sentences.  He is needed to take a breath in between sentences.  No tripoding, nasal flaring, or cyanosis present.  Regular rate and rhythm.  Minimal edema to bilateral lower extremities.  Patient was given Solu-Medrol and DuoNeb by EMS.  I gave him another DuoNeb since being here.  Also ordered him IV magnesium sulfate as well.  Labs and chest x-ray ordered.  I independently reviewed and interpreted the patient's labs.  CMP shows mildly elevated glucose at 100 although not fasting.  Mild decrease in calcium and total protein otherwise no electrolyte or LFT abnormality.  CBC without leukocytosis or anemia.  BNP elevated at 447.3 although appears to be the lowest its been a year.  I-STAT VBG shows normal pH in the venous.  Normal PCO2.  Elevated partial oxygen rate although patient is on oxygen.  Patient has a troponin at 34, repeat at 29, delta-5.  Patient has an elevated troponin at baseline.  Chest x-ray shows questionable cardiomegaly otherwise no acute cardiopulmonary process.  Emphysema present.  EKG reviewed and interpreted by my attending.Sinus rhythm Atrial premature complexes Inferior infarct, old no stemi similar to prior tracing.  On reevaluation, patient reports he is feeling somewhat better after the DuoNeb although he is still having very mild increased work of breathing and is still having  diminished breath sounds throughout with some expiratory wheezing.  Respiratory therapist, Kirt Boys, assessed at bedside and agrees with the plan of 1 hour continuous albuterol nebulizer.  On reevaluation after the hour continuous albuterol nebulizer, patient is still having mild increased work of breathing as well as some wheezing.  He is satting 92% on his 2 L.  Overall, the patient has received Solu-Medrol, magnesium, 2 DuoNebs,  continuous nebulizer and is still having trouble breathing and wheezing.  I discussed with patient possible need for admission and he is agreeable to this.  Admitting to Dr. Roosevelt Locks for COPD exacerbation.   Final Clinical Impression(s) / ED Diagnoses Final diagnoses:  COPD exacerbation [place])    Rx / DC Orders ED Discharge Orders     None         Sherrell Puller, PA-C 12/11/21 [place], [place], DO 12/13/21 0147    Sherrell Puller, PA-C 01/04/22 1003    Jeanell Sparrow, DO 01/06/22 [phone]

## 2021-12-11 NOTE — ED Notes (Signed)
Called floor reference no purple man at this time

## 2021-12-11 NOTE — ED Notes (Signed)
Called floor and requested purple man at this time

## 2021-12-11 NOTE — H&P (Signed)
History and Physical    Jared [name] GMW:[id number] DOB: 02-23-1956 DOA: 12/11/2021  PCP: [name], MD (Confirm with patient/family/NH records and if not entered, this has to be entered at [facility] point of entry) Patient coming from: Home  I have personally briefly reviewed patient's old medical records in [facility] Link  Chief Complaint: Cough, SOB  HPI: Jared [name] is a 66 y.o. male with medical history significant of COPD Gold stage III, chronic hypoxic respite failure on home O2 2 L as needed, CAD, chronic systolic CHF LVEF 45-50% secondary to ischemic cardiomyopathy, PAF on Xarelto, cigarette smoker, chronic back pain, presented with increasing cough shortness of breath.  Symptoms started 4 to 5 days ago, gradually getting worse, including productive cough with whitish phlegm, cough wheezing shortness of breath.  Is been using nebulizer and home O2 around-the-clock with minimum help.  Started to develop subjective fever overnight.  ED Course: Tachycardia no hypotension, O2 saturation 87% on room air.  Stabilized on 4 L, chest x-ray clear for infiltrates.  WBC 6.9.  ABG 7.z3/52/174.  Review of Systems: As per HPI otherwise 14 point review of systems negative.    Past Medical History:  Diagnosis Date   AVM (arteriovenous malformation)    CAD (coronary artery disease)    a. LHC 5/12:  LAD 20, pLCx 20, pRCA 40, dRCA 40, EF 35%, diff HK  //  b. Myoview 4/16: Overall Impression:  High risk stress nuclear study There is no evidence of ischemia.  There is severe LV dysfunction. LV Ejection Fraction: 30%.  LV Wall Motion:  There is global LV hypokinesis.     CAP (community acquired pneumonia) 09/2013   Chronic combined systolic and diastolic CHF (congestive heart failure) (HCC)    a. Echo 4/16:Mild LVH, EF 40-45%, diffuse HK //  b. Echo 8/17: EF 35-40%, diffuse HK, diastolic dysfunction, aortic sclerosis, trivial MR, moderate LAE, normal RVSF, moderate RAE, mild TR, PASP 42 mmHg // c. Echo  4/18: Mild concentric LVH, EF 30-35, normal wall motion, grade 1 diastolic dysfunction, PASP 49   Chronic respiratory failure (HCC)    Cluster headache    "hx; haven't had one in awhile" (01/09/2016)   COPD (chronic obstructive pulmonary disease) (HCC)    [name] 01/09/2016   History of CVA (cerebrovascular accident)    Hypertension    IDA (iron deficiency anemia)    Moderate tobacco use disorder    NICM (nonischemic cardiomyopathy) (HCC)    Nicotine addiction    Tobacco abuse     Past Surgical History:  Procedure Laterality Date   BIOPSY  11/12/2020   Procedure: BIOPSY;  Surgeon: Lemar Lofty., MD;  Location: [facility] ENDOSCOPY;  Service: Gastroenterology;;   CARDIAC CATHETERIZATION  10/2010   LM normal, LAD with 20% irregularities, LCX with 20%, RCA with 40% prox and 40% distal - EF of 35%   CATARACT EXTRACTION, BILATERAL     COLONOSCOPY W/ BIOPSIES AND POLYPECTOMY     COLONOSCOPY WITH PROPOFOL N/A 09/06/2018   Procedure: COLONOSCOPY WITH PROPOFOL;  Surgeon: Tressia Danas, MD;  Location: [facility] ENDOSCOPY;  Service: Gastroenterology;  Laterality: N/A;   ENTEROSCOPY N/A 09/28/2018   Procedure: ENTEROSCOPY;  Surgeon: Jeani Hawking, MD;  Location: [facility] ENDOSCOPY;  Service: Endoscopy;  Laterality: N/A;   ENTEROSCOPY N/A 10/28/2018   Procedure: ENTEROSCOPY;  Surgeon: Tressia Danas, MD;  Location: [facility] ENDOSCOPY;  Service: Gastroenterology;  Laterality: N/A;   ENTEROSCOPY N/A 10/09/2020   Procedure: ENTEROSCOPY;  Surgeon: Hilarie Fredrickson, MD;  Location: [facility]  ENDOSCOPY;  Service: Endoscopy;  Laterality: N/A;   ESOPHAGOGASTRODUODENOSCOPY N/A 11/12/2020   Procedure: ESOPHAGOGASTRODUODENOSCOPY (EGD);  Surgeon: Irving Copas., MD;  Location: [facility] END[facility];  Service: Gastroenterology;  Laterality: N/A;   ESOPHAGOGASTRODUODENOSCOPY (EGD) WITH PROPOFOL N/A 09/05/2018   Procedure: ESOPHAGOGASTRODUODENOSCOPY (EGD) WITH PROPOFOL;  Surgeon: Yetta Flock, MD;  Location: [place];  Service:  Gastroenterology;  Laterality: N/A;   ESOPHAGOGASTRODUODENOSCOPY (EGD) WITH PROPOFOL N/A 11/19/2020   Procedure: ESOPHAGOGASTRODUODENOSCOPY (EGD) WITH PROPOFOL;  Surgeon: Jerene Bears, MD;  Location: [facility] ENDOSCOPY;  Service: Gastroenterology;  Laterality: N/A;   EXCISION MASS HEAD     GIVENS CAPSULE STUDY N/A 09/06/2018   Procedure: GIVENS CAPSULE STUDY;  Surgeon: Thornton Park, MD;  Location: [place];  Service: Gastroenterology;  Laterality: N/A;   GIVENS CAPSULE STUDY N/A 09/26/2018   Procedure: GIVENS CAPSULE STUDY;  Surgeon: Jerene Bears, MD;  Location: [place];  Service: Gastroenterology;  Laterality: N/A;   GIVENS CAPSULE STUDY N/A 06/14/2019   Procedure: GIVENS CAPSULE STUDY;  Surgeon: Mauri Pole, MD;  Location: [place] ENDOSCOPY;  Service: Endoscopy;  Laterality: N/A;   HEMOSTASIS CLIP PLACEMENT  11/12/2020   Procedure: HEMOSTASIS CLIP PLACEMENT;  Surgeon: Irving Copas., MD;  Location: [facility];  Service: Gastroenterology;;   HEMOSTASIS CONTROL  11/12/2020   Procedure: HEMOSTASIS CONTROL;  Surgeon: Irving Copas., MD;  Location: [facility] ENDOSCOPY;  Service: Gastroenterology;;   HOT HEMOSTASIS N/A 10/28/2018   Procedure: HOT HEMOSTASIS (ARGON PLASMA COAGULATION/BICAP);  Surgeon: Thornton Park, MD;  Location: [place];  Service: Gastroenterology;  Laterality: N/A;   HOT HEMOSTASIS N/A 11/12/2020   Procedure: HOT HEMOSTASIS (ARGON PLASMA COAGULATION/BICAP);  Surgeon: Irving Copas., MD;  Location: [facility] ENDOSCOPY;  Service: Gastroenterology;  Laterality: N/A;   INCISION AND DRAINAGE PERIRECTAL ABSCESS N/A 06/05/2017   Procedure: IRRIGATION AND DEBRIDEMENT PERIRECTAL ABSCESS;  Surgeon: Ileana Roup, MD;  Location: [place];  Service: General;  Laterality: N/A;   SUBMUCOSAL TATTOO INJECTION  11/12/2020   Procedure: SUBMUCOSAL TATTOO INJECTION;  Surgeon: Irving Copas., MD;  Location: [facility]OS[facility];  Service: Gastroenterology;;   VIDEO  BRONCHOSCOPY Bilateral 05/08/2016   Procedure: VIDEO BRONCHOSCOPY WITH FLUORO;  Surgeon: Rigoberto Noel, MD;  Location: [place];  Service: Cardiopulmonary;  Laterality: Bilateral;     reports that he has been smoking cigarettes. He has a 23.50 pack-year smoking history. He has never used smokeless tobacco. He reports that he does not currently use alcohol. He reports that he does not use drugs.  Allergies  Allergen Reactions   Lisinopril Cough    Family History  Problem Relation Age of Onset   Heart disease Mother    Diabetes Mother    Colon cancer Mother    Liver cancer Mother    Cancer Father        type unknown   Diabetes Sister        x 2   Diabetes Brother      Prior to Admission medications   Medication Sig Start Date End Date Taking? Authorizing Provider  albuterol (VENTOLIN HFA) 108 (90 Base) MCG/ACT inhaler INHALE 2 PUFFS INTO THE LUNGS EVERY 6 (SIX) HOURS AS NEEDED FOR WHEEZING OR SHORTNESS OF BREATH. 09/20/21 09/20/22  Charlott Rakes, MD  albuterol (PROVENTIL) (2.5 MG/3ML) 0.083% nebulizer solution Take 3 mLs (2.5 mg total) by nebulization every 4 (four) hours as needed for wheezing or shortness of breath. 11/01/20   Patriciaann Clan, DO  atorvastatin (LIPITOR) 80 MG tablet Take 1 tablet (80 mg  total) by mouth daily. 11/08/21   [name], MD  Budeson-Glycopyrrol-Formoterol (BREZTRI AEROSPHERE) 160-9-4.8 MCG/ACT AERO Take 2 puffs first thing in morning and then another 2 puffs about 12 hours later. 12/06/21   Nyoka Cowden, MD  ferrous sulfate 325 (65 FE) MG tablet Take 1 tablet (325 mg total) by mouth 2 (two) times daily with a meal. 11/16/20   Esaw Grandchild A, DO  fluticasone (FLONASE) 50 MCG/ACT nasal spray Place 1 spray into both nostrils daily. Patient will need to make a follow up appointment for further refills. 06/19/21   [name], MD  furosemide (LASIX) 40 MG tablet TAKE 1 TABLET EVERY DAY 11/08/21   [name], MD  gabapentin (NEURONTIN) 300 MG  capsule TAKE 1 CAPSULE (300 MG TOTAL) BY MOUTH AT BEDTIME. 06/19/21 06/19/22  [name], MD  Multiple Vitamin (MULTIVITAMIN WITH MINERALS) TABS tablet Take 1 tablet by mouth daily. 11/16/20   Pennie Banter, DO  nitroGLYCERIN (NITROSTAT) 0.4 MG SL tablet Place 1 tablet (0.4 mg total) under the tongue every 5 (five) minutes as needed for chest pain. 11/19/20 11/19/21  Albertine Grates, MD  traMADol (ULTRAM) 50 MG tablet Take 1 tablet (50 mg total) by mouth every 6 (six) hours as needed for moderate pain. 02/11/21   Zannie Cove, MD  triamcinolone cream (KENALOG) 0.1 % Apply 1 application. topically 2 (two) times daily. 09/20/21   McClung, Marzella Schlein, PA-C  XARELTO 20 MG TABS tablet TAKE 1 TABLET EVERY DAY WITH SUPPER (MUST HAVE OFFICE VISIT FOR REFILLS) 11/08/21   [name], MD  TUDORZA PRESSAIR 400 MCG/ACT AEPB INHALE 1 PUFF INTO THE LUNGS IN THE MORNING AND AT BEDTIME. 03/29/20 04/04/20  Nyoka Cowden, MD    Physical Exam: Vitals:   12/11/21 1100 12/11/21 1130 12/11/21 1230 12/11/21 1239  BP: (!) 148/85 (!) 145/85 138/84   Pulse: 98 98 (!) 101 94  Resp: 16 15 19 16   Temp:      TempSrc:      SpO2: 97% 96% (!) 87% 92%  Weight:      Height:        Constitutional: NAD, calm, comfortable Vitals:   12/11/21 1100 12/11/21 1130 12/11/21 1230 12/11/21 1239  BP: (!) 148/85 (!) 145/85 138/84   Pulse: 98 98 (!) 101 94  Resp: 16 15 19 16   Temp:      TempSrc:      SpO2: 97% 96% (!) 87% 92%  Weight:      Height:       Eyes: PERRL, lids and conjunctivae normal ENMT: Mucous membranes are moist. Posterior pharynx clear of any exudate or lesions.Normal dentition.  Neck: normal, supple, no masses, no thyromegaly Respiratory: Diminished breath sounds bilaterally, scattered wheezing, no crackles, increasing breathing effort, talking in broken sentences.  No accessory muscle use.  Cardiovascular: Regular rate and rhythm, no murmurs / rubs / gallops. No extremity edema. 2+ pedal pulses. No carotid  bruits.  Abdomen: no tenderness, no masses palpated. No hepatosplenomegaly. Bowel sounds positive.  Musculoskeletal: no clubbing / cyanosis. No joint deformity upper and lower extremities. Good ROM, no contractures. Normal muscle tone.  Skin: no rashes, lesions, ulcers. No induration Neurologic: CN 2-12 grossly intact. Sensation intact, DTR normal. Strength 5/5 in all 4.  Psychiatric: Normal judgment and insight. Alert and oriented x 3. Normal mood.     Labs on Admission: I have personally reviewed following labs and imaging studies  CBC: Recent Labs  Lab 12/11/21 0750 12/11/21 0903  WBC  6.9  --   NEUTROABS 3.5  --   HGB 15.1 14.6  HCT 51.9 43.0  MCV 92.5  --   PLT 176  --    Basic Metabolic Panel: Recent Labs  Lab 12/11/21 0750 12/11/21 0903  NA 142 145  K 5.0 3.9  CL 104  --   CO2 32  --   GLUCOSE 100*  --   BUN 15  --   CREATININE 1.06  --   CALCIUM 8.5*  --    GFR: Estimated Creatinine Clearance: 86.9 mL/min (by C-G formula based on SCr of 1.06 mg/dL). Liver Function Tests: Recent Labs  Lab 12/11/21 0750  AST 31  ALT 15  ALKPHOS 47  BILITOT 0.6  PROT 5.9*  ALBUMIN 3.7   No results for input(s): "LIPASE", "AMYLASE" in the last 168 hours. No results for input(s): "AMMONIA" in the last 168 hours. Coagulation Profile: No results for input(s): "INR", "PROTIME" in the last 168 hours. Cardiac Enzymes: No results for input(s): "CKTOTAL", "CKMB", "CKMBINDEX", "TROPONINI" in the last 168 hours. BNP (last 3 results) No results for input(s): "PROBNP" in the last 8760 hours. HbA1C: No results for input(s): "HGBA1C" in the last 72 hours. CBG: No results for input(s): "GLUCAP" in the last 168 hours. Lipid Profile: No results for input(s): "CHOL", "HDL", "LDLCALC", "TRIG", "CHOLHDL", "LDLDIRECT" in the last 72 hours. Thyroid Function Tests: No results for input(s): "TSH", "T4TOTAL", "FREET4", "T3FREE", "THYROIDAB" in the last 72 hours. Anemia Panel: No results  for input(s): "VITAMINB12", "FOLATE", "FERRITIN", "TIBC", "IRON", "RETICCTPCT" in the last 72 hours. Urine analysis:    Component Value Date/Time   COLORURINE YELLOW 01/30/2021 1150   APPEARANCEUR CLEAR 01/30/2021 1150   LABSPEC 1.008 01/30/2021 1150   PHURINE 6.0 01/30/2021 1150   GLUCOSEU NEGATIVE 01/30/2021 1150   HGBUR NEGATIVE 01/30/2021 1150   BILIRUBINUR negative 09/20/2021 1620   KETONESUR negative 09/20/2021 1620   KETONESUR NEGATIVE 01/30/2021 1150   PROTEINUR NEGATIVE 01/30/2021 1150   UROBILINOGEN 0.2 09/20/2021 1620   NITRITE Negative 09/20/2021 1620   NITRITE NEGATIVE 01/30/2021 1150   LEUKOCYTESUR Negative 09/20/2021 1620   LEUKOCYTESUR NEGATIVE 01/30/2021 1150    Radiological Exams on Admission: DG Chest 2 View  Result Date: 12/11/2021 CLINICAL DATA:  66 year old male with shortness of breath since yesterday. EXAM: CHEST - 2 VIEW COMPARISON:  Chest CT 07/12/2021 and earlier. FINDINGS: AP and lateral views at 0758 hours. Cardiac size seems increased since February. Mild cardiomegaly now. Other mediastinal contours are within normal limits. Visualized tracheal air column is within normal limits. Centrilobular emphysema depicted by CT this year. Pulmonary vascularity appears stable from radiographs last year. No acute pulmonary edema. No pleural effusion or consolidation. No acute osseous abnormality identified. Negative visible bowel gas. IMPRESSION: Emphysema (ICD10-J43.9), and questionable cardiomegaly. No acute cardiopulmonary abnormality. Electronically Signed   By: Genevie Ann M.D.   On: 12/11/2021 08:42    EKG: Independently reviewed.  Sinus, no acute ST changes.  Assessment/Plan Principal Problem:   COPD (chronic obstructive pulmonary disease) (HCC) Active Problems:   Acute on chronic respiratory failure with hypoxia and hypercapnia (HCC)  (please populate well all problems here in Problem List. (For example, if patient is on BP meds at home and you resume or decide  to hold them, it is a problem that needs to be her. Same for CAD, COPD, HLD and so on)  Acute hypoxic respiratory failure -Secondary to acute COPD exacerbation -Short course IV Solu-Medrol bridging for p.o. steroid -Azithromycin -Sputum culture  atypical study -DuoNeb every 6, as needed albuterol -Smoking cessation consultation performed  Chronic systolic CHF -Euvolemic -Continue daily Lasix  Elevated blood pressure without history of HTN -Trial of Coreg  Chronic back pain -Continue tramadol and gabapentin  PAF -In sinus rhythm, continue Xarelto  Cigarette smoker -Cessation consultation performed  DVT prophylaxis: Xarelto Code Status: Full code Family Communication: None at bedside Disposition Plan: Patient sick with acute COPD exacerbation, requiring IV Solu-Medrol, and weaning down oxygen expect more than 2 midnight hospital stay. Consults called: None Admission status: Telemetry admission   Lequita Halt MD [facility] Pager [phone]  12/11/2021, 12:42 PM

## 2021-12-11 NOTE — ED Notes (Signed)
Transport at bedside at this time.

## 2021-12-11 NOTE — ED Triage Notes (Signed)
Pt arrived via [facility] from home for c/o SOB that started yesterday. Pt used home nebulizer yesterday and was able to sleep through the night. Pt woke up this morning again with SOB. Pt wears 2L 02 per  most of the time. EMS gave duoneb and solumedrol 125mg . Per EMS, pt has wheezing in RLL and diminished lungs throughout. PT is A&Ox4. PT does not appear to be in distress at this time.

## 2021-12-11 NOTE — ED Notes (Signed)
Patient transported to X-ray 

## 2021-12-11 NOTE — ED Notes (Signed)
Pt states that his abd is hurting, swelling up, and that he hasn't had a BM that he needs to see provider reference same. Advised would have provider paged

## 2021-12-11 NOTE — ED Notes (Signed)
Transport request placed.

## 2021-12-11 NOTE — ED Notes (Signed)
Received verbal report from Paige P RN at this time 

## 2021-12-12 ENCOUNTER — Inpatient Hospital Stay (HOSPITAL_COMMUNITY): Payer: Medicare HMO

## 2021-12-12 DIAGNOSIS — J9621 Acute and chronic respiratory failure with hypoxia: Secondary | ICD-10-CM

## 2021-12-12 DIAGNOSIS — J9622 Acute and chronic respiratory failure with hypercapnia: Secondary | ICD-10-CM | POA: Diagnosis not present

## 2021-12-12 LAB — BASIC METABOLIC PANEL
Anion gap: 8 (ref 5–15)
BUN: 17 mg/dL (ref 8–23)
CO2: 32 mmol/L (ref 22–32)
Calcium: 8.9 mg/dL (ref 8.9–10.3)
Chloride: 102 mmol/L (ref 98–111)
Creatinine, Ser: 0.91 mg/dL (ref 0.61–1.24)
GFR, Estimated: >60 mL/min (ref >60)
Glucose, Bld: 167 mg/dL — ABNORMAL HIGH (ref 70–99)
Potassium: 5 mmol/L (ref 3.5–5.1)
Sodium: 142 mmol/L (ref 135–145)

## 2021-12-12 LAB — CBC
HCT: 49.2 % (ref 39.0–52.0)
Hemoglobin: 15.1 g/dL (ref 13.0–17.0)
MCH: 27.9 pg (ref 26.0–34.0)
MCHC: 30.7 g/dL (ref 30.0–36.0)
MCV: 90.9 fL (ref 80.0–100.0)
Platelets: 168 10*3/uL (ref 150–400)
RBC: 5.41 MIL/uL (ref 4.22–5.81)
RDW: 14.3 % (ref 11.5–15.5)
WBC: 9.5 10*3/uL (ref 4.0–10.5)
nRBC: 0 % (ref 0.0–0.2)

## 2021-12-12 LAB — HIV ANTIBODY (ROUTINE TESTING W REFLEX): HIV Screen 4th Generation wRfx: NONREACTIVE

## 2021-12-12 LAB — MAGNESIUM: Magnesium: 2.3 mg/dL (ref 1.7–2.4)

## 2021-12-12 LAB — C-REACTIVE PROTEIN: CRP: 0.6 mg/dL (ref <1.0)

## 2021-12-12 LAB — BRAIN NATRIURETIC PEPTIDE: B Natriuretic Peptide: 252.6 pg/mL — ABNORMAL HIGH (ref 0.0–100.0)

## 2021-12-12 MED ORDER — POLYETHYLENE GLYCOL 3350 17 G PO PACK
17.0000 g | PACK | Freq: Every day | ORAL | Status: DC | PRN
  Administered 2021-12-12: 17 g via ORAL
  Filled 2021-12-12: qty 1

## 2021-12-12 MED ORDER — IPRATROPIUM-ALBUTEROL 0.5-2.5 (3) MG/3ML IN SOLN
3.0000 mL | Freq: Four times a day (QID) | RESPIRATORY_TRACT | Status: DC
  Administered 2021-12-12 (×3): 3 mL via RESPIRATORY_TRACT
  Filled 2021-12-12 (×3): qty 3

## 2021-12-12 MED ORDER — PANTOPRAZOLE SODIUM 40 MG PO TBEC
40.0000 mg | DELAYED_RELEASE_TABLET | Freq: Every day | ORAL | Status: DC
  Administered 2021-12-12 – 2021-12-14 (×3): 40 mg via ORAL
  Filled 2021-12-12 (×3): qty 1

## 2021-12-12 MED ORDER — HYDRALAZINE HCL 25 MG PO TABS
25.0000 mg | ORAL_TABLET | Freq: Three times a day (TID) | ORAL | Status: DC
  Administered 2021-12-12 – 2021-12-14 (×6): 25 mg via ORAL
  Filled 2021-12-12 (×6): qty 1

## 2021-12-12 MED ORDER — FUROSEMIDE 10 MG/ML IJ SOLN
40.0000 mg | Freq: Every day | INTRAMUSCULAR | Status: DC
  Administered 2021-12-12 – 2021-12-14 (×3): 40 mg via INTRAVENOUS
  Filled 2021-12-12 (×3): qty 4

## 2021-12-12 MED ORDER — ORAL CARE MOUTH RINSE: 15.0000 mL | OROMUCOSAL | Status: DC | PRN

## 2021-12-12 MED ORDER — METHYLPREDNISOLONE SODIUM SUCC 40 MG IJ SOLR
40.0000 mg | Freq: Two times a day (BID) | INTRAMUSCULAR | Status: DC
  Administered 2021-12-12 – 2021-12-14 (×5): 40 mg via INTRAVENOUS
  Filled 2021-12-12 (×5): qty 1

## 2021-12-12 MED ORDER — ISOSORBIDE MONONITRATE ER 30 MG PO TB24
30.0000 mg | ORAL_TABLET | Freq: Every day | ORAL | Status: DC
  Administered 2021-12-12 – 2021-12-14 (×3): 30 mg via ORAL
  Filled 2021-12-12 (×3): qty 1

## 2021-12-12 NOTE — Evaluation (Signed)
Physical Therapy Evaluation Patient Details Name: Jared [name] MRN: [id number] DOB: May 07, 1956 Today's Date: 12/12/2021  History of Present Illness  66 y.o. male presents to [facility] hospital on 12/11/2021 with increasing SOB and cough. Pt admitted for management of acute on chronic respiratory failure with hypoxia and hypercapnia. PMH includes AVM, CAD, CAP, CHF, COPD, CVA, HTN.  Clinical Impression  Pt presents to PT with deficits in power, balance, activity tolerance, gait.  Pt reports mild weakness and instability, preferring to utilize RW during session. Pt is able to ambulate for household distances, reporting DOE when mobilizing. Pt will benefit from continued acute PT services in an effort to improve activity tolerance and restore independence in mobility. PT recommends HHPT at the time of discharge.     Recommendations for follow up therapy are one component of a multi-disciplinary discharge planning process, led by the attending physician.  Recommendations may be updated based on patient status, additional functional criteria and insurance authorization.  Follow Up Recommendations Home health PT      Assistance Recommended at Discharge Intermittent Supervision/Assistance  Patient can return home with the following  A little help with walking and/or transfers;A little help with bathing/dressing/bathroom;Help with stairs or ramp for entrance;Assist for transportation    Equipment Recommendations None recommended by PT  Recommendations for Other Services       Functional Status Assessment Patient has had a recent decline in their functional status and demonstrates the ability to make significant improvements in function in a reasonable and predictable amount of time.     Precautions / Restrictions Precautions Precautions: Other (comment) (monitor SpO2) Restrictions Weight Bearing Restrictions: No      Mobility  Bed Mobility Overal bed mobility: Modified Independent                   Transfers Overall transfer level: Needs assistance Equipment used: None Transfers: Sit to/from Stand Sit to Stand: Supervision                Ambulation/Gait Ambulation/Gait assistance: Modified independent (Device/Increase time) Gait Distance (Feet): 400 Feet Assistive device: Rolling walker (2 wheels) Gait Pattern/deviations: Step-through pattern Gait velocity: reduced Gait velocity interpretation: <1.8 ft/sec, indicate of risk for recurrent falls   General Gait Details: pt with slowed step-through gait  Stairs            Wheelchair Mobility    Modified Rankin (Stroke Patients Only)       Balance Overall balance assessment: Needs assistance Sitting-balance support: No upper extremity supported, Feet supported Sitting balance-Leahy Scale: Good     Standing balance support: Single extremity supported, Reliant on assistive device for balance Standing balance-Leahy Scale: Poor                               Pertinent Vitals/Pain Pain Assessment Pain Assessment: No/denies pain    Home Living Family/patient expects to be discharged to:: Private residence Living Arrangements: Alone Available Help at Discharge: Family;Available PRN/intermittently (sister) Type of Home: House Home Access: Stairs to enter Entrance Stairs-Rails: Doctor, general practice of Steps: 3   Home Layout: One level Home Equipment: Agricultural consultant (2 wheels) Additional Comments: pt reports he is on 4L [place] at baseline    Prior Function Prior Level of Function : Independent/Modified Independent               ADLs Comments: sister provides groceries, pt enjoys Haematologist  Dominant Hand: Right    Extremity/Trunk Assessment   Upper Extremity Assessment Upper Extremity Assessment: Overall WFL for tasks assessed    Lower Extremity Assessment Lower Extremity Assessment: Generalized weakness    Cervical / Trunk  Assessment Cervical / Trunk Assessment: Normal  Communication   Communication: No difficulties  Cognition Arousal/Alertness: Awake/alert Behavior During Therapy: WFL for tasks assessed/performed Overall Cognitive Status: Within Functional Limits for tasks assessed                                          General Comments General comments (skin integrity, edema, etc.): pt on 4L [place] at rest, PT initiates mobility on 4L [place], pt desats to 84%, PT increases supplemental O2 to 6L [place], pt sats range from 86-89% when mobilizing on 6L. Pt recovers at rest on 5L to mid-90s    Exercises     Assessment/Plan    PT Assessment Patient needs continued PT services  PT Problem List Decreased activity tolerance;Decreased strength;Decreased mobility;Decreased balance;Cardiopulmonary status limiting activity       PT Treatment Interventions DME instruction;Gait training;Functional mobility training;Therapeutic activities;Therapeutic exercise;Balance training;Patient/family education;Stair training    PT Goals (Current goals can be found in the Care Plan section)  Acute Rehab PT Goals Patient Stated Goal: to increase activity tolerance and return to community level mobility PT Goal Formulation: With patient Time For Goal Achievement: 12/26/21 Potential to Achieve Goals: Good Additional Goals Additional Goal #1: Pt will report 1/4 DOE or less when mobilizing for household distances to demonstrate improvement in activity tolerance.    Frequency Min 3X/week     Co-evaluation               AM-PAC PT "6 Clicks" Mobility  Outcome Measure Help needed turning from your back to your side while in a flat bed without using bedrails?: None Help needed moving from lying on your back to sitting on the side of a flat bed without using bedrails?: None Help needed moving to and from a bed to a chair (including a wheelchair)?: A Little Help needed standing up from a chair using your arms  (e.g., wheelchair or bedside chair)?: A Little Help needed to walk in hospital room?: None Help needed climbing 3-5 steps with a railing? : A Little 6 Click Score: 21    End of Session Equipment Utilized During Treatment: Oxygen Activity Tolerance: Patient tolerated treatment well Patient left: in chair;with call bell/phone within reach Nurse Communication: Mobility status PT Visit Diagnosis: Other abnormalities of gait and mobility (R26.89)    Time: 5176-1607 PT Time Calculation (min) (ACUTE ONLY): 21 min   Charges:   PT Evaluation $PT Eval Low Complexity: 1 Low          Arlyss Gandy, PT, DPT Acute Rehabilitation Office [phone]   Arlyss Gandy 12/12/2021, 11:11 AM

## 2021-12-12 NOTE — Progress Notes (Signed)
  Transition of Care [facility]) Screening Note   Patient Details  Name: Dimitriy Carreras Date of Birth: 09-Aug-1955   Transition of Care [facility]) CM/SW Contact:    Harriet Masson, RN Phone Number: 12/12/2021, 3:48 PM    Transition of Care Department [facility]) has reviewed patient and no TOC needs have been identified at this time. We will continue to monitor patient advancement through interdisciplinary progression rounds. If new patient transition needs arise, please place a TOC consult.

## 2021-12-12 NOTE — Progress Notes (Signed)
PROGRESS NOTE                                                                                                                                                                                                             Patient Demographics:    Jared [name], is a 66 y.o. male, DOB - 03/24/1956, [id number]  Outpatient Primary MD for the patient is Charlott Rakes, MD    LOS - 1  Admit date - 12/11/2021    Chief Complaint  Patient presents with   Shortness of Breath       Brief Narrative (HPI from H&P)   66 y.o. male with medical history significant of COPD Gold stage III, chronic hypoxic respite failure on home O2 2 L as needed, CAD, chronic systolic CHF LVEF [id number] secondary to ischemic cardiomyopathy, PAF on Xarelto, cigarette smoker, chronic back pain, presented with increasing cough shortness of breath x 4-5 days, he was diagnosed with COPD exacerbation with acute on chronic hypoxic respiratory failure and admitted to the hospital.   Subjective:    [facility] today has, No headache, No chest pain, No abdominal pain - No Nausea, No new weakness tingling or numbness, +ve SOB.   Assessment  & Plan :   Acute on chronic hypoxic respiratory failure due to COPD exacerbation along with acute on chronic combined systolic and diastolic CHF last EF around 45% .  He is at baseline around 3 L nasal cannula oxygen currently on 4 L, still short of breath, has some orthopnea and Rales along with wheezes.  He has been placed on IV steroids, azithromycin, will initiate IV Lasix.  Encourage him to sit in chair use I-S and flutter valve for pulmonary toiletry.  Advance activity and gradually titrate down oxygen.  We will also get a repeat echocardiogram.  Acute on chronic combined systolic and diastolic CHF with EF [id number].  see above.  Hypertension.  In poor control.  Continue Coreg at present dose add hydralazine and Imdur along with IV  Lasix.  Paroxysmal atrial fibrillation [place] vas 2 score of greater than 4.  Continue Coreg along with Xarelto.  Ongoing cigarette smoking.  Counseled to quit.        Condition - Fair  Family Communication  :  none present  Code Status :  Full  Consults  :  None  PUD Prophylaxis :  PPI   Procedures  :     TTE      Disposition Plan  :    Status is: Inpatient  DVT Prophylaxis  :    rivaroxaban (XARELTO) tablet 20 mg   Lab Results  Component Value Date   PLT 168 12/12/2021    Diet :  Diet Order             Diet Heart Room service appropriate? Yes; Fluid consistency: Thin; Fluid restriction: 2000 mL Fluid  Diet effective now                    Inpatient Medications  Scheduled Meds:  atorvastatin  80 mg Oral Daily   azithromycin  500 mg Oral Daily   carvedilol  3.125 mg Oral BID WC   ferrous sulfate  325 mg Oral BID WC   furosemide  40 mg Intravenous Daily   gabapentin  300 mg Oral QHS   guaiFENesin  1,200 mg Oral BID   hydrALAZINE  25 mg Oral Q8H   ipratropium-albuterol  3 mL Nebulization Q6H   isosorbide mononitrate  30 mg Oral Daily   methylPREDNISolone (SOLU-MEDROL) injection  40 mg Intravenous Q12H   mometasone-formoterol  2 puff Inhalation BID   nicotine  7 mg Transdermal Daily   pantoprazole  40 mg Oral Daily   rivaroxaban  20 mg Oral Q supper   Continuous Infusions:  albuterol Stopped (12/11/21 1849)   PRN Meds:.albuterol, mouth rinse, polyethylene glycol, traMADol  Time Spent in minutes  30  Susa Raring M.D on 12/12/2021 at 9:49 AM  To page go to [email or web address]   [facility] -  Office  [phone]  See all Orders from today for further details    Objective:   Vitals:   12/12/21 0136 12/12/21 0400 12/12/21 0652 12/12/21 0754  BP:  (!) 152/88  (!) 155/93  Pulse:  80  91  Resp:  15    Temp:  98.7 F (37.1 C)  98.6 F (37 C)  TempSrc:  Oral  Oral  SpO2: 97% 92% 91% 92%  Weight:      Height:        Wt  Readings from Last 3 Encounters:  12/11/21 103.6 kg  09/20/21 103.6 kg  06/27/21 102.8 kg    No intake or output data in the 24 hours ending 12/12/21 0949   Physical Exam  Awake Alert, No new F.N deficits, Normal affect [place].AT,PERRAL Supple Neck, No JVD,   Symmetrical Chest wall movement, minimal to moderate air entry bilaterally with bibasilar wheezing and Rales RRR,No Gallops,Rubs or new Murmurs,  +ve B.Sounds, Abd Soft, No tenderness,   No Cyanosis, Clubbing or edema       Data Review:    CBC Recent Labs  Lab 12/11/21 0750 12/11/21 0903 12/12/21 0100  WBC 6.9  --  9.5  HGB 15.1 14.6 15.1  HCT 51.9 43.0 49.2  PLT 176  --  168  MCV 92.5  --  90.9  MCH 26.9  --  27.9  MCHC 29.1*  --  30.7  RDW 14.4  --  14.3  LYMPHSABS 2.6  --   --   MONOABS 0.6  --   --   EOSABS 0.1  --   --   BASOSABS 0.0  --   --     Electrolytes Recent Labs  Lab 12/11/21 0750 12/11/21 0903 12/12/21 0623  NA 142 145 142  K 5.0 3.9 5.0  CL 104  --  102  CO2 32  --  32  GLUCOSE 100*  --  167*  BUN 15  --  17  CREATININE 1.06  --  0.91  CALCIUM 8.5*  --  8.9  AST 31  --   --   ALT 15  --   --   ALKPHOS 47  --   --   BILITOT 0.6  --   --   ALBUMIN 3.7  --   --   MG  --   --  2.3  CRP  --   --  0.6  BNP 447.3*  --  252.6*   ------------------------------------------------------------------------------------------------------------------ No results for input(s): "CHOL", "HDL", "LDLCALC", "TRIG", "CHOLHDL", "LDLDIRECT" in the last 72 hours.  Lab Results  Component Value Date   HGBA1C 5.7 (H) 11/15/2020   ------------------------------------------------------------------------------------------------------------------ ID Labs Recent Labs  Lab 12/11/21 0750 12/12/21 0100 12/12/21 0623  WBC 6.9 9.5  --   PLT 176 168  --   CRP  --   --  0.6  CREATININE 1.06  --  0.91     Micro Results No results found for this or any previous visit (from the past 240 hour(s)).  Radiology  Reports DG Chest Port 1 View  Result Date: 12/12/2021 CLINICAL DATA:  Shortness of breath.  History of COPD. EXAM: PORTABLE CHEST 1 VIEW COMPARISON:  12/11/2021 FINDINGS: Unchanged cardiac enlargement. New pulmonary vascular congestion. No pleural effusion or frank interstitial edema. No airspace opacity. Remote healed left posterior rib fracture deformities identified. IMPRESSION: 1. Stable cardiac enlargement. 2. New pulmonary vascular congestion. Electronically Signed   By: Signa Kell M.D.   On: 12/12/2021 07:38   DG Abd 2 Views  Result Date: 12/12/2021 CLINICAL DATA:  Abdominal pain EXAM: ABDOMEN - 2 VIEW COMPARISON:  None Available. FINDINGS: Scattered large and small bowel gas is noted. No obstructive changes are seen. No free air is noted. No mass lesion or abnormal calcifications are seen. Endoscopic clip is noted in the stomach. IMPRESSION: No evidence of obstruction.  No acute abnormality noted. Electronically Signed   By: Alcide Clever M.D.   On: 12/12/2021 00:22   DG Chest 2 View  Result Date: 12/11/2021 CLINICAL DATA:  66 year old male with shortness of breath since yesterday. EXAM: CHEST - 2 VIEW COMPARISON:  Chest CT 07/12/2021 and earlier. FINDINGS: AP and lateral views at 0758 hours. Cardiac size seems increased since February. Mild cardiomegaly now. Other mediastinal contours are within normal limits. Visualized tracheal air column is within normal limits. Centrilobular emphysema depicted by CT this year. Pulmonary vascularity appears stable from radiographs last year. No acute pulmonary edema. No pleural effusion or consolidation. No acute osseous abnormality identified. Negative visible bowel gas. IMPRESSION: Emphysema (ICD10-J43.9), and questionable cardiomegaly. No acute cardiopulmonary abnormality. Electronically Signed   By: Odessa Fleming M.D.   On: 12/11/2021 08:42

## 2021-12-12 NOTE — Progress Notes (Signed)
Pt arrived to unit in wheelchair. Alert and oriented x 4. On 3 L/[place]. Short of breath with exertion. Vitals stable.

## 2021-12-13 ENCOUNTER — Inpatient Hospital Stay (HOSPITAL_COMMUNITY): Payer: Medicare HMO

## 2021-12-13 DIAGNOSIS — I5022 Chronic systolic (congestive) heart failure: Secondary | ICD-10-CM | POA: Diagnosis not present

## 2021-12-13 DIAGNOSIS — J9622 Acute and chronic respiratory failure with hypercapnia: Secondary | ICD-10-CM | POA: Diagnosis not present

## 2021-12-13 DIAGNOSIS — J9621 Acute and chronic respiratory failure with hypoxia: Secondary | ICD-10-CM | POA: Diagnosis not present

## 2021-12-13 LAB — CBC WITH DIFFERENTIAL/PLATELET
Abs Immature Granulocytes: 0.06 10*3/uL (ref 0.00–0.07)
Basophils Absolute: 0 10*3/uL (ref 0.0–0.1)
Basophils Relative: 0 %
Eosinophils Absolute: 0 10*3/uL (ref 0.0–0.5)
Eosinophils Relative: 0 %
HCT: 48.9 % (ref 39.0–52.0)
Hemoglobin: 14.5 g/dL (ref 13.0–17.0)
Immature Granulocytes: 0 %
Lymphocytes Relative: 5 %
Lymphs Abs: 0.8 10*3/uL (ref 0.7–4.0)
MCH: 27.3 pg (ref 26.0–34.0)
MCHC: 29.7 g/dL — ABNORMAL LOW (ref 30.0–36.0)
MCV: 91.9 fL (ref 80.0–100.0)
Monocytes Absolute: 0.4 10*3/uL (ref 0.1–1.0)
Monocytes Relative: 2 %
Neutro Abs: 13.8 10*3/uL — ABNORMAL HIGH (ref 1.7–7.7)
Neutrophils Relative %: 93 %
Platelets: 167 10*3/uL (ref 150–400)
RBC: 5.32 MIL/uL (ref 4.22–5.81)
RDW: 14.2 % (ref 11.5–15.5)
WBC: 15 10*3/uL — ABNORMAL HIGH (ref 4.0–10.5)
nRBC: 0 % (ref 0.0–0.2)

## 2021-12-13 LAB — COMPREHENSIVE METABOLIC PANEL
ALT: 17 U/L (ref 0–44)
AST: 23 U/L (ref 15–41)
Albumin: 3.5 g/dL (ref 3.5–5.0)
Alkaline Phosphatase: 47 U/L (ref 38–126)
Anion gap: 4 — ABNORMAL LOW (ref 5–15)
BUN: 24 mg/dL — ABNORMAL HIGH (ref 8–23)
CO2: 35 mmol/L — ABNORMAL HIGH (ref 22–32)
Calcium: 8.8 mg/dL — ABNORMAL LOW (ref 8.9–10.3)
Chloride: 98 mmol/L (ref 98–111)
Creatinine, Ser: 1.1 mg/dL (ref 0.61–1.24)
GFR, Estimated: >60 mL/min (ref >60)
Glucose, Bld: 190 mg/dL — ABNORMAL HIGH (ref 70–99)
Potassium: 5.1 mmol/L (ref 3.5–5.1)
Sodium: 137 mmol/L (ref 135–145)
Total Bilirubin: 0.5 mg/dL (ref 0.3–1.2)
Total Protein: 6 g/dL — ABNORMAL LOW (ref 6.5–8.1)

## 2021-12-13 LAB — ECHOCARDIOGRAM COMPLETE
AR max vel: 2.68 cm2
AV Peak grad: 6.7 mmHg
Ao pk vel: 1.29 m/s
Area-P 1/2: 4.1 cm2
Calc EF: 48.5 %
Height: 75 in
S' Lateral: 4.4 cm
Single Plane A2C EF: 48.4 %
Single Plane A4C EF: 48.4 %
Weight: 3654.34 oz

## 2021-12-13 LAB — MYCOPLASMA PNEUMONIAE ANTIBODY, IGM: Mycoplasma pneumo IgM: <770 U/mL (ref 0–769)

## 2021-12-13 LAB — MAGNESIUM: Magnesium: 2.4 mg/dL (ref 1.7–2.4)

## 2021-12-13 MED ORDER — SODIUM ZIRCONIUM CYCLOSILICATE 10 G PO PACK
10.0000 g | PACK | Freq: Once | ORAL | Status: AC
  Administered 2021-12-13: 10 g via ORAL
  Filled 2021-12-13: qty 1

## 2021-12-13 MED ORDER — IPRATROPIUM-ALBUTEROL 0.5-2.5 (3) MG/3ML IN SOLN
3.0000 mL | Freq: Three times a day (TID) | RESPIRATORY_TRACT | Status: DC
Start: 2021-12-13 — End: 2021-12-14
  Administered 2021-12-13 – 2021-12-14 (×4): 3 mL via RESPIRATORY_TRACT
  Filled 2021-12-13 (×5): qty 3

## 2021-12-13 NOTE — TOC Initial Note (Signed)
Transition of Care [facility]) - Initial/Assessment Note    Patient Details  Name: Jared [name] MRN: [id number] Date of Birth: 08-13-1955  [facility]) CM/SW Contact:    Harriet Masson, RN Phone Number: 12/13/2021, 2:08 PM  Clinical Narrative:                 Spoke to patient regarding transition needs. Patient states he lives in a boarding house and has 4L home 02 from [employer]. Sister takes him to apts and can take him home at discharge. Orders for home health. Choice offered and patient deferred to [facility] to find highly rated agency. [name] with [facility] accepted referral.  Address, Phone number and PCP verified.  TOC will continue to follow for needs.  Expected Discharge Plan: Home w Home Health Services Barriers to Discharge: Continued Medical Work up   Patient Goals and CMS Choice Patient states their goals for this hospitalization and ongoing recovery are:: return home CMS Medicare.gov Compare Post Acute Care list provided to:: Patient Choice offered to / list presented to : Patient  Expected Discharge Plan and Services Expected Discharge Plan: Home w Home Health Services   Discharge Planning Services: CM Consult Post Acute Care Choice: Home Health Living arrangements for the past 2 months: Boarding House                           HH Arranged: PT [facility] Agency: [facility] Date [facility] Agency Contacted: 12/13/21 Time HH Agency Contacted: 1408 Representative spoke with at [facility] Agency: [name]  Prior Living Arrangements/Services Living arrangements for the past 2 months: [employer] Lives with:: Roommate Patient language and need for interpreter reviewed:: Yes Do you feel safe going back to the place where you live?: Yes      Need for Family Participation in Patient Care: Yes (Comment) Care giver support system in place?: Yes (comment)   Criminal Activity/Legal Involvement Pertinent to Current Situation/Hospitalization: No - Comment as needed  Activities of  Daily Living Home Assistive Devices/Equipment: Eyeglasses, Oxygen ADL Screening (condition at time of admission) Patient's cognitive ability adequate to safely complete daily activities?: Yes Is the patient deaf or have difficulty hearing?: No Does the patient have difficulty seeing, even when wearing glasses/contacts?: No Does the patient have difficulty concentrating, remembering, or making decisions?: No Patient able to express need for assistance with ADLs?: Yes Does the patient have difficulty dressing or bathing?: No Independently performs ADLs?: Yes (appropriate for developmental age) Does the patient have difficulty walking or climbing stairs?: Yes (just short of breath related) Weakness of Legs: None Weakness of Arms/Hands: None  Permission Sought/Granted Permission sought to share information with : Case Manager       Permission granted to share info w AGENCY: HH        Emotional Assessment Appearance:: Appears stated age Attitude/Demeanor/Rapport: Engaged Affect (typically observed): Accepting Orientation: : Oriented to Self, Oriented to Place, Oriented to  Time, Oriented to Situation Alcohol / Substance Use: Not Applicable Psych Involvement: No (comment)  Admission diagnosis:  COPD (chronic obstructive pulmonary disease) (HCC) [J44.9] COPD exacerbation (HCC) [J44.1] Patient Active Problem List   Diagnosis Date Noted   COPD (chronic obstructive pulmonary disease) (HCC) 12/11/2021   Closed fracture of distal end of fibula, unspecified fracture morphology, initial encounter    Pre-syncope    Syncope and collapse 02/07/2021   Elevated troponin 01/30/2021   History of GI bleed    Gastric ulcer due to Helicobacter  pylori and nonsteroidal anti-inflammatory drug (NSAID)    AKI (acute kidney injury) (HCC) 11/18/2020   Epigastric pain    Acute gastric ulcer with hemorrhage    Acute blood loss anemia    Acute upper GI bleed 11/12/2020   Hematemesis with nausea    Acute  GI bleeding 06/11/2019   Syncope 06/11/2019   Chronic right maxillary sinusitis 01/29/2019   Chronic respiratory failure with hypoxia and hypercapnia (HCC) 01/01/2019   Acute on chronic respiratory failure with hypoxia and hypercapnia (HCC) 11/10/2018   GI bleed 10/27/2018   Chronic anticoagulation 09/27/2018   AV malformation of gastrointestinal tract    Occult GI bleeding    Heme positive stool    Iron deficiency anemia    Acute on chronic anemia 09/04/2018   Noncompliance 06/14/2017   Elevated LFTs 08/13/2016   Chronic respiratory failure with hypoxia (HCC) 07/24/2016   Atrial flutter (HCC)    Hepatic congestion 07/02/2016   Acute on chronic combined systolic and diastolic CHF (congestive heart failure) (HCC) 07/01/2016   COPD with acute exacerbation (HCC) 06/03/2016   Prediabetes 05/14/2016   Hemoptysis 05/06/2016   COPD GOLD III/still smoking  02/29/2016   Cigarette smoker 01/09/2016   CAD (coronary artery disease) 10/07/2013   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, [address] [place] [address] Phone: [phone] Fax: [phone]  [facility] - [place], [place] - [address] [place] [address] Phone: [phone] Fax: [phone]     Social Determinants of Health (SDOH) Interventions    Readmission Risk Interventions    02/01/2021    3:22 PM  Readmission Risk Prevention Plan  Transportation Screening Complete  PCP or Specialist Appt within 5-7 Days Complete  Home Care Screening Complete  Medication Review (RN CM) Complete

## 2021-12-13 NOTE — Evaluation (Signed)
Occupational Therapy Evaluation and Discharge Patient Details Name: Jared [name] MRN: [id number] DOB: [birth year] Today's Date: 12/13/2021   History of Present Illness 66 y.o. male presents to [facility] hospital on 12/11/2021 with increasing SOB and cough. Pt admitted for management of acute on chronic respiratory failure with hypoxia and hypercapnia. PMH includes AVM, CAD, CAP, CHF, COPD, CVA, HTN.   Clinical Impression   Pt is functioning modified independently. He is aware of pursed lip breathing techniques and employs energy conservation strategies in ADLs. He has a shower seat, but does not use. Pt rarely leaves his boarding house, and mostly stays in his room in the bed. Pt reports he continues to smoke.      Recommendations for follow up therapy are one component of a multi-disciplinary discharge planning process, led by the attending physician.  Recommendations may be updated based on patient status, additional functional criteria and insurance authorization.   Follow Up Recommendations  No OT follow up    Assistance Recommended at Discharge PRN  Patient can return home with the following Assist for transportation    Functional Status Assessment  Patient has had a recent decline in their functional status and demonstrates the ability to make significant improvements in function in a reasonable and predictable amount of time.  Equipment Recommendations  None recommended by OT    Recommendations for Other Services       Precautions / Restrictions        Mobility Bed Mobility               General bed mobility comments: in chair    Transfers Overall transfer level: Modified independent Equipment used: None               General transfer comment: pt managing his 02 line independently      Balance Overall balance assessment: No apparent balance deficits (not formally assessed)                                         ADL either performed or  assessed with clinical judgement   ADL Overall ADL's : Modified independent                                             Vision Baseline Vision/History: 0 No visual deficits Ability to See in Adequate Light: 0 Adequate       Perception     Praxis      Pertinent Vitals/Pain Pain Assessment Pain Assessment: No/denies pain     Hand Dominance Right   Extremity/Trunk Assessment Upper Extremity Assessment Upper Extremity Assessment: Overall WFL for tasks assessed   Lower Extremity Assessment Lower Extremity Assessment: Defer to PT evaluation   Cervical / Trunk Assessment Cervical / Trunk Assessment: Normal   Communication Communication Communication: No difficulties   Cognition Arousal/Alertness: Awake/alert Behavior During Therapy: WFL for tasks assessed/performed Overall Cognitive Status: Within Functional Limits for tasks assessed                                 General Comments: pt lives in a boarding house, minimally active     General Comments       Exercises     Shoulder Instructions  Home Living Family/patient expects to be discharged to:: Private residence Living Arrangements: Alone Available Help at Discharge: Family;Available PRN/intermittently (sister) Type of Home: House Home Access: Stairs to enter [employer] of Steps: 3 Entrance Stairs-Rails: Right;Left Home Layout: One level     Bathroom Shower/Tub: Chief Strategy Officer: Standard     Home Equipment: Agricultural consultant (2 wheels);Shower seat   Additional Comments: pt reports he is on 4L [place] at baseline      Prior Functioning/Environment Prior Level of Function : Independent/Modified Independent               ADLs Comments: sister provides groceries, pt enjoys Clinical cytogeneticist, stands to shower        OT Problem List:        OT Treatment/Interventions:      OT Goals(Current goals can be found in the care plan  section)    OT Frequency:      Co-evaluation              AM-PAC OT "6 Clicks" Daily Activity     Outcome Measure Help from another person eating meals?: None Help from another person taking care of personal grooming?: None Help from another person toileting, which includes using toliet, bedpan, or urinal?: None Help from another person bathing (including washing, rinsing, drying)?: None Help from another person to put on and taking off regular upper body clothing?: None Help from another person to put on and taking off regular lower body clothing?: None 6 Click Score: 24   End of Session Equipment Utilized During Treatment: Oxygen (3L)  Activity Tolerance: Patient tolerated treatment well Patient left: in chair;with call bell/phone within reach  OT Visit Diagnosis: Other (comment) (decreased activity tolerance)                Time: 7829-5621 OT Time Calculation (min): 15 min Charges:  OT General Charges $OT Visit: 1 Visit OT Evaluation $OT Eval Low Complexity: 1 Low  Berna Spare, OTR/L Acute Rehabilitation Services Office: [phone]  Evern Bio 12/13/2021, 11:41 AM

## 2021-12-13 NOTE — Plan of Care (Signed)
  Problem: Education: Goal: Knowledge of disease or condition will improve Outcome: Progressing   Problem: Activity: Goal: Ability to tolerate increased activity will improve Outcome: Progressing   Problem: Respiratory: Goal: Ability to maintain a clear airway will improve Outcome: Progressing   Problem: Education: Goal: Knowledge of General Education information will improve Description: Including pain rating scale, medication(s)/side effects and non-pharmacologic comfort measures Outcome: Progressing   Problem: Clinical Measurements: Goal: Ability to maintain clinical measurements within normal limits will improve Outcome: Progressing

## 2021-12-13 NOTE — Progress Notes (Signed)
PROGRESS NOTE                                                                                                                                                                                                             Patient Demographics:    Jared [name], is a 66 y.o. male, DOB - May 23, 1956, LZJ:[id number]  Outpatient Primary MD for the patient is [name], MD    LOS - 2  Admit date - 12/11/2021    Chief Complaint  Patient presents with   Shortness of Breath       Brief Narrative (HPI from H&P)   66 y.o. male with medical history significant of COPD Gold stage III, chronic hypoxic respite failure on home O2 2 L as needed, CAD, chronic systolic CHF LVEF 45-50% secondary to ischemic cardiomyopathy, PAF on Xarelto, cigarette smoker, chronic back pain, presented with increasing cough shortness of breath x 4-5 days, he was diagnosed with COPD exacerbation with acute on chronic hypoxic respiratory failure and admitted to the hospital.   Subjective:   Patient in bed, appears comfortable, denies any headache, no fever, no chest pain or pressure, much improved shortness of breath , no abdominal pain. No focal weakness.   Assessment  & Plan :   Acute on chronic hypoxic respiratory failure due to COPD exacerbation along with acute on chronic combined systolic and diastolic CHF last EF around 45% .  He is at baseline around 3 L nasal cannula oxygen currently on 4 L, still short of breath, has some orthopnea and Rales along with wheezes.  He has been placed on IV steroids, azithromycin, will initiate IV Lasix.  Encourage him to sit in chair use I-S and flutter valve for pulmonary toiletry.  Advance activity and gradually titrate down oxygen.  Repeat echocardiogram is pending.  Clinically improved on 12/13/2021.  Acute on chronic combined systolic and diastolic CHF with EF 45%.  see above.  Hypertension.  In poor control.  Continue  Coreg at present dose add hydralazine and Imdur along with IV Lasix.  Paroxysmal atrial fibrillation [place] vas 2 score of greater than 4.  Continue Coreg along with Xarelto.  Ongoing cigarette smoking.  Counseled to quit.      Condition - Fair  Family Communication  :  none present  Code Status :  Full  Consults  :  None  PUD Prophylaxis :  PPI   Procedures  :     TTE      Disposition Plan  :    Status is: Inpatient  DVT Prophylaxis  :    rivaroxaban (XARELTO) tablet 20 mg   Lab Results  Component Value Date   PLT 167 12/13/2021    Diet :  Diet Order             Diet Heart Room service appropriate? Yes; Fluid consistency: Thin; Fluid restriction: 2000 mL Fluid  Diet effective now                    Inpatient Medications  Scheduled Meds:  atorvastatin  80 mg Oral Daily   azithromycin  500 mg Oral Daily   carvedilol  3.125 mg Oral BID WC   ferrous sulfate  325 mg Oral BID WC   furosemide  40 mg Intravenous Daily   gabapentin  300 mg Oral QHS   guaiFENesin  1,200 mg Oral BID   hydrALAZINE  25 mg Oral Q8H   ipratropium-albuterol  3 mL Nebulization TID   isosorbide mononitrate  30 mg Oral Daily   methylPREDNISolone (SOLU-MEDROL) injection  40 mg Intravenous Q12H   mometasone-formoterol  2 puff Inhalation BID   nicotine  7 mg Transdermal Daily   pantoprazole  40 mg Oral Daily   rivaroxaban  20 mg Oral Q supper   Continuous Infusions:  albuterol Stopped (12/11/21 1849)   PRN Meds:.albuterol, mouth rinse, polyethylene glycol, traMADol  Time Spent in minutes  30  Lala Lund M.D on 12/13/2021 at 9:52 AM  To page go to [email or web address]   [facility] -  Office  [phone]  See all Orders from today for further details    Objective:   Vitals:   12/13/21 0600 12/13/21 0758 12/13/21 0800 12/13/21 0837  BP:    133/79  Pulse: 84   89  Resp: 20   18  Temp:    98 F (36.7 C)  TempSrc:    Oral  SpO2: 98% 95% 94% 90%  Weight:       Height:        Wt Readings from Last 3 Encounters:  12/11/21 103.6 kg  09/20/21 103.6 kg  06/27/21 102.8 kg     Intake/Output Summary (Last 24 hours) at 12/13/2021 0952 Last data filed at 12/13/2021 0837 Gross per 24 hour  Intake 720 ml  Output 500 ml  Net 220 ml     Physical Exam  Awake Alert, No new F.N deficits, Normal affect [place].AT,PERRAL Supple Neck, No JVD,   Symmetrical Chest wall movement, moderate air movement bilaterally with some wheezes and rails RRR,No Gallops, Rubs or new Murmurs,  +ve B.Sounds, Abd Soft, No tenderness,   No Cyanosis, Clubbing or edema     Data Review:    CBC Recent Labs  Lab 12/11/21 0750 12/11/21 0903 12/12/21 0100 12/13/21 0133  WBC 6.9  --  9.5 15.0*  HGB 15.1 14.6 15.1 14.5  HCT 51.9 43.0 49.2 48.9  PLT 176  --  168 167  MCV 92.5  --  90.9 91.9  MCH 26.9  --  27.9 27.3  MCHC 29.1*  --  30.7 29.7*  RDW 14.4  --  14.3 14.2  LYMPHSABS 2.6  --   --  0.8  MONOABS 0.6  --   --  0.4  EOSABS 0.1  --   --  0.0  BASOSABS 0.0  --   --  0.0    Electrolytes Recent Labs  Lab 12/11/21 0750 12/11/21 0903 12/12/21 0623 12/13/21 0133  NA 142 145 142 137  K 5.0 3.9 5.0 5.1  CL 104  --  102 98  CO2 32  --  32 35*  GLUCOSE 100*  --  167* 190*  BUN 15  --  17 24*  CREATININE 1.06  --  0.91 1.10  CALCIUM 8.5*  --  8.9 8.8*  AST 31  --   --  23  ALT 15  --   --  17  ALKPHOS 47  --   --  47  BILITOT 0.6  --   --  0.5  ALBUMIN 3.7  --   --  3.5  MG  --   --  2.3 2.4  CRP  --   --  0.6  --   BNP 447.3*  --  252.6*  --    ------------------------------------------------------------------------------------------------------------------ No results for input(s): "CHOL", "HDL", "LDLCALC", "TRIG", "CHOLHDL", "LDLDIRECT" in the last 72 hours.  Lab Results  Component Value Date   HGBA1C 5.7 (H) 11/15/2020   ------------------------------------------------------------------------------------------------------------------ ID  Labs Recent Labs  Lab 12/11/21 0750 12/12/21 0100 12/12/21 0623 12/13/21 0133  WBC 6.9 9.5  --  15.0*  PLT 176 168  --  167  CRP  --   --  0.6  --   CREATININE 1.06  --  0.91 1.10     Micro Results No results found for this or any previous visit (from the past 240 hour(s)).  Radiology Reports DG Chest Port 1 View  Result Date: 12/12/2021 CLINICAL DATA:  Shortness of breath.  History of COPD. EXAM: PORTABLE CHEST 1 VIEW COMPARISON:  12/11/2021 FINDINGS: Unchanged cardiac enlargement. New pulmonary vascular congestion. No pleural effusion or frank interstitial edema. No airspace opacity. Remote healed left posterior rib fracture deformities identified. IMPRESSION: 1. Stable cardiac enlargement. 2. New pulmonary vascular congestion. Electronically Signed   By: Signa Kell M.D.   On: 12/12/2021 07:38   DG Abd 2 Views  Result Date: 12/12/2021 CLINICAL DATA:  Abdominal pain EXAM: ABDOMEN - 2 VIEW COMPARISON:  None Available. FINDINGS: Scattered large and small bowel gas is noted. No obstructive changes are seen. No free air is noted. No mass lesion or abnormal calcifications are seen. Endoscopic clip is noted in the stomach. IMPRESSION: No evidence of obstruction.  No acute abnormality noted. Electronically Signed   By: Alcide Clever M.D.   On: 12/12/2021 00:22   DG Chest 2 View  Result Date: 12/11/2021 CLINICAL DATA:  66 year old male with shortness of breath since yesterday. EXAM: CHEST - 2 VIEW COMPARISON:  Chest CT 07/12/2021 and earlier. FINDINGS: AP and lateral views at 0758 hours. Cardiac size seems increased since February. Mild cardiomegaly now. Other mediastinal contours are within normal limits. Visualized tracheal air column is within normal limits. Centrilobular emphysema depicted by CT this year. Pulmonary vascularity appears stable from radiographs last year. No acute pulmonary edema. No pleural effusion or consolidation. No acute osseous abnormality identified. Negative visible  bowel gas. IMPRESSION: Emphysema (ICD10-J43.9), and questionable cardiomegaly. No acute cardiopulmonary abnormality. Electronically Signed   By: Odessa Fleming M.D.   On: 12/11/2021 08:42

## 2021-12-14 ENCOUNTER — Inpatient Hospital Stay (HOSPITAL_COMMUNITY): Payer: Medicare HMO

## 2021-12-14 ENCOUNTER — Other Ambulatory Visit (HOSPITAL_COMMUNITY): Payer: Self-pay

## 2021-12-14 DIAGNOSIS — J9621 Acute and chronic respiratory failure with hypoxia: Secondary | ICD-10-CM | POA: Diagnosis not present

## 2021-12-14 DIAGNOSIS — J9622 Acute and chronic respiratory failure with hypercapnia: Secondary | ICD-10-CM | POA: Diagnosis not present

## 2021-12-14 LAB — CBC WITH DIFFERENTIAL/PLATELET
Abs Immature Granulocytes: 0.06 10*3/uL (ref 0.00–0.07)
Basophils Absolute: 0 10*3/uL (ref 0.0–0.1)
Basophils Relative: 0 %
Eosinophils Absolute: 0 10*3/uL (ref 0.0–0.5)
Eosinophils Relative: 0 %
HCT: 50.7 % (ref 39.0–52.0)
Hemoglobin: 15 g/dL (ref 13.0–17.0)
Immature Granulocytes: 0 %
Lymphocytes Relative: 7 %
Lymphs Abs: 0.9 10*3/uL (ref 0.7–4.0)
MCH: 27.2 pg (ref 26.0–34.0)
MCHC: 29.6 g/dL — ABNORMAL LOW (ref 30.0–36.0)
MCV: 91.8 fL (ref 80.0–100.0)
Monocytes Absolute: 0.3 10*3/uL (ref 0.1–1.0)
Monocytes Relative: 2 %
Neutro Abs: 12.5 10*3/uL — ABNORMAL HIGH (ref 1.7–7.7)
Neutrophils Relative %: 91 %
Platelets: 164 10*3/uL (ref 150–400)
RBC: 5.52 MIL/uL (ref 4.22–5.81)
RDW: 14 % (ref 11.5–15.5)
WBC: 13.7 10*3/uL — ABNORMAL HIGH (ref 4.0–10.5)
nRBC: 0 % (ref 0.0–0.2)

## 2021-12-14 LAB — MAGNESIUM: Magnesium: 2.4 mg/dL (ref 1.7–2.4)

## 2021-12-14 LAB — COMPREHENSIVE METABOLIC PANEL
ALT: 18 U/L (ref 0–44)
AST: 25 U/L (ref 15–41)
Albumin: 3.6 g/dL (ref 3.5–5.0)
Alkaline Phosphatase: 46 U/L (ref 38–126)
Anion gap: 6 (ref 5–15)
BUN: 26 mg/dL — ABNORMAL HIGH (ref 8–23)
CO2: 36 mmol/L — ABNORMAL HIGH (ref 22–32)
Calcium: 9.1 mg/dL (ref 8.9–10.3)
Chloride: 97 mmol/L — ABNORMAL LOW (ref 98–111)
Creatinine, Ser: 1.12 mg/dL (ref 0.61–1.24)
GFR, Estimated: >60 mL/min (ref >60)
Glucose, Bld: 144 mg/dL — ABNORMAL HIGH (ref 70–99)
Potassium: 4.5 mmol/L (ref 3.5–5.1)
Sodium: 139 mmol/L (ref 135–145)
Total Bilirubin: 0.7 mg/dL (ref 0.3–1.2)
Total Protein: 6.3 g/dL — ABNORMAL LOW (ref 6.5–8.1)

## 2021-12-14 LAB — LEGIONELLA PNEUMOPHILA SEROGP 1 UR AG: L. pneumophila Serogp 1 Ur Ag: NEGATIVE

## 2021-12-14 MED ORDER — FUROSEMIDE 10 MG/ML IJ SOLN
40.0000 mg | Freq: Once | INTRAMUSCULAR | Status: AC
  Administered 2021-12-14: 40 mg via INTRAVENOUS
  Filled 2021-12-14: qty 4

## 2021-12-14 MED ORDER — FUROSEMIDE 40 MG PO TABS
40.0000 mg | ORAL_TABLET | Freq: Two times a day (BID) | ORAL | 0 refills | Status: DC
  Filled 2021-12-14: qty 60, 30d supply, fill #0

## 2021-12-14 MED ORDER — FUROSEMIDE 10 MG/ML IJ SOLN: 40.0000 mg | Freq: Once | INTRAMUSCULAR | Status: DC

## 2021-12-14 MED ORDER — HYDRALAZINE HCL 50 MG PO TABS
50.0000 mg | ORAL_TABLET | Freq: Three times a day (TID) | ORAL | 0 refills | Status: DC
  Filled 2021-12-14: qty 90, 30d supply, fill #0

## 2021-12-14 MED ORDER — CARVEDILOL 3.125 MG PO TABS
3.1250 mg | ORAL_TABLET | Freq: Two times a day (BID) | ORAL | 0 refills | Status: DC
  Filled 2021-12-14: qty 60, 30d supply, fill #0

## 2021-12-14 MED ORDER — NICOTINE 21 MG/24HR TD PT24
21.0000 mg | MEDICATED_PATCH | TRANSDERMAL | 0 refills | Status: AC
  Filled 2021-12-14: qty 28, 28d supply, fill #0
  Filled 2022-01-04: qty 28, 28d supply, fill #1

## 2021-12-14 MED ORDER — PREDNISONE 5 MG PO TABS
ORAL_TABLET | ORAL | 0 refills | Status: DC
  Filled 2021-12-14: qty 65, 16d supply, fill #0

## 2021-12-14 MED ORDER — ISOSORBIDE MONONITRATE ER 30 MG PO TB24
30.0000 mg | ORAL_TABLET | Freq: Every day | ORAL | 0 refills | Status: DC
  Filled 2021-12-14: qty 30, 30d supply, fill #0

## 2021-12-14 MED ORDER — AZITHROMYCIN 500 MG PO TABS
500.0000 mg | ORAL_TABLET | Freq: Every day | ORAL | 0 refills | Status: DC
  Filled 2021-12-14: qty 2, 2d supply, fill #0

## 2021-12-14 NOTE — Discharge Summary (Signed)
Jared [name] [id number] DOB: [birth year] DOA: 12/11/2021  PCP: Charlott Rakes, MD  Admit date: 12/11/2021  Discharge date: 12/14/2021  Admitted From: Home   Disposition:  Home   Recommendations for Outpatient Follow-up:   Follow up with PCP in 1-2 weeks  PCP Please obtain BMP/CBC, 2 view CXR in 1week,  (see Discharge instructions)   PCP Please follow up on the following pending results:    Home Health: PT,RN   Equipment/Devices: None  Consultations: None  Discharge Condition: Stable    CODE STATUS: Full    Diet Recommendation: Heart Healthy with 1.5 L fluid restriction.    Chief Complaint  Patient presents with   Shortness of Breath     Brief history of present illness from the day of admission and additional interim summary    66 y.o. male with medical history significant of COPD Gold stage III, chronic hypoxic respite failure on home O2 2 L as needed, CAD, chronic systolic CHF LVEF [id number] secondary to ischemic cardiomyopathy, PAF on Xarelto, cigarette smoker, chronic back pain, presented with increasing cough shortness of breath x 4-5 days, he was diagnosed with COPD exacerbation with acute on chronic hypoxic respiratory failure and admitted to the hospital.                                                                 Hospital Course   Acute on chronic hypoxic respiratory failure due to COPD exacerbation along with acute on chronic combined systolic and diastolic CHF last EF around 45% .  He scented with combination of COPD and CHF exacerbation, repeat echocardiogram was largely unchanged with EF still around 45% with diffuse hypokinesis, he had no chest pain, he was treated with steroids, oral azithromycin along with IV diuretics, he is now back to his baseline of 2 to 3 L nasal cannula oxygen at rest and  4 L nasal cannula oxygen upon ambulation.  He is symptom-free wants to go home.  Will be discharged on oral steroid taper, 2 more dose of oral azithromycin, Lasix dose has been doubled, I have added Coreg, hydralazine and Imdur combination to his home regimen.  Requested him to follow-up with his PCP and his primary cardiologist Dr. Harrington Challenger within a week of discharge.   Acute on chronic combined systolic and diastolic CHF with EF [id number].  see above.   Hypertension.  In poor control.  Placed on Coreg, hydralazine and Imdur combination along with increased dose Lasix.  PCP to monitor and adjust.  Paroxysmal atrial fibrillation [place] vas 2 score of greater than 4.  Continue Coreg along with Xarelto.   Ongoing cigarette smoking.  Counseled to quit.  NicoDerm patch provided.   Discharge diagnosis     Principal Problem:   COPD (chronic obstructive pulmonary disease) ([place]) Active Problems:  Acute on chronic respiratory failure with hypoxia and hypercapnia [facility])    Discharge instructions    Discharge Instructions     Discharge instructions   Complete by: As directed    Follow with Primary MD Charlott Rakes, MD in 7 days   Get CBC, CMP, 2 view Chest X ray -  checked next visit within 1 week by Primary MD   Activity: As tolerated with Full fall precautions use walker/cane & assistance as needed  Disposition Home   Diet: Heart Healthy strict 1.5 L fluid restriction per day  Check your Weight same time everyday, if you gain over 2 pounds, or you develop in leg swelling, experience more shortness of breath or chest pain, call your Primary MD immediately. Follow Cardiac Low Salt Diet and 1.5 lit/day fluid restriction.  Special Instructions: If you have smoked or chewed Tobacco  in the last 2 yrs please stop smoking, stop any regular Alcohol  and or any Recreational drug use.  On your next visit with your primary care physician please Get Medicines reviewed and adjusted.  Please request your  Prim.MD to go over all Hospital Tests and Procedure/Radiological results at the follow up, please get all Hospital records sent to your Prim MD by signing hospital release before you go home.  If you experience worsening of your admission symptoms, develop shortness of breath, life threatening emergency, suicidal or homicidal thoughts you must seek medical attention immediately by calling 911 or calling your MD immediately  if symptoms less severe.  You Must read complete instructions/literature along with all the possible adverse reactions/side effects for all the Medicines you take and that have been prescribed to you. Take any new Medicines after you have completely understood and accpet all the possible adverse reactions/side effects.   Increase activity slowly   Complete by: As directed        Discharge Medications   Allergies as of 12/14/2021       Reactions   Lisinopril Cough        Medication List     TAKE these medications    albuterol (2.5 MG/3ML) 0.083% nebulizer solution Commonly known as: PROVENTIL Take 3 mLs (2.5 mg total) by nebulization every 4 (four) hours as needed for wheezing or shortness of breath. What changed: Another medication with the same name was changed. Make sure you understand how and when to take each.   albuterol 108 (90 Base) MCG/ACT inhaler Commonly known as: VENTOLIN HFA INHALE 2 PUFFS INTO THE LUNGS EVERY 6 (SIX) HOURS AS NEEDED FOR WHEEZING OR SHORTNESS OF BREATH. What changed: when to take this   ALKA-SELTZER ORIGINAL PO Take 1 packet by mouth 2 (two) times daily as needed (bloat).   atorvastatin 80 MG tablet Commonly known as: LIPITOR Take 1 tablet (80 mg total) by mouth daily.   azithromycin 500 MG tablet Commonly known as: ZITHROMAX Take 1 tablet (500 mg total) by mouth daily. Start taking on: December 15, 2021   Breztri Aerosphere 160-9-4.8 MCG/ACT Aero Generic drug: Budeson-Glycopyrrol-Formoterol Take 2 puffs first thing in  morning and then another 2 puffs about 12 hours later. What changed:  how much to take how to take this when to take this additional instructions   carvedilol 3.125 MG tablet Commonly known as: COREG Take 1 tablet (3.125 mg total) by mouth 2 (two) times daily with a meal.   ferrous sulfate 325 (65 FE) MG tablet Take 1 tablet (325 mg total) by mouth 2 (two) times daily with a  meal.   fluticasone 50 MCG/ACT nasal spray Commonly known as: FLONASE Place 1 spray into both nostrils daily. Patient will need to make a follow up appointment for further refills.   furosemide 40 MG tablet Commonly known as: LASIX Take 1 tablet (40 mg total) by mouth 2 (two) times daily.   gabapentin 300 MG capsule Commonly known as: NEURONTIN TAKE 1 CAPSULE (300 MG TOTAL) BY MOUTH AT BEDTIME.   hydrALAZINE 50 MG tablet Commonly known as: APRESOLINE Take 1 tablet (50 mg total) by mouth every 8 (eight) hours.   isosorbide mononitrate 30 MG 24 hr tablet Commonly known as: IMDUR Take 1 tablet (30 mg total) by mouth daily. Start taking on: December 15, 2021   multivitamin with minerals Tabs tablet Take 1 tablet by mouth daily.   naproxen sodium 220 MG tablet Commonly known as: ALEVE Take 440 mg by mouth 2 (two) times daily as needed (headache).   nicotine 21 mg/24hr patch Commonly known as: NICODERM CQ - dosed in mg/24 hours Place 1 patch (21 mg total) onto the skin daily.   nitroGLYCERIN 0.4 MG SL tablet Commonly known as: NITROSTAT Place 1 tablet (0.4 mg total) under the tongue every 5 (five) minutes as needed for chest pain.   predniSONE 5 MG tablet Commonly known as: DELTASONE Label  & dispense according to the schedule below. take 8 Pills PO for 3 days, 6 Pills PO for 3 days, 4 Pills PO for 3 days, 2 Pills PO for 3 days, 1 Pills PO for 3 days, 1/2 Pill  PO for 3 days then STOP. Total 65 pills.   SALINE NA Place 2 sprays into the nose 2 (two) times daily as needed (dryness).   traMADol 50 MG  tablet Commonly known as: ULTRAM Take 1 tablet (50 mg total) by mouth every 6 (six) hours as needed for moderate pain.   triamcinolone cream 0.1 % Commonly known as: KENALOG Apply 1 application. topically 2 (two) times daily. What changed:  when to take this additional instructions   Xarelto 20 MG Tabs tablet Generic drug: rivaroxaban TAKE 1 TABLET EVERY DAY WITH SUPPER (MUST HAVE OFFICE VISIT FOR REFILLS) What changed: See the new instructions.         [facility] Follow up.   Specialty: Home Health Services Why: Home health arranged. They will contact you within 1 to 2 days of discharge to schedule apt Contact information: [address] [place] [address] [phone]         Charlott Rakes, MD. Schedule an appointment as soon as possible for a visit in 1 week(s).   Specialty: Family Medicine Contact information: [place] [address] [phone]         Fay Records, MD. Schedule an appointment as soon as possible for a visit in 1 week(s).   Specialty: Cardiology Contact information: [place] [address] [phone]                 Major procedures and Radiology Reports - PLEASE review detailed and final reports thoroughly  -       DG Chest Port 1 View  Result Date: 12/14/2021 CLINICAL DATA:  Shortness of breath.  History of COPD.  Smoker. EXAM: PORTABLE CHEST 1 VIEW COMPARISON:  12/12/2021 FINDINGS: Stable cardiac enlargement. Aortic atherosclerosis. Pulmonary vascular congestion. No pleural effusion, airspace consolidation or frank interstitial  edema. Remote left posterior rib fractures. IMPRESSION: Cardiac enlargement with pulmonary vascular congestion. Electronically Signed   By: Signa Kell M.D.   On: 12/14/2021 06:54   ECHOCARDIOGRAM COMPLETE  Result Date: 12/13/2021    ECHOCARDIOGRAM REPORT   Patient Name:    Jared [name] Date of Exam: 12/13/2021 Medical Rec #:  [id number]    Height:       75.0 in Accession #:    [id number]   Weight:       228.4 lb Date of Birth:  29-Nov-1955     BSA:          2.322 m Patient Age:    66 years     BP:           134/79 mmHg Patient Gender: M            HR:           73 bpm. Exam Location:  Inpatient Procedure: 2D Echo, Cardiac Doppler and Color Doppler Indications:    CHF  History:        Patient has prior history of Echocardiogram examinations. CHF,                 CAD, COPD; Risk Factors:Hypertension and Current Smoker.  Sonographer:    Cleatis Polka Referring Phys: Effie Shy Bryah Ocheltree K [facility] IMPRESSIONS  1. Left ventricular ejection fraction, by estimation, is 45 to 50%. The left ventricle has mildly decreased function. The left ventricle demonstrates global hypokinesis. There is mild concentric left ventricular hypertrophy. Left ventricular diastolic parameters are consistent with Grade I diastolic dysfunction (impaired relaxation).  2. Right ventricular systolic function is normal. The right ventricular size is normal.  3. The mitral valve is normal in structure. No evidence of mitral valve regurgitation. No evidence of mitral stenosis.  4. The aortic valve is normal in structure. Aortic valve regurgitation is not visualized. Aortic valve sclerosis/calcification is present, without any evidence of aortic stenosis.  5. The inferior vena cava is normal in size with greater than 50% respiratory variability, suggesting right atrial pressure of 3 mmHg. FINDINGS  Left Ventricle: Left ventricular ejection fraction, by estimation, is 45 to 50%. The left ventricle has mildly decreased function. The left ventricle demonstrates global hypokinesis. The left ventricular internal cavity size was normal in size. There is  mild concentric left ventricular hypertrophy. Left ventricular diastolic parameters are consistent with Grade I diastolic dysfunction (impaired relaxation). Right Ventricle: The right  ventricular size is normal. No increase in right ventricular wall thickness. Right ventricular systolic function is normal. Left Atrium: Left atrial size was normal in size. Right Atrium: Right atrial size was normal in size. Pericardium: There is no evidence of pericardial effusion. Mitral Valve: The mitral valve is normal in structure. Mild mitral annular calcification. No evidence of mitral valve regurgitation. No evidence of mitral valve stenosis. Tricuspid Valve: The tricuspid valve is normal in structure. Tricuspid valve regurgitation is not demonstrated. No evidence of tricuspid stenosis. Aortic Valve: The aortic valve is normal in structure. Aortic valve regurgitation is not visualized. Aortic valve sclerosis/calcification is present, without any evidence of aortic stenosis. Aortic valve peak gradient measures 6.7 mmHg. Pulmonic Valve: The pulmonic valve was normal in structure. Pulmonic valve regurgitation is not visualized. No evidence of pulmonic stenosis. Aorta: The aortic root is normal in size and structure. Venous: The inferior vena cava is normal in size with greater than 50% respiratory variability, suggesting right atrial pressure of 3 mmHg. IAS/Shunts: No atrial level shunt detected  by color flow Doppler.  LEFT VENTRICLE PLAX 2D LVIDd:         5.80 cm      Diastology LVIDs:         4.40 cm      LV e' medial:    5.35 cm/s LV PW:         1.30 cm      LV E/e' medial:  12.3 LV IVS:        1.10 cm      LV e' lateral:   8.24 cm/s LVOT diam:     2.00 cm      LV E/e' lateral: 8.0 LV SV:         65 LV SV Index:   28 LVOT Area:     3.14 cm  LV Volumes (MOD) LV vol d, MOD A2C: 169.0 ml LV vol d, MOD A4C: 122.0 ml LV vol s, MOD A2C: 87.2 ml LV vol s, MOD A4C: 63.0 ml LV SV MOD A2C:     81.8 ml LV SV MOD A4C:     122.0 ml LV SV MOD BP:      72.3 ml RIGHT VENTRICLE             IVC RV Basal diam:  3.60 cm     IVC diam: 1.40 cm RV Mid diam:    3.40 cm RV S prime:     10.40 cm/s TAPSE (M-mode): 1.9 cm LEFT ATRIUM              Index        RIGHT ATRIUM           Index LA diam:        4.10 cm 1.77 cm/m   RA Area:     25.20 cm LA Vol (A2C):   78.0 ml 33.60 ml/m  RA Volume:   78.90 ml  33.98 ml/m LA Vol (A4C):   47.5 ml 20.46 ml/m LA Biplane Vol: 65.1 ml 28.04 ml/m  AORTIC VALVE AV Area (Vmax): 2.68 cm AV Vmax:        129.00 cm/s AV Peak Grad:   6.7 mmHg LVOT Vmax:      110.00 cm/s LVOT Vmean:     78.200 cm/s LVOT VTI:       0.206 m  AORTA Ao Root diam: 3.40 cm Ao Asc diam:  3.50 cm MITRAL VALVE MV Area (PHT): 4.10 cm    SHUNTS MV Decel Time: 185 msec    Systemic VTI:  0.21 m MV E velocity: 66.00 cm/s  Systemic Diam: 2.00 cm MV A velocity: 67.70 cm/s MV E/A ratio:  0.97 Kardie Tobb DO Electronically signed by Thomasene Ripple DO Signature Date/Time: 12/13/2021/1:52:14 PM    Final    DG Chest Port 1 View  Result Date: 12/12/2021 CLINICAL DATA:  Shortness of breath.  History of COPD. EXAM: PORTABLE CHEST 1 VIEW COMPARISON:  12/11/2021 FINDINGS: Unchanged cardiac enlargement. New pulmonary vascular congestion. No pleural effusion or frank interstitial edema. No airspace opacity. Remote healed left posterior rib fracture deformities identified. IMPRESSION: 1. Stable cardiac enlargement. 2. New pulmonary vascular congestion. Electronically Signed   By: Signa Kell M.D.   On: 12/12/2021 07:38   DG Abd 2 Views  Result Date: 12/12/2021 CLINICAL DATA:  Abdominal pain EXAM: ABDOMEN - 2 VIEW COMPARISON:  None Available. FINDINGS: Scattered large and small bowel gas is noted. No obstructive changes are seen. No free air is noted. No mass lesion or abnormal calcifications  are seen. Endoscopic clip is noted in the stomach. IMPRESSION: No evidence of obstruction.  No acute abnormality noted. Electronically Signed   By: Inez Catalina M.D.   On: 12/12/2021 00:22   DG Chest 2 View  Result Date: 12/11/2021 CLINICAL DATA:  66 year old male with shortness of breath since yesterday. EXAM: CHEST - 2 VIEW COMPARISON:  Chest CT 07/12/2021 and  earlier. FINDINGS: AP and lateral views at 0758 hours. Cardiac size seems increased since February. Mild cardiomegaly now. Other mediastinal contours are within normal limits. Visualized tracheal air column is within normal limits. Centrilobular emphysema depicted by CT this year. Pulmonary vascularity appears stable from radiographs last year. No acute pulmonary edema. No pleural effusion or consolidation. No acute osseous abnormality identified. Negative visible bowel gas. IMPRESSION: Emphysema (ICD10-J43.9), and questionable cardiomegaly. No acute cardiopulmonary abnormality. Electronically Signed   By: Genevie Ann M.D.   On: 12/11/2021 08:42      Today   Subjective    Jared [name] today has no headache,no chest abdominal pain,no new weakness tingling or numbness, feels much better wants to go home today.     Objective   Blood pressure (!) 147/93, pulse 78, temperature 98.1 F (36.7 C), temperature source Oral, resp. rate 20, height 6\' 3"  (1.905 m), weight 103.6 kg, SpO2 94 %.   Intake/Output Summary (Last 24 hours) at 12/14/2021 1116 Last data filed at 12/13/2021 1648 Gross per 24 hour  Intake 240 ml  Output 700 ml  Net -460 ml    Exam  Awake Alert, No new F.N deficits,    [place].AT,PERRAL Supple Neck,   Symmetrical Chest wall movement, Good air movement bilaterally, CTAB RRR,No Gallops,   +ve B.Sounds, Abd Soft, Non tender,  No Cyanosis, Clubbing or edema    Data Review   Recent Labs  Lab 12/11/21 0750 12/11/21 0903 12/12/21 0100 12/13/21 0133 12/14/21 0218  WBC 6.9  --  9.5 15.0* 13.7*  HGB 15.1 14.6 15.1 14.5 15.0  HCT 51.9 43.0 49.2 48.9 50.7  PLT 176  --  168 167 164  MCV 92.5  --  90.9 91.9 91.8  MCH 26.9  --  27.9 27.3 27.2  MCHC 29.1*  --  30.7 29.7* 29.6*  RDW 14.4  --  14.3 14.2 14.0  LYMPHSABS 2.6  --   --  0.8 0.9  MONOABS 0.6  --   --  0.4 0.3  EOSABS 0.1  --   --  0.0 0.0  BASOSABS 0.0  --   --  0.0 0.0    Recent Labs  Lab 12/11/21 0750 12/11/21 0903  12/12/21 0623 12/13/21 0133 12/14/21 0218  NA 142 145 142 137 139  K 5.0 3.9 5.0 5.1 4.5  CL 104  --  102 98 97*  CO2 32  --  32 35* 36*  GLUCOSE 100*  --  167* 190* 144*  BUN 15  --  17 24* 26*  CREATININE 1.06  --  0.91 1.10 1.12  CALCIUM 8.5*  --  8.9 8.8* 9.1  AST 31  --   --  23 25  ALT 15  --   --  17 18  ALKPHOS 47  --   --  47 46  BILITOT 0.6  --   --  0.5 0.7  ALBUMIN 3.7  --   --  3.5 3.6  MG  --   --  2.3 2.4 2.4  CRP  --   --  0.6  --   --  BNP 447.3*  --  252.6*  --   --     Total Time in preparing paper work, data evaluation and todays exam - 13 minutes  Lala Lund M.D on 12/14/2021 at 11:16 AM  [facility]

## 2021-12-14 NOTE — TOC Transition Note (Signed)
Transition of Care [facility]) - CM/SW Discharge Note   Patient Details  Name: Jared [name] MRN: [id number] Date of Birth: Nov 07, 1955  [facility] CM/SW Contact:  Harriet Masson, RN Phone Number: 12/14/2021, 1:38 PM   Clinical Narrative:     Patient stable for discharge. [name] with [facility] notified of discharge.  No other TOC needs at this time.    Barriers to Discharge: Continued Medical Work up   Patient Goals and CMS Choice Patient states their goals for this hospitalization and ongoing recovery are:: return home CMS Medicare.gov Compare Post Acute Care list provided to:: Patient Choice offered to / list presented to : Patient  Discharge Placement             home          Discharge Plan and Services   Discharge Planning Services: CM Consult Post Acute Care Choice: Home Health                    HH Arranged: PT [facility] Agency: [facility] Date [facility] Agency Contacted: 12/13/21 Time HH Agency Contacted: 1408 Representative spoke with at [facility] Agency: [name]  Social Determinants of Health (SDOH) Interventions     Readmission Risk Interventions    12/14/2021    1:38 PM 02/01/2021    3:22 PM  Readmission Risk Prevention Plan  Post Dischage Appt Complete   Medication Screening Complete   Transportation Screening Complete Complete  PCP or Specialist Appt within 5-7 Days  Complete  Home Care Screening  Complete  Medication Review (RN CM)  Complete

## 2021-12-14 NOTE — Progress Notes (Signed)
Patient is alert and oriented. No complaint of pain. Patient is SR on the monitor. Blood pressure has been stable. Patient currently on 2L of O2. Lungs are diminished bilaterally. Bowel sounds are active. No BM so far during this shift. Patient is voiding without any issues. Patient is getting discharged today. Currently waiting on family to pick him up.

## 2021-12-14 NOTE — Consult Note (Signed)
   [facility] CM Inpatient Consult   12/14/2021  Zion Ta Apr 26, 1956 [id number]   [facility] [[facility]]  Accountable Care Organization [ACO] Patient: [employer] Medicare SNP   Review of patient's medical record for past medical history and membership affiliate roster reveals this patient is a [employer] Needs Program] member and will be followed with the [facility] Medicare assigned team member in that program.  Of note, [facility] Care Management services does not replace or interfere with any services that are arranged by inpatient case management or social work.  For additional questions or referrals please contact:    Plan: Will sign off.    Charlesetta Shanks, RN BSN CCM [facility]  [phone] business mobile phone Toll free office [phone]  Fax number: [phone] [email or web address]

## 2021-12-14 NOTE — Discharge Instructions (Signed)
Follow with Primary MD [name], MD in 7 days   Get CBC, CMP, 2 view Chest X ray -  checked next visit within 1 week by Primary MD   Activity: As tolerated with Full fall precautions use walker/cane & assistance as needed  Disposition Home   Diet: Heart Healthy strict 1.5 L fluid restriction per day  Check your Weight same time everyday, if you gain over 2 pounds, or you develop in leg swelling, experience more shortness of breath or chest pain, call your Primary MD immediately. Follow Cardiac Low Salt Diet and 1.5 lit/day fluid restriction.  Special Instructions: If you have smoked or chewed Tobacco  in the last 2 yrs please stop smoking, stop any regular Alcohol  and or any Recreational drug use.  On your next visit with your primary care physician please Get Medicines reviewed and adjusted.  Please request your Prim.MD to go over all Hospital Tests and Procedure/Radiological results at the follow up, please get all Hospital records sent to your Prim MD by signing hospital release before you go home.  If you experience worsening of your admission symptoms, develop shortness of breath, life threatening emergency, suicidal or homicidal thoughts you must seek medical attention immediately by calling 911 or calling your MD immediately  if symptoms less severe.  You Must read complete instructions/literature along with all the possible adverse reactions/side effects for all the Medicines you take and that have been prescribed to you. Take any new Medicines after you have completely understood and accpet all the possible adverse reactions/side effects.

## 2021-12-14 NOTE — Care Management Important Message (Signed)
Important Message  Patient Details  Name: Jared [name] MRN: [id number] Date of Birth: 02-May-1956   Medicare Important Message Given:  Yes     Javian Nudd Stefan Church 12/14/2021, 3:36 PM

## 2021-12-18 ENCOUNTER — Telehealth: Payer: Self-pay

## 2021-12-18 ENCOUNTER — Encounter: Payer: Self-pay | Admitting: Family Medicine

## 2021-12-18 ENCOUNTER — Ambulatory Visit: Payer: Medicare HMO | Attending: Family Medicine | Admitting: Family Medicine

## 2021-12-18 ENCOUNTER — Other Ambulatory Visit: Payer: Self-pay

## 2021-12-18 VITALS — BP 121/77 | HR 88 | Temp 98.1°F | Ht 75.0 in | Wt 230.6 lb

## 2021-12-18 DIAGNOSIS — R6881 Early satiety: Secondary | ICD-10-CM

## 2021-12-18 DIAGNOSIS — I5042 Chronic combined systolic (congestive) and diastolic (congestive) heart failure: Secondary | ICD-10-CM

## 2021-12-18 DIAGNOSIS — E1169 Type 2 diabetes mellitus with other specified complication: Secondary | ICD-10-CM | POA: Diagnosis not present

## 2021-12-18 DIAGNOSIS — R140 Abdominal distension (gaseous): Secondary | ICD-10-CM | POA: Diagnosis not present

## 2021-12-18 DIAGNOSIS — I110 Hypertensive heart disease with heart failure: Secondary | ICD-10-CM

## 2021-12-18 DIAGNOSIS — J449 Chronic obstructive pulmonary disease, unspecified: Secondary | ICD-10-CM

## 2021-12-18 LAB — POCT GLYCOSYLATED HEMOGLOBIN (HGB A1C): HbA1c, POC (controlled diabetic range): 7 % (ref 0.0–7.0)

## 2021-12-18 MED ORDER — ISOSORBIDE MONONITRATE ER 30 MG PO TB24
30.0000 mg | ORAL_TABLET | Freq: Every day | ORAL | 1 refills | Status: DC
  Filled 2021-12-18 – 2022-01-12 (×2): qty 90, 90d supply, fill #0
  Filled 2022-04-10: qty 90, 90d supply, fill #1

## 2021-12-18 MED ORDER — ACCU-CHEK SOFTCLIX LANCETS MISC
12 refills | Status: DC
  Filled 2021-12-18: qty 100, 30d supply, fill #0
  Filled 2022-01-04 – 2022-01-10 (×5): qty 100, 30d supply, fill #1
  Filled 2022-04-10: qty 100, 30d supply, fill #2
  Filled 2022-06-12: qty 100, 30d supply, fill #3
  Filled 2022-09-05: qty 100, 30d supply, fill #4

## 2021-12-18 MED ORDER — ALBUTEROL SULFATE (2.5 MG/3ML) 0.083% IN NEBU
2.5000 mg | INHALATION_SOLUTION | Freq: Four times a day (QID) | RESPIRATORY_TRACT | 3 refills | Status: DC | PRN
  Filled 2021-12-18: qty 75, 7d supply, fill #0
  Filled 2022-04-10: qty 75, 7d supply, fill #1
  Filled 2022-04-26: qty 90, 8d supply, fill #1

## 2021-12-18 MED ORDER — ACCU-CHEK GUIDE W/DEVICE KIT
PACK | 0 refills | Status: DC
  Filled 2021-12-18: qty 1, 30d supply, fill #0

## 2021-12-18 MED ORDER — METFORMIN HCL 500 MG PO TABS
500.0000 mg | ORAL_TABLET | Freq: Every day | ORAL | 1 refills | Status: DC
  Filled 2021-12-18: qty 90, 90d supply, fill #0
  Filled 2022-03-22: qty 90, 90d supply, fill #1

## 2021-12-18 MED ORDER — ACCU-CHEK GUIDE VI STRP
ORAL_STRIP | 12 refills | Status: DC
  Filled 2021-12-18: qty 100, 30d supply, fill #0
  Filled 2022-01-12: qty 100, 30d supply, fill #1
  Filled 2022-04-10: qty 100, 30d supply, fill #2
  Filled 2022-05-11: qty 100, 30d supply, fill #3
  Filled 2022-06-17: qty 100, 30d supply, fill #4
  Filled 2022-09-05: qty 100, 30d supply, fill #5

## 2021-12-18 MED ORDER — FUROSEMIDE 40 MG PO TABS
40.0000 mg | ORAL_TABLET | Freq: Two times a day (BID) | ORAL | 2 refills | Status: DC
  Filled 2021-12-18 – 2022-01-12 (×2): qty 60, 30d supply, fill #0
  Filled 2022-04-10: qty 60, 30d supply, fill #1
  Filled 2022-05-11: qty 60, 30d supply, fill #2

## 2021-12-18 MED ORDER — HYDRALAZINE HCL 50 MG PO TABS
50.0000 mg | ORAL_TABLET | Freq: Three times a day (TID) | ORAL | 1 refills | Status: DC
  Filled 2021-12-18 – 2022-01-12 (×2): qty 270, 90d supply, fill #0
  Filled 2022-04-10: qty 270, 90d supply, fill #1

## 2021-12-18 MED ORDER — CARVEDILOL 3.125 MG PO TABS
3.1250 mg | ORAL_TABLET | Freq: Two times a day (BID) | ORAL | 1 refills | Status: DC
  Filled 2021-12-18 – 2022-01-12 (×2): qty 180, 90d supply, fill #0
  Filled 2022-04-10: qty 180, 90d supply, fill #1

## 2021-12-18 NOTE — Progress Notes (Signed)
HFU Abdominal swelling after eating referral to GI Medication refills

## 2021-12-18 NOTE — Telephone Encounter (Signed)
Transition Care Management Follow-up Telephone Call Date of discharge and from where: 12/14/2021, [facility] Patient at appointment with Dr Alvis Lemmings today

## 2021-12-18 NOTE — Patient Instructions (Signed)

## 2021-12-18 NOTE — Progress Notes (Signed)
Subjective:  Patient ID: Jared [name], male    DOB: January 22, 1956  Age: 66 y.o. MRN: [id number]  CC: Prediabetes   HPI Jared [name] is a 66 y.o. 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 He presents today for transition of care follow-up. He was hospitalized 12/11/2021 through 12/14/2021 for COPD and CHF exacerbation. CT chest revealed: IMPRESSION: Emphysema (ICD10-J43.9), and questionable cardiomegaly. No acute cardiopulmonary abnormality.  Echocardiogram revealed: IMPRESSIONS     1. Left ventricular ejection fraction, by estimation, is 45 to 50%. The  left ventricle has mildly decreased function. The left ventricle  demonstrates global hypokinesis. There is mild concentric left ventricular  hypertrophy. Left ventricular diastolic  parameters are consistent with Grade I diastolic dysfunction (impaired  relaxation).   2. Right ventricular systolic function is normal. The right ventricular  size is normal.   3. The mitral valve is normal in structure. No evidence of mitral valve  regurgitation. No evidence of mitral stenosis.   4. The aortic valve is normal in structure. Aortic valve regurgitation is  not visualized. Aortic valve sclerosis/calcification is present, without  any evidence of aortic stenosis.   5. The inferior vena cava is normal in size with greater than 50%  respiratory variability, suggesting right atrial pressure of 3 mmHg.   He was treated with antibiotics, IV diuretics and subsequently discharged on oral prednisone.  Interval History: He reports doing well and breathing is back to baseline.  Currently still on a prednisone taper which he was discharged on. Pulmonary appointment comes  up in 2 weeks and Cardiology in 1 month.  He is compliant with his antihypertensive. He complains of abdominal distention which affects his breathing. He feels like his stomach is full of air and he does have early satiety. Endorses burping a lot and excessive flatulence. Denies presence of dyspepsia or reflux, denies presence of abdominal pain.  Point-of-care test today reveal a new diagnosis of type 2 diabetes mellitus with an A1c of 7.0 Past Medical History:  Diagnosis Date  . AVM (arteriovenous malformation)   . CAD (coronary artery disease)    a. LHC 5/12:  LAD 20, pLCx 20, pRCA 40, dRCA 40, EF 35%, diff HK  //  b. Myoview 4/16: Overall Impression:  High risk stress nuclear study There is no evidence of ischemia.  There is severe LV dysfunction. LV Ejection Fraction: 30%.  LV Wall Motion:  There is global LV hypokinesis.    Marland Kitchen CAP (community acquired pneumonia) 09/2013  . Chronic combined systolic and diastolic CHF (congestive heart failure) (HCC)    a. Echo 4/16:Mild LVH, EF 40-45%, diffuse HK //  b. Echo 8/17: EF 35-40%, diffuse HK, diastolic dysfunction, aortic sclerosis, trivial MR, moderate LAE, normal RVSF, moderate RAE, mild TR, PASP 42 mmHg // c. Echo 4/18: Mild concentric LVH, EF 30-35, normal wall motion, grade 1 diastolic dysfunction, PASP 49  . Chronic respiratory failure ([place])   . Cluster headache    "hx; haven't had one in awhile" (01/09/2016)  . COPD (chronic obstructive pulmonary disease) ([place])    [name] 01/09/2016  . History of CVA (cerebrovascular accident)   . Hypertension   . IDA (iron deficiency anemia)   . Moderate tobacco use disorder   . NICM (nonischemic  cardiomyopathy) ([place])   . Nicotine addiction   . Tobacco abuse     Past Surgical History:  Procedure Laterality Date  . BIOPSY  11/12/2020   Procedure: BIOPSY;  Surgeon: Irving Copas., MD;  Location: [facility];  Service: Gastroenterology;;  . CARDIAC CATHETERIZATION  10/2010   LM normal, LAD with  20% irregularities, LCX with 20%, RCA with 40% prox and 40% distal - EF of 35%  . CATARACT EXTRACTION, BILATERAL    . COLONOSCOPY W/ BIOPSIES AND POLYPECTOMY    . COLONOSCOPY WITH PROPOFOL N/A 09/06/2018   Procedure: COLONOSCOPY WITH PROPOFOL;  Surgeon: Thornton Park, MD;  Location: [place];  Service: Gastroenterology;  Laterality: N/A;  . ENTEROSCOPY N/A 09/28/2018   Procedure: ENTEROSCOPY;  Surgeon: Carol Ada, MD;  Location: [facility]OSCOPY;  Service: Endoscopy;  Laterality: N/A;  . ENTEROSCOPY N/A 10/28/2018   Procedure: ENTEROSCOPY;  Surgeon: Thornton Park, MD;  Location: [place];  Service: Gastroenterology;  Laterality: N/A;  . ENTEROSCOPY N/A 10/09/2020   Procedure: ENTEROSCOPY;  Surgeon: Irene Shipper, MD;  Location: [facility] ENDOSCOPY;  Service: Endoscopy;  Laterality: N/A;  . ESOPHAGOGASTRODUODENOSCOPY N/A 11/12/2020   Procedure: ESOPHAGOGASTRODUODENOSCOPY (EGD);  Surgeon: Irving Copas., MD;  Location: [facility];  Service: Gastroenterology;  Laterality: N/A;  . ESOPHAGOGASTRODUODENOSCOPY (EGD) WITH PROPOFOL N/A 09/05/2018   Procedure: ESOPHAGOGASTRODUODENOSCOPY (EGD) WITH PROPOFOL;  Surgeon: Yetta Flock, MD;  Location: [place];  Service: Gastroenterology;  Laterality: N/A;  . ESOPHAGOGASTRODUODENOSCOPY (EGD) WITH PROPOFOL N/A 11/19/2020   Procedure: ESOPHAGOGASTRODUODENOSCOPY (EGD) WITH PROPOFOL;  Surgeon: Jerene Bears, MD;  [facility]: [facility];  Service: Gastroenterology;  Laterality: N/A;  . EXCISION MASS HEAD    . GIVENS CAPSULE STUDY N/A 09/06/2018   Procedure: GIVENS CAPSULE STUDY;  Surgeon: Thornton Park, MD;  Location: [place];  Service: Gastroenterology;  Laterality: N/A;  . GIVENS CAPSULE STUDY N/A 09/26/2018   Procedure: GIVENS CAPSULE STUDY;  Surgeon: Jerene Bears, MD;  Location: [place];  Service: Gastroenterology;  Laterality: N/A;  . GIVENS CAPSULE STUDY N/A 06/14/2019   Procedure: GIVENS CAPSULE STUDY;  Surgeon: Mauri Pole, MD;  Location: [place];  Service: Endoscopy;  Laterality: N/A;  . HEMOSTASIS CLIP PLACEMENT  11/12/2020   Procedure: HEMOSTASIS CLIP PLACEMENT;  Surgeon: Irving Copas., MD;  Location: [facility];  Service: Gastroenterology;;  . Laretta Alstrom CONTROL  11/12/2020   Procedure: HEMOSTASIS CONTROL;  Surgeon: Irving Copas., MD;  Location: [facility];  Service: Gastroenterology;;  . HOT HEMOSTASIS N/A 10/28/2018   Procedure: HOT HEMOSTASIS (ARGON PLASMA COAGULATION/BICAP);  Surgeon: Thornton Park, MD;  Location: [place];  Service: Gastroenterology;  Laterality: N/A;  . HOT HEMOSTASIS N/A 11/12/2020   Procedure: HOT HEMOSTASIS (ARGON PLASMA COAGULATION/BICAP);  Surgeon: Irving Copas., MD;  Location: [facility];  Service: Gastroenterology;  Laterality: N/A;  . INCISION AND DRAINAGE PERIRECTAL ABSCESS N/A 06/05/2017   Procedure: IRRIGATION AND DEBRIDEMENT PERIRECTAL ABSCESS;  Surgeon: Ileana Roup, MD;  Location: [place];  Service: General;  Laterality: N/A;  . SUBMUCOSAL TATTOO INJECTION  11/12/2020   Procedure: SUBMUCOSAL TATTOO INJECTION;  Surgeon: Irving Copas., MD;  Location: [facility];  Service: Gastroenterology;;  . VIDEO BRONCHOSCOPY Bilateral 05/08/2016   Procedure: VIDEO BRONCHOSCOPY WITH FLUORO;  Surgeon: Rigoberto Noel, MD;  Location: [place];  Service: Cardiopulmonary;  Laterality: Bilateral;    Family History  Problem Relation Age of Onset  . Heart disease Mother   . Diabetes Mother   . Colon cancer Mother   .  Liver cancer Mother   . Cancer Father        type unknown  . Diabetes Sister        x 2  . Diabetes Brother     Social History   Socioeconomic History  . Marital status: Single    Spouse name: Not on file  . Number of children: 1  . Years of education: Not on file  . Highest education level: Not on file  Occupational History  . Occupation: retired  Tobacco Use  . Smoking status: Every Day     Packs/day: 0.50    Years: 47.00    Total pack years: 23.50    Types: Cigarettes  . Smokeless tobacco: Never  . Tobacco comments:    4/5 cigs  Vaping Use  . Vaping Use: Never used  Substance and Sexual Activity  . Alcohol use: Not Currently    Alcohol/week: 0.0 standard drinks of alcohol    Comment: last drink was before xmas  . Drug use: No    Types: Cocaine, Marijuana    Comment: "nothing in 20 years"  . Sexual activity: Not Currently  Other Topics Concern  . Not on file  Social History Narrative   unemployed   Social Determinants of Health   Financial Resource Strain: Low Risk  (01/06/2021)   Overall Financial Resource Strain (CARDIA)   . Difficulty of Paying Living Expenses: Not very hard  Food Insecurity: No Food Insecurity (01/06/2021)   Hunger Vital Sign   . Worried About Charity fundraiser in the Last Year: Never true   . Ran Out of Food in the Last Year: Never true  Transportation Needs: No Transportation Needs (01/06/2021)   PRAPARE - Transportation   . Lack of Transportation (Medical): No   . Lack of Transportation (Non-Medical): No  Physical Activity: Inactive (01/06/2021)   Exercise Vital Sign   . Days of Exercise per Week: 0 days   . Minutes of Exercise per Session: 0 min  Stress: No Stress Concern Present (01/06/2021)   [place]   . Feeling of Stress : Not at all  Social Connections: Moderately Integrated (01/06/2021)   Social Connection and Isolation Panel [NHANES]   . Frequency of Communication with Friends and Family: More than three times a week   . Frequency of Social Gatherings with Friends and Family: Once a week   . Attends Religious Services: More than 4 times per year   . Active Member of Clubs or Organizations: Yes   . Attends Archivist Meetings: More than 4 times per year   . Marital Status: Divorced    Allergies  Allergen Reactions  . Lisinopril Cough     Outpatient Medications Prior to Visit  Medication Sig Dispense Refill  . albuterol (VENTOLIN HFA) 108 (90 Base) MCG/ACT inhaler INHALE 2 PUFFS INTO THE LUNGS EVERY 6 (SIX) HOURS AS NEEDED FOR WHEEZING OR SHORTNESS OF BREATH. (Patient taking differently: Inhale 2 puffs into the lungs 3 (three) times daily as needed for wheezing or shortness of breath.) 18 g 5  . Aspirin Effervescent (ALKA-SELTZER ORIGINAL PO) Take 1 packet by mouth 2 (two) times daily as needed (bloat).    Marland Kitchen atorvastatin (LIPITOR) 80 MG tablet Take 1 tablet (80 mg total) by mouth daily. 90 tablet 0  . azithromycin (ZITHROMAX) 500 MG tablet Take 1 tablet (500 mg total) by mouth daily. 2 tablet 0  . Budeson-Glycopyrrol-Formoterol (BREZTRI AEROSPHERE) 160-9-4.8 MCG/ACT AERO  Take 2 puffs first thing in morning and then another 2 puffs about 12 hours later. (Patient taking differently: Inhale 2 puffs into the lungs in the morning and at bedtime.) 10.7 g 6  . gabapentin (NEURONTIN) 300 MG capsule TAKE 1 CAPSULE (300 MG TOTAL) BY MOUTH AT BEDTIME. 30 capsule 2  . naproxen sodium (ALEVE) 220 MG tablet Take 440 mg by mouth 2 (two) times daily as needed (headache).    . SALINE NA Place 2 sprays into the nose 2 (two) times daily as needed (dryness).    . traMADol (ULTRAM) 50 MG tablet Take 1 tablet (50 mg total) by mouth every 6 (six) hours as needed for moderate pain. 30 tablet 0  . triamcinolone cream (KENALOG) 0.1 % Apply 1 application. topically 2 (two) times daily. (Patient taking differently: Apply 1 application  topically See admin instructions. 1 application 1-2 times a day as needed for rash on elbow) 45 g 1  . XARELTO 20 MG TABS tablet TAKE 1 TABLET EVERY DAY WITH SUPPER (MUST HAVE OFFICE VISIT FOR REFILLS) (Patient taking differently: Take 20 mg by mouth daily with supper.) 90 tablet 0  . albuterol (PROVENTIL) (2.5 MG/3ML) 0.083% nebulizer solution Take 3 mLs (2.5 mg total) by nebulization every 4 (four) hours as needed for  wheezing or shortness of breath. 75 mL 0  . carvedilol (COREG) 3.125 MG tablet Take 1 tablet (3.125 mg total) by mouth 2 (two) times daily with a meal. 60 tablet 0  . furosemide (LASIX) 40 MG tablet Take 1 tablet (40 mg total) by mouth 2 (two) times daily. 60 tablet 0  . hydrALAZINE (APRESOLINE) 50 MG tablet Take 1 tablet (50 mg total) by mouth every 8 (eight) hours. 90 tablet 0  . isosorbide mononitrate (IMDUR) 30 MG 24 hr tablet Take 1 tablet (30 mg total) by mouth daily. 30 tablet 0  . ferrous sulfate 325 (65 FE) MG tablet Take 1 tablet (325 mg total) by mouth 2 (two) times daily with a meal. (Patient not taking: Reported on 12/11/2021) 60 tablet 1  . fluticasone (FLONASE) 50 MCG/ACT nasal spray Place 1 spray into both nostrils daily. Patient will need to make a follow up appointment for further refills. (Patient not taking: Reported on 12/11/2021) 16 g 1  . Multiple Vitamin (MULTIVITAMIN WITH MINERALS) TABS tablet Take 1 tablet by mouth daily. (Patient not taking: Reported on 12/11/2021)    . nicotine (NICODERM CQ - DOSED IN MG/24 HOURS) 21 mg/24hr patch Place 1 patch (21 mg total) onto the skin daily. (Patient not taking: Reported on 12/18/2021) 30 patch 0  . nitroGLYCERIN (NITROSTAT) 0.4 MG SL tablet Place 1 tablet (0.4 mg total) under the tongue every 5 (five) minutes as needed for chest pain.    . predniSONE (DELTASONE) 5 MG tablet Take 8 tabs by mouth for 3 days, 6 tabs by mouth for 3 days, 4 tabs by mouth for 3 days, 2 tabs by mouth for 3 days, 1 tabs by mouth for 3 days, 1/2 tabs by mouth for 3 days then STOP. Total 65 pills. (Patient not taking: Reported on 12/18/2021) 65 tablet 0   No facility-administered medications prior to visit.     ROS Review of Systems  Constitutional:  Negative for activity change and appetite change.  HENT:  Negative for sinus pressure and sore throat.   Respiratory:  Negative for chest tightness, shortness of breath and wheezing.   Cardiovascular:  Negative for  chest pain and palpitations.  Gastrointestinal:  Positive for abdominal distention. Negative for abdominal pain and constipation.  Genitourinary: Negative.   Musculoskeletal: Negative.   Psychiatric/Behavioral:  Negative for behavioral problems and dysphoric mood.     Objective:  BP 121/77   Pulse 88   Temp 98.1 F (36.7 C) (Oral)   Ht _0  (1.905 m)   Wt 230 lb 9.6 oz (104.6 kg)   SpO2 97%   BMI 28.82 kg/m      12/18/2021   10:34 AM 12/14/2021    7:31 AM 12/14/2021    6:29 AM  BP/Weight  Systolic BP 726 203 559  Diastolic BP 77 93 96  Wt. (Lbs) 230.6    BMI 28.82 kg/m2        Physical Exam Constitutional:      Appearance: He is well-developed.  HENT:     Nose:     Comments: On 2 L oxygen via nasal cannula Cardiovascular:     Rate and Rhythm: Normal rate.     Heart sounds: Normal heart sounds. No murmur heard. Pulmonary:     Effort: Pulmonary effort is normal.     Breath sounds: Normal breath sounds. No wheezing or rales.  Chest:     Chest wall: No tenderness.  Abdominal:     General: Bowel sounds are normal. There is distension.     Palpations: Abdomen is soft. There is no mass.     Tenderness: There is no abdominal tenderness.  Musculoskeletal:        General: Normal range of motion.     Right lower leg: No edema.     Left lower leg: No edema.  Neurological:     Mental Status: He is alert and oriented to person, place, and time.  Psychiatric:        Mood and Affect: Mood normal.       Latest Ref Rng & Units 12/14/2021    2:18 AM 12/13/2021    1:33 AM 12/12/2021    6:23 AM  CMP  Glucose 70 - 99 mg/dL 144  190  167   BUN 8 - 23 mg/dL _1 Creatinine 0.61 - 1.24 mg/dL 1.12  1.10  0.91   Sodium 135 - 145 mmol/L 139  137  142   Potassium 3.5 - 5.1 mmol/L 4.5  5.1  5.0   Chloride 98 - 111 mmol/L 97  98  102   CO2 22 - 32 mmol/L 36  35  32   Calcium 8.9 - 10.3 mg/dL 9.1  8.8  8.9   Total Protein 6.5 - 8.1 g/dL 6.3  6.0    Total Bilirubin 0.3 - 1.2  mg/dL 0.7  0.5    Alkaline Phos 38 - 126 U/L 46  47    AST 15 - 41 U/L 25  23    ALT 0 - 44 U/L 18  17      Lipid Panel     Component Value Date/Time   CHOL 148 01/29/2020 1020   TRIG 138 01/29/2020 1020   HDL 51 01/29/2020 1020   CHOLHDL 2.9 01/29/2020 1020   CHOLHDL 2.8 01/11/2016 0446   VLDL 14 01/11/2016 0446   LDLCALC 73 01/29/2020 1020    CBC    Component Value Date/Time   WBC 13.7 (H) 12/14/2021 0218   RBC 5.52 12/14/2021 0218   HGB 15.0 12/14/2021 0218   HGB 17.1 09/20/2021 1620   HCT 50.7 12/14/2021 0218   HCT 53.6 (H) 09/20/2021 1620  PLT 164 12/14/2021 0218   PLT 236 09/20/2021 1620   MCV 91.8 12/14/2021 0218   MCV 88 09/20/2021 1620   MCH 27.2 12/14/2021 0218   MCHC 29.6 (L) 12/14/2021 0218   RDW 14.0 12/14/2021 0218   RDW 12.8 09/20/2021 1620   LYMPHSABS 0.9 12/14/2021 0218   LYMPHSABS 3.0 09/20/2021 1620   MONOABS 0.3 12/14/2021 0218   EOSABS 0.0 12/14/2021 0218   EOSABS 0.1 09/20/2021 1620   BASOSABS 0.0 12/14/2021 0218   BASOSABS 0.1 09/20/2021 1620    Lab Results  Component Value Date   HGBA1C 7.0 12/18/2021    Assessment & Plan:  1. Type 2 diabetes mellitus with other specified complication, without long-term current use of insulin (HCC) New diagnosis with A1c of 7.0 Counseled on pathophysiology of type 2 diabetes mellitus Would love to place on GLP-1 RA however due to GI symptoms I will hold off Clinical pharmacist has provided him with additional education on nutrition, glucometer use Counseled on Diabetic diet, my plate method, 546 minutes of moderate intensity exercise/week Blood sugar logs with fasting goals of 80-120 mg/dl, random of less than 180 and in the event of sugars less than 60 mg/dl or greater than 400 mg/dl encouraged to notify the clinic. Advised on the need for annual eye exams, annual foot exams, Pneumonia vaccine. - POCT glycosylated hemoglobin (Hb A1C) - metFORMIN (GLUCOPHAGE) 500 MG tablet; Take 1 tablet (500 mg  total) by mouth daily with breakfast.  Dispense: 90 tablet; Refill: 1 - Microalbumin/Creatinine Ratio, Urine  2. Hypertensive heart disease with chronic combined systolic and diastolic congestive heart failure ([place]) Hypertension is controlled EF of 40 to 45%-euvolemic He is a candidate for SGLT2i Consider at next visit Follow-up with cardiology - LP+Non-HDL Cholesterol - Basic Metabolic Panel - furosemide (LASIX) 40 MG tablet; Take 1 tablet (40 mg total) by mouth 2 (two) times daily.  Dispense: 60 tablet; Refill: 2 - hydrALAZINE (APRESOLINE) 50 MG tablet; Take 1 tablet (50 mg total) by mouth every 8 (eight) hours.  Dispense: 270 tablet; Refill: 1 - carvedilol (COREG) 3.125 MG tablet; Take 1 tablet (3.125 mg total) by mouth 2 (two) times daily with a meal.  Dispense: 180 tablet; Refill: 1 - isosorbide mononitrate (IMDUR) 30 MG 24 hr tablet; Take 1 tablet (30 mg total) by mouth daily.  Dispense: 90 tablet; Refill: 1  3. Early satiety - Ambulatory referral to Gastroenterology - H. pylori breath test  4. Abdominal bloating We will need to exclude H. Pylori If negative will try PPI  5. COPD GOLD III/still smoking  Not ready to quit smoking Continue Breztri - albuterol (PROVENTIL) (2.5 MG/3ML) 0.083% nebulizer solution; Take 3 mLs (2.5 mg total) by nebulization every 6 (six) hours as needed for wheezing or shortness of breath.  Dispense: 75 mL; Refill: 3    Meds ordered this encounter  Medications  . albuterol (PROVENTIL) (2.5 MG/3ML) 0.083% nebulizer solution    Sig: Take 3 mLs (2.5 mg total) by nebulization every 6 (six) hours as needed for wheezing or shortness of breath.    Dispense:  75 mL    Refill:  3  . furosemide (LASIX) 40 MG tablet    Sig: Take 1 tablet (40 mg total) by mouth 2 (two) times daily.    Dispense:  60 tablet    Refill:  2  . hydrALAZINE (APRESOLINE) 50 MG tablet    Sig: Take 1 tablet (50 mg total) by mouth every 8 (eight) hours.    Dispense:  270 tablet     Refill:  1  . carvedilol (COREG) 3.125 MG tablet    Sig: Take 1 tablet (3.125 mg total) by mouth 2 (two) times daily with a meal.    Dispense:  180 tablet    Refill:  1  . isosorbide mononitrate (IMDUR) 30 MG 24 hr tablet    Sig: Take 1 tablet (30 mg total) by mouth daily.    Dispense:  90 tablet    Refill:  1  . metFORMIN (GLUCOPHAGE) 500 MG tablet    Sig: Take 1 tablet (500 mg total) by mouth daily with breakfast.    Dispense:  90 tablet    Refill:  1  . glucose blood (ACCU-CHEK GUIDE) test strip    Sig: Use as instructed daily    Dispense:  100 each    Refill:  12  . Blood Glucose Monitoring Suppl (ACCU-CHEK GUIDE) w/Device KIT    Sig: Use as directed three times daily    Dispense:  1 kit    Refill:  0  . Accu-Chek Softclix Lancets lancets    Sig: Use as instructed daily before meals    Dispense:  100 each    Refill:  12    Follow-up: Return in about 3 months (around 03/20/2022).       Charlott Rakes, MD, FAAFP. [facility] and [place], [place]   12/18/2021, 12:15 PM

## 2021-12-19 ENCOUNTER — Telehealth: Payer: Self-pay | Admitting: Emergency Medicine

## 2021-12-19 LAB — BASIC METABOLIC PANEL
BUN/Creatinine Ratio: 23 (ref 10–24)
BUN: 26 mg/dL (ref 8–27)
CO2: 26 mmol/L (ref 20–29)
Calcium: 9.3 mg/dL (ref 8.6–10.2)
Chloride: 102 mmol/L (ref 96–106)
Creatinine, Ser: 1.12 mg/dL (ref 0.76–1.27)
Glucose: 116 mg/dL — ABNORMAL HIGH (ref 70–99)
Potassium: 4.5 mmol/L (ref 3.5–5.2)
Sodium: 145 mmol/L — ABNORMAL HIGH (ref 134–144)
eGFR: 72 mL/min/{1.73_m2} (ref >59)

## 2021-12-19 LAB — LP+NON-HDL CHOLESTEROL
Cholesterol, Total: 176 mg/dL (ref 100–199)
HDL: 58 mg/dL (ref >39)
LDL Chol Calc (NIH): 89 mg/dL (ref 0–99)
Total Non-HDL-Chol (LDL+VLDL): 118 mg/dL (ref 0–129)
Triglycerides: 167 mg/dL — ABNORMAL HIGH (ref 0–149)
VLDL Cholesterol Cal: 29 mg/dL (ref 5–40)

## 2021-12-19 LAB — MICROALBUMIN / CREATININE URINE RATIO
Creatinine, Urine: 110.6 mg/dL
Microalb/Creat Ratio: 4 mg/g creat (ref 0–29)
Microalbumin, Urine: 4.4 ug/mL

## 2021-12-19 NOTE — Telephone Encounter (Signed)
Copied from CRM [id number]. Topic: Quick Communication - Home Health Verbal Orders >> Dec 19, 2021  4:45 PM Pincus Sanes wrote: Caller/Agency: [facility] Callback Number: [phone] Requesting PT/as in Cardio exercise Frequency: 1 time a week for 4 weeks

## 2021-12-20 LAB — H. PYLORI BREATH TEST: H pylori Breath Test: NEGATIVE

## 2021-12-20 NOTE — Telephone Encounter (Signed)
Verbal order were given for patient. 

## 2022-01-01 ENCOUNTER — Ambulatory Visit (INDEPENDENT_AMBULATORY_CARE_PROVIDER_SITE_OTHER): Payer: Medicare HMO | Admitting: Internal Medicine

## 2022-01-01 ENCOUNTER — Encounter: Payer: Self-pay | Admitting: Internal Medicine

## 2022-01-01 DIAGNOSIS — F17210 Nicotine dependence, cigarettes, uncomplicated: Secondary | ICD-10-CM | POA: Diagnosis not present

## 2022-01-01 DIAGNOSIS — J9612 Chronic respiratory failure with hypercapnia: Secondary | ICD-10-CM | POA: Diagnosis not present

## 2022-01-01 DIAGNOSIS — J449 Chronic obstructive pulmonary disease, unspecified: Secondary | ICD-10-CM

## 2022-01-01 DIAGNOSIS — J9611 Chronic respiratory failure with hypoxia: Secondary | ICD-10-CM

## 2022-01-01 NOTE — Assessment & Plan Note (Addendum)
See copd - 10/22/2016  Walked RA x 3 laps @ 185 ft each stopped due to  End of study moderate pace, no desat  - HCO03  11/11/18  = 41  - 12/31/2018   Walked 2lpm   2 laps @  approx 274ft each @ avg pace  stopped due to  Sob on 2lpm but no desats   - 01/28/2019   Walked RA x one lap =  approx 250 ft - stopped due to  Sob with sats ok on 3lpm POC > referred for POC  - HCO3  06/15/19 = 38  -  04/04/2020   Walked 3lpm  pulsed  3laps @ approx 254ft each @avg  pace  stopped due desats p second lap to 87% corrected on 4lpm pulsed   With sats 98% at end  - HCO3  02/13/21  = 41  - 04/03/2021   Walked on RA  desat to 84% p 250 ft   Then 250 ft on 2lpm cont avg pace sats 92% at lowest - 06/27/2021 Patient Saturations on Room Air at Rest =95%  while Ambulating = 88% and  on 3 Liters of oxygen while Ambulating = 95%  - HC03  7/210/23  = 26    Clearly improved despite continue smoking / advised: Make sure you check your oxygen saturation  AT  your highest level of activity (not after you stop)   to be sure it stays over 90% and adjust  02 flow upward to maintain this level if needed but remember to turn it back to previous settings when you stop (to conserve your supply).          Each maintenance medication was reviewed in detail including emphasizing most importantly the difference between maintenance and prns and under what circumstances the prns are to be triggered using an action plan format where appropriate.  Total time for H and P, chart review, counseling, reviewing hfa/neb/02 device(s) and generating customized AVS unique to this office visit / same day charting =  25 min

## 2022-01-01 NOTE — Patient Instructions (Signed)
Plan A = Automatic = Always= Breztri Take 2 puffs first thing in am and then another 2 puffs about 12 hours later.    Plan B = Backup (to supplement plan A, not to replace it) Only use your albuterol inhaler as a rescue medication to be used if you can't catch your breath by resting or doing a relaxed purse lip breathing pattern.  - The less you use it, the better it will work when you need it. - Ok to use the inhaler up to 2 puffs  every 4 hours if you must but call for appointment if use goes up over your usual need - Don't leave home without it !!  (think of it like the spare tire for your car)   Plan C = Crisis (instead of Plan B but only if Plan B stops working) - only use your albuterol nebulizer if you first try Plan B and it fails to help > ok to use the nebulizer up to every 4 hours but if start needing it regularly call for immediate appointment   Make sure you check your oxygen saturation  at your highest level of activity  to be sure it stays over 90% and adjust  02 flow upward to maintain this level if needed but remember to turn it back to previous settings when you stop (to conserve your supply).      Please schedule a follow up visit in 6  months but call sooner if needed

## 2022-01-01 NOTE — Assessment & Plan Note (Signed)
4-5 min discussion re active cigarette smoking in addition to office E&M  Ask about tobacco use:   ongoing Advise quitting   I took an extended  opportunity with this patient to outline the consequences of continued cigarette use  in airway disorders based on all the data we have from the multiple national lung health studies (perfomed over decades at millions of dollars in cost)  indicating that smoking cessation, not choice of inhalers or physicians, is the most important aspect of his care.   Assess willingness:  Not fully committed at this point Assist in quit attempt:  Per PCP when ready   For smoking cessation classes call [phone]

## 2022-01-01 NOTE — Progress Notes (Signed)
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 male with medical history significant for CABG, hypertension, prior history of stroke without residual, systolic heart failure, current tobacco abuse, chronic headaches, presenting to the emergency department with generalized weakness, and increased shortness of breath, along with  white sputum production. He also reports dyspnea on exertion. In addition, he reports epigastric abdominal pain, which may radiate to the lower portion of his sternum versus chest pain . He denies any lower extremity edema.  Admits to salt rich food ingestion .The symptoms are similar to those of one week ago, at which time, workup was suspicious for acute exacerbation of systolic heart failure. She was started on Cozaar, metoprolol and Lasix 20 mg daily at the time, and was recommended to see cardiology, which he failed to do so. His cardiologist is Dr. Dorris Carnes, last seen in December 2016.the patient continues to smoke 1 pack a day of cigarettes   Discharge Diagnoses:  Acute respiratory failure secondary to CHF exacerbation -O2 sats upon admission 89% -CXR unremarkable for infection -Responded well to IV lasix-->po lasix -Weaned off of O2-SpO2 95% on room air   Acute combined systolic/diastolic heart failure -BNP 2056.8 -Echocardiogram 09/20/2014 showed EF 40-45% -Transition to by mouth Lasix 20 mg daily -Echocardiogram 01/10/2016: EF 98-33%, diastolic dysfunction --AST 147, ALT 110 upon admission -Currently trending downward -hepatitis panel unremarkable -Patient denies drug/alcohol  use -switch metoprolol-->coreg -continue losartan   Essential Hypertension -Has not been taking home medications for over a year due to lack of insurance and monetary funds -Restarted on losartan,  Lasix --switch metoprolol-->coreg 6.25 mg bid   CAD -Currently no chest pain -Continue coreg, losartan, ASA 81 mg daily -LDL 80   Tobacco Abuse -Smoking cessation discussed -Continue nicotine patch   Moderate malnutrition -Nutrition consult   History of Present Illness  02/29/2016 1st [facility] office visit/ Jared [name]  symb 160 2bid/ bid saba (not prn)  Chief Complaint  Patient presents with   Pulmonary Consult    Pt referred by [name], PA, for cough, hemoptysis, and emphysema. Pt c/o hemoptysis x 3 weeks, pt states this occurs daily but has been improving. Pt c/o increase in SOB with activity and rest.   acute  onset epistaxis/ hemoptyis end of Auguest > ER eval 02/20/16 dx RLL pna by CTa chest > rx levaquin/ prednisone and minimal hemoptysis now  Doe = MMRC2 = can't walk a nl pace on a flat grade s sob but does fine slow and flat eg [employer] shopping rec Plan A = Automatic =  Symbicort 160 Take 2 puffs first thing in am and then another 2 puffs about 12 hours later.  Work on inhaler technique: Plan B = Backup Only use your albuterol as a rescue medication\ Plan C = Crisis - only use your albuterol nebulizer if you first try Plan B and it fails to help > ok to use the nebulizer up to every 4 hours but if start needing it regularly call for immediate appointment  The key is to stop smoking completely  before smoking completely stops you!  Please schedule a follow up office visit in 2 weeks, sooner if needed with cxr on return > did not return   Admit date: 05/04/2016 Discharge date: 05/09/2016     DISCHARGE DIAGNOSES:  Principal Problem:   Hemoptysis Active Problems:   Community acquired pneumonia of right lower lobe of lung ([facility]) - - FOB RLL tbbx ATYPICAL PNEUMOCYTES  PROLIFERATION/ LLL BAL 05/08/16    Hypertension   COPD with acute exacerbation (HCC)   Nonischemic dilated cardiomyopathy (HCC)   Cigarette smoker   Chronic systolic CHF (congestive heart failure) ([place])   06/03/16 to ER> only better while on neb    06/26/2016  f/u ov/Jared [name] re:  GOLD III copd / maint symb 160 / saba  Chief Complaint  Patient presents with   Follow-up    pt had CT today. pt states breathing has worsen since last OV. pt c/o increased sob with exertion, prod cough with white mucus, wheezing & chest tightness,  breathing was worse w/in 2 days of last ov / then ran out of lasix one week ago and swelling started to get worse Also worse orthopnea ? Related to leg swelling in terms of timing but pt not sure  Doe now = MMRC3 = can't walk 100 yards even at a slow pace at a flat grade s stopping due to sob  rec Bisoprolol 5 mg one daily in place of the twice daily corevidol and resume your lasix and potassium  Plan A = Automatic = stop symbicort and start bevespi Take 2 puffs first thing in am and then another 2 puffs about 12 hours later.  Plan B = Backup Only use your albuterol as a rescue medication The key is to stop smoking completely before smoking completely stops you!  Please schedule a follow up office visit in 4 weeks, sooner if needed bring all meds      12/31/2018  f/u ov/Jared [name] re: re establish re copd now [place] ? Some bladder problems on LAMA?  Chief Complaint  Patient presents with   Follow-up    Breathing seems slightly worse since the last visit. He is using his ventolin inhaler once per day and nebs about 2 x per day.   Dyspnea:  Worse for at least a year no better with 02/ some discomfort off 02 " like tightness "  Currently MMRC3 = can't walk 100 yards even at a slow pace at a flat grade s stopping due to sob even on 02 up to 4lpm  Cough: none  Sleeping: flat ok / no am cough /  SABA use: as above  Better p nebs/ over using  02: 3lpm 24/7  rec We need you  need you to monitor your 02 level to be sure it's above 90% Be sure protonix 40 mg Take 30-60 min before first meal of the day  We will ask [facility] to do BEST FIT analysis for longterm 02 The key is to stop smoking completely before smoking completely stops you! Add tudorza one puff twice daily  Only use your albuterol as a rescue medication  Only use nebulizer if you try the inhaler first  = change duoneb to pure albuterol 2.5 mg up to  every 4 hours is the max  Prednisone 10 mg take  4 each am x 2 days,   2 each am x 2 days,  1 each am x 2 days and stop  Please schedule a follow up office visit in  4 weeks, sooner if needed  with all medications /inhalers/ solutions in hand so we can verify exactly what you are taking. This includes all medications from all doctors and over the counters Add: f/u sinus dz by CT       04/04/2020  f/u ov/Jared [name] re:  GOLD III copd / 02 dep with ex and sleep  Chief Complaint  Patient presents with   Follow-up    No compaints or concerns with breathing   Dyspnea:  Maybe two aisles at grocery store  Cough: mostly in am's > gray just in am  Sleeping: bed is flat / one pillow  SABA use: maybe once a day/ very rarely neb  02:  3lpm but thinks it comes off/ 3lpm pulsing am   - not using at rest = 95% rec Plan A = Automatic = Always=    Breztri Take 2 puffs first thing in am and then another 2 puffs about 12 hours later.  Work on inhaler technique: Plan B = Backup (to supplement plan A, not to replace it) Only use your albuterol inhaler as a rescue medication Plan C = Crisis (instead of Plan B but only if Plan B stops working) - only use your albuterol nebulizer if you first try Plan B  Rec: 3lpm 24/7 but increase to 4lpm pulsed when walking outside of house Make sure you check your oxygen saturations at highest level of activity      04/03/2021  f/u ov/Jared [name] re: gold 3 copd/ 02 dep 24/7 - breztri 2bid  Chief Complaint  Patient presents with   Follow-up     Patient states SOB has worsened  Dyspnea:  50 ft on 4lpm  Cough: none  Sleeping: bed is flat one pillow/ on cpap now per hospital d/c but doesn't like it  SABA use: 3 x daily hfa/ neb sev times per week 02:  3lpm at rest,  4 lpm  activity / 3lpm Bipap  Covid status:   vax x 4  Rec Plan A = Automatic = Always= Breztri Take 2 puffs first thing in am and then another 2 puffs about 12 hours later.   Plan B = Backup (to supplement plan A, not to replace it) Only use your albuterol inhaler as a rescue medication Plan C = Crisis (instead of Plan B but only if Plan B stops working) - only use your albuterol nebulizer if you first try Plan B and it fails to help Mylanta 2 or maalox plus  - both have simethicone and will help the abdominal swelling as well as trying off the bipap.  Avoid gassy foods especially [place] food, boiled eggs, fresh / undercooked vegetables or salads like spinach  Make sure you check your oxygen saturation  at your highest level of activity   We will walk you today at your normal pace to see how much 02 you need = 2lpm walking nl pace Try off the bipap and let me know if any worse and I will refer you  to our sleep doctors     01/01/2022  f/u ov/Jared [name] re: GOLD 3 copd /0 2 dep   maint on breztri   Chief Complaint  Patient presents with   Follow-up    Breathing is unchanged. He has noticed occ wheezing. He has some prod cough with white sputum. He is using his albuterol inhaler about 2 x per day.    Dyspnea:  PT working with him walking up to 2 m at a time  in house and feels this is helping Cough: min in am / white  Sleeping: flat bed one pillow  SABA use: as above  02: prn 2lpm  Lung cancer screeing yearly in Feb due      No obvious day to day or daytime variability or assoc excess/ purulent sputum or mucus plugs or hemoptysis or cp or chest tightness,  overt sinus or hb symptoms.   Sleeping off bipap  without nocturnal  or early am exacerbation  of respiratory   c/o's or need for noct saba. Also denies any obvious fluctuation of symptoms with weather or environmental changes or other aggravating or alleviating factors except as outlined above   No unusual exposure hx or h/o childhood pna/ asthma or knowledge of premature birth.  Current Allergies, Complete Past Medical History, Past Surgical History, Family History, and Social History were reviewed in [employer] record.  ROS  The following are not active complaints unless bolded Hoarseness, sore throat, dysphagia, dental problems, itching, sneezing,  nasal congestion or discharge of excess mucus or purulent secretions, ear ache,   fever, chills, sweats, unintended wt loss or wt gain, classically pleuritic or exertional cp,  orthopnea pnd or arm/hand swelling  or leg swelling, presyncope, palpitations, abdominal swelling , anorexia, nausea, vomiting, diarrhea  or change in bowel habits or change in bladder habits, change in stools or change in urine, dysuria, hematuria,  rash, arthralgias, visual complaints, headache, numbness, weakness or ataxia or problems with walking or coordination,  change in mood or  memory.        Current Meds  Medication Sig   Accu-Chek Softclix Lancets lancets Use as instructed daily before meals   albuterol (PROVENTIL) (2.5 MG/3ML) 0.083% nebulizer solution Take 3 mLs (2.5 mg total) by nebulization every 6 (six) hours as needed for wheezing or shortness of breath.   albuterol (VENTOLIN HFA) 108 (90 Base) MCG/ACT inhaler INHALE 2 PUFFS INTO THE LUNGS EVERY 6 (SIX) HOURS AS NEEDED FOR WHEEZING OR SHORTNESS OF BREATH. (Patient taking differently: Inhale 2 puffs into the lungs 3 (three) times daily as needed for wheezing or shortness of breath.)   Aspirin Effervescent (ALKA-SELTZER ORIGINAL PO) Take 1 packet by mouth 2 (two) times daily as needed (bloat).   atorvastatin (LIPITOR) 80 MG tablet Take 1 tablet (80 mg total) by mouth daily.   Blood Glucose  Monitoring Suppl (ACCU-CHEK GUIDE) w/Device KIT Use as directed three times daily   Budeson-Glycopyrrol-Formoterol (BREZTRI AEROSPHERE) 160-9-4.8 MCG/ACT AERO Take 2 puffs first thing in morning and then another 2 puffs about 12 hours later. (Patient taking differently: Inhale 2 puffs into the lungs in the morning and at bedtime.)   carvedilol (COREG) 3.125 MG tablet Take 1 tablet (3.125 mg total) by mouth 2 (two) times daily with a meal.   ferrous sulfate 325 (65 FE) MG tablet Take 1 tablet (325 mg total) by mouth 2 (two) times daily with a meal.   fluticasone (FLONASE) 50 MCG/ACT nasal spray Place 1 spray into both nostrils daily. Patient will need to make a follow up appointment for further refills.   furosemide (LASIX) 40 MG tablet Take 1 tablet (40 mg total) by mouth 2 (two) times daily.   gabapentin (NEURONTIN) 300 MG capsule TAKE 1 CAPSULE (300 MG TOTAL) BY MOUTH AT BEDTIME.   glucose blood (ACCU-CHEK GUIDE) test strip Use as instructed daily   hydrALAZINE (APRESOLINE) 50 MG tablet Take 1 tablet (50 mg total) by mouth every 8 (eight) hours.  isosorbide mononitrate (IMDUR) 30 MG 24 hr tablet Take 1 tablet (30 mg total) by mouth daily.   metFORMIN (GLUCOPHAGE) 500 MG tablet Take 1 tablet (500 mg total) by mouth daily with breakfast.   Multiple Vitamin (MULTIVITAMIN WITH MINERALS) TABS tablet Take 1 tablet by mouth daily.   naproxen sodium (ALEVE) 220 MG tablet Take 440 mg by mouth 2 (two) times daily as needed (headache).   nicotine (NICODERM CQ - DOSED IN MG/24 HOURS) 21 mg/24hr patch Place 1 patch (21 mg total) onto the skin daily.   SALINE NA Place 2 sprays into the nose 2 (two) times daily as needed (dryness).   traMADol (ULTRAM) 50 MG tablet Take 1 tablet (50 mg total) by mouth every 6 (six) hours as needed for moderate pain.   triamcinolone cream (KENALOG) 0.1 % Apply 1 application. topically 2 (two) times daily. (Patient taking differently: Apply 1 application  topically See admin  instructions. 1 application 1-2 times a day as needed for rash on elbow)   XARELTO 20 MG TABS tablet TAKE 1 TABLET EVERY DAY WITH SUPPER (MUST HAVE OFFICE VISIT FOR REFILLS) (Patient taking differently: Take 20 mg by mouth daily with supper.)              Objective:   Physical Exam   Wts  01/01/2022      223 06/27/2021      226  04/03/2021    224  10/04/2020      217 04/04/2020    212 06/30/2019      189  01/28/2019      191 12/31/2018      189  10/22/2016      190  07/24/2016        209  06/26/2016        213  05/28/2016      194   02/29/16 177 lb 12.8 oz (80.6 kg)  02/24/16 182 lb 12.8 oz (82.9 kg)  02/20/16 175 lb (79.4 kg)    Vital signs reviewed  01/01/2022  - Note at rest 02 sats  94% on 2lpm Pulsed    General appearance:    amb bm nad  HEENT :  Oropharynx  clear  Nasal turbinates nl    NECK :  without JVD/Nodes/TM/ nl carotid upstrokes bilaterally   LUNGS: no acc muscle use,  Mod barrel  contour chest wall with bilateral  Distant bs s audible wheeze and  without cough on insp or exp maneuvers and mod  Hyperresonant  to  percussion bilaterally     CV:  RRR  no s3 or murmur or increase in P2, and no edema   ABD:  soft and nontender with pos mid insp Hoover's  in the supine position. No bruits or organomegaly appreciated, bowel sounds nl  MS:   Ext warm without deformities or   obvious joint restrictions , calf tenderness, cyanosis or clubbing  SKIN: warm and dry without lesions    NEURO:  alert, approp, nl sensorium with  no motor or cerebellar deficits apparent.                      Assessment & Plan:

## 2022-01-01 NOTE — Assessment & Plan Note (Addendum)
Active smoker 02/29/2016  After extensive coaching HFA effectiveness =    75% > continue symbicort  - Spirometry 05/28/2016  FEV1 1.48 (40%)  Ratio 46  p am symbicort 160 2bid  - 06/26/2016  After extensive coaching HFA effectiveness =    90% > bevespi 2 bid and change coreg to bisoprolol 5 mg daily > not done as of 07/24/2016 > at ov 10/22/2016 off coreg and all inhalers and much better sob but still some cough on entresto maint - Spirometry 10/22/2016  FEV1 1.24 (35%)  Ratio 48 - 12/31/2018    tudorza rx one puff bid  - 01/28/2019  After extensive coaching inhaler device,  effectiveness =    90% with tudorza  03/09/2019 Reports improvement with [name] but not covered, refer to [facility]. May need PA processed- he has been on bevespi and spiriva in the past  - 06/30/2019  After extensive coaching inhaler device,  effectiveness =    90% with elipta try anoro one each am  PFT's  04/04/2020  FEV1 1.25 (35 % ) ratio 0.41  p 6 % improvement from saba p tudorza prior to study with DLCO  8.26 (27%) corrects to 1.38 (34%)  for alv volume and FV curve classic concave pattern  - 04/04/2020  rx  breztri 2bid  - 01/01/2022  After extensive coaching inhaler device,  effectiveness =   80% with hfa   Group D (now reclassified as E) in terms of symptom/risk and laba/lama/ICS  therefore appropriate rx at this point >>>  breztri 2bid and approp saba

## 2022-01-03 DIAGNOSIS — I5043 Acute on chronic combined systolic (congestive) and diastolic (congestive) heart failure: Secondary | ICD-10-CM | POA: Diagnosis not present

## 2022-01-04 ENCOUNTER — Ambulatory Visit: Payer: Medicare HMO | Admitting: Family Medicine

## 2022-01-04 ENCOUNTER — Other Ambulatory Visit: Payer: Self-pay

## 2022-01-08 ENCOUNTER — Other Ambulatory Visit: Payer: Self-pay

## 2022-01-10 ENCOUNTER — Other Ambulatory Visit: Payer: Self-pay

## 2022-01-12 ENCOUNTER — Other Ambulatory Visit: Payer: Self-pay

## 2022-01-15 ENCOUNTER — Other Ambulatory Visit: Payer: Self-pay

## 2022-01-30 NOTE — Progress Notes (Unsigned)
Cardiology Office Note   Date:  01/31/2022   ID:  Jared [name], DOB 1956/01/18, MRN [id number]  PCP:  Jared Rakes, MD  Cardiologist:   Dorris Carnes, MD   Patient presents for f/u of CHF and atrial flutter      History of Present Illness: Jared [name] is a 66 y.o. male with a history of  CAD (mild), chronic combined systolic and diastolic heart failure,  CVA, hypertension, hyperlipidemia, paroxysmal atrial flutter on Xarelto, COPD (on chronic oxygen) and tobacco abuse. EF 30-35% in 2018, up to 45-50% by echo in 04/2018.  I last saw the pt in clinic in Aug 2022   The pt was recently admitted for COPD exacerbation   Echo done   LVEF unchanged from previous  The pt denies CP   Breathing is better since prior to admit      Diet:    Breakfast:  COrn flakes    Dinner   Spaghetti, brocolli   Drinks:   Grape juice   Diet orange soda   Current Meds  Medication Sig   albuterol (PROVENTIL) (2.5 MG/3ML) 0.083% nebulizer solution Take 3 mLs (2.5 mg total) by nebulization every 6 (six) hours as needed for wheezing or shortness of breath.   albuterol (VENTOLIN HFA) 108 (90 Base) MCG/ACT inhaler INHALE 2 PUFFS INTO THE LUNGS EVERY 6 (SIX) HOURS AS NEEDED FOR WHEEZING OR SHORTNESS OF BREATH. (Patient taking differently: Inhale 2 puffs into the lungs 3 (three) times daily as needed for wheezing or shortness of breath.)   Aspirin Effervescent (ALKA-SELTZER ORIGINAL PO) Take 1 packet by mouth 2 (two) times daily as needed (bloat).   atorvastatin (LIPITOR) 80 MG tablet Take 1 tablet (80 mg total) by mouth daily.   Budeson-Glycopyrrol-Formoterol (BREZTRI AEROSPHERE) 160-9-4.8 MCG/ACT AERO Take 2 puffs first thing in morning and then another 2 puffs about 12 hours later. (Patient taking differently: Inhale 2 puffs into the lungs in the morning and at bedtime.)   carvedilol (COREG) 3.125 MG tablet Take 1 tablet (3.125 mg total) by mouth 2 (two) times daily with a meal.   ezetimibe (ZETIA) 10 MG tablet  Take 1 tablet (10 mg total) by mouth daily.   fluticasone (FLONASE) 50 MCG/ACT nasal spray Place 1 spray into both nostrils daily. Patient will need to make a follow up appointment for further refills.   furosemide (LASIX) 40 MG tablet Take 1 tablet (40 mg total) by mouth 2 (two) times daily.   gabapentin (NEURONTIN) 300 MG capsule TAKE 1 CAPSULE (300 MG TOTAL) BY MOUTH AT BEDTIME.   hydrALAZINE (APRESOLINE) 50 MG tablet Take 1 tablet (50 mg total) by mouth every 8 (eight) hours.   isosorbide mononitrate (IMDUR) 30 MG 24 hr tablet Take 1 tablet (30 mg total) by mouth daily.   metFORMIN (GLUCOPHAGE) 500 MG tablet Take 1 tablet (500 mg total) by mouth daily with breakfast.   naproxen sodium (ALEVE) 220 MG tablet Take 440 mg by mouth 2 (two) times daily as needed (headache).   nitroGLYCERIN (NITROSTAT) 0.4 MG SL tablet Place 1 tablet (0.4 mg total) under the tongue every 5 (five) minutes as needed for chest pain.   SALINE NA Place 2 sprays into the nose 2 (two) times daily as needed (dryness).   traMADol (ULTRAM) 50 MG tablet Take 1 tablet (50 mg total) by mouth every 6 (six) hours as needed for moderate pain.   triamcinolone cream (KENALOG) 0.1 % Apply 1 application. topically 2 (two) times daily. (Patient taking differently:  Apply 1 application  topically See admin instructions. 1 application 1-2 times a day as needed for rash on elbow)   XARELTO 20 MG TABS tablet TAKE 1 TABLET EVERY DAY WITH SUPPER (MUST HAVE OFFICE VISIT FOR REFILLS) (Patient taking differently: Take 20 mg by mouth daily with supper.)     Allergies:   Lisinopril   Past Medical History:  Diagnosis Date   AVM (arteriovenous malformation)    CAD (coronary artery disease)    a. LHC 5/12:  LAD 20, pLCx 20, pRCA 40, dRCA 40, EF 35%, diff HK  //  b. Myoview 4/16: Overall Impression:  High risk stress nuclear study There is no evidence of ischemia.  There is severe LV dysfunction. LV Ejection Fraction: 30%.  LV Wall Motion:  There is  global LV hypokinesis.     CAP (community acquired pneumonia) 09/2013   Chronic combined systolic and diastolic CHF (congestive heart failure) (HCC)    a. Echo 4/16:Mild LVH, EF 40-45%, diffuse HK //  b. Echo 8/17: EF 35-40%, diffuse HK, diastolic dysfunction, aortic sclerosis, trivial MR, moderate LAE, normal RVSF, moderate RAE, mild TR, PASP 42 mmHg // c. Echo 4/18: Mild concentric LVH, EF 30-35, normal wall motion, grade 1 diastolic dysfunction, PASP 49   Chronic respiratory failure (HCC)    Cluster headache    "hx; haven't had one in awhile" (01/09/2016)   COPD (chronic obstructive pulmonary disease) (HCC)    [name] 01/09/2016   History of CVA (cerebrovascular accident)    Hypertension    IDA (iron deficiency anemia)    Moderate tobacco use disorder    NICM (nonischemic cardiomyopathy) (HCC)    Nicotine addiction    Tobacco abuse     Past Surgical History:  Procedure Laterality Date   BIOPSY  11/12/2020   Procedure: BIOPSY;  Surgeon: Lemar Lofty., MD;  Location: [facility];  Service: Gastroenterology;;   CARDIAC CATHETERIZATION  10/2010   LM normal, LAD with 20% irregularities, LCX with 20%, RCA with 40% prox and 40% distal - EF of 35%   CATARACT EXTRACTION, BILATERAL     COLONOSCOPY W/ BIOPSIES AND POLYPECTOMY     COLONOSCOPY WITH PROPOFOL N/A 09/06/2018   Procedure: COLONOSCOPY WITH PROPOFOL;  Surgeon: Tressia Danas, MD;  Location: [facility] ENDOSCOPY;  Service: Gastroenterology;  Laterality: N/A;   ENTEROSCOPY N/A 09/28/2018   Procedure: ENTEROSCOPY;  Surgeon: Jeani Hawking, MD;  Location: [facility] ENDOSCOPY;  Service: Endoscopy;  Laterality: N/A;   ENTEROSCOPY N/A 10/28/2018   Procedure: ENTEROSCOPY;  Surgeon: Tressia Danas, MD;  [facility]COPY;  Service: Gastroenterology;  Laterality: N/A;   ENTEROSCOPY N/A 10/09/2020   Procedure: ENTEROSCOPY;  Surgeon: Hilarie Fredrickson, MD;  Location: [facility] ENDOSCOPY;  Service: Endoscopy;  Laterality: N/A;   ESOPHAGOGASTRODUODENOSCOPY N/A  11/12/2020   Procedure: ESOPHAGOGASTRODUODENOSCOPY (EGD);  Surgeon: Lemar Lofty., MD;  Location: [facility];  Service: Gastroenterology;  Laterality: N/A;   ESOPHAGOGASTRODUODENOSCOPY (EGD) WITH PROPOFOL N/A 09/05/2018   Procedure: ESOPHAGOGASTRODUODENOSCOPY (EGD) WITH PROPOFOL;  Surgeon: Benancio Deeds, MD;  Location: [facility] ENDOSCOPY;  Service: Gastroenterology;  Laterality: N/A;   ESOPHAGOGASTRODUODENOSCOPY (EGD) WITH PROPOFOL N/A 11/19/2020   Procedure: ESOPHAGOGASTRODUODENOSCOPY (EGD) WITH PROPOFOL;  Surgeon: Beverley Fiedler, MD;  Location: [facility] ENDOSCOPY;  Service: Gastroenterology;  Laterality: N/A;   EXCISION MASS HEAD     GIVENS CAPSULE STUDY N/A 09/06/2018   Procedure: GIVENS CAPSULE STUDY;  Surgeon: Tressia Danas, MD;  Location: [facility] ENDOSCOPY;  Service: Gastroenterology;  Laterality: N/A;   GIVENS CAPSULE STUDY N/A 09/26/2018  Procedure: GIVENS CAPSULE STUDY;  Surgeon: Jerene Bears, MD;  Location: [place];  Service: Gastroenterology;  Laterality: N/A;   GIVENS CAPSULE STUDY N/A 06/14/2019   Procedure: GIVENS CAPSULE STUDY;  Surgeon: Mauri Pole, MD;  Location: [place] ENDOSCOPY;  Service: Endoscopy;  Laterality: N/A;   HEMOSTASIS CLIP PLACEMENT  11/12/2020   Procedure: HEMOSTASIS CLIP PLACEMENT;  Surgeon: Irving Copas., MD;  Location: [facility];  Service: Gastroenterology;;   HEMOSTASIS CONTROL  11/12/2020   Procedure: HEMOSTASIS CONTROL;  Surgeon: Irving Copas., MD;  Location: [facility];  Service: Gastroenterology;;   HOT HEMOSTASIS N/A 10/28/2018   Procedure: HOT HEMOSTASIS (ARGON PLASMA COAGULATION/BICAP);  Surgeon: Thornton Park, MD;  Location: [place];  Service: Gastroenterology;  Laterality: N/A;   HOT HEMOSTASIS N/A 11/12/2020   Procedure: HOT HEMOSTASIS (ARGON PLASMA COAGULATION/BICAP);  Surgeon: Irving Copas., MD;  Location: [facility];  Service: Gastroenterology;  Laterality: N/A;   INCISION AND DRAINAGE PERIRECTAL  ABSCESS N/A 06/05/2017   Procedure: IRRIGATION AND DEBRIDEMENT PERIRECTAL ABSCESS;  Surgeon: Ileana Roup, MD;  Location: [place];  Service: General;  Laterality: N/A;   SUBMUCOSAL TATTOO INJECTION  11/12/2020   Procedure: SUBMUCOSAL TATTOO INJECTION;  Surgeon: Irving Copas., MD;  Location: [facility];  Service: Gastroenterology;;   VIDEO BRONCHOSCOPY Bilateral 05/08/2016   Procedure: VIDEO BRONCHOSCOPY WITH FLUORO;  Surgeon: Rigoberto Noel, MD;  Location: [place];  Service: Cardiopulmonary;  Laterality: Bilateral;     Social History:  The patient  reports that he has been smoking cigarettes. He has a 23.50 pack-year smoking history. He has never used smokeless tobacco. He reports that he does not currently use alcohol. He reports that he does not use drugs.   Family History:  The patient's family history includes Cancer in his father; Colon cancer in his mother; Diabetes in his brother, mother, and sister; Heart disease in his mother; Liver cancer in his mother.    ROS:  Please see the history of present illness. All other systems are reviewed and  Negative to the above problem except as noted.    PHYSICAL EXAM: VS:  BP 102/68   Pulse 80   Ht 6\' 3"  (1.905 m)   Wt 226 lb (102.5 kg)   BMI 28.25 kg/m     GEN: Well nourished, well developed, HEENT: normal  Neck:JVP is normal  Cardiac: RRR; no murmurs  No  LE edema  Respiratory: Decreased airflow bilaterally.  No rales    GI: soft   Nontender   MS: no deformity Moving all extremities   Skin: warm and dry, no rash Neuro:  Strength and sensation are intact Psych: euthymic mood, full affect   EKG:  EKG is not ordered today.  Echo:   July 2023 1. Left ventricular ejection fraction, by estimation, is 45 to 50%. The  left ventricle has mildly decreased function. The left ventricle  demonstrates global hypokinesis. There is mild concentric left ventricular  hypertrophy. Left ventricular diastolic  parameters are  consistent with Grade I diastolic dysfunction (impaired  relaxation).   2. Right ventricular systolic function is normal. The right ventricular  size is normal.   3. The mitral valve is normal in structure. No evidence of mitral valve  regurgitation. No evidence of mitral stenosis.   4. The aortic valve is normal in structure. Aortic valve regurgitation is  not visualized. Aortic valve sclerosis/calcification is present, without  any evidence of aortic stenosis.   5. The inferior vena cava is normal in  size with greater than 50%  respiratory variability, suggesting right atrial pressure of 3 mmHg.  Lipid Panel    Component Value Date/Time   CHOL 176 12/18/2021 1121   TRIG 167 (H) 12/18/2021 1121   HDL 58 12/18/2021 1121   CHOLHDL 2.9 01/29/2020 1020   CHOLHDL 2.8 01/11/2016 0446   VLDL 14 01/11/2016 0446   LDLCALC 89 12/18/2021 1121      Wt Readings from Last 3 Encounters:  01/31/22 226 lb (102.5 kg)  01/01/22 223 lb 12.8 oz (101.5 kg)  12/18/21 230 lb 9.6 oz (104.6 kg)      ASSESSMENT AND PLAN:  1  CAD   Pt denies CP  2  HFmrEF   Recent echo LVEF 45 to 50%   Volume status appears OK   Follow   3  COPD   Pt on chronic O2   Back to baseline of 2 L       4   Atrial flutter.    Paroxysmal  Continue Xarelto.  Would recomm tele     5  hypertension. BP is excellent   He denies dizzienss    Follow    6.  Dyslipidemia.  Contiue lipitor  Add Zetia  LDL currently 79   Repeat lipomed and liver panel in 8 wks     7.  History of CVA.  Continue Xarelto     8   Anemia   Last Hgb 15     9  GI   Hx of bleeding    Current medicines are reviewed at length with the patient today.  The patient does not have concerns regarding medicines.  Signed, Dietrich Pates, MD  01/31/2022 6:33 PM    [facility] [address] [place], [place], [place]  [address] Phone: [phone]; Fax: [phone]

## 2022-01-31 ENCOUNTER — Other Ambulatory Visit: Payer: Self-pay

## 2022-01-31 ENCOUNTER — Ambulatory Visit (INDEPENDENT_AMBULATORY_CARE_PROVIDER_SITE_OTHER): Payer: Medicare HMO | Admitting: Internal Medicine

## 2022-01-31 ENCOUNTER — Encounter: Payer: Self-pay | Admitting: Internal Medicine

## 2022-01-31 VITALS — BP 102/68 | HR 80 | Ht 75.0 in | Wt 226.0 lb

## 2022-01-31 DIAGNOSIS — I2510 Atherosclerotic heart disease of native coronary artery without angina pectoris: Secondary | ICD-10-CM

## 2022-01-31 DIAGNOSIS — Z79899 Other long term (current) drug therapy: Secondary | ICD-10-CM

## 2022-01-31 MED ORDER — EZETIMIBE 10 MG PO TABS
10.0000 mg | ORAL_TABLET | Freq: Every day | ORAL | 3 refills | Status: DC
  Filled 2022-01-31: qty 90, 90d supply, fill #0
  Filled 2022-04-10 – 2022-04-16 (×2): qty 90, 90d supply, fill #1
  Filled 2022-08-06: qty 90, 90d supply, fill #2
  Filled 2022-12-06 (×2): qty 90, 90d supply, fill #3

## 2022-01-31 NOTE — Patient Instructions (Addendum)
Medication Instructions:  Zetia 10 mg daily  *If you need a refill on your cardiac medications before your next appointment, please call your pharmacy*    Lab Work: NMR in 8 weeks (Mar 28, 2022 [place])   If you have labs (blood work) drawn today and your tests are completely normal, you will receive your results only by: MyChart Message (if you have MyChart) OR A paper copy in the mail If you have any lab test that is abnormal or we need to change your treatment, we will call you to review the results.   Testing/Procedures:    Follow-Up: At [facility], you and your health needs are our priority.  As part of our continuing mission to provide you with exceptional heart care, we have created designated Provider Care Teams.  These Care Teams include your primary Cardiologist (physician) and Advanced Practice Providers (APPs -  Physician Assistants and Nurse Practitioners) who all work together to provide you with the care you need, when you need it.  We recommend signing up for the patient portal called "MyChart".  Sign up information is provided on this After Visit Summary.  MyChart is used to connect with patients for Virtual Visits (Telemedicine).  Patients are able to view lab/test results, encounter notes, upcoming appointments, etc.  Non-urgent messages can be sent to your provider as well.   To learn more about what you can do with MyChart, go to [email or web address].    Your next appointment:   1 year(s)  The format for your next appointment:   In Person  Provider:   Dietrich Pates, MD     Other Instructions  Important Information About Sugar

## 2022-02-02 ENCOUNTER — Other Ambulatory Visit: Payer: Self-pay | Admitting: Family Medicine

## 2022-02-02 ENCOUNTER — Other Ambulatory Visit: Payer: Self-pay

## 2022-02-02 DIAGNOSIS — G629 Polyneuropathy, unspecified: Secondary | ICD-10-CM

## 2022-02-02 DIAGNOSIS — R040 Epistaxis: Secondary | ICD-10-CM

## 2022-02-02 MED ORDER — FLUTICASONE PROPIONATE 50 MCG/ACT NA SUSP
1.0000 | Freq: Every day | NASAL | 2 refills | Status: DC
  Filled 2022-02-02: qty 16, 60d supply, fill #0

## 2022-02-02 MED ORDER — GABAPENTIN 300 MG PO CAPS
ORAL_CAPSULE | Freq: Every day | ORAL | 1 refills | Status: DC
  Filled 2022-02-02: qty 30, 30d supply, fill #0
  Filled 2022-04-10: qty 30, 30d supply, fill #1
  Filled 2022-05-11: qty 30, 30d supply, fill #2
  Filled 2022-06-12: qty 30, 30d supply, fill #3
  Filled 2022-07-11: qty 30, 30d supply, fill #4

## 2022-02-02 NOTE — Telephone Encounter (Signed)
Requested Prescriptions  Pending Prescriptions Disp Refills  . gabapentin (NEURONTIN) 300 MG capsule 30 capsule 2    Sig: TAKE 1 CAPSULE (300 MG TOTAL) BY MOUTH AT BEDTIME.     Neurology: Anticonvulsants - gabapentin Passed - 02/02/2022  1:10 PM      Passed - Cr in normal range and within 360 days    Creat  Date Value Ref Range Status  07/30/2016 1.20 0.70 - 1.25 mg/dL Final    Comment:      For patients > or = 68 years of age: The upper reference limit for Creatinine is approximately 13% higher for people identified as African-American.      Creatinine, Ser  Date Value Ref Range Status  12/18/2021 1.12 0.76 - 1.27 mg/dL Final         Passed - Completed PHQ-2 or PHQ-9 in the last 360 days      Passed - Valid encounter within last 12 months    Recent Outpatient Visits          1 month ago Type 2 diabetes mellitus with other specified complication, without long-term current use of insulin (HCC)   [facility] [place], Odette Horns, MD   4 months ago Chronic pain of both knees   [facility] [place], [place], [place]   11 months ago COPD GOLD III/still smoking    [facility] [place], Odette Horns, MD   1 year ago Epistaxis   [facility] [name], MD   1 year ago Acute gastric ulcer with hemorrhage   [facility] [name], MD      Future Appointments            In 1 month [name], MD [facility]   In 5 months [name], Charlaine Dalton, MD [facility]           . fluticasone (FLONASE) 50 MCG/ACT nasal spray 16 g 1    Sig: Place 1 spray into both nostrils daily. Patient will need to make a follow up appointment for further refills.     Ear, Nose, and Throat: Nasal Preparations - Corticosteroids Passed - 02/02/2022  1:10 PM      Passed - Valid encounter within last 12 months    Recent  Outpatient Visits          1 month ago Type 2 diabetes mellitus with other specified complication, without long-term current use of insulin (HCC)   [facility] [place], Odette Horns, MD   4 months ago Chronic pain of both knees   [name] [place], [place], [place]   11 months ago COPD GOLD III/still smoking    [facility] [place], Odette Horns, MD   1 year ago Epistaxis   [facility] [name], MD   1 year ago Acute gastric ulcer with hemorrhage   [facility] [name], MD      Future Appointments            In 1 month [name], MD [facility]   In 5 months [name], Charlaine Dalton, MD [facility]

## 2022-02-13 ENCOUNTER — Ambulatory Visit (INDEPENDENT_AMBULATORY_CARE_PROVIDER_SITE_OTHER): Payer: Medicare HMO | Admitting: Gastroenterology

## 2022-02-13 ENCOUNTER — Encounter: Payer: Self-pay | Admitting: Gastroenterology

## 2022-02-13 VITALS — BP 104/60 | HR 96 | Ht 73.0 in | Wt 228.0 lb

## 2022-02-13 DIAGNOSIS — D500 Iron deficiency anemia secondary to blood loss (chronic): Secondary | ICD-10-CM

## 2022-02-13 DIAGNOSIS — R6881 Early satiety: Secondary | ICD-10-CM | POA: Diagnosis not present

## 2022-02-13 DIAGNOSIS — R140 Abdominal distension (gaseous): Secondary | ICD-10-CM | POA: Diagnosis not present

## 2022-02-13 NOTE — Progress Notes (Signed)
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  He needs long-term iron replacement and regular monitoring of his CBC.  (Last seen by hematology on 4 /01/2020, and if anemia worsens will need an appointment with them.   Petra Kuba of his upper abdominal symptoms is unclear.  He looks well and otherwise feels well overall.  He has chronic dyspnea and occasional use of supplemental oxygen.  I wonder if some of it may be aerophagia due to his severe COPD.  He does not have sleep apnea/CPAP use.  Lung hyperinflation may be putting downward pressure on the diaphragm and some extrinsic gastric compression. Must consider intra-abdominal fluid collection, mass or less likely obstruction."  CTAP at that time showed emphysema, atherosclerotic disease and no other intra-abdominal/pelvic pathology.  He was hospitalized few times in May and June 2022 with ulcer bleeding in the gastric cardia treated endoscopically and no further bleeding on repeat inpatient EGD that June. _____________   Jared [name] remains in poor health and was hospitalized 2 months ago for exacerbation of his severe oxygen requiring COPD in the setting of cardiomyopathy and ongoing tobacco abuse.  He continues to complain of generalized abdominal discomfort with bloating fairly constantly.  He has early satiety, denies nausea vomiting or dysphagia.  2 formed BMs per day without blood, weight stable.  (Normal inpatient colonoscopy to the terminal ileum March 2020) ROS:  Review of Systems  Constitutional:  Negative for appetite change and unexpected weight change.  HENT:  Negative  for mouth sores and voice change.   Eyes:  Negative for pain and redness.  Respiratory:  Positive for shortness of breath. Negative for cough.   Cardiovascular:  Negative for chest pain and palpitations.  Genitourinary:  Negative for dysuria and hematuria.  Musculoskeletal:  Positive for arthralgias. Negative for myalgias.  Skin:  Negative for pallor and rash.  Neurological:  Negative for weakness and headaches.  Hematological:  Negative for adenopathy.     Past Medical History: Past Medical History:  Diagnosis Date   AVM (arteriovenous malformation)    CAD (coronary artery disease)    a. LHC 5/12:  LAD 20, pLCx 20, pRCA 40, dRCA 40, EF 35%, diff HK  //  b. Myoview 4/16: Overall Impression:  High risk stress nuclear study There is no evidence of ischemia.  There is severe LV dysfunction. LV Ejection Fraction: 30%.  LV Wall Motion:  There is global LV hypokinesis.     CAP (community acquired pneumonia) 09/2013   Chronic combined systolic and diastolic CHF (congestive heart failure) ([place])    a. Echo 4/16:Mild LVH, EF 40-45%, diffuse HK //  b. Echo 8/17: EF 35-40%, diffuse HK, diastolic dysfunction, aortic sclerosis, trivial MR, moderate LAE, normal RVSF, moderate RAE, mild TR, PASP 42 mmHg // c. Echo 4/18: Mild concentric LVH, EF 30-35, normal wall motion, grade 1 diastolic dysfunction, PASP 49   Chronic respiratory failure (HCC)    Cluster headache    "hx; haven't had one in awhile" (01/09/2016)   COPD (chronic obstructive pulmonary disease) ([place])    [name] 01/09/2016   DM (diabetes mellitus) ([place])    History of CVA (cerebrovascular accident)    Hypertension  IDA (iron deficiency anemia)    Moderate tobacco use disorder    NICM (nonischemic cardiomyopathy) (HCC)    Nicotine addiction    Tobacco abuse      Past Surgical History: Past Surgical History:  Procedure Laterality Date   BIOPSY  11/12/2020   Procedure: BIOPSY;  Surgeon: Irving Copas., MD;  Location: [facility]  ENDOSCOPY;  Service: Gastroenterology;;   CARDIAC CATHETERIZATION  10/2010   LM normal, LAD with 20% irregularities, LCX with 20%, RCA with 40% prox and 40% distal - EF of 35%   CATARACT EXTRACTION, BILATERAL     COLONOSCOPY W/ BIOPSIES AND POLYPECTOMY     COLONOSCOPY WITH PROPOFOL N/A 09/06/2018   Procedure: COLONOSCOPY WITH PROPOFOL;  Surgeon: Thornton Park, MD;  Location: [place];  Service: Gastroenterology;  Laterality: N/A;   ENTEROSCOPY N/A 09/28/2018   Procedure: ENTEROSCOPY;  Surgeon: Carol Ada, MD;  Location: [place];  Service: Endoscopy;  Laterality: N/A;   ENTEROSCOPY N/A 10/28/2018   Procedure: ENTEROSCOPY;  Surgeon: Thornton Park, MD;  Location: [place];  Service: Gastroenterology;  Laterality: N/A;   ENTEROSCOPY N/A 10/09/2020   Procedure: ENTEROSCOPY;  Surgeon: Irene Shipper, MD;  Location: [facility] ENDOSCOPY;  Service: Endoscopy;  Laterality: N/A;   ESOPHAGOGASTRODUODENOSCOPY N/A 11/12/2020   Procedure: ESOPHAGOGASTRODUODENOSCOPY (EGD);  Surgeon: Irving Copas., MD;  Location: [facility];  Service: Gastroenterology;  Laterality: N/A;   ESOPHAGOGASTRODUODENOSCOPY (EGD) WITH PROPOFOL N/A 09/05/2018   Procedure: ESOPHAGOGASTRODUODENOSCOPY (EGD) WITH PROPOFOL;  Surgeon: Yetta Flock, MD;  Location: [place];  Service: Gastroenterology;  Laterality: N/A;   ESOPHAGOGASTRODUODENOSCOPY (EGD) WITH PROPOFOL N/A 11/19/2020   Procedure: ESOPHAGOGASTRODUODENOSCOPY (EGD) WITH PROPOFOL;  Surgeon: Jerene Bears, MD;  Location: [facility] ENDOSCOPY;  Service: Gastroenterology;  Laterality: N/A;   EXCISION MASS HEAD     GIVENS CAPSULE STUDY N/A 09/06/2018   Procedure: GIVENS CAPSULE STUDY;  Surgeon: Thornton Park, MD;  Location: [place];  Service: Gastroenterology;  Laterality: N/A;   GIVENS CAPSULE STUDY N/A 09/26/2018   Procedure: GIVENS CAPSULE STUDY;  Surgeon: Jerene Bears, MD;  Location: [place];  Service: Gastroenterology;  Laterality: N/A;   GIVENS  CAPSULE STUDY N/A 06/14/2019   Procedure: GIVENS CAPSULE STUDY;  Surgeon: Mauri Pole, MD;  Location: [facility] ENDOSCOPY;  Service: Endoscopy;  Laterality: N/A;   HEMOSTASIS CLIP PLACEMENT  11/12/2020   Procedure: HEMOSTASIS CLIP PLACEMENT;  Surgeon: Irving Copas., MD;  Location: [facility];  Service: Gastroenterology;;   HEMOSTASIS CONTROL  11/12/2020   Procedure: HEMOSTASIS CONTROL;  Surgeon: Irving Copas., MD;  Location: [facility];  Service: Gastroenterology;;   HOT HEMOSTASIS N/A 10/28/2018   Procedure: HOT HEMOSTASIS (ARGON PLASMA COAGULATION/BICAP);  Surgeon: Thornton Park, MD;  Location: [place];  Service: Gastroenterology;  Laterality: N/A;   HOT HEMOSTASIS N/A 11/12/2020   Procedure: HOT HEMOSTASIS (ARGON PLASMA COAGULATION/BICAP);  Surgeon: Irving Copas., MD;  Location: [facility];  Service: Gastroenterology;  Laterality: N/A;   INCISION AND DRAINAGE PERIRECTAL ABSCESS N/A 06/05/2017   Procedure: IRRIGATION AND DEBRIDEMENT PERIRECTAL ABSCESS;  Surgeon: Ileana Roup, MD;  Location: [place];  Service: General;  Laterality: N/A;   SUBMUCOSAL TATTOO INJECTION  11/12/2020   Procedure: SUBMUCOSAL TATTOO INJECTION;  Surgeon: Irving Copas., MD;  Location: [facility];  Service: Gastroenterology;;   VIDEO BRONCHOSCOPY Bilateral 05/08/2016   Procedure: VIDEO BRONCHOSCOPY WITH FLUORO;  Surgeon: Rigoberto Noel, MD;  Location: [place];  Service: Cardiopulmonary;  Laterality: Bilateral;     Family History: Family History  Problem  Relation Age of Onset   Heart disease Mother    Diabetes Mother    Colon cancer Mother    Liver cancer Mother    Cancer Father        type unknown   Diabetes Sister        x 2   Diabetes Brother     Social History: Social History   Socioeconomic History   Marital status: Single    Spouse name: Not on file   Number of children: 1   Years of education: Not on file   Highest education level: Not on  file  Occupational History   Occupation: retired  Tobacco Use   Smoking status: Every Day    Packs/day: 0.50    Years: 47.00    Total pack years: 23.50    Types: Cigarettes   Smokeless tobacco: Never   Tobacco comments:    4/5 cigs  Vaping Use   Vaping Use: Never used  Substance and Sexual Activity   Alcohol use: Not Currently    Alcohol/week: 0.0 standard drinks of alcohol    Comment: last drink was before xmas   Drug use: No    Types: Cocaine, Marijuana    Comment: "nothing in 20 years"   Sexual activity: Not Currently  Other Topics Concern   Not on file  Social History Narrative   unemployed   Social Determinants of Health   Financial Resource Strain: Low Risk  (01/06/2021)   Overall Financial Resource Strain (CARDIA)    Difficulty of Paying Living Expenses: Not very hard  Food Insecurity: No Food Insecurity (01/06/2021)   Hunger Vital Sign    Worried About Running Out of Food in the Last Year: Never true    [place] in the Last Year: Never true  Transportation Needs: No Transportation Needs (01/06/2021)   PRAPARE - Hydrologist (Medical): No    Lack of Transportation (Non-Medical): No  Physical Activity: Inactive (01/06/2021)   Exercise Vital Sign    Days of Exercise per Week: 0 days    Minutes of Exercise per Session: 0 min  Stress: No Stress Concern Present (01/06/2021)   [place]    Feeling of Stress : Not at all  Social Connections: Moderately Integrated (01/06/2021)   Social Connection and Isolation Panel [NHANES]    Frequency of Communication with Friends and Family: More than three times a week    Frequency of Social Gatherings with Friends and Family: Once a week    Attends Religious Services: More than 4 times per year    Active Member of [employer] or Organizations: Yes    Attends Music therapist: More than 4 times per year    Marital Status:  Divorced   He continues to smoke  Allergies: Allergies  Allergen Reactions   Lisinopril Cough    Outpatient Meds: Current Outpatient Medications  Medication Sig Dispense Refill   Accu-Chek Softclix Lancets lancets Use as instructed daily before meals 100 each 12   albuterol (PROVENTIL) (2.5 MG/3ML) 0.083% nebulizer solution Take 3 mLs (2.5 mg total) by nebulization every 6 (six) hours as needed for wheezing or shortness of breath. 75 mL 3   albuterol (VENTOLIN HFA) 108 (90 Base) MCG/ACT inhaler INHALE 2 PUFFS INTO THE LUNGS EVERY 6 (SIX) HOURS AS NEEDED FOR WHEEZING OR SHORTNESS OF BREATH. (Patient taking differently: Inhale 2 puffs into the lungs 3 (three) times daily as  needed for wheezing or shortness of breath.) 18 g 5   Aspirin Effervescent (ALKA-SELTZER ORIGINAL PO) Take 1 packet by mouth 2 (two) times daily as needed (bloat).     atorvastatin (LIPITOR) 80 MG tablet Take 1 tablet (80 mg total) by mouth daily. 90 tablet 0   Blood Glucose Monitoring Suppl (ACCU-CHEK GUIDE) w/Device KIT Use as directed three times daily 1 kit 0   Budeson-Glycopyrrol-Formoterol (BREZTRI AEROSPHERE) 160-9-4.8 MCG/ACT AERO Take 2 puffs first thing in morning and then another 2 puffs about 12 hours later. (Patient taking differently: Inhale 2 puffs into the lungs in the morning and at bedtime.) 10.7 g 6   carvedilol (COREG) 3.125 MG tablet Take 1 tablet (3.125 mg total) by mouth 2 (two) times daily with a meal. 180 tablet 1   ezetimibe (ZETIA) 10 MG tablet Take 1 tablet (10 mg total) by mouth daily. 90 tablet 3   ferrous sulfate 325 (65 FE) MG tablet Take 1 tablet (325 mg total) by mouth 2 (two) times daily with a meal. 60 tablet 1   furosemide (LASIX) 40 MG tablet Take 1 tablet (40 mg total) by mouth 2 (two) times daily. 60 tablet 2   gabapentin (NEURONTIN) 300 MG capsule TAKE 1 CAPSULE (300 MG TOTAL) BY MOUTH AT BEDTIME. 90 capsule 1   glucose blood (ACCU-CHEK GUIDE) test strip Use as instructed daily 100  each 12   hydrALAZINE (APRESOLINE) 50 MG tablet Take 1 tablet (50 mg total) by mouth every 8 (eight) hours. 270 tablet 1   isosorbide mononitrate (IMDUR) 30 MG 24 hr tablet Take 1 tablet (30 mg total) by mouth daily. 90 tablet 1   metFORMIN (GLUCOPHAGE) 500 MG tablet Take 1 tablet (500 mg total) by mouth daily with breakfast. 90 tablet 1   Multiple Vitamin (MULTIVITAMIN WITH MINERALS) TABS tablet Take 1 tablet by mouth daily.     naproxen sodium (ALEVE) 220 MG tablet Take 440 mg by mouth 2 (two) times daily as needed (headache).     nitroGLYCERIN (NITROSTAT) 0.4 MG SL tablet Place 1 tablet (0.4 mg total) under the tongue every 5 (five) minutes as needed for chest pain.     OXYGEN Inhale 2 L/min into the lungs as directed. On exertion or when walking     SALINE NA Place 2 sprays into the nose 2 (two) times daily as needed (dryness).     traMADol (ULTRAM) 50 MG tablet Take 1 tablet (50 mg total) by mouth every 6 (six) hours as needed for moderate pain. 30 tablet 0   triamcinolone cream (KENALOG) 0.1 % Apply 1 application. topically 2 (two) times daily. (Patient taking differently: Apply 1 application  topically See admin instructions. 1 application 1-2 times a day as needed for rash on elbow) 45 g 1   XARELTO 20 MG TABS tablet TAKE 1 TABLET EVERY DAY WITH SUPPER (MUST HAVE OFFICE VISIT FOR REFILLS) (Patient taking differently: Take 20 mg by mouth daily with supper.) 90 tablet 0   fluticasone (FLONASE) 50 MCG/ACT nasal spray Place 1 spray into both nostrils daily. (Patient not taking: Reported on 02/13/2022) 16 g 2   No current facility-administered medications for this visit.      ___________________________________________________________________ Objective   Exam:  BP 104/60 (BP Location: Left Arm, Patient Position: Sitting, Cuff Size: Normal)   Pulse 96   Ht 6' 1" (1.854 m) Comment: height measured without shoes  Wt 228 lb (103.4 kg)   BMI 30.08 kg/m  Wt Readings from Last 3  Encounters:   02/13/22 228 lb (103.4 kg)  01/31/22 226 lb (102.5 kg)  01/01/22 223 lb 12.8 oz (101.5 kg)    General: Chronically ill-appearing, wearing supplemental oxygen.  Antalgic gait, gets on exam table independently. Eyes: sclera anicteric, no redness ENT: oral mucosa moist without lesions, no cervical or supraclavicular lymphadenopathy CV: Regular without murmur, no JVD, no peripheral edema Resp: Short of breath at rest, low inspiratory volumes, pursed lip breathing, no accessory muscle use.  Not in respiratory distress. GI: soft, + tenderness over the upper abdomen with rectus diastasis, with active bowel sounds normal character. No guarding or palpable organomegaly noted.  Not tympanitic   Labs:     Latest Ref Rng & Units 12/14/2021    2:18 AM 12/13/2021    1:33 AM 12/12/2021    1:00 AM  CBC  WBC 4.0 - 10.5 K/uL 13.7  15.0  9.5   Hemoglobin 13.0 - 17.0 g/dL 15.0  14.5  15.1   Hematocrit 39.0 - 52.0 % 50.7  48.9  49.2   Platelets 150 - 400 K/uL 164  167  168      Assessment: Encounter Diagnoses  Name Primary?   Abdominal bloating Yes   Anemia due to chronic blood loss    Early satiety     Similar impression to last time I saw him, such that these chronic symptoms appear due to advanced COPD with lung hyperinflation, aerophagia from dyspnea and smoking. He has no other risk factors for his nor overall clinical picture of SIBO.  Bowel habits regular.  Abdominal wall tenderness over a rectus diastasis. I am afraid I have little to offer for this beyond the use of Gas-X.  Greatly doubt additional imaging or endoscopic evaluation would be revealing for other causes. He also seems disinclined to quit smoking.  Fortunately, chronic iron therapy has helped his hemoglobin remained normal from the standpoint of chronic GI blood loss.  No other testing or treatment planned at this point, he can see me as needed.   25 minutes were spent on this encounter (including chart review, history/exam,  counseling/coordination of care, and documentation) > 50% of that time was spent on counseling and coordination of care.   Nelida Meuse III  CC: Referring provider noted above

## 2022-02-13 NOTE — Patient Instructions (Signed)
_______________________________________________________  If you are age 68 or older, your body mass index should be between 23-30. Your Body mass index is 30.08 kg/m. If this is out of the aforementioned range listed, please consider follow up with your Primary Care Provider.  If you are age 59 or younger, your body mass index should be between 19-25. Your Body mass index is 30.08 kg/m. If this is out of the aformentioned range listed, please consider follow up with your Primary Care Provider.   ________________________________________________________  The [facility] providers would like to encourage you to use [facility] to communicate with providers for non-urgent requests or questions.  Due to long hold times on the telephone, sending your provider a message by [facility] may be a faster and more efficient way to get a response.  Please allow 48 business hours for a response.  Please remember that this is for non-urgent requests.  _______________________________________________________  Use Gas X over the counter for bloating.  It was a pleasure to see you today!  Thank you for trusting me with your gastrointestinal care!

## 2022-02-20 ENCOUNTER — Ambulatory Visit: Payer: Self-pay | Admitting: Licensed Clinical Social Worker

## 2022-02-27 ENCOUNTER — Encounter: Payer: Self-pay | Admitting: Licensed Clinical Social Worker

## 2022-02-27 ENCOUNTER — Telehealth: Payer: Self-pay | Admitting: Licensed Clinical Social Worker

## 2022-02-27 NOTE — Patient Outreach (Signed)
  Care Coordination   02/27/2022 Name: Mcgregor Tinnon MRN: [id number] DOB: 1956-02-25   Care Coordination Outreach Attempts:  An unsuccessful telephone outreach was attempted today. LCSW left a detailed message requesting a return call.  Follow Up Plan:  LCSW will await for a return call.   Encounter Outcome:  No Answer  Care Coordination Interventions Activated:  No   Care Coordination Interventions:  No, not indicated    Christa See, MSW, [email or web address] Phone [phone] 10:04 AM

## 2022-02-27 NOTE — Patient Instructions (Signed)
Visit Information  Thank you for taking time to visit with me today. Please don't hesitate to contact me if I can be of assistance to you.   Following are the goals we discussed today:   Goals Addressed             This Visit's Progress    Care Coordination Activities   On track    Care Coordination Interventions: Active listening / Reflection utilized  Emotional Support Provided LCSW informed pt of care coordination services. Pt shared interest and initial appt scheduled for 02/27/22 LCSW discussed flu shot LCSW reviewed upcoming appts        Our next appointment is by telephone on 02/27/22 at 10 AM  Please call the care guide team at [phone] if you need to cancel or reschedule your appointment.   If you are experiencing a Mental Health or [place] or need someone to talk to, please call the Suicide and Crisis Lifeline: 988 call 911   Patient verbalizes understanding of instructions and care plan provided today and agrees to view in [place]. Active MyChart status and patient understanding of how to access instructions and care plan via MyChart confirmed with patient.     Christa See, MSW, [email or web address] Phone [phone] 11:16 AM

## 2022-02-27 NOTE — Patient Outreach (Signed)
  Care Coordination   Initial Visit Note   02/27/2022 Name: Eli Pattillo MRN: [id number] DOB: 25-Oct-1955  Zacharius Funari is a 66 y.o. year old male who sees Charlott Rakes, MD for primary care. I spoke with  Barth Kirks by phone today.  What matters to the patients health and wellness today?  Informed pt of Care Coordination Services    Goals Addressed             This Visit's Progress    Care Coordination Activities   On track    Care Coordination Interventions: Active listening / Reflection utilized  Emotional Support Provided LCSW informed pt of care coordination services. Pt shared interest and initial appt scheduled for 02/27/22 LCSW discussed flu shot LCSW reviewed upcoming appts        SDOH assessments and interventions completed:  No     Care Coordination Interventions Activated:  Yes  Care Coordination Interventions:  Yes, provided   Follow up plan: Follow up call scheduled for 02/27/22    Encounter Outcome:  Pt. Visit Completed   Christa See, MSW, [email or web address] Phone [phone] 11:16 AM

## 2022-03-21 ENCOUNTER — Other Ambulatory Visit: Payer: Self-pay

## 2022-03-21 ENCOUNTER — Encounter (HOSPITAL_COMMUNITY): Payer: Self-pay

## 2022-03-21 ENCOUNTER — Emergency Department (HOSPITAL_COMMUNITY): Payer: Medicare HMO

## 2022-03-21 ENCOUNTER — Observation Stay (HOSPITAL_COMMUNITY)
Admission: EM | Admit: 2022-03-21 | Discharge: 2022-03-24 | Disposition: A | Discharge status: Home / Self Care | Payer: Medicare HMO | Attending: Internal Medicine | Admitting: Internal Medicine

## 2022-03-21 DIAGNOSIS — J441 Chronic obstructive pulmonary disease with (acute) exacerbation: Secondary | ICD-10-CM | POA: Diagnosis present

## 2022-03-21 DIAGNOSIS — I480 Paroxysmal atrial fibrillation: Secondary | ICD-10-CM | POA: Diagnosis not present

## 2022-03-21 DIAGNOSIS — D649 Anemia, unspecified: Secondary | ICD-10-CM | POA: Diagnosis not present

## 2022-03-21 DIAGNOSIS — F17210 Nicotine dependence, cigarettes, uncomplicated: Secondary | ICD-10-CM | POA: Diagnosis present

## 2022-03-21 DIAGNOSIS — E119 Type 2 diabetes mellitus without complications: Secondary | ICD-10-CM

## 2022-03-21 DIAGNOSIS — X58XXXA Exposure to other specified factors, initial encounter: Secondary | ICD-10-CM | POA: Insufficient documentation

## 2022-03-21 DIAGNOSIS — I4892 Unspecified atrial flutter: Secondary | ICD-10-CM | POA: Diagnosis not present

## 2022-03-21 DIAGNOSIS — R001 Bradycardia, unspecified: Secondary | ICD-10-CM | POA: Diagnosis not present

## 2022-03-21 DIAGNOSIS — I110 Hypertensive heart disease with heart failure: Secondary | ICD-10-CM

## 2022-03-21 DIAGNOSIS — Z7984 Long term (current) use of oral hypoglycemic drugs: Secondary | ICD-10-CM | POA: Diagnosis not present

## 2022-03-21 DIAGNOSIS — R0602 Shortness of breath: Secondary | ICD-10-CM | POA: Diagnosis not present

## 2022-03-21 DIAGNOSIS — K2289 Other specified disease of esophagus: Secondary | ICD-10-CM | POA: Diagnosis not present

## 2022-03-21 DIAGNOSIS — Z20822 Contact with and (suspected) exposure to covid-19: Secondary | ICD-10-CM | POA: Insufficient documentation

## 2022-03-21 DIAGNOSIS — J9611 Chronic respiratory failure with hypoxia: Secondary | ICD-10-CM | POA: Diagnosis present

## 2022-03-21 DIAGNOSIS — Z7982 Long term (current) use of aspirin: Secondary | ICD-10-CM | POA: Diagnosis not present

## 2022-03-21 DIAGNOSIS — I2510 Atherosclerotic heart disease of native coronary artery without angina pectoris: Secondary | ICD-10-CM | POA: Diagnosis present

## 2022-03-21 DIAGNOSIS — Z7901 Long term (current) use of anticoagulants: Secondary | ICD-10-CM | POA: Diagnosis not present

## 2022-03-21 DIAGNOSIS — K31819 Angiodysplasia of stomach and duodenum without bleeding: Secondary | ICD-10-CM

## 2022-03-21 DIAGNOSIS — R531 Weakness: Secondary | ICD-10-CM

## 2022-03-21 DIAGNOSIS — T182XXA Foreign body in stomach, initial encounter: Secondary | ICD-10-CM | POA: Insufficient documentation

## 2022-03-21 DIAGNOSIS — K5520 Angiodysplasia of colon without hemorrhage: Secondary | ICD-10-CM

## 2022-03-21 DIAGNOSIS — Z79899 Other long term (current) drug therapy: Secondary | ICD-10-CM | POA: Diagnosis not present

## 2022-03-21 DIAGNOSIS — I5042 Chronic combined systolic (congestive) and diastolic (congestive) heart failure: Secondary | ICD-10-CM | POA: Insufficient documentation

## 2022-03-21 DIAGNOSIS — K921 Melena: Secondary | ICD-10-CM | POA: Diagnosis not present

## 2022-03-21 DIAGNOSIS — I10 Essential (primary) hypertension: Secondary | ICD-10-CM | POA: Diagnosis present

## 2022-03-21 DIAGNOSIS — Z951 Presence of aortocoronary bypass graft: Secondary | ICD-10-CM | POA: Insufficient documentation

## 2022-03-21 DIAGNOSIS — I420 Dilated cardiomyopathy: Secondary | ICD-10-CM | POA: Diagnosis present

## 2022-03-21 DIAGNOSIS — K922 Gastrointestinal hemorrhage, unspecified: Secondary | ICD-10-CM

## 2022-03-21 DIAGNOSIS — J449 Chronic obstructive pulmonary disease, unspecified: Secondary | ICD-10-CM | POA: Diagnosis present

## 2022-03-21 LAB — URINALYSIS, COMPLETE (UACMP) WITH MICROSCOPIC
Bacteria, UA: NONE SEEN
Bilirubin Urine: NEGATIVE
Glucose, UA: NEGATIVE mg/dL
Hgb urine dipstick: NEGATIVE
Ketones, ur: NEGATIVE mg/dL
Leukocytes,Ua: NEGATIVE
Nitrite: NEGATIVE
Protein, ur: NEGATIVE mg/dL
Specific Gravity, Urine: 1.016 (ref 1.005–1.030)
pH: 5 (ref 5.0–8.0)

## 2022-03-21 LAB — BASIC METABOLIC PANEL
Anion gap: 7 (ref 5–15)
BUN: 9 mg/dL (ref 8–23)
CO2: 33 mmol/L — ABNORMAL HIGH (ref 22–32)
Calcium: 8.8 mg/dL — ABNORMAL LOW (ref 8.9–10.3)
Chloride: 101 mmol/L (ref 98–111)
Creatinine, Ser: 0.84 mg/dL (ref 0.61–1.24)
GFR, Estimated: >60 mL/min (ref >60)
Glucose, Bld: 117 mg/dL — ABNORMAL HIGH (ref 70–99)
Potassium: 3.6 mmol/L (ref 3.5–5.1)
Sodium: 141 mmol/L (ref 135–145)

## 2022-03-21 LAB — CBC WITH DIFFERENTIAL/PLATELET
Basophils Absolute: 0.1 10*3/uL (ref 0.0–0.1)
Basophils Relative: 2 %
Eosinophils Absolute: 0.1 10*3/uL (ref 0.0–0.5)
Eosinophils Relative: 2 %
HCT: 22.3 % — ABNORMAL LOW (ref 39.0–52.0)
Hemoglobin: 6 g/dL — CL (ref 13.0–17.0)
Immature Granulocytes: 0 %
Lymphocytes Relative: 16 %
Lymphs Abs: 1 10*3/uL (ref 0.7–4.0)
MCH: 20.7 pg — ABNORMAL LOW (ref 26.0–34.0)
MCHC: 26.9 g/dL — ABNORMAL LOW (ref 30.0–36.0)
MCV: 76.9 fL — ABNORMAL LOW (ref 80.0–100.0)
Monocytes Absolute: 0.1 10*3/uL (ref 0.1–1.0)
Monocytes Relative: 1 %
Neutro Abs: 4.9 10*3/uL (ref 1.7–7.7)
Neutrophils Relative %: 79 %
Platelets: 192 10*3/uL (ref 150–400)
RBC: 2.9 MIL/uL — ABNORMAL LOW (ref 4.22–5.81)
RDW: 21.8 % — ABNORMAL HIGH (ref 11.5–15.5)
WBC: 6.2 10*3/uL (ref 4.0–10.5)
nRBC: 0.3 % — ABNORMAL HIGH (ref 0.0–0.2)
nRBC: 1 /100 WBC — ABNORMAL HIGH

## 2022-03-21 LAB — MAGNESIUM: Magnesium: 1.9 mg/dL (ref 1.7–2.4)

## 2022-03-21 LAB — CBC
HCT: 23.5 % — ABNORMAL LOW (ref 39.0–52.0)
HCT: 25 % — ABNORMAL LOW (ref 39.0–52.0)
Hemoglobin: 6 g/dL — CL (ref 13.0–17.0)
Hemoglobin: 7.1 g/dL — ABNORMAL LOW (ref 13.0–17.0)
MCH: 20.1 pg — ABNORMAL LOW (ref 26.0–34.0)
MCH: 22.5 pg — ABNORMAL LOW (ref 26.0–34.0)
MCHC: 25.5 g/dL — ABNORMAL LOW (ref 30.0–36.0)
MCHC: 28.4 g/dL — ABNORMAL LOW (ref 30.0–36.0)
MCV: 78.9 fL — ABNORMAL LOW (ref 80.0–100.0)
MCV: 79.1 fL — ABNORMAL LOW (ref 80.0–100.0)
Platelets: 197 10*3/uL (ref 150–400)
Platelets: 186 10*3/uL (ref 150–400)
RBC: 2.98 MIL/uL — ABNORMAL LOW (ref 4.22–5.81)
RBC: 3.16 MIL/uL — ABNORMAL LOW (ref 4.22–5.81)
RDW: 22.1 % — ABNORMAL HIGH (ref 11.5–15.5)
RDW: 22 % — ABNORMAL HIGH (ref 11.5–15.5)
WBC: 6.4 10*3/uL (ref 4.0–10.5)
WBC: 6 10*3/uL (ref 4.0–10.5)
nRBC: 0 % (ref 0.0–0.2)
nRBC: 0.3 % — ABNORMAL HIGH (ref 0.0–0.2)

## 2022-03-21 LAB — IRON AND TIBC
Iron: 18 ug/dL — ABNORMAL LOW (ref 45–182)
Saturation Ratios: 4 % — ABNORMAL LOW (ref 17.9–39.5)
TIBC: 410 ug/dL (ref 250–450)
UIBC: 392 ug/dL

## 2022-03-21 LAB — TSH: TSH: 0.373 u[IU]/mL (ref 0.350–4.500)

## 2022-03-21 LAB — BRAIN NATRIURETIC PEPTIDE: B Natriuretic Peptide: 379.5 pg/mL — ABNORMAL HIGH (ref 0.0–100.0)

## 2022-03-21 LAB — PREPARE RBC (CROSSMATCH)

## 2022-03-21 LAB — CBG MONITORING, ED
Glucose-Capillary: 136 mg/dL — ABNORMAL HIGH (ref 70–99)
Glucose-Capillary: 220 mg/dL — ABNORMAL HIGH (ref 70–99)

## 2022-03-21 LAB — POC OCCULT BLOOD, ED: Fecal Occult Bld: NEGATIVE

## 2022-03-21 LAB — CK: Total CK: 82 U/L (ref 49–397)

## 2022-03-21 LAB — HEMOGLOBIN A1C
Hgb A1c MFr Bld: 5.6 % (ref 4.8–5.6)
Mean Plasma Glucose: 114.02 mg/dL

## 2022-03-21 LAB — RESP PANEL BY RT-PCR (FLU A&B, COVID) ARPGX2
Influenza A by PCR: NEGATIVE
Influenza B by PCR: NEGATIVE
SARS Coronavirus 2 by RT PCR: NEGATIVE

## 2022-03-21 LAB — PROTIME-INR
INR: 1.1 (ref 0.8–1.2)
Prothrombin Time: 14.4 seconds (ref 11.4–15.2)

## 2022-03-21 LAB — TROPONIN I (HIGH SENSITIVITY)
Troponin I (High Sensitivity): 24 ng/L — ABNORMAL HIGH (ref <18)
Troponin I (High Sensitivity): 21 ng/L — ABNORMAL HIGH (ref <18)

## 2022-03-21 LAB — VITAMIN B12: Vitamin B-12: 1447 pg/mL — ABNORMAL HIGH (ref 180–914)

## 2022-03-21 LAB — FERRITIN: Ferritin: 3 ng/mL — ABNORMAL LOW (ref 24–336)

## 2022-03-21 MED ORDER — METHYLPREDNISOLONE SODIUM SUCC 125 MG IJ SOLR
125.0000 mg | Freq: Once | INTRAMUSCULAR | Status: AC
  Administered 2022-03-22: 125 mg via INTRAVENOUS
  Filled 2022-03-21: qty 2

## 2022-03-21 MED ORDER — PANTOPRAZOLE SODIUM 40 MG IV SOLR
40.0000 mg | Freq: Two times a day (BID) | INTRAVENOUS | Status: DC
  Administered 2022-03-21 – 2022-03-24 (×6): 40 mg via INTRAVENOUS
  Filled 2022-03-21 (×6): qty 10

## 2022-03-21 MED ORDER — INSULIN ASPART 100 UNIT/ML IJ SOLN
0.0000 [IU] | Freq: Three times a day (TID) | INTRAMUSCULAR | Status: DC
  Administered 2022-03-22: 3 [IU] via SUBCUTANEOUS
  Administered 2022-03-22 – 2022-03-23 (×2): 1 [IU] via SUBCUTANEOUS
  Administered 2022-03-23 (×2): 2 [IU] via SUBCUTANEOUS

## 2022-03-21 MED ORDER — SODIUM CHLORIDE 0.9 % IV SOLN: 250.0000 mL | INTRAVENOUS | Status: DC | PRN

## 2022-03-21 MED ORDER — GABAPENTIN 300 MG PO CAPS
300.0000 mg | ORAL_CAPSULE | Freq: Every day | ORAL | Status: DC
  Administered 2022-03-21 – 2022-03-23 (×3): 300 mg via ORAL
  Filled 2022-03-21 (×3): qty 1

## 2022-03-21 MED ORDER — IPRATROPIUM-ALBUTEROL 0.5-2.5 (3) MG/3ML IN SOLN
3.0000 mL | Freq: Four times a day (QID) | RESPIRATORY_TRACT | Status: DC
  Administered 2022-03-21 – 2022-03-22 (×3): 3 mL via RESPIRATORY_TRACT
  Filled 2022-03-21 (×3): qty 3

## 2022-03-21 MED ORDER — BUDESON-GLYCOPYRROL-FORMOTEROL 160-9-4.8 MCG/ACT IN AERO: 2.0000 | INHALATION_SPRAY | Freq: Two times a day (BID) | RESPIRATORY_TRACT | Status: DC

## 2022-03-21 MED ORDER — EZETIMIBE 10 MG PO TABS
10.0000 mg | ORAL_TABLET | Freq: Every day | ORAL | Status: DC
  Administered 2022-03-21 – 2022-03-24 (×4): 10 mg via ORAL
  Filled 2022-03-21 (×5): qty 1

## 2022-03-21 MED ORDER — LEVALBUTEROL HCL 0.63 MG/3ML IN NEBU
0.6300 mg | INHALATION_SOLUTION | Freq: Four times a day (QID) | RESPIRATORY_TRACT | Status: DC | PRN
  Administered 2022-03-23: 0.63 mg via RESPIRATORY_TRACT
  Filled 2022-03-21: qty 3

## 2022-03-21 MED ORDER — ACETAMINOPHEN 325 MG PO TABS
650.0000 mg | ORAL_TABLET | Freq: Four times a day (QID) | ORAL | Status: DC | PRN
  Administered 2022-03-24: 650 mg via ORAL
  Filled 2022-03-21: qty 2

## 2022-03-21 MED ORDER — SODIUM CHLORIDE 0.9% FLUSH
3.0000 mL | Freq: Two times a day (BID) | INTRAVENOUS | Status: DC
  Administered 2022-03-22 – 2022-03-24 (×5): 3 mL via INTRAVENOUS

## 2022-03-21 MED ORDER — SODIUM CHLORIDE 0.9% FLUSH: 3.0000 mL | INTRAVENOUS | Status: DC | PRN

## 2022-03-21 MED ORDER — NICOTINE 14 MG/24HR TD PT24
14.0000 mg | MEDICATED_PATCH | Freq: Every day | TRANSDERMAL | Status: DC
  Administered 2022-03-21 – 2022-03-24 (×4): 14 mg via TRANSDERMAL
  Filled 2022-03-21 (×4): qty 1

## 2022-03-21 MED ORDER — ATORVASTATIN CALCIUM 80 MG PO TABS
80.0000 mg | ORAL_TABLET | Freq: Every day | ORAL | Status: DC
  Administered 2022-03-22 – 2022-03-24 (×3): 80 mg via ORAL
  Filled 2022-03-21 (×3): qty 1

## 2022-03-21 MED ORDER — SODIUM CHLORIDE 0.9% IV SOLUTION: Freq: Once | INTRAVENOUS | Status: DC

## 2022-03-21 MED ORDER — IPRATROPIUM-ALBUTEROL 0.5-2.5 (3) MG/3ML IN SOLN
3.0000 mL | RESPIRATORY_TRACT | Status: AC
  Administered 2022-03-21 (×3): 3 mL via RESPIRATORY_TRACT
  Filled 2022-03-21: qty 9

## 2022-03-21 MED ORDER — ACETAMINOPHEN 650 MG RE SUPP: 650.0000 mg | Freq: Four times a day (QID) | RECTAL | Status: DC | PRN

## 2022-03-21 MED ORDER — FUROSEMIDE 10 MG/ML IJ SOLN
20.0000 mg | Freq: Once | INTRAMUSCULAR | Status: AC
  Administered 2022-03-21: 20 mg via INTRAVENOUS
  Filled 2022-03-21: qty 2

## 2022-03-21 MED ORDER — SODIUM CHLORIDE 0.9 % IV SOLN
10.0000 mL/h | Freq: Once | INTRAVENOUS | Status: AC
  Administered 2022-03-21: 10 mL/h via INTRAVENOUS

## 2022-03-21 MED ORDER — IPRATROPIUM-ALBUTEROL 0.5-2.5 (3) MG/3ML IN SOLN: 3.0000 mL | Freq: Three times a day (TID) | RESPIRATORY_TRACT | Status: DC

## 2022-03-21 NOTE — Assessment & Plan Note (Addendum)
In NSR Holding coreg with hypotension  Hold xarelto in setting of acute on chronic anemia

## 2022-03-21 NOTE — ED Triage Notes (Signed)
Patient coming from home. Patient states he has a hx of COPD and CHF. He has been complaint with medications and has PRN oxygen that he has been requiring more often. For EMS patient was diminsihed in uppers and exp wheezes in bases. Patient received 0.5 atrovent, 125 solumedrol, and 5 of albuterol. Patient is A&Ox4

## 2022-03-21 NOTE — H&P (Signed)
History and Physical    Patient: Jared [name] BDZ:[id number] DOB: 1955/11/26 DOA: 03/21/2022 DOS: the patient was seen and examined on 03/21/2022 PCP: Charlott Rakes, MD  Patient coming from: Home - lives alone. Uses a walker to ambulate.    Chief Complaint: shortness of breath and fatigue/weakness.   HPI: Lamarco Gudiel is a 66 y.o. male with medical history significant of CAD with hx of CABG, combined systolic and diastolic CHF, chronic respiratory failure on as needed oxygen at 2L at home, COPD, T2DM, hx of CVA, HTN, atrial flutter, IDA, tobacco abuse who presented to ED with complaints of shortness of breath and fatigue x 1 week. He states he has been having to use his oxygen 24/7. He states he has been so weak and this morning he was so weak he couldn't take a bath or go cook himself breakfast. His ex-wife cooked him breakfast and he still didn't feel better so he called Ems. He states he doesn't feel like he has tightness or worse wheezing. He has no worsening cough and denies increased sputum production or increased viscosity. He denies any NSAID use, dark/tarry stool, N/V/D or abdominal pain. He also states his legs have been weak for 1 months and he can't stand for longer than 5 minutes without having to sit down and rest.   He smoke about 10 cigarettes/day, no alcohol use.    History of GI bleed secondary to gastric ulcer from h.pylori infection and NsAID use s/p EGD with endoclips on 11/16/2020. Regarding the incidental biopsy finding of focal intestinal metaplasia without dysplasia, he needs a repeat upper endoscopy in 3 years to take surveillance biopsies.   Denies any fever/chills, vision changes/headaches, chest pain or palpitations, abdominal pain, N/V/D, dysuria or leg swelling.   He is on xarelto and last took last night.   ER Course:  vitals: afebrile, bp: 110/64, HR: 90, RR: 18, oxygen: 100%RA Pertinent labs: bnp: 379, hgb: 6.0, MCV: 76.9, fecal occult: negative  CXR: no  acute finding In ED: given duoneb and 1 unit of PRBC ordered. [facility] asked to admit.  With EMS got solumedrol and duonebs.     Review of Systems: As mentioned in the history of present illness. All other systems reviewed and are negative. Past Medical History:  Diagnosis Date   AVM (arteriovenous malformation)    CAD (coronary artery disease)    a. LHC 5/12:  LAD 20, pLCx 20, pRCA 40, dRCA 40, EF 35%, diff HK  //  b. Myoview 4/16: Overall Impression:  High risk stress nuclear study There is no evidence of ischemia.  There is severe LV dysfunction. LV Ejection Fraction: 30%.  LV Wall Motion:  There is global LV hypokinesis.     CAP (community acquired pneumonia) 09/2013   Chronic combined systolic and diastolic CHF (congestive heart failure) ([place])    a. Echo 4/16:Mild LVH, EF 40-45%, diffuse HK //  b. Echo 8/17: EF 35-40%, diffuse HK, diastolic dysfunction, aortic sclerosis, trivial MR, moderate LAE, normal RVSF, moderate RAE, mild TR, PASP 42 mmHg // c. Echo 4/18: Mild concentric LVH, EF 30-35, normal wall motion, grade 1 diastolic dysfunction, PASP 49   Chronic respiratory failure (HCC)    Cluster headache    "hx; haven't had one in awhile" (01/09/2016)   COPD (chronic obstructive pulmonary disease) ([place])    [name] 01/09/2016   DM (diabetes mellitus) ([place])    History of CVA (cerebrovascular accident)    Hypertension    IDA (iron deficiency anemia)  Moderate tobacco use disorder    NICM (nonischemic cardiomyopathy) ([place])    Nicotine addiction    Tobacco abuse    Past Surgical History:  Procedure Laterality Date   BIOPSY  11/12/2020   Procedure: BIOPSY;  Surgeon: Irving Copas., MD;  Location: [facility];  Service: Gastroenterology;;   CARDIAC CATHETERIZATION  10/2010   LM normal, LAD with 20% irregularities, LCX with 20%, RCA with 40% prox and 40% distal - EF of 35%   CATARACT EXTRACTION, BILATERAL     COLONOSCOPY W/ BIOPSIES AND POLYPECTOMY     COLONOSCOPY WITH PROPOFOL N/A  09/06/2018   Procedure: COLONOSCOPY WITH PROPOFOL;  Surgeon: Thornton Park, MD;  Location: [place];  Service: Gastroenterology;  Laterality: N/A;   ENTEROSCOPY N/A 09/28/2018   Procedure: ENTEROSCOPY;  Surgeon: Carol Ada, MD;  Location: [place];  Service: Endoscopy;  Laterality: N/A;   ENTEROSCOPY N/A 10/28/2018   Procedure: ENTEROSCOPY;  Surgeon: Thornton Park, MD;  Location: [place];  Service: Gastroenterology;  Laterality: N/A;   ENTEROSCOPY N/A 10/09/2020   Procedure: ENTEROSCOPY;  Surgeon: Irene Shipper, MD;  Location: [facility];  Service: Endoscopy;  Laterality: N/A;   ESOPHAGOGASTRODUODENOSCOPY N/A 11/12/2020   Procedure: ESOPHAGOGASTRODUODENOSCOPY (EGD);  Surgeon: Irving Copas., MD;  Location: [facility];  Service: Gastroenterology;  Laterality: N/A;   ESOPHAGOGASTRODUODENOSCOPY (EGD) WITH PROPOFOL N/A 09/05/2018   Procedure: ESOPHAGOGASTRODUODENOSCOPY (EGD) WITH PROPOFOL;  Surgeon: Yetta Flock, MD;  Location: [employer];  Service: Gastroenterology;  Laterality: N/A;   ESOPHAGOGASTRODUODENOSCOPY (EGD) WITH PROPOFOL N/A 11/19/2020   Procedure: ESOPHAGOGASTRODUODENOSCOPY (EGD) WITH PROPOFOL;  Surgeon: Jerene Bears, MD;  Location: [facility];  Service: Gastroenterology;  Laterality: N/A;   EXCISION MASS HEAD     GIVENS CAPSULE STUDY N/A 09/06/2018   Procedure: GIVENS CAPSULE STUDY;  Surgeon: Thornton Park, MD;  Location: [place];  Service: Gastroenterology;  Laterality: N/A;   GIVENS CAPSULE STUDY N/A 09/26/2018   Procedure: GIVENS CAPSULE STUDY;  Surgeon: Jerene Bears, MD;  Location: [place];  Service: Gastroenterology;  Laterality: N/A;   GIVENS CAPSULE STUDY N/A 06/14/2019   Procedure: GIVENS CAPSULE STUDY;  Surgeon: Mauri Pole, MD;  Location: [place] [facility];  Service: Endoscopy;  Laterality: N/A;   HEMOSTASIS CLIP PLACEMENT  11/12/2020   Procedure: HEMOSTASIS CLIP PLACEMENT;  Surgeon: Irving Copas., MD;  Location:  [facility];  Service: Gastroenterology;;   HEMOSTASIS CONTROL  11/12/2020   Procedure: HEMOSTASIS CONTROL;  Surgeon: Irving Copas., MD;  Location: [facility];  Service: Gastroenterology;;   HOT HEMOSTASIS N/A 10/28/2018   Procedure: HOT HEMOSTASIS (ARGON PLASMA COAGULATION/BICAP);  Surgeon: Thornton Park, MD;  Location: [place];  Service: Gastroenterology;  Laterality: N/A;   HOT HEMOSTASIS N/A 11/12/2020   Procedure: HOT HEMOSTASIS (ARGON PLASMA COAGULATION/BICAP);  Surgeon: Irving Copas., MD;  Location: [facility];  Service: Gastroenterology;  Laterality: N/A;   INCISION AND DRAINAGE PERIRECTAL ABSCESS N/A 06/05/2017   Procedure: IRRIGATION AND DEBRIDEMENT PERIRECTAL ABSCESS;  Surgeon: Ileana Roup, MD;  Location: [place];  Service: General;  Laterality: N/A;   SUBMUCOSAL TATTOO INJECTION  11/12/2020   Procedure: SUBMUCOSAL TATTOO INJECTION;  Surgeon: Irving Copas., MD;  Location: [facility];  Service: Gastroenterology;;   VIDEO BRONCHOSCOPY Bilateral 05/08/2016   Procedure: VIDEO BRONCHOSCOPY WITH FLUORO;  Surgeon: Rigoberto Noel, MD;  Location: [place];  Service: Cardiopulmonary;  Laterality: Bilateral;   Social History:  reports that he has been smoking cigarettes. He has a 23.50 pack-year smoking history. He has never  used smokeless tobacco. He reports that he does not currently use alcohol. He reports that he does not use drugs.  Allergies  Allergen Reactions   Lisinopril Cough    Family History  Problem Relation Age of Onset   Heart disease Mother    Diabetes Mother    Colon cancer Mother    Liver cancer Mother    Cancer Father        type unknown   Diabetes Sister        x 2   Diabetes Brother     Prior to Admission medications   Medication Sig Start Date End Date Taking? Authorizing Provider  Accu-Chek Softclix Lancets lancets Use as instructed daily before meals 12/18/21   Charlott Rakes, MD  albuterol (PROVENTIL) (2.5  MG/3ML) 0.083% nebulizer solution Take 3 mLs (2.5 mg total) by nebulization every 6 (six) hours as needed for wheezing or shortness of breath. 12/18/21   Charlott Rakes, MD  albuterol (VENTOLIN HFA) 108 (90 Base) MCG/ACT inhaler INHALE 2 PUFFS INTO THE LUNGS EVERY 6 (SIX) HOURS AS NEEDED FOR WHEEZING OR SHORTNESS OF BREATH. Patient taking differently: Inhale 2 puffs into the lungs 3 (three) times daily as needed for wheezing or shortness of breath. 09/20/21 09/20/22  Charlott Rakes, MD  Aspirin Effervescent (ALKA-SELTZER ORIGINAL PO) Take 1 packet by mouth 2 (two) times daily as needed (bloat).    [provider]  atorvastatin (LIPITOR) 80 MG tablet Take 1 tablet (80 mg total) by mouth daily. 11/08/21   Charlott Rakes, MD  Blood Glucose Monitoring Suppl (ACCU-CHEK GUIDE) w/Device KIT Use as directed three times daily 12/18/21   Charlott Rakes, MD  Budeson-Glycopyrrol-Formoterol (BREZTRI AEROSPHERE) 160-9-4.8 MCG/ACT AERO Take 2 puffs first thing in morning and then another 2 puffs about 12 hours later. Patient taking differently: Inhale 2 puffs into the lungs in the morning and at bedtime. 12/06/21   Tanda Rockers, MD  carvedilol (COREG) 3.125 MG tablet Take 1 tablet (3.125 mg total) by mouth 2 (two) times daily with a meal. 12/18/21   Charlott Rakes, MD  ezetimibe (ZETIA) 10 MG tablet Take 1 tablet (10 mg total) by mouth daily. 01/31/22   Fay Records, MD  ferrous sulfate 325 (65 FE) MG tablet Take 1 tablet (325 mg total) by mouth 2 (two) times daily with a meal. 11/16/20   Nicole Kindred A, DO  fluticasone (FLONASE) 50 MCG/ACT nasal spray Place 1 spray into both nostrils daily. Patient not taking: Reported on 02/13/2022 02/02/22   Charlott Rakes, MD  furosemide (LASIX) 40 MG tablet Take 1 tablet (40 mg total) by mouth 2 (two) times daily. 12/18/21   Charlott Rakes, MD  gabapentin (NEURONTIN) 300 MG capsule TAKE 1 CAPSULE (300 MG TOTAL) BY MOUTH AT BEDTIME. 02/02/22 02/02/23  Charlott Rakes, MD   glucose blood (ACCU-CHEK GUIDE) test strip Use as instructed daily 12/18/21   Charlott Rakes, MD  hydrALAZINE (APRESOLINE) 50 MG tablet Take 1 tablet (50 mg total) by mouth every 8 (eight) hours. 12/18/21   Charlott Rakes, MD  isosorbide mononitrate (IMDUR) 30 MG 24 hr tablet Take 1 tablet (30 mg total) by mouth daily. 12/18/21   Charlott Rakes, MD  metFORMIN (GLUCOPHAGE) 500 MG tablet Take 1 tablet (500 mg total) by mouth daily with breakfast. 12/18/21   Charlott Rakes, MD  Multiple Vitamin (MULTIVITAMIN WITH MINERALS) TABS tablet Take 1 tablet by mouth daily. 11/16/20   Nicole Kindred A, DO  naproxen sodium (ALEVE) 220 MG tablet Take 440 mg  by mouth 2 (two) times daily as needed (headache).    [provider]  nitroGLYCERIN (NITROSTAT) 0.4 MG SL tablet Place 1 tablet (0.4 mg total) under the tongue every 5 (five) minutes as needed for chest pain. 11/19/20 02/13/22  Florencia Reasons, MD  OXYGEN Inhale 2 L/min into the lungs as directed. On exertion or when walking    [provider]  SALINE NA Place 2 sprays into the nose 2 (two) times daily as needed (dryness).    [provider]  traMADol (ULTRAM) 50 MG tablet Take 1 tablet (50 mg total) by mouth every 6 (six) hours as needed for moderate pain. 02/11/21   Domenic Polite, MD  triamcinolone cream (KENALOG) 0.1 % Apply 1 application. topically 2 (two) times daily. Patient taking differently: Apply 1 application  topically See admin instructions. 1 application 1-2 times a day as needed for rash on elbow 09/20/21   Freeman Caldron M, PA-C  XARELTO 20 MG TABS tablet TAKE 1 TABLET EVERY DAY WITH SUPPER (MUST HAVE OFFICE VISIT FOR REFILLS) Patient taking differently: Take 20 mg by mouth daily with supper. 11/08/21   Charlott Rakes, MD  TUDORZA PRESSAIR 400 MCG/ACT AEPB INHALE 1 PUFF INTO THE LUNGS IN THE MORNING AND AT BEDTIME. 03/29/20 04/04/20  Tanda Rockers, MD    Physical Exam: Vitals:   03/21/22 1750 03/21/22 1805 03/21/22 1830  03/21/22 1930  BP: 107/66 110/61 120/70 121/60  Pulse: 78 77    Resp: 16 18    Temp: 98.2 F (36.8 C) 98.3 F (36.8 C)    TempSrc: Oral Oral    SpO2: 100% 100%    Weight:      Height:       General:  Appears calm and comfortable and is in NAD Eyes:  PERRL, EOMI, normal lids, iris ENT:  grossly normal hearing, lips & tongue, mmm; appropriate dentition Neck:  no LAD, masses or thyromegaly; no carotid bruits Cardiovascular:  RRR, no m/r/g. No LE edema.  Respiratory:   occasional expiratory wheeze. Poor air movement throughout. No crackles. Mild dyspnea.  Abdomen:  soft, NT, ND, NABS Back:   normal alignment, no CVAT Skin:  no rash or induration seen on limited exam Musculoskeletal:  grossly normal tone BUE/BLE, good ROM, no bony abnormality Lower extremity:  No LE edema.  Limited foot exam with no ulcerations.  2+ distal pulses. Psychiatric:  grossly normal mood and affect, speech fluent and appropriate, AOx3 Neurologic:  CN 2-12 grossly intact, moves all extremities in coordinated fashion, sensation intact   Radiological Exams on Admission: Independently reviewed - see discussion in A/P where applicable  DG Chest 2 View  Result Date: 03/21/2022 CLINICAL DATA:  Shortness of breath EXAM: CHEST - 2 VIEW COMPARISON:  Chest x-ray dated January 03, 2022 FINDINGS: Unchanged cardiomegaly. Lungs are clear. No pleural effusion or pneumothorax. Old left-sided rib fractures. IMPRESSION: No acute cardiopulmonary abnormality. Electronically Signed   By: Yetta Glassman M.D.   On: 03/21/2022 13:32    EKG: Independently reviewed.  NSR with rate 91 with PAC; nonspecific ST changes with no evidence of acute ischemia Similar to previous.   Labs on Admission: I have personally reviewed the available labs and imaging studies at the time of the admission.  Pertinent labs:   bnp: 379,  hgb: 6.0,  MCV: 76.9,  fecal occult:   Assessment and Plan: Principal Problem:   Acute on chronic symptomatic  anemia Active Problems:   COPD with acute exacerbation ([place])  Nonischemic dilated cardiomyopathy (HCC)   Weakness   Chronic respiratory failure with hypoxia (HCC)   Essential hypertension   Atrial flutter (HCC)   Type 2 diabetes mellitus (HCC)   CAD (coronary artery disease)   Cigarette smoker    Assessment and Plan: * Acute on chronic symptomatic anemia 66 year old male with history of IDA with iron transfusions as well as upper GI leed in 12/2020 secondary to h.pylori and NSAID use who presented to ED with 1 weak history of fatigue/weakness and shortness of breath found to have acute on chronic anemia with hgb of 6.0. hgb of 15.0 three months ago.  -admit to progressive -fecal occult negative -no s/s of active bleeding. Denies dark/tarry stool, N/V/D, hematuria or other sources of bleeding -receiving 1 unit PRBC, transfuse to keep hgb >8 with cardiac history -hold xarelto. Last took 10/10 in the pm  -repeat H&H 2 hours post transfusion -check iron studies prior to transfusions -check UA  -has been taking aleve PRN  -okay with liquid diet since no active bleed -will give protonix 43m IV BID for now  -consult GI if repeat fecal occult positive or worsening hgb   COPD with acute exacerbation (HCC) Worsening shortness of breath x1 week and increased wheezing on exam. No change in sputum production/viscosity. No fever/chills or upper respiratory symptoms.  -not compliant with his breztri -received IV solumedrol today, continue for dose tomorrow>oral prednisone -scheduled duonebs -continue home breztri -prn xoponex -continue prn oxygen -sputum culture -no indication for abx at this time  -encouraged smoking cessation   Weakness Check TSH/B12/CK  Discussed most obvious issue would be his anemia causing weakness and fatigue Transfuse as per above PT to see   Nonischemic dilated cardiomyopathy ([place] 12/2021 EF of 45-50% with mildly decreased LVF and global hypokinesis.  Grade 1DD Appears euvolemic  bnp mildly elevated, but likely due to lung disease Strict I/O Holding home coreg/lasix in setting of hypotension and acute on chronic anemia Watch volume with blood transfusion and holding lasix   Chronic respiratory failure with hypoxia (HCC) At baseline of 2L prn, stable   Essential hypertension Soft to hypotensive readings Hold home coreg, lasix, imdur and hydralazine, he is actually unsure what is taking Receiving 1 unit PRBC, add back anti-hypertensive agents as allowed   Atrial flutter (HCC) In NSR Holding coreg with hypotension  Hold xarelto in setting of acute on chronic anemia   Type 2 diabetes mellitus (HCC) A1C in 11/2020 was 5.7 Update A1C Patient apparently not taking metformin  SSI and accuchecks qac/hs   CAD (coronary artery disease) No chest pain/palpitations. troponins flat at baseline Continue zetia/lipitor Hold coreg/xarelto in setting of hypotension and anemia.   Cigarette smoker No desire to quit Nicotine patch     Advance Care Planning:   Code Status: Full Code   Consults: PT  DVT Prophylaxis: TED/SCDs  Family Communication: none   Severity of Illness: The appropriate patient status for this patient is OBSERVATION. Observation status is judged to be reasonable and necessary in order to provide the required intensity of service to ensure the patient's safety. The patient's presenting symptoms, physical exam findings, and initial radiographic and laboratory data in the context of their medical condition is felt to place them at decreased risk for further clinical deterioration. Furthermore, it is anticipated that the patient will be medically stable for discharge from the hospital within 2 midnights of admission.   Author: AOrma Flaming MD 03/21/2022 8:11 PM  For on call review  [email or web address]

## 2022-03-21 NOTE — ED Notes (Signed)
Patient transported to X-ray 

## 2022-03-21 NOTE — Assessment & Plan Note (Addendum)
66 year old male with history of IDA with iron transfusions as well as upper GI leed in 12/2020 secondary to h.pylori and NSAID use who presented to ED with 1 weak history of fatigue/weakness and shortness of breath found to have acute on chronic anemia with hgb of 6.0. hgb of 15.0 three months ago.  -admit to progressive -fecal occult negative -no s/s of active bleeding. Denies dark/tarry stool, N/V/D, hematuria or other sources of bleeding -receiving 1 unit PRBC, transfuse to keep hgb >8 with cardiac history -hold xarelto. Last took 10/10 in the pm  -repeat H&H 2 hours post transfusion -check iron studies prior to transfusions -check UA  -has been taking aleve PRN  -okay with liquid diet since no active bleed -will give protonix 40mg  IV BID for now  -consult GI if repeat fecal occult positive or worsening hgb

## 2022-03-21 NOTE — Assessment & Plan Note (Addendum)
At baseline of 2L prn, stable

## 2022-03-21 NOTE — Assessment & Plan Note (Signed)
Worsening shortness of breath x1 week and increased wheezing on exam. No change in sputum production/viscosity. No fever/chills or upper respiratory symptoms.  -not compliant with his breztri -received IV solumedrol today, continue for dose tomorrow>oral prednisone -scheduled duonebs -continue home breztri -prn xoponex -continue prn oxygen -sputum culture -no indication for abx at this time  -encouraged smoking cessation

## 2022-03-21 NOTE — Assessment & Plan Note (Addendum)
Echo 12/2021 EF of 45-50% with mildly decreased LVF and global hypokinesis. Grade 1DD Appears euvolemic  bnp mildly elevated, but likely due to lung disease Strict I/O Holding home coreg/lasix in setting of hypotension and acute on chronic anemia Watch volume with blood transfusion and holding lasix

## 2022-03-21 NOTE — Assessment & Plan Note (Signed)
No chest pain/palpitations. troponins flat at baseline Continue zetia/lipitor Hold coreg/xarelto in setting of hypotension and anemia.

## 2022-03-21 NOTE — Assessment & Plan Note (Signed)
A1C in 11/2020 was 5.7 Update A1C Patient apparently not taking metformin  SSI and accuchecks qac/hs

## 2022-03-21 NOTE — ED Provider Notes (Signed)
[name] Provider Note   CSN: [id number] Arrival date & time: 03/21/22  1253     History  Chief Complaint  Patient presents with   Shortness of [name] is a 66 y.o. male history includes CHF, COPD, diabetes, hypertension, CAD, GI bleed.  Patient brought in by EMS today.  Patient presents today for evaluation of shortness of breath and fatigue, he reports his shortness of breath is chronic but has worsened over the past week he has been using albuterol nebulizers at home without improvement.  He reports wheezing which is constant it is associated with a mild left-sided chest pain that he describes as a pressure there are no aggravating or alleviating factors.  His shortness of breath is associated with a cough productive with white sputum.  He denies any extremity swelling, nausea, vomiting, abdominal pain, hemoptysis or any additional concerns.  Patient has had 5 mg albuterol and 125 mg Solu-Medrol without significant proving. HPI     Home Medications Prior to Admission medications   Medication Sig Start Date End Date Taking? Authorizing Provider  gabapentin (NEURONTIN) 300 MG capsule TAKE 1 CAPSULE (300 MG TOTAL) BY MOUTH AT BEDTIME. 02/02/22 02/02/23 Yes Newlin, Enobong, MD  isosorbide mononitrate (IMDUR) 30 MG 24 hr tablet Take 1 tablet (30 mg total) by mouth daily. 12/18/21  Yes Charlott Rakes, MD  naproxen sodium (ALEVE) 220 MG tablet Take 440 mg by mouth 2 (two) times daily as needed (headache).   Yes [provider]  Accu-Chek Softclix Lancets lancets Use as instructed daily before meals 12/18/21   Charlott Rakes, MD  albuterol (PROVENTIL) (2.5 MG/3ML) 0.083% nebulizer solution Take 3 mLs (2.5 mg total) by nebulization every 6 (six) hours as needed for wheezing or shortness of breath. 12/18/21   Charlott Rakes, MD  albuterol (VENTOLIN HFA) 108 (90 Base) MCG/ACT inhaler INHALE 2 PUFFS INTO THE LUNGS EVERY 6 (SIX)  HOURS AS NEEDED FOR WHEEZING OR SHORTNESS OF BREATH. Patient taking differently: Inhale 2 puffs into the lungs 3 (three) times daily as needed for wheezing or shortness of breath. 09/20/21 09/20/22  Charlott Rakes, MD  Aspirin Effervescent (ALKA-SELTZER ORIGINAL PO) Take 1 packet by mouth 2 (two) times daily as needed (bloat).    [provider]  atorvastatin (LIPITOR) 80 MG tablet Take 1 tablet (80 mg total) by mouth daily. 11/08/21   Charlott Rakes, MD  Blood Glucose Monitoring Suppl (ACCU-CHEK GUIDE) w/Device KIT Use as directed three times daily 12/18/21   Charlott Rakes, MD  Budeson-Glycopyrrol-Formoterol (BREZTRI AEROSPHERE) 160-9-4.8 MCG/ACT AERO Take 2 puffs first thing in morning and then another 2 puffs about 12 hours later. Patient taking differently: Inhale 2 puffs into the lungs in the morning and at bedtime. 12/06/21   Tanda Rockers, MD  carvedilol (COREG) 3.125 MG tablet Take 1 tablet (3.125 mg total) by mouth 2 (two) times daily with a meal. 12/18/21   Charlott Rakes, MD  ezetimibe (ZETIA) 10 MG tablet Take 1 tablet (10 mg total) by mouth daily. 01/31/22   Fay Records, MD  ferrous sulfate 325 (65 FE) MG tablet Take 1 tablet (325 mg total) by mouth 2 (two) times daily with a meal. 11/16/20   Nicole Kindred A, DO  fluticasone (FLONASE) 50 MCG/ACT nasal spray Place 1 spray into both nostrils daily. Patient not taking: Reported on 02/13/2022 02/02/22   Charlott Rakes, MD  furosemide (LASIX) 40 MG tablet Take 1 tablet (40 mg total) by mouth  2 (two) times daily. 12/18/21   Charlott Rakes, MD  glucose blood (ACCU-CHEK GUIDE) test strip Use as instructed daily 12/18/21   Charlott Rakes, MD  hydrALAZINE (APRESOLINE) 50 MG tablet Take 1 tablet (50 mg total) by mouth every 8 (eight) hours. 12/18/21   Charlott Rakes, MD  metFORMIN (GLUCOPHAGE) 500 MG tablet Take 1 tablet (500 mg total) by mouth daily with breakfast. 12/18/21   Charlott Rakes, MD  Multiple Vitamin (MULTIVITAMIN WITH  MINERALS) TABS tablet Take 1 tablet by mouth daily. 11/16/20   Ezekiel Slocumb, DO  nitroGLYCERIN (NITROSTAT) 0.4 MG SL tablet Place 1 tablet (0.4 mg total) under the tongue every 5 (five) minutes as needed for chest pain. 11/19/20 02/13/22  Florencia Reasons, MD  OXYGEN Inhale 2 L/min into the lungs as directed. On exertion or when walking    [provider]  SALINE NA Place 2 sprays into the nose 2 (two) times daily as needed (dryness).    [provider]  traMADol (ULTRAM) 50 MG tablet Take 1 tablet (50 mg total) by mouth every 6 (six) hours as needed for moderate pain. Patient not taking: Reported on 03/21/2022 02/11/21   Domenic Polite, MD  triamcinolone cream (KENALOG) 0.1 % Apply 1 application. topically 2 (two) times daily. Patient taking differently: Apply 1 application  topically See admin instructions. 1 application 1-2 times a day as needed for rash on elbow 09/20/21   Freeman Caldron M, PA-C  XARELTO 20 MG TABS tablet TAKE 1 TABLET EVERY DAY WITH SUPPER (MUST HAVE OFFICE VISIT FOR REFILLS) Patient taking differently: Take 20 mg by mouth daily with supper. 11/08/21   Charlott Rakes, MD  TUDORZA PRESSAIR 400 MCG/ACT AEPB INHALE 1 PUFF INTO THE LUNGS IN THE MORNING AND AT BEDTIME. 03/29/20 04/04/20  Tanda Rockers, MD      Allergies    Lisinopril    Review of Systems   Review of Systems Ten systems are reviewed and are negative for acute change except as noted in the HPI  Physical Exam Updated Vital Signs BP 110/64   Pulse 90   Temp 98.8 F (37.1 C) (Oral)   Resp 15   Ht _0  (1.854 m)   Wt 99.8 kg   SpO2 100%   BMI 29.03 kg/m  Physical Exam Constitutional:      General: He is not in acute distress.    Appearance: Normal appearance. He is well-developed. He is not ill-appearing or diaphoretic.  HENT:     Head: Normocephalic and atraumatic.  Eyes:     General: Vision grossly intact. Gaze aligned appropriately.     Pupils: Pupils are equal, round, and reactive to  light.  Neck:     Trachea: Trachea and phonation normal.  Cardiovascular:     Rate and Rhythm: Normal rate and regular rhythm.  Pulmonary:     Effort: Pulmonary effort is normal. No respiratory distress.     Breath sounds: Wheezing present.  Abdominal:     General: There is no distension.     Palpations: Abdomen is soft.     Tenderness: There is no abdominal tenderness. There is no guarding or rebound.  Genitourinary:    Comments: Rectal examination chaperoned by Merrily Pew RN.  No external hemorrhoids or fissures.  Normal rectal tone.  No palpable internal hemorrhoids.  No gross blood on exam.  No melena.  No stool in the rectal vault. Musculoskeletal:        General: Normal range of motion.  Cervical back: Normal range of motion.     Right lower leg: No tenderness. No edema.     Left lower leg: No tenderness. No edema.  Skin:    General: Skin is warm and dry.  Neurological:     Mental Status: He is alert.     GCS: GCS eye subscore is 4. GCS verbal subscore is 5. GCS motor subscore is 6.     Comments: Speech is clear and goal oriented, follows commands Major Cranial nerves without deficit, no facial droop Moves extremities without ataxia, coordination intact  Psychiatric:        Behavior: Behavior normal.     ED Results / Procedures / Treatments   Labs (all labs ordered are listed, but only abnormal results are displayed) Labs Reviewed  BASIC METABOLIC PANEL - Abnormal; Notable for the following components:      Result Value   CO2 33 (*)    Glucose, Bld 117 (*)    Calcium 8.8 (*)    All other components within normal limits  CBC WITH DIFFERENTIAL/PLATELET - Abnormal; Notable for the following components:   RBC 2.90 (*)    Hemoglobin 6.0 (*)    HCT 22.3 (*)    MCV 76.9 (*)    MCH 20.7 (*)    MCHC 26.9 (*)    RDW 21.8 (*)    nRBC 0.3 (*)    nRBC 1 (*)    All other components within normal limits  BRAIN NATRIURETIC PEPTIDE - Abnormal; Notable for the following  components:   B Natriuretic Peptide 379.5 (*)    All other components within normal limits  TROPONIN I (HIGH SENSITIVITY) - Abnormal; Notable for the following components:   Troponin I (High Sensitivity) 24 (*)    All other components within normal limits  RESP PANEL BY RT-PCR (FLU A&B, COVID) ARPGX2  PATHOLOGIST SMEAR REVIEW  FERRITIN  IRON AND TIBC  VITAMIN B12  POC OCCULT BLOOD, ED  TYPE AND SCREEN  PREPARE RBC (CROSSMATCH)  TROPONIN I (HIGH SENSITIVITY)    EKG EKG Interpretation  Date/Time:  Wednesday March 21 2022 12:58:07 EDT Ventricular Rate:  91 PR Interval:  151 QRS Duration: 115 QT Interval:  386 QTC Calculation: 475 R Axis:   -83 Text Interpretation: Sinus rhythm Atrial premature complexes Left anterior fascicular block Low voltage, precordial leads Consider anterior infarct Interpretation limited secondary to artifact but not significant change compared from prior Confirmed by Leanord Asal ([id number]) on 03/21/2022 3:18:06 PM  Radiology DG Chest 2 View  Result Date: 03/21/2022 CLINICAL DATA:  Shortness of breath EXAM: CHEST - 2 VIEW COMPARISON:  Chest x-ray dated January 03, 2022 FINDINGS: Unchanged cardiomegaly. Lungs are clear. No pleural effusion or pneumothorax. Old left-sided rib fractures. IMPRESSION: No acute cardiopulmonary abnormality. Electronically Signed   By: Yetta Glassman M.D.   On: 03/21/2022 13:32    Procedures .Critical Care  Performed by: Deliah Boston, PA-C Authorized by: Deliah Boston, PA-C   Critical care provider statement:    Critical care time (minutes):  40   Critical care was necessary to treat or prevent imminent or life-threatening deterioration of the following conditions: COPD exacerbation requiring supplemental oxygen and admission.  Symptomatic anemia requiring a blood transfusion.   Critical care was time spent personally by me on the following activities:  Development of treatment plan with patient or surrogate,  discussions with consultants, evaluation of patient's response to treatment, examination of patient, ordering and review of laboratory studies, ordering and  review of radiographic studies, ordering and performing treatments and interventions, pulse oximetry, re-evaluation of patient's condition, review of old charts, blood draw for specimens and obtaining history from patient or surrogate   Care discussed with: admitting provider       Medications Ordered in ED Medications  0.9 %  sodium chloride infusion (has no administration in time range)  ipratropium-albuterol (DUONEB) 0.5-2.5 (3) MG/3ML nebulizer solution 3 mL (3 mLs Nebulization Given 03/21/22 1402)    ED Course/ Medical Decision Making/ A&P Clinical Course as of 03/21/22 1619  Wed Mar 21, 2022  1518 Admit copd anemia [BM]  1616 Hospitalist [BM]    Clinical Course User Index [BM] Gari Crown                           Medical Decision Making 66 year old male with history as above presented for shortness of breath acute on chronic worsening of the past 1 week with fatigue.  No recent infectious symptoms he has been using his albuterol without improvement.  He reports some white sputum production and chest pain.  Differential includes but not limited to COPD exacerbation, CHF exacerbation, ACS, PTX, PNA, anemia.  Will obtain cardiac labs along with BNP, chest x-ray.  He has significant wheezing and decreased air movement on exam, he has received 5 mg albuterol nebulizer by EMS.  We will give an additional 3 DuoNebs here.  He has received 125 mg Solu-Medrol by EMS as well.  Amount and/or Complexity of Data Reviewed External Data Reviewed: notes.    Details: I have reviewed recent ED encounters and admission in July 2023. Labs: ordered.    Details: BNP elevated at 379, up from 250 to 3 months ago. CBC shows anemia of 6.0 is new from 3 months ago when it was at 15.0.  No leukocytosis to suggest infectious process.  No  thrombocytopenia Troponin elevated at 24 similar to prior. COVID/influenza panel negative BMP without emergent electrolyte derangement, AKI or gap Hemoccult negative per Merrily Pew RN. Radiology: ordered.    Details: I have personally reviewed and interpreted patient's two-view chest x-ray.  I do not appreciate any obvious PTX, PNA or other acute cardiopulmonary process. ECG/medicine tests: ordered and independent interpretation performed.    Details: I personally reviewed and interpreted patient's twelve-lead EKG.  I do not appreciate any obvious acute ischemic changes.  Low voltage anterior leads, RN asked to repeat.  Risk Prescription drug management. Decision regarding hospitalization. Risk Details: Patient appears to be experiencing a COPD exacerbation today he has bilateral wheezing and decreased air movement on examination.  This is slightly improved following 3 DuoNebs and IV Solu-Medrol, patient reports continued shortness of breath.  He remains on 2 L nasal cannula which he does use at baseline.  Additionally lab work revealed anemia with hemoglobin of 6.0.  Patient denies any recent melena, hematochezia or hematemesis.  Rectal examination today did not reveal any bleeding and Hemoccult was negative.  Patient will need admission suspect he is also experiencing symptomatic anemia contributing to his shortness of breath and fatigue.  Patient is agreeable to admission he is also consented for blood transfusion today.  All questions were answered. Case was discussed w/ Dr. Maylon Peppers who agrees with plan.  Critical Care Total time providing critical care: 40 minutes  Consulted with hospitalist Dr. Rogers Blocker, patient accepted for admission.  Note: Portions of this report may have been transcribed using voice recognition software. Every effort was made to  ensure accuracy; however, inadvertent computerized transcription errors may still be present.         Final Clinical Impression(s) / ED  Diagnoses Final diagnoses:  Shortness of breath  COPD exacerbation ([place])  Symptomatic anemia    Rx / DC Orders ED Discharge Orders     None         Deliah Boston, PA-C 03/21/22 [place], [place], DO 03/21/22 1624

## 2022-03-21 NOTE — Assessment & Plan Note (Signed)
Check TSH/B12/CK  Discussed most obvious issue would be his anemia causing weakness and fatigue Transfuse as per above PT to see

## 2022-03-21 NOTE — Assessment & Plan Note (Signed)
Soft to hypotensive readings Hold home coreg, lasix, imdur and hydralazine, he is actually unsure what is taking Receiving 1 unit PRBC, add back anti-hypertensive agents as allowed

## 2022-03-21 NOTE — ED Notes (Addendum)
Admitting paged regarding pt's post transfusion Hgb, new orders received

## 2022-03-21 NOTE — Assessment & Plan Note (Signed)
No desire to quit Nicotine patch

## 2022-03-21 NOTE — Progress Notes (Signed)
  X-cover Note: Post-transfusion HgB of 7.1 after 1 unit PRBC. According to H&P, goal HgB is >8.0. will transfuse 1 additional unit. Will give 20 mg IV lasix now.   Kristopher Oppenheim, DO [facility]

## 2022-03-22 ENCOUNTER — Encounter (HOSPITAL_COMMUNITY)
Admission: EM | Disposition: A | Discharge status: Home / Self Care | Payer: Self-pay | Source: Home / Self Care | Attending: Emergency Medicine

## 2022-03-22 ENCOUNTER — Observation Stay (HOSPITAL_COMMUNITY): Payer: Medicare HMO | Admitting: Anesthesiology

## 2022-03-22 ENCOUNTER — Ambulatory Visit: Payer: Medicare HMO | Admitting: Family Medicine

## 2022-03-22 ENCOUNTER — Encounter (HOSPITAL_COMMUNITY): Payer: Self-pay | Admitting: Family Medicine

## 2022-03-22 ENCOUNTER — Other Ambulatory Visit: Payer: Self-pay

## 2022-03-22 ENCOUNTER — Observation Stay (HOSPITAL_BASED_OUTPATIENT_CLINIC_OR_DEPARTMENT_OTHER): Payer: Medicare HMO | Admitting: Anesthesiology

## 2022-03-22 DIAGNOSIS — K31819 Angiodysplasia of stomach and duodenum without bleeding: Secondary | ICD-10-CM

## 2022-03-22 DIAGNOSIS — I2510 Atherosclerotic heart disease of native coronary artery without angina pectoris: Secondary | ICD-10-CM | POA: Diagnosis not present

## 2022-03-22 DIAGNOSIS — F17210 Nicotine dependence, cigarettes, uncomplicated: Secondary | ICD-10-CM | POA: Diagnosis not present

## 2022-03-22 DIAGNOSIS — K2289 Other specified disease of esophagus: Secondary | ICD-10-CM | POA: Diagnosis not present

## 2022-03-22 DIAGNOSIS — I509 Heart failure, unspecified: Secondary | ICD-10-CM

## 2022-03-22 DIAGNOSIS — T182XXA Foreign body in stomach, initial encounter: Secondary | ICD-10-CM

## 2022-03-22 DIAGNOSIS — D649 Anemia, unspecified: Secondary | ICD-10-CM | POA: Diagnosis not present

## 2022-03-22 DIAGNOSIS — K922 Gastrointestinal hemorrhage, unspecified: Secondary | ICD-10-CM | POA: Diagnosis not present

## 2022-03-22 DIAGNOSIS — Z7901 Long term (current) use of anticoagulants: Secondary | ICD-10-CM | POA: Diagnosis not present

## 2022-03-22 DIAGNOSIS — I110 Hypertensive heart disease with heart failure: Secondary | ICD-10-CM | POA: Diagnosis not present

## 2022-03-22 HISTORY — PX: HOT HEMOSTASIS: SHX5433

## 2022-03-22 HISTORY — PX: ENTEROSCOPY: SHX5533

## 2022-03-22 LAB — CBC
HCT: 27.8 % — ABNORMAL LOW (ref 39.0–52.0)
HCT: 28.5 % — ABNORMAL LOW (ref 39.0–52.0)
HCT: 28.9 % — ABNORMAL LOW (ref 39.0–52.0)
Hemoglobin: 8 g/dL — ABNORMAL LOW (ref 13.0–17.0)
Hemoglobin: 8 g/dL — ABNORMAL LOW (ref 13.0–17.0)
Hemoglobin: 8.2 g/dL — ABNORMAL LOW (ref 13.0–17.0)
MCH: 22.8 pg — ABNORMAL LOW (ref 26.0–34.0)
MCH: 22.7 pg — ABNORMAL LOW (ref 26.0–34.0)
MCH: 22.6 pg — ABNORMAL LOW (ref 26.0–34.0)
MCHC: 28.8 g/dL — ABNORMAL LOW (ref 30.0–36.0)
MCHC: 28.1 g/dL — ABNORMAL LOW (ref 30.0–36.0)
MCHC: 28.4 g/dL — ABNORMAL LOW (ref 30.0–36.0)
MCV: 79.2 fL — ABNORMAL LOW (ref 80.0–100.0)
MCV: 80.7 fL (ref 80.0–100.0)
MCV: 79.6 fL — ABNORMAL LOW (ref 80.0–100.0)
Platelets: 177 10*3/uL (ref 150–400)
Platelets: 171 10*3/uL (ref 150–400)
Platelets: 169 10*3/uL (ref 150–400)
RBC: 3.51 MIL/uL — ABNORMAL LOW (ref 4.22–5.81)
RBC: 3.53 MIL/uL — ABNORMAL LOW (ref 4.22–5.81)
RBC: 3.63 MIL/uL — ABNORMAL LOW (ref 4.22–5.81)
RDW: 21.9 % — ABNORMAL HIGH (ref 11.5–15.5)
RDW: 22 % — ABNORMAL HIGH (ref 11.5–15.5)
RDW: 21.8 % — ABNORMAL HIGH (ref 11.5–15.5)
WBC: 4.2 10*3/uL (ref 4.0–10.5)
WBC: 4.3 10*3/uL (ref 4.0–10.5)
WBC: 7 10*3/uL (ref 4.0–10.5)
nRBC: 1.2 % — ABNORMAL HIGH (ref 0.0–0.2)
nRBC: 2.3 % — ABNORMAL HIGH (ref 0.0–0.2)
nRBC: 2.1 % — ABNORMAL HIGH (ref 0.0–0.2)

## 2022-03-22 LAB — BASIC METABOLIC PANEL
Anion gap: 12 (ref 5–15)
BUN: 8 mg/dL (ref 8–23)
CO2: 33 mmol/L — ABNORMAL HIGH (ref 22–32)
Calcium: 9.4 mg/dL (ref 8.9–10.3)
Chloride: 97 mmol/L — ABNORMAL LOW (ref 98–111)
Creatinine, Ser: 0.76 mg/dL (ref 0.61–1.24)
GFR, Estimated: >60 mL/min (ref >60)
Glucose, Bld: 173 mg/dL — ABNORMAL HIGH (ref 70–99)
Potassium: 3.9 mmol/L (ref 3.5–5.1)
Sodium: 142 mmol/L (ref 135–145)

## 2022-03-22 LAB — GLUCOSE, CAPILLARY
Glucose-Capillary: 149 mg/dL — ABNORMAL HIGH (ref 70–99)
Glucose-Capillary: 205 mg/dL — ABNORMAL HIGH (ref 70–99)
Glucose-Capillary: 216 mg/dL — ABNORMAL HIGH (ref 70–99)
Glucose-Capillary: 185 mg/dL — ABNORMAL HIGH (ref 70–99)

## 2022-03-22 SURGERY — ENTEROSCOPY
Anesthesia: Monitor Anesthesia Care

## 2022-03-22 MED ORDER — LACTATED RINGERS IV SOLN: INTRAVENOUS | Status: DC | PRN

## 2022-03-22 MED ORDER — CARVEDILOL 3.125 MG PO TABS
3.1250 mg | ORAL_TABLET | Freq: Two times a day (BID) | ORAL | Status: DC
  Administered 2022-03-22 – 2022-03-24 (×4): 3.125 mg via ORAL
  Filled 2022-03-22 (×4): qty 1

## 2022-03-22 MED ORDER — PROPOFOL 500 MG/50ML IV EMUL
INTRAVENOUS | Status: DC | PRN
  Administered 2022-03-22: 150 ug/kg/min via INTRAVENOUS

## 2022-03-22 MED ORDER — FOLIC ACID 1 MG PO TABS
1.0000 mg | ORAL_TABLET | Freq: Every day | ORAL | Status: DC
  Administered 2022-03-22 – 2022-03-24 (×3): 1 mg via ORAL
  Filled 2022-03-22 (×3): qty 1

## 2022-03-22 MED ORDER — PHENYLEPHRINE 80 MCG/ML (10ML) SYRINGE FOR IV PUSH (FOR BLOOD PRESSURE SUPPORT)
PREFILLED_SYRINGE | INTRAVENOUS | Status: DC | PRN
  Administered 2022-03-22 (×3): 80 ug via INTRAVENOUS

## 2022-03-22 MED ORDER — UMECLIDINIUM BROMIDE 62.5 MCG/ACT IN AEPB
1.0000 | INHALATION_SPRAY | Freq: Every day | RESPIRATORY_TRACT | Status: DC
  Administered 2022-03-22 – 2022-03-24 (×3): 1 via RESPIRATORY_TRACT
  Filled 2022-03-22: qty 7

## 2022-03-22 MED ORDER — METOPROLOL TARTRATE 5 MG/5ML IV SOLN: 5.0000 mg | Freq: Three times a day (TID) | INTRAVENOUS | Status: DC | PRN

## 2022-03-22 MED ORDER — CARVEDILOL 3.125 MG PO TABS: 3.1250 mg | ORAL_TABLET | Freq: Two times a day (BID) | ORAL | Status: DC

## 2022-03-22 MED ORDER — ORAL CARE MOUTH RINSE: 15.0000 mL | OROMUCOSAL | Status: DC | PRN

## 2022-03-22 MED ORDER — FLUTICASONE FUROATE-VILANTEROL 200-25 MCG/ACT IN AEPB
1.0000 | INHALATION_SPRAY | Freq: Every day | RESPIRATORY_TRACT | Status: DC
  Administered 2022-03-22 – 2022-03-24 (×3): 1 via RESPIRATORY_TRACT
  Filled 2022-03-22: qty 28

## 2022-03-22 MED ORDER — FERROUS SULFATE 325 (65 FE) MG PO TABS
325.0000 mg | ORAL_TABLET | Freq: Two times a day (BID) | ORAL | Status: DC
  Administered 2022-03-22 – 2022-03-24 (×5): 325 mg via ORAL
  Filled 2022-03-22 (×5): qty 1

## 2022-03-22 NOTE — Consult Note (Signed)
Consultation Note   Referring Provider: [facility] PCP: Jared Rakes, MD Primary Gastroenterologist: Jared Lund, MD Reason for consultation: GI bleed, history of PUD  Hospital Day: 2  Assessment / Plan   # 66 yo male with recurrent anemia on Xarelto. Hgb 6, down from 15 in July. FOBT negative now but had a few days of black stool two weeks ago. History of PUD / H.pylori s/p quadruple therapy with negative follow up stool testing July 2022. He has been taking NSAIDS so rule out PUD and / or small bowel AVMs..  Last dose of Xarelto was Tuesday evening ( 10/10) Schedule for enteroscopy to be done today. The risks and benefits of the procedure were discussed with the patient who agrees to proceed.  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   # PAF, last dose of Xarelto was evening of 10/10.   # Chronic respiratory failure, on 02 at home as needed  See PMH for additional medical problems   HPI   Jared [name] is a 66 y.o. male with a past medical history significant for  chronic iron deficiency anemia, small bowel AVMs, PUD, H.pylori infection ( s/p quadruple tx),  COPD, chronic hypoxic and hypercarbic respiratory failure on home O2, chronic combined systolic and diastolic CHF, tobacco abuse, paroxysmal A. fib on Xarelto, history of CVA. See PMH for any additional medical problems.  Patient has been hospitalized several times for GI bleed / anemia secondary to intestinal AVMs and PUD. He has a history of H.pylori s/p quadruple tx.   02/13/22 Office visit.  Jared [name] was seen for evaluation of abdominal pain, bloating , early satiety felt possibly to be due to advance COPD with lung hyperinflation, aerophagia from smoking / dyspnea.   Patient presented to ED yesterday for evaluation of SHOB and fatigue over the last week. His hgb was 6, down from 15 in July.   He was FOBT negative but  two weeks ago he had a few days worth of black stools. He takes iron very sporadically but it doesn't typically turn his stool black. No abdominal pain, N/V. He does complain of abdominal bloating / gas ( same symptoms at time of last office visit). He takes Copywriter, advertising on a regular basis. Also takes Aleve.   Previous GI Evaluation    06/14/2019 capsule endoscopy: Complete capsule endoscopy with findings of gastric erythema/gastritis, no AVMs visualized, no active bleeding 10/28/2018 small bowel enteroscopy: Small AVM ablated, otherwise normal 09/28/2018 small bowel enteroscopy: Normal 09/26/2018 capsule endoscopy: Active bleeding 1 minute after entry into the duodenum, enteroscopy scheduled the next day March 2020 colonoscopy for GI bleeding:  complete exam, excellent prep.  Entire colon normal except for hemorrhoids 10/09/20 small bowel endoscopy: Normal small bowel enteroscopy to the proximal jejunum. Suspect iron deficiency secondary to intermittent mucosal oozing in the patient on chronic oral anticoagulation therapy 11/12/20 EGD:   --No gross lesions in esophagus proximally.  --Salmon-colored mucosa noted in distal esoohagus - not biopsied due to concern for possible underlying varices.  --Hematin (altered blood/coffee-ground-like material) in the entire stomach - lavaged. - 2 gastric ulcers as described above with a nonbleeding visible vessel (Forrest Class IIa). - Gastritis. Biopsied. -  Blood in the entire examined duodenum - lavaged. - No gross lesions in the entire examined duodenum. - Jejunal blood - lavaged . - No gross lesions in the entire examined proximal jejunum. Tattooed distal extent.   A. GASTRIC BIOPSY:  - Chronic active gastritis with Helicobacter pylori with focal  intestinal metaplasia. Warthin-Starry is positive for Helicobacter pylori.  - No dysplasia or malignancy   11/19/20 EGD. Repeat exam done because patient returned to hospital hypotensive with abdominal pain   -Z-line irregular, 44 cm from the incisors. - Endoclips were found in the gastric cardia/fundus without evidence of rebleeding or stigmata or recent bleeding. Good treatment effective from endoclipping.    Recent Labs and Imaging DG Chest 2 View  Result Date: 03/21/2022 CLINICAL DATA:  Shortness of breath EXAM: CHEST - 2 VIEW COMPARISON:  Chest x-ray dated January 03, 2022 FINDINGS: Unchanged cardiomegaly. Lungs are clear. No pleural effusion or pneumothorax. Old left-sided rib fractures. IMPRESSION: No acute cardiopulmonary abnormality. Electronically Signed   By: Yetta Glassman M.D.   On: 03/21/2022 13:32    Labs:  Recent Labs    03/21/22 1750 03/21/22 2150 03/22/22 0551  WBC 6.4 6.0 4.2  HGB 6.0* 7.1* 8.0*  HCT 23.5* 25.0* 27.8*  PLT 197 186 177   Recent Labs    03/21/22 1342 03/22/22 0551  NA 141 142  K 3.6 3.9  CL 101 97*  CO2 33* 33*  GLUCOSE 117* 173*  BUN 9 8  CREATININE 0.84 0.76  CALCIUM 8.8* 9.4   No results for input(s): "PROT", "ALBUMIN", "AST", "ALT", "ALKPHOS", "BILITOT", "BILIDIR", "IBILI" in the last 72 hours. No results for input(s): "HEPBSAG", "HCVAB", "HEPAIGM", "HEPBIGM" in the last 72 hours. Recent Labs    03/21/22 2150  LABPROT 14.4  INR 1.1    Past Medical History:  Diagnosis Date   AVM (arteriovenous malformation)    CAD (coronary artery disease)    a. LHC 5/12:  LAD 20, pLCx 20, pRCA 40, dRCA 40, EF 35%, diff HK  //  b. Myoview 4/16: Overall Impression:  High risk stress nuclear study There is no evidence of ischemia.  There is severe LV dysfunction. LV Ejection Fraction: 30%.  LV Wall Motion:  There is global LV hypokinesis.     CAP (community acquired pneumonia) 09/2013   Chronic combined systolic and diastolic CHF (congestive heart failure) ([place])    a. Echo 4/16:Mild LVH, EF 40-45%, diffuse HK //  b. Echo 8/17: EF 35-40%, diffuse HK, diastolic dysfunction, aortic sclerosis, trivial MR, moderate LAE, normal RVSF, moderate RAE, mild TR,  PASP 42 mmHg // c. Echo 4/18: Mild concentric LVH, EF 30-35, normal wall motion, grade 1 diastolic dysfunction, PASP 49   Chronic respiratory failure (HCC)    Cluster headache    "hx; haven't had one in awhile" (01/09/2016)   COPD (chronic obstructive pulmonary disease) (HCC)    [name] 01/09/2016   DM (diabetes mellitus) ([place])    History of CVA (cerebrovascular accident)    Hypertension    IDA (iron deficiency anemia)    Moderate tobacco use disorder    NICM (nonischemic cardiomyopathy) ([place])    Nicotine addiction    Tobacco abuse     Past Surgical History:  Procedure Laterality Date   BIOPSY  11/12/2020   Procedure: BIOPSY;  Surgeon: Irving Copas., MD;  Location: [facility];  Service: Gastroenterology;;   CARDIAC CATHETERIZATION  10/2010   LM normal, LAD with 20% irregularities, LCX with 20%, RCA with 40% prox and  40% distal - EF of 35%   CATARACT EXTRACTION, BILATERAL     COLONOSCOPY W/ BIOPSIES AND POLYPECTOMY     COLONOSCOPY WITH PROPOFOL N/A 09/06/2018   Procedure: COLONOSCOPY WITH PROPOFOL;  Surgeon: Thornton Park, MD;  Location: [place];  Service: Gastroenterology;  Laterality: N/A;   ENTEROSCOPY N/A 09/28/2018   Procedure: ENTEROSCOPY;  Surgeon: Carol Ada, MD;  Location: [place];  Service: Endoscopy;  Laterality: N/A;   ENTEROSCOPY N/A 10/28/2018   Procedure: ENTEROSCOPY;  Surgeon: Thornton Park, MD;  Location: [place];  Service: Gastroenterology;  Laterality: N/A;   ENTEROSCOPY N/A 10/09/2020   Procedure: ENTEROSCOPY;  Surgeon: Irene Shipper, MD;  [facility];  Service: Endoscopy;  Laterality: N/A;   ESOPHAGOGASTRODUODENOSCOPY N/A 11/12/2020   Procedure: ESOPHAGOGASTRODUODENOSCOPY (EGD);  Surgeon: Irving Copas., MD;  Location: [facility];  Service: Gastroenterology;  Laterality: N/A;   ESOPHAGOGASTRODUODENOSCOPY (EGD) WITH PROPOFOL N/A 09/05/2018   Procedure: ESOPHAGOGASTRODUODENOSCOPY (EGD) WITH PROPOFOL;  Surgeon:  Yetta Flock, MD;  Location: [place];  Service: Gastroenterology;  Laterality: N/A;   ESOPHAGOGASTRODUODENOSCOPY (EGD) WITH PROPOFOL N/A 11/19/2020   Procedure: ESOPHAGOGASTRODUODENOSCOPY (EGD) WITH PROPOFOL;  Surgeon: Jerene Bears, MD;  Location: [facility];  Service: Gastroenterology;  Laterality: N/A;   EXCISION MASS HEAD     GIVENS CAPSULE STUDY N/A 09/06/2018   Procedure: GIVENS CAPSULE STUDY;  Surgeon: Thornton Park, MD;  Location: [place];  Service: Gastroenterology;  Laterality: N/A;   GIVENS CAPSULE STUDY N/A 09/26/2018   Procedure: GIVENS CAPSULE STUDY;  Surgeon: Jerene Bears, MD;  Location: [place];  Service: Gastroenterology;  Laterality: N/A;   GIVENS CAPSULE STUDY N/A 06/14/2019   Procedure: GIVENS CAPSULE STUDY;  Surgeon: Mauri Pole, MD;  Location: [place] [facility];  Service: Endoscopy;  Laterality: N/A;   HEMOSTASIS CLIP PLACEMENT  11/12/2020   Procedure: HEMOSTASIS CLIP PLACEMENT;  Surgeon: Irving Copas., MD;  Location: [facility];  Service: Gastroenterology;;   HEMOSTASIS CONTROL  11/12/2020   Procedure: HEMOSTASIS CONTROL;  Surgeon: Irving Copas., MD;  Location: [facility];  Service: Gastroenterology;;   HOT HEMOSTASIS N/A 10/28/2018   Procedure: HOT HEMOSTASIS (ARGON PLASMA COAGULATION/BICAP);  Surgeon: Thornton Park, MD;  Location: [place];  Service: Gastroenterology;  Laterality: N/A;   HOT HEMOSTASIS N/A 11/12/2020   Procedure: HOT HEMOSTASIS (ARGON PLASMA COAGULATION/BICAP);  Surgeon: Irving Copas., MD;  Location: [facility];  Service: Gastroenterology;  Laterality: N/A;   INCISION AND DRAINAGE PERIRECTAL ABSCESS N/A 06/05/2017   Procedure: IRRIGATION AND DEBRIDEMENT PERIRECTAL ABSCESS;  Surgeon: Ileana Roup, MD;  Location: [place];  Service: General;  Laterality: N/A;   SUBMUCOSAL TATTOO INJECTION  11/12/2020   Procedure: SUBMUCOSAL TATTOO INJECTION;  Surgeon: Irving Copas., MD;   Location: [facility];  Service: Gastroenterology;;   VIDEO BRONCHOSCOPY Bilateral 05/08/2016   Procedure: VIDEO BRONCHOSCOPY WITH FLUORO;  Surgeon: Rigoberto Noel, MD;  Location: [place];  Service: Cardiopulmonary;  Laterality: Bilateral;    Family History  Problem Relation Age of Onset   Heart disease Mother    Diabetes Mother    Colon cancer Mother    Liver cancer Mother    Cancer Father        type unknown   Diabetes Sister        x 2   Diabetes Brother     Prior to Admission medications   Medication Sig Start Date End Date Taking? Authorizing Provider  albuterol (PROVENTIL) (2.5 MG/3ML) 0.083% nebulizer solution Take 3 mLs (2.5 mg total)  by nebulization every 6 (six) hours as needed for wheezing or shortness of breath. 12/18/21  Yes Newlin, Enobong, MD  albuterol (VENTOLIN HFA) 108 (90 Base) MCG/ACT inhaler INHALE 2 PUFFS INTO THE LUNGS EVERY 6 (SIX) HOURS AS NEEDED FOR WHEEZING OR SHORTNESS OF BREATH. Patient taking differently: Inhale 2 puffs into the lungs 3 (three) times daily as needed for wheezing or shortness of breath. 09/20/21 09/20/22 Yes Jared Rakes, MD  Aspirin Effervescent (ALKA-SELTZER ORIGINAL PO) Take 1 packet by mouth 2 (two) times daily as needed (bloat).   Yes [provider]  atorvastatin (LIPITOR) 80 MG tablet Take 1 tablet (80 mg total) by mouth daily. 11/08/21  Yes Jared Rakes, MD  Budeson-Glycopyrrol-Formoterol (BREZTRI AEROSPHERE) 160-9-4.8 MCG/ACT AERO Take 2 puffs first thing in morning and then another 2 puffs about 12 hours later. Patient taking differently: Inhale 2 puffs into the lungs in the morning and at bedtime. 12/06/21  Yes Tanda Rockers, MD  carvedilol (COREG) 3.125 MG tablet Take 1 tablet (3.125 mg total) by mouth 2 (two) times daily with a meal. 12/18/21  Yes Newlin, Enobong, MD  ezetimibe (ZETIA) 10 MG tablet Take 1 tablet (10 mg total) by mouth daily. 01/31/22  Yes Fay Records, MD  furosemide (LASIX) 40 MG tablet Take 1  tablet (40 mg total) by mouth 2 (two) times daily. 12/18/21  Yes Newlin, Charlane Ferretti, MD  gabapentin (NEURONTIN) 300 MG capsule TAKE 1 CAPSULE (300 MG TOTAL) BY MOUTH AT BEDTIME. Patient taking differently: Take 300 mg by mouth at bedtime. 02/02/22 02/02/23 Yes Jared Rakes, MD  hydrALAZINE (APRESOLINE) 50 MG tablet Take 1 tablet (50 mg total) by mouth every 8 (eight) hours. 12/18/21  Yes Jared Rakes, MD  isosorbide mononitrate (IMDUR) 30 MG 24 hr tablet Take 1 tablet (30 mg total) by mouth daily. 12/18/21  Yes Jared Rakes, MD  nitroGLYCERIN (NITROSTAT) 0.4 MG SL tablet Place 1 tablet (0.4 mg total) under the tongue every 5 (five) minutes as needed for chest pain. 11/19/20 03/21/22 Yes Florencia Reasons, MD  XARELTO 20 MG TABS tablet TAKE 1 TABLET EVERY DAY WITH SUPPER (MUST HAVE OFFICE VISIT FOR REFILLS) Patient taking differently: Take 20 mg by mouth daily with supper. 11/08/21  Yes Jared Rakes, MD  Accu-Chek Softclix Lancets lancets Use as instructed daily before meals 12/18/21   Jared Rakes, MD  Blood Glucose Monitoring Suppl (ACCU-CHEK GUIDE) w/Device KIT Use as directed three times daily 12/18/21   Jared Rakes, MD  fluticasone (FLONASE) 50 MCG/ACT nasal spray Place 1 spray into both nostrils daily. Patient not taking: Reported on 02/13/2022 02/02/22   Jared Rakes, MD  glucose blood (ACCU-CHEK GUIDE) test strip Use as instructed daily 12/18/21   Jared Rakes, MD  metFORMIN (GLUCOPHAGE) 500 MG tablet Take 1 tablet (500 mg total) by mouth daily with breakfast. Patient not taking: Reported on 03/21/2022 12/18/21   Jared Rakes, MD  OXYGEN Inhale 2 L/min into the lungs as directed. On exertion or when walking    [provider]  triamcinolone cream (KENALOG) 0.1 % Apply 1 application. topically 2 (two) times daily. Patient not taking: Reported on 03/21/2022 09/20/21   Argentina Donovan, PA-C  TUDORZA PRESSAIR 400 MCG/ACT AEPB INHALE 1 PUFF INTO THE LUNGS IN THE MORNING AND AT  BEDTIME. 03/29/20 04/04/20  Tanda Rockers, MD    Current Facility-Administered Medications  Medication Dose Route Frequency Provider Last Rate Last Admin   0.9 %  sodium chloride infusion (Manually program via Guardrails IV Fluids)  Intravenous Once Kristopher Oppenheim, DO       0.9 %  sodium chloride infusion  250 mL Intravenous PRN Orma Flaming, MD       acetaminophen (TYLENOL) tablet 650 mg  650 mg Oral Q6H PRN Orma Flaming, MD       Or   acetaminophen (TYLENOL) suppository 650 mg  650 mg Rectal Q6H PRN Orma Flaming, MD       atorvastatin (LIPITOR) tablet 80 mg  80 mg Oral Daily Orma Flaming, MD   80 mg at 03/22/22 0836   carvedilol (COREG) tablet 3.125 mg  3.125 mg Oral BID WC Thurnell Lose, MD       ezetimibe (ZETIA) tablet 10 mg  10 mg Oral Daily Orma Flaming, MD   10 mg at 03/22/22 6314   ferrous sulfate tablet 325 mg  325 mg Oral BID WC Thurnell Lose, MD   325 mg at 03/22/22 0836   fluticasone furoate-vilanterol (BREO ELLIPTA) 200-25 MCG/ACT 1 puff  1 puff Inhalation Daily Orma Flaming, MD   1 puff at 03/22/22 0850   And   umeclidinium bromide (INCRUSE ELLIPTA) 62.5 MCG/ACT 1 puff  1 puff Inhalation Daily Orma Flaming, MD   1 puff at 97/02/63 7858   folic acid (FOLVITE) tablet 1 mg  1 mg Oral Daily Thurnell Lose, MD   1 mg at 03/22/22 0836   gabapentin (NEURONTIN) capsule 300 mg  300 mg Oral QHS Orma Flaming, MD   300 mg at 03/21/22 2306   insulin aspart (novoLOG) injection 0-9 Units  0-9 Units Subcutaneous TID WC Orma Flaming, MD   1 Units at 03/22/22 0836   ipratropium-albuterol (DUONEB) 0.5-2.5 (3) MG/3ML nebulizer solution 3 mL  3 mL Nebulization Q6H Orma Flaming, MD   3 mL at 03/22/22 0850   levalbuterol (XOPENEX) nebulizer solution 0.63 mg  0.63 mg Nebulization Q6H PRN Orma Flaming, MD       metoprolol tartrate (LOPRESSOR) injection 5 mg  5 mg Intravenous Q8H PRN Thurnell Lose, MD       nicotine (NICODERM CQ - dosed in mg/24 hours) patch 14 mg  14  mg Transdermal Daily Orma Flaming, MD   14 mg at 03/22/22 8502   Oral care mouth rinse  15 mL Mouth Rinse PRN Orma Flaming, MD       pantoprazole (PROTONIX) injection 40 mg  40 mg Intravenous Candis Shine, MD   40 mg at 03/22/22 0836   sodium chloride flush (NS) 0.9 % injection 3 mL  3 mL Intravenous Q12H Orma Flaming, MD       sodium chloride flush (NS) 0.9 % injection 3 mL  3 mL Intravenous PRN Orma Flaming, MD        Allergies as of 03/21/2022 - Review Complete 03/21/2022  Allergen Reaction Noted   Lisinopril Cough 06/29/2014    Social History   Socioeconomic History   Marital status: Single    Spouse name: Not on file   Number of children: 1   Years of education: Not on file   Highest education level: Not on file  Occupational History   Occupation: retired  Tobacco Use   Smoking status: Every Day    Packs/day: 0.50    Years: 47.00    Total pack years: 23.50    Types: Cigarettes   Smokeless tobacco: Never   Tobacco comments:    4/5 cigs  Vaping Use   Vaping Use: Never used  Substance and Sexual Activity  Alcohol use: Not Currently    Alcohol/week: 0.0 standard drinks of alcohol    Comment: last drink was before xmas   Drug use: No    Types: Cocaine, Marijuana    Comment: "nothing in 20 years"   Sexual activity: Not Currently  Other Topics Concern   Not on file  Social History Narrative   unemployed   Social Determinants of Health   Financial Resource Strain: Low Risk  (01/06/2021)   Overall Financial Resource Strain (CARDIA)    Difficulty of Paying Living Expenses: Not very hard  Food Insecurity: No Food Insecurity (01/06/2021)   Hunger Vital Sign    Worried About Running Out of Food in the Last Year: Never true    [place] in the Last Year: Never true  Transportation Needs: No Transportation Needs (01/06/2021)   PRAPARE - Hydrologist (Medical): No    Lack of Transportation (Non-Medical): No  Physical  Activity: Inactive (01/06/2021)   Exercise Vital Sign    Days of Exercise per Week: 0 days    Minutes of Exercise per Session: 0 min  Stress: No Stress Concern Present (01/06/2021)   [place]    Feeling of Stress : Not at all  Social Connections: Moderately Integrated (01/06/2021)   Social Connection and Isolation Panel [NHANES]    Frequency of Communication with Friends and Family: More than three times a week    Frequency of Social Gatherings with Friends and Family: Once a week    Attends Religious Services: More than 4 times per year    Active Member of [employer] or Organizations: Yes    Attends Music therapist: More than 4 times per year    Marital Status: Divorced  Intimate Partner Violence: Not At Risk (01/06/2021)   Humiliation, Afraid, Rape, and Kick questionnaire    Fear of Current or Ex-Partner: No    Emotionally Abused: No    Physically Abused: No    Sexually Abused: No    Review of Systems: All systems reviewed and negative except where noted in HPI.  Physical Exam: Vital signs in last 24 hours: Temp:  [97.4 F (36.3 C)-98.8 F (37.1 C)] 97.6 F (36.4 C) (10/12 0729) Pulse Rate:  [72-90] 90 (10/12 0729) Resp:  [13-20] 16 (10/12 0729) BP: (101-142)/(20-88) 131/88 (10/12 0729) SpO2:  [93 %-100 %] 93 % (10/12 0853) Weight:  [97.5 kg-99.8 kg] 97.5 kg (10/12 0047) Last BM Date : 03/21/22  General:  Alert male in NAD Psych:  Pleasant, cooperative. Normal mood and affect Eyes: Pupils equal, no icterus. Conjunctive pink Ears:  Normal auditory acuity Nose: No deformity, discharge or lesions Neck:  Supple, no masses felt Lungs:  Clear to auscultation.  Heart:  Regular rate, regular rhythm. 1-2_ BLE edema Abdomen:  Soft, nondistended, nontender, active bowel sounds, no masses felt Rectal :  Deferred Msk: Symmetrical without gross deformities.  Neurologic:  Alert, oriented, grossly normal  neurologically Skin:  Intact without significant lesions.    Intake/Output from previous day: 10/11 0701 - 10/12 0700 In: 845 [P.O.:480; I.V.:50; Blood:315] Out: -  Intake/Output this shift:  No intake/output data recorded.    Principal Problem:   Acute on chronic symptomatic anemia Active Problems:   Essential hypertension   CAD (coronary artery disease)   Nonischemic dilated cardiomyopathy (HCC)   Cigarette smoker   Type 2 diabetes mellitus (HCC)   COPD with acute exacerbation ([place])  Atrial flutter ([place])   Chronic respiratory failure with hypoxia ([place])   Weakness    Tye Savoy, NP-C @  03/22/2022, 11:00 AM

## 2022-03-22 NOTE — Anesthesia Preprocedure Evaluation (Signed)
Anesthesia Evaluation  Patient identified by MRN, date of birth, ID band Patient awake    Reviewed: Allergy & Precautions, NPO status , Patient's Chart, lab work & pertinent test results  Airway Mallampati: II  TM Distance: >3 FB Neck ROM: Full    Dental  (+) Dental Advisory Given, Missing   Pulmonary pneumonia, resolved, COPD,  COPD inhaler, Current Smoker and Patient abstained from smoking.,    Pulmonary exam normal breath sounds clear to auscultation       Cardiovascular hypertension, Pt. on medications + CAD and +CHF  Normal cardiovascular exam Rhythm:Irregular Rate:Normal  EKG 11/02/20 NSR, pulmonary disease pattern, RAD, inferior Q waves  Echo 05/02/2018 Left ventricle: The cavity size was normal. Wall thickness was normal. Systolic function was mildly reduced. The estimated ejection fraction was in the range of 45% to 50%. Wall motion was normal; there were no regional wall motion abnormalities. Doppler parameters are consistent with abnormal left ventricular relaxation (grade 1 diastolic dysfunction).  - Left atrium: The atrium was mildly dilated.   Impressions:   - EF has improved when compared to prior (35%)    Neuro/Psych  Headaches, Hx/o Cluster HA's negative psych ROS   GI/Hepatic Neg liver ROS, Coffee ground emesis Hx/o GI AVM's   Endo/Other  diabetesHyperlipidemia Hyperglycemia  Renal/GU negative Renal ROS  negative genitourinary   Musculoskeletal negative musculoskeletal ROS (+)   Abdominal Normal abdominal exam  (+)   Peds  Hematology negative hematology ROS (+) Blood dyscrasia, anemia , Xarelto therapy- last dose 11/10/20   Anesthesia Other Findings   Reproductive/Obstetrics                             Anesthesia Physical  Anesthesia Plan  ASA: III  Anesthesia Plan: MAC   Post-op Pain Management:    Induction: Intravenous  PONV Risk Score and Plan: 0  and Propofol infusion, Treatment may vary due to age or medical condition and TIVA  Airway Management Planned: Natural Airway and Mask  Additional Equipment: None  Intra-op Plan:   Post-operative Plan:   Informed Consent: I have reviewed the patients History and Physical, chart, labs and discussed the procedure including the risks, benefits and alternatives for the proposed anesthesia with the patient or authorized representative who has indicated his/her understanding and acceptance.     Dental advisory given  Plan Discussed with: CRNA  Anesthesia Plan Comments:         Anesthesia Quick Evaluation

## 2022-03-22 NOTE — Progress Notes (Signed)
PROGRESS NOTE                                                                                                                                                                                                             Patient Demographics:    Jared [name], is a 66 y.o. male, DOB - May 24, 1956, [id number]  Outpatient Primary MD for the patient is Charlott Rakes, MD    LOS - 0  Admit date - 03/21/2022    Chief Complaint  Patient presents with   Shortness of Breath       Brief Narrative (HPI from H&P)   66 y.o. male with medical history significant of CAD with hx of CABG, combined systolic and diastolic CHF, chronic respiratory failure on as needed oxygen at 2L at home, COPD, T2DM, hx of CVA, HTN, atrial flutter, IDA, tobacco abuse who presented to ED with complaints of shortness of breath and fatigue x 1 week. He states he has been having to use his oxygen 24/7. He states he has been so weak and this morning he was so weak he couldn't take a bath or go cook himself breakfast.  He has history of gastric ulcer with GI bleed in the past, he did have some upper abdominal discomfort several weeks ago, he also continues to take NSAID intermittently, 2 weeks ago he had black tarry stool x1 or 2.  Subsequently he has developed progressive weakness and exertional shortness of breath in the ER was found to be severely anemic with a hemoglobin of 6 and he was admitted to the hospital for further management.   Subjective:    [facility] today has, No headache, No chest pain, No abdominal pain - No Nausea, No new weakness tingling or numbness, no SOB.   Assessment  & Plan :    Acute on chronic symptomatic anemia - 66 year old male with history of IDA with iron transfusions as well as upper GI leed in 12/2020 secondary to h.pylori and NSAID who continues to use NSAID intermittently with melanotic stool x2 several weeks ago, baseline  hemoglobin of around 15, hemoglobin upon presentation 6.  He has been transfused with 2 units of packed RBC is on IV PPI has been counseled not to use NSAIDs ever in the future.  GI has been consulted, continue to monitor CBC, IV  PPI and clear liquids.  Chronic hypoxic respiratory failure with chronic COPD with mild acute exacerbation upon admission  - no wheezing this morning, exertional shortness of likely due to anemia, due to likely underlying gastric ulcer will avoid further steroid use.  Continue oxygen and nebulizer treatments, he does have home oxygen.  Nonischemic dilated cardiomyopathy ([place])  - Echo 12/2021 EF of 45-50% with mildly decreased LVF and global hypokinesis. Grade 1DD, was decompensated due to severe anemia.  Monitor with transfusion.  Received Lasix with second unit of packed RBC on 03/22/2022.  Blood pressure has improved will commence Coreg.   Essential hypertension - blood pressure is improved resume Coreg.  Paroxysmal atrial flutter.  Mali vas 2 score of greater than 3.  Continue Coreg, hold Xarelto due to GI bleed.  CAD (coronary artery disease)  - No chest pain/palpitations. troponins flat at baseline, Continue zetia/lipitor, resume Coreg.  Cigarette smoker - No desire to quit, counseled, continue NicoDerm patch.  Type 2 diabetes mellitus (HCC)  - ISS  Lab Results  Component Value Date   HGBA1C 5.6 03/21/2022    CBG (last 3)  Recent Labs    03/21/22 1725 03/21/22 2225 03/22/22 0732  GLUCAP 136* 220* 149*        Condition - Fair  Family Communication  :  None  Code Status :  Full  Consults  :  GI  PUD Prophylaxis : PPI   Procedures  :            Disposition Plan  :    Status is: Observation  DVT Prophylaxis  :    SCDs Start: 03/21/22 1648   Lab Results  Component Value Date   PLT 177 03/22/2022    Diet :  Diet Order             Diet clear liquid Room service appropriate? Yes; Fluid consistency: Thin  Diet effective now                     Inpatient Medications  Scheduled Meds:  sodium chloride   Intravenous Once   atorvastatin  80 mg Oral Daily   ezetimibe  10 mg Oral Daily   ferrous sulfate  325 mg Oral BID WC   fluticasone furoate-vilanterol  1 puff Inhalation Daily   And   umeclidinium bromide  1 puff Inhalation Daily   folic acid  1 mg Oral Daily   gabapentin  300 mg Oral QHS   insulin aspart  0-9 Units Subcutaneous TID WC   ipratropium-albuterol  3 mL Nebulization Q6H   nicotine  14 mg Transdermal Daily   pantoprazole (PROTONIX) IV  40 mg Intravenous Q12H   sodium chloride flush  3 mL Intravenous Q12H   Continuous Infusions:  sodium chloride     PRN Meds:.sodium chloride, acetaminophen **OR** acetaminophen, levalbuterol, mouth rinse, sodium chloride flush  Antibiotics  :    Anti-infectives (From admission, onward)    None         Objective:   Vitals:   03/22/22 0309 03/22/22 0729 03/22/22 0851 03/22/22 0853  BP: (!) 142/69 131/88    Pulse: 72 90    Resp: 17 16    Temp: (!) 97.4 F (36.3 C) 97.6 F (36.4 C)    TempSrc: Oral Oral    SpO2: 100% 97% 95% 93%  Weight:      Height:        Wt Readings from Last 3 Encounters:  03/22/22 97.5 kg  02/13/22 103.4 kg  01/31/22 102.5 kg     Intake/Output Summary (Last 24 hours) at 03/22/2022 1011 Last data filed at 03/22/2022 0309 Gross per 24 hour  Intake 845 ml  Output --  Net 845 ml     Physical Exam  Awake Alert, No new F.N deficits, Normal affect [place].AT,PERRAL Supple Neck, No JVD,   Symmetrical Chest wall movement, Good air movement bilaterally, CTAB RRR,No Gallops,Rubs or new Murmurs,  +ve B.Sounds, Abd Soft, No tenderness,   No Cyanosis, Clubbing or edema        Data Review:    CBC Recent Labs  Lab 03/21/22 1342 03/21/22 1750 03/21/22 2150 03/22/22 0551  WBC 6.2 6.4 6.0 4.2  HGB 6.0* 6.0* 7.1* 8.0*  HCT 22.3* 23.5* 25.0* 27.8*  PLT 192 197 186 177  MCV 76.9* 78.9* 79.1* 79.2*  MCH 20.7*  20.1* 22.5* 22.8*  MCHC 26.9* 25.5* 28.4* 28.8*  RDW 21.8* 22.1* 22.0* 21.9*  LYMPHSABS 1.0  --   --   --   MONOABS 0.1  --   --   --   EOSABS 0.1  --   --   --   BASOSABS 0.1  --   --   --     Electrolytes Recent Labs  Lab 03/21/22 1330 03/21/22 1342 03/21/22 1520 03/21/22 1750 03/21/22 2150 03/22/22 0551  NA  --  141  --   --   --  142  K  --  3.6  --   --   --  3.9  CL  --  101  --   --   --  97*  CO2  --  33*  --   --   --  33*  GLUCOSE  --  117*  --   --   --  173*  BUN  --  9  --   --   --  8  CREATININE  --  0.84  --   --   --  0.76  CALCIUM  --  8.8*  --   --   --  9.4  MG  --   --   --   --  1.9  --   INR  --   --   --   --  1.1  --   TSH  --   --  0.373  --   --   --   HGBA1C  --   --   --  5.6  --   --   BNP 379.5*  --   --   --   --   --     ------------------------------------------------------------------------------------------------------------------ No results for input(s): "CHOL", "HDL", "LDLCALC", "TRIG", "CHOLHDL", "LDLDIRECT" in the last 72 hours.  Lab Results  Component Value Date   HGBA1C 5.6 03/21/2022    Recent Labs    03/21/22 1520  TSH 0.373   Radiology Reports DG Chest 2 View  Result Date: 03/21/2022 CLINICAL DATA:  Shortness of breath EXAM: CHEST - 2 VIEW COMPARISON:  Chest x-ray dated January 03, 2022 FINDINGS: Unchanged cardiomegaly. Lungs are clear. No pleural effusion or pneumothorax. Old left-sided rib fractures. IMPRESSION: No acute cardiopulmonary abnormality. Electronically Signed   By: Yetta Glassman M.D.   On: 03/21/2022 13:32      Signature  Lala Lund M.D on 03/22/2022 at 10:11 AM   -  To page go to [email or web address]

## 2022-03-22 NOTE — Op Note (Signed)
[facility] Patient Name: Jared [name] Procedure Date : 03/22/2022 MRN: [id number] Attending MD: Carlota Raspberry. Havery Moros , MD Date of Birth: Jan 01, 1956 CSN: [id number] Age: 66 Admit Type: Inpatient Procedure:                Small bowel enteroscopy Indications:              Melena / dark stools on Xarelto - history of PUD                            and small bowel AVMs. Has been taking NSAIDs                            recently Providers:                Remo Lipps P. Havery Moros, MD, Doristine Johns, RN,                            Cherylynn Ridges, Technician Referring MD:              Medicines:                Monitored Anesthesia Care Complications:            No immediate complications. Estimated blood loss:                            Minimal. Estimated Blood Loss:     Estimated blood loss was minimal. Procedure:                Pre-Anesthesia Assessment:                           - Prior to the procedure, a History and Physical                            was performed, and patient medications and                            allergies were reviewed. The patient's tolerance of                            previous anesthesia was also reviewed. The risks                            and benefits of the procedure and the sedation                            options and risks were discussed with the patient.                            All questions were answered, and informed consent                            was obtained. Prior Anticoagulants: The patient has  taken Xarelto (rivaroxaban), last dose was 2 days                            prior to procedure. ASA Grade Assessment: III - A                            patient with severe systemic disease. After                            reviewing the risks and benefits, the patient was                            deemed in satisfactory condition to undergo the                            procedure.                            After obtaining informed consent, the endoscope was                            passed under direct vision. Throughout the                            procedure, the patient's blood pressure, pulse, and                            oxygen saturations were monitored continuously. The                            [id number]-H190TL ([id number] Olympus slim colonoscope was                            introduced through the mouth and advanced to the                            proximal jejunum. The small bowel enteroscopy was                            accomplished without difficulty. The patient                            tolerated the procedure well. Scope In: Scope Out: Findings:      The Z-line was slightly irregular.      The exam of the esophagus was otherwise normal.      Hemostasis clip x 2 were found in the gastric fundus from prior clipping       in June.      The exam of the stomach was otherwise normal.      Two angiodysplastic lesions with no bleeding were found in the third       portion of the duodenum. Fulguration to ablate the lesions to prevent       bleeding by argon plasma was successful.      The exam of the duodenum was otherwise normal.  A single angiodysplastic lesion with no bleeding was found in the       proximal jejunum, just proximal to a prior tattoo from prior       enteroscopy. Fulguration to ablate the lesion to prevent bleeding by       argon plasma was successful.      A tattoo was seen in the proximal jejunum. The rest of the examined       jejunum was normal. Impression:               - Z-line irregular.                           - Normal esophagus                           - Hemostasis clip x 2 were found in the stomach.                           - Normal stomach otherwise                           - Two non-bleeding angiodysplastic lesions in the                            duodenum. Treated with argon plasma coagulation                            (APC).                            - A single non-bleeding angiodysplastic lesion in                            the duodenum. Treated with argon plasma coagulation                            (APC).                           - A tattoo was seen in the jejunum.                           - Normal small bowel otherwise.                           No evidence of any peptic ulcer disease in regards                            to recent NSAID use. 3 small bowel AVMs treated,                            could have been the cause in the setting of Xarelto                            use. Possible there are additional small bowel AVMs  further distal in the bowel. Will await course post                            enteroscopy. Recommendation:           - Return patient to hospital ward for ongoing care.                           - Clear liquid diet.                           - Continue present medications.                           - Continue to hold Xarelto                           - Repeat Hgb in the AM                           - Call with questions or changes in the patient's                            status in the interim.                           - GI service will re-evaluate the patient tomorrow Procedure Code(s):        --- Professional ---                           [phone], Small intestinal endoscopy, enteroscopy                            beyond second portion of duodenum, not including                            ileum; with control of bleeding (eg, injection,                            bipolar cautery, unipolar cautery, laser, heater                            probe, stapler, plasma coagulator) Diagnosis Code(s):        --- Professional ---                           K22.8, Other specified diseases of esophagus                           T18.2XXA, Foreign body in stomach, initial encounter                           K26.819, Angiodysplasia of stomach and duodenum                             without bleeding  K92.1, Melena (includes Hematochezia) CPT copyright 2019 American Medical Association. All rights reserved. The codes documented in this report are preliminary and upon coder review may  be revised to meet current compliance requirements. Remo Lipps P. Deaven Urwin, MD 03/22/2022 1:06:35 PM This report has been signed electronically. Number of Addenda: 0

## 2022-03-22 NOTE — Progress Notes (Signed)
Pt requesting COVID booster, flu, and RSV vaccines. MD notified and states will put in for out patient.  Raelyn Number, RN

## 2022-03-22 NOTE — Plan of Care (Signed)
  Problem: Education: Goal: Ability to describe self-care measures that may prevent or decrease complications (Diabetes Survival Skills Education) will improve Outcome: Progressing   Problem: Coping: Goal: Ability to adjust to condition or change in health will improve Outcome: Progressing   Problem: Fluid Volume: Goal: Ability to maintain a balanced intake and output will improve Outcome: Progressing   Problem: Health Behavior/Discharge Planning: Goal: Ability to manage health-related needs will improve Outcome: Progressing   Problem: Education: Goal: Knowledge of disease or condition will improve Outcome: Progressing

## 2022-03-22 NOTE — Anesthesia Procedure Notes (Signed)
Procedure Name: MAC Date/Time: 03/22/2022 12:20 PM  Performed by: Lieutenant Diego, CRNAPre-anesthesia Checklist: Patient identified, Emergency Drugs available, Suction available, Patient being monitored and Timeout performed Patient Re-evaluated:Patient Re-evaluated prior to induction Oxygen Delivery Method: Nasal cannula Preoxygenation: Pre-oxygenation with 100% oxygen Induction Type: IV induction

## 2022-03-22 NOTE — Anesthesia Postprocedure Evaluation (Signed)
Anesthesia Post Note  Patient: Jared [name]  Procedure(s) Performed: ENTEROSCOPY HOT HEMOSTASIS (ARGON PLASMA COAGULATION/BICAP)     Patient location during evaluation: Endoscopy Anesthesia Type: MAC Level of consciousness: awake and sedated Pain management: pain level controlled Vital Signs Assessment: post-procedure vital signs reviewed and stable Respiratory status: spontaneous breathing Cardiovascular status: stable Postop Assessment: no apparent nausea or vomiting Anesthetic complications: no   No notable events documented.  Last Vitals:  Vitals:   03/22/22 1310 03/22/22 1325  BP: 116/67 123/73  Pulse: 74 73  Resp: 16 18  Temp:  36.4 C  SpO2: 90% 95%    Last Pain:  Vitals:   03/22/22 1325  TempSrc: Oral  PainSc: 0-No pain                 Huston Foley

## 2022-03-22 NOTE — Transfer of Care (Signed)
Immediate Anesthesia Transfer of Care Note  Patient: Jared [name]  Procedure(s) Performed: ENTEROSCOPY HOT HEMOSTASIS (ARGON PLASMA COAGULATION/BICAP)  Patient Location: Endoscopy Unit  Anesthesia Type:MAC  Level of Consciousness: drowsy  Airway & Oxygen Therapy: Patient Spontanous Breathing and Patient connected to nasal cannula oxygen  Post-op Assessment: Report given to RN and Post -op Vital signs reviewed and stable  Post vital signs: Reviewed and stable  Last Vitals:  Vitals Value Taken Time  BP    Temp    Pulse    Resp    SpO2      Last Pain:  Vitals:   03/22/22 1201  TempSrc: Temporal  PainSc: 0-No pain         Complications: No notable events documented.

## 2022-03-22 NOTE — Progress Notes (Signed)
Mobility Specialist Progress Note:   03/22/22 1218  Mobility  Activity Off unit   Will follow-up as time allows.   [facility] Surveyor, mining Chat only

## 2022-03-23 ENCOUNTER — Telehealth: Payer: Self-pay

## 2022-03-23 ENCOUNTER — Other Ambulatory Visit: Payer: Self-pay

## 2022-03-23 DIAGNOSIS — D649 Anemia, unspecified: Secondary | ICD-10-CM | POA: Diagnosis not present

## 2022-03-23 LAB — TYPE AND SCREEN
ABO/RH(D): O POS
Antibody Screen: NEGATIVE
Unit division: 0
Unit division: 0

## 2022-03-23 LAB — CBC
HCT: 29.3 % — ABNORMAL LOW (ref 39.0–52.0)
Hemoglobin: 8.6 g/dL — ABNORMAL LOW (ref 13.0–17.0)
MCH: 23.6 pg — ABNORMAL LOW (ref 26.0–34.0)
MCHC: 29.4 g/dL — ABNORMAL LOW (ref 30.0–36.0)
MCV: 80.3 fL (ref 80.0–100.0)
Platelets: 201 10*3/uL (ref 150–400)
RBC: 3.65 MIL/uL — ABNORMAL LOW (ref 4.22–5.81)
RDW: 23.5 % — ABNORMAL HIGH (ref 11.5–15.5)
WBC: 12.4 10*3/uL — ABNORMAL HIGH (ref 4.0–10.5)
nRBC: 0.6 % — ABNORMAL HIGH (ref 0.0–0.2)

## 2022-03-23 LAB — GLUCOSE, CAPILLARY
Glucose-Capillary: 178 mg/dL — ABNORMAL HIGH (ref 70–99)
Glucose-Capillary: 147 mg/dL — ABNORMAL HIGH (ref 70–99)
Glucose-Capillary: 164 mg/dL — ABNORMAL HIGH (ref 70–99)
Glucose-Capillary: 182 mg/dL — ABNORMAL HIGH (ref 70–99)

## 2022-03-23 LAB — BASIC METABOLIC PANEL
Anion gap: 7 (ref 5–15)
BUN: 5 mg/dL — ABNORMAL LOW (ref 8–23)
CO2: 36 mmol/L — ABNORMAL HIGH (ref 22–32)
Calcium: 9.2 mg/dL (ref 8.9–10.3)
Chloride: 101 mmol/L (ref 98–111)
Creatinine, Ser: 0.87 mg/dL (ref 0.61–1.24)
GFR, Estimated: >60 mL/min (ref >60)
Glucose, Bld: 127 mg/dL — ABNORMAL HIGH (ref 70–99)
Potassium: 4.2 mmol/L (ref 3.5–5.1)
Sodium: 144 mmol/L (ref 135–145)

## 2022-03-23 LAB — CBC WITH DIFFERENTIAL/PLATELET
Abs Immature Granulocytes: 0.1 10*3/uL — ABNORMAL HIGH (ref 0.00–0.07)
Basophils Absolute: 0 10*3/uL (ref 0.0–0.1)
Basophils Relative: 0 %
Eosinophils Absolute: 0 10*3/uL (ref 0.0–0.5)
Eosinophils Relative: 0 %
HCT: 29.2 % — ABNORMAL LOW (ref 39.0–52.0)
Hemoglobin: 8.1 g/dL — ABNORMAL LOW (ref 13.0–17.0)
Lymphocytes Relative: 10 %
Lymphs Abs: 0.8 10*3/uL (ref 0.7–4.0)
MCH: 22.3 pg — ABNORMAL LOW (ref 26.0–34.0)
MCHC: 27.7 g/dL — ABNORMAL LOW (ref 30.0–36.0)
MCV: 80.2 fL (ref 80.0–100.0)
Monocytes Absolute: 0.2 10*3/uL (ref 0.1–1.0)
Monocytes Relative: 2 %
Myelocytes: 1 %
Neutro Abs: 7.2 10*3/uL (ref 1.7–7.7)
Neutrophils Relative %: 87 %
Platelets: 181 10*3/uL (ref 150–400)
RBC: 3.64 MIL/uL — ABNORMAL LOW (ref 4.22–5.81)
RDW: 22.2 % — ABNORMAL HIGH (ref 11.5–15.5)
WBC: 8.3 10*3/uL (ref 4.0–10.5)
nRBC: 1.4 % — ABNORMAL HIGH (ref 0.0–0.2)
nRBC: 2 /100 WBC — ABNORMAL HIGH

## 2022-03-23 LAB — BPAM RBC
Blood Product Expiration Date: 202311032359
Blood Product Expiration Date: 202311132359
ISSUE DATE / TIME: 202310111728
ISSUE DATE / TIME: 202310120127
Unit Type and Rh: 5100
Unit Type and Rh: 5100

## 2022-03-23 LAB — BRAIN NATRIURETIC PEPTIDE: B Natriuretic Peptide: 621 pg/mL — ABNORMAL HIGH (ref 0.0–100.0)

## 2022-03-23 LAB — MAGNESIUM: Magnesium: 2.2 mg/dL (ref 1.7–2.4)

## 2022-03-23 LAB — PATHOLOGIST SMEAR REVIEW

## 2022-03-23 MED ORDER — FUROSEMIDE 10 MG/ML IJ SOLN
40.0000 mg | Freq: Once | INTRAMUSCULAR | Status: AC
  Administered 2022-03-23: 40 mg via INTRAVENOUS
  Filled 2022-03-23: qty 4

## 2022-03-23 MED ORDER — RIVAROXABAN 20 MG PO TABS
20.0000 mg | ORAL_TABLET | Freq: Every day | ORAL | Status: DC
  Administered 2022-03-23: 20 mg via ORAL
  Filled 2022-03-23: qty 1

## 2022-03-23 NOTE — Telephone Encounter (Signed)
-----   Message from Sharyn Creamer, MD sent at 03/23/2022 10:03 AM EDT ----- Jared [name], please arrange for GI follow up with Dr. Loletha Carrow or APP in 1 month for IDA.  Thanks, Lyndee Leo

## 2022-03-23 NOTE — Progress Notes (Signed)
PROGRESS NOTE                                                                                                                                                                                                             Patient Demographics:    Jared [name], is a 66 y.o. male, DOB - 1955/07/04, IP:[id number]  Outpatient Primary MD for the patient is Charlott Rakes, MD    LOS - 0  Admit date - 03/21/2022    Chief Complaint  Patient presents with   Shortness of Breath       Brief Narrative (HPI from H&P)   66 y.o. male with medical history significant of CAD with hx of CABG, combined systolic and diastolic CHF, chronic respiratory failure on as needed oxygen at 2L at home, COPD, T2DM, hx of CVA, HTN, atrial flutter, IDA, tobacco abuse who presented to ED with complaints of shortness of breath and fatigue x 1 week. He states he has been having to use his oxygen 24/7. He states he has been so weak and this morning he was so weak he couldn't take a bath or go cook himself breakfast.  He has history of gastric ulcer with GI bleed in the past, he did have some upper abdominal discomfort several weeks ago, he also continues to take NSAID intermittently, 2 weeks ago he had black tarry stool x1 or 2.  Subsequently he has developed progressive weakness and exertional shortness of breath in the ER was found to be severely anemic with a hemoglobin of 6 and he was admitted to the hospital for further management.   Subjective:   Patient in bed denies any headache chest or abdominal pain, adamant to go home today, counseled to stay.   Assessment  & Plan :    Acute on chronic symptomatic anemia - 66 year old male with history of IDA with iron transfusions as well as upper GI leed in 12/2020 secondary to h.pylori and NSAID who continues to use NSAID intermittently with melanotic stool x2 several weeks ago, baseline hemoglobin of around 15,  hemoglobin upon presentation 6.  He has been transfused with 2 units of packed RBC is on IV PPI has been counseled not to use NSAIDs ever in the future.  GI consulted underwent EGD on 03/22/2022 showing no active bleeding but multiple AVMs which could  have been the site of bleeding.  Currently H&H is stable per GI resume Xarelto on 03/23/2022 and monitor H&H.  Patient admitted to go home counseled to stay for another 24 hours to be monitored on Xarelto.  Chronic hypoxic respiratory failure with chronic COPD with mild acute exacerbation upon admission  - no wheezing this morning, exertional shortness of likely due to anemia, due to likely underlying gastric ulcer will avoid further steroid use.  Continue oxygen and nebulizer treatments, he does have home oxygen.  Nonischemic dilated cardiomyopathy ([place])  - Echo 12/2021 EF of 45-50% with mildly decreased LVF and global hypokinesis. Grade 1DD, was decompensated due to severe anemia.  Monitor with transfusion.  Received Lasix with second unit of packed RBC on 03/22/2022.  Blood pressure has improved will commence Coreg.   Essential hypertension - blood pressure is improved resume Coreg.  Paroxysmal atrial flutter.  Mali vas 2 score of greater than 3.  Continue Coreg, anticoagulation as #1 above.  CAD (coronary artery disease)  - No chest pain/palpitations. troponins flat at baseline, Continue zetia/lipitor, resume Coreg.  Cigarette smoker - No desire to quit, counseled, continue NicoDerm patch.  Type 2 diabetes mellitus ([place])  - ISS  Lab Results  Component Value Date   HGBA1C 5.6 03/21/2022    CBG (last 3)  Recent Labs    03/22/22 1618 03/22/22 2121 03/23/22 0608  GLUCAP 216* 185* 178*        Condition - Fair  Family Communication  :  None  Code Status :  Full  Consults  :  GI  PUD Prophylaxis : PPI   Procedures  :      EGD 03/22/22 -  SBE 03/22/22: - Z-line irregular. - Normal esophagus - Hemostasis clip x 2 were found in  the stomach. - Normal stomach otherwise - Two non-bleeding angiodysplastic lesions in the duodenum. Treated with argon plasma coagulation (APC). - A single non-bleeding angiodysplastic lesion in the duodenum. Treated with argon plasma coagulation (APC). - A tattoo was seen in the jejunum. - Normal small bowel otherwise. No evidence of any peptic ulcer disease in regards to recent NSAID use. 3 small bowel AVMs treated, could have been the cause in the setting of Xarelto use. Possible there are additional small bowel AVMs further distal in the bowel. Will await course post enteroscopy      Disposition Plan  :    Status is: Observation  DVT Prophylaxis  :    SCDs Start: 03/21/22 1648   Lab Results  Component Value Date   PLT 181 03/23/2022    Diet :  Diet Order             DIET SOFT Room service appropriate? Yes; Fluid consistency: Thin  Diet effective now                    Inpatient Medications  Scheduled Meds:  sodium chloride   Intravenous Once   atorvastatin  80 mg Oral Daily   carvedilol  3.125 mg Oral BID WC   ezetimibe  10 mg Oral Daily   ferrous sulfate  325 mg Oral BID WC   fluticasone furoate-vilanterol  1 puff Inhalation Daily   And   umeclidinium bromide  1 puff Inhalation Daily   folic acid  1 mg Oral Daily   gabapentin  300 mg Oral QHS   insulin aspart  0-9 Units Subcutaneous TID WC   nicotine  14 mg Transdermal Daily   pantoprazole (PROTONIX)  IV  40 mg Intravenous Q12H   sodium chloride flush  3 mL Intravenous Q12H   Continuous Infusions:  sodium chloride     PRN Meds:.sodium chloride, acetaminophen **OR** acetaminophen, levalbuterol, metoprolol tartrate, mouth rinse, sodium chloride flush  Antibiotics  :    Anti-infectives (From admission, onward)    None         Objective:   Vitals:   03/23/22 0318 03/23/22 0618 03/23/22 0752 03/23/22 0922  BP: 126/61  (!) 156/93   Pulse: 65  84   Resp: 16  18   Temp: (!) 97.3 F (36.3  C)  98.1 F (36.7 C)   TempSrc: Oral  Oral   SpO2: 100%  100% 99%  Weight:  97.9 kg    Height:        Wt Readings from Last 3 Encounters:  03/23/22 97.9 kg  02/13/22 103.4 kg  01/31/22 102.5 kg     Intake/Output Summary (Last 24 hours) at 03/23/2022 1016 Last data filed at 03/23/2022 0758 Gross per 24 hour  Intake 850 ml  Output 4315 ml  Net -3465 ml     Physical Exam  Awake Alert, No new F.N deficits, Normal affect [place].AT,PERRAL Supple Neck, No JVD,   Symmetrical Chest wall movement, Good air movement bilaterally, CTAB RRR,No Gallops, Rubs or new Murmurs,  +ve B.Sounds, Abd Soft, No tenderness,   No Cyanosis, Clubbing or edema       Data Review:    CBC Recent Labs  Lab 03/21/22 1342 03/21/22 1750 03/21/22 2150 03/22/22 0551 03/22/22 1013 03/22/22 1825 03/23/22 0157  WBC 6.2   < > 6.0 4.2 4.3 7.0 8.3  HGB 6.0*   < > 7.1* 8.0* 8.0* 8.2* 8.1*  HCT 22.3*   < > 25.0* 27.8* 28.5* 28.9* 29.2*  PLT 192   < > 186 177 171 169 181  MCV 76.9*   < > 79.1* 79.2* 80.7 79.6* 80.2  MCH 20.7*   < > 22.5* 22.8* 22.7* 22.6* 22.3*  MCHC 26.9*   < > 28.4* 28.8* 28.1* 28.4* 27.7*  RDW 21.8*   < > 22.0* 21.9* 22.0* 21.8* 22.2*  LYMPHSABS 1.0  --   --   --   --   --  0.8  MONOABS 0.1  --   --   --   --   --  0.2  EOSABS 0.1  --   --   --   --   --  0.0  BASOSABS 0.1  --   --   --   --   --  0.0   < > = values in this interval not displayed.    Electrolytes Recent Labs  Lab 03/21/22 1330 03/21/22 1342 03/21/22 1520 03/21/22 1750 03/21/22 2150 03/22/22 0551 03/23/22 0157  NA  --  141  --   --   --  142 144  K  --  3.6  --   --   --  3.9 4.2  CL  --  101  --   --   --  97* 101  CO2  --  33*  --   --   --  33* 36*  GLUCOSE  --  117*  --   --   --  173* 127*  BUN  --  9  --   --   --  8 5*  CREATININE  --  0.84  --   --   --  0.76 0.87  CALCIUM  --  8.8*  --   --   --  9.4 9.2  MG  --   --   --   --  1.9  --  2.2  INR  --   --   --   --  1.1  --   --   TSH  --    --  0.373  --   --   --   --   HGBA1C  --   --   --  5.6  --   --   --   BNP 379.5*  --   --   --   --   --  621.0*    ------------------------------------------------------------------------------------------------------------------ No results for input(s): "CHOL", "HDL", "LDLCALC", "TRIG", "CHOLHDL", "LDLDIRECT" in the last 72 hours.  Lab Results  Component Value Date   HGBA1C 5.6 03/21/2022    Recent Labs    03/21/22 1520  TSH 0.373   Radiology Reports DG Chest 2 View  Result Date: 03/21/2022 CLINICAL DATA:  Shortness of breath EXAM: CHEST - 2 VIEW COMPARISON:  Chest x-ray dated January 03, 2022 FINDINGS: Unchanged cardiomegaly. Lungs are clear. No pleural effusion or pneumothorax. Old left-sided rib fractures. IMPRESSION: No acute cardiopulmonary abnormality. Electronically Signed   By: Yetta Glassman M.D.   On: 03/21/2022 13:32      Signature  Lala Lund M.D on 03/23/2022 at 10:16 AM   -  To page go to [email or web address]

## 2022-03-23 NOTE — Progress Notes (Signed)
Mobility Specialist Progress Note:   03/23/22 1242  Mobility  Activity Refused mobility   Pt refusing all ambulation with no specified reason.  [facility] Surveyor, mining Chat only

## 2022-03-23 NOTE — Telephone Encounter (Signed)
Patient has been scheduled for a hospital f/u with Dr. Loletha Carrow on Tuesday, 04/24/22 at 11 am. Appt information will be on hospital discharge summary. I have also sent patient's appt information via MyChart and mailed a copy.

## 2022-03-23 NOTE — Progress Notes (Signed)
PT Cancellation Note  Patient Details Name: Jared [name] MRN: [id number] DOB: 1956-01-13   Cancelled Treatment:    Reason Eval/Treat Not Completed: Patient declined, no reason specified (pt adamently refuses mobility or therapy eval. Pt agitated with all care and states he is independent with all mobility without need for therapy eval. Will sign off. Please reorder should pt status change)   Draya Felker B Junior Huezo 03/23/2022, 7:58 AM [place] Office: [phone]

## 2022-03-23 NOTE — Plan of Care (Signed)

## 2022-03-23 NOTE — Progress Notes (Addendum)
Gastroenterology Inpatient Follow Up    Subjective: Denies any signs of bleeding. Last BM was 2 days ago. Denies N&V or ab pain. Able to eat without issues.  Objective: Vital signs in last 24 hours: Temp:  [97.3 F (36.3 C)-98.3 F (36.8 C)] 98.1 F (36.7 C) (10/13 0752) Pulse Rate:  [65-86] 84 (10/13 0752) Resp:  [14-20] 18 (10/13 0752) BP: (105-156)/(52-93) 156/93 (10/13 0752) SpO2:  [90 %-100 %] 99 % (10/13 0922) Weight:  [97.9 kg] 97.9 kg (10/13 0618) Last BM Date : 03/21/22  Intake/Output from previous day: 10/12 0701 - 10/13 0700 In: 850 [P.O.:850] Out: 3440 [Urine:3440] Intake/Output this shift: Total I/O In: -  Out: 875 [Urine:875]  General appearance: alert, cooperative with questions Resp: on [place] oxygen Cardio: regular rate GI: non-tender, soft Extremities: no BLE edema  Lab Results: Recent Labs    03/22/22 1013 03/22/22 1825 03/23/22 0157  WBC 4.3 7.0 8.3  HGB 8.0* 8.2* 8.1*  HCT 28.5* 28.9* 29.2*  PLT 171 169 181   BMET Recent Labs    03/21/22 1342 03/22/22 0551 03/23/22 0157  NA 141 142 144  K 3.6 3.9 4.2  CL 101 97* 101  CO2 33* 33* 36*  GLUCOSE 117* 173* 127*  BUN 9 8 5*  CREATININE 0.84 0.76 0.87  CALCIUM 8.8* 9.4 9.2   LFT No results for input(s): "PROT", "ALBUMIN", "AST", "ALT", "ALKPHOS", "BILITOT", "BILIDIR", "IBILI" in the last 72 hours. PT/INR Recent Labs    03/21/22 2150  LABPROT 14.4  INR 1.1   Hepatitis Panel No results for input(s): "HEPBSAG", "HCVAB", "HEPAIGM", "HEPBIGM" in the last 72 hours. C-Diff No results for input(s): "CDIFFTOX" in the last 72 hours.  Studies/Results: DG Chest 2 View  Result Date: 03/21/2022 CLINICAL DATA:  Shortness of breath EXAM: CHEST - 2 VIEW COMPARISON:  Chest x-ray dated January 03, 2022 FINDINGS: Unchanged cardiomegaly. Lungs are clear. No pleural effusion or pneumothorax. Old left-sided rib fractures. IMPRESSION: No acute cardiopulmonary abnormality. Electronically Signed   By:  Yetta Glassman M.D.   On: 03/21/2022 13:32    Medications: I have reviewed the patient's current medications. Scheduled:  sodium chloride   Intravenous Once   atorvastatin  80 mg Oral Daily   carvedilol  3.125 mg Oral BID WC   ezetimibe  10 mg Oral Daily   ferrous sulfate  325 mg Oral BID WC   fluticasone furoate-vilanterol  1 puff Inhalation Daily   And   umeclidinium bromide  1 puff Inhalation Daily   folic acid  1 mg Oral Daily   gabapentin  300 mg Oral QHS   insulin aspart  0-9 Units Subcutaneous TID WC   nicotine  14 mg Transdermal Daily   pantoprazole (PROTONIX) IV  40 mg Intravenous Q12H   sodium chloride flush  3 mL Intravenous Q12H   Continuous:  sodium chloride     SWN:IOEVOJ chloride, acetaminophen **OR** acetaminophen, levalbuterol, metoprolol tartrate, mouth rinse, sodium chloride flush  SBE 03/22/22: - Z-line irregular. - Normal esophagus - Hemostasis clip x 2 were found in the stomach. - Normal stomach otherwise - Two non-bleeding angiodysplastic lesions in the duodenum. Treated with argon plasma coagulation (APC). - A single non-bleeding angiodysplastic lesion in the duodenum. Treated with argon plasma coagulation (APC). - A tattoo was seen in the jejunum. - Normal small bowel otherwise. No evidence of any peptic ulcer disease in regards to recent NSAID use. 3 small bowel AVMs treated, could have been the cause in the setting  of Xarelto use. Possible there are additional small bowel AVMs further distal in the bowel. Will await course post enteroscopy  Assessment/Plan: 66 y.o. male w/hx of PUD, AVMs, CAD with hx of CABG, HFrEF, chronic respiratory failure on PRN oxygen at 2L at home, COPD, T2DM, hx of CVA, HTN, atrial flutter on Xarelto, IDA, tobacco abuse presented with SOB. We were consulted for Hb drop and possible melena. He had SBE yesterday that showed 3 small bowel AVMs treated with APC. Hb has remained stable following the procedure. He has not  demonstrated any continued signs of bleeding. Okay to restart Xarelto today. - Okay to restart Xarelto from GI perspective - Continue to trend Hb - Recommend iron supplementation - Avoid NSAIDs - We will arrange for outpatient GI follow up - GI will sign off for now. Please call if any new questions arise.   LOS: 0 days   Imogene Burn 03/23/2022, 9:51 AM

## 2022-03-24 DIAGNOSIS — K31819 Angiodysplasia of stomach and duodenum without bleeding: Secondary | ICD-10-CM | POA: Diagnosis not present

## 2022-03-24 DIAGNOSIS — I2510 Atherosclerotic heart disease of native coronary artery without angina pectoris: Secondary | ICD-10-CM | POA: Diagnosis not present

## 2022-03-24 DIAGNOSIS — Z951 Presence of aortocoronary bypass graft: Secondary | ICD-10-CM | POA: Diagnosis not present

## 2022-03-24 DIAGNOSIS — I110 Hypertensive heart disease with heart failure: Secondary | ICD-10-CM | POA: Diagnosis not present

## 2022-03-24 DIAGNOSIS — Z20822 Contact with and (suspected) exposure to covid-19: Secondary | ICD-10-CM | POA: Diagnosis not present

## 2022-03-24 DIAGNOSIS — K921 Melena: Secondary | ICD-10-CM | POA: Diagnosis not present

## 2022-03-24 DIAGNOSIS — T182XXA Foreign body in stomach, initial encounter: Secondary | ICD-10-CM | POA: Diagnosis not present

## 2022-03-24 DIAGNOSIS — K2289 Other specified disease of esophagus: Secondary | ICD-10-CM | POA: Diagnosis not present

## 2022-03-24 DIAGNOSIS — D649 Anemia, unspecified: Secondary | ICD-10-CM | POA: Diagnosis not present

## 2022-03-24 LAB — CBC WITH DIFFERENTIAL/PLATELET
Abs Immature Granulocytes: 0.1 10*3/uL — ABNORMAL HIGH (ref 0.00–0.07)
Basophils Absolute: 0.1 10*3/uL (ref 0.0–0.1)
Basophils Relative: 1 %
Eosinophils Absolute: 0 10*3/uL (ref 0.0–0.5)
Eosinophils Relative: 0 %
HCT: 30.5 % — ABNORMAL LOW (ref 39.0–52.0)
Hemoglobin: 8.3 g/dL — ABNORMAL LOW (ref 13.0–17.0)
Lymphocytes Relative: 17 %
Lymphs Abs: 2 10*3/uL (ref 0.7–4.0)
MCH: 22.2 pg — ABNORMAL LOW (ref 26.0–34.0)
MCHC: 27.2 g/dL — ABNORMAL LOW (ref 30.0–36.0)
MCV: 81.6 fL (ref 80.0–100.0)
Monocytes Absolute: 0.7 10*3/uL (ref 0.1–1.0)
Monocytes Relative: 6 %
Neutro Abs: 9 10*3/uL — ABNORMAL HIGH (ref 1.7–7.7)
Neutrophils Relative %: 75 %
Platelets: 191 10*3/uL (ref 150–400)
Promyelocytes Relative: 1 %
RBC: 3.74 MIL/uL — ABNORMAL LOW (ref 4.22–5.81)
RDW: 23.8 % — ABNORMAL HIGH (ref 11.5–15.5)
WBC: 12 10*3/uL — ABNORMAL HIGH (ref 4.0–10.5)
nRBC: 0 /100 WBC
nRBC: 0.6 % — ABNORMAL HIGH (ref 0.0–0.2)

## 2022-03-24 LAB — GLUCOSE, CAPILLARY: Glucose-Capillary: 111 mg/dL — ABNORMAL HIGH (ref 70–99)

## 2022-03-24 LAB — BASIC METABOLIC PANEL
Anion gap: 5 (ref 5–15)
BUN: 14 mg/dL (ref 8–23)
CO2: 38 mmol/L — ABNORMAL HIGH (ref 22–32)
Calcium: 8.8 mg/dL — ABNORMAL LOW (ref 8.9–10.3)
Chloride: 99 mmol/L (ref 98–111)
Creatinine, Ser: 1.14 mg/dL (ref 0.61–1.24)
GFR, Estimated: >60 mL/min (ref >60)
Glucose, Bld: 139 mg/dL — ABNORMAL HIGH (ref 70–99)
Potassium: 3.6 mmol/L (ref 3.5–5.1)
Sodium: 142 mmol/L (ref 135–145)

## 2022-03-24 LAB — BRAIN NATRIURETIC PEPTIDE: B Natriuretic Peptide: 486.5 pg/mL — ABNORMAL HIGH (ref 0.0–100.0)

## 2022-03-24 LAB — MAGNESIUM: Magnesium: 2.2 mg/dL (ref 1.7–2.4)

## 2022-03-24 MED ORDER — FERROUS SULFATE 325 (65 FE) MG PO TABS: 325.0000 mg | ORAL_TABLET | Freq: Every day | ORAL | 0 refills | Status: DC

## 2022-03-24 MED ORDER — FOLIC ACID 1 MG PO TABS: 1.0000 mg | ORAL_TABLET | Freq: Every day | ORAL | 0 refills | Status: DC

## 2022-03-24 MED ORDER — FUROSEMIDE 40 MG PO TABS
40.0000 mg | ORAL_TABLET | Freq: Two times a day (BID) | ORAL | Status: DC
  Administered 2022-03-24: 40 mg via ORAL
  Filled 2022-03-24: qty 1

## 2022-03-24 MED ORDER — PANTOPRAZOLE SODIUM 40 MG PO TBEC: 40.0000 mg | DELAYED_RELEASE_TABLET | Freq: Every day | ORAL | 0 refills | Status: DC

## 2022-03-24 MED ORDER — POTASSIUM CHLORIDE CRYS ER 20 MEQ PO TBCR
40.0000 meq | EXTENDED_RELEASE_TABLET | Freq: Once | ORAL | Status: AC
  Administered 2022-03-24: 40 meq via ORAL
  Filled 2022-03-24: qty 2

## 2022-03-24 NOTE — Discharge Instructions (Signed)
Please do not take any NSAIDs like ibuprofen, Aleve, naproxen etc.    Follow with Primary MD Charlott Rakes, MD in 7 days also follow-up with your gastroenterologist within 1 to 2 weeks.  Get CBC, CMP, Magnesium, 2 view Chest X ray -  checked next visit within 1 week by Primary MD   Activity: As tolerated with Full fall precautions use walker/cane & assistance as needed  Disposition Home    Diet: Heart Healthy    Special Instructions: If you have smoked or chewed Tobacco  in the last 2 yrs please stop smoking, stop any regular Alcohol  and or any Recreational drug use.  On your next visit with your primary care physician please Get Medicines reviewed and adjusted.  Please request your Prim.MD to go over all Hospital Tests and Procedure/Radiological results at the follow up, please get all Hospital records sent to your Prim MD by signing hospital release before you go home.  If you experience worsening of your admission symptoms, develop shortness of breath, life threatening emergency, suicidal or homicidal thoughts you must seek medical attention immediately by calling 911 or calling your MD immediately  if symptoms less severe.  You Must read complete instructions/literature along with all the possible adverse reactions/side effects for all the Medicines you take and that have been prescribed to you. Take any new Medicines after you have completely understood and accpet all the possible adverse reactions/side effects.

## 2022-03-24 NOTE — Progress Notes (Signed)
Pt's HR is in the 120s-130s, Bp 128/81. Pt denies symptoms. Milderd Meager, Do notified.  Orders given.

## 2022-03-24 NOTE — Progress Notes (Signed)
Order to discharge pt home.  Discharge instructions/AVS given to patient and reviewed - education provided as needed.  Pt advised to call PCP and/or come back to the hospital if there are any problems. Pt verbalized understanding.    

## 2022-03-24 NOTE — Progress Notes (Signed)
Pt waiting for transport home - pt on home oxygen.  Pt to call out to desk when ride arrives at hospital entrance.  Will continue to monitor.

## 2022-03-24 NOTE — Discharge Summary (Signed)
Jared [name] ZOX:[id number] DOB: 1955-08-04 DOA: 03/21/2022  PCP: Charlott Rakes, MD  Admit date: 03/21/2022  Discharge date: 03/24/2022  Admitted From: Home   Disposition:  Home   Recommendations for Outpatient Follow-up:   Follow up with PCP in 1-2 weeks  PCP Please obtain BMP/CBC, 2 view CXR in 1week,  (see Discharge instructions)   PCP Please follow up on the following pending results:    Home Health: None   Equipment/Devices: None  Consultations: GI Discharge Condition: Stable    CODE STATUS: Full    Diet Recommendation: Heart Healthy with 1.5 L fluid restriction    Chief Complaint  Patient presents with   Shortness of Breath     Brief history of present illness from the day of admission and additional interim summary    66 y.o. male with medical history significant of CAD with hx of CABG, combined systolic and diastolic CHF, chronic respiratory failure on as needed oxygen at 2L at home, COPD, T2DM, hx of CVA, HTN, atrial flutter, IDA, tobacco abuse who presented to ED with complaints of shortness of breath and fatigue x 1 week. He states he has been having to use his oxygen 24/7. He states he has been so weak and this morning he was so weak he couldn't take a bath or go cook himself breakfast.   He has history of gastric ulcer with GI bleed in the past, he did have some upper abdominal discomfort several weeks ago, he also continues to take NSAID intermittently, 2 weeks ago he had black tarry stool x1 or 2.  Subsequently he has developed progressive weakness and exertional shortness of breath in the ER was found to be severely anemic with a hemoglobin of 6 and he was admitted to the hospital for further management.                                                                 Hospital Course     Acute on chronic symptomatic anemia - 66 year old male with history of IDA with iron transfusions as well as upper GI leed in 12/2020 secondary to h.pylori and NSAID who continues to use NSAID intermittently with melanotic stool x2 several weeks ago, baseline hemoglobin of around 15, hemoglobin upon presentation 6.  He has been transfused with 2 units of packed RBC is on IV PPI has been counseled not to use NSAIDs ever in the future.  GI consulted underwent EGD on 03/22/2022 showing no active bleeding but multiple AVMs which could have been the site of bleeding.  Currently H&H is stable per GI resume Xarelto on 03/23/2022 and monitor H&H.  H&H on 24 hours of Xarelto has been stable will be discharged home on once a day PPI with outpatient follow-up with PCP and GI, has been strictly  counseled not to use NSAID's..   Chronic hypoxic respiratory failure with chronic COPD with mild acute exacerbation upon admission  - no wheezing this morning, exertional shortness of likely due to anemia, due to likely underlying gastric ulcer will avoid further steroid use.  Continue oxygen and nebulizer treatments, he does have home oxygen.   Nonischemic dilated cardiomyopathy ([place])  - Echo 12/2021 EF of 45-50% with mildly decreased LVF and global hypokinesis. Grade 1DD, was decompensated due to severe anemia.  Monitor with transfusion.  Received Lasix with second unit of packed RBC on 03/22/2022.  Blood pressure has improved will commence Coreg.   Essential hypertension - blood pressure is improved resume Coreg.   Paroxysmal atrial flutter.  Mali vas 2 score of greater than 3.  Continue Coreg, anticoagulation as #1 above.   CAD (coronary artery disease)  - No chest pain/palpitations. troponins flat at baseline, Continue zetia/lipitor, resumed Coreg.   Cigarette smoker - No desire to quit, counseled.   Type 2 diabetes mellitus ([place])  - continue home regimen.  Marland Kitchen   Discharge diagnosis     Principal Problem:    Symptomatic anemia Active Problems:   COPD with acute exacerbation (HCC)   Nonischemic dilated cardiomyopathy (HCC)   Weakness   Chronic respiratory failure with hypoxia (HCC)   Essential hypertension   Atrial flutter (HCC)   Type 2 diabetes mellitus (HCC)   CAD (coronary artery disease)   Cigarette smoker   AVM (arteriovenous malformation) of small bowel, acquired   Anticoagulated   Upper GI bleed    Discharge instructions    Discharge Instructions     Discharge instructions   Complete by: As directed    Please do not take any NSAIDs like ibuprofen, Aleve, naproxen etc.    Follow with Primary MD Charlott Rakes, MD in 7 days also follow-up with your gastroenterologist within 1 to 2 weeks.  Get CBC, CMP, Magnesium, 2 view Chest X ray -  checked next visit within 1 week by Primary MD   Activity: As tolerated with Full fall precautions use walker/cane & assistance as needed  Disposition Home    Diet: Heart Healthy with 1.5 L fluid restriction per day  Special Instructions: If you have smoked or chewed Tobacco  in the last 2 yrs please stop smoking, stop any regular Alcohol  and or any Recreational drug use.  On your next visit with your primary care physician please Get Medicines reviewed and adjusted.  Please request your Prim.MD to go over all Hospital Tests and Procedure/Radiological results at the follow up, please get all Hospital records sent to your Prim MD by signing hospital release before you go home.   Increase activity slowly   Complete by: As directed        Discharge Medications   Allergies as of 03/24/2022       Reactions   Lisinopril Cough        Medication List     STOP taking these medications    fluticasone 50 MCG/ACT nasal spray Commonly known as: FLONASE   metFORMIN 500 MG tablet Commonly known as: GLUCOPHAGE   triamcinolone cream 0.1 % Commonly known as: KENALOG       TAKE these medications    Accu-Chek Guide test  strip Generic drug: glucose blood Use as instructed daily   Accu-Chek Guide w/Device Kit Use as directed three times daily   Accu-Chek Softclix Lancets lancets Use as instructed daily before meals   albuterol 108 (90 Base) MCG/ACT  inhaler Commonly known as: VENTOLIN HFA INHALE 2 PUFFS INTO THE LUNGS EVERY 6 (SIX) HOURS AS NEEDED FOR WHEEZING OR SHORTNESS OF BREATH. What changed: when to take this   albuterol (2.5 MG/3ML) 0.083% nebulizer solution Commonly known as: PROVENTIL Take 3 mLs (2.5 mg total) by nebulization every 6 (six) hours as needed for wheezing or shortness of breath. What changed: Another medication with the same name was changed. Make sure you understand how and when to take each.   ALKA-SELTZER ORIGINAL PO Take 1 packet by mouth 2 (two) times daily as needed (bloat).   atorvastatin 80 MG tablet Commonly known as: LIPITOR Take 1 tablet (80 mg total) by mouth daily.   Breztri Aerosphere 160-9-4.8 MCG/ACT Aero Generic drug: Budeson-Glycopyrrol-Formoterol Take 2 puffs first thing in morning and then another 2 puffs about 12 hours later. What changed:  how much to take how to take this when to take this additional instructions   carvedilol 3.125 MG tablet Commonly known as: COREG Take 1 tablet (3.125 mg total) by mouth 2 (two) times daily with a meal.   ezetimibe 10 MG tablet Commonly known as: ZETIA Take 1 tablet (10 mg total) by mouth daily.   ferrous sulfate 325 (65 FE) MG tablet Take 1 tablet (325 mg total) by mouth daily with breakfast.   folic acid 1 MG tablet Commonly known as: FOLVITE Take 1 tablet (1 mg total) by mouth daily.   furosemide 40 MG tablet Commonly known as: LASIX Take 1 tablet (40 mg total) by mouth 2 (two) times daily.   gabapentin 300 MG capsule Commonly known as: NEURONTIN TAKE 1 CAPSULE (300 MG TOTAL) BY MOUTH AT BEDTIME. What changed: how much to take   hydrALAZINE 50 MG tablet Commonly known as: APRESOLINE Take 1  tablet (50 mg total) by mouth every 8 (eight) hours.   isosorbide mononitrate 30 MG 24 hr tablet Commonly known as: IMDUR Take 1 tablet (30 mg total) by mouth daily.   nitroGLYCERIN 0.4 MG SL tablet Commonly known as: NITROSTAT Place 1 tablet (0.4 mg total) under the tongue every 5 (five) minutes as needed for chest pain.   OXYGEN Inhale 2 L/min into the lungs as directed. On exertion or when walking   pantoprazole 40 MG tablet Commonly known as: Protonix Take 1 tablet (40 mg total) by mouth daily.   Xarelto 20 MG Tabs tablet Generic drug: rivaroxaban TAKE 1 TABLET EVERY DAY WITH SUPPER (MUST HAVE OFFICE VISIT FOR REFILLS) What changed: See the new instructions.         Follow-up Information     Charlott Rakes, MD. Schedule an appointment as soon as possible for a visit in 1 week(s).   Specialty: Family Medicine Contact information: [address] [place] [address] [phone]                 Major procedures and Radiology Reports - PLEASE review detailed and final reports thoroughly  -        DG Chest 2 View  Result Date: 03/21/2022 CLINICAL DATA:  Shortness of breath EXAM: CHEST - 2 VIEW COMPARISON:  Chest x-ray dated January 03, 2022 FINDINGS: Unchanged cardiomegaly. Lungs are clear. No pleural effusion or pneumothorax. Old left-sided rib fractures. IMPRESSION: No acute cardiopulmonary abnormality. Electronically Signed   By: Yetta Glassman M.D.   On: 03/21/2022 13:32     Today   Subjective    Jared [name] today has no headache,no chest abdominal pain,no new weakness tingling  or numbness, feels much better wants to go home today.    Objective   Blood pressure 112/79, pulse (!) 101, temperature 97.9 F (36.6 C), temperature source Oral, resp. rate 18, height $RemoveBe'6\' 1"'NLqFcBFRS$  (1.854 m), weight 98.2 kg, SpO2 99 %.   Intake/Output Summary (Last 24 hours) at 03/24/2022 0920 Last data filed at 03/24/2022 0751 Gross per 24 hour  Intake 720  ml  Output 1226 ml  Net -506 ml    Exam  Awake Alert, No new F.N deficits,    [place].AT,PERRAL Supple Neck,   Symmetrical Chest wall movement, Good air movement bilaterally, CTAB RRR,No Gallops,   +ve B.Sounds, Abd Soft, Non tender,  No Cyanosis, Clubbing or edema    Data Review   Recent Labs  Lab 03/21/22 1342 03/21/22 1750 03/22/22 1013 03/22/22 1825 03/23/22 0157 03/23/22 2009 03/24/22 0109  WBC 6.2   < > 4.3 7.0 8.3 12.4* 12.0*  HGB 6.0*   < > 8.0* 8.2* 8.1* 8.6* 8.3*  HCT 22.3*   < > 28.5* 28.9* 29.2* 29.3* 30.5*  PLT 192   < > 171 169 181 201 191  MCV 76.9*   < > 80.7 79.6* 80.2 80.3 81.6  MCH 20.7*   < > 22.7* 22.6* 22.3* 23.6* 22.2*  MCHC 26.9*   < > 28.1* 28.4* 27.7* 29.4* 27.2*  RDW 21.8*   < > 22.0* 21.8* 22.2* 23.5* 23.8*  LYMPHSABS 1.0  --   --   --  0.8  --  2.0  MONOABS 0.1  --   --   --  0.2  --  0.7  EOSABS 0.1  --   --   --  0.0  --  0.0  BASOSABS 0.1  --   --   --  0.0  --  0.1   < > = values in this interval not displayed.    Recent Labs  Lab 03/21/22 1330 03/21/22 1342 03/21/22 1520 03/21/22 1750 03/21/22 2150 03/22/22 0551 03/23/22 0157 03/24/22 0109  NA  --  141  --   --   --  142 144 142  K  --  3.6  --   --   --  3.9 4.2 3.6  CL  --  101  --   --   --  97* 101 99  CO2  --  33*  --   --   --  33* 36* 38*  GLUCOSE  --  117*  --   --   --  173* 127* 139*  BUN  --  9  --   --   --  8 5* 14  CREATININE  --  0.84  --   --   --  0.76 0.87 1.14  CALCIUM  --  8.8*  --   --   --  9.4 9.2 8.8*  MG  --   --   --   --  1.9  --  2.2 2.2  INR  --   --   --   --  1.1  --   --   --   TSH  --   --  0.373  --   --   --   --   --   HGBA1C  --   --   --  5.6  --   --   --   --   BNP 379.5*  --   --   --   --   --  621.0* 486.5*  Total Time in preparing paper work, data evaluation and todays exam - 51 minutes  Lala Lund M.D on 03/24/2022 at 9:20 AM  [facility]

## 2022-03-24 NOTE — Plan of Care (Signed)
  Problem: Education: Goal: Ability to describe self-care measures that may prevent or decrease complications (Diabetes Survival Skills Education) will improve Outcome: Adequate for Discharge Goal: Individualized Educational Video(s) Outcome: Adequate for Discharge   Problem: Coping: Goal: Ability to adjust to condition or change in health will improve Outcome: Adequate for Discharge   Problem: Fluid Volume: Goal: Ability to maintain a balanced intake and output will improve Outcome: Adequate for Discharge   Problem: Health Behavior/Discharge Planning: Goal: Ability to identify and utilize available resources and services will improve Outcome: Adequate for Discharge Goal: Ability to manage health-related needs will improve Outcome: Adequate for Discharge   Problem: Metabolic: Goal: Ability to maintain appropriate glucose levels will improve Outcome: Adequate for Discharge   Problem: Nutritional: Goal: Maintenance of adequate nutrition will improve Outcome: Adequate for Discharge Goal: Progress toward achieving an optimal weight will improve Outcome: Adequate for Discharge   Problem: Skin Integrity: Goal: Risk for impaired skin integrity will decrease Outcome: Adequate for Discharge   Problem: Tissue Perfusion: Goal: Adequacy of tissue perfusion will improve Outcome: Adequate for Discharge   Problem: Education: Goal: Knowledge of disease or condition will improve Outcome: Adequate for Discharge Goal: Knowledge of the prescribed therapeutic regimen will improve Outcome: Adequate for Discharge Goal: Individualized Educational Video(s) Outcome: Adequate for Discharge   Problem: Activity: Goal: Ability to tolerate increased activity will improve Outcome: Adequate for Discharge Goal: Will verbalize the importance of balancing activity with adequate rest periods Outcome: Adequate for Discharge   Problem: Respiratory: Goal: Ability to maintain a clear airway will  improve Outcome: Adequate for Discharge Goal: Levels of oxygenation will improve Outcome: Adequate for Discharge Goal: Ability to maintain adequate ventilation will improve Outcome: Adequate for Discharge   Problem: Education: Goal: Knowledge of General Education information will improve Description: Including pain rating scale, medication(s)/side effects and non-pharmacologic comfort measures Outcome: Adequate for Discharge   Problem: Health Behavior/Discharge Planning: Goal: Ability to manage health-related needs will improve Outcome: Adequate for Discharge   Problem: Clinical Measurements: Goal: Ability to maintain clinical measurements within normal limits will improve Outcome: Adequate for Discharge Goal: Will remain free from infection Outcome: Adequate for Discharge Goal: Diagnostic test results will improve Outcome: Adequate for Discharge Goal: Respiratory complications will improve Outcome: Adequate for Discharge Goal: Cardiovascular complication will be avoided Outcome: Adequate for Discharge   Problem: Activity: Goal: Risk for activity intolerance will decrease Outcome: Adequate for Discharge   Problem: Nutrition: Goal: Adequate nutrition will be maintained Outcome: Adequate for Discharge   Problem: Coping: Goal: Level of anxiety will decrease Outcome: Adequate for Discharge   Problem: Elimination: Goal: Will not experience complications related to bowel motility Outcome: Adequate for Discharge Goal: Will not experience complications related to urinary retention Outcome: Adequate for Discharge   Problem: Pain Managment: Goal: General experience of comfort will improve Outcome: Adequate for Discharge   Problem: Safety: Goal: Ability to remain free from injury will improve Outcome: Adequate for Discharge   Problem: Skin Integrity: Goal: Risk for impaired skin integrity will decrease Outcome: Adequate for Discharge

## 2022-03-25 LAB — CULTURE, RESPIRATORY W GRAM STAIN: Culture: NORMAL

## 2022-03-26 ENCOUNTER — Telehealth: Payer: Self-pay

## 2022-03-26 NOTE — Telephone Encounter (Signed)
Transition Care Management Follow-up Telephone Call Date of discharge and from where: 03/24/2022, [facility] How have you been since you were released from the hospital? He stated he is feeling all right  Any questions or concerns? No  Items Reviewed: Did the pt receive and understand the discharge instructions provided? Yes  Medications obtained and verified? Yes - he said he has all of his medications as well as a working glucometer and nebulizer. He did not have any questions about the med regime.  Other? No  Any new allergies since your discharge? No  Dietary orders reviewed? Yes- he said he tries to eat heart healthy.  He does not adhere to a fluid restriction.  Do you have support at home?  He currently lives in a boarding house. His sister bring him some meals and he said he makes his own meals when she doesn't come to see him.   Home Care and Equipment/Supplies: Were home health services ordered? no If so, what is the name of the agency? N/a  Has the agency set up a time to come to the patient's home? not applicable Were any new equipment or medical supplies ordered?  No What is the name of the medical supply agency? N/a Were you able to get the supplies/equipment? not applicable Do you have any questions related to the use of the equipment or supplies? No  Functional Questionnaire: (I = Independent and D = Dependent) ADLs: independent. He stated that he has a walker to use if needed when he goes out.  He has not been using it when he is at home. He has home O2 that he uses at 2L when needed.   Follow up appointments reviewed:  PCP Hospital f/u appt confirmed? Yes  Scheduled to see Dr Margarita Rana - 04/25/2022. He did not want to be seen by another provider. [facility] f/u appt confirmed? Yes  Scheduled to see GI- 04/24/2022.   Are transportation arrangements needed? No  If their condition worsens, is the pt aware to call PCP or go to the Emergency Dept.? Yes Was  the patient provided with contact information for the PCP's office or ED? Yes Was to pt encouraged to call back with questions or concerns? Yes

## 2022-03-26 NOTE — Telephone Encounter (Signed)
From the discharge call:  He stated he is feeling all right. No questions/concerns at this time.    He has all of his medications as well as a working glucometer and nebulizer. He did not have any questions about the med regime.   He stated that he has a walker to use if needed when he goes out.  He has not been using it when he is at home.  He has home O2 that he uses at 2L when needed.   Scheduled to see Dr Margarita Rana - 04/25/2022. He did not want to be seen by another provider.

## 2022-03-27 ENCOUNTER — Encounter (HOSPITAL_COMMUNITY): Payer: Self-pay | Admitting: Gastroenterology

## 2022-03-28 ENCOUNTER — Ambulatory Visit: Payer: Medicare HMO | Attending: Internal Medicine

## 2022-03-28 DIAGNOSIS — I2510 Atherosclerotic heart disease of native coronary artery without angina pectoris: Secondary | ICD-10-CM | POA: Diagnosis not present

## 2022-03-28 DIAGNOSIS — Z79899 Other long term (current) drug therapy: Secondary | ICD-10-CM

## 2022-03-28 LAB — EXPECTORATED SPUTUM ASSESSMENT W GRAM STAIN, RFLX TO RESP C

## 2022-03-29 LAB — NMR, LIPOPROFILE
Cholesterol, Total: 109 mg/dL (ref 100–199)
HDL Particle Number: 28 umol/L — ABNORMAL LOW (ref >=30.5)
HDL-C: 45 mg/dL (ref >39)
LDL Particle Number: 591 nmol/L (ref <1000)
LDL Size: 20.8 nm (ref >20.5)
LDL-C (NIH Calc): 48 mg/dL (ref 0–99)
LP-IR Score: 44 (ref <=45)
Small LDL Particle Number: 368 nmol/L (ref <=527)
Triglycerides: 81 mg/dL (ref 0–149)

## 2022-04-10 ENCOUNTER — Other Ambulatory Visit: Payer: Self-pay

## 2022-04-10 ENCOUNTER — Other Ambulatory Visit: Payer: Self-pay | Admitting: Family Medicine

## 2022-04-10 DIAGNOSIS — I4892 Unspecified atrial flutter: Secondary | ICD-10-CM

## 2022-04-10 MED ORDER — ALBUTEROL SULFATE (2.5 MG/3ML) 0.083% IN NEBU
3.0000 mL | INHALATION_SOLUTION | Freq: Four times a day (QID) | RESPIRATORY_TRACT | 3 refills | Status: DC | PRN
  Filled 2022-04-10: qty 90, 8d supply, fill #0
  Filled 2022-04-26: qty 90, 8d supply, fill #1
  Filled 2022-05-21: qty 90, 8d supply, fill #2
  Filled 2022-11-06: qty 90, 8d supply, fill #3

## 2022-04-11 ENCOUNTER — Other Ambulatory Visit: Payer: Self-pay

## 2022-04-11 MED ORDER — RIVAROXABAN 20 MG PO TABS
20.0000 mg | ORAL_TABLET | Freq: Every day | ORAL | 0 refills | Status: DC
  Filled 2022-04-11: qty 30, 30d supply, fill #0

## 2022-04-12 ENCOUNTER — Other Ambulatory Visit: Payer: Self-pay

## 2022-04-16 ENCOUNTER — Other Ambulatory Visit: Payer: Self-pay

## 2022-04-24 ENCOUNTER — Other Ambulatory Visit (INDEPENDENT_AMBULATORY_CARE_PROVIDER_SITE_OTHER): Payer: Medicare HMO

## 2022-04-24 ENCOUNTER — Other Ambulatory Visit: Payer: Self-pay

## 2022-04-24 ENCOUNTER — Encounter: Payer: Self-pay | Admitting: Gastroenterology

## 2022-04-24 ENCOUNTER — Ambulatory Visit (INDEPENDENT_AMBULATORY_CARE_PROVIDER_SITE_OTHER): Payer: Medicare HMO | Admitting: Gastroenterology

## 2022-04-24 VITALS — BP 132/64 | HR 93 | Ht 74.0 in | Wt 214.0 lb

## 2022-04-24 DIAGNOSIS — Z7902 Long term (current) use of antithrombotics/antiplatelets: Secondary | ICD-10-CM

## 2022-04-24 DIAGNOSIS — R140 Abdominal distension (gaseous): Secondary | ICD-10-CM | POA: Diagnosis not present

## 2022-04-24 DIAGNOSIS — D500 Iron deficiency anemia secondary to blood loss (chronic): Secondary | ICD-10-CM | POA: Diagnosis not present

## 2022-04-24 LAB — CBC WITH DIFFERENTIAL/PLATELET
Basophils Absolute: 0.1 10*3/uL (ref 0.0–0.1)
Basophils Relative: 1.6 % (ref 0.0–3.0)
Eosinophils Absolute: 0.2 10*3/uL (ref 0.0–0.7)
Eosinophils Relative: 2.4 % (ref 0.0–5.0)
HCT: 35.7 % — ABNORMAL LOW (ref 39.0–52.0)
Hemoglobin: 10.4 g/dL — ABNORMAL LOW (ref 13.0–17.0)
Lymphocytes Relative: 32.2 % (ref 12.0–46.0)
Lymphs Abs: 2.4 10*3/uL (ref 0.7–4.0)
MCHC: 29 g/dL — ABNORMAL LOW (ref 30.0–36.0)
MCV: 79.2 fl (ref 78.0–100.0)
Monocytes Absolute: 0.8 10*3/uL (ref 0.1–1.0)
Monocytes Relative: 11.1 % (ref 3.0–12.0)
Neutro Abs: 3.9 10*3/uL (ref 1.4–7.7)
Neutrophils Relative %: 52.7 % (ref 43.0–77.0)
Platelets: 203 10*3/uL (ref 150.0–400.0)
RBC: 4.5 Mil/uL (ref 4.22–5.81)
RDW: 21.7 % — ABNORMAL HIGH (ref 11.5–15.5)
WBC: 7.5 10*3/uL (ref 4.0–10.5)

## 2022-04-24 LAB — IBC + FERRITIN
Ferritin: 8.1 ng/mL — ABNORMAL LOW (ref 22.0–322.0)
Iron: 21 ug/dL — ABNORMAL LOW (ref 42–165)
Saturation Ratios: 5.4 % — ABNORMAL LOW (ref 20.0–50.0)
TIBC: 386.4 ug/dL (ref 250.0–450.0)
Transferrin: 276 mg/dL (ref 212.0–360.0)

## 2022-04-24 MED ORDER — FERROUS SULFATE 325 (65 FE) MG PO TABS
325.0000 mg | ORAL_TABLET | Freq: Every day | ORAL | 0 refills | Status: DC
  Filled 2022-04-24: qty 30, 30d supply, fill #0

## 2022-04-24 MED ORDER — FERROUS SULFATE 325 (65 FE) MG PO TABS
325.0000 mg | ORAL_TABLET | Freq: Every day | ORAL | 1 refills | Status: DC
  Filled 2022-04-24 – 2022-04-26 (×2): qty 60, 60d supply, fill #0
  Filled 2022-06-17: qty 30, 30d supply, fill #0
  Filled 2022-09-05 – 2022-10-08 (×2): qty 30, 30d supply, fill #1
  Filled 2022-12-06 (×2): qty 30, 30d supply, fill #2
  Filled 2023-01-03: qty 30, 30d supply, fill #3

## 2022-04-24 NOTE — Progress Notes (Addendum)
[facility] Progress Note  Chief Complaint: Abdominal bloating and anemia of chronic GI blood loss  Subjective  History: Arya was last seen in the office 02/13/2022 for the following:  Abdominal bloating and discomfort with thorough endoscopic and radiographic work-up.  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   When I last saw him in the office, his hemoglobin had been doing well on supplemental iron.  He was admitted to the hospital on 03/21/2022 for progressive dyspnea and fatigue with reports of melena in the week or 2 prior.  He continued to use NSAIDs against advice even with a history of gastric ulcer.  Initial hemoglobin was 6, he was transfused 2 units of PRBCs.  Small bowel enteroscopy by Dr. Havery Moros revealed no ulcer.  Clips in the gastric fundus from previous endoscopic intervention.  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  Oral anticoagulation resumed at the time of discharge.  (Discharge summary as well as additional hospital data, consult and progress notes as well as Dr. Doyne Keel EGD report were reviewed.) _______________________________[name] follows up after the hospitalization.  He does not recall seeing any blood in his stools, but does confirm he had 1 or 2 episodes of black stool in the week or 2 prior to the hospital stay.  He was not taking iron regularly until that visit, and then was prescribed iron sulfate 325 mg once daily which he took until the supply ran out a day or 2 ago. Constipation occurred from the iron tablet, so he would prefer not to increase the dose if possible, but understands the importance of improving his hemoglobin and iron levels to help his dyspnea. He continues to have chronic dyspnea at rest requiring supplemental oxygen, and  unfortunately continues to smoke.  He also takes occasional Alka-Seltzer containing aspirin and was advised not to do so (also noted in hospital discharge summary).  He is back to taking his Xarelto regularly.  ROS: Cardiovascular:  no chest pain Respiratory: Chronic dyspnea as before Abdominal bloating as before The patient's Past Medical, Family and Social History were reviewed and are on file in the EMR.  Objective:  Med list reviewed  Current Outpatient Medications:    Accu-Chek Softclix Lancets lancets, Use as instructed daily before meals, Disp: 100 each, Rfl: 12   albuterol (PROVENTIL) (2.5 MG/3ML) 0.083% nebulizer solution, Take 3 mLs (2.5 mg total) by nebulization every 6 (six) hours as needed for wheezing or shortness of breath., Disp: 75 mL, Rfl: 3   albuterol (PROVENTIL) (2.5 MG/3ML) 0.083% nebulizer solution, Take 3 mLs by nebulization every 6 (six) hours as needed for wheezing or shortness of breath., Disp: 90 mL, Rfl: 3   albuterol (VENTOLIN HFA) 108 (90 Base) MCG/ACT inhaler, INHALE 2 PUFFS INTO THE LUNGS EVERY 6 (SIX) HOURS AS NEEDED FOR WHEEZING OR SHORTNESS OF BREATH. (Patient taking differently: Inhale 2 puffs into the lungs 3 (three) times daily as needed for wheezing or shortness of breath.), Disp: 18 g, Rfl: 5   Aspirin Effervescent (ALKA-SELTZER ORIGINAL PO), Take 1 packet by mouth 2 (two) times daily as needed (bloat)., Disp: , Rfl:    atorvastatin (LIPITOR) 80 MG tablet, Take 1 tablet (80 mg total) by mouth daily., Disp: 90 tablet, Rfl: 0   Blood Glucose Monitoring Suppl (ACCU-CHEK GUIDE) w/Device  KIT, Use as directed three times daily, Disp: 1 kit, Rfl: 0   Budeson-Glycopyrrol-Formoterol (BREZTRI AEROSPHERE) 160-9-4.8 MCG/ACT AERO, Take 2 puffs first thing in morning and then another 2 puffs about 12 hours later. (Patient taking differently: Inhale 2 puffs into the lungs in the morning and at bedtime.), Disp: 10.7 g, Rfl: 6   carvedilol (COREG) 3.125 MG tablet, Take  1 tablet (3.125 mg total) by mouth 2 (two) times daily with a meal., Disp: 180 tablet, Rfl: 1   ezetimibe (ZETIA) 10 MG tablet, Take 1 tablet (10 mg total) by mouth daily., Disp: 90 tablet, Rfl: 3   ferrous sulfate 325 (65 FE) MG tablet, Take 1 tablet (325 mg total) by mouth daily with breakfast., Disp: 30 tablet, Rfl: 0   folic acid (FOLVITE) 1 MG tablet, Take 1 tablet (1 mg total) by mouth daily., Disp: 30 tablet, Rfl: 0   furosemide (LASIX) 40 MG tablet, Take 1 tablet (40 mg total) by mouth 2 (two) times daily., Disp: 60 tablet, Rfl: 2   gabapentin (NEURONTIN) 300 MG capsule, TAKE 1 CAPSULE (300 MG TOTAL) BY MOUTH AT BEDTIME. (Patient taking differently: Take 300 mg by mouth at bedtime.), Disp: 90 capsule, Rfl: 1   glucose blood (ACCU-CHEK GUIDE) test strip, Use as instructed daily, Disp: 100 each, Rfl: 12   hydrALAZINE (APRESOLINE) 50 MG tablet, Take 1 tablet (50 mg total) by mouth every 8 (eight) hours., Disp: 270 tablet, Rfl: 1   isosorbide mononitrate (IMDUR) 30 MG 24 hr tablet, Take 1 tablet (30 mg total) by mouth daily., Disp: 90 tablet, Rfl: 1   OXYGEN, Inhale 2 L/min into the lungs as directed. On exertion or when walking, Disp: , Rfl:    pantoprazole (PROTONIX) 40 MG tablet, Take 1 tablet (40 mg total) by mouth daily., Disp: 30 tablet, Rfl: 0   rivaroxaban (XARELTO) 20 MG TABS tablet, Take 1 tablet (20 mg total) by mouth daily with supper., Disp: 30 tablet, Rfl: 0   nitroGLYCERIN (NITROSTAT) 0.4 MG SL tablet, Place 1 tablet (0.4 mg total) under the tongue every 5 (five) minutes as needed for chest pain., Disp: , Rfl:    Vital signs in last 24 hrs: Vitals:   04/24/22 1050  BP: 132/64  Pulse: 93   Wt Readings from Last 3 Encounters:  04/24/22 214 lb (97.1 kg)  03/24/22 216 lb 7.9 oz (98.2 kg)  02/13/22 228 lb (103.4 kg)    Physical Exam  Chronically ill-appearing. Cardiac: Regular without murmur,  no peripheral edema Pulm: Globally decreased breath sounds bilaterally, normal  RR and effort noted Abdomen: soft, no tenderness, with active bowel sounds. No guarding or palpable hepatosplenomegaly.  Labs:     Latest Ref Rng & Units 03/24/2022    1:09 AM 03/23/2022    8:09 PM 03/23/2022    1:57 AM  CBC  WBC 4.0 - 10.5 K/uL 12.0  12.4  8.3   Hemoglobin 13.0 - 17.0 g/dL 8.3  8.6  8.1   Hematocrit 39.0 - 52.0 % 30.5  29.3  29.2   Platelets 150 - 400 K/uL 191  201  181    Hgb was 6 on admission  Iron/TIBC/Ferritin/ %Sat  Iron 18, ferritin 3 on 03/21/22 ___________________________________________ Radiologic studies:   ____________________________________________ Other:   _____________________________________________ Assessment & Plan  Assessment: Encounter Diagnoses  Name Primary?   Iron deficiency anemia due to chronic blood loss Yes   Abdominal bloating    IDA from chronic occult GI blood loss and possibly more  acute blood loss based on the reports of black stool and degree of anemia at the time of admission.   Plan: Iron sulfate 325 mg at least once daily, twice daily if possible depending on constipation.  Take a stool softener to relieve constipation. Check CBC, iron studies today, dose iron accordingly and timing of repeat lab tests pending results.   Nelida Meuse III

## 2022-04-24 NOTE — Patient Instructions (Addendum)
_______________________________________________________  If you are age 68 or older, your body mass index should be between 23-30. Your Body mass index is 27.48 kg/m. If this is out of the aforementioned range listed, please consider follow up with your Primary Care Provider.  If you are age 61 or younger, your body mass index should be between 19-25. Your Body mass index is 27.48 kg/m. If this is out of the aformentioned range listed, please consider follow up with your Primary Care Provider.   ________________________________________________________  The [facility] providers would like to encourage you to use [facility] to communicate with providers for non-urgent requests or questions.  Due to long hold times on the telephone, sending your provider a message by [facility] may be a faster and more efficient way to get a response.  Please allow 48 business hours for a response.  Please remember that this is for non-urgent requests.  _______________________________________________________  _______________________________________________________  If you are age 39 or older, your body mass index should be between 23-30. Your Body mass index is 27.48 kg/m. If this is out of the aforementioned range listed, please consider follow up with your Primary Care Provider.  If you are age 39 or younger, your body mass index should be between 19-25. Your Body mass index is 27.48 kg/m. If this is out of the aformentioned range listed, please consider follow up with your Primary Care Provider.   ________________________________________________________  The [facility] providers would like to encourage you to use [facility] to communicate with providers for non-urgent requests or questions.  Due to long hold times on the telephone, sending your provider a message by [facility] may be a faster and more efficient way to get a response.  Please allow 48 business hours for a response.  Please remember that this is for  non-urgent requests.  _______________________________________________________  Due to recent changes in healthcare laws, you may see the results of your imaging and laboratory studies on MyChart before your provider has had a chance to review them.  We understand that in some cases there may be results that are confusing or concerning to you. Not all laboratory results come back in the same time frame and the provider may be waiting for multiple results in order to interpret others.  Please give [place] 48 hours in order for your provider to thoroughly review all the results before contacting the office for clarification of your results.   Take the iron tablets twice a day.  It was a pleasure to see you today!  Thank you for trusting me with your gastrointestinal care!

## 2022-04-25 ENCOUNTER — Other Ambulatory Visit: Payer: Self-pay

## 2022-04-25 ENCOUNTER — Ambulatory Visit: Payer: Medicare HMO | Attending: Family Medicine | Admitting: Family Medicine

## 2022-04-25 ENCOUNTER — Encounter: Payer: Self-pay | Admitting: Family Medicine

## 2022-04-25 ENCOUNTER — Ambulatory Visit: Payer: Medicare HMO | Admitting: Family Medicine

## 2022-04-25 VITALS — BP 101/64 | HR 98 | Temp 98.1°F | Ht 74.0 in | Wt 215.8 lb

## 2022-04-25 DIAGNOSIS — I4892 Unspecified atrial flutter: Secondary | ICD-10-CM | POA: Diagnosis not present

## 2022-04-25 DIAGNOSIS — J449 Chronic obstructive pulmonary disease, unspecified: Secondary | ICD-10-CM

## 2022-04-25 DIAGNOSIS — I420 Dilated cardiomyopathy: Secondary | ICD-10-CM

## 2022-04-25 DIAGNOSIS — Z23 Encounter for immunization: Secondary | ICD-10-CM | POA: Diagnosis not present

## 2022-04-25 DIAGNOSIS — I110 Hypertensive heart disease with heart failure: Secondary | ICD-10-CM

## 2022-04-25 DIAGNOSIS — J9612 Chronic respiratory failure with hypercapnia: Secondary | ICD-10-CM

## 2022-04-25 DIAGNOSIS — K250 Acute gastric ulcer with hemorrhage: Secondary | ICD-10-CM

## 2022-04-25 DIAGNOSIS — D500 Iron deficiency anemia secondary to blood loss (chronic): Secondary | ICD-10-CM

## 2022-04-25 DIAGNOSIS — J9611 Chronic respiratory failure with hypoxia: Secondary | ICD-10-CM | POA: Diagnosis not present

## 2022-04-25 DIAGNOSIS — I5042 Chronic combined systolic (congestive) and diastolic (congestive) heart failure: Secondary | ICD-10-CM

## 2022-04-25 MED ORDER — ATORVASTATIN CALCIUM 80 MG PO TABS
80.0000 mg | ORAL_TABLET | Freq: Every day | ORAL | 1 refills | Status: DC
  Filled 2022-04-25: qty 30, 30d supply, fill #0
  Filled 2022-05-21: qty 30, 30d supply, fill #1
  Filled 2022-06-17: qty 30, 30d supply, fill #2
  Filled 2022-07-26: qty 90, 90d supply, fill #3

## 2022-04-25 MED ORDER — ISOSORBIDE MONONITRATE ER 30 MG PO TB24
30.0000 mg | ORAL_TABLET | Freq: Every day | ORAL | 1 refills | Status: DC
  Filled 2022-04-25 – 2022-09-05 (×3): qty 90, 90d supply, fill #0
  Filled 2022-12-06 (×2): qty 90, 90d supply, fill #1

## 2022-04-25 MED ORDER — FOLIC ACID 1 MG PO TABS
1.0000 mg | ORAL_TABLET | Freq: Every day | ORAL | 0 refills | Status: DC
  Filled 2022-04-25: qty 30, 30d supply, fill #0

## 2022-04-25 MED ORDER — HYDRALAZINE HCL 50 MG PO TABS
50.0000 mg | ORAL_TABLET | Freq: Three times a day (TID) | ORAL | 1 refills | Status: DC
  Filled 2022-04-25 – 2022-09-05 (×3): qty 270, 90d supply, fill #0
  Filled 2022-12-06 (×2): qty 270, 90d supply, fill #1

## 2022-04-25 MED ORDER — CARVEDILOL 3.125 MG PO TABS
3.1250 mg | ORAL_TABLET | Freq: Two times a day (BID) | ORAL | 1 refills | Status: DC
  Filled 2022-04-25 – 2022-06-17 (×2): qty 180, 90d supply, fill #0

## 2022-04-25 MED ORDER — RIVAROXABAN 20 MG PO TABS
20.0000 mg | ORAL_TABLET | Freq: Every day | ORAL | 1 refills | Status: DC
  Filled 2022-04-25 – 2022-05-21 (×2): qty 90, 90d supply, fill #0
  Filled 2022-09-05: qty 90, 90d supply, fill #1

## 2022-04-25 MED ORDER — PANTOPRAZOLE SODIUM 40 MG PO TBEC
40.0000 mg | DELAYED_RELEASE_TABLET | Freq: Every day | ORAL | 1 refills | Status: DC
  Filled 2022-04-25: qty 30, 30d supply, fill #0
  Filled 2022-05-21: qty 30, 30d supply, fill #1

## 2022-04-25 MED ORDER — AREXVY 120 MCG/0.5ML IM SUSR
0.5000 mL | Freq: Once | INTRAMUSCULAR | 0 refills | Status: AC
  Filled 2022-04-25: qty 0.5, 1d supply, fill #0

## 2022-04-25 NOTE — Patient Instructions (Signed)
Edema ? ?Edema is when you have too much fluid in your body or under your skin. Edema may make your legs, feet, and ankles swell. Swelling often happens in looser tissues, such as around your eyes. This is a common condition. It gets more common as you get older. ?There are many possible causes of edema. These include: ?Eating too much salt (sodium). ?Being on your feet or sitting for a long time. ?Certain medical conditions, such as: ?Pregnancy. ?Heart failure. ?Liver disease. ?Kidney disease. ?Cancer. ?Hot weather may make edema worse. Edema is usually painless. Your skin may look swollen or shiny. ?Follow these instructions at home: ?Medicines ?Take over-the-counter and prescription medicines only as told by your doctor. ?Your doctor may prescribe a medicine to help your body get rid of extra water (diuretic). Take this medicine if you are told to take it. ?Eating and drinking ?Eat a low-salt (low-sodium) diet as told by your doctor. Sometimes, eating less salt may reduce swelling. ?Depending on the cause of your swelling, you may need to limit how much fluid you drink (fluid restriction). ?General instructions ?Raise the injured area above the level of your heart while you are sitting or lying down. ?Do not sit still or stand for a long time. ?Do not wear tight clothes. Do not wear garters on your upper legs. ?Exercise your legs. This can help the swelling go down. ?Wear compression stockings as told by your doctor. It is important that these are the right size. These should be prescribed by your doctor to prevent possible injuries. ?If elastic bandages or wraps are recommended, use them as told by your doctor. ?Contact a doctor if: ?Treatment is not working. ?You have heart, liver, or kidney disease and have symptoms of edema. ?You have sudden and unexplained weight gain. ?Get help right away if: ?You have shortness of breath or chest pain. ?You cannot breathe when you lie down. ?You have pain, redness, or  warmth in the swollen areas. ?You have heart, liver, or kidney disease and get edema all of a sudden. ?You have a fever and your symptoms get worse all of a sudden. ?These symptoms may be an emergency. Get help right away. Call 911. ?Do not wait to see if the symptoms will go away. ?Do not drive yourself to the hospital. ?Summary ?Edema is when you have too much fluid in your body or under your skin. ?Edema may make your legs, feet, and ankles swell. Swelling often happens in looser tissues, such as around your eyes. ?Raise the injured area above the level of your heart while you are sitting or lying down. ?Follow your doctor's instructions about diet and how much fluid you can drink. ?This information is not intended to replace advice given to you by your health care provider. Make sure you discuss any questions you have with your health care provider. ?Document Revised: 01/30/2021 Document Reviewed: 01/30/2021 ?Elsevier Patient Education ? 2023 [employer] ? ?

## 2022-04-25 NOTE — Progress Notes (Signed)
Subjective:  Patient ID: Jared [name], male    DOB: 1955-10-15  Age: 66 y.o. MRN: [id number]  CC: Hospitalization Follow-up   HPI Jared [name] is a 66 y.o. 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  Last month he was hospitalized for dyspnea secondary to COPD exacerbation, symptomatic anemia.  Hemoglobin on presentation was 6 and he received 2 units PRBC was treated with IV PPI.  He underwent EGD which revealed no active bleeding but multiple AVMs.  Interval History: Seen by GI yesterday for iron deficiency anemia and per notes thought to be due to blood loss anemia.  He was placed on ferrous sulfate and stool softener. He denies presence of dark stools.  He has resumed his Xarelto and denies presence of chest pain, palpitations, lightheadedness. At rest he can do without Oxygen but on exertion he needs oxygen. His COPD has been stable since discharge with no dyspnea.  He continues to smoke and complains of difficulty smoking.  States he is not ready. Denies additional concerns today. Past Medical History:  Diagnosis Date   AVM (arteriovenous malformation)    CAD (coronary artery disease)    a. LHC 5/12:  LAD 20, pLCx 20, pRCA 40, dRCA 40, EF 35%, diff HK  //  b. Myoview 4/16: Overall Impression:  High risk stress nuclear study There is no evidence of ischemia.  There is severe LV dysfunction. LV Ejection Fraction: 30%.  LV Wall Motion:  There is global LV hypokinesis.     CAP (community acquired pneumonia) 09/2013   Chronic combined systolic and diastolic CHF (congestive heart failure) ([place])    a. Echo 4/16:Mild LVH, EF 40-45%, diffuse HK //  b. Echo 8/17: EF 35-40%, diffuse HK, diastolic dysfunction, aortic  sclerosis, trivial MR, moderate LAE, normal RVSF, moderate RAE, mild TR, PASP 42 mmHg // c. Echo 4/18: Mild concentric LVH, EF 30-35, normal wall motion, grade 1 diastolic dysfunction, PASP 49   Chronic respiratory failure (HCC)    Cluster headache    "hx; haven't had one in awhile" (01/09/2016)   COPD (chronic obstructive pulmonary disease) (HCC)    [name] 01/09/2016   DM (diabetes mellitus) ([place])    History of CVA (cerebrovascular accident)    Hypertension    IDA (iron deficiency anemia)    Moderate tobacco use disorder    NICM (nonischemic cardiomyopathy) ([place])    Nicotine addiction    Tobacco abuse     Past Surgical History:  Procedure Laterality Date   BIOPSY  11/12/2020   Procedure: BIOPSY;  Surgeon: Irving Copas., MD;  Location: [facility];  Service: Gastroenterology;;   CARDIAC CATHETERIZATION  10/2010   LM normal, LAD with 20% irregularities, LCX with 20%, RCA with 40% prox and 40% distal - EF of 35%   CATARACT EXTRACTION, BILATERAL     COLONOSCOPY W/ BIOPSIES AND POLYPECTOMY     COLONOSCOPY WITH PROPOFOL N/A 09/06/2018   Procedure: COLONOSCOPY WITH PROPOFOL;  Surgeon: Thornton Park, MD;  Location: [place];  Service: Gastroenterology;  Laterality: N/A;   ENTEROSCOPY N/A 09/28/2018   Procedure: ENTEROSCOPY;  Surgeon: Carol Ada, MD;  Location: [place];  Service: Endoscopy;  Laterality: N/A;   ENTEROSCOPY N/A 10/28/2018   Procedure: ENTEROSCOPY;  Surgeon: Thornton Park,  MD;  Location: [place];  Service: Gastroenterology;  Laterality: N/A;   ENTEROSCOPY N/A 10/09/2020   Procedure: ENTEROSCOPY;  Surgeon: Irene Shipper, MD;  Location: [facility];  Service: Endoscopy;  Laterality: N/A;   ENTEROSCOPY N/A 03/22/2022   Procedure: ENTEROSCOPY;  Surgeon: Yetta Flock, MD;  Location: [facility] ENDOSCOPY;  Service: Gastroenterology;  Laterality: N/A;   ESOPHAGOGASTRODUODENOSCOPY N/A 11/12/2020   Procedure: ESOPHAGOGASTRODUODENOSCOPY (EGD);  Surgeon:  Irving Copas., MD;  Location: [facility];  Service: Gastroenterology;  Laterality: N/A;   ESOPHAGOGASTRODUODENOSCOPY (EGD) WITH PROPOFOL N/A 09/05/2018   Procedure: ESOPHAGOGASTRODUODENOSCOPY (EGD) WITH PROPOFOL;  Surgeon: Yetta Flock, MD;  Location: [place];  Service: Gastroenterology;  Laterality: N/A;   ESOPHAGOGASTRODUODENOSCOPY (EGD) WITH PROPOFOL N/A 11/19/2020   Procedure: ESOPHAGOGASTRODUODENOSCOPY (EGD) WITH PROPOFOL;  Surgeon: Jerene Bears, MD;  Location: [facility];  Service: Gastroenterology;  Laterality: N/A;   EXCISION MASS HEAD     GIVENS CAPSULE STUDY N/A 09/06/2018   Procedure: GIVENS CAPSULE STUDY;  Surgeon: Thornton Park, MD;  Location: [place];  Service: Gastroenterology;  Laterality: N/A;   GIVENS CAPSULE STUDY N/A 09/26/2018   Procedure: GIVENS CAPSULE STUDY;  Surgeon: Jerene Bears, MD;  Location: [place];  Service: Gastroenterology;  Laterality: N/A;   GIVENS CAPSULE STUDY N/A 06/14/2019   Procedure: GIVENS CAPSULE STUDY;  Surgeon: Mauri Pole, MD;  Location: [facility];  Service: Endoscopy;  Laterality: N/A;   HEMOSTASIS CLIP PLACEMENT  11/12/2020   Procedure: HEMOSTASIS CLIP PLACEMENT;  Surgeon: Irving Copas., MD;  Location: [facility];  Service: Gastroenterology;;   HEMOSTASIS CONTROL  11/12/2020   Procedure: HEMOSTASIS CONTROL;  Surgeon: Irving Copas., MD;  Location: [facility];  Service: Gastroenterology;;   HOT HEMOSTASIS N/A 10/28/2018   Procedure: HOT HEMOSTASIS (ARGON PLASMA COAGULATION/BICAP);  Surgeon: Thornton Park, MD;  Location: [place];  Service: Gastroenterology;  Laterality: N/A;   HOT HEMOSTASIS N/A 11/12/2020   Procedure: HOT HEMOSTASIS (ARGON PLASMA COAGULATION/BICAP);  Surgeon: Irving Copas., MD;  Location: [facility];  Service: Gastroenterology;  Laterality: N/A;   HOT HEMOSTASIS N/A 03/22/2022   Procedure: HOT HEMOSTASIS (ARGON PLASMA COAGULATION/BICAP);  Surgeon:  Yetta Flock, MD;  Location: [facility] ENDOSCOPY;  Service: Gastroenterology;  Laterality: N/A;   INCISION AND DRAINAGE PERIRECTAL ABSCESS N/A 06/05/2017   Procedure: IRRIGATION AND DEBRIDEMENT PERIRECTAL ABSCESS;  Surgeon: Ileana Roup, MD;  Location: [place];  Service: General;  Laterality: N/A;   SUBMUCOSAL TATTOO INJECTION  11/12/2020   Procedure: SUBMUCOSAL TATTOO INJECTION;  Surgeon: Irving Copas., MD;  Location: [facility];  Service: Gastroenterology;;   VIDEO BRONCHOSCOPY Bilateral 05/08/2016   Procedure: VIDEO BRONCHOSCOPY WITH FLUORO;  Surgeon: Rigoberto Noel, MD;  Location: [place];  Service: Cardiopulmonary;  Laterality: Bilateral;    Family History  Problem Relation Age of Onset   Heart disease Mother    Diabetes Mother    Colon cancer Mother    Liver cancer Mother    Cancer Father        type unknown   Diabetes Sister        x 2   Diabetes Brother     Social History   Socioeconomic History   Marital status: Single    Spouse name: Not on file   Number of children: 1   Years of education: Not on file   Highest education level: Not on file  Occupational History   Occupation: retired  Tobacco Use   Smoking status: Every Day    Packs/day:  0.50    Years: 47.00    Total pack years: 23.50    Types: Cigarettes   Smokeless tobacco: Never   Tobacco comments:    4/5 cigs  Vaping Use   Vaping Use: Never used  Substance and Sexual Activity   Alcohol use: Not Currently    Alcohol/week: 0.0 standard drinks of alcohol    Comment: last drink was before xmas   Drug use: No    Types: Cocaine, Marijuana    Comment: "nothing in 20 years"   Sexual activity: Not Currently  Other Topics Concern   Not on file  Social History Narrative   unemployed   Social Determinants of Health   Financial Resource Strain: Low Risk  (01/06/2021)   Overall Financial Resource Strain (CARDIA)    Difficulty of Paying Living Expenses: Not very hard  Food Insecurity: No  Food Insecurity (01/06/2021)   Hunger Vital Sign    Worried About Running Out of Food in the Last Year: Never true    Ran Out of Food in the Last Year: Never true  Transportation Needs: No Transportation Needs (01/06/2021)   PRAPARE - Hydrologist (Medical): No    Lack of Transportation (Non-Medical): No  Physical Activity: Inactive (01/06/2021)   Exercise Vital Sign    Days of Exercise per Week: 0 days    Minutes of Exercise per Session: 0 min  Stress: No Stress Concern Present (01/06/2021)   [place]    Feeling of Stress : Not at all  Social Connections: Moderately Integrated (01/06/2021)   Social Connection and Isolation Panel [NHANES]    Frequency of Communication with Friends and Family: More than three times a week    Frequency of Social Gatherings with Friends and Family: Once a week    Attends Religious Services: More than 4 times per year    Active Member of [employer] or Organizations: Yes    Attends Music therapist: More than 4 times per year    Marital Status: Divorced    Allergies  Allergen Reactions   Lisinopril Cough    Outpatient Medications Prior to Visit  Medication Sig Dispense Refill   Accu-Chek Softclix Lancets lancets Use as instructed daily before meals 100 each 12   albuterol (PROVENTIL) (2.5 MG/3ML) 0.083% nebulizer solution Take 3 mLs (2.5 mg total) by nebulization every 6 (six) hours as needed for wheezing or shortness of breath. 75 mL 3   albuterol (PROVENTIL) (2.5 MG/3ML) 0.083% nebulizer solution Take 3 mLs by nebulization every 6 (six) hours as needed for wheezing or shortness of breath. 90 mL 3   albuterol (VENTOLIN HFA) 108 (90 Base) MCG/ACT inhaler INHALE 2 PUFFS INTO THE LUNGS EVERY 6 (SIX) HOURS AS NEEDED FOR WHEEZING OR SHORTNESS OF BREATH. (Patient taking differently: Inhale 2 puffs into the lungs 3 (three) times daily as needed for wheezing or  shortness of breath.) 18 g 5   Aspirin Effervescent (ALKA-SELTZER ORIGINAL PO) Take 1 packet by mouth 2 (two) times daily as needed (bloat).     Blood Glucose Monitoring Suppl (ACCU-CHEK GUIDE) w/Device KIT Use as directed three times daily 1 kit 0   Budeson-Glycopyrrol-Formoterol (BREZTRI AEROSPHERE) 160-9-4.8 MCG/ACT AERO Take 2 puffs first thing in morning and then another 2 puffs about 12 hours later. (Patient taking differently: Inhale 2 puffs into the lungs in the morning and at bedtime.) 10.7 g 6   ezetimibe (ZETIA) 10 MG tablet Take  1 tablet (10 mg total) by mouth daily. 90 tablet 3   ferrous sulfate 325 (65 FE) MG tablet Take 1 tablet (325 mg total) by mouth daily with breakfast. 60 tablet 1   furosemide (LASIX) 40 MG tablet Take 1 tablet (40 mg total) by mouth 2 (two) times daily. 60 tablet 2   gabapentin (NEURONTIN) 300 MG capsule TAKE 1 CAPSULE (300 MG TOTAL) BY MOUTH AT BEDTIME. (Patient taking differently: Take 300 mg by mouth at bedtime.) 90 capsule 1   glucose blood (ACCU-CHEK GUIDE) test strip Use as instructed daily 100 each 12   nitroGLYCERIN (NITROSTAT) 0.4 MG SL tablet Place 1 tablet (0.4 mg total) under the tongue every 5 (five) minutes as needed for chest pain.     OXYGEN Inhale 2 L/min into the lungs as directed. On exertion or when walking     atorvastatin (LIPITOR) 80 MG tablet Take 1 tablet (80 mg total) by mouth daily. 90 tablet 0   carvedilol (COREG) 3.125 MG tablet Take 1 tablet (3.125 mg total) by mouth 2 (two) times daily with a meal. 481 tablet 1   folic acid (FOLVITE) 1 MG tablet Take 1 tablet (1 mg total) by mouth daily. 30 tablet 0   hydrALAZINE (APRESOLINE) 50 MG tablet Take 1 tablet (50 mg total) by mouth every 8 (eight) hours. 270 tablet 1   isosorbide mononitrate (IMDUR) 30 MG 24 hr tablet Take 1 tablet (30 mg total) by mouth daily. 90 tablet 1   pantoprazole (PROTONIX) 40 MG tablet Take 1 tablet (40 mg total) by mouth daily. 30 tablet 0   rivaroxaban  (XARELTO) 20 MG TABS tablet Take 1 tablet (20 mg total) by mouth daily with supper. 30 tablet 0   No facility-administered medications prior to visit.     ROS Review of Systems  Constitutional:  Negative for activity change and appetite change.  HENT:  Negative for sinus pressure and sore throat.   Respiratory:  Negative for chest tightness, shortness of breath and wheezing.   Cardiovascular:  Negative for chest pain and palpitations.  Gastrointestinal:  Negative for abdominal distention, abdominal pain and constipation.  Genitourinary: Negative.   Musculoskeletal: Negative.   Psychiatric/Behavioral:  Negative for behavioral problems and dysphoric mood.     Objective:  BP 101/64   Pulse 98   Temp 98.1 F (36.7 C) (Oral)   Ht _0  (1.88 m)   Wt 215 lb 12.8 oz (97.9 kg)   SpO2 96%   BMI 27.71 kg/m      04/25/2022    9:42 AM 04/24/2022   10:50 AM 03/24/2022    7:51 AM  BP/Weight  Systolic BP 856 314 970  Diastolic BP 64 64 79  Wt. (Lbs) 215.8 214   BMI 27.71 kg/m2 27.48 kg/m2       Physical Exam Constitutional:      Appearance: He is well-developed.  HENT:     Nose:     Comments: On 2 L of oxygen via nasal cannula Cardiovascular:     Rate and Rhythm: Normal rate.     Heart sounds: Normal heart sounds. No murmur heard. Pulmonary:     Effort: Pulmonary effort is normal.     Breath sounds: Normal breath sounds. No wheezing or rales.  Chest:     Chest wall: No tenderness.  Abdominal:     General: Bowel sounds are normal. There is no distension.     Palpations: Abdomen is soft. There is no mass.  Tenderness: There is no abdominal tenderness.  Musculoskeletal:        General: Normal range of motion.     Right lower leg: No edema.     Left lower leg: No edema.  Neurological:     Mental Status: He is alert and oriented to person, place, and time.  Psychiatric:        Mood and Affect: Mood normal.        Latest Ref Rng & Units 03/24/2022    1:09 AM  03/23/2022    1:57 AM 03/22/2022    5:51 AM  CMP  Glucose 70 - 99 mg/dL 139  127  173   BUN 8 - 23 mg/dL _0 Creatinine 0.61 - 1.24 mg/dL 1.14  0.87  0.76   Sodium 135 - 145 mmol/L 142  144  142   Potassium 3.5 - 5.1 mmol/L 3.6  4.2  3.9   Chloride 98 - 111 mmol/L 99  101  97   CO2 22 - 32 mmol/L 38  36  33   Calcium 8.9 - 10.3 mg/dL 8.8  9.2  9.4     Lipid Panel     Component Value Date/Time   CHOL 176 12/18/2021 1121   TRIG 167 (H) 12/18/2021 1121   HDL 58 12/18/2021 1121   CHOLHDL 2.9 01/29/2020 1020   CHOLHDL 2.8 01/11/2016 0446   VLDL 14 01/11/2016 0446   LDLCALC 89 12/18/2021 1121    CBC    Component Value Date/Time   WBC 7.5 04/24/2022 1144   RBC 4.50 04/24/2022 1144   HGB 10.4 Repeated and verified X2. (L) 04/24/2022 1144   HGB 17.1 09/20/2021 1620   HCT 35.7 Repeated and verified X2. (L) 04/24/2022 1144   HCT 53.6 (H) 09/20/2021 1620   PLT 203.0 04/24/2022 1144   PLT 236 09/20/2021 1620   MCV 79.2 04/24/2022 1144   MCV 88 09/20/2021 1620   MCH 22.2 (L) 03/24/2022 0109   MCHC 29.0 Repeated and verified X2. (L) 04/24/2022 1144   RDW 21.7 (H) 04/24/2022 1144   RDW 12.8 09/20/2021 1620   LYMPHSABS 2.4 04/24/2022 1144   LYMPHSABS 3.0 09/20/2021 1620   MONOABS 0.8 04/24/2022 1144   EOSABS 0.2 04/24/2022 1144   EOSABS 0.1 09/20/2021 1620   BASOSABS 0.1 04/24/2022 1144   BASOSABS 0.1 09/20/2021 1620    Lab Results  Component Value Date   HGBA1C 5.6 03/21/2022    Assessment & Plan:  1. Hypertensive heart disease with chronic combined systolic and diastolic congestive heart failure (HCC) EF 45 to 50% from echo of 12/2021 Euvolemic Continue hydralazine, isosorbide, beta-blocker. Consider SGLT2i at next visit - atorvastatin (LIPITOR) 80 MG tablet; Take 1 tablet (80 mg total) by mouth daily.  Dispense: 90 tablet; Refill: 1 - carvedilol (COREG) 3.125 MG tablet; Take 1 tablet (3.125 mg total) by mouth 2 (two) times daily with a meal.  Dispense: 180  tablet; Refill: 1 - hydrALAZINE (APRESOLINE) 50 MG tablet; Take 1 tablet (50 mg total) by mouth every 8 (eight) hours.  Dispense: 270 tablet; Refill: 1 - isosorbide mononitrate (IMDUR) 30 MG 24 hr tablet; Take 1 tablet (30 mg total) by mouth daily.  Dispense: 90 tablet; Refill: 1  2. Atrial flutter, unspecified type ([place]) Currently in sinus rhythm - rivaroxaban (XARELTO) 20 MG TABS tablet; Take 1 tablet (20 mg total) by mouth daily with supper.  Dispense: 90 tablet; Refill: 1  3. COPD GOLD III/still smoking  Stable with no acute flares Advised on smoking cessation will be beneficial Continue Breztri, MDI  4. Chronic respiratory failure with hypoxia and hypercapnia (HCC) Currently on 2 L of oxygen  5. Nonischemic dilated cardiomyopathy ([place]) See #1 above  6. Iron deficiency anemia due to chronic blood loss Secondary to gastric ulcer and AVMs Advised against NSAID use Last hemoglobin was 10.4 He is currently on ferrous sulfate He has repeat CBC scheduled 6 weeks by his GI. Continue to follow-up with GI  7. Flu vaccine need - Flu Vaccine QUAD High Dose(Fluad)  8. Acute gastric ulcer with hemorrhage Advised against NSAID use Continue PPI - pantoprazole (PROTONIX) 40 MG tablet; Take 1 tablet (40 mg total) by mouth daily.  Dispense: 30 tablet; Refill: 1   Meds ordered this encounter  Medications   atorvastatin (LIPITOR) 80 MG tablet    Sig: Take 1 tablet (80 mg total) by mouth daily.    Dispense:  90 tablet    Refill:  1   carvedilol (COREG) 3.125 MG tablet    Sig: Take 1 tablet (3.125 mg total) by mouth 2 (two) times daily with a meal.    Dispense:  180 tablet    Refill:  1   folic acid (FOLVITE) 1 MG tablet    Sig: Take 1 tablet (1 mg total) by mouth daily.    Dispense:  30 tablet    Refill:  0   hydrALAZINE (APRESOLINE) 50 MG tablet    Sig: Take 1 tablet (50 mg total) by mouth every 8 (eight) hours.    Dispense:  270 tablet    Refill:  1   isosorbide mononitrate  (IMDUR) 30 MG 24 hr tablet    Sig: Take 1 tablet (30 mg total) by mouth daily.    Dispense:  90 tablet    Refill:  1   pantoprazole (PROTONIX) 40 MG tablet    Sig: Take 1 tablet (40 mg total) by mouth daily.    Dispense:  30 tablet    Refill:  1   rivaroxaban (XARELTO) 20 MG TABS tablet    Sig: Take 1 tablet (20 mg total) by mouth daily with supper.    Dispense:  90 tablet    Refill:  1   RSV vaccine recomb adjuvanted (AREXVY) 120 MCG/0.5ML injection    Sig: Inject 0.5 mLs into the muscle once for 1 dose.    Dispense:  0.5 mL    Refill:  0    Follow-up: Return in about 3 months (around 07/26/2022) for Chronic medical conditions.       Charlott Rakes, MD, FAAFP. [facility] [place], [place]   04/25/2022, 12:59 PM

## 2022-04-26 ENCOUNTER — Other Ambulatory Visit: Payer: Self-pay

## 2022-04-27 ENCOUNTER — Other Ambulatory Visit: Payer: Self-pay

## 2022-04-30 ENCOUNTER — Telehealth: Payer: Self-pay | Admitting: Internal Medicine

## 2022-04-30 DIAGNOSIS — J9611 Chronic respiratory failure with hypoxia: Secondary | ICD-10-CM

## 2022-04-30 NOTE — Telephone Encounter (Signed)
Called and spoke to patient and he states he is already on oxygen with [employer] but is wanting an order for a POC to be sent in. I advised him that I would try to send the order in and see if it can get approved. Order for POC sent in. Nothing further needed

## 2022-05-01 ENCOUNTER — Other Ambulatory Visit: Payer: Self-pay

## 2022-05-02 DIAGNOSIS — Z961 Presence of intraocular lens: Secondary | ICD-10-CM | POA: Diagnosis not present

## 2022-05-02 DIAGNOSIS — H43813 Vitreous degeneration, bilateral: Secondary | ICD-10-CM | POA: Diagnosis not present

## 2022-05-02 DIAGNOSIS — E119 Type 2 diabetes mellitus without complications: Secondary | ICD-10-CM | POA: Diagnosis not present

## 2022-05-11 ENCOUNTER — Other Ambulatory Visit: Payer: Self-pay

## 2022-05-11 MED ORDER — COVID-19 MRNA 2023-2024 VACCINE (COMIRNATY) 0.3 ML INJECTION
0.3000 mL | Freq: Once | INTRAMUSCULAR | 0 refills | Status: AC
  Filled 2022-05-11: qty 0.3, 1d supply, fill #0

## 2022-05-21 ENCOUNTER — Other Ambulatory Visit: Payer: Self-pay

## 2022-05-21 ENCOUNTER — Other Ambulatory Visit: Payer: Self-pay | Admitting: Family Medicine

## 2022-05-22 ENCOUNTER — Other Ambulatory Visit: Payer: Self-pay

## 2022-05-22 MED ORDER — FOLIC ACID 1 MG PO TABS
1.0000 mg | ORAL_TABLET | Freq: Every day | ORAL | 2 refills | Status: DC
  Filled 2022-05-22: qty 30, 30d supply, fill #0
  Filled 2022-06-17: qty 30, 30d supply, fill #1
  Filled 2022-09-05: qty 30, 30d supply, fill #2

## 2022-05-25 ENCOUNTER — Ambulatory Visit: Payer: Medicare HMO | Attending: Family Medicine

## 2022-05-25 ENCOUNTER — Other Ambulatory Visit: Payer: Self-pay

## 2022-05-25 DIAGNOSIS — Z0000 Encounter for general adult medical examination without abnormal findings: Secondary | ICD-10-CM

## 2022-05-25 NOTE — Progress Notes (Signed)
Subjective:   Esiah Bazinet is a 66 y.o. male who presents for Medicare Annual/Subsequent preventive examination.  Review of Systems    connected with  Mr.Stelmach on  05/25/22 at  2:21 pm by telephone and verified that I am speaking with the correct person using two identifiers. I discussed the limitations, risks, security and privacy concerns of performing an evaluation and management service by telephone and the availability of in person appointments. I also discussed with the patient that there may be a patient responsible charge related to this service. The patient expressed understanding and agreed to proceed.  Patient location:  Home  [facility]  Persons on the telephone call:   Myself Bethann [place] )and Mr.Strehlow       Objective:    There were no vitals filed for this visit. There is no height or weight on file to calculate BMI.     05/25/2022    2:24 PM 03/22/2022   12:00 PM 03/21/2022    1:05 PM 12/11/2021    7:41 AM 02/11/2021   12:32 PM 01/30/2021   10:47 AM 01/06/2021    9:01 AM  Advanced Directives  Does Patient Have a Medical Advance Directive? Yes _0  No  Type of Advance Directive [place]        Would patient like information on creating a medical advance directive?   No - Patient declined No - Patient declined No - Patient declined No - Patient declined No - Patient declined    Current Medications (verified) Outpatient Encounter Medications as of 05/25/2022  Medication Sig   Accu-Chek Softclix Lancets lancets Use as instructed daily before meals   albuterol (PROVENTIL) (2.5 MG/3ML) 0.083% nebulizer solution Take 3 mLs by nebulization every 6 (six) hours as needed for wheezing or shortness of breath.   albuterol (VENTOLIN HFA) 108 (90 Base) MCG/ACT inhaler INHALE 2 PUFFS INTO THE LUNGS EVERY 6 (SIX) HOURS AS NEEDED FOR WHEEZING OR SHORTNESS OF BREATH. (Patient taking differently: Inhale 2 puffs into the lungs  3 (three) times daily as needed for wheezing or shortness of breath.)   atorvastatin (LIPITOR) 80 MG tablet Take 1 tablet (80 mg total) by mouth daily.   Blood Glucose Monitoring Suppl (ACCU-CHEK GUIDE) w/Device KIT Use as directed three times daily   carvedilol (COREG) 3.125 MG tablet Take 1 tablet (3.125 mg total) by mouth 2 (two) times daily with a meal.   ezetimibe (ZETIA) 10 MG tablet Take 1 tablet (10 mg total) by mouth daily.   folic acid (FOLVITE) 1 MG tablet Take 1 tablet (1 mg total) by mouth daily.   furosemide (LASIX) 40 MG tablet Take 1 tablet (40 mg total) by mouth 2 (two) times daily.   gabapentin (NEURONTIN) 300 MG capsule TAKE 1 CAPSULE (300 MG TOTAL) BY MOUTH AT BEDTIME. (Patient taking differently: Take 300 mg by mouth at bedtime.)   glucose blood (ACCU-CHEK GUIDE) test strip Use as instructed daily   hydrALAZINE (APRESOLINE) 50 MG tablet Take 1 tablet (50 mg total) by mouth every 8 (eight) hours.   isosorbide mononitrate (IMDUR) 30 MG 24 hr tablet Take 1 tablet (30 mg total) by mouth daily.   pantoprazole (PROTONIX) 40 MG tablet Take 1 tablet (40 mg total) by mouth daily.   rivaroxaban (XARELTO) 20 MG TABS tablet Take 1 tablet (20 mg total) by mouth daily with supper.   Aspirin Effervescent (ALKA-SELTZER ORIGINAL PO) Take 1 packet by mouth 2 (two)  times daily as needed (bloat). (Patient not taking: Reported on 05/25/2022)   Budeson-Glycopyrrol-Formoterol (BREZTRI AEROSPHERE) 160-9-4.8 MCG/ACT AERO Take 2 puffs first thing in morning and then another 2 puffs about 12 hours later. (Patient taking differently: Inhale 2 puffs into the lungs in the morning and at bedtime.)   ferrous sulfate 325 (65 FE) MG tablet Take 1 tablet (325 mg total) by mouth daily with breakfast.   nitroGLYCERIN (NITROSTAT) 0.4 MG SL tablet Place 1 tablet (0.4 mg total) under the tongue every 5 (five) minutes as needed for chest pain.   OXYGEN Inhale 2 L/min into the lungs as directed. On exertion or when  walking   [DISCONTINUED] TUDORZA PRESSAIR 400 MCG/ACT AEPB INHALE 1 PUFF INTO THE LUNGS IN THE MORNING AND AT BEDTIME.   No facility-administered encounter medications on file as of 05/25/2022.    Allergies (verified) Lisinopril   History: Past Medical History:  Diagnosis Date   AVM (arteriovenous malformation)    CAD (coronary artery disease)    a. LHC 5/12:  LAD 20, pLCx 20, pRCA 40, dRCA 40, EF 35%, diff HK  //  b. Myoview 4/16: Overall Impression:  High risk stress nuclear study There is no evidence of ischemia.  There is severe LV dysfunction. LV Ejection Fraction: 30%.  LV Wall Motion:  There is global LV hypokinesis.     CAP (community acquired pneumonia) 09/2013   Chronic combined systolic and diastolic CHF (congestive heart failure) ([place])    a. Echo 4/16:Mild LVH, EF 40-45%, diffuse HK //  b. Echo 8/17: EF 35-40%, diffuse HK, diastolic dysfunction, aortic sclerosis, trivial MR, moderate LAE, normal RVSF, moderate RAE, mild TR, PASP 42 mmHg // c. Echo 4/18: Mild concentric LVH, EF 30-35, normal wall motion, grade 1 diastolic dysfunction, PASP 49   Chronic respiratory failure (HCC)    Cluster headache    "hx; haven't had one in awhile" (01/09/2016)   COPD (chronic obstructive pulmonary disease) (HCC)    [name] 01/09/2016   DM (diabetes mellitus) ([place])    History of CVA (cerebrovascular accident)    Hypertension    IDA (iron deficiency anemia)    Moderate tobacco use disorder    NICM (nonischemic cardiomyopathy) ([place])    Nicotine addiction    Tobacco abuse    Past Surgical History:  Procedure Laterality Date   BIOPSY  11/12/2020   Procedure: BIOPSY;  Surgeon: Irving Copas., MD;  Location: [facility];  Service: Gastroenterology;;   CARDIAC CATHETERIZATION  10/2010   LM normal, LAD with 20% irregularities, LCX with 20%, RCA with 40% prox and 40% distal - EF of 35%   CATARACT EXTRACTION, BILATERAL     COLONOSCOPY W/ BIOPSIES AND POLYPECTOMY     COLONOSCOPY WITH  PROPOFOL N/A 09/06/2018   Procedure: COLONOSCOPY WITH PROPOFOL;  Surgeon: Thornton Park, MD;  Location: [place];  Service: Gastroenterology;  Laterality: N/A;   ENTEROSCOPY N/A 09/28/2018   Procedure: ENTEROSCOPY;  Surgeon: Carol Ada, MD;  Location: [place];  Service: Endoscopy;  Laterality: N/A;   ENTEROSCOPY N/A 10/28/2018   Procedure: ENTEROSCOPY;  Surgeon: Thornton Park, MD;  Location: [place];  Service: Gastroenterology;  Laterality: N/A;   ENTEROSCOPY N/A 10/09/2020   Procedure: ENTEROSCOPY;  Surgeon: Irene Shipper, MD;  Location: [facility] ENDOSCOPY;  Service: Endoscopy;  Laterality: N/A;   ENTEROSCOPY N/A 03/22/2022   Procedure: ENTEROSCOPY;  Surgeon: Yetta Flock, MD;  Location[facility];  Service: Gastroenterology;  Laterality: N/A;   ESOPHAGOGASTRODUODENOSCOPY N/A 11/12/2020   Procedure:  ESOPHAGOGASTRODUODENOSCOPY (EGD);  Surgeon: Irving Copas., MD;  Location: [facility];  Service: Gastroenterology;  Laterality: N/A;   ESOPHAGOGASTRODUODENOSCOPY (EGD) WITH PROPOFOL N/A 09/05/2018   Procedure: ESOPHAGOGASTRODUODENOSCOPY (EGD) WITH PROPOFOL;  Surgeon: Yetta Flock, MD;  Location: [place];  Service: Gastroenterology;  Laterality: N/A;   ESOPHAGOGASTRODUODENOSCOPY (EGD) WITH PROPOFOL N/A 11/19/2020   Procedure: ESOPHAGOGASTRODUODENOSCOPY (EGD) WITH PROPOFOL;  Surgeon: Jerene Bears, MD;  [facility]: [facility];  Service: Gastroenterology;  Laterality: N/A;   EXCISION MASS HEAD     GIVENS CAPSULE STUDY N/A 09/06/2018   Procedure: GIVENS CAPSULE STUDY;  Surgeon: Thornton Park, MD;  Location: [place];  Service: Gastroenterology;  Laterality: N/A;   GIVENS CAPSULE STUDY N/A 09/26/2018   Procedure: GIVENS CAPSULE STUDY;  Surgeon: Jerene Bears, MD;  Location: [place];  Service: Gastroenterology;  Laterality: N/A;   GIVENS CAPSULE STUDY N/A 06/14/2019   Procedure: GIVENS CAPSULE STUDY;  Surgeon: Mauri Pole, MD;  Location: [place]  ENDOSCOPY;  Service: Endoscopy;  Laterality: N/A;   HEMOSTASIS CLIP PLACEMENT  11/12/2020   Procedure: HEMOSTASIS CLIP PLACEMENT;  Surgeon: Irving Copas., MD;  Location: [facility];  Service: Gastroenterology;;   HEMOSTASIS CONTROL  11/12/2020   Procedure: HEMOSTASIS CONTROL;  Surgeon: Irving Copas., MD;  Location: [facility];  Service: Gastroenterology;;   HOT HEMOSTASIS N/A 10/28/2018   Procedure: HOT HEMOSTASIS (ARGON PLASMA COAGULATION/BICAP);  Surgeon: Thornton Park, MD;  Location: [place];  Service: Gastroenterology;  Laterality: N/A;   HOT HEMOSTASIS N/A 11/12/2020   Procedure: HOT HEMOSTASIS (ARGON PLASMA COAGULATION/BICAP);  Surgeon: Irving Copas., MD;  Location: [facility];  Service: Gastroenterology;  Laterality: N/A;   HOT HEMOSTASIS N/A 03/22/2022   Procedure: HOT HEMOSTASIS (ARGON PLASMA COAGULATION/BICAP);  Surgeon: Yetta Flock, MD;  Location: [facility] ENDOSCOPY;  Service: Gastroenterology;  Laterality: N/A;   INCISION AND DRAINAGE PERIRECTAL ABSCESS N/A 06/05/2017   Procedure: IRRIGATION AND DEBRIDEMENT PERIRECTAL ABSCESS;  Surgeon: Ileana Roup, MD;  Location: [place];  Service: General;  Laterality: N/A;   SUBMUCOSAL TATTOO INJECTION  11/12/2020   Procedure: SUBMUCOSAL TATTOO INJECTION;  Surgeon: Irving Copas., MD;  Location: [facility];  Service: Gastroenterology;;   VIDEO BRONCHOSCOPY Bilateral 05/08/2016   Procedure: VIDEO BRONCHOSCOPY WITH FLUORO;  Surgeon: Rigoberto Noel, MD;  Location: [place];  Service: Cardiopulmonary;  Laterality: Bilateral;   Family History  Problem Relation Age of Onset   Heart disease Mother    Diabetes Mother    Colon cancer Mother    Liver cancer Mother    Cancer Father        type unknown   Diabetes Sister        x 2   Diabetes Brother    Social History   Socioeconomic History   Marital status: Single    Spouse name: Not on file   Number of children: 1   Years of  education: Not on file   Highest education level: Not on file  Occupational History   Occupation: retired  Tobacco Use   Smoking status: Every Day    Packs/day: 0.50    Years: 47.00    Total pack years: 23.50    Types: Cigarettes   Smokeless tobacco: Never   Tobacco comments:    4/5 cigs  Vaping Use   Vaping Use: Never used  Substance and Sexual Activity   Alcohol use: Not Currently    Alcohol/week: 0.0 standard drinks of alcohol    Comment: last drink was before xmas  Drug use: No    Types: Cocaine, Marijuana    Comment: "nothing in 20 years"   Sexual activity: Not Currently  Other Topics Concern   Not on file  Social History Narrative   unemployed   Social Determinants of Health   Financial Resource Strain: Low Risk  (05/25/2022)   Overall Financial Resource Strain (CARDIA)    Difficulty of Paying Living Expenses: Not hard at all  Food Insecurity: No Food Insecurity (05/25/2022)   Hunger Vital Sign    Worried About Running Out of Food in the Last Year: Never true    [place] in the Last Year: Never true  Transportation Needs: No Transportation Needs (05/25/2022)   PRAPARE - Hydrologist (Medical): No    Lack of Transportation (Non-Medical): No  Physical Activity: Inactive (05/25/2022)   Exercise Vital Sign    Days of Exercise per Week: 0 days    Minutes of Exercise per Session: 0 min  Stress: No Stress Concern Present (05/25/2022)   [place]    Feeling of Stress : Not at all  Social Connections: Unknown (05/25/2022)   Social Connection and Isolation Panel [NHANES]    Frequency of Communication with Friends and Family: Three times a week    Frequency of Social Gatherings with Friends and Family: Never    Attends Religious Services: Not on Advertising copywriter or Organizations: No    Attends Archivist Meetings: Never    Marital Status:  Divorced    Tobacco Counseling Ready to quit: Not Answered Counseling given: Not Answered Tobacco comments: 4/5 cigs   Clinical Intake:     Pain : No/denies pain     Diabetes: Yes CBG done?: No  How often do you need to have someone help you when you read instructions, pamphlets, or other written materials from your doctor or pharmacy?: (P) 1 - Never  Diabetic?yes          Activities of Daily Living    05/25/2022    2:12 PM 05/21/2022    9:09 AM  In your present state of health, do you have any difficulty performing the following activities:  Hearing? 0 0  Vision? 0 0  Difficulty concentrating or making decisions? 0 0  Walking or climbing stairs? 1 1  Dressing or bathing? 0 0  Doing errands, shopping? 1 1  Preparing Food and eating ? N Y  Using the Toilet? N N  In the past six months, have you accidently leaked urine? N N  Do you have problems with loss of bowel control? N N  Managing your Medications? N N  Managing your Finances? N N  Housekeeping or managing your Housekeeping? N Y    Patient Care Team: Charlott Rakes, MD as PCP - General (Family Medicine) Fay Records, MD as PCP - Cardiology (Cardiology)  Indicate any recent Medical Services you may have received from other than [facility] providers in the past year (date may be approximate).     Assessment:   This is a routine wellness examination for [place].  Hearing/Vision screen No results found.  Dietary issues and exercise activities discussed:     Goals Addressed   None   Depression Screen    04/25/2022    9:44 AM 12/18/2021   10:42 AM 09/20/2021    3:41 PM 01/06/2021    8:58 AM 12/05/2020   11:05 AM  06/14/2020    1:47 PM 03/19/2019   12:14 PM  PHQ 2/9 Scores  PHQ - 2 Score 0 1 0 0 0 0 0  PHQ- 9 Score _0 Fall Risk    05/25/2022    2:24 PM 05/21/2022    9:09 AM 04/25/2022    9:44 AM 12/18/2021   10:36 AM 01/06/2021    9:01 AM  Fall Risk   Falls in the past year? 1 1 0 0  1  Number falls in past yr: 0 0 0 0 1  Injury with Fall? 0 0 0 0 0  Risk for fall due to : Other (Comment)    Other (Comment);Impaired balance/gait  Risk for fall due to: Comment     O2 dropping  Follow up     Education provided    [place]:  Any stairs in or around the home? No  If so, are there any without handrails? No  Home free of loose throw rugs in walkways, pet beds, electrical cords, etc? Yes  Adequate lighting in your home to reduce risk of falls? Yes   ASSISTIVE DEVICES UTILIZED TO PREVENT FALLS:  Life alert? Yes  Use of a cane, walker or w/c? Yes  Grab bars in the bathroom? Yes  Shower chair or bench in shower? Yes  Elevated toilet seat or a handicapped toilet? No   TIMED UP AND GO:  Was the test performed? Yes .  Length of time to ambulate 10 feet: 5 sec.   Gait slow and steady with assistive device  Cognitive Function:    05/25/2022    2:25 PM  MMSE - Mini Mental State Exam  Orientation to time 5  Orientation to Place 5  Registration 3  Attention/ Calculation 5  Recall 0  Language- name 2 objects 2  Language- repeat 1  Language- follow 3 step command 3  Language- read & follow direction 1  Write a sentence 1  Copy design 1  Total score 27        05/25/2022    2:26 PM  6CIT Screen  What Year? 0 points  What month? 0 points  What time? 0 points  Months in reverse 0 points  Repeat phrase 0 points    Immunizations Immunization History  Administered Date(s) Administered   COVID-19, mRNA, vaccine(Comirnaty)12 years and older 05/11/2022   Fluad Quad(high Dose 65+) 03/09/2019, 04/25/2022   Influenza,inj,Quad PF,6+ Mos 03/20/2016, 05/02/2018, 03/24/2020, 03/15/2021   Influenza-Unspecified 03/28/2017   PFIZER Comirnaty(Gray Top)Covid-19 Tri-Sucrose Vaccine 02/02/2021   PFIZER(Purple Top)SARS-COV-2 Vaccination 08/12/2019, 09/10/2019, 03/17/2020   PNEUMOCOCCAL CONJUGATE-20 12/05/2020   Pneumococcal  Polysaccharide-23 01/10/2016, 05/02/2018, 05/18/2019   Respiratory Syncytial Virus Vaccine,Recomb Aduvanted(Arexvy) 04/25/2022   Tdap 03/15/2021   Zoster Recombinat (Shingrix) 05/18/2019, 07/20/2019    TDAP status: Up to date  Flu Vaccine status: Up to date  Pneumococcal vaccine status: Up to date  Covid-19 vaccine status: Completed vaccines  Qualifies for Shingles Vaccine? Yes   Zostavax completed Yes   Shingrix Completed?: Yes  Screening Tests Health Maintenance  Topic Date Due   FOOT EXAM  Never done   COVID-19 Vaccine (6 - 2023-24 season) 07/06/2022   Lung Cancer Screening  07/12/2022   HEMOGLOBIN A1C  09/20/2022   Diabetic kidney evaluation - Urine ACR  12/19/2022   Diabetic kidney evaluation - eGFR measurement  03/25/2023   OPHTHALMOLOGY EXAM  05/03/2023   Medicare Annual Wellness (AWV)  05/26/2023   COLONOSCOPY (Pts 45-46yr Insurance coverage will need to be confirmed)  09/05/2028   DTaP/Tdap/Td (2 - Td or Tdap) 03/16/2031   Pneumonia Vaccine 68 Years old  Completed   INFLUENZA VACCINE  Completed   Hepatitis C Screening  Completed   Zoster Vaccines- Shingrix  Completed   HPV VACCINES  Aged Out    Health Maintenance  Health Maintenance Due  Topic Date Due   FOOT EXAM  Never done    Colorectal cancer screening: Type of screening: Colonoscopy. Completed 09/06/2018. Repeat every 10 years  Lung Cancer Screening: (Low Dose CT Chest recommended if Age 68-80years, 30 pack-year currently smoking OR have quit w/in 15years.) does qualify.   Lung Cancer Screening Referral: completed on 07/12/21   Additional Screening:  Hepatitis C Screening: does qualify; Completed 08/08/16  Vision Screening: Recommended annual ophthalmology exams for early detection of glaucoma and other disorders of the eye. Is the patient up to date with their annual eye exam?  Yes  Who is the provider or what is the name of the office in which the patient attends annual eye exams?  If pt is not  established with a provider, would they like to be referred to a provider to establish care? No .   Dental Screening: Recommended annual dental exams for proper oral hygiene  Community Resource Referral / Chronic Care Management: CRR required this visit?  No   CCM required this visit?  No      Plan:     I have personally reviewed and noted the following in the patient's chart:   Medical and social history Use of alcohol, tobacco or illicit drugs  Current medications and supplements including opioid prescriptions. Patient is not currently taking opioid prescriptions. Functional ability and status Nutritional status Physical activity Advanced directives List of other physicians Hospitalizations, surgeries, and ER visits in previous 12 months Vitals Screenings to include cognitive, depression, and falls Referrals and appointments  In addition, I have reviewed and discussed with patient certain preventive protocols, quality metrics, and best practice recommendations. A written personalized care plan for preventive services as well as general preventive health recommendations were provided to patient.     CLillie [place]  05/25/2022   Nurse Notes:

## 2022-06-05 ENCOUNTER — Other Ambulatory Visit: Payer: Self-pay

## 2022-06-06 ENCOUNTER — Other Ambulatory Visit: Payer: Self-pay

## 2022-06-06 ENCOUNTER — Telehealth: Payer: Self-pay

## 2022-06-06 NOTE — Telephone Encounter (Signed)
MyChart message sent to patient with lab reminder.  

## 2022-06-06 NOTE — Telephone Encounter (Signed)
Patient reviewed and responded to MyChart message. Last read by [name] at 12:17 PM on 06/06/2022.

## 2022-06-06 NOTE — Telephone Encounter (Signed)
-----   Message from Missy Sabins, RN sent at 04/24/2022  2:37 PM EST ----- Regarding: Labs CBC and IBC + Ferritin - orders are in epic

## 2022-06-07 ENCOUNTER — Other Ambulatory Visit (INDEPENDENT_AMBULATORY_CARE_PROVIDER_SITE_OTHER): Payer: Medicare HMO

## 2022-06-07 DIAGNOSIS — D500 Iron deficiency anemia secondary to blood loss (chronic): Secondary | ICD-10-CM | POA: Diagnosis not present

## 2022-06-07 DIAGNOSIS — Z7902 Long term (current) use of antithrombotics/antiplatelets: Secondary | ICD-10-CM

## 2022-06-07 DIAGNOSIS — R140 Abdominal distension (gaseous): Secondary | ICD-10-CM | POA: Diagnosis not present

## 2022-06-07 LAB — CBC WITH DIFFERENTIAL/PLATELET
Basophils Absolute: 0.1 10*3/uL (ref 0.0–0.1)
Basophils Relative: 0.8 % (ref 0.0–3.0)
Eosinophils Absolute: 0.1 10*3/uL (ref 0.0–0.7)
Eosinophils Relative: 1.5 % (ref 0.0–5.0)
HCT: 37.1 % — ABNORMAL LOW (ref 39.0–52.0)
Hemoglobin: 11.7 g/dL — ABNORMAL LOW (ref 13.0–17.0)
Lymphocytes Relative: 26 % (ref 12.0–46.0)
Lymphs Abs: 1.9 10*3/uL (ref 0.7–4.0)
MCHC: 31.6 g/dL (ref 30.0–36.0)
MCV: 89.8 fl (ref 78.0–100.0)
Monocytes Absolute: 0.8 10*3/uL (ref 0.1–1.0)
Monocytes Relative: 10.3 % (ref 3.0–12.0)
Neutro Abs: 4.6 10*3/uL (ref 1.4–7.7)
Neutrophils Relative %: 61.4 % (ref 43.0–77.0)
Platelets: 233 10*3/uL (ref 150.0–400.0)
RBC: 4.13 Mil/uL — ABNORMAL LOW (ref 4.22–5.81)
RDW: 18.5 % — ABNORMAL HIGH (ref 11.5–15.5)
WBC: 7.5 10*3/uL (ref 4.0–10.5)

## 2022-06-08 LAB — IBC + FERRITIN
Ferritin: 10.8 ng/mL — ABNORMAL LOW (ref 22.0–322.0)
Iron: 193 ug/dL — ABNORMAL HIGH (ref 42–165)
Saturation Ratios: 58.9 % — ABNORMAL HIGH (ref 20.0–50.0)
TIBC: 327.6 ug/dL (ref 250.0–450.0)
Transferrin: 234 mg/dL (ref 212.0–360.0)

## 2022-06-12 ENCOUNTER — Other Ambulatory Visit: Payer: Self-pay

## 2022-06-12 ENCOUNTER — Other Ambulatory Visit: Payer: Self-pay | Admitting: Family Medicine

## 2022-06-12 DIAGNOSIS — I110 Hypertensive heart disease with heart failure: Secondary | ICD-10-CM

## 2022-06-12 MED ORDER — FUROSEMIDE 40 MG PO TABS
40.0000 mg | ORAL_TABLET | Freq: Two times a day (BID) | ORAL | 2 refills | Status: DC
  Filled 2022-06-12: qty 60, 30d supply, fill #0
  Filled 2022-09-05: qty 60, 30d supply, fill #1
  Filled 2022-10-08: qty 60, 30d supply, fill #2

## 2022-06-13 ENCOUNTER — Other Ambulatory Visit: Payer: Self-pay

## 2022-06-14 ENCOUNTER — Other Ambulatory Visit: Payer: Self-pay

## 2022-06-14 DIAGNOSIS — D500 Iron deficiency anemia secondary to blood loss (chronic): Secondary | ICD-10-CM

## 2022-06-17 ENCOUNTER — Other Ambulatory Visit: Payer: Self-pay | Admitting: Family Medicine

## 2022-06-17 DIAGNOSIS — K250 Acute gastric ulcer with hemorrhage: Secondary | ICD-10-CM

## 2022-06-18 ENCOUNTER — Other Ambulatory Visit: Payer: Self-pay

## 2022-06-18 MED ORDER — PANTOPRAZOLE SODIUM 40 MG PO TBEC
40.0000 mg | DELAYED_RELEASE_TABLET | Freq: Every day | ORAL | 0 refills | Status: DC
  Filled 2022-06-18: qty 90, 90d supply, fill #0

## 2022-06-18 NOTE — Telephone Encounter (Signed)
Requested Prescriptions  Pending Prescriptions Disp Refills   pantoprazole (PROTONIX) 40 MG tablet 90 tablet 0    Sig: Take 1 tablet (40 mg total) by mouth daily.     Gastroenterology: Proton Pump Inhibitors Passed - 06/17/2022 12:10 PM      Passed - Valid encounter within last 12 months    Recent Outpatient Visits           1 month ago COPD GOLD III/still smoking    [place], [place], MD   6 months ago Type 2 diabetes mellitus with other specified complication, without long-term current use of insulin ([place])   [place], Charlane Ferretti, MD   9 months ago Chronic pain of both knees   [facility], [place], [place]   1 year ago COPD GOLD III/still smoking    [place], [place], MD   1 year ago Epistaxis   [place], [place], MD       Future Appointments             In 2 weeks Melvyn Novas, Christena Deem, MD [facility]   In 1 month Charlott Rakes, MD [place]

## 2022-06-20 ENCOUNTER — Other Ambulatory Visit: Payer: Self-pay

## 2022-06-21 ENCOUNTER — Other Ambulatory Visit: Payer: Self-pay

## 2022-06-22 ENCOUNTER — Other Ambulatory Visit: Payer: Self-pay

## 2022-07-03 ENCOUNTER — Other Ambulatory Visit: Payer: Self-pay

## 2022-07-03 ENCOUNTER — Encounter: Payer: Self-pay | Admitting: Internal Medicine

## 2022-07-04 ENCOUNTER — Encounter: Payer: Self-pay | Admitting: Internal Medicine

## 2022-07-04 ENCOUNTER — Other Ambulatory Visit: Payer: Self-pay

## 2022-07-04 ENCOUNTER — Ambulatory Visit (INDEPENDENT_AMBULATORY_CARE_PROVIDER_SITE_OTHER): Payer: Medicare HMO | Admitting: Internal Medicine

## 2022-07-04 VITALS — BP 102/60 | HR 113 | Temp 98.1°F | Ht 75.0 in | Wt 212.8 lb

## 2022-07-04 DIAGNOSIS — J449 Chronic obstructive pulmonary disease, unspecified: Secondary | ICD-10-CM

## 2022-07-04 DIAGNOSIS — F17210 Nicotine dependence, cigarettes, uncomplicated: Secondary | ICD-10-CM

## 2022-07-04 DIAGNOSIS — J9612 Chronic respiratory failure with hypercapnia: Secondary | ICD-10-CM

## 2022-07-04 DIAGNOSIS — J9611 Chronic respiratory failure with hypoxia: Secondary | ICD-10-CM

## 2022-07-04 NOTE — Assessment & Plan Note (Signed)
Active smoker 02/29/2016  After extensive coaching HFA effectiveness =    75% > continue symbicort  - Spirometry 05/28/2016  FEV1 1.48 (40%)  Ratio 46  p am symbicort 160 2bid  - 06/26/2016  After extensive coaching HFA effectiveness =    90% > bevespi 2 bid and change coreg to bisoprolol 5 mg daily > not done as of 07/24/2016 > at ov 10/22/2016 off coreg and all inhalers and much better sob but still some cough on entresto maint - Spirometry 10/22/2016  FEV1 1.24 (35%)  Ratio 48 - 12/31/2018    tudorza rx one puff bid  - 01/28/2019  After extensive coaching inhaler device,  effectiveness =    90% with tudorza  03/09/2019 Reports improvement with [name] but not covered, refer to [facility] May need PA processed- he has been on bevespi and spiriva in the past  - 06/30/2019  After extensive coaching inhaler device,  effectiveness =    90% with elipta try anoro one each am  PFT's  04/04/2020  FEV1 1.25 (35 % ) ratio 0.41  p 6 % improvement from saba p tudorza prior to study with DLCO  8.26 (27%) corrects to 1.38 (34%)  for alv volume and FV curve classic concave pattern  - 04/04/2020   breztri 2bid  - 01/01/2022  After extensive coaching inhaler device,  effectiveness =   80%   Group D (now reclassified as E) in terms of symptom/risk and laba/lama/ICS  therefore appropriate rx at this point >>>  breztri and work on more approp saba:  Re SABA :  I spent extra time with pt today reviewing appropriate use of albuterol for prn use on exertion with the following points: 1) saba is for relief of sob that does not improve by walking a slower pace or resting but rather if the pt does not improve after trying this first. 2) If the pt is convinced, as many are, that saba helps recover from activity faster then it's easy to tell if this is the case by re-challenging : ie stop, take the inhaler, then p 5 minutes try the exact same activity (intensity of workload) that just caused the symptoms and see if they are substantially  diminished or not after saba 3) if there is an activity that reproducibly causes the symptoms, try the saba 15 min before the activity on alternate days   If in fact the saba really does help, then fine to continue to use it prn but advised may need to look closer at the maintenance regimen(for now breztri)  being used to achieve better control of airways disease with exertion.

## 2022-07-04 NOTE — Assessment & Plan Note (Signed)
See copd - 10/22/2016  Walked RA x 3 laps @ 185 ft each stopped due to  End of study moderate pace, no desat  - HCO03  11/11/18  = 41  - 12/31/2018   Walked 2lpm   2 laps @  approx 231ft each @ avg pace  stopped due to  Sob on 2lpm but no desats   - 01/28/2019   Walked RA x one lap =  approx 250 ft - stopped due to  Sob with sats ok on 3lpm POC > referred for POC  - HCO3  06/15/19 = 38  -  04/04/2020   Walked 3lpm  pulsed  3laps @ approx 294ft each @avg  pace  stopped due desats p second lap to 87% corrected on 4lpm pulsed   With sats 98% at end  - HCO3  02/13/21  = 41  - 04/03/2021   Walked on RA  desat to 84% p 250 ft   Then 250 ft on 2lpm cont avg pace sats 92% at lowest - 06/27/2021 Patient Saturations on Room Air at Rest =95%  while Ambulating = 88% and  on 3 Liters of oxygen while Ambulating = 95%  - HC03  7/210/23  = 26  (suggests hypercarbic component resolved now)   Again advised: Make sure you check your oxygen saturation  AT  your highest level of activity (not after you stop)   to be sure it stays over 90% and adjust  02 flow upward to maintain this level if needed but remember to turn it back to previous settings when you stop (to conserve your supply).   Referred back to adapt for best fit eval for portable 02

## 2022-07-04 NOTE — Patient Instructions (Addendum)
Dr Ninfa Linden is your orthopedic surgeon  Plan A = Automatic = Always= Breztri Take 2 puffs first thing in am and then another 2 puffs about 12 hours later.    Plan B = Backup (to supplement plan A, not to replace it) Only use your albuterol inhaler as a rescue medication to be used if you can't catch your breath by resting or doing a relaxed purse lip breathing pattern.  - The less you use it, the better it will work when you need it. - Ok to use the inhaler up to 2 puffs  every 4 hours if you must but call for appointment if use goes up over your usual need - Don't leave home without it !!  (think of it like starter fluid for an engine)  Plan C = Crisis (instead of Plan B but only if Plan B stops working) - only use your albuterol nebulizer if you first try Plan B and it fails to help > ok to use the nebulizer up to every 4 hours but if start needing it regularly call for immediate appointment   Make sure you check your oxygen saturation  at your highest level of activity  to be sure it stays over 90% and adjust  02 flow upward to maintain this level if needed but remember to turn it back to previous settings when you stop (to conserve your supply).  We will refer you again for best fit for portable oxygen for [employer] - please write the name and number of whoever you talk to there if you are not satisfied      Please schedule a follow up visit in 6  months but call sooner if needed

## 2022-07-04 NOTE — Progress Notes (Signed)
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 male with medical history significant for CABG, hypertension, prior history of stroke without residual, systolic heart failure, current tobacco abuse, chronic headaches, presenting to the emergency department with generalized weakness, and increased shortness of breath, along with  white sputum production. He also reports dyspnea on exertion. In addition, he reports epigastric abdominal pain, which may radiate to the lower portion of his sternum versus chest pain . He denies any lower extremity edema.  Admits to salt rich food ingestion .The symptoms are similar to those of one week ago, at which time, workup was suspicious for acute exacerbation of systolic heart failure. She was started on Cozaar, metoprolol and Lasix 20 mg daily at the time, and was recommended to see cardiology, which he failed to do so. His cardiologist is Dr. Dorris Carnes, last seen in December 2016.the patient continues to smoke 1 pack a day of cigarettes   Discharge Diagnoses:  Acute respiratory failure secondary to CHF exacerbation -O2 sats upon admission 89% -CXR unremarkable for infection -Responded well to IV lasix-->po lasix -Weaned off of O2-SpO2 95% on room air   Acute combined systolic/diastolic heart failure -BNP 2056.8 -Echocardiogram 09/20/2014 showed EF 40-45% -Transition to by mouth Lasix 20 mg daily -Echocardiogram 01/10/2016: EF 98-33%, diastolic dysfunction --AST 147, ALT 110 upon admission -Currently trending downward -hepatitis panel unremarkable -Patient denies drug/alcohol  use -switch metoprolol-->coreg -continue losartan   Essential Hypertension -Has not been taking home medications for over a year due to lack of insurance and monetary funds -Restarted on losartan,  Lasix --switch metoprolol-->coreg 6.25 mg bid   CAD -Currently no chest pain -Continue coreg, losartan, ASA 81 mg daily -LDL 80   Tobacco Abuse -Smoking cessation discussed -Continue nicotine patch   Moderate malnutrition -Nutrition consult   History of Present Illness  02/29/2016 1st [facility] office visit/ Matelyn Antonelli  symb 160 2bid/ bid saba (not prn)  Chief Complaint  Patient presents with   Pulmonary Consult    Pt referred by [name], PA, for cough, hemoptysis, and emphysema. Pt c/o hemoptysis x 3 weeks, pt states this occurs daily but has been improving. Pt c/o increase in SOB with activity and rest.   acute  onset epistaxis/ hemoptyis end of Auguest > ER eval 02/20/16 dx RLL pna by CTa chest > rx levaquin/ prednisone and minimal hemoptysis now  Doe = MMRC2 = can't walk a nl pace on a flat grade s sob but does fine slow and flat eg [employer] shopping rec Plan A = Automatic =  Symbicort 160 Take 2 puffs first thing in am and then another 2 puffs about 12 hours later.  Work on inhaler technique: Plan B = Backup Only use your albuterol as a rescue medication\ Plan C = Crisis - only use your albuterol nebulizer if you first try Plan B and it fails to help > ok to use the nebulizer up to every 4 hours but if start needing it regularly call for immediate appointment  The key is to stop smoking completely  before smoking completely stops you!  Please schedule a follow up office visit in 2 weeks, sooner if needed with cxr on return > did not return   Admit date: 05/04/2016 Discharge date: 05/09/2016     DISCHARGE DIAGNOSES:  Principal Problem:   Hemoptysis Active Problems:   Community acquired pneumonia of right lower lobe of lung (HCC) - - FOB RLL tbbx ATYPICAL PNEUMOCYTES  PROLIFERATION/ LLL BAL 05/08/16    Hypertension   COPD with acute exacerbation (HCC)   Nonischemic dilated cardiomyopathy (HCC)   Cigarette smoker   Chronic systolic CHF (congestive heart failure) (HCC)   06/03/16 to ER> only better while on neb    06/26/2016  f/u ov/Carel Schnee re:  GOLD III copd / maint symb 160 / saba  Chief Complaint  Patient presents with   Follow-up    pt had CT today. pt states breathing has worsen since last OV. pt c/o increased sob with exertion, prod cough with white mucus, wheezing & chest tightness,  breathing was worse w/in 2 days of last ov / then ran out of lasix one week ago and swelling started to get worse Also worse orthopnea ? Related to leg swelling in terms of timing but pt not sure  Doe now = MMRC3 = can't walk 100 yards even at a slow pace at a flat grade s stopping due to sob  rec Bisoprolol 5 mg one daily in place of the twice daily corevidol and resume your lasix and potassium  Plan A = Automatic = stop symbicort and start bevespi Take 2 puffs first thing in am and then another 2 puffs about 12 hours later.  Plan B = Backup Only use your albuterol as a rescue medication The key is to stop smoking completely before smoking completely stops you!  Please schedule a follow up office visit in 4 weeks, sooner if needed bring all meds     01/01/2022  f/u ov/Nicoya Friel re: GOLD 3 copd /0 2 dep   maint on breztri   Chief Complaint  Patient presents with   Follow-up    Breathing is unchanged. He has noticed occ wheezing. He has some prod cough with white sputum. He is using his albuterol inhaler about 2 x per day.    Dyspnea:  PT working with him walking up to 2 m at a time in house and feels this is helping Cough: min in am / white  Sleeping: flat bed one pillow  SABA use: as above  02: prn 2lpm  Lung cancer screeing yearly in Feb due  Rec Plan A = Automatic = Always= Breztri Take 2 puffs first thing in am and then another 2 puffs about 12 hours later.   Plan B = Backup (to supplement plan A, not to replace it) Only use your albuterol inhaler as a rescue medication  Plan C = Crisis (instead of Plan B but only if Plan B stops working) - only use your albuterol nebulizer if you first try Plan B  Make sure you check your oxygen saturation  at your highest level of activity  to be sure it stays over 90%    07/04/2022  f/u ov/Daly Whipkey re: GOLD 3/02 dep  maint on breztri   Chief Complaint  Patient presents with   Follow-up    Some cough and still SOB with exertion.   Dyspnea:  room to room at home,  now limited by L knee pain  Cough: min am mucoid  Sleeping: flat bed one  pillow SABA use: not using pre ex 02: 2lpm 25/7 does not check with ex  Covid status:   vax x 4 or 5 / never infected  Lung cancer screening :  in program     No obvious day to day or daytime variability or assoc excess/ purulent sputum or mucus plugs or hemoptysis or cp or chest tightness, subjective wheeze or overt sinus or hb symptoms.   Sleeping  without nocturnal  or early am exacerbation  of respiratory  c/o's or need for noct saba. Also denies any obvious fluctuation of symptoms with weather or environmental changes or other aggravating or alleviating factors except as outlined above   No unusual exposure hx or h/o childhood pna/ asthma or knowledge of premature birth.  Current Allergies, Complete Past Medical History, Past Surgical History, Family History, and Social History were reviewed in [employer] record.  ROS  The following are not active complaints unless bolded Hoarseness, sore throat, dysphagia, dental problems, itching, sneezing,  nasal congestion or discharge of excess mucus or purulent secretions, ear ache,   fever, chills, sweats, unintended wt loss or wt gain, classically pleuritic or exertional cp,  orthopnea pnd or arm/hand swelling  or leg swelling, presyncope, palpitations, abdominal pain, anorexia, nausea, vomiting, diarrhea  or  change in bowel habits or change in bladder habits, change in stools or change in urine, dysuria, hematuria,  rash, arthralgias, visual complaints, headache, numbness, weakness or ataxia or problems with walking or coordination,  change in mood or  memory.        Current Meds  Medication Sig   Accu-Chek Softclix Lancets lancets Use as instructed daily before meals   albuterol (PROVENTIL) (2.5 MG/3ML) 0.083% nebulizer solution Take 3 mLs by nebulization every 6 (six) hours as needed for wheezing or shortness of breath.   albuterol (VENTOLIN HFA) 108 (90 Base) MCG/ACT inhaler INHALE 2 PUFFS INTO THE LUNGS EVERY 6 (SIX) HOURS AS NEEDED FOR WHEEZING OR SHORTNESS OF BREATH. (Patient taking differently: Inhale 2 puffs into the lungs 3 (three) times daily as needed for wheezing or shortness of breath.)   Aspirin Effervescent (ALKA-SELTZER ORIGINAL PO) Take 1 packet by mouth 2 (two) times daily as needed (bloat).   atorvastatin (LIPITOR) 80 MG tablet Take 1 tablet (80 mg total) by mouth daily.   Blood Glucose Monitoring Suppl (ACCU-CHEK GUIDE) w/Device KIT Use as directed three times daily   Budeson-Glycopyrrol-Formoterol (BREZTRI AEROSPHERE) 160-9-4.8 MCG/ACT AERO Take 2 puffs first thing in morning and then another 2 puffs about 12 hours later. (Patient taking differently: Inhale 2 puffs into the lungs in the morning and at bedtime.)   carvedilol (COREG) 3.125 MG tablet Take 1 tablet (3.125 mg total) by mouth 2 (two) times daily with a meal.   ezetimibe (ZETIA) 10 MG tablet Take 1 tablet (10 mg total) by mouth daily.   ferrous sulfate 325 (65 FE) MG tablet Take 1 tablet (325 mg total) by mouth daily with breakfast.   folic acid (FOLVITE) 1 MG tablet Take 1 tablet (1 mg total) by mouth daily.   furosemide (LASIX) 40 MG tablet Take 1 tablet (40 mg total) by mouth 2 (two) times daily.   gabapentin (NEURONTIN) 300 MG capsule TAKE 1 CAPSULE (300 MG TOTAL) BY MOUTH AT BEDTIME. (Patient taking differently: Take  300 mg by mouth at bedtime.)   glucose blood (ACCU-CHEK GUIDE) test strip Use as instructed daily   hydrALAZINE (APRESOLINE) 50 MG tablet Take 1 tablet (50 mg total) by  mouth every 8 (eight) hours.   isosorbide mononitrate (IMDUR) 30 MG 24 hr tablet Take 1 tablet (30 mg total) by mouth daily.   OXYGEN Inhale 2 L/min into the lungs as directed. On exertion or when walking   pantoprazole (PROTONIX) 40 MG tablet Take 1 tablet (40 mg total) by mouth daily.   rivaroxaban (XARELTO) 20 MG TABS tablet Take 1 tablet (20 mg total) by mouth daily with supper.                            Objective:   Physical Exam   Wts  07/04/2022     212  01/01/2022      223 06/27/2021      226  04/03/2021    224  10/04/2020      217 04/04/2020    212 06/30/2019      189  01/28/2019      191 12/31/2018      189  10/22/2016      190  07/24/2016        209  06/26/2016        213  05/28/2016      194   02/29/16 177 lb 12.8 oz (80.6 kg)  02/24/16 182 lb 12.8 oz (82.9 kg)  02/20/16 175 lb (79.4 kg)    Vital signs reviewed  07/04/2022  - Note at rest 02 sats  95% on 2lpm pulsed    General appearance:    chornically ill amb bm > stated age    16 :  Oropharynx  clear      NECK :  without JVD/Nodes/TM/ nl carotid upstrokes bilaterally   LUNGS: no acc muscle use,  Mod barrel  contour chest wall with bilateral  Distant bs s audible wheeze and  without cough on insp or exp maneuvers and mod  Hyperresonant  to  percussion bilaterally     CV:  RRR  no s3 or murmur or increase in P2, and no edema   ABD:  soft and nontender with pos mid insp Hoover's  in the supine position. No bruits or organomegaly appreciated, bowel sounds nl  MS:   Ext warm without deformities or   obvious joint restrictions , calf tenderness, cyanosis or clubbing  SKIN: warm and dry without lesions    NEURO:  alert, approp, nl sensorium with  no motor or cerebellar deficits apparent.                      Assessment & Plan:

## 2022-07-04 NOTE — Assessment & Plan Note (Addendum)
Counseled re importance of smoking cessation but did not meet time criteria for separate billing    F/u 6 m, sooner prn   Each maintenance medication was reviewed in detail including emphasizing most importantly the difference between maintenance and prns and under what circumstances the prns are to be triggered using an action plan format where appropriate.  Total time for H and P, chart review, counseling, reviewing hfa/neb/02  device(s) and generating customized AVS unique to this office visit / same day charting = 30 min

## 2022-07-09 ENCOUNTER — Ambulatory Visit: Payer: Medicare HMO | Admitting: Physician Assistant

## 2022-07-11 ENCOUNTER — Other Ambulatory Visit: Payer: Self-pay

## 2022-07-12 ENCOUNTER — Ambulatory Visit (INDEPENDENT_AMBULATORY_CARE_PROVIDER_SITE_OTHER): Payer: Medicare HMO | Admitting: Physician Assistant

## 2022-07-12 ENCOUNTER — Encounter: Payer: Self-pay | Admitting: Physician Assistant

## 2022-07-12 ENCOUNTER — Ambulatory Visit
Admission: RE | Admit: 2022-07-12 | Discharge: 2022-07-12 | Disposition: A | Discharge status: Home / Self Care | Payer: Medicare HMO | Source: Ambulatory Visit

## 2022-07-12 ENCOUNTER — Other Ambulatory Visit: Payer: Self-pay

## 2022-07-12 ENCOUNTER — Ambulatory Visit (INDEPENDENT_AMBULATORY_CARE_PROVIDER_SITE_OTHER): Payer: Medicare HMO

## 2022-07-12 DIAGNOSIS — M25562 Pain in left knee: Secondary | ICD-10-CM | POA: Diagnosis not present

## 2022-07-12 DIAGNOSIS — R202 Paresthesia of skin: Secondary | ICD-10-CM

## 2022-07-12 DIAGNOSIS — F17210 Nicotine dependence, cigarettes, uncomplicated: Secondary | ICD-10-CM | POA: Diagnosis not present

## 2022-07-12 DIAGNOSIS — M5410 Radiculopathy, site unspecified: Secondary | ICD-10-CM

## 2022-07-12 DIAGNOSIS — G8929 Other chronic pain: Secondary | ICD-10-CM

## 2022-07-12 DIAGNOSIS — Z87891 Personal history of nicotine dependence: Secondary | ICD-10-CM

## 2022-07-12 MED ORDER — METHYLPREDNISOLONE ACETATE 40 MG/ML IJ SUSP
40.0000 mg | INTRAMUSCULAR | Status: AC | PRN
  Administered 2022-07-12: 40 mg via INTRA_ARTICULAR

## 2022-07-12 MED ORDER — TIZANIDINE HCL 2 MG PO TABS
2.0000 mg | ORAL_TABLET | Freq: Every day | ORAL | 0 refills | Status: DC
  Filled 2022-07-12: qty 30, 30d supply, fill #0

## 2022-07-12 MED ORDER — LIDOCAINE HCL 1 % IJ SOLN
3.0000 mL | INTRAMUSCULAR | Status: AC | PRN
  Administered 2022-07-12: 3 mL

## 2022-07-12 NOTE — Progress Notes (Addendum)
Office Visit Note   Patient: Jared [name]           Date of Birth: [birth year]           MRN: [id number] Visit Date: 07/12/2022              Requested by: [name], MD [address] [place],  [place] [address] PCP: [name], MD   Assessment & Plan: Visit Diagnoses:  1. Chronic pain of left knee   2. Radiculopathy of leg     Plan: He is given handouts on back exercises.  Did offer him formal physical therapy he defers.  He will continue his Neurontin.  We placed him on Zanaflex to take at night due to the back pain and numbness tingling.  Pain persist or becomes worse we will work this up further in the future.  He will work on quad strengthening exercises for his knee.   Follow-Up Instructions: Return if symptoms worsen or fail to improve.   Orders:  Orders Placed This Encounter  Procedures   Large Joint Inj: L knee   XR Knee 1-2 Views Left   XR Lumbar Spine 2-3 Views   Meds ordered this encounter  Medications   tiZANidine (ZANAFLEX) 2 MG tablet    Sig: Take 1 tablet (2 mg total) by mouth at bedtime.    Dispense:  30 tablet    Refill:  0    per Autumn ok to change to tabs 07/12/22 Ap      Procedures: Large Joint Inj: L knee on 07/12/2022 5:24 PM Indications: pain Details: 22 G 1.5 in needle, anterolateral approach  Arthrogram: No  Medications: 3 mL lidocaine 1 %; 40 mg methylPREDNISolone acetate 40 MG/ML Outcome: tolerated well, no immediate complications Procedure, treatment alternatives, risks and benefits explained, specific risks discussed. Consent was given by the patient. Immediately prior to procedure a time out was called to verify the correct patient, procedure, equipment, support staff and site/side marked as required. Patient was prepped and draped in the usual sterile fashion.       Clinical Data: No additional findings.   Subjective: Chief Complaint  Patient presents with   Lower Back - Pain   Left Knee - Pain     HPI Jared [name] is a 67 year old male well-known to Dr. Raye Sorrow service.  He was last seen 10/02/2021 by Dr. Magnus Ivan for bilateral knee arthritis.  He underwent left knee injection at that time.  States that the tenderness and pain when bending the left knee now.  He states is just recently started.  He is also having some pain that starts in his back goes down past his knee into his foot with some tingling.  Symptoms started a month ago no known injury.  He denies any fevers chills, bowel or bladder dysfunction or changes, waking pain, weight loss or saddle anesthesia like symptoms.  Has pain down the leg when walking.  He has had no known treatment.  He is on Neurontin not sure exactly why stating he was placed on that while he was in the hospital.  Takes this only at night.  He also takes Tylenol.  They seem to help some.  He is on chronic oxygen.  He continues to smoke. Reports good control of his diabetes.  Review of Systems See HPI  Objective: Vital Signs: There were no vitals taken for this visit.  Physical Exam Constitutional:      Appearance: He is not ill-appearing or  diaphoretic.  Neurological:     Mental Status: He is alert and oriented to person, place, and time.  Psychiatric:        Mood and Affect: Mood normal.     Ortho Exam Lumbar spine: Lacks full extension lumbar spine by about 3 to 4 inches.  He has limited extension lumbar spine with.  No pain with flexion and extension.  Nontender over the lower lumbar spinal column or paraspinous region bilaterally.  Negative straight leg raise bilaterally.  Tight hamstrings" bilaterally.  5 out of 5 strength throughout the lower extremities against resistance. Bilateral knees: Quad atrophy bilaterally.  Good range of motion of the right knee.  Lacks last few degrees in full extension of the left knee.  No instability valgus varus stressing of either knee.  No abnormal warmth or erythema or effusion.   Specialty Comments:  No  specialty comments available.  Imaging: XR Knee 1-2 Views Left  Result Date: 07/12/2022 Left knee 2 views: No acute fractures.  No acute findings knee is well located.  Moderate narrowing medial joint line.  Moderate patellofemoral changes.  Lateral compartment well-preserved.  XR Lumbar Spine 2-3 Views  Result Date: 07/12/2022 Lumbar spine 2 views: No acute fractures.  Loss of lordotic curvature.  Significant disc space degenerative changes at L4-5 S1 and moderate changes at L1-L2.  No spinal listhesis.  No acute fractures.  Arthrosclerosis aorta noted.    PMFS History: Patient Active Problem List   Diagnosis Date Noted   Weakness 03/21/2022   History of GI bleed    Gastric ulcer due to Helicobacter pylori and nonsteroidal anti-inflammatory drug (NSAID)    Epigastric pain    Syncope 06/11/2019   Chronic right maxillary sinusitis 01/29/2019   Chronic respiratory failure with hypoxia and hypercapnia (HCC) 01/01/2019   Upper GI bleed 10/27/2018   Anticoagulated 09/27/2018   AVM (arteriovenous malformation) of small bowel, acquired    Iron deficiency anemia    Symptomatic anemia 09/04/2018   Noncompliance 06/14/2017   Elevated LFTs 08/13/2016   Chronic respiratory failure with hypoxia (HCC) 07/24/2016   Atrial flutter (HCC)    Hepatic congestion 07/02/2016   COPD with acute exacerbation (HCC) 06/03/2016   Type 2 diabetes mellitus (HCC) 05/14/2016   COPD GOLD III/still smoking  02/29/2016   Cigarette smoker 01/09/2016   CAD (coronary artery disease) 10/07/2013   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 LHC 5/12:  LAD 20, pLCx 20, pRCA 40, dRCA 40, EF 35%, diff HK  //  b. Myoview 4/16: Overall Impression:  High risk stress nuclear study There is no evidence of ischemia.  There is severe  LV dysfunction. LV Ejection Fraction: 30%.  LV Wall Motion:  There is global LV hypokinesis.     CAP (community acquired pneumonia) 09/2013   Chronic combined systolic and diastolic CHF (congestive heart failure) (HCC)    a. Echo 4/16:Mild LVH, EF 40-45%, diffuse HK //  b. Echo 8/17: EF 35-40%, diffuse HK, diastolic dysfunction, aortic sclerosis, trivial MR, moderate LAE, normal RVSF, moderate RAE, mild TR, PASP 42 mmHg // c. Echo 4/18: Mild concentric LVH, EF 30-35, normal wall motion, grade 1 diastolic dysfunction, PASP 49   Chronic respiratory failure (HCC)    Cluster headache    "hx; haven't had one in awhile" (01/09/2016)  COPD (chronic obstructive pulmonary disease) (HCC)    [name] 01/09/2016   DM (diabetes mellitus) (HCC)    History of CVA (cerebrovascular accident)    Hypertension    IDA (iron deficiency anemia)    Moderate tobacco use disorder    NICM (nonischemic cardiomyopathy) (HCC)    Nicotine addiction    Tobacco abuse    Type 2 diabetes mellitus (HCC) 05/14/2016    Family History  Problem Relation Age of Onset   Heart disease Mother    Diabetes Mother    Colon cancer Mother    Liver cancer Mother    Cancer Father        type unknown   Diabetes Sister        x 2   Diabetes Brother     Past Surgical History:  Procedure Laterality Date   BIOPSY  11/12/2020   Procedure: BIOPSY;  Surgeon: Lemar Lofty., MD;  Location: [facility] ENDOSCOPY;  Service: Gastroenterology;;   CARDIAC CATHETERIZATION  10/2010   LM normal, LAD with 20% irregularities, LCX with 20%, RCA with 40% prox and 40% distal - EF of 35%   CATARACT EXTRACTION, BILATERAL     COLONOSCOPY W/ BIOPSIES AND POLYPECTOMY     COLONOSCOPY WITH PROPOFOL N/A 09/06/2018   Procedure: COLONOSCOPY WITH PROPOFOL;  Surgeon: Tressia Danas, MD;  Location: [facility] ENDOSCOPY;  Service: Gastroenterology;  Laterality: N/A;   ENTEROSCOPY N/A 09/28/2018   Procedure: ENTEROSCOPY;  Surgeon: Jeani Hawking, MD;  Location: [facility] ENDOSCOPY;   Service: Endoscopy;  Laterality: N/A;   ENTEROSCOPY N/A 10/28/2018   Procedure: ENTEROSCOPY;  Surgeon: Tressia Danas, MD;  Location: [facility] ENDOSCOPY;  Service: Gastroenterology;  Laterality: N/A;   ENTEROSCOPY N/A 10/09/2020   Procedure: ENTEROSCOPY;  Surgeon: Hilarie Fredrickson, MD;  Location: [facility] ENDOSCOPY;  Service: Endoscopy;  Laterality: N/A;   ENTEROSCOPY N/A 03/22/2022   Procedure: ENTEROSCOPY;  Surgeon: Benancio Deeds, MD;  Location: [facility] ENDOSCOPY;  Service: Gastroenterology;  Laterality: N/A;   ESOPHAGOGASTRODUODENOSCOPY N/A 11/12/2020   Procedure: ESOPHAGOGASTRODUODENOSCOPY (EGD);  Surgeon: Lemar Lofty., MD;  Location: [facility] ENDOSCOPY;  Service: Gastroenterology;  Laterality: N/A;   ESOPHAGOGASTRODUODENOSCOPY (EGD) WITH PROPOFOL N/A 09/05/2018   Procedure: ESOPHAGOGASTRODUODENOSCOPY (EGD) WITH PROPOFOL;  Surgeon: Benancio Deeds, MD;  Location: [facility] ENDOSCOPY;  Service: Gastroenterology;  Laterality: N/A;   ESOPHAGOGASTRODUODENOSCOPY (EGD) WITH PROPOFOL N/A 11/19/2020   Procedure: ESOPHAGOGASTRODUODENOSCOPY (EGD) WITH PROPOFOL;  Surgeon: Beverley Fiedler, MD;  Location: [facility] ENDOSCOPY;  Service: Gastroenterology;  Laterality: N/A;   EXCISION MASS HEAD     GIVENS CAPSULE STUDY N/A 09/06/2018   Procedure: GIVENS CAPSULE STUDY;  Surgeon: Tressia Danas, MD;  Location: [facility] ENDOSCOPY;  Service: Gastroenterology;  Laterality: N/A;   GIVENS CAPSULE STUDY N/A 09/26/2018   Procedure: GIVENS CAPSULE STUDY;  Surgeon: Beverley Fiedler, MD;  Location: [facility] ENDOSCOPY;  Service: Gastroenterology;  Laterality: N/A;   GIVENS CAPSULE STUDY N/A 06/14/2019   Procedure: GIVENS CAPSULE STUDY;  Surgeon: Napoleon Form, MD;  Location: [facility] ENDOSCOPY;  Service: Endoscopy;  Laterality: N/A;   HEMOSTASIS CLIP PLACEMENT  11/12/2020   Procedure: HEMOSTASIS CLIP PLACEMENT;  Surgeon: Lemar Lofty., MD;  Location: [facility] ENDOSCOPY;  Service: Gastroenterology;;   HEMOSTASIS CONTROL  11/12/2020   Procedure: HEMOSTASIS  CONTROL;  Surgeon: Lemar Lofty., MD;  Location: [facility] ENDOSCOPY;  Service: Gastroenterology;;   HOT HEMOSTASIS N/A 10/28/2018   Procedure: HOT HEMOSTASIS (ARGON PLASMA COAGULATION/BICAP);  Surgeon: Tressia Danas, MD;  Location: [facility] ENDOSCOPY;  Service: Gastroenterology;  Laterality: N/A;  HOT HEMOSTASIS N/A 11/12/2020   Procedure: HOT HEMOSTASIS (ARGON PLASMA COAGULATION/BICAP);  Surgeon: Lemar Lofty., MD;  Location: [facility] ENDOSCOPY;  Service: Gastroenterology;  Laterality: N/A;   HOT HEMOSTASIS N/A 03/22/2022   Procedure: HOT HEMOSTASIS (ARGON PLASMA COAGULATION/BICAP);  Surgeon: Benancio Deeds, MD;  Location: [facility] ENDOSCOPY;  Service: Gastroenterology;  Laterality: N/A;   INCISION AND DRAINAGE PERIRECTAL ABSCESS N/A 06/05/2017   Procedure: IRRIGATION AND DEBRIDEMENT PERIRECTAL ABSCESS;  Surgeon: Andria Meuse, MD;  Location: [facility] OR;  Service: General;  Laterality: N/A;   SUBMUCOSAL TATTOO INJECTION  11/12/2020   Procedure: SUBMUCOSAL TATTOO INJECTION;  Surgeon: Lemar Lofty., MD;  Location: [facility] ENDOSCOPY;  Service: Gastroenterology;;   VIDEO BRONCHOSCOPY Bilateral 05/08/2016   Procedure: VIDEO BRONCHOSCOPY WITH FLUORO;  Surgeon: Oretha Milch, MD;  Location: [facility] ENDOSCOPY;  Service: Cardiopulmonary;  Laterality: Bilateral;   Social History   Occupational History   Occupation: retired  Tobacco Use   Smoking status: Every Day    Packs/day: 0.50    Years: 47.00    Total pack years: 23.50    Types: Cigarettes   Smokeless tobacco: Never   Tobacco comments:    4/5 cigs  Vaping Use   Vaping Use: Never used  Substance and Sexual Activity   Alcohol use: Not Currently    Alcohol/week: 0.0 standard drinks of alcohol    Comment: last drink was before xmas   Drug use: No    Types: Cocaine, Marijuana    Comment: "nothing in 20 years"   Sexual activity: Not Currently

## 2022-07-13 ENCOUNTER — Other Ambulatory Visit: Payer: Self-pay

## 2022-07-13 ENCOUNTER — Telehealth: Payer: Self-pay | Admitting: *Deleted

## 2022-07-13 DIAGNOSIS — R911 Solitary pulmonary nodule: Secondary | ICD-10-CM

## 2022-07-13 DIAGNOSIS — Z87891 Personal history of nicotine dependence: Secondary | ICD-10-CM

## 2022-07-13 NOTE — Telephone Encounter (Signed)
Spoke with pt and advised that there was a new small nodule seen on the scan and that we will plan to repeat the CT in 6 months to follow up on the nodule seen. Pt verbalized understanding and is aware he will get a call closer to 6 months to schedule his next CT. CT results faxed to PCP with follow up plans included. Order placed for 6 month follow up CT.

## 2022-07-19 ENCOUNTER — Other Ambulatory Visit: Payer: Self-pay

## 2022-07-26 ENCOUNTER — Other Ambulatory Visit: Payer: Self-pay

## 2022-07-26 ENCOUNTER — Ambulatory Visit: Payer: Medicare HMO | Attending: Family Medicine | Admitting: Family Medicine

## 2022-07-26 ENCOUNTER — Encounter: Payer: Self-pay | Admitting: Family Medicine

## 2022-07-26 VITALS — BP 103/71 | HR 109 | Ht 73.0 in | Wt 210.4 lb

## 2022-07-26 DIAGNOSIS — J449 Chronic obstructive pulmonary disease, unspecified: Secondary | ICD-10-CM

## 2022-07-26 DIAGNOSIS — R5383 Other fatigue: Secondary | ICD-10-CM

## 2022-07-26 DIAGNOSIS — H6123 Impacted cerumen, bilateral: Secondary | ICD-10-CM | POA: Diagnosis not present

## 2022-07-26 DIAGNOSIS — I110 Hypertensive heart disease with heart failure: Secondary | ICD-10-CM

## 2022-07-26 DIAGNOSIS — Z1321 Encounter for screening for nutritional disorder: Secondary | ICD-10-CM

## 2022-07-26 DIAGNOSIS — J9611 Chronic respiratory failure with hypoxia: Secondary | ICD-10-CM

## 2022-07-26 DIAGNOSIS — J9612 Chronic respiratory failure with hypercapnia: Secondary | ICD-10-CM

## 2022-07-26 DIAGNOSIS — K250 Acute gastric ulcer with hemorrhage: Secondary | ICD-10-CM

## 2022-07-26 DIAGNOSIS — G629 Polyneuropathy, unspecified: Secondary | ICD-10-CM | POA: Diagnosis not present

## 2022-07-26 DIAGNOSIS — F17210 Nicotine dependence, cigarettes, uncomplicated: Secondary | ICD-10-CM | POA: Diagnosis not present

## 2022-07-26 DIAGNOSIS — I5042 Chronic combined systolic (congestive) and diastolic (congestive) heart failure: Secondary | ICD-10-CM

## 2022-07-26 MED ORDER — METOPROLOL SUCCINATE ER 50 MG PO TB24
50.0000 mg | ORAL_TABLET | Freq: Every day | ORAL | 1 refills | Status: DC
  Filled 2022-07-26: qty 90, 90d supply, fill #0
  Filled 2022-12-06 (×2): qty 90, 90d supply, fill #1

## 2022-07-26 MED ORDER — GABAPENTIN 300 MG PO CAPS
ORAL_CAPSULE | Freq: Every day | ORAL | 1 refills | Status: DC
  Filled 2022-07-26: qty 90, fill #0
  Filled 2022-08-07: qty 90, 30d supply, fill #0
  Filled 2022-10-08: qty 90, 30d supply, fill #1

## 2022-07-26 NOTE — Patient Instructions (Signed)
Fatigue If you have fatigue, you feel tired all the time and have a lack of energy or a lack of motivation. Fatigue may make it difficult to start or complete tasks because of exhaustion. Occasional or mild fatigue is often a normal response to activity or life. However, long-term (chronic) or extreme fatigue may be a symptom of a medical condition such as: Depression. Not having enough red blood cells or hemoglobin in the blood (anemia). A problem with a small gland located in the lower front part of the neck (thyroid disorder). Rheumatologic conditions. These are problems related to the body's defense system (immune system). Infections, especially certain viral infections. Fatigue can also lead to negative health outcomes over time. Follow these instructions at home: Medicines Take over-the-counter and prescription medicines only as told by your health care provider. Take a multivitamin if told by your health care provider. Do not use herbal or dietary supplements unless they are approved by your health care provider. Eating and drinking  Avoid heavy meals in the evening. Eat a well-balanced diet, which includes lean proteins, whole grains, plenty of fruits and vegetables, and low-fat dairy products. Avoid eating or drinking too many products with caffeine in them. Avoid alcohol. Drink enough fluid to keep your urine pale yellow. Activity  Exercise regularly, as told by your health care provider. Use or practice techniques to help you relax, such as yoga, tai chi, meditation, or massage therapy. Lifestyle Change situations that cause you stress. Try to keep your work and personal schedules in balance. Do not use recreational or illegal drugs. General instructions Monitor your fatigue for any changes. Go to bed and get up at the same time every day. Avoid fatigue by pacing yourself during the day and getting enough sleep at night. Maintain a healthy weight. Contact a health care  provider if: Your fatigue does not get better. You have a fever. You suddenly lose or gain weight. You have headaches. You have trouble falling asleep or sleeping through the night. You feel angry, guilty, anxious, or sad. You have swelling in your legs or another part of your body. Get help right away if: You feel confused, feel like you might faint, or faint. Your vision is blurry or you have a severe headache. You have severe pain in your abdomen, your back, or the area between your waist and hips (pelvis). You have chest pain, shortness of breath, or an irregular or fast heartbeat. You are unable to urinate, or you urinate less than normal. You have abnormal bleeding from the rectum, nose, lungs, nipples, or, if you are male, the vagina. You vomit blood. You have thoughts about hurting yourself or others. These symptoms may be an emergency. Get help right away. Call 911. Do not wait to see if the symptoms will go away. Do not drive yourself to the hospital. Get help right away if you feel like you may hurt yourself or others, or have thoughts about taking your own life. Go to your nearest emergency room or: Call 911. Call the National Suicide Prevention Lifeline at 1-800-273-8255 or 988. This is open 24 hours a day. Text the Crisis Text Line at 741741. Summary If you have fatigue, you feel tired all the time and have a lack of energy or a lack of motivation. Fatigue may make it difficult to start or complete tasks because of exhaustion. Long-term (chronic) or extreme fatigue may be a symptom of a medical condition. Exercise regularly, as told by your health care provider.   Change situations that cause you stress. Try to keep your work and personal schedules in balance. This information is not intended to replace advice given to you by your health care provider. Make sure you discuss any questions you have with your health care provider. Document Revised: 03/20/2021 Document  Reviewed: 03/20/2021 Elsevier Patient Education  2023 [employer]  

## 2022-07-26 NOTE — Progress Notes (Signed)
Subjective:  Patient ID: Jared [name], male    DOB: 1956-01-31  Age: 67 y.o. MRN: [id number]  CC: Hypertension   HPI [name] is a 67 y.o. year old male with a history of hypertension, COPD, tobacco abuse, NICM , CHF (EF 45-50% from echo 12/2021), atrial flutter, chronic respiratory failure with hypoxia (currently on 2-3 L of oxygen), multiple hospitalizations for GI bleed with secondary anemia due to AV malformations, peptic ulcer disease, previous history of CVA.    Interval History:  He Complains of low energy has been present for some time now.  He denies presence of hematochezia or melena.  Last hemoglobin was 11.7 from 05/2022.  He is on ferrous sulfate and is also under the care of GI due to recurrent GI bleed. Also feels like he has water in his right ear. "Like when you go swimming".He has no no post nasal drip, no rhinorrhea, no nasal congestion, no sore throat.  He has a chronic cough. Smokes and states it is hard to quit.  His respiratory symptoms are at his baseline and he uses 2 L of oxygen at rest, 3 L of oxygen with exertion.  He has no chest pain, no pedal edema, no weight gain. He is adherent with his inhalers. Compliant with Xarelto and has had no palpitations, dizziness. Taking his statin as prescribed. Past Medical History:  Diagnosis Date   AVM (arteriovenous malformation)    CAD (coronary artery disease)    a. LHC 5/12:  LAD 20, pLCx 20, pRCA 40, dRCA 40, EF 35%, diff HK  //  b. Myoview 4/16: Overall Impression:  High risk stress nuclear study There is no evidence of ischemia.  There is severe LV dysfunction. LV Ejection Fraction: 30%.  LV Wall Motion:  There is global LV hypokinesis.     CAP (community acquired pneumonia) 09/2013   Chronic combined systolic and diastolic CHF (congestive heart failure) ([place])    a. Echo 4/16:Mild LVH, EF 40-45%, diffuse HK //  b. Echo 8/17: EF 35-40%, diffuse HK, diastolic dysfunction, aortic sclerosis, trivial MR, moderate LAE,  normal RVSF, moderate RAE, mild TR, PASP 42 mmHg // c. Echo 4/18: Mild concentric LVH, EF 30-35, normal wall motion, grade 1 diastolic dysfunction, PASP 49   Chronic respiratory failure (HCC)    Cluster headache    "hx; haven't had one in awhile" (01/09/2016)   COPD (chronic obstructive pulmonary disease) (HCC)    [name] 01/09/2016   DM (diabetes mellitus) ([place])    History of CVA (cerebrovascular accident)    Hypertension    IDA (iron deficiency anemia)    Moderate tobacco use disorder    NICM (nonischemic cardiomyopathy) ([place])    Nicotine addiction    Tobacco abuse    Type 2 diabetes mellitus ([place]) 05/14/2016    Past Surgical History:  Procedure Laterality Date   BIOPSY  11/12/2020   Procedure: BIOPSY;  Surgeon: Irving Copas., MD;  Location: [facility];  Service: Gastroenterology;;   CARDIAC CATHETERIZATION  10/2010   LM normal, LAD with 20% irregularities, LCX with 20%, RCA with 40% prox and 40% distal - EF of 35%   CATARACT EXTRACTION, BILATERAL     COLONOSCOPY W/ BIOPSIES AND POLYPECTOMY     COLONOSCOPY WITH PROPOFOL N/A 09/06/2018   Procedure: COLONOSCOPY WITH PROPOFOL;  Surgeon: Thornton Park, MD;  Location: [place];  Service: Gastroenterology;  Laterality: N/A;   ENTEROSCOPY N/A 09/28/2018   Procedure: ENTEROSCOPY;  Surgeon: Carol Ada, MD;  Location: [place];  Service: Endoscopy;  Laterality: N/A;   ENTEROSCOPY N/A 10/28/2018   Procedure: ENTEROSCOPY;  Surgeon: Thornton Park, MD;  Location: [place];  Service: Gastroenterology;  Laterality: N/A;   ENTEROSCOPY N/A 10/09/2020   Procedure: ENTEROSCOPY;  Surgeon: Irene Shipper, MD;  Location: [facility] ENDOSCOPY;  Service: Endoscopy;  Laterality: N/A;   ENTEROSCOPY N/A 03/22/2022   Procedure: ENTEROSCOPY;  Surgeon: Yetta Flock, MD;  Location: [facility];  Service: Gastroenterology;  Laterality: N/A;   ESOPHAGOGASTRODUODENOSCOPY N/A 11/12/2020   Procedure: ESOPHAGOGASTRODUODENOSCOPY (EGD);  Surgeon:  Irving Copas., MD;  Location: [facility];  Service: Gastroenterology;  Laterality: N/A;   ESOPHAGOGASTRODUODENOSCOPY (EGD) WITH PROPOFOL N/A 09/05/2018   Procedure: ESOPHAGOGASTRODUODENOSCOPY (EGD) WITH PROPOFOL;  Surgeon: Yetta Flock, MD;  Location: [place];  Service: Gastroenterology;  Laterality: N/A;   ESOPHAGOGASTRODUODENOSCOPY (EGD) WITH PROPOFOL N/A 11/19/2020   Procedure: ESOPHAGOGASTRODUODENOSCOPY (EGD) WITH PROPOFOL;  Surgeon: Jerene Bears, MD;  Location: [facility];  Service: Gastroenterology;  Laterality: N/A;   EXCISION MASS HEAD     GIVENS CAPSULE STUDY N/A 09/06/2018   Procedure: GIVENS CAPSULE STUDY;  Surgeon: Thornton Park, MD;  Location: [place];  Service: Gastroenterology;  Laterality: N/A;   GIVENS CAPSULE STUDY N/A 09/26/2018   Procedure: GIVENS CAPSULE STUDY;  Surgeon: Jerene Bears, MD;  Location: [place];  Service: Gastroenterology;  Laterality: N/A;   GIVENS CAPSULE STUDY N/A 06/14/2019   Procedure: GIVENS CAPSULE STUDY;  Surgeon: Mauri Pole, MD;  Location: [place] ENDOSCOPY;  Service: Endoscopy;  Laterality: N/A;   HEMOSTASIS CLIP PLACEMENT  11/12/2020   Procedure: HEMOSTASIS CLIP PLACEMENT;  Surgeon: Irving Copas., MD;  Location: [facility];  Service: Gastroenterology;;   HEMOSTASIS CONTROL  11/12/2020   Procedure: HEMOSTASIS CONTROL;  Surgeon: Irving Copas., MD;  Location: [facility];  Service: Gastroenterology;;   HOT HEMOSTASIS N/A 10/28/2018   Procedure: HOT HEMOSTASIS (ARGON PLASMA COAGULATION/BICAP);  Surgeon: Thornton Park, MD;  Location: [place];  Service: Gastroenterology;  Laterality: N/A;   HOT HEMOSTASIS N/A 11/12/2020   Procedure: HOT HEMOSTASIS (ARGON PLASMA COAGULATION/BICAP);  Surgeon: Irving Copas., MD;  Location: [facility];  Service: Gastroenterology;  Laterality: N/A;   HOT HEMOSTASIS N/A 03/22/2022   Procedure: HOT HEMOSTASIS (ARGON PLASMA COAGULATION/BICAP);  Surgeon:  Yetta Flock, MD;  Location: [facility] ENDOSCOPY;  Service: Gastroenterology;  Laterality: N/A;   INCISION AND DRAINAGE PERIRECTAL ABSCESS N/A 06/05/2017   Procedure: IRRIGATION AND DEBRIDEMENT PERIRECTAL ABSCESS;  Surgeon: Ileana Roup, MD;  Location: [place];  Service: General;  Laterality: N/A;   SUBMUCOSAL TATTOO INJECTION  11/12/2020   Procedure: SUBMUCOSAL TATTOO INJECTION;  Surgeon: Irving Copas., MD;  Location: [facility];  Service: Gastroenterology;;   VIDEO BRONCHOSCOPY Bilateral 05/08/2016   Procedure: VIDEO BRONCHOSCOPY WITH FLUORO;  Surgeon: Rigoberto Noel, MD;  Location: [place];  Service: Cardiopulmonary;  Laterality: Bilateral;    Family History  Problem Relation Age of Onset   Heart disease Mother    Diabetes Mother    Colon cancer Mother    Liver cancer Mother    Cancer Father        type unknown   Diabetes Sister        x 2   Diabetes Brother     Social History   Socioeconomic History   Marital status: Single    Spouse name: Not on file   Number of children: 1   Years of education: Not on file   Highest education level: Not on file  Occupational  History   Occupation: retired  Tobacco Use   Smoking status: Every Day    Packs/day: 0.50    Years: 47.00    Total pack years: 23.50    Types: Cigarettes   Smokeless tobacco: Never   Tobacco comments:    4/5 cigs  Vaping Use   Vaping Use: Never used  Substance and Sexual Activity   Alcohol use: Not Currently    Alcohol/week: 0.0 standard drinks of alcohol    Comment: last drink was before xmas   Drug use: No    Types: Cocaine, Marijuana    Comment: "nothing in 20 years"   Sexual activity: Not Currently  Other Topics Concern   Not on file  Social History Narrative   unemployed   Social Determinants of Health   Financial Resource Strain: Low Risk  (05/25/2022)   Overall Financial Resource Strain (CARDIA)    Difficulty of Paying Living Expenses: Not hard at all  Food Insecurity:  No Food Insecurity (05/25/2022)   Hunger Vital Sign    Worried About Running Out of Food in the Last Year: Never true    Ran Out of Food in the Last Year: Never true  Transportation Needs: No Transportation Needs (05/25/2022)   PRAPARE - Hydrologist (Medical): No    Lack of Transportation (Non-Medical): No  Physical Activity: Inactive (05/25/2022)   Exercise Vital Sign    Days of Exercise per Week: 0 days    Minutes of Exercise per Session: 0 min  Stress: No Stress Concern Present (05/25/2022)   [place]    Feeling of Stress : Not at all  Social Connections: Unknown (05/25/2022)   Social Connection and Isolation Panel [NHANES]    Frequency of Communication with Friends and Family: Three times a week    Frequency of Social Gatherings with Friends and Family: Never    Attends Religious Services: Not on Advertising copywriter or Organizations: No    Attends Archivist Meetings: Never    Marital Status: Divorced    Allergies  Allergen Reactions   Lisinopril Cough    Outpatient Medications Prior to Visit  Medication Sig Dispense Refill   Accu-Chek Softclix Lancets lancets Use as instructed daily before meals 100 each 12   albuterol (PROVENTIL) (2.5 MG/3ML) 0.083% nebulizer solution Take 3 mLs by nebulization every 6 (six) hours as needed for wheezing or shortness of breath. 90 mL 3   albuterol (VENTOLIN HFA) 108 (90 Base) MCG/ACT inhaler INHALE 2 PUFFS INTO THE LUNGS EVERY 6 (SIX) HOURS AS NEEDED FOR WHEEZING OR SHORTNESS OF BREATH. (Patient taking differently: Inhale 2 puffs into the lungs 3 (three) times daily as needed for wheezing or shortness of breath.) 18 g 5   Aspirin Effervescent (ALKA-SELTZER ORIGINAL PO) Take 1 packet by mouth 2 (two) times daily as needed (bloat).     atorvastatin (LIPITOR) 80 MG tablet Take 1 tablet (80 mg total) by mouth daily. 90 tablet 1    Blood Glucose Monitoring Suppl (ACCU-CHEK GUIDE) w/Device KIT Use as directed three times daily 1 kit 0   Budeson-Glycopyrrol-Formoterol (BREZTRI AEROSPHERE) 160-9-4.8 MCG/ACT AERO Take 2 puffs first thing in morning and then another 2 puffs about 12 hours later. (Patient taking differently: Inhale 2 puffs into the lungs in the morning and at bedtime.) 10.7 g 6   ezetimibe (ZETIA) 10 MG tablet Take 1 tablet (10 mg total) by mouth  daily. 90 tablet 3   ferrous sulfate 325 (65 FE) MG tablet Take 1 tablet (325 mg total) by mouth daily with breakfast. 60 tablet 1   folic acid (FOLVITE) 1 MG tablet Take 1 tablet (1 mg total) by mouth daily. 30 tablet 2   furosemide (LASIX) 40 MG tablet Take 1 tablet (40 mg total) by mouth 2 (two) times daily. 60 tablet 2   glucose blood (ACCU-CHEK GUIDE) test strip Use as instructed daily 100 each 12   hydrALAZINE (APRESOLINE) 50 MG tablet Take 1 tablet (50 mg total) by mouth every 8 (eight) hours. 270 tablet 1   isosorbide mononitrate (IMDUR) 30 MG 24 hr tablet Take 1 tablet (30 mg total) by mouth daily. 90 tablet 1   OXYGEN Inhale 2 L/min into the lungs as directed. On exertion or when walking     pantoprazole (PROTONIX) 40 MG tablet Take 1 tablet (40 mg total) by mouth daily. 90 tablet 0   rivaroxaban (XARELTO) 20 MG TABS tablet Take 1 tablet (20 mg total) by mouth daily with supper. 90 tablet 1   tiZANidine (ZANAFLEX) 2 MG tablet Take 1 tablet (2 mg total) by mouth at bedtime. 30 tablet 0   carvedilol (COREG) 3.125 MG tablet Take 1 tablet (3.125 mg total) by mouth 2 (two) times daily with a meal. 180 tablet 1   gabapentin (NEURONTIN) 300 MG capsule TAKE 1 CAPSULE (300 MG TOTAL) BY MOUTH AT BEDTIME. (Patient taking differently: Take 300 mg by mouth at bedtime.) 90 capsule 1   nitroGLYCERIN (NITROSTAT) 0.4 MG SL tablet Place 1 tablet (0.4 mg total) under the tongue every 5 (five) minutes as needed for chest pain.     No facility-administered medications prior to visit.      ROS Review of Systems  Constitutional:  Positive for fatigue. Negative for activity change and appetite change.  HENT:  Positive for ear pain. Negative for sinus pressure and sore throat.   Respiratory:  Negative for chest tightness, shortness of breath and wheezing.   Cardiovascular:  Negative for chest pain and palpitations.  Gastrointestinal:  Negative for abdominal distention, abdominal pain and constipation.  Genitourinary: Negative.   Musculoskeletal: Negative.   Psychiatric/Behavioral:  Negative for behavioral problems and dysphoric mood.     Objective:  BP 103/71   Pulse (!) 109   Ht 6' 1"$  (1.854 m)   Wt 210 lb 6.4 oz (95.4 kg)   SpO2 96%   BMI 27.76 kg/m      07/26/2022    8:42 AM 07/04/2022    9:44 AM 04/25/2022    9:42 AM  BP/Weight  Systolic BP [id number]  Diastolic BP 71 60 64  Wt. (Lbs) 210.4 212.8 215.8  BMI 27.76 kg/m2 26.6 kg/m2 27.71 kg/m2      Physical Exam Constitutional:      Appearance: He is well-developed.  HENT:     Right Ear: There is impacted cerumen.     Left Ear: There is impacted cerumen.     Nose:     Comments: On 3 L of oxygen via nasal cannula Cardiovascular:     Rate and Rhythm: Tachycardia present.     Heart sounds: Normal heart sounds. No murmur heard. Pulmonary:     Effort: Pulmonary effort is normal.     Breath sounds: Normal breath sounds. No wheezing or rales.  Chest:     Chest wall: No tenderness.  Abdominal:     General: Bowel sounds are normal. There is  no distension.     Palpations: Abdomen is soft. There is no mass.     Tenderness: There is no abdominal tenderness.  Musculoskeletal:        General: Normal range of motion.     Right lower leg: No edema.     Left lower leg: No edema.  Neurological:     Mental Status: He is alert and oriented to person, place, and time.  Psychiatric:        Mood and Affect: Mood normal.        Latest Ref Rng & Units 03/24/2022    1:09 AM 03/23/2022    1:57 AM  03/22/2022    5:51 AM  CMP  Glucose 70 - 99 mg/dL 139  127  173   BUN 8 - 23 mg/dL 14  5  8   $ Creatinine 0.61 - 1.24 mg/dL 1.14  0.87  0.76   Sodium 135 - 145 mmol/L 142  144  142   Potassium 3.5 - 5.1 mmol/L 3.6  4.2  3.9   Chloride 98 - 111 mmol/L 99  101  97   CO2 22 - 32 mmol/L 38  36  33   Calcium 8.9 - 10.3 mg/dL 8.8  9.2  9.4     Lipid Panel     Component Value Date/Time   CHOL 176 12/18/2021 1121   TRIG 167 (H) 12/18/2021 1121   HDL 58 12/18/2021 1121   CHOLHDL 2.9 01/29/2020 1020   CHOLHDL 2.8 01/11/2016 0446   VLDL 14 01/11/2016 0446   LDLCALC 89 12/18/2021 1121    CBC    Component Value Date/Time   WBC 7.5 06/07/2022 1111   RBC 4.13 (L) 06/07/2022 1111   HGB 11.7 (L) 06/07/2022 1111   HGB 17.1 09/20/2021 1620   HCT 37.1 (L) 06/07/2022 1111   HCT 53.6 (H) 09/20/2021 1620   PLT 233.0 06/07/2022 1111   PLT 236 09/20/2021 1620   MCV 89.8 06/07/2022 1111   MCV 88 09/20/2021 1620   MCH 22.2 (L) 03/24/2022 0109   MCHC 31.6 06/07/2022 1111   RDW 18.5 (H) 06/07/2022 1111   RDW 12.8 09/20/2021 1620   LYMPHSABS 1.9 06/07/2022 1111   LYMPHSABS 3.0 09/20/2021 1620   MONOABS 0.8 06/07/2022 1111   EOSABS 0.1 06/07/2022 1111   EOSABS 0.1 09/20/2021 1620   BASOSABS 0.1 06/07/2022 1111   BASOSABS 0.1 09/20/2021 1620    Lab Results  Component Value Date   HGBA1C 5.6 03/21/2022    Lab Results  Component Value Date   TSH 0.373 03/21/2022    Assessment & Plan:  1. Neuropathy Stable - gabapentin (NEURONTIN) 300 MG capsule; TAKE 1 CAPSULE (300 MG TOTAL) BY MOUTH AT BEDTIME.  Dispense: 90 capsule; Refill: 1  2. Bilateral impacted cerumen Could explain his ear symptoms Bilateral ear irrigation performed  3. Other fatigue Will need to evaluate for anemia as possible etiology Will also check thyroid and vitamin D levels - VITAMIN D 25 Hydroxy (Vit-D Deficiency, Fractures) - TSH - T4, free  4. Hypertensive heart disease with chronic combined systolic and  diastolic congestive heart failure ([place]) Euvolemic with EF of 45-50% from echo of 12/2021 Switched from Coreg to Toprol due to his COPD as the latter is a selective beta-blocker to prevent COPD exacerbations - metoprolol succinate (TOPROL-XL) 50 MG 24 hr tablet; Take 1 tablet (50 mg total) by mouth daily. Take with or immediately following a meal.  Dispense: 90 tablet; Refill: 1  5.  Chronic respiratory failure with hypoxia and hypercapnia (HCC) Stable on oxygen of 2 to 3 L  6. Acute gastric ulcer with hemorrhage Asymptomatic Will check CBC He is currently under GI care - CBC with Differential/Platelet  7. Encounter for vitamin deficiency screening Will screen for vitamin D deficiency especially given complaint of fatigue - VITAMIN D 25 Hydroxy (Vit-D Deficiency, Fractures)  8. COPD GOLD III/still smoking  No exacerbations Continue inhalers Followed by pulmonary Encourage smoking cessation will be beneficial  9. Smoking greater than 20 pack years He is not ready to quit at this time We spent 3 minutes discussing sequences of ongoing tobacco use   Health Care Maintenance: Up-to-date on lung cancer screening. Meds ordered this encounter  Medications   metoprolol succinate (TOPROL-XL) 50 MG 24 hr tablet    Sig: Take 1 tablet (50 mg total) by mouth daily. Take with or immediately following a meal.    Dispense:  90 tablet    Refill:  1    Discontinue Coreg   gabapentin (NEURONTIN) 300 MG capsule    Sig: TAKE 1 CAPSULE (300 MG TOTAL) BY MOUTH AT BEDTIME.    Dispense:  90 capsule    Refill:  1    Follow-up: Return in about 3 months (around 10/24/2022) for Chronic medical conditions.       Charlott Rakes, MD, FAAFP. [facility] [place], [place]   07/26/2022, 10:20 AM

## 2022-07-27 ENCOUNTER — Other Ambulatory Visit: Payer: Self-pay

## 2022-07-27 ENCOUNTER — Other Ambulatory Visit: Payer: Self-pay | Admitting: Family Medicine

## 2022-07-27 LAB — CBC WITH DIFFERENTIAL/PLATELET
Basophils Absolute: 0.1 10*3/uL (ref 0.0–0.2)
Basos: 1 %
EOS (ABSOLUTE): 0.1 10*3/uL (ref 0.0–0.4)
Eos: 2 %
Hematocrit: 40.7 % (ref 37.5–51.0)
Hemoglobin: 12.3 g/dL — ABNORMAL LOW (ref 13.0–17.7)
Immature Grans (Abs): 0 10*3/uL (ref 0.0–0.1)
Immature Granulocytes: 0 %
Lymphocytes Absolute: 2.1 10*3/uL (ref 0.7–3.1)
Lymphs: 24 %
MCH: 28.9 pg (ref 26.6–33.0)
MCHC: 30.2 g/dL — ABNORMAL LOW (ref 31.5–35.7)
MCV: 96 fL (ref 79–97)
Monocytes Absolute: 0.7 10*3/uL (ref 0.1–0.9)
Monocytes: 8 %
Neutrophils Absolute: 5.8 10*3/uL (ref 1.4–7.0)
Neutrophils: 65 %
Platelets: 266 10*3/uL (ref 150–450)
RBC: 4.25 x10E6/uL (ref 4.14–5.80)
RDW: 12.3 % (ref 11.6–15.4)
WBC: 8.8 10*3/uL (ref 3.4–10.8)

## 2022-07-27 LAB — T4, FREE: Free T4: 1.15 ng/dL (ref 0.82–1.77)

## 2022-07-27 LAB — TSH: TSH: 1.02 u[IU]/mL (ref 0.450–4.500)

## 2022-07-27 LAB — VITAMIN D 25 HYDROXY (VIT D DEFICIENCY, FRACTURES): Vit D, 25-Hydroxy: 26.5 ng/mL — ABNORMAL LOW (ref 30.0–100.0)

## 2022-07-27 MED ORDER — ERGOCALCIFEROL 1.25 MG (50000 UT) PO CAPS
50000.0000 [IU] | ORAL_CAPSULE | ORAL | 1 refills | Status: DC
  Filled 2022-07-27: qty 12, 84d supply, fill #0
  Filled 2022-12-06 (×2): qty 4, 28d supply, fill #1
  Filled 2023-01-03: qty 4, 28d supply, fill #2
  Filled 2023-01-30 (×2): qty 4, 28d supply, fill #3

## 2022-08-06 ENCOUNTER — Other Ambulatory Visit: Payer: Self-pay | Admitting: Internal Medicine

## 2022-08-06 ENCOUNTER — Other Ambulatory Visit: Payer: Self-pay | Admitting: Family Medicine

## 2022-08-06 ENCOUNTER — Other Ambulatory Visit: Payer: Self-pay

## 2022-08-06 DIAGNOSIS — I110 Hypertensive heart disease with heart failure: Secondary | ICD-10-CM

## 2022-08-06 MED ORDER — BREZTRI AEROSPHERE 160-9-4.8 MCG/ACT IN AERO
INHALATION_SPRAY | RESPIRATORY_TRACT | 6 refills | Status: DC
  Filled 2022-08-06: qty 10.7, 30d supply, fill #0
  Filled 2022-09-05: qty 10.7, 30d supply, fill #1
  Filled 2022-10-08: qty 10.7, 30d supply, fill #2
  Filled 2022-11-06: qty 10.7, 30d supply, fill #3
  Filled 2022-12-06 (×3): qty 10.7, 30d supply, fill #4
  Filled 2023-01-03: qty 10.7, 30d supply, fill #5
  Filled 2023-02-04: qty 10.7, 30d supply, fill #6

## 2022-08-07 ENCOUNTER — Other Ambulatory Visit: Payer: Self-pay

## 2022-08-13 ENCOUNTER — Telehealth: Payer: Self-pay

## 2022-08-13 NOTE — Telephone Encounter (Signed)
-----   Message from [place], MD sent at 08/13/2022 11:50 AM EST ----- Regarding: RE: Labs No, he does not need labs at this time.  I saw the February 15 results, and his PCP is managing his iron treatment and follow-up labs.  HD ----- Message ----- From: Yevette Edwards, RN Sent: 08/13/2022   9:23 AM EST To: Doran Stabler, MD Subject: FW: Labs                                       Dr. Loletha Carrow, I had a reminder that patient is due for repeat CBC and iron studies at this time. Pt had a CBC drawn on 07/26/22. Do you still want patient to complete repeat labs at this time? ----- Message ----- From: Yevette Edwards, RN Sent: 08/13/2022  12:00 AM EST To: Yevette Edwards, RN Subject: Labs                                           CBC, IBC + Ferritin - orders are in epic

## 2022-08-16 ENCOUNTER — Encounter: Payer: Self-pay | Admitting: Radiology

## 2022-08-28 ENCOUNTER — Other Ambulatory Visit: Payer: Self-pay

## 2022-09-05 ENCOUNTER — Other Ambulatory Visit: Payer: Self-pay

## 2022-09-06 ENCOUNTER — Other Ambulatory Visit: Payer: Self-pay

## 2022-10-08 ENCOUNTER — Other Ambulatory Visit: Payer: Self-pay

## 2022-10-08 ENCOUNTER — Other Ambulatory Visit: Payer: Self-pay | Admitting: Family Medicine

## 2022-10-08 DIAGNOSIS — K250 Acute gastric ulcer with hemorrhage: Secondary | ICD-10-CM

## 2022-10-09 ENCOUNTER — Other Ambulatory Visit: Payer: Self-pay

## 2022-10-09 MED ORDER — FOLIC ACID 1 MG PO TABS
1.0000 mg | ORAL_TABLET | Freq: Every day | ORAL | 0 refills | Status: DC
  Filled 2022-10-09: qty 30, 30d supply, fill #0

## 2022-10-09 MED ORDER — PANTOPRAZOLE SODIUM 40 MG PO TBEC
40.0000 mg | DELAYED_RELEASE_TABLET | Freq: Every day | ORAL | 0 refills | Status: DC
  Filled 2022-10-09: qty 30, 30d supply, fill #0

## 2022-10-10 ENCOUNTER — Other Ambulatory Visit: Payer: Self-pay

## 2022-10-10 ENCOUNTER — Ambulatory Visit (INDEPENDENT_AMBULATORY_CARE_PROVIDER_SITE_OTHER): Payer: Medicare HMO | Admitting: Orthopaedic Surgery

## 2022-10-10 DIAGNOSIS — G8929 Other chronic pain: Secondary | ICD-10-CM | POA: Diagnosis not present

## 2022-10-10 DIAGNOSIS — M25562 Pain in left knee: Secondary | ICD-10-CM | POA: Diagnosis not present

## 2022-10-10 DIAGNOSIS — M5410 Radiculopathy, site unspecified: Secondary | ICD-10-CM

## 2022-10-10 DIAGNOSIS — G629 Polyneuropathy, unspecified: Secondary | ICD-10-CM

## 2022-10-10 MED ORDER — GABAPENTIN 300 MG PO CAPS
300.0000 mg | ORAL_CAPSULE | Freq: Two times a day (BID) | ORAL | 1 refills | Status: DC
  Filled 2022-10-10 – 2022-12-06 (×3): qty 90, 45d supply, fill #0
  Filled 2023-01-30 (×2): qty 90, 45d supply, fill #1

## 2022-10-10 NOTE — Progress Notes (Signed)
The patient comes in with continued left knee pain but also radicular symptoms going down his left leg.  He reports burning pain and neuropathy in his feet.  He is a diabetic but I do not see any recent hemoglobin A1c.  [facility]  He does ambulate with a rolling walker.  He is on chronic oxygen and is on chronic blood thinning medication with Xarelto.  He is not really a surgical candidate.  He has had a steroid injection just in February in that left knee and he said it did not really help at all.  On my exam today his left knee shows no effusion with full range of motion.  It is painful when I lift his leg and it is more indicative of positive straight leg raise and he does report burning in both feet and pain going down into his ankle.  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  X-rays of the lumbar spine show loss of lumbar lordosis with degenerative changes between L5 and S1.  At this point he is on gabapentin 300 mg at night and I would like to increase this to twice daily and he may need to eventually go up to 3 times a day.  I would like to obtain an MRI of his lumbar spine to rule out any type of nerve compression given the radicular symptoms he has going down his left leg.  We can then go from there in terms of figuring out the best treatment for him.  He says he is chronically fatigued and still needs to follow-up with the [facility] for that aspect of his health.

## 2022-10-25 ENCOUNTER — Other Ambulatory Visit: Payer: Self-pay

## 2022-10-25 ENCOUNTER — Ambulatory Visit: Payer: Medicare HMO | Attending: Family Medicine | Admitting: Family Medicine

## 2022-10-25 ENCOUNTER — Encounter: Payer: Self-pay | Admitting: Family Medicine

## 2022-10-25 VITALS — BP 116/66 | HR 79 | Ht 73.0 in | Wt 221.0 lb

## 2022-10-25 DIAGNOSIS — I4892 Unspecified atrial flutter: Secondary | ICD-10-CM

## 2022-10-25 DIAGNOSIS — F17210 Nicotine dependence, cigarettes, uncomplicated: Secondary | ICD-10-CM

## 2022-10-25 DIAGNOSIS — G8929 Other chronic pain: Secondary | ICD-10-CM

## 2022-10-25 DIAGNOSIS — E1169 Type 2 diabetes mellitus with other specified complication: Secondary | ICD-10-CM

## 2022-10-25 DIAGNOSIS — I110 Hypertensive heart disease with heart failure: Secondary | ICD-10-CM

## 2022-10-25 DIAGNOSIS — R2681 Unsteadiness on feet: Secondary | ICD-10-CM | POA: Diagnosis not present

## 2022-10-25 DIAGNOSIS — K250 Acute gastric ulcer with hemorrhage: Secondary | ICD-10-CM | POA: Diagnosis not present

## 2022-10-25 DIAGNOSIS — G629 Polyneuropathy, unspecified: Secondary | ICD-10-CM

## 2022-10-25 DIAGNOSIS — L299 Pruritus, unspecified: Secondary | ICD-10-CM

## 2022-10-25 DIAGNOSIS — I700 Atherosclerosis of aorta: Secondary | ICD-10-CM | POA: Insufficient documentation

## 2022-10-25 DIAGNOSIS — N469 Male infertility, unspecified: Secondary | ICD-10-CM | POA: Diagnosis not present

## 2022-10-25 DIAGNOSIS — I739 Peripheral vascular disease, unspecified: Secondary | ICD-10-CM | POA: Insufficient documentation

## 2022-10-25 DIAGNOSIS — Z125 Encounter for screening for malignant neoplasm of prostate: Secondary | ICD-10-CM

## 2022-10-25 LAB — POCT GLYCOSYLATED HEMOGLOBIN (HGB A1C): HbA1c, POC (controlled diabetic range): 5 % (ref 0.0–7.0)

## 2022-10-25 LAB — POCT ABI - SCREENING FOR PILOT NO CHARGE

## 2022-10-25 MED ORDER — FUROSEMIDE 40 MG PO TABS
40.0000 mg | ORAL_TABLET | Freq: Two times a day (BID) | ORAL | 1 refills | Status: DC
Start: 2022-10-25 — End: 2023-01-09
  Filled 2022-10-25 – 2022-11-06 (×2): qty 180, 90d supply, fill #0

## 2022-10-25 MED ORDER — TERBINAFINE HCL 1 % EX CREA
1.0000 | TOPICAL_CREAM | Freq: Two times a day (BID) | CUTANEOUS | 1 refills | Status: DC
Start: 2022-10-25 — End: 2022-12-28
  Filled 2022-10-25: qty 42, 21d supply, fill #0

## 2022-10-25 MED ORDER — RIVAROXABAN 20 MG PO TABS
20.0000 mg | ORAL_TABLET | Freq: Every day | ORAL | 1 refills | Status: DC
Start: 2022-10-25 — End: 2023-06-06
  Filled 2022-10-25 – 2022-12-06 (×3): qty 90, 90d supply, fill #0
  Filled 2023-01-03 – 2023-02-24 (×2): qty 90, 90d supply, fill #1

## 2022-10-25 MED ORDER — PANTOPRAZOLE SODIUM 40 MG PO TBEC
40.0000 mg | DELAYED_RELEASE_TABLET | Freq: Every day | ORAL | 1 refills | Status: DC
Start: 2022-10-25 — End: 2023-01-30
  Filled 2022-10-25 – 2022-11-06 (×2): qty 90, 90d supply, fill #0

## 2022-10-25 MED ORDER — ATORVASTATIN CALCIUM 80 MG PO TABS
80.0000 mg | ORAL_TABLET | Freq: Every day | ORAL | 1 refills | Status: DC
Start: 2022-10-25 — End: 2023-01-30
  Filled 2022-10-25: qty 90, 90d supply, fill #0
  Filled 2023-01-03 – 2023-01-30 (×3): qty 90, 90d supply, fill #1

## 2022-10-25 NOTE — Progress Notes (Signed)
Pain in left leg Feet itching.

## 2022-10-25 NOTE — Progress Notes (Signed)
Subjective:  Patient ID: Jared [name], male    DOB: 24-Apr-1956  Age: 67 y.o. MRN: [id number]  CC: Diabetes   HPI [name] is a 67 y.o. year old male with a history of hypertension, COPD, tobacco abuse, NICM , CHF (EF 45-50% from echo 12/2021), atrial flutter, chronic respiratory failure with hypoxia (currently on 2-3 L of oxygen), multiple hospitalizations for GI bleed with secondary anemia due to AV malformations, peptic ulcer disease, previous history of CVA, Type 2 DM  Interval History:  His left leg has been painful.  2 weeks ago he did see Dr. Magnus Ivan orthopedics for left knee pain and left leg radiculopathy.  Gabapentin dose was increased and MRI of lumbar spine ordered. Pain occurs in both feet but left >right and this is worse with walking and improves on resting. He feels wobbly and shaky on his feet and he jerks a lot unintentionally. He has no back pain. A1c is 5.0 Complains of itching in both feet for an unknown duration.  He was stationed at [employer] and is wondering if he is having some of the symptoms that have been portrayed on the media.  Wondering if he has Parkinsons and also he has been unable to father a child ever since he returned from camp [place].  He has no hematochezia Past Medical History:  Diagnosis Date   AVM (arteriovenous malformation)    CAD (coronary artery disease)    a. LHC 5/12:  LAD 20, pLCx 20, pRCA 40, dRCA 40, EF 35%, diff HK  //  b. Myoview 4/16: Overall Impression:  High risk stress nuclear study There is no evidence of ischemia.  There is severe LV dysfunction. LV Ejection Fraction: 30%.  LV Wall Motion:  There is global LV hypokinesis.     CAP (community acquired pneumonia) 09/2013   Chronic combined systolic and diastolic CHF (congestive heart failure) (HCC)    a. Echo 4/16:Mild LVH, EF 40-45%, diffuse HK //  b. Echo 8/17: EF 35-40%, diffuse HK, diastolic dysfunction, aortic sclerosis, trivial MR, moderate LAE, normal RVSF, moderate  RAE, mild TR, PASP 42 mmHg // c. Echo 4/18: Mild concentric LVH, EF 30-35, normal wall motion, grade 1 diastolic dysfunction, PASP 49   Chronic respiratory failure (HCC)    Cluster headache    "hx; haven't had one in awhile" (01/09/2016)   COPD (chronic obstructive pulmonary disease) (HCC)    [name] 01/09/2016   DM (diabetes mellitus) (HCC)    History of CVA (cerebrovascular accident)    Hypertension    IDA (iron deficiency anemia)    Moderate tobacco use disorder    NICM (nonischemic cardiomyopathy) (HCC)    Nicotine addiction    Tobacco abuse    Type 2 diabetes mellitus (HCC) 05/14/2016    Past Surgical History:  Procedure Laterality Date   BIOPSY  11/12/2020   Procedure: BIOPSY;  Surgeon: Lemar Lofty., MD;  Location: [facility] ENDOSCOPY;  Service: Gastroenterology;;   CARDIAC CATHETERIZATION  10/2010   LM normal, LAD with 20% irregularities, LCX with 20%, RCA with 40% prox and 40% distal - EF of 35%   CATARACT EXTRACTION, BILATERAL     COLONOSCOPY W/ BIOPSIES AND POLYPECTOMY     COLONOSCOPY WITH PROPOFOL N/A 09/06/2018   Procedure: COLONOSCOPY WITH PROPOFOL;  Surgeon: Tressia Danas, MD;  Location: [facility] ENDOSCOPY;  Service: Gastroenterology;  Laterality: N/A;   ENTEROSCOPY N/A 09/28/2018   Procedure: ENTEROSCOPY;  Surgeon: Jeani Hawking, MD;  Location: [facility] ENDOSCOPY;  Service: Endoscopy;  Laterality:  N/A;   ENTEROSCOPY N/A 10/28/2018   Procedure: ENTEROSCOPY;  Surgeon: Tressia Danas, MD;  Location: [facility] ENDOSCOPY;  Service: Gastroenterology;  Laterality: N/A;   ENTEROSCOPY N/A 10/09/2020   Procedure: ENTEROSCOPY;  Surgeon: Hilarie Fredrickson, MD;  Location: [facility] ENDOSCOPY;  Service: Endoscopy;  Laterality: N/A;   ENTEROSCOPY N/A 03/22/2022   Procedure: ENTEROSCOPY;  Surgeon: Benancio Deeds, MD;  Location: [facility] ENDOSCOPY;  Service: Gastroenterology;  Laterality: N/A;   ESOPHAGOGASTRODUODENOSCOPY N/A 11/12/2020   Procedure: ESOPHAGOGASTRODUODENOSCOPY (EGD);  Surgeon: Lemar Lofty., MD;  Location: [facility] ENDOSCOPY;  Service: Gastroenterology;  Laterality: N/A;   ESOPHAGOGASTRODUODENOSCOPY (EGD) WITH PROPOFOL N/A 09/05/2018   Procedure: ESOPHAGOGASTRODUODENOSCOPY (EGD) WITH PROPOFOL;  Surgeon: Benancio Deeds, MD;  Location: [facility] ENDOSCOPY;  Service: Gastroenterology;  Laterality: N/A;   ESOPHAGOGASTRODUODENOSCOPY (EGD) WITH PROPOFOL N/A 11/19/2020   Procedure: ESOPHAGOGASTRODUODENOSCOPY (EGD) WITH PROPOFOL;  Surgeon: Beverley Fiedler, MD;  Location: [facility] ENDOSCOPY;  Service: Gastroenterology;  Laterality: N/A;   EXCISION MASS HEAD     GIVENS CAPSULE STUDY N/A 09/06/2018   Procedure: GIVENS CAPSULE STUDY;  Surgeon: Tressia Danas, MD;  Location: [facility] ENDOSCOPY;  Service: Gastroenterology;  Laterality: N/A;   GIVENS CAPSULE STUDY N/A 09/26/2018   Procedure: GIVENS CAPSULE STUDY;  Surgeon: Beverley Fiedler, MD;  Location: [facility] ENDOSCOPY;  Service: Gastroenterology;  Laterality: N/A;   GIVENS CAPSULE STUDY N/A 06/14/2019   Procedure: GIVENS CAPSULE STUDY;  Surgeon: Napoleon Form, MD;  Location: [facility] ENDOSCOPY;  Service: Endoscopy;  Laterality: N/A;   HEMOSTASIS CLIP PLACEMENT  11/12/2020   Procedure: HEMOSTASIS CLIP PLACEMENT;  Surgeon: Lemar Lofty., MD;  Location: [facility] ENDOSCOPY;  Service: Gastroenterology;;   HEMOSTASIS CONTROL  11/12/2020   Procedure: HEMOSTASIS CONTROL;  Surgeon: Lemar Lofty., MD;  Location: [facility] ENDOSCOPY;  Service: Gastroenterology;;   HOT HEMOSTASIS N/A 10/28/2018   Procedure: HOT HEMOSTASIS (ARGON PLASMA COAGULATION/BICAP);  Surgeon: Tressia Danas, MD;  Location: [facility] ENDOSCOPY;  Service: Gastroenterology;  Laterality: N/A;   HOT HEMOSTASIS N/A 11/12/2020   Procedure: HOT HEMOSTASIS (ARGON PLASMA COAGULATION/BICAP);  Surgeon: Lemar Lofty., MD;  Location: [facility] ENDOSCOPY;  Service: Gastroenterology;  Laterality: N/A;   HOT HEMOSTASIS N/A 03/22/2022   Procedure: HOT HEMOSTASIS (ARGON PLASMA COAGULATION/BICAP);  Surgeon: Benancio Deeds,  MD;  Location: [facility] ENDOSCOPY;  Service: Gastroenterology;  Laterality: N/A;   INCISION AND DRAINAGE PERIRECTAL ABSCESS N/A 06/05/2017   Procedure: IRRIGATION AND DEBRIDEMENT PERIRECTAL ABSCESS;  Surgeon: Andria Meuse, MD;  Location: [facility] OR;  Service: General;  Laterality: N/A;   SUBMUCOSAL TATTOO INJECTION  11/12/2020   Procedure: SUBMUCOSAL TATTOO INJECTION;  Surgeon: Lemar Lofty., MD;  Location: [facility] ENDOSCOPY;  Service: Gastroenterology;;   VIDEO BRONCHOSCOPY Bilateral 05/08/2016   Procedure: VIDEO BRONCHOSCOPY WITH FLUORO;  Surgeon: Oretha Milch, MD;  Location: [facility] ENDOSCOPY;  Service: Cardiopulmonary;  Laterality: Bilateral;    Family History  Problem Relation Age of Onset   Heart disease Mother    Diabetes Mother    Colon cancer Mother    Liver cancer Mother    Cancer Father        type unknown   Diabetes Sister        x 2   Diabetes Brother     Social History   Socioeconomic History   Marital status: Single    Spouse name: Not on file   Number of children: 1   Years of education: Not on file   Highest education level: Not on file  Occupational History   Occupation:  retired  Tobacco Use   Smoking status: Every Day    Packs/day: 0.50    Years: 47.00    Additional pack years: 0.00    Total pack years: 23.50    Types: Cigarettes   Smokeless tobacco: Never   Tobacco comments:    4/5 cigs  Vaping Use   Vaping Use: Never used  Substance and Sexual Activity   Alcohol use: Not Currently    Alcohol/week: 0.0 standard drinks of alcohol    Comment: last drink was before xmas   Drug use: No    Types: Cocaine, Marijuana    Comment: "nothing in 20 years"   Sexual activity: Not Currently  Other Topics Concern   Not on file  Social History Narrative   unemployed   Social Determinants of Health   Financial Resource Strain: Low Risk  (05/25/2022)   Overall Financial Resource Strain (CARDIA)    Difficulty of Paying Living Expenses: Not hard at all  Food  Insecurity: No Food Insecurity (05/25/2022)   Hunger Vital Sign    Worried About Running Out of Food in the Last Year: Never true    Ran Out of Food in the Last Year: Never true  Transportation Needs: No Transportation Needs (05/25/2022)   PRAPARE - Administrator, Civil Service (Medical): No    Lack of Transportation (Non-Medical): No  Physical Activity: Inactive (05/25/2022)   Exercise Vital Sign    Days of Exercise per Week: 0 days    Minutes of Exercise per Session: 0 min  Stress: No Stress Concern Present (05/25/2022)   [employer] of Occupational Health - Occupational Stress Questionnaire    Feeling of Stress : Not at all  Social Connections: Unknown (05/25/2022)   Social Connection and Isolation Panel [NHANES]    Frequency of Communication with Friends and Family: Three times a week    Frequency of Social Gatherings with Friends and Family: Never    Attends Religious Services: Not on Marketing executive or Organizations: No    Attends Banker Meetings: Never    Marital Status: Divorced    Allergies  Allergen Reactions   Lisinopril Cough    Outpatient Medications Prior to Visit  Medication Sig Dispense Refill   Accu-Chek Softclix Lancets lancets Use as instructed daily before meals 100 each 12   albuterol (PROVENTIL) (2.5 MG/3ML) 0.083% nebulizer solution Take 3 mLs by nebulization every 6 (six) hours as needed for wheezing or shortness of breath. 90 mL 3   Aspirin Effervescent (ALKA-SELTZER ORIGINAL PO) Take 1 packet by mouth 2 (two) times daily as needed (bloat).     atorvastatin (LIPITOR) 80 MG tablet Take 1 tablet (80 mg total) by mouth daily. 90 tablet 1   Blood Glucose Monitoring Suppl (ACCU-CHEK GUIDE) w/Device KIT Use as directed three times daily 1 kit 0   Budeson-Glycopyrrol-Formoterol (BREZTRI AEROSPHERE) 160-9-4.8 MCG/ACT AERO Take 2 puffs first thing in morning and then another 2 puffs about 12 hours later. 10.7 g 6    ergocalciferol (DRISDOL) 1.25 MG (50000 UT) capsule Take 1 capsule (50,000 Units total) by mouth once a week. 12 capsule 1   ezetimibe (ZETIA) 10 MG tablet Take 1 tablet (10 mg total) by mouth daily. 90 tablet 3   ferrous sulfate 325 (65 FE) MG tablet Take 1 tablet (325 mg total) by mouth daily with breakfast. 60 tablet 1   folic acid (FOLVITE) 1 MG tablet Take 1 tablet (1 mg total)  by mouth daily. 30 tablet 0   furosemide (LASIX) 40 MG tablet Take 1 tablet (40 mg total) by mouth 2 (two) times daily. 60 tablet 2   gabapentin (NEURONTIN) 300 MG capsule Take 1 capsule (300 mg total) by mouth 2 (two) times daily. 90 capsule 1   glucose blood (ACCU-CHEK GUIDE) test strip Use as instructed daily 100 each 12   hydrALAZINE (APRESOLINE) 50 MG tablet Take 1 tablet (50 mg total) by mouth every 8 (eight) hours. 270 tablet 1   isosorbide mononitrate (IMDUR) 30 MG 24 hr tablet Take 1 tablet (30 mg total) by mouth daily. 90 tablet 1   metoprolol succinate (TOPROL-XL) 50 MG 24 hr tablet Take 1 tablet (50 mg total) by mouth daily. Take with or immediately following a meal. 90 tablet 1   OXYGEN Inhale 2 L/min into the lungs as directed. On exertion or when walking     pantoprazole (PROTONIX) 40 MG tablet Take 1 tablet (40 mg total) by mouth daily. 30 tablet 0   rivaroxaban (XARELTO) 20 MG TABS tablet Take 1 tablet (20 mg total) by mouth daily with supper. 90 tablet 1   tiZANidine (ZANAFLEX) 2 MG tablet Take 1 tablet (2 mg total) by mouth at bedtime. 30 tablet 0   albuterol (VENTOLIN HFA) 108 (90 Base) MCG/ACT inhaler INHALE 2 PUFFS INTO THE LUNGS EVERY 6 (SIX) HOURS AS NEEDED FOR WHEEZING OR SHORTNESS OF BREATH. (Patient taking differently: Inhale 2 puffs into the lungs 3 (three) times daily as needed for wheezing or shortness of breath.) 18 g 5   nitroGLYCERIN (NITROSTAT) 0.4 MG SL tablet Place 1 tablet (0.4 mg total) under the tongue every 5 (five) minutes as needed for chest pain.     No facility-administered  medications prior to visit.     ROS Review of Systems  Constitutional:  Negative for activity change and appetite change.  HENT:  Negative for sinus pressure and sore throat.   Respiratory:  Negative for chest tightness, shortness of breath and wheezing.   Cardiovascular:  Negative for chest pain and palpitations.  Gastrointestinal:  Negative for abdominal distention, abdominal pain and constipation.  Genitourinary: Negative.   Musculoskeletal:  Positive for gait problem.       See HPI  Neurological:  Positive for tremors.  Psychiatric/Behavioral:  Negative for behavioral problems and dysphoric mood.     Objective:  BP 116/66   Pulse 79   Ht 6\' 1"  (1.854 m)   Wt 221 lb (100.2 kg)   SpO2 95%   BMI 29.16 kg/m      10/25/2022    9:28 AM 07/26/2022    8:42 AM 07/04/2022    9:44 AM  BP/Weight  Systolic BP 116 103 102  Diastolic BP 66 71 60  Wt. (Lbs) 221 210.4 212.8  BMI 29.16 kg/m2 27.76 kg/m2 26.6 kg/m2      Physical Exam Constitutional:      Appearance: He is well-developed.  Cardiovascular:     Rate and Rhythm: Normal rate.     Pulses:          Dorsalis pedis pulses are 0 on the right side and 0 on the left side.       Posterior tibial pulses are 0 on the right side and 0 on the left side.     Heart sounds: Normal heart sounds. No murmur heard. Pulmonary:     Effort: Pulmonary effort is normal.     Breath sounds: Normal breath sounds. No wheezing  or rales.  Chest:     Chest wall: No tenderness.  Abdominal:     General: Bowel sounds are normal. There is no distension.     Palpations: Abdomen is soft. There is no mass.     Tenderness: There is no abdominal tenderness.  Musculoskeletal:     Right lower leg: No edema.     Left lower leg: No edema.     Comments: Restricted range of motion of left knee with associated tenderness  Neurological:     Mental Status: He is alert and oriented to person, place, and time.  Psychiatric:        Mood and Affect: Mood  normal.    Diabetic Foot Exam - Simple   Simple Foot Form Diabetic Foot exam was performed with the following findings: Yes 10/25/2022 10:00 AM  Visual Inspection No deformities, no ulcerations, no other skin breakdown bilaterally: Yes See comments: Yes Sensation Testing Intact to touch and monofilament testing bilaterally: Yes Pulse Check See comments: Yes Comments Dark dystrophic nails bilaterally. No ulceration or skin breakdown Unable to palpate dorsalis pedis and posterior tibialis bilaterally ABI revealed bilateral PAD        Latest Ref Rng & Units 03/24/2022    1:09 AM 03/23/2022    1:57 AM 03/22/2022    5:51 AM  CMP  Glucose 70 - 99 mg/dL 161  096  045   BUN 8 - 23 mg/dL 14  5  8    Creatinine 0.61 - 1.24 mg/dL 4.09  8.11  9.14   Sodium 135 - 145 mmol/L 142  144  142   Potassium 3.5 - 5.1 mmol/L 3.6  4.2  3.9   Chloride 98 - 111 mmol/L 99  101  97   CO2 22 - 32 mmol/L 38  36  33   Calcium 8.9 - 10.3 mg/dL 8.8  9.2  9.4     Lipid Panel     Component Value Date/Time   CHOL 176 12/18/2021 1121   TRIG 167 (H) 12/18/2021 1121   HDL 58 12/18/2021 1121   CHOLHDL 2.9 01/29/2020 1020   CHOLHDL 2.8 01/11/2016 0446   VLDL 14 01/11/2016 0446   LDLCALC 89 12/18/2021 1121    CBC    Component Value Date/Time   WBC 8.8 07/26/2022 0947   WBC 7.5 06/07/2022 1111   RBC 4.25 07/26/2022 0947   RBC 4.13 (L) 06/07/2022 1111   HGB 12.3 (L) 07/26/2022 0947   HCT 40.7 07/26/2022 0947   PLT 266 07/26/2022 0947   MCV 96 07/26/2022 0947   MCH 28.9 07/26/2022 0947   MCH 22.2 (L) 03/24/2022 0109   MCHC 30.2 (L) 07/26/2022 0947   MCHC 31.6 06/07/2022 1111   RDW 12.3 07/26/2022 0947   LYMPHSABS 2.1 07/26/2022 0947   MONOABS 0.8 06/07/2022 1111   EOSABS 0.1 07/26/2022 0947   BASOSABS 0.1 07/26/2022 0947    Lab Results  Component Value Date   HGBA1C 5.0 10/25/2022    Assessment & Plan:  1. Type 2 diabetes mellitus with other specified complication, without long-term  current use of insulin (HCC) Diet controlled with A1c of 5.0 Counseled on Diabetic diet, my plate method, 782 minutes of moderate intensity exercise/week Blood sugar logs with fasting goals of 80-120 mg/dl, random of less than 956 and in the event of sugars less than 60 mg/dl or greater than 213 mg/dl encouraged to notify the clinic. Advised on the need for annual eye exams, annual foot exams, Pneumonia vaccine. -  POCT glycosylated hemoglobin (Hb A1C) - CMP14+EGFR - LP+Non-HDL Cholesterol  2. Unstable gait He does have some underlying neuropathy He would like to be evaluated for Parkinson's - Ambulatory referral to Neurology  3. Smoking greater than 20 pack years Smoking cessation support: smoking cessation hotline: 1-800-QUIT-NOW.  Smoking cessation classes are available through [facility]. Call [phone] or visit our website at [email or web address].  Spent 3 minutes counseling on dangers of tobacco use and benefits of quitting, offered pharmacological intervention to aid quitting and patient is not ready to quit.   4. Infertility male He is wondering why he has been unable to father a child since he returned from camp [place] - Ambulatory referral to Urology  5. Screening for prostate cancer - PSA, total and free  6. Acute gastric ulcer with hemorrhage With associated anemia Asymptomatic Continue PPI - CBC with Differential/Platelet  7. Neuropathy Uncontrolled He remains on gabapentin - Vitamin B12  8. Atherosclerosis of aorta (HCC) Continue with statin Low-cholesterol diet  9. Atrial flutter, unspecified type (HCC) Currently in sinus rhythm Continue Xarelto, metoprolol  10. Chronic pain of left knee Status post cortisone injection Under the care of orthopedic  11. Peripheral vascular disease (HCC) New diagnosis Discussed pathophysiology of peripheral vascular disease Advised that smoking cessation will be beneficial - POCT ABI  Screening for Pilot No Charge - Ambulatory referral to Vascular Surgery  12. Pruritus Pruritus of feet - terbinafine (LAMISIL AT ATHLETES FOOT) 1 % cream; Apply 1 Application topically 2 (two) times daily.  Dispense: 42 g; Refill: 1    Meds ordered this encounter  Medications   terbinafine (LAMISIL AT ATHLETES FOOT) 1 % cream    Sig: Apply 1 Application topically 2 (two) times daily.    Dispense:  42 g    Refill:  1    Follow-up: Return in about 3 months (around 01/25/2023) for Chronic medical conditions.       [name], MD, FAAFP. [facility] [place], [place] [phone]   10/25/2022, 10:20 AM

## 2022-10-25 NOTE — Patient Instructions (Signed)
Peripheral Vascular Disease  Peripheral vascular disease (PVD) is a disease of the blood vessels. PVD may also be called peripheral artery disease (PAD) or poor circulation. PVD is the blocking or hardening of the arteries anywhere within the circulatory system beyond the heart. This can result in a decreased supply of blood to the arms, legs, and internal organs, such as the stomach or kidneys. However, PVD most often affects a person's lower legs and feet. Without treatment, PVD often worsens. PVD can lead to acute limb ischemia. This occurs when an arm or leg suddenly has trouble getting enough blood. This is a medical emergency. What are the causes? The most common cause of PVD is atherosclerosis. This is a buildup of fatty material and other substances (plaque)inside your arteries. Pieces of plaque can break off from the walls of an artery and become stuck in a smaller artery, blocking blood flow and possibly causing acute limb ischemia. Other common causes of PVD include: Blood clots that form inside the blood vessels. Injuries to blood vessels. Diseases that cause inflammation of blood vessels or cause blood vessel tightening (spasms). What increases the risk? The following factors may make you more likely to develop this condition: A family history of PVD. Common medical conditions, including: High cholesterol. Diabetes. High blood pressure (hypertension). Heart disease. Known atherosclerotic disease in another area of the body. Past injury, such as burns or a broken bone. Other medical conditions, such as: Buerger's disease. This is caused by inflamed blood vessels in your hands and feet. Some forms of arthritis. Birth defects that affect the arteries in your legs. Kidney disease. Using tobacco and nicotine products. Not getting enough exercise. Obesity. Being age 65 or older, or being age 50 or older and having the other risk factors. What are the signs or symptoms? This  condition may cause different symptoms. Your symptoms depend on what body part is not getting enough blood. Common signs and symptoms include: Cramps in your buttocks, legs, and feet. Intermittent claudication. This is pain and weakness in your legs during activity that resolves with rest. Leg pain at rest and leg numbness, tingling, or weakness. Coldness in a leg or foot, especially when compared to the other leg or foot. Skin or hair changes. These can include: Hair loss. Shiny skin. Pale or bluish skin. Thick toenails. Inability to get or maintain an erection (erectile dysfunction). Tiredness (fatigue). Weak pulse or no pulse in the feet. People with PVD are more likely to develop open wounds (ulcers) and sores on their toes, feet, or legs. The ulcers or sores may take longer than normal to heal. How is this diagnosed? PVD is diagnosed based on your signs and symptoms, a physical exam, and your medical history. You may also have other tests to find the cause. Tests include: Ankle-brachial index test.This test compares the blood pressure readings of the legs and arms. This may also include an exercise ankle-brachial index test in which you walk on a treadmill to check your symptoms. Doppler ultrasound. This takes pictures of blood flow through your blood vessels. Imaging studies that use dye to show blood flow. These are: CT angiogram. Magnetic resonance angiogram, or MRA. How is this treated? Treatment for PVD depends on the cause of your condition, how severe your symptoms are, and your age. Underlying causes need to be treated and controlled. These include long-term (chronic) conditions, such as diabetes, high cholesterol, and hypertension. Treatment may include: Lifestyle changes, such as: Quitting tobacco use. Exercising regularly. Following a   low-fat, low-cholesterol diet. Not drinking alcohol. Taking medicines, such as: Blood thinners to prevent blood clots. Medicines to  improve blood flow. Medicines to improve cholesterol levels. Procedures, such as: Angioplasty. This uses an inflated balloon to open a blocked artery and improve blood flow. Stent implant. This inserts a small mesh tube to keep a blocked artery open. Peripheral bypass surgery. This reroutes blood flow around a blocked artery. Surgery to remove dead tissue from an infected wound (debridement). Amputation. This is surgical removal of the affected limb. It may be necessary in cases of acute limb ischemia when medical or surgical treatments have not helped. Follow these instructions at home: Medicines Take over-the-counter and prescription medicines only as told by your health care provider. If you are taking blood thinners: Talk with your health care provider before you take any medicines that contain aspirin or NSAIDs, such as ibuprofen. These medicines increase your risk for dangerous bleeding. Take your medicine exactly as told, at the same time every day. Avoid activities that could cause injury or bruising, and follow instructions about how to prevent falls. Wear a medical alert bracelet or carry a card that lists what medicines you take. Lifestyle     Exercise regularly. Ask your health care provider about some good activities for you. Talk with your health care provider about maintaining a healthy weight. If needed, ask about losing weight. Eat a diet that is low in fat and cholesterol. If you need help, talk with your health care provider. Do not drink alcohol. Do not use any products that contain nicotine or tobacco. These products include cigarettes, chewing tobacco, and vaping devices, such as e-cigarettes. If you need help quitting, ask your health care provider. General instructions Take good care of your feet. To do this: Wear comfortable shoes that fit well. Check your feet often for any cuts or sores. Get an annual influenza vaccine. Keep all follow-up visits. This is  important. Where to find more information Society for Vascular Surgery: vascular.org American Heart Association: heart.org National Heart, Lung, and Blood Institute: nhlbi.nih.gov Contact a health care provider if: You have leg cramps while walking. You have leg pain when you rest. Your leg or foot feels cold. Your skin changes color. You have erectile dysfunction. You have cuts or sores on your legs or feet that do not heal. Get help right away if: You have sudden changes in color and feeling of your arms or legs, such as: Your arm or leg turns cold, numb, and blue. Your arm or leg becomes red, warm, swollen, painful, or numb. You have any symptoms of a stroke. "BE FAST" is an easy way to remember the main warning signs of a stroke: B - Balance. Signs are dizziness, sudden trouble walking, or loss of balance. E - Eyes. Signs are trouble seeing or a sudden change in vision. F - Face. Signs are sudden weakness or numbness of the face, or the face or eyelid drooping on one side. A - Arms. Signs are weakness or numbness in an arm. This happens suddenly and usually on one side of the body. S - Speech. Signs are sudden trouble speaking, slurred speech, or trouble understanding what people say. T - Time. Time to call emergency services. Write down what time symptoms started. You have other signs of a stroke, such as: A sudden, severe headache with no known cause. Nausea or vomiting. Seizure. You have chest pain or trouble breathing. These symptoms may represent a serious problem that is an emergency.   Do not wait to see if the symptoms will go away. Get medical help right away. Call your local emergency services (911 in the [place] Do not drive yourself to the hospital. Summary Peripheral vascular disease (PVD) is a disease of the blood vessels. PVD is the blocking or hardening of the arteries anywhere within the circulatory system beyond the heart. PVD may cause different symptoms. Your  symptoms depend on what part of your body is not getting enough blood. Treatment for PVD depends on what caused it, how severe your symptoms are, and your age. This information is not intended to replace advice given to you by your health care provider. Make sure you discuss any questions you have with your health care provider. Document Revised: 11/30/2019 Document Reviewed: 11/30/2019 [employer] Patient Education  2023 [employer]  

## 2022-10-26 ENCOUNTER — Other Ambulatory Visit: Payer: Self-pay

## 2022-10-26 ENCOUNTER — Other Ambulatory Visit: Payer: Self-pay | Admitting: *Deleted

## 2022-10-26 DIAGNOSIS — I739 Peripheral vascular disease, unspecified: Secondary | ICD-10-CM

## 2022-10-26 LAB — CMP14+EGFR
ALT: 26 IU/L (ref 0–44)
AST: 29 IU/L (ref 0–40)
Albumin/Globulin Ratio: 1.9 (ref 1.2–2.2)
Albumin: 4.4 g/dL (ref 3.9–4.9)
Alkaline Phosphatase: 73 IU/L (ref 44–121)
BUN/Creatinine Ratio: 14 (ref 10–24)
BUN: 14 mg/dL (ref 8–27)
Bilirubin Total: 0.3 mg/dL (ref 0.0–1.2)
CO2: 29 mmol/L (ref 20–29)
Calcium: 9.8 mg/dL (ref 8.6–10.2)
Chloride: 96 mmol/L (ref 96–106)
Creatinine, Ser: 0.97 mg/dL (ref 0.76–1.27)
Globulin, Total: 2.3 g/dL (ref 1.5–4.5)
Glucose: 110 mg/dL — ABNORMAL HIGH (ref 70–99)
Potassium: 4.9 mmol/L (ref 3.5–5.2)
Sodium: 142 mmol/L (ref 134–144)
Total Protein: 6.7 g/dL (ref 6.0–8.5)
eGFR: 85 mL/min/{1.73_m2} (ref >59)

## 2022-10-26 LAB — CBC WITH DIFFERENTIAL/PLATELET
Basophils Absolute: 0.1 10*3/uL (ref 0.0–0.2)
Basos: 1 %
EOS (ABSOLUTE): 0.1 10*3/uL (ref 0.0–0.4)
Eos: 2 %
Hematocrit: 38.2 % (ref 37.5–51.0)
Hemoglobin: 11.5 g/dL — ABNORMAL LOW (ref 13.0–17.7)
Immature Grans (Abs): 0 10*3/uL (ref 0.0–0.1)
Immature Granulocytes: 0 %
Lymphocytes Absolute: 1.7 10*3/uL (ref 0.7–3.1)
Lymphs: 25 %
MCH: 27 pg (ref 26.6–33.0)
MCHC: 30.1 g/dL — ABNORMAL LOW (ref 31.5–35.7)
MCV: 90 fL (ref 79–97)
Monocytes Absolute: 0.6 10*3/uL (ref 0.1–0.9)
Monocytes: 9 %
Neutrophils Absolute: 4.4 10*3/uL (ref 1.4–7.0)
Neutrophils: 63 %
Platelets: 323 10*3/uL (ref 150–450)
RBC: 4.26 x10E6/uL (ref 4.14–5.80)
RDW: 14.1 % (ref 11.6–15.4)
WBC: 6.9 10*3/uL (ref 3.4–10.8)

## 2022-10-26 LAB — LP+NON-HDL CHOLESTEROL
Cholesterol, Total: 127 mg/dL (ref 100–199)
HDL: 49 mg/dL (ref >39)
LDL Chol Calc (NIH): 62 mg/dL (ref 0–99)
Total Non-HDL-Chol (LDL+VLDL): 78 mg/dL (ref 0–129)
Triglycerides: 80 mg/dL (ref 0–149)
VLDL Cholesterol Cal: 16 mg/dL (ref 5–40)

## 2022-10-26 LAB — PSA, TOTAL AND FREE
PSA, Free Pct: 72.9 %
PSA, Free: 0.51 ng/mL
Prostate Specific Ag, Serum: 0.7 ng/mL (ref 0.0–4.0)

## 2022-10-26 LAB — VITAMIN B12: Vitamin B-12: 946 pg/mL (ref 232–1245)

## 2022-10-28 ENCOUNTER — Ambulatory Visit
Admission: RE | Admit: 2022-10-28 | Discharge: 2022-10-28 | Disposition: A | Discharge status: Home / Self Care | Payer: Medicare HMO | Source: Ambulatory Visit | Attending: Orthopaedic Surgery | Admitting: Orthopaedic Surgery

## 2022-10-28 DIAGNOSIS — M5410 Radiculopathy, site unspecified: Secondary | ICD-10-CM

## 2022-10-28 DIAGNOSIS — M5136 Other intervertebral disc degeneration, lumbar region: Secondary | ICD-10-CM | POA: Diagnosis not present

## 2022-11-01 ENCOUNTER — Ambulatory Visit (HOSPITAL_COMMUNITY)
Admission: RE | Admit: 2022-11-01 | Discharge: 2022-11-01 | Disposition: A | Discharge status: Home / Self Care | Payer: Medicare HMO | Source: Ambulatory Visit | Attending: Vascular Surgery | Admitting: Vascular Surgery

## 2022-11-01 DIAGNOSIS — I739 Peripheral vascular disease, unspecified: Secondary | ICD-10-CM

## 2022-11-01 LAB — VAS US ABI WITH/WO TBI
Left ABI: 0.56
Right ABI: 0.73

## 2022-11-06 ENCOUNTER — Other Ambulatory Visit: Payer: Self-pay | Admitting: Family Medicine

## 2022-11-06 ENCOUNTER — Other Ambulatory Visit: Payer: Self-pay

## 2022-11-06 DIAGNOSIS — J449 Chronic obstructive pulmonary disease, unspecified: Secondary | ICD-10-CM

## 2022-11-07 ENCOUNTER — Ambulatory Visit (INDEPENDENT_AMBULATORY_CARE_PROVIDER_SITE_OTHER): Payer: Medicare HMO | Admitting: Orthopaedic Surgery

## 2022-11-07 ENCOUNTER — Encounter: Payer: Medicare HMO | Admitting: Vascular Surgery

## 2022-11-07 ENCOUNTER — Other Ambulatory Visit: Payer: Self-pay

## 2022-11-07 ENCOUNTER — Encounter: Payer: Self-pay | Admitting: Orthopaedic Surgery

## 2022-11-07 DIAGNOSIS — M5416 Radiculopathy, lumbar region: Secondary | ICD-10-CM

## 2022-11-07 MED ORDER — ALBUTEROL SULFATE HFA 108 (90 BASE) MCG/ACT IN AERS
2.0000 | INHALATION_SPRAY | Freq: Four times a day (QID) | RESPIRATORY_TRACT | 0 refills | Status: DC | PRN
Start: 2022-11-07 — End: 2022-12-06
  Filled 2022-11-07: qty 54, 75d supply, fill #0

## 2022-11-07 NOTE — Progress Notes (Signed)
HPI: Mr. Waye returns today to go over the MRI of his lumbar spine.  He continues to have pain low back but also has radicular symptoms lateral aspect of the left knee down to the ankle.  Is worse with walking extension.  He ranks his pain to be 7 out of 10 pain at worst.  Denies any bowel or bladder dysfunction fevers, chills.  He does have some waking pain.  States he has minimal pain in the right leg but nothing like the left leg.  Patient is diabetic but reports good control of his diabetes.  MRI lumbar spine is reviewed with the patient images reviewed also model of the lumbar spine is used to demonstrate the anatomy.  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  Moderate left L4 foraminal stenosis.  Chronic severe degenerative disc disease at L5-S1.  Physical exam:  General well-developed well-nourished male who ambulates with a rollator.   Impression: Lumbar radiculopathy left leg   Plan: Will send him for an epidural steroid and lumbar spine with Dr. Alvester Morin in the near future.  Will see him back to 3 weeks later see what type of response he had.  Questions were encouraged and answered at length.

## 2022-11-08 ENCOUNTER — Other Ambulatory Visit: Payer: Self-pay

## 2022-11-09 ENCOUNTER — Other Ambulatory Visit: Payer: Self-pay

## 2022-11-09 DIAGNOSIS — M5416 Radiculopathy, lumbar region: Secondary | ICD-10-CM

## 2022-11-09 DIAGNOSIS — M5410 Radiculopathy, site unspecified: Secondary | ICD-10-CM

## 2022-11-15 ENCOUNTER — Telehealth: Payer: Self-pay | Admitting: Physical Medicine and Rehabilitation

## 2022-11-15 NOTE — Telephone Encounter (Signed)
Patient called to schedule an appointment with Dr. Alvester Morin for his back. The number to contact patient is [phone]

## 2022-11-19 ENCOUNTER — Telehealth: Payer: Self-pay | Admitting: Physical Medicine and Rehabilitation

## 2022-11-19 NOTE — Telephone Encounter (Signed)
Patient called. Would like an appointment with Dr. Newton 

## 2022-11-19 NOTE — Telephone Encounter (Signed)
See previous encounter

## 2022-11-19 NOTE — Telephone Encounter (Signed)
Spoke with patient and scheduled injection for 11/27/22. Patient aware driver needed

## 2022-11-27 ENCOUNTER — Ambulatory Visit (INDEPENDENT_AMBULATORY_CARE_PROVIDER_SITE_OTHER): Payer: Medicare HMO | Admitting: Physical Medicine and Rehabilitation

## 2022-11-27 ENCOUNTER — Other Ambulatory Visit: Payer: Self-pay

## 2022-11-27 VITALS — BP 112/63 | HR 111

## 2022-11-27 DIAGNOSIS — M5416 Radiculopathy, lumbar region: Secondary | ICD-10-CM

## 2022-11-27 DIAGNOSIS — M48061 Spinal stenosis, lumbar region without neurogenic claudication: Secondary | ICD-10-CM

## 2022-11-27 MED ORDER — METHYLPREDNISOLONE ACETATE 80 MG/ML IJ SUSP
80.0000 mg | Freq: Once | INTRAMUSCULAR | Status: AC
Start: 2022-11-27 — End: 2022-11-27
  Administered 2022-11-27: 80 mg

## 2022-11-27 NOTE — Progress Notes (Signed)
Functional Pain Scale - descriptive words and definitions  Unmanageable (7)  Pain interferes with normal ADL's/nothing seems to help/sleep is very difficult/active distractions are very difficult to concentrate on. Severe range order  Average Pain 8-9   +Driver, -BT, -Dye Allergies.  Lower back pain on left side that radiates into left leg to the foot

## 2022-11-27 NOTE — Patient Instructions (Signed)

## 2022-11-30 DIAGNOSIS — Z3141 Encounter for fertility testing: Secondary | ICD-10-CM | POA: Diagnosis not present

## 2022-12-04 ENCOUNTER — Encounter: Payer: Self-pay | Admitting: Vascular Surgery

## 2022-12-04 ENCOUNTER — Ambulatory Visit (INDEPENDENT_AMBULATORY_CARE_PROVIDER_SITE_OTHER): Payer: Medicare HMO | Admitting: Vascular Surgery

## 2022-12-04 ENCOUNTER — Other Ambulatory Visit: Payer: Self-pay

## 2022-12-04 VITALS — BP 117/69 | HR 56 | Temp 98.8°F | Resp 20 | Ht 73.0 in | Wt 221.0 lb

## 2022-12-04 DIAGNOSIS — I70222 Atherosclerosis of native arteries of extremities with rest pain, left leg: Secondary | ICD-10-CM

## 2022-12-04 DIAGNOSIS — I70229 Atherosclerosis of native arteries of extremities with rest pain, unspecified extremity: Secondary | ICD-10-CM

## 2022-12-04 NOTE — Progress Notes (Signed)
[facility] [place]   ASSESSMENT / PLAN: Jared [name] is a 67 y.o. male with atherosclerosis of native arteries of left leg causing ischemic rest pain.   Recommend:  Abstinence from all tobacco products. Blood glucose control with goal A1c < 7%. Blood pressure control with goal blood pressure < 140/90 mmHg. Lipid reduction therapy with goal LDL-C <70 mg/dL. Aspirin 81mg PO QD.  Atorvastatin 40-80mg PO QD (or other "high intensity" statin therapy).   The patient is on best medical therapy for peripheral arterial disease. The patient has been counseled about the risks of tobacco use in atherosclerotic disease. The patient has been counseled to abstain from any tobacco use. An aortogram with bilateral lower extremity runoff angiography and Left lower extremity intervention and is indicated to better evaluate the patient's lower extremity circulation because of the limb threatening nature of the patient's diagnosis. Based on the patient's clinical exam and non-invasive data, we anticipate an endovascular intervention in the femoropopliteal vessels. Stenting would be favored because of the improved primary patency of these interventions as compared to plain balloon angioplasty.   CHIEF COMPLAINT: left leg pain   HISTORY OF PRESENT ILLNESS: Jared [name] is a 67 y.o. male referred to clinic for evaluation of left leg pain and noninvasive management suggestive of peripheral arterial disease.  The patient is a longtime smoker.  He is dependent on continuous oxygen therapy for dyspnea associated with COPD.  He is an active smoker.  He reports smoking about half pack a day.  The patient reports left leg pain which is constant and occasionally wakes him from sleep.  The pain is throughout his left calf and into the foot.  The pain is significantly worsened by walking.  He is only able to walk a few feet before the pain becomes disabling.  He has no ulcers about his feet.          Past Medical History:  Diagnosis Date   AVM (arteriovenous malformation)     CAD (coronary artery disease)      a. LHC 5/12:  LAD 20, pLCx 20, pRCA 40, dRCA 40, EF 35%, diff HK  //  b. Myoview 4/16: Overall Impression:  High risk stress nuclear study There is no evidence of ischemia.  There is severe LV dysfunction. LV Ejection Fraction: 30%.  LV Wall Motion:  There is global LV hypokinesis.     CAP (community acquired pneumonia) 09/2013   Chronic combined systolic and diastolic CHF (congestive heart failure) (HCC)      a. Echo 4/16:Mild LVH, EF 40-45%, diffuse HK //  b. Echo 8/17: EF 35-40%, diffuse HK, diastolic dysfunction, aortic sclerosis, trivial MR, moderate LAE, normal RVSF, moderate RAE, mild TR, PASP 42 mmHg // c. Echo 4/18: Mild concentric LVH, EF 30-35, normal wall motion, grade 1 diastolic dysfunction, PASP 49   Chronic respiratory failure (HCC)     Cluster headache      "hx; haven't had one in awhile" (01/09/2016)   COPD (chronic obstructive pulmonary disease) (HCC)      /notes 01/09/2016   DM (diabetes mellitus) (HCC)     History of CVA (cerebrovascular accident)     Hypertension     IDA (iron deficiency anemia)     Moderate tobacco use disorder     NICM (nonischemic cardiomyopathy) (HCC)     Nicotine addiction     Tobacco abuse     Type 2 diabetes mellitus (HCC) 05/14/2016             Past Surgical History:  Procedure Laterality Date   BIOPSY   11/12/2020    Procedure: BIOPSY;  Surgeon: Mansouraty, Gabriel Jr., MD;  Location: [facility];  Service: Gastroenterology;;   CARDIAC CATHETERIZATION   10/2010    LM normal, LAD with 20% irregularities, LCX with 20%, RCA with 40% prox and 40% distal - EF of 35%   CATARACT EXTRACTION, BILATERAL       COLONOSCOPY W/ BIOPSIES AND POLYPECTOMY       COLONOSCOPY WITH PROPOFOL N/A 09/06/2018    Procedure: COLONOSCOPY WITH PROPOFOL;  Surgeon: Beavers, Kimberly, MD;  Location: [facility];  Service: Gastroenterology;  Laterality: N/A;    ENTEROSCOPY N/A 09/28/2018    Procedure: ENTEROSCOPY;  Surgeon: Hung, Patrick, MD;  Location: [facility];  Service: Endoscopy;  Laterality: N/A;   ENTEROSCOPY N/A 10/28/2018    Procedure: ENTEROSCOPY;  Surgeon: Beavers, Kimberly, MD;  Location: [facility];  Service: Gastroenterology;  Laterality: N/A;   ENTEROSCOPY N/A 10/09/2020    Procedure: ENTEROSCOPY;  Surgeon: Perry, John N, MD;  Location: [facility];  Service: Endoscopy;  Laterality: N/A;   ENTEROSCOPY N/A 03/22/2022    Procedure: ENTEROSCOPY;  Surgeon: Armbruster, Steven P, MD;  Location: [facility];  Service: Gastroenterology;  Laterality: N/A;   ESOPHAGOGASTRODUODENOSCOPY N/A 11/12/2020    Procedure: ESOPHAGOGASTRODUODENOSCOPY (EGD);  Surgeon: Mansouraty, Gabriel Jr., MD;  Location: [facility];  Service: Gastroenterology;  Laterality: N/A;   ESOPHAGOGASTRODUODENOSCOPY (EGD) WITH PROPOFOL N/A 09/05/2018    Procedure: ESOPHAGOGASTRODUODENOSCOPY (EGD) WITH PROPOFOL;  Surgeon: Armbruster, Steven P, MD;  [facility];  Service: Gastroenterology;  Laterality: N/A;   ESOPHAGOGASTRODUODENOSCOPY (EGD) WITH PROPOFOL N/A 11/19/2020    Procedure: ESOPHAGOGASTRODUODENOSCOPY (EGD) WITH PROPOFOL;  Surgeon: Pyrtle, Jay M, MD;  Location: [facility];  Service: Gastroenterology;  Laterality: N/A;   EXCISION MASS HEAD       GIVENS CAPSULE STUDY N/A 09/06/2018    Procedure: GIVENS CAPSULE STUDY;  Surgeon: Beavers, Kimberly, MD;  [facility]: [facility];  Service: Gastroenterology;  Laterality: N/A;   GIVENS CAPSULE STUDY N/A 09/26/2018    Procedure: GIVENS CAPSULE STUDY;  Surgeon: Pyrtle, Jay M, MD;  [facility];  Service: Gastroenterology;  Laterality: N/A;   GIVENS CAPSULE STUDY N/A 06/14/2019    Procedure: GIVENS CAPSULE STUDY;  Surgeon: Nandigam, Kavitha V, MD;  [facility]: [facility];  Service: Endoscopy;  Laterality: N/A;   HEMOSTASIS CLIP PLACEMENT   11/12/2020    Procedure: HEMOSTASIS CLIP PLACEMENT;  Surgeon: Mansouraty, Gabriel  Jr., MD;  Location: [facility];  Service: Gastroenterology;;   HEMOSTASIS CONTROL   11/12/2020    Procedure: HEMOSTASIS CONTROL;  Surgeon: Mansouraty, Gabriel Jr., MD;  Location: [facility];  Service: Gastroenterology;;   HOT HEMOSTASIS N/A 10/28/2018    Procedure: HOT HEMOSTASIS (ARGON PLASMA COAGULATION/BICAP);  Surgeon: Beavers, Kimberly, MD;  [facility];  Service: Gastroenterology;  Laterality: N/A;   HOT HEMOSTASIS N/A 11/12/2020    Procedure: HOT HEMOSTASIS (ARGON PLASMA COAGULATION/BICAP);  Surgeon: Mansouraty, Gabriel Jr., MD;  Location: [facility];  Service: Gastroenterology;  Laterality: N/A;   HOT HEMOSTASIS N/A 03/22/2022    Procedure: HOT HEMOSTASIS (ARGON PLASMA COAGULATION/BICAP);  Surgeon: Armbruster, Steven P, MD;  [facility]: [facility];  Service: Gastroenterology;  Laterality: N/A;   INCISION AND DRAINAGE PERIRECTAL ABSCESS N/A 06/05/2017    Procedure: IRRIGATION AND DEBRIDEMENT PERIRECTAL ABSCESS;  Surgeon: White, Christopher M, MD;  Location: [facility];  Service: General;  Laterality: N/A;   SUBMUCOSAL TATTOO INJECTION   11/12/2020    Procedure: SUBMUCOSAL TATTOO INJECTION;  Surgeon:   Mansouraty, Gabriel Jr., MD;  Location: [facility];  Service: Gastroenterology;;   VIDEO BRONCHOSCOPY Bilateral 05/08/2016    Procedure: VIDEO BRONCHOSCOPY WITH FLUORO;  Surgeon: Rakesh V Alva, MD;  Location: [facility];  Service: Cardiopulmonary;  Laterality: Bilateral;           Family History  Problem Relation Age of Onset   Heart disease Mother     Diabetes Mother     Colon cancer Mother     Liver cancer Mother     Cancer Father          type unknown   Diabetes Sister          x 2   Diabetes Brother        Social History         Socioeconomic History   Marital status: Single      Spouse name: Not on file   Number of children: 1   Years of education: Not on file   Highest education level: Not on file  Occupational History   Occupation: retired  Tobacco Use    Smoking status: Every Day      Packs/day: 0.50      Years: 47.00      Additional pack years: 0.00      Total pack years: 23.50      Types: Cigarettes   Smokeless tobacco: Never   Tobacco comments:      4/5 cigs  Vaping Use   Vaping Use: Never used  Substance and Sexual Activity   Alcohol use: Not Currently      Alcohol/week: 0.0 standard drinks of alcohol      Comment: last drink was before xmas   Drug use: No      Types: Cocaine, Marijuana      Comment: "nothing in 20 years"   Sexual activity: Not Currently  Other Topics Concern   Not on file  Social History Narrative    unemployed    Social Determinants of Health        Financial Resource Strain: Low Risk  (05/25/2022)    Overall Financial Resource Strain (CARDIA)     Difficulty of Paying Living Expenses: Not hard at all  Food Insecurity: No Food Insecurity (05/25/2022)    Hunger Vital Sign     Worried About Running Out of Food in the Last Year: Never true     Ran Out of Food in the Last Year: Never true  Transportation Needs: No Transportation Needs (05/25/2022)    PRAPARE - Transportation     Lack of Transportation (Medical): No     Lack of Transportation (Non-Medical): No  Physical Activity: Inactive (05/25/2022)    Exercise Vital Sign     Days of Exercise per Week: 0 days     Minutes of Exercise per Session: 0 min  Stress: No Stress Concern Present (05/25/2022)    [employer] - Occupational Stress Questionnaire     Feeling of Stress : Not at all  Social Connections: Unknown (05/25/2022)    Social Connection and Isolation Panel [NHANES]     Frequency of Communication with Friends and Family: Three times a week     Frequency of Social Gatherings with Friends and Family: Never     Attends Religious Services: Not on file     Active Member of Clubs or Organizations: No     Attends Club or Organization Meetings: Never     Marital Status: Divorced  Intimate Partner Violence: Not   At Risk  (01/06/2021)    Humiliation, Afraid, Rape, and Kick questionnaire     Fear of Current or Ex-Partner: No     Emotionally Abused: No     Physically Abused: No     Sexually Abused: No          Allergies  Allergen Reactions   Lisinopril Cough            Current Outpatient Medications  Medication Sig Dispense Refill   Accu-Chek Softclix Lancets lancets Use as instructed daily before meals 100 each 12   albuterol (PROVENTIL) (2.5 MG/3ML) 0.083% nebulizer solution Take 3 mLs by nebulization every 6 (six) hours as needed for wheezing or shortness of breath. 90 mL 3   albuterol (VENTOLIN HFA) 108 (90 Base) MCG/ACT inhaler INHALE 2 PUFFS INTO THE LUNGS EVERY 6 (SIX) HOURS AS NEEDED FOR WHEEZING OR SHORTNESS OF BREATH. 54 g 0   Aspirin Effervescent (ALKA-SELTZER ORIGINAL PO) Take 1 packet by mouth 2 (two) times daily as needed (bloat).       atorvastatin (LIPITOR) 80 MG tablet Take 1 tablet (80 mg total) by mouth daily. 90 tablet 1   Blood Glucose Monitoring Suppl (ACCU-CHEK GUIDE) w/Device KIT Use as directed three times daily 1 kit 0   Budeson-Glycopyrrol-Formoterol (BREZTRI AEROSPHERE) 160-9-4.8 MCG/ACT AERO Take 2 puffs first thing in morning and then another 2 puffs about 12 hours later. 10.7 g 6   ergocalciferol (DRISDOL) 1.25 MG (50000 UT) capsule Take 1 capsule (50,000 Units total) by mouth once a week. 12 capsule 1   ezetimibe (ZETIA) 10 MG tablet Take 1 tablet (10 mg total) by mouth daily. 90 tablet 3   ferrous sulfate 325 (65 FE) MG tablet Take 1 tablet (325 mg total) by mouth daily with breakfast. 60 tablet 1   folic acid (FOLVITE) 1 MG tablet Take 1 tablet (1 mg total) by mouth daily. 30 tablet 0   furosemide (LASIX) 40 MG tablet Take 1 tablet (40 mg total) by mouth 2 (two) times daily. 180 tablet 1   gabapentin (NEURONTIN) 300 MG capsule Take 1 capsule (300 mg total) by mouth 2 (two) times daily. 90 capsule 1   glucose blood (ACCU-CHEK GUIDE) test strip Use as instructed daily 100  each 12   hydrALAZINE (APRESOLINE) 50 MG tablet Take 1 tablet (50 mg total) by mouth every 8 (eight) hours. 270 tablet 1   isosorbide mononitrate (IMDUR) 30 MG 24 hr tablet Take 1 tablet (30 mg total) by mouth daily. 90 tablet 1   metoprolol succinate (TOPROL-XL) 50 MG 24 hr tablet Take 1 tablet (50 mg total) by mouth daily. Take with or immediately following a meal. 90 tablet 1   OXYGEN Inhale 2 L/min into the lungs as directed. On exertion or when walking       pantoprazole (PROTONIX) 40 MG tablet Take 1 tablet (40 mg total) by mouth daily. 90 tablet 1   rivaroxaban (XARELTO) 20 MG TABS tablet Take 1 tablet (20 mg total) by mouth daily with supper. 90 tablet 1   terbinafine (LAMISIL AT ATHLETES FOOT) 1 % cream Apply 1 Application topically 2 (two) times daily. 42 g 1   tiZANidine (ZANAFLEX) 2 MG tablet Take 1 tablet (2 mg total) by mouth at bedtime. 30 tablet 0   nitroGLYCERIN (NITROSTAT) 0.4 MG SL tablet Place 1 tablet (0.4 mg total) under the tongue every 5 (five) minutes as needed for chest pain.                   Current Facility-Administered Medications  Medication Dose Route Frequency Provider Last Rate Last Admin   methylPREDNISolone acetate (DEPO-MEDROL) injection 80 mg  80 mg Other Once Newton, Frederic, MD          PHYSICAL EXAM    Vitals:    12/04/22 1411  BP: 117/69  Pulse: (!) 56  Resp: 20  Temp: 98.8 F (37.1 C)  SpO2: 96%  Weight: 221 lb (100.2 kg)  Height: 6' 1" (1.854 m)    Chronically ill-appearing gentleman in no acute distress Regular rate and rhythm On home oxygen via continuous nasal cannula 2+ femoral pulses bilaterally No palpable pedal pulses       PERTINENT LABORATORY AND RADIOLOGIC DATA   Most recent CBC     Latest Ref Rng & Units 10/25/2022   10:27 AM 07/26/2022    9:47 AM 06/07/2022   11:11 AM  CBC  WBC 3.4 - 10.8 x10E3/uL 6.9  8.8  7.5   Hemoglobin 13.0 - 17.7 g/dL 11.5  12.3  11.7   Hematocrit 37.5 - 51.0 % 38.2  40.7  37.1   Platelets  150 - 450 x10E3/uL 323  266  233.0       Most recent CMP     Latest Ref Rng & Units 10/25/2022   10:27 AM 03/24/2022    1:09 AM 03/23/2022    1:57 AM  CMP  Glucose 70 - 99 mg/dL 110  139  127   BUN 8 - 27 mg/dL 14  14  5   Creatinine 0.76 - 1.27 mg/dL 0.97  1.14  0.87   Sodium 134 - 144 mmol/L 142  142  144   Potassium 3.5 - 5.2 mmol/L 4.9  3.6  4.2   Chloride 96 - 106 mmol/L 96  99  101   CO2 20 - 29 mmol/L 29  38  36   Calcium 8.6 - 10.2 mg/dL 9.8  8.8  9.2   Total Protein 6.0 - 8.5 g/dL 6.7       Total Bilirubin 0.0 - 1.2 mg/dL 0.3       Alkaline Phos 44 - 121 IU/L 73       AST 0 - 40 IU/L 29       ALT 0 - 44 IU/L 26         +-------+-----------+-----------+------------+------------+  ABI/TBIToday's ABIToday's TBIPrevious ABIPrevious TBI  +-------+-----------+-----------+------------+------------+  Right 0.73       0.58                                 +-------+-----------+-----------+------------+------------+  Left  0.56       0                                    +-------+-----------+-----------+------------+------------+    Jared Circle N. Kendalynn Wideman, MD FACS [facility] [place] Office Phone Number: [phone] 12/04/2022 2:36 PM     Total time spent on preparing this encounter including chart review, data review, collecting history, examining the patient, coordinating care for this new patient, 60 minutes.   Portions of this report may have been transcribed using voice recognition software.  Every effort has been made to ensure accuracy; however, inadvertent computerized transcription errors may still be present. 

## 2022-12-05 NOTE — Procedures (Signed)
Lumbosacral Transforaminal Epidural Steroid Injection - Sub-Pedicular Approach with Fluoroscopic Guidance  Patient: Jared [name]      Date of Birth: 02/09/1956 MRN: [id number] PCP: [name], MD      Visit Date: 11/27/2022   Universal Protocol:    Date/Time: 11/27/2022  Consent Given By: the patient  Position: PRONE  Additional Comments: Vital signs were monitored before and after the procedure. Patient was prepped and draped in the usual sterile fashion. The correct patient, procedure, and site was verified.   Injection Procedure Details:   Procedure diagnoses: Lumbar radiculopathy [M54.16]    Meds Administered:  Meds ordered this encounter  Medications   methylPREDNISolone acetate (DEPO-MEDROL) injection 80 mg    Laterality: Left  Location/Site: L5  Needle:5.0 in., 22 ga.  Short bevel or Quincke spinal needle  Needle Placement: Transforaminal  Findings:    -Comments: Excellent flow of contrast along the nerve, nerve root and into the epidural space.  Procedure Details: After squaring off the end-plates to get a true AP view, the C-arm was positioned so that an oblique view of the foramen as noted above was visualized. The target area is just inferior to the "nose of the scotty dog" or sub pedicular. The soft tissues overlying this structure were infiltrated with 2-3 ml. of 1% Lidocaine without Epinephrine.  The spinal needle was inserted toward the target using a "trajectory" view along the fluoroscope beam.  Under AP and lateral visualization, the needle was advanced so it did not puncture dura and was located close the 6 O'Clock position of the pedical in AP tracterory. Biplanar projections were used to confirm position. Aspiration was confirmed to be negative for CSF and/or blood. A 1-2 ml. volume of Isovue-250 was injected and flow of contrast was noted at each level. Radiographs were obtained for documentation purposes.   After attaining the desired flow of  contrast documented above, a 0.5 to 1.0 ml test dose of 0.25% Marcaine was injected into each respective transforaminal space.  The patient was observed for 90 seconds post injection.  After no sensory deficits were reported, and normal lower extremity motor function was noted,   the above injectate was administered so that equal amounts of the injectate were placed at each foramen (level) into the transforaminal epidural space.   Additional Comments:  No complications occurred Dressing: 2 x 2 sterile gauze and Band-Aid    Post-procedure details: Patient was observed during the procedure. Post-procedure instructions were reviewed.  Patient left the clinic in stable condition.

## 2022-12-05 NOTE — Progress Notes (Signed)
Jared [name] - 67 y.o. male MRN [id number]  Date of birth: November 26, 1955  Office Visit Note: Visit Date: 11/27/2022 PCP: [name], MD Referred by: Kathryne Hitch*  Subjective: Chief Complaint  Patient presents with   Lower Back - Pain   HPI:  Jared [name] is a 67 y.o. male who comes in today at the request of Dr. Doneen Poisson for planned Left L5-S1 Lumbar Transforaminal epidural steroid injection with fluoroscopic guidance.  The patient has failed conservative care including home exercise, medications, time and activity modification.  This injection will be diagnostic and hopefully therapeutic.  Please see requesting physician notes for further details and justification.   ROS Otherwise per HPI.  Assessment & Plan: Visit Diagnoses:    ICD-10-CM   1. Lumbar radiculopathy  M54.16 XR C-ARM NO REPORT    Epidural Steroid injection    methylPREDNISolone acetate (DEPO-MEDROL) injection 80 mg    2. Foraminal stenosis of lumbar region  M48.061       Plan: No additional findings.   Meds & Orders:  Meds ordered this encounter  Medications   methylPREDNISolone acetate (DEPO-MEDROL) injection 80 mg    Orders Placed This Encounter  Procedures   XR C-ARM NO REPORT   Epidural Steroid injection    Follow-up: Return for visit to requesting provider as needed.   Procedures: No procedures performed  Lumbosacral Transforaminal Epidural Steroid Injection - Sub-Pedicular Approach with Fluoroscopic Guidance  Patient: Jared [name]      Date of Birth: January 07, 1956 MRN: [id number] PCP: [name], MD      Visit Date: 11/27/2022   Universal Protocol:    Date/Time: 11/27/2022  Consent Given By: the patient  Position: PRONE  Additional Comments: Vital signs were monitored before and after the procedure. Patient was prepped and draped in the usual sterile fashion. The correct patient, procedure, and site was verified.   Injection Procedure Details:   Procedure  diagnoses: Lumbar radiculopathy [M54.16]    Meds Administered:  Meds ordered this encounter  Medications   methylPREDNISolone acetate (DEPO-MEDROL) injection 80 mg    Laterality: Left  Location/Site: L5  Needle:5.0 in., 22 ga.  Short bevel or Quincke spinal needle  Needle Placement: Transforaminal  Findings:    -Comments: Excellent flow of contrast along the nerve, nerve root and into the epidural space.  Procedure Details: After squaring off the end-plates to get a true AP view, the C-arm was positioned so that an oblique view of the foramen as noted above was visualized. The target area is just inferior to the "nose of the scotty dog" or sub pedicular. The soft tissues overlying this structure were infiltrated with 2-3 ml. of 1% Lidocaine without Epinephrine.  The spinal needle was inserted toward the target using a "trajectory" view along the fluoroscope beam.  Under AP and lateral visualization, the needle was advanced so it did not puncture dura and was located close the 6 O'Clock position of the pedical in AP tracterory. Biplanar projections were used to confirm position. Aspiration was confirmed to be negative for CSF and/or blood. A 1-2 ml. volume of Isovue-250 was injected and flow of contrast was noted at each level. Radiographs were obtained for documentation purposes.   After attaining the desired flow of contrast documented above, a 0.5 to 1.0 ml test dose of 0.25% Marcaine was injected into each respective transforaminal space.  The patient was observed for 90 seconds post injection.  After no sensory deficits were reported, and normal lower extremity motor function was  noted,   the above injectate was administered so that equal amounts of the injectate were placed at each foramen (level) into the transforaminal epidural space.   Additional Comments:  No complications occurred Dressing: 2 x 2 sterile gauze and Band-Aid    Post-procedure details: Patient was observed  during the procedure. Post-procedure instructions were reviewed.  Patient left the clinic in stable condition.    Clinical History: MRI LUMBAR SPINE WITHOUT CONTRAST   TECHNIQUE: Multiplanar, multisequence MR imaging of the lumbar spine was performed. No intravenous contrast was administered.   COMPARISON:  Lumbar radiographs 07/12/2022. CT Abdomen and Pelvis 11/18/2020.   FINDINGS: Segmentation:  Normal.   Alignment: Chronic straightening of lumbar lordosis. Mild degenerative appearing retrolisthesis of L5 on S1.   Vertebrae: Chronic degenerative endplate marrow signal changes at L5-S1, to a lesser extent the anterior inferior vertebral body endplates elsewhere. Normal background marrow signal. No marrow edema or evidence of acute osseous abnormality. Intact visible sacrum and SI joints.   Conus medullaris and cauda equina: Conus extends to the L1 level. No lower spinal cord or conus signal abnormality. Small lipoma of the filum terminalis, normal variant.   Paraspinal and other soft tissues: Negative.   Disc levels:   T11-T12 through L4-L5 are largely normal for age. Minor if any disc bulging, and generally mild facet and/or ligament flavum hypertrophy at those levels.   No significant spinal stenosis at those levels. There is mild epidural lipomatosis at L3-L4.   However, there is mild to moderate left neural foraminal stenosis at L4-L5 due to combined disc osteophyte complex and facet hypertrophy.   L5-S1: Chronic severe disc space loss. Vacuum disc here in 2022. Circumferential disc osteophyte complex. Relatively mild facet hypertrophy. No spinal or lateral recess stenosis. Moderate to severe bilateral L5 foraminal stenosis, greater on the right.   IMPRESSION: 1. 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 2. L4-L5 leftward disc and endplate spurring combined  with facet hypertrophy results in up to moderate left L4 neural foraminal stenosis. 3. Generally age-appropriate lumbar spine degeneration elsewhere. No spinal stenosis.     Electronically Signed   By: Odessa Fleming M.D.   On: 11/04/2022 11:53     Objective:  VS:  HT:    WT:   BMI:     BP:112/63  HR:(!) 111bpm  TEMP: ( )  RESP:  Physical Exam Vitals and nursing note reviewed.  Constitutional:      General: He is not in acute distress.    Appearance: Normal appearance. He is not ill-appearing.  HENT:     Head: Normocephalic and atraumatic.     Right Ear: External ear normal.     Left Ear: External ear normal.     Nose: No congestion.  Eyes:     Extraocular Movements: Extraocular movements intact.  Cardiovascular:     Rate and Rhythm: Normal rate.     Pulses: Normal pulses.  Pulmonary:     Effort: Pulmonary effort is normal. No respiratory distress.  Abdominal:     General: There is no distension.     Palpations: Abdomen is soft.  Musculoskeletal:        General: No tenderness or signs of injury.     Cervical back: Neck supple.     Right lower leg: No edema.     Left lower leg: No edema.     Comments: Patient has good distal strength without  clonus.  Skin:    Findings: No erythema or rash.  Neurological:     General: No focal deficit present.     Mental Status: He is alert and oriented to person, place, and time.     Sensory: No sensory deficit.     Motor: No weakness or abnormal muscle tone.     Coordination: Coordination normal.  Psychiatric:        Mood and Affect: Mood normal.        Behavior: Behavior normal.      Imaging: No results found.

## 2022-12-06 ENCOUNTER — Other Ambulatory Visit: Payer: Self-pay | Admitting: Family Medicine

## 2022-12-06 ENCOUNTER — Other Ambulatory Visit: Payer: Self-pay

## 2022-12-06 DIAGNOSIS — J449 Chronic obstructive pulmonary disease, unspecified: Secondary | ICD-10-CM

## 2022-12-06 MED ORDER — ALBUTEROL SULFATE HFA 108 (90 BASE) MCG/ACT IN AERS
2.0000 | INHALATION_SPRAY | Freq: Four times a day (QID) | RESPIRATORY_TRACT | 0 refills | Status: DC | PRN
Start: 2022-12-06 — End: 2023-05-21
  Filled 2022-12-06 – 2023-02-24 (×3): qty 54, 75d supply, fill #0

## 2022-12-07 ENCOUNTER — Encounter (HOSPITAL_COMMUNITY)
Admission: RE | Disposition: A | Discharge status: Home / Self Care | Payer: Self-pay | Source: Home / Self Care | Attending: Vascular Surgery

## 2022-12-07 ENCOUNTER — Other Ambulatory Visit: Payer: Self-pay

## 2022-12-07 ENCOUNTER — Ambulatory Visit (HOSPITAL_COMMUNITY)
Admission: RE | Admit: 2022-12-07 | Discharge: 2022-12-07 | Disposition: A | Discharge status: Home / Self Care | Payer: Medicare HMO | Attending: Vascular Surgery | Admitting: Vascular Surgery

## 2022-12-07 DIAGNOSIS — Z9981 Dependence on supplemental oxygen: Secondary | ICD-10-CM | POA: Diagnosis not present

## 2022-12-07 DIAGNOSIS — E1151 Type 2 diabetes mellitus with diabetic peripheral angiopathy without gangrene: Secondary | ICD-10-CM | POA: Diagnosis not present

## 2022-12-07 DIAGNOSIS — I70229 Atherosclerosis of native arteries of extremities with rest pain, unspecified extremity: Secondary | ICD-10-CM

## 2022-12-07 DIAGNOSIS — F17210 Nicotine dependence, cigarettes, uncomplicated: Secondary | ICD-10-CM | POA: Insufficient documentation

## 2022-12-07 DIAGNOSIS — J449 Chronic obstructive pulmonary disease, unspecified: Secondary | ICD-10-CM | POA: Insufficient documentation

## 2022-12-07 DIAGNOSIS — I70222 Atherosclerosis of native arteries of extremities with rest pain, left leg: Secondary | ICD-10-CM | POA: Diagnosis not present

## 2022-12-07 HISTORY — PX: PERIPHERAL VASCULAR INTERVENTION: CATH118257

## 2022-12-07 HISTORY — PX: ABDOMINAL AORTOGRAM W/LOWER EXTREMITY: CATH118223

## 2022-12-07 LAB — POCT I-STAT, CHEM 8
BUN: 10 mg/dL (ref 8–23)
Calcium, Ion: 1.08 mmol/L — ABNORMAL LOW (ref 1.15–1.40)
Chloride: 97 mmol/L — ABNORMAL LOW (ref 98–111)
Creatinine, Ser: 1 mg/dL (ref 0.61–1.24)
Glucose, Bld: 127 mg/dL — ABNORMAL HIGH (ref 70–99)
HCT: 28 % — ABNORMAL LOW (ref 39.0–52.0)
Hemoglobin: 9.5 g/dL — ABNORMAL LOW (ref 13.0–17.0)
Potassium: 4.1 mmol/L (ref 3.5–5.1)
Sodium: 138 mmol/L (ref 135–145)
TCO2: 36 mmol/L — ABNORMAL HIGH (ref 22–32)

## 2022-12-07 LAB — GLUCOSE, CAPILLARY: Glucose-Capillary: 117 mg/dL — ABNORMAL HIGH (ref 70–99)

## 2022-12-07 SURGERY — ABDOMINAL AORTOGRAM W/LOWER EXTREMITY
Anesthesia: LOCAL

## 2022-12-07 MED ORDER — ONDANSETRON HCL 4 MG/2ML IJ SOLN: 4.0000 mg | Freq: Four times a day (QID) | INTRAMUSCULAR | Status: DC | PRN

## 2022-12-07 MED ORDER — CLOPIDOGREL BISULFATE 300 MG PO TABS
ORAL_TABLET | ORAL | Status: DC | PRN
  Administered 2022-12-07: 300 mg via ORAL

## 2022-12-07 MED ORDER — SODIUM CHLORIDE 0.9 % IV SOLN: INTRAVENOUS | Status: DC

## 2022-12-07 MED ORDER — SODIUM CHLORIDE 0.9% FLUSH: 3.0000 mL | INTRAVENOUS | Status: DC | PRN

## 2022-12-07 MED ORDER — HEPARIN SODIUM (PORCINE) 1000 UNIT/ML IJ SOLN
INTRAMUSCULAR | Status: AC
  Filled 2022-12-07: qty 10

## 2022-12-07 MED ORDER — HEPARIN SODIUM (PORCINE) 1000 UNIT/ML IJ SOLN
INTRAMUSCULAR | Status: DC | PRN
  Administered 2022-12-07: 5000 [IU] via INTRAVENOUS
  Administered 2022-12-07: 10000 [IU] via INTRAVENOUS

## 2022-12-07 MED ORDER — CLOPIDOGREL BISULFATE 300 MG PO TABS
ORAL_TABLET | ORAL | Status: AC
  Filled 2022-12-07: qty 1

## 2022-12-07 MED ORDER — FENTANYL CITRATE (PF) 100 MCG/2ML IJ SOLN
INTRAMUSCULAR | Status: AC
  Filled 2022-12-07: qty 2

## 2022-12-07 MED ORDER — FENTANYL CITRATE (PF) 100 MCG/2ML IJ SOLN
INTRAMUSCULAR | Status: DC | PRN
  Administered 2022-12-07 (×2): 25 ug via INTRAVENOUS
  Administered 2022-12-07: 50 ug via INTRAVENOUS

## 2022-12-07 MED ORDER — HYDRALAZINE HCL 20 MG/ML IJ SOLN: 5.0000 mg | INTRAMUSCULAR | Status: DC | PRN

## 2022-12-07 MED ORDER — HEPARIN (PORCINE) IN NACL 1000-0.9 UT/500ML-% IV SOLN
INTRAVENOUS | Status: DC | PRN
  Administered 2022-12-07: 1000 mL

## 2022-12-07 MED ORDER — SODIUM CHLORIDE 0.9% FLUSH: 3.0000 mL | Freq: Two times a day (BID) | INTRAVENOUS | Status: DC

## 2022-12-07 MED ORDER — IODIXANOL 320 MG/ML IV SOLN
INTRAVENOUS | Status: DC | PRN
  Administered 2022-12-07: 120 mL via INTRA_ARTERIAL

## 2022-12-07 MED ORDER — CLOPIDOGREL BISULFATE 75 MG PO TABS: 300.0000 mg | ORAL_TABLET | Freq: Once | ORAL | Status: DC

## 2022-12-07 MED ORDER — ASPIRIN 81 MG PO TBEC
81.0000 mg | DELAYED_RELEASE_TABLET | Freq: Every day | ORAL | 2 refills | Status: DC
  Filled 2022-12-07: qty 120, 120d supply, fill #0
  Filled 2023-01-03: qty 120, 120d supply, fill #1

## 2022-12-07 MED ORDER — ACETAMINOPHEN 325 MG PO TABS: 650.0000 mg | ORAL_TABLET | ORAL | Status: DC | PRN

## 2022-12-07 MED ORDER — LABETALOL HCL 5 MG/ML IV SOLN: 10.0000 mg | INTRAVENOUS | Status: DC | PRN

## 2022-12-07 MED ORDER — LIDOCAINE HCL (PF) 1 % IJ SOLN
INTRAMUSCULAR | Status: AC
  Filled 2022-12-07: qty 30

## 2022-12-07 MED ORDER — SODIUM CHLORIDE 0.9 % IV SOLN: 250.0000 mL | INTRAVENOUS | Status: DC | PRN

## 2022-12-07 MED ORDER — MIDAZOLAM HCL 2 MG/2ML IJ SOLN
INTRAMUSCULAR | Status: AC
  Filled 2022-12-07: qty 2

## 2022-12-07 MED ORDER — ASPIRIN 81 MG PO TBEC
81.0000 mg | DELAYED_RELEASE_TABLET | Freq: Every day | ORAL | Status: DC
Start: 2022-12-07 — End: 2022-12-07

## 2022-12-07 MED ORDER — MIDAZOLAM HCL 2 MG/2ML IJ SOLN
INTRAMUSCULAR | Status: DC | PRN
  Administered 2022-12-07 (×2): 1 mg via INTRAVENOUS

## 2022-12-07 MED ORDER — LIDOCAINE HCL (PF) 1 % IJ SOLN
INTRAMUSCULAR | Status: DC | PRN
  Administered 2022-12-07: 20 mL via INTRADERMAL

## 2022-12-07 MED ORDER — SODIUM CHLORIDE 0.9 % WEIGHT BASED INFUSION: 1.0000 mL/kg/h | INTRAVENOUS | Status: DC

## 2022-12-07 SURGICAL SUPPLY — 29 items
BALLN MUSTANG 4X150X135 (BALLOONS) ×2
BALLN MUSTANG 5X150X135 (BALLOONS) ×2
BALLOON MUSTANG 4X150X135 (BALLOONS) IMPLANT
BALLOON MUSTANG 5X150X135 (BALLOONS) IMPLANT
CATH OMNI FLUSH 5F 65CM (CATHETERS) IMPLANT
CATH QUICKCROSS .035X135CM (MICROCATHETER) IMPLANT
CATH QUICKCROSS SUPP .018X90CM (MICROCATHETER) IMPLANT
CLOSURE PERCLOSE PROSTYLE (VASCULAR PRODUCTS) IMPLANT
DEVICE ONE SNARE 10MM (MISCELLANEOUS) IMPLANT
DEVICE TORQUE .025-.038 (MISCELLANEOUS) IMPLANT
GLIDEWIRE ADV .035X260CM (WIRE) IMPLANT
GUIDEWIRE ANGLED .035X260CM (WIRE) IMPLANT
KIT ENCORE 26 ADVANTAGE (KITS) IMPLANT
KIT ESSENTIALS PG (KITS) IMPLANT
KIT MICROPUNCTURE NIT STIFF (SHEATH) IMPLANT
KIT PV (KITS) ×2 IMPLANT
SHEATH CATAPULT 6FR 45 (SHEATH) IMPLANT
SHEATH GLIDE SLENDER 4/5FR (SHEATH) IMPLANT
SHEATH PINNACLE 5F 10CM (SHEATH) IMPLANT
SHEATH PROBE COVER 6X72 (BAG) IMPLANT
STENT ELUVIA 6X150X130 (Permanent Stent) IMPLANT
STENT ELUVIA 6X80X130 (Permanent Stent) IMPLANT
STOPCOCK MORSE 400PSI 3WAY (MISCELLANEOUS) IMPLANT
SYR MEDRAD MARK 7 150ML (SYRINGE) ×2 IMPLANT
TRANSDUCER W/STOPCOCK (MISCELLANEOUS) ×2 IMPLANT
TRAY PV CATH (CUSTOM PROCEDURE TRAY) ×2 IMPLANT
WIRE G V18X300CM (WIRE) IMPLANT
WIRE SHEPHERD 30G .018 (WIRE) IMPLANT
WIRE STARTER BENTSON 035X150 (WIRE) IMPLANT

## 2022-12-07 NOTE — Op Note (Signed)
DATE OF SERVICE: 12/07/2022  PATIENT:  Jared [name]  67 y.o. 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 5) Ultrasound guided left posterior tibial artery access 6) Left femoropopliteal angioplasty and stenting (6x179mm Eluvia x4; 6x24mm Eluvia) 7) Conscious sedation (94 minutes)  SURGEON:  Rande Brunt. Lenell Antu, MD  ASSISTANT: none  ANESTHESIA:   local and IV sedation  ESTIMATED BLOOD LOSS: minimal  LOCAL MEDICATIONS USED:  LIDOCAINE   COUNTS: confirmed correct.  PATIENT DISPOSITION:  PACU - hemodynamically stable.   Delay start of Pharmacological VTE agent (>24hrs) due to surgical blood loss or risk of bleeding: no  INDICATION FOR PROCEDURE: Jared [name] is a 67 y.o. male with left leg ischemic rest pain. After careful discussion of risks, benefits, and alternatives the patient was offered angiography. The patient understood and wished to proceed.  OPERATIVE FINDINGS: SMA patent  Right renal artery patent Left renal artery not seen Terminal aorta patent without stenosis Common iliac arteries patent Left hypogastric artery occluded Right hypogastric artery patent Bilateral external iliac arteries patent  Left lower extremity: Common femoral artery: patent  Profunda femoris artery: patent, fills leg via collaterals  Superficial femoral artery: occluded Popliteal artery: reconstitutes above the knee; behind and below knee vessel patent Anterior tibial artery: patent to the foot Tibioperoneal trunk: patent Peroneal artery: patent to the ankle, where it arborizes Posterior tibial artery: patent to the plantar arch Pedal circulation:  fills via AT>PT  GLASS score. FP 4. IP 0. Stage IV.  WIfI score. N/A - no wound.  DESCRIPTION OF PROCEDURE: After identification of the patient in the pre-operative holding area, the patient was transferred to the operating room. The patient was positioned supine on the operating room table. Anesthesia was induced. The groins was prepped and draped in standard fashion. A surgical pause was performed confirming correct patient, procedure, and operative location.  The right groin was anesthetized with subcutaneous injection of 1% lidocaine. Using ultrasound guidance, the right common femoral artery was accessed with micropuncture technique. Fluoroscopy was used to confirm cannulation over the femoral head. The 40F sheath was upsized to 574F.   A Benson wire was advanced into the distal aorta. Over the wire an omni flush catheter was advanced to the level of L2. Aortogram was performed - see above for details.   The left common iliac artery was selected with an omniflush catheter and glidewire guidewire. The wire was advanced into the common femoral artery. Over the wire the omni flush catheter was advanced into the external iliac artery. Selective angiography was performed - see above for details.   The decision was made to intervene. The patient was heparinized with 10,000 units of heparin. The 574F sheath was exchanged for a 74F x 45cm sheath. Selective angiography of the left lower extremity was performed prior to intervention.   Try to variety of techniques to cross a nearly flush SFA occlusion antegrade.  I was not able to advance a wire back into the popliteal artery.  I elected to approach this via retrograde technique.  The patient is not a physiologic candidate for bypass; he is from oxygen dependent.  I felt his best option was an endovascular solution.  Ultrasound guidance was used to obtain  micropuncture access in the left posterior tibial artery.  Our access was upsized to a 4/5 [place] slender  sheath.  I was able to cross the lesion retrograde with a 035 Glidewire advantage.  From the right femoral sheath I snared a floppy Glidewire and externalized the wire.  We then performed femoral-popliteal angioplasty and stenting of the lesions (see above for size and length of stenting).  The stents were postdilated with a 5 x 150 mm Mustang balloon.  Good technical result was achieved with restoration of flow through the SFA and preserved trifurcation and foot runoff.  A Perclose was used to close the right common femoral arteriotomy. Hemostasis was excellent upon completion.  Conscious sedation was administered with the use of IV fentanyl and midazolam under continuous physician and nurse monitoring.  Heart rate, blood pressure, and oxygen saturation were continuously monitored.  Total sedation time was 94 minutes  Upon completion of the case instrument and sharps counts were confirmed correct. The patient was transferred to the  PACU in good condition. I was present for all portions of the procedure.  PLAN: Aspirin 81 mg by mouth daily.  Plavix 75 mg by mouth daily.  High intensity statin therapy.  Follow-up with me in 1 month with ABI and left lower extremity duplex.  Rande Brunt. Lenell Antu, MD [facility] Phone Number: [phone] 12/07/2022 12:06 PM

## 2022-12-07 NOTE — Progress Notes (Signed)
Patient ambulated short distance in hall. Right groin remains unremarkable.

## 2022-12-10 ENCOUNTER — Encounter (HOSPITAL_COMMUNITY): Payer: Self-pay | Admitting: Vascular Surgery

## 2022-12-10 NOTE — H&P (Signed)
[facility] [place]   ASSESSMENT / PLAN: Jared [name] is a 67 y.o. male with atherosclerosis of native arteries of left leg causing ischemic rest pain.   Recommend:  Abstinence from all tobacco products. Blood glucose control with goal A1c < 7%. Blood pressure control with goal blood pressure < 140/90 mmHg. Lipid reduction therapy with goal LDL-C <70 mg/dL. Aspirin 81mg  PO QD.  Atorvastatin 40-80mg  PO QD (or other "high intensity" statin therapy).   The patient is on best medical therapy for peripheral arterial disease. The patient has been counseled about the risks of tobacco use in atherosclerotic disease. The patient has been counseled to abstain from any tobacco use. An aortogram with bilateral lower extremity runoff angiography and Left lower extremity intervention and is indicated to better evaluate the patient's lower extremity circulation because of the limb threatening nature of the patient's diagnosis. Based on the patient's clinical exam and non-invasive data, we anticipate an endovascular intervention in the femoropopliteal vessels. Stenting would be favored because of the improved primary patency of these interventions as compared to plain balloon angioplasty.   CHIEF COMPLAINT: left leg pain   HISTORY OF PRESENT ILLNESS: Jared [name] is a 67 y.o. male referred to clinic for evaluation of left leg pain and noninvasive management suggestive of peripheral arterial disease.  The patient is a longtime smoker.  He is dependent on continuous oxygen therapy for dyspnea associated with COPD.  He is an active smoker.  He reports smoking about half pack a day.  The patient reports left leg pain which is constant and occasionally wakes him from sleep.  The pain is throughout his left calf and into the foot.  The pain is significantly worsened by walking.  He is only able to walk a few feet before the pain becomes disabling.  He has no ulcers about his feet.          Past Medical History:  Diagnosis Date   AVM (arteriovenous malformation)     CAD (coronary artery disease)      a. LHC 5/12:  LAD 20, pLCx 20, pRCA 40, dRCA 40, EF 35%, diff HK  //  b. Myoview 4/16: Overall Impression:  High risk stress nuclear study There is no evidence of ischemia.  There is severe LV dysfunction. LV Ejection Fraction: 30%.  LV Wall Motion:  There is global LV hypokinesis.     CAP (community acquired pneumonia) 09/2013   Chronic combined systolic and diastolic CHF (congestive heart failure) (HCC)      a. Echo 4/16:Mild LVH, EF 40-45%, diffuse HK //  b. Echo 8/17: EF 35-40%, diffuse HK, diastolic dysfunction, aortic sclerosis, trivial MR, moderate LAE, normal RVSF, moderate RAE, mild TR, PASP 42 mmHg // c. Echo 4/18: Mild concentric LVH, EF 30-35, normal wall motion, grade 1 diastolic dysfunction, PASP 49   Chronic respiratory failure (HCC)     Cluster headache      "hx; haven't had one in awhile" (01/09/2016)   COPD (chronic obstructive pulmonary disease) (HCC)      [name] 01/09/2016   DM (diabetes mellitus) (HCC)     History of CVA (cerebrovascular accident)     Hypertension     IDA (iron deficiency anemia)     Moderate tobacco use disorder     NICM (nonischemic cardiomyopathy) (HCC)     Nicotine addiction     Tobacco abuse     Type 2 diabetes mellitus (HCC) 05/14/2016  Past Surgical History:  Procedure Laterality Date   BIOPSY   11/12/2020    Procedure: BIOPSY;  Surgeon: Lemar Lofty., MD;  Location: [facility] ENDOSCOPY;  Service: Gastroenterology;;   CARDIAC CATHETERIZATION   10/2010    LM normal, LAD with 20% irregularities, LCX with 20%, RCA with 40% prox and 40% distal - EF of 35%   CATARACT EXTRACTION, BILATERAL       COLONOSCOPY W/ BIOPSIES AND POLYPECTOMY       COLONOSCOPY WITH PROPOFOL N/A 09/06/2018    Procedure: COLONOSCOPY WITH PROPOFOL;  Surgeon: Tressia Danas, MD;  Location: [facility] ENDOSCOPY;  Service: Gastroenterology;  Laterality: N/A;    ENTEROSCOPY N/A 09/28/2018    Procedure: ENTEROSCOPY;  Surgeon: Jeani Hawking, MD;  Location: [facility] ENDOSCOPY;  Service: Endoscopy;  Laterality: N/A;   ENTEROSCOPY N/A 10/28/2018    Procedure: ENTEROSCOPY;  Surgeon: Tressia Danas, MD;  Location: [facility] ENDOSCOPY;  Service: Gastroenterology;  Laterality: N/A;   ENTEROSCOPY N/A 10/09/2020    Procedure: ENTEROSCOPY;  Surgeon: Hilarie Fredrickson, MD;  Location: [facility] ENDOSCOPY;  Service: Endoscopy;  Laterality: N/A;   ENTEROSCOPY N/A 03/22/2022    Procedure: ENTEROSCOPY;  Surgeon: Benancio Deeds, MD;  Location: [facility] ENDOSCOPY;  Service: Gastroenterology;  Laterality: N/A;   ESOPHAGOGASTRODUODENOSCOPY N/A 11/12/2020    Procedure: ESOPHAGOGASTRODUODENOSCOPY (EGD);  Surgeon: Lemar Lofty., MD;  Location: [facility] ENDOSCOPY;  Service: Gastroenterology;  Laterality: N/A;   ESOPHAGOGASTRODUODENOSCOPY (EGD) WITH PROPOFOL N/A 09/05/2018    Procedure: ESOPHAGOGASTRODUODENOSCOPY (EGD) WITH PROPOFOL;  Surgeon: Benancio Deeds, MD;  Location: [facility] ENDOSCOPY;  Service: Gastroenterology;  Laterality: N/A;   ESOPHAGOGASTRODUODENOSCOPY (EGD) WITH PROPOFOL N/A 11/19/2020    Procedure: ESOPHAGOGASTRODUODENOSCOPY (EGD) WITH PROPOFOL;  Surgeon: Beverley Fiedler, MD;  Location: [facility] ENDOSCOPY;  Service: Gastroenterology;  Laterality: N/A;   EXCISION MASS HEAD       GIVENS CAPSULE STUDY N/A 09/06/2018    Procedure: GIVENS CAPSULE STUDY;  Surgeon: Tressia Danas, MD;  Location: [facility] ENDOSCOPY;  Service: Gastroenterology;  Laterality: N/A;   GIVENS CAPSULE STUDY N/A 09/26/2018    Procedure: GIVENS CAPSULE STUDY;  Surgeon: Beverley Fiedler, MD;  Location: [facility] ENDOSCOPY;  Service: Gastroenterology;  Laterality: N/A;   GIVENS CAPSULE STUDY N/A 06/14/2019    Procedure: GIVENS CAPSULE STUDY;  Surgeon: Napoleon Form, MD;  Location: [facility] ENDOSCOPY;  Service: Endoscopy;  Laterality: N/A;   HEMOSTASIS CLIP PLACEMENT   11/12/2020    Procedure: HEMOSTASIS CLIP PLACEMENT;  Surgeon: Lemar Lofty., MD;  Location: [facility] ENDOSCOPY;  Service: Gastroenterology;;   HEMOSTASIS CONTROL   11/12/2020    Procedure: HEMOSTASIS CONTROL;  Surgeon: Lemar Lofty., MD;  Location: [facility] ENDOSCOPY;  Service: Gastroenterology;;   HOT HEMOSTASIS N/A 10/28/2018    Procedure: HOT HEMOSTASIS (ARGON PLASMA COAGULATION/BICAP);  Surgeon: Tressia Danas, MD;  Location: [facility] ENDOSCOPY;  Service: Gastroenterology;  Laterality: N/A;   HOT HEMOSTASIS N/A 11/12/2020    Procedure: HOT HEMOSTASIS (ARGON PLASMA COAGULATION/BICAP);  Surgeon: Lemar Lofty., MD;  Location: [facility] ENDOSCOPY;  Service: Gastroenterology;  Laterality: N/A;   HOT HEMOSTASIS N/A 03/22/2022    Procedure: HOT HEMOSTASIS (ARGON PLASMA COAGULATION/BICAP);  Surgeon: Benancio Deeds, MD;  Location: [facility] ENDOSCOPY;  Service: Gastroenterology;  Laterality: N/A;   INCISION AND DRAINAGE PERIRECTAL ABSCESS N/A 06/05/2017    Procedure: IRRIGATION AND DEBRIDEMENT PERIRECTAL ABSCESS;  Surgeon: Andria Meuse, MD;  Location: [facility] OR;  Service: General;  Laterality: N/A;   SUBMUCOSAL TATTOO INJECTION   11/12/2020    Procedure: SUBMUCOSAL TATTOO INJECTION;  Surgeon:  Mansouraty, Netty Starring., MD;  Location: [facility] ENDOSCOPY;  Service: Gastroenterology;;   VIDEO BRONCHOSCOPY Bilateral 05/08/2016    Procedure: VIDEO BRONCHOSCOPY WITH FLUORO;  Surgeon: Oretha Milch, MD;  Location: [facility] ENDOSCOPY;  Service: Cardiopulmonary;  Laterality: Bilateral;           Family History  Problem Relation Age of Onset   Heart disease Mother     Diabetes Mother     Colon cancer Mother     Liver cancer Mother     Cancer Father          type unknown   Diabetes Sister          x 2   Diabetes Brother        Social History         Socioeconomic History   Marital status: Single      Spouse name: Not on file   Number of children: 1   Years of education: Not on file   Highest education level: Not on file  Occupational History   Occupation: retired  Tobacco Use    Smoking status: Every Day      Packs/day: 0.50      Years: 47.00      Additional pack years: 0.00      Total pack years: 23.50      Types: Cigarettes   Smokeless tobacco: Never   Tobacco comments:      4/5 cigs  Vaping Use   Vaping Use: Never used  Substance and Sexual Activity   Alcohol use: Not Currently      Alcohol/week: 0.0 standard drinks of alcohol      Comment: last drink was before xmas   Drug use: No      Types: Cocaine, Marijuana      Comment: "nothing in 20 years"   Sexual activity: Not Currently  Other Topics Concern   Not on file  Social History Narrative    unemployed    Social Determinants of Health        Financial Resource Strain: Low Risk  (05/25/2022)    Overall Financial Resource Strain (CARDIA)     Difficulty of Paying Living Expenses: Not hard at all  Food Insecurity: No Food Insecurity (05/25/2022)    Hunger Vital Sign     Worried About Running Out of Food in the Last Year: Never true     Ran Out of Food in the Last Year: Never true  Transportation Needs: No Transportation Needs (05/25/2022)    PRAPARE - Therapist, art (Medical): No     Lack of Transportation (Non-Medical): No  Physical Activity: Inactive (05/25/2022)    Exercise Vital Sign     Days of Exercise per Week: 0 days     Minutes of Exercise per Session: 0 min  Stress: No Stress Concern Present (05/25/2022)    [employer] of Occupational Health - Occupational Stress Questionnaire     Feeling of Stress : Not at all  Social Connections: Unknown (05/25/2022)    Social Connection and Isolation Panel [NHANES]     Frequency of Communication with Friends and Family: Three times a week     Frequency of Social Gatherings with Friends and Family: Never     Attends Religious Services: Not on file     Active Member of Clubs or Organizations: No     Attends Banker Meetings: Never     Marital Status: Divorced  Catering manager Violence: Not  At Risk  (01/06/2021)    Humiliation, Afraid, Rape, and Kick questionnaire     Fear of Current or Ex-Partner: No     Emotionally Abused: No     Physically Abused: No     Sexually Abused: No          Allergies  Allergen Reactions   Lisinopril Cough            Current Outpatient Medications  Medication Sig Dispense Refill   Accu-Chek Softclix Lancets lancets Use as instructed daily before meals 100 each 12   albuterol (PROVENTIL) (2.5 MG/3ML) 0.083% nebulizer solution Take 3 mLs by nebulization every 6 (six) hours as needed for wheezing or shortness of breath. 90 mL 3   albuterol (VENTOLIN HFA) 108 (90 Base) MCG/ACT inhaler INHALE 2 PUFFS INTO THE LUNGS EVERY 6 (SIX) HOURS AS NEEDED FOR WHEEZING OR SHORTNESS OF BREATH. 54 g 0   Aspirin Effervescent (ALKA-SELTZER ORIGINAL PO) Take 1 packet by mouth 2 (two) times daily as needed (bloat).       atorvastatin (LIPITOR) 80 MG tablet Take 1 tablet (80 mg total) by mouth daily. 90 tablet 1   Blood Glucose Monitoring Suppl (ACCU-CHEK GUIDE) w/Device KIT Use as directed three times daily 1 kit 0   Budeson-Glycopyrrol-Formoterol (BREZTRI AEROSPHERE) 160-9-4.8 MCG/ACT AERO Take 2 puffs first thing in morning and then another 2 puffs about 12 hours later. 10.7 g 6   ergocalciferol (DRISDOL) 1.25 MG (50000 UT) capsule Take 1 capsule (50,000 Units total) by mouth once a week. 12 capsule 1   ezetimibe (ZETIA) 10 MG tablet Take 1 tablet (10 mg total) by mouth daily. 90 tablet 3   ferrous sulfate 325 (65 FE) MG tablet Take 1 tablet (325 mg total) by mouth daily with breakfast. 60 tablet 1   folic acid (FOLVITE) 1 MG tablet Take 1 tablet (1 mg total) by mouth daily. 30 tablet 0   furosemide (LASIX) 40 MG tablet Take 1 tablet (40 mg total) by mouth 2 (two) times daily. 180 tablet 1   gabapentin (NEURONTIN) 300 MG capsule Take 1 capsule (300 mg total) by mouth 2 (two) times daily. 90 capsule 1   glucose blood (ACCU-CHEK GUIDE) test strip Use as instructed daily 100  each 12   hydrALAZINE (APRESOLINE) 50 MG tablet Take 1 tablet (50 mg total) by mouth every 8 (eight) hours. 270 tablet 1   isosorbide mononitrate (IMDUR) 30 MG 24 hr tablet Take 1 tablet (30 mg total) by mouth daily. 90 tablet 1   metoprolol succinate (TOPROL-XL) 50 MG 24 hr tablet Take 1 tablet (50 mg total) by mouth daily. Take with or immediately following a meal. 90 tablet 1   OXYGEN Inhale 2 L/min into the lungs as directed. On exertion or when walking       pantoprazole (PROTONIX) 40 MG tablet Take 1 tablet (40 mg total) by mouth daily. 90 tablet 1   rivaroxaban (XARELTO) 20 MG TABS tablet Take 1 tablet (20 mg total) by mouth daily with supper. 90 tablet 1   terbinafine (LAMISIL AT ATHLETES FOOT) 1 % cream Apply 1 Application topically 2 (two) times daily. 42 g 1   tiZANidine (ZANAFLEX) 2 MG tablet Take 1 tablet (2 mg total) by mouth at bedtime. 30 tablet 0   nitroGLYCERIN (NITROSTAT) 0.4 MG SL tablet Place 1 tablet (0.4 mg total) under the tongue every 5 (five) minutes as needed for chest pain.  Current Facility-Administered Medications  Medication Dose Route Frequency Provider Last Rate Last Admin   methylPREDNISolone acetate (DEPO-MEDROL) injection 80 mg  80 mg Other Once Tyrell Antonio, MD          PHYSICAL EXAM    Vitals:    12/04/22 1411  BP: 117/69  Pulse: (!) 56  Resp: 20  Temp: 98.8 F (37.1 C)  SpO2: 96%  Weight: 221 lb (100.2 kg)  Height: 6\' 1"  (1.854 m)    Chronically ill-appearing gentleman in no acute distress Regular rate and rhythm On home oxygen via continuous nasal cannula 2+ femoral pulses bilaterally No palpable pedal pulses       PERTINENT LABORATORY AND RADIOLOGIC DATA   Most recent CBC     Latest Ref Rng & Units 10/25/2022   10:27 AM 07/26/2022    9:47 AM 06/07/2022   11:11 AM  CBC  WBC 3.4 - 10.8 x10E3/uL 6.9  8.8  7.5   Hemoglobin 13.0 - 17.7 g/dL 16.1  09.6  04.5   Hematocrit 37.5 - 51.0 % 38.2  40.7  37.1   Platelets  150 - 450 x10E3/uL 323  266  233.0       Most recent CMP     Latest Ref Rng & Units 10/25/2022   10:27 AM 03/24/2022    1:09 AM 03/23/2022    1:57 AM  CMP  Glucose 70 - 99 mg/dL 409  811  914   BUN 8 - 27 mg/dL 14  14  5    Creatinine 0.76 - 1.27 mg/dL 7.82  9.56  2.13   Sodium 134 - 144 mmol/L 142  142  144   Potassium 3.5 - 5.2 mmol/L 4.9  3.6  4.2   Chloride 96 - 106 mmol/L 96  99  101   CO2 20 - 29 mmol/L 29  38  36   Calcium 8.6 - 10.2 mg/dL 9.8  8.8  9.2   Total Protein 6.0 - 8.5 g/dL 6.7       Total Bilirubin 0.0 - 1.2 mg/dL 0.3       Alkaline Phos 44 - 121 IU/L 73       AST 0 - 40 IU/L 29       ALT 0 - 44 IU/L 26         +-------+-----------+-----------+------------+------------+  ABI/TBIToday's ABIToday's TBIPrevious ABIPrevious TBI  +-------+-----------+-----------+------------+------------+  Right 0.73       0.58                                 +-------+-----------+-----------+------------+------------+  Left  0.56       0                                    +-------+-----------+-----------+------------+------------+    Rande Brunt. Lenell Antu, MD [facility] Phone Number: [phone] 12/04/2022 2:36 PM     Total time spent on preparing this encounter including chart review, data review, collecting history, examining the patient, coordinating care for this new patient, 60 minutes.   Portions of this report may have been transcribed using voice recognition software.  Every effort has been made to ensure accuracy; however, inadvertent computerized transcription errors may still be present.

## 2022-12-11 LAB — POCT I-STAT, CHEM 8
BUN: 8 mg/dL (ref 8–23)
Calcium, Ion: 1.16 mmol/L (ref 1.15–1.40)
Chloride: 96 mmol/L — ABNORMAL LOW (ref 98–111)
Creatinine, Ser: 0.9 mg/dL (ref 0.61–1.24)
Glucose, Bld: 126 mg/dL — ABNORMAL HIGH (ref 70–99)
HCT: 26 % — ABNORMAL LOW (ref 39.0–52.0)
Hemoglobin: 8.8 g/dL — ABNORMAL LOW (ref 13.0–17.0)
Potassium: 4.3 mmol/L (ref 3.5–5.1)
Sodium: 138 mmol/L (ref 135–145)
TCO2: 35 mmol/L — ABNORMAL HIGH (ref 22–32)

## 2022-12-11 LAB — POCT ACTIVATED CLOTTING TIME
Activated Clotting Time: 195 seconds
Activated Clotting Time: 214 seconds

## 2022-12-25 ENCOUNTER — Other Ambulatory Visit: Payer: Self-pay | Admitting: *Deleted

## 2022-12-25 DIAGNOSIS — I739 Peripheral vascular disease, unspecified: Secondary | ICD-10-CM

## 2022-12-25 DIAGNOSIS — I70229 Atherosclerosis of native arteries of extremities with rest pain, unspecified extremity: Secondary | ICD-10-CM

## 2022-12-25 DIAGNOSIS — I70222 Atherosclerosis of native arteries of extremities with rest pain, left leg: Secondary | ICD-10-CM

## 2022-12-26 ENCOUNTER — Observation Stay (HOSPITAL_COMMUNITY)
Admission: EM | Admit: 2022-12-26 | Discharge: 2022-12-28 | Disposition: A | Discharge status: Home / Self Care | Payer: Medicare HMO | Attending: Internal Medicine | Admitting: Internal Medicine

## 2022-12-26 ENCOUNTER — Emergency Department (HOSPITAL_COMMUNITY): Payer: Medicare HMO

## 2022-12-26 ENCOUNTER — Encounter (HOSPITAL_COMMUNITY): Payer: Self-pay

## 2022-12-26 ENCOUNTER — Other Ambulatory Visit: Payer: Self-pay

## 2022-12-26 DIAGNOSIS — W44F3XA Food entering into or through a natural orifice, initial encounter: Secondary | ICD-10-CM | POA: Insufficient documentation

## 2022-12-26 DIAGNOSIS — Z7901 Long term (current) use of anticoagulants: Secondary | ICD-10-CM | POA: Insufficient documentation

## 2022-12-26 DIAGNOSIS — K2289 Other specified disease of esophagus: Secondary | ICD-10-CM | POA: Insufficient documentation

## 2022-12-26 DIAGNOSIS — R58 Hemorrhage, not elsewhere classified: Secondary | ICD-10-CM | POA: Diagnosis not present

## 2022-12-26 DIAGNOSIS — E119 Type 2 diabetes mellitus without complications: Secondary | ICD-10-CM | POA: Diagnosis not present

## 2022-12-26 DIAGNOSIS — I480 Paroxysmal atrial fibrillation: Secondary | ICD-10-CM

## 2022-12-26 DIAGNOSIS — Z8673 Personal history of transient ischemic attack (TIA), and cerebral infarction without residual deficits: Secondary | ICD-10-CM | POA: Diagnosis not present

## 2022-12-26 DIAGNOSIS — T182XXA Foreign body in stomach, initial encounter: Principal | ICD-10-CM | POA: Insufficient documentation

## 2022-12-26 DIAGNOSIS — I5042 Chronic combined systolic (congestive) and diastolic (congestive) heart failure: Secondary | ICD-10-CM | POA: Diagnosis not present

## 2022-12-26 DIAGNOSIS — I4891 Unspecified atrial fibrillation: Secondary | ICD-10-CM | POA: Diagnosis not present

## 2022-12-26 DIAGNOSIS — J441 Chronic obstructive pulmonary disease with (acute) exacerbation: Secondary | ICD-10-CM

## 2022-12-26 DIAGNOSIS — Z1152 Encounter for screening for COVID-19: Secondary | ICD-10-CM | POA: Insufficient documentation

## 2022-12-26 DIAGNOSIS — Z951 Presence of aortocoronary bypass graft: Secondary | ICD-10-CM | POA: Diagnosis not present

## 2022-12-26 DIAGNOSIS — Z7982 Long term (current) use of aspirin: Secondary | ICD-10-CM | POA: Diagnosis not present

## 2022-12-26 DIAGNOSIS — I110 Hypertensive heart disease with heart failure: Secondary | ICD-10-CM | POA: Insufficient documentation

## 2022-12-26 DIAGNOSIS — D649 Anemia, unspecified: Principal | ICD-10-CM | POA: Diagnosis present

## 2022-12-26 DIAGNOSIS — I2510 Atherosclerotic heart disease of native coronary artery without angina pectoris: Secondary | ICD-10-CM | POA: Diagnosis not present

## 2022-12-26 DIAGNOSIS — Z79899 Other long term (current) drug therapy: Secondary | ICD-10-CM | POA: Diagnosis not present

## 2022-12-26 DIAGNOSIS — R0602 Shortness of breath: Secondary | ICD-10-CM | POA: Diagnosis not present

## 2022-12-26 DIAGNOSIS — R531 Weakness: Secondary | ICD-10-CM | POA: Diagnosis not present

## 2022-12-26 LAB — IRON AND TIBC
Iron: 5 ug/dL — ABNORMAL LOW (ref 45–182)
Saturation Ratios: 1 % — ABNORMAL LOW (ref 17.9–39.5)
TIBC: 441 ug/dL (ref 250–450)
UIBC: 436 ug/dL

## 2022-12-26 LAB — COMPREHENSIVE METABOLIC PANEL
ALT: 21 U/L (ref 0–44)
AST: 23 U/L (ref 15–41)
Albumin: 3.5 g/dL (ref 3.5–5.0)
Alkaline Phosphatase: 66 U/L (ref 38–126)
Anion gap: 10 (ref 5–15)
BUN: 16 mg/dL (ref 8–23)
CO2: 28 mmol/L (ref 22–32)
Calcium: 8.8 mg/dL — ABNORMAL LOW (ref 8.9–10.3)
Chloride: 100 mmol/L (ref 98–111)
Creatinine, Ser: 1.18 mg/dL (ref 0.61–1.24)
GFR, Estimated: >60 mL/min (ref >60)
Glucose, Bld: 172 mg/dL — ABNORMAL HIGH (ref 70–99)
Potassium: 3.7 mmol/L (ref 3.5–5.1)
Sodium: 138 mmol/L (ref 135–145)
Total Bilirubin: 0.4 mg/dL (ref 0.3–1.2)
Total Protein: 6.5 g/dL (ref 6.5–8.1)

## 2022-12-26 LAB — I-STAT VENOUS BLOOD GAS, ED
Acid-Base Excess: 5 mmol/L — ABNORMAL HIGH (ref 0.0–2.0)
Bicarbonate: 28.6 mmol/L — ABNORMAL HIGH (ref 20.0–28.0)
Calcium, Ion: 1.07 mmol/L — ABNORMAL LOW (ref 1.15–1.40)
HCT: 23 % — ABNORMAL LOW (ref 39.0–52.0)
Hemoglobin: 7.8 g/dL — ABNORMAL LOW (ref 13.0–17.0)
O2 Saturation: 77 %
Potassium: 3.7 mmol/L (ref 3.5–5.1)
Sodium: 140 mmol/L (ref 135–145)
TCO2: 30 mmol/L (ref 22–32)
pCO2, Ven: 39.3 mmHg — ABNORMAL LOW (ref 44–60)
pH, Ven: 7.47 — ABNORMAL HIGH (ref 7.25–7.43)
pO2, Ven: 39 mmHg (ref 32–45)

## 2022-12-26 LAB — FOLATE: Folate: 26 ng/mL (ref >5.9)

## 2022-12-26 LAB — MAGNESIUM: Magnesium: 1.7 mg/dL (ref 1.7–2.4)

## 2022-12-26 LAB — CBC WITH DIFFERENTIAL/PLATELET
Abs Immature Granulocytes: 0.04 K/uL (ref 0.00–0.07)
Basophils Absolute: 0.1 K/uL (ref 0.0–0.1)
Basophils Relative: 1 %
Eosinophils Absolute: 0.1 K/uL (ref 0.0–0.5)
Eosinophils Relative: 1 %
HCT: 22.1 % — ABNORMAL LOW (ref 39.0–52.0)
Hemoglobin: 5.9 g/dL — CL (ref 13.0–17.0)
Immature Granulocytes: 1 %
Lymphocytes Relative: 13 %
Lymphs Abs: 1.1 K/uL (ref 0.7–4.0)
MCH: 22.2 pg — ABNORMAL LOW (ref 26.0–34.0)
MCHC: 26.7 g/dL — ABNORMAL LOW (ref 30.0–36.0)
MCV: 83.1 fL (ref 80.0–100.0)
Monocytes Absolute: 0.7 K/uL (ref 0.1–1.0)
Monocytes Relative: 8 %
Neutro Abs: 6.5 K/uL (ref 1.7–7.7)
Neutrophils Relative %: 76 %
Platelets: 417 K/uL — ABNORMAL HIGH (ref 150–400)
RBC: 2.66 MIL/uL — ABNORMAL LOW (ref 4.22–5.81)
RDW: 20.7 % — ABNORMAL HIGH (ref 11.5–15.5)
WBC: 8.5 K/uL (ref 4.0–10.5)
nRBC: 1.5 % — ABNORMAL HIGH (ref 0.0–0.2)

## 2022-12-26 LAB — HIV ANTIBODY (ROUTINE TESTING W REFLEX): HIV Screen 4th Generation wRfx: NONREACTIVE

## 2022-12-26 LAB — FERRITIN: Ferritin: 3 ng/mL — ABNORMAL LOW (ref 24–336)

## 2022-12-26 LAB — RETICULOCYTES
Immature Retic Fract: 10.9 % (ref 2.3–15.9)
RBC.: 2.61 MIL/uL — ABNORMAL LOW (ref 4.22–5.81)
Retic Count, Absolute: 31.3 10*3/uL (ref 19.0–186.0)
Retic Ct Pct: 1.2 % (ref 0.4–3.1)

## 2022-12-26 LAB — POC OCCULT BLOOD, ED: Fecal Occult Bld: NEGATIVE

## 2022-12-26 LAB — RESP PANEL BY RT-PCR (RSV, FLU A&B, COVID)  RVPGX2
Influenza A by PCR: NEGATIVE
Influenza B by PCR: NEGATIVE
Resp Syncytial Virus by PCR: NEGATIVE
SARS Coronavirus 2 by RT PCR: NEGATIVE

## 2022-12-26 LAB — VITAMIN B12: Vitamin B-12: 718 pg/mL (ref 180–914)

## 2022-12-26 LAB — TROPONIN I (HIGH SENSITIVITY)
Troponin I (High Sensitivity): 27 ng/L — ABNORMAL HIGH (ref <18)
Troponin I (High Sensitivity): 23 ng/L — ABNORMAL HIGH (ref <18)

## 2022-12-26 LAB — BRAIN NATRIURETIC PEPTIDE: B Natriuretic Peptide: 160.5 pg/mL — ABNORMAL HIGH (ref 0.0–100.0)

## 2022-12-26 LAB — PREPARE RBC (CROSSMATCH)

## 2022-12-26 MED ORDER — ATORVASTATIN CALCIUM 80 MG PO TABS
80.0000 mg | ORAL_TABLET | Freq: Every day | ORAL | Status: DC
  Administered 2022-12-27 – 2022-12-28 (×2): 80 mg via ORAL
  Filled 2022-12-26 (×2): qty 1

## 2022-12-26 MED ORDER — SENNOSIDES-DOCUSATE SODIUM 8.6-50 MG PO TABS: 1.0000 | ORAL_TABLET | Freq: Every evening | ORAL | Status: DC | PRN

## 2022-12-26 MED ORDER — IPRATROPIUM-ALBUTEROL 0.5-2.5 (3) MG/3ML IN SOLN: 3.0000 mL | RESPIRATORY_TRACT | Status: DC | PRN

## 2022-12-26 MED ORDER — ACETAMINOPHEN 325 MG PO TABS
650.0000 mg | ORAL_TABLET | Freq: Four times a day (QID) | ORAL | Status: DC | PRN
  Administered 2022-12-26 – 2022-12-27 (×2): 650 mg via ORAL
  Filled 2022-12-26 (×2): qty 2

## 2022-12-26 MED ORDER — ALBUTEROL SULFATE (2.5 MG/3ML) 0.083% IN NEBU
10.0000 mg/h | INHALATION_SOLUTION | Freq: Once | RESPIRATORY_TRACT | Status: AC
  Administered 2022-12-26: 10 mg/h via RESPIRATORY_TRACT
  Filled 2022-12-26: qty 3

## 2022-12-26 MED ORDER — GABAPENTIN 300 MG PO CAPS
300.0000 mg | ORAL_CAPSULE | Freq: Two times a day (BID) | ORAL | Status: DC
  Administered 2022-12-26 – 2022-12-28 (×4): 300 mg via ORAL
  Filled 2022-12-26 (×4): qty 1

## 2022-12-26 MED ORDER — FOLIC ACID 1 MG PO TABS: 1.0000 mg | ORAL_TABLET | Freq: Every day | ORAL | Status: DC

## 2022-12-26 MED ORDER — ACETAMINOPHEN 650 MG RE SUPP: 650.0000 mg | Freq: Four times a day (QID) | RECTAL | Status: DC | PRN

## 2022-12-26 MED ORDER — SODIUM CHLORIDE 0.9% IV SOLUTION: Freq: Once | INTRAVENOUS | Status: AC

## 2022-12-26 MED ORDER — PANTOPRAZOLE SODIUM 40 MG PO TBEC: 40.0000 mg | DELAYED_RELEASE_TABLET | Freq: Every day | ORAL | Status: DC

## 2022-12-26 MED ORDER — FLUTICASONE FUROATE-VILANTEROL 200-25 MCG/ACT IN AEPB
1.0000 | INHALATION_SPRAY | Freq: Every day | RESPIRATORY_TRACT | Status: DC
  Filled 2022-12-26: qty 28

## 2022-12-26 MED ORDER — IPRATROPIUM-ALBUTEROL 0.5-2.5 (3) MG/3ML IN SOLN
3.0000 mL | Freq: Four times a day (QID) | RESPIRATORY_TRACT | Status: DC | PRN
  Administered 2022-12-27: 3 mL via RESPIRATORY_TRACT

## 2022-12-26 MED ORDER — ALBUTEROL SULFATE (2.5 MG/3ML) 0.083% IN NEBU
3.0000 mL | INHALATION_SOLUTION | Freq: Four times a day (QID) | RESPIRATORY_TRACT | Status: DC | PRN
  Administered 2022-12-27: 3 mL via RESPIRATORY_TRACT
  Filled 2022-12-26: qty 3

## 2022-12-26 MED ORDER — UMECLIDINIUM BROMIDE 62.5 MCG/ACT IN AEPB
1.0000 | INHALATION_SPRAY | Freq: Every day | RESPIRATORY_TRACT | Status: DC
  Filled 2022-12-26: qty 7

## 2022-12-26 MED ORDER — IPRATROPIUM-ALBUTEROL 0.5-2.5 (3) MG/3ML IN SOLN
3.0000 mL | RESPIRATORY_TRACT | Status: DC
  Administered 2022-12-26: 3 mL via RESPIRATORY_TRACT
  Filled 2022-12-26: qty 3

## 2022-12-26 MED ORDER — ERGOCALCIFEROL 1.25 MG (50000 UT) PO CAPS: 50000.0000 [IU] | ORAL_CAPSULE | ORAL | Status: DC

## 2022-12-26 MED ORDER — BUDESON-GLYCOPYRROL-FORMOTEROL 160-9-4.8 MCG/ACT IN AERO: 2.0000 | INHALATION_SPRAY | Freq: Two times a day (BID) | RESPIRATORY_TRACT | Status: DC

## 2022-12-26 MED ORDER — METHYLPREDNISOLONE SODIUM SUCC 125 MG IJ SOLR
125.0000 mg | Freq: Once | INTRAMUSCULAR | Status: AC
  Administered 2022-12-26: 125 mg via INTRAVENOUS
  Filled 2022-12-26: qty 2

## 2022-12-26 MED ORDER — IPRATROPIUM-ALBUTEROL 0.5-2.5 (3) MG/3ML IN SOLN
3.0000 mL | Freq: Once | RESPIRATORY_TRACT | Status: AC
  Administered 2022-12-26: 3 mL via RESPIRATORY_TRACT
  Filled 2022-12-26: qty 3

## 2022-12-26 MED ORDER — EZETIMIBE 10 MG PO TABS
10.0000 mg | ORAL_TABLET | Freq: Every day | ORAL | Status: DC
  Administered 2022-12-27 – 2022-12-28 (×2): 10 mg via ORAL
  Filled 2022-12-26 (×2): qty 1

## 2022-12-26 MED ORDER — PANTOPRAZOLE SODIUM 40 MG IV SOLR
40.0000 mg | Freq: Two times a day (BID) | INTRAVENOUS | Status: DC
  Administered 2022-12-26 – 2022-12-27 (×3): 40 mg via INTRAVENOUS
  Filled 2022-12-26 (×3): qty 10

## 2022-12-26 MED ORDER — FERROUS SULFATE 325 (65 FE) MG PO TABS: 325.0000 mg | ORAL_TABLET | Freq: Every day | ORAL | Status: DC

## 2022-12-26 MED ORDER — ACETAMINOPHEN 500 MG PO TABS: 1000.0000 mg | ORAL_TABLET | Freq: Four times a day (QID) | ORAL | Status: DC | PRN

## 2022-12-26 NOTE — ED Notes (Signed)
ED TO INPATIENT HANDOFF REPORT  ED Nurse Name and Phone #: Leavy Cella [phone]  S Name/Age/Gender Jared [name] 67 y.o. male Room/Bed: 011C/011C  Code Status   Code Status: Full Code  Home/SNF/Other Boarding Home Patient oriented to: self, place, time, and situation Is this baseline? Yes   Triage Complete: Triage complete  Chief Complaint Symptomatic anemia [D64.9]  Triage Note Pt arrives POV with complaints of weakness and shob x 2-3 days. Pt states last time this happened he needed a blood transfusion. Pt denies and dark stools or bleeding. Ambulatory with assistance, aox4, resp labored. 2LNC at baseline    Allergies Allergies  Allergen Reactions   Lisinopril Cough    Level of Care/Admitting Diagnosis ED Disposition     ED Disposition  Admit   Condition  --   Comment  Hospital Area: [facility] [[id number]]  Level of Care: Telemetry Medical [[id number]]  May admit patient to [facility] or [facility] if equivalent level of care is available:: No  Covid Evaluation: Asymptomatic - no recent exposure (last 10 days) testing not required  Diagnosis: Symptomatic anemia [[id number]]  Admitting Physician: Dickie La [[id number]]  Attending Physician: Dickie La [[id number]]  Certification:: I certify this patient will need inpatient services for at least 2 midnights  Estimated Length of Stay: 3          B Medical/Surgery History Past Medical History:  Diagnosis Date   AVM (arteriovenous malformation)    CAD (coronary artery disease)    a. LHC 5/12:  LAD 20, pLCx 20, pRCA 40, dRCA 40, EF 35%, diff HK  //  b. Myoview 4/16: Overall Impression:  High risk stress nuclear study There is no evidence of ischemia.  There is severe LV dysfunction. LV Ejection Fraction: 30%.  LV Wall Motion:  There is global LV hypokinesis.     CAP (community acquired pneumonia) 09/2013   Chronic combined systolic and diastolic CHF (congestive heart failure) (HCC)    a. Echo 4/16:Mild LVH, EF  40-45%, diffuse HK //  b. Echo 8/17: EF 35-40%, diffuse HK, diastolic dysfunction, aortic sclerosis, trivial MR, moderate LAE, normal RVSF, moderate RAE, mild TR, PASP 42 mmHg // c. Echo 4/18: Mild concentric LVH, EF 30-35, normal wall motion, grade 1 diastolic dysfunction, PASP 49   Chronic respiratory failure (HCC)    Cluster headache    "hx; haven't had one in awhile" (01/09/2016)   COPD (chronic obstructive pulmonary disease) (HCC)    Jared [name] 01/09/2016   DM (diabetes mellitus) (HCC)    History of CVA (cerebrovascular accident)    Hypertension    IDA (iron deficiency anemia)    Moderate tobacco use disorder    NICM (nonischemic cardiomyopathy) (HCC)    Nicotine addiction    Tobacco abuse    Type 2 diabetes mellitus (HCC) 05/14/2016   Past Surgical History:  Procedure Laterality Date   ABDOMINAL AORTOGRAM W/LOWER EXTREMITY N/A 12/07/2022   Procedure: ABDOMINAL AORTOGRAM W/LOWER EXTREMITY;  Surgeon: Leonie Douglas, MD;  [facility];  Service: Cardiovascular;  Laterality: N/A;   BIOPSY  11/12/2020   Procedure: BIOPSY;  Surgeon: Lemar Lofty., MD;  Location: [facility] ENDOSCOPY;  Service: Gastroenterology;;   CARDIAC CATHETERIZATION  10/2010   LM normal, LAD with 20% irregularities, LCX with 20%, RCA with 40% prox and 40% distal - EF of 35%   CATARACT EXTRACTION, BILATERAL     COLONOSCOPY W/ BIOPSIES AND POLYPECTOMY     COLONOSCOPY WITH PROPOFOL N/A 09/06/2018  Procedure: COLONOSCOPY WITH PROPOFOL;  Surgeon: Tressia Danas, MD;  Location: [facility] ENDOSCOPY;  Service: Gastroenterology;  Laterality: N/A;   ENTEROSCOPY N/A 09/28/2018   Procedure: ENTEROSCOPY;  Surgeon: Jeani Hawking, MD;  Location: [facility] ENDOSCOPY;  Service: Endoscopy;  Laterality: N/A;   ENTEROSCOPY N/A 10/28/2018   Procedure: ENTEROSCOPY;  Surgeon: Tressia Danas, MD;  Location: [facility] ENDOSCOPY;  Service: Gastroenterology;  Laterality: N/A;   ENTEROSCOPY N/A 10/09/2020   Procedure: ENTEROSCOPY;  Surgeon: Hilarie Fredrickson, MD;  Location: [facility] ENDOSCOPY;  Service: Endoscopy;  Laterality: N/A;   ENTEROSCOPY N/A 03/22/2022   Procedure: ENTEROSCOPY;  Surgeon: Benancio Deeds, MD;  Location: [facility] ENDOSCOPY;  Service: Gastroenterology;  Laterality: N/A;   ESOPHAGOGASTRODUODENOSCOPY N/A 11/12/2020   Procedure: ESOPHAGOGASTRODUODENOSCOPY (EGD);  Surgeon: Lemar Lofty., MD;  Location: [facility] ENDOSCOPY;  Service: Gastroenterology;  Laterality: N/A;   ESOPHAGOGASTRODUODENOSCOPY (EGD) WITH PROPOFOL N/A 09/05/2018   Procedure: ESOPHAGOGASTRODUODENOSCOPY (EGD) WITH PROPOFOL;  Surgeon: Benancio Deeds, MD;  Location: [facility] ENDOSCOPY;  Service: Gastroenterology;  Laterality: N/A;   ESOPHAGOGASTRODUODENOSCOPY (EGD) WITH PROPOFOL N/A 11/19/2020   Procedure: ESOPHAGOGASTRODUODENOSCOPY (EGD) WITH PROPOFOL;  Surgeon: Beverley Fiedler, MD;  Location: [facility] ENDOSCOPY;  Service: Gastroenterology;  Laterality: N/A;   EXCISION MASS HEAD     GIVENS CAPSULE STUDY N/A 09/06/2018   Procedure: GIVENS CAPSULE STUDY;  Surgeon: Tressia Danas, MD;  Location: [facility] ENDOSCOPY;  Service: Gastroenterology;  Laterality: N/A;   GIVENS CAPSULE STUDY N/A 09/26/2018   Procedure: GIVENS CAPSULE STUDY;  Surgeon: Beverley Fiedler, MD;  Location: [facility] ENDOSCOPY;  Service: Gastroenterology;  Laterality: N/A;   GIVENS CAPSULE STUDY N/A 06/14/2019   Procedure: GIVENS CAPSULE STUDY;  Surgeon: Napoleon Form, MD;  Location[facility] ENDOSCOPY;  Service: Endoscopy;  Laterality: N/A;   HEMOSTASIS CLIP PLACEMENT  11/12/2020   Procedure: HEMOSTASIS CLIP PLACEMENT;  Surgeon: Lemar Lofty., MD;  Location: [facility] ENDOSCOPY;  Service: Gastroenterology;;   HEMOSTASIS CONTROL  11/12/2020   Procedure: HEMOSTASIS CONTROL;  Surgeon: Lemar Lofty., MD;  Location: [facility] ENDOSCOPY;  Service: Gastroenterology;;   HOT HEMOSTASIS N/A 10/28/2018   Procedure: HOT HEMOSTASIS (ARGON PLASMA COAGULATION/BICAP);  Surgeon: Tressia Danas, MD;  Location: [facility] ENDOSCOPY;  Service:  Gastroenterology;  Laterality: N/A;   HOT HEMOSTASIS N/A 11/12/2020   Procedure: HOT HEMOSTASIS (ARGON PLASMA COAGULATION/BICAP);  Surgeon: Lemar Lofty., MD;  Location: [facility] ENDOSCOPY;  Service: Gastroenterology;  Laterality: N/A;   HOT HEMOSTASIS N/A 03/22/2022   Procedure: HOT HEMOSTASIS (ARGON PLASMA COAGULATION/BICAP);  Surgeon: Benancio Deeds, MD;  Location: [facility] ENDOSCOPY;  Service: Gastroenterology;  Laterality: N/A;   INCISION AND DRAINAGE PERIRECTAL ABSCESS N/A 06/05/2017   Procedure: IRRIGATION AND DEBRIDEMENT PERIRECTAL ABSCESS;  Surgeon: Andria Meuse, MD;  Location: [facility] OR;  Service: General;  Laterality: N/A;   PERIPHERAL VASCULAR INTERVENTION  12/07/2022   Procedure: PERIPHERAL VASCULAR INTERVENTION;  Surgeon: Leonie Douglas, MD;  [facility];  Service: Cardiovascular;;   SUBMUCOSAL TATTOO INJECTION  11/12/2020   Procedure: SUBMUCOSAL TATTOO INJECTION;  Surgeon: Lemar Lofty., MD;  Location: [facility] ENDOSCOPY;  Service: Gastroenterology;;   VIDEO BRONCHOSCOPY Bilateral 05/08/2016   Procedure: VIDEO BRONCHOSCOPY WITH FLUORO;  Surgeon: Oretha Milch, MD;  Location: [facility] ENDOSCOPY;  Service: Cardiopulmonary;  Laterality: Bilateral;     A IV Location/Drains/Wounds Patient Lines/Drains/Airways Status     Active Line/Drains/Airways     Name Placement date Placement time Site Days   Peripheral IV 12/26/22 20 G Anterior;Right;Upper Arm 12/26/22  0938  Arm  less than 1  Intake/Output Last 24 hours No intake or output data in the 24 hours ending 12/26/22 1406  Labs/Imaging Results for orders placed or performed during the hospital encounter of 12/26/22 (from the past 48 hour(s))  Resp panel by RT-PCR (RSV, Flu A&B, Covid) Anterior Nasal Swab     Status: None   Collection Time: 12/26/22  9:33 AM   Specimen: Anterior Nasal Swab  Result Value Ref Range   SARS Coronavirus 2 by RT PCR NEGATIVE NEGATIVE   Influenza A by PCR NEGATIVE  NEGATIVE   Influenza B by PCR NEGATIVE NEGATIVE    Comment: (NOTE) The Xpert Xpress SARS-CoV-2/FLU/RSV plus assay is intended as an aid in the diagnosis of influenza from Nasopharyngeal swab specimens and should not be used as a sole basis for treatment. Nasal washings and aspirates are unacceptable for Xpert Xpress SARS-CoV-2/FLU/RSV testing.  Fact Sheet for Patients: BloggerCourse.com  Fact Sheet for Healthcare Providers: SeriousBroker.it  This test is not yet approved or cleared by the [place] FDA and has been authorized for detection and/or diagnosis of SARS-CoV-2 by FDA under an Emergency Use Authorization (EUA). This EUA will remain in effect (meaning this test can be used) for the duration of the COVID-19 declaration under Section 564(b)(1) of the Act, 21 U.S.C. section 360bbb-3(b)(1), unless the authorization is terminated or revoked.     Resp Syncytial Virus by PCR NEGATIVE NEGATIVE    Comment: (NOTE) Fact Sheet for Patients: BloggerCourse.com  Fact Sheet for Healthcare Providers: SeriousBroker.it  This test is not yet approved or cleared by the [place] FDA and has been authorized for detection and/or diagnosis of SARS-CoV-2 by FDA under an Emergency Use Authorization (EUA). This EUA will remain in effect (meaning this test can be used) for the duration of the COVID-19 declaration under Section 564(b)(1) of the Act, 21 U.S.C. section 360bbb-3(b)(1), unless the authorization is terminated or revoked.  Performed at [facility], [address] [place], [place] [address]   Comprehensive metabolic panel     Status: Abnormal   Collection Time: 12/26/22  9:39 AM  Result Value Ref Range   Sodium 138 135 - 145 mmol/L   Potassium 3.7 3.5 - 5.1 mmol/L   Chloride 100 98 - 111 mmol/L   CO2 28 22 - 32 mmol/L   Glucose, Bld 172 (H) 70 - 99 mg/dL     Comment: Glucose reference range applies only to samples taken after fasting for at least 8 hours.   BUN 16 8 - 23 mg/dL   Creatinine, Ser 6.04 0.61 - 1.24 mg/dL   Calcium 8.8 (L) 8.9 - 10.3 mg/dL   Total Protein 6.5 6.5 - 8.1 g/dL   Albumin 3.5 3.5 - 5.0 g/dL   AST 23 15 - 41 U/L   ALT 21 0 - 44 U/L   Alkaline Phosphatase 66 38 - 126 U/L   Total Bilirubin 0.4 0.3 - 1.2 mg/dL   GFR, Estimated >54 >09 mL/min    Comment: (NOTE) Calculated using the CKD-EPI Creatinine Equation (2021)    Anion gap 10 5 - 15    Comment: Performed at [facility], [address], [place], [place] [address]  Magnesium     Status: None   Collection Time: 12/26/22  9:39 AM  Result Value Ref Range   Magnesium 1.7 1.7 - 2.4 mg/dL    Comment: Performed at [facility], [address] [place], [place] [address]  CBC with Differential/Platelet     Status:  Abnormal   Collection Time: 12/26/22  9:39 AM  Result Value Ref Range   WBC 8.5 4.0 - 10.5 K/uL   RBC 2.66 (L) 4.22 - 5.81 MIL/uL   Hemoglobin 5.9 (LL) 13.0 - 17.0 g/dL    Comment: REPEATED TO VERIFY THIS CRITICAL RESULT HAS VERIFIED AND BEEN CALLED TO J Zareth Rippetoe RN BY GLENDA GANADEN ON 07 17 2024 AT 1022, AND HAS BEEN READ BACK.     HCT 22.1 (L) 39.0 - 52.0 %   MCV 83.1 80.0 - 100.0 fL   MCH 22.2 (L) 26.0 - 34.0 pg   MCHC 26.7 (L) 30.0 - 36.0 g/dL   RDW 16.1 (H) 09.6 - 04.5 %   Platelets 417 (H) 150 - 400 K/uL   nRBC 1.5 (H) 0.0 - 0.2 %   Neutrophils Relative % 76 %   Neutro Abs 6.5 1.7 - 7.7 K/uL   Lymphocytes Relative 13 %   Lymphs Abs 1.1 0.7 - 4.0 K/uL   Monocytes Relative 8 %   Monocytes Absolute 0.7 0.1 - 1.0 K/uL   Eosinophils Relative 1 %   Eosinophils Absolute 0.1 0.0 - 0.5 K/uL   Basophils Relative 1 %   Basophils Absolute 0.1 0.0 - 0.1 K/uL   Immature Granulocytes 1 %   Abs Immature Granulocytes 0.04 0.00 - 0.07 K/uL    Comment: Performed at [facility], [address], [place], [place] [address]  Brain natriuretic  peptide     Status: Abnormal   Collection Time: 12/26/22  9:39 AM  Result Value Ref Range   B Natriuretic Peptide 160.5 (H) 0.0 - 100.0 pg/mL    Comment: Performed at [facility], [address], [place], [place] [address]  Troponin I (High Sensitivity)     Status: Abnormal   Collection Time: 12/26/22  9:39 AM  Result Value Ref Range   Troponin I (High Sensitivity) 27 (H) <18 ng/L    Comment: (NOTE) Elevated high sensitivity troponin I (hsTnI) values and significant  changes across serial measurements may suggest ACS but many other  chronic and acute conditions are known to elevate hsTnI results.  Refer to the "Links" section for chest pain algorithms and additional  guidance. Performed at [facility], [address], [place], [place] [address]   Type and screen [facility]     Status: None (Preliminary result)   Collection Time: 12/26/22  9:39 AM  Result Value Ref Range   ABO/RH(D) O POS    Antibody Screen NEG    Sample Expiration 12/29/2022,2359    Unit Number [id number]    Blood Component Type RED CELLS,LR    Unit division 00    Status of Unit ISSUED    Transfusion Status OK TO TRANSFUSE    Crossmatch Result      Compatible Performed at [facility], [address], [place], [place] [address]   Prepare RBC (crossmatch)     Status: None   Collection Time: 12/26/22  9:39 AM  Result Value Ref Range   Order Confirmation      ORDER PROCESSED BY BLOOD BANK Performed at [facility], [address] [place], [place] [address]   I-Stat venous blood gas, ED     Status: Abnormal   Collection Time: 12/26/22  9:47 AM  Result Value Ref Range   pH, Ven 7.470 (H) 7.25 - 7.43   pCO2, Ven 39.3 (L) 44 - 60 mmHg   pO2,  Ven 39 32 - 45 mmHg   Bicarbonate 28.6 (H) 20.0 - 28.0 mmol/L   TCO2 30 22 - 32 mmol/L   O2 Saturation 77 %   Acid-Base Excess 5.0 (H) 0.0 - 2.0 mmol/L   Sodium 140 135 - 145 mmol/L   Potassium 3.7 3.5 - 5.1 mmol/L    Calcium, Ion 1.07 (L) 1.15 - 1.40 mmol/L   HCT 23.0 (L) 39.0 - 52.0 %   Hemoglobin 7.8 (L) 13.0 - 17.0 g/dL   Sample type VENOUS    Comment NOTIFIED PHYSICIAN   POC occult blood, ED     Status: None   Collection Time: 12/26/22 11:04 AM  Result Value Ref Range   Fecal Occult Bld NEGATIVE NEGATIVE  Reticulocytes     Status: Abnormal   Collection Time: 12/26/22 12:51 PM  Result Value Ref Range   Retic Ct Pct 1.2 0.4 - 3.1 %   RBC. 2.61 (L) 4.22 - 5.81 MIL/uL   Retic Count, Absolute 31.3 19.0 - 186.0 K/uL   Immature Retic Fract 10.9 2.3 - 15.9 %    Comment: Performed at [facility], [address] [place], [place] [address]   DG Chest Port 1 View  Result Date: 12/26/2022 CLINICAL DATA:  Shortness of breath and weakness for the past 2 days. EXAM: PORTABLE CHEST 1 VIEW COMPARISON:  Chest x-ray dated March 21, 2022. FINDINGS: The heart size and mediastinal contours are within normal limits. Both lungs are clear. No acute osseous abnormality. Old left-sided rib fractures again noted. IMPRESSION: No active disease. Electronically Signed   By: Obie Dredge M.D.   On: 12/26/2022 10:17    Pending Labs Unresulted Labs (From admission, onward)     Start     Ordered   12/27/22 0500  CBC  Tomorrow morning,   R        12/26/22 1348   12/27/22 0500  Basic metabolic panel  Tomorrow morning,   R        12/26/22 1348   12/26/22 1345  HIV Antibody (routine testing w rflx)  (HIV Antibody (Routine testing w reflex) panel)  Once,   R        12/26/22 1348   12/26/22 1251  Ferritin  (Anemia Panel (PNL))  Once,   URGENT        12/26/22 1251   12/26/22 1251  Folate  (Anemia Panel (PNL))  Once,   URGENT        12/26/22 1251   12/26/22 1251  Iron and TIBC  (Anemia Panel (PNL))  Once,   URGENT        12/26/22 1251   12/26/22 1251  Vitamin B12  (Anemia Panel (PNL))  Once,   URGENT        12/26/22 1251            Vitals/Pain Today's Vitals   12/26/22 1315 12/26/22 1330 12/26/22 1340  12/26/22 1354  BP: 139/73 136/75  134/70  Pulse: 84 85 83 86  Resp:  17 (!) 21 16  Temp:   97.7 F (36.5 C) 97.6 F (36.4 C)  TempSrc:   Oral Oral  SpO2: 98% 97% 97% 98%  Weight:      Height:      PainSc:        Isolation Precautions No active isolations  Medications Medications  pantoprazole (PROTONIX) injection 40 mg (40 mg Intravenous Given 12/26/22 1037)  0.9 %  sodium chloride infusion (Manually program via Guardrails IV Fluids) (  has no administration in time range)  acetaminophen (TYLENOL) tablet 650 mg (has no administration in time range)    Or  acetaminophen (TYLENOL) suppository 650 mg (has no administration in time range)  senna-docusate (Senokot-S) tablet 1 tablet (has no administration in time range)  ipratropium-albuterol (DUONEB) 0.5-2.5 (3) MG/3ML nebulizer solution 3 mL (has no administration in time range)  methylPREDNISolone sodium succinate (SOLU-MEDROL) 125 mg/2 mL injection 125 mg (125 mg Intravenous Given 12/26/22 0947)  ipratropium-albuterol (DUONEB) 0.5-2.5 (3) MG/3ML nebulizer solution 3 mL (3 mLs Nebulization Given 12/26/22 0947)  albuterol (PROVENTIL) (2.5 MG/3ML) 0.083% nebulizer solution (10 mg/hr Nebulization Given 12/26/22 0947)  albuterol (PROVENTIL) (2.5 MG/3ML) 0.083% nebulizer solution (10 mg/hr Nebulization Given 12/26/22 1037)    Mobility walks with device     Focused Assessments Cardiac Assessment Handoff:    Lab Results  Component Value Date   CKTOTAL 82 03/21/2022   TROPONINI <0.03 11/09/2018   Lab Results  Component Value Date   DDIMER 1.55 (H) 02/20/2016   Does the Patient currently have chest pain? No   , Neuro Assessment Handoff:  Swallow screen pass? Yes          Neuro Assessment: Within Defined Limits Neuro Checks:      Has TPA been given? No If patient is a Neuro Trauma and patient is going to OR before floor call report to 4N Charge nurse: [phone] or [phone]  , Pulmonary Assessment Handoff:  Lung  sounds:   O2 Device: Nasal Cannula O2 Flow Rate (L/min): 2 L/min    R Recommendations: See Admitting Provider Note  Report given to:   Additional Notes: Blood transfusing, initiated at 1400 @ 75cc/hr

## 2022-12-26 NOTE — ED Provider Notes (Signed)
[place] EMERGENCY DEPARTMENT AT [facility] Provider Note   CSN: [id number] Arrival date & time: 12/26/22  1191     History  Chief Complaint  Patient presents with   Shortness of Breath    Jared [name] is a 67 y.o. male.  HPI Patient presents for shortness of breath, fatigue, generalized weakness.  Medical history includes COPD, anemia, HTN, CAD, DM, atrial flutter, PUD, PVD.  He underwent left femoropopliteal stenting 3 weeks ago.  He has resumed his Xarelto.  He has had improved pain in his left leg.  Over the past 2 to 3 days, patient has had shortness of breath, worsened with exertion, fatigue, and generalized weakness.  He denies any areas of pain.  He has not had any known blood loss or dark stools.  He has not been using home breathing treatments because the medication has expired.    Home Medications Prior to Admission medications   Medication Sig Start Date End Date Taking? Authorizing Provider  Accu-Chek Softclix Lancets lancets Use as instructed daily before meals 12/18/21   [name], MD  acetaminophen (TYLENOL) 500 MG tablet Take 500-1,000 mg by mouth every 6 (six) hours as needed (pain.).    [provider]  albuterol (VENTOLIN HFA) 108 (90 Base) MCG/ACT inhaler INHALE 2 PUFFS INTO THE LUNGS EVERY 6 (SIX) HOURS AS NEEDED FOR WHEEZING OR SHORTNESS OF BREATH. 12/06/22   [name], MD  aspirin EC 81 MG tablet Take 1 tablet (81 mg total) by mouth daily. Swallow whole. 12/07/22   Leonie Douglas, MD  Aspirin Effervescent (ALKA-SELTZER ORIGINAL PO) Take 1 packet by mouth 2 (two) times daily as needed (bloat).    [provider]  atorvastatin (LIPITOR) 80 MG tablet Take 1 tablet (80 mg total) by mouth daily. 10/25/22   [name], MD  Blood Glucose Monitoring Suppl (ACCU-CHEK GUIDE) w/Device KIT Use as directed three times daily 12/18/21   [name], MD  Budeson-Glycopyrrol-Formoterol (BREZTRI AEROSPHERE) 160-9-4.8 MCG/ACT AERO  Take 2 puffs first thing in morning and then another 2 puffs about 12 hours later. 08/06/22   Nyoka Cowden, MD  ergocalciferol (DRISDOL) 1.25 MG (50000 UT) capsule Take 1 capsule (50,000 Units total) by mouth once a week. Patient not taking: Reported on 12/04/2022 07/27/22   [name], MD  ezetimibe (ZETIA) 10 MG tablet Take 1 tablet (10 mg total) by mouth daily. Patient not taking: Reported on 12/04/2022 01/31/22   Pricilla Riffle, MD  ferrous sulfate 325 (65 FE) MG tablet Take 1 tablet (325 mg total) by mouth daily with breakfast. Patient not taking: Reported on 12/04/2022 04/24/22   Sherrilyn Rist, MD  folic acid (FOLVITE) 1 MG tablet Take 1 tablet (1 mg total) by mouth daily. Patient not taking: Reported on 12/04/2022 10/09/22   [name], MD  furosemide (LASIX) 40 MG tablet Take 1 tablet (40 mg total) by mouth 2 (two) times daily. 10/25/22   [name], MD  gabapentin (NEURONTIN) 300 MG capsule Take 1 capsule (300 mg total) by mouth 2 (two) times daily. 10/10/22   Kathryne Hitch, MD  glucose blood (ACCU-CHEK GUIDE) test strip Use as instructed daily 12/18/21   [name], MD  hydrALAZINE (APRESOLINE) 50 MG tablet Take 1 tablet (50 mg total) by mouth every 8 (eight) hours. Patient taking differently: Take 50 mg by mouth in the morning and at bedtime. 04/25/22   [name], MD  isosorbide mononitrate (IMDUR) 30 MG 24 hr tablet Take  1 tablet (30 mg total) by mouth daily. Patient not taking: Reported on 12/04/2022 04/25/22   [name], MD  metoprolol succinate (TOPROL-XL) 50 MG 24 hr tablet Take 1 tablet (50 mg total) by mouth daily. Take with or immediately following a meal. 07/26/22   [name], MD  nitroGLYCERIN (NITROSTAT) 0.4 MG SL tablet Place 1 tablet (0.4 mg total) under the tongue every 5 (five) minutes as needed for chest pain. Patient not taking: Reported on 12/04/2022 11/19/20 03/21/22  Albertine Grates, MD  OXYGEN Inhale 2 L/min into the lungs as  directed. On exertion or when walking    [provider]  pantoprazole (PROTONIX) 40 MG tablet Take 1 tablet (40 mg total) by mouth daily. 10/25/22   [name], MD  rivaroxaban (XARELTO) 20 MG TABS tablet Take 1 tablet (20 mg total) by mouth daily with supper. 10/25/22   [name], MD  terbinafine (LAMISIL AT ATHLETES FOOT) 1 % cream Apply 1 Application topically 2 (two) times daily. 10/25/22   [name], MD  tiZANidine (ZANAFLEX) 2 MG tablet Take 1 tablet (2 mg total) by mouth at bedtime. Patient not taking: Reported on 12/04/2022 07/12/22   Kirtland Bouchard, PA-C  TUDORZA PRESSAIR 400 MCG/ACT AEPB INHALE 1 PUFF INTO THE LUNGS IN THE MORNING AND AT BEDTIME. 03/29/20 04/04/20  Nyoka Cowden, MD      Allergies    Lisinopril    Review of Systems   Review of Systems  Constitutional:  Positive for activity change and fatigue.  Respiratory:  Positive for shortness of breath.   Neurological:  Positive for weakness (Generalized).  All other systems reviewed and are negative.   Physical Exam Updated Vital Signs BP 116/67 (BP Location: Right Arm)   Pulse (!) 105   Temp 98.9 F (37.2 C) (Oral)   Resp 20   Ht 6\' 3"  (1.905 m)   Wt 100 kg   SpO2 100%   BMI 27.56 kg/m  Physical Exam Vitals and nursing note reviewed.  Constitutional:      General: He is not in acute distress.    Appearance: He is well-developed. He is ill-appearing. He is not toxic-appearing or diaphoretic.  HENT:     Head: Normocephalic and atraumatic.     Mouth/Throat:     Mouth: Mucous membranes are moist.  Eyes:     Conjunctiva/sclera: Conjunctivae normal.  Cardiovascular:     Rate and Rhythm: Regular rhythm. Tachycardia present.     Heart sounds: No murmur heard. Pulmonary:     Effort: Pulmonary effort is normal. Tachypnea and prolonged expiration present. No respiratory distress.     Breath sounds: Decreased air movement present. Decreased breath sounds and wheezing present.  Chest:      Chest wall: No tenderness.  Abdominal:     Palpations: Abdomen is soft.     Tenderness: There is no abdominal tenderness.  Musculoskeletal:        General: No swelling. Normal range of motion.     Cervical back: Normal range of motion and neck supple.     Right lower leg: No edema.     Left lower leg: No edema.  Skin:    General: Skin is warm and dry.     Coloration: Skin is not cyanotic or pale.  Neurological:     General: No focal deficit present.     Mental Status: He is alert and oriented to person, place, and time.  Psychiatric:  Mood and Affect: Mood normal.        Behavior: Behavior normal.     ED Results / Procedures / Treatments   Labs (all labs ordered are listed, but only abnormal results are displayed) Labs Reviewed  COMPREHENSIVE METABOLIC PANEL - Abnormal; Notable for the following components:      Result Value   Glucose, Bld 172 (*)    Calcium 8.8 (*)    All other components within normal limits  CBC WITH DIFFERENTIAL/PLATELET - Abnormal; Notable for the following components:   RBC 2.66 (*)    Hemoglobin 5.9 (*)    HCT 22.1 (*)    MCH 22.2 (*)    MCHC 26.7 (*)    RDW 20.7 (*)    Platelets 417 (*)    nRBC 1.5 (*)    All other components within normal limits  BRAIN NATRIURETIC PEPTIDE - Abnormal; Notable for the following components:   B Natriuretic Peptide 160.5 (*)    All other components within normal limits  I-STAT VENOUS BLOOD GAS, ED - Abnormal; Notable for the following components:   pH, Ven 7.470 (*)    pCO2, Ven 39.3 (*)    Bicarbonate 28.6 (*)    Acid-Base Excess 5.0 (*)    Calcium, Ion 1.07 (*)    HCT 23.0 (*)    Hemoglobin 7.8 (*)    All other components within normal limits  TROPONIN I (HIGH SENSITIVITY) - Abnormal; Notable for the following components:   Troponin I (High Sensitivity) 27 (*)    All other components within normal limits  RESP PANEL BY RT-PCR (RSV, FLU A&B, COVID)  RVPGX2  MAGNESIUM  FERRITIN  FOLATE  IRON AND  TIBC  RETICULOCYTES  VITAMIN B12  POC OCCULT BLOOD, ED  TYPE AND SCREEN  PREPARE RBC (CROSSMATCH)  TROPONIN I (HIGH SENSITIVITY)    EKG EKG Interpretation Date/Time:  Wednesday December 26 2022 09:41:23 EDT Ventricular Rate:  100 PR Interval:  123 QRS Duration:  102 QT Interval:  356 QTC Calculation: 460 R Axis:   -67  Text Interpretation: Sinus or ectopic atrial tachycardia Atrial premature complexes Left anterior fascicular block Low voltage, extremity leads Confirmed by Gloris Manchester ([id number]) on 12/26/2022 9:45:45 AM  Radiology DG Chest Port 1 View  Result Date: 12/26/2022 CLINICAL DATA:  Shortness of breath and weakness for the past 2 days. EXAM: PORTABLE CHEST 1 VIEW COMPARISON:  Chest x-ray dated March 21, 2022. FINDINGS: The heart size and mediastinal contours are within normal limits. Both lungs are clear. No acute osseous abnormality. Old left-sided rib fractures again noted. IMPRESSION: No active disease. Electronically Signed   By: Obie Dredge M.D.   On: 12/26/2022 10:17    Procedures Procedures    Medications Ordered in ED Medications  pantoprazole (PROTONIX) injection 40 mg (40 mg Intravenous Given 12/26/22 1037)  0.9 %  sodium chloride infusion (Manually program via Guardrails IV Fluids) (has no administration in time range)  methylPREDNISolone sodium succinate (SOLU-MEDROL) 125 mg/2 mL injection 125 mg (125 mg Intravenous Given 12/26/22 0947)  ipratropium-albuterol (DUONEB) 0.5-2.5 (3) MG/3ML nebulizer solution 3 mL (3 mLs Nebulization Given 12/26/22 0947)  albuterol (PROVENTIL) (2.5 MG/3ML) 0.083% nebulizer solution (10 mg/hr Nebulization Given 12/26/22 0947)  albuterol (PROVENTIL) (2.5 MG/3ML) 0.083% nebulizer solution (10 mg/hr Nebulization Given 12/26/22 1037)    ED Course/ Medical Decision Making/ A&P  Medical Decision Making Amount and/or Complexity of Data Reviewed Labs: ordered. Radiology: ordered.  Risk Prescription drug  management.   This patient presents to the ED for concern of generalized weakness and shortness of breath, this involves an extensive number of treatment options, and is a complaint that carries with it a high risk of complications and morbidity.  The differential diagnosis includes COPD exacerbation, CHF, pneumonia, acidosis, anemia, other metabolic derangements   Co morbidities that complicate the patient evaluation  COPD, anemia, HTN, CAD, DM, atrial flutter, PUD, PVD   Additional history obtained:  Additional history obtained from N/A External records from outside source obtained and reviewed including EMR   Lab Tests:  I Ordered, and personally interpreted labs.  The pertinent results include: 3 g/dL drop in hemoglobin over the past 2 weeks; no leukocytosis, normal electrolytes, mildly elevated troponin and BNP.   Imaging Studies ordered:  I ordered imaging studies including chest x-ray I independently visualized and interpreted imaging which showed no acute findings I agree with the radiologist interpretation   Cardiac Monitoring: / EKG:  The patient was maintained on a cardiac monitor.  I personally viewed and interpreted the cardiac monitored which showed an underlying rhythm of: Sinus rhythm  Problem List / ED Course / Critical interventions / Medication management  Patient presenting for fatigue and shortness of breath that has been worsening over the past 2 to 3 days.  On arrival, he has increased work of breathing, prolonged expiration, decreased air movement on lung auscultation.  Presentation is consistent with COPD exacerbation.  COPD treatment was initiated.  Workup was initiated.  Per chart review, patient's last echocardiogram was a year ago.  At that time, he had systolic and diastolic dysfunction.  He is prescribed.  Lasix, 40 mg twice daily.  He is unsure if he takes this.  On bedside ultrasound, no B-lines are appreciated.  Labwork shows a hemoglobin of 5.9.   MCV is normal consistent with acute blood loss.  Patient is on Xarelto.  On DRE, no gross melena is present.  Internal hemorrhoid was palpated.  He does have a history of upper GI bleed and Protonix was ordered for empiric treatment of this.  He was consented for blood transfusion.  2 units PRBCs was ordered.  Hemoccult testing was negative.  Patient remained hemodynamically stable.  He was admitted to medicine for further management. I ordered medication including Solu-Medrol, DuoNeb, and albuterol for COPD exacerbation; PRBCs for symptomatic anemia; Protonix for empiric treatment of UGIB. Reevaluation of the patient after these medicines showed that the patient improved I have reviewed the patients home medicines and have made adjustments as needed   Social Determinants of Health:  Has access to outpatient care  CRITICAL CARE Performed by: Gloris Manchester   Total critical care time: 35 minutes  Critical care time was exclusive of separately billable procedures and treating other patients.  Critical care was necessary to treat or prevent imminent or life-threatening deterioration.  Critical care was time spent personally by me on the following activities: development of treatment plan with patient and/or surrogate as well as nursing, discussions with consultants, evaluation of patient's response to treatment, examination of patient, obtaining history from patient or surrogate, ordering and performing treatments and interventions, ordering and review of laboratory studies, ordering and review of radiographic studies, pulse oximetry and re-evaluation of patient's condition.        Final Clinical Impression(s) / ED Diagnoses Final diagnoses:  Symptomatic anemia  COPD exacerbation (HCC)  Rx / DC Orders ED Discharge Orders     None         Gloris Manchester, MD 12/26/22 1213

## 2022-12-26 NOTE — H&P (Addendum)
Date: 12/26/2022               Patient Name:  Jared [name] MRN: [id number]  DOB: 27-Jun-1955 Age / Sex: 67 y.o., male   PCP: [name], MD         Medical Service: Internal Medicine Teaching Service         Attending Physician: Dr. 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 States over the past week he has been getting weaker. He remembered similar episode last year which required him to get blood. He does not report any blood loss. When asked about his stools, he states they are dark and at times black. He does not recall seeing gross blood in his stool. On ROS he denies any chest pain, states breathing is at baseline but he does get short of breath sooner than before. Denies any sick contacts and any other infectious symptom.   Review of Systems negative unless stated in the HPI.  In the ED, pt found to have hgb of 5.9 2 units ordered. Iron studies ordered to find cause of anemia. IMTS consulted for admission.   Past Medical History: Past Medical History:  Diagnosis Date   AVM (arteriovenous malformation)    CAD (coronary artery disease)    a. LHC 5/12:  LAD 20, pLCx 20, pRCA 40, dRCA 40, EF 35%, diff HK  //  b. Myoview 4/16: Overall Impression:  High risk stress nuclear study There is no evidence of ischemia.  There is severe LV dysfunction. LV Ejection Fraction: 30%.  LV Wall Motion:  There is global LV hypokinesis.     CAP (community acquired pneumonia) 09/2013   Chronic combined systolic and  diastolic CHF (congestive heart failure) (HCC)    a. Echo 4/16:Mild LVH, EF 40-45%, diffuse HK //  b. Echo 8/17: EF 35-40%, diffuse HK, diastolic dysfunction, aortic sclerosis, trivial MR, moderate LAE, normal RVSF, moderate RAE, mild TR, PASP 42 mmHg // c. Echo 4/18: Mild concentric LVH, EF 30-35, normal wall motion, grade 1 diastolic dysfunction, PASP 49   Chronic respiratory failure (HCC)    Cluster headache    "hx; haven't had one in awhile" (01/09/2016)   COPD (chronic obstructive pulmonary disease) (HCC)    [name] 01/09/2016   DM (diabetes mellitus) (HCC)    History of CVA (cerebrovascular accident)    Hypertension    IDA (iron deficiency anemia)    Moderate tobacco use disorder    NICM (nonischemic cardiomyopathy) (HCC)    Nicotine addiction    Tobacco abuse    Type 2 diabetes mellitus (HCC) 05/14/2016    Meds: Current Outpatient Medications  Medication Instructions   Accu-Chek Softclix Lancets lancets Use as instructed daily before meals   acetaminophen (TYLENOL) 1,000 mg, Oral, Every 6 hours PRN   albuterol (VENTOLIN HFA) 108 (90 Base) MCG/ACT inhaler INHALE 2 PUFFS INTO THE LUNGS EVERY 6 (  SIX) HOURS AS NEEDED FOR WHEEZING OR SHORTNESS OF BREATH.   aspirin EC 81 mg, Oral, Daily, Swallow whole.   atorvastatin (LIPITOR) 80 mg, Oral, Daily   Budeson-Glycopyrrol-Formoterol (BREZTRI AEROSPHERE) 160-9-4.8 MCG/ACT AERO Take 2 puffs first thing in morning and then another 2 puffs about 12 hours later.   ezetimibe (ZETIA) 10 mg, Oral, Daily   FeroSul 325 mg, Oral, Daily with breakfast   folic acid (FOLVITE) 1 mg, Oral, Daily   furosemide (LASIX) 40 mg, Oral, 2 times daily   gabapentin (NEURONTIN) 300 mg, Oral, 2 times daily   glucose blood (ACCU-CHEK GUIDE) test strip Use as instructed daily   hydrALAZINE (APRESOLINE) 50 mg, Oral, Every 8 hours   isosorbide mononitrate (IMDUR) 30 mg, Oral, Daily   metoprolol succinate (TOPROL-XL) 50 mg, Oral, Daily, Take with or immediately following  a meal.   nitroGLYCERIN (NITROSTAT) 0.4 mg, Sublingual, Every 5 min PRN   OXYGEN 2 L/min, Inhalation, Continuous   pantoprazole (PROTONIX) 40 mg, Oral, Daily   terbinafine (LAMISIL AT ATHLETES FOOT) 1 % cream 1 Application, Topical, 2 times daily   Vitamin D (Ergocalciferol) (DRISDOL) 50,000 Units, Oral, Weekly   Xarelto 20 mg, Oral, Daily with supper    Allergies: Allergies as of 12/26/2022 - Review Complete 12/26/2022  Allergen Reaction Noted   Lisinopril Cough 06/29/2014    Past Surgical History: Past Surgical History:  Procedure Laterality Date   ABDOMINAL AORTOGRAM W/LOWER EXTREMITY N/A 12/07/2022   Procedure: ABDOMINAL AORTOGRAM W/LOWER EXTREMITY;  Surgeon: Leonie Douglas, MD;  Location: [facility];  Service: Cardiovascular;  Laterality: N/A;   BIOPSY  11/12/2020   Procedure: BIOPSY;  Surgeon: Lemar Lofty., MD;  Location: [facility] ENDOSCOPY;  Service: Gastroenterology;;   CARDIAC CATHETERIZATION  10/2010   LM normal, LAD with 20% irregularities, LCX with 20%, RCA with 40% prox and 40% distal - EF of 35%   CATARACT EXTRACTION, BILATERAL     COLONOSCOPY W/ BIOPSIES AND POLYPECTOMY     COLONOSCOPY WITH PROPOFOL N/A 09/06/2018   Procedure: COLONOSCOPY WITH PROPOFOL;  Surgeon: Tressia Danas, MD;  Location: [facility] ENDOSCOPY;  Service: Gastroenterology;  Laterality: N/A;   ENTEROSCOPY N/A 09/28/2018   Procedure: ENTEROSCOPY;  Surgeon: Jeani Hawking, MD;  Location: [facility] ENDOSCOPY;  Service: Endoscopy;  Laterality: N/A;   ENTEROSCOPY N/A 10/28/2018   Procedure: ENTEROSCOPY;  Surgeon: Tressia Danas, MD;  Location: [facility] ENDOSCOPY;  Service: Gastroenterology;  Laterality: N/A;   ENTEROSCOPY N/A 10/09/2020   Procedure: ENTEROSCOPY;  Surgeon: Hilarie Fredrickson, MD;  Location: [facility] ENDOSCOPY;  Service: Endoscopy;  Laterality: N/A;   ENTEROSCOPY N/A 03/22/2022   Procedure: ENTEROSCOPY;  Surgeon: Benancio Deeds, MD;  Location: [facility] ENDOSCOPY;  Service: Gastroenterology;  Laterality: N/A;    ESOPHAGOGASTRODUODENOSCOPY N/A 11/12/2020   Procedure: ESOPHAGOGASTRODUODENOSCOPY (EGD);  Surgeon: Lemar Lofty., MD;  Location: [facility] ENDOSCOPY;  Service: Gastroenterology;  Laterality: N/A;   ESOPHAGOGASTRODUODENOSCOPY (EGD) WITH PROPOFOL N/A 09/05/2018   Procedure: ESOPHAGOGASTRODUODENOSCOPY (EGD) WITH PROPOFOL;  Surgeon: Benancio Deeds, MD;  Location: [facility] ENDOSCOPY;  Service: Gastroenterology;  Laterality: N/A;   ESOPHAGOGASTRODUODENOSCOPY (EGD) WITH PROPOFOL N/A 11/19/2020   Procedure: ESOPHAGOGASTRODUODENOSCOPY (EGD) WITH PROPOFOL;  Surgeon: Beverley Fiedler, MD;  Location: [facility] ENDOSCOPY;  Service: Gastroenterology;  Laterality: N/A;   EXCISION MASS HEAD     GIVENS CAPSULE STUDY N/A 09/06/2018   Procedure: GIVENS CAPSULE STUDY;  Surgeon: Tressia Danas, MD;  Location: [facility] ENDOSCOPY;  Service: Gastroenterology;  Laterality: N/A;   GIVENS CAPSULE STUDY N/A 09/26/2018   Procedure:  GIVENS CAPSULE STUDY;  Surgeon: Beverley Fiedler, MD;  Location: [facility] ENDOSCOPY;  Service: Gastroenterology;  Laterality: N/A;   GIVENS CAPSULE STUDY N/A 06/14/2019   Procedure: GIVENS CAPSULE STUDY;  Surgeon: Napoleon Form, MD;  Location: [facility] ENDOSCOPY;  Service: Endoscopy;  Laterality: N/A;   HEMOSTASIS CLIP PLACEMENT  11/12/2020   Procedure: HEMOSTASIS CLIP PLACEMENT;  Surgeon: Lemar Lofty., MD;  Location: [facility] ENDOSCOPY;  Service: Gastroenterology;;   HEMOSTASIS CONTROL  11/12/2020   Procedure: HEMOSTASIS CONTROL;  Surgeon: Lemar Lofty., MD;  Location: [facility] ENDOSCOPY;  Service: Gastroenterology;;   HOT HEMOSTASIS N/A 10/28/2018   Procedure: HOT HEMOSTASIS (ARGON PLASMA COAGULATION/BICAP);  Surgeon: Tressia Danas, MD;  Location: [facility] ENDOSCOPY;  Service: Gastroenterology;  Laterality: N/A;   HOT HEMOSTASIS N/A 11/12/2020   Procedure: HOT HEMOSTASIS (ARGON PLASMA COAGULATION/BICAP);  Surgeon: Lemar Lofty., MD;  Location: [facility] ENDOSCOPY;  Service: Gastroenterology;  Laterality: N/A;   HOT  HEMOSTASIS N/A 03/22/2022   Procedure: HOT HEMOSTASIS (ARGON PLASMA COAGULATION/BICAP);  Surgeon: Benancio Deeds, MD;  Location: [facility] ENDOSCOPY;  Service: Gastroenterology;  Laterality: N/A;   INCISION AND DRAINAGE PERIRECTAL ABSCESS N/A 06/05/2017   Procedure: IRRIGATION AND DEBRIDEMENT PERIRECTAL ABSCESS;  Surgeon: Andria Meuse, MD;  Location: [facility] OR;  Service: General;  Laterality: N/A;   PERIPHERAL VASCULAR INTERVENTION  12/07/2022   Procedure: PERIPHERAL VASCULAR INTERVENTION;  Surgeon: Leonie Douglas, MD;  Location: [facility]VAS[facility] CV LAB;  Service: Cardiovascular;;   SUBMUCOSAL TATTOO INJECTION  11/12/2020   Procedure: SUBMUCOSAL TATTOO INJECTION;  Surgeon: Lemar Lofty., MD;  Location: [facility] ENDOSCOPY;  Service: Gastroenterology;;   VIDEO BRONCHOSCOPY Bilateral 05/08/2016   Procedure: VIDEO BRONCHOSCOPY WITH FLUORO;  Surgeon: Oretha Milch, MD;  Location: [facility] ENDOSCOPY;  Service: Cardiopulmonary;  Laterality: Bilateral;    Family History:  Family History  Problem Relation Age of Onset   Heart disease Mother    Diabetes Mother    Colon cancer Mother    Liver cancer Mother    Cancer Father        type unknown   Diabetes Sister        x 2   Diabetes Brother     Social History:  Lives at boarding house.  Currently disabled Tobacco- Current smoker 1/2 ppd x 50 and up to 2 packs intermittently. EtOH- Occasional drinking whiskey Illicit drug use- denies use.  IADLs/ADLs- can person independently at baseline   Physical Exam: Blood pressure 122/75, pulse 91, temperature 97.7 F (36.5 C), temperature source Oral, resp. rate 18, height 6\' 3"  (1.905 m), weight 100 kg, SpO2 97%. General: NAD HENT: NCAT, on [place] at 2 L Lungs: expiratory wheeze present, no accessory muscle use Cardiovascular: NSR, good pulses Abdomen:no TTP, normal bowel sounds MSK: no asymmetry Skin: no lesion on skin Neuro: alert and oriented x4 Psych: normal mood and normal  affect  Diagnostics:     Latest Ref Rng & Units 12/26/2022    9:47 AM 12/26/2022    9:39 AM 12/07/2022   12:08 PM  CBC  WBC 4.0 - 10.5 K/uL  8.5    Hemoglobin 13.0 - 17.0 g/dL 7.8  5.9  8.8   Hematocrit 39.0 - 52.0 % 23.0  22.1  26.0   Platelets 150 - 400 K/uL  417         Latest Ref Rng & Units 12/26/2022    9:47 AM 12/26/2022    9:39 AM 12/07/2022   12:08 PM  CMP  Glucose 70 - 99 mg/dL  172  126   BUN 8 - 23 mg/dL  16  8   Creatinine 5.78 - 1.24 mg/dL  4.69  6.29   Sodium 528 - 145 mmol/L 140  138  138   Potassium 3.5 - 5.1 mmol/L 3.7  3.7  4.3   Chloride 98 - 111 mmol/L  100  96   CO2 22 - 32 mmol/L  28    Calcium 8.9 - 10.3 mg/dL  8.8    Total Protein 6.5 - 8.1 g/dL  6.5    Total Bilirubin 0.3 - 1.2 mg/dL  0.4    Alkaline Phos 38 - 126 U/L  66    AST 15 - 41 U/L  23    ALT 0 - 44 U/L  21      DG Chest Port 1 View  Result Date: 12/26/2022 CLINICAL DATA:  Shortness of breath and weakness for the past 2 days. EXAM: PORTABLE CHEST 1 VIEW COMPARISON:  Chest x-ray dated March 21, 2022. FINDINGS: The heart size and mediastinal contours are within normal limits. Both lungs are clear. No acute osseous abnormality. Old left-sided rib fractures again noted. IMPRESSION: No active disease. Electronically Signed   By: Obie Dredge M.D.   On: 12/26/2022 10:17     EKG: personally reviewed my interpretation is sinus tachycardia with ectopic beats. Slightly irregular rhythm but P waves notable.   CXR: personally reviewed my interpretation is normal CXR.   Assessment & Plan by Problem:  Present on Admission:  Symptomatic anemia   Symptomatic Anemia  Iron Deficiency Anemia  Appears to be acute on chronic bleed from known AVM. Given degree of anemia and relatively fast decline (pt was 11.5 2 months ago and 9.5 on Istat around 2 weeks ago, suggest some acuteness of the bleed. GI consulted and will see pt. Pt is known to [facility] and sees them OP.  Appreciate GI assistance with this  pt. Pt is HDS. Plan to maintain 2 large bore IV, give pRBC as needed for hgb<7. Iron stores are depleted which appears consistent with chronic anemia. MCV not consistent with microcytic anemia which can be explained by increase in nucleated RBCs which have larger diameter. Will calculate iron deficit tomorrow after 2 units today and plan to give IV iron. B12 and folate normal. Will need to follow up post transfusion H/H. Will hold xarelto but given his chronic nature of bleed and hx of afib. He might benefit from evaluation for Watchman device to minimize recurrence of the bleed.    Chronic Problems Chronic Respiratory Failure 2/2 to COPD: Will continue home inhalers and order prn nebs given he has some wheezing noted on exam. No other symptoms to suggest COPD exacerbation.  HFpEF: Appears euvolemic. Will continue home meds. Not on diuretics.  HTN: Will start low and titrate as needed given severe anemia.  Afib/Aflutter: holding coagulation, will rate control. Appears to be in sinus rhythm currently.  HLD/CAD: Continue home Zetia/Lipitor T2DM: SSI while inpatient Tobacco Use Disorder: will counsel on cessation and the importance of this given his chronic hypoxic respiratory failure.  Lung Nodule: Gets annual lung cancer screening. Had a pulmonary nodule that will need re-imaging in 01/2023. Already scheduled.   DVT prophx: SCD Diet: HH diet Bowel: PRN Code: Full Code  Prior to Admission Living Arrangement: Home Anticipated Discharge Location: Home Barriers to Discharge: Medical Workup  Dispo: Admit patient to Inpatient with expected length of stay greater than 2 midnights.  Gwenevere Abbot, MD Eligha Bridegroom.  [facility] Internal Medicine Residency, PGY-3 Pager: [phone] After 5 pm or weekends:  1st Contact: Pager: [phone]  2nd Contact: Pager: [phone]

## 2022-12-26 NOTE — ED Notes (Signed)
 Blood consent at bedside.

## 2022-12-26 NOTE — ED Triage Notes (Signed)
Pt arrives POV with complaints of weakness and shob x 2-3 days. Pt states last time this happened he needed a blood transfusion. Pt denies and dark stools or bleeding. Ambulatory with assistance, aox4, resp labored. 2LNC at baseline

## 2022-12-26 NOTE — ED Notes (Signed)
Blood consent signed and at patient's bedside.

## 2022-12-27 ENCOUNTER — Inpatient Hospital Stay (HOSPITAL_COMMUNITY): Payer: Medicare HMO | Admitting: Anesthesiology

## 2022-12-27 ENCOUNTER — Encounter (HOSPITAL_COMMUNITY)
Admission: EM | Disposition: A | Discharge status: Home / Self Care | Payer: Self-pay | Source: Home / Self Care | Attending: Emergency Medicine

## 2022-12-27 ENCOUNTER — Inpatient Hospital Stay (HOSPITAL_BASED_OUTPATIENT_CLINIC_OR_DEPARTMENT_OTHER): Payer: Medicare HMO | Admitting: Anesthesiology

## 2022-12-27 ENCOUNTER — Encounter (HOSPITAL_COMMUNITY): Payer: Self-pay | Admitting: Internal Medicine

## 2022-12-27 DIAGNOSIS — Z7901 Long term (current) use of anticoagulants: Secondary | ICD-10-CM

## 2022-12-27 DIAGNOSIS — R58 Hemorrhage, not elsewhere classified: Secondary | ICD-10-CM

## 2022-12-27 DIAGNOSIS — T182XXA Foreign body in stomach, initial encounter: Principal | ICD-10-CM

## 2022-12-27 DIAGNOSIS — I2510 Atherosclerotic heart disease of native coronary artery without angina pectoris: Secondary | ICD-10-CM | POA: Diagnosis not present

## 2022-12-27 DIAGNOSIS — K2289 Other specified disease of esophagus: Secondary | ICD-10-CM | POA: Diagnosis not present

## 2022-12-27 DIAGNOSIS — I4891 Unspecified atrial fibrillation: Secondary | ICD-10-CM

## 2022-12-27 DIAGNOSIS — I110 Hypertensive heart disease with heart failure: Secondary | ICD-10-CM

## 2022-12-27 DIAGNOSIS — J9610 Chronic respiratory failure, unspecified whether with hypoxia or hypercapnia: Secondary | ICD-10-CM | POA: Diagnosis not present

## 2022-12-27 DIAGNOSIS — J449 Chronic obstructive pulmonary disease, unspecified: Secondary | ICD-10-CM | POA: Diagnosis not present

## 2022-12-27 DIAGNOSIS — K922 Gastrointestinal hemorrhage, unspecified: Secondary | ICD-10-CM

## 2022-12-27 DIAGNOSIS — F17210 Nicotine dependence, cigarettes, uncomplicated: Secondary | ICD-10-CM

## 2022-12-27 DIAGNOSIS — I480 Paroxysmal atrial fibrillation: Secondary | ICD-10-CM

## 2022-12-27 DIAGNOSIS — E119 Type 2 diabetes mellitus without complications: Secondary | ICD-10-CM | POA: Diagnosis not present

## 2022-12-27 DIAGNOSIS — I509 Heart failure, unspecified: Secondary | ICD-10-CM

## 2022-12-27 DIAGNOSIS — D509 Iron deficiency anemia, unspecified: Secondary | ICD-10-CM

## 2022-12-27 HISTORY — PX: ESOPHAGOGASTRODUODENOSCOPY (EGD) WITH PROPOFOL: SHX5813

## 2022-12-27 LAB — CBC
HCT: 24.8 % — ABNORMAL LOW (ref 39.0–52.0)
HCT: 28 % — ABNORMAL LOW (ref 39.0–52.0)
Hemoglobin: 7 g/dL — ABNORMAL LOW (ref 13.0–17.0)
Hemoglobin: 8.3 g/dL — ABNORMAL LOW (ref 13.0–17.0)
MCH: 23.1 pg — ABNORMAL LOW (ref 26.0–34.0)
MCH: 25 pg — ABNORMAL LOW (ref 26.0–34.0)
MCHC: 28.2 g/dL — ABNORMAL LOW (ref 30.0–36.0)
MCHC: 29.6 g/dL — ABNORMAL LOW (ref 30.0–36.0)
MCV: 81.8 fL (ref 80.0–100.0)
MCV: 84.3 fL (ref 80.0–100.0)
Platelets: 409 10*3/uL — ABNORMAL HIGH (ref 150–400)
Platelets: 391 10*3/uL (ref 150–400)
RBC: 3.03 MIL/uL — ABNORMAL LOW (ref 4.22–5.81)
RBC: 3.32 MIL/uL — ABNORMAL LOW (ref 4.22–5.81)
RDW: 20.1 % — ABNORMAL HIGH (ref 11.5–15.5)
RDW: 19 % — ABNORMAL HIGH (ref 11.5–15.5)
WBC: 10.2 10*3/uL (ref 4.0–10.5)
WBC: 12.1 10*3/uL — ABNORMAL HIGH (ref 4.0–10.5)
nRBC: 1.2 % — ABNORMAL HIGH (ref 0.0–0.2)
nRBC: 1.7 % — ABNORMAL HIGH (ref 0.0–0.2)

## 2022-12-27 LAB — PREPARE RBC (CROSSMATCH)

## 2022-12-27 LAB — GLUCOSE, CAPILLARY
Glucose-Capillary: 153 mg/dL — ABNORMAL HIGH (ref 70–99)
Glucose-Capillary: 105 mg/dL — ABNORMAL HIGH (ref 70–99)
Glucose-Capillary: 152 mg/dL — ABNORMAL HIGH (ref 70–99)
Glucose-Capillary: 166 mg/dL — ABNORMAL HIGH (ref 70–99)

## 2022-12-27 LAB — BASIC METABOLIC PANEL
Anion gap: 10 (ref 5–15)
BUN: 15 mg/dL (ref 8–23)
CO2: 29 mmol/L (ref 22–32)
Calcium: 8.8 mg/dL — ABNORMAL LOW (ref 8.9–10.3)
Chloride: 97 mmol/L — ABNORMAL LOW (ref 98–111)
Creatinine, Ser: 1 mg/dL (ref 0.61–1.24)
GFR, Estimated: >60 mL/min (ref >60)
Glucose, Bld: 263 mg/dL — ABNORMAL HIGH (ref 70–99)
Potassium: 4.3 mmol/L (ref 3.5–5.1)
Sodium: 136 mmol/L (ref 135–145)

## 2022-12-27 LAB — POCT I-STAT, CHEM 8
BUN: 16 mg/dL (ref 8–23)
Calcium, Ion: 1.16 mmol/L (ref 1.15–1.40)
Chloride: 100 mmol/L (ref 98–111)
Creatinine, Ser: 1.1 mg/dL (ref 0.61–1.24)
Glucose, Bld: 117 mg/dL — ABNORMAL HIGH (ref 70–99)
HCT: 29 % — ABNORMAL LOW (ref 39.0–52.0)
Hemoglobin: 9.9 g/dL — ABNORMAL LOW (ref 13.0–17.0)
Potassium: 4.1 mmol/L (ref 3.5–5.1)
Sodium: 139 mmol/L (ref 135–145)
TCO2: 30 mmol/L (ref 22–32)

## 2022-12-27 SURGERY — ESOPHAGOGASTRODUODENOSCOPY (EGD) WITH PROPOFOL
Anesthesia: Monitor Anesthesia Care

## 2022-12-27 MED ORDER — PROPOFOL 10 MG/ML IV BOLUS
INTRAVENOUS | Status: DC | PRN
Start: 2022-12-27 — End: 2022-12-27
  Administered 2022-12-27: 20 mg via INTRAVENOUS

## 2022-12-27 MED ORDER — INSULIN ASPART 100 UNIT/ML IJ SOLN: 0.0000 [IU] | Freq: Every day | INTRAMUSCULAR | Status: DC

## 2022-12-27 MED ORDER — RIVAROXABAN 10 MG PO TABS
20.0000 mg | ORAL_TABLET | Freq: Every day | ORAL | Status: DC
  Administered 2022-12-27: 20 mg via ORAL
  Filled 2022-12-27: qty 2

## 2022-12-27 MED ORDER — PROPOFOL 500 MG/50ML IV EMUL
INTRAVENOUS | Status: DC | PRN
  Administered 2022-12-27: 100 ug/kg/min via INTRAVENOUS

## 2022-12-27 MED ORDER — SODIUM CHLORIDE 0.9% IV SOLUTION: Freq: Once | INTRAVENOUS | Status: DC

## 2022-12-27 MED ORDER — SODIUM CHLORIDE 0.9 % IV SOLN
250.0000 mg | Freq: Every day | INTRAVENOUS | Status: DC
  Administered 2022-12-27: 250 mg via INTRAVENOUS
  Filled 2022-12-27 (×2): qty 20

## 2022-12-27 MED ORDER — IPRATROPIUM-ALBUTEROL 0.5-2.5 (3) MG/3ML IN SOLN
RESPIRATORY_TRACT | Status: AC
  Filled 2022-12-27: qty 3

## 2022-12-27 MED ORDER — PANTOPRAZOLE SODIUM 40 MG PO TBEC
40.0000 mg | DELAYED_RELEASE_TABLET | Freq: Every day | ORAL | Status: DC
  Administered 2022-12-28: 40 mg via ORAL
  Filled 2022-12-27: qty 1

## 2022-12-27 MED ORDER — LACTATED RINGERS IV SOLN: INTRAVENOUS | Status: DC | PRN

## 2022-12-27 MED ORDER — INSULIN ASPART 100 UNIT/ML IJ SOLN
0.0000 [IU] | Freq: Three times a day (TID) | INTRAMUSCULAR | Status: DC
  Administered 2022-12-27: 2 [IU] via SUBCUTANEOUS

## 2022-12-27 MED ORDER — LACTATED RINGERS IV SOLN: INTRAVENOUS | Status: DC

## 2022-12-27 MED ORDER — LIDOCAINE 2% (20 MG/ML) 5 ML SYRINGE
INTRAMUSCULAR | Status: DC | PRN
  Administered 2022-12-27: 20 mg via INTRAVENOUS

## 2022-12-27 SURGICAL SUPPLY — 15 items

## 2022-12-27 NOTE — Progress Notes (Addendum)
HD#1 SUBJECTIVE:  Patient Summary: Jared [name] is a 67 y.o. with a pertinent PMH of chronic GI bleed from known AVM, CAD w/ hx of CABG, combined systolic and diastolic heart failure, chronic respiratory failure secondary to COPD, T2DM, HLD, and atrial fibrillation who presented with progressive weakness and dyspnea on exertion and admitted for symptomatic anemia.   Overnight Events: Overnight, he received 1 unit of blood after his hemoglobin was at 7.  Interim History: Patient was evaluated bedside.  Denies any nausea, vomiting, or chest pain.  Last passed a stool yesterday that was dark in nature.  He denies any bright red stools.  His breathing has returned to baseline but he has not ambulated yet.  OBJECTIVE:  Vital Signs: Vitals:   12/27/22 0458 12/27/22 0500 12/27/22 0539 12/27/22 0744  BP: (!) 145/79  138/79 138/67  Pulse: 83  76 78  Resp: 16  19 18   Temp: (!) 97.3 F (36.3 C)  97.9 F (36.6 C) 98.1 F (36.7 C)  TempSrc: Oral  Oral Oral  SpO2: 100%  100% 100%  Weight:  99.5 kg    Height:       Supplemental O2: Nasal Cannula SpO2: 100 % O2 Flow Rate (L/min): 2 L/min  Filed Weights   12/26/22 0929 12/27/22 0500  Weight: 100 kg 99.5 kg     Intake/Output Summary (Last 24 hours) at 12/27/2022 0755 Last data filed at 12/27/2022 0539 Gross per 24 hour  Intake 1170 ml  Output 500 ml  Net 670 ml   Net IO Since Admission: 670 mL [12/27/22 0755]  Physical Exam: Physical Exam Constitutional:      Appearance: He is well-developed.  HENT:     Head: Normocephalic and atraumatic.  Cardiovascular:     Rate and Rhythm: Normal rate and regular rhythm.     Heart sounds: Normal heart sounds.  Pulmonary:     Effort: Pulmonary effort is normal.     Breath sounds: Normal breath sounds.  Abdominal:     General: Bowel sounds are normal.     Palpations: Abdomen is soft.  Musculoskeletal:     Right lower leg: No edema.     Left lower leg: No edema.  Neurological:      General: No focal deficit present.     Mental Status: He is alert.  Psychiatric:        Mood and Affect: Mood and affect normal.    ASSESSMENT/PLAN:  Assessment: Principal Problem:   Symptomatic anemia  Plan: #Symptomatic anemia #Iron deficiency anemia #History of small bowel AVMs #History of gastroduodenal ulcer and H. pylori infection Patient presented with 1 week of progressive fatigue and dyspnea and found to have anemia to 5.9 with severe iron deficiency.  He received 2 units of blood since admission.  After transfusion, his hemoglobin increased to 8.3.  GI performed EGD who found no acute abnormalities.  His iron deficit was calculated at 1.7 grams which was replenished with IV iron.  - Patient received 250 mg of Ferrlecit x1 (+ 3U pRBC = ~1000 mg), repeat tomorrow 7/18 - Follow-up with PCP for routine monitoring of hemoglobin and iron panel  - Resume oral Protonix 40 mg - Resume Xarelto  20 mg  #Chronic respiratory failure secondary to COPD Wheezing noted on exam but patient notes he is at his baseline oxygen requirement  - Continue albuterol 6 hrs PRN and DUONEB  #History of atrial fibrillation Coagulation held due to bleeding concerns. Xarelto will be resumed and carefully  evaluated.  - Resume Xarelto 20 mg   #History of hyperlipidemia #Coronary artery disease - Continue Zetia 10 mg and Lipitor 80 mg  #Type 2 diabetes Initiated on SSI for glucose control  - Trend glucose levels   Best Practice: Diet: Regular diet VTE: Place and maintain sequential compression device Start: 12/26/22 1649 SCDs Start: 12/26/22 1345 Code: Full AB: None DISPO: Anticipated discharge tomorrow to Home pending  resolution of anemia .  Signature: Morrie Sheldon, MD Internal Medicine Resident, PGY-1 [facility] Internal Medicine Residency  Pager: [phone]  Please contact the on call pager after 5 pm and on weekends at [phone].

## 2022-12-27 NOTE — Consult Note (Addendum)
Consultation Note   Referring Provider: Internal medicine teaching service  PCP: Jared Register, MD Primary Gastroenterologist: Jared Jupiter, MD        Reason for consultation: GI bleed  DOA: 12/26/2022         Hospital Day: 2   Attending physician's note  I have taken a history, reviewed the chart and examined the patient. I performed a substantive portion of this encounter, including complete performance of at least one of the key components, in conjunction with the APP. I agree with the APP's note, impression and recommendations.    67 year old male with history of CAD, CHF, chronic respiratory failure secondary to COPD, history of CVA, A-fib on Xarelto with chronic iron deficiency anemia secondary to known occult GI blood loss with history of AVMs  Admitted with severe anemia hemoglobin 5.9 He has history of gastroduodenal ulcer and H. pylori infection Will plan to proceed with EGD to evaluate and therapeutically intervene if needed Xarelto on hold  The risks and benefits as well as alternatives of endoscopic procedure(s) have been discussed and reviewed. All questions answered. The patient agrees to proceed.  The patient was provided an opportunity to ask questions and all were answered. The patient agreed with the plan and demonstrated an understanding of the instructions.  Jared [name] , MD [phone]     Assessment and Plan   Recurrent, profound iron deficiency anemia /FOBT negative. Declining hemoglobin over the last few months including 3 g drop in the last 3 weeks.  Bleeding probably secondary to known small bowel AVMs versus recurrent PUD -He received 2 units PRBCs this a.m. with improvement in hemoglobin t from 5.9 to 7 -Schedule for EGD. The risks and benefits of EGD with possible biopsies were discussed with the patient who agrees to proceed.  -Continue twice twice daily IV pantoprazole  -Should receive IV iron  prior to hospital discharge  Afib, on Xarelto -Last dose 7/16  Chronic respiratory failure secondary to COPD. On home 02  DM2  Combined systolic and diastolic heart failure  See PMH for additional medical history    Pertinent GI History:  Patient is a 67 y.o. year old male with a past medical history CAD with hx of CABG, combined systolic and diastolic heart failure, chronic respiratory failure 2/2 to COPD on 2 L [place], T2DM, HLD c/b CVA, HTN, afib on Xarelto, iron deficiency anemia , small bowel AVMs, gastric ulcer, H. pylori infection  Jared [name] has been hospitalized multiple times over the last few years for GI bleeding.  He has had multiple upper endoscopies with findings of small bowel AVMs as well as PUD.  Small bowel video capsule study in 2021 was unremarkable.  Last admission for GI bleed/anemia was in November 2023   HISTORY OF PRESENT ILLNESS  Jared [name] was admitted yesterday with symptomatic anemia on Xarelto. He hasn't seen any dark stools, no blood in stools. He has no abdominal pain, no N/V or bowel changes. Appetite is good, reports stable weight. His thinks he is taking Pantoprazole but not certain. He is taking oral 1-2 times a day.   Workup notable for hemoglobin of 5.9, down from 8.8 the end of June.  However, hemoglobin was 11.5 mid May 2024.  Ferritin  is 3.   Labs and Imaging: Recent Labs    12/26/22 0939 12/26/22 0947 12/26/22 2345  WBC 8.5  --  10.2  HGB 5.9* 7.8* 7.0*  HCT 22.1* 23.0* 24.8*  PLT 417*  --  409*   Recent Labs    12/26/22 0939 12/26/22 0947 12/26/22 2345  NA 138 140 136  K 3.7 3.7 4.3  CL 100  --  97*  CO2 28  --  29  GLUCOSE 172*  --  263*  BUN 16  --  15  CREATININE 1.18  --  1.00  CALCIUM 8.8*  --  8.8*   Recent Labs    12/26/22 0939  PROT 6.5  ALBUMIN 3.5  AST 23  ALT 21  ALKPHOS 66  BILITOT 0.4   No results for input(s): "HEPBSAG", "HCVAB", "HEPAIGM", "HEPBIGM" in the last 72 hours. No results for input(s): "LABPROT",  "INR" in the last 72 hours.  Previous GI Evaluation:   06/14/2019 capsule endoscopy: Complete capsule endoscopy with findings of gastric erythema/gastritis, no AVMs visualized, no active bleeding 10/28/2018 small bowel enteroscopy: Small AVM ablated, otherwise normal 09/28/2018 small bowel enteroscopy: Normal 09/26/2018 capsule endoscopy: Active bleeding 1 minute after entry into the duodenum, enteroscopy scheduled the next day March 2020 colonoscopy for GI bleeding:  complete exam, excellent prep.  Entire colon normal except for hemorrhoids 10/09/20 small bowel endoscopy: Normal small bowel enteroscopy to the proximal jejunum. Suspect iron deficiency secondary to intermittent mucosal oozing in the patient on chronic oral anticoagulation therapy 11/12/20 EGD:   --No gross lesions in esophagus proximally.  --Salmon-colored mucosa noted in distal esoohagus - not biopsied due to concern for possible underlying varices.  --Hematin (altered blood/coffee-ground-like material) in the entire stomach - lavaged. - 2 gastric ulcers as described above with a nonbleeding visible vessel (Forrest Class IIa). - Gastritis. Biopsied. - Blood in the entire examined duodenum - lavaged. - No gross lesions in the entire examined duodenum. - Jejunal blood - lavaged . - No gross lesions in the entire examined proximal jejunum. Tattooed distal extent.   A. GASTRIC BIOPSY:  - Chronic active gastritis with Helicobacter pylori with focal  intestinal metaplasia. Warthin-Starry is positive for Helicobacter pylori.  - No dysplasia or malignancy   11/19/20 EGD. Repeat exam done because patient returned to hospital hypotensive with abdominal pain  -Z-line irregular, 44 cm from the incisors. - Endoclips were found in the gastric cardia/fundus without evidence of rebleeding or stigmata or recent bleeding. Good treatment effective from endoclipping.   Oct 2023 Small bowel endoscopy  - Z-line irregular. - Normal esophagus -  Hemostasis clip x 2 were found in the stomach. - Normal stomach otherwise - Two non-bleeding angiodysplastic lesions in the duodenum. Treated with argon plasma coagulation (APC). - A single non-bleeding angiodysplastic lesion in the duodenum. Treated with argon plasma coagulation (APC). - A tattoo was seen in the jejunum. - Normal small bowel otherwise.   Past Medical History:  Diagnosis Date   AVM (arteriovenous malformation)    CAD (coronary artery disease)    a. LHC 5/12:  LAD 20, pLCx 20, pRCA 40, dRCA 40, EF 35%, diff HK  //  b. Myoview 4/16: Overall Impression:  High risk stress nuclear study There is no evidence of ischemia.  There is severe LV dysfunction. LV Ejection Fraction: 30%.  LV Wall Motion:  There is global LV hypokinesis.     CAP (community acquired pneumonia) 09/2013   Chronic combined systolic and diastolic CHF (congestive heart failure) (  HCC)    a. Echo 4/16:Mild LVH, EF 40-45%, diffuse HK //  b. Echo 8/17: EF 35-40%, diffuse HK, diastolic dysfunction, aortic sclerosis, trivial MR, moderate LAE, normal RVSF, moderate RAE, mild TR, PASP 42 mmHg // c. Echo 4/18: Mild concentric LVH, EF 30-35, normal wall motion, grade 1 diastolic dysfunction, PASP 49   Chronic respiratory failure (HCC)    Cluster headache    "hx; haven't had one in awhile" (01/09/2016)   COPD (chronic obstructive pulmonary disease) (HCC)    [name] 01/09/2016   DM (diabetes mellitus) (HCC)    History of CVA (cerebrovascular accident)    Hypertension    IDA (iron deficiency anemia)    Moderate tobacco use disorder    NICM (nonischemic cardiomyopathy) (HCC)    Nicotine addiction    Tobacco abuse    Type 2 diabetes mellitus (HCC) 05/14/2016    Past Surgical History:  Procedure Laterality Date   ABDOMINAL AORTOGRAM W/LOWER EXTREMITY N/A 12/07/2022   Procedure: ABDOMINAL AORTOGRAM W/LOWER EXTREMITY;  Surgeon: Leonie Douglas, MD;  Location: [facility];  Service: Cardiovascular;  Laterality: N/A;    BIOPSY  11/12/2020   Procedure: BIOPSY;  Surgeon: Lemar Lofty., MD;  Location: [facility] ENDOSCOPY;  Service: Gastroenterology;;   CARDIAC CATHETERIZATION  10/2010   LM normal, LAD with 20% irregularities, LCX with 20%, RCA with 40% prox and 40% distal - EF of 35%   CATARACT EXTRACTION, BILATERAL     COLONOSCOPY W/ BIOPSIES AND POLYPECTOMY     COLONOSCOPY WITH PROPOFOL N/A 09/06/2018   Procedure: COLONOSCOPY WITH PROPOFOL;  Surgeon: Tressia Danas, MD;  Location: [facility] ENDOSCOPY;  Service: Gastroenterology;  Laterality: N/A;   ENTEROSCOPY N/A 09/28/2018   Procedure: ENTEROSCOPY;  Surgeon: Jeani Hawking, MD;  Location: [facility] ENDOSCOPY;  Service: Endoscopy;  Laterality: N/A;   ENTEROSCOPY N/A 10/28/2018   Procedure: ENTEROSCOPY;  Surgeon: Tressia Danas, MD;  Location: [facility] ENDOSCOPY;  Service: Gastroenterology;  Laterality: N/A;   ENTEROSCOPY N/A 10/09/2020   Procedure: ENTEROSCOPY;  Surgeon: Hilarie Fredrickson, MD;  Location: [facility] ENDOSCOPY;  Service: Endoscopy;  Laterality: N/A;   ENTEROSCOPY N/A 03/22/2022   Procedure: ENTEROSCOPY;  Surgeon: Benancio Deeds, MD;  Location: [facility] ENDOSCOPY;  Service: Gastroenterology;  Laterality: N/A;   ESOPHAGOGASTRODUODENOSCOPY N/A 11/12/2020   Procedure: ESOPHAGOGASTRODUODENOSCOPY (EGD);  Surgeon: Lemar Lofty., MD;  Location: [facility] ENDOSCOPY;  Service: Gastroenterology;  Laterality: N/A;   ESOPHAGOGASTRODUODENOSCOPY (EGD) WITH PROPOFOL N/A 09/05/2018   Procedure: ESOPHAGOGASTRODUODENOSCOPY (EGD) WITH PROPOFOL;  Surgeon: Benancio Deeds, MD;  Location: [facility] ENDOSCOPY;  Service: Gastroenterology;  Laterality: N/A;   ESOPHAGOGASTRODUODENOSCOPY (EGD) WITH PROPOFOL N/A 11/19/2020   Procedure: ESOPHAGOGASTRODUODENOSCOPY (EGD) WITH PROPOFOL;  Surgeon: Beverley Fiedler, MD;  Location: [facility] ENDOSCOPY;  Service: Gastroenterology;  Laterality: N/A;   EXCISION MASS HEAD     GIVENS CAPSULE STUDY N/A 09/06/2018   Procedure: GIVENS CAPSULE STUDY;  Surgeon: Tressia Danas, MD;   Location: [facility] ENDOSCOPY;  Service: Gastroenterology;  Laterality: N/A;   GIVENS CAPSULE STUDY N/A 09/26/2018   Procedure: GIVENS CAPSULE STUDY;  Surgeon: Beverley Fiedler, MD;  Location: [facility] ENDOSCOPY;  Service: Gastroenterology;  Laterality: N/A;   GIVENS CAPSULE STUDY N/A 06/14/2019   Procedure: GIVENS CAPSULE STUDY;  Surgeon: Napoleon Form, MD;  Location: [facility] ENDOSCOPY;  Service: Endoscopy;  Laterality: N/A;   HEMOSTASIS CLIP PLACEMENT  11/12/2020   Procedure: HEMOSTASIS CLIP PLACEMENT;  Surgeon: Lemar Lofty., MD;  Location: [facility] ENDOSCOPY;  Service: Gastroenterology;;   HEMOSTASIS CONTROL  11/12/2020  Procedure: HEMOSTASIS CONTROL;  Surgeon: Lemar Lofty., MD;  Location: [facility] ENDOSCOPY;  Service: Gastroenterology;;   HOT HEMOSTASIS N/A 10/28/2018   Procedure: HOT HEMOSTASIS (ARGON PLASMA COAGULATION/BICAP);  Surgeon: Tressia Danas, MD;  Location: [facility] ENDOSCOPY;  Service: Gastroenterology;  Laterality: N/A;   HOT HEMOSTASIS N/A 11/12/2020   Procedure: HOT HEMOSTASIS (ARGON PLASMA COAGULATION/BICAP);  Surgeon: Lemar Lofty., MD;  Location: [facility] ENDOSCOPY;  Service: Gastroenterology;  Laterality: N/A;   HOT HEMOSTASIS N/A 03/22/2022   Procedure: HOT HEMOSTASIS (ARGON PLASMA COAGULATION/BICAP);  Surgeon: Benancio Deeds, MD;  Location: [facility] ENDOSCOPY;  Service: Gastroenterology;  Laterality: N/A;   INCISION AND DRAINAGE PERIRECTAL ABSCESS N/A 06/05/2017   Procedure: IRRIGATION AND DEBRIDEMENT PERIRECTAL ABSCESS;  Surgeon: Andria Meuse, MD;  Location: [facility] OR;  Service: General;  Laterality: N/A;   PERIPHERAL VASCULAR INTERVENTION  12/07/2022   Procedure: PERIPHERAL VASCULAR INTERVENTION;  Surgeon: Leonie Douglas, MD;  Location: [facility];  Service: Cardiovascular;;   SUBMUCOSAL TATTOO INJECTION  11/12/2020   Procedure: SUBMUCOSAL TATTOO INJECTION;  Surgeon: Lemar Lofty., MD;  Location: [facility] ENDOSCOPY;  Service: Gastroenterology;;   VIDEO BRONCHOSCOPY  Bilateral 05/08/2016   Procedure: VIDEO BRONCHOSCOPY WITH FLUORO;  Surgeon: Oretha Milch, MD;  Location: [facility] ENDOSCOPY;  Service: Cardiopulmonary;  Laterality: Bilateral;    Family History  Problem Relation Age of Onset   Heart disease Mother    Diabetes Mother    Colon cancer Mother    Liver cancer Mother    Cancer Father        type unknown   Diabetes Sister        x 2   Diabetes Brother     Prior to Admission medications   Medication Sig Start Date End Date Taking? Authorizing Provider  acetaminophen (TYLENOL) 500 MG tablet Take 1,000 mg by mouth every 6 (six) hours as needed for moderate pain.   Yes [provider]  albuterol (VENTOLIN HFA) 108 (90 Base) MCG/ACT inhaler INHALE 2 PUFFS INTO THE LUNGS EVERY 6 (SIX) HOURS AS NEEDED FOR WHEEZING OR SHORTNESS OF BREATH. Patient taking differently: Inhale 2 puffs into the lungs daily as needed for wheezing or shortness of breath. 12/06/22  Yes Jared Register, MD  aspirin EC 81 MG tablet Take 1 tablet (81 mg total) by mouth daily. Swallow whole. 12/07/22  Yes Leonie Douglas, MD  atorvastatin (LIPITOR) 80 MG tablet Take 1 tablet (80 mg total) by mouth daily. 10/25/22  Yes Jared Register, MD  Budeson-Glycopyrrol-Formoterol (BREZTRI AEROSPHERE) 160-9-4.8 MCG/ACT AERO Take 2 puffs first thing in morning and then another 2 puffs about 12 hours later. Patient taking differently: Inhale 2 puffs into the lungs in the morning and at bedtime. 08/06/22  Yes Nyoka Cowden, MD  ferrous sulfate 325 (65 FE) MG tablet Take 1 tablet (325 mg total) by mouth daily with breakfast. Patient taking differently: Take 325 mg by mouth every evening. 04/24/22  Yes Danis, Andreas Blower, MD  furosemide (LASIX) 40 MG tablet Take 1 tablet (40 mg total) by mouth 2 (two) times daily. 10/25/22  Yes Jared Register, MD  gabapentin (NEURONTIN) 300 MG capsule Take 1 capsule (300 mg total) by mouth 2 (two) times daily. 10/10/22  Yes Kathryne Hitch, MD   hydrALAZINE (APRESOLINE) 50 MG tablet Take 1 tablet (50 mg total) by mouth every 8 (eight) hours. Patient taking differently: Take 50 mg by mouth daily. 04/25/22  Yes Jared Register, MD  isosorbide mononitrate (IMDUR) 30 MG 24 hr  tablet Take 1 tablet (30 mg total) by mouth daily. 04/25/22  Yes Jared Register, MD  metoprolol succinate (TOPROL-XL) 50 MG 24 hr tablet Take 1 tablet (50 mg total) by mouth daily. Take with or immediately following a meal. 07/26/22  Yes Newlin, Enobong, MD  OXYGEN Inhale 2 L/min into the lungs continuous.   Yes [provider]  rivaroxaban (XARELTO) 20 MG TABS tablet Take 1 tablet (20 mg total) by mouth daily with supper. 10/25/22  Yes Jared Register, MD  Accu-Chek Softclix Lancets lancets Use as instructed daily before meals 12/18/21   Jared Register, MD  ergocalciferol (DRISDOL) 1.25 MG (50000 UT) capsule Take 1 capsule (50,000 Units total) by mouth once a week. Patient not taking: Reported on 12/04/2022 07/27/22   Jared Register, MD  ezetimibe (ZETIA) 10 MG tablet Take 1 tablet (10 mg total) by mouth daily. Patient not taking: Reported on 12/26/2022 01/31/22   Pricilla Riffle, MD  folic acid (FOLVITE) 1 MG tablet Take 1 tablet (1 mg total) by mouth daily. Patient not taking: Reported on 12/04/2022 10/09/22   Jared Register, MD  glucose blood (ACCU-CHEK GUIDE) test strip Use as instructed daily 12/18/21   Jared Register, MD  nitroGLYCERIN (NITROSTAT) 0.4 MG SL tablet Place 1 tablet (0.4 mg total) under the tongue every 5 (five) minutes as needed for chest pain. Patient not taking: Reported on 12/04/2022 11/19/20 03/21/22  Albertine Grates, MD  pantoprazole (PROTONIX) 40 MG tablet Take 1 tablet (40 mg total) by mouth daily. Patient not taking: Reported on 12/26/2022 10/25/22   Jared Register, MD  terbinafine (LAMISIL AT ATHLETES FOOT) 1 % cream Apply 1 Application topically 2 (two) times daily. Patient not taking: Reported on 12/26/2022 10/25/22   Jared Register, MD  TUDORZA  PRESSAIR 400 MCG/ACT AEPB INHALE 1 PUFF INTO THE LUNGS IN THE MORNING AND AT BEDTIME. 03/29/20 04/04/20  Nyoka Cowden, MD    Current Facility-Administered Medications  Medication Dose Route Frequency Provider Last Rate Last Admin   0.9 %  sodium chloride infusion (Manually program via Guardrails IV Fluids)   Intravenous Once Champ Mungo, DO       acetaminophen (TYLENOL) tablet 650 mg  650 mg Oral Q6H PRN Gwenevere Abbot, MD   650 mg at 12/26/22 2143   Or   acetaminophen (TYLENOL) suppository 650 mg  650 mg Rectal Q6H PRN Gwenevere Abbot, MD       albuterol (PROVENTIL) (2.5 MG/3ML) 0.083% nebulizer solution 3 mL  3 mL Inhalation Q6H PRN Gwenevere Abbot, MD       atorvastatin (LIPITOR) tablet 80 mg  80 mg Oral Daily Gwenevere Abbot, MD       ezetimibe (ZETIA) tablet 10 mg  10 mg Oral Daily Gwenevere Abbot, MD       fluticasone furoate-vilanterol (BREO ELLIPTA) 200-25 MCG/ACT 1 puff  1 puff Inhalation Daily Dickie La, MD       And   umeclidinium bromide (INCRUSE ELLIPTA) 62.5 MCG/ACT 1 puff  1 puff Inhalation Daily Dickie La, MD       gabapentin (NEURONTIN) capsule 300 mg  300 mg Oral BID Gwenevere Abbot, MD   300 mg at 12/26/22 2143   insulin aspart (novoLOG) injection 0-5 Units  0-5 Units Subcutaneous QHS Gwenevere Abbot, MD       insulin aspart (novoLOG) injection 0-9 Units  0-9 Units Subcutaneous TID WC Gwenevere Abbot, MD       ipratropium-albuterol (DUONEB) 0.5-2.5 (3) MG/3ML nebulizer solution 3 mL  3 mL Nebulization  Q6H PRN Gwenevere Abbot, MD       pantoprazole (PROTONIX) injection 40 mg  40 mg Intravenous Q12H Gwenevere Abbot, MD   40 mg at 12/26/22 2258   senna-docusate (Senokot-S) tablet 1 tablet  1 tablet Oral QHS PRN Gwenevere Abbot, MD        Allergies as of 12/26/2022 - Review Complete 12/26/2022  Allergen Reaction Noted   Lisinopril Cough 06/29/2014    Social History   Socioeconomic History   Marital status: Single    Spouse name: Not on file   Number of children: 1   Years of education: Not on  file   Highest education level: Not on file  Occupational History   Occupation: retired  Tobacco Use   Smoking status: Every Day    Current packs/day: 0.50    Average packs/day: 0.5 packs/day for 47.0 years (23.5 ttl pk-yrs)    Types: Cigarettes   Smokeless tobacco: Never   Tobacco comments:    4/5 cigs  Vaping Use   Vaping status: Never Used  Substance and Sexual Activity   Alcohol use: Not Currently    Alcohol/week: 0.0 standard drinks of alcohol    Comment: last drink was before xmas   Drug use: No    Types: Cocaine, Marijuana    Comment: "nothing in 20 years"   Sexual activity: Not Currently  Other Topics Concern   Not on file  Social History Narrative   unemployed   Social Determinants of Health   Financial Resource Strain: Low Risk  (05/25/2022)   Overall Financial Resource Strain (CARDIA)    Difficulty of Paying Living Expenses: Not hard at all  Food Insecurity: No Food Insecurity (12/26/2022)   Hunger Vital Sign    Worried About Running Out of Food in the Last Year: Never true    Ran Out of Food in the Last Year: Never true  Transportation Needs: No Transportation Needs (12/26/2022)   PRAPARE - Administrator, Civil Service (Medical): No    Lack of Transportation (Non-Medical): No  Physical Activity: Inactive (05/25/2022)   Exercise Vital Sign    Days of Exercise per Week: 0 days    Minutes of Exercise per Session: 0 min  Stress: No Stress Concern Present (05/25/2022)   [employer] of Occupational Health - Occupational Stress Questionnaire    Feeling of Stress : Not at all  Social Connections: Unknown (05/25/2022)   Social Connection and Isolation Panel [NHANES]    Frequency of Communication with Friends and Family: Three times a week    Frequency of Social Gatherings with Friends and Family: Never    Attends Religious Services: Not on Marketing executive or Organizations: No    Attends Banker Meetings: Never     Marital Status: Divorced  Catering manager Violence: Not At Risk (12/26/2022)   Humiliation, Afraid, Rape, and Kick questionnaire    Fear of Current or Ex-Partner: No    Emotionally Abused: No    Physically Abused: No    Sexually Abused: No     Code Status   Code Status: Full Code  Review of Systems: All systems reviewed and negative except where noted in HPI.  Physical Exam: Vital signs in last 24 hours: Temp:  [97.3 F (36.3 C)-98.9 F (37.2 C)] 98.1 F (36.7 C) (07/18 0744) Pulse Rate:  [76-105] 78 (07/18 0744) Resp:  [15-21] 18 (07/18 0744) BP: (116-158)/(67-85) 138/67 (07/18 0744) SpO2:  [93 %-100 %] 100 % (  07/18 0744) Weight:  [99.5 kg-100 kg] 99.5 kg (07/18 0500)    General:  Pleasant male in NAD Psych:  Cooperative. Normal mood and affect Eyes: Pupils equal Ears:  Normal auditory acuity Nose: No deformity, discharge or lesions Neck:  Supple, no masses felt Lungs:  Occasional expiratory wheezing.   Heart:  Regular rate.  Abdomen:  Soft, nondistended, nontender, active bowel sounds, no masses felt Rectal :  Deferred Msk: Symmetrical without gross deformities.  Neurologic:  Alert, oriented, grossly normal neurologically Extremities : No edema Skin:  Intact without significant lesions.    Intake/Output from previous day: 07/17 0701 - 07/18 0700 In: 1170 [Blood:1170] Out: 500 [Urine:500] Intake/Output this shift:  No intake/output data recorded.     Willette Cluster, NP-C @  12/27/2022, 8:42 AM

## 2022-12-27 NOTE — Hospital Course (Addendum)
#  Symptomatic anemia #Iron deficiency anemia #History of small bowel AVMs #History of gastroduodenal ulcer and H. pylori infection Patient presented with one week of progressive fatigue and dyspnea. He was found to have anemia to 5.9 with severe iron deficiency anemia. After receiving three units of pRBC, his hemoglobin increased to 8.3. He underwent an EGD with GI and this demonstrated a normal gastric and duodenal exam. His iron was replenished with IV ferric gluconate. At discharge, he endorses no signs or symptoms bleeding and a Hgb value of 7.9.   #Chronic respiratory failure secondary to COPD  Patient presented with progressive dyspnea and wheezing on examination. He was started on albuterol and DUONEB. He required 2 L of Rafael Capo O2 but this is his baseline requirement. At discharge, he was last satting at 97% and endorsed no signs or symptoms of shortness of breath.   #History of atrial fibrillation Coagulation was initially held due to bleeding concerns but resumed after EGD. Due to patient's history of recurrent anemia and GI bleeding, cardiology was consulted and scheduled an outpatient appointing for LAA occlusion.    #History of hyperlipidemia #Coronary artery disease At discharge, continue Zetia 10 mg and Lipitor 80 mg   #Type 2 diabetes Patient has a history of diabetes and was initiated on SSI for glucose control. Throughout his stay, his glucose was between 100-160. At discharge, his glucose was 166.  #Tobacco Use Disorder Patient required a nicotine patch prior to discharge.

## 2022-12-27 NOTE — Evaluation (Signed)
Occupational Therapy Evaluation Patient Details Name: Jared [name] MRN: [id number] DOB: 1956/05/18 Today's Date: 12/27/2022   History of Present Illness Pt is a 67 y.o. male who presented 12/26/22 with progressive fatigue and dyspnea. He was found to have anemia to 5.9 with severe iron deficiency. S/p 3 units PRBC. S/p EGD with GI 7/17 which showed normal gastric and duodenal exam. PMH includes AVM, chronic respiratory failure (2L O2 at home) CABG, CAD, CAP, CHF, COPD, CVA, HTN, A flutter, DM2   Clinical Impression   Pt evaluated s/p above admission list. Pt reports independence with ADL/IADLs and functional mobility with sister and medicaid providing transportation at baseline. Pt lives in a group home but reports staying to himself. Pt presents this session with decreased activity tolerance and SOB. Pt educated and provided handout on energy conservation techniques with pt verbalizing understanding. Pt currently requires setup A for seated UB ADLs and supervision A for LB ADLs. Pt completed functional transfers and mobility without AD with supervision. Pt would benefit from continued acute OT services to maximize functional independence and facilitate transition to pt's natural home environment with [facility] OT services upon discharge.   Session vitals: SpO2 desat to 87% on 4L with activity, recovered to >92% with seated rest and PLB. Returned to baseline 2L [place] at end of session with SpO2 96%.      Recommendations for follow up therapy are one component of a multi-disciplinary discharge planning process, led by the attending physician.  Recommendations may be updated based on patient status, additional functional criteria and insurance authorization.   Assistance Recommended at Discharge Set up Supervision/Assistance  Patient can return home with the following Assist for transportation;Help with stairs or ramp for entrance;Assistance with cooking/housework;A little help with walking and/or transfers;A lot  of help with bathing/dressing/bathroom    Functional Status Assessment  Patient has had a recent decline in their functional status and demonstrates the ability to make significant improvements in function in a reasonable and predictable amount of time.  Equipment Recommendations  None recommended by OT    Recommendations for Other Services       Precautions / Restrictions Precautions Precautions: Fall;Other (comment) Precaution Comments: watch SpO2 (on 2L baseline) Restrictions Weight Bearing Restrictions: No      Mobility Bed Mobility Overal bed mobility: Modified Independent             General bed mobility comments: HOB elevated, no assistance needed    Transfers Overall transfer level: Needs assistance Equipment used: None Transfers: Sit to/from Stand Sit to Stand: Supervision           General transfer comment: toilet transfer and room level mobility with supervision A for safety      Balance Overall balance assessment: Mild deficits observed, not formally tested                                         ADL either performed or assessed with clinical judgement   ADL Overall ADL's : Needs assistance/impaired Eating/Feeding: Independent;Sitting   Grooming: Supervision/safety;Wash/dry hands;Standing Grooming Details (indicate cue type and reason): standing sinkside Upper Body Bathing: Set up;Sitting   Lower Body Bathing: Supervison/ safety;Sit to/from stand   Upper Body Dressing : Set up;Sitting   Lower Body Dressing: Supervision/safety;Sit to/from stand Lower Body Dressing Details (indicate cue type and reason): able to achieve figure four position for LB dressing Toilet Transfer: Supervision/safety;Ambulation;Regular Toilet  Toileting- Clothing Manipulation and Hygiene: Supervision/safety;Sit to/from stand       Functional mobility during ADLs: Supervision/safety General ADL Comments: limited secondary to activity tolerance  and SOB     Vision Baseline Vision/History: 0 No visual deficits Ability to See in Adequate Light: 0 Adequate Vision Assessment?: No apparent visual deficits     Perception Perception Perception Tested?: No   Praxis Praxis Praxis tested?: Not tested    Pertinent Vitals/Pain Pain Assessment Pain Assessment: Faces Faces Pain Scale: Hurts little more Pain Location: L knee pain Pain Descriptors / Indicators: Discomfort, Guarding Pain Intervention(s): Limited activity within patient's tolerance, Monitored during session     Hand Dominance Right   Extremity/Trunk Assessment Upper Extremity Assessment Upper Extremity Assessment: Overall WFL for tasks assessed   Lower Extremity Assessment Lower Extremity Assessment: Defer to PT evaluation LLE Deficits / Details: pain with palpation at posterior knee; otherwise WFL strength, sensation, and coordination bil   Cervical / Trunk Assessment Cervical / Trunk Assessment: Normal   Communication Communication Communication: No difficulties   Cognition Arousal/Alertness: Awake/alert Behavior During Therapy: WFL for tasks assessed/performed Overall Cognitive Status: Within Functional Limits for tasks assessed                                 General Comments: Pleasant and cooperative     General Comments  SpO2 desat to 87% on 4L with activity, recovered to >92% with seated rest and PLB. Returned to baseline 2L [place] at end of session with SpO2 96%.    Exercises     Shoulder Instructions      Home Living Family/patient expects to be discharged to:: Private residence Living Arrangements: Non-relatives/Friends (boarding house) Available Help at Discharge: Family;Available 24 hours/day (sisters) Type of Home: House Home Access: Stairs to enter [employer] of Steps: 4 Entrance Stairs-Rails: Right;Left Home Layout: One level     Bathroom Shower/Tub: Chief Strategy Officer: Standard     Home  Equipment: Rollator (4 wheels);Rolling Walker (2 wheels);Shower seat;Grab bars - tub/shower   Additional Comments: 2L O2 all times      Prior Functioning/Environment Prior Level of Function : Independent/Modified Independent             Mobility Comments: Rollator in community, no AD in home; no falls in past 6 months ADLs Comments: Mod I with ADL/IADLs, sister or medicaid provides transportation        OT Problem List: Decreased activity tolerance;Cardiopulmonary status limiting activity;Pain      OT Treatment/Interventions: Self-care/ADL training;Therapeutic exercise;Energy conservation;DME and/or AE instruction;Therapeutic activities;Patient/family education;Balance training    OT Goals(Current goals can be found in the care plan section) Acute Rehab OT Goals Patient Stated Goal: to go home OT Goal Formulation: With patient Time For Goal Achievement: 01/10/23 Potential to Achieve Goals: Good ADL Goals Additional ADL Goal #1: Pt will complete all ADLs with mod I Additional ADL Goal #2: Pt will recall and implement 2+ energy conservation strategies during ADL tasks  OT Frequency: Min 2X/week    Co-evaluation              AM-PAC OT "6 Clicks" Daily Activity     Outcome Measure Help from another person eating meals?: None Help from another person taking care of personal grooming?: A Little Help from another person toileting, which includes using toliet, bedpan, or urinal?: A Little Help from another person bathing (including washing, rinsing, drying)?: A Little Help  from another person to put on and taking off regular upper body clothing?: A Little Help from another person to put on and taking off regular lower body clothing?: A Little 6 Click Score: 19   End of Session Equipment Utilized During Treatment: Oxygen Nurse Communication: Mobility status  Activity Tolerance: Patient tolerated treatment well Patient left: in bed;with call bell/phone within reach;with  bed alarm set  OT Visit Diagnosis: Unsteadiness on feet (R26.81);Pain                Time: 1610-9604 OT Time Calculation (min): 14 min Charges:  OT General Charges $OT Visit: 1 Visit OT Evaluation $OT Eval Moderate Complexity: 1 Mod  Sherley Bounds, OTS Acute Rehabilitation Services Office [phone] Secure Chat Communication Preferred   Sherley Bounds 12/27/2022, 3:52 PM

## 2022-12-27 NOTE — Care Management CC44 (Cosign Needed)
Condition Code 44 Documentation Completed  Patient Details  Name: Jared [name] MRN: [id number] Date of Birth: 12/03/1955   Condition Code 44 given:  Yes Patient signature on Condition Code 44 notice:  Yes Documentation of 2 MD's agreement:  Yes Code 44 added to claim:  Yes    Janae Bridgeman, RN 12/27/2022, 2:08 PM

## 2022-12-27 NOTE — Anesthesia Postprocedure Evaluation (Signed)
Anesthesia Post Note  Patient: Jared [name]  Procedure(s) Performed: ESOPHAGOGASTRODUODENOSCOPY (EGD) WITH PROPOFOL     Patient location during evaluation: Endoscopy Anesthesia Type: MAC Level of consciousness: awake Pain management: pain level controlled Vital Signs Assessment: post-procedure vital signs reviewed and stable Respiratory status: spontaneous breathing, nonlabored ventilation and respiratory function stable Cardiovascular status: blood pressure returned to baseline and stable Postop Assessment: no apparent nausea or vomiting Anesthetic complications: no   No notable events documented.  Last Vitals:  Vitals:   12/27/22 1006 12/27/22 1049  BP: 113/67 (!) 158/80  Pulse: 85 67  Resp: 18 15  Temp: (!) 36.4 C 36.4 C  SpO2: 96% 100%    Last Pain:  Vitals:   12/27/22 1030  TempSrc:   PainSc: 0-No pain                 Strother Everitt P Joscelyn Hardrick

## 2022-12-27 NOTE — Transfer of Care (Signed)
Immediate Anesthesia Transfer of Care Note  Patient: Jared [name]  Procedure(s) Performed: ESOPHAGOGASTRODUODENOSCOPY (EGD) WITH PROPOFOL  Patient Location: Endoscopy Unit  Anesthesia Type:MAC  Level of Consciousness: awake, alert , and oriented  Airway & Oxygen Therapy: Patient connected to nasal cannula oxygen  Post-op Assessment: Post -op Vital signs reviewed and stable  Post vital signs: stable  Last Vitals:  Vitals Value Taken Time  BP 113/67 12/27/22 1006  Temp 36.4 C 12/27/22 1006  Pulse 85 12/27/22 1006  Resp 18 12/27/22 1006  SpO2 96 % 12/27/22 1006    Last Pain:  Vitals:   12/27/22 1006  TempSrc: Temporal  PainSc: Asleep         Complications: No notable events documented.

## 2022-12-27 NOTE — Evaluation (Signed)
Physical Therapy Evaluation Patient Details Name: Jared [name] MRN: [id number] DOB: 06-Dec-1955 Today's Date: 12/27/2022  History of Present Illness  Pt is a 67 y.o. male who presented 12/26/22 with progressive fatigue and dyspnea. He was found to have anemia to 5.9 with severe iron deficiency. S/p 3 units PRBC. S/p EGD with GI 7/17 which showed normal gastric and duodenal exam. PMH includes AVM, chronic respiratory failure (2L O2 at home) CABG, CAD, CAP, CHF, COPD, CVA, HTN, A flutter, DM2   Clinical Impression  Pt presents with condition above and deficits mentioned below, see PT Problem List. PTA, he was living alone in a 1-level house with 4 STE. Pt is independent, utilizing a rollator in the community but no AD within the home. Currently, pt displays deficits in endurance and balance along with L posterior knee pain that results in gait deviations. He is able to ambulate without UE support or LOB at a supervision level though. He could benefit from follow-up with HHPT to address the deficits mentioned above. Will continue to follow acutely.        Assistance Recommended at Discharge PRN  If plan is discharge home, recommend the following:  Can travel by private vehicle  Assist for transportation;Assistance with cooking/housework        Equipment Recommendations None recommended by PT  Recommendations for Other Services       Functional Status Assessment Patient has had a recent decline in their functional status and demonstrates the ability to make significant improvements in function in a reasonable and predictable amount of time.     Precautions / Restrictions Precautions Precautions: Fall;Other (comment) Precaution Comments: watch SpO2 (on 2L baseline) Restrictions Weight Bearing Restrictions: No      Mobility  Bed Mobility Overal bed mobility: Modified Independent             General bed mobility comments: HOB elevated, no assistance needed    Transfers Overall  transfer level: Needs assistance Equipment used: None Transfers: Sit to/from Stand Sit to Stand: Supervision           General transfer comment: Supervision for safety, no LOB    Ambulation/Gait Ambulation/Gait assistance: Supervision Gait Distance (Feet): 60 Feet (x2 bouts of ~60 ft > ~20 ft) Assistive device: None Gait Pattern/deviations: Step-through pattern, Decreased stance time - left, Decreased stride length, Knee flexed in stance - left, Trunk flexed, Antalgic Gait velocity: reduced Gait velocity interpretation: 1.31 - 2.62 ft/sec, indicative of limited community ambulator   General Gait Details: Pt with antalgic gait pattern with reduced L stance time and L knee flexed in stance phase due to L posterior knee pain. No overt LOB, supervision for safety ambulating laps within the room.  Stairs            Wheelchair Mobility     Tilt Bed    Modified Rankin (Stroke Patients Only)       Balance Overall balance assessment: Mild deficits observed, not formally tested                                           Pertinent Vitals/Pain Pain Assessment Pain Assessment: 0-10 Pain Score: 6  Pain Location: headache, L posterior knee pain Pain Descriptors / Indicators: Headache, Discomfort, Tender Pain Intervention(s): Limited activity within patient's tolerance, Monitored during session, Repositioned    Home Living Family/patient expects to be discharged to:: Private residence Living  Arrangements: Alone Available Help at Discharge: Family;Available 24 hours/day (sisters) Type of Home: House Home Access: Stairs to enter Entrance Stairs-Rails: Doctor, general practice of Steps: 4   Home Layout: One level Home Equipment: Rollator (4 wheels);Rolling Walker (2 wheels);Shower seat;Grab bars - tub/shower Additional Comments: 2L O2 all times    Prior Function Prior Level of Function : Independent/Modified Independent              Mobility Comments: Rollator in community, no AD in home; no falls in past 6 months ADLs Comments: relies on sister or medicaid transportation services to get to appointments     Hand Dominance   Dominant Hand: Right    Extremity/Trunk Assessment   Upper Extremity Assessment Upper Extremity Assessment: Defer to OT evaluation    Lower Extremity Assessment Lower Extremity Assessment: LLE deficits/detail LLE Deficits / Details: pain with palpation at posterior knee; otherwise WFL strength, sensation, and coordination bil    Cervical / Trunk Assessment Cervical / Trunk Assessment: Normal  Communication   Communication: No difficulties  Cognition Arousal/Alertness: Awake/alert Behavior During Therapy: WFL for tasks assessed/performed Overall Cognitive Status: Within Functional Limits for tasks assessed                                          General Comments General comments (skin integrity, edema, etc.): SpO2 >/= 90% on 4L when ambulating, >/= 92% on 2L at rest    Exercises     Assessment/Plan    PT Assessment Patient needs continued PT services  PT Problem List Decreased activity tolerance;Decreased balance;Decreased mobility;Pain       PT Treatment Interventions DME instruction;Gait training;Stair training;Functional mobility training;Therapeutic activities;Therapeutic exercise;Balance training;Neuromuscular re-education;Patient/family education    PT Goals (Current goals can be found in the Care Plan section)  Acute Rehab PT Goals Patient Stated Goal: to get [facility] therapy PT Goal Formulation: With patient Time For Goal Achievement: 01/10/23 Potential to Achieve Goals: Good    Frequency Min 3X/week     Co-evaluation               AM-PAC PT "6 Clicks" Mobility  Outcome Measure Help needed turning from your back to your side while in a flat bed without using bedrails?: None Help needed moving from lying on your back to sitting on the side  of a flat bed without using bedrails?: None Help needed moving to and from a bed to a chair (including a wheelchair)?: A Little Help needed standing up from a chair using your arms (e.g., wheelchair or bedside chair)?: A Little Help needed to walk in hospital room?: A Little Help needed climbing 3-5 steps with a railing? : A Little 6 Click Score: 20    End of Session Equipment Utilized During Treatment: Oxygen Activity Tolerance: Patient tolerated treatment well Patient left: Other (comment) (with OT in bathroom)   PT Visit Diagnosis: Unsteadiness on feet (R26.81);Other abnormalities of gait and mobility (R26.89);Pain;Difficulty in walking, not elsewhere classified (R26.2) Pain - Right/Left: Left Pain - part of body: Knee    Time: 1411-1440 PT Time Calculation (min) (ACUTE ONLY): 29 min   Charges:   PT Evaluation $PT Eval Moderate Complexity: 1 Mod PT Treatments $Therapeutic Activity: 8-22 mins PT General Charges $$ ACUTE PT VISIT: 1 Visit         Raymond Gurney, PT, DPT Acute Rehabilitation Services  Office: [phone]   Henrene Dodge  Pettis 12/27/2022, 2:52 PM

## 2022-12-27 NOTE — Anesthesia Preprocedure Evaluation (Addendum)
Anesthesia Evaluation  Patient identified by MRN, date of birth, ID band Patient awake    Reviewed: Allergy & Precautions, NPO status , Patient's Chart, lab work & pertinent test results  Airway Mallampati: II  TM Distance: >3 FB Neck ROM: Full    Dental  (+) Missing   Pulmonary COPD,  COPD inhaler and oxygen dependent, Current Smoker and Patient abstained from smoking.   Pulmonary exam normal        Cardiovascular hypertension, Pt. on medications and Pt. on home beta blockers + CAD, + Peripheral Vascular Disease and +CHF  Normal cardiovascular exam     Neuro/Psych  Headaches CVA  negative psych ROS   GI/Hepatic Neg liver ROS, PUD,,,  Endo/Other  diabetes    Renal/GU negative Renal ROS     Musculoskeletal negative musculoskeletal ROS (+)    Abdominal   Peds  Hematology  (+) Blood dyscrasia (Xarelto), anemia   Anesthesia Other Findings gi bleed  Reproductive/Obstetrics                             Anesthesia Physical Anesthesia Plan  ASA: 4  Anesthesia Plan: MAC   Post-op Pain Management:    Induction: Intravenous  PONV Risk Score and Plan: 0 and Propofol infusion and Treatment may vary due to age or medical condition  Airway Management Planned: Nasal Cannula  Additional Equipment:   Intra-op Plan:   Post-operative Plan:   Informed Consent: I have reviewed the patients History and Physical, chart, labs and discussed the procedure including the risks, benefits and alternatives for the proposed anesthesia with the patient or authorized representative who has indicated his/her understanding and acceptance.     Dental advisory given  Plan Discussed with: CRNA  Anesthesia Plan Comments:        Anesthesia Quick Evaluation

## 2022-12-27 NOTE — Plan of Care (Signed)
  Problem: Education: Goal: Understanding of CV disease, CV risk reduction, and recovery process will improve Outcome: Progressing Goal: Individualized Educational Video(s) Outcome: Progressing   Problem: Activity: Goal: Ability to return to baseline activity level will improve Outcome: Progressing   Problem: Cardiovascular: Goal: Ability to achieve and maintain adequate cardiovascular perfusion will improve Outcome: Progressing Goal: Vascular access site(s) Level 0-1 will be maintained Outcome: Progressing   Problem: Health Behavior/Discharge Planning: Goal: Ability to safely manage health-related needs after discharge will improve Outcome: Progressing   Problem: Education: Goal: Knowledge of General Education information will improve Description: Including pain rating scale, medication(s)/side effects and non-pharmacologic comfort measures Outcome: Progressing   Problem: Health Behavior/Discharge Planning: Goal: Ability to manage health-related needs will improve Outcome: Progressing   Problem: Clinical Measurements: Goal: Ability to maintain clinical measurements within normal limits will improve Outcome: Progressing Goal: Will remain free from infection Outcome: Progressing Goal: Diagnostic test results will improve Outcome: Progressing Goal: Respiratory complications will improve Outcome: Progressing Goal: Cardiovascular complication will be avoided Outcome: Progressing   Problem: Activity: Goal: Risk for activity intolerance will decrease Outcome: Progressing   Problem: Nutrition: Goal: Adequate nutrition will be maintained Outcome: Progressing   Problem: Coping: Goal: Level of anxiety will decrease Outcome: Progressing   Problem: Elimination: Goal: Will not experience complications related to bowel motility Outcome: Progressing Goal: Will not experience complications related to urinary retention Outcome: Progressing   Problem: Pain Managment: Goal:  General experience of comfort will improve Outcome: Progressing   Problem: Safety: Goal: Ability to remain free from injury will improve Outcome: Progressing   

## 2022-12-27 NOTE — Care Management Obs Status (Cosign Needed)
MEDICARE OBSERVATION STATUS NOTIFICATION   Patient Details  Name: Jared [name] MRN: [id number] Date of Birth: [birth year]   Medicare Observation Status Notification Given:  Yes    Janae Bridgeman, RN 12/27/2022, 2:08 PM

## 2022-12-27 NOTE — Progress Notes (Addendum)
Transition of Care [facility]) - Inpatient Brief Assessment   Patient Details  Name: Pape Parson MRN: [id number] Date of Birth: 1956-02-10  Transition of Care [facility]) CM/SW Contact:    Janae Bridgeman, RN Phone Number: 12/27/2022, 2:20 PM   Clinical Narrative: CM met with the patient at the bedside to discuss TOC needs.  The patient lives alone at a rooming house.  Patient admitted for R/O Gi Bleed.  Medicare Observation and Code 44 provided to the patient.  Patient has home oxygen at the home 2L/min [place] through [employer].  Other DME at the home includes RW.  Patient is a current smoker.  Patient states that he was a [employer] in the past but is not active with the [place].  Patient states that he believes he became sick from living at [facility] and drank contaminated drinking water.  Patient states his sister assists with transportation.  Patient has availability through [facility] and Medicaid for transportation assistance.  12/27/22 1531 - CM met with the patient at the bedside to discuss Medicare choice regarding home health services.  Patient states that [facility] was fine in the past.  I called Audrea Muscat, cM with [facility] and she accepted for services.  HH orders for services for RN, PT, OT and MSW was placed to be co-signed by the attending MD.   Transition of Care Asessment: Insurance and Status: (P) Insurance coverage has been reviewed Patient has primary care physician: (P) Yes Home environment has been reviewed: (P) Yes - lives in rooming house Prior level of function:: (P) Independent Prior/Current Home Services: (P) Current home services (Home oxygent through [employer]) Social Determinants of Health Reivew: (P) SDOH reviewed no interventions necessary Readmission risk has been reviewed: (P) Yes Transition of care needs: (P) no transition of care needs at this time

## 2022-12-27 NOTE — Op Note (Signed)
[facility] Patient Name: Jared [name] Procedure Date : 12/27/2022 MRN: [id number] Attending MD: Napoleon Form , MD, [id number] Date of Birth: 30-May-1956 CSN: [id number] Age: 67 Admit Type: Inpatient Procedure:                Upper GI endoscopy Indications:              Suspected upper gastrointestinal bleeding,                            Suspected upper gastrointestinal bleeding in                            patient with chronic blood loss Providers:                Napoleon Form, MD, Margaree Mackintosh, RN,                            Priscella Mann, Technician Referring MD:              Medicines:                Monitored Anesthesia Care Complications:            No immediate complications. Estimated Blood Loss:     Estimated blood loss: none. Procedure:                Pre-Anesthesia Assessment:                           - Prior to the procedure, a History and Physical                            was performed, and patient medications and                            allergies were reviewed. The patient's tolerance of                            previous anesthesia was also reviewed. The risks                            and benefits of the procedure and the sedation                            options and risks were discussed with the patient.                            All questions were answered, and informed consent                            was obtained. Prior Anticoagulants: The patient has                            taken no anticoagulant or antiplatelet agents. ASA  Grade Assessment: III - A patient with severe                            systemic disease. After reviewing the risks and                            benefits, the patient was deemed in satisfactory                            condition to undergo the procedure.                           After obtaining informed consent, the endoscope was                             passed under direct vision. Throughout the                            procedure, the patient's blood pressure, pulse, and                            oxygen saturations were monitored continuously. The                            [id number]-[id number] ([id number]) Olympus endoscope was introduced                            through the mouth, and advanced to the fourth part                            of duodenum. The upper GI endoscopy was                            accomplished without difficulty. The patient                            tolerated the procedure well. Scope In: Scope Out: Findings:      The Z-line was irregular and was found 38 cm from the incisors.      The exam of the esophagus was otherwise normal.      An endoclip was found in the cardia.      The exam of the stomach was otherwise normal.      The cardia and gastric fundus were normal on retroflexion.      The examined duodenum was normal. Impression:               - Z-line irregular, 38 cm from the incisors.                           - An endoclip was found in the stomach.                           - Normal examined duodenum.                           -  No specimens collected. Recommendation:           - Resume previous diet.                           - Continue present medications.                           - IV iron infusion for severe iron deficiency                           - Follow up with PCP and hematology for routine                            monitoring of Hgb and iron panel monthly with IV                            iron infusion as needed for chronic iron deficiency                            anemia                           - GI will sign off, please call with any questions Procedure Code(s):        --- Professional ---                           [phone], Esophagogastroduodenoscopy, flexible,                            transoral; diagnostic, including collection of                            specimen(s) by brushing or  washing, when performed                            (separate procedure) Diagnosis Code(s):        --- Professional ---                           K22.89, Other specified disease of esophagus                           T18.2XXA, Foreign body in stomach, initial encounter                           R58, Hemorrhage, not elsewhere classified CPT copyright 2022 American Medical Association. All rights reserved. The codes documented in this report are preliminary and upon coder review may  be revised to meet current compliance requirements. Napoleon Form, MD 12/27/2022 10:23:15 AM This report has been signed electronically. Number of Addenda: 0

## 2022-12-28 ENCOUNTER — Encounter (HOSPITAL_COMMUNITY): Payer: Self-pay | Admitting: Gastroenterology

## 2022-12-28 DIAGNOSIS — E119 Type 2 diabetes mellitus without complications: Secondary | ICD-10-CM | POA: Diagnosis not present

## 2022-12-28 DIAGNOSIS — D509 Iron deficiency anemia, unspecified: Secondary | ICD-10-CM | POA: Diagnosis not present

## 2022-12-28 DIAGNOSIS — I2510 Atherosclerotic heart disease of native coronary artery without angina pectoris: Secondary | ICD-10-CM | POA: Diagnosis not present

## 2022-12-28 DIAGNOSIS — J9610 Chronic respiratory failure, unspecified whether with hypoxia or hypercapnia: Secondary | ICD-10-CM | POA: Diagnosis not present

## 2022-12-28 DIAGNOSIS — J449 Chronic obstructive pulmonary disease, unspecified: Secondary | ICD-10-CM | POA: Diagnosis not present

## 2022-12-28 DIAGNOSIS — T182XXA Foreign body in stomach, initial encounter: Secondary | ICD-10-CM | POA: Diagnosis not present

## 2022-12-28 LAB — BPAM RBC
Blood Product Expiration Date: 202408192359
Blood Product Expiration Date: 202408192359
ISSUE DATE / TIME: 202407171347
ISSUE DATE / TIME: 202407180226
Unit Type and Rh: 5100
Unit Type and Rh: 5100

## 2022-12-28 LAB — TYPE AND SCREEN
ABO/RH(D): O POS
Antibody Screen: NEGATIVE
Unit division: 0
Unit division: 0

## 2022-12-28 LAB — CBC
HCT: 28.2 % — ABNORMAL LOW (ref 39.0–52.0)
Hemoglobin: 7.9 g/dL — ABNORMAL LOW (ref 13.0–17.0)
MCH: 23.5 pg — ABNORMAL LOW (ref 26.0–34.0)
MCHC: 28 g/dL — ABNORMAL LOW (ref 30.0–36.0)
MCV: 83.9 fL (ref 80.0–100.0)
Platelets: 391 10*3/uL (ref 150–400)
RBC: 3.36 MIL/uL — ABNORMAL LOW (ref 4.22–5.81)
RDW: 19.4 % — ABNORMAL HIGH (ref 11.5–15.5)
WBC: 12.7 10*3/uL — ABNORMAL HIGH (ref 4.0–10.5)
nRBC: 2.5 % — ABNORMAL HIGH (ref 0.0–0.2)

## 2022-12-28 LAB — BASIC METABOLIC PANEL
Anion gap: 9 (ref 5–15)
BUN: 15 mg/dL (ref 8–23)
CO2: 31 mmol/L (ref 22–32)
Calcium: 8.7 mg/dL — ABNORMAL LOW (ref 8.9–10.3)
Chloride: 98 mmol/L (ref 98–111)
Creatinine, Ser: 1 mg/dL (ref 0.61–1.24)
GFR, Estimated: >60 mL/min (ref >60)
Glucose, Bld: 132 mg/dL — ABNORMAL HIGH (ref 70–99)
Potassium: 3.7 mmol/L (ref 3.5–5.1)
Sodium: 138 mmol/L (ref 135–145)

## 2022-12-28 MED ORDER — NICOTINE 21 MG/24HR TD PT24
MEDICATED_PATCH | TRANSDERMAL | Status: AC
  Filled 2022-12-28: qty 1

## 2022-12-28 MED ORDER — NICOTINE 21 MG/24HR TD PT24
21.0000 mg | MEDICATED_PATCH | Freq: Every day | TRANSDERMAL | Status: DC
  Administered 2022-12-28: 21 mg via TRANSDERMAL

## 2022-12-28 NOTE — Plan of Care (Signed)

## 2022-12-28 NOTE — Progress Notes (Signed)
Patient frustrated because he states he has not been receiving his "Bresi" inhaler for COPD. He stated "Francesca Oman are only concerned about the bleeding I don't have and not my COPD. I have been in this hospital multiple times and never had trouble getting the medications I need for COPD." He is also requesting a nicotine patch. I  sent a secure chat to Dr. Hassan Rowan and  she has seen the message. Awaiting response.

## 2022-12-28 NOTE — Discharge Summary (Addendum)
Name: Jared [name] MRN: [id number] DOB: [birth year] 67 y.o. PCP: [name], MD  Date of Admission: 12/26/2022  9:24 AM Date of Discharge:  7/19/247 Attending Physician: Dr.  Sol Blazing  DISCHARGE DIAGNOSIS:  Primary Problem: Symptomatic anemia   Hospital Problems: Principal Problem:   Symptomatic anemia Active Problems:   Atrial fibrillation (HCC)    DISCHARGE MEDICATIONS:   Allergies as of 12/28/2022       Reactions   Lisinopril Cough        Medication List     STOP taking these medications    terbinafine 1 % cream Commonly known as: LamISIL AT [employer]       TAKE these medications    Accu-Chek Aviva Plus test strip Generic drug: glucose blood Use as instructed daily   Accu-Chek Softclix Lancets lancets Use as instructed daily before meals   acetaminophen 500 MG tablet Commonly known as: TYLENOL Take 1,000 mg by mouth every 6 (six) hours as needed for moderate pain.   albuterol 108 (90 Base) MCG/ACT inhaler Commonly known as: Ventolin HFA INHALE 2 PUFFS INTO THE LUNGS EVERY 6 (SIX) HOURS AS NEEDED FOR WHEEZING OR SHORTNESS OF BREATH. What changed: when to take this   aspirin EC 81 MG tablet Take 1 tablet (81 mg total) by mouth daily. Swallow whole.   atorvastatin 80 MG tablet Commonly known as: LIPITOR Take 1 tablet (80 mg total) by mouth daily.   Breztri Aerosphere 160-9-4.8 MCG/ACT Aero Generic drug: Budeson-Glycopyrrol-Formoterol Take 2 puffs first thing in morning and then another 2 puffs about 12 hours later. What changed:  how much to take how to take this when to take this additional instructions   ezetimibe 10 MG tablet Commonly known as: ZETIA Take 1 tablet (10 mg total) by mouth daily.   FeroSul 325 (65 Fe) MG tablet Generic drug: ferrous sulfate Take 1 tablet (325 mg total) by mouth daily with breakfast. What changed: when to take this   folic acid 1 MG tablet Commonly known as: FOLVITE Take 1 tablet (1 mg total) by  mouth daily.   furosemide 40 MG tablet Commonly known as: LASIX Take 1 tablet (40 mg total) by mouth 2 (two) times daily.   gabapentin 300 MG capsule Commonly known as: NEURONTIN Take 1 capsule (300 mg total) by mouth 2 (two) times daily.   hydrALAZINE 50 MG tablet Commonly known as: APRESOLINE Take 1 tablet (50 mg total) by mouth every 8 (eight) hours. What changed: when to take this   isosorbide mononitrate 30 MG 24 hr tablet Commonly known as: IMDUR Take 1 tablet (30 mg total) by mouth daily.   metoprolol succinate 50 MG 24 hr tablet Commonly known as: TOPROL-XL Take 1 tablet (50 mg total) by mouth daily. Take with or immediately following a meal.   nitroGLYCERIN 0.4 MG SL tablet Commonly known as: NITROSTAT Place 1 tablet (0.4 mg total) under the tongue every 5 (five) minutes as needed for chest pain.   OXYGEN Inhale 2 L/min into the lungs continuous.   pantoprazole 40 MG tablet Commonly known as: Protonix Take 1 tablet (40 mg total) by mouth daily.   Vitamin D (Ergocalciferol) 1.25 MG (50000 UNIT) Caps capsule Commonly known as: Drisdol Take 1 capsule (50,000 Units total) by mouth once a week.   Xarelto 20 MG Tabs tablet Generic drug: rivaroxaban Take 1 tablet (20 mg total) by mouth daily with supper.        DISPOSITION AND FOLLOW-UP:  [name] was discharged  from [facility] in stable condition. At the hospital follow up visit please address:  Follow-up Recommendations: Follow-Up: Symptomatic anemia and iron deficiency anemia: Assess if he has been taking his iron supplement, if has had any signs of bleeding (darkened/tarry stools), and hemoglobin and iron studies; consider outpatient iron infusion Chronic respiratory failure 2/2 COPD: Assess if he has had any dyspnea or changes in his baseline oxygen requirements History of atrial fibrillation: Assess if he has been taking his Xarelto; scheduled to visit cardiology in October for LAA  occlusion candidacy  Consults: None Labs: CBC, Iron Studies: None Medications: n/a  Follow-up Appointments:  Follow-up Information     [facility] Follow up.   Specialty: Home Health Services Why: [facility] will provide home health services.. They will call you to set up services in the next 24-48 hours. Contact information: [address] [place] [address] [phone]         [name], MD. Call.   Specialty: Family Medicine Contact information: [address] [place] [address] [phone]               01/15/23 at 8 AM with Dr. Lenell Antu in Vascular Surgery 02/04/23 at 910 AM with Dr. Alvis Lemmings in Internal Medicine  03/21/23 at 10 AM with Dr. Tenny Craw in Cardiology  HOSPITAL COURSE:  Patient Summary:  #Symptomatic anemia #Iron deficiency anemia #History of small bowel AVMs #History of gastroduodenal ulcer and H. pylori infection Patient presented with one week of progressive fatigue and dyspnea. He was found to have anemia to 5.9 with severe iron deficiency anemia. After receiving three units of pRBC, his hemoglobin increased to 8.3. He underwent an EGD with GI and this demonstrated a normal gastric and duodenal exam. He received IV ferric gluconate x1. Attempted to administer a second dose of IV iron prior to discharge, however patient declined. At discharge, he endorses no signs or symptoms bleeding and Hgb value was 7.9.   #Chronic respiratory failure secondary to COPD  Patient presented with progressive dyspnea and wheezing on examination. He was started on albuterol and DUONEB. He required 2 L of [place] O2 but this is his baseline requirement. At discharge, he was last satting at 97% and endorsed no signs or symptoms of shortness of breath.   #History of atrial fibrillation Coagulation was initially held due to bleeding concerns but resumed after EGD. Due to patient's history of recurrent anemia and  GI bleeding, cardiology was consulted and scheduled an outpatient appointment for LAA occlusion.    #History of hyperlipidemia #Coronary artery disease At discharge, continue Zetia 10 mg and Lipitor 80 mg   #Type 2 diabetes Patient has a history of diabetes and was initiated on SSI for glucose control. Throughout his stay, his glucose was between 100-160. At discharge, his glucose was 166.  #Tobacco Use Disorder Patient required a nicotine patch prior to discharge.    DISCHARGE INSTRUCTIONS:   Discharge Instructions     Call MD for:  difficulty breathing, headache or visual disturbances   Complete by: As directed    Call MD for:  persistant dizziness or light-headedness   Complete by: As directed    Diet - low sodium heart healthy   Complete by: As directed    Discharge instructions   Complete by: As directed    Mr. Paredez,  You were hospitalized for anemia that was most likely caused by bleeding. We gave you blood to  fix your anemia. Your iron levels were also low, so we gave you iron as well.   You also had an EGD to see if you had any bleeding in your stomach, but we found no bleeding after this procedure. Please follow-up with your primary care doctor to check your hemoglobin and iron levels. Please continue taking your iron supplement everyday.   We also reached out to your heart doctors as we believe you would be a good fit for a device that is helpful in patients such as yourself with a history of anemia and GI bleeds.  Please *CONTINUE* to take the following medications:  - Ferrous sulfate 325 mg by mouth with breakfast - Xarelto 20 mg tablet by mouth daily with supper - Aspirin 81 mg tablet by mouth daily - Atorvastatin 80 mg tablet by mouth daily - Ezetimibe 10 mg tablet by mouth daily - Folic acid 1 mg tablet by mouth daily - Lasix 40 mg tablet by mouth two times daily  - Gabapentin 300 mg capsule by mouth two times daily - Hydralazine 50 mg tablet by mouth every  eight hours  - Imdur 30 mg tablet by mouth daily  - Toprol-XL 50 mg tablet by mouth daily  - Protonix 40 mg tablet by mouth daily  - Breztri Aerosphere 2 puffs in the morning and then 2 puffs 12 hours later - Ergocalciferol 1.25 capsule by mouth once a week   Please schedule a follow-up appointment with your PCP in the next week for a hospital follow-up. You are scheduled for an appointment with vascular surgery at 8/6 at 8 AM. You are also scheduled for an appointment with cardiology on 10/10 at 10 AM.  If you have any questions, please reach out to [place] at [phone]. We are glad you're feeling better!   Increase activity slowly   Complete by: As directed        SUBJECTIVE:  Patient was evaluated at bedside. Overnight, he endorsed a headache. He denies any nausea, vomiting, chest pain, or other symptoms.   Discharge Vitals:   BP 126/67 (BP Location: Left Arm)   Pulse 85   Temp 97.6 F (36.4 C) (Oral)   Resp 18   Ht 6\' 3"  (1.905 m)   Wt 99.5 kg   SpO2 97%   BMI 27.42 kg/m   OBJECTIVE:  Physical Exam Constitutional:      Appearance: Normal appearance.  HENT:     Head: Normocephalic and atraumatic.  Pulmonary:     Effort: Pulmonary effort is normal.  Neurological:     General: No focal deficit present.     Mental Status: He is alert.  Psychiatric:        Mood and Affect: Affect is angry.    Pertinent Labs, Studies, and Procedures:     Latest Ref Rng & Units 12/28/2022    3:09 AM 12/27/2022    9:16 AM 12/27/2022    8:16 AM  CBC  WBC 4.0 - 10.5 K/uL 12.7   12.1   Hemoglobin 13.0 - 17.0 g/dL 7.9  9.9  8.3   Hematocrit 39.0 - 52.0 % 28.2  29.0  28.0   Platelets 150 - 400 K/uL 391   391        Latest Ref Rng & Units 12/27/2022    9:16 AM 12/26/2022   11:45 PM 12/26/2022    9:47 AM  CMP  Glucose 70 - 99 mg/dL 454  098    BUN 8 - 23  mg/dL 16  15    Creatinine 4.09 - 1.24 mg/dL 8.11  9.14    Sodium 782 - 145 mmol/L 139  136  140   Potassium 3.5 - 5.1 mmol/L 4.1   4.3  3.7   Chloride 98 - 111 mmol/L 100  97    CO2 22 - 32 mmol/L  29    Calcium 8.9 - 10.3 mg/dL  8.8      DG Chest Port 1 View  Result Date: 12/26/2022 CLINICAL DATA:  Shortness of breath and weakness for the past 2 days. EXAM: PORTABLE CHEST 1 VIEW COMPARISON:  Chest x-ray dated March 21, 2022. FINDINGS: The heart size and mediastinal contours are within normal limits. Both lungs are clear. No acute osseous abnormality. Old left-sided rib fractures again noted. IMPRESSION: No active disease. Electronically Signed   By: Obie Dredge M.D.   On: 12/26/2022 10:17    Signed: Morrie Sheldon, MD Internal Medicine Resident, PGY-1 [facility]  Pager: [phone]

## 2022-12-28 NOTE — Progress Notes (Signed)
Occupational Therapy Treatment Patient Details Name: Jared [name] MRN: [id number] DOB: 1955-12-08 Today's Date: 12/28/2022   History of present illness Pt is a 67 y.o. male who presented 12/26/22 with progressive fatigue and dyspnea. He was found to have anemia to 5.9 with severe iron deficiency. S/p 3 units PRBC. S/p EGD with GI 7/17 which showed normal gastric and duodenal exam. PMH includes AVM, chronic respiratory failure (2L O2 at home) CABG, CAD, CAP, CHF, COPD, CVA, HTN, A flutter, DM2   OT comments  Pt progressing toward established OT goals. RN in room offering unit of blood at start of session and pt deferring reporting ready to go home despite education regarding benefits. Pt dressing with set-up/supervision this session in prep for home and able to maintain O2 line throughout. One desat requiring increased time to recover after LB dressing. Pt provided energy conservation handout and reiterated main concepts of energy conservation. Will continue to follow and recommending discharge home with HHOT.    Recommendations for follow up therapy are one component of a multi-disciplinary discharge planning process, led by the attending physician.  Recommendations may be updated based on patient status, additional functional criteria and insurance authorization.    Assistance Recommended at Discharge Set up Supervision/Assistance  Patient can return home with the following  Assist for transportation;Help with stairs or ramp for entrance;Assistance with cooking/housework;A little help with walking and/or transfers;A lot of help with bathing/dressing/bathroom   Equipment Recommendations  None recommended by OT    Recommendations for Other Services      Precautions / Restrictions Precautions Precautions: Fall;Other (comment) Precaution Comments: watch SpO2 (on 2L baseline) Restrictions Weight Bearing Restrictions: No       Mobility Bed Mobility Overal bed mobility: Modified Independent                   Transfers Overall transfer level: Needs assistance Equipment used: None Transfers: Sit to/from Stand Sit to Stand: Supervision           General transfer comment: for safety only     Balance Overall balance assessment: Mild deficits observed, not formally tested                                         ADL either performed or assessed with clinical judgement   ADL Overall ADL's : Needs assistance/impaired                 Upper Body Dressing : Set up;Sitting Upper Body Dressing Details (indicate cue type and reason): donned shirt in prep for discharge Lower Body Dressing: Supervision/safety;Sit to/from stand Lower Body Dressing Details (indicate cue type and reason): able to achieve figure four position for LB dressing. Donned pants and shoes             Functional mobility during ADLs: Supervision/safety      Extremity/Trunk Assessment Upper Extremity Assessment Upper Extremity Assessment: Overall WFL for tasks assessed   Lower Extremity Assessment Lower Extremity Assessment: Defer to PT evaluation        Vision   Vision Assessment?: No apparent visual deficits   Perception     Praxis      Cognition Arousal/Alertness: Awake/alert Behavior During Therapy: WFL for tasks assessed/performed Overall Cognitive Status: Within Functional Limits for tasks assessed  General Comments: Pleasant and cooperative        Exercises      Shoulder Instructions       General Comments SpO2 desat to 88 with dressing; pt reporting he has not yet had his inhaler this morning and was relatively irritated about it; however, remained pleasant    Pertinent Vitals/ Pain       Pain Assessment Pain Assessment: Faces Faces Pain Scale: Hurts a little bit Pain Location: L knee pain Pain Descriptors / Indicators: Discomfort, Guarding Pain Intervention(s): Limited activity within  patient's tolerance, Monitored during session  Home Living                                          Prior Functioning/Environment              Frequency  Min 2X/week        Progress Toward Goals  OT Goals(current goals can now be found in the care plan section)  Progress towards OT goals: Progressing toward goals  Acute Rehab OT Goals Patient Stated Goal: go home OT Goal Formulation: With patient Time For Goal Achievement: 01/10/23 Potential to Achieve Goals: Good ADL Goals Additional ADL Goal #1: Pt will complete all ADLs with mod I Additional ADL Goal #2: Pt will recall and implement 2+ energy conservation strategies during ADL tasks  Plan Discharge plan remains appropriate;Frequency remains appropriate    Co-evaluation                 AM-PAC OT "6 Clicks" Daily Activity     Outcome Measure   Help from another person eating meals?: None Help from another person taking care of personal grooming?: A Little Help from another person toileting, which includes using toliet, bedpan, or urinal?: A Little Help from another person bathing (including washing, rinsing, drying)?: A Little Help from another person to put on and taking off regular upper body clothing?: A Little Help from another person to put on and taking off regular lower body clothing?: A Little 6 Click Score: 19    End of Session Equipment Utilized During Treatment: Oxygen  OT Visit Diagnosis: Unsteadiness on feet (R26.81);Pain   Activity Tolerance Patient tolerated treatment well   Patient Left in bed;with call bell/phone within reach   Nurse Communication Mobility status        Time: 1253-1311 OT Time Calculation (min): 18 min  Charges: OT General Charges $OT Visit: 1 Visit OT Treatments $Self Care/Home Management : 8-22 mins  Tyler Deis, OTR/L [facility] Acute Rehabilitation Office: [phone]   Myrla Halsted 12/28/2022, 1:18 PM

## 2022-12-29 DIAGNOSIS — I4892 Unspecified atrial flutter: Secondary | ICD-10-CM | POA: Diagnosis not present

## 2022-12-29 DIAGNOSIS — J9612 Chronic respiratory failure with hypercapnia: Secondary | ICD-10-CM | POA: Diagnosis not present

## 2022-12-29 DIAGNOSIS — J449 Chronic obstructive pulmonary disease, unspecified: Secondary | ICD-10-CM | POA: Diagnosis not present

## 2022-12-29 DIAGNOSIS — I5042 Chronic combined systolic (congestive) and diastolic (congestive) heart failure: Secondary | ICD-10-CM | POA: Diagnosis not present

## 2022-12-29 DIAGNOSIS — I110 Hypertensive heart disease with heart failure: Secondary | ICD-10-CM | POA: Diagnosis not present

## 2022-12-29 DIAGNOSIS — J9611 Chronic respiratory failure with hypoxia: Secondary | ICD-10-CM | POA: Diagnosis not present

## 2022-12-29 DIAGNOSIS — E1151 Type 2 diabetes mellitus with diabetic peripheral angiopathy without gangrene: Secondary | ICD-10-CM | POA: Diagnosis not present

## 2022-12-29 DIAGNOSIS — D509 Iron deficiency anemia, unspecified: Secondary | ICD-10-CM | POA: Diagnosis not present

## 2022-12-29 DIAGNOSIS — I4891 Unspecified atrial fibrillation: Secondary | ICD-10-CM | POA: Diagnosis not present

## 2022-12-31 DIAGNOSIS — J449 Chronic obstructive pulmonary disease, unspecified: Secondary | ICD-10-CM | POA: Diagnosis not present

## 2022-12-31 DIAGNOSIS — I110 Hypertensive heart disease with heart failure: Secondary | ICD-10-CM | POA: Diagnosis not present

## 2022-12-31 DIAGNOSIS — E1151 Type 2 diabetes mellitus with diabetic peripheral angiopathy without gangrene: Secondary | ICD-10-CM | POA: Diagnosis not present

## 2022-12-31 DIAGNOSIS — I4892 Unspecified atrial flutter: Secondary | ICD-10-CM | POA: Diagnosis not present

## 2022-12-31 DIAGNOSIS — I5042 Chronic combined systolic (congestive) and diastolic (congestive) heart failure: Secondary | ICD-10-CM | POA: Diagnosis not present

## 2022-12-31 DIAGNOSIS — D509 Iron deficiency anemia, unspecified: Secondary | ICD-10-CM | POA: Diagnosis not present

## 2022-12-31 DIAGNOSIS — J9612 Chronic respiratory failure with hypercapnia: Secondary | ICD-10-CM | POA: Diagnosis not present

## 2022-12-31 DIAGNOSIS — I4891 Unspecified atrial fibrillation: Secondary | ICD-10-CM | POA: Diagnosis not present

## 2022-12-31 DIAGNOSIS — J9611 Chronic respiratory failure with hypoxia: Secondary | ICD-10-CM | POA: Diagnosis not present

## 2023-01-01 ENCOUNTER — Telehealth: Payer: Self-pay

## 2023-01-01 NOTE — Transitions of Care (Post Inpatient/ED Visit) (Signed)
01/01/2023  Name: Jared [name] MRN: [id number] DOB: 1955-12-23  Today's TOC FU Call Status: Today's TOC FU Call Status:: Successful TOC FU Call Competed TOC FU Call Complete Date: 01/01/23  Transition Care Management Follow-up Telephone Call Date of Discharge: 12/28/22 Discharge Facility: [facility]) Type of Discharge: Inpatient Admission Primary Inpatient Discharge Diagnosis:: symptomatic anemia How have you been since you were released from the hospital?: Same Any questions or concerns?: Yes Patient Questions/Concerns:: He said he is feeling weak and has pain in his legs, the same as he felt when he went into the hospital . He stated he would like to know more about why he is so anemic.  He said he has never had any bleeding.  he would like to have his labs checked monthly to try to stay on top of the anemia Patient Questions/Concerns Addressed: Notified Provider of Patient Questions/Concerns  Items Reviewed: Did you receive and understand the discharge instructions provided?: Yes Medications obtained,verified, and reconciled?: Partial Review Completed Reason for Partial Mediation Review: He said he has all medications and did not have any questions about the med regime and did not need to review the med list. Any new allergies since your discharge?: No Dietary orders reviewed?: Yes Type of Diet Ordered:: heart healthy, low sodium Do you have support at home?: Yes People in Home: alone Name of Support/Comfort Primary Source: his sister does the grocery shopping and transports him to appointments  Medications Reviewed Today: Medications Reviewed Today     Reviewed by Napoleon Form, MD (Physician) on 12/27/22 at [id number]  Med List Status: Complete   Medication Order Taking? Sig Documenting Provider Last Dose Status Informant  Accu-Chek Softclix Lancets lancets [id number]  Use as instructed daily before meals [name], MD  Active Self, Pharmacy Records  acetaminophen  (TYLENOL) 500 MG tablet [id number] Yes Take 1,000 mg by mouth every 6 (six) hours as needed for moderate pain. [provider] Past Week Active Self, Pharmacy Records  albuterol (VENTOLIN HFA) 108 (90 Base) MCG/ACT inhaler [id number] Yes INHALE 2 PUFFS INTO THE LUNGS EVERY 6 (SIX) HOURS AS NEEDED FOR WHEEZING OR SHORTNESS OF BREATH.  Patient taking differently: Inhale 2 puffs into the lungs daily as needed for wheezing or shortness of breath.   [name], MD 12/26/2022 Active Self, Pharmacy Records  aspirin EC 81 MG tablet [id number] Yes Take 1 tablet (81 mg total) by mouth daily. Swallow whole. Leonie Douglas, MD 12/26/2022 Active Self, Pharmacy Records  atorvastatin (LIPITOR) 80 MG tablet [id number] Yes Take 1 tablet (80 mg total) by mouth daily. [name], MD 12/26/2022 Active Self, Pharmacy Records  Budeson-Glycopyrrol-Formoterol [facility] AEROSPHERE) 160-9-4.8 MCG/ACT AERO [id number] Yes Take 2 puffs first thing in morning and then another 2 puffs about 12 hours later.  Patient taking differently: Inhale 2 puffs into the lungs in the morning and at bedtime.   Nyoka Cowden, MD 12/26/2022 Active Self, Pharmacy Records  ergocalciferol (DRISDOL) 1.25 MG (50000 UT) capsule [id number] No Take 1 capsule (50,000 Units total) by mouth once a week.  Patient not taking: Reported on 12/04/2022   [name], MD Not Taking Active Self, Pharmacy Records  ezetimibe (ZETIA) 10 MG tablet [id number] No Take 1 tablet (10 mg total) by mouth daily.  Patient not taking: Reported on 12/26/2022   Pricilla Riffle, MD Not Taking Active Self, Pharmacy Records           Med Note Epimenio Sarin, Con [place]os Dec 26, 2022 12:48  PM) Pt was unable to verify if he is taking this medication even with assistance from pharmacy.   ferrous sulfate 325 (65 FE) MG tablet [id number] Yes Take 1 tablet (325 mg total) by mouth daily with breakfast.  Patient taking differently: Take 325 mg by mouth every evening.   Sherrilyn Rist, MD 12/25/2022 Active Self, Pharmacy Records  folic acid (FOLVITE) 1 MG tablet [id number] No Take 1 tablet (1 mg total) by mouth daily.  Patient not taking: Reported on 12/04/2022   [name], MD Not Taking Active Self, Pharmacy Records  furosemide (LASIX) 40 MG tablet [id number] Yes Take 1 tablet (40 mg total) by mouth 2 (two) times daily. [name], MD 12/26/2022 Active Self, Pharmacy Records  gabapentin (NEURONTIN) 300 MG capsule [id number] Yes Take 1 capsule (300 mg total) by mouth 2 (two) times daily. Kathryne Hitch, MD 12/26/2022 Active Self, Pharmacy Records  glucose blood (ACCU-CHEK GUIDE) test strip [id number]  Use as instructed daily [name], MD  Active Self, Pharmacy Records  hydrALAZINE (APRESOLINE) 50 MG tablet [id number] Yes Take 1 tablet (50 mg total) by mouth every 8 (eight) hours.  Patient taking differently: Take 50 mg by mouth daily.   [name], MD 12/26/2022 Active Self, Pharmacy Records           Med Note (CRUTHIS, CHLOE C   Wed Dec 26, 2022 12:51 PM) Pt is adamant he is taking 50 mg once daily instead of 50 mg TID as prescribed.   isosorbide mononitrate (IMDUR) 30 MG 24 hr tablet [id number] Yes Take 1 tablet (30 mg total) by mouth daily. [name], MD 12/26/2022 Active Self, Pharmacy Records  metoprolol succinate (TOPROL-XL) 50 MG 24 hr tablet [id number] Yes Take 1 tablet (50 mg total) by mouth daily. Take with or immediately following a meal. [name], MD 12/26/2022 Active Self, Pharmacy Records  nitroGLYCERIN (NITROSTAT) 0.4 MG SL tablet [id number] No Place 1 tablet (0.4 mg total) under the tongue every 5 (five) minutes as needed for chest pain.  Patient not taking: Reported on 12/04/2022   Albertine Grates, MD Not Taking Expired 03/21/22 2359 Self           Med Note Dennard Nip Mar 21, 2022  4:11 PM)    OXYGEN [id number] Yes Inhale 2 L/min into the lungs continuous. [provider] cont Active Self, Pharmacy Records   pantoprazole (PROTONIX) 40 MG tablet [id number] No Take 1 tablet (40 mg total) by mouth daily.  Patient not taking: Reported on 12/26/2022   [name], MD Not Taking Active Self, Pharmacy Records           Med Note (CRUTHIS, Con Memos Dec 26, 2022 12:48 PM) Pt was unable to verify if he is taking this medication even with assistance from pharmacy.   rivaroxaban (XARELTO) 20 MG TABS tablet [id number] Yes Take 1 tablet (20 mg total) by mouth daily with supper. [name], MD 12/25/2022 1800 Active Self, Pharmacy Records  terbinafine (LAMISIL AT ATHLETES FOOT) 1 % cream [id number] No Apply 1 Application topically 2 (two) times daily.  Patient not taking: Reported on 12/26/2022   [name], MD Not Taking Active Self, Pharmacy Records            Home Care and Equipment/Supplies: Were Home Health Services Ordered?: Yes Name of Home Health Agency:: [facility] Has Agency set up a time to come to your home?: Yes First Home Health Visit Date: 12/29/22  Any new equipment or medical supplies ordered?: No  Functional Questionnaire: Do you need assistance with bathing/showering or dressing?: No Do you need assistance with meal preparation?: Yes (His sister does the gocery shopping for him) Do you need assistance with eating?: No Do you have difficulty maintaining continence: No Do you need assistance with getting out of bed/getting out of a chair/moving?: Yes (ambulates with a walker. Uses O2 @ 2L continuously) Do you have difficulty managing or taking your medications?: No  Follow up appointments reviewed: PCP Follow-up appointment confirmed?: Yes Date of PCP follow-up appointment?: 01/09/23 Follow-up Provider: [facility] Follow-up appointment confirmed?: Yes Date of Specialist follow-up appointment?: 01/15/23 Follow-Up Specialty Provider:: [facility] Do you need transportation to your follow-up appointment?: No (his sister will drive him) Do you  understand care options if your condition(s) worsen?: Yes-patient verbalized understanding    SIGNATURE Robyne Peers, RN

## 2023-01-01 NOTE — Telephone Encounter (Signed)
From [facility] call:  He said he is feeling weak and has pain in his legs, the same as he felt when he went into the hospital . He stated he would like to know more about why he is so anemic. He would like to have his labs checked monthly to try to stay on top of the anemia.  He has a glucometer and has been checking his blood sugars daily but is not sure why he has to do it because he is not on any diabetic meds. He will discuss with Dr Alvis Lemmings at upcoming appt.   Follow up appointment with Dr Alvis Lemmings - 01/09/2023.

## 2023-01-03 ENCOUNTER — Other Ambulatory Visit: Payer: Self-pay

## 2023-01-03 ENCOUNTER — Other Ambulatory Visit: Payer: Self-pay | Admitting: Family Medicine

## 2023-01-03 DIAGNOSIS — I110 Hypertensive heart disease with heart failure: Secondary | ICD-10-CM | POA: Diagnosis not present

## 2023-01-03 DIAGNOSIS — E1151 Type 2 diabetes mellitus with diabetic peripheral angiopathy without gangrene: Secondary | ICD-10-CM | POA: Diagnosis not present

## 2023-01-03 DIAGNOSIS — I5042 Chronic combined systolic (congestive) and diastolic (congestive) heart failure: Secondary | ICD-10-CM | POA: Diagnosis not present

## 2023-01-03 DIAGNOSIS — D509 Iron deficiency anemia, unspecified: Secondary | ICD-10-CM | POA: Diagnosis not present

## 2023-01-03 DIAGNOSIS — J9612 Chronic respiratory failure with hypercapnia: Secondary | ICD-10-CM | POA: Diagnosis not present

## 2023-01-03 DIAGNOSIS — J9611 Chronic respiratory failure with hypoxia: Secondary | ICD-10-CM | POA: Diagnosis not present

## 2023-01-03 DIAGNOSIS — J449 Chronic obstructive pulmonary disease, unspecified: Secondary | ICD-10-CM | POA: Diagnosis not present

## 2023-01-03 DIAGNOSIS — I4892 Unspecified atrial flutter: Secondary | ICD-10-CM | POA: Diagnosis not present

## 2023-01-03 DIAGNOSIS — I4891 Unspecified atrial fibrillation: Secondary | ICD-10-CM | POA: Diagnosis not present

## 2023-01-03 MED ORDER — FOLIC ACID 1 MG PO TABS
1.0000 mg | ORAL_TABLET | Freq: Every day | ORAL | 0 refills | Status: DC
  Filled 2023-01-03: qty 90, 90d supply, fill #0

## 2023-01-03 MED ORDER — ACETAMINOPHEN 500 MG PO TABS
1000.0000 mg | ORAL_TABLET | Freq: Three times a day (TID) | ORAL | 1 refills | Status: DC | PRN
  Filled 2023-01-03: qty 100, 17d supply, fill #0
  Filled 2023-02-24: qty 100, 17d supply, fill #1

## 2023-01-03 MED ORDER — HYDRALAZINE HCL 50 MG PO TABS
50.0000 mg | ORAL_TABLET | Freq: Three times a day (TID) | ORAL | 0 refills | Status: DC
Start: 2023-01-03 — End: 2023-01-30
  Filled 2023-01-03: qty 90, 30d supply, fill #0

## 2023-01-03 NOTE — Telephone Encounter (Signed)
Requested Prescriptions  Pending Prescriptions Disp Refills   hydrALAZINE (APRESOLINE) 50 MG tablet 90 tablet 0    Sig: Take 1 tablet (50 mg total) by mouth every 8 (eight) hours.     Cardiovascular:  Vasodilators Failed - 01/03/2023 11:18 AM      Failed - HCT in normal range and within 360 days    HCT  Date Value Ref Range Status  12/28/2022 28.2 (L) 39.0 - 52.0 % Final   Hematocrit  Date Value Ref Range Status  10/25/2022 38.2 37.5 - 51.0 % Final         Failed - HGB in normal range and within 360 days    Hemoglobin  Date Value Ref Range Status  12/28/2022 7.9 (L) 13.0 - 17.0 g/dL Final  41/66/0630 16.0 (L) 13.0 - 17.7 g/dL Final         Failed - RBC in normal range and within 360 days    RBC  Date Value Ref Range Status  12/28/2022 3.36 (L) 4.22 - 5.81 MIL/uL Final         Failed - WBC in normal range and within 360 days    WBC  Date Value Ref Range Status  12/28/2022 12.7 (H) 4.0 - 10.5 K/uL Final         Failed - ANA Screen, Ifa, Serum in normal range and within 360 days    No results found for: "ANA", "ANATITER", "LABANTI"       Passed - PLT in normal range and within 360 days    Platelets  Date Value Ref Range Status  12/28/2022 391 150 - 400 K/uL Final  10/25/2022 323 150 - 450 x10E3/uL Final         Passed - Last BP in normal range    BP Readings from Last 1 Encounters:  12/28/22 126/67         Passed - Valid encounter within last 12 months    Recent Outpatient Visits           2 months ago Type 2 diabetes mellitus with other specified complication, without long-term current use of insulin (HCC)   [place] [facility] [name], MD   5 months ago Bilateral impacted cerumen   [place] [facility] [name], MD   8 months ago COPD GOLD III/still smoking    [place] [facility] [place], Odette Horns, MD   1 year ago Type 2 diabetes mellitus with other specified  complication, without long-term current use of insulin (HCC)   [place] [facility] [name], MD   1 year ago Chronic pain of both knees   [place] [facility] [place], Marzella Schlein, [place]       Future Appointments             In 6 days [name], MD [facility]   In 1 month [name], MD [facility]   In 2 months Pricilla Riffle, MD [facility]             folic acid (FOLVITE) 1 MG tablet 90 tablet 0    Sig: Take 1 tablet (1 mg total) by mouth daily.     Endocrinology:  Vitamins Passed - 01/03/2023 11:18 AM      Passed - Valid encounter within last 12 months  Recent Outpatient Visits           2 months ago Type 2 diabetes mellitus with other specified complication, without long-term current use of insulin (HCC)   [place] [facility] [name], MD   5 months ago Bilateral impacted cerumen   [place] [facility] [name], MD   8 months ago COPD GOLD III/still smoking    [place] [facility] [place], Odette Horns, MD   1 year ago Type 2 diabetes mellitus with other specified complication, without long-term current use of insulin [facility])   [place] [facility] [name], MD   1 year ago Chronic pain of both knees   [place] [facility] [place], Marzella Schlein, [place]       Future Appointments             In 6 days [name], MD [facility]   In 1 month [name], MD [facility]   In 2 months Tenny Craw, Sherol Dade, MD [facility]

## 2023-01-04 DIAGNOSIS — I4891 Unspecified atrial fibrillation: Secondary | ICD-10-CM | POA: Diagnosis not present

## 2023-01-04 DIAGNOSIS — I4892 Unspecified atrial flutter: Secondary | ICD-10-CM | POA: Diagnosis not present

## 2023-01-04 DIAGNOSIS — I5042 Chronic combined systolic (congestive) and diastolic (congestive) heart failure: Secondary | ICD-10-CM | POA: Diagnosis not present

## 2023-01-04 DIAGNOSIS — E1151 Type 2 diabetes mellitus with diabetic peripheral angiopathy without gangrene: Secondary | ICD-10-CM | POA: Diagnosis not present

## 2023-01-04 DIAGNOSIS — I110 Hypertensive heart disease with heart failure: Secondary | ICD-10-CM | POA: Diagnosis not present

## 2023-01-04 DIAGNOSIS — J9612 Chronic respiratory failure with hypercapnia: Secondary | ICD-10-CM | POA: Diagnosis not present

## 2023-01-04 DIAGNOSIS — J9611 Chronic respiratory failure with hypoxia: Secondary | ICD-10-CM | POA: Diagnosis not present

## 2023-01-04 DIAGNOSIS — J449 Chronic obstructive pulmonary disease, unspecified: Secondary | ICD-10-CM | POA: Diagnosis not present

## 2023-01-04 DIAGNOSIS — D509 Iron deficiency anemia, unspecified: Secondary | ICD-10-CM | POA: Diagnosis not present

## 2023-01-07 ENCOUNTER — Other Ambulatory Visit: Payer: Self-pay

## 2023-01-08 ENCOUNTER — Telehealth: Payer: Self-pay | Admitting: Family Medicine

## 2023-01-08 NOTE — Telephone Encounter (Signed)
Copied from CRM [id number]. Topic: General - Inquiry >> Jan 07, 2023  4:09 PM Lennox Pippins wrote: [name], therapist with [facility], stated that [facility] had an appointment with patient tomorrow,  01/08/2023, for OT, but patient requested to hold his services until next week due to his roommate having covid   [name]'s # [phone]

## 2023-01-09 ENCOUNTER — Other Ambulatory Visit: Payer: Self-pay | Admitting: Family Medicine

## 2023-01-09 ENCOUNTER — Other Ambulatory Visit: Payer: Self-pay

## 2023-01-09 ENCOUNTER — Other Ambulatory Visit: Payer: Self-pay | Admitting: Pharmacist

## 2023-01-09 ENCOUNTER — Encounter: Payer: Self-pay | Admitting: Family Medicine

## 2023-01-09 ENCOUNTER — Ambulatory Visit: Payer: Medicare HMO | Attending: Family Medicine | Admitting: Family Medicine

## 2023-01-09 VITALS — BP 115/64 | HR 90 | Ht 75.0 in | Wt 222.4 lb

## 2023-01-09 DIAGNOSIS — M79672 Pain in left foot: Secondary | ICD-10-CM

## 2023-01-09 DIAGNOSIS — I5042 Chronic combined systolic (congestive) and diastolic (congestive) heart failure: Secondary | ICD-10-CM | POA: Diagnosis not present

## 2023-01-09 DIAGNOSIS — I4892 Unspecified atrial flutter: Secondary | ICD-10-CM | POA: Diagnosis not present

## 2023-01-09 DIAGNOSIS — M79671 Pain in right foot: Secondary | ICD-10-CM | POA: Diagnosis not present

## 2023-01-09 DIAGNOSIS — G629 Polyneuropathy, unspecified: Secondary | ICD-10-CM | POA: Diagnosis not present

## 2023-01-09 DIAGNOSIS — F17210 Nicotine dependence, cigarettes, uncomplicated: Secondary | ICD-10-CM | POA: Diagnosis not present

## 2023-01-09 DIAGNOSIS — I739 Peripheral vascular disease, unspecified: Secondary | ICD-10-CM

## 2023-01-09 DIAGNOSIS — D500 Iron deficiency anemia secondary to blood loss (chronic): Secondary | ICD-10-CM

## 2023-01-09 DIAGNOSIS — E1169 Type 2 diabetes mellitus with other specified complication: Secondary | ICD-10-CM | POA: Diagnosis not present

## 2023-01-09 DIAGNOSIS — I110 Hypertensive heart disease with heart failure: Secondary | ICD-10-CM

## 2023-01-09 MED ORDER — ACCU-CHEK GUIDE VI STRP
ORAL_STRIP | 6 refills | Status: DC
  Filled 2023-01-09: qty 50, 50d supply, fill #0
  Filled 2023-02-24: qty 50, 50d supply, fill #1
  Filled 2023-04-09: qty 100, 50d supply, fill #2

## 2023-01-09 MED ORDER — ACCU-CHEK SOFTCLIX LANCETS MISC
6 refills | Status: DC
  Filled 2023-01-09: qty 100, 90d supply, fill #0
  Filled 2023-02-24 – 2023-04-09 (×2): qty 100, 90d supply, fill #1
  Filled 2023-07-12: qty 100, 90d supply, fill #2

## 2023-01-09 MED ORDER — DULOXETINE HCL 60 MG PO CPEP
60.0000 mg | ORAL_CAPSULE | Freq: Every day | ORAL | 3 refills | Status: DC
  Filled 2023-01-09: qty 30, 30d supply, fill #0
  Filled 2023-02-04: qty 30, 30d supply, fill #1
  Filled 2023-02-24 – 2023-03-11 (×2): qty 30, 30d supply, fill #2
  Filled 2023-04-09: qty 30, 30d supply, fill #3

## 2023-01-09 MED ORDER — FUROSEMIDE 40 MG PO TABS
40.0000 mg | ORAL_TABLET | Freq: Two times a day (BID) | ORAL | 1 refills | Status: DC | PRN
Start: 2023-01-09 — End: 2023-05-21
  Filled 2023-01-09: qty 270, 54d supply, fill #0
  Filled 2023-02-24: qty 270, 54d supply, fill #1

## 2023-01-09 MED ORDER — ACCU-CHEK GUIDE W/DEVICE KIT
PACK | 0 refills | Status: DC
  Filled 2023-01-09: qty 1, 1d supply, fill #0

## 2023-01-09 NOTE — Progress Notes (Signed)
Subjective:  Patient ID: Jared [name], male    DOB: [birth year]  Age: 67 y.o. MRN: [id number]  CC: Hospitalization Follow-up (Pain in legs medication not working/Fatigue)   HPI Jared [name] is a 67 y.o. year old male with a history of hypertension, COPD, tobacco abuse, NICM , CHF (EF 45-50% from echo 12/2021), atrial flutter, chronic respiratory failure with hypoxia (currently on 2-3 L of oxygen), multiple hospitalizations for GI bleed with secondary anemia due to AV malformations, peptic ulcer disease, previous history of CVA, Type 2 DM, PAD status post left femoral-popliteal angioplasty and stenting (in 11/2022).  Presents for transition of care visit after hospitalization for symptomatic anemia when he presented with fatigue and dyspnea on a hemoglobin of 5.9 on presentation requiring 3 units PRBC and IV ferric gluconate.  Upper endoscopy revealed normal findings. Xarelto was initially held and later resumed.  Outpatient appointment with cardiology for LAA occlusion was scheduled. Discharge hemoglobin was 8.3.  Interval History: Discussed the use of AI scribe software for clinical note transcription with the patient, who gave verbal consent to proceed.  He presents with ongoing concerns of recurrent anemia and persistent leg pain. Despite multiple hospital admissions, the cause of the anemia remains elusive, leading to patient frustration. He denies any visible blood loss in stool, urine, or vomit. The patient also reports a gradual onset of weakness, which often leads to hospital admission.  In addition to the anemia, the patient is experiencing persistent leg pain, particularly in the left leg. Despite the placement of stents by a vascular specialist, the pain persists, albeit less severe. The patient also reports constant foot pain and swelling, which he suspects may be contributing to the leg pain.  He has pain in the soles of both feet. Endorses adherence with his Xarelto.    The patient  lives in a boarding house and has people who check on him regularly. He continues to smoke despite being advised of the potential for stent blockage.     His appointment with cardiology is not until 03/2023.   Past Medical History:  Diagnosis Date   AVM (arteriovenous malformation)    CAD (coronary artery disease)    a. LHC 5/12:  LAD 20, pLCx 20, pRCA 40, dRCA 40, EF 35%, diff HK  //  b. Myoview 4/16: Overall Impression:  High risk stress nuclear study There is no evidence of ischemia.  There is severe LV dysfunction. LV Ejection Fraction: 30%.  LV Wall Motion:  There is global LV hypokinesis.     CAP (community acquired pneumonia) 09/2013   Chronic combined systolic and diastolic CHF (congestive heart failure) (HCC)    a. Echo 4/16:Mild LVH, EF 40-45%, diffuse HK //  b. Echo 8/17: EF 35-40%, diffuse HK, diastolic dysfunction, aortic sclerosis, trivial MR, moderate LAE, normal RVSF, moderate RAE, mild TR, PASP 42 mmHg // c. Echo 4/18: Mild concentric LVH, EF 30-35, normal wall motion, grade 1 diastolic dysfunction, PASP 49   Chronic respiratory failure (HCC)    Cluster headache    "hx; haven't had one in awhile" (01/09/2016)   COPD (chronic obstructive pulmonary disease) (HCC)    [name] 01/09/2016   DM (diabetes mellitus) (HCC)    History of CVA (cerebrovascular accident)    Hypertension    IDA (iron deficiency anemia)    Moderate tobacco use disorder    NICM (nonischemic cardiomyopathy) (HCC)    Nicotine addiction    Tobacco abuse    Type 2 diabetes mellitus (HCC) 05/14/2016  Past Surgical History:  Procedure Laterality Date   ABDOMINAL AORTOGRAM W/LOWER EXTREMITY N/A 12/07/2022   Procedure: ABDOMINAL AORTOGRAM W/LOWER EXTREMITY;  Surgeon: Leonie Douglas, MD;  [facility]: [facility];  Service: Cardiovascular;  Laterality: N/A;   BIOPSY  11/12/2020   Procedure: BIOPSY;  Surgeon: Lemar Lofty., MD;  Location: [facility] ENDOSCOPY;  Service: Gastroenterology;;   CARDIAC  CATHETERIZATION  10/2010   LM normal, LAD with 20% irregularities, LCX with 20%, RCA with 40% prox and 40% distal - EF of 35%   CATARACT EXTRACTION, BILATERAL     COLONOSCOPY W/ BIOPSIES AND POLYPECTOMY     COLONOSCOPY WITH PROPOFOL N/A 09/06/2018   Procedure: COLONOSCOPY WITH PROPOFOL;  Surgeon: Tressia Danas, MD;  Location: [facility] ENDOSCOPY;  Service: Gastroenterology;  Laterality: N/A;   ENTEROSCOPY N/A 09/28/2018   Procedure: ENTEROSCOPY;  Surgeon: Jeani Hawking, MD;  Location: [facility] ENDOSCOPY;  Service: Endoscopy;  Laterality: N/A;   ENTEROSCOPY N/A 10/28/2018   Procedure: ENTEROSCOPY;  Surgeon: Tressia Danas, MD;  Location: [facility] ENDOSCOPY;  Service: Gastroenterology;  Laterality: N/A;   ENTEROSCOPY N/A 10/09/2020   Procedure: ENTEROSCOPY;  Surgeon: Hilarie Fredrickson, MD;  Location: [facility] ENDOSCOPY;  Service: Endoscopy;  Laterality: N/A;   ENTEROSCOPY N/A 03/22/2022   Procedure: ENTEROSCOPY;  Surgeon: Benancio Deeds, MD;  Location: [facility] ENDOSCOPY;  Service: Gastroenterology;  Laterality: N/A;   ESOPHAGOGASTRODUODENOSCOPY N/A 11/12/2020   Procedure: ESOPHAGOGASTRODUODENOSCOPY (EGD);  Surgeon: Lemar Lofty., MD;  Location: [facility] ENDOSCOPY;  Service: Gastroenterology;  Laterality: N/A;   ESOPHAGOGASTRODUODENOSCOPY (EGD) WITH PROPOFOL N/A 09/05/2018   Procedure: ESOPHAGOGASTRODUODENOSCOPY (EGD) WITH PROPOFOL;  Surgeon: Benancio Deeds, MD;  Location: [facility] ENDOSCOPY;  Service: Gastroenterology;  Laterality: N/A;   ESOPHAGOGASTRODUODENOSCOPY (EGD) WITH PROPOFOL N/A 11/19/2020   Procedure: ESOPHAGOGASTRODUODENOSCOPY (EGD) WITH PROPOFOL;  Surgeon: Beverley Fiedler, MD;  Location: [facility] ENDOSCOPY;  Service: Gastroenterology;  Laterality: N/A;   ESOPHAGOGASTRODUODENOSCOPY (EGD) WITH PROPOFOL N/A 12/27/2022   Procedure: ESOPHAGOGASTRODUODENOSCOPY (EGD) WITH PROPOFOL;  Surgeon: Napoleon Form, MD;  Location: [facility] ENDOSCOPY;  Service: Gastroenterology;  Laterality: N/A;   EXCISION MASS HEAD     GIVENS CAPSULE  STUDY N/A 09/06/2018   Procedure: GIVENS CAPSULE STUDY;  Surgeon: Tressia Danas, MD;  Location: [facility] ENDOSCOPY;  Service: Gastroenterology;  Laterality: N/A;   GIVENS CAPSULE STUDY N/A 09/26/2018   Procedure: GIVENS CAPSULE STUDY;  Surgeon: Beverley Fiedler, MD;  Location: [facility] ENDOSCOPY;  Service: Gastroenterology;  Laterality: N/A;   GIVENS CAPSULE STUDY N/A 06/14/2019   Procedure: GIVENS CAPSULE STUDY;  Surgeon: Napoleon Form, MD;  Location: [facility] ENDOSCOPY;  Service: Endoscopy;  Laterality: N/A;   HEMOSTASIS CLIP PLACEMENT  11/12/2020   Procedure: HEMOSTASIS CLIP PLACEMENT;  Surgeon: Lemar Lofty., MD;  Location: [facility] ENDOSCOPY;  Service: Gastroenterology;;   HEMOSTASIS CONTROL  11/12/2020   Procedure: HEMOSTASIS CONTROL;  Surgeon: Lemar Lofty., MD;  Location: [facility] ENDOSCOPY;  Service: Gastroenterology;;   HOT HEMOSTASIS N/A 10/28/2018   Procedure: HOT HEMOSTASIS (ARGON PLASMA COAGULATION/BICAP);  Surgeon: Tressia Danas, MD;  Location: [facility] ENDOSCOPY;  Service: Gastroenterology;  Laterality: N/A;   HOT HEMOSTASIS N/A 11/12/2020   Procedure: HOT HEMOSTASIS (ARGON PLASMA COAGULATION/BICAP);  Surgeon: Lemar Lofty., MD;  Location: [facility] ENDOSCOPY;  Service: Gastroenterology;  Laterality: N/A;   HOT HEMOSTASIS N/A 03/22/2022   Procedure: HOT HEMOSTASIS (ARGON PLASMA COAGULATION/BICAP);  Surgeon: Benancio Deeds, MD;  Location: [facility] ENDOSCOPY;  Service: Gastroenterology;  Laterality: N/A;   INCISION AND DRAINAGE PERIRECTAL ABSCESS N/A 06/05/2017   Procedure: IRRIGATION AND DEBRIDEMENT  PERIRECTAL ABSCESS;  Surgeon: Andria Meuse, MD;  Location: [facility] OR;  Service: General;  Laterality: N/A;   PERIPHERAL VASCULAR INTERVENTION  12/07/2022   Procedure: PERIPHERAL VASCULAR INTERVENTION;  Surgeon: Leonie Douglas, MD;  Location: [facility];  Service: Cardiovascular;;   SUBMUCOSAL TATTOO INJECTION  11/12/2020   Procedure: SUBMUCOSAL TATTOO INJECTION;  Surgeon: Lemar Lofty., MD;  Location: [facility] ENDOSCOPY;  Service: Gastroenterology;;   VIDEO BRONCHOSCOPY Bilateral 05/08/2016   Procedure: VIDEO BRONCHOSCOPY WITH FLUORO;  Surgeon: Oretha Milch, MD;  Location: [facility] ENDOSCOPY;  Service: Cardiopulmonary;  Laterality: Bilateral;    Family History  Problem Relation Age of Onset   Heart disease Mother    Diabetes Mother    Colon cancer Mother    Liver cancer Mother    Cancer Father        type unknown   Diabetes Sister        x 2   Diabetes Brother     Social History   Socioeconomic History   Marital status: Single    Spouse name: Not on file   Number of children: 1   Years of education: Not on file   Highest education level: Not on file  Occupational History   Occupation: retired  Tobacco Use   Smoking status: Every Day    Current packs/day: 0.50    Average packs/day: 0.5 packs/day for 47.0 years (23.5 ttl pk-yrs)    Types: Cigarettes   Smokeless tobacco: Never   Tobacco comments:    4/5 cigs  Vaping Use   Vaping status: Never Used  Substance and Sexual Activity   Alcohol use: Not Currently    Alcohol/week: 0.0 standard drinks of alcohol    Comment: last drink was before xmas   Drug use: No    Types: Cocaine, Marijuana    Comment: "nothing in 20 years"   Sexual activity: Not Currently  Other Topics Concern   Not on file  Social History Narrative   unemployed   Social Determinants of Health   Financial Resource Strain: Low Risk  (05/25/2022)   Overall Financial Resource Strain (CARDIA)    Difficulty of Paying Living Expenses: Not hard at all  Food Insecurity: No Food Insecurity (12/26/2022)   Hunger Vital Sign    Worried About Running Out of Food in the Last Year: Never true    Ran Out of Food in the Last Year: Never true  Transportation Needs: No Transportation Needs (12/26/2022)   PRAPARE - Administrator, Civil Service (Medical): No    Lack of Transportation (Non-Medical): No  Physical Activity: Inactive  (05/25/2022)   Exercise Vital Sign    Days of Exercise per Week: 0 days    Minutes of Exercise per Session: 0 min  Stress: No Stress Concern Present (05/25/2022)   [employer] of Occupational Health - Occupational Stress Questionnaire    Feeling of Stress : Not at all  Social Connections: Unknown (05/25/2022)   Social Connection and Isolation Panel [NHANES]    Frequency of Communication with Friends and Family: Three times a week    Frequency of Social Gatherings with Friends and Family: Never    Attends Religious Services: Not on Marketing executive or Organizations: No    Attends Banker Meetings: Never    Marital Status: Divorced    Allergies  Allergen Reactions   Lisinopril Cough    Outpatient Medications Prior to Visit  Medication Sig  Dispense Refill   acetaminophen (TYLENOL) 500 MG tablet Take 2 tablets (1,000 mg total) by mouth every 8 (eight) hours as needed for moderate pain. 100 tablet 1   albuterol (VENTOLIN HFA) 108 (90 Base) MCG/ACT inhaler INHALE 2 PUFFS INTO THE LUNGS EVERY 6 (SIX) HOURS AS NEEDED FOR WHEEZING OR SHORTNESS OF BREATH. (Patient taking differently: Inhale 2 puffs into the lungs daily as needed for wheezing or shortness of breath.) 54 g 0   aspirin EC 81 MG tablet Take 1 tablet (81 mg total) by mouth daily. Swallow whole. 150 tablet 2   atorvastatin (LIPITOR) 80 MG tablet Take 1 tablet (80 mg total) by mouth daily. 90 tablet 1   Budeson-Glycopyrrol-Formoterol (BREZTRI AEROSPHERE) 160-9-4.8 MCG/ACT AERO Take 2 puffs first thing in morning and then another 2 puffs about 12 hours later. (Patient taking differently: Inhale 2 puffs into the lungs in the morning and at bedtime.) 10.7 g 6   ergocalciferol (DRISDOL) 1.25 MG (50000 UT) capsule Take 1 capsule (50,000 Units total) by mouth once a week. 12 capsule 1   ezetimibe (ZETIA) 10 MG tablet Take 1 tablet (10 mg total) by mouth daily. 90 tablet 3   ferrous sulfate 325 (65 FE) MG  tablet Take 1 tablet (325 mg total) by mouth daily with breakfast. (Patient taking differently: Take 325 mg by mouth every evening.) 60 tablet 1   folic acid (FOLVITE) 1 MG tablet Take 1 tablet (1 mg total) by mouth daily. 90 tablet 0   gabapentin (NEURONTIN) 300 MG capsule Take 1 capsule (300 mg total) by mouth 2 (two) times daily. 90 capsule 1   hydrALAZINE (APRESOLINE) 50 MG tablet Take 1 tablet (50 mg total) by mouth every 8 (eight) hours. 90 tablet 0   isosorbide mononitrate (IMDUR) 30 MG 24 hr tablet Take 1 tablet (30 mg total) by mouth daily. 90 tablet 1   metoprolol succinate (TOPROL-XL) 50 MG 24 hr tablet Take 1 tablet (50 mg total) by mouth daily. Take with or immediately following a meal. 90 tablet 1   OXYGEN Inhale 2 L/min into the lungs continuous.     pantoprazole (PROTONIX) 40 MG tablet Take 1 tablet (40 mg total) by mouth daily. 90 tablet 1   rivaroxaban (XARELTO) 20 MG TABS tablet Take 1 tablet (20 mg total) by mouth daily with supper. 90 tablet 1   Accu-Chek Softclix Lancets lancets Use as instructed daily before meals 100 each 12   furosemide (LASIX) 40 MG tablet Take 1 tablet (40 mg total) by mouth 2 (two) times daily. 180 tablet 1   glucose blood (ACCU-CHEK GUIDE) test strip Use as instructed daily 100 each 12   nitroGLYCERIN (NITROSTAT) 0.4 MG SL tablet Place 1 tablet (0.4 mg total) under the tongue every 5 (five) minutes as needed for chest pain. (Patient not taking: Reported on 12/04/2022)     No facility-administered medications prior to visit.     ROS Review of Systems  Constitutional:  Negative for activity change and appetite change.  HENT:  Negative for sinus pressure and sore throat.   Respiratory:  Negative for chest tightness, shortness of breath and wheezing.   Cardiovascular:  Positive for leg swelling. Negative for chest pain and palpitations.  Gastrointestinal:  Negative for abdominal distention, abdominal pain and constipation.  Genitourinary: Negative.    Musculoskeletal: Negative.   Psychiatric/Behavioral:  Negative for behavioral problems and dysphoric mood.     Objective:  BP 115/64   Pulse 90   Ht 6\' 3"  (  1.905 m)   Wt 222 lb 6.4 oz (100.9 kg)   SpO2 97%   BMI 27.80 kg/m      01/09/2023    9:29 AM 12/28/2022    7:19 AM 12/28/2022    4:44 AM  BP/Weight  Systolic BP 115 -- 126  Diastolic BP 64 -- 67  Wt. (Lbs) 222.4    BMI 27.8 kg/m2        Physical Exam Constitutional:      Appearance: He is well-developed.  HENT:     Nose:     Comments: On 3 L oxygen via nasal cannula Cardiovascular:     Rate and Rhythm: Normal rate.     Heart sounds: Normal heart sounds. No murmur heard. Pulmonary:     Effort: Pulmonary effort is normal.     Breath sounds: Normal breath sounds. No wheezing or rales.  Chest:     Chest wall: No tenderness.  Abdominal:     General: Bowel sounds are normal. There is no distension.     Palpations: Abdomen is soft. There is no mass.     Tenderness: There is no abdominal tenderness.  Musculoskeletal:        General: Normal range of motion.     Right lower leg: Edema present.     Left lower leg: Edema (2+) present.  Neurological:     Mental Status: He is alert and oriented to person, place, and time.  Psychiatric:        Mood and Affect: Mood normal.        Latest Ref Rng & Units 12/28/2022    3:09 AM 12/27/2022    9:16 AM 12/26/2022   11:45 PM  CMP  Glucose 70 - 99 mg/dL 425  956  387   BUN 8 - 23 mg/dL 15  16  15    Creatinine 0.61 - 1.24 mg/dL 5.64  3.32  9.51   Sodium 135 - 145 mmol/L 138  139  136   Potassium 3.5 - 5.1 mmol/L 3.7  4.1  4.3   Chloride 98 - 111 mmol/L 98  100  97   CO2 22 - 32 mmol/L 31   29   Calcium 8.9 - 10.3 mg/dL 8.7   8.8     Lipid Panel     Component Value Date/Time   CHOL 127 10/25/2022 1027   TRIG 80 10/25/2022 1027   HDL 49 10/25/2022 1027   CHOLHDL 2.9 01/29/2020 1020   CHOLHDL 2.8 01/11/2016 0446   VLDL 14 01/11/2016 0446   LDLCALC 62 10/25/2022  1027    CBC    Component Value Date/Time   WBC 12.7 (H) 12/28/2022 0309   RBC 3.36 (L) 12/28/2022 0309   HGB 7.9 (L) 12/28/2022 0309   HGB 11.5 (L) 10/25/2022 1027   HCT 28.2 (L) 12/28/2022 0309   HCT 38.2 10/25/2022 1027   PLT 391 12/28/2022 0309   PLT 323 10/25/2022 1027   MCV 83.9 12/28/2022 0309   MCV 90 10/25/2022 1027   MCH 23.5 (L) 12/28/2022 0309   MCHC 28.0 (L) 12/28/2022 0309   RDW 19.4 (H) 12/28/2022 0309   RDW 14.1 10/25/2022 1027   LYMPHSABS 1.1 12/26/2022 0939   LYMPHSABS 1.7 10/25/2022 1027   MONOABS 0.7 12/26/2022 0939   EOSABS 0.1 12/26/2022 0939   EOSABS 0.1 10/25/2022 1027   BASOSABS 0.1 12/26/2022 0939   BASOSABS 0.1 10/25/2022 1027    Lab Results  Component Value Date   HGBA1C 5.0 10/25/2022  Assessment & Plan:      Recurrent Anemia: Patient reports recurrent episodes of anemia with no visible blood loss. Previous GI investigations have not identified a source of bleeding. Patient expresses frustration with repeated hospitalizations and lack of definitive diagnosis. -Previous history of AVM, peptic ulcer -Order CBC today to assess current hemoglobin level. -Urgent referral to Hematology for further investigation. -He is of the opinion that probably his being stationed at [employer] might be contributing to his recurrent anemia -Continue iron supplements; he may need IV iron infusion  Peripheral Vascular Disease: Patient reports persistent pain in the left leg despite recent intervention. Pain is worse when the knee is straightened. -Continue current pain management with Gabapentin. -Add Cymbalta for additional pain control. -Advise patient to continue follow-up with Vascular Surgery.  Pedal Edema: Patient reports intermittent swelling in both feet, more persistent in the left foot. No associated pain. -Order Uric Acid test to assess for gout. -Increase Furosemide to with an extra tablet as needed for worsening pedal edema. -He is requesting  podiatry referral for foot pain -Referral to Podiatry for further evaluation.  Atrial Fibrillation: Currently in sinus rhythm. -Patient is on Xarelto for anticoagulation. -Continue Xarelto as prescribed.  Smoking: Patient continues to smoke despite known peripheral vascular disease. -Advise patient on the importance of smoking cessation, particularly in relation to vascular health.          Meds ordered this encounter  Medications   DULoxetine (CYMBALTA) 60 MG capsule    Sig: Take 1 capsule (60 mg total) by mouth daily. For chronic leg pains    Dispense:  30 capsule    Refill:  3   furosemide (LASIX) 40 MG tablet    Sig: Take 1-2 tablets (40-80 mg total) by mouth 2 (two) times daily as needed.Take an extra pill with worsening pedal edema.    Dispense:  270 tablet    Refill:  1    Follow-up: Return for previously scheduled appointment.       [name], MD, FAAFP. [facility] [place], [place] [phone]   01/09/2023, 12:55 PM

## 2023-01-09 NOTE — Patient Instructions (Signed)
Neuropathic Pain Neuropathic pain is pain caused by damage to the nerves that are responsible for certain sensations in your body (sensory nerves). Neuropathic pain can make you more sensitive to pain. Even a minor sensation can feel very painful. This is usually a long-term (chronic) condition that can be difficult to treat. The type of pain differs from person to person. It may: Start suddenly (acute), or it may develop slowly and become chronic. Come and go as damaged nerves heal, or it may stay at the same level for years. Cause emotional distress, loss of sleep, and a lower quality of life. What are the causes? The most common cause of this condition is diabetes. Many other diseases and conditions can also cause neuropathic pain. Causes of neuropathic pain can be classified as: Toxic. This is caused by medicines and chemicals. The most common causes of toxic neuropathic pain is damage from medicines that kill cancer cells (chemotherapy) or alcohol abuse. Metabolic. This can be caused by: Diabetes. Lack of vitamins like B12. Traumatic. Any injury that cuts, crushes, or stretches a nerve can cause damage and pain. Compression-related. If a sensory nerve gets trapped or compressed for a long period of time, the blood supply to the nerve can be cut off. Vascular. Many blood vessel diseases can cause neuropathic pain by decreasing blood supply and oxygen to nerves. Autoimmune. This type of pain results from diseases in which the body's defense system (immune system) mistakenly attacks sensory nerves. Examples of autoimmune diseases that can cause neuropathic pain include lupus and multiple sclerosis. Infectious. Many types of viral infections can damage sensory nerves and cause pain. Shingles infection is a common cause of this type of pain. Inherited. Neuropathic pain can be a symptom of many diseases that are passed down through families (genetic). What increases the risk? You are more likely to  develop this condition if: You have diabetes. You smoke. You drink too much alcohol. You are taking certain medicines, including chemotherapy or medicines that treat immune system disorders. What are the signs or symptoms? The main symptom is pain. Neuropathic pain is often described as: Burning. Shock-like. Stinging. Hot or cold. Itching. How is this diagnosed? No single test can diagnose neuropathic pain. It is diagnosed based on: A physical exam and your symptoms. Your health care provider will ask you about your pain. You may be asked to use a pain scale to describe how bad your pain is. Tests. These may be done to see if you have a cause and location of any nerve damage. They include: Nerve conduction studies and electromyography to test how well nerve signals travel through your nerves and muscles (electrodiagnostic testing). Skin biopsy to evaluate for small fiber neuropathy. Imaging studies, such as: X-rays. CT scan. MRI. How is this treated? Treatment for neuropathic pain may change over time. You may need to try different treatment options or a combination of treatments. Some options include: Treating the underlying cause of the neuropathy, such as diabetes, kidney disease, or vitamin deficiencies. Stopping medicines that can cause neuropathy, such as chemotherapy. Medicine to relieve pain. Medicines may include: Prescription or over-the-counter pain medicine. Anti-seizure medicine. Antidepressant medicines. Pain-relieving patches or creams that are applied to painful areas of skin. A medicine to numb the area (local anesthetic), which can be injected as a nerve block. Transcutaneous nerve stimulation. This uses electrical currents to block painful nerve signals. The treatment is painless. Alternative treatments, such as: Acupuncture. Meditation. Massage. Occupational or physical therapy. Pain management programs. Counseling. Follow   these instructions at  home: Medicines  Take over-the-counter and prescription medicines only as told by your health care provider. Ask your health care provider if the medicine prescribed to you: Requires you to avoid driving or using machinery. Can cause constipation. You may need to take these actions to prevent or treat constipation: Drink enough fluid to keep your urine pale yellow. Take over-the-counter or prescription medicines. Eat foods that are high in fiber, such as beans, whole grains, and fresh fruits and vegetables. Limit foods that are high in fat and processed sugars, such as fried or sweet foods. Lifestyle  Have a good support system at home. Consider joining a chronic pain support group. Do not use any products that contain nicotine or tobacco. These products include cigarettes, chewing tobacco, and vaping devices, such as e-cigarettes. If you need help quitting, ask your health care provider. Do not drink alcohol. General instructions Learn as much as you can about your condition. Work closely with all your health care providers to find the treatment plan that works best for you. Ask your health care provider what activities are safe for you. Keep all follow-up visits. This is important. Contact a health care provider if: Your pain treatments are not working. You are having side effects from your medicines. You are struggling with tiredness (fatigue), mood changes, depression, or anxiety. Get help right away if: You have thoughts of hurting yourself. Get help right away if you feel like you may hurt yourself or others, or have thoughts about taking your own life. Go to your nearest emergency room or: Call 911. Call the National Suicide Prevention Lifeline at 1-800-273-8255 or 988. This is open 24 hours a day. Text the Crisis Text Line at 741741. Summary Neuropathic pain is pain caused by damage to the nerves that are responsible for certain sensations in your body (sensory  nerves). Neuropathic pain may come and go as damaged nerves heal, or it may stay at the same level for years. Neuropathic pain is usually a long-term condition that can be difficult to treat. Consider joining a chronic pain support group. This information is not intended to replace advice given to you by your health care provider. Make sure you discuss any questions you have with your health care provider. Document Revised: 01/23/2021 Document Reviewed: 01/23/2021 Elsevier Patient Education  2024 Elsevier Inc.  

## 2023-01-09 NOTE — Telephone Encounter (Signed)
Noted  

## 2023-01-10 ENCOUNTER — Other Ambulatory Visit: Payer: Self-pay

## 2023-01-10 ENCOUNTER — Ambulatory Visit
Admission: RE | Admit: 2023-01-10 | Discharge: 2023-01-10 | Disposition: A | Discharge status: Home / Self Care | Payer: Medicare HMO | Source: Ambulatory Visit | Attending: Acute Care | Admitting: Acute Care

## 2023-01-10 DIAGNOSIS — F17210 Nicotine dependence, cigarettes, uncomplicated: Secondary | ICD-10-CM | POA: Diagnosis not present

## 2023-01-10 DIAGNOSIS — R911 Solitary pulmonary nodule: Secondary | ICD-10-CM

## 2023-01-10 DIAGNOSIS — Z87891 Personal history of nicotine dependence: Secondary | ICD-10-CM

## 2023-01-11 ENCOUNTER — Telehealth: Payer: Self-pay | Admitting: Family Medicine

## 2023-01-11 NOTE — Telephone Encounter (Signed)
Call Bonita Quin with [facility] at phone number listed.   She states she has faxed them to our office and wanted to see when they would be returned.   Advised that paperwork can be faxed next week.   Verbalized understanding.

## 2023-01-11 NOTE — Telephone Encounter (Signed)
Lidia from [facility] called to follow up on Home Health orders that have been submitted 4 different times. Plan of care Social Work Eval  Reschedule for Skilled Nursing  OT   Best contact: [phone] Ext. [phone]  Best fax: [phone]   Order numbers: [id number]  [phone]

## 2023-01-14 DIAGNOSIS — J449 Chronic obstructive pulmonary disease, unspecified: Secondary | ICD-10-CM | POA: Diagnosis not present

## 2023-01-14 DIAGNOSIS — J9611 Chronic respiratory failure with hypoxia: Secondary | ICD-10-CM | POA: Diagnosis not present

## 2023-01-14 DIAGNOSIS — D509 Iron deficiency anemia, unspecified: Secondary | ICD-10-CM | POA: Diagnosis not present

## 2023-01-14 DIAGNOSIS — I4891 Unspecified atrial fibrillation: Secondary | ICD-10-CM | POA: Diagnosis not present

## 2023-01-14 DIAGNOSIS — I5042 Chronic combined systolic (congestive) and diastolic (congestive) heart failure: Secondary | ICD-10-CM | POA: Diagnosis not present

## 2023-01-14 DIAGNOSIS — J9612 Chronic respiratory failure with hypercapnia: Secondary | ICD-10-CM | POA: Diagnosis not present

## 2023-01-14 DIAGNOSIS — I4892 Unspecified atrial flutter: Secondary | ICD-10-CM | POA: Diagnosis not present

## 2023-01-14 DIAGNOSIS — I110 Hypertensive heart disease with heart failure: Secondary | ICD-10-CM | POA: Diagnosis not present

## 2023-01-14 DIAGNOSIS — E1151 Type 2 diabetes mellitus with diabetic peripheral angiopathy without gangrene: Secondary | ICD-10-CM | POA: Diagnosis not present

## 2023-01-14 NOTE — Telephone Encounter (Signed)
Order has been received and faxed to [facility].

## 2023-01-14 NOTE — Progress Notes (Signed)
[facility] [place]  ASSESSMENT / PLAN: Jared [name] is a 67 y.o. male status post Left femoropopliteal angioplasty and stenting (6x126mm Eluvia x4; 6x36mm Eluvia) on 12/07/22 for ischemic rest pain.  Recommend:  Abstinence from all tobacco products. Blood glucose control with goal A1c < 7%. Blood pressure control with goal blood pressure < 140/90 mmHg. Lipid reduction therapy with goal LDL-C <70 mg/dL. Aspirin 81mg  PO QD.  Clopidogrel 75mg  PO every day.  Atorvastatin 40-80mg  PO QD (or other "high intensity" statin therapy).  Rest pain resolved. Still having some knee pain which is not new. Walking without pain. Follow up with me in 1 year with ABI and Duplex  CHIEF COMPLAINT: left leg pain  HISTORY OF PRESENT ILLNESS: Jared [name] is a 67 y.o. male referred to clinic for evaluation of left leg pain and noninvasive management suggestive of peripheral arterial disease.  The patient is a longtime smoker.  He is dependent on continuous oxygen therapy for dyspnea associated with COPD.  He is an active smoker.  He reports smoking about half pack a day.  The patient reports left leg pain which is constant and occasionally wakes him from sleep.  The pain is throughout his left calf and into the foot.  The pain is significantly worsened by walking.  He is only able to walk a few feet before the pain becomes disabling.  He has no ulcers about his feet.  01/15/23: Patient returns to clinic after endovascular intervention.  He tolerated this reasonably well.  He still has some pain behind his knee with walking and points to his joint rubbing at.  Claudication symptoms and rest pain symptoms have resolved.   Past Medical History:  Diagnosis Date   AVM (arteriovenous malformation)    CAD (coronary artery disease)    a. LHC 5/12:  LAD 20, pLCx 20, pRCA 40, dRCA 40, EF 35%, diff HK  //  b. Myoview 4/16: Overall Impression:  High risk stress nuclear study There is no evidence of  ischemia.  There is severe LV dysfunction. LV Ejection Fraction: 30%.  LV Wall Motion:  There is global LV hypokinesis.     CAP (community acquired pneumonia) 09/2013   Chronic combined systolic and diastolic CHF (congestive heart failure) (HCC)    a. Echo 4/16:Mild LVH, EF 40-45%, diffuse HK //  b. Echo 8/17: EF 35-40%, diffuse HK, diastolic dysfunction, aortic sclerosis, trivial MR, moderate LAE, normal RVSF, moderate RAE, mild TR, PASP 42 mmHg // c. Echo 4/18: Mild concentric LVH, EF 30-35, normal wall motion, grade 1 diastolic dysfunction, PASP 49   Chronic respiratory failure (HCC)    Cluster headache    "hx; haven't had one in awhile" (01/09/2016)   COPD (chronic obstructive pulmonary disease) (HCC)    [name] 01/09/2016   DM (diabetes mellitus) (HCC)    History of CVA (cerebrovascular accident)    Hypertension    IDA (iron deficiency anemia)    Moderate tobacco use disorder    NICM (nonischemic cardiomyopathy) (HCC)    Nicotine addiction    Tobacco abuse    Type 2 diabetes mellitus (HCC) 05/14/2016    Past Surgical History:  Procedure Laterality Date   ABDOMINAL AORTOGRAM W/LOWER EXTREMITY N/A 12/07/2022   Procedure: ABDOMINAL AORTOGRAM W/LOWER EXTREMITY;  Surgeon: Leonie Douglas, MD;  Location: [facility];  Service: Cardiovascular;  Laterality: N/A;   BIOPSY  11/12/2020   Procedure: BIOPSY;  Surgeon: Meridee Score Netty Starring., MD;  Location: [facility] ENDOSCOPY;  Service:  Gastroenterology;;   CARDIAC CATHETERIZATION  10/2010   LM normal, LAD with 20% irregularities, LCX with 20%, RCA with 40% prox and 40% distal - EF of 35%   CATARACT EXTRACTION, BILATERAL     COLONOSCOPY W/ BIOPSIES AND POLYPECTOMY     COLONOSCOPY WITH PROPOFOL N/A 09/06/2018   Procedure: COLONOSCOPY WITH PROPOFOL;  Surgeon: Tressia Danas, MD;  Location: [facility] ENDOSCOPY;  Service: Gastroenterology;  Laterality: N/A;   ENTEROSCOPY N/A 09/28/2018   Procedure: ENTEROSCOPY;  Surgeon: Jeani Hawking, MD;  Location: [facility]  ENDOSCOPY;  Service: Endoscopy;  Laterality: N/A;   ENTEROSCOPY N/A 10/28/2018   Procedure: ENTEROSCOPY;  Surgeon: Tressia Danas, MD;  Location: [facility] ENDOSCOPY;  Service: Gastroenterology;  Laterality: N/A;   ENTEROSCOPY N/A 10/09/2020   Procedure: ENTEROSCOPY;  Surgeon: Hilarie Fredrickson, MD;  Location: [facility] ENDOSCOPY;  Service: Endoscopy;  Laterality: N/A;   ENTEROSCOPY N/A 03/22/2022   Procedure: ENTEROSCOPY;  Surgeon: Benancio Deeds, MD;  Location: [facility] ENDOSCOPY;  Service: Gastroenterology;  Laterality: N/A;   ESOPHAGOGASTRODUODENOSCOPY N/A 11/12/2020   Procedure: ESOPHAGOGASTRODUODENOSCOPY (EGD);  Surgeon: Lemar Lofty., MD;  Location: [facility] ENDOSCOPY;  Service: Gastroenterology;  Laterality: N/A;   ESOPHAGOGASTRODUODENOSCOPY (EGD) WITH PROPOFOL N/A 09/05/2018   Procedure: ESOPHAGOGASTRODUODENOSCOPY (EGD) WITH PROPOFOL;  Surgeon: Benancio Deeds, MD;  Location: [facility] ENDOSCOPY;  Service: Gastroenterology;  Laterality: N/A;   ESOPHAGOGASTRODUODENOSCOPY (EGD) WITH PROPOFOL N/A 11/19/2020   Procedure: ESOPHAGOGASTRODUODENOSCOPY (EGD) WITH PROPOFOL;  Surgeon: Beverley Fiedler, MD;  Location: [facility] ENDOSCOPY;  Service: Gastroenterology;  Laterality: N/A;   ESOPHAGOGASTRODUODENOSCOPY (EGD) WITH PROPOFOL N/A 12/27/2022   Procedure: ESOPHAGOGASTRODUODENOSCOPY (EGD) WITH PROPOFOL;  Surgeon: Napoleon Form, MD;  Location: [facility] ENDOSCOPY;  Service: Gastroenterology;  Laterality: N/A;   EXCISION MASS HEAD     GIVENS CAPSULE STUDY N/A 09/06/2018   Procedure: GIVENS CAPSULE STUDY;  Surgeon: Tressia Danas, MD;  Location: [facility] ENDOSCOPY;  Service: Gastroenterology;  Laterality: N/A;   GIVENS CAPSULE STUDY N/A 09/26/2018   Procedure: GIVENS CAPSULE STUDY;  Surgeon: Beverley Fiedler, MD;  Location: [facility] ENDOSCOPY;  Service: Gastroenterology;  Laterality: N/A;   GIVENS CAPSULE STUDY N/A 06/14/2019   Procedure: GIVENS CAPSULE STUDY;  Surgeon: Napoleon Form, MD;  Location: [facility] ENDOSCOPY;  Service: Endoscopy;   Laterality: N/A;   HEMOSTASIS CLIP PLACEMENT  11/12/2020   Procedure: HEMOSTASIS CLIP PLACEMENT;  Surgeon: Lemar Lofty., MD;  Location: [facility] ENDOSCOPY;  Service: Gastroenterology;;   HEMOSTASIS CONTROL  11/12/2020   Procedure: HEMOSTASIS CONTROL;  Surgeon: Lemar Lofty., MD;  Location: [facility] ENDOSCOPY;  Service: Gastroenterology;;   HOT HEMOSTASIS N/A 10/28/2018   Procedure: HOT HEMOSTASIS (ARGON PLASMA COAGULATION/BICAP);  Surgeon: Tressia Danas, MD;  Location: [facility] ENDOSCOPY;  Service: Gastroenterology;  Laterality: N/A;   HOT HEMOSTASIS N/A 11/12/2020   Procedure: HOT HEMOSTASIS (ARGON PLASMA COAGULATION/BICAP);  Surgeon: Lemar Lofty., MD;  Location: [facility] ENDOSCOPY;  Service: Gastroenterology;  Laterality: N/A;   HOT HEMOSTASIS N/A 03/22/2022   Procedure: HOT HEMOSTASIS (ARGON PLASMA COAGULATION/BICAP);  Surgeon: Benancio Deeds, MD;  Location: [facility] ENDOSCOPY;  Service: Gastroenterology;  Laterality: N/A;   INCISION AND DRAINAGE PERIRECTAL ABSCESS N/A 06/05/2017   Procedure: IRRIGATION AND DEBRIDEMENT PERIRECTAL ABSCESS;  Surgeon: Andria Meuse, MD;  Location: [facility] OR;  Service: General;  Laterality: N/A;   PERIPHERAL VASCULAR INTERVENTION  12/07/2022   Procedure: PERIPHERAL VASCULAR INTERVENTION;  Surgeon: Leonie Douglas, MD;  Location: [facility]VAS[facility] CV [facility];  Service: Cardiovascular;;   SUBMUCOSAL TATTOO INJECTION  11/12/2020   Procedure: SUBMUCOSAL TATTOO INJECTION;  Surgeon:  Mansouraty, Netty Starring., MD;  Location: [facility] ENDOSCOPY;  Service: Gastroenterology;;   VIDEO BRONCHOSCOPY Bilateral 05/08/2016   Procedure: VIDEO BRONCHOSCOPY WITH FLUORO;  Surgeon: Oretha Milch, MD;  Location: [facility] ENDOSCOPY;  Service: Cardiopulmonary;  Laterality: Bilateral;    Family History  Problem Relation Age of Onset   Heart disease Mother    Diabetes Mother    Colon cancer Mother    Liver cancer Mother    Cancer Father        type unknown   Diabetes Sister        x 2   Diabetes  Brother     Social History   Socioeconomic History   Marital status: Single    Spouse name: Not on file   Number of children: 1   Years of education: Not on file   Highest education level: Not on file  Occupational History   Occupation: retired  Tobacco Use   Smoking status: Every Day    Current packs/day: 0.50    Average packs/day: 0.5 packs/day for 47.0 years (23.5 ttl pk-yrs)    Types: Cigarettes   Smokeless tobacco: Never   Tobacco comments:    4/5 cigs  Vaping Use   Vaping status: Never Used  Substance and Sexual Activity   Alcohol use: Not Currently    Alcohol/week: 0.0 standard drinks of alcohol    Comment: last drink was before xmas   Drug use: No    Types: Cocaine, Marijuana    Comment: "nothing in 20 years"   Sexual activity: Not Currently  Other Topics Concern   Not on file  Social History Narrative   unemployed   Social Determinants of Health   Financial Resource Strain: Low Risk  (05/25/2022)   Overall Financial Resource Strain (CARDIA)    Difficulty of Paying Living Expenses: Not hard at all  Food Insecurity: No Food Insecurity (12/26/2022)   Hunger Vital Sign    Worried About Running Out of Food in the Last Year: Never true    Ran Out of Food in the Last Year: Never true  Transportation Needs: No Transportation Needs (12/26/2022)   PRAPARE - Administrator, Civil Service (Medical): No    Lack of Transportation (Non-Medical): No  Physical Activity: Inactive (05/25/2022)   Exercise Vital Sign    Days of Exercise per Week: 0 days    Minutes of Exercise per Session: 0 min  Stress: No Stress Concern Present (05/25/2022)   [employer] of Occupational Health - Occupational Stress Questionnaire    Feeling of Stress : Not at all  Social Connections: Unknown (05/25/2022)   Social Connection and Isolation Panel [NHANES]    Frequency of Communication with Friends and Family: Three times a week    Frequency of Social Gatherings with Friends  and Family: Never    Attends Religious Services: Not on Marketing executive or Organizations: No    Attends Banker Meetings: Never    Marital Status: Divorced  Catering manager Violence: Not At Risk (12/26/2022)   Humiliation, Afraid, Rape, and Kick questionnaire    Fear of Current or Ex-Partner: No    Emotionally Abused: No    Physically Abused: No    Sexually Abused: No    Allergies  Allergen Reactions   Lisinopril Cough    Current Outpatient Medications  Medication Sig Dispense Refill   Accu-Chek Softclix Lancets lancets Use to check blood sugar once daily. 100 each 6  acetaminophen (TYLENOL) 500 MG tablet Take 2 tablets (1,000 mg total) by mouth every 8 (eight) hours as needed for moderate pain. 100 tablet 1   albuterol (VENTOLIN HFA) 108 (90 Base) MCG/ACT inhaler INHALE 2 PUFFS INTO THE LUNGS EVERY 6 (SIX) HOURS AS NEEDED FOR WHEEZING OR SHORTNESS OF BREATH. (Patient taking differently: Inhale 2 puffs into the lungs daily as needed for wheezing or shortness of breath.) 54 g 0   aspirin EC 81 MG tablet Take 1 tablet (81 mg total) by mouth daily. Swallow whole. 150 tablet 2   atorvastatin (LIPITOR) 80 MG tablet Take 1 tablet (80 mg total) by mouth daily. 90 tablet 1   Blood Glucose Monitoring Suppl (ACCU-CHEK GUIDE) w/Device KIT Use to check blood sugar once daily. 1 kit 0   Budeson-Glycopyrrol-Formoterol (BREZTRI AEROSPHERE) 160-9-4.8 MCG/ACT AERO Take 2 puffs first thing in morning and then another 2 puffs about 12 hours later. (Patient taking differently: Inhale 2 puffs into the lungs in the morning and at bedtime.) 10.7 g 6   DULoxetine (CYMBALTA) 60 MG capsule Take 1 capsule (60 mg total) by mouth daily. For chronic leg pains 30 capsule 3   ergocalciferol (DRISDOL) 1.25 MG (50000 UT) capsule Take 1 capsule (50,000 Units total) by mouth once a week. 12 capsule 1   ezetimibe (ZETIA) 10 MG tablet Take 1 tablet (10 mg total) by mouth daily. 90 tablet 3    ferrous sulfate 325 (65 FE) MG tablet Take 1 tablet (325 mg total) by mouth daily with breakfast. (Patient taking differently: Take 325 mg by mouth every evening.) 60 tablet 1   folic acid (FOLVITE) 1 MG tablet Take 1 tablet (1 mg total) by mouth daily. 90 tablet 0   furosemide (LASIX) 40 MG tablet Take 1-2 tablets (40-80 mg total) by mouth 2 (two) times daily as needed.Take an extra pill with worsening pedal edema. 270 tablet 1   gabapentin (NEURONTIN) 300 MG capsule Take 1 capsule (300 mg total) by mouth 2 (two) times daily. 90 capsule 1   glucose blood (ACCU-CHEK GUIDE) test strip Use to check blood sugar once daily. 100 each 6   hydrALAZINE (APRESOLINE) 50 MG tablet Take 1 tablet (50 mg total) by mouth every 8 (eight) hours. 90 tablet 0   isosorbide mononitrate (IMDUR) 30 MG 24 hr tablet Take 1 tablet (30 mg total) by mouth daily. 90 tablet 1   metoprolol succinate (TOPROL-XL) 50 MG 24 hr tablet Take 1 tablet (50 mg total) by mouth daily. Take with or immediately following a meal. 90 tablet 1   nitroGLYCERIN (NITROSTAT) 0.4 MG SL tablet Place 1 tablet (0.4 mg total) under the tongue every 5 (five) minutes as needed for chest pain. (Patient not taking: Reported on 12/04/2022)     OXYGEN Inhale 2 L/min into the lungs continuous.     pantoprazole (PROTONIX) 40 MG tablet Take 1 tablet (40 mg total) by mouth daily. 90 tablet 1   rivaroxaban (XARELTO) 20 MG TABS tablet Take 1 tablet (20 mg total) by mouth daily with supper. 90 tablet 1   No current facility-administered medications for this visit.    PHYSICAL EXAM There were no vitals filed for this visit.  Chronically ill-appearing gentleman in no acute distress Regular rate and rhythm On home oxygen via continuous nasal cannula 2+ femoral pulses bilaterally 1+ L PT Absent R pedal pulses  PERTINENT LABORATORY AND RADIOLOGIC DATA  Most recent CBC    Latest Ref Rng & Units 01/09/2023   10:29  AM 12/28/2022    3:09 AM 12/27/2022    9:16 AM   CBC  WBC 3.4 - 10.8 x10E3/uL 9.4  12.7    Hemoglobin 13.0 - 17.7 g/dL 9.8  7.9  9.9   Hematocrit 37.5 - 51.0 % 33.9  28.2  29.0   Platelets 150 - 450 x10E3/uL 312  391       Most recent CMP    Latest Ref Rng & Units 12/28/2022    3:09 AM 12/27/2022    9:16 AM 12/26/2022   11:45 PM  CMP  Glucose 70 - 99 mg/dL 161  096  045   BUN 8 - 23 mg/dL 15  16  15    Creatinine 0.61 - 1.24 mg/dL 4.09  8.11  9.14   Sodium 135 - 145 mmol/L 138  139  136   Potassium 3.5 - 5.1 mmol/L 3.7  4.1  4.3   Chloride 98 - 111 mmol/L 98  100  97   CO2 22 - 32 mmol/L 31   29   Calcium 8.9 - 10.3 mg/dL 8.7   8.8     +-------+-----------+-----------+------------+------------+  ABI/TBIToday's ABIToday's TBIPrevious ABIPrevious TBI  +-------+-----------+-----------+------------+------------+  Right 0.88       0.83       0.73        0.58          +-------+-----------+-----------+------------+------------+  Left  0.96       1.16       0.56        0             +-------+-----------+-----------+------------+------------+   Left: 50-74% stenosis noted in the deep femoral artery. Patent stent  without evidence of stenosis.     Rande Brunt. Lenell Antu, MD [facility] Phone Number: [phone] 01/14/2023 3:16 PM   Total time spent on preparing this encounter including chart review, data review, collecting history, examining the patient, coordinating care for this established patient, 30 minutes.  Portions of this report may have been transcribed using voice recognition software.  Every effort has been made to ensure accuracy; however, inadvertent computerized transcription errors may still be present.

## 2023-01-15 ENCOUNTER — Encounter: Payer: Self-pay | Admitting: Vascular Surgery

## 2023-01-15 ENCOUNTER — Ambulatory Visit (INDEPENDENT_AMBULATORY_CARE_PROVIDER_SITE_OTHER)
Admission: RE | Admit: 2023-01-15 | Discharge: 2023-01-15 | Disposition: A | Discharge status: Home / Self Care | Payer: Medicare HMO | Source: Ambulatory Visit | Attending: Vascular Surgery | Admitting: Vascular Surgery

## 2023-01-15 ENCOUNTER — Other Ambulatory Visit: Payer: Self-pay

## 2023-01-15 ENCOUNTER — Ambulatory Visit (HOSPITAL_COMMUNITY)
Admission: RE | Admit: 2023-01-15 | Discharge: 2023-01-15 | Disposition: A | Discharge status: Home / Self Care | Payer: Medicare HMO | Source: Ambulatory Visit | Attending: Vascular Surgery | Admitting: Vascular Surgery

## 2023-01-15 ENCOUNTER — Ambulatory Visit (INDEPENDENT_AMBULATORY_CARE_PROVIDER_SITE_OTHER): Payer: Medicare HMO | Admitting: Vascular Surgery

## 2023-01-15 VITALS — BP 114/63 | HR 110 | Temp 97.9°F | Ht 75.0 in | Wt 213.0 lb

## 2023-01-15 DIAGNOSIS — I70229 Atherosclerosis of native arteries of extremities with rest pain, unspecified extremity: Secondary | ICD-10-CM | POA: Insufficient documentation

## 2023-01-15 DIAGNOSIS — I70222 Atherosclerosis of native arteries of extremities with rest pain, left leg: Secondary | ICD-10-CM | POA: Insufficient documentation

## 2023-01-15 DIAGNOSIS — I739 Peripheral vascular disease, unspecified: Secondary | ICD-10-CM | POA: Diagnosis present

## 2023-01-15 DIAGNOSIS — I70223 Atherosclerosis of native arteries of extremities with rest pain, bilateral legs: Secondary | ICD-10-CM | POA: Diagnosis not present

## 2023-01-15 LAB — VAS US ABI WITH/WO TBI
Left ABI: 0.96
Right ABI: 0.88

## 2023-01-16 ENCOUNTER — Inpatient Hospital Stay: Payer: Medicare HMO | Admitting: Physician Assistant

## 2023-01-16 ENCOUNTER — Telehealth: Payer: Self-pay | Admitting: Family Medicine

## 2023-01-16 NOTE — Telephone Encounter (Signed)
Forms has been faxed.

## 2023-01-16 NOTE — Telephone Encounter (Signed)
Lydia from [facility] called to follow up on Home Health orders that have been submitted 4 different times. Plan of care Social Work Eval  Reschedule for Skilled Nursing  OT     Best contact: [phone] Ext. [phone] attn: Jolyne Loa fax: [phone]   Order numbers: [id number] [phone]  Isabelle Course stated they never recvd the fax and request to send to an alternate fax.

## 2023-01-18 ENCOUNTER — Other Ambulatory Visit: Payer: Self-pay | Admitting: Acute Care

## 2023-01-18 DIAGNOSIS — Z87891 Personal history of nicotine dependence: Secondary | ICD-10-CM

## 2023-01-18 DIAGNOSIS — Z122 Encounter for screening for malignant neoplasm of respiratory organs: Secondary | ICD-10-CM

## 2023-01-19 DIAGNOSIS — J9611 Chronic respiratory failure with hypoxia: Secondary | ICD-10-CM | POA: Diagnosis not present

## 2023-01-19 DIAGNOSIS — J9612 Chronic respiratory failure with hypercapnia: Secondary | ICD-10-CM | POA: Diagnosis not present

## 2023-01-19 DIAGNOSIS — I4891 Unspecified atrial fibrillation: Secondary | ICD-10-CM | POA: Diagnosis not present

## 2023-01-19 DIAGNOSIS — J449 Chronic obstructive pulmonary disease, unspecified: Secondary | ICD-10-CM | POA: Diagnosis not present

## 2023-01-19 DIAGNOSIS — I110 Hypertensive heart disease with heart failure: Secondary | ICD-10-CM | POA: Diagnosis not present

## 2023-01-19 DIAGNOSIS — E1151 Type 2 diabetes mellitus with diabetic peripheral angiopathy without gangrene: Secondary | ICD-10-CM | POA: Diagnosis not present

## 2023-01-19 DIAGNOSIS — I5042 Chronic combined systolic (congestive) and diastolic (congestive) heart failure: Secondary | ICD-10-CM | POA: Diagnosis not present

## 2023-01-19 DIAGNOSIS — D509 Iron deficiency anemia, unspecified: Secondary | ICD-10-CM | POA: Diagnosis not present

## 2023-01-19 DIAGNOSIS — I4892 Unspecified atrial flutter: Secondary | ICD-10-CM | POA: Diagnosis not present

## 2023-01-21 DIAGNOSIS — J449 Chronic obstructive pulmonary disease, unspecified: Secondary | ICD-10-CM | POA: Diagnosis not present

## 2023-01-21 DIAGNOSIS — D509 Iron deficiency anemia, unspecified: Secondary | ICD-10-CM | POA: Diagnosis not present

## 2023-01-21 DIAGNOSIS — I5042 Chronic combined systolic (congestive) and diastolic (congestive) heart failure: Secondary | ICD-10-CM | POA: Diagnosis not present

## 2023-01-21 DIAGNOSIS — I4891 Unspecified atrial fibrillation: Secondary | ICD-10-CM | POA: Diagnosis not present

## 2023-01-21 DIAGNOSIS — I4892 Unspecified atrial flutter: Secondary | ICD-10-CM | POA: Diagnosis not present

## 2023-01-21 DIAGNOSIS — J9611 Chronic respiratory failure with hypoxia: Secondary | ICD-10-CM | POA: Diagnosis not present

## 2023-01-21 DIAGNOSIS — J9612 Chronic respiratory failure with hypercapnia: Secondary | ICD-10-CM | POA: Diagnosis not present

## 2023-01-21 DIAGNOSIS — I110 Hypertensive heart disease with heart failure: Secondary | ICD-10-CM | POA: Diagnosis not present

## 2023-01-21 DIAGNOSIS — E1151 Type 2 diabetes mellitus with diabetic peripheral angiopathy without gangrene: Secondary | ICD-10-CM | POA: Diagnosis not present

## 2023-01-22 ENCOUNTER — Other Ambulatory Visit: Payer: Self-pay

## 2023-01-22 DIAGNOSIS — I739 Peripheral vascular disease, unspecified: Secondary | ICD-10-CM

## 2023-01-23 DIAGNOSIS — E1151 Type 2 diabetes mellitus with diabetic peripheral angiopathy without gangrene: Secondary | ICD-10-CM | POA: Diagnosis not present

## 2023-01-23 DIAGNOSIS — J9612 Chronic respiratory failure with hypercapnia: Secondary | ICD-10-CM | POA: Diagnosis not present

## 2023-01-23 DIAGNOSIS — I4891 Unspecified atrial fibrillation: Secondary | ICD-10-CM | POA: Diagnosis not present

## 2023-01-23 DIAGNOSIS — I5042 Chronic combined systolic (congestive) and diastolic (congestive) heart failure: Secondary | ICD-10-CM | POA: Diagnosis not present

## 2023-01-23 DIAGNOSIS — D509 Iron deficiency anemia, unspecified: Secondary | ICD-10-CM | POA: Diagnosis not present

## 2023-01-23 DIAGNOSIS — J449 Chronic obstructive pulmonary disease, unspecified: Secondary | ICD-10-CM | POA: Diagnosis not present

## 2023-01-23 DIAGNOSIS — J9611 Chronic respiratory failure with hypoxia: Secondary | ICD-10-CM | POA: Diagnosis not present

## 2023-01-23 DIAGNOSIS — I110 Hypertensive heart disease with heart failure: Secondary | ICD-10-CM | POA: Diagnosis not present

## 2023-01-23 DIAGNOSIS — I4892 Unspecified atrial flutter: Secondary | ICD-10-CM | POA: Diagnosis not present

## 2023-01-24 DIAGNOSIS — D509 Iron deficiency anemia, unspecified: Secondary | ICD-10-CM | POA: Diagnosis not present

## 2023-01-24 DIAGNOSIS — I5042 Chronic combined systolic (congestive) and diastolic (congestive) heart failure: Secondary | ICD-10-CM | POA: Diagnosis not present

## 2023-01-24 DIAGNOSIS — J9612 Chronic respiratory failure with hypercapnia: Secondary | ICD-10-CM | POA: Diagnosis not present

## 2023-01-24 DIAGNOSIS — I4891 Unspecified atrial fibrillation: Secondary | ICD-10-CM | POA: Diagnosis not present

## 2023-01-24 DIAGNOSIS — I110 Hypertensive heart disease with heart failure: Secondary | ICD-10-CM | POA: Diagnosis not present

## 2023-01-24 DIAGNOSIS — I4892 Unspecified atrial flutter: Secondary | ICD-10-CM | POA: Diagnosis not present

## 2023-01-24 DIAGNOSIS — J9611 Chronic respiratory failure with hypoxia: Secondary | ICD-10-CM | POA: Diagnosis not present

## 2023-01-24 DIAGNOSIS — J449 Chronic obstructive pulmonary disease, unspecified: Secondary | ICD-10-CM | POA: Diagnosis not present

## 2023-01-24 DIAGNOSIS — E1151 Type 2 diabetes mellitus with diabetic peripheral angiopathy without gangrene: Secondary | ICD-10-CM | POA: Diagnosis not present

## 2023-01-24 NOTE — Telephone Encounter (Signed)
Isabelle Course from [facility] She stated she has been going back and forth with the office about orders for this pt. Stated wanted to let Cassandra know she is re-faxing orders this morning to Cassandra's attention.  Order numbers  - [id number] / [id number] / [id number]  Please advise.

## 2023-01-24 NOTE — Telephone Encounter (Signed)
Orders has been faxed to number provided.

## 2023-01-28 ENCOUNTER — Inpatient Hospital Stay: Payer: Medicare HMO

## 2023-01-28 ENCOUNTER — Inpatient Hospital Stay: Payer: Medicare HMO | Attending: Nurse Practitioner | Admitting: Nurse Practitioner

## 2023-01-28 VITALS — BP 104/58 | HR 102 | Temp 99.9°F | Resp 18 | Ht 74.5 in | Wt 216.0 lb

## 2023-01-28 DIAGNOSIS — K279 Peptic ulcer, site unspecified, unspecified as acute or chronic, without hemorrhage or perforation: Secondary | ICD-10-CM | POA: Insufficient documentation

## 2023-01-28 DIAGNOSIS — Z7901 Long term (current) use of anticoagulants: Secondary | ICD-10-CM | POA: Diagnosis not present

## 2023-01-28 DIAGNOSIS — J9612 Chronic respiratory failure with hypercapnia: Secondary | ICD-10-CM | POA: Diagnosis not present

## 2023-01-28 DIAGNOSIS — F17210 Nicotine dependence, cigarettes, uncomplicated: Secondary | ICD-10-CM | POA: Insufficient documentation

## 2023-01-28 DIAGNOSIS — I110 Hypertensive heart disease with heart failure: Secondary | ICD-10-CM | POA: Diagnosis not present

## 2023-01-28 DIAGNOSIS — J449 Chronic obstructive pulmonary disease, unspecified: Secondary | ICD-10-CM | POA: Diagnosis not present

## 2023-01-28 DIAGNOSIS — Z9981 Dependence on supplemental oxygen: Secondary | ICD-10-CM | POA: Insufficient documentation

## 2023-01-28 DIAGNOSIS — I4891 Unspecified atrial fibrillation: Secondary | ICD-10-CM | POA: Diagnosis not present

## 2023-01-28 DIAGNOSIS — E785 Hyperlipidemia, unspecified: Secondary | ICD-10-CM | POA: Insufficient documentation

## 2023-01-28 DIAGNOSIS — D500 Iron deficiency anemia secondary to blood loss (chronic): Secondary | ICD-10-CM | POA: Diagnosis not present

## 2023-01-28 DIAGNOSIS — Z800 Family history of malignant neoplasm of digestive organs: Secondary | ICD-10-CM | POA: Insufficient documentation

## 2023-01-28 DIAGNOSIS — E1151 Type 2 diabetes mellitus with diabetic peripheral angiopathy without gangrene: Secondary | ICD-10-CM | POA: Diagnosis not present

## 2023-01-28 DIAGNOSIS — E119 Type 2 diabetes mellitus without complications: Secondary | ICD-10-CM | POA: Diagnosis not present

## 2023-01-28 DIAGNOSIS — I4892 Unspecified atrial flutter: Secondary | ICD-10-CM | POA: Diagnosis not present

## 2023-01-28 DIAGNOSIS — Z808 Family history of malignant neoplasm of other organs or systems: Secondary | ICD-10-CM | POA: Insufficient documentation

## 2023-01-28 DIAGNOSIS — I5042 Chronic combined systolic (congestive) and diastolic (congestive) heart failure: Secondary | ICD-10-CM | POA: Diagnosis not present

## 2023-01-28 DIAGNOSIS — D509 Iron deficiency anemia, unspecified: Secondary | ICD-10-CM | POA: Diagnosis not present

## 2023-01-28 DIAGNOSIS — J9611 Chronic respiratory failure with hypoxia: Secondary | ICD-10-CM | POA: Diagnosis not present

## 2023-01-28 LAB — CBC WITH DIFFERENTIAL (CANCER CENTER ONLY)
Abs Immature Granulocytes: 0.01 10*3/uL (ref 0.00–0.07)
Basophils Absolute: 0.1 10*3/uL (ref 0.0–0.1)
Basophils Relative: 1 %
Eosinophils Absolute: 0.2 10*3/uL (ref 0.0–0.5)
Eosinophils Relative: 2 %
HCT: 36.2 % — ABNORMAL LOW (ref 39.0–52.0)
Hemoglobin: 10.6 g/dL — ABNORMAL LOW (ref 13.0–17.0)
Immature Granulocytes: 0 %
Lymphocytes Relative: 22 %
Lymphs Abs: 1.9 10*3/uL (ref 0.7–4.0)
MCH: 25.7 pg — ABNORMAL LOW (ref 26.0–34.0)
MCHC: 29.3 g/dL — ABNORMAL LOW (ref 30.0–36.0)
MCV: 87.7 fL (ref 80.0–100.0)
Monocytes Absolute: 0.8 10*3/uL (ref 0.1–1.0)
Monocytes Relative: 9 %
Neutro Abs: 5.9 10*3/uL (ref 1.7–7.7)
Neutrophils Relative %: 66 %
Platelet Count: 306 10*3/uL (ref 150–400)
RBC: 4.13 MIL/uL — ABNORMAL LOW (ref 4.22–5.81)
RDW: 17 % — ABNORMAL HIGH (ref 11.5–15.5)
WBC Count: 8.8 10*3/uL (ref 4.0–10.5)
nRBC: 0 % (ref 0.0–0.2)

## 2023-01-28 LAB — IRON AND IRON BINDING CAPACITY (CC-WL,HP ONLY)
Iron: 29 ug/dL — ABNORMAL LOW (ref 45–182)
Saturation Ratios: 8 % — ABNORMAL LOW (ref 17.9–39.5)
TIBC: 367 ug/dL (ref 250–450)
UIBC: 338 ug/dL (ref 117–376)

## 2023-01-28 LAB — FERRITIN: Ferritin: 10 ng/mL — ABNORMAL LOW (ref 24–336)

## 2023-01-28 NOTE — Progress Notes (Unsigned)
Rheumatology she was seen in follow-up Mosetta Putt currently holding due to pressure do not provide review of biopsy culture results this [facility]   [phone] [phone]   Clinic New consult Note   Patient Care Team: [name], MD as PCP - General (Family Medicine) Pricilla Riffle, MD as PCP - Cardiology (Cardiology) Date of Service: 01/29/2023  CHIEF COMPLAINTS/PURPOSE OF CONSULTATION:  Iron deficiency anemia, referred by PCP Dr. [name],   HISTORY OF PRESENTING ILLNESS:  Jared [name] 67 y.o. male with PMH including Afib on anticoagulation, CAD, nonischemic dilated cardiomyopathy, HL, Emphysema/COPD, PAD s/p left fem-pop angioplasty and stenting 11/2022, DM, h/o PUD and small bowel AVms is here because of iron deficiency anemia.  First found to have iron deficiency 09/05/2018 with serum iron 9, 3% saturation, TIBC 358, and ferritin of 3 with a hemoglobin of 4.9, admitted for eval/management and transfused 4 units PRBCs (has received multiple transfusions since then most recently 12/27/2022).  EGD showed esophagitis, colonoscopy and small bowel endoscopy were unremarkable. Anemia improved until he developed acute on chronic anemia Hgb 7.7, repeat small bowel endoscopy 10/28/2018 showed a single nonbleeding AVM treated with APC. He began receiving IV iron at that time (Feraheme 510 mg x1 episodically). On a subsequent admission he underwent capsule endoscopy 06/18/19 which showed gastritis, no AVMs or bleeding. He saw Dr. Clelia Croft 07/21/19 and 09/17/19 with normalization of his anemia and ferritin therefore no further interventions were recommended and he d/c'd oral iron. He had recurrent IDA in 4/22 ferritin 7, repeat small bowel endoscopy 10/09/20 was normal, but procedure in 11/12/20 by Dr. Meridee Score showed gastritis and 2 gastric ulcers. It was felt IDA to be secondary to intermittent mucosal oozing in a patient on chronic anticoagulation therapy and also inadequate iron  replacement. Continued periodic IV Feraheme and it was recommended he be on oral iron indefinitely.  Hgb of 12.3 on 07/26/2022 but has progressively dropped, to 9.5 11/2022 and most recently down to 5.9 on 12/26/2022, he was admitted.  Iron panel showed ferritin 3, iron 5, 1% saturation, and TIBC 441.  Endoscopy showed no bleeding.  He was transfused and received Ferrlecit 250 mg x1 on 12/27/22. Discharged 7/19.   Socially, he is single, has 1 healthy son.  He was at [employer] from 73-74, a retired Arts development officer. No h/o bariatric surgery. He is independent with ADLs but does not drive due to recurrent syncopal episodes from iron deficiency anemia.  He is up-to-date on cancer screenings.  He has alcohol twice per year, no drug use, and a current daily smoker x 50 years at 1/2 pack/day. Family h/o mother with lung cancer and father with cancer of unknown type.  Today he presents by himself, on 3 liters portable oxygen (2 L on home pump which is continuous). Feels a little weak but better since hospital discharge. When he is severely anemic he feels very weak and "shaky." Denies pica or other bleeding such as epistaxis or hematuria. On ferrous sulfate BID for past 2 weeks. Tolerating fine, mild constipation and dark stools. Denies melena or hematochezia. Still on xarelto and vascular surgeon added baby aspirin.     MEDICAL HISTORY:  Past Medical History:  Diagnosis Date   AVM (arteriovenous malformation)    CAD (coronary artery disease)    a. LHC 5/12:  LAD 20, pLCx 20, pRCA 40, dRCA 40, EF 35%, diff HK  //  b. Myoview 4/16: Overall Impression:  High risk stress nuclear study There is no  evidence of ischemia.  There is severe LV dysfunction. LV Ejection Fraction: 30%.  LV Wall Motion:  There is global LV hypokinesis.     CAP (community acquired pneumonia) 09/2013   Chronic combined systolic and diastolic CHF (congestive heart failure) (HCC)    a. Echo 4/16:Mild LVH, EF 40-45%, diffuse HK //  b. Echo 8/17: EF  35-40%, diffuse HK, diastolic dysfunction, aortic sclerosis, trivial MR, moderate LAE, normal RVSF, moderate RAE, mild TR, PASP 42 mmHg // c. Echo 4/18: Mild concentric LVH, EF 30-35, normal wall motion, grade 1 diastolic dysfunction, PASP 49   Chronic respiratory failure (HCC)    Cluster headache    "hx; haven't had one in awhile" (01/09/2016)   COPD (chronic obstructive pulmonary disease) (HCC)    [name] 01/09/2016   DM (diabetes mellitus) (HCC)    History of CVA (cerebrovascular accident)    Hypertension    IDA (iron deficiency anemia)    Moderate tobacco use disorder    NICM (nonischemic cardiomyopathy) (HCC)    Nicotine addiction    Tobacco abuse    Type 2 diabetes mellitus (HCC) 05/14/2016    SURGICAL HISTORY: Past Surgical History:  Procedure Laterality Date   ABDOMINAL AORTOGRAM W/LOWER EXTREMITY N/A 12/07/2022   Procedure: ABDOMINAL AORTOGRAM W/LOWER EXTREMITY;  Surgeon: Leonie Douglas, MD;  [facility];  Service: Cardiovascular;  Laterality: N/A;   BIOPSY  11/12/2020   Procedure: BIOPSY;  Surgeon: Lemar Lofty., MD;  Location: [facility] ENDOSCOPY;  Service: Gastroenterology;;   CARDIAC CATHETERIZATION  10/2010   LM normal, LAD with 20% irregularities, LCX with 20%, RCA with 40% prox and 40% distal - EF of 35%   CATARACT EXTRACTION, BILATERAL     COLONOSCOPY W/ BIOPSIES AND POLYPECTOMY     COLONOSCOPY WITH PROPOFOL N/A 09/06/2018   Procedure: COLONOSCOPY WITH PROPOFOL;  Surgeon: Tressia Danas, MD;  Location: [facility] ENDOSCOPY;  Service: Gastroenterology;  Laterality: N/A;   ENTEROSCOPY N/A 09/28/2018   Procedure: ENTEROSCOPY;  Surgeon: Jeani Hawking, MD;  Location: [facility] ENDOSCOPY;  Service: Endoscopy;  Laterality: N/A;   ENTEROSCOPY N/A 10/28/2018   Procedure: ENTEROSCOPY;  Surgeon: Tressia Danas, MD;  Location: [facility] ENDOSCOPY;  Service: Gastroenterology;  Laterality: N/A;   ENTEROSCOPY N/A 10/09/2020   Procedure: ENTEROSCOPY;  Surgeon: Hilarie Fredrickson, MD;   Location: [facility] ENDOSCOPY;  Service: Endoscopy;  Laterality: N/A;   ENTEROSCOPY N/A 03/22/2022   Procedure: ENTEROSCOPY;  Surgeon: Benancio Deeds, MD;  Location: [facility] ENDOSCOPY;  Service: Gastroenterology;  Laterality: N/A;   ESOPHAGOGASTRODUODENOSCOPY N/A 11/12/2020   Procedure: ESOPHAGOGASTRODUODENOSCOPY (EGD);  Surgeon: Lemar Lofty., MD;  Location: [facility] ENDOSCOPY;  Service: Gastroenterology;  Laterality: N/A;   ESOPHAGOGASTRODUODENOSCOPY (EGD) WITH PROPOFOL N/A 09/05/2018   Procedure: ESOPHAGOGASTRODUODENOSCOPY (EGD) WITH PROPOFOL;  Surgeon: Benancio Deeds, MD;  Location: [facility] ENDOSCOPY;  Service: Gastroenterology;  Laterality: N/A;   ESOPHAGOGASTRODUODENOSCOPY (EGD) WITH PROPOFOL N/A 11/19/2020   Procedure: ESOPHAGOGASTRODUODENOSCOPY (EGD) WITH PROPOFOL;  Surgeon: Beverley Fiedler, MD;  Location: [facility] ENDOSCOPY;  Service: Gastroenterology;  Laterality: N/A;   ESOPHAGOGASTRODUODENOSCOPY (EGD) WITH PROPOFOL N/A 12/27/2022   Procedure: ESOPHAGOGASTRODUODENOSCOPY (EGD) WITH PROPOFOL;  Surgeon: Napoleon Form, MD;  Location: [facility] ENDOSCOPY;  Service: Gastroenterology;  Laterality: N/A;   EXCISION MASS HEAD     GIVENS CAPSULE STUDY N/A 09/06/2018   Procedure: GIVENS CAPSULE STUDY;  Surgeon: Tressia Danas, MD;  Location: [facility] ENDOSCOPY;  Service: Gastroenterology;  Laterality: N/A;   GIVENS CAPSULE STUDY N/A 09/26/2018   Procedure: GIVENS CAPSULE STUDY;  Surgeon: Rhea Belton,  Carie Caddy, MD;  Location: [facility] ENDOSCOPY;  Service: Gastroenterology;  Laterality: N/A;   GIVENS CAPSULE STUDY N/A 06/14/2019   Procedure: GIVENS CAPSULE STUDY;  Surgeon: Napoleon Form, MD;  Location: [facility] ENDOSCOPY;  Service: Endoscopy;  Laterality: N/A;   HEMOSTASIS CLIP PLACEMENT  11/12/2020   Procedure: HEMOSTASIS CLIP PLACEMENT;  Surgeon: Lemar Lofty., MD;  Location: [facility] ENDOSCOPY;  Service: Gastroenterology;;   HEMOSTASIS CONTROL  11/12/2020   Procedure: HEMOSTASIS CONTROL;  Surgeon: Lemar Lofty., MD;   Location: [facility] ENDOSCOPY;  Service: Gastroenterology;;   HOT HEMOSTASIS N/A 10/28/2018   Procedure: HOT HEMOSTASIS (ARGON PLASMA COAGULATION/BICAP);  Surgeon: Tressia Danas, MD;  Location: [facility] ENDOSCOPY;  Service: Gastroenterology;  Laterality: N/A;   HOT HEMOSTASIS N/A 11/12/2020   Procedure: HOT HEMOSTASIS (ARGON PLASMA COAGULATION/BICAP);  Surgeon: Lemar Lofty., MD;  Location: [facility] ENDOSCOPY;  Service: Gastroenterology;  Laterality: N/A;   HOT HEMOSTASIS N/A 03/22/2022   Procedure: HOT HEMOSTASIS (ARGON PLASMA COAGULATION/BICAP);  Surgeon: Benancio Deeds, MD;  Location: [facility] ENDOSCOPY;  Service: Gastroenterology;  Laterality: N/A;   INCISION AND DRAINAGE PERIRECTAL ABSCESS N/A 06/05/2017   Procedure: IRRIGATION AND DEBRIDEMENT PERIRECTAL ABSCESS;  Surgeon: Andria Meuse, MD;  Location: [facility] OR;  Service: General;  Laterality: N/A;   PERIPHERAL VASCULAR INTERVENTION  12/07/2022   Procedure: PERIPHERAL VASCULAR INTERVENTION;  Surgeon: Leonie Douglas, MD;  [facility]: [facility];  Service: Cardiovascular;;   SUBMUCOSAL TATTOO INJECTION  11/12/2020   Procedure: SUBMUCOSAL TATTOO INJECTION;  Surgeon: Lemar Lofty., MD;  Location: [facility] ENDOSCOPY;  Service: Gastroenterology;;   VIDEO BRONCHOSCOPY Bilateral 05/08/2016   Procedure: VIDEO BRONCHOSCOPY WITH FLUORO;  Surgeon: Oretha Milch, MD;  Location: [facility] ENDOSCOPY;  Service: Cardiopulmonary;  Laterality: Bilateral;    SOCIAL HISTORY: Social History   Socioeconomic History   Marital status: Single    Spouse name: Not on file   Number of children: 1   Years of education: Not on file   Highest education level: Not on file  Occupational History   Occupation: retired  Tobacco Use   Smoking status: Every Day    Current packs/day: 0.50    Average packs/day: 0.5 packs/day for 47.0 years (23.5 ttl pk-yrs)    Types: Cigarettes   Smokeless tobacco: Never   Tobacco comments:    4/5 cigs  Vaping Use   Vaping status:  Never Used  Substance and Sexual Activity   Alcohol use: Not Currently    Alcohol/week: 0.0 standard drinks of alcohol    Comment: last drink was before xmas   Drug use: No    Types: Cocaine, Marijuana    Comment: "nothing in 20 years"   Sexual activity: Not Currently  Other Topics Concern   Not on file  Social History Narrative   unemployed   Social Determinants of Health   Financial Resource Strain: Low Risk  (05/25/2022)   Overall Financial Resource Strain (CARDIA)    Difficulty of Paying Living Expenses: Not hard at all  Food Insecurity: No Food Insecurity (12/26/2022)   Hunger Vital Sign    Worried About Running Out of Food in the Last Year: Never true    Ran Out of Food in the Last Year: Never true  Transportation Needs: No Transportation Needs (12/26/2022)   PRAPARE - Administrator, Civil Service (Medical): No    Lack of Transportation (Non-Medical): No  Physical Activity: Inactive (05/25/2022)   Exercise Vital Sign    Days of Exercise per Week: 0 days  Minutes of Exercise per Session: 0 min  Stress: No Stress Concern Present (05/25/2022)   [employer] of Occupational Health - Occupational Stress Questionnaire    Feeling of Stress : Not at all  Social Connections: Unknown (05/25/2022)   Social Connection and Isolation Panel [NHANES]    Frequency of Communication with Friends and Family: Three times a week    Frequency of Social Gatherings with Friends and Family: Never    Attends Religious Services: Not on Marketing executive or Organizations: No    Attends Banker Meetings: Never    Marital Status: Divorced  Catering manager Violence: Not At Risk (12/26/2022)   Humiliation, Afraid, Rape, and Kick questionnaire    Fear of Current or Ex-Partner: No    Emotionally Abused: No    Physically Abused: No    Sexually Abused: No    FAMILY HISTORY: Family History  Problem Relation Age of Onset   Heart disease Mother     Diabetes Mother    Colon cancer Mother    Liver cancer Mother    Cancer Father        type unknown   Diabetes Sister        x 2   Diabetes Brother     ALLERGIES:  is allergic to lisinopril.  MEDICATIONS:  Current Outpatient Medications  Medication Sig Dispense Refill   Accu-Chek Softclix Lancets lancets Use to check blood sugar once daily. 100 each 6   acetaminophen (TYLENOL) 500 MG tablet Take 2 tablets (1,000 mg total) by mouth every 8 (eight) hours as needed for moderate pain. 100 tablet 1   albuterol (VENTOLIN HFA) 108 (90 Base) MCG/ACT inhaler INHALE 2 PUFFS INTO THE LUNGS EVERY 6 (SIX) HOURS AS NEEDED FOR WHEEZING OR SHORTNESS OF BREATH. (Patient taking differently: Inhale 2 puffs into the lungs daily as needed for wheezing or shortness of breath.) 54 g 0   aspirin EC 81 MG tablet Take 1 tablet (81 mg total) by mouth daily. Swallow whole. 150 tablet 2   atorvastatin (LIPITOR) 80 MG tablet Take 1 tablet (80 mg total) by mouth daily. 90 tablet 1   Blood Glucose Monitoring Suppl (ACCU-CHEK GUIDE) w/Device KIT Use to check blood sugar once daily. 1 kit 0   Budeson-Glycopyrrol-Formoterol (BREZTRI AEROSPHERE) 160-9-4.8 MCG/ACT AERO Take 2 puffs first thing in morning and then another 2 puffs about 12 hours later. (Patient taking differently: Inhale 2 puffs into the lungs in the morning and at bedtime.) 10.7 g 6   DULoxetine (CYMBALTA) 60 MG capsule Take 1 capsule (60 mg total) by mouth daily. For chronic leg pains 30 capsule 3   ergocalciferol (DRISDOL) 1.25 MG (50000 UT) capsule Take 1 capsule (50,000 Units total) by mouth once a week. 12 capsule 1   ezetimibe (ZETIA) 10 MG tablet Take 1 tablet (10 mg total) by mouth daily. 90 tablet 3   ferrous sulfate 325 (65 FE) MG tablet Take 1 tablet (325 mg total) by mouth daily with breakfast. (Patient taking differently: Take 325 mg by mouth every evening.) 60 tablet 1   folic acid (FOLVITE) 1 MG tablet Take 1 tablet (1 mg total) by mouth daily. 90  tablet 0   furosemide (LASIX) 40 MG tablet Take 1-2 tablets (40-80 mg total) by mouth 2 (two) times daily as needed.Take an extra pill with worsening pedal edema. 270 tablet 1   gabapentin (NEURONTIN) 300 MG capsule Take 1 capsule (300 mg total) by mouth 2 (two)  times daily. 90 capsule 1   glucose blood (ACCU-CHEK GUIDE) test strip Use to check blood sugar once daily. 100 each 6   hydrALAZINE (APRESOLINE) 50 MG tablet Take 1 tablet (50 mg total) by mouth every 8 (eight) hours. 90 tablet 0   isosorbide mononitrate (IMDUR) 30 MG 24 hr tablet Take 1 tablet (30 mg total) by mouth daily. 90 tablet 1   metoprolol succinate (TOPROL-XL) 50 MG 24 hr tablet Take 1 tablet (50 mg total) by mouth daily. Take with or immediately following a meal. 90 tablet 1   OXYGEN Inhale 2 L/min into the lungs continuous.     pantoprazole (PROTONIX) 40 MG tablet Take 1 tablet (40 mg total) by mouth daily. 90 tablet 1   rivaroxaban (XARELTO) 20 MG TABS tablet Take 1 tablet (20 mg total) by mouth daily with supper. 90 tablet 1   nitroGLYCERIN (NITROSTAT) 0.4 MG SL tablet Place 1 tablet (0.4 mg total) under the tongue every 5 (five) minutes as needed for chest pain. (Patient not taking: Reported on 12/04/2022)     No current facility-administered medications for this visit.    REVIEW OF SYSTEMS:   Constitutional: Denies fevers, chills or abnormal night sweats  Eyes: Denies blurriness of vision, double vision or watery eyes Ears, nose, mouth, throat, and face: Denies mucositis or sore throat Respiratory: Denies cough or wheezes (+) COPD on oxygen Cardiovascular: Denies palpitation, chest discomfort or lower extremity swelling (+) Afib Gastrointestinal:  Denies nausea, heartburn or change in bowel habits (+) h/o PUD (+) dark stools from oral iron Skin: Denies abnormal skin rashes Lymphatics: Denies new lymphadenopathy or easy bruising Neurological:Denies numbness, tingling or new weaknesses (+) weak Behavioral/Psych: [name] is  stable, no new changes  All other systems were reviewed with the patient and are negative.  PHYSICAL EXAMINATION: ECOG PERFORMANCE STATUS: 1 - Symptomatic but completely ambulatory  Vitals:   01/28/23 1209  BP: (!) 104/58  Pulse: (!) 102  Resp: 18  Temp: 99.9 F (37.7 C)  SpO2: 98%   Filed Weights   01/28/23 1209  Weight: 216 lb (98 kg)    GENERAL:alert, no distress and comfortable SKIN: no rashes or significant lesions EYES: sclera clear NECK: without mass LYMPH:  no palpable cervical or supraclavicular lymphadenopathy LUNGS: decreased throughout, normal breathing effort HEART: Afib, rate controlled; no lower extremity edema ABDOMEN:abdomen soft, non-tender and normal bowel sounds Musculoskeletal:no cyanosis of digits and no clubbing  PSYCH: alert & oriented x 3 with fluent speech NEURO: no focal motor/sensory deficits  LABORATORY DATA:  I have reviewed the data as listed    Latest Ref Rng & Units 01/28/2023    1:35 PM 01/09/2023   10:29 AM 12/28/2022    3:09 AM  CBC  WBC 4.0 - 10.5 K/uL 8.8  9.4  12.7   Hemoglobin 13.0 - 17.0 g/dL 16.1  9.8  7.9   Hematocrit 39.0 - 52.0 % 36.2  33.9  28.2   Platelets 150 - 400 K/uL 306  312  391        Latest Ref Rng & Units 12/28/2022    3:09 AM 12/27/2022    9:16 AM 12/26/2022   11:45 PM  CMP  Glucose 70 - 99 mg/dL 096  045  409   BUN 8 - 23 mg/dL 15  16  15    Creatinine 0.61 - 1.24 mg/dL 8.11  9.14  7.82   Sodium 135 - 145 mmol/L 138  139  136   Potassium 3.5 -  5.1 mmol/L 3.7  4.1  4.3   Chloride 98 - 111 mmol/L 98  100  97   CO2 22 - 32 mmol/L 31   29   Calcium 8.9 - 10.3 mg/dL 8.7   8.8      RADIOGRAPHIC STUDIES: I have personally reviewed the radiological images as listed and agreed with the findings in the report. CT CHEST LCS NODULE F/U LOW DOSE WO CONTRAST  Result Date: 01/17/2023 CLINICAL DATA:  Current smoker with 36 pack-year history EXAM: CT CHEST WITHOUT CONTRAST FOR LUNG CANCER SCREENING NODULE FOLLOW-UP  TECHNIQUE: Multidetector CT imaging of the chest was performed following the standard protocol without IV contrast. RADIATION DOSE REDUCTION: This exam was performed according to the departmental dose-optimization program which includes automated exposure control, adjustment of the mA and/or kV according to patient size and/or use of iterative reconstruction technique. COMPARISON:  Lung cancer screening CT dated July 12, 2022 FINDINGS: Cardiovascular: Mild cardiomegaly. No pericardial effusion. Normal caliber thoracic aorta with moderate calcified plaque. Severe coronary artery calcifications. Mediastinum/Nodes: Esophagus and thyroid are unremarkable. No enlarged lymph nodes seen in the chest. Lungs/Pleura: Central airways are patent. Moderate centrilobular emphysema. No consolidation, pleural effusion or pneumothorax. Solid pulmonary nodules which which were new on prior exam have resolved. Other previously seen pulmonary nodules are stable. Upper Abdomen: Nonobstructing stone of the left kidney. No acute abnormality. Musculoskeletal: No chest wall mass or suspicious bone lesions identified. IMPRESSION: 1. Lung-RADS 2, benign appearance or behavior. Continue annual screening with low-dose chest CT without contrast in 12 months. 2. Coronary artery calcifications, aortic Atherosclerosis (ICD10-I70.0) and Emphysema (ICD10-J43.9). Electronically Signed   By: Allegra Lai M.D.   On: 01/17/2023 12:50   VAS [place] LOWER EXTREMITY ARTERIAL DUPLEX  Result Date: 01/16/2023 LOWER EXTREMITY ARTERIAL DUPLEX STUDY Patient Name:  Jared [name]  Date of Exam:   01/15/2023 Medical Rec #: [id number]     Accession #:    [id number] Date of Birth: 03-Dec-1955      Patient Gender: M Patient Age:   38 years Exam Location:  [facility] Procedure:      VAS [place] LOWER EXTREMITY ARTERIAL DUPLEX Referring Phys: Heath Lark --------------------------------------------------------------------------------  Indications:  Claudication, rest pain, and peripheral artery disease. High Risk Factors: Hypertension, coronary artery disease, prior CVA.  Vascular Interventions: PROCEDURE:                         1) Ultrasound guided right common femoral artery access                         2) Aortogram                         3) Left lower extremity angiogram with third order                         cannulation ( total contrast)                         4) Additional left lower extremity angiogram with third                         order cannulation.                         5) Ultrasound  guided left posterior tibial artery access                         6) Left femoropopliteal angioplasty and stenting                         (6x175mm Eluvia x4; 6x69mm Eluvia)                         7) Conscious sedation (94 minutes). Current ABI:            R 0.88 L 0.96 Performing Technologist: [place] Ponder RVS  Examination Guidelines: A complete evaluation includes B-mode imaging, spectral Doppler, color Doppler, and power Doppler as needed of all accessible portions of each vessel. Bilateral testing is considered an integral part of a complete examination. Limited examinations for reoccurring indications may be performed as noted.   +-----------+--------+-----+---------------+---------+--------+ LEFT       PSV cm/sRatioStenosis       Waveform Comments +-----------+--------+-----+---------------+---------+--------+ CFA Prox   80                          biphasic          +-----------+--------+-----+---------------+---------+--------+ DFA        277          50-74% stenosistriphasic         +-----------+--------+-----+---------------+---------+--------+ POP Prox   141                         biphasic          +-----------+--------+-----+---------------+---------+--------+ TP Trunk   59                          biphasic          +-----------+--------+-----+---------------+---------+--------+ ATA Distal 40                           biphasic          +-----------+--------+-----+---------------+---------+--------+ PTA Distal 53                          biphasic          +-----------+--------+-----+---------------+---------+--------+ PERO Distal29                          biphasic          +-----------+--------+-----+---------------+---------+--------+  Left Stent(s): +---------------+--------+--------+--------+--------+ femoropoplitealPSV cm/sStenosisWaveformComments +---------------+--------+--------+--------+--------+ Prox to Stent  87              biphasic         +---------------+--------+--------+--------+--------+ Proximal Stent 86              biphasic         +---------------+--------+--------+--------+--------+ Mid Stent      87              biphasic         +---------------+--------+--------+--------+--------+ Distal Stent   68              biphasic         +---------------+--------+--------+--------+--------+ Distal to Stent60              biphasic         +---------------+--------+--------+--------+--------+  Summary: Left: 50-74% stenosis noted in the deep femoral artery. Patent stent without evidence of stenosis.  See table(s) above for measurements and observations. Electronically signed by Heath Lark on 01/16/2023 at 4:06:02 PM.    Final    VAS [place] ABI WITH/WO TBI  Result Date: 01/16/2023  LOWER EXTREMITY DOPPLER STUDY Patient Name:  Jared [name]  Date of Exam:   01/15/2023 Medical Rec #: [id number]     Accession #:    [id number] Date of Birth: June 24, 1955      Patient Gender: M Patient Age:   19 years Exam Location:  [facility] Procedure:      VAS [place] ABI WITH/WO TBI Referring Phys: Heath Lark --------------------------------------------------------------------------------  Indications: Claudication, rest pain, and peripheral artery disease. High Risk Factors: Hypertension, coronary artery disease, prior CVA.  Vascular Interventions:  PROCEDURE:                         1) Ultrasound guided right common femoral artery access                         2) Aortogram                         3) Left lower extremity angiogram with third order                         cannulation ( total contrast)                         4) Additional left lower extremity angiogram with third                         order cannulation.                         5) Ultrasound guided left posterior tibial artery access                         6) Left femoropopliteal angioplasty and stenting                         (6x128mm Eluvia x4; 6x64mm Eluvia)                         7) Conscious sedation (94 minutes). Comparison Study: 11/01/22 prior Performing Technologist: [place] Ponder [employer]  Examination Guidelines: A complete evaluation includes at minimum, Doppler waveform signals and systolic blood pressure reading at the level of bilateral brachial, anterior tibial, and posterior tibial arteries, when vessel segments are accessible. Bilateral testing is considered an integral part of a complete examination. Photoelectric Plethysmograph (PPG) waveforms and toe systolic pressure readings are included as required and additional duplex testing as needed. Limited examinations for reoccurring indications may be performed as noted.  ABI Findings: +---------+------------------+-----+--------+--------+ Right    Rt Pressure (mmHg)IndexWaveformComment  +---------+------------------+-----+--------+--------+ Brachial 122                                     +---------+------------------+-----+--------+--------+ PTA      92                0.75 biphasic         +---------+------------------+-----+--------+--------+  DP       107               0.88 biphasic         +---------+------------------+-----+--------+--------+ Great Toe101               0.83 Normal           +---------+------------------+-----+--------+--------+  +---------+------------------+-----+-----------+-------+ Left     Lt Pressure (mmHg)IndexWaveform   Comment +---------+------------------+-----+-----------+-------+ Brachial 122                                       +---------+------------------+-----+-----------+-------+ PTA      111               0.91 multiphasic        +---------+------------------+-----+-----------+-------+ DP       117               0.96 multiphasic        +---------+------------------+-----+-----------+-------+ Great Toe141               1.16 Normal             +---------+------------------+-----+-----------+-------+ +-------+-----------+-----------+------------+------------+ ABI/TBIToday's ABIToday's TBIPrevious ABIPrevious TBI +-------+-----------+-----------+------------+------------+ Right  0.88       0.83       0.73        0.58         +-------+-----------+-----------+------------+------------+ Left   0.96       1.16       0.56        0            +-------+-----------+-----------+------------+------------+ Bilateral ABIs appear increased compared to prior study on 11/01/22.  Summary: Right: Resting right ankle-brachial index indicates mild right lower extremity arterial disease. The right toe-brachial index is normal. Left: Resting left ankle-brachial index is within normal range. The left toe-brachial index is normal. *See table(s) above for measurements and observations.  Electronically signed by Heath Lark on 01/16/2023 at 4:05:50 PM.    Final     ASSESSMENT & PLAN: 67 yo male   Acute on chronic anemia, iron deficiency, secondary to h/o GI bleed, PUD, and inadequate iron replacement I reviewed his medical record in detail with the patient.  -First found to have iron deficiency 09/05/2018 with serum iron 9, 3% saturation, TIBC 358, and ferritin of 3 with a hemoglobin of 4.9, s/p blood transfusion  -EGD showed esophagitis, colonoscopy and small bowel endoscopy were unremarkable.  Anemia improved -Developed acute on chronic anemia 10/2018, small bowel endoscopy  -Began receiving IV iron at that time (Feraheme 510 mg x1 episodically). -Capsule endoscopy 06/18/19 showed gastritis, no AVMs or bleeding.  -He saw Dr. Clelia Croft 07/21/19 and 09/17/19 with resolution of IDA, he stopped oral iron  -Recurrent IDA in 4/22 ferritin 7, repeat small bowel endoscopy 10/09/20 was normal, but procedure in 11/12/20 by Dr. Meridee Score showed gastritis and 2 gastric ulcers.  -It was felt IDA to be secondary to intermittent mucosal oozing in a patient on chronic anticoagulation therapy and also inadequate iron replacement. We recommend indefinite iron replacement with oral iron 1-2 times daily and IV iron PRN -Most hgb 5.9 on 12/26/2022, he was admitted.  Iron panel showed ferritin 3, iron 5, 1% saturation, and TIBC 441.  Endoscopy showed no bleeding.  He was transfused and received Ferrlecit 250 mg x1 on 12/27/22. Discharged 7/19 and increased oral iron to BID -Mr. Besecker appears stable.  He has mild weakness, but feels better since hospital d/c. Tolerating oral iron BID. I recommend to separate from PPI and take with vit C to promote absorption -Labs reviewed, Hgb 10.6, serum iron 29, 8% sat, TIBC 367, and ferritin 10. He is still iron deficient, we recommend IV Venofer then close monitoring -We reviewed potential risk/benefit of IV iron, he has had it before, including allergy/anaphylaxis. He agrees to proceed -His anemia normalizes at times, this is likely not primary bone marrow conditions. Although he understands if anemia does not resolve with sufficient iron replacement, we may recommend additional work up. He also likely has component of anemia of chronic disease -Will check lab monthly, arrange IV iron PRN (goal Ferritin >30), and see him back in 6 months -Pt seen with Dr. Mosetta Putt  Afib, PVD, HL, DM, COPD -On home O2 2-3 L -S/p left femoropopliteal angioplasty and stenting (6x117mm Eluvia x4; 6x42mm Eluvia)  on 12/07/22 for ischemic rest pain. Followed by Dr. Lenell Antu -On Xarelto and ASA 81 mg, we reviewed this increases bleeding risk and will need close monitoring    PLAN: -Medical record, multiple endoscopies, and today's labs reviewed -Ferritin 10, Hgb 10.6 - continue oral iron BID with vit C and separate from PPI -Venofer 300 mg weekly x3 -Lab monthly, with additional IV iron PRN to keep ferritin > 30 -F/up in 6 months, or sooner if needed -Pt seen with Dr. Mosetta Putt   Orders Placed This Encounter  Procedures   CBC with Differential (Cancer Center Only)    Standing Status:   Standing    Number of Occurrences:   50    Standing Expiration Date:   01/28/2024   Ferritin    Standing Status:   Standing    Number of Occurrences:   50    Standing Expiration Date:   01/28/2024   Iron and Iron Binding Capacity ([facility] only)    Standing Status:   Standing    Number of Occurrences:   50    Standing Expiration Date:   01/28/2024      All questions were answered. The patient knows to call the clinic with any problems, questions or concerns.      Jared Samples, NP 01/29/23   Addendum  I have seen the patient, examined him. I agree with the assessment and and plan and have edited the notes.   67 yo male with multiple medical comorbidities, including atrial fibrillation on anticoagulation, presented with recurrent iron deficient anemia, required blood transfusion and IV iron.  He did respond well to IV iron.  Previous multiple EGD showed AVM and ulcers.  I reviewed his recent labs, and recommend more IV iron to adequately replace his iron to improve his anemia.  I do not think he needs additional anemia workup at this point.  Will follow-up closely, will try to keep his ferritin above 30 due to his significant anemia.  He previously tolerated IV iron well, and agrees to receive IV iron in our office.  Potential side effect discussed with him, especially allergy reaction and anaphylactic reaction,  with different iron products.  He voiced a good understanding, all questions were answered.  Jared Mood MD 01/28/2023

## 2023-01-29 ENCOUNTER — Encounter: Payer: Self-pay | Admitting: Nurse Practitioner

## 2023-01-29 ENCOUNTER — Ambulatory Visit: Payer: Medicare HMO

## 2023-01-29 ENCOUNTER — Ambulatory Visit (INDEPENDENT_AMBULATORY_CARE_PROVIDER_SITE_OTHER): Payer: Medicare HMO | Admitting: Podiatry

## 2023-01-29 ENCOUNTER — Encounter: Payer: Self-pay | Admitting: Podiatry

## 2023-01-29 VITALS — BP 119/71

## 2023-01-29 DIAGNOSIS — E1142 Type 2 diabetes mellitus with diabetic polyneuropathy: Secondary | ICD-10-CM | POA: Diagnosis not present

## 2023-01-29 DIAGNOSIS — M79674 Pain in right toe(s): Secondary | ICD-10-CM | POA: Diagnosis not present

## 2023-01-29 DIAGNOSIS — B351 Tinea unguium: Secondary | ICD-10-CM | POA: Diagnosis not present

## 2023-01-29 DIAGNOSIS — M79675 Pain in left toe(s): Secondary | ICD-10-CM

## 2023-01-29 DIAGNOSIS — L84 Corns and callosities: Secondary | ICD-10-CM | POA: Diagnosis not present

## 2023-01-29 NOTE — Progress Notes (Signed)
Subjective:  Patient ID: Jared [name], male    DOB: [birth year],   MRN: [id number]  Chief Complaint  Patient presents with   Foot Pain   Callouses    B/L feet and heels   Diabetes    [facility]    67 y.o. male presents for concern of thickened elongated and painful nails that are difficult to trim. Requesting to have them trimmed today. Also has some calluses that cause some trouble. Relates burning and tingling in their feet. Patient is diabetic and last A1c was  Lab Results  Component Value Date   HGBA1C 5.0 10/25/2022   . Does have a history of lower back pain that could be causing some of the nerve pain.   PCP:  [name], MD    . Denies any other pedal complaints. Denies n/v/f/c.   Past Medical History:  Diagnosis Date   AVM (arteriovenous malformation)    CAD (coronary artery disease)    a. LHC 5/12:  LAD 20, pLCx 20, pRCA 40, dRCA 40, EF 35%, diff HK  //  b. Myoview 4/16: Overall Impression:  High risk stress nuclear study There is no evidence of ischemia.  There is severe LV dysfunction. LV Ejection Fraction: 30%.  LV Wall Motion:  There is global LV hypokinesis.     CAP (community acquired pneumonia) 09/2013   Chronic combined systolic and diastolic CHF (congestive heart failure) (HCC)    a. Echo 4/16:Mild LVH, EF 40-45%, diffuse HK //  b. Echo 8/17: EF 35-40%, diffuse HK, diastolic dysfunction, aortic sclerosis, trivial MR, moderate LAE, normal RVSF, moderate RAE, mild TR, PASP 42 mmHg // c. Echo 4/18: Mild concentric LVH, EF 30-35, normal wall motion, grade 1 diastolic dysfunction, PASP 49   Chronic respiratory failure (HCC)    Cluster headache    "hx; haven't had one in awhile" (01/09/2016)   COPD (chronic obstructive pulmonary disease) (HCC)    [name] 01/09/2016   DM (diabetes mellitus) (HCC)    History of CVA (cerebrovascular accident)    Hypertension    IDA (iron deficiency anemia)    Moderate tobacco use disorder    NICM (nonischemic cardiomyopathy) (HCC)     Nicotine addiction    Tobacco abuse    Type 2 diabetes mellitus (HCC) 05/14/2016    Objective:  Physical Exam: Vascular: DP/PT pulses 2/4 bilateral. CFT <3 seconds. Absent hair growth on digits. Edema noted to bilateral lower extremities. Xerosis noted bilaterally.  Skin. No lacerations or abrasions bilateral feet. Nails 1-5 bilateral  are thickened discolored and elongated with subungual debris. Hyperkeratotic lesion noted to plantar left fifth metatarsal head.  Musculoskeletal: MMT 5/5 bilateral lower extremities in DF, PF, Inversion and Eversion. Deceased ROM in DF of ankle joint.  Neurological: Sensation intact to light touch. Protective sensation diminished bilateral.    Assessment:   1. Pain due to onychomycosis of toenails of both feet   2. Callus   3. Type 2 diabetes mellitus with diabetic polyneuropathy, without long-term current use of insulin (HCC)      Plan:  Patient was evaluated and treated and all questions answered. -Discussed and educated patient on diabetic foot care, especially with  regards to the vascular, neurological and musculoskeletal systems.  -Stressed the importance of good glycemic control and the detriment of not  controlling glucose levels in relation to the foot. -Discussed supportive shoes at all times and checking feet regularly.  -Mechanically debrided all nails 1-5 bilateral using sterile nail nipper and filed with dremel without incident -  Hyperkeratotic tissue debrided with chisel without incident.   -Discussed checking with PCP to see if he can increase his dosage of gabapentin.  -Answered all patient questions -Patient to return  in 3 months for at risk foot care -Patient advised to call the office if any problems or questions arise in the meantime.   Louann Sjogren, DPM

## 2023-01-30 ENCOUNTER — Other Ambulatory Visit: Payer: Self-pay

## 2023-01-30 ENCOUNTER — Encounter: Payer: Self-pay | Admitting: Nurse Practitioner

## 2023-01-30 ENCOUNTER — Other Ambulatory Visit (HOSPITAL_COMMUNITY): Payer: Self-pay

## 2023-01-30 ENCOUNTER — Other Ambulatory Visit: Payer: Self-pay | Admitting: Family Medicine

## 2023-01-30 ENCOUNTER — Other Ambulatory Visit: Payer: Self-pay | Admitting: Gastroenterology

## 2023-01-30 ENCOUNTER — Other Ambulatory Visit: Payer: Self-pay | Admitting: Internal Medicine

## 2023-01-30 DIAGNOSIS — I110 Hypertensive heart disease with heart failure: Secondary | ICD-10-CM

## 2023-01-30 MED ORDER — FERROUS SULFATE 325 (65 FE) MG PO TABS
325.0000 mg | ORAL_TABLET | Freq: Every day | ORAL | 3 refills | Status: DC
  Filled 2023-01-30: qty 60, 60d supply, fill #0
  Filled 2023-02-24 – 2023-04-09 (×2): qty 60, 60d supply, fill #1

## 2023-01-30 MED ORDER — ISOSORBIDE MONONITRATE ER 30 MG PO TB24
30.0000 mg | ORAL_TABLET | Freq: Every day | ORAL | 1 refills | Status: DC
Start: 2023-01-30 — End: 2023-06-10
  Filled 2023-01-30 – 2023-02-24 (×2): qty 90, 90d supply, fill #0
  Filled 2023-06-06 (×2): qty 90, 90d supply, fill #1

## 2023-01-30 MED ORDER — FOLIC ACID 1 MG PO TABS
1.0000 mg | ORAL_TABLET | Freq: Every day | ORAL | 1 refills | Status: DC
Start: 2023-01-30 — End: 2023-09-09
  Filled 2023-01-30 – 2023-06-06 (×3): qty 90, 90d supply, fill #0
  Filled 2023-08-27: qty 90, 90d supply, fill #1

## 2023-01-30 MED ORDER — ACCU-CHEK GUIDE W/DEVICE KIT
PACK | 0 refills | Status: DC
  Filled 2023-01-30: qty 1, 30d supply, fill #0
  Filled 2023-02-24: qty 1, fill #0

## 2023-01-31 ENCOUNTER — Other Ambulatory Visit: Payer: Self-pay

## 2023-01-31 ENCOUNTER — Other Ambulatory Visit: Payer: Self-pay | Admitting: Nurse Practitioner

## 2023-01-31 DIAGNOSIS — D500 Iron deficiency anemia secondary to blood loss (chronic): Secondary | ICD-10-CM

## 2023-01-31 MED ORDER — EZETIMIBE 10 MG PO TABS
10.0000 mg | ORAL_TABLET | Freq: Every day | ORAL | 0 refills | Status: DC
  Filled 2023-01-31 – 2023-02-24 (×2): qty 90, 90d supply, fill #0

## 2023-02-01 ENCOUNTER — Other Ambulatory Visit: Payer: Self-pay

## 2023-02-01 DIAGNOSIS — J9612 Chronic respiratory failure with hypercapnia: Secondary | ICD-10-CM | POA: Diagnosis not present

## 2023-02-01 DIAGNOSIS — E1151 Type 2 diabetes mellitus with diabetic peripheral angiopathy without gangrene: Secondary | ICD-10-CM | POA: Diagnosis not present

## 2023-02-01 DIAGNOSIS — D509 Iron deficiency anemia, unspecified: Secondary | ICD-10-CM | POA: Diagnosis not present

## 2023-02-01 DIAGNOSIS — I4891 Unspecified atrial fibrillation: Secondary | ICD-10-CM | POA: Diagnosis not present

## 2023-02-01 DIAGNOSIS — I110 Hypertensive heart disease with heart failure: Secondary | ICD-10-CM | POA: Diagnosis not present

## 2023-02-01 DIAGNOSIS — J9611 Chronic respiratory failure with hypoxia: Secondary | ICD-10-CM | POA: Diagnosis not present

## 2023-02-01 DIAGNOSIS — J449 Chronic obstructive pulmonary disease, unspecified: Secondary | ICD-10-CM | POA: Diagnosis not present

## 2023-02-01 DIAGNOSIS — I5042 Chronic combined systolic (congestive) and diastolic (congestive) heart failure: Secondary | ICD-10-CM | POA: Diagnosis not present

## 2023-02-01 DIAGNOSIS — I4892 Unspecified atrial flutter: Secondary | ICD-10-CM | POA: Diagnosis not present

## 2023-02-04 ENCOUNTER — Ambulatory Visit: Payer: Medicare HMO | Attending: Family Medicine | Admitting: Family Medicine

## 2023-02-04 ENCOUNTER — Other Ambulatory Visit: Payer: Self-pay

## 2023-02-04 ENCOUNTER — Telehealth: Payer: Self-pay

## 2023-02-04 ENCOUNTER — Encounter: Payer: Self-pay | Admitting: Family Medicine

## 2023-02-04 VITALS — BP 129/65 | HR 105 | Ht 74.5 in | Wt 216.2 lb

## 2023-02-04 DIAGNOSIS — I4892 Unspecified atrial flutter: Secondary | ICD-10-CM

## 2023-02-04 DIAGNOSIS — I5042 Chronic combined systolic (congestive) and diastolic (congestive) heart failure: Secondary | ICD-10-CM | POA: Diagnosis not present

## 2023-02-04 DIAGNOSIS — G8929 Other chronic pain: Secondary | ICD-10-CM | POA: Diagnosis not present

## 2023-02-04 DIAGNOSIS — I110 Hypertensive heart disease with heart failure: Secondary | ICD-10-CM | POA: Diagnosis not present

## 2023-02-04 DIAGNOSIS — M25562 Pain in left knee: Secondary | ICD-10-CM | POA: Diagnosis not present

## 2023-02-04 DIAGNOSIS — D500 Iron deficiency anemia secondary to blood loss (chronic): Secondary | ICD-10-CM

## 2023-02-04 DIAGNOSIS — K250 Acute gastric ulcer with hemorrhage: Secondary | ICD-10-CM

## 2023-02-04 DIAGNOSIS — I739 Peripheral vascular disease, unspecified: Secondary | ICD-10-CM

## 2023-02-04 MED ORDER — ATORVASTATIN CALCIUM 80 MG PO TABS
80.0000 mg | ORAL_TABLET | Freq: Every day | ORAL | 1 refills | Status: DC
Start: 2023-02-04 — End: 2023-09-09
  Filled 2023-02-04 – 2023-06-06 (×3): qty 90, 90d supply, fill #0
  Filled 2023-08-27: qty 90, 90d supply, fill #1

## 2023-02-04 MED ORDER — METOPROLOL SUCCINATE ER 50 MG PO TB24
50.0000 mg | ORAL_TABLET | Freq: Every day | ORAL | 1 refills | Status: DC
Start: 2023-02-04 — End: 2023-05-21
  Filled 2023-02-04 – 2023-02-24 (×2): qty 90, 90d supply, fill #0

## 2023-02-04 MED ORDER — PANTOPRAZOLE SODIUM 40 MG PO TBEC
40.0000 mg | DELAYED_RELEASE_TABLET | Freq: Every day | ORAL | 1 refills | Status: DC
Start: 2023-02-04 — End: 2023-05-21
  Filled 2023-02-04: qty 90, 90d supply, fill #0
  Filled 2023-02-24: qty 90, 90d supply, fill #1

## 2023-02-04 MED ORDER — HYDRALAZINE HCL 50 MG PO TABS
50.0000 mg | ORAL_TABLET | Freq: Three times a day (TID) | ORAL | 1 refills | Status: DC
Start: 2023-02-04 — End: 2023-11-20
  Filled 2023-02-04 – 2023-02-24 (×2): qty 270, 90d supply, fill #0
  Filled 2023-06-06 (×2): qty 270, 90d supply, fill #1

## 2023-02-04 NOTE — Patient Instructions (Signed)
 Peripheral Vascular Disease  Peripheral vascular disease (PVD) is a disease of the blood vessels. PVD may also be called peripheral artery disease (PAD) or poor circulation. PVD is the blocking or hardening of the arteries anywhere within the circulatory system beyond the heart. This can result in a decreased supply of blood to the arms, legs, and internal organs, such as the stomach or kidneys. However, PVD most often affects a person's lower legs and feet. Without treatment, PVD often worsens. PVD can lead to acute limb ischemia. This occurs when an arm or leg suddenly has trouble getting enough blood. This is a medical emergency. What are the causes? The most common cause of PVD is atherosclerosis. This is a buildup of fatty material and other substances (plaque)inside your arteries. Pieces of plaque can break off from the walls of an artery and become stuck in a smaller artery, blocking blood flow and possibly causing acute limb ischemia. Other common causes of PVD include: Blood clots that form inside the blood vessels. Injuries to blood vessels. Diseases that cause inflammation of blood vessels or cause blood vessel tightening (spasms). What increases the risk? The following factors may make you more likely to develop this condition: A family history of PVD. Common medical conditions, including: High cholesterol. Diabetes. High blood pressure (hypertension). Heart disease. Known atherosclerotic disease in another area of the body. Past injury, such as burns or a broken bone. Other medical conditions, such as: Buerger's disease. This is caused by inflamed blood vessels in your hands and feet. Some forms of arthritis. Birth defects that affect the arteries in your legs. Kidney disease. Using tobacco and nicotine products. Not getting enough exercise. Obesity. Being age 71 or older, or being age 81 or older and having the other risk factors. What are the signs or symptoms? This  condition may cause different symptoms. Your symptoms depend on what body part is not getting enough blood. Common signs and symptoms include: Cramps in your buttocks, legs, and feet. Intermittent claudication. This is pain and weakness in your legs during activity that resolves with rest. Leg pain at rest and leg numbness, tingling, or weakness. Coldness in a leg or foot, especially when compared to the other leg or foot. Skin or hair changes. These can include: Hair loss. Shiny skin. Pale or bluish skin. Thick toenails. Inability to get or maintain an erection (erectile dysfunction). Tiredness (fatigue). Weak pulse or no pulse in the feet. People with PVD are more likely to develop open wounds (ulcers) and sores on their toes, feet, or legs. The ulcers or sores may take longer than normal to heal. How is this diagnosed? PVD is diagnosed based on your signs and symptoms, a physical exam, and your medical history. You may also have other tests to find the cause. Tests include: Ankle-brachial index test.This test compares the blood pressure readings of the legs and arms. This may also include an exercise ankle-brachial index test in which you walk on a treadmill to check your symptoms. Doppler ultrasound. This takes pictures of blood flow through your blood vessels. Imaging studies that use dye to show blood flow. These are: CT angiogram. Magnetic resonance angiogram, or MRA. How is this treated? Treatment for PVD depends on the cause of your condition, how severe your symptoms are, and your age. Underlying causes need to be treated and controlled. These include long-term (chronic) conditions, such as diabetes, high cholesterol, and hypertension. Treatment may include: Lifestyle changes, such as: Quitting tobacco use. Exercising regularly. Following a  low-fat, low-cholesterol diet. Not drinking alcohol. Taking medicines, such as: Blood thinners to prevent blood clots. Medicines to  improve blood flow. Medicines to improve cholesterol levels. Procedures, such as: Angioplasty. This uses an inflated balloon to open a blocked artery and improve blood flow. Stent implant. This inserts a small mesh tube to keep a blocked artery open. Peripheral bypass surgery. This reroutes blood flow around a blocked artery. Surgery to remove dead tissue from an infected wound (debridement). Amputation. This is surgical removal of the affected limb. It may be necessary in cases of acute limb ischemia when medical or surgical treatments have not helped. Follow these instructions at home: Medicines Take over-the-counter and prescription medicines only as told by your health care provider. If you are taking blood thinners: Talk with your health care provider before you take any medicines that contain aspirin or NSAIDs, such as ibuprofen. These medicines increase your risk for dangerous bleeding. Take your medicine exactly as told, at the same time every day. Avoid activities that could cause injury or bruising, and follow instructions about how to prevent falls. Wear a medical alert bracelet or carry a card that lists what medicines you take. Lifestyle     Exercise regularly. Ask your health care provider about some good activities for you. Talk with your health care provider about maintaining a healthy weight. If needed, ask about losing weight. Eat a diet that is low in fat and cholesterol. If you need help, talk with your health care provider. Do not drink alcohol. Do not use any products that contain nicotine or tobacco. These products include cigarettes, chewing tobacco, and vaping devices, such as e-cigarettes. If you need help quitting, ask your health care provider. General instructions Take good care of your feet. To do this: Wear comfortable shoes that fit well. Check your feet often for any cuts or sores. Get an annual influenza vaccine. Keep all follow-up visits. This is  important. Where to find more information Society for Vascular Surgery: vascular.org American Heart Association: heart.org National Heart, Lung, and Blood Institute: [employer] Contact a health care provider if: You have leg cramps while walking. You have leg pain when you rest. Your leg or foot feels cold. Your skin changes color. You have erectile dysfunction. You have cuts or sores on your legs or feet that do not heal. Get help right away if: You have sudden changes in color and feeling of your arms or legs, such as: Your arm or leg turns cold, numb, and blue. Your arm or leg becomes red, warm, swollen, painful, or numb. You have any symptoms of a stroke. "BE FAST" is an easy way to remember the main warning signs of a stroke: B - Balance. Signs are dizziness, sudden trouble walking, or loss of balance. E - Eyes. Signs are trouble seeing or a sudden change in vision. F - Face. Signs are sudden weakness or numbness of the face, or the face or eyelid drooping on one side. A - Arms. Signs are weakness or numbness in an arm. This happens suddenly and usually on one side of the body. S - Speech. Signs are sudden trouble speaking, slurred speech, or trouble understanding what people say. T - Time. Time to call emergency services. Write down what time symptoms started. You have other signs of a stroke, such as: A sudden, severe headache with no known cause. Nausea or vomiting. Seizure. You have chest pain or trouble breathing. These symptoms may represent a serious problem that is an emergency.  Do not wait to see if the symptoms will go away. Get medical help right away. Call your local emergency services (911 in the [place] Do not drive yourself to the hospital. Summary Peripheral vascular disease (PVD) is a disease of the blood vessels. PVD is the blocking or hardening of the arteries anywhere within the circulatory system beyond the heart. PVD may cause different symptoms. Your  symptoms depend on what part of your body is not getting enough blood. Treatment for PVD depends on what caused it, how severe your symptoms are, and your age. This information is not intended to replace advice given to you by your health care provider. Make sure you discuss any questions you have with your health care provider. Document Revised: 11/03/2019 Document Reviewed: 11/30/2019 Elsevier Patient Education  2024 [employer].

## 2023-02-04 NOTE — Progress Notes (Signed)
Subjective:  Patient ID: Jared [name], male    DOB: 23-Jan-1956  Age: 67 y.o. MRN: [id number]  CC: Medical Management of Chronic Issues   HPI Faraj Oborny is a 66 y.o. year old male with a history of hypertension, COPD, tobacco abuse, NICM , CHF (EF 45-50% from echo 12/2021), atrial flutter, chronic respiratory failure with hypoxia (currently on 2-3 L of oxygen), multiple hospitalizations for GI bleed with secondary anemia due to AV malformations, peptic ulcer disease, previous history of CVA, Type 2 DM, PAD status post left femoral-popliteal angioplasty and stenting (in 11/2022).   Interval History: Discussed the use of AI scribe software for clinical note transcription with the patient, who gave verbal consent to proceed.  He presents for a follow-up visit. He recently saw Hematologist who discussed his anemia and plans for monthly lab work and potential iron infusions.  He has not had any hematochezia or hematemesis and denies presence of fatigue.  The patient also saw his vascular doctor earlier this month for his peripheral vascular disease.  He has no claudication pain.  Plan is to follow-up with vascular in 6 months.  At his last visit Cymbalta was added to his regimen of gabapentin for leg pains thought to be secondary to his PVD and he has found this to be beneficial. He also reports a pain in the back of his left knee, which is being addressed with ongoing physical therapy.    The patient is also taking Furosemide for fluid retention and Xarelto for atrial fibrillation. He denies presence of palpitations, lightheadedness or chest pain. He does not see cardiology until 03/2023.  The patient is a current smoker, consuming about ten cigarettes a day. He has tried patches but found them unhelpful.       Past Medical History:  Diagnosis Date   AVM (arteriovenous malformation)    CAD (coronary artery disease)    a. LHC 5/12:  LAD 20, pLCx 20, pRCA 40, dRCA 40, EF 35%, diff HK  //  b.  Myoview 4/16: Overall Impression:  High risk stress nuclear study There is no evidence of ischemia.  There is severe LV dysfunction. LV Ejection Fraction: 30%.  LV Wall Motion:  There is global LV hypokinesis.     CAP (community acquired pneumonia) 09/2013   Chronic combined systolic and diastolic CHF (congestive heart failure) (HCC)    a. Echo 4/16:Mild LVH, EF 40-45%, diffuse HK //  b. Echo 8/17: EF 35-40%, diffuse HK, diastolic dysfunction, aortic sclerosis, trivial MR, moderate LAE, normal RVSF, moderate RAE, mild TR, PASP 42 mmHg // c. Echo 4/18: Mild concentric LVH, EF 30-35, normal wall motion, grade 1 diastolic dysfunction, PASP 49   Chronic respiratory failure (HCC)    Cluster headache    "hx; haven't had one in awhile" (01/09/2016)   COPD (chronic obstructive pulmonary disease) (HCC)    [name] 01/09/2016   DM (diabetes mellitus) (HCC)    History of CVA (cerebrovascular accident)    Hypertension    IDA (iron deficiency anemia)    Moderate tobacco use disorder    NICM (nonischemic cardiomyopathy) (HCC)    Nicotine addiction    Tobacco abuse    Type 2 diabetes mellitus (HCC) 05/14/2016    Past Surgical History:  Procedure Laterality Date   ABDOMINAL AORTOGRAM W/LOWER EXTREMITY N/A 12/07/2022   Procedure: ABDOMINAL AORTOGRAM W/LOWER EXTREMITY;  Surgeon: Leonie Douglas, MD;  [facility]: [facility];  Service: Cardiovascular;  Laterality: N/A;   BIOPSY  11/12/2020  Procedure: BIOPSY;  Surgeon: Lemar Lofty., MD;  Location: [facility] ENDOSCOPY;  Service: Gastroenterology;;   CARDIAC CATHETERIZATION  10/2010   LM normal, LAD with 20% irregularities, LCX with 20%, RCA with 40% prox and 40% distal - EF of 35%   CATARACT EXTRACTION, BILATERAL     COLONOSCOPY W/ BIOPSIES AND POLYPECTOMY     COLONOSCOPY WITH PROPOFOL N/A 09/06/2018   Procedure: COLONOSCOPY WITH PROPOFOL;  Surgeon: Tressia Danas, MD;  Location: [facility] ENDOSCOPY;  Service: Gastroenterology;  Laterality: N/A;    ENTEROSCOPY N/A 09/28/2018   Procedure: ENTEROSCOPY;  Surgeon: Jeani Hawking, MD;  Location: [facility] ENDOSCOPY;  Service: Endoscopy;  Laterality: N/A;   ENTEROSCOPY N/A 10/28/2018   Procedure: ENTEROSCOPY;  Surgeon: Tressia Danas, MD;  Location: [facility] ENDOSCOPY;  Service: Gastroenterology;  Laterality: N/A;   ENTEROSCOPY N/A 10/09/2020   Procedure: ENTEROSCOPY;  Surgeon: Hilarie Fredrickson, MD;  Location: [facility] ENDOSCOPY;  Service: Endoscopy;  Laterality: N/A;   ENTEROSCOPY N/A 03/22/2022   Procedure: ENTEROSCOPY;  Surgeon: Benancio Deeds, MD;  Location: [facility] ENDOSCOPY;  Service: Gastroenterology;  Laterality: N/A;   ESOPHAGOGASTRODUODENOSCOPY N/A 11/12/2020   Procedure: ESOPHAGOGASTRODUODENOSCOPY (EGD);  Surgeon: Lemar Lofty., MD;  Location: [facility] ENDOSCOPY;  Service: Gastroenterology;  Laterality: N/A;   ESOPHAGOGASTRODUODENOSCOPY (EGD) WITH PROPOFOL N/A 09/05/2018   Procedure: ESOPHAGOGASTRODUODENOSCOPY (EGD) WITH PROPOFOL;  Surgeon: Benancio Deeds, MD;  Location: [facility] ENDOSCOPY;  Service: Gastroenterology;  Laterality: N/A;   ESOPHAGOGASTRODUODENOSCOPY (EGD) WITH PROPOFOL N/A 11/19/2020   Procedure: ESOPHAGOGASTRODUODENOSCOPY (EGD) WITH PROPOFOL;  Surgeon: Beverley Fiedler, MD;  Location: [facility] ENDOSCOPY;  Service: Gastroenterology;  Laterality: N/A;   ESOPHAGOGASTRODUODENOSCOPY (EGD) WITH PROPOFOL N/A 12/27/2022   Procedure: ESOPHAGOGASTRODUODENOSCOPY (EGD) WITH PROPOFOL;  Surgeon: Napoleon Form, MD;  Location: [facility] ENDOSCOPY;  Service: Gastroenterology;  Laterality: N/A;   EXCISION MASS HEAD     GIVENS CAPSULE STUDY N/A 09/06/2018   Procedure: GIVENS CAPSULE STUDY;  Surgeon: Tressia Danas, MD;  Location: [facility] ENDOSCOPY;  Service: Gastroenterology;  Laterality: N/A;   GIVENS CAPSULE STUDY N/A 09/26/2018   Procedure: GIVENS CAPSULE STUDY;  Surgeon: Beverley Fiedler, MD;  Location: [facility] ENDOSCOPY;  Service: Gastroenterology;  Laterality: N/A;   GIVENS CAPSULE STUDY N/A 06/14/2019   Procedure: GIVENS  CAPSULE STUDY;  Surgeon: Napoleon Form, MD;  Location: [facility] ENDOSCOPY;  Service: Endoscopy;  Laterality: N/A;   HEMOSTASIS CLIP PLACEMENT  11/12/2020   Procedure: HEMOSTASIS CLIP PLACEMENT;  Surgeon: Lemar Lofty., MD;  Location: [facility] ENDOSCOPY;  Service: Gastroenterology;;   HEMOSTASIS CONTROL  11/12/2020   Procedure: HEMOSTASIS CONTROL;  Surgeon: Lemar Lofty., MD;  Location: [facility] ENDOSCOPY;  Service: Gastroenterology;;   HOT HEMOSTASIS N/A 10/28/2018   Procedure: HOT HEMOSTASIS (ARGON PLASMA COAGULATION/BICAP);  Surgeon: Tressia Danas, MD;  Location: [facility] ENDOSCOPY;  Service: Gastroenterology;  Laterality: N/A;   HOT HEMOSTASIS N/A 11/12/2020   Procedure: HOT HEMOSTASIS (ARGON PLASMA COAGULATION/BICAP);  Surgeon: Lemar Lofty., MD;  Location: [facility] ENDOSCOPY;  Service: Gastroenterology;  Laterality: N/A;   HOT HEMOSTASIS N/A 03/22/2022   Procedure: HOT HEMOSTASIS (ARGON PLASMA COAGULATION/BICAP);  Surgeon: Benancio Deeds, MD;  Location: [facility] ENDOSCOPY;  Service: Gastroenterology;  Laterality: N/A;   INCISION AND DRAINAGE PERIRECTAL ABSCESS N/A 06/05/2017   Procedure: IRRIGATION AND DEBRIDEMENT PERIRECTAL ABSCESS;  Surgeon: Andria Meuse, MD;  Location: [facility] OR;  Service: General;  Laterality: N/A;   PERIPHERAL VASCULAR INTERVENTION  12/07/2022   Procedure: PERIPHERAL VASCULAR INTERVENTION;  Surgeon: Leonie Douglas, MD;  Location: [facility];  Service: Cardiovascular;;  SUBMUCOSAL TATTOO INJECTION  11/12/2020   Procedure: SUBMUCOSAL TATTOO INJECTION;  Surgeon: Lemar Lofty., MD;  Location: [facility] ENDOSCOPY;  Service: Gastroenterology;;   VIDEO BRONCHOSCOPY Bilateral 05/08/2016   Procedure: VIDEO BRONCHOSCOPY WITH FLUORO;  Surgeon: Oretha Milch, MD;  Location: [facility] ENDOSCOPY;  Service: Cardiopulmonary;  Laterality: Bilateral;    Family History  Problem Relation Age of Onset   Heart disease Mother    Diabetes Mother    Colon cancer Mother    Liver  cancer Mother    Cancer Father        type unknown   Diabetes Sister        x 2   Diabetes Brother     Social History   Socioeconomic History   Marital status: Single    Spouse name: Not on file   Number of children: 1   Years of education: Not on file   Highest education level: 12th grade  Occupational History   Occupation: retired  Tobacco Use   Smoking status: Every Day    Current packs/day: 0.50    Average packs/day: 0.5 packs/day for 47.0 years (23.5 ttl pk-yrs)    Types: Cigarettes   Smokeless tobacco: Never   Tobacco comments:    4/5 cigs  Vaping Use   Vaping status: Never Used  Substance and Sexual Activity   Alcohol use: Not Currently    Alcohol/week: 0.0 standard drinks of alcohol    Comment: last drink was before xmas   Drug use: No    Types: Cocaine, Marijuana    Comment: "nothing in 20 years"   Sexual activity: Not Currently  Other Topics Concern   Not on file  Social History Narrative   unemployed   Social Determinants of Health   Financial Resource Strain: Medium Risk (01/31/2023)   Overall Financial Resource Strain (CARDIA)    Difficulty of Paying Living Expenses: Somewhat hard  Food Insecurity: No Food Insecurity (01/31/2023)   Hunger Vital Sign    Worried About Running Out of Food in the Last Year: Never true    Ran Out of Food in the Last Year: Never true  Transportation Needs: No Transportation Needs (01/31/2023)   PRAPARE - Administrator, Civil Service (Medical): No    Lack of Transportation (Non-Medical): No  Physical Activity: Insufficiently Active (01/31/2023)   Exercise Vital Sign    Days of Exercise per Week: 3 days    Minutes of Exercise per Session: 10 min  Stress: No Stress Concern Present (01/31/2023)   [employer] of Occupational Health - Occupational Stress Questionnaire    Feeling of Stress : Not at all  Social Connections: Moderately Integrated (01/31/2023)   Social Connection and Isolation Panel [NHANES]     Frequency of Communication with Friends and Family: More than three times a week    Frequency of Social Gatherings with Friends and Family: Twice a week    Attends Religious Services: More than 4 times per year    Active Member of [employer] or Organizations: Yes    Attends Engineer, structural: More than 4 times per year    Marital Status: Divorced    Allergies  Allergen Reactions   Lisinopril Cough    Outpatient Medications Prior to Visit  Medication Sig Dispense Refill   Accu-Chek Softclix Lancets lancets Use to check blood sugar once daily. 100 each 6   acetaminophen (TYLENOL) 500 MG tablet Take 2 tablets (1,000 mg total) by mouth every 8 (eight) hours as  needed for moderate pain. 100 tablet 1   albuterol (VENTOLIN HFA) 108 (90 Base) MCG/ACT inhaler INHALE 2 PUFFS INTO THE LUNGS EVERY 6 (SIX) HOURS AS NEEDED FOR WHEEZING OR SHORTNESS OF BREATH. (Patient taking differently: Inhale 2 puffs into the lungs daily as needed for wheezing or shortness of breath.) 54 g 0   aspirin EC 81 MG tablet Take 1 tablet (81 mg total) by mouth daily. Swallow whole. 150 tablet 2   Blood Glucose Monitoring Suppl (ACCU-CHEK GUIDE) w/Device KIT Use to check blood sugar once daily. 1 kit 0   Budeson-Glycopyrrol-Formoterol (BREZTRI AEROSPHERE) 160-9-4.8 MCG/ACT AERO Take 2 puffs first thing in morning and then another 2 puffs about 12 hours later. (Patient taking differently: Inhale 2 puffs into the lungs in the morning and at bedtime.) 10.7 g 6   DULoxetine (CYMBALTA) 60 MG capsule Take 1 capsule (60 mg total) by mouth daily. For chronic leg pains 30 capsule 3   ergocalciferol (DRISDOL) 1.25 MG (50000 UT) capsule Take 1 capsule (50,000 Units total) by mouth once a week. 12 capsule 1   ezetimibe (ZETIA) 10 MG tablet Take 1 tablet (10 mg total) by mouth daily. 90 tablet 0   ferrous sulfate (FEROSUL) 325 (65 FE) MG tablet Take 1 tablet (325 mg total) by mouth daily with breakfast. 60 tablet 3   folic acid  (FOLVITE) 1 MG tablet Take 1 tablet (1 mg total) by mouth daily. 90 tablet 1   furosemide (LASIX) 40 MG tablet Take 1-2 tablets (40-80 mg total) by mouth 2 (two) times daily as needed.Take an extra pill with worsening pedal edema. 270 tablet 1   gabapentin (NEURONTIN) 300 MG capsule Take 1 capsule (300 mg total) by mouth 2 (two) times daily. 90 capsule 1   glucose blood (ACCU-CHEK GUIDE) test strip Use to check blood sugar once daily. 100 each 6   isosorbide mononitrate (IMDUR) 30 MG 24 hr tablet Take 1 tablet (30 mg total) by mouth daily. 90 tablet 1   OXYGEN Inhale 2 L/min into the lungs continuous.     rivaroxaban (XARELTO) 20 MG TABS tablet Take 1 tablet (20 mg total) by mouth daily with supper. 90 tablet 1   nitroGLYCERIN (NITROSTAT) 0.4 MG SL tablet Place 1 tablet (0.4 mg total) under the tongue every 5 (five) minutes as needed for chest pain. (Patient not taking: Reported on 12/04/2022)     atorvastatin (LIPITOR) 80 MG tablet Take 1 tablet (80 mg total) by mouth daily. 90 tablet 1   hydrALAZINE (APRESOLINE) 50 MG tablet Take 1 tablet (50 mg total) by mouth every 8 (eight) hours. 90 tablet 0   metoprolol succinate (TOPROL-XL) 50 MG 24 hr tablet Take 1 tablet (50 mg total) by mouth daily. Take with or immediately following a meal. 90 tablet 1   pantoprazole (PROTONIX) 40 MG tablet Take 1 tablet (40 mg total) by mouth daily. 90 tablet 1   No facility-administered medications prior to visit.     ROS Review of Systems  Constitutional:  Negative for activity change and appetite change.  HENT:  Negative for sinus pressure and sore throat.   Respiratory:  Negative for chest tightness, shortness of breath and wheezing.   Cardiovascular:  Negative for chest pain and palpitations.  Gastrointestinal:  Negative for abdominal distention, abdominal pain and constipation.  Genitourinary: Negative.   Musculoskeletal:        See HPI  Psychiatric/Behavioral:  Negative for behavioral problems and  dysphoric mood.     Objective:  BP 129/65 (BP Location: Left Arm, Patient Position: Sitting, Cuff Size: Normal)   Pulse (!) 105   Ht 6' 2.5" (1.892 m)   Wt 216 lb 3.2 oz (98.1 kg)   SpO2 92%   BMI 27.39 kg/m      02/04/2023    9:08 AM 01/29/2023    8:53 AM 01/28/2023   12:09 PM  BP/Weight  Systolic BP 129 119 104  Diastolic BP 65 71 58  Wt. (Lbs) 216.2  216  BMI 27.39 kg/m2  27.36 kg/m2      Physical Exam Constitutional:      Appearance: He is well-developed.  Cardiovascular:     Rate and Rhythm: Tachycardia present.     Heart sounds: Normal heart sounds. No murmur heard. Pulmonary:     Effort: Pulmonary effort is normal.     Breath sounds: Normal breath sounds. No wheezing or rales.  Chest:     Chest wall: No tenderness.  Abdominal:     General: Bowel sounds are normal. There is no distension.     Palpations: Abdomen is soft. There is no mass.     Tenderness: There is no abdominal tenderness.  Musculoskeletal:     Right lower leg: No edema.     Left lower leg: No edema.     Comments: Slight left popliteal tenderness with associated extension of left knee  Neurological:     Mental Status: He is alert and oriented to person, place, and time.  Psychiatric:        Mood and Affect: Mood normal.        Latest Ref Rng & Units 12/28/2022    3:09 AM 12/27/2022    9:16 AM 12/26/2022   11:45 PM  CMP  Glucose 70 - 99 mg/dL 865  784  696   BUN 8 - 23 mg/dL 15  16  15    Creatinine 0.61 - 1.24 mg/dL 2.95  2.84  1.32   Sodium 135 - 145 mmol/L 138  139  136   Potassium 3.5 - 5.1 mmol/L 3.7  4.1  4.3   Chloride 98 - 111 mmol/L 98  100  97   CO2 22 - 32 mmol/L 31   29   Calcium 8.9 - 10.3 mg/dL 8.7   8.8     Lipid Panel     Component Value Date/Time   CHOL 127 10/25/2022 1027   TRIG 80 10/25/2022 1027   HDL 49 10/25/2022 1027   CHOLHDL 2.9 01/29/2020 1020   CHOLHDL 2.8 01/11/2016 0446   VLDL 14 01/11/2016 0446   LDLCALC 62 10/25/2022 1027    CBC    Component  Value Date/Time   WBC 8.8 01/28/2023 1335   WBC 12.7 (H) 12/28/2022 0309   RBC 4.13 (L) 01/28/2023 1335   HGB 10.6 (L) 01/28/2023 1335   HGB 9.8 (L) 01/09/2023 1029   HCT 36.2 (L) 01/28/2023 1335   HCT 33.9 (L) 01/09/2023 1029   PLT 306 01/28/2023 1335   PLT 312 01/09/2023 1029   MCV 87.7 01/28/2023 1335   MCV 85 01/09/2023 1029   MCH 25.7 (L) 01/28/2023 1335   MCHC 29.3 (L) 01/28/2023 1335   RDW 17.0 (H) 01/28/2023 1335   RDW 18.4 (H) 01/09/2023 1029   LYMPHSABS 1.9 01/28/2023 1335   LYMPHSABS 1.8 01/09/2023 1029   MONOABS 0.8 01/28/2023 1335   EOSABS 0.2 01/28/2023 1335   EOSABS 0.1 01/09/2023 1029   BASOSABS 0.1 01/28/2023 1335   BASOSABS 0.1 01/09/2023 1029  Lab Results  Component Value Date   HGBA1C 5.0 10/25/2022    Assessment & Plan:      Iron Deficiency Anemia History of AVM, GI bleed, multiple endoscopies; asymptomatic at this time Patient is scheduled for monthly blood work and potential iron infusions based on lab results. Hematologist follow-up in six months. -Continue current plan with hematologist.  Peripheral Vascular Disease Pain in the back of the left knee, improved with Cymbalta. No pain in the legs down to the foot. -Continue Cymbalta for chronic pain management. -Continue physical therapy.  Atrial Fibrillation Currently in sinus rhythm but he is not rate controlled. Patient is on Xarelto with no reported bleeding or blood in stool. Cardiology follow-up in October. -Continue Xarelto for anticoagulation and metoprolol for rate control  Hypertensive heart disease EF 45 to 50% from echo of 12/2021 Euvolemic Continue hydralazine, Imdur, beta-blocker  Smoking Patient continues to smoke about ten cigarettes a day. Patches were not helpful. -Continue to encourage smoking cessation.  Follow-up in three months.          Meds ordered this encounter  Medications   atorvastatin (LIPITOR) 80 MG tablet    Sig: Take 1 tablet (80 mg total) by  mouth daily.    Dispense:  90 tablet    Refill:  1   hydrALAZINE (APRESOLINE) 50 MG tablet    Sig: Take 1 tablet (50 mg total) by mouth every 8 (eight) hours.    Dispense:  270 tablet    Refill:  1   metoprolol succinate (TOPROL-XL) 50 MG 24 hr tablet    Sig: Take 1 tablet (50 mg total) by mouth daily. Take with or immediately following a meal.    Dispense:  90 tablet    Refill:  1   pantoprazole (PROTONIX) 40 MG tablet    Sig: Take 1 tablet (40 mg total) by mouth daily.    Dispense:  90 tablet    Refill:  1    Follow-up: Return in about 3 months (around 05/07/2023) for Chronic medical conditions.       [name], MD, FAAFP. [facility] [place], [place] [phone]   02/04/2023, 11:51 AM

## 2023-02-04 NOTE — Telephone Encounter (Signed)
Auth Submission: NO AUTH NEEDED Site of care: Site of care: [facility] Payer: [employer] & Medicaid Medication & CPT/J Code(s) submitted: Venofer (Iron Sucrose) J1756  Auth type: Buy/Bill PB Units/visits requested: 300mg  x 3 doses  Approval from: 02/04/23 to 06/06/23

## 2023-02-05 DIAGNOSIS — J449 Chronic obstructive pulmonary disease, unspecified: Secondary | ICD-10-CM | POA: Diagnosis not present

## 2023-02-05 DIAGNOSIS — E1151 Type 2 diabetes mellitus with diabetic peripheral angiopathy without gangrene: Secondary | ICD-10-CM | POA: Diagnosis not present

## 2023-02-05 DIAGNOSIS — I4892 Unspecified atrial flutter: Secondary | ICD-10-CM | POA: Diagnosis not present

## 2023-02-05 DIAGNOSIS — J9612 Chronic respiratory failure with hypercapnia: Secondary | ICD-10-CM | POA: Diagnosis not present

## 2023-02-05 DIAGNOSIS — D509 Iron deficiency anemia, unspecified: Secondary | ICD-10-CM | POA: Diagnosis not present

## 2023-02-05 DIAGNOSIS — I110 Hypertensive heart disease with heart failure: Secondary | ICD-10-CM | POA: Diagnosis not present

## 2023-02-05 DIAGNOSIS — I5042 Chronic combined systolic (congestive) and diastolic (congestive) heart failure: Secondary | ICD-10-CM | POA: Diagnosis not present

## 2023-02-05 DIAGNOSIS — J9611 Chronic respiratory failure with hypoxia: Secondary | ICD-10-CM | POA: Diagnosis not present

## 2023-02-05 DIAGNOSIS — I4891 Unspecified atrial fibrillation: Secondary | ICD-10-CM | POA: Diagnosis not present

## 2023-02-06 ENCOUNTER — Encounter: Payer: Self-pay | Admitting: Nurse Practitioner

## 2023-02-06 ENCOUNTER — Ambulatory Visit: Payer: Medicare HMO | Attending: Family Medicine

## 2023-02-06 VITALS — Ht 74.5 in | Wt 216.0 lb

## 2023-02-06 DIAGNOSIS — Z0000 Encounter for general adult medical examination without abnormal findings: Secondary | ICD-10-CM | POA: Diagnosis not present

## 2023-02-06 NOTE — Patient Instructions (Signed)
Jared [name] , Thank you for taking time to come for your Medicare Wellness Visit. I appreciate your ongoing commitment to your health goals. Please review the following plan we discussed and let me know if I can assist you in the future.   Referrals/Orders/Follow-Ups/Clinician Recommendations: Aim for 30 minutes of exercise or brisk walking, 6-8 glasses of water, and 5 servings of fruits and vegetables each day.  This is a list of the screening recommended for you and due dates:  Health Maintenance  Topic Date Due   COVID-19 Vaccine (6 - 2023-24 season) 07/06/2022   Flu Shot  01/10/2023   Hemoglobin A1C  04/27/2023   Eye exam for diabetics  05/03/2023   Yearly kidney function blood test for diabetes  12/28/2023   Yearly kidney health urinalysis for diabetes  01/09/2024   Screening for Lung Cancer  01/10/2024   Complete foot exam   01/29/2024   Medicare Annual Wellness Visit  02/06/2024   Colon Cancer Screening  09/05/2028   DTaP/Tdap/Td vaccine (2 - Td or Tdap) 03/16/2031   Pneumonia Vaccine  Completed   Hepatitis C Screening  Completed   Zoster (Shingles) Vaccine  Completed   HPV Vaccine  Aged Out    Advanced directives: (ACP Link)Information on Advanced Care Planning can be found at [facility] Advance Health Care Directives Advance Health Care Directives ([email or web address]   Next Medicare Annual Wellness Visit scheduled for next year: Yes

## 2023-02-06 NOTE — Addendum Note (Signed)
Addended by: Desma Mcgregor on: 02/06/2023 10:52 AM   Modules accepted: Orders

## 2023-02-06 NOTE — Progress Notes (Signed)
Subjective:   Jared [name] is a 67 y.o. male who presents for Medicare Annual/Subsequent preventive examination.  Visit Complete: Virtual  I connected with  Jared [name] on 02/06/23 by a audio enabled telemedicine application and verified that I am speaking with the correct person using two identifiers.  Patient Location: Home  Provider Location: Home Office  I discussed the limitations of evaluation and management by telemedicine. The patient expressed understanding and agreed to proceed.  Vital Signs: Because this visit was a virtual/telehealth visit, some criteria may be missing or patient reported. Any vitals not documented were not able to be obtained and vitals that have been documented are patient reported.   Review of Systems     Cardiac Risk Factors include: advanced age (>86men, >16 women);diabetes mellitus;dyslipidemia;male gender;hypertension;smoking/ tobacco exposure     Objective:    Today's Vitals   02/06/23 1235  Weight: 216 lb (98 kg)  Height: 6' 2.5" (1.892 m)   Body mass index is 27.36 kg/m.     02/06/2023   12:51 PM 12/27/2022    9:01 AM 12/26/2022    4:54 PM 12/26/2022    9:29 AM 12/07/2022    7:38 AM 05/25/2022    2:24 PM 03/22/2022   12:00 PM  Advanced Directives  Does Patient Have a Medical Advance Directive? No No Yes Yes Yes Yes No  Type of Cytogeneticist of [place];Living will Healthcare Power of Attorney   Does patient want to make changes to medical advance directive?   No - Patient declined  No - Patient declined    Copy of Healthcare Power of Attorney in Chart?   No - copy requested  No - copy requested    Would patient like information on creating a medical advance directive? Yes (MAU/Ambulatory/Procedural Areas - Information given)          Current Medications (verified) Outpatient Encounter Medications as of 02/06/2023  Medication Sig   Accu-Chek Softclix Lancets lancets Use to check  blood sugar once daily.   acetaminophen (TYLENOL) 500 MG tablet Take 2 tablets (1,000 mg total) by mouth every 8 (eight) hours as needed for moderate pain.   albuterol (VENTOLIN HFA) 108 (90 Base) MCG/ACT inhaler INHALE 2 PUFFS INTO THE LUNGS EVERY 6 (SIX) HOURS AS NEEDED FOR WHEEZING OR SHORTNESS OF BREATH. (Patient taking differently: Inhale 2 puffs into the lungs daily as needed for wheezing or shortness of breath.)   aspirin EC 81 MG tablet Take 1 tablet (81 mg total) by mouth daily. Swallow whole.   atorvastatin (LIPITOR) 80 MG tablet Take 1 tablet (80 mg total) by mouth daily.   Blood Glucose Monitoring Suppl (ACCU-CHEK GUIDE) w/Device KIT Use to check blood sugar once daily.   Budeson-Glycopyrrol-Formoterol (BREZTRI AEROSPHERE) 160-9-4.8 MCG/ACT AERO Take 2 puffs first thing in morning and then another 2 puffs about 12 hours later. (Patient taking differently: Inhale 2 puffs into the lungs in the morning and at bedtime.)   DULoxetine (CYMBALTA) 60 MG capsule Take 1 capsule (60 mg total) by mouth daily. For chronic leg pains   ergocalciferol (DRISDOL) 1.25 MG (50000 UT) capsule Take 1 capsule (50,000 Units total) by mouth once a week.   ezetimibe (ZETIA) 10 MG tablet Take 1 tablet (10 mg total) by mouth daily.   ferrous sulfate (FEROSUL) 325 (65 FE) MG tablet Take 1 tablet (325 mg total) by mouth daily with breakfast.   folic acid (FOLVITE) 1 MG tablet Take 1  tablet (1 mg total) by mouth daily.   furosemide (LASIX) 40 MG tablet Take 1-2 tablets (40-80 mg total) by mouth 2 (two) times daily as needed.Take an extra pill with worsening pedal edema.   gabapentin (NEURONTIN) 300 MG capsule Take 1 capsule (300 mg total) by mouth 2 (two) times daily.   glucose blood (ACCU-CHEK GUIDE) test strip Use to check blood sugar once daily.   hydrALAZINE (APRESOLINE) 50 MG tablet Take 1 tablet (50 mg total) by mouth every 8 (eight) hours.   isosorbide mononitrate (IMDUR) 30 MG 24 hr tablet Take 1 tablet (30 mg  total) by mouth daily.   metoprolol succinate (TOPROL-XL) 50 MG 24 hr tablet Take 1 tablet (50 mg total) by mouth daily. Take with or immediately following a meal.   OXYGEN Inhale 2 L/min into the lungs continuous.   pantoprazole (PROTONIX) 40 MG tablet Take 1 tablet (40 mg total) by mouth daily.   rivaroxaban (XARELTO) 20 MG TABS tablet Take 1 tablet (20 mg total) by mouth daily with supper.   nitroGLYCERIN (NITROSTAT) 0.4 MG SL tablet Place 1 tablet (0.4 mg total) under the tongue every 5 (five) minutes as needed for chest pain. (Patient not taking: Reported on 12/04/2022)   [DISCONTINUED] TUDORZA PRESSAIR 400 MCG/ACT AEPB INHALE 1 PUFF INTO THE LUNGS IN THE MORNING AND AT BEDTIME.   No facility-administered encounter medications on file as of 02/06/2023.    Allergies (verified) Lisinopril   History: Past Medical History:  Diagnosis Date   AVM (arteriovenous malformation)    CAD (coronary artery disease)    a. LHC 5/12:  LAD 20, pLCx 20, pRCA 40, dRCA 40, EF 35%, diff HK  //  b. Myoview 4/16: Overall Impression:  High risk stress nuclear study There is no evidence of ischemia.  There is severe LV dysfunction. LV Ejection Fraction: 30%.  LV Wall Motion:  There is global LV hypokinesis.     CAP (community acquired pneumonia) 09/2013   Chronic combined systolic and diastolic CHF (congestive heart failure) (HCC)    a. Echo 4/16:Mild LVH, EF 40-45%, diffuse HK //  b. Echo 8/17: EF 35-40%, diffuse HK, diastolic dysfunction, aortic sclerosis, trivial MR, moderate LAE, normal RVSF, moderate RAE, mild TR, PASP 42 mmHg // c. Echo 4/18: Mild concentric LVH, EF 30-35, normal wall motion, grade 1 diastolic dysfunction, PASP 49   Chronic respiratory failure (HCC)    Cluster headache    "hx; haven't had one in awhile" (01/09/2016)   COPD (chronic obstructive pulmonary disease) (HCC)    [name] 01/09/2016   DM (diabetes mellitus) (HCC)    History of CVA (cerebrovascular accident)    Hypertension    IDA  (iron deficiency anemia)    Moderate tobacco use disorder    NICM (nonischemic cardiomyopathy) (HCC)    Nicotine addiction    Tobacco abuse    Type 2 diabetes mellitus (HCC) 05/14/2016   Past Surgical History:  Procedure Laterality Date   ABDOMINAL AORTOGRAM W/LOWER EXTREMITY N/A 12/07/2022   Procedure: ABDOMINAL AORTOGRAM W/LOWER EXTREMITY;  Surgeon: Leonie Douglas, MD;  [facility];  Service: Cardiovascular;  Laterality: N/A;   BIOPSY  11/12/2020   Procedure: BIOPSY;  Surgeon: Lemar Lofty., MD;  Location: [facility] ENDOSCOPY;  Service: Gastroenterology;;   CARDIAC CATHETERIZATION  10/2010   LM normal, LAD with 20% irregularities, LCX with 20%, RCA with 40% prox and 40% distal - EF of 35%   CATARACT EXTRACTION, BILATERAL     COLONOSCOPY W/  BIOPSIES AND POLYPECTOMY     COLONOSCOPY WITH PROPOFOL N/A 09/06/2018   Procedure: COLONOSCOPY WITH PROPOFOL;  Surgeon: Tressia Danas, MD;  Location: [facility] ENDOSCOPY;  Service: Gastroenterology;  Laterality: N/A;   ENTEROSCOPY N/A 09/28/2018   Procedure: ENTEROSCOPY;  Surgeon: Jeani Hawking, MD;  Location: [facility] ENDOSCOPY;  Service: Endoscopy;  Laterality: N/A;   ENTEROSCOPY N/A 10/28/2018   Procedure: ENTEROSCOPY;  Surgeon: Tressia Danas, MD;  Location: [facility] ENDOSCOPY;  Service: Gastroenterology;  Laterality: N/A;   ENTEROSCOPY N/A 10/09/2020   Procedure: ENTEROSCOPY;  Surgeon: Hilarie Fredrickson, MD;  Location: [facility] ENDOSCOPY;  Service: Endoscopy;  Laterality: N/A;   ENTEROSCOPY N/A 03/22/2022   Procedure: ENTEROSCOPY;  Surgeon: Benancio Deeds, MD;  Location: [facility] ENDOSCOPY;  Service: Gastroenterology;  Laterality: N/A;   ESOPHAGOGASTRODUODENOSCOPY N/A 11/12/2020   Procedure: ESOPHAGOGASTRODUODENOSCOPY (EGD);  Surgeon: Lemar Lofty., MD;  Location: [facility] ENDOS[facility];  Service: Gastroenterology;  Laterality: N/A;   ESOPHAGOGASTRODUODENOSCOPY (EGD) WITH PROPOFOL N/A 09/05/2018   Procedure: ESOPHAGOGASTRODUODENOSCOPY (EGD) WITH PROPOFOL;   Surgeon: Benancio Deeds, MD;  Location: [facility] ENDOSCOPY;  Service: Gastroenterology;  Laterality: N/A;   ESOPHAGOGASTRODUODENOSCOPY (EGD) WITH PROPOFOL N/A 11/19/2020   Procedure: ESOPHAGOGASTRODUODENOSCOPY (EGD) WITH PROPOFOL;  Surgeon: Beverley Fiedler, MD;  Location: [facility] ENDOSCOPY;  Service: Gastroenterology;  Laterality: N/A;   ESOPHAGOGASTRODUODENOSCOPY (EGD) WITH PROPOFOL N/A 12/27/2022   Procedure: ESOPHAGOGASTRODUODENOSCOPY (EGD) WITH PROPOFOL;  Surgeon: Napoleon Form, MD;  Location: [facility] ENDOSCOPY;  Service: Gastroenterology;  Laterality: N/A;   EXCISION MASS HEAD     GIVENS CAPSULE STUDY N/A 09/06/2018   Procedure: GIVENS CAPSULE STUDY;  Surgeon: Tressia Danas, MD;  Location: [facility] ENDOSCOPY;  Service: Gastroenterology;  Laterality: N/A;   GIVENS CAPSULE STUDY N/A 09/26/2018   Procedure: GIVENS CAPSULE STUDY;  Surgeon: Beverley Fiedler, MD;  Location: [facility] ENDOSCOPY;  Service: Gastroenterology;  Laterality: N/A;   GIVENS CAPSULE STUDY N/A 06/14/2019   Procedure: GIVENS CAPSULE STUDY;  Surgeon: Napoleon Form, MD;  [facility] ENDOS[facility];  Service: Endoscopy;  Laterality: N/A;   HEMOSTASIS CLIP PLACEMENT  11/12/2020   Procedure: HEMOSTASIS CLIP PLACEMENT;  Surgeon: Lemar Lofty., MD;  Location: [facility] ENDOSCOPY;  Service: Gastroenterology;;   HEMOSTASIS CONTROL  11/12/2020   Procedure: HEMOSTASIS CONTROL;  Surgeon: Lemar Lofty., MD;  Location: [facility] ENDOSCOPY;  Service: Gastroenterology;;   HOT HEMOSTASIS N/A 10/28/2018   Procedure: HOT HEMOSTASIS (ARGON PLASMA COAGULATION/BICAP);  Surgeon: Tressia Danas, MD;  Location: [facility] ENDOSCOPY;  Service: Gastroenterology;  Laterality: N/A;   HOT HEMOSTASIS N/A 11/12/2020   Procedure: HOT HEMOSTASIS (ARGON PLASMA COAGULATION/BICAP);  Surgeon: Lemar Lofty., MD;  Location: [facility] ENDOSCOPY;  Service: Gastroenterology;  Laterality: N/A;   HOT HEMOSTASIS N/A 03/22/2022   Procedure: HOT HEMOSTASIS (ARGON PLASMA COAGULATION/BICAP);   Surgeon: Benancio Deeds, MD;  Location: [facility] ENDOSCOPY;  Service: Gastroenterology;  Laterality: N/A;   INCISION AND DRAINAGE PERIRECTAL ABSCESS N/A 06/05/2017   Procedure: IRRIGATION AND DEBRIDEMENT PERIRECTAL ABSCESS;  Surgeon: Andria Meuse, MD;  Location: [facility] OR;  Service: General;  Laterality: N/A;   PERIPHERAL VASCULAR INTERVENTION  12/07/2022   Procedure: PERIPHERAL VASCULAR INTERVENTION;  Surgeon: Leonie Douglas, MD;  [facility];  Service: Cardiovascular;;   SUBMUCOSAL TATTOO INJECTION  11/12/2020   Procedure: SUBMUCOSAL TATTOO INJECTION;  Surgeon: Lemar Lofty., MD;  Location: [facility] ENDOSCOPY;  Service: Gastroenterology;;   VIDEO BRONCHOSCOPY Bilateral 05/08/2016   Procedure: VIDEO BRONCHOSCOPY WITH FLUORO;  Surgeon: Oretha Milch, MD;  Location: [facility] ENDOSCOPY;  Service: Cardiopulmonary;  Laterality: Bilateral;  Family History  Problem Relation Age of Onset   Heart disease Mother    Diabetes Mother    Colon cancer Mother    Liver cancer Mother    Cancer Father        type unknown   Diabetes Sister        x 2   Diabetes Brother    Social History   Socioeconomic History   Marital status: Single    Spouse name: Not on file   Number of children: 1   Years of education: Not on file   Highest education level: 12th grade  Occupational History   Occupation: retired  Tobacco Use   Smoking status: Every Day    Current packs/day: 0.50    Average packs/day: 0.5 packs/day for 47.0 years (23.5 ttl pk-yrs)    Types: Cigarettes   Smokeless tobacco: Never   Tobacco comments:    4/5 cigs  Vaping Use   Vaping status: Never Used  Substance and Sexual Activity   Alcohol use: Not Currently    Alcohol/week: 0.0 standard drinks of alcohol    Comment: last drink was before xmas   Drug use: No    Types: Cocaine, Marijuana    Comment: "nothing in 20 years"   Sexual activity: Not Currently  Other Topics Concern   Not on file  Social History Narrative    unemployed   Social Determinants of Health   Financial Resource Strain: Medium Risk (02/06/2023)   Overall Financial Resource Strain (CARDIA)    Difficulty of Paying Living Expenses: Somewhat hard  Food Insecurity: No Food Insecurity (02/06/2023)   Hunger Vital Sign    Worried About Running Out of Food in the Last Year: Never true    Ran Out of Food in the Last Year: Never true  Transportation Needs: No Transportation Needs (02/06/2023)   PRAPARE - Administrator, Civil Service (Medical): No    Lack of Transportation (Non-Medical): No  Physical Activity: Insufficiently Active (02/06/2023)   Exercise Vital Sign    Days of Exercise per Week: 3 days    Minutes of Exercise per Session: 10 min  Stress: No Stress Concern Present (02/06/2023)   [employer] of Occupational Health - Occupational Stress Questionnaire    Feeling of Stress : Not at all  Social Connections: Moderately Integrated (02/06/2023)   Social Connection and Isolation Panel [NHANES]    Frequency of Communication with Friends and Family: More than three times a week    Frequency of Social Gatherings with Friends and Family: Twice a week    Attends Religious Services: More than 4 times per year    Active Member of [employer] or Organizations: Yes    Attends Engineer, structural: More than 4 times per year    Marital Status: Divorced    Tobacco Counseling Ready to quit: Not Answered Counseling given: Not Answered Tobacco comments: 4/5 cigs   Clinical Intake:  Pre-visit preparation completed: Yes  Pain : No/denies pain     Diabetes: Yes CBG done?: No Did pt. bring in CBG monitor from home?: No  How often do you need to have someone help you when you read instructions, pamphlets, or other written materials from your doctor or pharmacy?: 1 - Never  Interpreter Needed?: No  Information entered by :: Kandis Fantasia LPN   Activities of Daily Living    02/06/2023   12:36 PM 12/26/2022     4:54 PM  In your present state of health,  do you have any difficulty performing the following activities:  Hearing? 0 0  Vision? 0 0  Difficulty concentrating or making decisions? 0 0  Walking or climbing stairs? 0 0  Dressing or bathing? 0 0  Doing errands, shopping? 0 0  Preparing Food and eating ? N   Using the Toilet? N   In the past six months, have you accidently leaked urine? N   Do you have problems with loss of bowel control? N   Managing your Medications? N   Managing your Finances? N   Housekeeping or managing your Housekeeping? N     Patient Care Team: [name], MD as PCP - General (Family Medicine) Pricilla Riffle, MD as PCP - Cardiology (Cardiology)  Indicate any recent Medical Services you may have received from other than [facility] providers in the past year (date may be approximate).     Assessment:   This is a routine wellness examination for Stacie.  Hearing/Vision screen Hearing Screening - Comments:: Denies hearing difficulties   Vision Screening - Comments:: Wears rx glasses - up to date with routine eye exams    Dietary issues and exercise activities discussed:     Goals Addressed             This Visit's Progress    COMPLETED: Care Coordination Activities       Care Coordination Interventions: Active listening / Reflection utilized  Emotional Support Provided LCSW informed pt of care coordination services. Pt shared interest and initial appt scheduled for 02/27/22 LCSW discussed flu shot LCSW reviewed upcoming appts     Remain active and independent        Depression Screen    02/04/2023    9:13 AM 01/09/2023    9:28 AM 10/25/2022    9:30 AM 07/26/2022    8:44 AM 04/25/2022    9:44 AM 12/18/2021   10:42 AM 09/20/2021    3:41 PM  PHQ 2/9 Scores  PHQ - 2 Score 0 3 2 1  0 1 0  PHQ- 9 Score 0 11 6 5 4 3      Fall Risk    02/06/2023   12:51 PM 02/04/2023    9:12 AM 01/09/2023    9:28 AM 10/25/2022    9:30 AM 07/26/2022    8:44 AM  Fall  Risk   Falls in the past year? 1 1 1  0 0  Number falls in past yr: 0 0 0 0 0  Injury with Fall? 1 1 1  0 0  Risk for fall due to : History of fall(s)  History of fall(s) No Fall Risks   Follow up Education provided;Falls prevention discussed;Falls evaluation completed        MEDICARE RISK AT HOME: Medicare Risk at Home Any stairs in or around the home?: No If so, are there any without handrails?: No Home free of loose throw rugs in walkways, pet beds, electrical cords, etc?: Yes Adequate lighting in your home to reduce risk of falls?: Yes Life alert?: No Use of a cane, walker or w/c?: No Grab bars in the bathroom?: Yes Shower chair or bench in shower?: No Elevated toilet seat or a handicapped toilet?: Yes  TIMED UP AND GO:  Was the test performed?  No    Cognitive Function:    05/25/2022    2:25 PM  MMSE - Mini Mental State Exam  Orientation to time 5  Orientation to Place 5  Registration 3  Attention/ Calculation 5  Recall 0  Language- name 2 objects 2  Language- repeat 1  Language- follow 3 step command 3  Language- read & follow direction 1  Write a sentence 1  Copy design 1  Total score 27        02/06/2023   12:52 PM 05/25/2022    2:26 PM  6CIT Screen  What Year? 0 points 0 points  What month? 0 points 0 points  What time? 0 points 0 points  Count back from 20 0 points   Months in reverse 0 points 0 points  Repeat phrase 0 points 0 points  Total Score 0 points     Immunizations Immunization History  Administered Date(s) Administered   COVID-19, mRNA, vaccine(Comirnaty)12 years and older 05/11/2022   Fluad Quad(high Dose 65+) 03/09/2019, 04/25/2022   Influenza,inj,Quad PF,6+ Mos 03/20/2016, 05/02/2018, 03/24/2020, 03/15/2021   Influenza-Unspecified 03/28/2017   PFIZER Comirnaty(Gray Top)Covid-19 Tri-Sucrose Vaccine 02/02/2021   PFIZER(Purple Top)SARS-COV-2 Vaccination 08/12/2019, 09/10/2019, 03/17/2020   PNEUMOCOCCAL CONJUGATE-20 12/05/2020    Pneumococcal Polysaccharide-23 01/10/2016, 05/02/2018, 05/18/2019   Respiratory Syncytial Virus Vaccine,Recomb Aduvanted(Arexvy) 04/25/2022   Tdap 03/15/2021   Zoster Recombinant(Shingrix) 05/18/2019, 07/20/2019    TDAP status: Up to date  Flu Vaccine status: Due, Education has been provided regarding the importance of this vaccine. Advised may receive this vaccine at local pharmacy or Health Dept. Aware to provide a copy of the vaccination record if obtained from local pharmacy or Health Dept. Verbalized acceptance and understanding.  Pneumococcal vaccine status: Up to date  Covid-19 vaccine status: Information provided on how to obtain vaccines.   Qualifies for Shingles Vaccine? Yes   Zostavax completed No   Shingrix Completed?: Yes  Screening Tests Health Maintenance  Topic Date Due   COVID-19 Vaccine (6 - 2023-24 season) 07/06/2022   INFLUENZA VACCINE  01/10/2023   HEMOGLOBIN A1C  04/27/2023   OPHTHALMOLOGY EXAM  05/03/2023   Diabetic kidney evaluation - eGFR measurement  12/28/2023   Diabetic kidney evaluation - Urine ACR  01/09/2024   Lung Cancer Screening  01/10/2024   FOOT EXAM  01/29/2024   Medicare Annual Wellness (AWV)  02/06/2024   Colonoscopy  09/05/2028   DTaP/Tdap/Td (2 - Td or Tdap) 03/16/2031   Pneumonia Vaccine 35+ Years old  Completed   Hepatitis C Screening  Completed   Zoster Vaccines- Shingrix  Completed   HPV VACCINES  Aged Out    Health Maintenance  Health Maintenance Due  Topic Date Due   COVID-19 Vaccine (6 - 2023-24 season) 07/06/2022   INFLUENZA VACCINE  01/10/2023    Colorectal cancer screening: Type of screening: Colonoscopy. Completed 09/06/18. Repeat every 10 years  Lung Cancer Screening: (Low Dose CT Chest recommended if Age 36-80 years, 20 pack-year currently smoking OR have quit w/in 15years.) does qualify.   Lung Cancer Screening Referral: last 01/10/23  Additional Screening:  Hepatitis C Screening: does qualify; Completed  08/08/16  Vision Screening: Recommended annual ophthalmology exams for early detection of glaucoma and other disorders of the eye. Is the patient up to date with their annual eye exam?  Yes  Who is the provider or what is the name of the office in which the patient attends annual eye exams? [facility] If pt is not established with a provider, would they like to be referred to a provider to establish care? No .   Dental Screening: Recommended annual dental exams for proper oral hygiene  Diabetic Foot Exam: Diabetic Foot Exam: Completed 01/29/23  Community Resource Referral / Chronic Care Management: CRR  required this visit?  No   CCM required this visit?  No     Plan:     I have personally reviewed and noted the following in the patient's chart:   Medical and social history Use of alcohol, tobacco or illicit drugs  Current medications and supplements including opioid prescriptions. Patient is not currently taking opioid prescriptions. Functional ability and status Nutritional status Physical activity Advanced directives List of other physicians Hospitalizations, surgeries, and ER visits in previous 12 months Vitals Screenings to include cognitive, depression, and falls Referrals and appointments  In addition, I have reviewed and discussed with patient certain preventive protocols, quality metrics, and best practice recommendations. A written personalized care plan for preventive services as well as general preventive health recommendations were provided to patient.     Kandis Fantasia [facility] [place], [place]   4/40/1027   After Visit Summary: (MyChart) Due to this being a telephonic visit, the after visit summary with patients personalized plan was offered to patient via MyChart   Nurse Notes: No concerns at this time

## 2023-02-07 DIAGNOSIS — I110 Hypertensive heart disease with heart failure: Secondary | ICD-10-CM | POA: Diagnosis not present

## 2023-02-07 DIAGNOSIS — I4891 Unspecified atrial fibrillation: Secondary | ICD-10-CM | POA: Diagnosis not present

## 2023-02-07 DIAGNOSIS — J9611 Chronic respiratory failure with hypoxia: Secondary | ICD-10-CM | POA: Diagnosis not present

## 2023-02-07 DIAGNOSIS — D509 Iron deficiency anemia, unspecified: Secondary | ICD-10-CM | POA: Diagnosis not present

## 2023-02-07 DIAGNOSIS — I4892 Unspecified atrial flutter: Secondary | ICD-10-CM | POA: Diagnosis not present

## 2023-02-07 DIAGNOSIS — J449 Chronic obstructive pulmonary disease, unspecified: Secondary | ICD-10-CM | POA: Diagnosis not present

## 2023-02-07 DIAGNOSIS — J9612 Chronic respiratory failure with hypercapnia: Secondary | ICD-10-CM | POA: Diagnosis not present

## 2023-02-07 DIAGNOSIS — E1151 Type 2 diabetes mellitus with diabetic peripheral angiopathy without gangrene: Secondary | ICD-10-CM | POA: Diagnosis not present

## 2023-02-07 DIAGNOSIS — I5042 Chronic combined systolic (congestive) and diastolic (congestive) heart failure: Secondary | ICD-10-CM | POA: Diagnosis not present

## 2023-02-11 DIAGNOSIS — J449 Chronic obstructive pulmonary disease, unspecified: Secondary | ICD-10-CM | POA: Diagnosis not present

## 2023-02-11 DIAGNOSIS — J9612 Chronic respiratory failure with hypercapnia: Secondary | ICD-10-CM | POA: Diagnosis not present

## 2023-02-11 DIAGNOSIS — J9611 Chronic respiratory failure with hypoxia: Secondary | ICD-10-CM | POA: Diagnosis not present

## 2023-02-11 DIAGNOSIS — I5042 Chronic combined systolic (congestive) and diastolic (congestive) heart failure: Secondary | ICD-10-CM | POA: Diagnosis not present

## 2023-02-11 DIAGNOSIS — I4892 Unspecified atrial flutter: Secondary | ICD-10-CM | POA: Diagnosis not present

## 2023-02-11 DIAGNOSIS — D509 Iron deficiency anemia, unspecified: Secondary | ICD-10-CM | POA: Diagnosis not present

## 2023-02-11 DIAGNOSIS — I4891 Unspecified atrial fibrillation: Secondary | ICD-10-CM | POA: Diagnosis not present

## 2023-02-11 DIAGNOSIS — I110 Hypertensive heart disease with heart failure: Secondary | ICD-10-CM | POA: Diagnosis not present

## 2023-02-11 DIAGNOSIS — E1151 Type 2 diabetes mellitus with diabetic peripheral angiopathy without gangrene: Secondary | ICD-10-CM | POA: Diagnosis not present

## 2023-02-12 ENCOUNTER — Telehealth (INDEPENDENT_AMBULATORY_CARE_PROVIDER_SITE_OTHER): Payer: Self-pay | Admitting: Family Medicine

## 2023-02-12 NOTE — Telephone Encounter (Signed)
Copied from CRM [id number]. Topic: General - Other >> Feb 12, 2023  3:29 PM Franchot Heidelberg wrote: Reason for CRM: Lidia from [facility] says they faxed over an update to the pt's OT Order frequency, she wants to know if it was received? Faxed over on 02/01/2023.   Best contact: [phone]. [phone]

## 2023-02-13 ENCOUNTER — Ambulatory Visit (INDEPENDENT_AMBULATORY_CARE_PROVIDER_SITE_OTHER): Payer: Medicare HMO

## 2023-02-13 VITALS — BP 131/78 | HR 78 | Temp 97.6°F | Resp 16 | Ht 75.0 in | Wt 216.6 lb

## 2023-02-13 DIAGNOSIS — K279 Peptic ulcer, site unspecified, unspecified as acute or chronic, without hemorrhage or perforation: Secondary | ICD-10-CM

## 2023-02-13 DIAGNOSIS — D500 Iron deficiency anemia secondary to blood loss (chronic): Secondary | ICD-10-CM

## 2023-02-13 MED ORDER — SODIUM CHLORIDE 0.9 % IV SOLN
300.0000 mg | Freq: Once | INTRAVENOUS | Status: AC
  Administered 2023-02-13: 300 mg via INTRAVENOUS
  Filled 2023-02-13: qty 15

## 2023-02-13 MED ORDER — DIPHENHYDRAMINE HCL 25 MG PO CAPS
25.0000 mg | ORAL_CAPSULE | Freq: Once | ORAL | Status: AC
  Administered 2023-02-13: 25 mg via ORAL
  Filled 2023-02-13: qty 1

## 2023-02-13 MED ORDER — ACETAMINOPHEN 325 MG PO TABS
650.0000 mg | ORAL_TABLET | Freq: Once | ORAL | Status: AC
  Administered 2023-02-13: 650 mg via ORAL
  Filled 2023-02-13: qty 2

## 2023-02-13 NOTE — Telephone Encounter (Signed)
Fax has been received and will be faxed once signed.

## 2023-02-13 NOTE — Progress Notes (Cosign Needed)
Diagnosis: Iron Deficiency Anemia  Provider:  Chilton Greathouse MD  Procedure: IV Infusion  IV Type: Peripheral, IV Location: R Hand  Venofer (Iron Sucrose), Dose: 300 mg  Infusion Start Time: 1011  Infusion Stop Time: 1153  Post Infusion IV Care: Observation period completed and Peripheral IV Discontinued  Discharge: Condition: Good, Destination: Home . AVS Provided  Performed by:  Garnette Czech, RN

## 2023-02-13 NOTE — Patient Instructions (Signed)
 Iron Sucrose Injection What is this medication? IRON SUCROSE (EYE ern SOO krose) treats low levels of iron (iron deficiency anemia) in people with kidney disease. Iron is a mineral that plays an important role in making red blood cells, which carry oxygen from your lungs to the rest of your body. This medicine may be used for other purposes; ask your health care provider or pharmacist if you have questions. COMMON BRAND NAME(S): Venofer What should I tell my care team before I take this medication? They need to know if you have any of these conditions: Anemia not caused by low iron levels Heart disease High levels of iron in the blood Kidney disease Liver disease An unusual or allergic reaction to iron, other medications, foods, dyes, or preservatives Pregnant or trying to get pregnant Breastfeeding How should I use this medication? This medication is for infusion into a vein. It is given in a hospital or clinic setting. Talk to your care team about the use of this medication in children. While this medication may be prescribed for children as young as 2 years for selected conditions, precautions do apply. Overdosage: If you think you have taken too much of this medicine contact a poison control center or emergency room at once. NOTE: This medicine is only for you. Do not share this medicine with others. What if I miss a dose? Keep appointments for follow-up doses. It is important not to miss your dose. Call your care team if you are unable to keep an appointment. What may interact with this medication? Do not take this medication with any of the following: Deferoxamine Dimercaprol Other iron products This medication may also interact with the following: Chloramphenicol Deferasirox This list may not describe all possible interactions. Give your health care provider a list of all the medicines, herbs, non-prescription drugs, or dietary supplements you use. Also tell them if you smoke,  drink alcohol, or use illegal drugs. Some items may interact with your medicine. What should I watch for while using this medication? Visit your care team regularly. Tell your care team if your symptoms do not start to get better or if they get worse. You may need blood work done while you are taking this medication. You may need to follow a special diet. Talk to your care team. Foods that contain iron include: whole grains/cereals, dried fruits, beans, or peas, leafy green vegetables, and organ meats (liver, kidney). What side effects may I notice from receiving this medication? Side effects that you should report to your care team as soon as possible: Allergic reactions--skin rash, itching, hives, swelling of the face, lips, tongue, or throat Low blood pressure--dizziness, feeling faint or lightheaded, blurry vision Shortness of breath Side effects that usually do not require medical attention (report to your care team if they continue or are bothersome): Flushing Headache Joint pain Muscle pain Nausea Pain, redness, or irritation at injection site This list may not describe all possible side effects. Call your doctor for medical advice about side effects. You may report side effects to FDA at 1-800-FDA-1088. Where should I keep my medication? This medication is given in a hospital or clinic. It will not be stored at home. NOTE: This sheet is a summary. It may not cover all possible information. If you have questions about this medicine, talk to your doctor, pharmacist, or health care provider.  2024 Elsevier/Gold Standard (2022-11-02 00:00:00)

## 2023-02-14 DIAGNOSIS — J9612 Chronic respiratory failure with hypercapnia: Secondary | ICD-10-CM | POA: Diagnosis not present

## 2023-02-14 DIAGNOSIS — E1151 Type 2 diabetes mellitus with diabetic peripheral angiopathy without gangrene: Secondary | ICD-10-CM | POA: Diagnosis not present

## 2023-02-14 DIAGNOSIS — I5042 Chronic combined systolic (congestive) and diastolic (congestive) heart failure: Secondary | ICD-10-CM | POA: Diagnosis not present

## 2023-02-14 DIAGNOSIS — D509 Iron deficiency anemia, unspecified: Secondary | ICD-10-CM | POA: Diagnosis not present

## 2023-02-14 DIAGNOSIS — I110 Hypertensive heart disease with heart failure: Secondary | ICD-10-CM | POA: Diagnosis not present

## 2023-02-14 DIAGNOSIS — I4891 Unspecified atrial fibrillation: Secondary | ICD-10-CM | POA: Diagnosis not present

## 2023-02-14 DIAGNOSIS — J9611 Chronic respiratory failure with hypoxia: Secondary | ICD-10-CM | POA: Diagnosis not present

## 2023-02-14 DIAGNOSIS — I4892 Unspecified atrial flutter: Secondary | ICD-10-CM | POA: Diagnosis not present

## 2023-02-14 DIAGNOSIS — J449 Chronic obstructive pulmonary disease, unspecified: Secondary | ICD-10-CM | POA: Diagnosis not present

## 2023-02-20 ENCOUNTER — Ambulatory Visit (INDEPENDENT_AMBULATORY_CARE_PROVIDER_SITE_OTHER): Payer: Medicare HMO

## 2023-02-20 VITALS — BP 135/66 | HR 81 | Temp 97.6°F | Resp 18 | Ht 74.4 in | Wt 215.6 lb

## 2023-02-20 DIAGNOSIS — K279 Peptic ulcer, site unspecified, unspecified as acute or chronic, without hemorrhage or perforation: Secondary | ICD-10-CM

## 2023-02-20 DIAGNOSIS — D500 Iron deficiency anemia secondary to blood loss (chronic): Secondary | ICD-10-CM | POA: Diagnosis not present

## 2023-02-20 MED ORDER — DIPHENHYDRAMINE HCL 25 MG PO CAPS
25.0000 mg | ORAL_CAPSULE | Freq: Once | ORAL | Status: AC
  Administered 2023-02-20: 25 mg via ORAL
  Filled 2023-02-20: qty 1

## 2023-02-20 MED ORDER — ACETAMINOPHEN 325 MG PO TABS
650.0000 mg | ORAL_TABLET | Freq: Once | ORAL | Status: AC
  Administered 2023-02-20: 650 mg via ORAL
  Filled 2023-02-20: qty 2

## 2023-02-20 MED ORDER — SODIUM CHLORIDE 0.9 % IV SOLN
300.0000 mg | Freq: Once | INTRAVENOUS | Status: AC
  Administered 2023-02-20: 300 mg via INTRAVENOUS
  Filled 2023-02-20: qty 15

## 2023-02-20 NOTE — Progress Notes (Signed)
Diagnosis: Acute Anemia  Provider:  Chilton Greathouse MD  Procedure: IV Infusion  IV Type: Peripheral, IV Location: R Forearm  Venofer (Iron Sucrose), Dose: 300 mg  Infusion Start Time: 1049  Infusion Stop Time: 1229  Post Infusion IV Care: Patient declined observation and Peripheral IV Discontinued  Discharge: Condition: Stable, Destination: Home . AVS Declined  Performed by:  Nat Math, RN

## 2023-02-20 NOTE — Progress Notes (Signed)
Diagnosis: Iron Deficiency Anemia  Provider:  Chilton Greathouse MD  Procedure: IV Infusion  IV Type: Peripheral, IV Location: R Hand  Venofer (Iron Sucrose), Dose: 300 mg  Infusion Start Time: 1011  Infusion Stop Time: 1153  Post Infusion IV Care: Observation period completed and Peripheral IV Discontinued  Discharge: Condition: Good, Destination: Home . AVS Provided  Performed by:  Sabitri Engineering geologist

## 2023-02-21 DIAGNOSIS — I110 Hypertensive heart disease with heart failure: Secondary | ICD-10-CM | POA: Diagnosis not present

## 2023-02-21 DIAGNOSIS — I4891 Unspecified atrial fibrillation: Secondary | ICD-10-CM | POA: Diagnosis not present

## 2023-02-21 DIAGNOSIS — I5042 Chronic combined systolic (congestive) and diastolic (congestive) heart failure: Secondary | ICD-10-CM | POA: Diagnosis not present

## 2023-02-21 DIAGNOSIS — D509 Iron deficiency anemia, unspecified: Secondary | ICD-10-CM | POA: Diagnosis not present

## 2023-02-21 DIAGNOSIS — I4892 Unspecified atrial flutter: Secondary | ICD-10-CM | POA: Diagnosis not present

## 2023-02-21 DIAGNOSIS — J449 Chronic obstructive pulmonary disease, unspecified: Secondary | ICD-10-CM | POA: Diagnosis not present

## 2023-02-21 DIAGNOSIS — E1151 Type 2 diabetes mellitus with diabetic peripheral angiopathy without gangrene: Secondary | ICD-10-CM | POA: Diagnosis not present

## 2023-02-21 DIAGNOSIS — J9612 Chronic respiratory failure with hypercapnia: Secondary | ICD-10-CM | POA: Diagnosis not present

## 2023-02-21 DIAGNOSIS — J9611 Chronic respiratory failure with hypoxia: Secondary | ICD-10-CM | POA: Diagnosis not present

## 2023-02-24 ENCOUNTER — Other Ambulatory Visit: Payer: Self-pay | Admitting: Internal Medicine

## 2023-02-24 ENCOUNTER — Other Ambulatory Visit: Payer: Self-pay | Admitting: Family Medicine

## 2023-02-24 ENCOUNTER — Other Ambulatory Visit: Payer: Self-pay | Admitting: Orthopaedic Surgery

## 2023-02-25 ENCOUNTER — Encounter: Payer: Self-pay | Admitting: Nurse Practitioner

## 2023-02-25 ENCOUNTER — Other Ambulatory Visit: Payer: Self-pay

## 2023-02-25 ENCOUNTER — Inpatient Hospital Stay: Payer: Medicare HMO | Attending: Nurse Practitioner

## 2023-02-25 DIAGNOSIS — D500 Iron deficiency anemia secondary to blood loss (chronic): Secondary | ICD-10-CM | POA: Insufficient documentation

## 2023-02-25 LAB — CBC WITH DIFFERENTIAL (CANCER CENTER ONLY)
Abs Immature Granulocytes: 0.02 10*3/uL (ref 0.00–0.07)
Basophils Absolute: 0 10*3/uL (ref 0.0–0.1)
Basophils Relative: 1 %
Eosinophils Absolute: 0.1 10*3/uL (ref 0.0–0.5)
Eosinophils Relative: 1 %
HCT: 35.8 % — ABNORMAL LOW (ref 39.0–52.0)
Hemoglobin: 10.6 g/dL — ABNORMAL LOW (ref 13.0–17.0)
Immature Granulocytes: 0 %
Lymphocytes Relative: 16 %
Lymphs Abs: 1.3 10*3/uL (ref 0.7–4.0)
MCH: 27.4 pg (ref 26.0–34.0)
MCHC: 29.6 g/dL — ABNORMAL LOW (ref 30.0–36.0)
MCV: 92.5 fL (ref 80.0–100.0)
Monocytes Absolute: 0.6 10*3/uL (ref 0.1–1.0)
Monocytes Relative: 7 %
Neutro Abs: 5.9 10*3/uL (ref 1.7–7.7)
Neutrophils Relative %: 75 %
Platelet Count: 274 10*3/uL (ref 150–400)
RBC: 3.87 MIL/uL — ABNORMAL LOW (ref 4.22–5.81)
RDW: 17.4 % — ABNORMAL HIGH (ref 11.5–15.5)
WBC Count: 7.9 10*3/uL (ref 4.0–10.5)
nRBC: 0.3 % — ABNORMAL HIGH (ref 0.0–0.2)

## 2023-02-25 LAB — FERRITIN: Ferritin: 77 ng/mL (ref 24–336)

## 2023-02-25 LAB — IRON AND IRON BINDING CAPACITY (CC-WL,HP ONLY)
Iron: 82 ug/dL (ref 45–182)
Saturation Ratios: 24 % (ref 17.9–39.5)
TIBC: 349 ug/dL (ref 250–450)
UIBC: 267 ug/dL (ref 117–376)

## 2023-02-25 MED ORDER — BREZTRI AEROSPHERE 160-9-4.8 MCG/ACT IN AERO
INHALATION_SPRAY | RESPIRATORY_TRACT | 1 refills | Status: DC
  Filled 2023-02-25: qty 10.7, fill #0
  Filled 2023-03-13 – 2023-03-14 (×2): qty 10.7, 30d supply, fill #0
  Filled 2023-04-09: qty 10.7, 30d supply, fill #1

## 2023-02-25 MED ORDER — GABAPENTIN 300 MG PO CAPS
300.0000 mg | ORAL_CAPSULE | Freq: Two times a day (BID) | ORAL | 1 refills | Status: DC
  Filled 2023-02-25 – 2023-03-14 (×3): qty 90, 45d supply, fill #0
  Filled 2023-06-06 (×2): qty 90, 45d supply, fill #1

## 2023-02-26 MED ORDER — ERGOCALCIFEROL 1.25 MG (50000 UT) PO CAPS
50000.0000 [IU] | ORAL_CAPSULE | ORAL | 0 refills | Status: DC
  Filled 2023-02-26: qty 12, 84d supply, fill #0

## 2023-02-26 NOTE — Telephone Encounter (Signed)
Requested medication (s) are due for refill today:   Provider to review  Requested medication (s) are on the active medication list:   Yes  Future visit scheduled:   Yes in 2  months.   Last ordered: 07/27/2022 #12, 1 refill  Returned because it's a non delegated refill    Requested Prescriptions  Pending Prescriptions Disp Refills   ergocalciferol (DRISDOL) 1.25 MG (50000 UT) capsule 12 capsule 1    Sig: Take 1 capsule (50,000 Units total) by mouth once a week.     Endocrinology:  Vitamins - Vitamin D Supplementation 2 Failed - 02/24/2023 10:33 AM      Failed - Manual Review: Route requests for 50,000 IU strength to the provider      Failed - Ca in normal range and within 360 days    Calcium  Date Value Ref Range Status  12/28/2022 8.7 (L) 8.9 - 10.3 mg/dL Final   Calcium, Ion  Date Value Ref Range Status  12/27/2022 1.16 1.15 - 1.40 mmol/L Final         Failed - Vitamin D in normal range and within 360 days    Vit D, 25-Hydroxy  Date Value Ref Range Status  07/26/2022 26.5 (L) 30.0 - 100.0 ng/mL Final    Comment:    Vitamin D deficiency has been defined by the Institute of Medicine and an Endocrine Society practice guideline as a level of serum 25-OH vitamin D less than 20 ng/mL (1,2). The Endocrine Society went on to further define vitamin D insufficiency as a level between 21 and 29 ng/mL (2). 1. IOM (Institute of Medicine). 2010. Dietary reference    intakes for calcium and D. [place]: The    [employer]. 2. Holick MF, [place], Bischoff-Ferrari HA, et al.    Evaluation, treatment, and prevention of vitamin D    deficiency: an Endocrine Society clinical practice    guideline. JCEM. 2011 Jul; 96(7):1911-30.          Passed - Valid encounter within last 12 months    Recent Outpatient Visits           3 weeks ago Iron deficiency anemia due to chronic blood loss   [facility] [place], [place], MD   1 month  ago Iron deficiency anemia due to chronic blood loss   [facility] [place], Odette Horns, MD   4 months ago Type 2 diabetes mellitus with other specified complication, without long-term current use of insulin (HCC)   [facility] [name], MD   7 months ago Bilateral impacted cerumen   [facility] [name], MD   10 months ago COPD GOLD III/still smoking     [facility] [name], MD       Future Appointments             In 3 weeks Pricilla Riffle, MD [facility]   In 2 months [name], MD [facility]

## 2023-02-27 ENCOUNTER — Other Ambulatory Visit: Payer: Self-pay

## 2023-02-27 ENCOUNTER — Ambulatory Visit (INDEPENDENT_AMBULATORY_CARE_PROVIDER_SITE_OTHER): Payer: Medicare HMO

## 2023-02-27 VITALS — BP 115/74 | HR 97 | Temp 97.7°F | Resp 18 | Ht 75.0 in | Wt 216.0 lb

## 2023-02-27 DIAGNOSIS — K279 Peptic ulcer, site unspecified, unspecified as acute or chronic, without hemorrhage or perforation: Secondary | ICD-10-CM

## 2023-02-27 DIAGNOSIS — D500 Iron deficiency anemia secondary to blood loss (chronic): Secondary | ICD-10-CM

## 2023-02-27 MED ORDER — SODIUM CHLORIDE 0.9 % IV SOLN
300.0000 mg | Freq: Once | INTRAVENOUS | Status: AC
  Administered 2023-02-27: 300 mg via INTRAVENOUS
  Filled 2023-02-27: qty 15

## 2023-02-27 MED ORDER — DIPHENHYDRAMINE HCL 25 MG PO CAPS
25.0000 mg | ORAL_CAPSULE | Freq: Once | ORAL | Status: AC
  Administered 2023-02-27: 25 mg via ORAL
  Filled 2023-02-27: qty 1

## 2023-02-27 MED ORDER — ACETAMINOPHEN 325 MG PO TABS
650.0000 mg | ORAL_TABLET | Freq: Once | ORAL | Status: AC
  Administered 2023-02-27: 650 mg via ORAL
  Filled 2023-02-27: qty 2

## 2023-02-27 NOTE — Progress Notes (Signed)
Diagnosis: Iron Deficiency Anemia  Provider:  Chilton Greathouse MD  Procedure: IV Infusion  IV Type: Peripheral, IV Location: R Forearm  Venofer (Iron Sucrose), Dose: 300 mg  Infusion Start Time: 1017  Infusion Stop Time: 1200  Post Infusion IV Care: Patient declined observation and Peripheral IV Discontinued  Discharge: Condition: Good, Destination: Home . AVS Declined  Performed by:  Garnette Czech, RN

## 2023-02-28 ENCOUNTER — Other Ambulatory Visit: Payer: Self-pay

## 2023-02-28 MED ORDER — INFLUENZA VAC A&B SURF ANT ADJ 0.5 ML IM SUSY
0.5000 mL | PREFILLED_SYRINGE | Freq: Once | INTRAMUSCULAR | 0 refills | Status: AC
  Filled 2023-02-28: qty 0.5, 1d supply, fill #0

## 2023-02-28 MED ORDER — COVID-19 MRNA VAC-TRIS(PFIZER) 30 MCG/0.3ML IM SUSY
0.3000 mL | PREFILLED_SYRINGE | Freq: Once | INTRAMUSCULAR | 0 refills | Status: AC
  Filled 2023-02-28: qty 0.3, 1d supply, fill #0

## 2023-03-04 ENCOUNTER — Telehealth: Payer: Self-pay

## 2023-03-04 NOTE — Telephone Encounter (Signed)
Notified the pt of message below. He verbalized understanding.   Maleak Brazzel M,CMA

## 2023-03-04 NOTE — Telephone Encounter (Signed)
-----   Message from [facility] Ellin Mayhew sent at 03/04/2023 12:46 PM EDT -----  ----- Message ----- From: Pollyann Samples, NP Sent: 03/04/2023   8:13 AM EDT To: Verlee Rossetti, CMA; [facility]  Please let pt know iron has improved. Please keep monthly lab appts as scheduled, we will arrange additional IV Iron as needed.   Thanks, Clayborn Heron NP

## 2023-03-11 ENCOUNTER — Other Ambulatory Visit: Payer: Self-pay

## 2023-03-13 ENCOUNTER — Other Ambulatory Visit (HOSPITAL_COMMUNITY): Payer: Self-pay

## 2023-03-14 ENCOUNTER — Other Ambulatory Visit: Payer: Self-pay

## 2023-03-20 ENCOUNTER — Other Ambulatory Visit (HOSPITAL_COMMUNITY): Payer: Self-pay

## 2023-03-20 ENCOUNTER — Encounter: Payer: Self-pay | Admitting: Pharmacist

## 2023-03-20 NOTE — Progress Notes (Signed)
Pharmacy Quality Measure Review  This patient is appearing on a report for being at risk of failing the adherence measure for cholesterol (statin) medications this calendar year.   Medication: atorvastatin Last fill date: 01/30/23 for 90 day supply  Insurance report was not up to date. No action needed at this time.   Butch Penny, PharmD, Patsy [place], CPP Clinical Pharmacist [facility] [phone]

## 2023-03-20 NOTE — Progress Notes (Unsigned)
Cardiology Office Note   Date:  03/20/2023   ID:  Jared [name], DOB 02-08-1956, MRN [id number]  PCP:  [name], MD  Cardiologist:   Dietrich Pates, MD   Patient presents for f/u of CHF and atrial flutter      History of Present Illness: Jared [name] is a 67 y.o. male with a history of  CAD (mild), chronic combined systolic and diastolic heart failure,  CVA, hypertension, hyperlipidemia, paroxysmal atrial flutter on Xarelto, COPD (on chronic oxygen) and tobacco abuse. EF 30-35% in 2018, up to 45-50% by echo in 04/2018.  I last saw the pt in clinic in Aug 2022   The pt was recently admitted for COPD exacerbation   Echo done   LVEF unchanged from previous  The pt denies CP   Breathing is better since prior to admit      Diet:    Breakfast:  COrn flakes    Dinner   Spaghetti, brocolli   Drinks:   Grape juice   Diet orange soda  I saw the pt in clinic in Aug 2023    No outpatient medications have been marked as taking for the 03/21/23 encounter (Appointment) with Pricilla Riffle, MD.     Allergies:   Lisinopril   Past Medical History:  Diagnosis Date   AVM (arteriovenous malformation)    CAD (coronary artery disease)    a. LHC 5/12:  LAD 20, pLCx 20, pRCA 40, dRCA 40, EF 35%, diff HK  //  b. Myoview 4/16: Overall Impression:  High risk stress nuclear study There is no evidence of ischemia.  There is severe LV dysfunction. LV Ejection Fraction: 30%.  LV Wall Motion:  There is global LV hypokinesis.     CAP (community acquired pneumonia) 09/2013   Chronic combined systolic and diastolic CHF (congestive heart failure) (HCC)    a. Echo 4/16:Mild LVH, EF 40-45%, diffuse HK //  b. Echo 8/17: EF 35-40%, diffuse HK, diastolic dysfunction, aortic sclerosis, trivial MR, moderate LAE, normal RVSF, moderate RAE, mild TR, PASP 42 mmHg // c. Echo 4/18: Mild concentric LVH, EF 30-35, normal wall motion, grade 1 diastolic dysfunction, PASP 49   Chronic respiratory failure (HCC)    Cluster  headache    "hx; haven't had one in awhile" (01/09/2016)   COPD (chronic obstructive pulmonary disease) (HCC)    [name] 01/09/2016   DM (diabetes mellitus) (HCC)    History of CVA (cerebrovascular accident)    Hypertension    IDA (iron deficiency anemia)    Moderate tobacco use disorder    NICM (nonischemic cardiomyopathy) (HCC)    Nicotine addiction    Tobacco abuse    Type 2 diabetes mellitus (HCC) 05/14/2016    Past Surgical History:  Procedure Laterality Date   ABDOMINAL AORTOGRAM W/LOWER EXTREMITY N/A 12/07/2022   Procedure: ABDOMINAL AORTOGRAM W/LOWER EXTREMITY;  Surgeon: Leonie Douglas, MD;  [facility];  Service: Cardiovascular;  Laterality: N/A;   BIOPSY  11/12/2020   Procedure: BIOPSY;  Surgeon: Lemar Lofty., MD;  Location: [facility] ENDOSCOPY;  Service: Gastroenterology;;   CARDIAC CATHETERIZATION  10/2010   LM normal, LAD with 20% irregularities, LCX with 20%, RCA with 40% prox and 40% distal - EF of 35%   CATARACT EXTRACTION, BILATERAL     COLONOSCOPY W/ BIOPSIES AND POLYPECTOMY     COLONOSCOPY WITH PROPOFOL N/A 09/06/2018   Procedure: COLONOSCOPY WITH PROPOFOL;  Surgeon: Tressia Danas, MD;  Location: [facility] ENDOSCOPY;  Service: Gastroenterology;  Laterality: N/A;   ENTEROSCOPY N/A 09/28/2018   Procedure: ENTEROSCOPY;  Surgeon: Jeani Hawking, MD;  Location: [facility] ENDOSCOPY;  Service: Endoscopy;  Laterality: N/A;   ENTEROSCOPY N/A 10/28/2018   Procedure: ENTEROSCOPY;  Surgeon: Tressia Danas, MD;  Location: [facility] ENDOSCOPY;  Service: Gastroenterology;  Laterality: N/A;   ENTEROSCOPY N/A 10/09/2020   Procedure: ENTEROSCOPY;  Surgeon: Hilarie Fredrickson, MD;  Location: [facility] ENDOSCOPY;  Service: Endoscopy;  Laterality: N/A;   ENTEROSCOPY N/A 03/22/2022   Procedure: ENTEROSCOPY;  Surgeon: Benancio Deeds, MD;  Location: [facility] ENDOSCOPY;  Service: Gastroenterology;  Laterality: N/A;   ESOPHAGOGASTRODUODENOSCOPY N/A 11/12/2020   Procedure: ESOPHAGOGASTRODUODENOSCOPY (EGD);   Surgeon: Lemar Lofty., MD;  Location: [facility] ENDOS[facility];  Service: Gastroenterology;  Laterality: N/A;   ESOPHAGOGASTRODUODENOSCOPY (EGD) WITH PROPOFOL N/A 09/05/2018   Procedure: ESOPHAGOGASTRODUODENOSCOPY (EGD) WITH PROPOFOL;  Surgeon: Benancio Deeds, MD;  Location: [facility] ENDOSCOPY;  Service: Gastroenterology;  Laterality: N/A;   ESOPHAGOGASTRODUODENOSCOPY (EGD) WITH PROPOFOL N/A 11/19/2020   Procedure: ESOPHAGOGASTRODUODENOSCOPY (EGD) WITH PROPOFOL;  Surgeon: Beverley Fiedler, MD;  Location: [facility] ENDOSCOPY;  Service: Gastroenterology;  Laterality: N/A;   ESOPHAGOGASTRODUODENOSCOPY (EGD) WITH PROPOFOL N/A 12/27/2022   Procedure: ESOPHAGOGASTRODUODENOSCOPY (EGD) WITH PROPOFOL;  Surgeon: Napoleon Form, MD;  Location: [facility] ENDOSCOPY;  Service: Gastroenterology;  Laterality: N/A;   EXCISION MASS HEAD     GIVENS CAPSULE STUDY N/A 09/06/2018   Procedure: GIVENS CAPSULE STUDY;  Surgeon: Tressia Danas, MD;  Location: [facility] ENDOSCOPY;  Service: Gastroenterology;  Laterality: N/A;   GIVENS CAPSULE STUDY N/A 09/26/2018   Procedure: GIVENS CAPSULE STUDY;  Surgeon: Beverley Fiedler, MD;  Location: [facility] ENDOSCOPY;  Service: Gastroenterology;  Laterality: N/A;   GIVENS CAPSULE STUDY N/A 06/14/2019   Procedure: GIVENS CAPSULE STUDY;  Surgeon: Napoleon Form, MD;  Location[facility] ENDOSCOPY;  Service: Endoscopy;  Laterality: N/A;   HEMOSTASIS CLIP PLACEMENT  11/12/2020   Procedure: HEMOSTASIS CLIP PLACEMENT;  Surgeon: Lemar Lofty., MD;  Location: [facility] ENDOSCOPY;  Service: Gastroenterology;;   HEMOSTASIS CONTROL  11/12/2020   Procedure: HEMOSTASIS CONTROL;  Surgeon: Lemar Lofty., MD;  Location: [facility] ENDOSCOPY;  Service: Gastroenterology;;   HOT HEMOSTASIS N/A 10/28/2018   Procedure: HOT HEMOSTASIS (ARGON PLASMA COAGULATION/BICAP);  Surgeon: Tressia Danas, MD;  Location: [facility] ENDOSCOPY;  Service: Gastroenterology;  Laterality: N/A;   HOT HEMOSTASIS N/A 11/12/2020   Procedure: HOT HEMOSTASIS (ARGON  PLASMA COAGULATION/BICAP);  Surgeon: Lemar Lofty., MD;  Location: [facility] ENDOSCOPY;  Service: Gastroenterology;  Laterality: N/A;   HOT HEMOSTASIS N/A 03/22/2022   Procedure: HOT HEMOSTASIS (ARGON PLASMA COAGULATION/BICAP);  Surgeon: Benancio Deeds, MD;  Location: [facility] ENDOSCOPY;  Service: Gastroenterology;  Laterality: N/A;   INCISION AND DRAINAGE PERIRECTAL ABSCESS N/A 06/05/2017   Procedure: IRRIGATION AND DEBRIDEMENT PERIRECTAL ABSCESS;  Surgeon: Andria Meuse, MD;  Location: [facility] OR;  Service: General;  Laterality: N/A;   PERIPHERAL VASCULAR INTERVENTION  12/07/2022   Procedure: PERIPHERAL VASCULAR INTERVENTION;  Surgeon: Leonie Douglas, MD;  [facility];  Service: Cardiovascular;;   SUBMUCOSAL TATTOO INJECTION  11/12/2020   Procedure: SUBMUCOSAL TATTOO INJECTION;  Surgeon: Lemar Lofty., MD;  Location: [facility] ENDOSCOPY;  Service: Gastroenterology;;   VIDEO BRONCHOSCOPY Bilateral 05/08/2016   Procedure: VIDEO BRONCHOSCOPY WITH FLUORO;  Surgeon: Oretha Milch, MD;  Location: [facility] ENDOSCOPY;  Service: Cardiopulmonary;  Laterality: Bilateral;     Social History:  The patient  reports that he has been smoking cigarettes. He has a 23.5 pack-year smoking history. He has never used smokeless tobacco. He  reports that he does not currently use alcohol. He reports that he does not use drugs.   Family History:  The patient's family history includes Cancer in his father; Colon cancer in his mother; Diabetes in his brother, mother, and sister; Heart disease in his mother; Liver cancer in his mother.    ROS:  Please see the history of present illness. All other systems are reviewed and  Negative to the above problem except as noted.    PHYSICAL EXAM: VS:  There were no vitals taken for this visit.    GEN: Well nourished, well developed, HEENT: normal  Neck:JVP is normal  Cardiac: RRR; no murmurs  No  LE edema  Respiratory: Decreased airflow bilaterally.  No  rales    GI: soft   Nontender   MS: no deformity Moving all extremities   Skin: warm and dry, no rash Neuro:  Strength and sensation are intact Psych: euthymic mood, full affect   EKG:  EKG is not ordered today.  Echo:   July 2023 1. Left ventricular ejection fraction, by estimation, is 45 to 50%. The  left ventricle has mildly decreased function. The left ventricle  demonstrates global hypokinesis. There is mild concentric left ventricular  hypertrophy. Left ventricular diastolic  parameters are consistent with Grade I diastolic dysfunction (impaired  relaxation).   2. Right ventricular systolic function is normal. The right ventricular  size is normal.   3. The mitral valve is normal in structure. No evidence of mitral valve  regurgitation. No evidence of mitral stenosis.   4. The aortic valve is normal in structure. Aortic valve regurgitation is  not visualized. Aortic valve sclerosis/calcification is present, without  any evidence of aortic stenosis.   5. The inferior vena cava is normal in size with greater than 50%  respiratory variability, suggesting right atrial pressure of 3 mmHg.  Lipid Panel    Component Value Date/Time   CHOL 127 10/25/2022 1027   TRIG 80 10/25/2022 1027   HDL 49 10/25/2022 1027   CHOLHDL 2.9 01/29/2020 1020   CHOLHDL 2.8 01/11/2016 0446   VLDL 14 01/11/2016 0446   LDLCALC 62 10/25/2022 1027      Wt Readings from Last 3 Encounters:  02/27/23 216 lb (98 kg)  02/20/23 215 lb 9.6 oz (97.8 kg)  02/13/23 216 lb 9.6 oz (98.2 kg)      ASSESSMENT AND PLAN:  1  CAD   Pt denies CP  2  HFmrEF   Recent echo LVEF 45 to 50%   Volume status appears OK   Follow   3  COPD   Pt on chronic O2   Back to baseline of 2 L       4   Atrial flutter.    Paroxysmal  Continue Xarelto.  Would recomm tele     5  hypertension. BP is excellent   He denies dizzienss    Follow    6.  Dyslipidemia.  Contiue lipitor  Add Zetia  LDL currently 79   Repeat lipomed  and liver panel in 8 wks     7.  History of CVA.  Continue Xarelto     8   Anemia   Last Hgb 15     9  GI   Hx of bleeding    Current medicines are reviewed at length with the patient today.  The patient does not have concerns regarding medicines.  Signed, Dietrich Pates, MD  03/20/2023 3:13 PM    [facility] [address] [place], [place], [place]  [address] Phone: [phone]; Fax: [phone]

## 2023-03-21 ENCOUNTER — Encounter: Payer: Self-pay | Admitting: Internal Medicine

## 2023-03-21 ENCOUNTER — Ambulatory Visit: Payer: Medicare HMO | Attending: Internal Medicine | Admitting: Internal Medicine

## 2023-03-21 VITALS — BP 110/60 | HR 97 | Ht 75.0 in | Wt 219.0 lb

## 2023-03-21 DIAGNOSIS — I2510 Atherosclerotic heart disease of native coronary artery without angina pectoris: Secondary | ICD-10-CM

## 2023-03-21 NOTE — Patient Instructions (Signed)
Medication Instructions:   *If you need a refill on your cardiac medications before your next appointment, please call your pharmacy*   Lab Work:  If you have labs (blood work) drawn today and your tests are completely normal, you will receive your results only by: MyChart Message (if you have MyChart) OR A paper copy in the mail If you have any lab test that is abnormal or we need to change your treatment, we will call you to review the results.   Testing/Procedures:    Follow-Up: At [facility], you and your health needs are our priority.  As part of our continuing mission to provide you with exceptional heart care, we have created designated Provider Care Teams.  These Care Teams include your primary Cardiologist (physician) and Advanced Practice Providers (APPs -  Physician Assistants and Nurse Practitioners) who all work together to provide you with the care you need, when you need it.  We recommend signing up for the patient portal called "MyChart".  Sign up information is provided on this After Visit Summary.  MyChart is used to connect with patients for Virtual Visits (Telemedicine).  Patients are able to view lab/test results, encounter notes, upcoming appointments, etc.  Non-urgent messages can be sent to your provider as well.   To learn more about what you can do with MyChart, go to [email or web address].    Your next appointment:  MAY 2025

## 2023-03-25 ENCOUNTER — Inpatient Hospital Stay: Payer: Medicare HMO | Attending: Nurse Practitioner

## 2023-03-25 DIAGNOSIS — D500 Iron deficiency anemia secondary to blood loss (chronic): Secondary | ICD-10-CM | POA: Diagnosis not present

## 2023-03-25 DIAGNOSIS — K270 Acute peptic ulcer, site unspecified, with hemorrhage: Secondary | ICD-10-CM | POA: Insufficient documentation

## 2023-03-25 LAB — CBC WITH DIFFERENTIAL (CANCER CENTER ONLY)
Abs Immature Granulocytes: 0.02 10*3/uL (ref 0.00–0.07)
Basophils Absolute: 0 10*3/uL (ref 0.0–0.1)
Basophils Relative: 1 %
Eosinophils Absolute: 0.1 10*3/uL (ref 0.0–0.5)
Eosinophils Relative: 1 %
HCT: 36.8 % — ABNORMAL LOW (ref 39.0–52.0)
Hemoglobin: 10.7 g/dL — ABNORMAL LOW (ref 13.0–17.0)
Immature Granulocytes: 0 %
Lymphocytes Relative: 23 %
Lymphs Abs: 1.4 10*3/uL (ref 0.7–4.0)
MCH: 27.1 pg (ref 26.0–34.0)
MCHC: 29.1 g/dL — ABNORMAL LOW (ref 30.0–36.0)
MCV: 93.2 fL (ref 80.0–100.0)
Monocytes Absolute: 0.5 10*3/uL (ref 0.1–1.0)
Monocytes Relative: 8 %
Neutro Abs: 4.1 10*3/uL (ref 1.7–7.7)
Neutrophils Relative %: 67 %
Platelet Count: 292 10*3/uL (ref 150–400)
RBC: 3.95 MIL/uL — ABNORMAL LOW (ref 4.22–5.81)
RDW: 14.8 % (ref 11.5–15.5)
WBC Count: 6.1 10*3/uL (ref 4.0–10.5)
nRBC: 0 % (ref 0.0–0.2)

## 2023-03-25 LAB — IRON AND IRON BINDING CAPACITY (CC-WL,HP ONLY)
Iron: 25 ug/dL — ABNORMAL LOW (ref 45–182)
Saturation Ratios: 7 % — ABNORMAL LOW (ref 17.9–39.5)
TIBC: 344 ug/dL (ref 250–450)
UIBC: 319 ug/dL (ref 117–376)

## 2023-03-25 LAB — FERRITIN: Ferritin: 22 ng/mL — ABNORMAL LOW (ref 24–336)

## 2023-04-08 ENCOUNTER — Other Ambulatory Visit: Payer: Self-pay | Admitting: Nurse Practitioner

## 2023-04-09 ENCOUNTER — Other Ambulatory Visit: Payer: Self-pay

## 2023-04-09 ENCOUNTER — Encounter: Payer: Self-pay | Admitting: Nurse Practitioner

## 2023-04-09 ENCOUNTER — Telehealth: Payer: Self-pay

## 2023-04-09 NOTE — Telephone Encounter (Addendum)
Contacted pt made pt aware of message below. Pt verbalized and understanding.  ----- Message from Pollyann Samples sent at 04/08/2023  3:59 PM EDT ----- His ferritin dropped below goal, I have increased IV Venofer to 500 mg weekly x2. Please help to get scheduled here or [facility].  Thanks, Clayborn Heron NP

## 2023-04-09 NOTE — Telephone Encounter (Signed)
Jared [name], patient will be scheduled as soon as possible.  Auth Submission: NO AUTH NEEDED Site of care: Site of care: [facility] Payer: [employer] and [place] Medicaid Medication & CPT/J Code(s) submitted: Venofer (Iron Sucrose) J1756 Route of submission (phone, fax, portal):  Phone # Fax # Auth type: Buy/Bill PB Units/visits requested: 200mg  x 5 doses Reference number:  Approval from: 04/09/23 to 06/11/23

## 2023-04-11 ENCOUNTER — Other Ambulatory Visit: Payer: Self-pay

## 2023-04-17 ENCOUNTER — Ambulatory Visit: Payer: Medicare HMO

## 2023-04-17 VITALS — BP 111/70 | HR 90 | Temp 98.0°F | Resp 18 | Ht 75.0 in | Wt 218.4 lb

## 2023-04-17 DIAGNOSIS — D500 Iron deficiency anemia secondary to blood loss (chronic): Secondary | ICD-10-CM

## 2023-04-17 DIAGNOSIS — K279 Peptic ulcer, site unspecified, unspecified as acute or chronic, without hemorrhage or perforation: Secondary | ICD-10-CM

## 2023-04-17 MED ORDER — ACETAMINOPHEN 325 MG PO TABS
650.0000 mg | ORAL_TABLET | Freq: Once | ORAL | Status: AC
Start: 2023-04-17 — End: 2023-04-17
  Administered 2023-04-17: 650 mg via ORAL
  Filled 2023-04-17: qty 2

## 2023-04-17 MED ORDER — IRON SUCROSE 20 MG/ML IV SOLN
200.0000 mg | Freq: Once | INTRAVENOUS | Status: AC
  Administered 2023-04-17: 200 mg via INTRAVENOUS
  Filled 2023-04-17: qty 10

## 2023-04-17 MED ORDER — DIPHENHYDRAMINE HCL 25 MG PO CAPS
25.0000 mg | ORAL_CAPSULE | Freq: Once | ORAL | Status: AC
  Administered 2023-04-17: 25 mg via ORAL
  Filled 2023-04-17: qty 1

## 2023-04-17 NOTE — Progress Notes (Deleted)
Diagnosis: Iron Deficiency Anemia  Provider:  Chilton Greathouse MD  Procedure: IV Infusion  IV Type: Peripheral, IV Location: L Hand  Venofer (Iron Sucrose), Dose: 200 mg  {Infusion Start Time:25399}  {Infusion Stop Time:25400}  Post Infusion IV Care: {[facility] Post Infusion:25398}  Discharge: {Condition:19696:::1}, {Destination:18313::"Home":1} . {CHINFAVS:28985}  Performed by:  Rico Ala, LPN

## 2023-04-17 NOTE — Progress Notes (Signed)
Diagnosis: Iron Deficiency Anemia  Provider:  Chilton Greathouse MD  Procedure: IV Push  IV Type: Peripheral, IV Location: L Hand  Venofer (Iron Sucrose), Dose: 200 mg  Post Infusion IV Care: Peripheral IV Discontinued  Discharge: Condition: Good, Destination: Home . AVS Provided  Performed by:  Rico Ala, LPN

## 2023-04-22 ENCOUNTER — Encounter: Payer: Self-pay | Admitting: Nurse Practitioner

## 2023-04-22 ENCOUNTER — Inpatient Hospital Stay: Payer: Medicare HMO | Attending: Nurse Practitioner

## 2023-04-22 DIAGNOSIS — K270 Acute peptic ulcer, site unspecified, with hemorrhage: Secondary | ICD-10-CM | POA: Insufficient documentation

## 2023-04-22 DIAGNOSIS — D500 Iron deficiency anemia secondary to blood loss (chronic): Secondary | ICD-10-CM | POA: Insufficient documentation

## 2023-04-22 LAB — CBC WITH DIFFERENTIAL (CANCER CENTER ONLY)
Abs Immature Granulocytes: 0.03 10*3/uL (ref 0.00–0.07)
Basophils Absolute: 0 10*3/uL (ref 0.0–0.1)
Basophils Relative: 0 %
Eosinophils Absolute: 0.2 10*3/uL (ref 0.0–0.5)
Eosinophils Relative: 2 %
HCT: 36.1 % — ABNORMAL LOW (ref 39.0–52.0)
Hemoglobin: 10.6 g/dL — ABNORMAL LOW (ref 13.0–17.0)
Immature Granulocytes: 0 %
Lymphocytes Relative: 18 %
Lymphs Abs: 1.5 10*3/uL (ref 0.7–4.0)
MCH: 27.5 pg (ref 26.0–34.0)
MCHC: 29.4 g/dL — ABNORMAL LOW (ref 30.0–36.0)
MCV: 93.5 fL (ref 80.0–100.0)
Monocytes Absolute: 0.7 10*3/uL (ref 0.1–1.0)
Monocytes Relative: 8 %
Neutro Abs: 6.2 10*3/uL (ref 1.7–7.7)
Neutrophils Relative %: 72 %
Platelet Count: 242 10*3/uL (ref 150–400)
RBC: 3.86 MIL/uL — ABNORMAL LOW (ref 4.22–5.81)
RDW: 16.1 % — ABNORMAL HIGH (ref 11.5–15.5)
WBC Count: 8.6 10*3/uL (ref 4.0–10.5)
nRBC: 0 % (ref 0.0–0.2)

## 2023-04-22 LAB — FERRITIN: Ferritin: 38 ng/mL (ref 24–336)

## 2023-04-22 LAB — IRON AND IRON BINDING CAPACITY (CC-WL,HP ONLY)
Iron: 64 ug/dL (ref 45–182)
Saturation Ratios: 18 % (ref 17.9–39.5)
TIBC: 350 ug/dL (ref 250–450)
UIBC: 286 ug/dL (ref 117–376)

## 2023-04-24 ENCOUNTER — Ambulatory Visit: Payer: Medicare HMO

## 2023-04-24 VITALS — BP 130/79 | HR 110 | Temp 98.2°F | Resp 20 | Ht 75.0 in | Wt 219.8 lb

## 2023-04-24 DIAGNOSIS — D500 Iron deficiency anemia secondary to blood loss (chronic): Secondary | ICD-10-CM

## 2023-04-24 DIAGNOSIS — K279 Peptic ulcer, site unspecified, unspecified as acute or chronic, without hemorrhage or perforation: Secondary | ICD-10-CM

## 2023-04-24 MED ORDER — IRON SUCROSE 20 MG/ML IV SOLN
200.0000 mg | Freq: Once | INTRAVENOUS | Status: AC
  Administered 2023-04-24: 200 mg via INTRAVENOUS
  Filled 2023-04-24: qty 10

## 2023-04-24 MED ORDER — DIPHENHYDRAMINE HCL 25 MG PO CAPS
25.0000 mg | ORAL_CAPSULE | Freq: Once | ORAL | Status: AC
Start: 2023-04-24 — End: 2023-04-24
  Administered 2023-04-24: 25 mg via ORAL
  Filled 2023-04-24: qty 1

## 2023-04-24 MED ORDER — ACETAMINOPHEN 325 MG PO TABS
650.0000 mg | ORAL_TABLET | Freq: Once | ORAL | Status: AC
  Administered 2023-04-24: 650 mg via ORAL
  Filled 2023-04-24: qty 2

## 2023-04-24 NOTE — Progress Notes (Signed)
Diagnosis: Iron Deficiency Anemia  Provider:  Chilton Greathouse MD  Procedure: IV Push  IV Type: Peripheral, IV Location: L Hand  Venofer (Iron Sucrose), Dose: 200 mg  Post Infusion IV Care: Patient declined observation and Peripheral IV Discontinued  Discharge: Condition: Good, Destination: Home . AVS Declined  Performed by:  Rico Ala, LPN

## 2023-04-30 ENCOUNTER — Emergency Department (HOSPITAL_COMMUNITY): Payer: Medicare HMO

## 2023-04-30 ENCOUNTER — Encounter (HOSPITAL_COMMUNITY): Payer: Self-pay | Admitting: Family Medicine

## 2023-04-30 ENCOUNTER — Other Ambulatory Visit: Payer: Self-pay

## 2023-04-30 ENCOUNTER — Inpatient Hospital Stay (HOSPITAL_COMMUNITY)
Admission: EM | Admit: 2023-04-30 | Discharge: 2023-05-21 | DRG: 853 | Disposition: A | Discharge status: Skilled Nursing Facility | Payer: Medicare HMO | Attending: Internal Medicine | Admitting: Internal Medicine

## 2023-04-30 DIAGNOSIS — Z560 Unemployment, unspecified: Secondary | ICD-10-CM

## 2023-04-30 DIAGNOSIS — R062 Wheezing: Secondary | ICD-10-CM | POA: Diagnosis not present

## 2023-04-30 DIAGNOSIS — K3532 Acute appendicitis with perforation and localized peritonitis, without abscess: Secondary | ICD-10-CM | POA: Diagnosis not present

## 2023-04-30 DIAGNOSIS — R251 Tremor, unspecified: Secondary | ICD-10-CM | POA: Diagnosis not present

## 2023-04-30 DIAGNOSIS — E876 Hypokalemia: Secondary | ICD-10-CM | POA: Diagnosis not present

## 2023-04-30 DIAGNOSIS — K250 Acute gastric ulcer with hemorrhage: Secondary | ICD-10-CM

## 2023-04-30 DIAGNOSIS — I472 Ventricular tachycardia, unspecified: Secondary | ICD-10-CM | POA: Diagnosis not present

## 2023-04-30 DIAGNOSIS — L0231 Cutaneous abscess of buttock: Secondary | ICD-10-CM | POA: Diagnosis not present

## 2023-04-30 DIAGNOSIS — R5381 Other malaise: Secondary | ICD-10-CM | POA: Diagnosis present

## 2023-04-30 DIAGNOSIS — D75839 Thrombocytosis, unspecified: Secondary | ICD-10-CM | POA: Diagnosis not present

## 2023-04-30 DIAGNOSIS — R55 Syncope and collapse: Secondary | ICD-10-CM

## 2023-04-30 DIAGNOSIS — J9611 Chronic respiratory failure with hypoxia: Secondary | ICD-10-CM

## 2023-04-30 DIAGNOSIS — I5030 Unspecified diastolic (congestive) heart failure: Secondary | ICD-10-CM | POA: Diagnosis not present

## 2023-04-30 DIAGNOSIS — N50812 Left testicular pain: Secondary | ICD-10-CM | POA: Diagnosis not present

## 2023-04-30 DIAGNOSIS — I2510 Atherosclerotic heart disease of native coronary artery without angina pectoris: Secondary | ICD-10-CM | POA: Diagnosis not present

## 2023-04-30 DIAGNOSIS — I110 Hypertensive heart disease with heart failure: Secondary | ICD-10-CM | POA: Diagnosis present

## 2023-04-30 DIAGNOSIS — D509 Iron deficiency anemia, unspecified: Secondary | ICD-10-CM | POA: Diagnosis present

## 2023-04-30 DIAGNOSIS — N503 Cyst of epididymis: Secondary | ICD-10-CM | POA: Diagnosis not present

## 2023-04-30 DIAGNOSIS — I480 Paroxysmal atrial fibrillation: Secondary | ICD-10-CM | POA: Diagnosis present

## 2023-04-30 DIAGNOSIS — E872 Acidosis, unspecified: Secondary | ICD-10-CM | POA: Diagnosis present

## 2023-04-30 DIAGNOSIS — N179 Acute kidney failure, unspecified: Secondary | ICD-10-CM

## 2023-04-30 DIAGNOSIS — J811 Chronic pulmonary edema: Secondary | ICD-10-CM | POA: Diagnosis not present

## 2023-04-30 DIAGNOSIS — M7989 Other specified soft tissue disorders: Secondary | ICD-10-CM | POA: Diagnosis not present

## 2023-04-30 DIAGNOSIS — G238 Other specified degenerative diseases of basal ganglia: Secondary | ICD-10-CM | POA: Diagnosis not present

## 2023-04-30 DIAGNOSIS — K651 Peritoneal abscess: Secondary | ICD-10-CM | POA: Diagnosis not present

## 2023-04-30 DIAGNOSIS — R569 Unspecified convulsions: Secondary | ICD-10-CM | POA: Diagnosis not present

## 2023-04-30 DIAGNOSIS — E669 Obesity, unspecified: Secondary | ICD-10-CM | POA: Diagnosis present

## 2023-04-30 DIAGNOSIS — F39 Unspecified mood [affective] disorder: Secondary | ICD-10-CM | POA: Diagnosis not present

## 2023-04-30 DIAGNOSIS — E871 Hypo-osmolality and hyponatremia: Secondary | ICD-10-CM | POA: Diagnosis not present

## 2023-04-30 DIAGNOSIS — I4719 Other supraventricular tachycardia: Secondary | ICD-10-CM | POA: Diagnosis not present

## 2023-04-30 DIAGNOSIS — T8143XA Infection following a procedure, organ and space surgical site, initial encounter: Secondary | ICD-10-CM | POA: Diagnosis not present

## 2023-04-30 DIAGNOSIS — S2242XA Multiple fractures of ribs, left side, initial encounter for closed fracture: Secondary | ICD-10-CM | POA: Diagnosis not present

## 2023-04-30 DIAGNOSIS — R0989 Other specified symptoms and signs involving the circulatory and respiratory systems: Secondary | ICD-10-CM | POA: Diagnosis not present

## 2023-04-30 DIAGNOSIS — K31811 Angiodysplasia of stomach and duodenum with bleeding: Secondary | ICD-10-CM | POA: Diagnosis not present

## 2023-04-30 DIAGNOSIS — K659 Peritonitis, unspecified: Secondary | ICD-10-CM | POA: Diagnosis not present

## 2023-04-30 DIAGNOSIS — K31819 Angiodysplasia of stomach and duodenum without bleeding: Secondary | ICD-10-CM | POA: Diagnosis not present

## 2023-04-30 DIAGNOSIS — I4892 Unspecified atrial flutter: Secondary | ICD-10-CM | POA: Diagnosis not present

## 2023-04-30 DIAGNOSIS — D500 Iron deficiency anemia secondary to blood loss (chronic): Secondary | ICD-10-CM | POA: Diagnosis not present

## 2023-04-30 DIAGNOSIS — F17210 Nicotine dependence, cigarettes, uncomplicated: Secondary | ICD-10-CM | POA: Diagnosis present

## 2023-04-30 DIAGNOSIS — R7989 Other specified abnormal findings of blood chemistry: Secondary | ICD-10-CM | POA: Diagnosis present

## 2023-04-30 DIAGNOSIS — Z1152 Encounter for screening for COVID-19: Secondary | ICD-10-CM | POA: Diagnosis not present

## 2023-04-30 DIAGNOSIS — Z95828 Presence of other vascular implants and grafts: Secondary | ICD-10-CM | POA: Diagnosis not present

## 2023-04-30 DIAGNOSIS — A419 Sepsis, unspecified organism: Secondary | ICD-10-CM | POA: Diagnosis not present

## 2023-04-30 DIAGNOSIS — E785 Hyperlipidemia, unspecified: Secondary | ICD-10-CM | POA: Diagnosis present

## 2023-04-30 DIAGNOSIS — I82220 Acute embolism and thrombosis of inferior vena cava: Secondary | ICD-10-CM | POA: Diagnosis not present

## 2023-04-30 DIAGNOSIS — J9622 Acute and chronic respiratory failure with hypercapnia: Secondary | ICD-10-CM | POA: Diagnosis not present

## 2023-04-30 DIAGNOSIS — I4891 Unspecified atrial fibrillation: Secondary | ICD-10-CM | POA: Diagnosis not present

## 2023-04-30 DIAGNOSIS — Z7901 Long term (current) use of anticoagulants: Secondary | ICD-10-CM

## 2023-04-30 DIAGNOSIS — K3533 Acute appendicitis with perforation and localized peritonitis, with abscess: Secondary | ICD-10-CM | POA: Diagnosis not present

## 2023-04-30 DIAGNOSIS — Y838 Other surgical procedures as the cause of abnormal reaction of the patient, or of later complication, without mention of misadventure at the time of the procedure: Secondary | ICD-10-CM | POA: Diagnosis not present

## 2023-04-30 DIAGNOSIS — B961 Klebsiella pneumoniae [K. pneumoniae] as the cause of diseases classified elsewhere: Secondary | ICD-10-CM | POA: Diagnosis present

## 2023-04-30 DIAGNOSIS — J9621 Acute and chronic respiratory failure with hypoxia: Secondary | ICD-10-CM | POA: Diagnosis not present

## 2023-04-30 DIAGNOSIS — E440 Moderate protein-calorie malnutrition: Secondary | ICD-10-CM | POA: Diagnosis not present

## 2023-04-30 DIAGNOSIS — I739 Peripheral vascular disease, unspecified: Secondary | ICD-10-CM | POA: Diagnosis not present

## 2023-04-30 DIAGNOSIS — Z5986 Financial insecurity: Secondary | ICD-10-CM

## 2023-04-30 DIAGNOSIS — J441 Chronic obstructive pulmonary disease with (acute) exacerbation: Secondary | ICD-10-CM | POA: Diagnosis not present

## 2023-04-30 DIAGNOSIS — R918 Other nonspecific abnormal finding of lung field: Secondary | ICD-10-CM | POA: Diagnosis not present

## 2023-04-30 DIAGNOSIS — R195 Other fecal abnormalities: Secondary | ICD-10-CM

## 2023-04-30 DIAGNOSIS — I5032 Chronic diastolic (congestive) heart failure: Secondary | ICD-10-CM

## 2023-04-30 DIAGNOSIS — J988 Other specified respiratory disorders: Secondary | ICD-10-CM | POA: Diagnosis not present

## 2023-04-30 DIAGNOSIS — R0600 Dyspnea, unspecified: Secondary | ICD-10-CM | POA: Diagnosis not present

## 2023-04-30 DIAGNOSIS — I5033 Acute on chronic diastolic (congestive) heart failure: Secondary | ICD-10-CM | POA: Diagnosis not present

## 2023-04-30 DIAGNOSIS — K37 Unspecified appendicitis: Secondary | ICD-10-CM | POA: Diagnosis not present

## 2023-04-30 DIAGNOSIS — Z0389 Encounter for observation for other suspected diseases and conditions ruled out: Secondary | ICD-10-CM | POA: Diagnosis not present

## 2023-04-30 DIAGNOSIS — I5021 Acute systolic (congestive) heart failure: Secondary | ICD-10-CM | POA: Diagnosis not present

## 2023-04-30 DIAGNOSIS — Z888 Allergy status to other drugs, medicaments and biological substances status: Secondary | ICD-10-CM

## 2023-04-30 DIAGNOSIS — R6520 Severe sepsis without septic shock: Secondary | ICD-10-CM | POA: Diagnosis not present

## 2023-04-30 DIAGNOSIS — Z6830 Body mass index (BMI) 30.0-30.9, adult: Secondary | ICD-10-CM

## 2023-04-30 DIAGNOSIS — R0602 Shortness of breath: Secondary | ICD-10-CM | POA: Diagnosis not present

## 2023-04-30 DIAGNOSIS — Z7982 Long term (current) use of aspirin: Secondary | ICD-10-CM

## 2023-04-30 DIAGNOSIS — G40909 Epilepsy, unspecified, not intractable, without status epilepticus: Secondary | ICD-10-CM | POA: Diagnosis present

## 2023-04-30 DIAGNOSIS — Z8249 Family history of ischemic heart disease and other diseases of the circulatory system: Secondary | ICD-10-CM

## 2023-04-30 DIAGNOSIS — I5020 Unspecified systolic (congestive) heart failure: Secondary | ICD-10-CM | POA: Diagnosis not present

## 2023-04-30 DIAGNOSIS — I4729 Other ventricular tachycardia: Secondary | ICD-10-CM

## 2023-04-30 DIAGNOSIS — F05 Delirium due to known physiological condition: Secondary | ICD-10-CM | POA: Diagnosis not present

## 2023-04-30 DIAGNOSIS — T8141XA Infection following a procedure, superficial incisional surgical site, initial encounter: Secondary | ICD-10-CM | POA: Diagnosis not present

## 2023-04-30 DIAGNOSIS — Z79899 Other long term (current) drug therapy: Secondary | ICD-10-CM

## 2023-04-30 DIAGNOSIS — Z4682 Encounter for fitting and adjustment of non-vascular catheter: Secondary | ICD-10-CM | POA: Diagnosis not present

## 2023-04-30 DIAGNOSIS — I50810 Right heart failure, unspecified: Secondary | ICD-10-CM | POA: Diagnosis not present

## 2023-04-30 DIAGNOSIS — Z751 Person awaiting admission to adequate facility elsewhere: Secondary | ICD-10-CM

## 2023-04-30 DIAGNOSIS — K921 Melena: Secondary | ICD-10-CM | POA: Diagnosis not present

## 2023-04-30 DIAGNOSIS — I509 Heart failure, unspecified: Secondary | ICD-10-CM | POA: Diagnosis not present

## 2023-04-30 DIAGNOSIS — D62 Acute posthemorrhagic anemia: Secondary | ICD-10-CM

## 2023-04-30 DIAGNOSIS — R931 Abnormal findings on diagnostic imaging of heart and coronary circulation: Secondary | ICD-10-CM

## 2023-04-30 DIAGNOSIS — F32A Depression, unspecified: Secondary | ICD-10-CM | POA: Diagnosis present

## 2023-04-30 DIAGNOSIS — Z833 Family history of diabetes mellitus: Secondary | ICD-10-CM

## 2023-04-30 DIAGNOSIS — Z9981 Dependence on supplemental oxygen: Secondary | ICD-10-CM

## 2023-04-30 DIAGNOSIS — F17200 Nicotine dependence, unspecified, uncomplicated: Secondary | ICD-10-CM | POA: Diagnosis not present

## 2023-04-30 DIAGNOSIS — R109 Unspecified abdominal pain: Secondary | ICD-10-CM | POA: Diagnosis not present

## 2023-04-30 DIAGNOSIS — B9620 Unspecified Escherichia coli [E. coli] as the cause of diseases classified elsewhere: Secondary | ICD-10-CM | POA: Diagnosis present

## 2023-04-30 DIAGNOSIS — F329 Major depressive disorder, single episode, unspecified: Secondary | ICD-10-CM | POA: Diagnosis not present

## 2023-04-30 DIAGNOSIS — K35201 Acute appendicitis with generalized peritonitis, with perforation, without abscess: Secondary | ICD-10-CM | POA: Diagnosis not present

## 2023-04-30 DIAGNOSIS — Z720 Tobacco use: Secondary | ICD-10-CM | POA: Diagnosis not present

## 2023-04-30 DIAGNOSIS — J449 Chronic obstructive pulmonary disease, unspecified: Secondary | ICD-10-CM | POA: Diagnosis present

## 2023-04-30 DIAGNOSIS — Z8673 Personal history of transient ischemic attack (TIA), and cerebral infarction without residual deficits: Secondary | ICD-10-CM | POA: Diagnosis not present

## 2023-04-30 DIAGNOSIS — K560 Paralytic ileus: Secondary | ICD-10-CM | POA: Diagnosis not present

## 2023-04-30 DIAGNOSIS — Z7951 Long term (current) use of inhaled steroids: Secondary | ICD-10-CM

## 2023-04-30 DIAGNOSIS — I428 Other cardiomyopathies: Secondary | ICD-10-CM | POA: Diagnosis present

## 2023-04-30 DIAGNOSIS — J9811 Atelectasis: Secondary | ICD-10-CM | POA: Diagnosis not present

## 2023-04-30 DIAGNOSIS — R0981 Nasal congestion: Secondary | ICD-10-CM | POA: Diagnosis not present

## 2023-04-30 DIAGNOSIS — R519 Headache, unspecified: Secondary | ICD-10-CM | POA: Diagnosis not present

## 2023-04-30 DIAGNOSIS — Z7401 Bed confinement status: Secondary | ICD-10-CM | POA: Diagnosis not present

## 2023-04-30 DIAGNOSIS — K5520 Angiodysplasia of colon without hemorrhage: Secondary | ICD-10-CM | POA: Diagnosis not present

## 2023-04-30 DIAGNOSIS — E1151 Type 2 diabetes mellitus with diabetic peripheral angiopathy without gangrene: Secondary | ICD-10-CM | POA: Diagnosis present

## 2023-04-30 DIAGNOSIS — I10 Essential (primary) hypertension: Secondary | ICD-10-CM | POA: Diagnosis not present

## 2023-04-30 DIAGNOSIS — R1031 Right lower quadrant pain: Secondary | ICD-10-CM | POA: Diagnosis not present

## 2023-04-30 DIAGNOSIS — R278 Other lack of coordination: Secondary | ICD-10-CM | POA: Diagnosis not present

## 2023-04-30 DIAGNOSIS — I5042 Chronic combined systolic (congestive) and diastolic (congestive) heart failure: Secondary | ICD-10-CM | POA: Diagnosis not present

## 2023-04-30 DIAGNOSIS — K353 Acute appendicitis with localized peritonitis, without perforation or gangrene: Secondary | ICD-10-CM | POA: Diagnosis not present

## 2023-04-30 HISTORY — DX: Acute appendicitis with perforation, localized peritonitis, and gangrene, without abscess: K35.32

## 2023-04-30 LAB — URINALYSIS, ROUTINE W REFLEX MICROSCOPIC
Bilirubin Urine: NEGATIVE
Glucose, UA: NEGATIVE mg/dL
Hgb urine dipstick: NEGATIVE
Ketones, ur: NEGATIVE mg/dL
Leukocytes,Ua: NEGATIVE
Nitrite: NEGATIVE
Protein, ur: NEGATIVE mg/dL
Specific Gravity, Urine: 1.013 (ref 1.005–1.030)
pH: 5 (ref 5.0–8.0)

## 2023-04-30 LAB — BASIC METABOLIC PANEL
Anion gap: 10 (ref 5–15)
BUN: 12 mg/dL (ref 8–23)
CO2: 27 mmol/L (ref 22–32)
Calcium: 8.5 mg/dL — ABNORMAL LOW (ref 8.9–10.3)
Chloride: 98 mmol/L (ref 98–111)
Creatinine, Ser: 1.65 mg/dL — ABNORMAL HIGH (ref 0.61–1.24)
GFR, Estimated: 45 mL/min — ABNORMAL LOW (ref >60)
Glucose, Bld: 129 mg/dL — ABNORMAL HIGH (ref 70–99)
Potassium: 3.6 mmol/L (ref 3.5–5.1)
Sodium: 135 mmol/L (ref 135–145)

## 2023-04-30 LAB — CBC
HCT: 34.6 % — ABNORMAL LOW (ref 39.0–52.0)
Hemoglobin: 10.1 g/dL — ABNORMAL LOW (ref 13.0–17.0)
MCH: 26.5 pg (ref 26.0–34.0)
MCHC: 29.2 g/dL — ABNORMAL LOW (ref 30.0–36.0)
MCV: 90.8 fL (ref 80.0–100.0)
Platelets: 306 10*3/uL (ref 150–400)
RBC: 3.81 MIL/uL — ABNORMAL LOW (ref 4.22–5.81)
RDW: 15.6 % — ABNORMAL HIGH (ref 11.5–15.5)
WBC: 13.4 10*3/uL — ABNORMAL HIGH (ref 4.0–10.5)
nRBC: 0 % (ref 0.0–0.2)

## 2023-04-30 LAB — HEPATIC FUNCTION PANEL
ALT: 18 U/L (ref 0–44)
AST: 32 U/L (ref 15–41)
Albumin: 3.7 g/dL (ref 3.5–5.0)
Alkaline Phosphatase: 72 U/L (ref 38–126)
Bilirubin, Direct: 0.2 mg/dL (ref 0.0–0.2)
Indirect Bilirubin: 1.1 mg/dL — ABNORMAL HIGH (ref 0.3–0.9)
Total Bilirubin: 1.3 mg/dL — ABNORMAL HIGH (ref <1.2)
Total Protein: 7.3 g/dL (ref 6.5–8.1)

## 2023-04-30 LAB — PROTIME-INR
INR: 1.7 — ABNORMAL HIGH (ref 0.8–1.2)
Prothrombin Time: 20.4 s — ABNORMAL HIGH (ref 11.4–15.2)

## 2023-04-30 LAB — RESP PANEL BY RT-PCR (RSV, FLU A&B, COVID)  RVPGX2
Influenza A by PCR: NEGATIVE
Influenza B by PCR: NEGATIVE
Resp Syncytial Virus by PCR: NEGATIVE
SARS Coronavirus 2 by RT PCR: NEGATIVE

## 2023-04-30 LAB — URINALYSIS, W/ REFLEX TO CULTURE (INFECTION SUSPECTED)
Bacteria, UA: NONE SEEN
Bilirubin Urine: NEGATIVE
Glucose, UA: NEGATIVE mg/dL
Ketones, ur: NEGATIVE mg/dL
Leukocytes,Ua: NEGATIVE
Nitrite: NEGATIVE
Protein, ur: NEGATIVE mg/dL
Specific Gravity, Urine: 1.028 (ref 1.005–1.030)
pH: 5 (ref 5.0–8.0)

## 2023-04-30 LAB — RAPID URINE DRUG SCREEN, HOSP PERFORMED
Amphetamines: NOT DETECTED
Barbiturates: NOT DETECTED
Benzodiazepines: NOT DETECTED
Cocaine: NOT DETECTED
Opiates: POSITIVE — AB
Tetrahydrocannabinol: NOT DETECTED

## 2023-04-30 LAB — I-STAT CG4 LACTIC ACID, ED
Lactic Acid, Venous: 2.9 mmol/L (ref 0.5–1.9)
Lactic Acid, Venous: 1.7 mmol/L (ref 0.5–1.9)

## 2023-04-30 LAB — TSH: TSH: 2.125 u[IU]/mL (ref 0.350–4.500)

## 2023-04-30 LAB — TROPONIN I (HIGH SENSITIVITY)
Troponin I (High Sensitivity): 48 ng/L — ABNORMAL HIGH (ref <18)
Troponin I (High Sensitivity): 36 ng/L — ABNORMAL HIGH (ref <18)

## 2023-04-30 LAB — APTT: aPTT: 45 s — ABNORMAL HIGH (ref 24–36)

## 2023-04-30 MED ORDER — DULOXETINE HCL 60 MG PO CPEP
60.0000 mg | ORAL_CAPSULE | Freq: Every day | ORAL | Status: DC
  Administered 2023-05-01 – 2023-05-21 (×21): 60 mg via ORAL
  Filled 2023-04-30 (×21): qty 1

## 2023-04-30 MED ORDER — PIPERACILLIN-TAZOBACTAM 3.375 G IVPB 30 MIN
3.3750 g | Freq: Once | INTRAVENOUS | Status: AC
  Administered 2023-04-30: 3.375 g via INTRAVENOUS
  Filled 2023-04-30: qty 50

## 2023-04-30 MED ORDER — IPRATROPIUM-ALBUTEROL 0.5-2.5 (3) MG/3ML IN SOLN
3.0000 mL | RESPIRATORY_TRACT | Status: DC | PRN
  Administered 2023-04-30 – 2023-05-01 (×2): 3 mL via RESPIRATORY_TRACT
  Filled 2023-04-30 (×2): qty 3

## 2023-04-30 MED ORDER — FOLIC ACID 1 MG PO TABS
1.0000 mg | ORAL_TABLET | Freq: Every day | ORAL | Status: DC
  Administered 2023-05-01 – 2023-05-21 (×21): 1 mg via ORAL
  Filled 2023-04-30 (×21): qty 1

## 2023-04-30 MED ORDER — PIPERACILLIN-TAZOBACTAM 3.375 G IVPB
3.3750 g | Freq: Three times a day (TID) | INTRAVENOUS | Status: AC
  Administered 2023-04-30 – 2023-05-05 (×15): 3.375 g via INTRAVENOUS
  Filled 2023-04-30 (×15): qty 50

## 2023-04-30 MED ORDER — ACETAMINOPHEN 325 MG PO TABS: 650.0000 mg | ORAL_TABLET | Freq: Four times a day (QID) | ORAL | Status: DC | PRN

## 2023-04-30 MED ORDER — ATORVASTATIN CALCIUM 80 MG PO TABS
80.0000 mg | ORAL_TABLET | Freq: Every day | ORAL | Status: DC
  Administered 2023-05-01 – 2023-05-21 (×21): 80 mg via ORAL
  Filled 2023-04-30 (×21): qty 1

## 2023-04-30 MED ORDER — FLUTICASONE FUROATE-VILANTEROL 200-25 MCG/ACT IN AEPB
1.0000 | INHALATION_SPRAY | Freq: Every day | RESPIRATORY_TRACT | Status: DC
  Administered 2023-05-01 – 2023-05-02 (×2): 1 via RESPIRATORY_TRACT
  Filled 2023-04-30: qty 28

## 2023-04-30 MED ORDER — EZETIMIBE 10 MG PO TABS
10.0000 mg | ORAL_TABLET | Freq: Every day | ORAL | Status: DC
  Administered 2023-05-01 – 2023-05-21 (×21): 10 mg via ORAL
  Filled 2023-04-30 (×21): qty 1

## 2023-04-30 MED ORDER — MORPHINE SULFATE (PF) 4 MG/ML IV SOLN: 4.0000 mg | Freq: Once | INTRAVENOUS | Status: DC

## 2023-04-30 MED ORDER — ASPIRIN 81 MG PO TBEC
81.0000 mg | DELAYED_RELEASE_TABLET | Freq: Every day | ORAL | Status: DC
  Administered 2023-05-02 – 2023-05-16 (×14): 81 mg via ORAL
  Filled 2023-04-30 (×15): qty 1

## 2023-04-30 MED ORDER — LACTATED RINGERS IV SOLN: INTRAVENOUS | Status: AC

## 2023-04-30 MED ORDER — BUDESON-GLYCOPYRROL-FORMOTEROL 160-9-4.8 MCG/ACT IN AERO: 2.0000 | INHALATION_SPRAY | Freq: Two times a day (BID) | RESPIRATORY_TRACT | Status: DC

## 2023-04-30 MED ORDER — IOHEXOL 350 MG/ML SOLN
60.0000 mL | Freq: Once | INTRAVENOUS | Status: AC | PRN
  Administered 2023-04-30: 60 mL via INTRAVENOUS

## 2023-04-30 MED ORDER — ACETAMINOPHEN 325 MG PO TABS
650.0000 mg | ORAL_TABLET | Freq: Once | ORAL | Status: AC
  Administered 2023-04-30: 650 mg via ORAL
  Filled 2023-04-30: qty 2

## 2023-04-30 MED ORDER — FERROUS SULFATE 325 (65 FE) MG PO TABS
325.0000 mg | ORAL_TABLET | Freq: Every day | ORAL | Status: DC
  Administered 2023-05-01 – 2023-05-17 (×17): 325 mg via ORAL
  Filled 2023-04-30 (×17): qty 1

## 2023-04-30 MED ORDER — METOPROLOL SUCCINATE ER 50 MG PO TB24
50.0000 mg | ORAL_TABLET | Freq: Every day | ORAL | Status: DC
  Administered 2023-05-01 – 2023-05-18 (×18): 50 mg via ORAL
  Filled 2023-04-30 (×3): qty 1
  Filled 2023-04-30 (×2): qty 2
  Filled 2023-04-30 (×7): qty 1
  Filled 2023-04-30: qty 2
  Filled 2023-04-30: qty 1
  Filled 2023-04-30: qty 2
  Filled 2023-04-30: qty 1
  Filled 2023-04-30: qty 2
  Filled 2023-04-30 (×2): qty 1
  Filled 2023-04-30: qty 2

## 2023-04-30 MED ORDER — ISOSORBIDE MONONITRATE ER 30 MG PO TB24
30.0000 mg | ORAL_TABLET | Freq: Every day | ORAL | Status: DC
  Administered 2023-05-01 – 2023-05-21 (×21): 30 mg via ORAL
  Filled 2023-04-30 (×21): qty 1

## 2023-04-30 MED ORDER — LACTATED RINGERS IV BOLUS (SEPSIS): 500.0000 mL | Freq: Once | INTRAVENOUS | Status: DC

## 2023-04-30 MED ORDER — LACTATED RINGERS IV BOLUS (SEPSIS): 1000.0000 mL | Freq: Once | INTRAVENOUS | Status: DC

## 2023-04-30 MED ORDER — LACTATED RINGERS IV BOLUS (SEPSIS)
1000.0000 mL | Freq: Once | INTRAVENOUS | Status: AC
  Administered 2023-04-30: 1000 mL via INTRAVENOUS

## 2023-04-30 MED ORDER — GABAPENTIN 300 MG PO CAPS
300.0000 mg | ORAL_CAPSULE | Freq: Two times a day (BID) | ORAL | Status: DC
  Administered 2023-04-30 – 2023-05-21 (×42): 300 mg via ORAL
  Filled 2023-04-30 (×42): qty 1

## 2023-04-30 MED ORDER — PANTOPRAZOLE SODIUM 40 MG PO TBEC
40.0000 mg | DELAYED_RELEASE_TABLET | Freq: Every day | ORAL | Status: DC
  Administered 2023-05-01 – 2023-05-18 (×18): 40 mg via ORAL
  Filled 2023-04-30 (×18): qty 1

## 2023-04-30 MED ORDER — HYDRALAZINE HCL 50 MG PO TABS
50.0000 mg | ORAL_TABLET | Freq: Three times a day (TID) | ORAL | Status: DC
  Administered 2023-04-30 – 2023-05-21 (×52): 50 mg via ORAL
  Filled 2023-04-30 (×57): qty 1

## 2023-04-30 MED ORDER — MORPHINE SULFATE (PF) 2 MG/ML IV SOLN
2.0000 mg | INTRAVENOUS | Status: DC | PRN
  Administered 2023-05-01 (×3): 2 mg via INTRAVENOUS
  Filled 2023-04-30 (×3): qty 1

## 2023-04-30 MED ORDER — MORPHINE SULFATE (PF) 4 MG/ML IV SOLN
4.0000 mg | Freq: Once | INTRAVENOUS | Status: AC
  Administered 2023-04-30: 4 mg via INTRAVENOUS
  Filled 2023-04-30: qty 1

## 2023-04-30 MED ORDER — UMECLIDINIUM BROMIDE 62.5 MCG/ACT IN AEPB
1.0000 | INHALATION_SPRAY | Freq: Every day | RESPIRATORY_TRACT | Status: DC
  Administered 2023-05-01 – 2023-05-02 (×2): 1 via RESPIRATORY_TRACT
  Filled 2023-04-30: qty 7

## 2023-04-30 MED ORDER — NICOTINE 21 MG/24HR TD PT24
21.0000 mg | MEDICATED_PATCH | Freq: Every day | TRANSDERMAL | Status: DC
  Administered 2023-04-30 – 2023-05-21 (×22): 21 mg via TRANSDERMAL
  Filled 2023-04-30 (×22): qty 1

## 2023-04-30 NOTE — ED Provider Notes (Signed)
.  Critical Care  Performed by: Franne Forts, DO Authorized by: Franne Forts, DO   Critical care provider statement:    Critical care time (minutes):  30   Critical care was necessary to treat or prevent imminent or life-threatening deterioration of the following conditions: acute appendicitis with perf.   Critical care was time spent personally by me on the following activities:  Development of treatment plan with patient or surrogate, discussions with consultants, evaluation of patient's response to treatment, examination of patient, ordering and review of laboratory studies, ordering and review of radiographic studies, ordering and performing treatments and interventions, pulse oximetry, re-evaluation of patient's condition and review of old charts   Care discussed with: admitting provider     Care discussed with comment:  General surgery     Franne Forts, DO 04/30/23 1846

## 2023-04-30 NOTE — ED Provider Notes (Signed)
[place] [facility]ERGENCY DEPARTMENT AT [facility] Provider Note   CSN: [id number] Arrival date & time: 04/30/23  1005     History  Chief Complaint  Patient presents with   Shortness of Breath   Headache   Constipation   Testicle Pain   Nasal Congestion    Jared [name] is a 67 y.o. male history of COPD on 2 L nasal cannula, cigarette use, CAD, diabetes, GI bleed, A-fib, PVD, Xarelto presented for multiple concerns including shortness of breath, headache, right lower quadrant pain, testicle pain.  Patient states all the symptoms occurred 5 days ago.  Patient states that his testicles hurt when he presses on them but denies any swelling or skin color changes or dysuria or hematuria.  Patient states has had decreased appetite as well.  Patient is unsure of fevers but is felt warm.  Patient dates there is a slight chest discomfort but otherwise no changes in oxygen requirement.  Patient has headache is frontal and does not radiate and denies any vision changes, neck pain, weakness, paresthesias, nausea/vomiting, AMS.    Home Medications Prior to Admission medications   Medication Sig Start Date End Date Taking? Authorizing Provider  Accu-Chek Softclix Lancets lancets Use to check blood sugar once daily. 01/09/23   [name], MD  acetaminophen (TYLENOL) 500 MG tablet Take 2 tablets (1,000 mg total) by mouth every 8 (eight) hours as needed for moderate pain. 01/03/23   [name], MD  albuterol (VENTOLIN HFA) 108 (90 Base) MCG/ACT inhaler INHALE 2 PUFFS INTO THE LUNGS EVERY 6 (SIX) HOURS AS NEEDED FOR WHEEZING OR SHORTNESS OF BREATH. Patient taking differently: Inhale 2 puffs into the lungs daily as needed for wheezing or shortness of breath. 12/06/22   [name], MD  aspirin EC 81 MG tablet Take 1 tablet (81 mg total) by mouth daily. Swallow whole. 12/07/22   Leonie Douglas, MD  atorvastatin (LIPITOR) 80 MG tablet Take 1 tablet (80 mg total) by mouth daily. 02/04/23    [name], MD  Blood Glucose Monitoring Suppl (ACCU-CHEK GUIDE) w/Device KIT Use to check blood sugar once daily. 01/30/23   [name], MD  Budeson-Glycopyrrol-Formoterol (BREZTRI AEROSPHERE) 160-9-4.8 MCG/ACT AERO Take 2 puffs first thing in morning and then another 2 puffs about 12 hours later. 02/25/23   Nyoka Cowden, MD  DULoxetine (CYMBALTA) 60 MG capsule Take 1 capsule (60 mg total) by mouth daily. For chronic leg pains 01/09/23   [name], MD  ergocalciferol (DRISDOL) 1.25 MG (50000 UT) capsule Take 1 capsule (50,000 Units total) by mouth once a week. 02/26/23   [name], MD  ezetimibe (ZETIA) 10 MG tablet Take 1 tablet (10 mg total) by mouth daily. 01/31/23   Pricilla Riffle, MD  ferrous sulfate (FEROSUL) 325 (65 FE) MG tablet Take 1 tablet (325 mg total) by mouth daily with breakfast. 01/30/23   Sherrilyn Rist, MD  folic acid (FOLVITE) 1 MG tablet Take 1 tablet (1 mg total) by mouth daily. 01/30/23   [name], MD  furosemide (LASIX) 40 MG tablet Take 1-2 tablets (40-80 mg total) by mouth 2 (two) times daily as needed.Take an extra pill with worsening pedal edema. 01/09/23   [name], MD  gabapentin (NEURONTIN) 300 MG capsule Take 1 capsule (300 mg total) by mouth 2 (two) times daily. 02/25/23   Kathryne Hitch, MD  glucose blood (ACCU-CHEK GUIDE) test strip Use to check blood sugar once daily. 01/09/23   [name], MD  hydrALAZINE (APRESOLINE) 50 MG tablet Take 1 tablet (50 mg total) by mouth every 8 (eight) hours. 02/04/23   [name], MD  isosorbide mononitrate (IMDUR) 30 MG 24 hr tablet Take 1 tablet (30 mg total) by mouth daily. 01/30/23   [name], MD  metoprolol succinate (TOPROL-XL) 50 MG 24 hr tablet Take 1 tablet (50 mg total) by mouth daily. Take with or immediately following a meal. 02/04/23   [name], MD  nitroGLYCERIN (NITROSTAT) 0.4 MG SL tablet Place 1 tablet (0.4 mg total) under the tongue every 5 (five) minutes  as needed for chest pain. Patient not taking: Reported on 12/04/2022 11/19/20 03/21/22  Albertine Grates, MD  OXYGEN Inhale 2 L/min into the lungs continuous.    [provider]  pantoprazole (PROTONIX) 40 MG tablet Take 1 tablet (40 mg total) by mouth daily. 02/04/23   [name], MD  rivaroxaban (XARELTO) 20 MG TABS tablet Take 1 tablet (20 mg total) by mouth daily with supper. 10/25/22   [name], MD  TUDORZA PRESSAIR 400 MCG/ACT AEPB INHALE 1 PUFF INTO THE LUNGS IN THE MORNING AND AT BEDTIME. 03/29/20 04/04/20  Nyoka Cowden, MD      Allergies    Lisinopril    Review of Systems   Review of Systems  Respiratory:  Positive for shortness of breath.   Gastrointestinal:  Positive for constipation.  Genitourinary:  Positive for testicular pain.  Neurological:  Positive for headaches.    Physical Exam Updated Vital Signs BP 119/75   Pulse (!) 106   Temp 98.6 F (37 C) (Oral)   Resp 19   SpO2 100%  Physical Exam Vitals reviewed.  Constitutional:      General: He is in acute distress.     Appearance: He is ill-appearing.  HENT:     Head: Normocephalic and atraumatic.  Eyes:     Extraocular Movements: Extraocular movements intact.     Conjunctiva/sclera: Conjunctivae normal.     Pupils: Pupils are equal, round, and reactive to light.  Cardiovascular:     Rate and Rhythm: Tachycardia present. Rhythm irregular.     Heart sounds: Normal heart sounds.     Comments: 2+ bilateral radial/dorsalis pedis pulses with regular rate Pulmonary:     Effort: Pulmonary effort is normal. No respiratory distress.     Breath sounds: Normal breath sounds.     Comments: On 2 L, baseline Abdominal:     Palpations: Abdomen is soft.     Tenderness: There is abdominal tenderness. There is guarding (Right lower quadrant). There is no rebound.  Genitourinary:    Comments: Chaperone: Corum, Kaitlynn B, RN Bilateral testicular tenderness generally No edema or skin color changes noted No  inguinal hernia palpated No skin lesions noted Musculoskeletal:        General: Normal range of motion.     Cervical back: Normal range of motion and neck supple.     Comments: 5 out of 5 bilateral grip/leg extension strength  Skin:    General: Skin is warm and dry.     Capillary Refill: Capillary refill takes less than 2 seconds.  Neurological:     General: No focal deficit present.     Mental Status: He is alert and oriented to person, place, and time.     Comments: Sensation intact in all 4 limbs  Psychiatric:        Mood and Affect: Mood normal.     ED Results / Procedures / Treatments  Labs (all labs ordered are listed, but only abnormal results are displayed) Labs Reviewed  BASIC METABOLIC PANEL - Abnormal; Notable for the following components:      Result Value   Glucose, Bld 129 (*)    Creatinine, Ser 1.65 (*)    Calcium 8.5 (*)    GFR, Estimated 45 (*)    All other components within normal limits  CBC - Abnormal; Notable for the following components:   WBC 13.4 (*)    RBC 3.81 (*)    Hemoglobin 10.1 (*)    HCT 34.6 (*)    MCHC 29.2 (*)    RDW 15.6 (*)    All other components within normal limits  URINALYSIS, ROUTINE W REFLEX MICROSCOPIC - Abnormal; Notable for the following components:   APPearance HAZY (*)    All other components within normal limits  PROTIME-INR - Abnormal; Notable for the following components:   Prothrombin Time 20.4 (*)    INR 1.7 (*)    All other components within normal limits  APTT - Abnormal; Notable for the following components:   aPTT 45 (*)    All other components within normal limits  HEPATIC FUNCTION PANEL - Abnormal; Notable for the following components:   Total Bilirubin 1.3 (*)    Indirect Bilirubin 1.1 (*)    All other components within normal limits  I-STAT CG4 LACTIC ACID, ED - Abnormal; Notable for the following components:   Lactic Acid, Venous 2.9 (*)    All other components within normal limits  TROPONIN I (HIGH  SENSITIVITY) - Abnormal; Notable for the following components:   Troponin I (High Sensitivity) 48 (*)    All other components within normal limits  TROPONIN I (HIGH SENSITIVITY) - Abnormal; Notable for the following components:   Troponin I (High Sensitivity) 36 (*)    All other components within normal limits  RESP PANEL BY RT-PCR (RSV, FLU A&B, COVID)  RVPGX2  CULTURE, BLOOD (ROUTINE X 2)  CULTURE, BLOOD (ROUTINE X 2)  URINALYSIS, W/ REFLEX TO CULTURE (INFECTION SUSPECTED)  I-STAT CG4 LACTIC ACID, ED    EKG EKG Interpretation Date/Time:  Tuesday April 30 2023 10:12:42 EST Ventricular Rate:  132 PR Interval:  168 QRS Duration:  90 QT Interval:  310 QTC Calculation: 459 R Axis:   -84  Text Interpretation: Sinus tachycardia Left axis deviation Pulmonary disease pattern Inferior infarct , age undetermined Abnormal ECG When compared with ECG of 26-Dec-2022 09:41, PREVIOUS ECG IS PRESENT Confirmed by Anders Simmonds [phone]) on 04/30/2023 11:16:36 AM  Radiology CT Head Wo Contrast  Result Date: 04/30/2023 CLINICAL DATA:  frontal HA, sepsis EXAM: CT HEAD WITHOUT CONTRAST TECHNIQUE: Contiguous axial images were obtained from the base of the skull through the vertex without intravenous contrast. RADIATION DOSE REDUCTION: This exam was performed according to the departmental dose-optimization program which includes automated exposure control, adjustment of the mA and/or kV according to patient size and/or use of iterative reconstruction technique. COMPARISON:  None Available. FINDINGS: Brain: No hemorrhage. No hydrocephalus. No extra-axial fluid collection. No CT evidence of an acute cortical infarct. No mass effect. No mass lesion. Mineralization of the basal ganglia bilaterally. Vascular: No hyperdense vessel or unexpected calcification. Skull: Normal. Negative for fracture or focal lesion. Sinuses/Orbits: No middle ear effusion. There is a trace left mastoid effusion. Paranasal sinuses are  clear. Bilateral lens replacement. Orbits are otherwise unremarkable. Other: None. IMPRESSION: 1. No CT finding to explain frontal headache. 2. Trace left mastoid effusion. Electronically Signed  By: Lorenza Cambridge M.D.   On: 04/30/2023 14:44    Procedures .Critical Care  Performed by: Netta Corrigan, PA-C Authorized by: Netta Corrigan, PA-C   Critical care provider statement:    Critical care time (minutes):  40   Critical care time was exclusive of:  Separately billable procedures and treating other patients   Critical care was necessary to treat or prevent imminent or life-threatening deterioration of the following conditions:  Sepsis   Critical care was time spent personally by me on the following activities:  Blood draw for specimens, development of treatment plan with patient or surrogate, evaluation of patient's response to treatment, examination of patient, obtaining history from patient or surrogate, review of old charts, re-evaluation of patient's condition, pulse oximetry, ordering and review of radiographic studies, ordering and review of laboratory studies and ordering and performing treatments and interventions   I assumed direction of critical care for this patient from another provider in my specialty: no       Medications Ordered in ED Medications  lactated ringers infusion ( Intravenous New Bag/Given 04/30/23 1244)  lactated ringers bolus 1,000 mL (1,000 mLs Intravenous New Bag/Given 04/30/23 1243)  piperacillin-tazobactam (ZOSYN) IVPB 3.375 g (0 g Intravenous Stopped 04/30/23 1242)  acetaminophen (TYLENOL) tablet 650 mg (650 mg Oral Given 04/30/23 1155)  iohexol (OMNIPAQUE) 350 MG/ML injection 60 mL (60 mLs Intravenous Contrast Given 04/30/23 1258)    ED Course/ Medical Decision Making/ A&P                                 Medical Decision Making Amount and/or Complexity of Data Reviewed Labs: ordered. Radiology: ordered.  Risk OTC drugs. Prescription drug  management.   [facility] 67 y.o. presented today for sepsis.  Working DDx that I considered at this time includes, but not limited to, sepsis, bacteremia, UTI, pneumonia, meningitis/encephalitis, cellulitis, ACS, myocarditis, acidosis, dehydration, electrolyte abnormalities, appendicitis, COPD exacerbation.  R/o DDx: pending  Review of prior external notes: 03/21/2023 office visit  Unique Tests and My Interpretation: CBC: Leukocytosis 13.4 CMP: AKI creatinine 1.65 Lactic acid: 2.9 UA: Unremarkable Chest x-ray: No acute changes aPTT: 45, 36 PT/INR: 20.4/1.7 Blood cultures: Pending Respiratory panel: Negative EKG: Sinus tachycardia 132, no ST elevations or depressions noted CT head: Trace mastoid effusion CT abd pelvis contrast: pending Ultrasound scrotum: pending  Social Determinants of Health: none  Discussion with Independent Historian: None  Discussion of Management of Tests: None  Risk: High: Hospitalization  Risk Stratification Score: None  Staffed with Andria Meuse, DO  Plan: On exam patient was septic on arrival.  On exam patient ill-appearing in acute distress.  Patient did appear septic in which she was tachycardic and febrile and slightly tachypneic.  Patient is still smoking cigarettes which may be contributing to his shortness of breath however given his septic appearance code sepsis was initiated.  Patient given Tylenol.  EKG was sinus tach however when I palpated patient's pulses they were irregular indicative of possible A-fib.  Either way patient was tachycardic most likely due to sepsis.  GU exam was conducted with a chaperone that shows tenderness bilaterally to his testicles however no other abnormalities noted but will get ultrasound.  Patient did have right lower quadrant tenderness as well with guarding and so we will get CT to rule out intra-abdominal pathology versus hernia.  Patient was saying that he had a headache in his frontal sinuses that I was  unable  to reproduce but was not endorsing any other red flag symptoms.  Will get CT scan to rule out other pathologies.  Patient started on Zosyn and Tylenol and is stable at this time.  Patient syncopized getting the two-view chest x-ray and so this will be changed to a portable.  I spoke to the patient states he has been having syncopal episodes recently in which he just "drops" without any prodromal symptoms which is concerning.  Patient denies any prodromal symptoms.  Labs do show AKI.  In conjunction with AKI, syncope without cause, septic presentation do anticipate admission however waiting for further data at this time.  Patient signed out to [facility], PA-C.  Please review their note for the continuation of patient's care.  The plan at this point is follow-up in imaging and plan on admission due to sepsis, AKI, syncope without cause.  This chart was dictated using voice recognition software.  Despite best efforts to proofread,  errors can occur which can change the documentation meaning.         Final Clinical Impression(s) / ED Diagnoses Final diagnoses:  Sepsis with acute renal failure without septic shock, due to unspecified organism, unspecified acute renal failure type (HCC)  Syncope, unspecified syncope type    Rx / DC Orders ED Discharge Orders     None         Netta Corrigan, PA-C 04/30/23 1622    Anders Simmonds T, DO 05/01/23 [phone]

## 2023-04-30 NOTE — Sepsis Progress Note (Signed)
[facility] will follow per sepsis protocol

## 2023-04-30 NOTE — ED Triage Notes (Addendum)
Pt. Stated, I started having a headache with SOB, not had a bowel movement since last Thursday, Ive not had a good appetite. My testicles are hurting and if I touch them they are really tender.This started about a week ago. I feel like most of my pain is on the upper right side and goes through my tedticle

## 2023-04-30 NOTE — ED Notes (Signed)
RN reassessed pt, pt states he feels "okay" right now and SOB is not getting worse at this time

## 2023-04-30 NOTE — Assessment & Plan Note (Signed)
Continue aspirin 

## 2023-04-30 NOTE — Assessment & Plan Note (Signed)
-   Continue hydralazine, Imdur, metoprolol.  Holding Lasix due to AKI

## 2023-04-30 NOTE — ED Notes (Signed)
ED TO INPATIENT HANDOFF REPORT  ED Nurse Name and Phone #: Francene Castle [phone]  S Name/Age/Gender Jared [name] 67 y.o. male Room/Bed: 019C/019C  Code Status   Code Status: Full Code  Home/SNF/Other Home Patient oriented to: self, place, time, and situation Is this baseline? Yes   Triage Complete: Triage complete  Chief Complaint Acute perforated appendicitis [K35.32]  Triage Note Pt. Stated, I started having a headache with SOB, not had a bowel movement since last Thursday, Ive not had a good appetite. My testicles are hurting and if I touch them they are really tender.This started about a week ago. I feel like most of my pain is on the upper right side and goes through my tedticle   Allergies Allergies  Allergen Reactions   Lisinopril Cough    Level of Care/Admitting Diagnosis ED Disposition     ED Disposition  Admit   Condition  --   Comment  Hospital Area: [facility] [[id number]]  Level of Care: Telemetry Medical [[id number]]  May admit patient to [facility] or [facility] if equivalent level of care is available:: No  Covid Evaluation: Asymptomatic - no recent exposure (last 10 days) testing not required  Diagnosis: Acute perforated appendicitis [818299]  Admitting Physician: Anselm Jungling [[id number]]  Attending Physician: Anselm Jungling [[id number]]  Certification:: I certify this patient will need inpatient services for at least 2 midnights  Expected Medical Readiness: 05/03/2023          B Medical/Surgery History Past Medical History:  Diagnosis Date   AVM (arteriovenous malformation)    CAD (coronary artery disease)    a. LHC 5/12:  LAD 20, pLCx 20, pRCA 40, dRCA 40, EF 35%, diff HK  //  b. Myoview 4/16: Overall Impression:  High risk stress nuclear study There is no evidence of ischemia.  There is severe LV dysfunction. LV Ejection Fraction: 30%.  LV Wall Motion:  There is global LV hypokinesis.     CAP (community acquired pneumonia) 09/2013    Chronic combined systolic and diastolic CHF (congestive heart failure) (HCC)    a. Echo 4/16:Mild LVH, EF 40-45%, diffuse HK //  b. Echo 8/17: EF 35-40%, diffuse HK, diastolic dysfunction, aortic sclerosis, trivial MR, moderate LAE, normal RVSF, moderate RAE, mild TR, PASP 42 mmHg // c. Echo 4/18: Mild concentric LVH, EF 30-35, normal wall motion, grade 1 diastolic dysfunction, PASP 49   Chronic respiratory failure (HCC)    Cluster headache    "hx; haven't had one in awhile" (01/09/2016)   COPD (chronic obstructive pulmonary disease) (HCC)    [name] 01/09/2016   DM (diabetes mellitus) (HCC)    History of CVA (cerebrovascular accident)    Hypertension    IDA (iron deficiency anemia)    Moderate tobacco use disorder    NICM (nonischemic cardiomyopathy) (HCC)    Nicotine addiction    Tobacco abuse    Type 2 diabetes mellitus (HCC) 05/14/2016   Past Surgical History:  Procedure Laterality Date   ABDOMINAL AORTOGRAM W/LOWER EXTREMITY N/A 12/07/2022   Procedure: ABDOMINAL AORTOGRAM W/LOWER EXTREMITY;  Surgeon: Leonie Douglas, MD;  [facility];  Service: Cardiovascular;  Laterality: N/A;   BIOPSY  11/12/2020   Procedure: BIOPSY;  Surgeon: Lemar Lofty., MD;  Location: [facility] ENDOSCOPY;  Service: Gastroenterology;;   CARDIAC CATHETERIZATION  10/2010   LM normal, LAD with 20% irregularities, LCX with 20%, RCA with 40% prox and 40% distal - EF of 35%   CATARACT  EXTRACTION, BILATERAL     COLONOSCOPY W/ BIOPSIES AND POLYPECTOMY     COLONOSCOPY WITH PROPOFOL N/A 09/06/2018   Procedure: COLONOSCOPY WITH PROPOFOL;  Surgeon: Tressia Danas, MD;  Location: [facility] ENDOSCOPY;  Service: Gastroenterology;  Laterality: N/A;   ENTEROSCOPY N/A 09/28/2018   Procedure: ENTEROSCOPY;  Surgeon: Jeani Hawking, MD;  Location: [facility] ENDOSCOPY;  Service: Endoscopy;  Laterality: N/A;   ENTEROSCOPY N/A 10/28/2018   Procedure: ENTEROSCOPY;  Surgeon: Tressia Danas, MD;  Location: [facility] ENDOSCOPY;   Service: Gastroenterology;  Laterality: N/A;   ENTEROSCOPY N/A 10/09/2020   Procedure: ENTEROSCOPY;  Surgeon: Hilarie Fredrickson, MD;  Location: [facility] ENDOSCOPY;  Service: Endoscopy;  Laterality: N/A;   ENTEROSCOPY N/A 03/22/2022   Procedure: ENTEROSCOPY;  Surgeon: Benancio Deeds, MD;  Location: [facility] ENDOSCOPY;  Service: Gastroenterology;  Laterality: N/A;   ESOPHAGOGASTRODUODENOSCOPY N/A 11/12/2020   Procedure: ESOPHAGOGASTRODUODENOSCOPY (EGD);  Surgeon: Lemar Lofty., MD;  Location: [facility] ENDOS[facility];  Service: Gastroenterology;  Laterality: N/A;   ESOPHAGOGASTRODUODENOSCOPY (EGD) WITH PROPOFOL N/A 09/05/2018   Procedure: ESOPHAGOGASTRODUODENOSCOPY (EGD) WITH PROPOFOL;  Surgeon: Benancio Deeds, MD;  Location: [facility] ENDOSCOPY;  Service: Gastroenterology;  Laterality: N/A;   ESOPHAGOGASTRODUODENOSCOPY (EGD) WITH PROPOFOL N/A 11/19/2020   Procedure: ESOPHAGOGASTRODUODENOSCOPY (EGD) WITH PROPOFOL;  Surgeon: Beverley Fiedler, MD;  Location: [facility] ENDOSCOPY;  Service: Gastroenterology;  Laterality: N/A;   ESOPHAGOGASTRODUODENOSCOPY (EGD) WITH PROPOFOL N/A 12/27/2022   Procedure: ESOPHAGOGASTRODUODENOSCOPY (EGD) WITH PROPOFOL;  Surgeon: Napoleon Form, MD;  Location: [facility] ENDOSCOPY;  Service: Gastroenterology;  Laterality: N/A;   EXCISION MASS HEAD     GIVENS CAPSULE STUDY N/A 09/06/2018   Procedure: GIVENS CAPSULE STUDY;  Surgeon: Tressia Danas, MD;  Location: [facility] ENDOSCOPY;  Service: Gastroenterology;  Laterality: N/A;   GIVENS CAPSULE STUDY N/A 09/26/2018   Procedure: GIVENS CAPSULE STUDY;  Surgeon: Beverley Fiedler, MD;  Location: [facility] ENDOSCOPY;  Service: Gastroenterology;  Laterality: N/A;   GIVENS CAPSULE STUDY N/A 06/14/2019   Procedure: GIVENS CAPSULE STUDY;  Surgeon: Napoleon Form, MD;  Location[facility] ENDOSCOPY;  Service: Endoscopy;  Laterality: N/A;   HEMOSTASIS CLIP PLACEMENT  11/12/2020   Procedure: HEMOSTASIS CLIP PLACEMENT;  Surgeon: Lemar Lofty., MD;  Location: [facility] ENDOSCOPY;   Service: Gastroenterology;;   HEMOSTASIS CONTROL  11/12/2020   Procedure: HEMOSTASIS CONTROL;  Surgeon: Lemar Lofty., MD;  Location: [facility] ENDOSCOPY;  Service: Gastroenterology;;   HOT HEMOSTASIS N/A 10/28/2018   Procedure: HOT HEMOSTASIS (ARGON PLASMA COAGULATION/BICAP);  Surgeon: Tressia Danas, MD;  Location: [facility] ENDOSCOPY;  Service: Gastroenterology;  Laterality: N/A;   HOT HEMOSTASIS N/A 11/12/2020   Procedure: HOT HEMOSTASIS (ARGON PLASMA COAGULATION/BICAP);  Surgeon: Lemar Lofty., MD;  Location: [facility] ENDOSCOPY;  Service: Gastroenterology;  Laterality: N/A;   HOT HEMOSTASIS N/A 03/22/2022   Procedure: HOT HEMOSTASIS (ARGON PLASMA COAGULATION/BICAP);  Surgeon: Benancio Deeds, MD;  Location: [facility] ENDOSCOPY;  Service: Gastroenterology;  Laterality: N/A;   INCISION AND DRAINAGE PERIRECTAL ABSCESS N/A 06/05/2017   Procedure: IRRIGATION AND DEBRIDEMENT PERIRECTAL ABSCESS;  Surgeon: Andria Meuse, MD;  Location: [facility] OR;  Service: General;  Laterality: N/A;   PERIPHERAL VASCULAR INTERVENTION  12/07/2022   Procedure: PERIPHERAL VASCULAR INTERVENTION;  Surgeon: Leonie Douglas, MD;  [facility] CV LAB;  Service: Cardiovascular;;   SUBMUCOSAL TATTOO INJECTION  11/12/2020   Procedure: SUBMUCOSAL TATTOO INJECTION;  Surgeon: Lemar Lofty., MD;  Location: [facility] ENDOSCOPY;  Service: Gastroenterology;;   VIDEO BRONCHOSCOPY Bilateral 05/08/2016   Procedure: VIDEO BRONCHOSCOPY WITH FLUORO;  Surgeon: Oretha Milch, MD;  Location:  [facility] ENDOSCOPY;  Service: Cardiopulmonary;  Laterality: Bilateral;     A IV Location/Drains/Wounds Patient Lines/Drains/Airways Status     Active Line/Drains/Airways     Name Placement date Placement time Site Days   Peripheral IV 04/30/23 20 G 1" Left Antecubital 04/30/23  1144  Antecubital  less than 1            Intake/Output Last 24 hours No intake or output data in the 24 hours ending 04/30/23 1920  Labs/Imaging Results for  orders placed or performed during the hospital encounter of 04/30/23 (from the past 48 hour(s))  Basic metabolic panel     Status: Abnormal   Collection Time: 04/30/23 10:27 AM  Result Value Ref Range   Sodium 135 135 - 145 mmol/L   Potassium 3.6 3.5 - 5.1 mmol/L   Chloride 98 98 - 111 mmol/L   CO2 27 22 - 32 mmol/L   Glucose, Bld 129 (H) 70 - 99 mg/dL    Comment: Glucose reference range applies only to samples taken after fasting for at least 8 hours.   BUN 12 8 - 23 mg/dL   Creatinine, Ser 8.41 (H) 0.61 - 1.24 mg/dL   Calcium 8.5 (L) 8.9 - 10.3 mg/dL   GFR, Estimated 45 (L) >60 mL/min    Comment: (NOTE) Calculated using the CKD-EPI Creatinine Equation (2021)    Anion gap 10 5 - 15    Comment: Performed at [facility] Lab, [address], [place], [place] [address]  CBC     Status: Abnormal   Collection Time: 04/30/23 10:27 AM  Result Value Ref Range   WBC 13.4 (H) 4.0 - 10.5 K/uL   RBC 3.81 (L) 4.22 - 5.81 MIL/uL   Hemoglobin 10.1 (L) 13.0 - 17.0 g/dL   HCT 10.2 (L) 72.5 - 36.6 %   MCV 90.8 80.0 - 100.0 fL   MCH 26.5 26.0 - 34.0 pg   MCHC 29.2 (L) 30.0 - 36.0 g/dL   RDW 44.0 (H) 34.7 - 42.5 %   Platelets 306 150 - 400 K/uL   nRBC 0.0 0.0 - 0.2 %    Comment: Performed at [facility], [address] [place], [place] [address]  Urinalysis, Routine w reflex microscopic -Urine, Random     Status: Abnormal   Collection Time: 04/30/23 10:27 AM  Result Value Ref Range   Color, Urine YELLOW YELLOW   APPearance HAZY (A) CLEAR   Specific Gravity, Urine 1.013 1.005 - 1.030   pH 5.0 5.0 - 8.0   Glucose, UA NEGATIVE NEGATIVE mg/dL   Hgb urine dipstick NEGATIVE NEGATIVE   Bilirubin Urine NEGATIVE NEGATIVE   Ketones, ur NEGATIVE NEGATIVE mg/dL   Protein, ur NEGATIVE NEGATIVE mg/dL   Nitrite NEGATIVE NEGATIVE   Leukocytes,Ua NEGATIVE NEGATIVE    Comment: Performed at [facility], [address] [place], [place] [address]  Resp panel by RT-PCR (RSV, Flu A&B, Covid)  Anterior Nasal Swab     Status: None   Collection Time: 04/30/23 11:17 AM   Specimen: Anterior Nasal Swab  Result Value Ref Range   SARS Coronavirus 2 by RT PCR NEGATIVE NEGATIVE   Influenza A by PCR NEGATIVE NEGATIVE   Influenza B by PCR NEGATIVE NEGATIVE    Comment: (NOTE) The Xpert Xpress SARS-CoV-2/FLU/RSV plus assay is intended as an aid in the diagnosis of influenza from Nasopharyngeal swab specimens and should not be used as a sole basis for treatment. Nasal washings and aspirates are unacceptable  for Xpert Xpress SARS-CoV-2/FLU/RSV testing.  Fact Sheet for Patients: BloggerCourse.com  Fact Sheet for Healthcare Providers: SeriousBroker.it  This test is not yet approved or cleared by the [place] FDA and has been authorized for detection and/or diagnosis of SARS-CoV-2 by FDA under an Emergency Use Authorization (EUA). This EUA will remain in effect (meaning this test can be used) for the duration of the COVID-19 declaration under Section 564(b)(1) of the Act, 21 U.S.C. section 360bbb-3(b)(1), unless the authorization is terminated or revoked.     Resp Syncytial Virus by PCR NEGATIVE NEGATIVE    Comment: (NOTE) Fact Sheet for Patients: BloggerCourse.com  Fact Sheet for Healthcare Providers: SeriousBroker.it  This test is not yet approved or cleared by the [place] FDA and has been authorized for detection and/or diagnosis of SARS-CoV-2 by FDA under an Emergency Use Authorization (EUA). This EUA will remain in effect (meaning this test can be used) for the duration of the COVID-19 declaration under Section 564(b)(1) of the Act, 21 U.S.C. section 360bbb-3(b)(1), unless the authorization is terminated or revoked.  Performed at [facility], [address] [place], [place] [address]   Protime-INR     Status: Abnormal   Collection Time: 04/30/23 11:44 AM   Result Value Ref Range   Prothrombin Time 20.4 (H) 11.4 - 15.2 seconds   INR 1.7 (H) 0.8 - 1.2    Comment: (NOTE) INR goal varies based on device and disease states. Performed at [facility], [address] [place], [place] [address]   APTT     Status: Abnormal   Collection Time: 04/30/23 11:44 AM  Result Value Ref Range   aPTT 45 (H) 24 - 36 seconds    Comment:        IF BASELINE aPTT IS ELEVATED, SUGGEST PATIENT RISK ASSESSMENT BE USED TO DETERMINE APPROPRIATE ANTICOAGULANT THERAPY. Performed at [facility], [address] [place], [place] [address]   Troponin I (High Sensitivity)     Status: Abnormal   Collection Time: 04/30/23 11:44 AM  Result Value Ref Range   Troponin I (High Sensitivity) 48 (H) <18 ng/L    Comment: (NOTE) Elevated high sensitivity troponin I (hsTnI) values and significant  changes across serial measurements may suggest ACS but many other  chronic and acute conditions are known to elevate hsTnI results.  Refer to the "Links" section for chest pain algorithms and additional  guidance. Performed at [facility], [address] [place], [place] [address]   Hepatic function panel     Status: Abnormal   Collection Time: 04/30/23 11:44 AM  Result Value Ref Range   Total Protein 7.3 6.5 - 8.1 g/dL   Albumin 3.7 3.5 - 5.0 g/dL   AST 32 15 - 41 U/L    Comment: HEMOLYSIS AT THIS LEVEL MAY AFFECT RESULT   ALT 18 0 - 44 U/L    Comment: HEMOLYSIS AT THIS LEVEL MAY AFFECT RESULT   Alkaline Phosphatase 72 38 - 126 U/L   Total Bilirubin 1.3 (H) <1.2 mg/dL    Comment: HEMOLYSIS AT THIS LEVEL MAY AFFECT RESULT   Bilirubin, Direct 0.2 0.0 - 0.2 mg/dL   Indirect Bilirubin 1.1 (H) 0.3 - 0.9 mg/dL    Comment: Performed at [facility] Lab, [address], [place], [place] [address]  I-Stat Lactic Acid, ED     Status: Abnormal   Collection Time: 04/30/23 12:51 PM  Result Value Ref Range   Lactic Acid, Venous  2.9 (HH) 0.5 - 1.9 mmol/L    Comment NOTIFIED PHYSICIAN   Troponin I (High Sensitivity)     Status: Abnormal   Collection Time: 04/30/23  1:17 PM  Result Value Ref Range   Troponin I (High Sensitivity) 36 (H) <18 ng/L    Comment: (NOTE) Elevated high sensitivity troponin I (hsTnI) values and significant  changes across serial measurements may suggest ACS but many other  chronic and acute conditions are known to elevate hsTnI results.  Refer to the "Links" section for chest pain algorithms and additional  guidance. Performed at [facility], [address] [place], [place] [address]   I-Stat Lactic Acid, ED     Status: None   Collection Time: 04/30/23  1:33 PM  Result Value Ref Range   Lactic Acid, Venous 1.7 0.5 - 1.9 mmol/L   DG Chest Port 1 View  Result Date: 04/30/2023 CLINICAL DATA:  Shortness of breath and headache EXAM: PORTABLE CHEST 1 VIEW COMPARISON:  12/26/2022 FINDINGS: Numerous leads and wires project over the chest. Remote left rib fractures. Midline trachea. Borderline cardiomegaly. Atherosclerosis in the transverse aorta. Left costophrenic angle excluded. No pleural effusion or pneumothorax. Chronic interstitial thickening/coarsening there is likely related to COPD/chronic bronchitis given clinical history of smoking. No lobar consolidation. IMPRESSION: No acute cardiopulmonary disease. Peribronchial thickening which may relate to chronic bronchitis or smoking. Aortic Atherosclerosis (ICD10-I70.0). Electronically Signed   By: Jeronimo Greaves M.D.   On: 04/30/2023 16:55   CT ABDOMEN PELVIS W CONTRAST  Result Date: 04/30/2023 CLINICAL DATA:  Right lower quadrant pain, no bowel movement since last Thursday, testicular pain EXAM: CT ABDOMEN AND PELVIS WITH CONTRAST TECHNIQUE: Multidetector CT imaging of the abdomen and pelvis was performed using the standard protocol following bolus administration of intravenous contrast. RADIATION DOSE REDUCTION: This exam was performed according to the departmental  dose-optimization program which includes automated exposure control, adjustment of the mA and/or kV according to patient size and/or use of iterative reconstruction technique. CONTRAST:  60mL OMNIPAQUE IOHEXOL 350 MG/ML SOLN COMPARISON:  11/18/2020 FINDINGS: Lower chest: Emphysema. No acute pleural or parenchymal lung disease. Hepatobiliary: No focal liver abnormality is seen. No gallstones, gallbladder wall thickening, or biliary dilatation. Pancreas: Unremarkable. No pancreatic ductal dilatation or surrounding inflammatory changes. Spleen: Normal in size without focal abnormality. Adrenals/Urinary Tract: Adrenal glands are unremarkable. Kidneys are normal, without renal calculi, focal lesion, or hydronephrosis. Bladder is unremarkable. Stomach/Bowel: There is a dilated inflamed appendix within the right lower quadrant, measuring up to 16 mm in diameter. And appendicolith is seen at the appendiceal orifice. Marked periappendiceal fat stranding with evidence of micro perforation at the base of the appendix. No fluid collection or abscess. No bowel obstruction or ileus. Vascular/Lymphatic: Subcentimeter right lower quadrant mesenteric lymph nodes are likely reactive. No pathologic adenopathy. Atherosclerosis of the aorta and its branches. Reproductive: Prostate is unremarkable. Other: Mesenteric edema and trace free fluid within the right lower quadrant. Punctate extraluminal gas adjacent to the base of the appendix consistent with micro perforation. No other evidence of pneumoperitoneum. No abdominal wall hernia. Musculoskeletal: No acute or destructive bony abnormalities. Reconstructed images demonstrate no additional findings. IMPRESSION: 1. Acute perforated appendicitis.  No fluid collection or abscess. 2.  Aortic Atherosclerosis (ICD10-I70.0). Critical Value/emergent results were called by telephone at the time of interpretation on 04/30/2023 at 4:29 pm to provider [facility], who verbally acknowledged these  results. Electronically Signed   By: Sharlet Salina M.D.   On: 04/30/2023 16:32   US  SCROTUM W/DOPPLER  Result Date: 04/30/2023 CLINICAL DATA:  Left testicular pain EXAM: SCROTAL ULTRASOUND DOPPLER ULTRASOUND OF THE TESTICLES TECHNIQUE: Complete ultrasound examination of the testicles, epididymis, and other scrotal structures was performed. Color and spectral Doppler ultrasound were also utilized to evaluate blood flow to the testicles. COMPARISON:  11/18/2020 FINDINGS: Right testicle Measurements: 2.5 x 4.0 x 2.2 cm. No mass or microlithiasis visualized. Left testicle Measurements: 2.1 x 3.9 x 1.7 cm. No mass or microlithiasis visualized. Right epididymis: Incidental 3 mm epididymal cyst of doubtful clinical significance. Otherwise normal size and appearance. Left epididymis: Incidental 2 mm epididymal cyst of doubtful clinical significance. Otherwise normal size and appearance. Hydrocele:  None visualized. Varicocele:  None visualized. Pulsed Doppler interrogation of both testes demonstrates normal low resistance arterial and venous waveforms bilaterally. IMPRESSION: 1. Unremarkable testicular ultrasound. 2. Small bilateral epididymal cysts of likely no clinical significance. Electronically Signed   By: Sharlet Salina M.D.   On: 04/30/2023 16:11   CT Head Wo Contrast  Result Date: 04/30/2023 CLINICAL DATA:  frontal HA, sepsis EXAM: CT HEAD WITHOUT CONTRAST TECHNIQUE: Contiguous axial images were obtained from the base of the skull through the vertex without intravenous contrast. RADIATION DOSE REDUCTION: This exam was performed according to the departmental dose-optimization program which includes automated exposure control, adjustment of the mA and/or kV according to patient size and/or use of iterative reconstruction technique. COMPARISON:  None Available. FINDINGS: Brain: No hemorrhage. No hydrocephalus. No extra-axial fluid collection. No CT evidence of an acute cortical infarct. No mass effect. No  mass lesion. Mineralization of the basal ganglia bilaterally. Vascular: No hyperdense vessel or unexpected calcification. Skull: Normal. Negative for fracture or focal lesion. Sinuses/Orbits: No middle ear effusion. There is a trace left mastoid effusion. Paranasal sinuses are clear. Bilateral lens replacement. Orbits are otherwise unremarkable. Other: None. IMPRESSION: 1. No CT finding to explain frontal headache. 2. Trace left mastoid effusion. Electronically Signed   By: Lorenza Cambridge M.D.   On: 04/30/2023 14:44    Pending Labs Unresulted Labs (From admission, onward)     Start     Ordered   05/01/23 0500  CBC  Tomorrow morning,   R        04/30/23 1909   05/01/23 0500  Basic metabolic panel  Tomorrow morning,   R        04/30/23 1909   04/30/23 1117  Blood Culture (routine x 2)  (Septic presentation on arrival (screening labs, nursing and treatment orders for obvious sepsis))  BLOOD CULTURE X 2,   STAT      04/30/23 1119   04/30/23 1117  Urinalysis, w/ Reflex to Culture (Infection Suspected) -Urine, Clean Catch  (Septic presentation on arrival (screening labs, nursing and treatment orders for obvious sepsis))  ONCE - URGENT,   URGENT       Question:  Specimen Source  Answer:  Urine, Clean Catch   04/30/23 1119            Vitals/Pain Today's Vitals   04/30/23 1650 04/30/23 1728 04/30/23 1820 04/30/23 1915  BP:  (!) 142/83  (!) 145/83  Pulse:  93    Resp:  18  20  Temp:  98.3 F (36.8 C)    TempSrc:  Oral    SpO2: 100% 99%    PainSc:   5      Isolation Precautions No active isolations  Medications Medications  lactated ringers infusion ( Intravenous New Bag/Given 04/30/23 1244)  morphine (PF) 4 MG/ML injection 4  mg (has no administration in time range)  aspirin EC tablet 81 mg (has no administration in time range)  atorvastatin (LIPITOR) tablet 80 mg (has no administration in time range)  ezetimibe (ZETIA) tablet 10 mg (has no administration in time range)  hydrALAZINE  (APRESOLINE) tablet 50 mg (has no administration in time range)  isosorbide mononitrate (IMDUR) 24 hr tablet 30 mg (has no administration in time range)  metoprolol succinate (TOPROL-XL) 24 hr tablet 50 mg (has no administration in time range)  DULoxetine (CYMBALTA) DR capsule 60 mg (has no administration in time range)  ferrous sulfate tablet 325 mg (has no administration in time range)  folic acid (FOLVITE) tablet 1 mg (has no administration in time range)  gabapentin (NEURONTIN) capsule 300 mg (has no administration in time range)  Budeson-Glycopyrrol-Formoterol 160-9-4.8 MCG/ACT AERO 2 puff (has no administration in time range)  pantoprazole (PROTONIX) EC tablet 40 mg (has no administration in time range)  acetaminophen (TYLENOL) tablet 650 mg (has no administration in time range)  piperacillin-tazobactam (ZOSYN) IVPB 3.375 g (has no administration in time range)  lactated ringers bolus 1,000 mL (0 mLs Intravenous Stopped 04/30/23 1550)  piperacillin-tazobactam (ZOSYN) IVPB 3.375 g (0 g Intravenous Stopped 04/30/23 1242)  acetaminophen (TYLENOL) tablet 650 mg (650 mg Oral Given 04/30/23 1155)  iohexol (OMNIPAQUE) 350 MG/ML injection 60 mL (60 mLs Intravenous Contrast Given 04/30/23 1258)  morphine (PF) 4 MG/ML injection 4 mg (4 mg Intravenous Given 04/30/23 1744)    Mobility walks     Focused Assessments    R Recommendations: See Admitting Provider Note  Report given to:   Additional Notes: n/a

## 2023-04-30 NOTE — ED Notes (Addendum)
Pt transported to CT ?

## 2023-04-30 NOTE — Progress Notes (Signed)
Pharmacy Antibiotic Note  Jared [name] is a 67 y.o. male admitted on 04/30/2023 presenting with appendicitis with microperforation.  Pharmacy has been consulted for zosyn dosing.  Plan: Zosyn 3.375g IV q 8h (extended 4h infusion) Monitor renal function, surgery plans and LOT     Temp (24hrs), Avg:99.4 F (37.4 C), Min:98.3 F (36.8 C), Max:100.9 F (38.3 C)  Recent Labs  Lab 04/30/23 1027 04/30/23 1251 04/30/23 1333  WBC 13.4*  --   --   CREATININE 1.65*  --   --   LATICACIDVEN  --  2.9* 1.7    Estimated Creatinine Clearance: 50.5 mL/min (A) (by C-G formula based on SCr of 1.65 mg/dL (H)).    Allergies  Allergen Reactions   Lisinopril Cough    Daylene Posey, PharmD, [facility] Clinical Pharmacist ED Pharmacist Phone # [phone] 04/30/2023 7:15 PM

## 2023-04-30 NOTE — H&P (Signed)
History and Physical    Patient: Jared [name] WUJ:[id number] DOB: [birth year] DOA: 04/30/2023 DOS: the patient was seen and examined on 04/30/2023 PCP: [name], MD  Patient coming from: Home  Chief Complaint:  Chief Complaint  Patient presents with   Shortness of Breath   Headache   Constipation   Testicle Pain   Nasal Congestion   HPI: Jared [name] is a 67 y.o. male with medical history significant of combined systolic and diastolic CHF, hypertension, CVA, atrial flutter on Xarelto, CAD, AVM, PVD and COPD with chronic hypoxic respiratory failure on 2L who presents with numerous complaints including headache, abdominal, testicular pain.  Patient reports having lower abdominal pain for years but became acutely worse this week to his left lower abdomen radiating to his right.  Also has left testicular pain.  At this week also had a persistent headache.  Has decreased appetite.  Denies any nausea, vomiting or diarrhea.  Has not had a bowel movement for close to a week. He also reports episodes of syncope with loss of consciousness that has been ongoing intermittently for the past 2 years.  Reports sometimes feeling dizzy and often times happened when he stands up.  He would start to shake and then lose consciousness for few seconds.  Denies any loss of bladder or bowel incontinence. He was getting x-ray earlier in the ED and had an episode where he stood up and lost consciousness.  On arrival to the ED, he was febrile up to 100.9 F, normotensive and on home chronic 2 L oxygen supplementation.  CBC with leukocytosis of 13.4, hemoglobin 10.1.  Initial lactate of 2.9.  BMP notable for AKI with elevated creatinine of 1.65.  Mildly elevated troponin at 36 which is similar to prior  Negative influenza/COVID/RSV.  Negative UA.  Negative CT head  Scrotal ultrasound was unremarkable.  CT abdomen pelvis with acute perforated appendicitis with microperforation.  No fluid collection or  abscess.  ED PA consulted general surgery who recommended continuing IV antibiotics and keeping n.p.o. pending Xarelto washout and more urgent intervention.  Hospitalist then consulted for admission. Review of Systems: As mentioned in the history of present illness. All other systems reviewed and are negative. Past Medical History:  Diagnosis Date   AVM (arteriovenous malformation)    CAD (coronary artery disease)    a. LHC 5/12:  LAD 20, pLCx 20, pRCA 40, dRCA 40, EF 35%, diff HK  //  b. Myoview 4/16: Overall Impression:  High risk stress nuclear study There is no evidence of ischemia.  There is severe LV dysfunction. LV Ejection Fraction: 30%.  LV Wall Motion:  There is global LV hypokinesis.     CAP (community acquired pneumonia) 09/2013   Chronic combined systolic and diastolic CHF (congestive heart failure) (HCC)    a. Echo 4/16:Mild LVH, EF 40-45%, diffuse HK //  b. Echo 8/17: EF 35-40%, diffuse HK, diastolic dysfunction, aortic sclerosis, trivial MR, moderate LAE, normal RVSF, moderate RAE, mild TR, PASP 42 mmHg // c. Echo 4/18: Mild concentric LVH, EF 30-35, normal wall motion, grade 1 diastolic dysfunction, PASP 49   Chronic respiratory failure (HCC)    Cluster headache    "hx; haven't had one in awhile" (01/09/2016)   COPD (chronic obstructive pulmonary disease) (HCC)    [name] 01/09/2016   DM (diabetes mellitus) (HCC)    History of CVA (cerebrovascular accident)    Hypertension    IDA (iron deficiency anemia)    Moderate tobacco use disorder  NICM (nonischemic cardiomyopathy) (HCC)    Nicotine addiction    Tobacco abuse    Type 2 diabetes mellitus (HCC) 05/14/2016   Past Surgical History:  Procedure Laterality Date   ABDOMINAL AORTOGRAM W/LOWER EXTREMITY N/A 12/07/2022   Procedure: ABDOMINAL AORTOGRAM W/LOWER EXTREMITY;  Surgeon: Leonie Douglas, MD;  Location: [facility];  Service: Cardiovascular;  Laterality: N/A;   BIOPSY  11/12/2020   Procedure: BIOPSY;  Surgeon:  Lemar Lofty., MD;  Location: [facility] ENDOSCOPY;  Service: Gastroenterology;;   CARDIAC CATHETERIZATION  10/2010   LM normal, LAD with 20% irregularities, LCX with 20%, RCA with 40% prox and 40% distal - EF of 35%   CATARACT EXTRACTION, BILATERAL     COLONOSCOPY W/ BIOPSIES AND POLYPECTOMY     COLONOSCOPY WITH PROPOFOL N/A 09/06/2018   Procedure: COLONOSCOPY WITH PROPOFOL;  Surgeon: Tressia Danas, MD;  Location: [facility] ENDOSCOPY;  Service: Gastroenterology;  Laterality: N/A;   ENTEROSCOPY N/A 09/28/2018   Procedure: ENTEROSCOPY;  Surgeon: Jeani Hawking, MD;  Location: [facility] ENDOSCOPY;  Service: Endoscopy;  Laterality: N/A;   ENTEROSCOPY N/A 10/28/2018   Procedure: ENTEROSCOPY;  Surgeon: Tressia Danas, MD;  Location: [facility] ENDOSCOPY;  Service: Gastroenterology;  Laterality: N/A;   ENTEROSCOPY N/A 10/09/2020   Procedure: ENTEROSCOPY;  Surgeon: Hilarie Fredrickson, MD;  Location: [facility] ENDOSCOPY;  Service: Endoscopy;  Laterality: N/A;   ENTEROSCOPY N/A 03/22/2022   Procedure: ENTEROSCOPY;  Surgeon: Benancio Deeds, MD;  Location: [facility] ENDOSCOPY;  Service: Gastroenterology;  Laterality: N/A;   ESOPHAGOGASTRODUODENOSCOPY N/A 11/12/2020   Procedure: ESOPHAGOGASTRODUODENOSCOPY (EGD);  Surgeon: Lemar Lofty., MD;  Location: [facility] ENDOSCOPY;  Service: Gastroenterology;  Laterality: N/A;   ESOPHAGOGASTRODUODENOSCOPY (EGD) WITH PROPOFOL N/A 09/05/2018   Procedure: ESOPHAGOGASTRODUODENOSCOPY (EGD) WITH PROPOFOL;  Surgeon: Benancio Deeds, MD;  Location: [facility] ENDOSCOPY;  Service: Gastroenterology;  Laterality: N/A;   ESOPHAGOGASTRODUODENOSCOPY (EGD) WITH PROPOFOL N/A 11/19/2020   Procedure: ESOPHAGOGASTRODUODENOSCOPY (EGD) WITH PROPOFOL;  Surgeon: Beverley Fiedler, MD;  Location: [facility] ENDOSCOPY;  Service: Gastroenterology;  Laterality: N/A;   ESOPHAGOGASTRODUODENOSCOPY (EGD) WITH PROPOFOL N/A 12/27/2022   Procedure: ESOPHAGOGASTRODUODENOSCOPY (EGD) WITH PROPOFOL;  Surgeon: Napoleon Form, MD;  Location: [facility]  ENDOSCOPY;  Service: Gastroenterology;  Laterality: N/A;   EXCISION MASS HEAD     GIVENS CAPSULE STUDY N/A 09/06/2018   Procedure: GIVENS CAPSULE STUDY;  Surgeon: Tressia Danas, MD;  Location: [facility] ENDOSCOPY;  Service: Gastroenterology;  Laterality: N/A;   GIVENS CAPSULE STUDY N/A 09/26/2018   Procedure: GIVENS CAPSULE STUDY;  Surgeon: Beverley Fiedler, MD;  Location: [facility] ENDOSCOPY;  Service: Gastroenterology;  Laterality: N/A;   GIVENS CAPSULE STUDY N/A 06/14/2019   Procedure: GIVENS CAPSULE STUDY;  Surgeon: Napoleon Form, MD;  Location: [facility] ENDOSCOPY;  Service: Endoscopy;  Laterality: N/A;   HEMOSTASIS CLIP PLACEMENT  11/12/2020   Procedure: HEMOSTASIS CLIP PLACEMENT;  Surgeon: Lemar Lofty., MD;  Location: [facility] ENDOSCOPY;  Service: Gastroenterology;;   HEMOSTASIS CONTROL  11/12/2020   Procedure: HEMOSTASIS CONTROL;  Surgeon: Lemar Lofty., MD;  Location: [facility] ENDOSCOPY;  Service: Gastroenterology;;   HOT HEMOSTASIS N/A 10/28/2018   Procedure: HOT HEMOSTASIS (ARGON PLASMA COAGULATION/BICAP);  Surgeon: Tressia Danas, MD;  Location: [facility] ENDOSCOPY;  Service: Gastroenterology;  Laterality: N/A;   HOT HEMOSTASIS N/A 11/12/2020   Procedure: HOT HEMOSTASIS (ARGON PLASMA COAGULATION/BICAP);  Surgeon: Lemar Lofty., MD;  Location: [facility] ENDOSCOPY;  Service: Gastroenterology;  Laterality: N/A;   HOT HEMOSTASIS N/A 03/22/2022   Procedure: HOT HEMOSTASIS (ARGON PLASMA COAGULATION/BICAP);  Surgeon: Benancio Deeds, MD;  Location: [facility] ENDOSCOPY;  Service: Gastroenterology;  Laterality: N/A;   INCISION AND DRAINAGE PERIRECTAL ABSCESS N/A 06/05/2017   Procedure: IRRIGATION AND DEBRIDEMENT PERIRECTAL ABSCESS;  Surgeon: Andria Meuse, MD;  Location: [facility] OR;  Service: General;  Laterality: N/A;   PERIPHERAL VASCULAR INTERVENTION  12/07/2022   Procedure: PERIPHERAL VASCULAR INTERVENTION;  Surgeon: Leonie Douglas, MD;  Location: [facility];  Service: Cardiovascular;;    SUBMUCOSAL TATTOO INJECTION  11/12/2020   Procedure: SUBMUCOSAL TATTOO INJECTION;  Surgeon: Lemar Lofty., MD;  Location: [facility] ENDOSCOPY;  Service: Gastroenterology;;   VIDEO BRONCHOSCOPY Bilateral 05/08/2016   Procedure: VIDEO BRONCHOSCOPY WITH FLUORO;  Surgeon: Oretha Milch, MD;  Location: [facility] ENDOSCOPY;  Service: Cardiopulmonary;  Laterality: Bilateral;   Social History:  reports that he has been smoking cigarettes. He has a 23.5 pack-year smoking history. He has never used smokeless tobacco. He reports that he does not currently use alcohol. He reports that he does not use drugs.  Allergies  Allergen Reactions   Lisinopril Cough    Family History  Problem Relation Age of Onset   Heart disease Mother    Diabetes Mother    Colon cancer Mother    Liver cancer Mother    Cancer Father        type unknown   Diabetes Sister        x 2   Diabetes Brother     Prior to Admission medications   Medication Sig Start Date End Date Taking? Authorizing Provider  acetaminophen (TYLENOL) 500 MG tablet Take 2 tablets (1,000 mg total) by mouth every 8 (eight) hours as needed for moderate pain. 01/03/23  Yes Newlin, Enobong, MD  albuterol (VENTOLIN HFA) 108 (90 Base) MCG/ACT inhaler INHALE 2 PUFFS INTO THE LUNGS EVERY 6 (SIX) HOURS AS NEEDED FOR WHEEZING OR SHORTNESS OF BREATH. Patient taking differently: Inhale 2 puffs into the lungs daily as needed for wheezing or shortness of breath. 12/06/22  Yes [name], MD  aspirin EC 81 MG tablet Take 1 tablet (81 mg total) by mouth daily. Swallow whole. 12/07/22  Yes Leonie Douglas, MD  atorvastatin (LIPITOR) 80 MG tablet Take 1 tablet (80 mg total) by mouth daily. 02/04/23  Yes [name], MD  DULoxetine (CYMBALTA) 60 MG capsule Take 1 capsule (60 mg total) by mouth daily. For chronic leg pains 01/09/23  Yes [name], MD  ergocalciferol (DRISDOL) 1.25 MG (50000 UT) capsule Take 1 capsule (50,000 Units total) by mouth once a week.  02/26/23  Yes [name], MD  ezetimibe (ZETIA) 10 MG tablet Take 1 tablet (10 mg total) by mouth daily. 01/31/23  Yes Pricilla Riffle, MD  ferrous sulfate (FEROSUL) 325 (65 FE) MG tablet Take 1 tablet (325 mg total) by mouth daily with breakfast. 01/30/23  Yes Danis, Andreas Blower, MD  folic acid (FOLVITE) 1 MG tablet Take 1 tablet (1 mg total) by mouth daily. 01/30/23  Yes [name], MD  furosemide (LASIX) 40 MG tablet Take 1-2 tablets (40-80 mg total) by mouth 2 (two) times daily as needed.Take an extra pill with worsening pedal edema. 01/09/23  Yes [name], MD  gabapentin (NEURONTIN) 300 MG capsule Take 1 capsule (300 mg total) by mouth 2 (two) times daily. 02/25/23  Yes Kathryne Hitch, MD  hydrALAZINE (APRESOLINE) 50 MG tablet Take 1 tablet (50 mg total) by mouth every 8 (eight) hours. 02/04/23  Yes [name], MD  isosorbide mononitrate (IMDUR) 30 MG 24 hr tablet Take  1 tablet (30 mg total) by mouth daily. 01/30/23  Yes [name], MD  metoprolol succinate (TOPROL-XL) 50 MG 24 hr tablet Take 1 tablet (50 mg total) by mouth daily. Take with or immediately following a meal. 02/04/23  Yes Newlin, Enobong, MD  nitroGLYCERIN (NITROSTAT) 0.4 MG SL tablet Place 1 tablet (0.4 mg total) under the tongue every 5 (five) minutes as needed for chest pain. 11/19/20 04/30/23 Yes Albertine Grates, MD  OXYGEN Inhale 2 L/min into the lungs continuous.   Yes [provider]  pantoprazole (PROTONIX) 40 MG tablet Take 1 tablet (40 mg total) by mouth daily. 02/04/23  Yes [name], MD  rivaroxaban (XARELTO) 20 MG TABS tablet Take 1 tablet (20 mg total) by mouth daily with supper. 10/25/22  Yes [name], MD  Accu-Chek Softclix Lancets lancets Use to check blood sugar once daily. 01/09/23   [name], MD  Blood Glucose Monitoring Suppl (ACCU-CHEK GUIDE) w/Device KIT Use to check blood sugar once daily. 01/30/23   [name], MD  Budeson-Glycopyrrol-Formoterol (BREZTRI  AEROSPHERE) 160-9-4.8 MCG/ACT AERO Take 2 puffs first thing in morning and then another 2 puffs about 12 hours later. 02/25/23   Nyoka Cowden, MD  glucose blood (ACCU-CHEK GUIDE) test strip Use to check blood sugar once daily. 01/09/23   [name], MD  TUDORZA PRESSAIR 400 MCG/ACT AEPB INHALE 1 PUFF INTO THE LUNGS IN THE MORNING AND AT BEDTIME. 03/29/20 04/04/20  Nyoka Cowden, MD    Physical Exam: Vitals:   04/30/23 1645 04/30/23 1650 04/30/23 1728 04/30/23 1915  BP: 131/81  (!) 142/83 (!) 145/83  Pulse:   93   Resp: 18  18 20   Temp:   98.3 F (36.8 C)   TempSrc:   Oral   SpO2: 100% 100% 99%    Constitutional: NAD, calm, comfortable, well-appearing male lying in bed Eyes:  lids and conjunctivae normal ENMT: Mucous membranes are moist.  Neck: normal, supple Respiratory: Diffuse faint expiratory wheezing.  Normal respiratory effort. No accessory muscle use.  On 2 L via nasal cannula oxygen supplementation Cardiovascular: Regular rate and rhythm, no murmurs / rubs / gallops. No extremity edema.  Abdomen: Soft, nondistended, no tenderness, no rebound tenderness guarding or rigidity  musculoskeletal: no clubbing / cyanosis. No joint deformity upper and lower extremities. Normal muscle tone.  Skin: no rashes, lesions, ulcers. No induration Neurologic: CN 2-12 grossly intact.  Psychiatric: Normal judgment and insight. Alert and oriented x 3. Normal mood. Data Reviewed:  See HPI  Assessment and Plan: * Sepsis (HCC) - Secondary to acute perforated appendicitis with microperforation.  Currently abdomen is benign. - Surgery has consulted and recommended holding Xarelto and keeping n.p.o. except for sips with meds for consideration for surgery once Xarelto wears off  - Continue IV Zosyn - Keep on continuous IV fluids overnight - Blood cultures pending  Syncope and collapse -Patient reports 2 years of intermittent generalized body shaking with syncope and loss of consciousness  that are often positional when going from sitting to standing. -Last echocardiogram was 12/2021 with EF of 45 to 50% and global hypokinesis, grade 1 diastolic dysfunction.  No significant valvular abnormality. -check orthostatic vital signs -check UDS, TSH -obtain repeat echo and keep on telemetry   Chronic respiratory failure with hypoxia (HCC) Secondary to COPD on 2L -stable although with wheezing on exam -PRN duoneb -Continue home bronchodilator  Essential hypertension - Continue hydralazine, Imdur, metoprolol.  Holding Lasix due to AKI  Atrial flutter (HCC) - Continue  beta-blocker - Holding Xarelto per surgery to assess whether he will need intervention for perforated appendicitis.  Last dose was this morning on 11/19.  History of CVA (cerebrovascular accident) - Continue aspirin - Xarelto currently on hold pending need for surgical intervention  Peripheral vascular disease (HCC) - Continue aspirin  AKI (acute kidney injury) (HCC) - Prerenal AKI with Elevated creatinine of 1.69 from prior of 1 -Keep on continuous IV fluids overnight follow trend in the morning  Tobacco use - Ongoing use of close to 1 pack/day - Nicotine patch offered      Advance Care Planning: Full  Consults: General Surgery  Family Communication: None at bedside  Severity of Illness: The appropriate patient status for this patient is INPATIENT. Inpatient status is judged to be reasonable and necessary in order to provide the required intensity of service to ensure the patient's safety. The patient's presenting symptoms, physical exam findings, and initial radiographic and laboratory data in the context of their chronic comorbidities is felt to place them at high risk for further clinical deterioration. Furthermore, it is not anticipated that the patient will be medically stable for discharge from the hospital within 2 midnights of admission.   * I certify that at the point of admission it is my  clinical judgment that the patient will require inpatient hospital care spanning beyond 2 midnights from the point of admission due to high intensity of service, high risk for further deterioration and high frequency of surveillance required.*  Author: Anselm Jungling, DO 04/30/2023 7:40 PM  For on call review [email or web address].

## 2023-04-30 NOTE — Assessment & Plan Note (Signed)
-   Continue beta-blocker - Holding Xarelto per surgery to assess whether he will need intervention for perforated appendicitis.  Last dose was this morning on 11/19.

## 2023-04-30 NOTE — ED Notes (Signed)
This nurse discussed with the provider about the patient experiencing a run a V-Tach at 1819 on EKG. Was not notified by central monitoring.

## 2023-04-30 NOTE — ED Provider Notes (Signed)
  Physical Exam  BP (!) 142/83   Pulse 93   Temp 98.3 F (36.8 C) (Oral)   Resp 18   SpO2 99%   Physical Exam  Procedures  Procedures  ED Course / MDM    Medical Decision Making Amount and/or Complexity of Data Reviewed Labs: ordered. Radiology: ordered.  Risk OTC drugs. Prescription drug management. Decision regarding hospitalization.   Patient care taken over at shift change.  He has an AKI and is septic.  Holding off on consult until CT abdomen/pelvis and scrotal ultrasound results return.  Received a call from Dr. Manson Passey with radiology.  Patient has appendicitis with a microperforation at the base of the appendix.  No fluid collection or abscess.  Scrotal ultrasound is unremarkable.  Small bilateral epididymal cysts of likely no clinical significance.  Chest x-ray shows no acute cardiopulmonary disease.  Peribronchial thickening which may relate to chronic bronchitis or smoking.  CT head is unremarkable.  I spoke with Dr. Janee Morn with general surgery.  He states that since patient is on Xarelto that he will need to get admitted to the hospitalist service so the medication can wear off prior to possible surgery.  Dr. Rhona Leavens with [facility] agreed to admit patient.    Maxwell Marion, PA-C 04/30/23 1849    Franne Forts, DO 05/11/23 1046

## 2023-04-30 NOTE — Assessment & Plan Note (Signed)
-   Prerenal AKI with Elevated creatinine of 1.69 from prior of 1 -Keep on continuous IV fluids overnight follow trend in the morning

## 2023-04-30 NOTE — Progress Notes (Signed)
ED Pharmacy Antibiotic Sign Off An antibiotic consult was received from an ED provider for Zosyn (piperacillin-tazobactam) per pharmacy dosing for sepsis. A chart review was completed to assess appropriateness.   The following one time order(s) were placed:  Zosyn 3.375g IV x1 over 30 minutes  Further antibiotic and/or antibiotic pharmacy consults should be ordered by the admitting provider if indicated.   Thank you for allowing pharmacy to be a part of this patient's care.   Wilburn Cornelia, PharmD, BCPS Clinical Pharmacist 04/30/2023 11:32 AM   Please refer to [facility] for pharmacy phone number

## 2023-04-30 NOTE — Assessment & Plan Note (Addendum)
Secondary to COPD on 2L -stable although with wheezing on exam -PRN duoneb -Continue home bronchodilator

## 2023-04-30 NOTE — ED Notes (Signed)
Pt. Brought back from Xray and they stated he had a syncope episode, Brought back to Triage 2 B

## 2023-04-30 NOTE — Progress Notes (Signed)
Pt admitted to unit, oriented to surroundings and call light system. Plan of care reviewed with pt, as well as fall protocol. Bed alarm activated, call light left within reach.

## 2023-04-30 NOTE — Assessment & Plan Note (Addendum)
-   Secondary to acute perforated appendicitis with microperforation.  Currently abdomen is benign. - Surgery has consulted and recommended holding Xarelto and keeping n.p.o. except for sips with meds for consideration for surgery once Xarelto wears off  - Continue IV Zosyn - Keep on continuous IV fluids overnight - Blood cultures pending

## 2023-04-30 NOTE — Assessment & Plan Note (Addendum)
-  Patient reports 2 years of intermittent generalized body shaking with syncope and loss of consciousness that are often positional when going from sitting to standing. -Last echocardiogram was 12/2021 with EF of 45 to 50% and global hypokinesis, grade 1 diastolic dysfunction.  No significant valvular abnormality. -check orthostatic vital signs -check UDS, TSH -obtain repeat echo and keep on telemetry

## 2023-04-30 NOTE — Assessment & Plan Note (Signed)
-   Continue aspirin - Xarelto currently on hold pending need for surgical intervention

## 2023-04-30 NOTE — Consult Note (Signed)
Reason for Consult:appendicitis with microperforation Referring Physician: Maxwell Marion  Jared [name] is an 67 y.o. male.  HPI: 67yo M with past medical history of CAD, COPD (2L home O2), CHF, DM, HTN, and CVA on Xarelto (last dose last night) presented to the emergency department with 1 week history of lower abdominal pain which radiates over to his right lower quadrant.  He underwent workup in the emergency department and was found to have acute kidney injury and signs of possible sepsis.  CT scan of the abdomen pelvis demonstrated acute appendicitis with microperforation.  I was asked to see him for surgical management.  Past Medical History:  Diagnosis Date   AVM (arteriovenous malformation)    CAD (coronary artery disease)    a. LHC 5/12:  LAD 20, pLCx 20, pRCA 40, dRCA 40, EF 35%, diff HK  //  b. Myoview 4/16: Overall Impression:  High risk stress nuclear study There is no evidence of ischemia.  There is severe LV dysfunction. LV Ejection Fraction: 30%.  LV Wall Motion:  There is global LV hypokinesis.     CAP (community acquired pneumonia) 09/2013   Chronic combined systolic and diastolic CHF (congestive heart failure) (HCC)    a. Echo 4/16:Mild LVH, EF 40-45%, diffuse HK //  b. Echo 8/17: EF 35-40%, diffuse HK, diastolic dysfunction, aortic sclerosis, trivial MR, moderate LAE, normal RVSF, moderate RAE, mild TR, PASP 42 mmHg // c. Echo 4/18: Mild concentric LVH, EF 30-35, normal wall motion, grade 1 diastolic dysfunction, PASP 49   Chronic respiratory failure (HCC)    Cluster headache    "hx; haven't had one in awhile" (01/09/2016)   COPD (chronic obstructive pulmonary disease) (HCC)    [name] 01/09/2016   DM (diabetes mellitus) (HCC)    History of CVA (cerebrovascular accident)    Hypertension    IDA (iron deficiency anemia)    Moderate tobacco use disorder    NICM (nonischemic cardiomyopathy) (HCC)    Nicotine addiction    Tobacco abuse    Type 2 diabetes mellitus (HCC) 05/14/2016     Past Surgical History:  Procedure Laterality Date   ABDOMINAL AORTOGRAM W/LOWER EXTREMITY N/A 12/07/2022   Procedure: ABDOMINAL AORTOGRAM W/LOWER EXTREMITY;  Surgeon: Leonie Douglas, MD;  [facility];  Service: Cardiovascular;  Laterality: N/A;   BIOPSY  11/12/2020   Procedure: BIOPSY;  Surgeon: Lemar Lofty., MD;  Location: [facility] ENDOSCOPY;  Service: Gastroenterology;;   CARDIAC CATHETERIZATION  10/2010   LM normal, LAD with 20% irregularities, LCX with 20%, RCA with 40% prox and 40% distal - EF of 35%   CATARACT EXTRACTION, BILATERAL     COLONOSCOPY W/ BIOPSIES AND POLYPECTOMY     COLONOSCOPY WITH PROPOFOL N/A 09/06/2018   Procedure: COLONOSCOPY WITH PROPOFOL;  Surgeon: Tressia Danas, MD;  Location: [facility] ENDOSCOPY;  Service: Gastroenterology;  Laterality: N/A;   ENTEROSCOPY N/A 09/28/2018   Procedure: ENTEROSCOPY;  Surgeon: Jeani Hawking, MD;  Location: [facility] ENDOSCOPY;  Service: Endoscopy;  Laterality: N/A;   ENTEROSCOPY N/A 10/28/2018   Procedure: ENTEROSCOPY;  Surgeon: Tressia Danas, MD;  Location: [facility] ENDOSCOPY;  Service: Gastroenterology;  Laterality: N/A;   ENTEROSCOPY N/A 10/09/2020   Procedure: ENTEROSCOPY;  Surgeon: Hilarie Fredrickson, MD;  Location: [facility] ENDOSCOPY;  Service: Endoscopy;  Laterality: N/A;   ENTEROSCOPY N/A 03/22/2022   Procedure: ENTEROSCOPY;  Surgeon: Benancio Deeds, MD;  Location: [facility] ENDOSCOPY;  Service: Gastroenterology;  Laterality: N/A;   ESOPHAGOGASTRODUODENOSCOPY N/A 11/12/2020   Procedure: ESOPHAGOGASTRODUODENOSCOPY (EGD);  Surgeon:  Mansouraty, Netty Starring., MD;  Location: [facility] ENDOSCOPY;  Service: Gastroenterology;  Laterality: N/A;   ESOPHAGOGASTRODUODENOSCOPY (EGD) WITH PROPOFOL N/A 09/05/2018   Procedure: ESOPHAGOGASTRODUODENOSCOPY (EGD) WITH PROPOFOL;  Surgeon: Benancio Deeds, MD;  Location: [facility] ENDOSCOPY;  Service: Gastroenterology;  Laterality: N/A;   ESOPHAGOGASTRODUODENOSCOPY (EGD) WITH PROPOFOL N/A 11/19/2020   Procedure:  ESOPHAGOGASTRODUODENOSCOPY (EGD) WITH PROPOFOL;  Surgeon: Beverley Fiedler, MD;  Location: [facility] ENDOSCOPY;  Service: Gastroenterology;  Laterality: N/A;   ESOPHAGOGASTRODUODENOSCOPY (EGD) WITH PROPOFOL N/A 12/27/2022   Procedure: ESOPHAGOGASTRODUODENOSCOPY (EGD) WITH PROPOFOL;  Surgeon: Napoleon Form, MD;  Location: [facility] ENDOSCOPY;  Service: Gastroenterology;  Laterality: N/A;   EXCISION MASS HEAD     GIVENS CAPSULE STUDY N/A 09/06/2018   Procedure: GIVENS CAPSULE STUDY;  Surgeon: Tressia Danas, MD;  Location: [facility] ENDOSCOPY;  Service: Gastroenterology;  Laterality: N/A;   GIVENS CAPSULE STUDY N/A 09/26/2018   Procedure: GIVENS CAPSULE STUDY;  Surgeon: Beverley Fiedler, MD;  Location: [facility] ENDOSCOPY;  Service: Gastroenterology;  Laterality: N/A;   GIVENS CAPSULE STUDY N/A 06/14/2019   Procedure: GIVENS CAPSULE STUDY;  Surgeon: Napoleon Form, MD;  Location: [facility] ENDOSCOPY;  Service: Endoscopy;  Laterality: N/A;   HEMOSTASIS CLIP PLACEMENT  11/12/2020   Procedure: HEMOSTASIS CLIP PLACEMENT;  Surgeon: Lemar Lofty., MD;  Location: [facility] ENDOSCOPY;  Service: Gastroenterology;;   HEMOSTASIS CONTROL  11/12/2020   Procedure: HEMOSTASIS CONTROL;  Surgeon: Lemar Lofty., MD;  Location: [facility] ENDOSCOPY;  Service: Gastroenterology;;   HOT HEMOSTASIS N/A 10/28/2018   Procedure: HOT HEMOSTASIS (ARGON PLASMA COAGULATION/BICAP);  Surgeon: Tressia Danas, MD;  Location: [facility] ENDOSCOPY;  Service: Gastroenterology;  Laterality: N/A;   HOT HEMOSTASIS N/A 11/12/2020   Procedure: HOT HEMOSTASIS (ARGON PLASMA COAGULATION/BICAP);  Surgeon: Lemar Lofty., MD;  Location: [facility] ENDOSCOPY;  Service: Gastroenterology;  Laterality: N/A;   HOT HEMOSTASIS N/A 03/22/2022   Procedure: HOT HEMOSTASIS (ARGON PLASMA COAGULATION/BICAP);  Surgeon: Benancio Deeds, MD;  Location: [facility] ENDOSCOPY;  Service: Gastroenterology;  Laterality: N/A;   INCISION AND DRAINAGE PERIRECTAL ABSCESS N/A 06/05/2017   Procedure: IRRIGATION  AND DEBRIDEMENT PERIRECTAL ABSCESS;  Surgeon: Andria Meuse, MD;  Location: [facility] OR;  Service: General;  Laterality: N/A;   PERIPHERAL VASCULAR INTERVENTION  12/07/2022   Procedure: PERIPHERAL VASCULAR INTERVENTION;  Surgeon: Leonie Douglas, MD;  [facility];  Service: Cardiovascular;;   SUBMUCOSAL TATTOO INJECTION  11/12/2020   Procedure: SUBMUCOSAL TATTOO INJECTION;  Surgeon: Lemar Lofty., MD;  Location: [facility] ENDOSCOPY;  Service: Gastroenterology;;   VIDEO BRONCHOSCOPY Bilateral 05/08/2016   Procedure: VIDEO BRONCHOSCOPY WITH FLUORO;  Surgeon: Oretha Milch, MD;  Location: [facility] ENDOSCOPY;  Service: Cardiopulmonary;  Laterality: Bilateral;    Family History  Problem Relation Age of Onset   Heart disease Mother    Diabetes Mother    Colon cancer Mother    Liver cancer Mother    Cancer Father        type unknown   Diabetes Sister        x 2   Diabetes Brother     Social History:  reports that he has been smoking cigarettes. He has a 23.5 pack-year smoking history. He has never used smokeless tobacco. He reports that he does not currently use alcohol. He reports that he does not use drugs.  Allergies:  Allergies  Allergen Reactions   Lisinopril Cough    Medications: I have reviewed the patient's current medications.  Results for orders placed or performed during the hospital encounter of 04/30/23 (  from the past 48 hour(s))  Basic metabolic panel     Status: Abnormal   Collection Time: 04/30/23 10:27 AM  Result Value Ref Range   Sodium 135 135 - 145 mmol/L   Potassium 3.6 3.5 - 5.1 mmol/L   Chloride 98 98 - 111 mmol/L   CO2 27 22 - 32 mmol/L   Glucose, Bld 129 (H) 70 - 99 mg/dL    Comment: Glucose reference range applies only to samples taken after fasting for at least 8 hours.   BUN 12 8 - 23 mg/dL   Creatinine, Ser 1.61 (H) 0.61 - 1.24 mg/dL   Calcium 8.5 (L) 8.9 - 10.3 mg/dL   GFR, Estimated 45 (L) >60 mL/min    Comment: (NOTE) Calculated  using the CKD-EPI Creatinine Equation (2021)    Anion gap 10 5 - 15    Comment: Performed at [facility], [address], [place], [place] [address]  CBC     Status: Abnormal   Collection Time: 04/30/23 10:27 AM  Result Value Ref Range   WBC 13.4 (H) 4.0 - 10.5 K/uL   RBC 3.81 (L) 4.22 - 5.81 MIL/uL   Hemoglobin 10.1 (L) 13.0 - 17.0 g/dL   HCT 54.0 (L) 98.1 - 19.1 %   MCV 90.8 80.0 - 100.0 fL   MCH 26.5 26.0 - 34.0 pg   MCHC 29.2 (L) 30.0 - 36.0 g/dL   RDW 47.8 (H) 29.5 - 62.1 %   Platelets 306 150 - 400 K/uL   nRBC 0.0 0.0 - 0.2 %    Comment: Performed at [facility], [address] [place], [place] [address]  Urinalysis, Routine w reflex microscopic -Urine, Random     Status: Abnormal   Collection Time: 04/30/23 10:27 AM  Result Value Ref Range   Color, Urine YELLOW YELLOW   APPearance HAZY (A) CLEAR   Specific Gravity, Urine 1.013 1.005 - 1.030   pH 5.0 5.0 - 8.0   Glucose, UA NEGATIVE NEGATIVE mg/dL   Hgb urine dipstick NEGATIVE NEGATIVE   Bilirubin Urine NEGATIVE NEGATIVE   Ketones, ur NEGATIVE NEGATIVE mg/dL   Protein, ur NEGATIVE NEGATIVE mg/dL   Nitrite NEGATIVE NEGATIVE   Leukocytes,Ua NEGATIVE NEGATIVE    Comment: Performed at [facility], [address] [place], [place] [address]  Resp panel by RT-PCR (RSV, Flu A&B, Covid) Anterior Nasal Swab     Status: None   Collection Time: 04/30/23 11:17 AM   Specimen: Anterior Nasal Swab  Result Value Ref Range   SARS Coronavirus 2 by RT PCR NEGATIVE NEGATIVE   Influenza A by PCR NEGATIVE NEGATIVE   Influenza B by PCR NEGATIVE NEGATIVE    Comment: (NOTE) The Xpert Xpress SARS-CoV-2/FLU/RSV plus assay is intended as an aid in the diagnosis of influenza from Nasopharyngeal swab specimens and should not be used as a sole basis for treatment. Nasal washings and aspirates are unacceptable for Xpert Xpress SARS-CoV-2/FLU/RSV testing.  Fact Sheet for  Patients: BloggerCourse.com  Fact Sheet for Healthcare Providers: SeriousBroker.it  This test is not yet approved or cleared by the [place] FDA and has been authorized for detection and/or diagnosis of SARS-CoV-2 by FDA under an Emergency Use Authorization (EUA). This EUA will remain in effect (meaning this test can be used) for the duration of the COVID-19 declaration under Section 564(b)(1) of the Act, 21 U.S.C. section 360bbb-3(b)(1), unless the authorization is terminated or revoked.     Resp Syncytial Virus by PCR  NEGATIVE NEGATIVE    Comment: (NOTE) Fact Sheet for Patients: BloggerCourse.com  Fact Sheet for Healthcare Providers: SeriousBroker.it  This test is not yet approved or cleared by the [place] FDA and has been authorized for detection and/or diagnosis of SARS-CoV-2 by FDA under an Emergency Use Authorization (EUA). This EUA will remain in effect (meaning this test can be used) for the duration of the COVID-19 declaration under Section 564(b)(1) of the Act, 21 U.S.C. section 360bbb-3(b)(1), unless the authorization is terminated or revoked.  Performed at [facility], [address] [place], [place] [address]   Protime-INR     Status: Abnormal   Collection Time: 04/30/23 11:44 AM  Result Value Ref Range   Prothrombin Time 20.4 (H) 11.4 - 15.2 seconds   INR 1.7 (H) 0.8 - 1.2    Comment: (NOTE) INR goal varies based on device and disease states. Performed at [facility], [address], [place], [place] [address]   APTT     Status: Abnormal   Collection Time: 04/30/23 11:44 AM  Result Value Ref Range   aPTT 45 (H) 24 - 36 seconds    Comment:        IF BASELINE aPTT IS ELEVATED, SUGGEST PATIENT RISK ASSESSMENT BE USED TO DETERMINE APPROPRIATE ANTICOAGULANT THERAPY. Performed at [facility], [address] [place],  [place] [address]   Troponin I (High Sensitivity)     Status: Abnormal   Collection Time: 04/30/23 11:44 AM  Result Value Ref Range   Troponin I (High Sensitivity) 48 (H) <18 ng/L    Comment: (NOTE) Elevated high sensitivity troponin I (hsTnI) values and significant  changes across serial measurements may suggest ACS but many other  chronic and acute conditions are known to elevate hsTnI results.  Refer to the "Links" section for chest pain algorithms and additional  guidance. Performed at [facility], [name], [place], [place] [address]   Hepatic function panel     Status: Abnormal   Collection Time: 04/30/23 11:44 AM  Result Value Ref Range   Total Protein 7.3 6.5 - 8.1 g/dL   Albumin 3.7 3.5 - 5.0 g/dL   AST 32 15 - 41 U/L    Comment: HEMOLYSIS AT THIS LEVEL MAY AFFECT RESULT   ALT 18 0 - 44 U/L    Comment: HEMOLYSIS AT THIS LEVEL MAY AFFECT RESULT   Alkaline Phosphatase 72 38 - 126 U/L   Total Bilirubin 1.3 (H) <1.2 mg/dL    Comment: HEMOLYSIS AT THIS LEVEL MAY AFFECT RESULT   Bilirubin, Direct 0.2 0.0 - 0.2 mg/dL   Indirect Bilirubin 1.1 (H) 0.3 - 0.9 mg/dL    Comment: Performed at [facility], [address] [place], [place] [address]  I-Stat Lactic Acid, ED     Status: Abnormal   Collection Time: 04/30/23 12:51 PM  Result Value Ref Range   Lactic Acid, Venous 2.9 (HH) 0.5 - 1.9 mmol/L   Comment NOTIFIED PHYSICIAN   Troponin I (High Sensitivity)     Status: Abnormal   Collection Time: 04/30/23  1:17 PM  Result Value Ref Range   Troponin I (High Sensitivity) 36 (H) <18 ng/L    Comment: (NOTE) Elevated high sensitivity troponin I (hsTnI) values and significant  changes across serial measurements may suggest ACS but many other  chronic and acute conditions are known to elevate hsTnI results.  Refer to the "Links" section for chest pain algorithms and additional  guidance. Performed at [facility], [address], [place], [place] [address]    I-Stat Lactic Acid, ED     Status: None   Collection Time: 04/30/23  1:33 PM  Result Value Ref Range   Lactic Acid, Venous 1.7 0.5 - 1.9 mmol/L    DG Chest Port 1 View  Result Date: 04/30/2023 CLINICAL DATA:  Shortness of breath and headache EXAM: PORTABLE CHEST 1 VIEW COMPARISON:  12/26/2022 FINDINGS: Numerous leads and wires project over the chest. Remote left rib fractures. Midline trachea. Borderline cardiomegaly. Atherosclerosis in the transverse aorta. Left costophrenic angle excluded. No pleural effusion or pneumothorax. Chronic interstitial thickening/coarsening there is likely related to COPD/chronic bronchitis given clinical history of smoking. No lobar consolidation. IMPRESSION: No acute cardiopulmonary disease. Peribronchial thickening which may relate to chronic bronchitis or smoking. Aortic Atherosclerosis (ICD10-I70.0). Electronically Signed   By: Jeronimo Greaves M.D.   On: 04/30/2023 16:55   CT ABDOMEN PELVIS W CONTRAST  Result Date: 04/30/2023 CLINICAL DATA:  Right lower quadrant pain, no bowel movement since last Thursday, testicular pain EXAM: CT ABDOMEN AND PELVIS WITH CONTRAST TECHNIQUE: Multidetector CT imaging of the abdomen and pelvis was performed using the standard protocol following bolus administration of intravenous contrast. RADIATION DOSE REDUCTION: This exam was performed according to the departmental dose-optimization program which includes automated exposure control, adjustment of the mA and/or kV according to patient size and/or use of iterative reconstruction technique. CONTRAST:  60mL OMNIPAQUE IOHEXOL 350 MG/ML SOLN COMPARISON:  11/18/2020 FINDINGS: Lower chest: Emphysema. No acute pleural or parenchymal lung disease. Hepatobiliary: No focal liver abnormality is seen. No gallstones, gallbladder wall thickening, or biliary dilatation. Pancreas: Unremarkable. No pancreatic ductal dilatation or surrounding inflammatory changes. Spleen: Normal in size without focal  abnormality. Adrenals/Urinary Tract: Adrenal glands are unremarkable. Kidneys are normal, without renal calculi, focal lesion, or hydronephrosis. Bladder is unremarkable. Stomach/Bowel: There is a dilated inflamed appendix within the right lower quadrant, measuring up to 16 mm in diameter. And appendicolith is seen at the appendiceal orifice. Marked periappendiceal fat stranding with evidence of micro perforation at the base of the appendix. No fluid collection or abscess. No bowel obstruction or ileus. Vascular/Lymphatic: Subcentimeter right lower quadrant mesenteric lymph nodes are likely reactive. No pathologic adenopathy. Atherosclerosis of the aorta and its branches. Reproductive: Prostate is unremarkable. Other: Mesenteric edema and trace free fluid within the right lower quadrant. Punctate extraluminal gas adjacent to the base of the appendix consistent with micro perforation. No other evidence of pneumoperitoneum. No abdominal wall hernia. Musculoskeletal: No acute or destructive bony abnormalities. Reconstructed images demonstrate no additional findings. IMPRESSION: 1. Acute perforated appendicitis.  No fluid collection or abscess. 2.  Aortic Atherosclerosis (ICD10-I70.0). Critical Value/emergent results were called by telephone at the time of interpretation on 04/30/2023 at 4:29 pm to provider [facility], who verbally acknowledged these results. Electronically Signed   By: Sharlet Salina M.D.   On: 04/30/2023 16:32   US SCROTUM W/DOPPLER  Result Date: 04/30/2023 CLINICAL DATA:  Left testicular pain EXAM: SCROTAL ULTRASOUND DOPPLER ULTRASOUND OF THE TESTICLES TECHNIQUE: Complete ultrasound examination of the testicles, epididymis, and other scrotal structures was performed. Color and spectral Doppler ultrasound were also utilized to evaluate blood flow to the testicles. COMPARISON:  11/18/2020 FINDINGS: Right testicle Measurements: 2.5 x 4.0 x 2.2 cm. No mass or microlithiasis visualized. Left  testicle Measurements: 2.1 x 3.9 x 1.7 cm. No mass or microlithiasis visualized. Right epididymis: Incidental 3 mm epididymal cyst of doubtful clinical significance. Otherwise normal  size and appearance. Left epididymis: Incidental 2 mm epididymal cyst of doubtful clinical significance. Otherwise normal size and appearance. Hydrocele:  None visualized. Varicocele:  None visualized. Pulsed Doppler interrogation of both testes demonstrates normal low resistance arterial and venous waveforms bilaterally. IMPRESSION: 1. Unremarkable testicular ultrasound. 2. Small bilateral epididymal cysts of likely no clinical significance. Electronically Signed   By: Sharlet Salina M.D.   On: 04/30/2023 16:11   CT Head Wo Contrast  Result Date: 04/30/2023 CLINICAL DATA:  frontal HA, sepsis EXAM: CT HEAD WITHOUT CONTRAST TECHNIQUE: Contiguous axial images were obtained from the base of the skull through the vertex without intravenous contrast. RADIATION DOSE REDUCTION: This exam was performed according to the departmental dose-optimization program which includes automated exposure control, adjustment of the mA and/or kV according to patient size and/or use of iterative reconstruction technique. COMPARISON:  None Available. FINDINGS: Brain: No hemorrhage. No hydrocephalus. No extra-axial fluid collection. No CT evidence of an acute cortical infarct. No mass effect. No mass lesion. Mineralization of the basal ganglia bilaterally. Vascular: No hyperdense vessel or unexpected calcification. Skull: Normal. Negative for fracture or focal lesion. Sinuses/Orbits: No middle ear effusion. There is a trace left mastoid effusion. Paranasal sinuses are clear. Bilateral lens replacement. Orbits are otherwise unremarkable. Other: None. IMPRESSION: 1. No CT finding to explain frontal headache. 2. Trace left mastoid effusion. Electronically Signed   By: Lorenza Cambridge M.D.   On: 04/30/2023 14:44    Review of Systems  Constitutional:  Positive  for appetite change.  HENT: Negative.    Eyes: Negative.   Respiratory: Negative.    Cardiovascular: Negative.   Gastrointestinal:  Positive for abdominal pain and nausea.  Genitourinary:        Scrotal pain  Musculoskeletal: Negative.   Skin: Negative.   Allergic/Immunologic: Negative.   Neurological: Negative.   Hematological: Negative.   Psychiatric/Behavioral: Negative.     Blood pressure (!) 142/83, pulse 93, temperature 98.3 F (36.8 C), temperature source Oral, resp. rate 18, SpO2 99%. Physical Exam Eyes:     Pupils: Pupils are equal, round, and reactive to light.  Cardiovascular:     Rate and Rhythm: Normal rate. Rhythm irregular.  Pulmonary:     Effort: Pulmonary effort is normal.     Breath sounds: Normal breath sounds.  Chest:     Chest wall: No tenderness.  Abdominal:     Palpations: Abdomen is soft.     Tenderness: There is abdominal tenderness. There is no rebound.     Comments: Tender RLQ  Musculoskeletal:     Right lower leg: No edema.     Left lower leg: No edema.  Skin:    General: Skin is warm.  Neurological:     Mental Status: He is alert and oriented to person, place, and time.  Psychiatric:        Mood and Affect: Mood normal.     Assessment/Plan: Acute appendicitis with microperforation -recommend NPO except sips with meds, IV Zosyn.  Hold Xarelto.  We will follow for consideration of surgery once Xarelto wears off.  He may improve on antibiotics.  Agree with medical admission for management of multiple medical problems.  Liz Malady 04/30/2023, 5:53 PM

## 2023-04-30 NOTE — Assessment & Plan Note (Signed)
-   Ongoing use of close to 1 pack/day - Nicotine patch offered

## 2023-05-01 ENCOUNTER — Inpatient Hospital Stay (HOSPITAL_COMMUNITY): Payer: Medicare HMO

## 2023-05-01 ENCOUNTER — Encounter (HOSPITAL_COMMUNITY)
Admission: EM | Disposition: A | Discharge status: Skilled Nursing Facility | Payer: Self-pay | Source: Home / Self Care | Attending: Internal Medicine

## 2023-05-01 ENCOUNTER — Ambulatory Visit: Payer: Medicare HMO

## 2023-05-01 ENCOUNTER — Other Ambulatory Visit: Payer: Self-pay

## 2023-05-01 ENCOUNTER — Inpatient Hospital Stay (HOSPITAL_COMMUNITY): Payer: Medicare HMO | Admitting: Registered Nurse

## 2023-05-01 ENCOUNTER — Encounter (HOSPITAL_COMMUNITY): Payer: Self-pay | Admitting: Family Medicine

## 2023-05-01 ENCOUNTER — Ambulatory Visit: Payer: Medicare HMO | Admitting: Podiatry

## 2023-05-01 DIAGNOSIS — E440 Moderate protein-calorie malnutrition: Secondary | ICD-10-CM | POA: Insufficient documentation

## 2023-05-01 DIAGNOSIS — K35201 Acute appendicitis with generalized peritonitis, with perforation, without abscess: Secondary | ICD-10-CM

## 2023-05-01 DIAGNOSIS — K37 Unspecified appendicitis: Secondary | ICD-10-CM

## 2023-05-01 HISTORY — PX: LAPAROSCOPIC APPENDECTOMY: SHX408

## 2023-05-01 LAB — BASIC METABOLIC PANEL
Anion gap: 10 (ref 5–15)
BUN: 8 mg/dL (ref 8–23)
CO2: 29 mmol/L (ref 22–32)
Calcium: 8.2 mg/dL — ABNORMAL LOW (ref 8.9–10.3)
Chloride: 96 mmol/L — ABNORMAL LOW (ref 98–111)
Creatinine, Ser: 0.97 mg/dL (ref 0.61–1.24)
GFR, Estimated: >60 mL/min (ref >60)
Glucose, Bld: 98 mg/dL (ref 70–99)
Potassium: 3.3 mmol/L — ABNORMAL LOW (ref 3.5–5.1)
Sodium: 135 mmol/L (ref 135–145)

## 2023-05-01 LAB — CBC
HCT: 30.1 % — ABNORMAL LOW (ref 39.0–52.0)
Hemoglobin: 8.8 g/dL — ABNORMAL LOW (ref 13.0–17.0)
MCH: 26.2 pg (ref 26.0–34.0)
MCHC: 29.2 g/dL — ABNORMAL LOW (ref 30.0–36.0)
MCV: 89.6 fL (ref 80.0–100.0)
Platelets: 246 10*3/uL (ref 150–400)
RBC: 3.36 MIL/uL — ABNORMAL LOW (ref 4.22–5.81)
RDW: 15.6 % — ABNORMAL HIGH (ref 11.5–15.5)
WBC: 11 10*3/uL — ABNORMAL HIGH (ref 4.0–10.5)
nRBC: 0 % (ref 0.0–0.2)

## 2023-05-01 LAB — GLUCOSE, CAPILLARY: Glucose-Capillary: 115 mg/dL — ABNORMAL HIGH (ref 70–99)

## 2023-05-01 SURGERY — APPENDECTOMY, LAPAROSCOPIC
Anesthesia: General

## 2023-05-01 MED ORDER — IRON SUCROSE 200 MG IVPB - SIMPLE MED
200.0000 mg | Freq: Once | Status: DC
  Filled 2023-05-01: qty 110

## 2023-05-01 MED ORDER — PHENYLEPHRINE HCL-NACL 20-0.9 MG/250ML-% IV SOLN
INTRAVENOUS | Status: DC | PRN
  Administered 2023-05-01: 30 ug/min via INTRAVENOUS

## 2023-05-01 MED ORDER — ORAL CARE MOUTH RINSE: 15.0000 mL | Freq: Once | OROMUCOSAL | Status: AC

## 2023-05-01 MED ORDER — ACETAMINOPHEN 500 MG PO TABS
1000.0000 mg | ORAL_TABLET | Freq: Four times a day (QID) | ORAL | Status: DC
  Administered 2023-05-01 – 2023-05-16 (×46): 1000 mg via ORAL
  Filled 2023-05-01 (×52): qty 2

## 2023-05-01 MED ORDER — ENOXAPARIN SODIUM 40 MG/0.4ML IJ SOSY
40.0000 mg | PREFILLED_SYRINGE | INTRAMUSCULAR | Status: DC
  Administered 2023-05-02: 40 mg via SUBCUTANEOUS
  Filled 2023-05-01: qty 0.4

## 2023-05-01 MED ORDER — BOOST / RESOURCE BREEZE PO LIQD CUSTOM
1.0000 | Freq: Three times a day (TID) | ORAL | Status: DC
  Administered 2023-05-01 – 2023-05-05 (×8): 1 via ORAL

## 2023-05-01 MED ORDER — POTASSIUM CHLORIDE CRYS ER 20 MEQ PO TBCR
40.0000 meq | EXTENDED_RELEASE_TABLET | Freq: Once | ORAL | Status: AC
Start: 2023-05-01 — End: 2023-05-01
  Administered 2023-05-01: 40 meq via ORAL
  Filled 2023-05-01: qty 2

## 2023-05-01 MED ORDER — LIDOCAINE 2% (20 MG/ML) 5 ML SYRINGE
INTRAMUSCULAR | Status: AC
  Filled 2023-05-01: qty 5

## 2023-05-01 MED ORDER — PHENYLEPHRINE 80 MCG/ML (10ML) SYRINGE FOR IV PUSH (FOR BLOOD PRESSURE SUPPORT)
PREFILLED_SYRINGE | INTRAVENOUS | Status: DC | PRN
  Administered 2023-05-01: 80 ug via INTRAVENOUS
  Administered 2023-05-01: 160 ug via INTRAVENOUS

## 2023-05-01 MED ORDER — MORPHINE SULFATE (PF) 2 MG/ML IV SOLN
1.0000 mg | INTRAVENOUS | Status: DC | PRN
Start: 2023-05-01 — End: 2023-05-21
  Administered 2023-05-06 – 2023-05-20 (×6): 2 mg via INTRAVENOUS
  Filled 2023-05-01 (×7): qty 1

## 2023-05-01 MED ORDER — ACETAMINOPHEN 500 MG PO TABS
1000.0000 mg | ORAL_TABLET | Freq: Once | ORAL | Status: AC
  Administered 2023-05-01: 1000 mg via ORAL
  Filled 2023-05-01: qty 2

## 2023-05-01 MED ORDER — PROPOFOL 10 MG/ML IV BOLUS
INTRAVENOUS | Status: DC | PRN
  Administered 2023-05-01: 120 mg via INTRAVENOUS

## 2023-05-01 MED ORDER — LACTATED RINGERS IV SOLN: INTRAVENOUS | Status: DC

## 2023-05-01 MED ORDER — CHLORHEXIDINE GLUCONATE 0.12 % MT SOLN: 15.0000 mL | Freq: Once | OROMUCOSAL | Status: AC

## 2023-05-01 MED ORDER — MIDAZOLAM HCL 2 MG/2ML IJ SOLN
INTRAMUSCULAR | Status: AC
  Filled 2023-05-01: qty 2

## 2023-05-01 MED ORDER — LIDOCAINE 2% (20 MG/ML) 5 ML SYRINGE
INTRAMUSCULAR | Status: DC | PRN
  Administered 2023-05-01: 60 mg via INTRAVENOUS

## 2023-05-01 MED ORDER — CHLORHEXIDINE GLUCONATE 0.12 % MT SOLN
OROMUCOSAL | Status: AC
  Administered 2023-05-01: 15 mL via OROMUCOSAL
  Filled 2023-05-01: qty 15

## 2023-05-01 MED ORDER — ROCURONIUM BROMIDE 10 MG/ML (PF) SYRINGE
PREFILLED_SYRINGE | INTRAVENOUS | Status: AC
  Filled 2023-05-01: qty 10

## 2023-05-01 MED ORDER — FENTANYL CITRATE (PF) 250 MCG/5ML IJ SOLN
INTRAMUSCULAR | Status: DC | PRN
  Administered 2023-05-01 (×3): 50 ug via INTRAVENOUS

## 2023-05-01 MED ORDER — DEXAMETHASONE SODIUM PHOSPHATE 10 MG/ML IJ SOLN
INTRAMUSCULAR | Status: AC
  Filled 2023-05-01: qty 1

## 2023-05-01 MED ORDER — PHENYLEPHRINE 80 MCG/ML (10ML) SYRINGE FOR IV PUSH (FOR BLOOD PRESSURE SUPPORT)
PREFILLED_SYRINGE | INTRAVENOUS | Status: AC
  Filled 2023-05-01: qty 10

## 2023-05-01 MED ORDER — ROCURONIUM BROMIDE 10 MG/ML (PF) SYRINGE
PREFILLED_SYRINGE | INTRAVENOUS | Status: DC | PRN
  Administered 2023-05-01: 10 mg via INTRAVENOUS
  Administered 2023-05-01: 50 mg via INTRAVENOUS

## 2023-05-01 MED ORDER — BUPIVACAINE-EPINEPHRINE (PF) 0.25% -1:200000 IJ SOLN
INTRAMUSCULAR | Status: AC
  Filled 2023-05-01: qty 30

## 2023-05-01 MED ORDER — ONDANSETRON HCL 4 MG/2ML IJ SOLN
INTRAMUSCULAR | Status: DC | PRN
  Administered 2023-05-01: 4 mg via INTRAVENOUS

## 2023-05-01 MED ORDER — BUPIVACAINE-EPINEPHRINE 0.25% -1:200000 IJ SOLN
INTRAMUSCULAR | Status: DC | PRN
  Administered 2023-05-01: 5 mL

## 2023-05-01 MED ORDER — DEXMEDETOMIDINE HCL IN NACL 80 MCG/20ML IV SOLN
INTRAVENOUS | Status: AC
  Filled 2023-05-01: qty 20

## 2023-05-01 MED ORDER — FENTANYL CITRATE (PF) 250 MCG/5ML IJ SOLN
INTRAMUSCULAR | Status: AC
  Filled 2023-05-01: qty 5

## 2023-05-01 MED ORDER — ONDANSETRON HCL 4 MG/2ML IJ SOLN
INTRAMUSCULAR | Status: AC
  Filled 2023-05-01: qty 2

## 2023-05-01 MED ORDER — MIDAZOLAM HCL 2 MG/2ML IJ SOLN
INTRAMUSCULAR | Status: DC | PRN
  Administered 2023-05-01: 1 mg via INTRAVENOUS

## 2023-05-01 MED ORDER — PROPOFOL 10 MG/ML IV BOLUS
INTRAVENOUS | Status: AC
  Filled 2023-05-01: qty 20

## 2023-05-01 MED ORDER — ADULT MULTIVITAMIN W/MINERALS CH
1.0000 | ORAL_TABLET | Freq: Every day | ORAL | Status: DC
  Administered 2023-05-02 – 2023-05-21 (×20): 1 via ORAL
  Filled 2023-05-01 (×20): qty 1

## 2023-05-01 MED ORDER — SUGAMMADEX SODIUM 200 MG/2ML IV SOLN
INTRAVENOUS | Status: DC | PRN
  Administered 2023-05-01: 300 mg via INTRAVENOUS
  Administered 2023-05-01: 100 mg via INTRAVENOUS

## 2023-05-01 MED ORDER — FENTANYL CITRATE (PF) 100 MCG/2ML IJ SOLN: 25.0000 ug | INTRAMUSCULAR | Status: DC | PRN

## 2023-05-01 MED ORDER — SODIUM CHLORIDE 0.9 % IV SOLN
200.0000 mg | Freq: Once | INTRAVENOUS | Status: AC
  Administered 2023-05-01: 200 mg via INTRAVENOUS
  Filled 2023-05-01: qty 10

## 2023-05-01 MED ORDER — OXYCODONE HCL 5 MG PO TABS
5.0000 mg | ORAL_TABLET | ORAL | Status: DC | PRN
  Administered 2023-05-01 (×2): 10 mg via ORAL
  Administered 2023-05-02: 5 mg via ORAL
  Administered 2023-05-02 – 2023-05-10 (×5): 10 mg via ORAL
  Administered 2023-05-10: 5 mg via ORAL
  Administered 2023-05-11 – 2023-05-12 (×5): 10 mg via ORAL
  Administered 2023-05-12: 5 mg via ORAL
  Administered 2023-05-13 – 2023-05-14 (×4): 10 mg via ORAL
  Administered 2023-05-16: 5 mg via ORAL
  Administered 2023-05-17 – 2023-05-18 (×2): 10 mg via ORAL
  Filled 2023-05-01 (×4): qty 2
  Filled 2023-05-01: qty 1
  Filled 2023-05-01 (×3): qty 2
  Filled 2023-05-01: qty 1
  Filled 2023-05-01 (×14): qty 2
  Filled 2023-05-01 (×2): qty 1

## 2023-05-01 SURGICAL SUPPLY — 44 items
APPLIER CLIP 5 13 M/L LIGAMAX5 (MISCELLANEOUS) IMPLANT
BAG COUNTER SPONGE SURGICOUNT (BAG) ×1 IMPLANT
CANISTER SUCT 3000ML PPV (MISCELLANEOUS) ×1 IMPLANT
CHLORAPREP W/TINT 26 (MISCELLANEOUS) ×1 IMPLANT
CLIP APPLIE 5 13 M/L LIGAMAX5 (MISCELLANEOUS) IMPLANT
COVER SURGICAL LIGHT HANDLE (MISCELLANEOUS) ×1 IMPLANT
CUTTER FLEX LINEAR 45M (STAPLE) ×1 IMPLANT
DERMABOND ADVANCED .7 DNX12 (GAUZE/BANDAGES/DRESSINGS) ×1 IMPLANT
DERMABOND ADVANCED .7 DNX6 (GAUZE/BANDAGES/DRESSINGS) IMPLANT
DRAIN CHANNEL 19F RND (DRAIN) IMPLANT
ELECT REM PT RETURN 9FT ADLT (ELECTROSURGICAL) ×1 IMPLANT
ELECTRODE REM PT RTRN 9FT ADLT (ELECTROSURGICAL) ×1 IMPLANT
GLOVE BIO SURGEON STRL SZ7 (GLOVE) ×1 IMPLANT
GLOVE BIOGEL PI IND STRL 7.5 (GLOVE) ×1 IMPLANT
GOWN STRL REUS W/ TWL LRG LVL3 (GOWN DISPOSABLE) ×3 IMPLANT
GRASPER SUT TROCAR 14GX15 (MISCELLANEOUS) ×1 IMPLANT
IRRIG SUCT STRYKERFLOW 2 WTIP (MISCELLANEOUS) ×1 IMPLANT
IRRIGATION SUCT STRKRFLW 2 WTP (MISCELLANEOUS) ×1 IMPLANT
KIT BASIN OR (CUSTOM PROCEDURE TRAY) ×1 IMPLANT
KIT TURNOVER KIT B (KITS) ×1 IMPLANT
NS IRRIG 1000ML POUR BTL (IV SOLUTION) ×1 IMPLANT
PAD ARMBOARD 7.5X6 YLW CONV (MISCELLANEOUS) ×2 IMPLANT
POUCH RETRIEVAL ECOSAC 10 (ENDOMECHANICALS) ×1 IMPLANT
RELOAD 45 VASCULAR/THIN (ENDOMECHANICALS) IMPLANT
RELOAD STAPLE 45 2.5 WHT GRN (ENDOMECHANICALS) IMPLANT
RELOAD STAPLE 45 3.5 BLU ETS (ENDOMECHANICALS) IMPLANT
RELOAD STAPLE TA45 3.5 REG BLU (ENDOMECHANICALS) ×1 IMPLANT
SCISSORS LAP 5X35 DISP (ENDOMECHANICALS) IMPLANT
SET TUBE SMOKE EVAC HIGH FLOW (TUBING) ×1 IMPLANT
SHEARS HARMONIC ACE PLUS 36CM (ENDOMECHANICALS) ×1 IMPLANT
SLEEVE Z-THREAD 5X100MM (TROCAR) ×1 IMPLANT
SPECIMEN JAR SMALL (MISCELLANEOUS) ×1 IMPLANT
STRIP CLOSURE SKIN 1/2X4 (GAUZE/BANDAGES/DRESSINGS) ×1 IMPLANT
SUT ETHILON 2 0 FS 18 (SUTURE) IMPLANT
SUT MNCRL AB 4-0 PS2 18 (SUTURE) ×1 IMPLANT
SUT VICRYL 0 UR6 27IN ABS (SUTURE) ×1 IMPLANT
TOWEL GREEN STERILE (TOWEL DISPOSABLE) ×1 IMPLANT
TOWEL GREEN STERILE FF (TOWEL DISPOSABLE) ×1 IMPLANT
TRAY FOLEY MTR SLVR 16FR STAT (SET/KITS/TRAYS/PACK) IMPLANT
TRAY LAPAROSCOPIC MC (CUSTOM PROCEDURE TRAY) ×1 IMPLANT
TROCAR BALLN 12MMX100 BLUNT (TROCAR) ×1 IMPLANT
TROCAR Z-THREAD OPTICAL 5X100M (TROCAR) ×1 IMPLANT
WARMER LAPAROSCOPE (MISCELLANEOUS) ×1 IMPLANT
WATER STERILE IRR 1000ML POUR (IV SOLUTION) ×1 IMPLANT

## 2023-05-01 NOTE — Anesthesia Preprocedure Evaluation (Addendum)
Anesthesia Evaluation  Patient identified by MRN, date of birth, ID band Patient awake    Reviewed: Allergy & Precautions, NPO status , Patient's Chart, lab work & pertinent test results  Airway Mallampati: II  TM Distance: >3 FB Neck ROM: Full    Dental  (+) Poor Dentition, Missing   Pulmonary COPD, Current Smoker   Pulmonary exam normal        Cardiovascular hypertension, Pt. on medications and Pt. on home beta blockers + CAD, + Peripheral Vascular Disease and +CHF   Rhythm:Regular Rate:Normal    1. Left ventricular ejection fraction, by estimation, is 45 to 50%. The  left ventricle has mildly decreased function. The left ventricle  demonstrates global hypokinesis. There is mild concentric left ventricular  hypertrophy. Left ventricular diastolic  parameters are consistent with Grade I diastolic dysfunction (impaired  relaxation).   2. Right ventricular systolic function is normal. The right ventricular  size is normal.   3. The mitral valve is normal in structure. No evidence of mitral valve  regurgitation. No evidence of mitral stenosis.   4. The aortic valve is normal in structure. Aortic valve regurgitation is  not visualized. Aortic valve sclerosis/calcification is present, without  any evidence of aortic stenosis.   5. The inferior vena cava is normal in size with greater than 50%  respiratory variability, suggesting right atrial pressure of 3 mmHg.     Neuro/Psych  Headaches  negative psych ROS   GI/Hepatic Neg liver ROS, PUD,,,Acute appendicitis    Endo/Other  diabetes    Renal/GU   negative genitourinary   Musculoskeletal   Abdominal Normal abdominal exam  (+)   Peds  Hematology  (+) Blood dyscrasia, anemia Lab Results      Component                Value               Date                      WBC                      11.0 (H)            05/01/2023                HGB                      8.8 (L)              05/01/2023                HCT                      30.1 (L)            05/01/2023                MCV                      89.6                05/01/2023                PLT                      246  05/01/2023              Anesthesia Other Findings   Reproductive/Obstetrics                             Anesthesia Physical Anesthesia Plan  ASA: 3  Anesthesia Plan: General   Post-op Pain Management: Gabapentin PO (pre-op)* and Tylenol PO (pre-op)*   Induction: Intravenous  PONV Risk Score and Plan: 1 and Ondansetron, Dexamethasone and Treatment may vary due to age or medical condition  Airway Management Planned: Mask and Oral ETT  Additional Equipment: None  Intra-op Plan:   Post-operative Plan: Extubation in OR  Informed Consent: I have reviewed the patients History and Physical, chart, labs and discussed the procedure including the risks, benefits and alternatives for the proposed anesthesia with the patient or authorized representative who has indicated his/her understanding and acceptance.     Dental advisory given  Plan Discussed with: CRNA  Anesthesia Plan Comments:        Anesthesia Quick Evaluation

## 2023-05-01 NOTE — Plan of Care (Signed)

## 2023-05-01 NOTE — Op Note (Signed)
Preoperative diagnosis: Acute perforated appendicitis Postoperative diagnosis: Perforated appendicitis Procedure: Laparoscopic appendectomy Surgeon: Dr. Harden Mo Anesthesia: General Estimated blood loss: Less than 50 cc Complications: None Drains: 19 French Blake drain to right lower quadrant and pelvis Specimens: Appendix to pathology Sponge needle count was correct at completion Disposition recovery stable condition   Indications: 67yo M with past medical history of CAD, COPD (2L home O2), CHF, DM, HTN, and CVA on Xarelto presented to the emergency department with 1 week history of lower abdominal pain which radiates over to his right lower quadrant.  He underwent workup in the emergency department and was found to have acute kidney injury and signs of possible sepsis.  CT scan of the abdomen pelvis demonstrated acute appendicitis with microperforation. I discussed proceeding with lap appendectomy today as he was ill from this.    Procedure: After informed consent was obtained the patient was taken to the operating room.  He was given antibiotics.  SCDs were in place.  He was placed under general anesthesia without complication.  He was prepped and draped in standard sterile surgical fashion.  A surgical timeout was then performed.   I infiltrated marcaine below the umbilicus.  I made an incision and carried this to the fascia. I incised the fascia and entered the peritoneum bluntly.  I placed a 0 vicryl pursestring suture and inserted a hasson trocar.  I insufflated the abdomen to 15 mm Hg pressure. I inserted two additional 5 mm ports in the lower abdomen and eventually one in the upper abdomen.  I then went to his right lower quadrant.  His appendix was not really visible.  His terminal ileum was stuck down to the sidewall and I used a combination of sharp dissection and harmonic scalpel to rotate this medially.  I then took down a portion of the white line to rotate the colon medially.   It became clear that his appendix had perforated.  There was an abscess that I drained.  The appendix was in several pieces.  There were also several free-floating fecaliths present.  I was able to remove these pieces of the appendix as well as the fecaliths.  I placed him in a retrieval bag.  I then was able to identify the base.  This is perforated about a centimeter from the base.  I was clearly able to identify the terminal ileum and the cecum.  I was able to place a stapler at the base of the cecum on healthy tissue and divided this.  The small portion of the appendix was also removed.  I then removed all the contents of the retrieval bag.  I then irrigated copiously.  There was nothing that was bleeding present.  I removed all the irrigant that I could.  I did place a 46 [place] Blake drain that exited via the left lower quadrant.  I secured this with a 2-0 nylon suture.  I then removed the [facility] trocar and tied my pursestring down.  I placed an additional 0 Vicryl suture x 2 to completely obliterate that defect.  The abdomen was then desufflated and the trocars removed.  These were all closed with 4-0 Monocryl and glue.  He tolerated this well was transferred recovery stable.

## 2023-05-01 NOTE — Transfer of Care (Signed)
Immediate Anesthesia Transfer of Care Note  Patient: Jared [name]  Procedure(s) Performed: APPENDECTOMY LAPAROSCOPIC  Patient Location: PACU  Anesthesia Type:General  Level of Consciousness: drowsy and patient cooperative  Airway & Oxygen Therapy: Patient connected to face mask oxygen  Post-op Assessment: Report given to RN and Post -op Vital signs reviewed and stable  Post vital signs: Reviewed and stable  Last Vitals:  Vitals Value Taken Time  BP 107/68 05/01/23 1530  Temp    Pulse 95 05/01/23 1532  Resp 18 05/01/23 1532  SpO2 100 % 05/01/23 1532  Vitals shown include unfiled device data.  Last Pain:  Vitals:   05/01/23 1346  TempSrc:   PainSc: 0-No pain      Patients Stated Pain Goal: 3 (05/01/23 1025)  Complications: There were no known notable events for this encounter.

## 2023-05-01 NOTE — Hospital Course (Addendum)
[id number] with h/o chronic combined CHF, HTN, CVA, afib on Xarelto, PVD, and COPD on 2L home O2 who presented on 11/19 with SOB and other complaints.  Fever 100.9, WBC 13.4, lactate 2.9, Creatinine 1.65 with AKI.  CT A?P with acute perforated appendicitis with microperforation.  Admitted for IV antibiotics, Xarelto washout.  Surgery is consulting.  Underwent lap appy on 11/20 but slow recovery.  Repeat CT with abscess and concern for peritonitis.

## 2023-05-01 NOTE — Progress Notes (Addendum)
Initial Nutrition Assessment  DOCUMENTATION CODES:   Non-severe (moderate) malnutrition in context of acute illness/injury  INTERVENTION:  - Provide MVI with minerals daily  - Diet advancement per surgery   - Recommend replenishing potassium, current level 3.3   NUTRITION DIAGNOSIS:   Moderate Malnutrition related to acute illness as evidenced by energy intake < 75% for > 7 days, mild muscle depletion, mild fat depletion.   GOAL:   Patient will meet greater than or equal to 90% of their needs   MONITOR:   PO intake, Diet advancement, Labs, Weight trends  REASON FOR ASSESSMENT:   Malnutrition Screening Tool    ASSESSMENT:  67 y.o. male with PMH of CHF, T2DM, HTN, CVA, atrial flutter, CAD, AVM, PVD and COPD with chronic hypoxic respiratory failure on 2L who presents with numerous complaints including headache, abdominal, testicular pain. Found to have acute perforated appendicitis.  11/19: CT showed acute perforated appendicitis with microperforation  11/20: Appendectomy Laparoscopic   Pt undergoing appendectomy today, has been NPO since admission.   Pt reports having abdominal pain in the last year. During this time pt endorses good app and PO intake, eating 2 meals/day and snacking on fruit or chips throughout the day. He typically would have eggs, sausage, and grits for breakfast and a protein with a grain and veggies for dinner. He typically does not have lunch and maybe has a piece of fruit instead.   Pt started having increasing abdominal pain 1 week ago and since then has had very minimal intake. He states only he has been eating fruit and canned spaghettis with a hot dog everyday leading up to admission. He also reports not having a bowel movement since 11/14.   Per documented weight history pt seems weight stable besides this acute illness. Pt states UBW to be around 220 lbs. Pt does not like Ensures but is willing to try Boost Breeze/ Mighty shake depending on diet  advancement after surgery.   Admit weight: 99.3 kg  Current weight: 99.3 kg    05/01/23 99.3 kg  04/24/23 99.7 kg  04/17/23 99.1 kg  03/21/23 99.3 kg  02/27/23 98 kg  02/20/23 97.8 kg  02/13/23 98.2 kg  02/06/23 98 kg  02/04/23 98.1 kg  01/28/23 98 kg    Average Meal Intake: NPO  Nutritionally Relevant Medications: Scheduled Meds:  [MAR Hold] ezetimibe  10 mg Oral Daily   [MAR Hold] ferrous sulfate  325 mg Oral Q breakfast   [MAR Hold] folic acid  1 mg Oral Daily   [MAR Hold] nicotine  21 mg Transdermal Daily   [MAR Hold] pantoprazole  40 mg Oral Daily   Continuous Infusions:  lactated ringers     [MAR Hold] piperacillin-tazobactam (ZOSYN)  IV 3.375 g (05/01/23 0519)   Labs Reviewed: Potassium 3.3, Calcium 8.2,  CBG ranges from 90-129 mg/dL over the last 24 hours  NUTRITION - FOCUSED PHYSICAL EXAM:  Flowsheet Row Most Recent Value  Orbital Region Mild depletion  Upper Arm Region Mild depletion  Thoracic and Lumbar Region Unable to assess  Buccal Region Mild depletion  Temple Region Severe depletion  Clavicle Bone Region Mild depletion  Clavicle and Acromion Bone Region Mild depletion  Scapular Bone Region Mild depletion  Dorsal Hand Mild depletion  Patellar Region Moderate depletion  Anterior Thigh Region Moderate depletion  Posterior Calf Region Unable to assess  Edema (RD Assessment) None  Hair Reviewed  Eyes Reviewed  Mouth Reviewed  Skin Reviewed  Nails Reviewed  Diet Order:   Diet Order             Diet NPO time specified Except for: Sips with Meds  Diet effective now                   EDUCATION NEEDS:   Education needs have been addressed  Skin:  Skin Assessment: Reviewed RN Assessment  Last BM:  11/14  Height:   Ht Readings from Last 1 Encounters:  05/01/23 6\' 3"  (1.905 m)    Weight:   Wt Readings from Last 1 Encounters:  05/01/23 99.3 kg    Ideal Body Weight:  89.1 kg  BMI:  Body mass index is 27.37  kg/m.  Estimated Nutritional Needs:   Kcal:  2400-2600 kcal  Protein:  130-150 gm  Fluid:  >2.4L  Elliot Dally, RD Registered Dietitian  See Amion for more information

## 2023-05-01 NOTE — Discharge Instructions (Addendum)
[facility]  Please arrive at least 30 min before your appointment to complete your check in paperwork.  If you are unable to arrive 30 min prior to your appointment time we may have to cancel or reschedule you. LAPAROSCOPIC SURGERY: POST OP INSTRUCTIONS Always review your discharge instruction sheet given to you by the facility where your surgery was performed. IF YOU HAVE DISABILITY OR FAMILY LEAVE FORMS, YOU MUST BRING THEM TO THE OFFICE FOR PROCESSING.   DO NOT GIVE THEM TO YOUR DOCTOR.  PAIN CONTROL  First take acetaminophen (Tylenol) AND/or ibuprofen (Advil) to control your pain after surgery.  Follow directions on package.  Taking acetaminophen (Tylenol) and/or ibuprofen (Advil) regularly after surgery will help to control your pain and lower the amount of prescription pain medication you may need.  You should not take more than 4,000 mg (4 grams) of acetaminophen (Tylenol) in 24 hours.  You should not take ibuprofen (Advil), aleve, motrin, naprosyn or other NSAIDS if you have a history of stomach ulcers or chronic kidney disease.  A prescription for pain medication may be given to you upon discharge.  Take your pain medication as prescribed, if you still have uncontrolled pain after taking acetaminophen (Tylenol) or ibuprofen (Advil). Use ice packs to help control pain. If you need a refill on your pain medication, please contact your pharmacy.  They will contact our office to request authorization. Prescriptions will not be filled after 5pm or on week-ends.  HOME MEDICATIONS Take your usually prescribed medications unless otherwise directed.  DIET You should follow a light diet the first few days after arrival home.  Be sure to include lots of fluids daily. Avoid fatty, fried foods.   CONSTIPATION It is common to experience some constipation after surgery and if you are taking pain medication.  Increasing fluid intake and taking a stool softener (such as Colace)  will usually help or prevent this problem from occurring.  A mild laxative (Milk of Magnesia or Miralax) should be taken according to package instructions if there are no bowel movements after 48 hours.  WOUND/INCISION CARE Most patients will experience some swelling and bruising in the area of the incisions.  Ice packs will help.  Swelling and bruising can take several days to resolve.  Unless discharge instructions indicate otherwise, follow guidelines below  STERI-STRIPS - you may remove your outer bandages 48 hours after surgery, and you may shower at that time.  You have steri-strips (small skin tapes) in place directly over the incision.  These strips should be left on the skin for 7-10 days.   DERMABOND/SKIN GLUE - you may shower in 24 hours.  The glue will flake off over the next 2-3 weeks. Any sutures or staples will be removed at the office during your follow-up visit.  ACTIVITIES You may resume regular (light) daily activities beginning the next day--such as daily self-care, walking, climbing stairs--gradually increasing activities as tolerated.  You may have sexual intercourse when it is comfortable.  Refrain from any heavy lifting or straining until approved by your doctor. You may drive when you are no longer taking prescription pain medication, you can comfortably wear a seatbelt, and you can safely maneuver your car and apply brakes.  FOLLOW-UP You should see your doctor in the office for a follow-up appointment approximately 2-3 weeks after your surgery.  You should have been given your post-op/follow-up appointment when your surgery was scheduled.  If you did not receive a post-op/follow-up appointment, make sure  that you call for this appointment within a day or two after you arrive home to insure a convenient appointment time.   WHEN TO CALL YOUR DOCTOR: Fever over 101.0 Inability to urinate Continued bleeding from incision. Increased pain, redness, or drainage from the  incision. Increasing abdominal pain  The clinic staff is available to answer your questions during regular business hours.  Please don't hesitate to call and ask to speak to one of the nurses for clinical concerns.  If you have a medical emergency, go to the nearest emergency room or call 911.  A surgeon from [facility] is always on call at the hospital. [address], [place], [place]  [address] ? [address] [address], [place], [place]   [address] [phone] ? [phone] ? FAX [phone]  Information on my medicine - XARELTO (Rivaroxaban)  This medication education was reviewed with me or my healthcare representative as part of my discharge preparation.    Why was Xarelto prescribed for you? Xarelto was prescribed for you to reduce the risk of a blood clot forming that can cause a stroke if you have a medical condition called atrial fibrillation (a type of irregular heartbeat).  What do you need to know about xarelto ? Take your Xarelto ONCE DAILY at the same time every day with your evening meal. If you have difficulty swallowing the tablet whole, you may crush it and mix in applesauce just prior to taking your dose.  Take Xarelto exactly as prescribed by your doctor and DO NOT stop taking Xarelto without talking to the doctor who prescribed the medication.  Stopping without other stroke prevention medication to take the place of Xarelto may increase your risk of developing a clot that causes a stroke.  Refill your prescription before you run out.  After discharge, you should have regular check-up appointments with your healthcare provider that is prescribing your Xarelto.  In the future your dose may need to be changed if your kidney function or weight changes by a significant amount.  What do you do if you miss a dose? If you are taking Xarelto ONCE DAILY and you miss a dose, take it as soon as you remember on the same day then continue your  regularly scheduled once daily regimen the next day. Do not take two doses of Xarelto at the same time or on the same day.   Important Safety Information A possible side effect of Xarelto is bleeding. You should call your healthcare provider right away if you experience any of the following: Bleeding from an injury or your nose that does not stop. Unusual colored urine (red or dark brown) or unusual colored stools (red or black). Unusual bruising for unknown reasons. A serious fall or if you hit your head (even if there is no bleeding).  Some medicines may interact with Xarelto and might increase your risk of bleeding while on Xarelto. To help avoid this, consult your healthcare provider or pharmacist prior to using any new prescription or non-prescription medications, including herbals, vitamins, non-steroidal anti-inflammatory drugs (NSAIDs) and supplements.  This website has more information on Xarelto: VisitDestination.com.br.

## 2023-05-01 NOTE — Anesthesia Procedure Notes (Signed)
Procedure Name: Intubation Date/Time: 05/01/2023 2:25 PM  Performed by: Sharyn Dross, CRNAPre-anesthesia Checklist: Patient identified, Emergency Drugs available, Suction available and Patient being monitored Patient Re-evaluated:Patient Re-evaluated prior to induction Oxygen Delivery Method: Circle system utilized Preoxygenation: Pre-oxygenation with 100% oxygen Induction Type: IV induction Ventilation: Mask ventilation without difficulty Laryngoscope Size: Mac and 4 Grade View: Grade II Tube type: Oral Tube size: 7.5 mm Number of attempts: 1 Airway Equipment and Method: Stylet and Oral airway Placement Confirmation: ETT inserted through vocal cords under direct vision, positive ETCO2 and breath sounds checked- equal and bilateral Secured at: 23 cm Tube secured with: Tape Dental Injury: Teeth and Oropharynx as per pre-operative assessment

## 2023-05-01 NOTE — Plan of Care (Signed)

## 2023-05-01 NOTE — Progress Notes (Signed)
Progress Note   Patient: Jared [name] ZOX:[id number] DOB: 23-Mar-1956 DOA: 04/30/2023     1 DOS: the patient was seen and examined on 05/01/2023   Brief hospital course: 67yo with h/o chronic combined CHF, HTN, CVA, afib on Xarelto, PVD, and COPD on 2L home O2 who presented on 11/19 with SOB and other complaints.  Fever 100.9, WBC 13.4, lactate 2.9, Creatinine 1.65 with AKI.  CT A?P with acute perforated appendicitis with microperforation.  Admitted for IV antibiotics, Xarelto washout.  Surgery is consulting.  Assessment and Plan:  Sepsis due to acute perforated appendicitis with microperforation Surgery has consulted and recommended holding Xarelto and keeping n.p.o. except for sips with meds Plan for surgery today Continue IV Zosyn Blood cultures pending Surgery will assume care post-operatively   AKI (acute kidney injury) (HCC) Prerenal AKI with Elevated creatinine of 1.69 from prior of 1 Resolved with IVF   Syncope and collapse Patient reports 2 years of intermittent generalized body shaking with syncope and loss of consciousness that are often positional when going from sitting to standing. Last echocardiogram was 12/2021 with EF of 45 to 50% and global hypokinesis, grade 1 diastolic dysfunction.  No significant valvular abnormality. Check UDS, TSH Obtain repeat echo and keep on telemetry  IDA Patient with h/o iron deficiency anemia Receives weekly IV iron infusions, due today Hgb 8.8 on presentation, down from 10/6 on 11/11 Will give IV iron dose here as per patient request   Chronic respiratory failure with hypoxia  Secondary to COPD on 2L Stable although with wheezing on exam at time of admission (none this AM) PRN duoneb Continue Breztri   Essential hypertension Continue hydralazine, Imdur, metoprolol Holding Lasix due to AKI on admission (resolved)  HLD Continue Zetia, atorvastatin   Atrial flutter  Continue Toprol XL Holding Xarelto per surgery - last dose was  PM 11/18.   History of CVA (cerebrovascular accident) Continue aspirin Xarelto currently on hold pending need for surgical intervention   Peripheral vascular disease (HCC) Continue aspirin, statin  Depression Continue Cymbalta, gabapentin   Tobacco use Ongoing use of close to 1 pack/day Nicotine patch offered    Consultants: Surgery  Procedures: Appendectomy 11/20  Antibiotics: Zosyn 11/19-  30 Day Unplanned Readmission Risk Score    Flowsheet Row ED to Hosp-Admission (Current) from 04/30/2023 in [facility]  SURGICAL  30 Day Unplanned Readmission Risk Score (%) 19.1 Filed at 05/01/2023 0801       This score is the patient's risk of an unplanned readmission within 30 days of being discharged (0 -100%). The score is based on dignosis, age, lab data, medications, orders, and past utilization.   Low:  0-14.9   Medium: 15-21.9   High: 22-29.9   Extreme: 30 and above           Subjective: Patient with ongoing RLQ pain, eager to have surgery.   Objective: Vitals:   05/01/23 1650 05/01/23 1650  BP: 98/61 98/61  Pulse: 86 85  Resp: 16 16  Temp: 97.8 F (36.6 C) 97.8 F (36.6 C)  SpO2: 98% 98%    Intake/Output Summary (Last 24 hours) at 05/01/2023 1752 Last data filed at 05/01/2023 1508 Gross per 24 hour  Intake 700 ml  Output 850 ml  Net -150 ml   Filed Weights   04/30/23 2314 05/01/23 1334  Weight: 99.4 kg 99.3 kg    Exam:  General:  Appears calm and comfortable and is in NAD Eyes:  EOMI, normal lids,  iris ENT:  grossly normal hearing, lips & tongue, mmm; poor/absent dentition Neck:  no LAD, masses or thyromegaly Cardiovascular:  RRR, no m/r/g. No LE edema.  Respiratory:   CTA bilaterally with no wheezes/rales/rhonchi.  Normal respiratory effort. Abdomen:  RLQ TTP, +rebound Skin:  no rash or induration seen on limited exam Musculoskeletal:  grossly normal tone BUE/BLE, good ROM, no bony abnormality Psychiatric:   blunted mood and affect, speech fluent and appropriate, AOx3 Neurologic:  CN 2-12 grossly intact, moves all extremities in coordinated fashion  Data Reviewed: I have reviewed the patient's lab results since admission.  Pertinent labs for today include:   K+ 3.3 WBC 11 Hgb 8.8 Lactate 2.9 -> 1.7    Family Communication: None present; surgery team will communicate post-operatively with his sister  Disposition: Status is: Inpatient Remains inpatient appropriate because: surgery today     Time spent: 50 minutes  Unresulted Labs (From admission, onward)     Start     Ordered   05/02/23 0500  CBC  Tomorrow morning,   R        05/01/23 1652   05/02/23 0500  Comprehensive metabolic panel  Tomorrow morning,   R        05/01/23 1652             Author: Jonah Blue, MD 05/01/2023 5:52 PM  For on call review [email or web address].

## 2023-05-01 NOTE — Progress Notes (Signed)
Progress Note     Subjective: Has had abdominal pain for over a week and limited food intake secondary to this. Since admission pain comes and goes and is 9/10 at worst. Pain medications help  He last took xarelto 1800 11/18 Objective: Vital signs in last 24 hours: Temp:  [98.3 F (36.8 C)-100.9 F (38.3 C)] 99.3 F (37.4 C) (11/20 0733) Pulse Rate:  [93-130] 110 (11/20 0733) Resp:  [16-26] 16 (11/20 0733) BP: (104-161)/(54-87) 119/63 (11/20 0733) SpO2:  [91 %-100 %] 99 % (11/20 0733) FiO2 (%):  [32 %] 32 % (11/20 0816) Weight:  [99.4 kg] 99.4 kg (11/19 2314) Last BM Date : 04/25/23 (per pt report)  Intake/Output from previous day: 11/19 0701 - 11/20 0700 In: -  Out: 700 [Urine:700] Intake/Output this shift: No intake/output data recorded.  PE: General: pleasant, WD, male who is laying in bed in NAD HEENT: head is normocephalic, atraumatic.  Sclera are noninjected.  Pupils equal and round. EOMs intact.  Ears and nose without any masses or lesions.  Mouth is pink and moist Lungs: Respiratory effort nonlabored on 3 lpm supp O2 Abd: soft, ND. Moderate TTP across lower abdomen greatest in RLQ with guarding MSK: all 4 extremities are symmetrical with no cyanosis, clubbing, or edema. Skin: warm and dry Psych: A&Ox3 with an appropriate affect.    Lab Results:  Recent Labs    04/30/23 1027 05/01/23 0749  WBC 13.4* 11.0*  HGB 10.1* 8.8*  HCT 34.6* 30.1*  PLT 306 246   BMET Recent Labs    04/30/23 1027  NA 135  K 3.6  CL 98  CO2 27  GLUCOSE 129*  BUN 12  CREATININE 1.65*  CALCIUM 8.5*   PT/INR Recent Labs    04/30/23 1144  LABPROT 20.4*  INR 1.7*   CMP     Component Value Date/Time   NA 135 04/30/2023 1027   NA 142 10/25/2022 1027   K 3.6 04/30/2023 1027   CL 98 04/30/2023 1027   CO2 27 04/30/2023 1027   GLUCOSE 129 (H) 04/30/2023 1027   BUN 12 04/30/2023 1027   BUN 14 10/25/2022 1027   CREATININE 1.65 (H) 04/30/2023 1027   CREATININE 1.20  07/30/2016 1431   CALCIUM 8.5 (L) 04/30/2023 1027   PROT 7.3 04/30/2023 1144   PROT 6.7 10/25/2022 1027   ALBUMIN 3.7 04/30/2023 1144   ALBUMIN 4.4 10/25/2022 1027   AST 32 04/30/2023 1144   ALT 18 04/30/2023 1144   ALKPHOS 72 04/30/2023 1144   BILITOT 1.3 (H) 04/30/2023 1144   BILITOT 0.3 10/25/2022 1027   GFRNONAA 45 (L) 04/30/2023 1027   GFRNONAA 64 07/30/2016 1431   GFRAA 91 01/29/2020 1020   GFRAA 74 07/30/2016 1431   Lipase     Component Value Date/Time   LIPASE 41 09/20/2021 1620       Studies/Results: DG Chest Port 1 View  Result Date: 04/30/2023 CLINICAL DATA:  Shortness of breath and headache EXAM: PORTABLE CHEST 1 VIEW COMPARISON:  12/26/2022 FINDINGS: Numerous leads and wires project over the chest. Remote left rib fractures. Midline trachea. Borderline cardiomegaly. Atherosclerosis in the transverse aorta. Left costophrenic angle excluded. No pleural effusion or pneumothorax. Chronic interstitial thickening/coarsening there is likely related to COPD/chronic bronchitis given clinical history of smoking. No lobar consolidation. IMPRESSION: No acute cardiopulmonary disease. Peribronchial thickening which may relate to chronic bronchitis or smoking. Aortic Atherosclerosis (ICD10-I70.0). Electronically Signed   By: Jeronimo Greaves M.D.   On: 04/30/2023 16:55  CT ABDOMEN PELVIS W CONTRAST  Result Date: 04/30/2023 CLINICAL DATA:  Right lower quadrant pain, no bowel movement since last Thursday, testicular pain EXAM: CT ABDOMEN AND PELVIS WITH CONTRAST TECHNIQUE: Multidetector CT imaging of the abdomen and pelvis was performed using the standard protocol following bolus administration of intravenous contrast. RADIATION DOSE REDUCTION: This exam was performed according to the departmental dose-optimization program which includes automated exposure control, adjustment of the mA and/or kV according to patient size and/or use of iterative reconstruction technique. CONTRAST:  60mL  OMNIPAQUE IOHEXOL 350 MG/ML SOLN COMPARISON:  11/18/2020 FINDINGS: Lower chest: Emphysema. No acute pleural or parenchymal lung disease. Hepatobiliary: No focal liver abnormality is seen. No gallstones, gallbladder wall thickening, or biliary dilatation. Pancreas: Unremarkable. No pancreatic ductal dilatation or surrounding inflammatory changes. Spleen: Normal in size without focal abnormality. Adrenals/Urinary Tract: Adrenal glands are unremarkable. Kidneys are normal, without renal calculi, focal lesion, or hydronephrosis. Bladder is unremarkable. Stomach/Bowel: There is a dilated inflamed appendix within the right lower quadrant, measuring up to 16 mm in diameter. And appendicolith is seen at the appendiceal orifice. Marked periappendiceal fat stranding with evidence of micro perforation at the base of the appendix. No fluid collection or abscess. No bowel obstruction or ileus. Vascular/Lymphatic: Subcentimeter right lower quadrant mesenteric lymph nodes are likely reactive. No pathologic adenopathy. Atherosclerosis of the aorta and its branches. Reproductive: Prostate is unremarkable. Other: Mesenteric edema and trace free fluid within the right lower quadrant. Punctate extraluminal gas adjacent to the base of the appendix consistent with micro perforation. No other evidence of pneumoperitoneum. No abdominal wall hernia. Musculoskeletal: No acute or destructive bony abnormalities. Reconstructed images demonstrate no additional findings. IMPRESSION: 1. Acute perforated appendicitis.  No fluid collection or abscess. 2.  Aortic Atherosclerosis (ICD10-I70.0). Critical Value/emergent results were called by telephone at the time of interpretation on 04/30/2023 at 4:29 pm to provider [facility], who verbally acknowledged these results. Electronically Signed   By: Sharlet Salina M.D.   On: 04/30/2023 16:32   US SCROTUM W/DOPPLER  Result Date: 04/30/2023 CLINICAL DATA:  Left testicular pain EXAM: SCROTAL  ULTRASOUND DOPPLER ULTRASOUND OF THE TESTICLES TECHNIQUE: Complete ultrasound examination of the testicles, epididymis, and other scrotal structures was performed. Color and spectral Doppler ultrasound were also utilized to evaluate blood flow to the testicles. COMPARISON:  11/18/2020 FINDINGS: Right testicle Measurements: 2.5 x 4.0 x 2.2 cm. No mass or microlithiasis visualized. Left testicle Measurements: 2.1 x 3.9 x 1.7 cm. No mass or microlithiasis visualized. Right epididymis: Incidental 3 mm epididymal cyst of doubtful clinical significance. Otherwise normal size and appearance. Left epididymis: Incidental 2 mm epididymal cyst of doubtful clinical significance. Otherwise normal size and appearance. Hydrocele:  None visualized. Varicocele:  None visualized. Pulsed Doppler interrogation of both testes demonstrates normal low resistance arterial and venous waveforms bilaterally. IMPRESSION: 1. Unremarkable testicular ultrasound. 2. Small bilateral epididymal cysts of likely no clinical significance. Electronically Signed   By: Sharlet Salina M.D.   On: 04/30/2023 16:11   CT Head Wo Contrast  Result Date: 04/30/2023 CLINICAL DATA:  frontal HA, sepsis EXAM: CT HEAD WITHOUT CONTRAST TECHNIQUE: Contiguous axial images were obtained from the base of the skull through the vertex without intravenous contrast. RADIATION DOSE REDUCTION: This exam was performed according to the departmental dose-optimization program which includes automated exposure control, adjustment of the mA and/or kV according to patient size and/or use of iterative reconstruction technique. COMPARISON:  None Available. FINDINGS: Brain: No hemorrhage. No hydrocephalus. No extra-axial fluid collection. No CT evidence  of an acute cortical infarct. No mass effect. No mass lesion. Mineralization of the basal ganglia bilaterally. Vascular: No hyperdense vessel or unexpected calcification. Skull: Normal. Negative for fracture or focal lesion.  Sinuses/Orbits: No middle ear effusion. There is a trace left mastoid effusion. Paranasal sinuses are clear. Bilateral lens replacement. Orbits are otherwise unremarkable. Other: None. IMPRESSION: 1. No CT finding to explain frontal headache. 2. Trace left mastoid effusion. Electronically Signed   By: Lorenza Cambridge M.D.   On: 04/30/2023 14:44    Anti-infectives: Anti-infectives (From admission, onward)    Start     Dose/Rate Route Frequency Ordered Stop   04/30/23 1930  piperacillin-tazobactam (ZOSYN) IVPB 3.375 g        3.375 g 12.5 mL/hr over 240 Minutes Intravenous Every 8 hours 04/30/23 1915     04/30/23 1145  piperacillin-tazobactam (ZOSYN) IVPB 3.375 g        3.375 g 100 mL/hr over 30 Minutes Intravenous  Once 04/30/23 1131 04/30/23 1242        Assessment/Plan Acute appendicitis with microperforation   - WBC improved to 11 on zosyn - hgb down to 8.8 but question some initial hemoconcentration given resolution of aki on IVF - plan for laparoscopic appendectomy today  I have discussed the procedure and risks of appendectomy. The risks include but are not limited to bleeding, infection, wound problems, anesthesia, injury to intra-abdominal organs, possibility of postoperative ileus. We discussed the possibility of a drain post operatively. He seems to understand and agrees with the plan.   FEN: NPO ID: [email or web address] VTE: hold xarelto. SCDs.   Per primary Syncope - echo pending today per primary COPD/Chronic resp failure - on 2 lpm at baseline HTN Atrial flutter - xarelto H/o CVA PVD AKI Tobacco use  I reviewed last 24 h vitals and pain scores, last 48 h intake and output, last 24 h labs and trends, and last 24 h imaging results.    LOS: 1 day   Eric Form, [facility] 05/01/2023, 8:58 AM Please see Amion for pager number during day hours 7:00am-4:30pm

## 2023-05-02 ENCOUNTER — Inpatient Hospital Stay (HOSPITAL_COMMUNITY): Payer: Medicare HMO

## 2023-05-02 ENCOUNTER — Encounter (HOSPITAL_COMMUNITY): Payer: Self-pay | Admitting: General Surgery

## 2023-05-02 DIAGNOSIS — R931 Abnormal findings on diagnostic imaging of heart and coronary circulation: Secondary | ICD-10-CM

## 2023-05-02 DIAGNOSIS — R55 Syncope and collapse: Secondary | ICD-10-CM

## 2023-05-02 DIAGNOSIS — I5030 Unspecified diastolic (congestive) heart failure: Secondary | ICD-10-CM

## 2023-05-02 DIAGNOSIS — K3532 Acute appendicitis with perforation and localized peritonitis, without abscess: Secondary | ICD-10-CM | POA: Diagnosis not present

## 2023-05-02 DIAGNOSIS — R251 Tremor, unspecified: Secondary | ICD-10-CM

## 2023-05-02 DIAGNOSIS — I82220 Acute embolism and thrombosis of inferior vena cava: Secondary | ICD-10-CM

## 2023-05-02 DIAGNOSIS — J449 Chronic obstructive pulmonary disease, unspecified: Secondary | ICD-10-CM

## 2023-05-02 DIAGNOSIS — K35201 Acute appendicitis with generalized peritonitis, with perforation, without abscess: Secondary | ICD-10-CM | POA: Diagnosis not present

## 2023-05-02 DIAGNOSIS — I480 Paroxysmal atrial fibrillation: Secondary | ICD-10-CM

## 2023-05-02 LAB — GLUCOSE, CAPILLARY
Glucose-Capillary: 227 mg/dL — ABNORMAL HIGH (ref 70–99)
Glucose-Capillary: 181 mg/dL — ABNORMAL HIGH (ref 70–99)
Glucose-Capillary: 159 mg/dL — ABNORMAL HIGH (ref 70–99)
Glucose-Capillary: 153 mg/dL — ABNORMAL HIGH (ref 70–99)
Glucose-Capillary: 181 mg/dL — ABNORMAL HIGH (ref 70–99)

## 2023-05-02 LAB — ECHOCARDIOGRAM COMPLETE
Area-P 1/2: 3.39 cm2
Height: 75 in
S' Lateral: 3.8 cm
Single Plane A4C EF: 41.6 %
Weight: 3504 [oz_av]

## 2023-05-02 LAB — COMPREHENSIVE METABOLIC PANEL
ALT: 14 U/L (ref 0–44)
AST: 21 U/L (ref 15–41)
Albumin: 2.7 g/dL — ABNORMAL LOW (ref 3.5–5.0)
Alkaline Phosphatase: 57 U/L (ref 38–126)
Anion gap: 9 (ref 5–15)
BUN: 14 mg/dL (ref 8–23)
CO2: 30 mmol/L (ref 22–32)
Calcium: 8.4 mg/dL — ABNORMAL LOW (ref 8.9–10.3)
Chloride: 96 mmol/L — ABNORMAL LOW (ref 98–111)
Creatinine, Ser: 1.61 mg/dL — ABNORMAL HIGH (ref 0.61–1.24)
GFR, Estimated: 46 mL/min — ABNORMAL LOW (ref >60)
Glucose, Bld: 193 mg/dL — ABNORMAL HIGH (ref 70–99)
Potassium: 4.3 mmol/L (ref 3.5–5.1)
Sodium: 135 mmol/L (ref 135–145)
Total Bilirubin: 0.4 mg/dL (ref <1.2)
Total Protein: 6 g/dL — ABNORMAL LOW (ref 6.5–8.1)

## 2023-05-02 LAB — LACTIC ACID, PLASMA: Lactic Acid, Venous: 5.3 mmol/L (ref 0.5–1.9)

## 2023-05-02 LAB — CBC
HCT: 33.2 % — ABNORMAL LOW (ref 39.0–52.0)
Hemoglobin: 9.6 g/dL — ABNORMAL LOW (ref 13.0–17.0)
MCH: 26.3 pg (ref 26.0–34.0)
MCHC: 28.9 g/dL — ABNORMAL LOW (ref 30.0–36.0)
MCV: 91 fL (ref 80.0–100.0)
Platelets: 355 10*3/uL (ref 150–400)
RBC: 3.65 MIL/uL — ABNORMAL LOW (ref 4.22–5.81)
RDW: 15.9 % — ABNORMAL HIGH (ref 11.5–15.5)
WBC: 10.2 10*3/uL (ref 4.0–10.5)
nRBC: 0 % (ref 0.0–0.2)

## 2023-05-02 LAB — TROPONIN I (HIGH SENSITIVITY)
Troponin I (High Sensitivity): 37 ng/L — ABNORMAL HIGH (ref <18)
Troponin I (High Sensitivity): 40 ng/L — ABNORMAL HIGH (ref <18)

## 2023-05-02 LAB — BRAIN NATRIURETIC PEPTIDE: B Natriuretic Peptide: 261.2 pg/mL — ABNORMAL HIGH (ref 0.0–100.0)

## 2023-05-02 LAB — HEPARIN LEVEL (UNFRACTIONATED): Heparin Unfractionated: 0.65 [IU]/mL (ref 0.30–0.70)

## 2023-05-02 MED ORDER — ARFORMOTEROL TARTRATE 15 MCG/2ML IN NEBU
15.0000 ug | INHALATION_SOLUTION | Freq: Two times a day (BID) | RESPIRATORY_TRACT | Status: DC
  Administered 2023-05-02 – 2023-05-21 (×36): 15 ug via RESPIRATORY_TRACT
  Filled 2023-05-02 (×38): qty 2

## 2023-05-02 MED ORDER — BISACODYL 10 MG RE SUPP
10.0000 mg | Freq: Every day | RECTAL | Status: DC | PRN
Start: 2023-05-02 — End: 2023-05-08
  Filled 2023-05-02 (×2): qty 1

## 2023-05-02 MED ORDER — REVEFENACIN 175 MCG/3ML IN SOLN
175.0000 ug | Freq: Every day | RESPIRATORY_TRACT | Status: DC
  Administered 2023-05-02 – 2023-05-21 (×19): 175 ug via RESPIRATORY_TRACT
  Filled 2023-05-02 (×20): qty 3

## 2023-05-02 MED ORDER — HEPARIN (PORCINE) 25000 UT/250ML-% IV SOLN
1900.0000 [IU]/h | INTRAVENOUS | Status: AC
  Administered 2023-05-02 – 2023-05-04 (×3): 1600 [IU]/h via INTRAVENOUS
  Administered 2023-05-04: 1700 [IU]/h via INTRAVENOUS
  Administered 2023-05-05: 1900 [IU]/h via INTRAVENOUS
  Filled 2023-05-02 (×5): qty 250

## 2023-05-02 MED ORDER — BUDESONIDE 0.5 MG/2ML IN SUSP
0.5000 mg | Freq: Two times a day (BID) | RESPIRATORY_TRACT | Status: DC
  Administered 2023-05-02 – 2023-05-21 (×36): 0.5 mg via RESPIRATORY_TRACT
  Filled 2023-05-02 (×38): qty 2

## 2023-05-02 MED ORDER — TECHNETIUM TO 99M ALBUMIN AGGREGATED
4.0000 | Freq: Once | INTRAVENOUS | Status: AC | PRN
  Administered 2023-05-02: 4 via INTRAVENOUS

## 2023-05-02 MED ORDER — PERFLUTREN LIPID MICROSPHERE
1.0000 mL | INTRAVENOUS | Status: AC | PRN
  Administered 2023-05-02: 5 mL via INTRAVENOUS

## 2023-05-02 MED ORDER — LACTATED RINGERS IV SOLN: INTRAVENOUS | Status: DC

## 2023-05-02 MED ORDER — DEXTROSE-SODIUM CHLORIDE 5-0.9 % IV SOLN: INTRAVENOUS | Status: DC

## 2023-05-02 MED ORDER — LACTATED RINGERS IV BOLUS
500.0000 mL | Freq: Once | INTRAVENOUS | Status: AC
  Administered 2023-05-02: 500 mL via INTRAVENOUS

## 2023-05-02 MED ORDER — ALBUTEROL SULFATE (2.5 MG/3ML) 0.083% IN NEBU
2.5000 mg | INHALATION_SOLUTION | RESPIRATORY_TRACT | Status: DC | PRN
  Administered 2023-05-05 – 2023-05-06 (×4): 2.5 mg via RESPIRATORY_TRACT
  Filled 2023-05-02 (×5): qty 3

## 2023-05-02 NOTE — Progress Notes (Signed)
EEG complete - results pending [facility] charge captured

## 2023-05-02 NOTE — Progress Notes (Signed)
   05/02/23 1807  Assess: MEWS Score  Temp 98.2 F (36.8 C)  BP 105/67  MAP (mmHg) 80  Pulse Rate (!) 130  Resp 18  Level of Consciousness Alert  SpO2 95 %  O2 Device Nasal Cannula  O2 Flow Rate (L/min) 2 L/min  Assess: MEWS Score  MEWS Temp 0  MEWS Systolic 0  MEWS Pulse 3  MEWS RR 0  MEWS LOC 0  MEWS Score 3  MEWS Score Color Yellow  Assess: if the MEWS score is Yellow or Red  Were vital signs accurate and taken at a resting state? Yes  Does the patient meet 2 or more of the SIRS criteria? No  Does the patient have a confirmed or suspected source of infection? No  MEWS guidelines implemented  Yes, yellow  Treat  MEWS Interventions Considered administering scheduled or prn medications/treatments as ordered  Take Vital Signs  Increase Vital Sign Frequency  Yellow: Q2hr x1, continue Q4hrs until patient remains green for 12hrs  Escalate  MEWS: Escalate Yellow: Discuss with charge nurse and consider notifying provider and/or RRT  Notify: Charge Nurse/RN  Name of Charge Nurse/RN Notified Catrina RN  Provider Notification  Provider Name/Title Dr. Laurey Arrow  Date Provider Notified 05/02/23  Time Provider Notified 1825  Method of Notification Page  Notification Reason Critical Result  Provider response See new orders  Date of Provider Response 05/02/23  Time of Provider Response 1825  Notify: Rapid Response  Name of Rapid Response RN Notified Nicki RN  Date Rapid Response Notified 05/02/23  Time Rapid Response Notified 1827  Assess: SIRS CRITERIA  SIRS Temperature  0  SIRS Pulse 1  SIRS Respirations  0  SIRS WBC 0  SIRS Score Sum  1   1840: Pt HR- 112 at this time, since given beta blocker.

## 2023-05-02 NOTE — Progress Notes (Signed)
Lab called this nurse with critical lactic acid elevated at 5.3. Secure chat Dr. Ophelia Charter, see [facility] for new orders. Rapid response has been call to just look at the patient. Catrina RN, charge RN has been notified as well. Pt is resting in bed comfortable, some expiratory wheezing, telemetry box on patient, oxygen at 2L. Pt is arousalable when you call his name.

## 2023-05-02 NOTE — Plan of Care (Signed)
  Problem: Education: Goal: Knowledge of General Education information will improve Description: Including pain rating scale, medication(s)/side effects and non-pharmacologic comfort measures Outcome: Progressing   Problem: Clinical Measurements: Goal: Will remain free from infection Outcome: Progressing   Problem: Clinical Measurements: Goal: Respiratory complications will improve Outcome: Progressing   Problem: Activity: Goal: Risk for activity intolerance will decrease Outcome: Progressing   Problem: Nutrition: Goal: Adequate nutrition will be maintained Outcome: Progressing   Problem: Elimination: Goal: Will not experience complications related to urinary retention Outcome: Progressing   Problem: Safety: Goal: Ability to remain free from injury will improve Outcome: Progressing

## 2023-05-02 NOTE — Progress Notes (Signed)
Pharmacy Antibiotic Note  Siddiq Maharrey is a 67 y.o. male admitted on 04/30/2023 presenting with appendicitis with microperforation. Pt is now s/p appendectomy with drain placement on 11/20.  Pharmacy has been consulted for zosyn dosing. Per surgery, plan for 5 days abx post-operatively.   Plan: Continue Zosyn 3.375g IV q 8h (extended 4h infusion) Monitor renal function, surgery plans and LOT  Height: 6\' 3"  (190.5 cm) Weight: 99.3 kg (219 lb) IBW/kg (Calculated) : 84.5  Temp (24hrs), Avg:98.1 F (36.7 C), Min:97.8 F (36.6 C), Max:98.9 F (37.2 C)  Recent Labs  Lab 04/30/23 1027 04/30/23 1251 04/30/23 1333 05/01/23 0749 05/02/23 0755  WBC 13.4*  --   --  11.0* 10.2  CREATININE 1.65*  --   --  0.97 1.61*  LATICACIDVEN  --  2.9* 1.7  --   --     Estimated Creatinine Clearance: 51.8 mL/min (A) (by C-G formula based on SCr of 1.61 mg/dL (H)).    Allergies  Allergen Reactions   Lisinopril Cough    Calton Dach, PharmD, BCCCP Clinical Pharmacist 05/02/2023 9:58 AM

## 2023-05-02 NOTE — Plan of Care (Signed)
  Problem: Clinical Measurements: Goal: Will remain free from infection Outcome: Progressing Goal: Cardiovascular complication will be avoided Outcome: Progressing   Problem: Activity: Goal: Risk for activity intolerance will decrease Outcome: Progressing   Problem: Coping: Goal: Level of anxiety will decrease Outcome: Progressing   Problem: Safety: Goal: Ability to remain free from injury will improve Outcome: Progressing   Problem: Skin Integrity: Goal: Risk for impaired skin integrity will decrease Outcome: Progressing

## 2023-05-02 NOTE — Progress Notes (Signed)
Echocardiogram 2D Echocardiogram has been performed.  Warren Lacy Jabaree Mercado RDCS 05/02/2023, 11:51 AM

## 2023-05-02 NOTE — Progress Notes (Signed)
PHARMACY - ANTICOAGULATION CONSULT NOTE  Pharmacy Consult for heparin  Indication: pulmonary embolus, suspected  Allergies  Allergen Reactions   Lisinopril Cough    Patient Measurements: Height: 6\' 3"  (190.5 cm) Weight: 99.3 kg (219 lb) IBW/kg (Calculated) : 84.5 Heparin Dosing Weight: 99.3 kg   Vital Signs: Temp: 98.3 F (36.8 C) (11/21 1144) Temp Source: Oral (11/21 1144) BP: 108/75 (11/21 1144) Pulse Rate: 100 (11/21 1144)  Labs: Recent Labs    04/30/23 1027 04/30/23 1144 04/30/23 1317 05/01/23 0749 05/02/23 0755  HGB 10.1*  --   --  8.8* 9.6*  HCT 34.6*  --   --  30.1* 33.2*  PLT 306  --   --  246 355  APTT  --  45*  --   --   --   LABPROT  --  20.4*  --   --   --   INR  --  1.7*  --   --   --   CREATININE 1.65*  --   --  0.97 1.61*  TROPONINIHS  --  48* 36*  --   --     Estimated Creatinine Clearance: 51.8 mL/min (A) (by C-G formula based on SCr of 1.61 mg/dL (H)).   Medical History: Past Medical History:  Diagnosis Date   AVM (arteriovenous malformation)    CAD (coronary artery disease)    a. LHC 5/12:  LAD 20, pLCx 20, pRCA 40, dRCA 40, EF 35%, diff HK  //  b. Myoview 4/16: Overall Impression:  High risk stress nuclear study There is no evidence of ischemia.  There is severe LV dysfunction. LV Ejection Fraction: 30%.  LV Wall Motion:  There is global LV hypokinesis.     CAP (community acquired pneumonia) 09/2013   Chronic combined systolic and diastolic CHF (congestive heart failure) (HCC)    a. Echo 4/16:Mild LVH, EF 40-45%, diffuse HK //  b. Echo 8/17: EF 35-40%, diffuse HK, diastolic dysfunction, aortic sclerosis, trivial MR, moderate LAE, normal RVSF, moderate RAE, mild TR, PASP 42 mmHg // c. Echo 4/18: Mild concentric LVH, EF 30-35, normal wall motion, grade 1 diastolic dysfunction, PASP 49   Chronic respiratory failure (HCC)    Cluster headache    "hx; haven't had one in awhile" (01/09/2016)   COPD (chronic obstructive pulmonary disease) (HCC)     [name] 01/09/2016   DM (diabetes mellitus) (HCC)    History of CVA (cerebrovascular accident)    Hypertension    IDA (iron deficiency anemia)    Moderate tobacco use disorder    NICM (nonischemic cardiomyopathy) (HCC)    Nicotine addiction    Tobacco abuse    Type 2 diabetes mellitus (HCC) 05/14/2016    Assessment: 67 yo M with suspected PE from ECHO findings. V/Q scan pending. Pt is s/p appendectomy with drain placement on 11/20.   Hgb 9.6, Plt 355 Enoxaparin 40mg  SQ given this AM: 11/21 @ 0927   Goal of Therapy:  Heparin level 0.3-0.7 units/ml Monitor platelets by anticoagulation protocol: Yes   Plan:  D/c enoxaparin SQ (DVT prophylaxis) No bolus per MD given recent surgery Initiate heparin infusion at 1600 units/hr 6hr HL at 1900 Daily HL, CBC F/u s/sx bleeding, Hgb trend    Calton Dach, PharmD, BCCCP Clinical Pharmacist 05/02/2023 1:13 PM

## 2023-05-02 NOTE — Plan of Care (Signed)
  Problem: Clinical Measurements: Goal: Will remain free from infection Outcome: Progressing Goal: Cardiovascular complication will be avoided Outcome: Progressing   Problem: Coping: Goal: Level of anxiety will decrease Outcome: Progressing

## 2023-05-02 NOTE — Anesthesia Postprocedure Evaluation (Signed)
Anesthesia Post Note  Patient: Hotel manager  Procedure(s) Performed: APPENDECTOMY LAPAROSCOPIC     Patient location during evaluation: PACU Anesthesia Type: General Level of consciousness: awake and alert Pain management: pain level controlled Vital Signs Assessment: post-procedure vital signs reviewed and stable Respiratory status: spontaneous breathing, nonlabored ventilation, respiratory function stable and patient connected to nasal cannula oxygen Cardiovascular status: blood pressure returned to baseline and stable Postop Assessment: no apparent nausea or vomiting Anesthetic complications: no   There were no known notable events for this encounter.  Last Vitals:  Vitals:   05/02/23 1807 05/02/23 1810  BP: 105/67   Pulse: (!) 130 (!) 130  Resp: 18   Temp: 36.8 C   SpO2: 95%     Last Pain:  Vitals:   05/02/23 1812  TempSrc:   PainSc: 3                  Hibah Odonnell P Kaedance Magos

## 2023-05-02 NOTE — Consult Note (Signed)
Cardiology Consultation   Patient ID: Jared [name] MRN: [id number]; DOB: [birth year]  Admit date: 04/30/2023 Date of Consult: 05/02/2023  PCP:  Jared Register, Jared [name]   [facility] Providers Cardiologist:  Dietrich Pates, Jared [name]   {\ Patient Profile:   Jared [name] is a 67 y.o. male with a hx of CAD, HFrEF who is being seen 05/02/2023 for the evaluation of abnormal echo with RV dysfunction  at the request of .hospitalist  History of Present Illness:   Jared [name] is a 67 yo with hx of mild CAD (cath in 2012; normal myoview in 2016), HFrEF (LVEF 30 to 35% in 2018; 45 to 50% in 2019), HTN, PAD (s/p stent to L Fempop in June 2024),HL, paroxysmal atrial flutter (on Xarelto), CVA, COPD (on O2)     I saw the pt in clinic in Oct 2024 The pt was admitted on 04/30/23 with lower abdominal pain, decreased apptetite  He had not had a bowel movement for approximately 1 week    Pt also reported intermit syncope with body shaking for 2 years, dizziness  with standing.  (He never mentioned at cardiology appts) In ER T 100.9   Cr 1.65  Lactate 2.9   Trop mildly increased at 36    CT showed acute perforated appendicitis PT had syncopal spell while in Xray when he stood up   General surgery contacted   REcomm IV abx, allow Xarelto to wash out   11/20 went on to have exp lap with appy     Echo today showed LVEF normal  RV was dilated with depressed function   ? Thrombus in IVC USN and CT scheduled   Cardiology and CCM contacted   PT placed empirically on heparin   V/Q scan was negative    Abdominal USN nondiagnostic     Past Medical History:  Diagnosis Date   AVM (arteriovenous malformation)    CAD (coronary artery disease)    a. LHC 5/12:  LAD 20, pLCx 20, pRCA 40, dRCA 40, EF 35%, diff HK  //  b. Myoview 4/16: Overall Impression:  High risk stress nuclear study There is no evidence of ischemia.  There is severe LV dysfunction. LV Ejection Fraction: 30%.  LV Wall Motion:  There is global LV  hypokinesis.     CAP (community acquired pneumonia) 09/2013   Chronic combined systolic and diastolic CHF (congestive heart failure) (HCC)    a. Echo 4/16:Mild LVH, EF 40-45%, diffuse HK //  b. Echo 8/17: EF 35-40%, diffuse HK, diastolic dysfunction, aortic sclerosis, trivial MR, moderate LAE, normal RVSF, moderate RAE, mild TR, PASP 42 mmHg // c. Echo 4/18: Mild concentric LVH, EF 30-35, normal wall motion, grade 1 diastolic dysfunction, PASP 49   Chronic respiratory failure (HCC)    Cluster headache    "hx; haven't had one in awhile" (01/09/2016)   COPD (chronic obstructive pulmonary disease) (HCC)    Jared [name] 01/09/2016   DM (diabetes mellitus) (HCC)    History of CVA (cerebrovascular accident)    Hypertension    IDA (iron deficiency anemia)    Moderate tobacco use disorder    NICM (nonischemic cardiomyopathy) (HCC)    Nicotine addiction    Tobacco abuse    Type 2 diabetes mellitus (HCC) 05/14/2016    Past Surgical History:  Procedure Laterality Date   ABDOMINAL AORTOGRAM W/LOWER EXTREMITY N/A 12/07/2022   Procedure: ABDOMINAL AORTOGRAM W/LOWER EXTREMITY;  Surgeon: Leonie Douglas, Jared [name];  [facility]: [facility];  Service: Cardiovascular;  Laterality: N/A;   BIOPSY  11/12/2020   Procedure: BIOPSY;  Surgeon: Lemar Lofty., Jared [name];  Location: [facility] ENDOSCOPY;  Service: Gastroenterology;;   CARDIAC CATHETERIZATION  10/2010   LM normal, LAD with 20% irregularities, LCX with 20%, RCA with 40% prox and 40% distal - EF of 35%   CATARACT EXTRACTION, BILATERAL     COLONOSCOPY W/ BIOPSIES AND POLYPECTOMY     COLONOSCOPY WITH PROPOFOL N/A 09/06/2018   Procedure: COLONOSCOPY WITH PROPOFOL;  Surgeon: Tressia Danas, Jared [name];  Location: [facility] ENDOSCOPY;  Service: Gastroenterology;  Laterality: N/A;   ENTEROSCOPY N/A 09/28/2018   Procedure: ENTEROSCOPY;  Surgeon: Jeani Hawking, Jared [name];  Location: [facility] ENDOSCOPY;  Service: Endoscopy;  Laterality: N/A;   ENTEROSCOPY N/A 10/28/2018   Procedure: ENTEROSCOPY;   Surgeon: Tressia Danas, Jared [name];  Location: [facility] ENDOSCOPY;  Service: Gastroenterology;  Laterality: N/A;   ENTEROSCOPY N/A 10/09/2020   Procedure: ENTEROSCOPY;  Surgeon: Hilarie Fredrickson, Jared [name];  Location: [facility] ENDOSCOPY;  Service: Endoscopy;  Laterality: N/A;   ENTEROSCOPY N/A 03/22/2022   Procedure: ENTEROSCOPY;  Surgeon: Benancio Deeds, Jared [name];  Location: [facility] ENDOSCOPY;  Service: Gastroenterology;  Laterality: N/A;   ESOPHAGOGASTRODUODENOSCOPY N/A 11/12/2020   Procedure: ESOPHAGOGASTRODUODENOSCOPY (EGD);  Surgeon: Lemar Lofty., Jared [name];  Location: [facility] ENDOSCOPY;  Service: Gastroenterology;  Laterality: N/A;   ESOPHAGOGASTRODUODENOSCOPY (EGD) WITH PROPOFOL N/A 09/05/2018   Procedure: ESOPHAGOGASTRODUODENOSCOPY (EGD) WITH PROPOFOL;  Surgeon: Benancio Deeds, Jared [name];  Location: [facility] ENDOSCOPY;  Service: Gastroenterology;  Laterality: N/A;   ESOPHAGOGASTRODUODENOSCOPY (EGD) WITH PROPOFOL N/A 11/19/2020   Procedure: ESOPHAGOGASTRODUODENOSCOPY (EGD) WITH PROPOFOL;  Surgeon: Beverley Fiedler, Jared [name];  Location: [facility] ENDOSCOPY;  Service: Gastroenterology;  Laterality: N/A;   ESOPHAGOGASTRODUODENOSCOPY (EGD) WITH PROPOFOL N/A 12/27/2022   Procedure: ESOPHAGOGASTRODUODENOSCOPY (EGD) WITH PROPOFOL;  Surgeon: Napoleon Form, Jared [name];  Location: [facility] ENDOSCOPY;  Service: Gastroenterology;  Laterality: N/A;   EXCISION MASS HEAD     GIVENS CAPSULE STUDY N/A 09/06/2018   Procedure: GIVENS CAPSULE STUDY;  Surgeon: Tressia Danas, Jared [name];  Location: [facility] ENDOSCOPY;  Service: Gastroenterology;  Laterality: N/A;   GIVENS CAPSULE STUDY N/A 09/26/2018   Procedure: GIVENS CAPSULE STUDY;  Surgeon: Beverley Fiedler, Jared [name];  Location: [facility] ENDOSCOPY;  Service: Gastroenterology;  Laterality: N/A;   GIVENS CAPSULE STUDY N/A 06/14/2019   Procedure: GIVENS CAPSULE STUDY;  Surgeon: Napoleon Form, Jared [name];  Location: [facility] ENDOSCOPY;  Service: Endoscopy;  Laterality: N/A;   HEMOSTASIS CLIP PLACEMENT  11/12/2020   Procedure: HEMOSTASIS CLIP PLACEMENT;  Surgeon:  Lemar Lofty., Jared [name];  Location: [facility] ENDOSCOPY;  Service: Gastroenterology;;   HEMOSTASIS CONTROL  11/12/2020   Procedure: HEMOSTASIS CONTROL;  Surgeon: Lemar Lofty., Jared [name];  Location: [facility] ENDOSCOPY;  Service: Gastroenterology;;   HOT HEMOSTASIS N/A 10/28/2018   Procedure: HOT HEMOSTASIS (ARGON PLASMA COAGULATION/BICAP);  Surgeon: Tressia Danas, Jared [name];  Location: [facility] ENDOSCOPY;  Service: Gastroenterology;  Laterality: N/A;   HOT HEMOSTASIS N/A 11/12/2020   Procedure: HOT HEMOSTASIS (ARGON PLASMA COAGULATION/BICAP);  Surgeon: Lemar Lofty., Jared [name];  Location: [facility] ENDOSCOPY;  Service: Gastroenterology;  Laterality: N/A;   HOT HEMOSTASIS N/A 03/22/2022   Procedure: HOT HEMOSTASIS (ARGON PLASMA COAGULATION/BICAP);  Surgeon: Benancio Deeds, Jared [name];  Location: [facility] ENDOSCOPY;  Service: Gastroenterology;  Laterality: N/A;   INCISION AND DRAINAGE PERIRECTAL ABSCESS N/A 06/05/2017   Procedure: IRRIGATION AND DEBRIDEMENT PERIRECTAL ABSCESS;  Surgeon: Andria Meuse, Jared [name];  Location: [facility] OR;  Service: General;  Laterality: N/A;   LAPAROSCOPIC APPENDECTOMY N/A 05/01/2023   Procedure: APPENDECTOMY LAPAROSCOPIC;  Surgeon: Emelia Loron, Jared [name];  Location: [facility] OR;  Service: General;  Laterality: N/A;   PERIPHERAL VASCULAR INTERVENTION  12/07/2022   Procedure: PERIPHERAL VASCULAR INTERVENTION;  Surgeon: Leonie Douglas, Jared [name];  [facility]: [facility] CV LAB;  Service: Cardiovascular;;   SUBMUCOSAL TATTOO INJECTION  11/12/2020   Procedure: SUBMUCOSAL TATTOO INJECTION;  Surgeon: Lemar Lofty., Jared [name];  Location: [facility] ENDOSCOPY;  Service: Gastroenterology;;   VIDEO BRONCHOSCOPY Bilateral 05/08/2016   Procedure: VIDEO BRONCHOSCOPY WITH FLUORO;  Surgeon: Oretha Milch, Jared [name];  Location: [facility] ENDOSCOPY;  Service: Cardiopulmonary;  Laterality: Bilateral;     Home Medications:  Prior to Admission medications   Medication Sig Start Date End Date Taking? Authorizing Provider  acetaminophen (TYLENOL) 500 MG  tablet Take 2 tablets (1,000 mg total) by mouth every 8 (eight) hours as needed for moderate pain. 01/03/23  Yes Newlin, Enobong, Jared [name]  albuterol (VENTOLIN HFA) 108 (90 Base) MCG/ACT inhaler INHALE 2 PUFFS INTO THE LUNGS EVERY 6 (SIX) HOURS AS NEEDED FOR WHEEZING OR SHORTNESS OF BREATH. Patient taking differently: Inhale 2 puffs into the lungs daily as needed for wheezing or shortness of breath. 12/06/22  Yes Jared Register, Jared [name]  aspirin EC 81 MG tablet Take 1 tablet (81 mg total) by mouth daily. Swallow whole. 12/07/22  Yes Leonie Douglas, Jared [name]  atorvastatin (LIPITOR) 80 MG tablet Take 1 tablet (80 mg total) by mouth daily. 02/04/23  Yes Jared Register, Jared [name]  DULoxetine (CYMBALTA) 60 MG capsule Take 1 capsule (60 mg total) by mouth daily. For chronic leg pains 01/09/23  Yes Jared Register, Jared [name]  ergocalciferol (DRISDOL) 1.25 MG (50000 UT) capsule Take 1 capsule (50,000 Units total) by mouth once a week. 02/26/23  Yes Jared Register, Jared [name]  ezetimibe (ZETIA) 10 MG tablet Take 1 tablet (10 mg total) by mouth daily. 01/31/23  Yes Pricilla Riffle, Jared [name]  ferrous sulfate (FEROSUL) 325 (65 FE) MG tablet Take 1 tablet (325 mg total) by mouth daily with breakfast. 01/30/23  Yes Danis, Andreas Blower, Jared [name]  folic acid (FOLVITE) 1 MG tablet Take 1 tablet (1 mg total) by mouth daily. 01/30/23  Yes Jared Register, Jared [name]  furosemide (LASIX) 40 MG tablet Take 1-2 tablets (40-80 mg total) by mouth 2 (two) times daily as needed.Take an extra pill with worsening pedal edema. 01/09/23  Yes Jared Register, Jared [name]  gabapentin (NEURONTIN) 300 MG capsule Take 1 capsule (300 mg total) by mouth 2 (two) times daily. 02/25/23  Yes Kathryne Hitch, Jared [name]  hydrALAZINE (APRESOLINE) 50 MG tablet Take 1 tablet (50 mg total) by mouth every 8 (eight) hours. 02/04/23  Yes Jared Register, Jared [name]  isosorbide mononitrate (IMDUR) 30 MG 24 hr tablet Take 1 tablet (30 mg total) by mouth daily. 01/30/23  Yes Jared Register, Jared [name]  metoprolol succinate (TOPROL-XL) 50 MG 24 hr  tablet Take 1 tablet (50 mg total) by mouth daily. Take with or immediately following a meal. 02/04/23  Yes Newlin, Enobong, Jared [name]  nitroGLYCERIN (NITROSTAT) 0.4 MG SL tablet Place 1 tablet (0.4 mg total) under the tongue every 5 (five) minutes as needed for chest pain. 11/19/20 04/30/23 Yes Albertine Grates, Jared [name]  OXYGEN Inhale 2 L/min into the lungs continuous.   Yes Provider, Historical, Jared [name]  pantoprazole (PROTONIX) 40 MG tablet Take 1 tablet (40 mg total) by mouth daily. 02/04/23  Yes Jared Register, Jared [name]  rivaroxaban (XARELTO) 20 MG TABS tablet Take 1 tablet (20 mg total) by mouth daily with supper. 10/25/22  Yes Jared Register, Jared [name]  Accu-Chek Softclix Lancets lancets Use to check blood sugar once daily. 01/09/23  Jared Register, Jared [name]  Blood Glucose Monitoring Suppl (ACCU-CHEK GUIDE) w/Device KIT Use to check blood sugar once daily. 01/30/23   Jared Register, Jared [name]  Budeson-Glycopyrrol-Formoterol (BREZTRI AEROSPHERE) 160-9-4.8 MCG/ACT AERO Take 2 puffs first thing in morning and then another 2 puffs about 12 hours later. 02/25/23   Nyoka Cowden, Jared [name]  glucose blood (ACCU-CHEK GUIDE) test strip Use to check blood sugar once daily. 01/09/23   Jared Register, Jared [name]  TUDORZA PRESSAIR 400 MCG/ACT AEPB INHALE 1 PUFF INTO THE LUNGS IN THE MORNING AND AT BEDTIME. 03/29/20 04/04/20  Nyoka Cowden, Jared [name]    Inpatient Medications: Scheduled Meds:  acetaminophen  1,000 mg Oral Q6H   arformoterol  15 mcg Nebulization BID   aspirin EC  81 mg Oral Daily   atorvastatin  80 mg Oral Daily   budesonide (PULMICORT) nebulizer solution  0.5 mg Nebulization BID   DULoxetine  60 mg Oral Daily   ezetimibe  10 mg Oral Daily   feeding supplement  1 Container Oral TID BM   ferrous sulfate  325 mg Oral Q breakfast   folic acid  1 mg Oral Daily   gabapentin  300 mg Oral BID   hydrALAZINE  50 mg Oral Q8H   isosorbide mononitrate  30 mg Oral Daily   metoprolol succinate  50 mg Oral Daily   multivitamin with minerals  1 tablet Oral Daily    nicotine  21 mg Transdermal Daily   pantoprazole  40 mg Oral Daily   revefenacin  175 mcg Nebulization Daily   Continuous Infusions:  heparin 1,600 Units/hr (05/02/23 1741)   lactated ringers 75 mL/hr at 05/02/23 1741   piperacillin-tazobactam (ZOSYN)  IV 12.5 mL/hr at 05/02/23 1741   PRN Meds: albuterol, bisacodyl, morphine injection, oxyCODONE  Allergies:    Allergies  Allergen Reactions   Lisinopril Cough    Social History:   Social History   Socioeconomic History   Marital status: Single    Spouse name: Not on file   Number of children: 1   Years of education: Not on file   Highest education level: 12th grade  Occupational History   Occupation: retired  Tobacco Use   Smoking status: Every Day    Current packs/day: 0.50    Average packs/day: 0.5 packs/day for 47.0 years (23.5 ttl pk-yrs)    Types: Cigarettes   Smokeless tobacco: Never   Tobacco comments:    4/5 cigs  Vaping Use   Vaping status: Never Used  Substance and Sexual Activity   Alcohol use: Not Currently    Alcohol/week: 0.0 standard drinks of alcohol    Comment: last drink was before xmas   Drug use: No    Types: Cocaine, Marijuana    Comment: "nothing in 20 years"   Sexual activity: Not Currently  Other Topics Concern   Not on file  Social History Narrative   unemployed   Social Determinants of Health   Financial Resource Strain: Medium Risk (02/06/2023)   Overall Financial Resource Strain (CARDIA)    Difficulty of Paying Living Expenses: Somewhat hard  Food Insecurity: No Food Insecurity (04/30/2023)   Hunger Vital Sign    Worried About Running Out of Food in the Last Year: Never true    Ran Out of Food in the Last Year: Never true  Transportation Needs: No Transportation Needs (04/30/2023)   PRAPARE - Administrator, Civil Service (Medical): No    Lack of Transportation (Non-Medical): No  Physical Activity: Insufficiently Active (  02/06/2023)   Exercise Vital Sign    Days of  Exercise per Week: 3 days    Minutes of Exercise per Session: 10 min  Stress: No Stress Concern Present (02/06/2023)   [employer] of Occupational Health - Occupational Stress Questionnaire    Feeling of Stress : Not at all  Social Connections: Moderately Integrated (02/06/2023)   Social Connection and Isolation Panel [NHANES]    Frequency of Communication with Friends and Family: More than three times a week    Frequency of Social Gatherings with Friends and Family: Twice a week    Attends Religious Services: More than 4 times per year    Active Member of [employer] or Organizations: Yes    Attends Engineer, structural: More than 4 times per year    Marital Status: Divorced  Intimate Partner Violence: Not At Risk (04/30/2023)   Humiliation, Afraid, Rape, and Kick questionnaire    Fear of Current or Ex-Partner: No    Emotionally Abused: No    Physically Abused: No    Sexually Abused: No    Family History:    Family History  Problem Relation Age of Onset   Heart disease Mother    Diabetes Mother    Colon cancer Mother    Liver cancer Mother    Cancer Father        type unknown   Diabetes Sister        x 2   Diabetes Brother      ROS:  Please see the history of present illness.   All other ROS reviewed and negative.     Physical Exam/Data:   Vitals:   05/02/23 1144 05/02/23 1355 05/02/23 1807 05/02/23 1810  BP: 108/75 101/73 105/67   Pulse: 100  (!) 130 (!) 130  Resp: 18  18   Temp: 98.3 F (36.8 C)  98.2 F (36.8 C)   TempSrc: Oral  Oral   SpO2: 93%  95%   Weight:      Height:        Intake/Output Summary (Last 24 hours) at 05/02/2023 1903 Last data filed at 05/02/2023 1846 Gross per 24 hour  Intake 2064.29 ml  Output 1035 ml  Net 1029.29 ml      05/01/2023    1:34 PM 04/30/2023   11:14 PM 04/24/2023   10:40 AM  Last 3 Weights  Weight (lbs) 219 lb 219 lb 2.2 oz 219 lb 12.8 oz  Weight (kg) 99.338 kg 99.4 kg 99.701 kg     Body mass index is  27.37 kg/m.  General:  Well nourished, well developed,  Appears uncomfortable   Denies CP    HEENT: normal  EEG leads being placed  Neck: no JVD Vascular: No carotid bruits; Distal pulses 2+ bilaterally Cardiac:  normal S1, S2; RRR; no murmurs  Lungs:  Wheezes on forced exp anteriorly   Rales at R base  Abd: Distended  Exam deferred   Ext: no edema Musculoskeletal:  No deformities, Skin: warm and dry  EKG:  The EKG was personally reviewed and demonstrates:  ST with PACs   112 bpm   IWMI   Telemetry:  Telemetry was personally reviewed and demonstrates:  SR/ST and Atrial fib   Max rate 140    Relevant CV Studies: Echo 05/02/23  1. Left ventricular ejection fraction, by estimation, is 55 to 60%. The  left ventricle has normal function. The left ventricle has no regional  wall motion abnormalities. Left ventricular diastolic parameters  are  consistent with Grade II diastolic  dysfunction (pseudonormalization).   2. Right ventricular systolic function is moderately reduced. The right  ventricular size is moderately enlarged. Tricuspid regurgitation signal is  inadequate for assessing PA pressure.   3. Left atrial size was mildly dilated.   4. The mitral valve is degenerative. No evidence of mitral valve  regurgitation. No evidence of mitral stenosis.   5. The aortic valve is tricuspid. Aortic valve regurgitation is not  visualized. Aortic valve sclerosis/calcification is present, without any  evidence of aortic stenosis.   6. There is a vague density in the IVC. Cannot rule out thrombus.  Recommend dedicated Abdominal CTA vs. Abdominal US for further evaluation.  The inferior vena cava is dilated in size with >50% respiratory  variability, suggesting right atrial pressure of   8 mmHg.   7. In setting of syncope as well as RV dysfunction, consider acute PE.  Recommend Chest CTA if clinically indicated.    Laboratory Data:  High Sensitivity Troponin:   Recent Labs  Lab  04/30/23 1144 04/30/23 1317 05/02/23 1717  TROPONINIHS 48* 36* 37*     Chemistry Recent Labs  Lab 04/30/23 1027 05/01/23 0749 05/02/23 0755  NA 135 135 135  K 3.6 3.3* 4.3  CL 98 96* 96*  CO2 27 29 30   GLUCOSE 129* 98 193*  BUN 12 8 14   CREATININE 1.65* 0.97 1.61*  CALCIUM 8.5* 8.2* 8.4*  GFRNONAA 45* >60 46*  ANIONGAP 10 10 9     Recent Labs  Lab 04/30/23 1144 05/02/23 0755  PROT 7.3 6.0*  ALBUMIN 3.7 2.7*  AST 32 21  ALT 18 14  ALKPHOS 72 57  BILITOT 1.3* 0.4   Lipids No results for input(s): "CHOL", "TRIG", "HDL", "LABVLDL", "LDLCALC", "CHOLHDL" in the last 168 hours.  Hematology Recent Labs  Lab 04/30/23 1027 05/01/23 0749 05/02/23 0755  WBC 13.4* 11.0* 10.2  RBC 3.81* 3.36* 3.65*  HGB 10.1* 8.8* 9.6*  HCT 34.6* 30.1* 33.2*  MCV 90.8 89.6 91.0  MCH 26.5 26.2 26.3  MCHC 29.2* 29.2* 28.9*  RDW 15.6* 15.6* 15.9*  PLT 306 246 355   Thyroid  Recent Labs  Lab 04/30/23 2200  TSH 2.125    BNP Recent Labs  Lab 05/02/23 1717  BNP 261.2*    DDimer No results for input(s): "DDIMER" in the last 168 hours.   Radiology/Studies:  NM Pulmonary Perf and Vent  Result Date: 05/02/2023 CLINICAL DATA:  Evaluate for pulmonary embolism. EXAM: NUCLEAR MEDICINE PERFUSION LUNG SCAN TECHNIQUE: Perfusion images were obtained in multiple projections after intravenous injection of radiopharmaceutical. Ventilation scans intentionally deferred if perfusion scan and chest x-ray adequate for interpretation during COVID 19 epidemic. RADIOPHARMACEUTICALS:  4 mCi Tc-61m MAA IV COMPARISON:  Chest radiograph-earlier same day; 04/30/2023 Chest CT-07/12/2022 FINDINGS: Review of chest radiograph performed earlier same day demonstrates unchanged enlarged cardiac silhouette and mediastinal contours. Old left-sided rib fractures. No focal airspace opacities. No pleural effusion or pneumothorax. No evidence of edema Perfusion imaging demonstrates mild heterogeneous perfusion of the  bilateral pulmonary parenchyma without discrete area of non perfusion to suggest pulmonary embolism. IMPRESSION: Pulmonary embolism absent. No discrete areas of non perfusion to suggest pulmonary embolism. Electronically Signed   By: Simonne Come M.D.   On: 05/02/2023 17:35   DG CHEST PORT 1 VIEW  Result Date: 05/02/2023 CLINICAL DATA:  Dyspnea EXAM: PORTABLE CHEST 1 VIEW COMPARISON:  None Available. FINDINGS: Normal mediastinum and cardiac silhouette. Normal pulmonary vasculature. No evidence of effusion,  infiltrate, or pneumothorax. No acute bony abnormality. IMPRESSION: Normal chest radiograph Electronically Signed   By: Genevive Bi M.D.   On: 05/02/2023 16:32   VAS [place] IVC/ILIAC (VENOUS ONLY)  Result Date: 05/02/2023 IVC/ILIAC STUDY Patient Name:  KLYE CHORLEY  Date of Exam:   05/02/2023 Medical Rec #: [id number]     Accession #:    [id number] Date of Birth: 03-30-1956      Patient Gender: M Patient Age:   63 years Exam Location:  [facility] Procedure:      VAS [place] IVC/ILIAC (VENOUS ONLY) Referring Phys: Jonah Blue --------------------------------------------------------------------------------  Indications: A vague density area of concern in the IVC was noted on echo exam. Limitations: Air/bowel gas and Status post appendectomy laparoscopic 05/01/23.  Performing Technologist: Marilynne Halsted RDMS, RVT  Examination Guidelines: A complete evaluation includes B-mode imaging, spectral Doppler, color Doppler, and power Doppler as needed of all accessible portions of each vessel. Bilateral testing is considered an integral part of a complete examination. Limited examinations for reoccurring indications may be performed as noted.  IVC/Iliac Findings: +----------+------+--------+-----------------+    IVC    PatentThrombus    Comments      +----------+------+--------+-----------------+ IVC Prox                poorly visualized +----------+------+--------+-----------------+ IVC Mid                  poorly visualized +----------+------+--------+-----------------+ IVC Distal              not visualized    +----------+------+--------+-----------------+    Summary: IVC/Iliac: Visualization of proximal Inferior Vena Cava, mid inferior vena cava and distal Inferior Vena Cava was limited. 2D imaging with color evaluation of the IVC was non- diagnostic due to patient's body habitus and air in the abdominal cavity.  *See table(s) above for measurements and observations.  Electronically signed by Carolynn Sayers on 05/02/2023 at 4:10:22 PM.   Final    ECHOCARDIOGRAM COMPLETE  Result Date: 05/02/2023    ECHOCARDIOGRAM REPORT   Patient Name:   RAYMIR BENDIK Date of Exam: 05/02/2023 Medical Rec #:  [id number]    Height:       75.0 in Accession #:    [id number]   Weight:       219.0 lb Date of Birth:  January 10, 1956     BSA:          2.281 m Patient Age:    67 years     BP:           107/69 mmHg Patient Gender: M            HR:           104 bpm. Exam Location:  Inpatient Procedure: 2D Echo, Color Doppler, Cardiac Doppler and Intracardiac            Opacification Agent Indications:    R55 Syncope  History:        Patient has prior history of Echocardiogram examinations, most                 recent 12/13/2021. CAD, COPD, Arrythmias:Atrial Fibrillation; Risk                 Factors:Hypertension and Diabetes.  Sonographer:    Irving Burton Senior RDCS Referring Phys: [id number] Francena [facility]ly [facility]  Sonographer Comments: Very poor echo windows due to body habitus and COPD IMPRESSIONS  1. Left ventricular ejection fraction, by estimation, is 55 to 60%. The left ventricle has  normal function. The left ventricle has no regional wall motion abnormalities. Left ventricular diastolic parameters are consistent with Grade II diastolic dysfunction (pseudonormalization).  2. Right ventricular systolic function is moderately reduced. The right ventricular size is moderately enlarged. Tricuspid regurgitation signal is inadequate for  assessing PA pressure.  3. Left atrial size was mildly dilated.  4. The mitral valve is degenerative. No evidence of mitral valve regurgitation. No evidence of mitral stenosis.  5. The aortic valve is tricuspid. Aortic valve regurgitation is not visualized. Aortic valve sclerosis/calcification is present, without any evidence of aortic stenosis.  6. There is a vague density in the IVC. Cannot rule out thrombus. Recommend dedicated Abdominal CTA vs. Abdominal US for further evaluation. The inferior vena cava is dilated in size with >50% respiratory variability, suggesting right atrial pressure of  8 mmHg.  7. In setting of syncope as well as RV dysfunction, consider acute PE. Recommend Chest CTA if clinically indicated. FINDINGS  Left Ventricle: Left ventricular ejection fraction, by estimation, is 55 to 60%. The left ventricle has normal function. The left ventricle has no regional wall motion abnormalities. Definity contrast agent was given IV to delineate the left ventricular  endocardial borders. The left ventricular internal cavity size was normal in size. There is no left ventricular hypertrophy. Left ventricular diastolic parameters are consistent with Grade II diastolic dysfunction (pseudonormalization). Normal left ventricular filling pressure. Right Ventricle: The right ventricular size is moderately enlarged. No increase in right ventricular wall thickness. Right ventricular systolic function is moderately reduced. Tricuspid regurgitation signal is inadequate for assessing PA pressure. Left Atrium: Left atrial size was mildly dilated. Right Atrium: Right atrial size was normal in size. Pericardium: There is no evidence of pericardial effusion. Presence of epicardial fat layer. Mitral Valve: The mitral valve is degenerative in appearance. There is mild calcification of the mitral valve leaflet(s). Mild to moderate mitral annular calcification. No evidence of mitral valve regurgitation. No evidence of mitral  valve stenosis. Tricuspid Valve: The tricuspid valve is normal in structure. Tricuspid valve regurgitation is trivial. No evidence of tricuspid stenosis. Aortic Valve: The aortic valve is tricuspid. Aortic valve regurgitation is not visualized. Aortic valve sclerosis/calcification is present, without any evidence of aortic stenosis. Pulmonic Valve: The pulmonic valve was normal in structure. Pulmonic valve regurgitation is not visualized. No evidence of pulmonic stenosis. Aorta: The aortic root is normal in size and structure. Venous: There is a vague density in the IVC. Cannot rule out thrombus. Recommend dedicated Abdominal CTA vs. Abdominal US for further evaluation. The inferior vena cava is dilated in size with greater than 50% respiratory variability, suggesting right atrial pressure of 8 mmHg. IAS/Shunts: No atrial level shunt detected by color flow Doppler.  LEFT VENTRICLE PLAX 2D LVIDd:         4.90 cm     Diastology LVIDs:         3.80 cm     LV e' medial:    6.20 cm/s LV PW:         1.10 cm     LV E/e' medial:  10.9 LV IVS:        1.00 cm     LV e' lateral:   6.31 cm/s LVOT diam:     2.10 cm     LV E/e' lateral: 10.7 LV SV:         47 LV SV Index:   21 LVOT Area:     3.46 cm  LV Volumes (MOD) LV vol d,  MOD A4C: 98.9 ml LV vol s, MOD A4C: 57.8 ml LV SV MOD A4C:     98.9 ml RIGHT VENTRICLE RV S prime:     9.36 cm/s TAPSE (M-mode): 1.9 cm LEFT ATRIUM             Index        RIGHT ATRIUM           Index LA diam:        3.30 cm 1.45 cm/m   RA Area:     23.70 cm LA Vol (A2C):   80.6 ml 35.34 ml/m  RA Volume:   71.30 ml  31.26 ml/m LA Vol (A4C):   74.4 ml 32.62 ml/m LA Biplane Vol: 81.7 ml 35.82 ml/m  AORTIC VALVE LVOT Vmax:   88.10 cm/s LVOT Vmean:  65.100 cm/s LVOT VTI:    0.135 m  AORTA Ao Root diam: 3.40 cm MITRAL VALVE MV Area (PHT): 3.39 cm    SHUNTS MV Decel Time: 224 msec    Systemic VTI:  0.14 m MV E velocity: 67.30 cm/s  Systemic Diam: 2.10 cm MV A velocity: 60.80 cm/s MV E/A ratio:  1.11  Armanda Magic Jared [name] Electronically signed by Armanda Magic Jared [name] Signature Date/Time: 05/02/2023/12:22:10 PM    Final    DG Chest Port 1 View  Result Date: 04/30/2023 CLINICAL DATA:  Shortness of breath and headache EXAM: PORTABLE CHEST 1 VIEW COMPARISON:  12/26/2022 FINDINGS: Numerous leads and wires project over the chest. Remote left rib fractures. Midline trachea. Borderline cardiomegaly. Atherosclerosis in the transverse aorta. Left costophrenic angle excluded. No pleural effusion or pneumothorax. Chronic interstitial thickening/coarsening there is likely related to COPD/chronic bronchitis given clinical history of smoking. No lobar consolidation. IMPRESSION: No acute cardiopulmonary disease. Peribronchial thickening which may relate to chronic bronchitis or smoking. Aortic Atherosclerosis (ICD10-I70.0). Electronically Signed   By: Jeronimo Greaves M.D.   On: 04/30/2023 16:55   CT ABDOMEN PELVIS W CONTRAST  Result Date: 04/30/2023 CLINICAL DATA:  Right lower quadrant pain, no bowel movement since last Thursday, testicular pain EXAM: CT ABDOMEN AND PELVIS WITH CONTRAST TECHNIQUE: Multidetector CT imaging of the abdomen and pelvis was performed using the standard protocol following bolus administration of intravenous contrast. RADIATION DOSE REDUCTION: This exam was performed according to the departmental dose-optimization program which includes automated exposure control, adjustment of the mA and/or kV according to patient size and/or use of iterative reconstruction technique. CONTRAST:  60mL OMNIPAQUE IOHEXOL 350 MG/ML SOLN COMPARISON:  11/18/2020 FINDINGS: Lower chest: Emphysema. No acute pleural or parenchymal lung disease. Hepatobiliary: No focal liver abnormality is seen. No gallstones, gallbladder wall thickening, or biliary dilatation. Pancreas: Unremarkable. No pancreatic ductal dilatation or surrounding inflammatory changes. Spleen: Normal in size without focal abnormality. Adrenals/Urinary Tract: Adrenal  glands are unremarkable. Kidneys are normal, without renal calculi, focal lesion, or hydronephrosis. Bladder is unremarkable. Stomach/Bowel: There is a dilated inflamed appendix within the right lower quadrant, measuring up to 16 mm in diameter. And appendicolith is seen at the appendiceal orifice. Marked periappendiceal fat stranding with evidence of micro perforation at the base of the appendix. No fluid collection or abscess. No bowel obstruction or ileus. Vascular/Lymphatic: Subcentimeter right lower quadrant mesenteric lymph nodes are likely reactive. No pathologic adenopathy. Atherosclerosis of the aorta and its branches. Reproductive: Prostate is unremarkable. Other: Mesenteric edema and trace free fluid within the right lower quadrant. Punctate extraluminal gas adjacent to the base of the appendix consistent with micro perforation. No other evidence of pneumoperitoneum. No abdominal  wall hernia. Musculoskeletal: No acute or destructive bony abnormalities. Reconstructed images demonstrate no additional findings. IMPRESSION: 1. Acute perforated appendicitis.  No fluid collection or abscess. 2.  Aortic Atherosclerosis (ICD10-I70.0). Critical Value/emergent results were called by telephone at the time of interpretation on 04/30/2023 at 4:29 pm to provider [facility], who verbally acknowledged these results. Electronically Signed   By: Sharlet Salina M.D.   On: 04/30/2023 16:32   US SCROTUM W/DOPPLER  Result Date: 04/30/2023 CLINICAL DATA:  Left testicular pain EXAM: SCROTAL ULTRASOUND DOPPLER ULTRASOUND OF THE TESTICLES TECHNIQUE: Complete ultrasound examination of the testicles, epididymis, and other scrotal structures was performed. Color and spectral Doppler ultrasound were also utilized to evaluate blood flow to the testicles. COMPARISON:  11/18/2020 FINDINGS: Right testicle Measurements: 2.5 x 4.0 x 2.2 cm. No mass or microlithiasis visualized. Left testicle Measurements: 2.1 x 3.9 x 1.7 cm. No mass  or microlithiasis visualized. Right epididymis: Incidental 3 mm epididymal cyst of doubtful clinical significance. Otherwise normal size and appearance. Left epididymis: Incidental 2 mm epididymal cyst of doubtful clinical significance. Otherwise normal size and appearance. Hydrocele:  None visualized. Varicocele:  None visualized. Pulsed Doppler interrogation of both testes demonstrates normal low resistance arterial and venous waveforms bilaterally. IMPRESSION: 1. Unremarkable testicular ultrasound. 2. Small bilateral epididymal cysts of likely no clinical significance. Electronically Signed   By: Sharlet Salina M.D.   On: 04/30/2023 16:11   CT Head Wo Contrast  Result Date: 04/30/2023 CLINICAL DATA:  frontal HA, sepsis EXAM: CT HEAD WITHOUT CONTRAST TECHNIQUE: Contiguous axial images were obtained from the base of the skull through the vertex without intravenous contrast. RADIATION DOSE REDUCTION: This exam was performed according to the departmental dose-optimization program which includes automated exposure control, adjustment of the mA and/or kV according to patient size and/or use of iterative reconstruction technique. COMPARISON:  None Available. FINDINGS: Brain: No hemorrhage. No hydrocephalus. No extra-axial fluid collection. No CT evidence of an acute cortical infarct. No mass effect. No mass lesion. Mineralization of the basal ganglia bilaterally. Vascular: No hyperdense vessel or unexpected calcification. Skull: Normal. Negative for fracture or focal lesion. Sinuses/Orbits: No middle ear effusion. There is a trace left mastoid effusion. Paranasal sinuses are clear. Bilateral lens replacement. Orbits are otherwise unremarkable. Other: None. IMPRESSION: 1. No CT finding to explain frontal headache. 2. Trace left mastoid effusion. Electronically Signed   By: Lorenza Cambridge M.D.   On: 04/30/2023 14:44     Assessment and Plan:   RV dysfunction   New for patient Echo also noted suspicious dnesity in  IVC Note V/Q scan is negative for PE    USN of abdomen was nondiagnostic He has a hx of COPD but echo in 2023 showed normal RV function   Could have progressed since then      REcommendations: Continue heparin for now THen Xarelto     O2 to keep sats greater than 92% WOuld continue to pursue evaluation of IVC   2  HFpEF Echo today LVEF normal with diastolic dysfunction   PT with intermitt afib with RVR on tele which may also add to dysfunction  On exam, volume appears to be up   He has wheezes and rales on lung exam  Getting IV fluids at 75 cc  per hour WOuld repeat BMET now and in am  WOuld give lasix if Cr improved       3 Syncope    Pt denied spells of dizziness when I saw him in [place]  HE is not feeling good now, will pursue hx when he is feeling better  Just hooked up for EEG      4  paroxysmal atrial fib/flutter  Had been on Xarelto before admit   INtermittent here post op   On metoprolol  5  CAD  Mild by cath in 2012  Myoview in 2016 was normal   Pt had had no symptoms  SOme chest pain earlier   None now   Follow   5  COPD  Pt on chronic O2  Native lung dz may be contrib to wheezing as well  6  GI    Now s/p appy for perforated appendix   Has had hx of GI bleeding   Had clip placed this year  No active bleed   7  Hx CVA  had been on Xarelto prior to admit    7   PAD   s/p L fem pop with stenting to this in June 2024  6    Tob     Continued to smoke when I saw him  last           For questions or updates, please contact [facility] Please consult [email or web address] for contact info under    Signed, Dietrich Pates, Jared [name]  05/02/2023 7:03 PM

## 2023-05-02 NOTE — Consult Note (Signed)
Initial Consultation Note   Patient: Jared [name] FIE:[id number] DOB: 1956/06/07 PCP: [name], MD DOA: 04/30/2023 DOS: the patient was seen and examined on 05/02/2023 Primary service: Montez Morita, Md, MD   Assessment and Plan:  Sepsis due to acute perforated appendicitis with microperforation Surgery consulted and recommended holding Xarelto and keeping n.p.o. except for sips with meds Underwent lap appy on 11/20 - visible appendiceal perforation noted with abscess that was drained and fecaliths removed; surgical drain was left in place Continue IV Zosyn Blood cultures pending Surgery assumed care post-operatively Abdomen remains sore and distended with drain in LLQ   AKI (acute kidney injury)  Prerenal AKI on presentation with elevated creatinine of 1.69 from prior of 1 Resolved with IVF Recurred post-operatively on 11/21 and so IVF resumed   Syncope and collapse Patient reports 2 years of intermittent generalized body shaking with syncope and loss of consciousness that are often positional when going from sitting to standing. Last echocardiogram was 12/2021 with EF of 45 to 50% and global hypokinesis, grade 1 diastolic dysfunction.  No significant valvular abnormality. Repeat echo shows preserved EF, grade 2 diastolic dysfunction with moderate reduction in RV function and RV dilatation and a vague density in the IVC, concerning for possible PE/IVC thrombus STAT VQ scan and abdominal US to rule out PE/IVC thrombus  Cardiology consulted Will treat empirically with Heparin for now   IDA Patient with h/o iron deficiency anemia Receives weekly IV iron infusions, due today Hgb 8.8 on presentation, down from 10/6 on 11/1 Given IV iron dose on 11/21 as per patient request Hgb improved to 9.6 on 11/22 post-iron   Chronic respiratory failure with hypoxia  Secondary to COPD on 2L Stable although with wheezing on exam at time of admission (none this AM) PRN duoneb, Albuterol Continue  Breztri Nurse reported wheezing this AM but he appeared more comfortable at the time of my evaluation   Essential hypertension Continue hydralazine, Imdur, metoprolol Holding Lasix due to recurrent AKI   HLD Continue Zetia, atorvastatin   Atrial flutter  Continue Toprol XL Holding Xarelto due to surgery Starting Heparin infusion on 11/21   History of CVA (cerebrovascular accident) Continue aspirin Xarelto currently on hold    Peripheral vascular disease (HCC) Continue aspirin, statin   Depression Continue Cymbalta, gabapentin   Tobacco use Ongoing use of close to 1 pack/day Nicotine patch offered       Consultants: Surgery   Procedures: Appendectomy 11/20   Antibiotics: Zosyn 11/19-   TR[facility] will continue to follow the patient.  HPI: Jared [name] is a 67yo with h/o chronic combined CHF, HTN, CVA, afib on Xarelto, PVD, and COPD on 2L home O2 who presented on 11/19 with SOB and other complaints. Fever 100.9, WBC 13.4, lactate 2.9, Creatinine 1.65 with AKI. CT A?P with acute perforated appendicitis with microperforation. Admitted for IV antibiotics, Xarelto washout. Surgery took to the OR on 11/20, patient with Blake drain in place post-operatively.  Surgery has assumed care.   Review of Systems: As mentioned in the history of present illness. All other systems reviewed and are negative.  Limited somewhat by somnolence.  Past Medical History:  Diagnosis Date   AVM (arteriovenous malformation)    CAD (coronary artery disease)    a. LHC 5/12:  LAD 20, pLCx 20, pRCA 40, dRCA 40, EF 35%, diff HK  //  b. Myoview 4/16: Overall Impression:  High risk stress nuclear study There is no evidence of ischemia.  There is severe LV dysfunction. LV  Ejection Fraction: 30%.  LV Wall Motion:  There is global LV hypokinesis.     CAP (community acquired pneumonia) 09/2013   Chronic combined systolic and diastolic CHF (congestive heart failure) (HCC)    a. Echo 4/16:Mild LVH, EF 40-45%,  diffuse HK //  b. Echo 8/17: EF 35-40%, diffuse HK, diastolic dysfunction, aortic sclerosis, trivial MR, moderate LAE, normal RVSF, moderate RAE, mild TR, PASP 42 mmHg // c. Echo 4/18: Mild concentric LVH, EF 30-35, normal wall motion, grade 1 diastolic dysfunction, PASP 49   Chronic respiratory failure (HCC)    Cluster headache    "hx; haven't had one in awhile" (01/09/2016)   COPD (chronic obstructive pulmonary disease) (HCC)    [name] 01/09/2016   DM (diabetes mellitus) (HCC)    History of CVA (cerebrovascular accident)    Hypertension    IDA (iron deficiency anemia)    Moderate tobacco use disorder    NICM (nonischemic cardiomyopathy) (HCC)    Nicotine addiction    Tobacco abuse    Type 2 diabetes mellitus (HCC) 05/14/2016   Past Surgical History:  Procedure Laterality Date   ABDOMINAL AORTOGRAM W/LOWER EXTREMITY N/A 12/07/2022   Procedure: ABDOMINAL AORTOGRAM W/LOWER EXTREMITY;  Surgeon: Leonie Douglas, MD;  Location: [facility];  Service: Cardiovascular;  Laterality: N/A;   BIOPSY  11/12/2020   Procedure: BIOPSY;  Surgeon: Lemar Lofty., MD;  Location: [facility] ENDOSCOPY;  Service: Gastroenterology;;   CARDIAC CATHETERIZATION  10/2010   LM normal, LAD with 20% irregularities, LCX with 20%, RCA with 40% prox and 40% distal - EF of 35%   CATARACT EXTRACTION, BILATERAL     COLONOSCOPY W/ BIOPSIES AND POLYPECTOMY     COLONOSCOPY WITH PROPOFOL N/A 09/06/2018   Procedure: COLONOSCOPY WITH PROPOFOL;  Surgeon: Tressia Danas, MD;  Location: [facility] ENDOSCOPY;  Service: Gastroenterology;  Laterality: N/A;   ENTEROSCOPY N/A 09/28/2018   Procedure: ENTEROSCOPY;  Surgeon: Jeani Hawking, MD;  Location: [facility] ENDOSCOPY;  Service: Endoscopy;  Laterality: N/A;   ENTEROSCOPY N/A 10/28/2018   Procedure: ENTEROSCOPY;  Surgeon: Tressia Danas, MD;  Location: [facility] ENDOSCOPY;  Service: Gastroenterology;  Laterality: N/A;   ENTEROSCOPY N/A 10/09/2020   Procedure: ENTEROSCOPY;  Surgeon: Hilarie Fredrickson,  MD;  Location: [facility] ENDOSCOPY;  Service: Endoscopy;  Laterality: N/A;   ENTEROSCOPY N/A 03/22/2022   Procedure: ENTEROSCOPY;  Surgeon: Benancio Deeds, MD;  Location: [facility] ENDOSCOPY;  Service: Gastroenterology;  Laterality: N/A;   ESOPHAGOGASTRODUODENOSCOPY N/A 11/12/2020   Procedure: ESOPHAGOGASTRODUODENOSCOPY (EGD);  Surgeon: Lemar Lofty., MD;  Location: [facility] ENDOSCOPY;  Service: Gastroenterology;  Laterality: N/A;   ESOPHAGOGASTRODUODENOSCOPY (EGD) WITH PROPOFOL N/A 09/05/2018   Procedure: ESOPHAGOGASTRODUODENOSCOPY (EGD) WITH PROPOFOL;  Surgeon: Benancio Deeds, MD;  Location: [facility] ENDOSCOPY;  Service: Gastroenterology;  Laterality: N/A;   ESOPHAGOGASTRODUODENOSCOPY (EGD) WITH PROPOFOL N/A 11/19/2020   Procedure: ESOPHAGOGASTRODUODENOSCOPY (EGD) WITH PROPOFOL;  Surgeon: Beverley Fiedler, MD;  Location: [facility] ENDOSCOPY;  Service: Gastroenterology;  Laterality: N/A;   ESOPHAGOGASTRODUODENOSCOPY (EGD) WITH PROPOFOL N/A 12/27/2022   Procedure: ESOPHAGOGASTRODUODENOSCOPY (EGD) WITH PROPOFOL;  Surgeon: Napoleon Form, MD;  Location: [facility] ENDOSCOPY;  Service: Gastroenterology;  Laterality: N/A;   EXCISION MASS HEAD     GIVENS CAPSULE STUDY N/A 09/06/2018   Procedure: GIVENS CAPSULE STUDY;  Surgeon: Tressia Danas, MD;  Location: [facility] ENDOSCOPY;  Service: Gastroenterology;  Laterality: N/A;   GIVENS CAPSULE STUDY N/A 09/26/2018   Procedure: GIVENS CAPSULE STUDY;  Surgeon: Beverley Fiedler, MD;  Location: [facility] ENDOSCOPY;  Service: Gastroenterology;  Laterality: N/A;  GIVENS CAPSULE STUDY N/A 06/14/2019   Procedure: GIVENS CAPSULE STUDY;  Surgeon: Napoleon Form, MD;  Location: [facility] ENDOSCOPY;  Service: Endoscopy;  Laterality: N/A;   HEMOSTASIS CLIP PLACEMENT  11/12/2020   Procedure: HEMOSTASIS CLIP PLACEMENT;  Surgeon: Lemar Lofty., MD;  Location: [facility] ENDOSCOPY;  Service: Gastroenterology;;   HEMOSTASIS CONTROL  11/12/2020   Procedure: HEMOSTASIS CONTROL;  Surgeon: Lemar Lofty., MD;   Location: [facility] ENDOSCOPY;  Service: Gastroenterology;;   HOT HEMOSTASIS N/A 10/28/2018   Procedure: HOT HEMOSTASIS (ARGON PLASMA COAGULATION/BICAP);  Surgeon: Tressia Danas, MD;  Location: [facility] ENDOSCOPY;  Service: Gastroenterology;  Laterality: N/A;   HOT HEMOSTASIS N/A 11/12/2020   Procedure: HOT HEMOSTASIS (ARGON PLASMA COAGULATION/BICAP);  Surgeon: Lemar Lofty., MD;  Location: [facility] ENDOSCOPY;  Service: Gastroenterology;  Laterality: N/A;   HOT HEMOSTASIS N/A 03/22/2022   Procedure: HOT HEMOSTASIS (ARGON PLASMA COAGULATION/BICAP);  Surgeon: Benancio Deeds, MD;  Location: [facility] ENDOSCOPY;  Service: Gastroenterology;  Laterality: N/A;   INCISION AND DRAINAGE PERIRECTAL ABSCESS N/A 06/05/2017   Procedure: IRRIGATION AND DEBRIDEMENT PERIRECTAL ABSCESS;  Surgeon: Andria Meuse, MD;  Location: [facility] OR;  Service: General;  Laterality: N/A;   LAPAROSCOPIC APPENDECTOMY N/A 05/01/2023   Procedure: APPENDECTOMY LAPAROSCOPIC;  Surgeon: Emelia Loron, MD;  Location[facility] OR;  Service: General;  Laterality: N/A;   PERIPHERAL VASCULAR INTERVENTION  12/07/2022   Procedure: PERIPHERAL VASCULAR INTERVENTION;  Surgeon: Leonie Douglas, MD;  Location: [facility];  Service: Cardiovascular;;   SUBMUCOSAL TATTOO INJECTION  11/12/2020   Procedure: SUBMUCOSAL TATTOO INJECTION;  Surgeon: Lemar Lofty., MD;  Location: [facility] ENDOSCOPY;  Service: Gastroenterology;;   VIDEO BRONCHOSCOPY Bilateral 05/08/2016   Procedure: VIDEO BRONCHOSCOPY WITH FLUORO;  Surgeon: Oretha Milch, MD;  Location: [facility] ENDOSCOPY;  Service: Cardiopulmonary;  Laterality: Bilateral;   Social History:  reports that he has been smoking cigarettes. He has a 23.5 pack-year smoking history. He has never used smokeless tobacco. He reports that he does not currently use alcohol. He reports that he does not use drugs.  Allergies  Allergen Reactions   Lisinopril Cough    Family History  Problem Relation Age of Onset    Heart disease Mother    Diabetes Mother    Colon cancer Mother    Liver cancer Mother    Cancer Father        type unknown   Diabetes Sister        x 2   Diabetes Brother     Prior to Admission medications   Medication Sig Start Date End Date Taking? Authorizing Provider  acetaminophen (TYLENOL) 500 MG tablet Take 2 tablets (1,000 mg total) by mouth every 8 (eight) hours as needed for moderate pain. 01/03/23  Yes Newlin, Enobong, MD  albuterol (VENTOLIN HFA) 108 (90 Base) MCG/ACT inhaler INHALE 2 PUFFS INTO THE LUNGS EVERY 6 (SIX) HOURS AS NEEDED FOR WHEEZING OR SHORTNESS OF BREATH. Patient taking differently: Inhale 2 puffs into the lungs daily as needed for wheezing or shortness of breath. 12/06/22  Yes [name], MD  aspirin EC 81 MG tablet Take 1 tablet (81 mg total) by mouth daily. Swallow whole. 12/07/22  Yes Leonie Douglas, MD  atorvastatin (LIPITOR) 80 MG tablet Take 1 tablet (80 mg total) by mouth daily. 02/04/23  Yes [name], MD  DULoxetine (CYMBALTA) 60 MG capsule Take 1 capsule (60 mg total) by mouth daily. For chronic leg pains 01/09/23  Yes [name], MD  ergocalciferol (DRISDOL) 1.25 MG (50000  UT) capsule Take 1 capsule (50,000 Units total) by mouth once a week. 02/26/23  Yes [name], MD  ezetimibe (ZETIA) 10 MG tablet Take 1 tablet (10 mg total) by mouth daily. 01/31/23  Yes Pricilla Riffle, MD  ferrous sulfate (FEROSUL) 325 (65 FE) MG tablet Take 1 tablet (325 mg total) by mouth daily with breakfast. 01/30/23  Yes Danis, Andreas Blower, MD  folic acid (FOLVITE) 1 MG tablet Take 1 tablet (1 mg total) by mouth daily. 01/30/23  Yes [name], MD  furosemide (LASIX) 40 MG tablet Take 1-2 tablets (40-80 mg total) by mouth 2 (two) times daily as needed.Take an extra pill with worsening pedal edema. 01/09/23  Yes [name], MD  gabapentin (NEURONTIN) 300 MG capsule Take 1 capsule (300 mg total) by mouth 2 (two) times daily. 02/25/23  Yes Kathryne Hitch, MD  hydrALAZINE (APRESOLINE) 50 MG tablet Take 1 tablet (50 mg total) by mouth every 8 (eight) hours. 02/04/23  Yes [name], MD  isosorbide mononitrate (IMDUR) 30 MG 24 hr tablet Take 1 tablet (30 mg total) by mouth daily. 01/30/23  Yes [name], MD  metoprolol succinate (TOPROL-XL) 50 MG 24 hr tablet Take 1 tablet (50 mg total) by mouth daily. Take with or immediately following a meal. 02/04/23  Yes Newlin, Enobong, MD  nitroGLYCERIN (NITROSTAT) 0.4 MG SL tablet Place 1 tablet (0.4 mg total) under the tongue every 5 (five) minutes as needed for chest pain. 11/19/20 04/30/23 Yes Albertine Grates, MD  OXYGEN Inhale 2 L/min into the lungs continuous.   Yes [provider]  pantoprazole (PROTONIX) 40 MG tablet Take 1 tablet (40 mg total) by mouth daily. 02/04/23  Yes [name], MD  rivaroxaban (XARELTO) 20 MG TABS tablet Take 1 tablet (20 mg total) by mouth daily with supper. 10/25/22  Yes [name], MD  Accu-Chek Softclix Lancets lancets Use to check blood sugar once daily. 01/09/23   [name], MD  Blood Glucose Monitoring Suppl (ACCU-CHEK GUIDE) w/Device KIT Use to check blood sugar once daily. 01/30/23   [name], MD  Budeson-Glycopyrrol-Formoterol (BREZTRI AEROSPHERE) 160-9-4.8 MCG/ACT AERO Take 2 puffs first thing in morning and then another 2 puffs about 12 hours later. 02/25/23   Nyoka Cowden, MD  glucose blood (ACCU-CHEK GUIDE) test strip Use to check blood sugar once daily. 01/09/23   [name], MD  TUDORZA PRESSAIR 400 MCG/ACT AEPB INHALE 1 PUFF INTO THE LUNGS IN THE MORNING AND AT BEDTIME. 03/29/20 04/04/20  Nyoka Cowden, MD    Physical Exam: Vitals:   05/02/23 0341 05/02/23 0547 05/02/23 0548 05/02/23 0828  BP: 106/67 (!) 116/55 (!) 116/55 100/73  Pulse: (!) 103 (!) 103  99  Resp: 18 20  18   Temp: 98.3 F (36.8 C) 97.8 F (36.6 C)  97.9 F (36.6 C)  TempSrc: Oral Oral  Oral  SpO2: 92% 94%  94%  Weight:      Height:          Intake/Output Summary (Last 24 hours) at 05/02/2023 0832 Last data filed at 05/02/2023 1610 Gross per 24 hour  Intake 1777 ml  Output 770 ml  Net 1007 ml   Filed Weights   04/30/23 2314 05/01/23 1334  Weight: 99.4 kg 99.3 kg    Exam:  General:  Appears somnolent, on [place] O2; able to interact but less so than yesterday Eyes:   normal lids, iris ENT:  grossly normal hearing, lips & tongue, mmm Neck:  no LAD, masses or thyromegaly Cardiovascular:  RR with mild tachycardia. No LE edema.  Respiratory:   CTA bilaterally with no wheezes/rales/rhonchi.  Normal respiratory effort. Abdomen:  distended, surgical wounds covered with steristrips, drain in place in LLQ with serosanguinous fluid Skin:  no rash or induration seen on limited exam Musculoskeletal:  no bony abnormality Psychiatric:  blunted mood and affect, speech sparse but appropriate, AOx3 Neurologic:  CN 2-12 grossly intact, moves all extremities in coordinated fashion  Data Reviewed: I have reviewed the patient's lab results since admission.  Pertinent labs for today include:   Glucose 193 BUN 14/Creatinine 1.61/GFR 46; 8/0.97/:60 yesterday Albumin 2.7 WBC 10.2 Hgb 9.6    Family Communication: None present; I spoke with his sister by telephone  Primary team communication: Secure chat communication Thank you very much for involving [place] in the care of your patient.  Author: Jonah Blue, MD 05/02/2023 8:30 AM  For on call review [email or web address].

## 2023-05-02 NOTE — Consult Note (Signed)
NAME:  Jared [name], MRN:  [id number], DOB:  1956/01/21, LOS: 2 ADMISSION DATE:  04/30/2023, CONSULTATION DATE:  11/21 REFERRING MD:  Ophelia Charter, CHIEF COMPLAINT:  possible PE   History of Present Illness:  This is a 67 year old male who was admitted on 11/19 for abd pain, testicular pain. Had associated decreased PO intake and HA. Also reported intermittent h/o if syncope over last 2 yrs. Would occur suddenly and often after standing. His LOC would be brief. Denied loss of bowel or bladder. Has had several provider visits over years w/ no mention of this (most recently w/ cards in August) Dx eval in ER demonstrated acute appendicitis w/ microperf, + sepsis and AKI. He was admitted. Placed on IV abx and IV hydration. Because his last xarelto was on 18th surgery was placed on hold for DOAC washout. He went to OR 11/20 underwent lap appendectomy w/ washout and placement of 19 fr blake drain. Surgery felt to go well. As part of eval re: the episodes of syncope at [facility] was obtained. This showed gd 2 diastolic dysfxn, w/ mod to reduced RV fxn, RV dilation and vague density in IVC raising concern for thrombus.  Cardiology was called.  Surgery was contacted by IM service and were OK w/ IV heparin so he was started on IV anticoagulation He had stat VQ and LE  and abd [place] ordered.  Critical care asked to eval  Lab eval on day of consult.  Hgb 9.6 (up from 8.8), no sig leukocytosis 10.2 (highest was 13.4) cr 1.61 down from 1.65. 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 + pain from surgical site. Had 1 episode of left upper ext tremor like  activity. Pt had no LOC, affected left arm/side only lasted ~ 20 seconds and resolved spont  Objective   Blood pressure 101/73, pulse 100, temperature 98.3 F (36.8 C), temperature source Oral, resp. rate 18, height 6\' 3"  (1.905 m), weight 99.3 kg, SpO2 93%.        Intake/Output Summary (Last 24 hours) at 05/02/2023 1411 Last data filed at 05/02/2023 1300 Gross per 24 hour  Intake 2137 ml  Output 1185 ml  Net 952 ml   Filed Weights   04/30/23 2314 05/01/23 1334  Weight: 99.4 kg 99.3 kg    Examination: General: resting in bed no distress HENT: ncat NO jvd  Lungs: Exp wheeze no accessory use Cardiovascular: reg irreg tachy af  Abdomen: dist staples intact. Hypoactive scant bloody output from left drain  Extremities: warm no sig edema  Neuro: awake, oriented x 3. No st def but did have witnessed tremor of LUE that started spont and ended spont. Rapid. Not extended to other limbs GU: voids   Resolved Hospital Problem list     Assessment & Plan:  Acute appendicitis w/ peritonitis now s/p  lap appendectomy w/ washout and placement of 19 fr blake drain on 11/20 Plan Cont zosyn now day 4. Length or Rx still TBD but would keep while has drain in at least F/u pending cultures Drain rx per surgical team  Diet to be  addressed by surgical team (currently clears only w/ concern for high risk of post-op ileus)  Vague density in IVC w/ New mod reduced RV fxn and increased RV size  This does raise concern for thrombus -had been on xarelto for AF, this has been held since 18th. He reports seldom misses his DOAC.  Plan Getting 12 lead, trop I, lactate and BNP. Also team has ordered LE [place] of IVC and VQ scan (scr elevated so CTa not ordered)  Getting IV heparin, would continue.  Would not be a candidate for systemic lytics and would be concerned even catheter directed would yield higher risk of bleeding Can prob go back to DOAC once treated w/ IV heparin for at least 48 hrs and tolerating  diet  H/o chronic resp failure, COPD and active tobacco abuse w/ exertional dyspnea -did get SOB while talking. Does have mild weeze Plan Cont pulse ox Humidify O2 Scheduled BDs (change to nebs while acutely ill) Will hold off on systemic steroids but ensure he is still getting ICS Stop smoking   H/o afib (previously in xarelto), h/o PVD Plan Rate control  Tele  IV heparin as above  Eventually DOAC  HFmrEF w/ grade II diastolic HF  HTN Plan Cont tele  Cont imdur, beta blocker, hydralazine  Intermittent syncope and left upper extremity rapid repetitive tremor. Has remote h/o CVA -on-going > 2 yrs. At times associated w/ what he feels like is rapid HR. No correlation exertion or position. I witnessed the left upper extremity tremor. There was no activity that instigated it and it  subsides spont.  Plan Cont tele to r/o possible arrhythmia related   Will order carotid dopplers Ask Neuro to see.  Prob needs MRI and EEG  Cont  asa and statin   H/o IDA w/ expected post-op anemia Hgb stable Plan Trend  Trigger for transfusion < 7 or active bleeding     Best Practice (right click and "Reselect all SmartList Selections" daily)  Per primary   Labs   CBC: Recent Labs  Lab 04/30/23 1027 05/01/23 0749 05/02/23 0755  WBC 13.4* 11.0* 10.2  HGB 10.1* 8.8* 9.6*  HCT 34.6* 30.1* 33.2*  MCV 90.8 89.6 91.0  PLT 306 246 355    Basic Metabolic Panel: Recent Labs  Lab 04/30/23 1027 05/01/23 0749 05/02/23 0755  NA 135 135 135  K 3.6 3.3* 4.3  CL 98 96* 96*  CO2 27 29 30   GLUCOSE 129* 98 193*  BUN 12 8 14   CREATININE 1.65* 0.97 1.61*  CALCIUM 8.5* 8.2* 8.4*   GFR: Estimated Creatinine Clearance: 51.8 mL/min (A) (by C-G formula based on SCr of 1.61 mg/dL (H)). Recent Labs  Lab 04/30/23 1027 04/30/23 1251 04/30/23 1333 05/01/23 0749 05/02/23 0755  WBC 13.4*  --   --  11.0* 10.2  LATICACIDVEN  --  2.9* 1.7  --   --     Liver Function Tests: Recent Labs  Lab  04/30/23 1144 05/02/23 0755  AST 32 21  ALT 18 14  ALKPHOS 72 57  BILITOT 1.3* 0.4  PROT 7.3 6.0*  ALBUMIN 3.7 2.7*   No results for input(s): "LIPASE", "AMYLASE" in the last 168 hours. No results for input(s): "AMMONIA" in the last 168 hours.  ABG    Component Value Date/Time   PHART 7.278 (L) 01/31/2021 0450   PCO2ART 89.5 (HH) 01/31/2021 0450   PO2ART 63.6 (L) 01/31/2021 0450   HCO3 28.6 (H) 12/26/2022 0947   TCO2 30 12/27/2022  8295   O2SAT 77 12/26/2022 0947     Coagulation Profile: Recent Labs  Lab 04/30/23 1144  INR 1.7*    Cardiac Enzymes: No results for input(s): "CKTOTAL", "CKMB", "CKMBINDEX", "TROPONINI" in the last 168 hours.  HbA1C: HbA1c, POC (controlled diabetic range)  Date/Time Value Ref Range Status  10/25/2022 09:41 AM 5.0 0.0 - 7.0 % Final  12/18/2021 11:00 AM 7.0 0.0 - 7.0 % Final   Hgb A1c MFr Bld  Date/Time Value Ref Range Status  03/21/2022 05:50 PM 5.6 4.8 - 5.6 % Final    Comment:    (NOTE) Pre diabetes:          5.7%-6.4%  Diabetes:              >6.4%  Glycemic control for   <7.0% adults with diabetes     CBG: Recent Labs  Lab 05/01/23 1527 05/02/23 0417 05/02/23 0914 05/02/23 1106  GLUCAP 115* 227* 181* 159*    Review of Systems:   Review of Systems  Constitutional: Negative.   HENT: Negative.    Eyes: Negative.   Respiratory:  Positive for shortness of breath and wheezing.   Cardiovascular:  Positive for leg swelling.  Gastrointestinal: Negative.   Genitourinary: Negative.   Musculoskeletal:  Positive for joint pain.  Skin: Negative.   Neurological:  Positive for dizziness, sensory change and loss of consciousness.  Endo/Heme/Allergies: Negative.      Past Medical History:  He,  has a past medical history of AVM (arteriovenous malformation), CAD (coronary artery disease), CAP (community acquired pneumonia) (09/2013), Chronic combined systolic and diastolic CHF (congestive heart failure) (HCC), Chronic  respiratory failure (HCC), Cluster headache, COPD (chronic obstructive pulmonary disease) (HCC), DM (diabetes mellitus) (HCC), History of CVA (cerebrovascular accident), Hypertension, IDA (iron deficiency anemia), Moderate tobacco use disorder, NICM (nonischemic cardiomyopathy) (HCC), Nicotine addiction, Tobacco abuse, and Type 2 diabetes mellitus (HCC) (05/14/2016).   Surgical History:   Past Surgical History:  Procedure Laterality Date   ABDOMINAL AORTOGRAM W/LOWER EXTREMITY N/A 12/07/2022   Procedure: ABDOMINAL AORTOGRAM W/LOWER EXTREMITY;  Surgeon: Leonie Douglas, MD;  Location: [facility];  Service: Cardiovascular;  Laterality: N/A;   BIOPSY  11/12/2020   Procedure: BIOPSY;  Surgeon: Lemar Lofty., MD;  Location: [facility] ENDOSCOPY;  Service: Gastroenterology;;   CARDIAC CATHETERIZATION  10/2010   LM normal, LAD with 20% irregularities, LCX with 20%, RCA with 40% prox and 40% distal - EF of 35%   CATARACT EXTRACTION, BILATERAL     COLONOSCOPY W/ BIOPSIES AND POLYPECTOMY     COLONOSCOPY WITH PROPOFOL N/A 09/06/2018   Procedure: COLONOSCOPY WITH PROPOFOL;  Surgeon: Tressia Danas, MD;  Location: [facility] ENDOSCOPY;  Service: Gastroenterology;  Laterality: N/A;   ENTEROSCOPY N/A 09/28/2018   Procedure: ENTEROSCOPY;  Surgeon: Jeani Hawking, MD;  Location: [facility] ENDOSCOPY;  Service: Endoscopy;  Laterality: N/A;   ENTEROSCOPY N/A 10/28/2018   Procedure: ENTEROSCOPY;  Surgeon: Tressia Danas, MD;  Location: [facility] ENDOSCOPY;  Service: Gastroenterology;  Laterality: N/A;   ENTEROSCOPY N/A 10/09/2020   Procedure: ENTEROSCOPY;  Surgeon: Hilarie Fredrickson, MD;  Location: [facility] ENDOSCOPY;  Service: Endoscopy;  Laterality: N/A;   ENTEROSCOPY N/A 03/22/2022   Procedure: ENTEROSCOPY;  Surgeon: Benancio Deeds, MD;  Location: [facility] ENDOSCOPY;  Service: Gastroenterology;  Laterality: N/A;   ESOPHAGOGASTRODUODENOSCOPY N/A 11/12/2020   Procedure: ESOPHAGOGASTRODUODENOSCOPY (EGD);  Surgeon: Lemar Lofty.,  MD;  Location: [facility] ENDOSCOPY;  Service: Gastroenterology;  Laterality: N/A;   ESOPHAGOGASTRODUODENOSCOPY (EGD) WITH PROPOFOL N/A 09/05/2018  Procedure: ESOPHAGOGASTRODUODENOSCOPY (EGD) WITH PROPOFOL;  Surgeon: Benancio Deeds, MD;  Location: [facility] ENDOSCOPY;  Service: Gastroenterology;  Laterality: N/A;   ESOPHAGOGASTRODUODENOSCOPY (EGD) WITH PROPOFOL N/A 11/19/2020   Procedure: ESOPHAGOGASTRODUODENOSCOPY (EGD) WITH PROPOFOL;  Surgeon: Beverley Fiedler, MD;  Location: [facility] ENDOSCOPY;  Service: Gastroenterology;  Laterality: N/A;   ESOPHAGOGASTRODUODENOSCOPY (EGD) WITH PROPOFOL N/A 12/27/2022   Procedure: ESOPHAGOGASTRODUODENOSCOPY (EGD) WITH PROPOFOL;  Surgeon: Napoleon Form, MD;  Location: [facility] ENDOSCOPY;  Service: Gastroenterology;  Laterality: N/A;   EXCISION MASS HEAD     GIVENS CAPSULE STUDY N/A 09/06/2018   Procedure: GIVENS CAPSULE STUDY;  Surgeon: Tressia Danas, MD;  Location: [facility] ENDOSCOPY;  Service: Gastroenterology;  Laterality: N/A;   GIVENS CAPSULE STUDY N/A 09/26/2018   Procedure: GIVENS CAPSULE STUDY;  Surgeon: Beverley Fiedler, MD;  Location: [facility] ENDOSCOPY;  Service: Gastroenterology;  Laterality: N/A;   GIVENS CAPSULE STUDY N/A 06/14/2019   Procedure: GIVENS CAPSULE STUDY;  Surgeon: Napoleon Form, MD;  Location: [facility] ENDOSCOPY;  Service: Endoscopy;  Laterality: N/A;   HEMOSTASIS CLIP PLACEMENT  11/12/2020   Procedure: HEMOSTASIS CLIP PLACEMENT;  Surgeon: Lemar Lofty., MD;  Location: [facility] ENDOSCOPY;  Service: Gastroenterology;;   HEMOSTASIS CONTROL  11/12/2020   Procedure: HEMOSTASIS CONTROL;  Surgeon: Lemar Lofty., MD;  Location: [facility] ENDOSCOPY;  Service: Gastroenterology;;   HOT HEMOSTASIS N/A 10/28/2018   Procedure: HOT HEMOSTASIS (ARGON PLASMA COAGULATION/BICAP);  Surgeon: Tressia Danas, MD;  Location: [facility] ENDOSCOPY;  Service: Gastroenterology;  Laterality: N/A;   HOT HEMOSTASIS N/A 11/12/2020   Procedure: HOT HEMOSTASIS (ARGON PLASMA COAGULATION/BICAP);  Surgeon:  Lemar Lofty., MD;  Location: [facility] ENDOSCOPY;  Service: Gastroenterology;  Laterality: N/A;   HOT HEMOSTASIS N/A 03/22/2022   Procedure: HOT HEMOSTASIS (ARGON PLASMA COAGULATION/BICAP);  Surgeon: Benancio Deeds, MD;  Location: [facility] ENDOSCOPY;  Service: Gastroenterology;  Laterality: N/A;   INCISION AND DRAINAGE PERIRECTAL ABSCESS N/A 06/05/2017   Procedure: IRRIGATION AND DEBRIDEMENT PERIRECTAL ABSCESS;  Surgeon: Andria Meuse, MD;  Location: [facility] OR;  Service: General;  Laterality: N/A;   LAPAROSCOPIC APPENDECTOMY N/A 05/01/2023   Procedure: APPENDECTOMY LAPAROSCOPIC;  Surgeon: Emelia Loron, MD;  Location: [facility] OR;  Service: General;  Laterality: N/A;   PERIPHERAL VASCULAR INTERVENTION  12/07/2022   Procedure: PERIPHERAL VASCULAR INTERVENTION;  Surgeon: Leonie Douglas, MD;  Location: [facility];  Service: Cardiovascular;;   SUBMUCOSAL TATTOO INJECTION  11/12/2020   Procedure: SUBMUCOSAL TATTOO INJECTION;  Surgeon: Lemar Lofty., MD;  Location: [facility] ENDOSCOPY;  Service: Gastroenterology;;   VIDEO BRONCHOSCOPY Bilateral 05/08/2016   Procedure: VIDEO BRONCHOSCOPY WITH FLUORO;  Surgeon: Oretha Milch, MD;  Location: [facility] ENDOSCOPY;  Service: Cardiopulmonary;  Laterality: Bilateral;     Social History:   reports that he has been smoking cigarettes. He has a 23.5 pack-year smoking history. He has never used smokeless tobacco. He reports that he does not currently use alcohol. He reports that he does not use drugs.   Family History:  His family history includes Cancer in his father; Colon cancer in his mother; Diabetes in his brother, mother, and sister; Heart disease in his mother; Liver cancer in his mother.   Allergies Allergies  Allergen Reactions   Lisinopril Cough     Home Medications  Prior to Admission medications   Medication Sig Start Date End Date Taking? Authorizing Provider  acetaminophen (TYLENOL) 500 MG tablet Take 2 tablets (1,000 mg total) by  mouth every 8 (eight) hours as needed for moderate pain. 01/03/23  Yes Newlin, Enobong,  MD  albuterol (VENTOLIN HFA) 108 (90 Base) MCG/ACT inhaler INHALE 2 PUFFS INTO THE LUNGS EVERY 6 (SIX) HOURS AS NEEDED FOR WHEEZING OR SHORTNESS OF BREATH. Patient taking differently: Inhale 2 puffs into the lungs daily as needed for wheezing or shortness of breath. 12/06/22  Yes [name], MD  aspirin EC 81 MG tablet Take 1 tablet (81 mg total) by mouth daily. Swallow whole. 12/07/22  Yes Leonie Douglas, MD  atorvastatin (LIPITOR) 80 MG tablet Take 1 tablet (80 mg total) by mouth daily. 02/04/23  Yes [name], MD  DULoxetine (CYMBALTA) 60 MG capsule Take 1 capsule (60 mg total) by mouth daily. For chronic leg pains 01/09/23  Yes [name], MD  ergocalciferol (DRISDOL) 1.25 MG (50000 UT) capsule Take 1 capsule (50,000 Units total) by mouth once a week. 02/26/23  Yes [name], MD  ezetimibe (ZETIA) 10 MG tablet Take 1 tablet (10 mg total) by mouth daily. 01/31/23  Yes Pricilla Riffle, MD  ferrous sulfate (FEROSUL) 325 (65 FE) MG tablet Take 1 tablet (325 mg total) by mouth daily with breakfast. 01/30/23  Yes Danis, Andreas Blower, MD  folic acid (FOLVITE) 1 MG tablet Take 1 tablet (1 mg total) by mouth daily. 01/30/23  Yes [name], MD  furosemide (LASIX) 40 MG tablet Take 1-2 tablets (40-80 mg total) by mouth 2 (two) times daily as needed.Take an extra pill with worsening pedal edema. 01/09/23  Yes [name], MD  gabapentin (NEURONTIN) 300 MG capsule Take 1 capsule (300 mg total) by mouth 2 (two) times daily. 02/25/23  Yes Kathryne Hitch, MD  hydrALAZINE (APRESOLINE) 50 MG tablet Take 1 tablet (50 mg total) by mouth every 8 (eight) hours. 02/04/23  Yes [name], MD  isosorbide mononitrate (IMDUR) 30 MG 24 hr tablet Take 1 tablet (30 mg total) by mouth daily. 01/30/23  Yes [name], MD  metoprolol succinate (TOPROL-XL) 50 MG 24 hr tablet Take 1 tablet (50 mg total) by  mouth daily. Take with or immediately following a meal. 02/04/23  Yes Newlin, Enobong, MD  nitroGLYCERIN (NITROSTAT) 0.4 MG SL tablet Place 1 tablet (0.4 mg total) under the tongue every 5 (five) minutes as needed for chest pain. 11/19/20 04/30/23 Yes Albertine Grates, MD  OXYGEN Inhale 2 L/min into the lungs continuous.   Yes [provider]  pantoprazole (PROTONIX) 40 MG tablet Take 1 tablet (40 mg total) by mouth daily. 02/04/23  Yes [name], MD  rivaroxaban (XARELTO) 20 MG TABS tablet Take 1 tablet (20 mg total) by mouth daily with supper. 10/25/22  Yes [name], MD  Accu-Chek Softclix Lancets lancets Use to check blood sugar once daily. 01/09/23   [name], MD  Blood Glucose Monitoring Suppl (ACCU-CHEK GUIDE) w/Device KIT Use to check blood sugar once daily. 01/30/23   [name], MD  Budeson-Glycopyrrol-Formoterol (BREZTRI AEROSPHERE) 160-9-4.8 MCG/ACT AERO Take 2 puffs first thing in morning and then another 2 puffs about 12 hours later. 02/25/23   Nyoka Cowden, MD  glucose blood (ACCU-CHEK GUIDE) test strip Use to check blood sugar once daily. 01/09/23   [name], MD  TUDORZA PRESSAIR 400 MCG/ACT AEPB INHALE 1 PUFF INTO THE LUNGS IN THE MORNING AND AT BEDTIME. 03/29/20 04/04/20  Nyoka Cowden, MD     Critical care time: NA      Simonne Martinet ACNP-BC [facility] Pulmonary/Critical Care Pager # [phone] OR # [phone] if no answer

## 2023-05-02 NOTE — Consult Note (Signed)
NEUROLOGY CONSULT NOTE   Date of service: May 02, 2023 Patient Name: Jared [name] MRN:  [id number] DOB:  [birth year] Chief Complaint: "SOB, headache, testicle pain, nasal congestion and headache" Requesting Provider: Ccs, Md, MD  History of Present Illness  Jared [name] is a 67 y.o. male  has a past medical history of AVM (arteriovenous malformation), CAD (coronary artery disease), CAP (community acquired pneumonia) (09/2013), Chronic combined systolic and diastolic CHF (congestive heart failure) (HCC), Chronic respiratory failure (HCC), Cluster headache, COPD (chronic obstructive pulmonary disease) (HCC), DM (diabetes mellitus) (HCC), History of CVA (cerebrovascular accident), Hypertension, IDA (iron deficiency anemia), Moderate tobacco use disorder, NICM (nonischemic cardiomyopathy) (HCC), Nicotine addiction, Tobacco abuse, and Type 2 diabetes mellitus (HCC) (05/14/2016). who presents with   multiple complaints  including headache, abdominal, testicular pain and admitted on 11/19. He also had associated decreased po intake with intermittent syncopal episodes over the last 2 years. Has seen multiple providers for workup. He was found to have acute perforated appendicitis with microperforation on CT A/P and was taken to the OR. Echo was obtained prior to surgery which revealed grade 2 dysfunction, moderate RV dysfunction, RV dilation and density in IVC concerning for thrombus. Neurology has been consulted for a left upper extremity tremor  Patient is awake and alert and oriented x 4.  He tells me that he has had tremors for at least the last 6 years and has been to multiple doctors in regards to his tremors and was never told what was causing the tremors.  He states that sometimes he does not remember the instances leading up to or during the tremors, but most times he is awake and aware of them.  He also states that sometimes he has loss of consciousness.  He states that these episodes of tremors  would occur about once a month.  He states that in the last 3 months the tremors have gotten worse, whereas now they have been occurring about 2-3 times a month.  He states he believes the tremors are from when he was at [place] [employer] in [id number] but nobody can confirm that. Today he was noted to have bilateral upper extremity tremors and was unable to hold the incentive spirometer.  Tremors spontaneously stopped, lasting about 20 seconds.  He was awake alert and oriented during the time of these tremors.  During exam and conversation he did not have any tremors noted  ROS  Comprehensive ROS performed and pertinent positives documented in HPI    Past History   Past Medical History:  Diagnosis Date   AVM (arteriovenous malformation)    CAD (coronary artery disease)    a. LHC 5/12:  LAD 20, pLCx 20, pRCA 40, dRCA 40, EF 35%, diff HK  //  b. Myoview 4/16: Overall Impression:  High risk stress nuclear study There is no evidence of ischemia.  There is severe LV dysfunction. LV Ejection Fraction: 30%.  LV Wall Motion:  There is global LV hypokinesis.     CAP (community acquired pneumonia) 09/2013   Chronic combined systolic and diastolic CHF (congestive heart failure) (HCC)    a. Echo 4/16:Mild LVH, EF 40-45%, diffuse HK //  b. Echo 8/17: EF 35-40%, diffuse HK, diastolic dysfunction, aortic sclerosis, trivial MR, moderate LAE, normal RVSF, moderate RAE, mild TR, PASP 42 mmHg // c. Echo 4/18: Mild concentric LVH, EF 30-35, normal wall motion, grade 1 diastolic dysfunction, PASP 49   Chronic respiratory failure (HCC)    Cluster headache    "  hx; haven't had one in awhile" (01/09/2016)   COPD (chronic obstructive pulmonary disease) (HCC)    [name] 01/09/2016   DM (diabetes mellitus) (HCC)    History of CVA (cerebrovascular accident)    Hypertension    IDA (iron deficiency anemia)    Moderate tobacco use disorder    NICM (nonischemic cardiomyopathy) (HCC)    Nicotine addiction    Tobacco abuse     Type 2 diabetes mellitus (HCC) 05/14/2016    Past Surgical History:  Procedure Laterality Date   ABDOMINAL AORTOGRAM W/LOWER EXTREMITY N/A 12/07/2022   Procedure: ABDOMINAL AORTOGRAM W/LOWER EXTREMITY;  Surgeon: Leonie Douglas, MD;  Location: [facility];  Service: Cardiovascular;  Laterality: N/A;   BIOPSY  11/12/2020   Procedure: BIOPSY;  Surgeon: Lemar Lofty., MD;  Location: [facility] ENDOSCOPY;  Service: Gastroenterology;;   CARDIAC CATHETERIZATION  10/2010   LM normal, LAD with 20% irregularities, LCX with 20%, RCA with 40% prox and 40% distal - EF of 35%   CATARACT EXTRACTION, BILATERAL     COLONOSCOPY W/ BIOPSIES AND POLYPECTOMY     COLONOSCOPY WITH PROPOFOL N/A 09/06/2018   Procedure: COLONOSCOPY WITH PROPOFOL;  Surgeon: Tressia Danas, MD;  Location: [facility] ENDOSCOPY;  Service: Gastroenterology;  Laterality: N/A;   ENTEROSCOPY N/A 09/28/2018   Procedure: ENTEROSCOPY;  Surgeon: Jeani Hawking, MD;  Location: [facility] ENDOSCOPY;  Service: Endoscopy;  Laterality: N/A;   ENTEROSCOPY N/A 10/28/2018   Procedure: ENTEROSCOPY;  Surgeon: Tressia Danas, MD;  Location: [facility] ENDOSCOPY;  Service: Gastroenterology;  Laterality: N/A;   ENTEROSCOPY N/A 10/09/2020   Procedure: ENTEROSCOPY;  Surgeon: Hilarie Fredrickson, MD;  Location: [facility] ENDOSCOPY;  Service: Endoscopy;  Laterality: N/A;   ENTEROSCOPY N/A 03/22/2022   Procedure: ENTEROSCOPY;  Surgeon: Benancio Deeds, MD;  Location: [facility] ENDOSCOPY;  Service: Gastroenterology;  Laterality: N/A;   ESOPHAGOGASTRODUODENOSCOPY N/A 11/12/2020   Procedure: ESOPHAGOGASTRODUODENOSCOPY (EGD);  Surgeon: Lemar Lofty., MD;  Location: [facility] ENDOSCOPY;  Service: Gastroenterology;  Laterality: N/A;   ESOPHAGOGASTRODUODENOSCOPY (EGD) WITH PROPOFOL N/A 09/05/2018   Procedure: ESOPHAGOGASTRODUODENOSCOPY (EGD) WITH PROPOFOL;  Surgeon: Benancio Deeds, MD;  Location: [facility] ENDOSCOPY;  Service: Gastroenterology;  Laterality: N/A;   ESOPHAGOGASTRODUODENOSCOPY (EGD)  WITH PROPOFOL N/A 11/19/2020   Procedure: ESOPHAGOGASTRODUODENOSCOPY (EGD) WITH PROPOFOL;  Surgeon: Beverley Fiedler, MD;  Location: [facility] ENDOSCOPY;  Service: Gastroenterology;  Laterality: N/A;   ESOPHAGOGASTRODUODENOSCOPY (EGD) WITH PROPOFOL N/A 12/27/2022   Procedure: ESOPHAGOGASTRODUODENOSCOPY (EGD) WITH PROPOFOL;  Surgeon: Napoleon Form, MD;  Location: [facility] ENDOSCOPY;  Service: Gastroenterology;  Laterality: N/A;   EXCISION MASS HEAD     GIVENS CAPSULE STUDY N/A 09/06/2018   Procedure: GIVENS CAPSULE STUDY;  Surgeon: Tressia Danas, MD;  Location: [facility] ENDOSCOPY;  Service: Gastroenterology;  Laterality: N/A;   GIVENS CAPSULE STUDY N/A 09/26/2018   Procedure: GIVENS CAPSULE STUDY;  Surgeon: Beverley Fiedler, MD;  Location: [facility] ENDOSCOPY;  Service: Gastroenterology;  Laterality: N/A;   GIVENS CAPSULE STUDY N/A 06/14/2019   Procedure: GIVENS CAPSULE STUDY;  Surgeon: Napoleon Form, MD;  Location: [facility] ENDOSCOPY;  Service: Endoscopy;  Laterality: N/A;   HEMOSTASIS CLIP PLACEMENT  11/12/2020   Procedure: HEMOSTASIS CLIP PLACEMENT;  Surgeon: Lemar Lofty., MD;  Location: [facility] ENDOSCOPY;  Service: Gastroenterology;;   HEMOSTASIS CONTROL  11/12/2020   Procedure: HEMOSTASIS CONTROL;  Surgeon: Lemar Lofty., MD;  Location: [facility] ENDOSCOPY;  Service: Gastroenterology;;   HOT HEMOSTASIS N/A 10/28/2018   Procedure: HOT HEMOSTASIS (ARGON PLASMA COAGULATION/BICAP);  Surgeon: Tressia Danas, MD;  Location: [facility] ENDOSCOPY;  Service: Gastroenterology;  Laterality: N/A;   HOT HEMOSTASIS N/A 11/12/2020   Procedure: HOT HEMOSTASIS (ARGON PLASMA COAGULATION/BICAP);  Surgeon: Lemar Lofty., MD;  Location: [facility] ENDOSCOPY;  Service: Gastroenterology;  Laterality: N/A;   HOT HEMOSTASIS N/A 03/22/2022   Procedure: HOT HEMOSTASIS (ARGON PLASMA COAGULATION/BICAP);  Surgeon: Benancio Deeds, MD;  Location: [facility] ENDOSCOPY;  Service: Gastroenterology;  Laterality: N/A;   INCISION AND DRAINAGE PERIRECTAL  ABSCESS N/A 06/05/2017   Procedure: IRRIGATION AND DEBRIDEMENT PERIRECTAL ABSCESS;  Surgeon: Andria Meuse, MD;  Location: [facility] OR;  Service: General;  Laterality: N/A;   LAPAROSCOPIC APPENDECTOMY N/A 05/01/2023   Procedure: APPENDECTOMY LAPAROSCOPIC;  Surgeon: Emelia Loron, MD;  Location: [facility] OR;  Service: General;  Laterality: N/A;   PERIPHERAL VASCULAR INTERVENTION  12/07/2022   Procedure: PERIPHERAL VASCULAR INTERVENTION;  Surgeon: Leonie Douglas, MD;  Location: [facility];  Service: Cardiovascular;;   SUBMUCOSAL TATTOO INJECTION  11/12/2020   Procedure: SUBMUCOSAL TATTOO INJECTION;  Surgeon: Lemar Lofty., MD;  Location: [facility] ENDOSCOPY;  Service: Gastroenterology;;   VIDEO BRONCHOSCOPY Bilateral 05/08/2016   Procedure: VIDEO BRONCHOSCOPY WITH FLUORO;  Surgeon: Oretha Milch, MD;  Location: [facility] ENDOSCOPY;  Service: Cardiopulmonary;  Laterality: Bilateral;    Family History: Family History  Problem Relation Age of Onset   Heart disease Mother    Diabetes Mother    Colon cancer Mother    Liver cancer Mother    Cancer Father        type unknown   Diabetes Sister        x 2   Diabetes Brother     Social History  reports that he has been smoking cigarettes. He has a 23.5 pack-year smoking history. He has never used smokeless tobacco. He reports that he does not currently use alcohol. He reports that he does not use drugs.  Allergies  Allergen Reactions   Lisinopril Cough    Medications   Current Facility-Administered Medications:    acetaminophen (TYLENOL) tablet 1,000 mg, 1,000 mg, Oral, Q6H, Eric Form, PA-C, 1,000 mg at 05/02/23 0527   albuterol (PROVENTIL) (2.5 MG/3ML) 0.083% nebulizer solution 2.5 mg, 2.5 mg, Nebulization, Q2H PRN, Jonah Blue, MD   arformoterol [facility]) nebulizer solution 15 mcg, 15 mcg, Nebulization, BID, Simonne Martinet, NP   aspirin EC tablet 81 mg, 81 mg, Oral, Daily, Eric Form, PA-C, 81 mg at 05/02/23  7829   atorvastatin (LIPITOR) tablet 80 mg, 80 mg, Oral, Daily, Eric Form, PA-C, 80 mg at 05/02/23 5621   bisacodyl (DULCOLAX) suppository 10 mg, 10 mg, Rectal, Daily PRN, Anthoney Harada, NP   budesonide (PULMICORT) nebulizer solution 0.5 mg, 0.5 mg, Nebulization, BID, Simonne Martinet, NP   DULoxetine (CYMBALTA) DR capsule 60 mg, 60 mg, Oral, Daily, Eric Form, PA-C, 60 mg at 05/02/23 3086   ezetimibe (ZETIA) tablet 10 mg, 10 mg, Oral, Daily, Eric Form, PA-C, 10 mg at 05/02/23 5784   feeding supplement (BOOST / RESOURCE BREEZE) liquid 1 Container, 1 Container, Oral, TID BM, Emelia Loron, MD, 1 Container at 05/02/23 6962   ferrous sulfate tablet 325 mg, 325 mg, Oral, Q breakfast, Eric Form, PA-C, 325 mg at 05/02/23 9528   folic acid (FOLVITE) tablet 1 mg, 1 mg, Oral, Daily, Eric Form, PA-C, 1 mg at 05/02/23 4132   gabapentin (NEURONTIN) capsule 300 mg, 300 mg, Oral, BID, Eric Form, PA-C, 300 mg at 05/02/23 0928   heparin ADULT infusion 100 units/mL (25000 units/240mL),  1,600 Units/hr, Intravenous, Continuous, [place], [place], Last Rate: 16 mL/hr at 05/02/23 1549, 1,600 Units/hr at 05/02/23 1549   hydrALAZINE (APRESOLINE) tablet 50 mg, 50 mg, Oral, Q8H, Eric Form, PA-C, 50 mg at 05/01/23 0516   isosorbide mononitrate (IMDUR) 24 hr tablet 30 mg, 30 mg, Oral, Daily, Eric Form, PA-C, 30 mg at 05/02/23 1610   lactated ringers infusion, , Intravenous, Continuous, Jonah Blue, MD, Last Rate: 75 mL/hr at 05/02/23 1352, New Bag at 05/02/23 1352   metoprolol succinate (TOPROL-XL) 24 hr tablet 50 mg, 50 mg, Oral, Daily, Eric Form, PA-C, 50 mg at 05/01/23 1038   morphine (PF) 2 MG/ML injection 1-2 mg, 1-2 mg, Intravenous, Q3H PRN, Eric Form, PA-C   multivitamin with minerals tablet 1 tablet, 1 tablet, Oral, Daily, Eric Form, PA-C, 1 tablet at 05/02/23 9604   nicotine (NICODERM CQ - dosed in mg/24 hours)  patch 21 mg, 21 mg, Transdermal, Daily, Eric Form, PA-C, 21 mg at 05/02/23 0935   oxyCODONE (Oxy IR/ROXICODONE) immediate release tablet 5-10 mg, 5-10 mg, Oral, Q4H PRN, Eric Form, PA-C, 10 mg at 05/02/23 0527   pantoprazole (PROTONIX) EC tablet 40 mg, 40 mg, Oral, Daily, Eric Form, PA-C, 40 mg at 05/02/23 0929   piperacillin-tazobactam (ZOSYN) IVPB 3.375 g, 3.375 g, Intravenous, Q8H, Eric Form, PA-C, Last Rate: 12.5 mL/hr at 05/02/23 1353, 3.375 g at 05/02/23 1353   revefenacin (YUPELRI) nebulizer solution 175 mcg, 175 mcg, Nebulization, Daily, Simonne Martinet, NP  Vitals   Vitals:   05/02/23 0828 05/02/23 0930 05/02/23 1144 05/02/23 1355  BP: 100/73 107/69 108/75 101/73  Pulse: 99 99 100   Resp: 18  18   Temp: 97.9 F (36.6 C)  98.3 F (36.8 C)   TempSrc: Oral  Oral   SpO2: 94%  93%   Weight:      Height:        Body mass index is 27.37 kg/m.  Physical Exam   Constitutional: Appears well-developed and well-nourished.   Psych: Affect appropriate to situation.   Eyes: No scleral injection.   HENT: No OP obstruction.   Head: Normocephalic.   Cardiovascular: Normal rate and regular rhythm.   Respiratory: Effort normal, non-labored breathing.   GI: Soft.  No distension. There is no tenderness.   Skin: WDI.    Neurologic Examination   Mental Status -  Level of arousal and orientation to time, place, and person were intact. Language including expression, naming, repetition, comprehension was assessed and found intact. Attention span and concentration were normal. Recent and remote memory were intact. Fund of Knowledge was assessed and was intact.  Cranial Nerves II - XII - II - Visual field intact OU. III, IV, VI - Extraocular movements intact. V - Facial sensation intact bilaterally. VII - Facial movement intact bilaterally. VIII - Hearing & vestibular intact bilaterally. X - Palate elevates symmetrically. XI - Chin turning & shoulder  shrug intact bilaterally. XII - Tongue protrusion intact.  Motor Strength - The patient's strength was normal in bilateral upper extremity, bilateral lowers are limited due to pain but can slightly lift off the bed  Bulk was normal and fasciculations were absent.   Motor Tone - Muscle tone was assessed at the neck and appendages and was normal. Sensory - Light touch, temperature/pinprick were assessed and were symmetrical.   Coordination - The patient had normal movements in the hands and feet with no ataxia or dysmetria.  Tremor  was absent. Gait and Station - deferred.  Labs/Imaging/Neurodiagnostic studies   CBC:  Recent Labs  Lab 05/05/2023 0749 05/02/23 0755  WBC 11.0* 10.2  HGB 8.8* 9.6*  HCT 30.1* 33.2*  MCV 89.6 91.0  PLT 246 355    Basic Metabolic Panel:  Lab Results  Component Value Date   NA 135 05/02/2023   K 4.3 05/02/2023   CO2 30 05/02/2023   GLUCOSE 193 (H) 05/02/2023   BUN 14 05/02/2023   CREATININE 1.61 (H) 05/02/2023   CALCIUM 8.4 (L) 05/02/2023   GFRNONAA 46 (L) 05/02/2023   GFRAA 91 01/29/2020    Lipid Panel:  Lab Results  Component Value Date   LDLCALC 62 10/25/2022    HgbA1c:  Lab Results  Component Value Date   HGBA1C 5.0 10/25/2022    Urine Drug Screen:     Component Value Date/Time   LABOPIA POSITIVE (A) 04/30/2023 2155   COCAINSCRNUR NONE DETECTED 04/30/2023 2155   LABBENZ NONE DETECTED 04/30/2023 2155   AMPHETMU NONE DETECTED 04/30/2023 2155   THCU NONE DETECTED 04/30/2023 2155   LABBARB NONE DETECTED 04/30/2023 2155     Alcohol Level     Component Value Date/Time   ETH 70 (H) 07/25/2016 1720    INR  Lab Results  Component Value Date   INR 1.7 (H) 04/30/2023    APTT  Lab Results  Component Value Date   APTT 45 (H) 04/30/2023    AED levels: No results found for: "PHENYTOIN", "ZONISAMIDE", "LAMOTRIGINE", "LEVETIRACETA"    CT Head without contrast 11/19: No acute process    ASSESSMENT   Jared [name] is a 67  y.o. male  has a past medical history of AVM (arteriovenous malformation), CAD (coronary artery disease), CAP (community acquired pneumonia) (09/2013), Chronic combined systolic and diastolic CHF (congestive heart failure) (HCC), Chronic respiratory failure (HCC), Cluster headache, COPD (chronic obstructive pulmonary disease) (HCC), DM (diabetes mellitus) (HCC), History of CVA (cerebrovascular accident), Hypertension, IDA (iron deficiency anemia), Moderate tobacco use disorder, NICM (nonischemic cardiomyopathy) (HCC), Nicotine addiction, Tobacco abuse, and Type 2 diabetes mellitus (HCC) (05/14/2016). Who was admitted on 11/19 for sepsis acute perforated appendicitis  with microperforation.  Neurology has been consulted for the evaluation of tremor and there is some concern for possible seizure and therefore we will get continuous EEG.  RECOMMENDATIONS   LTM EEG overnight ______________________________________________________________________   Lockie Mola [facility]   I have seen the patient reviewed the above note.  He has had tremors for very prolonged time.  He describes them as bilateral in nature, initially with preserved consciousness, but then he often does syncopized.  He is not sure how long he was out for.  He states that he will tremor for a minute or two before passing out.  He does not appear to be associated with whether he is standing or laying.  Bilateral tremors with preserved consciousness or unusual manifestations of seizure, but given how frequently these are happening to him, I do think a definitive diagnosis would be prudent and continuous EEG would be warranted.  Another consideration would be orthostatic tremor, associated with drops in blood pressure.  He does have cardiomyopathy, and is possible that his blood pressure is getting low.  If we are unable to capture a spell, then I would favor potentially ambulatory EEG as a next step in  evaluation.  Ritta Slot, MD [facility] [phone]  If 7pm- 7am, please page neurology on call as listed in AMION.

## 2023-05-02 NOTE — Progress Notes (Signed)
1 Day Post-Op   Subjective/Chief Complaint: No tlatus, voiding fine, discussed surgery, pain controlled   Objective: Vital signs in last 24 hours: Temp:  [97.8 F (36.6 C)-98.9 F (37.2 C)] 97.9 F (36.6 C) (11/21 0828) Pulse Rate:  [85-110] 99 (11/21 0828) Resp:  [16-20] 18 (11/21 0828) BP: (93-119)/(54-77) 100/73 (11/21 0828) SpO2:  [90 %-100 %] 94 % (11/21 0828) Weight:  [99.3 kg] 99.3 kg (11/20 1334) Last BM Date : 04/25/23  Intake/Output from previous day: 11/20 0701 - 11/21 0700 In: 1777 [P.O.:1077; I.V.:700] Out: 770 [Urine:400; Drains:320; Blood:50] Intake/Output this shift: No intake/output data recorded.  General nad Cv regular Pulm effort normal, on oxygen Ab approp tender, mildly distended, drain serosang  Lab Results:  Recent Labs    04/30/23 1027 05/01/23 0749  WBC 13.4* 11.0*  HGB 10.1* 8.8*  HCT 34.6* 30.1*  PLT 306 246   BMET Recent Labs    04/30/23 1027 05/01/23 0749  NA 135 135  K 3.6 3.3*  CL 98 96*  CO2 27 29  GLUCOSE 129* 98  BUN 12 8  CREATININE 1.65* 0.97  CALCIUM 8.5* 8.2*   PT/INR Recent Labs    04/30/23 1144  LABPROT 20.4*  INR 1.7*   ABG No results for input(s): "PHART", "HCO3" in the last 72 hours.  Invalid input(s): "PCO2", "PO2"  Studies/Results: DG Chest Port 1 View  Result Date: 04/30/2023 CLINICAL DATA:  Shortness of breath and headache EXAM: PORTABLE CHEST 1 VIEW COMPARISON:  12/26/2022 FINDINGS: Numerous leads and wires project over the chest. Remote left rib fractures. Midline trachea. Borderline cardiomegaly. Atherosclerosis in the transverse aorta. Left costophrenic angle excluded. No pleural effusion or pneumothorax. Chronic interstitial thickening/coarsening there is likely related to COPD/chronic bronchitis given clinical history of smoking. No lobar consolidation. IMPRESSION: No acute cardiopulmonary disease. Peribronchial thickening which may relate to chronic bronchitis or smoking. Aortic  Atherosclerosis (ICD10-I70.0). Electronically Signed   By: Jeronimo Greaves M.D.   On: 04/30/2023 16:55   CT ABDOMEN PELVIS W CONTRAST  Result Date: 04/30/2023 CLINICAL DATA:  Right lower quadrant pain, no bowel movement since last Thursday, testicular pain EXAM: CT ABDOMEN AND PELVIS WITH CONTRAST TECHNIQUE: Multidetector CT imaging of the abdomen and pelvis was performed using the standard protocol following bolus administration of intravenous contrast. RADIATION DOSE REDUCTION: This exam was performed according to the departmental dose-optimization program which includes automated exposure control, adjustment of the mA and/or kV according to patient size and/or use of iterative reconstruction technique. CONTRAST:  60mL OMNIPAQUE IOHEXOL 350 MG/ML SOLN COMPARISON:  11/18/2020 FINDINGS: Lower chest: Emphysema. No acute pleural or parenchymal lung disease. Hepatobiliary: No focal liver abnormality is seen. No gallstones, gallbladder wall thickening, or biliary dilatation. Pancreas: Unremarkable. No pancreatic ductal dilatation or surrounding inflammatory changes. Spleen: Normal in size without focal abnormality. Adrenals/Urinary Tract: Adrenal glands are unremarkable. Kidneys are normal, without renal calculi, focal lesion, or hydronephrosis. Bladder is unremarkable. Stomach/Bowel: There is a dilated inflamed appendix within the right lower quadrant, measuring up to 16 mm in diameter. And appendicolith is seen at the appendiceal orifice. Marked periappendiceal fat stranding with evidence of micro perforation at the base of the appendix. No fluid collection or abscess. No bowel obstruction or ileus. Vascular/Lymphatic: Subcentimeter right lower quadrant mesenteric lymph nodes are likely reactive. No pathologic adenopathy. Atherosclerosis of the aorta and its branches. Reproductive: Prostate is unremarkable. Other: Mesenteric edema and trace free fluid within the right lower quadrant. Punctate extraluminal gas  adjacent to the base of the appendix  consistent with micro perforation. No other evidence of pneumoperitoneum. No abdominal wall hernia. Musculoskeletal: No acute or destructive bony abnormalities. Reconstructed images demonstrate no additional findings. IMPRESSION: 1. Acute perforated appendicitis.  No fluid collection or abscess. 2.  Aortic Atherosclerosis (ICD10-I70.0). Critical Value/emergent results were called by telephone at the time of interpretation on 04/30/2023 at 4:29 pm to provider [facility], who verbally acknowledged these results. Electronically Signed   By: Sharlet Salina M.D.   On: 04/30/2023 16:32   US SCROTUM W/DOPPLER  Result Date: 04/30/2023 CLINICAL DATA:  Left testicular pain EXAM: SCROTAL ULTRASOUND DOPPLER ULTRASOUND OF THE TESTICLES TECHNIQUE: Complete ultrasound examination of the testicles, epididymis, and other scrotal structures was performed. Color and spectral Doppler ultrasound were also utilized to evaluate blood flow to the testicles. COMPARISON:  11/18/2020 FINDINGS: Right testicle Measurements: 2.5 x 4.0 x 2.2 cm. No mass or microlithiasis visualized. Left testicle Measurements: 2.1 x 3.9 x 1.7 cm. No mass or microlithiasis visualized. Right epididymis: Incidental 3 mm epididymal cyst of doubtful clinical significance. Otherwise normal size and appearance. Left epididymis: Incidental 2 mm epididymal cyst of doubtful clinical significance. Otherwise normal size and appearance. Hydrocele:  None visualized. Varicocele:  None visualized. Pulsed Doppler interrogation of both testes demonstrates normal low resistance arterial and venous waveforms bilaterally. IMPRESSION: 1. Unremarkable testicular ultrasound. 2. Small bilateral epididymal cysts of likely no clinical significance. Electronically Signed   By: Sharlet Salina M.D.   On: 04/30/2023 16:11   CT Head Wo Contrast  Result Date: 04/30/2023 CLINICAL DATA:  frontal HA, sepsis EXAM: CT HEAD WITHOUT CONTRAST TECHNIQUE:  Contiguous axial images were obtained from the base of the skull through the vertex without intravenous contrast. RADIATION DOSE REDUCTION: This exam was performed according to the departmental dose-optimization program which includes automated exposure control, adjustment of the mA and/or kV according to patient size and/or use of iterative reconstruction technique. COMPARISON:  None Available. FINDINGS: Brain: No hemorrhage. No hydrocephalus. No extra-axial fluid collection. No CT evidence of an acute cortical infarct. No mass effect. No mass lesion. Mineralization of the basal ganglia bilaterally. Vascular: No hyperdense vessel or unexpected calcification. Skull: Normal. Negative for fracture or focal lesion. Sinuses/Orbits: No middle ear effusion. There is a trace left mastoid effusion. Paranasal sinuses are clear. Bilateral lens replacement. Orbits are otherwise unremarkable. Other: None. IMPRESSION: 1. No CT finding to explain frontal headache. 2. Trace left mastoid effusion. Electronically Signed   By: Lorenza Cambridge M.D.   On: 04/30/2023 14:44    Anti-infectives: Anti-infectives (From admission, onward)    Start     Dose/Rate Route Frequency Ordered Stop   04/30/23 1930  piperacillin-tazobactam (ZOSYN) IVPB 3.375 g        3.375 g 12.5 mL/hr over 240 Minutes Intravenous Every 8 hours 04/30/23 1915     04/30/23 1145  piperacillin-tazobactam (ZOSYN) IVPB 3.375 g        3.375 g 100 mL/hr over 30 Minutes Intravenous  Once 04/30/23 1131 04/30/23 1242       Assessment/Plan: POD 1 lap appy for perforated appendicitis- MW - WBC pending today - remainder of labs pending also -cont ivf, will continue clears but high risk ileus so if any n/v may need ng/npo -oob/pulm toilet -five days abx postop -drain can likely come out prior to dc   FEN: clear ID: zosyn 1/5 VTE: hold xarelto. SCDs. Pharm dvt proph only  Appeciate TRH assistance Syncope - echo pending  COPD/Chronic resp failure - on 2 lpm  at baseline HTN  Atrial flutter - xarelto H/o CVA PVD AKI Tobacco use  Jared [name] 05/02/2023

## 2023-05-02 NOTE — TOC Initial Note (Signed)
Transition of Care (TOC) - Initial/Assessment Note   Spoke to patient at bedside.   Confirmed face sheet information.   Patient lives at a boarding house with roommates   Sister lives close by and checks on him daily.   Sister can provide transportation home at discharge . Patient has walker, cane, bedside commode and shower chair.   Patient also has home oxygen with [employer] and has portable oxygen DME  Patient Details  Name: Jared [name] MRN: [id number] Date of Birth: 26-Feb-1956  Transition of Care [facility]) CM/SW Contact:    Kingsley Plan, RN Phone Number: 05/02/2023, 1:36 PM  Clinical Narrative:                   Expected Discharge Plan: Home/Self Care Barriers to Discharge: Continued Medical Work up   Patient Goals and CMS Choice Patient states their goals for this hospitalization and ongoing recovery are:: to return to home          Expected Discharge Plan and Services   Discharge Planning Services: CM Consult Post Acute Care Choice: NA Living arrangements for the past 2 months: Boarding House                 DME Arranged: N/A         HH Arranged: NA          Prior Living Arrangements/Services Living arrangements for the past 2 months: [employer] Lives with:: Roommate Patient language and need for interpreter reviewed:: Yes Do you feel safe going back to the place where you live?: Yes      Need for Family Participation in Patient Care: Yes (Comment) Care giver support system in place?: Yes (comment) Current home services: DME Criminal Activity/Legal Involvement Pertinent to Current Situation/Hospitalization: No - Comment as needed  Activities of Daily Living   ADL Screening (condition at time of admission) Independently performs ADLs?: Yes (appropriate for developmental age) Is the patient deaf or have difficulty hearing?: No Does the patient have difficulty seeing, even when wearing glasses/contacts?: No Does the patient have  difficulty concentrating, remembering, or making decisions?: No  Permission Sought/Granted   Permission granted to share information with : No              Emotional Assessment Appearance:: Appears stated age Attitude/Demeanor/Rapport: Engaged Affect (typically observed): Accepting Orientation: : Oriented to Self, Oriented to Place, Oriented to  Time, Oriented to Situation Alcohol / Substance Use: Not Applicable Psych Involvement: No (comment)  Admission diagnosis:  Acute perforated appendicitis [K35.32] Syncope, unspecified syncope type [R55] Acute appendicitis with perforation and localized peritonitis, without abscess or gangrene [K35.32] Sepsis with acute renal failure without septic shock, due to unspecified organism, unspecified acute renal failure type (HCC) [A41.9, R65.20, N17.9] Patient Active Problem List   Diagnosis Date Noted   Malnutrition of moderate degree 05/01/2023   Sepsis (HCC) 04/30/2023   Acute perforated appendicitis 04/30/2023   History of CVA (cerebrovascular accident) 04/30/2023   Atrial fibrillation (HCC) 12/27/2022   Atherosclerosis of aorta (HCC) 10/25/2022   Peripheral vascular disease (HCC) 10/25/2022   Weakness 03/21/2022   Syncope and collapse 02/07/2021   History of GI bleed    Gastric ulcer due to Helicobacter pylori and nonsteroidal anti-inflammatory drug (NSAID)    AKI (acute kidney injury) (HCC) 11/18/2020   Epigastric pain    Syncope 06/11/2019   Chronic right maxillary sinusitis 01/29/2019   Chronic respiratory failure with hypoxia and hypercapnia (HCC) 01/01/2019   Upper GI bleed 10/27/2018  Anticoagulated 09/27/2018   AVM (arteriovenous malformation) of small bowel, acquired    Iron deficiency anemia due to chronic blood loss    Symptomatic anemia 09/04/2018   Tobacco use 09/04/2018   Noncompliance 06/14/2017   Elevated LFTs 08/13/2016   Chronic respiratory failure with hypoxia (HCC) 07/24/2016   Atrial flutter (HCC)     Hepatic congestion 07/02/2016   COPD with acute exacerbation (HCC) 06/03/2016   Type 2 diabetes mellitus (HCC) 05/14/2016   COPD GOLD III/still smoking  02/29/2016   Cigarette smoker 01/09/2016   CAD (coronary artery disease) 10/07/2013   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 [place] [address] Phone: [phone] Fax: [phone]  [facility] - [place], [place] - [address] [place] [address] Phone: [phone] Fax: [phone]  [facility] [address] [place] [address] Phone: [phone] Fax: [phone]  [id number] [place], [place] - [address] AT [facility] [address] [place] [address] Phone: [phone] Fax: [phone]     Social Determinants of Health (SDOH) Social History: SDOH Screenings   Food Insecurity: No Food Insecurity (04/30/2023)  Housing: Low Risk  (04/30/2023)  Transportation Needs: No Transportation Needs (04/30/2023)  Utilities: Not At Risk (04/30/2023)  Alcohol Screen: Low Risk  (02/06/2023)  Depression (PHQ2-9): Low Risk  (02/04/2023)  Recent Concern: Depression (PHQ2-9) - High Risk (01/09/2023)  Financial Resource Strain: Medium Risk (02/06/2023)  Physical Activity: Insufficiently Active (02/06/2023)  Social Connections: Moderately Integrated (02/06/2023)  Stress: No Stress Concern Present (02/06/2023)  Tobacco Use: High Risk (05/01/2023)  Health Literacy: Adequate Health Literacy (02/06/2023)   SDOH Interventions:     Readmission Risk Interventions    12/27/2022    2:20 PM 12/14/2021    1:38 PM 02/01/2021    3:22 PM  Readmission Risk Prevention Plan  Post Dischage Appt Complete  Complete   Medication Screening Complete Complete   Transportation Screening Complete Complete Complete  PCP or Specialist Appt within 5-7 Days   Complete  Home Care Screening   Complete  Medication Review (RN CM)   Complete

## 2023-05-02 NOTE — Progress Notes (Signed)
   05/02/23 0442  Provider Notification  Provider Name/Title Liana Crocker  Date Provider Notified 05/02/23  Time Provider Notified [phone]  Method of Notification Page (secure chat)  Notification Reason Other (Comment) (Patient needs something for constipation  no BM for one week,also patient is DM2  and no order for ACHS,his blood sugar at 0418 is 227,provider informed. A Chavez,NP)  Provider response See new orders  Date of Provider Response 05/02/23  Time of Provider Response [phone]

## 2023-05-03 ENCOUNTER — Inpatient Hospital Stay (HOSPITAL_COMMUNITY): Payer: Medicare HMO

## 2023-05-03 DIAGNOSIS — R569 Unspecified convulsions: Secondary | ICD-10-CM | POA: Diagnosis not present

## 2023-05-03 DIAGNOSIS — M7989 Other specified soft tissue disorders: Secondary | ICD-10-CM | POA: Diagnosis not present

## 2023-05-03 DIAGNOSIS — K35201 Acute appendicitis with generalized peritonitis, with perforation, without abscess: Secondary | ICD-10-CM | POA: Diagnosis not present

## 2023-05-03 DIAGNOSIS — R278 Other lack of coordination: Secondary | ICD-10-CM

## 2023-05-03 DIAGNOSIS — R251 Tremor, unspecified: Secondary | ICD-10-CM | POA: Diagnosis not present

## 2023-05-03 DIAGNOSIS — I480 Paroxysmal atrial fibrillation: Secondary | ICD-10-CM | POA: Diagnosis not present

## 2023-05-03 LAB — HEPATIC FUNCTION PANEL
ALT: 16 U/L (ref 0–44)
AST: 36 U/L (ref 15–41)
Albumin: 2.4 g/dL — ABNORMAL LOW (ref 3.5–5.0)
Alkaline Phosphatase: 100 U/L (ref 38–126)
Bilirubin, Direct: 0.2 mg/dL (ref 0.0–0.2)
Indirect Bilirubin: 0.3 mg/dL (ref 0.3–0.9)
Total Bilirubin: 0.5 mg/dL (ref <1.2)
Total Protein: 6.1 g/dL — ABNORMAL LOW (ref 6.5–8.1)

## 2023-05-03 LAB — AMMONIA: Ammonia: 29 umol/L (ref 9–35)

## 2023-05-03 LAB — GLUCOSE, CAPILLARY
Glucose-Capillary: 145 mg/dL — ABNORMAL HIGH (ref 70–99)
Glucose-Capillary: 129 mg/dL — ABNORMAL HIGH (ref 70–99)
Glucose-Capillary: 141 mg/dL — ABNORMAL HIGH (ref 70–99)
Glucose-Capillary: 201 mg/dL — ABNORMAL HIGH (ref 70–99)

## 2023-05-03 LAB — SODIUM, URINE, RANDOM: Sodium, Ur: <10 mmol/L

## 2023-05-03 LAB — BASIC METABOLIC PANEL
Anion gap: 6 (ref 5–15)
BUN: 23 mg/dL (ref 8–23)
CO2: 31 mmol/L (ref 22–32)
Calcium: 8.1 mg/dL — ABNORMAL LOW (ref 8.9–10.3)
Chloride: 94 mmol/L — ABNORMAL LOW (ref 98–111)
Creatinine, Ser: 1.93 mg/dL — ABNORMAL HIGH (ref 0.61–1.24)
GFR, Estimated: 37 mL/min — ABNORMAL LOW (ref >60)
Glucose, Bld: 148 mg/dL — ABNORMAL HIGH (ref 70–99)
Potassium: 4.2 mmol/L (ref 3.5–5.1)
Sodium: 131 mmol/L — ABNORMAL LOW (ref 135–145)

## 2023-05-03 LAB — CBC
HCT: 30.5 % — ABNORMAL LOW (ref 39.0–52.0)
Hemoglobin: 8.8 g/dL — ABNORMAL LOW (ref 13.0–17.0)
MCH: 26.3 pg (ref 26.0–34.0)
MCHC: 28.9 g/dL — ABNORMAL LOW (ref 30.0–36.0)
MCV: 91 fL (ref 80.0–100.0)
Platelets: 359 10*3/uL (ref 150–400)
RBC: 3.35 MIL/uL — ABNORMAL LOW (ref 4.22–5.81)
RDW: 16.2 % — ABNORMAL HIGH (ref 11.5–15.5)
WBC: 12.9 10*3/uL — ABNORMAL HIGH (ref 4.0–10.5)
nRBC: 0 % (ref 0.0–0.2)

## 2023-05-03 LAB — OSMOLALITY, URINE: Osmolality, Ur: 549 mosm/kg (ref 300–900)

## 2023-05-03 LAB — SURGICAL PATHOLOGY

## 2023-05-03 LAB — HEPARIN LEVEL (UNFRACTIONATED): Heparin Unfractionated: 0.62 [IU]/mL (ref 0.30–0.70)

## 2023-05-03 LAB — MAGNESIUM: Magnesium: 2.2 mg/dL (ref 1.7–2.4)

## 2023-05-03 LAB — LACTIC ACID, PLASMA: Lactic Acid, Venous: 1.1 mmol/L (ref 0.5–1.9)

## 2023-05-03 MED ORDER — LACTATED RINGERS IV SOLN: INTRAVENOUS | Status: AC

## 2023-05-03 NOTE — Progress Notes (Signed)
Progress Note   Patient: Jared [name] JXB:[id number] DOB: 26-Nov-1955 DOA: 04/30/2023     3 DOS: the patient was seen and examined on 05/03/2023   Brief hospital course: 67yo with h/o chronic combined CHF, HTN, CVA, afib on Xarelto, PVD, and COPD on 2L home O2 who presented on 11/19 with SOB and other complaints.  Fever 100.9, WBC 13.4, lactate 2.9, Creatinine 1.65 with AKI.  CT A?P with acute perforated appendicitis with microperforation.  Admitted for IV antibiotics, Xarelto washout.  Surgery is consulting.  Assessment and Plan:  Sepsis due to acute perforated appendicitis with microperforation Surgery consulted and recommended holding Xarelto and keeping n.p.o. except for sips with meds Underwent lap appy on 11/20 - visible appendiceal perforation noted with abscess that was drained and fecaliths removed; surgical drain was left in place Continue IV Zosyn Blood cultures pending Surgery assumed care post-operatively but given complications post-operatively he will return to the [facility] service Abdomen remains sore and distended with drain in LLQ There is some concern today for ileus vs. Abscess so surgery is continuing to closely follow   AKI (acute kidney injury)  Prerenal AKI on presentation with elevated creatinine of 1.69 from prior of 1 Resolved with IVF Recurred post-operatively on 11/21 and so IVF resumed Worsening AKI today Continue IVF Will consult nephrology   Syncope and collapse Patient reports 2 years of intermittent generalized body shaking with syncope and loss of consciousness that are often positional when going from sitting to standing. Last echocardiogram was 12/2021 with EF of 45 to 50% and global hypokinesis, grade 1 diastolic dysfunction.  No significant valvular abnormality. Repeat echo shows preserved EF, grade 2 diastolic dysfunction with moderate reduction in RV function and RV dilatation and a vague density in the IVC, concerning for possible PE/IVC thrombus STAT  VQ scan negative for PE  Abdominal US ordered to rule out IVC thrombus but was non-diagnostic due to body habitus and air in abdominal cavity DVT US negative Cardiology consulted Will continue to treat empirically with Heparin for now  Tremors LTM EEG with non-epileptic spells Neurology consulted   IDA Patient with h/o iron deficiency anemia Receives weekly IV iron infusions, due today Hgb 8.8 on presentation, down from 10/6 on 11/1 Given IV iron dose on 11/21 as per patient request Hgb improved to 9.6 on 11/22 post-iron   Chronic respiratory failure with hypoxia  Secondary to COPD on 2L Stable although with wheezing on exam at time of admission (none this AM) PRN duoneb, Albuterol Continue Breztri Nurse reported wheezing this AM but he appeared more comfortable at the time of my evaluation   Essential hypertension Continue hydralazine, Imdur, metoprolol Holding Lasix due to recurrent AKI   HLD Continue Zetia, atorvastatin   Atrial flutter  Continue Toprol XL Holding Xarelto due to surgery Starting Heparin infusion on 11/21  Chronic diastolic CHF Echo 11/21 with preserved EF and grade 2 diastolic dysfunction Concern for IVC thrombus   History of CVA (cerebrovascular accident) Continue aspirin Xarelto currently on hold    Peripheral vascular disease  Continue aspirin, statin   Depression Continue Cymbalta, gabapentin   Tobacco use Ongoing use of close to 1 pack/day Nicotine patch offered  Malnutrition Nutrition Problem: Moderate Malnutrition Etiology: acute illness Signs/Symptoms: energy intake < 75% for > 7 days, mild muscle depletion, mild fat depletion Interventions: MVI         Consultants: Surgery Cardiology Neurology PCCM Nutrition   Procedures: Appendectomy 11/20   Antibiotics: Zosyn 11/19-    30  Day Unplanned Readmission Risk Score    Flowsheet Row ED to Hosp-Admission (Current) from 04/30/2023 in [facility]  SURGICAL  30 Day Unplanned Readmission Risk Score (%) 20.86 Filed at 05/03/2023 0801       This score is the patient's risk of an unplanned readmission within 30 days of being discharged (0 -100%). The score is based on dignosis, age, lab data, medications, orders, and past utilization.   Low:  0-14.9   Medium: 15-21.9   High: 22-29.9   Extreme: 30 and above           Subjective: He looks much better today and is much more interactive.  Continuing to have abdominal pain and distention as well as worsening AKI.   Objective: Vitals:   05/03/23 0714 05/03/23 0928  BP: 114/67 109/72  Pulse: 100 100  Resp: 16   Temp: 99.1 F (37.3 C)   SpO2: 93%     Intake/Output Summary (Last 24 hours) at 05/03/2023 1510 Last data filed at 05/03/2023 0530 Gross per 24 hour  Intake 627.29 ml  Output 285 ml  Net 342.29 ml   Filed Weights   04/30/23 2314 05/01/23 1334  Weight: 99.4 kg 99.3 kg    Exam:  General:  Appears calm and comfortable and is in NAD, on 2L [place] O2 Eyes:   EOMI, normal lids, iris ENT:  grossly normal hearing, lips & tongue, mmm Neck:  no LAD, masses or thyromegaly Cardiovascular:  RRR, no m/r/g. No LE edema.  Respiratory:   CTA bilaterally with no wheezes/rales/rhonchi.  Normal respiratory effort. Abdomen:  distended, less drainage currently but he is unsure when they were last emptied, diffusely TTP Skin:  no rash or induration seen on limited exam Musculoskeletal:  grossly normal tone BUE/BLE, good ROM, no bony abnormality Psychiatric:  grossly normal mood and affect, speech fluent and appropriate, AOx3 Neurologic:  CN 2-12 grossly intact, moves all extremities in coordinated fashion  Data Reviewed: I have reviewed the patient's lab results since admission.  Pertinent labs for today include:   Na++ 131 Glucose 148 BUN 23/Creatinine 1.93/GFR 37; 9/0.97/>60 on 11/20 BNP 261.2 HS troponin 27, 40 Lactate 5.3, 1.1 WBC 12.9 Hgb 8.8  - stable   Family  Communication: None present;   Disposition: Status is: Inpatient Remains inpatient appropriate because: ongoing management     Time spent: 50 minutes  Unresulted Labs (From admission, onward)     Start     Ordered   05/04/23 0500  Basic metabolic panel  Tomorrow morning,   R        05/03/23 1452   05/03/23 1435  Hepatic function panel  Add-on,   AD        05/03/23 1434   05/03/23 1435  Magnesium  Add-on,   AD        05/03/23 1434   05/03/23 0500  Heparin level (unfractionated)  Daily,   R      05/02/23 1318   05/03/23 0500  CBC  Daily,   R      05/02/23 1318             Author: Jonah Blue, MD 05/03/2023 3:10 PM  For on call review [email or web address].

## 2023-05-03 NOTE — Consult Note (Signed)
[facility] Nephrology Consultation Note  Requesting MD: Dr. Jonah Blue Reason for consult: AKI  HPI:  Jared [name] is a 67 y.o. male with past medical history significant for HTN, HLD, PAD, paroxysmal atrial flutter on anticoagulation, stroke, COPD on oxygen, CAD, CHF with preserved EF who was admitted for sepsis due to acute perforated appendicitis with microperforation status post lap appendectomy on 11/20, seen as a consultation for further evaluation of acute kidney injury. On admission patient was febrile 100.9 with leukocytosis, elevated lactic acid level and a creatinine level of 0.97  The CT scan showed acute perforated appendicitis therefore general surgery was consulted.  He underwent lap appendectomy the next day with placement of surgical drain.  Treating with IV Zosyn.  The diet changed to clear liquid.  On postoperative day 1 the creatinine level elevated to 1.61 which is further gone up to 1.93 today.  Urine output is recorded 625.  He is currently on multiple cardiac medication.  I started gentle IV hydration today with LR.  Also undergoing continuous EEG for the evaluation of syncopal episode. UA was clear.  Recent CT scan ruled out hydronephrosis. Patient denies nausea, vomiting, chest pain or shortness of breath.  He reports shortness around his abdomen.  PMHx:   Past Medical History:  Diagnosis Date   AVM (arteriovenous malformation)    CAD (coronary artery disease)    a. LHC 5/12:  LAD 20, pLCx 20, pRCA 40, dRCA 40, EF 35%, diff HK  //  b. Myoview 4/16: Overall Impression:  High risk stress nuclear study There is no evidence of ischemia.  There is severe LV dysfunction. LV Ejection Fraction: 30%.  LV Wall Motion:  There is global LV hypokinesis.     CAP (community acquired pneumonia) 09/2013   Chronic combined systolic and diastolic CHF (congestive heart failure) (HCC)    a. Echo 4/16:Mild LVH, EF 40-45%, diffuse HK //  b. Echo 8/17: EF 35-40%, diffuse  HK, diastolic dysfunction, aortic sclerosis, trivial MR, moderate LAE, normal RVSF, moderate RAE, mild TR, PASP 42 mmHg // c. Echo 4/18: Mild concentric LVH, EF 30-35, normal wall motion, grade 1 diastolic dysfunction, PASP 49   Chronic respiratory failure (HCC)    Cluster headache    "hx; haven't had one in awhile" (01/09/2016)   COPD (chronic obstructive pulmonary disease) (HCC)    [name] 01/09/2016   DM (diabetes mellitus) (HCC)    History of CVA (cerebrovascular accident)    Hypertension    IDA (iron deficiency anemia)    Moderate tobacco use disorder    NICM (nonischemic cardiomyopathy) (HCC)    Nicotine addiction    Tobacco abuse    Type 2 diabetes mellitus (HCC) 05/14/2016    Past Surgical History:  Procedure Laterality Date   ABDOMINAL AORTOGRAM W/LOWER EXTREMITY N/A 12/07/2022   Procedure: ABDOMINAL AORTOGRAM W/LOWER EXTREMITY;  Surgeon: Leonie Douglas, MD;  [facility]: [facility];  Service: Cardiovascular;  Laterality: N/A;   BIOPSY  11/12/2020   Procedure: BIOPSY;  Surgeon: Lemar Lofty., MD;  Location: [facility] ENDOSCOPY;  Service: Gastroenterology;;   CARDIAC CATHETERIZATION  10/2010   LM normal, LAD with 20% irregularities, LCX with 20%, RCA with 40% prox and 40% distal - EF of 35%   CATARACT EXTRACTION, BILATERAL     COLONOSCOPY W/ BIOPSIES AND POLYPECTOMY     COLONOSCOPY WITH PROPOFOL N/A 09/06/2018   Procedure: COLONOSCOPY WITH PROPOFOL;  Surgeon: Tressia Danas, MD;  Location: [facility] ENDOSCOPY;  Service: Gastroenterology;  Laterality:  N/A;   ENTEROSCOPY N/A 09/28/2018   Procedure: ENTEROSCOPY;  Surgeon: Jeani Hawking, MD;  Location: [facility] ENDOSCOPY;  Service: Endoscopy;  Laterality: N/A;   ENTEROSCOPY N/A 10/28/2018   Procedure: ENTEROSCOPY;  Surgeon: Tressia Danas, MD;  Location: [facility] ENDOSCOPY;  Service: Gastroenterology;  Laterality: N/A;   ENTEROSCOPY N/A 10/09/2020   Procedure: ENTEROSCOPY;  Surgeon: Hilarie Fredrickson, MD;  Location: [facility] ENDOSCOPY;  Service:  Endoscopy;  Laterality: N/A;   ENTEROSCOPY N/A 03/22/2022   Procedure: ENTEROSCOPY;  Surgeon: Benancio Deeds, MD;  Location: [facility] ENDOSCOPY;  Service: Gastroenterology;  Laterality: N/A;   ESOPHAGOGASTRODUODENOSCOPY N/A 11/12/2020   Procedure: ESOPHAGOGASTRODUODENOSCOPY (EGD);  Surgeon: Lemar Lofty., MD;  Location: [facility] ENDOSCOPY;  Service: Gastroenterology;  Laterality: N/A;   ESOPHAGOGASTRODUODENOSCOPY (EGD) WITH PROPOFOL N/A 09/05/2018   Procedure: ESOPHAGOGASTRODUODENOSCOPY (EGD) WITH PROPOFOL;  Surgeon: Benancio Deeds, MD;  Location: [facility] ENDOSCOPY;  Service: Gastroenterology;  Laterality: N/A;   ESOPHAGOGASTRODUODENOSCOPY (EGD) WITH PROPOFOL N/A 11/19/2020   Procedure: ESOPHAGOGASTRODUODENOSCOPY (EGD) WITH PROPOFOL;  Surgeon: Beverley Fiedler, MD;  Location: [facility] ENDOSCOPY;  Service: Gastroenterology;  Laterality: N/A;   ESOPHAGOGASTRODUODENOSCOPY (EGD) WITH PROPOFOL N/A 12/27/2022   Procedure: ESOPHAGOGASTRODUODENOSCOPY (EGD) WITH PROPOFOL;  Surgeon: Napoleon Form, MD;  Location: [facility] ENDOSCOPY;  Service: Gastroenterology;  Laterality: N/A;   EXCISION MASS HEAD     GIVENS CAPSULE STUDY N/A 09/06/2018   Procedure: GIVENS CAPSULE STUDY;  Surgeon: Tressia Danas, MD;  Location: [facility] ENDOSCOPY;  Service: Gastroenterology;  Laterality: N/A;   GIVENS CAPSULE STUDY N/A 09/26/2018   Procedure: GIVENS CAPSULE STUDY;  Surgeon: Beverley Fiedler, MD;  Location: [facility] ENDOSCOPY;  Service: Gastroenterology;  Laterality: N/A;   GIVENS CAPSULE STUDY N/A 06/14/2019   Procedure: GIVENS CAPSULE STUDY;  Surgeon: Napoleon Form, MD;  Location: [facility] ENDOSCOPY;  Service: Endoscopy;  Laterality: N/A;   HEMOSTASIS CLIP PLACEMENT  11/12/2020   Procedure: HEMOSTASIS CLIP PLACEMENT;  Surgeon: Lemar Lofty., MD;  Location: [facility] ENDOSCOPY;  Service: Gastroenterology;;   HEMOSTASIS CONTROL  11/12/2020   Procedure: HEMOSTASIS CONTROL;  Surgeon: Lemar Lofty., MD;  Location: [facility] ENDOSCOPY;  Service:  Gastroenterology;;   HOT HEMOSTASIS N/A 10/28/2018   Procedure: HOT HEMOSTASIS (ARGON PLASMA COAGULATION/BICAP);  Surgeon: Tressia Danas, MD;  Location: [facility] ENDOSCOPY;  Service: Gastroenterology;  Laterality: N/A;   HOT HEMOSTASIS N/A 11/12/2020   Procedure: HOT HEMOSTASIS (ARGON PLASMA COAGULATION/BICAP);  Surgeon: Lemar Lofty., MD;  Location: [facility] ENDOSCOPY;  Service: Gastroenterology;  Laterality: N/A;   HOT HEMOSTASIS N/A 03/22/2022   Procedure: HOT HEMOSTASIS (ARGON PLASMA COAGULATION/BICAP);  Surgeon: Benancio Deeds, MD;  Location: [facility] ENDOSCOPY;  Service: Gastroenterology;  Laterality: N/A;   INCISION AND DRAINAGE PERIRECTAL ABSCESS N/A 06/05/2017   Procedure: IRRIGATION AND DEBRIDEMENT PERIRECTAL ABSCESS;  Surgeon: Andria Meuse, MD;  Location: [facility] OR;  Service: General;  Laterality: N/A;   LAPAROSCOPIC APPENDECTOMY N/A 05/01/2023   Procedure: APPENDECTOMY LAPAROSCOPIC;  Surgeon: Emelia Loron, MD;  Location: [facility] OR;  Service: General;  Laterality: N/A;   PERIPHERAL VASCULAR INTERVENTION  12/07/2022   Procedure: PERIPHERAL VASCULAR INTERVENTION;  Surgeon: Leonie Douglas, MD;  Location: [facility];  Service: Cardiovascular;;   SUBMUCOSAL TATTOO INJECTION  11/12/2020   Procedure: SUBMUCOSAL TATTOO INJECTION;  Surgeon: Lemar Lofty., MD;  Location: [facility] ENDOSCOPY;  Service: Gastroenterology;;   VIDEO BRONCHOSCOPY Bilateral 05/08/2016   Procedure: VIDEO BRONCHOSCOPY WITH FLUORO;  Surgeon: Oretha Milch, MD;  Location: [facility] ENDOSCOPY;  Service: Cardiopulmonary;  Laterality: Bilateral;    Family Hx:  Family History  Problem Relation Age of Onset   Heart disease Mother    Diabetes Mother    Colon cancer Mother    Liver cancer Mother    Cancer Father        type unknown   Diabetes Sister        x 2   Diabetes Brother     Social History:  reports that he has been smoking cigarettes. He has a 23.5 pack-year smoking history. He has never used  smokeless tobacco. He reports that he does not currently use alcohol. He reports that he does not use drugs.  Allergies:  Allergies  Allergen Reactions   Lisinopril Cough    Medications: Prior to Admission medications   Medication Sig Start Date End Date Taking? Authorizing Provider  acetaminophen (TYLENOL) 500 MG tablet Take 2 tablets (1,000 mg total) by mouth every 8 (eight) hours as needed for moderate pain. 01/03/23  Yes Newlin, Enobong, MD  albuterol (VENTOLIN HFA) 108 (90 Base) MCG/ACT inhaler INHALE 2 PUFFS INTO THE LUNGS EVERY 6 (SIX) HOURS AS NEEDED FOR WHEEZING OR SHORTNESS OF BREATH. Patient taking differently: Inhale 2 puffs into the lungs daily as needed for wheezing or shortness of breath. 12/06/22  Yes [name], MD  aspirin EC 81 MG tablet Take 1 tablet (81 mg total) by mouth daily. Swallow whole. 12/07/22  Yes Leonie Douglas, MD  atorvastatin (LIPITOR) 80 MG tablet Take 1 tablet (80 mg total) by mouth daily. 02/04/23  Yes [name], MD  DULoxetine (CYMBALTA) 60 MG capsule Take 1 capsule (60 mg total) by mouth daily. For chronic leg pains 01/09/23  Yes [name], MD  ergocalciferol (DRISDOL) 1.25 MG (50000 UT) capsule Take 1 capsule (50,000 Units total) by mouth once a week. 02/26/23  Yes [name], MD  ezetimibe (ZETIA) 10 MG tablet Take 1 tablet (10 mg total) by mouth daily. 01/31/23  Yes Pricilla Riffle, MD  ferrous sulfate (FEROSUL) 325 (65 FE) MG tablet Take 1 tablet (325 mg total) by mouth daily with breakfast. 01/30/23  Yes Danis, Andreas Blower, MD  folic acid (FOLVITE) 1 MG tablet Take 1 tablet (1 mg total) by mouth daily. 01/30/23  Yes [name], MD  furosemide (LASIX) 40 MG tablet Take 1-2 tablets (40-80 mg total) by mouth 2 (two) times daily as needed.Take an extra pill with worsening pedal edema. 01/09/23  Yes [name], MD  gabapentin (NEURONTIN) 300 MG capsule Take 1 capsule (300 mg total) by mouth 2 (two) times daily. 02/25/23  Yes Kathryne Hitch, MD  hydrALAZINE (APRESOLINE) 50 MG tablet Take 1 tablet (50 mg total) by mouth every 8 (eight) hours. 02/04/23  Yes [name], MD  isosorbide mononitrate (IMDUR) 30 MG 24 hr tablet Take 1 tablet (30 mg total) by mouth daily. 01/30/23  Yes [name], MD  metoprolol succinate (TOPROL-XL) 50 MG 24 hr tablet Take 1 tablet (50 mg total) by mouth daily. Take with or immediately following a meal. 02/04/23  Yes Newlin, Enobong, MD  nitroGLYCERIN (NITROSTAT) 0.4 MG SL tablet Place 1 tablet (0.4 mg total) under the tongue every 5 (five) minutes as needed for chest pain. 11/19/20 04/30/23 Yes Albertine Grates, MD  OXYGEN Inhale 2 L/min into the lungs continuous.   Yes [provider]  pantoprazole (PROTONIX) 40 MG tablet Take 1 tablet (40 mg total) by mouth daily. 02/04/23  Yes [name], MD  rivaroxaban (XARELTO) 20 MG TABS tablet Take 1 tablet (20 mg  total) by mouth daily with supper. 10/25/22  Yes [name], MD  Accu-Chek Softclix Lancets lancets Use to check blood sugar once daily. 01/09/23   [name], MD  Blood Glucose Monitoring Suppl (ACCU-CHEK GUIDE) w/Device KIT Use to check blood sugar once daily. 01/30/23   [name], MD  Budeson-Glycopyrrol-Formoterol (BREZTRI AEROSPHERE) 160-9-4.8 MCG/ACT AERO Take 2 puffs first thing in morning and then another 2 puffs about 12 hours later. 02/25/23   Nyoka Cowden, MD  glucose blood (ACCU-CHEK GUIDE) test strip Use to check blood sugar once daily. 01/09/23   [name], MD  TUDORZA PRESSAIR 400 MCG/ACT AEPB INHALE 1 PUFF INTO THE LUNGS IN THE MORNING AND AT BEDTIME. 03/29/20 04/04/20  Nyoka Cowden, MD    I have reviewed the patient's current medications.  Labs: Renal Panel: Recent Labs  Lab 04/30/23 1027 05/01/23 0749 05/02/23 0755 05/03/23 0657  NA 135 135 135 131*  K 3.6 3.3* 4.3 4.2  CL 98 96* 96* 94*  CO2 27 29 30 31   GLUCOSE 129* 98 193* 148*  BUN 12 8 14 23   CREATININE 1.65* 0.97 1.61* 1.93*   CALCIUM 8.5* 8.2* 8.4* 8.1*     CBC:    Latest Ref Rng & Units 05/03/2023    6:57 AM 05/02/2023    7:55 AM 05/01/2023    7:49 AM  CBC  WBC 4.0 - 10.5 K/uL 12.9  10.2  11.0   Hemoglobin 13.0 - 17.0 g/dL 8.8  9.6  8.8   Hematocrit 39.0 - 52.0 % 30.5  33.2  30.1   Platelets 150 - 400 K/uL 359  355  246      Anemia Panel:  Recent Labs    10/25/22 1027 12/07/22 0757 12/26/22 1251 12/26/22 2345 01/28/23 1335 02/25/23 0938 03/25/23 0920 04/22/23 0919 04/30/23 1027 05/01/23 0749 05/02/23 0755 05/03/23 0657  HGB 11.5*   < >  --    < > 10.6* 10.6* 10.7* 10.6* 10.1* 8.8* 9.6* 8.8*  MCV 90   < >  --    < > 87.7 92.5 93.2 93.5 90.8 89.6 91.0 91.0  VITAMINB12 946  --  718  --   --   --   --   --   --   --   --   --   FOLATE  --   --  26.0  --   --   --   --   --   --   --   --   --   FERRITIN  --   --  3*  --  10* 77 22* 38  --   --   --   --   TIBC  --   --  441  --  367 349 344 350  --   --   --   --   IRON  --   --  5*  --  29* 82 25* 64  --   --   --   --   RETICCTPCT  --   --  1.2  --   --   --   --   --   --   --   --   --    < > = values in this interval not displayed.    Recent Labs  Lab 04/30/23 1144 05/02/23 0755  AST 32 21  ALT 18 14  ALKPHOS 72 57  BILITOT 1.3* 0.4  PROT 7.3 6.0*  ALBUMIN 3.7 2.7*  Lab Results  Component Value Date   HGBA1C 5.0 10/25/2022    ROS:  Pertinent items noted in HPI and remainder of comprehensive ROS otherwise negative.  Physical Exam: Vitals:   05/03/23 0714 05/03/23 0928  BP: 114/67 109/72  Pulse: 100 100  Resp: 16   Temp: 99.1 F (37.3 C)   SpO2: 93%      General exam: Appears calm and comfortable  Respiratory system: Clear to auscultation. Respiratory effort normal. No wheezing or crackle Cardiovascular system: S1 & S2 heard, RRR.  No pedal edema. Gastrointestinal system: Abdomen drainage present, mild distention. Central nervous system: Alert and oriented. No focal neurological deficits. Extremities:  Symmetric 5 x 5 power. Skin: No rashes, lesions or ulcers Psychiatry: Judgement and insight appear normal. Mood & affect appropriate.   Assessment/Plan:  # Acute kidney injury likely prerenal etiology concomitant with sepsis due to perforated appendix required surgical intervention.  UA is clear and kidney ultrasound without hydronephrosis.  Agree with continuing IV fluid.  No IV contrast or other nephrotoxins identified.  Patient is nonoliguric.   Will do bladder scan to make sure there is no obstruction. Continue with strict ins and out, daily lab. If no improvement in renal function then may consider repeating scan of kidneys.  # Sepsis due to acute perforated appendicitis with microperforation status post laparoscopic appendectomy on 11/20.  Currently on antibiotics and has abdomen drain.  Management per surgery team.  # Hyponatremia: Check urine sodium.  Continue with IV hydration.  # Tremors, syncope: Neurology is following, noted getting EEG.  # Hypertension: Monitor blood pressure, currently on cardiac medication.  Avoid hypotensive episode.  Thank you for the consult, we will continue to follow.   Leighana Neyman Jaynie Collins 05/03/2023, 3:17 PM  [facility].

## 2023-05-03 NOTE — Progress Notes (Signed)
Lower extremity venous duplex completed. Please see CV Procedures for preliminary results.  Shona Simpson, RVT 05/03/23 12:01 PM

## 2023-05-03 NOTE — Evaluation (Addendum)
Physical Therapy Evaluation Patient Details Name: Jared [name] MRN: [id number] DOB: 01-01-1956 Today's Date: 05/03/2023  History of Present Illness  67 y.o. male [facility] to [facility]/30/23 w/ acute L lower abdominal pain, L testicle pain, and h/a. Admitted w/ sepsis 2/2 acute perforated appendicitis s/p laparoscopic appendectomy 11/20. Pt has history of syncope and LOC when standing up. Echo w/ density in IVC, VQ scan negative, EEG study findings within normal limits, negative for LE DVT. PMHx: AVM, chronic respiratory failure (2L O2 at home) CABG, CAD, CAP, CHF, COPD, CVA, HTN, A flutter, DM2   Clinical Impression  Pt in bed upon arrival and agreeable to PT eval. Prior to admission, pt was independent with no AD in the home and rollator in the community. In today's session, pt was able to stand and ambulate to the bathroom with CGA for safety and RW. Pt required assistance for pericare in standing. Pt presents to therapy session with decreased balance, activity tolerance, and mobility. Pt would benefit from acute skilled PT to address functional impairments. Recommending post-acute HHPT to work on activity tolerance. Acute PT to follow.          If plan is discharge home, recommend the following: A little help with walking and/or transfers;Assist for transportation;Help with stairs or ramp for entrance   Can travel by private vehicle    Yes    Equipment Recommendations None recommended by PT     Functional Status Assessment Patient has had a recent decline in their functional status and demonstrates the ability to make significant improvements in function in a reasonable and predictable amount of time.     Precautions / Restrictions Precautions Precautions: Fall Precaution Comments: abdominal surgery, open drain L abdomen Restrictions Weight Bearing Restrictions: No      Mobility  Bed Mobility Overal bed mobility: Needs Assistance Bed Mobility: Supine to Sit     Supine to sit:  Min assist, HOB elevated, Used rails     General bed mobility comments: MinA with 1UE for trunk elevation, cues for log roll technique however pt prefers to sit upright, then swing LE's off EOB    Transfers Overall transfer level: Needs assistance Equipment used: Rolling walker (2 wheels) Transfers: Sit to/from Stand Sit to Stand: Supervision      General transfer comment: supervision for safety    Ambulation/Gait Ambulation/Gait assistance: Contact guard assist, +2 safety/equipment Gait Distance (Feet): 40 Feet Assistive device: Rolling walker (2 wheels) Gait Pattern/deviations: Step-through pattern, Shuffle Gait velocity: dec     General Gait Details: CGA for safety, pt had difficulty managing RW with turns        Balance Overall balance assessment: Needs assistance, Mild deficits observed, not formally tested Sitting-balance support: No upper extremity supported, Feet supported Sitting balance-Leahy Scale: Good     Standing balance support: Bilateral upper extremity supported, During functional activity, Reliant on assistive device for balance Standing balance-Leahy Scale: Poor Standing balance comment: reliant on RW        Pertinent Vitals/Pain Pain Assessment Pain Assessment: Faces Faces Pain Scale: Hurts little more Pain Location: abdomen incision Pain Descriptors / Indicators: Aching, Discomfort Pain Intervention(s): Limited activity within patient's tolerance, Monitored during session, Repositioned    Home Living Family/patient expects to be discharged to:: Private residence Living Arrangements: Non-relatives/Friends (boarding house) Available Help at Discharge: Other (Comment) (no support) Type of Home: House Home Access: Stairs to enter Entrance Stairs-Rails: Can reach both;Left;Right Entrance Stairs-Number of Steps: 4   Home Layout: One level Home Equipment: Rollator (  4 wheels);Rolling Walker (2 wheels);Shower seat;Grab bars -  tub/shower;BSC/3in1 Additional Comments: 2L O2 all times    Prior Function Prior Level of Function : Independent/Modified Independent;History of Falls (last six months)    Mobility Comments: Rollator in community, no AD in home. Pt reports falls after experiencing syncope. Pt can usually tell when these episodes will happen ADLs Comments: Mod I with ADL/IADLs, sister or medicaid provides transportation     Extremity/Trunk Assessment   Upper Extremity Assessment Upper Extremity Assessment: Defer to OT evaluation    Lower Extremity Assessment Lower Extremity Assessment: Overall WFL for tasks assessed    Cervical / Trunk Assessment Cervical / Trunk Assessment: Normal  Communication   Communication Communication: No apparent difficulties Cueing Techniques: Verbal cues  Cognition Arousal: Alert Behavior During Therapy: WFL for tasks assessed/performed Overall Cognitive Status: Within Functional Limits for tasks assessed     General Comments General comments (skin integrity, edema, etc.): on 2L [place] upon arrival, bumped to 3L with activty w/ SpO2>92%     PT Assessment Patient needs continued PT services  PT Problem List Decreased activity tolerance;Decreased balance;Decreased mobility;Decreased safety awareness       PT Treatment Interventions DME instruction;Stair training;Gait training;Functional mobility training;Therapeutic activities;Balance training;Therapeutic exercise;Patient/family education    PT Goals (Current goals can be found in the Care Plan section)  Acute Rehab PT Goals Patient Stated Goal: to go home PT Goal Formulation: With patient Time For Goal Achievement: 05/17/23 Potential to Achieve Goals: Good    Frequency Min 1X/week        AM-PAC PT "6 Clicks" Mobility  Outcome Measure Help needed turning from your back to your side while in a flat bed without using bedrails?: None Help needed moving from lying on your back to sitting on the side of a flat  bed without using bedrails?: A Little Help needed moving to and from a bed to a chair (including a wheelchair)?: A Little Help needed standing up from a chair using your arms (e.g., wheelchair or bedside chair)?: A Little Help needed to walk in hospital room?: A Little Help needed climbing 3-5 steps with a railing? : A Lot 6 Click Score: 18    End of Session Equipment Utilized During Treatment: Gait belt;Oxygen Activity Tolerance: Patient tolerated treatment well Patient left: in chair;with call bell/phone within reach Nurse Communication: Mobility status (bump in 02) PT Visit Diagnosis: Unsteadiness on feet (R26.81);Repeated falls (R29.6)    Time: 1650-1735 PT Time Calculation (min) (ACUTE ONLY): 45 min  16:50- 17:15,  17:25-17:35  Charges:   PT Evaluation $PT Eval Low Complexity: 1 Low PT Treatments $Therapeutic Activity: 8-22 mins PT General Charges $$ ACUTE PT VISIT: 1 Visit        Hilton Cork, PT, DPT Secure Chat Preferred  Rehab Office [phone]   Arturo Morton Brion Aliment 05/03/2023, 5:48 PM

## 2023-05-03 NOTE — Plan of Care (Signed)
  Problem: Education: Goal: Knowledge of General Education information will improve Description: Including pain rating scale, medication(s)/side effects and non-pharmacologic comfort measures Outcome: Progressing   Problem: Clinical Measurements: Goal: Respiratory complications will improve Outcome: Progressing Goal: Cardiovascular complication will be avoided Outcome: Progressing   Problem: Pain Management: Goal: General experience of comfort will improve Outcome: Progressing

## 2023-05-03 NOTE — Progress Notes (Signed)
LTM EEG discontinued - no skin breakdown at unhook.   

## 2023-05-03 NOTE — Progress Notes (Signed)
PHARMACY - ANTICOAGULATION CONSULT NOTE  Pharmacy Consult for heparin  Indication: pulmonary embolus, suspected  Allergies  Allergen Reactions   Lisinopril Cough    Patient Measurements: Height: 6\' 3"  (190.5 cm) Weight: 99.3 kg (219 lb) IBW/kg (Calculated) : 84.5 Heparin Dosing Weight: 99.3 kg   Vital Signs: Temp: 99.1 F (37.3 C) (11/22 0714) Temp Source: Oral (11/22 0714) BP: 114/67 (11/22 0714) Pulse Rate: 100 (11/22 0714)  Labs: Recent Labs    04/30/23 1144 04/30/23 1317 05/01/23 0749 05/02/23 0755 05/02/23 1717 05/02/23 1718 05/02/23 1906 05/03/23 0657  HGB  --   --  8.8* 9.6*  --   --   --  8.8*  HCT  --   --  30.1* 33.2*  --   --   --  30.5*  PLT  --   --  246 355  --   --   --  359  APTT 45*  --   --   --   --   --   --   --   LABPROT 20.4*  --   --   --   --   --   --   --   INR 1.7*  --   --   --   --   --   --   --   HEPARINUNFRC  --   --   --   --   --   --  0.65 0.62  CREATININE  --   --  0.97 1.61*  --   --   --  1.93*  TROPONINIHS 48* 36*  --   --  37* 40*  --   --     Estimated Creatinine Clearance: 43.2 mL/min (A) (by C-G formula based on SCr of 1.93 mg/dL (H)).   Medical History: Past Medical History:  Diagnosis Date   AVM (arteriovenous malformation)    CAD (coronary artery disease)    a. LHC 5/12:  LAD 20, pLCx 20, pRCA 40, dRCA 40, EF 35%, diff HK  //  b. Myoview 4/16: Overall Impression:  High risk stress nuclear study There is no evidence of ischemia.  There is severe LV dysfunction. LV Ejection Fraction: 30%.  LV Wall Motion:  There is global LV hypokinesis.     CAP (community acquired pneumonia) 09/2013   Chronic combined systolic and diastolic CHF (congestive heart failure) (HCC)    a. Echo 4/16:Mild LVH, EF 40-45%, diffuse HK //  b. Echo 8/17: EF 35-40%, diffuse HK, diastolic dysfunction, aortic sclerosis, trivial MR, moderate LAE, normal RVSF, moderate RAE, mild TR, PASP 42 mmHg // c. Echo 4/18: Mild concentric LVH, EF 30-35, normal  wall motion, grade 1 diastolic dysfunction, PASP 49   Chronic respiratory failure (HCC)    Cluster headache    "hx; haven't had one in awhile" (01/09/2016)   COPD (chronic obstructive pulmonary disease) (HCC)    [name] 01/09/2016   DM (diabetes mellitus) (HCC)    History of CVA (cerebrovascular accident)    Hypertension    IDA (iron deficiency anemia)    Moderate tobacco use disorder    NICM (nonischemic cardiomyopathy) (HCC)    Nicotine addiction    Tobacco abuse    Type 2 diabetes mellitus (HCC) 05/14/2016    Assessment: 67 yo M with suspected PE from ECHO findings. V/Q scan pending. Pt is s/p appendectomy with drain placement on 11/20.   Hgb 9.6, Plt 355 Enoxaparin 40mg  SQ given this AM: 11/21 @ 0927  05/03/23 AM  update: HL 0.62 Hgb 8.8 PLT wnl No signs of bleeding or issues with heparin gtt   Goal of Therapy:  Heparin level 0.3-0.7 units/ml Monitor platelets by anticoagulation protocol: Yes   Plan:  Continue heparin infusion at 1600 units/hr Daily HL, CBC F/u s/sx bleeding, Hgb trend     Janayia Burggraf BS, PharmD, BCPS Clinical Pharmacist 05/03/2023 8:27 AM  Contact: [phone] after 3 PM  "Be curious, not judgmental..." -Debbora Dus

## 2023-05-03 NOTE — Progress Notes (Signed)
NEUROLOGY CONSULT FOLLOW UP NOTE   Date of service: May 03, 2023 Patient Name: Jared [name] MRN:  [id number] DOB:  1956-04-08  Brief HPI  Jared [name] is a 67 y.o. male with past medical history significant for CVA, hypertension, type 2 diabetes, tobacco use, CAD, chronic combined systolic and diastolic CHF, cluster headaches, COPD, iron deficiency anemia, AVM who presented to the ED 11/19 complaining of headaches, abdominal pain.patient was also noted to have decreased p.o. intake with intermittent syncopal episodes for the last 2 years, was found to have acute perforated appendicitis on CT A/P and was taken to the OR.  Echo obtained prior to surgery revealed grade 2 diastolic dysfunction, moderate RV dysfunction, RV dilation and density and IVC concerning for thrombus.   Neurology was consulted for left upper extremity tremor.    Interval Hx/subjective   On initial neurological consultation exam, patient told examiner that the tremors have been going on for at least the last 6 years and he has been to multiple doctors.  He stated that sometimes he has loss of consciousness in the last 3 months the tremors have gotten worse.  Yesterday patient was noted to have bilateral upper extremity tremors lasting about 20 seconds and spontaneously stopping. On exam today patient had movements that seem to more attributed to asterixis than tremors. LTM in place, negative.   Vitals   Vitals:   05/03/23 0442 05/03/23 0503 05/03/23 0714 05/03/23 0928  BP: 120/70 132/63 114/67 109/72  Pulse: (!) 104 79 100 100  Resp: 18 17 16    Temp: 99.1 F (37.3 C) 98.1 F (36.7 C) 99.1 F (37.3 C)   TempSrc: Oral  Oral   SpO2: 93% 98% 93%   Weight:      Height:         Body mass index is 27.37 kg/m.  Physical Exam   Constitutional: Appears well-developed and well-nourished.  HHENT: No OP obstrucion.  Cardiovascular: Normal rate and regular rhythm.  Respiratory: Effort normal, non-labored breathing.    Neurologic Examination   Neuro: Mental Status: Patient is awake, alert, oriented to person, place, month, year, and situation. Naming, repetition, comprehension found intact.  Normal attention span and concentration.  No dysarthria, aphasia or neglect. Cranial Nerves: II: Visual Fields are full. Pupils are equal, round, and reactive to light.   III,IV, VI: EOMI without ptosis or diploplia.  V: Facial sensation is symmetric to light touch VII: Facial movement is symmetric.  VIII: hearing is intact to voice X: Uvula elevates symmetrically XI: Shoulder shrug is symmetric. XII: tongue is midline  Motor: Tone is normal. Bulk is normal. 5/5 strength BUE.  BLE with limited exam due to pain, but can lift off bed against gravity. Sensory: Sensation is symmetric to light touch and temperature in the arms and legs. Cerebellar: FNF intact bilaterally with no tremor seen.   Labs and Diagnostic Imaging   CBC:  Recent Labs  Lab 05/02/23 0755 05/03/23 0657  WBC 10.2 12.9*  HGB 9.6* 8.8*  HCT 33.2* 30.5*  MCV 91.0 91.0  PLT 355 359    Basic Metabolic Panel:  Lab Results  Component Value Date   NA 131 (L) 05/03/2023   K 4.2 05/03/2023   CO2 31 05/03/2023   GLUCOSE 148 (H) 05/03/2023   BUN 23 05/03/2023   CREATININE 1.93 (H) 05/03/2023   CALCIUM 8.1 (L) 05/03/2023   GFRNONAA 37 (L) 05/03/2023   GFRAA 91 01/29/2020   Lipid Panel:  Lab Results  Component Value Date  LDLCALC 62 10/25/2022   HgbA1c:  Lab Results  Component Value Date   HGBA1C 5.0 10/25/2022   Urine Drug Screen:     Component Value Date/Time   LABOPIA POSITIVE (A) 04/30/2023 2155   COCAINSCRNUR NONE DETECTED 04/30/2023 2155   LABBENZ NONE DETECTED 04/30/2023 2155   AMPHETMU NONE DETECTED 04/30/2023 2155   THCU NONE DETECTED 04/30/2023 2155   LABBARB NONE DETECTED 04/30/2023 2155    Alcohol Level     Component Value Date/Time   ETH 70 (H) 07/25/2016 1720   INR  Lab Results  Component Value  Date   INR 1.7 (H) 04/30/2023   APTT  Lab Results  Component Value Date   APTT 45 (H) 04/30/2023   AED levels: No results found for: "PHENYTOIN", "ZONISAMIDE", "LAMOTRIGINE", "LEVETIRACETA"  CT Head without contrast 11/19 (Personally reviewed): No CT finding to explain frontal headache. Trace left mastoid effusion.   Impression   Dannis [name] is a 67 y.o. male with past medical history significant for CVA, HTN, DM, tobacco use, CAD, CHF, cluster headaches, COPD, who presented to the ED 11/19 complaining of headaches, abdominal pain, found to have acute perforated appendicitis on CT A/P and was taken to the OR.    Neurology was consulted for left upper extremity tremor, which patient states been going on for at least 6 years.  Tremors have not been witnessed on exam.  Motion seen today could be more attributed to asterixis as they were flapping-type movements of the hands.  This can be secondary to gabapentin and opoid use. Will also order labs to evaluate possible other etiologies.    Recommendations  - Discontinue LTM EEG - Judicious use of gabapentin and opoid medications - LFTs (ordered) - ammonia level (ordered) - Magnesium level (ordered), replete as needed  Neurology will sign off. Please recall as needed.  _______________________________________________________________   Pt seen by Neuro NP/APP and later by MD. Note/plan to be edited by MD as needed.    Lynnae January, DNP, AGACNP-BC [facility] Please use AMION for contact information & EPIC for messaging.   NEUROHOSPITALIST ADDENDUM Performed a face to face diagnostic evaluation.   I have reviewed the contents of history and physical exam as documented by PA/ARNP/Resident and agree with above documentation.  I have discussed and formulated the above plan as documented. Edits to the note have been made as needed.  Impression/Key exam findings/Plan: several tremor events captured on LTM with no EEG  correlate. These are not seizure. Also noted on exam was slight BL upper ext asterixis/negative myoclonus. Ammonia is normal. However, he is on Gabapentin and opiods. I would recommend judicious use of opiods and gabapentin. Further escalation can certainly cause prominent asterixis which can be very disabling.  We will signoff.  Erick Blinks, MD [facility] [phone]   If 7pm to 7am, please call on call as listed on AMION.

## 2023-05-03 NOTE — Care Management Important Message (Signed)
Important Message  Patient Details  Name: Jared [name] MRN: [id number] Date of Birth: 29-Sep-1955   Important Message Given:  Yes - Medicare IM     Sherilyn Banker 05/03/2023, 3:51 PM

## 2023-05-03 NOTE — Procedures (Addendum)
Patient Name: Jared [name]  MRN: [id number]  Epilepsy Attending: Charlsie Quest  Referring Physician/Provider: Gevena Mart, NP Duration: 05/02/2023 1937 to 05/03/2023 1550  Patient history: 67 yo M with arm tremors, hx of tremors with LOC, frequency increased in the last 3 months, had episode today without LOC. EEG to evaluate for seizure.   Level of alertness: Awake, asleep  AEDs during EEG study: GBP  Technical aspects: This EEG study was done with scalp electrodes positioned according to the 10-20 International system of electrode placement. Electrical activity was reviewed with band pass filter of 1-70Hz , sensitivity of 7 uV/mm, display speed of 57mm/sec with a 60Hz  notched filter applied as appropriate. EEG data were recorded continuously and digitally stored.  Video monitoring was available and reviewed as appropriate.  Description: The posterior dominant rhythm consists of 8 Hz activity of moderate voltage (25-35 uV) seen predominantly in posterior head regions, symmetric and reactive to eye opening and eye closing. Sleep was characterized by vertex waves, sleep spindles (12 to 14 Hz), maximal frontocentral region.    Event button was pressed over 50 times after 05/03/2023 0416 for tremors of the upper extremity. Concomitant EEG before, during and after the event showed normal posterior dominant rhythm, did not show any EEG changes suggest seizure.  Hyperventilation and photic stimulation were not performed.     IMPRESSION: This study is within normal limits. No seizures or epileptiform discharges were seen throughout the recording.  Event button was pressed several times during the study for tremors of upper extremity without concomitant EEG change.  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  Saylah Ketner Annabelle Harman

## 2023-05-03 NOTE — Progress Notes (Signed)
2 Days Post-Op   Subjective/Chief Complaint: Small amount of flatus and small Bm but still very bloated. No n/v. Abdominal pain controlled with meds. Not mobilizing much. SHOB is a little worse than normal for him but no increased o2 requirement   Objective: Vital signs in last 24 hours: Temp:  [98 F (36.7 C)-99.1 F (37.3 C)] 99.1 F (37.3 C) (11/22 0714) Pulse Rate:  [79-130] 100 (11/22 0928) Resp:  [16-18] 16 (11/22 0714) BP: (101-132)/(63-75) 109/72 (11/22 0928) SpO2:  [92 %-98 %] 93 % (11/22 0714) Last BM Date : 04/25/23  Intake/Output from previous day: 11/21 0701 - 11/22 0700 In: 987.3 [P.O.:360; I.V.:368.3; IV Piggyback:259] Out: 700 [Urine:625; Drains:75] Intake/Output this shift: No intake/output data recorded.  General nad Cv regular Pulm effort normal, on oxygen 2lpm Ab approp tender, moderately distended, drain serous  Lab Results:  Recent Labs    05/02/23 0755 05/03/23 0657  WBC 10.2 12.9*  HGB 9.6* 8.8*  HCT 33.2* 30.5*  PLT 355 359   BMET Recent Labs    05/02/23 0755 05/03/23 0657  NA 135 131*  K 4.3 4.2  CL 96* 94*  CO2 30 31  GLUCOSE 193* 148*  BUN 14 23  CREATININE 1.61* 1.93*  CALCIUM 8.4* 8.1*   PT/INR Recent Labs    04/30/23 1144  LABPROT 20.4*  INR 1.7*   ABG No results for input(s): "PHART", "HCO3" in the last 72 hours.  Invalid input(s): "PCO2", "PO2"  Studies/Results: Overnight EEG with video  Result Date: 05/03/2023 Charlsie Quest, MD     05/03/2023  9:55 AM Patient Name: Jared [name] MRN: [id number] Epilepsy Attending: Charlsie Quest Referring Physician/Provider: Gevena Mart, NP Duration: 05/02/2023 1937 to 05/03/2023 0800 Patient history: 67 yo M with arm tremors, hx of tremors with LOC, frequency increased in the last 3 months, had episode today without LOC. EEG to evaluate for seizure. Level of alertness: Awake, asleep AEDs during EEG study: GBP Technical aspects: This EEG study was done with scalp electrodes  positioned according to the 10-20 International system of electrode placement. Electrical activity was reviewed with band pass filter of 1-70Hz , sensitivity of 7 uV/mm, display speed of 23mm/sec with a 60Hz  notched filter applied as appropriate. EEG data were recorded continuously and digitally stored.  Video monitoring was available and reviewed as appropriate. Description: The posterior dominant rhythm consists of 8 Hz activity of moderate voltage (25-35 uV) seen predominantly in posterior head regions, symmetric and reactive to eye opening and eye closing. Sleep was characterized by vertex waves, sleep spindles (12 to 14 Hz), maximal frontocentral region.  Event button was pressed up to 48 times after 05/03/2023 0416 for tremors of the upper extremity. Concomitant EEG before, during and after the event showed normal posterior dominant rhythm, did not show any EEG changes suggest seizure. Hyperventilation and photic stimulation were not performed.   IMPRESSION: This study is within normal limits. No seizures or epileptiform discharges were seen throughout the recording. Event button was pressed several times during the study for tremors of upper extremity without concomitant EEG change.  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 Charlsie Quest   NM Pulmonary Perf and Vent  Result Date: 05/02/2023 CLINICAL DATA:  Evaluate for pulmonary embolism. EXAM: NUCLEAR MEDICINE PERFUSION LUNG SCAN TECHNIQUE: Perfusion images were obtained in multiple projections after intravenous injection of radiopharmaceutical. Ventilation scans intentionally deferred if perfusion scan and chest x-ray adequate for interpretation during COVID 19 epidemic. RADIOPHARMACEUTICALS:  4 mCi Tc-2m MAA IV COMPARISON:  Chest radiograph-earlier  same day; 04/30/2023 Chest CT-07/12/2022 FINDINGS: Review of chest radiograph performed earlier same day demonstrates unchanged enlarged cardiac silhouette and mediastinal contours. Old left-sided rib fractures.  No focal airspace opacities. No pleural effusion or pneumothorax. No evidence of edema Perfusion imaging demonstrates mild heterogeneous perfusion of the bilateral pulmonary parenchyma without discrete area of non perfusion to suggest pulmonary embolism. IMPRESSION: Pulmonary embolism absent. No discrete areas of non perfusion to suggest pulmonary embolism. Electronically Signed   By: Simonne Come M.D.   On: 05/02/2023 17:35   DG CHEST PORT 1 VIEW  Result Date: 05/02/2023 CLINICAL DATA:  Dyspnea EXAM: PORTABLE CHEST 1 VIEW COMPARISON:  None Available. FINDINGS: Normal mediastinum and cardiac silhouette. Normal pulmonary vasculature. No evidence of effusion, infiltrate, or pneumothorax. No acute bony abnormality. IMPRESSION: Normal chest radiograph Electronically Signed   By: Genevive Bi M.D.   On: 05/02/2023 16:32   VAS [place] IVC/ILIAC (VENOUS ONLY)  Result Date: 05/02/2023 IVC/ILIAC STUDY Patient Name:  Jared [name]  Date of Exam:   05/02/2023 Medical Rec #: [id number]     Accession #:    [id number] Date of Birth: [birth year]      Patient Gender: M Patient Age:   16 years Exam Location:  [facility] Procedure:      VAS [place] IVC/ILIAC (VENOUS ONLY) Referring Phys: Jonah Blue --------------------------------------------------------------------------------  Indications: A vague density area of concern in the IVC was noted on echo exam. Limitations: Air/bowel gas and Status post appendectomy laparoscopic 05/01/23.  Performing Technologist: Marilynne Halsted RDMS, RVT  Examination Guidelines: A complete evaluation includes B-mode imaging, spectral Doppler, color Doppler, and power Doppler as needed of all accessible portions of each vessel. Bilateral testing is considered an integral part of a complete examination. Limited examinations for reoccurring indications may be performed as noted.  IVC/Iliac Findings: +----------+------+--------+-----------------+    IVC    PatentThrombus    Comments       +----------+------+--------+-----------------+ IVC Prox                poorly visualized +----------+------+--------+-----------------+ IVC Mid                 poorly visualized +----------+------+--------+-----------------+ IVC Distal              not visualized    +----------+------+--------+-----------------+    Summary: IVC/Iliac: Visualization of proximal Inferior Vena Cava, mid inferior vena cava and distal Inferior Vena Cava was limited. 2D imaging with color evaluation of the IVC was non- diagnostic due to patient's body habitus and air in the abdominal cavity.  *See table(s) above for measurements and observations.  Electronically signed by Carolynn Sayers on 05/02/2023 at 4:10:22 PM.   Final    ECHOCARDIOGRAM COMPLETE  Result Date: 05/02/2023    ECHOCARDIOGRAM REPORT   Patient Name:   Jared [name] Date of Exam: 05/02/2023 Medical Rec #:  [id number]    Height:       75.0 in Accession #:    [id number]   Weight:       219.0 lb Date of Birth:  [birth year]     BSA:          2.281 m Patient Age:    67 years     BP:           107/69 mmHg Patient Gender: M            HR:           104 bpm. Exam Location:  Inpatient Procedure: 2D Echo, Color Doppler,  Cardiac Doppler and Intracardiac            Opacification Agent Indications:    R55 Syncope  History:        Patient has prior history of Echocardiogram examinations, most                 recent 12/13/2021. CAD, COPD, Arrythmias:Atrial Fibrillation; Risk                 Factors:Hypertension and Diabetes.  Sonographer:    Irving Burton Senior RDCS Referring Phys: [id number] Francena Hanly [facility]  Sonographer Comments: Very poor echo windows due to body habitus and COPD IMPRESSIONS  1. Left ventricular ejection fraction, by estimation, is 55 to 60%. The left ventricle has normal function. The left ventricle has no regional wall motion abnormalities. Left ventricular diastolic parameters are consistent with Grade II diastolic dysfunction (pseudonormalization).  2. Right  ventricular systolic function is moderately reduced. The right ventricular size is moderately enlarged. Tricuspid regurgitation signal is inadequate for assessing PA pressure.  3. Left atrial size was mildly dilated.  4. The mitral valve is degenerative. No evidence of mitral valve regurgitation. No evidence of mitral stenosis.  5. The aortic valve is tricuspid. Aortic valve regurgitation is not visualized. Aortic valve sclerosis/calcification is present, without any evidence of aortic stenosis.  6. There is a vague density in the IVC. Cannot rule out thrombus. Recommend dedicated Abdominal CTA vs. Abdominal US for further evaluation. The inferior vena cava is dilated in size with >50% respiratory variability, suggesting right atrial pressure of  8 mmHg.  7. In setting of syncope as well as RV dysfunction, consider acute PE. Recommend Chest CTA if clinically indicated. FINDINGS  Left Ventricle: Left ventricular ejection fraction, by estimation, is 55 to 60%. The left ventricle has normal function. The left ventricle has no regional wall motion abnormalities. Definity contrast agent was given IV to delineate the left ventricular  endocardial borders. The left ventricular internal cavity size was normal in size. There is no left ventricular hypertrophy. Left ventricular diastolic parameters are consistent with Grade II diastolic dysfunction (pseudonormalization). Normal left ventricular filling pressure. Right Ventricle: The right ventricular size is moderately enlarged. No increase in right ventricular wall thickness. Right ventricular systolic function is moderately reduced. Tricuspid regurgitation signal is inadequate for assessing PA pressure. Left Atrium: Left atrial size was mildly dilated. Right Atrium: Right atrial size was normal in size. Pericardium: There is no evidence of pericardial effusion. Presence of epicardial fat layer. Mitral Valve: The mitral valve is degenerative in appearance. There is mild  calcification of the mitral valve leaflet(s). Mild to moderate mitral annular calcification. No evidence of mitral valve regurgitation. No evidence of mitral valve stenosis. Tricuspid Valve: The tricuspid valve is normal in structure. Tricuspid valve regurgitation is trivial. No evidence of tricuspid stenosis. Aortic Valve: The aortic valve is tricuspid. Aortic valve regurgitation is not visualized. Aortic valve sclerosis/calcification is present, without any evidence of aortic stenosis. Pulmonic Valve: The pulmonic valve was normal in structure. Pulmonic valve regurgitation is not visualized. No evidence of pulmonic stenosis. Aorta: The aortic root is normal in size and structure. Venous: There is a vague density in the IVC. Cannot rule out thrombus. Recommend dedicated Abdominal CTA vs. Abdominal US for further evaluation. The inferior vena cava is dilated in size with greater than 50% respiratory variability, suggesting right atrial pressure of 8 mmHg. IAS/Shunts: No atrial level shunt detected by color flow Doppler.  LEFT VENTRICLE PLAX 2D LVIDd:  4.90 cm     Diastology LVIDs:         3.80 cm     LV e' medial:    6.20 cm/s LV PW:         1.10 cm     LV E/e' medial:  10.9 LV IVS:        1.00 cm     LV e' lateral:   6.31 cm/s LVOT diam:     2.10 cm     LV E/e' lateral: 10.7 LV SV:         47 LV SV Index:   21 LVOT Area:     3.46 cm  LV Volumes (MOD) LV vol d, MOD A4C: 98.9 ml LV vol s, MOD A4C: 57.8 ml LV SV MOD A4C:     98.9 ml RIGHT VENTRICLE RV S prime:     9.36 cm/s TAPSE (M-mode): 1.9 cm LEFT ATRIUM             Index        RIGHT ATRIUM           Index LA diam:        3.30 cm 1.45 cm/m   RA Area:     23.70 cm LA Vol (A2C):   80.6 ml 35.34 ml/m  RA Volume:   71.30 ml  31.26 ml/m LA Vol (A4C):   74.4 ml 32.62 ml/m LA Biplane Vol: 81.7 ml 35.82 ml/m  AORTIC VALVE LVOT Vmax:   88.10 cm/s LVOT Vmean:  65.100 cm/s LVOT VTI:    0.135 m  AORTA Ao Root diam: 3.40 cm MITRAL VALVE MV Area (PHT): 3.39 cm     SHUNTS MV Decel Time: 224 msec    Systemic VTI:  0.14 m MV E velocity: 67.30 cm/s  Systemic Diam: 2.10 cm MV A velocity: 60.80 cm/s MV E/A ratio:  1.11 Armanda Magic MD Electronically signed by Armanda Magic MD Signature Date/Time: 05/02/2023/12:22:10 PM    Final     Anti-infectives: Anti-infectives (From admission, onward)    Start     Dose/Rate Route Frequency Ordered Stop   04/30/23 1930  piperacillin-tazobactam (ZOSYN) IVPB 3.375 g        3.375 g 12.5 mL/hr over 240 Minutes Intravenous Every 8 hours 04/30/23 1915 05/05/23 2159   04/30/23 1145  piperacillin-tazobactam (ZOSYN) IVPB 3.375 g        3.375 g 100 mL/hr over 30 Minutes Intravenous  Once 04/30/23 1131 04/30/23 1242       Assessment/Plan: POD 2 lap appy for perforated appendicitis- MW - WBC up and worsening AKI - some bowel function but distended. Continue cld but high risk ileus so if any n/v may need ng/npo -oob/pulm toilet -five days abx postop -drain can likely come out prior to dc   FEN: clears, continue IVF for Aki ID: [email or web address] 11/19> 11/25 VTE: hold xarelto. SCDs. Hep gtt  Appeciate TRH assistance Syncope - echo with density in IVC. Cards consulted. VQ scan neg and [place] non diagnostic COPD/Chronic resp failure - on 2 lpm at baseline HTN Atrial flutter - xarelto held H/o CVA PVD AKI Tobacco use  Eric Form, [facility] 05/03/2023, 10:31 AM Please see Amion for pager number during day hours 7:00am-4:30pm

## 2023-05-03 NOTE — Progress Notes (Addendum)
Patient Name: Jared [name] Date of Encounter: 05/03/2023 [facility] Cardiologist: Dietrich Pates, MD   Interval Summary  .    67 yr old male with PMH of mild CAD, HFrEF with improved LVEF, HTN, PAD, HLD, paroxysmal atrial flutter on Xarleto, CVA, oxygen dependent COPD, active tobacco use, who is admitted for sepsis due to acute perforated appendicitis with microperforation, s/p lap appy 11/20, course complicated by AKI, A fib RVR.   Patient states he feels unchanged, his back is hurting. He does not normally have symptoms for A fib. He denied chest pain. He was on Xarelto PTA. He smokes 1PPD, has COPD.   Vital Signs .    Vitals:   05/03/23 0442 05/03/23 0503 05/03/23 0714 05/03/23 0928  BP: 120/70 132/63 114/67 109/72  Pulse: (!) 104 79 100 100  Resp: 18 17 16    Temp: 99.1 F (37.3 C) 98.1 F (36.7 C) 99.1 F (37.3 C)   TempSrc: Oral  Oral   SpO2: 93% 98% 93%   Weight:      Height:        Intake/Output Summary (Last 24 hours) at 05/03/2023 1148 Last data filed at 05/03/2023 0530 Gross per 24 hour  Intake 747.29 ml  Output 600 ml  Net 147.29 ml      05/01/2023    1:34 PM 04/30/2023   11:14 PM 04/24/2023   10:40 AM  Last 3 Weights  Weight (lbs) 219 lb 219 lb 2.2 oz 219 lb 12.8 oz  Weight (kg) 99.338 kg 99.4 kg 99.701 kg      Telemetry/ECG    Sinus tachycardia 100s predominantly, intermittent /transient AT versus A fib RVR noted over the past 24 hours - Personally Reviewed  Physical Exam .   GEN: No acute distress.   Neck: No JVD Cardiac: RRR, no murmurs, rubs, or gallops.  Respiratory: Clear to auscultation bilaterally. GI: abdominal soft   MS: No edema  Assessment & Plan .     Paroxysmal A fib/flutter with RVR - In and out of AT versus A fib RVR, sinus tachycardia predominately, self limiting episodes  - PTA Xarelto held  Now on heparin  - continue PTA metoprolol XL 50mg  daily for rate control, may up-tritiate if needed, although would treat  AKI/infection first   HFrEF with improved LVEF RV dysfunction new this admission  - Echo yesterday showed LVEF improved to 55-60% , no RWMA, grade II DD, RV mod reduced, RV mod enlarged, mild LAE, There is a vague density in the IVC. Cannot rule out thrombus.  - VQ scan negative for PE  - he is euvolemic on exam at this time  - GDMT: continue Toprol XL, hold off additional agents given AKI and post-op   Perforated appendicitis  AKI Anemia  Syncope  Intermittent involuntary movement of arms and legs  COPD Hx of CVA - per primary team    For questions or updates, please contact  [facility] Please consult [email or web address] for contact info under        Signed, Cyndi Bender, NP   Patient seen and examined   Agree with findings as noted above by Jared [name] Pt more comfortable today Lungs are relatively clear Cardiac RRR  No murmurs  ABd Distended    Ext without edema  PAF  Pt continues to have SR with intermitt afib, PAT\ ON IV heparin    RV dysfunction  Pt had echo yesterday mod RV dysfunction that was new   Also a susp  density in IVC   VQ scan negative  Keep on heparin for now     Syncope   Pt has had multiple episodes   SOme recent (not unexpected prior to perf appendix)   He is a very difficult historian, difficult to get questions answered now  WIll continue to pursue in future  For now keep on tele  CAD  Mild on cath in 2012  Myoview normal in 2016  No CP      Cardiology team will be available as needed   Plan to see again on Monday  Please call with questions   Dietrich Pates mD

## 2023-05-03 NOTE — TOC Progression Note (Signed)
Transition of Care (TOC) - Progression Note   Patient wanting information on how to file a claim for [facility] water contamination . NCM googled same and provided patient with email address, phone number , create a portal with account on [employer] Claims [employer]. , submit a claim by mail to the Office of the [employer]'s Tort Claims Unit in [facility]  Patient Details  Name: Jared [name] MRN: [id number] Date of Birth: 1955-08-12  Transition of Care [facility]) CM/SW Contact  Takari Lundahl, Adria Devon, RN Phone Number: 05/03/2023, 9:37 AM  Clinical Narrative:       Expected Discharge Plan: Home/Self Care Barriers to Discharge: Continued Medical Work up  Expected Discharge Plan and Services   Discharge Planning Services: CM Consult Post Acute Care Choice: NA Living arrangements for the past 2 months: Boarding House                 DME Arranged: N/A         HH Arranged: NA           Social Determinants of Health (SDOH) Interventions SDOH Screenings   Food Insecurity: No Food Insecurity (04/30/2023)  Housing: Low Risk  (04/30/2023)  Transportation Needs: No Transportation Needs (04/30/2023)  Utilities: Not At Risk (04/30/2023)  Alcohol Screen: Low Risk  (02/06/2023)  Depression (PHQ2-9): Low Risk  (02/04/2023)  Recent Concern: Depression (PHQ2-9) - High Risk (01/09/2023)  Financial Resource Strain: Medium Risk (02/06/2023)  Physical Activity: Insufficiently Active (02/06/2023)  Social Connections: Moderately Integrated (02/06/2023)  Stress: No Stress Concern Present (02/06/2023)  Tobacco Use: High Risk (05/01/2023)  Health Literacy: Adequate Health Literacy (02/06/2023)    Readmission Risk Interventions    12/27/2022    2:20 PM 12/14/2021    1:38 PM 02/01/2021    3:22 PM  Readmission Risk Prevention Plan  Post Dischage Appt Complete Complete   Medication Screening Complete Complete   Transportation Screening Complete Complete Complete  PCP  or Specialist Appt within 5-7 Days   Complete  Home Care Screening   Complete  Medication Review (RN CM)   Complete

## 2023-05-04 ENCOUNTER — Inpatient Hospital Stay (HOSPITAL_COMMUNITY): Payer: Medicare HMO

## 2023-05-04 DIAGNOSIS — R6520 Severe sepsis without septic shock: Secondary | ICD-10-CM

## 2023-05-04 DIAGNOSIS — K3532 Acute appendicitis with perforation and localized peritonitis, without abscess: Secondary | ICD-10-CM | POA: Diagnosis not present

## 2023-05-04 LAB — GLUCOSE, CAPILLARY
Glucose-Capillary: 154 mg/dL — ABNORMAL HIGH (ref 70–99)
Glucose-Capillary: 138 mg/dL — ABNORMAL HIGH (ref 70–99)
Glucose-Capillary: 131 mg/dL — ABNORMAL HIGH (ref 70–99)
Glucose-Capillary: 134 mg/dL — ABNORMAL HIGH (ref 70–99)

## 2023-05-04 LAB — HEPARIN LEVEL (UNFRACTIONATED)
Heparin Unfractionated: 0.33 [IU]/mL (ref 0.30–0.70)
Heparin Unfractionated: 0.21 [IU]/mL — ABNORMAL LOW (ref 0.30–0.70)

## 2023-05-04 LAB — BASIC METABOLIC PANEL
Anion gap: 10 (ref 5–15)
BUN: 19 mg/dL (ref 8–23)
CO2: 29 mmol/L (ref 22–32)
Calcium: 8.3 mg/dL — ABNORMAL LOW (ref 8.9–10.3)
Chloride: 92 mmol/L — ABNORMAL LOW (ref 98–111)
Creatinine, Ser: 1.44 mg/dL — ABNORMAL HIGH (ref 0.61–1.24)
GFR, Estimated: 53 mL/min — ABNORMAL LOW (ref >60)
Glucose, Bld: 150 mg/dL — ABNORMAL HIGH (ref 70–99)
Potassium: 3.9 mmol/L (ref 3.5–5.1)
Sodium: 131 mmol/L — ABNORMAL LOW (ref 135–145)

## 2023-05-04 LAB — CBC
HCT: 27.9 % — ABNORMAL LOW (ref 39.0–52.0)
Hemoglobin: 8.2 g/dL — ABNORMAL LOW (ref 13.0–17.0)
MCH: 26.3 pg (ref 26.0–34.0)
MCHC: 29.4 g/dL — ABNORMAL LOW (ref 30.0–36.0)
MCV: 89.4 fL (ref 80.0–100.0)
Platelets: 375 10*3/uL (ref 150–400)
RBC: 3.12 MIL/uL — ABNORMAL LOW (ref 4.22–5.81)
RDW: 16.6 % — ABNORMAL HIGH (ref 11.5–15.5)
WBC: 14.5 10*3/uL — ABNORMAL HIGH (ref 4.0–10.5)
nRBC: 0 % (ref 0.0–0.2)

## 2023-05-04 LAB — ALBUMIN: Albumin: 2.2 g/dL — ABNORMAL LOW (ref 3.5–5.0)

## 2023-05-04 MED ORDER — IOHEXOL 300 MG/ML  SOLN
30.0000 mL | Freq: Once | INTRAMUSCULAR | Status: AC | PRN
  Administered 2023-05-04: 30 mL via ORAL

## 2023-05-04 NOTE — Progress Notes (Signed)
[facility] NEPHROLOGY PROGRESS NOTE  Assessment/ Plan: Pt is a 67 y.o. yo male  with past medical history significant for HTN, HLD, PAD, paroxysmal atrial flutter on anticoagulation, stroke, COPD on oxygen, CAD, CHF with preserved EF who was admitted for sepsis due to acute perforated appendicitis with microperforation status post lap appendectomy on 11/20, seen as a consultation for further evaluation of acute kidney injury.   # Acute kidney injury likely prerenal etiology concomitant with sepsis due to perforated appendix required surgical intervention.  UA is clear and kidney ultrasound without hydronephrosis.  Serum creatinine level significantly improved with IV fluid.  Recommend to DC IV fluid once he is able to take orally.  Continue to avoid hypotensive episode and nephrotoxins.   # Sepsis due to acute perforated appendicitis with microperforation status post laparoscopic appendectomy on 11/20.  Currently on antibiotics and has abdomen drain.  Management per surgery team.   # Hyponatremia: On IV fluid.  Urine sodium less than 10.  Monitor lab.  # Tremors, syncope: Neurology is following, noted getting EEG.   # Hypertension: Monitor blood pressure, currently on cardiac medication.  Avoid hypotensive episode.  Sign off, please call us back questions.  Subjective: Seen and examined at bedside.  He denies nausea, vomiting, chest pain, shortness of breath.  Lab improved with IV fluid.  No new event. Objective Vital signs in last 24 hours: Vitals:   05/04/23 0331 05/04/23 0749 05/04/23 0801 05/04/23 0802  BP: 130/75 120/70    Pulse: 92 86    Resp: 19 17    Temp: 98 F (36.7 C) (!) 97.5 F (36.4 C)    TempSrc: Oral Oral    SpO2: 93% 96% 95% 96%  Weight:      Height:       Weight change:   Intake/Output Summary (Last 24 hours) at 05/04/2023 1057 Last data filed at 05/04/2023 0750 Gross per 24 hour  Intake 631.09 ml  Output 640 ml  Net -8.91 ml        Labs: RENAL PANEL Recent Labs  Lab 04/30/23 1027 04/30/23 1144 05/01/23 0749 05/02/23 0755 05/03/23 0657 05/03/23 1523 05/04/23 0550  NA 135  --  135 135 131*  --  131*  K 3.6  --  3.3* 4.3 4.2  --  3.9  CL 98  --  96* 96* 94*  --  92*  CO2 27  --  29 30 31   --  29  GLUCOSE 129*  --  98 193* 148*  --  150*  BUN 12  --  8 14 23   --  19  CREATININE 1.65*  --  0.97 1.61* 1.93*  --  1.44*  CALCIUM 8.5*  --  8.2* 8.4* 8.1*  --  8.3*  MG  --   --   --   --   --  2.2  --   ALBUMIN  --  3.7  --  2.7*  --  2.4* 2.2*    Liver Function Tests: Recent Labs  Lab 04/30/23 1144 05/02/23 0755 05/03/23 1523 05/04/23 0550  AST 32 21 36  --   ALT 18 14 16   --   ALKPHOS 72 57 100  --   BILITOT 1.3* 0.4 0.5  --   PROT 7.3 6.0* 6.1*  --   ALBUMIN 3.7 2.7* 2.4* 2.2*   No results for input(s): "LIPASE", "AMYLASE" in the last 168 hours. Recent Labs  Lab 05/03/23 1339  AMMONIA 29   CBC: Recent  Labs    10/25/22 1027 12/07/22 0757 12/26/22 1251 12/26/22 2345 01/28/23 1335 02/25/23 0938 03/25/23 0920 04/22/23 0919 04/30/23 1027 05/01/23 0749 05/02/23 0755 05/03/23 0657 05/04/23 0550  HGB 11.5*   < >  --    < > 10.6* 10.6* 10.7* 10.6* 10.1* 8.8* 9.6* 8.8* 8.2*  MCV 90   < >  --    < > 87.7 92.5 93.2 93.5 90.8 89.6 91.0 91.0 89.4  VITAMINB12 946  --  718  --   --   --   --   --   --   --   --   --   --   FOLATE  --   --  26.0  --   --   --   --   --   --   --   --   --   --   FERRITIN  --   --  3*  --  10* 77 22* 38  --   --   --   --   --   TIBC  --   --  441  --  367 349 344 350  --   --   --   --   --   IRON  --   --  5*  --  29* 82 25* 64  --   --   --   --   --   RETICCTPCT  --   --  1.2  --   --   --   --   --   --   --   --   --   --    < > = values in this interval not displayed.    Cardiac Enzymes: No results for input(s): "CKTOTAL", "CKMB", "CKMBINDEX", "TROPONINI" in the last 168 hours. CBG: Recent Labs  Lab 05/03/23 0742 05/03/23 1227 05/03/23 1715  05/03/23 2117 05/04/23 0747  GLUCAP 145* 129* 141* 201* 154*    Iron Studies: No results for input(s): "IRON", "TIBC", "TRANSFERRIN", "FERRITIN" in the last 72 hours. Studies/Results: VAS [place] LOWER EXTREMITY VENOUS (DVT)  Result Date: 05/03/2023  Lower Venous DVT Study Patient Name:  YANICK SYLTE  Date of Exam:   05/03/2023 Medical Rec #: [id number]     Accession #:    [id number] Date of Birth: [birth year]      Patient Gender: M Patient Age:   63 years Exam Location:  [facility] Procedure:      VAS [place] LOWER EXTREMITY VENOUS (DVT) Referring Phys: Zenia Resides --------------------------------------------------------------------------------  Indications: Swelling, and Edema.  Risk Factors: Surgery Appendectomy 05/01/23. Comparison Study: No prior study Performing Technologist: Shona Simpson  Examination Guidelines: A complete evaluation includes B-mode imaging, spectral Doppler, color Doppler, and power Doppler as needed of all accessible portions of each vessel. Bilateral testing is considered an integral part of a complete examination. Limited examinations for reoccurring indications may be performed as noted. The reflux portion of the exam is performed with the patient in reverse Trendelenburg.  +---------+---------------+---------+-----------+----------+--------------+ RIGHT    CompressibilityPhasicitySpontaneityPropertiesThrombus Aging +---------+---------------+---------+-----------+----------+--------------+ CFV      Full           Yes      Yes                                 +---------+---------------+---------+-----------+----------+--------------+ SFJ      Full                                                        +---------+---------------+---------+-----------+----------+--------------+  FV Prox  Full                                                        +---------+---------------+---------+-----------+----------+--------------+ FV Mid   Full                                                         +---------+---------------+---------+-----------+----------+--------------+ FV DistalFull                                                        +---------+---------------+---------+-----------+----------+--------------+ PFV      Full                                                        +---------+---------------+---------+-----------+----------+--------------+ POP      Full           Yes      Yes                                 +---------+---------------+---------+-----------+----------+--------------+ PTV      Full                                                        +---------+---------------+---------+-----------+----------+--------------+ PERO     Full                                                        +---------+---------------+---------+-----------+----------+--------------+   +---------+---------------+---------+-----------+----------+--------------+ LEFT     CompressibilityPhasicitySpontaneityPropertiesThrombus Aging +---------+---------------+---------+-----------+----------+--------------+ CFV      Full           Yes      Yes                                 +---------+---------------+---------+-----------+----------+--------------+ SFJ      Full                                                        +---------+---------------+---------+-----------+----------+--------------+ FV Prox  Full                                                        +---------+---------------+---------+-----------+----------+--------------+  FV Mid   Full                                                        +---------+---------------+---------+-----------+----------+--------------+ FV DistalFull                                                        +---------+---------------+---------+-----------+----------+--------------+ PFV      Full                                                         +---------+---------------+---------+-----------+----------+--------------+ POP      Full           Yes      Yes                                 +---------+---------------+---------+-----------+----------+--------------+ PTV      Full                                                        +---------+---------------+---------+-----------+----------+--------------+ PERO     Full                                                        +---------+---------------+---------+-----------+----------+--------------+     Summary: BILATERAL: - No evidence of deep vein thrombosis seen in the lower extremities, bilaterally. -No evidence of popliteal cyst, bilaterally.   *See table(s) above for measurements and observations. Electronically signed by Gerarda Fraction on 05/03/2023 at 3:40:10 PM.    Final    Overnight EEG with video  Result Date: 05/03/2023 Charlsie Quest, MD     05/03/2023  7:38 PM Patient Name: Bric Rardin MRN: [id number] Epilepsy Attending: Charlsie Quest Referring Physician/Provider: Gevena Mart, NP Duration: 05/02/2023 1937 to 05/03/2023 1550 Patient history: 67 yo M with arm tremors, hx of tremors with LOC, frequency increased in the last 3 months, had episode today without LOC. EEG to evaluate for seizure. Level of alertness: Awake, asleep AEDs during EEG study: GBP Technical aspects: This EEG study was done with scalp electrodes positioned according to the 10-20 International system of electrode placement. Electrical activity was reviewed with band pass filter of 1-70Hz , sensitivity of 7 uV/mm, display speed of 34mm/sec with a 60Hz  notched filter applied as appropriate. EEG data were recorded continuously and digitally stored.  Video monitoring was available and reviewed as appropriate. Description: The posterior dominant rhythm consists of 8 Hz activity of moderate voltage (25-35 uV) seen predominantly in posterior head regions, symmetric and reactive to eye opening and eye  closing. Sleep was characterized by vertex waves, sleep spindles (  12 to 14 Hz), maximal frontocentral region.  Event button was pressed over 50 times after 05/03/2023 0416 for tremors of the upper extremity. Concomitant EEG before, during and after the event showed normal posterior dominant rhythm, did not show any EEG changes suggest seizure. Hyperventilation and photic stimulation were not performed.   IMPRESSION: This study is within normal limits. No seizures or epileptiform discharges were seen throughout the recording. Event button was pressed several times during the study for tremors of upper extremity without concomitant EEG change.  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 Charlsie Quest   NM Pulmonary Perf and Vent  Result Date: 05/02/2023 CLINICAL DATA:  Evaluate for pulmonary embolism. EXAM: NUCLEAR MEDICINE PERFUSION LUNG SCAN TECHNIQUE: Perfusion images were obtained in multiple projections after intravenous injection of radiopharmaceutical. Ventilation scans intentionally deferred if perfusion scan and chest x-ray adequate for interpretation during COVID 19 epidemic. RADIOPHARMACEUTICALS:  4 mCi Tc-79m MAA IV COMPARISON:  Chest radiograph-earlier same day; 04/30/2023 Chest CT-07/12/2022 FINDINGS: Review of chest radiograph performed earlier same day demonstrates unchanged enlarged cardiac silhouette and mediastinal contours. Old left-sided rib fractures. No focal airspace opacities. No pleural effusion or pneumothorax. No evidence of edema Perfusion imaging demonstrates mild heterogeneous perfusion of the bilateral pulmonary parenchyma without discrete area of non perfusion to suggest pulmonary embolism. IMPRESSION: Pulmonary embolism absent. No discrete areas of non perfusion to suggest pulmonary embolism. Electronically Signed   By: Simonne Come M.D.   On: 05/02/2023 17:35   DG CHEST PORT 1 VIEW  Result Date: 05/02/2023 CLINICAL DATA:  Dyspnea EXAM: PORTABLE CHEST 1 VIEW COMPARISON:  None  Available. FINDINGS: Normal mediastinum and cardiac silhouette. Normal pulmonary vasculature. No evidence of effusion, infiltrate, or pneumothorax. No acute bony abnormality. IMPRESSION: Normal chest radiograph Electronically Signed   By: Genevive Bi M.D.   On: 05/02/2023 16:32   VAS [place] IVC/ILIAC (VENOUS ONLY)  Result Date: 05/02/2023 IVC/ILIAC STUDY Patient Name:  ADEMIDE MOREAU  Date of Exam:   05/02/2023 Medical Rec #: [id number]     Accession #:    [id number] Date of Birth: 03-Feb-1956      Patient Gender: M Patient Age:   23 years Exam Location:  [facility] Procedure:      VAS [place] IVC/ILIAC (VENOUS ONLY) Referring Phys: Jonah Blue --------------------------------------------------------------------------------  Indications: A vague density area of concern in the IVC was noted on echo exam. Limitations: Air/bowel gas and Status post appendectomy laparoscopic 05/01/23.  Performing Technologist: Marilynne Halsted RDMS, RVT  Examination Guidelines: A complete evaluation includes B-mode imaging, spectral Doppler, color Doppler, and power Doppler as needed of all accessible portions of each vessel. Bilateral testing is considered an integral part of a complete examination. Limited examinations for reoccurring indications may be performed as noted.  IVC/Iliac Findings: +----------+------+--------+-----------------+    IVC    PatentThrombus    Comments      +----------+------+--------+-----------------+ IVC Prox                poorly visualized +----------+------+--------+-----------------+ IVC Mid                 poorly visualized +----------+------+--------+-----------------+ IVC Distal              not visualized    +----------+------+--------+-----------------+    Summary: IVC/Iliac: Visualization of proximal Inferior Vena Cava, mid inferior vena cava and distal Inferior Vena Cava was limited. 2D imaging with color evaluation of the IVC was non- diagnostic due to patient's body  habitus and air in the abdominal cavity.  *  See table(s) above for measurements and observations.  Electronically signed by Carolynn Sayers on 05/02/2023 at 4:10:22 PM.   Final    ECHOCARDIOGRAM COMPLETE  Result Date: 05/02/2023    ECHOCARDIOGRAM REPORT   Patient Name:   TEX HEIMER Date of Exam: 05/02/2023 Medical Rec #:  [id number]    Height:       75.0 in Accession #:    [id number]   Weight:       219.0 lb Date of Birth:  25-Sep-1955     BSA:          2.281 m Patient Age:    67 years     BP:           107/69 mmHg Patient Gender: M            HR:           104 bpm. Exam Location:  Inpatient Procedure: 2D Echo, Color Doppler, Cardiac Doppler and Intracardiac            Opacification Agent Indications:    R55 Syncope  History:        Patient has prior history of Echocardiogram examinations, most                 recent 12/13/2021. CAD, COPD, Arrythmias:Atrial Fibrillation; Risk                 Factors:Hypertension and Diabetes.  Sonographer:    Irving Burton Senior RDCS Referring Phys: [id number] Francena Hanly [facility]  Sonographer Comments: Very poor echo windows due to body habitus and COPD IMPRESSIONS  1. Left ventricular ejection fraction, by estimation, is 55 to 60%. The left ventricle has normal function. The left ventricle has no regional wall motion abnormalities. Left ventricular diastolic parameters are consistent with Grade II diastolic dysfunction (pseudonormalization).  2. Right ventricular systolic function is moderately reduced. The right ventricular size is moderately enlarged. Tricuspid regurgitation signal is inadequate for assessing PA pressure.  3. Left atrial size was mildly dilated.  4. The mitral valve is degenerative. No evidence of mitral valve regurgitation. No evidence of mitral stenosis.  5. The aortic valve is tricuspid. Aortic valve regurgitation is not visualized. Aortic valve sclerosis/calcification is present, without any evidence of aortic stenosis.  6. There is a vague density in the IVC. Cannot rule  out thrombus. Recommend dedicated Abdominal CTA vs. Abdominal US for further evaluation. The inferior vena cava is dilated in size with >50% respiratory variability, suggesting right atrial pressure of  8 mmHg.  7. In setting of syncope as well as RV dysfunction, consider acute PE. Recommend Chest CTA if clinically indicated. FINDINGS  Left Ventricle: Left ventricular ejection fraction, by estimation, is 55 to 60%. The left ventricle has normal function. The left ventricle has no regional wall motion abnormalities. Definity contrast agent was given IV to delineate the left ventricular  endocardial borders. The left ventricular internal cavity size was normal in size. There is no left ventricular hypertrophy. Left ventricular diastolic parameters are consistent with Grade II diastolic dysfunction (pseudonormalization). Normal left ventricular filling pressure. Right Ventricle: The right ventricular size is moderately enlarged. No increase in right ventricular wall thickness. Right ventricular systolic function is moderately reduced. Tricuspid regurgitation signal is inadequate for assessing PA pressure. Left Atrium: Left atrial size was mildly dilated. Right Atrium: Right atrial size was normal in size. Pericardium: There is no evidence of pericardial effusion. Presence of epicardial fat layer. Mitral Valve: The mitral valve is degenerative in appearance. There is  mild calcification of the mitral valve leaflet(s). Mild to moderate mitral annular calcification. No evidence of mitral valve regurgitation. No evidence of mitral valve stenosis. Tricuspid Valve: The tricuspid valve is normal in structure. Tricuspid valve regurgitation is trivial. No evidence of tricuspid stenosis. Aortic Valve: The aortic valve is tricuspid. Aortic valve regurgitation is not visualized. Aortic valve sclerosis/calcification is present, without any evidence of aortic stenosis. Pulmonic Valve: The pulmonic valve was normal in structure.  Pulmonic valve regurgitation is not visualized. No evidence of pulmonic stenosis. Aorta: The aortic root is normal in size and structure. Venous: There is a vague density in the IVC. Cannot rule out thrombus. Recommend dedicated Abdominal CTA vs. Abdominal US for further evaluation. The inferior vena cava is dilated in size with greater than 50% respiratory variability, suggesting right atrial pressure of 8 mmHg. IAS/Shunts: No atrial level shunt detected by color flow Doppler.  LEFT VENTRICLE PLAX 2D LVIDd:         4.90 cm     Diastology LVIDs:         3.80 cm     LV e' medial:    6.20 cm/s LV PW:         1.10 cm     LV E/e' medial:  10.9 LV IVS:        1.00 cm     LV e' lateral:   6.31 cm/s LVOT diam:     2.10 cm     LV E/e' lateral: 10.7 LV SV:         47 LV SV Index:   21 LVOT Area:     3.46 cm  LV Volumes (MOD) LV vol d, MOD A4C: 98.9 ml LV vol s, MOD A4C: 57.8 ml LV SV MOD A4C:     98.9 ml RIGHT VENTRICLE RV S prime:     9.36 cm/s TAPSE (M-mode): 1.9 cm LEFT ATRIUM             Index        RIGHT ATRIUM           Index LA diam:        3.30 cm 1.45 cm/m   RA Area:     23.70 cm LA Vol (A2C):   80.6 ml 35.34 ml/m  RA Volume:   71.30 ml  31.26 ml/m LA Vol (A4C):   74.4 ml 32.62 ml/m LA Biplane Vol: 81.7 ml 35.82 ml/m  AORTIC VALVE LVOT Vmax:   88.10 cm/s LVOT Vmean:  65.100 cm/s LVOT VTI:    0.135 m  AORTA Ao Root diam: 3.40 cm MITRAL VALVE MV Area (PHT): 3.39 cm    SHUNTS MV Decel Time: 224 msec    Systemic VTI:  0.14 m MV E velocity: 67.30 cm/s  Systemic Diam: 2.10 cm MV A velocity: 60.80 cm/s MV E/A ratio:  1.11 Armanda Magic MD Electronically signed by Armanda Magic MD Signature Date/Time: 05/02/2023/12:22:10 PM    Final     Medications: Infusions:  heparin 1,700 Units/hr (05/04/23 0739)   lactated ringers 75 mL/hr at 05/04/23 0742   piperacillin-tazobactam (ZOSYN)  IV 3.375 g (05/04/23 0522)    Scheduled Medications:  acetaminophen  1,000 mg Oral Q6H   arformoterol  15 mcg Nebulization BID    aspirin EC  81 mg Oral Daily   atorvastatin  80 mg Oral Daily   budesonide (PULMICORT) nebulizer solution  0.5 mg Nebulization BID   DULoxetine  60 mg Oral Daily   ezetimibe  10 mg  Oral Daily   feeding supplement  1 Container Oral TID BM   ferrous sulfate  325 mg Oral Q breakfast   folic acid  1 mg Oral Daily   gabapentin  300 mg Oral BID   hydrALAZINE  50 mg Oral Q8H   isosorbide mononitrate  30 mg Oral Daily   metoprolol succinate  50 mg Oral Daily   multivitamin with minerals  1 tablet Oral Daily   nicotine  21 mg Transdermal Daily   pantoprazole  40 mg Oral Daily   revefenacin  175 mcg Nebulization Daily    have reviewed scheduled and prn medications.  Physical Exam: General:NAD, comfortable Heart:RRR, s1s2 nl Lungs:clear b/l, no crackle Abdomen:soft, Non-tender, abdomen drain present. Extremities:No edema Neurology: Alert, awake and following commands  Mikya Don Jaynie Collins 05/04/2023,10:57 AM  LOS: 4 days

## 2023-05-04 NOTE — Progress Notes (Signed)
PHARMACY - ANTICOAGULATION CONSULT NOTE  Pharmacy Consult for heparin  Indication: pulmonary embolus, suspected  Allergies  Allergen Reactions   Lisinopril Cough    Patient Measurements: Height: 6\' 3"  (190.5 cm) Weight: 99.3 kg (219 lb) IBW/kg (Calculated) : 84.5 Heparin Dosing Weight: 99.3 kg   Vital Signs: Temp: 98 F (36.7 C) (11/23 0331) Temp Source: Oral (11/23 0331) BP: 130/75 (11/23 0331) Pulse Rate: 92 (11/23 0331)  Labs: Recent Labs    05/02/23 0755 05/02/23 1717 05/02/23 1718 05/02/23 1906 05/03/23 0657 05/04/23 0550  HGB 9.6*  --   --   --  8.8* 8.2*  HCT 33.2*  --   --   --  30.5* 27.9*  PLT 355  --   --   --  359 375  HEPARINUNFRC  --   --   --  0.65 0.62 0.33  CREATININE 1.61*  --   --   --  1.93* 1.44*  TROPONINIHS  --  37* 40*  --   --   --     Estimated Creatinine Clearance: 57.9 mL/min (A) (by C-G formula based on SCr of 1.44 mg/dL (H)).   Medical History: Past Medical History:  Diagnosis Date   AVM (arteriovenous malformation)    CAD (coronary artery disease)    a. LHC 5/12:  LAD 20, pLCx 20, pRCA 40, dRCA 40, EF 35%, diff HK  //  b. Myoview 4/16: Overall Impression:  High risk stress nuclear study There is no evidence of ischemia.  There is severe LV dysfunction. LV Ejection Fraction: 30%.  LV Wall Motion:  There is global LV hypokinesis.     CAP (community acquired pneumonia) 09/2013   Chronic combined systolic and diastolic CHF (congestive heart failure) (HCC)    a. Echo 4/16:Mild LVH, EF 40-45%, diffuse HK //  b. Echo 8/17: EF 35-40%, diffuse HK, diastolic dysfunction, aortic sclerosis, trivial MR, moderate LAE, normal RVSF, moderate RAE, mild TR, PASP 42 mmHg // c. Echo 4/18: Mild concentric LVH, EF 30-35, normal wall motion, grade 1 diastolic dysfunction, PASP 49   Chronic respiratory failure (HCC)    Cluster headache    "hx; haven't had one in awhile" (01/09/2016)   COPD (chronic obstructive pulmonary disease) (HCC)    [name] 01/09/2016    DM (diabetes mellitus) (HCC)    History of CVA (cerebrovascular accident)    Hypertension    IDA (iron deficiency anemia)    Moderate tobacco use disorder    NICM (nonischemic cardiomyopathy) (HCC)    Nicotine addiction    Tobacco abuse    Type 2 diabetes mellitus (HCC) 05/14/2016    Assessment: 67 yo M with suspected PE from ECHO findings. V/Q scan neg for PE. Vague density in the IVC. Cannot rule out thrombus. Pt is s/p appendectomy with drain placement on 11/20.    05/04/23 AM update: HL 0.33- significant drop from 11/22 Hgb 8.2 PLT wnl No signs of bleeding or issues with heparin gtt   Goal of Therapy:  Heparin level 0.3-0.7 units/ml Monitor platelets by anticoagulation protocol: Yes   Plan:  Increase heparin infusion to 1700 units/hr HL 1600 Daily HL, CBC F/u s/sx bleeding, Hgb trend     Milessa Hogan BS, PharmD, BCPS Clinical Pharmacist 05/04/2023 7:31 AM  Contact: [phone] after 3 PM  "Be curious, not judgmental..." -Debbora Dus

## 2023-05-04 NOTE — Evaluation (Signed)
Occupational Therapy Evaluation Patient Details Name: Jared [name] MRN: [id number] DOB: [birth year] Today's Date: 05/04/2023   History of Present Illness 67 y.o. male presents to [facility] 04/30/23 w/ acute L lower abdominal pain, L testicle pain, and h/a. Admitted w/ sepsis 2/2 acute perforated appendicitis s/p laparoscopic appendectomy 11/20. Pt has history of syncope and LOC when standing up. Echo w/ density in IVC, VQ scan negative, EEG study findings within normal limits, negative for LE DVT. PMHx: AVM, chronic respiratory failure (2L O2 at home) CABG, CAD, CAP, CHF, COPD, CVA, HTN, A flutter, DM2   Clinical Impression   Pt presents with decline in function and safety with ADLs and ADL mobility with impaired strength, balance and endurance. PTA pt lived at home alone and was Ind with ADLs/selfcare, IADLs and no AD for mobility in the house but used a RW or cane out in the community. Pt's sister assists with shopping and transportation. Pt currently requires min A with LB ADLs and CGA/Sup with functional mobility and transfers using RW. Pt would benefit from acute OT services to address impairments to maximize level of function and safety      If plan is discharge home, recommend the following: A little help with bathing/dressing/bathroom;A little help with walking and/or transfers;Assistance with cooking/housework;Assist for transportation;Help with stairs or ramp for entrance    Functional Status Assessment  Patient has had a recent decline in their functional status and demonstrates the ability to make significant improvements in function in a reasonable and predictable amount of time.  Equipment Recommendations  Other (comment) (reacher, LH bath sponge)    Recommendations for Other Services       Precautions / Restrictions Precautions Precautions: Fall Precaution Comments: abdominal surgery, open drain L abdomen Restrictions Weight Bearing Restrictions: No      Mobility Bed  Mobility Overal bed mobility: Needs Assistance Bed Mobility: Supine to Sit     Supine to sit: Min assist, HOB elevated, Used rails     General bed mobility comments: Min A for trunk elevation    Transfers Overall transfer level: Needs assistance Equipment used: Rolling walker (2 wheels) Transfers: Sit to/from Stand Sit to Stand: Contact guard assist, Supervision                  Balance Overall balance assessment: Needs assistance, Mild deficits observed, not formally tested Sitting-balance support: No upper extremity supported, Feet supported       Standing balance support: Bilateral upper extremity supported, During functional activity, Reliant on assistive device for balance Standing balance-Leahy Scale: Poor                             ADL either performed or assessed with clinical judgement   ADL Overall ADL's : Needs assistance/impaired Eating/Feeding: Independent;Sitting   Grooming: Wash/dry hands;Wash/dry face;Contact guard assist;Standing   Upper Body Bathing: Supervision/ safety   Lower Body Bathing: Minimal assistance   Upper Body Dressing : Supervision/safety   Lower Body Dressing: Minimal assistance   Toilet Transfer: Contact guard assist;Ambulation;Rolling walker (2 wheels)   Toileting- Clothing Manipulation and Hygiene: Minimal assistance       Functional mobility during ADLs: Contact guard assist;Supervision/safety       Vision Ability to See in Adequate Light: 0 Adequate Patient Visual Report: No change from baseline       Perception         Praxis         Pertinent Vitals/Pain  Pain Assessment Pain Assessment: 0-10 Pain Score: 7  Pain Location: abdomen incision Pain Descriptors / Indicators: Aching, Discomfort Pain Intervention(s): Monitored during session, Repositioned     Extremity/Trunk Assessment Upper Extremity Assessment Upper Extremity Assessment: Overall WFL for tasks assessed   Lower Extremity  Assessment Lower Extremity Assessment: Defer to PT evaluation   Cervical / Trunk Assessment Cervical / Trunk Assessment: Normal   Communication Communication Communication: No apparent difficulties   Cognition Arousal: Alert Behavior During Therapy: WFL for tasks assessed/performed Overall Cognitive Status: Within Functional Limits for tasks assessed                                       General Comments       Exercises     Shoulder Instructions      Home Living Family/patient expects to be discharged to:: Private residence Living Arrangements: Non-relatives/Friends   Type of Home: House Home Access: Stairs to enter Secretary/administrator of Steps: 4 Entrance Stairs-Rails: Can reach both;Left;Right Home Layout: One level     Bathroom Shower/Tub: Chief Strategy Officer: Standard Bathroom Accessibility: Yes   Home Equipment: Rollator (4 wheels);Rolling Walker (2 wheels);Shower seat;Grab bars - tub/shower;BSC/3in1   Additional Comments: 2L O2 all times      Prior Functioning/Environment Prior Level of Function : Independent/Modified Independent;History of Falls (last six months)             Mobility Comments: Rollator in community, no AD in home. Pt reports falls after experiencing syncope. Pt can usually tell when these episodes will happen ADLs Comments: Mod I with ADL/IADLs, sister or medicaid provides transportation        OT Problem List:        OT Treatment/Interventions: Self-care/ADL training;Therapeutic activities;DME and/or AE instruction;Patient/family education;Energy conservation    OT Goals(Current goals can be found in the care plan section) Acute Rehab OT Goals Patient Stated Goal: "go home" OT Goal Formulation: With patient Time For Goal Achievement: 05/18/23 Potential to Achieve Goals: Good ADL Goals Pt Will Perform Grooming: with supervision;with set-up;standing Pt Will Perform Upper Body Bathing: with  set-up Pt Will Perform Lower Body Bathing: with contact guard assist;with supervision;with adaptive equipment Pt Will Perform Upper Body Dressing: with set-up Pt Will Perform Lower Body Dressing: with contact guard assist;with supervision;with adaptive equipment Pt Will Transfer to Toilet: with contact guard assist;with supervision;ambulating;grab bars Pt Will Perform Toileting - Clothing Manipulation and hygiene: with contact guard assist;with supervision;sit to/from stand Pt Will Perform Tub/Shower Transfer: with contact guard assist;with supervision;ambulating;rolling walker;shower seat;grab bars  OT Frequency: Min 2X/week    Co-evaluation              AM-PAC OT "6 Clicks" Daily Activity     Outcome Measure Help from another person eating meals?: None Help from another person taking care of personal grooming?: A Little Help from another person toileting, which includes using toliet, bedpan, or urinal?: A Little Help from another person bathing (including washing, rinsing, drying)?: A Little Help from another person to put on and taking off regular upper body clothing?: A Little Help from another person to put on and taking off regular lower body clothing?: A Little 6 Click Score: 19   End of Session Equipment Utilized During Treatment: Gait belt;Rolling walker (2 wheels)  Activity Tolerance: Patient tolerated treatment well Patient left: in chair;with call bell/phone within reach  OT Visit Diagnosis: Unsteadiness on  feet (R26.81);Other abnormalities of gait and mobility (R26.89);Pain Pain - Right/Left: Left Pain - part of body:  (abdomen incision)                Time: 4098-1191 OT Time Calculation (min): 26 min Charges:  OT General Charges $OT Visit: 1 Visit OT Evaluation $OT Eval Moderate Complexity: 1 Mod OT Treatments $Therapeutic Activity: 8-22 mins    Galen Manila 05/04/2023, 12:15 PM

## 2023-05-04 NOTE — Progress Notes (Signed)
Assessment & Plan: POD#3 - lap appy for perforated appendicitis- MW - WBC 14.5, rising - monitor as may need repeat CT scan - AKI, creatinine improving - tolerating CLD, requests "meat" - will advance to FLD today - oob/pulm toilet; encouraged ambulation in halls - five days abx postop - drain can likely come out prior to dc - currently serous output   FEN: start FLD today, continue IVF for Aki ID: [email or web address] 11/19> 11/25 VTE: hold xarelto. SCDs. Hep gtt   Appeciate TRH assistance Syncope - echo with density in IVC. Cards consulted. VQ scan neg and [place] non diagnostic COPD/Chronic resp failure - on 2 lpm at baseline HTN Atrial flutter - xarelto held H/o CVA PVD AKI Tobacco use        Jared Level, MD [facility] practice Office: [phone]        Chief Complaint: Perforated appendicitis  Subjective: Patient in bed, nurse at bedside.  Wants more to eat.  States passing flatus and had BM (?)  Objective: Vital signs in last 24 hours: Temp:  [97.5 F (36.4 C)-98.8 F (37.1 C)] 97.5 F (36.4 C) (11/23 0749) Pulse Rate:  [86-100] 86 (11/23 0749) Resp:  [16-19] 17 (11/23 0749) BP: (109-130)/(70-75) 120/70 (11/23 0749) SpO2:  [93 %-96 %] 96 % (11/23 0749) Last BM Date : 05/02/23 (per pt)  Intake/Output from previous day: 11/22 0701 - 11/23 0700 In: 631.1 [I.V.:455.1; IV Piggyback:176] Out: 365 [Urine:325; Drains:40] Intake/Output this shift: No intake/output data recorded.  Physical Exam: HEENT - sclerae clear, mucous membranes moist Neck - soft Abdomen - moderate distension, minimal tenderness; JP with serous output  Lab Results:  Recent Labs    05/03/23 0657 05/04/23 0550  WBC 12.9* 14.5*  HGB 8.8* 8.2*  HCT 30.5* 27.9*  PLT 359 375   BMET Recent Labs    05/03/23 0657 05/04/23 0550  NA 131* 131*  K 4.2 3.9  CL 94* 92*  CO2 31 29  GLUCOSE 148* 150*  BUN 23 19  CREATININE 1.93* 1.44*  CALCIUM 8.1* 8.3*   PT/INR No  results for input(s): "LABPROT", "INR" in the last 72 hours. Comprehensive Metabolic Panel:    Component Value Date/Time   NA 131 (L) 05/04/2023 0550   NA 131 (L) 05/03/2023 0657   NA 142 10/25/2022 1027   NA 145 (H) 12/18/2021 1121   K 3.9 05/04/2023 0550   K 4.2 05/03/2023 0657   CL 92 (L) 05/04/2023 0550   CL 94 (L) 05/03/2023 0657   CO2 29 05/04/2023 0550   CO2 31 05/03/2023 0657   BUN 19 05/04/2023 0550   BUN 23 05/03/2023 0657   BUN 14 10/25/2022 1027   BUN 26 12/18/2021 1121   CREATININE 1.44 (H) 05/04/2023 0550   CREATININE 1.93 (H) 05/03/2023 0657   CREATININE 1.20 07/30/2016 1431   CREATININE 1.04 03/02/2016 0813   GLUCOSE 150 (H) 05/04/2023 0550   GLUCOSE 148 (H) 05/03/2023 0657   CALCIUM 8.3 (L) 05/04/2023 0550   CALCIUM 8.1 (L) 05/03/2023 0657   AST 36 05/03/2023 1523   AST 21 05/02/2023 0755   ALT 16 05/03/2023 1523   ALT 14 05/02/2023 0755   ALKPHOS 100 05/03/2023 1523   ALKPHOS 57 05/02/2023 0755   BILITOT 0.5 05/03/2023 1523   BILITOT 0.4 05/02/2023 0755   BILITOT 0.3 10/25/2022 1027   BILITOT 0.3 09/20/2021 1620   PROT 6.1 (L) 05/03/2023 1523   PROT 6.0 (L) 05/02/2023 0755   PROT  6.7 10/25/2022 1027   PROT 7.5 09/20/2021 1620   ALBUMIN 2.2 (L) 05/04/2023 0550   ALBUMIN 2.4 (L) 05/03/2023 1523   ALBUMIN 4.4 10/25/2022 1027   ALBUMIN 5.0 (H) 09/20/2021 1620    Studies/Results: VAS [place] LOWER EXTREMITY VENOUS (DVT)  Result Date: 05/03/2023  Lower Venous DVT Study Patient Name:  Jared [name]  Date of Exam:   05/03/2023 Medical Rec #: [id number]     Accession #:    [id number] Date of Birth: Sep 15, 1955      Patient Gender: M Patient Age:   67 years Exam Location:  [facility] Procedure:      VAS [place] LOWER EXTREMITY VENOUS (DVT) Referring Phys: Zenia Resides --------------------------------------------------------------------------------  Indications: Swelling, and Edema.  Risk Factors: Surgery Appendectomy 05/01/23. Comparison Study: No prior study  Performing Technologist: Shona Simpson  Examination Guidelines: A complete evaluation includes B-mode imaging, spectral Doppler, color Doppler, and power Doppler as needed of all accessible portions of each vessel. Bilateral testing is considered an integral part of a complete examination. Limited examinations for reoccurring indications may be performed as noted. The reflux portion of the exam is performed with the patient in reverse Trendelenburg.  +---------+---------------+---------+-----------+----------+--------------+ RIGHT    CompressibilityPhasicitySpontaneityPropertiesThrombus Aging +---------+---------------+---------+-----------+----------+--------------+ CFV      Full           Yes      Yes                                 +---------+---------------+---------+-----------+----------+--------------+ SFJ      Full                                                        +---------+---------------+---------+-----------+----------+--------------+ FV Prox  Full                                                        +---------+---------------+---------+-----------+----------+--------------+ FV Mid   Full                                                        +---------+---------------+---------+-----------+----------+--------------+ FV DistalFull                                                        +---------+---------------+---------+-----------+----------+--------------+ PFV      Full                                                        +---------+---------------+---------+-----------+----------+--------------+ POP      Full           Yes      Yes                                 +---------+---------------+---------+-----------+----------+--------------+  PTV      Full                                                        +---------+---------------+---------+-----------+----------+--------------+ PERO     Full                                                         +---------+---------------+---------+-----------+----------+--------------+   +---------+---------------+---------+-----------+----------+--------------+ LEFT     CompressibilityPhasicitySpontaneityPropertiesThrombus Aging +---------+---------------+---------+-----------+----------+--------------+ CFV      Full           Yes      Yes                                 +---------+---------------+---------+-----------+----------+--------------+ SFJ      Full                                                        +---------+---------------+---------+-----------+----------+--------------+ FV Prox  Full                                                        +---------+---------------+---------+-----------+----------+--------------+ FV Mid   Full                                                        +---------+---------------+---------+-----------+----------+--------------+ FV DistalFull                                                        +---------+---------------+---------+-----------+----------+--------------+ PFV      Full                                                        +---------+---------------+---------+-----------+----------+--------------+ POP      Full           Yes      Yes                                 +---------+---------------+---------+-----------+----------+--------------+ PTV      Full                                                        +---------+---------------+---------+-----------+----------+--------------+  PERO     Full                                                        +---------+---------------+---------+-----------+----------+--------------+     Summary: BILATERAL: - No evidence of deep vein thrombosis seen in the lower extremities, bilaterally. -No evidence of popliteal cyst, bilaterally.   *See table(s) above for measurements and observations. Electronically signed by Gerarda Fraction on  05/03/2023 at 3:40:10 PM.    Final    Overnight EEG with video  Result Date: 05/03/2023 Charlsie Quest, MD     05/03/2023  7:38 PM Patient Name: Jared [name] MRN: [id number] Epilepsy Attending: Charlsie Quest Referring Physician/Provider: Gevena Mart, NP Duration: 05/02/2023 1937 to 05/03/2023 1550 Patient history: 67 yo M with arm tremors, hx of tremors with LOC, frequency increased in the last 3 months, had episode today without LOC. EEG to evaluate for seizure. [name] of alertness: Awake, asleep AEDs during EEG study: GBP Technical aspects: This EEG study was done with scalp electrodes positioned according to the 10-20 International system of electrode placement. Electrical activity was reviewed with band pass filter of 1-70Hz , sensitivity of 7 uV/mm, display speed of 79mm/sec with a 60Hz  notched filter applied as appropriate. EEG data were recorded continuously and digitally stored.  Video monitoring was available and reviewed as appropriate. Description: The posterior dominant rhythm consists of 8 Hz activity of moderate voltage (25-35 uV) seen predominantly in posterior head regions, symmetric and reactive to eye opening and eye closing. Sleep was characterized by vertex waves, sleep spindles (12 to 14 Hz), maximal frontocentral region.  Event button was pressed over 50 times after 05/03/2023 0416 for tremors of the upper extremity. Concomitant EEG before, during and after the event showed normal posterior dominant rhythm, did not show any EEG changes suggest seizure. Hyperventilation and photic stimulation were not performed.   IMPRESSION: This study is within normal limits. No seizures or epileptiform discharges were seen throughout the recording. Event button was pressed several times during the study for tremors of upper extremity without concomitant EEG change.  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 Charlsie Quest   NM Pulmonary Perf and Vent  Result Date: 05/02/2023 CLINICAL DATA:  Evaluate for  pulmonary embolism. EXAM: NUCLEAR MEDICINE PERFUSION LUNG SCAN TECHNIQUE: Perfusion images were obtained in multiple projections after intravenous injection of radiopharmaceutical. Ventilation scans intentionally deferred if perfusion scan and chest x-ray adequate for interpretation during COVID 19 epidemic. RADIOPHARMACEUTICALS:  4 mCi Tc-22m MAA IV COMPARISON:  Chest radiograph-earlier same day; 04/30/2023 Chest CT-07/12/2022 FINDINGS: Review of chest radiograph performed earlier same day demonstrates unchanged enlarged cardiac silhouette and mediastinal contours. Old left-sided rib fractures. No focal airspace opacities. No pleural effusion or pneumothorax. No evidence of edema Perfusion imaging demonstrates mild heterogeneous perfusion of the bilateral pulmonary parenchyma without discrete area of non perfusion to suggest pulmonary embolism. IMPRESSION: Pulmonary embolism absent. No discrete areas of non perfusion to suggest pulmonary embolism. Electronically Signed   By: Simonne Come M.D.   On: 05/02/2023 17:35   DG CHEST PORT 1 VIEW  Result Date: 05/02/2023 CLINICAL DATA:  Dyspnea EXAM: PORTABLE CHEST 1 VIEW COMPARISON:  None Available. FINDINGS: Normal mediastinum and cardiac silhouette. Normal pulmonary vasculature. No evidence of effusion, infiltrate, or pneumothorax. No acute bony abnormality. IMPRESSION: Normal chest radiograph Electronically Signed  By: Genevive Bi M.D.   On: 05/02/2023 16:32   VAS [place] IVC/ILIAC (VENOUS ONLY)  Result Date: 05/02/2023 IVC/ILIAC STUDY Patient Name:  Jared [name]  Date of Exam:   05/02/2023 Medical Rec #: [id number]     Accession #:    [id number] Date of Birth: 11-15-1955      Patient Gender: M Patient Age:   16 years Exam Location:  [facility] Procedure:      VAS [place] IVC/ILIAC (VENOUS ONLY) Referring Phys: Jonah Blue --------------------------------------------------------------------------------  Indications: A vague density area of concern in the  IVC was noted on echo exam. Limitations: Air/bowel gas and Status post appendectomy laparoscopic 05/01/23.  Performing Technologist: Marilynne Halsted RDMS, RVT  Examination Guidelines: A complete evaluation includes B-mode imaging, spectral Doppler, color Doppler, and power Doppler as needed of all accessible portions of each vessel. Bilateral testing is considered an integral part of a complete examination. Limited examinations for reoccurring indications may be performed as noted.  IVC/Iliac Findings: +----------+------+--------+-----------------+    IVC    PatentThrombus    Comments      +----------+------+--------+-----------------+ IVC Prox                poorly visualized +----------+------+--------+-----------------+ IVC Mid                 poorly visualized +----------+------+--------+-----------------+ IVC Distal              not visualized    +----------+------+--------+-----------------+    Summary: IVC/Iliac: Visualization of proximal Inferior Vena Cava, mid inferior vena cava and distal Inferior Vena Cava was limited. 2D imaging with color evaluation of the IVC was non- diagnostic due to patient's body habitus and air in the abdominal cavity.  *See table(s) above for measurements and observations.  Electronically signed by Carolynn Sayers on 05/02/2023 at 4:10:22 PM.   Final    ECHOCARDIOGRAM COMPLETE  Result Date: 05/02/2023    ECHOCARDIOGRAM REPORT   Patient Name:   Jared [name] Date of Exam: 05/02/2023 Medical Rec #:  [id number]    Height:       75.0 in Accession #:    [id number]   Weight:       219.0 lb Date of Birth:  08-20-1955     BSA:          2.281 m Patient Age:    67 years     BP:           107/69 mmHg Patient Gender: M            HR:           104 bpm. Exam Location:  Inpatient Procedure: 2D Echo, Color Doppler, Cardiac Doppler and Intracardiac            Opacification Agent Indications:    R55 Syncope  History:        Patient has prior history of Echocardiogram  examinations, most                 recent 12/13/2021. CAD, COPD, Arrythmias:Atrial Fibrillation; Risk                 Factors:Hypertension and Diabetes.  Sonographer:    Irving Burton Senior RDCS Referring Phys: [id number] Francena Hanly [facility]  Sonographer Comments: Very poor echo windows due to body habitus and COPD IMPRESSIONS  1. Left ventricular ejection fraction, by estimation, is 55 to 60%. The left ventricle has normal function. The left ventricle has no regional wall motion abnormalities. Left ventricular diastolic  parameters are consistent with Grade II diastolic dysfunction (pseudonormalization).  2. Right ventricular systolic function is moderately reduced. The right ventricular size is moderately enlarged. Tricuspid regurgitation signal is inadequate for assessing PA pressure.  3. Left atrial size was mildly dilated.  4. The mitral valve is degenerative. No evidence of mitral valve regurgitation. No evidence of mitral stenosis.  5. The aortic valve is tricuspid. Aortic valve regurgitation is not visualized. Aortic valve sclerosis/calcification is present, without any evidence of aortic stenosis.  6. There is a vague density in the IVC. Cannot rule out thrombus. Recommend dedicated Abdominal CTA vs. Abdominal US for further evaluation. The inferior vena cava is dilated in size with >50% respiratory variability, suggesting right atrial pressure of  8 mmHg.  7. In setting of syncope as well as RV dysfunction, consider acute PE. Recommend Chest CTA if clinically indicated. FINDINGS  Left Ventricle: Left ventricular ejection fraction, by estimation, is 55 to 60%. The left ventricle has normal function. The left ventricle has no regional wall motion abnormalities. Definity contrast agent was given IV to delineate the left ventricular  endocardial borders. The left ventricular internal cavity size was normal in size. There is no left ventricular hypertrophy. Left ventricular diastolic parameters are consistent with Grade II  diastolic dysfunction (pseudonormalization). Normal left ventricular filling pressure. Right Ventricle: The right ventricular size is moderately enlarged. No increase in right ventricular wall thickness. Right ventricular systolic function is moderately reduced. Tricuspid regurgitation signal is inadequate for assessing PA pressure. Left Atrium: Left atrial size was mildly dilated. Right Atrium: Right atrial size was normal in size. Pericardium: There is no evidence of pericardial effusion. Presence of epicardial fat layer. Mitral Valve: The mitral valve is degenerative in appearance. There is mild calcification of the mitral valve leaflet(s). Mild to moderate mitral annular calcification. No evidence of mitral valve regurgitation. No evidence of mitral valve stenosis. Tricuspid Valve: The tricuspid valve is normal in structure. Tricuspid valve regurgitation is trivial. No evidence of tricuspid stenosis. Aortic Valve: The aortic valve is tricuspid. Aortic valve regurgitation is not visualized. Aortic valve sclerosis/calcification is present, without any evidence of aortic stenosis. Pulmonic Valve: The pulmonic valve was normal in structure. Pulmonic valve regurgitation is not visualized. No evidence of pulmonic stenosis. Aorta: The aortic root is normal in size and structure. Venous: There is a vague density in the IVC. Cannot rule out thrombus. Recommend dedicated Abdominal CTA vs. Abdominal US for further evaluation. The inferior vena cava is dilated in size with greater than 50% respiratory variability, suggesting right atrial pressure of 8 mmHg. IAS/Shunts: No atrial [name] shunt detected by color flow Doppler.  LEFT VENTRICLE PLAX 2D LVIDd:         4.90 cm     Diastology LVIDs:         3.80 cm     LV e' medial:    6.20 cm/s LV PW:         1.10 cm     LV E/e' medial:  10.9 LV IVS:        1.00 cm     LV e' lateral:   6.31 cm/s LVOT diam:     2.10 cm     LV E/e' lateral: 10.7 LV SV:         47 LV SV Index:   21  LVOT Area:     3.46 cm  LV Volumes (MOD) LV vol d, MOD A4C: 98.9 ml LV vol s, MOD A4C: 57.8 ml LV SV MOD  A4C:     98.9 ml RIGHT VENTRICLE RV S prime:     9.36 cm/s TAPSE (M-mode): 1.9 cm LEFT ATRIUM             Index        RIGHT ATRIUM           Index LA diam:        3.30 cm 1.45 cm/m   RA Area:     23.70 cm LA Vol (A2C):   80.6 ml 35.34 ml/m  RA Volume:   71.30 ml  31.26 ml/m LA Vol (A4C):   74.4 ml 32.62 ml/m LA Biplane Vol: 81.7 ml 35.82 ml/m  AORTIC VALVE LVOT Vmax:   88.10 cm/s LVOT Vmean:  65.100 cm/s LVOT VTI:    0.135 m  AORTA Ao Root diam: 3.40 cm MITRAL VALVE MV Area (PHT): 3.39 cm    SHUNTS MV Decel Time: 224 msec    Systemic VTI:  0.14 m MV E velocity: 67.30 cm/s  Systemic Diam: 2.10 cm MV A velocity: 60.80 cm/s MV E/A ratio:  1.11 Armanda Magic MD Electronically signed by Armanda Magic MD Signature Date/Time: 05/02/2023/12:22:10 PM    Final       Jared [name] 05/04/2023  Patient ID: Jared [name], male   DOB: 27-Jul-1955, 67 y.o.   MRN: [id number]

## 2023-05-04 NOTE — Progress Notes (Signed)
PHARMACY - ANTICOAGULATION CONSULT NOTE  Pharmacy Consult for heparin  Indication: pulmonary embolus, suspected  Allergies  Allergen Reactions   Lisinopril Cough    Patient Measurements: Height: 6\' 3"  (190.5 cm) Weight: 99.3 kg (219 lb) IBW/kg (Calculated) : 84.5 Heparin Dosing Weight: 99.3 kg   Vital Signs: Temp: 98.1 F (36.7 C) (11/23 1541) Temp Source: Oral (11/23 1541) BP: 111/72 (11/23 1541) Pulse Rate: 81 (11/23 1541)  Labs: Recent Labs    05/02/23 0755 05/02/23 1717 05/02/23 1718 05/02/23 1906 05/03/23 0657 05/04/23 0550 05/04/23 1533  HGB 9.6*  --   --   --  8.8* 8.2*  --   HCT 33.2*  --   --   --  30.5* 27.9*  --   PLT 355  --   --   --  359 375  --   HEPARINUNFRC  --   --   --    < > 0.62 0.33 0.21*  CREATININE 1.61*  --   --   --  1.93* 1.44*  --   TROPONINIHS  --  37* 40*  --   --   --   --    < > = values in this interval not displayed.    Estimated Creatinine Clearance: 57.9 mL/min (A) (by C-G formula based on SCr of 1.44 mg/dL (H)).   Medical History: Past Medical History:  Diagnosis Date   AVM (arteriovenous malformation)    CAD (coronary artery disease)    a. LHC 5/12:  LAD 20, pLCx 20, pRCA 40, dRCA 40, EF 35%, diff HK  //  b. Myoview 4/16: Overall Impression:  High risk stress nuclear study There is no evidence of ischemia.  There is severe LV dysfunction. LV Ejection Fraction: 30%.  LV Wall Motion:  There is global LV hypokinesis.     CAP (community acquired pneumonia) 09/2013   Chronic combined systolic and diastolic CHF (congestive heart failure) (HCC)    a. Echo 4/16:Mild LVH, EF 40-45%, diffuse HK //  b. Echo 8/17: EF 35-40%, diffuse HK, diastolic dysfunction, aortic sclerosis, trivial MR, moderate LAE, normal RVSF, moderate RAE, mild TR, PASP 42 mmHg // c. Echo 4/18: Mild concentric LVH, EF 30-35, normal wall motion, grade 1 diastolic dysfunction, PASP 49   Chronic respiratory failure (HCC)    Cluster headache    "hx; haven't had one  in awhile" (01/09/2016)   COPD (chronic obstructive pulmonary disease) (HCC)    [name] 01/09/2016   DM (diabetes mellitus) (HCC)    History of CVA (cerebrovascular accident)    Hypertension    IDA (iron deficiency anemia)    Moderate tobacco use disorder    NICM (nonischemic cardiomyopathy) (HCC)    Nicotine addiction    Tobacco abuse    Type 2 diabetes mellitus (HCC) 05/14/2016    Assessment: 67 yo M with suspected PE from ECHO findings. V/Q scan neg for PE. Vague density in the IVC. Cannot rule out thrombus. Pt is s/p appendectomy with drain placement on 11/20.   No signs of bleeding or issues with heparin gtt. Heparin level subtherapeutic 0.21 at 1700 units/hr.    Goal of Therapy:  Heparin level 0.3-0.7 units/ml Monitor platelets by anticoagulation protocol: Yes   Plan:  Increase heparin infusion to 1900 units/hr Check heparin level in 6 hours and daily while on heparin Continue to monitor H&H and platelets   Thank you for allowing pharmacy to be a part of this patient's care.  Thelma Barge, PharmD Clinical Pharmacist

## 2023-05-04 NOTE — Progress Notes (Signed)
Progress Note   Patient: Jared [name] WUJ:[id number] DOB: 1955/12/15 DOA: 04/30/2023     4 DOS: the patient was seen and examined on 05/04/2023   Brief hospital course: 67yo with h/o chronic combined CHF, HTN, CVA, afib on Xarelto, PVD, and COPD on 2L home O2 who presented on 11/19 with SOB and other complaints.  Fever 100.9, WBC 13.4, lactate 2.9, Creatinine 1.65 with AKI.  CT A?P with acute perforated appendicitis with microperforation.  Admitted for IV antibiotics, Xarelto washout.  Surgery is consulting.  Assessment and Plan:  Sepsis due to acute perforated appendicitis with microperforation Surgery consulted and recommended holding Xarelto and keeping n.p.o. except for sips with meds Underwent lap appy on 11/20 - visible appendiceal perforation noted with abscess that was drained and fecaliths removed; surgical drain was left in place Continue IV Zosyn Blood cultures pending x 4 days Surgery assumed care post-operatively but given complications post-operatively he will return to the [facility] service Abdomen remains sore and distended with drain in LLQ There is some concern today for ileus vs. Abscess so surgery is continuing to closely follow Worsening leukocytosis with high risk of complications, will rescan   AKI (acute kidney injury)  Prerenal AKI on presentation with elevated creatinine of 1.69 from prior of 1 Resolved with IVF Recurred post-operatively on 11/21 and so IVF resumed Worsening AKI 11/22, improved today Continue IVF Will consult nephrology   Syncope and collapse Patient reports 2 years of intermittent generalized body shaking with syncope and loss of consciousness that are often positional when going from sitting to standing. Last echocardiogram was 12/2021 with EF of 45 to 50% and global hypokinesis, grade 1 diastolic dysfunction.  No significant valvular abnormality. Repeat echo shows preserved EF, grade 2 diastolic dysfunction with moderate reduction in RV function  and RV dilatation and a vague density in the IVC, concerning for possible PE/IVC thrombus STAT VQ scan negative for PE  Abdominal US ordered to rule out IVC thrombus but was non-diagnostic due to body habitus and air in abdominal cavity DVT US negative 21 beat run of NSVT this AM (11/23) Cardiology consulted Will continue to treat empirically with Heparin for now   Tremors LTM EEG with non-epileptic spells Neurology consulted Recommend NOT increasing Oxy or gabapentin   IDA Patient with h/o iron deficiency anemia Receives weekly IV iron infusions, due today Hgb 8.8 on presentation, down from 10/6 on 11/1 Given IV iron dose on 11/21 as per patient request Hgb improved to 9.6 on 11/22 post-iron   Chronic respiratory failure with hypoxia  Secondary to COPD on 2L Stable although with wheezing on exam at time of admission (none this AM) PRN duoneb, Albuterol Continue Breztri Nurse reported wheezing this AM but he appeared more comfortable at the time of my evaluation   Essential hypertension Continue hydralazine, Imdur, metoprolol Holding Lasix due to recurrent AKI   HLD Continue Zetia, atorvastatin   Atrial flutter  Continue Toprol XL Holding Xarelto due to surgery Starting Heparin infusion on 11/21 Consider transition back to Xarelto if abdominal CT is unremarkable   Chronic diastolic CHF Echo 11/21 with preserved EF and grade 2 diastolic dysfunction Concern for IVC thrombus   History of CVA (cerebrovascular accident) Continue aspirin Xarelto currently on hold    Peripheral vascular disease  Continue aspirin, statin   Depression Continue Cymbalta, gabapentin   Tobacco use Ongoing use of close to 1 pack/day Nicotine patch offered   Malnutrition Nutrition Problem: Moderate Malnutrition Etiology: acute illness Signs/Symptoms: energy intake <  75% for > 7 days, mild muscle depletion, mild fat depletion Interventions: MVI          Consultants: Surgery Cardiology Neurology PCCM Nutrition   Procedures: Appendectomy 11/20   Antibiotics: Zosyn 11/19-    30 Day Unplanned Readmission Risk Score    Flowsheet Row ED to Hosp-Admission (Current) from 04/30/2023 in [facility]  SURGICAL  30 Day Unplanned Readmission Risk Score (%) 19.03 Filed at 05/04/2023 0800       This score is the patient's risk of an unplanned readmission within 30 days of being discharged (0 -100%). The score is based on dignosis, age, lab data, medications, orders, and past utilization.   Low:  0-14.9   Medium: 15-21.9   High: 22-29.9   Extreme: 30 and above           Subjective: No specific complaints today.  Still with abdominal distention and TTP.   Objective: Vitals:   05/04/23 0801 05/04/23 0802  BP:    Pulse:    Resp:    Temp:    SpO2: 95% 96%    Intake/Output Summary (Last 24 hours) at 05/04/2023 1300 Last data filed at 05/04/2023 1150 Gross per 24 hour  Intake 831.09 ml  Output 840 ml  Net -8.91 ml   Filed Weights   04/30/23 2314 05/01/23 1334  Weight: 99.4 kg 99.3 kg    Exam:  General:  Appears calm and comfortable and is in NAD, on 2L [place] O2 Eyes:   EOMI, normal lids, iris ENT:  grossly normal hearing, lips & tongue, mmm Neck:  no LAD, masses or thyromegaly Cardiovascular:  RRR, no m/r/g. No LE edema.  Respiratory:   CTA bilaterally with no wheezes/rales/rhonchi.  Normal respiratory effort. Abdomen:  distended, sanguinous fluid in drain is scant, abdomen is diffusely TTP Skin:  no rash or induration seen on limited exam Musculoskeletal:  grossly normal tone BUE/BLE, good ROM, no bony abnormality Psychiatric:  grossly normal mood and affect, speech fluent and appropriate, AOx3 Neurologic:  CN 2-12 grossly intact, moves all extremities in coordinated fashion  Data Reviewed: I have reviewed the patient's lab results since admission.  Pertinent labs for today include:   Na++  131 Glucose 150 BUN 19/Creatinine 1.44/GFR 53, improved from 23/1.93/37 on 11/22 Albumin 2.2 WBC 14.5 Hgb 8.2, 8.8 on 11/22     Family Communication: None present  Disposition: Status is: Inpatient Remains inpatient appropriate because: ongoing management     Time spent: 35 minutes  Unresulted Labs (From admission, onward)     Start     Ordered   05/04/23 1600  Heparin level (unfractionated)  Once,   R        05/04/23 0735   05/03/23 0500  Heparin level (unfractionated)  Daily,   R      05/02/23 1318   05/03/23 0500  CBC  Daily,   R      05/02/23 1318             Author: Jonah Blue, MD 05/04/2023 1:00 PM  For on call review [email or web address].

## 2023-05-05 ENCOUNTER — Inpatient Hospital Stay (HOSPITAL_COMMUNITY): Payer: Medicare HMO

## 2023-05-05 ENCOUNTER — Encounter (HOSPITAL_COMMUNITY): Payer: Self-pay | Admitting: Family Medicine

## 2023-05-05 DIAGNOSIS — J9621 Acute and chronic respiratory failure with hypoxia: Secondary | ICD-10-CM

## 2023-05-05 DIAGNOSIS — K3532 Acute appendicitis with perforation and localized peritonitis, without abscess: Secondary | ICD-10-CM | POA: Diagnosis not present

## 2023-05-05 LAB — CULTURE, BLOOD (ROUTINE X 2)
Culture: NO GROWTH
Culture: NO GROWTH
Special Requests: ADEQUATE
Special Requests: ADEQUATE

## 2023-05-05 LAB — GLUCOSE, CAPILLARY
Glucose-Capillary: 111 mg/dL — ABNORMAL HIGH (ref 70–99)
Glucose-Capillary: 117 mg/dL — ABNORMAL HIGH (ref 70–99)
Glucose-Capillary: 114 mg/dL — ABNORMAL HIGH (ref 70–99)
Glucose-Capillary: 110 mg/dL — ABNORMAL HIGH (ref 70–99)

## 2023-05-05 LAB — HEPARIN LEVEL (UNFRACTIONATED)
Heparin Unfractionated: 0.43 [IU]/mL (ref 0.30–0.70)
Heparin Unfractionated: 0.49 [IU]/mL (ref 0.30–0.70)
Heparin Unfractionated: 0.52 [IU]/mL (ref 0.30–0.70)

## 2023-05-05 LAB — BLOOD GAS, ARTERIAL
Acid-Base Excess: 6.5 mmol/L — ABNORMAL HIGH (ref 0.0–2.0)
Bicarbonate: 36.3 mmol/L — ABNORMAL HIGH (ref 20.0–28.0)
O2 Saturation: 100 %
Patient temperature: 37
pCO2 arterial: 79 mm[Hg] (ref 32–48)
pH, Arterial: 7.27 — ABNORMAL LOW (ref 7.35–7.45)
pO2, Arterial: 370 mm[Hg] — ABNORMAL HIGH (ref 83–108)

## 2023-05-05 LAB — CBC
HCT: 28.5 % — ABNORMAL LOW (ref 39.0–52.0)
Hemoglobin: 8.3 g/dL — ABNORMAL LOW (ref 13.0–17.0)
MCH: 26.1 pg (ref 26.0–34.0)
MCHC: 29.1 g/dL — ABNORMAL LOW (ref 30.0–36.0)
MCV: 89.6 fL (ref 80.0–100.0)
Platelets: 440 10*3/uL — ABNORMAL HIGH (ref 150–400)
RBC: 3.18 MIL/uL — ABNORMAL LOW (ref 4.22–5.81)
RDW: 16.7 % — ABNORMAL HIGH (ref 11.5–15.5)
WBC: 15.9 10*3/uL — ABNORMAL HIGH (ref 4.0–10.5)
nRBC: 0.3 % — ABNORMAL HIGH (ref 0.0–0.2)

## 2023-05-05 LAB — PROTIME-INR
INR: 1 (ref 0.8–1.2)
Prothrombin Time: 13.7 s (ref 11.4–15.2)

## 2023-05-05 LAB — BASIC METABOLIC PANEL
Anion gap: 14 (ref 5–15)
BUN: 12 mg/dL (ref 8–23)
CO2: 27 mmol/L (ref 22–32)
Calcium: 8.6 mg/dL — ABNORMAL LOW (ref 8.9–10.3)
Chloride: 93 mmol/L — ABNORMAL LOW (ref 98–111)
Creatinine, Ser: 1.07 mg/dL (ref 0.61–1.24)
GFR, Estimated: >60 mL/min (ref >60)
Glucose, Bld: 113 mg/dL — ABNORMAL HIGH (ref 70–99)
Potassium: 3.8 mmol/L (ref 3.5–5.1)
Sodium: 134 mmol/L — ABNORMAL LOW (ref 135–145)

## 2023-05-05 LAB — MRSA NEXT GEN BY PCR, NASAL: MRSA by PCR Next Gen: NOT DETECTED

## 2023-05-05 MED ORDER — FENTANYL CITRATE (PF) 100 MCG/2ML IJ SOLN
INTRAMUSCULAR | Status: AC | PRN
  Administered 2023-05-05: 25 ug via INTRAVENOUS
  Administered 2023-05-05: 50 ug via INTRAVENOUS

## 2023-05-05 MED ORDER — LIDOCAINE HCL 1 % IJ SOLN
INTRAMUSCULAR | Status: AC
  Filled 2023-05-05: qty 10

## 2023-05-05 MED ORDER — HEPARIN (PORCINE) 25000 UT/250ML-% IV SOLN
1900.0000 [IU]/h | INTRAVENOUS | Status: AC
  Administered 2023-05-05 – 2023-05-10 (×9): 1900 [IU]/h via INTRAVENOUS
  Filled 2023-05-05 (×9): qty 250

## 2023-05-05 MED ORDER — FENTANYL CITRATE (PF) 100 MCG/2ML IJ SOLN
INTRAMUSCULAR | Status: AC
  Filled 2023-05-05: qty 2

## 2023-05-05 MED ORDER — SIMETHICONE 80 MG PO CHEW
80.0000 mg | CHEWABLE_TABLET | Freq: Once | ORAL | Status: AC
  Administered 2023-05-05: 80 mg via ORAL
  Filled 2023-05-05: qty 1

## 2023-05-05 MED ORDER — MIDAZOLAM HCL 2 MG/2ML IJ SOLN
INTRAMUSCULAR | Status: AC
  Filled 2023-05-05: qty 2

## 2023-05-05 MED ORDER — MIDAZOLAM HCL 2 MG/2ML IJ SOLN
INTRAMUSCULAR | Status: AC | PRN
  Administered 2023-05-05: 1 mg via INTRAVENOUS
  Administered 2023-05-05: .5 mg via INTRAVENOUS

## 2023-05-05 MED ORDER — CHLORHEXIDINE GLUCONATE CLOTH 2 % EX PADS
6.0000 | MEDICATED_PAD | Freq: Every day | CUTANEOUS | Status: DC
  Administered 2023-05-05 – 2023-05-20 (×16): 6 via TOPICAL

## 2023-05-05 MED ORDER — LIDOCAINE-EPINEPHRINE 1 %-1:100000 IJ SOLN
INTRAMUSCULAR | Status: AC
  Filled 2023-05-05: qty 1

## 2023-05-05 NOTE — Consult Note (Addendum)
Chief Complaint: Patient was seen in consultation today for possible intervention for the pelvic fluid collection Chief Complaint  Patient presents with   Shortness of Breath   Headache   Constipation   Testicle Pain   Nasal Congestion   at the request of [name]   Referring Physician(s): [name]   Supervising Physician: Marliss Coots  Patient Status: [facility] - In-pt  History of Present Illness: Jared [name] is a 67 y.o. male with PMHs of CAD, CHF, COPD, IDA, HTN, DM, a fib, PVD, and recent appendectomy for perforated appendicitis who presents with worsening leukocytosis and pelvic fluid collection.   Patient presented to ED on 04/30/23 with RLQ and testicle pain, workup showed sepsis and perforated appendicitis. Patient underwent laparoscopic appendectomy on 05/01/23, repeat CT A/P was obtained on 05/04/23 due to worsening leukocytosis which showed:   1. Interval appendectomy. Crescentic fluid collection within the deep pelvis extending to this area of inflammatory change within the surgical bed containing several punctate locules of gas suspicious for a developing deep pelvic abscess measuring at least 3.8 x 3.9 x 6.5 cm in dimension. This does not communicate with the indwelling surgical drainage catheter. 2. Infiltration of peritoneal fat and peritoneal enhancement in keeping changes of peritonitis. 3. Postoperative adynamic ileus with multiple loops of dilated fluid-filled proximal and mid small bowel. 4. Small right pleural effusion. 5. Moderate right coronary artery calcification.  IR was consulted for possible intervention for the pelvic fluid collection, case reviewed and approved for CT guided aspiration and possible drain placement by Dr. Elby Showers. Plan to proceed around 1 pm today.   Patient laying in bed, not in acute distress.  Denise headache, fever, chills, shortness of breath, cough, chest pain, abdominal pain, nausea ,vomiting, and  bleeding.  Patient does not engage in conversation much but states that he is ok with the drain placement. Patient is oriented to self and place, but unable to state the date. Thinks it is December. Understands the situation, states that he was admitted due to his belly was inflamed and making him sick.   Past Medical History:  Diagnosis Date   AVM (arteriovenous malformation)    CAD (coronary artery disease)    a. LHC 5/12:  LAD 20, pLCx 20, pRCA 40, dRCA 40, EF 35%, diff HK  //  b. Myoview 4/16: Overall Impression:  High risk stress nuclear study There is no evidence of ischemia.  There is severe LV dysfunction. LV Ejection Fraction: 30%.  LV Wall Motion:  There is global LV hypokinesis.     CAP (community acquired pneumonia) 09/2013   Chronic combined systolic and diastolic CHF (congestive heart failure) (HCC)    a. Echo 4/16:Mild LVH, EF 40-45%, diffuse HK //  b. Echo 8/17: EF 35-40%, diffuse HK, diastolic dysfunction, aortic sclerosis, trivial MR, moderate LAE, normal RVSF, moderate RAE, mild TR, PASP 42 mmHg // c. Echo 4/18: Mild concentric LVH, EF 30-35, normal wall motion, grade 1 diastolic dysfunction, PASP 49   Chronic respiratory failure (HCC)    Cluster headache    "hx; haven't had one in awhile" (01/09/2016)   COPD (chronic obstructive pulmonary disease) (HCC)    [name] 01/09/2016   DM (diabetes mellitus) (HCC)    History of CVA (cerebrovascular accident)    Hypertension    IDA (iron deficiency anemia)    Moderate tobacco use disorder    NICM (nonischemic cardiomyopathy) (HCC)    Nicotine addiction    Tobacco abuse    Type 2 diabetes  mellitus (HCC) 05/14/2016    Past Surgical History:  Procedure Laterality Date   ABDOMINAL AORTOGRAM W/LOWER EXTREMITY N/A 12/07/2022   Procedure: ABDOMINAL AORTOGRAM W/LOWER EXTREMITY;  Surgeon: Leonie Douglas, MD;  Location: [facility];  Service: Cardiovascular;  Laterality: N/A;   BIOPSY  11/12/2020   Procedure: BIOPSY;  Surgeon:  Lemar Lofty., MD;  Location: [facility] ENDOSCOPY;  Service: Gastroenterology;;   CARDIAC CATHETERIZATION  10/2010   LM normal, LAD with 20% irregularities, LCX with 20%, RCA with 40% prox and 40% distal - EF of 35%   CATARACT EXTRACTION, BILATERAL     COLONOSCOPY W/ BIOPSIES AND POLYPECTOMY     COLONOSCOPY WITH PROPOFOL N/A 09/06/2018   Procedure: COLONOSCOPY WITH PROPOFOL;  Surgeon: Tressia Danas, MD;  Location: [facility] ENDOSCOPY;  Service: Gastroenterology;  Laterality: N/A;   ENTEROSCOPY N/A 09/28/2018   Procedure: ENTEROSCOPY;  Surgeon: Jeani Hawking, MD;  Location: [facility] ENDOSCOPY;  Service: Endoscopy;  Laterality: N/A;   ENTEROSCOPY N/A 10/28/2018   Procedure: ENTEROSCOPY;  Surgeon: Tressia Danas, MD;  Location: [facility] ENDOSCOPY;  Service: Gastroenterology;  Laterality: N/A;   ENTEROSCOPY N/A 10/09/2020   Procedure: ENTEROSCOPY;  Surgeon: Hilarie Fredrickson, MD;  Location: [facility] ENDOSCOPY;  Service: Endoscopy;  Laterality: N/A;   ENTEROSCOPY N/A 03/22/2022   Procedure: ENTEROSCOPY;  Surgeon: Benancio Deeds, MD;  Location: [facility] ENDOSCOPY;  Service: Gastroenterology;  Laterality: N/A;   ESOPHAGOGASTRODUODENOSCOPY N/A 11/12/2020   Procedure: ESOPHAGOGASTRODUODENOSCOPY (EGD);  Surgeon: Lemar Lofty., MD;  Location: [facility] ENDOSCOPY;  Service: Gastroenterology;  Laterality: N/A;   ESOPHAGOGASTRODUODENOSCOPY (EGD) WITH PROPOFOL N/A 09/05/2018   Procedure: ESOPHAGOGASTRODUODENOSCOPY (EGD) WITH PROPOFOL;  Surgeon: Benancio Deeds, MD;  Location: [facility] ENDOSCOPY;  Service: Gastroenterology;  Laterality: N/A;   ESOPHAGOGASTRODUODENOSCOPY (EGD) WITH PROPOFOL N/A 11/19/2020   Procedure: ESOPHAGOGASTRODUODENOSCOPY (EGD) WITH PROPOFOL;  Surgeon: Beverley Fiedler, MD;  Location: [facility] ENDOSCOPY;  Service: Gastroenterology;  Laterality: N/A;   ESOPHAGOGASTRODUODENOSCOPY (EGD) WITH PROPOFOL N/A 12/27/2022   Procedure: ESOPHAGOGASTRODUODENOSCOPY (EGD) WITH PROPOFOL;  Surgeon: Napoleon Form, MD;  Location: [facility]  ENDOSCOPY;  Service: Gastroenterology;  Laterality: N/A;   EXCISION MASS HEAD     GIVENS CAPSULE STUDY N/A 09/06/2018   Procedure: GIVENS CAPSULE STUDY;  Surgeon: Tressia Danas, MD;  Location: [facility] ENDOSCOPY;  Service: Gastroenterology;  Laterality: N/A;   GIVENS CAPSULE STUDY N/A 09/26/2018   Procedure: GIVENS CAPSULE STUDY;  Surgeon: Beverley Fiedler, MD;  Location: [facility] ENDOSCOPY;  Service: Gastroenterology;  Laterality: N/A;   GIVENS CAPSULE STUDY N/A 06/14/2019   Procedure: GIVENS CAPSULE STUDY;  Surgeon: Napoleon Form, MD;  Location: [facility] ENDOSCOPY;  Service: Endoscopy;  Laterality: N/A;   HEMOSTASIS CLIP PLACEMENT  11/12/2020   Procedure: HEMOSTASIS CLIP PLACEMENT;  Surgeon: Lemar Lofty., MD;  Location: [facility] ENDOSCOPY;  Service: Gastroenterology;;   HEMOSTASIS CONTROL  11/12/2020   Procedure: HEMOSTASIS CONTROL;  Surgeon: Lemar Lofty., MD;  Location: [facility] ENDOSCOPY;  Service: Gastroenterology;;   HOT HEMOSTASIS N/A 10/28/2018   Procedure: HOT HEMOSTASIS (ARGON PLASMA COAGULATION/BICAP);  Surgeon: Tressia Danas, MD;  Location: [facility] ENDOSCOPY;  Service: Gastroenterology;  Laterality: N/A;   HOT HEMOSTASIS N/A 11/12/2020   Procedure: HOT HEMOSTASIS (ARGON PLASMA COAGULATION/BICAP);  Surgeon: Lemar Lofty., MD;  Location: [facility] ENDOSCOPY;  Service: Gastroenterology;  Laterality: N/A;   HOT HEMOSTASIS N/A 03/22/2022   Procedure: HOT HEMOSTASIS (ARGON PLASMA COAGULATION/BICAP);  Surgeon: Benancio Deeds, MD;  Location: [facility] ENDOSCOPY;  Service: Gastroenterology;  Laterality: N/A;   INCISION AND DRAINAGE PERIRECTAL ABSCESS N/A 06/05/2017  Procedure: IRRIGATION AND DEBRIDEMENT PERIRECTAL ABSCESS;  Surgeon: Andria Meuse, MD;  Location: [facility] OR;  Service: General;  Laterality: N/A;   LAPAROSCOPIC APPENDECTOMY N/A 05/01/2023   Procedure: APPENDECTOMY LAPAROSCOPIC;  Surgeon: Emelia Loron, MD;  Location: [facility] OR;  Service: General;  Laterality: N/A;   PERIPHERAL  VASCULAR INTERVENTION  12/07/2022   Procedure: PERIPHERAL VASCULAR INTERVENTION;  Surgeon: Leonie Douglas, MD;  Location: [facility];  Service: Cardiovascular;;   SUBMUCOSAL TATTOO INJECTION  11/12/2020   Procedure: SUBMUCOSAL TATTOO INJECTION;  Surgeon: Lemar Lofty., MD;  Location: [facility] ENDOSCOPY;  Service: Gastroenterology;;   VIDEO BRONCHOSCOPY Bilateral 05/08/2016   Procedure: VIDEO BRONCHOSCOPY WITH FLUORO;  Surgeon: Oretha Milch, MD;  Location: [facility] ENDOSCOPY;  Service: Cardiopulmonary;  Laterality: Bilateral;    Allergies: Lisinopril  Medications: Prior to Admission medications   Medication Sig Start Date End Date Taking? Authorizing Provider  acetaminophen (TYLENOL) 500 MG tablet Take 2 tablets (1,000 mg total) by mouth every 8 (eight) hours as needed for moderate pain. 01/03/23  Yes Newlin, Enobong, MD  albuterol (VENTOLIN HFA) 108 (90 Base) MCG/ACT inhaler INHALE 2 PUFFS INTO THE LUNGS EVERY 6 (SIX) HOURS AS NEEDED FOR WHEEZING OR SHORTNESS OF BREATH. Patient taking differently: Inhale 2 puffs into the lungs daily as needed for wheezing or shortness of breath. 12/06/22  Yes [name], MD  aspirin EC 81 MG tablet Take 1 tablet (81 mg total) by mouth daily. Swallow whole. 12/07/22  Yes Leonie Douglas, MD  atorvastatin (LIPITOR) 80 MG tablet Take 1 tablet (80 mg total) by mouth daily. 02/04/23  Yes [name], MD  DULoxetine (CYMBALTA) 60 MG capsule Take 1 capsule (60 mg total) by mouth daily. For chronic leg pains 01/09/23  Yes [name], MD  ergocalciferol (DRISDOL) 1.25 MG (50000 UT) capsule Take 1 capsule (50,000 Units total) by mouth once a week. 02/26/23  Yes [name], MD  ezetimibe (ZETIA) 10 MG tablet Take 1 tablet (10 mg total) by mouth daily. 01/31/23  Yes Pricilla Riffle, MD  ferrous sulfate (FEROSUL) 325 (65 FE) MG tablet Take 1 tablet (325 mg total) by mouth daily with breakfast. 01/30/23  Yes Danis, Andreas Blower, MD  folic acid (FOLVITE) 1 MG  tablet Take 1 tablet (1 mg total) by mouth daily. 01/30/23  Yes [name], MD  furosemide (LASIX) 40 MG tablet Take 1-2 tablets (40-80 mg total) by mouth 2 (two) times daily as needed.Take an extra pill with worsening pedal edema. 01/09/23  Yes [name], MD  gabapentin (NEURONTIN) 300 MG capsule Take 1 capsule (300 mg total) by mouth 2 (two) times daily. 02/25/23  Yes Kathryne Hitch, MD  hydrALAZINE (APRESOLINE) 50 MG tablet Take 1 tablet (50 mg total) by mouth every 8 (eight) hours. 02/04/23  Yes [name], MD  isosorbide mononitrate (IMDUR) 30 MG 24 hr tablet Take 1 tablet (30 mg total) by mouth daily. 01/30/23  Yes [name], MD  metoprolol succinate (TOPROL-XL) 50 MG 24 hr tablet Take 1 tablet (50 mg total) by mouth daily. Take with or immediately following a meal. 02/04/23  Yes Newlin, Enobong, MD  nitroGLYCERIN (NITROSTAT) 0.4 MG SL tablet Place 1 tablet (0.4 mg total) under the tongue every 5 (five) minutes as needed for chest pain. 11/19/20 04/30/23 Yes Albertine Grates, MD  OXYGEN Inhale 2 L/min into the lungs continuous.   Yes [provider]  pantoprazole (PROTONIX) 40 MG tablet Take 1 tablet (40 mg total) by mouth daily.  02/04/23  Yes [name], MD  rivaroxaban (XARELTO) 20 MG TABS tablet Take 1 tablet (20 mg total) by mouth daily with supper. 10/25/22  Yes [name], MD  Accu-Chek Softclix Lancets lancets Use to check blood sugar once daily. 01/09/23   [name], MD  Blood Glucose Monitoring Suppl (ACCU-CHEK GUIDE) w/Device KIT Use to check blood sugar once daily. 01/30/23   [name], MD  Budeson-Glycopyrrol-Formoterol (BREZTRI AEROSPHERE) 160-9-4.8 MCG/ACT AERO Take 2 puffs first thing in morning and then another 2 puffs about 12 hours later. 02/25/23   Nyoka Cowden, MD  glucose blood (ACCU-CHEK GUIDE) test strip Use to check blood sugar once daily. 01/09/23   [name], MD  TUDORZA PRESSAIR 400 MCG/ACT AEPB INHALE 1 PUFF INTO THE  LUNGS IN THE MORNING AND AT BEDTIME. 03/29/20 04/04/20  Nyoka Cowden, MD     Family History  Problem Relation Age of Onset   Heart disease Mother    Diabetes Mother    Colon cancer Mother    Liver cancer Mother    Cancer Father        type unknown   Diabetes Sister        x 2   Diabetes Brother     Social History   Socioeconomic History   Marital status: Single    Spouse name: Not on file   Number of children: 1   Years of education: Not on file   Highest education level: 12th grade  Occupational History   Occupation: retired  Tobacco Use   Smoking status: Every Day    Current packs/day: 0.50    Average packs/day: 0.5 packs/day for 47.0 years (23.5 ttl pk-yrs)    Types: Cigarettes   Smokeless tobacco: Never   Tobacco comments:    4/5 cigs  Vaping Use   Vaping status: Never Used  Substance and Sexual Activity   Alcohol use: Not Currently    Alcohol/week: 0.0 standard drinks of alcohol    Comment: last drink was before xmas   Drug use: No    Types: Cocaine, Marijuana    Comment: "nothing in 20 years"   Sexual activity: Not Currently  Other Topics Concern   Not on file  Social History Narrative   unemployed   Social Determinants of Health   Financial Resource Strain: Medium Risk (02/06/2023)   Overall Financial Resource Strain (CARDIA)    Difficulty of Paying Living Expenses: Somewhat hard  Food Insecurity: No Food Insecurity (04/30/2023)   Hunger Vital Sign    Worried About Running Out of Food in the Last Year: Never true    Ran Out of Food in the Last Year: Never true  Transportation Needs: No Transportation Needs (04/30/2023)   PRAPARE - Administrator, Civil Service (Medical): No    Lack of Transportation (Non-Medical): No  Physical Activity: Insufficiently Active (02/06/2023)   Exercise Vital Sign    Days of Exercise per Week: 3 days    Minutes of Exercise per Session: 10 min  Stress: No Stress Concern Present (02/06/2023)   [employer] of Occupational Health - Occupational Stress Questionnaire    Feeling of Stress : Not at all  Social Connections: Moderately Integrated (02/06/2023)   Social Connection and Isolation Panel [NHANES]    Frequency of Communication with Friends and Family: More than three times a week    Frequency of Social Gatherings with Friends and Family: Twice a week    Attends Religious Services: More than 4  times per year    Active Member of Clubs or Organizations: Yes    Attends Banker Meetings: More than 4 times per year    Marital Status: Divorced     Review of Systems: A 12 point ROS discussed and pertinent positives are indicated in the HPI above.  All other systems are negative.  Vital Signs: BP 130/83 (BP Location: Left Arm)   Pulse (!) 104   Temp 98 F (36.7 C) (Oral)   Resp 20   Ht 6\' 3"  (1.905 m)   Wt 219 lb (99.3 kg)   SpO2 100%   BMI 27.37 kg/m    Physical Exam Vitals reviewed.  Constitutional:      General: He is not in acute distress.    Appearance: He is not ill-appearing.  HENT:     Head: Normocephalic and atraumatic.  Cardiovascular:     Rate and Rhythm: Normal rate and regular rhythm.  Pulmonary:     Breath sounds: Normal breath sounds.     Comments: Slightly labored  Abdominal:     General: Bowel sounds are normal.     Palpations: Abdomen is soft.  Skin:    General: Skin is warm and dry.     Coloration: Skin is not cyanotic or pale.  Neurological:     Mental Status: He is alert.     Comments: Oriented to self and place only   Psychiatric:        Mood and Affect: Mood normal.        Behavior: Behavior normal.     MD Evaluation Airway: WNL Heart: WNL Abdomen: WNL Chest/ Lungs: WNL ASA  Classification: 3 Mallampati/Airway Score: Two  Imaging: CT ABDOMEN PELVIS WO CONTRAST  Result Date: 05/04/2023 CLINICAL DATA:  Peritonitis, bowel perforation, right lower quadrant abdominal pain EXAM: CT ABDOMEN AND PELVIS WITHOUT CONTRAST  TECHNIQUE: Multidetector CT imaging of the abdomen and pelvis was performed following the standard protocol without IV contrast. RADIATION DOSE REDUCTION: This exam was performed according to the departmental dose-optimization program which includes automated exposure control, adjustment of the mA and/or kV according to patient size and/or use of iterative reconstruction technique. COMPARISON:  04/30/2023 FINDINGS: Lower chest: Advanced emphysema noted within the visualized lung bases. Small right pleural effusion has developed. Hypoattenuation of the cardiac blood pool is in keeping with at least mild anemia. Moderate right coronary artery calcification noted. Hepatobiliary: No focal liver abnormality is seen. No gallstones, gallbladder wall thickening, or biliary dilatation. Pancreas: Unremarkable. No pancreatic ductal dilatation or surrounding inflammatory changes. Spleen: Normal in size without focal abnormality. Adrenals/Urinary Tract: Adrenal glands are unremarkable. Kidneys are normal, without renal calculi, focal lesion, or hydronephrosis. Bladder is unremarkable. Stomach/Bowel: Interval appendectomy. Left lower quadrant surgical drainage catheter loops within the deep pelvis and courses along the right peritoneal wall the level of the mid abdomen. There is extensive infiltration within the surgical bed in keeping with residual inflammatory or postsurgical change. There is a crescentic loculated fluid collection within the deep pelvis extending to this area of inflammatory change superiorly containing several punctate locules of gas suspicious for a developing deep pelvic abscess measuring at least 3.8 x 3.9 x 6.5 cm in dimension on axial image # 76 and coronal image # 60. There is mild infiltration of the peritoneal fat within the right mid abdomen and pelvis as well as enhancement of the peritoneal lining which may reflect changes of peritonitis. The proximal and mid small bowel appears diffusely mildly  dilated and  fluid-filled without a discrete point of transition identified in keeping with changes of an adynamic ileus. No free intraperitoneal gas or fluid. Vascular/Lymphatic: Extensive aortoiliac atherosclerotic calcification. No aortic aneurysm. No pathologic adenopathy within the abdomen and pelvis. Reproductive: Prostate is unremarkable. Other: There is extensive infiltration and punctate foci of subcutaneous gas in the region of the umbilicus likely reflecting postsurgical changes following laparoscopic surgery. No discrete subcutaneous fluid collection identified. No hernia identified. Musculoskeletal: No acute bone abnormality. No lytic or blastic bone lesion. IMPRESSION: 1. Interval appendectomy. Crescentic fluid collection within the deep pelvis extending to this area of inflammatory change within the surgical bed containing several punctate locules of gas suspicious for a developing deep pelvic abscess measuring at least 3.8 x 3.9 x 6.5 cm in dimension. This does not communicate with the indwelling surgical drainage catheter. 2. Infiltration of peritoneal fat and peritoneal enhancement in keeping changes of peritonitis. 3. Postoperative adynamic ileus with multiple loops of dilated fluid-filled proximal and mid small bowel. 4. Small right pleural effusion. 5. Moderate right coronary artery calcification. Aortic Atherosclerosis (ICD10-I70.0) and Emphysema (ICD10-J43.9). Electronically Signed   By: Helyn Numbers M.D.   On: 05/04/2023 22:03   VAS [place] LOWER EXTREMITY VENOUS (DVT)  Result Date: 05/03/2023  Lower Venous DVT Study Patient Name:  Jared [name]  Date of Exam:   05/03/2023 Medical Rec #: [id number]     Accession #:    [id number] Date of Birth: [birth year]      Patient Gender: M Patient Age:   78 years Exam Location:  [place] Procedure:      VAS [place] LOWER EXTREMITY VENOUS (DVT) Referring Phys: Zenia Resides --------------------------------------------------------------------------------   Indications: Swelling, and Edema.  Risk Factors: Surgery Appendectomy 05/01/23. Comparison Study: No prior study Performing Technologist: Shona Simpson  Examination Guidelines: A complete evaluation includes B-mode imaging, spectral Doppler, color Doppler, and power Doppler as needed of all accessible portions of each vessel. Bilateral testing is considered an integral part of a complete examination. Limited examinations for reoccurring indications may be performed as noted. The reflux portion of the exam is performed with the patient in reverse Trendelenburg.  +---------+---------------+---------+-----------+----------+--------------+ RIGHT    CompressibilityPhasicitySpontaneityPropertiesThrombus Aging +---------+---------------+---------+-----------+----------+--------------+ CFV      Full           Yes      Yes                                 +---------+---------------+---------+-----------+----------+--------------+ SFJ      Full                                                        +---------+---------------+---------+-----------+----------+--------------+ FV Prox  Full                                                        +---------+---------------+---------+-----------+----------+--------------+ FV Mid   Full                                                        +---------+---------------+---------+-----------+----------+--------------+  FV DistalFull                                                        +---------+---------------+---------+-----------+----------+--------------+ PFV      Full                                                        +---------+---------------+---------+-----------+----------+--------------+ POP      Full           Yes      Yes                                 +---------+---------------+---------+-----------+----------+--------------+ PTV      Full                                                         +---------+---------------+---------+-----------+----------+--------------+ PERO     Full                                                        +---------+---------------+---------+-----------+----------+--------------+   +---------+---------------+---------+-----------+----------+--------------+ LEFT     CompressibilityPhasicitySpontaneityPropertiesThrombus Aging +---------+---------------+---------+-----------+----------+--------------+ CFV      Full           Yes      Yes                                 +---------+---------------+---------+-----------+----------+--------------+ SFJ      Full                                                        +---------+---------------+---------+-----------+----------+--------------+ FV Prox  Full                                                        +---------+---------------+---------+-----------+----------+--------------+ FV Mid   Full                                                        +---------+---------------+---------+-----------+----------+--------------+ FV DistalFull                                                        +---------+---------------+---------+-----------+----------+--------------+  PFV      Full                                                        +---------+---------------+---------+-----------+----------+--------------+ POP      Full           Yes      Yes                                 +---------+---------------+---------+-----------+----------+--------------+ PTV      Full                                                        +---------+---------------+---------+-----------+----------+--------------+ PERO     Full                                                        +---------+---------------+---------+-----------+----------+--------------+     Summary: BILATERAL: - No evidence of deep vein thrombosis seen in the lower extremities, bilaterally. -No evidence of  popliteal cyst, bilaterally.   *See table(s) above for measurements and observations. Electronically signed by Gerarda Fraction on 05/03/2023 at 3:40:10 PM.    Final    Overnight EEG with video  Result Date: 05/03/2023 Charlsie Quest, MD     05/03/2023  7:38 PM Patient Name: Jared [name] MRN: [id number] Epilepsy Attending: Charlsie Quest Referring Physician/Provider: Gevena Mart, NP Duration: 05/02/2023 1937 to 05/03/2023 1550 Patient history: 67 yo M with arm tremors, hx of tremors with LOC, frequency increased in the last 3 months, had episode today without LOC. EEG to evaluate for seizure. Level of alertness: Awake, asleep AEDs during EEG study: GBP Technical aspects: This EEG study was done with scalp electrodes positioned according to the 10-20 International system of electrode placement. Electrical activity was reviewed with band pass filter of 1-70Hz , sensitivity of 7 uV/mm, display speed of 73mm/sec with a 60Hz  notched filter applied as appropriate. EEG data were recorded continuously and digitally stored.  Video monitoring was available and reviewed as appropriate. Description: The posterior dominant rhythm consists of 8 Hz activity of moderate voltage (25-35 uV) seen predominantly in posterior head regions, symmetric and reactive to eye opening and eye closing. Sleep was characterized by vertex waves, sleep spindles (12 to 14 Hz), maximal frontocentral region.  Event button was pressed over 50 times after 05/03/2023 0416 for tremors of the upper extremity. Concomitant EEG before, during and after the event showed normal posterior dominant rhythm, did not show any EEG changes suggest seizure. Hyperventilation and photic stimulation were not performed.   IMPRESSION: This study is within normal limits. No seizures or epileptiform discharges were seen throughout the recording. Event button was pressed several times during the study for tremors of upper extremity without concomitant EEG change.  These  events are nonepileptic. Charlsie Quest   NM Pulmonary Perf and Vent  Result Date: 05/02/2023 CLINICAL DATA:  Evaluate for pulmonary embolism. EXAM: NUCLEAR MEDICINE PERFUSION  LUNG SCAN TECHNIQUE: Perfusion images were obtained in multiple projections after intravenous injection of radiopharmaceutical. Ventilation scans intentionally deferred if perfusion scan and chest x-ray adequate for interpretation during COVID 19 epidemic. RADIOPHARMACEUTICALS:  4 mCi Tc-50m MAA IV COMPARISON:  Chest radiograph-earlier same day; 04/30/2023 Chest CT-07/12/2022 FINDINGS: Review of chest radiograph performed earlier same day demonstrates unchanged enlarged cardiac silhouette and mediastinal contours. Old left-sided rib fractures. No focal airspace opacities. No pleural effusion or pneumothorax. No evidence of edema Perfusion imaging demonstrates mild heterogeneous perfusion of the bilateral pulmonary parenchyma without discrete area of non perfusion to suggest pulmonary embolism. IMPRESSION: Pulmonary embolism absent. No discrete areas of non perfusion to suggest pulmonary embolism. Electronically Signed   By: Simonne Come M.D.   On: 05/02/2023 17:35   DG CHEST PORT 1 VIEW  Result Date: 05/02/2023 CLINICAL DATA:  Dyspnea EXAM: PORTABLE CHEST 1 VIEW COMPARISON:  None Available. FINDINGS: Normal mediastinum and cardiac silhouette. Normal pulmonary vasculature. No evidence of effusion, infiltrate, or pneumothorax. No acute bony abnormality. IMPRESSION: Normal chest radiograph Electronically Signed   By: Genevive Bi M.D.   On: 05/02/2023 16:32   VAS [place] IVC/ILIAC (VENOUS ONLY)  Result Date: 05/02/2023 IVC/ILIAC STUDY Patient Name:  Jared [name]  Date of Exam:   05/02/2023 Medical Rec #: [id number]     Accession #:    [id number] Date of Birth: [birth year]      Patient Gender: M Patient Age:   31 years Exam Location:  [facility] Procedure:      VAS [place] IVC/ILIAC (VENOUS ONLY) Referring Phys: Jonah Blue  --------------------------------------------------------------------------------  Indications: A vague density area of concern in the IVC was noted on echo exam. Limitations: Air/bowel gas and Status post appendectomy laparoscopic 05/01/23.  Performing Technologist: Marilynne Halsted RDMS, RVT  Examination Guidelines: A complete evaluation includes B-mode imaging, spectral Doppler, color Doppler, and power Doppler as needed of all accessible portions of each vessel. Bilateral testing is considered an integral part of a complete examination. Limited examinations for reoccurring indications may be performed as noted.  IVC/Iliac Findings: +----------+------+--------+-----------------+    IVC    PatentThrombus    Comments      +----------+------+--------+-----------------+ IVC Prox                poorly visualized +----------+------+--------+-----------------+ IVC Mid                 poorly visualized +----------+------+--------+-----------------+ IVC Distal              not visualized    +----------+------+--------+-----------------+    Summary: IVC/Iliac: Visualization of proximal Inferior Vena Cava, mid inferior vena cava and distal Inferior Vena Cava was limited. 2D imaging with color evaluation of the IVC was non- diagnostic due to patient's body habitus and air in the abdominal cavity.  *See table(s) above for measurements and observations.  Electronically signed by Carolynn Sayers on 05/02/2023 at 4:10:22 PM.   Final    ECHOCARDIOGRAM COMPLETE  Result Date: 05/02/2023    ECHOCARDIOGRAM REPORT   Patient Name:   Jared [name] Date of Exam: 05/02/2023 Medical Rec #:  [id number]    Height:       75.0 in Accession #:    [id number]   Weight:       219.0 lb Date of Birth:  07-Feb-1956     BSA:          2.281 m Patient Age:    67 years     BP:  107/69 mmHg Patient Gender: M            HR:           104 bpm. Exam Location:  Inpatient Procedure: 2D Echo, Color Doppler, Cardiac Doppler and  Intracardiac            Opacification Agent Indications:    R55 Syncope  History:        Patient has prior history of Echocardiogram examinations, most                 recent 12/13/2021. CAD, COPD, Arrythmias:Atrial Fibrillation; Risk                 Factors:Hypertension and Diabetes.  Sonographer:    Irving Burton Senior RDCS Referring Phys: [id number] Francena Hanly [facility]  Sonographer Comments: Very poor echo windows due to body habitus and COPD IMPRESSIONS  1. Left ventricular ejection fraction, by estimation, is 55 to 60%. The left ventricle has normal function. The left ventricle has no regional wall motion abnormalities. Left ventricular diastolic parameters are consistent with Grade II diastolic dysfunction (pseudonormalization).  2. Right ventricular systolic function is moderately reduced. The right ventricular size is moderately enlarged. Tricuspid regurgitation signal is inadequate for assessing PA pressure.  3. Left atrial size was mildly dilated.  4. The mitral valve is degenerative. No evidence of mitral valve regurgitation. No evidence of mitral stenosis.  5. The aortic valve is tricuspid. Aortic valve regurgitation is not visualized. Aortic valve sclerosis/calcification is present, without any evidence of aortic stenosis.  6. There is a vague density in the IVC. Cannot rule out thrombus. Recommend dedicated Abdominal CTA vs. Abdominal US for further evaluation. The inferior vena cava is dilated in size with >50% respiratory variability, suggesting right atrial pressure of  8 mmHg.  7. In setting of syncope as well as RV dysfunction, consider acute PE. Recommend Chest CTA if clinically indicated. FINDINGS  Left Ventricle: Left ventricular ejection fraction, by estimation, is 55 to 60%. The left ventricle has normal function. The left ventricle has no regional wall motion abnormalities. Definity contrast agent was given IV to delineate the left ventricular  endocardial borders. The left ventricular internal cavity size  was normal in size. There is no left ventricular hypertrophy. Left ventricular diastolic parameters are consistent with Grade II diastolic dysfunction (pseudonormalization). Normal left ventricular filling pressure. Right Ventricle: The right ventricular size is moderately enlarged. No increase in right ventricular wall thickness. Right ventricular systolic function is moderately reduced. Tricuspid regurgitation signal is inadequate for assessing PA pressure. Left Atrium: Left atrial size was mildly dilated. Right Atrium: Right atrial size was normal in size. Pericardium: There is no evidence of pericardial effusion. Presence of epicardial fat layer. Mitral Valve: The mitral valve is degenerative in appearance. There is mild calcification of the mitral valve leaflet(s). Mild to moderate mitral annular calcification. No evidence of mitral valve regurgitation. No evidence of mitral valve stenosis. Tricuspid Valve: The tricuspid valve is normal in structure. Tricuspid valve regurgitation is trivial. No evidence of tricuspid stenosis. Aortic Valve: The aortic valve is tricuspid. Aortic valve regurgitation is not visualized. Aortic valve sclerosis/calcification is present, without any evidence of aortic stenosis. Pulmonic Valve: The pulmonic valve was normal in structure. Pulmonic valve regurgitation is not visualized. No evidence of pulmonic stenosis. Aorta: The aortic root is normal in size and structure. Venous: There is a vague density in the IVC. Cannot rule out thrombus. Recommend dedicated Abdominal CTA vs. Abdominal US for further evaluation. The inferior  vena cava is dilated in size with greater than 50% respiratory variability, suggesting right atrial pressure of 8 mmHg. IAS/Shunts: No atrial level shunt detected by color flow Doppler.  LEFT VENTRICLE PLAX 2D LVIDd:         4.90 cm     Diastology LVIDs:         3.80 cm     LV e' medial:    6.20 cm/s LV PW:         1.10 cm     LV E/e' medial:  10.9 LV IVS:         1.00 cm     LV e' lateral:   6.31 cm/s LVOT diam:     2.10 cm     LV E/e' lateral: 10.7 LV SV:         47 LV SV Index:   21 LVOT Area:     3.46 cm  LV Volumes (MOD) LV vol d, MOD A4C: 98.9 ml LV vol s, MOD A4C: 57.8 ml LV SV MOD A4C:     98.9 ml RIGHT VENTRICLE RV S prime:     9.36 cm/s TAPSE (M-mode): 1.9 cm LEFT ATRIUM             Index        RIGHT ATRIUM           Index LA diam:        3.30 cm 1.45 cm/m   RA Area:     23.70 cm LA Vol (A2C):   80.6 ml 35.34 ml/m  RA Volume:   71.30 ml  31.26 ml/m LA Vol (A4C):   74.4 ml 32.62 ml/m LA Biplane Vol: 81.7 ml 35.82 ml/m  AORTIC VALVE LVOT Vmax:   88.10 cm/s LVOT Vmean:  65.100 cm/s LVOT VTI:    0.135 m  AORTA Ao Root diam: 3.40 cm MITRAL VALVE MV Area (PHT): 3.39 cm    SHUNTS MV Decel Time: 224 msec    Systemic VTI:  0.14 m MV E velocity: 67.30 cm/s  Systemic Diam: 2.10 cm MV A velocity: 60.80 cm/s MV E/A ratio:  1.11 Armanda Magic MD Electronically signed by Armanda Magic MD Signature Date/Time: 05/02/2023/12:22:10 PM    Final    DG Chest Port 1 View  Result Date: 04/30/2023 CLINICAL DATA:  Shortness of breath and headache EXAM: PORTABLE CHEST 1 VIEW COMPARISON:  12/26/2022 FINDINGS: Numerous leads and wires project over the chest. Remote left rib fractures. Midline trachea. Borderline cardiomegaly. Atherosclerosis in the transverse aorta. Left costophrenic angle excluded. No pleural effusion or pneumothorax. Chronic interstitial thickening/coarsening there is likely related to COPD/chronic bronchitis given clinical history of smoking. No lobar consolidation. IMPRESSION: No acute cardiopulmonary disease. Peribronchial thickening which may relate to chronic bronchitis or smoking. Aortic Atherosclerosis (ICD10-I70.0). Electronically Signed   By: Jeronimo Greaves M.D.   On: 04/30/2023 16:55   CT ABDOMEN PELVIS W CONTRAST  Result Date: 04/30/2023 CLINICAL DATA:  Right lower quadrant pain, no bowel movement since last Thursday, testicular pain EXAM: CT  ABDOMEN AND PELVIS WITH CONTRAST TECHNIQUE: Multidetector CT imaging of the abdomen and pelvis was performed using the standard protocol following bolus administration of intravenous contrast. RADIATION DOSE REDUCTION: This exam was performed according to the departmental dose-optimization program which includes automated exposure control, adjustment of the mA and/or kV according to patient size and/or use of iterative reconstruction technique. CONTRAST:  60mL OMNIPAQUE IOHEXOL 350 MG/ML SOLN COMPARISON:  11/18/2020 FINDINGS: Lower chest: Emphysema. No acute pleural or  parenchymal lung disease. Hepatobiliary: No focal liver abnormality is seen. No gallstones, gallbladder wall thickening, or biliary dilatation. Pancreas: Unremarkable. No pancreatic ductal dilatation or surrounding inflammatory changes. Spleen: Normal in size without focal abnormality. Adrenals/Urinary Tract: Adrenal glands are unremarkable. Kidneys are normal, without renal calculi, focal lesion, or hydronephrosis. Bladder is unremarkable. Stomach/Bowel: There is a dilated inflamed appendix within the right lower quadrant, measuring up to 16 mm in diameter. And appendicolith is seen at the appendiceal orifice. Marked periappendiceal fat stranding with evidence of micro perforation at the base of the appendix. No fluid collection or abscess. No bowel obstruction or ileus. Vascular/Lymphatic: Subcentimeter right lower quadrant mesenteric lymph nodes are likely reactive. No pathologic adenopathy. Atherosclerosis of the aorta and its branches. Reproductive: Prostate is unremarkable. Other: Mesenteric edema and trace free fluid within the right lower quadrant. Punctate extraluminal gas adjacent to the base of the appendix consistent with micro perforation. No other evidence of pneumoperitoneum. No abdominal wall hernia. Musculoskeletal: No acute or destructive bony abnormalities. Reconstructed images demonstrate no additional findings. IMPRESSION: 1.  Acute perforated appendicitis.  No fluid collection or abscess. 2.  Aortic Atherosclerosis (ICD10-I70.0). Critical Value/emergent results were called by telephone at the time of interpretation on 04/30/2023 at 4:29 pm to provider [facility], who verbally acknowledged these results. Electronically Signed   By: Sharlet Salina M.D.   On: 04/30/2023 16:32   US SCROTUM W/DOPPLER  Result Date: 04/30/2023 CLINICAL DATA:  Left testicular pain EXAM: SCROTAL ULTRASOUND DOPPLER ULTRASOUND OF THE TESTICLES TECHNIQUE: Complete ultrasound examination of the testicles, epididymis, and other scrotal structures was performed. Color and spectral Doppler ultrasound were also utilized to evaluate blood flow to the testicles. COMPARISON:  11/18/2020 FINDINGS: Right testicle Measurements: 2.5 x 4.0 x 2.2 cm. No mass or microlithiasis visualized. Left testicle Measurements: 2.1 x 3.9 x 1.7 cm. No mass or microlithiasis visualized. Right epididymis: Incidental 3 mm epididymal cyst of doubtful clinical significance. Otherwise normal size and appearance. Left epididymis: Incidental 2 mm epididymal cyst of doubtful clinical significance. Otherwise normal size and appearance. Hydrocele:  None visualized. Varicocele:  None visualized. Pulsed Doppler interrogation of both testes demonstrates normal low resistance arterial and venous waveforms bilaterally. IMPRESSION: 1. Unremarkable testicular ultrasound. 2. Small bilateral epididymal cysts of likely no clinical significance. Electronically Signed   By: Sharlet Salina M.D.   On: 04/30/2023 16:11   CT Head Wo Contrast  Result Date: 04/30/2023 CLINICAL DATA:  frontal HA, sepsis EXAM: CT HEAD WITHOUT CONTRAST TECHNIQUE: Contiguous axial images were obtained from the base of the skull through the vertex without intravenous contrast. RADIATION DOSE REDUCTION: This exam was performed according to the departmental dose-optimization program which includes automated exposure control,  adjustment of the mA and/or kV according to patient size and/or use of iterative reconstruction technique. COMPARISON:  None Available. FINDINGS: Brain: No hemorrhage. No hydrocephalus. No extra-axial fluid collection. No CT evidence of an acute cortical infarct. No mass effect. No mass lesion. Mineralization of the basal ganglia bilaterally. Vascular: No hyperdense vessel or unexpected calcification. Skull: Normal. Negative for fracture or focal lesion. Sinuses/Orbits: No middle ear effusion. There is a trace left mastoid effusion. Paranasal sinuses are clear. Bilateral lens replacement. Orbits are otherwise unremarkable. Other: None. IMPRESSION: 1. No CT finding to explain frontal headache. 2. Trace left mastoid effusion. Electronically Signed   By: Lorenza Cambridge M.D.   On: 04/30/2023 14:44    Labs:  CBC: Recent Labs    05/02/23 0755 05/03/23 0657 05/04/23 0550 05/05/23 5409  WBC 10.2 12.9* 14.5* 15.9*  HGB 9.6* 8.8* 8.2* 8.3*  HCT 33.2* 30.5* 27.9* 28.5*  PLT 355 359 375 440*    COAGS: Recent Labs    04/30/23 1144 05/05/23 0848  INR 1.7* 1.0  APTT 45*  --     BMP: Recent Labs    05/02/23 0755 05/03/23 0657 05/04/23 0550 05/05/23 0848  NA 135 131* 131* 134*  K 4.3 4.2 3.9 3.8  CL 96* 94* 92* 93*  CO2 30 31 29 27   GLUCOSE 193* 148* 150* 113*  BUN 14 23 19 12   CALCIUM 8.4* 8.1* 8.3* 8.6*  CREATININE 1.61* 1.93* 1.44* 1.07  GFRNONAA 46* 37* 53* >60    LIVER FUNCTION TESTS: Recent Labs    12/26/22 0939 04/30/23 1144 05/02/23 0755 05/03/23 1523 05/04/23 0550  BILITOT 0.4 1.3* 0.4 0.5  --   AST 23 32 21 36  --   ALT 21 18 14 16   --   ALKPHOS 66 72 57 100  --   PROT 6.5 7.3 6.0* 6.1*  --   ALBUMIN 3.5 3.7 2.7* 2.4* 2.2*    TUMOR MARKERS: No results for input(s): "AFPTM", "CEA", "CA199", "CHROMGRNA" in the last 8760 hours.  Assessment and Plan: 67 y.o. male with recent perforated appendicitis s/p appendectomy who presents with worsening leukocytosis and  pelvic fluid collection. He is in need of drainage.   Was on CLD, changed to NPO. Last po intake around 8/8:30 today.  VS BP wnl/ tachycardic 104, 100% on 2L CBC WBC 15.9, hgb 8.3, plt 440 INR 1.0 On heparin infusion, to be stopped at 12 pm per Dr. Elby Showers.   Risks and benefits discussed with the patient including bleeding, infection, damage to adjacent structures, bowel perforation/fistula connection, and sepsis.  All of the patient's questions were answered, patient is agreeable to proceed.  Patient is unable to state correct time.  Discussed with RN, this is new since this morning, patient was able to answer all questions correctly this morning.  Both sister and son called to discuss the benefit and risks of the procedure, no answer.   Currently unable to obtain informed consent at 1057 hrs.   The procedure is scheduled for 1 pm today, if consent can be obtained.  Pt to remain NPO Heparin infusion to be stopped at 12 pm, pharmacy and RN notified.  IR/CT team will call for the patient when ready.  ADDENDUM: Notified by RN and patient seen by attending provider, he was able to answer all orientation questions appropriately and he is in agreement to proceed with the drain placement.  Discussed with Dr. Elby Showers, will proceed with drain placement around 1 pm.  Will obtain signature when patient is in CT.   Thank you for this interesting consult.  I greatly enjoyed meeting [facility] and look forward to participating in their care.  A copy of this report was sent to the requesting provider on this date.  Electronically Signed: Willette Brace, PA-C 05/05/2023, 10:58 AM   I spent a total of 40 Minutes    in face to face in clinical consultation, greater than 50% of which was counseling/coordinating care for pelvic fluid collection.   This chart was dictated using voice recognition software.  Despite best efforts to proofread,  errors can occur which can change the documentation meaning.

## 2023-05-05 NOTE — Sedation Documentation (Signed)
Patient transported to [facility]M ICU with Hella RN and [employer]. Patient currently attached to cardiac monitor on 15 L NRB. Patient is more responsive at this time. Bedside report given to [facility]. Handoff given.

## 2023-05-05 NOTE — Progress Notes (Signed)
PHARMACY - ANTICOAGULATION CONSULT NOTE  Pharmacy Consult for heparin  Indication: pulmonary embolus, suspected  Allergies  Allergen Reactions   Lisinopril Cough    Patient Measurements: Height: 6\' 3"  (190.5 cm) Weight: 99.3 kg (219 lb) IBW/kg (Calculated) : 84.5 Heparin Dosing Weight: 99.3 kg   Vital Signs: Temp: 97.5 F (36.4 C) (11/23 2003) Temp Source: Oral (11/23 2003) BP: 122/68 (11/23 2003) Pulse Rate: 81 (11/23 2003)  Labs: Recent Labs    05/02/23 0755 05/02/23 1717 05/02/23 1718 05/02/23 1906 05/03/23 0657 05/04/23 0550 05/04/23 1533 05/05/23 0053  HGB 9.6*  --   --   --  8.8* 8.2*  --   --   HCT 33.2*  --   --   --  30.5* 27.9*  --   --   PLT 355  --   --   --  359 375  --   --   HEPARINUNFRC  --   --   --    < > 0.62 0.33 0.21* 0.43  CREATININE 1.61*  --   --   --  1.93* 1.44*  --   --   TROPONINIHS  --  37* 40*  --   --   --   --   --    < > = values in this interval not displayed.    Estimated Creatinine Clearance: 57.9 mL/min (A) (by C-G formula based on SCr of 1.44 mg/dL (H)).   Assessment: 67 yo M with suspected PE from ECHO findings. V/Q scan neg for PE. Vague density in the IVC. Cannot rule out thrombus. Pt is s/p appendectomy with drain placement on 11/20.   11/24 AM: heparin level returned at 0.43 on 1900 units/hr (therapeutic). Per RN, no issues with the heparin infusion running continuously or signs/symptoms of bleeding. Last CBC shows Hgb 8.2 and plts 300s  Goal of Therapy:  Heparin level 0.3-0.7 units/ml Monitor platelets by anticoagulation protocol: Yes   Plan:  Continue heparin infusion to 1900 units/hr Check confirmatory heparin level with AM labs and daily while on heparin Continue to monitor H&H and platelets   Thank you for allowing pharmacy to be a part of this patient's care.  Arabella Merles, PharmD. Clinical Pharmacist 05/05/2023 1:36 AM

## 2023-05-05 NOTE — Sedation Documentation (Signed)
Hella RN Rapid response at the bedside to evaluate patient.

## 2023-05-05 NOTE — Progress Notes (Signed)
PHARMACY - ANTICOAGULATION CONSULT NOTE  Pharmacy Consult for heparin  Indication: pulmonary embolus, suspected  Allergies  Allergen Reactions   Lisinopril Cough    Patient Measurements: Height: 6\' 3"  (190.5 cm) Weight: 99.3 kg (219 lb) IBW/kg (Calculated) : 84.5 Heparin Dosing Weight: 99.3 kg   Vital Signs: Temp: 98 F (36.7 C) (11/24 0458) Temp Source: Oral (11/24 0458) BP: 170/100 (11/24 1320) Pulse Rate: 122 (11/24 1320)  Labs: Recent Labs    05/02/23 1717 05/02/23 1718 05/02/23 1906 05/03/23 0657 05/04/23 0550 05/04/23 1533 05/05/23 0053 05/05/23 0633 05/05/23 0848  HGB  --   --    < > 8.8* 8.2*  --   --  8.3*  --   HCT  --   --   --  30.5* 27.9*  --   --  28.5*  --   PLT  --   --   --  359 375  --   --  440*  --   LABPROT  --   --   --   --   --   --   --   --  13.7  INR  --   --   --   --   --   --   --   --  1.0  HEPARINUNFRC  --   --    < > 0.62 0.33 0.21* 0.43 0.49  --   CREATININE  --   --   --  1.93* 1.44*  --   --   --  1.07  TROPONINIHS 37* 40*  --   --   --   --   --   --   --    < > = values in this interval not displayed.    Estimated Creatinine Clearance: 77.9 mL/min (by C-G formula based on SCr of 1.07 mg/dL).   Assessment: 67 yo M with suspected PE from ECHO findings. V/Q scan neg for PE. Vague density in the IVC. Cannot rule out thrombus. Pt is s/p appendectomy with drain placement on 11/20.   11/24 AM: heparin level returned at 0.43 on 1900 units/hr (therapeutic). Per RN, no issues with the heparin infusion running continuously or signs/symptoms of bleeding. Last CBC shows Hgb 8.2 and plts 300s  05/05/2023 AM update: HL 0.49 Hgb 8.3 RN called (overnight shift) stating abdominal incision starting oozing a bit, nothing very significant atm, provider aware and plans to cont hep Currently, no more signs of bleeding from site.  No issues with heparin gtt reported from nursing  05/05/23 ~1130 Heparin gtt was held at 12:00 in prep for a CT  guided transgluteal pelvic drain placement. This occurred around 13:15  Goal of Therapy:  Heparin level 0.3-0.7 units/ml Monitor platelets by anticoagulation protocol: Yes   Plan:  Restart heparin infusion to 1900 units/hr 05/05/23 at 15:30 Check heparin level, H/H, PLT daily while on heparin Follow- up on oral [facility] plans   Thank you for allowing pharmacy to be a part of this patient's care.  Greta Doom BS, PharmD, BCPS Clinical Pharmacist 05/05/2023 1:36 PM  Contact: [phone] after 3 PM  "Be curious, not judgmental..." -Debbora Dus

## 2023-05-05 NOTE — Significant Event (Signed)
Rapid Response Event Note   Reason for Call :  Respiratory distress post procedure  Per staff initially had O2 sats in the 70s, placed on 100% NRB  Initial Focused Assessment:  Increased work of breathing, using accessory muscles.  Only able to speak one word at a time.  Lungs sounds diminished through out.  Some stridor.  Heart tones occasionally irregular.  BP 156/86  ST with occasional PAC 120s  RR 22  O2 sat 100% on NRB  Temp 99  Interventions:  PCXR ABG CCM consulted  (Whitney NP at bedside)  Transferred to 4U98  Plan of Care:     Event Summary:   MD Notified: Dr Elby Showers, Dr Ophelia Charter Call Time: 1330 Arrival Time: 1334 End Time: 1415  Marcellina Millin, RN

## 2023-05-05 NOTE — Progress Notes (Signed)
Assessment & Plan: POD#4 - lap appy for perforated appendicitis - MW - WBC 15.9 and rising - CT scan with 6 cm pelvic abscess - ordered IR consult for drain placement - AKI, creatinine improving - nephrology following - CLD - will make NPO when IR schedules drain placement - oob/pulm toilet; encouraged ambulation in halls - continue IV Zosyn   FEN: CLD, IVF ID: zosyn 11/19> 11/25 VTE: hold xarelto. SCDs. Hep gtt   Appeciate TRH assistance Syncope COPD/Chronic resp failure - on 2 lpm at baseline HTN Atrial flutter - xarelto held, on heparin gtts H/o CVA PVD        Jared Level, MD [facility] A [facility] practice Office: [phone]        Chief Complaint: Perforated appendicitis  Subjective: Patient in bed, appears ill.  Responds to questions.  Some pain.  Objective: Vital signs in last 24 hours: Temp:  [97.5 F (36.4 C)-98.1 F (36.7 C)] 98 F (36.7 C) (11/24 0458) Pulse Rate:  [81-95] 95 (11/24 0458) Resp:  [18-20] 20 (11/24 0458) BP: (111-140)/(68-73) 140/73 (11/24 0458) SpO2:  [100 %] 100 % (11/24 0458) Last BM Date : 05/02/23  Intake/Output from previous day: 11/23 0701 - 11/24 0700 In: 625.6 [P.O.:400; I.V.:225.6] Out: 1770 [Urine:1755; Drains:15] Intake/Output this shift: No intake/output data recorded.  Physical Exam: HEENT - sclerae clear, mucous membranes moist Neck - soft Abdomen - moderate distension; mild diffuse tenderness; JP with serosanguinous output; dressing dry and intact  Lab Results:  Recent Labs    05/04/23 0550 05/05/23 0633  WBC 14.5* 15.9*  HGB 8.2* 8.3*  HCT 27.9* 28.5*  PLT 375 440*   BMET Recent Labs    05/03/23 0657 05/04/23 0550  NA 131* 131*  K 4.2 3.9  CL 94* 92*  CO2 31 29  GLUCOSE 148* 150*  BUN 23 19  CREATININE 1.93* 1.44*  CALCIUM 8.1* 8.3*   PT/INR No results for input(s): "LABPROT", "INR" in the last 72 hours. Comprehensive Metabolic Panel:    Component Value Date/Time   NA  131 (L) 05/04/2023 0550   NA 131 (L) 05/03/2023 0657   NA 142 10/25/2022 1027   NA 145 (H) 12/18/2021 1121   K 3.9 05/04/2023 0550   K 4.2 05/03/2023 0657   CL 92 (L) 05/04/2023 0550   CL 94 (L) 05/03/2023 0657   CO2 29 05/04/2023 0550   CO2 31 05/03/2023 0657   BUN 19 05/04/2023 0550   BUN 23 05/03/2023 0657   BUN 14 10/25/2022 1027   BUN 26 12/18/2021 1121   CREATININE 1.44 (H) 05/04/2023 0550   CREATININE 1.93 (H) 05/03/2023 0657   CREATININE 1.20 07/30/2016 1431   CREATININE 1.04 03/02/2016 0813   GLUCOSE 150 (H) 05/04/2023 0550   GLUCOSE 148 (H) 05/03/2023 0657   CALCIUM 8.3 (L) 05/04/2023 0550   CALCIUM 8.1 (L) 05/03/2023 0657   AST 36 05/03/2023 1523   AST 21 05/02/2023 0755   ALT 16 05/03/2023 1523   ALT 14 05/02/2023 0755   ALKPHOS 100 05/03/2023 1523   ALKPHOS 57 05/02/2023 0755   BILITOT 0.5 05/03/2023 1523   BILITOT 0.4 05/02/2023 0755   BILITOT 0.3 10/25/2022 1027   BILITOT 0.3 09/20/2021 1620   PROT 6.1 (L) 05/03/2023 1523   PROT 6.0 (L) 05/02/2023 0755   PROT 6.7 10/25/2022 1027   PROT 7.5 09/20/2021 1620   ALBUMIN 2.2 (L) 05/04/2023 0550   ALBUMIN 2.4 (L) 05/03/2023 1523   ALBUMIN 4.4 10/25/2022  1027   ALBUMIN 5.0 (H) 09/20/2021 1620    Studies/Results: CT ABDOMEN PELVIS WO CONTRAST  Result Date: 05/04/2023 CLINICAL DATA:  Peritonitis, bowel perforation, right lower quadrant abdominal pain EXAM: CT ABDOMEN AND PELVIS WITHOUT CONTRAST TECHNIQUE: Multidetector CT imaging of the abdomen and pelvis was performed following the standard protocol without IV contrast. RADIATION DOSE REDUCTION: This exam was performed according to the departmental dose-optimization program which includes automated exposure control, adjustment of the mA and/or kV according to patient size and/or use of iterative reconstruction technique. COMPARISON:  04/30/2023 FINDINGS: Lower chest: Advanced emphysema noted within the visualized lung bases. Small right pleural effusion has  developed. Hypoattenuation of the cardiac blood pool is in keeping with at least mild anemia. Moderate right coronary artery calcification noted. Hepatobiliary: No focal liver abnormality is seen. No gallstones, gallbladder wall thickening, or biliary dilatation. Pancreas: Unremarkable. No pancreatic ductal dilatation or surrounding inflammatory changes. Spleen: Normal in size without focal abnormality. Adrenals/Urinary Tract: Adrenal glands are unremarkable. Kidneys are normal, without renal calculi, focal lesion, or hydronephrosis. Bladder is unremarkable. Stomach/Bowel: Interval appendectomy. Left lower quadrant surgical drainage catheter loops within the deep pelvis and courses along the right peritoneal wall the [name] of the mid abdomen. There is extensive infiltration within the surgical bed in keeping with residual inflammatory or postsurgical change. There is a crescentic loculated fluid collection within the deep pelvis extending to this area of inflammatory change superiorly containing several punctate locules of gas suspicious for a developing deep pelvic abscess measuring at least 3.8 x 3.9 x 6.5 cm in dimension on axial image # 76 and coronal image # 60. There is mild infiltration of the peritoneal fat within the right mid abdomen and pelvis as well as enhancement of the peritoneal lining which may reflect changes of peritonitis. The proximal and mid small bowel appears diffusely mildly dilated and fluid-filled without a discrete point of transition identified in keeping with changes of an adynamic ileus. No free intraperitoneal gas or fluid. Vascular/Lymphatic: Extensive aortoiliac atherosclerotic calcification. No aortic aneurysm. No pathologic adenopathy within the abdomen and pelvis. Reproductive: Prostate is unremarkable. Other: There is extensive infiltration and punctate foci of subcutaneous gas in the region of the umbilicus likely reflecting postsurgical changes following laparoscopic surgery.  No discrete subcutaneous fluid collection identified. No hernia identified. Musculoskeletal: No acute bone abnormality. No lytic or blastic bone lesion. IMPRESSION: 1. Interval appendectomy. Crescentic fluid collection within the deep pelvis extending to this area of inflammatory change within the surgical bed containing several punctate locules of gas suspicious for a developing deep pelvic abscess measuring at least 3.8 x 3.9 x 6.5 cm in dimension. This does not communicate with the indwelling surgical drainage catheter. 2. Infiltration of peritoneal fat and peritoneal enhancement in keeping changes of peritonitis. 3. Postoperative adynamic ileus with multiple loops of dilated fluid-filled proximal and mid small bowel. 4. Small right pleural effusion. 5. Moderate right coronary artery calcification. Aortic Atherosclerosis (ICD10-I70.0) and Emphysema (ICD10-J43.9). Electronically Signed   By: Helyn Numbers M.D.   On: 05/04/2023 22:03   VAS [place] LOWER EXTREMITY VENOUS (DVT)  Result Date: 05/03/2023  Lower Venous DVT Study Patient Name:  Jared [name]  Date of Exam:   05/03/2023 Medical Rec #: [id number]     Accession #:    [id number] Date of Birth: 1955-07-09      Patient Gender: M Patient Age:   28 years Exam Location:  [facility] Procedure:      VAS [place] LOWER EXTREMITY VENOUS (DVT)  Referring Phys: Zenia Resides --------------------------------------------------------------------------------  Indications: Swelling, and Edema.  Risk Factors: Surgery Appendectomy 05/01/23. Comparison Study: No prior study Performing Technologist: Shona Simpson  Examination Guidelines: A complete evaluation includes B-mode imaging, spectral Doppler, color Doppler, and power Doppler as needed of all accessible portions of each vessel. Bilateral testing is considered an integral part of a complete examination. Limited examinations for reoccurring indications may be performed as noted. The reflux portion of the exam is performed  with the patient in reverse Trendelenburg.  +---------+---------------+---------+-----------+----------+--------------+ RIGHT    CompressibilityPhasicitySpontaneityPropertiesThrombus Aging +---------+---------------+---------+-----------+----------+--------------+ CFV      Full           Yes      Yes                                 +---------+---------------+---------+-----------+----------+--------------+ SFJ      Full                                                        +---------+---------------+---------+-----------+----------+--------------+ FV Prox  Full                                                        +---------+---------------+---------+-----------+----------+--------------+ FV Mid   Full                                                        +---------+---------------+---------+-----------+----------+--------------+ FV DistalFull                                                        +---------+---------------+---------+-----------+----------+--------------+ PFV      Full                                                        +---------+---------------+---------+-----------+----------+--------------+ POP      Full           Yes      Yes                                 +---------+---------------+---------+-----------+----------+--------------+ PTV      Full                                                        +---------+---------------+---------+-----------+----------+--------------+ PERO     Full                                                        +---------+---------------+---------+-----------+----------+--------------+   +---------+---------------+---------+-----------+----------+--------------+  LEFT     CompressibilityPhasicitySpontaneityPropertiesThrombus Aging +---------+---------------+---------+-----------+----------+--------------+ CFV      Full           Yes      Yes                                  +---------+---------------+---------+-----------+----------+--------------+ SFJ      Full                                                        +---------+---------------+---------+-----------+----------+--------------+ FV Prox  Full                                                        +---------+---------------+---------+-----------+----------+--------------+ FV Mid   Full                                                        +---------+---------------+---------+-----------+----------+--------------+ FV DistalFull                                                        +---------+---------------+---------+-----------+----------+--------------+ PFV      Full                                                        +---------+---------------+---------+-----------+----------+--------------+ POP      Full           Yes      Yes                                 +---------+---------------+---------+-----------+----------+--------------+ PTV      Full                                                        +---------+---------------+---------+-----------+----------+--------------+ PERO     Full                                                        +---------+---------------+---------+-----------+----------+--------------+     Summary: BILATERAL: - No evidence of deep vein thrombosis seen in the lower extremities, bilaterally. -No evidence of popliteal cyst, bilaterally.   *See table(s) above for measurements and observations. Electronically signed by Gerarda Fraction on 05/03/2023 at 3:40:10 PM.  Final       Jared [name] 05/05/2023  Patient ID: Jared [name], male   DOB: [birth year], 67 y.o.   MRN: [id number]

## 2023-05-05 NOTE — Procedures (Signed)
Interventional Radiology Procedure Note  Procedure: CT guided transgluteal pelvic drain placement   Findings: Please refer to procedural dictation for full description. R transgluteal 8 Fr pigtail drain into pelvic abscess.  Approximately 75 mL purulent aspirate, sample sent for culture. Drain to bulb suction.  Complications: None immediate  Estimated Blood Loss: < 5 mL  Recommendations: Keep to bulb suction. Follow cultures. IR will follow.   Marliss Coots, MD

## 2023-05-05 NOTE — Progress Notes (Signed)
PHARMACY - ANTICOAGULATION CONSULT NOTE  Pharmacy Consult for heparin  Indication: pulmonary embolus, suspected  Allergies  Allergen Reactions   Lisinopril Cough    Patient Measurements: Height: 6\' 3"  (190.5 cm) Weight: 99.3 kg (219 lb) IBW/kg (Calculated) : 84.5 Heparin Dosing Weight: 99.3 kg   Vital Signs: Temp: 98 F (36.7 C) (11/24 0458) Temp Source: Oral (11/24 0458) BP: 140/73 (11/24 0458) Pulse Rate: 95 (11/24 0458)  Labs: Recent Labs    05/02/23 0755 05/02/23 1717 05/02/23 1718 05/02/23 1906 05/03/23 0657 05/04/23 0550 05/04/23 1533 05/05/23 0053 05/05/23 0633  HGB 9.6*  --   --   --  8.8* 8.2*  --   --  8.3*  HCT 33.2*  --   --   --  30.5* 27.9*  --   --  28.5*  PLT 355  --   --   --  359 375  --   --  440*  HEPARINUNFRC  --   --   --    < > 0.62 0.33 0.21* 0.43 0.49  CREATININE 1.61*  --   --   --  1.93* 1.44*  --   --   --   TROPONINIHS  --  37* 40*  --   --   --   --   --   --    < > = values in this interval not displayed.    Estimated Creatinine Clearance: 57.9 mL/min (A) (by C-G formula based on SCr of 1.44 mg/dL (H)).   Assessment: 67 yo M with suspected PE from ECHO findings. V/Q scan neg for PE. Vague density in the IVC. Cannot rule out thrombus. Pt is s/p appendectomy with drain placement on 11/20.   11/24 AM: heparin level returned at 0.43 on 1900 units/hr (therapeutic). Per RN, no issues with the heparin infusion running continuously or signs/symptoms of bleeding. Last CBC shows Hgb 8.2 and plts 300s  05/05/2023 AM update: HL 0.49 Hgb 8.3 RN called (overnight shift) stating abdominal incision starting oozing a bit, nothing very significant atm, provider aware and plans to cont hep Currently, no more signs of bleeding from site.  No issues with heparin gtt reported from nursing  Goal of Therapy:  Heparin level 0.3-0.7 units/ml Monitor platelets by anticoagulation protocol: Yes   Plan:  Continue heparin infusion to 1900  units/hr Check heparin level, H/H, PLT daily while on heparin Follow- up on oral AC plans   Thank you for allowing pharmacy to be a part of this patient's care.  Greta Doom BS, PharmD, BCPS Clinical Pharmacist 05/05/2023 7:49 AM  Contact: [phone] after 3 PM  "Be curious, not judgmental..." -Debbora Dus

## 2023-05-05 NOTE — Progress Notes (Signed)
Progress Note   Patient: Jared [name] JXB:[id number] DOB: 19-Oct-1955 DOA: 04/30/2023     5 DOS: the patient was seen and examined on 05/05/2023   Brief hospital course: 67yo with h/o chronic combined CHF, HTN, CVA, afib on Xarelto, PVD, and COPD on 2L home O2 who presented on 11/19 with SOB and other complaints.  Fever 100.9, WBC 13.4, lactate 2.9, Creatinine 1.65 with AKI.  CT A?P with acute perforated appendicitis with microperforation.  Admitted for IV antibiotics, Xarelto washout.  Surgery is consulting.  Underwent lap appy on 11/20 but slow recovery.  Repeat CT with abscess and concern for peritonitis.  Assessment and Plan:  Sepsis due to acute perforated appendicitis with microperforation Surgery consulted and recommended holding Xarelto and keeping n.p.o. except for sips with meds Underwent lap appy on 11/20 - visible appendiceal perforation noted with abscess that was drained and fecaliths removed; surgical drain was left in place Continue IV Zosyn Blood cultures pending x 4 days Surgery assumed care post-operatively but given complications post-operatively he will return to the [facility] service Abdomen remains sore and distended with drain in LLQ There is some concern today for ileus vs. Abscess so surgery is continuing to closely follow Worsening leukocytosis with high risk of complications, repeat CT performed with deep abscess appreciated,, concern for peritonitis - surgery notified this AM. Went to IR for transgluteal pelvic drain placement, 75cc aspirate, sent for culture Unresponsiveness post-procedure, slowly improving - but will transfer to ICU at this time for persistent sepsis TRH is happy to resume caring for the patient post-ICU   AKI (acute kidney injury)  Prerenal AKI on presentation with elevated creatinine of 1.69 from prior of 1 Resolved with IVF Recurred post-operatively on 11/21 and so IVF resumed Worsening AKI 11/22, resolved again with IVF Nephrology consulted and  has signed off   Syncope and collapse Patient reports 2 years of intermittent generalized body shaking with syncope and loss of consciousness that are often positional when going from sitting to standing. Last echocardiogram was 12/2021 with EF of 45 to 50% and global hypokinesis, grade 1 diastolic dysfunction.  No significant valvular abnormality. Repeat echo shows preserved EF, grade 2 diastolic dysfunction with moderate reduction in RV function and RV dilatation and a vague density in the IVC, concerning for possible PE/IVC thrombus STAT VQ scan negative for PE  Abdominal US ordered to rule out IVC thrombus but was non-diagnostic due to body habitus and air in abdominal cavity DVT US negative Cardiology consulted Will continue to treat empirically with Heparin for now 21 beat run of NSVT on 11/23  Tremors LTM EEG with non-epileptic spells Neurology consulted Recommend NOT increasing Oxy or gabapentin   IDA Patient with h/o iron deficiency anemia Receives weekly IV iron infusions, due today Hgb 8.8 on presentation, down from 10/6 on 11/1 Given IV iron dose on 11/21 as per patient request Hgb improved to 9.6 on 11/22 post-iron   Chronic respiratory failure with hypoxia  Secondary to COPD on 2L Stable although with wheezing on exam at time of admission (none this AM) PRN duoneb, Albuterol Continue Breztri   Essential hypertension Continue hydralazine, Imdur, metoprolol Holding Lasix due to recurrent AKI   HLD Continue Zetia, atorvastatin   Atrial flutter  Continue Toprol XL Holding Xarelto due to surgery Starting Heparin infusion on 11/21 Consider transition back to Xarelto if abdominal CT is unremarkable   Chronic diastolic CHF Echo 11/21 with preserved EF and grade 2 diastolic dysfunction Concern for IVC thrombus  History of CVA (cerebrovascular accident) Continue aspirin Xarelto currently on hold    Peripheral vascular disease  Continue aspirin, statin    Depression Continue Cymbalta, gabapentin   Tobacco use Ongoing use of close to 1 pack/day Nicotine patch offered   Malnutrition Nutrition Problem: Moderate Malnutrition Etiology: acute illness Signs/Symptoms: energy intake < 75% for > 7 days, mild muscle depletion, mild fat depletion Interventions: MVI         Consultants: Surgery Cardiology Neurology PCCM Nutrition   Procedures: Appendectomy 11/20   Antibiotics: Zosyn 11/19-     30 Day Unplanned Readmission Risk Score    Flowsheet Row ED to Hosp-Admission (Current) from 04/30/2023 [facility]  SURGICAL  30 Day Unplanned Readmission Risk Score (%) 19.11 Filed at 05/05/2023 0801       This score is the patient's risk of an unplanned readmission within 30 days of being discharged (0 -100%). The score is based on dignosis, age, lab data, medications, orders, and past utilization.   Low:  0-14.9   Medium: 15-21.9   High: 22-29.9   Extreme: 30 and above           Subjective: He was awake and alert this AM, understood the need for repeat procedure and planned for IR intervention.  Post-IR, I was called with RRT.  Patient tolerated the procedure well but crumped and became unresponsive post-procedure.  He is currently on NRB, RRT called, getting ABG.  He will transfer to ICU at this time.  Likely septic.   Objective: Vitals:   05/05/23 1315 05/05/23 1320  BP: (!) 144/98 (!) 170/100  Pulse: (!) 120 (!) 122  Resp: 17 20  Temp:    SpO2: 100% 100%    Intake/Output Summary (Last 24 hours) at 05/05/2023 1402 Last data filed at 05/05/2023 1110 Gross per 24 hour  Intake 425.57 ml  Output 1895 ml  Net -1469.43 ml   Filed Weights   04/30/23 2314 05/01/23 1334  Weight: 99.4 kg 99.3 kg    Exam:  General:  Appears ill Eyes:  EOMI, normal lids, iris ENT:  grossly normal hearing, lips & tongue, mmm Neck:  no LAD, masses or thyromegaly Cardiovascular:  RRR, no m/r/g. No LE edema.   Respiratory:   CTA bilaterally with no wheezes/rales/rhonchi.  Normal respiratory effort. Abdomen:  taut, tympanic, distended, TTP, ongoing serosanguinous output from drain Skin:  no rash or induration seen on limited exam Musculoskeletal:  grossly normal tone BUE/BLE, good ROM, no bony abnormality Psychiatric:  generally oriented (x 2) and able to report plan for procedure and why it was needed pre-procedure; currently slower to respond with sparse conversation but generally appropriate albeit limited speech  Data Reviewed: I have reviewed the patient's lab results since admission.  Pertinent labs for today include:  WBC 15.9 - worsening Hgb 8.3 - stable Platelets 440    Family Communication: None present; PCCM will notify patient of change in condition as they assume care  Disposition: Status is: Inpatient Remains inpatient appropriate because: ongoing management    Total critical care time: 60 minutes Critical care time was exclusive of separately billable procedures and treating other patients. Critical care was necessary to treat or prevent imminent or life-threatening deterioration. Critical care was time spent personally by me on the following activities: development of treatment plan with patient and/or surrogate as well as nursing, discussions with consultants, evaluation of patient's response to treatment, examination of patient, obtaining history from patient or surrogate, ordering and  performing treatments and interventions, ordering and review of laboratory studies, ordering and review of radiographic studies, pulse oximetry and re-evaluation of patient's condition.   Unresulted Labs (From admission, onward)     Start     Ordered   05/06/23 0500  Heparin level (unfractionated)  Daily,   R      05/05/23 0140   05/05/23 1358  Blood gas, arterial  Once,   R        05/05/23 1357   05/03/23 0500  CBC  Daily,   R      05/02/23 1318   Unscheduled  Basic metabolic panel   Tomorrow morning,   R        05/05/23 1402             Author: Jonah Blue, MD 05/05/2023 2:02 PM  For on call review [email or web address].

## 2023-05-05 NOTE — Consult Note (Addendum)
NAME:  [name], MRN:  [id number], DOB:  02-16-1956, LOS: 5 ADMISSION DATE:  04/30/2023, CONSULTATION DATE:  05/05/2023 REFERRING MD:  Dr. Ophelia Charter - [facility], CHIEF COMPLAINT:  Shock    History of Present Illness:  Jared [name] is a 67 y.o. with an extensive past medical history significant for combined systolic and diastolic CHF, HTN, atrial fibrillation anticoagulated with Xarelto, prior CVA, chronic hypoxic respiratory failure requiring 2 L nasal cannula at baseline with, and type 2 diabetes who presented to the ED 11/19 for complaints of shortness of breath with fever 100.9.  CT abdomen and pelvis revealed perforated appendicitis.  Patient was admitted per hospitalist service with surgery consulted..  Patient underwent laparoscopic appendectomy 11/20 and has had a slow recovery since.  Mid afternoon 11/24 patient underwent IR consultation for percutaneous drain placement for abdominal abscess.  While in interventional radiology patient had acute decompensation with hypoxia, tachypnea, and altered mental status.  This prompted PCCM consulted and transferred to ICU.  Pertinent  Medical History  Combined systolic and diastolic CHF, HTN, atrial fibrillation anticoagulated with Xarelto, prior CVA, chronic hypoxic respiratory failure requiring 2 L nasal cannula at baseline with, and type 2 diabetes  Significant Hospital Events: Including procedures, antibiotic start and stop dates in addition to other pertinent events   11/19 admitted with perforated appendicitis 11/20 underwent laparoscopic appendectomy 11/24 acute decompensation during percutaneous drain to abdominal abscess, PCCM consulted  Interim History / Subjective:  Slight increased work of breathing, slightly altered  Objective   Blood pressure 135/80, pulse (!) 110, temperature 98 F (36.7 C), temperature source Oral, resp. rate 19, height 6\' 3"  (1.905 m), weight 99.3 kg, SpO2 100%.        Intake/Output Summary (Last 24 hours) at  05/05/2023 1422 Last data filed at 05/05/2023 1110 Gross per 24 hour  Intake 425.57 ml  Output 1695 ml  Net -1269.43 ml   Filed Weights   04/30/23 2314 05/01/23 1334  Weight: 99.4 kg 99.3 kg    Examination: General: Acute on chronic ill-appearing deconditioned obese male lying in bed in no acute distress HEENT: [place]/AT, MM pink/moist, PERRL,  Neuro: Alert, oriented to self, altered CV: s1s2 regular rate and rhythm, no murmur, rubs, or gallops,  PULM: Diminished air entry bilaterally, mild increased work of breathing, oxygen saturations of 100% on nonrebreather GI: soft, bowel sounds active in all 4 quadrants, non-tender, non-distended, percutaneous drain x 2 in place Extremities: warm/dry, nonpitting generalized edema  Skin: no rashes or lesions   Resolved Hospital Problem list     Assessment & Plan:  Sepsis due to perforated appendicitis with recurrent seizures response post percutaneous drain placement -Repeat CT abdomen and pelvis 11/24 consistent with deep pelvic abscess and ongoing peritonitis -S/p additional placement of percutaneous drain into pelvic abscess 11/24 P: Transfer to ICU for close observation Surgery following, appreciate assistance Supplemental oxygen as needed for sat goal greater than 92 Continue Zosyn Imaging per surgery NPO  Acute on chronic hypoxic and hypercapnic respiratory failure -Utilizes 2 L nasal cannula at baseline Tobacco abuse P: Continue supplemental oxygen  Nocturnal and as needed BiPAP Abrasion precautions Continue bronchodilators Mobilize as able  Combined systolic and diastolic congestive heart failure -Echocardiogram 11/21 with a EF of 55 to 60%, no WMA, grade 2 diastolic dysfunction Essential hypertension Hyperlipidemia Paroxysmal atrial fibrillation anticoagulated with Xarelto P: Continuous telemetry  Continue aspirin and statin Strict intake and output  Daily weight to assess volume status Daily assessment for need to  diurese  Closely monitor renal function and electrolytes   Acute kidney injury -Renal function has waxed and waned during admission,  Neurology consulted during admission  P: Follow renal function  Monitor urine output Trend Bmet Avoid nephrotoxins, Ensure adequate renal perfusion   History of prior CVA Syncope Tremors P: Supportive care  Iron deficiency anemia P: Trend CBC Transfuse per protocol  Moderate malnutrition P: Advance diet as able   Best Practice (right click and "Reselect all SmartList Selections" daily)   Diet/type: NPO DVT prophylaxis: SCDs Start: 04/30/23 1909  Pressure ulcer(s): not present on admission  GI prophylaxis: PPI Lines: N/A Foley:  N/A Code Status:  full code Last date of multidisciplinary goals of care discussion: pending   Labs   CBC: Recent Labs  Lab 05/01/23 0749 05/02/23 0755 05/03/23 0657 05/04/23 0550 05/05/23 0633  WBC 11.0* 10.2 12.9* 14.5* 15.9*  HGB 8.8* 9.6* 8.8* 8.2* 8.3*  HCT 30.1* 33.2* 30.5* 27.9* 28.5*  MCV 89.6 91.0 91.0 89.4 89.6  PLT 246 355 359 375 440*    Basic Metabolic Panel: Recent Labs  Lab 05/01/23 0749 05/02/23 0755 05/03/23 0657 05/03/23 1523 05/04/23 0550 05/05/23 0848  NA 135 135 131*  --  131* 134*  K 3.3* 4.3 4.2  --  3.9 3.8  CL 96* 96* 94*  --  92* 93*  CO2 29 30 31   --  29 27  GLUCOSE 98 193* 148*  --  150* 113*  BUN 8 14 23   --  19 12  CREATININE 0.97 1.61* 1.93*  --  1.44* 1.07  CALCIUM 8.2* 8.4* 8.1*  --  8.3* 8.6*  MG  --   --   --  2.2  --   --    GFR: Estimated Creatinine Clearance: 77.9 mL/min (by C-G formula based on SCr of 1.07 mg/dL). Recent Labs  Lab 04/30/23 1251 04/30/23 1333 05/01/23 0749 05/02/23 0755 05/02/23 1717 05/03/23 0657 05/04/23 0550 05/05/23 0633  WBC  --   --    < > 10.2  --  12.9* 14.5* 15.9*  LATICACIDVEN 2.9* 1.7  --   --  5.3* 1.1  --   --    < > = values in this interval not displayed.    Liver Function Tests: Recent Labs  Lab  04/30/23 1144 05/02/23 0755 05/03/23 1523 05/04/23 0550  AST 32 21 36  --   ALT 18 14 16   --   ALKPHOS 72 57 100  --   BILITOT 1.3* 0.4 0.5  --   PROT 7.3 6.0* 6.1*  --   ALBUMIN 3.7 2.7* 2.4* 2.2*   No results for input(s): "LIPASE", "AMYLASE" in the last 168 hours. Recent Labs  Lab 05/03/23 1339  AMMONIA 29    ABG    Component Value Date/Time   PHART 7.27 (L) 05/05/2023 1350   PCO2ART 79 (HH) 05/05/2023 1350   PO2ART 370 (H) 05/05/2023 1350   HCO3 36.3 (H) 05/05/2023 1350   TCO2 30 12/27/2022 0916   O2SAT 100 05/05/2023 1350     Coagulation Profile: Recent Labs  Lab 04/30/23 1144 05/05/23 0848  INR 1.7* 1.0    Cardiac Enzymes: No results for input(s): "CKTOTAL", "CKMB", "CKMBINDEX", "TROPONINI" in the last 168 hours.  HbA1C: HbA1c, POC (controlled diabetic range)  Date/Time Value Ref Range Status  10/25/2022 09:41 AM 5.0 0.0 - 7.0 % Final  12/18/2021 11:00 AM 7.0 0.0 - 7.0 % Final   Hgb A1c MFr Bld  Date/Time Value Ref Range Status  03/21/2022 05:50 PM 5.6 4.8 - 5.6 % Final    Comment:    (NOTE) Pre diabetes:          5.7%-6.4%  Diabetes:              >6.4%  Glycemic control for   <7.0% adults with diabetes     CBG: Recent Labs  Lab 05/04/23 1147 05/04/23 1704 05/04/23 2059 05/05/23 0820 05/05/23 1203  GLUCAP 138* 131* 134* 111* 117*    Review of Systems:   Please see the history of present illness. All other systems reviewed and are negative    Past Medical History:  He,  has a past medical history of AVM (arteriovenous malformation), CAD (coronary artery disease), CAP (community acquired pneumonia) (09/2013), Chronic combined systolic and diastolic CHF (congestive heart failure) (HCC), Chronic respiratory failure (HCC), Cluster headache, COPD (chronic obstructive pulmonary disease) (HCC), DM (diabetes mellitus) (HCC), History of CVA (cerebrovascular accident), Hypertension, IDA (iron deficiency anemia), Moderate tobacco use disorder,  NICM (nonischemic cardiomyopathy) (HCC), Nicotine addiction, Tobacco abuse, and Type 2 diabetes mellitus (HCC) (05/14/2016).   Surgical History:   Past Surgical History:  Procedure Laterality Date   ABDOMINAL AORTOGRAM W/LOWER EXTREMITY N/A 12/07/2022   Procedure: ABDOMINAL AORTOGRAM W/LOWER EXTREMITY;  Surgeon: Leonie Douglas, MD;  Location: [facility];  Service: Cardiovascular;  Laterality: N/A;   BIOPSY  11/12/2020   Procedure: BIOPSY;  Surgeon: Lemar Lofty., MD;  Location: [facility] ENDOSCOPY;  Service: Gastroenterology;;   CARDIAC CATHETERIZATION  10/2010   LM normal, LAD with 20% irregularities, LCX with 20%, RCA with 40% prox and 40% distal - EF of 35%   CATARACT EXTRACTION, BILATERAL     COLONOSCOPY W/ BIOPSIES AND POLYPECTOMY     COLONOSCOPY WITH PROPOFOL N/A 09/06/2018   Procedure: COLONOSCOPY WITH PROPOFOL;  Surgeon: Tressia Danas, MD;  Location: [facility] ENDOSCOPY;  Service: Gastroenterology;  Laterality: N/A;   ENTEROSCOPY N/A 09/28/2018   Procedure: ENTEROSCOPY;  Surgeon: Jeani Hawking, MD;  Location: [facility] ENDOSCOPY;  Service: Endoscopy;  Laterality: N/A;   ENTEROSCOPY N/A 10/28/2018   Procedure: ENTEROSCOPY;  Surgeon: Tressia Danas, MD;  Location: [facility] ENDOSCOPY;  Service: Gastroenterology;  Laterality: N/A;   ENTEROSCOPY N/A 10/09/2020   Procedure: ENTEROSCOPY;  Surgeon: Hilarie Fredrickson, MD;  Location: [facility] ENDOSCOPY;  Service: Endoscopy;  Laterality: N/A;   ENTEROSCOPY N/A 03/22/2022   Procedure: ENTEROSCOPY;  Surgeon: Benancio Deeds, MD;  Location: [facility] ENDOSCOPY;  Service: Gastroenterology;  Laterality: N/A;   ESOPHAGOGASTRODUODENOSCOPY N/A 11/12/2020   Procedure: ESOPHAGOGASTRODUODENOSCOPY (EGD);  Surgeon: Lemar Lofty., MD;  Location: [facility] ENDOSCOPY;  Service: Gastroenterology;  Laterality: N/A;   ESOPHAGOGASTRODUODENOSCOPY (EGD) WITH PROPOFOL N/A 09/05/2018   Procedure: ESOPHAGOGASTRODUODENOSCOPY (EGD) WITH PROPOFOL;  Surgeon: Benancio Deeds, MD;   Location: [facility] ENDOSCOPY;  Service: Gastroenterology;  Laterality: N/A;   ESOPHAGOGASTRODUODENOSCOPY (EGD) WITH PROPOFOL N/A 11/19/2020   Procedure: ESOPHAGOGASTRODUODENOSCOPY (EGD) WITH PROPOFOL;  Surgeon: Beverley Fiedler, MD;  Location: [facility] ENDOSCOPY;  Service: Gastroenterology;  Laterality: N/A;   ESOPHAGOGASTRODUODENOSCOPY (EGD) WITH PROPOFOL N/A 12/27/2022   Procedure: ESOPHAGOGASTRODUODENOSCOPY (EGD) WITH PROPOFOL;  Surgeon: Napoleon Form, MD;  Location: [facility] ENDOSCOPY;  Service: Gastroenterology;  Laterality: N/A;   EXCISION MASS HEAD     GIVENS CAPSULE STUDY N/A 09/06/2018   Procedure: GIVENS CAPSULE STUDY;  Surgeon: Tressia Danas, MD;  Location: [facility] ENDOSCOPY;  Service: Gastroenterology;  Laterality: N/A;   GIVENS CAPSULE STUDY N/A 09/26/2018   Procedure: GIVENS CAPSULE STUDY;  Surgeon: Beverley Fiedler, MD;  Location:  [facility] ENDOSCOPY;  Service: Gastroenterology;  Laterality: N/A;   GIVENS CAPSULE STUDY N/A 06/14/2019   Procedure: GIVENS CAPSULE STUDY;  Surgeon: Napoleon Form, MD;  Location: [facility] ENDOSCOPY;  Service: Endoscopy;  Laterality: N/A;   HEMOSTASIS CLIP PLACEMENT  11/12/2020   Procedure: HEMOSTASIS CLIP PLACEMENT;  Surgeon: Lemar Lofty., MD;  Location: [facility] ENDOSCOPY;  Service: Gastroenterology;;   HEMOSTASIS CONTROL  11/12/2020   Procedure: HEMOSTASIS CONTROL;  Surgeon: Lemar Lofty., MD;  Location: [facility] ENDOSCOPY;  Service: Gastroenterology;;   HOT HEMOSTASIS N/A 10/28/2018   Procedure: HOT HEMOSTASIS (ARGON PLASMA COAGULATION/BICAP);  Surgeon: Tressia Danas, MD;  Location: [facility] ENDOSCOPY;  Service: Gastroenterology;  Laterality: N/A;   HOT HEMOSTASIS N/A 11/12/2020   Procedure: HOT HEMOSTASIS (ARGON PLASMA COAGULATION/BICAP);  Surgeon: Lemar Lofty., MD;  Location: [facility] ENDOSCOPY;  Service: Gastroenterology;  Laterality: N/A;   HOT HEMOSTASIS N/A 03/22/2022   Procedure: HOT HEMOSTASIS (ARGON PLASMA COAGULATION/BICAP);  Surgeon: Benancio Deeds, MD;   Location: [facility] ENDOSCOPY;  Service: Gastroenterology;  Laterality: N/A;   INCISION AND DRAINAGE PERIRECTAL ABSCESS N/A 06/05/2017   Procedure: IRRIGATION AND DEBRIDEMENT PERIRECTAL ABSCESS;  Surgeon: Andria Meuse, MD;  Location: [facility] OR;  Service: General;  Laterality: N/A;   LAPAROSCOPIC APPENDECTOMY N/A 05/01/2023   Procedure: APPENDECTOMY LAPAROSCOPIC;  Surgeon: Emelia Loron, MD;  Location: [facility] OR;  Service: General;  Laterality: N/A;   PERIPHERAL VASCULAR INTERVENTION  12/07/2022   Procedure: PERIPHERAL VASCULAR INTERVENTION;  Surgeon: Leonie Douglas, MD;  Location: [facility];  Service: Cardiovascular;;   SUBMUCOSAL TATTOO INJECTION  11/12/2020   Procedure: SUBMUCOSAL TATTOO INJECTION;  Surgeon: Lemar Lofty., MD;  Location: [facility] ENDOSCOPY;  Service: Gastroenterology;;   VIDEO BRONCHOSCOPY Bilateral 05/08/2016   Procedure: VIDEO BRONCHOSCOPY WITH FLUORO;  Surgeon: Oretha Milch, MD;  Location: [facility] ENDOSCOPY;  Service: Cardiopulmonary;  Laterality: Bilateral;     Social History:   reports that he has been smoking cigarettes. He has a 23.5 pack-year smoking history. He has never used smokeless tobacco. He reports that he does not currently use alcohol. He reports that he does not use drugs.   Family History:  His family history includes Cancer in his father; Colon cancer in his mother; Diabetes in his brother, mother, and sister; Heart disease in his mother; Liver cancer in his mother.   Allergies Allergies  Allergen Reactions   Lisinopril Cough     Home Medications  Prior to Admission medications   Medication Sig Start Date End Date Taking? Authorizing Provider  acetaminophen (TYLENOL) 500 MG tablet Take 2 tablets (1,000 mg total) by mouth every 8 (eight) hours as needed for moderate pain. 01/03/23  Yes Newlin, Enobong, MD  albuterol (VENTOLIN HFA) 108 (90 Base) MCG/ACT inhaler INHALE 2 PUFFS INTO THE LUNGS EVERY 6 (SIX) HOURS AS NEEDED FOR WHEEZING OR SHORTNESS  OF BREATH. Patient taking differently: Inhale 2 puffs into the lungs daily as needed for wheezing or shortness of breath. 12/06/22  Yes [name], MD  aspirin EC 81 MG tablet Take 1 tablet (81 mg total) by mouth daily. Swallow whole. 12/07/22  Yes Leonie Douglas, MD  atorvastatin (LIPITOR) 80 MG tablet Take 1 tablet (80 mg total) by mouth daily. 02/04/23  Yes [name], MD  DULoxetine (CYMBALTA) 60 MG capsule Take 1 capsule (60 mg total) by mouth daily. For chronic leg pains 01/09/23  Yes [name], MD  ergocalciferol (DRISDOL) 1.25 MG (50000 UT) capsule Take 1 capsule (50,000 Units total) by mouth once  a week. 02/26/23  Yes [name], MD  ezetimibe (ZETIA) 10 MG tablet Take 1 tablet (10 mg total) by mouth daily. 01/31/23  Yes Pricilla Riffle, MD  ferrous sulfate (FEROSUL) 325 (65 FE) MG tablet Take 1 tablet (325 mg total) by mouth daily with breakfast. 01/30/23  Yes Danis, Andreas Blower, MD  folic acid (FOLVITE) 1 MG tablet Take 1 tablet (1 mg total) by mouth daily. 01/30/23  Yes [name], MD  furosemide (LASIX) 40 MG tablet Take 1-2 tablets (40-80 mg total) by mouth 2 (two) times daily as needed.Take an extra pill with worsening pedal edema. 01/09/23  Yes [name], MD  gabapentin (NEURONTIN) 300 MG capsule Take 1 capsule (300 mg total) by mouth 2 (two) times daily. 02/25/23  Yes Kathryne Hitch, MD  hydrALAZINE (APRESOLINE) 50 MG tablet Take 1 tablet (50 mg total) by mouth every 8 (eight) hours. 02/04/23  Yes [name], MD  isosorbide mononitrate (IMDUR) 30 MG 24 hr tablet Take 1 tablet (30 mg total) by mouth daily. 01/30/23  Yes [name], MD  metoprolol succinate (TOPROL-XL) 50 MG 24 hr tablet Take 1 tablet (50 mg total) by mouth daily. Take with or immediately following a meal. 02/04/23  Yes Newlin, Enobong, MD  nitroGLYCERIN (NITROSTAT) 0.4 MG SL tablet Place 1 tablet (0.4 mg total) under the tongue every 5 (five) minutes as needed for chest pain.  11/19/20 04/30/23 Yes Albertine Grates, MD  OXYGEN Inhale 2 L/min into the lungs continuous.   Yes [provider]  pantoprazole (PROTONIX) 40 MG tablet Take 1 tablet (40 mg total) by mouth daily. 02/04/23  Yes [name], MD  rivaroxaban (XARELTO) 20 MG TABS tablet Take 1 tablet (20 mg total) by mouth daily with supper. 10/25/22  Yes [name], MD  Accu-Chek Softclix Lancets lancets Use to check blood sugar once daily. 01/09/23   [name], MD  Blood Glucose Monitoring Suppl (ACCU-CHEK GUIDE) w/Device KIT Use to check blood sugar once daily. 01/30/23   [name], MD  Budeson-Glycopyrrol-Formoterol (BREZTRI AEROSPHERE) 160-9-4.8 MCG/ACT AERO Take 2 puffs first thing in morning and then another 2 puffs about 12 hours later. 02/25/23   Nyoka Cowden, MD  glucose blood (ACCU-CHEK GUIDE) test strip Use to check blood sugar once daily. 01/09/23   [name], MD  TUDORZA PRESSAIR 400 MCG/ACT AEPB INHALE 1 PUFF INTO THE LUNGS IN THE MORNING AND AT BEDTIME. 03/29/20 04/04/20  Nyoka Cowden, MD     Critical care time:   CRITICAL CARE Performed by: Shakeitha Umbaugh D. Harris   Total critical care time: 42 minutes  Critical care time was exclusive of separately billable procedures and treating other patients.  Critical care was necessary to treat or prevent imminent or life-threatening deterioration.  Critical care was time spent personally by me on the following activities: development of treatment plan with patient and/or surrogate as well as nursing, discussions with consultants, evaluation of patient's response to treatment, examination of patient, obtaining history from patient or surrogate, ordering and performing treatments and interventions, ordering and review of laboratory studies, ordering and review of radiographic studies, pulse oximetry and re-evaluation of patient's condition.  Shamyah Stantz D. Harris, NP-C [facility] Personal contact information can be  found on Amion  If no contact or response made please call [phone] 05/05/2023, 2:50 PM

## 2023-05-05 NOTE — Sedation Documentation (Signed)
Patient moved back to bed from CT table post CT drain placement. At this time patient was notably diaphoretic, lethargic and not responsive. Noted increased work of breathing, using all accessory muscles, audible stridor, central cyanosis. Patient was on 4 liters [place] and sats mid 90s. Placed on 15 liters NRB at this time. Rapid Response called at this time.

## 2023-05-06 ENCOUNTER — Ambulatory Visit (INDEPENDENT_AMBULATORY_CARE_PROVIDER_SITE_OTHER): Payer: Medicare HMO | Admitting: Podiatry

## 2023-05-06 DIAGNOSIS — I4891 Unspecified atrial fibrillation: Secondary | ICD-10-CM | POA: Diagnosis not present

## 2023-05-06 DIAGNOSIS — R55 Syncope and collapse: Secondary | ICD-10-CM | POA: Diagnosis not present

## 2023-05-06 DIAGNOSIS — I5020 Unspecified systolic (congestive) heart failure: Secondary | ICD-10-CM

## 2023-05-06 DIAGNOSIS — K35201 Acute appendicitis with generalized peritonitis, with perforation, without abscess: Secondary | ICD-10-CM | POA: Diagnosis not present

## 2023-05-06 DIAGNOSIS — Z91199 Patient's noncompliance with other medical treatment and regimen due to unspecified reason: Secondary | ICD-10-CM

## 2023-05-06 DIAGNOSIS — K3532 Acute appendicitis with perforation and localized peritonitis, without abscess: Secondary | ICD-10-CM | POA: Diagnosis not present

## 2023-05-06 HISTORY — DX: Unspecified atrial fibrillation: I48.91

## 2023-05-06 LAB — BASIC METABOLIC PANEL
Anion gap: 10 (ref 5–15)
BUN: 11 mg/dL (ref 8–23)
CO2: 31 mmol/L (ref 22–32)
Calcium: 8.6 mg/dL — ABNORMAL LOW (ref 8.9–10.3)
Chloride: 94 mmol/L — ABNORMAL LOW (ref 98–111)
Creatinine, Ser: 1.01 mg/dL (ref 0.61–1.24)
GFR, Estimated: >60 mL/min (ref >60)
Glucose, Bld: 111 mg/dL — ABNORMAL HIGH (ref 70–99)
Potassium: 3.7 mmol/L (ref 3.5–5.1)
Sodium: 135 mmol/L (ref 135–145)

## 2023-05-06 LAB — GLUCOSE, CAPILLARY
Glucose-Capillary: 103 mg/dL — ABNORMAL HIGH (ref 70–99)
Glucose-Capillary: 118 mg/dL — ABNORMAL HIGH (ref 70–99)
Glucose-Capillary: 142 mg/dL — ABNORMAL HIGH (ref 70–99)
Glucose-Capillary: 131 mg/dL — ABNORMAL HIGH (ref 70–99)

## 2023-05-06 LAB — CBC
HCT: 27.6 % — ABNORMAL LOW (ref 39.0–52.0)
Hemoglobin: 8.1 g/dL — ABNORMAL LOW (ref 13.0–17.0)
MCH: 26.3 pg (ref 26.0–34.0)
MCHC: 29.3 g/dL — ABNORMAL LOW (ref 30.0–36.0)
MCV: 89.6 fL (ref 80.0–100.0)
Platelets: 484 10*3/uL — ABNORMAL HIGH (ref 150–400)
RBC: 3.08 MIL/uL — ABNORMAL LOW (ref 4.22–5.81)
RDW: 17.2 % — ABNORMAL HIGH (ref 11.5–15.5)
WBC: 15.2 10*3/uL — ABNORMAL HIGH (ref 4.0–10.5)
nRBC: 0.5 % — ABNORMAL HIGH (ref 0.0–0.2)

## 2023-05-06 LAB — HEPARIN LEVEL (UNFRACTIONATED): Heparin Unfractionated: 0.4 [IU]/mL (ref 0.30–0.70)

## 2023-05-06 MED ORDER — SODIUM CHLORIDE 0.9% FLUSH
5.0000 mL | Freq: Three times a day (TID) | INTRAVENOUS | Status: DC
  Administered 2023-05-06 – 2023-05-21 (×45): 5 mL

## 2023-05-06 MED ORDER — BOOST / RESOURCE BREEZE PO LIQD CUSTOM
1.0000 | Freq: Three times a day (TID) | ORAL | Status: DC
  Administered 2023-05-06 – 2023-05-15 (×10): 1 via ORAL

## 2023-05-06 MED ORDER — POTASSIUM CHLORIDE CRYS ER 20 MEQ PO TBCR
20.0000 meq | EXTENDED_RELEASE_TABLET | Freq: Once | ORAL | Status: AC
  Administered 2023-05-06: 20 meq via ORAL
  Filled 2023-05-06: qty 1

## 2023-05-06 MED ORDER — ENSURE ENLIVE PO LIQD
237.0000 mL | Freq: Two times a day (BID) | ORAL | Status: DC
  Administered 2023-05-06 – 2023-05-21 (×20): 237 mL via ORAL

## 2023-05-06 MED ORDER — POLYETHYLENE GLYCOL 3350 17 G PO PACK
17.0000 g | PACK | Freq: Every day | ORAL | Status: DC
  Administered 2023-05-06 – 2023-05-07 (×2): 17 g via ORAL
  Filled 2023-05-06 (×2): qty 1

## 2023-05-06 MED ORDER — HALOPERIDOL LACTATE 5 MG/ML IJ SOLN
2.0000 mg | Freq: Four times a day (QID) | INTRAMUSCULAR | Status: DC | PRN
Start: 2023-05-06 — End: 2023-05-21

## 2023-05-06 NOTE — Progress Notes (Signed)
Progress Note   Patient: Jared [name] ZOX:[id number] DOB: 1955-08-31 DOA: 04/30/2023     6 DOS: the patient was seen and examined on 05/06/2023   Brief hospital course: 67yo with h/o chronic combined CHF, HTN, CVA, afib on Xarelto, PVD, and COPD on 2L home O2 who presented on 11/19 with SOB and other complaints.  Fever 100.9, WBC 13.4, lactate 2.9, Creatinine 1.65 with AKI.  CT A?P with acute perforated appendicitis with microperforation.  Admitted for IV antibiotics, Xarelto washout.  Surgery is consulting.  Underwent lap appy on 11/20 but slow recovery.  Repeat CT with abscess and concern for peritonitis.  Assessment and Plan:  Sepsis due to acute perforated appendicitis with microperforation Surgery consulted and underwent lap appy on 11/20 - visible appendiceal perforation noted with abscess that was drained and fecaliths removed; surgical drain was left in place Started on IV Zosyn Blood cultures negative x 5 days from admission Abdomen remains sore and distended with drain in LLQ Worsening leukocytosis with high risk of complications, repeat CT performed with deep abscess appreciated,, concern for peritonitis on 11/24 Went to IR for transgluteal pelvic drain placement, 75cc aspirate, sent for culture on 11/24 Unresponsiveness post-procedure, slowly improving - transferred to ICU at this time for acute on chronic respiratory failure (see below) Wound culture pending Continued on Zosyn for now  Acute on chronic hypoxic respiratory failure Event post-procedure following prone positioning for drain placement Required NRB O2 Monitored overnight in ICU Weaned down to 2L [place] O2 (home O2) at this time Chronic respiratory failure is secondary to COPD on 2L PRN duoneb, Albuterol Continue Breztri  Delirium Patient has had periodic delirium during the hospitalization with concern for seizures at one point but negative LTM EEG Currently with reported AV hallucinations Suspect this is  multifactorial and related to ICU psychosis in conjunction with polypharmacy and infection Will add prn IV Haldol and delirium precautions Neurology previously consulted Recommend NOT increasing Oxy or gabapentin   Syncope and collapse Patient reports 2 years of intermittent generalized body shaking with syncope and loss of consciousness that are often positional when going from sitting to standing. Last echocardiogram was 12/2021 with EF of 45 to 50% and global hypokinesis, grade 1 diastolic dysfunction.  No significant valvular abnormality. Repeat echo shows preserved EF, grade 2 diastolic dysfunction with moderate reduction in RV function and RV dilatation and a vague density in the IVC, concerning for possible PE/IVC thrombus STAT VQ scan negative for PE  Abdominal US ordered to rule out IVC thrombus but was non-diagnostic due to body habitus and air in abdominal cavity DVT US negative Cardiology consulted Will continue to treat empirically with Heparin for now 21 beat run of NSVT on 11/23   IDA Patient with h/o iron deficiency anemia, possibly with superimposed ABLA from surgery Receives weekly IV iron infusions, given on 11/21  Hgb improved to 9.6 on 11/22 post-iron but is now back to 8,1, appears to be stable   Essential hypertension Continue hydralazine, Imdur, metoprolol Holding Lasix due to recurrent AKI - may be able to add back soon   HLD Continue Zetia, atorvastatin   Atrial flutter  Continue Toprol XL Holding Xarelto due to surgery Started Heparin infusion on 11/21 and this is ongoing given ?IVC thrombus   Chronic diastolic CHF Echo 11/21 with preserved EF and grade 2 diastolic dysfunction Concern for IVC thrombus   History of CVA (cerebrovascular accident) Continue aspirin Xarelto currently on hold    Peripheral vascular disease  Continue aspirin,  statin   Depression Continue Cymbalta, gabapentin   Tobacco use Ongoing use of close to 1 pack/day Nicotine  patch offered   Malnutrition Nutrition Problem: Moderate Malnutrition Etiology: acute illness Signs/Symptoms: energy intake < 75% for > 7 days, mild muscle depletion, mild fat depletion Interventions: MVI         Consultants: Surgery Cardiology Neurology PCCM IR Nutrition PT OT   Procedures: Appendectomy 11/20   Antibiotics: Zosyn 11/19-    30 Day Unplanned Readmission Risk Score    Flowsheet Row ED to Hosp-Admission (Current) from 04/30/2023 in [place] [facility]  30 Day Unplanned Readmission Risk Score (%) 18.15 Filed at 05/06/2023 0401       This score is the patient's risk of an unplanned readmission within 30 days of being discharged (0 -100%). The score is based on dignosis, age, lab data, medications, orders, and past utilization.   Low:  0-14.9   Medium: 15-21.9   High: 22-29.9   Extreme: 30 and above           Subjective: Unresponsive and acutely hypoxic after procedure yesterday.  These issues are improved but her remains very ill with slow recovery.  No speciifc concerns today although he was not very talkative.  I spoke with his sister and he is "regular"   Objective: Vitals:   05/06/23 1300 05/06/23 1302  BP: 132/78 132/78  Pulse: 95   Resp: 18   Temp:    SpO2: 94%     Intake/Output Summary (Last 24 hours) at 05/06/2023 1333 Last data filed at 05/06/2023 0800 Gross per 24 hour  Intake 981.37 ml  Output 415 ml  Net 566.37 ml   Filed Weights   04/30/23 2314 05/01/23 1334  Weight: 99.4 kg 99.3 kg    Exam:  General:  Appears ill, cognitive slowing, slow to improve Eyes:  EOMI, normal lids, iris ENT:  grossly normal hearing, lips & tongue, mmm Neck:  no LAD, masses or thyromegaly Cardiovascular:  RRR, no m/r/g. No LE edema.  Respiratory:   CTA bilaterally with no wheezes/rales/rhonchi.  Normal respiratory effort. Abdomen:  still distended but not as TTP today, ongoing serosanguinous output from drains Skin:  no rash  or induration seen on limited exam Musculoskeletal:  no bony abnormality Psychiatric:  blunted affect, cognitive slowing noted  Data Reviewed: I have reviewed the patient's lab results since admission.  Pertinent labs for today include:   Unremarkable BMP WBC 15.2 Hgb 8.1 Platelets 484 Gram stain with abundant WBC, GPC in pairs, moderate GNR, few GPR; culture pending     Family Communication: None present; I spoke with sister by telephone  Disposition: Status is: Inpatient Remains inpatient appropriate because: ongoing management     Time spent: 50 minutes  Unresulted Labs (From admission, onward)     Start     Ordered   05/07/23 0500  Basic metabolic panel  Tomorrow morning,   R       Question:  Specimen collection method  Answer:  Lab=Lab collect   05/06/23 1333   05/06/23 0500  Heparin level (unfractionated)  Daily,   R      05/05/23 0140   05/03/23 0500  CBC  Daily,   R      05/02/23 1318             Author: Jonah Blue, MD 05/06/2023 1:33 PM  For on call review [email or web address].

## 2023-05-06 NOTE — Progress Notes (Addendum)
PHARMACY - ANTICOAGULATION CONSULT NOTE  Pharmacy Consult for heparin  Indication:  IVC thrombus , suspected and history of Afib and CVA  Allergies  Allergen Reactions   Lisinopril Cough    Patient Measurements: Height: 6\' 3"  (190.5 cm) Weight: 99.3 kg (219 lb) IBW/kg (Calculated) : 84.5 Heparin Dosing Weight: 99.3 kg   Vital Signs: Temp: 98.2 F (36.8 C) (11/25 0712) Temp Source: Oral (11/25 0712) BP: 166/79 (11/25 0830) Pulse Rate: 101 (11/25 0830)  Labs: Recent Labs    05/04/23 0550 05/04/23 1533 05/05/23 0633 05/05/23 0848 05/05/23 2322 05/06/23 0221  HGB 8.2*  --  8.3*  --   --  8.1*  HCT 27.9*  --  28.5*  --   --  27.6*  PLT 375  --  440*  --   --  484*  LABPROT  --   --   --  13.7  --   --   INR  --   --   --  1.0  --   --   HEPARINUNFRC 0.33   < > 0.49  --  0.52 0.40  CREATININE 1.44*  --   --  1.07  --  1.01   < > = values in this interval not displayed.    Estimated Creatinine Clearance: 82.5 mL/min (by C-G formula based on SCr of 1.01 mg/dL).  Assessment: 67 yo M with suspected PE from ECHO findings. V/Q scan neg for PE. Vague density in the IVC. Cannot rule out thrombus. Pt is s/p appendectomy with drain placement on 11/20. Pt is s/p transgluteal pelvic drain placement in IR on 11/24.  Heparin level 0.4, therapeutic, on heparin 1900 units/hr Hgb 8.1, Plt 484 - stable  RN reports abdominal incision continuing to ooze, but serous, and not bloody. No issues with heparin gtt reported from nursing  Goal of Therapy:  Heparin level 0.3-0.7 units/ml Monitor platelets by anticoagulation protocol: Yes   Plan:  Continue heparin infusion to 1900 units/hr Monitor daily CBC, heparin level, and for s/sx of bleeding Follow- up on plan to transition to oral [facility]   Thank you for allowing pharmacy to be a part of this patient's care.  Wilburn Cornelia, PharmD, BCPS Clinical Pharmacist 05/06/2023 10:05 AM   Please refer to AMION for pharmacy phone number

## 2023-05-06 NOTE — Progress Notes (Addendum)
Patient Name: Jared [name] Date of Encounter: 05/06/2023 [facility] Cardiologist: Dietrich Pates, MD   Interval Summary  .    67 yr old male with PMH of mild CAD, HFrEF with improved LVEF, HTN, PAD, HLD, paroxysmal atrial flutter on Xarleto, CVA, oxygen dependent COPD, active tobacco use.   She was admitted 11/19 for sepsis due to acute perforated appendicitis with microperforation, s/p lap appy 11/20, course complicated by AKI, A fib RVR.   11/25  SOB, even on O2, wheezing, abd is distended and tight, no change  Vital Signs .    Vitals:   05/06/23 0930 05/06/23 1000 05/06/23 1030 05/06/23 1115  BP: (!) 144/70 (!) 140/99    Pulse: 99 96 94   Resp: 18 19 17    Temp:    98.7 F (37.1 C)  TempSrc:    Oral  SpO2: 91% 93% 93%   Weight:      Height:        Intake/Output Summary (Last 24 hours) at 05/06/2023 1156 Last data filed at 05/06/2023 0800 Gross per 24 hour  Intake 981.37 ml  Output 415 ml  Net 566.37 ml      05/01/2023    1:34 PM 04/30/2023   11:14 PM 04/24/2023   10:40 AM  Last 3 Weights  Weight (lbs) 219 lb 219 lb 2.2 oz 219 lb 12.8 oz  Weight (kg) 99.338 kg 99.4 kg 99.701 kg      Telemetry/ECG    SR, ST, HR generally high-nl, short bursts of tachycardia w/ no sx - Personally Reviewed  ECHO: 05/02/2023  1. Left ventricular ejection fraction, by estimation, is 55 to 60%. The left ventricle has normal function. The left ventricle has no regional wall motion abnormalities. Left ventricular diastolic parameters are consistent with Grade II diastolic dysfunction (pseudonormalization).   2. Right ventricular systolic function is moderately reduced. The right ventricular size is moderately enlarged. Tricuspid regurgitation signal is inadequate for assessing PA pressure.   3. Left atrial size was mildly dilated.   4. The mitral valve is degenerative. No evidence of mitral valve  regurgitation. No evidence of mitral stenosis.   5. The aortic valve is  tricuspid. Aortic valve regurgitation is not  visualized. Aortic valve sclerosis/calcification is present, without any evidence of aortic stenosis.   6. There is a vague density in the IVC. Cannot rule out thrombus.   Recommend dedicated Abdominal CTA vs. Abdominal US for further evaluation.  The inferior vena cava is dilated in size with >50% respiratory  variability, suggesting right atrial pressure of  8 mmHg.   7. In setting of syncope as well as RV dysfunction, consider acute PE.  Recommend Chest CTA if clinically indicated.    Physical Exam .    General: well-developed, male in respiratory distress Head: Eyes PERRLA, Head normocephalic and atraumatic Lungs: decreased BS bases, pt has trouble taking a deep breath, +wheeze Heart: HRRR, S1 S2, without rub or gallop. No murmur. 4/4 extremity pulses are 2+ & equal. No JVD. Abdomen: Bowel sounds are present, abdomen soft and non-tender without masses or  hernias noted. Msk: weak strength and tone for age. Extremities: No clubbing, cyanosis or edema.    Skin:  No rashes or lesions noted. Neuro: Alert and oriented X 3. Psych:  Good affect, responds appropriately  Assessment & Plan .     Paroxysmal A fib/flutter with RVR - In and out of AT versus A fib RVR, sinus tachycardia, episodes are short and self limiting -  PTA Xarelto held  Now on heparin  - continue PTA metoprolol XL 50mg  daily for rate control, feel elevated rate mainly due to acute illness.  - up-tritiate if needed, although would treat AKI/infection first   HFrEF with improved LVEF RV dysfunction new this admission  - Echo 11/21 showed LVEF improved to 55-60% , no RWMA, grade II DD, RV mod reduced, RV mod enlarged, mild LAE, There is a vague density in the IVC. Cannot rule out thrombus.  - VQ scan negative for PE  - he is euvolemic on exam at this time  - GDMT: continue Toprol XL, hold off additional agents given AKI and post-op   Perforated appendicitis  AKI Anemia   Syncope  Intermittent involuntary movement of arms and legs  COPD - requested RT give an extra treatment Hx of CVA - per primary team    For questions or updates, please contact [facility] Please consult [email or web address] for contact info under        Signed, Theodore Demark, PA-C

## 2023-05-06 NOTE — TOC Initial Note (Signed)
Transition of Care [facility]) - Initial/Assessment Note    Patient Details  Name: Jared [name] MRN: [id number] Date of Birth: 1955-10-24  Transition of [facility]) CM/SW Contact:    Rayna Brenner A [place], Theresia Majors Phone Number: 05/06/2023, 3:23 PM  Clinical Narrative:                  CSW met with pt at bedside. He stated that he was agreeable to SNF. CSW followed up with pt's sister, Adela Lank to update on disposition plan and she stated she had been made aware. SNF work up completed. Bed offers pending.    TOC will continue to follow.   Expected Discharge Plan: Skilled Nursing Facility Barriers to Discharge: Continued Medical Work up, SNF Pending bed offer   Patient Goals and CMS Choice Patient states their goals for this hospitalization and ongoing recovery are:: to return to home          Expected Discharge Plan and Services   Discharge Planning Services: CM Consult Post Acute Care Choice: NA Living arrangements for the past 2 months: Single Family Home                 DME Arranged: N/A         HH Arranged: NA          Prior Living Arrangements/Services Living arrangements for the past 2 months: Single Family Home Lives with:: Friends Patient language and need for interpreter reviewed:: Yes Do you feel safe going back to the place where you live?: Yes      Need for Family Participation in Patient Care: Yes (Comment) Care giver support system in place?: Yes (comment) (pt's sister, Jaqueline) Current home services: DME Criminal Activity/Legal Involvement Pertinent to Current Situation/Hospitalization: No - Comment as needed  Activities of Daily Living   ADL Screening (condition at time of admission) Independently performs ADLs?: Yes (appropriate for developmental age) Is the patient deaf or have difficulty hearing?: No Does the patient have difficulty seeing, even when wearing glasses/contacts?: No Does the patient have difficulty concentrating, remembering, or making  decisions?: No  Permission Sought/Granted   Permission granted to share information with : No              Emotional Assessment Appearance:: Appears stated age Attitude/Demeanor/Rapport: Guarded Affect (typically observed): Quiet Orientation: : Oriented to Self, Oriented to Place, Oriented to Situation Alcohol / Substance Use: Illicit Drugs Psych Involvement: No (comment)  Admission diagnosis:  Acute perforated appendicitis [K35.32] Syncope, unspecified syncope type [R55] Acute appendicitis with perforation and localized peritonitis, without abscess or gangrene [K35.32] Sepsis with acute renal failure without septic shock, due to unspecified organism, unspecified acute renal failure type (HCC) [A41.9, R65.20, N17.9] Patient Active Problem List   Diagnosis Date Noted   Atrial fibrillation with RVR (HCC) 05/06/2023   HFrEF (heart failure with reduced ejection fraction) (HCC) 05/06/2023   Abnormal echocardiogram 05/02/2023   Malnutrition of moderate degree 05/01/2023   Sepsis (HCC) 04/30/2023   Acute perforated appendicitis 04/30/2023   History of CVA (cerebrovascular accident) 04/30/2023   PAF (paroxysmal atrial fibrillation) (HCC) 12/27/2022   Atherosclerosis of aorta (HCC) 10/25/2022   Peripheral vascular disease (HCC) 10/25/2022   Weakness 03/21/2022   Syncope and collapse 02/07/2021   History of GI bleed    Gastric ulcer due to Helicobacter pylori and nonsteroidal anti-inflammatory drug (NSAID)    AKI (acute kidney injury) (HCC) 11/18/2020   Epigastric pain    Syncope 06/11/2019   Chronic right maxillary sinusitis 01/29/2019  Chronic respiratory failure with hypoxia and hypercapnia (HCC) 01/01/2019   Upper GI bleed 10/27/2018   Anticoagulated 09/27/2018   AVM (arteriovenous malformation) of small bowel, acquired    Iron deficiency anemia due to chronic blood loss    Symptomatic anemia 09/04/2018   Tobacco use 09/04/2018   Noncompliance 06/14/2017   Elevated LFTs  08/13/2016   Chronic respiratory failure with hypoxia (HCC) 07/24/2016   Atrial flutter (HCC)    Hepatic congestion 07/02/2016   COPD with acute exacerbation (HCC) 06/03/2016   Type 2 diabetes mellitus (HCC) 05/14/2016   COPD GOLD III/still smoking  02/29/2016   Cigarette smoker 01/09/2016   CAD (coronary artery disease) 10/07/2013   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, [address] [place] [address] Phone: [phone] Fax: [phone]  [facility] - [place], [place] - [address] [place] [address] Phone: [phone] Fax: [phone]  [facility] [address] [place] [address] Phone: [phone] Fax: [phone]  [id number] [place], [place] - [address] AT [facility] [address] [place] [address] Phone: [phone] Fax: [phone]     Social Determinants of Health (SDOH) Social History: SDOH Screenings   Food Insecurity: No Food Insecurity (04/30/2023)  Housing: Low Risk  (04/30/2023)  Transportation Needs: No Transportation Needs (04/30/2023)  Utilities: Not At Risk (04/30/2023)  Alcohol Screen: Low Risk  (02/06/2023)  Depression (PHQ2-9): Low Risk  (02/04/2023)  Recent Concern: Depression (PHQ2-9) - High Risk (01/09/2023)  Financial Resource Strain: Medium Risk (02/06/2023)  Physical Activity: Insufficiently Active (02/06/2023)  Social Connections: Moderately Integrated (02/06/2023)  Stress: No Stress Concern Present (02/06/2023)  Tobacco Use: High Risk (05/05/2023)  Health Literacy: Adequate Health Literacy (02/06/2023)   SDOH Interventions:     Readmission Risk Interventions    12/27/2022    2:20 PM 12/14/2021     1:38 PM 02/01/2021    3:22 PM  Readmission Risk Prevention Plan  Post Dischage Appt Complete Complete   Medication Screening Complete Complete   Transportation Screening Complete Complete Complete  PCP or Specialist Appt within 5-7 Days   Complete  Home Care Screening   Complete  Medication Review (RN CM)   Complete

## 2023-05-06 NOTE — [place] FL2 (Signed)
[place] MEDICAID FL2 LEVEL OF CARE FORM     IDENTIFICATION  Patient Name: Jared [name] Birthdate: [birth year] Sex: male Admission Date (Current Location): 04/30/2023  [facility] and [place] Number:  Producer, television/film/video and Address:  [facility], [address] [place], [place] [address]      Provider Number: [id number]  Attending Physician Name and Address:  Jonah Blue, MD  Relative Name and Phone Number:  Tinnie Gens (Sister)  [phone]    Current Level of Care: Hospital Recommended Level of Care: Skilled Nursing Facility Prior Approval Number:    Date Approved/Denied:   PASRR Number: [id number]  Discharge Plan: SNF    Current Diagnoses: Patient Active Problem List   Diagnosis Date Noted   Atrial fibrillation with RVR (HCC) 05/06/2023   HFrEF (heart failure with reduced ejection fraction) (HCC) 05/06/2023   Abnormal echocardiogram 05/02/2023   Malnutrition of moderate degree 05/01/2023   Sepsis (HCC) 04/30/2023   Acute perforated appendicitis 04/30/2023   History of CVA (cerebrovascular accident) 04/30/2023   PAF (paroxysmal atrial fibrillation) (HCC) 12/27/2022   Atherosclerosis of aorta (HCC) 10/25/2022   Peripheral vascular disease (HCC) 10/25/2022   Weakness 03/21/2022   Syncope and collapse 02/07/2021   History of GI bleed    Gastric ulcer due to Helicobacter pylori and nonsteroidal anti-inflammatory drug (NSAID)    AKI (acute kidney injury) (HCC) 11/18/2020   Epigastric pain    Syncope 06/11/2019   Chronic right maxillary sinusitis 01/29/2019   Chronic respiratory failure with hypoxia and hypercapnia (HCC) 01/01/2019   Upper GI bleed 10/27/2018   Anticoagulated 09/27/2018   AVM (arteriovenous malformation) of small bowel, acquired    Iron deficiency anemia due to chronic blood loss    Symptomatic anemia 09/04/2018   Tobacco use 09/04/2018   Noncompliance 06/14/2017   Elevated LFTs 08/13/2016   Chronic  respiratory failure with hypoxia (HCC) 07/24/2016   Atrial flutter (HCC)    Hepatic congestion 07/02/2016   COPD with acute exacerbation (HCC) 06/03/2016   Type 2 diabetes mellitus (HCC) 05/14/2016   COPD GOLD III/still smoking  02/29/2016   Cigarette smoker 01/09/2016   CAD (coronary artery disease) 10/07/2013   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 Closed System Drain 1 Right;Posterior;Lateral Buttock Bulb (JP) 8 Fr.)                       Personal Care Assistance Level of Assistance  Bathing, Feeding, Dressing Bathing Assistance: Limited assistance Feeding assistance: Independent Dressing Assistance: Limited assistance     Functional Limitations Info  Sight, Hearing, Speech Sight Info: Impaired (wears reading glasses) Hearing Info: Adequate      SPECIAL CARE FACTORS FREQUENCY  PT (By licensed PT), OT (By licensed OT)     PT Frequency: 5x/week OT Frequency: 5x/week            Contractures Contractures Info: Not present    Additional Factors Info  Code Status, Allergies Code Status Info:  FULL Allergies Info: Lisinopril           Current Medications (05/06/2023):  This is the current hospital active medication list Current Facility-Administered Medications  Medication Dose Route Frequency Provider Last Rate Last Admin   acetaminophen (TYLENOL) tablet 1,000 mg  1,000 mg Oral Q6H Eric Form, PA-C   1,000 mg at 05/06/23 1302   albuterol (PROVENTIL) (2.5 MG/3ML) 0.083% nebulizer solution 2.5 mg  2.5 mg Nebulization Q2H PRN Jonah Blue, MD   2.5 mg at 05/06/23 1212   arformoterol (BROVANA) nebulizer solution 15 mcg  15 mcg Nebulization BID Simonne Martinet, NP   15 mcg at 05/06/23 9147   aspirin EC tablet 81 mg  81 mg Oral Daily Eric Form, PA-C   81 mg at 05/06/23 1041   atorvastatin (LIPITOR) tablet 80 mg  80 mg Oral Daily Eric Form, PA-C   80 mg at 05/06/23 1041   bisacodyl (DULCOLAX) suppository 10 mg  10 mg Rectal Daily PRN Anthoney Harada, NP       budesonide (PULMICORT) nebulizer solution 0.5 mg  0.5 mg Nebulization BID Simonne Martinet, NP   0.5 mg at 05/06/23 8295   Chlorhexidine Gluconate Cloth 2 % PADS 6 each  6 each Topical Daily Hunsucker, Lesia Sago, MD   6 each at 05/06/23 1046   DULoxetine (CYMBALTA) DR capsule 60 mg  60 mg Oral Daily Eric Form, PA-C   60 mg at 05/06/23 1041   ezetimibe (ZETIA) tablet 10 mg  10 mg Oral Daily Eric Form, PA-C   10 mg at 05/06/23 1041   feeding supplement (BOOST / RESOURCE BREEZE) liquid 1 Container  1 Container Oral TID WC Jonah Blue, MD   1 Container at 05/06/23 1302   feeding supplement (ENSURE ENLIVE / ENSURE PLUS) liquid 237 mL  237 mL Oral BID BM Jonah Blue, MD   237 mL at 05/06/23 1303   ferrous sulfate tablet 325 mg  325 mg Oral Q breakfast Eric Form, PA-C   325 mg at 05/06/23 6213   folic acid (FOLVITE) tablet 1 mg  1 mg Oral Daily Eric Form, PA-C   1 mg at 05/06/23 1041   gabapentin (NEURONTIN) capsule 300 mg  300 mg Oral BID Eric Form, PA-C   300 mg at 05/06/23 1041   haloperidol lactate (HALDOL) injection 2-5 mg  2-5 mg Intravenous Q6H PRN Jonah Blue, MD       heparin ADULT infusion 100 units/mL (25000 units/221mL)  1,900 Units/hr Intravenous Continuous Reome, Earle J, RPH 19 mL/hr at 05/06/23 1600 1,900 Units/hr at 05/06/23 1600   hydrALAZINE  (APRESOLINE) tablet 50 mg  50 mg Oral Q8H Eric Form, PA-C   50 mg at 05/06/23 1302   isosorbide mononitrate (IMDUR) 24 hr tablet 30 mg  30 mg Oral Daily Eric Form, PA-C   30 mg at 05/06/23 1041   metoprolol succinate (TOPROL-XL) 24 hr tablet 50 mg  50 mg Oral Daily Eric Form, PA-C   50 mg at 05/06/23 1041   morphine (PF) 2 MG/ML injection 1-2 mg  1-2 mg Intravenous Q3H PRN Eric Form, PA-C   2 mg at 05/06/23 0865   multivitamin with minerals tablet 1 tablet  1 tablet Oral Daily Eric Form, PA-C   1 tablet at 05/06/23 1041   nicotine (NICODERM CQ - dosed in mg/24 hours) patch 21 mg  21 mg Transdermal Daily Eric Form, PA-C  21 mg at 05/06/23 1046   oxyCODONE (Oxy IR/ROXICODONE) immediate release tablet 5-10 mg  5-10 mg Oral Q4H PRN Eric Form, PA-C   10 mg at 05/04/23 0906   pantoprazole (PROTONIX) EC tablet 40 mg  40 mg Oral Daily Eric Form, PA-C   40 mg at 05/06/23 1041   polyethylene glycol (MIRALAX / GLYCOLAX) packet 17 g  17 g Oral Daily Axel Filler, MD   17 g at 05/06/23 1041   potassium chloride SA (KLOR-CON M) CR tablet 20 mEq  20 mEq Oral Once Quintella Reichert, MD       revefenacin (YUPELRI) nebulizer solution 175 mcg  175 mcg Nebulization Daily Simonne Martinet, NP   175 mcg at 05/06/23 0839   sodium chloride flush (NS) 0.9 % injection 5 mL  5 mL Intracatheter Q8H Suttle, Thressa Sheller, MD   5 mL at 05/06/23 1303     Discharge Medications: Please see discharge summary for a list of discharge medications.  Relevant Imaging Results:  Relevant Lab Results:   Additional Information SSN: [id number]  Cadyn Fann A [place], [place]

## 2023-05-06 NOTE — Progress Notes (Signed)
Pt does not use CPAP@home . Recommended sleep study when discharged. Pt unlabored on 4L via [place]. Unit available if needed.

## 2023-05-06 NOTE — Progress Notes (Signed)
Nutrition Follow-up  DOCUMENTATION CODES:   Non-severe (moderate) malnutrition in context of acute illness/injury  INTERVENTION:   - If pt unable to tolerate GI Soft diet due to nausea, recommend downgrading to clear liquid or full liquid diet  - Boost Breeze po TID with meals, each supplement provides 250 kcal and 9 grams of protein  - Ensure Enlive po BID between meals, each supplement provides 350 kcal and 20 grams of protein  - Continue MVI with minerals daily  NUTRITION DIAGNOSIS:   Moderate Malnutrition related to acute illness as evidenced by energy intake < 75% for > 7 days, mild muscle depletion, mild fat depletion.  Ongoing, being addressed via diet advancement and oral nutrition supplements  GOAL:   Patient will meet greater than or equal to 90% of their needs  Progressing  MONITOR:   PO intake, Supplement acceptance, Labs, Weight trends, I & O's  REASON FOR ASSESSMENT:   Malnutrition Screening Tool    ASSESSMENT:   67 y.o. male with PMH of CHF, T2DM, HTN, CVA, atrial flutter, CAD, AVM, PVD and COPD with chronic hypoxic respiratory failure on 2L who presents with numerous complaints including headache, abdominal, testicular pain. Found to have acute perforated appendicitis.  11/19 - CT showed acute perforated appendicitis with microperforation  11/20 - s/p laparoscopic appendectomy, clear liquids 11/21 - NPO, later advanced to clear liquids 11/22 - Nephrology consulted for AKI 11/24 - NPO, s/p CT guided transgluteal pelvis drain placement by IR, transferred to ICU 11/25 - GI soft diet  Discussed pt with RN and during ICU rounds. Per RN, pt passing flatus. Spoke with pt at bedside. Abdomen distended and taut at time of RD visit. RN has documented audible bowel sounds. Pt endorses pain upon palpation of abdomen. Pt reports some feelings of intermittent nausea but states that he is not feeling nauseous at the moment. Pt states that he thinks that he could drink  something but isn't sure about eating. Pt willing to try oral nutrition supplements. Pt seemed somewhat confused during RD interview.  Pt currently with Boost Breeze ordered TID. Will also add Ensure Enlive between meals to provide additional kcal and protein. If pt unable to tolerate GI Soft diet due to nausea, recommend downgraded to clear liquid or full liquid diet.  Medications reviewed and include: Boost Breeze TID, ferrous sulfate, folic acid, MVI with minerals, protonix, miralax, heparin gtt  Labs reviewed: WBC 15.2, hemoglobin 8.1 CBG's: 103-117 x 24 hours  UOP: 950 ml x 24 hours 8 Fr R buttock JP drain: 40 ml x 24 hours 16 Fr abd drain: 25 ml x 24 hours  Diet Order:   Diet Order             DIET SOFT Fluid consistency: Thin  Diet effective now                   EDUCATION NEEDS:   Education needs have been addressed  Skin:  Skin Assessment: Skin Integrity Issues: Incisions: closed abd Other: puncture wound R buttocks for drain placement  Last BM:  05/02/23  Height:   Ht Readings from Last 1 Encounters:  05/01/23 6\' 3"  (1.905 m)    Weight:   Wt Readings from Last 1 Encounters:  05/01/23 99.3 kg    Ideal Body Weight:  89.1 kg  BMI:  Body mass index is 27.37 kg/m.  Estimated Nutritional Needs:   Kcal:  2400-2600 kcal  Protein:  130-150 gm  Fluid:  >2.4L  Mertie Clause, MS, RD, LDN Registered Dietitian II Please see AMiON for contact information.

## 2023-05-06 NOTE — Progress Notes (Addendum)
Referring Physician(s): Dr Derrell Lolling  Supervising Physician: Roanna Banning  Patient Status:  [facility] - In-pt  Chief Complaint:  Trans gluteal abscess  Appendectomy with perforated appendix  Subjective:  IR procedure 11/24: CT guided transgluteal pelvic drain placement  Up in bed Feeling some better Has existing surgical drain in place also  Allergies: Lisinopril  Medications: Prior to Admission medications   Medication Sig Start Date End Date Taking? Authorizing Provider  acetaminophen (TYLENOL) 500 MG tablet Take 2 tablets (1,000 mg total) by mouth every 8 (eight) hours as needed for moderate pain. 01/03/23  Yes Newlin, Enobong, MD  albuterol (VENTOLIN HFA) 108 (90 Base) MCG/ACT inhaler INHALE 2 PUFFS INTO THE LUNGS EVERY 6 (SIX) HOURS AS NEEDED FOR WHEEZING OR SHORTNESS OF BREATH. Patient taking differently: Inhale 2 puffs into the lungs daily as needed for wheezing or shortness of breath. 12/06/22  Yes [name], MD  aspirin EC 81 MG tablet Take 1 tablet (81 mg total) by mouth daily. Swallow whole. 12/07/22  Yes Leonie Douglas, MD  atorvastatin (LIPITOR) 80 MG tablet Take 1 tablet (80 mg total) by mouth daily. 02/04/23  Yes [name], MD  DULoxetine (CYMBALTA) 60 MG capsule Take 1 capsule (60 mg total) by mouth daily. For chronic leg pains 01/09/23  Yes [name], MD  ergocalciferol (DRISDOL) 1.25 MG (50000 UT) capsule Take 1 capsule (50,000 Units total) by mouth once a week. 02/26/23  Yes [name], MD  ezetimibe (ZETIA) 10 MG tablet Take 1 tablet (10 mg total) by mouth daily. 01/31/23  Yes Pricilla Riffle, MD  ferrous sulfate (FEROSUL) 325 (65 FE) MG tablet Take 1 tablet (325 mg total) by mouth daily with breakfast. 01/30/23  Yes Danis, Andreas Blower, MD  folic acid (FOLVITE) 1 MG tablet Take 1 tablet (1 mg total) by mouth daily. 01/30/23  Yes [name], MD  furosemide (LASIX) 40 MG tablet Take 1-2 tablets (40-80 mg total) by mouth 2 (two) times daily as  needed.Take an extra pill with worsening pedal edema. 01/09/23  Yes [name], MD  gabapentin (NEURONTIN) 300 MG capsule Take 1 capsule (300 mg total) by mouth 2 (two) times daily. 02/25/23  Yes Kathryne Hitch, MD  hydrALAZINE (APRESOLINE) 50 MG tablet Take 1 tablet (50 mg total) by mouth every 8 (eight) hours. 02/04/23  Yes [name], MD  isosorbide mononitrate (IMDUR) 30 MG 24 hr tablet Take 1 tablet (30 mg total) by mouth daily. 01/30/23  Yes [name], MD  metoprolol succinate (TOPROL-XL) 50 MG 24 hr tablet Take 1 tablet (50 mg total) by mouth daily. Take with or immediately following a meal. 02/04/23  Yes Newlin, Enobong, MD  nitroGLYCERIN (NITROSTAT) 0.4 MG SL tablet Place 1 tablet (0.4 mg total) under the tongue every 5 (five) minutes as needed for chest pain. 11/19/20 04/30/23 Yes Albertine Grates, MD  OXYGEN Inhale 2 L/min into the lungs continuous.   Yes [provider]  pantoprazole (PROTONIX) 40 MG tablet Take 1 tablet (40 mg total) by mouth daily. 02/04/23  Yes [name], MD  rivaroxaban (XARELTO) 20 MG TABS tablet Take 1 tablet (20 mg total) by mouth daily with supper. 10/25/22  Yes [name], MD  Accu-Chek Softclix Lancets lancets Use to check blood sugar once daily. 01/09/23   [name], MD  Blood Glucose Monitoring Suppl (ACCU-CHEK GUIDE) w/Device KIT Use to check blood sugar once daily. 01/30/23   [name], MD  Budeson-Glycopyrrol-Formoterol (BREZTRI AEROSPHERE) 160-9-4.8 MCG/ACT AERO Take  2 puffs first thing in morning and then another 2 puffs about 12 hours later. 02/25/23   Nyoka Cowden, MD  glucose blood (ACCU-CHEK GUIDE) test strip Use to check blood sugar once daily. 01/09/23   [name], MD  TUDORZA PRESSAIR 400 MCG/ACT AEPB INHALE 1 PUFF INTO THE LUNGS IN THE MORNING AND AT BEDTIME. 03/29/20 04/04/20  Nyoka Cowden, MD     Vital Signs: BP (!) 166/79   Pulse (!) 101   Temp 98.2 F (36.8 C) (Oral)   Resp 15   Ht 6\' 3"   (1.905 m)   Wt 219 lb (99.3 kg)   SpO2 100%   BMI 27.37 kg/m   Physical Exam Vitals reviewed.  Skin:    General: Skin is warm.     Comments: Site is clean and dry NT no bleeding OP blood tinged 200 cc daily  Neurological:     Mental Status: He is alert.     Imaging: DG CHEST PORT 1 VIEW  Result Date: 05/05/2023 CLINICAL DATA:  Shortness of breath. EXAM: PORTABLE CHEST 1 VIEW COMPARISON:  05/02/2023 FINDINGS: Right apex obscured by the patient's face. Vascular congestion with interstitial opacity at the bases, potentially dependent edema. No evidence for pneumothorax or focal consolidation. No substantial pleural effusion. Cardiopericardial silhouette is at upper limits of normal for size. Telemetry leads overlie the chest. IMPRESSION: Vascular congestion with interstitial opacity at the bases, potentially dependent edema. Electronically Signed   By: Kennith Center M.D.   On: 05/05/2023 14:09   CT GUIDED PERITONEAL/RETROPERITONEAL FLUID DRAIN BY PERC CATH  Result Date: 05/05/2023 INDICATION: 67 year old male status post appendectomy complicated postoperatively by pelvic fluid collection concerning for abscess. EXAM: CT PERC DRAIN PERITONEAL ABCESS COMPARISON:  CT abdomen pelvis from 05/04/2023 MEDICATIONS: The patient is currently admitted to the hospital and receiving intravenous antibiotics. The antibiotics were administered within an appropriate time frame prior to the initiation of the procedure. ANESTHESIA/SEDATION: Moderate (conscious) sedation was employed during this procedure. A total of Versed mg and Fentanyl mcg was administered intravenously. Moderate Sedation Time: 10 minutes. The patient's level of consciousness and vital signs were monitored continuously by radiology nursing throughout the procedure under my direct supervision. CONTRAST:  None COMPLICATIONS: None immediate. PROCEDURE: RADIATION DOSE REDUCTION: This exam was performed according to the departmental  dose-optimization program which includes automated exposure control, adjustment of the mA and/or kV according to patient size and/or use of iterative reconstruction technique. Informed written consent was obtained from the patient and family member after a discussion of the risks, benefits and alternatives to treatment. The patient was placed prone on the CT gantry and a pre procedural CT was performed re-demonstrating the known abscess/fluid collection within the pelvis. The procedure was planned. A timeout was performed prior to the initiation of the procedure. The right gluteal region was prepped and draped in the usual sterile fashion. The overlying soft tissues were anesthetized with 1% lidocaine with epinephrine. Appropriate trajectory was planned with the use of a 22 gauge spinal needle. An 18 gauge trocar needle was advanced into the abscess/fluid collection and a short Amplatz super stiff wire was coiled within the collection. Appropriate positioning was confirmed with a limited CT scan. The tract was serially dilated allowing placement of a 8 [place] all-purpose drainage catheter. Appropriate positioning was confirmed with a limited postprocedural CT scan. Proximally 75 ml of purulent fluid was aspirated. The tube was connected to a bulb suction and sutured in place. A dressing was placed. The patient  tolerated the procedure well without immediate post procedural complication. IMPRESSION: Successful CT guided placement of a right transgluteal 8 [place] all purpose drain catheter into the pelvic abscess with aspiration of approximately 75 mL of purulent fluid. Samples were sent to the laboratory as requested by the ordering clinical team. Marliss Coots, MD [facility] Electronically Signed   By: Marliss Coots M.D.   On: 05/05/2023 14:00   CT ABDOMEN PELVIS WO CONTRAST  Result Date: 05/04/2023 CLINICAL DATA:  Peritonitis, bowel perforation, right  lower quadrant abdominal pain EXAM: CT ABDOMEN AND PELVIS WITHOUT CONTRAST TECHNIQUE: Multidetector CT imaging of the abdomen and pelvis was performed following the standard protocol without IV contrast. RADIATION DOSE REDUCTION: This exam was performed according to the departmental dose-optimization program which includes automated exposure control, adjustment of the mA and/or kV according to patient size and/or use of iterative reconstruction technique. COMPARISON:  04/30/2023 FINDINGS: Lower chest: Advanced emphysema noted within the visualized lung bases. Small right pleural effusion has developed. Hypoattenuation of the cardiac blood pool is in keeping with at least mild anemia. Moderate right coronary artery calcification noted. Hepatobiliary: No focal liver abnormality is seen. No gallstones, gallbladder wall thickening, or biliary dilatation. Pancreas: Unremarkable. No pancreatic ductal dilatation or surrounding inflammatory changes. Spleen: Normal in size without focal abnormality. Adrenals/Urinary Tract: Adrenal glands are unremarkable. Kidneys are normal, without renal calculi, focal lesion, or hydronephrosis. Bladder is unremarkable. Stomach/Bowel: Interval appendectomy. Left lower quadrant surgical drainage catheter loops within the deep pelvis and courses along the right peritoneal wall the level of the mid abdomen. There is extensive infiltration within the surgical bed in keeping with residual inflammatory or postsurgical change. There is a crescentic loculated fluid collection within the deep pelvis extending to this area of inflammatory change superiorly containing several punctate locules of gas suspicious for a developing deep pelvic abscess measuring at least 3.8 x 3.9 x 6.5 cm in dimension on axial image # 76 and coronal image # 60. There is mild infiltration of the peritoneal fat within the right mid abdomen and pelvis as well as enhancement of the peritoneal lining which may reflect changes  of peritonitis. The proximal and mid small bowel appears diffusely mildly dilated and fluid-filled without a discrete point of transition identified in keeping with changes of an adynamic ileus. No free intraperitoneal gas or fluid. Vascular/Lymphatic: Extensive aortoiliac atherosclerotic calcification. No aortic aneurysm. No pathologic adenopathy within the abdomen and pelvis. Reproductive: Prostate is unremarkable. Other: There is extensive infiltration and punctate foci of subcutaneous gas in the region of the umbilicus likely reflecting postsurgical changes following laparoscopic surgery. No discrete subcutaneous fluid collection identified. No hernia identified. Musculoskeletal: No acute bone abnormality. No lytic or blastic bone lesion. IMPRESSION: 1. Interval appendectomy. Crescentic fluid collection within the deep pelvis extending to this area of inflammatory change within the surgical bed containing several punctate locules of gas suspicious for a developing deep pelvic abscess measuring at least 3.8 x 3.9 x 6.5 cm in dimension. This does not communicate with the indwelling surgical drainage catheter. 2. Infiltration of peritoneal fat and peritoneal enhancement in keeping changes of peritonitis. 3. Postoperative adynamic ileus with multiple loops of dilated fluid-filled proximal and mid small bowel. 4. Small right pleural effusion. 5. Moderate right coronary artery calcification. Aortic Atherosclerosis (ICD10-I70.0) and Emphysema (ICD10-J43.9). Electronically Signed   By: Helyn Numbers M.D.   On: 05/04/2023 22:03   VAS [place] LOWER EXTREMITY VENOUS (DVT)  Result Date: 05/03/2023  Lower Venous  DVT Study Patient Name:  Jared [name]  Date of Exam:   05/03/2023 Medical Rec #: [id number]     Accession #:    [id number] Date of Birth: 1955/12/21      Patient Gender: M Patient Age:   4 years Exam Location:  [facility] Procedure:      VAS [place] LOWER EXTREMITY VENOUS (DVT) Referring Phys: Zenia Resides  --------------------------------------------------------------------------------  Indications: Swelling, and Edema.  Risk Factors: Surgery Appendectomy 05/01/23. Comparison Study: No prior study Performing Technologist: Shona Simpson  Examination Guidelines: A complete evaluation includes B-mode imaging, spectral Doppler, color Doppler, and power Doppler as needed of all accessible portions of each vessel. Bilateral testing is considered an integral part of a complete examination. Limited examinations for reoccurring indications may be performed as noted. The reflux portion of the exam is performed with the patient in reverse Trendelenburg.  +---------+---------------+---------+-----------+----------+--------------+ RIGHT    CompressibilityPhasicitySpontaneityPropertiesThrombus Aging +---------+---------------+---------+-----------+----------+--------------+ CFV      Full           Yes      Yes                                 +---------+---------------+---------+-----------+----------+--------------+ SFJ      Full                                                        +---------+---------------+---------+-----------+----------+--------------+ FV Prox  Full                                                        +---------+---------------+---------+-----------+----------+--------------+ FV Mid   Full                                                        +---------+---------------+---------+-----------+----------+--------------+ FV DistalFull                                                        +---------+---------------+---------+-----------+----------+--------------+ PFV      Full                                                        +---------+---------------+---------+-----------+----------+--------------+ POP      Full           Yes      Yes                                 +---------+---------------+---------+-----------+----------+--------------+ PTV       Full                                                        +---------+---------------+---------+-----------+----------+--------------+  PERO     Full                                                        +---------+---------------+---------+-----------+----------+--------------+   +---------+---------------+---------+-----------+----------+--------------+ LEFT     CompressibilityPhasicitySpontaneityPropertiesThrombus Aging +---------+---------------+---------+-----------+----------+--------------+ CFV      Full           Yes      Yes                                 +---------+---------------+---------+-----------+----------+--------------+ SFJ      Full                                                        +---------+---------------+---------+-----------+----------+--------------+ FV Prox  Full                                                        +---------+---------------+---------+-----------+----------+--------------+ FV Mid   Full                                                        +---------+---------------+---------+-----------+----------+--------------+ FV DistalFull                                                        +---------+---------------+---------+-----------+----------+--------------+ PFV      Full                                                        +---------+---------------+---------+-----------+----------+--------------+ POP      Full           Yes      Yes                                 +---------+---------------+---------+-----------+----------+--------------+ PTV      Full                                                        +---------+---------------+---------+-----------+----------+--------------+ PERO     Full                                                        +---------+---------------+---------+-----------+----------+--------------+  Summary: BILATERAL: - No evidence of deep vein  thrombosis seen in the lower extremities, bilaterally. -No evidence of popliteal cyst, bilaterally.   *See table(s) above for measurements and observations. Electronically signed by Gerarda Fraction on 05/03/2023 at 3:40:10 PM.    Final    Overnight EEG with video  Result Date: 05/03/2023 Charlsie Quest, MD     05/03/2023  7:38 PM Patient Name: Jared [name] MRN: [id number] Epilepsy Attending: Charlsie Quest Referring Physician/Provider: Gevena Mart, NP Duration: 05/02/2023 1937 to 05/03/2023 1550 Patient history: 67 yo M with arm tremors, hx of tremors with LOC, frequency increased in the last 3 months, had episode today without LOC. EEG to evaluate for seizure. Level of alertness: Awake, asleep AEDs during EEG study: GBP Technical aspects: This EEG study was done with scalp electrodes positioned according to the 10-20 International system of electrode placement. Electrical activity was reviewed with band pass filter of 1-70Hz , sensitivity of 7 uV/mm, display speed of 29mm/sec with a 60Hz  notched filter applied as appropriate. EEG data were recorded continuously and digitally stored.  Video monitoring was available and reviewed as appropriate. Description: The posterior dominant rhythm consists of 8 Hz activity of moderate voltage (25-35 uV) seen predominantly in posterior head regions, symmetric and reactive to eye opening and eye closing. Sleep was characterized by vertex waves, sleep spindles (12 to 14 Hz), maximal frontocentral region.  Event button was pressed over 50 times after 05/03/2023 0416 for tremors of the upper extremity. Concomitant EEG before, during and after the event showed normal posterior dominant rhythm, did not show any EEG changes suggest seizure. Hyperventilation and photic stimulation were not performed.   IMPRESSION: This study is within normal limits. No seizures or epileptiform discharges were seen throughout the recording. Event button was pressed several times during the study  for tremors of upper extremity without concomitant EEG change.  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 Charlsie Quest   NM Pulmonary Perf and Vent  Result Date: 05/02/2023 CLINICAL DATA:  Evaluate for pulmonary embolism. EXAM: NUCLEAR MEDICINE PERFUSION LUNG SCAN TECHNIQUE: Perfusion images were obtained in multiple projections after intravenous injection of radiopharmaceutical. Ventilation scans intentionally deferred if perfusion scan and chest x-ray adequate for interpretation during COVID 19 epidemic. RADIOPHARMACEUTICALS:  4 mCi Tc-47m MAA IV COMPARISON:  Chest radiograph-earlier same day; 04/30/2023 Chest CT-07/12/2022 FINDINGS: Review of chest radiograph performed earlier same day demonstrates unchanged enlarged cardiac silhouette and mediastinal contours. Old left-sided rib fractures. No focal airspace opacities. No pleural effusion or pneumothorax. No evidence of edema Perfusion imaging demonstrates mild heterogeneous perfusion of the bilateral pulmonary parenchyma without discrete area of non perfusion to suggest pulmonary embolism. IMPRESSION: Pulmonary embolism absent. No discrete areas of non perfusion to suggest pulmonary embolism. Electronically Signed   By: Simonne Come M.D.   On: 05/02/2023 17:35   DG CHEST PORT 1 VIEW  Result Date: 05/02/2023 CLINICAL DATA:  Dyspnea EXAM: PORTABLE CHEST 1 VIEW COMPARISON:  None Available. FINDINGS: Normal mediastinum and cardiac silhouette. Normal pulmonary vasculature. No evidence of effusion, infiltrate, or pneumothorax. No acute bony abnormality. IMPRESSION: Normal chest radiograph Electronically Signed   By: Genevive Bi M.D.   On: 05/02/2023 16:32   VAS [place] IVC/ILIAC (VENOUS ONLY)  Result Date: 05/02/2023 IVC/ILIAC STUDY Patient Name:  Jared [name]  Date of Exam:   05/02/2023 Medical Rec #: [id number]     Accession #:    [id number] Date of Birth: [birth year]      Patient Gender: M Patient Age:   22  years Exam Location[facility]  [facility] Procedure:       VAS [place] IVC/ILIAC (VENOUS ONLY) Referring Phys: [name] YATES --------------------------------------------------------------------------------  Indications: A vague density area of concern in the IVC was noted on echo exam. Limitations: Air/bowel gas and Status post appendectomy laparoscopic 05/01/23.  Performing Technologist: Marilynne Halsted RDMS, RVT  Examination Guidelines: A complete evaluation includes B-mode imaging, spectral Doppler, color Doppler, and power Doppler as needed of all accessible portions of each vessel. Bilateral testing is considered an integral part of a complete examination. Limited examinations for reoccurring indications may be performed as noted.  IVC/Iliac Findings: +----------+------+--------+-----------------+    IVC    PatentThrombus    Comments      +----------+------+--------+-----------------+ IVC Prox                poorly visualized +----------+------+--------+-----------------+ IVC Mid                 poorly visualized +----------+------+--------+-----------------+ IVC Distal              not visualized    +----------+------+--------+-----------------+    Summary: IVC/Iliac: Visualization of proximal Inferior Vena Cava, mid inferior vena cava and distal Inferior Vena Cava was limited. 2D imaging with color evaluation of the IVC was non- diagnostic due to patient's body habitus and air in the abdominal cavity.  *See table(s) above for measurements and observations.  Electronically signed by Carolynn Sayers on 05/02/2023 at 4:10:22 PM.   Final    ECHOCARDIOGRAM COMPLETE  Result Date: 05/02/2023    ECHOCARDIOGRAM REPORT   Patient Name:   Jared [name] Date of Exam: 05/02/2023 Medical Rec #:  [id number]    Height:       75.0 in Accession #:    [id number]   Weight:       219.0 lb Date of Birth:  1955-07-17     BSA:          2.281 m Patient Age:    67 years     BP:           107/69 mmHg Patient Gender: M            HR:           104 bpm. Exam Location:  Inpatient  Procedure: 2D Echo, Color Doppler, Cardiac Doppler and Intracardiac            Opacification Agent Indications:    R55 Syncope  History:        Patient has prior history of Echocardiogram examinations, most                 recent 12/13/2021. CAD, COPD, Arrythmias:Atrial Fibrillation; Risk                 Factors:Hypertension and Diabetes.  Sonographer:    Irving Burton Senior RDCS Referring Phys: [id number] Francena Hanly [facility]  Sonographer Comments: Very poor echo windows due to body habitus and COPD IMPRESSIONS  1. Left ventricular ejection fraction, by estimation, is 55 to 60%. The left ventricle has normal function. The left ventricle has no regional wall motion abnormalities. Left ventricular diastolic parameters are consistent with Grade II diastolic dysfunction (pseudonormalization).  2. Right ventricular systolic function is moderately reduced. The right ventricular size is moderately enlarged. Tricuspid regurgitation signal is inadequate for assessing PA pressure.  3. Left atrial size was mildly dilated.  4. The mitral valve is degenerative. No evidence of mitral valve regurgitation. No evidence of mitral stenosis.  5. The aortic valve is  tricuspid. Aortic valve regurgitation is not visualized. Aortic valve sclerosis/calcification is present, without any evidence of aortic stenosis.  6. There is a vague density in the IVC. Cannot rule out thrombus. Recommend dedicated Abdominal CTA vs. Abdominal US for further evaluation. The inferior vena cava is dilated in size with >50% respiratory variability, suggesting right atrial pressure of  8 mmHg.  7. In setting of syncope as well as RV dysfunction, consider acute PE. Recommend Chest CTA if clinically indicated. FINDINGS  Left Ventricle: Left ventricular ejection fraction, by estimation, is 55 to 60%. The left ventricle has normal function. The left ventricle has no regional wall motion abnormalities. Definity contrast agent was given IV to delineate the left ventricular   endocardial borders. The left ventricular internal cavity size was normal in size. There is no left ventricular hypertrophy. Left ventricular diastolic parameters are consistent with Grade II diastolic dysfunction (pseudonormalization). Normal left ventricular filling pressure. Right Ventricle: The right ventricular size is moderately enlarged. No increase in right ventricular wall thickness. Right ventricular systolic function is moderately reduced. Tricuspid regurgitation signal is inadequate for assessing PA pressure. Left Atrium: Left atrial size was mildly dilated. Right Atrium: Right atrial size was normal in size. Pericardium: There is no evidence of pericardial effusion. Presence of epicardial fat layer. Mitral Valve: The mitral valve is degenerative in appearance. There is mild calcification of the mitral valve leaflet(s). Mild to moderate mitral annular calcification. No evidence of mitral valve regurgitation. No evidence of mitral valve stenosis. Tricuspid Valve: The tricuspid valve is normal in structure. Tricuspid valve regurgitation is trivial. No evidence of tricuspid stenosis. Aortic Valve: The aortic valve is tricuspid. Aortic valve regurgitation is not visualized. Aortic valve sclerosis/calcification is present, without any evidence of aortic stenosis. Pulmonic Valve: The pulmonic valve was normal in structure. Pulmonic valve regurgitation is not visualized. No evidence of pulmonic stenosis. Aorta: The aortic root is normal in size and structure. Venous: There is a vague density in the IVC. Cannot rule out thrombus. Recommend dedicated Abdominal CTA vs. Abdominal US for further evaluation. The inferior vena cava is dilated in size with greater than 50% respiratory variability, suggesting right atrial pressure of 8 mmHg. IAS/Shunts: No atrial level shunt detected by color flow Doppler.  LEFT VENTRICLE PLAX 2D LVIDd:         4.90 cm     Diastology LVIDs:         3.80 cm     LV e' medial:    6.20 cm/s  LV PW:         1.10 cm     LV E/e' medial:  10.9 LV IVS:        1.00 cm     LV e' lateral:   6.31 cm/s LVOT diam:     2.10 cm     LV E/e' lateral: 10.7 LV SV:         47 LV SV Index:   21 LVOT Area:     3.46 cm  LV Volumes (MOD) LV vol d, MOD A4C: 98.9 ml LV vol s, MOD A4C: 57.8 ml LV SV MOD A4C:     98.9 ml RIGHT VENTRICLE RV S prime:     9.36 cm/s TAPSE (M-mode): 1.9 cm LEFT ATRIUM             Index        RIGHT ATRIUM           Index LA diam:  3.30 cm 1.45 cm/m   RA Area:     23.70 cm LA Vol (A2C):   80.6 ml 35.34 ml/m  RA Volume:   71.30 ml  31.26 ml/m LA Vol (A4C):   74.4 ml 32.62 ml/m LA Biplane Vol: 81.7 ml 35.82 ml/m  AORTIC VALVE LVOT Vmax:   88.10 cm/s LVOT Vmean:  65.100 cm/s LVOT VTI:    0.135 m  AORTA Ao Root diam: 3.40 cm MITRAL VALVE MV Area (PHT): 3.39 cm    SHUNTS MV Decel Time: 224 msec    Systemic VTI:  0.14 m MV E velocity: 67.30 cm/s  Systemic Diam: 2.10 cm MV A velocity: 60.80 cm/s MV E/A ratio:  1.11 Armanda Magic MD Electronically signed by Armanda Magic MD Signature Date/Time: 05/02/2023/12:22:10 PM    Final     Labs:  CBC: Recent Labs    05/03/23 0657 05/04/23 0550 05/05/23 0633 05/06/23 0221  WBC 12.9* 14.5* 15.9* 15.2*  HGB 8.8* 8.2* 8.3* 8.1*  HCT 30.5* 27.9* 28.5* 27.6*  PLT 359 375 440* 484*    COAGS: Recent Labs    04/30/23 1144 05/05/23 0848  INR 1.7* 1.0  APTT 45*  --     BMP: Recent Labs    05/03/23 0657 05/04/23 0550 05/05/23 0848 05/06/23 0221  NA 131* 131* 134* 135  K 4.2 3.9 3.8 3.7  CL 94* 92* 93* 94*  CO2 31 29 27 31   GLUCOSE 148* 150* 113* 111*  BUN 23 19 12 11   CALCIUM 8.1* 8.3* 8.6* 8.6*  CREATININE 1.93* 1.44* 1.07 1.01  GFRNONAA 37* 53* >60 >60    LIVER FUNCTION TESTS: Recent Labs    12/26/22 0939 04/30/23 1144 05/02/23 0755 05/03/23 1523 05/04/23 0550  BILITOT 0.4 1.3* 0.4 0.5  --   AST 23 32 21 36  --   ALT 21 18 14 16   --   ALKPHOS 66 72 57 100  --   PROT 6.5 7.3 6.0* 6.1*  --   ALBUMIN 3.5 3.7 2.7*  2.4* 2.2*   Drain Location: TG drain Size: 8 Fr Date of placement: 11/24  Currently to: Drain collection device: suction bulb 24 hour output:  Output by Drain (mL) 05/04/23 0701 - 05/04/23 1900 05/04/23 1901 - 05/05/23 0700 05/05/23 0701 - 05/05/23 1900 05/05/23 1901 - 05/06/23 0700 05/06/23 0701 - 05/06/23 0930  Closed System Drain 1 Right;Posterior;Lateral Buttock Bulb (JP) 8 Fr.   25 15   Open Drain 1 Abdomen 19 Fr. 10 5  25      Interval imaging/drain manipulation:  none  Current examination: Flushes/aspirates easily.  Insertion site unremarkable. Suture and stat lock in place. Dressed appropriately.   Plan: Continue TID flushes with 5 cc NS. Record output Q shift. Dressing changes QD or PRN if soiled.  Call IR APP or on call IR MD if difficulty flushing or sudden change in drain output.  Repeat imaging/possible drain injection once output < 10 mL/QD (excluding flush material). Consideration for drain removal if output is < 10 mL/QD (excluding flush material), pending discussion with the providing surgical service.  Discharge planning: Please contact IR APP or on call IR MD prior to patient d/c to ensure appropriate follow up plans are in place. Typically patient will follow up with IR clinic 10-14 days post d/c for repeat imaging/possible drain injection. IR scheduler will contact patient with date/time of appointment. Patient will need to flush drain QD with 5 cc NS, record output QD, dressing changes every 2-3 days or  earlier if soiled.   IR will continue to follow - please call with questions or concerns.  Assessment and Plan:  Perforated appendix; abscess TG drain placed in IR 11/24 Will follow with [facility]  Electronically Signed: Robet Leu, PA-C 05/06/2023, 9:30 AM   I spent a total of 15 Minutes at the the patient's bedside AND on the patient's hospital floor or unit, greater than 50% of which was counseling/coordinating care for TG abscess drain

## 2023-05-06 NOTE — Progress Notes (Signed)
PHARMACY - ANTICOAGULATION CONSULT NOTE  Pharmacy Consult for heparin Indication:  PAF and possible VTE  Labs: Recent Labs    05/03/23 0657 05/04/23 0550 05/04/23 1533 05/05/23 0053 05/05/23 0633 05/05/23 0848 05/05/23 2322  HGB 8.8* 8.2*  --   --  8.3*  --   --   HCT 30.5* 27.9*  --   --  28.5*  --   --   PLT 359 375  --   --  440*  --   --   LABPROT  --   --   --   --   --  13.7  --   INR  --   --   --   --   --  1.0  --   HEPARINUNFRC 0.62 0.33   < > 0.43 0.49  --  0.52  CREATININE 1.93* 1.44*  --   --   --  1.07  --    < > = values in this interval not displayed.   Assessment/Plan:  67yo male therapeutic on heparin after resumed after drain placement; no signs of bleeding since resuming per RN. Will continue infusion at current rate of 1900 units/hr and monitor daily level.  Vernard Gambles, PharmD, BCPS 05/06/2023 12:07 AM

## 2023-05-06 NOTE — Evaluation (Signed)
Physical Therapy RE-Evaluation Patient Details Name: Jared [name] MRN: [id number] DOB: [birth year] Today's Date: 05/06/2023  History of Present Illness  67 y.o. male presents to [facility] 04/30/23 w/ acute L lower abdominal pain, L testicle pain, and h/a. Admitted w/ sepsis 2/2 acute perforated appendicitis s/p laparoscopic appendectomy 11/20. Echo w/ density in IVC, VQ scan negative. 11/24 developed Acute on chronic hypoxic respiratory failure, transferred to ICU. PMHx: AVM, chronic respiratory failure (2L O2 at home) CABG, CAD, CAP, CHF, COPD, CVA, HTN, A flutter, DM2.   Clinical Impression  Re-evaluated following transfer to ICU for respiratory failure. Pt on 2L supplemental O2 during assessment (baseline 2L at home), SpO2 91-95%  throughout assessment today. He was incontinent of urine. Pt requires min assist for transfer and short distance gait to recliner in room. Increased DOE 2/3 and complaints of dizziness in standing. BP without change between bed and recliner, however unable to obtain a BP while standing. Assisted with peri-care.  Due to functional decline since initial evaluation and no assist available at d/c, would recommend post acute rehab at [facility]. Will continue to progress pt during admission and update recs as appropriate if he improves significantly. Patient will continue to benefit from skilled physical therapy services to further improve independence with functional mobility. Patient will benefit from continued inpatient follow up therapy, <3 hours/day.      If plan is discharge home, recommend the following: A little help with walking and/or transfers;Assist for transportation;Help with stairs or ramp for entrance;A little help with bathing/dressing/bathroom;Assistance with cooking/housework;Direct supervision/assist for medications management;Direct supervision/assist for financial management;Supervision due to cognitive status   Can travel by private vehicle   Yes    Equipment  Recommendations None recommended by PT  Recommendations for Other Services       Functional Status Assessment Patient has had a recent decline in their functional status and demonstrates the ability to make significant improvements in function in a reasonable and predictable amount of time.     Precautions / Restrictions Precautions Precautions: Fall Precaution Comments: abdominal surgery, open drain  abdomen Restrictions Weight Bearing Restrictions: No      Mobility  Bed Mobility Overal bed mobility: Needs Assistance Bed Mobility: Supine to Sit     Supine to sit: HOB elevated, Used rails, Contact guard     General bed mobility comments: CGA for safety with cues to facilitiate and sequence comfortably and efficently.    Transfers Overall transfer level: Needs assistance Equipment used: Rolling walker (2 wheels) Transfers: Sit to/from Stand Sit to Stand: Min assist           General transfer comment: Min assist for boost and balance. Sits spontaneously after rising. Cues for technique, safety, and awareness. RW for support. Decreased control upon descent into chair, cues for hand placement when reaching back to lower safely.    Ambulation/Gait Ambulation/Gait assistance: Min assist Gait Distance (Feet): 5 Feet Assistive device: Rolling walker (2 wheels) Gait Pattern/deviations: Step-through pattern, Shuffle, Decreased stride length Gait velocity: dec     General Gait Details: Min assist for RW control, heavy VC for directions to chair. No buckling however demonstrates impaired balance and excessive anterior lean onto RW.  Stairs            Wheelchair Mobility     Tilt Bed    Modified Rankin (Stroke Patients Only)       Balance Overall balance assessment: Needs assistance Sitting-balance support: No upper extremity supported, Feet supported Sitting balance-Leahy Scale: Fair  Standing balance support: Bilateral upper extremity supported, During  functional activity, Reliant on assistive device for balance Standing balance-Leahy Scale: Poor Standing balance comment: reliant on RW                             Pertinent Vitals/Pain Pain Assessment Pain Assessment: Faces Faces Pain Scale: Hurts little more Pain Location: abdomen incision Pain Descriptors / Indicators: Operative site guarding, Grimacing, Guarding Pain Intervention(s): Monitored during session, Repositioned    Home Living Family/patient expects to be discharged to:: Private residence Living Arrangements: Non-relatives/Friends Available Help at Discharge: Other (Comment) (no support) Type of Home: House Home Access: Stairs to enter Entrance Stairs-Rails: Can reach both;Left;Right Entrance Stairs-Number of Steps: 4   Home Layout: One level Home Equipment: Rollator (4 wheels);Rolling Walker (2 wheels);Shower seat;Grab bars - tub/shower;BSC/3in1 Additional Comments: 2L O2 all times    Prior Function Prior Level of Function : Independent/Modified Independent;History of Falls (last six months)             Mobility Comments: Rollator in community, no AD in home. Pt reports falls after experiencing syncope. Pt can usually tell when these episodes will happen ADLs Comments: Mod I with ADL/IADLs, sister or medicaid provides transportation     Extremity/Trunk Assessment   Upper Extremity Assessment Upper Extremity Assessment: Defer to OT evaluation    Lower Extremity Assessment Lower Extremity Assessment: Generalized weakness    Cervical / Trunk Assessment Cervical / Trunk Assessment: Normal  Communication   Communication Communication: No apparent difficulties  Cognition Arousal: Alert Behavior During Therapy: WFL for tasks assessed/performed Overall Cognitive Status: No family/caregiver present to determine baseline cognitive functioning                                 General Comments: some confusion        General  Comments General comments (skin integrity, edema, etc.): SpO2 91-95 on 2L supplemental O2, DOE 2/3. SBP 140s-150s with activity. Pt reports dizziness in standing. Urinary incontinence in bed.    Exercises     Assessment/Plan    PT Assessment Patient needs continued PT services  PT Problem List Decreased activity tolerance;Decreased balance;Decreased mobility;Decreased safety awareness;Decreased strength;Decreased coordination;Decreased cognition;Decreased knowledge of use of DME;Decreased knowledge of precautions       PT Treatment Interventions DME instruction;Stair training;Gait training;Functional mobility training;Therapeutic activities;Balance training;Therapeutic exercise;Patient/family education;Neuromuscular re-education;Cognitive remediation    PT Goals (Current goals can be found in the Care Plan section)  Acute Rehab PT Goals Patient Stated Goal: to go home PT Goal Formulation: With patient Time For Goal Achievement: 05/20/23 Potential to Achieve Goals: Good    Frequency Min 1X/week     Co-evaluation               AM-PAC PT "6 Clicks" Mobility  Outcome Measure Help needed turning from your back to your side while in a flat bed without using bedrails?: None Help needed moving from lying on your back to sitting on the side of a flat bed without using bedrails?: A Little Help needed moving to and from a bed to a chair (including a wheelchair)?: A Little Help needed standing up from a chair using your arms (e.g., wheelchair or bedside chair)?: A Little Help needed to walk in hospital room?: A Little Help needed climbing 3-5 steps with a railing? : A Lot 6 Click Score: 18    End  of Session Equipment Utilized During Treatment: Oxygen Activity Tolerance: Patient tolerated treatment well Patient left: in chair;with call bell/phone within reach;with chair alarm set;with nursing/sitter in room Nurse Communication: Mobility status PT Visit Diagnosis: Unsteadiness on  feet (R26.81);Repeated falls (R29.6);Other abnormalities of gait and mobility (R26.89);Muscle weakness (generalized) (M62.81);Other symptoms and signs involving the nervous system (R29.898);Dizziness and giddiness (R42)    Time: 4540-9811 PT Time Calculation (min) (ACUTE ONLY): 17 min   Charges:   PT Evaluation $PT Re-evaluation: 1 Re-eval   PT General Charges $$ ACUTE PT VISIT: 1 Visit         Kathlyn Sacramento, PT, DPT [facility]  Rehabilitation Services Physical Therapist Office: [phone] Website: [email or web address]   Berton Mount 05/06/2023, 12:32 PM

## 2023-05-06 NOTE — Progress Notes (Signed)
No show

## 2023-05-06 NOTE — Progress Notes (Signed)
5 Days Post-Op   Subjective/Chief Complaint: Pt doing well this aM    Objective: Vital signs in last 24 hours: Temp:  [97.8 F (36.6 C)-99 F (37.2 C)] 98.2 F (36.8 C) (11/25 0712) Pulse Rate:  [89-191] 100 (11/25 0730) Resp:  [13-25] 16 (11/25 0730) BP: (84-173)/(68-129) 169/89 (11/25 0730) SpO2:  [87 %-100 %] 100 % (11/25 0839) Last BM Date : 05/02/23  Intake/Output from previous day: 11/24 0701 - 11/25 0700 In: 943.4 [P.O.:360; I.V.:268.4; IV Piggyback:315] Out: 1015 [Urine:950; Drains:65] Intake/Output this shift: No intake/output data recorded.  PE:  Constitutional: No acute distress, conversant, appears states age. Eyes: Anicteric sclerae, moist conjunctiva, no lid lag Lungs: Clear to auscultation bilaterally, normal respiratory effort CV: regular rate and rhythm, no murmurs, no peripheral edema, pedal pulses 2+ GI: Soft, no masses or hepatosplenomegaly, non-tender to palpation, distended, Dr-SS. Skin: No rashes, palpation reveals normal turgor Psychiatric: appropriate judgment and insight, oriented to person, place, and time   Lab Results:  Recent Labs    05/05/23 0633 05/06/23 0221  WBC 15.9* 15.2*  HGB 8.3* 8.1*  HCT 28.5* 27.6*  PLT 440* 484*   BMET Recent Labs    05/05/23 0848 05/06/23 0221  NA 134* 135  K 3.8 3.7  CL 93* 94*  CO2 27 31  GLUCOSE 113* 111*  BUN 12 11  CREATININE 1.07 1.01  CALCIUM 8.6* 8.6*   PT/INR Recent Labs    05/05/23 0848  LABPROT 13.7  INR 1.0   ABG Recent Labs    05/05/23 1350  PHART 7.27*  HCO3 36.3*    Studies/Results: DG CHEST PORT 1 VIEW  Result Date: 05/05/2023 CLINICAL DATA:  Shortness of breath. EXAM: PORTABLE CHEST 1 VIEW COMPARISON:  05/02/2023 FINDINGS: Right apex obscured by the patient's face. Vascular congestion with interstitial opacity at the bases, potentially dependent edema. No evidence for pneumothorax or focal consolidation. No substantial pleural effusion. Cardiopericardial  silhouette is at upper limits of normal for size. Telemetry leads overlie the chest. IMPRESSION: Vascular congestion with interstitial opacity at the bases, potentially dependent edema. Electronically Signed   By: Kennith Center M.D.   On: 05/05/2023 14:09   CT GUIDED PERITONEAL/RETROPERITONEAL FLUID DRAIN BY PERC CATH  Result Date: 05/05/2023 INDICATION: 67 year old male status post appendectomy complicated postoperatively by pelvic fluid collection concerning for abscess. EXAM: CT PERC DRAIN PERITONEAL ABCESS COMPARISON:  CT abdomen pelvis from 05/04/2023 MEDICATIONS: The patient is currently admitted to the hospital and receiving intravenous antibiotics. The antibiotics were administered within an appropriate time frame prior to the initiation of the procedure. ANESTHESIA/SEDATION: Moderate (conscious) sedation was employed during this procedure. A total of Versed mg and Fentanyl mcg was administered intravenously. Moderate Sedation Time: 10 minutes. The patient's level of consciousness and vital signs were monitored continuously by radiology nursing throughout the procedure under my direct supervision. CONTRAST:  None COMPLICATIONS: None immediate. PROCEDURE: RADIATION DOSE REDUCTION: This exam was performed according to the departmental dose-optimization program which includes automated exposure control, adjustment of the mA and/or kV according to patient size and/or use of iterative reconstruction technique. Informed written consent was obtained from the patient and family member after a discussion of the risks, benefits and alternatives to treatment. The patient was placed prone on the CT gantry and a pre procedural CT was performed re-demonstrating the known abscess/fluid collection within the pelvis. The procedure was planned. A timeout was performed prior to the initiation of the procedure. The right gluteal region was prepped and draped in the usual  sterile fashion. The overlying soft tissues were  anesthetized with 1% lidocaine with epinephrine. Appropriate trajectory was planned with the use of a 22 gauge spinal needle. An 18 gauge trocar needle was advanced into the abscess/fluid collection and a short Amplatz super stiff wire was coiled within the collection. Appropriate positioning was confirmed with a limited CT scan. The tract was serially dilated allowing placement of a 8 [place] all-purpose drainage catheter. Appropriate positioning was confirmed with a limited postprocedural CT scan. Proximally 75 ml of purulent fluid was aspirated. The tube was connected to a bulb suction and sutured in place. A dressing was placed. The patient tolerated the procedure well without immediate post procedural complication. IMPRESSION: Successful CT guided placement of a right transgluteal 8 [place] all purpose drain catheter into the pelvic abscess with aspiration of approximately 75 mL of purulent fluid. Samples were sent to the laboratory as requested by the ordering clinical team. Marliss Coots, MD [facility] Electronically Signed   By: Marliss Coots M.D.   On: 05/05/2023 14:00   CT ABDOMEN PELVIS WO CONTRAST  Result Date: 05/04/2023 CLINICAL DATA:  Peritonitis, bowel perforation, right lower quadrant abdominal pain EXAM: CT ABDOMEN AND PELVIS WITHOUT CONTRAST TECHNIQUE: Multidetector CT imaging of the abdomen and pelvis was performed following the standard protocol without IV contrast. RADIATION DOSE REDUCTION: This exam was performed according to the departmental dose-optimization program which includes automated exposure control, adjustment of the mA and/or kV according to patient size and/or use of iterative reconstruction technique. COMPARISON:  04/30/2023 FINDINGS: Lower chest: Advanced emphysema noted within the visualized lung bases. Small right pleural effusion has developed. Hypoattenuation of the cardiac blood pool is in keeping with at least  mild anemia. Moderate right coronary artery calcification noted. Hepatobiliary: No focal liver abnormality is seen. No gallstones, gallbladder wall thickening, or biliary dilatation. Pancreas: Unremarkable. No pancreatic ductal dilatation or surrounding inflammatory changes. Spleen: Normal in size without focal abnormality. Adrenals/Urinary Tract: Adrenal glands are unremarkable. Kidneys are normal, without renal calculi, focal lesion, or hydronephrosis. Bladder is unremarkable. Stomach/Bowel: Interval appendectomy. Left lower quadrant surgical drainage catheter loops within the deep pelvis and courses along the right peritoneal wall the level of the mid abdomen. There is extensive infiltration within the surgical bed in keeping with residual inflammatory or postsurgical change. There is a crescentic loculated fluid collection within the deep pelvis extending to this area of inflammatory change superiorly containing several punctate locules of gas suspicious for a developing deep pelvic abscess measuring at least 3.8 x 3.9 x 6.5 cm in dimension on axial image # 76 and coronal image # 60. There is mild infiltration of the peritoneal fat within the right mid abdomen and pelvis as well as enhancement of the peritoneal lining which may reflect changes of peritonitis. The proximal and mid small bowel appears diffusely mildly dilated and fluid-filled without a discrete point of transition identified in keeping with changes of an adynamic ileus. No free intraperitoneal gas or fluid. Vascular/Lymphatic: Extensive aortoiliac atherosclerotic calcification. No aortic aneurysm. No pathologic adenopathy within the abdomen and pelvis. Reproductive: Prostate is unremarkable. Other: There is extensive infiltration and punctate foci of subcutaneous gas in the region of the umbilicus likely reflecting postsurgical changes following laparoscopic surgery. No discrete subcutaneous fluid collection identified. No hernia identified.  Musculoskeletal: No acute bone abnormality. No lytic or blastic bone lesion. IMPRESSION: 1. Interval appendectomy. Crescentic fluid collection within the deep pelvis extending to this area of inflammatory change within the surgical  bed containing several punctate locules of gas suspicious for a developing deep pelvic abscess measuring at least 3.8 x 3.9 x 6.5 cm in dimension. This does not communicate with the indwelling surgical drainage catheter. 2. Infiltration of peritoneal fat and peritoneal enhancement in keeping changes of peritonitis. 3. Postoperative adynamic ileus with multiple loops of dilated fluid-filled proximal and mid small bowel. 4. Small right pleural effusion. 5. Moderate right coronary artery calcification. Aortic Atherosclerosis (ICD10-I70.0) and Emphysema (ICD10-J43.9). Electronically Signed   By: Helyn Numbers M.D.   On: 05/04/2023 22:03    Anti-infectives: Anti-infectives (From admission, onward)    Start     Dose/Rate Route Frequency Ordered Stop   04/30/23 1930  piperacillin-tazobactam (ZOSYN) IVPB 3.375 g        3.375 g 12.5 mL/hr over 240 Minutes Intravenous Every 8 hours 04/30/23 1915 05/05/23 1940   04/30/23 1145  piperacillin-tazobactam (ZOSYN) IVPB 3.375 g        3.375 g 100 mL/hr over 30 Minutes Intravenous  Once 04/30/23 1131 04/30/23 1242       Assessment/Plan: POD#5 - lap appy for perforated appendicitis - MW - WBC 15.2 and stable - s/p IR drain placement, SS - AKI, creatinine improving - nephrology following - ok for Soft diet - oob/pulm toilet; encouraged ambulation in halls - continue IV Zosyn   FEN: soft,  IVF, miralax ID: [email or web address] 11/19> 11/25 VTE: hold xarelto. SCDs. Hep gtt   Appeciate TRH assistance Syncope COPD/Chronic resp failure - on 2 lpm at baseline HTN Atrial flutter - xarelto held, on heparin gtts H/o CVA PVD  LOS: 6 days    Axel Filler 05/06/2023

## 2023-05-07 ENCOUNTER — Inpatient Hospital Stay (HOSPITAL_COMMUNITY): Payer: Medicare HMO

## 2023-05-07 DIAGNOSIS — I5020 Unspecified systolic (congestive) heart failure: Secondary | ICD-10-CM | POA: Diagnosis not present

## 2023-05-07 DIAGNOSIS — I4891 Unspecified atrial fibrillation: Secondary | ICD-10-CM

## 2023-05-07 DIAGNOSIS — K3532 Acute appendicitis with perforation and localized peritonitis, without abscess: Secondary | ICD-10-CM | POA: Diagnosis not present

## 2023-05-07 DIAGNOSIS — R55 Syncope and collapse: Secondary | ICD-10-CM | POA: Diagnosis not present

## 2023-05-07 LAB — MAGNESIUM: Magnesium: 2.2 mg/dL (ref 1.7–2.4)

## 2023-05-07 LAB — GLUCOSE, CAPILLARY
Glucose-Capillary: 120 mg/dL — ABNORMAL HIGH (ref 70–99)
Glucose-Capillary: 107 mg/dL — ABNORMAL HIGH (ref 70–99)

## 2023-05-07 LAB — CBC
HCT: 30.9 % — ABNORMAL LOW (ref 39.0–52.0)
Hemoglobin: 8.9 g/dL — ABNORMAL LOW (ref 13.0–17.0)
MCH: 25.7 pg — ABNORMAL LOW (ref 26.0–34.0)
MCHC: 28.8 g/dL — ABNORMAL LOW (ref 30.0–36.0)
MCV: 89.3 fL (ref 80.0–100.0)
Platelets: 528 10*3/uL — ABNORMAL HIGH (ref 150–400)
RBC: 3.46 MIL/uL — ABNORMAL LOW (ref 4.22–5.81)
RDW: 17.3 % — ABNORMAL HIGH (ref 11.5–15.5)
WBC: 14.1 10*3/uL — ABNORMAL HIGH (ref 4.0–10.5)
nRBC: 0.4 % — ABNORMAL HIGH (ref 0.0–0.2)

## 2023-05-07 LAB — BASIC METABOLIC PANEL
Anion gap: 10 (ref 5–15)
BUN: 8 mg/dL (ref 8–23)
CO2: 33 mmol/L — ABNORMAL HIGH (ref 22–32)
Calcium: 8.6 mg/dL — ABNORMAL LOW (ref 8.9–10.3)
Chloride: 92 mmol/L — ABNORMAL LOW (ref 98–111)
Creatinine, Ser: 0.9 mg/dL (ref 0.61–1.24)
GFR, Estimated: >60 mL/min (ref >60)
Glucose, Bld: 123 mg/dL — ABNORMAL HIGH (ref 70–99)
Potassium: 3.9 mmol/L (ref 3.5–5.1)
Sodium: 135 mmol/L (ref 135–145)

## 2023-05-07 LAB — BRAIN NATRIURETIC PEPTIDE: B Natriuretic Peptide: 203 pg/mL — ABNORMAL HIGH (ref 0.0–100.0)

## 2023-05-07 LAB — HEPARIN LEVEL (UNFRACTIONATED): Heparin Unfractionated: 0.4 [IU]/mL (ref 0.30–0.70)

## 2023-05-07 MED ORDER — FUROSEMIDE 10 MG/ML IJ SOLN
40.0000 mg | Freq: Once | INTRAMUSCULAR | Status: AC
  Administered 2023-05-07: 40 mg via INTRAVENOUS
  Filled 2023-05-07: qty 4

## 2023-05-07 MED ORDER — BISACODYL 10 MG RE SUPP
10.0000 mg | Freq: Once | RECTAL | Status: AC
  Administered 2023-05-07: 10 mg via RECTAL
  Filled 2023-05-07: qty 1

## 2023-05-07 MED ORDER — PIPERACILLIN-TAZOBACTAM 3.375 G IVPB
3.3750 g | Freq: Three times a day (TID) | INTRAVENOUS | Status: DC
  Administered 2023-05-07 – 2023-05-14 (×22): 3.375 g via INTRAVENOUS
  Filled 2023-05-07 (×22): qty 50

## 2023-05-07 MED ORDER — PIPERACILLIN-TAZOBACTAM 3.375 G IVPB 30 MIN: 3.3750 g | Freq: Three times a day (TID) | INTRAVENOUS | Status: DC

## 2023-05-07 MED ORDER — DOCUSATE SODIUM 100 MG PO CAPS
100.0000 mg | ORAL_CAPSULE | Freq: Two times a day (BID) | ORAL | Status: DC
  Administered 2023-05-07 – 2023-05-15 (×11): 100 mg via ORAL
  Filled 2023-05-07 (×25): qty 1

## 2023-05-07 MED ORDER — FUROSEMIDE 40 MG PO TABS
40.0000 mg | ORAL_TABLET | Freq: Every day | ORAL | Status: DC
  Administered 2023-05-08 – 2023-05-09 (×2): 40 mg via ORAL
  Filled 2023-05-07 (×2): qty 1

## 2023-05-07 NOTE — Progress Notes (Signed)
Occupational Therapy Treatment Patient Details Name: Jared [name] MRN: [id number] DOB: 09-Mar-1956 Today's Date: 05/07/2023   Clinical Impression: Pt progressing towards OT goals this session. Initially lethargic but better arousal at EOB. Pt completed toilet transfer, ambulating into bathroom at min A/CGA on 4L O2. SpO2 WFL, Pt did get DOE 2/4. Pt frequency and dc updated to reflect needs - now recommending post-acute OT at <3 hour daily rehab to maximize safety and independence in ADL and functional transfers.     05/07/23 1131  OT Visit Information  Last OT Received On 05/07/23  Assistance Needed +1  History of Present Illness 67 y.o. male presents to [facility] 04/30/23 w/ acute L lower abdominal pain, L testicle pain, and h/a. Admitted w/ sepsis 2/2 acute perforated appendicitis s/p laparoscopic appendectomy 11/20. Echo w/ density in IVC, VQ scan negative. 11/24 developed Acute on chronic hypoxic respiratory failure, transferred to ICU. PMHx: AVM, chronic respiratory failure (2L O2 at home) CABG, CAD, CAP, CHF, COPD, CVA, HTN, A flutter, DM2.  Precautions  Precautions Fall  Precaution Comments abdominal surgery, open drain  abdomen, 2 bulb drains  Restrictions  Weight Bearing Restrictions No  Pain Assessment  Pain Assessment Faces  Faces Pain Scale 6  Pain Location abdomen incision  Pain Descriptors / Indicators Operative site guarding;Grimacing;Guarding  Pain Intervention(s) Monitored during session;Repositioned  Cognition  Arousal Alert (initially lethargic)  Behavior During Therapy WFL for tasks assessed/performed  Overall Cognitive Status No family/caregiver present to determine baseline cognitive functioning  Upper Extremity Assessment  Upper Extremity Assessment Generalized weakness  Lower Extremity Assessment  Lower Extremity Assessment Defer to PT evaluation  ADL  Overall ADL's  Needs assistance/impaired  Grooming Wash/dry hands;Contact guard assist;Standing  Grooming Details  (indicate cue type and reason) DOE, SpO2 stable on 4L  Toilet Transfer Contact guard assist;Ambulation;Rollator (4 wheels)  Toilet Transfer Details (indicate cue type and reason) into bathroom  Functional mobility during ADLs Contact guard assist;Rollator (4 wheels)  General ADL Comments Pt with very low activity tolerance  Bed Mobility  Overal bed mobility Needs Assistance  Bed Mobility Supine to Sit  Supine to sit HOB elevated;Used rails;Min assist  General bed mobility comments faciltated hips EOB with pad  Transfers  Overall transfer level Needs assistance  Equipment used Rollator (4 wheels)  Transfers Sit to/from Stand  Sit to Stand Min assist  General transfer comment Assist to power up and stabilize, especially from lower commode  Balance  Overall balance assessment Needs assistance  Sitting-balance support No upper extremity supported;Feet supported  Sitting balance-Leahy Scale Fair  Standing balance support Bilateral upper extremity supported;During functional activity;Reliant on assistive device for balance  Standing balance-Leahy Scale Poor  Standing balance comment BUE on Rollator and min guard for static standing  General Comments  General comments (skin integrity, edema, etc.) VSS on 4L O2, DOE 2/4  OT - End of Session  Equipment Utilized During Treatment Gait belt;Rollator (4 wheels);Oxygen (4L)  Activity Tolerance Patient tolerated treatment well  Patient left Other (comment) (on commode. PT ready)  Nurse Communication Mobility status;Precautions  OT Assessment/Plan  OT Visit Diagnosis Unsteadiness on feet (R26.81);Other abnormalities of gait and mobility (R26.89);Muscle weakness (generalized) (M62.81)  OT Frequency (ACUTE ONLY) Min 1X/week  Follow Up Recommendations Skilled nursing-short term rehab (<3 hours/day)  Patient can return home with the following A little help with bathing/dressing/bathroom;A little help with walking and/or transfers;Assistance with  cooking/housework;Assist for transportation;Help with stairs or ramp for entrance  OT Equipment Other (comment) (reacher, long handle bath sponge)  AM-PAC OT "6 Clicks" Daily Activity Outcome Measure (Version 2)  Help from another person eating meals? 4  Help from another person taking care of personal grooming? 3  Help from another person toileting, which includes using toliet, bedpan, or urinal? 3  Help from another person bathing (including washing, rinsing, drying)? 2  Help from another person to put on and taking off regular upper body clothing? 3  Help from another person to put on and taking off regular lower body clothing? 2  6 Click Score 17  Progressive Mobility  What is the highest level of mobility based on the progressive mobility assessment? Level 4 (Walks with assist in room) - Balance while marching in place and cannot step forward and back - Complete  Mobility Referral Yes  Activity Ambulated with assistance to bathroom  OT Goal Progression  Progress towards OT goals Progressing toward goals  Acute Rehab OT Goals  Patient Stated Goal "have a bowel movement"  OT Goal Formulation With patient  Time For Goal Achievement 05/18/23  Potential to Achieve Goals Good  OT Time Calculation  OT Start Time (ACUTE ONLY) 1136  OT Stop Time (ACUTE ONLY) 1158  OT Time Calculation (min) 22 min  OT General Charges  $OT Visit 1 Visit  OT Treatments  $Self Care/Home Management  8-22 mins   Nyoka Cowden OTR/L Acute Rehabilitation Services Office: [phone]

## 2023-05-07 NOTE — Progress Notes (Signed)
Patient ID: Jared [name], male   DOB: February 01, 1956, 67 y.o.   MRN: [id number] [facility] Surgery Progress Note  6 Days Post-Op  Subjective: CC-  About to get breathing treatment. No appetite. Tolerating liquids but not really eating otherwise. Denies n/v. No flatus or BM. He did get OOB to chair yesterday.  Objective: Vital signs in last 24 hours: Temp:  [98.2 F (36.8 C)-98.8 F (37.1 C)] 98.2 F (36.8 C) (11/26 0732) Pulse Rate:  [87-103] 87 (11/26 0600) Resp:  [12-25] 19 (11/26 0600) BP: (110-171)/(70-99) 154/79 (11/26 0600) SpO2:  [79 %-100 %] 98 % (11/26 0817) Last BM Date : 05/02/23  Intake/Output from previous day: 11/25 0701 - 11/26 0700 In: 446.7 [I.V.:436.7] Out: 645 [Urine:600; Drains:45] Intake/Output this shift: No intake/output data recorded.  PE: Gen:  Alert, NAD Card:  RRR Pulm:  rate and effort normal Abd: distended but soft, nontender, hypoactive bowel sounds, Dr x2 serosanguinous. Infraumbilical incision opened with healthy granulation tissue/ no erythema or purulent drainage  Lab Results:  Recent Labs    05/06/23 0221 05/07/23 0249  WBC 15.2* 14.1*  HGB 8.1* 8.9*  HCT 27.6* 30.9*  PLT 484* 528*   BMET Recent Labs    05/06/23 0221 05/07/23 0249  NA 135 135  K 3.7 3.9  CL 94* 92*  CO2 31 33*  GLUCOSE 111* 123*  BUN 11 8  CREATININE 1.01 0.90  CALCIUM 8.6* 8.6*   PT/INR Recent Labs    05/05/23 0848  LABPROT 13.7  INR 1.0   CMP     Component Value Date/Time   NA 135 05/07/2023 0249   NA 142 10/25/2022 1027   K 3.9 05/07/2023 0249   CL 92 (L) 05/07/2023 0249   CO2 33 (H) 05/07/2023 0249   GLUCOSE 123 (H) 05/07/2023 0249   BUN 8 05/07/2023 0249   BUN 14 10/25/2022 1027   CREATININE 0.90 05/07/2023 0249   CREATININE 1.20 07/30/2016 1431   CALCIUM 8.6 (L) 05/07/2023 0249   PROT 6.1 (L) 05/03/2023 1523   PROT 6.7 10/25/2022 1027   ALBUMIN 2.2 (L) 05/04/2023 0550   ALBUMIN 4.4 10/25/2022 1027   AST 36 05/03/2023 1523    ALT 16 05/03/2023 1523   ALKPHOS 100 05/03/2023 1523   BILITOT 0.5 05/03/2023 1523   BILITOT 0.3 10/25/2022 1027   GFRNONAA >60 05/07/2023 0249   GFRNONAA 64 07/30/2016 1431   GFRAA 91 01/29/2020 1020   GFRAA 74 07/30/2016 1431   Lipase     Component Value Date/Time   LIPASE 41 09/20/2021 1620       Studies/Results: DG CHEST PORT 1 VIEW  Result Date: 05/05/2023 CLINICAL DATA:  Shortness of breath. EXAM: PORTABLE CHEST 1 VIEW COMPARISON:  05/02/2023 FINDINGS: Right apex obscured by the patient's face. Vascular congestion with interstitial opacity at the bases, potentially dependent edema. No evidence for pneumothorax or focal consolidation. No substantial pleural effusion. Cardiopericardial silhouette is at upper limits of normal for size. Telemetry leads overlie the chest. IMPRESSION: Vascular congestion with interstitial opacity at the bases, potentially dependent edema. Electronically Signed   By: Kennith Center M.D.   On: 05/05/2023 14:09   CT GUIDED PERITONEAL/RETROPERITONEAL FLUID DRAIN BY PERC CATH  Result Date: 05/05/2023 INDICATION: 67 year old male status post appendectomy complicated postoperatively by pelvic fluid collection concerning for abscess. EXAM: CT PERC DRAIN PERITONEAL ABCESS COMPARISON:  CT abdomen pelvis from 05/04/2023 MEDICATIONS: The patient is currently admitted to the hospital and receiving intravenous antibiotics. The antibiotics were administered within an  appropriate time frame prior to the initiation of the procedure. ANESTHESIA/SEDATION: Moderate (conscious) sedation was employed during this procedure. A total of Versed mg and Fentanyl mcg was administered intravenously. Moderate Sedation Time: 10 minutes. The patient's level of consciousness and vital signs were monitored continuously by radiology nursing throughout the procedure under my direct supervision. CONTRAST:  None COMPLICATIONS: None immediate. PROCEDURE: RADIATION DOSE REDUCTION: This exam was  performed according to the departmental dose-optimization program which includes automated exposure control, adjustment of the mA and/or kV according to patient size and/or use of iterative reconstruction technique. Informed written consent was obtained from the patient and family member after a discussion of the risks, benefits and alternatives to treatment. The patient was placed prone on the CT gantry and a pre procedural CT was performed re-demonstrating the known abscess/fluid collection within the pelvis. The procedure was planned. A timeout was performed prior to the initiation of the procedure. The right gluteal region was prepped and draped in the usual sterile fashion. The overlying soft tissues were anesthetized with 1% lidocaine with epinephrine. Appropriate trajectory was planned with the use of a 22 gauge spinal needle. An 18 gauge trocar needle was advanced into the abscess/fluid collection and a short Amplatz super stiff wire was coiled within the collection. Appropriate positioning was confirmed with a limited CT scan. The tract was serially dilated allowing placement of a 8 [place] all-purpose drainage catheter. Appropriate positioning was confirmed with a limited postprocedural CT scan. Proximally 75 ml of purulent fluid was aspirated. The tube was connected to a bulb suction and sutured in place. A dressing was placed. The patient tolerated the procedure well without immediate post procedural complication. IMPRESSION: Successful CT guided placement of a right transgluteal 8 [place] all purpose drain catheter into the pelvic abscess with aspiration of approximately 75 mL of purulent fluid. Samples were sent to the laboratory as requested by the ordering clinical team. Marliss Coots, MD [facility] Electronically Signed   By: Marliss Coots M.D.   On: 05/05/2023 14:00    Anti-infectives: Anti-infectives (From admission, onward)    Start      Dose/Rate Route Frequency Ordered Stop   04/30/23 1930  piperacillin-tazobactam (ZOSYN) IVPB 3.375 g        3.375 g 12.5 mL/hr over 240 Minutes Intravenous Every 8 hours 04/30/23 1915 05/05/23 1940   04/30/23 1145  piperacillin-tazobactam (ZOSYN) IVPB 3.375 g        3.375 g 100 mL/hr over 30 Minutes Intravenous  Once 04/30/23 1131 04/30/23 1242        Assessment/Plan POD#6 - lap appy for perforated appendicitis 11/20 - MW - s/p IR drain placement 11/24, gram stain GPC, GNR, GPR. Culture pending. Continue IV zosyn and follow culture - continue surgical drain - WBC down 14.1, afebrile, monitor.  - AKI, creatinine improving - nephrology s/o - On soft diet but not really eating, he is tolerating liquids. Encourage protein shakes. Dulcolax suppository today. Continue miralax. - oob/pulm toilet; encouraged ambulation in halls. Continue therapies - rec SNF   FEN: soft ID: zosyn 11/19>> VTE: hold xarelto. SCDs. Hep gtt   Syncope COPD/Chronic resp failure - on 2 lpm at baseline HTN Atrial flutter - xarelto held, on heparin gtts H/o CVA PVD Tobacco use    LOS: 7 days    Franne Forts, [facility] Surgery 05/07/2023, 8:25 AM Please see Amion for pager number during day hours 7:00am-4:30pm

## 2023-05-07 NOTE — Progress Notes (Signed)
PHARMACY - ANTICOAGULATION CONSULT NOTE  Pharmacy Consult for heparin  Indication:  IVC thrombus , suspected and history of Afib and CVA  Allergies  Allergen Reactions   Lisinopril Cough    Patient Measurements: Height: 6\' 3"  (190.5 cm) Weight: 99.3 kg (219 lb) IBW/kg (Calculated) : 84.5 Heparin Dosing Weight: 99.3 kg   Vital Signs: Temp: 98.2 F (36.8 C) (11/26 0732) Temp Source: Oral (11/26 0732) BP: 154/79 (11/26 0600) Pulse Rate: 87 (11/26 0600)  Labs: Recent Labs    05/05/23 1610 05/05/23 0848 05/05/23 2322 05/06/23 0221 05/07/23 0249  HGB 8.3*  --   --  8.1* 8.9*  HCT 28.5*  --   --  27.6* 30.9*  PLT 440*  --   --  484* 528*  LABPROT  --  13.7  --   --   --   INR  --  1.0  --   --   --   HEPARINUNFRC 0.49  --  0.52 0.40 0.40  CREATININE  --  1.07  --  1.01 0.90    Estimated Creatinine Clearance: 92.6 mL/min (by C-G formula based on SCr of 0.9 mg/dL).  Assessment: 67 yo M with suspected PE from ECHO findings. V/Q scan neg for PE. Vague density in the IVC. Cannot rule out thrombus. Pt is s/p appendectomy with drain placement on 11/20. Pt is s/p transgluteal pelvic drain placement in IR on 11/24.  Heparin level 0.4, therapeutic, on heparin 1900 units/hr Hgb 8.9, Plts up 528  Cole, RN reports that abdominal incision continuing to ooze, but serous, and not bloody. No issues with heparin gtt reported from nursing.   Goal of Therapy:  Heparin level 0.3-0.7 units/ml Monitor platelets by anticoagulation protocol: Yes   Plan:  Continue heparin infusion to 1900 units/hr Monitor daily CBC, heparin level, and for s/sx of bleeding Follow- up on plan to transition to oral [facility]   Thank you for allowing pharmacy to be a part of this patient's care.  Link Snuffer, PharmD, BCPS, BCCCP Please refer to [facility] for [facility] numbers 05/07/2023 8:52 AM

## 2023-05-07 NOTE — Progress Notes (Signed)
Patient Name: Jared [name] Date of Encounter: 05/07/2023 [facility] Cardiologist: Dietrich Pates, MD   Interval Summary  .    67 yr old male with PMH of mild CAD, HFrEF with improved LVEF, HTN, PAD, HLD, paroxysmal atrial flutter on Xarleto, CVA, oxygen dependent COPD, active tobacco use.   He was admitted 11/19 for sepsis due to acute perforated appendicitis with microperforation, s/p lap appy 11/20, course complicated by AKI, A fib RVR.   Complains of SOB and wheezing despite getting nebs recently  Tele reviewed and shows no afib.  Short burst of PAT and frequent PACs  Vital Signs .    Vitals:   05/07/23 0817 05/07/23 0900 05/07/23 0919 05/07/23 1000  BP:  (!) 153/98  124/84  Pulse:  92 93 (!) 101  Resp:  15 17 19   Temp:      TempSrc:      SpO2: 98% 98% 99% 98%  Weight:      Height:        Intake/Output Summary (Last 24 hours) at 05/07/2023 1042 Last data filed at 05/07/2023 0600 Gross per 24 hour  Intake 389.62 ml  Output 645 ml  Net -255.38 ml      05/01/2023    1:34 PM 04/30/2023   11:14 PM 04/24/2023   10:40 AM  Last 3 Weights  Weight (lbs) 219 lb 219 lb 2.2 oz 219 lb 12.8 oz  Weight (kg) 99.338 kg 99.4 kg 99.701 kg      Telemetry/ECG    SR, ST, HR generally high-nl, short bursts of tachycardia w/ no sx - Personally Reviewed  ECHO: 05/02/2023  1. Left ventricular ejection fraction, by estimation, is 55 to 60%. The left ventricle has normal function. The left ventricle has no regional wall motion abnormalities. Left ventricular diastolic parameters are consistent with Grade II diastolic dysfunction (pseudonormalization).   2. Right ventricular systolic function is moderately reduced. The right ventricular size is moderately enlarged. Tricuspid regurgitation signal is inadequate for assessing PA pressure.   3. Left atrial size was mildly dilated.   4. The mitral valve is degenerative. No evidence of mitral valve  regurgitation. No evidence of  mitral stenosis.   5. The aortic valve is tricuspid. Aortic valve regurgitation is not  visualized. Aortic valve sclerosis/calcification is present, without any evidence of aortic stenosis.   6. There is a vague density in the IVC. Cannot rule out thrombus.   Recommend dedicated Abdominal CTA vs. Abdominal US for further evaluation.  The inferior vena cava is dilated in size with >50% respiratory  variability, suggesting right atrial pressure of  8 mmHg.   7. In setting of syncope as well as RV dysfunction, consider acute PE.  Recommend Chest CTA if clinically indicated.    Physical Exam .    GEN: Well nourished, well developed in no acute distress HEENT: Normal NECK: No JVD; No carotid bruits LYMPHATICS: No lymphadenopathy CARDIAC:RRR, no murmurs, rubs, gallops RESPIRATORY:  expiratory wheezes throughout both lung fields ABDOMEN: Soft, non-tender, non-distended MUSCULOSKELETAL:  No edema; No deformity  SKIN: Warm and dry NEUROLOGIC:  Alert and oriented x 3 PSYCHIATRIC:  Normal affect  Assessment & Plan .     Paroxysmal A fib/flutter with RVR - no further afib.  Mainly frequent PACs and short burst of PAT - PTA Xarelto held >> Now on heparin  - continue Toprol XL 50mg  daily>> feel elevated rate mainly due to acute illness.  - up-tritiate if needed, although would treat AKI/infection first  -  transition to DOAC when ok with surgery  HFrEF with improved LVEF RV dysfunction new this admission  - Echo 11/21 showed LVEF improved to 55-60% , no RWMA, grade II DD, RV mod reduced, RV mod enlarged, mild LAE, There is a vague density in the IVC. Cannot rule out thrombus.  - VQ scan negative for PE  - He does have a history of COPD severe restrictive lung disease and severe COPD noted on PFTs in 2021 followed by pulmonary - Suspect primary lung disease contributing to right-sided heart failure.  There was no TR jet to determine PA pressures. - Has never had a sleep study so would  recommend outpatient PSG once he is discharged home - he does not appear volume overloaded on exam today but with increased wheezing that does not appear to have improved with nebs, need to consider cardiac wheezing>>Cxray with improvement in prior vascular congestion c/w resolving edema; BNP mildly elevated at 203 - will give a dose of Lasix 40mg  IV -GDMT:  Continue Toprol XL 50mg  daily, Imdur 30mg  daily, Hydralazine 50mg  q8hours  Nonobstructive CAD -Cath in 2012 -Ischemia on Myoview 2016 -Not had any anginal symptoms -Aspirin 81 mg daily, atorvastatin 80 mg daily, Zetia 10 mg daily, Toprol XL 50 mg daily  Syncope and collapse - Patient reports 2 years of intermittent generalized body shaking with syncope and loss of consciousness that are often positional when going from sitting to standing. - Last echocardiogram was 12/2021 with EF of 45 to 50% and global hypokinesis, grade 1 diastolic dysfunction.  No significant valvular abnormality. - Repeat echo shows preserved EF, grade 2 diastolic dysfunction with moderate reduction in RV function and RV dilatation and a vague density in the IVC, concerning for possible PE/IVC thrombus - VQ scan negative for PE  - Abdominal US ordered to rule out IVC thrombus but was non-diagnostic due to body habitus and air in abdominal cavity - DVT US negative - 21 beat run of NSVT on 11/23 - no further NSVT - may need to consider ILR placement due to episodes of syncope once he recovers from acute illness   Perforated appendicitis  AKI Anemia  Syncope  Intermittent involuntary movement of arms and legs  COPD - requested RT give an extra treatment Hx of CVA - per primary team    I have personally spent 40 minutes involved in face-to-face and non-face-to-face activities for this patient on the day of the visit. Professional time spent includes the following activities: Preparing to see the patient (review of tests), Obtaining and/or reviewing separately  obtained history (admission/discharge record), Performing a medically appropriate examination and/or evaluation , Ordering medications/tests/procedures, referring and communicating with other health care professionals, Documenting clinical information in the EMR, Independently interpreting results (not separately reported), Communicating results to the patient/family/caregiver, Counseling and educating the patient/family/caregiver and Care coordination (not separately reported).   For questions or updates, please contact [facility] Please consult [email or web address] for contact info under        Signed, Armanda Magic, MD

## 2023-05-07 NOTE — Progress Notes (Signed)
Physical Therapy Treatment Patient Details Name: Jared [name] MRN: [id number] DOB: 1956-03-01 Today's Date: 05/07/2023   History of Present Illness 67 y.o. male presents to [facility] 04/30/23 w/ acute L lower abdominal pain, L testicle pain, and h/a. Admitted w/ sepsis 2/2 acute perforated appendicitis s/p laparoscopic appendectomy 11/20. Echo w/ density in IVC, VQ scan negative. 11/24 developed Acute on chronic hypoxic respiratory failure, transferred to ICU. PMHx: AVM, chronic respiratory failure (2L O2 at home) CABG, CAD, CAP, CHF, COPD, CVA, HTN, A flutter, DM2.    PT Comments  Pt making steady progress with mobility and able to amb in hallway with assist today. Pt on 4L O2 and with dyspnea but SpO2 93%. Pt lives alone and will benefit from continued inpatient follow up therapy, <3 hours/day     If plan is discharge home, recommend the following: A little help with walking and/or transfers;A little help with bathing/dressing/bathroom;Assistance with cooking/housework;Assist for transportation;Help with stairs or ramp for entrance   Can travel by private vehicle     Yes  Equipment Recommendations  None recommended by PT    Recommendations for Other Services       Precautions / Restrictions Precautions Precautions: Fall Precaution Comments: abdominal surgery, jp drains, open drain  abdomen Restrictions Weight Bearing Restrictions: No     Mobility  Bed Mobility               General bed mobility comments: Pt on commode    Transfers Overall transfer level: Needs assistance Equipment used: Rolling walker (2 wheels) Transfers: Sit to/from Stand Sit to Stand: Min assist           General transfer comment: Assist to power up and stabilize    Ambulation/Gait Ambulation/Gait assistance: Min assist, +2 safety/equipment Gait Distance (Feet): 60 Feet Assistive device: Rolling walker (2 wheels) Gait Pattern/deviations: Step-through pattern, Decreased stride length, Trunk  flexed Gait velocity: decr Gait velocity interpretation: <1.8 ft/sec, indicate of risk for recurrent falls   General Gait Details: Assist for balance and suppport and 2nd person for chair follow   Stairs             Wheelchair Mobility     Tilt Bed    Modified Rankin (Stroke Patients Only)       Balance Overall balance assessment: Needs assistance Sitting-balance support: No upper extremity supported, Feet supported Sitting balance-Leahy Scale: Fair     Standing balance support: Bilateral upper extremity supported, During functional activity, Reliant on assistive device for balance Standing balance-Leahy Scale: Poor Standing balance comment: walker and min guard for static standnig                            Cognition Arousal: Alert (initially lethargic but once awake maintained alertness) Behavior During Therapy: WFL for tasks assessed/performed Overall Cognitive Status: No family/caregiver present to determine baseline cognitive functioning                                          Exercises      General Comments General comments (skin integrity, edema, etc.): VSS on 4L. SpO2 93% after amb on 4L      Pertinent Vitals/Pain Pain Assessment Pain Assessment: Faces Faces Pain Scale: Hurts little more Pain Location: abdomen Pain Descriptors / Indicators: Grimacing, Operative site guarding Pain Intervention(s): Monitored during session, Limited activity within patient's tolerance,  Repositioned    Home Living                          Prior Function            PT Goals (current goals can now be found in the care plan section) Acute Rehab PT Goals Patient Stated Goal: to go home Progress towards PT goals: Progressing toward goals    Frequency    Min 1X/week      PT Plan      Co-evaluation   Reason for Co-Treatment: Other (comment);For patient/therapist safety (activity tolerance, sycopal episode  history) PT goals addressed during session: Mobility/safety with mobility;Balance;Proper use of DME OT goals addressed during session: ADL's and self-care;Strengthening/ROM;Proper use of Adaptive equipment and DME      AM-PAC PT "6 Clicks" Mobility   Outcome Measure  Help needed turning from your back to your side while in a flat bed without using bedrails?: A Little Help needed moving from lying on your back to sitting on the side of a flat bed without using bedrails?: A Lot Help needed moving to and from a bed to a chair (including a wheelchair)?: A Little Help needed standing up from a chair using your arms (e.g., wheelchair or bedside chair)?: A Little Help needed to walk in hospital room?: A Little Help needed climbing 3-5 steps with a railing? : Total 6 Click Score: 15    End of Session Equipment Utilized During Treatment: Oxygen Activity Tolerance: Patient tolerated treatment well Patient left: in chair;with call bell/phone within reach;with chair alarm set Nurse Communication: Mobility status PT Visit Diagnosis: Unsteadiness on feet (R26.81);Repeated falls (R29.6);Other abnormalities of gait and mobility (R26.89);Muscle weakness (generalized) (M62.81)     Time: 8295-6213 PT Time Calculation (min) (ACUTE ONLY): 19 min  Charges:    $Gait Training: 8-22 mins PT General Charges $$ ACUTE PT VISIT: 1 Visit                     [facility] PT Acute Rehabilitation Services Office [phone]    Angelina Ok [facility] 05/07/2023, 12:40 PM

## 2023-05-07 NOTE — Progress Notes (Signed)
Mobility Specialist Progress Note:   05/07/23 1430  Mobility  Activity Transferred from chair to bed  Level of Assistance Minimal assist, patient does 75% or more  Assistive Device Front wheel walker  Distance Ambulated (ft) 3 ft  Activity Response Tolerated well  Mobility Referral Yes  $Mobility charge 1 Mobility  Mobility Specialist Start Time (ACUTE ONLY) 1430  Mobility Specialist Stop Time (ACUTE ONLY) 1440  Mobility Specialist Time Calculation (min) (ACUTE ONLY) 10 min   Pt requesting to transfer back to bed. Required minA to stand and pivot with RW. No c/o throughout, pt back in bed with all needs met. Alarm on.  Addison Lank Mobility Specialist Please contact via SecureChat or  Rehab office at [phone]

## 2023-05-07 NOTE — Progress Notes (Signed)
Progress Note   Patient: Jared [name] QMV:[id number] DOB: [birth year] DOA: 04/30/2023     7 DOS: the patient was seen and examined on 05/07/2023   Brief hospital course: 67yo with h/o chronic combined CHF, HTN, CVA, afib on Xarelto, PVD, and COPD on 2L home O2 who presented on 11/19 with SOB and other complaints.  Fever 100.9, WBC 13.4, lactate 2.9, Creatinine 1.65 with AKI.  CT A?P with acute perforated appendicitis with microperforation.  Admitted for IV antibiotics, Xarelto washout.  Surgery is consulting.  Underwent lap appy on 11/20 but slow recovery.  Repeat CT with abscess and concern for peritonitis.  Underwent transgluteal pelvic drain placement on 11/24 with unresponsiveness/respiratory distress after procedure which resolved.  Assessment and Plan:  Sepsis due to acute perforated appendicitis with microperforation Surgery consulted and underwent lap appy on 11/20 - visible appendiceal perforation noted with abscess that was drained and fecaliths removed; surgical drain was left in place Started on IV Zosyn Blood cultures negative x 5 days from admission Abdomen remains sore and distended with drain in LLQ Worsening leukocytosis with high risk of complications, repeat CT performed with deep abscess appreciated,, concern for peritonitis on 11/24 Went to IR for transgluteal pelvic drain placement, 75cc aspirate, sent for culture on 11/24 Unresponsiveness post-procedure, slowly improving - transferred to ICU at this time for acute on chronic respiratory failure (see below) Wound culture pending Continued on Zosyn for now   Acute on chronic hypoxic respiratory failure Event post-procedure following prone positioning for drain placement Required NRB O2 Monitored overnight in ICU Chronic respiratory failure is secondary to COPD on 2L, currently on 4L with ongoing wheezing despite nebs PRN duoneb, Albuterol Continue Breztri   Delirium Patient has had periodic delirium during the  hospitalization with concern for seizures at one point but negative LTM EEG Currently with reported AV hallucinations Suspect this is multifactorial and related to ICU psychosis in conjunction with polypharmacy and infection Will add prn IV Haldol and delirium precautions Neurology previously consulted Recommend NOT increasing Oxy or gabapentin   Syncope and collapse Patient reports 2 years of intermittent generalized body shaking with syncope and loss of consciousness that are often positional when going from sitting to standing. Last echocardiogram was 12/2021 with EF of 45 to 50% and global hypokinesis, grade 1 diastolic dysfunction.  No significant valvular abnormality. Repeat echo shows preserved EF, grade 2 diastolic dysfunction with moderate reduction in RV function and RV dilatation and a vague density in the IVC, concerning for possible PE/IVC thrombus STAT VQ scan negative for PE  Abdominal US ordered to rule out IVC thrombus but was non-diagnostic due to body habitus and air in abdominal cavity DVT US negative Cardiology consulting Will continue to treat empirically with Heparin for now 21 beat run of NSVT on 11/23 Repeat CXR today shows improvement and BNP is also improved, unlikely cardiogenic wheezing   IDA Patient with h/o iron deficiency anemia, possibly with superimposed ABLA from surgery Receives weekly IV iron infusions, given on 11/21  Hgb improved post-iron, appears to be stable   Essential hypertension Continue hydralazine, Imdur, metoprolol Held Lasix due to recurrent AKI - will add back   HLD Continue Zetia, atorvastatin   Atrial flutter  Continue Toprol XL Holding Xarelto due to surgery Started Heparin infusion on 11/21 and this is ongoing given ?IVC thrombus   Chronic diastolic CHF Echo 11/21 with preserved EF and grade 2 diastolic dysfunction Concern for IVC thrombus   History of CVA (cerebrovascular accident) Continue aspirin  Xarelto currently on  hold    Peripheral vascular disease  Continue aspirin, statin   Depression Continue Cymbalta, gabapentin   Tobacco use Ongoing use of close to 1 pack/day Nicotine patch offered   Malnutrition Nutrition Problem: Moderate Malnutrition Etiology: acute illness Signs/Symptoms: energy intake < 75% for > 7 days, mild muscle depletion, mild fat depletion Interventions: MVI         Consultants: Surgery Cardiology Neurology PCCM IR Nutrition PT OT   Procedures: Appendectomy 11/20 Transgluteal pelvic drain placement 11/24   Antibiotics: Zosyn 11/19-    30 Day Unplanned Readmission Risk Score    Flowsheet Row ED to Hosp-Admission (Current) from 04/30/2023 in [place] [facility]  30 Day Unplanned Readmission Risk Score (%) 21.96 Filed at 05/07/2023 0401       This score is the patient's risk of an unplanned readmission within 30 days of being discharged (0 -100%). The score is based on dignosis, age, lab data, medications, orders, and past utilization.   Low:  0-14.9   Medium: 15-21.9   High: 22-29.9   Extreme: 30 and above           Subjective: Improved cognition today, reports that he is feeling some better.  Increased wheezing despite nebs.   Objective: Vitals:   05/07/23 1131 05/07/23 1423  BP: (!) 170/88   Pulse: 93   Resp: 17 18  Temp: 98.2 F (36.8 C)   SpO2: 100%     Intake/Output Summary (Last 24 hours) at 05/07/2023 1452 Last data filed at 05/07/2023 1100 Gross per 24 hour  Intake 429.72 ml  Output 645 ml  Net -215.28 ml   Filed Weights   04/30/23 2314 05/01/23 1334  Weight: 99.4 kg 99.3 kg    Exam:  General:  Appears more alert and interactive today Eyes:  EOMI, normal lids, iris ENT:  grossly normal hearing, lips & tongue, mmm Neck:  no LAD, masses or thyromegaly Cardiovascular:  RRR, no m/r/g. No LE edema.  Respiratory:   Diffuse wheezing despite nebs.  Mildly increased respiratory effort, on 4L [place] O2 Abdomen:   still distended but not as TTP today, ongoing serosanguinous output from drains Skin:  no rash or induration seen on limited exam Musculoskeletal:  no bony abnormality Psychiatric:  blunted affect, cognitive slowing noted  Data Reviewed: I have reviewed the patient's lab results since admission.  Pertinent labs for today include:   CO2 33 Glucose 123 WBC 14.1 Hgb 8.9 Platelets 528     Family Communication: None present today  Disposition: Status is: Inpatient Remains inpatient appropriate because: ongoing management     Time spent: 50 minutes  Unresulted Labs (From admission, onward)     Start     Ordered   05/08/23 0500  CBC with Differential/Platelet  Tomorrow morning,   R       Question:  Specimen collection method  Answer:  Lab=Lab collect   05/07/23 1452   05/08/23 0500  Basic metabolic panel  Tomorrow morning,   R       Question:  Specimen collection method  Answer:  Lab=Lab collect   05/07/23 1452   05/06/23 0500  Heparin level (unfractionated)  Daily,   R      05/05/23 0140             Author: Jonah Blue, MD 05/07/2023 2:52 PM  For on call review [email or web address].

## 2023-05-08 ENCOUNTER — Ambulatory Visit: Payer: Medicare HMO | Admitting: Family Medicine

## 2023-05-08 ENCOUNTER — Ambulatory Visit: Payer: Medicare HMO

## 2023-05-08 DIAGNOSIS — K3532 Acute appendicitis with perforation and localized peritonitis, without abscess: Secondary | ICD-10-CM | POA: Diagnosis not present

## 2023-05-08 DIAGNOSIS — K35201 Acute appendicitis with generalized peritonitis, with perforation, without abscess: Secondary | ICD-10-CM | POA: Diagnosis not present

## 2023-05-08 DIAGNOSIS — R55 Syncope and collapse: Secondary | ICD-10-CM | POA: Diagnosis not present

## 2023-05-08 LAB — CBC WITH DIFFERENTIAL/PLATELET
Abs Immature Granulocytes: 0.57 10*3/uL — ABNORMAL HIGH (ref 0.00–0.07)
Basophils Absolute: 0.1 10*3/uL (ref 0.0–0.1)
Basophils Relative: 1 %
Eosinophils Absolute: 0.3 10*3/uL (ref 0.0–0.5)
Eosinophils Relative: 2 %
HCT: 30.8 % — ABNORMAL LOW (ref 39.0–52.0)
Hemoglobin: 8.7 g/dL — ABNORMAL LOW (ref 13.0–17.0)
Immature Granulocytes: 5 %
Lymphocytes Relative: 14 %
Lymphs Abs: 1.6 10*3/uL (ref 0.7–4.0)
MCH: 25.4 pg — ABNORMAL LOW (ref 26.0–34.0)
MCHC: 28.2 g/dL — ABNORMAL LOW (ref 30.0–36.0)
MCV: 89.8 fL (ref 80.0–100.0)
Monocytes Absolute: 0.9 10*3/uL (ref 0.1–1.0)
Monocytes Relative: 8 %
Neutro Abs: 7.8 10*3/uL — ABNORMAL HIGH (ref 1.7–7.7)
Neutrophils Relative %: 70 %
Platelets: 524 10*3/uL — ABNORMAL HIGH (ref 150–400)
RBC: 3.43 MIL/uL — ABNORMAL LOW (ref 4.22–5.81)
RDW: 17.4 % — ABNORMAL HIGH (ref 11.5–15.5)
WBC: 11.2 10*3/uL — ABNORMAL HIGH (ref 4.0–10.5)
nRBC: 0.3 % — ABNORMAL HIGH (ref 0.0–0.2)

## 2023-05-08 LAB — BASIC METABOLIC PANEL
Anion gap: 12 (ref 5–15)
BUN: 8 mg/dL (ref 8–23)
CO2: 32 mmol/L (ref 22–32)
Calcium: 8.4 mg/dL — ABNORMAL LOW (ref 8.9–10.3)
Chloride: 93 mmol/L — ABNORMAL LOW (ref 98–111)
Creatinine, Ser: 0.86 mg/dL (ref 0.61–1.24)
GFR, Estimated: >60 mL/min (ref >60)
Glucose, Bld: 93 mg/dL (ref 70–99)
Potassium: 3.9 mmol/L (ref 3.5–5.1)
Sodium: 137 mmol/L (ref 135–145)

## 2023-05-08 LAB — GLUCOSE, CAPILLARY: Glucose-Capillary: 130 mg/dL — ABNORMAL HIGH (ref 70–99)

## 2023-05-08 LAB — HEPARIN LEVEL (UNFRACTIONATED): Heparin Unfractionated: 0.43 [IU]/mL (ref 0.30–0.70)

## 2023-05-08 MED ORDER — POLYETHYLENE GLYCOL 3350 17 G PO PACK
17.0000 g | PACK | Freq: Two times a day (BID) | ORAL | Status: DC
  Administered 2023-05-11 – 2023-05-15 (×3): 17 g via ORAL
  Filled 2023-05-08 (×18): qty 1

## 2023-05-08 MED ORDER — BISACODYL 10 MG RE SUPP: 10.0000 mg | Freq: Every day | RECTAL | Status: DC | PRN

## 2023-05-08 NOTE — Plan of Care (Signed)
  Problem: Health Behavior/Discharge Planning: Goal: Ability to manage health-related needs will improve Outcome: Progressing   Problem: Nutrition: Goal: Adequate nutrition will be maintained Outcome: Progressing   Problem: Pain Management: Goal: General experience of comfort will improve Outcome: Progressing   Problem: Safety: Goal: Ability to remain free from injury will improve Outcome: Progressing

## 2023-05-08 NOTE — Progress Notes (Signed)
Progress Note   Patient: Jared [name] JXB:[id number] DOB: 06-14-1955 DOA: 04/30/2023     8 DOS: the patient was seen and examined on 05/08/2023   Brief hospital course: 67yo with h/o chronic combined CHF, HTN, CVA, afib on Xarelto, PVD, and COPD on 2L home O2 who presented on 11/19 with SOB and other complaints.  Fever 100.9, WBC 13.4, lactate 2.9, Creatinine 1.65 with AKI.  CT A?P with acute perforated appendicitis with microperforation.  Admitted for IV antibiotics, Xarelto washout.  Surgery is consulting.  Underwent lap appy on 11/20 but slow recovery.  Repeat CT with abscess and concern for peritonitis.  Underwent transgluteal pelvic drain placement on 11/24 with unresponsiveness/respiratory distress after procedure which resolved.  Assessment and Plan:  Sepsis due to acute perforated appendicitis with microperforation - lap appy on 11/20 - visible appendiceal perforation noted with abscess that was drained and fecaliths removed; surgical drain was left in place Started on IV Zosyn Blood cultures negative x 5 days from admission Abdomen remains sore and distended with drain in LLQ Worsening leukocytosis with high risk of complications, repeat CT performed with deep abscess appreciated,, concern for peritonitis on 11/24 Went to IR for transgluteal pelvic drain placement, drain management by IR White blood cell count trending down  Acute on chronic hypoxic respiratory failure Event post-procedure following prone positioning for drain placement Required NRB O2 Monitored overnight in ICU Chronic respiratory failure is secondary to COPD on 2L, currently on 4L with ongoing wheezing despite nebs PRN duoneb, Albuterol Continue Breztri   Delirium Patient has had periodic delirium during the hospitalization with concern for seizures at one point but negative LTM EEG Currently with reported AV hallucinations Suspect this is multifactorial and related to ICU psychosis in conjunction with  polypharmacy and infection Will add prn IV Haldol and delirium precautions Neurology previously consulted Recommend NOT increasing Oxy or gabapentin   Syncope and collapse Patient reports 2 years of intermittent generalized body shaking with syncope and loss of consciousness that are often positional when going from sitting to standing. Last echocardiogram was 12/2021 with EF of 45 to 50% and global hypokinesis, grade 1 diastolic dysfunction.  No significant valvular abnormality. Repeat echo shows preserved EF, grade 2 diastolic dysfunction with moderate reduction in RV function and RV dilatation and a vague density in the IVC, concerning for possible PE/IVC thrombus STAT VQ scan negative for PE  Abdominal US ordered to rule out IVC thrombus but was non-diagnostic due to body habitus and air in abdominal cavity DVT US negative Cardiology consulting Will continue to treat empirically with Heparin for now 21 beat run of NSVT on 11/23 Repeat CXR today shows improvement and BNP is also improved, unlikely cardiogenic wheezing   IDA Patient with h/o iron deficiency anemia, possibly with superimposed ABLA from surgery Receives weekly IV iron infusions, given on 11/21  Hgb improved post-iron, appears to be stable   Essential hypertension Continue hydralazine, Imdur, metoprolol Held Lasix due to recurrent AKI - will add back   HLD Continue Zetia, atorvastatin  HFrEF with improved LVEF RV dysfunction new this admission  - Echo 11/21 showed LVEF improved to 55-60% , no RWMA, grade II DD, RV mod reduced, RV mod enlarged, mild LAE, There is a vague density in the IVC. Cannot rule out thrombus.  -Cardiology input greatly appreciated, diuresis per cardiology -Will need sleep study as an outpatient. -Continue with Toprol-XL, Imdur and hydralazine   Atrial flutter  Continue Toprol XL Holding Xarelto due to surgery, now on  IV heparin.   Chronic diastolic CHF Echo 11/21 with preserved EF and  grade 2 diastolic dysfunction Concern for IVC thrombus   History of CVA (cerebrovascular accident) Continue aspirin Xarelto currently on hold    Peripheral vascular disease  Continue aspirin, statin   Depression Continue Cymbalta, gabapentin   Tobacco use Ongoing use of close to 1 pack/day Nicotine patch offered   Malnutrition Nutrition Problem: Moderate Malnutrition Etiology: acute illness Signs/Symptoms: energy intake < 75% for > 7 days, mild muscle depletion, mild fat depletion Interventions: MVI         Consultants: Surgery Cardiology Neurology PCCM IR Nutrition PT OT   Procedures: Appendectomy 11/20 Transgluteal pelvic drain placement 11/24   Antibiotics: Zosyn 11/19-    30 Day Unplanned Readmission Risk Score    Flowsheet Row ED to Hosp-Admission (Current) from 04/30/2023 in [place] [facility]  30 Day Unplanned Readmission Risk Score (%) 21.96 Filed at 05/07/2023 0401       This score is the patient's risk of an unplanned readmission within 30 days of being discharged (0 -100%). The score is based on dignosis, age, lab data, medications, orders, and past utilization.   Low:  0-14.9   Medium: 15-21.9   High: 22-29.9   Extreme: 30 and above           Subjective: Patient reports generalized weakness, fatigue, has poor appetite as discussed with staff, had BM x 1 yesterday   Objective: Vitals:   05/08/23 0852 05/08/23 1200  BP: (!) 144/81   Pulse: 85 92  Resp: 17   Temp:    SpO2:      Intake/Output Summary (Last 24 hours) at 05/08/2023 1456 Last data filed at 05/08/2023 1327 Gross per 24 hour  Intake 5 ml  Output 560 ml  Net -555 ml   Filed Weights   04/30/23 2314 05/01/23 1334  Weight: 99.4 kg 99.3 kg    Exam:   Awake Alert, Oriented X 3, appearing Symmetrical Chest wall movement, Good air movement bilaterally, CTAB RRR,No Gallops,Rubs or new Murmurs, No Parasternal Heave +ve B.Sounds, given mildly distended,  JP drain presents anteriorly, IR drain presents posteriorly. No Cyanosis, Clubbing or edema, No new Rash or bruise     Data Reviewed: I have reviewed the patient's lab results since admission.  Pertinent labs for today include:   CO2 33 Glucose 123 WBC 14.1 Hgb 8.9 Platelets 528     Family Communication: None present today  Disposition: Status is: Inpatient Remains inpatient appropriate because: ongoing management      Unresulted Labs (From admission, onward)     Start     Ordered   05/06/23 0500  Heparin level (unfractionated)  Daily,   R      05/05/23 0140             Author: Huey Bienenstock, MD 05/08/2023 2:56 PM  For on call review [email or web address].

## 2023-05-08 NOTE — Progress Notes (Signed)
7 Days Post-Op   Subjective/Chief Complaint:  Pt doing well Had BM x 1 yesterday   Objective: Vital signs in last 24 hours: Temp:  [97.8 F (36.6 C)-98.2 F (36.8 C)] 98 F (36.7 C) (11/27 0400) Pulse Rate:  [84-101] 95 (11/27 0400) Resp:  [13-19] 13 (11/27 0400) BP: (124-170)/(73-98) 137/81 (11/27 0400) SpO2:  [95 %-100 %] 95 % (11/26 2030) Last BM Date : 05/07/23 (patient reported)  Intake/Output from previous day: 11/26 0701 - 11/27 0700 In: 97.1 [I.V.:75.8; IV Piggyback:21.3] Out: 560 [Urine:500; Drains:60] Intake/Output this shift: No intake/output data recorded. PE:  Constitutional: No acute distress, conversant, appears states age. Eyes: Anicteric sclerae, moist conjunctiva, no lid lag Lungs: Clear to auscultation bilaterally, normal respiratory effort CV: regular rate and rhythm, no murmurs, no peripheral edema, pedal pulses 2+ GI: Soft, no masses or hepatosplenomegaly, non-tender to palpation, Dr ss Skin: No rashes, palpation reveals normal turgor Psychiatric: appropriate judgment and insight, oriented to person, place, and time   Lab Results:  Recent Labs    05/07/23 0249 05/08/23 0448  WBC 14.1* 11.2*  HGB 8.9* 8.7*  HCT 30.9* 30.8*  PLT 528* 524*   BMET Recent Labs    05/07/23 0249 05/08/23 0448  NA 135 137  K 3.9 3.9  CL 92* 93*  CO2 33* 32  GLUCOSE 123* 93  BUN 8 8  CREATININE 0.90 0.86  CALCIUM 8.6* 8.4*   PT/INR No results for input(s): "LABPROT", "INR" in the last 72 hours. ABG Recent Labs    05/05/23 1350  PHART 7.27*  HCO3 36.3*    Studies/Results: DG CHEST PORT 1 VIEW  Result Date: 05/07/2023 CLINICAL DATA:  Wheezing, nasal congestion EXAM: PORTABLE CHEST 1 VIEW COMPARISON:  05/05/2023 FINDINGS: 2 frontal views of the chest demonstrate a stable cardiac silhouette. No acute airspace disease, effusion, or pneumothorax. Improved interstitial prominence at the lung bases, with residual densities at the right lung base.  Persistent central pulmonary vascular congestion. No acute bony abnormalities. IMPRESSION: 1. Persistent vascular congestion, with marked improvement in bibasilar interstitial prominence seen previously consistent with resolving interstitial edema. Electronically Signed   By: Sharlet Salina M.D.   On: 05/07/2023 09:22    Anti-infectives: Anti-infectives (From admission, onward)    Start     Dose/Rate Route Frequency Ordered Stop   05/07/23 0945  piperacillin-tazobactam (ZOSYN) IVPB 3.375 g        3.375 g 12.5 mL/hr over 240 Minutes Intravenous Every 8 hours 05/07/23 0851     05/07/23 0930  piperacillin-tazobactam (ZOSYN) IVPB 3.375 g  Status:  Discontinued        3.375 g 100 mL/hr over 30 Minutes Intravenous Every 8 hours 05/07/23 0836 05/07/23 0851   04/30/23 1930  piperacillin-tazobactam (ZOSYN) IVPB 3.375 g        3.375 g 12.5 mL/hr over 240 Minutes Intravenous Every 8 hours 04/30/23 1915 05/05/23 1940   04/30/23 1145  piperacillin-tazobactam (ZOSYN) IVPB 3.375 g        3.375 g 100 mL/hr over 30 Minutes Intravenous  Once 04/30/23 1131 04/30/23 1242       Assessment/Plan: POD#7 - lap appy for perforated appendicitis 11/20 - MW - s/p IR drain placement 11/24, gram stain GPC, GNR, GPR. Culture pending. Continue IV zosyn and follow culture - continue surgical drain - WBC down 11.2 afebrile, monitor.  - AKI, creatinine improving - nephrology s/o - On soft diet but not really eating, he is tolerating liquids. Encourage protein shakes. Dulcolax suppository today. Continue miralax. -  oob/pulm toilet; encouraged ambulation in halls. Continue therapies - rec SNF   FEN: soft, increase bowel regimen ID: [email or web address] 11/19>> VTE: hold xarelto. SCDs. Hep gtt   Syncope COPD/Chronic resp failure - on 2 lpm at baseline HTN Atrial flutter - xarelto held, on heparin gtts H/o CVA PVD Tobacco use      LOS: 7 days      Jared [name]  LOS: 8 days    Jared [name] 05/08/2023

## 2023-05-08 NOTE — TOC Progression Note (Signed)
Transition of Care [facility]) - Progression Note    Patient Details  Name: Jared [name] MRN: [id number] Date of Birth: 1955-10-08  Transition of [facility]) CM/SW Contact  Mearl Latin, LCSW Phone Number: 05/08/2023, 4:07 PM  Clinical Narrative:    Patient transferred to 5W. CSW continuing to follow for medical progression.    Expected Discharge Plan: Skilled Nursing Facility Barriers to Discharge: Continued Medical Work up, SNF Pending bed offer  Expected Discharge Plan and Services   Discharge Planning Services: CM Consult Post Acute Care Choice: NA Living arrangements for the past 2 months: Single Family Home                 DME Arranged: N/A         HH Arranged: NA           Social Determinants of Health (SDOH) Interventions SDOH Screenings   Food Insecurity: No Food Insecurity (04/30/2023)  Housing: Low Risk  (04/30/2023)  Transportation Needs: No Transportation Needs (04/30/2023)  Utilities: Not At Risk (04/30/2023)  Alcohol Screen: Low Risk  (02/06/2023)  Depression (PHQ2-9): Low Risk  (02/04/2023)  Recent Concern: Depression (PHQ2-9) - High Risk (01/09/2023)  Financial Resource Strain: Medium Risk (02/06/2023)  Physical Activity: Insufficiently Active (02/06/2023)  Social Connections: Moderately Integrated (02/06/2023)  Stress: No Stress Concern Present (02/06/2023)  Tobacco Use: High Risk (05/05/2023)  Health Literacy: Adequate Health Literacy (02/06/2023)    Readmission Risk Interventions    12/27/2022    2:20 PM 12/14/2021    1:38 PM 02/01/2021    3:22 PM  Readmission Risk Prevention Plan  Post Dischage Appt Complete Complete   Medication Screening Complete Complete   Transportation Screening Complete Complete Complete  PCP or Specialist Appt within 5-7 Days   Complete  Home Care Screening   Complete  Medication Review (RN CM)   Complete

## 2023-05-08 NOTE — Progress Notes (Signed)
PHARMACY - ANTICOAGULATION CONSULT NOTE  Pharmacy Consult for heparin  Indication:  IVC thrombus , suspected and history of Afib and CVA  Allergies  Allergen Reactions   Lisinopril Cough    Patient Measurements: Height: 6\' 3"  (190.5 cm) Weight: 99.3 kg (219 lb) IBW/kg (Calculated) : 84.5 Heparin Dosing Weight: 99.3 kg   Vital Signs: Temp: 98 F (36.7 C) (11/27 0400) Temp Source: Oral (11/27 0400) BP: 137/81 (11/27 0400) Pulse Rate: 95 (11/27 0400)  Labs: Recent Labs    05/06/23 0221 05/07/23 0249 05/08/23 0448  HGB 8.1* 8.9* 8.7*  HCT 27.6* 30.9* 30.8*  PLT 484* 528* 524*  HEPARINUNFRC 0.40 0.40 0.43  CREATININE 1.01 0.90 0.86    Estimated Creatinine Clearance: 96.9 mL/min (by C-G formula based on SCr of 0.86 mg/dL).  Assessment: 67 yo M with suspected PE from ECHO findings. V/Q scan neg for PE. Vague density in the IVC. Cannot rule out thrombus. Pt is s/p appendectomy with drain placement on 11/20. Pt is s/p transgluteal pelvic drain placement in IR on 11/24.  Patient has PMH Aflutter on Xarelto. Currently held (last dose 11/19) and on heparin.  Heparin level stable at 0.43, therapeutic, on heparin 1900 units/hr Hgb stable 8.7, Plts 524  11/26: RN reports that abdominal incision continuing to ooze, but serous, and not bloody. No issues with heparin gtt reported from nursing.   Goal of Therapy:  Heparin level 0.3-0.7 units/ml Monitor platelets by anticoagulation protocol: Yes   Plan:  Continue heparin infusion to 1900 units/hr Monitor daily CBC, heparin level, and for s/sx of bleeding Follow- up on plan to transition to oral [facility]    Thank you for allowing pharmacy to be a part of this patient's care.   Signe Colt, PharmD 05/08/2023 8:55 AM  **Pharmacist phone directory can be found on [email or web address] listed under [facility]**

## 2023-05-08 NOTE — Plan of Care (Signed)

## 2023-05-08 NOTE — Progress Notes (Signed)
Patient Name: Jared [name] Date of Encounter: 05/08/2023 [facility] Cardiologist: Dietrich Pates, MD   Interval Summary  .    67 yr old male with PMH of mild CAD, HFrEF with improved LVEF, HTN, PAD, HLD, paroxysmal atrial flutter on Xarleto, CVA, oxygen dependent COPD, active tobacco use.   He was admitted 11/19 for sepsis due to acute perforated appendicitis with microperforation, s/p lap appy 11/20, course complicated by AKI, A fib RVR.   Feeling much better today.  Shortness of breath improved after a dose of IV Lasix yesterday.  He really has no wheezing on exam today like he did yesterday.  Still some short of breath but much improved Vital Signs .    Vitals:   05/07/23 2030 05/08/23 0003 05/08/23 0400 05/08/23 0852  BP:  133/73 137/81 (!) 144/81  Pulse:  87 95 85  Resp:  15 13 17   Temp:  98.2 F (36.8 C) 98 F (36.7 C)   TempSrc:  Axillary Oral Oral  SpO2: 95%     Weight:      Height:        Intake/Output Summary (Last 24 hours) at 05/08/2023 1238 Last data filed at 05/08/2023 1610 Gross per 24 hour  Intake --  Output 560 ml  Net -560 ml      05/01/2023    1:34 PM 04/30/2023   11:14 PM 04/24/2023   10:40 AM  Last 3 Weights  Weight (lbs) 219 lb 219 lb 2.2 oz 219 lb 12.8 oz  Weight (kg) 99.338 kg 99.4 kg 99.701 kg      Telemetry/ECG    TELE Personally reviewed and demonstrates normal sinus rhythm with only a short burst of nonsustained atrial tachycardia.  Occasional PACs and PVCs   ECHO: 05/02/2023  1. Left ventricular ejection fraction, by estimation, is 55 to 60%. The left ventricle has normal function. The left ventricle has no regional wall motion abnormalities. Left ventricular diastolic parameters are consistent with Grade II diastolic dysfunction (pseudonormalization).   2. Right ventricular systolic function is moderately reduced. The right ventricular size is moderately enlarged. Tricuspid regurgitation signal is inadequate for assessing PA  pressure.   3. Left atrial size was mildly dilated.   4. The mitral valve is degenerative. No evidence of mitral valve  regurgitation. No evidence of mitral stenosis.   5. The aortic valve is tricuspid. Aortic valve regurgitation is not  visualized. Aortic valve sclerosis/calcification is present, without any evidence of aortic stenosis.   6. There is a vague density in the IVC. Cannot rule out thrombus.   Recommend dedicated Abdominal CTA vs. Abdominal US for further evaluation.  The inferior vena cava is dilated in size with >50% respiratory  variability, suggesting right atrial pressure of  8 mmHg.   7. In setting of syncope as well as RV dysfunction, consider acute PE.  Recommend Chest CTA if clinically indicated.    Physical Exam .    GEN: Well nourished, well developed in no acute distress HEENT: Normal NECK: No JVD; No carotid bruits LYMPHATICS: No lymphadenopathy CARDIAC:RRR, no murmurs, rubs, gallops RESPIRATORY:  Clear to auscultation without rales, wheezing or rhonchi  ABDOMEN: Soft, non-tender, non-distended MUSCULOSKELETAL:  No edema; No deformity  SKIN: Warm and dry NEUROLOGIC:  Alert and oriented x 3 PSYCHIATRIC:  Normal affect  Assessment & Plan .     Paroxysmal A fib/flutter with RVR - no further afib.  Had 1 short burst of PAT in the past 24 hours - PTA  Xarelto held >> Now on heparin  - Heart rates in sinus now in the 80s to 90s  - continue Toprol XL 50 mg daily - transition to DOAC when ok with surgery  HFrEF with improved LVEF RV dysfunction new this admission  - Echo 11/21 showed LVEF improved to 55-60% , no RWMA, grade II DD, RV mod reduced, RV mod enlarged, mild LAE, There is a vague density in the IVC. Cannot rule out thrombus.  - VQ scan negative for PE  - He does have a history of COPD severe restrictive lung disease and severe COPD noted on PFTs in 2021 followed by pulmonary - Suspect primary lung disease contributing to right-sided heart failure.   There was no TR jet to determine PA pressures. -Had significant wheezing on exam yesterday with chest x-ray showing some persistence of vascular congestion although marked improvement.  Shortness of breath significantly improved today and wheezing completely resolved after 40 mg IV Lasix -I's and O's appear incomplete with documented shows he put out 560 cc yesterday and is net -1 L -Daily weight not performed -Serum creatinine stable at 0.86 and potassium 3.9 mag 2.2 -Continue Lasix 40 mg IV daily for now -Follow strict I's and O's and daily weights while diuresing -check BNP today - Has never had a sleep study so would recommend outpatient PSG once he is discharged home -GDMT:  Continue Toprol XL 50mg  daily, Imdur 30mg  daily, Hydralazine 50mg  q8hours  Nonobstructive CAD -Cath in 2012 -Ischemia on Myoview 2016 -Not had any anginal symptoms -Aspirin 81 mg daily, atorvastatin 80 mg daily, Zetia 10 mg daily, Toprol XL 50 mg daily  Syncope and collapse - Patient reports 2 years of intermittent generalized body shaking with syncope and loss of consciousness that are often positional when going from sitting to standing and therefore sound orthostatic -Echo this admission shows preserved EF, grade 2 diastolic dysfunction with moderate reduction in RV function and RV dilatation and a vague density in the IVC, concerning for possible PE/IVC thrombus - VQ scan negative for PE  - Abdominal US ordered to rule out IVC thrombus but was non-diagnostic due to body habitus and air in abdominal cavity - DVT US negative - 21 beat run of NSVT on 11/23 - no further NSVT - may need to consider ILR placement once he recovers from acute illness if episodes of syncope continue to occur  Perforated appendicitis  AKI Anemia  Syncope  Intermittent involuntary movement of arms and legs  COPD - requested RT give an extra treatment Hx of CVA - per primary team    I have personally spent 30 minutes involved in  face-to-face and non-face-to-face activities for this patient on the day of the visit. Professional time spent includes the following activities: Preparing to see the patient (review of tests), Obtaining and/or reviewing separately obtained history (admission/discharge record), Performing a medically appropriate examination and/or evaluation , Ordering medications/tests/procedures, referring and communicating with other health care professionals, Documenting clinical information in the EMR, Independently interpreting results (not separately reported), Communicating results to the patient/family/caregiver, Counseling and educating the patient/family/caregiver and Care coordination (not separately reported).   For questions or updates, please contact [facility] Please consult [email or web address] for contact info under        Signed, Armanda Magic, MD

## 2023-05-09 DIAGNOSIS — K3532 Acute appendicitis with perforation and localized peritonitis, without abscess: Secondary | ICD-10-CM | POA: Diagnosis not present

## 2023-05-09 DIAGNOSIS — E876 Hypokalemia: Secondary | ICD-10-CM

## 2023-05-09 DIAGNOSIS — I5033 Acute on chronic diastolic (congestive) heart failure: Secondary | ICD-10-CM

## 2023-05-09 LAB — CBC
HCT: 29.3 % — ABNORMAL LOW (ref 39.0–52.0)
Hemoglobin: 8.3 g/dL — ABNORMAL LOW (ref 13.0–17.0)
MCH: 25.7 pg — ABNORMAL LOW (ref 26.0–34.0)
MCHC: 28.3 g/dL — ABNORMAL LOW (ref 30.0–36.0)
MCV: 90.7 fL (ref 80.0–100.0)
Platelets: 504 10*3/uL — ABNORMAL HIGH (ref 150–400)
RBC: 3.23 MIL/uL — ABNORMAL LOW (ref 4.22–5.81)
RDW: 17.3 % — ABNORMAL HIGH (ref 11.5–15.5)
WBC: 10.2 10*3/uL (ref 4.0–10.5)
nRBC: 0.2 % (ref 0.0–0.2)

## 2023-05-09 LAB — AEROBIC/ANAEROBIC CULTURE W GRAM STAIN (SURGICAL/DEEP WOUND)

## 2023-05-09 LAB — GLUCOSE, CAPILLARY
Glucose-Capillary: 163 mg/dL — ABNORMAL HIGH (ref 70–99)
Glucose-Capillary: 105 mg/dL — ABNORMAL HIGH (ref 70–99)
Glucose-Capillary: 121 mg/dL — ABNORMAL HIGH (ref 70–99)
Glucose-Capillary: 104 mg/dL — ABNORMAL HIGH (ref 70–99)

## 2023-05-09 LAB — HEPARIN LEVEL (UNFRACTIONATED): Heparin Unfractionated: 0.39 [IU]/mL (ref 0.30–0.70)

## 2023-05-09 LAB — BASIC METABOLIC PANEL
Anion gap: 7 (ref 5–15)
BUN: 8 mg/dL (ref 8–23)
CO2: 38 mmol/L — ABNORMAL HIGH (ref 22–32)
Calcium: 8.2 mg/dL — ABNORMAL LOW (ref 8.9–10.3)
Chloride: 92 mmol/L — ABNORMAL LOW (ref 98–111)
Creatinine, Ser: 1.01 mg/dL (ref 0.61–1.24)
GFR, Estimated: >60 mL/min (ref >60)
Glucose, Bld: 155 mg/dL — ABNORMAL HIGH (ref 70–99)
Potassium: 3.3 mmol/L — ABNORMAL LOW (ref 3.5–5.1)
Sodium: 137 mmol/L (ref 135–145)

## 2023-05-09 LAB — PHOSPHORUS: Phosphorus: 2.2 mg/dL — ABNORMAL LOW (ref 2.5–4.6)

## 2023-05-09 MED ORDER — POTASSIUM CHLORIDE CRYS ER 10 MEQ PO TBCR
30.0000 meq | EXTENDED_RELEASE_TABLET | Freq: Four times a day (QID) | ORAL | Status: AC
  Administered 2023-05-09 (×2): 30 meq via ORAL
  Filled 2023-05-09 (×2): qty 3

## 2023-05-09 MED ORDER — POTASSIUM CHLORIDE CRYS ER 20 MEQ PO TBCR: 40.0000 meq | EXTENDED_RELEASE_TABLET | Freq: Four times a day (QID) | ORAL | Status: DC

## 2023-05-09 MED ORDER — K PHOS MONO-SOD PHOS DI & MONO 155-852-130 MG PO TABS
500.0000 mg | ORAL_TABLET | Freq: Three times a day (TID) | ORAL | Status: AC
  Administered 2023-05-09 – 2023-05-10 (×4): 500 mg via ORAL
  Filled 2023-05-09 (×4): qty 2

## 2023-05-09 NOTE — Plan of Care (Signed)
Problem: Health Behavior/Discharge Planning: Goal: Ability to manage health-related needs will improve Outcome: Progressing   Problem: Clinical Measurements: Goal: Ability to maintain clinical measurements within normal limits will improve Outcome: Progressing   Problem: Nutrition: Goal: Adequate nutrition will be maintained Outcome: Progressing   Problem: Pain Management: Goal: General experience of comfort will improve Outcome: Progressing

## 2023-05-09 NOTE — Progress Notes (Signed)
Mobility Specialist Progress Note:   05/09/23 0830  Mobility  Activity Transferred from bed to chair  Level of Assistance Minimal assist, patient does 75% or more  Assistive Device None  Distance Ambulated (ft) 3 ft  Activity Response Tolerated well  Mobility Referral Yes  $Mobility charge 1 Mobility  Mobility Specialist Start Time (ACUTE ONLY) 0830  Mobility Specialist Stop Time (ACUTE ONLY) 0835  Mobility Specialist Time Calculation (min) (ACUTE ONLY) 5 min   Pt received in bed, agreeable to mobility session. MinA required to stand and pivot w/o RW. SpO2 75% on 3L, recovered once sitting, SpO2 90% on 3L. C/o SOB. Left pt comfortably in chair, all needs met, call bell in reach. MD at chair side.   Feliciana Rossetti Mobility Specialist Please contact via Special educational needs teacher or  Rehab office at [phone]

## 2023-05-09 NOTE — Plan of Care (Signed)
Problem: Health Behavior/Discharge Planning: Goal: Ability to manage health-related needs will improve Outcome: Progressing   Problem: Clinical Measurements: Goal: Ability to maintain clinical measurements within normal limits will improve Outcome: Progressing Goal: Will remain free from infection Outcome: Progressing Goal: Respiratory complications will improve Outcome: Progressing   Problem: Activity: Goal: Risk for activity intolerance will decrease Outcome: Progressing

## 2023-05-09 NOTE — Progress Notes (Signed)
Progress Note   Patient: Jared [name]:[id number] DOB: 1956-04-05 DOA: 04/30/2023     9 DOS: the patient was seen and examined on 05/09/2023   Brief hospital course: 67yo with h/o chronic combined CHF, HTN, CVA, afib on Xarelto, PVD, and COPD on 2L home O2 who presented on 11/19 with SOB and other complaints.  Fever 100.9, WBC 13.4, lactate 2.9, Creatinine 1.65 with AKI.  CT A?P with acute perforated appendicitis with microperforation.  Admitted for IV antibiotics, Xarelto washout.  Surgery is consulting.  Underwent lap appy on 11/20 but slow recovery.  Repeat CT with abscess and concern for peritonitis.  Underwent transgluteal pelvic drain placement on 11/24 with unresponsiveness/respiratory distress after procedure which resolved.  Assessment and Plan:  Sepsis due to acute perforated appendicitis with microperforation - lap appy on 11/20 - visible appendiceal perforation noted with abscess that was drained and fecaliths removed; surgical drain was left in place Remains on IV Zosyn Blood cultures negative x 5 days from admission Abdomen remains sore and distended with drain in LLQ Worsening leukocytosis with high risk of complications, repeat CT performed with deep abscess appreciated,, concern for peritonitis on 11/24 Went to IR for transgluteal pelvic drain placement, drain management by IR White blood cell count trending down  Acute on chronic hypoxic respiratory failure Event post-procedure following prone positioning for drain placement Required NRB O2 Monitored overnight in ICU Chronic respiratory failure is secondary to COPD on 2L, currently on 4L with ongoing wheezing despite nebs PRN duoneb, Albuterol Continue Breztri   Delirium Patient has had periodic delirium during the hospitalization with concern for seizures at one point but negative LTM EEG Currently with reported AV hallucinations Suspect this is multifactorial and related to ICU psychosis in conjunction with  polypharmacy and infection Will add prn IV Haldol and delirium precautions Neurology previously consulted Recommend NOT increasing Oxy or gabapentin   Syncope and collapse Patient reports 2 years of intermittent generalized body shaking with syncope and loss of consciousness that are often positional when going from sitting to standing. Last echocardiogram was 12/2021 with EF of 45 to 50% and global hypokinesis, grade 1 diastolic dysfunction.  No significant valvular abnormality. Repeat echo shows preserved EF, grade 2 diastolic dysfunction with moderate reduction in RV function and RV dilatation and a vague density in the IVC, concerning for possible PE/IVC thrombus STAT VQ scan negative for PE  Abdominal US ordered to rule out IVC thrombus but was non-diagnostic due to body habitus and air in abdominal cavity DVT US negative Cardiology consulting Will continue to treat empirically with Heparin for now 21 beat run of NSVT on 11/23 Repeat CXR today shows improvement and BNP is also improved, unlikely cardiogenic wheezing   IDA Patient with h/o iron deficiency anemia, possibly with superimposed ABLA from surgery Receives weekly IV iron infusions, given on 11/21  Hgb improved post-iron, appears to be stable   Essential hypertension Continue hydralazine, Imdur, metoprolol Held Lasix due to recurrent AKI - will add back   HLD Continue Zetia, atorvastatin  HFrEF with improved LVEF RV dysfunction new this admission  - Echo 11/21 showed LVEF improved to 55-60% , no RWMA, grade II DD, RV mod reduced, RV mod enlarged, mild LAE, There is a vague density in the IVC. Cannot rule out thrombus.  -Cardiology input greatly appreciated, diuresis per cardiology -Will need sleep study as an outpatient. -Continue with Toprol-XL, Imdur and hydralazine   Atrial flutter  Continue Toprol XL Holding Xarelto due to surgery, now on  IV heparin.   Chronic diastolic CHF Echo 11/21 with preserved EF and  grade 2 diastolic dysfunction Concern for IVC thrombus   History of CVA (cerebrovascular accident) Continue aspirin Xarelto currently on hold    Peripheral vascular disease  Continue aspirin, statin   Depression Continue Cymbalta, gabapentin   Tobacco use Ongoing use of close to 1 pack/day Nicotine patch offered   Malnutrition Nutrition Problem: Moderate Malnutrition Etiology: acute illness Signs/Symptoms: energy intake < 75% for > 7 days, mild muscle depletion, mild fat depletion Interventions: MVI  Hypokalemia Hypophosphatemia -replacing, monitor closely         Consultants: Surgery Cardiology Neurology PCCM IR Nutrition PT OT   Procedures: Appendectomy 11/20 Transgluteal pelvic drain placement 11/24   Antibiotics: Zosyn 11/19-    30 Day Unplanned Readmission Risk Score    Flowsheet Row ED to Hosp-Admission (Current) from 04/30/2023 in [place] [facility]  30 Day Unplanned Readmission Risk Score (%) 21.96 Filed at 05/07/2023 0401       This score is the patient's risk of an unplanned readmission within 30 days of being discharged (0 -100%). The score is based on dignosis, age, lab data, medications, orders, and past utilization.   Low:  0-14.9   Medium: 15-21.9   High: 22-29.9   Extreme: 30 and above           Subjective: Reports he is feeling better today, able to tolerate some soft food, reports BM yesterday      Objective: Vitals:   05/09/23 0545 05/09/23 0800  BP: (!) 140/75 (!) 141/74  Pulse:  84  Resp:  15  Temp:  98.2 F (36.8 C)  SpO2:  100%    Intake/Output Summary (Last 24 hours) at 05/09/2023 1201 Last data filed at 05/09/2023 0558 Gross per 24 hour  Intake 15 ml  Output 925 ml  Net -910 ml   Filed Weights   04/30/23 2314 05/01/23 1334  Weight: 99.4 kg 99.3 kg    Exam:   Awake Alert, Oriented X 3, in no apparent distress today, chronic ill-appearing Symmetrical Chest wall movement, air entry at  the bases RRR,No Gallops,Rubs or new Murmurs, No Parasternal Heave +ve B.Sounds, Abd Soft, abdominal wound bandaged, having anterior JP drain, and posterior IR drain No Cyanosis, Clubbing or edema, No new Rash or bruise      Data Reviewed: I have reviewed the patient's lab results since admission.  Pertinent labs for today include:   CO2 33 Glucose 123 WBC 14.1 Hgb 8.9 Platelets 528     Family Communication: None present today  Disposition: Status is: Inpatient Remains inpatient appropriate because: ongoing management      Unresulted Labs (From admission, onward)     Start     Ordered   05/09/23 0500  CBC  Daily,   R     Question:  Specimen collection method  Answer:  Lab=Lab collect   05/08/23 1458   05/09/23 0500  Basic metabolic panel  Daily,   R     Question:  Specimen collection method  Answer:  Lab=Lab collect   05/08/23 1458   05/06/23 0500  Heparin level (unfractionated)  Daily,   R      05/05/23 0140             Author: Huey Bienenstock, MD 05/09/2023 12:01 PM  For on call review [email or web address].

## 2023-05-09 NOTE — Progress Notes (Signed)
Progress Note  Patient Name: Jared [name] Date of Encounter: 05/09/2023  Primary Cardiologist: Dietrich Pates, MD   Subjective   Still some sob.   Inpatient Medications    Scheduled Meds:  acetaminophen  1,000 mg Oral Q6H   arformoterol  15 mcg Nebulization BID   aspirin EC  81 mg Oral Daily   atorvastatin  80 mg Oral Daily   budesonide (PULMICORT) nebulizer solution  0.5 mg Nebulization BID   Chlorhexidine Gluconate Cloth  6 each Topical Daily   docusate sodium  100 mg Oral BID   DULoxetine  60 mg Oral Daily   ezetimibe  10 mg Oral Daily   feeding supplement  1 Container Oral TID WC   feeding supplement  237 mL Oral BID BM   ferrous sulfate  325 mg Oral Q breakfast   folic acid  1 mg Oral Daily   furosemide  40 mg Oral Daily   gabapentin  300 mg Oral BID   hydrALAZINE  50 mg Oral Q8H   isosorbide mononitrate  30 mg Oral Daily   metoprolol succinate  50 mg Oral Daily   multivitamin with minerals  1 tablet Oral Daily   nicotine  21 mg Transdermal Daily   pantoprazole  40 mg Oral Daily   phosphorus  500 mg Oral TID   polyethylene glycol  17 g Oral BID   potassium chloride  30 mEq Oral Q6H   revefenacin  175 mcg Nebulization Daily   sodium chloride flush  5 mL Intracatheter Q8H   Continuous Infusions:  heparin 1,900 Units/hr (05/09/23 0430)   piperacillin-tazobactam (ZOSYN)  IV 3.375 g (05/09/23 0026)   PRN Meds: albuterol, bisacodyl, haloperidol lactate, morphine injection, oxyCODONE   Vital Signs    Vitals:   05/09/23 0000 05/09/23 0400 05/09/23 0545 05/09/23 0800  BP: 124/81 125/71 (!) 140/75 (!) 141/74  Pulse: 81 78  84  Resp: 16 16  15   Temp: 97.9 F (36.6 C) 97.7 F (36.5 C)  98.2 F (36.8 C)  TempSrc: Oral Oral  Oral  SpO2: 98% 99%  100%  Weight:      Height:        Intake/Output Summary (Last 24 hours) at 05/09/2023 0954 Last data filed at 05/09/2023 0558 Gross per 24 hour  Intake 15 ml  Output 925 ml  Net -910 ml   Filed Weights    04/30/23 2314 05/01/23 1334  Weight: 99.4 kg 99.3 kg    Telemetry    Nsr at 90/min - Personally Reviewed  ECG    none - Personally Reviewed  Physical Exam   GEN: No acute distress.   Neck: No JVD Cardiac: RRR, no murmurs, rubs, or gallops.  Respiratory: Clear to auscultation bilaterally. GI: Soft, nontender, non-distended, bandage clean and dry.  MS: No edema; No deformity. Neuro:  Nonfocal  Psych: Normal affect   Labs    Chemistry Recent Labs  Lab 05/03/23 1523 05/04/23 0550 05/05/23 0848 05/07/23 0249 05/08/23 0448 05/09/23 0337  NA  --  131*   < > 135 137 137  K  --  3.9   < > 3.9 3.9 3.3*  CL  --  92*   < > 92* 93* 92*  CO2  --  29   < > 33* 32 38*  GLUCOSE  --  150*   < > 123* 93 155*  BUN  --  19   < > 8 8 8   CREATININE  --  1.44*   < >  0.90 0.86 1.01  CALCIUM  --  8.3*   < > 8.6* 8.4* 8.2*  PROT 6.1*  --   --   --   --   --   ALBUMIN 2.4* 2.2*  --   --   --   --   AST 36  --   --   --   --   --   ALT 16  --   --   --   --   --   ALKPHOS 100  --   --   --   --   --   BILITOT 0.5  --   --   --   --   --   GFRNONAA  --  53*   < > >60 >60 >60  ANIONGAP  --  10   < > 10 12 7    < > = values in this interval not displayed.     Hematology Recent Labs  Lab 05/07/23 0249 05/08/23 0448 05/09/23 0337  WBC 14.1* 11.2* 10.2  RBC 3.46* 3.43* 3.23*  HGB 8.9* 8.7* 8.3*  HCT 30.9* 30.8* 29.3*  MCV 89.3 89.8 90.7  MCH 25.7* 25.4* 25.7*  MCHC 28.8* 28.2* 28.3*  RDW 17.3* 17.4* 17.3*  PLT 528* 524* 504*    Cardiac EnzymesNo results for input(s): "TROPONINI" in the last 168 hours. No results for input(s): "TROPIPOC" in the last 168 hours.   BNP Recent Labs  Lab 05/02/23 1717 05/07/23 0249  BNP 261.2* 203.0*     DDimer No results for input(s): "DDIMER" in the last 168 hours.   Radiology    No results found.  Cardiac Studies   none  Patient Profile     67 y.o. male admitted with perforated viscous, s/p surgery complicated by volume overload,  afib   Assessment & Plan    Atrial fib - he appears to be maintaining NSR. Continue current meds. Switch to oral anti-coag when ok with surgery. Acute on chronic diastolic heart failure - his weight is 99 kg, unchanged from yesterday. Renal function also unchanged. Probably still a little overloaded. Continue lasix.  Hypokalemia - replete potassium.     For questions or updates, please contact [facility] Please consult [email or web address] for contact info under Cardiology/STEMI.      Signed, Lewayne Bunting, MD  05/09/2023, 9:54 AM

## 2023-05-09 NOTE — Progress Notes (Signed)
8 Days Post-Op   Subjective/Chief Complaint: Pt feels a little better  Pain controlled no n/v   Objective: Vital signs in last 24 hours: Temp:  [97.6 F (36.4 C)-98.5 F (36.9 C)] 98.2 F (36.8 C) (11/28 0800) Pulse Rate:  [78-92] 84 (11/28 0800) Resp:  [15-19] 15 (11/28 0800) BP: (124-151)/(71-81) 141/74 (11/28 0800) SpO2:  [94 %-100 %] 100 % (11/28 0800) Last BM Date : 05/08/23  Intake/Output from previous day: 11/27 0701 - 11/28 0700 In: 15 [I.V.:15] Out: 925 [Urine:900; Drains:25] Intake/Output this shift: No intake/output data recorded.  Abdominal port site open with serous drainage  no pus  Distention noted  no peritonitis   Lab Results:  Recent Labs    05/08/23 0448 05/09/23 0337  WBC 11.2* 10.2  HGB 8.7* 8.3*  HCT 30.8* 29.3*  PLT 524* 504*   BMET Recent Labs    05/08/23 0448 05/09/23 0337  NA 137 137  K 3.9 3.3*  CL 93* 92*  CO2 32 38*  GLUCOSE 93 155*  BUN 8 8  CREATININE 0.86 1.01  CALCIUM 8.4* 8.2*   PT/INR No results for input(s): "LABPROT", "INR" in the last 72 hours. ABG No results for input(s): "PHART", "HCO3" in the last 72 hours.  Invalid input(s): "PCO2", "PO2"  Studies/Results: No results found.  Anti-infectives: Anti-infectives (From admission, onward)    Start     Dose/Rate Route Frequency Ordered Stop   05/07/23 0945  piperacillin-tazobactam (ZOSYN) IVPB 3.375 g        3.375 g 12.5 mL/hr over 240 Minutes Intravenous Every 8 hours 05/07/23 0851     05/07/23 0930  piperacillin-tazobactam (ZOSYN) IVPB 3.375 g  Status:  Discontinued        3.375 g 100 mL/hr over 30 Minutes Intravenous Every 8 hours 05/07/23 0836 05/07/23 0851   04/30/23 1930  piperacillin-tazobactam (ZOSYN) IVPB 3.375 g        3.375 g 12.5 mL/hr over 240 Minutes Intravenous Every 8 hours 04/30/23 1915 05/05/23 1940   04/30/23 1145  piperacillin-tazobactam (ZOSYN) IVPB 3.375 g        3.375 g 100 mL/hr over 30 Minutes Intravenous  Once 04/30/23 1131  04/30/23 1242       Assessment/Plan: s/p Procedure(s): APPENDECTOMY LAPAROSCOPIC (N/A) POD#8 - lap appy for perforated appendicitis 11/20 - MW - s/p IR drain placement 11/24, gram stain GPC, GNR, GPR. Culture pending. Continue IV zosyn and follow culture - continue surgical drain - WBC  monitor.  - AKI, creatinine improving - nephrology s/o - On soft diet but not really eating, he is tolerating liquids. Encourage protein shakes. Dulcolax suppository today. Continue miralax. - oob/pulm toilet; encouraged ambulation in halls. Continue therapies - rec SNF   FEN: soft, increase bowel regimen ID: zosyn 11/19>> VTE: hold xarelto. SCDs. Hep gtt - once po intake improves, can restart    Syncope COPD/Chronic resp failure - on 2 lpm at baseline HTN Atrial flutter - xarelto held, on heparin gtts H/o CVA PVD Tobacco use  LOS: 9 days    Clovis Pu Blessing Zaucha 05/09/2023

## 2023-05-09 NOTE — Progress Notes (Signed)
Nutrition Follow-up  DOCUMENTATION CODES:   Non-severe (moderate) malnutrition in context of acute illness/injury  INTERVENTION:  Continue current diet  Boost Breeze po TID with meals, each supplement provides 250 kcal and 9 grams of protein   - Ensure Enlive po BID between meals, each supplement provides 350 kcal and 20 grams of protein   - Continue MVI with minerals daily   NUTRITION DIAGNOSIS:   Moderate Malnutrition related to acute illness as evidenced by energy intake < 75% for > 7 days, mild muscle depletion, mild fat depletion. Ongoing with interventions in place.     GOAL:   Patient will meet greater than or equal to 90% of their needs    MONITOR:   PO intake, Supplement acceptance, Labs, Weight trends, I & O's  REASON FOR ASSESSMENT:   Consult Assessment of nutrition requirement/status  ASSESSMENT:   67 y.o. male with PMH of CHF, T2DM, HTN, CVA, atrial flutter, CAD, AVM, PVD and COPD with chronic hypoxic respiratory failure on 2L who presents with numerous complaints including headache, abdominal, testicular pain. Found to have acute perforated appendicitis. Pt with recent transfer to 5w. Diet advanced with recent reported tolerance. Reached out via phone  with no answer. Reached out to team.  MD reports that pt stated that he is starting to tolerate GI soft diet and that appetite is improving. Currently receiving Ensure Plus High Protein po BID, each supplement provides 350 kcal and 20 grams of protein. Boost Breeze po TID, each supplement provides 250 kcal and 9 grams of protein  11/19 - CT showed acute perforated appendicitis with microperforation  11/20 - s/p laparoscopic appendectomy, clear liquids 11/21 - NPO, later advanced to clear liquids 11/22 - Nephrology consulted for AKI 11/24 - NPO, s/p CT guided transgluteal pelvis drain placement by IR, transferred to ICU 11/25 - GI soft diet  Admit weight: 99.3 kg Current weight: n/a  Weight  history: 05/01/23 99.3 kg  04/24/23 99.7 kg  04/17/23 99.1 kg  03/21/23 99.3 kg  02/27/23 98 kg  02/20/23 97.8 kg  02/13/23 98.2 kg  02/06/23 98 kg  02/04/23 98.1 kg  01/28/23 98 kg      Average Meal Intake: No current documentation.   Nutritionally Relevant Medications: Scheduled Meds:  atorvastatin  80 mg Oral Daily   budesonide (PULMICORT) nebulizer solution  0.5 mg Nebulization BID   DULoxetine  60 mg Oral Daily   feeding supplement  1 Container Oral TID WC   feeding supplement  237 mL Oral BID BM   ferrous sulfate  325 mg Oral Q breakfast   folic acid  1 mg Oral Daily   furosemide  40 mg Oral Daily   gabapentin  300 mg Oral BID   hydrALAZINE  50 mg Oral Q8H   multivitamin with minerals  1 tablet Oral Daily   phosphorus  500 mg Oral TID   potassium chloride  30 mEq Oral Q6H    Labs Reviewed    NUTRITION - FOCUSED PHYSICAL EXAM:  Flowsheet Row Most Recent Value  Orbital Region Mild depletion  Upper Arm Region Mild depletion  Thoracic and Lumbar Region Unable to assess  Buccal Region Mild depletion  Temple Region Severe depletion  Clavicle Bone Region Mild depletion  Clavicle and Acromion Bone Region Mild depletion  Scapular Bone Region Mild depletion  Dorsal Hand Mild depletion  Patellar Region Moderate depletion  Anterior Thigh Region Moderate depletion  Posterior Calf Region Unable to assess  Edema (RD Assessment) None  Hair  Reviewed  Eyes Reviewed  Mouth Reviewed  Skin Reviewed  Nails Reviewed       Diet Order:   Diet Order             DIET SOFT Fluid consistency: Thin  Diet effective now                   EDUCATION NEEDS:   Education needs have been addressed  Skin:  Skin Assessment: Reviewed RN Assessment Skin Integrity Issues:: Incisions, Other (Comment) Incisions: closed abd Other: puncture wound R buttocks for drain placement  Last BM:  11/27  Height:   Ht Readings from Last 1 Encounters:  05/01/23 6\' 3"  (1.905 m)     Weight:   Wt Readings from Last 1 Encounters:  05/01/23 99.3 kg    Ideal Body Weight:  89.1 kg  BMI:  Body mass index is 27.37 kg/m.  Estimated Nutritional Needs:   Kcal:  2400-2600 kcal  Protein:  130-150 gm  Fluid:  >2.4L    Jamelle Haring RDN, LDN Clinical Dietitian  RDN pager # available on Amion

## 2023-05-09 NOTE — Progress Notes (Signed)
PHARMACY - ANTICOAGULATION CONSULT NOTE  Pharmacy Consult for heparin  Indication:  IVC thrombus , suspected and history of Afib and CVA  Allergies  Allergen Reactions   Lisinopril Cough    Patient Measurements: Height: 6\' 3"  (190.5 cm) Weight: 99.3 kg (219 lb) IBW/kg (Calculated) : 84.5 Heparin Dosing Weight: 99.3 kg   Vital Signs: Temp: 98.2 F (36.8 C) (11/28 0800) Temp Source: Oral (11/28 0800) BP: 141/74 (11/28 0800) Pulse Rate: 84 (11/28 0800)  Labs: Recent Labs    05/07/23 0249 05/08/23 0448 05/09/23 0337  HGB 8.9* 8.7* 8.3*  HCT 30.9* 30.8* 29.3*  PLT 528* 524* 504*  HEPARINUNFRC 0.40 0.43 0.39  CREATININE 0.90 0.86 1.01    Estimated Creatinine Clearance: 82.5 mL/min (by C-G formula based on SCr of 1.01 mg/dL).  Assessment: 67 yo M with suspected PE from ECHO findings. V/Q scan neg for PE. Vague density in the IVC. Cannot rule out thrombus. Pt is s/p appendectomy with drain placement on 11/20. Pt is s/p transgluteal pelvic drain placement in IR on 11/24.  Patient has PMH Aflutter on Xarelto. Currently held (last dose 11/19) and on heparin.  Heparin level stable at 0.39, therapeutic, on heparin 1900 units/hr Hgb stable 8.3, Plts 504  11/26: RN reports that abdominal incision continuing to ooze, but serous, and not bloody. No issues with heparin gtt reported from nursing.   Goal of Therapy:  Heparin level 0.3-0.7 units/ml Monitor platelets by anticoagulation protocol: Yes   Plan:  Continue heparin infusion at 1900 units/hr Monitor daily CBC, heparin level, and for s/sx of bleeding Follow-up improved PO intake and plan to transition to oral [facility]    Thank you for allowing pharmacy to be a part of this patient's care.   Signe Colt, PharmD 05/09/2023 10:50 AM  **Pharmacist phone directory can be found on [email or web address] listed under [facility]**

## 2023-05-10 DIAGNOSIS — K3532 Acute appendicitis with perforation and localized peritonitis, without abscess: Secondary | ICD-10-CM | POA: Diagnosis not present

## 2023-05-10 DIAGNOSIS — I50810 Right heart failure, unspecified: Secondary | ICD-10-CM | POA: Diagnosis not present

## 2023-05-10 DIAGNOSIS — I4719 Other supraventricular tachycardia: Secondary | ICD-10-CM | POA: Diagnosis not present

## 2023-05-10 DIAGNOSIS — I5021 Acute systolic (congestive) heart failure: Secondary | ICD-10-CM | POA: Diagnosis not present

## 2023-05-10 DIAGNOSIS — I480 Paroxysmal atrial fibrillation: Secondary | ICD-10-CM | POA: Diagnosis not present

## 2023-05-10 LAB — BASIC METABOLIC PANEL
Anion gap: 6 (ref 5–15)
BUN: 7 mg/dL — ABNORMAL LOW (ref 8–23)
CO2: 37 mmol/L — ABNORMAL HIGH (ref 22–32)
Calcium: 8.1 mg/dL — ABNORMAL LOW (ref 8.9–10.3)
Chloride: 98 mmol/L (ref 98–111)
Creatinine, Ser: 0.88 mg/dL (ref 0.61–1.24)
GFR, Estimated: >60 mL/min (ref >60)
Glucose, Bld: 142 mg/dL — ABNORMAL HIGH (ref 70–99)
Potassium: 3.7 mmol/L (ref 3.5–5.1)
Sodium: 141 mmol/L (ref 135–145)

## 2023-05-10 LAB — GLUCOSE, CAPILLARY
Glucose-Capillary: 168 mg/dL — ABNORMAL HIGH (ref 70–99)
Glucose-Capillary: 125 mg/dL — ABNORMAL HIGH (ref 70–99)
Glucose-Capillary: 92 mg/dL (ref 70–99)
Glucose-Capillary: 109 mg/dL — ABNORMAL HIGH (ref 70–99)

## 2023-05-10 LAB — CBC
HCT: 29.7 % — ABNORMAL LOW (ref 39.0–52.0)
Hemoglobin: 8.5 g/dL — ABNORMAL LOW (ref 13.0–17.0)
MCH: 26 pg (ref 26.0–34.0)
MCHC: 28.6 g/dL — ABNORMAL LOW (ref 30.0–36.0)
MCV: 90.8 fL (ref 80.0–100.0)
Platelets: 489 10*3/uL — ABNORMAL HIGH (ref 150–400)
RBC: 3.27 MIL/uL — ABNORMAL LOW (ref 4.22–5.81)
RDW: 17.4 % — ABNORMAL HIGH (ref 11.5–15.5)
WBC: 10.1 10*3/uL (ref 4.0–10.5)
nRBC: 0 % (ref 0.0–0.2)

## 2023-05-10 LAB — HEPARIN LEVEL (UNFRACTIONATED): Heparin Unfractionated: 0.49 [IU]/mL (ref 0.30–0.70)

## 2023-05-10 MED ORDER — OXYCODONE HCL 5 MG PO TABS
10.0000 mg | ORAL_TABLET | Freq: Once | ORAL | Status: AC
  Administered 2023-05-10: 10 mg via ORAL
  Filled 2023-05-10: qty 2

## 2023-05-10 MED ORDER — RIVAROXABAN 20 MG PO TABS
20.0000 mg | ORAL_TABLET | Freq: Every day | ORAL | Status: DC
  Administered 2023-05-10 – 2023-05-15 (×6): 20 mg via ORAL
  Filled 2023-05-10 (×6): qty 1

## 2023-05-10 MED ORDER — FUROSEMIDE 10 MG/ML IJ SOLN
40.0000 mg | Freq: Once | INTRAMUSCULAR | Status: AC
  Administered 2023-05-10: 40 mg via INTRAVENOUS
  Filled 2023-05-10: qty 4

## 2023-05-10 NOTE — Plan of Care (Signed)
  Problem: Health Behavior/Discharge Planning: Goal: Ability to manage health-related needs will improve Outcome: Progressing   Problem: Clinical Measurements: Goal: Respiratory complications will improve Outcome: Progressing   Problem: Activity: Goal: Risk for activity intolerance will decrease Outcome: Progressing   Problem: Pain Management: Goal: General experience of comfort will improve Outcome: Progressing

## 2023-05-10 NOTE — Progress Notes (Signed)
TRH night cross cover note:   I was notified by RN of the patient's request for a one-time additional dose of oxycodone for breakthrough abdominal discomfort relative to his existing order for prn oxycodone.  He also has an existing order for prn IV morphine, but he prefers the oxycodone, as he conveys that the morphine makes him feel "loopy".   I subsequently placed an order for a one-time additional dose of oxycodone 10 mg p.o. x 1 now.     Newton Pigg, DO Hospitalist

## 2023-05-10 NOTE — Progress Notes (Signed)
Mobility Specialist Progress Note;   05/10/23 0900  Mobility  Activity Transferred from bed to chair  Level of Assistance Contact guard assist, steadying assist  Assistive Device None  Activity Response Tolerated well  Mobility Referral Yes  $Mobility charge 1 Mobility  Mobility Specialist Start Time (ACUTE ONLY) 0900  Mobility Specialist Stop Time (ACUTE ONLY) 0910  Mobility Specialist Time Calculation (min) (ACUTE ONLY) 10 min   Pt agreeable to mobility. Required MinG assistance for transfer from bed to chair. No c/o during session. VSS on 3LO2. Pt left in chair with all needs met.  Caesar Bookman Mobility Specialist Please contact via SecureChat or Rehab Office [phone]

## 2023-05-10 NOTE — Progress Notes (Signed)
Physical Therapy Treatment Patient Details Name: Jared [name] MRN: [id number] DOB: 08-14-1955 Today's Date: 05/10/2023   History of Present Illness 67 y.o. male presents to [facility]/30/23 w/ acute L lower abdominal pain, L testicle pain, and h/a. Admitted w/ sepsis 2/2 acute perforated appendicitis s/p laparoscopic appendectomy 11/20. Echo w/ density in IVC, VQ scan negative. 11/24 developed Acute on chronic hypoxic respiratory failure, transferred to ICU. PMHx: AVM, chronic respiratory failure (2L O2 at home) CABG, CAD, CAP, CHF, COPD, CVA, HTN, A flutter, DM2.    PT Comments  Pt progressing well, yet not at baseline, but CGA overall.  Emphasis on safe transitions, sit to stand and progression of gait stability/speed and stamina.     If plan is discharge home, recommend the following: A little help with walking and/or transfers;A little help with bathing/dressing/bathroom;Assistance with cooking/housework;Assist for transportation;Help with stairs or ramp for entrance   Can travel by private vehicle     Yes  Equipment Recommendations  None recommended by PT    Recommendations for Other Services       Precautions / Restrictions Precautions Precautions: Fall Precaution Comments: abdominal surgery, jp drains, open drain  abdomen     Mobility  Bed Mobility   Bed Mobility: Supine to Sit, Sit to Supine     Supine to sit: Used rails, Contact guard Sit to supine: Contact guard assist   General bed mobility comments: pt transitioned up via R elbow after untangling his LE's.  scooted easily without assist.    Transfers Overall transfer level: Needs assistance Equipment used: Rolling walker (2 wheels) Transfers: Sit to/from Stand Sit to Stand: Contact guard assist           General transfer comment: assist forward and to power up.    Ambulation/Gait Ambulation/Gait assistance: Contact guard assist Gait Distance (Feet): 280 Feet Assistive device: Rolling walker (2  wheels) Gait Pattern/deviations: Step-through pattern, Decreased stride length, Trunk flexed Gait velocity: decr Gait velocity interpretation: <1.8 ft/sec, indicate of risk for recurrent falls   General Gait Details: generally steady, but slower with flexed posture.   Stairs             Wheelchair Mobility     Tilt Bed    Modified Rankin (Stroke Patients Only)       Balance     Sitting balance-Leahy Scale: Fair       Standing balance-Leahy Scale: Poor                              Cognition Arousal: Alert Behavior During Therapy: WFL for tasks assessed/performed Overall Cognitive Status: No family/caregiver present to determine baseline cognitive functioning                                 General Comments: some confusion        Exercises      General Comments General comments (skin integrity, edema, etc.): HR in the 80's and low 90's during gait.  PLETH was noisy and unable to get an adequate reading from his toe.  Pt did respond well to gait on 4L [place]      Pertinent Vitals/Pain Pain Assessment Pain Assessment: Faces Faces Pain Scale: Hurts little more Pain Location: abdomen Pain Descriptors / Indicators: Operative site guarding Pain Intervention(s): Monitored during session    Home Living  Prior Function            PT Goals (current goals can now be found in the care plan section) Acute Rehab PT Goals Patient Stated Goal: to go home PT Goal Formulation: With patient Time For Goal Achievement: 05/20/23 Potential to Achieve Goals: Good Progress towards PT goals: Progressing toward goals    Frequency    Min 1X/week      PT Plan      Co-evaluation              AM-PAC PT "6 Clicks" Mobility   Outcome Measure  Help needed turning from your back to your side while in a flat bed without using bedrails?: A Little Help needed moving from lying on your back to sitting on  the side of a flat bed without using bedrails?: A Little Help needed moving to and from a bed to a chair (including a wheelchair)?: A Little Help needed standing up from a chair using your arms (e.g., wheelchair or bedside chair)?: A Little Help needed to walk in hospital room?: A Little Help needed climbing 3-5 steps with a railing? : A Lot 6 Click Score: 17    End of Session Equipment Utilized During Treatment: Oxygen Activity Tolerance: Patient tolerated treatment well Patient left: in bed;with call bell/phone within reach;with bed alarm set Nurse Communication: Mobility status PT Visit Diagnosis: Unsteadiness on feet (R26.81);Repeated falls (R29.6);Other abnormalities of gait and mobility (R26.89);Muscle weakness (generalized) (M62.81)     Time: 1914-7829 PT Time Calculation (min) (ACUTE ONLY): 22 min  Charges:    $Gait Training: 8-22 mins PT General Charges $$ ACUTE PT VISIT: 1 Visit                     05/10/2023  Jacinto Halim., PT Acute Rehabilitation Services [phone]  (office)   Eliseo Gum Yarenis Cerino 05/10/2023, 6:12 PM

## 2023-05-10 NOTE — TOC Progression Note (Signed)
Transition of Care [facility]) - Progression Note    Patient Details  Name: Nizar Friedrich MRN: [id number] Date of Birth: 1956-01-28  [facility]) CM/SW Contact  Mearl Latin, LCSW Phone Number: 05/10/2023, 2:07 PM  Clinical Narrative:    CSW met with patient and provided SNF bed offers and Medicare ratings list. Patient requested CSW speak with his sister about the bed options as he is not familiar with them.  CSW spoke with patient's sister, Adela Lank, and provided bed offers. She will review options and let CSW know.   Skilled Nursing Rehab Facilities-   [email or web address]   Ratings out of 5 stars (5 the highest)   Name Address  Phone # Quality Care Staffing Health Inspection Overall  [facility] [address] [phone] 2 2 5 5   [facility] [address] [place] [phone] 4 2 4 4   [facility] [address], [place]in[place] Otto [phone] 2 1 2 1   [facility] [address] [place] [phone] 3 1 4 3   [facility]  [address], [place] [phone] 4 4 5 5   [facility] [address] [facility] [phone] 3 2 2 2   [facility] [address] [place] [phone] 5 1 2 2   [facility] [address] [place] [phone] 1 1 3 1   [address] ([place]) [address], [place] [phone] 2 2 2 2   [facility] [address] [place], [place] [phone] 2 2 1 1   [facility]) [address], [place] [phone] 3 1 1 1   Eligha Bridegroom [address] Liliane Shi [phone] 3 3 4 4   [facility] [address] [place] [phone] 2 2 3 3           [facility] [address], [place] [phone] 4 2 1 1   [facility] [place] [address], [place] [phone] 1 1 2 1   [facility] [address], [place] [phone] 2 1 4 3   [facility] [address] [phone] 2 1 4 3   [facility] [address] [place] [phone] 2 3 3 3           [address] (no [facility]) [address], [place] [phone] 4 5 5 5   [facility] (No [employer]) [address] Lavera Guise [phone] 1 2 4 3   [facility] [address], [place] [phone] 2 1 2 1   [facility] (No [employer]) [address], [place] [phone] 4 1 5 4   [facility] [address] [facility] [phone] 3 4 2 2   [facility] [address] [place] [phone] 2 1 1 1   [place] [address] Liliane Shi [phone] 4 2 5 5   [facility]  [address] [place] [phone] 2 2 3 3   [facility] [address] [place] [phone] 4 1 1 1   [facility] [address] [place], [place] [phone] 2 2 3 3           [facility] [address] [place] [phone] 2 1 1 1   Renelda Mom [address] Evlyn Clines  [phone] 3 3 3 3   [facility] (No [employer]) [address] [place] [phone] 2 2 4 4   [facility]) [address], [place]al[place] Levan [phone] 2 1 1 1   [facility] [address], [place] Levan [phone] 4 3 5 5   [facility] [address], [place] [phone]  1 1 1 1           [facility] [address] [place], [place] [phone] 5 4 5 5   [facility])  [address] [place] [phone] 1 1 2 1   [facility]) [address], [place] [phone]  2 4 4   [facility] [address] [place] [phone] 3 5 5 5   [address] [place], [place] [phone] 4 2 2 2   [facility]) [address] [place] [phone] 2 1 3 2       Expected Discharge Plan: Skilled Nursing Facility Barriers to Discharge: Continued Medical Work up, English as a second language teacher  Expected Discharge Plan and Services In-house Referral: Clinical  Social Work Discharge Planning Services: CM Consult Post Acute Care Choice: Skilled Nursing Facility Living arrangements for the past 2 months: Single Family Home                 DME Arranged: N/A         HH Arranged: NA           Social Determinants of Health (SDOH) Interventions SDOH Screenings   Food Insecurity: No Food Insecurity (04/30/2023)  Housing: Low Risk  (04/30/2023)  Transportation Needs: No Transportation Needs (04/30/2023)  Utilities: Not At Risk (04/30/2023)  Alcohol Screen: Low Risk  (02/06/2023)  Depression (PHQ2-9): Low Risk  (02/04/2023)  Recent Concern: Depression (PHQ2-9) - High Risk (01/09/2023)  Financial Resource Strain: Medium Risk (02/06/2023)  Physical Activity: Insufficiently Active (02/06/2023)  Social Connections: Moderately Integrated (02/06/2023)  Stress: No Stress Concern Present (02/06/2023)  Tobacco Use: High Risk (05/05/2023)  Health Literacy: Adequate Health Literacy (02/06/2023)    Readmission Risk Interventions    12/27/2022    2:20 PM 12/14/2021    1:38 PM 02/01/2021    3:22 PM  Readmission Risk Prevention Plan  Post Dischage Appt Complete Complete   Medication Screening Complete Complete   Transportation Screening Complete Complete Complete  PCP or Specialist Appt within 5-7 Days   Complete  Home Care Screening   Complete  Medication Review (RN CM)   Complete

## 2023-05-10 NOTE — Progress Notes (Signed)
Progress Note   Patient: Jared [name] WUJ:[id number] DOB: [birth year] DOA: 04/30/2023     10 DOS: the patient was seen and examined on 05/10/2023   Brief hospital course: 67yo with h/o chronic combined CHF, HTN, CVA, afib on Xarelto, PVD, and COPD on 2L home O2 who presented on 11/19 with SOB and other complaints.  Fever 100.9, WBC 13.4, lactate 2.9, Creatinine 1.65 with AKI.  CT A?P with acute perforated appendicitis with microperforation.  Admitted for IV antibiotics, Xarelto washout.  Surgery is consulting.  Underwent lap appy on 11/20 but slow recovery.  Repeat CT with abscess and concern for peritonitis.  Underwent transgluteal pelvic drain placement on 11/24 with unresponsiveness/respiratory distress after procedure which resolved.  Assessment and Plan:  Sepsis due to acute perforated appendicitis with microperforation - lap appy on 11/20 - visible appendiceal perforation noted with abscess that was drained and fecaliths removed; surgical drain was left in place Remains on IV Zosyn Blood cultures negative x 5 days from admission Abdomen remains sore and distended with drain in LLQ Worsening leukocytosis with high risk of complications, repeat CT performed with deep abscess appreciated,, concern for peritonitis on 11/24 Went to IR for transgluteal pelvic drain placement, drain management by IR White blood cell count trending down  Acute on chronic hypoxic respiratory failure Event post-procedure following prone positioning for drain placement Required NRB O2 Monitored overnight in ICU Chronic respiratory failure is secondary to COPD on 2L, currently on 4L with ongoing wheezing despite nebs PRN duoneb, Albuterol Continue Breztri   Delirium Patient has had periodic delirium during the hospitalization with concern for seizures at one point but negative LTM EEG Currently with reported AV hallucinations Suspect this is multifactorial and related to ICU psychosis in conjunction with  polypharmacy and infection Will add prn IV Haldol and delirium precautions Neurology previously consulted Recommend NOT increasing Oxy or gabapentin   Syncope and collapse Patient reports 2 years of intermittent generalized body shaking with syncope and loss of consciousness that are often positional when going from sitting to standing. Last echocardiogram was 12/2021 with EF of 45 to 50% and global hypokinesis, grade 1 diastolic dysfunction.  No significant valvular abnormality. Repeat echo shows preserved EF, grade 2 diastolic dysfunction with moderate reduction in RV function and RV dilatation and a vague density in the IVC, concerning for possible PE/IVC thrombus STAT VQ scan negative for PE  Abdominal US ordered to rule out IVC thrombus but was non-diagnostic due to body habitus and air in abdominal cavity DVT US negative Cardiology consulting Will continue to treat empirically with Heparin for now 21 beat run of NSVT on 11/23 Repeat CXR today shows improvement and BNP is also improved, unlikely cardiogenic wheezing   IDA Patient with h/o iron deficiency anemia, possibly with superimposed ABLA from surgery Receives weekly IV iron infusions, given on 11/21  Hgb improved post-iron, appears to be stable   Essential hypertension Continue hydralazine, Imdur, metoprolol Held Lasix due to recurrent AKI - will add back   HLD Continue Zetia, atorvastatin  HFrEF with improved LVEF RV dysfunction new this admission  - Echo 11/21 showed LVEF improved to 55-60% , no RWMA, grade II DD, RV mod reduced, RV mod enlarged, mild LAE, There is a vague density in the IVC. Cannot rule out thrombus.  -Cardiology input greatly appreciated, diuresis per cardiology -Will need sleep study as an outpatient. -Continue with Toprol-XL, Imdur and hydralazine   Atrial flutter  Continue Toprol XL On heparin GTT, can transition to Xarelto  today after reviewing general surgery note.   Chronic diastolic  CHF Echo 11/21 with preserved EF and grade 2 diastolic dysfunction Concern for IVC thrombus   History of CVA (cerebrovascular accident) Continue aspirin Xarelto currently on hold    Peripheral vascular disease  Continue aspirin, statin   Depression Continue Cymbalta, gabapentin   Tobacco use Ongoing use of close to 1 pack/day Nicotine patch offered   Malnutrition Nutrition Problem: Moderate Malnutrition Etiology: acute illness Signs/Symptoms: energy intake < 75% for > 7 days, mild muscle depletion, mild fat depletion Interventions: MVI  Hypokalemia Hypophosphatemia -replacing, monitor closely         Consultants: Surgery Cardiology Neurology PCCM IR Nutrition PT OT   Procedures: Appendectomy 11/20 Transgluteal pelvic drain placement 11/24   Antibiotics: Zosyn 11/19-    30 Day Unplanned Readmission Risk Score    Flowsheet Row ED to Hosp-Admission (Current) from 04/30/2023 in [facility]  30 Day Unplanned Readmission Risk Score (%) 21.96 Filed at 05/07/2023 0401       This score is the patient's risk of an unplanned readmission within 30 days of being discharged (0 -100%). The score is based on dignosis, age, lab data, medications, orders, and past utilization.   Low:  0-14.9   Medium: 15-21.9   High: 22-29.9   Extreme: 30 and above           Subjective: Poor solid intake, but reports tolerating liquid diet and supplements, he was encouraged to increase his supplement intake.     Objective: Vitals:   05/10/23 0800 05/10/23 1200  BP: 118/79 117/69  Pulse: 81 77  Resp: 16 15  Temp: 97.7 F (36.5 C) 98 F (36.7 C)  SpO2: 100% 100%    Intake/Output Summary (Last 24 hours) at 05/10/2023 1312 Last data filed at 05/10/2023 4696 Gross per 24 hour  Intake 15 ml  Output 1220 ml  Net -1205 ml   Filed Weights   04/30/23 2314 05/01/23 1334  Weight: 99.4 kg 99.3 kg    Exam:   Awake Alert, Oriented X 3, No new F.N  deficits, Normal affect Symmetrical Chest wall movement, Good air movement bilaterally, CTAB RRR,No Gallops,Rubs or new Murmurs, No Parasternal Heave +ve B.Sounds, Abd Soft, abdominal wound bandaged, having anterior JP drain, and posterior IR drain No Cyanosis, Clubbing or edema, No new Rash or bruise      Data Reviewed: I have reviewed the patient's lab results since admission.  Pertinent labs for today include:   CO2 33 Glucose 123 WBC 14.1 Hgb 8.9 Platelets 528     Family Communication: None present today  Disposition: Status is: Inpatient Remains inpatient appropriate because: ongoing management      Unresulted Labs (From admission, onward)     Start     Ordered   05/09/23 0500  CBC  Daily,   R     Question:  Specimen collection method  Answer:  Lab=Lab collect   05/08/23 1458   05/09/23 0500  Basic metabolic panel  Daily,   R     Question:  Specimen collection method  Answer:  Lab=Lab collect   05/08/23 1458   05/06/23 0500  Heparin level (unfractionated)  Daily,   R      05/05/23 0140             Author: Huey Bienenstock, MD 05/10/2023 1:12 PM  For on call review [email or web address].

## 2023-05-10 NOTE — Progress Notes (Addendum)
Addendum: Primary team and surgery team agree that patient is ready to switch from heparin to xarelto. Will plan to switch tonight with dinner.   PHARMACY - ANTICOAGULATION CONSULT NOTE  Pharmacy Consult for heparin  Indication:  IVC thrombus , suspected and history of Afib and CVA  Allergies  Allergen Reactions   Lisinopril Cough    Patient Measurements: Height: 6\' 3"  (190.5 cm) Weight: 99.3 kg (219 lb) IBW/kg (Calculated) : 84.5 Heparin Dosing Weight: 99.3 kg   Vital Signs: Temp: 97.7 F (36.5 C) (11/29 0800) Temp Source: Oral (11/29 0800) BP: 118/79 (11/29 0800) Pulse Rate: 81 (11/29 0800)  Labs: Recent Labs    05/08/23 0448 05/09/23 0337 05/10/23 0355  HGB 8.7* 8.3* 8.5*  HCT 30.8* 29.3* 29.7*  PLT 524* 504* 489*  HEPARINUNFRC 0.43 0.39 0.49  CREATININE 0.86 1.01 0.88    Estimated Creatinine Clearance: 94.7 mL/min (by C-G formula based on SCr of 0.88 mg/dL).  Assessment: 67 yo M with suspected PE from ECHO findings. V/Q scan neg for PE. Vague density in the IVC. Cannot rule out thrombus. Pt is s/p appendectomy with drain placement on 11/20. Pt is s/p transgluteal pelvic drain placement in IR on 11/24.  Patient has PMH Aflutter on Xarelto. Currently held (last dose 11/19) and on heparin.  Heparin level stable at 0.49, therapeutic, on heparin 1900 units/hr Hgb stable 8.5, Plts 489  11/26: RN reports that abdominal incision continuing to ooze, but serous, and not bloody. No issues with heparin gtt reported from nursing.   Goal of Therapy:  Heparin level 0.3-0.7 units/ml Monitor platelets by anticoagulation protocol: Yes   Plan:  Continue heparin infusion at 1900 units/hr Monitor daily CBC, heparin level, and for s/sx of bleeding Follow up with surgery team for clearance to resume xarelto   Thank you for allowing pharmacy to be a part of this patient's care.   Blane Ohara, PharmD, BCPS  PGY2 Pharmacy Resident     **Pharmacist phone directory can  be found on [email or web address] listed under [facility]**

## 2023-05-10 NOTE — Progress Notes (Signed)
Patient Name: Jared [name] Date of Encounter: 05/10/2023 [facility] Cardiologist: Dietrich Pates, MD   Interval Summary  .    67 yr old male with PMH of mild CAD, HFrEF with improved LVEF, HTN, PAD, HLD, paroxysmal atrial flutter on Xarleto, CVA, oxygen dependent COPD, active tobacco use.   He was admitted 11/19 for sepsis due to acute perforated appendicitis with microperforation, s/p lap appy 11/20, course complicated by AKI, A fib RVR.   Having some shortness of breath this morning.  Also with some mild chest tightness across his chest which he gets when he has wheezing.  Telemetry shows normal sinus rhythm with short bursts of PAT Vital Signs .    Vitals:   05/09/23 2112 05/10/23 0010 05/10/23 0500 05/10/23 0601  BP: 129/79 131/69 134/76 137/70  Pulse:  80 79   Resp:  20 18   Temp:  98.2 F (36.8 C) 97.9 F (36.6 C)   TempSrc:  Oral Oral   SpO2:  100% 98%   Weight:      Height:        Intake/Output Summary (Last 24 hours) at 05/10/2023 3086 Last data filed at 05/09/2023 2111 Gross per 24 hour  Intake 10 ml  Output 450 ml  Net -440 ml      05/01/2023    1:34 PM 04/30/2023   11:14 PM 04/24/2023   10:40 AM  Last 3 Weights  Weight (lbs) 219 lb 219 lb 2.2 oz 219 lb 12.8 oz  Weight (kg) 99.338 kg 99.4 kg 99.701 kg      Telemetry/ECG    TELE Personally reviewed and demonstrates normal sinus rhythm with short bursts of PAT  ECHO: 05/02/2023  1. Left ventricular ejection fraction, by estimation, is 55 to 60%. The left ventricle has normal function. The left ventricle has no regional wall motion abnormalities. Left ventricular diastolic parameters are consistent with Grade II diastolic dysfunction (pseudonormalization).   2. Right ventricular systolic function is moderately reduced. The right ventricular size is moderately enlarged. Tricuspid regurgitation signal is inadequate for assessing PA pressure.   3. Left atrial size was mildly dilated.   4. The  mitral valve is degenerative. No evidence of mitral valve  regurgitation. No evidence of mitral stenosis.   5. The aortic valve is tricuspid. Aortic valve regurgitation is not  visualized. Aortic valve sclerosis/calcification is present, without any evidence of aortic stenosis.   6. There is a vague density in the IVC. Cannot rule out thrombus.   Recommend dedicated Abdominal CTA vs. Abdominal US for further evaluation.  The inferior vena cava is dilated in size with >50% respiratory  variability, suggesting right atrial pressure of  8 mmHg.   7. In setting of syncope as well as RV dysfunction, consider acute PE.  Recommend Chest CTA if clinically indicated.    Physical Exam .    GEN: Well nourished, well developed in no acute distress HEENT: Normal NECK: No JVD; No carotid bruits LYMPHATICS: No lymphadenopathy CARDIAC:RRR, no murmurs, rubs, gallops RESPIRATORY: Fuhs expiratory wheezes throughout anteriorly and posteriorly ABDOMEN: Soft, non-tender, non-distended MUSCULOSKELETAL:  No edema; No deformity  SKIN: Warm and dry NEUROLOGIC:  Alert and oriented x 3 PSYCHIATRIC:  Normal affect  Assessment & Plan .     Paroxysmal A fib/flutter with RVR - Continues to have no A-fib on telemetry.  Had several short bursts of PAT - PTA Xarelto held >> Now on heparin  - Continue Toprol-XL 50 mg daily - transition to DOAC  when ok with surgery  HFrEF with improved LVEF RV dysfunction new this admission  - Echo 11/21 showed LVEF improved to 55-60% , no RWMA, grade II DD, RV mod reduced, RV mod enlarged, mild LAE, There is a vague density in the IVC. Cannot rule out thrombus.  - VQ scan negative for PE  - He does have a history of COPD/severe restrictive lung disease and severe COPD noted on PFTs in 2021 followed by pulmonary - Suspect primary lung disease contributing to right-sided heart failure.  There was no TR jet to determine PA pressures. -Has had problems with wheezing throughout his  hospital stay which have improved with IV diuretics and nebulizers -Seen again on exam today with some mild chest tightness that he gets whenever he starts to wheeze -I's and O's appear incomplete with documented shows he put out 470 yesterday and is net - 2.3 L -Creatinine stable at 0.88 with potassium 3.7  -Daily weight not performed -Some of his wheezing is volume overload so I am going to give him another dose of 40 mg of Lasix IV today and reassess in the a.m. -Follow strict I's and O's and daily weights while diuresing - Has never had a sleep study so would recommend outpatient PSG once he is discharged home -GDMT: Toprol XL 50 mg daily, Imdur 30 mg daily, hydralazine 50 mg every 8 hours  Nonobstructive CAD -Cath in 2012 -Ischemia on Myoview 2016 -He has had some mild chest tightness which resolves once his wheezing is gone and I do not think this is angina>> elevation in troponin with flat trend at 48, 36, 37, 40 with 2D echo this admission showing normal function although RV function moderately reduced but likely related to severe COPD/restrictive lung disease -Continue aspirin 81 mg daily, atorvastatin 80 mg daily, Zetia 10 mg daily, Toprol-XL 50 mg daily   Syncope and collapse - Patient reports 2 years of intermittent generalized body shaking with syncope and loss of consciousness that are often positional when going from sitting to standing and therefore sound orthostatic -Echo this admission shows preserved EF, grade 2 diastolic dysfunction with moderate reduction in RV function and RV dilatation and a vague density in the IVC, concerning for possible PE/IVC thrombus - VQ scan negative for PE  - Abdominal US ordered to rule out IVC thrombus but was non-diagnostic due to body habitus and air in abdominal cavity - DVT US negative - 21 beat run of NSVT on 11/23 - no further NSVT - may need to consider ILR placement once he recovers from acute illness if episodes of syncope continue  to occur  Perforated appendicitis  AKI Anemia  Syncope  Intermittent involuntary movement of arms and legs  COPD - requested RT give an extra treatment Hx of CVA - per primary team    For questions or updates, please contact [facility] Please consult [email or web address] for contact info under        Signed, Armanda Magic, MD

## 2023-05-10 NOTE — Progress Notes (Signed)
9 Days Post-Op   Subjective/Chief Complaint: Patient with less pain.  Breathing better today.   Objective: Vital signs in last 24 hours: Temp:  [97.7 F (36.5 C)-98.2 F (36.8 C)] 97.7 F (36.5 C) (11/29 0800) Pulse Rate:  [76-85] 81 (11/29 0800) Resp:  [15-20] 16 (11/29 0800) BP: (118-139)/(69-79) 118/79 (11/29 0800) SpO2:  [98 %-100 %] 100 % (11/29 0800) Last BM Date : 05/09/23  Intake/Output from previous day: 11/28 0701 - 11/29 0700 In: 15 [I.V.:15] Out: 470 [Urine:400; Drains:70] Intake/Output this shift: Total I/O In: -  Out: 800 [Urine:800]  Abdomen: Mild distention noted umbilical incision open and packed clean other port sites clean dry intact no peritonitis  Lab Results:  Recent Labs    05/09/23 0337 05/10/23 0355  WBC 10.2 10.1  HGB 8.3* 8.5*  HCT 29.3* 29.7*  PLT 504* 489*   BMET Recent Labs    05/09/23 0337 05/10/23 0355  NA 137 141  K 3.3* 3.7  CL 92* 98  CO2 38* 37*  GLUCOSE 155* 142*  BUN 8 7*  CREATININE 1.01 0.88  CALCIUM 8.2* 8.1*   PT/INR No results for input(s): "LABPROT", "INR" in the last 72 hours. ABG No results for input(s): "PHART", "HCO3" in the last 72 hours.  Invalid input(s): "PCO2", "PO2"  Studies/Results: No results found.  Anti-infectives: Anti-infectives (From admission, onward)    Start     Dose/Rate Route Frequency Ordered Stop   05/07/23 0945  piperacillin-tazobactam (ZOSYN) IVPB 3.375 g        3.375 g 12.5 mL/hr over 240 Minutes Intravenous Every 8 hours 05/07/23 0851     05/07/23 0930  piperacillin-tazobactam (ZOSYN) IVPB 3.375 g  Status:  Discontinued        3.375 g 100 mL/hr over 30 Minutes Intravenous Every 8 hours 05/07/23 0836 05/07/23 0851   04/30/23 1930  piperacillin-tazobactam (ZOSYN) IVPB 3.375 g        3.375 g 12.5 mL/hr over 240 Minutes Intravenous Every 8 hours 04/30/23 1915 05/05/23 1940   04/30/23 1145  piperacillin-tazobactam (ZOSYN) IVPB 3.375 g        3.375 g 100 mL/hr over 30  Minutes Intravenous  Once 04/30/23 1131 04/30/23 1242       Assessment/Plan: s/p Procedure(s): APPENDECTOMY LAPAROSCOPIC (N/A) POD#9 - lap appy for perforated appendicitis 11/20 - MW - s/p IR drain placement 11/24, gram stain GPC, GNR, GPR. Culture pending. Continue IV zosyn and - culture Klebsiella/E. coli noted sensitive to Zosyn.  Recommend 2 more days of IV Zosyn then discontinue - continue surgical drain - WBC  monitor.  - AKI, creatinine improving - nephrology s/o - On soft diet but not really eating, he is tolerating liquids. Encourage protein shakes. Dulcolax suppository today. Continue miralax. - oob/pulm toilet; encouraged ambulation in halls. Continue therapies - rec SNF   FEN: soft, increase bowel regimen ID: [email or web address] 11/19>> VTE: restart  xarelto. SCDs. Hep gtt -can stop once on xarelto   Syncope COPD/Chronic resp failure - on 2 lpm at baseline HTN Atrial flutter -can restart Xarelto once taking p.o.'s better H/o CVA PVD Tobacco use  LOS: 10 days    Dortha Schwalbe MD  05/10/2023

## 2023-05-11 ENCOUNTER — Inpatient Hospital Stay (HOSPITAL_COMMUNITY): Payer: Medicare HMO

## 2023-05-11 DIAGNOSIS — I5032 Chronic diastolic (congestive) heart failure: Secondary | ICD-10-CM | POA: Diagnosis not present

## 2023-05-11 DIAGNOSIS — K3532 Acute appendicitis with perforation and localized peritonitis, without abscess: Secondary | ICD-10-CM | POA: Diagnosis not present

## 2023-05-11 LAB — BASIC METABOLIC PANEL
Anion gap: 5 (ref 5–15)
BUN: 8 mg/dL (ref 8–23)
CO2: 38 mmol/L — ABNORMAL HIGH (ref 22–32)
Calcium: 8.2 mg/dL — ABNORMAL LOW (ref 8.9–10.3)
Chloride: 96 mmol/L — ABNORMAL LOW (ref 98–111)
Creatinine, Ser: 0.82 mg/dL (ref 0.61–1.24)
GFR, Estimated: >60 mL/min (ref >60)
Glucose, Bld: 145 mg/dL — ABNORMAL HIGH (ref 70–99)
Potassium: 3.7 mmol/L (ref 3.5–5.1)
Sodium: 139 mmol/L (ref 135–145)

## 2023-05-11 LAB — CBC
HCT: 31.1 % — ABNORMAL LOW (ref 39.0–52.0)
Hemoglobin: 8.9 g/dL — ABNORMAL LOW (ref 13.0–17.0)
MCH: 26.5 pg (ref 26.0–34.0)
MCHC: 28.6 g/dL — ABNORMAL LOW (ref 30.0–36.0)
MCV: 92.6 fL (ref 80.0–100.0)
Platelets: 514 10*3/uL — ABNORMAL HIGH (ref 150–400)
RBC: 3.36 MIL/uL — ABNORMAL LOW (ref 4.22–5.81)
RDW: 17.5 % — ABNORMAL HIGH (ref 11.5–15.5)
WBC: 12.2 10*3/uL — ABNORMAL HIGH (ref 4.0–10.5)
nRBC: 0 % (ref 0.0–0.2)

## 2023-05-11 LAB — GLUCOSE, CAPILLARY
Glucose-Capillary: 113 mg/dL — ABNORMAL HIGH (ref 70–99)
Glucose-Capillary: 156 mg/dL — ABNORMAL HIGH (ref 70–99)
Glucose-Capillary: 144 mg/dL — ABNORMAL HIGH (ref 70–99)

## 2023-05-11 LAB — PROCALCITONIN: Procalcitonin: 0.51 ng/mL

## 2023-05-11 LAB — MAGNESIUM: Magnesium: 1.7 mg/dL (ref 1.7–2.4)

## 2023-05-11 MED ORDER — FUROSEMIDE 10 MG/ML IJ SOLN
40.0000 mg | Freq: Once | INTRAMUSCULAR | Status: AC
  Administered 2023-05-11: 40 mg via INTRAVENOUS
  Filled 2023-05-11: qty 4

## 2023-05-11 MED ORDER — POTASSIUM CHLORIDE CRYS ER 20 MEQ PO TBCR
40.0000 meq | EXTENDED_RELEASE_TABLET | Freq: Once | ORAL | Status: AC
  Administered 2023-05-11: 40 meq via ORAL
  Filled 2023-05-11: qty 2

## 2023-05-11 NOTE — Progress Notes (Signed)
10 Days Post-Op   Subjective/Chief Complaint: Patient about the same.  Pain controlled.  Bowels are working.  Still short of breath   Objective: Vital signs in last 24 hours: Temp:  [97.8 F (36.6 C)-98 F (36.7 C)] 98 F (36.7 C) (11/30 0751) Pulse Rate:  [77-87] 78 (11/30 0751) Resp:  [15-23] 17 (11/30 0751) BP: (117-133)/(67-95) 126/67 (11/30 0751) SpO2:  [97 %-100 %] 100 % (11/30 0826) Weight:  [96.1 kg] 96.1 kg (11/30 1018) Last BM Date : 05/10/23  Intake/Output from previous day: 11/29 0701 - 11/30 0700 In: 185 [P.O.:180] Out: 1695 [Urine:1625; Drains:70] Intake/Output this shift: No intake/output data recorded.  Abdomen: Wound is open and umbilicus.  Clean.  Serous drainage noted.  Mild distention.  Other port sites clean dry intact.  JP scant and serous  Lab Results:  Recent Labs    05/10/23 0355 05/11/23 0347  WBC 10.1 12.2*  HGB 8.5* 8.9*  HCT 29.7* 31.1*  PLT 489* 514*   BMET Recent Labs    05/10/23 0355 05/11/23 0347  NA 141 139  K 3.7 3.7  CL 98 96*  CO2 37* 38*  GLUCOSE 142* 145*  BUN 7* 8  CREATININE 0.88 0.82  CALCIUM 8.1* 8.2*   PT/INR No results for input(s): "LABPROT", "INR" in the last 72 hours. ABG No results for input(s): "PHART", "HCO3" in the last 72 hours.  Invalid input(s): "PCO2", "PO2"  Studies/Results: No results found.  Anti-infectives: Anti-infectives (From admission, onward)    Start     Dose/Rate Route Frequency Ordered Stop   05/07/23 0945  piperacillin-tazobactam (ZOSYN) IVPB 3.375 g        3.375 g 12.5 mL/hr over 240 Minutes Intravenous Every 8 hours 05/07/23 0851     05/07/23 0930  piperacillin-tazobactam (ZOSYN) IVPB 3.375 g  Status:  Discontinued        3.375 g 100 mL/hr over 30 Minutes Intravenous Every 8 hours 05/07/23 0836 05/07/23 0851   04/30/23 1930  piperacillin-tazobactam (ZOSYN) IVPB 3.375 g        3.375 g 12.5 mL/hr over 240 Minutes Intravenous Every 8 hours 04/30/23 1915 05/05/23 1940    04/30/23 1145  piperacillin-tazobactam (ZOSYN) IVPB 3.375 g        3.375 g 100 mL/hr over 30 Minutes Intravenous  Once 04/30/23 1131 04/30/23 1242       Assessment/Plan: s/p Procedure(s): APPENDECTOMY LAPAROSCOPIC (N/A) Stable from a surgical standpoint.  White count with mild elevation.  Monitor for now.    LOS: 11 days    Clovis Pu Daemian Gahm 05/11/2023

## 2023-05-11 NOTE — Progress Notes (Signed)
Progress Note   Patient: Jared [name] VHQ:[id number] DOB: 1955-09-28 DOA: 04/30/2023     11 DOS: the patient was seen and examined on 05/11/2023   Brief hospital course: 67yo with h/o chronic combined CHF, HTN, CVA, afib on Xarelto, PVD, and COPD on 2L home O2 who presented on 11/19 with SOB and other complaints.  Fever 100.9, WBC 13.4, lactate 2.9, Creatinine 1.65 with AKI.  CT A?P with acute perforated appendicitis with microperforation.  Admitted for IV antibiotics, Xarelto washout.  Surgery is consulting.  Underwent lap appy on 11/20 but slow recovery.  Repeat CT with abscess and concern for peritonitis.  Underwent transgluteal pelvic drain placement on 11/24 with unresponsiveness/respiratory distress after procedure which resolved.  Assessment and Plan:  Sepsis due to acute perforated appendicitis with microperforation - lap appy on 11/20 - visible appendiceal perforation noted with abscess that was drained and fecaliths removed; surgical drain was left in place Remains on IV Zosyn Blood cultures negative x 5 days from admission Abdomen remains sore and distended with drain in LLQ Worsening leukocytosis with high risk of complications, repeat CT performed with deep abscess appreciated,, concern for peritonitis on 11/24 Went to IR for transgluteal pelvic drain placement, drain management by IR White blood cell count elevated today, will repeat tomorrow, will check procalcitonin as well.  Acute on chronic hypoxic respiratory failure Event post-procedure following prone positioning for drain placement Required NRB O2 Monitored overnight in ICU Chronic respiratory failure is secondary to COPD on 2L, currently on 4L with ongoing wheezing despite nebs PRN duoneb, Albuterol Continue Breztri He is with some dyspnea today which has improved after IV Lasix.   Delirium Patient has had periodic delirium during the hospitalization with concern for seizures at one point but negative LTM  EEG Currently with reported AV hallucinations Suspect this is multifactorial and related to ICU psychosis in conjunction with polypharmacy and infection Will add prn IV Haldol and delirium precautions Neurology previously consulted Recommend NOT increasing Oxy or gabapentin   Syncope and collapse Patient reports 2 years of intermittent generalized body shaking with syncope and loss of consciousness that are often positional when going from sitting to standing. Last echocardiogram was 12/2021 with EF of 45 to 50% and global hypokinesis, grade 1 diastolic dysfunction.  No significant valvular abnormality. Repeat echo shows preserved EF, grade 2 diastolic dysfunction with moderate reduction in RV function and RV dilatation and a vague density in the IVC, concerning for possible PE/IVC thrombus STAT VQ scan negative for PE  Abdominal US ordered to rule out IVC thrombus but was non-diagnostic due to body habitus and air in abdominal cavity DVT US negative Cardiology consulting Will continue to treat empirically with Heparin for now 21 beat run of NSVT on 11/23 Diuresis per cardiology   IDA Patient with h/o iron deficiency anemia, possibly with superimposed ABLA from surgery Receives weekly IV iron infusions, given on 11/21  Hgb improved post-iron, appears to be stable   Essential hypertension Continue hydralazine, Imdur, metoprolol Held Lasix due to recurrent AKI - will add back   HLD Continue Zetia, atorvastatin  HFrEF with improved LVEF RV dysfunction new this admission  - Echo 11/21 showed LVEF improved to 55-60% , no RWMA, grade II DD, RV mod reduced, RV mod enlarged, mild LAE, There is a vague density in the IVC. Cannot rule out thrombus.  -Cardiology input greatly appreciated, diuresis per cardiology -Will need sleep study as an outpatient. -Continue with Toprol-XL, Imdur and hydralazine   Atrial flutter  Continue Toprol XL On heparin GTT, can transition to Xarelto today after  reviewing general surgery note.   Chronic diastolic CHF Echo 11/21 with preserved EF and grade 2 diastolic dysfunction Concern for IVC thrombus   History of CVA (cerebrovascular accident) Continue aspirin Xarelto currently on hold    Peripheral vascular disease  Continue aspirin, statin   Depression Continue Cymbalta, gabapentin   Tobacco use Ongoing use of close to 1 pack/day Nicotine patch offered   Malnutrition Nutrition Problem: Moderate Malnutrition Etiology: acute illness Signs/Symptoms: energy intake < 75% for > 7 days, mild muscle depletion, mild fat depletion Interventions: MVI  Hypokalemia Hypophosphatemia -replacing, monitor closely         Consultants: Surgery Cardiology Neurology PCCM IR Nutrition PT OT   Procedures: Appendectomy 11/20 Transgluteal pelvic drain placement 11/24   Antibiotics: Zosyn 11/19-    30 Day Unplanned Readmission Risk Score    Flowsheet Row ED to Hosp-Admission (Current) from 04/30/2023 in [place] [facility]  30 Day Unplanned Readmission Risk Score (%) 21.96 Filed at 05/07/2023 0401       This score is the patient's risk of an unplanned readmission within 30 days of being discharged (0 -100%). The score is based on dignosis, age, lab data, medications, orders, and past utilization.   Low:  0-14.9   Medium: 15-21.9   High: 22-29.9   Extreme: 30 and above           Subjective:   Patient reports appetite has improved, able to tolerate supplements, he reports some shortness of breath today.  Improved after receiving Lasix.   Objective: Vitals:   05/11/23 1150 05/11/23 1402  BP: 131/76 131/75  Pulse: 96   Resp: 20   Temp: 98.3 F (36.8 C)   SpO2: 100%     Intake/Output Summary (Last 24 hours) at 05/11/2023 1440 Last data filed at 05/11/2023 1412 Gross per 24 hour  Intake 185 ml  Output 2220 ml  Net -2035 ml   Filed Weights   04/30/23 2314 05/01/23 1334 05/11/23 1018  Weight: 99.4  kg 99.3 kg 96.1 kg    Exam:   Awake Alert, Oriented X 3, No new F.N deficits, Normal affect Symmetrical Chest wall movement, Minister entry today with wheezing RRR,No Gallops,Rubs or new Murmurs, No Parasternal Heave +ve B.Sounds, Abd Soft, No tenderness, No rebound - guarding or rigidity. +ve B.Sounds, Abd Soft, abdominal wound bandaged, having anterior JP drain, and posterior IR drain No Cyanosis, Clubbing or edema, No new Rash or bruise       Family Communication: None present today  Disposition: Status is: Inpatient Remains inpatient appropriate because: ongoing management      Unresulted Labs (From admission, onward)     Start     Ordered   05/09/23 0500  CBC  Daily,   R     Question:  Specimen collection method  Answer:  Lab=Lab collect   05/08/23 1458   05/09/23 0500  Basic metabolic panel  Daily,   R     Question:  Specimen collection method  Answer:  Lab=Lab collect   05/08/23 1458             Author: Huey Bienenstock, MD 05/11/2023 2:40 PM  For on call review [email or web address].

## 2023-05-11 NOTE — Progress Notes (Signed)
Mobility Specialist Progress Note;   05/11/23 1450  Mobility  Activity Ambulated with assistance in room  Level of Assistance Contact guard assist, steadying assist  Assistive Device Other (Comment) (Furniture walk in room)  Distance Ambulated (ft) 10 ft  Activity Response Tolerated well  Mobility Referral Yes  $Mobility charge 1 Mobility  Mobility Specialist Start Time (ACUTE ONLY) 1450  Mobility Specialist Stop Time (ACUTE ONLY) 1500  Mobility Specialist Time Calculation (min) (ACUTE ONLY) 10 min   Pt received from RN in BR completing a BM. Deferred further ambulation d/t buttock pain although encouraged. Encouraged pt to sit up in chair for a bit. Required minG assistance during ambulation. Pt hanging on to sink counter while ambulating in room. Ambulated on 3LO2. Pt left in chair with all needs met, on 3LO2.   Caesar Bookman Mobility Specialist Please contact via SecureChat or Rehab Office [phone]

## 2023-05-11 NOTE — Plan of Care (Signed)
  Problem: Education: Goal: Knowledge of General Education information will improve Description Including pain rating scale, medication(s)/side effects and non-pharmacologic comfort measures Outcome: Progressing   Problem: Health Behavior/Discharge Planning: Goal: Ability to manage health-related needs will improve Outcome: Progressing   Problem: Clinical Measurements: Goal: Ability to maintain clinical measurements within normal limits will improve Outcome: Progressing Goal: Cardiovascular complication will be avoided Outcome: Progressing   

## 2023-05-11 NOTE — Plan of Care (Signed)

## 2023-05-11 NOTE — Progress Notes (Signed)
Referring Physician(s): Dr Derrell Lolling  Supervising Physician: Roanna Banning  Patient Status:  [facility] - In-pt  Chief Complaint:  Trans gluteal abscess  Appendectomy with perforated appendix  Subjective: Wheezing, states RN just gave a breathing tx. RN aware.  No complaints related to drain.  Mild elevation in WBC.    Allergies: Lisinopril  Medications: Prior to Admission medications   Medication Sig Start Date End Date Taking? Authorizing Provider  acetaminophen (TYLENOL) 500 MG tablet Take 2 tablets (1,000 mg total) by mouth every 8 (eight) hours as needed for moderate pain. 01/03/23  Yes Newlin, Enobong, MD  albuterol (VENTOLIN HFA) 108 (90 Base) MCG/ACT inhaler INHALE 2 PUFFS INTO THE LUNGS EVERY 6 (SIX) HOURS AS NEEDED FOR WHEEZING OR SHORTNESS OF BREATH. Patient taking differently: Inhale 2 puffs into the lungs daily as needed for wheezing or shortness of breath. 12/06/22  Yes [name], MD  aspirin EC 81 MG tablet Take 1 tablet (81 mg total) by mouth daily. Swallow whole. 12/07/22  Yes Leonie Douglas, MD  atorvastatin (LIPITOR) 80 MG tablet Take 1 tablet (80 mg total) by mouth daily. 02/04/23  Yes [name], MD  DULoxetine (CYMBALTA) 60 MG capsule Take 1 capsule (60 mg total) by mouth daily. For chronic leg pains 01/09/23  Yes [name], MD  ergocalciferol (DRISDOL) 1.25 MG (50000 UT) capsule Take 1 capsule (50,000 Units total) by mouth once a week. 02/26/23  Yes [name], MD  ezetimibe (ZETIA) 10 MG tablet Take 1 tablet (10 mg total) by mouth daily. 01/31/23  Yes Pricilla Riffle, MD  ferrous sulfate (FEROSUL) 325 (65 FE) MG tablet Take 1 tablet (325 mg total) by mouth daily with breakfast. 01/30/23  Yes Danis, Andreas Blower, MD  folic acid (FOLVITE) 1 MG tablet Take 1 tablet (1 mg total) by mouth daily. 01/30/23  Yes [name], MD  furosemide (LASIX) 40 MG tablet Take 1-2 tablets (40-80 mg total) by mouth 2 (two) times daily as needed.Take an extra pill with  worsening pedal edema. 01/09/23  Yes [name], MD  gabapentin (NEURONTIN) 300 MG capsule Take 1 capsule (300 mg total) by mouth 2 (two) times daily. 02/25/23  Yes Kathryne Hitch, MD  hydrALAZINE (APRESOLINE) 50 MG tablet Take 1 tablet (50 mg total) by mouth every 8 (eight) hours. 02/04/23  Yes [name], MD  isosorbide mononitrate (IMDUR) 30 MG 24 hr tablet Take 1 tablet (30 mg total) by mouth daily. 01/30/23  Yes [name], MD  metoprolol succinate (TOPROL-XL) 50 MG 24 hr tablet Take 1 tablet (50 mg total) by mouth daily. Take with or immediately following a meal. 02/04/23  Yes Newlin, Enobong, MD  nitroGLYCERIN (NITROSTAT) 0.4 MG SL tablet Place 1 tablet (0.4 mg total) under the tongue every 5 (five) minutes as needed for chest pain. 11/19/20 04/30/23 Yes Albertine Grates, MD  OXYGEN Inhale 2 L/min into the lungs continuous.   Yes [provider]  pantoprazole (PROTONIX) 40 MG tablet Take 1 tablet (40 mg total) by mouth daily. 02/04/23  Yes [name], MD  rivaroxaban (XARELTO) 20 MG TABS tablet Take 1 tablet (20 mg total) by mouth daily with supper. 10/25/22  Yes [name], MD  Accu-Chek Softclix Lancets lancets Use to check blood sugar once daily. 01/09/23   [name], MD  Blood Glucose Monitoring Suppl (ACCU-CHEK GUIDE) w/Device KIT Use to check blood sugar once daily. 01/30/23   [name], MD  Budeson-Glycopyrrol-Formoterol (BREZTRI AEROSPHERE) 160-9-4.8 MCG/ACT AERO Take 2  puffs first thing in morning and then another 2 puffs about 12 hours later. 02/25/23   Nyoka Cowden, MD  glucose blood (ACCU-CHEK GUIDE) test strip Use to check blood sugar once daily. 01/09/23   [name], MD  TUDORZA PRESSAIR 400 MCG/ACT AEPB INHALE 1 PUFF INTO THE LUNGS IN THE MORNING AND AT BEDTIME. 03/29/20 04/04/20  Nyoka Cowden, MD     Vital Signs: BP 126/67 (BP Location: Left Arm)   Pulse 78   Temp 98 F (36.7 C) (Oral)   Resp 17   Ht 6\' 3"  (1.905 m)   Wt  211 lb 13.8 oz (96.1 kg)   SpO2 100%   BMI 26.48 kg/m   Physical Exam Vitals reviewed.  Constitutional:      General: He is not in acute distress.    Appearance: He is well-developed. He is not ill-appearing.  Cardiovascular:     Rate and Rhythm: Normal rate and regular rhythm.  Pulmonary:     Effort: Pulmonary effort is normal.     Breath sounds: Wheezing present.  Abdominal:     Comments: R TG drain in place.  Site c/d/I.  Flushes easily-- small bead of flush does come back through tract.  Aspirates well.  Sero-sanguinous output in bulb which is holding suction without issue.    Musculoskeletal:        General: Normal range of motion.  Skin:    General: Skin is warm.     Comments: Site is clean and dry NT no bleeding OP blood tinged 200 cc daily  Neurological:     General: No focal deficit present.     Mental Status: He is alert.  Psychiatric:        Mood and Affect: Mood normal.        Behavior: Behavior normal.     Imaging: No results found.  Labs:  CBC: Recent Labs    05/08/23 0448 05/09/23 0337 05/10/23 0355 05/11/23 0347  WBC 11.2* 10.2 10.1 12.2*  HGB 8.7* 8.3* 8.5* 8.9*  HCT 30.8* 29.3* 29.7* 31.1*  PLT 524* 504* 489* 514*    COAGS: Recent Labs    04/30/23 1144 05/05/23 0848  INR 1.7* 1.0  APTT 45*  --     BMP: Recent Labs    05/08/23 0448 05/09/23 0337 05/10/23 0355 05/11/23 0347  NA 137 137 141 139  K 3.9 3.3* 3.7 3.7  CL 93* 92* 98 96*  CO2 32 38* 37* 38*  GLUCOSE 93 155* 142* 145*  BUN 8 8 7* 8  CALCIUM 8.4* 8.2* 8.1* 8.2*  CREATININE 0.86 1.01 0.88 0.82  GFRNONAA >60 >60 >60 >60    LIVER FUNCTION TESTS: Recent Labs    12/26/22 0939 04/30/23 1144 05/02/23 0755 05/03/23 1523 05/04/23 0550  BILITOT 0.4 1.3* 0.4 0.5  --   AST 23 32 21 36  --   ALT 21 18 14 16   --   ALKPHOS 66 72 57 100  --   PROT 6.5 7.3 6.0* 6.1*  --   ALBUMIN 3.5 3.7 2.7* 2.4* 2.2*    Assessment/Plan: Perforated appendicitis s/p lap  appendectomy with post-op abscess.  Now s/p right TG drain placement 05/05/23  Drain Location: TG drain Size: 8 Fr Date of placement: 11/24  Currently to: Drain collection device: suction bulb 24 hour output:  Output by Drain (mL) 05/09/23 0701 - 05/09/23 1900 05/09/23 1901 - 05/10/23 0700 05/10/23 0701 - 05/10/23 1900 05/10/23 1901 - 05/11/23 0700 05/11/23 0701 -  05/11/23 1057  Closed System Drain 1 Right;Posterior;Lateral Buttock Bulb (JP) 8 Fr. 30  15 10    Open Drain 1 Abdomen 19 Fr. 20 20 25 20      Interval imaging/drain manipulation:  none  Current examination: Flushes/aspirates easily.  Insertion site unremarkable. Suture and stat lock in place. Dressed appropriately.   Plan: Continue TID flushes with 5 cc NS. Record output Q shift. Dressing changes QD or PRN if soiled.  Call IR APP or on call IR MD if difficulty flushing or sudden change in drain output.   Discharge planning: Outpatient follow-up has been ordered.  Patient will need to flush drain QD with 5 cc NS, record output QD, dressing changes every 2-3 days or earlier if soiled.   IR will continue to follow - please call with questions or concerns.   Electronically Signed: Hoyt Koch, PA 05/11/2023, 10:57 AM   I spent a total of 15 Minutes at the the patient's bedside AND on the patient's hospital floor or unit, greater than 50% of which was counseling/coordinating care for TG abscess drain

## 2023-05-11 NOTE — Progress Notes (Signed)
Rounding Note    Patient Name: Jared [name] Date of Encounter: 05/11/2023  [facility] Cardiologist: Dietrich Pates, MD   Subjective   Some ongoing SOB  Inpatient Medications    Scheduled Meds:  acetaminophen  1,000 mg Oral Q6H   arformoterol  15 mcg Nebulization BID   aspirin EC  81 mg Oral Daily   atorvastatin  80 mg Oral Daily   budesonide (PULMICORT) nebulizer solution  0.5 mg Nebulization BID   Chlorhexidine Gluconate Cloth  6 each Topical Daily   docusate sodium  100 mg Oral BID   DULoxetine  60 mg Oral Daily   ezetimibe  10 mg Oral Daily   feeding supplement  1 Container Oral TID WC   feeding supplement  237 mL Oral BID BM   ferrous sulfate  325 mg Oral Q breakfast   folic acid  1 mg Oral Daily   gabapentin  300 mg Oral BID   hydrALAZINE  50 mg Oral Q8H   isosorbide mononitrate  30 mg Oral Daily   metoprolol succinate  50 mg Oral Daily   multivitamin with minerals  1 tablet Oral Daily   nicotine  21 mg Transdermal Daily   pantoprazole  40 mg Oral Daily   polyethylene glycol  17 g Oral BID   revefenacin  175 mcg Nebulization Daily   rivaroxaban  20 mg Oral Daily   sodium chloride flush  5 mL Intracatheter Q8H   Continuous Infusions:  piperacillin-tazobactam (ZOSYN)  IV 3.375 g (05/11/23 0805)   PRN Meds: albuterol, bisacodyl, haloperidol lactate, morphine injection, oxyCODONE   Vital Signs    Vitals:   05/11/23 0751 05/11/23 0824 05/11/23 0825 05/11/23 0826  BP: 126/67     Pulse: 78     Resp: 17     Temp: 98 F (36.7 C)     TempSrc: Oral     SpO2: 98% 98% 100% 100%  Weight:      Height:        Intake/Output Summary (Last 24 hours) at 05/11/2023 0905 Last data filed at 05/11/2023 0405 Gross per 24 hour  Intake 185 ml  Output 895 ml  Net -710 ml      05/01/2023    1:34 PM 04/30/2023   11:14 PM 04/24/2023   10:40 AM  Last 3 Weights  Weight (lbs) 219 lb 219 lb 2.2 oz 219 lb 12.8 oz  Weight (kg) 99.338 kg 99.4 kg 99.701 kg       Telemetry    SR, isolated 8 beat runs NSVT - Personally Reviewed  ECG    N/a - Personally Reviewed  Physical Exam   GEN: No acute distress.   Neck: mildly elevated JVD Cardiac: RRR, no murmurs, rubs, or gallops.  Respiratory: bilateral wheezing GI: Soft, nontender, non-distended  MS: No edema; No deformity. Neuro:  Nonfocal  Psych: Normal affect   Labs    High Sensitivity Troponin:   Recent Labs  Lab 04/30/23 1144 04/30/23 1317 05/02/23 1717 05/02/23 1718  TROPONINIHS 48* 36* 37* 40*     Chemistry Recent Labs  Lab 05/07/23 0249 05/08/23 0448 05/09/23 0337 05/10/23 0355 05/11/23 0347  NA 135   < > 137 141 139  K 3.9   < > 3.3* 3.7 3.7  CL 92*   < > 92* 98 96*  CO2 33*   < > 38* 37* 38*  GLUCOSE 123*   < > 155* 142* 145*  BUN 8   < > 8  7* 8  CREATININE 0.90   < > 1.01 0.88 0.82  CALCIUM 8.6*   < > 8.2* 8.1* 8.2*  MG 2.2  --   --   --   --   GFRNONAA >60   < > >60 >60 >60  ANIONGAP 10   < > 7 6 5    < > = values in this interval not displayed.    Lipids No results for input(s): "CHOL", "TRIG", "HDL", "LABVLDL", "LDLCALC", "CHOLHDL" in the last 168 hours.  Hematology Recent Labs  Lab 05/09/23 0337 05/10/23 0355 05/11/23 0347  WBC 10.2 10.1 12.2*  RBC 3.23* 3.27* 3.36*  HGB 8.3* 8.5* 8.9*  HCT 29.3* 29.7* 31.1*  MCV 90.7 90.8 92.6  MCH 25.7* 26.0 26.5  MCHC 28.3* 28.6* 28.6*  RDW 17.3* 17.4* 17.5*  PLT 504* 489* 514*   Thyroid No results for input(s): "TSH", "FREET4" in the last 168 hours.  BNP Recent Labs  Lab 05/07/23 0249  BNP 203.0*    DDimer No results for input(s): "DDIMER" in the last 168 hours.   Radiology    No results found.  Cardiac Studies     Patient Profile      Assessment & Plan    1.PAF/aflutter with RVR - she is on toprol 50mg  daily for rate control - DOAC had been on hold in setting of perforated appendix with need for procedures, now back on xarelto 20mg  daily.     2. HFimpEF/RV dysfunction - - Echo  11/21 showed LVEF improved to 55-60% , no RWMA, grade II DD, RV mod reduced, RV mod enlarged, mild LAE. TR inadequate to measure PASP - history of severe COPD and restrictive lung disease, likely group III pulm HTN.  - VQ negative this admission for PE. Consider outpatient sleep study  -incomplete I/Os this admission, from yesterday reported neg 1.5 L. Weight pending. Received IV lasix 40mg  x 1 yesterday. Renal function overall stable.Mild ongoing fluid overload, dose IV lasix 40mg  x 1 today   3. Chest pain -Cath in 2012 -Ischemia on Myoview 2016 - some chest pain this admission in setting of wheezing, volume overload.   4. Syncope - Patient reports 2 years of intermittent generalized body shaking with syncope and loss of consciousness that are often positional when going from sitting to standing and therefore sound orthostatic  - 21 beat run of NSVT on 11/23  - consider outpatient monitor if ongoing episodes at f/u   5. Perforated appendicitis/sepsis - per primary team - Went to IR for transgluteal pelvic drain placement, drain management by IR    For questions or updates, please contact [facility] Please consult [email or web address] for contact info under        Signed, Dina Rich, MD  05/11/2023, 9:05 AM

## 2023-05-12 DIAGNOSIS — K3532 Acute appendicitis with perforation and localized peritonitis, without abscess: Secondary | ICD-10-CM | POA: Diagnosis not present

## 2023-05-12 DIAGNOSIS — I5032 Chronic diastolic (congestive) heart failure: Secondary | ICD-10-CM | POA: Diagnosis not present

## 2023-05-12 LAB — BASIC METABOLIC PANEL
Anion gap: 9 (ref 5–15)
BUN: 10 mg/dL (ref 8–23)
CO2: 37 mmol/L — ABNORMAL HIGH (ref 22–32)
Calcium: 8.8 mg/dL — ABNORMAL LOW (ref 8.9–10.3)
Chloride: 95 mmol/L — ABNORMAL LOW (ref 98–111)
Creatinine, Ser: 0.94 mg/dL (ref 0.61–1.24)
GFR, Estimated: >60 mL/min (ref >60)
Glucose, Bld: 117 mg/dL — ABNORMAL HIGH (ref 70–99)
Potassium: 4.6 mmol/L (ref 3.5–5.1)
Sodium: 141 mmol/L (ref 135–145)

## 2023-05-12 LAB — CBC
HCT: 30.6 % — ABNORMAL LOW (ref 39.0–52.0)
Hemoglobin: 8.6 g/dL — ABNORMAL LOW (ref 13.0–17.0)
MCH: 26 pg (ref 26.0–34.0)
MCHC: 28.1 g/dL — ABNORMAL LOW (ref 30.0–36.0)
MCV: 92.4 fL (ref 80.0–100.0)
Platelets: 460 10*3/uL — ABNORMAL HIGH (ref 150–400)
RBC: 3.31 MIL/uL — ABNORMAL LOW (ref 4.22–5.81)
RDW: 17.6 % — ABNORMAL HIGH (ref 11.5–15.5)
WBC: 12.8 10*3/uL — ABNORMAL HIGH (ref 4.0–10.5)
nRBC: 0 % (ref 0.0–0.2)

## 2023-05-12 LAB — BRAIN NATRIURETIC PEPTIDE: B Natriuretic Peptide: 96.5 pg/mL (ref 0.0–100.0)

## 2023-05-12 LAB — GLUCOSE, CAPILLARY
Glucose-Capillary: 134 mg/dL — ABNORMAL HIGH (ref 70–99)
Glucose-Capillary: 138 mg/dL — ABNORMAL HIGH (ref 70–99)
Glucose-Capillary: 175 mg/dL — ABNORMAL HIGH (ref 70–99)
Glucose-Capillary: 142 mg/dL — ABNORMAL HIGH (ref 70–99)

## 2023-05-12 LAB — PROCALCITONIN: Procalcitonin: 0.34 ng/mL

## 2023-05-12 MED ORDER — FUROSEMIDE 10 MG/ML IJ SOLN
40.0000 mg | Freq: Once | INTRAMUSCULAR | Status: AC
  Administered 2023-05-12: 40 mg via INTRAVENOUS
  Filled 2023-05-12: qty 4

## 2023-05-12 NOTE — Progress Notes (Signed)
11 Days Post-Op   Subjective/Chief Complaint: FEELS A LITTLE BETTER    Objective: Vital signs in last 24 hours: Temp:  [97.7 F (36.5 C)-98.3 F (36.8 C)] 97.8 F (36.6 C) (12/01 0802) Pulse Rate:  [75-96] 82 (12/01 0802) Resp:  [14-20] 16 (12/01 0802) BP: (113-131)/(71-78) 131/78 (12/01 0802) SpO2:  [98 %-100 %] 98 % (12/01 0808) Weight:  [96.1 kg-97.7 kg] 97.7 kg (12/01 0500) Last BM Date : 05/10/23  Intake/Output from previous day: 11/30 0701 - 12/01 0700 In: 245 [P.O.:240] Out: 1680 [Urine:1650; Drains:30] Intake/Output this shift: No intake/output data recorded.  Abdomen: wound at umbilicus open and clean distention about the same TTP throughout abdomen  Lab Results:  Recent Labs    05/11/23 0347 05/12/23 0625  WBC 12.2* 12.8*  HGB 8.9* 8.6*  HCT 31.1* 30.6*  PLT 514* 460*   BMET Recent Labs    05/11/23 0347 05/12/23 0625  NA 139 141  K 3.7 4.6  CL 96* 95*  CO2 38* 37*  GLUCOSE 145* 117*  BUN 8 10  CREATININE 0.82 0.94  CALCIUM 8.2* 8.8*   PT/INR No results for input(s): "LABPROT", "INR" in the last 72 hours. ABG No results for input(s): "PHART", "HCO3" in the last 72 hours.  Invalid input(s): "PCO2", "PO2"  Studies/Results: DG Chest Port 1 View  Result Date: 05/11/2023 CLINICAL DATA:  Dyspnea. EXAM: PORTABLE CHEST 1 VIEW COMPARISON:  May 07, 2023. FINDINGS: Stable cardiomediastinal silhouette. Left lung is clear. Minimal right basilar subsegmental atelectasis is noted. The thorax is unremarkable. IMPRESSION: Minimal right basilar subsegmental atelectasis. Electronically Signed   By: Lupita Raider M.D.   On: 05/11/2023 15:27    Anti-infectives: Anti-infectives (From admission, onward)    Start     Dose/Rate Route Frequency Ordered Stop   05/07/23 0945  piperacillin-tazobactam (ZOSYN) IVPB 3.375 g        3.375 g 12.5 mL/hr over 240 Minutes Intravenous Every 8 hours 05/07/23 0851     05/07/23 0930  piperacillin-tazobactam (ZOSYN) IVPB  3.375 g  Status:  Discontinued        3.375 g 100 mL/hr over 30 Minutes Intravenous Every 8 hours 05/07/23 0836 05/07/23 0851   04/30/23 1930  piperacillin-tazobactam (ZOSYN) IVPB 3.375 g        3.375 g 12.5 mL/hr over 240 Minutes Intravenous Every 8 hours 04/30/23 1915 05/05/23 1940   04/30/23 1145  piperacillin-tazobactam (ZOSYN) IVPB 3.375 g        3.375 g 100 mL/hr over 30 Minutes Intravenous  Once 04/30/23 1131 04/30/23 1242       Assessment/Plan: s/p Procedure(s): APPENDECTOMY LAPAROSCOPIC (N/A) Looks better but still with mild WBC CT Monday AM If ok, can probably remove drain    LOS: 12 days    Dortha Schwalbe MD  05/12/2023

## 2023-05-12 NOTE — Progress Notes (Addendum)
Mobility Specialist Progress Note;   05/12/23 1410  Mobility  Activity Ambulated with assistance in hallway  Level of Assistance Contact guard assist, steadying assist  Assistive Device Front wheel walker  Distance Ambulated (ft) 135 ft  Activity Response Tolerated well  Mobility Referral Yes  $Mobility charge 1 Mobility  Mobility Specialist Start Time (ACUTE ONLY) 1410  Mobility Specialist Stop Time (ACUTE ONLY) 1430  Mobility Specialist Time Calculation (min) (ACUTE ONLY) 20 min   Pt agreeable to mobility with encouragement. Received pain meds prior to session. Required MinG assistance for STS and during ambulation. On 3LO2 upon arrival, however ambulated on 4LO2 to stay [facility]. No c/o during session.  Displayed some SOB once back in bed. Pt returned to bed with all needs met, on 3LO2. Alarm on.  Caesar Bookman Mobility Specialist Please contact via SecureChat or Rehab Office [phone]

## 2023-05-12 NOTE — Plan of Care (Signed)
  Problem: Education: Goal: Knowledge of General Education information will improve Description: Including pain rating scale, medication(s)/side effects and non-pharmacologic comfort measures Outcome: Progressing   Problem: Activity: Goal: Risk for activity intolerance will decrease Outcome: Progressing   Problem: Pain Management: Goal: General experience of comfort will improve Outcome: Progressing   Problem: Safety: Goal: Ability to remain free from injury will improve Outcome: Progressing

## 2023-05-12 NOTE — Progress Notes (Signed)
Rounding Note    Patient Name: Jared [name] Date of Encounter: 05/12/2023  [facility] Cardiologist: Dietrich Pates, MD   Subjective   Some ongoing SOB  Inpatient Medications    Scheduled Meds:  acetaminophen  1,000 mg Oral Q6H   arformoterol  15 mcg Nebulization BID   aspirin EC  81 mg Oral Daily   atorvastatin  80 mg Oral Daily   budesonide (PULMICORT) nebulizer solution  0.5 mg Nebulization BID   Chlorhexidine Gluconate Cloth  6 each Topical Daily   docusate sodium  100 mg Oral BID   DULoxetine  60 mg Oral Daily   ezetimibe  10 mg Oral Daily   feeding supplement  1 Container Oral TID WC   feeding supplement  237 mL Oral BID BM   ferrous sulfate  325 mg Oral Q breakfast   folic acid  1 mg Oral Daily   gabapentin  300 mg Oral BID   hydrALAZINE  50 mg Oral Q8H   isosorbide mononitrate  30 mg Oral Daily   metoprolol succinate  50 mg Oral Daily   multivitamin with minerals  1 tablet Oral Daily   nicotine  21 mg Transdermal Daily   pantoprazole  40 mg Oral Daily   polyethylene glycol  17 g Oral BID   revefenacin  175 mcg Nebulization Daily   rivaroxaban  20 mg Oral Daily   sodium chloride flush  5 mL Intracatheter Q8H   Continuous Infusions:  piperacillin-tazobactam (ZOSYN)  IV 3.375 g (05/12/23 0812)   PRN Meds: albuterol, bisacodyl, haloperidol lactate, morphine injection, oxyCODONE   Vital Signs    Vitals:   05/12/23 0500 05/12/23 0625 05/12/23 0802 05/12/23 0808  BP:  130/77 131/78   Pulse:   82   Resp:  16 16   Temp:   97.8 F (36.6 C)   TempSrc:   Oral   SpO2:   98% 98%  Weight: 97.7 kg     Height:        Intake/Output Summary (Last 24 hours) at 05/12/2023 0851 Last data filed at 05/12/2023 8295 Gross per 24 hour  Intake 245 ml  Output 1680 ml  Net -1435 ml      05/12/2023    5:00 AM 05/11/2023   10:18 AM 05/01/2023    1:34 PM  Last 3 Weights  Weight (lbs) 215 lb 6.2 oz 211 lb 13.8 oz 219 lb  Weight (kg) 97.7 kg 96.1 kg 99.338 kg       Telemetry    SR, short runs SVT - Personally Reviewed  ECG    N/a - Personally Reviewed  Physical Exam   GEN: No acute distress.   Neck: No JVD Cardiac: RRR, no murmurs, rubs, or gallops.  Respiratory: Clear to auscultation bilaterally. GI: Soft, nontender, non-distended  MS: No edema; No deformity. Neuro:  Nonfocal  Psych: Normal affect   Labs    High Sensitivity Troponin:   Recent Labs  Lab 04/30/23 1144 04/30/23 1317 05/02/23 1717 05/02/23 1718  TROPONINIHS 48* 36* 37* 40*     Chemistry Recent Labs  Lab 05/07/23 0249 05/08/23 0448 05/10/23 0355 05/11/23 0347 05/12/23 0625  NA 135   < > 141 139 141  K 3.9   < > 3.7 3.7 4.6  CL 92*   < > 98 96* 95*  CO2 33*   < > 37* 38* 37*  GLUCOSE 123*   < > 142* 145* 117*  BUN 8   < >  7* 8 10  CREATININE 0.90   < > 0.88 0.82 0.94  CALCIUM 8.6*   < > 8.1* 8.2* 8.8*  MG 2.2  --   --  1.7  --   GFRNONAA >60   < > >60 >60 >60  ANIONGAP 10   < > 6 5 9    < > = values in this interval not displayed.    Lipids No results for input(s): "CHOL", "TRIG", "HDL", "LABVLDL", "LDLCALC", "CHOLHDL" in the last 168 hours.  Hematology Recent Labs  Lab 05/10/23 0355 05/11/23 0347 05/12/23 0625  WBC 10.1 12.2* 12.8*  RBC 3.27* 3.36* 3.31*  HGB 8.5* 8.9* 8.6*  HCT 29.7* 31.1* 30.6*  MCV 90.8 92.6 92.4  MCH 26.0 26.5 26.0  MCHC 28.6* 28.6* 28.1*  RDW 17.4* 17.5* 17.6*  PLT 489* 514* 460*   Thyroid No results for input(s): "TSH", "FREET4" in the last 168 hours.  BNP Recent Labs  Lab 05/07/23 0249 05/12/23 0625  BNP 203.0* 96.5    DDimer No results for input(s): "DDIMER" in the last 168 hours.   Radiology    DG Chest Port 1 View  Result Date: 05/11/2023 CLINICAL DATA:  Dyspnea. EXAM: PORTABLE CHEST 1 VIEW COMPARISON:  May 07, 2023. FINDINGS: Stable cardiomediastinal silhouette. Left lung is clear. Minimal right basilar subsegmental atelectasis is noted. The thorax is unremarkable. IMPRESSION: Minimal right  basilar subsegmental atelectasis. Electronically Signed   By: Lupita Raider M.D.   On: 05/11/2023 15:27    Cardiac Studies    Patient Profile      Assessment & Plan    1.PAF/aflutter with RVR - he is on toprol 50mg  daily for rate control - DOAC had been on hold in setting of perforated appendix with need for procedures, now back on xarelto 20mg  daily.        2. HFimpEF/RV dysfunction - - Echo 11/21 showed LVEF improved to 55-60% , no RWMA, grade II DD, RV mod reduced, RV mod enlarged, mild LAE. TR inadequate to measure PASP - history of severe COPD and restrictive lung disease, likely group III pulm HTN.  - VQ negative this admission for PE. Consider outpatient sleep study   -incomplete I/Os this admission, from yesterday reported neg 1.4 L. Weights appear inaccurate. Received IV lasix 40mg  x 1 yesterday. Renal function overall stable. BNP 261-->97.  - appears near euvolemic, dose IV lasix 40mg  x 1 today, likely can change to oral tomorrow.       3. Chest pain -Cath in 2012 -Ischemia on Myoview 2016 - some chest pain this admission in setting of wheezing, volume overload.    4. Syncope - Patient reports 2 years of intermittent generalized body shaking with syncope and loss of consciousness that are often positional when going from sitting to standing and therefore sound orthostatic  - 21 beat run of NSVT on 11/23  - consider outpatient monitor if ongoing episodes at f/u     5. Perforated appendicitis/sepsis - per primary team - Went to IR for transgluteal pelvic drain placement, drain management by IR   6. PAD - has been on ASA, statin  For questions or updates, please contact [facility] Please consult [email or web address] for contact info under        Signed, Dina Rich, MD  05/12/2023, 8:51 AM

## 2023-05-12 NOTE — Progress Notes (Signed)
Progress Note   Patient: Jared [name] ZOX:[id number] DOB: 1955/06/19 DOA: 04/30/2023     12 DOS: the patient was seen and examined on 05/12/2023   Brief hospital course: 67yo with h/o chronic combined CHF, HTN, CVA, afib on Xarelto, PVD, and COPD on 2L home O2 who presented on 11/19 with SOB and other complaints.  Fever 100.9, WBC 13.4, lactate 2.9, Creatinine 1.65 with AKI.  CT A?P with acute perforated appendicitis with microperforation.  Admitted for IV antibiotics, Xarelto washout.  Surgery is consulting.  Underwent lap appy on 11/20 but slow recovery.  Repeat CT with abscess and concern for peritonitis.  Underwent transgluteal pelvic drain placement on 11/24 with unresponsiveness/respiratory distress after procedure which resolved.  Assessment and Plan:  Sepsis due to acute perforated appendicitis with microperforation Post laparoscopic appendectomy on 11/20 - visible appendiceal perforation noted with abscess that was drained and fecaliths removed; surgical drain was left in place Remains on IV Zosyn, leukocytosis improving but still there Blood cultures negative x 5 days from admission Worsening leukocytosis with high risk of complications, repeat CT performed with deep abscess appreciated,, concern for peritonitis on 11/24 Went to IR for transgluteal pelvic drain placement, drain management by IR Management per general surgery, likely plan to repeat CT tomorrow for further evaluation  Acute on chronic hypoxic respiratory failure Patient with respiratory event postprocedure requiring prone positioning where he did require NRB and required ICU monitoring overnight. -2 L nasal cannula at baseline, continue 3 to 4 L oxygen Chronic respiratory failure is secondary to COPD on 2L, currently on 4L with ongoing wheezing despite nebs PRN duoneb, Albuterol Continue Breztri   Delirium Improving   Syncope and collapse -Echo shows preserved EF, grade 2 diastolic dysfunction with moderate  reduction in RV function and RV dilatation and a vague density in the IVC, concerning for possible PE/IVC thrombus, Abdominal US ordered to rule out IVC thrombus but was non-diagnostic due to body habitus and air in abdominal cavity, VQ scan negative for PE , venous Dopplers negative for DVT . -On full anticoagulation in the setting of known A-fib   IDA Patient with h/o iron deficiency anemia, possibly with superimposed ABLA from surgery Receives weekly IV iron infusions, given on 11/21  Hgb improved post-iron, appears to be stable   Essential hypertension Continue hydralazine, Imdur, metoprolol   HLD Continue Zetia, atorvastatin  HFrEF with improved LVEF RV dysfunction new this admission  - Echo 11/21 showed LVEF improved to 55-60% , no RWMA, grade II DD, RV mod reduced, RV mod enlarged, mild LAE, There is a vague density in the IVC. Cannot rule out thrombus.  -Cardiology input greatly appreciated, diuresis per cardiology he received 40 mg of IV Lasix today -Will need sleep study as an outpatient. -Continue with Toprol-XL, Imdur and hydralazine   Atrial flutter  Continue Toprol XL On heparin GTT initially, now back on Xarelto as no more procedures anticipated.     History of CVA (cerebrovascular accident) Continue aspirin Xarelto currently on hold    Peripheral vascular disease  Continue aspirin, statin   Depression Continue Cymbalta, gabapentin   Tobacco use Ongoing use of close to 1 pack/day Nicotine patch offered   Malnutrition Nutrition Problem: Moderate Malnutrition Etiology: acute illness Signs/Symptoms: energy intake < 75% for > 7 days, mild muscle depletion, mild fat depletion Interventions: MVI  Hypokalemia Hypophosphatemia -replacing, monitor closely         Consultants: Surgery Cardiology Neurology PCCM IR Nutrition PT OT   Procedures: Appendectomy 11/20 Transgluteal  pelvic drain placement 11/24   Antibiotics: Zosyn 11/19-    30 Day  Unplanned Readmission Risk Score    Flowsheet Row ED to Hosp-Admission (Current) from 04/30/2023 in [place] [facility]  30 Day Unplanned Readmission Risk Score (%) 21.96 Filed at 05/07/2023 0401       This score is the patient's risk of an unplanned readmission within 30 days of being discharged (0 -100%). The score is based on dignosis, age, lab data, medications, orders, and past utilization.   Low:  0-14.9   Medium: 15-21.9   High: 22-29.9   Extreme: 30 and above           Subjective:   Patient reports appetite has been improving, reports dyspnea has improved   Objective: Vitals:   05/12/23 0808 05/12/23 1215  BP:  120/82  Pulse: 82   Resp: 15 16  Temp:  98.5 F (36.9 C)  SpO2: 98% 97%    Intake/Output Summary (Last 24 hours) at 05/12/2023 1345 Last data filed at 05/12/2023 1242 Gross per 24 hour  Intake 250 ml  Output 1730 ml  Net -1480 ml   Filed Weights   05/01/23 1334 05/11/23 1018 05/12/23 0500  Weight: 99.3 kg 96.1 kg 97.7 kg    Exam:   Awake Alert, Oriented X 3, No new F.N deficits, Normal affect Symmetrical Chest wall movement, minimal Rales at the bases RRR,No Gallops,Rubs or new Murmurs, No Parasternal Heave +ve B.Sounds, abdominal wound bandaged, having anterior JP drain, and posterior IR drain No Cyanosis, Clubbing or edema, No new Rash or bruise       Family Communication: None present today  Disposition: Status is: Inpatient Remains inpatient appropriate because: ongoing management      Unresulted Labs (From admission, onward)     Start     Ordered   05/09/23 0500  CBC  Daily,   R     Question:  Specimen collection method  Answer:  Lab=Lab collect   05/08/23 1458   05/09/23 0500  Basic metabolic panel  Daily,   R     Question:  Specimen collection method  Answer:  Lab=Lab collect   05/08/23 1458             Author: Huey Bienenstock, MD 05/12/2023 1:45 PM  For on call review [email or web address].

## 2023-05-13 ENCOUNTER — Inpatient Hospital Stay (HOSPITAL_COMMUNITY): Payer: Medicare HMO

## 2023-05-13 DIAGNOSIS — K3532 Acute appendicitis with perforation and localized peritonitis, without abscess: Secondary | ICD-10-CM | POA: Diagnosis not present

## 2023-05-13 LAB — CBC
HCT: 28.8 % — ABNORMAL LOW (ref 39.0–52.0)
Hemoglobin: 8.2 g/dL — ABNORMAL LOW (ref 13.0–17.0)
MCH: 26.4 pg (ref 26.0–34.0)
MCHC: 28.5 g/dL — ABNORMAL LOW (ref 30.0–36.0)
MCV: 92.6 fL (ref 80.0–100.0)
Platelets: 494 10*3/uL — ABNORMAL HIGH (ref 150–400)
RBC: 3.11 MIL/uL — ABNORMAL LOW (ref 4.22–5.81)
RDW: 17.6 % — ABNORMAL HIGH (ref 11.5–15.5)
WBC: 13.6 10*3/uL — ABNORMAL HIGH (ref 4.0–10.5)
nRBC: 0.1 % (ref 0.0–0.2)

## 2023-05-13 LAB — GLUCOSE, CAPILLARY
Glucose-Capillary: 108 mg/dL — ABNORMAL HIGH (ref 70–99)
Glucose-Capillary: 169 mg/dL — ABNORMAL HIGH (ref 70–99)
Glucose-Capillary: 153 mg/dL — ABNORMAL HIGH (ref 70–99)
Glucose-Capillary: 142 mg/dL — ABNORMAL HIGH (ref 70–99)

## 2023-05-13 LAB — BASIC METABOLIC PANEL
Anion gap: 8 (ref 5–15)
BUN: 12 mg/dL (ref 8–23)
CO2: 40 mmol/L — ABNORMAL HIGH (ref 22–32)
Calcium: 9 mg/dL (ref 8.9–10.3)
Chloride: 93 mmol/L — ABNORMAL LOW (ref 98–111)
Creatinine, Ser: 1.02 mg/dL (ref 0.61–1.24)
GFR, Estimated: >60 mL/min (ref >60)
Glucose, Bld: 106 mg/dL — ABNORMAL HIGH (ref 70–99)
Potassium: 4.7 mmol/L (ref 3.5–5.1)
Sodium: 141 mmol/L (ref 135–145)

## 2023-05-13 MED ORDER — SENNOSIDES-DOCUSATE SODIUM 8.6-50 MG PO TABS
2.0000 | ORAL_TABLET | Freq: Once | ORAL | Status: AC
  Administered 2023-05-13: 2 via ORAL
  Filled 2023-05-13: qty 2

## 2023-05-13 MED ORDER — FUROSEMIDE 40 MG PO TABS
40.0000 mg | ORAL_TABLET | Freq: Every day | ORAL | Status: DC
  Administered 2023-05-13 – 2023-05-21 (×9): 40 mg via ORAL
  Filled 2023-05-13 (×10): qty 1

## 2023-05-13 MED ORDER — IOHEXOL 350 MG/ML SOLN
75.0000 mL | Freq: Once | INTRAVENOUS | Status: AC | PRN
  Administered 2023-05-13: 75 mL via INTRAVENOUS

## 2023-05-13 NOTE — Progress Notes (Signed)
Physical Therapy Treatment Patient Details Name: Jared [name] MRN: [id number] DOB: 1956-01-29 Today's Date: 05/13/2023   History of Present Illness 67 y.o. male presents to [facility] 04/30/23 w/ acute L lower abdominal pain, L testicle pain, and h/a. Admitted w/ sepsis 2/2 acute perforated appendicitis s/p laparoscopic appendectomy 11/20. Echo w/ density in IVC, VQ scan negative. 11/24 developed Acute on chronic hypoxic respiratory failure, transferred to ICU. PMHx: AVM, chronic respiratory failure (2L O2 at home) CABG, CAD, CAP, CHF, COPD, CVA, HTN, A flutter, DM2.    PT Comments  Pt received in supine and agreeable to session. Pt able to perform all mobility with up to CGA for safety and SpO2 WFL on 3L throughout session. Pt continues to be limited by fatigue and DOE requiring seated rest and pursed lip breathing for recovery. Pt reports little mobility at baseline and is educated on activity progression for improved endurance. Pt continues to benefit from PT services to progress toward functional mobility goals.     If plan is discharge home, recommend the following: A little help with walking and/or transfers;A little help with bathing/dressing/bathroom;Assistance with cooking/housework;Assist for transportation;Help with stairs or ramp for entrance   Can travel by private vehicle     Yes  Equipment Recommendations  None recommended by PT    Recommendations for Other Services       Precautions / Restrictions Precautions Precautions: Fall Precaution Comments: abdominal surgery, jp drains, open drain  abdomen Restrictions Weight Bearing Restrictions: No     Mobility  Bed Mobility Overal bed mobility: Needs Assistance Bed Mobility: Supine to Sit, Sit to Supine     Supine to sit: Used rails, Contact guard Sit to supine: Contact guard assist   General bed mobility comments: increased time    Transfers Overall transfer level: Needs assistance Equipment used: Rolling walker (2  wheels), None Transfers: Sit to/from Stand Sit to Stand: Contact guard assist           General transfer comment: From low EOB with and without UE support with cues for anterior weight shift due to bracing back of LE's against the bed    Ambulation/Gait Ambulation/Gait assistance: Contact guard assist Gait Distance (Feet): 150 Feet Assistive device: Rolling walker (2 wheels) Gait Pattern/deviations: Step-through pattern, Decreased stride length, Trunk flexed Gait velocity: decr     General Gait Details: cues for upright posture and RW proximity      Balance Overall balance assessment: Needs assistance Sitting-balance support: No upper extremity supported, Feet supported Sitting balance-Leahy Scale: Fair Sitting balance - Comments: sitting EOB   Standing balance support: Bilateral upper extremity supported, During functional activity, Reliant on assistive device for balance Standing balance-Leahy Scale: Poor Standing balance comment: with RW support                            Cognition Arousal: Alert Behavior During Therapy: WFL for tasks assessed/performed Overall Cognitive Status: No family/caregiver present to determine baseline cognitive functioning                                          Exercises Other Exercises Other Exercises: x5 serial STS    General Comments General comments (skin integrity, edema, etc.): SpO2 WFL on 3L, however pt demonstrating DOE requiring increased time for recovery and seated rest      Pertinent Vitals/Pain Pain Assessment Pain  Assessment: Faces Faces Pain Scale: Hurts a little bit Pain Location: abdomen Pain Descriptors / Indicators: Operative site guarding Pain Intervention(s): Monitored during session     PT Goals (current goals can now be found in the care plan section) Acute Rehab PT Goals Patient Stated Goal: to go home PT Goal Formulation: With patient Time For Goal Achievement:  05/20/23 Progress towards PT goals: Progressing toward goals    Frequency    Min 1X/week       AM-PAC PT "6 Clicks" Mobility   Outcome Measure  Help needed turning from your back to your side while in a flat bed without using bedrails?: A Little Help needed moving from lying on your back to sitting on the side of a flat bed without using bedrails?: A Little Help needed moving to and from a bed to a chair (including a wheelchair)?: A Little Help needed standing up from a chair using your arms (e.g., wheelchair or bedside chair)?: A Little Help needed to walk in hospital room?: A Little Help needed climbing 3-5 steps with a railing? : A Lot 6 Click Score: 17    End of Session Equipment Utilized During Treatment: Oxygen Activity Tolerance: Patient tolerated treatment well;Patient limited by fatigue Patient left: in bed;with call bell/phone within reach Nurse Communication: Mobility status PT Visit Diagnosis: Unsteadiness on feet (R26.81);Repeated falls (R29.6);Other abnormalities of gait and mobility (R26.89);Muscle weakness (generalized) (M62.81)     Time: 7846-9629 PT Time Calculation (min) (ACUTE ONLY): 20 min  Charges:    $Gait Training: 8-22 mins PT General Charges $$ ACUTE PT VISIT: 1 Visit                     [name], PTA Acute Rehabilitation Services Secure Chat Preferred  [phone]    [name] 05/13/2023, 4:31 PM

## 2023-05-13 NOTE — Plan of Care (Signed)

## 2023-05-13 NOTE — TOC Progression Note (Signed)
Transition of Care [facility]) - Progression Note    Patient Details  Name: Jared [name] MRN: [id number] Date of Birth: 1956-05-21  Transition of Care [facility]) CM/SW Contact  Mearl Latin, LCSW Phone Number: 05/13/2023, 3:04 PM  Clinical Narrative:    3:04 PM-CSW left voicemail for patient's sister to obtain SNF facility choice.    Expected Discharge Plan: Skilled Nursing Facility Barriers to Discharge: Continued Medical Work up, English as a second language teacher  Expected Discharge Plan and Services In-house Referral: Clinical Social Work Discharge Planning Services: [employer] Consult Post Acute Care Choice: Skilled Nursing Facility Living arrangements for the past 2 months: Single Family Home                 DME Arranged: N/A         HH Arranged: NA           Social Determinants of Health (SDOH) Interventions SDOH Screenings   Food Insecurity: No Food Insecurity (04/30/2023)  Housing: Low Risk  (04/30/2023)  Transportation Needs: No Transportation Needs (04/30/2023)  Utilities: Not At Risk (04/30/2023)  Alcohol Screen: Low Risk  (02/06/2023)  Depression (PHQ2-9): Low Risk  (02/04/2023)  Recent Concern: Depression (PHQ2-9) - High Risk (01/09/2023)  Financial Resource Strain: Medium Risk (02/06/2023)  Physical Activity: Insufficiently Active (02/06/2023)  Social Connections: Moderately Integrated (02/06/2023)  Stress: No Stress Concern Present (02/06/2023)  Tobacco Use: High Risk (05/05/2023)  Health Literacy: Adequate Health Literacy (02/06/2023)    Readmission Risk Interventions    12/27/2022    2:20 PM 12/14/2021    1:38 PM 02/01/2021    3:22 PM  Readmission Risk Prevention Plan  Post Dischage Appt Complete Complete   Medication Screening Complete Complete   Transportation Screening Complete Complete Complete  PCP or Specialist Appt within 5-7 Days   Complete  Home Care Screening   Complete  Medication Review (RN CM)   Complete

## 2023-05-13 NOTE — Progress Notes (Signed)
Interventional Radiology Brief Note:  CT Abdomen Pelvis obtained overnight and reviewed by Dr. Miles Costain.  Drain in place with improvement in collection, although may be slightly retracted.  Output has been consistently 10-3mL daily.  Will place order for IR drain evaluation and review with IR attending tomorrow for drain injection vs. Upsize if needed.  NPO p MN.   Loyce Dys, MS RD PA-C

## 2023-05-13 NOTE — Progress Notes (Signed)
Patient ID: Jared [name], male   DOB: 14-Apr-1956, 66 y.o.   MRN: [id number] [facility] Surgery Progress Note  12 Days Post-Op  Subjective: CC-  Abdominal pain well controlled. Denies n/v. Tolerating diet but not eating much. Drank Ensure x1 yesterday. Passing flatus, last BM 2 days ago. WBC up 13.6, afebrile.  Objective: Vital signs in last 24 hours: Temp:  [97.9 F (36.6 C)-98.5 F (36.9 C)] 98 F (36.7 C) (12/02 0832) Pulse Rate:  [77-94] 82 (12/02 0832) Resp:  [12-20] 15 (12/02 0832) BP: (116-128)/(59-82) 128/70 (12/02 0832) SpO2:  [96 %-100 %] 100 % (12/02 0832) Last BM Date : 05/10/23  Intake/Output from previous day: 12/01 0701 - 12/02 0700 In: 20  Out: 1435 [Urine:1400; Drains:35] Intake/Output this shift: No intake/output data recorded.  PE: Gen:  Alert, NAD Pulm:  slight increased work of breathing on supplemental oxygen via [place] Abd: soft, mild distension, bowel sounds present, wound at umbilicus open and clean without drainage, mild generalized tenderness without peritonitis. Left sided surgical drain serosanguinous, IR transgluteal drain serosanguinous  Lab Results:  Recent Labs    05/12/23 0625 05/13/23 0459  WBC 12.8* 13.6*  HGB 8.6* 8.2*  HCT 30.6* 28.8*  PLT 460* 494*   BMET Recent Labs    05/12/23 0625 05/13/23 0459  NA 141 141  K 4.6 4.7  CL 95* 93*  CO2 37* 40*  GLUCOSE 117* 106*  BUN 10 12  CREATININE 0.94 1.02  CALCIUM 8.8* 9.0   PT/INR No results for input(s): "LABPROT", "INR" in the last 72 hours. CMP     Component Value Date/Time   NA 141 05/13/2023 0459   NA 142 10/25/2022 1027   K 4.7 05/13/2023 0459   CL 93 (L) 05/13/2023 0459   CO2 40 (H) 05/13/2023 0459   GLUCOSE 106 (H) 05/13/2023 0459   BUN 12 05/13/2023 0459   BUN 14 10/25/2022 1027   CREATININE 1.02 05/13/2023 0459   CREATININE 1.20 07/30/2016 1431   CALCIUM 9.0 05/13/2023 0459   PROT 6.1 (L) 05/03/2023 1523   PROT 6.7 10/25/2022 1027   ALBUMIN 2.2 (L)  05/04/2023 0550   ALBUMIN 4.4 10/25/2022 1027   AST 36 05/03/2023 1523   ALT 16 05/03/2023 1523   ALKPHOS 100 05/03/2023 1523   BILITOT 0.5 05/03/2023 1523   BILITOT 0.3 10/25/2022 1027   GFRNONAA >60 05/13/2023 0459   GFRNONAA 64 07/30/2016 1431   GFRAA 91 01/29/2020 1020   GFRAA 74 07/30/2016 1431   Lipase     Component Value Date/Time   LIPASE 41 09/20/2021 1620       Studies/Results: DG Chest Port 1 View  Result Date: 05/11/2023 CLINICAL DATA:  Dyspnea. EXAM: PORTABLE CHEST 1 VIEW COMPARISON:  May 07, 2023. FINDINGS: Stable cardiomediastinal silhouette. Left lung is clear. Minimal right basilar subsegmental atelectasis is noted. The thorax is unremarkable. IMPRESSION: Minimal right basilar subsegmental atelectasis. Electronically Signed   By: Lupita Raider M.D.   On: 05/11/2023 15:27    Anti-infectives: Anti-infectives (From admission, onward)    Start     Dose/Rate Route Frequency Ordered Stop   05/07/23 0945  piperacillin-tazobactam (ZOSYN) IVPB 3.375 g        3.375 g 12.5 mL/hr over 240 Minutes Intravenous Every 8 hours 05/07/23 0851     05/07/23 0930  piperacillin-tazobactam (ZOSYN) IVPB 3.375 g  Status:  Discontinued        3.375 g 100 mL/hr over 30 Minutes Intravenous Every 8 hours 05/07/23 0836 05/07/23  6578   04/30/23 1930  piperacillin-tazobactam (ZOSYN) IVPB 3.375 g        3.375 g 12.5 mL/hr over 240 Minutes Intravenous Every 8 hours 04/30/23 1915 05/05/23 1940   04/30/23 1145  piperacillin-tazobactam (ZOSYN) IVPB 3.375 g        3.375 g 100 mL/hr over 30 Minutes Intravenous  Once 04/30/23 1131 04/30/23 1242        Assessment/Plan POD#12 - lap appy for perforated appendicitis 11/20 - MW - s/p IR drain placement 11/24, culture E coli and Klebsiella pneumoniae. On zosyn. Drain with 10cc out last 24hr. WBC slightly up at 13.6, planning repeat CT scan today 12/2 - continue surgical drain, currently serosanguinous - tolerating soft diet and having  bowel function. Not eating a lot, encourage protein shakes - oob/pulm toilet; encouraged ambulation in halls. Continue therapies - rec SNF   FEN: soft, Boost/Ensure ID: [email or web address] 11/19>> VTE: xarelto   AKI, creatinine improving - nephrology s/o Syncope COPD/Chronic resp failure - on 2 lpm at baseline HTN Atrial flutter - xarelto restarted 11/29 H/o CVA PVD Tobacco use    LOS: 13 days    Franne Forts, [facility] 05/13/2023, 8:53 AM Please see Amion for pager number during day hours 7:00am-4:30pm

## 2023-05-13 NOTE — Progress Notes (Signed)
Rounding Note    Patient Name: Jared [name] Date of Encounter: 05/13/2023  [facility] Cardiologist: Dietrich Pates, MD   Subjective   Taking some PO drain in place still   Inpatient Medications    Scheduled Meds:  acetaminophen  1,000 mg Oral Q6H   arformoterol  15 mcg Nebulization BID   aspirin EC  81 mg Oral Daily   atorvastatin  80 mg Oral Daily   budesonide (PULMICORT) nebulizer solution  0.5 mg Nebulization BID   Chlorhexidine Gluconate Cloth  6 each Topical Daily   docusate sodium  100 mg Oral BID   DULoxetine  60 mg Oral Daily   ezetimibe  10 mg Oral Daily   feeding supplement  1 Container Oral TID WC   feeding supplement  237 mL Oral BID BM   ferrous sulfate  325 mg Oral Q breakfast   folic acid  1 mg Oral Daily   gabapentin  300 mg Oral BID   hydrALAZINE  50 mg Oral Q8H   isosorbide mononitrate  30 mg Oral Daily   metoprolol succinate  50 mg Oral Daily   multivitamin with minerals  1 tablet Oral Daily   nicotine  21 mg Transdermal Daily   pantoprazole  40 mg Oral Daily   polyethylene glycol  17 g Oral BID   revefenacin  175 mcg Nebulization Daily   rivaroxaban  20 mg Oral Daily   sodium chloride flush  5 mL Intracatheter Q8H   Continuous Infusions:  piperacillin-tazobactam (ZOSYN)  IV 3.375 g (05/13/23 0034)   PRN Meds: albuterol, bisacodyl, haloperidol lactate, morphine injection, oxyCODONE   Vital Signs    Vitals:   05/12/23 2339 05/13/23 0400 05/13/23 0455 05/13/23 0832  BP: (!) 125/59 127/68 127/68 128/70  Pulse: 84 77  82  Resp: 12 19  15   Temp: 97.9 F (36.6 C) 98 F (36.7 C)  98 F (36.7 C)  TempSrc: Oral Oral  Oral  SpO2: 100% 99%  100%  Weight:      Height:        Intake/Output Summary (Last 24 hours) at 05/13/2023 0857 Last data filed at 05/13/2023 0400 Gross per 24 hour  Intake 20 ml  Output 1435 ml  Net -1415 ml      05/12/2023    5:00 AM 05/11/2023   10:18 AM 05/01/2023    1:34 PM  Last 3 Weights  Weight (lbs)  215 lb 6.2 oz 211 lb 13.8 oz 219 lb  Weight (kg) 97.7 kg 96.1 kg 99.338 kg      Telemetry    SR, short runs SVT - Personally Reviewed  ECG    N/a - Personally Reviewed  Physical Exam   Black male no distress Lungs exp wheezing COPD  No murmur  Large dressing in abdomen with JP drain in place Plus one bilateral edema  Labs    High Sensitivity Troponin:   Recent Labs  Lab 04/30/23 1144 04/30/23 1317 05/02/23 1717 05/02/23 1718  TROPONINIHS 48* 36* 37* 40*     Chemistry Recent Labs  Lab 05/07/23 0249 05/08/23 0448 05/11/23 0347 05/12/23 0625 05/13/23 0459  NA 135   < > 139 141 141  K 3.9   < > 3.7 4.6 4.7  CL 92*   < > 96* 95* 93*  CO2 33*   < > 38* 37* 40*  GLUCOSE 123*   < > 145* 117* 106*  BUN 8   < > 8 10 12  CREATININE 0.90   < > 0.82 0.94 1.02  CALCIUM 8.6*   < > 8.2* 8.8* 9.0  MG 2.2  --  1.7  --   --   GFRNONAA >60   < > >60 >60 >60  ANIONGAP 10   < > 5 9 8    < > = values in this interval not displayed.    Lipids No results for input(s): "CHOL", "TRIG", "HDL", "LABVLDL", "LDLCALC", "CHOLHDL" in the last 168 hours.  Hematology Recent Labs  Lab 05/11/23 0347 05/12/23 0625 05/13/23 0459  WBC 12.2* 12.8* 13.6*  RBC 3.36* 3.31* 3.11*  HGB 8.9* 8.6* 8.2*  HCT 31.1* 30.6* 28.8*  MCV 92.6 92.4 92.6  MCH 26.5 26.0 26.4  MCHC 28.6* 28.1* 28.5*  RDW 17.5* 17.6* 17.6*  PLT 514* 460* 494*   Thyroid No results for input(s): "TSH", "FREET4" in the last 168 hours.  BNP Recent Labs  Lab 05/07/23 0249 05/12/23 0625  BNP 203.0* 96.5    DDimer No results for input(s): "DDIMER" in the last 168 hours.   Radiology    DG Chest Port 1 View  Result Date: 05/11/2023 CLINICAL DATA:  Dyspnea. EXAM: PORTABLE CHEST 1 VIEW COMPARISON:  May 07, 2023. FINDINGS: Stable cardiomediastinal silhouette. Left lung is clear. Minimal right basilar subsegmental atelectasis is noted. The thorax is unremarkable. IMPRESSION: Minimal right basilar subsegmental  atelectasis. Electronically Signed   By: Lupita Raider M.D.   On: 05/11/2023 15:27    Cardiac Studies    Patient Profile      Assessment & Plan    1.PAF/aflutter with RVR - he is on toprol 50mg  daily for rate control - Back on xarelto 20mg  daily.    2. HFimpEF/RV dysfunction - - Echo 11/21 showed LVEF improved to 55-60% , no RWMA, grade II DD, RV mod reduced, RV mod enlarged, mild LAE. TR inadequate to measure PASP - history of severe COPD and restrictive lung disease, likely group III pulm HTN.  - VQ negative this admission for PE. Consider outpatient sleep study   BNP normalized good diuresis Nees nebs for COPD wrote for oral lasix 40 mg daily    3. Chest pain -Cath in 2012 -Ischemia on Myoview 2016 - some chest pain this admission in setting of wheezing, volume overload.    4. Syncope - Patient reports 2 years of intermittent generalized body shaking with syncope and loss of consciousness that are often positional when going from sitting to standing and therefore sound orthostatic  - 21 beat run of NSVT on 11/23  - consider outpatient monitor if ongoing episodes at f/u     5. Perforated appendicitis/sepsis - per primary team - Went to IR for transgluteal pelvic drain placement, drain management by IR anemic and WBC still elevated On Zosyn for antibiotics   6. PAD - has been on ASA, statin  For questions or updates, please contact [facility] Please consult [email or web address] for contact info under        Signed, Charlton Haws, MD  05/13/2023, 8:57 AM

## 2023-05-13 NOTE — Progress Notes (Signed)
Mobility Specialist Progress Note;   05/13/23 1115  Mobility  Activity Transferred from bed to chair  Level of Assistance Contact guard assist, steadying assist  Assistive Device Other (Comment) (HHA)  Activity Response Tolerated well  Mobility Referral Yes  $Mobility charge 1 Mobility  Mobility Specialist Start Time (ACUTE ONLY) 1115  Mobility Specialist Stop Time (ACUTE ONLY) 1125  Mobility Specialist Time Calculation (min) (ACUTE ONLY) 10 min   Pt agreeable to mobility. Required MinG assistance for transfer from bed to chair. VSS on 3LO2. No c/o during session. Pt left in bed with all needs met, alarm on.  Caesar Bookman Mobility Specialist Please contact via SecureChat or Rehab Office [phone]

## 2023-05-13 NOTE — Progress Notes (Signed)
Progress Note   Patient: Jared [name] VWU:[id number] DOB: December 10, 1955 DOA: 04/30/2023     13 DOS: the patient was seen and examined on 05/13/2023   Brief hospital course: 67yo with h/o chronic combined CHF, HTN, CVA, afib on Xarelto, PVD, and COPD on 2L home O2 who presented on 11/19 with SOB and other complaints.  Fever 100.9, WBC 13.4, lactate 2.9, Creatinine 1.65 with AKI.  CT A?P with acute perforated appendicitis with microperforation.  Admitted for IV antibiotics, Xarelto washout.  Surgery is consulting.  Underwent lap appy on 11/20 but slow recovery.  Repeat CT with abscess and concern for peritonitis.  Underwent transgluteal pelvic drain placement on 11/24 with unresponsiveness/respiratory distress after procedure which resolved.  Assessment and Plan:  Sepsis due to acute perforated appendicitis with microperforation Post laparoscopic appendectomy on 11/20 - visible appendiceal perforation noted with abscess that was drained and fecaliths removed; surgical drain was left in place Remains on IV Zosyn, leukocytosis improving but still there Blood cultures negative x 5 days from admission Worsening leukocytosis with high risk of complications, repeat CT performed with deep abscess appreciated,, concern for peritonitis on 11/24 Went to IR for transgluteal pelvic drain placement, drain management by IR Management per general surgery, plan for CT abdomen pelvis today.  Acute on chronic hypoxic respiratory failure Patient with respiratory event postprocedure requiring prone positioning where he did require NRB and required ICU monitoring overnight. -2 L nasal cannula at baseline, continue 3 to 4 L oxygen Chronic respiratory failure is secondary to COPD on 2L, currently on 4L with ongoing wheezing despite nebs PRN duoneb, Albuterol Continue Breztri   Delirium Improving   Syncope and collapse -Echo shows preserved EF, grade 2 diastolic dysfunction with moderate reduction in RV function and  RV dilatation and a vague density in the IVC, concerning for possible PE/IVC thrombus, Abdominal US ordered to rule out IVC thrombus but was non-diagnostic due to body habitus and air in abdominal cavity, VQ scan negative for PE , venous Dopplers negative for DVT . -On full anticoagulation in the setting of known A-fib   IDA Patient with h/o iron deficiency anemia, possibly with superimposed ABLA from surgery Receives weekly IV iron infusions, given on 11/21  Hgb improved post-iron, appears to be stable   Essential hypertension Continue hydralazine, Imdur, metoprolol   HLD Continue Zetia, atorvastatin  HFrEF with improved LVEF RV dysfunction new this admission  - Echo 11/21 showed LVEF improved to 55-60% , no RWMA, grade II DD, RV mod reduced, RV mod enlarged, mild LAE, There is a vague density in the IVC. Cannot rule out thrombus.  -Cardiology input greatly appreciated, diuresis per cardiology he received 40 mg of IV Lasix today -Will need sleep study as an outpatient. -Continue with Toprol-XL, Imdur and hydralazine   Atrial flutter  Continue Toprol XL On heparin GTT initially, now back on Xarelto as no more procedures anticipated.     History of CVA (cerebrovascular accident) Continue aspirin Xarelto currently on hold    Peripheral vascular disease  Continue aspirin, statin   Depression Continue Cymbalta, gabapentin   Tobacco use Ongoing use of close to 1 pack/day Nicotine patch offered   Malnutrition Nutrition Problem: Moderate Malnutrition Etiology: acute illness Signs/Symptoms: energy intake < 75% for > 7 days, mild muscle depletion, mild fat depletion Interventions: MVI  Hypokalemia Hypophosphatemia -replacing, monitor closely         Consultants: Surgery Cardiology Neurology PCCM IR Nutrition PT OT   Procedures: Appendectomy 11/20 Transgluteal pelvic drain placement  11/24   Antibiotics: Zosyn 11/19-    30 Day Unplanned Readmission Risk  Score    Flowsheet Row ED to Hosp-Admission (Current) from 04/30/2023 in [place] [facility] Medical ICU  30 Day Unplanned Readmission Risk Score (%) 21.96 Filed at 05/07/2023 0401       This score is the patient's risk of an unplanned readmission within 30 days of being discharged (0 -100%). The score is based on dignosis, age, lab data, medications, orders, and past utilization.   Low:  0-14.9   Medium: 15-21.9   High: 22-29.9   Extreme: 30 and above           Subjective:   Reports he is feeling little bit worse today, but he still denies any nausea, vomiting, no BM yesterday as his laxatives were held yesterday for diarrhea the day before, but his p.o. has improved.   Objective: Vitals:   05/13/23 0832 05/13/23 1219  BP: 128/70 119/71  Pulse: 82 (!) 101  Resp: 15 18  Temp: 98 F (36.7 C) (!) 97.2 F (36.2 C)  SpO2: 100% 96%    Intake/Output Summary (Last 24 hours) at 05/13/2023 1241 Last data filed at 05/13/2023 1014 Gross per 24 hour  Intake 15 ml  Output 1735 ml  Net -1720 ml   Filed Weights   05/01/23 1334 05/11/23 1018 05/12/23 0500  Weight: 99.3 kg 96.1 kg 97.7 kg    Exam:   Awake Alert, Oriented X 3, frail. Symmetrical Chest wall movement, minimal wheezing. RRR,No Gallops,Rubs or new Murmurs, No Parasternal Heave +ve B.Sounds, rectal wound bandaged, has JP drain, and IR transgluteal drain present both with serosanguineous output. No Cyanosis, Clubbing or edema, No new Rash or bruise       Family Communication: None present today  Disposition: Status is: Inpatient Remains inpatient appropriate because: ongoing management      Unresulted Labs (From admission, onward)    None        Author: Huey Bienenstock, MD 05/13/2023 12:41 PM  For on call review [email or web address].

## 2023-05-14 ENCOUNTER — Inpatient Hospital Stay (HOSPITAL_COMMUNITY): Payer: Medicare HMO

## 2023-05-14 DIAGNOSIS — I480 Paroxysmal atrial fibrillation: Secondary | ICD-10-CM | POA: Diagnosis not present

## 2023-05-14 DIAGNOSIS — K3532 Acute appendicitis with perforation and localized peritonitis, without abscess: Secondary | ICD-10-CM | POA: Diagnosis not present

## 2023-05-14 HISTORY — PX: IR CATHETER TUBE CHANGE: IMG717

## 2023-05-14 HISTORY — PX: IR SINUS/FIST TUBE CHK-NON GI: IMG673

## 2023-05-14 LAB — PHOSPHORUS: Phosphorus: 3.5 mg/dL (ref 2.5–4.6)

## 2023-05-14 LAB — BASIC METABOLIC PANEL
Anion gap: 7 (ref 5–15)
BUN: 11 mg/dL (ref 8–23)
CO2: 37 mmol/L — ABNORMAL HIGH (ref 22–32)
Calcium: 8.5 mg/dL — ABNORMAL LOW (ref 8.9–10.3)
Chloride: 94 mmol/L — ABNORMAL LOW (ref 98–111)
Creatinine, Ser: 0.75 mg/dL (ref 0.61–1.24)
GFR, Estimated: >60 mL/min (ref >60)
Glucose, Bld: 143 mg/dL — ABNORMAL HIGH (ref 70–99)
Potassium: 4.6 mmol/L (ref 3.5–5.1)
Sodium: 138 mmol/L (ref 135–145)

## 2023-05-14 LAB — CBC
HCT: 26.3 % — ABNORMAL LOW (ref 39.0–52.0)
Hemoglobin: 7.4 g/dL — ABNORMAL LOW (ref 13.0–17.0)
MCH: 25.9 pg — ABNORMAL LOW (ref 26.0–34.0)
MCHC: 28.1 g/dL — ABNORMAL LOW (ref 30.0–36.0)
MCV: 92 fL (ref 80.0–100.0)
Platelets: 470 10*3/uL — ABNORMAL HIGH (ref 150–400)
RBC: 2.86 MIL/uL — ABNORMAL LOW (ref 4.22–5.81)
RDW: 17.6 % — ABNORMAL HIGH (ref 11.5–15.5)
WBC: 12.2 10*3/uL — ABNORMAL HIGH (ref 4.0–10.5)
nRBC: 0 % (ref 0.0–0.2)

## 2023-05-14 LAB — GLUCOSE, CAPILLARY
Glucose-Capillary: 137 mg/dL — ABNORMAL HIGH (ref 70–99)
Glucose-Capillary: 170 mg/dL — ABNORMAL HIGH (ref 70–99)
Glucose-Capillary: 180 mg/dL — ABNORMAL HIGH (ref 70–99)
Glucose-Capillary: 144 mg/dL — ABNORMAL HIGH (ref 70–99)

## 2023-05-14 MED ORDER — LIDOCAINE HCL 1 % IJ SOLN
INTRAMUSCULAR | Status: AC
Start: 2023-05-14 — End: ?
  Filled 2023-05-14: qty 20

## 2023-05-14 MED ORDER — MIDAZOLAM HCL 2 MG/2ML IJ SOLN
INTRAMUSCULAR | Status: AC
  Filled 2023-05-14: qty 2

## 2023-05-14 MED ORDER — MIDAZOLAM HCL 2 MG/2ML IJ SOLN
INTRAMUSCULAR | Status: AC | PRN
  Administered 2023-05-14: 1 mg via INTRAVENOUS

## 2023-05-14 MED ORDER — BISACODYL 10 MG RE SUPP: 10.0000 mg | Freq: Once | RECTAL | Status: DC

## 2023-05-14 MED ORDER — FENTANYL CITRATE (PF) 100 MCG/2ML IJ SOLN
INTRAMUSCULAR | Status: AC
  Filled 2023-05-14: qty 2

## 2023-05-14 MED ORDER — SODIUM CHLORIDE 0.9 % IV SOLN
3.0000 g | Freq: Four times a day (QID) | INTRAVENOUS | Status: DC
  Administered 2023-05-14 – 2023-05-16 (×7): 3 g via INTRAVENOUS
  Filled 2023-05-14 (×7): qty 8

## 2023-05-14 MED ORDER — IOHEXOL 300 MG/ML  SOLN
50.0000 mL | Freq: Once | INTRAMUSCULAR | Status: AC | PRN
  Administered 2023-05-14: 20 mL

## 2023-05-14 MED ORDER — FENTANYL CITRATE (PF) 100 MCG/2ML IJ SOLN
INTRAMUSCULAR | Status: AC | PRN
  Administered 2023-05-14 (×2): 25 ug via INTRAVENOUS

## 2023-05-14 MED ORDER — LIDOCAINE HCL 1 % IJ SOLN
20.0000 mL | Freq: Once | INTRAMUSCULAR | Status: AC
  Administered 2023-05-14: 10 mL
  Filled 2023-05-14: qty 20

## 2023-05-14 MED ORDER — LIDOCAINE HCL 1 % IJ SOLN
INTRAMUSCULAR | Status: AC
  Filled 2023-05-14: qty 20

## 2023-05-14 NOTE — Progress Notes (Signed)
OT Cancellation Note  Patient Details Name: Jared [name] MRN: [id number] DOB: Jul 07, 1955   Cancelled Treatment:    Reason Eval/Treat Not Completed: (P) Patient declined, no reason specified, Pt just got back from IR, requested to return tomorrow.  Alexis Goodell 05/14/2023, 3:28 PM

## 2023-05-14 NOTE — Progress Notes (Signed)
Zosyn 11/19 > 11/24; 11/26 >>12/3 Unasyn 12/3>>  11/19 Bld cx: ng (F) 11/24 pelvic abscess cx: pan sens e.coli except amp, pan sens kleb except amp 11/24 MRSA PCR: not detected  Ok to change zosyn to Unasyn 3g IV q6 per [employer].  Ulyses Southward, PharmD, BCIDP, AAHIVP, CPP Infectious Disease Pharmacist 05/14/2023 9:59 AM

## 2023-05-14 NOTE — TOC Progression Note (Signed)
Transition of Care [facility]) - Progression Note    Patient Details  Name: Jared [name] MRN: [id number] Date of Birth: 06-21-1955  [facility]) CM/SW Contact  Mearl Latin, LCSW Phone Number: 05/14/2023, 11:51 AM  Clinical Narrative:    CSW received return call from patient's sister. She stated she is going to call [facility] to se if she can tour.   Expected Discharge Plan: Skilled Nursing Facility Barriers to Discharge: Continued Medical Work up, English as a second language teacher  Expected Discharge Plan and Services In-house Referral: Clinical Social Work Discharge Planning Services: [employer] Consult Post Acute Care Choice: Skilled Nursing Facility Living arrangements for the past 2 months: Single Family Home                 DME Arranged: N/A         HH Arranged: NA           Social Determinants of Health (SDOH) Interventions SDOH Screenings   Food Insecurity: No Food Insecurity (04/30/2023)  Housing: Low Risk  (04/30/2023)  Transportation Needs: No Transportation Needs (04/30/2023)  Utilities: Not At Risk (04/30/2023)  Alcohol Screen: Low Risk  (02/06/2023)  Depression (PHQ2-9): Low Risk  (02/04/2023)  Recent Concern: Depression (PHQ2-9) - High Risk (01/09/2023)  Financial Resource Strain: Medium Risk (02/06/2023)  Physical Activity: Insufficiently Active (02/06/2023)  Social Connections: Moderately Integrated (02/06/2023)  Stress: No Stress Concern Present (02/06/2023)  Tobacco Use: High Risk (05/05/2023)  Health Literacy: Adequate Health Literacy (02/06/2023)    Readmission Risk Interventions    12/27/2022    2:20 PM 12/14/2021    1:38 PM 02/01/2021    3:22 PM  Readmission Risk Prevention Plan  Post Dischage Appt Complete Complete   Medication Screening Complete Complete   Transportation Screening Complete Complete Complete  PCP or Specialist Appt within 5-7 Days   Complete  Home Care Screening   Complete  Medication Review (RN CM)   Complete

## 2023-05-14 NOTE — Progress Notes (Signed)
Rounding Note    Patient Name: Jared [name] Date of Encounter: 05/14/2023  [facility] Cardiologist: Dietrich Pates, MD   Subjective   Taking some PO drain in place still   Inpatient Medications    Scheduled Meds:  acetaminophen  1,000 mg Oral Q6H   arformoterol  15 mcg Nebulization BID   aspirin EC  81 mg Oral Daily   atorvastatin  80 mg Oral Daily   budesonide (PULMICORT) nebulizer solution  0.5 mg Nebulization BID   Chlorhexidine Gluconate Cloth  6 each Topical Daily   docusate sodium  100 mg Oral BID   DULoxetine  60 mg Oral Daily   ezetimibe  10 mg Oral Daily   feeding supplement  1 Container Oral TID WC   feeding supplement  237 mL Oral BID BM   ferrous sulfate  325 mg Oral Q breakfast   folic acid  1 mg Oral Daily   furosemide  40 mg Oral Daily   gabapentin  300 mg Oral BID   hydrALAZINE  50 mg Oral Q8H   isosorbide mononitrate  30 mg Oral Daily   metoprolol succinate  50 mg Oral Daily   multivitamin with minerals  1 tablet Oral Daily   nicotine  21 mg Transdermal Daily   pantoprazole  40 mg Oral Daily   polyethylene glycol  17 g Oral BID   revefenacin  175 mcg Nebulization Daily   rivaroxaban  20 mg Oral Daily   sodium chloride flush  5 mL Intracatheter Q8H   Continuous Infusions:  piperacillin-tazobactam (ZOSYN)  IV 3.375 g (05/14/23 0218)   PRN Meds: albuterol, bisacodyl, haloperidol lactate, morphine injection, oxyCODONE   Vital Signs    Vitals:   05/14/23 0000 05/14/23 0400 05/14/23 0559 05/14/23 0804  BP: 126/73 132/78 132/78 123/73  Pulse: 82   77  Resp: 13 15  14   Temp: 97.6 F (36.4 C)   98 F (36.7 C)  TempSrc: Oral   Oral  SpO2: 100%     Weight:  105 kg    Height:        Intake/Output Summary (Last 24 hours) at 05/14/2023 0832 Last data filed at 05/14/2023 0600 Gross per 24 hour  Intake 845 ml  Output 1445 ml  Net -600 ml      05/14/2023    4:00 AM 05/12/2023    5:00 AM 05/11/2023   10:18 AM  Last 3 Weights  Weight  (lbs) 231 lb 7.7 oz 215 lb 6.2 oz 211 lb 13.8 oz  Weight (kg) 105 kg 97.7 kg 96.1 kg      Telemetry    SR, short runs SVT - Personally Reviewed  ECG    N/a - Personally Reviewed  Physical Exam   Black male no distress Lungs exp wheezing COPD  No murmur  Large dressing in abdomen with JP drain in place Plus one bilateral edema  Labs    High Sensitivity Troponin:   Recent Labs  Lab 04/30/23 1144 04/30/23 1317 05/02/23 1717 05/02/23 1718  TROPONINIHS 48* 36* 37* 40*     Chemistry Recent Labs  Lab 05/11/23 0347 05/12/23 0625 05/13/23 0459 05/14/23 0443  NA 139 141 141 138  K 3.7 4.6 4.7 4.6  CL 96* 95* 93* 94*  CO2 38* 37* 40* 37*  GLUCOSE 145* 117* 106* 143*  BUN 8 10 12 11   CREATININE 0.82 0.94 1.02 0.75  CALCIUM 8.2* 8.8* 9.0 8.5*  MG 1.7  --   --   --  GFRNONAA >60 >60 >60 >60  ANIONGAP 5 9 8 7     Lipids No results for input(s): "CHOL", "TRIG", "HDL", "LABVLDL", "LDLCALC", "CHOLHDL" in the last 168 hours.  Hematology Recent Labs  Lab 05/12/23 0625 05/13/23 0459 05/14/23 0443  WBC 12.8* 13.6* 12.2*  RBC 3.31* 3.11* 2.86*  HGB 8.6* 8.2* 7.4*  HCT 30.6* 28.8* 26.3*  MCV 92.4 92.6 92.0  MCH 26.0 26.4 25.9*  MCHC 28.1* 28.5* 28.1*  RDW 17.6* 17.6* 17.6*  PLT 460* 494* 470*   Thyroid No results for input(s): "TSH", "FREET4" in the last 168 hours.  BNP Recent Labs  Lab 05/12/23 0625  BNP 96.5    DDimer No results for input(s): "DDIMER" in the last 168 hours.   Radiology    CT ABDOMEN PELVIS W CONTRAST  Result Date: 05/13/2023 CLINICAL DATA:  Follow-up perforated appendicitis. EXAM: CT ABDOMEN AND PELVIS WITH CONTRAST TECHNIQUE: Multidetector CT imaging of the abdomen and pelvis was performed using the standard protocol following bolus administration of intravenous contrast. RADIATION DOSE REDUCTION: This exam was performed according to the departmental dose-optimization program which includes automated exposure control, adjustment of the mA  and/or kV according to patient size and/or use of iterative reconstruction technique. CONTRAST:  75mL OMNIPAQUE IOHEXOL 350 MG/ML SOLN COMPARISON:  05/04/2023 FINDINGS: Lower chest: Small right pleural effusion. Hepatobiliary: No focal liver abnormality is seen. No gallstones, gallbladder wall thickening, or biliary dilatation. Pancreas: Unremarkable. No pancreatic ductal dilatation or surrounding inflammatory changes. Spleen: Normal in size without focal abnormality. Adrenals/Urinary Tract: Normal adrenal glands. No nephrolithiasis, hydronephrosis or suspicious mass. Urinary bladder appears normal. Stomach/Bowel: Stomach appears normal. Inflammatory fat stranding identified within the right lower quadrant of the abdomen. The appendix is surgically absent. Several small foci of extraluminal gas identified adjacent to the cecum, image 68/3 and image 66/3. Increased caliber of the small bowel loops is again noted which appears stable to improved in the interval, likely reflecting ileus. Surgical drainage catheter is again seen entering from of the left lower quadrant approach. The tip of the catheter terminates along the right pericolic gutter adjacent to the inferior margin of the right lobe of liver. The previously noted crescent shaped fluid collection within the posterior pelvis, anterior to the rectum, has decreased in volume from previous exam. On today's study this measures 4.9 x 2.0 by 1.4 cm (volume = 7.2 cm^3), image 77/3 and image 55/6. The percutaneous, pigtail drainage catheter placed on 05/05/2023 is noted along the right inferior margin of this fluid collection, image 77/3. Formally this measured 6.8 x 3.8 by 3.9 cm (volume = 53 cm^3). As before there is a tubular shaped fluid collection extending along the right pelvic sidewall measuring 2.4 x 1.7 by 5.8 cm (volume = 12 cm^3), image 82/7 and image 69/3. On the previous exam this measured 2.8 by 1.6 by 7.2 cm (volume = 17 cm^3). No new fluid collections  identified. Vascular/Lymphatic: Aortic atherosclerosis without aneurysm. No abdominopelvic adenopathy. Reproductive: Prostate is unremarkable. Other: Postoperative changes identified along the midline of the ventral abdominal wall. Small punctate foci of gas within the subcutaneous soft tissues noted compatible with postop change. No fluid collection identified within the abdominal wall. Musculoskeletal: Lumbar degenerative disc disease. No acute or suspicious osseous findings. IMPRESSION: 1. Interval decrease in volume of crescent shaped fluid collection within the posterior pelvis, anterior to the rectum. The percutaneous, pigtail drainage catheter placed on 05/05/2023 is noted along the right inferior margin of this fluid collection. 2. Slight decrease size of tubular  shaped fluid collection extending along the right pelvic sidewall measuring 2.4 x 1.7 by 5.8 cm. 3. Persistent increased caliber of the small bowel loops is again noted which appears stable to improved in the interval, likely reflecting ileus. 4. Small right pleural effusion. 5.  Aortic Atherosclerosis (ICD10-I70.0). Electronically Signed   By: Signa Kell M.D.   On: 05/13/2023 14:02    Cardiac Studies    Patient Profile      Assessment & Plan    1.PAF/aflutter with RVR - he is on toprol 50mg  daily for rate control - Back on xarelto 20mg  daily.    2. HFimpEF/RV dysfunction - - Echo 11/21 showed LVEF improved to 55-60% , no RWMA, grade II DD, RV mod reduced, RV mod enlarged, mild LAE. TR inadequate to measure PASP - history of severe COPD and restrictive lung disease, likely group III pulm HTN.  - VQ negative this admission for PE. Consider outpatient sleep study   BNP normalized good diuresis Nees nebs for COPD wrote for oral lasix 40 mg daily    3. Chest pain -Cath in 2012 -Ischemia on Myoview 2016 - some chest pain this admission in setting of wheezing, volume overload.    4. Syncope - Patient reports 2 years of  intermittent generalized body shaking with syncope and loss of consciousness that are often positional when going from sitting to standing and therefore sound orthostatic  - 21 beat run of NSVT on 11/23  - consider outpatient monitor if ongoing episodes at f/u     5. Perforated appendicitis/sepsis - per primary team - Went to IR for transgluteal pelvic drain placement, drain management by IR anemic and WBC still elevated On Zosyn for antibiotics  - CT last night with drain in place improved collection but retracted may need to have drain upsized   6. PAD - has been on ASA, statin  For questions or updates, please contact [facility] Please consult [email or web address] for contact info under        Signed, Charlton Haws, MD  05/14/2023, 8:32 AM

## 2023-05-14 NOTE — Progress Notes (Signed)
Mobility Specialist Progress Note:   05/14/23 0910  Mobility  Activity Transferred from bed to chair  Level of Assistance Contact guard assist, steadying assist  Assistive Device None  Distance Ambulated (ft) 5 ft  Activity Response Tolerated well  Mobility Referral Yes  $Mobility charge 1 Mobility  Mobility Specialist Start Time (ACUTE ONLY) 0910  Mobility Specialist Stop Time (ACUTE ONLY) 0920  Mobility Specialist Time Calculation (min) (ACUTE ONLY) 10 min   Pt agreeable to mobility session, however only minimal mobility d/t wanting to eat first. Required only minG to transfer to chair. Left with all needs met.  Addison Lank Mobility Specialist Please contact via SecureChat or  Rehab office at [phone]

## 2023-05-14 NOTE — [place] FL2 (Signed)
[place] MEDICAID FL2 LEVEL OF CARE FORM     IDENTIFICATION  Patient Name: Jared [name] Birthdate: 04/02/1956 Sex: male Admission Date (Current Location): 04/30/2023  [facility] and [place] Number:  Producer, television/film/video and Address:  [facility], [address], [place], [place] [address]      Provider Number: [id number]  Attending Physician Name and Address:  Elgergawy, Leana Roe, MD  Relative Name and Phone Number:  Tinnie Gens (Sister)  [phone]    Current Level of Care: Hospital Recommended Level of Care: Skilled Nursing Facility Prior Approval Number:    Date Approved/Denied:   PASRR Number: [id number]  Discharge Plan: SNF    Current Diagnoses: Patient Active Problem List   Diagnosis Date Noted   Heart failure with improved ejection fraction (HFimpEF) (HCC) 05/12/2023   Atrial fibrillation with RVR (HCC) 05/06/2023   HFrEF (heart failure with reduced ejection fraction) (HCC) 05/06/2023   Abnormal echocardiogram 05/02/2023   Malnutrition of moderate degree 05/01/2023   Sepsis (HCC) 04/30/2023   Acute perforated appendicitis 04/30/2023   History of CVA (cerebrovascular accident) 04/30/2023   PAF (paroxysmal atrial fibrillation) (HCC) 12/27/2022   Atherosclerosis of aorta (HCC) 10/25/2022   Peripheral vascular disease (HCC) 10/25/2022   Weakness 03/21/2022   Syncope and collapse 02/07/2021   History of GI bleed    Gastric ulcer due to Helicobacter pylori and nonsteroidal anti-inflammatory drug (NSAID)    AKI (acute kidney injury) (HCC) 11/18/2020   Epigastric pain    Syncope 06/11/2019   Chronic right maxillary sinusitis 01/29/2019   Chronic respiratory failure with hypoxia and hypercapnia (HCC) 01/01/2019   Upper GI bleed 10/27/2018   Anticoagulated 09/27/2018   AVM (arteriovenous malformation) of small bowel, acquired    Iron deficiency anemia due to chronic blood loss    Symptomatic anemia 09/04/2018   Tobacco use  09/04/2018   Noncompliance 06/14/2017   Elevated LFTs 08/13/2016   Chronic respiratory failure with hypoxia (HCC) 07/24/2016   Atrial flutter (HCC)    Hepatic congestion 07/02/2016   COPD with acute exacerbation (HCC) 06/03/2016   Type 2 diabetes mellitus (HCC) 05/14/2016   COPD GOLD III/still smoking  02/29/2016   Cigarette smoker 01/09/2016   CAD (coronary artery disease) 10/07/2013   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 Closed System Drain 1 Right;Posterior;Lateral Buttock Bulb (JP) 8 Fr.)                       Personal Care Assistance Level of Assistance  Bathing, Feeding, Dressing Bathing Assistance: Limited assistance Feeding assistance: Independent Dressing Assistance: Limited assistance     Functional Limitations Info  Sight, Hearing, Speech Sight Info: Impaired (wears reading glasses) Hearing Info: Adequate      SPECIAL CARE FACTORS FREQUENCY  PT (By licensed PT), OT (By licensed OT)     PT Frequency: 5x/week OT Frequency: 5x/week            Contractures  Contractures Info: Not present    Additional Factors Info  Code Status, Allergies Code Status Info: FULL Allergies Info: Lisinopril           Current Medications (05/14/2023):  This is the current hospital active medication list Current Facility-Administered Medications  Medication Dose Route Frequency  Provider Last Rate Last Admin   acetaminophen (TYLENOL) tablet 1,000 mg  1,000 mg Oral Q6H Jonah Blue, MD   1,000 mg at 05/14/23 0600   albuterol (PROVENTIL) (2.5 MG/3ML) 0.083% nebulizer solution 2.5 mg  2.5 mg Nebulization Q2H PRN Jonah Blue, MD   2.5 mg at 05/06/23 1212   Ampicillin-Sulbactam (UNASYN) 3 g in sodium chloride 0.9 % 100 mL IVPB  3 g Intravenous Q6H Pham, Minh Q, RPH-CPP       arformoterol (BROVANA) nebulizer solution 15 mcg  15 mcg Nebulization BID Simonne Martinet, NP   15 mcg at 05/14/23 6440   aspirin EC tablet 81 mg  81 mg Oral Daily Eric Form, PA-C   81 mg at 05/14/23 0936   atorvastatin (LIPITOR) tablet 80 mg  80 mg Oral Daily Eric Form, PA-C   80 mg at 05/14/23 3474   bisacodyl (DULCOLAX) suppository 10 mg  10 mg Rectal Daily PRN Axel Filler, MD       bisacodyl (DULCOLAX) suppository 10 mg  10 mg Rectal Once Meuth, Brooke A, PA-C       budesonide (PULMICORT) nebulizer solution 0.5 mg  0.5 mg Nebulization BID Simonne Martinet, NP   0.5 mg at 05/14/23 2595   Chlorhexidine Gluconate Cloth 2 % PADS 6 each  6 each Topical Daily Hunsucker, Lesia Sago, MD   6 each at 05/13/23 0916   docusate sodium (COLACE) capsule 100 mg  100 mg Oral BID Meuth, Brooke A, PA-C   100 mg at 05/14/23 0936   DULoxetine (CYMBALTA) DR capsule 60 mg  60 mg Oral Daily Eric Form, PA-C   60 mg at 05/14/23 6387   ezetimibe (ZETIA) tablet 10 mg  10 mg Oral Daily Eric Form, PA-C   10 mg at 05/14/23 0935   feeding supplement (BOOST / RESOURCE BREEZE) liquid 1 Container  1 Container Oral TID WC Jonah Blue, MD   1 Container at 05/13/23 1546   feeding supplement (ENSURE ENLIVE / ENSURE PLUS) liquid 237 mL  237 mL Oral BID BM Jonah Blue, MD   237 mL at 05/12/23 1259   ferrous sulfate tablet 325 mg  325 mg Oral Q breakfast Eric Form, PA-C   325 mg at 05/14/23 5643   folic acid (FOLVITE) tablet 1 mg  1 mg Oral Daily Eric Form, PA-C   1 mg at  05/14/23 3295   furosemide (LASIX) tablet 40 mg  40 mg Oral Daily Wendall Stade, MD   40 mg at 05/14/23 0936   gabapentin (NEURONTIN) capsule 300 mg  300 mg Oral BID Eric Form, PA-C   300 mg at 05/14/23 1884   haloperidol lactate (HALDOL) injection 2-5 mg  2-5 mg Intravenous Q6H PRN Jonah Blue, MD       hydrALAZINE (APRESOLINE) tablet 50 mg  50 mg Oral Q8H Eric Form, PA-C   50 mg at 05/14/23 0559   isosorbide mononitrate (IMDUR) 24 hr tablet 30 mg  30 mg Oral Daily Eric Form, PA-C   30 mg at 05/14/23 1660   metoprolol succinate (TOPROL-XL) 24 hr tablet 50 mg  50 mg Oral Daily Kabrich,  Francena Hanly, PA-C   50 mg at 05/14/23 1610   morphine (PF) 2 MG/ML injection 1-2 mg  1-2 mg Intravenous Q3H PRN Eric Form, PA-C   2 mg at 05/07/23 9604   multivitamin with minerals tablet 1 tablet  1 tablet Oral Daily Eric Form, PA-C   1 tablet at 05/14/23 5409   nicotine (NICODERM CQ - dosed in mg/24 hours) patch 21 mg  21 mg Transdermal Daily Eric Form, PA-C   21 mg at 05/14/23 8119   oxyCODONE (Oxy IR/ROXICODONE) immediate release tablet 5-10 mg  5-10 mg Oral Q4H PRN Eric Form, PA-C   10 mg at 05/13/23 2153   pantoprazole (PROTONIX) EC tablet 40 mg  40 mg Oral Daily Eric Form, PA-C   40 mg at 05/14/23 1478   polyethylene glycol (MIRALAX / GLYCOLAX) packet 17 g  17 g Oral BID Axel Filler, MD   17 g at 05/13/23 2956   revefenacin (YUPELRI) nebulizer solution 175 mcg  175 mcg Nebulization Daily Simonne Martinet, NP   175 mcg at 05/14/23 2130   rivaroxaban (XARELTO) tablet 20 mg  20 mg Oral Daily Elgergawy, Leana Roe, MD   20 mg at 05/14/23 0936   sodium chloride flush (NS) 0.9 % injection 5 mL  5 mL Intracatheter Q8H Suttle, Thressa Sheller, MD   5 mL at 05/14/23 0600     Discharge Medications: Please see discharge summary for a list of discharge medications.  Relevant Imaging Results:  Relevant Lab Results:   Additional Information SSN:  [id number]. JP drain  Mearl Latin, LCSW

## 2023-05-14 NOTE — Progress Notes (Signed)
Progress Note   Patient: Jared [name] UXL:[id number] DOB: Dec 02, 1955 DOA: 04/30/2023     14 DOS: the patient was seen and examined on 05/14/2023   Brief hospital course: 67yo with h/o chronic combined CHF, HTN, CVA, afib on Xarelto, PVD, and COPD on 2L home O2 who presented on 11/19 with SOB and other complaints.  Fever 100.9, WBC 13.4, lactate 2.9, Creatinine 1.65 with AKI.  CT A?P with acute perforated appendicitis with microperforation.  Admitted for IV antibiotics, Xarelto washout.  Surgery is consulting.  Underwent lap appy on 11/20 but slow recovery.  Repeat CT with abscess and concern for peritonitis.  Underwent transgluteal pelvic drain placement on 11/24 with unresponsiveness/respiratory distress after procedure which resolved.  Assessment and Plan:  Sepsis due to acute perforated appendicitis with microperforation - Post laparoscopic appendectomy on 11/20 - visible appendiceal perforation noted with abscess that was drained and fecaliths removed; surgical drain was left in place - Remains on IV Zosyn,  - Blood cultures negative x 5 days from admission -repeat CT 11/24 performed with deep abscess appreciated,, concern for peritonitis on 11/24, status post IR transgluteal pelvic drain placement.culture E coli and Klebsiella pneumoniae. On zosyn.  -Pete CT abdomen pelvis yesterday showing with improving abscess size, but drain presents at the end of abscess, so went to IR again today, status post drain injection, manipulation and exchange to a bigger size.  Acute on chronic hypoxic respiratory failure Patient with respiratory event postprocedure requiring prone positioning where he did require NRB and required ICU monitoring overnight. -2 L nasal cannula at baseline, continue 3 to 4 L oxygen Chronic respiratory failure is secondary to COPD on 2L, currently on 4L with ongoing wheezing despite nebs PRN duoneb, Albuterol Continue Breztri   Delirium Improving   Syncope and  collapse -Echo shows preserved EF, grade 2 diastolic dysfunction with moderate reduction in RV function and RV dilatation and a vague density in the IVC, concerning for possible PE/IVC thrombus, Abdominal US ordered to rule out IVC thrombus but was non-diagnostic due to body habitus and air in abdominal cavity, VQ scan negative for PE , venous Dopplers negative for DVT . -On full anticoagulation in the setting of known A-fib   IDA Patient with h/o iron deficiency anemia, possibly with superimposed ABLA from surgery Receives weekly IV iron infusions, given on 11/21  Hgb improved post-iron, appears to be stable   Essential hypertension Continue hydralazine, Imdur, metoprolol   HLD Continue Zetia, atorvastatin  HFrEF with improved LVEF RV dysfunction new this admission  - Echo 11/21 showed LVEF improved to 55-60% , no RWMA, grade II DD, RV mod reduced, RV mod enlarged, mild LAE, There is a vague density in the IVC. Cannot rule out thrombus.  -Cardiology input greatly appreciated, diuresis per cardiology he received 40 mg of IV Lasix today -Will need sleep study as an outpatient. -Continue with Toprol-XL, Imdur and hydralazine   Atrial flutter  Continue Toprol XL On heparin GTT initially, now back on Xarelto as no more procedures anticipated.     History of CVA (cerebrovascular accident) Continue aspirin Xarelto currently on hold    Peripheral vascular disease  Continue aspirin, statin   Depression Continue Cymbalta, gabapentin   Tobacco use Ongoing use of close to 1 pack/day Nicotine patch offered   Malnutrition Nutrition Problem: Moderate Malnutrition Etiology: acute illness Signs/Symptoms: energy intake < 75% for > 7 days, mild muscle depletion, mild fat depletion Interventions: MVI  Hypokalemia Hypophosphatemia -replacing, monitor closely  Consultants: Surgery Cardiology Neurology PCCM IR Nutrition PT OT   Procedures: Appendectomy  11/20 Transgluteal pelvic drain placement 11/24 Transgluteal drain injection, manipulation and exchange by IR 12/3   Antibiotics: Zosyn 11/19-    30 Day Unplanned Readmission Risk Score    Flowsheet Row ED to Hosp-Admission (Current) from 04/30/2023 in [place] [facility]  30 Day Unplanned Readmission Risk Score (%) 21.96 Filed at 05/07/2023 0401       This score is the patient's risk of an unplanned readmission within 30 days of being discharged (0 -100%). The score is based on dignosis, age, lab data, medications, orders, and past utilization.   Low:  0-14.9   Medium: 15-21.9   High: 22-29.9   Extreme: 30 and above           Subjective:   No significant events overnight, reports good BM yesterday after taking laxatives, was able to sit on the chair for few hours yesterday   Objective: Vitals:   05/14/23 1349 05/14/23 1350  BP: 126/74 126/74  Pulse: 92 92  Resp: 20 20  Temp:    SpO2: 94% 94%    Intake/Output Summary (Last 24 hours) at 05/14/2023 1405 Last data filed at 05/14/2023 1300 Gross per 24 hour  Intake 365 ml  Output 1445 ml  Net -1080 ml   Filed Weights   05/11/23 1018 05/12/23 0500 05/14/23 0400  Weight: 96.1 kg 97.7 kg 105 kg    Exam:   Awake Alert, Oriented X 3, No new F.N deficits, Normal affect Symmetrical Chest wall movement, improved air entry, minimal wheezing today RRR,No Gallops,Rubs or new Murmurs, No Parasternal Heave +ve B.Sounds,abd wound bandaged, has JP drain, and IR transgluteal drain present both with serosanguineous output. No Cyanosis, Clubbing or edema, No new Rash or bruise       Family Communication: None present today  Disposition: Status is: Inpatient Remains inpatient appropriate because: ongoing management      Unresulted Labs (From admission, onward)     Start     Ordered   05/14/23 0500  CBC  Daily,   R     Question:  Specimen collection method  Answer:  Lab=Lab collect   05/13/23 1244    05/14/23 0500  Basic metabolic panel  Daily,   R     Question:  Specimen collection method  Answer:  Lab=Lab collect   05/13/23 1244   05/14/23 0500  Phosphorus  Daily,   R     Question:  Specimen collection method  Answer:  Lab=Lab collect   05/13/23 1244             Author: Huey Bienenstock, MD 05/14/2023 2:05 PM  For on call review [email or web address].

## 2023-05-14 NOTE — Plan of Care (Signed)

## 2023-05-14 NOTE — Procedures (Signed)
Interventional Radiology Procedure:   Indications: Residual pelvic abscess with drain in place.  Drain is located along the edge of the collection.   Procedure: Drain injection, drain manipulation and drain exchange  Findings: Existing drain was communicating with residual collection.  Catheter was manipulated into the abscess and 10 Fr drain was placed.   Complications: None     EBL: Minimal  Plan: Drain attached to suction bulb, follow output.   Jared Mcafee R. Lowella Dandy, MD  Pager: [phone]

## 2023-05-14 NOTE — Consult Note (Addendum)
Chief Complaint: Pelvic abscess. Patient presents for abscess drain exchange, reposition and possible upsize  Referring Physician(s): Carlena Bjornstad PA  Supervising Physician: Richarda Overlie  Patient Status: [facility] - In-pt  History of Present Illness: Jared [name] is a 67 y.o. male inpatient. inpatient. post appendectomy complicated postoperatively by pelvic fluid collection concerning for abscess. Presented to ED on 04/30/23 with RLQ and testicle pain, workup showed sepsis and perforated appendicitis. Patient underwent laparoscopic appendectomy on 05/01/23,,CT A/P was obtained on 05/04/23 due to worsening leukocytosis which showed post op abscess in the deep pelvis. On 11.24.24 IR placed a 8 Fr access drain into pelvic abscess with aspiration of 75 ml. Cultures grew abundant e.coli, klebsella pneumonia, bactericides argils, bactericides cascade and lactamase cacao. Output has been: 5 ml. 10 ml. 20 ml.  CT abd pelvis from 12.2.24 reads:  interval decrease in volume of crescent shaped fluid collection within the posterior pelvis, anterior to the rectum. The percutaneous, pigtail drainage catheter placed on 05/05/2023 is noted along the right inferior margin of this fluid collection.  Patient alert and laying in bed,calm. Endorses "back pain" at the drain site. Denies any fevers, headache, chest pain, SOB, cough, abdominal pain, nausea, vomiting or bleeding.   Return precautions and treatment recommendations and follow-up discussed with the patient  who is agreeable with the plan.  WBC 12.2, Hgb 7.4. Patient is on asa 81 mf, Last dose given on 12.3.24 @ 09:36> patient has been NPO since midnight.    Past Medical History:  Diagnosis Date   AVM (arteriovenous malformation)    CAD (coronary artery disease)    a. LHC 5/12:  LAD 20, pLCx 20, pRCA 40, dRCA 40, EF 35%, diff HK  //  b. Myoview 4/16: Overall Impression:  High risk stress nuclear study There is no evidence of ischemia.  There is severe  LV dysfunction. LV Ejection Fraction: 30%.  LV Wall Motion:  There is global LV hypokinesis.     CAP (community acquired pneumonia) 09/2013   Chronic combined systolic and diastolic CHF (congestive heart failure) (HCC)    a. Echo 4/16:Mild LVH, EF 40-45%, diffuse HK //  b. Echo 8/17: EF 35-40%, diffuse HK, diastolic dysfunction, aortic sclerosis, trivial MR, moderate LAE, normal RVSF, moderate RAE, mild TR, PASP 42 mmHg // c. Echo 4/18: Mild concentric LVH, EF 30-35, normal wall motion, grade 1 diastolic dysfunction, PASP 49   Chronic respiratory failure (HCC)    Cluster headache    "hx; haven't had one in awhile" (01/09/2016)   COPD (chronic obstructive pulmonary disease) (HCC)    [name] 01/09/2016   DM (diabetes mellitus) (HCC)    History of CVA (cerebrovascular accident)    Hypertension    IDA (iron deficiency anemia)    Moderate tobacco use disorder    NICM (nonischemic cardiomyopathy) (HCC)    Nicotine addiction    Tobacco abuse    Type 2 diabetes mellitus (HCC) 05/14/2016    Past Surgical History:  Procedure Laterality Date   ABDOMINAL AORTOGRAM W/LOWER EXTREMITY N/A 12/07/2022   Procedure: ABDOMINAL AORTOGRAM W/LOWER EXTREMITY;  Surgeon: Leonie Douglas, MD;  Location: [facility];  Service: Cardiovascular;  Laterality: N/A;   BIOPSY  11/12/2020   Procedure: BIOPSY;  Surgeon: Lemar Lofty., MD;  Location: [facility] ENDOSCOPY;  Service: Gastroenterology;;   CARDIAC CATHETERIZATION  10/2010   LM normal, LAD with 20% irregularities, LCX with 20%, RCA with 40% prox and 40% distal - EF of 35%   CATARACT EXTRACTION, BILATERAL  COLONOSCOPY W/ BIOPSIES AND POLYPECTOMY     COLONOSCOPY WITH PROPOFOL N/A 09/06/2018   Procedure: COLONOSCOPY WITH PROPOFOL;  Surgeon: Tressia Danas, MD;  Location: [facility] ENDOSCOPY;  Service: Gastroenterology;  Laterality: N/A;   ENTEROSCOPY N/A 09/28/2018   Procedure: ENTEROSCOPY;  Surgeon: Jeani Hawking, MD;  Location: [facility] ENDOSCOPY;  Service:  Endoscopy;  Laterality: N/A;   ENTEROSCOPY N/A 10/28/2018   Procedure: ENTEROSCOPY;  Surgeon: Tressia Danas, MD;  Location: [facility] ENDOSCOPY;  Service: Gastroenterology;  Laterality: N/A;   ENTEROSCOPY N/A 10/09/2020   Procedure: ENTEROSCOPY;  Surgeon: Hilarie Fredrickson, MD;  Location: [facility] ENDOSCOPY;  Service: Endoscopy;  Laterality: N/A;   ENTEROSCOPY N/A 03/22/2022   Procedure: ENTEROSCOPY;  Surgeon: Benancio Deeds, MD;  Location: [facility] ENDOSCOPY;  Service: Gastroenterology;  Laterality: N/A;   ESOPHAGOGASTRODUODENOSCOPY N/A 11/12/2020   Procedure: ESOPHAGOGASTRODUODENOSCOPY (EGD);  Surgeon: Lemar Lofty., MD;  Location: [facility] ENDOSCOPY;  Service: Gastroenterology;  Laterality: N/A;   ESOPHAGOGASTRODUODENOSCOPY (EGD) WITH PROPOFOL N/A 09/05/2018   Procedure: ESOPHAGOGASTRODUODENOSCOPY (EGD) WITH PROPOFOL;  Surgeon: Benancio Deeds, MD;  Location: [facility] ENDOSCOPY;  Service: Gastroenterology;  Laterality: N/A;   ESOPHAGOGASTRODUODENOSCOPY (EGD) WITH PROPOFOL N/A 11/19/2020   Procedure: ESOPHAGOGASTRODUODENOSCOPY (EGD) WITH PROPOFOL;  Surgeon: Beverley Fiedler, MD;  Location: [facility] ENDOSCOPY;  Service: Gastroenterology;  Laterality: N/A;   ESOPHAGOGASTRODUODENOSCOPY (EGD) WITH PROPOFOL N/A 12/27/2022   Procedure: ESOPHAGOGASTRODUODENOSCOPY (EGD) WITH PROPOFOL;  Surgeon: Napoleon Form, MD;  Location: [facility] ENDOSCOPY;  Service: Gastroenterology;  Laterality: N/A;   EXCISION MASS HEAD     GIVENS CAPSULE STUDY N/A 09/06/2018   Procedure: GIVENS CAPSULE STUDY;  Surgeon: Tressia Danas, MD;  Location:  [facility] ENDOSCOPY;  Service: Gastroenterology;  Laterality: N/A;   GIVENS CAPSULE STUDY N/A 09/26/2018   Procedure: GIVENS CAPSULE STUDY;  Surgeon: Beverley Fiedler, MD;  Location: [facility] ENDOSCOPY;  Service: Gastroenterology;  Laterality: N/A;   GIVENS CAPSULE STUDY N/A 06/14/2019   Procedure: GIVENS CAPSULE STUDY;  Surgeon: Napoleon Form, MD;  Location: [facility] ENDOSCOPY;  Service: Endoscopy;  Laterality: N/A;    HEMOSTASIS CLIP PLACEMENT  11/12/2020   Procedure: HEMOSTASIS CLIP PLACEMENT;  Surgeon: Lemar Lofty., MD;  Location: [facility] ENDOSCOPY;  Service: Gastroenterology;;   HEMOSTASIS CONTROL  11/12/2020   Procedure: HEMOSTASIS CONTROL;  Surgeon: Lemar Lofty., MD;  Location: [facility] ENDOSCOPY;  Service: Gastroenterology;;   HOT HEMOSTASIS N/A 10/28/2018   Procedure: HOT HEMOSTASIS (ARGON PLASMA COAGULATION/BICAP);  Surgeon: Tressia Danas, MD;  Location: [facility] ENDOSCOPY;  Service: Gastroenterology;  Laterality: N/A;   HOT HEMOSTASIS N/A 11/12/2020   Procedure: HOT HEMOSTASIS (ARGON PLASMA COAGULATION/BICAP);  Surgeon: Lemar Lofty., MD;  Location: [facility] ENDOSCOPY;  Service: Gastroenterology;  Laterality: N/A;   HOT HEMOSTASIS N/A 03/22/2022   Procedure: HOT HEMOSTASIS (ARGON PLASMA COAGULATION/BICAP);  Surgeon: Benancio Deeds, MD;  Location: [facility] ENDOSCOPY;  Service: Gastroenterology;  Laterality: N/A;   INCISION AND DRAINAGE PERIRECTAL ABSCESS N/A 06/05/2017   Procedure: IRRIGATION AND DEBRIDEMENT PERIRECTAL ABSCESS;  Surgeon: Andria Meuse, MD;  Location: [facility] OR;  Service: General;  Laterality: N/A;   LAPAROSCOPIC APPENDECTOMY N/A 05/01/2023   Procedure: APPENDECTOMY LAPAROSCOPIC;  Surgeon: Emelia Loron, MD;  Location: [facility] OR;  Service: General;  Laterality: N/A;   PERIPHERAL VASCULAR INTERVENTION  12/07/2022   Procedure: PERIPHERAL VASCULAR INTERVENTION;  Surgeon: Leonie Douglas, MD;  Location: [facility];  Service: Cardiovascular;;   SUBMUCOSAL TATTOO INJECTION  11/12/2020   Procedure: SUBMUCOSAL TATTOO INJECTION;  Surgeon: Lemar Lofty., MD;  Location: [facility] ENDOSCOPY;  Service: Gastroenterology;;  VIDEO BRONCHOSCOPY Bilateral 05/08/2016   Procedure: VIDEO BRONCHOSCOPY WITH FLUORO;  Surgeon: Oretha Milch, MD;  Location: [facility] ENDOSCOPY;  Service: Cardiopulmonary;  Laterality: Bilateral;    Allergies: Lisinopril  Medications: Prior to Admission  medications   Medication Sig Start Date End Date Taking? Authorizing Provider  acetaminophen (TYLENOL) 500 MG tablet Take 2 tablets (1,000 mg total) by mouth every 8 (eight) hours as needed for moderate pain. 01/03/23  Yes Newlin, Enobong, MD  albuterol (VENTOLIN HFA) 108 (90 Base) MCG/ACT inhaler INHALE 2 PUFFS INTO THE LUNGS EVERY 6 (SIX) HOURS AS NEEDED FOR WHEEZING OR SHORTNESS OF BREATH. Patient taking differently: Inhale 2 puffs into the lungs daily as needed for wheezing or shortness of breath. 12/06/22  Yes [name], MD  aspirin EC 81 MG tablet Take 1 tablet (81 mg total) by mouth daily. Swallow whole. 12/07/22  Yes Leonie Douglas, MD  atorvastatin (LIPITOR) 80 MG tablet Take 1 tablet (80 mg total) by mouth daily. 02/04/23  Yes [name], MD  DULoxetine (CYMBALTA) 60 MG capsule Take 1 capsule (60 mg total) by mouth daily. For chronic leg pains 01/09/23  Yes [name], MD  ergocalciferol (DRISDOL) 1.25 MG (50000 UT) capsule Take 1 capsule (50,000 Units total) by mouth once a week. 02/26/23  Yes [name], MD  ezetimibe (ZETIA) 10 MG tablet Take 1 tablet (10 mg total) by mouth daily. 01/31/23  Yes Pricilla Riffle, MD  ferrous sulfate (FEROSUL) 325 (65 FE) MG tablet Take 1 tablet (325 mg total) by mouth daily with breakfast. 01/30/23  Yes Danis, Andreas Blower, MD  folic acid (FOLVITE) 1 MG tablet Take 1 tablet (1 mg total) by mouth daily. 01/30/23  Yes [name], MD  furosemide (LASIX) 40 MG tablet Take 1-2 tablets (40-80 mg total) by mouth 2 (two) times daily as needed.Take an extra pill with worsening pedal edema. 01/09/23  Yes [name], MD  gabapentin (NEURONTIN) 300 MG capsule Take 1 capsule (300 mg total) by mouth 2 (two) times daily. 02/25/23  Yes Kathryne Hitch, MD  hydrALAZINE (APRESOLINE) 50 MG tablet Take 1 tablet (50 mg total) by mouth every 8 (eight) hours. 02/04/23  Yes [name], MD  isosorbide mononitrate (IMDUR) 30 MG 24 hr tablet Take 1  tablet (30 mg total) by mouth daily. 01/30/23  Yes [name], MD  metoprolol succinate (TOPROL-XL) 50 MG 24 hr tablet Take 1 tablet (50 mg total) by mouth daily. Take with or immediately following a meal. 02/04/23  Yes Newlin, Enobong, MD  nitroGLYCERIN (NITROSTAT) 0.4 MG SL tablet Place 1 tablet (0.4 mg total) under the tongue every 5 (five) minutes as needed for chest pain. 11/19/20 04/30/23 Yes Albertine Grates, MD  OXYGEN Inhale 2 L/min into the lungs continuous.   Yes [provider]  pantoprazole (PROTONIX) 40 MG tablet Take 1 tablet (40 mg total) by mouth daily. 02/04/23  Yes [name], MD  rivaroxaban (XARELTO) 20 MG TABS tablet Take 1 tablet (20 mg total) by mouth daily with supper. 10/25/22  Yes [name], MD  Accu-Chek Softclix Lancets lancets Use to check blood sugar once daily. 01/09/23   [name], MD  Blood Glucose Monitoring Suppl (ACCU-CHEK GUIDE) w/Device KIT Use to check blood sugar once daily. 01/30/23   [name], MD  Budeson-Glycopyrrol-Formoterol (BREZTRI AEROSPHERE) 160-9-4.8 MCG/ACT AERO Take 2 puffs first thing in morning and then another 2 puffs about 12 hours later. 02/25/23   Nyoka Cowden, MD  glucose blood (ACCU-CHEK  GUIDE) test strip Use to check blood sugar once daily. 01/09/23   [name], MD  TUDORZA PRESSAIR 400 MCG/ACT AEPB INHALE 1 PUFF INTO THE LUNGS IN THE MORNING AND AT BEDTIME. 03/29/20 04/04/20  Nyoka Cowden, MD     Family History  Problem Relation Age of Onset   Heart disease Mother    Diabetes Mother    Colon cancer Mother    Liver cancer Mother    Cancer Father        type unknown   Diabetes Sister        x 2   Diabetes Brother     Social History   Socioeconomic History   Marital status: Single    Spouse name: Not on file   Number of children: 1   Years of education: Not on file   Highest education level: 12th grade  Occupational History   Occupation: retired  Tobacco Use   Smoking status: Every Day     Current packs/day: 0.50    Average packs/day: 0.5 packs/day for 47.0 years (23.5 ttl pk-yrs)    Types: Cigarettes   Smokeless tobacco: Never   Tobacco comments:    4/5 cigs  Vaping Use   Vaping status: Never Used  Substance and Sexual Activity   Alcohol use: Not Currently    Alcohol/week: 0.0 standard drinks of alcohol    Comment: last drink was before xmas   Drug use: No    Types: Cocaine, Marijuana    Comment: "nothing in 20 years"   Sexual activity: Not Currently  Other Topics Concern   Not on file  Social History Narrative   unemployed   Social Determinants of Health   Financial Resource Strain: Medium Risk (02/06/2023)   Overall Financial Resource Strain (CARDIA)    Difficulty of Paying Living Expenses: Somewhat hard  Food Insecurity: No Food Insecurity (04/30/2023)   Hunger Vital Sign    Worried About Running Out of Food in the Last Year: Never true    Ran Out of Food in the Last Year: Never true  Transportation Needs: No Transportation Needs (04/30/2023)   PRAPARE - Administrator, Civil Service (Medical): No    Lack of Transportation (Non-Medical): No  Physical Activity: Insufficiently Active (02/06/2023)   Exercise Vital Sign    Days of Exercise per Week: 3 days    Minutes of Exercise per Session: 10 min  Stress: No Stress Concern Present (02/06/2023)   [employer] of Occupational Health - Occupational Stress Questionnaire    Feeling of Stress : Not at all  Social Connections: Moderately Integrated (02/06/2023)   Social Connection and Isolation Panel [NHANES]    Frequency of Communication with Friends and Family: More than three times a week    Frequency of Social Gatherings with Friends and Family: Twice a week    Attends Religious Services: More than 4 times per year    Active Member of [employer] or Organizations: Yes    Attends Engineer, structural: More than 4 times per year    Marital Status: Divorced     Review of Systems: A 12  point ROS discussed and pertinent positives are indicated in the HPI above.  All other systems are negative.  Review of Systems  Constitutional:  Negative for fever.  HENT:  Negative for congestion.   Respiratory:  Negative for cough and shortness of breath.   Cardiovascular:  Negative for chest pain.  Gastrointestinal:  Positive for rectal pain (drain site).  Negative for abdominal pain.  Neurological:  Negative for headaches.  Psychiatric/Behavioral:  Negative for behavioral problems and confusion.     Vital Signs: BP 123/73 (BP Location: Left Arm)   Pulse 80   Temp 98 F (36.7 C) (Oral)   Resp 14   Ht 6\' 3"  (1.905 m)   Wt 231 lb 7.7 oz (105 kg)   SpO2 99%   BMI 28.93 kg/m    Physical Exam Vitals and nursing note reviewed.  Constitutional:      Appearance: He is well-developed.  HENT:     Head: Normocephalic.  Cardiovascular:     Rate and Rhythm: Normal rate.  Pulmonary:     Effort: Pulmonary effort is normal.  Musculoskeletal:        General: Normal range of motion.     Cervical back: Normal range of motion.  Skin:    General: Skin is warm and dry.  Neurological:     General: No focal deficit present.     Mental Status: He is alert and oriented to person, place, and time.  Psychiatric:        Mood and Affect: Mood normal.        Behavior: Behavior normal.     Imaging: CT ABDOMEN PELVIS W CONTRAST  Result Date: 05/13/2023 CLINICAL DATA:  Follow-up perforated appendicitis. EXAM: CT ABDOMEN AND PELVIS WITH CONTRAST TECHNIQUE: Multidetector CT imaging of the abdomen and pelvis was performed using the standard protocol following bolus administration of intravenous contrast. RADIATION DOSE REDUCTION: This exam was performed according to the departmental dose-optimization program which includes automated exposure control, adjustment of the mA and/or kV according to patient size and/or use of iterative reconstruction technique. CONTRAST:  75mL OMNIPAQUE IOHEXOL 350 MG/ML  SOLN COMPARISON:  05/04/2023 FINDINGS: Lower chest: Small right pleural effusion. Hepatobiliary: No focal liver abnormality is seen. No gallstones, gallbladder wall thickening, or biliary dilatation. Pancreas: Unremarkable. No pancreatic ductal dilatation or surrounding inflammatory changes. Spleen: Normal in size without focal abnormality. Adrenals/Urinary Tract: Normal adrenal glands. No nephrolithiasis, hydronephrosis or suspicious mass. Urinary bladder appears normal. Stomach/Bowel: Stomach appears normal. Inflammatory fat stranding identified within the right lower quadrant of the abdomen. The appendix is surgically absent. Several small foci of extraluminal gas identified adjacent to the cecum, image 68/3 and image 66/3. Increased caliber of the small bowel loops is again noted which appears stable to improved in the interval, likely reflecting ileus. Surgical drainage catheter is again seen entering from of the left lower quadrant approach. The tip of the catheter terminates along the right pericolic gutter adjacent to the inferior margin of the right lobe of liver. The previously noted crescent shaped fluid collection within the posterior pelvis, anterior to the rectum, has decreased in volume from previous exam. On today's study this measures 4.9 x 2.0 by 1.4 cm (volume = 7.2 cm^3), image 77/3 and image 55/6. The percutaneous, pigtail drainage catheter placed on 05/05/2023 is noted along the right inferior margin of this fluid collection, image 77/3. Formally this measured 6.8 x 3.8 by 3.9 cm (volume = 53 cm^3). As before there is a tubular shaped fluid collection extending along the right pelvic sidewall measuring 2.4 x 1.7 by 5.8 cm (volume = 12 cm^3), image 82/7 and image 69/3. On the previous exam this measured 2.8 by 1.6 by 7.2 cm (volume = 17 cm^3). No new fluid collections identified. Vascular/Lymphatic: Aortic atherosclerosis without aneurysm. No abdominopelvic adenopathy. Reproductive: Prostate is  unremarkable. Other: Postoperative changes identified along the midline of  the ventral abdominal wall. Small punctate foci of gas within the subcutaneous soft tissues noted compatible with postop change. No fluid collection identified within the abdominal wall. Musculoskeletal: Lumbar degenerative disc disease. No acute or suspicious osseous findings. IMPRESSION: 1. Interval decrease in volume of crescent shaped fluid collection within the posterior pelvis, anterior to the rectum. The percutaneous, pigtail drainage catheter placed on 05/05/2023 is noted along the right inferior margin of this fluid collection. 2. Slight decrease size of tubular shaped fluid collection extending along the right pelvic sidewall measuring 2.4 x 1.7 by 5.8 cm. 3. Persistent increased caliber of the small bowel loops is again noted which appears stable to improved in the interval, likely reflecting ileus. 4. Small right pleural effusion. 5.  Aortic Atherosclerosis (ICD10-I70.0). Electronically Signed   By: Signa Kell M.D.   On: 05/13/2023 14:02   DG Chest Port 1 View  Result Date: 05/11/2023 CLINICAL DATA:  Dyspnea. EXAM: PORTABLE CHEST 1 VIEW COMPARISON:  May 07, 2023. FINDINGS: Stable cardiomediastinal silhouette. Left lung is clear. Minimal right basilar subsegmental atelectasis is noted. The thorax is unremarkable. IMPRESSION: Minimal right basilar subsegmental atelectasis. Electronically Signed   By: Lupita Raider M.D.   On: 05/11/2023 15:27   DG CHEST PORT 1 VIEW  Result Date: 05/07/2023 CLINICAL DATA:  Wheezing, nasal congestion EXAM: PORTABLE CHEST 1 VIEW COMPARISON:  05/05/2023 FINDINGS: 2 frontal views of the chest demonstrate a stable cardiac silhouette. No acute airspace disease, effusion, or pneumothorax. Improved interstitial prominence at the lung bases, with residual densities at the right lung base. Persistent central pulmonary vascular congestion. No acute bony abnormalities. IMPRESSION: 1.  Persistent vascular congestion, with marked improvement in bibasilar interstitial prominence seen previously consistent with resolving interstitial edema. Electronically Signed   By: Sharlet Salina M.D.   On: 05/07/2023 09:22   DG CHEST PORT 1 VIEW  Result Date: 05/05/2023 CLINICAL DATA:  Shortness of breath. EXAM: PORTABLE CHEST 1 VIEW COMPARISON:  05/02/2023 FINDINGS: Right apex obscured by the patient's face. Vascular congestion with interstitial opacity at the bases, potentially dependent edema. No evidence for pneumothorax or focal consolidation. No substantial pleural effusion. Cardiopericardial silhouette is at upper limits of normal for size. Telemetry leads overlie the chest. IMPRESSION: Vascular congestion with interstitial opacity at the bases, potentially dependent edema. Electronically Signed   By: Kennith Center M.D.   On: 05/05/2023 14:09   CT GUIDED PERITONEAL/RETROPERITONEAL FLUID DRAIN BY PERC CATH  Result Date: 05/05/2023 INDICATION: 67 year old male status post appendectomy complicated postoperatively by pelvic fluid collection concerning for abscess. EXAM: CT PERC DRAIN PERITONEAL ABCESS COMPARISON:  CT abdomen pelvis from 05/04/2023 MEDICATIONS: The patient is currently admitted to the hospital and receiving intravenous antibiotics. The antibiotics were administered within an appropriate time frame prior to the initiation of the procedure. ANESTHESIA/SEDATION: Moderate (conscious) sedation was employed during this procedure. A total of Versed mg and Fentanyl mcg was administered intravenously. Moderate Sedation Time: 10 minutes. The patient's level of consciousness and vital signs were monitored continuously by radiology nursing throughout the procedure under my direct supervision. CONTRAST:  None COMPLICATIONS: None immediate. PROCEDURE: RADIATION DOSE REDUCTION: This exam was performed according to the departmental dose-optimization program which includes automated exposure control,  adjustment of the mA and/or kV according to patient size and/or use of iterative reconstruction technique. Informed written consent was obtained from the patient and family member after a discussion of the risks, benefits and alternatives to treatment. The patient was placed prone on the CT gantry and a  pre procedural CT was performed re-demonstrating the known abscess/fluid collection within the pelvis. The procedure was planned. A timeout was performed prior to the initiation of the procedure. The right gluteal region was prepped and draped in the usual sterile fashion. The overlying soft tissues were anesthetized with 1% lidocaine with epinephrine. Appropriate trajectory was planned with the use of a 22 gauge spinal needle. An 18 gauge trocar needle was advanced into the abscess/fluid collection and a short Amplatz super stiff wire was coiled within the collection. Appropriate positioning was confirmed with a limited CT scan. The tract was serially dilated allowing placement of a 8 [place] all-purpose drainage catheter. Appropriate positioning was confirmed with a limited postprocedural CT scan. Proximally 75 ml of purulent fluid was aspirated. The tube was connected to a bulb suction and sutured in place. A dressing was placed. The patient tolerated the procedure well without immediate post procedural complication. IMPRESSION: Successful CT guided placement of a right transgluteal 8 [place] all purpose drain catheter into the pelvic abscess with aspiration of approximately 75 mL of purulent fluid. Samples were sent to the laboratory as requested by the ordering clinical team. Marliss Coots, MD [facility] Electronically Signed   By: Marliss Coots M.D.   On: 05/05/2023 14:00   CT ABDOMEN PELVIS WO CONTRAST  Result Date: 05/04/2023 CLINICAL DATA:  Peritonitis, bowel perforation, right lower quadrant abdominal pain EXAM: CT ABDOMEN AND PELVIS WITHOUT  CONTRAST TECHNIQUE: Multidetector CT imaging of the abdomen and pelvis was performed following the standard protocol without IV contrast. RADIATION DOSE REDUCTION: This exam was performed according to the departmental dose-optimization program which includes automated exposure control, adjustment of the mA and/or kV according to patient size and/or use of iterative reconstruction technique. COMPARISON:  04/30/2023 FINDINGS: Lower chest: Advanced emphysema noted within the visualized lung bases. Small right pleural effusion has developed. Hypoattenuation of the cardiac blood pool is in keeping with at least mild anemia. Moderate right coronary artery calcification noted. Hepatobiliary: No focal liver abnormality is seen. No gallstones, gallbladder wall thickening, or biliary dilatation. Pancreas: Unremarkable. No pancreatic ductal dilatation or surrounding inflammatory changes. Spleen: Normal in size without focal abnormality. Adrenals/Urinary Tract: Adrenal glands are unremarkable. Kidneys are normal, without renal calculi, focal lesion, or hydronephrosis. Bladder is unremarkable. Stomach/Bowel: Interval appendectomy. Left lower quadrant surgical drainage catheter loops within the deep pelvis and courses along the right peritoneal wall the level of the mid abdomen. There is extensive infiltration within the surgical bed in keeping with residual inflammatory or postsurgical change. There is a crescentic loculated fluid collection within the deep pelvis extending to this area of inflammatory change superiorly containing several punctate locules of gas suspicious for a developing deep pelvic abscess measuring at least 3.8 x 3.9 x 6.5 cm in dimension on axial image # 76 and coronal image # 60. There is mild infiltration of the peritoneal fat within the right mid abdomen and pelvis as well as enhancement of the peritoneal lining which may reflect changes of peritonitis. The proximal and mid small bowel appears diffusely  mildly dilated and fluid-filled without a discrete point of transition identified in keeping with changes of an adynamic ileus. No free intraperitoneal gas or fluid. Vascular/Lymphatic: Extensive aortoiliac atherosclerotic calcification. No aortic aneurysm. No pathologic adenopathy within the abdomen and pelvis. Reproductive: Prostate is unremarkable. Other: There is extensive infiltration and punctate foci of subcutaneous gas in the region of the umbilicus likely reflecting postsurgical changes following laparoscopic surgery. No discrete subcutaneous  fluid collection identified. No hernia identified. Musculoskeletal: No acute bone abnormality. No lytic or blastic bone lesion. IMPRESSION: 1. Interval appendectomy. Crescentic fluid collection within the deep pelvis extending to this area of inflammatory change within the surgical bed containing several punctate locules of gas suspicious for a developing deep pelvic abscess measuring at least 3.8 x 3.9 x 6.5 cm in dimension. This does not communicate with the indwelling surgical drainage catheter. 2. Infiltration of peritoneal fat and peritoneal enhancement in keeping changes of peritonitis. 3. Postoperative adynamic ileus with multiple loops of dilated fluid-filled proximal and mid small bowel. 4. Small right pleural effusion. 5. Moderate right coronary artery calcification. Aortic Atherosclerosis (ICD10-I70.0) and Emphysema (ICD10-J43.9). Electronically Signed   By: Helyn Numbers M.D.   On: 05/04/2023 22:03   VAS [place] LOWER EXTREMITY VENOUS (DVT)  Result Date: 05/03/2023  Lower Venous DVT Study Patient Name:  KIPTON COTTEN  Date of Exam:   05/03/2023 Medical Rec #: [id number]     Accession #:    [id number] Date of Birth: 1956-02-21      Patient Gender: M Patient Age:   32 years Exam Location:  [facility] Procedure:      VAS [place] LOWER EXTREMITY VENOUS (DVT) Referring Phys: Zenia Resides  --------------------------------------------------------------------------------  Indications: Swelling, and Edema.  Risk Factors: Surgery Appendectomy 05/01/23. Comparison Study: No prior study Performing Technologist: Shona Simpson  Examination Guidelines: A complete evaluation includes B-mode imaging, spectral Doppler, color Doppler, and power Doppler as needed of all accessible portions of each vessel. Bilateral testing is considered an integral part of a complete examination. Limited examinations for reoccurring indications may be performed as noted. The reflux portion of the exam is performed with the patient in reverse Trendelenburg.  +---------+---------------+---------+-----------+----------+--------------+ RIGHT    CompressibilityPhasicitySpontaneityPropertiesThrombus Aging +---------+---------------+---------+-----------+----------+--------------+ CFV      Full           Yes      Yes                                 +---------+---------------+---------+-----------+----------+--------------+ SFJ      Full                                                        +---------+---------------+---------+-----------+----------+--------------+ FV Prox  Full                                                        +---------+---------------+---------+-----------+----------+--------------+ FV Mid   Full                                                        +---------+---------------+---------+-----------+----------+--------------+ FV DistalFull                                                        +---------+---------------+---------+-----------+----------+--------------+  PFV      Full                                                        +---------+---------------+---------+-----------+----------+--------------+ POP      Full           Yes      Yes                                 +---------+---------------+---------+-----------+----------+--------------+ PTV       Full                                                        +---------+---------------+---------+-----------+----------+--------------+ PERO     Full                                                        +---------+---------------+---------+-----------+----------+--------------+   +---------+---------------+---------+-----------+----------+--------------+ LEFT     CompressibilityPhasicitySpontaneityPropertiesThrombus Aging +---------+---------------+---------+-----------+----------+--------------+ CFV      Full           Yes      Yes                                 +---------+---------------+---------+-----------+----------+--------------+ SFJ      Full                                                        +---------+---------------+---------+-----------+----------+--------------+ FV Prox  Full                                                        +---------+---------------+---------+-----------+----------+--------------+ FV Mid   Full                                                        +---------+---------------+---------+-----------+----------+--------------+ FV DistalFull                                                        +---------+---------------+---------+-----------+----------+--------------+ PFV      Full                                                        +---------+---------------+---------+-----------+----------+--------------+  POP      Full           Yes      Yes                                 +---------+---------------+---------+-----------+----------+--------------+ PTV      Full                                                        +---------+---------------+---------+-----------+----------+--------------+ PERO     Full                                                        +---------+---------------+---------+-----------+----------+--------------+     Summary: BILATERAL: - No evidence of deep vein  thrombosis seen in the lower extremities, bilaterally. -No evidence of popliteal cyst, bilaterally.   *See table(s) above for measurements and observations. Electronically signed by Gerarda Fraction on 05/03/2023 at 3:40:10 PM.    Final    Overnight EEG with video  Result Date: 05/03/2023 Charlsie Quest, MD     05/03/2023  7:38 PM Patient Name: Gian Mumper MRN: [id number] Epilepsy Attending: Charlsie Quest Referring Physician/Provider: Gevena Mart, NP Duration: 05/02/2023 1937 to 05/03/2023 1550 Patient history: 67 yo M with arm tremors, hx of tremors with LOC, frequency increased in the last 3 months, had episode today without LOC. EEG to evaluate for seizure. Level of alertness: Awake, asleep AEDs during EEG study: GBP Technical aspects: This EEG study was done with scalp electrodes positioned according to the 10-20 International system of electrode placement. Electrical activity was reviewed with band pass filter of 1-70Hz , sensitivity of 7 uV/mm, display speed of 88mm/sec with a 60Hz  notched filter applied as appropriate. EEG data were recorded continuously and digitally stored.  Video monitoring was available and reviewed as appropriate. Description: The posterior dominant rhythm consists of 8 Hz activity of moderate voltage (25-35 uV) seen predominantly in posterior head regions, symmetric and reactive to eye opening and eye closing. Sleep was characterized by vertex waves, sleep spindles (12 to 14 Hz), maximal frontocentral region.  Event button was pressed over 50 times after 05/03/2023 0416 for tremors of the upper extremity. Concomitant EEG before, during and after the event showed normal posterior dominant rhythm, did not show any EEG changes suggest seizure. Hyperventilation and photic stimulation were not performed.   IMPRESSION: This study is within normal limits. No seizures or epileptiform discharges were seen throughout the recording. Event button was pressed several times during the study  for tremors of upper extremity without concomitant EEG change.  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 Charlsie Quest   NM Pulmonary Perf and Vent  Result Date: 05/02/2023 CLINICAL DATA:  Evaluate for pulmonary embolism. EXAM: NUCLEAR MEDICINE PERFUSION LUNG SCAN TECHNIQUE: Perfusion images were obtained in multiple projections after intravenous injection of radiopharmaceutical. Ventilation scans intentionally deferred if perfusion scan and chest x-ray adequate for interpretation during COVID 19 epidemic. RADIOPHARMACEUTICALS:  4 mCi Tc-5m MAA IV COMPARISON:  Chest radiograph-earlier same day; 04/30/2023 Chest CT-07/12/2022 FINDINGS: Review of chest radiograph performed earlier same day demonstrates unchanged enlarged cardiac silhouette and  mediastinal contours. Old left-sided rib fractures. No focal airspace opacities. No pleural effusion or pneumothorax. No evidence of edema Perfusion imaging demonstrates mild heterogeneous perfusion of the bilateral pulmonary parenchyma without discrete area of non perfusion to suggest pulmonary embolism. IMPRESSION: Pulmonary embolism absent. No discrete areas of non perfusion to suggest pulmonary embolism. Electronically Signed   By: Simonne Come M.D.   On: 05/02/2023 17:35   DG CHEST PORT 1 VIEW  Result Date: 05/02/2023 CLINICAL DATA:  Dyspnea EXAM: PORTABLE CHEST 1 VIEW COMPARISON:  None Available. FINDINGS: Normal mediastinum and cardiac silhouette. Normal pulmonary vasculature. No evidence of effusion, infiltrate, or pneumothorax. No acute bony abnormality. IMPRESSION: Normal chest radiograph Electronically Signed   By: Genevive Bi M.D.   On: 05/02/2023 16:32   VAS [place] IVC/ILIAC (VENOUS ONLY)  Result Date: 05/02/2023 IVC/ILIAC STUDY Patient Name:  MAXEMILIANO MONTNEY  Date of Exam:   05/02/2023 Medical Rec #: [id number]     Accession #:    [id number] Date of Birth: [birth year]      Patient Gender: M Patient Age:   29 years Exam Location:  [facility] Procedure:       VAS [place] IVC/ILIAC (VENOUS ONLY) Referring Phys: Jonah Blue --------------------------------------------------------------------------------  Indications: A vague density area of concern in the IVC was noted on echo exam. Limitations: Air/bowel gas and Status post appendectomy laparoscopic 05/01/23.  Performing Technologist: Marilynne Halsted RDMS, RVT  Examination Guidelines: A complete evaluation includes B-mode imaging, spectral Doppler, color Doppler, and power Doppler as needed of all accessible portions of each vessel. Bilateral testing is considered an integral part of a complete examination. Limited examinations for reoccurring indications may be performed as noted.  IVC/Iliac Findings: +----------+------+--------+-----------------+    IVC    PatentThrombus    Comments      +----------+------+--------+-----------------+ IVC Prox                poorly visualized +----------+------+--------+-----------------+ IVC Mid                 poorly visualized +----------+------+--------+-----------------+ IVC Distal              not visualized    +----------+------+--------+-----------------+    Summary: IVC/Iliac: Visualization of proximal Inferior Vena Cava, mid inferior vena cava and distal Inferior Vena Cava was limited. 2D imaging with color evaluation of the IVC was non- diagnostic due to patient's body habitus and air in the abdominal cavity.  *See table(s) above for measurements and observations.  Electronically signed by Carolynn Sayers on 05/02/2023 at 4:10:22 PM.   Final    ECHOCARDIOGRAM COMPLETE  Result Date: 05/02/2023    ECHOCARDIOGRAM REPORT   Patient Name:   VIGNESH PETTRY Date of Exam: 05/02/2023 Medical Rec #:  [id number]    Height:       75.0 in Accession #:    [id number]   Weight:       219.0 lb Date of Birth:  September 09, 1955     BSA:          2.281 m Patient Age:    67 years     BP:           107/69 mmHg Patient Gender: M            HR:           104 bpm. Exam Location:  Inpatient  Procedure: 2D Echo, Color Doppler, Cardiac Doppler and Intracardiac            Opacification Agent Indications:  R55 Syncope  History:        Patient has prior history of Echocardiogram examinations, most                 recent 12/13/2021. CAD, COPD, Arrythmias:Atrial Fibrillation; Risk                 Factors:Hypertension and Diabetes.  Sonographer:    Irving Burton Senior RDCS Referring Phys: [id number] Francena Hanly [facility]  Sonographer Comments: Very poor echo windows due to body habitus and COPD IMPRESSIONS  1. Left ventricular ejection fraction, by estimation, is 55 to 60%. The left ventricle has normal function. The left ventricle has no regional wall motion abnormalities. Left ventricular diastolic parameters are consistent with Grade II diastolic dysfunction (pseudonormalization).  2. Right ventricular systolic function is moderately reduced. The right ventricular size is moderately enlarged. Tricuspid regurgitation signal is inadequate for assessing PA pressure.  3. Left atrial size was mildly dilated.  4. The mitral valve is degenerative. No evidence of mitral valve regurgitation. No evidence of mitral stenosis.  5. The aortic valve is tricuspid. Aortic valve regurgitation is not visualized. Aortic valve sclerosis/calcification is present, without any evidence of aortic stenosis.  6. There is a vague density in the IVC. Cannot rule out thrombus. Recommend dedicated Abdominal CTA vs. Abdominal US for further evaluation. The inferior vena cava is dilated in size with >50% respiratory variability, suggesting right atrial pressure of  8 mmHg.  7. In setting of syncope as well as RV dysfunction, consider acute PE. Recommend Chest CTA if clinically indicated. FINDINGS  Left Ventricle: Left ventricular ejection fraction, by estimation, is 55 to 60%. The left ventricle has normal function. The left ventricle has no regional wall motion abnormalities. Definity contrast agent was given IV to delineate the left ventricular   endocardial borders. The left ventricular internal cavity size was normal in size. There is no left ventricular hypertrophy. Left ventricular diastolic parameters are consistent with Grade II diastolic dysfunction (pseudonormalization). Normal left ventricular filling pressure. Right Ventricle: The right ventricular size is moderately enlarged. No increase in right ventricular wall thickness. Right ventricular systolic function is moderately reduced. Tricuspid regurgitation signal is inadequate for assessing PA pressure. Left Atrium: Left atrial size was mildly dilated. Right Atrium: Right atrial size was normal in size. Pericardium: There is no evidence of pericardial effusion. Presence of epicardial fat layer. Mitral Valve: The mitral valve is degenerative in appearance. There is mild calcification of the mitral valve leaflet(s). Mild to moderate mitral annular calcification. No evidence of mitral valve regurgitation. No evidence of mitral valve stenosis. Tricuspid Valve: The tricuspid valve is normal in structure. Tricuspid valve regurgitation is trivial. No evidence of tricuspid stenosis. Aortic Valve: The aortic valve is tricuspid. Aortic valve regurgitation is not visualized. Aortic valve sclerosis/calcification is present, without any evidence of aortic stenosis. Pulmonic Valve: The pulmonic valve was normal in structure. Pulmonic valve regurgitation is not visualized. No evidence of pulmonic stenosis. Aorta: The aortic root is normal in size and structure. Venous: There is a vague density in the IVC. Cannot rule out thrombus. Recommend dedicated Abdominal CTA vs. Abdominal US for further evaluation. The inferior vena cava is dilated in size with greater than 50% respiratory variability, suggesting right atrial pressure of 8 mmHg. IAS/Shunts: No atrial level shunt detected by color flow Doppler.  LEFT VENTRICLE PLAX 2D LVIDd:         4.90 cm     Diastology LVIDs:         3.80  cm     LV e' medial:    6.20 cm/s  LV PW:         1.10 cm     LV E/e' medial:  10.9 LV IVS:        1.00 cm     LV e' lateral:   6.31 cm/s LVOT diam:     2.10 cm     LV E/e' lateral: 10.7 LV SV:         47 LV SV Index:   21 LVOT Area:     3.46 cm  LV Volumes (MOD) LV vol d, MOD A4C: 98.9 ml LV vol s, MOD A4C: 57.8 ml LV SV MOD A4C:     98.9 ml RIGHT VENTRICLE RV S prime:     9.36 cm/s TAPSE (M-mode): 1.9 cm LEFT ATRIUM             Index        RIGHT ATRIUM           Index LA diam:        3.30 cm 1.45 cm/m   RA Area:     23.70 cm LA Vol (A2C):   80.6 ml 35.34 ml/m  RA Volume:   71.30 ml  31.26 ml/m LA Vol (A4C):   74.4 ml 32.62 ml/m LA Biplane Vol: 81.7 ml 35.82 ml/m  AORTIC VALVE LVOT Vmax:   88.10 cm/s LVOT Vmean:  65.100 cm/s LVOT VTI:    0.135 m  AORTA Ao Root diam: 3.40 cm MITRAL VALVE MV Area (PHT): 3.39 cm    SHUNTS MV Decel Time: 224 msec    Systemic VTI:  0.14 m MV E velocity: 67.30 cm/s  Systemic Diam: 2.10 cm MV A velocity: 60.80 cm/s MV E/A ratio:  1.11 Armanda Magic MD Electronically signed by Armanda Magic MD Signature Date/Time: 05/02/2023/12:22:10 PM    Final    DG Chest Port 1 View  Result Date: 04/30/2023 CLINICAL DATA:  Shortness of breath and headache EXAM: PORTABLE CHEST 1 VIEW COMPARISON:  12/26/2022 FINDINGS: Numerous leads and wires project over the chest. Remote left rib fractures. Midline trachea. Borderline cardiomegaly. Atherosclerosis in the transverse aorta. Left costophrenic angle excluded. No pleural effusion or pneumothorax. Chronic interstitial thickening/coarsening there is likely related to COPD/chronic bronchitis given clinical history of smoking. No lobar consolidation. IMPRESSION: No acute cardiopulmonary disease. Peribronchial thickening which may relate to chronic bronchitis or smoking. Aortic Atherosclerosis (ICD10-I70.0). Electronically Signed   By: Jeronimo Greaves M.D.   On: 04/30/2023 16:55   CT ABDOMEN PELVIS W CONTRAST  Result Date: 04/30/2023 CLINICAL DATA:  Right lower quadrant pain, no  bowel movement since last Thursday, testicular pain EXAM: CT ABDOMEN AND PELVIS WITH CONTRAST TECHNIQUE: Multidetector CT imaging of the abdomen and pelvis was performed using the standard protocol following bolus administration of intravenous contrast. RADIATION DOSE REDUCTION: This exam was performed according to the departmental dose-optimization program which includes automated exposure control, adjustment of the mA and/or kV according to patient size and/or use of iterative reconstruction technique. CONTRAST:  60mL OMNIPAQUE IOHEXOL 350 MG/ML SOLN COMPARISON:  11/18/2020 FINDINGS: Lower chest: Emphysema. No acute pleural or parenchymal lung disease. Hepatobiliary: No focal liver abnormality is seen. No gallstones, gallbladder wall thickening, or biliary dilatation. Pancreas: Unremarkable. No pancreatic ductal dilatation or surrounding inflammatory changes. Spleen: Normal in size without focal abnormality. Adrenals/Urinary Tract: Adrenal glands are unremarkable. Kidneys are normal, without renal calculi, focal lesion, or hydronephrosis. Bladder is unremarkable. Stomach/Bowel: There is a dilated inflamed  appendix within the right lower quadrant, measuring up to 16 mm in diameter. And appendicolith is seen at the appendiceal orifice. Marked periappendiceal fat stranding with evidence of micro perforation at the base of the appendix. No fluid collection or abscess. No bowel obstruction or ileus. Vascular/Lymphatic: Subcentimeter right lower quadrant mesenteric lymph nodes are likely reactive. No pathologic adenopathy. Atherosclerosis of the aorta and its branches. Reproductive: Prostate is unremarkable. Other: Mesenteric edema and trace free fluid within the right lower quadrant. Punctate extraluminal gas adjacent to the base of the appendix consistent with micro perforation. No other evidence of pneumoperitoneum. No abdominal wall hernia. Musculoskeletal: No acute or destructive bony abnormalities. Reconstructed  images demonstrate no additional findings. IMPRESSION: 1. Acute perforated appendicitis.  No fluid collection or abscess. 2.  Aortic Atherosclerosis (ICD10-I70.0). Critical Value/emergent results were called by telephone at the time of interpretation on 04/30/2023 at 4:29 pm to provider [facility], who verbally acknowledged these results. Electronically Signed   By: Sharlet Salina M.D.   On: 04/30/2023 16:32   US SCROTUM W/DOPPLER  Result Date: 04/30/2023 CLINICAL DATA:  Left testicular pain EXAM: SCROTAL ULTRASOUND DOPPLER ULTRASOUND OF THE TESTICLES TECHNIQUE: Complete ultrasound examination of the testicles, epididymis, and other scrotal structures was performed. Color and spectral Doppler ultrasound were also utilized to evaluate blood flow to the testicles. COMPARISON:  11/18/2020 FINDINGS: Right testicle Measurements: 2.5 x 4.0 x 2.2 cm. No mass or microlithiasis visualized. Left testicle Measurements: 2.1 x 3.9 x 1.7 cm. No mass or microlithiasis visualized. Right epididymis: Incidental 3 mm epididymal cyst of doubtful clinical significance. Otherwise normal size and appearance. Left epididymis: Incidental 2 mm epididymal cyst of doubtful clinical significance. Otherwise normal size and appearance. Hydrocele:  None visualized. Varicocele:  None visualized. Pulsed Doppler interrogation of both testes demonstrates normal low resistance arterial and venous waveforms bilaterally. IMPRESSION: 1. Unremarkable testicular ultrasound. 2. Small bilateral epididymal cysts of likely no clinical significance. Electronically Signed   By: Sharlet Salina M.D.   On: 04/30/2023 16:11   CT Head Wo Contrast  Result Date: 04/30/2023 CLINICAL DATA:  frontal HA, sepsis EXAM: CT HEAD WITHOUT CONTRAST TECHNIQUE: Contiguous axial images were obtained from the base of the skull through the vertex without intravenous contrast. RADIATION DOSE REDUCTION: This exam was performed according to the departmental dose-optimization  program which includes automated exposure control, adjustment of the mA and/or kV according to patient size and/or use of iterative reconstruction technique. COMPARISON:  None Available. FINDINGS: Brain: No hemorrhage. No hydrocephalus. No extra-axial fluid collection. No CT evidence of an acute cortical infarct. No mass effect. No mass lesion. Mineralization of the basal ganglia bilaterally. Vascular: No hyperdense vessel or unexpected calcification. Skull: Normal. Negative for fracture or focal lesion. Sinuses/Orbits: No middle ear effusion. There is a trace left mastoid effusion. Paranasal sinuses are clear. Bilateral lens replacement. Orbits are otherwise unremarkable. Other: None. IMPRESSION: 1. No CT finding to explain frontal headache. 2. Trace left mastoid effusion. Electronically Signed   By: Lorenza Cambridge M.D.   On: 04/30/2023 14:44    Labs:  CBC: Recent Labs    05/11/23 0347 05/12/23 0625 05/13/23 0459 05/14/23 0443  WBC 12.2* 12.8* 13.6* 12.2*  HGB 8.9* 8.6* 8.2* 7.4*  HCT 31.1* 30.6* 28.8* 26.3*  PLT 514* 460* 494* 470*    COAGS: Recent Labs    04/30/23 1144 05/05/23 0848  INR 1.7* 1.0  APTT 45*  --     BMP: Recent Labs    05/11/23 0347 05/12/23 0625 05/13/23  0459 05/14/23 0443  NA 139 141 141 138  K 3.7 4.6 4.7 4.6  CL 96* 95* 93* 94*  CO2 38* 37* 40* 37*  GLUCOSE 145* 117* 106* 143*  BUN 8 10 12 11   CALCIUM 8.2* 8.8* 9.0 8.5*  CREATININE 0.82 0.94 1.02 0.75  GFRNONAA >60 >60 >60 >60    LIVER FUNCTION TESTS: Recent Labs    12/26/22 0939 04/30/23 1144 05/02/23 0755 05/03/23 1523 05/04/23 0550  BILITOT 0.4 1.3* 0.4 0.5  --   AST 23 32 21 36  --   ALT 21 18 14 16   --   ALKPHOS 66 72 57 100  --   PROT 6.5 7.3 6.0* 6.1*  --   ALBUMIN 3.5 3.7 2.7* 2.4* 2.2*     Assessment and Plan:   67 y.o male inpatient. post appendectomy complicated postoperatively by pelvic fluid collection concerning for abscess. Presented to ED on 04/30/23 with RLQ and  testicle pain, workup showed sepsis and perforated appendicitis. Patient underwent laparoscopic appendectomy on 05/01/23,,CT A/P was obtained on 05/04/23 due to worsening leukocytosis which showed post op abscess in the deep pelvis. On 11.24.24 IR placed a 8 Fr access drain into pelvic abscess with aspiration of 75 ml. Cultures grew abundant e.coli, klebsella pneumonia, bactericides argils, bactericides cascade and lactamase cacao. Output has been: 5 ml. 10 ml. 20 ml.  CT abd pelvis from 12.2.24 reads:  interval decrease in volume of crescent shaped fluid collection within the posterior pelvis, anterior to the rectum. The percutaneous, pigtail drainage catheter placed on 05/05/2023 is noted along the right inferior margin of this fluid collection.  PLAN:  Pelvic abscess drain exchange, possible reposition, possible upsize.  Risks and benefits discussed with the patient including bleeding, infection, damage to adjacent structures, bowel perforation/fistula connection, and sepsis.  All of the patient's questions were answered, patient is agreeable to proceed. Consent signed and in chart.  Thank you for this interesting consult.  I greatly enjoyed meeting [facility] and look forward to participating in their care.  A copy of this report was sent to the requesting provider on this date.  Electronically Signed: Alene Mires, NP 05/14/2023, 9:51 AM   I spent a total of 20 Minutes    in face to face in clinical consultation, greater than 50% of which was counseling/coordinating care for pelvic abscess drain exchange possible upsize possible reposition.

## 2023-05-14 NOTE — Progress Notes (Signed)
Patient ID: Jared [name], male   DOB: 1956-05-25, 67 y.o.   MRN: [id number] [facility] Surgery Progress Note  13 Days Post-Op  Subjective: CC-  Complaining of some itching around incision site today. Otherwise no significant abdominal pain. Denies n/v. States that he is eating but still not eating a lot. He drank 2-3 protein shakes yesterday. Passing flatus, last BM 3 days ago. WBC down 12.2, afebrile. CT yesterday with improving fluid collections, drain may be slightly retracted.  Objective: Vital signs in last 24 hours: Temp:  [97.2 F (36.2 C)-98.1 F (36.7 C)] 98 F (36.7 C) (12/03 0804) Pulse Rate:  [77-101] 77 (12/03 0804) Resp:  [12-21] 14 (12/03 0804) BP: (119-132)/(66-78) 123/73 (12/03 0804) SpO2:  [96 %-100 %] 100 % (12/03 0000) Weight:  [105 kg] 105 kg (12/03 0400) Last BM Date : 05/13/23  Intake/Output from previous day: 12/02 0701 - 12/03 0700 In: 1205 [P.O.:1200] Out: 1445 [Urine:1400; Drains:45] Intake/Output this shift: No intake/output data recorded.  PE: Gen:  Alert, NAD Abd: soft, mild distension, bowel sounds present, wound at umbilicus open and clean without drainage, nontender. Left sided surgical drain scant serosanguinous fluid in bulb, IR transgluteal drain scant serosanguinous fluid in bulb  Lab Results:  Recent Labs    05/13/23 0459 05/14/23 0443  WBC 13.6* 12.2*  HGB 8.2* 7.4*  HCT 28.8* 26.3*  PLT 494* 470*   BMET Recent Labs    05/13/23 0459 05/14/23 0443  NA 141 138  K 4.7 4.6  CL 93* 94*  CO2 40* 37*  GLUCOSE 106* 143*  BUN 12 11  CREATININE 1.02 0.75  CALCIUM 9.0 8.5*   PT/INR No results for input(s): "LABPROT", "INR" in the last 72 hours. CMP     Component Value Date/Time   NA 138 05/14/2023 0443   NA 142 10/25/2022 1027   K 4.6 05/14/2023 0443   CL 94 (L) 05/14/2023 0443   CO2 37 (H) 05/14/2023 0443   GLUCOSE 143 (H) 05/14/2023 0443   BUN 11 05/14/2023 0443   BUN 14 10/25/2022 1027   CREATININE 0.75  05/14/2023 0443   CREATININE 1.20 07/30/2016 1431   CALCIUM 8.5 (L) 05/14/2023 0443   PROT 6.1 (L) 05/03/2023 1523   PROT 6.7 10/25/2022 1027   ALBUMIN 2.2 (L) 05/04/2023 0550   ALBUMIN 4.4 10/25/2022 1027   AST 36 05/03/2023 1523   ALT 16 05/03/2023 1523   ALKPHOS 100 05/03/2023 1523   BILITOT 0.5 05/03/2023 1523   BILITOT 0.3 10/25/2022 1027   GFRNONAA >60 05/14/2023 0443   GFRNONAA 64 07/30/2016 1431   GFRAA 91 01/29/2020 1020   GFRAA 74 07/30/2016 1431   Lipase     Component Value Date/Time   LIPASE 41 09/20/2021 1620       Studies/Results: CT ABDOMEN PELVIS W CONTRAST  Result Date: 05/13/2023 CLINICAL DATA:  Follow-up perforated appendicitis. EXAM: CT ABDOMEN AND PELVIS WITH CONTRAST TECHNIQUE: Multidetector CT imaging of the abdomen and pelvis was performed using the standard protocol following bolus administration of intravenous contrast. RADIATION DOSE REDUCTION: This exam was performed according to the departmental dose-optimization program which includes automated exposure control, adjustment of the mA and/or kV according to patient size and/or use of iterative reconstruction technique. CONTRAST:  75mL OMNIPAQUE IOHEXOL 350 MG/ML SOLN COMPARISON:  05/04/2023 FINDINGS: Lower chest: Small right pleural effusion. Hepatobiliary: No focal liver abnormality is seen. No gallstones, gallbladder wall thickening, or biliary dilatation. Pancreas: Unremarkable. No pancreatic ductal dilatation or surrounding inflammatory changes. Spleen: Normal in  size without focal abnormality. Adrenals/Urinary Tract: Normal adrenal glands. No nephrolithiasis, hydronephrosis or suspicious mass. Urinary bladder appears normal. Stomach/Bowel: Stomach appears normal. Inflammatory fat stranding identified within the right lower quadrant of the abdomen. The appendix is surgically absent. Several small foci of extraluminal gas identified adjacent to the cecum, image 68/3 and image 66/3. Increased caliber of the  small bowel loops is again noted which appears stable to improved in the interval, likely reflecting ileus. Surgical drainage catheter is again seen entering from of the left lower quadrant approach. The tip of the catheter terminates along the right pericolic gutter adjacent to the inferior margin of the right lobe of liver. The previously noted crescent shaped fluid collection within the posterior pelvis, anterior to the rectum, has decreased in volume from previous exam. On today's study this measures 4.9 x 2.0 by 1.4 cm (volume = 7.2 cm^3), image 77/3 and image 55/6. The percutaneous, pigtail drainage catheter placed on 05/05/2023 is noted along the right inferior margin of this fluid collection, image 77/3. Formally this measured 6.8 x 3.8 by 3.9 cm (volume = 53 cm^3). As before there is a tubular shaped fluid collection extending along the right pelvic sidewall measuring 2.4 x 1.7 by 5.8 cm (volume = 12 cm^3), image 82/7 and image 69/3. On the previous exam this measured 2.8 by 1.6 by 7.2 cm (volume = 17 cm^3). No new fluid collections identified. Vascular/Lymphatic: Aortic atherosclerosis without aneurysm. No abdominopelvic adenopathy. Reproductive: Prostate is unremarkable. Other: Postoperative changes identified along the midline of the ventral abdominal wall. Small punctate foci of gas within the subcutaneous soft tissues noted compatible with postop change. No fluid collection identified within the abdominal wall. Musculoskeletal: Lumbar degenerative disc disease. No acute or suspicious osseous findings. IMPRESSION: 1. Interval decrease in volume of crescent shaped fluid collection within the posterior pelvis, anterior to the rectum. The percutaneous, pigtail drainage catheter placed on 05/05/2023 is noted along the right inferior margin of this fluid collection. 2. Slight decrease size of tubular shaped fluid collection extending along the right pelvic sidewall measuring 2.4 x 1.7 by 5.8 cm. 3.  Persistent increased caliber of the small bowel loops is again noted which appears stable to improved in the interval, likely reflecting ileus. 4. Small right pleural effusion. 5.  Aortic Atherosclerosis (ICD10-I70.0). Electronically Signed   By: Signa Kell M.D.   On: 05/13/2023 14:02    Anti-infectives: Anti-infectives (From admission, onward)    Start     Dose/Rate Route Frequency Ordered Stop   05/07/23 0945  piperacillin-tazobactam (ZOSYN) IVPB 3.375 g        3.375 g 12.5 mL/hr over 240 Minutes Intravenous Every 8 hours 05/07/23 0851     05/07/23 0930  piperacillin-tazobactam (ZOSYN) IVPB 3.375 g  Status:  Discontinued        3.375 g 100 mL/hr over 30 Minutes Intravenous Every 8 hours 05/07/23 0836 05/07/23 0851   04/30/23 1930  piperacillin-tazobactam (ZOSYN) IVPB 3.375 g        3.375 g 12.5 mL/hr over 240 Minutes Intravenous Every 8 hours 04/30/23 1915 05/05/23 1940   04/30/23 1145  piperacillin-tazobactam (ZOSYN) IVPB 3.375 g        3.375 g 100 mL/hr over 30 Minutes Intravenous  Once 04/30/23 1131 04/30/23 1242        Assessment/Plan POD#13 - lap appy for perforated appendicitis 11/20 - MW - s/p IR drain placement 11/24, culture E coli and Klebsiella pneumoniae. On zosyn.  - CT yesterday 12/2 with improving fluid  collections, drain may be slightly retracted. IR for possible drain injection vs upsize if needed today. He is NPO for this. Ok for regular diet post-procedure - continue IR and surgical drain - encourage PO intake, protein shakes - continue miralax/colace. Dulcolax suppository today - oob/pulm toilet; encouraged ambulation in halls. Continue therapies - rec SNF   FEN: NPO for procedure, Boost/Ensure ID: zosyn 11/19>> VTE: xarelto   AKI, creatinine improving - nephrology s/o Syncope COPD/Chronic resp failure - on 2 lpm at baseline HTN Atrial flutter - xarelto restarted 11/29 H/o CVA PVD Tobacco use    LOS: 14 days    Franne Forts, [facility] Surgery 05/14/2023, 8:30 AM Please see Amion for pager number during day hours 7:00am-4:30pm

## 2023-05-15 ENCOUNTER — Encounter (HOSPITAL_COMMUNITY): Payer: Self-pay

## 2023-05-15 ENCOUNTER — Other Ambulatory Visit: Payer: Self-pay

## 2023-05-15 ENCOUNTER — Ambulatory Visit: Payer: Medicare HMO

## 2023-05-15 DIAGNOSIS — K3532 Acute appendicitis with perforation and localized peritonitis, without abscess: Secondary | ICD-10-CM | POA: Diagnosis not present

## 2023-05-15 DIAGNOSIS — I480 Paroxysmal atrial fibrillation: Secondary | ICD-10-CM | POA: Diagnosis not present

## 2023-05-15 LAB — BASIC METABOLIC PANEL
Anion gap: 4 — ABNORMAL LOW (ref 5–15)
BUN: 16 mg/dL (ref 8–23)
CO2: 39 mmol/L — ABNORMAL HIGH (ref 22–32)
Calcium: 8.5 mg/dL — ABNORMAL LOW (ref 8.9–10.3)
Chloride: 93 mmol/L — ABNORMAL LOW (ref 98–111)
Creatinine, Ser: 0.86 mg/dL (ref 0.61–1.24)
GFR, Estimated: >60 mL/min (ref >60)
Glucose, Bld: 155 mg/dL — ABNORMAL HIGH (ref 70–99)
Potassium: 5 mmol/L (ref 3.5–5.1)
Sodium: 136 mmol/L (ref 135–145)

## 2023-05-15 LAB — CBC
HCT: 24.3 % — ABNORMAL LOW (ref 39.0–52.0)
Hemoglobin: 7 g/dL — ABNORMAL LOW (ref 13.0–17.0)
MCH: 26.8 pg (ref 26.0–34.0)
MCHC: 28.8 g/dL — ABNORMAL LOW (ref 30.0–36.0)
MCV: 93.1 fL (ref 80.0–100.0)
Platelets: 521 10*3/uL — ABNORMAL HIGH (ref 150–400)
RBC: 2.61 MIL/uL — ABNORMAL LOW (ref 4.22–5.81)
RDW: 17.4 % — ABNORMAL HIGH (ref 11.5–15.5)
WBC: 10.2 10*3/uL (ref 4.0–10.5)
nRBC: 0 % (ref 0.0–0.2)

## 2023-05-15 LAB — PHOSPHORUS: Phosphorus: 3 mg/dL (ref 2.5–4.6)

## 2023-05-15 LAB — GLUCOSE, CAPILLARY
Glucose-Capillary: 175 mg/dL — ABNORMAL HIGH (ref 70–99)
Glucose-Capillary: 150 mg/dL — ABNORMAL HIGH (ref 70–99)
Glucose-Capillary: 136 mg/dL — ABNORMAL HIGH (ref 70–99)
Glucose-Capillary: 122 mg/dL — ABNORMAL HIGH (ref 70–99)

## 2023-05-15 NOTE — Progress Notes (Signed)
Patient ID: Jared [name], male   DOB: August 08, 1955, 67 y.o.   MRN: [id number] [facility] Surgery Progress Note  14 Days Post-Op  Subjective: CC-  No abdominal complaints. Tolerating PO. Denies n/v. Passing flatus, BM yesterday. S/p IR drain manipulation and exchange 12/3. WBC normalized, afebrile.  Objective: Vital signs in last 24 hours: Temp:  [97.5 F (36.4 C)-98.2 F (36.8 C)] 97.5 F (36.4 C) (12/04 0830) Pulse Rate:  [76-95] 85 (12/04 0830) Resp:  [17-22] 19 (12/04 0830) BP: (98-135)/(65-86) 115/86 (12/04 0830) SpO2:  [94 %-100 %] 100 % (12/04 0830) Weight:  [110.1 kg] 110.1 kg (12/04 0300) Last BM Date : 05/14/23  Intake/Output from previous day: 12/03 0701 - 12/04 0700 In: 20  Out: 1200 [Urine:1150; Drains:50] Intake/Output this shift: No intake/output data recorded.  PE: Gen:  Alert, NAD Abd: soft, mild distension, bowel sounds present, wound at umbilicus open and clean without drainage, nontender. Left sided surgical drain scant serosanguinous fluid in bulb, IR transgluteal drain scant bloody fluid in bulb  Lab Results:  Recent Labs    05/14/23 0443 05/15/23 0436  WBC 12.2* 10.2  HGB 7.4* 7.0*  HCT 26.3* 24.3*  PLT 470* 521*   BMET Recent Labs    05/14/23 0443 05/15/23 0436  NA 138 136  K 4.6 5.0  CL 94* 93*  CO2 37* 39*  GLUCOSE 143* 155*  BUN 11 16  CREATININE 0.75 0.86  CALCIUM 8.5* 8.5*   PT/INR No results for input(s): "LABPROT", "INR" in the last 72 hours. CMP     Component Value Date/Time   NA 136 05/15/2023 0436   NA 142 10/25/2022 1027   K 5.0 05/15/2023 0436   CL 93 (L) 05/15/2023 0436   CO2 39 (H) 05/15/2023 0436   GLUCOSE 155 (H) 05/15/2023 0436   BUN 16 05/15/2023 0436   BUN 14 10/25/2022 1027   CREATININE 0.86 05/15/2023 0436   CREATININE 1.20 07/30/2016 1431   CALCIUM 8.5 (L) 05/15/2023 0436   PROT 6.1 (L) 05/03/2023 1523   PROT 6.7 10/25/2022 1027   ALBUMIN 2.2 (L) 05/04/2023 0550   ALBUMIN 4.4 10/25/2022 1027    AST 36 05/03/2023 1523   ALT 16 05/03/2023 1523   ALKPHOS 100 05/03/2023 1523   BILITOT 0.5 05/03/2023 1523   BILITOT 0.3 10/25/2022 1027   GFRNONAA >60 05/15/2023 0436   GFRNONAA 64 07/30/2016 1431   GFRAA 91 01/29/2020 1020   GFRAA 74 07/30/2016 1431   Lipase     Component Value Date/Time   LIPASE 41 09/20/2021 1620       Studies/Results: IR Sinus/Fist Tube Chk-Non GI  Result Date: 05/14/2023 INDICATION: History appendectomy and postoperative pelvic abscess collection. CT-guided transgluteal drain was placed on 05/05/2023. Recent CT demonstrates small residual pelvic collection and the drain is not optimally positioned. EXAM: 1. Drain injection 2. Drain exchange with fluoroscopy COMPARISON:  None Available. MEDICATIONS: Moderate sedation ANESTHESIA/SEDATION: Moderate (conscious) sedation was employed during this procedure. A total of Versed 1 mg and Fentanyl 50 mcg was administered intravenously by the radiology nurse. Total intra-service moderate Sedation Time: 13 minutes. The patient's level of consciousness and vital signs were monitored continuously by radiology nursing throughout the procedure under my direct supervision. COMPLICATIONS: None immediate. FLUOROSCOPY: Radiation Exposure Index (as provided by the fluoroscopic device): 14 mGy Kerma TECHNIQUE: Informed written consent was obtained from the patient after a thorough discussion of the procedural risks, benefits and alternatives. All questions were addressed. Maximal Sterile Barrier Technique was utilized including caps,  mask, sterile gowns, sterile gloves, sterile drape, hand hygiene and skin antiseptic. A timeout was performed prior to the initiation of the procedure. PROCEDURE: Patient was placed prone. The right transgluteal drain and surrounding skin were prepped and draped in sterile fashion. Drain was injected with contrast. Skin around the drain was anesthetized using 1% lidocaine. The catheter was cut and removed over a  Bentson wire. Kumpe catheter was manipulated into the cephalad portion of the collection. The Kumpe catheter was exchanged for a 10 [place] multipurpose drain. 5 mL of bloody purulent fluid was aspirated. Additional contrast was injected to confirm placement. Drain was flushed with saline and attached to a suction bulb. Drain was sutured to the skin. Fluoroscopic images were taken and saved for this procedure. IMPRESSION: Successful exchange and repositioning of the right transgluteal drain using fluoroscopy. Patient now has a 10 [place] multipurpose drain. Electronically Signed   By: Richarda Overlie M.D.   On: 05/14/2023 17:24   CT ABDOMEN PELVIS W CONTRAST  Result Date: 05/13/2023 CLINICAL DATA:  Follow-up perforated appendicitis. EXAM: CT ABDOMEN AND PELVIS WITH CONTRAST TECHNIQUE: Multidetector CT imaging of the abdomen and pelvis was performed using the standard protocol following bolus administration of intravenous contrast. RADIATION DOSE REDUCTION: This exam was performed according to the departmental dose-optimization program which includes automated exposure control, adjustment of the mA and/or kV according to patient size and/or use of iterative reconstruction technique. CONTRAST:  75mL OMNIPAQUE IOHEXOL 350 MG/ML SOLN COMPARISON:  05/04/2023 FINDINGS: Lower chest: Small right pleural effusion. Hepatobiliary: No focal liver abnormality is seen. No gallstones, gallbladder wall thickening, or biliary dilatation. Pancreas: Unremarkable. No pancreatic ductal dilatation or surrounding inflammatory changes. Spleen: Normal in size without focal abnormality. Adrenals/Urinary Tract: Normal adrenal glands. No nephrolithiasis, hydronephrosis or suspicious mass. Urinary bladder appears normal. Stomach/Bowel: Stomach appears normal. Inflammatory fat stranding identified within the right lower quadrant of the abdomen. The appendix is surgically absent. Several small foci of extraluminal gas identified adjacent to the  cecum, image 68/3 and image 66/3. Increased caliber of the small bowel loops is again noted which appears stable to improved in the interval, likely reflecting ileus. Surgical drainage catheter is again seen entering from of the left lower quadrant approach. The tip of the catheter terminates along the right pericolic gutter adjacent to the inferior margin of the right lobe of liver. The previously noted crescent shaped fluid collection within the posterior pelvis, anterior to the rectum, has decreased in volume from previous exam. On today's study this measures 4.9 x 2.0 by 1.4 cm (volume = 7.2 cm^3), image 77/3 and image 55/6. The percutaneous, pigtail drainage catheter placed on 05/05/2023 is noted along the right inferior margin of this fluid collection, image 77/3. Formally this measured 6.8 x 3.8 by 3.9 cm (volume = 53 cm^3). As before there is a tubular shaped fluid collection extending along the right pelvic sidewall measuring 2.4 x 1.7 by 5.8 cm (volume = 12 cm^3), image 82/7 and image 69/3. On the previous exam this measured 2.8 by 1.6 by 7.2 cm (volume = 17 cm^3). No new fluid collections identified. Vascular/Lymphatic: Aortic atherosclerosis without aneurysm. No abdominopelvic adenopathy. Reproductive: Prostate is unremarkable. Other: Postoperative changes identified along the midline of the ventral abdominal wall. Small punctate foci of gas within the subcutaneous soft tissues noted compatible with postop change. No fluid collection identified within the abdominal wall. Musculoskeletal: Lumbar degenerative disc disease. No acute or suspicious osseous findings. IMPRESSION: 1. Interval decrease in volume of crescent shaped fluid collection  within the posterior pelvis, anterior to the rectum. The percutaneous, pigtail drainage catheter placed on 05/05/2023 is noted along the right inferior margin of this fluid collection. 2. Slight decrease size of tubular shaped fluid collection extending along the right  pelvic sidewall measuring 2.4 x 1.7 by 5.8 cm. 3. Persistent increased caliber of the small bowel loops is again noted which appears stable to improved in the interval, likely reflecting ileus. 4. Small right pleural effusion. 5.  Aortic Atherosclerosis (ICD10-I70.0). Electronically Signed   By: Signa Kell M.D.   On: 05/13/2023 14:02    Anti-infectives: Anti-infectives (From admission, onward)    Start     Dose/Rate Route Frequency Ordered Stop   05/14/23 1800  Ampicillin-Sulbactam (UNASYN) 3 g in sodium chloride 0.9 % 100 mL IVPB        3 g 200 mL/hr over 30 Minutes Intravenous Every 6 hours 05/14/23 0957     05/07/23 0945  piperacillin-tazobactam (ZOSYN) IVPB 3.375 g  Status:  Discontinued        3.375 g 12.5 mL/hr over 240 Minutes Intravenous Every 8 hours 05/07/23 0851 05/14/23 0957   05/07/23 0930  piperacillin-tazobactam (ZOSYN) IVPB 3.375 g  Status:  Discontinued        3.375 g 100 mL/hr over 30 Minutes Intravenous Every 8 hours 05/07/23 0836 05/07/23 0851   04/30/23 1930  piperacillin-tazobactam (ZOSYN) IVPB 3.375 g        3.375 g 12.5 mL/hr over 240 Minutes Intravenous Every 8 hours 04/30/23 1915 05/05/23 1940   04/30/23 1145  piperacillin-tazobactam (ZOSYN) IVPB 3.375 g        3.375 g 100 mL/hr over 30 Minutes Intravenous  Once 04/30/23 1131 04/30/23 1242        Assessment/Plan POD#14 - lap appy for perforated appendicitis 11/20 - MW - s/p IR drain placement 11/24, culture E coli and Klebsiella pneumoniae.   - CT 12/2 with improving fluid collections, IR drain may be slightly retracted. Surgical drain near other fluid collection - S/p IR drain exchange 12/3 - continue IR and surgical drain, antibiotics - encourage PO intake, protein shakes - continue miralax/colace - oob/pulm toilet; encouraged ambulation in halls. Continue therapies - rec SNF - stable for dc to SNF from surgical standpoint   FEN: reg diet, Boost/Ensure ID: zosyn 11/19>>12/3, unasyn 12/3>> VTE:  xarelto   AKI, creatinine improving - nephrology s/o Syncope COPD/Chronic resp failure - on 2 lpm at baseline HTN Atrial flutter - xarelto restarted 11/29 H/o CVA PVD Tobacco use    LOS: 15 days    Franne Forts, [facility] Surgery 05/15/2023, 9:45 AM Please see Amion for pager number during day hours 7:00am-4:30pm

## 2023-05-15 NOTE — Plan of Care (Signed)

## 2023-05-15 NOTE — Progress Notes (Addendum)
PROGRESS NOTE        PATIENT DETAILS Name: Jared [name] Age: 67 y.o. Sex: male Date of Birth: June 03, 1956 Admit Date: 04/30/2023 Admitting Physician Anselm Jungling, DO WUJ:[id number], Odette Horns, MD  Brief Summary: Patient is a 67 y.o.  male with history of COPD on 2 L of oxygen at home, A-fib, HTN, chronic combined systolic/diastolic CHF-who presented with abdominal/testicular pain-found to have sepsis due to acute appendicitis with perforation.  Significant events: 11/19>> admit to [facility] 11/20>> laparoscopic appendectomy 11/24>> transgluteal pelvic drain placement-respiratory distress postprocedure-transferred to ICU. 12/03>> drain injection/manipulation and drain exchange to place catheter in the abscess.  Significant studies: 11/19>> scrotal ultrasound/Doppler ultrasound of testicle: Unremarkable 11/19>> CT head: No acute findings 11/19>> CT abdomen/pelvis: Acute perforated appendicitis. 11/21>> VQ scan: PE absent 11/21>> echo: EF 55-60%, grade 2 diastolic dysfunction, RV systolic function moderately reduced, vague density in the IVC-cannot rule out thrombus 11/21>> IVC/iliac Doppler: Poor study 11/22>> bilateral lower extremity Doppler: No DVT 11/23>> CT abdomen/pelvis: Deep pelvic abscess 12/02>> CT abdomen/pelvis: Decreased volume of pelvic abscess-pigtail drainage catheter is noted along the right inferior margin of this fluid collection.  Significant microbiology data: 11/19>> COVID/influenza/RSV PCR: Negative 11/19>> blood culture: No growth 11/24>> pelvic abscess culture: E. coli/Klebsiella  Procedures: 11/24>> CT-guided transgluteal pelvic drain placement by IR 12/03>> Drain injection, drain manipulation and drain exchange   Consults: Cardiology General Surgery IR  Subjective: Lying comfortably in bed-denies any chest pain or shortness of breath.  Objective: Vitals: Blood pressure (!) 141/68, pulse 88, temperature 97.8 F (36.6 C), resp. rate  15, height 6\' 3"  (1.905 m), weight 110.1 kg, SpO2 100%.   Exam: Gen Exam:Alert awake-not in any distress HEENT:atraumatic, normocephalic Chest: B/L clear to auscultation anteriorly CVS:S1S2 regular Abdomen:soft non tender, non distended Extremities:no edema Neurology: Non focal Skin: no rash  Pertinent Labs/Radiology:    Latest Ref Rng & Units 05/15/2023    4:36 AM 05/14/2023    4:43 AM 05/13/2023    4:59 AM  CBC  WBC 4.0 - 10.5 K/uL 10.2  12.2  13.6   Hemoglobin 13.0 - 17.0 g/dL 7.0  7.4  8.2   Hematocrit 39.0 - 52.0 % 24.3  26.3  28.8   Platelets 150 - 400 K/uL 521  470  494     Lab Results  Component Value Date   NA 136 05/15/2023   K 5.0 05/15/2023   CL 93 (L) 05/15/2023   CO2 39 (H) 05/15/2023      Assessment/Plan: Sepsis due to acute appendicitis with perforation s/p laparoscopic appendectomy 11/20-with development of residual deep pelvic abscess requiring drain placement by IR on 11/24 and drain exchange on 12/3 Stable Continue to monitor drain output Continue empiric antibiotics Will discuss with general surgery-regarding duration of antimicrobial therapy.  Acute on chronic hypoxic respiratory failure Respiratory decompensation after lying prone for drain placement on 11/24-briefly required NRB-and close ICU monitoring. Currently back on usual home regimen of 2-3 L.  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  Atrial flutter  Rate  controlled Beta-blocker/Xarelto  HTN Stable Metoprolol/Imdur/hydralazine  HLD Statin  PAD Aspirin/statin  History of recent syncope Chest pain No chest pain this morning Cardiology planning outpatient monitor Telemetry monitoring in the interim.  Mood disorder Cymbalta/Neurontin  Tobacco abuse Transdermal nicotine  Debility/deconditioning PT/OT eval-SNF recommended  Nutrition Status: Nutrition Problem: Moderate Malnutrition Etiology: acute illness Signs/Symptoms: energy intake < 75% for > 7 days, mild muscle depletion, mild fat depletion Interventions: Refer to RD note for recommendations, MVI, Ensure Enlive (each supplement provides 350kcal and 20 grams of protein), Boost Breeze  Obesity: Estimated body mass index is 30.34 kg/m as calculated from the following:   Height as of this encounter: 6\' 3"  (1.905 m).   Weight as of this encounter: 110.1 kg.   Code status:   Code Status: Full Code   DVT Prophylaxis: SCDs Start: 04/30/23 1909 rivaroxaban (XARELTO) tablet 20 mg     Family Communication: None at bedside   Disposition Plan: Status is: Inpatient Remains inpatient appropriate because: Severity of illness   Planned Discharge Destination:Skilled nursing facility   Diet: Diet Order             Diet regular Room service appropriate? Yes; Fluid consistency: Thin  Diet effective now                     Antimicrobial agents: Anti-infectives (From admission, onward)    Start     Dose/Rate Route Frequency Ordered Stop   05/14/23 1800  Ampicillin-Sulbactam (UNASYN) 3 g in sodium chloride 0.9 % 100 mL IVPB        3 g 200 mL/hr over 30 Minutes Intravenous Every 6 hours 05/14/23 0957     05/07/23 0945  piperacillin-tazobactam (ZOSYN) IVPB 3.375 g  Status:  Discontinued        3.375 g 12.5 mL/hr over 240 Minutes Intravenous Every 8 hours 05/07/23 0851 05/14/23 0957   05/07/23 0930  piperacillin-tazobactam (ZOSYN) IVPB 3.375 g  Status:   Discontinued        3.375 g 100 mL/hr over 30 Minutes Intravenous Every 8 hours 05/07/23 0836 05/07/23 0851   04/30/23 1930  piperacillin-tazobactam (ZOSYN) IVPB 3.375 g        3.375 g 12.5 mL/hr over 240 Minutes Intravenous Every 8 hours 04/30/23 1915 05/05/23 1940   04/30/23 1145  piperacillin-tazobactam (ZOSYN) IVPB 3.375 g        3.375 g 100 mL/hr over 30 Minutes Intravenous  Once 04/30/23 1131 04/30/23 1242        MEDICATIONS: Scheduled Meds:  acetaminophen  1,000 mg Oral Q6H   arformoterol  15 mcg Nebulization BID   aspirin EC  81 mg Oral Daily   atorvastatin  80 mg Oral Daily   bisacodyl  10 mg Rectal Once   budesonide (PULMICORT) nebulizer solution  0.5 mg Nebulization BID   Chlorhexidine Gluconate Cloth  6 each Topical Daily   docusate sodium  100 mg Oral BID   DULoxetine  60 mg Oral Daily   ezetimibe  10 mg Oral Daily   feeding supplement  1 Container Oral TID WC   feeding supplement  237 mL Oral BID BM   ferrous sulfate  325 mg Oral Q breakfast   folic acid  1 mg Oral Daily   furosemide  40 mg Oral Daily   gabapentin  300 mg Oral BID   hydrALAZINE  50 mg Oral Q8H   isosorbide mononitrate  30 mg Oral Daily   metoprolol succinate  50  mg Oral Daily   multivitamin with minerals  1 tablet Oral Daily   nicotine  21 mg Transdermal Daily   pantoprazole  40 mg Oral Daily   polyethylene glycol  17 g Oral BID   revefenacin  175 mcg Nebulization Daily   rivaroxaban  20 mg Oral Daily   sodium chloride flush  5 mL Intracatheter Q8H   Continuous Infusions:  ampicillin-sulbactam (UNASYN) IV 3 g (05/15/23 1203)   PRN Meds:.albuterol, bisacodyl, haloperidol lactate, morphine injection, oxyCODONE   I have personally reviewed following labs and imaging studies  LABORATORY DATA: CBC: Recent Labs  Lab 05/11/23 0347 05/12/23 0625 05/13/23 0459 05/14/23 0443 05/15/23 0436  WBC 12.2* 12.8* 13.6* 12.2* 10.2  HGB 8.9* 8.6* 8.2* 7.4* 7.0*  HCT 31.1* 30.6* 28.8* 26.3*  24.3*  MCV 92.6 92.4 92.6 92.0 93.1  PLT 514* 460* 494* 470* 521*    Basic Metabolic Panel: Recent Labs  Lab 05/09/23 0337 05/10/23 0355 05/11/23 0347 05/12/23 0625 05/13/23 0459 05/14/23 0443 05/15/23 0436  NA 137   < > 139 141 141 138 136  K 3.3*   < > 3.7 4.6 4.7 4.6 5.0  CL 92*   < > 96* 95* 93* 94* 93*  CO2 38*   < > 38* 37* 40* 37* 39*  GLUCOSE 155*   < > 145* 117* 106* 143* 155*  BUN 8   < > 8 10 12 11 16   CREATININE 1.01   < > 0.82 0.94 1.02 0.75 0.86  CALCIUM 8.2*   < > 8.2* 8.8* 9.0 8.5* 8.5*  MG  --   --  1.7  --   --   --   --   PHOS 2.2*  --   --   --   --  3.5 3.0   < > = values in this interval not displayed.    GFR: Estimated Creatinine Clearance: 108.6 mL/min (by C-G formula based on SCr of 0.86 mg/dL).  Liver Function Tests: No results for input(s): "AST", "ALT", "ALKPHOS", "BILITOT", "PROT", "ALBUMIN" in the last 168 hours. No results for input(s): "LIPASE", "AMYLASE" in the last 168 hours. No results for input(s): "AMMONIA" in the last 168 hours.  Coagulation Profile: No results for input(s): "INR", "PROTIME" in the last 168 hours.  Cardiac Enzymes: No results for input(s): "CKTOTAL", "CKMB", "CKMBINDEX", "TROPONINI" in the last 168 hours.  BNP (last 3 results) No results for input(s): "PROBNP" in the last 8760 hours.  Lipid Profile: No results for input(s): "CHOL", "HDL", "LDLCALC", "TRIG", "CHOLHDL", "LDLDIRECT" in the last 72 hours.  Thyroid Function Tests: No results for input(s): "TSH", "T4TOTAL", "FREET4", "T3FREE", "THYROIDAB" in the last 72 hours.  Anemia Panel: No results for input(s): "VITAMINB12", "FOLATE", "FERRITIN", "TIBC", "IRON", "RETICCTPCT" in the last 72 hours.  Urine analysis:    Component Value Date/Time   COLORURINE YELLOW 04/30/2023 2155   APPEARANCEUR CLEAR 04/30/2023 2155   LABSPEC 1.028 04/30/2023 2155   PHURINE 5.0 04/30/2023 2155   GLUCOSEU NEGATIVE 04/30/2023 2155   HGBUR SMALL (A) 04/30/2023 2155    BILIRUBINUR NEGATIVE 04/30/2023 2155   BILIRUBINUR negative 09/20/2021 1620   KETONESUR NEGATIVE 04/30/2023 2155   PROTEINUR NEGATIVE 04/30/2023 2155   UROBILINOGEN 0.2 09/20/2021 1620   NITRITE NEGATIVE 04/30/2023 2155   LEUKOCYTESUR NEGATIVE 04/30/2023 2155    Sepsis Labs: Lactic Acid, Venous    Component Value Date/Time   LATICACIDVEN 1.1 05/03/2023 0657    MICROBIOLOGY: Recent Results (from the past 240 hour(s))  Aerobic/Anaerobic Culture  w Gram Stain (surgical/deep wound)     Status: None   Collection Time: 05/05/23  3:23 PM   Specimen: Abscess  Result Value Ref Range Status   Specimen Description ABSCESS  Final   Special Requests pelvis  Final   Gram Stain   Final    ABUNDANT WBC PRESENT, PREDOMINANTLY MONONUCLEAR FEW GRAM POSITIVE COCCI IN PAIRS MODERATE GRAM NEGATIVE RODS FEW GRAM POSITIVE RODS    Culture   Final    ABUNDANT ESCHERICHIA COLI MODERATE KLEBSIELLA PNEUMONIAE MODERATE BACTEROIDES FRAGILIS MODERATE BACTEROIDES CACCAE BETA LACTAMASE POSITIVE Performed at [facility], [address] [place], [place] [address]    Report Status 05/09/2023 FINAL  Final   Organism ID, Bacteria ESCHERICHIA COLI  Final   Organism ID, Bacteria KLEBSIELLA PNEUMONIAE  Final      Susceptibility   Escherichia coli - MIC*    AMPICILLIN >=32 RESISTANT Resistant     CEFEPIME <=0.12 SENSITIVE Sensitive     CEFTAZIDIME <=1 SENSITIVE Sensitive     CEFTRIAXONE <=0.25 SENSITIVE Sensitive     CIPROFLOXACIN <=0.25 SENSITIVE Sensitive     GENTAMICIN <=1 SENSITIVE Sensitive     IMIPENEM <=0.25 SENSITIVE Sensitive     TRIMETH/SULFA <=20 SENSITIVE Sensitive     AMPICILLIN/SULBACTAM <=2 SENSITIVE Sensitive     PIP/TAZO <=4 SENSITIVE Sensitive ug/mL    * ABUNDANT ESCHERICHIA COLI   Klebsiella pneumoniae - MIC*    AMPICILLIN RESISTANT Resistant     CEFEPIME <=0.12 SENSITIVE Sensitive     CEFTAZIDIME <=1 SENSITIVE Sensitive     CEFTRIAXONE <=0.25 SENSITIVE Sensitive      CIPROFLOXACIN <=0.25 SENSITIVE Sensitive     GENTAMICIN <=1 SENSITIVE Sensitive     IMIPENEM <=0.25 SENSITIVE Sensitive     TRIMETH/SULFA <=20 SENSITIVE Sensitive     AMPICILLIN/SULBACTAM 4 SENSITIVE Sensitive     PIP/TAZO <=4 SENSITIVE Sensitive ug/mL    * MODERATE KLEBSIELLA PNEUMONIAE  MRSA Next Gen by PCR, Nasal     Status: None   Collection Time: 05/05/23  9:20 PM   Specimen: Nasal Mucosa; Nasal Swab  Result Value Ref Range Status   MRSA by PCR Next Gen NOT DETECTED NOT DETECTED Final    Comment: (NOTE) The GeneXpert MRSA Assay (FDA approved for NASAL specimens only), is one component of a comprehensive MRSA colonization surveillance program. It is not intended to diagnose MRSA infection nor to guide or monitor treatment for MRSA infections. Test performance is not FDA approved in patients less than 56 years old. Performed at [facility], [address], [place], [place] [address]     RADIOLOGY STUDIES/RESULTS: IR Sinus/Fist Tube Chk-Non GI  Result Date: 05/14/2023 INDICATION: History appendectomy and postoperative pelvic abscess collection. CT-guided transgluteal drain was placed on 05/05/2023. Recent CT demonstrates small residual pelvic collection and the drain is not optimally positioned. EXAM: 1. Drain injection 2. Drain exchange with fluoroscopy COMPARISON:  None Available. MEDICATIONS: Moderate sedation ANESTHESIA/SEDATION: Moderate (conscious) sedation was employed during this procedure. A total of Versed 1 mg and Fentanyl 50 mcg was administered intravenously by the radiology nurse. Total intra-service moderate Sedation Time: 13 minutes. The patient's level of consciousness and vital signs were monitored continuously by radiology nursing throughout the procedure under my direct supervision. COMPLICATIONS: None immediate. FLUOROSCOPY: Radiation Exposure Index (as provided by the fluoroscopic device): 14 mGy Kerma TECHNIQUE: Informed written consent was obtained from the  patient after a thorough discussion of the procedural risks, benefits and alternatives. All questions were addressed. Maximal Sterile  Barrier Technique was utilized including caps, mask, sterile gowns, sterile gloves, sterile drape, hand hygiene and skin antiseptic. A timeout was performed prior to the initiation of the procedure. PROCEDURE: Patient was placed prone. The right transgluteal drain and surrounding skin were prepped and draped in sterile fashion. Drain was injected with contrast. Skin around the drain was anesthetized using 1% lidocaine. The catheter was cut and removed over a Bentson wire. Kumpe catheter was manipulated into the cephalad portion of the collection. The Kumpe catheter was exchanged for a 10 [place] multipurpose drain. 5 mL of bloody purulent fluid was aspirated. Additional contrast was injected to confirm placement. Drain was flushed with saline and attached to a suction bulb. Drain was sutured to the skin. Fluoroscopic images were taken and saved for this procedure. IMPRESSION: Successful exchange and repositioning of the right transgluteal drain using fluoroscopy. Patient now has a 10 [place] multipurpose drain. Electronically Signed   By: Richarda Overlie M.D.   On: 05/14/2023 17:24     LOS: 15 days   Jeoffrey Massed, MD  [facility]    To contact the attending provider between 7A-7P or the covering provider during after hours 7P-7A, please log into the web site [email or web address] and access using universal [place] password for that web site. If you do not have the password, please call the hospital operator.  05/15/2023, 2:08 PM

## 2023-05-15 NOTE — TOC Progression Note (Signed)
Transition of Care [facility]) - Progression Note    Patient Details  Name: Jared [name] MRN: [id number] Date of Birth: December 01, 1955  [facility]) CM/SW Contact  Mearl Latin, LCSW Phone Number: 05/15/2023, 4:20 PM  Clinical Narrative:    CSW spoke with patient's sister regarding SNF choice. She stated she was not able to tour [facility] but will go with them anyway. [facility] able to accept patient pending insurance approval. CSW initiated insurance authorization process, Ref# [id number].    Expected Discharge Plan: Skilled Nursing Facility Barriers to Discharge: Continued Medical Work up, English as a second language teacher  Expected Discharge Plan and Services In-house Referral: Clinical Social Work Discharge Planning Services: [employer] Consult Post Acute Care Choice: Skilled Nursing Facility Living arrangements for the past 2 months: Single Family Home                 DME Arranged: N/A         HH Arranged: NA           Social Determinants of Health (SDOH) Interventions SDOH Screenings   Food Insecurity: No Food Insecurity (04/30/2023)  Housing: Low Risk  (04/30/2023)  Transportation Needs: No Transportation Needs (04/30/2023)  Utilities: Not At Risk (04/30/2023)  Alcohol Screen: Low Risk  (02/06/2023)  Depression (PHQ2-9): Low Risk  (02/04/2023)  Recent Concern: Depression (PHQ2-9) - High Risk (01/09/2023)  Financial Resource Strain: Medium Risk (02/06/2023)  Physical Activity: Insufficiently Active (02/06/2023)  Social Connections: Moderately Integrated (02/06/2023)  Stress: No Stress Concern Present (02/06/2023)  Tobacco Use: High Risk (05/05/2023)  Health Literacy: Adequate Health Literacy (02/06/2023)    Readmission Risk Interventions    12/27/2022    2:20 PM 12/14/2021    1:38 PM 02/01/2021    3:22 PM  Readmission Risk Prevention Plan  Post Dischage Appt Complete Complete   Medication Screening Complete Complete   Transportation Screening Complete Complete Complete  PCP  or Specialist Appt within 5-7 Days   Complete  Home Care Screening   Complete  Medication Review (RN CM)   Complete

## 2023-05-15 NOTE — Progress Notes (Signed)
Mobility Specialist Progress Note;   05/15/23 1135  Mobility  Activity Ambulated with assistance in hallway  Level of Assistance Contact guard assist, steadying assist  Assistive Device Front wheel walker  Distance Ambulated (ft) 345 ft  Activity Response Tolerated well  Mobility Referral Yes  $Mobility charge 1 Mobility  Mobility Specialist Start Time (ACUTE ONLY) 1135  Mobility Specialist Stop Time (ACUTE ONLY) 1150  Mobility Specialist Time Calculation (min) (ACUTE ONLY) 15 min   Pt agreeable to mobility. Required MinG assistance during ambulation for safety. Ambulated on 3LO2 to stay [facility]. Displayed SOB once back on EOB, recovered with rest. No c/o throughout. Pt left on EOB with all needs met.   Caesar Bookman Mobility Specialist Please contact via SecureChat or Rehab Office [phone]

## 2023-05-15 NOTE — Progress Notes (Signed)
PT Cancellation Note  Patient Details Name: Jared [name] MRN: [id number] DOB: [birth year]   Cancelled Treatment:    Reason Eval/Treat Not Completed: (P) Fatigue/lethargy limiting ability to participate (Pt reports earlier walk with mobility and is now fatigued. Will continue to follow per POC.)   [name] 05/15/2023, 1:53 PM

## 2023-05-15 NOTE — Progress Notes (Signed)
Rounding Note    Patient Name: Jared [name] Date of Encounter: 05/15/2023  [facility] Cardiologist: Dietrich Pates, MD   Subjective   Taking some PO drain in place still   Inpatient Medications    Scheduled Meds:  acetaminophen  1,000 mg Oral Q6H   arformoterol  15 mcg Nebulization BID   aspirin EC  81 mg Oral Daily   atorvastatin  80 mg Oral Daily   bisacodyl  10 mg Rectal Once   budesonide (PULMICORT) nebulizer solution  0.5 mg Nebulization BID   Chlorhexidine Gluconate Cloth  6 each Topical Daily   docusate sodium  100 mg Oral BID   DULoxetine  60 mg Oral Daily   ezetimibe  10 mg Oral Daily   feeding supplement  1 Container Oral TID WC   feeding supplement  237 mL Oral BID BM   ferrous sulfate  325 mg Oral Q breakfast   folic acid  1 mg Oral Daily   furosemide  40 mg Oral Daily   gabapentin  300 mg Oral BID   hydrALAZINE  50 mg Oral Q8H   isosorbide mononitrate  30 mg Oral Daily   metoprolol succinate  50 mg Oral Daily   multivitamin with minerals  1 tablet Oral Daily   nicotine  21 mg Transdermal Daily   pantoprazole  40 mg Oral Daily   polyethylene glycol  17 g Oral BID   revefenacin  175 mcg Nebulization Daily   rivaroxaban  20 mg Oral Daily   sodium chloride flush  5 mL Intracatheter Q8H   Continuous Infusions:  ampicillin-sulbactam (UNASYN) IV 3 g (05/15/23 0634)   PRN Meds: albuterol, bisacodyl, haloperidol lactate, morphine injection, oxyCODONE   Vital Signs    Vitals:   05/15/23 0000 05/15/23 0300 05/15/23 0410 05/15/23 0830  BP: 133/81  135/75 115/86  Pulse: 87  88 85  Resp: 17  (!) 22 19  Temp: 98.2 F (36.8 C)  97.7 F (36.5 C) (!) 97.5 F (36.4 C)  TempSrc: Oral  Oral Oral  SpO2: 99%  100% 100%  Weight:  110.1 kg    Height:        Intake/Output Summary (Last 24 hours) at 05/15/2023 0959 Last data filed at 05/15/2023 1610 Gross per 24 hour  Intake 20 ml  Output 1200 ml  Net -1180 ml      05/15/2023    3:00 AM 05/14/2023     4:00 AM 05/12/2023    5:00 AM  Last 3 Weights  Weight (lbs) 242 lb 11.6 oz 231 lb 7.7 oz 215 lb 6.2 oz  Weight (kg) 110.1 kg 105 kg 97.7 kg      Telemetry    SR, short runs SVT - Personally Reviewed  ECG    N/a - Personally Reviewed  Physical Exam   Black male no distress Lungs exp wheezing COPD  No murmur  Large dressing in abdomen with JP drain in place Plus one bilateral edema  Labs    High Sensitivity Troponin:   Recent Labs  Lab 04/30/23 1144 04/30/23 1317 05/02/23 1717 05/02/23 1718  TROPONINIHS 48* 36* 37* 40*     Chemistry Recent Labs  Lab 05/11/23 0347 05/12/23 0625 05/13/23 0459 05/14/23 0443 05/15/23 0436  NA 139   < > 141 138 136  K 3.7   < > 4.7 4.6 5.0  CL 96*   < > 93* 94* 93*  CO2 38*   < > 40* 37* 39*  GLUCOSE 145*   < > 106* 143* 155*  BUN 8   < > 12 11 16   CREATININE 0.82   < > 1.02 0.75 0.86  CALCIUM 8.2*   < > 9.0 8.5* 8.5*  MG 1.7  --   --   --   --   GFRNONAA >60   < > >60 >60 >60  ANIONGAP 5   < > 8 7 4*   < > = values in this interval not displayed.    Lipids No results for input(s): "CHOL", "TRIG", "HDL", "LABVLDL", "LDLCALC", "CHOLHDL" in the last 168 hours.  Hematology Recent Labs  Lab 05/13/23 0459 05/14/23 0443 05/15/23 0436  WBC 13.6* 12.2* 10.2  RBC 3.11* 2.86* 2.61*  HGB 8.2* 7.4* 7.0*  HCT 28.8* 26.3* 24.3*  MCV 92.6 92.0 93.1  MCH 26.4 25.9* 26.8  MCHC 28.5* 28.1* 28.8*  RDW 17.6* 17.6* 17.4*  PLT 494* 470* 521*   Thyroid No results for input(s): "TSH", "FREET4" in the last 168 hours.  BNP Recent Labs  Lab 05/12/23 0625  BNP 96.5    DDimer No results for input(s): "DDIMER" in the last 168 hours.   Radiology    IR Sinus/Fist Tube Chk-Non GI  Result Date: 05/14/2023 INDICATION: History appendectomy and postoperative pelvic abscess collection. CT-guided transgluteal drain was placed on 05/05/2023. Recent CT demonstrates small residual pelvic collection and the drain is not optimally positioned. EXAM:  1. Drain injection 2. Drain exchange with fluoroscopy COMPARISON:  None Available. MEDICATIONS: Moderate sedation ANESTHESIA/SEDATION: Moderate (conscious) sedation was employed during this procedure. A total of Versed 1 mg and Fentanyl 50 mcg was administered intravenously by the radiology nurse. Total intra-service moderate Sedation Time: 13 minutes. The patient's level of consciousness and vital signs were monitored continuously by radiology nursing throughout the procedure under my direct supervision. COMPLICATIONS: None immediate. FLUOROSCOPY: Radiation Exposure Index (as provided by the fluoroscopic device): 14 mGy Kerma TECHNIQUE: Informed written consent was obtained from the patient after a thorough discussion of the procedural risks, benefits and alternatives. All questions were addressed. Maximal Sterile Barrier Technique was utilized including caps, mask, sterile gowns, sterile gloves, sterile drape, hand hygiene and skin antiseptic. A timeout was performed prior to the initiation of the procedure. PROCEDURE: Patient was placed prone. The right transgluteal drain and surrounding skin were prepped and draped in sterile fashion. Drain was injected with contrast. Skin around the drain was anesthetized using 1% lidocaine. The catheter was cut and removed over a Bentson wire. Kumpe catheter was manipulated into the cephalad portion of the collection. The Kumpe catheter was exchanged for a 10 [place] multipurpose drain. 5 mL of bloody purulent fluid was aspirated. Additional contrast was injected to confirm placement. Drain was flushed with saline and attached to a suction bulb. Drain was sutured to the skin. Fluoroscopic images were taken and saved for this procedure. IMPRESSION: Successful exchange and repositioning of the right transgluteal drain using fluoroscopy. Patient now has a 10 [place] multipurpose drain. Electronically Signed   By: Richarda Overlie M.D.   On: 05/14/2023 17:24    Cardiac Studies     Patient Profile      Assessment & Plan    1.PAF/aflutter with RVR - he is on toprol 50mg  daily for rate control - Back on xarelto 20mg  daily.    2. HFimpEF/RV dysfunction - - Echo 11/21 showed LVEF improved to 55-60% , no RWMA, grade II DD, RV mod reduced, RV mod enlarged, mild LAE. TR inadequate to measure  PASP - history of severe COPD and restrictive lung disease, likely group III pulm HTN.  - VQ negative this admission for PE. Consider outpatient sleep study - I/O negative 1 Liter    BNP normalized good diuresis Nees nebs for COPD wrote for oral lasix 40 mg daily    3. Chest pain -Cath in 2012 -Ischemia on Myoview 2016 - some chest pain this admission in setting of wheezing, volume overload.    4. Syncope - Patient reports 2 years of intermittent generalized body shaking with syncope and loss of consciousness that are often positional when going from sitting to standing and therefore sound orthostatic  - 21 beat run of NSVT on 11/23  - consider outpatient monitor if ongoing episodes at f/u     5. Perforated appendicitis/sepsis - per primary team - Went to IR for transgluteal pelvic drain placement, drain management by IR anemic and WBC still elevated On Zosyn for antibiotics  - 05/15/23 had catheter exchange for 10 [place] Multipurpose   6. PAD - has been on ASA, statin  7. Anemia:  Hb down to 7 this am consider transfusion   For questions or updates, please contact [facility] Please consult [email or web address] for contact info under        Signed, Charlton Haws, MD  05/15/2023, 9:59 AM

## 2023-05-16 DIAGNOSIS — K3532 Acute appendicitis with perforation and localized peritonitis, without abscess: Secondary | ICD-10-CM | POA: Diagnosis not present

## 2023-05-16 DIAGNOSIS — I480 Paroxysmal atrial fibrillation: Secondary | ICD-10-CM | POA: Diagnosis not present

## 2023-05-16 LAB — CBC
HCT: 23.7 % — ABNORMAL LOW (ref 39.0–52.0)
Hemoglobin: 6.7 g/dL — CL (ref 13.0–17.0)
MCH: 26.2 pg (ref 26.0–34.0)
MCHC: 28.3 g/dL — ABNORMAL LOW (ref 30.0–36.0)
MCV: 92.6 fL (ref 80.0–100.0)
Platelets: 549 10*3/uL — ABNORMAL HIGH (ref 150–400)
RBC: 2.56 MIL/uL — ABNORMAL LOW (ref 4.22–5.81)
RDW: 17.2 % — ABNORMAL HIGH (ref 11.5–15.5)
WBC: 8.8 10*3/uL (ref 4.0–10.5)
nRBC: 0.2 % (ref 0.0–0.2)

## 2023-05-16 LAB — HEMOGLOBIN AND HEMATOCRIT, BLOOD
HCT: 25.3 % — ABNORMAL LOW (ref 39.0–52.0)
Hemoglobin: 7.5 g/dL — ABNORMAL LOW (ref 13.0–17.0)

## 2023-05-16 LAB — COMPREHENSIVE METABOLIC PANEL
ALT: 55 U/L — ABNORMAL HIGH (ref 0–44)
AST: 49 U/L — ABNORMAL HIGH (ref 15–41)
Albumin: 2.4 g/dL — ABNORMAL LOW (ref 3.5–5.0)
Alkaline Phosphatase: 144 U/L — ABNORMAL HIGH (ref 38–126)
Anion gap: 6 (ref 5–15)
BUN: 15 mg/dL (ref 8–23)
CO2: 40 mmol/L — ABNORMAL HIGH (ref 22–32)
Calcium: 8.7 mg/dL — ABNORMAL LOW (ref 8.9–10.3)
Chloride: 90 mmol/L — ABNORMAL LOW (ref 98–111)
Creatinine, Ser: 0.91 mg/dL (ref 0.61–1.24)
GFR, Estimated: >60 mL/min (ref >60)
Glucose, Bld: 137 mg/dL — ABNORMAL HIGH (ref 70–99)
Potassium: 4.9 mmol/L (ref 3.5–5.1)
Sodium: 136 mmol/L (ref 135–145)
Total Bilirubin: 0.3 mg/dL (ref <1.2)
Total Protein: 6 g/dL — ABNORMAL LOW (ref 6.5–8.1)

## 2023-05-16 LAB — GLUCOSE, CAPILLARY
Glucose-Capillary: 148 mg/dL — ABNORMAL HIGH (ref 70–99)
Glucose-Capillary: 116 mg/dL — ABNORMAL HIGH (ref 70–99)
Glucose-Capillary: 179 mg/dL — ABNORMAL HIGH (ref 70–99)
Glucose-Capillary: 131 mg/dL — ABNORMAL HIGH (ref 70–99)

## 2023-05-16 LAB — PREPARE RBC (CROSSMATCH)

## 2023-05-16 MED ORDER — METHOCARBAMOL 500 MG PO TABS: 500.0000 mg | ORAL_TABLET | Freq: Four times a day (QID) | ORAL | Status: DC | PRN

## 2023-05-16 MED ORDER — AMOXICILLIN-POT CLAVULANATE 875-125 MG PO TABS
1.0000 | ORAL_TABLET | Freq: Two times a day (BID) | ORAL | Status: DC
  Administered 2023-05-16 – 2023-05-20 (×9): 1 via ORAL
  Filled 2023-05-16 (×10): qty 1

## 2023-05-16 MED ORDER — ACETAMINOPHEN 325 MG PO TABS
650.0000 mg | ORAL_TABLET | Freq: Four times a day (QID) | ORAL | Status: DC
  Administered 2023-05-16 – 2023-05-21 (×15): 650 mg via ORAL
  Filled 2023-05-16 (×16): qty 2

## 2023-05-16 MED ORDER — SODIUM CHLORIDE 0.9% IV SOLUTION: Freq: Once | INTRAVENOUS | Status: AC

## 2023-05-16 NOTE — TOC Progression Note (Addendum)
Transition of Care [facility]) - Progression Note    Patient Details  Name: Jared [name] MRN: [id number] Date of Birth: 05-19-1956  Transition of Care [facility]) CM/SW Contact  Mearl Latin, LCSW Phone Number: 05/16/2023, 9:14 AM  Clinical Narrative:    9:14 AM-Insurance approval for [facility] SNF still pending.   3:19 PM-Insurance approval still pending.    Expected Discharge Plan: Skilled Nursing Facility Barriers to Discharge: Continued Medical Work up, English as a second language teacher  Expected Discharge Plan and Services In-house Referral: Clinical Social Work Discharge Planning Services: [employer] Consult Post Acute Care Choice: Skilled Nursing Facility Living arrangements for the past 2 months: Single Family Home                 DME Arranged: N/A         HH Arranged: NA           Social Determinants of Health (SDOH) Interventions SDOH Screenings   Food Insecurity: No Food Insecurity (04/30/2023)  Housing: Low Risk  (04/30/2023)  Transportation Needs: No Transportation Needs (04/30/2023)  Utilities: Not At Risk (04/30/2023)  Alcohol Screen: Low Risk  (02/06/2023)  Depression (PHQ2-9): Low Risk  (02/04/2023)  Recent Concern: Depression (PHQ2-9) - High Risk (01/09/2023)  Financial Resource Strain: Medium Risk (02/06/2023)  Physical Activity: Insufficiently Active (02/06/2023)  Social Connections: Moderately Integrated (02/06/2023)  Stress: No Stress Concern Present (02/06/2023)  Tobacco Use: High Risk (05/05/2023)  Health Literacy: Adequate Health Literacy (02/06/2023)    Readmission Risk Interventions    12/27/2022    2:20 PM 12/14/2021    1:38 PM 02/01/2021    3:22 PM  Readmission Risk Prevention Plan  Post Dischage Appt Complete Complete   Medication Screening Complete Complete   Transportation Screening Complete Complete Complete  PCP or Specialist Appt within 5-7 Days   Complete  Home Care Screening   Complete  Medication Review (RN CM)   Complete

## 2023-05-16 NOTE — Progress Notes (Signed)
Nutrition Follow-up  DOCUMENTATION CODES:   Non-severe (moderate) malnutrition in context of acute illness/injury  INTERVENTION:  - Ensure Enlive po BID between meals, each supplement provides 350 kcal and 20 grams of protein   - Continue MVI with minerals daily - d/c boost breeze due to good oral intake   NUTRITION DIAGNOSIS:   Moderate Malnutrition related to acute illness as evidenced by energy intake < 75% for > 7 days, mild muscle depletion, mild fat depletion. Resolving with interventions in place.     GOAL:   Patient will meet greater than or equal to 90% of their needs    MONITOR:   PO intake, Supplement acceptance, Labs, Weight trends, I & O's  REASON FOR ASSESSMENT:   Consult Assessment of nutrition requirement/status  ASSESSMENT:   67 y.o. male with PMH of CHF, T2DM, HTN, CVA, atrial flutter, CAD, AVM, PVD and COPD with chronic hypoxic respiratory failure on 2L who presents with numerous complaints including headache, abdominal, testicular pain. Found to have acute perforated appendicitis.  Patient reports good appetite. He feels as though he is getting plenty to eat.  He had no  questions or concerns or questions at this time.   Review of EMR revealed 75-100% of most meals.  Suspect boos supplement is not necessary at this time.   NUTRITION - FOCUSED PHYSICAL EXAM:  Flowsheet Row Most Recent Value  Orbital Region Mild depletion  Upper Arm Region Mild depletion  Thoracic and Lumbar Region Unable to assess  Buccal Region Mild depletion  Temple Region Severe depletion  Clavicle Bone Region Mild depletion  Clavicle and Acromion Bone Region Mild depletion  Scapular Bone Region Mild depletion  Dorsal Hand Mild depletion  Patellar Region Moderate depletion  Anterior Thigh Region Moderate depletion  Posterior Calf Region Unable to assess  Edema (RD Assessment) None  Hair Reviewed  Eyes Reviewed  Mouth Reviewed  Skin Reviewed  Nails Reviewed        Diet Order:   Diet Order             Diet regular Room service appropriate? Yes; Fluid consistency: Thin  Diet effective now                   EDUCATION NEEDS:   Education needs have been addressed  Skin:  Skin Assessment: Reviewed RN Assessment Skin Integrity Issues:: Incisions, Other (Comment) Incisions: closed abd Other: puncture wound R buttocks for drain placement  Last BM:  11/27  Height:   Ht Readings from Last 1 Encounters:  05/01/23 6\' 3"  (1.905 m)    Weight:   Wt Readings from Last 1 Encounters:  05/15/23 110.1 kg    Ideal Body Weight:  89.1 kg  BMI:  Body mass index is 30.34 kg/m.  Estimated Nutritional Needs:   Kcal:  2400-2600 kcal  Protein:  130-150 gm  Fluid:  >2.4L    Jamelle Haring RDN, LDN Clinical Dietitian  RDN pager # available on Amion

## 2023-05-16 NOTE — Plan of Care (Signed)
  Problem: Education: Goal: Knowledge of General Education information will improve Description: Including pain rating scale, medication(s)/side effects and non-pharmacologic comfort measures Outcome: Progressing   Problem: Clinical Measurements: Goal: Ability to maintain clinical measurements within normal limits will improve Outcome: Progressing Goal: Diagnostic test results will improve Outcome: Progressing Goal: Respiratory complications will improve Outcome: Progressing   Problem: Nutrition: Goal: Adequate nutrition will be maintained Outcome: Progressing   Problem: Coping: Goal: Level of anxiety will decrease Outcome: Progressing   Problem: Pain Management: Goal: General experience of comfort will improve Outcome: Progressing

## 2023-05-16 NOTE — Progress Notes (Signed)
PROGRESS NOTE        PATIENT DETAILS Name: Jared [name] Age: 67 y.o. Sex: male Date of Birth: 06/01/1956 Admit Date: 04/30/2023 Admitting Physician Anselm Jungling, DO ZOX:[id number], Odette Horns, MD  Brief Summary: Patient is a 66 y.o.  male with history of COPD on 2 L of oxygen at home, A-fib, HTN, chronic combined systolic/diastolic CHF-who presented with abdominal/testicular pain-found to have sepsis due to acute appendicitis with perforation.  Significant events: 11/19>> admit to [facility] 11/20>> laparoscopic appendectomy 11/24>> transgluteal pelvic drain placement-respiratory distress postprocedure-transferred to ICU. 12/03>> drain injection/manipulation and drain exchange to place catheter in the abscess.  Significant studies: 11/19>> scrotal ultrasound/Doppler ultrasound of testicle: Unremarkable 11/19>> CT head: No acute findings 11/19>> CT abdomen/pelvis: Acute perforated appendicitis. 11/21>> VQ scan: PE absent 11/21>> echo: EF 55-60%, grade 2 diastolic dysfunction, RV systolic function moderately reduced, vague density in the IVC-cannot rule out thrombus 11/21>> IVC/iliac Doppler: Poor study 11/22>> bilateral lower extremity Doppler: No DVT 11/23>> CT abdomen/pelvis: Deep pelvic abscess 12/02>> CT abdomen/pelvis: Decreased volume of pelvic abscess-pigtail drainage catheter is noted along the right inferior margin of this fluid collection.  Significant microbiology data: 11/19>> COVID/influenza/RSV PCR: Negative 11/19>> blood culture: No growth 11/24>> pelvic abscess culture: E. coli/Klebsiella  Procedures: 11/24>> CT-guided transgluteal pelvic drain placement by IR 12/03>> Drain injection, drain manipulation and drain exchange   Consults: Cardiology General Surgery IR  Subjective: No complaints-had greenish colored stools this morning.  Denies melena/hematochezia/hematemesis  Objective: Vitals: Blood pressure 120/63, pulse 82, temperature 98 F  (36.7 C), temperature source Oral, resp. rate 16, height 6\' 3"  (1.905 m), weight 110.1 kg, SpO2 100%.   Exam: Gen Exam:Alert awake-not in any distress HEENT:atraumatic, normocephalic Chest: B/L clear to auscultation anteriorly CVS:S1S2 regular Abdomen:soft non tender, non distended Extremities:no edema Neurology: Non focal Skin: no rash  Pertinent Labs/Radiology:    Latest Ref Rng & Units 05/16/2023    5:33 AM 05/15/2023    4:36 AM 05/14/2023    4:43 AM  CBC  WBC 4.0 - 10.5 K/uL 8.8  10.2  12.2   Hemoglobin 13.0 - 17.0 g/dL 6.7  7.0  7.4   Hematocrit 39.0 - 52.0 % 23.7  24.3  26.3   Platelets 150 - 400 K/uL 549  521  470     Lab Results  Component Value Date   NA 136 05/16/2023   K 4.9 05/16/2023   CL 90 (L) 05/16/2023   CO2 40 (H) 05/16/2023      Assessment/Plan: Sepsis due to acute appendicitis with perforation s/p laparoscopic appendectomy 11/20-with development of residual deep pelvic abscess requiring drain placement by IR on 11/24 and drain exchange on 12/3 Stable Continue to monitor drain output Discussed with general surgery team on 12/4-okay to switch to oral antibiotic-and to continue antibiotics for the next 2 weeks or so until patient gets a repeat CT abdomen.  Will switch to Augmentin today.  Acute on chronic hypoxic respiratory failure Respiratory decompensation after lying prone for drain placement on 11/24-briefly required NRB-and close ICU monitoring. Currently back on usual home regimen of 2-3 L.  Normocytic anemia Hb slowly trending down No evidence of blood loss Suspect worsening anemia due to acute/critical illness Transfuse 1 unit of PRBC Cautiously continue with Eliquis but suspect does not require aspirin at this point.  Stop aspirin given severity of anemia.  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  Atrial flutter Rate controlled Beta-blocker/Xarelto  HTN Stable Metoprolol/Imdur/hydralazine  HLD Statin  PAD Eliquis/statin Holding aspirin due to severity of anemia.  History of recent syncope Chest pain No chest pain this morning Cardiology planning outpatient monitor Telemetry monitoring in the interim.  Mood disorder Cymbalta/Neurontin  Tobacco abuse Transdermal nicotine  Debility/deconditioning PT/OT eval-SNF recommended  Nutrition Status: Nutrition Problem: Moderate Malnutrition Etiology: acute illness Signs/Symptoms: energy intake < 75% for > 7 days, mild muscle depletion, mild fat depletion Interventions: Refer to RD note for recommendations, MVI, Ensure Enlive (each supplement provides 350kcal and 20 grams of protein), Boost Breeze  Obesity: Estimated body mass index is 30.34 kg/m as calculated from the following:   Height as of this encounter: 6\' 3"  (1.905 m).   Weight as of this encounter: 110.1 kg.   Code status:   Code Status: Full Code   DVT Prophylaxis: SCDs Start: 04/30/23 1909     Family Communication: None at bedside   Disposition Plan: Status is: Inpatient Remains inpatient appropriate because: Severity of illness   Planned Discharge Destination:Skilled nursing facility   Diet: Diet Order             Diet regular Room service appropriate? Yes; Fluid consistency: Thin  Diet effective now                     Antimicrobial agents: Anti-infectives (From admission, onward)    Start     Dose/Rate Route Frequency Ordered Stop   05/16/23 1200  amoxicillin-clavulanate (AUGMENTIN) 875-125 MG per tablet 1 tablet        1 tablet Oral Every 12 hours 05/16/23 1109     05/14/23 1800  Ampicillin-Sulbactam  (UNASYN) 3 g in sodium chloride 0.9 % 100 mL IVPB  Status:  Discontinued        3 g 200 mL/hr over 30 Minutes Intravenous Every 6 hours 05/14/23 0957 05/16/23 1109   05/07/23 0945  piperacillin-tazobactam (ZOSYN) IVPB 3.375 g  Status:  Discontinued        3.375 g 12.5 mL/hr over 240 Minutes Intravenous Every 8 hours 05/07/23 0851 05/14/23 0957   05/07/23 0930  piperacillin-tazobactam (ZOSYN) IVPB 3.375 g  Status:  Discontinued        3.375 g 100 mL/hr over 30 Minutes Intravenous Every 8 hours 05/07/23 0836 05/07/23 0851   04/30/23 1930  piperacillin-tazobactam (ZOSYN) IVPB 3.375 g        3.375 g 12.5 mL/hr over 240 Minutes Intravenous Every 8 hours 04/30/23 1915 05/05/23 1940   04/30/23 1145  piperacillin-tazobactam (ZOSYN) IVPB 3.375 g        3.375 g 100 mL/hr over 30 Minutes Intravenous  Once 04/30/23 1131 04/30/23 1242        MEDICATIONS: Scheduled Meds:  sodium chloride   Intravenous Once   acetaminophen  650 mg Oral Q6H   amoxicillin-clavulanate  1 tablet Oral Q12H   arformoterol  15 mcg Nebulization BID   aspirin EC  81 mg Oral Daily   atorvastatin  80 mg Oral Daily   bisacodyl  10 mg Rectal Once   budesonide (PULMICORT) nebulizer solution  0.5 mg Nebulization BID   Chlorhexidine Gluconate Cloth  6 each Topical Daily  docusate sodium  100 mg Oral BID   DULoxetine  60 mg Oral Daily   ezetimibe  10 mg Oral Daily   feeding supplement  1 Container Oral TID WC   feeding supplement  237 mL Oral BID BM   ferrous sulfate  325 mg Oral Q breakfast   folic acid  1 mg Oral Daily   furosemide  40 mg Oral Daily   gabapentin  300 mg Oral BID   hydrALAZINE  50 mg Oral Q8H   isosorbide mononitrate  30 mg Oral Daily   metoprolol succinate  50 mg Oral Daily   multivitamin with minerals  1 tablet Oral Daily   nicotine  21 mg Transdermal Daily   pantoprazole  40 mg Oral Daily   polyethylene glycol  17 g Oral BID   revefenacin  175 mcg Nebulization Daily   sodium chloride flush  5 mL  Intracatheter Q8H   Continuous Infusions:   PRN Meds:.albuterol, bisacodyl, haloperidol lactate, methocarbamol, morphine injection, oxyCODONE   I have personally reviewed following labs and imaging studies  LABORATORY DATA: CBC: Recent Labs  Lab 05/12/23 0625 05/13/23 0459 05/14/23 0443 05/15/23 0436 05/16/23 0533  WBC 12.8* 13.6* 12.2* 10.2 8.8  HGB 8.6* 8.2* 7.4* 7.0* 6.7*  HCT 30.6* 28.8* 26.3* 24.3* 23.7*  MCV 92.4 92.6 92.0 93.1 92.6  PLT 460* 494* 470* 521* 549*    Basic Metabolic Panel: Recent Labs  Lab 05/11/23 0347 05/12/23 0625 05/13/23 0459 05/14/23 0443 05/15/23 0436 05/16/23 0533  NA 139 141 141 138 136 136  K 3.7 4.6 4.7 4.6 5.0 4.9  CL 96* 95* 93* 94* 93* 90*  CO2 38* 37* 40* 37* 39* 40*  GLUCOSE 145* 117* 106* 143* 155* 137*  BUN 8 10 12 11 16 15   CREATININE 0.82 0.94 1.02 0.75 0.86 0.91  CALCIUM 8.2* 8.8* 9.0 8.5* 8.5* 8.7*  MG 1.7  --   --   --   --   --   PHOS  --   --   --  3.5 3.0  --     GFR: Estimated Creatinine Clearance: 102.6 mL/min (by C-G formula based on SCr of 0.91 mg/dL).  Liver Function Tests: Recent Labs  Lab 05/16/23 0533  AST 49*  ALT 55*  ALKPHOS 144*  BILITOT 0.3  PROT 6.0*  ALBUMIN 2.4*   No results for input(s): "LIPASE", "AMYLASE" in the last 168 hours. No results for input(s): "AMMONIA" in the last 168 hours.  Coagulation Profile: No results for input(s): "INR", "PROTIME" in the last 168 hours.  Cardiac Enzymes: No results for input(s): "CKTOTAL", "CKMB", "CKMBINDEX", "TROPONINI" in the last 168 hours.  BNP (last 3 results) No results for input(s): "PROBNP" in the last 8760 hours.  Lipid Profile: No results for input(s): "CHOL", "HDL", "LDLCALC", "TRIG", "CHOLHDL", "LDLDIRECT" in the last 72 hours.  Thyroid Function Tests: No results for input(s): "TSH", "T4TOTAL", "FREET4", "T3FREE", "THYROIDAB" in the last 72 hours.  Anemia Panel: No results for input(s): "VITAMINB12", "FOLATE", "FERRITIN",  "TIBC", "IRON", "RETICCTPCT" in the last 72 hours.  Urine analysis:    Component Value Date/Time   COLORURINE YELLOW 04/30/2023 2155   APPEARANCEUR CLEAR 04/30/2023 2155   LABSPEC 1.028 04/30/2023 2155   PHURINE 5.0 04/30/2023 2155   GLUCOSEU NEGATIVE 04/30/2023 2155   HGBUR SMALL (A) 04/30/2023 2155   BILIRUBINUR NEGATIVE 04/30/2023 2155   BILIRUBINUR negative 09/20/2021 1620   KETONESUR NEGATIVE 04/30/2023 2155   PROTEINUR NEGATIVE 04/30/2023 2155   UROBILINOGEN 0.2  09/20/2021 1620   NITRITE NEGATIVE 04/30/2023 2155   LEUKOCYTESUR NEGATIVE 04/30/2023 2155    Sepsis Labs: Lactic Acid, Venous    Component Value Date/Time   LATICACIDVEN 1.1 05/03/2023 0657    MICROBIOLOGY: No results found for this or any previous visit (from the past 240 hour(s)).   RADIOLOGY STUDIES/RESULTS: IR Catheter Tube Change  Result Date: 05/14/2023 INDICATION: History appendectomy and postoperative pelvic abscess collection. CT-guided transgluteal drain was placed on 05/05/2023. Recent CT demonstrates small residual pelvic collection and the drain is not optimally positioned. EXAM: 1. Drain injection 2. Drain exchange with fluoroscopy COMPARISON:  None Available. MEDICATIONS: Moderate sedation ANESTHESIA/SEDATION: Moderate (conscious) sedation was employed during this procedure. A total of Versed 1 mg and Fentanyl 50 mcg was administered intravenously by the radiology nurse. Total intra-service moderate Sedation Time: 13 minutes. The patient's level of consciousness and vital signs were monitored continuously by radiology nursing throughout the procedure under my direct supervision. COMPLICATIONS: None immediate. FLUOROSCOPY: Radiation Exposure Index (as provided by the fluoroscopic device): 14 mGy Kerma TECHNIQUE: Informed written consent was obtained from the patient after a thorough discussion of the procedural risks, benefits and alternatives. All questions were addressed. Maximal Sterile Barrier  Technique was utilized including caps, mask, sterile gowns, sterile gloves, sterile drape, hand hygiene and skin antiseptic. A timeout was performed prior to the initiation of the procedure. PROCEDURE: Patient was placed prone. The right transgluteal drain and surrounding skin were prepped and draped in sterile fashion. Drain was injected with contrast. Skin around the drain was anesthetized using 1% lidocaine. The catheter was cut and removed over a Bentson wire. Kumpe catheter was manipulated into the cephalad portion of the collection. The Kumpe catheter was exchanged for a 10 [place] multipurpose drain. 5 mL of bloody purulent fluid was aspirated. Additional contrast was injected to confirm placement. Drain was flushed with saline and attached to a suction bulb. Drain was sutured to the skin. Fluoroscopic images were taken and saved for this procedure. IMPRESSION: Successful exchange and repositioning of the right transgluteal drain using fluoroscopy. Patient now has a 10 [place] multipurpose drain. Electronically Signed   By: Richarda Overlie M.D.   On: 05/14/2023 17:24     LOS: 16 days   Jeoffrey Massed, MD  [facility]    To contact the attending provider between 7A-7P or the covering provider during after hours 7P-7A, please log into the web site [email or web address] and access using universal [place] password for that web site. If you do not have the password, please call the hospital operator.  05/16/2023, 11:38 AM

## 2023-05-16 NOTE — Progress Notes (Signed)
PT Cancellation Note  Patient Details Name: Jared [name] Age MRN: [id number] DOB: Oct 31, 1955   Cancelled Treatment:    Reason Eval/Treat Not Completed: (P) Patient declined, no reason specified (Pt about to receive blood and requests to remain in bed. Will continue to follow per PT POC.)   [name] 05/16/2023, 4:04 PM

## 2023-05-16 NOTE — Progress Notes (Signed)
Patient ID: Jared [name], male   DOB: [birth year], 67 y.o.   MRN: [id number] [facility] [place] Surgery Progress Note  15 Days Post-Op  Subjective: CC-  Main complaint is pain from transgluteal drain. Otherwise denies abdominal pain, nausea, vomiting. Tolerating PO and drank 3-4 protein shakes yesterday. Passing flatus, BM this morning. Hgb 6.7 from 7, getting 1u PRBCs. VSS.  Objective: Vital signs in last 24 hours: Temp:  [97.8 F (36.6 C)-98 F (36.7 C)] 98 F (36.7 C) (12/05 0832) Pulse Rate:  [82-93] 82 (12/05 0832) Resp:  [14-20] 16 (12/05 0832) BP: (119-141)/(63-70) 120/63 (12/05 0832) SpO2:  [100 %] 100 % (12/05 0832) Last BM Date : 05/15/23  Intake/Output from previous day: 12/04 0701 - 12/05 0700 In: 705 [IV Piggyback:700] Out: 1465 [Urine:1450; Drains:15] Intake/Output this shift: No intake/output data recorded.  PE: Gen:  Alert, NAD Abd: soft, mild distension, bowel sounds present, wound at umbilicus open and clean without drainage, nontender. Left sided surgical drain scant serosanguinous fluid in bulb, IR transgluteal drain scant bloody fluid in bulb   Lab Results:  Recent Labs    05/15/23 0436 05/16/23 0533  WBC 10.2 8.8  HGB 7.0* 6.7*  HCT 24.3* 23.7*  PLT 521* 549*   BMET Recent Labs    05/15/23 0436 05/16/23 0533  NA 136 136  K 5.0 4.9  CL 93* 90*  CO2 39* 40*  GLUCOSE 155* 137*  BUN 16 15  CREATININE 0.86 0.91  CALCIUM 8.5* 8.7*   PT/INR No results for input(s): "LABPROT", "INR" in the last 72 hours. CMP     Component Value Date/Time   NA 136 05/16/2023 0533   NA 142 10/25/2022 1027   K 4.9 05/16/2023 0533   CL 90 (L) 05/16/2023 0533   CO2 40 (H) 05/16/2023 0533   GLUCOSE 137 (H) 05/16/2023 0533   BUN 15 05/16/2023 0533   BUN 14 10/25/2022 1027   CREATININE 0.91 05/16/2023 0533   CREATININE 1.20 07/30/2016 1431   CALCIUM 8.7 (L) 05/16/2023 0533   PROT 6.0 (L) 05/16/2023 0533   PROT 6.7 10/25/2022 1027   ALBUMIN 2.4 (L) 05/16/2023  0533   ALBUMIN 4.4 10/25/2022 1027   AST 49 (H) 05/16/2023 0533   ALT 55 (H) 05/16/2023 0533   ALKPHOS 144 (H) 05/16/2023 0533   BILITOT 0.3 05/16/2023 0533   BILITOT 0.3 10/25/2022 1027   GFRNONAA >60 05/16/2023 0533   GFRNONAA 64 07/30/2016 1431   GFRAA 91 01/29/2020 1020   GFRAA 74 07/30/2016 1431   Lipase     Component Value Date/Time   LIPASE 41 09/20/2021 1620       Studies/Results: IR Catheter Tube Change  Result Date: 05/14/2023 INDICATION: History appendectomy and postoperative pelvic abscess collection. CT-guided transgluteal drain was placed on 05/05/2023. Recent CT demonstrates small residual pelvic collection and the drain is not optimally positioned. EXAM: 1. Drain injection 2. Drain exchange with fluoroscopy COMPARISON:  None Available. MEDICATIONS: Moderate sedation ANESTHESIA/SEDATION: Moderate (conscious) sedation was employed during this procedure. A total of Versed 1 mg and Fentanyl 50 mcg was administered intravenously by the radiology nurse. Total intra-service moderate Sedation Time: 13 minutes. The patient's level of consciousness and vital signs were monitored continuously by radiology nursing throughout the procedure under my direct supervision. COMPLICATIONS: None immediate. FLUOROSCOPY: Radiation Exposure Index (as provided by the fluoroscopic device): 14 mGy Kerma TECHNIQUE: Informed written consent was obtained from the patient after a thorough discussion of the procedural risks, benefits and alternatives. All questions were addressed.  Maximal Sterile Barrier Technique was utilized including caps, mask, sterile gowns, sterile gloves, sterile drape, hand hygiene and skin antiseptic. A timeout was performed prior to the initiation of the procedure. PROCEDURE: Patient was placed prone. The right transgluteal drain and surrounding skin were prepped and draped in sterile fashion. Drain was injected with contrast. Skin around the drain was anesthetized using 1%  lidocaine. The catheter was cut and removed over a Bentson wire. Kumpe catheter was manipulated into the cephalad portion of the collection. The Kumpe catheter was exchanged for a 10 [place] multipurpose drain. 5 mL of bloody purulent fluid was aspirated. Additional contrast was injected to confirm placement. Drain was flushed with saline and attached to a suction bulb. Drain was sutured to the skin. Fluoroscopic images were taken and saved for this procedure. IMPRESSION: Successful exchange and repositioning of the right transgluteal drain using fluoroscopy. Patient now has a 10 [place] multipurpose drain. Electronically Signed   By: Richarda Overlie M.D.   On: 05/14/2023 17:24    Anti-infectives: Anti-infectives (From admission, onward)    Start     Dose/Rate Route Frequency Ordered Stop   05/14/23 1800  Ampicillin-Sulbactam (UNASYN) 3 g in sodium chloride 0.9 % 100 mL IVPB        3 g 200 mL/hr over 30 Minutes Intravenous Every 6 hours 05/14/23 0957     05/07/23 0945  piperacillin-tazobactam (ZOSYN) IVPB 3.375 g  Status:  Discontinued        3.375 g 12.5 mL/hr over 240 Minutes Intravenous Every 8 hours 05/07/23 0851 05/14/23 0957   05/07/23 0930  piperacillin-tazobactam (ZOSYN) IVPB 3.375 g  Status:  Discontinued        3.375 g 100 mL/hr over 30 Minutes Intravenous Every 8 hours 05/07/23 0836 05/07/23 0851   04/30/23 1930  piperacillin-tazobactam (ZOSYN) IVPB 3.375 g        3.375 g 12.5 mL/hr over 240 Minutes Intravenous Every 8 hours 04/30/23 1915 05/05/23 1940   04/30/23 1145  piperacillin-tazobactam (ZOSYN) IVPB 3.375 g        3.375 g 100 mL/hr over 30 Minutes Intravenous  Once 04/30/23 1131 04/30/23 1242        Assessment/Plan POD#15 - lap appy for perforated appendicitis 11/20 - MW - s/p IR drain placement 11/24, culture E coli and Klebsiella pneumoniae.   - CT 12/2 with improving fluid collections, IR drain may be slightly retracted. Surgical drain near other fluid collection - S/p IR  drain exchange 12/3 - continue IR and surgical drain, antibiotics. Plan to continue antibiotics until repeat imaging given persistent fluid collections on last CT. Currently on unasyn but could be transitioned to oral abx when medically ready for discharge  - encourage PO intake, protein shakes - continue miralax/colace - oob/pulm toilet; encouraged ambulation in halls. Continue therapies - rec SNF - getting 1u PRBCs for Hgb 6.7, h/h has been slowly drifting down. VSS. Occult blood pending   FEN: reg diet, Boost/Ensure ID: zosyn 11/19>>12/3, unasyn 12/3>> VTE: xarelto   AKI, creatinine improving - nephrology s/o Syncope COPD/Chronic resp failure - on 2 lpm at baseline HTN Atrial flutter - xarelto restarted 11/29 H/o CVA PVD Tobacco use    LOS: 16 days    Franne Forts, [facility] 05/16/2023, 8:53 AM Please see Amion for pager number during day hours 7:00am-4:30pm

## 2023-05-16 NOTE — Progress Notes (Addendum)
Acute on chronic anemia: Patient's nurse reported that patient's hemoglobin has been dropped to 6.7 this morning.  Per chart review patient's baseline hemoglobin around 8.5-8.9 which has been progressively dropping over the course of last 5 days.  Patient does not have any active bleeding source. Chart review patient initially admitted 11/19 after laparoscopic appendectomy.  Patient has transgluteal pelvic drain which is 10 mL drainage without any active bleeding.Unknown source of bleeding at this time.  Patient is hemodynamically stable. - Preparing 2 units of blood and transfusing 1 unit of blood this morning 6:40 AM. -Continue to trend H&H and transfuse as needed to keep hemoglobin above 8. -Checking FOBT. -Patient is on Xarelto due to history of atrial flutter.  EKG did not showed any evidence of atrial flutter since the admission.  Holding Xarelto for today as Hb persistently trending down 6.7 now, last dose of Xarelto 8:30 AM 05/15/2023, need to discuss with cardiology in the daytime for risk and benefit of further continuation of Xarelto.    Tereasa Coop, MD [facility] 05/16/2023, 6:37 AM

## 2023-05-17 DIAGNOSIS — N179 Acute kidney failure, unspecified: Secondary | ICD-10-CM | POA: Diagnosis not present

## 2023-05-17 DIAGNOSIS — K3532 Acute appendicitis with perforation and localized peritonitis, without abscess: Secondary | ICD-10-CM | POA: Diagnosis not present

## 2023-05-17 DIAGNOSIS — I10 Essential (primary) hypertension: Secondary | ICD-10-CM | POA: Diagnosis not present

## 2023-05-17 DIAGNOSIS — R55 Syncope and collapse: Secondary | ICD-10-CM | POA: Diagnosis not present

## 2023-05-17 LAB — CBC
HCT: 25.8 % — ABNORMAL LOW (ref 39.0–52.0)
Hemoglobin: 7.4 g/dL — ABNORMAL LOW (ref 13.0–17.0)
MCH: 25.8 pg — ABNORMAL LOW (ref 26.0–34.0)
MCHC: 28.7 g/dL — ABNORMAL LOW (ref 30.0–36.0)
MCV: 89.9 fL (ref 80.0–100.0)
Platelets: 571 10*3/uL — ABNORMAL HIGH (ref 150–400)
RBC: 2.87 MIL/uL — ABNORMAL LOW (ref 4.22–5.81)
RDW: 18.2 % — ABNORMAL HIGH (ref 11.5–15.5)
WBC: 9.6 10*3/uL (ref 4.0–10.5)
nRBC: 0.4 % — ABNORMAL HIGH (ref 0.0–0.2)

## 2023-05-17 LAB — COMPREHENSIVE METABOLIC PANEL
ALT: 74 U/L — ABNORMAL HIGH (ref 0–44)
AST: 69 U/L — ABNORMAL HIGH (ref 15–41)
Albumin: 2.5 g/dL — ABNORMAL LOW (ref 3.5–5.0)
Alkaline Phosphatase: 151 U/L — ABNORMAL HIGH (ref 38–126)
Anion gap: 6 (ref 5–15)
BUN: 16 mg/dL (ref 8–23)
CO2: 38 mmol/L — ABNORMAL HIGH (ref 22–32)
Calcium: 8.7 mg/dL — ABNORMAL LOW (ref 8.9–10.3)
Chloride: 94 mmol/L — ABNORMAL LOW (ref 98–111)
Creatinine, Ser: 0.81 mg/dL (ref 0.61–1.24)
GFR, Estimated: >60 mL/min (ref >60)
Glucose, Bld: 128 mg/dL — ABNORMAL HIGH (ref 70–99)
Potassium: 4.3 mmol/L (ref 3.5–5.1)
Sodium: 138 mmol/L (ref 135–145)
Total Bilirubin: 0.4 mg/dL (ref <1.2)
Total Protein: 6.1 g/dL — ABNORMAL LOW (ref 6.5–8.1)

## 2023-05-17 LAB — GLUCOSE, CAPILLARY
Glucose-Capillary: 158 mg/dL — ABNORMAL HIGH (ref 70–99)
Glucose-Capillary: 143 mg/dL — ABNORMAL HIGH (ref 70–99)
Glucose-Capillary: 140 mg/dL — ABNORMAL HIGH (ref 70–99)
Glucose-Capillary: 132 mg/dL — ABNORMAL HIGH (ref 70–99)

## 2023-05-17 LAB — OCCULT BLOOD X 1 CARD TO LAB, STOOL: Fecal Occult Bld: POSITIVE — AB

## 2023-05-17 NOTE — Progress Notes (Signed)
PROGRESS NOTE        PATIENT DETAILS Name: Jared [name] Age: 67 y.o. Sex: male Date of Birth: 1956/01/19 Admit Date: 04/30/2023 Admitting Physician Anselm Jungling, DO ZOX:[id number], Odette Horns, MD  Brief Summary: Patient is a 67 y.o.  male with history of COPD on 2 L of oxygen at home, A-fib, HTN, chronic combined systolic/diastolic CHF-who presented with abdominal/testicular pain-found to have sepsis due to acute appendicitis with perforation.  Significant events: 11/19>> admit to [facility] 11/20>> laparoscopic appendectomy 11/24>> transgluteal pelvic drain placement-respiratory distress postprocedure-transferred to ICU. 12/03>> drain injection/manipulation and drain exchange to place catheter in the abscess.  Significant studies: 11/19>> scrotal ultrasound/Doppler ultrasound of testicle: Unremarkable 11/19>> CT head: No acute findings 11/19>> CT abdomen/pelvis: Acute perforated appendicitis. 11/21>> VQ scan: PE absent 11/21>> echo: EF 55-60%, grade 2 diastolic dysfunction, RV systolic function moderately reduced, vague density in the IVC-cannot rule out thrombus 11/21>> IVC/iliac Doppler: Poor study 11/22>> bilateral lower extremity Doppler: No DVT 11/23>> CT abdomen/pelvis: Deep pelvic abscess 12/02>> CT abdomen/pelvis: Decreased volume of pelvic abscess-pigtail drainage catheter is noted along the right inferior margin of this fluid collection.  Significant microbiology data: 11/19>> COVID/influenza/RSV PCR: Negative 11/19>> blood culture: No growth 11/24>> pelvic abscess culture: E. coli/Klebsiella  Procedures: 11/24>> CT-guided transgluteal pelvic drain placement by IR 12/03>> Drain injection, drain manipulation and drain exchange   Consults: Cardiology General Surgery IR  Subjective: Lying comfortably in bed-no major issues-claims his stools are dark but still somewhat green in color.  Objective: Vitals: Blood pressure (!) 130/90, pulse 97,  temperature 98 F (36.7 C), temperature source Oral, resp. rate 20, height 6\' 3"  (1.905 m), weight 110.1 kg, SpO2 100%.   Exam: Gen Exam:Alert awake-not in any distress HEENT:atraumatic, normocephalic Chest: B/L clear to auscultation anteriorly CVS:S1S2 regular Abdomen:soft non tender, non distended Extremities:no edema Neurology: Non focal Skin: no rash  Pertinent Labs/Radiology:    Latest Ref Rng & Units 05/17/2023    3:43 AM 05/16/2023    7:42 PM 05/16/2023    5:33 AM  CBC  WBC 4.0 - 10.5 K/uL 9.6   8.8   Hemoglobin 13.0 - 17.0 g/dL 7.4  7.5  6.7   Hematocrit 39.0 - 52.0 % 25.8  25.3  23.7   Platelets 150 - 400 K/uL 571   549     Lab Results  Component Value Date   NA 138 05/17/2023   K 4.3 05/17/2023   CL 94 (L) 05/17/2023   CO2 38 (H) 05/17/2023      Assessment/Plan: Sepsis due to acute appendicitis with perforation s/p laparoscopic appendectomy 11/20-with development of residual deep pelvic abscess requiring drain placement by IR on 11/24 and drain exchange on 12/3 Stable Continue to monitor drain output Discussed with general surgery team on 12/4-okay to switch to Augmentin-and to continue  for the next 2 weeks or so until patient gets a repeat CT abdomen.    Acute on chronic hypoxic respiratory failure Respiratory decompensation after lying prone for drain placement on 11/24-briefly required NRB-and close ICU monitoring. Currently back on usual home regimen of 2-3 L.  Normocytic anemia No hematochezia but stools have been mostly dark green per patient-given downtrending Hb even after 1 unit of PRBC on 12/5-plan is to hold all anticoagulation/aspirin and monitor closely.  He may have slow volume GI bleeding. Have asked patient to let RN know when he  has a bowel movement-so that we can observe-check FOBT.   Repeat CBC tomorrow.  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  Atrial flutter Rate controlled Beta-blocker Xarelto on hold due to severity of anemia and concern for slow GI bleeding.  HTN Stable Metoprolol/Imdur/hydralazine  HLD Statin  PAD Eliquis/statin Holding aspirin due to severity of anemia.  History of recent syncope Chest pain No chest pain this morning Cardiology planning outpatient monitor Telemetry monitoring in the interim.  Mood disorder Cymbalta/Neurontin  Tobacco abuse Transdermal nicotine  Debility/deconditioning PT/OT eval-SNF recommended  Nutrition Status: Nutrition Problem: Moderate Malnutrition Etiology: acute illness Signs/Symptoms: energy intake < 75% for > 7 days, mild muscle depletion, mild fat depletion Interventions: Refer to RD note for recommendations, MVI, Ensure Enlive (each supplement provides 350kcal and 20 grams of protein), Boost Breeze  Obesity: Estimated body mass index is 30.34 kg/m as calculated from the following:   Height as of this encounter: 6\' 3"  (1.905 m).   Weight as of this encounter: 110.1 kg.   Code status:   Code Status: Full Code   DVT Prophylaxis: SCDs Start: 04/30/23 1909     Family Communication: None at bedside   Disposition Plan: Status is: Inpatient Remains inpatient appropriate because: Severity of illness   Planned Discharge Destination:Skilled nursing facility   Diet: Diet Order             Diet regular Room service appropriate? Yes; Fluid consistency: Thin  Diet effective now                     Antimicrobial agents: Anti-infectives (From admission, onward)    Start     Dose/Rate Route Frequency Ordered Stop   05/16/23 1200  amoxicillin-clavulanate  (AUGMENTIN) 875-125 MG per tablet 1 tablet        1 tablet Oral Every 12 hours 05/16/23 1109     05/14/23 1800  Ampicillin-Sulbactam (UNASYN) 3 g in sodium chloride 0.9 % 100 mL IVPB  Status:  Discontinued        3 g 200 mL/hr over 30 Minutes Intravenous Every 6 hours 05/14/23 0957 05/16/23 1109   05/07/23 0945  piperacillin-tazobactam (ZOSYN) IVPB 3.375 g  Status:  Discontinued        3.375 g 12.5 mL/hr over 240 Minutes Intravenous Every 8 hours 05/07/23 0851 05/14/23 0957   05/07/23 0930  piperacillin-tazobactam (ZOSYN) IVPB 3.375 g  Status:  Discontinued        3.375 g 100 mL/hr over 30 Minutes Intravenous Every 8 hours 05/07/23 0836 05/07/23 0851   04/30/23 1930  piperacillin-tazobactam (ZOSYN) IVPB 3.375 g        3.375 g 12.5 mL/hr over 240 Minutes Intravenous Every 8 hours 04/30/23 1915 05/05/23 1940   04/30/23 1145  piperacillin-tazobactam (ZOSYN) IVPB 3.375 g        3.375 g 100 mL/hr over 30 Minutes Intravenous  Once 04/30/23 1131 04/30/23 1242        MEDICATIONS: Scheduled Meds:  acetaminophen  650 mg Oral Q6H   amoxicillin-clavulanate  1 tablet Oral Q12H   arformoterol  15 mcg Nebulization BID   atorvastatin  80 mg Oral Daily   bisacodyl  10 mg Rectal Once   budesonide (PULMICORT) nebulizer solution  0.5 mg Nebulization BID   Chlorhexidine Gluconate Cloth  6 each Topical Daily   docusate sodium  100 mg Oral BID   DULoxetine  60 mg Oral Daily   ezetimibe  10 mg Oral Daily   feeding supplement  237 mL Oral BID BM   ferrous sulfate  325 mg Oral Q breakfast   folic acid  1 mg Oral Daily   furosemide  40 mg Oral Daily   gabapentin  300 mg Oral BID   hydrALAZINE  50 mg Oral Q8H   isosorbide mononitrate  30 mg Oral Daily   metoprolol succinate  50 mg Oral Daily   multivitamin with minerals  1 tablet Oral Daily   nicotine  21 mg Transdermal Daily   pantoprazole  40 mg Oral Daily   polyethylene glycol  17 g Oral BID   revefenacin  175 mcg Nebulization Daily   sodium  chloride flush  5 mL Intracatheter Q8H   Continuous Infusions:   PRN Meds:.albuterol, bisacodyl, haloperidol lactate, methocarbamol, morphine injection, oxyCODONE   I have personally reviewed following labs and imaging studies  LABORATORY DATA: CBC: Recent Labs  Lab 05/13/23 0459 05/14/23 0443 05/15/23 0436 05/16/23 0533 05/16/23 1942 05/17/23 0343  WBC 13.6* 12.2* 10.2 8.8  --  9.6  HGB 8.2* 7.4* 7.0* 6.7* 7.5* 7.4*  HCT 28.8* 26.3* 24.3* 23.7* 25.3* 25.8*  MCV 92.6 92.0 93.1 92.6  --  89.9  PLT 494* 470* 521* 549*  --  571*    Basic Metabolic Panel: Recent Labs  Lab 05/11/23 0347 05/12/23 0625 05/13/23 0459 05/14/23 0443 05/15/23 0436 05/16/23 0533 05/17/23 0343  NA 139   < > 141 138 136 136 138  K 3.7   < > 4.7 4.6 5.0 4.9 4.3  CL 96*   < > 93* 94* 93* 90* 94*  CO2 38*   < > 40* 37* 39* 40* 38*  GLUCOSE 145*   < > 106* 143* 155* 137* 128*  BUN 8   < > 12 11 16 15 16   CREATININE 0.82   < > 1.02 0.75 0.86 0.91 0.81  CALCIUM 8.2*   < > 9.0 8.5* 8.5* 8.7* 8.7*  MG 1.7  --   --   --   --   --   --   PHOS  --   --   --  3.5 3.0  --   --    < > = values in this interval not displayed.    GFR: Estimated Creatinine Clearance: 115.3 mL/min (by C-G formula based on SCr of 0.81 mg/dL).  Liver Function Tests: Recent Labs  Lab 05/16/23 0533 05/17/23 0343  AST 49* 69*  ALT 55* 74*  ALKPHOS 144* 151*  BILITOT 0.3 0.4  PROT 6.0* 6.1*  ALBUMIN 2.4* 2.5*   No results for input(s): "LIPASE", "AMYLASE" in the last 168 hours. No results for input(s): "AMMONIA" in the last 168 hours.  Coagulation Profile: No results for input(s): "INR", "PROTIME" in the last 168 hours.  Cardiac Enzymes: No results for input(s): "CKTOTAL", "CKMB", "CKMBINDEX", "TROPONINI" in the last 168 hours.  BNP (last 3 results) No results for input(s): "PROBNP" in the last 8760 hours.  Lipid Profile: No results for input(s): "CHOL", "HDL", "LDLCALC", "TRIG", "CHOLHDL", "LDLDIRECT" in the  last 72 hours.  Thyroid Function Tests: No results for input(s): "TSH", "T4TOTAL", "FREET4", "T3FREE", "THYROIDAB" in the last 72 hours.  Anemia Panel: No results for input(s): "VITAMINB12", "FOLATE", "FERRITIN", "TIBC", "IRON", "RETICCTPCT" in  the last 72 hours.  Urine analysis:    Component Value Date/Time   COLORURINE YELLOW 04/30/2023 2155   APPEARANCEUR CLEAR 04/30/2023 2155   LABSPEC 1.028 04/30/2023 2155   PHURINE 5.0 04/30/2023 2155   GLUCOSEU NEGATIVE 04/30/2023 2155   HGBUR SMALL (A) 04/30/2023 2155   BILIRUBINUR NEGATIVE 04/30/2023 2155   BILIRUBINUR negative 09/20/2021 1620   KETONESUR NEGATIVE 04/30/2023 2155   PROTEINUR NEGATIVE 04/30/2023 2155   UROBILINOGEN 0.2 09/20/2021 1620   NITRITE NEGATIVE 04/30/2023 2155   LEUKOCYTESUR NEGATIVE 04/30/2023 2155    Sepsis Labs: Lactic Acid, Venous    Component Value Date/Time   LATICACIDVEN 1.1 05/03/2023 0657    MICROBIOLOGY: No results found for this or any previous visit (from the past 240 hour(s)).   RADIOLOGY STUDIES/RESULTS: No results found.   LOS: 17 days   Jeoffrey Massed, MD  [facility]    To contact the attending provider between 7A-7P or the covering provider during after hours 7P-7A, please log into the web site [email or web address] and access using universal [place] password for that web site. If you do not have the password, please call the hospital operator.  05/17/2023, 2:22 PM

## 2023-05-17 NOTE — Progress Notes (Signed)
Occupational Therapy Treatment Patient Details Name: Jared [name] MRN: [id number] DOB: 21-Sep-1955 Today's Date: 05/17/2023   History of present illness 67 y.o. male presents to [facility] 04/30/23 w/ acute L lower abdominal pain, L testicle pain, and h/a. Admitted w/ sepsis 2/2 acute perforated appendicitis s/p laparoscopic appendectomy 11/20. Echo w/ density in IVC, VQ scan negative. 11/24 developed Acute on chronic hypoxic respiratory failure, transferred to ICU. PMHx: AVM, chronic respiratory failure (2L O2 at home) CABG, CAD, CAP, CHF, COPD, CVA, HTN, A flutter, DM2.   OT comments  Pt. Seen for skilled OT treatment.  Session focused on lb adls and energy conservation strategies.  Pt. Able to demo lb dressing with S.  Provided energy conservation strategies.  Pt. Receptive and also able to verbalize current strategies he has in place prior.  Good awareness of when rest breaks are needed.  Cont. With current acute OT POC.  Cont. To progress activity tolerance and adls next session.        If plan is discharge home, recommend the following:  A little help with bathing/dressing/bathroom;A little help with walking and/or transfers;Assistance with cooking/housework;Assist for transportation;Help with stairs or ramp for entrance   Equipment Recommendations       Recommendations for Other Services      Precautions / Restrictions Precautions Precautions: Fall Precaution Comments: abdominal surgery, jp drains, open drain  abdomen Restrictions Weight Bearing Restrictions: No       Mobility Bed Mobility Overal bed mobility: Modified Independent         Sit to supine: Supervision   General bed mobility comments: able to swing bles into bed without asssistance and settle into bed    Transfers                         Balance                                           ADL either performed or assessed with clinical judgement   ADL Overall ADL's : Needs  assistance/impaired                     Lower Body Dressing: Set up;Sitting/lateral leans Lower Body Dressing Details (indicate cue type and reason): able to demo reaching bles for management of socks and lb dressing               General ADL Comments: provided education on energy conservation strategies.  pt. able to give examples already in place that he uses.  spoke of rest breaks and pacing tasks out. states he often preps b.fast in multiple steps also and rests when he needs to    [facility] Assessment              Vision       Perception     Praxis      Cognition Arousal: Alert Behavior During Therapy: Flat affect Overall Cognitive Status: Within Functional Limits for tasks assessed                                          Exercises      Shoulder Instructions       General Comments Attempted to take portable o2 tank out of the room, pt.  States it stays in the room to transfer nasal canula to during ambulation to/from b.room with nursing staff.  Pt. Requesting new o2 tape attachment.  Also not to be placed on R hand as it is his dominant hand and creates difficulty when eating.  Provided hand washing for pt. After removed and placed a new one on L  hand.  Asked pt. Which finger and he did not have a verbalized preference.  Placed on L index finger.  Had pt. Check set up and access to his belongings and tray table.  Pt. States everything fine and no other questions or needs.  Left with CNA in room taking vitals and will set the bed alarm for pt.     Pertinent Vitals/ Pain       Pain Assessment Pain Assessment: No/denies pain  Home Living                                          Prior Functioning/Environment              Frequency  Min 1X/week        Progress Toward Goals  OT Goals(current goals can now be found in the care plan section)  Progress towards OT goals: Progressing toward goals      Plan      Co-evaluation                 AM-PAC OT "6 Clicks" Daily Activity     Outcome Measure   Help from another person eating meals?: None Help from another person taking care of personal grooming?: A Little Help from another person toileting, which includes using toliet, bedpan, or urinal?: A Little Help from another person bathing (including washing, rinsing, drying)?: A Lot Help from another person to put on and taking off regular upper body clothing?: A Little Help from another person to put on and taking off regular lower body clothing?: A Lot 6 Click Score: 17    End of Session    OT Visit Diagnosis: Unsteadiness on feet (R26.81);Other abnormalities of gait and mobility (R26.89);Muscle weakness (generalized) (M62.81) Pain - Right/Left: Left   Activity Tolerance Patient tolerated treatment well   Patient Left in bed;with call bell/phone within reach;with nursing/sitter in room   Nurse Communication Other (comment) (spoke with rn, states bed alarm needed, asked cna who was in the room with pt. at end of session to engage and she stated she would)        Time: 1478-2956 OT Time Calculation (min): 17 min  Charges: OT General Charges $OT Visit: 1 Visit OT Treatments $Self Care/Home Management : 8-22 mins  Boneta Lucks, COTA/L Acute Rehabilitation [phone]   Alessandra Bevels Lorraine-COTA/L 05/17/2023, 12:25 PM

## 2023-05-17 NOTE — Progress Notes (Signed)
Mobility Specialist Progress Note;   05/17/23 0915  Mobility  Activity Transferred from bed to chair  Level of Assistance Contact guard assist, steadying assist  Assistive Device None  Distance Ambulated (ft) 5 ft  Activity Response Tolerated well  Mobility Referral Yes  Mobility visit 1 Mobility  Mobility Specialist Start Time (ACUTE ONLY) 0915  Mobility Specialist Stop Time (ACUTE ONLY) 0925  Mobility Specialist Time Calculation (min) (ACUTE ONLY) 10 min   Pt agreeable to mobility. Required MinG assistance for transfer from bed to chair. VSS throughout. Pt left in chair with all needs met.   Caesar Bookman Mobility Specialist Please contact via SecureChat or Rehab Office [phone]

## 2023-05-17 NOTE — Progress Notes (Signed)
Patient ID: Oreoluwa Vanderweide, male   DOB: March 05, 1956, 67 y.o.   MRN: [id number] [facility] Surgery Progress Note  16 Days Post-Op  Subjective: CC-  No complaints this morning. Ate most of his breakfast. Denies n/v. Passing flatus, BM x2 yesterday. States that his stool was green. Hgb 7.4 from 6.7 after 1U PRBCs yesterday.  Objective: Vital signs in last 24 hours: Temp:  [98 F (36.7 C)-98.5 F (36.9 C)] 98.2 F (36.8 C) (12/06 0800) Pulse Rate:  [78-92] 81 (12/06 0800) Resp:  [14-20] 15 (12/06 0800) BP: (118-140)/(66-82) 119/66 (12/06 0800) SpO2:  [100 %] 100 % (12/06 0800) Last BM Date : 05/15/23  Intake/Output from previous day: 12/05 0701 - 12/06 0700 In: 635 [Blood:630] Out: 1988 [Urine:1975; Drains:13] Intake/Output this shift: No intake/output data recorded.  PE: Gen:  Alert, NAD Abd: soft, mild distension, nontender, wound at umbilicus open and scabbing without erythema drainage. Left sided surgical drain scant serosanguinous fluid in bulb, IR transgluteal drain scant bloody fluid in bulb  Lab Results:  Recent Labs    05/16/23 0533 05/16/23 1942 05/17/23 0343  WBC 8.8  --  9.6  HGB 6.7* 7.5* 7.4*  HCT 23.7* 25.3* 25.8*  PLT 549*  --  571*   BMET Recent Labs    05/16/23 0533 05/17/23 0343  NA 136 138  K 4.9 4.3  CL 90* 94*  CO2 40* 38*  GLUCOSE 137* 128*  BUN 15 16  CREATININE 0.91 0.81  CALCIUM 8.7* 8.7*   PT/INR No results for input(s): "LABPROT", "INR" in the last 72 hours. CMP     Component Value Date/Time   NA 138 05/17/2023 0343   NA 142 10/25/2022 1027   K 4.3 05/17/2023 0343   CL 94 (L) 05/17/2023 0343   CO2 38 (H) 05/17/2023 0343   GLUCOSE 128 (H) 05/17/2023 0343   BUN 16 05/17/2023 0343   BUN 14 10/25/2022 1027   CREATININE 0.81 05/17/2023 0343   CREATININE 1.20 07/30/2016 1431   CALCIUM 8.7 (L) 05/17/2023 0343   PROT 6.1 (L) 05/17/2023 0343   PROT 6.7 10/25/2022 1027   ALBUMIN 2.5 (L) 05/17/2023 0343   ALBUMIN 4.4 10/25/2022  1027   AST 69 (H) 05/17/2023 0343   ALT 74 (H) 05/17/2023 0343   ALKPHOS 151 (H) 05/17/2023 0343   BILITOT 0.4 05/17/2023 0343   BILITOT 0.3 10/25/2022 1027   GFRNONAA >60 05/17/2023 0343   GFRNONAA 64 07/30/2016 1431   GFRAA 91 01/29/2020 1020   GFRAA 74 07/30/2016 1431   Lipase     Component Value Date/Time   LIPASE 41 09/20/2021 1620       Studies/Results: No results found.  Anti-infectives: Anti-infectives (From admission, onward)    Start     Dose/Rate Route Frequency Ordered Stop   05/16/23 1200  amoxicillin-clavulanate (AUGMENTIN) 875-125 MG per tablet 1 tablet        1 tablet Oral Every 12 hours 05/16/23 1109     05/14/23 1800  Ampicillin-Sulbactam (UNASYN) 3 g in sodium chloride 0.9 % 100 mL IVPB  Status:  Discontinued        3 g 200 mL/hr over 30 Minutes Intravenous Every 6 hours 05/14/23 0957 05/16/23 1109   05/07/23 0945  piperacillin-tazobactam (ZOSYN) IVPB 3.375 g  Status:  Discontinued        3.375 g 12.5 mL/hr over 240 Minutes Intravenous Every 8 hours 05/07/23 0851 05/14/23 0957   05/07/23 0930  piperacillin-tazobactam (ZOSYN) IVPB 3.375 g  Status:  Discontinued  3.375 g 100 mL/hr over 30 Minutes Intravenous Every 8 hours 05/07/23 0836 05/07/23 0851   04/30/23 1930  piperacillin-tazobactam (ZOSYN) IVPB 3.375 g        3.375 g 12.5 mL/hr over 240 Minutes Intravenous Every 8 hours 04/30/23 1915 05/05/23 1940   04/30/23 1145  piperacillin-tazobactam (ZOSYN) IVPB 3.375 g        3.375 g 100 mL/hr over 30 Minutes Intravenous  Once 04/30/23 1131 04/30/23 1242        Assessment/Plan POD#16 - lap appy for perforated appendicitis 11/20 - MW - s/p IR drain placement 11/24, culture E coli and Klebsiella pneumoniae.   - CT 12/2 with improving fluid collections, IR drain may be slightly retracted. Surgical drain near other fluid collection - S/p IR drain exchange 12/3 - continue IR and surgical drain, antibiotics. Plan to continue antibiotics until repeat  imaging in the next week or so given persistent fluid collections on last CT. Antibiotics narrowed to Augmentin - encourage PO intake, protein shakes - continue miralax/colace - oob/pulm toilet; encouraged ambulation in halls. Continue therapies - rec SNF - s/p 1u PRBCs 12/5 for Hgb 6.7, Hgb without appropriate rise today at 7.4. VSS. Occult blood pending. Discussed with primary team, plan to hold xarelto and ASA today and monitor   FEN: reg diet, Boost/Ensure ID: zosyn 11/19>>12/3, unasyn 12/3>> VTE: hold xarelto 12/6 due to anemia   AKI, creatinine improving - nephrology s/o Syncope COPD/Chronic resp failure - on 2 lpm at baseline HTN Atrial flutter on xarelto H/o CVA PVD Tobacco use    LOS: 17 days    Franne Forts, [facility] Surgery 05/17/2023, 8:54 AM Please see Amion for pager number during day hours 7:00am-4:30pm

## 2023-05-17 NOTE — Plan of Care (Signed)

## 2023-05-17 NOTE — Progress Notes (Signed)
Physical Therapy Treatment Patient Details Name: Jared [name] MRN: [id number] DOB: 1955/08/29 Today's Date: 05/17/2023   History of Present Illness 67 y.o. male presents to [facility] 04/30/23 w/ acute L lower abdominal pain, L testicle pain, and h/a. Admitted w/ sepsis 2/2 acute perforated appendicitis s/p laparoscopic appendectomy 11/20. Echo w/ density in IVC, VQ scan negative. 11/24 developed Acute on chronic hypoxic respiratory failure, transferred to ICU. PMHx: AVM, chronic respiratory failure (2L O2 at home) CABG, CAD, CAP, CHF, COPD, CVA, HTN, A flutter, DM2.    PT Comments  Pt seen for PT tx with pt agreeable. Pt denies pain but reports feeling generally unwell today. Pt is able to ambulate room<>stairs in hallway with rollator & supervision, no overt LOB & no seated rest breaks required. Pt negotiated 1 step x 4 times to simulate 4 steps to enter his home with pt able to do so with CGA. Pt is making steady progress with mobility. Will continue to follow pt acutely to address balance, strengthening, & gait with LRAD.    If plan is discharge home, recommend the following: A little help with walking and/or transfers;A little help with bathing/dressing/bathroom;Assistance with cooking/housework;Assist for transportation;Help with stairs or ramp for entrance   Can travel by private vehicle     Yes  Equipment Recommendations  None recommended by PT    Recommendations for Other Services       Precautions / Restrictions Precautions Precautions: Fall Precaution Comments: abdominal surgery, jp drains, open drain  abdomen Restrictions Weight Bearing Restrictions: No     Mobility  Bed Mobility Overal bed mobility: Modified Independent Bed Mobility: Supine to Sit     Supine to sit: Modified independent (Device/Increase time), HOB elevated, Used rails          Transfers Overall transfer level: Needs assistance Equipment used: Rollator (4 wheels) Transfers: Sit to/from Stand Sit to  Stand: Supervision           General transfer comment: ability to manage rollator brakes without cuing ~50% of the time    Ambulation/Gait Ambulation/Gait assistance: Supervision Gait Distance (Feet): 150 Feet (+ 150 ft) Assistive device: Rollator (4 wheels) Gait Pattern/deviations: Decreased step length - right, Decreased stride length, Decreased step length - left, Decreased dorsiflexion - right Gait velocity: decreased         Stairs Stairs: Yes Stairs assistance: Contact guard assist Stair Management: One rail Left Number of Stairs: 1 (x 4 (6")) General stair comments: Pt ascended & descended 1 step x 4 times with L rail & CGA without overt LOB.   Wheelchair Mobility     Tilt Bed    Modified Rankin (Stroke Patients Only)       Balance Overall balance assessment: Needs assistance Sitting-balance support: No upper extremity supported, Feet supported Sitting balance-Leahy Scale: Fair     Standing balance support: Bilateral upper extremity supported, During functional activity, Reliant on assistive device for balance Standing balance-Leahy Scale: Fair                              Cognition Arousal: Alert Behavior During Therapy: WFL for tasks assessed/performed Overall Cognitive Status: Within Functional Limits for tasks assessed                                          Exercises      General  Comments General comments (skin integrity, edema, etc.): Pt on 3L/min via nasal cannula, SPO2 >90% throughout session, HR 121 bpm at end of session after walking back to room from negotiating stairs      Pertinent Vitals/Pain Pain Assessment Pain Assessment: No/denies pain    Home Living                          Prior Function            PT Goals (current goals can now be found in the care plan section) Acute Rehab PT Goals Patient Stated Goal: to go home PT Goal Formulation: With patient Time For Goal  Achievement: 05/20/23 Potential to Achieve Goals: Good Progress towards PT goals: Progressing toward goals    Frequency    Min 1X/week      PT Plan      Co-evaluation              AM-PAC PT "6 Clicks" Mobility   Outcome Measure  Help needed turning from your back to your side while in a flat bed without using bedrails?: None Help needed moving from lying on your back to sitting on the side of a flat bed without using bedrails?: A Little Help needed moving to and from a bed to a chair (including a wheelchair)?: A Little Help needed standing up from a chair using your arms (e.g., wheelchair or bedside chair)?: A Little Help needed to walk in hospital room?: A Little Help needed climbing 3-5 steps with a railing? : A Little 6 Click Score: 19    End of Session Equipment Utilized During Treatment: Oxygen Activity Tolerance: Patient tolerated treatment well Patient left: in bed;with call bell/phone within reach (in handoff to OT)   PT Visit Diagnosis: Unsteadiness on feet (R26.81);Repeated falls (R29.6);Other abnormalities of gait and mobility (R26.89);Muscle weakness (generalized) (M62.81)     Time: 1191-4782 PT Time Calculation (min) (ACUTE ONLY): 18 min  Charges:    $Therapeutic Activity: 8-22 mins PT General Charges $$ ACUTE PT VISIT: 1 Visit                     Aleda Grana, PT, DPT 05/17/23, 11:47 AM    Sandi Mariscal 05/17/2023, 11:46 AM

## 2023-05-17 NOTE — TOC Progression Note (Signed)
Transition of Care [facility]) - Progression Note    Patient Details  Name: Jared [name] MRN: [id number] Date of Birth: 26-Mar-1956  Transition of Care [facility]) CM/SW Contact  Erin Sons, [place] Phone Number: 05/17/2023, 1:08 PM  Clinical Narrative:     [facility] still pending for [facility] at this time. Pt will likely remain admitted over weekend as he is not medically ready. TOC will continue to follow.   Expected Discharge Plan: Skilled Nursing Facility Barriers to Discharge: Continued Medical Work up, English as a second language teacher  Expected Discharge Plan and Services In-house Referral: Clinical Social Work Discharge Planning Services: [employer] Consult Post Acute Care Choice: Skilled Nursing Facility Living arrangements for the past 2 months: Single Family Home                 DME Arranged: N/A         HH Arranged: NA           Social Determinants of Health (SDOH) Interventions SDOH Screenings   Food Insecurity: No Food Insecurity (04/30/2023)  Housing: Low Risk  (04/30/2023)  Transportation Needs: No Transportation Needs (04/30/2023)  Utilities: Not At Risk (04/30/2023)  Alcohol Screen: Low Risk  (02/06/2023)  Depression (PHQ2-9): Low Risk  (02/04/2023)  Recent Concern: Depression (PHQ2-9) - High Risk (01/09/2023)  Financial Resource Strain: Medium Risk (02/06/2023)  Physical Activity: Insufficiently Active (02/06/2023)  Social Connections: Moderately Integrated (02/06/2023)  Stress: No Stress Concern Present (02/06/2023)  Tobacco Use: High Risk (05/05/2023)  Health Literacy: Adequate Health Literacy (02/06/2023)    Readmission Risk Interventions    12/27/2022    2:20 PM 12/14/2021    1:38 PM 02/01/2021    3:22 PM  Readmission Risk Prevention Plan  Post Dischage Appt Complete Complete   Medication Screening Complete Complete   Transportation Screening Complete Complete Complete  PCP or Specialist Appt within 5-7 Days   Complete  Home Care Screening   Complete  Medication Review (RN  CM)   Complete

## 2023-05-18 DIAGNOSIS — K921 Melena: Secondary | ICD-10-CM | POA: Diagnosis not present

## 2023-05-18 DIAGNOSIS — I10 Essential (primary) hypertension: Secondary | ICD-10-CM | POA: Diagnosis not present

## 2023-05-18 DIAGNOSIS — D62 Acute posthemorrhagic anemia: Secondary | ICD-10-CM

## 2023-05-18 DIAGNOSIS — K3532 Acute appendicitis with perforation and localized peritonitis, without abscess: Secondary | ICD-10-CM | POA: Diagnosis not present

## 2023-05-18 DIAGNOSIS — R55 Syncope and collapse: Secondary | ICD-10-CM | POA: Diagnosis not present

## 2023-05-18 DIAGNOSIS — Z7901 Long term (current) use of anticoagulants: Secondary | ICD-10-CM | POA: Diagnosis not present

## 2023-05-18 DIAGNOSIS — N179 Acute kidney failure, unspecified: Secondary | ICD-10-CM | POA: Diagnosis not present

## 2023-05-18 HISTORY — DX: Acute posthemorrhagic anemia: D62

## 2023-05-18 LAB — CBC
HCT: 25.3 % — ABNORMAL LOW (ref 39.0–52.0)
Hemoglobin: 7.4 g/dL — ABNORMAL LOW (ref 13.0–17.0)
MCH: 26.8 pg (ref 26.0–34.0)
MCHC: 29.2 g/dL — ABNORMAL LOW (ref 30.0–36.0)
MCV: 91.7 fL (ref 80.0–100.0)
Platelets: 585 10*3/uL — ABNORMAL HIGH (ref 150–400)
RBC: 2.76 MIL/uL — ABNORMAL LOW (ref 4.22–5.81)
RDW: 17.6 % — ABNORMAL HIGH (ref 11.5–15.5)
WBC: 9.3 10*3/uL (ref 4.0–10.5)
nRBC: 0.2 % (ref 0.0–0.2)

## 2023-05-18 LAB — GLUCOSE, CAPILLARY
Glucose-Capillary: 181 mg/dL — ABNORMAL HIGH (ref 70–99)
Glucose-Capillary: 173 mg/dL — ABNORMAL HIGH (ref 70–99)
Glucose-Capillary: 135 mg/dL — ABNORMAL HIGH (ref 70–99)
Glucose-Capillary: 174 mg/dL — ABNORMAL HIGH (ref 70–99)

## 2023-05-18 MED ORDER — PANTOPRAZOLE SODIUM 40 MG PO TBEC
40.0000 mg | DELAYED_RELEASE_TABLET | Freq: Two times a day (BID) | ORAL | Status: DC
  Administered 2023-05-18 – 2023-05-21 (×6): 40 mg via ORAL
  Filled 2023-05-18 (×6): qty 1

## 2023-05-18 MED ORDER — FERROUS SULFATE 325 (65 FE) MG PO TABS
325.0000 mg | ORAL_TABLET | Freq: Two times a day (BID) | ORAL | Status: DC
Start: 2023-05-18 — End: 2023-05-21
  Administered 2023-05-18 – 2023-05-21 (×6): 325 mg via ORAL
  Filled 2023-05-18 (×7): qty 1

## 2023-05-18 NOTE — Consult Note (Addendum)
Referring Provider: Dr. Jeoffrey Massed Primary Care Physician:  [name], MD Primary Gastroenterologist: Dr. Amada Jupiter  Reason for Consultation: Anemia, black stool  HPI: Jared [name] is a 67 y.o. male with a past medical history significant for hypertension, coronary artery disease, combined systolic and diastolic CHF, nonischemic cardiomyopathy, paroxysmal atrial fibrillation, CVA, COPD, chronic hypoxic and hypercarbic respiratory failure on home O2, diabetes mellitus type 2, IDA, PUD, H. 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  He was admitted to the hospital 04/30/2023 with acute abdominal pain diagnosed with sepsis secondary to acute appendicitis with perforation.  He underwent a laparoscopic appendectomy 11/20 and subsequently required transgluteal pelvic drain placement for postoperative abscess.  Since admission, his hemoglobin level has drifted downward. Admission Hg 10.1 -> 9.6 ->8.2 -> 7s and down to 6.7 on 05/16/2023.  He was transfused 1 unit of PRBCs.  Posttransfusion hemoglobin 7.5 -> 7.4.  Patient endorsed passing dark green or black stools, no bright red rectal bleeding.  A GI consult was requested for further evaluation regarding anemia and suspected recurrent GI bleed.  Patient stated he has dark green or black solid and loose stools at home which is no different than the stools he has passed during his hospital admission.  No bright red rectal bleeding.  He takes oral iron at home 1 tablet twice daily.  He denies having any heartburn or dysphagia.  No upper abdominal pain.  He has mild RLQ pain which started prior to this hospital admission.  He takes ASA 81 mg daily at home.  Xarelto on hold since 12/5.  No alcohol use.  He continues to smoke less than 1 pack of cigarettes daily at home, he takes his oxygen off and goes outside to smoke. His most recent small bowel enteroscopy 03/2022 showed 3 nonbleeding AVMs in the duodenum treated with  APC.  Normal EGD 12/2022, endo clip found in the stomach.  See extensive summary of GI procedures below.  GI PROCEDURES:  06/14/2019 capsule endoscopy: Complete capsule endoscopy with findings of gastric erythema/gastritis, no AVMs visualized, no active bleeding 10/28/2018 small bowel enteroscopy: Small AVM ablated, otherwise normal 09/28/2018 small bowel enteroscopy: Normal 09/26/2018 capsule endoscopy: Active bleeding 1 minute after entry into the duodenum, enteroscopy scheduled the next day 08/2018 colonoscopy for GI bleeding:  complete exam, excellent prep.  Entire colon normal except for hemorrhoids 10/09/20 small bowel endoscopy: Normal small bowel enteroscopy to the proximal jejunum. Suspect iron deficiency secondary to intermittent mucosal oozing in the patient on chronic oral anticoagulation therapy 11/12/20 EGD/Enteroscopy:   -No gross lesions in esophagus proximally.  -Salmon-colored mucosa noted in distal esoohagus - not biopsied due to concern for possible underlying varices.  -Hematin (altered blood/coffee-ground-like material) in the entire stomach - lavaged. - 2 gastric ulcers as described above with a nonbleeding visible vessel (Forrest Class IIa). - Gastritis. Biopsied. - Blood in the entire examined duodenum - lavaged. - No gross lesions in the entire examined duodenum. - Jejunal blood - lavaged . - No gross lesions in the entire examined proximal jejunum. Tattooed distal extent.   A. GASTRIC BIOPSY:  - Chronic active gastritis with Helicobacter pylori with focal  intestinal metaplasia. Warthin-Starry is positive for Helicobacter pylori.  - No dysplasia or malignancy   11/19/20 EGD. Repeat exam done because patient returned to hospital hypotensive with abdominal pain  -Z-line irregular, 44 cm from the incisors. - Endoclips were found in the gastric cardia/fundus without evidence of rebleeding or stigmata or recent  bleeding. Good treatment effective from  endoclipping.  03/22/2022 small bowel endoscopy: - Z-line irregular.  - Normal esophagus  - Hemostasis clip x 2 were found in the stomach.  - Normal stomach otherwise  - Two non-bleeding angiodysplastic lesions in the duodenum. Treated with argon plasma coagulation (APC).  - A single non-bleeding angiodysplastic lesion in the duodenum. Treated with argon plasma coagulation (APC).  - A tattoo was seen in the jejunum.  - Normal small bowel otherwise.  EGD 12/27/2022: - An endoclip was found in the stomach. - Normal examined duodenum.  - No specimens collected.  Past Medical History:  Diagnosis Date   AVM (arteriovenous malformation)    CAD (coronary artery disease)    a. LHC 5/12:  LAD 20, pLCx 20, pRCA 40, dRCA 40, EF 35%, diff HK  //  b. Myoview 4/16: Overall Impression:  High risk stress nuclear study There is no evidence of ischemia.  There is severe LV dysfunction. LV Ejection Fraction: 30%.  LV Wall Motion:  There is global LV hypokinesis.     CAP (community acquired pneumonia) 09/2013   Chronic combined systolic and diastolic CHF (congestive heart failure) (HCC)    a. Echo 4/16:Mild LVH, EF 40-45%, diffuse HK //  b. Echo 8/17: EF 35-40%, diffuse HK, diastolic dysfunction, aortic sclerosis, trivial MR, moderate LAE, normal RVSF, moderate RAE, mild TR, PASP 42 mmHg // c. Echo 4/18: Mild concentric LVH, EF 30-35, normal wall motion, grade 1 diastolic dysfunction, PASP 49   Chronic respiratory failure (HCC)    Cluster headache    "hx; haven't had one in awhile" (01/09/2016)   COPD (chronic obstructive pulmonary disease) (HCC)    [name] 01/09/2016   DM (diabetes mellitus) (HCC)    History of CVA (cerebrovascular accident)    Hypertension    IDA (iron deficiency anemia)    Moderate tobacco use disorder    NICM (nonischemic cardiomyopathy) (HCC)    Nicotine addiction    Tobacco abuse    Type 2 diabetes mellitus (HCC) 05/14/2016    Past Surgical History:  Procedure Laterality Date    ABDOMINAL AORTOGRAM W/LOWER EXTREMITY N/A 12/07/2022   Procedure: ABDOMINAL AORTOGRAM W/LOWER EXTREMITY;  Surgeon: Leonie Douglas, MD;  Location: [facility];  Service: Cardiovascular;  Laterality: N/A;   BIOPSY  11/12/2020   Procedure: BIOPSY;  Surgeon: Lemar Lofty., MD;  Location: [facility] ENDOSCOPY;  Service: Gastroenterology;;   CARDIAC CATHETERIZATION  10/2010   LM normal, LAD with 20% irregularities, LCX with 20%, RCA with 40% prox and 40% distal - EF of 35%   CATARACT EXTRACTION, BILATERAL     COLONOSCOPY W/ BIOPSIES AND POLYPECTOMY     COLONOSCOPY WITH PROPOFOL N/A 09/06/2018   Procedure: COLONOSCOPY WITH PROPOFOL;  Surgeon: Tressia Danas, MD;  Location: [facility] ENDOSCOPY;  Service: Gastroenterology;  Laterality: N/A;   ENTEROSCOPY N/A 09/28/2018   Procedure: ENTEROSCOPY;  Surgeon: Jeani Hawking, MD;  Location: [facility] ENDOSCOPY;  Service: Endoscopy;  Laterality: N/A;   ENTEROSCOPY N/A 10/28/2018   Procedure: ENTEROSCOPY;  Surgeon: Tressia Danas, MD;  Location: [facility] ENDOSCOPY;  Service: Gastroenterology;  Laterality: N/A;   ENTEROSCOPY N/A 10/09/2020   Procedure: ENTEROSCOPY;  Surgeon: Hilarie Fredrickson, MD;  Location: [facility] ENDOSCOPY;  Service: Endoscopy;  Laterality: N/A;   ENTEROSCOPY N/A 03/22/2022   Procedure: ENTEROSCOPY;  Surgeon: Benancio Deeds, MD;  Location: [facility] ENDOSCOPY;  Service: Gastroenterology;  Laterality: N/A;   ESOPHAGOGASTRODUODENOSCOPY N/A 11/12/2020   Procedure: ESOPHAGOGASTRODUODENOSCOPY (EGD);  Surgeon: Lemar Lofty., MD;  Location: [facility] ENDOSCOPY;  Service: Gastroenterology;  Laterality: N/A;   ESOPHAGOGASTRODUODENOSCOPY (EGD) WITH PROPOFOL N/A 09/05/2018   Procedure: ESOPHAGOGASTRODUODENOSCOPY (EGD) WITH PROPOFOL;  Surgeon: Benancio Deeds, MD;  Location: [facility] ENDOSCOPY;  Service: Gastroenterology;  Laterality: N/A;   ESOPHAGOGASTRODUODENOSCOPY (EGD) WITH PROPOFOL N/A 11/19/2020   Procedure: ESOPHAGOGASTRODUODENOSCOPY (EGD) WITH PROPOFOL;  Surgeon:  Beverley Fiedler, MD;  Location: [facility] ENDOSCOPY;  Service: Gastroenterology;  Laterality: N/A;   ESOPHAGOGASTRODUODENOSCOPY (EGD) WITH PROPOFOL N/A 12/27/2022   Procedure: ESOPHAGOGASTRODUODENOSCOPY (EGD) WITH PROPOFOL;  Surgeon: Napoleon Form, MD;  Location: [facility] ENDOSCOPY;  Service: Gastroenterology;  Laterality: N/A;   EXCISION MASS HEAD     GIVENS CAPSULE STUDY N/A 09/06/2018   Procedure: GIVENS CAPSULE STUDY;  Surgeon: Tressia Danas, MD;  Location: [facility] ENDOSCOPY;  Service: Gastroenterology;  Laterality: N/A;   GIVENS CAPSULE STUDY N/A 09/26/2018   Procedure: GIVENS CAPSULE STUDY;  Surgeon: Beverley Fiedler, MD;  Location: [facility] ENDOSCOPY;  Service: Gastroenterology;  Laterality: N/A;   GIVENS CAPSULE STUDY N/A 06/14/2019   Procedure: GIVENS CAPSULE STUDY;  Surgeon: Napoleon Form, MD;  Location: [facility] ENDOSCOPY;  Service: Endoscopy;  Laterality: N/A;   HEMOSTASIS CLIP PLACEMENT  11/12/2020   Procedure: HEMOSTASIS CLIP PLACEMENT;  Surgeon: Lemar Lofty., MD;  Location: [facility] ENDOSCOPY;  Service: Gastroenterology;;   HEMOSTASIS CONTROL  11/12/2020   Procedure: HEMOSTASIS CONTROL;  Surgeon: Lemar Lofty., MD;  Location: [facility] ENDOSCOPY;  Service: Gastroenterology;;   HOT HEMOSTASIS N/A 10/28/2018   Procedure: HOT HEMOSTASIS (ARGON PLASMA COAGULATION/BICAP);  Surgeon: Tressia Danas, MD;  Location: [facility] ENDOSCOPY;  Service: Gastroenterology;  Laterality: N/A;   HOT HEMOSTASIS N/A 11/12/2020   Procedure: HOT HEMOSTASIS (ARGON PLASMA COAGULATION/BICAP);  Surgeon: Lemar Lofty., MD;  Location: [facility] ENDOSCOPY;  Service: Gastroenterology;  Laterality: N/A;   HOT HEMOSTASIS N/A 03/22/2022   Procedure: HOT HEMOSTASIS (ARGON PLASMA COAGULATION/BICAP);  Surgeon: Benancio Deeds, MD;  Location: [facility] ENDOSCOPY;  Service: Gastroenterology;  Laterality: N/A;   INCISION AND DRAINAGE PERIRECTAL ABSCESS N/A 06/05/2017   Procedure: IRRIGATION AND DEBRIDEMENT PERIRECTAL ABSCESS;  Surgeon: Andria Meuse, MD;  Location: [facility] OR;  Service: General;  Laterality: N/A;   IR CATHETER TUBE CHANGE  05/14/2023   LAPAROSCOPIC APPENDECTOMY N/A 05/01/2023   Procedure: APPENDECTOMY LAPAROSCOPIC;  Surgeon: Emelia Loron, MD;  Location: [facility] OR;  Service: General;  Laterality: N/A;   PERIPHERAL VASCULAR INTERVENTION  12/07/2022   Procedure: PERIPHERAL VASCULAR INTERVENTION;  Surgeon: Leonie Douglas, MD;  Location: [facility];  Service: Cardiovascular;;   SUBMUCOSAL TATTOO INJECTION  11/12/2020   Procedure: SUBMUCOSAL TATTOO INJECTION;  Surgeon: Lemar Lofty., MD;  Location: [facility] ENDOSCOPY;  Service: Gastroenterology;;   VIDEO BRONCHOSCOPY Bilateral 05/08/2016   Procedure: VIDEO BRONCHOSCOPY WITH FLUORO;  Surgeon: Oretha Milch, MD;  Location: [facility] ENDOSCOPY;  Service: Cardiopulmonary;  Laterality: Bilateral;    Prior to Admission medications   Medication Sig Start Date End Date Taking? Authorizing Provider  acetaminophen (TYLENOL) 500 MG tablet Take 2 tablets (1,000 mg total) by mouth every 8 (eight) hours as needed for moderate pain. 01/03/23  Yes Newlin, Enobong, MD  albuterol (VENTOLIN HFA) 108 (90 Base) MCG/ACT inhaler INHALE 2 PUFFS INTO THE LUNGS EVERY 6 (SIX) HOURS AS NEEDED FOR WHEEZING OR SHORTNESS OF BREATH. Patient taking differently: Inhale 2 puffs into the lungs daily as needed for wheezing or shortness of breath. 12/06/22  Yes [name], MD  aspirin EC 81 MG tablet Take 1 tablet (81 mg total) by mouth daily. Swallow  whole. 12/07/22  Yes Leonie Douglas, MD  atorvastatin (LIPITOR) 80 MG tablet Take 1 tablet (80 mg total) by mouth daily. 02/04/23  Yes [name], MD  DULoxetine (CYMBALTA) 60 MG capsule Take 1 capsule (60 mg total) by mouth daily. For chronic leg pains 01/09/23  Yes [name], MD  ergocalciferol (DRISDOL) 1.25 MG (50000 UT) capsule Take 1 capsule (50,000 Units total) by mouth once a week. 02/26/23  Yes [name], MD  ezetimibe (ZETIA) 10  MG tablet Take 1 tablet (10 mg total) by mouth daily. 01/31/23  Yes Pricilla Riffle, MD  ferrous sulfate (FEROSUL) 325 (65 FE) MG tablet Take 1 tablet (325 mg total) by mouth daily with breakfast. 01/30/23  Yes Danis, Andreas Blower, MD  folic acid (FOLVITE) 1 MG tablet Take 1 tablet (1 mg total) by mouth daily. 01/30/23  Yes [name], MD  furosemide (LASIX) 40 MG tablet Take 1-2 tablets (40-80 mg total) by mouth 2 (two) times daily as needed.Take an extra pill with worsening pedal edema. 01/09/23  Yes [name], MD  gabapentin (NEURONTIN) 300 MG capsule Take 1 capsule (300 mg total) by mouth 2 (two) times daily. 02/25/23  Yes Kathryne Hitch, MD  hydrALAZINE (APRESOLINE) 50 MG tablet Take 1 tablet (50 mg total) by mouth every 8 (eight) hours. 02/04/23  Yes [name], MD  isosorbide mononitrate (IMDUR) 30 MG 24 hr tablet Take 1 tablet (30 mg total) by mouth daily. 01/30/23  Yes [name], MD  metoprolol succinate (TOPROL-XL) 50 MG 24 hr tablet Take 1 tablet (50 mg total) by mouth daily. Take with or immediately following a meal. 02/04/23  Yes Newlin, Enobong, MD  nitroGLYCERIN (NITROSTAT) 0.4 MG SL tablet Place 1 tablet (0.4 mg total) under the tongue every 5 (five) minutes as needed for chest pain. 11/19/20 04/30/23 Yes Albertine Grates, MD  OXYGEN Inhale 2 L/min into the lungs continuous.   Yes [provider]  pantoprazole (PROTONIX) 40 MG tablet Take 1 tablet (40 mg total) by mouth daily. 02/04/23  Yes [name], MD  rivaroxaban (XARELTO) 20 MG TABS tablet Take 1 tablet (20 mg total) by mouth daily with supper. 10/25/22  Yes [name], MD  Accu-Chek Softclix Lancets lancets Use to check blood sugar once daily. 01/09/23   [name], MD  Blood Glucose Monitoring Suppl (ACCU-CHEK GUIDE) w/Device KIT Use to check blood sugar once daily. 01/30/23   [name], MD  Budeson-Glycopyrrol-Formoterol (BREZTRI AEROSPHERE) 160-9-4.8 MCG/ACT AERO Take 2 puffs first thing in  morning and then another 2 puffs about 12 hours later. 02/25/23   Nyoka Cowden, MD  glucose blood (ACCU-CHEK GUIDE) test strip Use to check blood sugar once daily. 01/09/23   [name], MD  TUDORZA PRESSAIR 400 MCG/ACT AEPB INHALE 1 PUFF INTO THE LUNGS IN THE MORNING AND AT BEDTIME. 03/29/20 04/04/20  Nyoka Cowden, MD    Current Facility-Administered Medications  Medication Dose Route Frequency Provider Last Rate Last Admin   acetaminophen (TYLENOL) tablet 650 mg  650 mg Oral Q6H Meuth, Brooke A, PA-C   650 mg at 05/18/23 1206   albuterol (PROVENTIL) (2.5 MG/3ML) 0.083% nebulizer solution 2.5 mg  2.5 mg Nebulization Q2H PRN Jonah Blue, MD   2.5 mg at 05/06/23 1212   amoxicillin-clavulanate (AUGMENTIN) 875-125 MG per tablet 1 tablet  1 tablet Oral Q12H Maretta Bees, MD   1 tablet at 05/18/23 0847   arformoterol (BROVANA) nebulizer solution 15 mcg  15 mcg Nebulization  BID Simonne Martinet, NP   15 mcg at 05/18/23 0816   atorvastatin (LIPITOR) tablet 80 mg  80 mg Oral Daily Eric Form, PA-C   80 mg at 05/18/23 1308   bisacodyl (DULCOLAX) suppository 10 mg  10 mg Rectal Daily PRN Axel Filler, MD       bisacodyl (DULCOLAX) suppository 10 mg  10 mg Rectal Once Meuth, Brooke A, PA-C       budesonide (PULMICORT) nebulizer solution 0.5 mg  0.5 mg Nebulization BID Simonne Martinet, NP   0.5 mg at 05/18/23 6578   Chlorhexidine Gluconate Cloth 2 % PADS 6 each  6 each Topical Daily Hunsucker, Lesia Sago, MD   6 each at 05/18/23 0848   docusate sodium (COLACE) capsule 100 mg  100 mg Oral BID Meuth, Brooke A, PA-C   100 mg at 05/15/23 0818   DULoxetine (CYMBALTA) DR capsule 60 mg  60 mg Oral Daily Eric Form, PA-C   60 mg at 05/18/23 0848   ezetimibe (ZETIA) tablet 10 mg  10 mg Oral Daily Eric Form, PA-C   10 mg at 05/18/23 0848   feeding supplement (ENSURE ENLIVE / ENSURE PLUS) liquid 237 mL  237 mL Oral BID BM Jonah Blue, MD   237 mL at 05/18/23 1207    ferrous sulfate tablet 325 mg  325 mg Oral BID WC Maretta Bees, MD   325 mg at 05/18/23 0848   folic acid (FOLVITE) tablet 1 mg  1 mg Oral Daily Eric Form, PA-C   1 mg at 05/18/23 0847   furosemide (LASIX) tablet 40 mg  40 mg Oral Daily Wendall Stade, MD   40 mg at 05/18/23 0847   gabapentin (NEURONTIN) capsule 300 mg  300 mg Oral BID Eric Form, PA-C   300 mg at 05/18/23 0848   haloperidol lactate (HALDOL) injection 2-5 mg  2-5 mg Intravenous Q6H PRN Jonah Blue, MD       hydrALAZINE (APRESOLINE) tablet 50 mg  50 mg Oral Q8H Eric Form, PA-C   50 mg at 05/18/23 1206   isosorbide mononitrate (IMDUR) 24 hr tablet 30 mg  30 mg Oral Daily Eric Form, PA-C   30 mg at 05/18/23 0848   methocarbamol (ROBAXIN) tablet 500 mg  500 mg Oral Q6H PRN Meuth, Brooke A, PA-C       metoprolol succinate (TOPROL-XL) 24 hr tablet 50 mg  50 mg Oral Daily Eric Form, PA-C   50 mg at 05/18/23 0848   morphine (PF) 2 MG/ML injection 1-2 mg  1-2 mg Intravenous Q3H PRN Eric Form, PA-C   2 mg at 05/07/23 0919   multivitamin with minerals tablet 1 tablet  1 tablet Oral Daily Eric Form, PA-C   1 tablet at 05/18/23 0847   nicotine (NICODERM CQ - dosed in mg/24 hours) patch 21 mg  21 mg Transdermal Daily Eric Form, PA-C   21 mg at 05/18/23 4696   oxyCODONE (Oxy IR/ROXICODONE) immediate release tablet 5-10 mg  5-10 mg Oral Q4H PRN Eric Form, PA-C   10 mg at 05/17/23 2223   pantoprazole (PROTONIX) EC tablet 40 mg  40 mg Oral BID Maretta Bees, MD       polyethylene glycol (MIRALAX / GLYCOLAX) packet 17 g  17 g Oral BID Axel Filler, MD   17 g at 05/15/23 0818   revefenacin (YUPELRI) nebulizer solution 175 mcg  175 mcg Nebulization  Daily Simonne Martinet, NP   175 mcg at 05/18/23 1610   sodium chloride flush (NS) 0.9 % injection 5 mL  5 mL Intracatheter Q8H Suttle, Thressa Sheller, MD   5 mL at 05/18/23 1207    Allergies as of 04/30/2023 - Review  Complete 04/30/2023  Allergen Reaction Noted   Lisinopril Cough 06/29/2014    Family History  Problem Relation Age of Onset   Heart disease Mother    Diabetes Mother    Colon cancer Mother    Liver cancer Mother    Cancer Father        type unknown   Diabetes Sister        x 2   Diabetes Brother     Social History   Socioeconomic History   Marital status: Single    Spouse name: Not on file   Number of children: 1   Years of education: Not on file   Highest education level: 12th grade  Occupational History   Occupation: retired  Tobacco Use   Smoking status: Every Day    Current packs/day: 0.50    Average packs/day: 0.5 packs/day for 47.0 years (23.5 ttl pk-yrs)    Types: Cigarettes   Smokeless tobacco: Never   Tobacco comments:    4/5 cigs  Vaping Use   Vaping status: Never Used  Substance and Sexual Activity   Alcohol use: Not Currently    Alcohol/week: 0.0 standard drinks of alcohol    Comment: last drink was before xmas   Drug use: No    Types: Cocaine, Marijuana    Comment: "nothing in 20 years"   Sexual activity: Not Currently  Other Topics Concern   Not on file  Social History Narrative   unemployed   Social Determinants of Health   Financial Resource Strain: Medium Risk (02/06/2023)   Overall Financial Resource Strain (CARDIA)    Difficulty of Paying Living Expenses: Somewhat hard  Food Insecurity: No Food Insecurity (04/30/2023)   Hunger Vital Sign    Worried About Running Out of Food in the Last Year: Never true    Ran Out of Food in the Last Year: Never true  Transportation Needs: No Transportation Needs (04/30/2023)   PRAPARE - Administrator, Civil Service (Medical): No    Lack of Transportation (Non-Medical): No  Physical Activity: Insufficiently Active (02/06/2023)   Exercise Vital Sign    Days of Exercise per Week: 3 days    Minutes of Exercise per Session: 10 min  Stress: No Stress Concern Present (02/06/2023)   [employer] of Occupational Health - Occupational Stress Questionnaire    Feeling of Stress : Not at all  Social Connections: Moderately Integrated (02/06/2023)   Social Connection and Isolation Panel [NHANES]    Frequency of Communication with Friends and Family: More than three times a week    Frequency of Social Gatherings with Friends and Family: Twice a week    Attends Religious Services: More than 4 times per year    Active Member of [employer] or Organizations: Yes    Attends Engineer, structural: More than 4 times per year    Marital Status: Divorced  Intimate Partner Violence: Not At Risk (04/30/2023)   Humiliation, Afraid, Rape, and Kick questionnaire    Fear of Current or Ex-Partner: No    Emotionally Abused: No    Physically Abused: No    Sexually Abused: No    Review of Systems: Gen: Denies fever, sweats or  chills. No weight loss.  CV: Denies chest pain, palpitations or edema. Resp: No shortness of breath at this time, on home oxygen.  GI: See HPI.   GU : Denies urinary burning, blood in urine, increased urinary frequency or incontinence. MS: Denies joint pain, muscles aches or weakness. Derm: Denies rash, itchiness, skin lesions or unhealing ulcers. Psych: Denies depression, anxiety, memory loss or confusion. Heme: Denies easy bruising, bleeding. Neuro:  Denies headaches, dizziness or paresthesias. Endo:  Denies any problems with DM, thyroid or adrenal function.  Physical Exam: Vital signs in last 24 hours: Temp:  [98 F (36.7 C)-98.7 F (37.1 C)] 98 F (36.7 C) (12/07 0800) Pulse Rate:  [75-89] 89 (12/07 0816) Resp:  [14-18] 15 (12/07 0816) BP: (109-133)/(60-82) 111/69 (12/07 0800) SpO2:  [99 %-100 %] 99 % (12/07 0816) Last BM Date : 05/17/23 General: Chronically ill-appearing 67 year old male in no acute distress.   Head:  Normocephalic and atraumatic. Eyes:  No scleral icterus. Conjunctiva pink. Ears:  Normal auditory acuity. Nose:  No deformity,  discharge or lesions. Mouth: No ulcers or lesions.  Neck:  Supple. No lymphadenopathy or thyromegaly.  Lungs: Breath sounds clear throughout. No wheezes, rhonchi or crackles.  On oxygen 2 L nasal cannula. Heart: Regular rate and rhythm, no murmurs. Abdomen: Soft, very mild tenderness to the epigastric and LUQ without rebound or guarding.  Mild RLQ tenderness without rebound or guarding.  LLQ JP drain with a small amount of serosanguineous drainage. Rectal: Deferred. Musculoskeletal:  Symmetrical without gross deformities.  Pulses:  Normal pulses noted. Extremities:  Without clubbing or edema. Neurologic:  Alert and  oriented x 4. No focal deficits.  Skin:  Intact without significant lesions or rashes. Psych:  Alert and cooperative. Normal mood and affect.  Intake/Output from previous day: 12/06 0701 - 12/07 0700 In: -  Out: 420 [Urine:400; Drains:20] Intake/Output this shift: No intake/output data recorded.  Lab Results: Recent Labs    05/16/23 0533 05/16/23 1942 05/17/23 0343 05/18/23 0356  WBC 8.8  --  9.6 9.3  HGB 6.7* 7.5* 7.4* 7.4*  HCT 23.7* 25.3* 25.8* 25.3*  PLT 549*  --  571* 585*   BMET Recent Labs    05/16/23 0533 05/17/23 0343  NA 136 138  K 4.9 4.3  CL 90* 94*  CO2 40* 38*  GLUCOSE 137* 128*  BUN 15 16  CREATININE 0.91 0.81  CALCIUM 8.7* 8.7*   LFT Recent Labs    05/17/23 0343  PROT 6.1*  ALBUMIN 2.5*  AST 69*  ALT 74*  ALKPHOS 151*  BILITOT 0.4   PT/INR No results for input(s): "LABPROT", "INR" in the last 72 hours. Hepatitis Panel No results for input(s): "HEPBSAG", "HCVAB", "HEPAIGM", "HEPBIGM" in the last 72 hours.    Studies/Results: No results found.  IMPRESSION/PLAN:  67 year old male admitted to the hospital 04/30/2023 with abdominal pain secondary to acute appendicitis with perforation and sepsis. S/P laparoscopic appendectomy 05/01/2023 with post op development of residual deep pelvic abscess requiring drain placement by IR  on 11/24 and drain exchange on 12/3.  History of PUD, H. pylori gastritis and numerous hospital admissions with recurrent GI bleed secondary to gastric ulcers and small bowel AVMs.  Admission Hg 10.1 -> 9.6 ->8.2 -> 7s and down to 6.7 on 05/16/2023.  He was transfused 1 unit of PRBCs 12/5.  Posttransfusion hemoglobin 7.5 -> 7.4.  FOBT positive.  Patient endorses chronically passing dark green or black stools.  No bright red blood per the rectum.  His most recent small bowel enteroscopy 03/2022 showed 3 nonbleeding AVMs in the duodenum treated with APC.  Normal EGD 12/2022, Endo Clip found in the stomach.  Hemodynamically stable. -Continue Pantoprazole 40 mg p.o. twice daily -CBC in a.m. -NPO after midnight  -Small bowel enteroscopy with Dr. Marina Goodell tomorrow, benefits and risks discussed including risk with sedation, risk of bleeding, perforation and infection   Acute on chronic iron deficiency anemia secondary to chronic GI blood loss.  On ferrous sulfate 325 mg twice daily at home and during this hospitalization.  Receives IV iron as needed as an outpatient per hematology.  Paroxysmal atrial fibrillation, Xarelto on hold since 12/5.  Combined systolic and diastolic CHF.  LVEF 55 to 60%.  History of COPD, acute on chronic hypoxic respiratory failure on home oxygen  Arnaldo Natal  05/18/2023, 1:54PM   GI ATTENDING  History, laboratories, x-rays, multiple prior endoscopy reports all personally reviewed.  Patient personally seen and examined in the patient's room with the GI nurse practitioner.  Agree with comprehensive consultation note as outlined above.  The patient is admitted with perforated appendicitis.  Continues to convalesce.  He has multiple significant comorbidities as outlined above.  Had been on Xarelto.  History of bleeding AVMs requiring treatment.  He has had progressive drifting in hemoglobin and reports of dark stools which are heme positive.  Concern is recurrent GI  bleeding.  Xarelto has been held since December 5.  We will plan upper endoscopy/enteroscopy tentatively for tomorrow.The nature of the procedure, as well as the risks, benefits, and alternatives were carefully and thoroughly reviewed with the patient. Ample time for discussion and questions allowed. The patient understood, was satisfied, and agreed to proceed.  The patient is high risk given his age, comorbidities, chronic anticoagulation needs.  Wilhemina Bonito. Eda Keys., M.D. [facility] Division of Gastroenterology

## 2023-05-18 NOTE — Progress Notes (Signed)
Mobility Specialist Progress Note:   05/18/23 0900  Mobility  Activity Transferred from bed to chair  Level of Assistance Standby assist, set-up cues, supervision of patient - no hands on  Assistive Device None  Distance Ambulated (ft) 3 ft  Activity Response Tolerated well  Mobility Referral Yes  Mobility visit 1 Mobility  Mobility Specialist Start Time (ACUTE ONLY) 0900  Mobility Specialist Stop Time (ACUTE ONLY) 0908  Mobility Specialist Time Calculation (min) (ACUTE ONLY) 8 min   Pt received in bed, agreeable to mobility session. Transferred B>C, no AD required. Tolerated well, SpO2 WFL. Left with all needs met, call bell in reach.    Feliciana Rossetti Mobility Specialist Please contact via Special educational needs teacher or  Rehab office at [phone]

## 2023-05-18 NOTE — Progress Notes (Signed)
17 Days Post-Op   Subjective/Chief Complaint: No complaints No nausea or vomiting BM yesterday Hgb stable   Objective: Vital signs in last 24 hours: Temp:  [98 F (36.7 C)-98.7 F (37.1 C)] 98 F (36.7 C) (12/07 0800) Pulse Rate:  [75-97] 89 (12/07 0816) Resp:  [14-20] 15 (12/07 0816) BP: (109-133)/(60-90) 111/69 (12/07 0800) SpO2:  [99 %-100 %] 99 % (12/07 0816) Last BM Date : 05/17/23  Intake/Output from previous day: 12/06 0701 - 12/07 0700 In: -  Out: 420 [Urine:400; Drains:20] Intake/Output this shift: No intake/output data recorded.  Gen:  Alert, NAD Abd: soft, mild distension, nontender, wound at umbilicus open and scabbing without erythema drainage. Left sided surgical drain scant serosanguinous fluid in bulb, IR transgluteal drain scant bloody fluid in bulb    Lab Results:  Recent Labs    05/17/23 0343 05/18/23 0356  WBC 9.6 9.3  HGB 7.4* 7.4*  HCT 25.8* 25.3*  PLT 571* 585*   BMET Recent Labs    05/16/23 0533 05/17/23 0343  NA 136 138  K 4.9 4.3  CL 90* 94*  CO2 40* 38*  GLUCOSE 137* 128*  BUN 15 16  CREATININE 0.91 0.81  CALCIUM 8.7* 8.7*     Anti-infectives: Anti-infectives (From admission, onward)    Start     Dose/Rate Route Frequency Ordered Stop   05/16/23 1200  amoxicillin-clavulanate (AUGMENTIN) 875-125 MG per tablet 1 tablet        1 tablet Oral Every 12 hours 05/16/23 1109     05/14/23 1800  Ampicillin-Sulbactam (UNASYN) 3 g in sodium chloride 0.9 % 100 mL IVPB  Status:  Discontinued        3 g 200 mL/hr over 30 Minutes Intravenous Every 6 hours 05/14/23 0957 05/16/23 1109   05/07/23 0945  piperacillin-tazobactam (ZOSYN) IVPB 3.375 g  Status:  Discontinued        3.375 g 12.5 mL/hr over 240 Minutes Intravenous Every 8 hours 05/07/23 0851 05/14/23 0957   05/07/23 0930  piperacillin-tazobactam (ZOSYN) IVPB 3.375 g  Status:  Discontinued        3.375 g 100 mL/hr over 30 Minutes Intravenous Every 8 hours 05/07/23 0836 05/07/23  0851   04/30/23 1930  piperacillin-tazobactam (ZOSYN) IVPB 3.375 g        3.375 g 12.5 mL/hr over 240 Minutes Intravenous Every 8 hours 04/30/23 1915 05/05/23 1940   04/30/23 1145  piperacillin-tazobactam (ZOSYN) IVPB 3.375 g        3.375 g 100 mL/hr over 30 Minutes Intravenous  Once 04/30/23 1131 04/30/23 1242       Assessment/Plan: S/p laparoscopic append for perforated appendicitis 11/20 - MW - s/p IR drain placement 11/24, culture E coli and Klebsiella pneumoniae.   - CT 12/2 with improving fluid collections, IR drain may be slightly retracted. Surgical drain near other fluid collection - S/p IR drain exchange 12/3 - continue IR and surgical drain, antibiotics. Plan to continue antibiotics until repeat imaging early next week given persistent fluid collections on last CT. Antibiotics narrowed to Augmentin - encourage PO intake, protein shakes - continue miralax/colace - oob/pulm toilet; encouraged ambulation in halls. Continue therapies - rec SNF - s/p 1u PRBCs 12/5 for Hgb 6.7, Hgb stable at 7.4. VSS. Occult blood pending. Discussed with primary team, plan to hold xarelto and ASA today and monitor   FEN: reg diet, Boost/Ensure ID: zosyn 11/19>>12/3, unasyn 12/3>> VTE: hold xarelto 12/6 due to anemia   AKI, creatinine improving - nephrology s/o Syncope COPD/Chronic  resp failure - on 2 lpm at baseline HTN Atrial flutter on xarelto - hold for anemia H/o CVA PVD Tobacco use    LOS: 18 days    Jared [name] 05/18/2023

## 2023-05-18 NOTE — Plan of Care (Signed)

## 2023-05-18 NOTE — Progress Notes (Addendum)
PROGRESS NOTE        PATIENT DETAILS Name: Jared [name] Age: 67 y.o. Sex: male Date of Birth: 13-Feb-1956 Admit Date: 04/30/2023 Admitting Physician Anselm Jungling, DO IRJ:JOACZY, Odette Horns, MD  Brief Summary: Patient is a 67 y.o.  male with history of COPD on 2 L of oxygen at home, A-fib, HTN, chronic combined systolic/diastolic CHF-who presented with abdominal/testicular pain-found to have sepsis due to acute appendicitis with perforation.  Significant events: 11/19>> admit to [facility] 11/20>> laparoscopic appendectomy 11/24>> transgluteal pelvic drain placement-respiratory distress postprocedure-transferred to ICU. 12/03>> drain injection/manipulation and drain exchange to place catheter in the abscess.  Significant studies: 11/19>> scrotal ultrasound/Doppler ultrasound of testicle: Unremarkable 11/19>> CT head: No acute findings 11/19>> CT abdomen/pelvis: Acute perforated appendicitis. 11/21>> VQ scan: PE absent 11/21>> echo: EF 55-60%, grade 2 diastolic dysfunction, RV systolic function moderately reduced, vague density in the IVC-cannot rule out thrombus 11/21>> IVC/iliac Doppler: Poor study 11/22>> bilateral lower extremity Doppler: No DVT 11/23>> CT abdomen/pelvis: Deep pelvic abscess 12/02>> CT abdomen/pelvis: Decreased volume of pelvic abscess-pigtail drainage catheter is noted along the right inferior margin of this fluid collection.  Significant microbiology data: 11/19>> COVID/influenza/RSV PCR: Negative 11/19>> blood culture: No growth 11/24>> pelvic abscess culture: E. coli/Klebsiella  Procedures: 11/24>> CT-guided transgluteal pelvic drain placement by IR 12/03>> Drain injection, drain manipulation and drain exchange   Consults: Cardiology General Surgery IR GI  Subjective: Lying comfortably in bed-some dark stools overnight-he is now not sure whether this is darker/black or dark green like before.  Objective: Vitals: Blood pressure  111/69, pulse 89, temperature 98 F (36.7 C), temperature source Oral, resp. rate 15, height 6\' 3"  (1.905 m), weight 110.1 kg, SpO2 99%.   Exam: Gen Exam:Alert awake-not in any distress HEENT:atraumatic, normocephalic Chest: B/L clear to auscultation anteriorly CVS:S1S2 regular Abdomen:soft non tender, non distended Extremities:no edema Neurology: Non focal Skin: no rash  Pertinent Labs/Radiology:    Latest Ref Rng & Units 05/18/2023    3:56 AM 05/17/2023    3:43 AM 05/16/2023    7:42 PM  CBC  WBC 4.0 - 10.5 K/uL 9.3  9.6    Hemoglobin 13.0 - 17.0 g/dL 7.4  7.4  7.5   Hematocrit 39.0 - 52.0 % 25.3  25.8  25.3   Platelets 150 - 400 K/uL 585  571      Lab Results  Component Value Date   NA 138 05/17/2023   K 4.3 05/17/2023   CL 94 (L) 05/17/2023   CO2 38 (H) 05/17/2023      Assessment/Plan: Sepsis due to acute appendicitis with perforation s/p laparoscopic appendectomy 11/20-with development of residual deep pelvic abscess requiring drain placement by IR on 11/24 and drain exchange on 12/3 Stable Continue to monitor drain output Discussed with general surgery team on 12/4-okay to switch to Augmentin-and to continue  for the next 2 weeks or so until patient gets a repeat CT abdomen.    Acute on chronic hypoxic respiratory failure Respiratory decompensation after lying prone for drain placement on 11/24-briefly required NRB-and close ICU monitoring. Currently back on usual home regimen of 2-3 L.  Normocytic anemia No hematochezia-but has been having "dark-colored stools" for the past several days  1 unit of PRBC transfusion 12/5-Hb low but stable/holding. Has prior history of AVMs Xarelto held 12/5 Given the fact that there is some diagnostic uncertainty-possibility of slow GI  bleed-need to resume long-term anticoagulation-GI consulted for possible endoscopy evaluation.  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  Atrial flutter Rate controlled Beta-blocker Xarelto on hold due to severity of anemia and concern for slow GI bleeding.  HTN Stable Metoprolol/Imdur/hydralazine  HLD Statin  PAD Statin Holding aspirin due to severity of anemia.  History of recent syncope Chest pain No chest pain this morning Cardiology planning outpatient monitor Telemetry monitoring in the interim.  Mood disorder Cymbalta/Neurontin  Tobacco abuse Transdermal nicotine  Debility/deconditioning PT/OT eval-SNF recommended  Nutrition Status: Nutrition Problem: Moderate Malnutrition Etiology: acute illness Signs/Symptoms: energy intake < 75% for > 7 days, mild muscle depletion, mild fat depletion Interventions: Refer to RD note for recommendations, MVI, Ensure Enlive (each supplement provides 350kcal and 20 grams of protein), Boost Breeze  Obesity: Estimated body mass index is 30.34 kg/m as calculated from the following:   Height as of this encounter: 6\' 3"  (1.905 m).   Weight as of this encounter: 110.1 kg.   Code status:   Code Status: Full Code   DVT Prophylaxis: SCDs Start: 04/30/23 1909     Family Communication: None at bedside   Disposition Plan: Status is: Inpatient Remains inpatient appropriate because: Severity of illness   Planned Discharge Destination:Skilled nursing facility   Diet: Diet Order             Diet regular Room service appropriate? Yes; Fluid consistency: Thin  Diet effective now                     Antimicrobial agents: Anti-infectives (From admission, onward)    Start     Dose/Rate Route Frequency Ordered Stop   05/16/23 1200   amoxicillin-clavulanate (AUGMENTIN) 875-125 MG per tablet 1 tablet        1 tablet Oral Every 12 hours 05/16/23 1109     05/14/23 1800  Ampicillin-Sulbactam (UNASYN) 3 g in sodium chloride 0.9 % 100 mL IVPB  Status:  Discontinued        3 g 200 mL/hr over 30 Minutes Intravenous Every 6 hours 05/14/23 0957 05/16/23 1109   05/07/23 0945  piperacillin-tazobactam (ZOSYN) IVPB 3.375 g  Status:  Discontinued        3.375 g 12.5 mL/hr over 240 Minutes Intravenous Every 8 hours 05/07/23 0851 05/14/23 0957   05/07/23 0930  piperacillin-tazobactam (ZOSYN) IVPB 3.375 g  Status:  Discontinued        3.375 g 100 mL/hr over 30 Minutes Intravenous Every 8 hours 05/07/23 0836 05/07/23 0851   04/30/23 1930  piperacillin-tazobactam (ZOSYN) IVPB 3.375 g        3.375 g 12.5 mL/hr over 240 Minutes Intravenous Every 8 hours 04/30/23 1915 05/05/23 1940   04/30/23 1145  piperacillin-tazobactam (ZOSYN) IVPB 3.375 g        3.375 g 100 mL/hr over 30 Minutes Intravenous  Once 04/30/23 1131 04/30/23 1242        MEDICATIONS: Scheduled Meds:  acetaminophen  650 mg Oral Q6H   amoxicillin-clavulanate  1 tablet Oral Q12H   arformoterol  15 mcg Nebulization BID   atorvastatin  80 mg Oral Daily   bisacodyl  10 mg Rectal Once   budesonide (PULMICORT) nebulizer solution  0.5 mg Nebulization  BID   Chlorhexidine Gluconate Cloth  6 each Topical Daily   docusate sodium  100 mg Oral BID   DULoxetine  60 mg Oral Daily   ezetimibe  10 mg Oral Daily   feeding supplement  237 mL Oral BID BM   ferrous sulfate  325 mg Oral BID WC   folic acid  1 mg Oral Daily   furosemide  40 mg Oral Daily   gabapentin  300 mg Oral BID   hydrALAZINE  50 mg Oral Q8H   isosorbide mononitrate  30 mg Oral Daily   metoprolol succinate  50 mg Oral Daily   multivitamin with minerals  1 tablet Oral Daily   nicotine  21 mg Transdermal Daily   pantoprazole  40 mg Oral BID   polyethylene glycol  17 g Oral BID   revefenacin  175 mcg Nebulization  Daily   sodium chloride flush  5 mL Intracatheter Q8H   Continuous Infusions:   PRN Meds:.albuterol, bisacodyl, haloperidol lactate, methocarbamol, morphine injection, oxyCODONE   I have personally reviewed following labs and imaging studies  LABORATORY DATA: CBC: Recent Labs  Lab 05/14/23 0443 05/15/23 0436 05/16/23 0533 05/16/23 1942 05/17/23 0343 05/18/23 0356  WBC 12.2* 10.2 8.8  --  9.6 9.3  HGB 7.4* 7.0* 6.7* 7.5* 7.4* 7.4*  HCT 26.3* 24.3* 23.7* 25.3* 25.8* 25.3*  MCV 92.0 93.1 92.6  --  89.9 91.7  PLT 470* 521* 549*  --  571* 585*    Basic Metabolic Panel: Recent Labs  Lab 05/13/23 0459 05/14/23 0443 05/15/23 0436 05/16/23 0533 05/17/23 0343  NA 141 138 136 136 138  K 4.7 4.6 5.0 4.9 4.3  CL 93* 94* 93* 90* 94*  CO2 40* 37* 39* 40* 38*  GLUCOSE 106* 143* 155* 137* 128*  BUN 12 11 16 15 16   CREATININE 1.02 0.75 0.86 0.91 0.81  CALCIUM 9.0 8.5* 8.5* 8.7* 8.7*  PHOS  --  3.5 3.0  --   --     GFR: Estimated Creatinine Clearance: 115.3 mL/min (by C-G formula based on SCr of 0.81 mg/dL).  Liver Function Tests: Recent Labs  Lab 05/16/23 0533 05/17/23 0343  AST 49* 69*  ALT 55* 74*  ALKPHOS 144* 151*  BILITOT 0.3 0.4  PROT 6.0* 6.1*  ALBUMIN 2.4* 2.5*   No results for input(s): "LIPASE", "AMYLASE" in the last 168 hours. No results for input(s): "AMMONIA" in the last 168 hours.  Coagulation Profile: No results for input(s): "INR", "PROTIME" in the last 168 hours.  Cardiac Enzymes: No results for input(s): "CKTOTAL", "CKMB", "CKMBINDEX", "TROPONINI" in the last 168 hours.  BNP (last 3 results) No results for input(s): "PROBNP" in the last 8760 hours.  Lipid Profile: No results for input(s): "CHOL", "HDL", "LDLCALC", "TRIG", "CHOLHDL", "LDLDIRECT" in the last 72 hours.  Thyroid Function Tests: No results for input(s): "TSH", "T4TOTAL", "FREET4", "T3FREE", "THYROIDAB" in the last 72 hours.  Anemia Panel: No results for input(s):  "VITAMINB12", "FOLATE", "FERRITIN", "TIBC", "IRON", "RETICCTPCT" in the last 72 hours.  Urine analysis:    Component Value Date/Time   COLORURINE YELLOW 04/30/2023 2155   APPEARANCEUR CLEAR 04/30/2023 2155   LABSPEC 1.028 04/30/2023 2155   PHURINE 5.0 04/30/2023 2155   GLUCOSEU NEGATIVE 04/30/2023 2155   HGBUR SMALL (A) 04/30/2023 2155   BILIRUBINUR NEGATIVE 04/30/2023 2155   BILIRUBINUR negative 09/20/2021 1620   KETONESUR NEGATIVE 04/30/2023 2155   PROTEINUR NEGATIVE 04/30/2023 2155   UROBILINOGEN 0.2 09/20/2021 1620   NITRITE NEGATIVE 04/30/2023  2155   LEUKOCYTESUR NEGATIVE 04/30/2023 2155    Sepsis Labs: Lactic Acid, Venous    Component Value Date/Time   LATICACIDVEN 1.1 05/03/2023 0657    MICROBIOLOGY: No results found for this or any previous visit (from the past 240 hour(s)).   RADIOLOGY STUDIES/RESULTS: No results found.   LOS: 18 days   Jeoffrey Massed, MD  [facility]    To contact the attending provider between 7A-7P or the covering provider during after hours 7P-7A, please log into the web site [email or web address] and access using universal [place] password for that web site. If you do not have the password, please call the hospital operator.  05/18/2023, 10:47 AM

## 2023-05-19 ENCOUNTER — Encounter (HOSPITAL_COMMUNITY)
Admission: EM | Disposition: A | Discharge status: Skilled Nursing Facility | Payer: Self-pay | Source: Home / Self Care | Attending: Internal Medicine

## 2023-05-19 DIAGNOSIS — K3532 Acute appendicitis with perforation and localized peritonitis, without abscess: Secondary | ICD-10-CM | POA: Diagnosis not present

## 2023-05-19 DIAGNOSIS — R195 Other fecal abnormalities: Secondary | ICD-10-CM | POA: Diagnosis not present

## 2023-05-19 DIAGNOSIS — D62 Acute posthemorrhagic anemia: Secondary | ICD-10-CM | POA: Diagnosis not present

## 2023-05-19 DIAGNOSIS — I10 Essential (primary) hypertension: Secondary | ICD-10-CM | POA: Diagnosis not present

## 2023-05-19 DIAGNOSIS — R55 Syncope and collapse: Secondary | ICD-10-CM | POA: Diagnosis not present

## 2023-05-19 DIAGNOSIS — I4729 Other ventricular tachycardia: Secondary | ICD-10-CM

## 2023-05-19 DIAGNOSIS — I480 Paroxysmal atrial fibrillation: Secondary | ICD-10-CM | POA: Diagnosis not present

## 2023-05-19 DIAGNOSIS — N179 Acute kidney failure, unspecified: Secondary | ICD-10-CM | POA: Diagnosis not present

## 2023-05-19 LAB — BASIC METABOLIC PANEL
Anion gap: 7 (ref 5–15)
BUN: 15 mg/dL (ref 8–23)
CO2: 38 mmol/L — ABNORMAL HIGH (ref 22–32)
Calcium: 8.6 mg/dL — ABNORMAL LOW (ref 8.9–10.3)
Chloride: 93 mmol/L — ABNORMAL LOW (ref 98–111)
Creatinine, Ser: 0.9 mg/dL (ref 0.61–1.24)
GFR, Estimated: >60 mL/min (ref >60)
Glucose, Bld: 148 mg/dL — ABNORMAL HIGH (ref 70–99)
Potassium: 5 mmol/L (ref 3.5–5.1)
Sodium: 138 mmol/L (ref 135–145)

## 2023-05-19 LAB — CBC
HCT: 27 % — ABNORMAL LOW (ref 39.0–52.0)
Hemoglobin: 7.8 g/dL — ABNORMAL LOW (ref 13.0–17.0)
MCH: 26.7 pg (ref 26.0–34.0)
MCHC: 28.9 g/dL — ABNORMAL LOW (ref 30.0–36.0)
MCV: 92.5 fL (ref 80.0–100.0)
Platelets: 593 10*3/uL — ABNORMAL HIGH (ref 150–400)
RBC: 2.92 MIL/uL — ABNORMAL LOW (ref 4.22–5.81)
RDW: 17.2 % — ABNORMAL HIGH (ref 11.5–15.5)
WBC: 8.7 10*3/uL (ref 4.0–10.5)
nRBC: 0 % (ref 0.0–0.2)

## 2023-05-19 LAB — GLUCOSE, CAPILLARY
Glucose-Capillary: 126 mg/dL — ABNORMAL HIGH (ref 70–99)
Glucose-Capillary: 101 mg/dL — ABNORMAL HIGH (ref 70–99)
Glucose-Capillary: 101 mg/dL — ABNORMAL HIGH (ref 70–99)

## 2023-05-19 LAB — MAGNESIUM: Magnesium: 2.2 mg/dL (ref 1.7–2.4)

## 2023-05-19 SURGERY — CANCELLED PROCEDURE

## 2023-05-19 MED ORDER — METOPROLOL SUCCINATE ER 50 MG PO TB24
75.0000 mg | ORAL_TABLET | Freq: Every day | ORAL | Status: DC
  Administered 2023-05-20 – 2023-05-21 (×2): 75 mg via ORAL
  Filled 2023-05-19 (×2): qty 1

## 2023-05-19 MED ORDER — METOPROLOL SUCCINATE ER 25 MG PO TB24
25.0000 mg | ORAL_TABLET | Freq: Once | ORAL | Status: AC
  Administered 2023-05-19: 25 mg via ORAL
  Filled 2023-05-19: qty 1

## 2023-05-19 NOTE — Progress Notes (Signed)
18 Days Post-Op   Subjective/Chief Complaint: No complaints No nausea or vomiting BM yesterday Hgb slightly increased from yesterday   Objective: Vital signs in last 24 hours: Temp:  [97.8 F (36.6 C)-98 F (36.7 C)] 98 F (36.7 C) (12/08 0801) Pulse Rate:  [80-83] 82 (12/08 0801) Resp:  [15-17] 17 (12/08 0801) BP: (115-129)/(67-102) 118/68 (12/08 0801) SpO2:  [99 %-100 %] 100 % (12/08 0801) Last BM Date : 05/18/23  Intake/Output from previous day: 12/07 0701 - 12/08 0700 In: -  Out: 866 [Urine:850; Drains:16] Intake/Output this shift: No intake/output data recorded.   Gen:  Alert, NAD Abd: soft, mild distension, nontender, wound at umbilicus open and scabbing without erythema drainage. Left sided surgical drain scant serosanguinous fluid in bulb, IR transgluteal drain scant bloody fluid in bulb    Lab Results:  Recent Labs    05/18/23 0356 05/19/23 0542  WBC 9.3 8.7  HGB 7.4* 7.8*  HCT 25.3* 27.0*  PLT 585* 593*   BMET Recent Labs    05/17/23 0343 05/19/23 0204  NA 138 138  K 4.3 5.0  CL 94* 93*  CO2 38* 38*  GLUCOSE 128* 148*  BUN 16 15  CREATININE 0.81 0.90  CALCIUM 8.7* 8.6*   PT/INR No results for input(s): "LABPROT", "INR" in the last 72 hours. ABG No results for input(s): "PHART", "HCO3" in the last 72 hours.  Invalid input(s): "PCO2", "PO2"  Studies/Results: No results found.  Anti-infectives: Anti-infectives (From admission, onward)    Start     Dose/Rate Route Frequency Ordered Stop   05/16/23 1200  amoxicillin-clavulanate (AUGMENTIN) 875-125 MG per tablet 1 tablet        1 tablet Oral Every 12 hours 05/16/23 1109     05/14/23 1800  Ampicillin-Sulbactam (UNASYN) 3 g in sodium chloride 0.9 % 100 mL IVPB  Status:  Discontinued        3 g 200 mL/hr over 30 Minutes Intravenous Every 6 hours 05/14/23 0957 05/16/23 1109   05/07/23 0945  piperacillin-tazobactam (ZOSYN) IVPB 3.375 g  Status:  Discontinued        3.375 g 12.5 mL/hr over  240 Minutes Intravenous Every 8 hours 05/07/23 0851 05/14/23 0957   05/07/23 0930  piperacillin-tazobactam (ZOSYN) IVPB 3.375 g  Status:  Discontinued        3.375 g 100 mL/hr over 30 Minutes Intravenous Every 8 hours 05/07/23 0836 05/07/23 0851   04/30/23 1930  piperacillin-tazobactam (ZOSYN) IVPB 3.375 g        3.375 g 12.5 mL/hr over 240 Minutes Intravenous Every 8 hours 04/30/23 1915 05/05/23 1940   04/30/23 1145  piperacillin-tazobactam (ZOSYN) IVPB 3.375 g        3.375 g 100 mL/hr over 30 Minutes Intravenous  Once 04/30/23 1131 04/30/23 1242       Assessment/Plan: S/p laparoscopic appendectomy for perforated appendicitis 11/20 - MW - s/p IR drain placement 11/24, culture E coli and Klebsiella pneumoniae.   - CT 12/2 with improving fluid collections, IR drain may be slightly retracted. Surgical drain near other fluid collection - S/p IR drain exchange 12/3 - continue IR and surgical drain, antibiotics. Plan to continue antibiotics until repeat imaging early next week given persistent fluid collections on last CT. Antibiotics narrowed to Augmentin - encourage PO intake, protein shakes - continue miralax/colace - oob/pulm toilet; encouraged ambulation in halls. Continue therapies - rec SNF - s/p 1u PRBCs 12/5 for Hgb 6.7, Hgb stable at 7.4. VSS. Occult blood pending. 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

## 2023-05-19 NOTE — Progress Notes (Signed)
Rounding Note    Patient Name: Jared [name] Date of Encounter: 05/19/2023  [facility] Cardiologist: Dietrich Pates, MD   Subjective   Pt feels breathing is at baseline, still w/ some pain from abd surgery, unchanged.   No palpitations, no dizziness, no presyncope or syncope  Inpatient Medications    Scheduled Meds:  acetaminophen  650 mg Oral Q6H   amoxicillin-clavulanate  1 tablet Oral Q12H   arformoterol  15 mcg Nebulization BID   atorvastatin  80 mg Oral Daily   bisacodyl  10 mg Rectal Once   budesonide (PULMICORT) nebulizer solution  0.5 mg Nebulization BID   Chlorhexidine Gluconate Cloth  6 each Topical Daily   docusate sodium  100 mg Oral BID   DULoxetine  60 mg Oral Daily   ezetimibe  10 mg Oral Daily   feeding supplement  237 mL Oral BID BM   ferrous sulfate  325 mg Oral BID WC   folic acid  1 mg Oral Daily   furosemide  40 mg Oral Daily   gabapentin  300 mg Oral BID   hydrALAZINE  50 mg Oral Q8H   isosorbide mononitrate  30 mg Oral Daily   metoprolol succinate  50 mg Oral Daily   multivitamin with minerals  1 tablet Oral Daily   nicotine  21 mg Transdermal Daily   pantoprazole  40 mg Oral BID   polyethylene glycol  17 g Oral BID   revefenacin  175 mcg Nebulization Daily   sodium chloride flush  5 mL Intracatheter Q8H   Continuous Infusions:  PRN Meds: albuterol, bisacodyl, haloperidol lactate, methocarbamol, morphine injection, oxyCODONE   Vital Signs    Vitals:   05/18/23 2203 05/18/23 2330 05/19/23 0400 05/19/23 0801  BP: (!) 129/102 126/72 127/69 118/68  Pulse:  82 83 82  Resp:  16 17 17   Temp:  97.8 F (36.6 C) 97.9 F (36.6 C) 98 F (36.7 C)  TempSrc:  Oral Oral Oral  SpO2:  100% 100% 100%  Weight:      Height:        Intake/Output Summary (Last 24 hours) at 05/19/2023 1120 Last data filed at 05/19/2023 0500 Gross per 24 hour  Intake --  Output 866 ml  Net -866 ml      05/15/2023    3:00 AM 05/14/2023    4:00 AM 05/12/2023     5:00 AM  Last 3 Weights  Weight (lbs) 242 lb 11.6 oz 231 lb 7.7 oz 215 lb 6.2 oz  Weight (kg) 110.1 kg 105 kg 97.7 kg      Telemetry    SR, frequent artifact but also has frequent PACs>>PVCs. There were 2 episodes of NSVT, about 12 hr apart and with different appearances  - Personally Reviewed  ECG    None today - Personally Reviewed  Physical Exam   GEN: No acute distress.   Neck: JVD 10 cm Cardiac: RRR, no murmurs, rubs, or gallops.  Respiratory: Essentially Clear to auscultation bilaterally. GI: Soft, nontender, non-distended  MS: No edema; No deformity. Neuro:  Nonfocal  Psych: Normal affect   Labs    High Sensitivity Troponin:   Recent Labs  Lab 04/30/23 1144 04/30/23 1317 05/02/23 1717 05/02/23 1718  TROPONINIHS 48* 36* 37* 40*     Chemistry Recent Labs  Lab 05/16/23 0533 05/17/23 0343 05/19/23 0204  NA 136 138 138  K 4.9 4.3 5.0  CL 90* 94* 93*  CO2 40* 38* 38*  GLUCOSE  137* 128* 148*  BUN 15 16 15   CREATININE 0.91 0.81 0.90  CALCIUM 8.7* 8.7* 8.6*  MG  --   --  2.2  PROT 6.0* 6.1*  --   ALBUMIN 2.4* 2.5*  --   AST 49* 69*  --   ALT 55* 74*  --   ALKPHOS 144* 151*  --   BILITOT 0.3 0.4  --   GFRNONAA >60 >60 >60  ANIONGAP 6 6 7     Lipids No results for input(s): "CHOL", "TRIG", "HDL", "LABVLDL", "LDLCALC", "CHOLHDL" in the last 168 hours.  Hematology Recent Labs  Lab 05/17/23 0343 05/18/23 0356 05/19/23 0542  WBC 9.6 9.3 8.7  RBC 2.87* 2.76* 2.92*  HGB 7.4* 7.4* 7.8*  HCT 25.8* 25.3* 27.0*  MCV 89.9 91.7 92.5  MCH 25.8* 26.8 26.7  MCHC 28.7* 29.2* 28.9*  RDW 18.2* 17.6* 17.2*  PLT 571* 585* 593*   Thyroid  Lab Results  Component Value Date   TSH 2.125 04/30/2023   BNP    Component Value Date/Time   BNP 96.5 05/12/2023 0625    Radiology    No results found.  Cardiac Studies   ECHO: 05/02/2023  1. Left ventricular ejection fraction, by estimation, is 55 to 60%. The left ventricle has normal function. The left  ventricle has no regional wall motion abnormalities. Left ventricular diastolic parameters are consistent with Grade II diastolic dysfunction (pseudonormalization).   2. Right ventricular systolic function is moderately reduced. The right ventricular size is moderately enlarged. Tricuspid regurgitation signal is inadequate for assessing PA pressure.   3. Left atrial size was mildly dilated.   4. The mitral valve is degenerative. No evidence of mitral valve  regurgitation. No evidence of mitral stenosis.   5. The aortic valve is tricuspid. Aortic valve regurgitation is not  visualized. Aortic valve sclerosis/calcification is present, without any evidence of aortic stenosis.   6. There is a vague density in the IVC. Cannot rule out thrombus.  Recommend dedicated Abdominal CTA vs. Abdominal US for further evaluation.  The inferior vena cava is dilated in size with >50% respiratory  variability, suggesting right atrial pressure of  8 mmHg.   7. In setting of syncope as well as RV dysfunction, consider acute PE.  Recommend Chest CTA if clinically indicated.   Patient Profile     67 y.o. male with hx of mild CAD (cath in 2012; normal myoview in 2016), HFrEF (LVEF 30 to 35% in 2018; 45 to 50% in 2019), HTN, PAD (s/p stent to L Fempop in June 2024),HL, paroxysmal atrial flutter (on Xarelto), CVA, COPD (on O2), was admitted 11/19 with anemia, acute perforated appendicitis with micorperforation.   S/p lap appy 11/20, course complicated by AKI, A fib RVR.    Assessment & Plan    1.Arrhythmias, PAF/Aflutter w/ RVR & NSVT - on Toprol XL 50 mg every day - was initially off Xarelto, but it was restarted 11/29-12/04, then d/c'd due to anemia  2. HFimpEF/RV dysfunction - - Echo 11/21 showed LVEF improved to 55-60% , no RWMA, grade II DD, RV mod reduced, RV mod enlarged, mild LAE. TR inadequate to measure PASP - history of severe COPD and restrictive lung disease, likely group III pulm HTN.  - VQ  negative this admission for PE. Consider outpatient sleep study  3. Chest pain -Cath in 2012, not in our system - No Ischemia on Myoview 2016 but EF 30% - some chest pain this admission in setting of wheezing, volume overload.  -  no recent chest pain   4. Syncope - Patient reports 2 years of intermittent generalized body shaking with syncope and loss of consciousness that are often positional when going from sitting to standing and therefore sound orthostatic  - 21 beat run of NSVT on 11/23  - 2 runs NSVT, different morphology w/in last 24 hr - will increase BB, may need to consider amiodarone - do not think this precludes him getting GI procedures - consider outpatient monitor     5. Perforated appendicitis/sepsis - per primary team - Went to IR for transgluteal pelvic drain placement, drain management by IR anemic and WBC still elevated On Zosyn for antibiotics  - 05/15/23 had catheter exchange for 10 [place] [employer]    6. PAD - has been on ASA, statin - still on statin, ASA held due to anemia   7. Anemia:   - Hb up to 7.8  this am, nadir 6.7 s/p transfusion - FOB +, iron levels have been up and down over the last months, he has gotten periodic IV Iron   - GI seeing, EGD and colon planned    For questions or updates, please contact [facility] Please consult [email or web address] for contact info under        Signed, Theodore Demark, PA-C  05/19/2023, 11:20 AM

## 2023-05-19 NOTE — Plan of Care (Signed)
  Problem: Education: Goal: Knowledge of General Education information will improve Description: Including pain rating scale, medication(s)/side effects and non-pharmacologic comfort measures Outcome: Progressing   Problem: Health Behavior/Discharge Planning: Goal: Ability to manage health-related needs will improve Outcome: Progressing   Problem: Clinical Measurements: Goal: Ability to maintain clinical measurements within normal limits will improve Outcome: Progressing Goal: Will remain free from infection Outcome: Progressing Goal: Diagnostic test results will improve Outcome: Progressing Goal: Respiratory complications will improve Outcome: Progressing   Problem: Nutrition: Goal: Adequate nutrition will be maintained Outcome: Progressing   Problem: Coping: Goal: Level of anxiety will decrease Outcome: Progressing   Problem: Elimination: Goal: Will not experience complications related to bowel motility Outcome: Progressing   Problem: Pain Management: Goal: General experience of comfort will improve Outcome: Progressing   Problem: Safety: Goal: Ability to remain free from injury will improve Outcome: Progressing

## 2023-05-19 NOTE — Progress Notes (Addendum)
[facility] Progress Note  CC:   Anemia, black stool   Subjective: Patient remains n.p.o. since midnight for enteroscopy scheduled today.  Patient is hungry and wishes to eat as soon as procedure completed.  Denies having any nausea or vomiting.  No abdominal pain.  No bloody or black stools overnight.  No chest pain or shortness of breath.  Patient had a brief run of nonsustained V. tach around 12:30 AM, evaluated by the hospitalist.  No further runs of V. tach since confirmed by his RN.  Objective:  Vital signs in last 24 hours: Temp:  [97.8 F (36.6 C)-98 F (36.7 C)] 98 F (36.7 C) (12/08 0801) Pulse Rate:  [80-83] 82 (12/08 0801) Resp:  [15-17] 17 (12/08 0801) BP: (115-129)/(67-102) 118/68 (12/08 0801) SpO2:  [99 %-100 %] 100 % (12/08 0801) Last BM Date : 05/18/23 General: Chronically ill-appearing 67 year old male in no acute distress. Heart: Regular rhythm, no murmurs. Pulm: Breath sounds diminished throughout few bibasilar crackles.no wheezes or rhonchi.  On oxygen 2 L nasal cannula.  Abdomen: Soft, very mild tenderness to the RUQ without rebound or guarding. RLQ nontender. LLQ JP drain with a small amount of serosanguineous drainage.  Extremities: No edema. Neurologic:  Alert and  oriented x 4.  Speech is clear.  Moves all extremities. Psych:  Alert and cooperative. Normal mood and affect.  Intake/Output from previous day: 12/07 0701 - 12/08 0700 In: -  Out: 866 [Urine:850; Drains:16] Intake/Output this shift: No intake/output data recorded.  Lab Results: Recent Labs    05/17/23 0343 05/18/23 0356 05/19/23 0542  WBC 9.6 9.3 8.7  HGB 7.4* 7.4* 7.8*  HCT 25.8* 25.3* 27.0*  PLT 571* 585* 593*   BMET Recent Labs    05/17/23 0343 05/19/23 0204  NA 138 138  K 4.3 5.0  CL 94* 93*  CO2 38* 38*  GLUCOSE 128* 148*  BUN 16 15  CREATININE 0.81 0.90  CALCIUM 8.7* 8.6*   LFT Recent Labs    05/17/23 0343  PROT 6.1*  ALBUMIN 2.5*  AST 69*   ALT 74*  ALKPHOS 151*  BILITOT 0.4   PT/INR No results for input(s): "LABPROT", "INR" in the last 72 hours. Hepatitis Panel No results for input(s): "HEPBSAG", "HCVAB", "HEPAIGM", "HEPBIGM" in the last 72 hours.  No results found.  Assessment / Plan:  67 year old male admitted to the hospital 04/30/2023 with abdominal pain secondary to acute appendicitis with perforation and sepsis. S/P laparoscopic appendectomy 05/01/2023 with post op development of residual deep pelvic abscess requiring drain placement by IR on 11/24 and drain exchange on 12/3.  On Augmentin.   History of PUD, H. pylori gastritis and numerous hospital admissions with recurrent GI bleed secondary to gastric ulcers and small bowel AVMs.  Admission Hg 10.1 -> 9.6 ->8.2 -> 7s and down to 6.7 on 05/16/2023.  He was transfused 1 unit of PRBCs 12/5.  Posttransfusion hemoglobin 7.5 -> 7.4 -> today Hg 7.8. FOBT positive. Patient endorses chronically passing dark green or black stools.  No bright red blood per the rectum.  His most recent small bowel enteroscopy 03/2022 showed 3 nonbleeding AVMs in the duodenum treated with APC.  Normal EGD 12/2022, endo clip found in the stomach.  Patient had run of nonsustained V. tach last night.  Normal potassium and magnesium levels.  Currently remains hemodynamically stable. -Continue Pantoprazole 40 mg p.o. twice daily -CBC in a.m. -NPO  -Small bowel enteroscopy scheduled today. Enteroscopy on hold due to run  of nonsustained V-tach early am today. Cardiac clearance required prior to proceeding with small bowel enteroscopy. Theodore Demark cardiology PA-C contacted.  -Transfuse for hemoglobin less than 7.5 as needed if symptomatic   Acute on chronic iron deficiency anemia secondary to chronic GI blood loss.  On ferrous sulfate 325 mg twice daily at home and during this hospitalization.  Receives IV iron as needed as an outpatient per hematology. -See plan above   Paroxysmal atrial fibrillation,  Xarelto on hold since 12/5.  Nonsustained V. tach after midnight without recurrence.  Normal potassium and magnesium level.  On Toprol XL po.    Combined systolic and diastolic CHF.  LVEF 55 to 60%.   History of COPD, acute on chronic hypoxic respiratory failure on home oxygen  Thrombocytosis.  Platelet count 593.     Principal Problem:   Acute perforated appendicitis Active Problems:   Essential hypertension   Atrial flutter (HCC)   Chronic respiratory failure with hypoxia (HCC)   Tobacco use   AKI (acute kidney injury) (HCC)   Syncope and collapse   Peripheral vascular disease (HCC)   PAF (paroxysmal atrial fibrillation) (HCC)   Sepsis (HCC)   History of CVA (cerebrovascular accident)   Malnutrition of moderate degree   Abnormal echocardiogram   Atrial fibrillation with RVR (HCC)   HFrEF (heart failure with reduced ejection fraction) (HCC)   Heart failure with improved ejection fraction (HFimpEF) (HCC)   Melena   Chronic anticoagulation   Acute blood loss anemia     LOS: 19 days   Colleen M Kennedy-Smith  05/19/2023, 11:12 AM  GI ATTENDING  Interval history and data reviewed.  Agree with interval progress note as outlined above.  The patient was planned for enteroscopy today but developed nonsustained VT  last night.  Cardiology has kindly agreed to reevaluate and provide clearance for endoscopic procedure, when appropriate.  We have canceled the case for today and will reschedule when appropriate.  Patient aware.  He is clinically stable from GI perspective.  Hemoglobin unchanged from yesterday.  No reports of blood per rectum currently.  Remains off anticoagulation.    Wilhemina Bonito. Eda Keys., M.D. [facility] Division of Gastroenterology

## 2023-05-19 NOTE — H&P (View-Only) (Signed)
Jared [name]  CC:   Anemia, black stool   Subjective: Patient remains n.p.o. since midnight for enteroscopy scheduled today.  Patient is hungry and wishes to eat as soon as procedure completed.  Denies having any nausea or vomiting.  No abdominal pain.  No bloody or black stools overnight.  No chest pain or shortness of breath.  Patient had a brief run of nonsustained V. tach around 12:30 AM, evaluated by the hospitalist.  No further runs of V. tach since confirmed by his RN.  Objective:  Vital signs in last 24 hours: Temp:  [97.8 F (36.6 C)-98 F (36.7 C)] 98 F (36.7 C) (12/08 0801) Pulse Rate:  [80-83] 82 (12/08 0801) Resp:  [15-17] 17 (12/08 0801) BP: (115-129)/(67-102) 118/68 (12/08 0801) SpO2:  [99 %-100 %] 100 % (12/08 0801) Last BM Date : 05/18/23 General: Chronically ill-appearing 67 year old male in no acute distress. Heart: Regular rhythm, no murmurs. Pulm: Breath sounds diminished throughout few bibasilar crackles.no wheezes or rhonchi.  On oxygen 2 L nasal cannula.  Abdomen: Soft, very mild tenderness to the RUQ without rebound or guarding. RLQ nontender. LLQ JP drain with a small amount of serosanguineous drainage.  Extremities: No edema. Neurologic:  Alert and  oriented x 4.  Speech is clear.  Moves all extremities. Psych:  Alert and cooperative. Normal mood and affect.  Intake/Output from previous day: 12/07 0701 - 12/08 0700 In: -  Out: 866 [Urine:850; Drains:16] Intake/Output this shift: No intake/output data recorded.  Lab Results: Recent Labs    05/17/23 0343 05/18/23 0356 05/19/23 0542  WBC 9.6 9.3 8.7  HGB 7.4* 7.4* 7.8*  HCT 25.8* 25.3* 27.0*  PLT 571* 585* 593*   BMET Recent Labs    05/17/23 0343 05/19/23 0204  NA 138 138  K 4.3 5.0  CL 94* 93*  CO2 38* 38*  GLUCOSE 128* 148*  BUN 16 15  CREATININE 0.81 0.90  CALCIUM 8.7* 8.6*   LFT Recent Labs    05/17/23 0343  PROT 6.1*  ALBUMIN 2.5*  AST 69*   ALT 74*  ALKPHOS 151*  BILITOT 0.4   PT/INR No results for input(s): "LABPROT", "INR" in the last 72 hours. Hepatitis Panel No results for input(s): "HEPBSAG", "HCVAB", "HEPAIGM", "HEPBIGM" in the last 72 hours.  No results found.  Assessment / Plan:  67 year old male admitted to the hospital 04/30/2023 with abdominal pain secondary to acute appendicitis with perforation and sepsis. S/P laparoscopic appendectomy 05/01/2023 with post op development of residual deep pelvic abscess requiring drain placement by IR on 11/24 and drain exchange on 12/3.  On Augmentin.   History of PUD, H. pylori gastritis and numerous hospital admissions with recurrent GI bleed secondary to gastric ulcers and small bowel AVMs.  Admission Hg 10.1 -> 9.6 ->8.2 -> 7s and down to 6.7 on 05/16/2023.  He was transfused 1 unit of PRBCs 12/5.  Posttransfusion hemoglobin 7.5 -> 7.4 -> today Hg 7.8. FOBT positive. Patient endorses chronically passing dark green or black stools.  No bright red blood per the rectum.  His most recent small bowel enteroscopy 03/2022 showed 3 nonbleeding AVMs in the duodenum treated with APC.  Normal EGD 12/2022, endo clip found in the stomach.  Patient had run of nonsustained V. tach last night.  Normal potassium and magnesium levels.  Currently remains hemodynamically stable. -Continue Pantoprazole 40 mg p.o. twice daily -CBC in a.m. -NPO  -Small bowel enteroscopy scheduled today. Enteroscopy on hold due to run  of nonsustained V-tach early am today. Cardiac clearance required prior to proceeding with small bowel enteroscopy. Jared [name] cardiology PA-C contacted.  -Transfuse for hemoglobin less than 7.5 as needed if symptomatic   Acute on chronic iron deficiency anemia secondary to chronic GI blood loss.  On ferrous sulfate 325 mg twice daily at home and during this hospitalization.  Receives IV iron as needed as an outpatient per hematology. -See plan above   Paroxysmal atrial fibrillation,  Xarelto on hold since 12/5.  Nonsustained V. tach after midnight without recurrence.  Normal potassium and magnesium level.  On Toprol XL po.    Combined systolic and diastolic CHF.  LVEF 55 to 60%.   History of COPD, acute on chronic hypoxic respiratory failure on home oxygen  Thrombocytosis.  Platelet count 593.     Principal Problem:   Acute perforated appendicitis Active Problems:   Essential hypertension   Atrial flutter (HCC)   Chronic respiratory failure with hypoxia (HCC)   Tobacco use   AKI (acute kidney injury) (HCC)   Syncope and collapse   Peripheral vascular disease (HCC)   PAF (paroxysmal atrial fibrillation) (HCC)   Sepsis (HCC)   History of CVA (cerebrovascular accident)   Malnutrition of moderate degree   Abnormal echocardiogram   Atrial fibrillation with RVR (HCC)   HFrEF (heart failure with reduced ejection fraction) (HCC)   Heart failure with improved ejection fraction (HFimpEF) (HCC)   Melena   Chronic anticoagulation   Acute blood loss anemia     LOS: 19 days   Colleen M Kennedy-Smith  05/19/2023, 11:12 AM  GI ATTENDING  Interval history and data reviewed.  Agree with interval progress [name] as outlined above.  The patient was planned for enteroscopy today but developed nonsustained VT  last night.  Cardiology has kindly agreed to reevaluate and provide clearance for endoscopic procedure, when appropriate.  We have canceled the case for today and will reschedule when appropriate.  Patient aware.  He is clinically stable from GI perspective.  Hemoglobin unchanged from yesterday.  No reports of blood per rectum currently.  Remains off anticoagulation.    Wilhemina Bonito. Eda Keys., M.D. [facility] Division of Gastroenterology

## 2023-05-19 NOTE — Plan of Care (Signed)
  Problem: Education: Goal: Knowledge of General Education information will improve Description: Including pain rating scale, medication(s)/side effects and non-pharmacologic comfort measures Outcome: Progressing   Problem: Health Behavior/Discharge Planning: Goal: Ability to manage health-related needs will improve Outcome: Progressing   Problem: Clinical Measurements: Goal: Ability to maintain clinical measurements within normal limits will improve Outcome: Progressing Goal: Will remain free from infection Outcome: Progressing Goal: Diagnostic test results will improve Outcome: Progressing Goal: Respiratory complications will improve Outcome: Progressing Goal: Cardiovascular complication will be avoided Outcome: Progressing   Problem: Activity: Goal: Risk for activity intolerance will decrease Outcome: Progressing   Problem: Nutrition: Goal: Adequate nutrition will be maintained Outcome: Progressing   Problem: Coping: Goal: Level of anxiety will decrease Outcome: Progressing   Problem: Elimination: Goal: Will not experience complications related to urinary retention Outcome: Progressing   Problem: Pain Management: Goal: General experience of comfort will improve Outcome: Progressing

## 2023-05-19 NOTE — Plan of Care (Signed)

## 2023-05-19 NOTE — Progress Notes (Signed)
Unsustained V. Tach: Patient nurse reported that patient has nonsustained V. tach heart rate 116, blood pressure 122/70, O2 sat 100% on 3 L and respiratory rate 16.  Per chart review patient has history of chronic CHF and atrial flutter on beta-blocker.  Currently on Toprol-XL 50 mg daily.  Xarelto on hold due to persistent low hemoglobin in between 6.7-7.4.  -Checking stat CBC, BMP and mag level. - If patient develops sustained ventricular tachycardia will obtain stat EKG and possibly treat with amnio bolus followed by amiodarone drip as well. -Continue Toprol-XL 50 mg daily - Ordered Lopressor 2.5 mg as needed for heart rate above 120.

## 2023-05-19 NOTE — Progress Notes (Signed)
PROGRESS NOTE        PATIENT DETAILS Name: Jared [name] Age: 67 y.o. Sex: male Date of Birth: 04-14-1956 Admit Date: 04/30/2023 Admitting Physician Anselm Jungling, DO JXB:JYNWGN, Odette Horns, MD  Brief Summary: Patient is a 67 y.o.  male with history of COPD on 2 L of oxygen at home, A-fib, HTN, chronic combined systolic/diastolic CHF-who presented with abdominal/testicular pain-found to have sepsis due to acute appendicitis with perforation.  Significant events: 11/19>> admit to [facility] 11/20>> laparoscopic appendectomy 11/24>> transgluteal pelvic drain placement-respiratory distress postprocedure-transferred to ICU. 12/03>> drain injection/manipulation and drain exchange to place catheter in the abscess.  Significant studies: 11/19>> scrotal ultrasound/Doppler ultrasound of testicle: Unremarkable 11/19>> CT head: No acute findings 11/19>> CT abdomen/pelvis: Acute perforated appendicitis. 11/21>> VQ scan: PE absent 11/21>> echo: EF 55-60%, grade 2 diastolic dysfunction, RV systolic function moderately reduced, vague density in the IVC-cannot rule out thrombus 11/21>> IVC/iliac Doppler: Poor study 11/22>> bilateral lower extremity Doppler: No DVT 11/23>> CT abdomen/pelvis: Deep pelvic abscess 12/02>> CT abdomen/pelvis: Decreased volume of pelvic abscess-pigtail drainage catheter is noted along the right inferior margin of this fluid collection.  Significant microbiology data: 11/19>> COVID/influenza/RSV PCR: Negative 11/19>> blood culture: No growth 11/24>> pelvic abscess culture: E. coli/Klebsiella  Procedures: 11/24>> CT-guided transgluteal pelvic drain placement by IR 12/03>> Drain injection, drain manipulation and drain exchange   Consults: Cardiology General Surgery IR GI  Subjective: No complaints-lying comfortably in bed.  Objective: Vitals: Blood pressure 118/68, pulse 82, temperature 98 F (36.7 C), temperature source Oral, resp. rate 17,  height 6\' 3"  (1.905 m), weight 110.1 kg, SpO2 100%.   Exam: Gen Exam:Alert awake-not in any distress HEENT:atraumatic, normocephalic Chest: B/L clear to auscultation anteriorly CVS:S1S2 regular Abdomen:soft non tender, non distended Extremities:no edema Neurology: Non focal Skin: no rash  Pertinent Labs/Radiology:    Latest Ref Rng & Units 05/19/2023    5:42 AM 05/18/2023    3:56 AM 05/17/2023    3:43 AM  CBC  WBC 4.0 - 10.5 K/uL 8.7  9.3  9.6   Hemoglobin 13.0 - 17.0 g/dL 7.8  7.4  7.4   Hematocrit 39.0 - 52.0 % 27.0  25.3  25.8   Platelets 150 - 400 K/uL 593  585  571     Lab Results  Component Value Date   NA 138 05/19/2023   K 5.0 05/19/2023   CL 93 (L) 05/19/2023   CO2 38 (H) 05/19/2023      Assessment/Plan: Sepsis due to acute appendicitis with perforation s/p laparoscopic appendectomy 11/20-with development of residual deep pelvic abscess requiring drain placement by IR on 11/24 and drain exchange on 12/3 Stable Continue to monitor drain output Discussed with general surgery team on 12/4-okay to switch to Augmentin-and to continue  for the next 2 weeks or so until patient gets a repeat CT abdomen.    Acute on chronic hypoxic respiratory failure Respiratory decompensation after lying prone for drain placement on 11/24-briefly required NRB-and close ICU monitoring. Currently back on usual home regimen of 2-3 L.  Normocytic anemia No hematochezia-but has been having "dark-colored stools" for the past several days  1 unit of PRBC transfusion 12/5-Hb low but stable/holding. Has prior history of AVMs Xarelto held 12/5 GI consulted 12/7-4 plans for EGD later today once cardiology has seen the patient.    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  Atrial flutter Rate controlled Beta-blocker Xarelto on hold due to severity of anemia and concern for slow GI bleeding.  HTN Stable Metoprolol/Imdur/hydralazine  HLD Statin  PAD Statin Holding aspirin due to severity of anemia.  History of recent syncope Chest pain No chest pain this morning Cardiology planning outpatient monitor Telemetry monitoring in the interim.  Mood disorder Cymbalta/Neurontin  Tobacco abuse Transdermal nicotine  Debility/deconditioning PT/OT eval-SNF recommended  Nutrition Status: Nutrition Problem: Moderate Malnutrition Etiology: acute illness Signs/Symptoms: energy intake < 75% for > 7 days, mild muscle depletion, mild fat depletion Interventions: Refer to RD note for recommendations, MVI, Ensure Enlive (each supplement provides 350kcal and 20 grams of protein), Boost Breeze  Obesity: Estimated body mass index is 30.34 kg/m as calculated from the following:   Height as of this encounter: 6\' 3"  (1.905 m).   Weight as of this encounter: 110.1 kg.   Code status:   Code Status: Full Code   DVT Prophylaxis: SCDs Start: 04/30/23 1909     Family Communication: None at bedside   Disposition Plan: Status is: Inpatient Remains inpatient appropriate because: Severity of illness   Planned Discharge Destination:Skilled nursing facility   Diet: Diet Order             Diet NPO time specified  Diet effective midnight                     Antimicrobial agents: Anti-infectives (From admission, onward)    Start     Dose/Rate Route Frequency Ordered Stop   05/16/23 1200  amoxicillin-clavulanate (AUGMENTIN) 875-125 MG per tablet 1 tablet        1 tablet Oral Every 12 hours 05/16/23 1109     05/14/23 1800  Ampicillin-Sulbactam (UNASYN) 3 g in sodium chloride 0.9 % 100 mL IVPB  Status:   Discontinued        3 g 200 mL/hr over 30 Minutes Intravenous Every 6 hours 05/14/23 0957 05/16/23 1109   05/07/23 0945  piperacillin-tazobactam (ZOSYN) IVPB 3.375 g  Status:  Discontinued        3.375 g 12.5 mL/hr over 240 Minutes Intravenous Every 8 hours 05/07/23 0851 05/14/23 0957   05/07/23 0930  piperacillin-tazobactam (ZOSYN) IVPB 3.375 g  Status:  Discontinued        3.375 g 100 mL/hr over 30 Minutes Intravenous Every 8 hours 05/07/23 0836 05/07/23 0851   04/30/23 1930  piperacillin-tazobactam (ZOSYN) IVPB 3.375 g        3.375 g 12.5 mL/hr over 240 Minutes Intravenous Every 8 hours 04/30/23 1915 05/05/23 1940   04/30/23 1145  piperacillin-tazobactam (ZOSYN) IVPB 3.375 g        3.375 g 100 mL/hr over 30 Minutes Intravenous  Once 04/30/23 1131 04/30/23 1242        MEDICATIONS: Scheduled Meds:  acetaminophen  650 mg Oral Q6H   amoxicillin-clavulanate  1 tablet Oral Q12H   arformoterol  15 mcg Nebulization BID   atorvastatin  80 mg Oral Daily   bisacodyl  10 mg Rectal Once   budesonide (PULMICORT) nebulizer solution  0.5 mg Nebulization BID   Chlorhexidine Gluconate Cloth  6 each Topical Daily   docusate sodium  100 mg Oral BID   DULoxetine  60 mg Oral Daily   ezetimibe  10 mg Oral Daily   feeding supplement  237 mL Oral BID BM   ferrous sulfate  325 mg Oral BID WC   folic acid  1 mg Oral Daily   furosemide  40 mg Oral Daily   gabapentin  300 mg Oral BID   hydrALAZINE  50 mg Oral Q8H   isosorbide mononitrate  30 mg Oral Daily   metoprolol succinate  50 mg Oral Daily   multivitamin with minerals  1 tablet Oral Daily   nicotine  21 mg Transdermal Daily   pantoprazole  40 mg Oral BID   polyethylene glycol  17 g Oral BID   revefenacin  175 mcg Nebulization Daily   sodium chloride flush  5 mL Intracatheter Q8H   Continuous Infusions:   PRN Meds:.albuterol, bisacodyl, haloperidol lactate, methocarbamol, morphine injection, oxyCODONE   I have personally reviewed  following labs and imaging studies  LABORATORY DATA: CBC: Recent Labs  Lab 05/15/23 0436 05/16/23 0533 05/16/23 1942 05/17/23 0343 05/18/23 0356 05/19/23 0542  WBC 10.2 8.8  --  9.6 9.3 8.7  HGB 7.0* 6.7* 7.5* 7.4* 7.4* 7.8*  HCT 24.3* 23.7* 25.3* 25.8* 25.3* 27.0*  MCV 93.1 92.6  --  89.9 91.7 92.5  PLT 521* 549*  --  571* 585* 593*    Basic Metabolic Panel: Recent Labs  Lab 05/14/23 0443 05/15/23 0436 05/16/23 0533 05/17/23 0343 05/19/23 0204  NA 138 136 136 138 138  K 4.6 5.0 4.9 4.3 5.0  CL 94* 93* 90* 94* 93*  CO2 37* 39* 40* 38* 38*  GLUCOSE 143* 155* 137* 128* 148*  BUN 11 16 15 16 15   CREATININE 0.75 0.86 0.91 0.81 0.90  CALCIUM 8.5* 8.5* 8.7* 8.7* 8.6*  MG  --   --   --   --  2.2  PHOS 3.5 3.0  --   --   --     GFR: Estimated Creatinine Clearance: 103.8 mL/min (by C-G formula based on SCr of 0.9 mg/dL).  Liver Function Tests: Recent Labs  Lab 05/16/23 0533 05/17/23 0343  AST 49* 69*  ALT 55* 74*  ALKPHOS 144* 151*  BILITOT 0.3 0.4  PROT 6.0* 6.1*  ALBUMIN 2.4* 2.5*   No results for input(s): "LIPASE", "AMYLASE" in the last 168 hours. No results for input(s): "AMMONIA" in the last 168 hours.  Coagulation Profile: No results for input(s): "INR", "PROTIME" in the last 168 hours.  Cardiac Enzymes: No results for input(s): "CKTOTAL", "CKMB", "CKMBINDEX", "TROPONINI" in the last 168 hours.  BNP (last 3 results) No results for input(s): "PROBNP" in the last 8760 hours.  Lipid Profile: No results for input(s): "CHOL", "HDL", "LDLCALC", "TRIG", "CHOLHDL", "LDLDIRECT" in the last 72 hours.  Thyroid Function Tests: No results for input(s): "TSH", "T4TOTAL", "FREET4", "T3FREE", "THYROIDAB" in the last 72 hours.  Anemia Panel: No results for input(s): "VITAMINB12", "FOLATE", "FERRITIN", "TIBC", "IRON", "RETICCTPCT" in the last 72 hours.  Urine analysis:    Component Value Date/Time   COLORURINE YELLOW 04/30/2023 2155   APPEARANCEUR CLEAR  04/30/2023 2155   LABSPEC 1.028 04/30/2023 2155   PHURINE 5.0 04/30/2023 2155   GLUCOSEU NEGATIVE 04/30/2023 2155   HGBUR SMALL (A) 04/30/2023 2155   BILIRUBINUR NEGATIVE 04/30/2023 2155   BILIRUBINUR negative 09/20/2021 1620   KETONESUR NEGATIVE 04/30/2023 2155   PROTEINUR NEGATIVE 04/30/2023 2155   UROBILINOGEN 0.2 09/20/2021 1620   NITRITE NEGATIVE 04/30/2023 2155   LEUKOCYTESUR NEGATIVE 04/30/2023 2155  Sepsis Labs: Lactic Acid, Venous    Component Value Date/Time   LATICACIDVEN 1.1 05/03/2023 0657    MICROBIOLOGY: No results found for this or any previous visit (from the past 240 hour(s)).   RADIOLOGY STUDIES/RESULTS: No results found.   LOS: 19 days   Jeoffrey Massed, MD  [facility]    To contact the attending provider between 7A-7P or the covering provider during after hours 7P-7A, please log into the web site [email or web address] and access using universal [place] password for that web site. If you do not have the password, please call the hospital operator.  05/19/2023, 11:20 AM

## 2023-05-20 ENCOUNTER — Encounter (HOSPITAL_COMMUNITY)
Admission: EM | Disposition: A | Discharge status: Skilled Nursing Facility | Payer: Self-pay | Source: Home / Self Care | Attending: Internal Medicine

## 2023-05-20 ENCOUNTER — Inpatient Hospital Stay (HOSPITAL_COMMUNITY): Payer: Medicare HMO | Admitting: Anesthesiology

## 2023-05-20 ENCOUNTER — Inpatient Hospital Stay: Payer: Medicare HMO | Attending: Nurse Practitioner

## 2023-05-20 ENCOUNTER — Inpatient Hospital Stay (HOSPITAL_COMMUNITY): Payer: Medicare HMO

## 2023-05-20 DIAGNOSIS — N179 Acute kidney failure, unspecified: Secondary | ICD-10-CM | POA: Diagnosis not present

## 2023-05-20 DIAGNOSIS — D500 Iron deficiency anemia secondary to blood loss (chronic): Secondary | ICD-10-CM

## 2023-05-20 DIAGNOSIS — K31819 Angiodysplasia of stomach and duodenum without bleeding: Secondary | ICD-10-CM

## 2023-05-20 DIAGNOSIS — K5520 Angiodysplasia of colon without hemorrhage: Secondary | ICD-10-CM

## 2023-05-20 DIAGNOSIS — I10 Essential (primary) hypertension: Secondary | ICD-10-CM | POA: Diagnosis not present

## 2023-05-20 DIAGNOSIS — K3532 Acute appendicitis with perforation and localized peritonitis, without abscess: Secondary | ICD-10-CM | POA: Diagnosis not present

## 2023-05-20 DIAGNOSIS — R55 Syncope and collapse: Secondary | ICD-10-CM | POA: Diagnosis not present

## 2023-05-20 HISTORY — PX: HOT HEMOSTASIS: SHX5433

## 2023-05-20 HISTORY — PX: ENTEROSCOPY: SHX5533

## 2023-05-20 HISTORY — PX: SUBMUCOSAL TATTOO INJECTION: SHX6856

## 2023-05-20 LAB — CBC
HCT: 26.1 % — ABNORMAL LOW (ref 39.0–52.0)
Hemoglobin: 7.4 g/dL — ABNORMAL LOW (ref 13.0–17.0)
MCH: 26.3 pg (ref 26.0–34.0)
MCHC: 28.4 g/dL — ABNORMAL LOW (ref 30.0–36.0)
MCV: 92.9 fL (ref 80.0–100.0)
Platelets: 568 10*3/uL — ABNORMAL HIGH (ref 150–400)
RBC: 2.81 MIL/uL — ABNORMAL LOW (ref 4.22–5.81)
RDW: 17.2 % — ABNORMAL HIGH (ref 11.5–15.5)
WBC: 8.8 10*3/uL (ref 4.0–10.5)
nRBC: 0 % (ref 0.0–0.2)

## 2023-05-20 LAB — GLUCOSE, CAPILLARY
Glucose-Capillary: 82 mg/dL (ref 70–99)
Glucose-Capillary: 124 mg/dL — ABNORMAL HIGH (ref 70–99)
Glucose-Capillary: 168 mg/dL — ABNORMAL HIGH (ref 70–99)
Glucose-Capillary: 164 mg/dL — ABNORMAL HIGH (ref 70–99)
Glucose-Capillary: 132 mg/dL — ABNORMAL HIGH (ref 70–99)

## 2023-05-20 LAB — BPAM RBC
Blood Product Expiration Date: 202412262359
Blood Product Expiration Date: 202412262359
Blood Product Expiration Date: 202412272359
Blood Product Expiration Date: 202412272359
Blood Product Expiration Date: 202412272359
ISSUE DATE / TIME: 202412051032
ISSUE DATE / TIME: 202412051146
ISSUE DATE / TIME: 202412051237
ISSUE DATE / TIME: 202412051506
Unit Type and Rh: 5100
Unit Type and Rh: 5100
Unit Type and Rh: 5100
Unit Type and Rh: 5100
Unit Type and Rh: 5100

## 2023-05-20 LAB — TYPE AND SCREEN
ABO/RH(D): O POS
Antibody Screen: NEGATIVE
Unit division: 0
Unit division: 0
Unit division: 0
Unit division: 0
Unit division: 0

## 2023-05-20 LAB — RENAL FUNCTION PANEL
Albumin: 2.6 g/dL — ABNORMAL LOW (ref 3.5–5.0)
Anion gap: 7 (ref 5–15)
BUN: 13 mg/dL (ref 8–23)
CO2: 37 mmol/L — ABNORMAL HIGH (ref 22–32)
Calcium: 8.6 mg/dL — ABNORMAL LOW (ref 8.9–10.3)
Chloride: 95 mmol/L — ABNORMAL LOW (ref 98–111)
Creatinine, Ser: 0.81 mg/dL (ref 0.61–1.24)
GFR, Estimated: >60 mL/min (ref >60)
Glucose, Bld: 156 mg/dL — ABNORMAL HIGH (ref 70–99)
Phosphorus: 3 mg/dL (ref 2.5–4.6)
Potassium: 4.3 mmol/L (ref 3.5–5.1)
Sodium: 139 mmol/L (ref 135–145)

## 2023-05-20 LAB — PREPARE RBC (CROSSMATCH)

## 2023-05-20 SURGERY — ENTEROSCOPY
Anesthesia: Monitor Anesthesia Care

## 2023-05-20 MED ORDER — LIDOCAINE 2% (20 MG/ML) 5 ML SYRINGE
INTRAMUSCULAR | Status: DC | PRN
  Administered 2023-05-20: 100 mg via INTRAVENOUS

## 2023-05-20 MED ORDER — IOHEXOL 9 MG/ML PO SOLN
500.0000 mL | ORAL | Status: AC
Start: 2023-05-20 — End: 2023-05-20
  Administered 2023-05-20 (×2): 500 mL via ORAL

## 2023-05-20 MED ORDER — SPOT INK MARKER SYRINGE KIT
PACK | SUBMUCOSAL | Status: AC
  Filled 2023-05-20: qty 5

## 2023-05-20 MED ORDER — SODIUM CHLORIDE 0.9% IV SOLUTION
Freq: Once | INTRAVENOUS | Status: AC
Start: 2023-05-20 — End: 2023-05-20

## 2023-05-20 MED ORDER — GLUCAGON HCL RDNA (DIAGNOSTIC) 1 MG IJ SOLR
INTRAMUSCULAR | Status: DC | PRN
  Administered 2023-05-20: .5 mg via INTRAVENOUS

## 2023-05-20 MED ORDER — ACETAMINOPHEN 325 MG PO TABS
650.0000 mg | ORAL_TABLET | Freq: Once | ORAL | Status: DC
Start: 2023-05-20 — End: 2023-05-21

## 2023-05-20 MED ORDER — PROPOFOL 500 MG/50ML IV EMUL
INTRAVENOUS | Status: DC | PRN
  Administered 2023-05-20: 100 ug/kg/min via INTRAVENOUS

## 2023-05-20 MED ORDER — RIVAROXABAN 20 MG PO TABS
20.0000 mg | ORAL_TABLET | Freq: Every day | ORAL | Status: DC
Start: 2023-05-20 — End: 2023-05-21
  Administered 2023-05-20: 20 mg via ORAL
  Filled 2023-05-20: qty 1

## 2023-05-20 MED ORDER — SODIUM CHLORIDE 0.9 % IV SOLN: INTRAVENOUS | Status: DC | PRN

## 2023-05-20 MED ORDER — PROPOFOL 10 MG/ML IV BOLUS
INTRAVENOUS | Status: DC | PRN
  Administered 2023-05-20: 50 mg via INTRAVENOUS
  Administered 2023-05-20: 20 mg via INTRAVENOUS

## 2023-05-20 MED ORDER — IOHEXOL 350 MG/ML SOLN
80.0000 mL | Freq: Once | INTRAVENOUS | Status: AC | PRN
  Administered 2023-05-20: 80 mL via INTRAVENOUS

## 2023-05-20 MED ORDER — DIPHENHYDRAMINE HCL 25 MG PO CAPS
25.0000 mg | ORAL_CAPSULE | Freq: Once | ORAL | Status: AC
  Administered 2023-05-20: 25 mg via ORAL
  Filled 2023-05-20: qty 1

## 2023-05-20 MED ORDER — SPOT INK MARKER SYRINGE KIT
PACK | SUBMUCOSAL | Status: DC | PRN
  Administered 2023-05-20: 1 mL via SUBMUCOSAL

## 2023-05-20 NOTE — Progress Notes (Signed)
Patient ID: Toronto Trabert, male   DOB: [birth year], 67 y.o.   MRN: [id number] [facility] Surgery Progress Note  Day of Surgery  Subjective: CC-  Complaining of some new lower abdominal pain. States that it started over the weekend. He denies n/v. BM this morning. Tolerating diet. WBC 8.8, afebrile.  S/p endoscopy this morning, a single non-bleeding angioectasia found in the jejunum treated with argon plasma coagulation.  Objective: Vital signs in last 24 hours: Temp:  [97.6 F (36.4 C)-98.6 F (37 C)] 97.9 F (36.6 C) (12/09 1200) Pulse Rate:  [83-94] 94 (12/09 1200) Resp:  [14-24] 24 (12/09 1200) BP: (91-148)/(54-79) 127/79 (12/09 1200) SpO2:  [98 %-100 %] 100 % (12/09 1200) Last BM Date : 05/18/23  Intake/Output from previous day: 12/08 0701 - 12/09 0700 In: -  Out: 154 [Urine:150; Drains:4] Intake/Output this shift: Total I/O In: 100 [I.V.:100] Out: -   PE: Gen:  Alert, NAD Abd: soft, mild distension, nontender, wound at umbilicus open and scabbing without erythema drainage. Left sided surgical drain scant serosanguinous fluid in bulb, IR transgluteal drain scant serosanguinous fluid in bulb  Lab Results:  Recent Labs    05/19/23 0542 05/20/23 0519  WBC 8.7 8.8  HGB 7.8* 7.4*  HCT 27.0* 26.1*  PLT 593* 568*   BMET Recent Labs    05/19/23 0204 05/20/23 0519  NA 138 139  K 5.0 4.3  CL 93* 95*  CO2 38* 37*  GLUCOSE 148* 156*  BUN 15 13  CREATININE 0.90 0.81  CALCIUM 8.6* 8.6*   PT/INR No results for input(s): "LABPROT", "INR" in the last 72 hours. CMP     Component Value Date/Time   NA 139 05/20/2023 0519   NA 142 10/25/2022 1027   K 4.3 05/20/2023 0519   CL 95 (L) 05/20/2023 0519   CO2 37 (H) 05/20/2023 0519   GLUCOSE 156 (H) 05/20/2023 0519   BUN 13 05/20/2023 0519   BUN 14 10/25/2022 1027   CREATININE 0.81 05/20/2023 0519   CREATININE 1.20 07/30/2016 1431   CALCIUM 8.6 (L) 05/20/2023 0519   PROT 6.1 (L) 05/17/2023 0343   PROT 6.7  10/25/2022 1027   ALBUMIN 2.6 (L) 05/20/2023 0519   ALBUMIN 4.4 10/25/2022 1027   AST 69 (H) 05/17/2023 0343   ALT 74 (H) 05/17/2023 0343   ALKPHOS 151 (H) 05/17/2023 0343   BILITOT 0.4 05/17/2023 0343   BILITOT 0.3 10/25/2022 1027   GFRNONAA >60 05/20/2023 0519   GFRNONAA 64 07/30/2016 1431   GFRAA 91 01/29/2020 1020   GFRAA 74 07/30/2016 1431   Lipase     Component Value Date/Time   LIPASE 41 09/20/2021 1620       Studies/Results: No results found.  Anti-infectives: Anti-infectives (From admission, onward)    Start     Dose/Rate Route Frequency Ordered Stop   05/16/23 1200  amoxicillin-clavulanate (AUGMENTIN) 875-125 MG per tablet 1 tablet        1 tablet Oral Every 12 hours 05/16/23 1109     05/14/23 1800  Ampicillin-Sulbactam (UNASYN) 3 g in sodium chloride 0.9 % 100 mL IVPB  Status:  Discontinued        3 g 200 mL/hr over 30 Minutes Intravenous Every 6 hours 05/14/23 0957 05/16/23 1109   05/07/23 0945  piperacillin-tazobactam (ZOSYN) IVPB 3.375 g  Status:  Discontinued        3.375 g 12.5 mL/hr over 240 Minutes Intravenous Every 8 hours 05/07/23 0851 05/14/23 0957   05/07/23 0930  piperacillin-tazobactam (  ZOSYN) IVPB 3.375 g  Status:  Discontinued        3.375 g 100 mL/hr over 30 Minutes Intravenous Every 8 hours 05/07/23 0836 05/07/23 0851   04/30/23 1930  piperacillin-tazobactam (ZOSYN) IVPB 3.375 g        3.375 g 12.5 mL/hr over 240 Minutes Intravenous Every 8 hours 04/30/23 1915 05/05/23 1940   04/30/23 1145  piperacillin-tazobactam (ZOSYN) IVPB 3.375 g        3.375 g 100 mL/hr over 30 Minutes Intravenous  Once 04/30/23 1131 04/30/23 1242        Assessment/Plan POD#19 s/p laparoscopic appendectomy for perforated appendicitis 11/20 - MW - s/p IR drain placement 11/24, culture E coli and Klebsiella pneumoniae.   - CT 12/2 with improving fluid collections, IR drain may be slightly retracted. Surgical drain near other fluid collection - S/p IR drain  exchange 12/3 - Minimal output from both drains. Repeat CT today to see if one or both drains would be ready to come out. - Antibiotics narrowed to Augmentin - encourage PO intake, protein shakes - continue miralax/colace - oob/pulm toilet; encouraged ambulation in halls. Continue therapies - rec SNF   FEN: reg diet, Boost/Ensure ID: zosyn 11/19>>12/3, unasyn 12/3>> VTE: restarting xarelto   AKI, creatinine improving - nephrology s/o Syncope COPD/Chronic resp failure - on 2 lpm at baseline HTN Atrial flutter on xarelto - hold for anemia H/o CVA PVD Tobacco use    LOS: 20 days    Franne Forts, [facility] Surgery 05/20/2023, 1:04 PM Please see Amion for pager number during day hours 7:00am-4:30pm

## 2023-05-20 NOTE — Op Note (Signed)
[facility] Patient Name: Jared [name] Procedure Date : 05/20/2023 MRN: [id number] Attending MD: Jared [name]. Myrtie Neither , MD, [id number] Date of Birth: 11-Feb-1956 CSN: [id number] Age: 67 Admit Type: Inpatient Procedure:                Small bowel enteroscopy Indications:              Iron deficiency anemia secondary to chronic blood                            loss, Unexplained iron deficiency anemia                           Acute on chronic anemia in the setting of                            perforated appendicitis and sepsis.                           History of gastrointestinal AVMs with prior                            endoscopic treatments (details in inpatient consult                            note) Providers:                Jared [name] L. Myrtie Neither, MD, Jared Rosebush, RN Referring MD:             [facility] Medicines:                Monitored Anesthesia Care, Glucagon 0.5mg  IV Complications:            No immediate complications. Estimated Blood Loss:     Estimated blood loss: none. Procedure:                Pre-Anesthesia Assessment:                           - Prior to the procedure, a History and Physical                            was performed, and patient medications and                            allergies were reviewed. The patient's tolerance of                            previous anesthesia was also reviewed. The risks                            and benefits of the procedure and the sedation                            options and risks were discussed with the patient.  All questions were answered, and informed consent                            was obtained. Prior Anticoagulants: The patient has                            taken Eliquis (apixaban), last dose was 7 days                            prior to procedure. ASA Grade Assessment: IV - A                            patient with severe systemic disease that is a                             constant threat to life. After reviewing the risks                            and benefits, the patient was deemed in                            satisfactory condition to undergo the procedure.                           After obtaining informed consent, the endoscope was                            passed under direct vision. Throughout the                            procedure, the patient's blood pressure, pulse, and                            oxygen saturations were monitored continuously. The                            PCF-HQ190L ([id number]) Olympus colonoscope was                            introduced through the mouth and advanced to the                            proximal jejunum. The small bowel enteroscopy was                            accomplished without difficulty. The patient                            tolerated the procedure well. Scope In: Scope Out: Findings:      The esophagus was normal.      The stomach was normal.      The examined duodenum was normal.      A single angioectasia with no bleeding was found in the proximal  jejunum. Coagulation for hemostasis using argon plasma at 1 liter/minute       and 20 watts was successful.      An area distal to that, at the point of maximal scope insertion, was       successfully injected with 1 mL Spot (carbon black) for tattooing.      Exam of the jejunum was otherwise normal. Impression:               - Normal esophagus.                           - Normal stomach.                           - Normal examined duodenum.                           - A single non-bleeding angioectasia in the                            jejunum. Treated with argon plasma coagulation                            (APC). Injected.                           - No specimens collected.                           This patient may have additional AVMs in the more                            distal small bowel that are beyond the reach of                             endoscopic detection and treatment. As such, and                            with the ongoing management of his anemia with                            close monitoring and supportive treatments as noted                            below. Recommendation:           - Return patient to hospital ward for ongoing care.                           - Resume previous diet.                           - Oral anticoagulation can be resumed as soon as                            deemed necessary by his primary medical team.  Continue to monitor hemoglobin regularly and treat                            with IV iron and/or PRBCs as needed.                           Inpatient GI service will sign off, reconsult as                            needed if there is overt GI bleeding with melena                            and drop in hemoglobin. Procedure Code(s):        --- Professional ---                           [phone], Small intestinal endoscopy, enteroscopy                            beyond second portion of duodenum, not including                            ileum; with control of bleeding (eg, injection,                            bipolar cautery, unipolar cautery, laser, heater                            probe, stapler, plasma coagulator)                           44799, Unlisted procedure, small intestine Diagnosis Code(s):        --- Professional ---                           K55.20, Angiodysplasia of colon without hemorrhage                           D50.0, Iron deficiency anemia secondary to blood                            loss (chronic)                           D50.9, Iron deficiency anemia, unspecified CPT copyright 2022 American Medical Association. All rights reserved. The codes documented in this report are preliminary and upon coder review may  be revised to meet current compliance requirements. Jared [name] L. Myrtie Neither, MD 05/20/2023 8:38:55 AM This report has been  signed electronically. Number of Addenda: 0

## 2023-05-20 NOTE — Transfer of Care (Signed)
Immediate Anesthesia Transfer of Care Note  Patient: Onathan Fadness  Procedure(s) Performed: ENTEROSCOPY SUBMUCOSAL TATTOO INJECTION HOT HEMOSTASIS (ARGON PLASMA COAGULATION/BICAP)  Patient Location: PACU  Anesthesia Type:MAC  Level of Consciousness: drowsy and responds to stimulation  Airway & Oxygen Therapy: Patient Spontanous Breathing and Patient connected to face mask oxygen  Post-op Assessment: Report given to RN and Post -op Vital signs reviewed and stable  Post vital signs: Reviewed and stable  Last Vitals:  Vitals Value Taken Time  BP    Temp    Pulse    Resp    SpO2      Last Pain:  Vitals:   05/20/23 0740  TempSrc: Temporal  PainSc: 0-No pain      Patients Stated Pain Goal: 2 (05/18/23 2206)  Complications: No notable events documented.

## 2023-05-20 NOTE — Plan of Care (Signed)

## 2023-05-20 NOTE — Anesthesia Preprocedure Evaluation (Addendum)
Anesthesia Evaluation  Patient identified by MRN, date of birth, ID band Patient awake    Reviewed: Allergy & Precautions, NPO status , Patient's Chart, lab work & pertinent test results, reviewed documented beta blocker date and time   History of Anesthesia Complications Negative for: history of anesthetic complications  Airway Mallampati: II  TM Distance: >3 FB Neck ROM: Full    Dental  (+) Missing,    Pulmonary COPD,  oxygen dependent, Current Smoker and Patient abstained from smoking.   Pulmonary exam normal        Cardiovascular hypertension, Pt. on home beta blockers and Pt. on medications + CAD and +CHF  Normal cardiovascular exam  TTE 05/02/23: EF 55-60%, grade II DD, RV function moderately reduced, moderate RVE, mild LAE     Neuro/Psych  Headaches CVA, No Residual Symptoms    GI/Hepatic Neg liver ROS, PUD,GERD  Medicated,,GI bleed   Endo/Other  diabetes, Type 2    Renal/GU      Musculoskeletal   Abdominal   Peds  Hematology  (+) Blood dyscrasia (Hgb 7.4), anemia   Anesthesia Other Findings Day of surgery medications reviewed with patient.  Reproductive/Obstetrics                              Anesthesia Physical Anesthesia Plan  ASA: 4  Anesthesia Plan: MAC   Post-op Pain Management: Minimal or no pain anticipated   Induction:   PONV Risk Score and Plan: 0 and Treatment may vary due to age or medical condition and Propofol infusion  Airway Management Planned: Natural Airway and Nasal Cannula  Additional Equipment: None  Intra-op Plan:   Post-operative Plan:   Informed Consent: I have reviewed the patients History and Physical, chart, labs and discussed the procedure including the risks, benefits and alternatives for the proposed anesthesia with the patient or authorized representative who has indicated his/her understanding and acceptance.       Plan Discussed  with: CRNA  Anesthesia Plan Comments:         Anesthesia Quick Evaluation

## 2023-05-20 NOTE — Interval H&P Note (Signed)
History and Physical Interval Note:  05/20/2023 7:47 AM  Jared [name]  has presented today for surgery, with the diagnosis of Anemia, GI bleed.  The various methods of treatment have been discussed with the patient and family. After consideration of risks, benefits and other options for treatment, the patient has consented to  Procedure(s): ENTEROSCOPY (N/A) as a surgical intervention.  The patient's history has been reviewed, patient examined, no change in status, stable for surgery.  I have reviewed the patient's chart and labs.  Questions were answered to the patient's satisfaction.    Hgb stable at 7.4 today Cardiopulmonary condition stable overnight Cardiac rhythm regular in the 70s, globally decreased breath sounds per his usual.  No wheezing or respiratory distress today Charlie Pitter III

## 2023-05-20 NOTE — Progress Notes (Signed)
PROGRESS NOTE        PATIENT DETAILS Name: Jared [name] Age: 67 y.o. Sex: male Date of Birth: 1955-11-14 Admit Date: 04/30/2023 Admitting Physician Anselm Jungling, DO AYT:[id number], Odette Horns, MD  Brief Summary: Patient is a 67 y.o.  male with history of COPD on 2 L of oxygen at home, A-fib, HTN, chronic combined systolic/diastolic CHF-who presented with abdominal/testicular pain-found to have sepsis due to acute appendicitis with perforation.  Significant events: 11/19>> admit to [facility] 11/20>> laparoscopic appendectomy 11/24>> transgluteal pelvic drain placement-respiratory distress postprocedure-transferred to ICU. 12/03>> drain injection/manipulation and drain exchange to place catheter in the abscess.  Significant studies: 11/19>> scrotal ultrasound/Doppler ultrasound of testicle: Unremarkable 11/19>> CT head: No acute findings 11/19>> CT abdomen/pelvis: Acute perforated appendicitis. 11/21>> VQ scan: PE absent 11/21>> echo: EF 55-60%, grade 2 diastolic dysfunction, RV systolic function moderately reduced, vague density in the IVC-cannot rule out thrombus 11/21>> IVC/iliac Doppler: Poor study 11/22>> bilateral lower extremity Doppler: No DVT 11/23>> CT abdomen/pelvis: Deep pelvic abscess 12/02>> CT abdomen/pelvis: Decreased volume of pelvic abscess-pigtail drainage catheter is noted along the right inferior margin of this fluid collection.  Significant microbiology data: 11/19>> COVID/influenza/RSV PCR: Negative 11/19>> blood culture: No growth 11/24>> pelvic abscess culture: E. 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  Consults: Cardiology General Surgery IR GI  Subjective: No planes overnight-lying comfortably in bed.  Objective: Vitals: Blood pressure 133/73, pulse 84, temperature 97.8 F  (36.6 C), temperature source Temporal, resp. rate (!) 23, height 6\' 3"  (1.905 m), weight 110.1 kg, SpO2 100%.   Exam: Gen Exam:Alert awake-not in any distress HEENT:atraumatic, normocephalic Chest: B/L clear to auscultation anteriorly CVS:S1S2 regular Abdomen:soft non tender, non distended Extremities:no edema Neurology: Non focal Skin: no rash  Pertinent Labs/Radiology:    Latest Ref Rng & Units 05/20/2023    5:19 AM 05/19/2023    5:42 AM 05/18/2023    3:56 AM  CBC  WBC 4.0 - 10.5 K/uL 8.8  8.7  9.3   Hemoglobin 13.0 - 17.0 g/dL 7.4  7.8  7.4   Hematocrit 39.0 - 52.0 % 26.1  27.0  25.3   Platelets 150 - 400 K/uL 568  593  585     Lab Results  Component Value Date   NA 139 05/20/2023   K 4.3 05/20/2023   CL 95 (L) 05/20/2023   CO2 37 (H) 05/20/2023      Assessment/Plan: Sepsis due to acute appendicitis with perforation s/p laparoscopic appendectomy 11/20-with development of residual deep pelvic abscess requiring drain placement by IR on 11/24 and drain exchange on 12/3 Stable Continue to monitor drain output Discussed with general surgery team on 12/4-okay to switch to Augmentin-and to continue  for the next 2 weeks or so until patient gets a repeat CT abdomen.    Acute on chronic hypoxic respiratory failure Respiratory decompensation after lying prone for drain placement on 11/24-briefly required NRB-and close ICU monitoring. Currently back on usual home regimen of 2-3 L.  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 Continue PPI Resume Xarelto Follow CBC.   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  Atrial flutter Rate controlled Beta-blocker Xarelto to resume today.  NSVT Telemetry Beta-blocker  HTN Stable Metoprolol/Imdur/hydralazine  HLD Statin  PAD Statin Continue to hold aspirin-do not think we should do aspirin with Xarelto-given GI bleeding/AVMs.  History of recent syncope Chest pain No chest pain this morning Cardiology planning outpatient monitor Telemetry monitoring in the interim.  Mood disorder Cymbalta/Neurontin  Tobacco abuse Transdermal nicotine  Debility/deconditioning PT/OT eval-SNF recommended  Nutrition Status: Nutrition Problem: Moderate Malnutrition Etiology: acute illness Signs/Symptoms: energy intake < 75% for > 7 days, mild muscle depletion, mild fat depletion Interventions: Refer to RD note for recommendations, MVI, Ensure Enlive (each supplement provides 350kcal and 20 grams of protein), Boost Breeze  Obesity: Estimated body mass index is 30.34 kg/m as calculated from the following:   Height as of this encounter: 6\' 3"  (1.905 m).   Weight as of this encounter: 110.1 kg.   Code status:   Code Status: Full Code   DVT Prophylaxis: SCDs Start: 04/30/23 1909    Family Communication: None at bedside   Disposition Plan: Status is: Inpatient Remains inpatient appropriate because: Severity of illness   Planned Discharge Destination:Skilled nursing facility   Diet: Diet Order             Diet Heart Room service appropriate? Yes; Fluid consistency: Thin  Diet effective now                     Antimicrobial agents: Anti-infectives (From admission, onward)    Start     Dose/Rate Route Frequency Ordered Stop   05/16/23 1200  amoxicillin-clavulanate (AUGMENTIN) 875-125 MG per tablet 1 tablet         1 tablet Oral Every 12 hours 05/16/23 1109     05/14/23 1800  Ampicillin-Sulbactam (UNASYN) 3 g in sodium chloride 0.9 % 100 mL IVPB  Status:  Discontinued        3 g 200 mL/hr over 30 Minutes Intravenous Every 6 hours 05/14/23 0957 05/16/23 1109   05/07/23 0945  piperacillin-tazobactam (ZOSYN) IVPB 3.375 g  Status:  Discontinued        3.375 g 12.5 mL/hr over 240 Minutes Intravenous Every 8 hours 05/07/23 0851 05/14/23 0957   05/07/23 0930  piperacillin-tazobactam (ZOSYN) IVPB 3.375 g  Status:  Discontinued        3.375 g 100 mL/hr over 30 Minutes Intravenous Every 8 hours 05/07/23 0836 05/07/23 0851   04/30/23 1930  piperacillin-tazobactam (ZOSYN) IVPB 3.375 g        3.375 g 12.5 mL/hr over 240 Minutes Intravenous Every 8 hours 04/30/23 1915 05/05/23 1940   04/30/23 1145  piperacillin-tazobactam (ZOSYN) IVPB 3.375 g        3.375 g 100 mL/hr over 30 Minutes Intravenous  Once 04/30/23 1131 04/30/23 1242        MEDICATIONS: Scheduled Meds:  acetaminophen  650 mg Oral Q6H   amoxicillin-clavulanate  1 tablet Oral Q12H   arformoterol  15 mcg Nebulization BID   atorvastatin  80 mg Oral Daily   bisacodyl  10 mg Rectal Once   budesonide (PULMICORT) nebulizer solution  0.5 mg Nebulization BID   Chlorhexidine Gluconate Cloth  6 each Topical Daily   docusate sodium  100 mg Oral BID   DULoxetine  60 mg Oral Daily   ezetimibe  10 mg Oral Daily   feeding supplement  237 mL Oral BID BM   ferrous sulfate  325 mg Oral BID WC   folic acid  1 mg Oral Daily   furosemide  40 mg Oral Daily   gabapentin  300 mg Oral BID   hydrALAZINE  50 mg Oral Q8H   isosorbide mononitrate  30 mg Oral Daily   metoprolol succinate  75 mg Oral Daily   multivitamin with minerals  1 tablet Oral Daily   nicotine  21 mg Transdermal Daily   pantoprazole  40 mg Oral BID   polyethylene glycol  17 g Oral BID   revefenacin  175 mcg Nebulization Daily   sodium chloride flush  5 mL Intracatheter Q8H   Continuous  Infusions:   PRN Meds:.albuterol, bisacodyl, haloperidol lactate, methocarbamol, morphine injection, oxyCODONE   I have personally reviewed following labs and imaging studies  LABORATORY DATA: CBC: Recent Labs  Lab 05/16/23 0533 05/16/23 1942 05/17/23 0343 05/18/23 0356 05/19/23 0542 05/20/23 0519  WBC 8.8  --  9.6 9.3 8.7 8.8  HGB 6.7* 7.5* 7.4* 7.4* 7.8* 7.4*  HCT 23.7* 25.3* 25.8* 25.3* 27.0* 26.1*  MCV 92.6  --  89.9 91.7 92.5 92.9  PLT 549*  --  571* 585* 593* 568*    Basic Metabolic Panel: Recent Labs  Lab 05/14/23 0443 05/15/23 0436 05/16/23 0533 05/17/23 0343 05/19/23 0204 05/20/23 0519  NA 138 136 136 138 138 139  K 4.6 5.0 4.9 4.3 5.0 4.3  CL 94* 93* 90* 94* 93* 95*  CO2 37* 39* 40* 38* 38* 37*  GLUCOSE 143* 155* 137* 128* 148* 156*  BUN 11 16 15 16 15 13   CREATININE 0.75 0.86 0.91 0.81 0.90 0.81  CALCIUM 8.5* 8.5* 8.7* 8.7* 8.6* 8.6*  MG  --   --   --   --  2.2  --   PHOS 3.5 3.0  --   --   --  3.0    GFR: Estimated Creatinine Clearance: 115.3 mL/min (by C-G formula based on SCr of 0.81 mg/dL).  Liver Function Tests: Recent Labs  Lab 05/16/23 0533 05/17/23 0343 05/20/23 0519  AST 49* 69*  --   ALT 55* 74*  --   ALKPHOS 144* 151*  --   BILITOT 0.3 0.4  --   PROT 6.0* 6.1*  --   ALBUMIN 2.4* 2.5* 2.6*   No results for input(s): "LIPASE", "AMYLASE" in the last 168 hours. No results for input(s): "AMMONIA" in the last 168 hours.  Coagulation Profile: No results for input(s): "INR", "PROTIME" in the last 168 hours.  Cardiac Enzymes: No results for input(s): "CKTOTAL", "CKMB", "CKMBINDEX", "TROPONINI" in the last 168 hours.  BNP (last 3 results) No results for input(s): "PROBNP" in the last 8760 hours.  Lipid Profile: No results for input(s): "CHOL", "HDL", "LDLCALC", "TRIG", "CHOLHDL", "LDLDIRECT" in the last 72 hours.  Thyroid Function Tests: No results for input(s): "TSH", "T4TOTAL", "FREET4", "T3FREE", "THYROIDAB" in the last 72  hours.  Anemia Panel: No results for input(s): "VITAMINB12", "FOLATE", "FERRITIN", "TIBC", "IRON", "RETICCTPCT" in the last 72 hours.  Urine analysis:    Component Value Date/Time   COLORURINE YELLOW 04/30/2023 2155   APPEARANCEUR CLEAR 04/30/2023 2155   LABSPEC 1.028 04/30/2023 2155   PHURINE 5.0 04/30/2023 2155   GLUCOSEU NEGATIVE 04/30/2023 2155   HGBUR SMALL (A) 04/30/2023 2155   BILIRUBINUR NEGATIVE  04/30/2023 2155   BILIRUBINUR negative 09/20/2021 1620   KETONESUR NEGATIVE 04/30/2023 2155   PROTEINUR NEGATIVE 04/30/2023 2155   UROBILINOGEN 0.2 09/20/2021 1620   NITRITE NEGATIVE 04/30/2023 2155   LEUKOCYTESUR NEGATIVE 04/30/2023 2155    Sepsis Labs: Lactic Acid, Venous    Component Value Date/Time   LATICACIDVEN 1.1 05/03/2023 0657    MICROBIOLOGY: No results found for this or any previous visit (from the past 240 hour(s)).   RADIOLOGY STUDIES/RESULTS: No results found.   LOS: 20 days   Jeoffrey Massed, MD  [facility]    To contact the attending provider between 7A-7P or the covering provider during after hours 7P-7A, please log into the web site [email or web address] and access using universal [place] password for that web site. If you do not have the password, please call the hospital operator.  05/20/2023, 10:53 AM

## 2023-05-20 NOTE — Anesthesia Postprocedure Evaluation (Signed)
Anesthesia Post Note  Patient: [name]  Procedure(s) Performed: ENTEROSCOPY SUBMUCOSAL TATTOO INJECTION HOT HEMOSTASIS (ARGON PLASMA COAGULATION/BICAP)     Patient location during evaluation: PACU Anesthesia Type: MAC Level of consciousness: awake and alert Pain management: pain level controlled Vital Signs Assessment: post-procedure vital signs reviewed and stable Respiratory status: spontaneous breathing, nonlabored ventilation and respiratory function stable Cardiovascular status: blood pressure returned to baseline Postop Assessment: no apparent nausea or vomiting Anesthetic complications: no   No notable events documented.  Last Vitals:  Vitals:   05/20/23 0840 05/20/23 0845  BP: 115/76   Pulse: 87 85  Resp: 19 18  Temp:    SpO2: 100% 100%    Last Pain:  Vitals:   05/20/23 0831  TempSrc: Temporal  PainSc: 0-No pain                 Shanda Howells

## 2023-05-20 NOTE — TOC Progression Note (Signed)
Transition of Care [facility]) - Progression Note    Patient Details  Name: Jared [name] MRN: [id number] Date of Birth: 17-Dec-1955  [facility] CM/SW Contact  Mearl Latin, LCSW Phone Number: 05/20/2023, 9:41 AM  Clinical Narrative:    CSW awaiting medical stability for SNF. Insurance had approved [facility] effective 05/16/2023-05/20/2023 (12/10 until midnight), Ref# [id number], Auth SW#[id number].    Expected Discharge Plan: Skilled Nursing Facility Barriers to Discharge: Continued Medical Work up, English as a second language teacher  Expected Discharge Plan and Services In-house Referral: Clinical Social Work Discharge Planning Services: [employer] Consult Post Acute Care Choice: Skilled Nursing Facility Living arrangements for the past 2 months: Single Family Home                 DME Arranged: N/A         HH Arranged: NA           Social Determinants of Health (SDOH) Interventions SDOH Screenings   Food Insecurity: No Food Insecurity (04/30/2023)  Housing: Low Risk  (04/30/2023)  Transportation Needs: No Transportation Needs (04/30/2023)  Utilities: Not At Risk (04/30/2023)  Alcohol Screen: Low Risk  (02/06/2023)  Depression (PHQ2-9): Low Risk  (02/04/2023)  Recent Concern: Depression (PHQ2-9) - High Risk (01/09/2023)  Financial Resource Strain: Medium Risk (02/06/2023)  Physical Activity: Insufficiently Active (02/06/2023)  Social Connections: Moderately Integrated (02/06/2023)  Stress: No Stress Concern Present (02/06/2023)  Tobacco Use: High Risk (05/05/2023)  Health Literacy: Adequate Health Literacy (02/06/2023)    Readmission Risk Interventions    12/27/2022    2:20 PM 12/14/2021    1:38 PM 02/01/2021    3:22 PM  Readmission Risk Prevention Plan  Post Dischage Appt Complete Complete   Medication Screening Complete Complete   Transportation Screening Complete Complete Complete  PCP or Specialist Appt within 5-7 Days   Complete  Home Care Screening   Complete  Medication  Review (RN CM)   Complete

## 2023-05-21 ENCOUNTER — Encounter (HOSPITAL_COMMUNITY): Payer: Self-pay | Admitting: Gastroenterology

## 2023-05-21 DIAGNOSIS — E785 Hyperlipidemia, unspecified: Secondary | ICD-10-CM | POA: Diagnosis not present

## 2023-05-21 DIAGNOSIS — E440 Moderate protein-calorie malnutrition: Secondary | ICD-10-CM | POA: Diagnosis not present

## 2023-05-21 DIAGNOSIS — D62 Acute posthemorrhagic anemia: Secondary | ICD-10-CM | POA: Diagnosis not present

## 2023-05-21 DIAGNOSIS — Z9049 Acquired absence of other specified parts of digestive tract: Secondary | ICD-10-CM | POA: Diagnosis not present

## 2023-05-21 DIAGNOSIS — Z8673 Personal history of transient ischemic attack (TIA), and cerebral infarction without residual deficits: Secondary | ICD-10-CM | POA: Diagnosis not present

## 2023-05-21 DIAGNOSIS — I4892 Unspecified atrial flutter: Secondary | ICD-10-CM | POA: Diagnosis not present

## 2023-05-21 DIAGNOSIS — J441 Chronic obstructive pulmonary disease with (acute) exacerbation: Secondary | ICD-10-CM | POA: Diagnosis not present

## 2023-05-21 DIAGNOSIS — I5022 Chronic systolic (congestive) heart failure: Secondary | ICD-10-CM | POA: Diagnosis not present

## 2023-05-21 DIAGNOSIS — J449 Chronic obstructive pulmonary disease, unspecified: Secondary | ICD-10-CM | POA: Diagnosis not present

## 2023-05-21 DIAGNOSIS — I272 Pulmonary hypertension, unspecified: Secondary | ICD-10-CM | POA: Diagnosis not present

## 2023-05-21 DIAGNOSIS — I110 Hypertensive heart disease with heart failure: Secondary | ICD-10-CM | POA: Diagnosis not present

## 2023-05-21 DIAGNOSIS — Z8719 Personal history of other diseases of the digestive system: Secondary | ICD-10-CM | POA: Diagnosis not present

## 2023-05-21 DIAGNOSIS — I4891 Unspecified atrial fibrillation: Secondary | ICD-10-CM | POA: Diagnosis not present

## 2023-05-21 DIAGNOSIS — Z7401 Bed confinement status: Secondary | ICD-10-CM | POA: Diagnosis not present

## 2023-05-21 DIAGNOSIS — D75839 Thrombocytosis, unspecified: Secondary | ICD-10-CM | POA: Diagnosis not present

## 2023-05-21 DIAGNOSIS — J988 Other specified respiratory disorders: Secondary | ICD-10-CM | POA: Diagnosis not present

## 2023-05-21 DIAGNOSIS — J9611 Chronic respiratory failure with hypoxia: Secondary | ICD-10-CM | POA: Diagnosis not present

## 2023-05-21 DIAGNOSIS — K3532 Acute appendicitis with perforation and localized peritonitis, without abscess: Secondary | ICD-10-CM | POA: Diagnosis not present

## 2023-05-21 DIAGNOSIS — I10 Essential (primary) hypertension: Secondary | ICD-10-CM | POA: Diagnosis not present

## 2023-05-21 DIAGNOSIS — F329 Major depressive disorder, single episode, unspecified: Secondary | ICD-10-CM | POA: Diagnosis not present

## 2023-05-21 DIAGNOSIS — R55 Syncope and collapse: Secondary | ICD-10-CM | POA: Diagnosis not present

## 2023-05-21 DIAGNOSIS — I5020 Unspecified systolic (congestive) heart failure: Secondary | ICD-10-CM | POA: Diagnosis not present

## 2023-05-21 DIAGNOSIS — I5032 Chronic diastolic (congestive) heart failure: Secondary | ICD-10-CM | POA: Diagnosis not present

## 2023-05-21 LAB — BPAM RBC
Blood Product Expiration Date: 202412282359
ISSUE DATE / TIME: 202412091348
Unit Type and Rh: 5100

## 2023-05-21 LAB — TYPE AND SCREEN
ABO/RH(D): O POS
Antibody Screen: NEGATIVE
Unit division: 0

## 2023-05-21 LAB — CBC
HCT: 28.4 % — ABNORMAL LOW (ref 39.0–52.0)
Hemoglobin: 8.4 g/dL — ABNORMAL LOW (ref 13.0–17.0)
MCH: 26.8 pg (ref 26.0–34.0)
MCHC: 29.6 g/dL — ABNORMAL LOW (ref 30.0–36.0)
MCV: 90.4 fL (ref 80.0–100.0)
Platelets: 515 10*3/uL — ABNORMAL HIGH (ref 150–400)
RBC: 3.14 MIL/uL — ABNORMAL LOW (ref 4.22–5.81)
RDW: 16.7 % — ABNORMAL HIGH (ref 11.5–15.5)
WBC: 8.3 10*3/uL (ref 4.0–10.5)
nRBC: 0 % (ref 0.0–0.2)

## 2023-05-21 LAB — GLUCOSE, CAPILLARY: Glucose-Capillary: 176 mg/dL — ABNORMAL HIGH (ref 70–99)

## 2023-05-21 MED ORDER — POLYETHYLENE GLYCOL 3350 17 G PO PACK: 17.0000 g | PACK | Freq: Every day | ORAL | 0 refills | Status: DC

## 2023-05-21 MED ORDER — ALBUTEROL SULFATE (2.5 MG/3ML) 0.083% IN NEBU: 2.5000 mg | INHALATION_SOLUTION | RESPIRATORY_TRACT | Status: DC | PRN

## 2023-05-21 MED ORDER — METOPROLOL SUCCINATE ER 50 MG PO TB24: 75.0000 mg | ORAL_TABLET | Freq: Every day | ORAL | Status: DC

## 2023-05-21 MED ORDER — ADULT MULTIVITAMIN W/MINERALS CH: 1.0000 | ORAL_TABLET | Freq: Every day | ORAL | Status: DC

## 2023-05-21 MED ORDER — FERROUS SULFATE 325 (65 FE) MG PO TABS: 325.0000 mg | ORAL_TABLET | Freq: Two times a day (BID) | ORAL | Status: DC

## 2023-05-21 MED ORDER — OXYCODONE HCL 5 MG PO TABS: 5.0000 mg | ORAL_TABLET | Freq: Four times a day (QID) | ORAL | 0 refills | Status: DC | PRN

## 2023-05-21 MED ORDER — FUROSEMIDE 40 MG PO TABS: 40.0000 mg | ORAL_TABLET | Freq: Every day | ORAL | Status: DC

## 2023-05-21 MED ORDER — ALBUTEROL SULFATE HFA 108 (90 BASE) MCG/ACT IN AERS: 2.0000 | INHALATION_SPRAY | Freq: Four times a day (QID) | RESPIRATORY_TRACT | Status: DC | PRN

## 2023-05-21 MED ORDER — ENSURE ENLIVE PO LIQD: 237.0000 mL | Freq: Two times a day (BID) | ORAL | Status: DC

## 2023-05-21 MED ORDER — PANTOPRAZOLE SODIUM 40 MG PO TBEC: 40.0000 mg | DELAYED_RELEASE_TABLET | Freq: Two times a day (BID) | ORAL | Status: DC

## 2023-05-21 NOTE — Progress Notes (Signed)
IR Note  Drain placed 05/05/23 by Dr. Elby Showers.   CT scan obtained 05/20/23. Impression: Changes consistent with prior appendectomy. Persistent inflammatory changes are seen adjacent to the cecum. No abscess or extravasation is noted.Repositioning of the drainage catheter right hemipelvis with decompression of previously seen residual air-fluid collection.  No new focal abnormality is noted.  Minimal output for 3 days.  Afebrile WBC is within normal.  CT scan results discussed with Dr. Lowella Dandy. Drain removal was approved by Dr. Lowella Dandy.   Drain removed 05/21/23 without immediate complications. Dressing placed.

## 2023-05-21 NOTE — Plan of Care (Signed)
  Problem: Education: Goal: Knowledge of General Education information will improve Description: Including pain rating scale, medication(s)/side effects and non-pharmacologic comfort measures 05/21/2023 1317 by Horris Latino, RN Outcome: Adequate for Discharge 05/21/2023 1317 by Horris Latino, RN Outcome: Progressing   Problem: Health Behavior/Discharge Planning: Goal: Ability to manage health-related needs will improve 05/21/2023 1317 by Horris Latino, RN Outcome: Adequate for Discharge 05/21/2023 1317 by Horris Latino, RN Outcome: Progressing   Problem: Clinical Measurements: Goal: Ability to maintain clinical measurements within normal limits will improve 05/21/2023 1317 by Horris Latino, RN Outcome: Adequate for Discharge 05/21/2023 1317 by Horris Latino, RN Outcome: Progressing Goal: Will remain free from infection 05/21/2023 1317 by Horris Latino, RN Outcome: Adequate for Discharge 05/21/2023 1317 by Horris Latino, RN Outcome: Progressing Goal: Diagnostic test results will improve 05/21/2023 1317 by Horris Latino, RN Outcome: Adequate for Discharge 05/21/2023 1317 by Horris Latino, RN Outcome: Progressing Goal: Respiratory complications will improve 05/21/2023 1317 by Horris Latino, RN Outcome: Adequate for Discharge 05/21/2023 1317 by Horris Latino, RN Outcome: Progressing Goal: Cardiovascular complication will be avoided 05/21/2023 1317 by Horris Latino, RN Outcome: Adequate for Discharge 05/21/2023 1317 by Horris Latino, RN Outcome: Progressing   Problem: Clinical Measurements: Goal: Will remain free from infection 05/21/2023 1317 by Horris Latino, RN Outcome: Adequate for Discharge 05/21/2023 1317 by Horris Latino, RN Outcome: Progressing   Problem: Activity: Goal: Risk for activity intolerance will decrease 05/21/2023 1317 by Horris Latino, RN Outcome: Adequate for Discharge 05/21/2023 1317 by Horris Latino, RN Outcome: Progressing   Problem: Nutrition: Goal:  Adequate nutrition will be maintained 05/21/2023 1317 by Horris Latino, RN Outcome: Adequate for Discharge 05/21/2023 1317 by Horris Latino, RN Outcome: Progressing   Problem: Coping: Goal: Level of anxiety will decrease 05/21/2023 1317 by Horris Latino, RN Outcome: Adequate for Discharge 05/21/2023 1317 by Horris Latino, RN Outcome: Progressing   Problem: Elimination: Goal: Will not experience complications related to bowel motility 05/21/2023 1317 by Horris Latino, RN Outcome: Adequate for Discharge 05/21/2023 1317 by Horris Latino, RN Outcome: Progressing Goal: Will not experience complications related to urinary retention 05/21/2023 1317 by Horris Latino, RN Outcome: Adequate for Discharge 05/21/2023 1317 by Horris Latino, RN Outcome: Progressing   Problem: Pain Management: Goal: General experience of comfort will improve 05/21/2023 1317 by Horris Latino, RN Outcome: Adequate for Discharge 05/21/2023 1317 by Horris Latino, RN Outcome: Progressing   Problem: Safety: Goal: Ability to remain free from injury will improve 05/21/2023 1317 by Horris Latino, RN Outcome: Adequate for Discharge 05/21/2023 1317 by Horris Latino, RN Outcome: Progressing   Problem: Skin Integrity: Goal: Risk for impaired skin integrity will decrease 05/21/2023 1317 by Horris Latino, RN Outcome: Adequate for Discharge 05/21/2023 1317 by Horris Latino, RN Outcome: Progressing

## 2023-05-21 NOTE — Progress Notes (Signed)
Patient ID: Jared [name], male   DOB: 09/02/1955, 67 y.o.   MRN: [id number] [facility] Progress Note  1 Day Post-Op  Subjective: CC-  No abdominal complaints. Asking for regular diet rather than heart healthy. Denies n/v. BM yesterday. Minimal drain output.  Objective: Vital signs in last 24 hours: Temp:  [97.9 F (36.6 C)-98.8 F (37.1 C)] 98.7 F (37.1 C) (12/10 0438) Pulse Rate:  [72-94] 72 (12/10 0438) Resp:  [15-24] 15 (12/10 0438) BP: (114-139)/(60-79) 114/60 (12/10 0438) SpO2:  [98 %-100 %] 99 % (12/10 0813) Last BM Date : 05/21/23  Intake/Output from previous day: 12/09 0701 - 12/10 0700 In: 100 [I.V.:100] Out: 1755 [Urine:1750; Drains:5] Intake/Output this shift: No intake/output data recorded.  PE: Gen:  Alert, NAD Abd: soft, nondistended, nontender, wound at umbilicus open and scabbing without erythema drainage. Left sided surgical drain scant serosanguinous fluid in bulb, IR transgluteal drain scant serosanguinous fluid in bulb  Lab Results:  Recent Labs    05/20/23 0519 05/21/23 0434  WBC 8.8 8.3  HGB 7.4* 8.4*  HCT 26.1* 28.4*  PLT 568* 515*   BMET Recent Labs    05/19/23 0204 05/20/23 0519  NA 138 139  K 5.0 4.3  CL 93* 95*  CO2 38* 37*  GLUCOSE 148* 156*  BUN 15 13  CREATININE 0.90 0.81  CALCIUM 8.6* 8.6*   PT/INR No results for input(s): "LABPROT", "INR" in the last 72 hours. CMP     Component Value Date/Time   NA 139 05/20/2023 0519   NA 142 10/25/2022 1027   K 4.3 05/20/2023 0519   CL 95 (L) 05/20/2023 0519   CO2 37 (H) 05/20/2023 0519   GLUCOSE 156 (H) 05/20/2023 0519   BUN 13 05/20/2023 0519   BUN 14 10/25/2022 1027   CREATININE 0.81 05/20/2023 0519   CREATININE 1.20 07/30/2016 1431   CALCIUM 8.6 (L) 05/20/2023 0519   PROT 6.1 (L) 05/17/2023 0343   PROT 6.7 10/25/2022 1027   ALBUMIN 2.6 (L) 05/20/2023 0519   ALBUMIN 4.4 10/25/2022 1027   AST 69 (H) 05/17/2023 0343   ALT 74 (H) 05/17/2023 0343   ALKPHOS 151  (H) 05/17/2023 0343   BILITOT 0.4 05/17/2023 0343   BILITOT 0.3 10/25/2022 1027   GFRNONAA >60 05/20/2023 0519   GFRNONAA 64 07/30/2016 1431   GFRAA 91 01/29/2020 1020   GFRAA 74 07/30/2016 1431   Lipase     Component Value Date/Time   LIPASE 41 09/20/2021 1620       Studies/Results: CT ABDOMEN PELVIS W CONTRAST  Result Date: 05/20/2023 CLINICAL DATA:  Abdominal pain EXAM: CT ABDOMEN AND PELVIS WITH CONTRAST TECHNIQUE: Multidetector CT imaging of the abdomen and pelvis was performed using the standard protocol following bolus administration of intravenous contrast. RADIATION DOSE REDUCTION: This exam was performed according to the departmental dose-optimization program which includes automated exposure control, adjustment of the mA and/or kV according to patient size and/or use of iterative reconstruction technique. CONTRAST:  80mL OMNIPAQUE IOHEXOL 350 MG/ML SOLN COMPARISON:  05/13/2023 FINDINGS: Lower chest: No acute abnormality. Hepatobiliary: No focal liver abnormality is seen. No gallstones, gallbladder wall thickening, or biliary dilatation. Pancreas: Unremarkable. No pancreatic ductal dilatation or surrounding inflammatory changes. Spleen: Normal in size without focal abnormality. Adrenals/Urinary Tract: Adrenal glands are within normal limits. Kidneys demonstrate no renal calculi or obstructive changes. The bladder is within normal limits. Stomach/Bowel: No obstructive or inflammatory changes of the colon are seen. Inflammatory changes are noted adjacent to the cecum consistent  with the history of recent appendectomy. No definitive abscess is seen. No extravasation of contrast is noted. Small bowel and stomach are within normal limits. Vascular/Lymphatic: Aortic atherosclerosis. No enlarged abdominal or pelvic lymph nodes. Reproductive: Prostate is unremarkable. Other: Previously seen fluid collection anterior to the rectum is noted and is smaller in size following repositioning the  drainage catheter. No significant residual fluid collection is noted. No new abscess is seen. Postsurgical drain is also noted and stable. Musculoskeletal: No acute or significant osseous findings. IMPRESSION: Changes consistent with prior appendectomy. Persistent inflammatory changes are seen adjacent to the cecum. No abscess or extravasation is noted. Repositioning of the drainage catheter right hemipelvis with decompression of previously seen residual air-fluid collection. No new focal abnormality is noted. Electronically Signed   By: Alcide Clever M.D.   On: 05/20/2023 23:47    Anti-infectives: Anti-infectives (From admission, onward)    Start     Dose/Rate Route Frequency Ordered Stop   05/16/23 1200  amoxicillin-clavulanate (AUGMENTIN) 875-125 MG per tablet 1 tablet  Status:  Discontinued        1 tablet Oral Every 12 hours 05/16/23 1109 05/21/23 0843   05/14/23 1800  Ampicillin-Sulbactam (UNASYN) 3 g in sodium chloride 0.9 % 100 mL IVPB  Status:  Discontinued        3 g 200 mL/hr over 30 Minutes Intravenous Every 6 hours 05/14/23 0957 05/16/23 1109   05/07/23 0945  piperacillin-tazobactam (ZOSYN) IVPB 3.375 g  Status:  Discontinued        3.375 g 12.5 mL/hr over 240 Minutes Intravenous Every 8 hours 05/07/23 0851 05/14/23 0957   05/07/23 0930  piperacillin-tazobactam (ZOSYN) IVPB 3.375 g  Status:  Discontinued        3.375 g 100 mL/hr over 30 Minutes Intravenous Every 8 hours 05/07/23 0836 05/07/23 0851   04/30/23 1930  piperacillin-tazobactam (ZOSYN) IVPB 3.375 g        3.375 g 12.5 mL/hr over 240 Minutes Intravenous Every 8 hours 04/30/23 1915 05/05/23 1940   04/30/23 1145  piperacillin-tazobactam (ZOSYN) IVPB 3.375 g        3.375 g 100 mL/hr over 30 Minutes Intravenous  Once 04/30/23 1131 04/30/23 1242        Assessment/Plan POD#20 s/p laparoscopic appendectomy for perforated appendicitis 11/20 - MW - s/p IR drain placement 11/24, culture E coli and Klebsiella pneumoniae.   -  S/p IR drain exchange 12/3 - CT 12/9 with resolved fluid collections. Surgical drain removed this morning and antibiotics stopped. Will reach out to IR about possibly removing transgluteal drain too. - encourage PO intake, protein shakes - oob/pulm toilet; encouraged ambulation in halls. Continue therapies - rec SNF - Stable for discharge to SNF from surgical standpoint.   FEN: reg diet, Boost/Ensure, bowel regimen ID: zosyn 11/19>>12/3, unasyn 12/3>>12/5, augmentin 12/5>>12/10 VTE: restarting xarelto   AKI, creatinine improving - nephrology s/o Syncope COPD/Chronic resp failure - on 2 lpm at baseline HTN Atrial flutter on xarelto - hold for anemia H/o CVA PVD Tobacco use    LOS: 21 days    Franne Forts, [facility] 05/21/2023, 8:43 AM Please see Amion for pager number during day hours 7:00am-4:30pm

## 2023-05-21 NOTE — TOC Transition Note (Signed)
Transition of Care [facility]) - CM/SW Discharge Note   Patient Details  Name: Jared [name] MRN: [id number] Date of Birth: 1955-10-30  [facility] CM/SW Contact:  Mearl Latin, LCSW Phone Number: 05/21/2023, 11:31 AM   Clinical Narrative:    [facility] will [facility] to: [facility] Anticipated DC date: 05/21/23 Family notified: SisterAdela Lank Transport by: Sharin Mons   Per MD patient ready for DC to [place]. RN to call report prior to discharge [phone] room 212B). RN, patient, patient's family, and facility notified of DC. Discharge Summary and FL2 sent to facility. DC packet on chart including signed script. Ambulance transport requested for patient.   CSW will sign off for now as social work intervention is no longer needed. Please consult [place] again if new needs arise.     Final next level of care: Skilled Nursing Facility Barriers to Discharge: Barriers Resolved   Patient Goals and CMS Choice CMS Medicare.gov Compare Post Acute Care list provided to:: Patient Choice offered to / list presented to : Patient, Sibling  Discharge Placement     Existing PASRR number confirmed : 05/21/23          Patient [facility] Patient to be transferred to facility by: [facility] Name of family member notified: Sister Patient and family notified of of transfer: 05/21/23  Discharge Plan and Services Additional resources added to the After Visit Summary for   In-house Referral: Clinical Social Work Discharge Planning Services: CM Consult Post Acute Care Choice: Skilled Nursing Facility          DME Arranged: N/A         HH Arranged: NA          Social Determinants of Health (SDOH) Interventions SDOH Screenings   Food Insecurity: No Food Insecurity (04/30/2023)  Housing: Low Risk  (04/30/2023)  Transportation Needs: No Transportation Needs (04/30/2023)  Utilities: Not At Risk (04/30/2023)  Alcohol Screen: Low Risk  (02/06/2023)  Depression (PHQ2-9): Low  Risk  (02/04/2023)  Recent Concern: Depression (PHQ2-9) - High Risk (01/09/2023)  Financial Resource Strain: Medium Risk (02/06/2023)  Physical Activity: Insufficiently Active (02/06/2023)  Social Connections: Moderately Integrated (02/06/2023)  Stress: No Stress Concern Present (02/06/2023)  Tobacco Use: High Risk (05/05/2023)  Health Literacy: Adequate Health Literacy (02/06/2023)     Readmission Risk Interventions    12/27/2022    2:20 PM 12/14/2021    1:38 PM 02/01/2021    3:22 PM  Readmission Risk Prevention Plan  Post Dischage Appt Complete Complete   Medication Screening Complete Complete   Transportation Screening Complete Complete Complete  PCP or Specialist Appt within 5-7 Days   Complete  Home Care Screening   Complete  Medication Review (RN CM)   Complete

## 2023-05-21 NOTE — Discharge Summary (Addendum)
PATIENT DETAILS Name: Jared [name] Age: 67 y.o. Sex: male Date of Birth: [birth year] MRN: [id number]. Admitting Physician: Anselm Jungling, DO [id number], Odette Horns, MD  Admit Date: 04/30/2023 Discharge date: 05/21/2023  Recommendations for Outpatient Follow-up:  Follow up with PCP in 1-2 weeks Please obtain CMP/CBC in one week Ensure follow-up with cardiology  Admitted From:  Home  Disposition: Skilled nursing facility   Discharge Condition: good  CODE STATUS:   Code Status: Full Code   Diet recommendation:  Diet Order             Diet regular Fluid consistency: Thin  Diet effective now           Diet - low sodium heart healthy                    Brief Summary: Patient is a 67 y.o.  male with history of COPD on 2 L of oxygen at home, A-fib, HTN, chronic combined systolic/diastolic CHF-who presented with abdominal/testicular pain-found to have sepsis due to acute appendicitis with perforation.   Significant events: 11/19>> admit to [facility] 11/20>> laparoscopic appendectomy 11/24>> transgluteal pelvic drain placement-respiratory distress postprocedure-transferred to ICU. 12/03>> drain injection/manipulation and drain exchange to place catheter in the abscess. 12/10>> both drains removed-by general surgery and IR.   Significant studies: 11/19>> scrotal ultrasound/Doppler ultrasound of testicle: Unremarkable 11/19>> CT head: No acute findings 11/19>> CT abdomen/pelvis: Acute perforated appendicitis. 11/21>> VQ scan: PE absent 11/21>> echo: EF 55-60%, grade 2 diastolic dysfunction, RV systolic function moderately reduced, vague density in the IVC-cannot rule out thrombus 11/21>> IVC/iliac Doppler: Poor study 11/22>> bilateral lower extremity Doppler: No DVT 11/23>> CT abdomen/pelvis: Deep pelvic abscess 12/02>> CT abdomen/pelvis: Decreased volume of pelvic abscess-pigtail drainage catheter is noted along the right inferior margin of this fluid collection. 12/09>> CT  abdomen/pelvis: No abscess   Significant microbiology data: 11/19>> COVID/influenza/RSV PCR: Negative 11/19>> blood culture: No growth 11/24>> pelvic abscess culture: E. coli/Klebsiella   Procedures: 11/24>> CT-guided transgluteal pelvic drain placement by IR 12/03>> Drain injection, drain manipulation and drain exchange  12/09>> EGD: AVM-no bleeding-proximal jejunum.   Consults: Cardiology General Surgery IR GI  Brief Hospital Course: Sepsis due to acute appendicitis with perforation s/p laparoscopic appendectomy 11/20-with development of residual deep pelvic abscess requiring drain placement by IR on 11/24 and drain exchange on 12/3 Long hospital course-on multiple antibiotics-stable-repeat CT on 12/9 without any collection-both drains removed by general surgery and IR on 12/10 no need for further antibiotics per general surgery-okay to discharge to SNF today.   Acute on chronic hypoxic respiratory failure Respiratory decompensation after lying prone for drain placement on 11/24-briefly required NRB-and close ICU monitoring. Currently back on usual home regimen of 2-3 L.   Upper GI bleeding Multifactorial normocytic anemia (secondary to acute blood loss and acute illness) EGD on 12/9-jejunal AVM-nonbleeding Okay to resume Xarelto per GI Hb stable-was transfused 1 unit of PRBC during this hospitalization-was done primarily because of his multiple cardiac issues. 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   Atrial flutter Rate controlled Beta-blocker Xarelto   NSVT Beta-blocker    HTN Stable Metoprolol/Imdur/hydralazine   HLD Statin   PAD Statin Continue to hold aspirin-do not  think we should do aspirin with Xarelto-given GI bleeding/AVMs.   History of recent syncope Chest pain No chest pain this morning Cardiology planning outpatient monitor   Mood disorder Cymbalta/Neurontin   Tobacco abuse Transdermal nicotine   Debility/deconditioning PT/OT eval-SNF recommended   Nutrition Status: Nutrition Problem: Moderate Malnutrition Etiology: acute illness Signs/Symptoms: energy intake < 75% for > 7 days, mild muscle depletion, mild fat depletion Interventions: Refer to RD note for recommendations, MVI, Ensure Enlive (each supplement provides 350kcal and 20 grams of protein), Boost Breeze   Obesity: Estimated body mass index is 30.34 kg/m as calculated from the following:   Height as of this encounter: 6\' 3"  (1.905 m).   Weight as of this encounter: 110.1 kg.   Discharge Diagnoses:  Principal Problem:   Acute perforated appendicitis Active Problems:   Syncope and collapse   Chronic respiratory failure with hypoxia (HCC)   Essential hypertension   Atrial flutter (HCC)   Tobacco use   Anemia due to chronic blood loss   AVM (arteriovenous malformation) of stomach, acquired   AKI (acute kidney injury) (HCC)   Peripheral vascular disease (HCC)   PAF (paroxysmal atrial fibrillation) (HCC)   Sepsis (HCC)   History of CVA (cerebrovascular accident)   Malnutrition of moderate degree   Abnormal echocardiogram   Atrial fibrillation with RVR (HCC)   HFrEF (heart failure with reduced ejection fraction) (HCC)   Heart failure with improved ejection fraction (HFimpEF) (HCC)   Melena   Chronic anticoagulation   Acute blood loss anemia   Heme positive stool   NSVT (nonsustained ventricular tachycardia) (HCC)   Discharge Instructions:  Activity:  As tolerated with Full fall precautions use walker/cane & assistance as needed  Discharge Instructions      Call MD for:  extreme fatigue   Complete by: As directed    Call MD for:  persistant dizziness or light-headedness   Complete by: As directed    Call MD for:  severe uncontrolled pain   Complete by: As directed    Diet - low sodium heart healthy   Complete by: As directed    Discharge instructions   Complete by: As directed    Follow with Primary MD  [name], MD in 1-2 weeks  Please get a complete blood count and chemistry panel checked by your Primary MD at your next visit, and again as instructed by your Primary MD.  Get Medicines reviewed and adjusted: Please take all your medications with you for your next visit with your Primary MD  Laboratory/radiological data: Please request your Primary MD to go over all hospital tests and procedure/radiological results at the follow up, please ask your Primary MD to get all Hospital records sent to his/her office.  In some cases, they will be blood work, cultures and biopsy results pending at the time of your discharge. Please request that your primary care M.D. follows up on these results.  Also Note the following: If you experience worsening of your admission symptoms, develop shortness of breath, life threatening emergency, suicidal or homicidal thoughts you must seek medical attention immediately by calling 911 or calling your MD immediately  if symptoms less severe.  You must read complete instructions/literature along with all the possible adverse reactions/side effects for all the Medicines you take and that have been prescribed to you. Take any new Medicines after you have completely understood and accpet all the possible adverse reactions/side effects.   Do not drive when taking Pain medications or sleeping medications (  Benzodaizepines)  Do not take more than prescribed Pain, Sleep and Anxiety Medications. It is not advisable to combine anxiety,sleep and pain medications without talking with your primary care  practitioner  Special Instructions: If you have smoked or chewed Tobacco  in the last 2 yrs please stop smoking, stop any regular Alcohol  and or any Recreational drug use.  Wear Seat belts while driving.  Please note: You were cared for by a hospitalist during your hospital stay. Once you are discharged, your primary care physician will handle any further medical issues. Please note that NO REFILLS for any discharge medications will be authorized once you are discharged, as it is imperative that you return to your primary care physician (or establish a relationship with a primary care physician if you do not have one) for your post hospital discharge needs so that they can reassess your need for medications and monitor your lab values.   Increase activity slowly   Complete by: As directed    No dressing needed   Complete by: As directed       Allergies as of 05/21/2023       Reactions   Lisinopril Cough        Medication List     STOP taking these medications    aspirin EC 81 MG tablet       TAKE these medications    Accu-Chek Guide test strip Generic drug: glucose blood Use to check blood sugar once daily.   Accu-Chek Guide w/Device Kit Use to check blood sugar once daily.   Accu-Chek Softclix Lancets lancets Use to check blood sugar once daily.   Acetaminophen Extra Strength 500 MG Tabs Take 2 tablets (1,000 mg total) by mouth every 8 (eight) hours as needed for moderate pain.   albuterol 108 (90 Base) MCG/ACT inhaler Commonly known as: Ventolin HFA INHALE 2 PUFFS INTO THE LUNGS EVERY 6 (SIX) HOURS AS NEEDED FOR WHEEZING OR SHORTNESS OF BREATH. What changed: when to take this   albuterol (2.5 MG/3ML) 0.083% nebulizer solution Commonly known as: PROVENTIL Take 3 mLs (2.5 mg total) by nebulization every 2 (two) hours as needed for wheezing or shortness of breath. What changed: You were already taking a medication with the same name, and this prescription was  added. Make sure you understand how and when to take each.   atorvastatin 80 MG tablet Commonly known as: LIPITOR Take 1 tablet (80 mg total) by mouth daily.   Breztri Aerosphere 160-9-4.8 MCG/ACT Aero Generic drug: Budeson-Glycopyrrol-Formoterol Take 2 puffs first thing in morning and then another 2 puffs about 12 hours later.   DULoxetine 60 MG capsule Commonly known as: Cymbalta Take 1 capsule (60 mg total) by mouth daily. For chronic leg pains   ezetimibe 10 MG tablet Commonly known as: ZETIA Take 1 tablet (10 mg total) by mouth daily.   feeding supplement Liqd Take 237 mLs by mouth 2 (two) times daily between meals.   ferrous sulfate 325 (65 FE) MG tablet Commonly known as: FeroSul Take 1 tablet (325 mg total) by mouth 2 (two) times daily with a meal. What changed: when to take this   folic acid 1 MG tablet Commonly known as: FOLVITE Take 1 tablet (1 mg total) by mouth daily.   furosemide 40 MG tablet Commonly known as: LASIX Take 1 tablet (40 mg total) by mouth daily. What changed:  how much to take when to take this reasons to take this   gabapentin 300 MG capsule Commonly  known as: NEURONTIN Take 1 capsule (300 mg total) by mouth 2 (two) times daily.   hydrALAZINE 50 MG tablet Commonly known as: APRESOLINE Take 1 tablet (50 mg total) by mouth every 8 (eight) hours.   isosorbide mononitrate 30 MG 24 hr tablet Commonly known as: IMDUR Take 1 tablet (30 mg total) by mouth daily.   metoprolol succinate 50 MG 24 hr tablet Commonly known as: TOPROL-XL Take 1.5 tablets (75 mg total) by mouth daily. Take with or immediately following a meal. What changed: how much to take   multivitamin with minerals Tabs tablet Take 1 tablet by mouth daily. Start taking on: May 22, 2023   nitroGLYCERIN 0.4 MG SL tablet Commonly known as: NITROSTAT Place 1 tablet (0.4 mg total) under the tongue every 5 (five) minutes as needed for chest pain.   oxyCODONE 5 MG  immediate release tablet Commonly known as: Oxy IR/ROXICODONE Take 1-2 tablets (5-10 mg total) by mouth every 6 (six) hours as needed for moderate pain (pain score 4-6) or severe pain (pain score 7-10) (5 mg for 4-6/10 pain. 10 mg for >6/10 pain).   OXYGEN Inhale 2 L/min into the lungs continuous.   pantoprazole 40 MG tablet Commonly known as: Protonix Take 1 tablet (40 mg total) by mouth 2 (two) times daily before a meal. What changed: when to take this   polyethylene glycol 17 g packet Commonly known as: MIRALAX / GLYCOLAX Take 17 g by mouth daily.   Vitamin D (Ergocalciferol) 1.25 MG (50000 UNIT) Caps capsule Commonly known as: Drisdol Take 1 capsule (50,000 Units total) by mouth once a week.   Xarelto 20 MG Tabs tablet Generic drug: rivaroxaban Take 1 tablet (20 mg total) by mouth daily with supper.               Discharge Care Instructions  (From admission, onward)           Start     Ordered   05/21/23 0000  No dressing needed        05/21/23 1017            Contact information for follow-up providers     [facility] Follow up.   Specialty: Radiology Why: Scheduler will arrange follow-up appt Contact information: [address] [place] [address] [phone]        Emelia Loron, MD. Schedule an appointment as soon as possible for a visit in 2 week(s).   Specialty: General Surgery Contact information: [address] [place] [address] [phone]         Sharlene Dory, PA-C Follow up.   Specialty: Cardiology Why: Thursday Jun 20, 2023 Arrive by 8:10 AMAppt at 8:25 AM (25 min) Contact information: [address] [place] [address] [phone]         [name], MD. Schedule an appointment as soon as possible for a visit in 1 week(s).   Specialty: Family Medicine Contact information: [address] [place] [address] [place]  [address] [phone]              Contact information for after-discharge care     Destination     [facility] .   Service: Skilled Nursing Contact information: [address] [place] [address] [phone]                    Allergies  Allergen Reactions   Lisinopril Cough  Other Procedures/Studies: CT ABDOMEN PELVIS W CONTRAST  Result Date: 05/20/2023 CLINICAL DATA:  Abdominal pain EXAM: CT ABDOMEN AND PELVIS WITH CONTRAST TECHNIQUE: Multidetector CT imaging of the abdomen and pelvis was performed using the standard protocol following bolus administration of intravenous contrast. RADIATION DOSE REDUCTION: This exam was performed according to the departmental dose-optimization program which includes automated exposure control, adjustment of the mA and/or kV according to patient size and/or use of iterative reconstruction technique. CONTRAST:  80mL OMNIPAQUE IOHEXOL 350 MG/ML SOLN COMPARISON:  05/13/2023 FINDINGS: Lower chest: No acute abnormality. Hepatobiliary: No focal liver abnormality is seen. No gallstones, gallbladder wall thickening, or biliary dilatation. Pancreas: Unremarkable. No pancreatic ductal dilatation or surrounding inflammatory changes. Spleen: Normal in size without focal abnormality. Adrenals/Urinary Tract: Adrenal glands are within normal limits. Kidneys demonstrate no renal calculi or obstructive changes. The bladder is within normal limits. Stomach/Bowel: No obstructive or inflammatory changes of the colon are seen. Inflammatory changes are noted adjacent to the cecum consistent with the history of recent appendectomy. No definitive abscess is seen. No extravasation of contrast is noted. Small bowel and stomach are within normal limits. Vascular/Lymphatic: Aortic atherosclerosis. No enlarged abdominal or pelvic lymph nodes. Reproductive: Prostate is unremarkable. Other: Previously seen fluid collection anterior to the rectum  is noted and is smaller in size following repositioning the drainage catheter. No significant residual fluid collection is noted. No new abscess is seen. Postsurgical drain is also noted and stable. Musculoskeletal: No acute or significant osseous findings. IMPRESSION: Changes consistent with prior appendectomy. Persistent inflammatory changes are seen adjacent to the cecum. No abscess or extravasation is noted. Repositioning of the drainage catheter right hemipelvis with decompression of previously seen residual air-fluid collection. No new focal abnormality is noted. Electronically Signed   By: Alcide Clever M.D.   On: 05/20/2023 23:47   IR Catheter Tube Change  Result Date: 05/14/2023 INDICATION: History appendectomy and postoperative pelvic abscess collection. CT-guided transgluteal drain was placed on 05/05/2023. Recent CT demonstrates small residual pelvic collection and the drain is not optimally positioned. EXAM: 1. Drain injection 2. Drain exchange with fluoroscopy COMPARISON:  None Available. MEDICATIONS: Moderate sedation ANESTHESIA/SEDATION: Moderate (conscious) sedation was employed during this procedure. A total of Versed 1 mg and Fentanyl 50 mcg was administered intravenously by the radiology nurse. Total intra-service moderate Sedation Time: 13 minutes. The patient's level of consciousness and vital signs were monitored continuously by radiology nursing throughout the procedure under my direct supervision. COMPLICATIONS: None immediate. FLUOROSCOPY: Radiation Exposure Index (as provided by the fluoroscopic device): 14 mGy Kerma TECHNIQUE: Informed written consent was obtained from the patient after a thorough discussion of the procedural risks, benefits and alternatives. All questions were addressed. Maximal Sterile Barrier Technique was utilized including caps, mask, sterile gowns, sterile gloves, sterile drape, hand hygiene and skin antiseptic. A timeout was performed prior to the initiation of  the procedure. PROCEDURE: Patient was placed prone. The right transgluteal drain and surrounding skin were prepped and draped in sterile fashion. Drain was injected with contrast. Skin around the drain was anesthetized using 1% lidocaine. The catheter was cut and removed over a Bentson wire. Kumpe catheter was manipulated into the cephalad portion of the collection. The Kumpe catheter was exchanged for a 10 [place] multipurpose drain. 5 mL of bloody purulent fluid was aspirated. Additional contrast was injected to confirm placement. Drain was flushed with saline and attached to a suction bulb. Drain was sutured to the skin. Fluoroscopic images were taken and saved for this procedure. IMPRESSION:  Successful exchange and repositioning of the right transgluteal drain using fluoroscopy. Patient now has a 10 [place] multipurpose drain. Electronically Signed   By: Richarda Overlie M.D.   On: 05/14/2023 17:24   CT ABDOMEN PELVIS W CONTRAST  Result Date: 05/13/2023 CLINICAL DATA:  Follow-up perforated appendicitis. EXAM: CT ABDOMEN AND PELVIS WITH CONTRAST TECHNIQUE: Multidetector CT imaging of the abdomen and pelvis was performed using the standard protocol following bolus administration of intravenous contrast. RADIATION DOSE REDUCTION: This exam was performed according to the departmental dose-optimization program which includes automated exposure control, adjustment of the mA and/or kV according to patient size and/or use of iterative reconstruction technique. CONTRAST:  75mL OMNIPAQUE IOHEXOL 350 MG/ML SOLN COMPARISON:  05/04/2023 FINDINGS: Lower chest: Small right pleural effusion. Hepatobiliary: No focal liver abnormality is seen. No gallstones, gallbladder wall thickening, or biliary dilatation. Pancreas: Unremarkable. No pancreatic ductal dilatation or surrounding inflammatory changes. Spleen: Normal in size without focal abnormality. Adrenals/Urinary Tract: Normal adrenal glands. No nephrolithiasis, hydronephrosis or  suspicious mass. Urinary bladder appears normal. Stomach/Bowel: Stomach appears normal. Inflammatory fat stranding identified within the right lower quadrant of the abdomen. The appendix is surgically absent. Several small foci of extraluminal gas identified adjacent to the cecum, image 68/3 and image 66/3. Increased caliber of the small bowel loops is again noted which appears stable to improved in the interval, likely reflecting ileus. Surgical drainage catheter is again seen entering from of the left lower quadrant approach. The tip of the catheter terminates along the right pericolic gutter adjacent to the inferior margin of the right lobe of liver. The previously noted crescent shaped fluid collection within the posterior pelvis, anterior to the rectum, has decreased in volume from previous exam. On today's study this measures 4.9 x 2.0 by 1.4 cm (volume = 7.2 cm^3), image 77/3 and image 55/6. The percutaneous, pigtail drainage catheter placed on 05/05/2023 is noted along the right inferior margin of this fluid collection, image 77/3. Formally this measured 6.8 x 3.8 by 3.9 cm (volume = 53 cm^3). As before there is a tubular shaped fluid collection extending along the right pelvic sidewall measuring 2.4 x 1.7 by 5.8 cm (volume = 12 cm^3), image 82/7 and image 69/3. On the previous exam this measured 2.8 by 1.6 by 7.2 cm (volume = 17 cm^3). No new fluid collections identified. Vascular/Lymphatic: Aortic atherosclerosis without aneurysm. No abdominopelvic adenopathy. Reproductive: Prostate is unremarkable. Other: Postoperative changes identified along the midline of the ventral abdominal wall. Small punctate foci of gas within the subcutaneous soft tissues noted compatible with postop change. No fluid collection identified within the abdominal wall. Musculoskeletal: Lumbar degenerative disc disease. No acute or suspicious osseous findings. IMPRESSION: 1. Interval decrease in volume of crescent shaped fluid  collection within the posterior pelvis, anterior to the rectum. The percutaneous, pigtail drainage catheter placed on 05/05/2023 is noted along the right inferior margin of this fluid collection. 2. Slight decrease size of tubular shaped fluid collection extending along the right pelvic sidewall measuring 2.4 x 1.7 by 5.8 cm. 3. Persistent increased caliber of the small bowel loops is again noted which appears stable to improved in the interval, likely reflecting ileus. 4. Small right pleural effusion. 5.  Aortic Atherosclerosis (ICD10-I70.0). Electronically Signed   By: Signa Kell M.D.   On: 05/13/2023 14:02   DG Chest Port 1 View  Result Date: 05/11/2023 CLINICAL DATA:  Dyspnea. EXAM: PORTABLE CHEST 1 VIEW COMPARISON:  May 07, 2023. FINDINGS: Stable cardiomediastinal silhouette. Left lung is clear. Minimal  right basilar subsegmental atelectasis is noted. The thorax is unremarkable. IMPRESSION: Minimal right basilar subsegmental atelectasis. Electronically Signed   By: Lupita Raider M.D.   On: 05/11/2023 15:27   DG CHEST PORT 1 VIEW  Result Date: 05/07/2023 CLINICAL DATA:  Wheezing, nasal congestion EXAM: PORTABLE CHEST 1 VIEW COMPARISON:  05/05/2023 FINDINGS: 2 frontal views of the chest demonstrate a stable cardiac silhouette. No acute airspace disease, effusion, or pneumothorax. Improved interstitial prominence at the lung bases, with residual densities at the right lung base. Persistent central pulmonary vascular congestion. No acute bony abnormalities. IMPRESSION: 1. Persistent vascular congestion, with marked improvement in bibasilar interstitial prominence seen previously consistent with resolving interstitial edema. Electronically Signed   By: Sharlet Salina M.D.   On: 05/07/2023 09:22   DG CHEST PORT 1 VIEW  Result Date: 05/05/2023 CLINICAL DATA:  Shortness of breath. EXAM: PORTABLE CHEST 1 VIEW COMPARISON:  05/02/2023 FINDINGS: Right apex obscured by the patient's face. Vascular  congestion with interstitial opacity at the bases, potentially dependent edema. No evidence for pneumothorax or focal consolidation. No substantial pleural effusion. Cardiopericardial silhouette is at upper limits of normal for size. Telemetry leads overlie the chest. IMPRESSION: Vascular congestion with interstitial opacity at the bases, potentially dependent edema. Electronically Signed   By: Kennith Center M.D.   On: 05/05/2023 14:09   CT GUIDED PERITONEAL/RETROPERITONEAL FLUID DRAIN BY PERC CATH  Result Date: 05/05/2023 INDICATION: 67 year old male status post appendectomy complicated postoperatively by pelvic fluid collection concerning for abscess. EXAM: CT PERC DRAIN PERITONEAL ABCESS COMPARISON:  CT abdomen pelvis from 05/04/2023 MEDICATIONS: The patient is currently admitted to the hospital and receiving intravenous antibiotics. The antibiotics were administered within an appropriate time frame prior to the initiation of the procedure. ANESTHESIA/SEDATION: Moderate (conscious) sedation was employed during this procedure. A total of Versed mg and Fentanyl mcg was administered intravenously. Moderate Sedation Time: 10 minutes. The patient's level of consciousness and vital signs were monitored continuously by radiology nursing throughout the procedure under my direct supervision. CONTRAST:  None COMPLICATIONS: None immediate. PROCEDURE: RADIATION DOSE REDUCTION: This exam was performed according to the departmental dose-optimization program which includes automated exposure control, adjustment of the mA and/or kV according to patient size and/or use of iterative reconstruction technique. Informed written consent was obtained from the patient and family member after a discussion of the risks, benefits and alternatives to treatment. The patient was placed prone on the CT gantry and a pre procedural CT was performed re-demonstrating the known abscess/fluid collection within the pelvis. The procedure was  planned. A timeout was performed prior to the initiation of the procedure. The right gluteal region was prepped and draped in the usual sterile fashion. The overlying soft tissues were anesthetized with 1% lidocaine with epinephrine. Appropriate trajectory was planned with the use of a 22 gauge spinal needle. An 18 gauge trocar needle was advanced into the abscess/fluid collection and a short Amplatz super stiff wire was coiled within the collection. Appropriate positioning was confirmed with a limited CT scan. The tract was serially dilated allowing placement of a 8 [place] all-purpose drainage catheter. Appropriate positioning was confirmed with a limited postprocedural CT scan. Proximally 75 ml of purulent fluid was aspirated. The tube was connected to a bulb suction and sutured in place. A dressing was placed. The patient tolerated the procedure well without immediate post procedural complication. IMPRESSION: Successful CT guided placement of a right transgluteal 8 [place] all purpose drain catheter into the pelvic abscess with aspiration of approximately  75 mL of purulent fluid. Samples were sent to the laboratory as requested by the ordering clinical team. Marliss Coots, MD [facility] Radiology Electronically Signed   By: Marliss Coots M.D.   On: 05/05/2023 14:00   CT ABDOMEN PELVIS WO CONTRAST  Result Date: 05/04/2023 CLINICAL DATA:  Peritonitis, bowel perforation, right lower quadrant abdominal pain EXAM: CT ABDOMEN AND PELVIS WITHOUT CONTRAST TECHNIQUE: Multidetector CT imaging of the abdomen and pelvis was performed following the standard protocol without IV contrast. RADIATION DOSE REDUCTION: This exam was performed according to the departmental dose-optimization program which includes automated exposure control, adjustment of the mA and/or kV according to patient size and/or use of iterative reconstruction technique. COMPARISON:  04/30/2023 FINDINGS:  Lower chest: Advanced emphysema noted within the visualized lung bases. Small right pleural effusion has developed. Hypoattenuation of the cardiac blood pool is in keeping with at least mild anemia. Moderate right coronary artery calcification noted. Hepatobiliary: No focal liver abnormality is seen. No gallstones, gallbladder wall thickening, or biliary dilatation. Pancreas: Unremarkable. No pancreatic ductal dilatation or surrounding inflammatory changes. Spleen: Normal in size without focal abnormality. Adrenals/Urinary Tract: Adrenal glands are unremarkable. Kidneys are normal, without renal calculi, focal lesion, or hydronephrosis. Bladder is unremarkable. Stomach/Bowel: Interval appendectomy. Left lower quadrant surgical drainage catheter loops within the deep pelvis and courses along the right peritoneal wall the level of the mid abdomen. There is extensive infiltration within the surgical bed in keeping with residual inflammatory or postsurgical change. There is a crescentic loculated fluid collection within the deep pelvis extending to this area of inflammatory change superiorly containing several punctate locules of gas suspicious for a developing deep pelvic abscess measuring at least 3.8 x 3.9 x 6.5 cm in dimension on axial image # 76 and coronal image # 60. There is mild infiltration of the peritoneal fat within the right mid abdomen and pelvis as well as enhancement of the peritoneal lining which may reflect changes of peritonitis. The proximal and mid small bowel appears diffusely mildly dilated and fluid-filled without a discrete point of transition identified in keeping with changes of an adynamic ileus. No free intraperitoneal gas or fluid. Vascular/Lymphatic: Extensive aortoiliac atherosclerotic calcification. No aortic aneurysm. No pathologic adenopathy within the abdomen and pelvis. Reproductive: Prostate is unremarkable. Other: There is extensive infiltration and punctate foci of subcutaneous  gas in the region of the umbilicus likely reflecting postsurgical changes following laparoscopic surgery. No discrete subcutaneous fluid collection identified. No hernia identified. Musculoskeletal: No acute bone abnormality. No lytic or blastic bone lesion. IMPRESSION: 1. Interval appendectomy. Crescentic fluid collection within the deep pelvis extending to this area of inflammatory change within the surgical bed containing several punctate locules of gas suspicious for a developing deep pelvic abscess measuring at least 3.8 x 3.9 x 6.5 cm in dimension. This does not communicate with the indwelling surgical drainage catheter. 2. Infiltration of peritoneal fat and peritoneal enhancement in keeping changes of peritonitis. 3. Postoperative adynamic ileus with multiple loops of dilated fluid-filled proximal and mid small bowel. 4. Small right pleural effusion. 5. Moderate right coronary artery calcification. Aortic Atherosclerosis (ICD10-I70.0) and Emphysema (ICD10-J43.9). Electronically Signed   By: Helyn Numbers M.D.   On: 05/04/2023 22:03   VAS [place] LOWER EXTREMITY VENOUS (DVT)  Result Date: 05/03/2023  Lower Venous DVT Study Patient Name:  ARASH HEAP  Date of Exam:   05/03/2023 Medical Rec #: [id number]     Accession #:    [id number] Date of Birth: [birth year]  Patient Gender: M Patient Age:   73 years Exam Location:  [facility] Procedure:      VAS [place] LOWER EXTREMITY VENOUS (DVT) Referring Phys: Zenia Resides --------------------------------------------------------------------------------  Indications: Swelling, and Edema.  Risk Factors: Surgery Appendectomy 05/01/23. Comparison Study: No prior study Performing Technologist: Shona Simpson  Examination Guidelines: A complete evaluation includes B-mode imaging, spectral Doppler, color Doppler, and power Doppler as needed of all accessible portions of each vessel. Bilateral testing is considered an integral part of a complete examination. Limited  examinations for reoccurring indications may be performed as noted. The reflux portion of the exam is performed with the patient in reverse Trendelenburg.  +---------+---------------+---------+-----------+----------+--------------+ RIGHT    CompressibilityPhasicitySpontaneityPropertiesThrombus Aging +---------+---------------+---------+-----------+----------+--------------+ CFV      Full           Yes      Yes                                 +---------+---------------+---------+-----------+----------+--------------+ SFJ      Full                                                        +---------+---------------+---------+-----------+----------+--------------+ FV Prox  Full                                                        +---------+---------------+---------+-----------+----------+--------------+ FV Mid   Full                                                        +---------+---------------+---------+-----------+----------+--------------+ FV DistalFull                                                        +---------+---------------+---------+-----------+----------+--------------+ PFV      Full                                                        +---------+---------------+---------+-----------+----------+--------------+ POP      Full           Yes      Yes                                 +---------+---------------+---------+-----------+----------+--------------+ PTV      Full                                                        +---------+---------------+---------+-----------+----------+--------------+  PERO     Full                                                        +---------+---------------+---------+-----------+----------+--------------+   +---------+---------------+---------+-----------+----------+--------------+ LEFT     CompressibilityPhasicitySpontaneityPropertiesThrombus Aging  +---------+---------------+---------+-----------+----------+--------------+ CFV      Full           Yes      Yes                                 +---------+---------------+---------+-----------+----------+--------------+ SFJ      Full                                                        +---------+---------------+---------+-----------+----------+--------------+ FV Prox  Full                                                        +---------+---------------+---------+-----------+----------+--------------+ FV Mid   Full                                                        +---------+---------------+---------+-----------+----------+--------------+ FV DistalFull                                                        +---------+---------------+---------+-----------+----------+--------------+ PFV      Full                                                        +---------+---------------+---------+-----------+----------+--------------+ POP      Full           Yes      Yes                                 +---------+---------------+---------+-----------+----------+--------------+ PTV      Full                                                        +---------+---------------+---------+-----------+----------+--------------+ PERO     Full                                                        +---------+---------------+---------+-----------+----------+--------------+  Summary: BILATERAL: - No evidence of deep vein thrombosis seen in the lower extremities, bilaterally. -No evidence of popliteal cyst, bilaterally.   *See table(s) above for measurements and observations. Electronically signed by Gerarda Fraction on 05/03/2023 at 3:40:10 PM.    Final    Overnight EEG with video  Result Date: 05/03/2023 Charlsie Quest, MD     05/03/2023  7:38 PM Patient Name: Dhyey Mcgee MRN: [id number] Epilepsy Attending: Charlsie Quest Referring Physician/Provider: Gevena Mart, NP Duration: 05/02/2023 1937 to 05/03/2023 1550 Patient history: 67 yo M with arm tremors, hx of tremors with LOC, frequency increased in the last 3 months, had episode today without LOC. EEG to evaluate for seizure. Level of alertness: Awake, asleep AEDs during EEG study: GBP Technical aspects: This EEG study was done with scalp electrodes positioned according to the 10-20 International system of electrode placement. Electrical activity was reviewed with band pass filter of 1-70Hz , sensitivity of 7 uV/mm, display speed of 59mm/sec with a 60Hz  notched filter applied as appropriate. EEG data were recorded continuously and digitally stored.  Video monitoring was available and reviewed as appropriate. Description: The posterior dominant rhythm consists of 8 Hz activity of moderate voltage (25-35 uV) seen predominantly in posterior head regions, symmetric and reactive to eye opening and eye closing. Sleep was characterized by vertex waves, sleep spindles (12 to 14 Hz), maximal frontocentral region.  Event button was pressed over 50 times after 05/03/2023 0416 for tremors of the upper extremity. Concomitant EEG before, during and after the event showed normal posterior dominant rhythm, did not show any EEG changes suggest seizure. Hyperventilation and photic stimulation were not performed.   IMPRESSION: This study is within normal limits. No seizures or epileptiform discharges were seen throughout the recording. Event button was pressed several times during the study for tremors of upper extremity without concomitant EEG change.  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 Charlsie Quest   NM Pulmonary Perf and Vent  Result Date: 05/02/2023 CLINICAL DATA:  Evaluate for pulmonary embolism. EXAM: NUCLEAR MEDICINE PERFUSION LUNG SCAN TECHNIQUE: Perfusion images were obtained in multiple projections after intravenous injection of radiopharmaceutical. Ventilation scans intentionally deferred if perfusion scan and chest  x-ray adequate for interpretation during COVID 19 epidemic. RADIOPHARMACEUTICALS:  4 mCi Tc-53m MAA IV COMPARISON:  Chest radiograph-earlier same day; 04/30/2023 Chest CT-07/12/2022 FINDINGS: Review of chest radiograph performed earlier same day demonstrates unchanged enlarged cardiac silhouette and mediastinal contours. Old left-sided rib fractures. No focal airspace opacities. No pleural effusion or pneumothorax. No evidence of edema Perfusion imaging demonstrates mild heterogeneous perfusion of the bilateral pulmonary parenchyma without discrete area of non perfusion to suggest pulmonary embolism. IMPRESSION: Pulmonary embolism absent. No discrete areas of non perfusion to suggest pulmonary embolism. Electronically Signed   By: Simonne Come M.D.   On: 05/02/2023 17:35   DG CHEST PORT 1 VIEW  Result Date: 05/02/2023 CLINICAL DATA:  Dyspnea EXAM: PORTABLE CHEST 1 VIEW COMPARISON:  None Available. FINDINGS: Normal mediastinum and cardiac silhouette. Normal pulmonary vasculature. No evidence of effusion, infiltrate, or pneumothorax. No acute bony abnormality. IMPRESSION: Normal chest radiograph Electronically Signed   By: Genevive Bi M.D.   On: 05/02/2023 16:32   VAS [place] IVC/ILIAC (VENOUS ONLY)  Result Date: 05/02/2023 IVC/ILIAC STUDY Patient Name:  CRIXUS HOUT  Date of Exam:   05/02/2023 Medical Rec #: [id number]     Accession #:    [id number] Date of Birth: 04-02-1956      Patient Gender: M Patient Age:   49  years Exam Location:  [facility] Procedure:      VAS [place] IVC/ILIAC (VENOUS ONLY) Referring Phys: [name] YATES --------------------------------------------------------------------------------  Indications: A vague density area of concern in the IVC was noted on echo exam. Limitations: Air/bowel gas and Status post appendectomy laparoscopic 05/01/23.  Performing Technologist: Marilynne Halsted RDMS, RVT  Examination Guidelines: A complete evaluation includes B-mode imaging, spectral Doppler,  color Doppler, and power Doppler as needed of all accessible portions of each vessel. Bilateral testing is considered an integral part of a complete examination. Limited examinations for reoccurring indications may be performed as noted.  IVC/Iliac Findings: +----------+------+--------+-----------------+    IVC    PatentThrombus    Comments      +----------+------+--------+-----------------+ IVC Prox                poorly visualized +----------+------+--------+-----------------+ IVC Mid                 poorly visualized +----------+------+--------+-----------------+ IVC Distal              not visualized    +----------+------+--------+-----------------+    Summary: IVC/Iliac: Visualization of proximal Inferior Vena Cava, mid inferior vena cava and distal Inferior Vena Cava was limited. 2D imaging with color evaluation of the IVC was non- diagnostic due to patient's body habitus and air in the abdominal cavity.  *See table(s) above for measurements and observations.  Electronically signed by Carolynn Sayers on 05/02/2023 at 4:10:22 PM.   Final    ECHOCARDIOGRAM COMPLETE  Result Date: 05/02/2023    ECHOCARDIOGRAM REPORT   Patient Name:   JERMARIUS KOSA Date of Exam: 05/02/2023 Medical Rec #:  [id number]    Height:       75.0 in Accession #:    [id number]   Weight:       219.0 lb Date of Birth:  Aug 01, 1955     BSA:          2.281 m Patient Age:    67 years     BP:           107/69 mmHg Patient Gender: M            HR:           104 bpm. Exam Location:  Inpatient Procedure: 2D Echo, Color Doppler, Cardiac Doppler and Intracardiac            Opacification Agent Indications:    R55 Syncope  History:        Patient has prior history of Echocardiogram examinations, most                 recent 12/13/2021. CAD, COPD, Arrythmias:Atrial Fibrillation; Risk                 Factors:Hypertension and Diabetes.  Sonographer:    Irving Burton Senior RDCS Referring Phys: [id number] Francena Hanly [facility]  Sonographer Comments: Very  poor echo windows due to body habitus and COPD IMPRESSIONS  1. Left ventricular ejection fraction, by estimation, is 55 to 60%. The left ventricle has normal function. The left ventricle has no regional wall motion abnormalities. Left ventricular diastolic parameters are consistent with Grade II diastolic dysfunction (pseudonormalization).  2. Right ventricular systolic function is moderately reduced. The right ventricular size is moderately enlarged. Tricuspid regurgitation signal is inadequate for assessing PA pressure.  3. Left atrial size was mildly dilated.  4. The mitral valve is degenerative. No evidence of mitral valve regurgitation. No evidence of mitral stenosis.  5. The aortic valve is  tricuspid. Aortic valve regurgitation is not visualized. Aortic valve sclerosis/calcification is present, without any evidence of aortic stenosis.  6. There is a vague density in the IVC. Cannot rule out thrombus. Recommend dedicated Abdominal CTA vs. Abdominal US for further evaluation. The inferior vena cava is dilated in size with >50% respiratory variability, suggesting right atrial pressure of  8 mmHg.  7. In setting of syncope as well as RV dysfunction, consider acute PE. Recommend Chest CTA if clinically indicated. FINDINGS  Left Ventricle: Left ventricular ejection fraction, by estimation, is 55 to 60%. The left ventricle has normal function. The left ventricle has no regional wall motion abnormalities. Definity contrast agent was given IV to delineate the left ventricular  endocardial borders. The left ventricular internal cavity size was normal in size. There is no left ventricular hypertrophy. Left ventricular diastolic parameters are consistent with Grade II diastolic dysfunction (pseudonormalization). Normal left ventricular filling pressure. Right Ventricle: The right ventricular size is moderately enlarged. No increase in right ventricular wall thickness. Right ventricular systolic function is moderately  reduced. Tricuspid regurgitation signal is inadequate for assessing PA pressure. Left Atrium: Left atrial size was mildly dilated. Right Atrium: Right atrial size was normal in size. Pericardium: There is no evidence of pericardial effusion. Presence of epicardial fat layer. Mitral Valve: The mitral valve is degenerative in appearance. There is mild calcification of the mitral valve leaflet(s). Mild to moderate mitral annular calcification. No evidence of mitral valve regurgitation. No evidence of mitral valve stenosis. Tricuspid Valve: The tricuspid valve is normal in structure. Tricuspid valve regurgitation is trivial. No evidence of tricuspid stenosis. Aortic Valve: The aortic valve is tricuspid. Aortic valve regurgitation is not visualized. Aortic valve sclerosis/calcification is present, without any evidence of aortic stenosis. Pulmonic Valve: The pulmonic valve was normal in structure. Pulmonic valve regurgitation is not visualized. No evidence of pulmonic stenosis. Aorta: The aortic root is normal in size and structure. Venous: There is a vague density in the IVC. Cannot rule out thrombus. Recommend dedicated Abdominal CTA vs. Abdominal US for further evaluation. The inferior vena cava is dilated in size with greater than 50% respiratory variability, suggesting right atrial pressure of 8 mmHg. IAS/Shunts: No atrial level shunt detected by color flow Doppler.  LEFT VENTRICLE PLAX 2D LVIDd:         4.90 cm     Diastology LVIDs:         3.80 cm     LV e' medial:    6.20 cm/s LV PW:         1.10 cm     LV E/e' medial:  10.9 LV IVS:        1.00 cm     LV e' lateral:   6.31 cm/s LVOT diam:     2.10 cm     LV E/e' lateral: 10.7 LV SV:         47 LV SV Index:   21 LVOT Area:     3.46 cm  LV Volumes (MOD) LV vol d, MOD A4C: 98.9 ml LV vol s, MOD A4C: 57.8 ml LV SV MOD A4C:     98.9 ml RIGHT VENTRICLE RV S prime:     9.36 cm/s TAPSE (M-mode): 1.9 cm LEFT ATRIUM             Index        RIGHT ATRIUM           Index LA  diam:  3.30 cm 1.45 cm/m   RA Area:     23.70 cm LA Vol (A2C):   80.6 ml 35.34 ml/m  RA Volume:   71.30 ml  31.26 ml/m LA Vol (A4C):   74.4 ml 32.62 ml/m LA Biplane Vol: 81.7 ml 35.82 ml/m  AORTIC VALVE LVOT Vmax:   88.10 cm/s LVOT Vmean:  65.100 cm/s LVOT VTI:    0.135 m  AORTA Ao Root diam: 3.40 cm MITRAL VALVE MV Area (PHT): 3.39 cm    SHUNTS MV Decel Time: 224 msec    Systemic VTI:  0.14 m MV E velocity: 67.30 cm/s  Systemic Diam: 2.10 cm MV A velocity: 60.80 cm/s MV E/A ratio:  1.11 Armanda Magic MD Electronically signed by Armanda Magic MD Signature Date/Time: 05/02/2023/12:22:10 PM    Final    DG Chest Port 1 View  Result Date: 04/30/2023 CLINICAL DATA:  Shortness of breath and headache EXAM: PORTABLE CHEST 1 VIEW COMPARISON:  12/26/2022 FINDINGS: Numerous leads and wires project over the chest. Remote left rib fractures. Midline trachea. Borderline cardiomegaly. Atherosclerosis in the transverse aorta. Left costophrenic angle excluded. No pleural effusion or pneumothorax. Chronic interstitial thickening/coarsening there is likely related to COPD/chronic bronchitis given clinical history of smoking. No lobar consolidation. IMPRESSION: No acute cardiopulmonary disease. Peribronchial thickening which may relate to chronic bronchitis or smoking. Aortic Atherosclerosis (ICD10-I70.0). Electronically Signed   By: Jeronimo Greaves M.D.   On: 04/30/2023 16:55   CT ABDOMEN PELVIS W CONTRAST  Result Date: 04/30/2023 CLINICAL DATA:  Right lower quadrant pain, no bowel movement since last Thursday, testicular pain EXAM: CT ABDOMEN AND PELVIS WITH CONTRAST TECHNIQUE: Multidetector CT imaging of the abdomen and pelvis was performed using the standard protocol following bolus administration of intravenous contrast. RADIATION DOSE REDUCTION: This exam was performed according to the departmental dose-optimization program which includes automated exposure control, adjustment of the mA and/or kV according to  patient size and/or use of iterative reconstruction technique. CONTRAST:  60mL OMNIPAQUE IOHEXOL 350 MG/ML SOLN COMPARISON:  11/18/2020 FINDINGS: Lower chest: Emphysema. No acute pleural or parenchymal lung disease. Hepatobiliary: No focal liver abnormality is seen. No gallstones, gallbladder wall thickening, or biliary dilatation. Pancreas: Unremarkable. No pancreatic ductal dilatation or surrounding inflammatory changes. Spleen: Normal in size without focal abnormality. Adrenals/Urinary Tract: Adrenal glands are unremarkable. Kidneys are normal, without renal calculi, focal lesion, or hydronephrosis. Bladder is unremarkable. Stomach/Bowel: There is a dilated inflamed appendix within the right lower quadrant, measuring up to 16 mm in diameter. And appendicolith is seen at the appendiceal orifice. Marked periappendiceal fat stranding with evidence of micro perforation at the base of the appendix. No fluid collection or abscess. No bowel obstruction or ileus. Vascular/Lymphatic: Subcentimeter right lower quadrant mesenteric lymph nodes are likely reactive. No pathologic adenopathy. Atherosclerosis of the aorta and its branches. Reproductive: Prostate is unremarkable. Other: Mesenteric edema and trace free fluid within the right lower quadrant. Punctate extraluminal gas adjacent to the base of the appendix consistent with micro perforation. No other evidence of pneumoperitoneum. No abdominal wall hernia. Musculoskeletal: No acute or destructive bony abnormalities. Reconstructed images demonstrate no additional findings. IMPRESSION: 1. Acute perforated appendicitis.  No fluid collection or abscess. 2.  Aortic Atherosclerosis (ICD10-I70.0). Critical Value/emergent results were called by telephone at the time of interpretation on 04/30/2023 at 4:29 pm to provider [facility], who verbally acknowledged these results. Electronically Signed   By: Sharlet Salina M.D.   On: 04/30/2023 16:32   US SCROTUM W/DOPPLER  Result  Date: 04/30/2023  CLINICAL DATA:  Left testicular pain EXAM: SCROTAL ULTRASOUND DOPPLER ULTRASOUND OF THE TESTICLES TECHNIQUE: Complete ultrasound examination of the testicles, epididymis, and other scrotal structures was performed. Color and spectral Doppler ultrasound were also utilized to evaluate blood flow to the testicles. COMPARISON:  11/18/2020 FINDINGS: Right testicle Measurements: 2.5 x 4.0 x 2.2 cm. No mass or microlithiasis visualized. Left testicle Measurements: 2.1 x 3.9 x 1.7 cm. No mass or microlithiasis visualized. Right epididymis: Incidental 3 mm epididymal cyst of doubtful clinical significance. Otherwise normal size and appearance. Left epididymis: Incidental 2 mm epididymal cyst of doubtful clinical significance. Otherwise normal size and appearance. Hydrocele:  None visualized. Varicocele:  None visualized. Pulsed Doppler interrogation of both testes demonstrates normal low resistance arterial and venous waveforms bilaterally. IMPRESSION: 1. Unremarkable testicular ultrasound. 2. Small bilateral epididymal cysts of likely no clinical significance. Electronically Signed   By: Sharlet Salina M.D.   On: 04/30/2023 16:11   CT Head Wo Contrast  Result Date: 04/30/2023 CLINICAL DATA:  frontal HA, sepsis EXAM: CT HEAD WITHOUT CONTRAST TECHNIQUE: Contiguous axial images were obtained from the base of the skull through the vertex without intravenous contrast. RADIATION DOSE REDUCTION: This exam was performed according to the departmental dose-optimization program which includes automated exposure control, adjustment of the mA and/or kV according to patient size and/or use of iterative reconstruction technique. COMPARISON:  None Available. FINDINGS: Brain: No hemorrhage. No hydrocephalus. No extra-axial fluid collection. No CT evidence of an acute cortical infarct. No mass effect. No mass lesion. Mineralization of the basal ganglia bilaterally. Vascular: No hyperdense vessel or unexpected  calcification. Skull: Normal. Negative for fracture or focal lesion. Sinuses/Orbits: No middle ear effusion. There is a trace left mastoid effusion. Paranasal sinuses are clear. Bilateral lens replacement. Orbits are otherwise unremarkable. Other: None. IMPRESSION: 1. No CT finding to explain frontal headache. 2. Trace left mastoid effusion. Electronically Signed   By: Lorenza Cambridge M.D.   On: 04/30/2023 14:44     TODAY-DAY OF DISCHARGE:  Subjective:   Gleen Revelo today has no headache,no chest abdominal pain,no new weakness tingling or numbness, feels much better wants to go home today.   Objective:   Blood pressure 122/67, pulse 77, temperature 98 F (36.7 C), temperature source Oral, resp. rate 18, height 6\' 3"  (1.905 m), weight 110.1 kg, SpO2 100%.  Intake/Output Summary (Last 24 hours) at 05/21/2023 1020 Last data filed at 05/21/2023 0438 Gross per 24 hour  Intake --  Output 1755 ml  Net -1755 ml   Filed Weights   05/12/23 0500 05/14/23 0400 05/15/23 0300  Weight: 97.7 kg 105 kg 110.1 kg    Exam: Awake Alert, Oriented *3, No new F.N deficits, Normal affect .AT,PERRAL Supple Neck,No JVD, No cervical lymphadenopathy appriciated.  Symmetrical Chest wall movement, Good air movement bilaterally, CTAB RRR,No Gallops,Rubs or new Murmurs, No Parasternal Heave +ve B.Sounds, Abd Soft, Non tender, No organomegaly appriciated, No rebound -guarding or rigidity. No Cyanosis, Clubbing or edema, No new Rash or bruise   PERTINENT RADIOLOGIC STUDIES: CT ABDOMEN PELVIS W CONTRAST  Result Date: 05/20/2023 CLINICAL DATA:  Abdominal pain EXAM: CT ABDOMEN AND PELVIS WITH CONTRAST TECHNIQUE: Multidetector CT imaging of the abdomen and pelvis was performed using the standard protocol following bolus administration of intravenous contrast. RADIATION DOSE REDUCTION: This exam was performed according to the departmental dose-optimization program which includes automated exposure control,  adjustment of the mA and/or kV according to patient size and/or use of iterative reconstruction technique. CONTRAST:  80mL OMNIPAQUE IOHEXOL  350 MG/ML SOLN COMPARISON:  05/13/2023 FINDINGS: Lower chest: No acute abnormality. Hepatobiliary: No focal liver abnormality is seen. No gallstones, gallbladder wall thickening, or biliary dilatation. Pancreas: Unremarkable. No pancreatic ductal dilatation or surrounding inflammatory changes. Spleen: Normal in size without focal abnormality. Adrenals/Urinary Tract: Adrenal glands are within normal limits. Kidneys demonstrate no renal calculi or obstructive changes. The bladder is within normal limits. Stomach/Bowel: No obstructive or inflammatory changes of the colon are seen. Inflammatory changes are noted adjacent to the cecum consistent with the history of recent appendectomy. No definitive abscess is seen. No extravasation of contrast is noted. Small bowel and stomach are within normal limits. Vascular/Lymphatic: Aortic atherosclerosis. No enlarged abdominal or pelvic lymph nodes. Reproductive: Prostate is unremarkable. Other: Previously seen fluid collection anterior to the rectum is noted and is smaller in size following repositioning the drainage catheter. No significant residual fluid collection is noted. No new abscess is seen. Postsurgical drain is also noted and stable. Musculoskeletal: No acute or significant osseous findings. IMPRESSION: Changes consistent with prior appendectomy. Persistent inflammatory changes are seen adjacent to the cecum. No abscess or extravasation is noted. Repositioning of the drainage catheter right hemipelvis with decompression of previously seen residual air-fluid collection. No new focal abnormality is noted. Electronically Signed   By: Alcide Clever M.D.   On: 05/20/2023 23:47     PERTINENT LAB RESULTS: CBC: Recent Labs    05/20/23 0519 05/21/23 0434  WBC 8.8 8.3  HGB 7.4* 8.4*  HCT 26.1* 28.4*  PLT 568* 515*    CMET CMP     Component Value Date/Time   NA 139 05/20/2023 0519   NA 142 10/25/2022 1027   K 4.3 05/20/2023 0519   CL 95 (L) 05/20/2023 0519   CO2 37 (H) 05/20/2023 0519   GLUCOSE 156 (H) 05/20/2023 0519   BUN 13 05/20/2023 0519   BUN 14 10/25/2022 1027   CREATININE 0.81 05/20/2023 0519   CREATININE 1.20 07/30/2016 1431   CALCIUM 8.6 (L) 05/20/2023 0519   PROT 6.1 (L) 05/17/2023 0343   PROT 6.7 10/25/2022 1027   ALBUMIN 2.6 (L) 05/20/2023 0519   ALBUMIN 4.4 10/25/2022 1027   AST 69 (H) 05/17/2023 0343   ALT 74 (H) 05/17/2023 0343   ALKPHOS 151 (H) 05/17/2023 0343   BILITOT 0.4 05/17/2023 0343   BILITOT 0.3 10/25/2022 1027   GFR 72.98 08/09/2020 0900   EGFR 85 10/25/2022 1027   GFRNONAA >60 05/20/2023 0519   GFRNONAA 64 07/30/2016 1431    GFR Estimated Creatinine Clearance: 115.3 mL/min (by C-G formula based on SCr of 0.81 mg/dL). No results for input(s): "LIPASE", "AMYLASE" in the last 72 hours. No results for input(s): "CKTOTAL", "CKMB", "CKMBINDEX", "TROPONINI" in the last 72 hours. Invalid input(s): "POCBNP" No results for input(s): "DDIMER" in the last 72 hours. No results for input(s): "HGBA1C" in the last 72 hours. No results for input(s): "CHOL", "HDL", "LDLCALC", "TRIG", "CHOLHDL", "LDLDIRECT" in the last 72 hours. No results for input(s): "TSH", "T4TOTAL", "T3FREE", "THYROIDAB" in the last 72 hours.  Invalid input(s): "FREET3" No results for input(s): "VITAMINB12", "FOLATE", "FERRITIN", "TIBC", "IRON", "RETICCTPCT" in the last 72 hours. Coags: No results for input(s): "INR" in the last 72 hours.  Invalid input(s): "PT" Microbiology: No results found for this or any previous visit (from the past 240 hour(s)).  FURTHER DISCHARGE INSTRUCTIONS:  Get Medicines reviewed and adjusted: Please take all your medications with you for your next visit with your Primary MD  Laboratory/radiological data: Please request your Primary  MD to go over all hospital tests  and procedure/radiological results at the follow up, please ask your Primary MD to get all Hospital records sent to his/her office.  In some cases, they will be blood work, cultures and biopsy results pending at the time of your discharge. Please request that your primary care M.D. goes through all the records of your hospital data and follows up on these results.  Also Note the following: If you experience worsening of your admission symptoms, develop shortness of breath, life threatening emergency, suicidal or homicidal thoughts you must seek medical attention immediately by calling 911 or calling your MD immediately  if symptoms less severe.  You must read complete instructions/literature along with all the possible adverse reactions/side effects for all the Medicines you take and that have been prescribed to you. Take any new Medicines after you have completely understood and accpet all the possible adverse reactions/side effects.   Do not drive when taking Pain medications or sleeping medications (Benzodaizepines)  Do not take more than prescribed Pain, Sleep and Anxiety Medications. It is not advisable to combine anxiety,sleep and pain medications without talking with your primary care practitioner  Special Instructions: If you have smoked or chewed Tobacco  in the last 2 yrs please stop smoking, stop any regular Alcohol  and or any Recreational drug use.  Wear Seat belts while driving.  Please note: You were cared for by a hospitalist during your hospital stay. Once you are discharged, your primary care physician will handle any further medical issues. Please note that NO REFILLS for any discharge medications will be authorized once you are discharged, as it is imperative that you return to your primary care physician (or establish a relationship with a primary care physician if you do not have one) for your post hospital discharge needs so that they can reassess your need for medications  and monitor your lab values.  Total Time spent coordinating discharge including counseling, education and face to face time equals greater than 30 minutes.  Signed: Donya Hitch 05/21/2023 10:20 AM

## 2023-05-21 NOTE — TOC Progression Note (Signed)
Transition of Care [facility]) - Progression Note    Patient Details  Name: Jared [name] MRN: [id number] Date of Birth: [birth year]  [facility] CM/SW Contact  Mearl Latin, LCSW Phone Number: 05/21/2023, 10:32 AM  Clinical Narrative:    CSW confirmed with patient's insurance that patient has until tomorrow to admit to SNF. CSW spoke with patient's sister, Adela Lank, to update her. She requested [facility] for transport.    Expected Discharge Plan: Skilled Nursing Facility Barriers to Discharge: Barriers Resolved  Expected Discharge Plan and Services In-house Referral: Clinical Social Work Discharge Planning Services: CM Consult Post Acute Care Choice: Skilled Nursing Facility Living arrangements for the past 2 months: Single Family Home Expected Discharge Date: 05/21/23               DME Arranged: N/A         HH Arranged: NA           Social Determinants of Health (SDOH) Interventions SDOH Screenings   Food Insecurity: No Food Insecurity (04/30/2023)  Housing: Low Risk  (04/30/2023)  Transportation Needs: No Transportation Needs (04/30/2023)  Utilities: Not At Risk (04/30/2023)  Alcohol Screen: Low Risk  (02/06/2023)  Depression (PHQ2-9): Low Risk  (02/04/2023)  Recent Concern: Depression (PHQ2-9) - High Risk (01/09/2023)  Financial Resource Strain: Medium Risk (02/06/2023)  Physical Activity: Insufficiently Active (02/06/2023)  Social Connections: Moderately Integrated (02/06/2023)  Stress: No Stress Concern Present (02/06/2023)  Tobacco Use: High Risk (05/05/2023)  Health Literacy: Adequate Health Literacy (02/06/2023)    Readmission Risk Interventions    12/27/2022    2:20 PM 12/14/2021    1:38 PM 02/01/2021    3:22 PM  Readmission Risk Prevention Plan  Post Dischage Appt Complete Complete   Medication Screening Complete Complete   Transportation Screening Complete Complete Complete  PCP or Specialist Appt within 5-7 Days   Complete  Home Care Screening    Complete  Medication Review (RN CM)   Complete

## 2023-05-21 NOTE — Progress Notes (Signed)
I have attempted to call report to [place] at [phone].  I was transferred 2x to speak with the nurse taking 212B and there was no answer.  I called back a 3rd time and there is no answer at the location.  Will continue to try and give report.

## 2023-05-21 NOTE — Progress Notes (Signed)
Cardiology will sign off, we will follow up with her in about one month. Let us know if you have questions.

## 2023-05-21 NOTE — Plan of Care (Signed)
  Problem: Education: Goal: Knowledge of General Education information will improve Description: Including pain rating scale, medication(s)/side effects and non-pharmacologic comfort measures Outcome: Progressing   Problem: Clinical Measurements: Goal: Ability to maintain clinical measurements within normal limits will improve Outcome: Progressing Goal: Will remain free from infection Outcome: Progressing Goal: Diagnostic test results will improve Outcome: Progressing Goal: Respiratory complications will improve Outcome: Progressing Goal: Cardiovascular complication will be avoided Outcome: Progressing   Problem: Activity: Goal: Risk for activity intolerance will decrease Outcome: Progressing   Problem: Coping: Goal: Level of anxiety will decrease Outcome: Progressing   Problem: Pain Management: Goal: General experience of comfort will improve Outcome: Progressing

## 2023-05-21 NOTE — Progress Notes (Signed)
Mobility Specialist Progress Note;   05/21/23 0901  Mobility  Activity Transferred from bed to chair  Level of Assistance Contact guard assist, steadying assist  Assistive Device Other (Comment) (HHA)  Distance Ambulated (ft) 3 ft  Activity Response Tolerated well  Mobility Referral Yes  Mobility visit 1 Mobility  Mobility Specialist Start Time (ACUTE ONLY) 0901  Mobility Specialist Stop Time (ACUTE ONLY) 0910  Mobility Specialist Time Calculation (min) (ACUTE ONLY) 9 min   Pt agreeable to mobility with encouragement. Required MinG assistance during transfer from bed to chair. VSS throughout on 3LO2. No c/o during session. Pt left in chair with all needs met, alarm on.   Caesar Bookman Mobility Specialist Please contact via SecureChat or Rehab Office [phone]

## 2023-05-30 DIAGNOSIS — I110 Hypertensive heart disease with heart failure: Secondary | ICD-10-CM | POA: Diagnosis not present

## 2023-05-30 DIAGNOSIS — Z9049 Acquired absence of other specified parts of digestive tract: Secondary | ICD-10-CM | POA: Diagnosis not present

## 2023-05-30 DIAGNOSIS — D75839 Thrombocytosis, unspecified: Secondary | ICD-10-CM | POA: Diagnosis not present

## 2023-05-30 DIAGNOSIS — Z8719 Personal history of other diseases of the digestive system: Secondary | ICD-10-CM | POA: Diagnosis not present

## 2023-05-30 DIAGNOSIS — I5022 Chronic systolic (congestive) heart failure: Secondary | ICD-10-CM | POA: Diagnosis not present

## 2023-05-30 DIAGNOSIS — D62 Acute posthemorrhagic anemia: Secondary | ICD-10-CM | POA: Diagnosis not present

## 2023-05-30 DIAGNOSIS — J9611 Chronic respiratory failure with hypoxia: Secondary | ICD-10-CM | POA: Diagnosis not present

## 2023-05-30 DIAGNOSIS — J449 Chronic obstructive pulmonary disease, unspecified: Secondary | ICD-10-CM | POA: Diagnosis not present

## 2023-05-30 DIAGNOSIS — I272 Pulmonary hypertension, unspecified: Secondary | ICD-10-CM | POA: Diagnosis not present

## 2023-05-31 ENCOUNTER — Other Ambulatory Visit: Payer: Self-pay

## 2023-05-31 ENCOUNTER — Other Ambulatory Visit: Payer: Self-pay | Admitting: Internal Medicine

## 2023-05-31 DIAGNOSIS — I5022 Chronic systolic (congestive) heart failure: Secondary | ICD-10-CM | POA: Diagnosis not present

## 2023-05-31 DIAGNOSIS — J9610 Chronic respiratory failure, unspecified whether with hypoxia or hypercapnia: Secondary | ICD-10-CM | POA: Diagnosis not present

## 2023-05-31 DIAGNOSIS — I739 Peripheral vascular disease, unspecified: Secondary | ICD-10-CM | POA: Diagnosis not present

## 2023-05-31 DIAGNOSIS — I2089 Other forms of angina pectoris: Secondary | ICD-10-CM | POA: Diagnosis not present

## 2023-05-31 DIAGNOSIS — I272 Pulmonary hypertension, unspecified: Secondary | ICD-10-CM | POA: Diagnosis not present

## 2023-05-31 DIAGNOSIS — I4892 Unspecified atrial flutter: Secondary | ICD-10-CM | POA: Diagnosis not present

## 2023-05-31 DIAGNOSIS — J449 Chronic obstructive pulmonary disease, unspecified: Secondary | ICD-10-CM | POA: Diagnosis not present

## 2023-05-31 DIAGNOSIS — Z7901 Long term (current) use of anticoagulants: Secondary | ICD-10-CM | POA: Diagnosis not present

## 2023-05-31 DIAGNOSIS — I4729 Other ventricular tachycardia: Secondary | ICD-10-CM | POA: Diagnosis not present

## 2023-05-31 DIAGNOSIS — Z8719 Personal history of other diseases of the digestive system: Secondary | ICD-10-CM | POA: Diagnosis not present

## 2023-05-31 DIAGNOSIS — D649 Anemia, unspecified: Secondary | ICD-10-CM | POA: Diagnosis not present

## 2023-05-31 DIAGNOSIS — K5520 Angiodysplasia of colon without hemorrhage: Secondary | ICD-10-CM | POA: Diagnosis not present

## 2023-05-31 MED ORDER — BREZTRI AEROSPHERE 160-9-4.8 MCG/ACT IN AERO
INHALATION_SPRAY | RESPIRATORY_TRACT | 1 refills | Status: DC
  Filled 2023-05-31: qty 10.7, 30d supply, fill #0
  Filled 2023-07-12: qty 10.7, 30d supply, fill #1

## 2023-05-31 MED ORDER — OXYCODONE HCL 5 MG PO TABS
5.0000 mg | ORAL_TABLET | Freq: Four times a day (QID) | ORAL | 0 refills | Status: DC | PRN
  Filled 2023-05-31: qty 20, 5d supply, fill #0

## 2023-06-02 DIAGNOSIS — K651 Peritoneal abscess: Secondary | ICD-10-CM | POA: Diagnosis not present

## 2023-06-02 DIAGNOSIS — I5042 Chronic combined systolic (congestive) and diastolic (congestive) heart failure: Secondary | ICD-10-CM | POA: Diagnosis not present

## 2023-06-02 DIAGNOSIS — A419 Sepsis, unspecified organism: Secondary | ICD-10-CM | POA: Diagnosis not present

## 2023-06-02 DIAGNOSIS — J449 Chronic obstructive pulmonary disease, unspecified: Secondary | ICD-10-CM | POA: Diagnosis not present

## 2023-06-02 DIAGNOSIS — I480 Paroxysmal atrial fibrillation: Secondary | ICD-10-CM | POA: Diagnosis not present

## 2023-06-02 DIAGNOSIS — I4892 Unspecified atrial flutter: Secondary | ICD-10-CM | POA: Diagnosis not present

## 2023-06-02 DIAGNOSIS — I110 Hypertensive heart disease with heart failure: Secondary | ICD-10-CM | POA: Diagnosis not present

## 2023-06-02 DIAGNOSIS — J9621 Acute and chronic respiratory failure with hypoxia: Secondary | ICD-10-CM | POA: Diagnosis not present

## 2023-06-02 DIAGNOSIS — T8141XD Infection following a procedure, superficial incisional surgical site, subsequent encounter: Secondary | ICD-10-CM | POA: Diagnosis not present

## 2023-06-06 ENCOUNTER — Other Ambulatory Visit: Payer: Self-pay

## 2023-06-06 ENCOUNTER — Other Ambulatory Visit: Payer: Self-pay | Admitting: Family Medicine

## 2023-06-06 ENCOUNTER — Other Ambulatory Visit: Payer: Self-pay | Admitting: Internal Medicine

## 2023-06-06 DIAGNOSIS — I4892 Unspecified atrial flutter: Secondary | ICD-10-CM

## 2023-06-06 MED ORDER — EZETIMIBE 10 MG PO TABS
10.0000 mg | ORAL_TABLET | Freq: Every day | ORAL | 2 refills | Status: DC
  Filled 2023-06-06: qty 90, 90d supply, fill #0
  Filled 2023-08-27: qty 90, 90d supply, fill #1
  Filled 2023-12-30: qty 90, 90d supply, fill #2

## 2023-06-06 MED ORDER — DULOXETINE HCL 60 MG PO CPEP
60.0000 mg | ORAL_CAPSULE | Freq: Every day | ORAL | 3 refills | Status: DC
  Filled 2023-06-06: qty 30, 30d supply, fill #0
  Filled 2023-07-12: qty 30, 30d supply, fill #1
  Filled 2023-08-15: qty 30, 30d supply, fill #2

## 2023-06-06 MED ORDER — RIVAROXABAN 20 MG PO TABS
20.0000 mg | ORAL_TABLET | Freq: Every day | ORAL | 0 refills | Status: DC
  Filled 2023-06-06: qty 90, 90d supply, fill #0

## 2023-06-07 ENCOUNTER — Other Ambulatory Visit: Payer: Self-pay

## 2023-06-10 ENCOUNTER — Encounter: Payer: Self-pay | Admitting: Family Medicine

## 2023-06-10 ENCOUNTER — Ambulatory Visit: Payer: Medicare HMO | Attending: Family Medicine | Admitting: Family Medicine

## 2023-06-10 ENCOUNTER — Other Ambulatory Visit: Payer: Self-pay

## 2023-06-10 ENCOUNTER — Telehealth: Payer: Self-pay | Admitting: *Deleted

## 2023-06-10 VITALS — BP 111/56 | HR 84 | Wt 202.4 lb

## 2023-06-10 DIAGNOSIS — R5383 Other fatigue: Secondary | ICD-10-CM | POA: Diagnosis not present

## 2023-06-10 DIAGNOSIS — Z9049 Acquired absence of other specified parts of digestive tract: Secondary | ICD-10-CM

## 2023-06-10 DIAGNOSIS — E1169 Type 2 diabetes mellitus with other specified complication: Secondary | ICD-10-CM | POA: Diagnosis not present

## 2023-06-10 DIAGNOSIS — D500 Iron deficiency anemia secondary to blood loss (chronic): Secondary | ICD-10-CM | POA: Diagnosis not present

## 2023-06-10 DIAGNOSIS — I110 Hypertensive heart disease with heart failure: Secondary | ICD-10-CM | POA: Diagnosis not present

## 2023-06-10 DIAGNOSIS — E559 Vitamin D deficiency, unspecified: Secondary | ICD-10-CM

## 2023-06-10 DIAGNOSIS — Z139 Encounter for screening, unspecified: Secondary | ICD-10-CM

## 2023-06-10 DIAGNOSIS — I5042 Chronic combined systolic (congestive) and diastolic (congestive) heart failure: Secondary | ICD-10-CM

## 2023-06-10 DIAGNOSIS — F17210 Nicotine dependence, cigarettes, uncomplicated: Secondary | ICD-10-CM

## 2023-06-10 LAB — POCT GLYCOSYLATED HEMOGLOBIN (HGB A1C): HbA1c, POC (controlled diabetic range): 5.4 % (ref 0.0–7.0)

## 2023-06-10 MED ORDER — ERGOCALCIFEROL 1.25 MG (50000 UT) PO CAPS
50000.0000 [IU] | ORAL_CAPSULE | ORAL | 1 refills | Status: DC
  Filled 2023-06-10: qty 12, 84d supply, fill #0
  Filled 2023-08-27: qty 12, 84d supply, fill #1

## 2023-06-10 MED ORDER — ISOSORBIDE MONONITRATE ER 30 MG PO TB24
30.0000 mg | ORAL_TABLET | Freq: Every day | ORAL | 1 refills | Status: DC
  Filled 2023-06-14: qty 90, 90d supply, fill #0

## 2023-06-10 MED ORDER — GABAPENTIN 300 MG PO CAPS
300.0000 mg | ORAL_CAPSULE | Freq: Two times a day (BID) | ORAL | 1 refills | Status: DC
  Filled 2023-06-14: qty 90, 45d supply, fill #0
  Filled 2023-07-12 – 2023-08-01 (×2): qty 90, 45d supply, fill #1

## 2023-06-10 NOTE — Progress Notes (Signed)
Complex Care Management Note  Care Guide Note 06/10/2023 Name: Kobie Brotman MRN: [id number] DOB: 1955/11/20  Dewyane Zug is a 67 y.o. year old male who sees [name], MD for primary care. I reached out to [facility] by phone today to offer complex care management services.  Mr. Mccormack was given information about Complex Care Management services today including:   The Complex Care Management services include support from the care team which includes your Nurse Coordinator, Clinical Social Worker, or Pharmacist.  The Complex Care Management team is here to help remove barriers to the health concerns and goals most important to you. Complex Care Management services are voluntary, and the patient may decline or stop services at any time by request to their care team member.   Complex Care Management Consent Status: Patient agreed to services and verbal consent obtained.   Follow up plan:  Telephone appointment with complex care management team member scheduled for:  06/13/2023  Encounter Outcome:  Patient Scheduled  Burman Nieves, [facility] Care Coordination Care Guide Direct Dial: [phone]

## 2023-06-10 NOTE — Patient Instructions (Signed)
Fatigue If you have fatigue, you feel tired all the time and have a lack of energy or a lack of motivation. Fatigue may make it difficult to start or complete tasks because of exhaustion. Occasional or mild fatigue is often a normal response to activity or life. However, long-term (chronic) or extreme fatigue may be a symptom of a medical condition such as: Depression. Not having enough red blood cells or hemoglobin in the blood (anemia). A problem with a small gland located in the lower front part of the neck (thyroid disorder). Rheumatologic conditions. These are problems related to the body's defense system (immune system). Infections, especially certain viral infections. Fatigue can also lead to negative health outcomes over time. Follow these instructions at home: Medicines Take over-the-counter and prescription medicines only as told by your health care provider. Take a multivitamin if told by your health care provider. Do not use herbal or dietary supplements unless they are approved by your health care provider. Eating and drinking  Avoid heavy meals in the evening. Eat a well-balanced diet, which includes lean proteins, whole grains, plenty of fruits and vegetables, and low-fat dairy products. Avoid eating or drinking too many products with caffeine in them. Avoid alcohol. Drink enough fluid to keep your urine pale yellow. Activity  Exercise regularly, as told by your health care provider. Use or practice techniques to help you relax, such as yoga, tai chi, meditation, or massage therapy. Lifestyle Change situations that cause you stress. Try to keep your work and personal schedules in balance. Do not use recreational or illegal drugs. General instructions Monitor your fatigue for any changes. Go to bed and get up at the same time every day. Avoid fatigue by pacing yourself during the day and getting enough sleep at night. Maintain a healthy weight. Contact a health care  provider if: Your fatigue does not get better. You have a fever. You suddenly lose or gain weight. You have headaches. You have trouble falling asleep or sleeping through the night. You feel angry, guilty, anxious, or sad. You have swelling in your legs or another part of your body. Get help right away if: You feel confused, feel like you might faint, or faint. Your vision is blurry or you have a severe headache. You have severe pain in your abdomen, your back, or the area between your waist and hips (pelvis). You have chest pain, shortness of breath, or an irregular or fast heartbeat. You are unable to urinate, or you urinate less than normal. You have abnormal bleeding from the rectum, nose, lungs, nipples, or, if you are male, the vagina. You vomit blood. You have thoughts about hurting yourself or others. These symptoms may be an emergency. Get help right away. Call 911. Do not wait to see if the symptoms will go away. Do not drive yourself to the hospital. Get help right away if you feel like you may hurt yourself or others, or have thoughts about taking your own life. Go to your nearest emergency room or: Call 911. Call the National Suicide Prevention Lifeline at 1-800-273-8255 or 988. This is open 24 hours a day. Text the Crisis Text Line at 741741. Summary If you have fatigue, you feel tired all the time and have a lack of energy or a lack of motivation. Fatigue may make it difficult to start or complete tasks because of exhaustion. Long-term (chronic) or extreme fatigue may be a symptom of a medical condition. Exercise regularly, as told by your health care provider.   Change situations that cause you stress. Try to keep your work and personal schedules in balance. This information is not intended to replace advice given to you by your health care provider. Make sure you discuss any questions you have with your health care provider. Document Revised: 03/20/2021 Document  Reviewed: 03/20/2021 Elsevier Patient Education  2024 [employer]  

## 2023-06-10 NOTE — Progress Notes (Signed)
Subjective:  Patient ID: Jared [name], male    DOB: 1956/03/29  Age: 67 y.o. MRN: [id number]  CC: Medication Refill and Hospitalization Follow-up   HPI Jared [name] is a 67 y.o. year old male with a history of hypertension, COPD, tobacco abuse, NICM , CHF (EF 55-60% from echo 04/2023), atrial flutter, chronic respiratory failure with hypoxia (currently on 2-3 L of oxygen), multiple hospitalizations for GI bleed with secondary anemia due to AV malformations, peptic ulcer disease, previous history of CVA, Type 2 DM, PAD status post left femoral-popliteal angioplasty and stenting (in 11/2022), status post laparoscopy appendectomy with development of deep pelvic abscess requiring drain placement in 04/2023. During his hospital course, he developed sepsis with acute renal failure.  He was stabilized and subsequently discharged to inpatient rehab.  Interval History: Discussed the use of AI scribe software for clinical note transcription with the patient, who gave verbal consent to proceed.   He reports occasional mild pain at the surgical site but denies fever and chills. He has been eating well and has had no issues with bowel movements. He has not yet followed up with the surgeon but has an appointment scheduled.  The patient also reports significant fatigue, stating he "ain't got no energy" and struggles to perform daily tasks such as cooking. This is a change from his baseline prior to the hospital admission. He has been receiving iron transfusions for anemia but missed some sessions due to the hospital stay. Hemoglobin at discharge was 8.4.  The patient also has a history of low vitamin D levels and is currently taking weekly vitamin D capsules. He has not smoked since his hospital admission and uses two to three liters of oxygen.  He endorses adherence with his COPD inhalers.  He lives alone and has expressed interest in getting a home health aide.     His diabetes is diet controlled.   Past  Medical History:  Diagnosis Date   AVM (arteriovenous malformation)    CAD (coronary artery disease)    a. LHC 5/12:  LAD 20, pLCx 20, pRCA 40, dRCA 40, EF 35%, diff HK  //  b. Myoview 4/16: Overall Impression:  High risk stress nuclear study There is no evidence of ischemia.  There is severe LV dysfunction. LV Ejection Fraction: 30%.  LV Wall Motion:  There is global LV hypokinesis.     CAP (community acquired pneumonia) 09/2013   Chronic combined systolic and diastolic CHF (congestive heart failure) (HCC)    a. Echo 4/16:Mild LVH, EF 40-45%, diffuse HK //  b. Echo 8/17: EF 35-40%, diffuse HK, diastolic dysfunction, aortic sclerosis, trivial MR, moderate LAE, normal RVSF, moderate RAE, mild TR, PASP 42 mmHg // c. Echo 4/18: Mild concentric LVH, EF 30-35, normal wall motion, grade 1 diastolic dysfunction, PASP 49   Chronic respiratory failure (HCC)    Cluster headache    "hx; haven't had one in awhile" (01/09/2016)   COPD (chronic obstructive pulmonary disease) (HCC)    [name] 01/09/2016   DM (diabetes mellitus) (HCC)    History of CVA (cerebrovascular accident)    Hypertension    IDA (iron deficiency anemia)    Moderate tobacco use disorder    NICM (nonischemic cardiomyopathy) (HCC)    Nicotine addiction    Tobacco abuse    Type 2 diabetes mellitus (HCC) 05/14/2016    Past Surgical History:  Procedure Laterality Date   ABDOMINAL AORTOGRAM W/LOWER EXTREMITY N/A 12/07/2022   Procedure: ABDOMINAL AORTOGRAM W/LOWER EXTREMITY;  Surgeon: Lenell Antu,  Rande Brunt, MD;  [facility]: [facility];  Service: Cardiovascular;  Laterality: N/A;   BIOPSY  11/12/2020   Procedure: BIOPSY;  Surgeon: Lemar Lofty., MD;  Location: [facility] ENDOSCOPY;  Service: Gastroenterology;;   CARDIAC CATHETERIZATION  10/2010   LM normal, LAD with 20% irregularities, LCX with 20%, RCA with 40% prox and 40% distal - EF of 35%   CATARACT EXTRACTION, BILATERAL     COLONOSCOPY W/ BIOPSIES AND POLYPECTOMY     COLONOSCOPY  WITH PROPOFOL N/A 09/06/2018   Procedure: COLONOSCOPY WITH PROPOFOL;  Surgeon: Tressia Danas, MD;  Location: [facility] ENDOSCOPY;  Service: Gastroenterology;  Laterality: N/A;   ENTEROSCOPY N/A 09/28/2018   Procedure: ENTEROSCOPY;  Surgeon: Jeani Hawking, MD;  Location: [facility] ENDOSCOPY;  Service: Endoscopy;  Laterality: N/A;   ENTEROSCOPY N/A 10/28/2018   Procedure: ENTEROSCOPY;  Surgeon: Tressia Danas, MD;  Location: [facility] ENDOSCOPY;  Service: Gastroenterology;  Laterality: N/A;   ENTEROSCOPY N/A 10/09/2020   Procedure: ENTEROSCOPY;  Surgeon: Hilarie Fredrickson, MD;  Location: [facility] ENDOSCOPY;  Service: Endoscopy;  Laterality: N/A;   ENTEROSCOPY N/A 03/22/2022   Procedure: ENTEROSCOPY;  Surgeon: Benancio Deeds, MD;  Location: [facility] ENDOSCOPY;  Service: Gastroenterology;  Laterality: N/A;   ENTEROSCOPY N/A 05/20/2023   Procedure: ENTEROSCOPY;  Surgeon: Sherrilyn Rist, MD;  Location: [facility] ENDOSCOPY;  Service: Gastroenterology;  Laterality: N/A;   ESOPHAGOGASTRODUODENOSCOPY N/A 11/12/2020   Procedure: ESOPHAGOGASTRODUODENOSCOPY (EGD);  Surgeon: Lemar Lofty., MD;  Location: [facility] ENDOSCOPY;  Service: Gastroenterology;  Laterality: N/A;   ESOPHAGOGASTRODUODENOSCOPY (EGD) WITH PROPOFOL N/A 09/05/2018   Procedure: ESOPHAGOGASTRODUODENOSCOPY (EGD) WITH PROPOFOL;  Surgeon: Benancio Deeds, MD;  Location: [facility] ENDOSCOPY;  Service: Gastroenterology;  Laterality: N/A;   ESOPHAGOGASTRODUODENOSCOPY (EGD) WITH PROPOFOL N/A 11/19/2020   Procedure: ESOPHAGOGASTRODUODENOSCOPY (EGD) WITH PROPOFOL;  Surgeon: Beverley Fiedler, MD;  Location: [facility] ENDOSCOPY;  Service: Gastroenterology;  Laterality: N/A;   ESOPHAGOGASTRODUODENOSCOPY (EGD) WITH PROPOFOL N/A 12/27/2022   Procedure: ESOPHAGOGASTRODUODENOSCOPY (EGD) WITH PROPOFOL;  Surgeon: Napoleon Form, MD;  Location: [facility] ENDOSCOPY;  Service: Gastroenterology;  Laterality: N/A;   EXCISION MASS HEAD     GIVENS CAPSULE STUDY N/A 09/06/2018   Procedure: GIVENS CAPSULE STUDY;   Surgeon: Tressia Danas, MD;  Location: [facility] ENDOSCOPY;  Service: Gastroenterology;  Laterality: N/A;   GIVENS CAPSULE STUDY N/A 09/26/2018   Procedure: GIVENS CAPSULE STUDY;  Surgeon: Beverley Fiedler, MD;  Location: [facility] ENDOSCOPY;  Service: Gastroenterology;  Laterality: N/A;   GIVENS CAPSULE STUDY N/A 06/14/2019   Procedure: GIVENS CAPSULE STUDY;  Surgeon: Napoleon Form, MD;  Location: [facility] ENDOSCOPY;  Service: Endoscopy;  Laterality: N/A;   HEMOSTASIS CLIP PLACEMENT  11/12/2020   Procedure: HEMOSTASIS CLIP PLACEMENT;  Surgeon: Lemar Lofty., MD;  Location: [facility] ENDOSCOPY;  Service: Gastroenterology;;   HEMOSTASIS CONTROL  11/12/2020   Procedure: HEMOSTASIS CONTROL;  Surgeon: Lemar Lofty., MD;  Location: [facility] ENDOSCOPY;  Service: Gastroenterology;;   HOT HEMOSTASIS N/A 10/28/2018   Procedure: HOT HEMOSTASIS (ARGON PLASMA COAGULATION/BICAP);  Surgeon: Tressia Danas, MD;  Location: [facility] ENDOSCOPY;  Service: Gastroenterology;  Laterality: N/A;   HOT HEMOSTASIS N/A 11/12/2020   Procedure: HOT HEMOSTASIS (ARGON PLASMA COAGULATION/BICAP);  Surgeon: Lemar Lofty., MD;  Location: [facility] ENDOSCOPY;  Service: Gastroenterology;  Laterality: N/A;   HOT HEMOSTASIS N/A 03/22/2022   Procedure: HOT HEMOSTASIS (ARGON PLASMA COAGULATION/BICAP);  Surgeon: Benancio Deeds, MD;  Location: [facility] ENDOSCOPY;  Service: Gastroenterology;  Laterality: N/A;   HOT HEMOSTASIS N/A 05/20/2023   Procedure: HOT HEMOSTASIS (ARGON PLASMA COAGULATION/BICAP);  Surgeon: Sherrilyn Rist, MD;  Location: [facility] ENDOSCOPY;  Service: Gastroenterology;  Laterality: N/A;   INCISION AND DRAINAGE PERIRECTAL ABSCESS N/A 06/05/2017   Procedure: IRRIGATION AND DEBRIDEMENT PERIRECTAL ABSCESS;  Surgeon: Andria Meuse, MD;  Location: [facility] OR;  Service: General;  Laterality: N/A;   IR CATHETER TUBE CHANGE  05/14/2023   LAPAROSCOPIC APPENDECTOMY N/A 05/01/2023   Procedure: APPENDECTOMY LAPAROSCOPIC;  Surgeon: Emelia Loron, MD;  Location: [facility] OR;  Service: General;  Laterality: N/A;   PERIPHERAL VASCULAR INTERVENTION  12/07/2022   Procedure: PERIPHERAL VASCULAR INTERVENTION;  Surgeon: Leonie Douglas, MD;  [facility]: [facility];  Service: Cardiovascular;;   SUBMUCOSAL TATTOO INJECTION  11/12/2020   Procedure: SUBMUCOSAL TATTOO INJECTION;  Surgeon: Lemar Lofty., MD;  Location: [facility] ENDOSCOPY;  Service: Gastroenterology;;   SUBMUCOSAL TATTOO INJECTION  05/20/2023   Procedure: SUBMUCOSAL TATTOO INJECTION;  Surgeon: Sherrilyn Rist, MD;  Location: [facility] ENDOSCOPY;  Service: Gastroenterology;;   VIDEO BRONCHOSCOPY Bilateral 05/08/2016   Procedure: VIDEO BRONCHOSCOPY WITH FLUORO;  Surgeon: Oretha Milch, MD;  Location: [facility] ENDOSCOPY;  Service: Cardiopulmonary;  Laterality: Bilateral;    Family History  Problem Relation Age of Onset   Heart disease Mother    Diabetes Mother    Colon cancer Mother    Liver cancer Mother    Cancer Father        type unknown   Diabetes Sister        x 2   Diabetes Brother     Social History   Socioeconomic History   Marital status: Single    Spouse name: Not on file   Number of children: 1   Years of education: Not on file   Highest education level: 12th grade  Occupational History   Occupation: retired  Tobacco Use   Smoking status: Every Day    Current packs/day: 0.50    Average packs/day: 0.5 packs/day for 47.0 years (23.5 ttl pk-yrs)    Types: Cigarettes   Smokeless tobacco: Never   Tobacco comments:    4/5 cigs  Vaping Use   Vaping status: Never Used  Substance and Sexual Activity   Alcohol use: Not Currently    Alcohol/week: 0.0 standard drinks of alcohol    Comment: last drink was before xmas   Drug use: No    Types: Cocaine, Marijuana    Comment: "nothing in 20 years"   Sexual activity: Not Currently  Other Topics Concern   Not on file  Social History Narrative   unemployed   Social Drivers of Corporate investment banker  Strain: Low Risk  (06/10/2023)   Overall Financial Resource Strain (CARDIA)    Difficulty of Paying Living Expenses: Not hard at all  Food Insecurity: Food Insecurity Present (06/10/2023)   Hunger Vital Sign    Worried About Running Out of Food in the Last Year: Sometimes true    Ran Out of Food in the Last Year: Never true  Transportation Needs: No Transportation Needs (06/10/2023)   PRAPARE - Administrator, Civil Service (Medical): No    Lack of Transportation (Non-Medical): No  Physical Activity: Insufficiently Active (06/10/2023)   Exercise Vital Sign    Days of Exercise per Week: 3 days    Minutes of Exercise per Session: 20 min  Stress: No Stress Concern Present (06/10/2023)   [employer] of Occupational Health - Occupational Stress Questionnaire    Feeling of Stress : Not at all  Social Connections:  Moderately Isolated (06/10/2023)   Social Connection and Isolation Panel [NHANES]    Frequency of Communication with Friends and Family: More than three times a week    Frequency of Social Gatherings with Friends and Family: Never    Attends Religious Services: Never    Database administrator or Organizations: Yes    Attends Banker Meetings: Never    Marital Status: Divorced    Allergies  Allergen Reactions   Lisinopril Cough    Outpatient Medications Prior to Visit  Medication Sig Dispense Refill   Accu-Chek Softclix Lancets lancets Use to check blood sugar once daily. 100 each 6   acetaminophen (TYLENOL) 500 MG tablet Take 2 tablets (1,000 mg total) by mouth every 8 (eight) hours as needed for moderate pain. 100 tablet 1   albuterol (PROVENTIL) (2.5 MG/3ML) 0.083% nebulizer solution Take 3 mLs (2.5 mg total) by nebulization every 2 (two) hours as needed for wheezing or shortness of breath.     albuterol (VENTOLIN HFA) 108 (90 Base) MCG/ACT inhaler INHALE 2 PUFFS INTO THE LUNGS EVERY 6 (SIX) HOURS AS NEEDED FOR WHEEZING OR SHORTNESS OF BREATH.      atorvastatin (LIPITOR) 80 MG tablet Take 1 tablet (80 mg total) by mouth daily. 90 tablet 1   Blood Glucose Monitoring Suppl (ACCU-CHEK GUIDE) w/Device KIT Use to check blood sugar once daily. 1 kit 0   Budeson-Glycopyrrol-Formoterol (BREZTRI AEROSPHERE) 160-9-4.8 MCG/ACT AERO Take 2 puffs first thing in morning and then another 2 puffs about 12 hours later. 10.7 g 1   DULoxetine (CYMBALTA) 60 MG capsule Take 1 capsule (60 mg total) by mouth daily. For chronic leg pains 30 capsule 3   ezetimibe (ZETIA) 10 MG tablet Take 1 tablet (10 mg total) by mouth daily. 90 tablet 2   feeding supplement (ENSURE ENLIVE / ENSURE PLUS) LIQD Take 237 mLs by mouth 2 (two) times daily between meals.     ferrous sulfate (FEROSUL) 325 (65 FE) MG tablet Take 1 tablet (325 mg total) by mouth 2 (two) times daily with a meal.     folic acid (FOLVITE) 1 MG tablet Take 1 tablet (1 mg total) by mouth daily. 90 tablet 1   furosemide (LASIX) 40 MG tablet Take 1 tablet (40 mg total) by mouth daily.     glucose blood (ACCU-CHEK GUIDE) test strip Use to check blood sugar once daily. 100 each 6   hydrALAZINE (APRESOLINE) 50 MG tablet Take 1 tablet (50 mg total) by mouth every 8 (eight) hours. 270 tablet 1   metoprolol succinate (TOPROL-XL) 50 MG 24 hr tablet Take 1.5 tablets (75 mg total) by mouth daily. Take with or immediately following a meal.     Multiple Vitamin (MULTIVITAMIN WITH MINERALS) TABS tablet Take 1 tablet by mouth daily.     oxyCODONE (OXY IR/ROXICODONE) 5 MG immediate release tablet Take 1-2 tablets (5-10 mg total) by mouth every 6 (six) hours as needed for moderate pain (pain score 4-6) or severe pain (pain score 7-10) (5 mg for 4-6/10 pain. 10 mg for >6/10 pain). 20 tablet 0   oxyCODONE (OXY IR/ROXICODONE) 5 MG immediate release tablet Take 1 tablet (5 mg total) by mouth every 6 (six) hours as needed for moderate pain score (4-10). 20 tablet 0   OXYGEN Inhale 2 L/min into the lungs continuous.     pantoprazole  (PROTONIX) 40 MG tablet Take 1 tablet (40 mg total) by mouth 2 (two) times daily before a meal.  polyethylene glycol (MIRALAX / GLYCOLAX) 17 g packet Take 17 g by mouth daily. 14 each 0   rivaroxaban (XARELTO) 20 MG TABS tablet Take 1 tablet (20 mg total) by mouth daily with supper. 90 tablet 0   ergocalciferol (DRISDOL) 1.25 MG (50000 UT) capsule Take 1 capsule (50,000 Units total) by mouth once a week. 12 capsule 0   gabapentin (NEURONTIN) 300 MG capsule Take 1 capsule (300 mg total) by mouth 2 (two) times daily. 90 capsule 1   isosorbide mononitrate (IMDUR) 30 MG 24 hr tablet Take 1 tablet (30 mg total) by mouth daily. 90 tablet 1   nitroGLYCERIN (NITROSTAT) 0.4 MG SL tablet Place 1 tablet (0.4 mg total) under the tongue every 5 (five) minutes as needed for chest pain.     No facility-administered medications prior to visit.     ROS Review of Systems  Constitutional:  Negative for activity change and appetite change.  HENT:  Negative for sinus pressure and sore throat.   Respiratory:  Negative for chest tightness, shortness of breath and wheezing.   Cardiovascular:  Negative for chest pain and palpitations.  Gastrointestinal:  Negative for abdominal distention, abdominal pain and constipation.  Genitourinary: Negative.   Musculoskeletal: Negative.   Psychiatric/Behavioral:  Negative for behavioral problems and dysphoric mood.     Objective:  BP (!) 111/56 (BP Location: Left Arm, Patient Position: Sitting, Cuff Size: Normal)   Pulse 84   Wt 202 lb 6.4 oz (91.8 kg)   SpO2 95%   BMI 25.30 kg/m      06/10/2023    9:23 AM 05/21/2023    8:46 AM 05/21/2023    8:00 AM  BP/Weight  Systolic BP 111 122 121  Diastolic BP 56 67 68  Wt. (Lbs) 202.4    BMI 25.3 kg/m2        Physical Exam Constitutional:      Appearance: He is well-developed.  HENT:     Nose:     Comments: On oxygen via nasal cannula Cardiovascular:     Rate and Rhythm: Normal rate.     Heart sounds:  Normal heart sounds. No murmur heard. Pulmonary:     Effort: Pulmonary effort is normal.     Breath sounds: Normal breath sounds. No wheezing or rales.  Chest:     Chest wall: No tenderness.  Abdominal:     General: Bowel sounds are normal. There is no distension.     Palpations: Abdomen is soft. There is no mass.     Tenderness: There is no abdominal tenderness.     Comments: Healed lower abdominal wall surgical scars  Musculoskeletal:        General: Normal range of motion.     Right lower leg: No edema.     Left lower leg: No edema.  Neurological:     Mental Status: He is alert and oriented to person, place, and time.  Psychiatric:        Mood and Affect: Mood normal.        Latest Ref Rng & Units 05/20/2023    5:19 AM 05/19/2023    2:04 AM 05/17/2023    3:43 AM  CMP  Glucose 70 - 99 mg/dL 098  119  147   BUN 8 - 23 mg/dL 13  15  16    Creatinine 0.61 - 1.24 mg/dL 8.29  5.62  1.30   Sodium 135 - 145 mmol/L 139  138  138   Potassium 3.5 - 5.1 mmol/L  4.3  5.0  4.3   Chloride 98 - 111 mmol/L 95  93  94   CO2 22 - 32 mmol/L 37  38  38   Calcium 8.9 - 10.3 mg/dL 8.6  8.6  8.7   Total Protein 6.5 - 8.1 g/dL   6.1   Total Bilirubin <1.2 mg/dL   0.4   Alkaline Phos 38 - 126 U/L   151   AST 15 - 41 U/L   69   ALT 0 - 44 U/L   74     Lipid Panel     Component Value Date/Time   CHOL 127 10/25/2022 1027   TRIG 80 10/25/2022 1027   HDL 49 10/25/2022 1027   CHOLHDL 2.9 01/29/2020 1020   CHOLHDL 2.8 01/11/2016 0446   VLDL 14 01/11/2016 0446   LDLCALC 62 10/25/2022 1027    CBC    Component Value Date/Time   WBC 8.3 05/21/2023 0434   RBC 3.14 (L) 05/21/2023 0434   HGB 8.4 (L) 05/21/2023 0434   HGB 10.6 (L) 04/22/2023 0919   HGB 9.8 (L) 01/09/2023 1029   HCT 28.4 (L) 05/21/2023 0434   HCT 33.9 (L) 01/09/2023 1029   PLT 515 (H) 05/21/2023 0434   PLT 242 04/22/2023 0919   PLT 312 01/09/2023 1029   MCV 90.4 05/21/2023 0434   MCV 85 01/09/2023 1029   MCH 26.8 05/21/2023  0434   MCHC 29.6 (L) 05/21/2023 0434   RDW 16.7 (H) 05/21/2023 0434   RDW 18.4 (H) 01/09/2023 1029   LYMPHSABS 1.6 05/08/2023 0448   LYMPHSABS 1.8 01/09/2023 1029   MONOABS 0.9 05/08/2023 0448   EOSABS 0.3 05/08/2023 0448   EOSABS 0.1 01/09/2023 1029   BASOSABS 0.1 05/08/2023 0448   BASOSABS 0.1 01/09/2023 1029    Lab Results  Component Value Date   HGBA1C 5.4 06/10/2023    Assessment & Plan:      Post-Appendectomy Recovery Patient reports occasional pain and fatigue since discharge from hospital and rehab. No signs of infection or complications from physical examination. -Order CBC to check for anemia and infection.  Vitamin D deficiency -Order Vitamin D level, continue Vitamin D supplementation.  Anemia -History of blood loss anemia and has been undergoing transfusions -Encourage patient to contact cancer center for iron transfusions.  Fatigue Patient reports difficulty with daily activities since hospital discharge.  -Will check CBC and also vitamin D as anemia could explain his fatigue Discussed the need for a home health aide with patient. -Complete form for home health aide request. -Contact [employer] to arrange for home health aide.   Type 2 diabetes mellitus -Diet controlled   Hypertensive heart disease EF 55 to 60% Euvolemic -Continue beta-blocker, isosorbide, hydralazine  Smoking Cessation Patient reports cessation of smoking since hospital admission. -Encourage continued smoking cessation.  Follow-up Patient has upcoming appointment with surgeon. -Ensure patient attends follow-up appointment with surgeon. -Check blood test results and communicate with patient via MyChart.          Meds ordered this encounter  Medications   ergocalciferol (DRISDOL) 1.25 MG (50000 UT) capsule    Sig: Take 1 capsule (50,000 Units total) by mouth once a week.    Dispense:  12 capsule    Refill:  1   gabapentin (NEURONTIN) 300 MG capsule    Sig: Take 1 capsule  (300 mg total) by mouth 2 (two) times daily.    Dispense:  90 capsule    Refill:  1   isosorbide mononitrate (  IMDUR) 30 MG 24 hr tablet    Sig: Take 1 tablet (30 mg total) by mouth daily.    Dispense:  90 tablet    Refill:  1    Follow-up: Return in about 3 months (around 09/08/2023) for Chronic medical conditions.       [name], MD, FAAFP. [facility] [place], [place] [phone]   06/10/2023, 12:33 PM

## 2023-06-11 DIAGNOSIS — I110 Hypertensive heart disease with heart failure: Secondary | ICD-10-CM | POA: Diagnosis not present

## 2023-06-11 DIAGNOSIS — I5042 Chronic combined systolic (congestive) and diastolic (congestive) heart failure: Secondary | ICD-10-CM | POA: Diagnosis not present

## 2023-06-11 DIAGNOSIS — I480 Paroxysmal atrial fibrillation: Secondary | ICD-10-CM | POA: Diagnosis not present

## 2023-06-11 DIAGNOSIS — T8141XD Infection following a procedure, superficial incisional surgical site, subsequent encounter: Secondary | ICD-10-CM | POA: Diagnosis not present

## 2023-06-11 DIAGNOSIS — J9621 Acute and chronic respiratory failure with hypoxia: Secondary | ICD-10-CM | POA: Diagnosis not present

## 2023-06-11 DIAGNOSIS — A419 Sepsis, unspecified organism: Secondary | ICD-10-CM | POA: Diagnosis not present

## 2023-06-11 DIAGNOSIS — I4892 Unspecified atrial flutter: Secondary | ICD-10-CM | POA: Diagnosis not present

## 2023-06-11 DIAGNOSIS — J449 Chronic obstructive pulmonary disease, unspecified: Secondary | ICD-10-CM | POA: Diagnosis not present

## 2023-06-11 DIAGNOSIS — K651 Peritoneal abscess: Secondary | ICD-10-CM | POA: Diagnosis not present

## 2023-06-11 LAB — CBC WITH DIFFERENTIAL/PLATELET
Basophils Absolute: 0.1 10*3/uL (ref 0.0–0.2)
Basos: 1 %
EOS (ABSOLUTE): 0.1 10*3/uL (ref 0.0–0.4)
Eos: 2 %
Hematocrit: 33.9 % — ABNORMAL LOW (ref 37.5–51.0)
Hemoglobin: 10.2 g/dL — ABNORMAL LOW (ref 13.0–17.7)
Immature Grans (Abs): 0 10*3/uL (ref 0.0–0.1)
Immature Granulocytes: 0 %
Lymphocytes Absolute: 2.7 10*3/uL (ref 0.7–3.1)
Lymphs: 34 %
MCH: 26.1 pg — ABNORMAL LOW (ref 26.6–33.0)
MCHC: 30.1 g/dL — ABNORMAL LOW (ref 31.5–35.7)
MCV: 87 fL (ref 79–97)
Monocytes Absolute: 0.6 10*3/uL (ref 0.1–0.9)
Monocytes: 8 %
Neutrophils Absolute: 4.6 10*3/uL (ref 1.4–7.0)
Neutrophils: 55 %
Platelets: 332 10*3/uL (ref 150–450)
RBC: 3.91 x10E6/uL — ABNORMAL LOW (ref 4.14–5.80)
RDW: 14.3 % (ref 11.6–15.4)
WBC: 8.1 10*3/uL (ref 3.4–10.8)

## 2023-06-11 LAB — BASIC METABOLIC PANEL
BUN/Creatinine Ratio: 15 (ref 10–24)
BUN: 13 mg/dL (ref 8–27)
CO2: 25 mmol/L (ref 20–29)
Calcium: 9.4 mg/dL (ref 8.6–10.2)
Chloride: 100 mmol/L (ref 96–106)
Creatinine, Ser: 0.89 mg/dL (ref 0.76–1.27)
Glucose: 107 mg/dL — ABNORMAL HIGH (ref 70–99)
Potassium: 3.4 mmol/L — ABNORMAL LOW (ref 3.5–5.2)
Sodium: 142 mmol/L (ref 134–144)
eGFR: 93 mL/min/{1.73_m2} (ref >59)

## 2023-06-13 ENCOUNTER — Other Ambulatory Visit: Payer: Self-pay

## 2023-06-13 ENCOUNTER — Ambulatory Visit: Payer: Self-pay

## 2023-06-13 NOTE — Patient Instructions (Signed)
 Visit Information  Thank you for taking time to visit with me today. Please don't hesitate to contact me if I can be of assistance to you.   Following are the goals we discussed today:  SW will email list of housing options. Patient will contact housing options to be added to the waiting list.    If you are experiencing a Mental Health or Behavioral Health Crisis or need someone to talk to, please call [phone]  Patient verbalizes understanding of instructions and care plan provided today and agrees to view in MyChart. Active MyChart status and patient understanding of how to access instructions and care plan via MyChart confirmed with patient.     No further follow up required: Patient does not request a follow up visit.  Tillman Gardener, BSW Social Worker [phone]

## 2023-06-13 NOTE — Patient Outreach (Signed)
  Care Coordination   Initial Visit Note   06/13/2023 Name: Jared [name] MRN: [id number] DOB: 02/03/1956  Jared [name] is a 68 y.o. year old male who sees Newlin, Enobong, MD for primary care. I spoke with  Garen Pa by phone today.  What matters to the patients health and wellness today?  Patient request housing information.    Goals Addressed             This Visit's Progress    Housing Information       Interventions Today    Flowsheet Row Most Recent Value  Chronic Disease   Chronic disease during today's visit Diabetes, Chronic Obstructive Pulmonary Disease (COPD), Hypertension (HTN), Atrial Fibrillation (AFib), Chronic Kidney Disease/End Stage Renal Disease (ESRD)  General Interventions   General Interventions Discussed/Reviewed General Interventions Discussed, General Interventions Reviewed, [employer] has foodstamps and $325 [employer]. In Home care assessment scheduled for today. Transportation with Medicaid DSS. Pt request housing options. SW to email list of senior income based housing.]              SDOH assessments and interventions completed:  Yes  SDOH Interventions Today    Flowsheet Row Most Recent Value  SDOH Interventions   Food Insecurity Interventions Intervention Not Indicated, Other (Comment)  [FNS and Ucard]  Housing Interventions Intervention Not Indicated  Transportation Interventions Intervention Not Indicated, Other (Comment)  [Medicaid Transportation and sister assists]        Care Coordination Interventions:  Yes, provided   Follow up plan: No further intervention required.   Encounter Outcome:  Patient Visit Completed

## 2023-06-14 ENCOUNTER — Other Ambulatory Visit: Payer: Self-pay

## 2023-06-17 ENCOUNTER — Inpatient Hospital Stay: Payer: Medicare HMO | Attending: Nurse Practitioner

## 2023-06-17 DIAGNOSIS — K274 Chronic or unspecified peptic ulcer, site unspecified, with hemorrhage: Secondary | ICD-10-CM | POA: Diagnosis not present

## 2023-06-17 DIAGNOSIS — D500 Iron deficiency anemia secondary to blood loss (chronic): Secondary | ICD-10-CM | POA: Diagnosis not present

## 2023-06-17 LAB — CBC WITH DIFFERENTIAL (CANCER CENTER ONLY)
Abs Immature Granulocytes: 0.03 10*3/uL (ref 0.00–0.07)
Basophils Absolute: 0 10*3/uL (ref 0.0–0.1)
Basophils Relative: 0 %
Eosinophils Absolute: 0.1 10*3/uL (ref 0.0–0.5)
Eosinophils Relative: 2 %
HCT: 37.6 % — ABNORMAL LOW (ref 39.0–52.0)
Hemoglobin: 11.1 g/dL — ABNORMAL LOW (ref 13.0–17.0)
Immature Granulocytes: 0 %
Lymphocytes Relative: 38 %
Lymphs Abs: 3.5 10*3/uL (ref 0.7–4.0)
MCH: 26.6 pg (ref 26.0–34.0)
MCHC: 29.5 g/dL — ABNORMAL LOW (ref 30.0–36.0)
MCV: 90 fL (ref 80.0–100.0)
Monocytes Absolute: 0.6 10*3/uL (ref 0.1–1.0)
Monocytes Relative: 6 %
Neutro Abs: 5 10*3/uL (ref 1.7–7.7)
Neutrophils Relative %: 54 %
Platelet Count: 328 10*3/uL (ref 150–400)
RBC: 4.18 MIL/uL — ABNORMAL LOW (ref 4.22–5.81)
RDW: 15.2 % (ref 11.5–15.5)
WBC Count: 9.2 10*3/uL (ref 4.0–10.5)
nRBC: 0 % (ref 0.0–0.2)

## 2023-06-17 LAB — IRON AND IRON BINDING CAPACITY (CC-WL,HP ONLY)
Iron: 83 ug/dL (ref 45–182)
Saturation Ratios: 25 % (ref 17.9–39.5)
TIBC: 330 ug/dL (ref 250–450)
UIBC: 247 ug/dL (ref 117–376)

## 2023-06-17 LAB — FERRITIN: Ferritin: 12 ng/mL — ABNORMAL LOW (ref 24–336)

## 2023-06-19 DIAGNOSIS — I480 Paroxysmal atrial fibrillation: Secondary | ICD-10-CM | POA: Diagnosis not present

## 2023-06-19 DIAGNOSIS — I5042 Chronic combined systolic (congestive) and diastolic (congestive) heart failure: Secondary | ICD-10-CM | POA: Diagnosis not present

## 2023-06-19 DIAGNOSIS — I110 Hypertensive heart disease with heart failure: Secondary | ICD-10-CM | POA: Diagnosis not present

## 2023-06-19 DIAGNOSIS — K651 Peritoneal abscess: Secondary | ICD-10-CM | POA: Diagnosis not present

## 2023-06-19 DIAGNOSIS — A419 Sepsis, unspecified organism: Secondary | ICD-10-CM | POA: Diagnosis not present

## 2023-06-19 DIAGNOSIS — T8141XD Infection following a procedure, superficial incisional surgical site, subsequent encounter: Secondary | ICD-10-CM | POA: Diagnosis not present

## 2023-06-19 DIAGNOSIS — J449 Chronic obstructive pulmonary disease, unspecified: Secondary | ICD-10-CM | POA: Diagnosis not present

## 2023-06-19 DIAGNOSIS — J9621 Acute and chronic respiratory failure with hypoxia: Secondary | ICD-10-CM | POA: Diagnosis not present

## 2023-06-19 DIAGNOSIS — I4892 Unspecified atrial flutter: Secondary | ICD-10-CM | POA: Diagnosis not present

## 2023-06-19 NOTE — Progress Notes (Signed)
 Cardiology Office Note:  .   Date:  06/20/2023  ID:  Jared [name], DOB [birth year], MRN [id number] PCP: Jared Clam, MD  [facility] Providers Cardiologist:  Jared Gull, MD {   History of Present Illness: .   Jared [name] is a 68 y.o. male with a history of CAD (mild by cardiac cath 2012 Myoview in 2016 with no ischemia), chronic combined systolic and diastolic heart failure, CVA, hypertension, hyperlipidemia, PAF on Xarelto , COPD (on chronic oxygen ) and tobacco abuse here for follow-up appointment.  History includes echocardiogram in 2018 with LVEF 30 to 35% up to 45 to 50% by echo 04/2018.  Patient was seen by Dr. Gull in the clinic August 2023.  He had a PTCA/stent to the left femoropopliteal in June 2024.  Seen in the ER for anemia.  EGD showed no active bleed.  Ongoing Fe infusions.  When he was last seen October 2024 he denied chest pain.  Breathing was stable.  On oxygen  chronically.  He is still smoking.  Trying to quit.  Occasional palpitations which are brief.  No dizziness.  Today, he presents with a history of COPD, atrial flutter, and a previous stroke, presents for a follow-up after a recent hospitalization and rehab stay. He was admitted for sepsis secondary to a ruptured appendix, which required surgical intervention. He denies any cardiac issues during his hospital stay and has been recovering well post-operatively. He reports occasional chest pain, approximately once a week, but denies any significant change in his baseline shortness of breath due to COPD. He also reports occasional palpitations, described as a 'double kicking' sensation, but these are infrequent and not daily. He denies any recent GI bleeds and reports no residual symptoms from his previous stroke.  No edema, orthopnea, PND.   Discussed the use of AI scribe software for clinical note transcription with the patient, who gave verbal consent to proceed.   ROS: pertinent ROS in HPI  Studies Reviewed: .         July 2023 1. Left ventricular ejection fraction, by estimation, is 45 to 50%. The  left ventricle has mildly decreased function. The left ventricle  demonstrates global hypokinesis. There is mild concentric left ventricular  hypertrophy. Left ventricular diastolic  parameters are consistent with Grade I diastolic dysfunction (impaired  relaxation).   2. Right ventricular systolic function is normal. The right ventricular  size is normal.   3. The mitral valve is normal in structure. No evidence of mitral valve  regurgitation. No evidence of mitral stenosis.   4. The aortic valve is normal in structure. Aortic valve regurgitation is  not visualized. Aortic valve sclerosis/calcification is present, without  any evidence of aortic stenosis.   5. The inferior vena cava is normal in size with greater than 50%  respiratory variability, suggesting right atrial pressure of 3 mmHg.        Physical Exam:   VS:  BP 102/60 (BP Location: Right Arm, Patient Position: Sitting, Cuff Size: Large)   Pulse (!) 106   Resp 16   Ht 6' 3 (1.905 m)   Wt 204 lb 6.4 oz (92.7 kg)   SpO2 96%   BMI 25.55 kg/m    Wt Readings from Last 3 Encounters:  06/20/23 204 lb 6.4 oz (92.7 kg)  06/10/23 202 lb 6.4 oz (91.8 kg)  05/15/23 242 lb 11.6 oz (110.1 kg)    GEN: Well nourished, well developed in no acute distress NECK: No JVD; No carotid bruits CARDIAC: ALLA,  no murmurs, rubs, gallops RESPIRATORY:  Clear to auscultation without rales, wheezing or rhonchi  ABDOMEN: Soft, non-tender, non-distended EXTREMITIES:  No edema; No deformity   ASSESSMENT AND PLAN: .   HRmrEF -euvolemic on exam -continue current medications  Appendicitis Recent hospitalization for ruptured appendix with subsequent sepsis. Treated with IV antibiotics and appendectomy. No cardiac issues during hospitalization. -Continue recovery at home with home health services.  COPD Stable, chronic shortness of breath. Current smoker  with recent relapse after hospitalization. -Encouraged to continue efforts to quit smoking. Offered referral to child psychotherapist for smoking cessation support, but patient declined at this time.  Atrial Flutter Occasional palpitations, described as double kicking. Recent EKG showed sinus tachycardia with PVCs, no Aflutter or Afib. -No EKG today, HR is somewhat irregular rate around 100 bpm. Asymptomatic.  -Continue Xarelto  20mg  daily. Monitor for increased frequency or duration of palpitations.  Chest Pain Occasional, brief episodes, approximately once a week. No nitroglycerin  use. -Continue current regimen. If chest pain becomes more frequent or longer in duration, consider decreasing Hydralazine  and increasing Imdur .  Hyperlipidemia Last labs in May showed good control with Lipitor  80mg  and Zetia  10mg . -Plan to check cholesterol levels in May.  General Health Maintenance / Followup Plans -Continue current medications as prescribed. -Consider smoking cessation support if patient is interested in the future.      Dispo: 6 months with Dr. Okey  Signed, Jared [name], Jared [name]

## 2023-06-20 ENCOUNTER — Ambulatory Visit: Payer: Medicare HMO | Attending: Physician Assistant | Admitting: Physician Assistant

## 2023-06-20 ENCOUNTER — Encounter: Payer: Self-pay | Admitting: Physician Assistant

## 2023-06-20 VITALS — BP 102/60 | HR 106 | Resp 16 | Ht 75.0 in | Wt 204.4 lb

## 2023-06-20 DIAGNOSIS — D500 Iron deficiency anemia secondary to blood loss (chronic): Secondary | ICD-10-CM | POA: Diagnosis not present

## 2023-06-20 DIAGNOSIS — I10 Essential (primary) hypertension: Secondary | ICD-10-CM

## 2023-06-20 DIAGNOSIS — I2510 Atherosclerotic heart disease of native coronary artery without angina pectoris: Secondary | ICD-10-CM

## 2023-06-20 DIAGNOSIS — J449 Chronic obstructive pulmonary disease, unspecified: Secondary | ICD-10-CM

## 2023-06-20 DIAGNOSIS — I639 Cerebral infarction, unspecified: Secondary | ICD-10-CM | POA: Diagnosis not present

## 2023-06-20 DIAGNOSIS — I70229 Atherosclerosis of native arteries of extremities with rest pain, unspecified extremity: Secondary | ICD-10-CM | POA: Diagnosis not present

## 2023-06-20 DIAGNOSIS — E782 Mixed hyperlipidemia: Secondary | ICD-10-CM | POA: Diagnosis not present

## 2023-06-20 DIAGNOSIS — I4892 Unspecified atrial flutter: Secondary | ICD-10-CM

## 2023-06-20 DIAGNOSIS — I5020 Unspecified systolic (congestive) heart failure: Secondary | ICD-10-CM | POA: Diagnosis not present

## 2023-06-20 NOTE — Patient Instructions (Signed)
 Medication Instructions:  No changes *If you need a refill on your cardiac medications before your next appointment, please call your pharmacy*   Lab Work: Lipid panel in May with Primary  If you have labs (blood work) drawn today and your tests are completely normal, you will receive your results only by: MyChart Message (if you have MyChart) OR A paper copy in the mail If you have any lab test that is abnormal or we need to change your treatment, we will call you to review the results.   Testing/Procedures: None needed   Follow-Up: At [facility], you and your health needs are our priority.  As part of our continuing mission to provide you with exceptional heart care, we have created designated Provider Care Teams.  These Care Teams include your primary Cardiologist (physician) and Advanced Practice Providers (APPs -  Physician Assistants and Nurse Practitioners) who all work together to provide you with the care you need, when you need it.    Your next appointment:   6 month(s)  Provider:   Vina Gull, MD

## 2023-06-25 ENCOUNTER — Other Ambulatory Visit: Payer: Self-pay

## 2023-06-25 ENCOUNTER — Other Ambulatory Visit: Payer: Self-pay | Admitting: Family Medicine

## 2023-06-25 DIAGNOSIS — K250 Acute gastric ulcer with hemorrhage: Secondary | ICD-10-CM

## 2023-06-25 MED ORDER — PANTOPRAZOLE SODIUM 40 MG PO TBEC
40.0000 mg | DELAYED_RELEASE_TABLET | Freq: Every day | ORAL | 1 refills | Status: DC
Start: 2023-06-25 — End: 2023-09-09
  Filled 2023-06-25: qty 90, 90d supply, fill #0

## 2023-06-26 ENCOUNTER — Other Ambulatory Visit: Payer: Self-pay

## 2023-06-28 DIAGNOSIS — I4892 Unspecified atrial flutter: Secondary | ICD-10-CM | POA: Diagnosis not present

## 2023-06-28 DIAGNOSIS — J449 Chronic obstructive pulmonary disease, unspecified: Secondary | ICD-10-CM | POA: Diagnosis not present

## 2023-06-28 DIAGNOSIS — K651 Peritoneal abscess: Secondary | ICD-10-CM | POA: Diagnosis not present

## 2023-06-28 DIAGNOSIS — I5042 Chronic combined systolic (congestive) and diastolic (congestive) heart failure: Secondary | ICD-10-CM | POA: Diagnosis not present

## 2023-06-28 DIAGNOSIS — I110 Hypertensive heart disease with heart failure: Secondary | ICD-10-CM | POA: Diagnosis not present

## 2023-06-28 DIAGNOSIS — T8141XD Infection following a procedure, superficial incisional surgical site, subsequent encounter: Secondary | ICD-10-CM | POA: Diagnosis not present

## 2023-06-28 DIAGNOSIS — A419 Sepsis, unspecified organism: Secondary | ICD-10-CM | POA: Diagnosis not present

## 2023-06-28 DIAGNOSIS — I480 Paroxysmal atrial fibrillation: Secondary | ICD-10-CM | POA: Diagnosis not present

## 2023-06-28 DIAGNOSIS — J9621 Acute and chronic respiratory failure with hypoxia: Secondary | ICD-10-CM | POA: Diagnosis not present

## 2023-07-01 ENCOUNTER — Telehealth: Payer: Self-pay

## 2023-07-01 NOTE — Telephone Encounter (Signed)
 Completed PCS request faxed to [place] [facility]

## 2023-07-03 NOTE — Progress Notes (Signed)
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 male with medical history significant for CABG, hypertension, prior history of stroke without residual, systolic heart failure, current tobacco abuse, chronic headaches, presenting to the emergency department with generalized weakness, and increased shortness of breath, along with  white sputum production. He also reports dyspnea on exertion. In addition, he reports epigastric abdominal pain, which may radiate to the lower portion of his sternum versus chest pain . He denies any lower extremity edema.  Admits to salt rich food ingestion .The symptoms are similar to those of one week ago, at which time, workup was suspicious for acute exacerbation of systolic heart failure. She was started on Cozaar, metoprolol and Lasix 20 mg daily at the time, and was recommended to see cardiology, which he failed to do so. His cardiologist is Dr. Dietrich [name], last seen in December 2016.the patient continues to smoke 1 pack a day of cigarettes   Discharge Diagnoses:  Acute respiratory failure secondary to CHF exacerbation -O2 sats upon admission 89% -CXR unremarkable for infection -Responded well to IV lasix-->po lasix -Weaned off of O2-SpO2 95% on room air   Acute combined systolic/diastolic heart failure -BNP 2056.8 -Echocardiogram 09/20/2014 showed EF 40-45% -Transition to by mouth Lasix 20 mg daily -Echocardiogram 01/10/2016: EF 35-40%, diastolic dysfunction --AST 147, ALT 110 upon admission -Currently trending downward -hepatitis panel unremarkable -Patient denies drug/alcohol  use -switch metoprolol-->coreg -continue losartan   Essential Hypertension -Has not been taking home medications for over a year due to lack of insurance and monetary funds -Restarted on losartan,  Lasix --switch metoprolol-->coreg 6.25 mg bid   CAD -Currently no chest pain -Continue coreg, losartan, ASA 81 mg daily -LDL 80   Tobacco Abuse -Smoking cessation discussed -Continue nicotine patch   Moderate malnutrition -Nutrition consult   History of Present Illness  02/29/2016 1st [facility] office visit/ Jared [name]  symb 160 2bid/ bid saba (not prn)  Chief Complaint  Patient presents with   Pulmonary Consult    Pt referred by Jared Newcomer, PA, for cough, hemoptysis, and emphysema. Pt c/o hemoptysis x 3 weeks, pt states this occurs daily but has been improving. Pt c/o increase in SOB with activity and rest.   acute  onset epistaxis/ hemoptyis end of Auguest > ER eval 02/20/16 dx RLL pna by CTa chest > rx levaquin/ prednisone and minimal hemoptysis now  Doe = MMRC2 = can't walk a nl pace on a flat grade s sob but does fine slow and flat eg [employer] shopping rec Plan A = Automatic =  Symbicort 160 Take 2 puffs first thing in am and then another 2 puffs about 12 hours later.  Work on inhaler technique: Plan B = Backup Only use your albuterol as a rescue medication\ Plan C = Crisis - only use your albuterol nebulizer if you first try Plan B and it fails to help > ok to use the nebulizer up to every 4 hours but if start needing it regularly call for immediate appointment  The key is to stop smoking completely  before smoking completely stops you!  Please schedule a follow up office visit in 2 weeks, sooner if needed with cxr on return > did not return   Admit date: 05/04/2016 Discharge date: 05/09/2016     DISCHARGE DIAGNOSES:  Principal Problem:   Hemoptysis Active Problems:   Community acquired pneumonia of right lower lobe of lung (HCC) - - FOB RLL tbbx ATYPICAL PNEUMOCYTES  PROLIFERATION/ LLL BAL 05/08/16    Hypertension   COPD with acute exacerbation (HCC)   Nonischemic dilated cardiomyopathy (HCC)   Cigarette smoker   Chronic systolic CHF (congestive heart failure) (HCC)   06/03/16 to ER> only better while on neb    06/26/2016  f/u ov/Jared [name] re:  GOLD III copd / maint symb 160 / saba  Chief Complaint  Patient presents with   Follow-up    pt had CT today. pt states breathing has worsen since last OV. pt c/o increased sob with exertion, prod cough with white mucus, wheezing & chest tightness,  breathing was worse w/in 2 days of last ov / then ran out of lasix one week ago and swelling started to get worse Also worse orthopnea ? Related to leg swelling in terms of timing but pt not sure  Doe now = MMRC3 = can't walk 100 yards even at a slow pace at a flat grade s stopping due to sob  rec Bisoprolol 5 mg one daily in place of the twice daily corevidol and resume your lasix and potassium  Plan A = Automatic = stop symbicort and start bevespi Take 2 puffs first thing in am and then another 2 puffs about 12 hours later.  Plan B = Backup Only use your albuterol as a rescue medication The key is to stop smoking completely before smoking completely stops you!  Please schedule a follow up office visit in 4 weeks, sooner if needed bring all meds     01/01/2022  f/u ov/Jared [name] re: GOLD 3 copd /0 2 dep   maint on breztri   Chief Complaint  Patient presents with   Follow-up    Breathing is unchanged. He has noticed occ wheezing. He has some prod cough with white sputum. He is using his albuterol inhaler about 2 x per day.    Dyspnea:  PT working with him walking up to 2 m at a time in house and feels this is helping Cough: min in am / white  Sleeping: flat bed one pillow  SABA use: as above  02: prn 2lpm  Lung cancer screeing yearly in Feb due  Rec Plan A = Automatic = Always= Breztri Take 2 puffs first thing in am and then another 2 puffs about 12 hours later.   Plan B = Backup (to supplement plan A, not to replace it) Only use your albuterol inhaler as a rescue medication  Plan C = Crisis (instead of Plan B but only if Plan B stops working) - only use your albuterol nebulizer if you first try Plan B  Make sure you check your oxygen saturation  at your highest level of activity  to be sure it stays over 90%    07/04/2022  f/u ov/Shealyn Sean re: GOLD 3/02 dep  maint on breztri   Chief Complaint  Patient presents with   Follow-up    Some cough and still SOB with exertion.   Dyspnea:  room to room at home,  now limited by L knee pain  Cough: min am mucoid  Sleeping: flat bed one  pillow SABA use: not using pre ex 02: 2lpm 25/7 does not check with ex  Covid status:   vax x 4 or 5 / never infected  Lung cancer screening :  in program   Rec Dr Magnus Ivan is your orthopedic surgeon Plan A = Automatic = Always= Breztri Take 2 puffs first thing in am and then another 2 puffs about 12 hours later.   Plan B = Backup (to supplement plan A, not to replace it) Only use your albuterol inhaler as a rescue medication  Plan C = Crisis (instead of Plan B but only if Plan B stops working) - only use your albuterol nebulizer if you first try Plan B  Make sure you check your oxygen saturation  at your highest level of activity  to be sure it stays over 90%  We will refer you again for best fit for portable oxygen for [employer] - please write the name and number of whomever you talk to there if you are not satisfied        07/04/2023  f/u ov/Breasia Karges re: COPD GOLD 3/ 02 dep  maint on breztri  / still smoking  Chief Complaint  Patient presents with   Follow-up    In hospital for COPD and appendix rupture.  C/o SOB persistent   Dyspnea:  rollator  just leaves home to go to doctor / L leg claudication  Cough: none  Sleeping: flat bed one pillow s  resp cc  SABA use: rarely hfa/ neb  2-3 x per month 02: 2lpm conc hs and resting/  3lpm POC for ambulation   Lung cancer  screening :  aug 2025     No obvious day to day or daytime variability or assoc excess/ purulent sputum or mucus plugs or hemoptysis or cp or chest tightness, subjective wheeze or overt sinus or hb symptoms.    Also denies any obvious fluctuation of symptoms with weather or environmental changes or other aggravating or alleviating factors except as outlined above   No unusual exposure hx or h/o childhood pna/ asthma or knowledge of premature birth.  Current Allergies, Complete Past Medical History, Past Surgical History, Family History, and Social History were reviewed in [employer] record.  ROS  The following are not active complaints unless bolded Hoarseness, sore throat, dysphagia, dental problems, itching, sneezing,  nasal congestion or discharge of excess mucus or purulent secretions, ear ache,   fever, chills, sweats, unintended wt loss or wt gain, classically pleuritic or exertional cp,  orthopnea pnd or arm/hand swelling  or leg swelling, presyncope, palpitations, abdominal pain, anorexia, nausea, vomiting, diarrhea  or change in bowel habits or change in bladder habits, change in stools or change in urine, dysuria, hematuria,  rash, arthralgias, visual complaints, headache, numbness, weakness or ataxia or problems with walking or coordination,  change in mood or  memory.        Current Meds  Medication Sig   Accu-Chek Softclix Lancets lancets Use to check blood sugar once daily.   acetaminophen (TYLENOL) 500 MG tablet Take 2 tablets (1,000 mg total) by mouth every 8 (eight) hours as needed for moderate pain.   albuterol (PROVENTIL) (2.5 MG/3ML) 0.083% nebulizer solution Take 3 mLs (2.5 mg total) by nebulization every 2 (two) hours as needed for wheezing or shortness of breath.   albuterol (VENTOLIN HFA) 108 (90 Base) MCG/ACT inhaler INHALE 2 PUFFS INTO THE LUNGS EVERY 6 (SIX) HOURS AS NEEDED FOR WHEEZING OR SHORTNESS OF BREATH.  atorvastatin (LIPITOR) 80 MG  tablet Take 1 tablet (80 mg total) by mouth daily.   Blood Glucose Monitoring Suppl (ACCU-CHEK GUIDE) w/Device KIT Use to check blood sugar once daily.   Budeson-Glycopyrrol-Formoterol (BREZTRI AEROSPHERE) 160-9-4.8 MCG/ACT AERO Take 2 puffs first thing in morning and then another 2 puffs about 12 hours later.   DULoxetine (CYMBALTA) 60 MG capsule Take 1 capsule (60 mg total) by mouth daily. For chronic leg pains   ergocalciferol (DRISDOL) 1.25 MG (50000 UT) capsule Take 1 capsule (50,000 Units total) by mouth once a week.   ezetimibe (ZETIA) 10 MG tablet Take 1 tablet (10 mg total) by mouth daily.   feeding supplement (ENSURE ENLIVE / ENSURE PLUS) LIQD Take 237 mLs by mouth 2 (two) times daily between meals.   ferrous sulfate (FEROSUL) 325 (65 FE) MG tablet Take 1 tablet (325 mg total) by mouth 2 (two) times daily with a meal.   folic acid (FOLVITE) 1 MG tablet Take 1 tablet (1 mg total) by mouth daily.   furosemide (LASIX) 40 MG tablet Take 1 tablet (40 mg total) by mouth daily.   gabapentin (NEURONTIN) 300 MG capsule Take 1 capsule (300 mg total) by mouth 2 (two) times daily.   glucose blood (ACCU-CHEK GUIDE) test strip Use to check blood sugar once daily.   hydrALAZINE (APRESOLINE) 50 MG tablet Take 1 tablet (50 mg total) by mouth every 8 (eight) hours.   isosorbide mononitrate (IMDUR) 30 MG 24 hr tablet Take 1 tablet (30 mg total) by mouth daily.   metoprolol succinate (TOPROL-XL) 50 MG 24 hr tablet Take 1.5 tablets (75 mg total) by mouth daily. Take with or immediately following a meal.   Multiple Vitamin (MULTIVITAMIN WITH MINERALS) TABS tablet Take 1 tablet by mouth daily.   oxyCODONE (OXY IR/ROXICODONE) 5 MG immediate release tablet Take 1-2 tablets (5-10 mg total) by mouth every 6 (six) hours as needed for moderate pain (pain score 4-6) or severe pain (pain score 7-10) (5 mg for 4-6/10 pain. 10 mg for >6/10 pain).   OXYGEN Inhale 2 L/min into the lungs continuous.   pantoprazole  (PROTONIX) 40 MG tablet Take 1 tablet (40 mg total) by mouth daily.   polyethylene glycol (MIRALAX / GLYCOLAX) 17 g packet Take 17 g by mouth daily.   rivaroxaban (XARELTO) 20 MG TABS tablet Take 1 tablet (20 mg total) by mouth daily with supper.                            Objective:   Physical Exam   Wts  07/04/2023     205  07/04/2022     212  01/01/2022      223 06/27/2021      226  04/03/2021    224  10/04/2020      217 04/04/2020    212 06/30/2019      189  01/28/2019      191 12/31/2018      189  10/22/2016      190  07/24/2016        209  06/26/2016        213  05/28/2016      194   02/29/16 177 lb 12.8 oz (80.6 kg)  02/24/16 182 lb 12.8 oz (82.9 kg)  02/20/16 175 lb (79.4 kg)    Vital signs reviewed  07/04/2023  - Note at rest 02 sats  93% on 3lpm poc    General  appearance:    elderly amb bm nad   HEENT :  Oropharynx  clear   Nasal turbinates nl    NECK :  without JVD/Nodes/TM/ nl carotid upstrokes bilaterally   LUNGS: no acc muscle use,  Mod barrel  contour chest wall with bilateral  Distant bs s audible wheeze and  without cough on insp or exp maneuvers and mod  Hyperresonant  to  percussion bilaterally     CV:  RRR  no s3 or murmur or increase in P2, and no edema   ABD:  soft and nontender with pos mid insp Hoover's  in the supine position. No bruits or organomegaly appreciated, bowel sounds nl  MS:   Ext warm without deformities or   obvious joint restrictions , calf tenderness, cyanosis or clubbing  SKIN: warm and dry without lesions    NEURO:  alert, approp, nl sensorium with  no motor or cerebellar deficits apparent.                      Assessment & Plan:

## 2023-07-04 ENCOUNTER — Encounter: Payer: Self-pay | Admitting: Internal Medicine

## 2023-07-04 ENCOUNTER — Ambulatory Visit (INDEPENDENT_AMBULATORY_CARE_PROVIDER_SITE_OTHER): Payer: Medicare HMO | Admitting: Internal Medicine

## 2023-07-04 VITALS — BP 122/60 | HR 73 | Temp 98.3°F | Ht 65.0 in | Wt 205.2 lb

## 2023-07-04 DIAGNOSIS — F17210 Nicotine dependence, cigarettes, uncomplicated: Secondary | ICD-10-CM | POA: Diagnosis not present

## 2023-07-04 DIAGNOSIS — I110 Hypertensive heart disease with heart failure: Secondary | ICD-10-CM | POA: Diagnosis not present

## 2023-07-04 DIAGNOSIS — J449 Chronic obstructive pulmonary disease, unspecified: Secondary | ICD-10-CM | POA: Diagnosis not present

## 2023-07-04 DIAGNOSIS — I4892 Unspecified atrial flutter: Secondary | ICD-10-CM | POA: Diagnosis not present

## 2023-07-04 DIAGNOSIS — I480 Paroxysmal atrial fibrillation: Secondary | ICD-10-CM | POA: Diagnosis not present

## 2023-07-04 DIAGNOSIS — J9611 Chronic respiratory failure with hypoxia: Secondary | ICD-10-CM | POA: Diagnosis not present

## 2023-07-04 DIAGNOSIS — I5042 Chronic combined systolic (congestive) and diastolic (congestive) heart failure: Secondary | ICD-10-CM | POA: Diagnosis not present

## 2023-07-04 DIAGNOSIS — K651 Peritoneal abscess: Secondary | ICD-10-CM | POA: Diagnosis not present

## 2023-07-04 DIAGNOSIS — J9621 Acute and chronic respiratory failure with hypoxia: Secondary | ICD-10-CM | POA: Diagnosis not present

## 2023-07-04 DIAGNOSIS — T8141XD Infection following a procedure, superficial incisional surgical site, subsequent encounter: Secondary | ICD-10-CM | POA: Diagnosis not present

## 2023-07-04 DIAGNOSIS — J9612 Chronic respiratory failure with hypercapnia: Secondary | ICD-10-CM

## 2023-07-04 DIAGNOSIS — A419 Sepsis, unspecified organism: Secondary | ICD-10-CM | POA: Diagnosis not present

## 2023-07-04 NOTE — Patient Instructions (Signed)
No change in medications   Please schedule a follow up visit in 12  months but call sooner if needed  

## 2023-07-06 ENCOUNTER — Encounter: Payer: Self-pay | Admitting: Internal Medicine

## 2023-07-06 NOTE — Assessment & Plan Note (Signed)
Active smoker 02/29/2016  After extensive coaching HFA effectiveness =    75% > continue symbicort  - Spirometry 05/28/2016  FEV1 1.48 (40%)  Ratio 46  p am symbicort 160 2bid  - 06/26/2016  After extensive coaching HFA effectiveness =    90% > bevespi 2 bid and change coreg to bisoprolol 5 mg daily > not done as of 07/24/2016 > at ov 10/22/2016 off coreg and all inhalers and much better sob but still some cough on entresto maint - Spirometry 10/22/2016  FEV1 1.24 (35%)  Ratio 48 - 12/31/2018    tudorza rx one puff bid  - 01/28/2019  After extensive coaching inhaler device,  effectiveness =    90% with tudorza  03/09/2019 Reports improvement with [name] but not covered, refer to [facility]. May need PA processed- he has been on bevespi and spiriva in the past  - 06/30/2019  After extensive coaching inhaler device,  effectiveness =    90% with elipta try anoro one each am  PFT's  04/04/2020  FEV1 1.25 (35 % ) ratio 0.41  p 6 % improvement from saba p tudorza prior to study with DLCO  8.26 (27%) corrects to 1.38 (34%)  for alv volume and FV curve classic concave pattern  - 04/04/2020   breztri 2bid  07/04/2023  After extensive coaching inhaler device,  effectiveness =    75% (short Ti) > continue breztri  . Group D (now reclassified as E) in terms of symptom/risk and laba/lama/ICS  therefore appropriate rx at this point >>>  breztri and approp saba

## 2023-07-06 NOTE — Assessment & Plan Note (Addendum)
4-5 min discussion re active cigarette smoking in addition to office E&M ? ?Ask about tobacco use:   ongoing ?Advise quitting  I took an extended  opportunity with this patient to outline the consequences of continued cigarette use  in airway disorders based on all the data we have from the multiple national lung health studies (perfomed over decades at millions of dollars in cost)  indicating that smoking cessation, not choice of inhalers or pulmonaryphysicians, is the most important aspect of his care.   ?Assess willingness:  Not committed at this point ?Assist in quit attempt:  Per PCP when ready ?Arrange follow up:   Follow up per Primary Care planned  ?  ?  ?  ?

## 2023-07-06 NOTE — Assessment & Plan Note (Addendum)
See copd - 10/22/2016  Walked RA x 3 laps @ 185 ft each stopped due to  End of study moderate pace, no desat  - HCO03  11/11/18  = 41  - 12/31/2018   Walked 2lpm   2 laps @  approx 210ft each @ avg pace  stopped due to  Sob on 2lpm but no desats   - 01/28/2019   Walked RA x one lap =  approx 250 ft - stopped due to  Sob with sats ok on 3lpm POC > referred for POC  - HCO3  06/15/19 = 38  -  04/04/2020   Walked 3lpm  pulsed  3laps @ approx 23ft each @avg  pace  stopped due desats p second lap to 87% corrected on 4lpm pulsed   With sats 98% at end  - HCO3  02/13/21  = 41  - 04/03/2021   Walked on RA  desat to 84% p 250 ft   Then 250 ft on 2lpm cont avg pace sats 92% at lowest - 06/27/2021 Patient Saturations on Room Air at Rest =95%  while Ambulating = 88% and  on 3 Liters of oxygen while Ambulating = 95%  - HC03  7/210/23  = 26   As of 07/04/2023  02: 2lpm conc hs and resting/  3lpm POC for ambulation / advised of danger of smoking and 02    Each maintenance medication was reviewed in detail including emphasizing most importantly the difference between maintenance and prns and under what circumstances the prns are to be triggered using an action plan format where appropriate.  Total time for H and P, chart review, counseling, reviewing hfa/neb/02 /pulse ox/ neb  device(s) and generating customized AVS unique to this office visit / same day charting = 20 min

## 2023-07-09 DIAGNOSIS — I5042 Chronic combined systolic (congestive) and diastolic (congestive) heart failure: Secondary | ICD-10-CM | POA: Diagnosis not present

## 2023-07-09 DIAGNOSIS — K651 Peritoneal abscess: Secondary | ICD-10-CM | POA: Diagnosis not present

## 2023-07-09 DIAGNOSIS — I110 Hypertensive heart disease with heart failure: Secondary | ICD-10-CM | POA: Diagnosis not present

## 2023-07-09 DIAGNOSIS — A419 Sepsis, unspecified organism: Secondary | ICD-10-CM | POA: Diagnosis not present

## 2023-07-09 DIAGNOSIS — I480 Paroxysmal atrial fibrillation: Secondary | ICD-10-CM | POA: Diagnosis not present

## 2023-07-09 DIAGNOSIS — I4892 Unspecified atrial flutter: Secondary | ICD-10-CM | POA: Diagnosis not present

## 2023-07-09 DIAGNOSIS — T8141XD Infection following a procedure, superficial incisional surgical site, subsequent encounter: Secondary | ICD-10-CM | POA: Diagnosis not present

## 2023-07-09 DIAGNOSIS — J449 Chronic obstructive pulmonary disease, unspecified: Secondary | ICD-10-CM | POA: Diagnosis not present

## 2023-07-09 DIAGNOSIS — J9621 Acute and chronic respiratory failure with hypoxia: Secondary | ICD-10-CM | POA: Diagnosis not present

## 2023-07-12 ENCOUNTER — Other Ambulatory Visit: Payer: Self-pay

## 2023-07-12 ENCOUNTER — Other Ambulatory Visit: Payer: Self-pay | Admitting: Family Medicine

## 2023-07-15 ENCOUNTER — Encounter: Payer: Self-pay | Admitting: Nurse Practitioner

## 2023-07-15 ENCOUNTER — Inpatient Hospital Stay: Payer: Medicare HMO

## 2023-07-15 ENCOUNTER — Other Ambulatory Visit: Payer: Self-pay

## 2023-07-15 ENCOUNTER — Inpatient Hospital Stay: Payer: Medicare HMO | Attending: Nurse Practitioner | Admitting: Nurse Practitioner

## 2023-07-15 VITALS — BP 122/60 | HR 57 | Temp 98.0°F | Resp 19 | Wt 199.5 lb

## 2023-07-15 DIAGNOSIS — E785 Hyperlipidemia, unspecified: Secondary | ICD-10-CM | POA: Diagnosis not present

## 2023-07-15 DIAGNOSIS — Z7901 Long term (current) use of anticoagulants: Secondary | ICD-10-CM | POA: Insufficient documentation

## 2023-07-15 DIAGNOSIS — Z7982 Long term (current) use of aspirin: Secondary | ICD-10-CM | POA: Diagnosis not present

## 2023-07-15 DIAGNOSIS — Z9981 Dependence on supplemental oxygen: Secondary | ICD-10-CM | POA: Insufficient documentation

## 2023-07-15 DIAGNOSIS — K274 Chronic or unspecified peptic ulcer, site unspecified, with hemorrhage: Secondary | ICD-10-CM | POA: Diagnosis not present

## 2023-07-15 DIAGNOSIS — D500 Iron deficiency anemia secondary to blood loss (chronic): Secondary | ICD-10-CM | POA: Insufficient documentation

## 2023-07-15 DIAGNOSIS — J449 Chronic obstructive pulmonary disease, unspecified: Secondary | ICD-10-CM | POA: Insufficient documentation

## 2023-07-15 DIAGNOSIS — I4891 Unspecified atrial fibrillation: Secondary | ICD-10-CM | POA: Insufficient documentation

## 2023-07-15 LAB — CBC WITH DIFFERENTIAL (CANCER CENTER ONLY)
Abs Immature Granulocytes: 0.02 10*3/uL (ref 0.00–0.07)
Basophils Absolute: 0 10*3/uL (ref 0.0–0.1)
Basophils Relative: 0 %
Eosinophils Absolute: 0.1 10*3/uL (ref 0.0–0.5)
Eosinophils Relative: 1 %
HCT: 34.3 % — ABNORMAL LOW (ref 39.0–52.0)
Hemoglobin: 9.9 g/dL — ABNORMAL LOW (ref 13.0–17.0)
Immature Granulocytes: 0 %
Lymphocytes Relative: 37 %
Lymphs Abs: 3.3 10*3/uL (ref 0.7–4.0)
MCH: 26.8 pg (ref 26.0–34.0)
MCHC: 28.9 g/dL — ABNORMAL LOW (ref 30.0–36.0)
MCV: 93 fL (ref 80.0–100.0)
Monocytes Absolute: 0.8 10*3/uL (ref 0.1–1.0)
Monocytes Relative: 9 %
Neutro Abs: 4.7 10*3/uL (ref 1.7–7.7)
Neutrophils Relative %: 53 %
Platelet Count: 314 10*3/uL (ref 150–400)
RBC: 3.69 MIL/uL — ABNORMAL LOW (ref 4.22–5.81)
RDW: 14.9 % (ref 11.5–15.5)
WBC Count: 8.9 10*3/uL (ref 4.0–10.5)
nRBC: 0 % (ref 0.0–0.2)

## 2023-07-15 LAB — IRON AND IRON BINDING CAPACITY (CC-WL,HP ONLY)
Iron: 48 ug/dL (ref 45–182)
Saturation Ratios: 13 % — ABNORMAL LOW (ref 17.9–39.5)
TIBC: 375 ug/dL (ref 250–450)
UIBC: 327 ug/dL (ref 117–376)

## 2023-07-15 LAB — FERRITIN: Ferritin: 7 ng/mL — ABNORMAL LOW (ref 24–336)

## 2023-07-15 NOTE — Progress Notes (Signed)
Patient Care Team: [name], MD as PCP - General (Family Medicine) Pricilla Riffle, MD as PCP - Cardiology (Cardiology) [facility] Pollyann Samples, NP as Nurse Practitioner (Hematology and Oncology) Alvan Dame, DPM as Consulting Physician (Podiatry)   CHIEF COMPLAINT: Follow-up IDA  CURRENT THERAPY: Oral iron BID, IV iron as needed if ferritin <30   INTERVAL HISTORY [name] returns for follow-up as scheduled, last seen by me as a new patient 01/28/23.  He received additional IV iron in 9/24, 10/24, and 11/24.  IV iron helped his energy level.  Hospitalized in December for sepsis secondary to acute appendicitis with perforation s/p lap appendectomy 05/01/23 with development of residual deep pelvic abscess requiring pelvic drains which were removed 12/10.  Due to worsening anemia in the hospital, small bowel endoscopy was performed which showed a single nonbleeding AVM, treated with APC.  He was discharged to SNF and is home now with PT once a week.  Did not receive IV iron in January.  He is gradually recovering.  Has mild incisional pain, denies bleeding or recurrent fevers/chills.  Bowels moving okay with stool softener on oral iron twice daily.  Respiratory function is the same, on 2-3 L O2.   ROS  All other systems reviewed and negative  Past Medical History:  Diagnosis Date   AVM (arteriovenous malformation)    CAD (coronary artery disease)    a. LHC 5/12:  LAD 20, pLCx 20, pRCA 40, dRCA 40, EF 35%, diff HK  //  b. Myoview 4/16: Overall Impression:  High risk stress nuclear study There is no evidence of ischemia.  There is severe LV dysfunction. LV Ejection Fraction: 30%.  LV Wall Motion:  There is global LV hypokinesis.     CAP (community acquired pneumonia) 09/2013   Chronic combined systolic and diastolic CHF (congestive heart failure) (HCC)    a. Echo 4/16:Mild LVH, EF 40-45%, diffuse HK //  b. Echo 8/17: EF 35-40%, diffuse HK, diastolic  dysfunction, aortic sclerosis, trivial MR, moderate LAE, normal RVSF, moderate RAE, mild TR, PASP 42 mmHg // c. Echo 4/18: Mild concentric LVH, EF 30-35, normal wall motion, grade 1 diastolic dysfunction, PASP 49   Chronic respiratory failure (HCC)    Cluster headache    "hx; haven't had one in awhile" (01/09/2016)   COPD (chronic obstructive pulmonary disease) (HCC)    [name] 01/09/2016   DM (diabetes mellitus) (HCC)    History of CVA (cerebrovascular accident)    Hypertension    IDA (iron deficiency anemia)    Moderate tobacco use disorder    NICM (nonischemic cardiomyopathy) (HCC)    Nicotine addiction    Tobacco abuse    Type 2 diabetes mellitus (HCC) 05/14/2016     Past Surgical History:  Procedure Laterality Date   ABDOMINAL AORTOGRAM W/LOWER EXTREMITY N/A 12/07/2022   Procedure: ABDOMINAL AORTOGRAM W/LOWER EXTREMITY;  Surgeon: Leonie Douglas, MD;  [facility]: [facility];  Service: Cardiovascular;  Laterality: N/A;   BIOPSY  11/12/2020   Procedure: BIOPSY;  Surgeon: Lemar Lofty., MD;  Location: [facility] ENDOSCOPY;  Service: Gastroenterology;;   CARDIAC CATHETERIZATION  10/2010   LM normal, LAD with 20% irregularities, LCX with 20%, RCA with 40% prox and 40% distal - EF of 35%   CATARACT EXTRACTION, BILATERAL     COLONOSCOPY W/ BIOPSIES AND POLYPECTOMY     COLONOSCOPY WITH PROPOFOL N/A 09/06/2018   Procedure: COLONOSCOPY WITH PROPOFOL;  Surgeon: Tressia Danas, MD;  Location:  [facility]OS[facility];  Service: Gastroenterology;  Laterality: N/A;   ENTEROSCOPY N/A 09/28/2018   Procedure: ENTEROSCOPY;  Surgeon: Jeani Hawking, MD;  Location: [facility] ENDOSCOPY;  Service: Endoscopy;  Laterality: N/A;   ENTEROSCOPY N/A 10/28/2018   Procedure: ENTEROSCOPY;  Surgeon: Tressia Danas, MD;  Location: [facility] ENDOSCOPY;  Service: Gastroenterology;  Laterality: N/A;   ENTEROSCOPY N/A 10/09/2020   Procedure: ENTEROSCOPY;  Surgeon: Hilarie Fredrickson, MD;  Location: [facility] ENDOSCOPY;  Service: Endoscopy;   Laterality: N/A;   ENTEROSCOPY N/A 03/22/2022   Procedure: ENTEROSCOPY;  Surgeon: Benancio Deeds, MD;  Location: [facility] ENDOSCOPY;  Service: Gastroenterology;  Laterality: N/A;   ENTEROSCOPY N/A 05/20/2023   Procedure: ENTEROSCOPY;  Surgeon: Sherrilyn Rist, MD;  Location: [facility] ENDOSCOPY;  Service: Gastroenterology;  Laterality: N/A;   ESOPHAGOGASTRODUODENOSCOPY N/A 11/12/2020   Procedure: ESOPHAGOGASTRODUODENOSCOPY (EGD);  Surgeon: Lemar Lofty., MD;  Location: [facility] ENDOSCOPY;  Service: Gastroenterology;  Laterality: N/A;   ESOPHAGOGASTRODUODENOSCOPY (EGD) WITH PROPOFOL N/A 09/05/2018   Procedure: ESOPHAGOGASTRODUODENOSCOPY (EGD) WITH PROPOFOL;  Surgeon: Benancio Deeds, MD;  Location: [facility] ENDOSCOPY;  Service: Gastroenterology;  Laterality: N/A;   ESOPHAGOGASTRODUODENOSCOPY (EGD) WITH PROPOFOL N/A 11/19/2020   Procedure: ESOPHAGOGASTRODUODENOSCOPY (EGD) WITH PROPOFOL;  Surgeon: Beverley Fiedler, MD;  Location: [facility] ENDOSCOPY;  Service: Gastroenterology;  Laterality: N/A;   ESOPHAGOGASTRODUODENOSCOPY (EGD) WITH PROPOFOL N/A 12/27/2022   Procedure: ESOPHAGOGASTRODUODENOSCOPY (EGD) WITH PROPOFOL;  Surgeon: Napoleon Form, MD;  Location: [facility] ENDOSCOPY;  Service: Gastroenterology;  Laterality: N/A;   EXCISION MASS HEAD     GIVENS CAPSULE STUDY N/A 09/06/2018   Procedure: GIVENS CAPSULE STUDY;  Surgeon: Tressia Danas, MD;  Location: [facility] ENDOSCOPY;  Service: Gastroenterology;  Laterality: N/A;   GIVENS CAPSULE STUDY N/A 09/26/2018   Procedure: GIVENS CAPSULE STUDY;  Surgeon: Beverley Fiedler, MD;  Location: [facility] ENDOSCOPY;  Service: Gastroenterology;  Laterality: N/A;   GIVENS CAPSULE STUDY N/A 06/14/2019   Procedure: GIVENS CAPSULE STUDY;  Surgeon: Napoleon Form, MD;  Location: [facility]OSCOPY;  Service: Endoscopy;  Laterality: N/A;   HEMOSTASIS CLIP PLACEMENT  11/12/2020   Procedure: HEMOSTASIS CLIP PLACEMENT;  Surgeon: Lemar Lofty., MD;  Location: [facility] ENDOSCOPY;  Service:  Gastroenterology;;   HEMOSTASIS CONTROL  11/12/2020   Procedure: HEMOSTASIS CONTROL;  Surgeon: Lemar Lofty., MD;  Location: [facility] ENDOSCOPY;  Service: Gastroenterology;;   HOT HEMOSTASIS N/A 10/28/2018   Procedure: HOT HEMOSTASIS (ARGON PLASMA COAGULATION/BICAP);  Surgeon: Tressia Danas, MD;  Location: [facility] ENDOSCOPY;  Service: Gastroenterology;  Laterality: N/A;   HOT HEMOSTASIS N/A 11/12/2020   Procedure: HOT HEMOSTASIS (ARGON PLASMA COAGULATION/BICAP);  Surgeon: Lemar Lofty., MD;  Location: [facility] ENDOSCOPY;  Service: Gastroenterology;  Laterality: N/A;   HOT HEMOSTASIS N/A 03/22/2022   Procedure: HOT HEMOSTASIS (ARGON PLASMA COAGULATION/BICAP);  Surgeon: Benancio Deeds, MD;  Location: [facility] ENDOSCOPY;  Service: Gastroenterology;  Laterality: N/A;   HOT HEMOSTASIS N/A 05/20/2023   Procedure: HOT HEMOSTASIS (ARGON PLASMA COAGULATION/BICAP);  Surgeon: Sherrilyn Rist, MD;  Location: [facility] ENDOSCOPY;  Service: Gastroenterology;  Laterality: N/A;   INCISION AND DRAINAGE PERIRECTAL ABSCESS N/A 06/05/2017   Procedure: IRRIGATION AND DEBRIDEMENT PERIRECTAL ABSCESS;  Surgeon: Andria Meuse, MD;  Location: [facility] OR;  Service: General;  Laterality: N/A;   IR CATHETER TUBE CHANGE  05/14/2023   LAPAROSCOPIC APPENDECTOMY N/A 05/01/2023   Procedure: APPENDECTOMY LAPAROSCOPIC;  Surgeon: Emelia Loron, MD;  Location: [facility] OR;  Service: General;  Laterality: N/A;   PERIPHERAL VASCULAR INTERVENTION  12/07/2022   Procedure: PERIPHERAL VASCULAR INTERVENTION;  Surgeon: Heath Lark  N, MD;  [facility]: [facility];  Service: Cardiovascular;;   SUBMUCOSAL TATTOO INJECTION  11/12/2020   Procedure: SUBMUCOSAL TATTOO INJECTION;  Surgeon: Lemar Lofty., MD;  Location: [facility] ENDOSCOPY;  Service: Gastroenterology;;   SUBMUCOSAL TATTOO INJECTION  05/20/2023   Procedure: SUBMUCOSAL TATTOO INJECTION;  Surgeon: Sherrilyn Rist, MD;  Location: [facility] ENDOSCOPY;  Service: Gastroenterology;;    VIDEO BRONCHOSCOPY Bilateral 05/08/2016   Procedure: VIDEO BRONCHOSCOPY WITH FLUORO;  Surgeon: Oretha Milch, MD;  Location: [facility] ENDOSCOPY;  Service: Cardiopulmonary;  Laterality: Bilateral;     Outpatient Encounter Medications as of 07/15/2023  Medication Sig   Accu-Chek Softclix Lancets lancets Use to check blood sugar once daily.   acetaminophen (TYLENOL) 500 MG tablet Take 2 tablets (1,000 mg total) by mouth every 8 (eight) hours as needed for moderate pain.   albuterol (PROVENTIL) (2.5 MG/3ML) 0.083% nebulizer solution Take 3 mLs (2.5 mg total) by nebulization every 2 (two) hours as needed for wheezing or shortness of breath.   albuterol (VENTOLIN HFA) 108 (90 Base) MCG/ACT inhaler INHALE 2 PUFFS INTO THE LUNGS EVERY 6 (SIX) HOURS AS NEEDED FOR WHEEZING OR SHORTNESS OF BREATH.   atorvastatin (LIPITOR) 80 MG tablet Take 1 tablet (80 mg total) by mouth daily.   Blood Glucose Monitoring Suppl (ACCU-CHEK GUIDE) w/Device KIT Use to check blood sugar once daily.   Budeson-Glycopyrrol-Formoterol (BREZTRI AEROSPHERE) 160-9-4.8 MCG/ACT AERO Take 2 puffs first thing in morning and then another 2 puffs about 12 hours later.   DULoxetine (CYMBALTA) 60 MG capsule Take 1 capsule (60 mg total) by mouth daily. For chronic leg pains   ergocalciferol (DRISDOL) 1.25 MG (50000 UT) capsule Take 1 capsule (50,000 Units total) by mouth once a week.   ezetimibe (ZETIA) 10 MG tablet Take 1 tablet (10 mg total) by mouth daily.   feeding supplement (ENSURE ENLIVE / ENSURE PLUS) LIQD Take 237 mLs by mouth 2 (two) times daily between meals.   ferrous sulfate (FEROSUL) 325 (65 FE) MG tablet Take 1 tablet (325 mg total) by mouth 2 (two) times daily with a meal.   folic acid (FOLVITE) 1 MG tablet Take 1 tablet (1 mg total) by mouth daily.   furosemide (LASIX) 40 MG tablet Take 1 tablet (40 mg total) by mouth daily.   gabapentin (NEURONTIN) 300 MG capsule Take 1 capsule (300 mg total) by mouth 2 (two) times daily.   glucose  blood (ACCU-CHEK GUIDE) test strip Use to check blood sugar once daily.   hydrALAZINE (APRESOLINE) 50 MG tablet Take 1 tablet (50 mg total) by mouth every 8 (eight) hours.   isosorbide mononitrate (IMDUR) 30 MG 24 hr tablet Take 1 tablet (30 mg total) by mouth daily.   metoprolol succinate (TOPROL-XL) 50 MG 24 hr tablet Take 1.5 tablets (75 mg total) by mouth daily. Take with or immediately following a meal.   Multiple Vitamin (MULTIVITAMIN WITH MINERALS) TABS tablet Take 1 tablet by mouth daily.   OXYGEN Inhale 2 L/min into the lungs continuous.   pantoprazole (PROTONIX) 40 MG tablet Take 1 tablet (40 mg total) by mouth daily.   polyethylene glycol (MIRALAX / GLYCOLAX) 17 g packet Take 17 g by mouth daily.   rivaroxaban (XARELTO) 20 MG TABS tablet Take 1 tablet (20 mg total) by mouth daily with supper.   nitroGLYCERIN (NITROSTAT) 0.4 MG SL tablet Place 1 tablet (0.4 mg total) under the tongue every 5 (five) minutes as needed for chest pain.   [DISCONTINUED] oxyCODONE (OXY IR/ROXICODONE)  5 MG immediate release tablet Take 1-2 tablets (5-10 mg total) by mouth every 6 (six) hours as needed for moderate pain (pain score 4-6) or severe pain (pain score 7-10) (5 mg for 4-6/10 pain. 10 mg for >6/10 pain).   [DISCONTINUED] TUDORZA PRESSAIR 400 MCG/ACT AEPB INHALE 1 PUFF INTO THE LUNGS IN THE MORNING AND AT BEDTIME.   No facility-administered encounter medications on file as of 07/15/2023.     Today's Vitals   07/15/23 1016 07/15/23 1023 07/15/23 1025  BP: (!) 130/118  122/60  Pulse: (!) 57    Resp: 19    Temp: 98 F (36.7 C)    TempSrc: Temporal    SpO2: 96%    Weight: 199 lb 8 oz (90.5 kg)    PainSc:  0-No pain    Body mass index is 33.2 kg/m.   PHYSICAL EXAM GENERAL:alert, no distress and comfortable SKIN: no rash  EYES: sclera clear LUNGS: decreased throughout, with normal breathing effort on supplemental oxygen HEART: regular rate & rhythm ABDOMEN: abdomen soft, non-tender and normal  bowel sounds. Incisions healed NEURO: alert & oriented x 3 with fluent speech, no focal motor/sensory deficits   CBC    Component Value Date/Time   WBC 8.9 07/15/2023 0955   WBC 8.3 05/21/2023 0434   RBC 3.69 (L) 07/15/2023 0955   HGB 9.9 (L) 07/15/2023 0955   HGB 10.2 (L) 06/10/2023 1012   HCT 34.3 (L) 07/15/2023 0955   HCT 33.9 (L) 06/10/2023 1012   PLT 314 07/15/2023 0955   PLT 332 06/10/2023 1012   MCV 93.0 07/15/2023 0955   MCV 87 06/10/2023 1012   MCH 26.8 07/15/2023 0955   MCHC 28.9 (L) 07/15/2023 0955   RDW 14.9 07/15/2023 0955   RDW 14.3 06/10/2023 1012   LYMPHSABS 3.3 07/15/2023 0955   LYMPHSABS 2.7 06/10/2023 1012   MONOABS 0.8 07/15/2023 0955   EOSABS 0.1 07/15/2023 0955   EOSABS 0.1 06/10/2023 1012   BASOSABS 0.0 07/15/2023 0955   BASOSABS 0.1 06/10/2023 1012     CMP     Component Value Date/Time   NA 142 06/10/2023 1012   K 3.4 (L) 06/10/2023 1012   CL 100 06/10/2023 1012   CO2 25 06/10/2023 1012   GLUCOSE 107 (H) 06/10/2023 1012   GLUCOSE 156 (H) 05/20/2023 0519   BUN 13 06/10/2023 1012   CREATININE 0.89 06/10/2023 1012   CREATININE 1.20 07/30/2016 1431   CALCIUM 9.4 06/10/2023 1012   PROT 6.1 (L) 05/17/2023 0343   PROT 6.7 10/25/2022 1027   ALBUMIN 2.6 (L) 05/20/2023 0519   ALBUMIN 4.4 10/25/2022 1027   AST 69 (H) 05/17/2023 0343   ALT 74 (H) 05/17/2023 0343   ALKPHOS 151 (H) 05/17/2023 0343   BILITOT 0.4 05/17/2023 0343   BILITOT 0.3 10/25/2022 1027   GFRNONAA >60 05/20/2023 0519   GFRNONAA 64 07/30/2016 1431   GFRAA 91 01/29/2020 1020   GFRAA 74 07/30/2016 1431     ASSESSMENT & PLAN: 68 yo male    Acute on chronic anemia, iron deficiency, secondary to h/o GI bleed, PUD, and inadequate iron replacement -First found to have iron deficiency 09/05/2018, s/p blood transfusion  -EGD showed esophagitis, colonoscopy and small bowel endoscopy were unremarkable. Anemia improved -Developed acute on chronic anemia 10/2018, small bowel endoscopy   -Began receiving IV iron at that time  -Capsule endoscopy 06/18/19 showed gastritis, no AVMs or bleeding.  -He saw Dr. Clelia Croft 07/21/19 and 09/17/19 with resolution of IDA, he stopped oral  iron  -Recurrent IDA in 4/22, repeat small bowel endoscopy 10/09/20 was normal, but procedure in 11/12/20 by Dr. Meridee Score showed gastritis and 2 gastric ulcers.  -It was felt IDA to be secondary to intermittent mucosal oozing in a patient on chronic anticoagulation therapy and also inadequate iron replacement. We recommend indefinite iron replacement with oral iron 1-2 times daily and IV iron PRN -given the intermittent nature of his anemia, this is likely not primary bone marrow condition. Although he understands if anemia does not resolve with sufficient iron replacement, we may recommend additional work up. He also likely has component of anemia of chronic disease - IV iron PRN (goal Ferritin >30), given monthly 12/2022 - 04/2023.  Anemia improved, did not require blood transfusion after starting IV iron -Mr. Labriola appears stable.  Anemia worsened during acute appendicitis and subsequent sepsis at the end of 2024, s/p RBC transfusion 12/5 and 12/9.   -Small bowel endoscopy showed a single nonbleeding AVM which was treated with APC, acknowledging that there could be other bleeding AVMs not detected on the endoscopy. He has resumed xarelto. -He is recovering well but gradually, doing PT.  Denies any bleeding or signs of recurrent infection.  He continues oral iron twice daily which he is tolerating well -Labs reviewed, Hgb 9.9, we will follow-up the pending iron studies and arrange IV iron -Continue lab q2 months, with additional IV iron if ferritin < 30 -Follow-up in 6 months, or sooner if needed  Afib, PVD, HL, DM, COPD -On home O2 2-3 L -S/p left femoropopliteal angioplasty and stenting (6x112mm Eluvia x4; 6x44mm Eluvia) on 12/07/22 for ischemic rest pain. Followed by Dr. Lenell Antu -On Xarelto and ASA 81 mg, we reviewed  this increases bleeding risk and will need close monitoring     PLAN: [facility] course, endoscopy, imaging, and labs reviewed -Continue oral iron BID -IV Venofer if today's ferritin < 30 -Lab q2 months, additional IV iron PRN -F/up in 6 months, or sooner if needed    All questions were answered. The patient knows to call the clinic with any problems, questions or concerns. No barriers to learning were detected. I spent 20 minutes counseling the patient face to face. The total time spent in the appointment was 30 minutes and more than 50% was on counseling, review of test results, and coordination of care.   Santiago Glad, NP-C 07/15/2023

## 2023-07-17 ENCOUNTER — Telehealth: Payer: Self-pay

## 2023-07-17 ENCOUNTER — Other Ambulatory Visit: Payer: Self-pay | Admitting: Nurse Practitioner

## 2023-07-17 DIAGNOSIS — I5042 Chronic combined systolic (congestive) and diastolic (congestive) heart failure: Secondary | ICD-10-CM | POA: Diagnosis not present

## 2023-07-17 DIAGNOSIS — J9621 Acute and chronic respiratory failure with hypoxia: Secondary | ICD-10-CM | POA: Diagnosis not present

## 2023-07-17 DIAGNOSIS — T8141XD Infection following a procedure, superficial incisional surgical site, subsequent encounter: Secondary | ICD-10-CM | POA: Diagnosis not present

## 2023-07-17 DIAGNOSIS — I110 Hypertensive heart disease with heart failure: Secondary | ICD-10-CM | POA: Diagnosis not present

## 2023-07-17 DIAGNOSIS — I480 Paroxysmal atrial fibrillation: Secondary | ICD-10-CM | POA: Diagnosis not present

## 2023-07-17 DIAGNOSIS — J449 Chronic obstructive pulmonary disease, unspecified: Secondary | ICD-10-CM | POA: Diagnosis not present

## 2023-07-17 DIAGNOSIS — I4892 Unspecified atrial flutter: Secondary | ICD-10-CM | POA: Diagnosis not present

## 2023-07-17 DIAGNOSIS — A419 Sepsis, unspecified organism: Secondary | ICD-10-CM | POA: Diagnosis not present

## 2023-07-17 DIAGNOSIS — K651 Peritoneal abscess: Secondary | ICD-10-CM | POA: Diagnosis not present

## 2023-07-17 NOTE — Telephone Encounter (Signed)
 Triage Call: -received VM from pt stating his leg is worse that it was before. -on chart review, pt had L fem-pop angioplasty/stenting June 2024. -returned call back to pt who states after surgery, his leg was better and not having any pain but about 1 month ago, his leg started hurting when he is walking like before and has to stop.  Laying down or resting does make it feel a little better but it does not go away.  He denies any trauma or injuries to his leg, and states it is still warm to the touch. -booked appt

## 2023-07-17 NOTE — Telephone Encounter (Signed)
 Jared [name], patient will be scheduled as soon as possible.  Auth Submission: NO AUTH NEEDED Site of care: Site of care: [facility] Payer: [employer] and [employer] Medication & CPT/J Code(s) submitted: Venofer  (Iron  Sucrose) J1756 Route of submission (phone, fax, portal):  Phone # Fax # Auth type: Buy/Bill PB Units/visits requested: 200mg  x 5 doses Reference number:  Approval from: 07/17/23 to 12/14/23

## 2023-07-18 ENCOUNTER — Encounter: Payer: Self-pay | Admitting: Nurse Practitioner

## 2023-07-18 ENCOUNTER — Other Ambulatory Visit: Payer: Self-pay | Admitting: *Deleted

## 2023-07-18 DIAGNOSIS — I739 Peripheral vascular disease, unspecified: Secondary | ICD-10-CM

## 2023-07-18 DIAGNOSIS — I70229 Atherosclerosis of native arteries of extremities with rest pain, unspecified extremity: Secondary | ICD-10-CM

## 2023-07-19 ENCOUNTER — Ambulatory Visit (INDEPENDENT_AMBULATORY_CARE_PROVIDER_SITE_OTHER)
Admission: RE | Admit: 2023-07-19 | Discharge: 2023-07-19 | Disposition: A | Discharge status: Home / Self Care | Payer: Medicare HMO | Source: Ambulatory Visit | Attending: Vascular Surgery | Admitting: Vascular Surgery

## 2023-07-19 ENCOUNTER — Ambulatory Visit (HOSPITAL_COMMUNITY)
Admission: RE | Admit: 2023-07-19 | Discharge: 2023-07-19 | Disposition: A | Discharge status: Home / Self Care | Payer: Medicare HMO | Source: Ambulatory Visit | Attending: Vascular Surgery | Admitting: Vascular Surgery

## 2023-07-19 ENCOUNTER — Ambulatory Visit (INDEPENDENT_AMBULATORY_CARE_PROVIDER_SITE_OTHER): Payer: Medicare HMO | Admitting: Physician Assistant

## 2023-07-19 VITALS — BP 132/74 | HR 115 | Temp 98.3°F | Resp 22 | Ht 65.0 in | Wt 203.2 lb

## 2023-07-19 DIAGNOSIS — I739 Peripheral vascular disease, unspecified: Secondary | ICD-10-CM | POA: Insufficient documentation

## 2023-07-19 DIAGNOSIS — F17200 Nicotine dependence, unspecified, uncomplicated: Secondary | ICD-10-CM

## 2023-07-19 DIAGNOSIS — I70222 Atherosclerosis of native arteries of extremities with rest pain, left leg: Secondary | ICD-10-CM | POA: Diagnosis not present

## 2023-07-19 DIAGNOSIS — I70229 Atherosclerosis of native arteries of extremities with rest pain, unspecified extremity: Secondary | ICD-10-CM | POA: Diagnosis not present

## 2023-07-19 LAB — VAS US ABI WITH/WO TBI
Left ABI: 0.24
Right ABI: 0.76

## 2023-07-19 NOTE — Progress Notes (Signed)
 HISTORY AND PHYSICAL     CC:  follow up. Requesting Provider:  Delbert Clam, MD  HPI: This is a 68 y.o. male who is here today for follow up for PAD.  Pt has hx of angiogram with left femoropopliteal angioplasty and stenting 12/07/2022 by Dr. Magda for rest pain.  Pt was last seen 01/15/2023 and at that time, he was doing well.    The pt returns today for follow up.  He states that he got out of the hospital after Christmas for a ruptured appendix.  He states that his leg started hurting about the 1st week of January.  He states that it has been gradual in onset.  He now walks a very short distance and develops claudication in the left leg.  He does not have any wounds on his feet.  He is on asa and statin as well as Xarelto .   Pt is on continuous home O2.  He continues to smoke.  He was in the [employer].    The pt is on a statin for cholesterol management.    The pt is not on an aspirin .    Other AC:  Xarelto  The pt is on BB, diuretic, hydralazine  for hypertension.  The pt is diabetic Tobacco hx:  current  Pt does not have family hx of AAA.  Past Medical History:  Diagnosis Date   AVM (arteriovenous malformation)    CAD (coronary artery disease)    a. LHC 5/12:  LAD 20, pLCx 20, pRCA 40, dRCA 40, EF 35%, diff HK  //  b. Myoview 4/16: Overall Impression:  High risk stress nuclear study There is no evidence of ischemia.  There is severe LV dysfunction. LV Ejection Fraction: 30%.  LV Wall Motion:  There is global LV hypokinesis.     CAP (community acquired pneumonia) 09/2013   Chronic combined systolic and diastolic CHF (congestive heart failure) (HCC)    a. Echo 4/16:Mild LVH, EF 40-45%, diffuse HK //  b. Echo 8/17: EF 35-40%, diffuse HK, diastolic dysfunction, aortic sclerosis, trivial MR, moderate LAE, normal RVSF, moderate RAE, mild TR, PASP 42 mmHg // c. Echo 4/18: Mild concentric LVH, EF 30-35, normal wall motion, grade 1 diastolic dysfunction, PASP 49   Chronic respiratory  failure (HCC)    Cluster headache    hx; haven't had one in awhile (01/09/2016)   COPD (chronic obstructive pulmonary disease) (HCC)    thelbert 01/09/2016   DM (diabetes mellitus) (HCC)    History of CVA (cerebrovascular accident)    Hypertension    IDA (iron  deficiency anemia)    Moderate tobacco use disorder    NICM (nonischemic cardiomyopathy) (HCC)    Nicotine  addiction    Tobacco abuse    Type 2 diabetes mellitus (HCC) 05/14/2016    Past Surgical History:  Procedure Laterality Date   ABDOMINAL AORTOGRAM W/LOWER EXTREMITY N/A 12/07/2022   Procedure: ABDOMINAL AORTOGRAM W/LOWER EXTREMITY;  Surgeon: Magda Debby SAILOR, MD;  Location: [facility] INVASIVE CV LAB;  Service: Cardiovascular;  Laterality: N/A;   BIOPSY  11/12/2020   Procedure: BIOPSY;  Surgeon: Wilhelmenia Aloha Raddle., MD;  Location: [facility] ENDOSCOPY;  Service: Gastroenterology;;   CARDIAC CATHETERIZATION  10/2010   LM normal, LAD with 20% irregularities, LCX with 20%, RCA with 40% prox and 40% distal - EF of 35%   CATARACT EXTRACTION, BILATERAL     COLONOSCOPY W/ BIOPSIES AND POLYPECTOMY     COLONOSCOPY WITH PROPOFOL  N/A 09/06/2018   Procedure: COLONOSCOPY WITH PROPOFOL ;  Surgeon: Eda,  Cala Bradford, MD;  Location: [facility] ENDOSCOPY;  Service: Gastroenterology;  Laterality: N/A;   ENTEROSCOPY N/A 09/28/2018   Procedure: ENTEROSCOPY;  Surgeon: Jeani Hawking, MD;  Location: [facility] ENDOSCOPY;  Service: Endoscopy;  Laterality: N/A;   ENTEROSCOPY N/A 10/28/2018   Procedure: ENTEROSCOPY;  Surgeon: Tressia Danas, MD;  Location: [facility] ENDOSCOPY;  Service: Gastroenterology;  Laterality: N/A;   ENTEROSCOPY N/A 10/09/2020   Procedure: ENTEROSCOPY;  Surgeon: Hilarie Fredrickson, MD;  Location: [facility] ENDOSCOPY;  Service: Endoscopy;  Laterality: N/A;   ENTEROSCOPY N/A 03/22/2022   Procedure: ENTEROSCOPY;  Surgeon: Benancio Deeds, MD;  Location: [facility] ENDOSCOPY;  Service: Gastroenterology;  Laterality: N/A;   ENTEROSCOPY N/A 05/20/2023   Procedure: ENTEROSCOPY;   Surgeon: Sherrilyn Rist, MD;  Location: [facility] ENDOSCOPY;  Service: Gastroenterology;  Laterality: N/A;   ESOPHAGOGASTRODUODENOSCOPY N/A 11/12/2020   Procedure: ESOPHAGOGASTRODUODENOSCOPY (EGD);  Surgeon: Lemar Lofty., MD;  Location: [facility] ENDOSCOPY;  Service: Gastroenterology;  Laterality: N/A;   ESOPHAGOGASTRODUODENOSCOPY (EGD) WITH PROPOFOL N/A 09/05/2018   Procedure: ESOPHAGOGASTRODUODENOSCOPY (EGD) WITH PROPOFOL;  Surgeon: Benancio Deeds, MD;  Location: [facility] ENDOSCOPY;  Service: Gastroenterology;  Laterality: N/A;   ESOPHAGOGASTRODUODENOSCOPY (EGD) WITH PROPOFOL N/A 11/19/2020   Procedure: ESOPHAGOGASTRODUODENOSCOPY (EGD) WITH PROPOFOL;  Surgeon: Beverley Fiedler, MD;  Location: [facility] ENDOSCOPY;  Service: Gastroenterology;  Laterality: N/A;   ESOPHAGOGASTRODUODENOSCOPY (EGD) WITH PROPOFOL N/A 12/27/2022   Procedure: ESOPHAGOGASTRODUODENOSCOPY (EGD) WITH PROPOFOL;  Surgeon: Napoleon Form, MD;  Location: [facility] ENDOSCOPY;  Service: Gastroenterology;  Laterality: N/A;   EXCISION MASS HEAD     GIVENS CAPSULE STUDY N/A 09/06/2018   Procedure: GIVENS CAPSULE STUDY;  Surgeon: Tressia Danas, MD;  Location: [facility]OSCOPY;  Service: Gastroenterology;  Laterality: N/A;   GIVENS CAPSULE STUDY N/A 09/26/2018   Procedure: GIVENS CAPSULE STUDY;  Surgeon: Beverley Fiedler, MD;  Location: [facility] ENDOSCOPY;  Service: Gastroenterology;  Laterality: N/A;   GIVENS CAPSULE STUDY N/A 06/14/2019   Procedure: GIVENS CAPSULE STUDY;  Surgeon: Napoleon Form, MD;  Location: [facility] ENDOSCOPY;  Service: Endoscopy;  Laterality: N/A;   HEMOSTASIS CLIP PLACEMENT  11/12/2020   Procedure: HEMOSTASIS CLIP PLACEMENT;  Surgeon: Lemar Lofty., MD;  Location: [facility] ENDOSCOPY;  Service: Gastroenterology;;   HEMOSTASIS CONTROL  11/12/2020   Procedure: HEMOSTASIS CONTROL;  Surgeon: Lemar Lofty., MD;  Location: [facility] ENDOSCOPY;  Service: Gastroenterology;;   HOT HEMOSTASIS N/A 10/28/2018   Procedure: HOT HEMOSTASIS (ARGON  PLASMA COAGULATION/BICAP);  Surgeon: Tressia Danas, MD;  Location: [facility] ENDOSCOPY;  Service: Gastroenterology;  Laterality: N/A;   HOT HEMOSTASIS N/A 11/12/2020   Procedure: HOT HEMOSTASIS (ARGON PLASMA COAGULATION/BICAP);  Surgeon: Lemar Lofty., MD;  Location: [facility] ENDOSCOPY;  Service: Gastroenterology;  Laterality: N/A;   HOT HEMOSTASIS N/A 03/22/2022   Procedure: HOT HEMOSTASIS (ARGON PLASMA COAGULATION/BICAP);  Surgeon: Benancio Deeds, MD;  Location: [facility] ENDOSCOPY;  Service: Gastroenterology;  Laterality: N/A;   HOT HEMOSTASIS N/A 05/20/2023   Procedure: HOT HEMOSTASIS (ARGON PLASMA COAGULATION/BICAP);  Surgeon: Sherrilyn Rist, MD;  Location: [facility] ENDOSCOPY;  Service: Gastroenterology;  Laterality: N/A;   INCISION AND DRAINAGE PERIRECTAL ABSCESS N/A 06/05/2017   Procedure: IRRIGATION AND DEBRIDEMENT PERIRECTAL ABSCESS;  Surgeon: Andria Meuse, MD;  Location: [facility] OR;  Service: General;  Laterality: N/A;   IR CATHETER TUBE CHANGE  05/14/2023   LAPAROSCOPIC APPENDECTOMY N/A 05/01/2023   Procedure: APPENDECTOMY LAPAROSCOPIC;  Surgeon: Emelia Loron, MD;  Location: [facility] OR;  Service: General;  Laterality: N/A;   PERIPHERAL VASCULAR INTERVENTION  12/07/2022   Procedure: PERIPHERAL VASCULAR INTERVENTION;  Surgeon: Magda Debby SAILOR, MD;  Location: [facility] INVASIVE CV LAB;  Service: Cardiovascular;;   SUBMUCOSAL TATTOO INJECTION  11/12/2020   Procedure: SUBMUCOSAL TATTOO INJECTION;  Surgeon: Wilhelmenia Aloha Raddle., MD;  Location: [facility]OSCOPY;  Service: Gastroenterology;;   SUBMUCOSAL TATTOO INJECTION  05/20/2023   Procedure: SUBMUCOSAL TATTOO INJECTION;  Surgeon: Legrand Victory LITTIE DOUGLAS, MD;  Location: [facility] ENDOSCOPY;  Service: Gastroenterology;;   VIDEO BRONCHOSCOPY Bilateral 05/08/2016   Procedure: VIDEO BRONCHOSCOPY WITH FLUORO;  Surgeon: Harden LULLA Staff, MD;  Location: [facility] ENDOSCOPY;  Service: Cardiopulmonary;  Laterality: Bilateral;    Allergies  Allergen Reactions   Lisinopril  Cough     Current Outpatient Medications  Medication Sig Dispense Refill   Accu-Chek Softclix Lancets lancets Use to check blood sugar once daily. 100 each 6   acetaminophen  (TYLENOL ) 500 MG tablet Take 2 tablets (1,000 mg total) by mouth every 8 (eight) hours as needed for moderate pain. 100 tablet 1   albuterol  (PROVENTIL ) (2.5 MG/3ML) 0.083% nebulizer solution Take 3 mLs (2.5 mg total) by nebulization every 2 (two) hours as needed for wheezing or shortness of breath.     albuterol  (VENTOLIN  HFA) 108 (90 Base) MCG/ACT inhaler INHALE 2 PUFFS INTO THE LUNGS EVERY 6 (SIX) HOURS AS NEEDED FOR WHEEZING OR SHORTNESS OF BREATH.     atorvastatin  (LIPITOR ) 80 MG tablet Take 1 tablet (80 mg total) by mouth daily. 90 tablet 1   Blood Glucose Monitoring Suppl (ACCU-CHEK GUIDE) w/Device KIT Use to check blood sugar once daily. 1 kit 0   Budeson-Glycopyrrol-Formoterol  (BREZTRI  AEROSPHERE) 160-9-4.8 MCG/ACT AERO Take 2 puffs first thing in morning and then another 2 puffs about 12 hours later. 10.7 g 1   DULoxetine  (CYMBALTA ) 60 MG capsule Take 1 capsule (60 mg total) by mouth daily. For chronic leg pains 30 capsule 3   ergocalciferol  (DRISDOL ) 1.25 MG (50000 UT) capsule Take 1 capsule (50,000 Units total) by mouth once a week. 12 capsule 1   ezetimibe  (ZETIA ) 10 MG tablet Take 1 tablet (10 mg total) by mouth daily. 90 tablet 2   feeding supplement (ENSURE ENLIVE / ENSURE PLUS) LIQD Take 237 mLs by mouth 2 (two) times daily between meals.     ferrous sulfate  (FEROSUL) 325 (65 FE) MG tablet Take 1 tablet (325 mg total) by mouth 2 (two) times daily with a meal.     folic acid  (FOLVITE ) 1 MG tablet Take 1 tablet (1 mg total) by mouth daily. 90 tablet 1   furosemide  (LASIX ) 40 MG tablet Take 1 tablet (40 mg total) by mouth daily.     gabapentin  (NEURONTIN ) 300 MG capsule Take 1 capsule (300 mg total) by mouth 2 (two) times daily. 90 capsule 1   glucose blood (ACCU-CHEK GUIDE) test strip Use to check blood sugar once  daily. 100 each 6   hydrALAZINE  (APRESOLINE ) 50 MG tablet Take 1 tablet (50 mg total) by mouth every 8 (eight) hours. 270 tablet 1   isosorbide  mononitrate (IMDUR ) 30 MG 24 hr tablet Take 1 tablet (30 mg total) by mouth daily. 90 tablet 1   metoprolol  succinate (TOPROL -XL) 50 MG 24 hr tablet Take 1.5 tablets (75 mg total) by mouth daily. Take with or immediately following a meal.     Multiple Vitamin (MULTIVITAMIN WITH MINERALS) TABS tablet Take 1 tablet by mouth daily.     nitroGLYCERIN  (NITROSTAT ) 0.4 MG SL tablet Place 1 tablet (0.4 mg total) under the tongue every 5 (five) minutes as needed for chest pain.  OXYGEN Inhale 2 L/min into the lungs continuous.     pantoprazole (PROTONIX) 40 MG tablet Take 1 tablet (40 mg total) by mouth daily. 90 tablet 1   polyethylene glycol (MIRALAX / GLYCOLAX) 17 g packet Take 17 g by mouth daily. 14 each 0   rivaroxaban (XARELTO) 20 MG TABS tablet Take 1 tablet (20 mg total) by mouth daily with supper. 90 tablet 0   No current facility-administered medications for this visit.    Family History  Problem Relation Age of Onset   Heart disease Mother    Diabetes Mother    Colon cancer Mother    Liver cancer Mother    Cancer Father        type unknown   Diabetes Sister        x 2   Diabetes Brother     Social History   Socioeconomic History   Marital status: Single    Spouse name: Not on file   Number of children: 1   Years of education: Not on file   Highest education level: 12th grade  Occupational History   Occupation: retired  Tobacco Use   Smoking status: Every Day    Current packs/day: 0.50    Average packs/day: 0.5 packs/day for 47.0 years (23.5 ttl pk-yrs)    Types: Cigarettes   Smokeless tobacco: Never   Tobacco comments:    4/5 cigs  Vaping Use   Vaping status: Never Used  Substance and Sexual Activity   Alcohol use: Not Currently    Alcohol/week: 0.0 standard drinks of alcohol    Comment: last drink was before xmas    Drug use: No    Types: Cocaine, Marijuana    Comment: "nothing in 20 years"   Sexual activity: Not Currently  Other Topics Concern   Not on file  Social History Narrative   unemployed   Social Drivers of Corporate investment banker Strain: Low Risk  (06/10/2023)   Overall Financial Resource Strain (CARDIA)    Difficulty of Paying Living Expenses: Not hard at all  Food Insecurity: No Food Insecurity (06/13/2023)   Hunger Vital Sign    Worried About Running Out of Food in the Last Year: Never true    Ran Out of Food in the Last Year: Never true  Recent Concern: Food Insecurity - Food Insecurity Present (06/10/2023)   Hunger Vital Sign    Worried About Running Out of Food in the Last Year: Sometimes true    Ran Out of Food in the Last Year: Never true  Transportation Needs: No Transportation Needs (06/13/2023)   PRAPARE - Administrator, Civil Service (Medical): No    Lack of Transportation (Non-Medical): No  Physical Activity: Insufficiently Active (06/10/2023)   Exercise Vital Sign    Days of Exercise per Week: 3 days    Minutes of Exercise per Session: 20 min  Stress: No Stress Concern Present (06/10/2023)   [employer] of Occupational Health - Occupational Stress Questionnaire    Feeling of Stress : Not at all  Social Connections: Moderately Isolated (06/10/2023)   Social Connection and Isolation Panel [NHANES]    Frequency of Communication with Friends and Family: More than three times a week    Frequency of Social Gatherings with Friends and Family: Never    Attends Religious Services: Never    Database administrator or Organizations: Yes    Attends Banker Meetings: Never    Marital Status: Divorced  Intimate Partner Violence: Not At Risk (06/13/2023)   Humiliation, Afraid, Rape, and Kick questionnaire    Fear of Current or Ex-Partner: No    Emotionally Abused: No    Physically Abused: No    Sexually Abused: No     REVIEW OF SYSTEMS:    [X]  denotes positive finding, [ ]  denotes negative finding Cardiac  Comments:  Chest pain or chest pressure:    Shortness of breath upon exertion:    Short of breath when lying flat:    Irregular heart rhythm:        Vascular    Pain in calf, thigh, or hip brought on by ambulation: x   Pain in feet at night that wakes you up from your sleep:     Blood clot in your veins:    Leg swelling:         Pulmonary    Oxygen at home: x   Productive cough:     Wheezing:         Neurologic    Sudden weakness in arms or legs:     Sudden numbness in arms or legs:     Sudden onset of difficulty speaking or slurred speech:    Temporary loss of vision in one eye:     Problems with dizziness:         Gastrointestinal    Blood in stool:     Vomited blood:         Genitourinary    Burning when urinating:     Blood in urine:        Psychiatric    Major depression:         Hematologic    Bleeding problems:    Problems with blood clotting too easily:        Skin    Rashes or ulcers:        Constitutional    Fever or chills:      PHYSICAL EXAMINATION:  Today's Vitals   07/19/23 1404  BP: 132/74  Pulse: (!) 115  Resp: (!) 22  Temp: 98.3 F (36.8 C)  TempSrc: Temporal  SpO2: 90%  Weight: 203 lb 3.2 oz (92.2 kg)  Height: 5\' 5"  (1.651 m)  PainSc: 7    Body mass index is 33.81 kg/m.   General:  WDWN in NAD; vital signs documented above Gait: Not observed HENT: WNL, normocephalic Pulmonary: normal non-labored breathing , without wheezing Cardiac: regular HR, without carotid bruits Abdomen: soft, tender to palpation due to recent surgery Skin: without rashes Vascular Exam/Pulses:  Right Left  Radial 2+ (normal) 2+ (normal)  Femoral 2+ (normal) 2+ (normal)   Extremities: without ischemic changes, without Gangrene , without cellulitis; without open wounds; pedal pulses are not palpable Musculoskeletal: no muscle wasting or atrophy  Neurologic: A&O X 3 Psychiatric:   The pt has Normal affect.   Non-Invasive Vascular Imaging:   ABI's/TBI's on 07/19/2023: Right:  0.76/0.60 - Great toe pressure: 84 Left:  0.24/0 - Great toe pressure: 0  Arterial duplex on 07/19/2023: +-----------+--------+-----+---------------+----------+--------+  LEFT      PSV cm/sRatioStenosis       Waveform  Comments  +-----------+--------+-----+---------------+----------+--------+  CFA Mid    108                         monophasic          +-----------+--------+-----+---------------+----------+--------+  DFA       517  75-99% stenosistriphasic           +-----------+--------+-----+---------------+----------+--------+  POP Prox                occluded                           +-----------+--------+-----+---------------+----------+--------+  TP Trunk   42                          monophasic          +-----------+--------+-----+---------------+----------+--------+  ATA Distal 31                          monophasic          +-----------+--------+-----+---------------+----------+--------+  PTA Distal 23                          monophasic          +-----------+--------+-----+---------------+----------+--------+  PERO Distal                                      NWV       +-----------+--------+-----+---------------+----------+--------+     Left Stent(s):  +---------------+--------+--------+--------+----------+  femoropoplitealPSV cm/sStenosisWaveformComments    +---------------+--------+--------+--------+----------+  Prox to Stent  39                                  +---------------+--------+--------+--------+----------+  Proximal Stent 41                      retrograde  +---------------+--------+--------+--------+----------+  Mid Stent              occluded                    +---------------+--------+--------+--------+----------+  Distal Stent           occluded                     +---------------+--------+--------+--------+----------+  Distal to Stent        occluded                    +---------------+--------+--------+--------+----------+   Summary:  Left: 75-99% stenosis noted in the deep femoral artery. Occluded  femoropopliteal stent.    Previous ABI's/TBI's on 01/15/2023: Right:  0.88/0.83 - Great toe pressure: 101 Left:  0.96/1.16 - Great toe pressure:  141  Previous arterial duplex on 01/15/2023: +-----------+--------+-----+---------------+---------+--------+  LEFT      PSV cm/sRatioStenosis       Waveform Comments  +-----------+--------+-----+---------------+---------+--------+  CFA Prox   80                          biphasic           +-----------+--------+-----+---------------+---------+--------+  DFA       277          50-74% stenosistriphasic          +-----------+--------+-----+---------------+---------+--------+  POP Prox   141                         biphasic           +-----------+--------+-----+---------------+---------+--------+  TP  Trunk   59                          biphasic           +-----------+--------+-----+---------------+---------+--------+  ATA Distal 40                          biphasic           +-----------+--------+-----+---------------+---------+--------+  PTA Distal 53                          biphasic           +-----------+--------+-----+---------------+---------+--------+  PERO Distal29                          biphasic           +-----------+--------+-----+---------------+---------+--------+     Left Stent(s):  +---------------+--------+--------+--------+--------+  femoropoplitealPSV cm/sStenosisWaveformComments  +---------------+--------+--------+--------+--------+  Prox to Stent  87              biphasic          +---------------+--------+--------+--------+--------+  Proximal Stent 86              biphasic           +---------------+--------+--------+--------+--------+  Mid Stent      87              biphasic          +---------------+--------+--------+--------+--------+  Distal Stent   68              biphasic          +---------------+--------+--------+--------+--------+  Distal to Stent60              biphasic          +---------------+--------+--------+--------+--------+   Summary:  Left: 50-74% stenosis noted in the deep femoral artery. Patent stent without evidence of stenosis.     ASSESSMENT/PLAN:: 68 y.o. male here for follow up for PAD with hx of angiogram with left femoropopliteal angioplasty and stenting 12/07/2022 by Dr. Lenell Antu for rest pain  Critical limb ischemia -pt returns today for disabling claudication and has been present for about 3 weeks.  His duplex reveals his stent is occluded and a significantly high velocity of the deep femoral artery.  His ABI has dropped from .96 to 0.24.  Will plan for arteriogram with possible intervention with Dr. Lenell Antu.  He has an appt for iron infusion next Friday but most likely will try to reschedule that so he can have his aortogram.  He is on Xarelto and this will need to be held.  -continue statin -encouraged him to protect his foot and walk as much as possible  Current smoker -discussed that given his hx of PAD and diabetes, if he continues to smoke he is at high risk of amputation.  Discussed this also causes heart attack and stroke.    Doreatha Massed, [facility] [phone]  Clinic MD:   Hetty Blend

## 2023-07-19 NOTE — H&P (View-Only) (Signed)
 HISTORY AND PHYSICAL     CC:  follow up. Requesting Provider:  Delbert Clam, MD  HPI: This is a 68 y.o. male who is here today for follow up for PAD.  Pt has hx of angiogram with left femoropopliteal angioplasty and stenting 12/07/2022 by Dr. Magda for rest pain.  Pt was last seen 01/15/2023 and at that time, he was doing well.    The pt returns today for follow up.  He states that he got out of the hospital after Christmas for a ruptured appendix.  He states that his leg started hurting about the 1st week of January.  He states that it has been gradual in onset.  He now walks a very short distance and develops claudication in the left leg.  He does not have any wounds on his feet.  He is on asa and statin as well as Xarelto .   Pt is on continuous home O2.  He continues to smoke.  He was in the [employer].    The pt is on a statin for cholesterol management.    The pt is not on an aspirin .    Other AC:  Xarelto  The pt is on BB, diuretic, hydralazine  for hypertension.  The pt is diabetic Tobacco hx:  current  Pt does not have family hx of AAA.  Past Medical History:  Diagnosis Date   AVM (arteriovenous malformation)    CAD (coronary artery disease)    a. LHC 5/12:  LAD 20, pLCx 20, pRCA 40, dRCA 40, EF 35%, diff HK  //  b. Myoview 4/16: Overall Impression:  High risk stress nuclear study There is no evidence of ischemia.  There is severe LV dysfunction. LV Ejection Fraction: 30%.  LV Wall Motion:  There is global LV hypokinesis.     CAP (community acquired pneumonia) 09/2013   Chronic combined systolic and diastolic CHF (congestive heart failure) (HCC)    a. Echo 4/16:Mild LVH, EF 40-45%, diffuse HK //  b. Echo 8/17: EF 35-40%, diffuse HK, diastolic dysfunction, aortic sclerosis, trivial MR, moderate LAE, normal RVSF, moderate RAE, mild TR, PASP 42 mmHg // c. Echo 4/18: Mild concentric LVH, EF 30-35, normal wall motion, grade 1 diastolic dysfunction, PASP 49   Chronic respiratory  failure (HCC)    Cluster headache    hx; haven't had one in awhile (01/09/2016)   COPD (chronic obstructive pulmonary disease) (HCC)    thelbert 01/09/2016   DM (diabetes mellitus) (HCC)    History of CVA (cerebrovascular accident)    Hypertension    IDA (iron  deficiency anemia)    Moderate tobacco use disorder    NICM (nonischemic cardiomyopathy) (HCC)    Nicotine  addiction    Tobacco abuse    Type 2 diabetes mellitus (HCC) 05/14/2016    Past Surgical History:  Procedure Laterality Date   ABDOMINAL AORTOGRAM W/LOWER EXTREMITY N/A 12/07/2022   Procedure: ABDOMINAL AORTOGRAM W/LOWER EXTREMITY;  Surgeon: Magda Debby SAILOR, MD;  Location: [facility] INVASIVE CV LAB;  Service: Cardiovascular;  Laterality: N/A;   BIOPSY  11/12/2020   Procedure: BIOPSY;  Surgeon: Wilhelmenia Aloha Raddle., MD;  Location: [facility] ENDOSCOPY;  Service: Gastroenterology;;   CARDIAC CATHETERIZATION  10/2010   LM normal, LAD with 20% irregularities, LCX with 20%, RCA with 40% prox and 40% distal - EF of 35%   CATARACT EXTRACTION, BILATERAL     COLONOSCOPY W/ BIOPSIES AND POLYPECTOMY     COLONOSCOPY WITH PROPOFOL  N/A 09/06/2018   Procedure: COLONOSCOPY WITH PROPOFOL ;  Surgeon: Eda,  Suzen, MD;  Location: [facility] ENDOSCOPY;  Service: Gastroenterology;  Laterality: N/A;   ENTEROSCOPY N/A 09/28/2018   Procedure: ENTEROSCOPY;  Surgeon: Rollin Dover, MD;  Location: [facility] ENDOSCOPY;  Service: Endoscopy;  Laterality: N/A;   ENTEROSCOPY N/A 10/28/2018   Procedure: ENTEROSCOPY;  Surgeon: Eda Suzen, MD;  Location: [facility] ENDOSCOPY;  Service: Gastroenterology;  Laterality: N/A;   ENTEROSCOPY N/A 10/09/2020   Procedure: ENTEROSCOPY;  Surgeon: Abran Norleen SAILOR, MD;  Location: [facility] ENDOSCOPY;  Service: Endoscopy;  Laterality: N/A;   ENTEROSCOPY N/A 03/22/2022   Procedure: ENTEROSCOPY;  Surgeon: Leigh Elspeth SQUIBB, MD;  Location: [facility] ENDOSCOPY;  Service: Gastroenterology;  Laterality: N/A;   ENTEROSCOPY N/A 05/20/2023   Procedure: ENTEROSCOPY;   Surgeon: Legrand Victory LITTIE DOUGLAS, MD;  Location: [facility] ENDOSCOPY;  Service: Gastroenterology;  Laterality: N/A;   ESOPHAGOGASTRODUODENOSCOPY N/A 11/12/2020   Procedure: ESOPHAGOGASTRODUODENOSCOPY (EGD);  Surgeon: Wilhelmenia Aloha Raddle., MD;  Location: [facility] ENDOSCOPY;  Service: Gastroenterology;  Laterality: N/A;   ESOPHAGOGASTRODUODENOSCOPY (EGD) WITH PROPOFOL  N/A 09/05/2018   Procedure: ESOPHAGOGASTRODUODENOSCOPY (EGD) WITH PROPOFOL ;  Surgeon: Leigh Elspeth SQUIBB, MD;  Location: [facility] ENDOSCOPY;  Service: Gastroenterology;  Laterality: N/A;   ESOPHAGOGASTRODUODENOSCOPY (EGD) WITH PROPOFOL  N/A 11/19/2020   Procedure: ESOPHAGOGASTRODUODENOSCOPY (EGD) WITH PROPOFOL ;  Surgeon: Albertus Gordy HERO, MD;  Location: [facility] ENDOSCOPY;  Service: Gastroenterology;  Laterality: N/A;   ESOPHAGOGASTRODUODENOSCOPY (EGD) WITH PROPOFOL  N/A 12/27/2022   Procedure: ESOPHAGOGASTRODUODENOSCOPY (EGD) WITH PROPOFOL ;  Surgeon: Shila Gustav GAILS, MD;  Location: [facility] ENDOSCOPY;  Service: Gastroenterology;  Laterality: N/A;   EXCISION MASS HEAD     GIVENS CAPSULE STUDY N/A 09/06/2018   Procedure: GIVENS CAPSULE STUDY;  Surgeon: Eda Suzen, MD;  Location: [facility] ENDOSCOPY;  Service: Gastroenterology;  Laterality: N/A;   GIVENS CAPSULE STUDY N/A 09/26/2018   Procedure: GIVENS CAPSULE STUDY;  Surgeon: Albertus Gordy HERO, MD;  Location: [facility] ENDOSCOPY;  Service: Gastroenterology;  Laterality: N/A;   GIVENS CAPSULE STUDY N/A 06/14/2019   Procedure: GIVENS CAPSULE STUDY;  Surgeon: Shila Gustav GAILS, MD;  Location: [facility] ENDOSCOPY;  Service: Endoscopy;  Laterality: N/A;   HEMOSTASIS CLIP PLACEMENT  11/12/2020   Procedure: HEMOSTASIS CLIP PLACEMENT;  Surgeon: Wilhelmenia Aloha Raddle., MD;  Location: [facility] ENDOSCOPY;  Service: Gastroenterology;;   HEMOSTASIS CONTROL  11/12/2020   Procedure: HEMOSTASIS CONTROL;  Surgeon: Wilhelmenia Aloha Raddle., MD;  Location: [facility] ENDOSCOPY;  Service: Gastroenterology;;   HOT HEMOSTASIS N/A 10/28/2018   Procedure: HOT HEMOSTASIS (ARGON  PLASMA COAGULATION/BICAP);  Surgeon: Eda Suzen, MD;  Location: [facility] ENDOSCOPY;  Service: Gastroenterology;  Laterality: N/A;   HOT HEMOSTASIS N/A 11/12/2020   Procedure: HOT HEMOSTASIS (ARGON PLASMA COAGULATION/BICAP);  Surgeon: Wilhelmenia Aloha Raddle., MD;  Location: [facility] ENDOSCOPY;  Service: Gastroenterology;  Laterality: N/A;   HOT HEMOSTASIS N/A 03/22/2022   Procedure: HOT HEMOSTASIS (ARGON PLASMA COAGULATION/BICAP);  Surgeon: Leigh Elspeth SQUIBB, MD;  Location: [facility] ENDOSCOPY;  Service: Gastroenterology;  Laterality: N/A;   HOT HEMOSTASIS N/A 05/20/2023   Procedure: HOT HEMOSTASIS (ARGON PLASMA COAGULATION/BICAP);  Surgeon: Legrand Victory LITTIE DOUGLAS, MD;  Location: [facility] ENDOSCOPY;  Service: Gastroenterology;  Laterality: N/A;   INCISION AND DRAINAGE PERIRECTAL ABSCESS N/A 06/05/2017   Procedure: IRRIGATION AND DEBRIDEMENT PERIRECTAL ABSCESS;  Surgeon: Teresa Lonni HERO, MD;  Location: [facility] OR;  Service: General;  Laterality: N/A;   IR CATHETER TUBE CHANGE  05/14/2023   LAPAROSCOPIC APPENDECTOMY N/A 05/01/2023   Procedure: APPENDECTOMY LAPAROSCOPIC;  Surgeon: Ebbie Cough, MD;  Location: [facility] OR;  Service: General;  Laterality: N/A;   PERIPHERAL VASCULAR INTERVENTION  12/07/2022   Procedure: PERIPHERAL VASCULAR INTERVENTION;  Surgeon: Magda Debby SAILOR, MD;  Location: [facility] INVASIVE CV LAB;  Service: Cardiovascular;;   SUBMUCOSAL TATTOO INJECTION  11/12/2020   Procedure: SUBMUCOSAL TATTOO INJECTION;  Surgeon: Wilhelmenia Aloha Raddle., MD;  Location: [facility]OSCOPY;  Service: Gastroenterology;;   SUBMUCOSAL TATTOO INJECTION  05/20/2023   Procedure: SUBMUCOSAL TATTOO INJECTION;  Surgeon: Legrand Victory LITTIE DOUGLAS, MD;  Location: [facility] ENDOSCOPY;  Service: Gastroenterology;;   VIDEO BRONCHOSCOPY Bilateral 05/08/2016   Procedure: VIDEO BRONCHOSCOPY WITH FLUORO;  Surgeon: Harden LULLA Staff, MD;  Location: [facility] ENDOSCOPY;  Service: Cardiopulmonary;  Laterality: Bilateral;    Allergies  Allergen Reactions   Lisinopril  Cough     Current Outpatient Medications  Medication Sig Dispense Refill   Accu-Chek Softclix Lancets lancets Use to check blood sugar once daily. 100 each 6   acetaminophen  (TYLENOL ) 500 MG tablet Take 2 tablets (1,000 mg total) by mouth every 8 (eight) hours as needed for moderate pain. 100 tablet 1   albuterol  (PROVENTIL ) (2.5 MG/3ML) 0.083% nebulizer solution Take 3 mLs (2.5 mg total) by nebulization every 2 (two) hours as needed for wheezing or shortness of breath.     albuterol  (VENTOLIN  HFA) 108 (90 Base) MCG/ACT inhaler INHALE 2 PUFFS INTO THE LUNGS EVERY 6 (SIX) HOURS AS NEEDED FOR WHEEZING OR SHORTNESS OF BREATH.     atorvastatin  (LIPITOR ) 80 MG tablet Take 1 tablet (80 mg total) by mouth daily. 90 tablet 1   Blood Glucose Monitoring Suppl (ACCU-CHEK GUIDE) w/Device KIT Use to check blood sugar once daily. 1 kit 0   Budeson-Glycopyrrol-Formoterol  (BREZTRI  AEROSPHERE) 160-9-4.8 MCG/ACT AERO Take 2 puffs first thing in morning and then another 2 puffs about 12 hours later. 10.7 g 1   DULoxetine  (CYMBALTA ) 60 MG capsule Take 1 capsule (60 mg total) by mouth daily. For chronic leg pains 30 capsule 3   ergocalciferol  (DRISDOL ) 1.25 MG (50000 UT) capsule Take 1 capsule (50,000 Units total) by mouth once a week. 12 capsule 1   ezetimibe  (ZETIA ) 10 MG tablet Take 1 tablet (10 mg total) by mouth daily. 90 tablet 2   feeding supplement (ENSURE ENLIVE / ENSURE PLUS) LIQD Take 237 mLs by mouth 2 (two) times daily between meals.     ferrous sulfate  (FEROSUL) 325 (65 FE) MG tablet Take 1 tablet (325 mg total) by mouth 2 (two) times daily with a meal.     folic acid  (FOLVITE ) 1 MG tablet Take 1 tablet (1 mg total) by mouth daily. 90 tablet 1   furosemide  (LASIX ) 40 MG tablet Take 1 tablet (40 mg total) by mouth daily.     gabapentin  (NEURONTIN ) 300 MG capsule Take 1 capsule (300 mg total) by mouth 2 (two) times daily. 90 capsule 1   glucose blood (ACCU-CHEK GUIDE) test strip Use to check blood sugar once  daily. 100 each 6   hydrALAZINE  (APRESOLINE ) 50 MG tablet Take 1 tablet (50 mg total) by mouth every 8 (eight) hours. 270 tablet 1   isosorbide  mononitrate (IMDUR ) 30 MG 24 hr tablet Take 1 tablet (30 mg total) by mouth daily. 90 tablet 1   metoprolol  succinate (TOPROL -XL) 50 MG 24 hr tablet Take 1.5 tablets (75 mg total) by mouth daily. Take with or immediately following a meal.     Multiple Vitamin (MULTIVITAMIN WITH MINERALS) TABS tablet Take 1 tablet by mouth daily.     nitroGLYCERIN  (NITROSTAT ) 0.4 MG SL tablet Place 1 tablet (0.4 mg total) under the tongue every 5 (five) minutes as needed for chest pain.  OXYGEN  Inhale 2 L/min into the lungs continuous.     pantoprazole  (PROTONIX ) 40 MG tablet Take 1 tablet (40 mg total) by mouth daily. 90 tablet 1   polyethylene glycol (MIRALAX  / GLYCOLAX ) 17 g packet Take 17 g by mouth daily. 14 each 0   rivaroxaban  (XARELTO ) 20 MG TABS tablet Take 1 tablet (20 mg total) by mouth daily with supper. 90 tablet 0   No current facility-administered medications for this visit.    Family History  Problem Relation Age of Onset   Heart disease Mother    Diabetes Mother    Colon cancer Mother    Liver cancer Mother    Cancer Father        type unknown   Diabetes Sister        x 2   Diabetes Brother     Social History   Socioeconomic History   Marital status: Single    Spouse name: Not on file   Number of children: 1   Years of education: Not on file   Highest education level: 12th grade  Occupational History   Occupation: retired  Tobacco Use   Smoking status: Every Day    Current packs/day: 0.50    Average packs/day: 0.5 packs/day for 47.0 years (23.5 ttl pk-yrs)    Types: Cigarettes   Smokeless tobacco: Never   Tobacco comments:    4/5 cigs  Vaping Use   Vaping status: Never Used  Substance and Sexual Activity   Alcohol use: Not Currently    Alcohol/week: 0.0 standard drinks of alcohol    Comment: last drink was before xmas    Drug use: No    Types: Cocaine, Marijuana    Comment: nothing in 20 years   Sexual activity: Not Currently  Other Topics Concern   Not on file  Social History Narrative   unemployed   Social Drivers of Corporate Investment Banker Strain: Low Risk  (06/10/2023)   Overall Financial Resource Strain (CARDIA)    Difficulty of Paying Living Expenses: Not hard at all  Food Insecurity: No Food Insecurity (06/13/2023)   Hunger Vital Sign    Worried About Running Out of Food in the Last Year: Never true    Ran Out of Food in the Last Year: Never true  Recent Concern: Food Insecurity - Food Insecurity Present (06/10/2023)   Hunger Vital Sign    Worried About Running Out of Food in the Last Year: Sometimes true    Ran Out of Food in the Last Year: Never true  Transportation Needs: No Transportation Needs (06/13/2023)   PRAPARE - Administrator, Civil Service (Medical): No    Lack of Transportation (Non-Medical): No  Physical Activity: Insufficiently Active (06/10/2023)   Exercise Vital Sign    Days of Exercise per Week: 3 days    Minutes of Exercise per Session: 20 min  Stress: No Stress Concern Present (06/10/2023)   [employer] of Occupational Health - Occupational Stress Questionnaire    Feeling of Stress : Not at all  Social Connections: Moderately Isolated (06/10/2023)   Social Connection and Isolation Panel [NHANES]    Frequency of Communication with Friends and Family: More than three times a week    Frequency of Social Gatherings with Friends and Family: Never    Attends Religious Services: Never    Database Administrator or Organizations: Yes    Attends Banker Meetings: Never    Marital Status: Divorced  Intimate Partner Violence: Not At Risk (06/13/2023)   Humiliation, Afraid, Rape, and Kick questionnaire    Fear of Current or Ex-Partner: No    Emotionally Abused: No    Physically Abused: No    Sexually Abused: No     REVIEW OF SYSTEMS:    [X]  denotes positive finding, [ ]  denotes negative finding Cardiac  Comments:  Chest pain or chest pressure:    Shortness of breath upon exertion:    Short of breath when lying flat:    Irregular heart rhythm:        Vascular    Pain in calf, thigh, or hip brought on by ambulation: x   Pain in feet at night that wakes you up from your sleep:     Blood clot in your veins:    Leg swelling:         Pulmonary    Oxygen  at home: x   Productive cough:     Wheezing:         Neurologic    Sudden weakness in arms or legs:     Sudden numbness in arms or legs:     Sudden onset of difficulty speaking or slurred speech:    Temporary loss of vision in one eye:     Problems with dizziness:         Gastrointestinal    Blood in stool:     Vomited blood:         Genitourinary    Burning when urinating:     Blood in urine:        Psychiatric    Major depression:         Hematologic    Bleeding problems:    Problems with blood clotting too easily:        Skin    Rashes or ulcers:        Constitutional    Fever or chills:      PHYSICAL EXAMINATION:  Today's Vitals   07/19/23 1404  BP: 132/74  Pulse: (!) 115  Resp: (!) 22  Temp: 98.3 F (36.8 C)  TempSrc: Temporal  SpO2: 90%  Weight: 203 lb 3.2 oz (92.2 kg)  Height: 5' 5 (1.651 m)  PainSc: 7    Body mass index is 33.81 kg/m.   General:  WDWN in NAD; vital signs documented above Gait: Not observed HENT: WNL, normocephalic Pulmonary: normal non-labored breathing , without wheezing Cardiac: regular HR, without carotid bruits Abdomen: soft, tender to palpation due to recent surgery Skin: without rashes Vascular Exam/Pulses:  Right Left  Radial 2+ (normal) 2+ (normal)  Femoral 2+ (normal) 2+ (normal)   Extremities: without ischemic changes, without Gangrene , without cellulitis; without open wounds; pedal pulses are not palpable Musculoskeletal: no muscle wasting or atrophy  Neurologic: A&O X 3 Psychiatric:   The pt has Normal affect.   Non-Invasive Vascular Imaging:   ABI's/TBI's on 07/19/2023: Right:  0.76/0.60 - Great toe pressure: 84 Left:  0.24/0 - Great toe pressure: 0  Arterial duplex on 07/19/2023: +-----------+--------+-----+---------------+----------+--------+  LEFT      PSV cm/sRatioStenosis       Waveform  Comments  +-----------+--------+-----+---------------+----------+--------+  CFA Mid    108                         monophasic          +-----------+--------+-----+---------------+----------+--------+  DFA       517  75-99% stenosistriphasic           +-----------+--------+-----+---------------+----------+--------+  POP Prox                occluded                           +-----------+--------+-----+---------------+----------+--------+  TP Trunk   42                          monophasic          +-----------+--------+-----+---------------+----------+--------+  ATA Distal 31                          monophasic          +-----------+--------+-----+---------------+----------+--------+  PTA Distal 23                          monophasic          +-----------+--------+-----+---------------+----------+--------+  PERO Distal                                      NWV       +-----------+--------+-----+---------------+----------+--------+     Left Stent(s):  +---------------+--------+--------+--------+----------+  femoropoplitealPSV cm/sStenosisWaveformComments    +---------------+--------+--------+--------+----------+  Prox to Stent  39                                  +---------------+--------+--------+--------+----------+  Proximal Stent 41                      retrograde  +---------------+--------+--------+--------+----------+  Mid Stent              occluded                    +---------------+--------+--------+--------+----------+  Distal Stent           occluded                     +---------------+--------+--------+--------+----------+  Distal to Stent        occluded                    +---------------+--------+--------+--------+----------+   Summary:  Left: 75-99% stenosis noted in the deep femoral artery. Occluded  femoropopliteal stent.    Previous ABI's/TBI's on 01/15/2023: Right:  0.88/0.83 - Great toe pressure: 101 Left:  0.96/1.16 - Great toe pressure:  141  Previous arterial duplex on 01/15/2023: +-----------+--------+-----+---------------+---------+--------+  LEFT      PSV cm/sRatioStenosis       Waveform Comments  +-----------+--------+-----+---------------+---------+--------+  CFA Prox   80                          biphasic           +-----------+--------+-----+---------------+---------+--------+  DFA       277          50-74% stenosistriphasic          +-----------+--------+-----+---------------+---------+--------+  POP Prox   141                         biphasic           +-----------+--------+-----+---------------+---------+--------+  TP  Trunk   59                          biphasic           +-----------+--------+-----+---------------+---------+--------+  ATA Distal 40                          biphasic           +-----------+--------+-----+---------------+---------+--------+  PTA Distal 53                          biphasic           +-----------+--------+-----+---------------+---------+--------+  PERO Distal29                          biphasic           +-----------+--------+-----+---------------+---------+--------+     Left Stent(s):  +---------------+--------+--------+--------+--------+  femoropoplitealPSV cm/sStenosisWaveformComments  +---------------+--------+--------+--------+--------+  Prox to Stent  87              biphasic          +---------------+--------+--------+--------+--------+  Proximal Stent 86              biphasic           +---------------+--------+--------+--------+--------+  Mid Stent      87              biphasic          +---------------+--------+--------+--------+--------+  Distal Stent   68              biphasic          +---------------+--------+--------+--------+--------+  Distal to Stent60              biphasic          +---------------+--------+--------+--------+--------+   Summary:  Left: 50-74% stenosis noted in the deep femoral artery. Patent stent without evidence of stenosis.     ASSESSMENT/PLAN:: 68 y.o. male here for follow up for PAD with hx of angiogram with left femoropopliteal angioplasty and stenting 12/07/2022 by Dr. Magda for rest pain  Critical limb ischemia -pt returns today for disabling claudication and has been present for about 3 weeks.  His duplex reveals his stent is occluded and a significantly high velocity of the deep femoral artery.  His ABI has dropped from .96 to 0.24.  Will plan for arteriogram with possible intervention with Dr. Magda.  He has an appt for iron  infusion next Friday but most likely will try to reschedule that so he can have his aortogram.  He is on Xarelto  and this will need to be held.  -continue statin -encouraged him to protect his foot and walk as much as possible  Current smoker -discussed that given his hx of PAD and diabetes, if he continues to smoke he is at high risk of amputation.  Discussed this also causes heart attack and stroke.    Lucie Apt, [facility] [phone]  Clinic MD:   Pearline

## 2023-07-22 ENCOUNTER — Ambulatory Visit: Payer: Medicare HMO

## 2023-07-22 ENCOUNTER — Telehealth: Payer: Self-pay | Admitting: Hematology and Oncology

## 2023-07-22 ENCOUNTER — Other Ambulatory Visit: Payer: Self-pay | Admitting: *Deleted

## 2023-07-22 VITALS — BP 138/71 | HR 92 | Temp 98.3°F | Resp 20 | Ht 75.0 in | Wt 206.4 lb

## 2023-07-22 DIAGNOSIS — D500 Iron deficiency anemia secondary to blood loss (chronic): Secondary | ICD-10-CM

## 2023-07-22 DIAGNOSIS — I70222 Atherosclerosis of native arteries of extremities with rest pain, left leg: Secondary | ICD-10-CM

## 2023-07-22 DIAGNOSIS — D509 Iron deficiency anemia, unspecified: Secondary | ICD-10-CM

## 2023-07-22 MED ORDER — ACETAMINOPHEN 325 MG PO TABS
650.0000 mg | ORAL_TABLET | Freq: Once | ORAL | Status: DC
Start: 2023-07-22 — End: 2023-07-22

## 2023-07-22 MED ORDER — DIPHENHYDRAMINE HCL 25 MG PO CAPS: 25.0000 mg | ORAL_CAPSULE | Freq: Once | ORAL | Status: DC

## 2023-07-22 MED ORDER — IRON SUCROSE 20 MG/ML IV SOLN
200.0000 mg | Freq: Once | INTRAVENOUS | Status: AC
  Administered 2023-07-22: 200 mg via INTRAVENOUS
  Filled 2023-07-22: qty 10

## 2023-07-22 NOTE — Telephone Encounter (Signed)
 Scheduled appointments per 2/3 los. Patient is aware of the made appointments.

## 2023-07-22 NOTE — Progress Notes (Signed)
Diagnosis: Acute Anemia  Provider:  Chilton Greathouse MD  Procedure: IV Push  Patient refused pre-medications. Nurse educated patient and stressed the importance of taking pre-medications as a precaution in the event of a medication reaction. Patient verbalized understanding.   IV Type: Peripheral, IV Location: R Forearm  Venofer (Iron Sucrose), Dose: 200 mg  Post Infusion IV Care: Patient declined observation and Peripheral IV Discontinued  Discharge: Condition: Stable, Destination: Home . AVS Declined  Performed by:  Wyvonne Lenz, RN

## 2023-07-24 ENCOUNTER — Ambulatory Visit: Payer: Medicare HMO

## 2023-07-24 VITALS — BP 128/64 | HR 97 | Temp 98.5°F | Resp 20 | Ht 75.0 in | Wt 203.8 lb

## 2023-07-24 DIAGNOSIS — D509 Iron deficiency anemia, unspecified: Secondary | ICD-10-CM | POA: Diagnosis not present

## 2023-07-24 DIAGNOSIS — D500 Iron deficiency anemia secondary to blood loss (chronic): Secondary | ICD-10-CM

## 2023-07-24 MED ORDER — IRON SUCROSE 20 MG/ML IV SOLN
200.0000 mg | Freq: Once | INTRAVENOUS | Status: AC
  Administered 2023-07-24: 200 mg via INTRAVENOUS
  Filled 2023-07-24: qty 10

## 2023-07-24 MED ORDER — ACETAMINOPHEN 325 MG PO TABS: 650.0000 mg | ORAL_TABLET | Freq: Once | ORAL | Status: DC

## 2023-07-24 MED ORDER — DIPHENHYDRAMINE HCL 25 MG PO CAPS: 25.0000 mg | ORAL_CAPSULE | Freq: Once | ORAL | Status: DC

## 2023-07-24 NOTE — Progress Notes (Signed)
Diagnosis: Acute Anemia  Provider:  Chilton Greathouse MD  Procedure: IV Push  Patient refused pre-medications. Nurse educated patient and stressed the importance of taking pre-medications as a precaution in the event of a medication reaction. Patient verbalized understanding.   IV Type: Peripheral, IV Location: R Hand  Venofer (Iron Sucrose), Dose: 200 mg  Post Infusion IV Care: Patient declined observation and Peripheral IV Discontinued  Discharge: Condition: Stable, Destination: Home . AVS Declined  Performed by:  Wyvonne Lenz, RN

## 2023-07-26 ENCOUNTER — Ambulatory Visit (INDEPENDENT_AMBULATORY_CARE_PROVIDER_SITE_OTHER): Payer: Medicare HMO

## 2023-07-26 VITALS — BP 133/78 | HR 101 | Temp 98.1°F | Resp 20 | Ht 75.0 in | Wt 201.0 lb

## 2023-07-26 DIAGNOSIS — D509 Iron deficiency anemia, unspecified: Secondary | ICD-10-CM | POA: Diagnosis not present

## 2023-07-26 DIAGNOSIS — D500 Iron deficiency anemia secondary to blood loss (chronic): Secondary | ICD-10-CM

## 2023-07-26 MED ORDER — ACETAMINOPHEN 325 MG PO TABS
650.0000 mg | ORAL_TABLET | Freq: Once | ORAL | Status: DC
Start: 2023-07-26 — End: 2023-07-26

## 2023-07-26 MED ORDER — DIPHENHYDRAMINE HCL 25 MG PO CAPS: 25.0000 mg | ORAL_CAPSULE | Freq: Once | ORAL | Status: DC

## 2023-07-26 MED ORDER — IRON SUCROSE 20 MG/ML IV SOLN
200.0000 mg | Freq: Once | INTRAVENOUS | Status: AC
  Administered 2023-07-26: 200 mg via INTRAVENOUS
  Filled 2023-07-26: qty 10

## 2023-07-26 NOTE — Progress Notes (Signed)
Diagnosis: Iron Deficiency Anemia  Provider:  Chilton Greathouse MD  Patient refused pre-medications. Nurse educated patient and stressed the importance of taking pre-medications as a precaution in the event of a medication reaction. Patient verbalized understanding.  Procedure: IV Push  IV Type: Peripheral, IV Location: R Hand  Venofer (Iron Sucrose), Dose: 200 mg  Post Infusion IV Care: Patient declined observation and Peripheral IV Discontinued  Discharge: Condition: Good, Destination: Home . AVS Declined  Performed by:  Garnette Czech, RN

## 2023-07-29 ENCOUNTER — Ambulatory Visit: Payer: Medicare HMO

## 2023-07-29 VITALS — BP 103/53 | HR 109 | Temp 98.0°F | Resp 20 | Ht 75.0 in | Wt 201.4 lb

## 2023-07-29 DIAGNOSIS — J449 Chronic obstructive pulmonary disease, unspecified: Secondary | ICD-10-CM | POA: Diagnosis not present

## 2023-07-29 DIAGNOSIS — I110 Hypertensive heart disease with heart failure: Secondary | ICD-10-CM | POA: Diagnosis not present

## 2023-07-29 DIAGNOSIS — D509 Iron deficiency anemia, unspecified: Secondary | ICD-10-CM

## 2023-07-29 DIAGNOSIS — K651 Peritoneal abscess: Secondary | ICD-10-CM | POA: Diagnosis not present

## 2023-07-29 DIAGNOSIS — I5042 Chronic combined systolic (congestive) and diastolic (congestive) heart failure: Secondary | ICD-10-CM | POA: Diagnosis not present

## 2023-07-29 DIAGNOSIS — D500 Iron deficiency anemia secondary to blood loss (chronic): Secondary | ICD-10-CM

## 2023-07-29 DIAGNOSIS — I480 Paroxysmal atrial fibrillation: Secondary | ICD-10-CM | POA: Diagnosis not present

## 2023-07-29 DIAGNOSIS — J9621 Acute and chronic respiratory failure with hypoxia: Secondary | ICD-10-CM | POA: Diagnosis not present

## 2023-07-29 DIAGNOSIS — I4892 Unspecified atrial flutter: Secondary | ICD-10-CM | POA: Diagnosis not present

## 2023-07-29 DIAGNOSIS — T8141XD Infection following a procedure, superficial incisional surgical site, subsequent encounter: Secondary | ICD-10-CM | POA: Diagnosis not present

## 2023-07-29 DIAGNOSIS — A419 Sepsis, unspecified organism: Secondary | ICD-10-CM | POA: Diagnosis not present

## 2023-07-29 MED ORDER — ACETAMINOPHEN 325 MG PO TABS: 650.0000 mg | ORAL_TABLET | Freq: Once | ORAL | Status: DC

## 2023-07-29 MED ORDER — IRON SUCROSE 20 MG/ML IV SOLN
200.0000 mg | Freq: Once | INTRAVENOUS | Status: AC
  Administered 2023-07-29: 200 mg via INTRAVENOUS
  Filled 2023-07-29: qty 10

## 2023-07-29 MED ORDER — DIPHENHYDRAMINE HCL 25 MG PO CAPS: 25.0000 mg | ORAL_CAPSULE | Freq: Once | ORAL | Status: DC

## 2023-07-29 NOTE — Progress Notes (Signed)
 Diagnosis: Acute Anemia  Provider:  Chilton Greathouse MD  Procedure: IV Push  IV Type: Peripheral, IV Location: R Forearm  Patient refused pre-medications. Nurse educated patient and stressed the importance of taking pre-medications as a precaution in the event of a medication reaction. Patient verbalized understanding.  Venofer (Iron Sucrose), Dose: 200 mg  Post Infusion IV Care: Patient declined observation and Peripheral IV Discontinued  Discharge: Condition: Good, Destination: Home . AVS Declined  Performed by:  Nat Math, RN

## 2023-07-31 ENCOUNTER — Ambulatory Visit: Payer: Medicare HMO

## 2023-08-01 ENCOUNTER — Other Ambulatory Visit: Payer: Self-pay | Admitting: Family Medicine

## 2023-08-01 ENCOUNTER — Other Ambulatory Visit: Payer: Self-pay

## 2023-08-01 DIAGNOSIS — I110 Hypertensive heart disease with heart failure: Secondary | ICD-10-CM

## 2023-08-01 MED ORDER — GLUCOSE BLOOD VI STRP
ORAL_STRIP | 6 refills | Status: DC
  Filled 2023-08-01: qty 100, 100d supply, fill #0

## 2023-08-01 NOTE — Telephone Encounter (Signed)
 Requested medication (s) are due for refill today - unsure  Requested medication (s) are on the active medication list -yes  Future visit scheduled -yes  Last refill: unsure- dated 05/21/23 without number and RF information  Notes to clinic: unclear- request has notes- discontinued- but still listed on current medication list  Requested Prescriptions  Pending Prescriptions Disp Refills   furosemide (LASIX) 40 MG tablet 60 tablet 2    Sig: Take 1 tablet (40 mg total) by mouth 2 (two) times daily.     Cardiovascular:  Diuretics - Loop Failed - 08/01/2023  4:08 PM      Failed - K in normal range and within 180 days    Potassium  Date Value Ref Range Status  06/10/2023 3.4 (L) 3.5 - 5.2 mmol/L Final         Passed - Ca in normal range and within 180 days    Calcium  Date Value Ref Range Status  06/10/2023 9.4 8.6 - 10.2 mg/dL Final   Calcium, Ion  Date Value Ref Range Status  12/27/2022 1.16 1.15 - 1.40 mmol/L Final         Passed - Na in normal range and within 180 days    Sodium  Date Value Ref Range Status  06/10/2023 142 134 - 144 mmol/L Final         Passed - Cr in normal range and within 180 days    Creat  Date Value Ref Range Status  07/30/2016 1.20 0.70 - 1.25 mg/dL Final    Comment:      For patients > or = 70 years of age: The upper reference limit for Creatinine is approximately 13% higher for people identified as African-American.      Creatinine, Ser  Date Value Ref Range Status  06/10/2023 0.89 0.76 - 1.27 mg/dL Final         Passed - Cl in normal range and within 180 days    Chloride  Date Value Ref Range Status  06/10/2023 100 96 - 106 mmol/L Final         Passed - Mg Level in normal range and within 180 days    Magnesium  Date Value Ref Range Status  05/19/2023 2.2 1.7 - 2.4 mg/dL Final    Comment:    Performed at [facility], [address], [place], [place] [address]         Passed - Last BP in normal range    BP Readings  from Last 1 Encounters:  07/29/23 (!) 103/53         Passed - Valid encounter within last 6 months    Recent Outpatient Visits           1 month ago Encounter for screening involving social determinants of health (SDoH)   [place] [facility] [place] - [facility] Dept Of [place]. [facility] [name], MD   5 months ago Iron deficiency anemia due to chronic blood loss   [place] [facility] [place] - A Dept Of [place]. [facility] [name], MD   6 months ago Iron deficiency anemia due to chronic blood loss   [place] [facility] [place] - A Dept Of [place]. [facility] [name], MD   9 months ago Type 2 diabetes mellitus with other specified complication, without long-term current use of insulin (HCC)   [place] [facility] [place] - A Dept Of [place]. [facility] [name], MD   1 year ago Bilateral impacted cerumen   [place] [facility] [place] - A Dept Of [place]. [facility] [name], MD       Future Appointments             In 1 month [name], MD [facility] [place] - A Dept Of [place]. [facility]            Signed Prescriptions Disp Refills   glucose blood test strip 100 each 6    Sig: Use to check blood sugar once daily.     Endocrinology: Diabetes - Testing Supplies Passed - 08/01/2023  4:08 PM      Passed - Valid encounter within last 12 months    Recent Outpatient Visits           1 month ago Encounter for screening involving social determinants of health (SDoH)   [place] [facility] [place] - A Dept Of [place]. [facility] [name], MD   5 months ago Iron deficiency anemia due to chronic blood loss   [place] [facility] [place] [facility] - A Dept Of [place]. [facility] [name], MD   6 months ago Iron deficiency anemia due to chronic blood loss   [place] [facility] [place] - A Dept Of [place].  [facility] [name], MD   9 months ago Type 2 diabetes mellitus with other specified complication, without long-term current use of insulin (HCC)   [place] [facility] [place] - A Dept Of [place]. [facility] [name], MD   1 year ago Bilateral impacted cerumen   [place] [facility] [place] - A Dept Of [place]. [facility] [name], MD       Future Appointments             In 1 month [name], MD [facility] [place] - A Dept Of . [facility]               Requested Prescriptions  Pending Prescriptions Disp Refills   furosemide (LASIX) 40 MG tablet 60 tablet 2    Sig: Take 1 tablet (40 mg total) by mouth 2 (two) times daily.     Cardiovascular:  Diuretics - Loop Failed - 08/01/2023  4:08 PM      Failed - K in normal range and within 180 days    Potassium  Date Value Ref Range Status  06/10/2023 3.4 (L) 3.5 - 5.2 mmol/L Final         Passed - Ca in normal range and within 180 days    Calcium  Date Value Ref Range Status  06/10/2023 9.4 8.6 - 10.2 mg/dL Final   Calcium, Ion  Date Value Ref Range Status  12/27/2022 1.16 1.15 - 1.40 mmol/L Final         Passed - Na in normal range and within 180 days    Sodium  Date Value Ref Range Status  06/10/2023 142 134 - 144 mmol/L Final         Passed - Cr in normal range and within 180 days    Creat  Date Value Ref Range Status  07/30/2016 1.20 0.70 - 1.25 mg/dL Final    Comment:      For patients > or = 70 years of age: The upper reference limit for Creatinine is approximately  13% higher for people identified as African-American.      Creatinine, Ser  Date Value Ref Range Status  06/10/2023 0.89 0.76 - 1.27 mg/dL Final         Passed - Cl in normal range and within 180 days    Chloride  Date Value Ref Range Status  06/10/2023 100 96 - 106 mmol/L Final         Passed - Mg Level in normal range and within 180  days    Magnesium  Date Value Ref Range Status  05/19/2023 2.2 1.7 - 2.4 mg/dL Final    Comment:    Performed at [facility], [address], [place], [place] [address]         Passed - Last BP in normal range    BP Readings from Last 1 Encounters:  07/29/23 (!) 103/53         Passed - Valid encounter within last 6 months    Recent Outpatient Visits           1 month ago Encounter for screening involving social determinants of health (SDoH)   [place] [facility] [place] - A Dept Of [place]. [facility] [name], MD   5 months ago Iron deficiency anemia due to chronic blood loss   [place] [facility] [place] - A Dept Of [place]. [facility] [name], MD   6 months ago Iron deficiency anemia due to chronic blood loss   [place] [facility] [place] - A Dept Of [place]. [facility] [name], MD   9 months ago Type 2 diabetes mellitus with other specified complication, without long-term current use of insulin (HCC)   [place] [facility] [place] - A Dept Of [place]. [facility] [name], MD   1 year ago Bilateral impacted cerumen   [place] [facility] [place] - A Dept Of [place]. [facility] [name], MD       Future Appointments             In 1 month [name], MD [facility] [place] - A Dept Of [place]. [facility]            Signed Prescriptions Disp Refills   glucose blood test strip 100 each 6    Sig: Use to check blood sugar once daily.     Endocrinology: Diabetes - Testing Supplies Passed - 08/01/2023  4:08 PM      Passed - Valid encounter within last 12 months    Recent Outpatient Visits           1 month ago Encounter for screening involving social determinants of health (SDoH)   [place] [facility] [place] - A Dept Of [place]. [facility] [name], MD   5 months ago Iron deficiency anemia due to  chronic blood loss   [place] [facility] [place] - A Dept Of [place]. [facility] [name], MD   6 months ago Iron deficiency anemia due to chronic blood loss   [place] [facility] [place] - A Dept Of [place]. [facility] [name], MD   9 months ago Type 2 diabetes mellitus with other specified complication, without long-term current use of insulin (HCC)    Comm [facility] [place] - A Dept Of . [facility] [name], MD   1  year ago Bilateral impacted cerumen   [place] [facility] - A Dept Of [place]. [facility] [name], MD       Future Appointments             In 1 month [name], MD [facility] [place] - A Dept Of [place]. [facility]

## 2023-08-01 NOTE — Telephone Encounter (Signed)
 Requested Prescriptions  Pending Prescriptions Disp Refills   furosemide (LASIX) 40 MG tablet 60 tablet 2    Sig: Take 1 tablet (40 mg total) by mouth 2 (two) times daily.     Cardiovascular:  Diuretics - Loop Failed - 08/01/2023  4:07 PM      Failed - K in normal range and within 180 days    Potassium  Date Value Ref Range Status  06/10/2023 3.4 (L) 3.5 - 5.2 mmol/L Final         Passed - Ca in normal range and within 180 days    Calcium  Date Value Ref Range Status  06/10/2023 9.4 8.6 - 10.2 mg/dL Final   Calcium, Ion  Date Value Ref Range Status  12/27/2022 1.16 1.15 - 1.40 mmol/L Final         Passed - Na in normal range and within 180 days    Sodium  Date Value Ref Range Status  06/10/2023 142 134 - 144 mmol/L Final         Passed - Cr in normal range and within 180 days    Creat  Date Value Ref Range Status  07/30/2016 1.20 0.70 - 1.25 mg/dL Final    Comment:      For patients > or = 70 years of age: The upper reference limit for Creatinine is approximately 13% higher for people identified as African-American.      Creatinine, Ser  Date Value Ref Range Status  06/10/2023 0.89 0.76 - 1.27 mg/dL Final         Passed - Cl in normal range and within 180 days    Chloride  Date Value Ref Range Status  06/10/2023 100 96 - 106 mmol/L Final         Passed - Mg Level in normal range and within 180 days    Magnesium  Date Value Ref Range Status  05/19/2023 2.2 1.7 - 2.4 mg/dL Final    Comment:    Performed at [facility], [address] [place], [place] [address]         Passed - Last BP in normal range    BP Readings from Last 1 Encounters:  07/29/23 (!) 103/53         Passed - Valid encounter within last 6 months    Recent Outpatient Visits           1 month ago Encounter for screening involving social determinants of health (SDoH)   [place] [facility] [place] - A Dept Of [place]. [facility] [name], MD   5 months ago  Iron deficiency anemia due to chronic blood loss   [place] [facility] [place] - A Dept Of [place]. [facility] [name], MD   6 months ago Iron deficiency anemia due to chronic blood loss   [place] [facility] [place] - A Dept Of [place]. [facility] [name], MD   9 months ago Type 2 diabetes mellitus with other specified complication, without long-term current use of insulin (HCC)   [place] [facility] [place] - A Dept Of [place]. [facility] [name], MD   1 year ago Bilateral impacted cerumen   [employer] [facility] [place] - A Dept Of [place]. [facility] [name], MD       Future Appointments             In 1 month  [name], MD [facility] [place] - A Dept Of [place]. [facility]             glucose blood test strip 100 each 6    Sig: Use to check blood sugar once daily.     Endocrinology: Diabetes - Testing Supplies Passed - 08/01/2023  4:07 PM      Passed - Valid encounter within last 12 months    Recent Outpatient Visits           1 month ago Encounter for screening involving social determinants of health (SDoH)   [place] [facility] [place] - A Dept Of [place]. [facility] [name], MD   5 months ago Iron deficiency anemia due to chronic blood loss   [place] [facility] [place] - A Dept Of [place]. [facility] [name], MD   6 months ago Iron deficiency anemia due to chronic blood loss   [place] [facility] [place] - A Dept Of [place]. [facility] [name], MD   9 months ago Type 2 diabetes mellitus with other specified complication, without long-term current use of insulin (HCC)   [place] [facility] [place] - A Dept Of [place]. [facility] [name], MD   1 year ago Bilateral impacted cerumen   [place] [facility] [place] - A Dept Of [place]. [facility] [name], MD       Future Appointments             In 1 month [name], MD [facility] [place] - A Dept Of [place]. [facility]

## 2023-08-02 ENCOUNTER — Other Ambulatory Visit: Payer: Self-pay

## 2023-08-02 ENCOUNTER — Ambulatory Visit (HOSPITAL_COMMUNITY)
Admission: RE | Admit: 2023-08-02 | Discharge: 2023-08-02 | Disposition: A | Discharge status: Home / Self Care | Payer: Medicare HMO | Attending: Vascular Surgery | Admitting: Vascular Surgery

## 2023-08-02 ENCOUNTER — Encounter (HOSPITAL_COMMUNITY): Payer: Self-pay | Admitting: Vascular Surgery

## 2023-08-02 ENCOUNTER — Encounter (HOSPITAL_COMMUNITY)
Admission: RE | Disposition: A | Discharge status: Home / Self Care | Payer: Self-pay | Source: Home / Self Care | Attending: Vascular Surgery

## 2023-08-02 DIAGNOSIS — Z95828 Presence of other vascular implants and grafts: Secondary | ICD-10-CM | POA: Diagnosis not present

## 2023-08-02 DIAGNOSIS — F17210 Nicotine dependence, cigarettes, uncomplicated: Secondary | ICD-10-CM | POA: Insufficient documentation

## 2023-08-02 DIAGNOSIS — I5042 Chronic combined systolic (congestive) and diastolic (congestive) heart failure: Secondary | ICD-10-CM | POA: Insufficient documentation

## 2023-08-02 DIAGNOSIS — Z7901 Long term (current) use of anticoagulants: Secondary | ICD-10-CM | POA: Insufficient documentation

## 2023-08-02 DIAGNOSIS — Z8249 Family history of ischemic heart disease and other diseases of the circulatory system: Secondary | ICD-10-CM | POA: Insufficient documentation

## 2023-08-02 DIAGNOSIS — Z9981 Dependence on supplemental oxygen: Secondary | ICD-10-CM | POA: Insufficient documentation

## 2023-08-02 DIAGNOSIS — E1151 Type 2 diabetes mellitus with diabetic peripheral angiopathy without gangrene: Secondary | ICD-10-CM | POA: Diagnosis not present

## 2023-08-02 DIAGNOSIS — Z7982 Long term (current) use of aspirin: Secondary | ICD-10-CM | POA: Diagnosis not present

## 2023-08-02 DIAGNOSIS — Z833 Family history of diabetes mellitus: Secondary | ICD-10-CM | POA: Diagnosis not present

## 2023-08-02 DIAGNOSIS — I110 Hypertensive heart disease with heart failure: Secondary | ICD-10-CM | POA: Insufficient documentation

## 2023-08-02 DIAGNOSIS — I70222 Atherosclerosis of native arteries of extremities with rest pain, left leg: Secondary | ICD-10-CM | POA: Insufficient documentation

## 2023-08-02 DIAGNOSIS — Z79899 Other long term (current) drug therapy: Secondary | ICD-10-CM | POA: Insufficient documentation

## 2023-08-02 HISTORY — PX: PERIPHERAL VASCULAR BALLOON ANGIOPLASTY: CATH118281

## 2023-08-02 HISTORY — PX: PERIPHERAL VASCULAR INTERVENTION: CATH118257

## 2023-08-02 HISTORY — PX: ABDOMINAL AORTOGRAM W/LOWER EXTREMITY: CATH118223

## 2023-08-02 LAB — POCT I-STAT, CHEM 8
BUN: 11 mg/dL (ref 8–23)
Calcium, Ion: 1.15 mmol/L (ref 1.15–1.40)
Chloride: 97 mmol/L — ABNORMAL LOW (ref 98–111)
Creatinine, Ser: 0.9 mg/dL (ref 0.61–1.24)
Glucose, Bld: 113 mg/dL — ABNORMAL HIGH (ref 70–99)
HCT: 34 % — ABNORMAL LOW (ref 39.0–52.0)
Hemoglobin: 11.6 g/dL — ABNORMAL LOW (ref 13.0–17.0)
Potassium: 4.9 mmol/L (ref 3.5–5.1)
Sodium: 140 mmol/L (ref 135–145)
TCO2: 40 mmol/L — ABNORMAL HIGH (ref 22–32)

## 2023-08-02 LAB — GLUCOSE, CAPILLARY: Glucose-Capillary: 117 mg/dL — ABNORMAL HIGH (ref 70–99)

## 2023-08-02 SURGERY — ABDOMINAL AORTOGRAM W/LOWER EXTREMITY
Anesthesia: LOCAL | Laterality: Left

## 2023-08-02 MED ORDER — HEPARIN SODIUM (PORCINE) 1000 UNIT/ML IJ SOLN
INTRAMUSCULAR | Status: AC
  Filled 2023-08-02: qty 10

## 2023-08-02 MED ORDER — ONDANSETRON HCL 4 MG/2ML IJ SOLN: 4.0000 mg | Freq: Four times a day (QID) | INTRAMUSCULAR | Status: DC | PRN

## 2023-08-02 MED ORDER — FUROSEMIDE 40 MG PO TABS
40.0000 mg | ORAL_TABLET | Freq: Two times a day (BID) | ORAL | 2 refills | Status: DC
  Filled 2023-08-02: qty 60, 30d supply, fill #0
  Filled 2023-08-27: qty 60, 30d supply, fill #1

## 2023-08-02 MED ORDER — ASPIRIN 81 MG PO TBEC: 81.0000 mg | DELAYED_RELEASE_TABLET | Freq: Every day | ORAL | Status: DC

## 2023-08-02 MED ORDER — SODIUM CHLORIDE 0.9 % IV SOLN: INTRAVENOUS | Status: DC

## 2023-08-02 MED ORDER — MIDAZOLAM HCL 2 MG/2ML IJ SOLN
INTRAMUSCULAR | Status: AC
  Filled 2023-08-02: qty 2

## 2023-08-02 MED ORDER — LIDOCAINE HCL (PF) 1 % IJ SOLN
INTRAMUSCULAR | Status: DC | PRN
  Administered 2023-08-02: 15 mL

## 2023-08-02 MED ORDER — SODIUM CHLORIDE 0.9% FLUSH: 3.0000 mL | INTRAVENOUS | Status: DC | PRN

## 2023-08-02 MED ORDER — ATORVASTATIN CALCIUM 40 MG PO TABS
40.0000 mg | ORAL_TABLET | Freq: Every day | ORAL | Status: DC
Start: 2023-08-02 — End: 2023-08-02

## 2023-08-02 MED ORDER — SODIUM CHLORIDE 0.9 % IV SOLN: 250.0000 mL | INTRAVENOUS | Status: DC | PRN

## 2023-08-02 MED ORDER — FENTANYL CITRATE (PF) 100 MCG/2ML IJ SOLN
INTRAMUSCULAR | Status: DC | PRN
  Administered 2023-08-02: 50 ug via INTRAVENOUS
  Administered 2023-08-02 (×2): 25 ug via INTRAVENOUS
  Administered 2023-08-02: 50 ug via INTRAVENOUS

## 2023-08-02 MED ORDER — HEPARIN (PORCINE) IN NACL 1000-0.9 UT/500ML-% IV SOLN
INTRAVENOUS | Status: DC | PRN
  Administered 2023-08-02 (×2): 500 mL

## 2023-08-02 MED ORDER — HYDRALAZINE HCL 20 MG/ML IJ SOLN: 5.0000 mg | INTRAMUSCULAR | Status: DC | PRN

## 2023-08-02 MED ORDER — SODIUM CHLORIDE 0.9% FLUSH: 3.0000 mL | Freq: Two times a day (BID) | INTRAVENOUS | Status: DC

## 2023-08-02 MED ORDER — LIDOCAINE HCL (PF) 1 % IJ SOLN
INTRAMUSCULAR | Status: AC
  Filled 2023-08-02: qty 30

## 2023-08-02 MED ORDER — CLOPIDOGREL BISULFATE 75 MG PO TABS
300.0000 mg | ORAL_TABLET | Freq: Once | ORAL | Status: AC
  Administered 2023-08-02: 300 mg via ORAL
  Filled 2023-08-02: qty 4

## 2023-08-02 MED ORDER — MIDAZOLAM HCL 2 MG/2ML IJ SOLN
INTRAMUSCULAR | Status: DC | PRN
  Administered 2023-08-02 (×2): 1 mg via INTRAVENOUS

## 2023-08-02 MED ORDER — FENTANYL CITRATE (PF) 100 MCG/2ML IJ SOLN
INTRAMUSCULAR | Status: AC
  Filled 2023-08-02: qty 2

## 2023-08-02 MED ORDER — CLOPIDOGREL BISULFATE 75 MG PO TABS: 75.0000 mg | ORAL_TABLET | Freq: Every day | ORAL | Status: DC

## 2023-08-02 MED ORDER — ACETAMINOPHEN 325 MG PO TABS: 650.0000 mg | ORAL_TABLET | ORAL | Status: DC | PRN

## 2023-08-02 MED ORDER — HEPARIN SODIUM (PORCINE) 1000 UNIT/ML IJ SOLN
INTRAMUSCULAR | Status: DC | PRN
  Administered 2023-08-02: 10000 [IU] via INTRAVENOUS

## 2023-08-02 MED ORDER — LABETALOL HCL 5 MG/ML IV SOLN: 10.0000 mg | INTRAVENOUS | Status: DC | PRN

## 2023-08-02 MED ORDER — SODIUM CHLORIDE 0.9 % WEIGHT BASED INFUSION: 1.0000 mL/kg/h | INTRAVENOUS | Status: DC

## 2023-08-02 MED ORDER — IODIXANOL 320 MG/ML IV SOLN
INTRAVENOUS | Status: DC | PRN
  Administered 2023-08-02: 85 mL

## 2023-08-02 MED ORDER — CLOPIDOGREL BISULFATE 75 MG PO TABS
75.0000 mg | ORAL_TABLET | Freq: Every day | ORAL | 11 refills | Status: DC
  Filled 2023-08-02: qty 30, 30d supply, fill #0

## 2023-08-02 SURGICAL SUPPLY — 27 items
BALLN ADMIRAL INPACT 5X250 (BALLOONS) ×1 IMPLANT
BALLN MUSTANG 4X60X135 (BALLOONS) ×1 IMPLANT
BALLN STERLING OTW 3X220X150 (BALLOONS) ×1 IMPLANT
BALLN STERLING OTW 5X100X135 (BALLOONS) ×1 IMPLANT
BALLN STERLING OTW 5X220X150 (BALLOONS) ×1 IMPLANT
BALLOON ADMIRAL INPACT 5X250 (BALLOONS) IMPLANT
BALLOON MUSTANG 4X60X135 (BALLOONS) IMPLANT
BALLOON STERLING OTW 3X220X150 (BALLOONS) IMPLANT
BALLOON STERLING OTW 5X100X135 (BALLOONS) IMPLANT
BALLOON STERLING OTW 5X220X150 (BALLOONS) IMPLANT
CATH OMNI FLUSH 5F 65CM (CATHETERS) IMPLANT
CATH QUICKCROSS SUPP .035X90CM (MICROCATHETER) IMPLANT
CLOSURE PERCLOSE PROSTYLE (VASCULAR PRODUCTS) IMPLANT
COVER DOME SNAP 22 D (MISCELLANEOUS) IMPLANT
GLIDEWIRE ADV .035X260CM (WIRE) IMPLANT
KIT ENCORE 26 ADVANTAGE (KITS) IMPLANT
KIT MICROPUNCTURE NIT STIFF (SHEATH) IMPLANT
KIT SYRINGE INJ CVI SPIKEX1 (MISCELLANEOUS) IMPLANT
SET ATX-X65L (MISCELLANEOUS) IMPLANT
SHEATH CATAPULT 6F 90 MP (SHEATH) IMPLANT
SHEATH CATAPULT 6FR 45 (SHEATH) IMPLANT
SHEATH PINNACLE 5F 10CM (SHEATH) IMPLANT
SHEATH PROBE COVER 6X72 (BAG) IMPLANT
STENT VIABAHN 6X150X120 (Permanent Stent) IMPLANT
TRAY PV CATH (CUSTOM PROCEDURE TRAY) ×1 IMPLANT
WIRE BENTSON .035X145CM (WIRE) IMPLANT
WIRE G V18X300CM (WIRE) IMPLANT

## 2023-08-02 NOTE — Progress Notes (Signed)
 Patient and daughter was given discharge instructions. Both verbalized understanding.

## 2023-08-02 NOTE — Op Note (Signed)
 DATE OF SERVICE: 08/02/2023  PATIENT:  Jared [name]  68 y.o. 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 Lenell Antu, MD  ASSISTANT: none  ANESTHESIA:   local and IV sedation  ESTIMATED BLOOD LOSS: minimal  LOCAL MEDICATIONS USED:  LIDOCAINE   COUNTS: confirmed correct.  PATIENT DISPOSITION:  PACU - hemodynamically stable.   Delay start of Pharmacological VTE agent (>24hrs) due to surgical blood loss or risk of bleeding: no  INDICATION FOR PROCEDURE: Jared [name] is a 68 y.o. male with left leg ischemic rest pain. ABI and duplex show drop in ankle pressure and duplex evidence of occluded femoropopliteal stenting. After careful discussion of risks, benefits, and alternatives the patient was offered angiography. The patient understood and wished to proceed.  OPERATIVE FINDINGS:   Left renal artery: not seen Right renal artery: not seen  Infrarenal aorta: patent  Left common iliac artery: patent Right common iliac artery: patent  Left internal iliac artery: occluded Right internal iliac artery: patent  Left external iliac artery: patent Right external iliac artery: patent  Left common femoral artery: patent Right common femoral artery: patent  Left profunda femoris artery: patent - no evidence of stenosis as suggested by duplex. ? Overiding stent causing issue identified on duplex Right profunda femoris artery: not studied  Left superficial femoral artery: occluded; an area of stent had collapsed on itself causing functional stenosis in the  midsection. Right superficial femoral artery: not studied  Left popliteal artery: patent Right popliteal artery: not studied  Left anterior tibial artery: patent  Right anterior tibial artery: not studied  Left tibioperoneal trunk: patent Right tibioperoneal trunk: not studied  Left peroneal artery: patent Right peroneal artery: not studied  Left posterior tibial artery: occludes mid calf Right posterior tibial artery: not studied  Left pedal circulation: disadvantaged Right pedal circulation: not studied   GLASS score. FP 4. IP 0. Stage III.   WIfI score. N/a no wound  DESCRIPTION OF PROCEDURE: After identification of the patient in the pre-operative holding area, the patient was transferred to the operating room. The patient was positioned supine on the operating room table. Anesthesia was induced. The groins was prepped and draped in standard fashion. A surgical pause was performed confirming correct patient, procedure, and operative location.  The right groin was anesthetized with subcutaneous injection of 1% lidocaine. Using ultrasound guidance, the right common femoral artery was accessed with micropuncture technique. Fluoroscopy was used to confirm cannulation over the femoral head.  The 3F micropuncture sheath was upsized to 56F.   A Benson wire was advanced into the distal aorta. Over the wire an omni flush catheter was advanced to the level of L2. Aortogram was performed - see above for details.   The left common iliac artery was selected with an omniflush catheter and glidwire advantage guidewire. The wire was advanced into the common femoral artery. Over the wire the omni flush catheter was advanced into the external iliac artery. Selective angiography was performed - see above for details.   The decision was made to intervene. The patient was heparinized with 10,000 units of heparin. The 56F sheath was exchanged for  a 18F x 90cm sheath. Selective angiography of the left lower extremity  was performed prior to intervention. A confirmatory angiogram was performed in the popliteal artery after crossing the lesion.   As stent malfunction was identified in the mid SFA.  The stent appeared to prolapse on itself causing a functional stenosis.  I was able to cross the lesion and angioplasty it to allow access across the lesion.  I relined this area with a covered Viabahn stent.  Much improved flow was noted after this maneuver.  Completion angiogram showed resolution of the occlusion and inline flow to the foot.  A perclose was used to close the arteriotomy. Hemostasis was excellent upon completion.   Conscious sedation was administered with the use of IV fentanyl and midazolam under continuous physician and nurse monitoring.  Heart rate, blood pressure, and oxygen saturation were continuously monitored.  Total sedation time was 70 minutes  Upon completion of the case instrument and sharps counts were confirmed correct. The patient was transferred to the PACU in good condition. I was present for all portions of the procedure.  PLAN: ASA / Plavix / Eliquis / Statin. Follow up with me in 4 weeks with ABI and duplex.  Rande Brunt. Lenell Antu, MD [facility] Phone Number: [phone] 08/02/2023 10:58 AM

## 2023-08-02 NOTE — Interval H&P Note (Signed)
 History and Physical Interval Note:  08/02/2023 10:58 AM  Jared [name]  has presented today for surgery, with the diagnosis of ischemia.  The various methods of treatment have been discussed with the patient and family. After consideration of risks, benefits and other options for treatment, the patient has consented to  Procedure(s): ABDOMINAL AORTOGRAM W/LOWER EXTREMITY (Left) PERIPHERAL VASCULAR BALLOON ANGIOPLASTY (Left) PERIPHERAL VASCULAR INTERVENTION (Left) as a surgical intervention.  The patient's history has been reviewed, patient examined, no change in status, stable for surgery.  I have reviewed the patient's chart and labs.  Questions were answered to the patient's satisfaction.     Leonie Douglas

## 2023-08-05 ENCOUNTER — Ambulatory Visit (INDEPENDENT_AMBULATORY_CARE_PROVIDER_SITE_OTHER): Payer: Medicare HMO

## 2023-08-05 VITALS — BP 123/76 | HR 111 | Temp 98.5°F | Resp 20 | Ht 75.0 in | Wt 201.8 lb

## 2023-08-05 DIAGNOSIS — D509 Iron deficiency anemia, unspecified: Secondary | ICD-10-CM

## 2023-08-05 DIAGNOSIS — D500 Iron deficiency anemia secondary to blood loss (chronic): Secondary | ICD-10-CM

## 2023-08-05 MED ORDER — IRON SUCROSE 20 MG/ML IV SOLN
200.0000 mg | Freq: Once | INTRAVENOUS | Status: AC
  Administered 2023-08-05: 200 mg via INTRAVENOUS
  Filled 2023-08-05: qty 10

## 2023-08-05 MED ORDER — ACETAMINOPHEN 325 MG PO TABS
650.0000 mg | ORAL_TABLET | Freq: Once | ORAL | Status: DC
Start: 2023-08-05 — End: 2023-08-05

## 2023-08-05 MED ORDER — DIPHENHYDRAMINE HCL 25 MG PO CAPS: 25.0000 mg | ORAL_CAPSULE | Freq: Once | ORAL | Status: DC

## 2023-08-05 NOTE — Progress Notes (Signed)
 Diagnosis: Acute Anemia  Provider:  Chilton Greathouse MD  Procedure: IV Push  IV Type: Peripheral, IV Location: R Hand  Venofer (Iron Sucrose), Dose: 200 mg  Post Infusion IV Care: Patient declined observation and Peripheral IV Discontinued  Discharge: Condition: Good, Destination: Home . AVS Declined  Performed by:  Nat Math, RN

## 2023-08-15 ENCOUNTER — Other Ambulatory Visit: Payer: Self-pay | Admitting: Internal Medicine

## 2023-08-15 ENCOUNTER — Other Ambulatory Visit: Payer: Self-pay

## 2023-08-15 MED ORDER — BREZTRI AEROSPHERE 160-9-4.8 MCG/ACT IN AERO
INHALATION_SPRAY | RESPIRATORY_TRACT | 11 refills | Status: AC
  Filled 2023-08-15: qty 10.7, 30d supply, fill #0
  Filled 2023-09-19: qty 10.7, 30d supply, fill #1
  Filled 2023-10-14: qty 10.7, 30d supply, fill #2
  Filled 2023-11-13 (×2): qty 10.7, 30d supply, fill #3
  Filled 2023-12-30: qty 10.7, 30d supply, fill #4
  Filled 2024-02-13: qty 10.7, 30d supply, fill #5
  Filled 2024-03-30: qty 10.7, 30d supply, fill #6
  Filled 2024-05-13: qty 10.7, 30d supply, fill #7
  Filled 2024-06-15: qty 10.7, 30d supply, fill #8
  Filled 2024-07-13: qty 10.7, 30d supply, fill #9

## 2023-08-22 ENCOUNTER — Other Ambulatory Visit: Payer: Self-pay

## 2023-08-22 ENCOUNTER — Encounter (HOSPITAL_COMMUNITY): Payer: Self-pay | Admitting: Emergency Medicine

## 2023-08-22 ENCOUNTER — Inpatient Hospital Stay (HOSPITAL_COMMUNITY)
Admission: EM | Admit: 2023-08-22 | Discharge: 2023-08-26 | DRG: 378 | Disposition: A | Discharge status: Home Health | Attending: Family Medicine | Admitting: Family Medicine

## 2023-08-22 ENCOUNTER — Emergency Department (HOSPITAL_COMMUNITY)

## 2023-08-22 DIAGNOSIS — K219 Gastro-esophageal reflux disease without esophagitis: Secondary | ICD-10-CM | POA: Diagnosis present

## 2023-08-22 DIAGNOSIS — D509 Iron deficiency anemia, unspecified: Secondary | ICD-10-CM | POA: Diagnosis present

## 2023-08-22 DIAGNOSIS — I480 Paroxysmal atrial fibrillation: Secondary | ICD-10-CM | POA: Diagnosis present

## 2023-08-22 DIAGNOSIS — I2510 Atherosclerotic heart disease of native coronary artery without angina pectoris: Secondary | ICD-10-CM | POA: Diagnosis not present

## 2023-08-22 DIAGNOSIS — E119 Type 2 diabetes mellitus without complications: Secondary | ICD-10-CM

## 2023-08-22 DIAGNOSIS — I509 Heart failure, unspecified: Secondary | ICD-10-CM | POA: Diagnosis not present

## 2023-08-22 DIAGNOSIS — N179 Acute kidney failure, unspecified: Secondary | ICD-10-CM | POA: Diagnosis present

## 2023-08-22 DIAGNOSIS — Z7902 Long term (current) use of antithrombotics/antiplatelets: Secondary | ICD-10-CM

## 2023-08-22 DIAGNOSIS — F17210 Nicotine dependence, cigarettes, uncomplicated: Secondary | ICD-10-CM | POA: Diagnosis present

## 2023-08-22 DIAGNOSIS — Z7901 Long term (current) use of anticoagulants: Secondary | ICD-10-CM

## 2023-08-22 DIAGNOSIS — I4892 Unspecified atrial flutter: Secondary | ICD-10-CM | POA: Diagnosis present

## 2023-08-22 DIAGNOSIS — E1165 Type 2 diabetes mellitus with hyperglycemia: Secondary | ICD-10-CM | POA: Diagnosis present

## 2023-08-22 DIAGNOSIS — Z789 Other specified health status: Secondary | ICD-10-CM | POA: Diagnosis not present

## 2023-08-22 DIAGNOSIS — D62 Acute posthemorrhagic anemia: Secondary | ICD-10-CM | POA: Diagnosis present

## 2023-08-22 DIAGNOSIS — Z9981 Dependence on supplemental oxygen: Secondary | ICD-10-CM

## 2023-08-22 DIAGNOSIS — I428 Other cardiomyopathies: Secondary | ICD-10-CM | POA: Diagnosis present

## 2023-08-22 DIAGNOSIS — Z7982 Long term (current) use of aspirin: Secondary | ICD-10-CM

## 2023-08-22 DIAGNOSIS — E1169 Type 2 diabetes mellitus with other specified complication: Secondary | ICD-10-CM | POA: Diagnosis not present

## 2023-08-22 DIAGNOSIS — K3533 Acute appendicitis with perforation and localized peritonitis, with abscess: Secondary | ICD-10-CM | POA: Diagnosis not present

## 2023-08-22 DIAGNOSIS — E785 Hyperlipidemia, unspecified: Secondary | ICD-10-CM | POA: Diagnosis present

## 2023-08-22 DIAGNOSIS — Z833 Family history of diabetes mellitus: Secondary | ICD-10-CM

## 2023-08-22 DIAGNOSIS — Z800 Family history of malignant neoplasm of digestive organs: Secondary | ICD-10-CM

## 2023-08-22 DIAGNOSIS — Z8249 Family history of ischemic heart disease and other diseases of the circulatory system: Secondary | ICD-10-CM

## 2023-08-22 DIAGNOSIS — I472 Ventricular tachycardia, unspecified: Secondary | ICD-10-CM | POA: Diagnosis present

## 2023-08-22 DIAGNOSIS — D75839 Thrombocytosis, unspecified: Secondary | ICD-10-CM | POA: Diagnosis not present

## 2023-08-22 DIAGNOSIS — I110 Hypertensive heart disease with heart failure: Secondary | ICD-10-CM | POA: Diagnosis present

## 2023-08-22 DIAGNOSIS — D75838 Other thrombocytosis: Secondary | ICD-10-CM | POA: Diagnosis present

## 2023-08-22 DIAGNOSIS — I5042 Chronic combined systolic (congestive) and diastolic (congestive) heart failure: Secondary | ICD-10-CM | POA: Diagnosis present

## 2023-08-22 DIAGNOSIS — R109 Unspecified abdominal pain: Secondary | ICD-10-CM | POA: Diagnosis not present

## 2023-08-22 DIAGNOSIS — J449 Chronic obstructive pulmonary disease, unspecified: Secondary | ICD-10-CM | POA: Diagnosis present

## 2023-08-22 DIAGNOSIS — K2289 Other specified disease of esophagus: Secondary | ICD-10-CM | POA: Diagnosis present

## 2023-08-22 DIAGNOSIS — I739 Peripheral vascular disease, unspecified: Secondary | ICD-10-CM | POA: Diagnosis present

## 2023-08-22 DIAGNOSIS — I700 Atherosclerosis of aorta: Secondary | ICD-10-CM | POA: Diagnosis not present

## 2023-08-22 DIAGNOSIS — G8929 Other chronic pain: Secondary | ICD-10-CM | POA: Diagnosis present

## 2023-08-22 DIAGNOSIS — I5032 Chronic diastolic (congestive) heart failure: Secondary | ICD-10-CM | POA: Diagnosis present

## 2023-08-22 DIAGNOSIS — K31819 Angiodysplasia of stomach and duodenum without bleeding: Secondary | ICD-10-CM | POA: Diagnosis not present

## 2023-08-22 DIAGNOSIS — K31811 Angiodysplasia of stomach and duodenum with bleeding: Secondary | ICD-10-CM | POA: Diagnosis not present

## 2023-08-22 DIAGNOSIS — Z8673 Personal history of transient ischemic attack (TIA), and cerebral infarction without residual deficits: Secondary | ICD-10-CM | POA: Diagnosis not present

## 2023-08-22 DIAGNOSIS — Z8719 Personal history of other diseases of the digestive system: Secondary | ICD-10-CM | POA: Diagnosis not present

## 2023-08-22 DIAGNOSIS — Z955 Presence of coronary angioplasty implant and graft: Secondary | ICD-10-CM

## 2023-08-22 DIAGNOSIS — D500 Iron deficiency anemia secondary to blood loss (chronic): Secondary | ICD-10-CM | POA: Diagnosis not present

## 2023-08-22 DIAGNOSIS — J9611 Chronic respiratory failure with hypoxia: Secondary | ICD-10-CM | POA: Diagnosis present

## 2023-08-22 DIAGNOSIS — D649 Anemia, unspecified: Principal | ICD-10-CM | POA: Diagnosis present

## 2023-08-22 DIAGNOSIS — Z79899 Other long term (current) drug therapy: Secondary | ICD-10-CM | POA: Diagnosis not present

## 2023-08-22 DIAGNOSIS — K2270 Barrett's esophagus without dysplasia: Secondary | ICD-10-CM | POA: Diagnosis present

## 2023-08-22 DIAGNOSIS — E1151 Type 2 diabetes mellitus with diabetic peripheral angiopathy without gangrene: Secondary | ICD-10-CM | POA: Diagnosis present

## 2023-08-22 DIAGNOSIS — I10 Essential (primary) hypertension: Secondary | ICD-10-CM | POA: Diagnosis present

## 2023-08-22 DIAGNOSIS — M79605 Pain in left leg: Secondary | ICD-10-CM | POA: Insufficient documentation

## 2023-08-22 DIAGNOSIS — Z888 Allergy status to other drugs, medicaments and biological substances status: Secondary | ICD-10-CM

## 2023-08-22 DIAGNOSIS — K5520 Angiodysplasia of colon without hemorrhage: Secondary | ICD-10-CM | POA: Diagnosis not present

## 2023-08-22 DIAGNOSIS — N200 Calculus of kidney: Secondary | ICD-10-CM | POA: Diagnosis not present

## 2023-08-22 DIAGNOSIS — R0602 Shortness of breath: Secondary | ICD-10-CM | POA: Diagnosis not present

## 2023-08-22 LAB — COMPREHENSIVE METABOLIC PANEL
ALT: 22 U/L (ref 0–44)
AST: 19 U/L (ref 15–41)
Albumin: 3.2 g/dL — ABNORMAL LOW (ref 3.5–5.0)
Alkaline Phosphatase: 64 U/L (ref 38–126)
Anion gap: 12 (ref 5–15)
BUN: 11 mg/dL (ref 8–23)
CO2: 28 mmol/L (ref 22–32)
Calcium: 8.6 mg/dL — ABNORMAL LOW (ref 8.9–10.3)
Chloride: 97 mmol/L — ABNORMAL LOW (ref 98–111)
Creatinine, Ser: 1.04 mg/dL (ref 0.61–1.24)
GFR, Estimated: >60 mL/min (ref >60)
Glucose, Bld: 171 mg/dL — ABNORMAL HIGH (ref 70–99)
Potassium: 3.8 mmol/L (ref 3.5–5.1)
Sodium: 137 mmol/L (ref 135–145)
Total Bilirubin: 0.3 mg/dL (ref 0.0–1.2)
Total Protein: 6.5 g/dL (ref 6.5–8.1)

## 2023-08-22 LAB — RESP PANEL BY RT-PCR (RSV, FLU A&B, COVID)  RVPGX2
Influenza A by PCR: NEGATIVE
Influenza B by PCR: NEGATIVE
Resp Syncytial Virus by PCR: NEGATIVE
SARS Coronavirus 2 by RT PCR: NEGATIVE

## 2023-08-22 LAB — CBC
HCT: 21.8 % — ABNORMAL LOW (ref 39.0–52.0)
Hemoglobin: 6.1 g/dL — CL (ref 13.0–17.0)
MCH: 25.1 pg — ABNORMAL LOW (ref 26.0–34.0)
MCHC: 28 g/dL — ABNORMAL LOW (ref 30.0–36.0)
MCV: 89.7 fL (ref 80.0–100.0)
Platelets: 569 10*3/uL — ABNORMAL HIGH (ref 150–400)
RBC: 2.43 MIL/uL — ABNORMAL LOW (ref 4.22–5.81)
RDW: 17.2 % — ABNORMAL HIGH (ref 11.5–15.5)
WBC: 8 10*3/uL (ref 4.0–10.5)
nRBC: 0.5 % — ABNORMAL HIGH (ref 0.0–0.2)

## 2023-08-22 LAB — HEMOGLOBIN AND HEMATOCRIT, BLOOD
HCT: 21.9 % — ABNORMAL LOW (ref 39.0–52.0)
Hemoglobin: 6.3 g/dL — CL (ref 13.0–17.0)

## 2023-08-22 LAB — POC OCCULT BLOOD, ED
Comment 1: POSITIVE
Fecal Occult Bld: POSITIVE — AB

## 2023-08-22 LAB — GLUCOSE, CAPILLARY: Glucose-Capillary: 122 mg/dL — ABNORMAL HIGH (ref 70–99)

## 2023-08-22 LAB — PREPARE RBC (CROSSMATCH)

## 2023-08-22 LAB — BRAIN NATRIURETIC PEPTIDE: B Natriuretic Peptide: 100 pg/mL (ref 0.0–100.0)

## 2023-08-22 MED ORDER — BUDESON-GLYCOPYRROL-FORMOTEROL 160-9-4.8 MCG/ACT IN AERO: 2.0000 | INHALATION_SPRAY | Freq: Two times a day (BID) | RESPIRATORY_TRACT | Status: DC

## 2023-08-22 MED ORDER — DIPHENHYDRAMINE HCL 50 MG/ML IJ SOLN
25.0000 mg | Freq: Once | INTRAMUSCULAR | Status: AC
  Administered 2023-08-22: 25 mg via INTRAVENOUS
  Filled 2023-08-22: qty 1

## 2023-08-22 MED ORDER — METOPROLOL SUCCINATE ER 50 MG PO TB24: 50.0000 mg | ORAL_TABLET | Freq: Every day | ORAL | Status: DC

## 2023-08-22 MED ORDER — EZETIMIBE 10 MG PO TABS
10.0000 mg | ORAL_TABLET | Freq: Every day | ORAL | Status: DC
  Administered 2023-08-23 – 2023-08-26 (×4): 10 mg via ORAL
  Filled 2023-08-22 (×4): qty 1

## 2023-08-22 MED ORDER — DIPHENHYDRAMINE HCL 50 MG/ML IJ SOLN: 25.0000 mg | Freq: Once | INTRAMUSCULAR | Status: DC

## 2023-08-22 MED ORDER — SODIUM CHLORIDE 0.9% IV SOLUTION: Freq: Once | INTRAVENOUS | Status: DC

## 2023-08-22 MED ORDER — METOPROLOL TARTRATE 5 MG/5ML IV SOLN: 5.0000 mg | Freq: Once | INTRAVENOUS | Status: DC

## 2023-08-22 MED ORDER — UMECLIDINIUM BROMIDE 62.5 MCG/ACT IN AEPB
1.0000 | INHALATION_SPRAY | Freq: Every day | RESPIRATORY_TRACT | Status: DC
  Administered 2023-08-23 – 2023-08-26 (×4): 1 via RESPIRATORY_TRACT
  Filled 2023-08-22: qty 7

## 2023-08-22 MED ORDER — NICOTINE 14 MG/24HR TD PT24
14.0000 mg | MEDICATED_PATCH | Freq: Every day | TRANSDERMAL | Status: DC
  Administered 2023-08-22 – 2023-08-26 (×5): 14 mg via TRANSDERMAL
  Filled 2023-08-22 (×5): qty 1

## 2023-08-22 MED ORDER — PANTOPRAZOLE SODIUM 40 MG IV SOLR
40.0000 mg | Freq: Two times a day (BID) | INTRAVENOUS | Status: DC
  Administered 2023-08-22 – 2023-08-24 (×5): 40 mg via INTRAVENOUS
  Filled 2023-08-22 (×5): qty 10

## 2023-08-22 MED ORDER — FLUTICASONE FUROATE-VILANTEROL 100-25 MCG/ACT IN AEPB
1.0000 | INHALATION_SPRAY | Freq: Every day | RESPIRATORY_TRACT | Status: DC
  Administered 2023-08-23 – 2023-08-26 (×4): 1 via RESPIRATORY_TRACT
  Filled 2023-08-22: qty 28

## 2023-08-22 MED ORDER — FUROSEMIDE 40 MG PO TABS: 40.0000 mg | ORAL_TABLET | Freq: Two times a day (BID) | ORAL | Status: DC

## 2023-08-22 MED ORDER — PANTOPRAZOLE SODIUM 40 MG PO TBEC: 40.0000 mg | DELAYED_RELEASE_TABLET | Freq: Every day | ORAL | Status: DC

## 2023-08-22 MED ORDER — ATORVASTATIN CALCIUM 80 MG PO TABS
80.0000 mg | ORAL_TABLET | Freq: Every day | ORAL | Status: DC
  Administered 2023-08-23 – 2023-08-26 (×4): 80 mg via ORAL
  Filled 2023-08-22 (×4): qty 1

## 2023-08-22 MED ORDER — DULOXETINE HCL 60 MG PO CPEP
60.0000 mg | ORAL_CAPSULE | Freq: Every day | ORAL | Status: DC
  Administered 2023-08-23 – 2023-08-26 (×4): 60 mg via ORAL
  Filled 2023-08-22 (×4): qty 1

## 2023-08-22 MED ORDER — GABAPENTIN 300 MG PO CAPS
300.0000 mg | ORAL_CAPSULE | Freq: Two times a day (BID) | ORAL | Status: DC
Start: 2023-08-23 — End: 2023-08-26
  Administered 2023-08-23 – 2023-08-26 (×7): 300 mg via ORAL
  Filled 2023-08-22 (×7): qty 1

## 2023-08-22 NOTE — Consult Note (Signed)
 Consultation  Referring Provider: [facility]/ Truddie Hidden Primary Care Physician:  [name], MD Primary Gastroenterologist:  Dr.Danis  Reason for Consultation: Profound anemia   HPI: Jared [name] is a 68 y.o. male with with multiple comorbidities who presented to the emergency room today with complaints of generalized weakness and shortness of breath.  He also states that he was very shaky at home, and has not had an appetite. Lab evaluation in the ER revealed hemoglobin of 6.1/hematocrit of 21.8 down from hemoglobin of 11.6 about 3 weeks ago.  He has noted to have dark but not grossly melenic stool on rectal exam.  He is being admitted for further evaluation. He has history of nonischemic cardiomyopathy with EF of about 30%, coronary artery disease status post prior stenting, congestive heart failure, COPD for which he is on O2 2 L nasal chronically.  Also with history of diabetes mellitus, prior CVA, hypertension and iron deficiency anemia as well as peripheral vascular disease. He has history of atrial fibrillation, is on chronic Xarelto. Reviewing his chart he had undergone a very recent left femoral-popliteal angioplasty and stent with Dr. Criss Alvine on 08/02/2023 and was subsequently placed on Plavix and aspirin in addition to Xarelto.  Patient says he has not been having any abdominal pain but occasionally has had sharp pains in his abdomen ever since he underwent appendectomy in November 2024 which was complicated by abscess. Appetite has been off recently but no complaints of nausea or vomiting, no dysphagia or odynophagia.  No hematemesis. His bowel movements have been fairly regular and he has not noticed any melena or hematochezia. He has been taking his medications as instructed.  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  He has had multiple procedures over the past few years. His most recent colonoscopy was done  in March 2020 which was completely normal other than hemorrhoids. 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 He had EGD in July 2024 which was normal then repeat EGD in December 2024 with finding of a single AVM in the jejunum which was treated with APC.  Other labs today include respiratory panel which was negative BNP 100 Sodium 137/potassium 3.8/glucose 171/BUN 11/creatinine 1.04 LFTs within normal limits Chest x-ray no active disease  Past Medical History:  Diagnosis Date   Acute blood loss anemia 05/18/2023   Acute perforated appendicitis 04/30/2023   AKI (acute kidney injury) (HCC) 11/18/2020   Atrial fibrillation with RVR (HCC) 05/06/2023   AVM (arteriovenous malformation)    CAD (coronary artery disease)    a. LHC 5/12:  LAD 20, pLCx 20, pRCA 40, dRCA 40, EF 35%, diff HK  //  b. Myoview 4/16: Overall Impression:  High risk stress nuclear study There is no evidence of ischemia.  There is severe LV dysfunction. LV Ejection Fraction: 30%.  LV Wall Motion:  There is global LV hypokinesis.     CAP (community acquired pneumonia) 09/2013   Chronic combined systolic and diastolic CHF (congestive heart failure) (HCC)    a. Echo 4/16:Mild LVH, EF 40-45%, diffuse HK //  b. Echo 8/17: EF 35-40%, diffuse HK, diastolic dysfunction, aortic sclerosis, trivial MR, moderate LAE, normal RVSF, moderate RAE, mild TR, PASP 42 mmHg // c. Echo 4/18: Mild concentric LVH, EF 30-35, normal wall motion, grade 1 diastolic dysfunction, PASP 49  Chronic respiratory failure (HCC)    Cluster headache    "hx; haven't had one in awhile" (01/09/2016)   COPD (chronic obstructive pulmonary disease) (HCC)    [name] 01/09/2016   DM (diabetes mellitus) (HCC)    History of CVA (cerebrovascular accident)    Hypertension    IDA (iron deficiency anemia)    Moderate tobacco use disorder    NICM (nonischemic  cardiomyopathy) (HCC)    Nicotine addiction    Peripheral vascular disease (HCC)    Syncope 06/11/2019   Tobacco abuse    Type 2 diabetes mellitus (HCC) 05/14/2016    Past Surgical History:  Procedure Laterality Date   ABDOMINAL AORTOGRAM W/LOWER EXTREMITY N/A 12/07/2022   Procedure: ABDOMINAL AORTOGRAM W/LOWER EXTREMITY;  Surgeon: Leonie Douglas, MD;  [facility]: [facility];  Service: Cardiovascular;  Laterality: N/A;   ABDOMINAL AORTOGRAM W/LOWER EXTREMITY Left 08/02/2023   Procedure: ABDOMINAL AORTOGRAM W/LOWER EXTREMITY;  Surgeon: Leonie Douglas, MD;  Location: [facility];  Service: Cardiovascular;  Laterality: Left;   BIOPSY  11/12/2020   Procedure: BIOPSY;  Surgeon: Lemar Lofty., MD;  Location: [facility];  Service: Gastroenterology;;   CARDIAC CATHETERIZATION  10/2010   LM normal, LAD with 20% irregularities, LCX with 20%, RCA with 40% prox and 40% distal - EF of 35%   CATARACT EXTRACTION, BILATERAL     COLONOSCOPY W/ BIOPSIES AND POLYPECTOMY     COLONOSCOPY WITH PROPOFOL N/A 09/06/2018   Procedure: COLONOSCOPY WITH PROPOFOL;  Surgeon: Tressia Danas, MD;  Location: [facility] ENDOSCOPY;  Service: Gastroenterology;  Laterality: N/A;   ENTEROSCOPY N/A 09/28/2018   Procedure: ENTEROSCOPY;  Surgeon: Jeani Hawking, MD;  Location: [facility] ENDOSCOPY;  Service: Endoscopy;  Laterality: N/A;   ENTEROSCOPY N/A 10/28/2018   Procedure: ENTEROSCOPY;  Surgeon: Tressia Danas, MD;  Location: [facility] ENDOSCOPY;  Service: Gastroenterology;  Laterality: N/A;   ENTEROSCOPY N/A 10/09/2020   Procedure: ENTEROSCOPY;  Surgeon: Hilarie Fredrickson, MD;  Location: [facility] ENDOSCOPY;  Service: Endoscopy;  Laterality: N/A;   ENTEROSCOPY N/A 03/22/2022   Procedure: ENTEROSCOPY;  Surgeon: Benancio Deeds, MD;  Location: [facility] ENDOSCOPY;  Service: Gastroenterology;  Laterality: N/A;   ENTEROSCOPY N/A 05/20/2023   Procedure: ENTEROSCOPY;  Surgeon: Sherrilyn Rist, MD;  Location: [facility] ENDOSCOPY;  Service:  Gastroenterology;  Laterality: N/A;   ESOPHAGOGASTRODUODENOSCOPY N/A 11/12/2020   Procedure: ESOPHAGOGASTRODUODENOSCOPY (EGD);  Surgeon: Lemar Lofty., MD;  Location: [facility] ENDOSCOPY;  Service: Gastroenterology;  Laterality: N/A;   ESOPHAGOGASTRODUODENOSCOPY (EGD) WITH PROPOFOL N/A 09/05/2018   Procedure: ESOPHAGOGASTRODUODENOSCOPY (EGD) WITH PROPOFOL;  Surgeon: Benancio Deeds, MD;  Location: [facility] ENDOSCOPY;  Service: Gastroenterology;  Laterality: N/A;   ESOPHAGOGASTRODUODENOSCOPY (EGD) WITH PROPOFOL N/A 11/19/2020   Procedure: ESOPHAGOGASTRODUODENOSCOPY (EGD) WITH PROPOFOL;  Surgeon: Beverley Fiedler, MD;  Location: [facility] ENDOSCOPY;  Service: Gastroenterology;  Laterality: N/A;   ESOPHAGOGASTRODUODENOSCOPY (EGD) WITH PROPOFOL N/A 12/27/2022   Procedure: ESOPHAGOGASTRODUODENOSCOPY (EGD) WITH PROPOFOL;  Surgeon: Napoleon Form, MD;  Location: [facility]OSCOPY;  Service: Gastroenterology;  Laterality: N/A;   EXCISION MASS HEAD     GIVENS CAPSULE STUDY N/A 09/06/2018   Procedure: GIVENS CAPSULE STUDY;  Surgeon: Tressia Danas, MD;  Location: [facility] ENDOSCOPY;  Service: Gastroenterology;  Laterality: N/A;   GIVENS CAPSULE STUDY N/A 09/26/2018   Procedure: GIVENS CAPSULE STUDY;  Surgeon: Beverley Fiedler, MD;  Location: [facility] ENDOSCOPY;  Service: Gastroenterology;  Laterality: N/A;   GIVENS CAPSULE STUDY N/A 06/14/2019   Procedure: GIVENS CAPSULE STUDY;  Surgeon: Napoleon Form, MD;  Location: [facility]OSCOPY;  Service: Endoscopy;  Laterality: N/A;   HEMOSTASIS CLIP PLACEMENT  11/12/2020   Procedure: HEMOSTASIS CLIP PLACEMENT;  Surgeon: Lemar Lofty., MD;  Location: [facility] ENDOSCOPY;  Service: Gastroenterology;;   HEMOSTASIS CONTROL  11/12/2020   Procedure: HEMOSTASIS CONTROL;  Surgeon: Lemar Lofty., MD;  Location: [facility] ENDOSCOPY;  Service: Gastroenterology;;   HOT HEMOSTASIS N/A 10/28/2018   Procedure: HOT HEMOSTASIS (ARGON PLASMA COAGULATION/BICAP);  Surgeon: Tressia Danas, MD;  Location: [facility]  ENDOSCOPY;  Service: Gastroenterology;  Laterality: N/A;   HOT HEMOSTASIS N/A 11/12/2020   Procedure: HOT HEMOSTASIS (ARGON PLASMA COAGULATION/BICAP);  Surgeon: Lemar Lofty., MD;  Location: [facility] ENDOSCOPY;  Service: Gastroenterology;  Laterality: N/A;   HOT HEMOSTASIS N/A 03/22/2022   Procedure: HOT HEMOSTASIS (ARGON PLASMA COAGULATION/BICAP);  Surgeon: Benancio Deeds, MD;  Location: [facility] ENDOSCOPY;  Service: Gastroenterology;  Laterality: N/A;   HOT HEMOSTASIS N/A 05/20/2023   Procedure: HOT HEMOSTASIS (ARGON PLASMA COAGULATION/BICAP);  Surgeon: Sherrilyn Rist, MD;  Location: [facility] ENDOSCOPY;  Service: Gastroenterology;  Laterality: N/A;   INCISION AND DRAINAGE PERIRECTAL ABSCESS N/A 06/05/2017   Procedure: IRRIGATION AND DEBRIDEMENT PERIRECTAL ABSCESS;  Surgeon: Andria Meuse, MD;  Location: [facility] OR;  Service: General;  Laterality: N/A;   IR CATHETER TUBE CHANGE  05/14/2023   LAPAROSCOPIC APPENDECTOMY N/A 05/01/2023   Procedure: APPENDECTOMY LAPAROSCOPIC;  Surgeon: Emelia Loron, MD;  Location: [facility] OR;  Service: General;  Laterality: N/A;   PERIPHERAL VASCULAR BALLOON ANGIOPLASTY Left 08/02/2023   Procedure: PERIPHERAL VASCULAR BALLOON ANGIOPLASTY;  Surgeon: Leonie Douglas, MD;  [facility]: [facility];  Service: Cardiovascular;  Laterality: Left;   PERIPHERAL VASCULAR INTERVENTION  12/07/2022   Procedure: PERIPHERAL VASCULAR INTERVENTION;  Surgeon: Leonie Douglas, MD;  [facility];  Service: Cardiovascular;;   PERIPHERAL VASCULAR INTERVENTION Left 08/02/2023   Procedure: PERIPHERAL VASCULAR INTERVENTION;  Surgeon: Leonie Douglas, MD;  [facility];  Service: Cardiovascular;  Laterality: Left;   SUBMUCOSAL TATTOO INJECTION  11/12/2020   Procedure: SUBMUCOSAL TATTOO INJECTION;  Surgeon: Lemar Lofty., MD;  Location: [facility];  Service: Gastroenterology;;   SUBMUCOSAL TATTOO INJECTION  05/20/2023   Procedure: SUBMUCOSAL  TATTOO INJECTION;  Surgeon: Sherrilyn Rist, MD;  Location: [facility] ENDOSCOPY;  Service: Gastroenterology;;   VIDEO BRONCHOSCOPY Bilateral 05/08/2016   Procedure: VIDEO BRONCHOSCOPY WITH FLUORO;  Surgeon: Oretha Milch, MD;  Location: [facility] ENDOSCOPY;  Service: Cardiopulmonary;  Laterality: Bilateral;    Prior to Admission medications   Medication Sig Start Date End Date Taking? Authorizing Provider  acetaminophen (TYLENOL) 500 MG tablet Take 2 tablets (1,000 mg total) by mouth every 8 (eight) hours as needed for moderate pain. 01/03/23  Yes [name], MD  albuterol (PROVENTIL) (2.5 MG/3ML) 0.083% nebulizer solution Take 3 mLs (2.5 mg total) by nebulization every 2 (two) hours as needed for wheezing or shortness of breath. Patient taking differently: Take 2.5 mg by nebulization every 4 (four) hours as needed for wheezing or shortness of breath. 05/21/23  Yes Ghimire, Werner Lean, MD  albuterol (VENTOLIN HFA) 108 (90 Base) MCG/ACT inhaler INHALE 2 PUFFS INTO THE LUNGS EVERY 6 (SIX) HOURS AS NEEDED FOR WHEEZING OR SHORTNESS OF BREATH. 05/21/23  Yes Ghimire, Werner Lean, MD  aspirin EC 81 MG tablet Take 81 mg by mouth daily. Swallow whole.   Yes [provider]  atorvastatin (LIPITOR) 80 MG tablet Take 1 tablet (80 mg total) by mouth daily. 02/04/23  Yes [name], MD  Budeson-Glycopyrrol-Formoterol (BREZTRI AEROSPHERE) 160-9-4.8 MCG/ACT AERO Take 2 puffs first thing in morning and then another 2 puffs about 12 hours later. Patient taking differently: Inhale 2 puffs into the lungs in the morning and at bedtime. 08/15/23  Yes Nyoka Cowden, MD  clopidogrel (PLAVIX) 75 MG tablet Take 1 tablet (75 mg total) by mouth daily. 08/02/23  Yes Leonie Douglas, MD  DULoxetine (CYMBALTA) 60 MG capsule Take 1 capsule (60 mg total) by mouth daily. For chronic leg pains 06/06/23  Yes Marcine Matar, MD  ergocalciferol (DRISDOL) 1.25 MG (50000 UT) capsule Take 1 capsule (50,000 Units total) by mouth once  a week. 06/10/23  Yes [name], MD  ezetimibe (ZETIA) 10 MG tablet Take 1 tablet (10 mg total) by mouth daily. 06/06/23  Yes Pricilla Riffle, MD  ferrous sulfate (FEROSUL) 325 (65 FE) MG tablet Take 1 tablet (325 mg total) by mouth 2 (two) times daily with a meal. 05/21/23  Yes Ghimire, Werner Lean, MD  folic acid (FOLVITE) 1 MG tablet Take 1 tablet (1 mg total) by mouth daily. 01/30/23  Yes [name], MD  furosemide (LASIX) 40 MG tablet Take 1 tablet (40 mg total) by mouth 2 (two) times daily. 08/02/23  Yes [name], MD  gabapentin (NEURONTIN) 300 MG capsule Take 1 capsule (300 mg total) by mouth 2 (two) times daily. 06/10/23  Yes [name], MD  hydrALAZINE (APRESOLINE) 50 MG tablet Take 1 tablet (50 mg total) by mouth every 8 (eight) hours. Patient taking differently: Take 50 mg by mouth daily. 02/04/23  Yes [name], MD  isosorbide mononitrate (IMDUR) 30 MG 24 hr tablet Take 1 tablet (30 mg total) by mouth daily. 06/10/23  Yes [name], MD  metoprolol succinate (TOPROL-XL) 50 MG 24 hr tablet Take 1.5 tablets (75 mg total) by mouth daily. Take with or immediately following a meal. Patient taking differently: Take 50 mg by mouth daily. 05/21/23  Yes Ghimire, Werner Lean, MD  Multiple Vitamin (MULTIVITAMIN WITH MINERALS) TABS tablet Take 1 tablet by mouth daily. 05/22/23  Yes Ghimire, Werner Lean, MD  nitroGLYCERIN (NITROSTAT) 0.4 MG SL tablet Place 0.4 mg under the tongue every 5 (five) minutes as needed for chest pain.   Yes [provider]  OXYGEN Inhale 2 L/min into the lungs continuous.   Yes [provider]  pantoprazole (PROTONIX) 40 MG tablet Take 1 tablet (40 mg total) by mouth daily. 06/25/23  Yes [name], MD  rivaroxaban (XARELTO) 20 MG TABS tablet Take 1 tablet (20 mg total) by mouth daily with supper. 06/06/23  Yes Marcine Matar, MD  TUDORZA PRESSAIR 400 MCG/ACT AEPB INHALE 1 PUFF INTO THE LUNGS IN THE MORNING AND AT BEDTIME.  03/29/20 04/04/20  Nyoka Cowden, MD    Current Facility-Administered Medications  Medication Dose Route Frequency Provider Last Rate Last Admin   [START ON 08/23/2023] atorvastatin (LIPITOR) tablet 80 mg  80 mg Oral Daily Everhart, Kirstie, DO       [START ON 08/23/2023] budeson-glycopyrrolate-formoterol 160-9-4.8 MCG/ACT AERO 2 puff  2 puff Inhalation BID Everhart, Kirstie, DO       [START ON 08/23/2023] DULoxetine (CYMBALTA) DR capsule 60 mg  60 mg Oral Daily Everhart, Kirstie, DO       [START ON 08/23/2023] ezetimibe (ZETIA) tablet 10 mg  10 mg Oral Daily Everhart, Kirstie, DO       [START ON 08/23/2023] furosemide (LASIX) tablet 40 mg  40 mg Oral BID Everhart, Kirstie, DO       [  START ON 08/23/2023] gabapentin (NEURONTIN) capsule 300 mg  300 mg Oral BID Everhart, Kirstie, DO       [START ON 08/23/2023] metoprolol succinate (TOPROL-XL) 24 hr tablet 50 mg  50 mg Oral Daily Everhart, Kirstie, DO       nicotine (NICODERM CQ - dosed in mg/24 hours) patch 14 mg  14 mg Transdermal Daily Everhart, Kirstie, DO       pantoprazole (PROTONIX) injection 40 mg  40 mg Intravenous Q12H Everhart, Kirstie, DO       Current Outpatient Medications  Medication Sig Dispense Refill   acetaminophen (TYLENOL) 500 MG tablet Take 2 tablets (1,000 mg total) by mouth every 8 (eight) hours as needed for moderate pain. 100 tablet 1   albuterol (PROVENTIL) (2.5 MG/3ML) 0.083% nebulizer solution Take 3 mLs (2.5 mg total) by nebulization every 2 (two) hours as needed for wheezing or shortness of breath. (Patient taking differently: Take 2.5 mg by nebulization every 4 (four) hours as needed for wheezing or shortness of breath.)     albuterol (VENTOLIN HFA) 108 (90 Base) MCG/ACT inhaler INHALE 2 PUFFS INTO THE LUNGS EVERY 6 (SIX) HOURS AS NEEDED FOR WHEEZING OR SHORTNESS OF BREATH.     aspirin EC 81 MG tablet Take 81 mg by mouth daily. Swallow whole.     atorvastatin (LIPITOR) 80 MG tablet Take 1 tablet (80 mg total) by mouth  daily. 90 tablet 1   Budeson-Glycopyrrol-Formoterol (BREZTRI AEROSPHERE) 160-9-4.8 MCG/ACT AERO Take 2 puffs first thing in morning and then another 2 puffs about 12 hours later. (Patient taking differently: Inhale 2 puffs into the lungs in the morning and at bedtime.) 10.7 g 11   clopidogrel (PLAVIX) 75 MG tablet Take 1 tablet (75 mg total) by mouth daily. 30 tablet 11   DULoxetine (CYMBALTA) 60 MG capsule Take 1 capsule (60 mg total) by mouth daily. For chronic leg pains 30 capsule 3   ergocalciferol (DRISDOL) 1.25 MG (50000 UT) capsule Take 1 capsule (50,000 Units total) by mouth once a week. 12 capsule 1   ezetimibe (ZETIA) 10 MG tablet Take 1 tablet (10 mg total) by mouth daily. 90 tablet 2   ferrous sulfate (FEROSUL) 325 (65 FE) MG tablet Take 1 tablet (325 mg total) by mouth 2 (two) times daily with a meal.     folic acid (FOLVITE) 1 MG tablet Take 1 tablet (1 mg total) by mouth daily. 90 tablet 1   furosemide (LASIX) 40 MG tablet Take 1 tablet (40 mg total) by mouth 2 (two) times daily. 60 tablet 2   gabapentin (NEURONTIN) 300 MG capsule Take 1 capsule (300 mg total) by mouth 2 (two) times daily. 90 capsule 1   hydrALAZINE (APRESOLINE) 50 MG tablet Take 1 tablet (50 mg total) by mouth every 8 (eight) hours. (Patient taking differently: Take 50 mg by mouth daily.) 270 tablet 1   isosorbide mononitrate (IMDUR) 30 MG 24 hr tablet Take 1 tablet (30 mg total) by mouth daily. 90 tablet 1   metoprolol succinate (TOPROL-XL) 50 MG 24 hr tablet Take 1.5 tablets (75 mg total) by mouth daily. Take with or immediately following a meal. (Patient taking differently: Take 50 mg by mouth daily.)     Multiple Vitamin (MULTIVITAMIN WITH MINERALS) TABS tablet Take 1 tablet by mouth daily.     nitroGLYCERIN (NITROSTAT) 0.4 MG SL tablet Place 0.4 mg under the tongue every 5 (five) minutes as needed for chest pain.     OXYGEN Inhale 2 L/min into  the lungs continuous.     pantoprazole (PROTONIX) 40 MG tablet Take 1  tablet (40 mg total) by mouth daily. 90 tablet 1   rivaroxaban (XARELTO) 20 MG TABS tablet Take 1 tablet (20 mg total) by mouth daily with supper. 90 tablet 0    Allergies as of 08/22/2023 - Review Complete 08/22/2023  Allergen Reaction Noted   Lisinopril Cough 06/29/2014    Family History  Problem Relation Age of Onset   Heart disease Mother    Diabetes Mother    Colon cancer Mother    Liver cancer Mother    Cancer Father        type unknown   Diabetes Sister        x 2   Diabetes Brother     Social History   Socioeconomic History   Marital status: Single    Spouse name: Not on file   Number of children: 1   Years of education: Not on file   Highest education level: 12th grade  Occupational History   Occupation: retired  Tobacco Use   Smoking status: Every Day    Current packs/day: 0.50    Average packs/day: 0.5 packs/day for 47.0 years (23.5 ttl pk-yrs)    Types: Cigarettes   Smokeless tobacco: Never   Tobacco comments:    4/5 cigs  Vaping Use   Vaping status: Never Used  Substance and Sexual Activity   Alcohol use: Not Currently    Alcohol/week: 0.0 standard drinks of alcohol    Comment: last drink was before xmas   Drug use: No    Types: Cocaine, Marijuana    Comment: "nothing in 20 years"   Sexual activity: Not Currently  Other Topics Concern   Not on file  Social History Narrative   unemployed   Social Drivers of Corporate investment banker Strain: Low Risk  (06/10/2023)   Overall Financial Resource Strain (CARDIA)    Difficulty of Paying Living Expenses: Not hard at all  Food Insecurity: No Food Insecurity (06/13/2023)   Hunger Vital Sign    Worried About Running Out of Food in the Last Year: Never true    Ran Out of Food in the Last Year: Never true  Recent Concern: Food Insecurity - Food Insecurity Present (06/10/2023)   Hunger Vital Sign    Worried About Running Out of Food in the Last Year: Sometimes true    Ran Out of Food in the Last Year:  Never true  Transportation Needs: No Transportation Needs (06/13/2023)   PRAPARE - Administrator, Civil Service (Medical): No    Lack of Transportation (Non-Medical): No  Physical Activity: Insufficiently Active (06/10/2023)   Exercise Vital Sign    Days of Exercise per Week: 3 days    Minutes of Exercise per Session: 20 min  Stress: No Stress Concern Present (06/10/2023)   [employer] of Occupational Health - Occupational Stress Questionnaire    Feeling of Stress : Not at all  Social Connections: Moderately Isolated (06/10/2023)   Social Connection and Isolation Panel [NHANES]    Frequency of Communication with Friends and Family: More than three times a week    Frequency of Social Gatherings with Friends and Family: Never    Attends Religious Services: Never    Database administrator or Organizations: Yes    Attends Banker Meetings: Never    Marital Status: Divorced  Catering manager Violence: Not At Risk (06/13/2023)   Humiliation, Afraid, Rape,  and Kick questionnaire    Fear of Current or Ex-Partner: No    Emotionally Abused: No    Physically Abused: No    Sexually Abused: No    Review of Systems: Pertinent positive and negative review of systems were noted in the above HPI section.  All other review of systems was otherwise negative.   Physical Exam: Vital signs in last 24 hours: Temp:  [98.5 F (36.9 C)] 98.5 F (36.9 C) (03/13 1127) Pulse Rate:  [61] 61 (03/13 1127) Resp:  [18-22] 20 (03/13 1230) BP: (106-151)/(59-115) 108/59 (03/13 1230) SpO2:  [97 %] 97 % (03/13 1127)   General:   Alert,  Well-developed, well-nourished,older AA male pleasant and cooperative in NAD-on nasal O2 at 2 L Head:  Normocephalic and atraumatic. Eyes:  Sclera clear, no icterus.   Conjunctiva pink. Ears:  Normal auditory acuity. Nose:  No deformity, discharge,  or lesions. Mouth:  No deformity or lesions.   Neck:  Supple; no masses or thyromegaly. Lungs:   Clear throughout to auscultation.   No wheezes, crackles, or rhonchi.  Heart:  Regular rate and rhythm; no murmurs, clicks, rubs,  or gallops. Abdomen:  Soft,nontender, BS active,nonpalp mass or hsm.   Rectal: Dark but not frankly melenic stool per ER Msk:  Symmetrical without gross deformities. . Pulses:  Normal pulses noted. Extremities:  Without clubbing or edema. Neurologic:  Alert and  oriented x4;  grossly normal neurologically. Skin:  Intact without significant lesions or rashes.. Psych:  Alert and cooperative. Normal mood and affect.  Intake/Output from previous day: No intake/output data recorded. Intake/Output this shift: No intake/output data recorded.  Lab Results: Recent Labs    08/22/23 1148  WBC 8.0  HGB 6.1*  HCT 21.8*  PLT 569*   BMET Recent Labs    08/22/23 1148  NA 137  K 3.8  CL 97*  CO2 28  GLUCOSE 171*  BUN 11  CREATININE 1.04  CALCIUM 8.6*   LFT Recent Labs    08/22/23 1148  PROT 6.5  ALBUMIN 3.2*  AST 19  ALT 22  ALKPHOS 64  BILITOT 0.3   PT/INR No results for input(s): "LABPROT", "INR" in the last 72 hours. Hepatitis Panel No results for input(s): "HEPBSAG", "HCVAB", "HEPAIGM", "HEPBIGM" in the last 72 hours.    IMPRESSION:  #55 68 year old African-American male presenting to the emergency room with weakness and shortness of breath noted over the past few days. Workup thus far most significant for hemoglobin of 6.1/hematocrit of 21.8 down from a hemoglobin of 11.6 documented on 08/02/2023.  Dark but not grossly melenic stool noted on rectal exam  Patient is currently on aspirin Plavix and Xarelto  He is status post left Pham popliteal angioplasty and stent on 08/02/2023 at which point aspirin and Plavix were added.  He has history of recurrent chronic intermittent GI blood loss and iron deficiency secondary to small bowel AVMs.  Suspect that he has had slow GI blood loss over the past 3 weeks since his left Pham popliteal  angioplasty and stent placement in the setting of addition of antiplatelet therapy with Plavix and aspirin  #2 COPD on chronic oxygen 2 L #3.  Coronary artery disease status post prior stents #4.  Nonischemic cardiomyopathy-last echo with EF improved to 50 to 55% moderately reduced right ventricular systolic function #5 diabetes mellitus #6.  Prior CVA #7.  Status post appendectomy November 2024 course complicated by abscess  Plan; okay for diet today Transfuse 2 units of packed  RBCs Trend hemoglobin every 8-12 hours, and transfuse as indicated to keep his hemoglobin closer to 8 given his multiple comorbidities and need for ongoing antiplatelet therapy  Will hold Xarelto Will need to communicate with his vascular surgeon Dr. Jen Mow unlikely that Plavix can be held, maybe hold aspirin defer to vascular surgery  We can plan for EGD/enteroscopy this admission possibly tomorrow, however APC of any AVMs may be less effective in setting of Plavix.  GI will follow with you    Matan Steen PA-C  08/22/2023, 3:17 PM

## 2023-08-22 NOTE — Hospital Course (Addendum)
 Jared [name] is a 68 y.o. male with history of A-fib (on Xarelto), COPD, CAD, type 2 diabetes, hypertension, heart failure improved ejection fraction who was admitted for symptomatic anemia requiring blood transfusion and concern for GI bleed.  Symptomatic anemia Initial hemoglobin of 6.1, with a positive fecal occult blood test.  Started on IV PPI.  Patient received total 3U pRBCs transfusion, and hemoglobin increased appropriately.  Transfusion threshold was set at 8.0, given history of coronary artery disease. GI was consulted and recommended endoscopy, which showed 3 non-bleeding angioectasias which were treated with APC; No other areas of concern identified. Hgb remained stable and patient was discharged in good condition.   Other chronic conditions were medically managed with home medications and formulary alternatives as necessary (HTN, T2DM, CHF, COPD, CAD)  Follow-up recommendations Repeat CBC by PCP at hospital follow-up.  Consider scheduling IV iron on an outpatient setting Recommend cardiology follow-up

## 2023-08-22 NOTE — Assessment & Plan Note (Addendum)
 Platelets 569, hx of thrombocytosis during last admission thought to be inflammatory response. - continue to monitor - am CBC

## 2023-08-22 NOTE — ED Provider Notes (Signed)
 [place] EMERGENCY DEPARTMENT AT [facility] Provider Note   CSN: [id number] Arrival date & time: 08/22/23  1123     History  Chief Complaint  Patient presents with   Shortness of Breath    Jared [name] is a 68 y.o. male with history of A-fib on Xarelto, COPD on 2 L nasal cannula at home, hypertension, CAD, type 2 diabetes, presents with concern for generalized weakness and shortness of breath.  States this has been ongoing for months, but worse in the last week or so.  He also reports decreased appetite and mild dry cough.  He denies any chest pain.  Denies any bloody or black stools.   Shortness of Breath      Home Medications Prior to Admission medications   Medication Sig Start Date End Date Taking? Authorizing Provider  acetaminophen (TYLENOL) 500 MG tablet Take 2 tablets (1,000 mg total) by mouth every 8 (eight) hours as needed for moderate pain. 01/03/23  Yes [name], MD  albuterol (PROVENTIL) (2.5 MG/3ML) 0.083% nebulizer solution Take 3 mLs (2.5 mg total) by nebulization every 2 (two) hours as needed for wheezing or shortness of breath. Patient taking differently: Take 2.5 mg by nebulization every 4 (four) hours as needed for wheezing or shortness of breath. 05/21/23  Yes Ghimire, Werner Lean, MD  albuterol (VENTOLIN HFA) 108 (90 Base) MCG/ACT inhaler INHALE 2 PUFFS INTO THE LUNGS EVERY 6 (SIX) HOURS AS NEEDED FOR WHEEZING OR SHORTNESS OF BREATH. 05/21/23  Yes Ghimire, Werner Lean, MD  aspirin EC 81 MG tablet Take 81 mg by mouth daily. Swallow whole.   Yes [provider]  atorvastatin (LIPITOR) 80 MG tablet Take 1 tablet (80 mg total) by mouth daily. 02/04/23  Yes [name], MD  Budeson-Glycopyrrol-Formoterol (BREZTRI AEROSPHERE) 160-9-4.8 MCG/ACT AERO Take 2 puffs first thing in morning and then another 2 puffs about 12 hours later. Patient taking differently: Inhale 2 puffs into the lungs in the morning and at bedtime. 08/15/23  Yes Nyoka Cowden, MD  clopidogrel (PLAVIX) 75 MG tablet Take 1 tablet (75 mg total) by mouth daily. 08/02/23  Yes Leonie Douglas, MD  DULoxetine (CYMBALTA) 60 MG capsule Take 1 capsule (60 mg total) by mouth daily. For chronic leg pains 06/06/23  Yes Marcine Matar, MD  ergocalciferol (DRISDOL) 1.25 MG (50000 UT) capsule Take 1 capsule (50,000 Units total) by mouth once a week. 06/10/23  Yes [name], MD  ezetimibe (ZETIA) 10 MG tablet Take 1 tablet (10 mg total) by mouth daily. 06/06/23  Yes Pricilla Riffle, MD  ferrous sulfate (FEROSUL) 325 (65 FE) MG tablet Take 1 tablet (325 mg total) by mouth 2 (two) times daily with a meal. 05/21/23  Yes Ghimire, Werner Lean, MD  folic acid (FOLVITE) 1 MG tablet Take 1 tablet (1 mg total) by mouth daily. 01/30/23  Yes [name], MD  furosemide (LASIX) 40 MG tablet Take 1 tablet (40 mg total) by mouth 2 (two) times daily. 08/02/23  Yes [name], MD  gabapentin (NEURONTIN) 300 MG capsule Take 1 capsule (300 mg total) by mouth 2 (two) times daily. 06/10/23  Yes [name], MD  hydrALAZINE (APRESOLINE) 50 MG tablet Take 1 tablet (50 mg total) by mouth every 8 (eight) hours. Patient taking differently: Take 50 mg by mouth daily. 02/04/23  Yes [name], MD  isosorbide mononitrate (IMDUR) 30 MG 24 hr tablet Take 1 tablet (30 mg total) by mouth daily. 06/10/23  Yes Newlin, Enobong,  MD  metoprolol succinate (TOPROL-XL) 50 MG 24 hr tablet Take 1.5 tablets (75 mg total) by mouth daily. Take with or immediately following a meal. Patient taking differently: Take 50 mg by mouth daily. 05/21/23  Yes Ghimire, Werner Lean, MD  Multiple Vitamin (MULTIVITAMIN WITH MINERALS) TABS tablet Take 1 tablet by mouth daily. 05/22/23  Yes Ghimire, Werner Lean, MD  nitroGLYCERIN (NITROSTAT) 0.4 MG SL tablet Place 0.4 mg under the tongue every 5 (five) minutes as needed for chest pain.   Yes [provider]  OXYGEN Inhale 2 L/min into the lungs continuous.   Yes  [provider]  pantoprazole (PROTONIX) 40 MG tablet Take 1 tablet (40 mg total) by mouth daily. 06/25/23  Yes [name], MD  rivaroxaban (XARELTO) 20 MG TABS tablet Take 1 tablet (20 mg total) by mouth daily with supper. 06/06/23  Yes Marcine Matar, MD  TUDORZA PRESSAIR 400 MCG/ACT AEPB INHALE 1 PUFF INTO THE LUNGS IN THE MORNING AND AT BEDTIME. 03/29/20 04/04/20  Nyoka Cowden, MD      Allergies    Lisinopril    Review of Systems   Review of Systems  Respiratory:  Positive for shortness of breath.     Physical Exam Updated Vital Signs BP (!) 108/59   Pulse 61   Temp 98.5 F (36.9 C)   Resp 20   SpO2 97%  Physical Exam Vitals and nursing note reviewed. Exam conducted with a chaperone present.  Constitutional:      General: He is not in acute distress.    Appearance: He is well-developed.  HENT:     Head: Normocephalic and atraumatic.  Eyes:     Conjunctiva/sclera: Conjunctivae normal.  Cardiovascular:     Rate and Rhythm: Tachycardia present. Rhythm irregular.     Heart sounds: No murmur heard.    Comments: Dorsalis pedis and radial pulses 2+ bilaterally Pulmonary:     Effort: Pulmonary effort is normal. No respiratory distress.     Breath sounds: Normal breath sounds.     Comments: On baseline 2 L nasal cannula, pursed lip breathing Abdominal:     Palpations: Abdomen is soft.     Tenderness: There is no abdominal tenderness.  Genitourinary:    Comments: RN chaperone present.   Dark but not melanotic appearing stool.  No bright red blood per rectum. Musculoskeletal:        General: No swelling.     Cervical back: Neck supple.     Right lower leg: No edema.     Left lower leg: No edema.  Skin:    General: Skin is warm and dry.     Capillary Refill: Capillary refill takes less than 2 seconds.  Neurological:     Mental Status: He is alert.  Psychiatric:        Mood and Affect: Mood normal.     ED Results / Procedures / Treatments    Labs (all labs ordered are listed, but only abnormal results are displayed) Labs Reviewed  COMPREHENSIVE METABOLIC PANEL - Abnormal; Notable for the following components:      Result Value   Chloride 97 (*)    Glucose, Bld 171 (*)    Calcium 8.6 (*)    Albumin 3.2 (*)    All other components within normal limits  CBC - Abnormal; Notable for the following components:   RBC 2.43 (*)    Hemoglobin 6.1 (*)    HCT 21.8 (*)    MCH 25.1 (*)  MCHC 28.0 (*)    RDW 17.2 (*)    Platelets 569 (*)    nRBC 0.5 (*)    All other components within normal limits  POC OCCULT BLOOD, ED - Abnormal; Notable for the following components:   Fecal Occult Bld POSITIVE (*)    All other components within normal limits  RESP PANEL BY RT-PCR (RSV, FLU A&B, COVID)  RVPGX2  BRAIN NATRIURETIC PEPTIDE  TYPE AND SCREEN  PREPARE RBC (CROSSMATCH)    EKG None  Radiology No results found.  Procedures .Critical Care  Performed by: Arabella Merles, PA-C Authorized by: Arabella Merles, PA-C   Critical care provider statement:    Critical care time (minutes):  30   Critical care was necessary to treat or prevent imminent or life-threatening deterioration of the following conditions:  Circulatory failure (Anemia requiring blood transfusion)   Critical care was time spent personally by me on the following activities:  Development of treatment plan with patient or surrogate, discussions with consultants, evaluation of patient's response to treatment, examination of patient, ordering and review of laboratory studies, ordering and review of radiographic studies, ordering and performing treatments and interventions, pulse oximetry, re-evaluation of patient's condition, review of old charts and obtaining history from patient or surrogate   Care discussed with: admitting provider       Medications Ordered in ED Medications  0.9 %  sodium chloride infusion (Manually program via Guardrails IV Fluids) (has no  administration in time range)  diphenhydrAMINE (BENADRYL) injection 25 mg (has no administration in time range)    ED Course/ Medical Decision Making/ A&P Clinical Course as of 08/22/23 1451  Thu Aug 22, 2023  1206 Patient presents tachycardic to 110 with baseline A-fib.  Will give 5 mg metoprolol to see if this improves rate. [AF]  1324 Discussed the risks and benefits of blood transfusion with patient, he would like to proceed with transfusion  [AF]  1334 Heart rate on reevaluation is in the 90s.  Will hold metoprolol for now.   [AF]    Clinical Course User Index [AF] Arabella Merles, PA-C                                 Medical Decision Making Amount and/or Complexity of Data Reviewed Labs: ordered. Radiology: ordered.  Risk Prescription drug management.     Differential diagnosis includes but is not limited to COPD exacerbation, CHF, pneumonia, COVID, flu, RSV, A-fib with RVR, electrolyte abnormality, anemia  ED Course:  Patient overall well-appearing, stable vital signs on arrival aside from a slight tachycardia to 110 and A-fib.  No A-fib with RVR.  This did come down on its own into the 90s when resting in bed.  Will hold on rate control medications for now.  Hemoccult was obtained, stool appeared brown, not acutely melanotic.  No bright red blood per rectum. I Ordered, and personally interpreted labs.  The pertinent results include:   CBC with hemoglobin of 6.1.  This is down from 11.6 taken 2 weeks ago. CMP with elevated glucose at 171, hypocalcemia 8.6.  No elevation in LFTs or creatinine. BNP within normal limits RSV, flu, COVID swab pending Hemoccult positive  Suspect his low hemoglobin of 6.1 today is the reason he feels weak and more short of breath.  This is an acute change, last hemoglobin was 11.6 two weeks ago.  Given the positive Hemoccult, suspect he may have an occult GI  bleed.  I did consult GI PA Amy Esterwood, they will plan to see patient in consult.     I had a shared decision making conversation with the patient and discussed the risks of blood transfusion, including: Fever and/or chills. An allergic reaction with hives and/or difficulty breathing. Liver disease (viral hepatitis) may occur after the transfusion. The risk for hepatitis B is less than one in two hundred thousand (<1:200,000). The risk for hepatitis C is one in one million, nine hundred thousand (1:1,900,000). Red blood cells are sometimes destroyed (hemolysis). Additionally, blood from persons who have been exposed to the acquired immunodeficiency syndrome (AIDS) virus can transmit the infection. The risk is one in two million, one hundred thousand (1:2,100,000). The risk of any reaction to transfusion is 2%-6%. The risk of death from transfusion is one in one million, five hundred thousand (1:1,500,000).  All patient questions answered. Patient wished to proceed with blood transfusion.    Impression: Low hemoglobin, likely due to lower GI bleed  Disposition:  Admission with hospitalist Dr. Rexene Alberts for further management, GI to follow along in consult  Imaging Studies ordered: I ordered imaging studies including chest x-ray I independently visualized the imaging with scope of interpretation limited to determining acute life threatening conditions related to emergency care. Imaging showed no pleural effusion or pneumothorax, no widened mediastinum Radiologist interpretation pending   Cardiac Monitoring: / EKG: The patient was maintained on a cardiac monitor.  I personally viewed and interpreted the cardiac monitored which showed an underlying rhythm of: A-fib with tachycardia in the 110s   Consultations Obtained: I requested consultation with the gastroenterologist Amy Esterwood,  and discussed lab and imaging findings as well as pertinent plan - they recommend: They will plan to see patient in consult  I requested consultation with the hospitalist Dr. Rexene Alberts,   and discussed lab and imaging findings as well as pertinent plan - they recommend: admission for further management  External records from outside source obtained and reviewed including I stat chem 8 from 2 weeks ago with hemoglobin 11.6              Final Clinical Impression(s) / ED Diagnoses Final diagnoses:  Low hemoglobin    Rx / DC Orders ED Discharge Orders     None         Arabella Merles, PA-C 08/22/23 1451    Anders Simmonds T, DO 08/23/23 [phone]

## 2023-08-22 NOTE — Plan of Care (Signed)
 FMTS Brief Progress Note  S:Patient reports no changes since admission. States his shortness of breath is the same as previously when he was admitted. Otherwise no complaints. Attempting to sleep.   O: BP 112/69 (BP Location: Left Arm)   Pulse 96   Temp 98.7 F (37.1 C) (Oral)   Resp 18   SpO2 100%   General: NAD, lying comfortably in hospital bed Neuro: A&O Cardiovascular: RRR, no murmurs, no peripheral edema Respiratory: normal WOB on RA, CTAB, no wheezes, ronchi or rales Abdomen: soft, NTTP, no rebound or guarding Extremities: Moving all 4 extremities equally, CR <2s   A/P: Acute symptomatic anemia  GI bleed Likely repeat AVM. VSS. Receiving 2u pRBCs. -GI consulted recs appreciated -Follow-up Hgb post tranfusion -CBC ordered -NPO at midnight -SCDs hold anticoagulation  - Orders reviewed. Labs for AM ordered, which was adjusted as needed.  - Remainder of plan per day team.  Celine Mans, MD 08/22/2023, 8:22 PM PGY-2, [facility] Night Resident  Please page [phone] with questions.

## 2023-08-22 NOTE — Assessment & Plan Note (Addendum)
 Not in afib at the time of my exam, controlled.  - Hold Toprol-XL 50mg  daily due to soft blood pressures - Consider Metoprolol tartrate overnight if needed for rate control - Holding anticoagulation at this time due to suspected acute bleeding - Cardiac monitoring

## 2023-08-22 NOTE — Progress Notes (Signed)
   FMTS Attending Brief Note: Jared Starr, MD   For questions about this patient, please use [email or web address] to page the family medicine resident on call. Pager number [phone].    I have seen and examined this patient, and reviewed their chart. I have discussed this patient with the resident. I agree with the resident's findings, assessment and care plan.  68 yo with history of angioectasia of the small bowel, PAD s/p recent femoropopliteal angioplasty with stent in February, atrial fibrillation on Xeralto, and COPD on home oxygen presenting with fatigue and left leg pain.  Reports general fatigue and weakness that has worsened. Since stent on 2/21 reports pain in L side intermittently shooting down to left leg. Reports breathing is always poor and has worsened.  Reports he is taking aspirin and Xeralto--not taking plavix. No NSAIDs or BC powder. No alcohol use. Denies melena, hematochezia, or hematemesis   Hemoglobin in ED down to 6.1.    PMH- reviewed PSH- reviewed Socially- lives alone, smokes 1/2 ppd, no alcohol use.   Acute on chronic anemia, likely contributing to symptoms of dyspnea and fatigue. Hold aspirin and Xeralto, blood ordered by ED. Consult to GI. Likely cause is GI blood loss. Vitals q4h, trend CBC, cardiac monitoring, IV PPI BID.  Left leg pain, unclear cause, repeat ABI and will ask vascular to see related to this and ASA/Plavix in setting of recent stent.  Remainder per resident note as available.

## 2023-08-22 NOTE — Assessment & Plan Note (Addendum)
 Reports intermittent left leg pain since left fem-pop angioplasty and stent with Dr. Garnette Czech 08/02/23.  - ABI LLE - Spoke with Dr. Chestine Spore, vascular, who says it is okay to come off Plavix at this point - Consider vascular consult if ABI abnormal

## 2023-08-22 NOTE — Assessment & Plan Note (Addendum)
 Hx chronic IDA and anemia 2/2 acute GI blood loss due to AVMs of small bowel. Last colonoscopy 08/2018 with hemorrhoids, otherwise unremarkable. 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 Hemoglobin 11.6 (2/21) to 6.1 today. Suspect anemia in setting of acute blood loss from GI source.  - Admitted to FMTS, med-tele, Dr. Manson Passey attending - GI consulted, appreciate recommendations - 2U PRBCs - SCDs for VTE ppx - Continuous cardiac monitoring - NPO at midnight in anticipation of GI procedure - H&H q8h - Iron, TIBC, vit B12, ferritin, CBC, BMP in AM - Transfusion threshold <8

## 2023-08-22 NOTE — ED Triage Notes (Signed)
 Pt reports generalized weakness and SHOB. Hx of COPD, on 2 L via [place] at baseline. Pt endorses decreased appetite and "shaking." Tachypnea noted during assessment. Denies any CP, cough, or congestion. Afib on EKG, known hx on Xarelto.

## 2023-08-22 NOTE — H&P (Addendum)
 Hospital Admission History and Physical Service Pager: [phone]  Patient name: Jared [name] Medical record number: [id number] Date of Birth: Apr 01, 1956 Age: 68 y.o. Gender: male  Primary Care Provider: [name], MD Consultants: GI, vascular surgery Code Status: FULL which was confirmed with patient Preferred Emergency Contact:  Contact Information     Name Relation Home Work Mobile   Tisdale,Jacqueline Sister [phone]  [phone]      Other Contacts   None on File    Chief Complaint: symptomatic acute on chronic anemia  Assessment and Plan: [name] is a 68 y.o. male presenting with symptomatic anemia. Differential for presentation of this includes  Acute blood loss from GI source - most likely given history of the same. Admitted 04/2023 for sepsis due to acute appendecitis, was anemic at that time with + fecal occult, EGD 12/9 showing jejunal AVM nonbleeding and resumed Xarelto. Appears to have had multiple procedures over last few years for AVMs. Will ask GI to evaluate. GI blood loss has resulted in  Iron deficiency - likely contributing as pt has history of this, unclear if taking iron supplements, will obtain iron panel in AM Could also consider hemolytic anemia although less likely  Assessment & Plan Acute on chronic anemia Hx chronic IDA and anemia 2/2 acute GI blood loss due to AVMs of small bowel. Last colonoscopy 08/2018 with hemorrhoids, otherwise unremarkable. 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 Hemoglobin 11.6 (2/21) to 6.1 today. Suspect anemia in setting of acute blood loss from GI source.  - Admitted to FMTS, med-tele, Dr. Manson Passey attending - GI consulted, appreciate recommendations - 2U PRBCs - SCDs for VTE ppx - Continuous cardiac monitoring - NPO at midnight in anticipation of GI procedure - H&H q8h - Iron, TIBC, vit B12, ferritin, CBC, BMP in AM - Transfusion threshold <8 Left leg  pain Reports intermittent left leg pain since left fem-pop angioplasty and stent with Dr. Garnette Czech 08/02/23.  - ABI LLE - Spoke with Dr. Chestine Spore, vascular, who says it is okay to come off Plavix at this point - Consider vascular consult if ABI abnormal PAF (paroxysmal atrial fibrillation) (HCC) Not in afib at the time of my exam, controlled.  - Hold Toprol-XL 50mg  daily due to soft blood pressures - Consider Metoprolol tartrate overnight if needed for rate control - Holding anticoagulation at this time due to suspected acute bleeding - Cardiac monitoring  Thrombocytosis Platelets 569, hx of thrombocytosis during last admission thought to be inflammatory response. - continue to monitor - am CBC  Chronic and Stable Problems:  COPD - continue Breztri formulary equivalent 2 puffs BID CAD - continue Atorvastatin 80mg  daily Chronic Leg pain - continue Cymbalta 60mg  daily, Gabapentin 300mg  BID HLD - continue Zetia 10mg  daily, Atorvastatin as above Tobacco use - nicotine patch ordered GERD - on protonix 40mg  daily at home, will continue 40mg  IV BID    FEN/GI: Heart healthy diet  VTE Prophylaxis: SCDs  Disposition: Med-surg  History of Present Illness:  Jared [name] is a 68 y.o. male presenting with increased SOB over last few weeks and generalized weakness.  Also with decreased appetite and shaking.  States he symptoms have been going on for several months but worsened in the last week.  Denies chest pain, nausea, vomiting, hematemesis, melena, hematuria, abdominal pain.  Reports that he has been admitted several times for anemia and "they can never figure out what's causing it"  In the  ED, Hgb 11.6>6.1. Hemoccult positive.  BNP normal, respiratory viral panel negative.  1 unit packed red blood cells were ordered.  GI was consulted and plans to see.  Review Of Systems: Per HPI with the following additions: none  Pertinent Past Medical History: Acute blood loss  anemia AKI Afib CAD CAP HFpEF  COPD DM CVA HTN IDA Tobacco use disorder NICM PVD T2DM  Remainder reviewed in history tab.   Pertinent Past Surgical History: Cardiac cath 2012 Appendectomy 2024 Peripheral vascular balloon angioplasty 07/2023   Remainder reviewed in history tab.  Pertinent Social History: Tobacco use: 1PPD  Alcohol use: No Other Substance use: None, hx cocaine/marijuana Lives alone  Pertinent Family History: Mother - heart disease, DM, colon cancer, liver cancer Father - cancer  Remainder reviewed in history tab.   Important Outpatient Medications: Albuterol inhaler as needed Lipitor 80 mg daily Breztri 2 puffs twice daily Cymbalta 60 mg daily Zetia 10 mg daily Ferrous sulfate 325 mg twice daily Folate 1 mg daily Lasix 40 mg twice daily Gabapentin 300 mg twice daily Metoprolol 50 mg daily Nitroglycerin as needed for chest pain Protonix 40 mg daily Xarelto 20 mg daily  Remainder reviewed in medication history.   Objective: BP 125/74   Pulse 80   Temp 98 F (36.7 C)   Resp 20   SpO2 100%  Exam: General: No acute distress, resting comfortably Eyes: EOMI, no scleral icterus ENTM: Moist mucous membranes Cardiovascular: Regular rate and rhythm at the time of my exam, no murmurs Respiratory: Slight coarse breath sounds in bases, good air movement throughout.  Speaking in complete sentences Gastrointestinal: Normal bowel sounds, soft, nontender MSK: No leg swelling bilateral lower extremities.  2+ DP PT pulses bilateral lower extremities.  No calf erythema, warmth, tenderness  Labs:  CBC BMET  Recent Labs  Lab 08/22/23 1148  WBC 8.0  HGB 6.1*  HCT 21.8*  PLT 569*   Recent Labs  Lab 08/22/23 1148  NA 137  K 3.8  CL 97*  CO2 28  BUN 11  CREATININE 1.04  GLUCOSE 171*  CALCIUM 8.6*     Flu, RSV, Covid negative BNP 100 Fecal occult blood positive   EKG: Atrial fibrillation with RVR at 125BPM. Left axis deviation. No acute  STT changes.   Imaging Studies Performed:  CXR 08/22/23 IMPRESSION: No active disease.  I independently reviewed and agree with radiologist's impression of imaging study. Hyperinflation with no acute abnormality  Para March, DO 08/22/2023, 3:46 PM PGY-1, [facility]  FPTS Intern pager: [phone], text pages welcome Secure chat group [facility] Teaching Service

## 2023-08-23 ENCOUNTER — Inpatient Hospital Stay (HOSPITAL_COMMUNITY)

## 2023-08-23 ENCOUNTER — Encounter (HOSPITAL_COMMUNITY)

## 2023-08-23 DIAGNOSIS — I4892 Unspecified atrial flutter: Secondary | ICD-10-CM | POA: Diagnosis not present

## 2023-08-23 DIAGNOSIS — Z789 Other specified health status: Secondary | ICD-10-CM | POA: Diagnosis not present

## 2023-08-23 DIAGNOSIS — D649 Anemia, unspecified: Secondary | ICD-10-CM | POA: Diagnosis not present

## 2023-08-23 DIAGNOSIS — Z8719 Personal history of other diseases of the digestive system: Secondary | ICD-10-CM | POA: Diagnosis not present

## 2023-08-23 DIAGNOSIS — E1169 Type 2 diabetes mellitus with other specified complication: Secondary | ICD-10-CM

## 2023-08-23 DIAGNOSIS — I2510 Atherosclerotic heart disease of native coronary artery without angina pectoris: Secondary | ICD-10-CM | POA: Diagnosis not present

## 2023-08-23 DIAGNOSIS — I480 Paroxysmal atrial fibrillation: Secondary | ICD-10-CM

## 2023-08-23 LAB — CBC
HCT: 25.6 % — ABNORMAL LOW (ref 39.0–52.0)
Hemoglobin: 7.4 g/dL — ABNORMAL LOW (ref 13.0–17.0)
MCH: 25.3 pg — ABNORMAL LOW (ref 26.0–34.0)
MCHC: 28.9 g/dL — ABNORMAL LOW (ref 30.0–36.0)
MCV: 87.4 fL (ref 80.0–100.0)
Platelets: 487 10*3/uL — ABNORMAL HIGH (ref 150–400)
RBC: 2.93 MIL/uL — ABNORMAL LOW (ref 4.22–5.81)
RDW: 17.8 % — ABNORMAL HIGH (ref 11.5–15.5)
WBC: 7.9 10*3/uL (ref 4.0–10.5)
nRBC: 0.4 % — ABNORMAL HIGH (ref 0.0–0.2)

## 2023-08-23 LAB — BASIC METABOLIC PANEL
Anion gap: 9 (ref 5–15)
BUN: 11 mg/dL (ref 8–23)
CO2: 31 mmol/L (ref 22–32)
Calcium: 8.6 mg/dL — ABNORMAL LOW (ref 8.9–10.3)
Chloride: 100 mmol/L (ref 98–111)
Creatinine, Ser: 0.81 mg/dL (ref 0.61–1.24)
GFR, Estimated: >60 mL/min (ref >60)
Glucose, Bld: 114 mg/dL — ABNORMAL HIGH (ref 70–99)
Potassium: 4.2 mmol/L (ref 3.5–5.1)
Sodium: 140 mmol/L (ref 135–145)

## 2023-08-23 LAB — HEMOGLOBIN AND HEMATOCRIT, BLOOD
HCT: 25.6 % — ABNORMAL LOW (ref 39.0–52.0)
HCT: 27.5 % — ABNORMAL LOW (ref 39.0–52.0)
Hemoglobin: 7.5 g/dL — ABNORMAL LOW (ref 13.0–17.0)
Hemoglobin: 8 g/dL — ABNORMAL LOW (ref 13.0–17.0)

## 2023-08-23 LAB — IRON AND TIBC
Iron: 10 ug/dL — ABNORMAL LOW (ref 45–182)
Saturation Ratios: 3 % — ABNORMAL LOW (ref 17.9–39.5)
TIBC: 305 ug/dL (ref 250–450)
UIBC: 295 ug/dL

## 2023-08-23 LAB — FERRITIN: Ferritin: 16 ng/mL — ABNORMAL LOW (ref 24–336)

## 2023-08-23 LAB — VITAMIN B12: Vitamin B-12: 561 pg/mL (ref 180–914)

## 2023-08-23 LAB — PREPARE RBC (CROSSMATCH)

## 2023-08-23 MED ORDER — SODIUM CHLORIDE 0.9% IV SOLUTION: Freq: Once | INTRAVENOUS | Status: AC

## 2023-08-23 MED ORDER — IOHEXOL 350 MG/ML SOLN
75.0000 mL | Freq: Once | INTRAVENOUS | Status: AC | PRN
  Administered 2023-08-23: 75 mL via INTRAVENOUS

## 2023-08-23 MED ORDER — ACETAMINOPHEN 500 MG PO TABS
1000.0000 mg | ORAL_TABLET | Freq: Four times a day (QID) | ORAL | Status: DC
  Administered 2023-08-23 – 2023-08-25 (×7): 1000 mg via ORAL
  Filled 2023-08-23 (×7): qty 2

## 2023-08-23 MED ORDER — METOPROLOL TARTRATE 25 MG PO TABS
25.0000 mg | ORAL_TABLET | Freq: Two times a day (BID) | ORAL | Status: DC
  Administered 2023-08-23 – 2023-08-26 (×7): 25 mg via ORAL
  Filled 2023-08-23 (×2): qty 1
  Filled 2023-08-23: qty 2
  Filled 2023-08-23 (×4): qty 1

## 2023-08-23 MED ORDER — OXYCODONE HCL 5 MG PO TABS
5.0000 mg | ORAL_TABLET | Freq: Once | ORAL | Status: AC
  Administered 2023-08-23: 5 mg via ORAL
  Filled 2023-08-23: qty 1

## 2023-08-23 NOTE — Plan of Care (Signed)

## 2023-08-23 NOTE — Anesthesia Preprocedure Evaluation (Addendum)
 Anesthesia Evaluation  Patient identified by MRN, date of birth, ID band Patient awake    Reviewed: Allergy & Precautions, NPO status , Patient's Chart, lab work & pertinent test results, reviewed documented beta blocker date and time   History of Anesthesia Complications Negative for: history of anesthetic complications  Airway Mallampati: I  TM Distance: >3 FB Neck ROM: Full    Dental  (+) Missing, Partial Lower, Dental Advisory Given,    Pulmonary COPD,  oxygen dependent, Current Smoker and Patient abstained from smoking.   Pulmonary exam normal        Cardiovascular hypertension, Pt. on home beta blockers and Pt. on medications + CAD, + Peripheral Vascular Disease and +CHF  Normal cardiovascular exam   TTE 05/02/23: EF 55-60%, grade II DD, RV function moderately reduced, moderate RVE, mild LAE     Neuro/Psych  Headaches CVA, No Residual Symptoms    GI/Hepatic Neg liver ROS, PUD,GERD  Medicated and Controlled,,GI bleed   Endo/Other  diabetes, Well Controlled, Type 2    Renal/GU      Musculoskeletal   Abdominal   Peds  Hematology  (+) Blood dyscrasia (Hgb 7.4), anemia  On xarelto    Anesthesia Other Findings   Reproductive/Obstetrics                             Anesthesia Physical Anesthesia Plan  ASA: 4  Anesthesia Plan: MAC   Post-op Pain Management: Minimal or no pain anticipated   Induction:   PONV Risk Score and Plan: 0 and Treatment may vary due to age or medical condition and Propofol infusion  Airway Management Planned: Natural Airway and Nasal Cannula  Additional Equipment: None  Intra-op Plan:   Post-operative Plan:   Informed Consent: I have reviewed the patients History and Physical, chart, labs and discussed the procedure including the risks, benefits and alternatives for the proposed anesthesia with the patient or authorized representative who has indicated  his/her understanding and acceptance.       Plan Discussed with: CRNA and Anesthesiologist  Anesthesia Plan Comments:        Anesthesia Quick Evaluation

## 2023-08-23 NOTE — Assessment & Plan Note (Signed)
 COPD - continue Breztri formulary equivalent CAD - continue Atorvastatin 80mg  daily Chronic Leg pain - continue Cymbalta 60mg  daily, Gabapentin 300mg  BID HLD - continue Zetia 10mg  daily, Atorvastatin as above Tobacco use - 14 mg nicotine patch ordered GERD - on protonix 40mg  daily at home, will continue 40mg  IV BID in light of suspected GI bleed

## 2023-08-23 NOTE — Progress Notes (Addendum)
 Patient ID: Jared [name], male   DOB: 09-18-1955, 68 y.o.   MRN: [id number]     Progress Note   Subjective   Day #2 CC; profound anemia, and in setting of aspirin, Plavix and Xarelto  Hemoglobin 6.1 on admission down to 5 g from 3 weeks ago when he was initially initiated on aspirin and Plavix Hemoglobin 7.5  today post transfusions-total of 3 units since admission  Ferritin 16/iron 10/TIBC 305/iron sat 3   Objective   Vital signs in last 24 hours: Temp:  [97.3 F (36.3 C)-98.8 F (37.1 C)] 98.6 F (37 C) (03/14 1321) Pulse Rate:  [62-97] 92 (03/14 1321) Resp:  [16-19] 16 (03/14 1321) BP: (97-128)/(62-78) 120/75 (03/14 1321) SpO2:  [97 %-100 %] 100 % (03/14 1321) Last BM Date : 08/23/23 General: Well-developed older African-American male in NAD, getting blood transfusion Heart:  Regular rate and rhythm; no murmurs Lungs: Respirations even and unlabored, lungs CTA bilaterally Abdomen:  Soft, nontender and nondistended. Normal bowel sounds. Extremities: No edema Neurologic:  Alert and oriented,  grossly normal neurologically. Psych:  Cooperative. Normal mood and affect.  Intake/Output from previous day: 03/13 0701 - 03/14 0700 In: 242 [Blood:242] Out: 450 [Urine:450] Intake/Output this shift: Total I/O In: -  Out: 400 [Urine:400]  Lab Results: Recent Labs    08/22/23 1148 08/22/23 2209 08/23/23 0740 08/23/23 1141  WBC 8.0  --  7.9  --   HGB 6.1* 6.3* 7.4* 7.5*  HCT 21.8* 21.9* 25.6* 25.6*  PLT 569*  --  487*  --    BMET Recent Labs    08/22/23 1148 08/23/23 0740  NA 137 140  K 3.8 4.2  CL 97* 100  CO2 28 31  GLUCOSE 171* 114*  BUN 11 11  CREATININE 1.04 0.81  CALCIUM 8.6* 8.6*   LFT Recent Labs    08/22/23 1148  PROT 6.5  ALBUMIN 3.2*  AST 19  ALT 22  ALKPHOS 64  BILITOT 0.3   PT/INR No results for input(s): "LABPROT", "INR" in the last 72 hours.  Studies/Results: DG Chest Port 1 View Result Date: 08/22/2023 CLINICAL DATA:  Shortness of  breath EXAM: PORTABLE CHEST 1 VIEW COMPARISON:  05/11/2023 FINDINGS: Heart and mediastinal contours are within normal limits. No focal opacities or effusions. No acute bony abnormality. Aortic atherosclerosis. IMPRESSION: No active disease. Electronically Signed   By: Charlett Nose M.D.   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  Prior history of CVA # 7.  Status post appendectomy November 2024-course complicated by abscess-has had occasional pains in his  abdomen since CT has been ordered   Plan; n.p.o. after midnight Patient will be scheduled for enteroscopy with Dr. Doy Hutching for tomorrow morning 08/24/2023.  Procedure was discussed in detail with the patient including indications risk benefits and he is agreeable to proceed  Continue to trend hemoglobin and transfuse to keep hemoglobin above 7 Plavix has been held since admission Xarelto held since admission. F/U CT abd GI will continue to follow with you.    Principal Problem:   Acute on chronic anemia Active Problems:   Essential hypertension   CAD (coronary artery  disease)   COPD GOLD III/still smoking    Type 2 diabetes mellitus (HCC)   Atrial flutter (HCC)   Chronic respiratory failure with hypoxia (HCC)   Symptomatic anemia   Anemia due to chronic blood loss   AVM (arteriovenous malformation) of stomach, acquired   Peripheral vascular disease (HCC)   PAF (paroxysmal atrial fibrillation) (HCC)   History of CVA (cerebrovascular accident)   Heart failure with improved ejection fraction (HFimpEF) (HCC)   Anemia   Thrombocytosis   Left leg pain   Chronic health problem     LOS: 1 day   Evony Rezek EsterwoodPA-C  08/23/2023, 2:07 PM

## 2023-08-23 NOTE — Assessment & Plan Note (Addendum)
 Platelets downtrending this AM, 569 > 487. Hx of thrombocytosis during last admission as well. This is most likely reactive thrombocytosis in the setting of profound IDA. - trend with AM CBC

## 2023-08-23 NOTE — Assessment & Plan Note (Addendum)
 Patient does have history of chronic IDA and anemia secondary to acute GI blood loss, with known AVMs of small bowel.  3 weeks ago hemoglobin was 11.6, down on admission to 6.1.  S/p 2U PRBCs. Hgb after transfusions 7.4; threshold is 8 so will give additional unit now. Iron labs with evidence of IDA. B12 WNL. - GI consulted, appreciate recommendations below - Anticipate EGD tomorrow 3/15 to give time for Eliquis to washout - order additional 1U PRBCs with post-transfusion H&H - NPO at midnight in anticipation of GI procedure -Continue H&H q8h - Transfusion threshold <8

## 2023-08-23 NOTE — Assessment & Plan Note (Addendum)
 On exam this AM did have irregular rhythm; reviewed telemetry and pt is not in Afib, having PACs. Incidentally pt had 8 beat run of V tach while I was viewing telemetry - presented to bedside and pt denied any symptoms. HR in 90s. - Holding Toprol-XL 50mg  daily due to soft blood pressures - Will give metoprolol tartrate 25mg  BID today; discontinue if BP drops - Holding anticoagulation at this time due to suspected acute bleeding - Continue cardiac monitoring

## 2023-08-23 NOTE — Assessment & Plan Note (Addendum)
 This has been a chronic problem for the patient.  He reports he has been told in the past it is probably due to low back "spine narrowing."  Patient does intermittently shoot down the left leg.  No numbness or tingling.  Reports his pain is moderate this morning, and hopes for medicine for some relief. - Gabapentin 300mg   BID - tylenol 1000 mg q6 scheduled - ABI LLE - Consider vascular consult if ABI abnormal

## 2023-08-23 NOTE — Progress Notes (Signed)
 Daily Progress Note Intern Pager: [phone]  Patient name: Jared [name] Medical record number: [id number] Date of birth: 1956/03/18 Age: 68 y.o. Gender: male  Primary Care Provider: [name], MD Consultants: GI, vascular surgery Code Status: Full  Pt Overview and Major Events to Date:  3/13-admitted  Assessment and Plan: Jared [name] is a 68 year old male with multiple prior presentations for symptomatic anemia, who presented and was admitted yesterday for the same. S/p 2 U PRBCs but Hgb remains below goal 8.0 so will transfuse additional unit now; pt asymptomatic at this time.  Assessment & Plan Acute on chronic anemia Patient does have history of chronic IDA and anemia secondary to acute GI blood loss, with known AVMs of small bowel.  3 weeks ago hemoglobin was 11.6, down on admission to 6.1.  S/p 2U PRBCs. Hgb after transfusions 7.4; threshold is 8 so will give additional unit now. Iron labs with evidence of IDA. B12 WNL. - GI consulted, appreciate recommendations below - Anticipate EGD tomorrow 3/15 to give time for Eliquis to washout - order additional 1U PRBCs with post-transfusion H&H - NPO at midnight in anticipation of GI procedure -Continue H&H q8h - Transfusion threshold <8 Left leg pain This has been a chronic problem for the patient.  He reports he has been told in the past it is probably due to low back "spine narrowing."  Patient does intermittently shoot down the left leg.  No numbness or tingling.  Reports his pain is moderate this morning, and hopes for medicine for some relief. - Gabapentin 300mg   BID - tylenol 1000 mg q6 scheduled - ABI LLE - Consider vascular consult if ABI abnormal PAF (paroxysmal atrial fibrillation) (HCC) On exam this AM did have irregular rhythm; reviewed telemetry and pt is not in Afib, having PACs. Incidentally pt had 8 beat run of V tach while I was viewing telemetry - presented to bedside and pt denied any symptoms. HR in  90s. - Holding Toprol-XL 50mg  daily due to soft blood pressures - Will give metoprolol tartrate 25mg  BID today; discontinue if BP drops - Holding anticoagulation at this time due to suspected acute bleeding - Continue cardiac monitoring  Thrombocytosis Platelets downtrending this AM, 569 > 487. Hx of thrombocytosis during last admission as well. This is most likely reactive thrombocytosis in the setting of profound IDA. - trend with AM CBC Chronic health problem COPD - continue Breztri formulary equivalent CAD - continue Atorvastatin 80mg  daily Chronic Leg pain - continue Cymbalta 60mg  daily, Gabapentin 300mg  BID HLD - continue Zetia 10mg  daily, Atorvastatin as above Tobacco use - 14 mg nicotine patch ordered GERD - on protonix 40mg  daily at home, will continue 40mg  IV BID in light of suspected GI bleed   FEN/GI: Carb modified diet; NPO at midnight in anticipation of EGD tomorrow PPx: SCDs; holding chemical prophylaxis in setting of bleeding Dispo:Home pending clinical improvement . Barriers include further GI workup.   Subjective:  Patient sitting up in bed this morning eating breakfast.  He reports he is on his home oxygen of 2 L and his breathing is improved.  He denies nausea, vomiting, bleeding from any source.  No changes in bowel or bladder recently.  Reports pain to right LE for which she would like some medicine; he has taken Tylenol at home without relief, as well as oxycodone at one point with some relief but he ran out.  Objective: Temp:  [97.3 F (36.3 C)-98.8 F (37.1 C)] 98.4 F (36.9 C) (  03/14 1004) Pulse Rate:  [62-97] 89 (03/14 1004) Resp:  [16-20] 19 (03/14 0403) BP: (97-128)/(59-78) 128/78 (03/14 1004) SpO2:  [97 %-100 %] 100 % (03/14 1004) Physical Exam: General: Well-appearing, pleasant, NAD Cardiovascular: Irregular rhythm, no murmurs. Respiratory: CTA bilaterally.  Normal work of breathing on 2L O2 via [place].   Abdomen: Normoactive bowel sounds.  Soft, mildly  tender to palpation on the right.  No guarding.  No hepatosplenomegaly. Extremities: SCDs in place.  5/5 strength in bilateral LEs.  Neurovascularly intact.  Laboratory: Most recent CBC Lab Results  Component Value Date   WBC 7.9 08/23/2023   HGB 7.4 (L) 08/23/2023   HCT 25.6 (L) 08/23/2023   MCV 87.4 08/23/2023   PLT 487 (H) 08/23/2023   Most recent BMP    Latest Ref Rng & Units 08/23/2023    7:40 AM  BMP  Glucose 70 - 99 mg/dL 409   BUN 8 - 23 mg/dL 11   Creatinine 8.11 - 1.24 mg/dL 9.14   Sodium 782 - 956 mmol/L 140   Potassium 3.5 - 5.1 mmol/L 4.2   Chloride 98 - 111 mmol/L 100   CO2 22 - 32 mmol/L 31   Calcium 8.9 - 10.3 mg/dL 8.6    O13 WNL Ferritin low 16 Iron low 10 TIBC WNL 305  Cyndia Skeeters, DO 08/23/2023, 12:08 PM  PGY-1, [facility] FPTS Intern pager: [phone], text pages welcome Secure chat group [facility] Teaching Service

## 2023-08-24 ENCOUNTER — Encounter (HOSPITAL_COMMUNITY): Payer: Self-pay | Admitting: Family Medicine

## 2023-08-24 ENCOUNTER — Inpatient Hospital Stay (HOSPITAL_COMMUNITY): Payer: Self-pay | Admitting: Anesthesiology

## 2023-08-24 ENCOUNTER — Encounter (HOSPITAL_COMMUNITY)
Admission: EM | Disposition: A | Discharge status: Home Health | Payer: Self-pay | Source: Home / Self Care | Attending: Family Medicine

## 2023-08-24 DIAGNOSIS — D649 Anemia, unspecified: Secondary | ICD-10-CM | POA: Diagnosis not present

## 2023-08-24 DIAGNOSIS — K5520 Angiodysplasia of colon without hemorrhage: Secondary | ICD-10-CM | POA: Diagnosis not present

## 2023-08-24 DIAGNOSIS — F17210 Nicotine dependence, cigarettes, uncomplicated: Secondary | ICD-10-CM | POA: Diagnosis not present

## 2023-08-24 DIAGNOSIS — K31819 Angiodysplasia of stomach and duodenum without bleeding: Secondary | ICD-10-CM | POA: Diagnosis not present

## 2023-08-24 DIAGNOSIS — K2289 Other specified disease of esophagus: Secondary | ICD-10-CM

## 2023-08-24 DIAGNOSIS — I2510 Atherosclerotic heart disease of native coronary artery without angina pectoris: Secondary | ICD-10-CM | POA: Diagnosis not present

## 2023-08-24 HISTORY — PX: ENTEROSCOPY: SHX5533

## 2023-08-24 HISTORY — PX: HOT HEMOSTASIS: SHX5433

## 2023-08-24 LAB — BASIC METABOLIC PANEL
Anion gap: 6 (ref 5–15)
BUN: 13 mg/dL (ref 8–23)
CO2: 34 mmol/L — ABNORMAL HIGH (ref 22–32)
Calcium: 8.8 mg/dL — ABNORMAL LOW (ref 8.9–10.3)
Chloride: 99 mmol/L (ref 98–111)
Creatinine, Ser: 0.8 mg/dL (ref 0.61–1.24)
GFR, Estimated: >60 mL/min (ref >60)
Glucose, Bld: 105 mg/dL — ABNORMAL HIGH (ref 70–99)
Potassium: 4.1 mmol/L (ref 3.5–5.1)
Sodium: 139 mmol/L (ref 135–145)

## 2023-08-24 LAB — BPAM RBC
Blood Product Expiration Date: 202503192359
Blood Product Expiration Date: 202504122359
Blood Product Expiration Date: 202504132359
ISSUE DATE / TIME: 202503131827
ISSUE DATE / TIME: 202503132310
ISSUE DATE / TIME: 202503141301
Unit Type and Rh: 5100
Unit Type and Rh: 5100
Unit Type and Rh: 9500

## 2023-08-24 LAB — HEMOGLOBIN AND HEMATOCRIT, BLOOD
HCT: 29.1 % — ABNORMAL LOW (ref 39.0–52.0)
HCT: 28 % — ABNORMAL LOW (ref 39.0–52.0)
Hemoglobin: 8.4 g/dL — ABNORMAL LOW (ref 13.0–17.0)
Hemoglobin: 8.3 g/dL — ABNORMAL LOW (ref 13.0–17.0)

## 2023-08-24 LAB — TYPE AND SCREEN
ABO/RH(D): O POS
Antibody Screen: NEGATIVE
Unit division: 0
Unit division: 0
Unit division: 0

## 2023-08-24 LAB — CBC
HCT: 28.8 % — ABNORMAL LOW (ref 39.0–52.0)
Hemoglobin: 8.5 g/dL — ABNORMAL LOW (ref 13.0–17.0)
MCH: 26 pg (ref 26.0–34.0)
MCHC: 29.5 g/dL — ABNORMAL LOW (ref 30.0–36.0)
MCV: 88.1 fL (ref 80.0–100.0)
Platelets: 452 10*3/uL — ABNORMAL HIGH (ref 150–400)
RBC: 3.27 MIL/uL — ABNORMAL LOW (ref 4.22–5.81)
RDW: 17.1 % — ABNORMAL HIGH (ref 11.5–15.5)
WBC: 7.8 10*3/uL (ref 4.0–10.5)
nRBC: 0.3 % — ABNORMAL HIGH (ref 0.0–0.2)

## 2023-08-24 LAB — GLUCOSE, CAPILLARY: Glucose-Capillary: 146 mg/dL — ABNORMAL HIGH (ref 70–99)

## 2023-08-24 SURGERY — ENTEROSCOPY
Anesthesia: Monitor Anesthesia Care

## 2023-08-24 MED ORDER — PHENYLEPHRINE HCL (PRESSORS) 10 MG/ML IV SOLN
INTRAVENOUS | Status: DC | PRN
Start: 2023-08-24 — End: 2023-08-24
  Administered 2023-08-24 (×2): 160 ug via INTRAVENOUS

## 2023-08-24 MED ORDER — GLUCAGON HCL RDNA (DIAGNOSTIC) 1 MG IJ SOLR
INTRAMUSCULAR | Status: DC | PRN
Start: 2023-08-24 — End: 2023-08-24
  Administered 2023-08-24: .3 mg via INTRAVENOUS

## 2023-08-24 MED ORDER — PANTOPRAZOLE SODIUM 40 MG PO TBEC
40.0000 mg | DELAYED_RELEASE_TABLET | Freq: Every day | ORAL | Status: DC
  Administered 2023-08-24 – 2023-08-26 (×3): 40 mg via ORAL
  Filled 2023-08-24 (×3): qty 1

## 2023-08-24 MED ORDER — PROPOFOL 10 MG/ML IV BOLUS
INTRAVENOUS | Status: DC | PRN
  Administered 2023-08-24: 30 mg via INTRAVENOUS
  Administered 2023-08-24 (×3): 20 mg via INTRAVENOUS
  Administered 2023-08-24: 50 mg via INTRAVENOUS
  Administered 2023-08-24: 20 mg via INTRAVENOUS
  Administered 2023-08-24: 30 mg via INTRAVENOUS
  Administered 2023-08-24 (×2): 20 mg via INTRAVENOUS

## 2023-08-24 MED ORDER — GLUCAGON HCL RDNA (DIAGNOSTIC) 1 MG IJ SOLR
INTRAMUSCULAR | Status: AC
  Filled 2023-08-24: qty 1

## 2023-08-24 MED ORDER — LIDOCAINE 2% (20 MG/ML) 5 ML SYRINGE
INTRAMUSCULAR | Status: DC | PRN
  Administered 2023-08-24: 100 mg via INTRAVENOUS

## 2023-08-24 MED ORDER — SODIUM CHLORIDE 0.9 % IV SOLN: INTRAVENOUS | Status: DC | PRN

## 2023-08-24 NOTE — Assessment & Plan Note (Signed)
 COPD - Continue Breztri formulary equivalent (Incruse Ellipta) Chronic Leg pain - Continue duloxetine 60 mg daily, gabapentin 300 mg BID HLD, CAD - Continue ezetimibe 10 mg daily, atorvastatin 80 mg daily Tobacco use - 14 mg nicotine patch PRN GERD - On home pantoprazole 40 mg daily, now on 40 mg IV BID as above

## 2023-08-24 NOTE — Anesthesia Postprocedure Evaluation (Signed)
 Anesthesia Post Note  Patient: Jared [name]  Procedure(s) Performed: ENTEROSCOPY EGD, WITH ARGON PLASMA COAGULATION     Patient location during evaluation: PACU Anesthesia Type: MAC Level of consciousness: awake and alert Pain management: pain level controlled Vital Signs Assessment: post-procedure vital signs reviewed and stable Respiratory status: spontaneous breathing, nonlabored ventilation and respiratory function stable Cardiovascular status: stable and blood pressure returned to baseline Anesthetic complications: no   There were no known notable events for this encounter.  Last Vitals:  Vitals:   08/24/23 0830 08/24/23 0845  BP: 116/71 120/80  Pulse: 83 79  Resp: (!) 21 (!) 23  Temp:    SpO2: (!) 89% 97%    Last Pain:  Vitals:   08/24/23 0845  TempSrc:   PainSc: 0-No pain                 [name]

## 2023-08-24 NOTE — Evaluation (Signed)
 Physical Therapy Evaluation Patient Details Name: Jared [name] MRN: [id number] DOB: 1956-05-03 Today's Date: 08/24/2023  History of Present Illness  Pt is 68 year old presented to [facility] on  08/22/23 for acute blood loss anemia. PMH - perforated appendicitis, syncope, AVM, CABG, CAD, CAP, CHF, COPD on home O2, CVA, HTN, A flutter, DM2  Clinical Impression  Pt presents to PT with slight decr in mobility due to illness and inactivity. Expect pt will make good progress back to baseline with mobility. Will follow acutely but doubt pt will need PT after DC.          If plan is discharge home, recommend the following: Assist for transportation   Can travel by private vehicle        Equipment Recommendations None recommended by PT  Recommendations for Other Services       Functional Status Assessment Patient has had a recent decline in their functional status and demonstrates the ability to make significant improvements in function in a reasonable and predictable amount of time.     Precautions / Restrictions Precautions Precautions: Fall Restrictions Weight Bearing Restrictions Per Provider Order: No      Mobility  Bed Mobility Overal bed mobility: Modified Independent                  Transfers Overall transfer level: Needs assistance Equipment used: None Transfers: Sit to/from Stand, Bed to chair/wheelchair/BSC Sit to Stand: Supervision   Step pivot transfers: Supervision       General transfer comment: for safety    Ambulation/Gait               General Gait Details: Pt deferred due to dyspnea  Stairs            Wheelchair Mobility     Tilt Bed    Modified Rankin (Stroke Patients Only)       Balance Overall balance assessment: Mild deficits observed, not formally tested                                           Pertinent Vitals/Pain Pain Assessment Pain Assessment: No/denies pain    Home Living Family/patient  expects to be discharged to:: Private residence Living Arrangements: Alone   Type of Home: House Home Access: Stairs to enter Entrance Stairs-Rails: Can reach both;Left;Right Entrance Stairs-Number of Steps: 4   Home Layout: One level Home Equipment: Rollator (4 wheels);Rolling Walker (2 wheels);Shower seat;Grab bars - tub/shower;BSC/3in1 Additional Comments: 2L O2 all times    Prior Function Prior Level of Function : Independent/Modified Independent;History of Falls (last six months)             Mobility Comments: Rollator in community, no AD in home. Pt reports falls after experiencing syncope. Pt can usually tell when these episodes will happen ADLs Comments: Mod I with ADL/IADLs, sister or medicaid provides transportation     Extremity/Trunk Assessment   Upper Extremity Assessment Upper Extremity Assessment: Defer to OT evaluation    Lower Extremity Assessment Lower Extremity Assessment: Generalized weakness       Communication   Communication Communication: No apparent difficulties    Cognition Arousal: Alert Behavior During Therapy: WFL for tasks assessed/performed   PT - Cognitive impairments: No apparent impairments  Following commands: Intact       Cueing       General Comments General comments (skin integrity, edema, etc.): Pt on 2L O2    Exercises     Assessment/Plan    PT Assessment Patient needs continued PT services  PT Problem List Decreased strength;Decreased activity tolerance;Decreased balance;Decreased mobility       PT Treatment Interventions DME instruction;Gait training;Stair training;Functional mobility training;Therapeutic activities;Therapeutic exercise;Balance training;Patient/family education    PT Goals (Current goals can be found in the Care Plan section)  Acute Rehab PT Goals Patient Stated Goal: go home PT Goal Formulation: With patient Time For Goal Achievement: 09/07/23 Potential  to Achieve Goals: Good    Frequency Min 2X/week     Co-evaluation               AM-PAC PT "6 Clicks" Mobility  Outcome Measure Help needed turning from your back to your side while in a flat bed without using bedrails?: None Help needed moving from lying on your back to sitting on the side of a flat bed without using bedrails?: None Help needed moving to and from a bed to a chair (including a wheelchair)?: A Little Help needed standing up from a chair using your arms (e.g., wheelchair or bedside chair)?: A Little Help needed to walk in hospital room?: A Little Help needed climbing 3-5 steps with a railing? : A Little 6 Click Score: 20    End of Session Equipment Utilized During Treatment: Oxygen Activity Tolerance: Patient limited by fatigue Patient left: in chair;with call bell/phone within reach;with chair alarm set Nurse Communication: Mobility status PT Visit Diagnosis: Muscle weakness (generalized) (M62.81);Other abnormalities of gait and mobility (R26.89)    Time: 9562-1308 PT Time Calculation (min) (ACUTE ONLY): 17 min   Charges:   PT Evaluation $PT Eval Low Complexity: 1 Low   PT General Charges $$ ACUTE PT VISIT: 1 Visit         [facility] PT Acute Rehabilitation Services Office [phone]   Angelina Ok [facility] 08/24/2023, 4:11 PM

## 2023-08-24 NOTE — H&P (Signed)
 [facility] History and Physical   Primary Care Physician:  [name], MD   Reason for Procedure:  Anemia, history of small bowel AVMs, concern for small bowel GI bleeding  Plan:    Small Bowel enteroscopy     HPI: Jared [name] is a 68 y.o. male with a past medical history of CAD, heart failure, COPD, diabetes, CVA, A-fib, PAD admitted with acute on chronic anemia.  Previous hemoglobin was 11.3 and decreased to 6.  Patient reports profound weakness but denies melena or hematochezia. 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 He had EGD in July 2024 which was normal then repeat EGD in December 2024 with finding of a single AVM in the jejunum which was treated with APC.  Hemoglobin has now improved to 8 after transfusion.  States that he is feeling better.  Notes some abdominal tenderness over the right upper quadrant -no pathology in this region on CT   Past Medical History:  Diagnosis Date   Acute blood loss anemia 05/18/2023   Acute perforated appendicitis 04/30/2023   AKI (acute kidney injury) (HCC) 11/18/2020   Atrial fibrillation with RVR (HCC) 05/06/2023   AVM (arteriovenous malformation)    CAD (coronary artery disease)    a. LHC 5/12:  LAD 20, pLCx 20, pRCA 40, dRCA 40, EF 35%, diff HK  //  b. Myoview 4/16: Overall Impression:  High risk stress nuclear study There is no evidence of ischemia.  There is severe LV dysfunction. LV Ejection Fraction: 30%.  LV Wall Motion:  There is global LV hypokinesis.     CAP (community acquired pneumonia) 09/2013   Chronic combined systolic and diastolic CHF (congestive heart failure) (HCC)    a. Echo 4/16:Mild LVH, EF 40-45%, diffuse HK //  b. Echo 8/17: EF 35-40%, diffuse HK, diastolic dysfunction, aortic sclerosis, trivial MR, moderate LAE, normal RVSF, moderate RAE, mild TR, PASP 42 mmHg // c. Echo 4/18: Mild  concentric LVH, EF 30-35, normal wall motion, grade 1 diastolic dysfunction, PASP 49   Chronic respiratory failure (HCC)    Cluster headache    "hx; haven't had one in awhile" (01/09/2016)   COPD (chronic obstructive pulmonary disease) (HCC)    [name] 01/09/2016   DM (diabetes mellitus) (HCC)    History of CVA (cerebrovascular accident)    Hypertension    IDA (iron deficiency anemia)    Moderate tobacco use disorder    NICM (nonischemic cardiomyopathy) (HCC)    Nicotine addiction    Peripheral vascular disease (HCC)    Syncope 06/11/2019   Tobacco abuse    Type 2 diabetes mellitus (HCC) 05/14/2016    Past Surgical History:  Procedure Laterality Date   ABDOMINAL AORTOGRAM W/LOWER EXTREMITY N/A 12/07/2022   Procedure: ABDOMINAL AORTOGRAM W/LOWER EXTREMITY;  Surgeon: Leonie Douglas, MD;  Location: [facility];  Service: Cardiovascular;  Laterality: N/A;   ABDOMINAL AORTOGRAM W/LOWER EXTREMITY Left 08/02/2023   Procedure: ABDOMINAL AORTOGRAM W/LOWER EXTREMITY;  Surgeon: Leonie Douglas, MD;  Location: [facility];  Service: Cardiovascular;  Laterality: Left;   BIOPSY  11/12/2020   Procedure: BIOPSY;  Surgeon: Lemar Lofty., MD;  Location: [facility] ENDOSCOPY;  Service: Gastroenterology;;   CARDIAC CATHETERIZATION  10/2010   LM normal, LAD with 20% irregularities, LCX with 20%, RCA with 40% prox and 40% distal - EF of 35%   CATARACT  EXTRACTION, BILATERAL     COLONOSCOPY W/ BIOPSIES AND POLYPECTOMY     COLONOSCOPY WITH PROPOFOL N/A 09/06/2018   Procedure: COLONOSCOPY WITH PROPOFOL;  Surgeon: Tressia Danas, MD;  Location: [facility] ENDOSCOPY;  Service: Gastroenterology;  Laterality: N/A;   ENTEROSCOPY N/A 09/28/2018   Procedure: ENTEROSCOPY;  Surgeon: Jeani Hawking, MD;  Location: [facility] ENDOSCOPY;  Service: Endoscopy;  Laterality: N/A;   ENTEROSCOPY N/A 10/28/2018   Procedure: ENTEROSCOPY;  Surgeon: Tressia Danas, MD;  Location: [facility] ENDOSCOPY;  Service: Gastroenterology;   Laterality: N/A;   ENTEROSCOPY N/A 10/09/2020   Procedure: ENTEROSCOPY;  Surgeon: Hilarie Fredrickson, MD;  Location: [facility] ENDOSCOPY;  Service: Endoscopy;  Laterality: N/A;   ENTEROSCOPY N/A 03/22/2022   Procedure: ENTEROSCOPY;  Surgeon: Benancio Deeds, MD;  Location: [facility] ENDOSCOPY;  Service: Gastroenterology;  Laterality: N/A;   ENTEROSCOPY N/A 05/20/2023   Procedure: ENTEROSCOPY;  Surgeon: Sherrilyn Rist, MD;  Location: [facility] ENDOSCOPY;  Service: Gastroenterology;  Laterality: N/A;   ESOPHAGOGASTRODUODENOSCOPY N/A 11/12/2020   Procedure: ESOPHAGOGASTRODUODENOSCOPY (EGD);  Surgeon: Lemar Lofty., MD;  Location: [facility]OS[facility];  Service: Gastroenterology;  Laterality: N/A;   ESOPHAGOGASTRODUODENOSCOPY (EGD) WITH PROPOFOL N/A 09/05/2018   Procedure: ESOPHAGOGASTRODUODENOSCOPY (EGD) WITH PROPOFOL;  Surgeon: Benancio Deeds, MD;  Location: [facility] ENDOSCOPY;  Service: Gastroenterology;  Laterality: N/A;   ESOPHAGOGASTRODUODENOSCOPY (EGD) WITH PROPOFOL N/A 11/19/2020   Procedure: ESOPHAGOGASTRODUODENOSCOPY (EGD) WITH PROPOFOL;  Surgeon: Beverley Fiedler, MD;  Location: [facility] ENDOSCOPY;  Service: Gastroenterology;  Laterality: N/A;   ESOPHAGOGASTRODUODENOSCOPY (EGD) WITH PROPOFOL N/A 12/27/2022   Procedure: ESOPHAGOGASTRODUODENOSCOPY (EGD) WITH PROPOFOL;  Surgeon: Napoleon Form, MD;  Location: [facility]OSCOPY;  Service: Gastroenterology;  Laterality: N/A;   EXCISION MASS HEAD     GIVENS CAPSULE STUDY N/A 09/06/2018   Procedure: GIVENS CAPSULE STUDY;  Surgeon: Tressia Danas, MD;  Location: [facility] ENDOSCOPY;  Service: Gastroenterology;  Laterality: N/A;   GIVENS CAPSULE STUDY N/A 09/26/2018   Procedure: GIVENS CAPSULE STUDY;  Surgeon: Beverley Fiedler, MD;  Location: [facility] ENDOSCOPY;  Service: Gastroenterology;  Laterality: N/A;   GIVENS CAPSULE STUDY N/A 06/14/2019   Procedure: GIVENS CAPSULE STUDY;  Surgeon: Napoleon Form, MD;  Location: [facility] ENDOSCOPY;  Service: Endoscopy;  Laterality: N/A;   HEMOSTASIS CLIP  PLACEMENT  11/12/2020   Procedure: HEMOSTASIS CLIP PLACEMENT;  Surgeon: Lemar Lofty., MD;  Location: [facility] ENDOSCOPY;  Service: Gastroenterology;;   HEMOSTASIS CONTROL  11/12/2020   Procedure: HEMOSTASIS CONTROL;  Surgeon: Lemar Lofty., MD;  Location: [facility] ENDOSCOPY;  Service: Gastroenterology;;   HOT HEMOSTASIS N/A 10/28/2018   Procedure: HOT HEMOSTASIS (ARGON PLASMA COAGULATION/BICAP);  Surgeon: Tressia Danas, MD;  Location: [facility] ENDOSCOPY;  Service: Gastroenterology;  Laterality: N/A;   HOT HEMOSTASIS N/A 11/12/2020   Procedure: HOT HEMOSTASIS (ARGON PLASMA COAGULATION/BICAP);  Surgeon: Lemar Lofty., MD;  Location: [facility] ENDOSCOPY;  Service: Gastroenterology;  Laterality: N/A;   HOT HEMOSTASIS N/A 03/22/2022   Procedure: HOT HEMOSTASIS (ARGON PLASMA COAGULATION/BICAP);  Surgeon: Benancio Deeds, MD;  Location: [facility] ENDOSCOPY;  Service: Gastroenterology;  Laterality: N/A;   HOT HEMOSTASIS N/A 05/20/2023   Procedure: HOT HEMOSTASIS (ARGON PLASMA COAGULATION/BICAP);  Surgeon: Sherrilyn Rist, MD;  Location: [facility] ENDOSCOPY;  Service: Gastroenterology;  Laterality: N/A;   INCISION AND DRAINAGE PERIRECTAL ABSCESS N/A 06/05/2017   Procedure: IRRIGATION AND DEBRIDEMENT PERIRECTAL ABSCESS;  Surgeon: Andria Meuse, MD;  Location: [facility] OR;  Service: General;  Laterality: N/A;   IR CATHETER TUBE CHANGE  05/14/2023   LAPAROSCOPIC APPENDECTOMY N/A 05/01/2023   Procedure: APPENDECTOMY  LAPAROSCOPIC;  Surgeon: Emelia Loron, MD;  Location: [facility] OR;  Service: General;  Laterality: N/A;   PERIPHERAL VASCULAR BALLOON ANGIOPLASTY Left 08/02/2023   Procedure: PERIPHERAL VASCULAR BALLOON ANGIOPLASTY;  Surgeon: Leonie Douglas, MD;  [facility]: [facility];  Service: Cardiovascular;  Laterality: Left;   PERIPHERAL VASCULAR INTERVENTION  12/07/2022   Procedure: PERIPHERAL VASCULAR INTERVENTION;  Surgeon: Leonie Douglas, MD;  Location: [facility];  Service: Cardiovascular;;    PERIPHERAL VASCULAR INTERVENTION Left 08/02/2023   Procedure: PERIPHERAL VASCULAR INTERVENTION;  Surgeon: Leonie Douglas, MD;  [facility]: [facility];  Service: Cardiovascular;  Laterality: Left;   SUBMUCOSAL TATTOO INJECTION  11/12/2020   Procedure: SUBMUCOSAL TATTOO INJECTION;  Surgeon: Lemar Lofty., MD;  Location: [facility]OSCOPY;  Service: Gastroenterology;;   SUBMUCOSAL TATTOO INJECTION  05/20/2023   Procedure: SUBMUCOSAL TATTOO INJECTION;  Surgeon: Sherrilyn Rist, MD;  Location: [facility] ENDOSCOPY;  Service: Gastroenterology;;   VIDEO BRONCHOSCOPY Bilateral 05/08/2016   Procedure: VIDEO BRONCHOSCOPY WITH FLUORO;  Surgeon: Oretha Milch, MD;  Location: [facility] ENDOSCOPY;  Service: Cardiopulmonary;  Laterality: Bilateral;    Prior to Admission medications   Medication Sig Start Date End Date Taking? Authorizing Provider  acetaminophen (TYLENOL) 500 MG tablet Take 2 tablets (1,000 mg total) by mouth every 8 (eight) hours as needed for moderate pain. 01/03/23  Yes [name], MD  albuterol (PROVENTIL) (2.5 MG/3ML) 0.083% nebulizer solution Take 3 mLs (2.5 mg total) by nebulization every 2 (two) hours as needed for wheezing or shortness of breath. Patient taking differently: Take 2.5 mg by nebulization every 4 (four) hours as needed for wheezing or shortness of breath. 05/21/23  Yes Ghimire, Werner Lean, MD  albuterol (VENTOLIN HFA) 108 (90 Base) MCG/ACT inhaler INHALE 2 PUFFS INTO THE LUNGS EVERY 6 (SIX) HOURS AS NEEDED FOR WHEEZING OR SHORTNESS OF BREATH. 05/21/23  Yes Ghimire, Werner Lean, MD  aspirin EC 81 MG tablet Take 81 mg by mouth daily. Swallow whole.   Yes [provider]  atorvastatin (LIPITOR) 80 MG tablet Take 1 tablet (80 mg total) by mouth daily. 02/04/23  Yes [name], MD  Budeson-Glycopyrrol-Formoterol (BREZTRI AEROSPHERE) 160-9-4.8 MCG/ACT AERO Take 2 puffs first thing in morning and then another 2 puffs about 12 hours later. Patient taking differently:  Inhale 2 puffs into the lungs in the morning and at bedtime. 08/15/23  Yes Nyoka Cowden, MD  clopidogrel (PLAVIX) 75 MG tablet Take 1 tablet (75 mg total) by mouth daily. 08/02/23  Yes Leonie Douglas, MD  DULoxetine (CYMBALTA) 60 MG capsule Take 1 capsule (60 mg total) by mouth daily. For chronic leg pains 06/06/23  Yes Marcine Matar, MD  ergocalciferol (DRISDOL) 1.25 MG (50000 UT) capsule Take 1 capsule (50,000 Units total) by mouth once a week. 06/10/23  Yes [name], MD  ezetimibe (ZETIA) 10 MG tablet Take 1 tablet (10 mg total) by mouth daily. 06/06/23  Yes Pricilla Riffle, MD  ferrous sulfate (FEROSUL) 325 (65 FE) MG tablet Take 1 tablet (325 mg total) by mouth 2 (two) times daily with a meal. 05/21/23  Yes Ghimire, Werner Lean, MD  folic acid (FOLVITE) 1 MG tablet Take 1 tablet (1 mg total) by mouth daily. 01/30/23  Yes [name], MD  furosemide (LASIX) 40 MG tablet Take 1 tablet (40 mg total) by mouth 2 (two) times daily. 08/02/23  Yes [name], MD  gabapentin (NEURONTIN) 300 MG capsule Take 1 capsule (300 mg total) by mouth  2 (two) times daily. 06/10/23  Yes [name], MD  hydrALAZINE (APRESOLINE) 50 MG tablet Take 1 tablet (50 mg total) by mouth every 8 (eight) hours. Patient taking differently: Take 50 mg by mouth daily. 02/04/23  Yes [name], MD  isosorbide mononitrate (IMDUR) 30 MG 24 hr tablet Take 1 tablet (30 mg total) by mouth daily. 06/10/23  Yes [name], MD  metoprolol succinate (TOPROL-XL) 50 MG 24 hr tablet Take 1.5 tablets (75 mg total) by mouth daily. Take with or immediately following a meal. Patient taking differently: Take 50 mg by mouth daily. 05/21/23  Yes Ghimire, Werner Lean, MD  Multiple Vitamin (MULTIVITAMIN WITH MINERALS) TABS tablet Take 1 tablet by mouth daily. 05/22/23  Yes Ghimire, Werner Lean, MD  nitroGLYCERIN (NITROSTAT) 0.4 MG SL tablet Place 0.4 mg under the tongue every 5 (five) minutes as needed for chest pain.   Yes  [provider]  OXYGEN Inhale 2 L/min into the lungs continuous.   Yes [provider]  pantoprazole (PROTONIX) 40 MG tablet Take 1 tablet (40 mg total) by mouth daily. 06/25/23  Yes [name], MD  rivaroxaban (XARELTO) 20 MG TABS tablet Take 1 tablet (20 mg total) by mouth daily with supper. 06/06/23  Yes Marcine Matar, MD  TUDORZA PRESSAIR 400 MCG/ACT AEPB INHALE 1 PUFF INTO THE LUNGS IN THE MORNING AND AT BEDTIME. 03/29/20 04/04/20  Nyoka Cowden, MD    Current Facility-Administered Medications  Medication Dose Route Frequency Provider Last Rate Last Admin   [facility] Hold] 0.9 %  sodium chloride infusion (Manually program via Guardrails IV Fluids)   Intravenous Once Shitarev, Dimitry, MD       [MAR Hold] acetaminophen (TYLENOL) tablet 1,000 mg  1,000 mg Oral Q6H Jerre Simon, MD   1,000 mg at 08/23/23 1801   [MAR Hold] atorvastatin (LIPITOR) tablet 80 mg  80 mg Oral Daily Everhart, Kirstie, DO   80 mg at 08/23/23 1006   [MAR Hold] DULoxetine (CYMBALTA) DR capsule 60 mg  60 mg Oral Daily Everhart, Kirstie, DO   60 mg at 08/23/23 1006   [MAR Hold] ezetimibe (ZETIA) tablet 10 mg  10 mg Oral Daily Everhart, Kirstie, DO   10 mg at 08/23/23 1006   [MAR Hold] fluticasone furoate-vilanterol (BREO ELLIPTA) 100-25 MCG/ACT 1 puff  1 puff Inhalation Daily Paytes, Austin A, RPH   1 puff at 08/23/23 0824   [MAR Hold] gabapentin (NEURONTIN) capsule 300 mg  300 mg Oral BID Everhart, Kirstie, DO   300 mg at 08/23/23 2130   [MAR Hold] metoprolol tartrate (LOPRESSOR) tablet 25 mg  25 mg Oral BID Jerre Simon, MD   25 mg at 08/23/23 2130   [facility] Hold] nicotine (NICODERM CQ - dosed in mg/24 hours) patch 14 mg  14 mg Transdermal Daily Everhart, Kirstie, DO   14 mg at 08/23/23 1007   [MAR Hold] pantoprazole (PROTONIX) injection 40 mg  40 mg Intravenous Q12H Everhart, Kirstie, DO   40 mg at 08/23/23 2130   [MAR Hold] umeclidinium bromide (INCRUSE ELLIPTA) 62.5 MCG/ACT 1 puff  1 puff  Inhalation Daily Paytes, Austin A, RPH   1 puff at 08/23/23 0824    Allergies as of 08/22/2023 - Review Complete 08/22/2023  Allergen Reaction Noted   Lisinopril Cough 06/29/2014    Family History  Problem Relation Age of Onset   Heart disease Mother    Diabetes Mother    Colon cancer Mother    Liver cancer Mother  Cancer Father        type unknown   Diabetes Sister        x 2   Diabetes Brother     Social History   Socioeconomic History   Marital status: Single    Spouse name: Not on file   Number of children: 1   Years of education: Not on file   Highest education level: 12th grade  Occupational History   Occupation: retired  Tobacco Use   Smoking status: Every Day    Current packs/day: 0.50    Average packs/day: 0.5 packs/day for 47.0 years (23.5 ttl pk-yrs)    Types: Cigarettes   Smokeless tobacco: Never   Tobacco comments:    4/5 cigs  Vaping Use   Vaping status: Never Used  Substance and Sexual Activity   Alcohol use: Not Currently    Alcohol/week: 0.0 standard drinks of alcohol    Comment: last drink was before xmas   Drug use: No    Types: Cocaine, Marijuana    Comment: "nothing in 20 years"   Sexual activity: Not Currently  Other Topics Concern   Not on file  Social History Narrative   unemployed   Social Drivers of Corporate investment banker Strain: Low Risk  (06/10/2023)   Overall Financial Resource Strain (CARDIA)    Difficulty of Paying Living Expenses: Not hard at all  Food Insecurity: No Food Insecurity (08/22/2023)   Hunger Vital Sign    Worried About Running Out of Food in the Last Year: Never true    Ran Out of Food in the Last Year: Never true  Recent Concern: Food Insecurity - Food Insecurity Present (06/10/2023)   Hunger Vital Sign    Worried About Running Out of Food in the Last Year: Sometimes true    Ran Out of Food in the Last Year: Never true  Transportation Needs: No Transportation Needs (08/22/2023)   PRAPARE -  Administrator, Civil Service (Medical): No    Lack of Transportation (Non-Medical): No  Physical Activity: Insufficiently Active (06/10/2023)   Exercise Vital Sign    Days of Exercise per Week: 3 days    Minutes of Exercise per Session: 20 min  Stress: No Stress Concern Present (06/10/2023)   [employer] of Occupational Health - Occupational Stress Questionnaire    Feeling of Stress : Not at all  Social Connections: Moderately Isolated (08/22/2023)   Social Connection and Isolation Panel [NHANES]    Frequency of Communication with Friends and Family: More than three times a week    Frequency of Social Gatherings with Friends and Family: Twice a week    Attends Religious Services: Never    Database administrator or Organizations: Yes    Attends Banker Meetings: 1 to 4 times per year    Marital Status: Divorced  Catering manager Violence: Not At Risk (08/22/2023)   Humiliation, Afraid, Rape, and Kick questionnaire    Fear of Current or Ex-Partner: No    Emotionally Abused: No    Physically Abused: No    Sexually Abused: No    Review of Systems:  All other review of systems negative except as mentioned in the HPI.  Physical Exam: Vital signs BP 129/70   Pulse 65   Temp 97.8 F (36.6 C) (Temporal)   Resp 20   Ht 6\' 3"  (1.905 m)   Wt 90.7 kg   SpO2 99%   BMI 25.00 kg/m   General:  Alert,  Well-developed, well-nourished, pleasant and cooperative in NAD Lungs:  Clear throughout to auscultation.   Heart:  Regular rate and rhythm; no murmurs, clicks, rubs,  or gallops. Abdomen:  Soft, mild tenderness to palpation in the right upper quadrant and nondistended. Normal bowel sounds.   Neuro/Psych:  Normal mood and affect. A and O x 3  Maren Beach, MD [facility]

## 2023-08-24 NOTE — Assessment & Plan Note (Addendum)
 Chronic issue for patient, still present and unchanged the past 3 weeks. -Gabapentin 300 mg BID -Acetaminophen 1000 mg Q6h SCH -ABI LLE -Consider vascular consult if ABI abnormal

## 2023-08-24 NOTE — Plan of Care (Signed)

## 2023-08-24 NOTE — Progress Notes (Signed)
 PT Cancellation Note  Patient Details Name: Jared [name] MRN: [id number] DOB: 1956-04-08   Cancelled Treatment:    Reason Eval/Treat Not Completed: Other (comment). Pt reports he is too fatigued and dyspneic to have PT. Will continue attempts.   Angelina Ok [facility] 08/24/2023, 11:39 AM Skip Mayer PT Acute Rehabilitation Services Office [phone]

## 2023-08-24 NOTE — Transfer of Care (Signed)
 Immediate Anesthesia Transfer of Care Note  Patient: Jared [name]  Procedure(s) Performed: ENTEROSCOPY EGD, WITH ARGON PLASMA COAGULATION  Patient Location: PACU  Anesthesia Type:MAC  Level of Consciousness: sedated, drowsy, patient cooperative, and responds to stimulation  Airway & Oxygen Therapy: Patient Spontanous Breathing and Patient connected to nasal cannula oxygen  Post-op Assessment: Report given to RN and Post -op Vital signs reviewed and stable  Post vital signs: Reviewed and stable  Last Vitals:  Vitals Value Taken Time  BP 114/72 08/24/23 0823  Temp 98.2 08/24/23 0823  Pulse 85 08/24/23 0823  Resp 22 08/24/23 0823  SpO2 90 % 08/24/23 0823  Vitals shown include unfiled device data.   Patients Stated Pain Goal: 0 (08/23/23 1801)  Complications: No notable events documented.

## 2023-08-24 NOTE — Op Note (Signed)
 [facility] Patient Name: Jared [name] Procedure Date : 08/24/2023 MRN: [id number] Attending MD: Maren Beach , MD, [id number] Date of Birth: 1955/10/25 CSN: [id number] Age: 68 Admit Type: Inpatient Procedure:                Small bowel enteroscopy Indications:              Iron deficiency anemia, Follow-up of angioectasia Providers:                Maren Beach, MD, Margaree Mackintosh, RN,                            Brion Aliment, Technician Referring MD:             Bonner Puna. Manson Passey Medicines:                Monitored Anesthesia Care Complications:            No immediate complications. Estimated blood loss:                            Minimal. Estimated Blood Loss:     Estimated blood loss was minimal. Procedure:                Pre-Anesthesia Assessment:                           - Prior to the procedure, a History and Physical                            was performed, and patient medications and                            allergies were reviewed. The patient's tolerance of                            previous anesthesia was also reviewed. The risks                            and benefits of the procedure and the sedation                            options and risks were discussed with the patient.                            All questions were answered, and informed consent                            was obtained. Prior Anticoagulants: The patient has                            taken Xarelto, last dose was 3 days prior to                            procedure; last dose of Plavix 2 days prior to  procedure ASA Grade Assessment: III - A patient                            with severe systemic disease. After reviewing the                            risks and benefits, the patient was deemed in                            satisfactory condition to undergo the procedure.                           After obtaining informed consent, the endoscope was                             passed under direct vision. Throughout the                            procedure, the patient's blood pressure, pulse, and                            oxygen saturations were monitored continuously. The                            [id number]-HQ190TL ([id number] Olympus peds colonoscope was                            introduced through the mouth and advanced to the                            proximal jejunum. The small bowel enteroscopy was                            accomplished without difficulty. The patient                            tolerated the procedure well. Scope In: Scope Out: Findings:      The upper third of the esophagus and middle third of the esophagus were       normal.      The Z-line was irregular at the GE junction. Mucosal islands were       present. The appearance of the GE junction and irregular Z-line was       similar to previous endoscopic evaluations. No biopsies performed in the       setting of recent bleeding.      The gastric body, gastric antrum, cardia (on retroflexion) and gastric       fundus (on retroflexion) were normal. There was no evidence of active       bleeding or stigmata of recent bleeding in the stomach.      Two angioectasias with no bleeding were found in the duodenal bulb.       Coagulation for tissue destruction using argon plasma at 2 liters/minute       and 20 watts was successful.      There was no evidence of significant pathology  in the second portion of       the duodenum, in the third portion of the duodenum and in the fourth       portion of the duodenum. There was no evidence of active bleeding or       stigmata of recent bleeding in the duodenum.      A tattoo was seen in the proximal jejunum previously placed during small       bowel enteroscopy 05/2023.      A single angioectasia with no bleeding was found in the proximal jejunum       approximately 10 to 15 cm distal to the tattoo. Coagulation for tissue        destruction using argon plasma at 2 liters/minute and 20 watts was       successful. There was no evidence of active bleeding or stigmata of       recent bleeding in the jejunum. Impression:               - Normal upper third of esophagus and middle third                            of esophagus.                           - Z-line irregular -appearance suspicious for                            Barrett's esophagus and similar to prior endoscopic                            evaluations.                           - Normal gastric body, antrum, cardia and gastric                            fundus.                           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                            Treated with argon plasma coagulation (APC).                           - Normal second portion of the duodenum, third                            portion of the duodenum and fourth portion of the                            duodenum. No evidence of active bleeding or                            stigmata of recent bleeding in the duodenum.                           -  A tattoo was seen in the jejunum placed during                            previous enteroscopy 05/2003.                           - A single non-bleeding angioectasia in the jejunum                            located 10 to 15 cm distal to tattoo.. Treated with                            argon plasma coagulation (APC). No evidence of                            active bleeding or stigmata of recent bleeding in                            the jejunum.                           - No specimens collected. Recommendation:           - Return patient to hospital ward for ongoing care                           - Continue holding anticoagulants today and if                            clinically stable without decline in hemoglobin                            consider resumption tomorrow 08/25/2023                           - Continue to follow serial H&H  every 8-12 hours                           - If there is evidence of continued drop in                            hemoglobin despite today's procedure can reevaluate                            need for video capsule endoscopy Procedure Code(s):        --- Professional ---                           [phone], Small intestinal endoscopy, enteroscopy                            beyond second portion of duodenum, not including                            ileum; with ablation of tumor(s),  polyp(s), or                            other lesion(s) not amenable to removal by hot                            biopsy forceps, bipolar cautery or snare technique Diagnosis Code(s):        --- Professional ---                           K22.89, Other specified disease of esophagus                           K31.819, Angiodysplasia of stomach and duodenum                            without bleeding                           K55.20, Angiodysplasia of colon without hemorrhage                           D50.9, Iron deficiency anemia, unspecified CPT copyright 2022 [employer]. All rights reserved. The codes documented in this report are preliminary and upon coder review may  be revised to meet current compliance requirements. Maren Beach, MD 08/24/2023 8:41:10 AM This report has been signed electronically. Number of Addenda: 0

## 2023-08-24 NOTE — Assessment & Plan Note (Addendum)
 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  Now s/p 3 units PRBC total. -Continue H&H Q8h. AM CBC -Transfusion threshold Hgb <8 given CAD -Continue pantoprazole 40 mg IV Q12h -PT/OT eval and treat -Anticipate EGD this AM, NPO since MN, follow up GI recommendations after

## 2023-08-24 NOTE — Assessment & Plan Note (Addendum)
 Regular rhythm this AM with normal rate, likely intermittently in and out of Afib, continue with current management. -Metoprolol tartrate 25 mg BID instead of home succinate 50 mg; monitor BP closely -Holding anticoagulation at this time due to suspected acute bleeding -Renew cardiac monitoring

## 2023-08-24 NOTE — Assessment & Plan Note (Signed)
 Appears chronic, likely reactive thrombocytosis in the setting of IDA. -Trend with AM CBC

## 2023-08-24 NOTE — Plan of Care (Signed)
 Patient AAOx4. NC2L in place. Urinal at bedside. LBM today, urinal at bedside. Scheduled tylenol given with slight effect, pain to throat s/p EGD and legs. Safety precautions maintained.    Problem: Education: Goal: Knowledge of General Education information will improve Description: Including pain rating scale, medication(s)/side effects and non-pharmacologic comfort measures Outcome: Progressing   Problem: Health Behavior/Discharge Planning: Goal: Ability to manage health-related needs will improve Outcome: Progressing   Problem: Activity: Goal: Risk for activity intolerance will decrease Outcome: Progressing

## 2023-08-24 NOTE — Progress Notes (Addendum)
 Daily Progress Note Intern Pager: [phone]  Patient name: Jared [name] Medical record number: [id number] Date of birth: 1956/05/07 Age: 68 y.o. Gender: male  Primary Care Provider: [name], MD Consultants: GI, Vascular Surgery Code Status: FULL  Pt Overview and Major Events to Date:  3/13 - Admitted, 2 units PRBC 3/14 - 1 unit PRBC 3/15 - EGD  Assessment and Plan: Jared [name] is a 68 y.o. male with a pertinent PMH of AVMs, Afib on Xarelto, CAD, HFpEF, CVA, IDA, PVD, and COPD who presented with progressive onset dyspnea and generalized weakness and was admitted for symptomatic anemia suspected 2/2 acute blood loss from AVM, currently awaiting EGD.  Patient continues to require transfusions with gradual downtrend in hemoglobin suggesting GI bleed.  Awaiting AM Hgb check, will transfuse as needed.  Undergoing EGD today, anticipate AVM treatment, will follow results and recs.  Notably, CTAP yesterday resulted with no acute pathology.  PT/OT ordered for after procedure. 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  Now s/p 3 units PRBC total. -Continue H&H Q8h. AM CBC -Transfusion threshold Hgb <8 given CAD -Continue pantoprazole 40 mg IV Q12h -PT/OT eval and treat -Anticipate EGD this AM, NPO since MN, follow up GI recommendations after Left leg pain Chronic issue for patient, still present and unchanged the past 3 weeks. -Gabapentin 300 mg BID -Acetaminophen 1000 mg Q6h SCH -ABI LLE -Consider vascular consult if ABI abnormal PAF (paroxysmal atrial fibrillation) (HCC) Regular rhythm this AM with normal rate, likely intermittently in and out of Afib, continue with current management. -Metoprolol tartrate 25 mg BID instead of home succinate 50 mg; monitor BP closely -Holding anticoagulation at this time due to suspected acute bleeding -Renew cardiac  monitoring Thrombocytosis Appears chronic, likely reactive thrombocytosis in the setting of IDA. -Trend with AM CBC Chronic health problem COPD - Continue Breztri formulary equivalent (Incruse Ellipta) Chronic Leg pain - Continue duloxetine 60 mg daily, gabapentin 300 mg BID HLD, CAD - Continue ezetimibe 10 mg daily, atorvastatin 80 mg daily Tobacco use - 14 mg nicotine patch PRN GERD - On home pantoprazole 40 mg daily, now on 40 mg IV BID as above  FEN/GI: NPO pending likely EGD PPx: SCDs, holding chemical PPx given bleeding risk Dispo: Pending PT recommendations  and pending GI workup.  Subjective: This morning, patient is doing well.  Some dyspnea with ambulation and mild abdominal pain.  Objective: Temp:  [97.3 F (36.3 C)-98.6 F (37 C)] 98.2 F (36.8 C) (03/14 2038) Pulse Rate:  [71-95] 71 (03/14 2038) Resp:  [16-19] 17 (03/14 2038) BP: (108-128)/(64-78) 119/64 (03/14 2038) SpO2:  [96 %-100 %] 97 % (03/14 2038)  Physical Exam: General: Chronically ill, resting comfortably in bed, NAD, alert and at baseline. Cardiovascular: Regular rate and rhythm. Normal S1/S2. No murmurs, rubs, or gallops appreciated. 2+ radial pulses. Pulmonary: Clear bilaterally to ascultation. No increased WOB on 2 L [place].  No wheezes, crackles, or rhonchi. Abdominal: TTP throughout abdomen, most notably in LLQ.  No rebound or guarding.  Normoactive bowel sounds, nondistended. Skin: Warm and dry. Extremities: Trace peripheral edema bilaterally.  Capillary refill 2-3 seconds.  Laboratory: Most recent CBC Lab Results  Component Value Date   WBC 7.9 08/23/2023   HGB 8.0 (L) 08/23/2023   HCT 27.5 (L) 08/23/2023   MCV 87.4 08/23/2023   PLT 487 (H) 08/23/2023   Most recent BMP    Latest  Ref Rng & Units 08/23/2023    7:40 AM  BMP  Glucose 70 - 99 mg/dL 409   BUN 8 - 23 mg/dL 11   Creatinine 8.11 - 1.24 mg/dL 9.14   Sodium 782 - 956 mmol/L 140   Potassium 3.5 - 5.1 mmol/L 4.2   Chloride 98 - 111  mmol/L 100   CO2 22 - 32 mmol/L 31   Calcium 8.9 - 10.3 mg/dL 8.6     Other pertinent labs:    Latest Ref Rng & Units 08/23/2023    6:57 PM 08/23/2023   11:41 AM 08/23/2023    7:40 AM  CBC  WBC 4.0 - 10.5 K/uL   7.9   Hemoglobin 13.0 - 17.0 g/dL 8.0  7.5  7.4   Hematocrit 39.0 - 52.0 % 27.5  25.6  25.6   Platelets 150 - 400 K/uL   487     New Imaging/Diagnostic Tests: -CTAP 3/14: Previous appendectomy, 3 mm nonobstructing L kidney stone, chronic lumbar degen changes. -ABI: Pending  Kateleen Encarnacion, MD 08/24/2023, 1:13 AM  PGY-1, [facility] FPTS Intern pager: [phone], text pages welcome Secure chat group [facility] Teaching Service

## 2023-08-24 NOTE — Progress Notes (Signed)
 PT Cancellation Note  Patient Details Name: Jared [name] MRN: [id number] DOB: 1956/05/16   Cancelled Treatment:    Reason Eval/Treat Not Completed: Patient at procedure or test/unavailable.   Angelina Ok [facility] 08/24/2023, 7:58 AM Skip Mayer PT Acute [employer] [phone]

## 2023-08-25 ENCOUNTER — Encounter (HOSPITAL_COMMUNITY)

## 2023-08-25 DIAGNOSIS — E1169 Type 2 diabetes mellitus with other specified complication: Secondary | ICD-10-CM | POA: Diagnosis not present

## 2023-08-25 DIAGNOSIS — K31819 Angiodysplasia of stomach and duodenum without bleeding: Secondary | ICD-10-CM

## 2023-08-25 DIAGNOSIS — D500 Iron deficiency anemia secondary to blood loss (chronic): Secondary | ICD-10-CM | POA: Diagnosis not present

## 2023-08-25 DIAGNOSIS — D649 Anemia, unspecified: Secondary | ICD-10-CM | POA: Diagnosis not present

## 2023-08-25 DIAGNOSIS — D75839 Thrombocytosis, unspecified: Secondary | ICD-10-CM | POA: Diagnosis not present

## 2023-08-25 LAB — BASIC METABOLIC PANEL
Anion gap: 10 (ref 5–15)
BUN: 12 mg/dL (ref 8–23)
CO2: 31 mmol/L (ref 22–32)
Calcium: 8.5 mg/dL — ABNORMAL LOW (ref 8.9–10.3)
Chloride: 102 mmol/L (ref 98–111)
Creatinine, Ser: 0.77 mg/dL (ref 0.61–1.24)
GFR, Estimated: >60 mL/min (ref >60)
Glucose, Bld: 95 mg/dL (ref 70–99)
Potassium: 4.3 mmol/L (ref 3.5–5.1)
Sodium: 143 mmol/L (ref 135–145)

## 2023-08-25 LAB — CBC
HCT: 28.5 % — ABNORMAL LOW (ref 39.0–52.0)
Hemoglobin: 8.3 g/dL — ABNORMAL LOW (ref 13.0–17.0)
MCH: 25.9 pg — ABNORMAL LOW (ref 26.0–34.0)
MCHC: 29.1 g/dL — ABNORMAL LOW (ref 30.0–36.0)
MCV: 88.8 fL (ref 80.0–100.0)
Platelets: 429 10*3/uL — ABNORMAL HIGH (ref 150–400)
RBC: 3.21 MIL/uL — ABNORMAL LOW (ref 4.22–5.81)
RDW: 16.9 % — ABNORMAL HIGH (ref 11.5–15.5)
WBC: 8.3 10*3/uL (ref 4.0–10.5)
nRBC: 0.2 % (ref 0.0–0.2)

## 2023-08-25 LAB — HEMOGLOBIN AND HEMATOCRIT, BLOOD
HCT: 28 % — ABNORMAL LOW (ref 39.0–52.0)
HCT: 29.1 % — ABNORMAL LOW (ref 39.0–52.0)
Hemoglobin: 8.2 g/dL — ABNORMAL LOW (ref 13.0–17.0)
Hemoglobin: 8.4 g/dL — ABNORMAL LOW (ref 13.0–17.0)

## 2023-08-25 MED ORDER — RIVAROXABAN 10 MG PO TABS
20.0000 mg | ORAL_TABLET | Freq: Every day | ORAL | Status: DC
  Administered 2023-08-25: 20 mg via ORAL
  Filled 2023-08-25: qty 2

## 2023-08-25 MED ORDER — FERROUS SULFATE 325 (65 FE) MG PO TABS
325.0000 mg | ORAL_TABLET | ORAL | Status: DC
  Administered 2023-08-25: 325 mg via ORAL
  Filled 2023-08-25: qty 1

## 2023-08-25 NOTE — Evaluation (Signed)
 Occupational Therapy Evaluation Patient Details Name: Jared [name] MRN: [id number] DOB: 11-07-1955 Today's Date: 08/25/2023   History of Present Illness   Pt is 68 year old presented [facility] on  08/22/23 for acute blood loss anemia. PMH - perforated appendicitis, syncope, AVM, CABG, CAD, CAP, CHF, COPD on home O2, CVA, HTN, A flutter, DM2     Clinical Impressions Patient admitted for the diagnosis above.  PTA he lives alone, Ind with ADL and iADL woth O2 and 4WRW.  Family assist with community mobility.  Currently he presents with decreased balance and fair activity tolerance.  Overall he is needing supervision to CGA for ADL and in room mobility.  OT will follow in the acute setting to address deficits, and no post acute OT is anticipated.       If plan is discharge home, recommend the following:   Assist for transportation     Functional Status Assessment   Patient has had a recent decline in their functional status and demonstrates the ability to make significant improvements in function in a reasonable and predictable amount of time.     Equipment Recommendations   None recommended by OT     Recommendations for Other Services         Precautions/Restrictions   Precautions Precautions: Fall Restrictions Weight Bearing Restrictions Per Provider Order: No     Mobility Bed Mobility Overal bed mobility: Modified Independent               Patient Response: Cooperative  Transfers Overall transfer level: Needs assistance Equipment used: Rolling walker (2 wheels) Transfers: Sit to/from Stand, Bed to chair/wheelchair/BSC Sit to Stand: Supervision     Step pivot transfers: Supervision, Contact guard assist            Balance Overall balance assessment: Mild deficits observed, not formally tested                                         ADL either performed or assessed with clinical judgement   ADL       Grooming:  Supervision/safety;Standing               Lower Body Dressing: Contact guard assist;Sit to/from stand   Toilet Transfer: Contact guard assist;Regular Toilet;Rolling walker (2 wheels);Ambulation                   Vision Patient Visual Report: No change from baseline       Perception Perception: Not tested       Praxis Praxis: Not tested       Pertinent Vitals/Pain Pain Assessment Pain Assessment: No/denies pain     Extremity/Trunk Assessment Upper Extremity Assessment Upper Extremity Assessment: Overall WFL for tasks assessed   Lower Extremity Assessment Lower Extremity Assessment: Defer to PT evaluation   Cervical / Trunk Assessment Cervical / Trunk Assessment: Normal   Communication Communication Communication: No apparent difficulties   Cognition Arousal: Alert Behavior During Therapy: WFL for tasks assessed/performed Cognition: No apparent impairments                               Following commands: Intact       Cueing  General Comments       VSS on supplemental O2   Exercises     Shoulder Instructions  Home Living Family/patient expects to be discharged to:: Private residence Living Arrangements: Alone   Type of Home: House Home Access: Stairs to enter [employer] of Steps: 4 Entrance Stairs-Rails: Can reach both;Left;Right Home Layout: One level     Bathroom Shower/Tub: Chief Strategy Officer: Standard     Home Equipment: Rollator (4 wheels);Rolling Walker (2 wheels);Shower seat;Grab bars - tub/shower;BSC/3in1   Additional Comments: 2L O2 all times      Prior Functioning/Environment Prior Level of Function : Independent/Modified Independent;History of Falls (last six months)             Mobility Comments: Rollator in community, no AD in home. Pt reports falls after experiencing syncope. Pt can usually tell when these episodes will happen ADLs Comments: Mod I with ADL/IADLs,  sister or medicaid provides transportation    OT Problem List: Decreased activity tolerance;Impaired balance (sitting and/or standing)   OT Treatment/Interventions: Self-care/ADL training;Therapeutic activities;DME and/or AE instruction;Patient/family education;Balance training      OT Goals(Current goals can be found in the care plan section)   Acute Rehab OT Goals Patient Stated Goal: Return home OT Goal Formulation: With patient Time For Goal Achievement: 09/09/23 Potential to Achieve Goals: Good ADL Goals Pt Will Perform Grooming: with modified independence;standing Pt Will Perform Lower Body Dressing: with modified independence;sit to/from stand Pt Will Transfer to Toilet: with modified independence;ambulating;regular height toilet   OT Frequency:  Min 1X/week    Co-evaluation              AM-PAC OT "6 Clicks" Daily Activity     Outcome Measure Help from another person eating meals?: None Help from another person taking care of personal grooming?: A Little Help from another person toileting, which includes using toliet, bedpan, or urinal?: A Little Help from another person bathing (including washing, rinsing, drying)?: A Little Help from another person to put on and taking off regular upper body clothing?: None Help from another person to put on and taking off regular lower body clothing?: A Little 6 Click Score: 20   End of Session Equipment Utilized During Treatment: Rolling walker (2 wheels);Oxygen Nurse Communication: Mobility status  Activity Tolerance: Patient tolerated treatment well Patient left: in bed;with call bell/phone within reach  OT Visit Diagnosis: Unsteadiness on feet (R26.81)                Time: 1150-1210 OT Time Calculation (min): 20 min Charges:  OT General Charges $OT Visit: 1 Visit OT Evaluation $OT Eval Moderate Complexity: 1 Mod  08/25/2023  RP, OTR/L  Acute Rehabilitation Services  Office:  [phone]   Suzanna Obey 08/25/2023, 12:22 PM

## 2023-08-25 NOTE — Assessment & Plan Note (Addendum)
 Stable in the 400s.  Likely reactive in the setting of iron deficiency anemia. - AM CBC

## 2023-08-25 NOTE — Progress Notes (Signed)
 Progress Note   Subjective  Chief Complaint: Symptomatic anemia and small bowel angioectasias  -- No acute events overnight -- Hemoglobin currently stable at 8.2-previously 8.3 -- No overt bleeding    Objective   Vital signs in last 24 hours: Temp:  [97.8 F (36.6 C)-98.3 F (36.8 C)] 97.8 F (36.6 C) (03/16 0429) Pulse Rate:  [63-77] 76 (03/16 0842) Resp:  [17-18] 17 (03/16 0429) BP: (95-119)/(59-75) 117/66 (03/16 0842) SpO2:  [96 %-99 %] 99 % (03/16 0842) Last BM Date : 08/24/23 General:    Resting quietly in bed in no distress Heart:  Regular rate and rhythm; no murmurs Lungs: Respirations even and unlabored, lungs CTA bilaterally Abdomen:  Soft, nontender and nondistended. Normal bowel sounds. Extremities:  Without edema. Neurologic:  Alert and oriented,  grossly normal neurologically. Psych:  Cooperative. Normal mood and affect.  Intake/Output from previous day: 03/15 0701 - 03/16 0700 In: 250 [I.V.:250] Out: 550 [Urine:550] Intake/Output this shift: No intake/output data recorded.  Lab Results: Recent Labs    08/23/23 0740 08/23/23 1141 08/24/23 0646 08/24/23 1233 08/24/23 1941 08/25/23 0704 08/25/23 0735  WBC 7.9  --  7.8  --   --  8.3  --   HGB 7.4*   < > 8.5*   < > 8.3* 8.3* 8.2*  HCT 25.6*   < > 28.8*   < > 28.0* 28.5* 28.0*  PLT 487*  --  452*  --   --  429*  --    < > = values in this interval not displayed.   BMET Recent Labs    08/23/23 0740 08/24/23 0646 08/25/23 0704  NA 140 139 143  K 4.2 4.1 4.3  CL 100 99 102  CO2 31 34* 31  GLUCOSE 114* 105* 95  BUN 11 13 12   CREATININE 0.81 0.80 0.77  CALCIUM 8.6* 8.8* 8.5*   LFT No results for input(s): "PROT", "ALBUMIN", "AST", "ALT", "ALKPHOS", "BILITOT", "BILIDIR", "IBILI" in the last 72 hours. PT/INR No results for input(s): "LABPROT", "INR" in the last 72 hours.  Studies/Results: CT ABDOMEN PELVIS W CONTRAST Result Date: 08/23/2023 CLINICAL DATA:  Abdominal pain, acute,  nonlocalized. Previous appendicitis with abscess and drainage. EXAM: CT ABDOMEN AND PELVIS WITH CONTRAST TECHNIQUE: Multidetector CT imaging of the abdomen and pelvis was performed using the standard protocol following bolus administration of intravenous contrast. RADIATION DOSE REDUCTION: This exam was performed according to the departmental dose-optimization program which includes automated exposure control, adjustment of the mA and/or kV according to patient size and/or use of iterative reconstruction technique. CONTRAST:  75mL OMNIPAQUE IOHEXOL 350 MG/ML SOLN COMPARISON:  05/20/2023 FINDINGS: Lower chest: Cardiomegaly. Lung bases are clear. No pleural effusion. Hepatobiliary: Liver parenchyma is normal.  No calcified gallstones. Pancreas: Normal Spleen: Normal Adrenals/Urinary Tract: Adrenal glands are normal. Right kidney is normal other than vascular calcification. Left kidney contains a 3 mm nonobstructing stone in the midportion. There is vascular calcification on this side as well. Bladder is normal. Stomach/Bowel: Stomach and small intestine are normal. Previous appendectomy. Drain has been removed. There is no longer any inflammatory change seen in the region. The appearance is satisfactory. Beyond that the colon is normal. Vascular/Lymphatic: Aortic atherosclerosis. No aneurysm. IVC is normal. No adenopathy. Reproductive: Normal Other: No free fluid or air. Musculoskeletal: Chronic lower lumbar degenerative changes. IMPRESSION: 1. Previous appendectomy. Drain has been removed. There is no longer any inflammatory change seen in the region. The appearance is satisfactory. 2. 3 mm nonobstructing stone in the  midportion of the left kidney. 3. Aortic atherosclerosis. 4. Chronic lower lumbar degenerative changes. Electronically Signed   By: Paulina Fusi M.D.   On: 08/23/2023 21:33   SBE 08/24/23    Assessment / Plan:    68 year old African-American male admitted with weakness, shortness of breath,  symptomatic anemia hemoglobin 6.1 down from 11.6.  At the time of admission was on anticoagulation with aspirin, Plavix and Xarelto.  He has a previous history of small bowel AVMs raising concern for recurrent bleeding from small bowel AVMs in the setting of anticoagulation.  SBE 08/24/2023 demonstrated 2 nonbleeding angioectasias in the duodenum as well as 1 nonbleeding angioectasia in the proximal jejunum treated with APC.  Hemoglobin is clinically stable today at 8.2 without overt bleeding.  Primary team is attempting to resume anticoagulation and monitoring patient for evidence of recurrent bleeding which is reasonable.   Recommendations Primary team has resumed Xarelto and will monitor patient for evidence of recurrent bleeding  Recommend confirming with vascular surgery team plan for Plavix and aspirin -assume patient will need to resume both of these medications as well given recent vascular surgery Monitor daily CBC   LOS: 3 days   Ottie Glazier  08/25/2023, 3:07 PM  Maren Beach, MD [facility]

## 2023-08-25 NOTE — Progress Notes (Signed)
 Mobility Specialist Progress Note:    08/25/23 1144  Mobility  Activity Ambulated with assistance in hallway  Level of Assistance Contact guard assist, steadying assist  Assistive Device Front wheel walker  Distance Ambulated (ft) 250 ft  Activity Response Tolerated well  Mobility Referral Yes  Mobility visit 1 Mobility  Mobility Specialist Start Time (ACUTE ONLY) [id number]  Mobility Specialist Stop Time (ACUTE ONLY) 0959  Mobility Specialist Time Calculation (min) (ACUTE ONLY) 17 min   Received pt in bed having no complaints and agreeable to mobility. Pt was asymptomatic throughout session. Ambulated on 3L/min, VSS. Returned to room w/o fault. Left in chair w/ call bell in reach and all needs met. Left on 2L/min.   Thompson Grayer Mobility Specialist  Please contact vis Secure Chat or  Rehab Office [phone]

## 2023-08-25 NOTE — Assessment & Plan Note (Addendum)
 Rate controlled. - Continue metoprolol tartrate 25 mg twice daily, can likely transition back to home metoprolol succinate 50 mg daily at discharge - Resume Xarelto, add ASA if stable at later point

## 2023-08-25 NOTE — Progress Notes (Addendum)
 Daily Progress Note Intern Pager: [phone]  Patient name: Jared [name] Medical record number: [id number] Date of birth: Feb 12, 1956 Age: 68 y.o. Gender: male  Primary Care Provider: [name], MD Consultants: GI, vascular surgery Code Status: Full  Pt Overview and Major Events to Date:  3/13-admitted, 2 units PRBCs administered 3/14-1 unit PRBCs 3/5- Enteroscopy with nonbleeding AVMs and Barrett's esophagus  Assessment and Plan:  68 year old male admitted for symptomatic anemia with EGD findings of known AVMs, Barrett's esophagus, and angiectasias in the duodenum and jejunum. Pertinent PMH/PSH includes A-fib on Xarelto, CAD, HFpEF, stroke, IDA, PVD, COPD. Assessment & Plan Acute on chronic anemia Hemoglobin has remained stable around 8 status post 3 units packed red blood cell transfusion.  Small bowel enteroscopy  as above without acute bleeding though multiple telangiectasias treated with APC.  Can consider capsule endoscopy if hemoglobin continues to decline. - Continue monitoring H&H, can likely decrease to every 12 hours today with resumption of Xarelto today, add ASA if stable at later point (per GI recommendations after enteroscope)  - Continue Protonix 40 mg daily - GI following, appreciate recommendations  Left leg pain Chronic and overall stable. - Pending ABI, consider VVS consult of abnormal - Continue gabapentin 300 mg twice daily and acetaminophen 1000 mg Q6 PAF (paroxysmal atrial fibrillation) (HCC) Rate controlled. - Continue metoprolol tartrate 25 mg twice daily, can likely transition back to home metoprolol succinate 50 mg daily at discharge - Resume Xarelto, add ASA if stable at later point Thrombocytosis Stable in the 400s.  Likely reactive in the setting of iron deficiency anemia. - AM CBC  Chronic and Stable Issues: COPD, chronic leg pain, HLD, CAD, tobacco use, GERD  FEN/GI: Carb modified diet PPx: SCDs--Xeralto tonight, discussed wearing SCDs   Dispo:Pending PT recommendations  pending clinical improvement and hemoglobin stabilization  Subjective:  Denies any issues overnight.  States that sometimes he is having some darker stools but not very frequently.  Denies any hematemesis or other blood loss that he noticed.  Denies any abdominal pain.  Objective: Temp:  [97.8 F (36.6 C)-98.7 F (37.1 C)] 97.8 F (36.6 C) (03/16 0429) Pulse Rate:  [63-85] 63 (03/16 0429) Resp:  [16-23] 17 (03/16 0429) BP: (95-129)/(59-80) 114/66 (03/16 0429) SpO2:  [89 %-99 %] 97 % (03/16 0429) Weight:  [90.7 kg] 90.7 kg (03/15 0726) Physical Exam: General: NAD, awake, alert, responsive to all questions Cardiovascular: RRR Respiratory: Clear to auscultation bilaterally, no wheezes rales or crackles, no increased work of breathing on 2 L nasal cannula Abdomen: Soft, mildly tender to palpation in epigastric region, normoactive bowel sounds Extremities: No lower extremity edema  Laboratory: Most recent CBC Lab Results  Component Value Date   WBC 7.8 08/24/2023   HGB 8.3 (L) 08/24/2023   HCT 28.0 (L) 08/24/2023   MCV 88.1 08/24/2023   PLT 452 (H) 08/24/2023   Most recent BMP    Latest Ref Rng & Units 08/24/2023    6:46 AM  BMP  Glucose 70 - 99 mg/dL 664   BUN 8 - 23 mg/dL 13   Creatinine 4.03 - 1.24 mg/dL 4.74   Sodium 259 - 563 mmol/L 139   Potassium 3.5 - 5.1 mmol/L 4.1   Chloride 98 - 111 mmol/L 99   CO2 22 - 32 mmol/L 34   Calcium 8.9 - 10.3 mg/dL 8.8    Levin Erp, MD 08/25/2023, 6:43 AM  PGY-2, [facility] FPTS Intern pager: [phone], text pages welcome Secure chat group  [facility] Teaching Service

## 2023-08-25 NOTE — Assessment & Plan Note (Addendum)
 Hemoglobin has remained stable around 8 status post 3 units packed red blood cell transfusion.  Small bowel enteroscopy  as above without acute bleeding though multiple telangiectasias treated with APC.  Can consider capsule endoscopy if hemoglobin continues to decline. - Continue monitoring H&H, can likely decrease to every 12 hours today with resumption of Xarelto today, add ASA if stable at later point (per GI recommendations after enteroscope)  - Continue Protonix 40 mg daily - GI following, appreciate recommendations

## 2023-08-25 NOTE — Assessment & Plan Note (Addendum)
 Chronic and overall stable. - Pending ABI, consider VVS consult of abnormal - Continue gabapentin 300 mg twice daily and acetaminophen 1000 mg Q6

## 2023-08-26 DIAGNOSIS — D649 Anemia, unspecified: Secondary | ICD-10-CM | POA: Diagnosis not present

## 2023-08-26 LAB — CBC
HCT: 30.8 % — ABNORMAL LOW (ref 39.0–52.0)
Hemoglobin: 8.8 g/dL — ABNORMAL LOW (ref 13.0–17.0)
MCH: 25.3 pg — ABNORMAL LOW (ref 26.0–34.0)
MCHC: 28.6 g/dL — ABNORMAL LOW (ref 30.0–36.0)
MCV: 88.5 fL (ref 80.0–100.0)
Platelets: 446 10*3/uL — ABNORMAL HIGH (ref 150–400)
RBC: 3.48 MIL/uL — ABNORMAL LOW (ref 4.22–5.81)
RDW: 17.1 % — ABNORMAL HIGH (ref 11.5–15.5)
WBC: 10.3 10*3/uL (ref 4.0–10.5)
nRBC: 0 % (ref 0.0–0.2)

## 2023-08-26 LAB — BASIC METABOLIC PANEL
Anion gap: 4 — ABNORMAL LOW (ref 5–15)
BUN: 13 mg/dL (ref 8–23)
CO2: 35 mmol/L — ABNORMAL HIGH (ref 22–32)
Calcium: 8.6 mg/dL — ABNORMAL LOW (ref 8.9–10.3)
Chloride: 101 mmol/L (ref 98–111)
Creatinine, Ser: 0.75 mg/dL (ref 0.61–1.24)
GFR, Estimated: >60 mL/min (ref >60)
Glucose, Bld: 103 mg/dL — ABNORMAL HIGH (ref 70–99)
Potassium: 4.5 mmol/L (ref 3.5–5.1)
Sodium: 140 mmol/L (ref 135–145)

## 2023-08-26 LAB — MAGNESIUM: Magnesium: 2.1 mg/dL (ref 1.7–2.4)

## 2023-08-26 MED ORDER — ALBUTEROL SULFATE (2.5 MG/3ML) 0.083% IN NEBU: 2.5000 mg | INHALATION_SOLUTION | Freq: Four times a day (QID) | RESPIRATORY_TRACT | Status: DC | PRN

## 2023-08-26 MED ORDER — ALBUTEROL SULFATE (2.5 MG/3ML) 0.083% IN NEBU
2.5000 mg | INHALATION_SOLUTION | Freq: Two times a day (BID) | RESPIRATORY_TRACT | Status: DC
  Administered 2023-08-26: 2.5 mg via RESPIRATORY_TRACT
  Filled 2023-08-26: qty 3

## 2023-08-26 NOTE — Assessment & Plan Note (Signed)
 Continues to be stable in the 400s.  Likely reactive in the setting of iron deficiency anemia. - AM CBC

## 2023-08-26 NOTE — Assessment & Plan Note (Addendum)
 Rate controlled. - Continue metoprolol tartrate 25 mg twice daily, can likely transition back to home metoprolol succinate 50 mg daily at discharge - Resumed Xarelto, will restart ASA

## 2023-08-26 NOTE — Assessment & Plan Note (Addendum)
 Chronic and overall stable. - Pending ABI, consider VVS consult of abnormal - Continue scheduled gabapentin 300 mg twice daily and acetaminophen 1000 mg Q6

## 2023-08-26 NOTE — Progress Notes (Signed)
    Progress Note   Subjective  Chief Complaint: Symptomatic anemia and small bowel angioectasias  -- No acute events overnight -- Hemoglobin currently stable at 8.8 -- No overt bleeding    Objective   Vital signs in last 24 hours: Temp:  [97.7 F (36.5 C)-98.8 F (37.1 C)] 97.7 F (36.5 C) (03/17 0437) Pulse Rate:  [68-70] 69 (03/17 0437) Resp:  [17-18] 17 (03/17 0437) BP: (116-120)/(72-74) 120/74 (03/17 0437) SpO2:  [96 %-100 %] 97 % (03/17 1302) Last BM Date : 08/24/23 General:    Resting quietly in bed in no distress Heart:  Regular rate and rhythm; no murmurs Lungs: Respirations even and unlabored, lungs CTA bilaterally Abdomen:  Soft, nontender and nondistended. Normal bowel sounds. Extremities:  Without edema. Neurologic:  Alert and oriented,  grossly normal neurologically. Psych:  Cooperative. Normal mood and affect.  Intake/Output from previous day: No intake/output data recorded. Intake/Output this shift: No intake/output data recorded.  Lab Results: Recent Labs    08/24/23 0646 08/24/23 1233 08/25/23 0704 08/25/23 0735 08/25/23 1821 08/26/23 0615  WBC 7.8  --  8.3  --   --  10.3  HGB 8.5*   < > 8.3* 8.2* 8.4* 8.8*  HCT 28.8*   < > 28.5* 28.0* 29.1* 30.8*  PLT 452*  --  429*  --   --  446*   < > = values in this interval not displayed.   BMET Recent Labs    08/24/23 0646 08/25/23 0704 08/26/23 0615  NA 139 143 140  K 4.1 4.3 4.5  CL 99 102 101  CO2 34* 31 35*  GLUCOSE 105* 95 103*  BUN 13 12 13   CREATININE 0.80 0.77 0.75  CALCIUM 8.8* 8.5* 8.6*   LFT No results for input(s): "PROT", "ALBUMIN", "AST", "ALT", "ALKPHOS", "BILITOT", "BILIDIR", "IBILI" in the last 72 hours. PT/INR No results for input(s): "LABPROT", "INR" in the last 72 hours.  Studies/Results: No results found.  SBE 08/24/23    Assessment / Plan:    68 year old African-American male admitted with weakness, shortness of breath, symptomatic anemia hemoglobin 6.1 down  from 11.6.  At the time of admission was on anticoagulation with aspirin, Plavix and Xarelto.  He has a previous history of small bowel AVMs raising concern for recurrent bleeding from small bowel AVMs in the setting of anticoagulation.  SBE 08/24/2023 demonstrated 2 nonbleeding angioectasias in the duodenum as well as 1 nonbleeding angioectasia in the proximal jejunum treated with APC.  Hemoglobin is clinically stable today at 8.2 without overt bleeding.    Primary team resumed Xarelto and aspirin on 08/25/2023 and there has been no recurrent bleeding.  Per communication with the primary team they indicate that vascular surgery has advised Plavix indefinitely and will not be resumed.  Recommendations Patient overall stable for discharge today given absence of bleeding after resumption of Xarelto and aspirin. Agree with PCP follow-up and repeat CBC at that time. Reasonable to consider future iron infusions given chronic anemia and blood loss.   LOS: 4 days   Ottie Glazier  08/26/2023, 3:08 PM  Maren Beach, MD [facility]

## 2023-08-26 NOTE — Discharge Summary (Addendum)
 Family Medicine Teaching [facility] Discharge Summary  Patient name: Jared [name] Medical record number: [id number] Date of birth: 03/27/1956 Age: 68 y.o. Gender: male Date of Admission: 08/22/2023  Date of Discharge: 08/26/23 Admitting Physician: Para March, DO  Primary Care Provider: [name], MD Consultants: GI  Indication for Hospitalization: symptomatic anemia  Brief Hospital Course:  Zelig Gacek is a 68 y.o. male with history of A-fib (on Xarelto), COPD, CAD, type 2 diabetes, hypertension, heart failure improved ejection fraction who was admitted for symptomatic anemia requiring blood transfusion and concern for GI bleed.  Symptomatic anemia Initial hemoglobin of 6.1, with a positive fecal occult blood test.  Started on IV PPI.  Patient received total 3U pRBCs transfusion, and hemoglobin increased appropriately.  Transfusion threshold was set at 8.0, given history of coronary artery disease. GI was consulted and recommended endoscopy, which showed 3 non-bleeding angioectasias which were treated with APC; No other areas of concern identified. Hgb remained stable and patient was discharged in good condition.   Other chronic conditions were medically managed with home medications and formulary alternatives as necessary (HTN, T2DM, CHF, COPD, CAD)  Follow-up recommendations Repeat CBC by PCP at hospital follow-up.  Consider scheduling IV iron on an outpatient setting Recommend cardiology follow-up  Discharge Diagnoses/Problem List:  Principal Problem:   Acute on chronic anemia Active Problems:   Symptomatic anemia   Chronic respiratory failure with hypoxia (HCC)   Essential hypertension   Atrial flutter (HCC)   Type 2 diabetes mellitus (HCC)   CAD (coronary artery disease)   COPD GOLD III/still smoking    Anemia due to chronic blood loss   Angiectasia of gastrointestinal tract   Peripheral vascular disease (HCC)   PAF (paroxysmal atrial fibrillation)  (HCC)   History of CVA (cerebrovascular accident)   Heart failure with improved ejection fraction (HFimpEF) (HCC)   Anemia   Thrombocytosis   Left leg pain   Chronic health problem  Disposition: home  Discharge Condition: stable  Discharge Exam:  General: Well-appearing, NAD Cardiovascular: RRR, no murmurs Respiratory: CTA bilaterally, no increased work of breathing on room air Abdomen: Normoactive bowel sounds, soft, nontender Extremities: Moves all equally  Significant Procedures:  08/24/23: Small Bowel Endoscopy -2 nonbleeding angioectasias in the duodenum, 1 nonbleeding angioectasia in the proximal jejunum; both treated with APC.  Significant Labs and Imaging:  Recent Labs  Lab 08/25/23 0704 08/25/23 0735 08/25/23 1821 08/26/23 0615  WBC 8.3  --   --  10.3  HGB 8.3* 8.2* 8.4* 8.8*  HCT 28.5* 28.0* 29.1* 30.8*  PLT 429*  --   --  446*   Recent Labs  Lab 08/25/23 0704 08/26/23 0615  NA 143 140  K 4.3 4.5  CL 102 101  CO2 31 35*  GLUCOSE 95 103*  BUN 12 13  CREATININE 0.77 0.75  CALCIUM 8.5* 8.6*  MG  --  2.1    CXR 08/22/23 No active disease.  CT A/P 08/23/23: 1. Previous appendectomy. Drain has been removed. There is no longer any inflammatory change seen in the region. The appearance is satisfactory. 2. 3 mm nonobstructing stone in the midportion of the left kidney. 3. Aortic atherosclerosis. 4. Chronic lower lumbar degenerative changes.  Results/Tests Pending at Time of Discharge: none  Discharge Medications:  Allergies as of 08/26/2023       Reactions   Lisinopril Cough        Medication List     PAUSE taking these medications    hydrALAZINE 50 MG  tablet Wait to take this until your doctor or other care provider tells you to start again. Commonly known as: APRESOLINE Take 1 tablet (50 mg total) by mouth every 8 (eight) hours.   isosorbide mononitrate 30 MG 24 hr tablet Wait to take this until your doctor or other care provider tells  you to start again. Commonly known as: IMDUR Take 1 tablet (30 mg total) by mouth daily.       STOP taking these medications    clopidogrel 75 MG tablet Commonly known as: Plavix       TAKE these medications    Acetaminophen Extra Strength 500 MG Tabs Take 2 tablets (1,000 mg total) by mouth every 8 (eight) hours as needed for moderate pain.   albuterol 108 (90 Base) MCG/ACT inhaler Commonly known as: Ventolin HFA INHALE 2 PUFFS INTO THE LUNGS EVERY 6 (SIX) HOURS AS NEEDED FOR WHEEZING OR SHORTNESS OF BREATH. What changed: Another medication with the same name was changed. Make sure you understand how and when to take each.   albuterol (2.5 MG/3ML) 0.083% nebulizer solution Commonly known as: PROVENTIL Take 3 mLs (2.5 mg total) by nebulization every 2 (two) hours as needed for wheezing or shortness of breath. What changed: when to take this   aspirin EC 81 MG tablet Take 81 mg by mouth daily. Swallow whole.   atorvastatin 80 MG tablet Commonly known as: LIPITOR Take 1 tablet (80 mg total) by mouth daily.   Breztri Aerosphere 160-9-4.8 MCG/ACT Aero Generic drug: budeson-glycopyrrolate-formoterol Take 2 puffs first thing in morning and then another 2 puffs about 12 hours later. What changed:  how much to take how to take this when to take this additional instructions   DULoxetine 60 MG capsule Commonly known as: Cymbalta Take 1 capsule (60 mg total) by mouth daily. For chronic leg pains   ezetimibe 10 MG tablet Commonly known as: ZETIA Take 1 tablet (10 mg total) by mouth daily.   ferrous sulfate 325 (65 FE) MG tablet Commonly known as: FeroSul Take 1 tablet (325 mg total) by mouth 2 (two) times daily with a meal.   folic acid 1 MG tablet Commonly known as: FOLVITE Take 1 tablet (1 mg total) by mouth daily.   furosemide 40 MG tablet Commonly known as: LASIX Take 1 tablet (40 mg total) by mouth 2 (two) times daily.   gabapentin 300 MG capsule Commonly  known as: NEURONTIN Take 1 capsule (300 mg total) by mouth 2 (two) times daily.   metoprolol succinate 50 MG 24 hr tablet Commonly known as: TOPROL-XL Take 1.5 tablets (75 mg total) by mouth daily. Take with or immediately following a meal. What changed:  how much to take additional instructions   multivitamin with minerals Tabs tablet Take 1 tablet by mouth daily.   nitroGLYCERIN 0.4 MG SL tablet Commonly known as: NITROSTAT Place 0.4 mg under the tongue every 5 (five) minutes as needed for chest pain.   OXYGEN Inhale 2 L/min into the lungs continuous.   pantoprazole 40 MG tablet Commonly known as: Protonix Take 1 tablet (40 mg total) by mouth daily.   Vitamin D (Ergocalciferol) 1.25 MG (50000 UNIT) Caps capsule Commonly known as: Drisdol Take 1 capsule (50,000 Units total) by mouth once a week.   Xarelto 20 MG Tabs tablet Generic drug: rivaroxaban Take 1 tablet (20 mg total) by mouth daily with supper.        Discharge Instructions: Please refer to Patient Instructions section of EMR for  full details.  Patient was counseled important signs and symptoms that should prompt return to medical care, changes in medications, dietary instructions, activity restrictions, and follow up appointments.   Follow-Up Appointments:  Follow-up Information     [facility] Follow up.   Specialty: Home Health Services Why: [facility] will provide home health services.  They will call you in the next 24-48 hours to set up services. Contact information: [address] [place] [address] [phone]                 Cyndia Skeeters, DO 08/26/2023, 3:04 PM PGY-1, [facility]

## 2023-08-26 NOTE — Progress Notes (Signed)
     Daily Progress Note Intern Pager: [phone]  Patient name: Jared [name] Medical record number: [id number] Date of birth: [birth year] Age: 68 y.o. Gender: male  Primary Care Provider: [name], MD Consultants: GI, vascular surgery Code Status: Full  Pt Overview and Major Events to Date:  3/13-admitted, 2 units PRBCs administered 3/14-1 unit PRBCs 3/5- Enteroscopy with nonbleeding AVMs and Barrett's esophagus  Assessment and Plan: Jared [name] is a 68 year old male admitted for symptomatic anemia requiring multiple blood transfusions and GI workup. Hgb stable this AM, no signs of active bleeding.  Assessment & Plan Acute on chronic anemia Hemoglobin this AM stable at 8.8; has had total 3U PRBCs while admitted.  Has resumed Xarelto.  Per VVS consult on admission patient tried to discontinue Plavix, but resume aspirin. - Continue Xarelto, start ASA 81 mg today - Per VVS permanently discontinue Plavix - Continue Protonix 40 mg daily - GI following, appreciate recommendations  -Follow-up magnesium and replete as indicated Left leg pain Chronic and overall stable. - Pending ABI, consider VVS consult of abnormal - Continue scheduled gabapentin 300 mg twice daily and acetaminophen 1000 mg Q6 PAF (paroxysmal atrial fibrillation) (HCC) Rate controlled. - Continue metoprolol tartrate 25 mg twice daily, can likely transition back to home metoprolol succinate 50 mg daily at discharge - Resumed Xarelto, will restart ASA Thrombocytosis Continues to be stable in the 400s.  Likely reactive in the setting of iron deficiency anemia. - AM CBC Chronic health problem COPD - Continue Breztri formulary equivalent (Incruse Ellipta) Chronic Leg pain - Continue duloxetine 60 mg daily, gabapentin 300 mg BID HLD, CAD - Continue ezetimibe 10 mg daily, atorvastatin 80 mg daily Tobacco use - 14 mg nicotine patch PRN GERD - On home pantoprazole 40 mg daily  FEN/GI: Carb modified PPx:  SCDs Dispo:Home tomorrow. Barriers include stable hemoglobin.   Subjective:  Patient seen this morning sitting up in bed.  He is eager to discharge today.  States that he feels very well.  Objective: Temp:  [97.7 F (36.5 C)-98.8 F (37.1 C)] 97.7 F (36.5 C) (03/17 0437) Pulse Rate:  [68-76] 69 (03/17 0437) Resp:  [17-18] 17 (03/17 0437) BP: (116-120)/(66-74) 120/74 (03/17 0437) SpO2:  [99 %-100 %] 100 % (03/17 0437) Physical Exam: General: Well-appearing, NAD Cardiovascular: RRR, no murmurs Respiratory: CTA bilaterally, no increased work of breathing on room air Abdomen: Normoactive bowel sounds, soft, nontender Extremities: Moves all equally  Laboratory: Most recent CBC Lab Results  Component Value Date   WBC 10.3 08/26/2023   HGB 8.8 (L) 08/26/2023   HCT 30.8 (L) 08/26/2023   MCV 88.5 08/26/2023   PLT 446 (H) 08/26/2023   Most recent BMP    Latest Ref Rng & Units 08/26/2023    6:15 AM  BMP  Glucose 70 - 99 mg/dL 425   BUN 8 - 23 mg/dL 13   Creatinine 9.56 - 1.24 mg/dL 3.87   Sodium 564 - 332 mmol/L 140   Potassium 3.5 - 5.1 mmol/L 4.5   Chloride 98 - 111 mmol/L 101   CO2 22 - 32 mmol/L 35   Calcium 8.9 - 10.3 mg/dL 8.6      Cyndia Skeeters, DO 08/26/2023, 7:44 AM  PGY-1, [facility] FPTS Intern pager: [phone], text pages welcome Secure chat group [facility] Teaching Service

## 2023-08-26 NOTE — TOC Initial Note (Signed)
 Transition of Care [facility]) - Initial/Assessment Note    Patient Details  Name: Jared [name] MRN: [id number] Date of Birth: 10/24/1955  Transition of [facility]) CM/SW Contact:    Janae Bridgeman, RN Phone Number: 08/26/2023, 2:02 PM  Clinical Narrative:                 CM met with the patient at the bedside to discuss TOC needs to return home today/tomorrow depending on medical clearance by MD team - waiting on GI consult per MD.  Patient was offered Medicare choice regarding home health and he states that he would prefer [facility].  HH orders placed and [facility] accepted.  DME at the home includes home oxygen thru [employer], RW, shower seat.  Patient's sister can provide portable oxygen tank and transportation to home via car when stable.  Expected Discharge Plan: Home w Home Health Services Barriers to Discharge: Continued Medical Work up   Patient Goals and CMS Choice Patient states their goals for this hospitalization and ongoing recovery are:: To return home CMS Medicare.gov Compare Post Acute Care list provided to:: Patient Choice offered to / list presented to : Patient [place] ownership interest in [facility].provided to:: Patient    Expected Discharge Plan and Services   Discharge Planning Services: CM Consult Post Acute Care Choice: Home Health Living arrangements for the past 2 months:  (lives in boarding house)                           HH Arranged: PT, OT HH Agency: Other - See comment [facility] home [facility]) Date HH Agency Contacted: 08/26/23 Time HH Agency Contacted: 1401 Representative spoke with at [facility] Agency: [employer] [facility] [employer] home health - [place], CM accepted for PT/OT HH  Prior Living Arrangements/Services Living arrangements for the past 2 months:  (lives in boarding house) Lives with:: Self Patient language and need for interpreter reviewed:: Yes Do you feel safe going back to the place where you live?: Yes      Need for Family  Participation in Patient Care: Yes (Comment) Care giver support system in place?: Yes (comment) Current home services: DME (DME at the home includes RW, shower seat, home oxygen through [employer]) Criminal Activity/Legal Involvement Pertinent to Current Situation/Hospitalization: No - Comment as needed  Activities of Daily Living   ADL Screening (condition at time of admission) Independently performs ADLs?: Yes (appropriate for developmental age) Is the patient deaf or have difficulty hearing?: No Does the patient have difficulty seeing, even when wearing glasses/contacts?: No Does the patient have difficulty concentrating, remembering, or making decisions?: No  Permission Sought/Granted Permission sought to share information with : Case Manager Permission granted to share information with : Yes, Verbal Permission Granted     Permission granted to share info w AGENCY: [facility]        Emotional Assessment Appearance:: Appears stated age Attitude/Demeanor/Rapport: Gracious Affect (typically observed): Accepting Orientation: : Oriented to Self, Oriented to Place, Oriented to  Time, Oriented to Situation Alcohol / Substance Use: Not Applicable Psych Involvement: No (comment)  Admission diagnosis:  Anemia [D64.9] Low hemoglobin [D64.9] Patient Active Problem List   Diagnosis Date Noted   Chronic health problem 08/23/2023   Anemia 08/22/2023   Acute on chronic anemia 08/22/2023   Thrombocytosis 08/22/2023   Left leg pain 08/22/2023   Heme positive stool 05/19/2023   NSVT (nonsustained ventricular tachycardia) (HCC) 05/19/2023   Melena 05/18/2023   Chronic  anticoagulation 05/18/2023   Heart failure with improved ejection fraction (HFimpEF) (HCC) 05/12/2023   HFrEF (heart failure with reduced ejection fraction) (HCC) 05/06/2023   Malnutrition of moderate degree 05/01/2023   Sepsis (HCC) 04/30/2023   History of CVA (cerebrovascular accident) 04/30/2023   PAF (paroxysmal  atrial fibrillation) (HCC) 12/27/2022   Atherosclerosis of aorta (HCC) 10/25/2022   Peripheral vascular disease (HCC) 10/25/2022   Weakness 03/21/2022   Syncope and collapse 02/07/2021   History of GI bleed    Gastric ulcer due to Helicobacter pylori and nonsteroidal anti-inflammatory drug (NSAID)    Epigastric pain    Chronic right maxillary sinusitis 01/29/2019   Upper GI bleed 10/27/2018   Anticoagulated 09/27/2018   Angiectasia of gastrointestinal tract    Anemia due to chronic blood loss    Symptomatic anemia 09/04/2018   Tobacco use 09/04/2018   Noncompliance 06/14/2017   Elevated LFTs 08/13/2016   Chronic respiratory failure with hypoxia (HCC) 07/24/2016   Atrial flutter (HCC)    Hepatic congestion 07/02/2016   Type 2 diabetes mellitus (HCC) 05/14/2016   COPD GOLD III/still smoking  02/29/2016   Cigarette smoker 01/09/2016   CAD (coronary artery disease) 10/07/2013   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, [address] [place] [address] Phone: [phone] Fax: [phone]  [facility] - [place], [place] - [address] [place] [address] Phone: [phone] Fax: [phone]  [facility] [address] [place] [address] Phone: [phone] Fax: [phone]  [id number] [place], [place] - [address] AT [facility] [address] [place] [address] Phone: [phone] Fax: [phone]     Social Drivers of Health (SDOH) Social History: SDOH Screenings   Food Insecurity: No Food Insecurity (08/22/2023)  Recent Concern: Food Insecurity - Food Insecurity Present (06/10/2023)  Housing: Low Risk  (08/22/2023)  Transportation Needs:  No Transportation Needs (08/22/2023)  Utilities: Not At Risk (08/22/2023)  Alcohol Screen: Low Risk  (06/10/2023)  Depression (PHQ2-9): Low Risk  (07/22/2023)  Recent Concern: Depression (PHQ2-9) - Medium Risk (06/10/2023)  Financial Resource Strain: Low Risk  (06/10/2023)  Physical Activity: Insufficiently Active (06/10/2023)  Social Connections: Moderately Isolated (08/22/2023)  Stress: No Stress Concern Present (06/10/2023)  Tobacco Use: High Risk (08/24/2023)  Health Literacy: Adequate Health Literacy (06/10/2023)   SDOH Interventions:     Readmission Risk Interventions    12/27/2022    2:20 PM 12/14/2021    1:38 PM 02/01/2021    3:22 PM  Readmission Risk Prevention Plan  Post Dischage Appt Complete Complete   Medication Screening Complete Complete   Transportation Screening Complete Complete Complete  PCP or Specialist Appt within 5-7 Days   Complete  Home Care Screening   Complete  Medication Review (RN CM)   Complete

## 2023-08-26 NOTE — Progress Notes (Signed)
 Physical Therapy Treatment Patient Details Name: Jared [name] MRN: [id number] DOB: [birth year] Today's Date: 08/26/2023   History of Present Illness Pt is 68 year old presented to [facility]  08/22/23 for acute blood loss anemia. PMH - perforated appendicitis, syncope, AVM, CABG, CAD, CAP, CHF, COPD on home O2, CVA, HTN, A flutter, DM2    PT Comments  Pt required supervision transfers, and CGA amb 200' with RW. Mobilized on 3L with desat to 84%. 2/4 DOE. Quick return to 90s after sitting in recliner. SpO2 96% at rest on 2L. Pt educated on pursed lip breathing. Pt in recliner at end of session. Recommend HHPT upon d/c to address mobility education, activity tolerance and energy conservation strategies.     If plan is discharge home, recommend the following: Assist for transportation   Can travel by private vehicle        Equipment Recommendations  None recommended by PT    Recommendations for Other Services       Precautions / Restrictions Precautions Precautions: Fall;Other (comment) Precaution/Restrictions Comments: watch sats     Mobility  Bed Mobility Overal bed mobility: Modified Independent                  Transfers Overall transfer level: Needs assistance Equipment used: Ambulation equipment used Transfers: Sit to/from Stand, Bed to chair/wheelchair/BSC Sit to Stand: Supervision   Step pivot transfers: Supervision            Ambulation/Gait Ambulation/Gait assistance: Contact guard assist Gait Distance (Feet): 200 Feet Assistive device: Rolling walker (2 wheels) Gait Pattern/deviations: Step-through pattern, Decreased stride length Gait velocity: WFL Gait velocity interpretation: 1.31 - 2.62 ft/sec, indicative of limited community ambulator   General Gait Details: Mobilized on 3L with desat to 84%. 2/4 DOE. Quick return to 90s upon sitting in recliner. Pt on 2L at rest.   Stairs             Wheelchair Mobility     Tilt Bed    Modified  Rankin (Stroke Patients Only)       Balance Overall balance assessment: Mild deficits observed, not formally tested                                          Communication Communication Communication: No apparent difficulties  Cognition Arousal: Alert Behavior During Therapy: WFL for tasks assessed/performed   PT - Cognitive impairments: No apparent impairments                         Following commands: Intact      Cueing    Exercises      General Comments General comments (skin integrity, edema, etc.): SpO2 96% at rest on 2L      Pertinent Vitals/Pain Pain Assessment Pain Assessment: No/denies pain    Home Living                          Prior Function            PT Goals (current goals can now be found in the care plan section) Acute Rehab PT Goals Patient Stated Goal: home Progress towards PT goals: Progressing toward goals    Frequency    Min 2X/week      PT Plan      Co-evaluation  AM-PAC PT "6 Clicks" Mobility   Outcome Measure  Help needed turning from your back to your side while in a flat bed without using bedrails?: None Help needed moving from lying on your back to sitting on the side of a flat bed without using bedrails?: None Help needed moving to and from a bed to a chair (including a wheelchair)?: A Little Help needed standing up from a chair using your arms (e.g., wheelchair or bedside chair)?: A Little Help needed to walk in hospital room?: A Little Help needed climbing 3-5 steps with a railing? : A Little 6 Click Score: 20    End of Session Equipment Utilized During Treatment: Gait belt;Oxygen Activity Tolerance: Patient tolerated treatment well Patient left: in chair;with call bell/phone within reach Nurse Communication: Mobility status PT Visit Diagnosis: Muscle weakness (generalized) (M62.81);Other abnormalities of gait and mobility (R26.89)     Time: 1914-7829 PT  Time Calculation (min) (ACUTE ONLY): 20 min  Charges:    $Gait Training: 8-22 mins PT General Charges $$ ACUTE PT VISIT: 1 Visit                     Ferd Glassing., PT  Office # [phone]    Ilda Foil 08/26/2023, 11:59 AM

## 2023-08-26 NOTE — Assessment & Plan Note (Addendum)
 Hemoglobin this AM stable at 8.8; has had total 3U PRBCs while admitted.  Has resumed Xarelto.  Per VVS consult on admission patient tried to discontinue Plavix, but resume aspirin. - Continue Xarelto, start ASA 81 mg today - Per VVS permanently discontinue Plavix - Continue Protonix 40 mg daily - GI following, appreciate recommendations  -Follow-up magnesium and replete as indicated

## 2023-08-26 NOTE — Discharge Instructions (Signed)
 Dear Jared [name],   Thank you for letting us participate in your care! We are so glad you are feeling better. You were admitted to the hospital because your blood counts were low. You got blood transfusions and now you are doing much better. It is very important that your follow up with your doctors and take your medications as prescribed.  POST-HOSPITAL & CARE INSTRUCTIONS We recommend following up with your PCP within 1 week from being discharged from the hospital. Please let PCP/Specialists know of any changes in medications that were made which you will be able to see in the medications section of this packet.  DOCTOR'S APPOINTMENTS & FOLLOW UP Future Appointments  Date Time Provider Department Center  09/09/2023  8:30 AM [name], MD [facility] None  09/12/2023  2:45 PM [facility] None  11/12/2023  2:45 PM [facility] None  01/13/2024 12:30 PM [facility] None  01/13/2024  1:00 PM Carlean Jews, NP [facility] None  02/12/2024  9:20 AM [facility] ANNUAL WELLNESS VISIT [facility] None     Thank you for choosing [facility]! Take care and be well!  Family Medicine Teaching Service Inpatient Team [place]  [facility]  [address] [place], [place] [address] [phone]

## 2023-08-26 NOTE — Care Management Important Message (Signed)
 Important Message  Patient Details  Name: Jared [name] MRN: [id number] Date of Birth: 1956-06-07   Important Message Given:  Yes - Medicare IM     Dorena Bodo 08/26/2023, 2:13 PM

## 2023-08-26 NOTE — Assessment & Plan Note (Signed)
 COPD - Continue Breztri formulary equivalent (Incruse Ellipta) Chronic Leg pain - Continue duloxetine 60 mg daily, gabapentin 300 mg BID HLD, CAD - Continue ezetimibe 10 mg daily, atorvastatin 80 mg daily Tobacco use - 14 mg nicotine patch PRN GERD - On home pantoprazole 40 mg daily

## 2023-08-27 ENCOUNTER — Other Ambulatory Visit: Payer: Self-pay

## 2023-08-27 ENCOUNTER — Encounter: Payer: Self-pay | Admitting: Nurse Practitioner

## 2023-08-27 ENCOUNTER — Telehealth: Payer: Self-pay

## 2023-08-27 ENCOUNTER — Other Ambulatory Visit: Payer: Self-pay | Admitting: Family Medicine

## 2023-08-27 DIAGNOSIS — I110 Hypertensive heart disease with heart failure: Secondary | ICD-10-CM

## 2023-08-27 NOTE — Transitions of Care (Post Inpatient/ED Visit) (Signed)
 08/27/2023  Name: Jared [name] MRN: [id number] DOB: 01-Dec-1955  Today's TOC FU Call Status: Today's TOC FU Call Status:: Successful TOC FU Call Completed TOC FU Call Complete Date: 08/27/23 Patient's Name and Date of Birth confirmed.  Transition Care Management Follow-up Telephone Call Date of Discharge: 08/26/23 Discharge Facility: [facility]) Type of Discharge: Inpatient Admission Primary Inpatient Discharge Diagnosis:: Acute on chronic anemia, admitting hemoglobin 6.1 How have you been since you were released from the hospital?: Better Any questions or concerns?: No  Items Reviewed: Did you receive and understand the discharge instructions provided?: Yes Medications obtained,verified, and reconciled?: Yes (Medications Reviewed) Any new allergies since your discharge?: No Dietary orders reviewed?: Yes Type of Diet Ordered:: Low sodium, heart healthy Do you have support at home?: Yes People in Home: sibling(s) Name of Support/Comfort Primary Source: States he has support if needed, sister can help as needed, patient resides in boarding home  Medications Reviewed Today: Medications Reviewed Today     Reviewed by Marcos Eke, RN (Registered Nurse) on 08/27/23 at 1144  Med List Status: <None>   Medication Order Taking? Sig Documenting Provider Last Dose Status Informant  acetaminophen (TYLENOL) 500 MG tablet [id number] Yes Take 2 tablets (1,000 mg total) by mouth every 8 (eight) hours as needed for moderate pain. [name], MD Taking Active Self, Multiple Informants, Pharmacy Records  albuterol (PROVENTIL) (2.5 MG/3ML) 0.083% nebulizer solution [id number] Yes Take 3 mLs (2.5 mg total) by nebulization every 2 (two) hours as needed for wheezing or shortness of breath.  Patient taking differently: Take 2.5 mg by nebulization every 4 (four) hours as needed for wheezing or shortness of breath.   Ghimire, Werner Lean, MD Taking Active Self, Multiple Informants, Pharmacy  Records  albuterol (VENTOLIN HFA) 108 (90 Base) MCG/ACT inhaler [id number] Yes INHALE 2 PUFFS INTO THE LUNGS EVERY 6 (SIX) HOURS AS NEEDED FOR WHEEZING OR SHORTNESS OF BREATH. Maretta Bees, MD Taking Active Self, Multiple Informants, Pharmacy Records  aspirin EC 81 MG tablet [id number] Yes Take 81 mg by mouth daily. Swallow whole. [provider] Taking Active Self, Multiple Informants, Pharmacy Records  atorvastatin (LIPITOR) 80 MG tablet [id number] Yes Take 1 tablet (80 mg total) by mouth daily. [name], MD Taking Active Self, Multiple Informants, Pharmacy Records  Budeson-Glycopyrrol-Formoterol (BREZTRI AEROSPHERE) 160-9-4.8 MCG/ACT AERO [id number] Yes Take 2 puffs first thing in morning and then another 2 puffs about 12 hours later.  Patient taking differently: Inhale 2 puffs into the lungs in the morning and at bedtime.   Nyoka Cowden, MD Taking Active Multiple Informants, Pharmacy Records, Self  DULoxetine (CYMBALTA) 60 MG capsule [id number] Yes Take 1 capsule (60 mg total) by mouth daily. For chronic leg pains Marcine Matar, MD Taking Active Self, Multiple Informants, Pharmacy Records  ergocalciferol (DRISDOL) 1.25 MG (50000 UT) capsule [id number] Yes Take 1 capsule (50,000 Units total) by mouth once a week. [name], MD Taking Active Self, Multiple Informants, Pharmacy Records  ezetimibe (ZETIA) 10 MG tablet [id number] Yes Take 1 tablet (10 mg total) by mouth daily. Pricilla Riffle, MD Taking Active Self, Multiple Informants, Pharmacy Records  ferrous sulfate (FEROSUL) 325 (65 FE) MG tablet [id number] Yes Take 1 tablet (325 mg total) by mouth 2 (two) times daily with a meal. Ghimire, Werner Lean, MD Taking Active Self, Multiple Informants, Pharmacy Records  folic acid (FOLVITE) 1 MG tablet [id number] Yes Take 1 tablet (1 mg total) by mouth daily. [name], MD Taking Active Self, Multiple Informants,  Pharmacy Records  furosemide (LASIX) 40 MG tablet [id number] Yes  Take 1 tablet (40 mg total) by mouth 2 (two) times daily. [name], MD Taking Active Multiple Informants, Pharmacy Records, Self  gabapentin (NEURONTIN) 300 MG capsule [id number] Yes Take 1 capsule (300 mg total) by mouth 2 (two) times daily. [name], MD Taking Active Self, Multiple Informants, Pharmacy Records  hydrALAZINE (APRESOLINE) 50 MG tablet [id number]  Take 1 tablet (50 mg total) by mouth every 8 (eight) hours.  Patient taking differently: Take 50 mg by mouth daily.   [name], MD  Active Self, Multiple Informants, Pharmacy Records  isosorbide mononitrate (IMDUR) 30 MG 24 hr tablet [id number]  Take 1 tablet (30 mg total) by mouth daily. [name], MD  Active Self, Multiple Informants, Pharmacy Records  metoprolol succinate (TOPROL-XL) 50 MG 24 hr tablet [id number] Yes Take 1.5 tablets (75 mg total) by mouth daily. Take with or immediately following a meal.  Patient taking differently: Take 50 mg by mouth daily.   Ghimire, Werner Lean, MD Taking Active Self, Multiple Informants, Pharmacy Records           Med Note Cheron Schaumann, [place]   Thu Aug 22, 2023  2:22 PM) 02/28/2023 50 MG TB24 (disp 90, 90d supply - last fill date does not support patients claim  Multiple Vitamin (MULTIVITAMIN WITH MINERALS) TABS tablet [id number] Yes Take 1 tablet by mouth daily. Maretta Bees, MD Taking Active Self, Multiple Informants, Pharmacy Records  nitroGLYCERIN (NITROSTAT) 0.4 MG SL tablet [id number] Yes Place 0.4 mg under the tongue every 5 (five) minutes as needed for chest pain. [provider] Taking Active Multiple Informants, Pharmacy Records, Self  OXYGEN [id number] Yes Inhale 2 L/min into the lungs continuous. [provider] Taking Active Self, Multiple Informants, Pharmacy Records  pantoprazole (PROTONIX) 40 MG tablet [id number] Yes Take 1 tablet (40 mg total) by mouth daily. [name], MD Taking Active Self, Multiple Informants, Pharmacy Records   rivaroxaban (XARELTO) 20 MG TABS tablet [id number] Yes Take 1 tablet (20 mg total) by mouth daily with supper. Marcine Matar, MD Taking Active Self, Multiple Informants, Pharmacy Records    Discontinued 04/04/20 1029             Home Care and Equipment/Supplies: Were Home Health Services Ordered?: Yes Name of Home Health Agency:: [facility] Has Agency set up a time to come to your home?: No (Patient missed the home health agency's initial call today but he did call the agency back and left a message for the agency to call him back today) Any new equipment or medical supplies ordered?: No  Functional Questionnaire: Do you need assistance with bathing/showering or dressing?: Yes (utilies shower seat) Do you need assistance with meal preparation?: No Do you need assistance with eating?: No Do you have difficulty maintaining continence: No Do you need assistance with getting out of bed/getting out of a chair/moving?: Yes (utilizes rolling walker) Do you have difficulty managing or taking your medications?: No  Follow up appointments reviewed: PCP Follow-up appointment confirmed?: Yes Date of PCP follow-up appointment?: 09/09/23 Follow-up Provider: Dr. Odette [facility] Follow-up appointment confirmed?: NA Do you need transportation to your follow-up appointment?: No Do you understand care options if your condition(s) worsen?: Yes-patient verbalized understanding    Jaymeson Mengel A. Mliss Fritz RN, BA, [facility], CRRN [facility] RN Care Manager, Transition of Care [phone]

## 2023-08-28 ENCOUNTER — Other Ambulatory Visit: Payer: Self-pay

## 2023-08-29 ENCOUNTER — Encounter: Payer: Self-pay | Admitting: Pharmacist

## 2023-08-29 ENCOUNTER — Other Ambulatory Visit: Payer: Self-pay

## 2023-08-29 DIAGNOSIS — D500 Iron deficiency anemia secondary to blood loss (chronic): Secondary | ICD-10-CM | POA: Diagnosis not present

## 2023-08-29 DIAGNOSIS — J449 Chronic obstructive pulmonary disease, unspecified: Secondary | ICD-10-CM | POA: Diagnosis not present

## 2023-08-29 DIAGNOSIS — I5020 Unspecified systolic (congestive) heart failure: Secondary | ICD-10-CM | POA: Diagnosis not present

## 2023-08-29 DIAGNOSIS — I5032 Chronic diastolic (congestive) heart failure: Secondary | ICD-10-CM | POA: Diagnosis not present

## 2023-08-29 DIAGNOSIS — I110 Hypertensive heart disease with heart failure: Secondary | ICD-10-CM | POA: Diagnosis not present

## 2023-08-29 DIAGNOSIS — I480 Paroxysmal atrial fibrillation: Secondary | ICD-10-CM | POA: Diagnosis not present

## 2023-08-29 DIAGNOSIS — I2510 Atherosclerotic heart disease of native coronary artery without angina pectoris: Secondary | ICD-10-CM | POA: Diagnosis not present

## 2023-08-29 DIAGNOSIS — I4892 Unspecified atrial flutter: Secondary | ICD-10-CM | POA: Diagnosis not present

## 2023-08-29 DIAGNOSIS — E1151 Type 2 diabetes mellitus with diabetic peripheral angiopathy without gangrene: Secondary | ICD-10-CM | POA: Diagnosis not present

## 2023-09-03 DIAGNOSIS — I4892 Unspecified atrial flutter: Secondary | ICD-10-CM | POA: Diagnosis not present

## 2023-09-03 DIAGNOSIS — I5020 Unspecified systolic (congestive) heart failure: Secondary | ICD-10-CM | POA: Diagnosis not present

## 2023-09-03 DIAGNOSIS — I110 Hypertensive heart disease with heart failure: Secondary | ICD-10-CM | POA: Diagnosis not present

## 2023-09-03 DIAGNOSIS — J449 Chronic obstructive pulmonary disease, unspecified: Secondary | ICD-10-CM | POA: Diagnosis not present

## 2023-09-03 DIAGNOSIS — D500 Iron deficiency anemia secondary to blood loss (chronic): Secondary | ICD-10-CM | POA: Diagnosis not present

## 2023-09-03 DIAGNOSIS — I480 Paroxysmal atrial fibrillation: Secondary | ICD-10-CM | POA: Diagnosis not present

## 2023-09-03 DIAGNOSIS — E1151 Type 2 diabetes mellitus with diabetic peripheral angiopathy without gangrene: Secondary | ICD-10-CM | POA: Diagnosis not present

## 2023-09-03 DIAGNOSIS — I2510 Atherosclerotic heart disease of native coronary artery without angina pectoris: Secondary | ICD-10-CM | POA: Diagnosis not present

## 2023-09-03 DIAGNOSIS — I5032 Chronic diastolic (congestive) heart failure: Secondary | ICD-10-CM | POA: Diagnosis not present

## 2023-09-04 DIAGNOSIS — I5032 Chronic diastolic (congestive) heart failure: Secondary | ICD-10-CM | POA: Diagnosis not present

## 2023-09-04 DIAGNOSIS — E1151 Type 2 diabetes mellitus with diabetic peripheral angiopathy without gangrene: Secondary | ICD-10-CM | POA: Diagnosis not present

## 2023-09-04 DIAGNOSIS — I2510 Atherosclerotic heart disease of native coronary artery without angina pectoris: Secondary | ICD-10-CM | POA: Diagnosis not present

## 2023-09-04 DIAGNOSIS — I4892 Unspecified atrial flutter: Secondary | ICD-10-CM | POA: Diagnosis not present

## 2023-09-04 DIAGNOSIS — D500 Iron deficiency anemia secondary to blood loss (chronic): Secondary | ICD-10-CM | POA: Diagnosis not present

## 2023-09-04 DIAGNOSIS — J449 Chronic obstructive pulmonary disease, unspecified: Secondary | ICD-10-CM | POA: Diagnosis not present

## 2023-09-04 DIAGNOSIS — I110 Hypertensive heart disease with heart failure: Secondary | ICD-10-CM | POA: Diagnosis not present

## 2023-09-04 DIAGNOSIS — I480 Paroxysmal atrial fibrillation: Secondary | ICD-10-CM | POA: Diagnosis not present

## 2023-09-04 DIAGNOSIS — I5020 Unspecified systolic (congestive) heart failure: Secondary | ICD-10-CM | POA: Diagnosis not present

## 2023-09-09 ENCOUNTER — Encounter: Payer: Self-pay | Admitting: Nurse Practitioner

## 2023-09-09 ENCOUNTER — Other Ambulatory Visit: Payer: Self-pay

## 2023-09-09 ENCOUNTER — Encounter: Payer: Self-pay | Admitting: Family Medicine

## 2023-09-09 ENCOUNTER — Ambulatory Visit: Payer: Medicare HMO | Attending: Family Medicine | Admitting: Family Medicine

## 2023-09-09 VITALS — BP 123/63 | HR 95 | Ht 75.0 in | Wt 196.2 lb

## 2023-09-09 DIAGNOSIS — I4891 Unspecified atrial fibrillation: Secondary | ICD-10-CM | POA: Diagnosis not present

## 2023-09-09 DIAGNOSIS — J449 Chronic obstructive pulmonary disease, unspecified: Secondary | ICD-10-CM

## 2023-09-09 DIAGNOSIS — I4892 Unspecified atrial flutter: Secondary | ICD-10-CM

## 2023-09-09 DIAGNOSIS — I110 Hypertensive heart disease with heart failure: Secondary | ICD-10-CM

## 2023-09-09 DIAGNOSIS — I119 Hypertensive heart disease without heart failure: Secondary | ICD-10-CM | POA: Diagnosis not present

## 2023-09-09 DIAGNOSIS — D500 Iron deficiency anemia secondary to blood loss (chronic): Secondary | ICD-10-CM | POA: Diagnosis not present

## 2023-09-09 DIAGNOSIS — K250 Acute gastric ulcer with hemorrhage: Secondary | ICD-10-CM

## 2023-09-09 DIAGNOSIS — E559 Vitamin D deficiency, unspecified: Secondary | ICD-10-CM

## 2023-09-09 DIAGNOSIS — M79671 Pain in right foot: Secondary | ICD-10-CM

## 2023-09-09 DIAGNOSIS — K922 Gastrointestinal hemorrhage, unspecified: Secondary | ICD-10-CM

## 2023-09-09 MED ORDER — GABAPENTIN 300 MG PO CAPS
300.0000 mg | ORAL_CAPSULE | Freq: Two times a day (BID) | ORAL | 1 refills | Status: DC
  Filled 2023-09-09 – 2023-09-19 (×3): qty 90, 45d supply, fill #0
  Filled 2023-11-13 (×2): qty 90, 45d supply, fill #1

## 2023-09-09 MED ORDER — RIVAROXABAN 20 MG PO TABS
20.0000 mg | ORAL_TABLET | Freq: Every day | ORAL | 1 refills | Status: DC
  Filled 2023-09-09: qty 90, 90d supply, fill #0
  Filled 2023-12-30: qty 90, 90d supply, fill #1

## 2023-09-09 MED ORDER — DULOXETINE HCL 60 MG PO CPEP
60.0000 mg | ORAL_CAPSULE | Freq: Every day | ORAL | 1 refills | Status: DC
  Filled 2023-09-09: qty 90, 90d supply, fill #0
  Filled 2023-12-30: qty 90, 90d supply, fill #1

## 2023-09-09 MED ORDER — FUROSEMIDE 40 MG PO TABS
40.0000 mg | ORAL_TABLET | Freq: Two times a day (BID) | ORAL | 1 refills | Status: DC
Start: 2023-09-09 — End: 2024-02-24
  Filled 2023-09-09 – 2023-10-14 (×2): qty 180, 90d supply, fill #0
  Filled 2024-02-13: qty 180, 90d supply, fill #1

## 2023-09-09 MED ORDER — ALBUTEROL SULFATE HFA 108 (90 BASE) MCG/ACT IN AERS
2.0000 | INHALATION_SPRAY | Freq: Four times a day (QID) | RESPIRATORY_TRACT | 6 refills | Status: AC | PRN
  Filled 2023-09-09: qty 18, 25d supply, fill #0
  Filled 2023-12-30: qty 18, 25d supply, fill #1
  Filled 2024-06-15: qty 6.7, 25d supply, fill #2

## 2023-09-09 MED ORDER — ATORVASTATIN CALCIUM 80 MG PO TABS
80.0000 mg | ORAL_TABLET | Freq: Every day | ORAL | 1 refills | Status: DC
  Filled 2023-09-09 – 2023-12-30 (×2): qty 90, 90d supply, fill #0
  Filled 2024-03-30: qty 90, 90d supply, fill #1

## 2023-09-09 MED ORDER — FOLIC ACID 1 MG PO TABS
1.0000 mg | ORAL_TABLET | Freq: Every day | ORAL | 1 refills | Status: DC
  Filled 2023-09-09 – 2023-12-30 (×2): qty 90, 90d supply, fill #0
  Filled 2024-03-30: qty 90, 90d supply, fill #1

## 2023-09-09 MED ORDER — PANTOPRAZOLE SODIUM 40 MG PO TBEC
40.0000 mg | DELAYED_RELEASE_TABLET | Freq: Every day | ORAL | 1 refills | Status: DC
  Filled 2023-09-09 – 2023-09-19 (×2): qty 90, 90d supply, fill #0
  Filled 2023-12-30: qty 90, 90d supply, fill #1

## 2023-09-09 MED ORDER — METOPROLOL SUCCINATE ER 50 MG PO TB24
50.0000 mg | ORAL_TABLET | Freq: Every day | ORAL | 1 refills | Status: DC
  Filled 2023-09-09: qty 90, 90d supply, fill #0
  Filled 2023-12-30: qty 90, 90d supply, fill #1

## 2023-09-09 MED ORDER — FERROUS SULFATE 325 (65 FE) MG PO TABS
325.0000 mg | ORAL_TABLET | Freq: Two times a day (BID) | ORAL | 1 refills | Status: DC
  Filled 2023-09-09: qty 180, 90d supply, fill #0
  Filled 2023-12-30: qty 180, 90d supply, fill #1

## 2023-09-09 NOTE — Patient Instructions (Signed)
 VISIT SUMMARY:  You were seen today for a follow-up on your chronic anemia, gastrointestinal health, heart condition, blood pressure, and COPD. You have been feeling generally well, although you have a lack of appetite in the morning. Your blood pressure is well-controlled, and you have not experienced any bleeding or dizziness. We discussed your recent treatments and the importance of ongoing monitoring and follow-up with your specialists.  YOUR PLAN:  -ANEMIA: Anemia is a condition where you have a lower than normal number of red blood cells. Your chronic anemia has been monitored closely, especially after your recent hospitalization. We will continue to monitor your hemoglobin levels with blood tests in May and July. Coordination with the cancer center for iron infusions and blood tests is ongoing.  -GASTROINTESTINAL BLEED: A gastrointestinal bleed involves bleeding in your digestive tract. Although there is no current evidence of active bleeding, ongoing monitoring is necessary due to your unexplained anemia. Please ensure you follow up with your gastroenterologist for ongoing management.  -NONISCHEMIC CARDIOMYOPATHY (NICM): NICM is a condition where the heart muscle is weakened and cannot pump blood efficiently. You are currently on metoprolol, and other medications were held post-hospitalization. It is important to follow up with your cardiologist to review and manage your medications and to monitor for any symptoms of heart failure or arrhythmias.  -HYPERTENSION: Hypertension is high blood pressure. Your blood pressure is currently well-controlled. We will continue to hold hydralazine and isosorbide due to the risk of low blood pressure. Please monitor your blood pressure regularly and follow up with your cardiologist for medication management.  -CHRONIC OBSTRUCTIVE PULMONARY DISEASE (COPD): COPD is a chronic lung disease that makes it hard to breathe. You are on continuous oxygen therapy at 3  L/min and have not had any acute exacerbations. Continue your oxygen therapy and monitor your respiratory status, adjusting oxygen as needed.  -GENERAL HEALTH MAINTENANCE: You have a history of smoking and are due for lung cancer screening. Your last screening was in August 2024. Please schedule your next lung cancer screening for August 2025.  INSTRUCTIONS:  Please ensure you follow up with the cancer center on April 3rd for a blood test. Schedule follow-up appointments with your cardiologist and gastroenterologist to manage your conditions and medications.

## 2023-09-09 NOTE — Progress Notes (Signed)
 Subjective:  Patient ID: Jared [name], male    DOB: 1956/04/27  Age: 68 y.o. MRN: [id number]  CC: Medical Management of Chronic Issues (Discuss hemoglobin test)     Discussed the use of AI scribe software for clinical note transcription with the patient, who gave verbal consent to proceed.  History of Present Illness The patient, with a history of  hypertension, COPD, tobacco abuse, NICM , CHF (EF 55-60% from echo 04/2023), atrial flutter, chronic respiratory failure with hypoxia (currently on 2-3 L of oxygen), multiple hospitalizations for GI bleed with secondary anemia due to AV malformations, peptic ulcer disease, previous history of CVA, Type 2 DM, PAD status post left femoral-popliteal angioplasty and stenting (in 11/2022), status post laparoscopy appendectomy in 04/2023 and recent hospitalization for anemia presents for TOC visit.   He had presented with a hemoglobin of 6.1 and underwent EGD which revealed nonbleeding angioectasias which was treated with APC. During hospitalization his isosorbide and hydralazine were held.  He reports feeling generally well and has been eating regularly throughout the day but has reduced appetite in the morning. He had been receiving iron infusions for about three weeks, which were then stopped sometime before he presented for hospitalization.  The patient denies any current bleeding from any source, including the nose, mouth, or rectum. He also denies any blood in his urine. He reports no lightheadedness or dizziness. The patient's blood pressure has been well-controlled,.  He remains on Xarelto for management of his atrial flutter/A-fib but has not been to see cardiology in a while.  He is doing well on 3 L of oxygen and continues to use his inhalers but unfortunately also continues to smoke.  Past Medical History:  Diagnosis Date   Acute blood loss anemia 05/18/2023   Acute perforated appendicitis 04/30/2023   AKI (acute kidney injury) (HCC) 11/18/2020    Atrial fibrillation with RVR (HCC) 05/06/2023   AVM (arteriovenous malformation)    CAD (coronary artery disease)    a. LHC 5/12:  LAD 20, pLCx 20, pRCA 40, dRCA 40, EF 35%, diff HK  //  b. Myoview 4/16: Overall Impression:  High risk stress nuclear study There is no evidence of ischemia.  There is severe LV dysfunction. LV Ejection Fraction: 30%.  LV Wall Motion:  There is global LV hypokinesis.     CAP (community acquired pneumonia) 09/2013   Chronic combined systolic and diastolic CHF (congestive heart failure) (HCC)    a. Echo 4/16:Mild LVH, EF 40-45%, diffuse HK //  b. Echo 8/17: EF 35-40%, diffuse HK, diastolic dysfunction, aortic sclerosis, trivial MR, moderate LAE, normal RVSF, moderate RAE, mild TR, PASP 42 mmHg // c. Echo 4/18: Mild concentric LVH, EF 30-35, normal wall motion, grade 1 diastolic dysfunction, PASP 49   Chronic respiratory failure (HCC)    Cluster headache    "hx; haven't had one in awhile" (01/09/2016)   COPD (chronic obstructive pulmonary disease) (HCC)    [name] 01/09/2016   DM (diabetes mellitus) (HCC)    History of CVA (cerebrovascular accident)    Hypertension    IDA (iron deficiency anemia)    Moderate tobacco use disorder    NICM (nonischemic cardiomyopathy) (HCC)    Nicotine addiction    Peripheral vascular disease (HCC)    Syncope 06/11/2019   Tobacco abuse    Type 2 diabetes mellitus (HCC) 05/14/2016    Past Surgical History:  Procedure Laterality Date   ABDOMINAL AORTOGRAM W/LOWER EXTREMITY N/A 12/07/2022   Procedure: ABDOMINAL AORTOGRAM W/LOWER EXTREMITY;  Surgeon: Leonie Douglas, MD;  Location: [facility] INVASIVE CV LAB;  Service: Cardiovascular;  Laterality: N/A;   ABDOMINAL AORTOGRAM W/LOWER EXTREMITY Left 08/02/2023   Procedure: ABDOMINAL AORTOGRAM W/LOWER EXTREMITY;  Surgeon: Leonie Douglas, MD;  Location: [facility] INVASIVE CV LAB;  Service: Cardiovascular;  Laterality: Left;   BIOPSY  11/12/2020   Procedure: BIOPSY;  Surgeon: Lemar Lofty.,  MD;  Location: [facility] ENDOSCOPY;  Service: Gastroenterology;;   CARDIAC CATHETERIZATION  10/2010   LM normal, LAD with 20% irregularities, LCX with 20%, RCA with 40% prox and 40% distal - EF of 35%   CATARACT EXTRACTION, BILATERAL     COLONOSCOPY W/ BIOPSIES AND POLYPECTOMY     COLONOSCOPY WITH PROPOFOL N/A 09/06/2018   Procedure: COLONOSCOPY WITH PROPOFOL;  Surgeon: Tressia Danas, MD;  Location: [facility] ENDOSCOPY;  Service: Gastroenterology;  Laterality: N/A;   ENTEROSCOPY N/A 09/28/2018   Procedure: ENTEROSCOPY;  Surgeon: Jeani Hawking, MD;  Location: [facility] ENDOSCOPY;  Service: Endoscopy;  Laterality: N/A;   ENTEROSCOPY N/A 10/28/2018   Procedure: ENTEROSCOPY;  Surgeon: Tressia Danas, MD;  Location: [facility] ENDOSCOPY;  Service: Gastroenterology;  Laterality: N/A;   ENTEROSCOPY N/A 10/09/2020   Procedure: ENTEROSCOPY;  Surgeon: Hilarie Fredrickson, MD;  Location: [facility] ENDOSCOPY;  Service: Endoscopy;  Laterality: N/A;   ENTEROSCOPY N/A 03/22/2022   Procedure: ENTEROSCOPY;  Surgeon: Benancio Deeds, MD;  Location: [facility] ENDOSCOPY;  Service: Gastroenterology;  Laterality: N/A;   ENTEROSCOPY N/A 05/20/2023   Procedure: ENTEROSCOPY;  Surgeon: Sherrilyn Rist, MD;  Location: [facility] ENDOSCOPY;  Service: Gastroenterology;  Laterality: N/A;   ENTEROSCOPY N/A 08/24/2023   Procedure: ENTEROSCOPY;  Surgeon: Ottie Glazier, MD;  Location: [facility] ENDOSCOPY;  Service: Gastroenterology;  Laterality: N/A;   ESOPHAGOGASTRODUODENOSCOPY N/A 11/12/2020   Procedure: ESOPHAGOGASTRODUODENOSCOPY (EGD);  Surgeon: Lemar Lofty., MD;  Location: [facility] ENDOSCOPY;  Service: Gastroenterology;  Laterality: N/A;   ESOPHAGOGASTRODUODENOSCOPY (EGD) WITH PROPOFOL N/A 09/05/2018   Procedure: ESOPHAGOGASTRODUODENOSCOPY (EGD) WITH PROPOFOL;  Surgeon: Benancio Deeds, MD;  Location: [facility] ENDOSCOPY;  Service: Gastroenterology;  Laterality: N/A;   ESOPHAGOGASTRODUODENOSCOPY (EGD) WITH PROPOFOL N/A 11/19/2020   Procedure: ESOPHAGOGASTRODUODENOSCOPY (EGD)  WITH PROPOFOL;  Surgeon: Beverley Fiedler, MD;  Location: [facility] ENDOSCOPY;  Service: Gastroenterology;  Laterality: N/A;   ESOPHAGOGASTRODUODENOSCOPY (EGD) WITH PROPOFOL N/A 12/27/2022   Procedure: ESOPHAGOGASTRODUODENOSCOPY (EGD) WITH PROPOFOL;  Surgeon: Napoleon Form, MD;  Location: [facility] ENDOSCOPY;  Service: Gastroenterology;  Laterality: N/A;   EXCISION MASS HEAD     GIVENS CAPSULE STUDY N/A 09/06/2018   Procedure: GIVENS CAPSULE STUDY;  Surgeon: Tressia Danas, MD;  Location: [facility] ENDOSCOPY;  Service: Gastroenterology;  Laterality: N/A;   GIVENS CAPSULE STUDY N/A 09/26/2018   Procedure: GIVENS CAPSULE STUDY;  Surgeon: Beverley Fiedler, MD;  Location: [facility] ENDOSCOPY;  Service: Gastroenterology;  Laterality: N/A;   GIVENS CAPSULE STUDY N/A 06/14/2019   Procedure: GIVENS CAPSULE STUDY;  Surgeon: Napoleon Form, MD;  Location: [facility] ENDOSCOPY;  Service: Endoscopy;  Laterality: N/A;   HEMOSTASIS CLIP PLACEMENT  11/12/2020   Procedure: HEMOSTASIS CLIP PLACEMENT;  Surgeon: Lemar Lofty., MD;  Location: [facility] ENDOSCOPY;  Service: Gastroenterology;;   HEMOSTASIS CONTROL  11/12/2020   Procedure: HEMOSTASIS CONTROL;  Surgeon: Lemar Lofty., MD;  Location: [facility] ENDOSCOPY;  Service: Gastroenterology;;   HOT HEMOSTASIS N/A 10/28/2018   Procedure: HOT HEMOSTASIS (ARGON PLASMA COAGULATION/BICAP);  Surgeon: Tressia Danas, MD;  Location: [facility] ENDOSCOPY;  Service: Gastroenterology;  Laterality: N/A;   HOT HEMOSTASIS N/A 11/12/2020   Procedure: HOT HEMOSTASIS (ARGON PLASMA COAGULATION/BICAP);  Surgeon: Lemar Lofty., MD;  Location: [facility] ENDOSCOPY;  Service: Gastroenterology;  Laterality: N/A;   HOT HEMOSTASIS N/A 03/22/2022   Procedure: HOT HEMOSTASIS (ARGON PLASMA COAGULATION/BICAP);  Surgeon: Benancio Deeds, MD;  Location: [facility] ENDOSCOPY;  Service: Gastroenterology;  Laterality: N/A;   HOT HEMOSTASIS N/A 05/20/2023   Procedure: HOT HEMOSTASIS (ARGON PLASMA COAGULATION/BICAP);  Surgeon: Sherrilyn Rist, MD;  Location: [facility] ENDOSCOPY;  Service: Gastroenterology;  Laterality: N/A;   HOT HEMOSTASIS N/A 08/24/2023   Procedure: EGD, WITH ARGON PLASMA COAGULATION;  Surgeon: Ottie Glazier, MD;  Location: [facility] ENDOSCOPY;  Service: Gastroenterology;  Laterality: N/A;   INCISION AND DRAINAGE PERIRECTAL ABSCESS N/A 06/05/2017   Procedure: IRRIGATION AND DEBRIDEMENT PERIRECTAL ABSCESS;  Surgeon: Andria Meuse, MD;  Location: [facility] OR;  Service: General;  Laterality: N/A;   IR CATHETER TUBE CHANGE  05/14/2023   LAPAROSCOPIC APPENDECTOMY N/A 05/01/2023   Procedure: APPENDECTOMY LAPAROSCOPIC;  Surgeon: Emelia Loron, MD;  Location: [facility] OR;  Service: General;  Laterality: N/A;   PERIPHERAL VASCULAR BALLOON ANGIOPLASTY Left 08/02/2023   Procedure: PERIPHERAL VASCULAR BALLOON ANGIOPLASTY;  Surgeon: Leonie Douglas, MD;  Location: [facility] INVASIVE CV LAB;  Service: Cardiovascular;  Laterality: Left;   PERIPHERAL VASCULAR INTERVENTION  12/07/2022   Procedure: PERIPHERAL VASCULAR INTERVENTION;  Surgeon: Leonie Douglas, MD;  Location: [facility] INVASIVE CV LAB;  Service: Cardiovascular;;   PERIPHERAL VASCULAR INTERVENTION Left 08/02/2023   Procedure: PERIPHERAL VASCULAR INTERVENTION;  Surgeon: Leonie Douglas, MD;  Location: [facility] INVASIVE CV LAB;  Service: Cardiovascular;  Laterality: Left;   SUBMUCOSAL TATTOO INJECTION  11/12/2020   Procedure: SUBMUCOSAL TATTOO INJECTION;  Surgeon: Lemar Lofty., MD;  Location: [facility] ENDOSCOPY;  Service: Gastroenterology;;   SUBMUCOSAL TATTOO INJECTION  05/20/2023   Procedure: SUBMUCOSAL TATTOO INJECTION;  Surgeon: Sherrilyn Rist, MD;  Location: [facility] ENDOSCOPY;  Service: Gastroenterology;;   VIDEO BRONCHOSCOPY Bilateral 05/08/2016   Procedure: VIDEO BRONCHOSCOPY WITH FLUORO;  Surgeon: Oretha Milch, MD;  Location: [facility] ENDOSCOPY;  Service: Cardiopulmonary;  Laterality: Bilateral;    Family History  Problem Relation Age of Onset   Heart disease Mother    Diabetes Mother     Colon cancer Mother    Liver cancer Mother    Cancer Father        type unknown   Diabetes Sister        x 2   Diabetes Brother     Social History   Socioeconomic History   Marital status: Single    Spouse name: Not on file   Number of children: 1   Years of education: Not on file   Highest education level: 12th grade  Occupational History   Occupation: retired  Tobacco Use   Smoking status: Every Day    Current packs/day: 0.50    Average packs/day: 0.5 packs/day for 47.0 years (23.5 ttl pk-yrs)    Types: Cigarettes   Smokeless tobacco: Never   Tobacco comments:    4/5 cigs  Vaping Use   Vaping status: Never Used  Substance and Sexual Activity   Alcohol use: Not Currently    Alcohol/week: 0.0 standard drinks of alcohol    Comment: last drink was before xmas   Drug use: No    Types: Cocaine, Marijuana    Comment: "nothing in 20 years"   Sexual activity: Not Currently  Other Topics Concern   Not on file  Social History Narrative   unemployed   Social Drivers of Corporate investment banker  Strain: Low Risk  (06/10/2023)   Overall Financial Resource Strain (CARDIA)    Difficulty of Paying Living Expenses: Not hard at all  Food Insecurity: No Food Insecurity (08/27/2023)   Hunger Vital Sign    Worried About Running Out of Food in the Last Year: Never true    Ran Out of Food in the Last Year: Never true  Recent Concern: Food Insecurity - Food Insecurity Present (06/10/2023)   Hunger Vital Sign    Worried About Running Out of Food in the Last Year: Sometimes true    Ran Out of Food in the Last Year: Never true  Transportation Needs: No Transportation Needs (08/27/2023)   PRAPARE - Administrator, Civil Service (Medical): No    Lack of Transportation (Non-Medical): No  Physical Activity: Insufficiently Active (06/10/2023)   Exercise Vital Sign    Days of Exercise per Week: 3 days    Minutes of Exercise per Session: 20 min  Stress: No Stress Concern  Present (06/10/2023)   [employer] of Occupational Health - Occupational Stress Questionnaire    Feeling of Stress : Not at all  Social Connections: Moderately Isolated (08/22/2023)   Social Connection and Isolation Panel [NHANES]    Frequency of Communication with Friends and Family: More than three times a week    Frequency of Social Gatherings with Friends and Family: Twice a week    Attends Religious Services: Never    Database administrator or Organizations: Yes    Attends Engineer, structural: 1 to 4 times per year    Marital Status: Divorced    Allergies  Allergen Reactions   Lisinopril Cough    Outpatient Medications Prior to Visit  Medication Sig Dispense Refill   acetaminophen (TYLENOL) 500 MG tablet Take 2 tablets (1,000 mg total) by mouth every 8 (eight) hours as needed for moderate pain. 100 tablet 1   albuterol (PROVENTIL) (2.5 MG/3ML) 0.083% nebulizer solution Take 3 mLs (2.5 mg total) by nebulization every 2 (two) hours as needed for wheezing or shortness of breath. (Patient taking differently: Take 2.5 mg by nebulization every 4 (four) hours as needed for wheezing or shortness of breath.)     aspirin EC 81 MG tablet Take 81 mg by mouth daily. Swallow whole.     Budeson-Glycopyrrol-Formoterol (BREZTRI AEROSPHERE) 160-9-4.8 MCG/ACT AERO Take 2 puffs first thing in morning and then another 2 puffs about 12 hours later. (Patient taking differently: Inhale 2 puffs into the lungs in the morning and at bedtime.) 10.7 g 11   ergocalciferol (DRISDOL) 1.25 MG (50000 UT) capsule Take 1 capsule (50,000 Units total) by mouth once a week. 12 capsule 1   ezetimibe (ZETIA) 10 MG tablet Take 1 tablet (10 mg total) by mouth daily. 90 tablet 2   hydrALAZINE (APRESOLINE) 50 MG tablet Take 1 tablet (50 mg total) by mouth every 8 (eight) hours. (Patient taking differently: Take 50 mg by mouth daily.) 270 tablet 1   isosorbide mononitrate (IMDUR) 30 MG 24 hr tablet Take 1 tablet  (30 mg total) by mouth daily. 90 tablet 1   Multiple Vitamin (MULTIVITAMIN WITH MINERALS) TABS tablet Take 1 tablet by mouth daily.     nitroGLYCERIN (NITROSTAT) 0.4 MG SL tablet Place 0.4 mg under the tongue every 5 (five) minutes as needed for chest pain.     OXYGEN Inhale 2 L/min into the lungs continuous.     albuterol (VENTOLIN HFA) 108 (90 Base) MCG/ACT inhaler INHALE 2 PUFFS  INTO THE LUNGS EVERY 6 (SIX) HOURS AS NEEDED FOR WHEEZING OR SHORTNESS OF BREATH.     atorvastatin (LIPITOR) 80 MG tablet Take 1 tablet (80 mg total) by mouth daily. 90 tablet 1   DULoxetine (CYMBALTA) 60 MG capsule Take 1 capsule (60 mg total) by mouth daily. For chronic leg pains 30 capsule 3   ferrous sulfate (FEROSUL) 325 (65 FE) MG tablet Take 1 tablet (325 mg total) by mouth 2 (two) times daily with a meal.     folic acid (FOLVITE) 1 MG tablet Take 1 tablet (1 mg total) by mouth daily. 90 tablet 1   furosemide (LASIX) 40 MG tablet Take 1 tablet (40 mg total) by mouth 2 (two) times daily. 60 tablet 2   gabapentin (NEURONTIN) 300 MG capsule Take 1 capsule (300 mg total) by mouth 2 (two) times daily. 90 capsule 1   metoprolol succinate (TOPROL-XL) 50 MG 24 hr tablet Take 1.5 tablets (75 mg total) by mouth daily. Take with or immediately following a meal. (Patient taking differently: Take 50 mg by mouth daily.)     pantoprazole (PROTONIX) 40 MG tablet Take 1 tablet (40 mg total) by mouth daily. 90 tablet 1   rivaroxaban (XARELTO) 20 MG TABS tablet Take 1 tablet (20 mg total) by mouth daily with supper. 90 tablet 0   No facility-administered medications prior to visit.     ROS Review of Systems  Constitutional:  Negative for activity change and appetite change.  HENT:  Negative for sinus pressure and sore throat.   Respiratory:  Negative for chest tightness, shortness of breath and wheezing.   Cardiovascular:  Negative for chest pain and palpitations.  Gastrointestinal:  Negative for abdominal distention,  abdominal pain and constipation.  Genitourinary: Negative.   Musculoskeletal: Negative.   Psychiatric/Behavioral:  Negative for behavioral problems and dysphoric mood.     Objective:  BP 123/63   Pulse 95   Ht 6\' 3"  (1.905 m)   Wt 196 lb 3.2 oz (89 kg)   SpO2 96%   BMI 24.52 kg/m      09/09/2023    8:41 AM 08/26/2023    4:37 AM 08/25/2023   10:18 PM  BP/Weight  Systolic BP 123 120 116  Diastolic BP 63 74 72  Wt. (Lbs) 196.2    BMI 24.52 kg/m2        Physical Exam Constitutional:      Appearance: He is well-developed.  HENT:     Nose:     Comments: 3 L of oxygen via nasal cannula Cardiovascular:     Rate and Rhythm: Normal rate.     Heart sounds: Normal heart sounds. No murmur heard. Pulmonary:     Effort: Pulmonary effort is normal.     Breath sounds: Normal breath sounds. No wheezing or rales.  Chest:     Chest wall: No tenderness.  Abdominal:     General: Bowel sounds are normal. There is no distension.     Palpations: Abdomen is soft. There is no mass.     Tenderness: There is no abdominal tenderness.  Musculoskeletal:        General: Normal range of motion.     Right lower leg: No edema.     Left lower leg: No edema.  Neurological:     Mental Status: He is alert and oriented to person, place, and time.  Psychiatric:        Mood and Affect: Mood normal.  Latest Ref Rng & Units 08/26/2023    6:15 AM 08/25/2023    7:04 AM 08/24/2023    6:46 AM  CMP  Glucose 70 - 99 mg/dL 657  95  846   BUN 8 - 23 mg/dL 13  12  13    Creatinine 0.61 - 1.24 mg/dL 9.62  9.52  8.41   Sodium 135 - 145 mmol/L 140  143  139   Potassium 3.5 - 5.1 mmol/L 4.5  4.3  4.1   Chloride 98 - 111 mmol/L 101  102  99   CO2 22 - 32 mmol/L 35  31  34   Calcium 8.9 - 10.3 mg/dL 8.6  8.5  8.8     Lipid Panel     Component Value Date/Time   CHOL 127 10/25/2022 1027   TRIG 80 10/25/2022 1027   HDL 49 10/25/2022 1027   CHOLHDL 2.9 01/29/2020 1020   CHOLHDL 2.8 01/11/2016 0446    VLDL 14 01/11/2016 0446   LDLCALC 62 10/25/2022 1027    CBC    Component Value Date/Time   WBC 10.3 08/26/2023 0615   RBC 3.48 (L) 08/26/2023 0615   HGB 8.8 (L) 08/26/2023 0615   HGB 9.9 (L) 07/15/2023 0955   HGB 10.2 (L) 06/10/2023 1012   HCT 30.8 (L) 08/26/2023 0615   HCT 33.9 (L) 06/10/2023 1012   PLT 446 (H) 08/26/2023 0615   PLT 314 07/15/2023 0955   PLT 332 06/10/2023 1012   MCV 88.5 08/26/2023 0615   MCV 87 06/10/2023 1012   MCH 25.3 (L) 08/26/2023 0615   MCHC 28.6 (L) 08/26/2023 0615   RDW 17.1 (H) 08/26/2023 0615   RDW 14.3 06/10/2023 1012   LYMPHSABS 3.3 07/15/2023 0955   LYMPHSABS 2.7 06/10/2023 1012   MONOABS 0.8 07/15/2023 0955   EOSABS 0.1 07/15/2023 0955   EOSABS 0.1 06/10/2023 1012   BASOSABS 0.0 07/15/2023 0955   BASOSABS 0.1 06/10/2023 1012    Lab Results  Component Value Date   HGBA1C 5.4 06/10/2023       Assessment & Plan Anemia Chronic anemia with recent hospitalization due to hemoglobin level of 6.1. Non-bleeding angioectasias treated previously. Iron infusions were administered three times a week for a month, but have since been stopped. Current plan involves monitoring hemoglobin levels monthly due to inconsistent scheduling of infusions by the cancer center. No clear source of bleeding identified despite gastrointestinal evaluation. -Chart hemoglobin was 8.8, he has an upcoming appointment at the cancer center in 3 days to have his CBC done - Order CBC in May and July to monitor hemoglobin levels - Coordinate with cancer center for iron infusions and blood tests  Gastrointestinal Bleed Gastrointestinal bleeding with non-bleeding angiodysplasia. No current evidence of active bleeding. Reports no blood in stool, urine, or other orifices. Ongoing monitoring necessary due to unexplained anemia. - Ensure follow-up with gastroenterologist for ongoing management  Hypertensive heart disease Euvolemic with EF of 55 to 60% Currently on metoprolol.  Hydralazine and isosorbide were held post-hospitalization.  -Will hold off on restarting these due to soft blood pressure until he sees cardiology.  No current symptoms of heart failure or atrial fibrillation. Importance of cardiology follow-up emphasized to prevent complications from medication changes. - Refer to cardiologist for medication review and management - Continue metoprolol - Monitor for symptoms of heart failure or arrhythmias  Hypertension Blood pressure well-controlled at 123/63 mmHg. Hydralazine was held due to low blood pressure during hospitalization. Current regimen includes metoprolol and other antihypertensives.  Risk of hypotension if hydralazine is restarted without cardiology input. - Continue to hold hydralazine and isosorbide - Monitor blood pressure regularly - Ensure he follows up with cardiologist for medication management  Atrial flutter/atrial fibrillation Currently in sinus rhythm -Continue Xarelto   Chronic Obstructive Pulmonary Disease (COPD) On continuous oxygen therapy at 3 L/min. No acute exacerbations reported. Well-managed respiratory status. - Continue oxygen therapy at 3 L/min - Monitor respiratory status and adjust oxygen as needed  General Health Maintenance History of smoking . Last screening was in August 2024. - Schedule lung cancer screening for August 2025, this is usually ordered by pulmonary.  Follow-up Multiple follow-up appointments scheduled with various specialists. - Ensure follow-up with cancer center on April 3rd for blood test - Schedule follow-up with cardiologist - Ensure follow-up with gastroenterologist      Meds ordered this encounter  Medications   albuterol (VENTOLIN HFA) 108 (90 Base) MCG/ACT inhaler    Sig: INHALE 2 PUFFS INTO THE LUNGS EVERY 6 (SIX) HOURS AS NEEDED FOR WHEEZING OR SHORTNESS OF BREATH.    Dispense:  18 g    Refill:  6   atorvastatin (LIPITOR) 80 MG tablet    Sig: Take 1 tablet (80 mg total)  by mouth daily.    Dispense:  90 tablet    Refill:  1   DULoxetine (CYMBALTA) 60 MG capsule    Sig: Take 1 capsule (60 mg total) by mouth daily. For chronic leg pains    Dispense:  90 capsule    Refill:  1   ferrous sulfate (FEROSUL) 325 (65 FE) MG tablet    Sig: Take 1 tablet (325 mg total) by mouth 2 (two) times daily with a meal.    Dispense:  180 tablet    Refill:  1   folic acid (FOLVITE) 1 MG tablet    Sig: Take 1 tablet (1 mg total) by mouth daily.    Dispense:  90 tablet    Refill:  1   furosemide (LASIX) 40 MG tablet    Sig: Take 1 tablet (40 mg total) by mouth 2 (two) times daily.    Dispense:  180 tablet    Refill:  1   gabapentin (NEURONTIN) 300 MG capsule    Sig: Take 1 capsule (300 mg total) by mouth 2 (two) times daily.    Dispense:  90 capsule    Refill:  1   metoprolol succinate (TOPROL-XL) 50 MG 24 hr tablet    Sig: Take 1 tablet (50 mg total) by mouth daily.    Dispense:  90 tablet    Refill:  1   pantoprazole (PROTONIX) 40 MG tablet    Sig: Take 1 tablet (40 mg total) by mouth daily.    Dispense:  90 tablet    Refill:  1   rivaroxaban (XARELTO) 20 MG TABS tablet    Sig: Take 1 tablet (20 mg total) by mouth daily with supper.    Dispense:  90 tablet    Refill:  1    Follow-up: Return in about 3 months (around 12/09/2023) for Chronic medical conditions.       [name], MD, FAAFP. [name] [place], [place] [phone]   09/09/2023, 1:41 PM

## 2023-09-11 ENCOUNTER — Other Ambulatory Visit: Payer: Self-pay

## 2023-09-11 DIAGNOSIS — I4892 Unspecified atrial flutter: Secondary | ICD-10-CM | POA: Diagnosis not present

## 2023-09-11 DIAGNOSIS — I110 Hypertensive heart disease with heart failure: Secondary | ICD-10-CM | POA: Diagnosis not present

## 2023-09-11 DIAGNOSIS — D500 Iron deficiency anemia secondary to blood loss (chronic): Secondary | ICD-10-CM | POA: Diagnosis not present

## 2023-09-11 DIAGNOSIS — E1151 Type 2 diabetes mellitus with diabetic peripheral angiopathy without gangrene: Secondary | ICD-10-CM | POA: Diagnosis not present

## 2023-09-11 DIAGNOSIS — I480 Paroxysmal atrial fibrillation: Secondary | ICD-10-CM | POA: Diagnosis not present

## 2023-09-11 DIAGNOSIS — I5020 Unspecified systolic (congestive) heart failure: Secondary | ICD-10-CM | POA: Diagnosis not present

## 2023-09-11 DIAGNOSIS — J449 Chronic obstructive pulmonary disease, unspecified: Secondary | ICD-10-CM | POA: Diagnosis not present

## 2023-09-11 DIAGNOSIS — I5032 Chronic diastolic (congestive) heart failure: Secondary | ICD-10-CM | POA: Diagnosis not present

## 2023-09-11 DIAGNOSIS — I2510 Atherosclerotic heart disease of native coronary artery without angina pectoris: Secondary | ICD-10-CM | POA: Diagnosis not present

## 2023-09-12 ENCOUNTER — Inpatient Hospital Stay: Payer: Medicare HMO | Attending: Nurse Practitioner

## 2023-09-12 DIAGNOSIS — K274 Chronic or unspecified peptic ulcer, site unspecified, with hemorrhage: Secondary | ICD-10-CM | POA: Diagnosis not present

## 2023-09-12 DIAGNOSIS — I5032 Chronic diastolic (congestive) heart failure: Secondary | ICD-10-CM | POA: Diagnosis not present

## 2023-09-12 DIAGNOSIS — I5020 Unspecified systolic (congestive) heart failure: Secondary | ICD-10-CM | POA: Diagnosis not present

## 2023-09-12 DIAGNOSIS — E1151 Type 2 diabetes mellitus with diabetic peripheral angiopathy without gangrene: Secondary | ICD-10-CM | POA: Diagnosis not present

## 2023-09-12 DIAGNOSIS — I110 Hypertensive heart disease with heart failure: Secondary | ICD-10-CM | POA: Diagnosis not present

## 2023-09-12 DIAGNOSIS — D500 Iron deficiency anemia secondary to blood loss (chronic): Secondary | ICD-10-CM | POA: Diagnosis not present

## 2023-09-12 DIAGNOSIS — J449 Chronic obstructive pulmonary disease, unspecified: Secondary | ICD-10-CM | POA: Diagnosis not present

## 2023-09-12 DIAGNOSIS — I4892 Unspecified atrial flutter: Secondary | ICD-10-CM | POA: Diagnosis not present

## 2023-09-12 DIAGNOSIS — I480 Paroxysmal atrial fibrillation: Secondary | ICD-10-CM | POA: Diagnosis not present

## 2023-09-12 DIAGNOSIS — I2510 Atherosclerotic heart disease of native coronary artery without angina pectoris: Secondary | ICD-10-CM | POA: Diagnosis not present

## 2023-09-12 LAB — CBC WITH DIFFERENTIAL (CANCER CENTER ONLY)
Abs Immature Granulocytes: 0.03 10*3/uL (ref 0.00–0.07)
Basophils Absolute: 0.1 10*3/uL (ref 0.0–0.1)
Basophils Relative: 1 %
Eosinophils Absolute: 0.1 10*3/uL (ref 0.0–0.5)
Eosinophils Relative: 1 %
HCT: 27.8 % — ABNORMAL LOW (ref 39.0–52.0)
Hemoglobin: 7.7 g/dL — ABNORMAL LOW (ref 13.0–17.0)
Immature Granulocytes: 0 %
Lymphocytes Relative: 28 %
Lymphs Abs: 2.1 10*3/uL (ref 0.7–4.0)
MCH: 23.7 pg — ABNORMAL LOW (ref 26.0–34.0)
MCHC: 27.7 g/dL — ABNORMAL LOW (ref 30.0–36.0)
MCV: 85.5 fL (ref 80.0–100.0)
Monocytes Absolute: 0.6 10*3/uL (ref 0.1–1.0)
Monocytes Relative: 8 %
Neutro Abs: 4.7 10*3/uL (ref 1.7–7.7)
Neutrophils Relative %: 62 %
Platelet Count: 355 10*3/uL (ref 150–400)
RBC: 3.25 MIL/uL — ABNORMAL LOW (ref 4.22–5.81)
RDW: 18.9 % — ABNORMAL HIGH (ref 11.5–15.5)
WBC Count: 7.7 10*3/uL (ref 4.0–10.5)
nRBC: 0 % (ref 0.0–0.2)

## 2023-09-12 LAB — FERRITIN: Ferritin: 8 ng/mL — ABNORMAL LOW (ref 24–336)

## 2023-09-12 LAB — IRON AND IRON BINDING CAPACITY (CC-WL,HP ONLY)
Iron: 11 ug/dL — ABNORMAL LOW (ref 45–182)
Saturation Ratios: 3 % — ABNORMAL LOW (ref 17.9–39.5)
TIBC: 391 ug/dL (ref 250–450)
UIBC: 380 ug/dL — ABNORMAL HIGH (ref 117–376)

## 2023-09-16 DIAGNOSIS — D500 Iron deficiency anemia secondary to blood loss (chronic): Secondary | ICD-10-CM | POA: Diagnosis not present

## 2023-09-16 DIAGNOSIS — I110 Hypertensive heart disease with heart failure: Secondary | ICD-10-CM | POA: Diagnosis not present

## 2023-09-16 DIAGNOSIS — I5020 Unspecified systolic (congestive) heart failure: Secondary | ICD-10-CM | POA: Diagnosis not present

## 2023-09-16 DIAGNOSIS — I480 Paroxysmal atrial fibrillation: Secondary | ICD-10-CM | POA: Diagnosis not present

## 2023-09-16 DIAGNOSIS — I4892 Unspecified atrial flutter: Secondary | ICD-10-CM | POA: Diagnosis not present

## 2023-09-16 DIAGNOSIS — I5032 Chronic diastolic (congestive) heart failure: Secondary | ICD-10-CM | POA: Diagnosis not present

## 2023-09-16 DIAGNOSIS — I2510 Atherosclerotic heart disease of native coronary artery without angina pectoris: Secondary | ICD-10-CM | POA: Diagnosis not present

## 2023-09-16 DIAGNOSIS — J449 Chronic obstructive pulmonary disease, unspecified: Secondary | ICD-10-CM | POA: Diagnosis not present

## 2023-09-16 DIAGNOSIS — E1151 Type 2 diabetes mellitus with diabetic peripheral angiopathy without gangrene: Secondary | ICD-10-CM | POA: Diagnosis not present

## 2023-09-18 ENCOUNTER — Telehealth: Payer: Self-pay

## 2023-09-18 DIAGNOSIS — Z7902 Long term (current) use of antithrombotics/antiplatelets: Secondary | ICD-10-CM

## 2023-09-18 DIAGNOSIS — K5520 Angiodysplasia of colon without hemorrhage: Secondary | ICD-10-CM

## 2023-09-18 DIAGNOSIS — D500 Iron deficiency anemia secondary to blood loss (chronic): Secondary | ICD-10-CM

## 2023-09-18 NOTE — Telephone Encounter (Signed)
 Called and spoke with patient to review recommendations for VCE as outlined below. Patient has been scheduled for VCE on Monday, 09/30/23 at 8:30 am. Patient confirmed that he has access to MyChart and knows to expect VCE instructions there, will mail a copy as well. Patient verbalized understanding and had no concerns at the end of the call.   VCE order form completed. VCE instructions mailed and sent to patient via MyChart. Ambulatory referral to GI in epic.

## 2023-09-18 NOTE — Telephone Encounter (Signed)
-----   Message from Ottie Glazier sent at 09/17/2023 12:29 PM EDT ----- Jani Gravel -  Thank you for your message regarding Jared [name].  I am sorry to hear he is still having issues with ongoing anemia.  It looks like his last video capsule endoscopy was performed in 2021.  Given that several years have passed in the interim it would be reasonable to update his video capsule study. I will have the office reach out to him to coordinate an outpatient video capsule.   Sometimes we have more luck catching an active bleed when VCE's are done in the outpatient setting while patients are still taking their anticoagulants.  When VCEs are performed during hospital admission for bleeding, more often than not a patient's anticoagulants have been discontinued and we miss our window of opportunity to catch pathology.  His last colonoscopy was performed 5 years ago and was normal.  I think there would be less utility to a colonoscopy at this time unless we are not finding other sources.  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  A video capsule could help [place] determine that.  POD B nurses -please contact Jared [name] to coordinate outpatient video capsule endoscopy and let me know if he has any questions.  He has had video capsules at least twice before in the inpatient setting so he is somewhat familiar with the test.  Best,  Maren Beach ----- Message ----- From: Pollyann Samples, NP Sent: 09/17/2023  10:30 AM EDT To: Malachy Mood, MD; Ottie Glazier, MD  Hi Dr. Doy Hutching,  Jared [name]' recent labs so persistent IDA, hgb dropped to 7.7 (8.8 last month in the hospital). 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  He received full 1g IV iron replacement in Feb but he did not respond well.   Thank you Lacie NP  @[facility]

## 2023-09-19 ENCOUNTER — Other Ambulatory Visit: Payer: Self-pay | Admitting: Nurse Practitioner

## 2023-09-19 ENCOUNTER — Other Ambulatory Visit: Payer: Self-pay

## 2023-09-20 ENCOUNTER — Telehealth: Payer: Self-pay

## 2023-09-20 ENCOUNTER — Other Ambulatory Visit: Payer: Self-pay

## 2023-09-20 NOTE — Addendum Note (Signed)
 Addended by: Ihor Austin D on: 09/20/2023 03:14 PM   Modules accepted: Orders

## 2023-09-20 NOTE — Telephone Encounter (Signed)
 Called patient and patients sister to relay the message below as per Santiago Glad NP. Did not get an answer from the patient so I contacted his sister and left her a message to call us and let us know if he is willing to do the iron infusion. She voiced full understanding and will have him call us back. No further questions at this time.    Pollyann Samples, NP  [facility] My nurse, Please call patient to check for bleeding, he is on multiple blood thinner/anti-platelet meds. He did not respond to venofer, he remains iron deficient and anemic. We have communicated with his GI who should be in touch with him about capsule endoscopy, but we still need to treat IDA. I recommend trying feraheme or monoferric. If he agrees, I will place orders for [facility].  Thanks Lacie NP

## 2023-09-20 NOTE — Telephone Encounter (Signed)
 Patient called back no bleeding and he is ok to do the infusions at [facility]. Orders can be called in for him. Patient voiced full understanding and also stated that his GI doctor has already contacted him.   Pollyann Samples, NP  [facility] My nurse, Please call patient to check for bleeding, he is on multiple blood thinner/anti-platelet meds. He did not respond to venofer, he remains iron deficient and anemic. We have communicated with his GI who should be in touch with him about capsule endoscopy, but we still need to treat IDA. I recommend trying feraheme or monoferric. If he agrees, I will place orders for [facility].  Thanks Lacie NP

## 2023-09-20 NOTE — Telephone Encounter (Signed)
 Lacie, please see denial reason.  Auth Submission: DENIED Site of care: Site of care: [facility] Payer: [employer] medicare Medication & CPT/J Code(s) submitted: Feraheme (ferumoxytol) [id number]  Authorization has been DENIED because the patient has not tried and failed infed. They are aware of the Venofer failure but also require the patient to fail infed. We do not give infed here at [facility].

## 2023-09-23 ENCOUNTER — Other Ambulatory Visit: Payer: Self-pay | Admitting: Nurse Practitioner

## 2023-09-23 ENCOUNTER — Telehealth: Payer: Self-pay | Admitting: Pediatrics

## 2023-09-23 ENCOUNTER — Telehealth: Payer: Self-pay | Admitting: Internal Medicine

## 2023-09-23 ENCOUNTER — Telehealth: Payer: Self-pay | Admitting: Hematology

## 2023-09-23 NOTE — Telephone Encounter (Signed)
 Returned call to patient. Patient has been advised that he is ok to proceed with iron infusion later this week. Patient mentioned that he does not take Carafate, advised that those are standard instructions for VCE and can disregard since that does not apply to him. Patient verbalized understanding and had no concerns at the end of the call.

## 2023-09-23 NOTE — Telephone Encounter (Signed)
 Inbound call from patient stating he was advised to stop iron supplements starting today for 4/21 capsule endo. States he is scheduled for iron infusion this Friday 4/28 at the [facility]. Requesting a call to discuss how to proceed. Please advise, thank you.

## 2023-09-23 NOTE — Telephone Encounter (Signed)
 Error

## 2023-09-23 NOTE — Telephone Encounter (Signed)
 Confirmed iron infusion with the patient and is aware of the appointment details.

## 2023-09-25 DIAGNOSIS — I5020 Unspecified systolic (congestive) heart failure: Secondary | ICD-10-CM | POA: Diagnosis not present

## 2023-09-25 DIAGNOSIS — D500 Iron deficiency anemia secondary to blood loss (chronic): Secondary | ICD-10-CM | POA: Diagnosis not present

## 2023-09-25 DIAGNOSIS — E1151 Type 2 diabetes mellitus with diabetic peripheral angiopathy without gangrene: Secondary | ICD-10-CM | POA: Diagnosis not present

## 2023-09-25 DIAGNOSIS — I4892 Unspecified atrial flutter: Secondary | ICD-10-CM | POA: Diagnosis not present

## 2023-09-25 DIAGNOSIS — I480 Paroxysmal atrial fibrillation: Secondary | ICD-10-CM | POA: Diagnosis not present

## 2023-09-25 DIAGNOSIS — I2510 Atherosclerotic heart disease of native coronary artery without angina pectoris: Secondary | ICD-10-CM | POA: Diagnosis not present

## 2023-09-25 DIAGNOSIS — I110 Hypertensive heart disease with heart failure: Secondary | ICD-10-CM | POA: Diagnosis not present

## 2023-09-25 DIAGNOSIS — I5032 Chronic diastolic (congestive) heart failure: Secondary | ICD-10-CM | POA: Diagnosis not present

## 2023-09-25 DIAGNOSIS — J449 Chronic obstructive pulmonary disease, unspecified: Secondary | ICD-10-CM | POA: Diagnosis not present

## 2023-09-26 ENCOUNTER — Telehealth: Payer: Self-pay | Admitting: Hematology

## 2023-09-26 ENCOUNTER — Telehealth: Payer: Self-pay

## 2023-09-26 NOTE — Telephone Encounter (Signed)
 Patient scheduled appointments. Patient is aware of all appointment details.

## 2023-09-26 NOTE — Telephone Encounter (Signed)
-----   Message from April M sent at 09/26/2023  1:44 PM EDT ----- Regarding: RE: CAPSULE [id number] AUTHORIZED- GOOD UNTIL MAY 31 ----- Message ----- From: Truddie Furrow, MD Sent: 09/26/2023  12:06 PM EDT To: April D McPeak; Marline Simons, RN Subject: RE: CAPSULE                                    I am out of the office this afternoon but will have my phone handy to do the peer to peer - will stay on task with this.  Will message or secure chat you once it is done so we can keep the VCE on Monday.  Haskell Linker ----- Message ----- FromMart Skill Sent: 09/26/2023  10:53 AM EDT To: Truddie Furrow, MD Subject: RE: CAPSULE                                    Thank you so much for your help. ----- Message ----- From: Truddie Furrow, MD Sent: 09/26/2023  10:48 AM EDT To: April D McPeak; Marline Simons, RN Subject: RE: CAPSULE                                    I called [employer] and spoke with the representative to do a peer-to-peer today or tomorrow.  I informed them that the procedure scheduled for Monday.  I am awaiting a callback either this afternoon or tomorrow for the peer to peer.  Haskell Linker ----- Message ----- FromLinzie Rickers D Sent: 09/26/2023   9:15 AM EDT To: Marline Simons, RN; Truddie Furrow, MD Subject: CAPSULE                                        Good Morning, Spoke with [employer] rep and they are requesting a peer-to-peer. They advised me they cannot authorize the capsule with the records we sent? (Makes no sense)   His apt is Monday so I'm pretty sure we will have to R/S unless we can do the peer-to-peer today. [phone]. --- Tracking #[id number]---      Member ID: [id number]  Thanks.

## 2023-09-27 ENCOUNTER — Other Ambulatory Visit: Payer: Self-pay

## 2023-09-27 ENCOUNTER — Inpatient Hospital Stay

## 2023-09-27 VITALS — BP 131/72 | HR 86 | Temp 97.8°F | Resp 16

## 2023-09-27 DIAGNOSIS — D500 Iron deficiency anemia secondary to blood loss (chronic): Secondary | ICD-10-CM | POA: Diagnosis not present

## 2023-09-27 DIAGNOSIS — K274 Chronic or unspecified peptic ulcer, site unspecified, with hemorrhage: Secondary | ICD-10-CM | POA: Diagnosis not present

## 2023-09-27 MED ORDER — IRON DEXTRAN 50 MG/ML IJ SOLN
975.0000 mg | Freq: Once | INTRAMUSCULAR | Status: AC
  Administered 2023-09-27: 975 mg via INTRAVENOUS
  Filled 2023-09-27: qty 19.5

## 2023-09-27 MED ORDER — METHYLPREDNISOLONE SODIUM SUCC 125 MG IJ SOLR
125.0000 mg | Freq: Once | INTRAMUSCULAR | Status: AC
  Administered 2023-09-27: 125 mg via INTRAVENOUS
  Filled 2023-09-27: qty 2

## 2023-09-27 MED ORDER — ACETAMINOPHEN 325 MG PO TABS
650.0000 mg | ORAL_TABLET | Freq: Once | ORAL | Status: AC
  Administered 2023-09-27: 650 mg via ORAL
  Filled 2023-09-27: qty 2

## 2023-09-27 MED ORDER — SODIUM CHLORIDE 0.9 % IV SOLN
25.0000 mg | Freq: Once | INTRAVENOUS | Status: AC
  Administered 2023-09-27: 25 mg via INTRAVENOUS
  Filled 2023-09-27: qty 0.5

## 2023-09-27 MED ORDER — FAMOTIDINE IN NACL 20-0.9 MG/50ML-% IV SOLN
20.0000 mg | Freq: Once | INTRAVENOUS | Status: AC
  Administered 2023-09-27: 20 mg via INTRAVENOUS
  Filled 2023-09-27: qty 50

## 2023-09-27 MED ORDER — SODIUM CHLORIDE 0.9 % IV SOLN: INTRAVENOUS | Status: DC

## 2023-09-27 NOTE — Progress Notes (Signed)
 Pt observed for 30 minutes post Infed  infusion. Pt tolerated Tx well w/out incident. VSS at discharge.  Ambulatory w/ walker and O2 support to lobby.

## 2023-09-27 NOTE — Progress Notes (Signed)
 Per the [facility] Infed  administration instructions stated Titrate to tolerance. Doses < 1000 mg over 2-4 hours. Pt's ordered dose of Infed  975 mg. Total volume of infusion 519.5 mL.   This RN titrated Infed  as shown below: At 0954 infusion started at a rate of 130 mL/hr over 4 hours.  At 1032 infusion rate increased to 173 mL/hr over 3 hours.  At 1106 infusion rate increased to 200 mL/hr over 2.5 hours (this was the test dose rate).  At 1136 infusion rate increased to 260 mL/hr over 2 hours.  Each rate was obtained by taking the total infusion volume divided by hour.  See flowsheets for VS checks at each rate increase.

## 2023-09-27 NOTE — Progress Notes (Signed)
 Per [facility] OK to only wait 30 minutes after test dose of Infed  prior to starting premedications today.  Pt observed for 30 minutes post test dose of Infed . Pt tolerated Tx well w/out incident. VSS prior to starting premedications.  Pharmacy made aware, and prep for the rest of the Infed  infusion started.

## 2023-09-27 NOTE — Patient Instructions (Signed)
 Iron Dextran Injection What is this medication? IRON DEXTRAN (EYE ern DEX tran) treats low levels of iron in your body. Iron is a mineral that plays an important role in making red blood cells, which carry oxygen from your lungs to the rest of your body.

## 2023-09-30 ENCOUNTER — Ambulatory Visit (INDEPENDENT_AMBULATORY_CARE_PROVIDER_SITE_OTHER): Admitting: Pediatrics

## 2023-09-30 DIAGNOSIS — D500 Iron deficiency anemia secondary to blood loss (chronic): Secondary | ICD-10-CM | POA: Diagnosis not present

## 2023-09-30 DIAGNOSIS — K31819 Angiodysplasia of stomach and duodenum without bleeding: Secondary | ICD-10-CM

## 2023-09-30 DIAGNOSIS — I5032 Chronic diastolic (congestive) heart failure: Secondary | ICD-10-CM | POA: Diagnosis not present

## 2023-09-30 DIAGNOSIS — I2510 Atherosclerotic heart disease of native coronary artery without angina pectoris: Secondary | ICD-10-CM | POA: Diagnosis not present

## 2023-09-30 DIAGNOSIS — E1151 Type 2 diabetes mellitus with diabetic peripheral angiopathy without gangrene: Secondary | ICD-10-CM | POA: Diagnosis not present

## 2023-09-30 DIAGNOSIS — D509 Iron deficiency anemia, unspecified: Secondary | ICD-10-CM | POA: Diagnosis not present

## 2023-09-30 DIAGNOSIS — I4892 Unspecified atrial flutter: Secondary | ICD-10-CM | POA: Diagnosis not present

## 2023-09-30 DIAGNOSIS — I5020 Unspecified systolic (congestive) heart failure: Secondary | ICD-10-CM | POA: Diagnosis not present

## 2023-09-30 DIAGNOSIS — I480 Paroxysmal atrial fibrillation: Secondary | ICD-10-CM | POA: Diagnosis not present

## 2023-09-30 DIAGNOSIS — J449 Chronic obstructive pulmonary disease, unspecified: Secondary | ICD-10-CM | POA: Diagnosis not present

## 2023-09-30 DIAGNOSIS — I110 Hypertensive heart disease with heart failure: Secondary | ICD-10-CM | POA: Diagnosis not present

## 2023-09-30 NOTE — Progress Notes (Signed)
 Capsule ID: [id number] Exp: 2024-03-02 LOT: [id number]  Patient arrived for Capsule Endoscopy. Reported the prep went well. This nurse explained dietary restrictions for the next few hours. Patient verbalized understanding. Opened capsule, ensured capsule was flashing prior to the patient swallowing the capsule. Patient swallowed capsule without difficulty. Patient instructed to return to the office at 4:00 pm today for removal of the recording equipment, to call the office with any questions and if no capsule was visualized after 72 hours. No further questions by the conclusion of the visit.

## 2023-09-30 NOTE — Patient Instructions (Signed)
 You may have a clear liquid diet at 10:30 am.  You may have a light lunch beginning at 12:30 ( ex 1/2 sandwich and a bowl of soup, eggs and toast).  You may resume your normal diet at 5:00 pm  If you experience any abdominal pain, nausea ,or vomiting after ingesting the capsule please call [phone] to notify the nurse - Vertell Gory, RN  You may not have an MRI until you have confirmation that you have passed the capsule.   The capsule may pass within 8-72 hours. If you have not seen the capsule after 72 hours please call the office and we will order an x-ray.   Please return to the office at 4 pm today to return equipment.  We will call with results in about 2 weeks.

## 2023-10-01 DIAGNOSIS — E1151 Type 2 diabetes mellitus with diabetic peripheral angiopathy without gangrene: Secondary | ICD-10-CM | POA: Diagnosis not present

## 2023-10-01 DIAGNOSIS — D500 Iron deficiency anemia secondary to blood loss (chronic): Secondary | ICD-10-CM | POA: Diagnosis not present

## 2023-10-01 DIAGNOSIS — I2510 Atherosclerotic heart disease of native coronary artery without angina pectoris: Secondary | ICD-10-CM | POA: Diagnosis not present

## 2023-10-01 DIAGNOSIS — I5032 Chronic diastolic (congestive) heart failure: Secondary | ICD-10-CM | POA: Diagnosis not present

## 2023-10-01 DIAGNOSIS — I5020 Unspecified systolic (congestive) heart failure: Secondary | ICD-10-CM | POA: Diagnosis not present

## 2023-10-01 DIAGNOSIS — I4892 Unspecified atrial flutter: Secondary | ICD-10-CM | POA: Diagnosis not present

## 2023-10-01 DIAGNOSIS — J449 Chronic obstructive pulmonary disease, unspecified: Secondary | ICD-10-CM | POA: Diagnosis not present

## 2023-10-01 DIAGNOSIS — I110 Hypertensive heart disease with heart failure: Secondary | ICD-10-CM | POA: Diagnosis not present

## 2023-10-01 DIAGNOSIS — I480 Paroxysmal atrial fibrillation: Secondary | ICD-10-CM | POA: Diagnosis not present

## 2023-10-03 DIAGNOSIS — I2510 Atherosclerotic heart disease of native coronary artery without angina pectoris: Secondary | ICD-10-CM | POA: Diagnosis not present

## 2023-10-03 DIAGNOSIS — I5020 Unspecified systolic (congestive) heart failure: Secondary | ICD-10-CM | POA: Diagnosis not present

## 2023-10-03 DIAGNOSIS — I4892 Unspecified atrial flutter: Secondary | ICD-10-CM | POA: Diagnosis not present

## 2023-10-03 DIAGNOSIS — J449 Chronic obstructive pulmonary disease, unspecified: Secondary | ICD-10-CM | POA: Diagnosis not present

## 2023-10-03 DIAGNOSIS — D500 Iron deficiency anemia secondary to blood loss (chronic): Secondary | ICD-10-CM | POA: Diagnosis not present

## 2023-10-03 DIAGNOSIS — I480 Paroxysmal atrial fibrillation: Secondary | ICD-10-CM | POA: Diagnosis not present

## 2023-10-03 DIAGNOSIS — I5032 Chronic diastolic (congestive) heart failure: Secondary | ICD-10-CM | POA: Diagnosis not present

## 2023-10-03 DIAGNOSIS — E1151 Type 2 diabetes mellitus with diabetic peripheral angiopathy without gangrene: Secondary | ICD-10-CM | POA: Diagnosis not present

## 2023-10-03 DIAGNOSIS — I110 Hypertensive heart disease with heart failure: Secondary | ICD-10-CM | POA: Diagnosis not present

## 2023-10-07 DIAGNOSIS — J449 Chronic obstructive pulmonary disease, unspecified: Secondary | ICD-10-CM | POA: Diagnosis not present

## 2023-10-07 DIAGNOSIS — I5032 Chronic diastolic (congestive) heart failure: Secondary | ICD-10-CM | POA: Diagnosis not present

## 2023-10-07 DIAGNOSIS — I5020 Unspecified systolic (congestive) heart failure: Secondary | ICD-10-CM | POA: Diagnosis not present

## 2023-10-07 DIAGNOSIS — I480 Paroxysmal atrial fibrillation: Secondary | ICD-10-CM | POA: Diagnosis not present

## 2023-10-07 DIAGNOSIS — E1151 Type 2 diabetes mellitus with diabetic peripheral angiopathy without gangrene: Secondary | ICD-10-CM | POA: Diagnosis not present

## 2023-10-07 DIAGNOSIS — D500 Iron deficiency anemia secondary to blood loss (chronic): Secondary | ICD-10-CM | POA: Diagnosis not present

## 2023-10-07 DIAGNOSIS — I2510 Atherosclerotic heart disease of native coronary artery without angina pectoris: Secondary | ICD-10-CM | POA: Diagnosis not present

## 2023-10-07 DIAGNOSIS — I4892 Unspecified atrial flutter: Secondary | ICD-10-CM | POA: Diagnosis not present

## 2023-10-07 DIAGNOSIS — I110 Hypertensive heart disease with heart failure: Secondary | ICD-10-CM | POA: Diagnosis not present

## 2023-10-10 DIAGNOSIS — I110 Hypertensive heart disease with heart failure: Secondary | ICD-10-CM | POA: Diagnosis not present

## 2023-10-10 DIAGNOSIS — J449 Chronic obstructive pulmonary disease, unspecified: Secondary | ICD-10-CM | POA: Diagnosis not present

## 2023-10-10 DIAGNOSIS — D500 Iron deficiency anemia secondary to blood loss (chronic): Secondary | ICD-10-CM | POA: Diagnosis not present

## 2023-10-10 DIAGNOSIS — I4892 Unspecified atrial flutter: Secondary | ICD-10-CM | POA: Diagnosis not present

## 2023-10-10 DIAGNOSIS — E1151 Type 2 diabetes mellitus with diabetic peripheral angiopathy without gangrene: Secondary | ICD-10-CM | POA: Diagnosis not present

## 2023-10-10 DIAGNOSIS — I5020 Unspecified systolic (congestive) heart failure: Secondary | ICD-10-CM | POA: Diagnosis not present

## 2023-10-10 DIAGNOSIS — I480 Paroxysmal atrial fibrillation: Secondary | ICD-10-CM | POA: Diagnosis not present

## 2023-10-10 DIAGNOSIS — I5032 Chronic diastolic (congestive) heart failure: Secondary | ICD-10-CM | POA: Diagnosis not present

## 2023-10-10 DIAGNOSIS — I2510 Atherosclerotic heart disease of native coronary artery without angina pectoris: Secondary | ICD-10-CM | POA: Diagnosis not present

## 2023-10-14 ENCOUNTER — Other Ambulatory Visit: Payer: Self-pay | Admitting: Family Medicine

## 2023-10-14 ENCOUNTER — Other Ambulatory Visit: Payer: Self-pay

## 2023-10-14 DIAGNOSIS — J449 Chronic obstructive pulmonary disease, unspecified: Secondary | ICD-10-CM | POA: Diagnosis not present

## 2023-10-14 DIAGNOSIS — I5020 Unspecified systolic (congestive) heart failure: Secondary | ICD-10-CM | POA: Diagnosis not present

## 2023-10-14 DIAGNOSIS — E1151 Type 2 diabetes mellitus with diabetic peripheral angiopathy without gangrene: Secondary | ICD-10-CM | POA: Diagnosis not present

## 2023-10-14 DIAGNOSIS — I110 Hypertensive heart disease with heart failure: Secondary | ICD-10-CM | POA: Diagnosis not present

## 2023-10-14 DIAGNOSIS — I4892 Unspecified atrial flutter: Secondary | ICD-10-CM | POA: Diagnosis not present

## 2023-10-14 DIAGNOSIS — D500 Iron deficiency anemia secondary to blood loss (chronic): Secondary | ICD-10-CM | POA: Diagnosis not present

## 2023-10-14 DIAGNOSIS — I5032 Chronic diastolic (congestive) heart failure: Secondary | ICD-10-CM | POA: Diagnosis not present

## 2023-10-14 DIAGNOSIS — I2510 Atherosclerotic heart disease of native coronary artery without angina pectoris: Secondary | ICD-10-CM | POA: Diagnosis not present

## 2023-10-14 DIAGNOSIS — I480 Paroxysmal atrial fibrillation: Secondary | ICD-10-CM | POA: Diagnosis not present

## 2023-10-15 ENCOUNTER — Other Ambulatory Visit: Payer: Self-pay

## 2023-10-15 ENCOUNTER — Other Ambulatory Visit: Payer: Self-pay | Admitting: Family Medicine

## 2023-10-15 NOTE — Telephone Encounter (Signed)
 Copied from CRM [id number]. Topic: Clinical - Medication Refill >> Oct 15, 2023  2:53 PM Hamp Levine R wrote: Most Recent Primary Care Visit:  Provider: NEWLIN, ENOBONG  Department: [facility]  Visit Type: OFFICE VISIT  Date: 09/09/2023  Medication: albuterol  (PROVENTIL ) (2.5 MG/3ML) 0.083% nebulizer solution  Has the patient contacted their pharmacy? Yes (Agent: If no, request that the patient contact the pharmacy for the refill. If patient does not wish to contact the pharmacy document the reason why and proceed with request.) (Agent: If yes, when and what did the pharmacy advise?)  Is this the correct pharmacy for this prescription? Yes If no, delete pharmacy and type the correct one.  This is the patient's preferred pharmacy:  [facility] [address] [place] [address] Phone: [phone] Fax: [phone]  Has the prescription been filled recently? No  Is the patient out of the medication? Yes  Has the patient been seen for an appointment in the last year OR does the patient have an upcoming appointment? Yes  Can we respond through MyChart? No Patient can be reached at [phone]  Agent: Please be advised that Rx refills may take up to 3 business days. We ask that you follow-up with your pharmacy.

## 2023-10-16 ENCOUNTER — Telehealth (INDEPENDENT_AMBULATORY_CARE_PROVIDER_SITE_OTHER): Payer: Self-pay | Admitting: Family Medicine

## 2023-10-16 DIAGNOSIS — I110 Hypertensive heart disease with heart failure: Secondary | ICD-10-CM | POA: Diagnosis not present

## 2023-10-16 DIAGNOSIS — I480 Paroxysmal atrial fibrillation: Secondary | ICD-10-CM | POA: Diagnosis not present

## 2023-10-16 DIAGNOSIS — E1151 Type 2 diabetes mellitus with diabetic peripheral angiopathy without gangrene: Secondary | ICD-10-CM | POA: Diagnosis not present

## 2023-10-16 DIAGNOSIS — J449 Chronic obstructive pulmonary disease, unspecified: Secondary | ICD-10-CM | POA: Diagnosis not present

## 2023-10-16 DIAGNOSIS — I5032 Chronic diastolic (congestive) heart failure: Secondary | ICD-10-CM | POA: Diagnosis not present

## 2023-10-16 DIAGNOSIS — D500 Iron deficiency anemia secondary to blood loss (chronic): Secondary | ICD-10-CM | POA: Diagnosis not present

## 2023-10-16 DIAGNOSIS — I2510 Atherosclerotic heart disease of native coronary artery without angina pectoris: Secondary | ICD-10-CM | POA: Diagnosis not present

## 2023-10-16 DIAGNOSIS — I5020 Unspecified systolic (congestive) heart failure: Secondary | ICD-10-CM | POA: Diagnosis not present

## 2023-10-16 DIAGNOSIS — I4892 Unspecified atrial flutter: Secondary | ICD-10-CM | POA: Diagnosis not present

## 2023-10-16 NOTE — Telephone Encounter (Signed)
 Copied from CRM [id number]. Topic: General - Other >> Oct 16, 2023 10:23 AM Emylou G wrote: Reason for CRM: [name] w/[employer] [id number] checking status of albuteral - patient is having trouble with his therapy because he doesn't have his inhaler. Patient number on file is good.. having to turn oxygen  up to 4 liters instead of 3

## 2023-10-16 NOTE — Telephone Encounter (Signed)
 Ok to send over Albuterol  inhaler.

## 2023-10-17 ENCOUNTER — Other Ambulatory Visit: Payer: Self-pay | Admitting: Internal Medicine

## 2023-10-17 ENCOUNTER — Other Ambulatory Visit: Payer: Self-pay

## 2023-10-17 MED ORDER — ALBUTEROL SULFATE (2.5 MG/3ML) 0.083% IN NEBU
2.5000 mg | INHALATION_SOLUTION | RESPIRATORY_TRACT | 1 refills | Status: AC | PRN
  Filled 2023-10-17: qty 75, 3d supply, fill #0
  Filled 2023-12-30: qty 75, 3d supply, fill #1

## 2023-10-17 NOTE — Telephone Encounter (Signed)
 Patient was called and he states that he needed the nebulizer solution instead, patient states that the medication was filled for him today.

## 2023-10-17 NOTE — Telephone Encounter (Signed)
 His chart reveals he has 6 refills at the pharmacy from 09/09/2023 he needs to call the pharmacy and pick it up.

## 2023-10-21 ENCOUNTER — Other Ambulatory Visit: Payer: Self-pay

## 2023-10-21 ENCOUNTER — Telehealth: Payer: Self-pay | Admitting: Pediatrics

## 2023-10-21 ENCOUNTER — Other Ambulatory Visit: Payer: Self-pay | Admitting: Nurse Practitioner

## 2023-10-21 ENCOUNTER — Telehealth: Payer: Self-pay

## 2023-10-21 ENCOUNTER — Inpatient Hospital Stay: Attending: Nurse Practitioner

## 2023-10-21 ENCOUNTER — Inpatient Hospital Stay (HOSPITAL_BASED_OUTPATIENT_CLINIC_OR_DEPARTMENT_OTHER): Admitting: Hematology

## 2023-10-21 VITALS — BP 94/46 | HR 111 | Temp 98.1°F | Resp 18 | Ht 75.0 in | Wt 195.2 lb

## 2023-10-21 DIAGNOSIS — F17210 Nicotine dependence, cigarettes, uncomplicated: Secondary | ICD-10-CM | POA: Diagnosis not present

## 2023-10-21 DIAGNOSIS — J449 Chronic obstructive pulmonary disease, unspecified: Secondary | ICD-10-CM | POA: Insufficient documentation

## 2023-10-21 DIAGNOSIS — Z9981 Dependence on supplemental oxygen: Secondary | ICD-10-CM | POA: Insufficient documentation

## 2023-10-21 DIAGNOSIS — D500 Iron deficiency anemia secondary to blood loss (chronic): Secondary | ICD-10-CM | POA: Insufficient documentation

## 2023-10-21 DIAGNOSIS — K274 Chronic or unspecified peptic ulcer, site unspecified, with hemorrhage: Secondary | ICD-10-CM | POA: Diagnosis not present

## 2023-10-21 LAB — IRON AND IRON BINDING CAPACITY (CC-WL,HP ONLY)
Iron: 19 ug/dL — ABNORMAL LOW (ref 45–182)
Saturation Ratios: 6 % — ABNORMAL LOW (ref 17.9–39.5)
TIBC: 335 ug/dL (ref 250–450)
UIBC: 316 ug/dL (ref 117–376)

## 2023-10-21 LAB — CBC WITH DIFFERENTIAL (CANCER CENTER ONLY)
Abs Immature Granulocytes: 0.03 10*3/uL (ref 0.00–0.07)
Basophils Absolute: 0 10*3/uL (ref 0.0–0.1)
Basophils Relative: 0 %
Eosinophils Absolute: 0.1 10*3/uL (ref 0.0–0.5)
Eosinophils Relative: 1 %
HCT: 27.8 % — ABNORMAL LOW (ref 39.0–52.0)
Hemoglobin: 7.6 g/dL — ABNORMAL LOW (ref 13.0–17.0)
Immature Granulocytes: 0 %
Lymphocytes Relative: 21 %
Lymphs Abs: 1.7 10*3/uL (ref 0.7–4.0)
MCH: 24 pg — ABNORMAL LOW (ref 26.0–34.0)
MCHC: 27.3 g/dL — ABNORMAL LOW (ref 30.0–36.0)
MCV: 87.7 fL (ref 80.0–100.0)
Monocytes Absolute: 0.6 10*3/uL (ref 0.1–1.0)
Monocytes Relative: 7 %
Neutro Abs: 5.5 10*3/uL (ref 1.7–7.7)
Neutrophils Relative %: 71 %
Platelet Count: 557 10*3/uL — ABNORMAL HIGH (ref 150–400)
RBC: 3.17 MIL/uL — ABNORMAL LOW (ref 4.22–5.81)
RDW: 21.2 % — ABNORMAL HIGH (ref 11.5–15.5)
WBC Count: 7.8 10*3/uL (ref 4.0–10.5)
nRBC: 0 % (ref 0.0–0.2)

## 2023-10-21 LAB — FERRITIN: Ferritin: 33 ng/mL (ref 24–336)

## 2023-10-21 NOTE — Assessment & Plan Note (Signed)
 secondary to h/o GI bleed, PUD, and inadequate iron  replacement -First found to have iron  deficiency 09/05/2018 with serum iron  9, 3% saturation, TIBC 358, and ferritin of 3 with a hemoglobin of 4.9, s/p blood transfusion  -EGD showed esophagitis, colonoscopy and small bowel endoscopy were unremarkable. Anemia improved -Developed acute on chronic anemia 10/2018, small bowel endoscopy  -Began receiving IV iron  at that time (Feraheme  510 mg x1 episodically), and been on iv venofer  lately, responded well  -Capsule endoscopy 06/18/19 showed gastritis, no AVMs or bleeding.  -continue iv iron  as needed, if ferrintin<30

## 2023-10-21 NOTE — Progress Notes (Signed)
 IV iron  orders placed for venofer  200 mg weekly for 6 weeks. Check CBC on week 4. On 7th week, change to every other week for 3 treatments. Orders sent to [facility].  -Rande Bushy, NP

## 2023-10-21 NOTE — Progress Notes (Signed)
 [facility]   [phone] [phone]   Clinic Follow up Note   Patient Care Team: Joaquin Mulberry, MD as PCP - General (Family Medicine) Elmyra Haggard, MD as PCP - Cardiology (Cardiology) [facility] Burton, Lacie K, NP as Nurse Practitioner (Hematology and Oncology) Raelyn Bulls, DPM as Consulting Physician (Podiatry)  Date of Service:  10/21/2023  CHIEF COMPLAINT: f/u of IDA  CURRENT THERAPY:  Iv iron  as needed if ferritin<30  Oncology History   Iron  deficiency anemia due to chronic blood loss secondary to h/o GI bleed, PUD, and inadequate iron  replacement -First found to have iron  deficiency 09/05/2018 with serum iron  9, 3% saturation, TIBC 358, and ferritin of 3 with a hemoglobin of 4.9, s/p blood transfusion  -EGD showed esophagitis, colonoscopy and small bowel endoscopy were unremarkable. Anemia improved -Developed acute on chronic anemia 10/2018, small bowel endoscopy  -Began receiving IV iron  at that time (Feraheme  510 mg x1 episodically), and been on iv venofer  lately, responded well  -Capsule endoscopy 06/18/19 showed gastritis, no AVMs or bleeding.  -continue iv iron  as needed, if ferrintin<30  Assessment & Plan Iron  deficiency anemia Iron  deficiency anemia with hemoglobin level of 7.6, indicating a decrease from previous levels. Likely due to insufficient iron  levels, necessitating frequent IV iron  supplementation. Reports intermittent melena, approximately twice a month, suggesting possible gastrointestinal bleeding. Previously received blood transfusions, last in March during hospitalization. - Administer IV iron  weekly for six weeks at the pulmonary office on [address]. - Perform blood tests at the pulmonary office to monitor hemoglobin levels. - Reassess in three months to evaluate the effectiveness of IV iron  treatment and determine if further intervention is needed. - Hold off on blood transfusion  pending response to IV iron  therapy.  Chronic obstructive pulmonary disease (COPD) and smoking cessation Chronic obstructive pulmonary disease with ongoing dyspnea. On 3 liters of oxygen  and continues to smoke, increasing risk for further lung function deterioration. Under care of pulmonologist at the pulmonary office on [address]. Smoking cessation strongly advised to prevent further decline. - Encourage smoking cessation to prevent further decline in lung function. - Continue follow-up with pulmonologist at the pulmonary office on [address].   Plan - Lab reviewed, will start IV Venofer  200 mg weekly x 6 then every 2 weeks this week at [facility] -lab in 4 and 8 weeks Lab and f/u in 3 months  -will hold on blood transfusion today - I strongly recommend him to stop smoking  History of Present Illness Jared [name] is a 68 year old male with iron  deficient anemia who presents for follow-up.  He experiences low energy levels affecting daily activities, requiring assistance for cooking and cleaning. He lives in a rooming house and prepares his own meals. He receives IV iron  treatments, with the last treatment two months ago. His hemoglobin level is 7.6, and he may need a blood transfusion. He has black stools approximately twice a month. He previously received a blood transfusion in March during hospitalization.  He has COPD and uses 3 liters of oxygen  therapy for four to five years. He continues to smoke and experiences shortness of breath with activity.     All other systems were reviewed with the patient and are negative.  MEDICAL HISTORY:  Past Medical History:  Diagnosis Date   Acute blood loss anemia 05/18/2023   Acute perforated appendicitis 04/30/2023   AKI (acute kidney injury) (HCC) 11/18/2020   Atrial fibrillation  with RVR (HCC) 05/06/2023   AVM (arteriovenous malformation)    CAD (coronary artery disease)    a. LHC 5/12:  LAD 20, pLCx 20, pRCA 40, dRCA  40, EF 35%, diff HK  //  b. Myoview 4/16: Overall Impression:  High risk stress nuclear study There is no evidence of ischemia.  There is severe LV dysfunction. LV Ejection Fraction: 30%.  LV Wall Motion:  There is global LV hypokinesis.     CAP (community acquired pneumonia) 09/2013   Chronic combined systolic and diastolic CHF (congestive heart failure) (HCC)    a. Echo 4/16:Mild LVH, EF 40-45%, diffuse HK //  b. Echo 8/17: EF 35-40%, diffuse HK, diastolic dysfunction, aortic sclerosis, trivial MR, moderate LAE, normal RVSF, moderate RAE, mild TR, PASP 42 mmHg // c. Echo 4/18: Mild concentric LVH, EF 30-35, normal wall motion, grade 1 diastolic dysfunction, PASP 49   Chronic respiratory failure (HCC)    Cluster headache    "hx; haven't had one in awhile" (01/09/2016)   COPD (chronic obstructive pulmonary disease) (HCC)    Maximo Spar 01/09/2016   DM (diabetes mellitus) (HCC)    History of CVA (cerebrovascular accident)    Hypertension    IDA (iron  deficiency anemia)    Moderate tobacco use disorder    NICM (nonischemic cardiomyopathy) (HCC)    Nicotine  addiction    Peripheral vascular disease (HCC)    Syncope 06/11/2019   Tobacco abuse    Type 2 diabetes mellitus (HCC) 05/14/2016    SURGICAL HISTORY: Past Surgical History:  Procedure Laterality Date   ABDOMINAL AORTOGRAM W/LOWER EXTREMITY N/A 12/07/2022   Procedure: ABDOMINAL AORTOGRAM W/LOWER EXTREMITY;  Surgeon: Carlene Che, MD;  [facility];  Service: Cardiovascular;  Laterality: N/A;   ABDOMINAL AORTOGRAM W/LOWER EXTREMITY Left 08/02/2023   Procedure: ABDOMINAL AORTOGRAM W/LOWER EXTREMITY;  Surgeon: Carlene Che, MD;  [facility]: [facility];  Service: Cardiovascular;  Laterality: Left;   BIOPSY  11/12/2020   Procedure: BIOPSY;  Surgeon: Normie Becton., MD;  Location: [facility] ENDOSCOPY;  Service: Gastroenterology;;   CARDIAC CATHETERIZATION  10/2010   LM normal, LAD with 20% irregularities, LCX with 20%,  RCA with 40% prox and 40% distal - EF of 35%   CATARACT EXTRACTION, BILATERAL     COLONOSCOPY W/ BIOPSIES AND POLYPECTOMY     COLONOSCOPY WITH PROPOFOL  N/A 09/06/2018   Procedure: COLONOSCOPY WITH PROPOFOL ;  Surgeon: Lindle Rhea, MD;  Location: [facility] ENDOSCOPY;  Service: Gastroenterology;  Laterality: N/A;   ENTEROSCOPY N/A 09/28/2018   Procedure: ENTEROSCOPY;  Surgeon: Alvis Jourdain, MD;  Location: [facility] ENDOSCOPY;  Service: Endoscopy;  Laterality: N/A;   ENTEROSCOPY N/A 10/28/2018   Procedure: ENTEROSCOPY;  Surgeon: Lindle Rhea, MD;  Location: [facility] ENDOSCOPY;  Service: Gastroenterology;  Laterality: N/A;   ENTEROSCOPY N/A 10/09/2020   Procedure: ENTEROSCOPY;  Surgeon: Tobin Forts, MD;  Location: [facility] ENDOSCOPY;  Service: Endoscopy;  Laterality: N/A;   ENTEROSCOPY N/A 03/22/2022   Procedure: ENTEROSCOPY;  Surgeon: Ace Holder, MD;  Location: [facility] ENDOSCOPY;  Service: Gastroenterology;  Laterality: N/A;   ENTEROSCOPY N/A 05/20/2023   Procedure: ENTEROSCOPY;  Surgeon: Albertina Hugger, MD;  Location: [facility] ENDOSCOPY;  Service: Gastroenterology;  Laterality: N/A;   ENTEROSCOPY N/A 08/24/2023   Procedure: ENTEROSCOPY;  Surgeon: Truddie Furrow, MD;  Location: [facility] ENDOSCOPY;  Service: Gastroenterology;  Laterality: N/A;   ESOPHAGOGASTRODUODENOSCOPY N/A 11/12/2020   Procedure: ESOPHAGOGASTRODUODENOSCOPY (EGD);  Surgeon: Normie Becton., MD;  Location: [facility] ENDOSCOPY;  Service: Gastroenterology;  Laterality: N/A;   ESOPHAGOGASTRODUODENOSCOPY (EGD) WITH PROPOFOL  N/A 09/05/2018   Procedure: ESOPHAGOGASTRODUODENOSCOPY (EGD) WITH PROPOFOL ;  Surgeon: Ace Holder, MD;  Location: [facility] ENDOSCOPY;  Service: Gastroenterology;  Laterality: N/A;   ESOPHAGOGASTRODUODENOSCOPY (EGD) WITH PROPOFOL  N/A 11/19/2020   Procedure: ESOPHAGOGASTRODUODENOSCOPY (EGD) WITH PROPOFOL ;  Surgeon: Nannette Babe, MD;  Location: [facility] ENDOSCOPY;  Service: Gastroenterology;  Laterality: N/A;   ESOPHAGOGASTRODUODENOSCOPY  (EGD) WITH PROPOFOL  N/A 12/27/2022   Procedure: ESOPHAGOGASTRODUODENOSCOPY (EGD) WITH PROPOFOL ;  Surgeon: Sergio Dandy, MD;  Location: [facility] ENDOSCOPY;  Service: Gastroenterology;  Laterality: N/A;   EXCISION MASS HEAD     GIVENS CAPSULE STUDY N/A 09/06/2018   Procedure: GIVENS CAPSULE STUDY;  Surgeon: Lindle Rhea, MD;  Location: [facility] ENDOSCOPY;  Service: Gastroenterology;  Laterality: N/A;   GIVENS CAPSULE STUDY N/A 09/26/2018   Procedure: GIVENS CAPSULE STUDY;  Surgeon: Nannette Babe, MD;  Location: [facility] ENDOSCOPY;  Service: Gastroenterology;  Laterality: N/A;   GIVENS CAPSULE STUDY N/A 06/14/2019   Procedure: GIVENS CAPSULE STUDY;  Surgeon: Sergio Dandy, MD;  Location[facility] ENDOS[facility];  Service: Endoscopy;  Laterality: N/A;   HEMOSTASIS CLIP PLACEMENT  11/12/2020   Procedure: HEMOSTASIS CLIP PLACEMENT;  Surgeon: Normie Becton., MD;  Location: [facility] ENDOSCOPY;  Service: Gastroenterology;;   HEMOSTASIS CONTROL  11/12/2020   Procedure: HEMOSTASIS CONTROL;  Surgeon: Normie Becton., MD;  Location: [facility] ENDOSCOPY;  Service: Gastroenterology;;   HOT HEMOSTASIS N/A 10/28/2018   Procedure: HOT HEMOSTASIS (ARGON PLASMA COAGULATION/BICAP);  Surgeon: Lindle Rhea, MD;  Location: [facility] ENDOSCOPY;  Service: Gastroenterology;  Laterality: N/A;   HOT HEMOSTASIS N/A 11/12/2020   Procedure: HOT HEMOSTASIS (ARGON PLASMA COAGULATION/BICAP);  Surgeon: Normie Becton., MD;  Location: [facility]OSCOPY;  Service: Gastroenterology;  Laterality: N/A;   HOT HEMOSTASIS N/A 03/22/2022   Procedure: HOT HEMOSTASIS (ARGON PLASMA COAGULATION/BICAP);  Surgeon: Ace Holder, MD;  Location: [facility] ENDOSCOPY;  Service: Gastroenterology;  Laterality: N/A;   HOT HEMOSTASIS N/A 05/20/2023   Procedure: HOT HEMOSTASIS (ARGON PLASMA COAGULATION/BICAP);  Surgeon: Albertina Hugger, MD;  Location: [facility] ENDOSCOPY;  Service: Gastroenterology;  Laterality: N/A;   HOT HEMOSTASIS N/A 08/24/2023   Procedure: EGD, WITH ARGON  PLASMA COAGULATION;  Surgeon: Truddie Furrow, MD;  Location: [facility] ENDOSCOPY;  Service: Gastroenterology;  Laterality: N/A;   INCISION AND DRAINAGE PERIRECTAL ABSCESS N/A 06/05/2017   Procedure: IRRIGATION AND DEBRIDEMENT PERIRECTAL ABSCESS;  Surgeon: Melvenia Stabs, MD;  Location: [facility] OR;  Service: General;  Laterality: N/A;   IR CATHETER TUBE CHANGE  05/14/2023   LAPAROSCOPIC APPENDECTOMY N/A 05/01/2023   Procedure: APPENDECTOMY LAPAROSCOPIC;  Surgeon: Enid Harry, MD;  Location: [facility] OR;  Service: General;  Laterality: N/A;   PERIPHERAL VASCULAR BALLOON ANGIOPLASTY Left 08/02/2023   Procedure: PERIPHERAL VASCULAR BALLOON ANGIOPLASTY;  Surgeon: Carlene Che, MD;  [facility];  Service: Cardiovascular;  Laterality: Left;   PERIPHERAL VASCULAR INTERVENTION  12/07/2022   Procedure: PERIPHERAL VASCULAR INTERVENTION;  Surgeon: Carlene Che, MD;  [facility];  Service: Cardiovascular;;   PERIPHERAL VASCULAR INTERVENTION Left 08/02/2023   Procedure: PERIPHERAL VASCULAR INTERVENTION;  Surgeon: Carlene Che, MD;  [facility];  Service: Cardiovascular;  Laterality: Left;   SUBMUCOSAL TATTOO INJECTION  11/12/2020   Procedure: SUBMUCOSAL TATTOO INJECTION;  Surgeon: Normie Becton., MD;  Location: [facility]OSCOPY;  Service: Gastroenterology;;   SUBMUCOSAL TATTOO INJECTION  05/20/2023   Procedure: SUBMUCOSAL TATTOO INJECTION;  Surgeon: Albertina Hugger, MD;  Location: [facility] ENDOSCOPY;  Service: Gastroenterology;;   VIDEO BRONCHOSCOPY Bilateral 05/08/2016   Procedure: VIDEO BRONCHOSCOPY WITH FLUORO;  Surgeon: Lind Repine, MD;  Location: [facility] ENDOSCOPY;  Service: Cardiopulmonary;  Laterality: Bilateral;    I have reviewed the social history and family history with the patient and they are unchanged from previous note.  ALLERGIES:  is allergic to lisinopril .  MEDICATIONS:  Current Outpatient Medications  Medication Sig Dispense Refill    acetaminophen  (TYLENOL ) 500 MG tablet Take 2 tablets (1,000 mg total) by mouth every 8 (eight) hours as needed for moderate pain. 100 tablet 1   albuterol  (PROVENTIL ) (2.5 MG/3ML) 0.083% nebulizer solution Take 3 mLs (2.5 mg total) by nebulization every 2 (two) hours as needed for wheezing or shortness of breath. 75 mL 1   albuterol  (VENTOLIN  HFA) 108 (90 Base) MCG/ACT inhaler INHALE 2 PUFFS INTO THE LUNGS EVERY 6 (SIX) HOURS AS NEEDED FOR WHEEZING OR SHORTNESS OF BREATH. 18 g 6   aspirin  EC 81 MG tablet Take 81 mg by mouth daily. Swallow whole.     atorvastatin  (LIPITOR ) 80 MG tablet Take 1 tablet (80 mg total) by mouth daily. 90 tablet 1   budeson-glycopyrrolate -formoterol  (BREZTRI  AEROSPHERE) 160-9-4.8 MCG/ACT AERO inhaler Take 2 puffs first thing in morning and then another 2 puffs about 12 hours later. (Patient taking differently: Inhale 2 puffs into the lungs in the morning and at bedtime.) 10.7 g 11   DULoxetine  (CYMBALTA ) 60 MG capsule Take 1 capsule (60 mg total) by mouth daily. For chronic leg pains 90 capsule 1   ergocalciferol  (DRISDOL ) 1.25 MG (50000 UT) capsule Take 1 capsule (50,000 Units total) by mouth once a week. 12 capsule 1   ezetimibe  (ZETIA ) 10 MG tablet Take 1 tablet (10 mg total) by mouth daily. 90 tablet 2   ferrous sulfate  (FEROSUL) 325 (65 FE) MG tablet Take 1 tablet (325 mg total) by mouth 2 (two) times daily with a meal. 180 tablet 1   folic acid  (FOLVITE ) 1 MG tablet Take 1 tablet (1 mg total) by mouth daily. 90 tablet 1   furosemide  (LASIX ) 40 MG tablet Take 1 tablet (40 mg total) by mouth 2 (two) times daily. 180 tablet 1   gabapentin  (NEURONTIN ) 300 MG capsule Take 1 capsule (300 mg total) by mouth 2 (two) times daily. 90 capsule 1   [Paused] hydrALAZINE  (APRESOLINE ) 50 MG tablet Take 1 tablet (50 mg total) by mouth every 8 (eight) hours. (Patient taking differently: Take 50 mg by mouth daily.) 270 tablet 1   [Paused] isosorbide  mononitrate (IMDUR ) 30 MG 24 hr tablet  Take 1 tablet (30 mg total) by mouth daily. 90 tablet 1   metoprolol  succinate (TOPROL -XL) 50 MG 24 hr tablet Take 1 tablet (50 mg total) by mouth daily. 90 tablet 1   Multiple Vitamin (MULTIVITAMIN WITH MINERALS) TABS tablet Take 1 tablet by mouth daily.     nitroGLYCERIN  (NITROSTAT ) 0.4 MG SL tablet Place 0.4 mg under the tongue every 5 (five) minutes as needed for chest pain.     OXYGEN  Inhale 2 L/min into the lungs continuous.     pantoprazole  (PROTONIX ) 40 MG tablet Take 1 tablet (40 mg total) by mouth daily. 90 tablet 1   rivaroxaban  (XARELTO ) 20 MG TABS tablet Take 1 tablet (20 mg total) by mouth daily with supper. 90 tablet 1   No current facility-administered medications for this visit.    PHYSICAL EXAMINATION: ECOG PERFORMANCE STATUS: 1 - Symptomatic but completely ambulatory  Vitals:   10/21/23 0934  BP: (!) 94/46  Pulse: (!) 111  Resp: 18  Temp: 98.1 F (36.7 C)  SpO2: 95%   Wt Readings from Last 3 Encounters:  10/21/23 195 lb 3.2 oz (88.5 kg)  09/09/23 196 lb 3.2 oz (89 kg)  08/24/23 200 lb (90.7 kg)     GENERAL:alert, no distress and comfortable SKIN: skin color, texture, turgor are normal, no rashes or significant lesions EYES: normal, Conjunctiva are pink and non-injected, sclera clear Musculoskeletal:no cyanosis of digits and no clubbing  NEURO: alert & oriented x 3 with fluent speech, no focal motor/sensory deficits  Physical Exam    LABORATORY DATA:  I have reviewed the data as listed    Latest Ref Rng & Units 10/21/2023    9:12 AM 09/12/2023    2:37 PM 08/26/2023    6:15 AM  CBC  WBC 4.0 - 10.5 K/uL 7.8  7.7  10.3   Hemoglobin 13.0 - 17.0 g/dL 7.6  7.7  8.8   Hematocrit 39.0 - 52.0 % 27.8  27.8  30.8   Platelets 150 - 400 K/uL 557  355  446         Latest Ref Rng & Units 08/26/2023    6:15 AM 08/25/2023    7:04 AM 08/24/2023    6:46 AM  CMP  Glucose 70 - 99 mg/dL 811  95  914   BUN 8 - 23 mg/dL 13  12  13    Creatinine 0.61 - 1.24 mg/dL 7.82   9.56  2.13   Sodium 135 - 145 mmol/L 140  143  139   Potassium 3.5 - 5.1 mmol/L 4.5  4.3  4.1   Chloride 98 - 111 mmol/L 101  102  99   CO2 22 - 32 mmol/L 35  31  34   Calcium  8.9 - 10.3 mg/dL 8.6  8.5  8.8       RADIOGRAPHIC STUDIES: I have personally reviewed the radiological images as listed and agreed with the findings in the report. No results found.    No orders of the defined types were placed in this encounter.  All questions were answered. The patient knows to call the clinic with any problems, questions or concerns. No barriers to learning was detected. The total time spent in the appointment was 25 minutes, including review of chart and various tests results, discussions about plan of care and coordination of care plan     Sonja Herrick, MD 10/21/2023

## 2023-10-21 NOTE — Telephone Encounter (Signed)
-----   Message from Jared [name] sent at 10/21/2023 12:02 PM EDT ----- Regarding: RE: Single balloon enteroscopy I spoke with [name] this morning and he is agreeable to proceeding with single balloon enteroscopy.  He had laboratory studies performed today his hemoglobin is 7.6 which is stable compared to 1 month ago.  Please contact him to schedule a date and time for the procedure.  Thanks,  Haskell Linker ----- Message ----- From: Jared Simons, RN Sent: 10/21/2023   9:54 AM EDT To: Jared Kinder, DO; Jared Furrow, MD Subject: RE: Single balloon enteroscopy                 Good morning, Will await confirmation from you Dr. Yvone Herd.  Thanks ----- Message ----- From: Jared Furrow, MD Sent: 10/20/2023  10:05 AM EDT To: Jared Kinder, DO; Jared Simons, RN Subject: RE: Single balloon enteroscopy                 Thanks, Bonnie Butters.  Jozalynn Noyce-let me touch base with the patient first to make sure he is comfortable undergoing the procedure.  I wanted to make sure Bonnie Butters had availability before offering this option to the patient.  Once I hear back from the patient I will give you the greenlight to reach out to him about setting up the procedure.  Thanks,  Haskell Linker ----- Message ----- From: Jared Kinder, DO Sent: 10/19/2023   7:26 AM EDT To: Jared Simons, RN; Jared Furrow, MD Subject: RE: Single balloon enteroscopy                 Benjie Brandy,  No problem, I am happy to help out with this and get him in for single balloon enteroscopy.   Yuri Flener, can you look to see when we can get this gentleman set up for single balloon with me at [facility] or [facility]?   Vito ----- Message ----- From: Jared Furrow, MD Sent: 10/15/2023   8:23 AM EDT To: Jared Kinder, DO Subject: Single balloon enteroscopy                     Hi Vito -  I am touching base regarding the possibility of scheduling a future, nonurgent elective single balloon enteroscopy for 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  I performed an SBE on him at [facility] this past winter and APC'ed AVMs in the duodenum/proximal jejunum.  He manifested persistent anemia and video capsule study demonstrated recurrent AVMs in the distal duodenum down to the mid small bowel.  If you are still doing single balloon enteroscopy I was going to speak with the patient and offer that as an option.  He is overall stable and I think just needs to stay on iron  infusions indefinitely.  Please let me know your thoughts when you have a chance-thanks for your input.  Haskell Linker

## 2023-10-21 NOTE — Telephone Encounter (Signed)
 I spoke on the telephone with Jared [name] to review the results of his video capsule endoscopy.  This showed 4 small nonbleeding AVMs in the duodenum, proximal and mid jejunum.  At least 2 of these lesions appear to be in the mid jejunum beyond the extent of a standard small bowel enteroscopy.    I discussed with Jared [name] referral to Dr. Karene Oto for single balloon enteroscopy and reviewed risks of anesthesia, aspiration, perforation and bleeding.  He is amenable to proceeding with single balloon enteroscopy and our office will contact him to schedule this in a hospital-based unit.  Labs were reviewed and show persistent anemia with hemoglobin 7.6 which remains low but stable compared to 1 month ago.  Advised that in the setting of having multiple small bowel angioectasias he will likely require chronic long-term IV iron  supplementation.  All questions answered.

## 2023-10-22 ENCOUNTER — Telehealth: Payer: Self-pay

## 2023-10-22 NOTE — Telephone Encounter (Addendum)
 Jared [name], patient will be scheduled as soon as possible.  Auth Submission: NO AUTH NEEDED Site of care: Site of care: [facility] Payer: [employer] medicare and [place] [employer] Medication & CPT/J Code(s) submitted: Venofer  (Iron  Sucrose) J1756 Route of submission (phone, fax, portal):  Phone # Fax # Auth type: Buy/Bill PB Units/visits requested: 200mg  x 9 doses Reference number:  Approval from: 10/22/23 to 03/23/24

## 2023-10-23 DIAGNOSIS — I2510 Atherosclerotic heart disease of native coronary artery without angina pectoris: Secondary | ICD-10-CM | POA: Diagnosis not present

## 2023-10-23 DIAGNOSIS — I5020 Unspecified systolic (congestive) heart failure: Secondary | ICD-10-CM | POA: Diagnosis not present

## 2023-10-23 DIAGNOSIS — I110 Hypertensive heart disease with heart failure: Secondary | ICD-10-CM | POA: Diagnosis not present

## 2023-10-23 DIAGNOSIS — I5032 Chronic diastolic (congestive) heart failure: Secondary | ICD-10-CM | POA: Diagnosis not present

## 2023-10-23 DIAGNOSIS — D500 Iron deficiency anemia secondary to blood loss (chronic): Secondary | ICD-10-CM | POA: Diagnosis not present

## 2023-10-23 DIAGNOSIS — J449 Chronic obstructive pulmonary disease, unspecified: Secondary | ICD-10-CM | POA: Diagnosis not present

## 2023-10-23 DIAGNOSIS — I480 Paroxysmal atrial fibrillation: Secondary | ICD-10-CM | POA: Diagnosis not present

## 2023-10-23 DIAGNOSIS — I4892 Unspecified atrial flutter: Secondary | ICD-10-CM | POA: Diagnosis not present

## 2023-10-23 DIAGNOSIS — E1151 Type 2 diabetes mellitus with diabetic peripheral angiopathy without gangrene: Secondary | ICD-10-CM | POA: Diagnosis not present

## 2023-10-24 ENCOUNTER — Telehealth: Payer: Self-pay

## 2023-10-24 ENCOUNTER — Other Ambulatory Visit: Payer: Self-pay

## 2023-10-24 DIAGNOSIS — Z7902 Long term (current) use of antithrombotics/antiplatelets: Secondary | ICD-10-CM

## 2023-10-24 DIAGNOSIS — D500 Iron deficiency anemia secondary to blood loss (chronic): Secondary | ICD-10-CM

## 2023-10-24 DIAGNOSIS — K31819 Angiodysplasia of stomach and duodenum without bleeding: Secondary | ICD-10-CM

## 2023-10-24 DIAGNOSIS — K5520 Angiodysplasia of colon without hemorrhage: Secondary | ICD-10-CM

## 2023-10-24 NOTE — Telephone Encounter (Signed)
 Called and spoke with patient. We have scheduled single balloon enteroscopy on Tuesday, 11/19/23 at 10:05 am, arriving at 8:30 am. Patient has access to MyChart and knows to expect his instructions there and a copy will be mailed to him. Patient verbalized understanding and had no concerns at the end of the call.   Cardiac clearance requested to hold Xarelto  for procedure.   EGD instructions will be sent once Xarelto  hold is approved.

## 2023-10-24 NOTE — Telephone Encounter (Signed)
 Jared [name] 07-26-1955 [id number]   Dear Dr. Newlin:  We have scheduled the above named patient for a single balloon enteroscopy procedure. Our records show that he is on anticoagulation therapy.  Please advise as to whether the patient may come off their therapy of Xarelto  2 days prior to their procedure which is scheduled for Tuesday, 11/19/23.  Please route your response to Edra Govern, RN or fax response to [phone].  Sincerely,   [facility]

## 2023-10-25 ENCOUNTER — Ambulatory Visit

## 2023-10-25 VITALS — BP 110/63 | HR 95 | Temp 98.8°F | Resp 20 | Ht 75.0 in | Wt 193.6 lb

## 2023-10-25 DIAGNOSIS — D500 Iron deficiency anemia secondary to blood loss (chronic): Secondary | ICD-10-CM

## 2023-10-25 DIAGNOSIS — E1151 Type 2 diabetes mellitus with diabetic peripheral angiopathy without gangrene: Secondary | ICD-10-CM | POA: Diagnosis not present

## 2023-10-25 DIAGNOSIS — D509 Iron deficiency anemia, unspecified: Secondary | ICD-10-CM

## 2023-10-25 DIAGNOSIS — I480 Paroxysmal atrial fibrillation: Secondary | ICD-10-CM | POA: Diagnosis not present

## 2023-10-25 DIAGNOSIS — I5020 Unspecified systolic (congestive) heart failure: Secondary | ICD-10-CM | POA: Diagnosis not present

## 2023-10-25 DIAGNOSIS — J449 Chronic obstructive pulmonary disease, unspecified: Secondary | ICD-10-CM | POA: Diagnosis not present

## 2023-10-25 DIAGNOSIS — I2510 Atherosclerotic heart disease of native coronary artery without angina pectoris: Secondary | ICD-10-CM | POA: Diagnosis not present

## 2023-10-25 DIAGNOSIS — I110 Hypertensive heart disease with heart failure: Secondary | ICD-10-CM | POA: Diagnosis not present

## 2023-10-25 DIAGNOSIS — I4892 Unspecified atrial flutter: Secondary | ICD-10-CM | POA: Diagnosis not present

## 2023-10-25 DIAGNOSIS — I5032 Chronic diastolic (congestive) heart failure: Secondary | ICD-10-CM | POA: Diagnosis not present

## 2023-10-25 MED ORDER — IRON SUCROSE 20 MG/ML IV SOLN
200.0000 mg | Freq: Once | INTRAVENOUS | Status: AC
  Administered 2023-10-25: 200 mg via INTRAVENOUS
  Filled 2023-10-25: qty 10

## 2023-10-25 MED ORDER — DIPHENHYDRAMINE HCL 25 MG PO CAPS: 25.0000 mg | ORAL_CAPSULE | Freq: Once | ORAL | Status: DC

## 2023-10-25 MED ORDER — ACETAMINOPHEN 325 MG PO TABS: 650.0000 mg | ORAL_TABLET | Freq: Once | ORAL | Status: DC

## 2023-10-25 NOTE — Telephone Encounter (Signed)
 Yes he may come off Xarelto  2 days prior to his procedure.

## 2023-10-25 NOTE — Progress Notes (Signed)
 Diagnosis: Iron  Deficiency Anemia  Provider:  Praveen Mannam MD  Procedure: IV Push  IV Type: Peripheral, IV Location: L Hand  Venofer  (Iron  Sucrose), Dose: 200 mg  Post Infusion IV Care: Patient declined observation and Peripheral IV Discontinued  Discharge: Condition: Good, Destination: Home . AVS Declined  Performed by:  Lauran Pollard, LPN    Patient stated that they already took tylenol  1000 mg at home at least 30 minutes prior to their infusion appointment.   Patient refused benadryl . Nurse educated patient and stressed the importance of taking benadryl  as a precaution in the event of a medication reaction. Patient verbalized understanding.

## 2023-10-28 NOTE — Telephone Encounter (Signed)
 Clearance obtained to hold Xarelto  2 days prior to enteroscopy. Instructions mailed and sent to patient via MyChart.

## 2023-10-28 NOTE — Telephone Encounter (Signed)
 Noted

## 2023-10-29 ENCOUNTER — Encounter: Payer: Self-pay | Admitting: Pediatrics

## 2023-11-01 ENCOUNTER — Ambulatory Visit: Admitting: *Deleted

## 2023-11-01 VITALS — BP 149/66 | HR 89 | Temp 97.8°F | Resp 20 | Ht 75.0 in | Wt 198.4 lb

## 2023-11-01 DIAGNOSIS — D500 Iron deficiency anemia secondary to blood loss (chronic): Secondary | ICD-10-CM

## 2023-11-01 MED ORDER — IRON SUCROSE 20 MG/ML IV SOLN
200.0000 mg | Freq: Once | INTRAVENOUS | Status: DC
  Filled 2023-11-01: qty 10

## 2023-11-01 MED ORDER — DIPHENHYDRAMINE HCL 25 MG PO CAPS
25.0000 mg | ORAL_CAPSULE | Freq: Once | ORAL | Status: DC
Start: 2023-11-01 — End: 2023-11-01

## 2023-11-01 MED ORDER — ACETAMINOPHEN 325 MG PO TABS: 650.0000 mg | ORAL_TABLET | Freq: Once | ORAL | Status: DC

## 2023-11-01 NOTE — Progress Notes (Signed)
 Patient decided to come back for his next visit  on Friday 5/30 after 4 attempts unsuccessful  to start IV site   Lovett Ruck,, RN

## 2023-11-08 ENCOUNTER — Ambulatory Visit

## 2023-11-08 VITALS — BP 123/70 | HR 104 | Temp 98.0°F | Resp 20 | Ht 75.0 in | Wt 200.4 lb

## 2023-11-08 DIAGNOSIS — D509 Iron deficiency anemia, unspecified: Secondary | ICD-10-CM

## 2023-11-08 DIAGNOSIS — D500 Iron deficiency anemia secondary to blood loss (chronic): Secondary | ICD-10-CM

## 2023-11-08 MED ORDER — IRON SUCROSE 20 MG/ML IV SOLN
200.0000 mg | Freq: Once | INTRAVENOUS | Status: AC
  Administered 2023-11-08: 200 mg via INTRAVENOUS
  Filled 2023-11-08: qty 10

## 2023-11-08 MED ORDER — ACETAMINOPHEN 325 MG PO TABS: 650.0000 mg | ORAL_TABLET | Freq: Once | ORAL | Status: DC

## 2023-11-08 MED ORDER — DIPHENHYDRAMINE HCL 25 MG PO CAPS: 25.0000 mg | ORAL_CAPSULE | Freq: Once | ORAL | Status: DC

## 2023-11-08 NOTE — Progress Notes (Signed)
 lDiagnosis: Iron  Deficiency Anemia  Provider:  Praveen Mannam MD  Procedure: IV Push  IV Type: Peripheral, IV Location: L Hand  Venofer  (Iron  Sucrose), Dose: 200 mg  Post Infusion IV Care: Patient declined observation and Peripheral IV Discontinued Patient refused pre-medications. Nurse educated patient and stressed the importance of taking pre-medications as a precaution in the event of a medication reaction. Patient verbalized understanding.  Discharge: Condition: Good, Destination: Home . AVS Declined  Performed by:  Su Duma, RN

## 2023-11-12 ENCOUNTER — Telehealth: Payer: Self-pay

## 2023-11-12 ENCOUNTER — Encounter (HOSPITAL_COMMUNITY): Payer: Self-pay | Admitting: Gastroenterology

## 2023-11-12 ENCOUNTER — Inpatient Hospital Stay: Payer: Medicare HMO | Attending: Nurse Practitioner

## 2023-11-12 ENCOUNTER — Other Ambulatory Visit: Payer: Self-pay

## 2023-11-12 DIAGNOSIS — K274 Chronic or unspecified peptic ulcer, site unspecified, with hemorrhage: Secondary | ICD-10-CM | POA: Diagnosis not present

## 2023-11-12 DIAGNOSIS — D500 Iron deficiency anemia secondary to blood loss (chronic): Secondary | ICD-10-CM | POA: Insufficient documentation

## 2023-11-12 LAB — CBC WITH DIFFERENTIAL (CANCER CENTER ONLY)
Abs Immature Granulocytes: 0.02 10*3/uL (ref 0.00–0.07)
Basophils Absolute: 0 10*3/uL (ref 0.0–0.1)
Basophils Relative: 0 %
Eosinophils Absolute: 0.1 10*3/uL (ref 0.0–0.5)
Eosinophils Relative: 1 %
HCT: 23.9 % — ABNORMAL LOW (ref 39.0–52.0)
Hemoglobin: 6.4 g/dL — CL (ref 13.0–17.0)
Immature Granulocytes: 0 %
Lymphocytes Relative: 23 %
Lymphs Abs: 1.5 10*3/uL (ref 0.7–4.0)
MCH: 24.5 pg — ABNORMAL LOW (ref 26.0–34.0)
MCHC: 26.8 g/dL — ABNORMAL LOW (ref 30.0–36.0)
MCV: 91.6 fL (ref 80.0–100.0)
Monocytes Absolute: 0.7 10*3/uL (ref 0.1–1.0)
Monocytes Relative: 10 %
Neutro Abs: 4.3 10*3/uL (ref 1.7–7.7)
Neutrophils Relative %: 66 %
Platelet Count: 316 10*3/uL (ref 150–400)
RBC: 2.61 MIL/uL — ABNORMAL LOW (ref 4.22–5.81)
RDW: 22.9 % — ABNORMAL HIGH (ref 11.5–15.5)
WBC Count: 6.6 10*3/uL (ref 4.0–10.5)
nRBC: 0.9 % — ABNORMAL HIGH (ref 0.0–0.2)

## 2023-11-12 LAB — IRON AND IRON BINDING CAPACITY (CC-WL,HP ONLY)
Iron: 19 ug/dL — ABNORMAL LOW (ref 45–182)
Saturation Ratios: 6 % — ABNORMAL LOW (ref 17.9–39.5)
TIBC: 319 ug/dL (ref 250–450)
UIBC: 300 ug/dL (ref 117–376)

## 2023-11-12 LAB — FERRITIN: Ferritin: 44 ng/mL (ref 24–336)

## 2023-11-12 NOTE — Telephone Encounter (Signed)
 Critical lab value reported:  Hbg 6.4 No Blood Bank Hold done  Notified Delvin File, NP, Dr. Maryalice Smaller, and Rande Bushy, NP.

## 2023-11-13 ENCOUNTER — Other Ambulatory Visit: Payer: Self-pay | Admitting: Family Medicine

## 2023-11-13 ENCOUNTER — Emergency Department (HOSPITAL_COMMUNITY)

## 2023-11-13 ENCOUNTER — Inpatient Hospital Stay

## 2023-11-13 ENCOUNTER — Encounter (HOSPITAL_COMMUNITY): Payer: Self-pay

## 2023-11-13 ENCOUNTER — Emergency Department (HOSPITAL_COMMUNITY)
Admission: EM | Admit: 2023-11-13 | Discharge: 2023-11-13 | Disposition: A | Discharge status: Home / Self Care | Attending: Emergency Medicine | Admitting: Emergency Medicine

## 2023-11-13 ENCOUNTER — Other Ambulatory Visit: Payer: Self-pay

## 2023-11-13 DIAGNOSIS — Z7982 Long term (current) use of aspirin: Secondary | ICD-10-CM | POA: Insufficient documentation

## 2023-11-13 DIAGNOSIS — Z7951 Long term (current) use of inhaled steroids: Secondary | ICD-10-CM | POA: Insufficient documentation

## 2023-11-13 DIAGNOSIS — D649 Anemia, unspecified: Secondary | ICD-10-CM

## 2023-11-13 DIAGNOSIS — J449 Chronic obstructive pulmonary disease, unspecified: Secondary | ICD-10-CM | POA: Insufficient documentation

## 2023-11-13 DIAGNOSIS — R7989 Other specified abnormal findings of blood chemistry: Secondary | ICD-10-CM | POA: Insufficient documentation

## 2023-11-13 DIAGNOSIS — I700 Atherosclerosis of aorta: Secondary | ICD-10-CM | POA: Diagnosis not present

## 2023-11-13 DIAGNOSIS — D500 Iron deficiency anemia secondary to blood loss (chronic): Secondary | ICD-10-CM

## 2023-11-13 DIAGNOSIS — R0602 Shortness of breath: Secondary | ICD-10-CM | POA: Insufficient documentation

## 2023-11-13 LAB — BASIC METABOLIC PANEL WITH GFR
Anion gap: 9 (ref 5–15)
BUN: 12 mg/dL (ref 8–23)
CO2: 35 mmol/L — ABNORMAL HIGH (ref 22–32)
Calcium: 8.5 mg/dL — ABNORMAL LOW (ref 8.9–10.3)
Chloride: 96 mmol/L — ABNORMAL LOW (ref 98–111)
Creatinine, Ser: 0.85 mg/dL (ref 0.61–1.24)
GFR, Estimated: >60 mL/min (ref >60)
Glucose, Bld: 191 mg/dL — ABNORMAL HIGH (ref 70–99)
Potassium: 3.5 mmol/L (ref 3.5–5.1)
Sodium: 140 mmol/L (ref 135–145)

## 2023-11-13 LAB — CBC
HCT: 23.6 % — ABNORMAL LOW (ref 39.0–52.0)
Hemoglobin: 6.1 g/dL — CL (ref 13.0–17.0)
MCH: 24.7 pg — ABNORMAL LOW (ref 26.0–34.0)
MCHC: 25.8 g/dL — ABNORMAL LOW (ref 30.0–36.0)
MCV: 95.5 fL (ref 80.0–100.0)
Platelets: 332 10*3/uL (ref 150–400)
RBC: 2.47 MIL/uL — ABNORMAL LOW (ref 4.22–5.81)
RDW: 22.6 % — ABNORMAL HIGH (ref 11.5–15.5)
WBC: 5.9 10*3/uL (ref 4.0–10.5)
nRBC: 1.2 % — ABNORMAL HIGH (ref 0.0–0.2)

## 2023-11-13 LAB — PREPARE RBC (CROSSMATCH)

## 2023-11-13 LAB — TROPONIN I (HIGH SENSITIVITY): Troponin I (High Sensitivity): 20 ng/L — ABNORMAL HIGH (ref <18)

## 2023-11-13 LAB — BRAIN NATRIURETIC PEPTIDE: B Natriuretic Peptide: 159.3 pg/mL — ABNORMAL HIGH (ref 0.0–100.0)

## 2023-11-13 MED ORDER — IPRATROPIUM-ALBUTEROL 0.5-2.5 (3) MG/3ML IN SOLN
3.0000 mL | RESPIRATORY_TRACT | Status: DC
  Administered 2023-11-13: 3 mL via RESPIRATORY_TRACT
  Filled 2023-11-13: qty 3

## 2023-11-13 MED ORDER — METHYLPREDNISOLONE SODIUM SUCC 125 MG IJ SOLR
125.0000 mg | Freq: Once | INTRAMUSCULAR | Status: AC
  Administered 2023-11-13: 125 mg via INTRAVENOUS
  Filled 2023-11-13: qty 2

## 2023-11-13 MED ORDER — MAGNESIUM SULFATE 2 GM/50ML IV SOLN
2.0000 g | Freq: Once | INTRAVENOUS | Status: AC
  Administered 2023-11-13: 2 g via INTRAVENOUS
  Filled 2023-11-13: qty 50

## 2023-11-13 MED ORDER — ERGOCALCIFEROL 1.25 MG (50000 UT) PO CAPS
50000.0000 [IU] | ORAL_CAPSULE | ORAL | 1 refills | Status: AC
  Filled 2023-11-13 – 2023-12-30 (×2): qty 12, 84d supply, fill #0
  Filled 2024-04-02: qty 12, 84d supply, fill #1

## 2023-11-13 MED ORDER — IPRATROPIUM-ALBUTEROL 0.5-2.5 (3) MG/3ML IN SOLN
3.0000 mL | RESPIRATORY_TRACT | Status: AC
  Administered 2023-11-13 (×2): 3 mL via RESPIRATORY_TRACT
  Filled 2023-11-13: qty 6

## 2023-11-13 MED ORDER — SODIUM CHLORIDE 0.9% IV SOLUTION: Freq: Once | INTRAVENOUS | Status: AC

## 2023-11-13 NOTE — ED Notes (Signed)
 2nd T&S sent to blood bank

## 2023-11-13 NOTE — ED Triage Notes (Signed)
 Pt arrived from cancer center reporting feeling increasingly weak/shob. Reporting needing a blood transfusion and he has it scheduled in a few days but couldn't wait. Denies any known bleeding at this time. Patient wears 2L [place] at home- COPD HX

## 2023-11-13 NOTE — ED Provider Notes (Signed)
 [place] EMERGENCY DEPARTMENT AT [facility] Provider Note   CSN: [id number] Arrival date & time: 11/13/23  1548     History  Chief Complaint  Patient presents with   Anemia    Jared [name] is a 68 y.o. male.  68 yo M with with a chief complaints of shortness of breath and fatigue.  This been going on for a few days.  He was seen by his oncologist and was found to have a hemoglobin of 6.1.  Patient has been struggling with a low hemoglobin for some time.  Has required transfusion occasionally.  He was told by his clinic to come back and get blood rechecked today and then they would schedule him for 2 unit transfusion on Friday through the infusion center if his blood level was still low.  He decided due to him feeling fatigued he went to get blood right away and so came to the emergency department for evaluation.   Anemia       Home Medications Prior to Admission medications   Medication Sig Start Date End Date Taking? Authorizing Provider  acetaminophen  (TYLENOL ) 500 MG tablet Take 2 tablets (1,000 mg total) by mouth every 8 (eight) hours as needed for moderate pain. 01/03/23   Newlin, Enobong, MD  albuterol  (PROVENTIL ) (2.5 MG/3ML) 0.083% nebulizer solution Take 3 mLs (2.5 mg total) by nebulization every 2 (two) hours as needed for wheezing or shortness of breath. 10/17/23   Diamond Formica, MD  albuterol  (VENTOLIN  HFA) 108 (90 Base) MCG/ACT inhaler INHALE 2 PUFFS INTO THE LUNGS EVERY 6 (SIX) HOURS AS NEEDED FOR WHEEZING OR SHORTNESS OF BREATH. 09/09/23   Newlin, Enobong, MD  aspirin  EC 81 MG tablet Take 81 mg by mouth daily. Swallow whole.    [provider]  atorvastatin  (LIPITOR ) 80 MG tablet Take 1 tablet (80 mg total) by mouth daily. 09/09/23   Newlin, Enobong, MD  budesonide -glycopyrrolate -formoterol  (BREZTRI  AEROSPHERE) 160-9-4.8 MCG/ACT AERO inhaler Take 2 puffs first thing in morning and then another 2 puffs about 12 hours later. Patient taking  differently: Inhale 2 puffs into the lungs in the morning and at bedtime. 08/15/23   Diamond Formica, MD  DULoxetine  (CYMBALTA ) 60 MG capsule Take 1 capsule (60 mg total) by mouth daily. For chronic leg pains 09/09/23   Newlin, Enobong, MD  ergocalciferol  (DRISDOL ) 1.25 MG (50000 UT) capsule Take 1 capsule (50,000 Units total) by mouth once a week. 11/13/23   Newlin, Enobong, MD  ezetimibe  (ZETIA ) 10 MG tablet Take 1 tablet (10 mg total) by mouth daily. 06/06/23   Elmyra Haggard, MD  ferrous sulfate  (FEROSUL) 325 (65 FE) MG tablet Take 1 tablet (325 mg total) by mouth 2 (two) times daily with a meal. 09/09/23   Joaquin Mulberry, MD  folic acid  (FOLVITE ) 1 MG tablet Take 1 tablet (1 mg total) by mouth daily. 09/09/23   Newlin, Enobong, MD  furosemide  (LASIX ) 40 MG tablet Take 1 tablet (40 mg total) by mouth 2 (two) times daily. 09/09/23   Newlin, Enobong, MD  gabapentin  (NEURONTIN ) 300 MG capsule Take 1 capsule (300 mg total) by mouth 2 (two) times daily. 09/09/23   Newlin, Enobong, MD  hydrALAZINE  (APRESOLINE ) 50 MG tablet Take 1 tablet (50 mg total) by mouth every 8 (eight) hours. Patient taking differently: Take 50 mg by mouth daily. 02/04/23   Newlin, Enobong, MD  isosorbide  mononitrate (IMDUR ) 30 MG 24 hr tablet Take 1 tablet (30 mg total) by mouth daily. 06/10/23  Newlin, Enobong, MD  metoprolol  succinate (TOPROL -XL) 50 MG 24 hr tablet Take 1 tablet (50 mg total) by mouth daily. 09/09/23   Newlin, Enobong, MD  Multiple Vitamin (MULTIVITAMIN WITH MINERALS) TABS tablet Take 1 tablet by mouth daily. 05/22/23   Ghimire, Estil Heman, MD  nitroGLYCERIN  (NITROSTAT ) 0.4 MG SL tablet Place 0.4 mg under the tongue every 5 (five) minutes as needed for chest pain.    [provider]  OXYGEN  Inhale 2 L/min into the lungs continuous.    [provider]  pantoprazole  (PROTONIX ) 40 MG tablet Take 1 tablet (40 mg total) by mouth daily. 09/09/23   Newlin, Enobong, MD  rivaroxaban  (XARELTO ) 20 MG TABS tablet  Take 1 tablet (20 mg total) by mouth daily with supper. 09/09/23   Newlin, Enobong, MD  TUDORZA PRESSAIR  400 MCG/ACT AEPB INHALE 1 PUFF INTO THE LUNGS IN THE MORNING AND AT BEDTIME. 03/29/20 04/04/20  Diamond Formica, MD      Allergies    Lisinopril     Review of Systems   Review of Systems  Physical Exam Updated Vital Signs BP 121/69 (BP Location: Right Arm)   Pulse 86   Temp 97.8 F (36.6 C) (Oral)   Resp 20   SpO2 100%  Physical Exam Vitals and nursing note reviewed.  Constitutional:      Appearance: He is well-developed.  HENT:     Head: Normocephalic and atraumatic.  Eyes:     Pupils: Pupils are equal, round, and reactive to light.  Neck:     Vascular: No JVD.  Cardiovascular:     Rate and Rhythm: Normal rate and regular rhythm.     Heart sounds: No murmur heard.    No friction rub. No gallop.  Pulmonary:     Effort: No respiratory distress.     Breath sounds: No wheezing.     Comments: Diminished breath sounds in all fields Abdominal:     General: There is no distension.     Tenderness: There is no abdominal tenderness. There is no guarding or rebound.  Musculoskeletal:        General: Normal range of motion.     Cervical back: Normal range of motion and neck supple.  Skin:    Coloration: Skin is not pale.     Findings: No rash.  Neurological:     Mental Status: He is alert and oriented to person, place, and time.  Psychiatric:        Behavior: Behavior normal.     ED Results / Procedures / Treatments   Labs (all labs ordered are listed, but only abnormal results are displayed) Labs Reviewed  CBC - Abnormal; Notable for the following components:      Result Value   RBC 2.47 (*)    Hemoglobin 6.1 (*)    HCT 23.6 (*)    MCH 24.7 (*)    MCHC 25.8 (*)    RDW 22.6 (*)    nRBC 1.2 (*)    All other components within normal limits  BASIC METABOLIC PANEL WITH GFR - Abnormal; Notable for the following components:   Chloride 96 (*)    CO2 35 (*)     Glucose, Bld 191 (*)    Calcium  8.5 (*)    All other components within normal limits  BRAIN NATRIURETIC PEPTIDE - Abnormal; Notable for the following components:   B Natriuretic Peptide 159.3 (*)    All other components within normal limits  TROPONIN I (HIGH SENSITIVITY) -  Abnormal; Notable for the following components:   Troponin I (High Sensitivity) 20 (*)    All other components within normal limits  TYPE AND SCREEN  PREPARE RBC (CROSSMATCH)    EKG None  Radiology DG Chest Port 1 View Result Date: 11/13/2023 CLINICAL DATA:  sob EXAM: PORTABLE CHEST - 1 VIEW COMPARISON:  August 22, 2023 FINDINGS: No focal airspace consolidation, pleural effusion, or pneumothorax. No cardiomegaly. Aortic atherosclerosis. No acute fracture or destructive lesion. IMPRESSION: No acute cardiopulmonary abnormality. Electronically Signed   By: Rance Burrows M.D.   On: 11/13/2023 16:49    Procedures .Critical Care  Performed by: Albertus Hughs, DO Authorized by: Albertus Hughs, DO   Critical care provider statement:    Critical care time (minutes):  35   Critical care time was exclusive of:  Separately billable procedures and treating other patients   Critical care was time spent personally by me on the following activities:  Development of treatment plan with patient or surrogate, discussions with consultants, evaluation of patient's response to treatment, examination of patient, ordering and review of laboratory studies, ordering and review of radiographic studies, ordering and performing treatments and interventions, pulse oximetry, re-evaluation of patient's condition and review of old charts   Care discussed with: admitting provider       Medications Ordered in ED Medications  methylPREDNISolone  sodium succinate (SOLU-MEDROL ) 125 mg/2 mL injection 125 mg (125 mg Intravenous Given 11/13/23 1652)  magnesium  sulfate IVPB 2 g 50 mL (0 g Intravenous Stopped 11/13/23 1711)  ipratropium-albuterol  (DUONEB) 0.5-2.5  (3) MG/3ML nebulizer solution 3 mL (3 mLs Nebulization Given 11/13/23 1759)  0.9 %  sodium chloride  infusion (Manually program via Guardrails IV Fluids) (0 mLs Intravenous Stopped 11/13/23 2257)    ED Course/ Medical Decision Making/ A&P                                 Medical Decision Making Amount and/or Complexity of Data Reviewed Labs: ordered. Radiology: ordered.  Risk Prescription drug management.   68 yo M with a chief complaints of fatigue shortness of breath going on for about a week or so.  He was seen by his oncologist and there were plans to get a blood transfusion on Friday but due to how the patient was feeling he decided to come today to get a blood transfusion more emergently.  Hemoglobin rechecked here is 6.1.  Will give a unit of blood.  Troponin trivially elevated.  BNP mildly elevated.  Chest x-ray independently interpreted by me without focal infiltrate or pneumothorax.  Will discharge home.  Oncology follow-up.  11:00 PM:  I have discussed the diagnosis/risks/treatment options with the patient and family.  Evaluation and diagnostic testing in the emergency department does not suggest an emergent condition requiring admission or immediate intervention beyond what has been performed at this time.  They will follow up with Oncology. We also discussed returning to the ED immediately if new or worsening sx occur. We discussed the sx which are most concerning (e.g., sudden worsening pain, fever, inability to tolerate by mouth, worsening sob) that necessitate immediate return. Medications administered to the patient during their visit and any new prescriptions provided to the patient are listed below.  Medications given during this visit Medications  methylPREDNISolone  sodium succinate (SOLU-MEDROL ) 125 mg/2 mL injection 125 mg (125 mg Intravenous Given 11/13/23 1652)  magnesium  sulfate IVPB 2 g 50 mL (0 g Intravenous Stopped 11/13/23 1711)  ipratropium-albuterol  (DUONEB)  0.5-2.5 (3) MG/3ML nebulizer solution 3 mL (3 mLs Nebulization Given 11/13/23 1759)  0.9 %  sodium chloride  infusion (Manually program via Guardrails IV Fluids) (0 mLs Intravenous Stopped 11/13/23 2257)     The patient appears reasonably screen and/or stabilized for discharge and I doubt any other medical condition or other [facility] requiring further screening, evaluation, or treatment in the ED at this time prior to discharge.          Final Clinical Impression(s) / ED Diagnoses Final diagnoses:  Symptomatic anemia    Rx / DC Orders ED Discharge Orders     None         Albertus Hughs, DO 11/13/23 2300

## 2023-11-13 NOTE — ED Notes (Signed)
 Call from blood bank stating will call when blood is ready

## 2023-11-13 NOTE — Discharge Instructions (Signed)
 Follow up with your oncologist in the office.

## 2023-11-14 ENCOUNTER — Telehealth: Payer: Self-pay

## 2023-11-14 LAB — TYPE AND SCREEN
ABO/RH(D): O POS
ABO/RH(D): O POS
Antibody Screen: NEGATIVE
Antibody Screen: NEGATIVE
Unit division: 0
Unit division: 0
Unit division: 0

## 2023-11-14 LAB — BPAM RBC
Blood Product Expiration Date: 202507072359
Blood Product Expiration Date: 202507072359
Blood Product Unit Number: 202507072359
ISSUE DATE / TIME: 202506041957
ISSUE DATE / TIME: 202506041957
PRODUCT CODE: 202506041934
PRODUCT CODE: 202507072359
Unit Type and Rh: 202507072359
Unit Type and Rh: 5100
Unit Type and Rh: 5100
Unit Type and Rh: 5100
Unit Type and Rh: 5100

## 2023-11-14 NOTE — Telephone Encounter (Signed)
 Patient scheduled for [facility] ED follow  within 7 days of discharge

## 2023-11-14 NOTE — Telephone Encounter (Signed)
 Procedure:ENTEROSCOPY Procedure date: 11/19/23 Procedure location: [facility] Arrival Time: 8:37 Spoke with the patient Y/N: Y Any prep concerns? N  Has the patient obtained the prep from the pharmacy ? N Do you have a care partner and transportation: Y Any additional concerns? N

## 2023-11-15 ENCOUNTER — Other Ambulatory Visit: Payer: Self-pay

## 2023-11-15 ENCOUNTER — Ambulatory Visit

## 2023-11-15 ENCOUNTER — Encounter

## 2023-11-15 VITALS — BP 119/55 | HR 91 | Temp 98.4°F | Resp 20 | Ht 75.0 in | Wt 199.2 lb

## 2023-11-15 DIAGNOSIS — D500 Iron deficiency anemia secondary to blood loss (chronic): Secondary | ICD-10-CM

## 2023-11-15 DIAGNOSIS — D509 Iron deficiency anemia, unspecified: Secondary | ICD-10-CM | POA: Diagnosis not present

## 2023-11-15 MED ORDER — IRON SUCROSE 20 MG/ML IV SOLN
200.0000 mg | Freq: Once | INTRAVENOUS | Status: AC
  Administered 2023-11-15: 200 mg via INTRAVENOUS
  Filled 2023-11-15: qty 10

## 2023-11-15 MED ORDER — DIPHENHYDRAMINE HCL 25 MG PO CAPS: 25.0000 mg | ORAL_CAPSULE | Freq: Once | ORAL | Status: DC

## 2023-11-15 MED ORDER — ACETAMINOPHEN 325 MG PO TABS: 650.0000 mg | ORAL_TABLET | Freq: Once | ORAL | Status: DC

## 2023-11-15 NOTE — Progress Notes (Signed)
 Diagnosis: Iron Deficiency Anemia  Provider:  Chilton Greathouse MD  Procedure: IV Push  IV Type: Peripheral, IV Location: L Antecubital  Venofer (Iron Sucrose), Dose: 200 mg  Post Infusion IV Care: Patient declined observation and Peripheral IV Discontinued  Discharge: Condition: Good, Destination: Home . AVS Declined  Performed by:  Rico Ala, LPN

## 2023-11-16 LAB — CBC WITH DIFFERENTIAL/PLATELET
Absolute Lymphocytes: 1584 {cells}/uL (ref 850–3900)
Absolute Monocytes: 590 {cells}/uL (ref 200–950)
Basophils Absolute: 29 {cells}/uL (ref 0–200)
Basophils Relative: 0.4 %
Eosinophils Absolute: 101 {cells}/uL (ref 15–500)
Eosinophils Relative: 1.4 %
HCT: 25.7 % — ABNORMAL LOW (ref 38.5–50.0)
Hemoglobin: 6.9 g/dL — ABNORMAL LOW (ref 13.2–17.1)
MCH: 24.7 pg — ABNORMAL LOW (ref 27.0–33.0)
MCHC: 26.8 g/dL — ABNORMAL LOW (ref 32.0–36.0)
MCV: 92.1 fL (ref 80.0–100.0)
MPV: 11.3 fL (ref 7.5–12.5)
Monocytes Relative: 8.2 %
Neutro Abs: 4896 {cells}/uL (ref 1500–7800)
Neutrophils Relative %: 68 %
Platelets: 399 10*3/uL (ref 140–400)
RBC: 2.79 10*6/uL — ABNORMAL LOW (ref 4.20–5.80)
RDW: 17.6 % — ABNORMAL HIGH (ref 11.0–15.0)
Total Lymphocyte: 22 %
WBC: 7.2 10*3/uL (ref 3.8–10.8)

## 2023-11-18 NOTE — Anesthesia Preprocedure Evaluation (Signed)
 Anesthesia Evaluation  Patient identified by MRN, date of birth, ID band Patient awake    Reviewed: Allergy & Precautions, NPO status , Patient's Chart, lab work & pertinent test results, reviewed documented beta blocker date and time   History of Anesthesia Complications Negative for: history of anesthetic complications  Airway Mallampati: II  TM Distance: >3 FB Neck ROM: Full    Dental no notable dental hx. (+) Missing, Partial Lower, Dental Advisory Given,    Pulmonary COPD (3L),  oxygen  dependent, Current Smoker and Patient abstained from smoking.   Pulmonary exam normal breath sounds clear to auscultation       Cardiovascular hypertension, Pt. on medications + CAD, + Peripheral Vascular Disease and +CHF  Normal cardiovascular exam Rhythm:Regular Rate:Normal   TTE 05/02/23: EF 55-60%, grade II DD, RV function moderately reduced, moderate RVE, mild LAE     Neuro/Psych  Headaches PSYCHIATRIC DISORDERS      CVA, No Residual Symptoms    GI/Hepatic Neg liver ROS, PUD,GERD  Medicated and Controlled,,GI bleed   Endo/Other  diabetes, Well Controlled, Type 2    Renal/GU      Musculoskeletal   Abdominal   Peds  Hematology  (+) Blood dyscrasia (Hgb 7.4), anemia  On xarelto   Lab Results      Component                Value               Date                               HGB                      6.9 (L)             11/15/2023                HCT                      25.7 (L)            11/15/2023   \Lab Results      Component                Value               Date                      NA                       142                 11/19/2023                CL                       97 (L)              11/19/2023                K                        3.7                 11/19/2023  CO2                      35 (H)              11/13/2023                BUN                      12                  11/19/2023                 CREATININE               0.80                11/19/2023                GFRNONAA                 >60                 11/13/2023                CALCIUM                   8.5 (L)             11/13/2023                PHOS                     3.0                 05/20/2023                ALBUMIN                   3.2 (L)             08/22/2023                GLUCOSE                  113 (H)             11/19/2023                        Anesthesia Other Findings All: lisinopril   Reproductive/Obstetrics                             Anesthesia Physical Anesthesia Plan  ASA: 4  Anesthesia Plan: General   Post-op Pain Management: Minimal or no pain anticipated   Induction: Intravenous  PONV Risk Score and Plan: 1 and Propofol  infusion, Treatment may vary due to age or medical condition and Ondansetron   Airway Management Planned: Oral ETT  Additional Equipment: None  Intra-op Plan:   Post-operative Plan: Extubation in OR  Informed Consent: I have reviewed the patients History and Physical, chart, labs and discussed the procedure including the risks, benefits and alternatives for the proposed anesthesia with the patient or authorized representative who has indicated his/her understanding and acceptance.     Dental advisory given  Plan Discussed with: CRNA and Surgeon  Anesthesia Plan Comments: (Anemia and recurrent AVMs)       Anesthesia Quick Evaluation

## 2023-11-19 ENCOUNTER — Ambulatory Visit (HOSPITAL_COMMUNITY)
Admission: RE | Admit: 2023-11-19 | Discharge: 2023-11-19 | Disposition: A | Discharge status: Home / Self Care | Attending: Gastroenterology | Admitting: Gastroenterology

## 2023-11-19 ENCOUNTER — Ambulatory Visit (HOSPITAL_COMMUNITY): Payer: Self-pay | Admitting: Certified Registered"

## 2023-11-19 ENCOUNTER — Ambulatory Visit (HOSPITAL_BASED_OUTPATIENT_CLINIC_OR_DEPARTMENT_OTHER): Payer: Self-pay | Admitting: Certified Registered"

## 2023-11-19 ENCOUNTER — Encounter (HOSPITAL_COMMUNITY): Payer: Self-pay | Admitting: Gastroenterology

## 2023-11-19 ENCOUNTER — Encounter (HOSPITAL_COMMUNITY)
Admission: RE | Disposition: A | Discharge status: Home / Self Care | Payer: Self-pay | Source: Home / Self Care | Attending: Gastroenterology

## 2023-11-19 DIAGNOSIS — Z79899 Other long term (current) drug therapy: Secondary | ICD-10-CM | POA: Diagnosis not present

## 2023-11-19 DIAGNOSIS — K2270 Barrett's esophagus without dysplasia: Secondary | ICD-10-CM | POA: Diagnosis not present

## 2023-11-19 DIAGNOSIS — I110 Hypertensive heart disease with heart failure: Secondary | ICD-10-CM | POA: Diagnosis not present

## 2023-11-19 DIAGNOSIS — Z7902 Long term (current) use of antithrombotics/antiplatelets: Secondary | ICD-10-CM | POA: Insufficient documentation

## 2023-11-19 DIAGNOSIS — K31819 Angiodysplasia of stomach and duodenum without bleeding: Secondary | ICD-10-CM | POA: Diagnosis not present

## 2023-11-19 DIAGNOSIS — E1151 Type 2 diabetes mellitus with diabetic peripheral angiopathy without gangrene: Secondary | ICD-10-CM | POA: Insufficient documentation

## 2023-11-19 DIAGNOSIS — K5520 Angiodysplasia of colon without hemorrhage: Secondary | ICD-10-CM

## 2023-11-19 DIAGNOSIS — I2510 Atherosclerotic heart disease of native coronary artery without angina pectoris: Secondary | ICD-10-CM | POA: Diagnosis not present

## 2023-11-19 DIAGNOSIS — I5020 Unspecified systolic (congestive) heart failure: Secondary | ICD-10-CM | POA: Diagnosis not present

## 2023-11-19 DIAGNOSIS — I5042 Chronic combined systolic (congestive) and diastolic (congestive) heart failure: Secondary | ICD-10-CM | POA: Diagnosis not present

## 2023-11-19 DIAGNOSIS — Z8673 Personal history of transient ischemic attack (TIA), and cerebral infarction without residual deficits: Secondary | ICD-10-CM | POA: Insufficient documentation

## 2023-11-19 DIAGNOSIS — K558 Other vascular disorders of intestine: Secondary | ICD-10-CM | POA: Insufficient documentation

## 2023-11-19 DIAGNOSIS — Z9981 Dependence on supplemental oxygen: Secondary | ICD-10-CM | POA: Insufficient documentation

## 2023-11-19 DIAGNOSIS — F17210 Nicotine dependence, cigarettes, uncomplicated: Secondary | ICD-10-CM | POA: Diagnosis not present

## 2023-11-19 DIAGNOSIS — Z7982 Long term (current) use of aspirin: Secondary | ICD-10-CM | POA: Insufficient documentation

## 2023-11-19 DIAGNOSIS — Z7901 Long term (current) use of anticoagulants: Secondary | ICD-10-CM | POA: Diagnosis not present

## 2023-11-19 DIAGNOSIS — I4891 Unspecified atrial fibrillation: Secondary | ICD-10-CM | POA: Insufficient documentation

## 2023-11-19 DIAGNOSIS — D509 Iron deficiency anemia, unspecified: Secondary | ICD-10-CM

## 2023-11-19 DIAGNOSIS — J449 Chronic obstructive pulmonary disease, unspecified: Secondary | ICD-10-CM | POA: Diagnosis not present

## 2023-11-19 DIAGNOSIS — K2289 Other specified disease of esophagus: Secondary | ICD-10-CM

## 2023-11-19 DIAGNOSIS — K3189 Other diseases of stomach and duodenum: Secondary | ICD-10-CM | POA: Diagnosis not present

## 2023-11-19 DIAGNOSIS — Z833 Family history of diabetes mellitus: Secondary | ICD-10-CM | POA: Insufficient documentation

## 2023-11-19 LAB — POCT I-STAT, CHEM 8
BUN: 12 mg/dL (ref 8–23)
Calcium, Ion: 1.11 mmol/L — ABNORMAL LOW (ref 1.15–1.40)
Chloride: 97 mmol/L — ABNORMAL LOW (ref 98–111)
Creatinine, Ser: 0.8 mg/dL (ref 0.61–1.24)
Glucose, Bld: 113 mg/dL — ABNORMAL HIGH (ref 70–99)
HCT: 31 % — ABNORMAL LOW (ref 39.0–52.0)
Hemoglobin: 10.5 g/dL — ABNORMAL LOW (ref 13.0–17.0)
Potassium: 3.7 mmol/L (ref 3.5–5.1)
Sodium: 142 mmol/L (ref 135–145)
TCO2: 35 mmol/L — ABNORMAL HIGH (ref 22–32)

## 2023-11-19 LAB — CBC
HCT: 30.8 % — ABNORMAL LOW (ref 39.0–52.0)
Hemoglobin: 8.1 g/dL — ABNORMAL LOW (ref 13.0–17.0)
MCH: 25.4 pg — ABNORMAL LOW (ref 26.0–34.0)
MCHC: 26.3 g/dL — ABNORMAL LOW (ref 30.0–36.0)
MCV: 96.6 fL (ref 80.0–100.0)
Platelets: 470 10*3/uL — ABNORMAL HIGH (ref 150–400)
RBC: 3.19 MIL/uL — ABNORMAL LOW (ref 4.22–5.81)
RDW: 20.1 % — ABNORMAL HIGH (ref 11.5–15.5)
WBC: 6.6 10*3/uL (ref 4.0–10.5)
nRBC: 0.5 % — ABNORMAL HIGH (ref 0.0–0.2)

## 2023-11-19 LAB — GLUCOSE, CAPILLARY: Glucose-Capillary: 138 mg/dL — ABNORMAL HIGH (ref 70–99)

## 2023-11-19 SURGERY — ENTEROSCOPY, USING BALLOON
Anesthesia: General

## 2023-11-19 MED ORDER — SUCCINYLCHOLINE CHLORIDE 200 MG/10ML IV SOSY
PREFILLED_SYRINGE | INTRAVENOUS | Status: DC | PRN
  Administered 2023-11-19: 100 mg via INTRAVENOUS

## 2023-11-19 MED ORDER — GLYCOPYRROLATE 0.2 MG/ML IJ SOLN
INTRAMUSCULAR | Status: DC | PRN
Start: 2023-11-19 — End: 2023-11-19
  Administered 2023-11-19: .2 mg via INTRAVENOUS

## 2023-11-19 MED ORDER — LIDOCAINE HCL (CARDIAC) PF 100 MG/5ML IV SOSY
PREFILLED_SYRINGE | INTRAVENOUS | Status: DC | PRN
  Administered 2023-11-19: 100 mg via INTRAVENOUS

## 2023-11-19 MED ORDER — SODIUM CHLORIDE 0.9 % IV SOLN
INTRAVENOUS | Status: DC | PRN
Start: 2023-11-19 — End: 2023-11-19

## 2023-11-19 MED ORDER — ROCURONIUM BROMIDE 10 MG/ML (PF) SYRINGE
PREFILLED_SYRINGE | INTRAVENOUS | Status: DC | PRN
  Administered 2023-11-19: 40 mg via INTRAVENOUS

## 2023-11-19 MED ORDER — SUGAMMADEX SODIUM 200 MG/2ML IV SOLN
INTRAVENOUS | Status: DC | PRN
Start: 2023-11-19 — End: 2023-11-19
  Administered 2023-11-19: 200 mg via INTRAVENOUS

## 2023-11-19 MED ORDER — PROPOFOL 10 MG/ML IV BOLUS
INTRAVENOUS | Status: AC
Start: 2023-11-19 — End: ?
  Filled 2023-11-19: qty 20

## 2023-11-19 MED ORDER — ALBUTEROL SULFATE HFA 108 (90 BASE) MCG/ACT IN AERS
INHALATION_SPRAY | RESPIRATORY_TRACT | Status: DC | PRN
  Administered 2023-11-19: 2 via RESPIRATORY_TRACT

## 2023-11-19 MED ORDER — PROPOFOL 10 MG/ML IV BOLUS
INTRAVENOUS | Status: DC | PRN
  Administered 2023-11-19: 130 mg via INTRAVENOUS

## 2023-11-19 MED ORDER — PROPOFOL 1000 MG/100ML IV EMUL
INTRAVENOUS | Status: AC
  Filled 2023-11-19: qty 100

## 2023-11-19 MED ORDER — PHENYLEPHRINE 80 MCG/ML (10ML) SYRINGE FOR IV PUSH (FOR BLOOD PRESSURE SUPPORT)
PREFILLED_SYRINGE | INTRAVENOUS | Status: DC | PRN
  Administered 2023-11-19 (×5): 80 ug via INTRAVENOUS

## 2023-11-19 MED ORDER — GLUCAGON HCL RDNA (DIAGNOSTIC) 1 MG IJ SOLR
INTRAMUSCULAR | Status: DC | PRN
  Administered 2023-11-19 (×2): .5 mg via INTRAVENOUS

## 2023-11-19 MED ORDER — ONDANSETRON HCL 4 MG/2ML IJ SOLN
INTRAMUSCULAR | Status: DC | PRN
  Administered 2023-11-19: 4 mg via INTRAVENOUS

## 2023-11-19 NOTE — Anesthesia Postprocedure Evaluation (Signed)
 Anesthesia Post Note  Patient: Jared [name]  Procedure(s) Performed: ENTEROSCOPY, USING BALLOON     Patient location during evaluation: Endoscopy Anesthesia Type: General Level of consciousness: awake and alert Pain management: pain level controlled Vital Signs Assessment: post-procedure vital signs reviewed and stable Respiratory status: spontaneous breathing, nonlabored ventilation, respiratory function stable and patient connected to nasal cannula oxygen  Cardiovascular status: blood pressure returned to baseline and stable Postop Assessment: no apparent nausea or vomiting Anesthetic complications: no  No notable events documented.  Last Vitals:  Vitals:   11/19/23 1300 11/19/23 1310  BP: 114/62 108/62  Pulse: 100 99  Resp: 17 16  Temp: (!) 36.1 C   SpO2: 100% 98%    Last Pain:  Vitals:   11/19/23 1310  TempSrc:   PainSc: 0-No pain                 Rosalita Combe

## 2023-11-19 NOTE — Discharge Instructions (Signed)
 YOU HAD AN ENDOSCOPIC PROCEDURE TODAY: Refer to the procedure report and other information in the discharge instructions given to you for any specific questions about what was found during the examination. If this information does not answer your questions, please call [place] office at [phone] to clarify.   YOU SHOULD EXPECT: Some feelings of bloating in the abdomen. Passage of more gas than usual. Walking can help get rid of the air that was put into your GI tract during the procedure and reduce the bloating. If you had a lower endoscopy (such as a colonoscopy or flexible sigmoidoscopy) you may notice spotting of blood in your stool or on the toilet paper. Some abdominal soreness may be present for a day or two, also.  DIET: Your first meal following the procedure should be a light meal and then it is ok to progress to your normal diet. A half-sandwich or bowl of soup is an example of a good first meal. Heavy or fried foods are harder to digest and may make you feel nauseous or bloated. Drink plenty of fluids but you should avoid alcoholic beverages for 24 hours. If you had a esophageal dilation, please see attached instructions for diet.    ACTIVITY: Your care partner should take you home directly after the procedure. You should plan to take it easy, moving slowly for the rest of the day. You can resume normal activity the day after the procedure however YOU SHOULD NOT DRIVE, use power tools, machinery or perform tasks that involve climbing or major physical exertion for 24 hours (because of the sedation medicines used during the test).   SYMPTOMS TO REPORT IMMEDIATELY: A gastroenterologist can be reached at any hour. Please call [phone]  for any of the following symptoms:   Following upper endoscopy (EGD, EUS, ERCP, esophageal dilation) Vomiting of blood or coffee ground material  New, significant abdominal pain  New, significant chest pain or pain under the shoulder blades  Painful or  persistently difficult swallowing  New shortness of breath  Black, tarry-looking or red, bloody stools  FOLLOW UP:  If any biopsies were taken you will be contacted by phone or by letter within the next 1-3 weeks. Call [phone]  if you have not heard about the biopsies in 3 weeks.  Please also call with any specific questions about appointments or follow up tests.

## 2023-11-19 NOTE — Progress Notes (Signed)
 Rhythm in recovery following enteroscopy is ST alternating with wide complex tachycardia which converts back to ST without intervention. Patient remains arousable and BP stable during rhythm change. Dr. Lasalle Pointer and Dr. Clarisse Crosby at bedside to assess patient.

## 2023-11-19 NOTE — Op Note (Signed)
 [facility] Patient Name: Jared [name] Procedure Date: 11/19/2023 MRN: [id number] Attending MD: Harry Lindau , MD, [id number] Date of Birth: 1956-02-06 CSN: [id number] Age: 68 Admit Type: Outpatient Procedure:                Small bowel enteroscopy (single balloon enteroscopy) Indications:              Iron  deficiency anemia, Arteriovenous malformation                            in the small intestine Providers:                Harry Lindau, MD, Ila Malay, RN, Marvelyn Slim, Technician Referring MD:             Eugenia Hess, MD Medicines:                Monitored Anesthesia Care, General Anesthesia Complications:            No immediate complications. Estimated Blood Loss:     Estimated blood loss: none. Procedure:                Pre-Anesthesia Assessment:                           - Prior to the procedure, a History and Physical                            was performed, and patient medications and                            allergies were reviewed. The patient's tolerance of                            previous anesthesia was also reviewed. The risks                            and benefits of the procedure and the sedation                            options and risks were discussed with the patient.                            All questions were answered, and informed consent                            was obtained. Prior Anticoagulants: The patient has                            taken Xarelto  (rivaroxaban ), last dose was 2 days                            prior to procedure. ASA Grade Assessment: IV - A  patient with severe systemic disease that is a                            constant threat to life. After reviewing the risks                            and benefits, the patient was deemed in                            satisfactory condition to undergo the procedure.                           After obtaining  informed consent, the endoscope was                            passed under direct vision. Throughout the                            procedure, the patient's blood pressure, pulse, and                            oxygen  saturations were monitored continuously. The                            [id number]-Q180 ([id number]) Olympus small bowel enteroscope                            was introduced through the mouth and advanced to                            the distal jejunum. The enteroscope was advanced                            deep into the small bowel. Once it could not be                            advanced any further, the balloon and overtube were                            engaged in through a series of balloon                            inflation/deflation's and enteroscope                            advancement/withdrawals, the small bowel                            enteroscope was advanced deeply into the small                            bowel. Advanced approximately 40 cm beyond the  second small bowel tattoo. The small bowel                            enteroscopy was accomplished without difficulty.                            The patient tolerated the procedure well. Scope In: Scope Out: Findings:      There were esophageal mucosal changes consistent with short-segment       Barrett's esophagus present in the lower third of the esophagus. The       maximum longitudinal extent of these mucosal changes was 1 cm in length.       This appeared similar to the previous endoscopies and was not biopsied.      The entire examined stomach was normal.      Two angioectasias with no bleeding were found in the second portion of       the duodenum and in the fourth portion of the duodenum. Coagulation for       hemostasis using argon plasma was successful. Estimated blood loss: none.      Two angioectasias with no bleeding were found in the proximal jejunum       and in the  mid-jejunum. Coagulation for hemostasis using argon plasma       was successful. Estimated blood loss: none.      Two separate tattoos were seen in the jejunum. Advanced to the       enteroscope approximately 40 cm distal to the second tattoo site.      A small mucosal disruption was found in the fourth portion of the       duodenum. The lesion was mucosal-based. No active bleeding. Able to       achieve full air insufflation without any issue. Out of an abundance of       caution and due to need to resume anticoagulation, I elected to place 1       hemostatic clip to both close the mucosal defect and for marking. There       was no bleeding during or after. Clip manufacturer: [employer] (2       clips failed to place appropriately). There was no bleeding at the end       of the procedure. Impression:               - Esophageal mucosal changes consistent with                            short-segment Barrett's esophagus.                           - Normal stomach.                           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                            Treated with argon plasma coagulation (APC).                           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  Treated with argon plasma coagulation (APC).                           - A tattoo was seen in the jejunum.                           - A small, superficial, mucosal disruption was seen                            in the fourth portion of the duodenum. Suspect this                            is due to the balloon apparatus. Clip (MR                            conditional) was placed. Clip manufacturer: [employer].                           - No specimens collected. Recommendation:           - Discharge patient to home (with escort).                           - Advance diet as tolerated.                           - Resume Xarelto  (rivaroxaban ) at  prior dose in 2                            days.                           - Repeat CBC check in 2 weeks.                           - Return to GI clinic at appointment to be                            scheduled.                           - Repeat the small bowel enteroscopy PRN for                            retreatment. Procedure Code(s):        --- Professional ---                           [phone], Small intestinal endoscopy, enteroscopy                            beyond second portion of duodenum, not including  ileum; with control of bleeding (eg, injection,                            bipolar cautery, unipolar cautery, laser, heater                            probe, stapler, plasma coagulator)                           44799, Unlisted procedure, small intestine Diagnosis Code(s):        --- Professional ---                           K22.89, Other specified disease of esophagus                           K31.819, Angiodysplasia of stomach and duodenum                            without bleeding                           K55.20, Angiodysplasia of colon without hemorrhage                           K31.89, Other diseases of stomach and duodenum                           D50.9, Iron  deficiency anemia, unspecified CPT copyright 2022 [employer]. All rights reserved. The codes documented in this report are preliminary and upon coder review may  be revised to meet current compliance requirements. Harry Lindau, MD 11/19/2023 16:10:96 PM Number of Addenda: 0

## 2023-11-19 NOTE — Transfer of Care (Signed)
 Immediate Anesthesia Transfer of Care Note  Patient: Jared [name]  Procedure(s) Performed: ENTEROSCOPY, USING BALLOON  Patient Location: PACU and Endoscopy Unit  Anesthesia Type:General  Level of Consciousness: awake, drowsy, and patient cooperative  Airway & Oxygen  Therapy: Patient Spontanous Breathing and Patient connected to face mask oxygen   Post-op Assessment: Report given to RN and Post -op Vital signs reviewed and stable  Post vital signs: Reviewed and stable  Last Vitals:  Vitals Value Taken Time  BP 145/58 11/19/23 1206  Temp    Pulse 116 11/19/23 1209  Resp 19 11/19/23 1209  SpO2 100 % 11/19/23 1209  Vitals shown include unfiled device data.  Last Pain:  Vitals:   11/19/23 0844  TempSrc: Temporal  PainSc: 0-No pain         Complications: No notable events documented.

## 2023-11-19 NOTE — Anesthesia Procedure Notes (Signed)
 Procedure Name: Intubation Date/Time: 11/19/2023 10:10 AM  Performed by: Alwyn Juba, CRNAPre-anesthesia Checklist: Patient identified, Emergency Drugs available, Suction available, Patient being monitored and Timeout performed Patient Re-evaluated:Patient Re-evaluated prior to induction Oxygen  Delivery Method: Circle system utilized Preoxygenation: Pre-oxygenation with 100% oxygen  Induction Type: IV induction and Rapid sequence Laryngoscope Size: Mac and 4 Tube type: Oral Tube size: 7.5 mm Number of attempts: 1 Airway Equipment and Method: Stylet Placement Confirmation: ETT inserted through vocal cords under direct vision, positive ETCO2 and breath sounds checked- equal and bilateral Secured at: 23 cm Tube secured with: Tape Dental Injury: Teeth and Oropharynx as per pre-operative assessment

## 2023-11-19 NOTE — H&P (Addendum)
 GASTROENTEROLOGY PROCEDURE H&P NOTE   Primary Care Physician: Joaquin Mulberry, MD    Reason for Procedure:  Iron  deficiency anemia, small bowel AVMs  Plan:    Single balloon enteroscopy   Patient is appropriate for endoscopic procedure(s) at [facility].  The nature of the procedure, as well as the risks, benefits, and alternatives were carefully and thoroughly reviewed with the patient. Ample time for discussion and questions allowed. The patient understood, was satisfied, and agreed to proceed.     HPI: Jared [name] is a 68 y.o. 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  Last push enteroscopy was 08/24/2023 by Dr. Yvone Herd and notable for irregular Z-line suspicious for Barrett's Esophagus, 2 nonbleeding AVMs in the duodenal bulb, tattoo in the jejunum, single nonbleeding AVM in the jejunum located 10-15 cm distal to the tattoo.  Each of the AVMs were treated with APC.  Since then, has been following in the Hematology clinic and was received RBC transfusion and IV iron  for suspected chronic slow oozing.  Does have a history of A-fib (on Xarelto ), CAD (on Plavix ), prior CVA, PAD, heart failure, COPD on O2 2 L chronically, diabetes, HTN.  - Push enteroscopy in 11/2020 with irregular Z-line, 2 cratered gastric cardia ulcers 1 with visible vessel and mild ooze, treated with hemostatic clips and epinephrine , moderate gastritis, otherwise normal small bowel with tattoo placed in the jejunum to mark distal extent reached. - Push enteroscopy done in October 2023 with finding of 3 AVMs in the duodenum all of which were treated with APC and a previously placed tattoo was noted in the jejunum. - EGD in July 2024 which was normal then - Repeat EGD/push enteroscopy in December 2024 with finding of a single AVM in the jejunum which was treated with APC, then  tattoo placement distal to that AVM. - Video capsule endoscopy in 09/2023 with 1 tiny nonbleeding AVM 1 minute into the small bowel, and additional small nonbleeding AVMs at 31 minutes, 1 hour 27 minutes, 1 hour 33 minutes without active bleeding.  Tattoo seen at 18 minutes.  First duodenal image at 13 minutes.  First cecal image at 3 hours 5 minutes.  His most recent colonoscopy was done in March 2020 which was completely normal other than hemorrhoids.   Past Medical History:  Diagnosis Date   Acute blood loss anemia 05/18/2023   Acute perforated appendicitis 04/30/2023   AKI (acute kidney injury) (HCC) 11/18/2020   Atrial fibrillation with RVR (HCC) 05/06/2023   AVM (arteriovenous malformation)    CAD (coronary artery disease)    a. LHC 5/12:  LAD 20, pLCx 20, pRCA 40, dRCA 40, EF 35%, diff HK  //  b. Myoview 4/16: Overall Impression:  High risk stress nuclear study There is no evidence of ischemia.  There is severe LV dysfunction. LV Ejection Fraction: 30%.  LV Wall Motion:  There is global LV hypokinesis.     CAP (community acquired pneumonia) 09/2013   Chronic combined systolic and diastolic CHF (congestive heart failure) (HCC)    a. Echo 4/16:Mild LVH, EF 40-45%, diffuse HK //  b. Echo 8/17: EF 35-40%, diffuse HK, diastolic dysfunction, aortic sclerosis, trivial MR, moderate LAE, normal RVSF, moderate RAE, mild TR, PASP 42 mmHg // c. Echo 4/18: Mild concentric LVH, EF 30-35, normal wall motion, grade 1 diastolic dysfunction, PASP 49   Chronic respiratory failure (HCC)  Cluster headache    "hx; haven't had one in awhile" (01/09/2016)   COPD (chronic obstructive pulmonary disease) (HCC)    Maximo Spar 01/09/2016   DM (diabetes mellitus) (HCC)    History of CVA (cerebrovascular accident)    Hypertension    IDA (iron  deficiency anemia)    Moderate tobacco use disorder    NICM (nonischemic cardiomyopathy) (HCC)    Nicotine  addiction    Peripheral vascular disease (HCC)    Syncope 06/11/2019    Tobacco abuse    Type 2 diabetes mellitus (HCC) 05/14/2016    Past Surgical History:  Procedure Laterality Date   ABDOMINAL AORTOGRAM W/LOWER EXTREMITY N/A 12/07/2022   Procedure: ABDOMINAL AORTOGRAM W/LOWER EXTREMITY;  Surgeon: Carlene Che, MD;  Location: [facility];  Service: Cardiovascular;  Laterality: N/A;   ABDOMINAL AORTOGRAM W/LOWER EXTREMITY Left 08/02/2023   Procedure: ABDOMINAL AORTOGRAM W/LOWER EXTREMITY;  Surgeon: Carlene Che, MD;  Location: [facility];  Service: Cardiovascular;  Laterality: Left;   BIOPSY  11/12/2020   Procedure: BIOPSY;  Surgeon: Normie Becton., MD;  Location: [facility] ENDOSCOPY;  Service: Gastroenterology;;   CARDIAC CATHETERIZATION  10/2010   LM normal, LAD with 20% irregularities, LCX with 20%, RCA with 40% prox and 40% distal - EF of 35%   CATARACT EXTRACTION, BILATERAL     COLONOSCOPY W/ BIOPSIES AND POLYPECTOMY     COLONOSCOPY WITH PROPOFOL  N/A 09/06/2018   Procedure: COLONOSCOPY WITH PROPOFOL ;  Surgeon: Lindle Rhea, MD;  Location: [facility] ENDOSCOPY;  Service: Gastroenterology;  Laterality: N/A;   ENTEROSCOPY N/A 09/28/2018   Procedure: ENTEROSCOPY;  Surgeon: Alvis Jourdain, MD;  Location: [facility] ENDOSCOPY;  Service: Endoscopy;  Laterality: N/A;   ENTEROSCOPY N/A 10/28/2018   Procedure: ENTEROSCOPY;  Surgeon: Lindle Rhea, MD;  Location: [facility] ENDOSCOPY;  Service: Gastroenterology;  Laterality: N/A;   ENTEROSCOPY N/A 10/09/2020   Procedure: ENTEROSCOPY;  Surgeon: Tobin Forts, MD;  Location: [facility] ENDOSCOPY;  Service: Endoscopy;  Laterality: N/A;   ENTEROSCOPY N/A 03/22/2022   Procedure: ENTEROSCOPY;  Surgeon: Ace Holder, MD;  Location: [facility] ENDOSCOPY;  Service: Gastroenterology;  Laterality: N/A;   ENTEROSCOPY N/A 05/20/2023   Procedure: ENTEROSCOPY;  Surgeon: Albertina Hugger, MD;  Location: [facility] ENDOSCOPY;  Service: Gastroenterology;  Laterality: N/A;   ENTEROSCOPY N/A 08/24/2023   Procedure: ENTEROSCOPY;  Surgeon: Truddie Furrow, MD;  Location: [facility] ENDOSCOPY;  Service: Gastroenterology;  Laterality: N/A;   ESOPHAGOGASTRODUODENOSCOPY N/A 11/12/2020   Procedure: ESOPHAGOGASTRODUODENOSCOPY (EGD);  Surgeon: Normie Becton., MD;  Location: [facility] ENDOSCOPY;  Service: Gastroenterology;  Laterality: N/A;   ESOPHAGOGASTRODUODENOSCOPY (EGD) WITH PROPOFOL  N/A 09/05/2018   Procedure: ESOPHAGOGASTRODUODENOSCOPY (EGD) WITH PROPOFOL ;  Surgeon: Ace Holder, MD;  Location: [facility] ENDOSCOPY;  Service: Gastroenterology;  Laterality: N/A;   ESOPHAGOGASTRODUODENOSCOPY (EGD) WITH PROPOFOL  N/A 11/19/2020   Procedure: ESOPHAGOGASTRODUODENOSCOPY (EGD) WITH PROPOFOL ;  Surgeon: Nannette Babe, MD;  Location: [facility] ENDOSCOPY;  Service: Gastroenterology;  Laterality: N/A;   ESOPHAGOGASTRODUODENOSCOPY (EGD) WITH PROPOFOL  N/A 12/27/2022   Procedure: ESOPHAGOGASTRODUODENOSCOPY (EGD) WITH PROPOFOL ;  Surgeon: Sergio Dandy, MD;  Location: [facility] ENDOSCOPY;  Service: Gastroenterology;  Laterality: N/A;   EXCISION MASS HEAD     GIVENS CAPSULE STUDY N/A 09/06/2018   Procedure: GIVENS CAPSULE STUDY;  Surgeon: Lindle Rhea, MD;  Location: [facility] ENDOSCOPY;  Service: Gastroenterology;  Laterality: N/A;   GIVENS CAPSULE STUDY N/A 09/26/2018   Procedure: GIVENS CAPSULE STUDY;  Surgeon: Nannette Babe, MD;  Location: [facility] ENDOSCOPY;  Service: Gastroenterology;  Laterality: N/A;  GIVENS CAPSULE STUDY N/A 06/14/2019   Procedure: GIVENS CAPSULE STUDY;  Surgeon: Sergio Dandy, MD;  Location: [facility] ENDOSCOPY;  Service: Endoscopy;  Laterality: N/A;   HEMOSTASIS CLIP PLACEMENT  11/12/2020   Procedure: HEMOSTASIS CLIP PLACEMENT;  Surgeon: Normie Becton., MD;  Location: [facility]OSCOPY;  Service: Gastroenterology;;   HEMOSTASIS CONTROL  11/12/2020   Procedure: HEMOSTASIS CONTROL;  Surgeon: Normie Becton., MD;  Location: [facility]OS[facility];  Service: Gastroenterology;;   HOT HEMOSTASIS N/A 10/28/2018   Procedure: HOT HEMOSTASIS (ARGON PLASMA  COAGULATION/BICAP);  Surgeon: Lindle Rhea, MD;  Location: [facility] ENDOSCOPY;  Service: Gastroenterology;  Laterality: N/A;   HOT HEMOSTASIS N/A 11/12/2020   Procedure: HOT HEMOSTASIS (ARGON PLASMA COAGULATION/BICAP);  Surgeon: Normie Becton., MD;  Location: [facility];  Service: Gastroenterology;  Laterality: N/A;   HOT HEMOSTASIS N/A 03/22/2022   Procedure: HOT HEMOSTASIS (ARGON PLASMA COAGULATION/BICAP);  Surgeon: Ace Holder, MD;  Location: [facility] ENDOSCOPY;  Service: Gastroenterology;  Laterality: N/A;   HOT HEMOSTASIS N/A 05/20/2023   Procedure: HOT HEMOSTASIS (ARGON PLASMA COAGULATION/BICAP);  Surgeon: Albertina Hugger, MD;  Location: [facility] ENDOSCOPY;  Service: Gastroenterology;  Laterality: N/A;   HOT HEMOSTASIS N/A 08/24/2023   Procedure: EGD, WITH ARGON PLASMA COAGULATION;  Surgeon: Truddie Furrow, MD;  Location: [facility] ENDOSCOPY;  Service: Gastroenterology;  Laterality: N/A;   INCISION AND DRAINAGE PERIRECTAL ABSCESS N/A 06/05/2017   Procedure: IRRIGATION AND DEBRIDEMENT PERIRECTAL ABSCESS;  Surgeon: Melvenia Stabs, MD;  Location: [facility] OR;  Service: General;  Laterality: N/A;   IR CATHETER TUBE CHANGE  05/14/2023   LAPAROSCOPIC APPENDECTOMY N/A 05/01/2023   Procedure: APPENDECTOMY LAPAROSCOPIC;  Surgeon: Enid Harry, MD;  Location: [facility] OR;  Service: General;  Laterality: N/A;   PERIPHERAL VASCULAR BALLOON ANGIOPLASTY Left 08/02/2023   Procedure: PERIPHERAL VASCULAR BALLOON ANGIOPLASTY;  Surgeon: Carlene Che, MD;  Location: [facility];  Service: Cardiovascular;  Laterality: Left;   PERIPHERAL VASCULAR INTERVENTION  12/07/2022   Procedure: PERIPHERAL VASCULAR INTERVENTION;  Surgeon: Carlene Che, MD;  Location: [facility];  Service: Cardiovascular;;   PERIPHERAL VASCULAR INTERVENTION Left 08/02/2023   Procedure: PERIPHERAL VASCULAR INTERVENTION;  Surgeon: Carlene Che, MD;  Location: [facility];  Service: Cardiovascular;  Laterality:  Left;   SUBMUCOSAL TATTOO INJECTION  11/12/2020   Procedure: SUBMUCOSAL TATTOO INJECTION;  Surgeon: Normie Becton., MD;  Location: [facility];  Service: Gastroenterology;;   SUBMUCOSAL TATTOO INJECTION  05/20/2023   Procedure: SUBMUCOSAL TATTOO INJECTION;  Surgeon: Albertina Hugger, MD;  Location: [facility] ENDOSCOPY;  Service: Gastroenterology;;   VIDEO BRONCHOSCOPY Bilateral 05/08/2016   Procedure: VIDEO BRONCHOSCOPY WITH FLUORO;  Surgeon: Lind Repine, MD;  Location: [facility] ENDOSCOPY;  Service: Cardiopulmonary;  Laterality: Bilateral;    Prior to Admission medications   Medication Sig Start Date End Date Taking? Authorizing Provider  acetaminophen  (TYLENOL ) 500 MG tablet Take 2 tablets (1,000 mg total) by mouth every 8 (eight) hours as needed for moderate pain. 01/03/23  Yes Newlin, Enobong, MD  albuterol  (VENTOLIN  HFA) 108 (90 Base) MCG/ACT inhaler INHALE 2 PUFFS INTO THE LUNGS EVERY 6 (SIX) HOURS AS NEEDED FOR WHEEZING OR SHORTNESS OF BREATH. 09/09/23  Yes Newlin, Enobong, MD  aspirin  EC 81 MG tablet Take 81 mg by mouth daily. Swallow whole.   Yes [provider]  budesonide -glycopyrrolate -formoterol  (BREZTRI  AEROSPHERE) 160-9-4.8 MCG/ACT AERO inhaler Take 2 puffs first thing in morning and then another 2 puffs about 12 hours later. Patient taking differently: Inhale 2 puffs  into the lungs in the morning and at bedtime. 08/15/23  Yes Diamond Formica, MD  ergocalciferol  (DRISDOL ) 1.25 MG (50000 UT) capsule Take 1 capsule (50,000 Units total) by mouth once a week. 11/13/23  Yes Newlin, Enobong, MD  ezetimibe  (ZETIA ) 10 MG tablet Take 1 tablet (10 mg total) by mouth daily. 06/06/23  Yes Elmyra Haggard, MD  ferrous sulfate  (FEROSUL) 325 (65 FE) MG tablet Take 1 tablet (325 mg total) by mouth 2 (two) times daily with a meal. 09/09/23  Yes Newlin, Enobong, MD  folic acid  (FOLVITE ) 1 MG tablet Take 1 tablet (1 mg total) by mouth daily. 09/09/23  Yes Newlin, Enobong, MD  furosemide  (LASIX ) 40 MG  tablet Take 1 tablet (40 mg total) by mouth 2 (two) times daily. 09/09/23  Yes Newlin, Enobong, MD  gabapentin  (NEURONTIN ) 300 MG capsule Take 1 capsule (300 mg total) by mouth 2 (two) times daily. 09/09/23  Yes Newlin, Enobong, MD  hydrALAZINE  (APRESOLINE ) 50 MG tablet Take 1 tablet (50 mg total) by mouth every 8 (eight) hours. Patient taking differently: Take 50 mg by mouth daily. 02/04/23  Yes Newlin, Enobong, MD  isosorbide  mononitrate (IMDUR ) 30 MG 24 hr tablet Take 1 tablet (30 mg total) by mouth daily. 06/10/23  Yes Newlin, Enobong, MD  metoprolol  succinate (TOPROL -XL) 50 MG 24 hr tablet Take 1 tablet (50 mg total) by mouth daily. 09/09/23  Yes Newlin, Enobong, MD  OXYGEN  Inhale 2 L/min into the lungs continuous.   Yes [provider]  pantoprazole  (PROTONIX ) 40 MG tablet Take 1 tablet (40 mg total) by mouth daily. 09/09/23  Yes Newlin, Enobong, MD  rivaroxaban  (XARELTO ) 20 MG TABS tablet Take 1 tablet (20 mg total) by mouth daily with supper. 09/09/23  Yes Newlin, Enobong, MD  albuterol  (PROVENTIL ) (2.5 MG/3ML) 0.083% nebulizer solution Take 3 mLs (2.5 mg total) by nebulization every 2 (two) hours as needed for wheezing or shortness of breath. 10/17/23   Diamond Formica, MD  atorvastatin  (LIPITOR ) 80 MG tablet Take 1 tablet (80 mg total) by mouth daily. 09/09/23   Newlin, Enobong, MD  DULoxetine  (CYMBALTA ) 60 MG capsule Take 1 capsule (60 mg total) by mouth daily. For chronic leg pains 09/09/23   Newlin, Enobong, MD  Multiple Vitamin (MULTIVITAMIN WITH MINERALS) TABS tablet Take 1 tablet by mouth daily. 05/22/23   Ghimire, Estil Heman, MD  nitroGLYCERIN  (NITROSTAT ) 0.4 MG SL tablet Place 0.4 mg under the tongue every 5 (five) minutes as needed for chest pain.    [provider]  TUDORZA PRESSAIR  400 MCG/ACT AEPB INHALE 1 PUFF INTO THE LUNGS IN THE MORNING AND AT BEDTIME. 03/29/20 04/04/20  Diamond Formica, MD    No current facility-administered medications for this encounter.     Allergies as of 10/24/2023 - Review Complete 10/21/2023  Allergen Reaction Noted   Lisinopril  Cough 06/29/2014    Family History  Problem Relation Age of Onset   Heart disease Mother    Diabetes Mother    Colon cancer Mother    Liver cancer Mother    Cancer Father        type unknown   Diabetes Sister        x 2   Diabetes Brother     Social History   Socioeconomic History   Marital status: Single    Spouse name: Not on file   Number of children: 1   Years of education: Not on file   Highest education level: 12th grade  Occupational History   Occupation: retired  Tobacco Use   Smoking status: Every Day    Current packs/day: 0.50    Average packs/day: 0.5 packs/day for 47.0 years (23.5 ttl pk-yrs)    Types: Cigarettes   Smokeless tobacco: Never   Tobacco comments:    4/5 cigs  Vaping Use   Vaping status: Never Used  Substance and Sexual Activity   Alcohol use: Not Currently    Alcohol/week: 0.0 standard drinks of alcohol    Comment: last drink was before xmas   Drug use: No    Types: Cocaine, Marijuana    Comment: "nothing in 20 years"   Sexual activity: Not Currently  Other Topics Concern   Not on file  Social History Narrative   unemployed   Social Drivers of Corporate investment banker Strain: Low Risk  (06/10/2023)   Overall Financial Resource Strain (CARDIA)    Difficulty of Paying Living Expenses: Not hard at all  Food Insecurity: No Food Insecurity (08/27/2023)   Hunger Vital Sign    Worried About Running Out of Food in the Last Year: Never true    Ran Out of Food in the Last Year: Never true  Recent Concern: Food Insecurity - Food Insecurity Present (06/10/2023)   Hunger Vital Sign    Worried About Running Out of Food in the Last Year: Sometimes true    Ran Out of Food in the Last Year: Never true  Transportation Needs: No Transportation Needs (08/27/2023)   PRAPARE - Administrator, Civil Service (Medical): No    Lack of  Transportation (Non-Medical): No  Physical Activity: Insufficiently Active (06/10/2023)   Exercise Vital Sign    Days of Exercise per Week: 3 days    Minutes of Exercise per Session: 20 min  Stress: No Stress Concern Present (06/10/2023)   [employer] of Occupational Health - Occupational Stress Questionnaire    Feeling of Stress : Not at all  Social Connections: Moderately Isolated (08/22/2023)   Social Connection and Isolation Panel [NHANES]    Frequency of Communication with Friends and Family: More than three times a week    Frequency of Social Gatherings with Friends and Family: Twice a week    Attends Religious Services: Never    Database administrator or Organizations: Yes    Attends Banker Meetings: 1 to 4 times per year    Marital Status: Divorced  Intimate Partner Violence: Not At Risk (08/27/2023)   Humiliation, Afraid, Rape, and Kick questionnaire    Fear of Current or Ex-Partner: No    Emotionally Abused: No    Physically Abused: No    Sexually Abused: No    Physical Exam: Vital signs in last 24 hours: @BP  125/60   Temp (!) 97.4 F (36.3 C) (Temporal)   Resp 20   Ht 6\' 3"  (1.905 m)   Wt 90.4 kg   SpO2 100%   BMI 24.90 kg/m  GEN: NAD EYE: Sclerae anicteric ENT: MMM CV: Non-tachycardic Pulm: CTA b/l GI: Soft, NT/ND NEURO:  Alert & Oriented x 3   Harry Lindau, DO [facility]   11/19/2023 9:38 AM

## 2023-11-19 NOTE — Interval H&P Note (Signed)
 History and Physical Interval Note:  11/19/2023 9:49 AM  Jared [name]  has presented today for surgery, with the diagnosis of anemia, recurrent AVMs.  The various methods of treatment have been discussed with the patient and family. After consideration of risks, benefits and other options for treatment, the patient has consented to  Procedure(s) with comments: ENTEROSCOPY, USING BALLOON (N/A) - single balloon enteroscopy as a surgical intervention.  The patient's history has been reviewed, patient examined, no change in status, stable for surgery.  I have reviewed the patient's chart and labs.  Questions were answered to the patient's satisfaction.     Jared [name]

## 2023-11-20 ENCOUNTER — Other Ambulatory Visit: Payer: Self-pay

## 2023-11-20 ENCOUNTER — Ambulatory Visit: Attending: Family Medicine | Admitting: Family Medicine

## 2023-11-20 ENCOUNTER — Encounter: Payer: Self-pay | Admitting: Family Medicine

## 2023-11-20 DIAGNOSIS — F17210 Nicotine dependence, cigarettes, uncomplicated: Secondary | ICD-10-CM

## 2023-11-20 DIAGNOSIS — I4892 Unspecified atrial flutter: Secondary | ICD-10-CM | POA: Diagnosis not present

## 2023-11-20 DIAGNOSIS — D500 Iron deficiency anemia secondary to blood loss (chronic): Secondary | ICD-10-CM | POA: Diagnosis not present

## 2023-11-20 DIAGNOSIS — I5042 Chronic combined systolic (congestive) and diastolic (congestive) heart failure: Secondary | ICD-10-CM | POA: Diagnosis not present

## 2023-11-20 DIAGNOSIS — I110 Hypertensive heart disease with heart failure: Secondary | ICD-10-CM | POA: Diagnosis not present

## 2023-11-20 DIAGNOSIS — I739 Peripheral vascular disease, unspecified: Secondary | ICD-10-CM

## 2023-11-20 DIAGNOSIS — J449 Chronic obstructive pulmonary disease, unspecified: Secondary | ICD-10-CM | POA: Diagnosis not present

## 2023-11-20 DIAGNOSIS — G629 Polyneuropathy, unspecified: Secondary | ICD-10-CM

## 2023-11-20 MED ORDER — ISOSORBIDE MONONITRATE ER 30 MG PO TB24
30.0000 mg | ORAL_TABLET | Freq: Every day | ORAL | 1 refills | Status: DC
  Filled 2023-11-20: qty 90, 90d supply, fill #0

## 2023-11-20 MED ORDER — HYDRALAZINE HCL 50 MG PO TABS
50.0000 mg | ORAL_TABLET | Freq: Three times a day (TID) | ORAL | 1 refills | Status: DC
  Filled 2023-11-20: qty 270, 90d supply, fill #0

## 2023-11-20 MED ORDER — GABAPENTIN 300 MG PO CAPS
300.0000 mg | ORAL_CAPSULE | Freq: Every day | ORAL | 1 refills | Status: AC
  Filled 2023-11-20 – 2024-02-13 (×2): qty 90, 90d supply, fill #0

## 2023-11-20 NOTE — Progress Notes (Signed)
 Virtual Visit via Video Note  I connected with [name], on 11/20/2023 at 11:27 AM by video enabled telemedicine device and verified that I am speaking with the correct person using two identifiers.  Clinician has audio - video capabilities but patient was only able to operate/preferred audio.    Consent: I discussed the limitations, risks, security and privacy concerns of performing an evaluation and management service by telemedicine and the availability of in person appointments. I also discussed with the patient that there may be a patient responsible charge related to this service. The patient expressed understanding and agreed to proceed.   Location of Patient: Home  Location of Provider: Clinic   Persons participating in Telemedicine visit: [facility] Dr. Adan Holms    Discussed the use of AI scribe software for clinical note transcription with the patient, who gave verbal consent to proceed.  History of Present Illness Jared [name] is a 68 year old male with  a history of  hypertension, COPD, tobacco abuse, NICM , CHF (EF 55-60% from echo 04/2023), atrial flutter, chronic respiratory failure with hypoxia (currently on 2-3 L of oxygen ), multiple hospitalizations for GI bleed with secondary anemia due to AV malformations, peptic ulcer disease, previous history of CVA, Type 2 DM, PAD status post left femoral-popliteal angioplasty and stenting (in 11/2022), status post laparoscopy appendectomy in 04/2023, iron  deficiency anemia (currently receiving IV iron  infusion weekly).  He had presented to the ED with fatigue and lightheadedness found to have a hemoglobin of 6.1 last week for which he was transfused.  Yesterday he underwent small bowel endoscopy which revealed nonbleeding angioectasias which was treated with APC.  He receives weekly iron  transfusions and is scheduled for another in two days.  He denies presence of hematochezia, hematemesis, hematuria Non of these were also  absent at his time of presentation to the ED His last hemoglobin level was 8.1 yesterday.  He is scheduled to see his hematologist on the 27th and a cardiologist in July.  He has atrial fibrillation and takes Xarelto . No ankle or leg swelling is observed.  He has COPD and uses oxygen  at 2 to 3 liters. He continues to smoke but not while on oxygen .  His medications include gabapentin  for neuropathy and PAD.  It appears Hydralazine  50 mg every 8 hours, and isosorbide  30 mg once daily were placed on hold on his med list however he states he has been taking it.  Blood pressure was normal at his last ED visit.[name]      Past Medical History:  Diagnosis Date   Acute blood loss anemia 05/18/2023   Acute perforated appendicitis 04/30/2023   AKI (acute kidney injury) (HCC) 11/18/2020   Atrial fibrillation with RVR (HCC) 05/06/2023   AVM (arteriovenous malformation)    CAD (coronary artery disease)    a. LHC 5/12:  LAD 20, pLCx 20, pRCA 40, dRCA 40, EF 35%, diff HK  //  b. Myoview 4/16: Overall Impression:  High risk stress nuclear study There is no evidence of ischemia.  There is severe LV dysfunction. LV Ejection Fraction: 30%.  LV Wall Motion:  There is global LV hypokinesis.     CAP (community acquired pneumonia) 09/2013   Chronic combined systolic and diastolic CHF (congestive heart failure) (HCC)    a. Echo 4/16:Mild LVH, EF 40-45%, diffuse HK //  b. Echo 8/17: EF 35-40%, diffuse HK, diastolic dysfunction, aortic sclerosis, trivial MR, moderate LAE, normal RVSF, moderate RAE, mild TR, PASP 42 mmHg // c. Echo  4/18: Mild concentric LVH, EF 30-35, normal wall motion, grade 1 diastolic dysfunction, PASP 49   Chronic respiratory failure (HCC)    Cluster headache    hx; haven't had one in awhile (01/09/2016)   COPD (chronic obstructive pulmonary disease) (HCC)    Maximo Spar 01/09/2016   DM (diabetes mellitus) (HCC)    History of CVA (cerebrovascular accident)    Hypertension    IDA (iron  deficiency  anemia)    Moderate tobacco use disorder    NICM (nonischemic cardiomyopathy) (HCC)    Nicotine  addiction    Peripheral vascular disease (HCC)    Syncope 06/11/2019   Tobacco abuse    Type 2 diabetes mellitus (HCC) 05/14/2016   Allergies  Allergen Reactions   Lisinopril  Cough    Current Outpatient Medications on File Prior to Visit  Medication Sig Dispense Refill   acetaminophen  (TYLENOL ) 500 MG tablet Take 2 tablets (1,000 mg total) by mouth every 8 (eight) hours as needed for moderate pain. 100 tablet 1   albuterol  (PROVENTIL ) (2.5 MG/3ML) 0.083% nebulizer solution Take 3 mLs (2.5 mg total) by nebulization every 2 (two) hours as needed for wheezing or shortness of breath. 75 mL 1   albuterol  (VENTOLIN  HFA) 108 (90 Base) MCG/ACT inhaler INHALE 2 PUFFS INTO THE LUNGS EVERY 6 (SIX) HOURS AS NEEDED FOR WHEEZING OR SHORTNESS OF BREATH. 18 g 6   aspirin  EC 81 MG tablet Take 81 mg by mouth daily. Swallow whole.     atorvastatin  (LIPITOR ) 80 MG tablet Take 1 tablet (80 mg total) by mouth daily. 90 tablet 1   budesonide -glycopyrrolate -formoterol  (BREZTRI  AEROSPHERE) 160-9-4.8 MCG/ACT AERO inhaler Take 2 puffs first thing in morning and then another 2 puffs about 12 hours later. (Patient taking differently: Inhale 2 puffs into the lungs in the morning and at bedtime.) 10.7 g 11   DULoxetine  (CYMBALTA ) 60 MG capsule Take 1 capsule (60 mg total) by mouth daily. For chronic leg pains 90 capsule 1   ergocalciferol  (DRISDOL ) 1.25 MG (50000 UT) capsule Take 1 capsule (50,000 Units total) by mouth once a week. 12 capsule 1   ezetimibe  (ZETIA ) 10 MG tablet Take 1 tablet (10 mg total) by mouth daily. 90 tablet 2   ferrous sulfate  (FEROSUL) 325 (65 FE) MG tablet Take 1 tablet (325 mg total) by mouth 2 (two) times daily with a meal. 180 tablet 1   folic acid  (FOLVITE ) 1 MG tablet Take 1 tablet (1 mg total) by mouth daily. 90 tablet 1   furosemide  (LASIX ) 40 MG tablet Take 1 tablet (40 mg total) by mouth 2  (two) times daily. 180 tablet 1   metoprolol  succinate (TOPROL -XL) 50 MG 24 hr tablet Take 1 tablet (50 mg total) by mouth daily. 90 tablet 1   Multiple Vitamin (MULTIVITAMIN WITH MINERALS) TABS tablet Take 1 tablet by mouth daily.     nitroGLYCERIN  (NITROSTAT ) 0.4 MG SL tablet Place 0.4 mg under the tongue every 5 (five) minutes as needed for chest pain.     OXYGEN  Inhale 2 L/min into the lungs continuous.     pantoprazole  (PROTONIX ) 40 MG tablet Take 1 tablet (40 mg total) by mouth daily. 90 tablet 1   rivaroxaban  (XARELTO ) 20 MG TABS tablet Take 1 tablet (20 mg total) by mouth daily with supper. 90 tablet 1   [DISCONTINUED] TUDORZA PRESSAIR  400 MCG/ACT AEPB INHALE 1 PUFF INTO THE LUNGS IN THE MORNING AND AT BEDTIME. 1 each 11   No current facility-administered medications on file prior to  visit.    ROS: See HPI  Observations/Objective: Awake, alert, oriented x3 Not in acute distress Normal mood      Latest Ref Rng & Units 11/19/2023    8:44 AM 11/13/2023    5:10 PM 08/26/2023    6:15 AM  CMP  Glucose 70 - 99 mg/dL 161  096  045   BUN 8 - 23 mg/dL 12  12  13    Creatinine 0.61 - 1.24 mg/dL 4.09  8.11  9.14   Sodium 135 - 145 mmol/L 142  140  140   Potassium 3.5 - 5.1 mmol/L 3.7  3.5  4.5   Chloride 98 - 111 mmol/L 97  96  101   CO2 22 - 32 mmol/L  35  35   Calcium  8.9 - 10.3 mg/dL  8.5  8.6     Lipid Panel     Component Value Date/Time   CHOL 127 10/25/2022 1027   TRIG 80 10/25/2022 1027   HDL 49 10/25/2022 1027   CHOLHDL 2.9 01/29/2020 1020   CHOLHDL 2.8 01/11/2016 0446   VLDL 14 01/11/2016 0446   LDLCALC 62 10/25/2022 1027   LABVLDL 16 10/25/2022 1027    Lab Results  Component Value Date   HGBA1C 5.4 06/10/2023      Latest Ref Rng & Units 11/19/2023    8:47 AM 11/19/2023    8:44 AM 11/15/2023    2:19 PM  CBC  WBC 4.0 - 10.5 K/uL 6.6   7.2   Hemoglobin 13.0 - 17.0 g/dL 8.1  78.2  6.9   Hematocrit 39.0 - 52.0 % 30.8  31.0  25.7   Platelets 150 - 400 K/uL 470    399      Assessment and plan: 1. Iron  deficiency anemia due to chronic blood loss (Primary) Asymptomatic Status post APC of nonbleeding angiectasia Last hemoglobin was 8.1 yesterday Scheduled for IV iron  infusion in 2 days Continue to follow-up with hematology   2. Hypertensive heart disease with chronic combined systolic and diastolic congestive heart failure (HCC) Euvolemic with EF of 50 to 55% from echo 04/2023 Continue current medications and follow-up with cardiology - isosorbide  mononitrate (IMDUR ) 30 MG 24 hr tablet; Take 1 tablet (30 mg total) by mouth daily.  Dispense: 90 tablet; Refill: 1 - hydrALAZINE  (APRESOLINE ) 50 MG tablet; Take 1 tablet (50 mg total) by mouth every 8 (eight) hours.  Dispense: 270 tablet; Refill: 1  3. Peripheral vascular disease (HCC) Status post left femoral-popliteal angioplasty and stenting in 11/2022 Asymptomatic - gabapentin  (NEURONTIN ) 300 MG capsule; Take 1 capsule (300 mg total) by mouth at bedtime. For leg pains  Dispense: 90 capsule; Refill: 1  4. COPD GOLD III/still smoking  Continues to smoke unfortunately He remains on 2 to 3 L of oxygen  at rest Continue albuterol  MDI as needed  5. Neuropathy Stable - gabapentin  (NEURONTIN ) 300 MG capsule; Take 1 capsule (300 mg total) by mouth at bedtime. For leg pains  Dispense: 90 capsule; Refill: 1  6. Cigarette smoker Counseled on smoking cessation but he is not ready to quit.  Spent 3 minutes counseling on this and hazardous effects on his health  7. Atrial flutter, unspecified type (HCC) Remains on anticoagulation with Xarelto  and rate control with metoprolol   There are no diagnoses linked to this encounter.   Meds ordered this encounter  Medications   isosorbide  mononitrate (IMDUR ) 30 MG 24 hr tablet    Sig: Take 1 tablet (30 mg total) by mouth daily.  Dispense:  90 tablet    Refill:  1   hydrALAZINE  (APRESOLINE ) 50 MG tablet    Sig: Take 1 tablet (50 mg total) by mouth every 8  (eight) hours.    Dispense:  270 tablet    Refill:  1   gabapentin  (NEURONTIN ) 300 MG capsule    Sig: Take 1 capsule (300 mg total) by mouth at bedtime. For leg pains    Dispense:  90 capsule    Refill:  1    Follow Up Instructions: 3 months   I discussed the assessment and treatment plan with the patient. The patient was provided an opportunity to ask questions and all were answered. The patient agreed with the plan and demonstrated an understanding of the instructions.   The patient was advised to call back or seek an in-person evaluation if the symptoms worsen or if the condition fails to improve as anticipated.     I provided 23 minutes total of Telehealth time during this encounter including median intraservice time, reviewing previous notes, investigations, ordering medications, medical decision making, coordinating care and patient verbalized understanding at the end of the visit.     Joaquin Mulberry, MD, FAAFP. [facility] [place], [place] [phone]   11/20/2023, 11:27 AM

## 2023-11-22 ENCOUNTER — Encounter (HOSPITAL_COMMUNITY): Payer: Self-pay | Admitting: Gastroenterology

## 2023-11-22 ENCOUNTER — Other Ambulatory Visit: Payer: Self-pay

## 2023-11-22 ENCOUNTER — Ambulatory Visit (INDEPENDENT_AMBULATORY_CARE_PROVIDER_SITE_OTHER)

## 2023-11-22 VITALS — BP 104/61 | HR 106 | Temp 98.1°F | Resp 18 | Ht 75.0 in | Wt 201.8 lb

## 2023-11-22 DIAGNOSIS — D500 Iron deficiency anemia secondary to blood loss (chronic): Secondary | ICD-10-CM | POA: Diagnosis not present

## 2023-11-22 DIAGNOSIS — Z8719 Personal history of other diseases of the digestive system: Secondary | ICD-10-CM | POA: Diagnosis not present

## 2023-11-22 DIAGNOSIS — E119 Type 2 diabetes mellitus without complications: Secondary | ICD-10-CM | POA: Diagnosis not present

## 2023-11-22 DIAGNOSIS — H26492 Other secondary cataract, left eye: Secondary | ICD-10-CM | POA: Diagnosis not present

## 2023-11-22 DIAGNOSIS — Z961 Presence of intraocular lens: Secondary | ICD-10-CM | POA: Diagnosis not present

## 2023-11-22 DIAGNOSIS — H43813 Vitreous degeneration, bilateral: Secondary | ICD-10-CM | POA: Diagnosis not present

## 2023-11-22 MED ORDER — ACETAMINOPHEN 325 MG PO TABS: 650.0000 mg | ORAL_TABLET | Freq: Once | ORAL | Status: DC

## 2023-11-22 MED ORDER — DIPHENHYDRAMINE HCL 25 MG PO CAPS: 25.0000 mg | ORAL_CAPSULE | Freq: Once | ORAL | Status: DC

## 2023-11-22 MED ORDER — IRON SUCROSE 20 MG/ML IV SOLN
200.0000 mg | Freq: Once | INTRAVENOUS | Status: AC
  Administered 2023-11-22: 200 mg via INTRAVENOUS
  Filled 2023-11-22: qty 10

## 2023-11-22 NOTE — Progress Notes (Signed)
 Diagnosis: Iron Deficiency Anemia  Provider:  Chilton Greathouse MD  Procedure: IV Push  IV Type: Peripheral, IV Location: L Antecubital  Venofer (Iron Sucrose), Dose: 200 mg  Post Infusion IV Care: Patient declined observation and Peripheral IV Discontinued  Discharge: Condition: Good, Destination: Home . AVS Declined  Performed by:  Adriana Mccallum, RN

## 2023-11-26 ENCOUNTER — Telehealth: Payer: Self-pay

## 2023-11-26 DIAGNOSIS — D500 Iron deficiency anemia secondary to blood loss (chronic): Secondary | ICD-10-CM

## 2023-11-26 DIAGNOSIS — Z7902 Long term (current) use of antithrombotics/antiplatelets: Secondary | ICD-10-CM

## 2023-11-26 NOTE — Telephone Encounter (Signed)
 Lab order and reminder in epic.   Called and spoke with patient to schedule a follow up with Dr. Yvone Herd, pt has been scheduled for Friday, 12/20/23 at 10:30 am. Patient knows that I will call him next week for labs. Pt verbalized understanding and had no concerns at the end of the call.

## 2023-11-26 NOTE — Telephone Encounter (Signed)
 Recommendation:  - Discharge patient to home ( with escort) .  - Advance diet as tolerated.  - Resume Xarelto  ( rivaroxaban ) at prior dose in 2 days.  - Repeat CBC check in 2 weeks.  - Return to GI clinic at appointment to be scheduled.  - Repeat the small bowel enteroscopy PRN for retreatment.

## 2023-11-29 ENCOUNTER — Ambulatory Visit

## 2023-11-29 VITALS — BP 117/67 | HR 95 | Temp 98.1°F | Resp 20 | Ht 75.0 in | Wt 195.2 lb

## 2023-11-29 DIAGNOSIS — K921 Melena: Secondary | ICD-10-CM | POA: Diagnosis not present

## 2023-11-29 DIAGNOSIS — D500 Iron deficiency anemia secondary to blood loss (chronic): Secondary | ICD-10-CM

## 2023-11-29 MED ORDER — DIPHENHYDRAMINE HCL 25 MG PO CAPS: 25.0000 mg | ORAL_CAPSULE | Freq: Once | ORAL | Status: DC

## 2023-11-29 MED ORDER — IRON SUCROSE 20 MG/ML IV SOLN
200.0000 mg | Freq: Once | INTRAVENOUS | Status: AC
  Administered 2023-11-29: 200 mg via INTRAVENOUS
  Filled 2023-11-29: qty 10

## 2023-11-29 MED ORDER — ACETAMINOPHEN 325 MG PO TABS: 650.0000 mg | ORAL_TABLET | Freq: Once | ORAL | Status: DC

## 2023-11-29 NOTE — Progress Notes (Cosign Needed)
 Diagnosis: Iron  Deficiency Anemia  Provider:  Praveen Mannam MD  Procedure: IV Push  IV Type: Peripheral, IV Location: L Hand  Venofer  (Iron  Sucrose), Dose: 200 mg  Post Infusion IV Care: Patient declined observation and Peripheral IV Discontinued  Discharge: Condition: Good, Destination: Home . AVS Provided  Performed by:  Lauran Pollard, LPN    Patient refused pre-medications. Nurse educated patient and stressed the importance of taking pre-medications as a precaution in the event of a medication reaction. Patient verbalized understanding.

## 2023-12-03 ENCOUNTER — Telehealth: Payer: Self-pay

## 2023-12-03 NOTE — Telephone Encounter (Signed)
-----   Message from Nurse Derby B sent at 11/26/2023 11:04 AM EDT ----- Regarding: repeat CBC CBC due - order in epic - McGreal

## 2023-12-03 NOTE — Telephone Encounter (Signed)
 Spoke with patient to remind him that he is due for repeat labs at this time. No appointment is necessary. Patient is aware that he can stop by the lab in the basement at his convenience between 7:30 AM - 5 PM, Monday through Friday. Patient states that he will have labs drawn on Friday. Patient verbalized understanding and had no concerns at the end of the call.

## 2023-12-06 ENCOUNTER — Other Ambulatory Visit: Payer: Self-pay

## 2023-12-06 ENCOUNTER — Inpatient Hospital Stay

## 2023-12-06 ENCOUNTER — Other Ambulatory Visit (INDEPENDENT_AMBULATORY_CARE_PROVIDER_SITE_OTHER)

## 2023-12-06 ENCOUNTER — Inpatient Hospital Stay (HOSPITAL_BASED_OUTPATIENT_CLINIC_OR_DEPARTMENT_OTHER): Admitting: Hematology

## 2023-12-06 ENCOUNTER — Encounter: Payer: Self-pay | Admitting: Hematology

## 2023-12-06 ENCOUNTER — Ambulatory Visit

## 2023-12-06 ENCOUNTER — Telehealth: Payer: Self-pay

## 2023-12-06 VITALS — BP 100/60 | HR 60 | Temp 97.8°F | Resp 20 | Wt 195.3 lb

## 2023-12-06 VITALS — BP 91/49 | HR 106 | Temp 99.0°F | Resp 16

## 2023-12-06 DIAGNOSIS — Z7902 Long term (current) use of antithrombotics/antiplatelets: Secondary | ICD-10-CM | POA: Diagnosis not present

## 2023-12-06 DIAGNOSIS — D500 Iron deficiency anemia secondary to blood loss (chronic): Secondary | ICD-10-CM

## 2023-12-06 DIAGNOSIS — K274 Chronic or unspecified peptic ulcer, site unspecified, with hemorrhage: Secondary | ICD-10-CM | POA: Diagnosis not present

## 2023-12-06 DIAGNOSIS — D649 Anemia, unspecified: Secondary | ICD-10-CM

## 2023-12-06 DIAGNOSIS — K31819 Angiodysplasia of stomach and duodenum without bleeding: Secondary | ICD-10-CM

## 2023-12-06 LAB — CBC WITH DIFFERENTIAL (CANCER CENTER ONLY)
Abs Immature Granulocytes: 0.02 10*3/uL (ref 0.00–0.07)
Basophils Absolute: 0 10*3/uL (ref 0.0–0.1)
Basophils Relative: 0 %
Eosinophils Absolute: 0.1 10*3/uL (ref 0.0–0.5)
Eosinophils Relative: 2 %
HCT: 24.6 % — ABNORMAL LOW (ref 39.0–52.0)
Hemoglobin: 6.7 g/dL — CL (ref 13.0–17.0)
Immature Granulocytes: 0 %
Lymphocytes Relative: 18 %
Lymphs Abs: 1.3 10*3/uL (ref 0.7–4.0)
MCH: 25.7 pg — ABNORMAL LOW (ref 26.0–34.0)
MCHC: 27.2 g/dL — ABNORMAL LOW (ref 30.0–36.0)
MCV: 94.3 fL (ref 80.0–100.0)
Monocytes Absolute: 0.8 10*3/uL (ref 0.1–1.0)
Monocytes Relative: 11 %
Neutro Abs: 5 10*3/uL (ref 1.7–7.7)
Neutrophils Relative %: 69 %
Platelet Count: 295 10*3/uL (ref 150–400)
RBC: 2.61 MIL/uL — ABNORMAL LOW (ref 4.22–5.81)
RDW: 17.6 % — ABNORMAL HIGH (ref 11.5–15.5)
WBC Count: 7.3 10*3/uL (ref 4.0–10.5)
nRBC: 0.4 % — ABNORMAL HIGH (ref 0.0–0.2)

## 2023-12-06 LAB — CBC WITH DIFFERENTIAL/PLATELET
Basophils Absolute: 0 10*3/uL (ref 0.0–0.1)
Basophils Relative: 0.7 % (ref 0.0–3.0)
Eosinophils Absolute: 0.1 10*3/uL (ref 0.0–0.7)
Eosinophils Relative: 1.5 % (ref 0.0–5.0)
HCT: 23.9 % — CL (ref 39.0–52.0)
Hemoglobin: 7 g/dL — CL (ref 13.0–17.0)
Lymphocytes Relative: 24.3 % (ref 12.0–46.0)
Lymphs Abs: 1.7 10*3/uL (ref 0.7–4.0)
MCHC: 29.3 g/dL — ABNORMAL LOW (ref 30.0–36.0)
MCV: 89.8 fl (ref 78.0–100.0)
Monocytes Absolute: 0.8 10*3/uL (ref 0.1–1.0)
Monocytes Relative: 10.9 % (ref 3.0–12.0)
Neutro Abs: 4.3 10*3/uL (ref 1.4–7.7)
Neutrophils Relative %: 62.6 % (ref 43.0–77.0)
Platelets: 293 10*3/uL (ref 150.0–400.0)
RBC: 2.66 Mil/uL — ABNORMAL LOW (ref 4.22–5.81)
RDW: 19.5 % — ABNORMAL HIGH (ref 11.5–15.5)
WBC: 6.9 10*3/uL (ref 4.0–10.5)

## 2023-12-06 LAB — IRON AND IRON BINDING CAPACITY (CC-WL,HP ONLY)
Iron: 17 ug/dL — ABNORMAL LOW (ref 45–182)
Saturation Ratios: 5 % — ABNORMAL LOW (ref 17.9–39.5)
TIBC: 364 ug/dL (ref 250–450)
UIBC: 347 ug/dL (ref 117–376)

## 2023-12-06 LAB — SAMPLE TO BLOOD BANK

## 2023-12-06 LAB — FERRITIN: Ferritin: 24 ng/mL (ref 24–336)

## 2023-12-06 LAB — PREPARE RBC (CROSSMATCH)

## 2023-12-06 MED ORDER — DIPHENHYDRAMINE HCL 25 MG PO CAPS: 25.0000 mg | ORAL_CAPSULE | Freq: Once | ORAL | Status: DC

## 2023-12-06 MED ORDER — ACETAMINOPHEN 325 MG PO TABS: 650.0000 mg | ORAL_TABLET | Freq: Once | ORAL | Status: DC

## 2023-12-06 MED ORDER — IRON SUCROSE 20 MG/ML IV SOLN: 200.0000 mg | Freq: Once | INTRAVENOUS | Status: DC

## 2023-12-06 NOTE — Telephone Encounter (Signed)
 McGreal, [name] HERO, MD  P [facility] Pod B Triage Hi nurses -  I received a critical lab result on Jared [name] with hemoglobin 7.0 and hematocrit 23.9.  In reviewing his chart, it appears he went for an iron  infusion today where he was noted to have a low hemoglobin.  The chart mentions that Dr. Lanny was notified.  It looks like someone from hematology may have ordered a blood transfusion but it is difficult for me to tell.  Can you please call [name] to check on him and see how he is feeling?  Please also find out if he has been contacted by hematology regarding a blood transfusion.  He has a history of small bowel AVMs and recently had a single balloon enteroscopy by Dr. San to ablate them.  We may need to consider referral to an academic center for double-balloon enteroscopy in the future.  Thanks,  [name]

## 2023-12-06 NOTE — Telephone Encounter (Signed)
 Called and spoke with patient. Patient did not receive Venofer  infusion due to low BP. Patient is scheduled for 2 units of blood on Monday. Patient denies any rectal bleeding, SOB, or chest pain. Patient reports fatigue but nothing worse than usual. Patient reports having regular BMs, last one today. Patient states that stools are dark brown/green, no black stools. Patient continues to take oral iron  BID. Patient has been advised to call on call provider if he has any severe GI symptoms or bleeding symptoms over the weekend.

## 2023-12-06 NOTE — Progress Notes (Signed)
 Critical Lab Value Reported:  Hbg 6.7  No Blood Bank Hold drawn.  Notified Dr. Lanny.

## 2023-12-06 NOTE — Progress Notes (Signed)
 Patient presented for last Venofer  200 mg infusion today. Blood pressure was low (78/50) in right arm. Patient denied any symptoms. Provided patient with water and notified clinical pharmacist Teldrin James and supervisor Marcey Na, DNP. Rechecked blood pressure sitting and standing. Educated patient that we would not administer IV iron  today due to possible side effect of hypotension and that patient should discuss blood pressure medications with primary care provider and cardiologist prior to rescheduling last infusion. Patient verbalized understanding and agreement.  Jared [name] Sar, RN

## 2023-12-06 NOTE — Progress Notes (Signed)
 [facility]   [phone] [phone]) [phone]   Clinic Follow up Note   Patient Care Team: Delbert Clam, MD as PCP - General (Family Medicine) Okey Vina GAILS, MD as PCP - Cardiology (Cardiology) [facility] Burton, Lacie K, NP as Nurse Practitioner (Hematology and Oncology) Joya Ade, DPM as Consulting Physician (Podiatry) 12/06/2023  I connected with Jared [name] on 12/06/23 at  3:40 PM EDT by telephone and verified that I am speaking with the correct person using two identifiers.   I discussed the limitations, risks, security and privacy concerns of performing an evaluation and management service by telephone and the availability of in person appointments. I also discussed with the patient that there may be a patient responsible charge related to this service. The patient expressed understanding and agreed to proceed.   Patient's location:  Home  Provider's location:  Office    CHIEF COMPLAINT: Fatigue   CURRENT THERAPY: IV iron   Oncology history Iron  deficiency anemia due to chronic blood loss secondary to h/o GI bleed, PUD, and inadequate iron  replacement -First found to have iron  deficiency 09/05/2018 with serum iron  9, 3% saturation, TIBC 358, and ferritin of 3 with a hemoglobin of 4.9, s/p blood transfusion  -EGD showed esophagitis, colonoscopy and small bowel endoscopy were unremarkable. Anemia improved -Developed acute on chronic anemia 10/2018, small bowel endoscopy  -Began receiving IV iron  at that time (Feraheme  510 mg x1 episodically), and been on iv venofer  lately, responded well  -Capsule endoscopy 06/18/19 showed gastritis, no AVMs or bleeding.  -continue iv iron  as needed, if ferrintin<30  Assessment & Plan Anemia due to GI bleeding Chronic anemia secondary to gastrointestinal bleeding, ongoing for approximately ten years. Recent endoscopy revealed minor bleeding sites which were sealed. Hemoglobin levels remain  low, necessitating blood transfusions and IV iron  therapy. Serum iron  levels are low, indicating insufficient iron  replacement. Potential treatment options include sandostatin injections or bevacizumab infusion to address abnormal blood vessels causing bleeding. Risks of bevacizumab include hypertension, proteinuria, and increased risk of stroke and heart attack, but benefits may outweigh risks given the need for frequent transfusions and iron  therapy. He has minor strokes, COPD, diabetes, and hypertension, which are considered in the treatment plan. - Administer blood transfusion on Monday. - Increase IV iron  dose to 300-400 mg weekly. - Start bevacizumab infusion pending insurance approval. - Schedule bevacizumab infusion for July 11. - Administer IV iron  at [facility] and in-office on alternating weeks.  Hypertension Hypertension is a comorbidity that may be exacerbated by bevacizumab treatment, which can cause increased blood pressure.  Diabetes Diabetes is a comorbidity that requires ongoing management.  Chronic Obstructive Pulmonary Disease (COPD) COPD is a comorbidity that requires ongoing management.  Plan - Lab reviewed, hemoglobin 7.0 today.  Will schedule 2 units of blood transfusion for next Monday - Increase weekly Venofer  to 300 mg, alternate at the [facility] yesterday and my office - Started bevacizumab every 2 weeks on July 11 - Follow-up in 4 weeks    Discussed the use of AI scribe software for clinical note transcription with the patient, who gave verbal consent to proceed.  History of Present Illness Jared [name] is a 68 year old male with anemia who presents for follow-up.  He experiences significant fatigue, which is more pronounced than usual. He has been receiving weekly IV iron  infusions since May at the [facility] location. A recent infusion was postponed due to low blood pressure.  A capsule endoscopy three  weeks ago showed small areas of  bleeding that were sealed during the procedure. He has a history of gastrointestinal bleeding for about ten years, with similar findings in past endoscopies.  His medical history includes COPD, diabetes, hypertension, and possible minor strokes.     REVIEW OF SYSTEMS:   Constitutional: Denies fevers, chills or abnormal weight loss Eyes: Denies blurriness of vision Ears, nose, mouth, throat, and face: Denies mucositis or sore throat Respiratory: Denies cough, dyspnea or wheezes Cardiovascular: Denies palpitation, chest discomfort or lower extremity swelling Gastrointestinal:  Denies nausea, heartburn or change in bowel habits Skin: Denies abnormal skin rashes Lymphatics: Denies new lymphadenopathy or easy bruising Neurological:Denies numbness, tingling or new weaknesses Behavioral/Psych: Mood is stable, no new changes  All other systems were reviewed with the patient and are negative.  MEDICAL HISTORY:  Past Medical History:  Diagnosis Date   Acute blood loss anemia 05/18/2023   Acute perforated appendicitis 04/30/2023   AKI (acute kidney injury) (HCC) 11/18/2020   Atrial fibrillation with RVR (HCC) 05/06/2023   AVM (arteriovenous malformation)    CAD (coronary artery disease)    a. LHC 5/12:  LAD 20, pLCx 20, pRCA 40, dRCA 40, EF 35%, diff HK  //  b. Myoview 4/16: Overall Impression:  High risk stress nuclear study There is no evidence of ischemia.  There is severe LV dysfunction. LV Ejection Fraction: 30%.  LV Wall Motion:  There is global LV hypokinesis.     CAP (community acquired pneumonia) 09/2013   Chronic combined systolic and diastolic CHF (congestive heart failure) (HCC)    a. Echo 4/16:Mild LVH, EF 40-45%, diffuse HK //  b. Echo 8/17: EF 35-40%, diffuse HK, diastolic dysfunction, aortic sclerosis, trivial MR, moderate LAE, normal RVSF, moderate RAE, mild TR, PASP 42 mmHg // c. Echo 4/18: Mild concentric LVH, EF 30-35, normal wall motion, grade 1 diastolic dysfunction, PASP  49   Chronic respiratory failure (HCC)    Cluster headache    hx; haven't had one in awhile (01/09/2016)   COPD (chronic obstructive pulmonary disease) (HCC)    thelbert 01/09/2016   DM (diabetes mellitus) (HCC)    History of CVA (cerebrovascular accident)    Hypertension    IDA (iron  deficiency anemia)    Moderate tobacco use disorder    NICM (nonischemic cardiomyopathy) (HCC)    Nicotine  addiction    Peripheral vascular disease (HCC)    Syncope 06/11/2019   Tobacco abuse    Type 2 diabetes mellitus (HCC) 05/14/2016    SURGICAL HISTORY: Past Surgical History:  Procedure Laterality Date   ABDOMINAL AORTOGRAM W/LOWER EXTREMITY N/A 12/07/2022   Procedure: ABDOMINAL AORTOGRAM W/LOWER EXTREMITY;  Surgeon: Magda Debby SAILOR, MD;  Location: [facility];  Service: Cardiovascular;  Laterality: N/A;   ABDOMINAL AORTOGRAM W/LOWER EXTREMITY Left 08/02/2023   Procedure: ABDOMINAL AORTOGRAM W/LOWER EXTREMITY;  Surgeon: Magda Debby SAILOR, MD;  Location: [facility];  Service: Cardiovascular;  Laterality: Left;   BALLOON ENTEROSCOPY N/A 11/19/2023   Procedure: ENTEROSCOPY, USING BALLOON;  Surgeon: San Sandor GAILS, DO;  Location: [facility] ENDOSCOPY;  Service: Gastroenterology;  Laterality: N/A;  single balloon enteroscopy   BIOPSY  11/12/2020   Procedure: BIOPSY;  Surgeon: Wilhelmenia Aloha Raddle., MD;  Location: [facility] ENDOSCOPY;  Service: Gastroenterology;;   CARDIAC CATHETERIZATION  10/2010   LM normal, LAD with 20% irregularities, LCX with 20%, RCA with 40% prox and 40% distal - EF of 35%   CATARACT EXTRACTION, BILATERAL     COLONOSCOPY W/ BIOPSIES  AND POLYPECTOMY     COLONOSCOPY WITH PROPOFOL  N/A 09/06/2018   Procedure: COLONOSCOPY WITH PROPOFOL ;  Surgeon: Eda Iha, MD;  Location: [facility] ENDOSCOPY;  Service: Gastroenterology;  Laterality: N/A;   ENTEROSCOPY N/A 09/28/2018   Procedure: ENTEROSCOPY;  Surgeon: Rollin Dover, MD;  Location: [facility] ENDOSCOPY;  Service: Endoscopy;  Laterality: N/A;    ENTEROSCOPY N/A 10/28/2018   Procedure: ENTEROSCOPY;  Surgeon: Eda Iha, MD;  Location: [facility] ENDOSCOPY;  Service: Gastroenterology;  Laterality: N/A;   ENTEROSCOPY N/A 10/09/2020   Procedure: ENTEROSCOPY;  Surgeon: Abran Norleen SAILOR, MD;  Location: [facility] ENDOSCOPY;  Service: Endoscopy;  Laterality: N/A;   ENTEROSCOPY N/A 03/22/2022   Procedure: ENTEROSCOPY;  Surgeon: Leigh Elspeth SQUIBB, MD;  Location: [facility] ENDOSCOPY;  Service: Gastroenterology;  Laterality: N/A;   ENTEROSCOPY N/A 05/20/2023   Procedure: ENTEROSCOPY;  Surgeon: Legrand Victory LITTIE DOUGLAS, MD;  Location: [facility] ENDOSCOPY;  Service: Gastroenterology;  Laterality: N/A;   ENTEROSCOPY N/A 08/24/2023   Procedure: ENTEROSCOPY;  Surgeon: Suzann [name] HERO, MD;  Location: [facility] ENDOSCOPY;  Service: Gastroenterology;  Laterality: N/A;   ESOPHAGOGASTRODUODENOSCOPY N/A 11/12/2020   Procedure: ESOPHAGOGASTRODUODENOSCOPY (EGD);  Surgeon: Wilhelmenia Aloha Raddle., MD;  Location: [facility];  Service: Gastroenterology;  Laterality: N/A;   ESOPHAGOGASTRODUODENOSCOPY (EGD) WITH PROPOFOL  N/A 09/05/2018   Procedure: ESOPHAGOGASTRODUODENOSCOPY (EGD) WITH PROPOFOL ;  Surgeon: Leigh Elspeth SQUIBB, MD;  Location: [facility] ENDOSCOPY;  Service: Gastroenterology;  Laterality: N/A;   ESOPHAGOGASTRODUODENOSCOPY (EGD) WITH PROPOFOL  N/A 11/19/2020   Procedure: ESOPHAGOGASTRODUODENOSCOPY (EGD) WITH PROPOFOL ;  Surgeon: Albertus Gordy HERO, MD;  Location: [facility] ENDOSCOPY;  Service: Gastroenterology;  Laterality: N/A;   ESOPHAGOGASTRODUODENOSCOPY (EGD) WITH PROPOFOL  N/A 12/27/2022   Procedure: ESOPHAGOGASTRODUODENOSCOPY (EGD) WITH PROPOFOL ;  Surgeon: Shila Gustav GAILS, MD;  Location: [facility] ENDOSCOPY;  Service: Gastroenterology;  Laterality: N/A;   EXCISION MASS HEAD     GIVENS CAPSULE STUDY N/A 09/06/2018   Procedure: GIVENS CAPSULE STUDY;  Surgeon: Eda Iha, MD;  Location: [facility] ENDOSCOPY;  Service: Gastroenterology;  Laterality: N/A;   GIVENS CAPSULE STUDY N/A 09/26/2018   Procedure: GIVENS CAPSULE  STUDY;  Surgeon: Albertus Gordy HERO, MD;  Location: [facility] ENDOSCOPY;  Service: Gastroenterology;  Laterality: N/A;   GIVENS CAPSULE STUDY N/A 06/14/2019   Procedure: GIVENS CAPSULE STUDY;  Surgeon: Shila Gustav GAILS, MD;  Location: [facility]OSCOPY;  Service: Endoscopy;  Laterality: N/A;   HEMOSTASIS CLIP PLACEMENT  11/12/2020   Procedure: HEMOSTASIS CLIP PLACEMENT;  Surgeon: Wilhelmenia Aloha Raddle., MD;  Location: [facility];  Service: Gastroenterology;;   HEMOSTASIS CONTROL  11/12/2020   Procedure: HEMOSTASIS CONTROL;  Surgeon: Wilhelmenia Aloha Raddle., MD;  Location: [facility]COPY;  Service: Gastroenterology;;   HOT HEMOSTASIS N/A 10/28/2018   Procedure: HOT HEMOSTASIS (ARGON PLASMA COAGULATION/BICAP);  Surgeon: Eda Iha, MD;  Location: [facility] ENDOSCOPY;  Service: Gastroenterology;  Laterality: N/A;   HOT HEMOSTASIS N/A 11/12/2020   Procedure: HOT HEMOSTASIS (ARGON PLASMA COAGULATION/BICAP);  Surgeon: Wilhelmenia Aloha Raddle., MD;  Location: [facility];  Service: Gastroenterology;  Laterality: N/A;   HOT HEMOSTASIS N/A 03/22/2022   Procedure: HOT HEMOSTASIS (ARGON PLASMA COAGULATION/BICAP);  Surgeon: Leigh Elspeth SQUIBB, MD;  Location: [facility] ENDOSCOPY;  Service: Gastroenterology;  Laterality: N/A;   HOT HEMOSTASIS N/A 05/20/2023   Procedure: HOT HEMOSTASIS (ARGON PLASMA COAGULATION/BICAP);  Surgeon: Legrand Victory LITTIE DOUGLAS, MD;  Location: [facility] ENDOSCOPY;  Service: Gastroenterology;  Laterality: N/A;   HOT HEMOSTASIS N/A 08/24/2023   Procedure: EGD, WITH ARGON PLASMA COAGULATION;  Surgeon: Suzann [name] HERO, MD;  Location: [facility] ENDOSCOPY;  Service: Gastroenterology;  Laterality: N/A;   INCISION AND DRAINAGE  PERIRECTAL ABSCESS N/A 06/05/2017   Procedure: IRRIGATION AND DEBRIDEMENT PERIRECTAL ABSCESS;  Surgeon: Teresa Lonni HERO, MD;  Location: [facility] OR;  Service: General;  Laterality: N/A;   IR CATHETER TUBE CHANGE  05/14/2023   LAPAROSCOPIC APPENDECTOMY N/A 05/01/2023   Procedure: APPENDECTOMY LAPAROSCOPIC;  Surgeon:  Ebbie Cough, MD;  Location: [facility] OR;  Service: General;  Laterality: N/A;   PERIPHERAL VASCULAR BALLOON ANGIOPLASTY Left 08/02/2023   Procedure: PERIPHERAL VASCULAR BALLOON ANGIOPLASTY;  Surgeon: Magda Debby SAILOR, MD;  Location: [facility];  Service: Cardiovascular;  Laterality: Left;   PERIPHERAL VASCULAR INTERVENTION  12/07/2022   Procedure: PERIPHERAL VASCULAR INTERVENTION;  Surgeon: Magda Debby SAILOR, MD;  Location: [facility];  Service: Cardiovascular;;   PERIPHERAL VASCULAR INTERVENTION Left 08/02/2023   Procedure: PERIPHERAL VASCULAR INTERVENTION;  Surgeon: Magda Debby SAILOR, MD;  Location: [facility];  Service: Cardiovascular;  Laterality: Left;   SUBMUCOSAL TATTOO INJECTION  11/12/2020   Procedure: SUBMUCOSAL TATTOO INJECTION;  Surgeon: Wilhelmenia Aloha Raddle., MD;  Location: [facility];  Service: Gastroenterology;;   SUBMUCOSAL TATTOO INJECTION  05/20/2023   Procedure: SUBMUCOSAL TATTOO INJECTION;  Surgeon: Legrand Victory LITTIE DOUGLAS, MD;  Location: [facility] ENDOSCOPY;  Service: Gastroenterology;;   VIDEO BRONCHOSCOPY Bilateral 05/08/2016   Procedure: VIDEO BRONCHOSCOPY WITH FLUORO;  Surgeon: Harden LULLA Staff, MD;  Location: [facility] ENDOSCOPY;  Service: Cardiopulmonary;  Laterality: Bilateral;    I have reviewed the social history and family history with the patient and they are unchanged from previous note.  ALLERGIES:  is allergic to lisinopril .  MEDICATIONS:  Current Outpatient Medications  Medication Sig Dispense Refill   acetaminophen  (TYLENOL ) 500 MG tablet Take 2 tablets (1,000 mg total) by mouth every 8 (eight) hours as needed for moderate pain. 100 tablet 1   albuterol  (PROVENTIL ) (2.5 MG/3ML) 0.083% nebulizer solution Take 3 mLs (2.5 mg total) by nebulization every 2 (two) hours as needed for wheezing or shortness of breath. 75 mL 1   albuterol  (VENTOLIN  HFA) 108 (90 Base) MCG/ACT inhaler INHALE 2 PUFFS INTO THE LUNGS EVERY 6 (SIX) HOURS AS NEEDED FOR WHEEZING OR  SHORTNESS OF BREATH. 18 g 6   aspirin  EC 81 MG tablet Take 81 mg by mouth daily. Swallow whole.     atorvastatin  (LIPITOR ) 80 MG tablet Take 1 tablet (80 mg total) by mouth daily. 90 tablet 1   budesonide -glycopyrrolate -formoterol  (BREZTRI  AEROSPHERE) 160-9-4.8 MCG/ACT AERO inhaler Take 2 puffs first thing in morning and then another 2 puffs about 12 hours later. (Patient taking differently: Inhale 2 puffs into the lungs in the morning and at bedtime.) 10.7 g 11   DULoxetine  (CYMBALTA ) 60 MG capsule Take 1 capsule (60 mg total) by mouth daily. For chronic leg pains 90 capsule 1   ergocalciferol  (DRISDOL ) 1.25 MG (50000 UT) capsule Take 1 capsule (50,000 Units total) by mouth once a week. 12 capsule 1   ezetimibe  (ZETIA ) 10 MG tablet Take 1 tablet (10 mg total) by mouth daily. 90 tablet 2   ferrous sulfate  (FEROSUL) 325 (65 FE) MG tablet Take 1 tablet (325 mg total) by mouth 2 (two) times daily with a meal. 180 tablet 1   folic acid  (FOLVITE ) 1 MG tablet Take 1 tablet (1 mg total) by mouth daily. 90 tablet 1   furosemide  (LASIX ) 40 MG tablet Take 1 tablet (40 mg total) by mouth 2 (two) times daily. 180 tablet 1   gabapentin  (NEURONTIN ) 300 MG capsule Take 1 capsule (300 mg total) by mouth at bedtime. For  leg pains 90 capsule 1   hydrALAZINE  (APRESOLINE ) 50 MG tablet Take 1 tablet (50 mg total) by mouth every 8 (eight) hours. 270 tablet 1   isosorbide  mononitrate (IMDUR ) 30 MG 24 hr tablet Take 1 tablet (30 mg total) by mouth daily. 90 tablet 1   metoprolol  succinate (TOPROL -XL) 50 MG 24 hr tablet Take 1 tablet (50 mg total) by mouth daily. 90 tablet 1   Multiple Vitamin (MULTIVITAMIN WITH MINERALS) TABS tablet Take 1 tablet by mouth daily.     nitroGLYCERIN  (NITROSTAT ) 0.4 MG SL tablet Place 0.4 mg under the tongue every 5 (five) minutes as needed for chest pain.     OXYGEN  Inhale 2 L/min into the lungs continuous.     pantoprazole  (PROTONIX ) 40 MG tablet Take 1 tablet (40 mg total) by mouth daily.  90 tablet 1   rivaroxaban  (XARELTO ) 20 MG TABS tablet Take 1 tablet (20 mg total) by mouth daily with supper. 90 tablet 1   No current facility-administered medications for this visit.    PHYSICAL EXAMINATION: Not performed   LABORATORY DATA:  I have reviewed the data as listed    Latest Ref Rng & Units 12/06/2023    2:56 PM 12/06/2023    1:56 PM 11/19/2023    8:47 AM  CBC  WBC 4.0 - 10.5 K/uL 7.3  6.9  6.6   Hemoglobin 13.0 - 17.0 g/dL 6.7  7.0 Repeated and verified X2.  8.1   Hematocrit 39.0 - 52.0 % 24.6  23.9 Repeated and verified X2.  30.8   Platelets 150 - 400 K/uL 295  293.0  470         Latest Ref Rng & Units 11/19/2023    8:44 AM 11/13/2023    5:10 PM 08/26/2023    6:15 AM  CMP  Glucose 70 - 99 mg/dL 886  808  896   BUN 8 - 23 mg/dL 12  12  13    Creatinine 0.61 - 1.24 mg/dL 9.19  9.14  9.24   Sodium 135 - 145 mmol/L 142  140  140   Potassium 3.5 - 5.1 mmol/L 3.7  3.5  4.5   Chloride 98 - 111 mmol/L 97  96  101   CO2 22 - 32 mmol/L  35  35   Calcium  8.9 - 10.3 mg/dL  8.5  8.6       RADIOGRAPHIC STUDIES: I have personally reviewed the radiological images as listed and agreed with the findings in the report. No results found.     I discussed the assessment and treatment plan with the patient. The patient was provided an opportunity to ask questions and all were answered. The patient agreed with the plan and demonstrated an understanding of the instructions.   The patient was advised to call back or seek an in-person evaluation if the symptoms worsen or if the condition fails to improve as anticipated.  I provided 25 minutes of non face-to-face telephone visit time during this encounter, including review of chart and various tests results, discussions about plan of care and coordination of care plan.    Onita Mattock, MD 12/06/23

## 2023-12-06 NOTE — Assessment & Plan Note (Signed)
 secondary to h/o GI bleed, PUD, and inadequate iron  replacement -First found to have iron  deficiency 09/05/2018 with serum iron  9, 3% saturation, TIBC 358, and ferritin of 3 with a hemoglobin of 4.9, s/p blood transfusion  -EGD showed esophagitis, colonoscopy and small bowel endoscopy were unremarkable. Anemia improved -Developed acute on chronic anemia 10/2018, small bowel endoscopy  -Began receiving IV iron  at that time (Feraheme  510 mg x1 episodically), and been on iv venofer  lately, responded well  -Capsule endoscopy 06/18/19 showed gastritis, no AVMs or bleeding.  -continue iv iron  as needed, if ferrintin<30

## 2023-12-06 NOTE — Patient Instructions (Signed)
 Your blood pressure was low today and we did not administer IV iron . Please contact your primary care provider (Dr. Newlin) and your cardiologist (Dr. Okey) to discuss your blood pressure medications prior to rescheduling your last infusion. Thank you.

## 2023-12-09 ENCOUNTER — Ambulatory Visit: Admitting: Family Medicine

## 2023-12-09 ENCOUNTER — Inpatient Hospital Stay

## 2023-12-09 DIAGNOSIS — K274 Chronic or unspecified peptic ulcer, site unspecified, with hemorrhage: Secondary | ICD-10-CM | POA: Diagnosis not present

## 2023-12-09 DIAGNOSIS — D649 Anemia, unspecified: Secondary | ICD-10-CM

## 2023-12-09 DIAGNOSIS — D500 Iron deficiency anemia secondary to blood loss (chronic): Secondary | ICD-10-CM | POA: Diagnosis not present

## 2023-12-09 MED ORDER — SODIUM CHLORIDE 0.9% IV SOLUTION
250.0000 mL | INTRAVENOUS | Status: DC
  Administered 2023-12-09: 250 mL via INTRAVENOUS

## 2023-12-09 MED ORDER — ACETAMINOPHEN 325 MG PO TABS
650.0000 mg | ORAL_TABLET | Freq: Once | ORAL | Status: AC
  Administered 2023-12-09: 650 mg via ORAL
  Filled 2023-12-09: qty 2

## 2023-12-09 MED ORDER — DIPHENHYDRAMINE HCL 25 MG PO CAPS
25.0000 mg | ORAL_CAPSULE | Freq: Once | ORAL | Status: AC
  Administered 2023-12-09: 25 mg via ORAL
  Filled 2023-12-09: qty 1

## 2023-12-09 NOTE — Patient Instructions (Signed)

## 2023-12-09 NOTE — Telephone Encounter (Signed)
 He has an appt with you on 12/20/23 at 1030 am.  Does this need to be cancelled and rescheduled?

## 2023-12-10 LAB — TYPE AND SCREEN
ABO/RH(D): O POS
Antibody Screen: NEGATIVE
Unit division: 0
Unit division: 0

## 2023-12-10 LAB — BPAM RBC
Blood Product Expiration Date: 202507282359
Blood Product Unit Number: 202507282359
ISSUE DATE / TIME: 202506301007
PRODUCT CODE: 202506301007
PRODUCT CODE: 202507282359
Unit Type and Rh: 5100
Unit Type and Rh: 202507282359
Unit Type and Rh: 5100
Unit Type and Rh: 5100

## 2023-12-11 ENCOUNTER — Ambulatory Visit: Attending: Cardiology | Admitting: Physician Assistant

## 2023-12-11 ENCOUNTER — Encounter: Payer: Self-pay | Admitting: Physician Assistant

## 2023-12-11 ENCOUNTER — Other Ambulatory Visit: Payer: Self-pay

## 2023-12-11 VITALS — BP 100/56 | HR 119 | Wt 198.2 lb

## 2023-12-11 DIAGNOSIS — J449 Chronic obstructive pulmonary disease, unspecified: Secondary | ICD-10-CM

## 2023-12-11 DIAGNOSIS — D649 Anemia, unspecified: Secondary | ICD-10-CM | POA: Diagnosis not present

## 2023-12-11 DIAGNOSIS — I480 Paroxysmal atrial fibrillation: Secondary | ICD-10-CM | POA: Diagnosis not present

## 2023-12-11 DIAGNOSIS — I10 Essential (primary) hypertension: Secondary | ICD-10-CM

## 2023-12-11 DIAGNOSIS — I5042 Chronic combined systolic (congestive) and diastolic (congestive) heart failure: Secondary | ICD-10-CM

## 2023-12-11 DIAGNOSIS — Z8673 Personal history of transient ischemic attack (TIA), and cerebral infarction without residual deficits: Secondary | ICD-10-CM | POA: Diagnosis not present

## 2023-12-11 DIAGNOSIS — E785 Hyperlipidemia, unspecified: Secondary | ICD-10-CM | POA: Diagnosis not present

## 2023-12-11 DIAGNOSIS — I2510 Atherosclerotic heart disease of native coronary artery without angina pectoris: Secondary | ICD-10-CM

## 2023-12-11 MED ORDER — HYDRALAZINE HCL 25 MG PO TABS
25.0000 mg | ORAL_TABLET | Freq: Three times a day (TID) | ORAL | 5 refills | Status: DC
  Filled 2023-12-11: qty 90, 30d supply, fill #0
  Filled 2024-02-13: qty 90, 30d supply, fill #1

## 2023-12-11 NOTE — Patient Instructions (Signed)
 Medication Instructions:  DECREASE HYDRALAZINE  TO 25 MG THREE TIMES A DAY *If you need a refill on your cardiac medications before your next appointment, please call your pharmacy*  Lab Work: NO LABS If you have labs (blood work) drawn today and your tests are completely normal, you will receive your results only by: MyChart Message (if you have MyChart) OR A paper copy in the mail If you have any lab test that is abnormal or we need to change your treatment, we will call you to review the results.  Testing/Procedures: NO TESTING  Follow-Up: At [facility], you and your health needs are our priority.  As part of our continuing mission to provide you with exceptional heart care, our providers are all part of one team.  This team includes your primary Cardiologist (physician) and Advanced Practice Providers or APPs (Physician Assistants and Nurse Practitioners) who all work together to provide you with the care you need, when you need it.  Your next appointment:   6 month(s)  Provider:   Vina Gull, MD

## 2023-12-11 NOTE — Progress Notes (Signed)
 Cardiology Office Note   Date:  12/11/2023  ID:  Jared [name], DOB 09/08/1955, MRN [id number] PCP: Jared Clam, MD  [facility] Providers Cardiologist:  Jared Gull, MD     History of Present Illness Jared [name] is a 68 y.o. male with past medical history of CAD, chronic combined systolic and diastolic heart failure, hypertension, hyperlipidemia, history of CVA, PAF on Xarelto , COPD on chronic home O2 and tobacco abuse.  Patient had a mild CAD by cath in 2012.  Myoview in 2016 showed no ischemia.  EF in 2018 was 30 to 35%.  By November 2019, ejection fraction improved to 45 to 50%.  He had PTCA/stent to left femoral-popliteal artery in June 2024.  He had anemia and was seen in the ER afterward and underwent EGD that showed no active bleeding.  He received iron  transfusion.  He was admitted with sepsis secondary to ruptured appendix in November 2024 and underwent laparoscopic appendectomy surgery by Dr. Ebbie.  Echocardiogram obtained on 04/30/2023 showed EF 55 to 60%, grade 2 DD, moderately reduced RV, no significant valve issue.  Patient was last seen by Jared Fabry PA-C in January 2025.  Since then, he was admitted to the hospital in February due to critical limb ischemia and underwent lower extremity angiography by Dr. Magda on 08/02/2023 which revealed stent malfunction in mid SFA due to the previous stent prolapsing on itself causing a functional stenosis, this was treated with a covered stent.  Patient was discharged with aspirin , Plavix  and Eliquis after the procedure.  Unfortunately, he had symptomatic anemia on triple therapy and was admitted in March 2025 with a hemoglobin of 6.1. Although EKG at the time was interpreted as A-fib, however on closer examination, it appears patient was in sinus tachycardia with PACs. He required 3 units of packed red blood cell transfusion.  Endoscopy showed 3 nonbleeding angiectasias treated with APC.  Plavix  was discontinued upon discharge.  Patient  was left on aspirin  and Xarelto . He was seen in the ED again on 11/13/2023 due to symptomatic anemia, hemoglobin dropped down to 6.1.  Patient underwent blood transfusion in the emergency room and discharged to have periodic outpatient blood transfusion.  He underwent repeat EGD on 11/19/2023 that showed short segment of Barrett's esophagus, 2 nonbleeding angiectasia in duodenum treated with APC, 2 nonbleeding angioectasias in the jejunum also treated with APC, small superficial mucosal disruption seen in the fourth portion of the duodenum which was clipped.  He had another 2 units of blood transfusion on 6/30.   Patient presents today for follow-up.  His blood pressure has been borderline low, I will reduce his hydralazine  to 25 mg 3 times a day.  His heart rate is elevated, EKG shows sinus tachycardia with PACs.  He denies any chest pain.  His breathing is at his baseline.  He is currently on 3 L oxygen  24/7.  He appears to be euvolemic on exam.  Anemia is being followed by GI service.  Overall, he is stable from the cardiac perspective and can follow-up in 6 months.   ROS:   He denies chest pain, palpitations, dyspnea, pnd, orthopnea, n, v, dizziness, syncope, edema, weight gain, or early satiety. All other systems reviewed and are otherwise negative except as noted above.    Studies Reviewed EKG Interpretation Date/Time:  Wednesday December 11 2023 13:22:38 EDT Ventricular Rate:  119 PR Interval:  152 QRS Duration:  88 QT Interval:  344 QTC Calculation: 483 R Axis:   270  Text  Interpretation: Sinus tachycardia with PACs When compared with ECG of 22-Aug-2023 11:41, Sinus rhythm has replaced Atrial fibrillation Inferior infarct is now Present Confirmed by Jared [name] [phone]) on 12/11/2023 1:42:04 PM     Risk Assessment/Calculations  CHA2DS2-VASc Score = 6   This indicates a 9.7% annual risk of stroke. The patient's score is based upon: CHF History: 1 HTN History: 1 Diabetes History: 0 Stroke  History: 2 Vascular Disease History: 1 Age Score: 1 Gender Score: 0            Physical Exam VS:  BP (!) 100/56   Pulse (!) 119   Wt 198 lb 3.2 oz (89.9 kg)   SpO2 (!) 87%   BMI 24.77 kg/m        Wt Readings from Last 3 Encounters:  12/11/23 198 lb 3.2 oz (89.9 kg)  12/09/23 197 lb 12.8 oz (89.7 kg)  12/06/23 195 lb 4.8 oz (88.6 kg)    GEN: Well nourished, well developed in no acute distress NECK: No JVD; No carotid bruits CARDIAC: tachycardic, no murmurs, rubs, gallops RESPIRATORY:  Clear to auscultation without rales, wheezing or rhonchi  ABDOMEN: Soft, non-tender, non-distended EXTREMITIES:  No edema; No deformity   ASSESSMENT AND PLAN  Chronic combined systolic and diastolic heart failure: EF improved on last echocardiogram in November 2024.  EF 55 to 60% the time.  He appears to be euvolemic on exam.  Anemia: Recurrent episode of anemia.  Likely contributing to his tachycardia.  Followed by GI service.  Hypertension: Blood pressure soft today, will reduce hydralazine  to 25 mg 3 times daily dosing  Hyperlipidemia: On atorvastatin  80 mg daily  Nonobstructive CAD: Denies any chest pain  History of CVA: No recent recurrence  History of PAF: On Xarelto  and metoprolol  succinate  COPD: On 3 L home oxygen  24/7.         Dispo: Follow-up in 89-month  Signed, Jared [name], [place]

## 2023-12-16 NOTE — Telephone Encounter (Signed)
 [name],  Thank you for attending to this while I was out of the office last week.  I am out of the office on inpatient consult service at this time, returning on 12/25/2023.  I appreciate you seeing him and that available clinic visit, and I agree that he needs evaluation for DBE (preferably at [facility]).  VEAR Brand MD

## 2023-12-17 ENCOUNTER — Other Ambulatory Visit: Payer: Self-pay

## 2023-12-18 ENCOUNTER — Other Ambulatory Visit: Payer: Self-pay | Admitting: Hematology

## 2023-12-18 NOTE — Progress Notes (Signed)
 Pharmacist Chemotherapy Monitoring - Initial Assessment    Anticipated start date: 12/26/23   The following has been reviewed per standard work regarding the patient's treatment regimen: The patient's diagnosis, treatment plan and drug doses, and organ/hematologic function Lab orders and baseline tests specific to treatment regimen  The treatment plan start date, drug sequencing, and pre-medications Prior authorization status  Patient's documented medication list, including drug-drug interaction screen and prescriptions for anti-emetics and supportive care specific to the treatment regimen The drug concentrations, fluid compatibility, administration routes, and timing of the medications to be used The patient's access for treatment and lifetime cumulative dose history, if applicable  The patient's medication allergies and previous infusion related reactions, if applicable   Changes made to treatment plan:  N/A  Follow up needed:  N/A   Jared [name], PharmD, MBA

## 2023-12-19 ENCOUNTER — Ambulatory Visit

## 2023-12-19 VITALS — BP 103/59 | HR 102 | Temp 98.0°F | Resp 18 | Ht 75.0 in | Wt 197.0 lb

## 2023-12-19 DIAGNOSIS — D500 Iron deficiency anemia secondary to blood loss (chronic): Secondary | ICD-10-CM

## 2023-12-19 DIAGNOSIS — K921 Melena: Secondary | ICD-10-CM

## 2023-12-19 MED ORDER — SODIUM CHLORIDE 0.9 % IV SOLN
300.0000 mg | Freq: Once | INTRAVENOUS | Status: AC
  Administered 2023-12-19: 300 mg via INTRAVENOUS
  Filled 2023-12-19: qty 15

## 2023-12-19 NOTE — Progress Notes (Signed)
 [facility] Return Visit   Referring Provider Delbert Clam, MD [address] [place] [address] [place],  [place] [address]  Primary Care Provider Delbert Clam, MD  Patient Profile: Jared [name] is a 68 y.o. male with a past medical history noteworthy for CAD, NICM/ HFrEF(EF 55-60%  04/2023), atrial flutter, PAF (on ASA, Xarelto ), HTN, NSVT, COPD on 3 L O2, T2DM, IDA who returns to the [facility] for follow-up of the problem(s) noted below.  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 pylori gastritis 11/2020; breath test negative 09/2021 and 12/2021 History of gastric intestinal metaplasia on gastric biopsy 11/2020 GERD Family history of colorectal cancer in a first-degree relative - mother   History of Present Illness   Jared [name] was last seen during inpatient consultation 08/2023 for weakness, anemia without overt GI bleeding  He is followed by Dr. 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 Jared [name] has had a long history of iron  deficiency anemia and GI blood loss secondary to small bowel angioectasias He has undergone numerous EGD, SBE, VCE as outlined under data review which have shown small bowel angioectasias and gastric ulcers in 2022  He was most recently hospitalized in March 2025 for weakness, anemia Hgb 6.1 (baseline 8 range)  SBE 08/22/2023 -2 duodenal angioectasias and 1 jejunal angioectasias status post APC  Status post hospitalization hemoglobin declined  8.8 --> 7.7 without overt bleeding VCE 09/2023 -1 duodenal angioectasia and 3 jejunal angioectasias  Jared [name] was referred for single balloon enteroscopy completed 11/19/2023 This showed 2 duodenal and 2 jejunal angioectasias status  post APC  Over the month of June 2025, hemoglobin declined to 8.1 --> 7.0 He received 2 units of packed red blood cells 12/06/2023 and iron  sucrose 12/19/2023 Hematology note 11/2023 indicates initiation of bevacizumab  for management of bleeding related to small bowel angioectasias starting this week with further consideration of Sandostatin  At today's visit, he reports that his bowel movements have overall been normal Typically has 2-3 formed, brown bowel movements per day Occasionally stools are green or black but he states that black stools are rare Continues on oral iron  supplementation twice daily Has rare abdominal discomfort  At today's visit we discussed the possibility of referral to [facility] for consideration of double-balloon enteroscopy At this juncture he prefers to wait on referral to [facility] to see what his response may be to bevacizumab   GERD GERD symptoms are currently stable on pantoprazole  40 mg orally daily Denies pyrosis, regurgitation, chest pain, dysphagia or odynophagia  Family history of colorectal cancer in first-degree relative-mother Reports that his mother had a form of cancer that spread that he thinks was a primary colon cancer Diagnosis of presumed colon cancer was made after the age of 6 Last colonoscopy performed 08/29/2018 was normal without polyps Discussed that if his family history is correct he would be due for colorectal cancer screening -would also assess for colonic angioectasias We will review this with Dr. Arthuro would need to be scheduled at the hospital due to his medical comorbidities and baseline O2 requirement  GI Review of Symptoms Significant for  None. Otherwise negative.  General Review of Systems  Review of systems is significant for the pertinent positives  and negatives as listed per the HPI.  Full ROS is otherwise negative.  Past Medical History   Past Medical History:  Diagnosis Date   Acute blood loss anemia 05/18/2023   Acute  perforated appendicitis 04/30/2023   AKI (acute kidney injury) (HCC) 11/18/2020   Atrial fibrillation with RVR (HCC) 05/06/2023   AVM (arteriovenous malformation)    CAD (coronary artery disease)    a. LHC 5/12:  LAD 20, pLCx 20, pRCA 40, dRCA 40, EF 35%, diff HK  //  b. Myoview 4/16: Overall Impression:  High risk stress nuclear study There is no evidence of ischemia.  There is severe LV dysfunction. LV Ejection Fraction: 30%.  LV Wall Motion:  There is global LV hypokinesis.     CAP (community acquired pneumonia) 09/2013   Chronic combined systolic and diastolic CHF (congestive heart failure) (HCC)    a. Echo 4/16:Mild LVH, EF 40-45%, diffuse HK //  b. Echo 8/17: EF 35-40%, diffuse HK, diastolic dysfunction, aortic sclerosis, trivial MR, moderate LAE, normal RVSF, moderate RAE, mild TR, PASP 42 mmHg // c. Echo 4/18: Mild concentric LVH, EF 30-35, normal wall motion, grade 1 diastolic dysfunction, PASP 49   Chronic respiratory failure (HCC)    Cluster headache    hx; haven't had one in awhile (01/09/2016)   COPD (chronic obstructive pulmonary disease) (HCC)    thelbert 01/09/2016   DM (diabetes mellitus) (HCC)    History of CVA (cerebrovascular accident)    Hypertension    IDA (iron  deficiency anemia)    Moderate tobacco use disorder    NICM (nonischemic cardiomyopathy) (HCC)    Nicotine  addiction    Peripheral vascular disease (HCC)    Syncope 06/11/2019   Tobacco abuse    Type 2 diabetes mellitus (HCC) 05/14/2016     Past Surgical History   Past Surgical History:  Procedure Laterality Date   ABDOMINAL AORTOGRAM W/LOWER EXTREMITY N/A 12/07/2022   Procedure: ABDOMINAL AORTOGRAM W/LOWER EXTREMITY;  Surgeon: Magda Debby SAILOR, MD;  Location: [facility];  Service: Cardiovascular;  Laterality: N/A;   ABDOMINAL AORTOGRAM W/LOWER EXTREMITY Left 08/02/2023   Procedure: ABDOMINAL AORTOGRAM W/LOWER EXTREMITY;  Surgeon: Magda Debby SAILOR, MD;  Location: [facility];  Service:  Cardiovascular;  Laterality: Left;   BALLOON ENTEROSCOPY N/A 11/19/2023   Procedure: ENTEROSCOPY, USING BALLOON;  Surgeon: San Sandor GAILS, DO;  Location: [facility] ENDOSCOPY;  Service: Gastroenterology;  Laterality: N/A;  single balloon enteroscopy   BIOPSY  11/12/2020   Procedure: BIOPSY;  Surgeon: Wilhelmenia Aloha Raddle., MD;  Location: [facility] ENDOSCOPY;  Service: Gastroenterology;;   CARDIAC CATHETERIZATION  10/2010   LM normal, LAD with 20% irregularities, LCX with 20%, RCA with 40% prox and 40% distal - EF of 35%   CATARACT EXTRACTION, BILATERAL     COLONOSCOPY W/ BIOPSIES AND POLYPECTOMY     COLONOSCOPY WITH PROPOFOL  N/A 09/06/2018   Procedure: COLONOSCOPY WITH PROPOFOL ;  Surgeon: Eda Iha, MD;  Location: [facility] ENDOSCOPY;  Service: Gastroenterology;  Laterality: N/A;   ENTEROSCOPY N/A 09/28/2018   Procedure: ENTEROSCOPY;  Surgeon: Rollin Dover, MD;  Location: [facility] ENDOSCOPY;  Service: Endoscopy;  Laterality: N/A;   ENTEROSCOPY N/A 10/28/2018   Procedure: ENTEROSCOPY;  Surgeon: Eda Iha, MD;  Location: [facility] ENDOSCOPY;  Service: Gastroenterology;  Laterality: N/A;   ENTEROSCOPY N/A 10/09/2020   Procedure: ENTEROSCOPY;  Surgeon: Abran Norleen SAILOR, MD;  Location: [facility] ENDOSCOPY;  Service: Endoscopy;  Laterality: N/A;   ENTEROSCOPY N/A 03/22/2022   Procedure: ENTEROSCOPY;  Surgeon: Leigh Standing  P, MD;  Location: [facility] ENDOSCOPY;  Service: Gastroenterology;  Laterality: N/A;   ENTEROSCOPY N/A 05/20/2023   Procedure: ENTEROSCOPY;  Surgeon: Legrand Victory LITTIE DOUGLAS, MD;  Location: [facility] ENDOSCOPY;  Service: Gastroenterology;  Laterality: N/A;   ENTEROSCOPY N/A 08/24/2023   Procedure: ENTEROSCOPY;  Surgeon: Suzann [name] HERO, MD;  Location: [facility] ENDOSCOPY;  Service: Gastroenterology;  Laterality: N/A;   ESOPHAGOGASTRODUODENOSCOPY N/A 11/12/2020   Procedure: ESOPHAGOGASTRODUODENOSCOPY (EGD);  Surgeon: Wilhelmenia Aloha Raddle., MD;  Location: [facility]OSCOPY;  Service: Gastroenterology;  Laterality: N/A;    ESOPHAGOGASTRODUODENOSCOPY (EGD) WITH PROPOFOL  N/A 09/05/2018   Procedure: ESOPHAGOGASTRODUODENOSCOPY (EGD) WITH PROPOFOL ;  Surgeon: Leigh Elspeth SQUIBB, MD;  Location: [facility] ENDOSCOPY;  Service: Gastroenterology;  Laterality: N/A;   ESOPHAGOGASTRODUODENOSCOPY (EGD) WITH PROPOFOL  N/A 11/19/2020   Procedure: ESOPHAGOGASTRODUODENOSCOPY (EGD) WITH PROPOFOL ;  Surgeon: Albertus Gordy HERO, MD;  Location: [facility] ENDOSCOPY;  Service: Gastroenterology;  Laterality: N/A;   ESOPHAGOGASTRODUODENOSCOPY (EGD) WITH PROPOFOL  N/A 12/27/2022   Procedure: ESOPHAGOGASTRODUODENOSCOPY (EGD) WITH PROPOFOL ;  Surgeon: Shila Gustav GAILS, MD;  Location: [facility] ENDOSCOPY;  Service: Gastroenterology;  Laterality: N/A;   EXCISION MASS HEAD     GIVENS CAPSULE STUDY N/A 09/06/2018   Procedure: GIVENS CAPSULE STUDY;  Surgeon: Eda Iha, MD;  Location: [facility] ENDOSCOPY;  Service: Gastroenterology;  Laterality: N/A;   GIVENS CAPSULE STUDY N/A 09/26/2018   Procedure: GIVENS CAPSULE STUDY;  Surgeon: Albertus Gordy HERO, MD;  Location: [facility] ENDOSCOPY;  Service: Gastroenterology;  Laterality: N/A;   GIVENS CAPSULE STUDY N/A 06/14/2019   Procedure: GIVENS CAPSULE STUDY;  Surgeon: Shila Gustav GAILS, MD;  Location: [facility] ENDOSCOPY;  Service: Endoscopy;  Laterality: N/A;   HEMOSTASIS CLIP PLACEMENT  11/12/2020   Procedure: HEMOSTASIS CLIP PLACEMENT;  Surgeon: Wilhelmenia Aloha Raddle., MD;  Location: [facility]OS[facility];  Service: Gastroenterology;;   HEMOSTASIS CONTROL  11/12/2020   Procedure: HEMOSTASIS CONTROL;  Surgeon: Wilhelmenia Aloha Raddle., MD;  Location: [facility]OSCOPY;  Service: Gastroenterology;;   HOT HEMOSTASIS N/A 10/28/2018   Procedure: HOT HEMOSTASIS (ARGON PLASMA COAGULATION/BICAP);  Surgeon: Eda Iha, MD;  Location: [facility] ENDOSCOPY;  Service: Gastroenterology;  Laterality: N/A;   HOT HEMOSTASIS N/A 11/12/2020   Procedure: HOT HEMOSTASIS (ARGON PLASMA COAGULATION/BICAP);  Surgeon: Wilhelmenia Aloha Raddle., MD;  Location: [facility];  Service:  Gastroenterology;  Laterality: N/A;   HOT HEMOSTASIS N/A 03/22/2022   Procedure: HOT HEMOSTASIS (ARGON PLASMA COAGULATION/BICAP);  Surgeon: Leigh Elspeth SQUIBB, MD;  Location: [facility] ENDOSCOPY;  Service: Gastroenterology;  Laterality: N/A;   HOT HEMOSTASIS N/A 05/20/2023   Procedure: HOT HEMOSTASIS (ARGON PLASMA COAGULATION/BICAP);  Surgeon: Legrand Victory LITTIE DOUGLAS, MD;  Location: [facility] ENDOSCOPY;  Service: Gastroenterology;  Laterality: N/A;   HOT HEMOSTASIS N/A 08/24/2023   Procedure: EGD, WITH ARGON PLASMA COAGULATION;  Surgeon: Suzann [name] HERO, MD;  Location: [facility] ENDOSCOPY;  Service: Gastroenterology;  Laterality: N/A;   INCISION AND DRAINAGE PERIRECTAL ABSCESS N/A 06/05/2017   Procedure: IRRIGATION AND DEBRIDEMENT PERIRECTAL ABSCESS;  Surgeon: Teresa Lonni HERO, MD;  Location: [facility] OR;  Service: General;  Laterality: N/A;   IR CATHETER TUBE CHANGE  05/14/2023   LAPAROSCOPIC APPENDECTOMY N/A 05/01/2023   Procedure: APPENDECTOMY LAPAROSCOPIC;  Surgeon: Ebbie Cough, MD;  Location: [facility] OR;  Service: General;  Laterality: N/A;   PERIPHERAL VASCULAR BALLOON ANGIOPLASTY Left 08/02/2023   Procedure: PERIPHERAL VASCULAR BALLOON ANGIOPLASTY;  Surgeon: Magda Debby SAILOR, MD;  [facility]: [facility];  Service: Cardiovascular;  Laterality: Left;   PERIPHERAL VASCULAR INTERVENTION  12/07/2022   Procedure: PERIPHERAL VASCULAR INTERVENTION;  Surgeon: Magda Debby SAILOR, MD;  Location: [facility] INVASIVE  CV LAB;  Service: Cardiovascular;;   PERIPHERAL VASCULAR INTERVENTION Left 08/02/2023   Procedure: PERIPHERAL VASCULAR INTERVENTION;  Surgeon: Magda Debby SAILOR, MD;  Location: [facility];  Service: Cardiovascular;  Laterality: Left;   SUBMUCOSAL TATTOO INJECTION  11/12/2020   Procedure: SUBMUCOSAL TATTOO INJECTION;  Surgeon: Wilhelmenia Aloha Raddle., MD;  Location: [facility];  Service: Gastroenterology;;   SUBMUCOSAL TATTOO INJECTION  05/20/2023   Procedure: SUBMUCOSAL TATTOO INJECTION;  Surgeon: Legrand Victory LITTIE DOUGLAS, MD;  Location: [facility] ENDOSCOPY;  Service: Gastroenterology;;   VIDEO BRONCHOSCOPY Bilateral 05/08/2016   Procedure: VIDEO BRONCHOSCOPY WITH FLUORO;  Surgeon: Harden LULLA Staff, MD;  Location: [facility] ENDOSCOPY;  Service: Cardiopulmonary;  Laterality: Bilateral;     Allergies and Medications   Allergies  Allergen Reactions   Lisinopril  Cough   Current Meds  Medication Sig   acetaminophen  (TYLENOL ) 500 MG tablet Take 2 tablets (1,000 mg total) by mouth every 8 (eight) hours as needed for moderate pain.   albuterol  (PROVENTIL ) (2.5 MG/3ML) 0.083% nebulizer solution Take 3 mLs (2.5 mg total) by nebulization every 2 (two) hours as needed for wheezing or shortness of breath.   albuterol  (VENTOLIN  HFA) 108 (90 Base) MCG/ACT inhaler INHALE 2 PUFFS INTO THE LUNGS EVERY 6 (SIX) HOURS AS NEEDED FOR WHEEZING OR SHORTNESS OF BREATH.   aspirin  EC 81 MG tablet Take 81 mg by mouth daily. Swallow whole.   atorvastatin  (LIPITOR ) 80 MG tablet Take 1 tablet (80 mg total) by mouth daily.   budesonide -glycopyrrolate -formoterol  (BREZTRI  AEROSPHERE) 160-9-4.8 MCG/ACT AERO inhaler Take 2 puffs first thing in morning and then another 2 puffs about 12 hours later.   DULoxetine  (CYMBALTA ) 60 MG capsule Take 1 capsule (60 mg total) by mouth daily. For chronic leg pains   ergocalciferol  (DRISDOL ) 1.25 MG (50000 UT) capsule Take 1 capsule (50,000 Units total) by mouth once a week.   ezetimibe  (ZETIA ) 10 MG tablet Take 1 tablet (10 mg total) by mouth daily.   ferrous sulfate  (FEROSUL) 325 (65 FE) MG tablet Take 1 tablet (325 mg total) by mouth 2 (two) times daily with a meal.   folic acid  (FOLVITE ) 1 MG tablet Take 1 tablet (1 mg total) by mouth daily.   furosemide  (LASIX ) 40 MG tablet Take 1 tablet (40 mg total) by mouth 2 (two) times daily.   gabapentin  (NEURONTIN ) 300 MG capsule Take 1 capsule (300 mg total) by mouth at bedtime. For leg pains   hydrALAZINE  (APRESOLINE ) 25 MG tablet Take 1 tablet (25 mg total) by mouth every  8 (eight) hours.   isosorbide  mononitrate (IMDUR ) 30 MG 24 hr tablet Take 1 tablet (30 mg total) by mouth daily.   metoprolol  succinate (TOPROL -XL) 50 MG 24 hr tablet Take 1 tablet (50 mg total) by mouth daily.   Multiple Vitamin (MULTIVITAMIN WITH MINERALS) TABS tablet Take 1 tablet by mouth daily.   nitroGLYCERIN  (NITROSTAT ) 0.4 MG SL tablet Place 0.4 mg under the tongue every 5 (five) minutes as needed for chest pain.   OXYGEN  Inhale 2 L/min into the lungs continuous. (Patient taking differently: Inhale 3 L/min into the lungs continuous.)   pantoprazole  (PROTONIX ) 40 MG tablet Take 1 tablet (40 mg total) by mouth daily.   rivaroxaban  (XARELTO ) 20 MG TABS tablet Take 1 tablet (20 mg total) by mouth daily with supper.     Family His   Family History  Problem Relation Age of Onset   Heart disease Mother    Diabetes Mother    Colon cancer Mother  Liver cancer Mother    Cancer Father        type unknown   Diabetes Sister        x 2   Diabetes Brother     Social History   Social History   Tobacco Use   Smoking status: Every Day    Current packs/day: 0.50    Average packs/day: 0.5 packs/day for 47.0 years (23.5 ttl pk-yrs)    Types: Cigarettes   Smokeless tobacco: Never   Tobacco comments:    4/5 cigs  Vaping Use   Vaping status: Never Used  Substance Use Topics   Alcohol use: Not Currently    Alcohol/week: 0.0 standard drinks of alcohol    Comment: last drink was before xmas   Drug use: No    Types: Cocaine, Marijuana    Comment: nothing in 20 years   Rahmel reports that he has been smoking cigarettes. He has a 23.5 pack-year smoking history. He has never used smokeless tobacco. He reports that he does not currently use alcohol. He reports that he does not use drugs.  Vital Signs and Physical Examination   Vitals:   12/20/23 1024  BP: (!) 98/48  Pulse: 86  SpO2: (!) 89%   Body mass index is 24.62 kg/m. Weight: 197 lb (89.4 kg)  General: Elderly gentleman  on oxygen  in no distress Head: Normocephalic and atraumatic Eyes: Sclerae anicteric, EOMI Lungs: Clear throughout to auscultation Heart: Regular rate and rhythm; No murmurs, rubs or bruits Abdomen: Soft, non tender and non distended. No masses, hepatosplenomegaly or hernias noted. Normal Bowel sounds Rectal: Deferred Musculoskeletal: Symmetrical with no gross deformities  Pulses:  Normal pulses noted Extremities: No edema or deformities noted Neurological: Alert oriented x 4, grossly nonfocal Psychological:  Alert and cooperative. Normal mood and affect   Review of Data   The following data was reviewed at the time of this encounter:   Laboratory Studies      Latest Ref Rng & Units 12/06/2023    2:56 PM 12/06/2023    1:56 PM 11/19/2023    8:47 AM  CBC  WBC 4.0 - 10.5 K/uL 7.3  6.9  6.6   Hemoglobin 13.0 - 17.0 g/dL 6.7  7.0 Repeated and verified X2.  8.1   Hematocrit 39.0 - 52.0 % 24.6  23.9 Repeated and verified X2.  30.8   Platelets 150 - 400 K/uL 295  293.0  470     Lab Results  Component Value Date   LIPASE 41 09/20/2021      Latest Ref Rng & Units 11/19/2023    8:44 AM 11/13/2023    5:10 PM 08/26/2023    6:15 AM  CMP  Glucose 70 - 99 mg/dL 886  808  896   BUN 8 - 23 mg/dL 12  12  13    Creatinine 0.61 - 1.24 mg/dL 9.19  9.14  9.24   Sodium 135 - 145 mmol/L 142  140  140   Potassium 3.5 - 5.1 mmol/L 3.7  3.5  4.5   Chloride 98 - 111 mmol/L 97  96  101   CO2 22 - 32 mmol/L  35  35   Calcium  8.9 - 10.3 mg/dL  8.5  8.6     Lab Results  Component Value Date   IRON  17 (L) 12/06/2023   TIBC 364 12/06/2023   FERRITIN 24 12/06/2023   04/2023 PT 13.7 INR 1.0  Imaging Studies  CTAP 08/23/2023 1. Previous appendectomy. Drain has been  removed. There is no longer any inflammatory change seen in the region. The appearance is satisfactory. 2. 3 mm nonobstructing stone in the midportion of the left kidney. 3. Aortic atherosclerosis. 4. Chronic lower lumbar  degenerative changes.  CTAP 05/20/2023 Changes consistent with prior appendectomy. Persistent inflammatory changes are seen adjacent to the cecum. No abscess or extravasation is noted.   Repositioning of the drainage catheter right hemipelvis with decompression of previously seen residual air-fluid collection.   No new focal abnormality is noted.  CTAP 05/13/2023 1. Interval decrease in volume of crescent shaped fluid collection within the posterior pelvis, anterior to the rectum. The percutaneous, pigtail drainage catheter placed on 05/05/2023 is noted along the right inferior margin of this fluid collection. 2. Slight decrease size of tubular shaped fluid collection extending along the right pelvic sidewall measuring 2.4 x 1.7 by 5.8 cm. 3. Persistent increased caliber of the small bowel loops is again noted which appears stable to improved in the interval, likely reflecting ileus. 4. Small right pleural effusion. 5.  Aortic Atherosclerosis (ICD10-I70.0).    GI Procedures and Studies  Single balloon enteroscopy 11/19/2023 - Esophageal mucosal changes consistent with short-segment Barrett's esophagus.  - Normal stomach.  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 Treated with argon plasma coagulation (APC).  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 Treated with argon plasma coagulation (APC). - -  A tattoo was seen in the jejunum.  - A small, superficial, mucosal disruption was seen in the fourth portion of the duodenum. Suspect this is due to the balloon apparatus. Clip (MR conditional) was placed. Clip manufacturer: [employer].  Video capsule endoscopy 09/30/2023   Small bowel enteroscopy 08/24/2023 - Normal upper third of esophagus and middle third of esophagus.  - Z-line irregular -appearance suspicious for Barrett's esophagus and similar to prior endoscopic evaluations.  - Normal gastric body, antrum, cardia and gastric fundus.  - Two non-bleeding  angioectasias in the duodenum. Treated with argon plasma coagulation (APC).  - Normal second portion of the duodenum, third portion of the duodenum and fourth portion of the duodenum. No evidence of active bleeding or stigmata of recent bleeding in the duodenum.  - A tattoo was seen in the jejunum placed during previous enteroscopy 05/2003.  - A single non-bleeding angioectasia in the jejunum located 10 to 15 cm distal to tattoo.. Treated with argon plasma coagulation (APC). No evidence of active bleeding or stigmata of recent bleeding in the jejunum.  Small bowel enteroscopy 05/20/2023 - Normal esophagus, stomach and duodenum - A single non-bleeding angioectasia in the jejunum. Treated with argon plasma coagulation (APC). Injected.  Upper endoscopy 12/27/2022 - Z-line irregular, 38 cm from incisors - Endo Clip found in gastric cardia - Normal duodenum  Small bowel enteroscopy 03/22/2022 - Z-line irregular.  - Normal esophagus  - Hemostasis clip x 2 were found in the stomach.  - Normal stomach otherwise  - Two non-bleeding angiodysplastic lesions in the duodenum. Treated with argon plasma coagulation (APC).  - A single non-bleeding angiodysplastic lesion in the duodenum. Treated with argon plasma coagulation (APC).  - A tattoo was seen in the jejunum. - Normal small bowel otherwise.  Upper endoscopy 11/19/2020 - Z-line irregular, 44 cm from incisors - 3 previously placed endoclips in the gastric cardia/fundus without evidence of rebleeding or stigmata of recent bleeding - Normal duodenum  Small bowel enteroscopy 11/12/2020 - No gross lesions in esophagus proximally. Salmon-colored mucosa noted in distal esohagus - not biopsied due to concern for possible underlying  varices.  - Hematin (altered blood/coffee-ground-like material) in the entire stomach - lavaged.  - 2 gastric ulcers as described above with a nonbleeding visible vessel (Forrest Class IIa).  - Gastritis. Biopsied.  - Blood in  the entire examined duodenum - lavaged. - No gross lesions in the entire examined duodenum.  - Jejunal blood - lavaged . - No gross lesions in the entire examined proximal jejunum. Tattooed distal extent  Path: Chronic active gastritis with H. pylori and focal intestinal metaplasia  Small bowel enteroscopy 10/09/2020 - Normal esophagus, stomach, duodenum and jejunum  Video capsule endoscopy 06/18/2019 - Complete VCE with adequate prep complete VCE with adequate prep - Gastric erythema/gastritis - No active bleeding or AVMs visualized  Small bowel enteroscopy 10/28/2018 - Normal esophagus.  - Normal stomach.  - Single AVM located 100 cm from the incisors. Treated with APC  Small bowel enteroscopy 09/28/2018 - Normal esophagus, stomach, duodenum and jejunum without angioectasias or other pathologic lesions  Colonoscopy 09/06/2018 - Complete colonoscopy with good bowel prep - Normal colon and TI (intubated 10 cm) - Internal hemorrhoids  Upper endoscopy 09/05/2018 - 2 cm HH - Mild esophagitis - Otherwise normal without cause for severe anemia  Clinical Impression  It is my clinical impression that Jared [name] is a 68 y.o. 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 pylori gastritis 11/2020; breath test negative 09/2021 and 12/2021 History of gastric intestinal metaplasia on gastric biopsy 11/2020 GERD Family history of colorectal cancer in a first-degree relative - mother  Jared [name] returns to the office today primarily to follow-up on his history of IDA and chronic blood loss from small bowel angioectasias.  Over the last 5 years, Jared [name] has undergone numerous endoscopic procedures - EGD, SBE, VCE that have documented small bowel angioectasias believed to be the etiology of his chronic anemia in the setting of anticoagulation.  Most recently he underwent single balloon enteroscopy 11/2023 with APC of duodenal and  jejunal angioectasias.  Despite this, he has demonstrated recurrent anemia requiring blood transfusion and iron  infusion.    At his hematology visit in June 2025, it was recommended that he initiate bevacizumab  infusions as a means of managing his small bowel bleeding which is certainly reasonable given the refractory nature of his anemia.  He is scheduled to start infusions this week.  There was also comment of consideration of Sandostatin presumably if bevacizumab  is not effective.  We discussed the possibility of referral to [facility] for double-balloon enteroscopy.  Jared [name] indicated that he would consider this if further attempts at medical management with bevacizumab  or Sandostatin in the future are ineffective.  He wishes to hold off on referral today to see how he responds to bevacizumab  infusions which is reasonable.  We also discussed his family history of colorectal cancer.  He believes that his mother had metastatic colorectal cancer diagnosed after the age of 89.  His last colonoscopy was performed in 2020 and was normal.  He is due for colorectal cancer screening.  Discussed that performing a colonoscopy would also permit the exclusion of lesions contributing to ongoing iron  deficiency anemia such as colonic angioectasias.  Advised that I would review this with Dr. Legrand who has been managing his care in the long-term.  Colonoscopy would need to be scheduled at a hospital-based unit given Mr. Helf's medical comorbidities and baseline oxygen  requirement.  This would also need to be coordinated at  a time that he is stable from an anemia perspective.  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  This was in the setting of H. pylori which was subsequently treated.  Gastric intestinal metaplasia may have resolved status post H. pylori treatment.  He has not had follow-up biopsies for gastric intestinal metaplasia mapping as most of his procedures have been done in the setting  of concern for bleeding.  If screening colonoscopy is coordinated in the future, could consider EGD at the same time with gastric intestinal metaplasia mapping biopsies.  Plan  Proceed with bevacizumab  infusions as coordinated by hematology oncology this week; per hematology notes, Sandostatin may be another consideration if he does not respond to bevacizumab  Reviewed possibility of referral to [facility] for double-balloon enteroscopy if medical management is ineffective in the future.  Will assess his response to bevacizumab  and can reevaluate this again if needed. Consider scheduling screening colonoscopy in a hospital-based unit given family history of colorectal cancer in [facility] Consider EGD at the same time as colonoscopy for gastric intestinal metaplasia mapping biopsies. Continue pantoprazole  40 mg orally daily for management of GERD GERD diet and lifestyle modification for reflux management  Planned Follow Up 3 months with Dr. Legrand or APP  The patient or caregiver verbalized understanding of the material covered, with no barriers to understanding. All questions were answered. Patient or caregiver is agreeable with the plan outlined above.    It was a pleasure to see Jared [name].  If you have any questions or concerns regarding this evaluation, do not hesitate to contact me.  [name] Hausen, MD [facility]   I spent total of 40  minutes in both face-to-face (20 minutes interview)  and non-face-to-face (20 minutes chart review, care coordination, documentation) activities, excluding procedures performed, for the visit on the date of this encounter.

## 2023-12-19 NOTE — Progress Notes (Signed)
 Diagnosis: Iron  Deficiency Anemia  Provider:  Praveen Mannam MD  Procedure: IV Infusion  IV Type: Peripheral, IV Location: L Hand  Venofer  (Iron  Sucrose), Dose: 300 mg  Infusion Start Time: 1348  Infusion Stop Time: 1528  Post Infusion IV Care: Patient declined observation and Peripheral IV Discontinued  Discharge: Condition: Good, Destination: Home . AVS Provided  Performed by:  Maximiano JONELLE Pouch, LPN

## 2023-12-20 ENCOUNTER — Encounter: Payer: Self-pay | Admitting: Pediatrics

## 2023-12-20 ENCOUNTER — Ambulatory Visit (INDEPENDENT_AMBULATORY_CARE_PROVIDER_SITE_OTHER): Admitting: Pediatrics

## 2023-12-20 VITALS — BP 98/48 | HR 86 | Ht 75.0 in | Wt 197.0 lb

## 2023-12-20 DIAGNOSIS — Z800 Family history of malignant neoplasm of digestive organs: Secondary | ICD-10-CM | POA: Diagnosis not present

## 2023-12-20 DIAGNOSIS — D500 Iron deficiency anemia secondary to blood loss (chronic): Secondary | ICD-10-CM

## 2023-12-20 DIAGNOSIS — K31819 Angiodysplasia of stomach and duodenum without bleeding: Secondary | ICD-10-CM

## 2023-12-20 DIAGNOSIS — K219 Gastro-esophageal reflux disease without esophagitis: Secondary | ICD-10-CM

## 2023-12-20 DIAGNOSIS — Z8719 Personal history of other diseases of the digestive system: Secondary | ICD-10-CM

## 2023-12-20 DIAGNOSIS — D509 Iron deficiency anemia, unspecified: Secondary | ICD-10-CM

## 2023-12-20 DIAGNOSIS — Z8711 Personal history of peptic ulcer disease: Secondary | ICD-10-CM

## 2023-12-20 NOTE — Patient Instructions (Signed)
 Follow up with Dr. Legrand in 3 months.  Thank you for entrusting me with your care and for choosing [facility], Dr. [name] Hausen  _______________________________________________________  If your blood pressure at your visit was 140/90 or greater, please contact your primary care physician to follow up on this.  _______________________________________________________  If you are age 70 or older, your body mass index should be between 23-30. Your Body mass index is 24.62 kg/m. If this is out of the aforementioned range listed, please consider follow up with your Primary Care Provider.  If you are age 43 or younger, your body mass index should be between 19-25. Your Body mass index is 24.62 kg/m. If this is out of the aformentioned range listed, please consider follow up with your Primary Care Provider.   ________________________________________________________  The [facility] providers would like to encourage you to use MYCHART to communicate with providers for non-urgent requests or questions.  Due to long hold times on the telephone, sending your provider a message by [facility] may be a faster and more efficient way to get a response.  Please allow 48 business hours for a response.  Please remember that this is for non-urgent requests.  _______________________________________________________

## 2023-12-23 ENCOUNTER — Encounter: Payer: Self-pay | Admitting: Physician Assistant

## 2023-12-24 ENCOUNTER — Inpatient Hospital Stay: Attending: Nurse Practitioner

## 2023-12-24 DIAGNOSIS — K2970 Gastritis, unspecified, without bleeding: Secondary | ICD-10-CM | POA: Insufficient documentation

## 2023-12-24 DIAGNOSIS — K274 Chronic or unspecified peptic ulcer, site unspecified, with hemorrhage: Secondary | ICD-10-CM | POA: Insufficient documentation

## 2023-12-24 DIAGNOSIS — D500 Iron deficiency anemia secondary to blood loss (chronic): Secondary | ICD-10-CM | POA: Insufficient documentation

## 2023-12-24 DIAGNOSIS — Z5112 Encounter for antineoplastic immunotherapy: Secondary | ICD-10-CM | POA: Insufficient documentation

## 2023-12-24 DIAGNOSIS — K31819 Angiodysplasia of stomach and duodenum without bleeding: Secondary | ICD-10-CM

## 2023-12-25 ENCOUNTER — Other Ambulatory Visit: Payer: Self-pay | Admitting: Nurse Practitioner

## 2023-12-25 DIAGNOSIS — D500 Iron deficiency anemia secondary to blood loss (chronic): Secondary | ICD-10-CM

## 2023-12-25 NOTE — Progress Notes (Signed)
 Patient Care Team: Delbert Clam, MD as PCP - General (Family Medicine) Okey Vina GAILS, MD as PCP - Cardiology (Cardiology) [facility] Burton, Lacie K, NP as Nurse Practitioner (Hematology and Oncology) Joya Ade, DPM as Consulting Physician (Podiatry)  Clinic Day:  12/26/2023  Referring physician: Delbert Clam, MD  ASSESSMENT & PLAN:   Assessment & Plan: Iron  deficiency anemia due to chronic blood loss secondary to h/o GI bleed, PUD, and inadequate iron  replacement -First found to have iron  deficiency 09/05/2018 with serum iron  9, 3% saturation, TIBC 358, and ferritin of 3 with a hemoglobin of 4.9, s/p blood transfusion  -EGD showed esophagitis, colonoscopy and small bowel endoscopy were unremarkable. Anemia improved -Developed acute on chronic anemia 10/2018, small bowel endoscopy  -Began receiving IV iron  at that time (Feraheme  510 mg x1 episodically), and been on iv venofer  lately, responded well  -Capsule endoscopy 06/18/19 showed gastritis, no AVMs or bleeding.  -continue iv iron  as needed, if ferrintin<30 - Currently getting IV iron  weekly. -12/26/2023 -cycle 1 bevacizumab  every 14 days.  Will get IV Venofer  300 mg along with treatment.  Continue IV iron  was [facility] infusion center on alternate weeks.    Chronic GI bleed Patient is starting IV bevacizumab  today.  He has been getting frequent blood transfusions and iron  infusions.  Improving CBC with Hgb 8.0 and HCT 20.9.  Iron  elevated at 268, sat ratio 79%, TIBC 342 and transferrin 74. He will get IV Venofer  300 mg during today's treatment.  Ferritin today is 44.  Since ferritin has gone <30, we will hold IV Venofer  at [facility] infusion center.  We will arrange for IV Venofer  at [facility] infusion center every other week, if ferritin falls to <30 again. Will continue IV Venofer  with bevacizumab  during visits to cancer center every 14 days.  Will base need for IV Venofer  on ferritin from  prior lab check.  Plan Reviewed labs. - Improved CBC with Hgb 8.0 and HCT 28.9. - Ferritin 44. - Iron  is 268, TIBC 342, sat ratio 79%, and transferrin is 74. Patient and labs are appropriate for treatment today with cycle 1 of bevacizumab  and IV Venofer  300 mg.  The patient understands the plans discussed today and is in agreement with them.  He knows to contact our office if he develops concerns prior to his next appointment.  I provided 25 minutes of face-to-face time during this encounter and > 50% was spent counseling as documented under my assessment and plan.    Powell FORBES Lessen, NP   CANCER CENTER [facility] - A DEPT OF [facility] [address] [place] [address] Dept: [phone] Dept Fax: [phone]   No orders of the defined types were placed in this encounter.     CHIEF COMPLAINT:  CC: Iron  deficiency anemia due to chronic blood loss  Current Treatment: IV iron   INTERVAL HISTORY:  Jared [name] is here today for repeat clinical assessment.  His most recent telephone visit was with Dr. Lanny on 12/06/2023.  The patient has had multiple blood transfusions and iron  infusions.  Hemoglobin continues to be low.  Concern for abnormal blood vessels causing bleeding.  Treatment with Sandostatin or bevacizumab  was discussed with him.  Today, he will start treatment with IV bevacizumab  every 14 days.  Will schedule for additional IV iron  at [facility] infusion center if ferritin <30.  The patient reports feeling fatigued, but otherwise doing  well.  He denies chest pain, chest pressure, or shortness of breath. He denies headaches or visual disturbances. He denies abdominal pain, nausea, vomiting, or changes in bowel or bladder habits.  He has noted no recent signs of bleeding such as blood in the stool, hematemesis, hemoptysis, or epistaxis.  He denies fevers or chills. He denies pain. His appetite is good. His weight has been  stable.  I have reviewed the past medical history, past surgical history, social history and family history with the patient and they are unchanged from previous note.  ALLERGIES:  is allergic to lisinopril .  MEDICATIONS:  Current Outpatient Medications  Medication Sig Dispense Refill   acetaminophen  (TYLENOL ) 500 MG tablet Take 2 tablets (1,000 mg total) by mouth every 8 (eight) hours as needed for moderate pain. 100 tablet 1   albuterol  (PROVENTIL ) (2.5 MG/3ML) 0.083% nebulizer solution Take 3 mLs (2.5 mg total) by nebulization every 2 (two) hours as needed for wheezing or shortness of breath. 75 mL 1   albuterol  (VENTOLIN  HFA) 108 (90 Base) MCG/ACT inhaler INHALE 2 PUFFS INTO THE LUNGS EVERY 6 (SIX) HOURS AS NEEDED FOR WHEEZING OR SHORTNESS OF BREATH. 18 g 6   aspirin  EC 81 MG tablet Take 81 mg by mouth daily. Swallow whole.     atorvastatin  (LIPITOR ) 80 MG tablet Take 1 tablet (80 mg total) by mouth daily. 90 tablet 1   budesonide -glycopyrrolate -formoterol  (BREZTRI  AEROSPHERE) 160-9-4.8 MCG/ACT AERO inhaler Take 2 puffs first thing in morning and then another 2 puffs about 12 hours later. 10.7 g 11   DULoxetine  (CYMBALTA ) 60 MG capsule Take 1 capsule (60 mg total) by mouth daily. For chronic leg pains 90 capsule 1   ergocalciferol  (DRISDOL ) 1.25 MG (50000 UT) capsule Take 1 capsule (50,000 Units total) by mouth once a week. 12 capsule 1   ezetimibe  (ZETIA ) 10 MG tablet Take 1 tablet (10 mg total) by mouth daily. 90 tablet 2   ferrous sulfate  (FEROSUL) 325 (65 FE) MG tablet Take 1 tablet (325 mg total) by mouth 2 (two) times daily with a meal. 180 tablet 1   folic acid  (FOLVITE ) 1 MG tablet Take 1 tablet (1 mg total) by mouth daily. 90 tablet 1   furosemide  (LASIX ) 40 MG tablet Take 1 tablet (40 mg total) by mouth 2 (two) times daily. 180 tablet 1   gabapentin  (NEURONTIN ) 300 MG capsule Take 1 capsule (300 mg total) by mouth at bedtime. For leg pains 90 capsule 1   hydrALAZINE  (APRESOLINE )  25 MG tablet Take 1 tablet (25 mg total) by mouth every 8 (eight) hours. 90 tablet 5   isosorbide  mononitrate (IMDUR ) 30 MG 24 hr tablet Take 1 tablet (30 mg total) by mouth daily. 90 tablet 1   metoprolol  succinate (TOPROL -XL) 50 MG 24 hr tablet Take 1 tablet (50 mg total) by mouth daily. 90 tablet 1   Multiple Vitamin (MULTIVITAMIN WITH MINERALS) TABS tablet Take 1 tablet by mouth daily.     nitroGLYCERIN  (NITROSTAT ) 0.4 MG SL tablet Place 0.4 mg under the tongue every 5 (five) minutes as needed for chest pain.     OXYGEN  Inhale 2 L/min into the lungs continuous. (Patient taking differently: Inhale 3 L/min into the lungs continuous.)     pantoprazole  (PROTONIX ) 40 MG tablet Take 1 tablet (40 mg total) by mouth daily. 90 tablet 1   rivaroxaban  (XARELTO ) 20 MG TABS tablet Take 1 tablet (20 mg total) by mouth daily with supper. 90 tablet 1   No  current facility-administered medications for this visit.    REVIEW OF SYSTEMS:   Constitutional: Denies fevers, chills or abnormal weight loss. Fatigue.  Eyes: Denies blurriness of vision Ears, nose, mouth, throat, and face: Denies mucositis or sore throat Respiratory: Denies cough, dyspnea or wheezes Cardiovascular: Denies palpitation, chest discomfort or lower extremity swelling Gastrointestinal:  Denies nausea, heartburn or change in bowel habits Skin: Denies abnormal skin rashes Lymphatics: Denies new lymphadenopathy or easy bruising Neurological:Denies numbness, tingling or new weaknesses Behavioral/Psych: Mood is stable, no new changes  All other systems were reviewed with the patient and are negative.   VITALS:   Today's Vitals   12/26/23 0800 12/26/23 0817  BP:  120/60  Pulse:  100  Resp:  17  Temp:  98.3 F (36.8 C)  SpO2:  93%  Weight:  196 lb 11.2 oz (89.2 kg)  PainSc: 0-No pain    Body mass index is 24.59 kg/m.   Wt Readings from Last 3 Encounters:  12/26/23 196 lb 11.2 oz (89.2 kg)  12/20/23 197 lb (89.4 kg)  12/19/23  197 lb (89.4 kg)    Body mass index is 24.59 kg/m.  Performance status (ECOG): 1 - Symptomatic but completely ambulatory  PHYSICAL EXAM:   GENERAL:alert, no distress and comfortable SKIN: skin color, texture, turgor are normal, no rashes or significant lesions EYES: normal, Conjunctiva are pink and non-injected, sclera clear OROPHARYNX:no exudate, no erythema and lips, buccal mucosa, and tongue normal  NECK: supple, thyroid  normal size, non-tender, without nodularity LYMPH:  no palpable lymphadenopathy in the cervical, axillary or inguinal LUNGS: clear to auscultation and percussion with normal breathing effort HEART: regular rate & rhythm and no murmurs and no lower extremity edema ABDOMEN:abdomen soft, non-tender and normal bowel sounds Musculoskeletal:no cyanosis of digits and no clubbing  NEURO: alert & oriented x 3 with fluent speech, no focal motor/sensory deficits  LABORATORY DATA:  I have reviewed the data as listed    Component Value Date/Time   NA 142 11/19/2023 0844   NA 142 06/10/2023 1012   K 3.7 11/19/2023 0844   CL 97 (L) 11/19/2023 0844   CO2 35 (H) 11/13/2023 1710   GLUCOSE 113 (H) 11/19/2023 0844   BUN 12 11/19/2023 0844   BUN 13 06/10/2023 1012   CREATININE 0.80 11/19/2023 0844   CREATININE 1.20 07/30/2016 1431   CALCIUM  8.5 (L) 11/13/2023 1710   PROT 6.5 08/22/2023 1148   PROT 6.7 10/25/2022 1027   ALBUMIN  3.2 (L) 08/22/2023 1148   ALBUMIN  4.4 10/25/2022 1027   AST 19 08/22/2023 1148   ALT 22 08/22/2023 1148   ALKPHOS 64 08/22/2023 1148   BILITOT 0.3 08/22/2023 1148   BILITOT 0.3 10/25/2022 1027   GFRNONAA >60 11/13/2023 1710   GFRNONAA 64 07/30/2016 1431   GFRAA 91 01/29/2020 1020   GFRAA 74 07/30/2016 1431    Lab Results  Component Value Date   WBC 6.6 12/26/2023   NEUTROABS 5.3 12/26/2023   HGB 8.0 (L) 12/26/2023   HCT 28.9 (L) 12/26/2023   MCV 91.7 12/26/2023   PLT 270 12/26/2023    Iron /TIBC/Ferritin/ %Sat    Component Value  Date/Time   IRON  268 (H) 12/26/2023 0747   IRON  355 (HH) 01/26/2021 0852   TIBC 342 12/26/2023 0747   TIBC <372 01/26/2021 0852   FERRITIN 44 12/26/2023 0750   FERRITIN 26 (L) 04/06/2020 0821   IRONPCTSAT 79 (H) 12/26/2023 0747   IRONPCTSAT >95 (HH) 01/26/2021 9147

## 2023-12-25 NOTE — Assessment & Plan Note (Addendum)
 Secondary to h/o GI bleed, PUD, and inadequate iron  replacement -First found to have iron  deficiency 09/05/2018 with serum iron  9, 3% saturation, TIBC 358, and ferritin of 3 with a hemoglobin of 4.9, s/p blood transfusion  -EGD showed esophagitis, colonoscopy and small bowel endoscopy were unremarkable. Anemia improved -Developed acute on chronic anemia 10/2018, small bowel endoscopy  -Began receiving IV iron  at that time (Feraheme  510 mg x1 episodically), and been on iv venofer  lately, responded well  -Capsule endoscopy 06/18/19 showed gastritis, no AVMs or bleeding.  - Currently getting IV iron  weekly. -12/26/2023 -cycle 1 bevacizumab  every 14 days.  Will get IV Venofer  300 mg along with treatment.  Continue IV iron  at [address] infusion center on alternate weeks as needed for ferritin <30.

## 2023-12-25 NOTE — Telephone Encounter (Signed)
 Thanks again, [name] Nest, This patient will be seeing you in early August.  Please link in his chart (typically using the virtual sticky note function) that he will need a hospital-based outpatient colonoscopy with me for family history colon cancer  H Danis

## 2023-12-26 ENCOUNTER — Inpatient Hospital Stay

## 2023-12-26 ENCOUNTER — Encounter: Payer: Self-pay | Admitting: Nurse Practitioner

## 2023-12-26 ENCOUNTER — Inpatient Hospital Stay (HOSPITAL_BASED_OUTPATIENT_CLINIC_OR_DEPARTMENT_OTHER): Admitting: Nurse Practitioner

## 2023-12-26 ENCOUNTER — Encounter: Payer: Self-pay | Admitting: Hematology

## 2023-12-26 VITALS — BP 120/60 | HR 100 | Temp 98.3°F | Resp 17 | Wt 196.7 lb

## 2023-12-26 DIAGNOSIS — K2970 Gastritis, unspecified, without bleeding: Secondary | ICD-10-CM | POA: Diagnosis not present

## 2023-12-26 DIAGNOSIS — D500 Iron deficiency anemia secondary to blood loss (chronic): Secondary | ICD-10-CM

## 2023-12-26 DIAGNOSIS — D649 Anemia, unspecified: Secondary | ICD-10-CM

## 2023-12-26 DIAGNOSIS — K31819 Angiodysplasia of stomach and duodenum without bleeding: Secondary | ICD-10-CM

## 2023-12-26 DIAGNOSIS — K274 Chronic or unspecified peptic ulcer, site unspecified, with hemorrhage: Secondary | ICD-10-CM | POA: Diagnosis not present

## 2023-12-26 DIAGNOSIS — Z5112 Encounter for antineoplastic immunotherapy: Secondary | ICD-10-CM | POA: Diagnosis not present

## 2023-12-26 LAB — IRON AND IRON BINDING CAPACITY (CC-WL,HP ONLY)
Iron: 268 ug/dL — ABNORMAL HIGH (ref 45–182)
Saturation Ratios: 79 % — ABNORMAL HIGH (ref 17.9–39.5)
TIBC: 342 ug/dL (ref 250–450)
UIBC: 74 ug/dL — ABNORMAL LOW (ref 117–376)

## 2023-12-26 LAB — CBC WITH DIFFERENTIAL (CANCER CENTER ONLY)
Abs Immature Granulocytes: 0.03 K/uL (ref 0.00–0.07)
Basophils Absolute: 0 K/uL (ref 0.0–0.1)
Basophils Relative: 0 %
Eosinophils Absolute: 0.1 K/uL (ref 0.0–0.5)
Eosinophils Relative: 1 %
HCT: 28.9 % — ABNORMAL LOW (ref 39.0–52.0)
Hemoglobin: 8 g/dL — ABNORMAL LOW (ref 13.0–17.0)
Immature Granulocytes: 1 %
Lymphocytes Relative: 10 %
Lymphs Abs: 0.7 K/uL (ref 0.7–4.0)
MCH: 25.4 pg — ABNORMAL LOW (ref 26.0–34.0)
MCHC: 27.7 g/dL — ABNORMAL LOW (ref 30.0–36.0)
MCV: 91.7 fL (ref 80.0–100.0)
Monocytes Absolute: 0.5 K/uL (ref 0.1–1.0)
Monocytes Relative: 8 %
Neutro Abs: 5.3 K/uL (ref 1.7–7.7)
Neutrophils Relative %: 80 %
Platelet Count: 270 K/uL (ref 150–400)
RBC: 3.15 MIL/uL — ABNORMAL LOW (ref 4.22–5.81)
RDW: 16.9 % — ABNORMAL HIGH (ref 11.5–15.5)
WBC Count: 6.6 K/uL (ref 4.0–10.5)
nRBC: 0 % (ref 0.0–0.2)

## 2023-12-26 LAB — SAMPLE TO BLOOD BANK

## 2023-12-26 LAB — FERRITIN: Ferritin: 44 ng/mL (ref 24–336)

## 2023-12-26 LAB — TOTAL PROTEIN, URINE DIPSTICK: Protein, ur: NEGATIVE mg/dL

## 2023-12-26 MED ORDER — SODIUM CHLORIDE 0.9 % IV SOLN
300.0000 mg | Freq: Once | INTRAVENOUS | Status: AC
  Administered 2023-12-26: 300 mg via INTRAVENOUS
  Filled 2023-12-26: qty 300

## 2023-12-26 MED ORDER — SODIUM CHLORIDE 0.9 % IV SOLN: INTRAVENOUS | Status: DC

## 2023-12-26 MED ORDER — SODIUM CHLORIDE 0.9 % IV SOLN
INTRAVENOUS | Status: DC
Start: 2023-12-26 — End: 2023-12-26

## 2023-12-26 MED ORDER — SODIUM CHLORIDE 0.9 % IV SOLN
5.0000 mg/kg | Freq: Once | INTRAVENOUS | Status: AC
  Administered 2023-12-26: 450 mg via INTRAVENOUS
  Filled 2023-12-26: qty 2

## 2023-12-29 ENCOUNTER — Encounter: Payer: Self-pay | Admitting: Nurse Practitioner

## 2023-12-29 ENCOUNTER — Encounter: Payer: Self-pay | Admitting: Hematology

## 2023-12-30 ENCOUNTER — Encounter: Payer: Self-pay | Admitting: Hematology

## 2023-12-30 ENCOUNTER — Other Ambulatory Visit: Payer: Self-pay

## 2023-12-30 ENCOUNTER — Encounter: Payer: Self-pay | Admitting: Nurse Practitioner

## 2024-01-07 ENCOUNTER — Telehealth: Payer: Self-pay | Admitting: Hematology

## 2024-01-07 NOTE — Telephone Encounter (Signed)
 Called and talked with the patient about his upcoming appointments. Patient was given clarity and he is aware of the scheduled appointments.

## 2024-01-10 ENCOUNTER — Other Ambulatory Visit: Payer: Self-pay | Admitting: Nurse Practitioner

## 2024-01-10 ENCOUNTER — Inpatient Hospital Stay

## 2024-01-10 ENCOUNTER — Inpatient Hospital Stay: Attending: Nurse Practitioner

## 2024-01-10 ENCOUNTER — Inpatient Hospital Stay (HOSPITAL_BASED_OUTPATIENT_CLINIC_OR_DEPARTMENT_OTHER): Admitting: Nurse Practitioner

## 2024-01-10 VITALS — BP 124/60 | HR 82 | Temp 98.3°F | Resp 16 | Ht 75.0 in | Wt 204.9 lb

## 2024-01-10 VITALS — BP 148/78 | HR 67 | Temp 97.8°F | Resp 18

## 2024-01-10 DIAGNOSIS — Z5112 Encounter for antineoplastic immunotherapy: Secondary | ICD-10-CM | POA: Diagnosis not present

## 2024-01-10 DIAGNOSIS — D649 Anemia, unspecified: Secondary | ICD-10-CM

## 2024-01-10 DIAGNOSIS — K274 Chronic or unspecified peptic ulcer, site unspecified, with hemorrhage: Secondary | ICD-10-CM | POA: Insufficient documentation

## 2024-01-10 DIAGNOSIS — K31819 Angiodysplasia of stomach and duodenum without bleeding: Secondary | ICD-10-CM

## 2024-01-10 DIAGNOSIS — D500 Iron deficiency anemia secondary to blood loss (chronic): Secondary | ICD-10-CM

## 2024-01-10 DIAGNOSIS — Z79899 Other long term (current) drug therapy: Secondary | ICD-10-CM | POA: Diagnosis not present

## 2024-01-10 LAB — CBC WITH DIFFERENTIAL (CANCER CENTER ONLY)
Abs Immature Granulocytes: 0.02 K/uL (ref 0.00–0.07)
Basophils Absolute: 0 K/uL (ref 0.0–0.1)
Basophils Relative: 1 %
Eosinophils Absolute: 0.1 K/uL (ref 0.0–0.5)
Eosinophils Relative: 2 %
HCT: 36.2 % — ABNORMAL LOW (ref 39.0–52.0)
Hemoglobin: 10.1 g/dL — ABNORMAL LOW (ref 13.0–17.0)
Immature Granulocytes: 0 %
Lymphocytes Relative: 20 %
Lymphs Abs: 1 K/uL (ref 0.7–4.0)
MCH: 25.6 pg — ABNORMAL LOW (ref 26.0–34.0)
MCHC: 27.9 g/dL — ABNORMAL LOW (ref 30.0–36.0)
MCV: 91.9 fL (ref 80.0–100.0)
Monocytes Absolute: 0.4 K/uL (ref 0.1–1.0)
Monocytes Relative: 7 %
Neutro Abs: 3.8 K/uL (ref 1.7–7.7)
Neutrophils Relative %: 70 %
Platelet Count: 259 K/uL (ref 150–400)
RBC: 3.94 MIL/uL — ABNORMAL LOW (ref 4.22–5.81)
RDW: 16 % — ABNORMAL HIGH (ref 11.5–15.5)
WBC Count: 5.3 K/uL (ref 4.0–10.5)
nRBC: 0 % (ref 0.0–0.2)

## 2024-01-10 LAB — IRON AND IRON BINDING CAPACITY (CC-WL,HP ONLY)
Iron: 38 ug/dL — ABNORMAL LOW (ref 45–182)
Saturation Ratios: 11 % — ABNORMAL LOW (ref 17.9–39.5)
TIBC: 337 ug/dL (ref 250–450)
UIBC: 299 ug/dL (ref 117–376)

## 2024-01-10 LAB — SAMPLE TO BLOOD BANK

## 2024-01-10 LAB — FERRITIN
Ferritin: 44 ng/mL (ref 24–336)
Ferritin: 44 ng/mL (ref 24–336)

## 2024-01-10 LAB — TOTAL PROTEIN, URINE DIPSTICK: Protein, ur: NEGATIVE mg/dL

## 2024-01-10 MED ORDER — SODIUM CHLORIDE 0.9 % IV SOLN
5.0000 mg/kg | Freq: Once | INTRAVENOUS | Status: AC
  Administered 2024-01-10: 450 mg via INTRAVENOUS
  Filled 2024-01-10: qty 4

## 2024-01-10 MED ORDER — SODIUM CHLORIDE 0.9 % IV SOLN: INTRAVENOUS | Status: DC

## 2024-01-10 MED ORDER — SODIUM CHLORIDE 0.9 % IV SOLN
300.0000 mg | Freq: Once | INTRAVENOUS | Status: AC
  Administered 2024-01-10: 300 mg via INTRAVENOUS
  Filled 2024-01-10: qty 200

## 2024-01-10 NOTE — Patient Instructions (Addendum)
 [facility] - A DEPT OF [facility]  Discharge Instructions: Thank you for choosing [facility] to provide your oncology and hematology care.   If you have a lab appointment with the Cancer Center, please go directly to the Cancer Center and check in at the registration area.   Wear comfortable clothing and clothing appropriate for easy access to any Portacath or PICC line.   We strive to give you quality time with your provider. You may need to reschedule your appointment if you arrive late (15 or more minutes).  Arriving late affects you and other patients whose appointments are after yours.  Also, if you miss three or more appointments without notifying the office, you may be dismissed from the clinic at the provider's discretion.      For prescription refill requests, have your pharmacy contact our office and allow 72 hours for refills to be completed.    Today you received the following immunotherapy agents Bevacizumab    To help prevent nausea and vomiting after your treatment, we encourage you to take your nausea medication as directed.  BELOW ARE SYMPTOMS THAT SHOULD BE REPORTED IMMEDIATELY: *FEVER GREATER THAN 100.4 F (38 C) OR HIGHER *CHILLS OR SWEATING *NAUSEA AND VOMITING THAT IS NOT CONTROLLED WITH YOUR NAUSEA MEDICATION *UNUSUAL SHORTNESS OF BREATH *UNUSUAL BRUISING OR BLEEDING *URINARY PROBLEMS (pain or burning when urinating, or frequent urination) *BOWEL PROBLEMS (unusual diarrhea, constipation, pain near the anus) TENDERNESS IN MOUTH AND THROAT WITH OR WITHOUT PRESENCE OF ULCERS (sore throat, sores in mouth, or a toothache) UNUSUAL RASH, SWELLING OR PAIN  UNUSUAL VAGINAL DISCHARGE OR ITCHING   Items with * indicate a potential emergency and should be followed up as soon as possible or go to the Emergency Department if any problems should occur.  Please show the CHEMOTHERAPY ALERT CARD or IMMUNOTHERAPY ALERT CARD at check-in  to the Emergency Department and triage nurse.  Should you have questions after your visit or need to cancel or reschedule your appointment, please contact [facility]  Dept: [phone]  and follow the prompts.  Office hours are 8:00 a.m. to 4:30 p.m. Monday - Friday. Please note that voicemails left after 4:00 p.m. may not be returned until the following business day.  We are closed weekends and major holidays. You have access to a nurse at all times for urgent questions. Please call the main number to the clinic Dept: [phone] and follow the prompts.   For any non-urgent questions, you may also contact your provider using MyChart. We now offer e-Visits for anyone 31 and older to request care online for non-urgent symptoms. For details visit [email or web address].   Also download the MyChart app! Go to the app store, search MyChart, open the app, select [place], and log in with your MyChart username and password.  Iron  Sucrose Injection What is this medication? IRON  SUCROSE (EYE ern SOO krose) treats low levels of iron  (iron  deficiency anemia) in people with kidney disease. Iron  is a mineral that plays an important role in making red blood cells, which carry oxygen  from your lungs to the rest of your body. This medicine may be used for other purposes; ask your health care provider or pharmacist if you have questions. COMMON BRAND NAME(S): Venofer  What should I tell my care team before I take this medication? They need to know if you have any of these  conditions: Anemia not caused by low iron  levels Heart disease High levels of iron  in the blood Kidney disease Liver disease An unusual or allergic reaction to iron , other medications, foods, dyes, or preservatives Pregnant or trying to get pregnant Breastfeeding How should I use this medication? This medication is infused into a vein. It is given by your care team in a  hospital or clinic setting. Talk to your care team about the use of this medication in children. While it may be prescribed for children as young as 2 years for selected conditions, precautions do apply. Overdosage: If you think you have taken too much of this medicine contact a poison control center or emergency room at once. NOTE: This medicine is only for you. Do not share this medicine with others. What if I miss a dose? Keep appointments for follow-up doses. It is important not to miss your dose. Call your care team if you are unable to keep an appointment. What may interact with this medication? Do not take this medication with any of the following: Deferoxamine Dimercaprol  Other iron  products This medication may also interact with the following: Chloramphenicol Deferasirox This list may not describe all possible interactions. Give your health care provider a list of all the medicines, herbs, non-prescription drugs, or dietary supplements you use. Also tell them if you smoke, drink alcohol, or use illegal drugs. Some items may interact with your medicine. What should I watch for while using this medication? Visit your care team for regular checks on your progress. Tell your care team if your symptoms do not start to get better or if they get worse. You may need blood work done while you are taking this medication. You may need to eat more foods that contain iron . Talk to your care team. Foods that contain iron  include whole grains or cereals, dried fruits, beans, peas, leafy green vegetables, and organ meats (liver, kidney). What side effects may I notice from receiving this medication? Side effects that you should report to your care team as soon as possible: Allergic reactions--skin rash, itching, hives, swelling of the face, lips, tongue, or throat Low blood pressure--dizziness, feeling faint or lightheaded, blurry vision Shortness of breath Side effects that usually do not require  medical attention (report to your care team if they continue or are bothersome): Flushing Headache Joint pain Muscle pain Nausea Pain, redness, or irritation at injection site This list may not describe all possible side effects. Call your doctor for medical advice about side effects. You may report side effects to FDA at 1-800-FDA-1088. Where should I keep my medication? This medication is given in a hospital or clinic. It will not be stored at home. NOTE: This sheet is a summary. It may not cover all possible information. If you have questions about this medicine, talk to your doctor, pharmacist, or health care provider.  2024 Elsevier/Gold Standard (2023-01-16 00:00:00)

## 2024-01-10 NOTE — Assessment & Plan Note (Addendum)
 Secondary to h/o GI bleed, PUD, and inadequate iron  replacement -First found to have iron  deficiency 09/05/2018 with serum iron  9, 3% saturation, TIBC 358, and ferritin of 3 with a hemoglobin of 4.9, s/p blood transfusion  -EGD showed esophagitis, colonoscopy and small bowel endoscopy were unremarkable. Anemia improved -Developed acute on chronic anemia 10/2018, small bowel endoscopy  -Began receiving IV iron  at that time (Feraheme  510 mg x1 episodically), and been on iv venofer  lately, responded well  -Capsule endoscopy 06/18/19 showed gastritis, no AVMs or bleeding.  - Currently getting IV iron  weekly. -12/26/2023 -cycle 1 bevacizumab  every 14 days.  Will get IV Venofer  300 mg along with treatment.  Continue IV iron  at [facility] center on alternate weeks as needed for ferritin <30. - 01/10/2024 -patient tolerated initial cycle of bevacizumab  very well without negative side effects.  No evidence of vaginal bleeding.  Hgb and HCT improved.  Ferritin steady with slight decrease in iron  studies.  Proceed with cycle #2 bevacizumab  IV Venofer  300 mg.  Continue treatment every 2 weeks.  Patient to continue receiving IV Venofer  300 mg on alternate weeks at [facility].

## 2024-01-10 NOTE — Progress Notes (Signed)
 Patient Care Team: Jared Clam, MD as PCP - General (Family Medicine) Jared Vina GAILS, MD as PCP - Cardiology (Cardiology) [facility] Jared [name], Jared K, NP as Nurse Practitioner (Hematology and Oncology) Jared [name], DPM as Consulting Physician (Podiatry)  Clinic Day:  01/10/2024  Referring physician: Lanny Callander, MD  ASSESSMENT & PLAN:   Assessment & Plan: Iron  deficiency anemia due to chronic blood loss Secondary to h/o GI bleed, PUD, and inadequate iron  replacement -First found to have iron  deficiency 09/05/2018 with serum iron  9, 3% saturation, TIBC 358, and ferritin of 3 with a hemoglobin of 4.9, s/p blood transfusion  -EGD showed esophagitis, colonoscopy and small bowel endoscopy were unremarkable. Anemia improved -Developed acute on chronic anemia 10/2018, small bowel endoscopy  -Began receiving IV iron  at that time (Feraheme  510 mg x1 episodically), and been on iv venofer  lately, responded well  -Capsule endoscopy 06/18/19 showed gastritis, no AVMs or bleeding.  - Currently getting IV iron  weekly. -12/26/2023 -cycle 1 bevacizumab  every 14 days.  Will get IV Venofer  300 mg along with treatment.  Continue IV iron  at [facility] on alternate weeks as needed for ferritin <30.    Iron  deficiency anemia Some improvement since initial treatment of bevacizumab  on 12/26/2023.  Hgb was 10.1 and HCT 36.2.  Ferritin steady at 44.  Iron  is 30, TIBC 337, sat ratio was 11%, transferrin 299.  Will receive cycle #2 of bevacizumab  today along with 300 mg Venofer .  Receive additional 300 mg IV Venofer  at [facility] in 1 week.  Plan Labs reviewed. - Improved Hgb 10.4 on and HCT 36.2. - Ferritin stable at 44, iron  30, TIBC 337, sat ratio 11%, transferrin 299. - Negative urine protein. Labs and patient presentation are appropriate for treatment today. Proceed with cycle #2 bevacizumab  along with 300 mg IV Venofer . Continue with labs,  bevacizumab , and IV Venofer  300 mg every 2 weeks at cancer center. On alternate weeks, patient to receive IV Venofer  300 mg at [facility].  The patient understands the plans discussed today and is in agreement with them.  He knows to contact our office if he develops concerns prior to his next appointment.  I provided 20 minutes of face-to-face time during this encounter and > 50% was spent counseling as documented under my assessment and plan.    Jared Jared [name] Lessen, NP  [facility] - A DEPT OF [facility] [address] [place] [address] Dept: [phone] Dept Fax: [phone]   No orders of the defined types were placed in this encounter.     CHIEF COMPLAINT:  CC: Iron  deficiency anemia due to chronic blood loss  Current Treatment: Bevacizumab  every 14 days, IV iron , and blood transfusions as needed.  INTERVAL HISTORY:  Jared [name] is here today for repeat clinical assessment.  He was last seen by me on 12/26/2023.  He received first dose bevacizumab  on that day.  Today, he presents for bevacizumab  and Venofer  300 mg.  Labs are improving.  He has noticed no abnormal bleeding.  Denies blood in his stool, hemoptysis, hematemesis, or epistaxis.  He denies skin rash or irritation.  He denies chest pain, chest pressure, or unusual shortness of breath. He denies headaches or visual disturbances. He denies abdominal pain, nausea, vomiting, or changes in bowel or bladder habits.    He denies fevers or chills. He denies pain. His appetite  is good. His weight has increased 8 pounds over last 2 weeks.  I have reviewed the past medical history, past surgical history, social history and family history with the patient and they are unchanged from previous note.  ALLERGIES:  is allergic to lisinopril .  MEDICATIONS:  Current Outpatient Medications  Medication Sig Dispense Refill   acetaminophen  (TYLENOL ) 500 MG  tablet Take 2 tablets (1,000 mg total) by mouth every 8 (eight) hours as needed for moderate pain. 100 tablet 1   albuterol  (PROVENTIL ) (2.5 MG/3ML) 0.083% nebulizer solution Take 3 mLs (2.5 mg total) by nebulization every 2 (two) hours as needed for wheezing or shortness of breath. 75 mL 1   albuterol  (VENTOLIN  HFA) 108 (90 Base) MCG/ACT inhaler INHALE 2 PUFFS INTO THE LUNGS EVERY 6 (SIX) HOURS AS NEEDED FOR WHEEZING OR SHORTNESS OF BREATH. 18 g 6   aspirin  EC 81 MG tablet Take 81 mg by mouth daily. Swallow whole.     atorvastatin  (LIPITOR ) 80 MG tablet Take 1 tablet (80 mg total) by mouth daily. 90 tablet 1   budesonide -glycopyrrolate -formoterol  (BREZTRI  AEROSPHERE) 160-9-4.8 MCG/ACT AERO inhaler Take 2 puffs first thing in morning and then another 2 puffs about 12 hours later. 10.7 g 11   DULoxetine  (CYMBALTA ) 60 MG capsule Take 1 capsule (60 mg total) by mouth daily. For chronic leg pains 90 capsule 1   ergocalciferol  (DRISDOL ) 1.25 MG (50000 UT) capsule Take 1 capsule (50,000 Units total) by mouth once a week. 12 capsule 1   ezetimibe  (ZETIA ) 10 MG tablet Take 1 tablet (10 mg total) by mouth daily. 90 tablet 2   ferrous sulfate  (FEROSUL) 325 (65 FE) MG tablet Take 1 tablet (325 mg total) by mouth 2 (two) times daily with a meal. 180 tablet 1   folic acid  (FOLVITE ) 1 MG tablet Take 1 tablet (1 mg total) by mouth daily. 90 tablet 1   furosemide  (LASIX ) 40 MG tablet Take 1 tablet (40 mg total) by mouth 2 (two) times daily. 180 tablet 1   gabapentin  (NEURONTIN ) 300 MG capsule Take 1 capsule (300 mg total) by mouth at bedtime. For leg pains 90 capsule 1   hydrALAZINE  (APRESOLINE ) 25 MG tablet Take 1 tablet (25 mg total) by mouth every 8 (eight) hours. 90 tablet 5   isosorbide  mononitrate (IMDUR ) 30 MG 24 hr tablet Take 1 tablet (30 mg total) by mouth daily. 90 tablet 1   metoprolol  succinate (TOPROL -XL) 50 MG 24 hr tablet Take 1 tablet (50 mg total) by mouth daily. 90 tablet 1   Multiple Vitamin  (MULTIVITAMIN WITH MINERALS) TABS tablet Take 1 tablet by mouth daily.     nitroGLYCERIN  (NITROSTAT ) 0.4 MG SL tablet Place 0.4 mg under the tongue every 5 (five) minutes as needed for chest pain.     OXYGEN  Inhale 2 L/min into the lungs continuous. (Patient taking differently: Inhale 3 L/min into the lungs continuous.)     pantoprazole  (PROTONIX ) 40 MG tablet Take 1 tablet (40 mg total) by mouth daily. 90 tablet 1   rivaroxaban  (XARELTO ) 20 MG TABS tablet Take 1 tablet (20 mg total) by mouth daily with supper. 90 tablet 1   No current facility-administered medications for this visit.     REVIEW OF SYSTEMS:   Constitutional: Denies fevers, chills or abnormal weight loss Eyes: Denies blurriness of vision Ears, nose, mouth, throat, and face: Denies mucositis or sore throat Respiratory: Denies cough, dyspnea or wheezes.  He does have underlying COPD and uses nasal cannula oxygen   at 3 L/min. Cardiovascular: Denies palpitation, chest discomfort or lower extremity swelling Gastrointestinal:  Denies nausea, heartburn or change in bowel habits Skin: Denies abnormal skin rashes Lymphatics: Denies new lymphadenopathy or easy bruising Neurological:Denies numbness, tingling or new weaknesses Behavioral/Psych: Mood is stable, no new changes  All other systems were reviewed with the patient and are negative.   VITALS:   Today's Vitals   01/10/24 1104  BP: 124/60  Pulse: 82  Resp: 16  Temp: 98.3 F (36.8 C)  TempSrc: Temporal  SpO2: 99%  Weight: 204 lb 14.4 oz (92.9 kg)  Height: 6' 3 (1.905 m)   Body mass index is 25.61 kg/m.   Wt Readings from Last 3 Encounters:  01/10/24 204 lb 14.4 oz (92.9 kg)  12/26/23 196 lb 11.2 oz (89.2 kg)  12/20/23 197 lb (89.4 kg)    Body mass index is 25.61 kg/m.  Performance status (ECOG): 1 - Symptomatic but completely ambulatory  PHYSICAL EXAM:   GENERAL:alert, no distress and comfortable SKIN: skin color, texture, turgor are normal, no rashes  or significant lesions EYES: normal, Conjunctiva are pink and non-injected, sclera clear OROPHARYNX:no exudate, no erythema and lips, buccal mucosa, and tongue normal  NECK: supple, thyroid  normal size, non-tender, without nodularity LYMPH:  no palpable lymphadenopathy in the cervical, axillary or inguinal LUNGS: Soft, expiratory wheezing can be heard mostly on the left side of the lung.  No rhonchi.  Patient on nasal cannula oxygen  at 3 L/min. HEART: regular rate & rhythm and no murmurs and no lower extremity edema ABDOMEN:abdomen soft, non-tender and normal bowel sounds Musculoskeletal:no cyanosis of digits and no clubbing  NEURO: alert & oriented x 3 with fluent speech, no focal motor/sensory deficits  LABORATORY DATA:  I have reviewed the data as listed    Component Value Date/Time   NA 142 11/19/2023 0844   NA 142 06/10/2023 1012   [name] 3.7 11/19/2023 0844   CL 97 (L) 11/19/2023 0844   CO2 35 (H) 11/13/2023 1710   GLUCOSE 113 (H) 11/19/2023 0844   BUN 12 11/19/2023 0844   BUN 13 06/10/2023 1012   CREATININE 0.80 11/19/2023 0844   CREATININE 1.20 07/30/2016 1431   CALCIUM  8.5 (L) 11/13/2023 1710   PROT 6.5 08/22/2023 1148   PROT 6.7 10/25/2022 1027   ALBUMIN  3.2 (L) 08/22/2023 1148   ALBUMIN  4.4 10/25/2022 1027   AST 19 08/22/2023 1148   ALT 22 08/22/2023 1148   ALKPHOS 64 08/22/2023 1148   BILITOT 0.3 08/22/2023 1148   BILITOT 0.3 10/25/2022 1027   GFRNONAA >60 11/13/2023 1710   GFRNONAA 64 07/30/2016 1431   GFRAA 91 01/29/2020 1020   GFRAA 74 07/30/2016 1431    Lab Results  Component Value Date   WBC 5.3 01/10/2024   NEUTROABS 3.8 01/10/2024   HGB 10.1 (L) 01/10/2024   HCT 36.2 (L) 01/10/2024   MCV 91.9 01/10/2024   PLT 259 01/10/2024    Iron /TIBC/Ferritin/ %Sat    Component Value Date/Time   IRON  38 (L) 01/10/2024 1031   IRON  355 (HH) 01/26/2021 0852   TIBC 337 01/10/2024 1031   TIBC <372 01/26/2021 0852   FERRITIN 44 01/10/2024 1031   FERRITIN 44  01/10/2024 1031   FERRITIN 26 (L) 04/06/2020 0821   IRONPCTSAT 11 (L) 01/10/2024 1031   IRONPCTSAT >95 (HH) 01/26/2021 9147

## 2024-01-11 ENCOUNTER — Encounter: Payer: Self-pay | Admitting: Gastroenterology

## 2024-01-12 ENCOUNTER — Encounter: Payer: Self-pay | Admitting: Nurse Practitioner

## 2024-01-12 ENCOUNTER — Encounter: Payer: Self-pay | Admitting: Hematology

## 2024-01-13 ENCOUNTER — Inpatient Hospital Stay: Payer: Medicare HMO

## 2024-01-13 ENCOUNTER — Inpatient Hospital Stay: Payer: Medicare HMO | Admitting: Nurse Practitioner

## 2024-01-17 ENCOUNTER — Ambulatory Visit: Admitting: Physician Assistant

## 2024-01-17 ENCOUNTER — Other Ambulatory Visit: Payer: Self-pay

## 2024-01-17 ENCOUNTER — Encounter: Payer: Self-pay | Admitting: Physician Assistant

## 2024-01-17 VITALS — BP 144/70 | HR 92 | Ht 73.0 in | Wt 201.1 lb

## 2024-01-17 DIAGNOSIS — Z862 Personal history of diseases of the blood and blood-forming organs and certain disorders involving the immune mechanism: Secondary | ICD-10-CM | POA: Diagnosis not present

## 2024-01-17 DIAGNOSIS — K5520 Angiodysplasia of colon without hemorrhage: Secondary | ICD-10-CM

## 2024-01-17 DIAGNOSIS — K219 Gastro-esophageal reflux disease without esophagitis: Secondary | ICD-10-CM

## 2024-01-17 DIAGNOSIS — K31A19 Gastric intestinal metaplasia without dysplasia, unspecified site: Secondary | ICD-10-CM

## 2024-01-17 DIAGNOSIS — D509 Iron deficiency anemia, unspecified: Secondary | ICD-10-CM

## 2024-01-17 DIAGNOSIS — Z1211 Encounter for screening for malignant neoplasm of colon: Secondary | ICD-10-CM

## 2024-01-17 DIAGNOSIS — Z9981 Dependence on supplemental oxygen: Secondary | ICD-10-CM

## 2024-01-17 DIAGNOSIS — Z800 Family history of malignant neoplasm of digestive organs: Secondary | ICD-10-CM | POA: Diagnosis not present

## 2024-01-17 MED ORDER — NA SULFATE-K SULFATE-MG SULF 17.5-3.13-1.6 GM/177ML PO SOLN
1.0000 | Freq: Once | ORAL | 0 refills | Status: AC
  Filled 2024-01-17: qty 354, 1d supply, fill #0

## 2024-01-17 NOTE — Progress Notes (Signed)
 Chief Complaint: Family history of colon cancer personal history of iron  deficiency anemia  HPI:    Jared [name] is a 68 year old male with a past medical history as listed below including CAD, and ICM/HFrEF (EF 55-60% on 11/24), a flutter, PAF (on aspirin  and Xarelto ), hypertension, NSVT, COPD on 3 L of O2, type 2 diabetes and IDA, known to Dr. Legrand, who returns to clinic today to discuss his family history of colon cancer and iron  deficiency anemia.      12/20/2023 patient seen by Dr. Suzann while Dr. Legrand was out.  At the time following up for iron  deficiency anemia with chronic GI blood loss from small bowel angiectasia status post APC.  Discussed longstanding history of iron  deficiency anemia and GI blood loss secondary to small bowel angiectasia's and had undergone numerous EGDs, SP, VCE as listed below.  Most recently hospitalized in March 2025 for weakness with anemia 6.1 (baseline 8), SP 08/22/2023 with 2 duodenal angioectasias in 1 jejunal angiectasia status post APC.  Hemoglobin then declined from 8.8-7.7 after hospitalization and a VCE on 4/25 showed 1 duodenal angiectasia and 3 jejunal angioectasias.  He had a single balloon enteroscopy completed 11/19/2023 which showed 2 duodenal and 2 jejunal angiectasia status post APC.  When seen last month he was doing well.  Received 2 units of PRBCs on 6/27 and iron  sucrose 7/10.  Following with hematology who were starting Bevacizumab  for management of bleeding related to small bowel angioectasias.  At that visit discussed the family history of colorectal cancer in his mother with his last colonoscopy in March 2020, repeat was recommended in the hospital setting due to his baseline O2 requirement.  At that visit discussed referral to [facility] for double-balloon enteroscopy and he elected to wait and see how medical management worked through hematology.  At that time discussed repeat EGD as well for gastric intestinal metaplasia mapping biopsies given that he  had gastrointestinal metaplasia in 2022.    01/10/2024 CBC with a hemoglobin of 10.1, ferritin 44, iron  low at 38, TIBC normal, percent saturation 11.    01/10/2024 patient had visit with hematology oncology and continued on bevacizumab  infusions.  They are monitoring his iron  levels, he was set up for another iron  infusion.    Today, patient is doing well.  He continues to follow with hematology.  He is continuing his bevacizumab  infusions.  They are following his labs.  The latest as above.  He tells me he is really just here to discuss a colonoscopy.    Denies fever, chills or weight loss.  Imaging Studies  CTAP 08/23/2023 1. Previous appendectomy. Drain has been removed. There is no longer any inflammatory change seen in the region. The appearance is satisfactory. 2. 3 mm nonobstructing stone in the midportion of the left kidney. 3. Aortic atherosclerosis. 4. Chronic lower lumbar degenerative changes.   CTAP 05/20/2023 Changes consistent with prior appendectomy. Persistent inflammatory changes are seen adjacent to the cecum. No abscess or extravasation is noted.   Repositioning of the drainage catheter right hemipelvis with decompression of previously seen residual air-fluid collection.   No new focal abnormality is noted.   CTAP 05/13/2023 1. Interval decrease in volume of crescent shaped fluid collection within the posterior pelvis, anterior to the rectum. The percutaneous, pigtail drainage catheter placed on 05/05/2023 is noted along the right inferior margin of this fluid collection. 2. Slight decrease size of tubular shaped fluid collection extending along the right pelvic sidewall measuring 2.4 x 1.7 by  5.8 cm. 3. Persistent increased caliber of the small bowel loops is again noted which appears stable to improved in the interval, likely reflecting ileus. 4. Small right pleural effusion. 5.  Aortic Atherosclerosis (ICD10-I70.0).     GI Procedures and Studies  Single balloon  enteroscopy 11/19/2023 - Esophageal mucosal changes consistent with short-segment Barrett's esophagus.  - Normal stomach.  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 Treated with argon plasma coagulation (APC).  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 Treated with argon plasma coagulation (APC). - -  A tattoo was seen in the jejunum.  - A small, superficial, mucosal disruption was seen in the fourth portion of the duodenum. Suspect this is due to the balloon apparatus. Clip (MR conditional) was placed. Clip manufacturer: [employer].   Video capsule endoscopy 09/30/2023    Small bowel enteroscopy 08/24/2023 - Normal upper third of esophagus and middle third of esophagus.  - Z-line irregular -appearance suspicious for Barrett's esophagus and similar to prior endoscopic evaluations.  - Normal gastric body, antrum, cardia and gastric fundus.  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 Treated with argon plasma coagulation (APC).  - Normal second portion of the duodenum, third portion of the duodenum and fourth portion of the duodenum. No evidence of active bleeding or stigmata of recent bleeding in the duodenum.  - A tattoo was seen in the jejunum placed during previous enteroscopy 05/2003.  - A single non-bleeding angioectasia in the jejunum located 10 to 15 cm distal to tattoo.. Treated with argon plasma coagulation (APC). No evidence of active bleeding or stigmata of recent bleeding in the jejunum.   Small bowel enteroscopy 05/20/2023 - Normal esophagus, stomach and duodenum - A single non-bleeding angioectasia in the jejunum. Treated with argon plasma coagulation (APC). Injected.   Upper endoscopy 12/27/2022 - Z-line irregular, 38 cm from incisors - Endo Clip found in gastric cardia - Normal duodenum   Small bowel enteroscopy 03/22/2022 - Z-line irregular.  - Normal esophagus  - Hemostasis clip x 2 were found in the stomach.  - Normal stomach otherwise  -  Two non-bleeding angiodysplastic lesions in the duodenum. Treated with argon plasma coagulation (APC).  - A single non-bleeding angiodysplastic lesion in the duodenum. Treated with argon plasma coagulation (APC).  - A tattoo was seen in the jejunum. - Normal small bowel otherwise.   Upper endoscopy 11/19/2020 - Z-line irregular, 44 cm from incisors - 3 previously placed endoclips in the gastric cardia/fundus without evidence of rebleeding or stigmata of recent bleeding - Normal duodenum   Small bowel enteroscopy 11/12/2020 - No gross lesions in esophagus proximally. Salmon-colored mucosa noted in distal esohagus - not biopsied due to concern for possible underlying varices.  - Hematin (altered blood/coffee-ground-like material) in the entire stomach - lavaged.  - 2 gastric ulcers as described above with a nonbleeding visible vessel (Forrest Class IIa).  - Gastritis. Biopsied.  - Blood in the entire examined duodenum - lavaged. - No gross lesions in the entire examined duodenum.  - Jejunal blood - lavaged . - No gross lesions in the entire examined proximal jejunum. Tattooed distal extent   Path: Chronic active gastritis with H. pylori and focal intestinal metaplasia   Small bowel enteroscopy 10/09/2020 - Normal esophagus, stomach, duodenum and jejunum   Video capsule endoscopy 06/18/2019 - Complete VCE with adequate prep complete VCE with adequate prep - Gastric erythema/gastritis - No active bleeding or AVMs visualized   Small bowel enteroscopy 10/28/2018 - Normal esophagus.  - Normal  stomach.  - Single AVM located 100 cm from the incisors. Treated with APC   Small bowel enteroscopy 09/28/2018 - Normal esophagus, stomach, duodenum and jejunum without angioectasias or other pathologic lesions   Colonoscopy 09/06/2018 - Complete colonoscopy with good bowel prep - Normal colon and TI (intubated 10 cm) - Internal hemorrhoids   Upper endoscopy 09/05/2018 - 2 cm HH - Mild esophagitis -  Otherwise normal without cause for severe anemia Past Medical History:  Diagnosis Date   Acute blood loss anemia 05/18/2023   Acute perforated appendicitis 04/30/2023   AKI (acute kidney injury) (HCC) 11/18/2020   Atrial fibrillation with RVR (HCC) 05/06/2023   AVM (arteriovenous malformation)    CAD (coronary artery disease)    a. LHC 5/12:  LAD 20, pLCx 20, pRCA 40, dRCA 40, EF 35%, diff HK  //  b. Myoview 4/16: Overall Impression:  High risk stress nuclear study There is no evidence of ischemia.  There is severe LV dysfunction. LV Ejection Fraction: 30%.  LV Wall Motion:  There is global LV hypokinesis.     CAP (community acquired pneumonia) 09/2013   Chronic combined systolic and diastolic CHF (congestive heart failure) (HCC)    a. Echo 4/16:Mild LVH, EF 40-45%, diffuse HK //  b. Echo 8/17: EF 35-40%, diffuse HK, diastolic dysfunction, aortic sclerosis, trivial MR, moderate LAE, normal RVSF, moderate RAE, mild TR, PASP 42 mmHg // c. Echo 4/18: Mild concentric LVH, EF 30-35, normal wall motion, grade 1 diastolic dysfunction, PASP 49   Chronic respiratory failure (HCC)    Cluster headache    hx; haven't had one in awhile (01/09/2016)   COPD (chronic obstructive pulmonary disease) (HCC)    thelbert 01/09/2016   DM (diabetes mellitus) (HCC)    History of CVA (cerebrovascular accident)    Hypertension    IDA (iron  deficiency anemia)    Moderate tobacco use disorder    NICM (nonischemic cardiomyopathy) (HCC)    Nicotine  addiction    Peripheral vascular disease (HCC)    Syncope 06/11/2019   Tobacco abuse    Type 2 diabetes mellitus (HCC) 05/14/2016    Past Surgical History:  Procedure Laterality Date   ABDOMINAL AORTOGRAM W/LOWER EXTREMITY N/A 12/07/2022   Procedure: ABDOMINAL AORTOGRAM W/LOWER EXTREMITY;  Surgeon: Magda Debby SAILOR, MD;  [facility]: [facility];  Service: Cardiovascular;  Laterality: N/A;   ABDOMINAL AORTOGRAM W/LOWER EXTREMITY Left 08/02/2023   Procedure: ABDOMINAL  AORTOGRAM W/LOWER EXTREMITY;  Surgeon: Magda Debby SAILOR, MD;  Location: [facility];  Service: Cardiovascular;  Laterality: Left;   BALLOON ENTEROSCOPY N/A 11/19/2023   Procedure: ENTEROSCOPY, USING BALLOON;  Surgeon: San Sandor GAILS, DO;  Location: [facility];  Service: Gastroenterology;  Laterality: N/A;  single balloon enteroscopy   BIOPSY  11/12/2020   Procedure: BIOPSY;  Surgeon: Wilhelmenia Aloha Raddle., MD;  Location: [facility];  Service: Gastroenterology;;   CARDIAC CATHETERIZATION  10/2010   LM normal, LAD with 20% irregularities, LCX with 20%, RCA with 40% prox and 40% distal - EF of 35%   CATARACT EXTRACTION, BILATERAL     COLONOSCOPY W/ BIOPSIES AND POLYPECTOMY     COLONOSCOPY WITH PROPOFOL  N/A 09/06/2018   Procedure: COLONOSCOPY WITH PROPOFOL ;  Surgeon: Eda Iha, MD;  Location: [facility] ENDOSCOPY;  Service: Gastroenterology;  Laterality: N/A;   ENTEROSCOPY N/A 09/28/2018   Procedure: ENTEROSCOPY;  Surgeon: Rollin Dover, MD;  Location: [facility] ENDOSCOPY;  Service: Endoscopy;  Laterality: N/A;   ENTEROSCOPY N/A 10/28/2018   Procedure: ENTEROSCOPY;  Surgeon: Eda,  Suzen, MD;  Location: [facility] ENDOSCOPY;  Service: Gastroenterology;  Laterality: N/A;   ENTEROSCOPY N/A 10/09/2020   Procedure: ENTEROSCOPY;  Surgeon: Abran Norleen SAILOR, MD;  Location: [facility] ENDOSCOPY;  Service: Endoscopy;  Laterality: N/A;   ENTEROSCOPY N/A 03/22/2022   Procedure: ENTEROSCOPY;  Surgeon: Leigh Elspeth SQUIBB, MD;  Location: [facility] ENDOSCOPY;  Service: Gastroenterology;  Laterality: N/A;   ENTEROSCOPY N/A 05/20/2023   Procedure: ENTEROSCOPY;  Surgeon: Legrand Victory LITTIE DOUGLAS, MD;  Location: [facility] ENDOSCOPY;  Service: Gastroenterology;  Laterality: N/A;   ENTEROSCOPY N/A 08/24/2023   Procedure: ENTEROSCOPY;  Surgeon: Suzann [name] HERO, MD;  Location: [facility] ENDOSCOPY;  Service: Gastroenterology;  Laterality: N/A;   ESOPHAGOGASTRODUODENOSCOPY N/A 11/12/2020   Procedure: ESOPHAGOGASTRODUODENOSCOPY (EGD);  Surgeon: Wilhelmenia Aloha Raddle., MD;  Location: [facility];  Service: Gastroenterology;  Laterality: N/A;   ESOPHAGOGASTRODUODENOSCOPY (EGD) WITH PROPOFOL  N/A 09/05/2018   Procedure: ESOPHAGOGASTRODUODENOSCOPY (EGD) WITH PROPOFOL ;  Surgeon: Leigh Elspeth SQUIBB, MD;  Location: [facility] ENDOSCOPY;  Service: Gastroenterology;  Laterality: N/A;   ESOPHAGOGASTRODUODENOSCOPY (EGD) WITH PROPOFOL  N/A 11/19/2020   Procedure: ESOPHAGOGASTRODUODENOSCOPY (EGD) WITH PROPOFOL ;  Surgeon: Albertus Gordy HERO, MD;  Location: [facility];  Service: Gastroenterology;  Laterality: N/A;   ESOPHAGOGASTRODUODENOSCOPY (EGD) WITH PROPOFOL  N/A 12/27/2022   Procedure: ESOPHAGOGASTRODUODENOSCOPY (EGD) WITH PROPOFOL ;  Surgeon: Shila Gustav GAILS, MD;  Location: [facility];  Service: Gastroenterology;  Laterality: N/A;   EXCISION MASS HEAD     GIVENS CAPSULE STUDY N/A 09/06/2018   Procedure: GIVENS CAPSULE STUDY;  Surgeon: Eda Suzen, MD;  Location: [facility] ENDOSCOPY;  Service: Gastroenterology;  Laterality: N/A;   GIVENS CAPSULE STUDY N/A 09/26/2018   Procedure: GIVENS CAPSULE STUDY;  Surgeon: Albertus Gordy HERO, MD;  Location: [facility] ENDOSCOPY;  Service: Gastroenterology;  Laterality: N/A;   GIVENS CAPSULE STUDY N/A 06/14/2019   Procedure: GIVENS CAPSULE STUDY;  Surgeon: Shila Gustav GAILS, MD;  Location: [facility];  Service: Endoscopy;  Laterality: N/A;   HEMOSTASIS CLIP PLACEMENT  11/12/2020   Procedure: HEMOSTASIS CLIP PLACEMENT;  Surgeon: Wilhelmenia Aloha Raddle., MD;  Location: [facility];  Service: Gastroenterology;;   HEMOSTASIS CONTROL  11/12/2020   Procedure: HEMOSTASIS CONTROL;  Surgeon: Wilhelmenia Aloha Raddle., MD;  Location: [facility];  Service: Gastroenterology;;   HOT HEMOSTASIS N/A 10/28/2018   Procedure: HOT HEMOSTASIS (ARGON PLASMA COAGULATION/BICAP);  Surgeon: Eda Suzen, MD;  Location: [facility] ENDOSCOPY;  Service: Gastroenterology;  Laterality: N/A;   HOT HEMOSTASIS N/A 11/12/2020   Procedure: HOT HEMOSTASIS (ARGON PLASMA  COAGULATION/BICAP);  Surgeon: Wilhelmenia Aloha Raddle., MD;  Location: [facility];  Service: Gastroenterology;  Laterality: N/A;   HOT HEMOSTASIS N/A 03/22/2022   Procedure: HOT HEMOSTASIS (ARGON PLASMA COAGULATION/BICAP);  Surgeon: Leigh Elspeth SQUIBB, MD;  Location: [facility] ENDOSCOPY;  Service: Gastroenterology;  Laterality: N/A;   HOT HEMOSTASIS N/A 05/20/2023   Procedure: HOT HEMOSTASIS (ARGON PLASMA COAGULATION/BICAP);  Surgeon: Legrand Victory LITTIE DOUGLAS, MD;  Location: [facility] ENDOSCOPY;  Service: Gastroenterology;  Laterality: N/A;   HOT HEMOSTASIS N/A 08/24/2023   Procedure: EGD, WITH ARGON PLASMA COAGULATION;  Surgeon: Suzann [name] HERO, MD;  Location: [facility] ENDOSCOPY;  Service: Gastroenterology;  Laterality: N/A;   INCISION AND DRAINAGE PERIRECTAL ABSCESS N/A 06/05/2017   Procedure: IRRIGATION AND DEBRIDEMENT PERIRECTAL ABSCESS;  Surgeon: Teresa Lonni HERO, MD;  Location: [facility];  Service: General;  Laterality: N/A;   IR CATHETER TUBE CHANGE  05/14/2023   LAPAROSCOPIC APPENDECTOMY N/A 05/01/2023   Procedure: APPENDECTOMY LAPAROSCOPIC;  Surgeon: Ebbie Cough, MD;  Location: [facility] OR;  Service: General;  Laterality: N/A;   PERIPHERAL VASCULAR BALLOON ANGIOPLASTY  Left 08/02/2023   Procedure: PERIPHERAL VASCULAR BALLOON ANGIOPLASTY;  Surgeon: Magda Debby SAILOR, MD;  [facility];  Service: Cardiovascular;  Laterality: Left;   PERIPHERAL VASCULAR INTERVENTION  12/07/2022   Procedure: PERIPHERAL VASCULAR INTERVENTION;  Surgeon: Magda Debby SAILOR, MD;  [facility];  Service: Cardiovascular;;   PERIPHERAL VASCULAR INTERVENTION Left 08/02/2023   Procedure: PERIPHERAL VASCULAR INTERVENTION;  Surgeon: Magda Debby SAILOR, MD;  [facility]: [facility];  Service: Cardiovascular;  Laterality: Left;   SUBMUCOSAL TATTOO INJECTION  11/12/2020   Procedure: SUBMUCOSAL TATTOO INJECTION;  Surgeon: Wilhelmenia Aloha Raddle., MD;  Location: [facility];  Service: Gastroenterology;;   SUBMUCOSAL  TATTOO INJECTION  05/20/2023   Procedure: SUBMUCOSAL TATTOO INJECTION;  Surgeon: Legrand Victory LITTIE DOUGLAS, MD;  Location: [facility] ENDOSCOPY;  Service: Gastroenterology;;   VIDEO BRONCHOSCOPY Bilateral 05/08/2016   Procedure: VIDEO BRONCHOSCOPY WITH FLUORO;  Surgeon: Harden LULLA Staff, MD;  Location: [facility] ENDOSCOPY;  Service: Cardiopulmonary;  Laterality: Bilateral;    Current Outpatient Medications  Medication Sig Dispense Refill   acetaminophen  (TYLENOL ) 500 MG tablet Take 2 tablets (1,000 mg total) by mouth every 8 (eight) hours as needed for moderate pain. 100 tablet 1   albuterol  (PROVENTIL ) (2.5 MG/3ML) 0.083% nebulizer solution Take 3 mLs (2.5 mg total) by nebulization every 2 (two) hours as needed for wheezing or shortness of breath. 75 mL 1   albuterol  (VENTOLIN  HFA) 108 (90 Base) MCG/ACT inhaler INHALE 2 PUFFS INTO THE LUNGS EVERY 6 (SIX) HOURS AS NEEDED FOR WHEEZING OR SHORTNESS OF BREATH. 18 g 6   aspirin  EC 81 MG tablet Take 81 mg by mouth daily. Swallow whole.     atorvastatin  (LIPITOR ) 80 MG tablet Take 1 tablet (80 mg total) by mouth daily. 90 tablet 1   budesonide -glycopyrrolate -formoterol  (BREZTRI  AEROSPHERE) 160-9-4.8 MCG/ACT AERO inhaler Take 2 puffs first thing in morning and then another 2 puffs about 12 hours later. 10.7 g 11   DULoxetine  (CYMBALTA ) 60 MG capsule Take 1 capsule (60 mg total) by mouth daily. For chronic leg pains 90 capsule 1   ergocalciferol  (DRISDOL ) 1.25 MG (50000 UT) capsule Take 1 capsule (50,000 Units total) by mouth once a week. 12 capsule 1   ezetimibe  (ZETIA ) 10 MG tablet Take 1 tablet (10 mg total) by mouth daily. 90 tablet 2   ferrous sulfate  (FEROSUL) 325 (65 FE) MG tablet Take 1 tablet (325 mg total) by mouth 2 (two) times daily with a meal. 180 tablet 1   folic acid  (FOLVITE ) 1 MG tablet Take 1 tablet (1 mg total) by mouth daily. 90 tablet 1   furosemide  (LASIX ) 40 MG tablet Take 1 tablet (40 mg total) by mouth 2 (two) times daily. 180 tablet 1   gabapentin   (NEURONTIN ) 300 MG capsule Take 1 capsule (300 mg total) by mouth at bedtime. For leg pains 90 capsule 1   hydrALAZINE  (APRESOLINE ) 25 MG tablet Take 1 tablet (25 mg total) by mouth every 8 (eight) hours. 90 tablet 5   isosorbide  mononitrate (IMDUR ) 30 MG 24 hr tablet Take 1 tablet (30 mg total) by mouth daily. 90 tablet 1   metoprolol  succinate (TOPROL -XL) 50 MG 24 hr tablet Take 1 tablet (50 mg total) by mouth daily. 90 tablet 1   Multiple Vitamin (MULTIVITAMIN WITH MINERALS) TABS tablet Take 1 tablet by mouth daily.     Na Sulfate-K Sulfate-Mg Sulfate concentrate (SUPREP) 17.5-3.13-1.6 GM/177ML SOLN Take 1 kit (354 mLs total) by mouth once for 1 dose. 354  mL 0   nitroGLYCERIN  (NITROSTAT ) 0.4 MG SL tablet Place 0.4 mg under the tongue every 5 (five) minutes as needed for chest pain.     OXYGEN  Inhale 2 L/min into the lungs continuous. (Patient taking differently: Inhale 3 L/min into the lungs continuous.)     pantoprazole  (PROTONIX ) 40 MG tablet Take 1 tablet (40 mg total) by mouth daily. 90 tablet 1   rivaroxaban  (XARELTO ) 20 MG TABS tablet Take 1 tablet (20 mg total) by mouth daily with supper. 90 tablet 1   No current facility-administered medications for this visit.    Allergies as of 01/17/2024 - Review Complete 01/17/2024  Allergen Reaction Noted   Lisinopril  Cough 06/29/2014    Family History  Problem Relation Age of Onset   Heart disease Mother    Diabetes Mother    Colon cancer Mother    Liver cancer Mother    Cancer Father        type unknown   Diabetes Sister        x 2   Diabetes Brother     Social History   Socioeconomic History   Marital status: Single    Spouse name: Not on file   Number of children: 1   Years of education: Not on file   Highest education level: 12th grade  Occupational History   Occupation: retired  Tobacco Use   Smoking status: Every Day    Current packs/day: 0.50    Average packs/day: 0.5 packs/day for 47.0 years (23.5 ttl pk-yrs)     Types: Cigarettes   Smokeless tobacco: Never   Tobacco comments:    4/5 cigs  Vaping Use   Vaping status: Never Used  Substance and Sexual Activity   Alcohol use: Not Currently    Alcohol/week: 0.0 standard drinks of alcohol    Comment: last drink was before xmas   Drug use: No    Types: Cocaine, Marijuana    Comment: nothing in 20 years   Sexual activity: Not Currently  Other Topics Concern   Not on file  Social History Narrative   unemployed   Social Drivers of Corporate investment banker Strain: Low Risk  (06/10/2023)   Overall Financial Resource Strain (CARDIA)    Difficulty of Paying Living Expenses: Not hard at all  Food Insecurity: No Food Insecurity (08/27/2023)   Hunger Vital Sign    Worried About Running Out of Food in the Last Year: Never true    Ran Out of Food in the Last Year: Never true  Recent Concern: Food Insecurity - Food Insecurity Present (06/10/2023)   Hunger Vital Sign    Worried About Running Out of Food in the Last Year: Sometimes true    Ran Out of Food in the Last Year: Never true  Transportation Needs: No Transportation Needs (08/27/2023)   PRAPARE - Administrator, Civil Service (Medical): No    Lack of Transportation (Non-Medical): No  Physical Activity: Insufficiently Active (06/10/2023)   Exercise Vital Sign    Days of Exercise per Week: 3 days    Minutes of Exercise per Session: 20 min  Stress: No Stress Concern Present (06/10/2023)   [employer] of Occupational Health - Occupational Stress Questionnaire    Feeling of Stress : Not at all  Social Connections: Moderately Isolated (08/22/2023)   Social Connection and Isolation Panel    Frequency of Communication with Friends and Family: More than three times a week    Frequency of Social  Gatherings with Friends and Family: Twice a week    Attends Religious Services: Never    Database administrator or Organizations: Yes    Attends Banker Meetings: 1 to 4 times  per year    Marital Status: Divorced  Catering manager Violence: Not At Risk (08/27/2023)   Humiliation, Afraid, Rape, and Kick questionnaire    Fear of Current or Ex-Partner: No    Emotionally Abused: No    Physically Abused: No    Sexually Abused: No    Review of Systems:    Constitutional: No weight loss, fever or chills Cardiovascular: No chest pain Respiratory: No SOB Gastrointestinal: See HPI and otherwise negative   Physical Exam:  Vital signs: BP (!) 144/70 (BP Location: Left Arm, Patient Position: Sitting, Cuff Size: Normal)   Pulse 92   Ht 6' 1 (1.854 m) Comment: height measured without shoes  Wt 201 lb 2 oz (91.2 kg)   BMI 26.54 kg/m   Constitutional:   Pleasant chronically ill appearing AA male appears to be in NAD, Well developed, alert and cooperative Respiratory: Respirations even and unlabored. Lungs clear to auscultation bilaterally.   No wheezes, crackles, or rhonchi. +on O2 via [place] Cardiovascular: Normal S1, S2. No MRG. Regular rate and rhythm. No peripheral edema, cyanosis or pallor.  Gastrointestinal:  Soft, nondistended, nontender. No rebound or guarding. Normal bowel sounds. No appreciable masses or hepatomegaly. Rectal:  Not performed.  Psychiatric: Oriented to person, place and time. Demonstrates good judgement and reason without abnormal affect or behaviors.  RELEVANT LABS AND IMAGING: CBC    Component Value Date/Time   WBC 5.3 01/10/2024 1031   WBC 6.9 12/06/2023 1356   RBC 3.94 (L) 01/10/2024 1031   HGB 10.1 (L) 01/10/2024 1031   HGB 10.2 (L) 06/10/2023 1012   HCT 36.2 (L) 01/10/2024 1031   HCT 33.9 (L) 06/10/2023 1012   PLT 259 01/10/2024 1031   PLT 332 06/10/2023 1012   MCV 91.9 01/10/2024 1031   MCV 87 06/10/2023 1012   MCH 25.6 (L) 01/10/2024 1031   MCHC 27.9 (L) 01/10/2024 1031   RDW 16.0 (H) 01/10/2024 1031   RDW 14.3 06/10/2023 1012   LYMPHSABS 1.0 01/10/2024 1031   LYMPHSABS 2.7 06/10/2023 1012   MONOABS 0.4 01/10/2024 1031    EOSABS 0.1 01/10/2024 1031   EOSABS 0.1 06/10/2023 1012   BASOSABS 0.0 01/10/2024 1031   BASOSABS 0.1 06/10/2023 1012    CMP     Component Value Date/Time   NA 142 11/19/2023 0844   NA 142 06/10/2023 1012   K 3.7 11/19/2023 0844   CL 97 (L) 11/19/2023 0844   CO2 35 (H) 11/13/2023 1710   GLUCOSE 113 (H) 11/19/2023 0844   BUN 12 11/19/2023 0844   BUN 13 06/10/2023 1012   CREATININE 0.80 11/19/2023 0844   CREATININE 1.20 07/30/2016 1431   CALCIUM  8.5 (L) 11/13/2023 1710   PROT 6.5 08/22/2023 1148   PROT 6.7 10/25/2022 1027   ALBUMIN  3.2 (L) 08/22/2023 1148   ALBUMIN  4.4 10/25/2022 1027   AST 19 08/22/2023 1148   ALT 22 08/22/2023 1148   ALKPHOS 64 08/22/2023 1148   BILITOT 0.3 08/22/2023 1148   BILITOT 0.3 10/25/2022 1027   GFRNONAA >60 11/13/2023 1710   GFRNONAA 64 07/30/2016 1431   GFRAA 91 01/29/2020 1020   GFRAA 74 07/30/2016 1431    Assessment: 1.  Family history of colon cancer: In his mother diagnosed at the age of 45 2.  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  Gastric intestinal metaplasia: On EGD in 2022, discussed repeating EGD for gastric mapping biopsies at last visit, will confirm with Dr. Legrand if he wants this added on 4.  GERD: On Pantoprazole  40 mg daily  Plan: 1.  Scheduled patient for a hospital-based colonoscopy given chronic O2 use.  Will discuss possible EGD as well for gastric mapping with Dr. Legrand.  Did provide the patient with a detailed list of risks of procedure and he agrees to proceed. 2.  Continue to follow with hematology /oncology in regards to bevacizumab  infusions and management of chronic GI blood loss from angioectasias 3.  In the  past referral to [facility] for double-balloon enteroscopy was discussed if medical management ineffective 4.  Continue Pantoprazole  40 mg daily 5.  Patient to follow in clinic per recommendations from Dr. Legrand after time of procedure.  Delon Failing, PA-C [facility] 01/17/2024, 4:20 PM  Cc: Newlin, Enobong, MD

## 2024-01-17 NOTE — Patient Instructions (Signed)
 You will be contacted by our office prior to your procedure for directions on holding your Xarelto .  If you do not hear from our office 1 week prior to your scheduled procedure, please call [phone] to discuss.   You have been scheduled for a colonoscopy. Please follow written instructions given to you at your visit today.   If you use inhalers (even only as needed), please bring them with you on the day of your procedure.  DO NOT TAKE 7 DAYS PRIOR TO TEST- Trulicity (dulaglutide) Ozempic, Wegovy (semaglutide) Mounjaro (tirzepatide) Bydureon Bcise (exanatide extended release)  DO NOT TAKE 1 DAY PRIOR TO YOUR TEST Rybelsus (semaglutide) Adlyxin (lixisenatide) Victoza (liraglutide) Byetta (exanatide) _______________________________________________________________________

## 2024-01-21 ENCOUNTER — Other Ambulatory Visit: Payer: Self-pay

## 2024-01-21 ENCOUNTER — Telehealth: Payer: Self-pay | Admitting: Hematology

## 2024-01-21 NOTE — Telephone Encounter (Signed)
 Scheduled appointments per WQ. Talked with the patient and he is aware of the made appointments.

## 2024-01-23 ENCOUNTER — Encounter: Payer: Self-pay | Admitting: Hematology

## 2024-01-23 ENCOUNTER — Inpatient Hospital Stay

## 2024-01-23 ENCOUNTER — Inpatient Hospital Stay (HOSPITAL_BASED_OUTPATIENT_CLINIC_OR_DEPARTMENT_OTHER): Admitting: Hematology

## 2024-01-23 VITALS — BP 130/70 | HR 97 | Temp 98.6°F | Resp 18 | Ht 73.0 in | Wt 201.7 lb

## 2024-01-23 DIAGNOSIS — K274 Chronic or unspecified peptic ulcer, site unspecified, with hemorrhage: Secondary | ICD-10-CM | POA: Diagnosis not present

## 2024-01-23 DIAGNOSIS — D500 Iron deficiency anemia secondary to blood loss (chronic): Secondary | ICD-10-CM

## 2024-01-23 DIAGNOSIS — K31819 Angiodysplasia of stomach and duodenum without bleeding: Secondary | ICD-10-CM

## 2024-01-23 DIAGNOSIS — D649 Anemia, unspecified: Secondary | ICD-10-CM

## 2024-01-23 DIAGNOSIS — Z5112 Encounter for antineoplastic immunotherapy: Secondary | ICD-10-CM | POA: Diagnosis not present

## 2024-01-23 DIAGNOSIS — Z79899 Other long term (current) drug therapy: Secondary | ICD-10-CM | POA: Diagnosis not present

## 2024-01-23 LAB — CBC WITH DIFFERENTIAL (CANCER CENTER ONLY)
Abs Immature Granulocytes: 0 K/uL (ref 0.00–0.07)
Basophils Absolute: 0 K/uL (ref 0.0–0.1)
Basophils Relative: 1 %
Eosinophils Absolute: 0.1 K/uL (ref 0.0–0.5)
Eosinophils Relative: 3 %
HCT: 41.8 % (ref 39.0–52.0)
Hemoglobin: 11.6 g/dL — ABNORMAL LOW (ref 13.0–17.0)
Immature Granulocytes: 0 %
Lymphocytes Relative: 25 %
Lymphs Abs: 1.1 K/uL (ref 0.7–4.0)
MCH: 25.4 pg — ABNORMAL LOW (ref 26.0–34.0)
MCHC: 27.8 g/dL — ABNORMAL LOW (ref 30.0–36.0)
MCV: 91.5 fL (ref 80.0–100.0)
Monocytes Absolute: 0.4 K/uL (ref 0.1–1.0)
Monocytes Relative: 8 %
Neutro Abs: 2.8 K/uL (ref 1.7–7.7)
Neutrophils Relative %: 63 %
Platelet Count: 171 K/uL (ref 150–400)
RBC: 4.57 MIL/uL (ref 4.22–5.81)
RDW: 14.9 % (ref 11.5–15.5)
WBC Count: 4.4 K/uL (ref 4.0–10.5)
nRBC: 0 % (ref 0.0–0.2)

## 2024-01-23 LAB — IRON AND IRON BINDING CAPACITY (CC-WL,HP ONLY)
Iron: 36 ug/dL — ABNORMAL LOW (ref 45–182)
Saturation Ratios: 11 % — ABNORMAL LOW (ref 17.9–39.5)
TIBC: 322 ug/dL (ref 250–450)
UIBC: 286 ug/dL (ref 117–376)

## 2024-01-23 LAB — SAMPLE TO BLOOD BANK

## 2024-01-23 LAB — TOTAL PROTEIN, URINE DIPSTICK: Protein, ur: 30 mg/dL — AB

## 2024-01-23 LAB — FERRITIN: Ferritin: 109 ng/mL (ref 24–336)

## 2024-01-23 MED ORDER — SODIUM CHLORIDE 0.9 % IV SOLN
5.0000 mg/kg | Freq: Once | INTRAVENOUS | Status: AC
  Administered 2024-01-23: 450 mg via INTRAVENOUS
  Filled 2024-01-23: qty 4

## 2024-01-23 MED ORDER — SODIUM CHLORIDE 0.9 % IV SOLN: INTRAVENOUS | Status: DC

## 2024-01-23 MED ORDER — SODIUM CHLORIDE 0.9 % IV SOLN
300.0000 mg | Freq: Once | INTRAVENOUS | Status: AC
  Administered 2024-01-23: 300 mg via INTRAVENOUS
  Filled 2024-01-23: qty 200

## 2024-01-23 NOTE — Progress Notes (Signed)
 [facility]   [phone] [phone]   Clinic Follow up Note   Patient Care Team: Delbert Clam, MD as PCP - General (Family Medicine) Okey Vina GAILS, MD as PCP - Cardiology (Cardiology) [facility] Burton, Lacie K, NP as Nurse Practitioner (Hematology and Oncology) Joya Ade, DPM as Consulting Physician (Podiatry)  Date of Service:  01/23/2024  CHIEF COMPLAINT: f/u of anemia   CURRENT THERAPY:  Bevacizumab  every 2 weeks for 4 doses IV Venofer  300 mg every 2 to 4 weeks if ferritin less than 200  Oncology History   Iron  deficiency anemia due to chronic blood loss secondary to h/o GI bleed, PUD, and inadequate iron  replacement -First found to have iron  deficiency 09/05/2018 with serum iron  9, 3% saturation, TIBC 358, and ferritin of 3 with a hemoglobin of 4.9, s/p blood transfusion  -EGD showed esophagitis, colonoscopy and small bowel endoscopy were unremarkable. Anemia improved -Developed acute on chronic anemia 10/2018, small bowel endoscopy  -Began receiving IV iron  at that time (Feraheme  510 mg x1 episodically), and been on iv venofer  lately, responded well  -Capsule endoscopy 06/18/19 showed gastritis, no AVMs or bleeding.  -continue iv iron  as needed, if ferrintin<200  Assessment & Plan Anemia due to gastrointestinal bleeding Anemia secondary to gastrointestinal bleeding with improved hemoglobin levels. Current hemoglobin is 11.6, an improvement from previous levels of 10 and 8. No recent episodes of gastrointestinal bleeding reported. Treatment with IV iron  and bevacizumab  is ongoing and appears effective. Discussed potential side effects of bevacizumab , including risk of thrombosis and hypertension, but short-term use should mitigate these risks. Emphasized the importance of staying active to reduce thrombosis risk. Ferritin level to be monitored to maintain adequate iron  levels for anemia management. - Administer IV  iron  today. - Administer bevacizumab  today. - Schedule next infusion for August 28th. - Monitor blood counts after the next infusion. - Order ferritin level today.   Plan - Lab reviewed, adequate for treatment, will proceed cycle 3 bevacizumab  today - He will return in 2 weeks for last dose bevacizumab  - Will proceed Venofer  300 mg today and continue every 2 weeks if ferritin less than 200 - Follow-up in 4 weeks   Discussed the use of AI scribe software for clinical note transcription with the patient, who gave verbal consent to proceed.  History of Present Illness Jared [name] is a 68 year old male with anemia from GI bleeding who presents for follow-up.  His hemoglobin has improved to 11.6 g/dL from previous levels of 10 g/dL and 8 g/dL. He receives IV iron  and bevacizumab  infusions without issues. No recent bleeding, melena, or hematochezia. He had two IV iron  infusions in July, one in August, and three in June, with the last on August 1st. Ferritin level was 44 at the last check, and he is due for another test today.     All other systems were reviewed with the patient and are negative.  MEDICAL HISTORY:  Past Medical History:  Diagnosis Date   Acute blood loss anemia 05/18/2023   Acute perforated appendicitis 04/30/2023   AKI (acute kidney injury) (HCC) 11/18/2020   Atrial fibrillation with RVR (HCC) 05/06/2023   AVM (arteriovenous malformation)    CAD (coronary artery disease)    a. LHC 5/12:  LAD 20, pLCx 20, pRCA 40, dRCA 40, EF 35%, diff HK  //  b. Myoview 4/16: Overall Impression:  High risk stress nuclear study There is no evidence of ischemia.  There is severe  LV dysfunction. LV Ejection Fraction: 30%.  LV Wall Motion:  There is global LV hypokinesis.     CAP (community acquired pneumonia) 09/2013   Chronic combined systolic and diastolic CHF (congestive heart failure) (HCC)    a. Echo 4/16:Mild LVH, EF 40-45%, diffuse HK //  b. Echo 8/17: EF 35-40%, diffuse HK,  diastolic dysfunction, aortic sclerosis, trivial MR, moderate LAE, normal RVSF, moderate RAE, mild TR, PASP 42 mmHg // c. Echo 4/18: Mild concentric LVH, EF 30-35, normal wall motion, grade 1 diastolic dysfunction, PASP 49   Chronic respiratory failure (HCC)    Cluster headache    hx; haven't had one in awhile (01/09/2016)   COPD (chronic obstructive pulmonary disease) (HCC)    thelbert 01/09/2016   DM (diabetes mellitus) (HCC)    History of CVA (cerebrovascular accident)    Hypertension    IDA (iron  deficiency anemia)    Moderate tobacco use disorder    NICM (nonischemic cardiomyopathy) (HCC)    Nicotine  addiction    Peripheral vascular disease (HCC)    Syncope 06/11/2019   Tobacco abuse    Type 2 diabetes mellitus (HCC) 05/14/2016    SURGICAL HISTORY: Past Surgical History:  Procedure Laterality Date   ABDOMINAL AORTOGRAM W/LOWER EXTREMITY N/A 12/07/2022   Procedure: ABDOMINAL AORTOGRAM W/LOWER EXTREMITY;  Surgeon: Magda Debby SAILOR, MD;  [facility];  Service: Cardiovascular;  Laterality: N/A;   ABDOMINAL AORTOGRAM W/LOWER EXTREMITY Left 08/02/2023   Procedure: ABDOMINAL AORTOGRAM W/LOWER EXTREMITY;  Surgeon: Magda Debby SAILOR, MD;  [facility]: [facility];  Service: Cardiovascular;  Laterality: Left;   BALLOON ENTEROSCOPY N/A 11/19/2023   Procedure: ENTEROSCOPY, USING BALLOON;  Surgeon: San Sandor GAILS, DO;  Location: [facility];  Service: Gastroenterology;  Laterality: N/A;  single balloon enteroscopy   BIOPSY  11/12/2020   Procedure: BIOPSY;  Surgeon: Wilhelmenia Aloha Raddle., MD;  Location: [facility];  Service: Gastroenterology;;   CARDIAC CATHETERIZATION  10/2010   LM normal, LAD with 20% irregularities, LCX with 20%, RCA with 40% prox and 40% distal - EF of 35%   CATARACT EXTRACTION, BILATERAL     COLONOSCOPY W/ BIOPSIES AND POLYPECTOMY     COLONOSCOPY WITH PROPOFOL  N/A 09/06/2018   Procedure: COLONOSCOPY WITH PROPOFOL ;  Surgeon: Eda Iha, MD;   Location: [facility] ENDOSCOPY;  Service: Gastroenterology;  Laterality: N/A;   ENTEROSCOPY N/A 09/28/2018   Procedure: ENTEROSCOPY;  Surgeon: Rollin Dover, MD;  Location: [facility] ENDOSCOPY;  Service: Endoscopy;  Laterality: N/A;   ENTEROSCOPY N/A 10/28/2018   Procedure: ENTEROSCOPY;  Surgeon: Eda Iha, MD;  Location: [facility] ENDOSCOPY;  Service: Gastroenterology;  Laterality: N/A;   ENTEROSCOPY N/A 10/09/2020   Procedure: ENTEROSCOPY;  Surgeon: Abran Norleen SAILOR, MD;  Location: [facility] ENDOSCOPY;  Service: Endoscopy;  Laterality: N/A;   ENTEROSCOPY N/A 03/22/2022   Procedure: ENTEROSCOPY;  Surgeon: Leigh Elspeth SQUIBB, MD;  Location: [facility] ENDOSCOPY;  Service: Gastroenterology;  Laterality: N/A;   ENTEROSCOPY N/A 05/20/2023   Procedure: ENTEROSCOPY;  Surgeon: Legrand Victory LITTIE DOUGLAS, MD;  Location: [facility] ENDOSCOPY;  Service: Gastroenterology;  Laterality: N/A;   ENTEROSCOPY N/A 08/24/2023   Procedure: ENTEROSCOPY;  Surgeon: Suzann [name] HERO, MD;  Location: [facility] ENDOSCOPY;  Service: Gastroenterology;  Laterality: N/A;   ESOPHAGOGASTRODUODENOSCOPY N/A 11/12/2020   Procedure: ESOPHAGOGASTRODUODENOSCOPY (EGD);  Surgeon: Wilhelmenia Aloha Raddle., MD;  Location: [facility]COPY;  Service: Gastroenterology;  Laterality: N/A;   ESOPHAGOGASTRODUODENOSCOPY (EGD) WITH PROPOFOL  N/A 09/05/2018   Procedure: ESOPHAGOGASTRODUODENOSCOPY (EGD) WITH PROPOFOL ;  Surgeon: Leigh Elspeth SQUIBB, MD;  Location: [facility]  ENDOSCOPY;  Service: Gastroenterology;  Laterality: N/A;   ESOPHAGOGASTRODUODENOSCOPY (EGD) WITH PROPOFOL  N/A 11/19/2020   Procedure: ESOPHAGOGASTRODUODENOSCOPY (EGD) WITH PROPOFOL ;  Surgeon: Albertus Gordy HERO, MD;  [facility];  Service: Gastroenterology;  Laterality: N/A;   ESOPHAGOGASTRODUODENOSCOPY (EGD) WITH PROPOFOL  N/A 12/27/2022   Procedure: ESOPHAGOGASTRODUODENOSCOPY (EGD) WITH PROPOFOL ;  Surgeon: Shila Gustav GAILS, MD;  [facility];  Service: Gastroenterology;  Laterality: N/A;   EXCISION MASS HEAD     GIVENS CAPSULE  STUDY N/A 09/06/2018   Procedure: GIVENS CAPSULE STUDY;  Surgeon: Eda Iha, MD;  Location: [facility] ENDOSCOPY;  Service: Gastroenterology;  Laterality: N/A;   GIVENS CAPSULE STUDY N/A 09/26/2018   Procedure: GIVENS CAPSULE STUDY;  Surgeon: Albertus Gordy HERO, MD;  Location: [facility] ENDOSCOPY;  Service: Gastroenterology;  Laterality: N/A;   GIVENS CAPSULE STUDY N/A 06/14/2019   Procedure: GIVENS CAPSULE STUDY;  Surgeon: Shila Gustav GAILS, MD;  [facility];  Service: Endoscopy;  Laterality: N/A;   HEMOSTASIS CLIP PLACEMENT  11/12/2020   Procedure: HEMOSTASIS CLIP PLACEMENT;  Surgeon: Wilhelmenia Aloha Raddle., MD;  Location: [facility]OSCOPY;  Service: Gastroenterology;;   HEMOSTASIS CONTROL  11/12/2020   Procedure: HEMOSTASIS CONTROL;  Surgeon: Wilhelmenia Aloha Raddle., MD;  Location: [facility]OSCOPY;  Service: Gastroenterology;;   HOT HEMOSTASIS N/A 10/28/2018   Procedure: HOT HEMOSTASIS (ARGON PLASMA COAGULATION/BICAP);  Surgeon: Eda Iha, MD;  Location[facility] ENDOSCOPY;  Service: Gastroenterology;  Laterality: N/A;   HOT HEMOSTASIS N/A 11/12/2020   Procedure: HOT HEMOSTASIS (ARGON PLASMA COAGULATION/BICAP);  Surgeon: Wilhelmenia Aloha Raddle., MD;  Location: [facility];  Service: Gastroenterology;  Laterality: N/A;   HOT HEMOSTASIS N/A 03/22/2022   Procedure: HOT HEMOSTASIS (ARGON PLASMA COAGULATION/BICAP);  Surgeon: Leigh Elspeth SQUIBB, MD;  Location: [facility] ENDOSCOPY;  Service: Gastroenterology;  Laterality: N/A;   HOT HEMOSTASIS N/A 05/20/2023   Procedure: HOT HEMOSTASIS (ARGON PLASMA COAGULATION/BICAP);  Surgeon: Legrand Victory LITTIE DOUGLAS, MD;  Location: [facility] ENDOSCOPY;  Service: Gastroenterology;  Laterality: N/A;   HOT HEMOSTASIS N/A 08/24/2023   Procedure: EGD, WITH ARGON PLASMA COAGULATION;  Surgeon: Suzann [name] HERO, MD;  Location: [facility] ENDOSCOPY;  Service: Gastroenterology;  Laterality: N/A;   INCISION AND DRAINAGE PERIRECTAL ABSCESS N/A 06/05/2017   Procedure: IRRIGATION AND DEBRIDEMENT PERIRECTAL ABSCESS;   Surgeon: Teresa Lonni HERO, MD;  [facility];  Service: General;  Laterality: N/A;   IR CATHETER TUBE CHANGE  05/14/2023   LAPAROSCOPIC APPENDECTOMY N/A 05/01/2023   Procedure: APPENDECTOMY LAPAROSCOPIC;  Surgeon: Ebbie Cough, MD;  Location: [facility] OR;  Service: General;  Laterality: N/A;   PERIPHERAL VASCULAR BALLOON ANGIOPLASTY Left 08/02/2023   Procedure: PERIPHERAL VASCULAR BALLOON ANGIOPLASTY;  Surgeon: Magda Debby SAILOR, MD;  [facility];  Service: Cardiovascular;  Laterality: Left;   PERIPHERAL VASCULAR INTERVENTION  12/07/2022   Procedure: PERIPHERAL VASCULAR INTERVENTION;  Surgeon: Magda Debby SAILOR, MD;  [facility];  Service: Cardiovascular;;   PERIPHERAL VASCULAR INTERVENTION Left 08/02/2023   Procedure: PERIPHERAL VASCULAR INTERVENTION;  Surgeon: Magda Debby SAILOR, MD;  [facility];  Service: Cardiovascular;  Laterality: Left;   SUBMUCOSAL TATTOO INJECTION  11/12/2020   Procedure: SUBMUCOSAL TATTOO INJECTION;  Surgeon: Wilhelmenia Aloha Raddle., MD;  Location: [facility];  Service: Gastroenterology;;   SUBMUCOSAL TATTOO INJECTION  05/20/2023   Procedure: SUBMUCOSAL TATTOO INJECTION;  Surgeon: Legrand Victory LITTIE DOUGLAS, MD;  Location: [facility] ENDOSCOPY;  Service: Gastroenterology;;   VIDEO BRONCHOSCOPY Bilateral 05/08/2016   Procedure: VIDEO BRONCHOSCOPY WITH FLUORO;  Surgeon: Harden GAILS Staff, MD;  Location[facility];  Service: Cardiopulmonary;  Laterality: Bilateral;    I have reviewed the social history and family history with the patient and they are unchanged from previous note.  ALLERGIES:  is allergic to lisinopril .  MEDICATIONS:  Current Outpatient Medications  Medication Sig Dispense Refill   acetaminophen  (TYLENOL ) 500 MG tablet Take 2 tablets (1,000 mg total) by mouth every 8 (eight) hours as needed for moderate pain. 100 tablet 1   albuterol  (PROVENTIL ) (2.5 MG/3ML) 0.083% nebulizer solution Take 3 mLs (2.5 mg total) by  nebulization every 2 (two) hours as needed for wheezing or shortness of breath. 75 mL 1   albuterol  (VENTOLIN  HFA) 108 (90 Base) MCG/ACT inhaler INHALE 2 PUFFS INTO THE LUNGS EVERY 6 (SIX) HOURS AS NEEDED FOR WHEEZING OR SHORTNESS OF BREATH. 18 g 6   aspirin  EC 81 MG tablet Take 81 mg by mouth daily. Swallow whole.     atorvastatin  (LIPITOR ) 80 MG tablet Take 1 tablet (80 mg total) by mouth daily. 90 tablet 1   budesonide -glycopyrrolate -formoterol  (BREZTRI  AEROSPHERE) 160-9-4.8 MCG/ACT AERO inhaler Take 2 puffs first thing in morning and then another 2 puffs about 12 hours later. 10.7 g 11   DULoxetine  (CYMBALTA ) 60 MG capsule Take 1 capsule (60 mg total) by mouth daily. For chronic leg pains 90 capsule 1   ergocalciferol  (DRISDOL ) 1.25 MG (50000 UT) capsule Take 1 capsule (50,000 Units total) by mouth once a week. 12 capsule 1   ezetimibe  (ZETIA ) 10 MG tablet Take 1 tablet (10 mg total) by mouth daily. 90 tablet 2   ferrous sulfate  (FEROSUL) 325 (65 FE) MG tablet Take 1 tablet (325 mg total) by mouth 2 (two) times daily with a meal. 180 tablet 1   folic acid  (FOLVITE ) 1 MG tablet Take 1 tablet (1 mg total) by mouth daily. 90 tablet 1   furosemide  (LASIX ) 40 MG tablet Take 1 tablet (40 mg total) by mouth 2 (two) times daily. 180 tablet 1   gabapentin  (NEURONTIN ) 300 MG capsule Take 1 capsule (300 mg total) by mouth at bedtime. For leg pains 90 capsule 1   hydrALAZINE  (APRESOLINE ) 25 MG tablet Take 1 tablet (25 mg total) by mouth every 8 (eight) hours. 90 tablet 5   isosorbide  mononitrate (IMDUR ) 30 MG 24 hr tablet Take 1 tablet (30 mg total) by mouth daily. 90 tablet 1   metoprolol  succinate (TOPROL -XL) 50 MG 24 hr tablet Take 1 tablet (50 mg total) by mouth daily. 90 tablet 1   Multiple Vitamin (MULTIVITAMIN WITH MINERALS) TABS tablet Take 1 tablet by mouth daily.     nitroGLYCERIN  (NITROSTAT ) 0.4 MG SL tablet Place 0.4 mg under the tongue every 5 (five) minutes as needed for chest pain.     OXYGEN   Inhale 2 L/min into the lungs continuous. (Patient taking differently: Inhale 3 L/min into the lungs continuous.)     pantoprazole  (PROTONIX ) 40 MG tablet Take 1 tablet (40 mg total) by mouth daily. 90 tablet 1   rivaroxaban  (XARELTO ) 20 MG TABS tablet Take 1 tablet (20 mg total) by mouth daily with supper. 90 tablet 1   No current facility-administered medications for this visit.    PHYSICAL EXAMINATION: ECOG PERFORMANCE STATUS: 3 - Symptomatic, >50% confined to bed  Vitals:   01/23/24 0937  BP: 130/70  Pulse: 97  Resp: 18  Temp: 98.6 F (37 C)  SpO2: 99%   Wt Readings from Last 3 Encounters:  01/23/24 201 lb 11.2 oz (91.5 kg)  01/17/24 201 lb 2 oz (  91.2 kg)  01/10/24 204 lb 14.4 oz (92.9 kg)     GENERAL:alert, no distress and comfortable, in wheelchair with nasal cannula oxygen  SKIN: skin color, texture, turgor are normal, no rashes or significant lesions EYES: normal, Conjunctiva are pink and non-injected, sclera clear Musculoskeletal:no cyanosis of digits and no clubbing  NEURO: alert & oriented x 3 with fluent speech, no focal motor/sensory deficits  Physical Exam MEASUREMENTS: Weight- 201.  LABORATORY DATA:  I have reviewed the data as listed    Latest Ref Rng & Units 01/23/2024    9:16 AM 01/10/2024   10:31 AM 12/26/2023    7:47 AM  CBC  WBC 4.0 - 10.5 K/uL 4.4  5.3  6.6   Hemoglobin 13.0 - 17.0 g/dL 88.3  89.8  8.0   Hematocrit 39.0 - 52.0 % 41.8  36.2  28.9   Platelets 150 - 400 K/uL 171  259  270         Latest Ref Rng & Units 11/19/2023    8:44 AM 11/13/2023    5:10 PM 08/26/2023    6:15 AM  CMP  Glucose 70 - 99 mg/dL 886  808  896   BUN 8 - 23 mg/dL 12  12  13    Creatinine 0.61 - 1.24 mg/dL 9.19  9.14  9.24   Sodium 135 - 145 mmol/L 142  140  140   Potassium 3.5 - 5.1 mmol/L 3.7  3.5  4.5   Chloride 98 - 111 mmol/L 97  96  101   CO2 22 - 32 mmol/L  35  35   Calcium  8.9 - 10.3 mg/dL  8.5  8.6       RADIOGRAPHIC STUDIES: I have personally reviewed  the radiological images as listed and agreed with the findings in the report. No results found.    No orders of the defined types were placed in this encounter.  All questions were answered. The patient knows to call the clinic with any problems, questions or concerns. No barriers to learning was detected. The total time spent in the appointment was 25 minutes, including review of chart and various tests results, discussions about plan of care and coordination of care plan     Onita Mattock, MD 01/23/2024

## 2024-01-23 NOTE — Assessment & Plan Note (Addendum)
 secondary to h/o GI bleed, PUD, and inadequate iron  replacement -First found to have iron  deficiency 09/05/2018 with serum iron  9, 3% saturation, TIBC 358, and ferritin of 3 with a hemoglobin of 4.9, s/p blood transfusion  -EGD showed esophagitis, colonoscopy and small bowel endoscopy were unremarkable. Anemia improved -Developed acute on chronic anemia 10/2018, small bowel endoscopy  -Began receiving IV iron  at that time (Feraheme  510 mg x1 episodically), and been on iv venofer  lately, responded well  -Capsule endoscopy 06/18/19 showed gastritis, no AVMs or bleeding.  -continue iv iron  as needed, if ferrintin<200

## 2024-01-23 NOTE — Patient Instructions (Signed)
 [facility] - A DEPT OF [facility]  Discharge Instructions: Thank you for choosing [facility] to provide your oncology and hematology care.   If you have a lab appointment with the Cancer Center, please go directly to the Cancer Center and check in at the registration area.   Wear comfortable clothing and clothing appropriate for easy access to any Portacath or PICC line.   We strive to give you quality time with your provider. You may need to reschedule your appointment if you arrive late (15 or more minutes).  Arriving late affects you and other patients whose appointments are after yours.  Also, if you miss three or more appointments without notifying the office, you may be dismissed from the clinic at the provider's discretion.      For prescription refill requests, have your pharmacy contact our office and allow 72 hours for refills to be completed.    Today you received the following chemotherapy and/or immunotherapy agents mvasi  and iron       To help prevent nausea and vomiting after your treatment, we encourage you to take your nausea medication as directed.  BELOW ARE SYMPTOMS THAT SHOULD BE REPORTED IMMEDIATELY: *FEVER GREATER THAN 100.4 F (38 C) OR HIGHER *CHILLS OR SWEATING *NAUSEA AND VOMITING THAT IS NOT CONTROLLED WITH YOUR NAUSEA MEDICATION *UNUSUAL SHORTNESS OF BREATH *UNUSUAL BRUISING OR BLEEDING *URINARY PROBLEMS (pain or burning when urinating, or frequent urination) *BOWEL PROBLEMS (unusual diarrhea, constipation, pain near the anus) TENDERNESS IN MOUTH AND THROAT WITH OR WITHOUT PRESENCE OF ULCERS (sore throat, sores in mouth, or a toothache) UNUSUAL RASH, SWELLING OR PAIN  UNUSUAL VAGINAL DISCHARGE OR ITCHING   Items with * indicate a potential emergency and should be followed up as soon as possible or go to the Emergency Department if any problems should occur.  Please show the CHEMOTHERAPY ALERT CARD or  IMMUNOTHERAPY ALERT CARD at check-in to the Emergency Department and triage nurse.  Should you have questions after your visit or need to cancel or reschedule your appointment, please contact [facility] - A DEPT OF [facility]  Dept: [phone]  and follow the prompts.  Office hours are 8:00 a.m. to 4:30 p.m. Monday - Friday. Please note that voicemails left after 4:00 p.m. may not be returned until the following business day.  We are closed weekends and major holidays. You have access to a nurse at all times for urgent questions. Please call the main number to the clinic Dept: [phone] and follow the prompts.   For any non-urgent questions, you may also contact your provider using MyChart. We now offer e-Visits for anyone 20 and older to request care online for non-urgent symptoms. For details visit [email or web address].   Also download the MyChart app! Go to the app store, search MyChart, open the app, select [facility], and log in with your MyChart username and password.

## 2024-01-24 ENCOUNTER — Other Ambulatory Visit: Payer: Self-pay | Admitting: Hematology

## 2024-01-27 ENCOUNTER — Other Ambulatory Visit: Payer: Self-pay | Admitting: Acute Care

## 2024-01-27 DIAGNOSIS — Z122 Encounter for screening for malignant neoplasm of respiratory organs: Secondary | ICD-10-CM

## 2024-01-27 DIAGNOSIS — Z87891 Personal history of nicotine dependence: Secondary | ICD-10-CM

## 2024-02-04 ENCOUNTER — Ambulatory Visit
Admission: RE | Admit: 2024-02-04 | Discharge: 2024-02-04 | Disposition: A | Discharge status: Home / Self Care | Source: Ambulatory Visit

## 2024-02-04 DIAGNOSIS — Z122 Encounter for screening for malignant neoplasm of respiratory organs: Secondary | ICD-10-CM

## 2024-02-04 DIAGNOSIS — F17210 Nicotine dependence, cigarettes, uncomplicated: Secondary | ICD-10-CM | POA: Diagnosis not present

## 2024-02-04 DIAGNOSIS — Z87891 Personal history of nicotine dependence: Secondary | ICD-10-CM

## 2024-02-06 ENCOUNTER — Other Ambulatory Visit: Payer: Self-pay

## 2024-02-06 ENCOUNTER — Other Ambulatory Visit: Payer: Self-pay | Admitting: Hematology

## 2024-02-06 ENCOUNTER — Inpatient Hospital Stay

## 2024-02-06 VITALS — BP 155/86 | HR 91 | Temp 97.8°F | Resp 18

## 2024-02-06 DIAGNOSIS — Z8249 Family history of ischemic heart disease and other diseases of the circulatory system: Secondary | ICD-10-CM | POA: Diagnosis not present

## 2024-02-06 DIAGNOSIS — I480 Paroxysmal atrial fibrillation: Secondary | ICD-10-CM | POA: Diagnosis present

## 2024-02-06 DIAGNOSIS — J9612 Chronic respiratory failure with hypercapnia: Secondary | ICD-10-CM | POA: Diagnosis not present

## 2024-02-06 DIAGNOSIS — I471 Supraventricular tachycardia, unspecified: Secondary | ICD-10-CM | POA: Diagnosis present

## 2024-02-06 DIAGNOSIS — J441 Chronic obstructive pulmonary disease with (acute) exacerbation: Secondary | ICD-10-CM | POA: Diagnosis present

## 2024-02-06 DIAGNOSIS — E1151 Type 2 diabetes mellitus with diabetic peripheral angiopathy without gangrene: Secondary | ICD-10-CM | POA: Diagnosis present

## 2024-02-06 DIAGNOSIS — R0609 Other forms of dyspnea: Secondary | ICD-10-CM | POA: Diagnosis not present

## 2024-02-06 DIAGNOSIS — I110 Hypertensive heart disease with heart failure: Secondary | ICD-10-CM | POA: Diagnosis present

## 2024-02-06 DIAGNOSIS — K31819 Angiodysplasia of stomach and duodenum without bleeding: Secondary | ICD-10-CM

## 2024-02-06 DIAGNOSIS — I5032 Chronic diastolic (congestive) heart failure: Secondary | ICD-10-CM | POA: Diagnosis not present

## 2024-02-06 DIAGNOSIS — Z7951 Long term (current) use of inhaled steroids: Secondary | ICD-10-CM | POA: Diagnosis not present

## 2024-02-06 DIAGNOSIS — F17210 Nicotine dependence, cigarettes, uncomplicated: Secondary | ICD-10-CM | POA: Diagnosis present

## 2024-02-06 DIAGNOSIS — E1169 Type 2 diabetes mellitus with other specified complication: Secondary | ICD-10-CM | POA: Diagnosis not present

## 2024-02-06 DIAGNOSIS — E1165 Type 2 diabetes mellitus with hyperglycemia: Secondary | ICD-10-CM | POA: Diagnosis present

## 2024-02-06 DIAGNOSIS — Z7952 Long term (current) use of systemic steroids: Secondary | ICD-10-CM | POA: Diagnosis not present

## 2024-02-06 DIAGNOSIS — J9601 Acute respiratory failure with hypoxia: Secondary | ICD-10-CM | POA: Diagnosis not present

## 2024-02-06 DIAGNOSIS — I4891 Unspecified atrial fibrillation: Secondary | ICD-10-CM | POA: Diagnosis not present

## 2024-02-06 DIAGNOSIS — E1142 Type 2 diabetes mellitus with diabetic polyneuropathy: Secondary | ICD-10-CM | POA: Diagnosis not present

## 2024-02-06 DIAGNOSIS — E785 Hyperlipidemia, unspecified: Secondary | ICD-10-CM | POA: Diagnosis present

## 2024-02-06 DIAGNOSIS — I428 Other cardiomyopathies: Secondary | ICD-10-CM | POA: Diagnosis present

## 2024-02-06 DIAGNOSIS — Z23 Encounter for immunization: Secondary | ICD-10-CM | POA: Diagnosis present

## 2024-02-06 DIAGNOSIS — I2510 Atherosclerotic heart disease of native coronary artery without angina pectoris: Secondary | ICD-10-CM | POA: Diagnosis present

## 2024-02-06 DIAGNOSIS — I2489 Other forms of acute ischemic heart disease: Secondary | ICD-10-CM | POA: Diagnosis present

## 2024-02-06 DIAGNOSIS — D500 Iron deficiency anemia secondary to blood loss (chronic): Secondary | ICD-10-CM

## 2024-02-06 DIAGNOSIS — I10 Essential (primary) hypertension: Secondary | ICD-10-CM | POA: Diagnosis not present

## 2024-02-06 DIAGNOSIS — Z7901 Long term (current) use of anticoagulants: Secondary | ICD-10-CM | POA: Diagnosis not present

## 2024-02-06 DIAGNOSIS — Z720 Tobacco use: Secondary | ICD-10-CM | POA: Diagnosis not present

## 2024-02-06 DIAGNOSIS — E8729 Other acidosis: Secondary | ICD-10-CM | POA: Diagnosis present

## 2024-02-06 DIAGNOSIS — J9622 Acute and chronic respiratory failure with hypercapnia: Secondary | ICD-10-CM | POA: Diagnosis present

## 2024-02-06 DIAGNOSIS — J9611 Chronic respiratory failure with hypoxia: Secondary | ICD-10-CM | POA: Diagnosis not present

## 2024-02-06 DIAGNOSIS — I739 Peripheral vascular disease, unspecified: Secondary | ICD-10-CM | POA: Diagnosis not present

## 2024-02-06 DIAGNOSIS — I4719 Other supraventricular tachycardia: Secondary | ICD-10-CM | POA: Diagnosis present

## 2024-02-06 DIAGNOSIS — J449 Chronic obstructive pulmonary disease, unspecified: Secondary | ICD-10-CM | POA: Diagnosis not present

## 2024-02-06 DIAGNOSIS — D509 Iron deficiency anemia, unspecified: Secondary | ICD-10-CM | POA: Diagnosis present

## 2024-02-06 DIAGNOSIS — J9621 Acute and chronic respiratory failure with hypoxia: Secondary | ICD-10-CM | POA: Diagnosis not present

## 2024-02-06 DIAGNOSIS — Z8719 Personal history of other diseases of the digestive system: Secondary | ICD-10-CM | POA: Diagnosis not present

## 2024-02-06 DIAGNOSIS — G629 Polyneuropathy, unspecified: Secondary | ICD-10-CM | POA: Diagnosis present

## 2024-02-06 DIAGNOSIS — R918 Other nonspecific abnormal finding of lung field: Secondary | ICD-10-CM | POA: Diagnosis not present

## 2024-02-06 DIAGNOSIS — D649 Anemia, unspecified: Secondary | ICD-10-CM

## 2024-02-06 DIAGNOSIS — I5043 Acute on chronic combined systolic (congestive) and diastolic (congestive) heart failure: Secondary | ICD-10-CM | POA: Diagnosis present

## 2024-02-06 DIAGNOSIS — Z9981 Dependence on supplemental oxygen: Secondary | ICD-10-CM | POA: Diagnosis not present

## 2024-02-06 DIAGNOSIS — I3139 Other pericardial effusion (noninflammatory): Secondary | ICD-10-CM | POA: Diagnosis present

## 2024-02-06 DIAGNOSIS — Z79899 Other long term (current) drug therapy: Secondary | ICD-10-CM | POA: Diagnosis not present

## 2024-02-06 DIAGNOSIS — K2100 Gastro-esophageal reflux disease with esophagitis, without bleeding: Secondary | ICD-10-CM | POA: Diagnosis not present

## 2024-02-06 LAB — CBC WITH DIFFERENTIAL (CANCER CENTER ONLY)
Abs Immature Granulocytes: 0.01 K/uL (ref 0.00–0.07)
Basophils Absolute: 0 K/uL (ref 0.0–0.1)
Basophils Relative: 0 %
Eosinophils Absolute: 0.2 K/uL (ref 0.0–0.5)
Eosinophils Relative: 3 %
HCT: 41.7 % (ref 39.0–52.0)
Hemoglobin: 11.5 g/dL — ABNORMAL LOW (ref 13.0–17.0)
Immature Granulocytes: 0 %
Lymphocytes Relative: 20 %
Lymphs Abs: 1.2 K/uL (ref 0.7–4.0)
MCH: 25.3 pg — ABNORMAL LOW (ref 26.0–34.0)
MCHC: 27.6 g/dL — ABNORMAL LOW (ref 30.0–36.0)
MCV: 91.9 fL (ref 80.0–100.0)
Monocytes Absolute: 0.5 K/uL (ref 0.1–1.0)
Monocytes Relative: 9 %
Neutro Abs: 4.1 K/uL (ref 1.7–7.7)
Neutrophils Relative %: 68 %
Platelet Count: 162 K/uL (ref 150–400)
RBC: 4.54 MIL/uL (ref 4.22–5.81)
RDW: 14.3 % (ref 11.5–15.5)
WBC Count: 6 K/uL (ref 4.0–10.5)
nRBC: 0 % (ref 0.0–0.2)

## 2024-02-06 LAB — IRON AND IRON BINDING CAPACITY (CC-WL,HP ONLY)
Iron: 38 ug/dL — ABNORMAL LOW (ref 45–182)
Saturation Ratios: 14 % — ABNORMAL LOW (ref 17.9–39.5)
TIBC: 267 ug/dL (ref 250–450)
UIBC: 229 ug/dL (ref 117–376)

## 2024-02-06 LAB — SAMPLE TO BLOOD BANK

## 2024-02-06 LAB — FERRITIN: Ferritin: 114 ng/mL (ref 24–336)

## 2024-02-06 LAB — TOTAL PROTEIN, URINE DIPSTICK: Protein, ur: 100 mg/dL — AB

## 2024-02-06 MED ORDER — SODIUM CHLORIDE 0.9 % IV SOLN: INTRAVENOUS | Status: DC

## 2024-02-06 MED ORDER — SODIUM CHLORIDE 0.9 % IV SOLN
5.0000 mg/kg | Freq: Once | INTRAVENOUS | Status: AC
  Administered 2024-02-06: 450 mg via INTRAVENOUS
  Filled 2024-02-06: qty 4

## 2024-02-06 NOTE — Progress Notes (Signed)
 Urine protein 100. Dr. Lanny notified. OK to proceed with bevacizumab  today.  Ferritin 109 on 8/14 - Iron  infusion added tomorrow at 9:30 Patient and his sister aware of this appt.

## 2024-02-06 NOTE — Patient Instructions (Signed)
 [facility] - A DEPT OF [facility]  Discharge Instructions: Thank you for choosing [facility] to provide your oncology and hematology care.   If you have a lab appointment with the Cancer Center, please go directly to the Cancer Center and check in at the registration area.   Wear comfortable clothing and clothing appropriate for easy access to any Portacath or PICC line.   We strive to give you quality time with your provider. You may need to reschedule your appointment if you arrive late (15 or more minutes).  Arriving late affects you and other patients whose appointments are after yours.  Also, if you miss three or more appointments without notifying the office, you may be dismissed from the clinic at the provider's discretion.      For prescription refill requests, have your pharmacy contact our office and allow 72 hours for refills to be completed.    Today you received the following immunotherapy agents Bevacizumab    To help prevent nausea and vomiting after your treatment, we encourage you to take your nausea medication as directed.  BELOW ARE SYMPTOMS THAT SHOULD BE REPORTED IMMEDIATELY: *FEVER GREATER THAN 100.4 F (38 C) OR HIGHER *CHILLS OR SWEATING *NAUSEA AND VOMITING THAT IS NOT CONTROLLED WITH YOUR NAUSEA MEDICATION *UNUSUAL SHORTNESS OF BREATH *UNUSUAL BRUISING OR BLEEDING *URINARY PROBLEMS (pain or burning when urinating, or frequent urination) *BOWEL PROBLEMS (unusual diarrhea, constipation, pain near the anus) TENDERNESS IN MOUTH AND THROAT WITH OR WITHOUT PRESENCE OF ULCERS (sore throat, sores in mouth, or a toothache) UNUSUAL RASH, SWELLING OR PAIN  UNUSUAL VAGINAL DISCHARGE OR ITCHING   Items with * indicate a potential emergency and should be followed up as soon as possible or go to the Emergency Department if any problems should occur.  Please show the CHEMOTHERAPY ALERT CARD or IMMUNOTHERAPY ALERT CARD at check-in  to the Emergency Department and triage nurse.  Should you have questions after your visit or need to cancel or reschedule your appointment, please contact [facility] - A DEPT OF [facility]  Dept: [phone]  and follow the prompts.  Office hours are 8:00 a.m. to 4:30 p.m. Monday - Friday. Please note that voicemails left after 4:00 p.m. may not be returned until the following business day.  We are closed weekends and major holidays. You have access to a nurse at all times for urgent questions. Please call the main number to the clinic Dept: [phone] and follow the prompts.   For any non-urgent questions, you may also contact your provider using MyChart. We now offer e-Visits for anyone 31 and older to request care online for non-urgent symptoms. For details visit [email or web address].   Also download the MyChart app! Go to the app store, search MyChart, open the app, select [place], and log in with your MyChart username and password.  Iron  Sucrose Injection What is this medication? IRON  SUCROSE (EYE ern SOO krose) treats low levels of iron  (iron  deficiency anemia) in people with kidney disease. Iron  is a mineral that plays an important role in making red blood cells, which carry oxygen  from your lungs to the rest of your body. This medicine may be used for other purposes; ask your health care provider or pharmacist if you have questions. COMMON BRAND NAME(S): Venofer  What should I tell my care team before I take this medication? They need to know if you have any of these  conditions: Anemia not caused by low iron  levels Heart disease High levels of iron  in the blood Kidney disease Liver disease An unusual or allergic reaction to iron , other medications, foods, dyes, or preservatives Pregnant or trying to get pregnant Breastfeeding How should I use this medication? This medication is infused into a vein. It is given by your care team in a  hospital or clinic setting. Talk to your care team about the use of this medication in children. While it may be prescribed for children as young as 2 years for selected conditions, precautions do apply. Overdosage: If you think you have taken too much of this medicine contact a poison control center or emergency room at once. NOTE: This medicine is only for you. Do not share this medicine with others. What if I miss a dose? Keep appointments for follow-up doses. It is important not to miss your dose. Call your care team if you are unable to keep an appointment. What may interact with this medication? Do not take this medication with any of the following: Deferoxamine Dimercaprol  Other iron  products This medication may also interact with the following: Chloramphenicol Deferasirox This list may not describe all possible interactions. Give your health care provider a list of all the medicines, herbs, non-prescription drugs, or dietary supplements you use. Also tell them if you smoke, drink alcohol, or use illegal drugs. Some items may interact with your medicine. What should I watch for while using this medication? Visit your care team for regular checks on your progress. Tell your care team if your symptoms do not start to get better or if they get worse. You may need blood work done while you are taking this medication. You may need to eat more foods that contain iron . Talk to your care team. Foods that contain iron  include whole grains or cereals, dried fruits, beans, peas, leafy green vegetables, and organ meats (liver, kidney). What side effects may I notice from receiving this medication? Side effects that you should report to your care team as soon as possible: Allergic reactions--skin rash, itching, hives, swelling of the face, lips, tongue, or throat Low blood pressure--dizziness, feeling faint or lightheaded, blurry vision Shortness of breath Side effects that usually do not require  medical attention (report to your care team if they continue or are bothersome): Flushing Headache Joint pain Muscle pain Nausea Pain, redness, or irritation at injection site This list may not describe all possible side effects. Call your doctor for medical advice about side effects. You may report side effects to FDA at 1-800-FDA-1088. Where should I keep my medication? This medication is given in a hospital or clinic. It will not be stored at home. NOTE: This sheet is a summary. It may not cover all possible information. If you have questions about this medicine, talk to your doctor, pharmacist, or health care provider.  2024 Elsevier/Gold Standard (2023-01-16 00:00:00)

## 2024-02-07 ENCOUNTER — Inpatient Hospital Stay

## 2024-02-07 VITALS — BP 146/92 | HR 85 | Temp 98.4°F | Resp 18

## 2024-02-07 DIAGNOSIS — D500 Iron deficiency anemia secondary to blood loss (chronic): Secondary | ICD-10-CM

## 2024-02-07 MED ORDER — IRON SUCROSE 300 MG IVPB - SIMPLE MED
300.0000 mg | Freq: Once | Status: AC
  Administered 2024-02-07: 300 mg via INTRAVENOUS
  Filled 2024-02-07: qty 200

## 2024-02-07 NOTE — Patient Instructions (Signed)

## 2024-02-09 ENCOUNTER — Emergency Department (HOSPITAL_COMMUNITY)

## 2024-02-09 ENCOUNTER — Other Ambulatory Visit: Payer: Self-pay

## 2024-02-09 ENCOUNTER — Inpatient Hospital Stay (HOSPITAL_COMMUNITY)
Admission: EM | Admit: 2024-02-09 | Discharge: 2024-02-13 | DRG: 189 | Disposition: A | Discharge status: Home / Self Care | Attending: Internal Medicine | Admitting: Internal Medicine

## 2024-02-09 ENCOUNTER — Encounter (HOSPITAL_COMMUNITY): Payer: Self-pay

## 2024-02-09 DIAGNOSIS — I4719 Other supraventricular tachycardia: Secondary | ICD-10-CM | POA: Diagnosis present

## 2024-02-09 DIAGNOSIS — J9611 Chronic respiratory failure with hypoxia: Secondary | ICD-10-CM | POA: Diagnosis present

## 2024-02-09 DIAGNOSIS — Z800 Family history of malignant neoplasm of digestive organs: Secondary | ICD-10-CM

## 2024-02-09 DIAGNOSIS — J9601 Acute respiratory failure with hypoxia: Secondary | ICD-10-CM | POA: Diagnosis not present

## 2024-02-09 DIAGNOSIS — Z833 Family history of diabetes mellitus: Secondary | ICD-10-CM

## 2024-02-09 DIAGNOSIS — Z888 Allergy status to other drugs, medicaments and biological substances status: Secondary | ICD-10-CM

## 2024-02-09 DIAGNOSIS — I480 Paroxysmal atrial fibrillation: Secondary | ICD-10-CM | POA: Diagnosis present

## 2024-02-09 DIAGNOSIS — E785 Hyperlipidemia, unspecified: Secondary | ICD-10-CM | POA: Diagnosis present

## 2024-02-09 DIAGNOSIS — R0600 Dyspnea, unspecified: Secondary | ICD-10-CM | POA: Diagnosis not present

## 2024-02-09 DIAGNOSIS — Z9842 Cataract extraction status, left eye: Secondary | ICD-10-CM

## 2024-02-09 DIAGNOSIS — D509 Iron deficiency anemia, unspecified: Secondary | ICD-10-CM | POA: Diagnosis present

## 2024-02-09 DIAGNOSIS — R000 Tachycardia, unspecified: Secondary | ICD-10-CM | POA: Diagnosis not present

## 2024-02-09 DIAGNOSIS — I2489 Other forms of acute ischemic heart disease: Secondary | ICD-10-CM | POA: Diagnosis not present

## 2024-02-09 DIAGNOSIS — E1142 Type 2 diabetes mellitus with diabetic polyneuropathy: Secondary | ICD-10-CM | POA: Diagnosis not present

## 2024-02-09 DIAGNOSIS — J441 Chronic obstructive pulmonary disease with (acute) exacerbation: Secondary | ICD-10-CM | POA: Diagnosis not present

## 2024-02-09 DIAGNOSIS — E1165 Type 2 diabetes mellitus with hyperglycemia: Secondary | ICD-10-CM | POA: Diagnosis present

## 2024-02-09 DIAGNOSIS — I444 Left anterior fascicular block: Secondary | ICD-10-CM | POA: Diagnosis present

## 2024-02-09 DIAGNOSIS — E1151 Type 2 diabetes mellitus with diabetic peripheral angiopathy without gangrene: Secondary | ICD-10-CM | POA: Diagnosis present

## 2024-02-09 DIAGNOSIS — S2242XA Multiple fractures of ribs, left side, initial encounter for closed fracture: Secondary | ICD-10-CM | POA: Diagnosis not present

## 2024-02-09 DIAGNOSIS — I739 Peripheral vascular disease, unspecified: Secondary | ICD-10-CM | POA: Diagnosis present

## 2024-02-09 DIAGNOSIS — Z8249 Family history of ischemic heart disease and other diseases of the circulatory system: Secondary | ICD-10-CM | POA: Diagnosis not present

## 2024-02-09 DIAGNOSIS — Z720 Tobacco use: Secondary | ICD-10-CM | POA: Diagnosis not present

## 2024-02-09 DIAGNOSIS — R251 Tremor, unspecified: Secondary | ICD-10-CM | POA: Diagnosis present

## 2024-02-09 DIAGNOSIS — Z9981 Dependence on supplemental oxygen: Secondary | ICD-10-CM | POA: Diagnosis not present

## 2024-02-09 DIAGNOSIS — Z7951 Long term (current) use of inhaled steroids: Secondary | ICD-10-CM | POA: Diagnosis not present

## 2024-02-09 DIAGNOSIS — K2100 Gastro-esophageal reflux disease with esophagitis, without bleeding: Secondary | ICD-10-CM | POA: Diagnosis not present

## 2024-02-09 DIAGNOSIS — J449 Chronic obstructive pulmonary disease, unspecified: Secondary | ICD-10-CM | POA: Diagnosis not present

## 2024-02-09 DIAGNOSIS — Z7982 Long term (current) use of aspirin: Secondary | ICD-10-CM

## 2024-02-09 DIAGNOSIS — I5043 Acute on chronic combined systolic (congestive) and diastolic (congestive) heart failure: Secondary | ICD-10-CM | POA: Diagnosis not present

## 2024-02-09 DIAGNOSIS — I2510 Atherosclerotic heart disease of native coronary artery without angina pectoris: Secondary | ICD-10-CM | POA: Diagnosis present

## 2024-02-09 DIAGNOSIS — J9622 Acute and chronic respiratory failure with hypercapnia: Secondary | ICD-10-CM | POA: Diagnosis present

## 2024-02-09 DIAGNOSIS — I5032 Chronic diastolic (congestive) heart failure: Secondary | ICD-10-CM | POA: Diagnosis present

## 2024-02-09 DIAGNOSIS — F17210 Nicotine dependence, cigarettes, uncomplicated: Secondary | ICD-10-CM | POA: Diagnosis present

## 2024-02-09 DIAGNOSIS — Z23 Encounter for immunization: Secondary | ICD-10-CM

## 2024-02-09 DIAGNOSIS — Z79899 Other long term (current) drug therapy: Secondary | ICD-10-CM | POA: Diagnosis not present

## 2024-02-09 DIAGNOSIS — I428 Other cardiomyopathies: Secondary | ICD-10-CM | POA: Diagnosis present

## 2024-02-09 DIAGNOSIS — Z951 Presence of aortocoronary bypass graft: Secondary | ICD-10-CM

## 2024-02-09 DIAGNOSIS — I517 Cardiomegaly: Secondary | ICD-10-CM | POA: Diagnosis not present

## 2024-02-09 DIAGNOSIS — E1169 Type 2 diabetes mellitus with other specified complication: Secondary | ICD-10-CM | POA: Diagnosis not present

## 2024-02-09 DIAGNOSIS — I110 Hypertensive heart disease with heart failure: Secondary | ICD-10-CM | POA: Diagnosis present

## 2024-02-09 DIAGNOSIS — J9621 Acute and chronic respiratory failure with hypoxia: Secondary | ICD-10-CM | POA: Diagnosis not present

## 2024-02-09 DIAGNOSIS — E8729 Other acidosis: Secondary | ICD-10-CM | POA: Diagnosis not present

## 2024-02-09 DIAGNOSIS — G44209 Tension-type headache, unspecified, not intractable: Secondary | ICD-10-CM | POA: Diagnosis present

## 2024-02-09 DIAGNOSIS — I493 Ventricular premature depolarization: Secondary | ICD-10-CM | POA: Diagnosis present

## 2024-02-09 DIAGNOSIS — I3139 Other pericardial effusion (noninflammatory): Secondary | ICD-10-CM | POA: Diagnosis not present

## 2024-02-09 DIAGNOSIS — I471 Supraventricular tachycardia, unspecified: Secondary | ICD-10-CM

## 2024-02-09 DIAGNOSIS — I700 Atherosclerosis of aorta: Secondary | ICD-10-CM | POA: Diagnosis not present

## 2024-02-09 DIAGNOSIS — D72829 Elevated white blood cell count, unspecified: Secondary | ICD-10-CM | POA: Diagnosis present

## 2024-02-09 DIAGNOSIS — R0689 Other abnormalities of breathing: Secondary | ICD-10-CM

## 2024-02-09 DIAGNOSIS — J9612 Chronic respiratory failure with hypercapnia: Secondary | ICD-10-CM | POA: Diagnosis not present

## 2024-02-09 DIAGNOSIS — Z7901 Long term (current) use of anticoagulants: Secondary | ICD-10-CM

## 2024-02-09 DIAGNOSIS — E119 Type 2 diabetes mellitus without complications: Secondary | ICD-10-CM

## 2024-02-09 DIAGNOSIS — I10 Essential (primary) hypertension: Secondary | ICD-10-CM

## 2024-02-09 DIAGNOSIS — G629 Polyneuropathy, unspecified: Secondary | ICD-10-CM | POA: Diagnosis present

## 2024-02-09 DIAGNOSIS — I4891 Unspecified atrial fibrillation: Secondary | ICD-10-CM | POA: Diagnosis not present

## 2024-02-09 DIAGNOSIS — Z955 Presence of coronary angioplasty implant and graft: Secondary | ICD-10-CM

## 2024-02-09 DIAGNOSIS — R918 Other nonspecific abnormal finding of lung field: Secondary | ICD-10-CM | POA: Diagnosis not present

## 2024-02-09 DIAGNOSIS — D500 Iron deficiency anemia secondary to blood loss (chronic): Secondary | ICD-10-CM | POA: Diagnosis present

## 2024-02-09 DIAGNOSIS — I509 Heart failure, unspecified: Secondary | ICD-10-CM | POA: Diagnosis not present

## 2024-02-09 DIAGNOSIS — R0602 Shortness of breath: Secondary | ICD-10-CM | POA: Diagnosis not present

## 2024-02-09 DIAGNOSIS — Z8673 Personal history of transient ischemic attack (TIA), and cerebral infarction without residual deficits: Secondary | ICD-10-CM

## 2024-02-09 DIAGNOSIS — R0609 Other forms of dyspnea: Secondary | ICD-10-CM | POA: Diagnosis not present

## 2024-02-09 DIAGNOSIS — Z9841 Cataract extraction status, right eye: Secondary | ICD-10-CM

## 2024-02-09 DIAGNOSIS — Z5941 Food insecurity: Secondary | ICD-10-CM

## 2024-02-09 DIAGNOSIS — K219 Gastro-esophageal reflux disease without esophagitis: Secondary | ICD-10-CM | POA: Diagnosis present

## 2024-02-09 DIAGNOSIS — Z8719 Personal history of other diseases of the digestive system: Secondary | ICD-10-CM | POA: Diagnosis not present

## 2024-02-09 DIAGNOSIS — Z7952 Long term (current) use of systemic steroids: Secondary | ICD-10-CM | POA: Diagnosis not present

## 2024-02-09 LAB — BLOOD GAS, VENOUS
Acid-Base Excess: 16.4 mmol/L — ABNORMAL HIGH (ref 0.0–2.0)
Acid-Base Excess: 16.8 mmol/L — ABNORMAL HIGH (ref 0.0–2.0)
Bicarbonate: 46.4 mmol/L — ABNORMAL HIGH (ref 20.0–28.0)
Bicarbonate: 46.1 mmol/L — ABNORMAL HIGH (ref 20.0–28.0)
Drawn by: 5280
O2 Saturation: 92.3 %
O2 Saturation: 94 %
Patient temperature: 37
Patient temperature: 36.4
pCO2, Ven: 84 mmHg (ref 44–60)
pCO2, Ven: 76 mmHg (ref 44–60)
pH, Ven: 7.35 (ref 7.25–7.43)
pH, Ven: 7.39 (ref 7.25–7.43)
pO2, Ven: 61 mmHg — ABNORMAL HIGH (ref 32–45)
pO2, Ven: 61 mmHg — ABNORMAL HIGH (ref 32–45)

## 2024-02-09 LAB — BASIC METABOLIC PANEL WITH GFR
Anion gap: 10 (ref 5–15)
BUN: 6 mg/dL — ABNORMAL LOW (ref 8–23)
CO2: 35 mmol/L — ABNORMAL HIGH (ref 22–32)
Calcium: 8.7 mg/dL — ABNORMAL LOW (ref 8.9–10.3)
Chloride: 99 mmol/L (ref 98–111)
Creatinine, Ser: 0.76 mg/dL (ref 0.61–1.24)
GFR, Estimated: >60 mL/min (ref >60)
Glucose, Bld: 142 mg/dL — ABNORMAL HIGH (ref 70–99)
Potassium: 3.8 mmol/L (ref 3.5–5.1)
Sodium: 144 mmol/L (ref 135–145)

## 2024-02-09 LAB — CBC
HCT: 45.5 % (ref 39.0–52.0)
Hemoglobin: 12 g/dL — ABNORMAL LOW (ref 13.0–17.0)
MCH: 25.5 pg — ABNORMAL LOW (ref 26.0–34.0)
MCHC: 26.4 g/dL — ABNORMAL LOW (ref 30.0–36.0)
MCV: 96.6 fL (ref 80.0–100.0)
Platelets: 228 K/uL (ref 150–400)
RBC: 4.71 MIL/uL (ref 4.22–5.81)
RDW: 14.3 % (ref 11.5–15.5)
WBC: 13.6 K/uL — ABNORMAL HIGH (ref 4.0–10.5)
nRBC: 0 % (ref 0.0–0.2)

## 2024-02-09 LAB — BRAIN NATRIURETIC PEPTIDE: B Natriuretic Peptide: 462.2 pg/mL — ABNORMAL HIGH (ref 0.0–100.0)

## 2024-02-09 LAB — I-STAT VENOUS BLOOD GAS, ED
Acid-Base Excess: 13 mmol/L — ABNORMAL HIGH (ref 0.0–2.0)
Bicarbonate: 41.7 mmol/L — ABNORMAL HIGH (ref 20.0–28.0)
Calcium, Ion: 1.04 mmol/L — ABNORMAL LOW (ref 1.15–1.40)
HCT: 42 % (ref 39.0–52.0)
Hemoglobin: 14.3 g/dL (ref 13.0–17.0)
O2 Saturation: 92 %
Potassium: 3.4 mmol/L — ABNORMAL LOW (ref 3.5–5.1)
Sodium: 143 mmol/L (ref 135–145)
TCO2: 44 mmol/L — ABNORMAL HIGH (ref 22–32)
pCO2, Ven: 71.5 mmHg (ref 44–60)
pH, Ven: 7.374 (ref 7.25–7.43)
pO2, Ven: 69 mmHg — ABNORMAL HIGH (ref 32–45)

## 2024-02-09 LAB — TROPONIN I (HIGH SENSITIVITY)
Troponin I (High Sensitivity): 52 ng/L — ABNORMAL HIGH (ref <18)
Troponin I (High Sensitivity): 64 ng/L — ABNORMAL HIGH (ref <18)
Troponin I (High Sensitivity): 38 ng/L — ABNORMAL HIGH (ref <18)
Troponin I (High Sensitivity): 42 ng/L — ABNORMAL HIGH (ref <18)

## 2024-02-09 LAB — HIV ANTIBODY (ROUTINE TESTING W REFLEX): HIV Screen 4th Generation wRfx: NONREACTIVE

## 2024-02-09 MED ORDER — PANTOPRAZOLE SODIUM 40 MG PO TBEC
40.0000 mg | DELAYED_RELEASE_TABLET | Freq: Every day | ORAL | Status: DC
  Administered 2024-02-10 – 2024-02-13 (×4): 40 mg via ORAL
  Filled 2024-02-09 (×4): qty 1

## 2024-02-09 MED ORDER — IPRATROPIUM-ALBUTEROL 0.5-2.5 (3) MG/3ML IN SOLN
3.0000 mL | Freq: Once | RESPIRATORY_TRACT | Status: AC
  Administered 2024-02-09: 3 mL via RESPIRATORY_TRACT
  Filled 2024-02-09: qty 3

## 2024-02-09 MED ORDER — HYDRALAZINE HCL 25 MG PO TABS
25.0000 mg | ORAL_TABLET | Freq: Three times a day (TID) | ORAL | Status: DC
  Administered 2024-02-09 – 2024-02-13 (×13): 25 mg via ORAL
  Filled 2024-02-09 (×13): qty 1

## 2024-02-09 MED ORDER — FUROSEMIDE 40 MG PO TABS
40.0000 mg | ORAL_TABLET | Freq: Two times a day (BID) | ORAL | Status: DC
  Administered 2024-02-09 – 2024-02-13 (×8): 40 mg via ORAL
  Filled 2024-02-09 (×8): qty 1

## 2024-02-09 MED ORDER — ARFORMOTEROL TARTRATE 15 MCG/2ML IN NEBU
15.0000 ug | INHALATION_SOLUTION | Freq: Two times a day (BID) | RESPIRATORY_TRACT | Status: DC
  Administered 2024-02-09 – 2024-02-12 (×6): 15 ug via RESPIRATORY_TRACT
  Filled 2024-02-09 (×6): qty 2

## 2024-02-09 MED ORDER — BUDESONIDE 0.25 MG/2ML IN SUSP
0.2500 mg | Freq: Two times a day (BID) | RESPIRATORY_TRACT | Status: DC
  Administered 2024-02-09 – 2024-02-12 (×6): 0.25 mg via RESPIRATORY_TRACT
  Filled 2024-02-09 (×6): qty 2

## 2024-02-09 MED ORDER — NITROGLYCERIN 2 % TD OINT
1.0000 [in_us] | TOPICAL_OINTMENT | Freq: Once | TRANSDERMAL | Status: AC
  Administered 2024-02-09: 1 [in_us] via TOPICAL
  Filled 2024-02-09: qty 1

## 2024-02-09 MED ORDER — MAGNESIUM SULFATE 2 GM/50ML IV SOLN
2.0000 g | Freq: Once | INTRAVENOUS | Status: AC
  Administered 2024-02-09: 2 g via INTRAVENOUS
  Filled 2024-02-09: qty 50

## 2024-02-09 MED ORDER — ACETAMINOPHEN 325 MG PO TABS
650.0000 mg | ORAL_TABLET | Freq: Four times a day (QID) | ORAL | Status: DC | PRN
  Administered 2024-02-09 – 2024-02-11 (×3): 650 mg via ORAL
  Filled 2024-02-09 (×3): qty 2

## 2024-02-09 MED ORDER — FUROSEMIDE 10 MG/ML IJ SOLN
40.0000 mg | Freq: Once | INTRAMUSCULAR | Status: AC
  Administered 2024-02-09: 40 mg via INTRAVENOUS
  Filled 2024-02-09: qty 4

## 2024-02-09 MED ORDER — FERROUS SULFATE 325 (65 FE) MG PO TABS
325.0000 mg | ORAL_TABLET | Freq: Two times a day (BID) | ORAL | Status: DC
  Administered 2024-02-10 – 2024-02-13 (×8): 325 mg via ORAL
  Filled 2024-02-09 (×9): qty 1

## 2024-02-09 MED ORDER — GABAPENTIN 300 MG PO CAPS
300.0000 mg | ORAL_CAPSULE | Freq: Two times a day (BID) | ORAL | Status: DC
  Administered 2024-02-09 – 2024-02-13 (×9): 300 mg via ORAL
  Filled 2024-02-09 (×9): qty 1

## 2024-02-09 MED ORDER — IPRATROPIUM-ALBUTEROL 0.5-2.5 (3) MG/3ML IN SOLN
3.0000 mL | RESPIRATORY_TRACT | Status: DC
  Administered 2024-02-09 – 2024-02-10 (×4): 3 mL via RESPIRATORY_TRACT
  Filled 2024-02-09 (×4): qty 3

## 2024-02-09 MED ORDER — ALBUTEROL SULFATE (2.5 MG/3ML) 0.083% IN NEBU
5.0000 mg | INHALATION_SOLUTION | Freq: Once | RESPIRATORY_TRACT | Status: AC
  Administered 2024-02-09: 5 mg via RESPIRATORY_TRACT
  Filled 2024-02-09: qty 6

## 2024-02-09 MED ORDER — IPRATROPIUM-ALBUTEROL 0.5-2.5 (3) MG/3ML IN SOLN
3.0000 mL | Freq: Four times a day (QID) | RESPIRATORY_TRACT | Status: DC
  Administered 2024-02-09: 3 mL via RESPIRATORY_TRACT
  Filled 2024-02-09: qty 3

## 2024-02-09 MED ORDER — METHYLPREDNISOLONE SODIUM SUCC 125 MG IJ SOLR
125.0000 mg | Freq: Once | INTRAMUSCULAR | Status: AC
  Administered 2024-02-09: 125 mg via INTRAVENOUS
  Filled 2024-02-09: qty 2

## 2024-02-09 MED ORDER — ISOSORBIDE MONONITRATE ER 30 MG PO TB24
30.0000 mg | ORAL_TABLET | Freq: Every day | ORAL | Status: DC
  Administered 2024-02-09 – 2024-02-12 (×4): 30 mg via ORAL
  Filled 2024-02-09 (×5): qty 1

## 2024-02-09 MED ORDER — ENOXAPARIN SODIUM 40 MG/0.4ML IJ SOSY: 40.0000 mg | PREFILLED_SYRINGE | INTRAMUSCULAR | Status: DC

## 2024-02-09 MED ORDER — EZETIMIBE 10 MG PO TABS
10.0000 mg | ORAL_TABLET | Freq: Every day | ORAL | Status: DC
  Administered 2024-02-09 – 2024-02-13 (×5): 10 mg via ORAL
  Filled 2024-02-09 (×5): qty 1

## 2024-02-09 MED ORDER — REVEFENACIN 175 MCG/3ML IN SOLN
175.0000 ug | Freq: Every day | RESPIRATORY_TRACT | Status: DC
  Administered 2024-02-10 – 2024-02-12 (×3): 175 ug via RESPIRATORY_TRACT
  Filled 2024-02-09 (×3): qty 3

## 2024-02-09 MED ORDER — RIVAROXABAN 20 MG PO TABS
20.0000 mg | ORAL_TABLET | Freq: Every day | ORAL | Status: DC
  Administered 2024-02-09 – 2024-02-13 (×5): 20 mg via ORAL
  Filled 2024-02-09 (×6): qty 1

## 2024-02-09 MED ORDER — ASPIRIN 81 MG PO TBEC
81.0000 mg | DELAYED_RELEASE_TABLET | Freq: Every day | ORAL | Status: DC
  Administered 2024-02-09 – 2024-02-13 (×5): 81 mg via ORAL
  Filled 2024-02-09 (×5): qty 1

## 2024-02-09 MED ORDER — IPRATROPIUM BROMIDE 0.02 % IN SOLN
0.5000 mg | Freq: Once | RESPIRATORY_TRACT | Status: AC
  Administered 2024-02-09: 0.5 mg via RESPIRATORY_TRACT
  Filled 2024-02-09: qty 2.5

## 2024-02-09 MED ORDER — METHYLPREDNISOLONE SODIUM SUCC 125 MG IJ SOLR: 125.0000 mg | Freq: Once | INTRAMUSCULAR | Status: DC

## 2024-02-09 MED ORDER — BUDESON-GLYCOPYRROL-FORMOTEROL 160-9-4.8 MCG/ACT IN AERO
2.0000 | INHALATION_SPRAY | Freq: Every day | RESPIRATORY_TRACT | Status: DC
  Filled 2024-02-09: qty 5.9

## 2024-02-09 MED ORDER — PREDNISONE 20 MG PO TABS
40.0000 mg | ORAL_TABLET | Freq: Every day | ORAL | Status: AC
  Administered 2024-02-10 – 2024-02-13 (×4): 40 mg via ORAL
  Filled 2024-02-09 (×4): qty 2

## 2024-02-09 MED ORDER — ATORVASTATIN CALCIUM 80 MG PO TABS
80.0000 mg | ORAL_TABLET | Freq: Every day | ORAL | Status: DC
  Administered 2024-02-09 – 2024-02-13 (×5): 80 mg via ORAL
  Filled 2024-02-09 (×5): qty 1

## 2024-02-09 MED ORDER — NITROGLYCERIN 0.4 MG SL SUBL: 0.4000 mg | SUBLINGUAL_TABLET | SUBLINGUAL | Status: DC | PRN

## 2024-02-09 MED ORDER — METOPROLOL SUCCINATE ER 50 MG PO TB24
50.0000 mg | ORAL_TABLET | Freq: Every day | ORAL | Status: DC
  Administered 2024-02-09 – 2024-02-12 (×4): 50 mg via ORAL
  Filled 2024-02-09: qty 1
  Filled 2024-02-09: qty 2
  Filled 2024-02-09 (×3): qty 1

## 2024-02-09 NOTE — Progress Notes (Signed)
   02/09/24 1922  BiPAP/CPAP/SIPAP  BiPAP/CPAP/SIPAP Pt Type Adult  BiPAP/CPAP/SIPAP SERVO  Mask Type Full face mask  Mask Size Large  Set Rate 15 breaths/min  Respiratory Rate 16 breaths/min  IPAP 15 cmH20  EPAP 5 cmH2O  PEEP 5 cmH20  FiO2 (%) 35 %  Minute Ventilation 8  Leak 66  Peak Inspiratory Pressure (PIP) 14  Tidal Volume (Vt) 451  Patient Home Machine No  Patient Home Mask No  Patient Home Tubing No  Auto Titrate No  BiPAP/CPAP /SiPAP Vitals  Pulse Rate 69  Resp 16  SpO2 98 %  Bilateral Breath Sounds Diminished

## 2024-02-09 NOTE — ED Triage Notes (Signed)
 Patient brought in by EMS from home with SOB since earlier 02/08/24 and it got worst tonight. Per EMS patient tachycardia 120-160. Patient was given 1 Duoneb and 125mcg of Solumedrol. Patient alert and oriented x4. Patient on CPAP with EMS.

## 2024-02-09 NOTE — Progress Notes (Signed)
 RT called to bedside by RN requesting BIPAP per MD due to patient's elevated VBG results. Patient has COPD and venous blood gas results show as compensated. Upon assessing patient, he is not in distress and does not wish to wear BiPAP at this time. RT set up machine and discussed with patient the benefits of using BiPAP, patient agreed to giving the CPAP one more try but would rather wait until later this evening, closer to bedtime. RT will continue to monitor.

## 2024-02-09 NOTE — ED Notes (Signed)
 RT called for nebs due to patient on bipap

## 2024-02-09 NOTE — H&P (Cosign Needed Addendum)
 Date: 02/10/2024               Patient Name:  Jared [name] MRN: [id number]  DOB: Jan 06, 1956 Age / Sex: 68 y.o., male   PCP: Delbert Clam, MD         Medical Service: Internal Medicine Teaching Service         Attending Physician: Dr. Ronnald Sergeant      First Contact: Letha Cheadle, MD    Second Contact: Dr. Roetta Chars, MD          Pager Information: First Contact Pager: [phone]   Second Contact Pager: [phone]   SUBJECTIVE   Chief Complaint: Shortness of Breath  History of Present Illness  Jared [name] is a 68 y.o. male with PMHx of COPD with baseline 3L [place], HFrEF, PAF on Xarelto , CAD, T2DM, IDA, tobacco use disorder who presents for evaluation of shortness of breath onset yesterday morning. The patient states he is chronically short of breath due to his COPD. At baseline he uses 2 to 3 L of oxygen  via nasal cannula and has Breztri  and albuterol  inhalers. He increased his albuterol  inhaler use but this has not helped. He normally has a cough and notes speckled blood in his sputum for 1 time otherwise no changes. He states that he was just laying around the house when all of a sudden he felt short of breath yesterday morning. He checked his blood pressure which was high around 160/100. He also notes a headache with pain that is nonlocalized and he is unable describe this. Denies any recent URI symptoms increased from baseline. He states that he has an associated chest tightness more on right vs. Left but not currently experiencing any. He also states that whenever he is admitted to the hospital his upper extremity shakes but denies any fevers or chills at home. The patient still continues to smoke at home.  ED Course:  Labs significant for WBC of 12; BNP 462.2; VBG showing pCO2 71.5 and bicarb 41.7; Troponin of 52-> Imaging: CXR showing mild interstitial prominence c/f pulmonary vascular congestion vs. early CHF Received Nebulizer with BiPAP Consults: None  Meds  Albuterol  inhaler   Aspirin  81mg   Breztri  inhaler Cymbalta  - reports NOT taking Zetia  10mg  Ferous sulfate BID  Lasix  40mg  BID  Gabapentin  300mg  BID  Hydralazine  25mg  TID  Imdur  30mg   Metoprolol  50mg  - unable to confirm with patient Xarelto  20mg    Allergies  Allergies as of 02/09/2024 - Review Complete 02/09/2024  Allergen Reaction Noted   Lisinopril  Cough 06/29/2014   Past Medical History COPD HFrEF PAF on Xarelto  Hypertension IDA PVD GERD Peripheral neuropathy  Past Surgical History Past Surgical History:  Procedure Laterality Date   ABDOMINAL AORTOGRAM W/LOWER EXTREMITY N/A 12/07/2022   Procedure: ABDOMINAL AORTOGRAM W/LOWER EXTREMITY;  Surgeon: Magda Debby SAILOR, MD;  Location: [facility];  Service: Cardiovascular;  Laterality: N/A;   ABDOMINAL AORTOGRAM W/LOWER EXTREMITY Left 08/02/2023   Procedure: ABDOMINAL AORTOGRAM W/LOWER EXTREMITY;  Surgeon: Magda Debby SAILOR, MD;  Location: [facility];  Service: Cardiovascular;  Laterality: Left;   BALLOON ENTEROSCOPY N/A 11/19/2023   Procedure: ENTEROSCOPY, USING BALLOON;  Surgeon: San Sandor GAILS, DO;  Location: [facility];  Service: Gastroenterology;  Laterality: N/A;  single balloon enteroscopy   BIOPSY  11/12/2020   Procedure: BIOPSY;  Surgeon: Wilhelmenia Aloha Raddle., MD;  Location: [facility];  Service: Gastroenterology;;   CARDIAC CATHETERIZATION  10/2010   LM normal, LAD with 20% irregularities, LCX with 20%, RCA with  40% prox and 40% distal - EF of 35%   CATARACT EXTRACTION, BILATERAL     COLONOSCOPY W/ BIOPSIES AND POLYPECTOMY     COLONOSCOPY WITH PROPOFOL  N/A 09/06/2018   Procedure: COLONOSCOPY WITH PROPOFOL ;  Surgeon: Eda Iha, MD;  Location: [facility] ENDOSCOPY;  Service: Gastroenterology;  Laterality: N/A;   ENTEROSCOPY N/A 09/28/2018   Procedure: ENTEROSCOPY;  Surgeon: Rollin Dover, MD;  Location: [facility] ENDOSCOPY;  Service: Endoscopy;  Laterality: N/A;   ENTEROSCOPY N/A 10/28/2018   Procedure: ENTEROSCOPY;  Surgeon:  Eda Iha, MD;  Location: [facility] ENDOSCOPY;  Service: Gastroenterology;  Laterality: N/A;   ENTEROSCOPY N/A 10/09/2020   Procedure: ENTEROSCOPY;  Surgeon: Abran Norleen SAILOR, MD;  Location[facility] ENDOSCOPY;  Service: Endoscopy;  Laterality: N/A;   ENTEROSCOPY N/A 03/22/2022   Procedure: ENTEROSCOPY;  Surgeon: Leigh Elspeth SQUIBB, MD;  Location: [facility] ENDOSCOPY;  Service: Gastroenterology;  Laterality: N/A;   ENTEROSCOPY N/A 05/20/2023   Procedure: ENTEROSCOPY;  Surgeon: Legrand Victory LITTIE DOUGLAS, MD;  Location: [facility] ENDOSCOPY;  Service: Gastroenterology;  Laterality: N/A;   ENTEROSCOPY N/A 08/24/2023   Procedure: ENTEROSCOPY;  Surgeon: Suzann [name] HERO, MD;  Location: [facility] ENDOSCOPY;  Service: Gastroenterology;  Laterality: N/A;   ESOPHAGOGASTRODUODENOSCOPY N/A 11/12/2020   Procedure: ESOPHAGOGASTRODUODENOSCOPY (EGD);  Surgeon: Wilhelmenia Aloha Raddle., MD;  Location: [facility];  Service: Gastroenterology;  Laterality: N/A;   ESOPHAGOGASTRODUODENOSCOPY (EGD) WITH PROPOFOL  N/A 09/05/2018   Procedure: ESOPHAGOGASTRODUODENOSCOPY (EGD) WITH PROPOFOL ;  Surgeon: Leigh Elspeth SQUIBB, MD;  Location: [facility] ENDOSCOPY;  Service: Gastroenterology;  Laterality: N/A;   ESOPHAGOGASTRODUODENOSCOPY (EGD) WITH PROPOFOL  N/A 11/19/2020   Procedure: ESOPHAGOGASTRODUODENOSCOPY (EGD) WITH PROPOFOL ;  Surgeon: Albertus Gordy HERO, MD;  Location: [facility]OSCOPY;  Service: Gastroenterology;  Laterality: N/A;   ESOPHAGOGASTRODUODENOSCOPY (EGD) WITH PROPOFOL  N/A 12/27/2022   Procedure: ESOPHAGOGASTRODUODENOSCOPY (EGD) WITH PROPOFOL ;  Surgeon: Shila Gustav GAILS, MD;  Location: [facility];  Service: Gastroenterology;  Laterality: N/A;   EXCISION MASS HEAD     GIVENS CAPSULE STUDY N/A 09/06/2018   Procedure: GIVENS CAPSULE STUDY;  Surgeon: Eda Iha, MD;  Location: [facility] ENDOSCOPY;  Service: Gastroenterology;  Laterality: N/A;   GIVENS CAPSULE STUDY N/A 09/26/2018   Procedure: GIVENS CAPSULE STUDY;  Surgeon: Albertus Gordy HERO, MD;  Location: [facility] ENDOSCOPY;   Service: Gastroenterology;  Laterality: N/A;   GIVENS CAPSULE STUDY N/A 06/14/2019   Procedure: GIVENS CAPSULE STUDY;  Surgeon: Shila Gustav GAILS, MD;  Location: [facility];  Service: Endoscopy;  Laterality: N/A;   HEMOSTASIS CLIP PLACEMENT  11/12/2020   Procedure: HEMOSTASIS CLIP PLACEMENT;  Surgeon: Wilhelmenia Aloha Raddle., MD;  Location: [facility];  Service: Gastroenterology;;   HEMOSTASIS CONTROL  11/12/2020   Procedure: HEMOSTASIS CONTROL;  Surgeon: Wilhelmenia Aloha Raddle., MD;  Location: [facility];  Service: Gastroenterology;;   HOT HEMOSTASIS N/A 10/28/2018   Procedure: HOT HEMOSTASIS (ARGON PLASMA COAGULATION/BICAP);  Surgeon: Eda Iha, MD;  Location: [facility] ENDOSCOPY;  Service: Gastroenterology;  Laterality: N/A;   HOT HEMOSTASIS N/A 11/12/2020   Procedure: HOT HEMOSTASIS (ARGON PLASMA COAGULATION/BICAP);  Surgeon: Wilhelmenia Aloha Raddle., MD;  Location: [facility];  Service: Gastroenterology;  Laterality: N/A;   HOT HEMOSTASIS N/A 03/22/2022   Procedure: HOT HEMOSTASIS (ARGON PLASMA COAGULATION/BICAP);  Surgeon: Leigh Elspeth SQUIBB, MD;  Location: [facility] ENDOSCOPY;  Service: Gastroenterology;  Laterality: N/A;   HOT HEMOSTASIS N/A 05/20/2023   Procedure: HOT HEMOSTASIS (ARGON PLASMA COAGULATION/BICAP);  Surgeon: Legrand Victory LITTIE DOUGLAS, MD;  Location: [facility] ENDOSCOPY;  Service: Gastroenterology;  Laterality: N/A;   HOT HEMOSTASIS N/A 08/24/2023   Procedure: EGD, WITH ARGON PLASMA COAGULATION;  Surgeon: Suzann [name] HERO, MD;  Location: [facility] ENDOSCOPY;  Service: Gastroenterology;  Laterality: N/A;   INCISION AND DRAINAGE PERIRECTAL ABSCESS N/A 06/05/2017   Procedure: IRRIGATION AND DEBRIDEMENT PERIRECTAL ABSCESS;  Surgeon: Teresa Lonni HERO, MD;  Location: [facility];  Service: General;  Laterality: N/A;   IR CATHETER TUBE CHANGE  05/14/2023   LAPAROSCOPIC APPENDECTOMY N/A 05/01/2023   Procedure: APPENDECTOMY LAPAROSCOPIC;  Surgeon: Ebbie Cough, MD;  [facility] OR;  Service: General;   Laterality: N/A;   PERIPHERAL VASCULAR BALLOON ANGIOPLASTY Left 08/02/2023   Procedure: PERIPHERAL VASCULAR BALLOON ANGIOPLASTY;  Surgeon: Magda Debby SAILOR, MD;  [facility];  Service: Cardiovascular;  Laterality: Left;   PERIPHERAL VASCULAR INTERVENTION  12/07/2022   Procedure: PERIPHERAL VASCULAR INTERVENTION;  Surgeon: Magda Debby SAILOR, MD;  [facility]: [facility];  Service: Cardiovascular;;   PERIPHERAL VASCULAR INTERVENTION Left 08/02/2023   Procedure: PERIPHERAL VASCULAR INTERVENTION;  Surgeon: Magda Debby SAILOR, MD;  [facility];  Service: Cardiovascular;  Laterality: Left;   SUBMUCOSAL TATTOO INJECTION  11/12/2020   Procedure: SUBMUCOSAL TATTOO INJECTION;  Surgeon: Wilhelmenia Aloha Raddle., MD;  Location: [facility];  Service: Gastroenterology;;   SUBMUCOSAL TATTOO INJECTION  05/20/2023   Procedure: SUBMUCOSAL TATTOO INJECTION;  Surgeon: Legrand Victory LITTIE DOUGLAS, MD;  Location: [facility] ENDOSCOPY;  Service: Gastroenterology;;   VIDEO BRONCHOSCOPY Bilateral 05/08/2016   Procedure: VIDEO BRONCHOSCOPY WITH FLUORO;  Surgeon: Harden LULLA Staff, MD;  Location: [facility] ENDOSCOPY;  Service: Cardiopulmonary;  Laterality: Bilateral;     Social History  Living Situation: Lives in a house with 3 other roommates 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 Level of Function: Independent, able to complete all ADLs and IADLs PCP: Delbert Clam, MD  Substances: -Tobacco: started smoking at 17, 1ppd,  -Alcohol: socially; about 2x/year he will drink a shot of liquor -Recreational Drug: Denies marijuana, cocaine, IVDU  Family History  Family History  Problem Relation Age of Onset   Heart disease Mother    Diabetes Mother    Colon cancer Mother    Liver cancer Mother    Cancer Father        type unknown   Diabetes Sister        x 2   Diabetes Brother   Mother passed away from metastatic colorectal cancer  Review of Systems  A  complete ROS was negative except as per HPI.   OBJECTIVE:   Physical Exam: Blood pressure (!) 148/89, pulse (!) 102, temperature 97.7 F (36.5 C), temperature source Axillary, resp. rate (!) 21, height 6' 1 (1.854 m), weight 95.8 kg, SpO2 97%.  Constitutional: ill-appearing, well-nourished but in no acute distress HENT: normocephalic atraumatic, mucous membranes moist; wearing nasal cannula Eyes: conjunctiva non-erythematous, PERRL, no scleral icterus Cardiovascular: slightly tachycardic with normal rhythm, no m/r/g Pulmonary/Chest: mildly increased work of breathing on 3L [place], diffuse expiratory wheezing upon auscultation Abdominal: soft, non-tender, non-distended, bowel sounds normal MSK: normal bulk and tone Neurological: alert & oriented x3, able to pull himself up in bed using bed rails Skin: warm and dry Extremities: no edema or cyanosis; peripheral pulses intact Psych: normal mood and affect, thought content normal  Labs: CBC    Component Value Date/Time   WBC 5.8 02/10/2024 0247   RBC 4.47 02/10/2024 0247   HGB 11.3 (L) 02/10/2024 0247   HGB 11.5 (L) 02/06/2024 1417   HGB 10.2 (L) 06/10/2023 1012   HCT 42.0 02/10/2024 0247   HCT 33.9 (L)  06/10/2023 1012   PLT 203 02/10/2024 0247   PLT 162 02/06/2024 1417   PLT 332 06/10/2023 1012   MCV 94.0 02/10/2024 0247   MCV 87 06/10/2023 1012   MCH 25.3 (L) 02/10/2024 0247   MCHC 26.9 (L) 02/10/2024 0247   RDW 14.2 02/10/2024 0247   RDW 14.3 06/10/2023 1012   LYMPHSABS 1.2 02/06/2024 1417   LYMPHSABS 2.7 06/10/2023 1012   MONOABS 0.5 02/06/2024 1417   EOSABS 0.2 02/06/2024 1417   EOSABS 0.1 06/10/2023 1012   BASOSABS 0.0 02/06/2024 1417   BASOSABS 0.1 06/10/2023 1012     CMP     Component Value Date/Time   NA 143 02/10/2024 0247   NA 142 06/10/2023 1012   K 3.7 02/10/2024 0247   CL 94 (L) 02/10/2024 0247   CO2 41 (H) 02/10/2024 0247   GLUCOSE 175 (H) 02/10/2024 0247   BUN 15 02/10/2024 0247   BUN 13 06/10/2023  1012   CREATININE 0.83 02/10/2024 0247   CREATININE 1.20 07/30/2016 1431   CALCIUM  9.2 02/10/2024 0247   PROT 6.5 08/22/2023 1148   PROT 6.7 10/25/2022 1027   ALBUMIN  3.2 (L) 08/22/2023 1148   ALBUMIN  4.4 10/25/2022 1027   AST 19 08/22/2023 1148   ALT 22 08/22/2023 1148   ALKPHOS 64 08/22/2023 1148   BILITOT 0.3 08/22/2023 1148   BILITOT 0.3 10/25/2022 1027   GFRNONAA >60 02/10/2024 0247   GFRNONAA 64 07/30/2016 1431   GFRAA 91 01/29/2020 1020   GFRAA 74 07/30/2016 1431    Imaging: No results found.   EKG: personally reviewed my interpretation is SVT. Prior EKG earlier in the ED showed sinus tachycardia.  ASSESSMENT & PLAN:   Assessment & Plan by Problem: Active Problems:   COPD with acute exacerbation (HCC)   Jared [name] is a male living with a history of COPD with baseline 3L [place], HFrEF, PAF on Xarelto , T2DM, CAD, IDA, tobacco use disorder who presented with acute shortness of breath and was admitted for COPD exacerbation on hospital day 1.  #Acute hypercarbic respiratory failure, chronic hypoxic respiratory failure #COPD Exacerbation #Compensated Respiratory Acidosis Patient presented to ED with acute shortness of breath and wheezing. He is a patient of Dr. Darlean at [facility]. Currently on Breztri  inhaler with Albuterol  inhaler/nebulizer for his COPD. Baseline oxygen  as 2-3 L nasal cannula. He was afebrile but tachypneic and tachycardic. Exam showing mild tachycardia with diffuse wheezing upon expiration in all lung fields. Initial VBG showing pCO2 of 71.5 with a bicarb of 41.7. Chest x-ray showing interstitial prominence concerning for pulmonary vascular congestion versus early CHF. In the ED the patient was started on BiPAP and given nebulizer with improvement of his symptoms. Patient is euvolemic on exam without concerns for encephalopathy, more concerning for COPD exacerbation likely caused from continued smoking. Less likely in the setting of respiratory  infection but will check RVP. -Respiratory virus panel pending -Recheck VBG pending -IV methylprednisolone  125 mg once -Prednisone  40 mg starting 02/10/2024 for 4 days -Start Breztri  (budesonide -glycopyrrolate -formoterol ) inhaler -Start DuoNeb every 6 hours -Continue home [place] O2 of 3L  #CAD #Chest tightness Patient has history of CAD on cath in 2012, now presenting with chest tightness that comes and goes. Localizes to the right side of his chest. Initial troponin elevated to 52->64. EKG without signs of ST elevations. Chest tightness likely related to COPD exacerbation. Given low delta increase in troponins, suspect this may be in the setting of demand ischemia.  -Nitroglycerin  sublingual 0.4mg  prn -Continue  atorvastatin  80 mg daily -Continue aspirin  81 mg daily  #HFimpEF Patient presented with acute on chronic SOB. Appears euvolemic; no crackles or leg swelling on exam. BNP is elevated to 462 which is only sign of heart failure at this time. Last echocardiogram on 05/02/2023 showing LVEF of 55-60%, with grade II diastolic dysfunction. RV systolic function moderately reduced with moderately enlarged ventricular size. LA mildly dilated. Due to current symptoms we will order updated echocardiogram and continue home medications. -TTE ordered, pending -Continue home metoprolol  succinate 50 mg daily -Continue home Lasix  40 mg twice daily  #Hypertension Blood pressures have been elevated upon admission up to 160s/110s. Patient has also been reporting a generalized headache. Will restart on home medications given hypertension. -Acetaminophen  650 mg every 6 hours as needed for pain -Continue home hydralazine  25 mg 3 times daily -Continue home metoprolol  succinate 50 mg daily -Continue Imdur  30mg  daily  #Iron  Deficiency Anemia Appears stable, no signs or symptoms of bleeding. Hemoglobin on admission of 12.0. Has history of anemia due to chronic blood loss. GI performed EGD which showed  esophagitis. Enteroscopy showed small bowel angioectasias which were treated with APC. He follows with hematology and is on frequent IV iron  supplementation, last on 01/23/24. -trend CBCs  #Prolonged Qtc on EKG Patient was previously in SVT on EKG, but back in NSR. Both EKGs showing prolonged QT. -Avoid QT-prolonging medications  #PVD History of peripheral vascular disease in the left lower extremity. Vascular surgery performed catheterization on 08/02/2023 which showed atherosclerosis and recommended aspirin , Plavix , Eliquis, and statin with 4-week follow-up for ABI and duplex. The patient had symptomatic anemia with Plavix , so this was discontinued. Appropriate to continue home medications. Ensure proper outpatient follow-up with vascular surgery. -Continue atorvastatin  80 mg -Continue aspirin  81 mg -Continue Xarelto  20 mg  #T2DM Last A1c of 5.4% on 06/10/23. Appears well-controlled. Will consider sliding scale insulin  if glucoses continue to rise. -Continue to monitor  #TUD Patient reports smoking despite oxygen  use with history of COPD. Consider nicotine  patch if patient would like it.  #GERD #Esophagitis -continue pantoprazole  40mg  daily  #HLD -Continue Ezetimibe  10mg  daily -Continue atorvastatin  80mg  daily  #Peripheral Neropathy -continue home gabapentin  300mg  bid   Best practice: Diet: Normal VTE: DOAC IVF: None Code: Full  Disposition planning: Prior to Admission Living Arrangement: Home Anticipated Discharge Location: Home  Dispo: Admit patient to Inpatient with expected length of stay greater than 2 midnights.  Signed: Renley Gutman, MD [facility]  PGY-1 02/10/2024, 8:11 AM  Please contact IM Residency On-Call Pager at: [phone] or [phone].

## 2024-02-09 NOTE — Progress Notes (Signed)
 Patient requesting to eat dinner. Bipap removed. 3L [place] placed while patient is eating.

## 2024-02-09 NOTE — ED Provider Notes (Signed)
 Signed out to admit to medicine when labs resulted.   Pt has been given lasix , albuterol /atrovent  txs.   Pt reports breathing has improved. No chest pain. Moving air fairly well. Mentating normally. Trial off bipap.   Medicine consulted for admission.      Daysy Santini, MD 02/09/24 [phone]

## 2024-02-09 NOTE — ED Notes (Signed)
 Patient having intermittent episodes of SVT, likely attributed to multiple doses of albuterol  given. MD Steinl aware.

## 2024-02-09 NOTE — Hospital Course (Addendum)
 Always been shrot of breath beause he has COPD, but yesterday was worse. Was laying around the ouse when it got worse. At home uses 2-3 liters, blood pressure was high at home as well. 160/100ish. Headache has been there since he got here. Headache is hurting all over.   Always has COPD, but hasn't felt sick at all. Not getting good air now. Has inhalers at home and has been using them. Normally has a cough at home, but hasn't been getting worse. Normally coughs things up, but that hasn't changed. Just a little speck of blood one time.   No leg swelling. Had some chest apin yesterday too, on the right side, sometimes on the left. Not having any currently. Feels tight like he needs a massage. Lasted for a few hours. Everytime he comes he is shaking.   Albuterol  inhaler  Aspirin  81mg   Breztri   Not taking Cymbalta   Zetia  10mg   Ferous sulfate BID  Lasix  40mg  BID  Gabapentin  300mg  BID  Hydralazine  25mg  TID  Imdur  30mg   Metoprolol  50mg  (not sur eif he's taking  Xarelto  20mg    Allergic to lisinopril  - coughs a lot   Fam History:  Mom and dad both died of cancer   Social:  Lives in a room in a house with three guys  Not working  Dr. Delbert, MD [facility]  Independent in all ADLs/IADLs, has an aid that comes around the house Smokes cigarrettes - started around 17, one pack a day. Currently doing a little less Occasionally drinks alcohol, 2-3 times out the year. Just a shot. No marijuana cocaine, IV drugs.   Jared [name] is a male living with a history of COPD with baseline 3L [place], HFrEF, PAF on Xarelto , T2DM, CAD, IDA, tobacco use disorder who presented with acute shortness of breath and was admitted for COPD exacerbation on hospital day 0.   #Acute hypoxic respiratory failure #COPD Exacerbation #Compensated Respiratory Acidosis Patient presented to ED with acute shortness of breath and wheezing. He is a patient of Dr. Darlean at [facility]. Currently on  Breztri  inhaler with Albuterol  inhaler/nebulizer for his COPD. Baseline oxygen  as 2-3 L nasal cannula. He was afebrile but tachypneic and tachycardic. Exam showing mild tachycardia with diffuse wheezing upon expiration in all lung fields. Initial VBG showing pCO2 of 71.5 with a bicarb of 41.7. Chest x-ray showing interstitial prominence concerning for pulmonary vascular congestion versus early CHF. In the ED the patient was started on BiPAP and given nebulizer with improvement of his symptoms. Patient is euvolemic on exam without concerns for encephalopathy, more concerning for COPD exacerbation likely caused from continued smoking. Less likely in the setting of respiratory infection but will check RVP. -Respiratory virus panel pending -Recheck VBG pending -IV methylprednisolone  125 mg once -Prednisone  40 mg starting 02/10/2024 for 4 days -Start Breztri  (budesonide -glycopyrrolate -formoterol ) inhaler -Start DuoNeb every 6 hours -Continue home [place] O2 of 3L   #CAD Patient has history of CAD on cath in 2012, now presenting with chest tightness that comes and goes. Localizes to the right side of his chest. Initial troponin elevated to 52->64. EKG without signs of ST elevations. Chest tightness likely related to COPD exacerbation. Given low delta increase in troponins, suspect this may be in the setting of demand ischemia. Less likely due to ACS. -Nitroglycerin  sublingual 0.4mg  prn -Continue atorvastatin  80 mg daily -Continue aspirin  81 mg daily   #HFrEF Patient presented with acute on chronic SOB. He was euvolemic but BNP was initially elevated  to 462. Last echocardiogram on 05/02/2023 so we obtained a new one in light of his symptoms that showed ***. LA mildly dilated. Due to his symptoms we obtained an updated echocardiogram and continued his home medications. -TTE ordered, pending -Continue home metoprolol  succinate 50 mg daily -Continue home Lasix  40 mg twice daily   #Hypertension Patient initially  presented with elevated blood pressures upon admission up to 160s/110s. He also had a generalized headache which *** resolved with Tylenol . We restarted him on his home medications including hydralazine , metoprolol  succinate, and Imdur .    #Iron  Deficiency Anemia Hemoglobin on admission was 12.0 and trended *** There were no signs or symptoms of bleeding during this admission. Has history of anemia due to chronic blood loss. GI previously performed EGD which showed esophagitis. Prior enteroscopy showed small bowel angioectasias which were treated with APC. He follows with hematology and is on frequent IV iron  supplementation, last on 01/23/24.   #Prolonged QTc on EKG Patient was previously in SVT on EKG, but back in NSR. Both EKGs he obtained in the ED showed a prolonged QT.    #PVD History of peripheral vascular disease in the left lower extremity. Vascular surgery performed catheterization on 08/02/2023 which showed atherosclerosis and recommended Aspirin , Plavix , Eliquis, and statin with 4-week follow-up for ABI and duplex. The patient had symptomatic anemia with Plavix  at that time, so this was discontinued. We continued his home medications. Ensure proper outpatient follow-up with vascular surgery. -Continue Atorvastatin  80 mg -Continue Aspirin  81 mg -Continue Xarelto  20 mg   #T2DM The patient has a reported history of T2DM with last A1c of 5.4% on 06/10/23. It appears to be well-controlled without anti-hypgerglycemic medications. *** Glucoses were elevated but not to the degree that SSI was warranted. ***   #Tobacco use Disorder Patient reports smoking despite oxygen  use despite history of COPD which is a likely cause of his COPD exacerbation. We considered using nicotine  patches but the patient ***did not want this.    #GERD #Esophagitis No acute concerns during this hospitalization. We continued his home pantoprazole .  #HLD Continued his home Zetia  and Lipitor .    #Peripheral  Neropathy No acute complaints during this hospitalization. We continued his home gabapentin  300mg  bid.    Chest tightness went away, breathing feels about the same, Walking to the bathroom this morning he could feel his breathing and O2 dropping. Breathing fine when resting; Shaking right arm and R leg--one time LOC but most of the time has happened in the hospital. Last shaking few mins ago--might have lasted 10 mins 9/2: shaking persent

## 2024-02-09 NOTE — Progress Notes (Signed)
Bipap placed back on pt.

## 2024-02-09 NOTE — ED Notes (Signed)
 Patient taken off bipap by myself at request of provider, placed onto 3 lpm by [place].

## 2024-02-09 NOTE — ED Provider Notes (Signed)
 [facility] Emergency Department Provider Note MRN:  [id number]  Arrival date & time: 02/09/24     Chief Complaint   Shortness of Breath   History of Present Illness   Jared [name] is a 68 y.o. year-old male with a history of CAD, CHF, A-fib presenting to the ED with chief complaint of shortness of breath.  Worsening shortness of breath over the past few days, worse this morning.  Denies chest pain.  Required CPAP with EMS.  Review of Systems  A thorough review of systems was obtained and all systems are negative except as noted in the HPI and PMH.   Patient's Health History    Past Medical History:  Diagnosis Date   Acute blood loss anemia 05/18/2023   Acute perforated appendicitis 04/30/2023   AKI (acute kidney injury) (HCC) 11/18/2020   Atrial fibrillation with RVR (HCC) 05/06/2023   AVM (arteriovenous malformation)    CAD (coronary artery disease)    a. LHC 5/12:  LAD 20, pLCx 20, pRCA 40, dRCA 40, EF 35%, diff HK  //  b. Myoview 4/16: Overall Impression:  High risk stress nuclear study There is no evidence of ischemia.  There is severe LV dysfunction. LV Ejection Fraction: 30%.  LV Wall Motion:  There is global LV hypokinesis.     CAP (community acquired pneumonia) 09/2013   Chronic combined systolic and diastolic CHF (congestive heart failure) (HCC)    a. Echo 4/16:Mild LVH, EF 40-45%, diffuse HK //  b. Echo 8/17: EF 35-40%, diffuse HK, diastolic dysfunction, aortic sclerosis, trivial MR, moderate LAE, normal RVSF, moderate RAE, mild TR, PASP 42 mmHg // c. Echo 4/18: Mild concentric LVH, EF 30-35, normal wall motion, grade 1 diastolic dysfunction, PASP 49   Chronic respiratory failure (HCC)    Cluster headache    hx; haven't had one in awhile (01/09/2016)   COPD (chronic obstructive pulmonary disease) (HCC)    thelbert 01/09/2016   DM (diabetes mellitus) (HCC)    History of CVA (cerebrovascular accident)    Hypertension    IDA (iron  deficiency  anemia)    Moderate tobacco use disorder    NICM (nonischemic cardiomyopathy) (HCC)    Nicotine  addiction    Peripheral vascular disease (HCC)    Syncope 06/11/2019   Tobacco abuse    Type 2 diabetes mellitus (HCC) 05/14/2016    Past Surgical History:  Procedure Laterality Date   ABDOMINAL AORTOGRAM W/LOWER EXTREMITY N/A 12/07/2022   Procedure: ABDOMINAL AORTOGRAM W/LOWER EXTREMITY;  Surgeon: Magda Debby SAILOR, MD;  [facility];  Service: Cardiovascular;  Laterality: N/A;   ABDOMINAL AORTOGRAM W/LOWER EXTREMITY Left 08/02/2023   Procedure: ABDOMINAL AORTOGRAM W/LOWER EXTREMITY;  Surgeon: Magda Debby SAILOR, MD;  [facility];  Service: Cardiovascular;  Laterality: Left;   BALLOON ENTEROSCOPY N/A 11/19/2023   Procedure: ENTEROSCOPY, USING BALLOON;  Surgeon: San Sandor GAILS, DO;  Location: [facility] ENDOSCOPY;  Service: Gastroenterology;  Laterality: N/A;  single balloon enteroscopy   BIOPSY  11/12/2020   Procedure: BIOPSY;  Surgeon: Wilhelmenia Aloha Raddle., MD;  Location: [facility] ENDOSCOPY;  Service: Gastroenterology;;   CARDIAC CATHETERIZATION  10/2010   LM normal, LAD with 20% irregularities, LCX with 20%, RCA with 40% prox and 40% distal - EF of 35%   CATARACT EXTRACTION, BILATERAL     COLONOSCOPY W/ BIOPSIES AND POLYPECTOMY     COLONOSCOPY WITH PROPOFOL  N/A 09/06/2018   Procedure: COLONOSCOPY WITH PROPOFOL ;  Surgeon: Eda Iha, MD;  [facility];  Service: Gastroenterology;  Laterality: N/A;   ENTEROSCOPY N/A 09/28/2018   Procedure: ENTEROSCOPY;  Surgeon: Rollin Dover, MD;  Location: [facility] ENDOSCOPY;  Service: Endoscopy;  Laterality: N/A;   ENTEROSCOPY N/A 10/28/2018   Procedure: ENTEROSCOPY;  Surgeon: Eda Iha, MD;  Location: [facility] ENDOSCOPY;  Service: Gastroenterology;  Laterality: N/A;   ENTEROSCOPY N/A 10/09/2020   Procedure: ENTEROSCOPY;  Surgeon: Abran Norleen SAILOR, MD;  Location: [facility] ENDOSCOPY;  Service: Endoscopy;  Laterality: N/A;   ENTEROSCOPY N/A  03/22/2022   Procedure: ENTEROSCOPY;  Surgeon: Leigh Elspeth SQUIBB, MD;  Location: [facility] ENDOSCOPY;  Service: Gastroenterology;  Laterality: N/A;   ENTEROSCOPY N/A 05/20/2023   Procedure: ENTEROSCOPY;  Surgeon: Legrand Victory LITTIE DOUGLAS, MD;  Location: [facility] ENDOSCOPY;  Service: Gastroenterology;  Laterality: N/A;   ENTEROSCOPY N/A 08/24/2023   Procedure: ENTEROSCOPY;  Surgeon: Suzann [name] HERO, MD;  Location: [facility] ENDOSCOPY;  Service: Gastroenterology;  Laterality: N/A;   ESOPHAGOGASTRODUODENOSCOPY N/A 11/12/2020   Procedure: ESOPHAGOGASTRODUODENOSCOPY (EGD);  Surgeon: Wilhelmenia Aloha Raddle., MD;  Location: [facility];  Service: Gastroenterology;  Laterality: N/A;   ESOPHAGOGASTRODUODENOSCOPY (EGD) WITH PROPOFOL  N/A 09/05/2018   Procedure: ESOPHAGOGASTRODUODENOSCOPY (EGD) WITH PROPOFOL ;  Surgeon: Leigh Elspeth SQUIBB, MD;  Location: [facility] ENDOSCOPY;  Service: Gastroenterology;  Laterality: N/A;   ESOPHAGOGASTRODUODENOSCOPY (EGD) WITH PROPOFOL  N/A 11/19/2020   Procedure: ESOPHAGOGASTRODUODENOSCOPY (EGD) WITH PROPOFOL ;  Surgeon: Albertus Gordy HERO, MD;  Location[facility];  Service: Gastroenterology;  Laterality: N/A;   ESOPHAGOGASTRODUODENOSCOPY (EGD) WITH PROPOFOL  N/A 12/27/2022   Procedure: ESOPHAGOGASTRODUODENOSCOPY (EGD) WITH PROPOFOL ;  Surgeon: Shila Gustav GAILS, MD;  [facility];  Service: Gastroenterology;  Laterality: N/A;   EXCISION MASS HEAD     GIVENS CAPSULE STUDY N/A 09/06/2018   Procedure: GIVENS CAPSULE STUDY;  Surgeon: Eda Iha, MD;  Location: [facility] ENDOSCOPY;  Service: Gastroenterology;  Laterality: N/A;   GIVENS CAPSULE STUDY N/A 09/26/2018   Procedure: GIVENS CAPSULE STUDY;  Surgeon: Albertus Gordy HERO, MD;  Location: [facility] ENDOSCOPY;  Service: Gastroenterology;  Laterality: N/A;   GIVENS CAPSULE STUDY N/A 06/14/2019   Procedure: GIVENS CAPSULE STUDY;  Surgeon: Shila Gustav GAILS, MD;  [facility];  Service: Endoscopy;  Laterality: N/A;   HEMOSTASIS CLIP PLACEMENT  11/12/2020    Procedure: HEMOSTASIS CLIP PLACEMENT;  Surgeon: Wilhelmenia Aloha Raddle., MD;  Location: [facility];  Service: Gastroenterology;;   HEMOSTASIS CONTROL  11/12/2020   Procedure: HEMOSTASIS CONTROL;  Surgeon: Wilhelmenia Aloha Raddle., MD;  Location: [facility];  Service: Gastroenterology;;   HOT HEMOSTASIS N/A 10/28/2018   Procedure: HOT HEMOSTASIS (ARGON PLASMA COAGULATION/BICAP);  Surgeon: Eda Iha, MD;  Location: [facility] ENDOSCOPY;  Service: Gastroenterology;  Laterality: N/A;   HOT HEMOSTASIS N/A 11/12/2020   Procedure: HOT HEMOSTASIS (ARGON PLASMA COAGULATION/BICAP);  Surgeon: Wilhelmenia Aloha Raddle., MD;  Location: [facility];  Service: Gastroenterology;  Laterality: N/A;   HOT HEMOSTASIS N/A 03/22/2022   Procedure: HOT HEMOSTASIS (ARGON PLASMA COAGULATION/BICAP);  Surgeon: Leigh Elspeth SQUIBB, MD;  Location: [facility] ENDOSCOPY;  Service: Gastroenterology;  Laterality: N/A;   HOT HEMOSTASIS N/A 05/20/2023   Procedure: HOT HEMOSTASIS (ARGON PLASMA COAGULATION/BICAP);  Surgeon: Legrand Victory LITTIE DOUGLAS, MD;  Location: [facility] ENDOSCOPY;  Service: Gastroenterology;  Laterality: N/A;   HOT HEMOSTASIS N/A 08/24/2023   Procedure: EGD, WITH ARGON PLASMA COAGULATION;  Surgeon: Suzann [name] HERO, MD;  Location: [facility] ENDOSCOPY;  Service: Gastroenterology;  Laterality: N/A;   INCISION AND DRAINAGE PERIRECTAL ABSCESS N/A 06/05/2017   Procedure: IRRIGATION AND DEBRIDEMENT PERIRECTAL ABSCESS;  Surgeon: Teresa Lonni HERO, MD;  [facility];  Service: General;  Laterality:  N/A;   IR CATHETER TUBE CHANGE  05/14/2023   LAPAROSCOPIC APPENDECTOMY N/A 05/01/2023   Procedure: APPENDECTOMY LAPAROSCOPIC;  Surgeon: Ebbie Cough, MD;  Location: [facility] OR;  Service: General;  Laterality: N/A;   PERIPHERAL VASCULAR BALLOON ANGIOPLASTY Left 08/02/2023   Procedure: PERIPHERAL VASCULAR BALLOON ANGIOPLASTY;  Surgeon: Magda Debby SAILOR, MD;  [facility];  Service: Cardiovascular;  Laterality: Left;   PERIPHERAL VASCULAR  INTERVENTION  12/07/2022   Procedure: PERIPHERAL VASCULAR INTERVENTION;  Surgeon: Magda Debby SAILOR, MD;  [facility];  Service: Cardiovascular;;   PERIPHERAL VASCULAR INTERVENTION Left 08/02/2023   Procedure: PERIPHERAL VASCULAR INTERVENTION;  Surgeon: Magda Debby SAILOR, MD;  [facility];  Service: Cardiovascular;  Laterality: Left;   SUBMUCOSAL TATTOO INJECTION  11/12/2020   Procedure: SUBMUCOSAL TATTOO INJECTION;  Surgeon: Wilhelmenia Aloha Raddle., MD;  Location: [facility];  Service: Gastroenterology;;   SUBMUCOSAL TATTOO INJECTION  05/20/2023   Procedure: SUBMUCOSAL TATTOO INJECTION;  Surgeon: Legrand Victory LITTIE DOUGLAS, MD;  Location: [facility] ENDOSCOPY;  Service: Gastroenterology;;   VIDEO BRONCHOSCOPY Bilateral 05/08/2016   Procedure: VIDEO BRONCHOSCOPY WITH FLUORO;  Surgeon: Harden LULLA Staff, MD;  Location: [facility] ENDOSCOPY;  Service: Cardiopulmonary;  Laterality: Bilateral;    Family History  Problem Relation Age of Onset   Heart disease Mother    Diabetes Mother    Colon cancer Mother    Liver cancer Mother    Cancer Father        type unknown   Diabetes Sister        x 2   Diabetes Brother     Social History   Socioeconomic History   Marital status: Single    Spouse name: Not on file   Number of children: 1   Years of education: Not on file   Highest education level: 12th grade  Occupational History   Occupation: retired  Tobacco Use   Smoking status: Every Day    Current packs/day: 0.50    Average packs/day: 0.5 packs/day for 47.0 years (23.5 ttl pk-yrs)    Types: Cigarettes   Smokeless tobacco: Never   Tobacco comments:    4/5 cigs  Vaping Use   Vaping status: Never Used  Substance and Sexual Activity   Alcohol use: Not Currently    Alcohol/week: 0.0 standard drinks of alcohol    Comment: last drink was before xmas   Drug use: No    Types: Cocaine, Marijuana    Comment: nothing in 20 years   Sexual activity: Not Currently  Other Topics Concern    Not on file  Social History Narrative   unemployed   Social Drivers of Corporate investment banker Strain: Low Risk  (06/10/2023)   Overall Financial Resource Strain (CARDIA)    Difficulty of Paying Living Expenses: Not hard at all  Food Insecurity: No Food Insecurity (08/27/2023)   Hunger Vital Sign    Worried About Running Out of Food in the Last Year: Never true    Ran Out of Food in the Last Year: Never true  Recent Concern: Food Insecurity - Food Insecurity Present (06/10/2023)   Hunger Vital Sign    Worried About Running Out of Food in the Last Year: Sometimes true    Ran Out of Food in the Last Year: Never true  Transportation Needs: No Transportation Needs (08/27/2023)   PRAPARE - Administrator, Civil Service (Medical): No    Lack of Transportation (Non-Medical): No  Physical Activity:  Insufficiently Active (06/10/2023)   Exercise Vital Sign    Days of Exercise per Week: 3 days    Minutes of Exercise per Session: 20 min  Stress: No Stress Concern Present (06/10/2023)   [employer] of Occupational Health - Occupational Stress Questionnaire    Feeling of Stress : Not at all  Social Connections: Moderately Isolated (08/22/2023)   Social Connection and Isolation Panel    Frequency of Communication with Friends and Family: More than three times a week    Frequency of Social Gatherings with Friends and Family: Twice a week    Attends Religious Services: Never    Database administrator or Organizations: Yes    Attends Banker Meetings: 1 to 4 times per year    Marital Status: Divorced  Intimate Partner Violence: Not At Risk (08/27/2023)   Humiliation, Afraid, Rape, and Kick questionnaire    Fear of Current or Ex-Partner: No    Emotionally Abused: No    Physically Abused: No    Sexually Abused: No     Physical Exam   Vitals:   02/09/24 0607 02/09/24 0608  BP: (!) 164/113   Pulse: (!) 145   Resp: 16   Temp: 97.7 F (36.5 C)   SpO2: 97%  97%    CONSTITUTIONAL: Well-appearing, NAD NEURO/PSYCH:  Alert and oriented x 3, no focal deficits EYES:  eyes equal and reactive ENT/NECK:  no LAD, no JVD CARDIO: Tachycardic rate, well-perfused, normal S1 and S2 PULM: Poor air movement, increased work of breathing, on BiPAP GI/GU:  non-distended, non-tender MSK/SPINE:  No gross deformities, no edema SKIN:  no rash, atraumatic   *Additional and/or pertinent findings included in MDM below  Diagnostic and Interventional Summary    EKG Interpretation Date/Time:    Ventricular Rate:    PR Interval:    QRS Duration:    QT Interval:    QTC Calculation:   R Axis:      Text Interpretation:         Labs Reviewed  BASIC METABOLIC PANEL WITH GFR  CBC  BRAIN NATRIURETIC PEPTIDE  TROPONIN I (HIGH SENSITIVITY)    DG Chest Port 1 View    (Results Pending)    Medications  ipratropium-albuterol  (DUONEB) 0.5-2.5 (3) MG/3ML nebulizer solution 3 mL (has no administration in time range)  magnesium  sulfate IVPB 2 g 50 mL (has no administration in time range)     Procedures  /  Critical Care .Critical Care  Performed by: Theadore Ozell HERO, MD Authorized by: Theadore Ozell HERO, MD   Critical care provider statement:    Critical care time (minutes):  45   Critical care was necessary to treat or prevent imminent or life-threatening deterioration of the following conditions:  Respiratory failure   Critical care was time spent personally by me on the following activities:  Development of treatment plan with patient or surrogate, discussions with consultants, evaluation of patient's response to treatment, examination of patient, ordering and review of laboratory studies, ordering and review of radiographic studies, ordering and performing treatments and interventions, pulse oximetry, re-evaluation of patient's condition and review of old charts   ED Course and Medical Decision Making  Initial Impression and Ddx Patient presenting with hypoxic  respiratory failure, suspect COPD exacerbation versus CHF exacerbation.  Patient also exhibiting intermittent tachyarrhythmia, question atrial flutter versus A-fib.  Has a known history of A-fib.  On Xarelto .  Past medical/surgical history that increases complexity of ED encounter: COPD, CHF  Interpretation of  Diagnostics I personally reviewed the EKG and my interpretation is as follows: Sinus tachycardia, intermittent tachyarrhythmia  Labs pending  Patient Reassessment and Ultimate Disposition/Management     Signed out to oncoming provider, anticipating need for admission.  Patient management required discussion with the following services or consulting groups:  None  Complexity of Problems Addressed Acute illness or injury that poses threat of life of bodily function  Additional Data Reviewed and Analyzed Further history obtained from: EMS on arrival and Prior labs/imaging results  Additional Factors Impacting ED Encounter Risk Consideration of hospitalization  Ozell HERO. Theadore, MD [facility] [email or web address]  Final Clinical Impressions(s) / ED Diagnoses     ICD-10-CM   1. Acute respiratory failure with hypoxia (HCC)  J96.01       ED Discharge Orders     None        Discharge Instructions Discussed with and Provided to Patient:   Discharge Instructions   None      Theadore Ozell HERO, MD 02/09/24 [phone]

## 2024-02-10 ENCOUNTER — Other Ambulatory Visit: Payer: Self-pay

## 2024-02-10 ENCOUNTER — Inpatient Hospital Stay (HOSPITAL_COMMUNITY)

## 2024-02-10 DIAGNOSIS — R0689 Other abnormalities of breathing: Secondary | ICD-10-CM

## 2024-02-10 DIAGNOSIS — E8729 Other acidosis: Secondary | ICD-10-CM

## 2024-02-10 DIAGNOSIS — E1142 Type 2 diabetes mellitus with diabetic polyneuropathy: Secondary | ICD-10-CM

## 2024-02-10 DIAGNOSIS — I5032 Chronic diastolic (congestive) heart failure: Secondary | ICD-10-CM

## 2024-02-10 DIAGNOSIS — J441 Chronic obstructive pulmonary disease with (acute) exacerbation: Secondary | ICD-10-CM

## 2024-02-10 DIAGNOSIS — I480 Paroxysmal atrial fibrillation: Secondary | ICD-10-CM

## 2024-02-10 DIAGNOSIS — J9621 Acute and chronic respiratory failure with hypoxia: Principal | ICD-10-CM

## 2024-02-10 DIAGNOSIS — Z8719 Personal history of other diseases of the digestive system: Secondary | ICD-10-CM

## 2024-02-10 DIAGNOSIS — K2100 Gastro-esophageal reflux disease with esophagitis, without bleeding: Secondary | ICD-10-CM

## 2024-02-10 DIAGNOSIS — D509 Iron deficiency anemia, unspecified: Secondary | ICD-10-CM

## 2024-02-10 DIAGNOSIS — E875 Hyperkalemia: Secondary | ICD-10-CM

## 2024-02-10 DIAGNOSIS — I739 Peripheral vascular disease, unspecified: Secondary | ICD-10-CM

## 2024-02-10 DIAGNOSIS — Z720 Tobacco use: Secondary | ICD-10-CM

## 2024-02-10 DIAGNOSIS — I471 Supraventricular tachycardia, unspecified: Secondary | ICD-10-CM

## 2024-02-10 DIAGNOSIS — I110 Hypertensive heart disease with heart failure: Secondary | ICD-10-CM

## 2024-02-10 DIAGNOSIS — Z79899 Other long term (current) drug therapy: Secondary | ICD-10-CM

## 2024-02-10 DIAGNOSIS — R0609 Other forms of dyspnea: Secondary | ICD-10-CM | POA: Diagnosis not present

## 2024-02-10 LAB — CBC
HCT: 42 % (ref 39.0–52.0)
Hemoglobin: 11.3 g/dL — ABNORMAL LOW (ref 13.0–17.0)
MCH: 25.3 pg — ABNORMAL LOW (ref 26.0–34.0)
MCHC: 26.9 g/dL — ABNORMAL LOW (ref 30.0–36.0)
MCV: 94 fL (ref 80.0–100.0)
Platelets: 203 K/uL (ref 150–400)
RBC: 4.47 MIL/uL (ref 4.22–5.81)
RDW: 14.2 % (ref 11.5–15.5)
WBC: 5.8 K/uL (ref 4.0–10.5)
nRBC: 0 % (ref 0.0–0.2)

## 2024-02-10 LAB — ECHOCARDIOGRAM COMPLETE
AR max vel: 2.25 cm2
AV Peak grad: 9.7 mmHg
Ao pk vel: 1.56 m/s
Area-P 1/2: 5.27 cm2
Calc EF: 33.7 %
Est EF: 45
Height: 73 in
S' Lateral: 4.6 cm
Single Plane A2C EF: 33.3 %
Single Plane A4C EF: 33 %
Weight: 3200 [oz_av]

## 2024-02-10 LAB — BLOOD GAS, VENOUS
Acid-Base Excess: 19.4 mmol/L — ABNORMAL HIGH (ref 0.0–2.0)
Acid-Base Excess: 16.9 mmol/L — ABNORMAL HIGH (ref 0.0–2.0)
Bicarbonate: 49.6 mmol/L — ABNORMAL HIGH (ref 20.0–28.0)
Bicarbonate: 46 mmol/L — ABNORMAL HIGH (ref 20.0–28.0)
Drawn by: 70495
O2 Saturation: 41.3 %
O2 Saturation: 83 %
Patient temperature: 37.2
Patient temperature: 35.9
pCO2, Ven: 95 mmHg (ref 44–60)
pCO2, Ven: 72 mmHg (ref 44–60)
pH, Ven: 7.33 (ref 7.25–7.43)
pH, Ven: 7.41 (ref 7.25–7.43)
pO2, Ven: <31 mmHg — CL (ref 32–45)
pO2, Ven: 46 mmHg — ABNORMAL HIGH (ref 32–45)

## 2024-02-10 LAB — RESPIRATORY PANEL BY PCR

## 2024-02-10 LAB — BASIC METABOLIC PANEL WITH GFR
Anion gap: 8 (ref 5–15)
BUN: 15 mg/dL (ref 8–23)
CO2: 41 mmol/L — ABNORMAL HIGH (ref 22–32)
Calcium: 9.2 mg/dL (ref 8.9–10.3)
Chloride: 94 mmol/L — ABNORMAL LOW (ref 98–111)
Creatinine, Ser: 0.83 mg/dL (ref 0.61–1.24)
GFR, Estimated: >60 mL/min (ref >60)
Glucose, Bld: 175 mg/dL — ABNORMAL HIGH (ref 70–99)
Potassium: 3.7 mmol/L (ref 3.5–5.1)
Sodium: 143 mmol/L (ref 135–145)

## 2024-02-10 LAB — RESP PANEL BY RT-PCR (RSV, FLU A&B, COVID)  RVPGX2
Influenza A by PCR: NEGATIVE
Influenza B by PCR: NEGATIVE
Resp Syncytial Virus by PCR: NEGATIVE
SARS Coronavirus 2 by RT PCR: NEGATIVE

## 2024-02-10 LAB — BLOOD GAS, ARTERIAL
Acid-Base Excess: 14.4 mmol/L — ABNORMAL HIGH (ref 0.0–2.0)
Bicarbonate: 43.4 mmol/L — ABNORMAL HIGH (ref 20.0–28.0)
O2 Saturation: 92.5 %
Patient temperature: 37.2
pCO2 arterial: 76 mmHg (ref 32–48)
pH, Arterial: 7.37 (ref 7.35–7.45)
pO2, Arterial: 62 mmHg — ABNORMAL LOW (ref 83–108)

## 2024-02-10 LAB — MAGNESIUM: Magnesium: 2.1 mg/dL (ref 1.7–2.4)

## 2024-02-10 MED ORDER — IPRATROPIUM-ALBUTEROL 0.5-2.5 (3) MG/3ML IN SOLN
3.0000 mL | Freq: Two times a day (BID) | RESPIRATORY_TRACT | Status: DC
  Administered 2024-02-11 – 2024-02-12 (×3): 3 mL via RESPIRATORY_TRACT
  Filled 2024-02-10 (×3): qty 3

## 2024-02-10 MED ORDER — SODIUM CHLORIDE 0.9 % IV SOLN
1.0000 g | INTRAVENOUS | Status: DC
  Administered 2024-02-10 – 2024-02-13 (×4): 1 g via INTRAVENOUS
  Filled 2024-02-10 (×4): qty 10

## 2024-02-10 NOTE — Progress Notes (Signed)
 Per RT, pt refused to keep bipap on; removed by RT.

## 2024-02-10 NOTE — Progress Notes (Addendum)
 HD#1 SUBJECTIVE:  Patient Summary: Jared [name] is a 68 y.o. with a pertinent PMH of COPD with baseline 3LNC, HFrEF, PAF on Xarelto , T2DM, CAD, IDA, TUD who presented with shortness of breath and admitted for COPD exacerbation.   Overnight Events: Patient refusing bipap overnight.   Interim History: Tachycardic. Still short of breath, especially when talking and ambulating to the bathroom. He thinks the nebulizer treatments have been helping but not sure. Also complaining of shaking of all his extremities, has been happening for months and during past hospitalizations but has never been told why this is.  His shaking seems to be concerning him greatly.  OBJECTIVE:  Vital Signs: Vitals:   02/10/24 0839 02/10/24 1052 02/10/24 1128 02/10/24 1459  BP:  132/78  125/80  Pulse:  96 (!) 166 79  Resp:  20  17  Temp:  99 F (37.2 C)  98 F (36.7 C)  TempSrc:  Oral  Oral  SpO2: 98% 96% 92% 98%  Weight:      Height:        Filed Weights   02/09/24 0651 02/10/24 0433  Weight: 95.8 kg 90.7 kg     Intake/Output Summary (Last 24 hours) at 02/10/2024 1526 Last data filed at 02/10/2024 1245 Gross per 24 hour  Intake 480 ml  Output 200 ml  Net 280 ml   Net IO Since Admission: -220 mL [02/10/24 1526]  Physical Exam: Constitutional: ill-appearing, well-nourished but in no acute distress HENT: normocephalic atraumatic, mucous membranes moist; wearing nasal cannula Eyes: conjunctiva non-erythematous, PERRL, no scleral icterus Cardiovascular: slightly tachycardic with normal rhythm, no m/r/g Pulmonary/Chest:  increased work of breathing on 3L [place], diffuse expiratory wheezing upon auscultation Abdominal: soft, non-tender, non-distended, bowel sounds normal MSK: normal bulk and tone Neurological: alert & oriented x3, minimal hand tremors bilaterally with outstretched arms; 5 out of 5 strength of hip flexion Skin: warm and dry Extremities: no edema or cyanosis; peripheral pulses intact Psych:  normal mood and affect, thought content normal  Patient Lines/Drains/Airways Status     Active Line/Drains/Airways     Name Placement date Placement time Site Days   Peripheral IV 02/09/24 18 G Left;Posterior Forearm 02/09/24  0603  Forearm  1            Pertinent labs and imaging:     Latest Ref Rng & Units 02/10/2024    2:47 AM 02/09/2024    6:44 AM 02/09/2024    6:37 AM  CBC  WBC 4.0 - 10.5 K/uL 5.8   13.6   Hemoglobin 13.0 - 17.0 g/dL 88.6  85.6  87.9   Hematocrit 39.0 - 52.0 % 42.0  42.0  45.5   Platelets 150 - 400 K/uL 203   228        Latest Ref Rng & Units 02/10/2024    2:47 AM 02/09/2024    6:44 AM 02/09/2024    6:37 AM  CMP  Glucose 70 - 99 mg/dL 824   857   BUN 8 - 23 mg/dL 15   6   Creatinine 9.38 - 1.24 mg/dL 9.16   9.23   Sodium 864 - 145 mmol/L 143  143  144   Potassium 3.5 - 5.1 mmol/L 3.7  3.4  3.8   Chloride 98 - 111 mmol/L 94   99   CO2 22 - 32 mmol/L 41   35   Calcium  8.9 - 10.3 mg/dL 9.2   8.7     No results found.  ASSESSMENT/PLAN:  Assessment: Principal Problem:   COPD with acute exacerbation (HCC) Active Problems:   CAD (coronary artery disease)   Cigarette smoker   Type 2 diabetes mellitus (HCC)   Chronic respiratory failure with hypoxia (HCC)   Iron  deficiency anemia due to chronic blood loss   Peripheral vascular disease (HCC)   PAF (paroxysmal atrial fibrillation) (HCC)   Heart failure with improved ejection fraction (HFimpEF) (HCC)   Hypercarbia   SVT (supraventricular tachycardia) (HCC)   Plan: #Acute hypercarbic respiratory failure w/ chronic hypoxic respiratory failure #COPD Exacerbation #Compensated Respiratory Acidosis Patient is still short of breath especially when ambulating to the bathroom.  He is not comfortable on BiPAP and question if he has been consistently using this despite encouragement from medical team. VBG showing PCO2 elevated to 90s, appropriate compensation with elevated bicarb.  ABG showing PCO2 of 76  with bicarb of 43.4.  Exam has not improved from yesterday - patient still with diffuse wheezing in all lung fields and increased work of breathing.  Mentation is normal with no signs of encephalopathy.  Acute hypercarbic respiratory failure in the setting of COPD exacerbation likely due to persistent smoking.  The patient has remained afebrile and has not had increased cough or sputum production from baseline. Will continue nebulizers and BiPAP but will check respiratory viral panel including COVID/flu.  Adding antibiotic coverage but will avoid azithromycin  due to prolonged QT. -Respiratory virus panel pending -Recheck VBG pending -Start IV ceftriaxone  1g daily (i9/1/25 - ) -Prednisone  40 mg starting 02/10/2024 for total 5 days of steroids (received methylpred in ED) -Continue budesonide /glycopyrrolate /formoterol  nebulizers -DuoNeb every 6 hours -Continue home  O2 of 3L  #CAD #Chest tightness Patient has history of CAD on cath in 2012, now presenting with chest tightness that comes and goes. Localizes to the right side of his chest. Initial troponin elevated to 52->64 > 38. EKG without signs of ST elevations. Chest tightness likely related to COPD exacerbation. Given low delta increase in troponins, suspect this may be in the setting of demand ischemia. Less likely due to ACS. -Nitroglycerin  sublingual 0.4mg  prn -Continue atorvastatin  80 mg daily -Continue aspirin  81 mg daily   #Supraventricular Tachycardia RN alerted team to sustained SVT with rates in the 150s spotted on telemetry.  EKG consistent with SVT.  This spontaneously resolved before any medical intervention.  Likely due to the use of nebulizer treatments. -Continue cardiac telemetry  #HFrEF Patient presented with acute on chronic SOB. Appears euvolemic; no crackles or leg swelling on exam. BNP is elevated to 462 which is only sign of heart failure at this time. Last echocardiogram on 05/02/2023 showing LVEF of 55-60%, with grade II  diastolic dysfunction. RV systolic function moderately reduced with moderately enlarged ventricular size. LA mildly dilated. Due to current symptoms we will order updated echocardiogram and continue home medications. -TTE ordered, pending -Continue home metoprolol  succinate 50 mg daily -Continue home Lasix  40 mg twice daily   #Hypertension Blood pressures have been elevated upon admission up to 160s/110s. Patient has also been reporting a generalized headache. Will restart on home medications given hypertension. -Acetaminophen  650 mg every 6 hours as needed for pain -Continue home hydralazine  25 mg 3 times daily -Continue home metoprolol  succinate 50 mg daily -Continue Imdur  30mg  daily  #Physiologic Tremor Patient is reporting shaking when he is in the hospital for several months now.  On exam there is minimal, if any, physiologic tremor present with outstretched arms bilaterally.  He is currently receiving inhaled LABA arformoterol   nebulizer which has a known side effect of increased tremor, nervousness, anxiety.  Enhanced physiologic tremor seems to be the most likely explanation for patient's symptoms as he only endorses these in the hospital where he receives medications that enhance tremor, as well as increased stress from illness/hospitalization. -Continue to monitor  #Iron  Deficiency Anemia #Small Bowel Angiectasias Appears stable, no signs or symptoms of bleeding. Hemoglobin on admission of 12.0, now down to 11.3. Has history of anemia due to chronic blood loss. GI performed EGD which showed esophagitis. Enteroscopy showed small bowel angioectasias which were treated with APC. He is also on bevacizumab  infusions. He follows with hematology and is on frequent IV iron  supplementation, last on 01/23/24. -trend CBCs   #Prolonged Qtc on EKG Patient was previously in SVT on EKG, but back in NSR. Both EKGs obtained in the ED showing prolonged QT.  Repeat EKG on 02/10/2024 showing QTc of  . -Avoid QT-prolonging medications   #PVD History of peripheral vascular disease in the left lower extremity. Vascular surgery performed catheterization on 08/02/2023 which showed atherosclerosis and recommended aspirin , Plavix , Eliquis, and statin with 4-week follow-up for ABI and duplex. The patient had symptomatic anemia with Plavix , so this was discontinued. Appropriate to continue home medications. Ensure proper outpatient follow-up with vascular surgery. -Continue atorvastatin  80 mg -Continue aspirin  81 mg -Continue Xarelto  20 mg   #T2DM Last A1c of 5.4% on 06/10/23. Appears well-controlled. Will consider sliding scale insulin  if glucoses continue to rise. -Continue to monitor   #TUD Patient reports smoking despite oxygen  use with history of COPD. Consider nicotine  patch if patient would like it.   #GERD #Esophagitis -continue pantoprazole  40mg  daily   #HLD -Continue Ezetimibe  10mg  daily -Continue atorvastatin  80mg  daily   #Peripheral Neropathy -continue home gabapentin  300mg  bid  Best Practice: Diet: Regular diet IVF: Fluids: None, Rate: None VTE: DOAC Code: Full  Disposition planning: DISPO: Anticipated discharge in 1-2 days to Home pending clinical improvement.  Signature:  Letha Cheadle, MD [facility]  PGY-1 02/10/2024, 3:26 PM  On Call pager [phone]

## 2024-02-10 NOTE — Progress Notes (Signed)
 Echocardiogram 2D Echocardiogram has been performed.  Jared [name] 02/10/2024, 4:03 PM

## 2024-02-10 NOTE — Evaluation (Signed)
 Physical Therapy Evaluation Patient Details Name: Jared [name] MRN: [id number] DOB: 1956-03-18 Today's Date: 02/10/2024  History of Present Illness  68 yo male adm 02/09/24 with SOB and COPD exacerbation. PMHx: perforated appendicitis, syncope, AVM, CABG, CAD, CAP, HFrEF, COPD on 2-3L home O2, CVA, HTN, A flutter, DM2  Clinical Impression  Pt pleasant with noted UB jerking sitting EOB and in chair end of session. Pt reports intermittent jerking for a year without falls. Pt limited by tachycardia with activity. HR 89-110 at rest and with initial standing up to 160, seated rest and was able to recover to 110 in 2 min, walk long hall and then HR flipped back to tachycardia again requiring seated rest. Pt on 4L during activity stating he has increased O2 requirements of 3-4L at home currently. Pt with decreased activity tolerance due to cardiopulmonary function and reports PCA assists a few days a week. Pt will benefit from acute therapy to maximize mobility and safety.         If plan is discharge home, recommend the following: Assistance with cooking/housework   Can travel by private vehicle        Equipment Recommendations None recommended by PT  Recommendations for Other Services       Functional Status Assessment Patient has had a recent decline in their functional status and/or demonstrates limited ability to make significant improvements in function in a reasonable and predictable amount of time     Precautions / Restrictions Precautions Precautions: Other (comment) Recall of Precautions/Restrictions: Intact Precaution/Restrictions Comments: watch HR and SPO2, UB jerking at times      Mobility  Bed Mobility Overal bed mobility: Modified Independent                  Transfers Overall transfer level: Modified independent                      Ambulation/Gait Ambulation/Gait assistance: Supervision Gait Distance (Feet): 200 Feet Assistive device: Rolling walker  (2 wheels) Gait Pattern/deviations: Step-through pattern, Decreased stride length   Gait velocity interpretation: 1.31 - 2.62 ft/sec, indicative of limited community ambulator   General Gait Details: pt walked 200' then required seated rest due to HR up to 155bpm. With grossly 1 min seated rest HR returned to 98 and able to return ambulate 200' to room with RW on 4L at 92%  Stairs Stairs:  (deferred secondary to HR)          Wheelchair Mobility     Tilt Bed    Modified Rankin (Stroke Patients Only)       Balance Overall balance assessment: Mild deficits observed, not formally tested                                           Pertinent Vitals/Pain Pain Assessment Pain Assessment: No/denies pain    Home Living Family/patient expects to be discharged to:: Private residence Living Arrangements: Non-relatives/Friends Available Help at Discharge: Friend(s) Type of Home: House Home Access: Stairs to enter Entrance Stairs-Rails: Can reach both;Left;Right Entrance Stairs-Number of Steps: 3   Home Layout: One level Home Equipment: Rollator (4 wheels);Shower seat;Grab bars - tub/shower;BSC/3in1 Additional Comments: 3-4 L O2    Prior Function Prior Level of Function : Independent/Modified Independent;History of Falls (last six months)             Mobility Comments: rollator ADLs  Comments: Mod I with ADL/IADLs, sister or medicaid provides transportation, PCA for IADLs     Extremity/Trunk Assessment   Upper Extremity Assessment Upper Extremity Assessment: Overall WFL for tasks assessed (intermittent jerking)    Lower Extremity Assessment Lower Extremity Assessment: Overall WFL for tasks assessed    Cervical / Trunk Assessment Cervical / Trunk Assessment: Normal  Communication   Communication Communication: No apparent difficulties    Cognition Arousal: Alert Behavior During Therapy: WFL for tasks assessed/performed   PT - Cognitive  impairments: No apparent impairments                         Following commands: Intact       Cueing Cueing Techniques: Verbal cues     General Comments      Exercises     Assessment/Plan    PT Assessment Patient needs continued PT services  PT Problem List Decreased activity tolerance;Decreased mobility;Cardiopulmonary status limiting activity       PT Treatment Interventions DME instruction;Gait training;Stair training;Functional mobility training;Patient/family education;Therapeutic activities    PT Goals (Current goals can be found in the Care Plan section)  Acute Rehab PT Goals Patient Stated Goal: return home without jerking PT Goal Formulation: With patient Time For Goal Achievement: 02/24/24 Potential to Achieve Goals: Good    Frequency Min 1X/week     Co-evaluation               AM-PAC PT 6 Clicks Mobility  Outcome Measure Help needed turning from your back to your side while in a flat bed without using bedrails?: None Help needed moving from lying on your back to sitting on the side of a flat bed without using bedrails?: None Help needed moving to and from a bed to a chair (including a wheelchair)?: None Help needed standing up from a chair using your arms (e.g., wheelchair or bedside chair)?: A Little Help needed to walk in hospital room?: A Little Help needed climbing 3-5 steps with a railing? : A Little 6 Click Score: 21    End of Session   Activity Tolerance: Patient tolerated treatment well Patient left: in chair;with call bell/phone within reach;with chair alarm set Nurse Communication: Mobility status PT Visit Diagnosis: Other abnormalities of gait and mobility (R26.89)    Time: 8990-8962 PT Time Calculation (min) (ACUTE ONLY): 28 min   Charges:   PT Evaluation $PT Eval Low Complexity: 1 Low PT Treatments $Gait Training: 8-22 mins PT General Charges $$ ACUTE PT VISIT: 1 Visit         Lenoard SQUIBB, PT Acute  Rehabilitation Services Office: [phone]   Lenoard NOVAK Sayvon Arterberry 02/10/2024, 11:35 AM

## 2024-02-11 ENCOUNTER — Other Ambulatory Visit: Payer: Self-pay

## 2024-02-11 ENCOUNTER — Other Ambulatory Visit: Payer: Self-pay | Admitting: Hematology

## 2024-02-11 DIAGNOSIS — I2510 Atherosclerotic heart disease of native coronary artery without angina pectoris: Secondary | ICD-10-CM

## 2024-02-11 DIAGNOSIS — Z7982 Long term (current) use of aspirin: Secondary | ICD-10-CM

## 2024-02-11 DIAGNOSIS — I5043 Acute on chronic combined systolic (congestive) and diastolic (congestive) heart failure: Secondary | ICD-10-CM

## 2024-02-11 DIAGNOSIS — Z7951 Long term (current) use of inhaled steroids: Secondary | ICD-10-CM

## 2024-02-11 DIAGNOSIS — Z7901 Long term (current) use of anticoagulants: Secondary | ICD-10-CM

## 2024-02-11 DIAGNOSIS — Z7952 Long term (current) use of systemic steroids: Secondary | ICD-10-CM

## 2024-02-11 DIAGNOSIS — F17210 Nicotine dependence, cigarettes, uncomplicated: Secondary | ICD-10-CM

## 2024-02-11 DIAGNOSIS — Z9981 Dependence on supplemental oxygen: Secondary | ICD-10-CM

## 2024-02-11 LAB — CBC
HCT: 39.2 % (ref 39.0–52.0)
Hemoglobin: 10.7 g/dL — ABNORMAL LOW (ref 13.0–17.0)
MCH: 25.7 pg — ABNORMAL LOW (ref 26.0–34.0)
MCHC: 27.3 g/dL — ABNORMAL LOW (ref 30.0–36.0)
MCV: 94 fL (ref 80.0–100.0)
Platelets: 212 K/uL (ref 150–400)
RBC: 4.17 MIL/uL — ABNORMAL LOW (ref 4.22–5.81)
RDW: 14.6 % (ref 11.5–15.5)
WBC: 10.9 K/uL — ABNORMAL HIGH (ref 4.0–10.5)
nRBC: 0 % (ref 0.0–0.2)

## 2024-02-11 LAB — BASIC METABOLIC PANEL WITH GFR
Anion gap: 12 (ref 5–15)
BUN: 14 mg/dL (ref 8–23)
CO2: 38 mmol/L — ABNORMAL HIGH (ref 22–32)
Calcium: 8.8 mg/dL — ABNORMAL LOW (ref 8.9–10.3)
Chloride: 94 mmol/L — ABNORMAL LOW (ref 98–111)
Creatinine, Ser: 0.76 mg/dL (ref 0.61–1.24)
GFR, Estimated: >60 mL/min (ref >60)
Glucose, Bld: 133 mg/dL — ABNORMAL HIGH (ref 70–99)
Potassium: 3.7 mmol/L (ref 3.5–5.1)
Sodium: 144 mmol/L (ref 135–145)

## 2024-02-11 NOTE — Progress Notes (Signed)
 Heart Failure Navigator Progress Note  Assessed for Heart & Vascular TOC clinic readiness.  Patient does not meet criteria due to  admitted with COPD exacerbation. Has a scheduled follow up on 02/24/2024. No HF TOC. .   Navigator will sign off at this time.   Stephane Haddock, BSN, Scientist, clinical (histocompatibility and immunogenetics) Only

## 2024-02-11 NOTE — TOC Initial Note (Signed)
 Transition of Care [facility]) - Initial/Assessment Note    Patient Details  Name: Jared [name] MRN: [id number] Date of Birth: 1955-11-12  Transition of Care [facility]) CM/SW Contact:    Waddell Barnie Rama, RN Phone Number: 02/11/2024, 4:02 PM  Clinical Narrative:                 Lives in a boarding home, has PCP and insurance on file, states has no HH services in place at this time but he does have an aide with [employer] from 1:30 to 4:30 Mon- Friday , has home oxygen  3 liters with [employer], ( it is a portable one and it is pulser and not continousl) and a walker, and a rollator, shower chair , nebulizer at home.  States family member(sister) will transport them home at [employer] and family is support system, states gets medications from  [facility].  Pta self ambulatory.   [employer] is working on getting NIV approved  .      Expected Discharge Plan: Home/Self Care Barriers to Discharge: Continued Medical Work up   Patient Goals and CMS Choice Patient states their goals for this hospitalization and ongoing recovery are:: return to boarding room   Choice offered to / list presented to : NA      Expected Discharge Plan and Services In-house Referral: NA Discharge Planning Services: CM Consult Post Acute Care Choice: NA Living arrangements for the past 2 months: Boarding House                 DME Arranged: N/A DME Agency: NA       HH Arranged: NA          Prior Living Arrangements/Services Living arrangements for the past 2 months: [employer] Lives with:: Self Patient language and need for interpreter reviewed:: Yes Do you feel safe going back to the place where you live?: Yes      Need for Family Participation in Patient Care: Yes (Comment) Care giver support system in place?: No (comment) Current home services: DME (home oxygen  3 liters portable (pulse),walker, rollator, shower chair, nebulizer.) Criminal Activity/Legal Involvement Pertinent to Current Situation/Hospitalization: No -  Comment as needed  Activities of Daily Living   ADL Screening (condition at time of admission) Independently performs ADLs?: No Does the patient have a NEW difficulty with bathing/dressing/toileting/self-feeding that is expected to last >3 days?: No Does the patient have a NEW difficulty with getting in/out of bed, walking, or climbing stairs that is expected to last >3 days?: No Does the patient have a NEW difficulty with communication that is expected to last >3 days?: No Is the patient deaf or have difficulty hearing?: No Does the patient have difficulty seeing, even when wearing glasses/contacts?: No Does the patient have difficulty concentrating, remembering, or making decisions?: No  Permission Sought/Granted Permission sought to share information with : Case Manager Permission granted to share information with : Yes, Verbal Permission Granted  Share Information with NAME: Jacquline  Permission granted to share info w AGENCY: [employer]  Permission granted to share info w Relationship: sister     Emotional Assessment Appearance:: Appears stated age Attitude/Demeanor/Rapport: Engaged Affect (typically observed): Appropriate Orientation: : Oriented to Self, Oriented to Place, Oriented to Situation, Oriented to  Time Alcohol / Substance Use: Not Applicable Psych Involvement: No (comment)  Admission diagnosis:  SVT (supraventricular tachycardia) (HCC) [I47.10] COPD exacerbation (HCC) [J44.1] Acute respiratory failure with hypoxia (HCC) [J96.01] Uncontrolled hypertension [I10] COPD with acute exacerbation (HCC) [J44.1] Acute on chronic  combined systolic and diastolic CHF (congestive heart failure) (HCC) [I50.43] Patient Active Problem List   Diagnosis Date Noted   Hypercarbia 02/10/2024   SVT (supraventricular tachycardia) (HCC) 02/10/2024   COPD with acute exacerbation (HCC) 02/09/2024   AVM (arteriovenous malformation) of small bowel, 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/19/2023   Chronic health  problem 08/23/2023   Anemia 08/22/2023   Acute on chronic anemia 08/22/2023   Thrombocytosis 08/22/2023   Left leg pain 08/22/2023   Heme positive stool 05/19/2023   NSVT (nonsustained ventricular tachycardia) (HCC) 05/19/2023   Melena 05/18/2023   Chronic anticoagulation 05/18/2023   Heart failure with improved ejection fraction (HFimpEF) (HCC) 05/12/2023   HFrEF (heart failure with reduced ejection fraction) (HCC) 05/06/2023   Malnutrition of moderate degree 05/01/2023   Sepsis (HCC) 04/30/2023   History of CVA (cerebrovascular accident) 04/30/2023   PAF (paroxysmal atrial fibrillation) (HCC) 12/27/2022   Atherosclerosis of aorta (HCC) 10/25/2022   Peripheral vascular disease (HCC) 10/25/2022   Weakness 03/21/2022   Syncope and collapse 02/07/2021   History of GI bleed    Gastric ulcer due to Helicobacter pylori and nonsteroidal anti-inflammatory drug (NSAID)    Epigastric pain    Chronic right maxillary sinusitis 01/29/2019   Upper GI bleed 10/27/2018   Anticoagulated 09/27/2018   Angiectasia of gastrointestinal tract    Iron  deficiency anemia due to chronic blood loss    Symptomatic anemia 09/04/2018   Tobacco use 09/04/2018   Noncompliance 06/14/2017   Elevated LFTs 08/13/2016   Chronic respiratory failure with hypoxia (HCC) 07/24/2016   Atrial flutter (HCC)    Hepatic congestion 07/02/2016   Type 2 diabetes mellitus (HCC) 05/14/2016   COPD GOLD III/still smoking  02/29/2016   Cigarette smoker 01/09/2016   CAD (coronary artery disease) 10/07/2013   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, [address] [place] [address] Phone: [phone] Fax: [phone]     Social Drivers of Health (SDOH) Social History: SDOH Screenings   Food Insecurity: No Food Insecurity  (02/09/2024)  Housing: Low Risk  (02/09/2024)  Transportation Needs: No Transportation Needs (02/09/2024)  Utilities: Not At Risk (02/09/2024)  Alcohol Screen: Low Risk  (06/10/2023)  Depression (PHQ2-9): Low Risk  (02/07/2024)  Financial Resource Strain: Low Risk  (06/10/2023)  Physical Activity: Insufficiently Active (06/10/2023)  Social Connections: Moderately Isolated (02/09/2024)  Stress: No Stress Concern Present (06/10/2023)  Tobacco Use: High Risk (02/09/2024)  Health Literacy: Adequate Health Literacy (06/10/2023)   SDOH Interventions:     Readmission Risk Interventions    02/11/2024    3:54 PM 12/27/2022    2:20 PM 12/14/2021    1:38 PM  Readmission Risk Prevention Plan  Post Dischage Appt  Complete Complete  Medication Screening  Complete Complete  Transportation Screening Complete Complete Complete  PCP or Specialist Appt within 3-5 Days Complete    HRI or Home Care Consult Complete    Palliative Care Screening Not Applicable    Medication Review (RN Care Manager) Complete

## 2024-02-11 NOTE — Progress Notes (Signed)
 Mobility Specialist Progress Note:    02/11/24 1507  Mobility  Activity Ambulated with assistance  Level of Assistance Standby assist, set-up cues, supervision of patient - no hands on  Assistive Device None  Distance Ambulated (ft) 200 ft  Activity Response Tolerated fair  Mobility Referral Yes  Mobility visit 1 Mobility  Mobility Specialist Start Time (ACUTE ONLY) 1507  Mobility Specialist Stop Time (ACUTE ONLY) 1515  Mobility Specialist Time Calculation (min) (ACUTE ONLY) 8 min   Pt in recliner upon arrival agreeable to session. Pt c/o of lower leg tightness and lungs feeling full filling. Pt able to otherwise move and ambulate well. Returned pt to recliner w/ all needs met.  Venetia Keel Mobility Specialist Please Neurosurgeon or Rehab Office at [phone]

## 2024-02-11 NOTE — Plan of Care (Signed)
  Problem: Education: Goal: Knowledge of General Education information will improve Description: Including pain rating scale, medication(s)/side effects and non-pharmacologic comfort measures Outcome: Progressing   Problem: Health Behavior/Discharge Planning: Goal: Ability to manage health-related needs will improve Outcome: Progressing   Problem: Clinical Measurements: Goal: Will remain free from infection Outcome: Progressing Goal: Diagnostic test results will improve Outcome: Progressing   Problem: Activity: Goal: Risk for activity intolerance will decrease Outcome: Progressing   Problem: Coping: Goal: Level of anxiety will decrease Outcome: Progressing   Problem: Elimination: Goal: Will not experience complications related to urinary retention Outcome: Progressing   Problem: Safety: Goal: Ability to remain free from injury will improve Outcome: Progressing   Problem: Skin Integrity: Goal: Risk for impaired skin integrity will decrease Outcome: Progressing   Problem: Clinical Measurements: Goal: Respiratory complications will improve Outcome: Not Progressing Goal: Cardiovascular complication will be avoided Outcome: Not Progressing   Problem: Pain Managment: Goal: General experience of comfort will improve and/or be controlled Outcome: Not Progressing

## 2024-02-11 NOTE — TOC Progression Note (Addendum)
 Transition of Care [facility]) - Progression Note    Patient Details  Name: Jared [name] MRN: [id number] Date of Birth: Apr 23, 1956  [facility]) CM/SW Contact  Waddell Barnie Rama, RN Phone Number: 02/11/2024, 11:16 AM  Clinical Narrative:    NCM received notification patient needs a bipap machine,  NCM made referral to Mitch/ Zach  with [employer],  awaiting to hear back.  Per Thomasina , he will email the order to this NCM for NIV .  Awaiting order and narrative.                     Expected Discharge Plan and Services                                               Social Drivers of Health (SDOH) Interventions SDOH Screenings   Food Insecurity: No Food Insecurity (02/09/2024)  Housing: Low Risk  (02/09/2024)  Transportation Needs: No Transportation Needs (02/09/2024)  Utilities: Not At Risk (02/09/2024)  Alcohol Screen: Low Risk  (06/10/2023)  Depression (PHQ2-9): Low Risk  (02/07/2024)  Financial Resource Strain: Low Risk  (06/10/2023)  Physical Activity: Insufficiently Active (06/10/2023)  Social Connections: Moderately Isolated (02/09/2024)  Stress: No Stress Concern Present (06/10/2023)  Tobacco Use: High Risk (02/09/2024)  Health Literacy: Adequate Health Literacy (06/10/2023)    Readmission Risk Interventions    12/27/2022    2:20 PM 12/14/2021    1:38 PM  Readmission Risk Prevention Plan  Post Dischage Appt Complete Complete  Medication Screening Complete Complete  Transportation Screening Complete Complete

## 2024-02-11 NOTE — Progress Notes (Signed)
 HD#2 SUBJECTIVE:  Patient Summary: Jared [name] is a 68 y.o. with a pertinent PMH of COPD with baseline 3LNC, HFrEF, PAF on Xarelto , T2DM, CAD, IDA, TUD who presented with shortness of breath and admitted for COPD exacerbation.   Overnight Events: None  Interim History:  Updated patient's sister Jared [name]; [phone]) via phone last night.  Patient is still shaking a little but not during rounds this morning.  Has not noticed any blood in urine or stool.  Still short of breath with cough that has not changed from baseline.  Was wearing BiPAP overnight and discontinued to eat breakfast, but this was continued.  RN alerted team just before rounds about patient's headache and he took 1 Tylenol .   OBJECTIVE:  Vital Signs: Vitals:   02/11/24 0500 02/11/24 0715 02/11/24 0819 02/11/24 1130  BP: (!) 158/97 (!) 141/82  (!) 124/92  Pulse: 79 78  75  Resp: 18 17  17   Temp: 98.4 F (36.9 C) 97.7 F (36.5 C)  97.7 F (36.5 C)  TempSrc: Oral Oral  Oral  SpO2: 100% 100% 100% 98%  Weight: 91.4 kg     Height:        Filed Weights   02/09/24 0651 02/10/24 0433 02/11/24 0500  Weight: 95.8 kg 90.7 kg 91.4 kg     Intake/Output Summary (Last 24 hours) at 02/11/2024 1154 Last data filed at 02/11/2024 9155 Gross per 24 hour  Intake 1060 ml  Output 350 ml  Net 710 ml   Net IO Since Admission: 250 mL [02/11/24 1154]  Physical Exam: Constitutional: ill-appearing, well-nourished but in no acute distress HENT: normocephalic atraumatic, mucous membranes moist; wearing nasal cannula Eyes: conjunctiva non-erythematous, PERRL, no scleral icterus Cardiovascular: slightly tachycardic with normal rhythm, no m/r/g Pulmonary/Chest: Normal work of breathing on BiPAP machine, diffuse expiratory wheezing with rhonchorous breath sounds upon auscultation Abdominal: soft, non-tender, non-distended, bowel sounds normal MSK: normal bulk and tone Neurological: alert & oriented x3, minimal hand tremors  bilaterally with outstretched arms Skin: warm and dry Extremities: no edema or cyanosis; peripheral pulses intact Psych: normal mood and affect, thought content normal  Patient Lines/Drains/Airways Status     Active Line/Drains/Airways     Name Placement date Placement time Site Days   Peripheral IV 02/09/24 18 G Left;Posterior Forearm 02/09/24  0603  Forearm  1            Pertinent labs and imaging:     Latest Ref Rng & Units 02/11/2024    2:59 AM 02/10/2024    2:47 AM 02/09/2024    6:44 AM  CBC  WBC 4.0 - 10.5 K/uL 10.9  5.8    Hemoglobin 13.0 - 17.0 g/dL 89.2  88.6  85.6   Hematocrit 39.0 - 52.0 % 39.2  42.0  42.0   Platelets 150 - 400 K/uL 212  203         Latest Ref Rng & Units 02/11/2024    2:59 AM 02/10/2024    2:47 AM 02/09/2024    6:44 AM  CMP  Glucose 70 - 99 mg/dL 866  824    BUN 8 - 23 mg/dL 14  15    Creatinine 9.38 - 1.24 mg/dL 9.23  9.16    Sodium 864 - 145 mmol/L 144  143  143   Potassium 3.5 - 5.1 mmol/L 3.7  3.7  3.4   Chloride 98 - 111 mmol/L 94  94    CO2 22 - 32 mmol/L 38  41  Calcium  8.9 - 10.3 mg/dL 8.8  9.2      ECHOCARDIOGRAM COMPLETE Result Date: 02/10/2024    ECHOCARDIOGRAM REPORT   Patient Name:   Jared [name] Date of Exam: 02/10/2024 Medical Rec #:  [id number]    Height:       73.0 in Accession #:    [id number]   Weight:       200.0 lb Date of Birth:  [birth year]     BSA:          2.152 m Patient Age:    68 years     BP:           132/78 mmHg Patient Gender: M            HR:           72 bpm. Exam Location:  Inpatient Procedure: 2D Echo, Cardiac Doppler and Color Doppler (Both Spectral and Color            Flow Doppler were utilized during procedure). Indications:    Dyspnea R06.00  History:        Patient has prior history of Echocardiogram examinations, most                 recent 05/02/2023. CHF and Cardiomyopathy, CAD, COPD,                 Arrythmias:Atrial Fibrillation, Atrial Flutter and Tachycardia,                 Signs/Symptoms:Syncope; Risk  Factors:Hypertension, Diabetes and                 Current Smoker.  Sonographer:    Thea Norlander RCS Referring Phys: [id number] GRACE LAU IMPRESSIONS  1. Left ventricular ejection fraction, by estimation, is 45%. The left ventricle has mildly decreased function. The left ventricle demonstrates global hypokinesis. The left ventricular internal cavity size was mildly dilated. There is mild eccentric left ventricular hypertrophy. Left ventricular diastolic parameters are consistent with Grade II diastolic dysfunction (pseudonormalization). Elevated left atrial pressure.  2. Right ventricular systolic function is normal. The right ventricular size is mildly enlarged. Tricuspid regurgitation signal is inadequate for assessing PA pressure.  3. Left atrial size was moderately dilated.  4. Right atrial size was moderately dilated.  5. A small pericardial effusion is present. The pericardial effusion is circumferential. There is no evidence of cardiac tamponade.  6. The mitral valve is normal in structure. Trivial mitral valve regurgitation. No evidence of mitral stenosis.  7. The aortic valve is tricuspid. There is mild calcification of the aortic valve. There is mild thickening of the aortic valve. Aortic valve regurgitation is not visualized. Aortic valve sclerosis/calcification is present, without any evidence of aortic stenosis.  8. Aortic dilatation noted. There is borderline dilatation of the aortic root, measuring 38 mm. There is mild dilatation of the ascending aorta, measuring 44 mm.  9. The inferior vena cava is dilated in size with <50% respiratory variability, suggesting right atrial pressure of 15 mmHg. Comparison(s): No significant change from prior study. Prior images reviewed side by side. The left ventricular function is unchanged. Left ventricular systolic function appears to be overestimated on the 05/02/2023 study, but is similar to the 12/13/2021 report. FINDINGS  Left Ventricle: Left ventricular  ejection fraction, by estimation, is 45%. The left ventricle has mildly decreased function. The left ventricle demonstrates global hypokinesis. The left ventricular internal cavity size was mildly dilated. There is mild eccentric left ventricular hypertrophy. Left ventricular diastolic  function could not be evaluated due to atrial fibrillation. Left ventricular diastolic parameters are consistent with Grade II diastolic dysfunction (pseudonormalization). Elevated left atrial pressure. Right Ventricle: The right ventricular size is mildly enlarged. No increase in right ventricular wall thickness. Right ventricular systolic function is normal. Tricuspid regurgitation signal is inadequate for assessing PA pressure. Left Atrium: Left atrial size was moderately dilated. Right Atrium: Right atrial size was moderately dilated. Pericardium: A small pericardial effusion is present. The pericardial effusion is circumferential. There is no evidence of cardiac tamponade. Mitral Valve: The mitral valve is normal in structure. Mild mitral annular calcification. Trivial mitral valve regurgitation. No evidence of mitral valve stenosis. Tricuspid Valve: The tricuspid valve is normal in structure. Tricuspid valve regurgitation is not demonstrated. Aortic Valve: The aortic valve is tricuspid. There is mild calcification of the aortic valve. There is mild thickening of the aortic valve. Aortic valve regurgitation is not visualized. Aortic valve sclerosis/calcification is present, without any evidence of aortic stenosis. Aortic valve peak gradient measures 9.7 mmHg. Pulmonic Valve: The pulmonic valve was normal in structure. Pulmonic valve regurgitation is not visualized. No evidence of pulmonic stenosis. Aorta: Aortic dilatation noted. There is borderline dilatation of the aortic root, measuring 38 mm. There is mild dilatation of the ascending aorta, measuring 44 mm. Venous: The inferior vena cava is dilated in size with less than 50%  respiratory variability, suggesting right atrial pressure of 15 mmHg. IAS/Shunts: No atrial level shunt detected by color flow Doppler.  LEFT VENTRICLE PLAX 2D LVIDd:         5.80 cm      Diastology LVIDs:         4.60 cm      LV e' medial:    6.45 cm/s LV PW:         1.15 cm      LV E/e' medial:  19.4 LV IVS:        1.15 cm      LV e' lateral:   7.73 cm/s LVOT diam:     2.00 cm      LV E/e' lateral: 16.2 LV SV:         64 LV SV Index:   30 LVOT Area:     3.14 cm  LV Volumes (MOD) LV vol d, MOD A2C: 121.0 ml LV vol d, MOD A4C: 115.0 ml LV vol s, MOD A2C: 80.7 ml LV vol s, MOD A4C: 77.1 ml LV SV MOD A2C:     40.3 ml LV SV MOD A4C:     43.9 ml LV SV MOD BP:      39.9 ml RIGHT VENTRICLE             IVC RV S prime:     16.00 cm/s  IVC diam: 3.30 cm TAPSE (M-mode): 2.5 cm LEFT ATRIUM             Index        RIGHT ATRIUM           Index LA diam:        4.70 cm 2.18 cm/m   RA Area:     28.60 cm LA Vol (A2C):   85.4 ml 39.69 ml/m  RA Volume:   101.50 ml 47.17 ml/m LA Vol (A4C):   79.6 ml 37.00 ml/m LA Biplane Vol: 79.1 ml 36.76 ml/m  AORTIC VALVE AV Area (Vmax): 2.25 cm AV Vmax:        156.00 cm/s AV Peak Grad:  9.7 mmHg LVOT Vmax:      111.67 cm/s LVOT Vmean:     70.200 cm/s LVOT VTI:       0.205 m  AORTA Ao Root diam: 3.80 cm Ao Asc diam:  4.40 cm MITRAL VALVE MV Area (PHT): 5.27 cm     SHUNTS MV Decel Time: 144 msec     Systemic VTI:  0.20 m MV E velocity: 125.00 cm/s  Systemic Diam: 2.00 cm MV A velocity: 41.90 cm/s MV E/A ratio:  2.98 Mihai Croitoru MD Electronically signed by Jerel Balding MD Signature Date/Time: 02/10/2024/4:58:48 PM    Final      ASSESSMENT/PLAN:  Assessment: Principal Problem:   COPD with acute exacerbation (HCC) Active Problems:   CAD (coronary artery disease)   Cigarette smoker   Type 2 diabetes mellitus (HCC)   Chronic respiratory failure with hypoxia (HCC)   Iron  deficiency anemia due to chronic blood loss   Peripheral vascular disease (HCC)   PAF (paroxysmal atrial  fibrillation) (HCC)   Heart failure with improved ejection fraction (HFimpEF) (HCC)   Hypercarbia   SVT (supraventricular tachycardia) (HCC)   Plan: #Acute hypercarbic respiratory failure w/ chronic hypoxic respiratory failure #COPD Exacerbation #Compensated Respiratory Acidosis Patient is still short of breath today.  He has been consistently using his BiPAP. VBG showing PCO2 elevated to 90s, appropriate compensation with elevated bicarb.  ABG showing PCO2 of 76 with bicarb of 43.4.  Exam with mild improvement of wheezing which is still present in all lung fields.  Now with rhonchi.  Mentation is normal with no signs of encephalopathy.  Acute hypercarbic respiratory failure in the setting of COPD exacerbation likely due to persistent smoking.  The patient has remained afebrile and has not had increased cough or sputum production from baseline. Will continue nebulizers, BiPAP, and antibiotics.  Consider ambulatory oxygen  saturation monitoring with PT later this afternoon if breathing improves. -Respiratory virus panel negative -Continue IV ceftriaxone  1g daily (i9/1/25 - ) -Prednisone  40 mg starting 02/10/2024 for total 5 days of steroids (received methylpred in the ED) -Continue budesonide /glycopyrrolate /formoterol  nebulizers -DuoNeb every 4 hours -Continue bipap will trial weaning to home 3L [place]  #CAD #Chest tightness Patient has history of CAD on cath in 2012, now presenting with chest tightness that comes and goes. Localizes to the right side of his chest. Initial troponin elevated to 52->64->38. EKG without signs of ST elevations. Chest tightness likely related to COPD exacerbation. Given low delta increase in troponins, suspect this may be in the setting of demand ischemia. Less likely due to ACS. -Nitroglycerin  sublingual 0.4mg  prn -Continue atorvastatin  80 mg daily -Continue aspirin  81 mg daily   #Supraventricular Tachycardia #PAF RN alerted team to sustained SVT with rates in the  150s spotted on telemetry.  EKG consistent with SVT.  This spontaneously resolved before any medical intervention.  Likely due to the use of nebulizer treatments. -Continue cardiac telemetry -Continue Xarelto  20 mg  #HFrEF Patient presented with acute on chronic SOB. Appears euvolemic; no crackles or leg swelling on exam. BNP is elevated to 462 which is only sign of heart failure at this time. Updated TTE showing LVEF down to 45% w/ global hypokinesis, LVH, and grade II diastolic dysfunction. RV w/ normal systolic function, dilated RA/LA. We will continue home medications. -Continue home metoprolol  succinate 50 mg daily -Continue home Lasix  40 mg twice daily   #Hypertension Blood pressures have been elevated upon admission up to 160s/110s. Patient has also been reporting a generalized headache. Will restart  on home medications given hypertension. -Acetaminophen  650 mg every 6 hours as needed for pain -Continue home hydralazine  25 mg 3 times daily -Continue home metoprolol  succinate 50 mg daily -Continue Imdur  30mg  daily  #Physiologic Tremor Patient is reporting shaking when he is in the hospital for several months now.  On exam there is minimal, if any, physiologic tremor present with outstretched arms bilaterally.  He is currently receiving inhaled LABA arformoterol  nebulizer which has a known side effect of increased tremor, nervousness, anxiety.  Enhanced physiologic tremor seems to be the most likely explanation for patient's symptoms as he only endorses these in the hospital where he receives medications that enhance tremor, as well as increased stress from illness/hospitalization. -Continue to monitor  #Iron  Deficiency Anemia #Small Bowel Angiectasias Appears stable, no signs or symptoms of bleeding. Hemoglobin on admission of 12.0, now down to 11.3. Has history of anemia due to chronic blood loss. GI performed EGD which showed esophagitis. Enteroscopy showed small bowel angioectasias  which were treated with APC. He is also on bevacizumab  infusions. He follows with hematology and is on frequent IV iron  supplementation, last on 01/23/24. -trend CBCs   #Prolonged Qtc on EKG Patient was previously in SVT on EKG, but back in NSR. Both EKGs obtained in the ED showing prolonged QT.  Repeat EKG on 02/10/2024 showing QTc of . -Avoid QT-prolonging medications   #PVD History of peripheral vascular disease in the left lower extremity. Vascular surgery performed catheterization on 08/02/2023 which showed atherosclerosis and recommended aspirin , Plavix , Eliquis, and statin with 4-week follow-up for ABI and duplex. The patient had symptomatic anemia with Plavix , so this was discontinued. Appropriate to continue home medications. Ensure proper outpatient follow-up with vascular surgery. -Continue atorvastatin  80 mg -Continue aspirin  81 mg  #T2DM Last A1c of 5.4% on 06/10/23. Appears well-controlled. Will consider sliding scale insulin  if glucoses continue to rise. -Continue to monitor   #TUD Patient reports smoking despite oxygen  use with history of COPD. Consider nicotine  patch if patient would like it.   #GERD #Esophagitis -continue pantoprazole  40mg  daily   #HLD -Continue Ezetimibe  10mg  daily -Continue atorvastatin  80mg  daily   #Peripheral Neropathy -continue home gabapentin  300mg  bid  Best Practice: Diet: Regular diet IVF: Fluids: None, Rate: None VTE: DOAC Code: Full  Disposition planning: DISPO: Anticipated discharge in 1-2 days to Home pending clinical improvement.  Signature:  Letha Cheadle, MD [facility]  PGY-1 02/11/2024, 11:54 AM  On Call pager [phone]

## 2024-02-11 NOTE — Progress Notes (Signed)
 Without the use of the ventilator the patient's condition will quickly deteriorate. Removal of the ventilator may cause serious harm to the patient, exacerbation of condition and hospital readmission. Bilevel/RAD has been tried and failed to maintain or stabilize the patient. Bilevel does not meet current volume requirements. Patient requires frequent durations of ventilatory support. Intermittent usage is insufficient.

## 2024-02-12 ENCOUNTER — Other Ambulatory Visit: Payer: Self-pay

## 2024-02-12 ENCOUNTER — Inpatient Hospital Stay (HOSPITAL_COMMUNITY)

## 2024-02-12 ENCOUNTER — Telehealth (HOSPITAL_COMMUNITY): Payer: Self-pay

## 2024-02-12 ENCOUNTER — Ambulatory Visit: Payer: Medicare HMO | Attending: Family Medicine

## 2024-02-12 DIAGNOSIS — J449 Chronic obstructive pulmonary disease, unspecified: Secondary | ICD-10-CM | POA: Diagnosis not present

## 2024-02-12 DIAGNOSIS — I4891 Unspecified atrial fibrillation: Secondary | ICD-10-CM

## 2024-02-12 DIAGNOSIS — J9611 Chronic respiratory failure with hypoxia: Secondary | ICD-10-CM | POA: Diagnosis not present

## 2024-02-12 DIAGNOSIS — J9612 Chronic respiratory failure with hypercapnia: Secondary | ICD-10-CM

## 2024-02-12 DIAGNOSIS — R918 Other nonspecific abnormal finding of lung field: Secondary | ICD-10-CM | POA: Diagnosis not present

## 2024-02-12 DIAGNOSIS — I10 Essential (primary) hypertension: Secondary | ICD-10-CM

## 2024-02-12 LAB — CBC
HCT: 38.9 % — ABNORMAL LOW (ref 39.0–52.0)
Hemoglobin: 10.7 g/dL — ABNORMAL LOW (ref 13.0–17.0)
MCH: 25.8 pg — ABNORMAL LOW (ref 26.0–34.0)
MCHC: 27.5 g/dL — ABNORMAL LOW (ref 30.0–36.0)
MCV: 93.7 fL (ref 80.0–100.0)
Platelets: 211 K/uL (ref 150–400)
RBC: 4.15 MIL/uL — ABNORMAL LOW (ref 4.22–5.81)
RDW: 14.5 % (ref 11.5–15.5)
WBC: 8.2 K/uL (ref 4.0–10.5)
nRBC: 0 % (ref 0.0–0.2)

## 2024-02-12 LAB — BASIC METABOLIC PANEL WITH GFR
Anion gap: 9 (ref 5–15)
BUN: 14 mg/dL (ref 8–23)
CO2: 41 mmol/L — ABNORMAL HIGH (ref 22–32)
Calcium: 8.8 mg/dL — ABNORMAL LOW (ref 8.9–10.3)
Chloride: 94 mmol/L — ABNORMAL LOW (ref 98–111)
Creatinine, Ser: 0.65 mg/dL (ref 0.61–1.24)
GFR, Estimated: >60 mL/min (ref >60)
Glucose, Bld: 128 mg/dL — ABNORMAL HIGH (ref 70–99)
Potassium: 3.8 mmol/L (ref 3.5–5.1)
Sodium: 144 mmol/L (ref 135–145)

## 2024-02-12 LAB — BLOOD GAS, VENOUS
Acid-Base Excess: 23.2 mmol/L — ABNORMAL HIGH (ref 0.0–2.0)
Acid-Base Excess: 18.1 mmol/L — ABNORMAL HIGH (ref 0.0–2.0)
Bicarbonate: 53.2 mmol/L — ABNORMAL HIGH (ref 20.0–28.0)
Bicarbonate: 48 mmol/L — ABNORMAL HIGH (ref 20.0–28.0)
O2 Saturation: 92 %
O2 Saturation: 98 %
Patient temperature: 37
Patient temperature: 37
pCO2, Ven: 84 mmHg (ref 44–60)
pCO2, Ven: 83 mmHg (ref 44–60)
pH, Ven: 7.41 (ref 7.25–7.43)
pH, Ven: 7.37 (ref 7.25–7.43)
pO2, Ven: 60 mmHg — ABNORMAL HIGH (ref 32–45)
pO2, Ven: 82 mmHg — ABNORMAL HIGH (ref 32–45)

## 2024-02-12 MED ORDER — IPRATROPIUM-ALBUTEROL 0.5-2.5 (3) MG/3ML IN SOLN
3.0000 mL | Freq: Four times a day (QID) | RESPIRATORY_TRACT | Status: DC
  Administered 2024-02-12 – 2024-02-13 (×4): 3 mL via RESPIRATORY_TRACT
  Filled 2024-02-12 (×3): qty 3

## 2024-02-12 MED ORDER — ARFORMOTEROL TARTRATE 15 MCG/2ML IN NEBU: 15.0000 ug | INHALATION_SOLUTION | Freq: Two times a day (BID) | RESPIRATORY_TRACT | Status: DC

## 2024-02-12 MED ORDER — BISOPROLOL FUMARATE 5 MG PO TABS
5.0000 mg | ORAL_TABLET | Freq: Every day | ORAL | Status: DC
  Administered 2024-02-12 – 2024-02-13 (×2): 5 mg via ORAL
  Filled 2024-02-12 (×2): qty 1

## 2024-02-12 MED ORDER — ACETAMINOPHEN 500 MG PO TABS
1000.0000 mg | ORAL_TABLET | Freq: Four times a day (QID) | ORAL | Status: DC | PRN
  Administered 2024-02-12 (×2): 1000 mg via ORAL
  Filled 2024-02-12 (×2): qty 2

## 2024-02-12 MED ORDER — INFLUENZA VAC SPLIT HIGH-DOSE 0.5 ML IM SUSY
0.5000 mL | PREFILLED_SYRINGE | INTRAMUSCULAR | Status: AC
  Administered 2024-02-13: 0.5 mL via INTRAMUSCULAR
  Filled 2024-02-12: qty 0.5

## 2024-02-12 MED ORDER — POTASSIUM CHLORIDE CRYS ER 20 MEQ PO TBCR
40.0000 meq | EXTENDED_RELEASE_TABLET | Freq: Once | ORAL | Status: AC
  Administered 2024-02-12: 40 meq via ORAL
  Filled 2024-02-12: qty 2

## 2024-02-12 MED ORDER — BUDESONIDE 0.5 MG/2ML IN SUSP
0.5000 mg | Freq: Two times a day (BID) | RESPIRATORY_TRACT | Status: DC
  Administered 2024-02-12 – 2024-02-13 (×2): 0.5 mg via RESPIRATORY_TRACT
  Filled 2024-02-12 (×3): qty 2

## 2024-02-12 MED ORDER — GUAIFENESIN ER 600 MG PO TB12
600.0000 mg | ORAL_TABLET | Freq: Two times a day (BID) | ORAL | Status: DC | PRN
  Administered 2024-02-12 (×2): 600 mg via ORAL
  Filled 2024-02-12 (×2): qty 1

## 2024-02-12 NOTE — TOC Progression Note (Addendum)
 Transition of Care [facility]) - Progression Note    Patient Details  Name: Jared [name] MRN: [id number] Date of Birth: [birth year]  [facility] CM/SW Contact  Waddell Barnie Rama, RN Phone Number: 02/12/2024, 11:31 AM  Clinical Narrative:    Per Thomasina with [employer], the NIV has been approved,  will be supplying this for patient prior to dc in the patient room, will need a another family member (sister) there as well for the education prior to dc.   Expected Discharge Plan: Home/Self Care Barriers to Discharge: Continued Medical Work up               Expected Discharge Plan and Services In-house Referral: NA Discharge Planning Services: CM Consult Post Acute Care Choice: NA Living arrangements for the past 2 months: Boarding House                 DME Arranged: N/A DME Agency: NA       HH Arranged: NA           Social Drivers of Health (SDOH) Interventions SDOH Screenings   Food Insecurity: No Food Insecurity (02/09/2024)  Housing: Low Risk  (02/09/2024)  Transportation Needs: No Transportation Needs (02/09/2024)  Utilities: Not At Risk (02/09/2024)  Alcohol Screen: Low Risk  (06/10/2023)  Depression (PHQ2-9): Low Risk  (02/07/2024)  Financial Resource Strain: Low Risk  (06/10/2023)  Physical Activity: Insufficiently Active (06/10/2023)  Social Connections: Moderately Isolated (02/09/2024)  Stress: No Stress Concern Present (06/10/2023)  Tobacco Use: High Risk (02/09/2024)  Health Literacy: Adequate Health Literacy (06/10/2023)    Readmission Risk Interventions    02/11/2024    3:54 PM 12/27/2022    2:20 PM 12/14/2021    1:38 PM  Readmission Risk Prevention Plan  Post Dischage Appt  Complete Complete  Medication Screening  Complete Complete  Transportation Screening Complete Complete Complete  PCP or Specialist Appt within 3-5 Days Complete    HRI or Home Care Consult Complete    Palliative Care Screening Not Applicable    Medication Review (RN Care  Manager) Complete

## 2024-02-12 NOTE — Progress Notes (Addendum)
 HD#3 SUBJECTIVE:  Patient Summary: Jared [name] is a 68 y.o. with a pertinent PMH of COPD with baseline 3LNC, HFrEF, PAF on Xarelto , T2DM, CAD, IDA, TUD who presented with shortness of breath and admitted for COPD exacerbation.   Overnight Events: This morning patient going in and out of SVT rates up to 160s without CP. Still feeling SOB on home 3L [place] while lying in bed. Also c/o some abdominal tightness. Still having a headache. Reports feeling less short of breath after receiving morning meds.  Interim History:  Jared [name] is feeling much better and not short of breath while sitting in his recliner. He states that he just went walking with the PT and didn't feel short of breath at that time. He received his nebulizer treatments this morning. Still complaining of headache that is midline 8/10 that feels dull. Denies CP, SOB, vision changes.   OBJECTIVE:  Vital Signs: Vitals:   02/12/24 0418 02/12/24 0700 02/12/24 1112 02/12/24 1113  BP:  (!) 153/101 (!) 145/92 (!) 145/92  Pulse:   81 77  Resp:   18 18  Temp:   98.8 F (37.1 C) 98.8 F (37.1 C)  TempSrc:   Oral   SpO2:   99% 99%  Weight: 92.2 kg     Height:        Filed Weights   02/10/24 0433 02/11/24 0500 02/12/24 0418  Weight: 90.7 kg 91.4 kg 92.2 kg     Intake/Output Summary (Last 24 hours) at 02/12/2024 1413 Last data filed at 02/12/2024 0900 Gross per 24 hour  Intake 840 ml  Output 150 ml  Net 690 ml   Net IO Since Admission: 1,060 mL [02/12/24 1413]  Physical Exam: Constitutional: well-appearing, well-nourished but in no acute distress HENT: normocephalic atraumatic, mucous membranes moist; wearing nasal cannula Eyes: conjunctiva non-erythematous, PERRL, no scleral icterus Cardiovascular: regular rate and rhythm, no m/r/g Pulmonary/Chest: normal work of breathing on 4L [place]; no wheezing heard upon auscultation, improved from yesterday Abdominal: soft, non-tender, non-distended, bowel sounds normal MSK: normal bulk  and tone Neurological: alert & oriented x3 Skin: warm and dry Extremities: no edema or cyanosis; peripheral pulses intact Psych: normal mood and affect, thought content normal  Patient Lines/Drains/Airways Status     Active Line/Drains/Airways     Name Placement date Placement time Site Days   Peripheral IV 02/09/24 18 G Left;Posterior Forearm 02/09/24  0603  Forearm  1            Pertinent labs and imaging:     Latest Ref Rng & Units 02/12/2024    2:17 AM 02/11/2024    2:59 AM 02/10/2024    2:47 AM  CBC  WBC 4.0 - 10.5 K/uL 8.2  10.9  5.8   Hemoglobin 13.0 - 17.0 g/dL 89.2  89.2  88.6   Hematocrit 39.0 - 52.0 % 38.9  39.2  42.0   Platelets 150 - 400 K/uL 211  212  203        Latest Ref Rng & Units 02/12/2024    2:17 AM 02/11/2024    2:59 AM 02/10/2024    2:47 AM  CMP  Glucose 70 - 99 mg/dL 871  866  824   BUN 8 - 23 mg/dL 14  14  15    Creatinine 0.61 - 1.24 mg/dL 9.34  9.23  9.16   Sodium 135 - 145 mmol/L 144  144  143   Potassium 3.5 - 5.1 mmol/L 3.8  3.7  3.7  Chloride 98 - 111 mmol/L 94  94  94   CO2 22 - 32 mmol/L 41  38  41   Calcium  8.9 - 10.3 mg/dL 8.8  8.8  9.2     DG CHEST PORT 1 VIEW Result Date: 02/12/2024 CLINICAL DATA:  Dyspnea. COPD with acute exacerbation. History of heart failure with recent drop in ejection fraction, but appears euvolemic on exam. EXAM: PORTABLE CHEST 1 VIEW COMPARISON:  Radiograph 02/09/2024, CT 02/04/2024 FINDINGS: The heart is enlarged, unchanged. Stable mediastinal contours. Aortic atherosclerosis. Bronchial thickening. Diminished interstitial prominence. No confluent airspace disease, significant pleural effusion, or pneumothorax. Remote left rib fractures. IMPRESSION: 1. Chronic cardiomegaly. 2. Bronchial thickening favors bronchial inflammation over pulmonary edema. Electronically Signed   By: Andrea Gasman M.D.   On: 02/12/2024 10:47     ASSESSMENT/PLAN:  Assessment: Principal Problem:   COPD with acute exacerbation  (HCC) Active Problems:   CAD (coronary artery disease)   Cigarette smoker   Type 2 diabetes mellitus (HCC)   Chronic respiratory failure with hypoxia (HCC)   Iron  deficiency anemia due to chronic blood loss   Peripheral vascular disease (HCC)   PAF (paroxysmal atrial fibrillation) (HCC)   Heart failure with improved ejection fraction (HFimpEF) (HCC)   Hypercarbia   SVT (supraventricular tachycardia) (HCC)   Plan: #Acute hypercarbic respiratory failure w/ chronic hypoxic respiratory failure #COPD Exacerbation #Compensated Respiratory Acidosis Today before morning meds/nebulizer treatments patient reporting shortness of breath while lying in hospital bed. He has been consistently using his BiPAP. VBG with pCO2 coming down to the 80s, appropriate compensation with elevated bicarb.  ABG yesterday showing PCO2 of 76 with bicarb of 43.4.  Exam includes resolution of wheezing in all lung fields.  Mentation remains normal with no signs of encephalopathy.  Repeat CXR showing chronic cardiomegaly with bronchial thickening favoring bronchial inflammation.  Acute hypercarbic respiratory failure in the setting of COPD exacerbation likely due to persistent smoking.  The patient has remained afebrile and has not had increased cough or sputum production from baseline. Will continue nebulizers, BiPAP, and antibiotics.  Consider ambulatory oxygen  saturation monitoring with PT later this afternoon if breathing improves.  Pulmonology consulted for increasing CO2 on VBG. -Continue IV ceftriaxone  1g daily (i9/1/25 - ) -Prednisone  40 mg starting 02/10/2024 for total 5 days of steroids (received methylpred in the ED) -Continue budesonide  and glycopyrrolate  nebulizers -Discontinue arfomoterol nebulizer -DuoNeb every 4 hours -Continue bipap will trial weaning to home 3L [place]  Patient's chronic respiratory failure due to COPD is life threatening.  Previous ABG's have documented high PCO2 and spirometry reveals severe  obstructive ventilatory defect.  Patient would benefit from non-invasive ventilation.  Without this therapy, the patient is at high risk of ending up with worsening symptoms, worsened respiratory failure, need for ER visits and/or recurrent hospitalizations.  Bilevel device unable to adequately support patient's nocturnal ventilation needs.  Patient would benefit from BiPAP therapy with set tidal volumes and pressure. Patient continues to exhibit signs of hypercapnia associated with chronic respiratory failures secondary to severe COPD. Patient requires the use of NIV both QHS and daytime to help with exacerbation periods. The use of the NIV will treat patient's high PCO2 levels and can reduce risk of exacerbations and future hospitalizations when used at nigh and during the day. Patient will need this advanced settings in conjunction with her current medication regimen.  #Supraventricular Tachycardia #PAF Going in and out of SVT with rates up to 160s this morning. Denies chest pain and unable to  tell heart rate is increasing. Could be multifocal atrial tachycardia in the setting of longstanding COPD or due to use of beta agonist nebulizer treatments. Cardiology consulted for assistance. -Cardiology consulted, appreciate recs -Discontinue arfomoterol nebulizer treatments -Continue cardiac telemetry -Continue Xarelto  20 mg  #HFrEF Patient presented with acute on chronic SOB. Appears euvolemic; no crackles or leg swelling on exam. However, BNP is elevated to 462 and he has lost 3kg from admission. Updated TTE showing LVEF down to 45% w/ global hypokinesis, LVH, and grade II diastolic dysfunction. RV w/ normal systolic function, dilated RA/LA. We will continue home medications. -repeat CXR pending -Daily standing weights, strict I/O's -Continue home metoprolol  succinate 50 mg daily -Continue home Lasix  40 mg twice daily  #CAD #Chest tightness, resolved Patient has history of CAD on cath in 2012, now  presenting with chest tightness that comes and goes. Localizes to the right side of his chest. Initial troponin elevated to 52->64->38. EKG without signs of ST elevations. Chest tightness likely related to COPD exacerbation. Given low delta increase in troponins, suspect this may be in the setting of demand ischemia. Less likely due to ACS. -Nitroglycerin  sublingual 0.4mg  prn -Continue atorvastatin  80 mg daily -Continue aspirin  81 mg daily   #Hypertension Blood pressures have been elevated upon admission up to 160s/110s. Will restart on home medications given hypertension. -Continue home hydralazine  25 mg 3 times daily -Continue home metoprolol  succinate 50 mg daily -Continue Imdur  30mg  daily  #Headache No reported history of migraine headaches. Generalized and unable to localize. Likely tension headache, however other differentials include hypercapnic respiratory failure, or due to hypertension. -Acetaminophen  1000 mg every 6 hours as needed for pain  #Physiologic Tremor Patient is reporting shaking when he is in the hospital for several months now.  On exam there is minimal, if any, physiologic tremor present with outstretched arms bilaterally.  He is currently receiving inhaled LABA arformoterol  nebulizer which has a known side effect of increased tremor, nervousness, anxiety.  Enhanced physiologic tremor seems to be the most likely explanation for patient's symptoms as he only endorses these in the hospital where he receives medications that enhance tremor, as well as increased stress from illness/hospitalization. -Continue to monitor  #Iron  Deficiency Anemia #Small Bowel Angiectasias Appears stable, no signs or symptoms of bleeding. Hemoglobin on admission of 12.0, now down to 11.3. Has history of anemia due to chronic blood loss. GI performed EGD which showed esophagitis. Enteroscopy showed small bowel angioectasias which were treated with APC. He is also on bevacizumab  infusions. He  follows with hematology and is on frequent IV iron  supplementation, last on 01/23/24. -trend CBCs   #Prolonged Qtc on EKG Patient was previously in SVT on EKG, but back in NSR. Both EKGs obtained in the ED showing prolonged QT.  Repeat EKG on 02/10/2024 showing QTc of . -Avoid QT-prolonging medications   #PVD History of peripheral vascular disease in the left lower extremity. Vascular surgery performed catheterization on 08/02/2023 which showed atherosclerosis and recommended aspirin , Plavix , Eliquis, and statin with 4-week follow-up for ABI and duplex. The patient had symptomatic anemia with Plavix , so this was discontinued. Appropriate to continue home medications. Ensure proper outpatient follow-up with vascular surgery. -Continue atorvastatin  80 mg -Continue aspirin  81 mg  #T2DM Last A1c of 5.4% on 06/10/23. Appears well-controlled. Will consider sliding scale insulin  if glucoses continue to rise. -Continue to monitor   #TUD Patient reports smoking despite oxygen  use with history of COPD. Consider nicotine  patch if patient would like it.   #  GERD #Esophagitis -continue pantoprazole  40mg  daily   #HLD -Continue Ezetimibe  10mg  daily -Continue atorvastatin  80mg  daily   #Peripheral Neropathy -continue home gabapentin  300mg  bid  Best Practice: Diet: Regular diet IVF: Fluids: None, Rate: None VTE: DOAC Code: Full  Disposition planning: DISPO: Anticipated discharge in 1-2 days to Home pending clinical improvement.  Signature:  Letha Cheadle, MD [facility]  PGY-1 02/12/2024, 2:13 PM  On Call pager [phone]

## 2024-02-12 NOTE — Telephone Encounter (Signed)
 Auth Submission: NO AUTH NEEDED Site of care: Site of care: [facility] Payer: [employer] Medicare, [place] Medicaid Medication & CPT/J Code(s) submitted: Venofer  (Iron  Sucrose) J1756 Diagnosis Code:  Route of submission (phone, fax, portal):  Phone # Fax # Auth type: Buy/Bill HB Units/visits requested: 300mg  x 2 doses Reference number:  Approval from: 02/12/24 to 05/13/24

## 2024-02-12 NOTE — Consult Note (Addendum)
 Cardiology Consultation   Patient ID: Jared [name] MRN: [id number]; DOB: Jun 26, 1955  Admit date: 02/09/2024 Date of Consult: 02/12/2024  PCP:  Delbert Clam, MD   [facility] Providers Cardiologist:  Vina Gull, MD        Patient Profile: Jared [name] is a 68 y.o. male with a hx of CAD, HFimpEF, Hypertension, Hyperlipidemia, Paroxysmal atrial fibrillation on Xarelto , prior CVA, tobacco abuse, COPD on home oxygen  who is being seen 02/12/2024 for the evaluation of SVT at the request of Reyes Fenton MD.  History of Present Illness: Jared [name] is a 68 year old male with prior cardiac history listed below.  Cardiac catheterization in 2012 showed mild CAD.  Echocardiogram in 2018 showed a reduced LVEF of 30 to 35% and diffuse hypokinesis.  Repeat echocardiogram on 04/2018 showed an LVEF of 45 to 50%.  Echocardiogram on 04/2023 showed improved LVEF of 55 to 60%, G2 DD, moderately reduced RV systolic function, and grossly normal valve function.   On 11/2022 patient had a PTCA and stent placement to the left femoral popliteal artery.  Several weeks after having this procedure performed the patient developed anemia and ended up getting an EGD performed.  No active bleeding was seen on the EGD.  On 07/2023 was hospitalized with critical limb ischemia.  Lower extremity angiography showed stent malfunction in the femoral artery due to the previous stent prolapsing and causing stenosis.  This was treated by placing a second stent.  Patient was discharged on triple therapy.    On 08/2023 patient was readmitted with symptomatic anemia and a hemoglobin of 6.1.  During this hospitalization the patient received 3 units of RBCs.  Endoscopy found 3 nonbleeding angioectasias that were treated with argon plasma coagulation (APC).  Patient was discharged home on aspirin  and Xarelto .  On 11/13/2023 the patient was rehospitalized with symptomatic anemia and was found to have an hemoglobin of 6.1.  Patient  underwent a blood transfusion in the emergency department.  He was discharged and has received periodic blood transfusions in the outpatient setting.  An EGD was done on 11/19/2023 that showed a short segment of Barrett's esophagus and 2 nonbleeding duodenal angioectasias.  These were treated with APC.  On 12/09/2023 the patient received 2 more units of blood.   Patient was seen by Hao Meng PA-C on 12/11/23 at that time he was hypotensive and his hydralazine  was decreased.  Prior to admission patient was on aspirin  81 mg daily, atorvastatin  80 mg daily, Zetia  10 mg daily, Lasix  40 mg twice daily, Imdur  30 mg daily, metoprolol  XL 50 mg daily, and Xarelto  20 mg daily.  Patient presented to the emergency department on 02/09/2024 for worsening shortness of breath.  Patient has been treated for a COPD exacerbation.   On interview patient reported that he came to the emergency department because of his worsening shortness of breath.  Denied having any chest pain, lower extremity edema, fever, chills, diaphoresis, nausea, or vomiting.  Feels like his shortness of breath is improved slightly since being hospitalized.  Reported that he sometimes feels symptoms from the SVT.   Labs showed potassium of 3.8, decreased chloride of 94, hypocalcemia of 8.8, hyperglycemia  of 128, and anemia with a hemoglobin of 10.7.  Respiratory panel was negative. ABG showed increased pressure pressure of carbon dioxide, increased partial pressure of oxygen , and increased bicarb  EKG showed normal sinus rhythm with a rate of 91, a PVC, and a left anterior fascicular block.  Chest x-ray showed chronic cardiomegaly, bronchial  thickening favors bronchial inflammation over pulmonary edema.  Echocardiogram (02/10/2024) showed a mildly reduced LVEF of 45%, global hypokinesis, mild eccentric LVH, G2 DD, elevated left atrial pressure, moderately dilated left and right atrium, small pericardial effusion, aortic sclerosis with no evidence of  stenosis, mild dilation of the ascending aorta measuring 44 mm, and elevated right atrial pressure measuring 15 mmHg.  Past Medical History:  Diagnosis Date   Acute blood loss anemia 05/18/2023   Acute perforated appendicitis 04/30/2023   AKI (acute kidney injury) (HCC) 11/18/2020   Atrial fibrillation with RVR (HCC) 05/06/2023   AVM (arteriovenous malformation)    CAD (coronary artery disease)    a. LHC 5/12:  LAD 20, pLCx 20, pRCA 40, dRCA 40, EF 35%, diff HK  //  b. Myoview 4/16: Overall Impression:  High risk stress nuclear study There is no evidence of ischemia.  There is severe LV dysfunction. LV Ejection Fraction: 30%.  LV Wall Motion:  There is global LV hypokinesis.     CAP (community acquired pneumonia) 09/2013   Chronic combined systolic and diastolic CHF (congestive heart failure) (HCC)    a. Echo 4/16:Mild LVH, EF 40-45%, diffuse HK //  b. Echo 8/17: EF 35-40%, diffuse HK, diastolic dysfunction, aortic sclerosis, trivial MR, moderate LAE, normal RVSF, moderate RAE, mild TR, PASP 42 mmHg // c. Echo 4/18: Mild concentric LVH, EF 30-35, normal wall motion, grade 1 diastolic dysfunction, PASP 49   Chronic respiratory failure (HCC)    Cluster headache    hx; haven't had one in awhile (01/09/2016)   COPD (chronic obstructive pulmonary disease) (HCC)    thelbert 01/09/2016   DM (diabetes mellitus) (HCC)    History of CVA (cerebrovascular accident)    Hypertension    IDA (iron  deficiency anemia)    Moderate tobacco use disorder    NICM (nonischemic cardiomyopathy) (HCC)    Nicotine  addiction    Peripheral vascular disease (HCC)    Syncope 06/11/2019   Tobacco abuse    Type 2 diabetes mellitus (HCC) 05/14/2016    Past Surgical History:  Procedure Laterality Date   ABDOMINAL AORTOGRAM W/LOWER EXTREMITY N/A 12/07/2022   Procedure: ABDOMINAL AORTOGRAM W/LOWER EXTREMITY;  Surgeon: Magda Debby SAILOR, MD;  [facility]: [facility];  Service: Cardiovascular;  Laterality: N/A;    ABDOMINAL AORTOGRAM W/LOWER EXTREMITY Left 08/02/2023   Procedure: ABDOMINAL AORTOGRAM W/LOWER EXTREMITY;  Surgeon: Magda Debby SAILOR, MD;  Location: [facility];  Service: Cardiovascular;  Laterality: Left;   BALLOON ENTEROSCOPY N/A 11/19/2023   Procedure: ENTEROSCOPY, USING BALLOON;  Surgeon: San Sandor GAILS, DO;  Location: [facility];  Service: Gastroenterology;  Laterality: N/A;  single balloon enteroscopy   BIOPSY  11/12/2020   Procedure: BIOPSY;  Surgeon: Wilhelmenia Aloha Raddle., MD;  Location: [facility] ENDOSCOPY;  Service: Gastroenterology;;   CARDIAC CATHETERIZATION  10/2010   LM normal, LAD with 20% irregularities, LCX with 20%, RCA with 40% prox and 40% distal - EF of 35%   CATARACT EXTRACTION, BILATERAL     COLONOSCOPY W/ BIOPSIES AND POLYPECTOMY     COLONOSCOPY WITH PROPOFOL  N/A 09/06/2018   Procedure: COLONOSCOPY WITH PROPOFOL ;  Surgeon: Eda Iha, MD;  Location: [facility] ENDOSCOPY;  Service: Gastroenterology;  Laterality: N/A;   ENTEROSCOPY N/A 09/28/2018   Procedure: ENTEROSCOPY;  Surgeon: Rollin Dover, MD;  Location: [facility] ENDOSCOPY;  Service: Endoscopy;  Laterality: N/A;   ENTEROSCOPY N/A 10/28/2018   Procedure: ENTEROSCOPY;  Surgeon: Eda Iha, MD;  Location: [facility] ENDOSCOPY;  Service: Gastroenterology;  Laterality: N/A;  ENTEROSCOPY N/A 10/09/2020   Procedure: ENTEROSCOPY;  Surgeon: Abran Norleen SAILOR, MD;  Location: [facility] ENDOSCOPY;  Service: Endoscopy;  Laterality: N/A;   ENTEROSCOPY N/A 03/22/2022   Procedure: ENTEROSCOPY;  Surgeon: Leigh Elspeth SQUIBB, MD;  Location: [facility] ENDOSCOPY;  Service: Gastroenterology;  Laterality: N/A;   ENTEROSCOPY N/A 05/20/2023   Procedure: ENTEROSCOPY;  Surgeon: Legrand Victory LITTIE DOUGLAS, MD;  Location: [facility] ENDOSCOPY;  Service: Gastroenterology;  Laterality: N/A;   ENTEROSCOPY N/A 08/24/2023   Procedure: ENTEROSCOPY;  Surgeon: Suzann [name] HERO, MD;  Location: [facility] ENDOSCOPY;  Service: Gastroenterology;  Laterality: N/A;   ESOPHAGOGASTRODUODENOSCOPY N/A  11/12/2020   Procedure: ESOPHAGOGASTRODUODENOSCOPY (EGD);  Surgeon: Wilhelmenia Aloha Raddle., MD;  Location: [facility];  Service: Gastroenterology;  Laterality: N/A;   ESOPHAGOGASTRODUODENOSCOPY (EGD) WITH PROPOFOL  N/A 09/05/2018   Procedure: ESOPHAGOGASTRODUODENOSCOPY (EGD) WITH PROPOFOL ;  Surgeon: Leigh Elspeth SQUIBB, MD;  Location: [facility] ENDOSCOPY;  Service: Gastroenterology;  Laterality: N/A;   ESOPHAGOGASTRODUODENOSCOPY (EGD) WITH PROPOFOL  N/A 11/19/2020   Procedure: ESOPHAGOGASTRODUODENOSCOPY (EGD) WITH PROPOFOL ;  Surgeon: Albertus Gordy HERO, MD;  Location: [facility] ENDOSCOPY;  Service: Gastroenterology;  Laterality: N/A;   ESOPHAGOGASTRODUODENOSCOPY (EGD) WITH PROPOFOL  N/A 12/27/2022   Procedure: ESOPHAGOGASTRODUODENOSCOPY (EGD) WITH PROPOFOL ;  Surgeon: Shila Gustav GAILS, MD;  Location: [facility] ENDOSCOPY;  Service: Gastroenterology;  Laterality: N/A;   EXCISION MASS HEAD     GIVENS CAPSULE STUDY N/A 09/06/2018   Procedure: GIVENS CAPSULE STUDY;  Surgeon: Eda Iha, MD;  Location: [facility] ENDOSCOPY;  Service: Gastroenterology;  Laterality: N/A;   GIVENS CAPSULE STUDY N/A 09/26/2018   Procedure: GIVENS CAPSULE STUDY;  Surgeon: Albertus Gordy HERO, MD;  Location: [facility] ENDOSCOPY;  Service: Gastroenterology;  Laterality: N/A;   GIVENS CAPSULE STUDY N/A 06/14/2019   Procedure: GIVENS CAPSULE STUDY;  Surgeon: Shila Gustav GAILS, MD;  Location: [facility];  Service: Endoscopy;  Laterality: N/A;   HEMOSTASIS CLIP PLACEMENT  11/12/2020   Procedure: HEMOSTASIS CLIP PLACEMENT;  Surgeon: Wilhelmenia Aloha Raddle., MD;  Location: [facility];  Service: Gastroenterology;;   HEMOSTASIS CONTROL  11/12/2020   Procedure: HEMOSTASIS CONTROL;  Surgeon: Wilhelmenia Aloha Raddle., MD;  Location: [facility]OSCOPY;  Service: Gastroenterology;;   HOT HEMOSTASIS N/A 10/28/2018   Procedure: HOT HEMOSTASIS (ARGON PLASMA COAGULATION/BICAP);  Surgeon: Eda Iha, MD;  Location: [facility] ENDOSCOPY;  Service: Gastroenterology;  Laterality: N/A;   HOT  HEMOSTASIS N/A 11/12/2020   Procedure: HOT HEMOSTASIS (ARGON PLASMA COAGULATION/BICAP);  Surgeon: Wilhelmenia Aloha Raddle., MD;  Location: [facility];  Service: Gastroenterology;  Laterality: N/A;   HOT HEMOSTASIS N/A 03/22/2022   Procedure: HOT HEMOSTASIS (ARGON PLASMA COAGULATION/BICAP);  Surgeon: Leigh Elspeth SQUIBB, MD;  Location: [facility] ENDOSCOPY;  Service: Gastroenterology;  Laterality: N/A;   HOT HEMOSTASIS N/A 05/20/2023   Procedure: HOT HEMOSTASIS (ARGON PLASMA COAGULATION/BICAP);  Surgeon: Legrand Victory LITTIE DOUGLAS, MD;  Location: [facility] ENDOSCOPY;  Service: Gastroenterology;  Laterality: N/A;   HOT HEMOSTASIS N/A 08/24/2023   Procedure: EGD, WITH ARGON PLASMA COAGULATION;  Surgeon: Suzann [name] HERO, MD;  Location: [facility] ENDOSCOPY;  Service: Gastroenterology;  Laterality: N/A;   INCISION AND DRAINAGE PERIRECTAL ABSCESS N/A 06/05/2017   Procedure: IRRIGATION AND DEBRIDEMENT PERIRECTAL ABSCESS;  Surgeon: Teresa Lonni HERO, MD;  Location: [facility];  Service: General;  Laterality: N/A;   IR CATHETER TUBE CHANGE  05/14/2023   LAPAROSCOPIC APPENDECTOMY N/A 05/01/2023   Procedure: APPENDECTOMY LAPAROSCOPIC;  Surgeon: Ebbie Cough, MD;  Location: [facility] OR;  Service: General;  Laterality: N/A;   PERIPHERAL VASCULAR BALLOON ANGIOPLASTY Left 08/02/2023   Procedure: PERIPHERAL VASCULAR BALLOON ANGIOPLASTY;  Surgeon: Magda Debby SAILOR,  MD;  [facility];  Service: Cardiovascular;  Laterality: Left;   PERIPHERAL VASCULAR INTERVENTION  12/07/2022   Procedure: PERIPHERAL VASCULAR INTERVENTION;  Surgeon: Magda Debby SAILOR, MD;  [facility];  Service: Cardiovascular;;   PERIPHERAL VASCULAR INTERVENTION Left 08/02/2023   Procedure: PERIPHERAL VASCULAR INTERVENTION;  Surgeon: Magda Debby SAILOR, MD;  [facility];  Service: Cardiovascular;  Laterality: Left;   SUBMUCOSAL TATTOO INJECTION  11/12/2020   Procedure: SUBMUCOSAL TATTOO INJECTION;  Surgeon: Wilhelmenia Aloha Raddle., MD;   Location: [facility];  Service: Gastroenterology;;   SUBMUCOSAL TATTOO INJECTION  05/20/2023   Procedure: SUBMUCOSAL TATTOO INJECTION;  Surgeon: Legrand Victory LITTIE DOUGLAS, MD;  Location: [facility] ENDOSCOPY;  Service: Gastroenterology;;   VIDEO BRONCHOSCOPY Bilateral 05/08/2016   Procedure: VIDEO BRONCHOSCOPY WITH FLUORO;  Surgeon: Harden LULLA Staff, MD;  Location: [facility] ENDOSCOPY;  Service: Cardiopulmonary;  Laterality: Bilateral;     Home Medications:  Prior to Admission medications   Medication Sig Start Date End Date Taking? Authorizing Provider  albuterol  (PROVENTIL ) (2.5 MG/3ML) 0.083% nebulizer solution Take 3 mLs (2.5 mg total) by nebulization every 2 (two) hours as needed for wheezing or shortness of breath. 10/17/23  Yes Darlean Ozell NOVAK, MD  albuterol  (VENTOLIN  HFA) 108 (90 Base) MCG/ACT inhaler INHALE 2 PUFFS INTO THE LUNGS EVERY 6 (SIX) HOURS AS NEEDED FOR WHEEZING OR SHORTNESS OF BREATH. 09/09/23  Yes Newlin, Enobong, MD  aspirin  EC 81 MG tablet Take 81 mg by mouth daily. Swallow whole.   Yes [provider]  atorvastatin  (LIPITOR ) 80 MG tablet Take 1 tablet (80 mg total) by mouth daily. 09/09/23  Yes Newlin, Enobong, MD  budesonide -glycopyrrolate -formoterol  (BREZTRI  AEROSPHERE) 160-9-4.8 MCG/ACT AERO inhaler Take 2 puffs first thing in morning and then another 2 puffs about 12 hours later. 08/15/23  Yes Darlean Ozell NOVAK, MD  DULoxetine  (CYMBALTA ) 60 MG capsule Take 1 capsule (60 mg total) by mouth daily. For chronic leg pains 09/09/23  Yes Newlin, Corrina, MD  ergocalciferol  (DRISDOL ) 1.25 MG (50000 UT) capsule Take 1 capsule (50,000 Units total) by mouth once a week. 11/13/23  Yes Newlin, Enobong, MD  ezetimibe  (ZETIA ) 10 MG tablet Take 1 tablet (10 mg total) by mouth daily. 06/06/23  Yes Okey Vina LULLA, MD  ferrous sulfate  (FEROSUL) 325 (65 FE) MG tablet Take 1 tablet (325 mg total) by mouth 2 (two) times daily with a meal. 09/09/23  Yes Newlin, Enobong, MD  folic acid  (FOLVITE ) 1 MG tablet Take 1 tablet  (1 mg total) by mouth daily. 09/09/23  Yes Newlin, Enobong, MD  furosemide  (LASIX ) 40 MG tablet Take 1 tablet (40 mg total) by mouth 2 (two) times daily. 09/09/23  Yes Newlin, Enobong, MD  gabapentin  (NEURONTIN ) 300 MG capsule Take 1 capsule (300 mg total) by mouth at bedtime. For leg pains 11/20/23  Yes Newlin, Enobong, MD  hydrALAZINE  (APRESOLINE ) 25 MG tablet Take 1 tablet (25 mg total) by mouth every 8 (eight) hours. Patient taking differently: Take 25 mg by mouth in the morning and at bedtime. 12/11/23  Yes Meng, Hao, PA  isosorbide  mononitrate (IMDUR ) 30 MG 24 hr tablet Take 1 tablet (30 mg total) by mouth daily. 11/20/23  Yes Newlin, Enobong, MD  metoprolol  succinate (TOPROL -XL) 50 MG 24 hr tablet Take 1 tablet (50 mg total) by mouth daily. 09/09/23  Yes Newlin, Enobong, MD  nitroGLYCERIN  (NITROSTAT ) 0.4 MG SL tablet Place 0.4 mg under the tongue every 5 (five) minutes as needed for chest pain.  Yes [provider]  pantoprazole  (PROTONIX ) 40 MG tablet Take 1 tablet (40 mg total) by mouth daily. 09/09/23  Yes Newlin, Enobong, MD  rivaroxaban  (XARELTO ) 20 MG TABS tablet Take 1 tablet (20 mg total) by mouth daily with supper. 09/09/23  Yes Newlin, Enobong, MD  OXYGEN  Inhale 2 L/min into the lungs continuous. Patient taking differently: Inhale 3 L/min into the lungs continuous.    [provider]  TUDORZA PRESSAIR  400 MCG/ACT AEPB INHALE 1 PUFF INTO THE LUNGS IN THE MORNING AND AT BEDTIME. 03/29/20 04/04/20  Darlean Ozell NOVAK, MD    Scheduled Meds:  aspirin  EC  81 mg Oral Daily   atorvastatin   80 mg Oral Daily   budesonide  (PULMICORT ) nebulizer solution  0.25 mg Nebulization BID   ezetimibe   10 mg Oral Daily   ferrous sulfate   325 mg Oral BID WC   furosemide   40 mg Oral BID   gabapentin   300 mg Oral BID   hydrALAZINE   25 mg Oral Q8H   ipratropium-albuterol   3 mL Nebulization BID   isosorbide  mononitrate  30 mg Oral Daily   metoprolol  succinate  50 mg Oral Daily   pantoprazole    40 mg Oral Daily   predniSONE   40 mg Oral Q breakfast   revefenacin   175 mcg Nebulization Daily   rivaroxaban   20 mg Oral Q supper   Continuous Infusions:  cefTRIAXone  (ROCEPHIN )  IV 1 g (02/12/24 1245)   PRN Meds: acetaminophen , guaiFENesin , nitroGLYCERIN   Allergies:    Allergies  Allergen Reactions   Lisinopril  Cough    Social History:   Social History   Socioeconomic History   Marital status: Single    Spouse name: Not on file   Number of children: 1   Years of education: Not on file   Highest education level: 12th grade  Occupational History   Occupation: retired  Tobacco Use   Smoking status: Every Day    Current packs/day: 0.50    Average packs/day: 0.5 packs/day for 47.0 years (23.5 ttl pk-yrs)    Types: Cigarettes   Smokeless tobacco: Never   Tobacco comments:    4/5 cigs  Vaping Use   Vaping status: Never Used  Substance and Sexual Activity   Alcohol use: Not Currently    Alcohol/week: 0.0 standard drinks of alcohol    Comment: last drink was before xmas   Drug use: No    Types: Cocaine, Marijuana    Comment: nothing in 20 years   Sexual activity: Not Currently  Other Topics Concern   Not on file  Social History Narrative   unemployed   Social Drivers of Corporate investment banker Strain: Low Risk  (06/10/2023)   Overall Financial Resource Strain (CARDIA)    Difficulty of Paying Living Expenses: Not hard at all  Food Insecurity: No Food Insecurity (02/09/2024)   Hunger Vital Sign    Worried About Running Out of Food in the Last Year: Never true    Ran Out of Food in the Last Year: Never true  Transportation Needs: No Transportation Needs (02/09/2024)   PRAPARE - Administrator, Civil Service (Medical): No    Lack of Transportation (Non-Medical): No  Physical Activity: Insufficiently Active (06/10/2023)   Exercise Vital Sign    Days of Exercise per Week: 3 days    Minutes of Exercise per Session: 20 min  Stress: No Stress Concern  Present (06/10/2023)   [employer] of Occupational Health - Occupational Stress Questionnaire  Feeling of Stress : Not at all  Social Connections: Moderately Isolated (02/09/2024)   Social Connection and Isolation Panel    Frequency of Communication with Friends and Family: More than three times a week    Frequency of Social Gatherings with Friends and Family: Twice a week    Attends Religious Services: Never    Database administrator or Organizations: Patient declined    Attends Banker Meetings: 1 to 4 times per year    Marital Status: Divorced  Catering manager Violence: Not At Risk (02/09/2024)   Humiliation, Afraid, Rape, and Kick questionnaire    Fear of Current or Ex-Partner: No    Emotionally Abused: No    Physically Abused: No    Sexually Abused: No    Family History:    Family History  Problem Relation Age of Onset   Heart disease Mother    Diabetes Mother    Colon cancer Mother    Liver cancer Mother    Cancer Father        type unknown   Diabetes Sister        x 2   Diabetes Brother      ROS:  Please see the history of present illness.   All other ROS reviewed and negative.     Physical Exam/Data: Vitals:   02/12/24 0418 02/12/24 0700 02/12/24 1112 02/12/24 1113  BP:  (!) 153/101 (!) 145/92 (!) 145/92  Pulse:   81 77  Resp:   18 18  Temp:   98.8 F (37.1 C) 98.8 F (37.1 C)  TempSrc:   Oral   SpO2:   99% 99%  Weight: 92.2 kg     Height:        Intake/Output Summary (Last 24 hours) at 02/12/2024 1302 Last data filed at 02/12/2024 0900 Gross per 24 hour  Intake 840 ml  Output 150 ml  Net 690 ml      02/12/2024    4:18 AM 02/11/2024    5:00 AM 02/10/2024    4:33 AM  Last 3 Weights  Weight (lbs) 203 lb 4.2 oz 201 lb 8 oz 200 lb  Weight (kg) 92.2 kg 91.4 kg 90.719 kg     Body mass index is 26.82 kg/m.  General:  Well nourished, well developed, in no acute distress, on BiPAP with a 40% FiO2 HEENT: normal Neck: Unable to assess  JVD due to body habitus and beard. Vascular: No carotid bruits; Distal pulses 2+ bilaterally Cardiac:  normal S1, S2; RRR; no murmur  Lungs: Bibasilar crackles, diffuse wheezing heard throughout the lungs. Abd: soft, nontender, no hepatomegaly  Ext: no edema Musculoskeletal:  No deformities. Skin: warm and dry  Neuro:   no focal abnormalities noted Psych:  Normal affect   EKG:  The EKG was personally reviewed and demonstrates:   Telemetry:  Telemetry was personally reviewed and demonstrates:  Normal sinus rhythm with heart rates in the 70's to 90's with frequent PVC's. Has multiple runs of SVT with heart rates in the 140's and 170's.   Relevant CV Studies: Echocardiogram on 02/10/24: Mildly reduced LVEF of 45% with global HK.  Mildly dilated LV.  Mild eccentric LVH.  GR 2 DD.  Elevated LAP.  Unable to assess PAP but RV appears mildly dilated.  Both atria are mildly dilated.  Small pericardial effusion noted.  Relatively normal mitral valve.  Aortic valve calcification with sclerosis but no stenosis.  IVC dilated estimating RAP 8 to 15 mmHg.  --------------------------------------------------------------------------------------------------------------  Laboratory Data: High Sensitivity Troponin:   Recent Labs  Lab 02/09/24 0637 02/09/24 0803 02/09/24 1522 02/09/24 1731  TROPONINIHS 52* 64* 38* 42*     Chemistry Recent Labs  Lab 02/10/24 0247 02/11/24 0259 02/12/24 0217  NA 143 144 144  K 3.7 3.7 3.8  CL 94* 94* 94*  CO2 41* 38* 41*  GLUCOSE 175* 133* 128*  BUN 15 14 14   CREATININE 0.83 0.76 0.65  CALCIUM  9.2 8.8* 8.8*  MG 2.1  --   --   GFRNONAA >60 >60 >60  ANIONGAP 8 12 9     No results for input(s): PROT, ALBUMIN , AST, ALT, ALKPHOS, BILITOT in the last 168 hours. Lipids No results for input(s): CHOL, TRIG, HDL, LABVLDL, LDLCALC, CHOLHDL in the last 168 hours.  Hematology Recent Labs  Lab 02/10/24 0247 02/11/24 0259 02/12/24 0217  WBC 5.8  10.9* 8.2  RBC 4.47 4.17* 4.15*  HGB 11.3* 10.7* 10.7*  HCT 42.0 39.2 38.9*  MCV 94.0 94.0 93.7  MCH 25.3* 25.7* 25.8*  MCHC 26.9* 27.3* 27.5*  RDW 14.2 14.6 14.5  PLT 203 212 211   Thyroid  No results for input(s): TSH, FREET4 in the last 168 hours.  BNP Recent Labs  Lab 02/09/24 0638  BNP 462.2*    DDimer No results for input(s): DDIMER in the last 168 hours.  Radiology/Studies:  DG CHEST PORT 1 VIEW Result Date: 02/12/2024 CLINICAL DATA:  Dyspnea. COPD with acute exacerbation. History of heart failure with recent drop in ejection fraction, but appears euvolemic on exam. EXAM: PORTABLE CHEST 1 VIEW COMPARISON:  Radiograph 02/09/2024, CT 02/04/2024 FINDINGS: The heart is enlarged, unchanged. Stable mediastinal contours. Aortic atherosclerosis. Bronchial thickening. Diminished interstitial prominence. No confluent airspace disease, significant pleural effusion, or pneumothorax. Remote left rib fractures. IMPRESSION: 1. Chronic cardiomegaly. 2. Bronchial thickening favors bronchial inflammation over pulmonary edema. Electronically Signed   By: Andrea Gasman M.D.   On: 02/12/2024 10:47   DG Chest Port 1 View Result Date: 02/09/2024 EXAM: 1 VIEW XRAY OF THE CHEST 02/09/2024 06:26:00 AM COMPARISON: 11/13/2023 CLINICAL HISTORY: sob. shob FINDINGS: LUNGS AND PLEURA: Mild interstitial prominence. No focal pulmonary opacity. No pleural effusion. No pneumothorax. HEART AND MEDIASTINUM: Stable cardiomegaly. Aortic atherosclerosis. BONES AND SOFT TISSUES: No acute osseous abnormality. IMPRESSION: 1. Mild interstitial prominence likely due to pulmonary vascular congestion or early congestive heart failure . 2. Stable cardiomegaly. 3. Aortic atherosclerosis. Electronically signed by: Waddell Calk MD 02/09/2024 06:41 AM EDT RP Workstation: [id number]     Assessment and Plan: Jeffrey Graefe is a 68 y.o. male with a hx of CAD, HFimpEF, Hypertension, Hyperlipidemia, Paroxysmal atrial fibrillation  on Xarelto , prior CVA, tobacco abuse, COPD on home oxygen  who is being seen 02/12/2024 for the evaluation of SVT at the request of Reyes Fenton MD.  Tobacco abuse COPD exacerbation Patient presented to the emergency department for worsening shortness of breath.  Was treated with DuoNebs, Pulmicort , and arformoterol .  Over the past 2 to 3 days has had recurrent episodes of what appeared to be SVT with rates in the 140s to 160s.  It is possible that these nebulizers are contributing to these episodes.   PSVT possible atrial tachycardia versus atrial tachycardia -> on my review, these episodes do appear to be more consistent with SVT, but will ask EP to opine. H/o Atrial fibrillation Hypertension Pressures have been slightly elevated most recent BP 145/92.  Over the past 2 to 3 days has had multiple runs of SVT with rates in the 140s to  160s.  These started suddenly and end suddenly.  These are suspicious for reentrant tachycardia vs atrial tachycardia Order TSH  Convert to more beta-1 selective beta-blocker:s stop metoprolol  succinate 50mg   due to ongoing COPD exacerbation. Start bisoprolol  5mg  daily.   Will avoid CCB due to reduced LVEF.  Continue hydralazine  25 mg every 8 hours  Continue Xarelto  20 mg daily. Plan for outpatient cardiac monitor to evaluate SVT burden in the outpatient setting. I have curb sided EP to see if they think patient should follow up for an outpatient ablation.   Acute on chronic systolic and diastolic heart failure Echocardiogram showed a reduced LVEF of 45%, global hypokinesis, mild eccentric LVH, G2 DD, elevated LAP and RAP I's and O's are up 1.06 L since admission.  Weight is down about 8 pounds since admission.  Continue p.o. Lasix  40 mg twice daily. GDMT Continue hydralazine  25 mg every 8 hours.=> Reassess blood pressure after initiation of bisoprolol , and consider converting to ARB/Entresto  prior to discharge. Continue Imdur  30mg  daily Convert from Toprol   to bisoprolol  for more appropriate beta-1 selectivity.   CAD Hyperlipidemia PAD Cardiac catheterization in 2012 showed mild CAD. On 07/2023 was hospitalized with critical limb ischemia.  Lower extremity angiography showed stent malfunction in the femoral artery due to the previous stent prolapsing and causing stenosis.  This was treated by placing a second stent. Continue atorvastatin  80 mg daily Continue Zetia  10 mg daily On aspirin  and Xarelto  because of PV procedure on 07/2023 -> will need to clarify the duration of aspirin  while on DOAC with vascular surgeons.     Risk Assessment/Risk Scores:       CHA2DS2-VASc Score = 6 = on DOAC although need to monitor with history of bleed.  This indicates a 9.7% annual risk of stroke. The patient's score is based upon: CHF History: 1 HTN History: 1 Diabetes History: 0 Stroke History: 2 Vascular Disease History: 1 Age Score: 1 Gender Score: 0      For questions or updates, please contact [facility] Please consult [email or web address] for contact info under    Signed, Morse Clause, PA-C  02/12/2024 1:02 PM     ATTENDING ATTESTATION  I have seen, examined and evaluated the patient this afternoon along with Zane Adams, PA.  After reviewing all the available data and chart, we discussed the patients laboratory, study & physical findings as well as symptoms in detail.  I agree with his findings, examination as well as impression recommendations as per our discussion.    Attending adjustments noted in italics.  We discussed the plan as written above. - Convert from Toprol  to bisoprolol  at potentially higher dose - Continue hydralazine  with low threshold to convert to ARB/Entresto  based on reduced EF - Diuresed well after initial IV diuresis.  Initial echocardiogram did suggest elevated LAP.  Seems relatively euvolemic on exam, but low threshold to consider additional IV dose of Lasix .  For now would continue Lasix  40 mg twice daily  p.o.   Will follow along    Alm MICAEL Clay, MD, MS Alm Clay, M.D., M.S. Interventional Cardiologist  [facility] Pager # [phone]

## 2024-02-12 NOTE — Progress Notes (Signed)
 Mobility Specialist Progress Note:    02/12/24 0945  Mobility  Activity Ambulated with assistance  Level of Assistance Modified independent, requires aide device or extra time  Assistive Device Front wheel walker  Distance Ambulated (ft) 200 ft  Activity Response Tolerated fair  Mobility Referral Yes  Mobility visit 1 Mobility  Mobility Specialist Start Time (ACUTE ONLY) 0945  Mobility Specialist Stop Time (ACUTE ONLY) 0955  Mobility Specialist Time Calculation (min) (ACUTE ONLY) 10 min   Pt agreeable to session. No c/o any symptoms but pt HR ranging between 95-170s. Pt ambulating and moving well w/ only RW for assist. Returned pt to bed w/ all needs met. Notified RN  Venetia Keel Mobility Specialist Please Contact via SecureChat or Rehab Office at [phone]

## 2024-02-12 NOTE — Plan of Care (Signed)
  Problem: Education: Goal: Knowledge of General Education information will improve Description: Including pain rating scale, medication(s)/side effects and non-pharmacologic comfort measures Outcome: Progressing   Problem: Health Behavior/Discharge Planning: Goal: Ability to manage health-related needs will improve Outcome: Progressing   Problem: Coping: Goal: Level of anxiety will decrease Outcome: Progressing   Problem: Safety: Goal: Ability to remain free from injury will improve Outcome: Progressing   Problem: Skin Integrity: Goal: Risk for impaired skin integrity will decrease Outcome: Progressing   Problem: Clinical Measurements: Goal: Ability to maintain clinical measurements within normal limits will improve Outcome: Not Progressing Goal: Respiratory complications will improve Outcome: Not Progressing Goal: Cardiovascular complication will be avoided Outcome: Not Progressing   Problem: Activity: Goal: Risk for activity intolerance will decrease Outcome: Not Progressing

## 2024-02-12 NOTE — Consult Note (Signed)
 NAME:  Jared [name], MRN:  [id number], DOB:  Oct 07, 1955, LOS: 3 ADMISSION DATE:  02/09/2024, CONSULTATION DATE:  02/12/24 REFERRING MD:  Letha Cheadle, MD CHIEF COMPLAINT:  COPD exacerbation   History of Present Illness:  68 year old male active smoker with COPD, chronic hypoxemic and hypercarbic respiratory failure and remaining PMHx below who p/w SOB x 1 day and admitted for COPD exacerbation to [facility]. Influenza, RSV and covid negative. RVP 20 neg. CXR 9/3 without infiltrate. Patient on 3L via [place] at baseline. Admission ABG with compensated respiratory acidosis. Treated with nebulizers, steroids and antibiotics. PCCM consulted for additional recommendations.  Followed by Dr. Darlean at [facility]. Last seen 07/04/23.  Patient still active smoker. On Breztri . Denies shortness of breath while on BiPAP. No wheezing. Productive cough has improved.   Pertinent  Medical History  HFrEF, HTN, hx CABG, chronic systolic heart failure, PAF on Xarelto , T2DM, CAD, IDA, TUD, PAD, SVT  Significant Hospital Events: Including procedures, antibiotic start and stop dates in addition to other pertinent events     Interim History / Subjective:  As above  Objective    Blood pressure (!) 145/92, pulse 77, temperature 98.8 F (37.1 C), resp. rate 18, height 6' 1 (1.854 m), weight 92.2 kg, SpO2 99%.    FiO2 (%):  [32 %-40 %] 40 % PEEP:  [5 cmH20] 5 cmH20   Intake/Output Summary (Last 24 hours) at 02/12/2024 1450 Last data filed at 02/12/2024 0900 Gross per 24 hour  Intake 840 ml  Output 150 ml  Net 690 ml   Filed Weights   02/10/24 0433 02/11/24 0500 02/12/24 0418  Weight: 90.7 kg 91.4 kg 92.2 kg   Physical Exam: General: Chronically ill-appearing, no acute distress HENT: Colon, AT Eyes: EOMI, no scleral icterus Respiratory: Diminished to auscultation bilaterally.  No crackles, wheezing or rales Cardiovascular: RRR, -M/R/G, no JVD Extremities:-Edema,-tenderness Neuro: AAO x4, CNII-XII grossly  intact Psych: Normal mood, normal affect  Assessment and Plan   Severe COPD (FEV1 41%, DLCO 27% on PFT 04/04/20) Chronic hypercarbic and hypoxemic respiratory failure Active smoker with severe chronic lung disease that has likely progressed since 2021 PFTs.  --No indication for continuous BiPAP. Recommend BiPAP nightly. Primary team has arranged for BiPAP at home --Increased Pulmicort  0.5 mg BID --DC Yupelri . Start Duoneb q6h --Prednisone  40 mg x 5 days total --Pulmonary toilet --Agree with antibiotics --Agree with diuresis to keep net even/negative --Recommend Palliative consult. I discussed GOC and patient wishes to remain full code but stated he would not want tracheostomy. I encouraged him to discuss with family including his sister who is his HCPOA --Will need outpatient follow-up when closer to discharge  Subcentimeter lung nodules --CT lung screen 02/04/24 reviewed. Prior nodules seen in RML and RLL appear resolved --Continue annual lung screen  Labs   CBC: Recent Labs  Lab 02/06/24 1417 02/09/24 0637 02/09/24 0644 02/10/24 0247 02/11/24 0259 02/12/24 0217  WBC 6.0 13.6*  --  5.8 10.9* 8.2  NEUTROABS 4.1  --   --   --   --   --   HGB 11.5* 12.0* 14.3 11.3* 10.7* 10.7*  HCT 41.7 45.5 42.0 42.0 39.2 38.9*  MCV 91.9 96.6  --  94.0 94.0 93.7  PLT 162 228  --  203 212 211    Basic Metabolic Panel: Recent Labs  Lab 02/09/24 0637 02/09/24 0644 02/10/24 0247 02/11/24 0259 02/12/24 0217  NA 144 143 143 144 144  K 3.8 3.4* 3.7 3.7 3.8  CL  99  --  94* 94* 94*  CO2 35*  --  41* 38* 41*  GLUCOSE 142*  --  175* 133* 128*  BUN 6*  --  15 14 14   CREATININE 0.76  --  0.83 0.76 0.65  CALCIUM  8.7*  --  9.2 8.8* 8.8*  MG  --   --  2.1  --   --    GFR: Estimated Creatinine Clearance: 97.1 mL/min (by C-G formula based on SCr of 0.65 mg/dL). Recent Labs  Lab 02/09/24 0637 02/10/24 0247 02/11/24 0259 02/12/24 0217  WBC 13.6* 5.8 10.9* 8.2    Liver Function  Tests: No results for input(s): AST, ALT, ALKPHOS, BILITOT, PROT, ALBUMIN  in the last 168 hours. No results for input(s): LIPASE, AMYLASE in the last 168 hours. No results for input(s): AMMONIA in the last 168 hours.  ABG    Component Value Date/Time   PHART 7.37 02/10/2024 1227   PCO2ART 76 (HH) 02/10/2024 1227   PO2ART 62 (L) 02/10/2024 1227   HCO3 48.0 (H) 02/12/2024 1319   TCO2 44 (H) 02/09/2024 0644   O2SAT 98 02/12/2024 1319     Coagulation Profile: No results for input(s): INR, PROTIME in the last 168 hours.  Cardiac Enzymes: No results for input(s): CKTOTAL, CKMB, CKMBINDEX, TROPONINI in the last 168 hours.  HbA1C: HbA1c, POC (controlled diabetic range)  Date/Time Value Ref Range Status  06/10/2023 10:04 AM 5.4 0.0 - 7.0 % Final  10/25/2022 09:41 AM 5.0 0.0 - 7.0 % Final    CBG: No results for input(s): GLUCAP in the last 168 hours.  Review of Systems:   Review of Systems  Constitutional:  Negative for chills, diaphoresis, fever, malaise/fatigue and weight loss.  HENT:  Negative for congestion.   Respiratory:  Positive for shortness of breath. Negative for cough, hemoptysis, sputum production and wheezing.   Cardiovascular:  Negative for chest pain, palpitations and leg swelling.     Past Medical History:  He,  has a past medical history of Acute blood loss anemia (05/18/2023), Acute perforated appendicitis (04/30/2023), AKI (acute kidney injury) (HCC) (11/18/2020), Atrial fibrillation with RVR (HCC) (05/06/2023), AVM (arteriovenous malformation), CAD (coronary artery disease), CAP (community acquired pneumonia) (09/2013), Chronic combined systolic and diastolic CHF (congestive heart failure) (HCC), Chronic respiratory failure (HCC), Cluster headache, COPD (chronic obstructive pulmonary disease) (HCC), DM (diabetes mellitus) (HCC), History of CVA (cerebrovascular accident), Hypertension, IDA (iron  deficiency anemia), Moderate tobacco  use disorder, NICM (nonischemic cardiomyopathy) (HCC), Nicotine  addiction, Peripheral vascular disease (HCC), Syncope (06/11/2019), Tobacco abuse, and Type 2 diabetes mellitus (HCC) (05/14/2016).   Surgical History:   Past Surgical History:  Procedure Laterality Date   ABDOMINAL AORTOGRAM W/LOWER EXTREMITY N/A 12/07/2022   Procedure: ABDOMINAL AORTOGRAM W/LOWER EXTREMITY;  Surgeon: Magda Debby SAILOR, MD;  [facility]: [facility];  Service: Cardiovascular;  Laterality: N/A;   ABDOMINAL AORTOGRAM W/LOWER EXTREMITY Left 08/02/2023   Procedure: ABDOMINAL AORTOGRAM W/LOWER EXTREMITY;  Surgeon: Magda Debby SAILOR, MD;  Location: [facility];  Service: Cardiovascular;  Laterality: Left;   BALLOON ENTEROSCOPY N/A 11/19/2023   Procedure: ENTEROSCOPY, USING BALLOON;  Surgeon: San Sandor GAILS, DO;  Location: [facility];  Service: Gastroenterology;  Laterality: N/A;  single balloon enteroscopy   BIOPSY  11/12/2020   Procedure: BIOPSY;  Surgeon: Wilhelmenia Aloha Raddle., MD;  Location: [facility];  Service: Gastroenterology;;   CARDIAC CATHETERIZATION  10/2010   LM normal, LAD with 20% irregularities, LCX with 20%, RCA with 40% prox and 40% distal - EF of  35%   CATARACT EXTRACTION, BILATERAL     COLONOSCOPY W/ BIOPSIES AND POLYPECTOMY     COLONOSCOPY WITH PROPOFOL  N/A 09/06/2018   Procedure: COLONOSCOPY WITH PROPOFOL ;  Surgeon: Eda Iha, MD;  Location: [facility] ENDOSCOPY;  Service: Gastroenterology;  Laterality: N/A;   ENTEROSCOPY N/A 09/28/2018   Procedure: ENTEROSCOPY;  Surgeon: Rollin Dover, MD;  Location: [facility] ENDOSCOPY;  Service: Endoscopy;  Laterality: N/A;   ENTEROSCOPY N/A 10/28/2018   Procedure: ENTEROSCOPY;  Surgeon: Eda Iha, MD;  Location: [facility] ENDOSCOPY;  Service: Gastroenterology;  Laterality: N/A;   ENTEROSCOPY N/A 10/09/2020   Procedure: ENTEROSCOPY;  Surgeon: Abran Norleen SAILOR, MD;  Location: [facility] ENDOSCOPY;  Service: Endoscopy;  Laterality: N/A;   ENTEROSCOPY N/A 03/22/2022    Procedure: ENTEROSCOPY;  Surgeon: Leigh Elspeth SQUIBB, MD;  Location: [facility] ENDOSCOPY;  Service: Gastroenterology;  Laterality: N/A;   ENTEROSCOPY N/A 05/20/2023   Procedure: ENTEROSCOPY;  Surgeon: Legrand Victory LITTIE DOUGLAS, MD;  Location: [facility] ENDOSCOPY;  Service: Gastroenterology;  Laterality: N/A;   ENTEROSCOPY N/A 08/24/2023   Procedure: ENTEROSCOPY;  Surgeon: Suzann [name] HERO, MD;  Location: [facility] ENDOSCOPY;  Service: Gastroenterology;  Laterality: N/A;   ESOPHAGOGASTRODUODENOSCOPY N/A 11/12/2020   Procedure: ESOPHAGOGASTRODUODENOSCOPY (EGD);  Surgeon: Wilhelmenia Aloha Raddle., MD;  Location: [facility];  Service: Gastroenterology;  Laterality: N/A;   ESOPHAGOGASTRODUODENOSCOPY (EGD) WITH PROPOFOL  N/A 09/05/2018   Procedure: ESOPHAGOGASTRODUODENOSCOPY (EGD) WITH PROPOFOL ;  Surgeon: Leigh Elspeth SQUIBB, MD;  Location: [facility] ENDOSCOPY;  Service: Gastroenterology;  Laterality: N/A;   ESOPHAGOGASTRODUODENOSCOPY (EGD) WITH PROPOFOL  N/A 11/19/2020   Procedure: ESOPHAGOGASTRODUODENOSCOPY (EGD) WITH PROPOFOL ;  Surgeon: Albertus Gordy HERO, MD;  Location: [facility];  Service: Gastroenterology;  Laterality: N/A;   ESOPHAGOGASTRODUODENOSCOPY (EGD) WITH PROPOFOL  N/A 12/27/2022   Procedure: ESOPHAGOGASTRODUODENOSCOPY (EGD) WITH PROPOFOL ;  Surgeon: Shila Gustav GAILS, MD;  Location: [facility];  Service: Gastroenterology;  Laterality: N/A;   EXCISION MASS HEAD     GIVENS CAPSULE STUDY N/A 09/06/2018   Procedure: GIVENS CAPSULE STUDY;  Surgeon: Eda Iha, MD;  Location: [facility] ENDOSCOPY;  Service: Gastroenterology;  Laterality: N/A;   GIVENS CAPSULE STUDY N/A 09/26/2018   Procedure: GIVENS CAPSULE STUDY;  Surgeon: Albertus Gordy HERO, MD;  Location: [facility] ENDOSCOPY;  Service: Gastroenterology;  Laterality: N/A;   GIVENS CAPSULE STUDY N/A 06/14/2019   Procedure: GIVENS CAPSULE STUDY;  Surgeon: Shila Gustav GAILS, MD;  Location: [facility];  Service: Endoscopy;  Laterality: N/A;   HEMOSTASIS CLIP PLACEMENT  11/12/2020   Procedure:  HEMOSTASIS CLIP PLACEMENT;  Surgeon: Wilhelmenia Aloha Raddle., MD;  Location: [facility];  Service: Gastroenterology;;   HEMOSTASIS CONTROL  11/12/2020   Procedure: HEMOSTASIS CONTROL;  Surgeon: Wilhelmenia Aloha Raddle., MD;  Location: [facility];  Service: Gastroenterology;;   HOT HEMOSTASIS N/A 10/28/2018   Procedure: HOT HEMOSTASIS (ARGON PLASMA COAGULATION/BICAP);  Surgeon: Eda Iha, MD;  Location: [facility] ENDOSCOPY;  Service: Gastroenterology;  Laterality: N/A;   HOT HEMOSTASIS N/A 11/12/2020   Procedure: HOT HEMOSTASIS (ARGON PLASMA COAGULATION/BICAP);  Surgeon: Wilhelmenia Aloha Raddle., MD;  Location: [facility];  Service: Gastroenterology;  Laterality: N/A;   HOT HEMOSTASIS N/A 03/22/2022   Procedure: HOT HEMOSTASIS (ARGON PLASMA COAGULATION/BICAP);  Surgeon: Leigh Elspeth SQUIBB, MD;  Location: [facility] ENDOSCOPY;  Service: Gastroenterology;  Laterality: N/A;   HOT HEMOSTASIS N/A 05/20/2023   Procedure: HOT HEMOSTASIS (ARGON PLASMA COAGULATION/BICAP);  Surgeon: Legrand Victory LITTIE DOUGLAS, MD;  Location: [facility] ENDOSCOPY;  Service: Gastroenterology;  Laterality: N/A;   HOT HEMOSTASIS N/A 08/24/2023   Procedure: EGD, WITH ARGON PLASMA COAGULATION;  Surgeon: Suzann [name] HERO, MD;  Location:  [facility];  Service: Gastroenterology;  Laterality: N/A;   INCISION AND DRAINAGE PERIRECTAL ABSCESS N/A 06/05/2017   Procedure: IRRIGATION AND DEBRIDEMENT PERIRECTAL ABSCESS;  Surgeon: Teresa Lonni HERO, MD;  Location: [facility];  Service: General;  Laterality: N/A;   IR CATHETER TUBE CHANGE  05/14/2023   LAPAROSCOPIC APPENDECTOMY N/A 05/01/2023   Procedure: APPENDECTOMY LAPAROSCOPIC;  Surgeon: Ebbie Cough, MD;  Location: [facility] OR;  Service: General;  Laterality: N/A;   PERIPHERAL VASCULAR BALLOON ANGIOPLASTY Left 08/02/2023   Procedure: PERIPHERAL VASCULAR BALLOON ANGIOPLASTY;  Surgeon: Magda Debby SAILOR, MD;  [facility];  Service: Cardiovascular;  Laterality: Left;   PERIPHERAL VASCULAR INTERVENTION   12/07/2022   Procedure: PERIPHERAL VASCULAR INTERVENTION;  Surgeon: Magda Debby SAILOR, MD;  Location: [facility];  Service: Cardiovascular;;   PERIPHERAL VASCULAR INTERVENTION Left 08/02/2023   Procedure: PERIPHERAL VASCULAR INTERVENTION;  Surgeon: Magda Debby SAILOR, MD;  Location: [facility];  Service: Cardiovascular;  Laterality: Left;   SUBMUCOSAL TATTOO INJECTION  11/12/2020   Procedure: SUBMUCOSAL TATTOO INJECTION;  Surgeon: Wilhelmenia Aloha Raddle., MD;  Location: [facility];  Service: Gastroenterology;;   SUBMUCOSAL TATTOO INJECTION  05/20/2023   Procedure: SUBMUCOSAL TATTOO INJECTION;  Surgeon: Legrand Victory LITTIE DOUGLAS, MD;  Location: [facility] ENDOSCOPY;  Service: Gastroenterology;;   VIDEO BRONCHOSCOPY Bilateral 05/08/2016   Procedure: VIDEO BRONCHOSCOPY WITH FLUORO;  Surgeon: Harden LULLA Staff, MD;  Location: [facility] ENDOSCOPY;  Service: Cardiopulmonary;  Laterality: Bilateral;     Social History:   reports that he has been smoking cigarettes. He has a 23.5 pack-year smoking history. He has never used smokeless tobacco. He reports that he does not currently use alcohol. He reports that he does not use drugs.   Family History:  His family history includes Cancer in his father; Colon cancer in his mother; Diabetes in his brother, mother, and sister; Heart disease in his mother; Liver cancer in his mother.   Allergies Allergies  Allergen Reactions   Lisinopril  Cough     Home Medications  Prior to Admission medications   Medication Sig Start Date End Date Taking? Authorizing Provider  albuterol  (PROVENTIL ) (2.5 MG/3ML) 0.083% nebulizer solution Take 3 mLs (2.5 mg total) by nebulization every 2 (two) hours as needed for wheezing or shortness of breath. 10/17/23  Yes Darlean Ozell NOVAK, MD  albuterol  (VENTOLIN  HFA) 108 (90 Base) MCG/ACT inhaler INHALE 2 PUFFS INTO THE LUNGS EVERY 6 (SIX) HOURS AS NEEDED FOR WHEEZING OR SHORTNESS OF BREATH. 09/09/23  Yes Newlin, Enobong, MD  aspirin  EC 81 MG tablet  Take 81 mg by mouth daily. Swallow whole.   Yes [provider]  atorvastatin  (LIPITOR ) 80 MG tablet Take 1 tablet (80 mg total) by mouth daily. 09/09/23  Yes Newlin, Enobong, MD  budesonide -glycopyrrolate -formoterol  (BREZTRI  AEROSPHERE) 160-9-4.8 MCG/ACT AERO inhaler Take 2 puffs first thing in morning and then another 2 puffs about 12 hours later. 08/15/23  Yes Darlean Ozell NOVAK, MD  DULoxetine  (CYMBALTA ) 60 MG capsule Take 1 capsule (60 mg total) by mouth daily. For chronic leg pains 09/09/23  Yes Newlin, Corrina, MD  ergocalciferol  (DRISDOL ) 1.25 MG (50000 UT) capsule Take 1 capsule (50,000 Units total) by mouth once a week. 11/13/23  Yes Newlin, Enobong, MD  ezetimibe  (ZETIA ) 10 MG tablet Take 1 tablet (10 mg total) by mouth daily. 06/06/23  Yes Okey Vina LULLA, MD  ferrous sulfate  (FEROSUL) 325 (65 FE) MG tablet Take 1 tablet (325 mg total) by mouth 2 (two) times daily with a meal.  09/09/23  Yes Newlin, Enobong, MD  folic acid  (FOLVITE ) 1 MG tablet Take 1 tablet (1 mg total) by mouth daily. 09/09/23  Yes Newlin, Enobong, MD  furosemide  (LASIX ) 40 MG tablet Take 1 tablet (40 mg total) by mouth 2 (two) times daily. 09/09/23  Yes Newlin, Enobong, MD  gabapentin  (NEURONTIN ) 300 MG capsule Take 1 capsule (300 mg total) by mouth at bedtime. For leg pains 11/20/23  Yes Newlin, Enobong, MD  hydrALAZINE  (APRESOLINE ) 25 MG tablet Take 1 tablet (25 mg total) by mouth every 8 (eight) hours. Patient taking differently: Take 25 mg by mouth in the morning and at bedtime. 12/11/23  Yes Meng, Hao, PA  isosorbide  mononitrate (IMDUR ) 30 MG 24 hr tablet Take 1 tablet (30 mg total) by mouth daily. 11/20/23  Yes Newlin, Enobong, MD  metoprolol  succinate (TOPROL -XL) 50 MG 24 hr tablet Take 1 tablet (50 mg total) by mouth daily. 09/09/23  Yes Newlin, Enobong, MD  nitroGLYCERIN  (NITROSTAT ) 0.4 MG SL tablet Place 0.4 mg under the tongue every 5 (five) minutes as needed for chest pain.   Yes [provider]  pantoprazole   (PROTONIX ) 40 MG tablet Take 1 tablet (40 mg total) by mouth daily. 09/09/23  Yes Newlin, Enobong, MD  rivaroxaban  (XARELTO ) 20 MG TABS tablet Take 1 tablet (20 mg total) by mouth daily with supper. 09/09/23  Yes Newlin, Enobong, MD  OXYGEN  Inhale 2 L/min into the lungs continuous. Patient taking differently: Inhale 3 L/min into the lungs continuous.    [provider]  TUDORZA PRESSAIR  400 MCG/ACT AEPB INHALE 1 PUFF INTO THE LUNGS IN THE MORNING AND AT BEDTIME. 03/29/20 04/04/20  Darlean Ozell NOVAK, MD     Critical care time: N/A    Care Time: 80 min I have spent a total time including chart review, data review, collecting history, coordinating care and discussing medical diagnosis and plan and GOC with the patient. Discussed care with primary team and RT.  Slater Staff, M.D. [facility] Pulmonary/Critical Care Medicine 02/12/2024 2:50 PM   See Amion for personal pager For hours between 7 PM to 7 AM, please call Elink for urgent questions

## 2024-02-12 NOTE — Progress Notes (Signed)
   Discussed with EP and they do not feel like an outpatient follow up to consider an ablation is necessary at this time. Will proceed with outpatient cardiac monitor to assess patients burden.   Signed,  Morse Clause, PA-C 02/12/2024, 4:15 PM

## 2024-02-13 ENCOUNTER — Other Ambulatory Visit: Payer: Self-pay | Admitting: Cardiology

## 2024-02-13 ENCOUNTER — Other Ambulatory Visit (HOSPITAL_COMMUNITY): Payer: Self-pay

## 2024-02-13 ENCOUNTER — Telehealth (HOSPITAL_COMMUNITY): Payer: Self-pay | Admitting: Pharmacy Technician

## 2024-02-13 ENCOUNTER — Inpatient Hospital Stay

## 2024-02-13 ENCOUNTER — Other Ambulatory Visit: Payer: Self-pay

## 2024-02-13 DIAGNOSIS — I471 Supraventricular tachycardia, unspecified: Secondary | ICD-10-CM

## 2024-02-13 DIAGNOSIS — E1169 Type 2 diabetes mellitus with other specified complication: Secondary | ICD-10-CM

## 2024-02-13 LAB — CBC
HCT: 39.3 % (ref 39.0–52.0)
Hemoglobin: 10.6 g/dL — ABNORMAL LOW (ref 13.0–17.0)
MCH: 25.7 pg — ABNORMAL LOW (ref 26.0–34.0)
MCHC: 27 g/dL — ABNORMAL LOW (ref 30.0–36.0)
MCV: 95.4 fL (ref 80.0–100.0)
Platelets: 214 K/uL (ref 150–400)
RBC: 4.12 MIL/uL — ABNORMAL LOW (ref 4.22–5.81)
RDW: 14.4 % (ref 11.5–15.5)
WBC: 8 K/uL (ref 4.0–10.5)
nRBC: 0 % (ref 0.0–0.2)

## 2024-02-13 LAB — TSH: TSH: 1.548 u[IU]/mL (ref 0.350–4.500)

## 2024-02-13 LAB — BASIC METABOLIC PANEL WITH GFR
Anion gap: 5 (ref 5–15)
BUN: 15 mg/dL (ref 8–23)
CO2: 44 mmol/L — ABNORMAL HIGH (ref 22–32)
Calcium: 8.7 mg/dL — ABNORMAL LOW (ref 8.9–10.3)
Chloride: 96 mmol/L — ABNORMAL LOW (ref 98–111)
Creatinine, Ser: 0.77 mg/dL (ref 0.61–1.24)
GFR, Estimated: >60 mL/min (ref >60)
Glucose, Bld: 133 mg/dL — ABNORMAL HIGH (ref 70–99)
Potassium: 4.3 mmol/L (ref 3.5–5.1)
Sodium: 145 mmol/L (ref 135–145)

## 2024-02-13 MED ORDER — SACUBITRIL-VALSARTAN 24-26 MG PO TABS
1.0000 | ORAL_TABLET | Freq: Two times a day (BID) | ORAL | 0 refills | Status: DC
  Filled 2024-02-13 (×2): qty 60, 30d supply, fill #0

## 2024-02-13 MED ORDER — ISOSORB DINITRATE-HYDRALAZINE 20-37.5 MG PO TABS
1.0000 | ORAL_TABLET | Freq: Three times a day (TID) | ORAL | 0 refills | Status: DC
  Filled 2024-02-13 (×2): qty 90, 30d supply, fill #0

## 2024-02-13 MED ORDER — SACUBITRIL-VALSARTAN 24-26 MG PO TABS
1.0000 | ORAL_TABLET | Freq: Two times a day (BID) | ORAL | Status: DC
  Administered 2024-02-13: 1 via ORAL
  Filled 2024-02-13: qty 1

## 2024-02-13 MED ORDER — BUDESONIDE 0.5 MG/2ML IN SUSP
0.5000 mg | Freq: Two times a day (BID) | RESPIRATORY_TRACT | 0 refills | Status: DC
  Filled 2024-02-13: qty 120, 30d supply, fill #0

## 2024-02-13 MED ORDER — IPRATROPIUM-ALBUTEROL 0.5-2.5 (3) MG/3ML IN SOLN
3.0000 mL | Freq: Four times a day (QID) | RESPIRATORY_TRACT | 0 refills | Status: AC
  Filled 2024-02-13 (×2): qty 360, 30d supply, fill #0

## 2024-02-13 MED ORDER — ISOSORB DINITRATE-HYDRALAZINE 20-37.5 MG PO TABS: 1.0000 | ORAL_TABLET | Freq: Three times a day (TID) | ORAL | Status: DC

## 2024-02-13 MED ORDER — BISOPROLOL FUMARATE 5 MG PO TABS
5.0000 mg | ORAL_TABLET | Freq: Every day | ORAL | 0 refills | Status: DC
Start: 2024-02-14 — End: 2024-03-30
  Filled 2024-02-13 (×2): qty 30, 30d supply, fill #0

## 2024-02-13 NOTE — Progress Notes (Signed)
 14d Zio ordered per Dr. Anner

## 2024-02-13 NOTE — Plan of Care (Signed)
  Problem: Education: Goal: Knowledge of General Education information will improve Description: Including pain rating scale, medication(s)/side effects and non-pharmacologic comfort measures Outcome: Adequate for Discharge   Problem: Health Behavior/Discharge Planning: Goal: Ability to manage health-related needs will improve Outcome: Adequate for Discharge   Problem: Clinical Measurements: Goal: Ability to maintain clinical measurements within normal limits will improve Outcome: Adequate for Discharge Goal: Will remain free from infection Outcome: Adequate for Discharge Goal: Diagnostic test results will improve Outcome: Adequate for Discharge Goal: Respiratory complications will improve Outcome: Adequate for Discharge Goal: Cardiovascular complication will be avoided Outcome: Adequate for Discharge   Problem: Activity: Goal: Risk for activity intolerance will decrease Outcome: Adequate for Discharge   Problem: Coping: Goal: Level of anxiety will decrease Outcome: Adequate for Discharge   Problem: Elimination: Goal: Will not experience complications related to urinary retention Outcome: Adequate for Discharge   Problem: Pain Managment: Goal: General experience of comfort will improve and/or be controlled Outcome: Adequate for Discharge   Problem: Safety: Goal: Ability to remain free from injury will improve Outcome: Adequate for Discharge   Problem: Skin Integrity: Goal: Risk for impaired skin integrity will decrease Outcome: Adequate for Discharge

## 2024-02-13 NOTE — Plan of Care (Signed)
   Problem: Education: Goal: Knowledge of General Education information will improve Description: Including pain rating scale, medication(s)/side effects and non-pharmacologic comfort measures Outcome: Progressing   Problem: Clinical Measurements: Goal: Ability to maintain clinical measurements within normal limits will improve Outcome: Progressing   Problem: Clinical Measurements: Goal: Respiratory complications will improve Outcome: Progressing

## 2024-02-13 NOTE — Progress Notes (Signed)
 Physical Therapy Treatment/ discharge Patient Details Name: Jared [name] MRN: [id number] DOB: 1955/07/19 Today's Date: 02/13/2024   History of Present Illness 68 yo male adm 02/09/24 with SOB and COPD exacerbation. PMHx: perforated appendicitis, syncope, AVM, CABG, CAD, CAP, HFrEF, COPD on 2-3L home O2, CVA, HTN, A flutter, DM2    PT Comments  Pt pleasant and mobilizing well. Pt on 4L during activity and gait with desaturation to 87% after 200' of gait, seated rest on 6L to recover within 1 min then returned to 4L. Pt with HR 88-92 during gait this session and pt performing transfers and stairs without need for assist. Pt at baseline functional level, educated for progressive gait, sit to stands for strengthening and endurance and encouraged gait with staff acutely. No further needs at this time and will defer to mobility specialists.    If plan is discharge home, recommend the following: Assistance with cooking/housework   Can travel by private vehicle        Equipment Recommendations  None recommended by PT    Recommendations for Other Services       Precautions / Restrictions Precautions Precautions: Other (comment) Recall of Precautions/Restrictions: Intact Precaution/Restrictions Comments: watch HR and SPO2, UB jerking at times     Mobility  Bed Mobility               General bed mobility comments: in chair on arrival and end of session    Transfers Overall transfer level: Modified independent                 General transfer comment: Pt able to stand from chair without armrests as well as from recliner, total of 11 repeated trials without UB support    Ambulation/Gait Ambulation/Gait assistance: Modified independent (Device/Increase time) Gait Distance (Feet): 350 Feet Assistive device: Rolling walker (2 wheels) Gait Pattern/deviations: Step-through pattern, Decreased stride length   Gait velocity interpretation: 1.31 - 2.62 ft/sec, indicative of limited  community ambulator   General Gait Details: pt with HR 88-92 and maintained >90% on initial 200' on 4L. return gait with desaturation to 87%, cues for posture, speed and breathing technique. Pt denied standing to rest and stated he breathes better when moving   Stairs Stairs: Yes Stairs assistance: Modified independent (Device/Increase time) Stair Management: One rail Right, Alternating pattern, Forwards Number of Stairs: 5     Wheelchair Mobility     Tilt Bed    Modified Rankin (Stroke Patients Only)       Balance Overall balance assessment: Mild deficits observed, not formally tested                                          Communication Communication Communication: No apparent difficulties  Cognition Arousal: Alert Behavior During Therapy: WFL for tasks assessed/performed   PT - Cognitive impairments: No apparent impairments                         Following commands: Intact      Cueing Cueing Techniques: Verbal cues  Exercises      General Comments        Pertinent Vitals/Pain Pain Assessment Pain Assessment: No/denies pain    Home Living                          Prior Function  PT Goals (current goals can now be found in the care plan section) Progress towards PT goals: Goals met/education completed, patient discharged from PT    Frequency    Min 1X/week      PT Plan      Co-evaluation              AM-PAC PT 6 Clicks Mobility   Outcome Measure  Help needed turning from your back to your side while in a flat bed without using bedrails?: None Help needed moving from lying on your back to sitting on the side of a flat bed without using bedrails?: None Help needed moving to and from a bed to a chair (including a wheelchair)?: None Help needed standing up from a chair using your arms (e.g., wheelchair or bedside chair)?: None Help needed to walk in hospital room?: None Help needed  climbing 3-5 steps with a railing? : None 6 Click Score: 24    End of Session Equipment Utilized During Treatment: Oxygen  Activity Tolerance: Patient tolerated treatment well Patient left: in chair;with call bell/phone within reach Nurse Communication: Mobility status PT Visit Diagnosis: Other abnormalities of gait and mobility (R26.89)     Time: 9075-9055 PT Time Calculation (min) (ACUTE ONLY): 20 min  Charges:    $Gait Training: 8-22 mins PT General Charges $$ ACUTE PT VISIT: 1 Visit                     Lenoard SQUIBB, PT Acute Rehabilitation Services Office: [phone]    Lenoard NOVAK Woodruff Skirvin 02/13/2024, 10:43 AM

## 2024-02-13 NOTE — Progress Notes (Addendum)
 Progress Note  Patient Name: Jared [name] Date of Encounter: 02/13/2024 [facility] Cardiologist: Vina Gull, MD   Interval Summary    Up with PT walking this morning, breathing has improved. Remains on [place] @4L  at rest, up to 6 with walking.   Looks better today.  No longer using BiPAP, but is still on high oxygen  levels. Telemetry review shows much less frequent and shorter bursts of PSVT.  Vital Signs Vitals:   02/13/24 0608 02/13/24 0726 02/13/24 0755 02/13/24 0800  BP: (!) 152/87 139/81  (!) 141/87  Pulse:  75 78 78  Resp:  (!) 21 20 14   Temp:  98.6 F (37 C)  98.7 F (37.1 C)  TempSrc:  Oral  Oral  SpO2:  97%  100%  Weight:      Height:        Intake/Output Summary (Last 24 hours) at 02/13/2024 0902 Last data filed at 02/13/2024 0400 Gross per 24 hour  Intake 600 ml  Output 550 ml  Net 50 ml      02/12/2024    4:18 AM 02/11/2024    5:00 AM 02/10/2024    4:33 AM  Last 3 Weights  Weight (lbs) 203 lb 4.2 oz 201 lb 8 oz 200 lb  Weight (kg) 92.2 kg 91.4 kg 90.719 kg      Telemetry/ECG  Sinus Rhythm, brief runs of pSVT (improved) - Personally Reviewed  Physical Exam  GEN: No acute distress.  On [place] @4L  Neck: No JVD Cardiac: RRR, no murmurs, rubs, or gallops.  Respiratory: Clear to auscultation bilaterally. GI: Soft, nontender, non-distended  MS: No edema  Assessment & Plan   Jared [name] is a 68 y.o. male with a hx of CAD, HFimpEF, Hypertension, Hyperlipidemia, Paroxysmal atrial fibrillation on Xarelto , prior CVA, tobacco abuse, COPD on home oxygen  who is being seen 02/12/2024 for the evaluation of SVT at the request of Reyes Fenton MD.   pSVT Hx of Paroxsymal atrial fibrillation -- switched to Bisoprolol  yesterday, continues to have brief runs of pSVT but improved since yesterday  -- curbside via EP, no plans for outpatient follow up at this time  => would be reasonable to order 14-day Zio patch monitor for discharge to assess for recurrence once he is  stabilized and is no longer on significant beta agonist/ critical illness state. -- on Xarelto  20mg  daily  Acute HFmrEF -- Echo this admission with drop in LVEF to 45%, global hypokinesis, g2DD, normal RV function, mildly enlarged, small pericardial effusion -- GDMT: currently on Bisoprolol  5mg  daily, hydralazine  25mg  TID, Imdur  30mg  daily, lasix  40mg  BID. Would switch to BiDil  tomorrow, add Entresto  24/26 (reports issue with lisinopril  but unclear if trouble with Entresto  therefore will re-trial) -- weight down 211>>203.26lbs => Would not be unreasonable to recheck EF in 2 to 3 months in the outpatient setting to reestablish new baseline.  Non-obstructive CAD -- cardiac cath 2012, no anginal symptoms PTA -- on atorvastatin  80mg  daily, Zetia  10mg  daily, ASA 81mg  daily   COPD exacerbation Tobacco use -- per primary    For questions or updates, please contact [facility] Please consult [email or web address] for contact info under       Signed, Manuelita Rummer, NP    ATTENDING ATTESTATION  I have seen, examined and evaluated the patient this morning on team rounds along with Manuelita Rummer, NP-C and Mercy Bold, [facility].  After reviewing all the available data and chart, we discussed the patients laboratory, study & physical findings as well  as symptoms in detail.  I agree with her findings, examination as well as impression recommendations as per our discussion.    Attending adjustments noted in italics.   Looks better today.  Titrating medications as noted.    Alm MICAEL Clay, MD, MS Alm Clay, M.D., M.S. Interventional Cardiologist  [facility] Pager # [phone]

## 2024-02-13 NOTE — Progress Notes (Signed)
 DISCHARGE NOTE HOME Kathryn Rounds to be discharged Home per MD order. Discussed prescriptions and follow up appointments with the patient. Prescriptions given to patient; medication list explained in detail. Patient verbalized understanding.  Skin clean, dry and intact without evidence of skin break down, no evidence of skin tears noted. IV catheter discontinued intact. Site without signs and symptoms of complications. Dressing and pressure applied. Pt denies pain at the site currently. No complaints noted.  Patient free of lines, drains, and wounds.   An After Visit Summary (AVS) was printed and given to the patient. Patient escorted via wheelchair, and discharged home via private auto.  Peyton SHAUNNA Pepper, RN

## 2024-02-13 NOTE — Telephone Encounter (Signed)
 Patient Product/process development scientist completed.    The patient is insured through [employer]. Patient has Medicare and is not eligible for a copay card, but may be able to apply for patient assistance or Medicare RX Payment Plan (Patient Must reach out to their plan, if eligible for payment plan), if available.    Ran test claim for Breztri  Inhaler and the current 30 day co-pay is $0.00.  Ran test claim for arformoterol  15 mcg/2 ml and the current 30 day co-pay is $0.00.  Ran test claim for formoterol  20 mcg/2 ml and the current 30 day co-pay is $0.00.  This test claim was processed through [facility]- copay amounts may vary at other pharmacies due to pharmacy/plan contracts, or as the patient moves through the different stages of their insurance plan.     Reyes Sharps, CPHT Pharmacy Technician III Certified Patient Advocate [facility] Patient Advocate Team Direct Number: [phone]  Fax: [phone]

## 2024-02-13 NOTE — Progress Notes (Unsigned)
 Enrolled patient for a 14 day Zio XT  monitor to be mailed to patients home

## 2024-02-13 NOTE — Telephone Encounter (Addendum)
 Patient Product/process development scientist completed.    The patient is insured through [employer]. Patient has Medicare and is not eligible for a copay card, but may be able to apply for patient assistance or Medicare RX Payment Plan (Patient Must reach out to their plan, if eligible for payment plan), if available.    Ran test claim for sacubitril -valsartan  (Entresto ) 24-26 mg and the current 30 day co-pay is $0.00.  Ran test claim for Farxiga 10 mg and the current 30 day co-pay is $0.00.  Ran test claim for Jardiance 10 mg and the current 30 day co-pay is $0.00.  Ran test claim for isosorbide -hydralazine  (Bidil ) 20-37.5 mg and the current 30 day co-pay is $0.00.  This test claim was processed through [facility]- copay amounts may vary at other pharmacies due to pharmacy/plan contracts, or as the patient moves through the different stages of their insurance plan.     Reyes Sharps, CPHT Pharmacy Technician III Certified Patient Advocate [facility] Patient Advocate Team Direct Number: [phone]  Fax: [phone]

## 2024-02-13 NOTE — Discharge Instructions (Addendum)
 To [facility] or their caretakers,  You were recently admitted to [facility] for COPD exacerbation likely caused from continued smoking.  You were started on new nebulizers at the recommendations of pulmonology (lung specialists).  You also received antibiotics due to the inflammation in your lungs.  During your hospital stay, you experienced an abnormal heart rate/rhythm called supraventricular tachycardia (SVT).  This was likely due to your ongoing lung disease and some of the nebulizers that we are giving you.  We have ordered a BiPAP machine for you.  Please use this every night while sleeping.  In addition, we have ordered new nebulizers for you to take at home.  You also have a follow-up appointment with the lung doctors (Dr. Darlean) on 03/09/2024.  You will also receive a heart monitor that will capture any abnormal heart rates/rhythms while you are at home.  You have a follow-up appointment scheduled for 03/13/2024 with cardiology (heart specialists).  Continue taking your home medications with the following changes:  Start taking Bisoprolol  (Zebeta ) 5 mg daily Arformoterol  (Brovana ) nebulizer - take 2 mL (15 mcg total) 2 times daily Budesonide  (Pulmicort ) 0.5 mg / 2 mL nebulizer - take 2 mL (0.5 mg total) 2 times daily Ipratropium-albuterol  (DuoNeb) - take 3 mL by nebulization every 6 hours Isosorbide -hydralazine  (BiDil ) 20-37.5 mg - take 1 tablet by mouth 3 times daily Sacubitril -valsartan  (Entresto ) 24-26 mg - take 1 tablet by mouth 2 times daily Stop taking Hydralazine  (Apresoline ) 25 mg Isosorbide  mononitrate (Imdur ) 30 mg Metoprolol  succinate (Toprol -XL) 50 mg Continue taking Albuterol  (Ventolin  HFA) 108 (90 base) MCG/ACT inhaler spacer-6.  Inhale 2 puffs into the lungs every 6 hours as needed for wheezing or shortness of breath Albuterol  (Proventil ) (2.5 mg / 3 mL) 0.083% nebulizer solution - take 3 mL (2.5 mL mg total) by nebulization every 2 hours as needed for wheezing or  shortness of breath Aspirin  EC 81 mg daily Atorvastatin  (Lipitor ) 80 mg daily Budesonide -glycopyrrolate -formoterol  (Breztri  Aerosphere) 160-9-4 0.8 mcg/ACT Aero inhaler - 2 puffs in the morning and then 2 puffs 12 hours later Duloxetine  (Cymbalta ) 60 mg Ezetimibe  (Zetia ) 10 mg daily Ferrous sulfate  (FeroSul) 35 (65 Fe) MG take 1 tablet by mouth 2 times daily with a meal Folic acid  (Folvite ) 1 mg daily Furosemide  (Lasix ) 40 mg by mouth 2 times daily Gabapentin  (Neurontin ) 300 mg by mouth at bedtime Nitroglycerin  (Nitrostat ) 0.4 mg sublingual tablet every 5 minutes as needed for chest pain Oxygen  - inhaled 3 L/min into the lungs continuously Pantoprazole  (Protonix ) 40 mg daily Ergocalciferol  (Drisdol ) 1.25 mg (50,000 unit) 1 capsule weekly Rivaroxaban  (Xarelto ) 20 mg by mouth daily with supper   You should seek further medical care if you experience worsening shortness of breath, chest pain, fever, or coughing up blood.  Please follow up with the following doctors/specialties: Pulmonology - you have an appointment with Dr. Darlean on 03/09/2024 Cardiology - you also have a follow-up appointment with cardiology on 03/13/2024  We recommend that you also see your primary care doctor in about a week to make sure that you continue to improve. We are so glad that you are feeling better.  Sincerely,  [facility]

## 2024-02-13 NOTE — TOC Progression Note (Signed)
 Transition of Care [facility]) - Progression Note    Patient Details  Name: Jared [name] MRN: [id number] Date of Birth: [birth year]  Transition of Care [facility]) CM/SW Contact  Waddell Barnie Rama, RN Phone Number: 02/13/2024, 3:11 PM  Clinical Narrative:    NCM informed Mitch with [employer] that patient will be dc today, he will call the resp rep and get an educational apt set up with patient and his sister here at the hospital prior to dc.    Also the patient was stating the portable that he has when he goes to the doctors office is not continuous it is on pulse, and he needs the continuous one, Mitch with [employer] states he has to speak to his manager about that because the one 's they have for that , they let the people who are traveling use those, he will have to see if he can get one for him.  Thomasina will make a service call for that , so he will either get a portable continuous or a tank, but he will get one .     Expected Discharge Plan: Home/Self Care Barriers to Discharge: Continued Medical Work up               Expected Discharge Plan and Services In-house Referral: NA Discharge Planning Services: CM Consult Post Acute Care Choice: NA Living arrangements for the past 2 months: Boarding House                 DME Arranged: N/A DME Agency: NA       HH Arranged: NA           Social Drivers of Health (SDOH) Interventions SDOH Screenings   Food Insecurity: No Food Insecurity (02/09/2024)  Housing: Low Risk  (02/09/2024)  Transportation Needs: No Transportation Needs (02/09/2024)  Utilities: Not At Risk (02/09/2024)  Alcohol Screen: Low Risk  (06/10/2023)  Depression (PHQ2-9): Low Risk  (02/07/2024)  Financial Resource Strain: Low Risk  (06/10/2023)  Physical Activity: Insufficiently Active (06/10/2023)  Social Connections: Moderately Isolated (02/09/2024)  Stress: No Stress Concern Present (06/10/2023)  Tobacco Use: High Risk (02/09/2024)  Health Literacy: Adequate Health  Literacy (06/10/2023)    Readmission Risk Interventions    02/11/2024    3:54 PM 12/27/2022    2:20 PM 12/14/2021    1:38 PM  Readmission Risk Prevention Plan  Post Dischage Appt  Complete Complete  Medication Screening  Complete Complete  Transportation Screening Complete Complete Complete  PCP or Specialist Appt within 3-5 Days Complete    HRI or Home Care Consult Complete    Palliative Care Screening Not Applicable    Medication Review (RN Care Manager) Complete

## 2024-02-13 NOTE — TOC Transition Note (Signed)
 Transition of Care [facility]) - Discharge Note   Patient Details  Name: Jared [name] MRN: [id number] Date of Birth: 07-22-1955  Transition of Care [facility]) CM/SW Contact:  Waddell Barnie Rama, RN Phone Number: 02/13/2024, 3:54 PM   Clinical Narrative:    Resp therapyist will be here at 5 pm to go over NIV (bipap)  education with patient and his sister.       Barriers to Discharge: Continued Medical Work up   Patient Goals and CMS Choice Patient states their goals for this hospitalization and ongoing recovery are:: return to boarding room   Choice offered to / list presented to : NA      Discharge Placement                       Discharge Plan and Services Additional resources added to the After Visit Summary for   In-house Referral: NA Discharge Planning Services: CM Consult Post Acute Care Choice: NA          DME Arranged: N/A DME Agency: NA       HH Arranged: NA          Social Drivers of Health (SDOH) Interventions SDOH Screenings   Food Insecurity: No Food Insecurity (02/09/2024)  Housing: Low Risk  (02/09/2024)  Transportation Needs: No Transportation Needs (02/09/2024)  Utilities: Not At Risk (02/09/2024)  Alcohol Screen: Low Risk  (06/10/2023)  Depression (PHQ2-9): Low Risk  (02/07/2024)  Financial Resource Strain: Low Risk  (06/10/2023)  Physical Activity: Insufficiently Active (06/10/2023)  Social Connections: Moderately Isolated (02/09/2024)  Stress: No Stress Concern Present (06/10/2023)  Tobacco Use: High Risk (02/09/2024)  Health Literacy: Adequate Health Literacy (06/10/2023)     Readmission Risk Interventions    02/11/2024    3:54 PM 12/27/2022    2:20 PM 12/14/2021    1:38 PM  Readmission Risk Prevention Plan  Post Dischage Appt  Complete Complete  Medication Screening  Complete Complete  Transportation Screening Complete Complete Complete  PCP or Specialist Appt within 3-5 Days Complete    HRI or Home Care Consult Complete    Palliative  Care Screening Not Applicable    Medication Review (RN Care Manager) Complete

## 2024-02-13 NOTE — Discharge Summary (Signed)
 Name: Jared [name] MRN: [id number] DOB: 1956-02-23 68 y.o. PCP: Delbert Clam, MD  Date of Admission: 02/09/2024  5:59 AM Date of Discharge: 02/13/2024  Attending Physician: Dr. Dayton Eastern  Discharge Diagnosis: Principal Problem:   COPD with acute exacerbation [facility]) Active Problems:   CAD (coronary artery disease)   Cigarette smoker   Type 2 diabetes mellitus (HCC)   Chronic respiratory failure with hypoxia (HCC)   Iron  deficiency anemia due to chronic blood loss   Peripheral vascular disease (HCC)   PAF (paroxysmal atrial fibrillation) (HCC)   Heart failure with improved ejection fraction (HFimpEF) (HCC)   Hypercarbia   SVT (supraventricular tachycardia) (HCC)    Discharge Medications: Allergies as of 02/13/2024       Reactions   Lisinopril  Cough        Medication List     STOP taking these medications    hydrALAZINE  25 MG tablet Commonly known as: APRESOLINE    isosorbide  mononitrate 30 MG 24 hr tablet Commonly known as: IMDUR    metoprolol  succinate 50 MG 24 hr tablet Commonly known as: TOPROL -XL       TAKE these medications    albuterol  108 (90 Base) MCG/ACT inhaler Commonly known as: Ventolin  HFA INHALE 2 PUFFS INTO THE LUNGS EVERY 6 (SIX) HOURS AS NEEDED FOR WHEEZING OR SHORTNESS OF BREATH.   albuterol  (2.5 MG/3ML) 0.083% nebulizer solution Commonly known as: PROVENTIL  Take 3 mLs (2.5 mg total) by nebulization every 2 (two) hours as needed for wheezing or shortness of breath.   aspirin  EC 81 MG tablet Take 81 mg by mouth daily. Swallow whole.   atorvastatin  80 MG tablet Commonly known as: LIPITOR  Take 1 tablet (80 mg total) by mouth daily.   bisoprolol  5 MG tablet Commonly known as: ZEBETA  Take 1 tablet (5 mg total) by mouth daily. Start taking on: February 14, 2024   Breztri  Aerosphere 160-9-4.8 MCG/ACT Aero inhaler Generic drug: budesonide -glycopyrrolate -formoterol  Take 2 puffs first thing in morning and then another 2 puffs about 12  hours later.   budesonide  0.5 MG/2ML nebulizer solution Commonly known as: PULMICORT  Take 2 mLs (0.5 mg total) by nebulization 2 (two) times daily.   DULoxetine  60 MG capsule Commonly known as: Cymbalta  Take 1 capsule (60 mg total) by mouth daily. For chronic leg pains   ezetimibe  10 MG tablet Commonly known as: ZETIA  Take 1 tablet (10 mg total) by mouth daily.   FeroSul 325 (65 Fe) MG tablet Generic drug: ferrous sulfate  Take 1 tablet (325 mg total) by mouth 2 (two) times daily with a meal.   folic acid  1 MG tablet Commonly known as: FOLVITE  Take 1 tablet (1 mg total) by mouth daily.   furosemide  40 MG tablet Commonly known as: LASIX  Take 1 tablet (40 mg total) by mouth 2 (two) times daily.   gabapentin  300 MG capsule Commonly known as: NEURONTIN  Take 1 capsule (300 mg total) by mouth at bedtime. For leg pains   ipratropium-albuterol  0.5-2.5 (3) MG/3ML Soln Commonly known as: DUONEB Take 3 mLs by nebulization every 6 (six) hours.   isosorbide -hydrALAZINE  20-37.5 MG tablet Commonly known as: BIDIL  Take 1 tablet by mouth 3 (three) times daily. Start taking on: February 14, 2024   nitroGLYCERIN  0.4 MG SL tablet Commonly known as: NITROSTAT  Place 0.4 mg under the tongue every 5 (five) minutes as needed for chest pain.   OXYGEN  Inhale 2 L/min into the lungs continuous. What changed: how much to take   pantoprazole  40 MG tablet Commonly known as:  Protonix  Take 1 tablet (40 mg total) by mouth daily.   sacubitril -valsartan  24-26 MG Commonly known as: ENTRESTO  Take 1 tablet by mouth 2 (two) times daily.   Vitamin D  (Ergocalciferol ) 1.25 MG (50000 UNIT) Caps capsule Commonly known as: Drisdol  Take 1 capsule (50,000 Units total) by mouth once a week.   Xarelto  20 MG Tabs tablet Generic drug: rivaroxaban  Take 1 tablet (20 mg total) by mouth daily with supper.        Disposition and follow-up:   [name] was discharged from [facility]  in Good condition.  At the hospital follow up visit please address:  1.  Follow-up:  a.  COPD exacerbation - the patient was recently treated for COPD exacerbation likely in the setting of continued smoking.  We started him on new nebulizer treatments and ordered him a BiPAP machine which he is to use at night.  He has a follow-up appointment with Dr. Darlean already scheduled.    b.  Paroxysmal SVT - the patient had episodes of SVT during this admission, likely in the setting of LABA nebulizers and potentially multifocal atrial tachycardia.  We switched his metoprolol  to bisoprolol  at the recommendations of her cardiologist.  We initially discontinued his LABA nebulizers, but our pulmonologist do not think this was a contributory factor.  He was discharged with cardiac monitoring to capture any further SVT episodes.  He has a follow-up appointment scheduled cardiology.   c.  CAD - the patient was complaining of chest tightness and had elevated troponins without EKG changes.  We continue to send medications please ensure the patient is continuing his home medications.   d. HFmrEF - patient was complaining of shortness of breath but was euvolemic.  We thought this was more so in the setting of COPD exacerbation.  New TTE showing LVEF of 45% with global hypokinesis.  We added Entresto  and switched his hydralazine  and Imdur  to BiDil  at the recommendations of her cardiologist.  Please ensure that the patient is taking his medications.  2.  Labs / imaging needed at time of follow-up: None  3.  Pending labs/ test needing follow-up: None  Follow-up Appointments:  Follow-up Information     [facility]  Follow up.   Why: NIV Contact information: [address] [place] [address] [phone]                 Hospital Course by problem list: #Acute hypoxic respiratory failure #COPD Exacerbation #Compensated Respiratory Acidosis Patient presented to ED with acute shortness of  breath and wheezing. He is a patient of Dr. Darlean at [facility]. Currently on Breztri  inhaler with Albuterol  inhaler/nebulizer for his COPD. Baseline oxygen  is 2-3 L nasal cannula. He was afebrile but tachypneic and tachycardic. Exam showing mild tachycardia with diffuse wheezing upon expiration in all lung fields. Initial VBG showing pCO2 of 71.5 with a bicarb of 41.7. Chest x-ray showing interstitial prominence concerning for pulmonary vascular congestion versus early CHF. In the ED the patient was started on BiPAP and given nebulizer treatments with improvement of his symptoms. Patient was euvolemic on exam without concerns for encephalopathy, more concerning for COPD exacerbation likely caused from continued smoking.  RVP including COVID/flu was negative.  He received a 5-day course of steroids as well as antibiotics including IV ceftriaxone .  Azithromycin  was unable to be used due to prolonged QT.  He clinically improved with these treatments but pCO2 on VBG's continue to rise so pulmonology was consulted.  They  recommended discontinuing BiPAP during the day as patient is a chronic CO2 retainer. They readjusted his nebulizer treatments for discharge.  Before discharge, he was ambulated with PT with oxygen  saturation dropping only to 87%.  This recovered after rest.  Upon discharge, I counseled the patient on tobacco cessation. Patient's chronic respiratory failure due to COPD is life threatening.  Previous ABG's have documented high PCO2 and spirometry reveals severe obstructive ventilatory defect.  Patient would benefit from non-invasive ventilation.  Without this therapy, the patient is at high risk of ending up with worsening symptoms, worsened respiratory failure, need for ER visits and/or recurrent hospitalizations.  Bilevel device unable to adequately support patient's nocturnal ventilation needs.  Patient would benefit from BiPAP therapy with set tidal volumes and pressure. Patient continues to  exhibit signs of hypercapnia associated with chronic respiratory failures secondary to severe COPD. Patient requires the use of NIV both QHS and daytime to help with exacerbation periods. The use of the NIV will treat patient's high PCO2 levels and can reduce risk of exacerbations and future hospitalizations when used at nigh and during the day. Patient will need this advanced settings in conjunction with her current medication regimen.  #Paroxysmal supraventricular Tachycardia # History of PAF Mr. Gathright' admission was complicated by episodes of SVT with rates up to 160s.  This initially resolved without intervention, however in the days leading up to discharge he would persistently go into SVT for 10 to 15 seconds at a time before dropping down.  He was asymptomatic during this time he did not experience any increased shortness of breath, chest pain, lightheadedness.  He was unable to tell that his heart was beating abnormally fast.  This was likely in the setting of multifocal atrial tachycardia or from nebulizer treatments.  We initially discontinued his beta agonist nebulizer treatments.  Cardiology was consulted for assistance and recommended switching home metoprolol  succinate to bisoprolol .  After making this change the patient did not have any further episodes of SVT.  We continued his Xarelto  20 mg.  #CAD Patient has history of CAD on cath in 2012 and presented to the ED with chest tightness that comes and goes.  He localizes to the right side of the chest initially but this moved.  Initial troponin was elevated to 52->64 but came back down to 38.  EKG without ST elevations or depressions.  Chest tightness was likely related to to COPD exacerbation.  Troponins elevated likely in the setting of demand ischemia.  No concern for ACS during this admission.  We continued him on his home medications of atorvastatin  80 mg daily, Zetia  10 mg daily, aspirin  81 mg daily.  He had nitroglycerin  sublingual as a  as needed but never used this.  #HFmrEF Patient presented with acute on chronic SOB. He was euvolemic but BNP was initially elevated to 462.  New echocardiogram during this admission showing drop in LVEF to 45% with global hypokinesis.  Normal RV function, mildly enlarged, small pericardial effusion.  As above, switched metoprolol  to bisoprolol  5 mg daily.  Switched him from hydralazine  25 mg 3 times daily and Imdur  30 mg daily to BiDil .  Also added Entresto  24/26 at the recommendations of cardiology.   #Hypertension Patient initially presented with elevated blood pressures upon admission up to 160s/110s. We restarted him on his home medications including hydralazine , metoprolol  succinate, and Imdur .  Upon discharge he was switched to medications as above.   #Iron  Deficiency Anemia Hemoglobin on admission was 12.0 and trended slightly  down to 10.6 on day of discharge.  There were no signs or symptoms of bleeding during this admission. Has history of anemia due to chronic blood loss. GI previously performed EGD which showed esophagitis. Prior enteroscopy showed small bowel angioectasias which were treated with APC. He follows with hematology and is on frequent IV iron  supplementation, last on 01/23/24.   #Enhanced physiologic tremor Patient presenting with tremors and shaking of all extremities.  He states that this only happens when he comes to the hospital.  This is likely in the setting of a physiologic tremor which is enhanced by long-acting beta agonist inhalers/nebulizers.  #Prolonged QTc on EKG Patient was previously in SVT on EKG, but back in NSR. Both EKGs he obtained in the ED showed a prolonged QT.  Qtc came down on repeat EKGs.  We held all QT prolonging medications during this admission.   #PVD History of peripheral vascular disease in the left lower extremity. Vascular surgery performed catheterization on 08/02/2023 which showed atherosclerosis and recommended Aspirin , Plavix , Eliquis,  and statin with 4-week follow-up for ABI and duplex. The patient had symptomatic anemia with Plavix  at that time, so this was discontinued. We continued his home medications. Ensure proper outpatient follow-up with vascular surgery. -Continue Atorvastatin  80 mg -Continue Aspirin  81 mg -Continue Xarelto  20 mg   #T2DM The patient has a reported history of T2DM with last A1c of 5.4% on 06/10/23. It appears to be well-controlled without anti-hypgerglycemic medications. Glucoses were elevated but not to the degree that SSI was warranted.   #Tobacco use Disorder Patient reports smoking despite oxygen  use despite history of COPD which is a likely cause of his COPD exacerbation. We considered using nicotine  patches but the patient did not want this.  Counseled the patient on tobacco cessation before discharge.   #GERD #Esophagitis No acute concerns during this hospitalization. We continued his home pantoprazole .  #HLD Continued his home Zetia  and Lipitor .    #Peripheral Neropathy No acute complaints during this hospitalization. We continued his home gabapentin  300mg  bid.   Discharge Subjective: [name] reports feeling ready for discharge.  His shortness of breath is improved and his tightness is only mild.  He is okay with any medication changes and states that he will try to stop smoking.  He understands that BiPAP is to only be used at night while sleeping. Patient is medically ready for discharge.  Discharge Exam:   BP (!) 149/81 (BP Location: Right Arm)   Pulse 80   Temp 98.4 F (36.9 C) (Oral)   Resp 20   Ht 6' 1 (1.854 m)   Wt 92.2 kg   SpO2 100%   BMI 26.82 kg/m  Physical Exam: Constitutional: well-appearing, well-nourished, in no acute distress HENT: normocephalic atraumatic, mucous membranes moist Eyes: conjunctiva non-erythematous, PERRL, no scleral icterus Neck: supple without lesions, thyroid  non-enlarged and non-tender Cardiovascular: regular rate and rhythm, no  m/r/g Pulmonary/Chest: normal work of breathing on 3L [place], lungs with minimal wheezing Abdominal: soft, non-tender, non-distended, bowel sounds normal MSK: normal bulk and tone Neurological: alert & oriented x3 Skin: warm and dry Extremities: no edema or cyanosis; peripheral pulses intact Psych: normal mood and affect, thought content normal.  Normal judgment.   Pertinent Labs, Studies, and Procedures:     Latest Ref Rng & Units 02/13/2024    2:18 AM 02/12/2024    2:17 AM 02/11/2024    2:59 AM  CBC  WBC 4.0 - 10.5 K/uL 8.0  8.2  10.9   Hemoglobin  13.0 - 17.0 g/dL 89.3  89.2  89.2   Hematocrit 39.0 - 52.0 % 39.3  38.9  39.2   Platelets 150 - 400 K/uL 214  211  212        Latest Ref Rng & Units 02/13/2024    2:18 AM 02/12/2024    2:17 AM 02/11/2024    2:59 AM  CMP  Glucose 70 - 99 mg/dL 866  871  866   BUN 8 - 23 mg/dL 15  14  14    Creatinine 0.61 - 1.24 mg/dL 9.22  9.34  9.23   Sodium 135 - 145 mmol/L 145  144  144   Potassium 3.5 - 5.1 mmol/L 4.3  3.8  3.7   Chloride 98 - 111 mmol/L 96  94  94   CO2 22 - 32 mmol/L 44  41  38   Calcium  8.9 - 10.3 mg/dL 8.7  8.8  8.8     ECHOCARDIOGRAM COMPLETE Result Date: 02/10/2024    ECHOCARDIOGRAM REPORT   Patient Name:   Jared [name] Date of Exam: 02/10/2024 Medical Rec #:  [id number]    Height:       73.0 in Accession #:    [id number]   Weight:       200.0 lb Date of Birth:  09/18/1955     BSA:          2.152 m Patient Age:    68 years     BP:           132/78 mmHg Patient Gender: M            HR:           72 bpm. Exam Location:  Inpatient Procedure: 2D Echo, Cardiac Doppler and Color Doppler (Both Spectral and Color            Flow Doppler were utilized during procedure). Indications:    Dyspnea R06.00  History:        Patient has prior history of Echocardiogram examinations, most                 recent 05/02/2023. CHF and Cardiomyopathy, CAD, COPD,                 Arrythmias:Atrial Fibrillation, Atrial Flutter and Tachycardia,                  Signs/Symptoms:Syncope; Risk Factors:Hypertension, Diabetes and                 Current Smoker.  Sonographer:    Thea Norlander RCS Referring Phys: [id number] GRACE LAU IMPRESSIONS  1. Left ventricular ejection fraction, by estimation, is 45%. The left ventricle has mildly decreased function. The left ventricle demonstrates global hypokinesis. The left ventricular internal cavity size was mildly dilated. There is mild eccentric left ventricular hypertrophy. Left ventricular diastolic parameters are consistent with Grade II diastolic dysfunction (pseudonormalization). Elevated left atrial pressure.  2. Right ventricular systolic function is normal. The right ventricular size is mildly enlarged. Tricuspid regurgitation signal is inadequate for assessing PA pressure.  3. Left atrial size was moderately dilated.  4. Right atrial size was moderately dilated.  5. A small pericardial effusion is present. The pericardial effusion is circumferential. There is no evidence of cardiac tamponade.  6. The mitral valve is normal in structure. Trivial mitral valve regurgitation. No evidence of mitral stenosis.  7. The aortic valve is tricuspid. There is mild calcification of the aortic valve. There is mild thickening of  the aortic valve. Aortic valve regurgitation is not visualized. Aortic valve sclerosis/calcification is present, without any evidence of aortic stenosis.  8. Aortic dilatation noted. There is borderline dilatation of the aortic root, measuring 38 mm. There is mild dilatation of the ascending aorta, measuring 44 mm.  9. The inferior vena cava is dilated in size with <50% respiratory variability, suggesting right atrial pressure of 15 mmHg. Comparison(s): No significant change from prior study. Prior images reviewed side by side. The left ventricular function is unchanged. Left ventricular systolic function appears to be overestimated on the 05/02/2023 study, but is similar to the 12/13/2021 report. FINDINGS  Left  Ventricle: Left ventricular ejection fraction, by estimation, is 45%. The left ventricle has mildly decreased function. The left ventricle demonstrates global hypokinesis. The left ventricular internal cavity size was mildly dilated. There is mild eccentric left ventricular hypertrophy. Left ventricular diastolic function could not be evaluated due to atrial fibrillation. Left ventricular diastolic parameters are consistent with Grade II diastolic dysfunction (pseudonormalization). Elevated left atrial pressure. Right Ventricle: The right ventricular size is mildly enlarged. No increase in right ventricular wall thickness. Right ventricular systolic function is normal. Tricuspid regurgitation signal is inadequate for assessing PA pressure. Left Atrium: Left atrial size was moderately dilated. Right Atrium: Right atrial size was moderately dilated. Pericardium: A small pericardial effusion is present. The pericardial effusion is circumferential. There is no evidence of cardiac tamponade. Mitral Valve: The mitral valve is normal in structure. Mild mitral annular calcification. Trivial mitral valve regurgitation. No evidence of mitral valve stenosis. Tricuspid Valve: The tricuspid valve is normal in structure. Tricuspid valve regurgitation is not demonstrated. Aortic Valve: The aortic valve is tricuspid. There is mild calcification of the aortic valve. There is mild thickening of the aortic valve. Aortic valve regurgitation is not visualized. Aortic valve sclerosis/calcification is present, without any evidence of aortic stenosis. Aortic valve peak gradient measures 9.7 mmHg. Pulmonic Valve: The pulmonic valve was normal in structure. Pulmonic valve regurgitation is not visualized. No evidence of pulmonic stenosis. Aorta: Aortic dilatation noted. There is borderline dilatation of the aortic root, measuring 38 mm. There is mild dilatation of the ascending aorta, measuring 44 mm. Venous: The inferior vena cava is dilated  in size with less than 50% respiratory variability, suggesting right atrial pressure of 15 mmHg. IAS/Shunts: No atrial level shunt detected by color flow Doppler.  LEFT VENTRICLE PLAX 2D LVIDd:         5.80 cm      Diastology LVIDs:         4.60 cm      LV e' medial:    6.45 cm/s LV PW:         1.15 cm      LV E/e' medial:  19.4 LV IVS:        1.15 cm      LV e' lateral:   7.73 cm/s LVOT diam:     2.00 cm      LV E/e' lateral: 16.2 LV SV:         64 LV SV Index:   30 LVOT Area:     3.14 cm  LV Volumes (MOD) LV vol d, MOD A2C: 121.0 ml LV vol d, MOD A4C: 115.0 ml LV vol s, MOD A2C: 80.7 ml LV vol s, MOD A4C: 77.1 ml LV SV MOD A2C:     40.3 ml LV SV MOD A4C:     43.9 ml LV SV MOD BP:  39.9 ml RIGHT VENTRICLE             IVC RV S prime:     16.00 cm/s  IVC diam: 3.30 cm TAPSE (M-mode): 2.5 cm LEFT ATRIUM             Index        RIGHT ATRIUM           Index LA diam:        4.70 cm 2.18 cm/m   RA Area:     28.60 cm LA Vol (A2C):   85.4 ml 39.69 ml/m  RA Volume:   101.50 ml 47.17 ml/m LA Vol (A4C):   79.6 ml 37.00 ml/m LA Biplane Vol: 79.1 ml 36.76 ml/m  AORTIC VALVE AV Area (Vmax): 2.25 cm AV Vmax:        156.00 cm/s AV Peak Grad:   9.7 mmHg LVOT Vmax:      111.67 cm/s LVOT Vmean:     70.200 cm/s LVOT VTI:       0.205 m  AORTA Ao Root diam: 3.80 cm Ao Asc diam:  4.40 cm MITRAL VALVE MV Area (PHT): 5.27 cm     SHUNTS MV Decel Time: 144 msec     Systemic VTI:  0.20 m MV E velocity: 125.00 cm/s  Systemic Diam: 2.00 cm MV A velocity: 41.90 cm/s MV E/A ratio:  2.98 Mihai Croitoru MD Electronically signed by Jerel Balding MD Signature Date/Time: 02/10/2024/4:58:48 PM    Final    DG Chest Port 1 View Result Date: 02/09/2024 EXAM: 1 VIEW XRAY OF THE CHEST 02/09/2024 06:26:00 AM COMPARISON: 11/13/2023 CLINICAL HISTORY: sob. shob FINDINGS: LUNGS AND PLEURA: Mild interstitial prominence. No focal pulmonary opacity. No pleural effusion. No pneumothorax. HEART AND MEDIASTINUM: Stable cardiomegaly. Aortic  atherosclerosis. BONES AND SOFT TISSUES: No acute osseous abnormality. IMPRESSION: 1. Mild interstitial prominence likely due to pulmonary vascular congestion or early congestive heart failure . 2. Stable cardiomegaly. 3. Aortic atherosclerosis. Electronically signed by: Waddell Calk MD 02/09/2024 06:41 AM EDT RP Workstation: [id number]     Discharge Instructions:   Discharge Instructions      To [facility] or their caretakers,  You were recently admitted to [facility] for COPD exacerbation likely caused from continued smoking.  You were started on new nebulizers at the recommendations of pulmonology (lung specialists).  You also received antibiotics due to the inflammation in your lungs.  During your hospital stay, you experienced an abnormal heart rate/rhythm called supraventricular tachycardia (SVT).  This was likely due to your ongoing lung disease and some of the nebulizers that we are giving you.  We have ordered a BiPAP machine for you.  Please use this every night while sleeping.  In addition, we have ordered new nebulizers for you to take at home.  You also have a follow-up appointment with the lung doctors (Dr. Darlean) on 03/09/2024.  You will also receive a heart monitor that will capture any abnormal heart rates/rhythms while you are at home.  You have a follow-up appointment scheduled for 03/13/2024 with cardiology (heart specialists).  Continue taking your home medications with the following changes:  Start taking Bisoprolol  (Zebeta ) 5 mg daily Arformoterol  (Brovana ) nebulizer - take 2 mL (15 mcg total) 2 times daily Budesonide  (Pulmicort ) 0.5 mg / 2 mL nebulizer - take 2 mL (0.5 mg total) 2 times daily Ipratropium-albuterol  (DuoNeb) - take 3 mL by nebulization every 6 hours Isosorbide -hydralazine  (BiDil ) 20-37.5 mg - take 1 tablet by mouth 3  times daily Sacubitril -valsartan  (Entresto ) 24-26 mg - take 1 tablet by mouth 2 times daily Stop taking Hydralazine  (Apresoline ) 25  mg Isosorbide  mononitrate (Imdur ) 30 mg Metoprolol  succinate (Toprol -XL) 50 mg Continue taking Albuterol  (Ventolin  HFA) 108 (90 base) MCG/ACT inhaler spacer-6.  Inhale 2 puffs into the lungs every 6 hours as needed for wheezing or shortness of breath Albuterol  (Proventil ) (2.5 mg / 3 mL) 0.083% nebulizer solution - take 3 mL (2.5 mL mg total) by nebulization every 2 hours as needed for wheezing or shortness of breath Aspirin  EC 81 mg daily Atorvastatin  (Lipitor ) 80 mg daily Budesonide -glycopyrrolate -formoterol  (Breztri  Aerosphere) 160-9-4 0.8 mcg/ACT Aero inhaler - 2 puffs in the morning and then 2 puffs 12 hours later Duloxetine  (Cymbalta ) 60 mg Ezetimibe  (Zetia ) 10 mg daily Ferrous sulfate  (FeroSul) 35 (65 Fe) MG take 1 tablet by mouth 2 times daily with a meal Folic acid  (Folvite ) 1 mg daily Furosemide  (Lasix ) 40 mg by mouth 2 times daily Gabapentin  (Neurontin ) 300 mg by mouth at bedtime Nitroglycerin  (Nitrostat ) 0.4 mg sublingual tablet every 5 minutes as needed for chest pain Oxygen  - inhaled 3 L/min into the lungs continuously Pantoprazole  (Protonix ) 40 mg daily Ergocalciferol  (Drisdol ) 1.25 mg (50,000 unit) 1 capsule weekly Rivaroxaban  (Xarelto ) 20 mg by mouth daily with supper   You should seek further medical care if you experience worsening shortness of breath, chest pain, fever, or coughing up blood.  Please follow up with the following doctors/specialties: Pulmonology - you have an appointment with Dr. Darlean on 03/09/2024 Cardiology - you also have a follow-up appointment with cardiology on 03/13/2024  We recommend that you also see your primary care doctor in about a week to make sure that you continue to improve. We are so glad that you are feeling better.  Sincerely,  [facility]      Signed:  Letha Cheadle, MD Internal Medicine Resident, PGY-1 02/13/2024, 6:59 PM Please contact the on call pager after 5 pm and on weekends at [phone].

## 2024-02-14 ENCOUNTER — Other Ambulatory Visit (HOSPITAL_COMMUNITY): Payer: Self-pay | Admitting: Hematology

## 2024-02-14 ENCOUNTER — Telehealth: Payer: Self-pay | Admitting: *Deleted

## 2024-02-14 ENCOUNTER — Other Ambulatory Visit: Payer: Self-pay

## 2024-02-14 DIAGNOSIS — D649 Anemia, unspecified: Secondary | ICD-10-CM

## 2024-02-14 NOTE — Transitions of Care (Post Inpatient/ED Visit) (Signed)
 02/14/2024  Name: Jared [name] MRN: [id number] DOB: [birth year]  Today's TOC FU Call Status: Today's TOC FU Call Status:: Successful TOC FU Call Completed TOC FU Call Complete Date: 02/14/24 Patient's Name and Date of Birth confirmed.  Transition Care Management Follow-up Telephone Call Date of Discharge: 02/13/24 Discharge Facility: [facility]) Type of Discharge: Inpatient Admission Primary Inpatient Discharge Diagnosis:: COPD with acute exacerbation How have you been since you were released from the hospital?: Better Any questions or concerns?: No  Items Reviewed: Did you receive and understand the discharge instructions provided?: Yes Medications obtained,verified, and reconciled?: Yes (Medications Reviewed) Any new allergies since your discharge?: No Dietary orders reviewed?: Yes Type of Diet Ordered:: low sodium heart healthy Do you have support at home?: Yes People in Home [RPT]: alone Name of Support/Comfort Primary Source: Sister/Jacqualine [place]  Medications Reviewed Today: Medications Reviewed Today     Reviewed by Lucky Andrea LABOR, RN (Registered Nurse) on 02/14/24 at 1455  Med List Status: <None>   Medication Order Taking? Sig Documenting Provider Last Dose Status Informant  albuterol  (PROVENTIL ) (2.5 MG/3ML) 0.083% nebulizer solution [id number] Yes Take 3 mLs (2.5 mg total) by nebulization every 2 (two) hours as needed for wheezing or shortness of breath. Darlean Ozell NOVAK, MD  Active Self, Pharmacy Records  albuterol  (VENTOLIN  HFA) 108 [phone] Base) MCG/ACT inhaler [id number] Yes INHALE 2 PUFFS INTO THE LUNGS EVERY 6 (SIX) HOURS AS NEEDED FOR WHEEZING OR SHORTNESS OF BREATH. Newlin, Enobong, MD  Active Self, Pharmacy Records  aspirin  EC 81 MG tablet [id number] Yes Take 81 mg by mouth daily. Swallow whole. [provider]  Active Self, Pharmacy Records  atorvastatin  (LIPITOR ) 80 MG tablet [id number] Yes Take 1 tablet (80 mg total) by mouth daily. Newlin, Enobong, MD   Active Self, Pharmacy Records           Med Note MURRAY, KATAWBBA   Wed Dec 11, 2023  1:23 PM)    bisoprolol  (ZEBETA ) 5 MG tablet [id number] Yes Take 1 tablet (5 mg total) by mouth daily. Waymond Cart, MD  Active   budesonide  (PULMICORT ) 0.5 MG/2ML nebulizer solution [id number] Yes Take 2 mLs (0.5 mg total) by nebulization 2 (two) times daily. Waymond Cart, MD  Active   budesonide -glycopyrrolate -formoterol  (BREZTRI  AEROSPHERE) 160-9-4.8 MCG/ACT AERO inhaler [id number] Yes Take 2 puffs first thing in morning and then another 2 puffs about 12 hours later. Darlean Ozell NOVAK, MD  Active Pharmacy Records, Self  DULoxetine  (CYMBALTA ) 60 MG capsule [id number] Yes Take 1 capsule (60 mg total) by mouth daily. For chronic leg pains Delbert Clam, MD  Active Self, Pharmacy Records           Med Note MURRAY, KATAWBBA   Wed Dec 11, 2023  1:23 PM)    ergocalciferol  (DRISDOL ) 1.25 MG (50000 UT) capsule [id number] Yes Take 1 capsule (50,000 Units total) by mouth once a week. Newlin, Enobong, MD  Active Self, Pharmacy Records  ezetimibe  (ZETIA ) 10 MG tablet [id number] Yes Take 1 tablet (10 mg total) by mouth daily. Okey Vina GAILS, MD  Active Self, Pharmacy Records  ferrous sulfate  [facility]) 325 (65 FE) MG tablet [id number] Yes Take 1 tablet (325 mg total) by mouth 2 (two) times daily with a meal. Delbert Clam, MD  Active Self, Pharmacy Records  folic acid  (FOLVITE ) 1 MG tablet [id number] Yes Take 1 tablet (1 mg total) by mouth daily. Newlin, Enobong, MD  Active Self, Pharmacy Records  furosemide  (LASIX ) 40 MG tablet [id number] Yes Take 1 tablet (40  mg total) by mouth 2 (two) times daily. Newlin, Enobong, MD  Active Self, Pharmacy Records  gabapentin  (NEURONTIN ) 300 MG capsule [id number] Yes Take 1 capsule (300 mg total) by mouth at bedtime. For leg pains Delbert Clam, MD  Active Self, Pharmacy Records  ipratropium-albuterol  (DUONEB) 0.5-2.5 (3) MG/3ML SOLN [id number] Yes Take 3 mLs by nebulization every 6 (six) hours. Waymond Cart, MD  Active   isosorbide -hydrALAZINE  (BIDIL ) 20-37.5 MG tablet [id number] Yes Take 1 tablet by mouth 3 (three) times daily. Waymond Cart, MD  Active   nitroGLYCERIN  (NITROSTAT ) 0.4 MG SL tablet [id number] Yes Place 0.4 mg under the tongue every 5 (five) minutes as needed for chest pain. [provider]  Active Pharmacy Records, Self  OXYGEN  [id number] Yes Inhale 2 L/min into the lungs continuous.  Patient taking differently: Inhale 4 L/min into the lungs continuous.   [provider]  Active Self, Pharmacy Records  pantoprazole  (PROTONIX ) 40 MG tablet [id number] Yes Take 1 tablet (40 mg total) by mouth daily. Newlin, Enobong, MD  Active Self, Pharmacy Records  rivaroxaban  (XARELTO ) 20 MG TABS tablet [id number] Yes Take 1 tablet (20 mg total) by mouth daily with supper. Newlin, Enobong, MD  Active Self, Pharmacy Records  sacubitril -valsartan  (ENTRESTO ) 24-26 MG [id number] Yes Take 1 tablet by mouth 2 (two) times daily. Waymond Cart, MD  Active     Discontinued 04/04/20 1029             Home Care and Equipment/Supplies: Were Home Health Services Ordered?: No Any new equipment or medical supplies ordered?: Yes Name of Medical supply agency?: [employer]  for [facility] Were you able to get the equipment/medical supplies?: Yes Do you have any questions related to the use of the equipment/supplies?: No  Functional Questionnaire: Do you need assistance with bathing/showering or dressing?: Yes (NA assist M-F for 2-3 hours) Do you need assistance with meal preparation?: No Do you need assistance with eating?: No Do you have difficulty maintaining continence: No Do you need assistance with getting out of bed/getting out of a chair/moving?: No Do you have difficulty managing or taking your medications?: No  Follow up appointments reviewed: PCP Follow-up appointment confirmed?: Yes Date of PCP follow-up appointment?: 02/24/24 Follow-up Provider: Dr. Newlin Specialist [facility]  Follow-up appointment confirmed?: Yes Date of Specialist follow-up appointment?: 03/09/24 Follow-Up Specialty Provider:: Pulmonology on 03/09/24 and Cardiology on 03/13/24 Do you need transportation to your follow-up appointment?: No Do you understand care options if your condition(s) worsen?: Yes-patient verbalized understanding  SDOH Interventions Today    Flowsheet Row Most Recent Value  SDOH Interventions   Food Insecurity Interventions Intervention Not Indicated  Housing Interventions Intervention Not Indicated  Transportation Interventions Intervention Not Indicated  Utilities Interventions Intervention Not Indicated    Goals Addressed             This Visit's Progress    VBCI Transitions of Care (TOC) Care Plan       Problems:  Recent Hospitalization for treatment of COPD Medication management barrier Patient seems overwhelmed with medication management  Goal:  Over the next 30 days, the patient will not experience hospital readmission  Interventions:  Transitions of Care: Doctor Visits  - discussed the importance of doctor visits Post discharge activity limitations prescribed by provider reviewed Reviewed Signs and symptoms of infection Medication Review, discussed compliance packaging offered at [facility]  COPD Interventions: Advised patient to engage in light exercise as tolerated 3-5 days a week to aid in the the management of COPD  Advised patient to track and manage COPD triggers Advised patient to self assesses COPD action plan zone and make appointment with provider if in the yellow zone for 48 hours without improvement Assessed social determinant of health barriers Discussed the importance of adequate rest and management of fatigue with COPD Provided education about and advised patient to utilize infection prevention strategies to reduce risk of respiratory infection Provided instruction about proper use of medications used for management of COPD  including inhalers Use of home oxygen  Discussed use of new NIV(Bipap) patient used last night and notes feeling more rested today  Patient Self Care Activities:  Attend all scheduled provider appointments Call pharmacy for medication refills 3-7 days in advance of running out of medications Call provider office for new concerns or questions  Notify RN Care Manager of TOC call rescheduling needs Participate in Transition of Care Program/Attend TOC scheduled calls Take medications as prescribed   do breathing exercises every day eliminate symptom triggers at home keep follow-up appointments: PCP on 02/24/24, Pulmonology on 03/09/24 and Cardiology on 03/13/24 eat healthy/prescribed diet: low sodium, heart healthy do breathing exercises every day Use Bipap nightly  Plan:  Telephone follow up appointment with care management team member scheduled for:  02/21/24 at 11am        Andrea Dimes RN, BSN [place]  [facility] RN Care Manager [phone]

## 2024-02-14 NOTE — Transitions of Care (Post Inpatient/ED Visit) (Signed)
   02/14/2024  Name: Jared [name] MRN: [id number] DOB: 1956/02/21  Today's TOC FU Call Status: Today's TOC FU Call Status:: Unsuccessful Call (1st Attempt) Unsuccessful Call (1st Attempt) Date: 02/14/24  Attempted to reach the patient regarding the most recent Inpatient/ED visit.  [name] requested a call back later today.  Follow Up Plan: Additional outreach attempts will be made to reach the patient to complete the Transitions of Care (Post Inpatient/ED visit) call.   Andrea Dimes RN, BSN [place]  [facility] Health RN Care Manager [phone]

## 2024-02-17 ENCOUNTER — Telehealth: Payer: Self-pay | Admitting: Acute Care

## 2024-02-17 DIAGNOSIS — R911 Solitary pulmonary nodule: Secondary | ICD-10-CM

## 2024-02-17 DIAGNOSIS — Z87891 Personal history of nicotine dependence: Secondary | ICD-10-CM

## 2024-02-17 NOTE — Telephone Encounter (Signed)
 Call report:    IMPRESSION: Lung-RADS 4A, suspicious. Follow up low-dose chest CT without contrast in 3 months (please use the following order, CT CHEST LCS NODULE FOLLOW-UP W/O CM) is recommended. Right lower lobe density is favored to be infectious, especially given new mild dependent right middle lobe airspace disease. Consider antibiotic therapy prior to CT follow-up.   Cardiomegaly with new small pericardial effusion.   Pulmonary artery enlargement suggests pulmonary arterial hypertension.   Aortic atherosclerosis (ICD10-I70.0), coronary artery atherosclerosis and emphysema (ICD10-J43.9).   These results will be called to the ordering clinician or representative by the Radiologist Assistant, and communication documented in the PACS or [employer].     Electronically Signed   By: Rockey Kilts M.D.   On: 02/15/2024 17:35

## 2024-02-18 NOTE — Telephone Encounter (Signed)
 Spoke with patient and reviewed lung screening CT results from 02/04/24. Pt was hospitalized on 02/09/24 with COPD exacerbation and was treated with antibiotic and steroid therapy. Pt reports that he is feeling better and will follow up with ov with Dr Darlean on 03/09/24. Advised pt that we will plan to repeat his CT in 3 months to make sure things clear. Pt verbalized understanding and requests a call closer to 3 months to schedule CT. Results/ plans faxed to PCP. Order placed for 3 month nodule follow up CT.

## 2024-02-18 NOTE — Addendum Note (Signed)
 Addended by: ANITRA AQUAS D on: 02/18/2024 11:49 AM   Modules accepted: Orders

## 2024-02-21 ENCOUNTER — Other Ambulatory Visit: Payer: Self-pay | Admitting: *Deleted

## 2024-02-21 NOTE — Transitions of Care (Post Inpatient/ED Visit) (Signed)
 Transition of Care week 2  Visit Note  02/21/2024  Name: Jared [name] MRN: [id number]          DOB: [birth year]  Situation: Patient enrolled in [facility] 30-day program. Visit completed with [name] by telephone.   Background:   Initial Transition Care Management Follow-up Telephone Call    Past Medical History:  Diagnosis Date   Acute blood loss anemia 05/18/2023   Acute perforated appendicitis 04/30/2023   AKI (acute kidney injury) (HCC) 11/18/2020   Atrial fibrillation with RVR (HCC) 05/06/2023   AVM (arteriovenous malformation)    CAD (coronary artery disease)    a. LHC 5/12:  LAD 20, pLCx 20, pRCA 40, dRCA 40, EF 35%, diff HK  //  b. Myoview 4/16: Overall Impression:  High risk stress nuclear study There is no evidence of ischemia.  There is severe LV dysfunction. LV Ejection Fraction: 30%.  LV Wall Motion:  There is global LV hypokinesis.     CAP (community acquired pneumonia) 09/2013   Chronic combined systolic and diastolic CHF (congestive heart failure) (HCC)    a. Echo 4/16:Mild LVH, EF 40-45%, diffuse HK //  b. Echo 8/17: EF 35-40%, diffuse HK, diastolic dysfunction, aortic sclerosis, trivial MR, moderate LAE, normal RVSF, moderate RAE, mild TR, PASP 42 mmHg // c. Echo 4/18: Mild concentric LVH, EF 30-35, normal wall motion, grade 1 diastolic dysfunction, PASP 49   Chronic respiratory failure (HCC)    Cluster headache    hx; haven't had one in awhile (01/09/2016)   COPD (chronic obstructive pulmonary disease) (HCC)    thelbert 01/09/2016   DM (diabetes mellitus) (HCC)    History of CVA (cerebrovascular accident)    Hypertension    IDA (iron  deficiency anemia)    Moderate tobacco use disorder    NICM (nonischemic cardiomyopathy) (HCC)    Nicotine  addiction    Peripheral vascular disease (HCC)    Syncope 06/11/2019   Tobacco abuse    Type 2 diabetes mellitus (HCC) 05/14/2016    Assessment: Patient Reported Symptoms: Cognitive Cognitive Status: Able to follow simple  commands, Alert and oriented to person, place, and time, Normal speech and language skills      Neurological Neurological Review of Symptoms: Headaches Neurological Management Strategies: Medication therapy, Adequate rest, Routine screening Neurological Self-Management Outcome: 3 (uncertain) Neurological Comment: Reports headache in the mornings for about a month. Wearing BiPap night. Taking ibuprofen  for the pain  HEENT HEENT Symptoms Reported: Nasal discharge HEENT Management Strategies: Routine screening HEENT Self-Management Outcome: 3 (uncertain) HEENT Comment: Patient has discussed with his providers.    Cardiovascular Cardiovascular Symptoms Reported: No symptoms reported Does patient have uncontrolled Hypertension?: Yes Is patient checking Blood Pressure at home?: Yes Patient's Recent BP reading at home: BP yesterday 137/91 Cardiovascular Management Strategies: Routine screening, Medication therapy, Adequate rest, Diet modification Cardiovascular Self-Management Outcome: 4 (good) Cardiovascular Comment: Patient is waiting on heart monitor to be redelivered.  Respiratory Respiratory Symptoms Reported: Productive cough, Shortness of breath, Chest tightness Additional Respiratory Details: SOB and chest tightness improves with nebulizer Respiratory Management Strategies: BiPAP, Routine screening, Medication therapy, Oxygen  therapy, Coping strategies Respiratory Self-Management Outcome: 3 (uncertain) (Follow up with Pulmonology on 03/09/24)  Endocrine Endocrine Symptoms Reported: Not assessed    Gastrointestinal Gastrointestinal Symptoms Reported: No symptoms reported      Genitourinary Genitourinary Symptoms Reported: No symptoms reported    Integumentary Integumentary Symptoms Reported: Not assessed    Musculoskeletal Musculoskelatal Symptoms Reviewed: Limited mobility Musculoskeletal Self-Management Outcome: 4 (good) Musculoskeletal Comment: ambulates  with walker       Psychosocial Psychosocial Symptoms Reported: Not assessed         There were no vitals filed for this visit.  Medications Reviewed Today     Reviewed by Lucky Andrea LABOR, RN (Registered Nurse) on 02/21/24 at 1126  Med List Status: <None>   Medication Order Taking? Sig Documenting Provider Last Dose Status Informant  albuterol  (PROVENTIL ) (2.5 MG/3ML) 0.083% nebulizer solution [id number] Yes Take 3 mLs (2.5 mg total) by nebulization every 2 (two) hours as needed for wheezing or shortness of breath. Darlean Ozell NOVAK, MD  Active Self, Pharmacy Records  albuterol  (VENTOLIN  HFA) 108 [phone] Base) MCG/ACT inhaler [id number] Yes INHALE 2 PUFFS INTO THE LUNGS EVERY 6 (SIX) HOURS AS NEEDED FOR WHEEZING OR SHORTNESS OF BREATH. Newlin, Enobong, MD  Active Self, Pharmacy Records  aspirin  EC 81 MG tablet [id number] Yes Take 81 mg by mouth daily. Swallow whole. [provider]  Active Self, Pharmacy Records  atorvastatin  (LIPITOR ) 80 MG tablet [id number] Yes Take 1 tablet (80 mg total) by mouth daily. Newlin, Enobong, MD  Active Self, Pharmacy Records           Med Note MURRAY, KATAWBBA   Wed Dec 11, 2023  1:23 PM)    bisoprolol  (ZEBETA ) 5 MG tablet [id number] Yes Take 1 tablet (5 mg total) by mouth daily. Waymond Cart, MD  Active   budesonide  (PULMICORT ) 0.5 MG/2ML nebulizer solution [id number] Yes Take 2 mLs (0.5 mg total) by nebulization 2 (two) times daily. Waymond Cart, MD  Active   budesonide -glycopyrrolate -formoterol  (BREZTRI  AEROSPHERE) 160-9-4.8 MCG/ACT AERO inhaler [id number] Yes Take 2 puffs first thing in morning and then another 2 puffs about 12 hours later. Darlean Ozell NOVAK, MD  Active Pharmacy Records, Self  DULoxetine  (CYMBALTA ) 60 MG capsule [id number] Yes Take 1 capsule (60 mg total) by mouth daily. For chronic leg pains Delbert Clam, MD  Active Self, Pharmacy Records           Med Note MURRAY, KATAWBBA   Wed Dec 11, 2023  1:23 PM)    ergocalciferol  (DRISDOL ) 1.25 MG (50000 UT) capsule  [id number] Yes Take 1 capsule (50,000 Units total) by mouth once a week. Newlin, Enobong, MD  Active Self, Pharmacy Records  ezetimibe  (ZETIA ) 10 MG tablet [id number] Yes Take 1 tablet (10 mg total) by mouth daily. Okey Vina GAILS, MD  Active Self, Pharmacy Records  ferrous sulfate  [facility]) 325 (65 FE) MG tablet [id number] Yes Take 1 tablet (325 mg total) by mouth 2 (two) times daily with a meal. Delbert Clam, MD  Active Self, Pharmacy Records  folic acid  (FOLVITE ) 1 MG tablet [id number] Yes Take 1 tablet (1 mg total) by mouth daily. Newlin, Enobong, MD  Active Self, Pharmacy Records  furosemide  (LASIX ) 40 MG tablet [id number] Yes Take 1 tablet (40 mg total) by mouth 2 (two) times daily. Newlin, Enobong, MD  Active Self, Pharmacy Records  gabapentin  (NEURONTIN ) 300 MG capsule [id number] Yes Take 1 capsule (300 mg total) by mouth at bedtime. For leg pains Delbert Clam, MD  Active Self, Pharmacy Records  ipratropium-albuterol  (DUONEB) 0.5-2.5 (3) MG/3ML SOLN [id number] Yes Take 3 mLs by nebulization every 6 (six) hours. Waymond Cart, MD  Active   isosorbide -hydrALAZINE  (BIDIL ) 20-37.5 MG tablet [id number] Yes Take 1 tablet by mouth 3 (three) times daily. Waymond Cart, MD  Active   nitroGLYCERIN  (NITROSTAT ) 0.4 MG SL tablet [id number] Yes Place 0.4 mg under the tongue every 5 (five) minutes as needed for chest pain.  [provider]  Active Pharmacy Records, Self  OXYGEN  [id number] Yes Inhale 2 L/min into the lungs continuous.  Patient taking differently: Inhale 4 L/min into the lungs continuous.   [provider]  Active Self, Pharmacy Records  pantoprazole  (PROTONIX ) 40 MG tablet [id number] Yes Take 1 tablet (40 mg total) by mouth daily. Newlin, Enobong, MD  Active Self, Pharmacy Records  rivaroxaban  (XARELTO ) 20 MG TABS tablet [id number] Yes Take 1 tablet (20 mg total) by mouth daily with supper. Newlin, Enobong, MD  Active Self, Pharmacy Records  sacubitril -valsartan  (ENTRESTO ) 24-26 MG  [id number] Yes Take 1 tablet by mouth 2 (two) times daily. Waymond Cart, MD  Active     Discontinued 04/04/20 1029             Recommendation:   Continue Current Plan of Care  Follow Up Plan:   Telephone follow-up in 1 week  Andrea Dimes RN, BSN [place]  [facility] Health RN Care Manager [phone]

## 2024-02-21 NOTE — Patient Instructions (Signed)
 Visit Information  Thank you for taking time to visit with me today. Please don't hesitate to contact me if I can be of assistance to you before our next scheduled telephone appointment.   Following is a copy of your care plan:   Goals Addressed             This Visit's Progress    [facility] Transitions of Care (TOC) Care Plan       Problems:  Recent Hospitalization for treatment of COPD Medication management barrier Patient seems overwhelmed with medication management  Goal:  Over the next 30 days, the patient will not experience hospital readmission  Interventions:  Transitions of Care: Doctor Visits  - discussed the importance of doctor visits Post discharge activity limitations prescribed by provider reviewed Reviewed Signs and symptoms of infection Medication Review, patient has decided to wait on compliance packaging Reviewed upcoming appointments including:PCP on 02/24/24, Pulmonology on 03/09/24 and Cardiology on 03/23/24  COPD Interventions: Advised patient to engage in light exercise as tolerated 3-5 days a week to aid in the the management of COPD Advised patient to track and manage COPD triggers Advised patient to self assesses COPD action plan zone and make appointment with provider if in the yellow zone for 48 hours without improvement Discussed the importance of adequate rest and management of fatigue with COPD Provided education about and advised patient to utilize infection prevention strategies to reduce risk of respiratory infection Provided instruction about proper use of medications used for management of COPD including inhalers Provided patient with basic written and verbal COPD education on self care/management/and exacerbation prevention Use of home oxygen  Discussed the importance of smoking cessation-patient is working on cutting back   Patient Self Care Activities:  Attend all scheduled provider appointments Call pharmacy for medication refills 3-7 days in  advance of running out of medications Call provider office for new concerns or questions  Notify RN Care Manager of TOC call rescheduling needs Participate in Transition of Care Program/Attend TOC scheduled calls Take medications as prescribed   do breathing exercises every day eliminate symptom triggers at home keep follow-up appointments: PCP on 02/24/24, Pulmonology on 03/09/24 and Cardiology on 03/13/24 eat healthy/prescribed diet: low sodium, heart healthy do breathing exercises every day Use Bipap nightly  Plan:  Telephone follow up appointment with care management team member scheduled for:  02/28/24 at 1:15pm        Patient verbalizes understanding of instructions and care plan provided today and agrees to view in MyChart. Active MyChart status and patient understanding of how to access instructions and care plan via MyChart confirmed with patient.     Telephone follow up appointment with care management team member scheduled for:02/28/24 at 1:15pm  Please call the care guide team at [phone] if you need to cancel or reschedule your appointment.   Please call 1-800-273-TALK (toll free, 24 hour hotline) go to [facility] [address], [place] [phone]) call 911 if you are experiencing a Mental Health or Behavioral Health Crisis or need someone to talk to.  Andrea Dimes RN, BSN [place]  [facility] RN Care Manager [phone]

## 2024-02-24 ENCOUNTER — Ambulatory Visit: Attending: Family Medicine | Admitting: Family Medicine

## 2024-02-24 ENCOUNTER — Encounter: Payer: Self-pay | Admitting: Family Medicine

## 2024-02-24 ENCOUNTER — Other Ambulatory Visit: Payer: Self-pay

## 2024-02-24 VITALS — BP 135/62 | HR 70 | Ht 73.0 in | Wt 205.4 lb

## 2024-02-24 DIAGNOSIS — E1169 Type 2 diabetes mellitus with other specified complication: Secondary | ICD-10-CM

## 2024-02-24 DIAGNOSIS — D6832 Hemorrhagic disorder due to extrinsic circulating anticoagulants: Secondary | ICD-10-CM

## 2024-02-24 DIAGNOSIS — K254 Chronic or unspecified gastric ulcer with hemorrhage: Secondary | ICD-10-CM | POA: Diagnosis not present

## 2024-02-24 DIAGNOSIS — Z9981 Dependence on supplemental oxygen: Secondary | ICD-10-CM

## 2024-02-24 DIAGNOSIS — G4440 Drug-induced headache, not elsewhere classified, not intractable: Secondary | ICD-10-CM

## 2024-02-24 DIAGNOSIS — D500 Iron deficiency anemia secondary to blood loss (chronic): Secondary | ICD-10-CM | POA: Diagnosis not present

## 2024-02-24 DIAGNOSIS — R040 Epistaxis: Secondary | ICD-10-CM | POA: Diagnosis not present

## 2024-02-24 DIAGNOSIS — I5042 Chronic combined systolic (congestive) and diastolic (congestive) heart failure: Secondary | ICD-10-CM

## 2024-02-24 DIAGNOSIS — I4892 Unspecified atrial flutter: Secondary | ICD-10-CM | POA: Diagnosis not present

## 2024-02-24 DIAGNOSIS — F17210 Nicotine dependence, cigarettes, uncomplicated: Secondary | ICD-10-CM | POA: Diagnosis not present

## 2024-02-24 DIAGNOSIS — T148XXA Other injury of unspecified body region, initial encounter: Secondary | ICD-10-CM

## 2024-02-24 DIAGNOSIS — I110 Hypertensive heart disease with heart failure: Secondary | ICD-10-CM

## 2024-02-24 DIAGNOSIS — Z7982 Long term (current) use of aspirin: Secondary | ICD-10-CM

## 2024-02-24 LAB — POCT GLYCOSYLATED HEMOGLOBIN (HGB A1C): HbA1c, POC (controlled diabetic range): 6 % (ref 0.0–7.0)

## 2024-02-24 MED ORDER — FUROSEMIDE 40 MG PO TABS
40.0000 mg | ORAL_TABLET | Freq: Two times a day (BID) | ORAL | 1 refills | Status: AC
  Filled 2024-02-24 – 2024-05-13 (×2): qty 180, 90d supply, fill #0

## 2024-02-24 MED ORDER — BUTALBITAL-APAP-CAFFEINE 50-325-40 MG PO TABS
1.0000 | ORAL_TABLET | Freq: Two times a day (BID) | ORAL | 1 refills | Status: AC | PRN
  Filled 2024-02-24: qty 30, 15d supply, fill #0
  Filled 2024-03-30: qty 30, 15d supply, fill #1

## 2024-02-24 MED ORDER — DULOXETINE HCL 60 MG PO CPEP
60.0000 mg | ORAL_CAPSULE | Freq: Every day | ORAL | 1 refills | Status: AC
  Filled 2024-02-24 – 2024-03-30 (×2): qty 90, 90d supply, fill #0
  Filled 2024-06-15: qty 90, 90d supply, fill #1

## 2024-02-24 MED ORDER — BUPROPION HCL ER (SR) 150 MG PO TB12
ORAL_TABLET | ORAL | 3 refills | Status: DC
  Filled 2024-02-24: qty 60, 31d supply, fill #0
  Filled 2024-03-30: qty 60, 30d supply, fill #1
  Filled 2024-05-13: qty 60, 30d supply, fill #2
  Filled 2024-06-15: qty 60, 30d supply, fill #3

## 2024-02-24 MED ORDER — FLUTICASONE PROPIONATE 50 MCG/ACT NA SUSP
2.0000 | Freq: Every day | NASAL | 6 refills | Status: AC
  Filled 2024-02-24: qty 16, 30d supply, fill #0
  Filled 2024-03-30: qty 16, 30d supply, fill #1
  Filled 2024-05-13: qty 16, 30d supply, fill #2
  Filled 2024-06-15: qty 16, 30d supply, fill #3

## 2024-02-24 MED ORDER — RIVAROXABAN 20 MG PO TABS
20.0000 mg | ORAL_TABLET | Freq: Every day | ORAL | 1 refills | Status: AC
  Filled 2024-02-24 – 2024-03-30 (×2): qty 90, 90d supply, fill #0
  Filled 2024-06-15: qty 90, 90d supply, fill #1

## 2024-02-24 MED ORDER — PANTOPRAZOLE SODIUM 40 MG PO TBEC
40.0000 mg | DELAYED_RELEASE_TABLET | Freq: Every day | ORAL | 1 refills | Status: AC
  Filled 2024-02-24 – 2024-03-30 (×2): qty 90, 90d supply, fill #0
  Filled 2024-06-15: qty 90, 90d supply, fill #1

## 2024-02-24 NOTE — Progress Notes (Signed)
 Subjective:  Patient ID: Jared [name], male    DOB: [birth year]  Age: 68 y.o. MRN: [id number]  CC: Hospitalization Follow-up (Nose bleeds/Constant headaches/Bruise on right thigh)     Discussed the use of AI scribe software for clinical note transcription with the patient, who gave verbal consent to proceed.  History of Present Illness Jaceon Heiberger is a 68 year old male with   a history of  hypertension, COPD, tobacco abuse, NICM , CHF (EF 55-60% from echo 04/2023), atrial flutter, chronic respiratory failure with hypoxia (currently on 2-3 L of oxygen ), multiple hospitalizations for GI bleed with secondary anemia due to AV malformations, peptic ulcer disease, previous history of CVA, Type 2 DM, PAD status post left femoral-popliteal angioplasty and stenting (in 11/2022), status post laparoscopy appendectomy in 04/2023, iron  deficiency anemia (currently receiving IV iron  infusion weekly). who presents with worsening headaches and epistaxis.  He experiences constant headaches throughout his head, unrelieved by frequent Tylenol  use.  Symptoms have been chronic with no complaints of photophobia or phonophobia, nausea or vomiting.  His medications, isosorbide / hydralazine , are known to bruising headaches.  Epistaxis occurs primarily in the mornings, especially after nose blowing, with no passage of blood clots and bleeding resolved spontaneously, described as minimal. He uses baby oil for nasal dryness but lacks a nasal spray.  He has COPD, uses three liters of oxygen , and has difficulty breathing. He expresses a desire to quit smoking after over fifty years. He was discharged earlier this month for acute respiratory failure secondary to COPD exacerbation.  He continues with his inhalers and will be seeing his pulmonologist soon.  Bruising on his thigh is associated with a medication from the cancer center as he recalls being told bevacizumab  administered biweekly could cause bruising. He has not taken  this medication recently. He receives iron  infusions for anemia .He is uncertain about his next infusion schedule.  No constipation or blood in stool.    Past Medical History:  Diagnosis Date   Acute blood loss anemia 05/18/2023   Acute perforated appendicitis 04/30/2023   AKI (acute kidney injury) (HCC) 11/18/2020   Atrial fibrillation with RVR (HCC) 05/06/2023   AVM (arteriovenous malformation)    CAD (coronary artery disease)    a. LHC 5/12:  LAD 20, pLCx 20, pRCA 40, dRCA 40, EF 35%, diff HK  //  b. Myoview 4/16: Overall Impression:  High risk stress nuclear study There is no evidence of ischemia.  There is severe LV dysfunction. LV Ejection Fraction: 30%.  LV Wall Motion:  There is global LV hypokinesis.     CAP (community acquired pneumonia) 09/2013   Chronic combined systolic and diastolic CHF (congestive heart failure) (HCC)    a. Echo 4/16:Mild LVH, EF 40-45%, diffuse HK //  b. Echo 8/17: EF 35-40%, diffuse HK, diastolic dysfunction, aortic sclerosis, trivial MR, moderate LAE, normal RVSF, moderate RAE, mild TR, PASP 42 mmHg // c. Echo 4/18: Mild concentric LVH, EF 30-35, normal wall motion, grade 1 diastolic dysfunction, PASP 49   Chronic respiratory failure (HCC)    Cluster headache    hx; haven't had one in awhile (01/09/2016)   COPD (chronic obstructive pulmonary disease) (HCC)    thelbert 01/09/2016   DM (diabetes mellitus) (HCC)    History of CVA (cerebrovascular accident)    Hypertension    IDA (iron  deficiency anemia)    Moderate tobacco use disorder    NICM (nonischemic cardiomyopathy) (HCC)    Nicotine  addiction    Peripheral vascular disease (  HCC)    Syncope 06/11/2019   Tobacco abuse    Type 2 diabetes mellitus (HCC) 05/14/2016    Past Surgical History:  Procedure Laterality Date   ABDOMINAL AORTOGRAM W/LOWER EXTREMITY N/A 12/07/2022   Procedure: ABDOMINAL AORTOGRAM W/LOWER EXTREMITY;  Surgeon: Magda Debby SAILOR, MD;  [facility];  Service:  Cardiovascular;  Laterality: N/A;   ABDOMINAL AORTOGRAM W/LOWER EXTREMITY Left 08/02/2023   Procedure: ABDOMINAL AORTOGRAM W/LOWER EXTREMITY;  Surgeon: Magda Debby SAILOR, MD;  Location: [facility];  Service: Cardiovascular;  Laterality: Left;   BALLOON ENTEROSCOPY N/A 11/19/2023   Procedure: ENTEROSCOPY, USING BALLOON;  Surgeon: San Sandor GAILS, DO;  Location: [facility];  Service: Gastroenterology;  Laterality: N/A;  single balloon enteroscopy   BIOPSY  11/12/2020   Procedure: BIOPSY;  Surgeon: Wilhelmenia Aloha Raddle., MD;  Location: [facility];  Service: Gastroenterology;;   CARDIAC CATHETERIZATION  10/2010   LM normal, LAD with 20% irregularities, LCX with 20%, RCA with 40% prox and 40% distal - EF of 35%   CATARACT EXTRACTION, BILATERAL     COLONOSCOPY W/ BIOPSIES AND POLYPECTOMY     COLONOSCOPY WITH PROPOFOL  N/A 09/06/2018   Procedure: COLONOSCOPY WITH PROPOFOL ;  Surgeon: Eda Iha, MD;  Location: [facility] ENDOSCOPY;  Service: Gastroenterology;  Laterality: N/A;   ENTEROSCOPY N/A 09/28/2018   Procedure: ENTEROSCOPY;  Surgeon: Rollin Dover, MD;  Location: [facility] ENDOSCOPY;  Service: Endoscopy;  Laterality: N/A;   ENTEROSCOPY N/A 10/28/2018   Procedure: ENTEROSCOPY;  Surgeon: Eda Iha, MD;  Location: [facility] ENDOSCOPY;  Service: Gastroenterology;  Laterality: N/A;   ENTEROSCOPY N/A 10/09/2020   Procedure: ENTEROSCOPY;  Surgeon: Abran Norleen SAILOR, MD;  Location: [facility] ENDOSCOPY;  Service: Endoscopy;  Laterality: N/A;   ENTEROSCOPY N/A 03/22/2022   Procedure: ENTEROSCOPY;  Surgeon: Leigh Elspeth SQUIBB, MD;  Location: [facility] ENDOSCOPY;  Service: Gastroenterology;  Laterality: N/A;   ENTEROSCOPY N/A 05/20/2023   Procedure: ENTEROSCOPY;  Surgeon: Legrand Victory LITTIE DOUGLAS, MD;  Location: [facility] ENDOSCOPY;  Service: Gastroenterology;  Laterality: N/A;   ENTEROSCOPY N/A 08/24/2023   Procedure: ENTEROSCOPY;  Surgeon: Suzann [name] HERO, MD;  Location: [facility] ENDOSCOPY;  Service: Gastroenterology;  Laterality: N/A;    ESOPHAGOGASTRODUODENOSCOPY N/A 11/12/2020   Procedure: ESOPHAGOGASTRODUODENOSCOPY (EGD);  Surgeon: Wilhelmenia Aloha Raddle., MD;  Location: [facility];  Service: Gastroenterology;  Laterality: N/A;   ESOPHAGOGASTRODUODENOSCOPY (EGD) WITH PROPOFOL  N/A 09/05/2018   Procedure: ESOPHAGOGASTRODUODENOSCOPY (EGD) WITH PROPOFOL ;  Surgeon: Leigh Elspeth SQUIBB, MD;  Location: [facility] ENDOSCOPY;  Service: Gastroenterology;  Laterality: N/A;   ESOPHAGOGASTRODUODENOSCOPY (EGD) WITH PROPOFOL  N/A 11/19/2020   Procedure: ESOPHAGOGASTRODUODENOSCOPY (EGD) WITH PROPOFOL ;  Surgeon: Albertus Gordy HERO, MD;  Location: [facility];  Service: Gastroenterology;  Laterality: N/A;   ESOPHAGOGASTRODUODENOSCOPY (EGD) WITH PROPOFOL  N/A 12/27/2022   Procedure: ESOPHAGOGASTRODUODENOSCOPY (EGD) WITH PROPOFOL ;  Surgeon: Shila Gustav GAILS, MD;  [facility];  Service: Gastroenterology;  Laterality: N/A;   EXCISION MASS HEAD     GIVENS CAPSULE STUDY N/A 09/06/2018   Procedure: GIVENS CAPSULE STUDY;  Surgeon: Eda Iha, MD;  Location: [facility] ENDOSCOPY;  Service: Gastroenterology;  Laterality: N/A;   GIVENS CAPSULE STUDY N/A 09/26/2018   Procedure: GIVENS CAPSULE STUDY;  Surgeon: Albertus Gordy HERO, MD;  Location: [facility] ENDOSCOPY;  Service: Gastroenterology;  Laterality: N/A;   GIVENS CAPSULE STUDY N/A 06/14/2019   Procedure: GIVENS CAPSULE STUDY;  Surgeon: Shila Gustav GAILS, MD;  Location[facility];  Service: Endoscopy;  Laterality: N/A;   HEMOSTASIS CLIP PLACEMENT  11/12/2020   Procedure: HEMOSTASIS CLIP PLACEMENT;  Surgeon: Wilhelmenia,  Aloha Raddle., MD;  Location: [facility];  Service: Gastroenterology;;   HEMOSTASIS CONTROL  11/12/2020   Procedure: HEMOSTASIS CONTROL;  Surgeon: Wilhelmenia Aloha Raddle., MD;  Location: [facility];  Service: Gastroenterology;;   HOT HEMOSTASIS N/A 10/28/2018   Procedure: HOT HEMOSTASIS (ARGON PLASMA COAGULATION/BICAP);  Surgeon: Eda Iha, MD;  Location: [facility] ENDOSCOPY;  Service: Gastroenterology;   Laterality: N/A;   HOT HEMOSTASIS N/A 11/12/2020   Procedure: HOT HEMOSTASIS (ARGON PLASMA COAGULATION/BICAP);  Surgeon: Wilhelmenia Aloha Raddle., MD;  Location: [facility];  Service: Gastroenterology;  Laterality: N/A;   HOT HEMOSTASIS N/A 03/22/2022   Procedure: HOT HEMOSTASIS (ARGON PLASMA COAGULATION/BICAP);  Surgeon: Leigh Elspeth SQUIBB, MD;  Location: [facility] ENDOSCOPY;  Service: Gastroenterology;  Laterality: N/A;   HOT HEMOSTASIS N/A 05/20/2023   Procedure: HOT HEMOSTASIS (ARGON PLASMA COAGULATION/BICAP);  Surgeon: Legrand Victory LITTIE DOUGLAS, MD;  Location: [facility] ENDOSCOPY;  Service: Gastroenterology;  Laterality: N/A;   HOT HEMOSTASIS N/A 08/24/2023   Procedure: EGD, WITH ARGON PLASMA COAGULATION;  Surgeon: Suzann [name] HERO, MD;  Location: [facility] ENDOSCOPY;  Service: Gastroenterology;  Laterality: N/A;   INCISION AND DRAINAGE PERIRECTAL ABSCESS N/A 06/05/2017   Procedure: IRRIGATION AND DEBRIDEMENT PERIRECTAL ABSCESS;  Surgeon: Teresa Lonni HERO, MD;  Location: [facility];  Service: General;  Laterality: N/A;   IR CATHETER TUBE CHANGE  05/14/2023   LAPAROSCOPIC APPENDECTOMY N/A 05/01/2023   Procedure: APPENDECTOMY LAPAROSCOPIC;  Surgeon: Ebbie Cough, MD;  Location: [facility] OR;  Service: General;  Laterality: N/A;   PERIPHERAL VASCULAR BALLOON ANGIOPLASTY Left 08/02/2023   Procedure: PERIPHERAL VASCULAR BALLOON ANGIOPLASTY;  Surgeon: Magda Debby SAILOR, MD;  [facility];  Service: Cardiovascular;  Laterality: Left;   PERIPHERAL VASCULAR INTERVENTION  12/07/2022   Procedure: PERIPHERAL VASCULAR INTERVENTION;  Surgeon: Magda Debby SAILOR, MD;  [facility];  Service: Cardiovascular;;   PERIPHERAL VASCULAR INTERVENTION Left 08/02/2023   Procedure: PERIPHERAL VASCULAR INTERVENTION;  Surgeon: Magda Debby SAILOR, MD;  [facility];  Service: Cardiovascular;  Laterality: Left;   SUBMUCOSAL TATTOO INJECTION  11/12/2020   Procedure: SUBMUCOSAL TATTOO INJECTION;  Surgeon:  Wilhelmenia Aloha Raddle., MD;  Location: [facility];  Service: Gastroenterology;;   SUBMUCOSAL TATTOO INJECTION  05/20/2023   Procedure: SUBMUCOSAL TATTOO INJECTION;  Surgeon: Legrand Victory LITTIE DOUGLAS, MD;  Location: [facility] ENDOSCOPY;  Service: Gastroenterology;;   VIDEO BRONCHOSCOPY Bilateral 05/08/2016   Procedure: VIDEO BRONCHOSCOPY WITH FLUORO;  Surgeon: Harden LULLA Staff, MD;  Location: [facility] ENDOSCOPY;  Service: Cardiopulmonary;  Laterality: Bilateral;    Family History  Problem Relation Age of Onset   Heart disease Mother    Diabetes Mother    Colon cancer Mother    Liver cancer Mother    Cancer Father        type unknown   Diabetes Sister        x 2   Diabetes Brother     Social History   Socioeconomic History   Marital status: Single    Spouse name: Not on file   Number of children: 1   Years of education: Not on file   Highest education level: 12th grade  Occupational History   Occupation: retired  Tobacco Use   Smoking status: Every Day    Current packs/day: 0.50    Average packs/day: 0.5 packs/day for 47.0 years (23.5 ttl pk-yrs)    Types: Cigarettes   Smokeless tobacco: Never   Tobacco comments:    4/5 cigs  Vaping Use   Vaping status: Never Used  Substance  and Sexual Activity   Alcohol use: Not Currently    Alcohol/week: 0.0 standard drinks of alcohol    Comment: last drink was before xmas   Drug use: No    Types: Cocaine, Marijuana    Comment: nothing in 20 years   Sexual activity: Not Currently  Other Topics Concern   Not on file  Social History Narrative   unemployed   Social Drivers of Corporate investment banker Strain: Low Risk  (06/10/2023)   Overall Financial Resource Strain (CARDIA)    Difficulty of Paying Living Expenses: Not hard at all  Food Insecurity: No Food Insecurity (02/14/2024)   Hunger Vital Sign    Worried About Running Out of Food in the Last Year: Never true    Ran Out of Food in the Last Year: Never true  Transportation Needs: No  Transportation Needs (02/14/2024)   PRAPARE - Administrator, Civil Service (Medical): No    Lack of Transportation (Non-Medical): No  Physical Activity: Insufficiently Active (06/10/2023)   Exercise Vital Sign    Days of Exercise per Week: 3 days    Minutes of Exercise per Session: 20 min  Stress: No Stress Concern Present (06/10/2023)   [employer] of Occupational Health - Occupational Stress Questionnaire    Feeling of Stress : Not at all  Social Connections: Moderately Isolated (02/09/2024)   Social Connection and Isolation Panel    Frequency of Communication with Friends and Family: More than three times a week    Frequency of Social Gatherings with Friends and Family: Twice a week    Attends Religious Services: Never    Database administrator or Organizations: Patient declined    Attends Engineer, structural: 1 to 4 times per year    Marital Status: Divorced    Allergies  Allergen Reactions   Lisinopril  Cough    Outpatient Medications Prior to Visit  Medication Sig Dispense Refill   albuterol  (PROVENTIL ) (2.5 MG/3ML) 0.083% nebulizer solution Take 3 mLs (2.5 mg total) by nebulization every 2 (two) hours as needed for wheezing or shortness of breath. 75 mL 1   albuterol  (VENTOLIN  HFA) 108 (90 Base) MCG/ACT inhaler INHALE 2 PUFFS INTO THE LUNGS EVERY 6 (SIX) HOURS AS NEEDED FOR WHEEZING OR SHORTNESS OF BREATH. 18 g 6   aspirin  EC 81 MG tablet Take 81 mg by mouth daily. Swallow whole.     atorvastatin  (LIPITOR ) 80 MG tablet Take 1 tablet (80 mg total) by mouth daily. 90 tablet 1   bisoprolol  (ZEBETA ) 5 MG tablet Take 1 tablet (5 mg total) by mouth daily. 30 tablet 0   budesonide  (PULMICORT ) 0.5 MG/2ML nebulizer solution Take 2 mLs (0.5 mg total) by nebulization 2 (two) times daily. 120 mL 0   budesonide -glycopyrrolate -formoterol  (BREZTRI  AEROSPHERE) 160-9-4.8 MCG/ACT AERO inhaler Take 2 puffs first thing in morning and then another 2 puffs about 12 hours  later. 10.7 g 11   ergocalciferol  (DRISDOL ) 1.25 MG (50000 UT) capsule Take 1 capsule (50,000 Units total) by mouth once a week. 12 capsule 1   ezetimibe  (ZETIA ) 10 MG tablet Take 1 tablet (10 mg total) by mouth daily. 90 tablet 2   ferrous sulfate  (FEROSUL) 325 (65 FE) MG tablet Take 1 tablet (325 mg total) by mouth 2 (two) times daily with a meal. 180 tablet 1   folic acid  (FOLVITE ) 1 MG tablet Take 1 tablet (1 mg total) by mouth daily. 90 tablet 1   gabapentin  (NEURONTIN ) 300  MG capsule Take 1 capsule (300 mg total) by mouth at bedtime. For leg pains 90 capsule 1   ipratropium-albuterol  (DUONEB) 0.5-2.5 (3) MG/3ML SOLN Take 3 mLs by nebulization every 6 (six) hours. 360 mL 0   isosorbide -hydrALAZINE  (BIDIL ) 20-37.5 MG tablet Take 1 tablet by mouth 3 (three) times daily. 90 tablet 0   nitroGLYCERIN  (NITROSTAT ) 0.4 MG SL tablet Place 0.4 mg under the tongue every 5 (five) minutes as needed for chest pain.     OXYGEN  Inhale 2 L/min into the lungs continuous. (Patient taking differently: Inhale 4 L/min into the lungs continuous.)     sacubitril -valsartan  (ENTRESTO ) 24-26 MG Take 1 tablet by mouth 2 (two) times daily. 60 tablet 0   DULoxetine  (CYMBALTA ) 60 MG capsule Take 1 capsule (60 mg total) by mouth daily. For chronic leg pains 90 capsule 1   furosemide  (LASIX ) 40 MG tablet Take 1 tablet (40 mg total) by mouth 2 (two) times daily. 180 tablet 1   pantoprazole  (PROTONIX ) 40 MG tablet Take 1 tablet (40 mg total) by mouth daily. 90 tablet 1   rivaroxaban  (XARELTO ) 20 MG TABS tablet Take 1 tablet (20 mg total) by mouth daily with supper. 90 tablet 1   No facility-administered medications prior to visit.     ROS Review of Systems  Constitutional:  Negative for activity change and appetite change.  HENT:  Positive for nosebleeds. Negative for sinus pressure and sore throat.   Respiratory:  Negative for chest tightness, shortness of breath and wheezing.   Cardiovascular:  Negative for chest pain  and palpitations.  Gastrointestinal:  Negative for abdominal distention, abdominal pain and constipation.  Genitourinary: Negative.   Musculoskeletal: Negative.   Skin:  Positive for color change.  Neurological:  Positive for headaches.  Psychiatric/Behavioral:  Negative for behavioral problems and dysphoric mood.     Objective:  BP 135/62   Pulse 70   Ht 6' 1 (1.854 m)   Wt 205 lb 6.4 oz (93.2 kg)   SpO2 90%   BMI 27.10 kg/m      02/24/2024    3:04 PM 02/14/2024    2:34 PM 02/13/2024    4:22 PM  BP/Weight  Systolic BP 135  850  Diastolic BP 62  81  Wt. (Lbs) 205.4 204   BMI 27.1 kg/m2 26.91 kg/m2       Physical Exam Constitutional:      Appearance: He is well-developed.  HENT:     Nose:     Comments: On 3 L of oxygen  via nasal cannula Cardiovascular:     Rate and Rhythm: Normal rate.     Heart sounds: Normal heart sounds. No murmur heard. Pulmonary:     Effort: Pulmonary effort is normal.     Breath sounds: Normal breath sounds. No wheezing or rales.  Chest:     Chest wall: No tenderness.  Abdominal:     General: Bowel sounds are normal. There is no distension.     Palpations: Abdomen is soft. There is no mass.     Tenderness: There is no abdominal tenderness.  Musculoskeletal:        General: Normal range of motion.     Right lower leg: No edema.     Left lower leg: No edema.  Skin:    Findings: Bruising (on R thigh and single patch on L forearm) present.  Neurological:     Mental Status: He is alert and oriented to person, place, and time.  Psychiatric:  Mood and Affect: Mood normal.        Latest Ref Rng & Units 02/13/2024    2:18 AM 02/12/2024    2:17 AM 02/11/2024    2:59 AM  CMP  Glucose 70 - 99 mg/dL 866  871  866   BUN 8 - 23 mg/dL 15  14  14    Creatinine 0.61 - 1.24 mg/dL 9.22  9.34  9.23   Sodium 135 - 145 mmol/L 145  144  144   Potassium 3.5 - 5.1 mmol/L 4.3  3.8  3.7   Chloride 98 - 111 mmol/L 96  94  94   CO2 22 - 32 mmol/L 44  41   38   Calcium  8.9 - 10.3 mg/dL 8.7  8.8  8.8     Lipid Panel     Component Value Date/Time   CHOL 127 10/25/2022 1027   TRIG 80 10/25/2022 1027   HDL 49 10/25/2022 1027   CHOLHDL 2.9 01/29/2020 1020   CHOLHDL 2.8 01/11/2016 0446   VLDL 14 01/11/2016 0446   LDLCALC 62 10/25/2022 1027    CBC    Component Value Date/Time   WBC 8.0 02/13/2024 0218   RBC 4.12 (L) 02/13/2024 0218   HGB 10.6 (L) 02/13/2024 0218   HGB 11.5 (L) 02/06/2024 1417   HGB 10.2 (L) 06/10/2023 1012   HCT 39.3 02/13/2024 0218   HCT 33.9 (L) 06/10/2023 1012   PLT 214 02/13/2024 0218   PLT 162 02/06/2024 1417   PLT 332 06/10/2023 1012   MCV 95.4 02/13/2024 0218   MCV 87 06/10/2023 1012   MCH 25.7 (L) 02/13/2024 0218   MCHC 27.0 (L) 02/13/2024 0218   RDW 14.4 02/13/2024 0218   RDW 14.3 06/10/2023 1012   LYMPHSABS 1.2 02/06/2024 1417   LYMPHSABS 2.7 06/10/2023 1012   MONOABS 0.5 02/06/2024 1417   EOSABS 0.2 02/06/2024 1417   EOSABS 0.1 06/10/2023 1012   BASOSABS 0.0 02/06/2024 1417   BASOSABS 0.1 06/10/2023 1012    Lab Results  Component Value Date   HGBA1C 5.4 06/10/2023       Assessment & Plan Chronic obstructive pulmonary disease (COPD) Recent hospitalization for exacerbation -Currently on chronic oxygen  therapy Smoking cessation crucial for management. - Encourage smoking cessation. - Continue inhalers - Follow-up with pulmonary  Cigarette dependence Long-standing dependence. Discussed pharmacological aids. - Prescribe Wellbutrin . Start one pill daily for three days, then twice daily.  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 Tylenol   ineffective. - Prescribe Fioricet . Advise against concurrent Tylenol  use.  Iron  deficiency anemia due to chronic blood loss Managed with iron  infusions. Hemoglobin 10.6. Uncertainty about continuation. - Advise contacting cancer center to confirm continuation of iron  infusions.  Bruising secondary to bevacizumab  infusions Bruising likely due to bevacizumab . Uncertain about future treatments. - Advise discussing bruising and future treatment plans with cancer center.  Epistaxis Recurrent, likely due to dry nasal passages from oxygen  use. - Prescribe nasal spray for dryness. - Advise reporting persistent bleeding for potential ENT referral.  Type 2 diabetes mellitus Controlled with A1c of 6.0 Continue diet control   Atrial flutter Currently in sinus rhythm Continue Eliquis for anticoagulation Continue rate control with beta-blocker  Gastritis - Remains on PPI    Meds ordered this encounter  Medications   buPROPion  (WELLBUTRIN  SR) 150 MG 12 hr tablet    Sig: Take 1 tablet (150 mg total) by mouth 2 (two) times daily. Daily for 3 days then increase to twice daily thereafter for smoking cessation    Dispense:  60 tablet    Refill:  3   fluticasone  (FLONASE ) 50 MCG/ACT nasal spray    Sig: Place 2 sprays into both nostrils daily.    Dispense:  16 g    Refill:  6   butalbital -acetaminophen -caffeine  (FIORICET ) 50-325-40 MG tablet    Sig: Take 1 tablet by mouth every 12 (twelve) hours as needed for headache.    Dispense:  30 tablet    Refill:  1   DULoxetine  (CYMBALTA ) 60 MG capsule    Sig: Take 1 capsule (60 mg total) by mouth daily. For chronic leg pains    Dispense:  90 capsule    Refill:  1   furosemide  (LASIX ) 40 MG tablet    Sig: Take 1 tablet (40 mg total) by mouth 2 (two) times daily.    Dispense:  180 tablet    Refill:  1   pantoprazole  (PROTONIX ) 40 MG tablet    Sig: Take 1 tablet (40 mg total) by mouth daily.    Dispense:  90 tablet    Refill:  1   rivaroxaban   (XARELTO ) 20 MG TABS tablet    Sig: Take 1 tablet (20 mg total) by mouth daily with supper.    Dispense:  90 tablet    Refill:  1    Follow-up: No follow-ups on file.       Corrina Sabin, MD, FAAFP. [facility] [place], [place] [phone]   02/24/2024, 3:42 PM

## 2024-02-24 NOTE — Patient Instructions (Signed)
 Nosebleed, Adult A nosebleed is when blood comes out of the nose. Nosebleeds are common. Usually, they are not a sign of a serious condition. Nosebleeds can happen if a blood vessel in your nose starts to bleed or if the lining of your nose (mucous membrane) cracks. They are commonly caused by: Allergies. Colds. Picking your nose. Blowing your nose too hard. An injury from sticking an object into your nose or getting hit in the nose. Dry or cold air. Less common causes of nosebleeds include: Toxic fumes. Something abnormal in the nose or in the air-filled spaces in the bones of the face (sinuses). Growths in the nose, such as polyps. Blood thinners or conditions that cause blood to clot slowly. Certain illnesses or procedures that irritate or dry out the nasal passages. Follow these instructions at home: When you have a nosebleed:  Sit down and tilt your head slightly forward. Use a clean towel or tissue to pinch your nostrils under the bony part of your nose. After 5 minutes, let go of your nose and see if the bleeding starts again. Do not release pressure before that time. If there is still bleeding, repeat the pinching and holding for 5 minutes or until the bleeding stops. Do not place tissues or gauze in the nose to stop the bleeding. Avoid lying down and avoid tilting your head backward. That may cause blood to collect in the throat and cause gagging or coughing. Use a nasal spray decongestant to help with a nosebleed as told by your health care provider. After a nosebleed: Avoid blowing your nose or sniffing for a number of hours. Avoid straining, lifting, or bending at the waist for several days. You may go back to other normal activities as you are able. If you are taking aspirin or blood thinners and you have nosebleeds, talk to your health care provider. These medicines make bleeding more likely. Ask your health care provider if you should stop taking the medicines or if you  should adjust the dose. Do not stop taking medicines that your health care provider has recommended unless he or she tells you to stop taking them. If your nosebleed was caused by dry mucous membranes, use over-the-counter saline nasal spray or gel and a humidifier as told by your health care provider. This will keep the mucous membranes moist and allow them to heal. If you need to use a nasal spray or gel: Choose one that is water-soluble. Use only as much as you need and use it only as often as needed. Do not lie down right after you use it. If you get nosebleeds often, talk with your health care provider about medical treatments. Options may include: Nasal cautery. This treatment stops and prevents nosebleeds by using a chemical swab or electrical device to lightly burn tiny blood vessels inside the nose. Nasal packing. A gauze or other material is placed in the nose to keep constant pressure on the bleeding area. Contact a health care provider if: You have a fever. You get nosebleeds often or more often than usual. You bruise very easily. You have a nose bleed from having something stuck in your nose. You have bleeding in your mouth. You vomit or cough up brown material. You have a nosebleed after you start a new medicine. Get help right away if: You have a nosebleed after a fall or a head injury. Your nosebleed does not go away after 20 minutes. You feel dizzy or weak. You have unusual bleeding from  other parts of your body. You have unusual bruising on other parts of your body. You get sweaty. You vomit blood. Summary A nosebleed is when blood comes out of the nose. Common causes include allergies, an injury to the nose, or cold or dry air. Initial treatment includes applying pressure for 5 minutes. Moisturizing the nose with saline nasal spray or gel after a nosebleed may help prevent future bleeding. Get help right away if your nosebleed does not go away after 20 minutes. This  information is not intended to replace advice given to you by your health care provider. Make sure you discuss any questions you have with your health care provider. Document Revised: 06/06/2021 Document Reviewed: 06/06/2021 Elsevier Patient Education  2024 [employer].

## 2024-02-28 ENCOUNTER — Other Ambulatory Visit: Payer: Self-pay | Admitting: *Deleted

## 2024-02-28 NOTE — Patient Instructions (Signed)
 Visit Information  Thank you for taking time to visit with me today. Please don't hesitate to contact me if I can be of assistance to you before our next scheduled telephone appointment.   Following is a copy of your care plan:   Goals Addressed             This Visit's Progress    [facility] Transitions of Care (TOC) Care Plan       Problems:  Recent Hospitalization for treatment of COPD Medication management barrier Patient seems overwhelmed with medication management  Goal:  Over the next 30 days, the patient will not experience hospital readmission  Interventions:  Transitions of Care: Doctor Visits  - discussed the importance of doctor visits Post discharge activity limitations prescribed by provider reviewed Reviewed Signs and symptoms of infection Medication Review, verified patient has obtained new medications Reviewed upcoming appointments including:Pulmonology on 03/09/24 and Cardiology on 03/23/24 Provided patient with the number to schedule a follow up with Dr. Lanny [phone] to discuss plan for continued infusions  COPD Interventions: Advised patient to engage in light exercise as tolerated 3-5 days a week to aid in the the management of COPD Discussed the importance of adequate rest and management of fatigue with COPD Provided education about and advised patient to utilize infection prevention strategies to reduce risk of respiratory infection Use of home oxygen  Discussed the importance of smoking cessation-patient discussed with PCP and now taking Wellbutrin  to aid in smoking cessation Advised using saline nasal spray to moisten nasal passages  Patient Self Care Activities:  Attend all scheduled provider appointments Call pharmacy for medication refills 3-7 days in advance of running out of medications Call provider office for new concerns or questions  Notify RN Care Manager of [facility] call rescheduling needs Participate in Transition of Care Program/Attend TOC  scheduled calls Take medications as prescribed   do breathing exercises every day eliminate symptom triggers at home keep follow-up appointments: PCP on 02/24/24, Pulmonology on 03/09/24 and Cardiology on 03/13/24 eat healthy/prescribed diet: low sodium, heart healthy do breathing exercises every day Use Bipap nightly  Plan:  Telephone follow up appointment with care management team member scheduled for:  03/06/24 at 12:45pm        Patient verbalizes understanding of instructions and care plan provided today and agrees to view in MyChart. Active MyChart status and patient understanding of how to access instructions and care plan via MyChart confirmed with patient.     Telephone follow up appointment with care management team member scheduled for:03/06/24 at 12:45pm  Please call the care guide team at [phone] if you need to cancel or reschedule your appointment.   Please call 1-800-273-TALK (toll free, 24 hour hotline) go to [facility] Urgent [facility] [address], [place] [phone]) call 911 if you are experiencing a Mental Health or Behavioral Health Crisis or need someone to talk to.  Andrea Dimes RN, BSN [place]  [facility] RN Care Manager [phone]

## 2024-02-28 NOTE — Transitions of Care (Post Inpatient/ED Visit) (Signed)
 Transition of Care week 3  Visit Note  02/28/2024  Name: Jared [name] MRN: [id number]          DOB: [birth year]  Situation: Patient enrolled in [facility] 30-day program. Visit completed with [name] by telephone.   Background:   Initial Transition Care Management Follow-up Telephone Call    Past Medical History:  Diagnosis Date   Acute blood loss anemia 05/18/2023   Acute perforated appendicitis 04/30/2023   AKI (acute kidney injury) (HCC) 11/18/2020   Atrial fibrillation with RVR (HCC) 05/06/2023   AVM (arteriovenous malformation)    CAD (coronary artery disease)    a. LHC 5/12:  LAD 20, pLCx 20, pRCA 40, dRCA 40, EF 35%, diff HK  //  b. Myoview 4/16: Overall Impression:  High risk stress nuclear study There is no evidence of ischemia.  There is severe LV dysfunction. LV Ejection Fraction: 30%.  LV Wall Motion:  There is global LV hypokinesis.     CAP (community acquired pneumonia) 09/2013   Chronic combined systolic and diastolic CHF (congestive heart failure) (HCC)    a. Echo 4/16:Mild LVH, EF 40-45%, diffuse HK //  b. Echo 8/17: EF 35-40%, diffuse HK, diastolic dysfunction, aortic sclerosis, trivial MR, moderate LAE, normal RVSF, moderate RAE, mild TR, PASP 42 mmHg // c. Echo 4/18: Mild concentric LVH, EF 30-35, normal wall motion, grade 1 diastolic dysfunction, PASP 49   Chronic respiratory failure (HCC)    Cluster headache    hx; haven't had one in awhile (01/09/2016)   COPD (chronic obstructive pulmonary disease) (HCC)    thelbert 01/09/2016   DM (diabetes mellitus) (HCC)    History of CVA (cerebrovascular accident)    Hypertension    IDA (iron  deficiency anemia)    Moderate tobacco use disorder    NICM (nonischemic cardiomyopathy) (HCC)    Nicotine  addiction    Peripheral vascular disease (HCC)    Syncope 06/11/2019   Tobacco abuse    Type 2 diabetes mellitus (HCC) 05/14/2016    Assessment: Patient Reported Symptoms: Cognitive Cognitive Status: Able to follow simple  commands, Normal speech and language skills, Alert and oriented to person, place, and time      Neurological Neurological Review of Symptoms: No symptoms reported Neurological Management Strategies: Medication therapy, Routine screening Neurological Self-Management Outcome: 4 (good) Neurological Comment: Patient had a hospital follow up with PCP on 02/24/24, started Fioricet , reports headaches relieved with Fioricet .  HEENT HEENT Symptoms Reported: Nosebleed HEENT Management Strategies: Medication therapy, Routine screening, Coping strategies HEENT Self-Management Outcome: 3 (uncertain) HEENT Comment: Nasal dryness and nosebleeds due to oxygen  use. RNCM discussed saline nasal spray to moisten nasal passages. Patient voiced understanding    Cardiovascular Cardiovascular Symptoms Reported: No symptoms reported Patient's Recent BP reading at home: Patient has not checked BP today Cardiovascular Self-Management Outcome: 4 (good) Cardiovascular Comment: Obtained heart monitor and wearing.  Respiratory Respiratory Symptoms Reported: Productive cough, Shortness of breath Additional Respiratory Details: Patient working on smoking cessation. Recently started on Wellbutrin , has only smoked a couple of cigarettes in 3 days. Patient noticing improvement with his breathing since cutting back. Respiratory Management Strategies: Adequate rest, Oxygen  therapy, BiPAP, Medication therapy, Coping strategies, Routine screening Respiratory Self-Management Outcome: 4 (good)  Endocrine Endocrine Symptoms Reported: Not assessed    Gastrointestinal Gastrointestinal Symptoms Reported: No symptoms reported      Genitourinary Genitourinary Symptoms Reported: Not assessed    Integumentary Integumentary Symptoms Reported: Bruising Skin Management Strategies: Routine screening Skin Self-Management Outcome: 3 (uncertain) Skin Comment: RNCM  advised patient to follow up with Dr. Lanny. Provided office number to schedule  [phone]  Musculoskeletal Musculoskelatal Symptoms Reviewed: Limited mobility Musculoskeletal Management Strategies: Medical device, Routine screening Musculoskeletal Self-Management Outcome: 4 (good) Musculoskeletal Comment: ambulates with walker Falls in the past year?: No    Psychosocial Psychosocial Symptoms Reported: Not assessed         There were no vitals filed for this visit.  Medications Reviewed Today     Reviewed by Lucky Andrea LABOR, RN (Registered Nurse) on 02/28/24 at 1318  Med List Status: <None>   Medication Order Taking? Sig Documenting Provider Last Dose Status Informant  albuterol  (PROVENTIL ) (2.5 MG/3ML) 0.083% nebulizer solution [id number] Yes Take 3 mLs (2.5 mg total) by nebulization every 2 (two) hours as needed for wheezing or shortness of breath. Darlean Ozell NOVAK, MD  Active Self, Pharmacy Records  albuterol  (VENTOLIN  HFA) 108 [phone] Base) MCG/ACT inhaler [id number] Yes INHALE 2 PUFFS INTO THE LUNGS EVERY 6 (SIX) HOURS AS NEEDED FOR WHEEZING OR SHORTNESS OF BREATH. Newlin, Enobong, MD  Active Self, Pharmacy Records  aspirin  EC 81 MG tablet [id number] Yes Take 81 mg by mouth daily. Swallow whole. [provider]  Active Self, Pharmacy Records  atorvastatin  (LIPITOR ) 80 MG tablet [id number] Yes Take 1 tablet (80 mg total) by mouth daily. Newlin, Enobong, MD  Active Self, Pharmacy Records           Med Note MURRAY, KATAWBBA   Wed Dec 11, 2023  1:23 PM)    bisoprolol  (ZEBETA ) 5 MG tablet [id number] Yes Take 1 tablet (5 mg total) by mouth daily. Waymond Cart, MD  Active   budesonide  (PULMICORT ) 0.5 MG/2ML nebulizer solution [id number] Yes Take 2 mLs (0.5 mg total) by nebulization 2 (two) times daily. Waymond Cart, MD  Active   budesonide -glycopyrrolate -formoterol  (BREZTRI  AEROSPHERE) 160-9-4.8 MCG/ACT AERO inhaler [id number] Yes Take 2 puffs first thing in morning and then another 2 puffs about 12 hours later. Darlean Ozell NOVAK, MD  Active Pharmacy Records, Self   buPROPion  (WELLBUTRIN  SR) 150 MG 12 hr tablet [id number] Yes Take 1 tablet (150 mg total) by mouth daily for 3 days, THEN 1 tablet (150 mg total) 2 (two) times daily thereafter for smoking cessation. Newlin, Enobong, MD  Active   butalbital -acetaminophen -caffeine  (FIORICET ) 50-325-40 MG tablet [id number] Yes Take 1 tablet by mouth every 12 (twelve) hours as needed for headache. Newlin, Enobong, MD  Active   DULoxetine  (CYMBALTA ) 60 MG capsule [id number] Yes Take 1 capsule (60 mg total) by mouth daily. For chronic leg pains Delbert Clam, MD  Active   ergocalciferol  (DRISDOL ) 1.25 MG (50000 UT) capsule [id number] Yes Take 1 capsule (50,000 Units total) by mouth once a week. Newlin, Enobong, MD  Active Self, Pharmacy Records  ezetimibe  (ZETIA ) 10 MG tablet [id number] Yes Take 1 tablet (10 mg total) by mouth daily. Okey Vina GAILS, MD  Active Self, Pharmacy Records  ferrous sulfate  [facility]) 325 (65 FE) MG tablet [id number] Yes Take 1 tablet (325 mg total) by mouth 2 (two) times daily with a meal. Delbert Clam, MD  Active Self, Pharmacy Records  fluticasone  (FLONASE ) 50 MCG/ACT nasal spray [id number] Yes Place 2 sprays into both nostrils daily. Newlin, Enobong, MD  Active   folic acid  (FOLVITE ) 1 MG tablet [id number] Yes Take 1 tablet (1 mg total) by mouth daily. Newlin, Enobong, MD  Active Self, Pharmacy Records  furosemide  (LASIX ) 40 MG tablet [id number] Yes Take 1 tablet (40 mg total) by mouth 2 (two) times daily. Newlin, Enobong, MD  Active   gabapentin  (NEURONTIN ) 300 MG capsule [id number] Yes Take 1 capsule (300 mg total) by mouth at bedtime. For leg pains  Patient taking differently: Take 300 mg by mouth 2 (two) times daily. For leg pains   Delbert Clam, MD  Active Self, Pharmacy Records  ipratropium-albuterol  (DUONEB) 0.5-2.5 (3) MG/3ML SOLN [id number] Yes Take 3 mLs by nebulization every 6 (six) hours. Waymond Cart, MD  Active   isosorbide -hydrALAZINE  (BIDIL ) 20-37.5 MG tablet [id number] Yes Take 1  tablet by mouth 3 (three) times daily. Waymond Cart, MD  Active   nitroGLYCERIN  (NITROSTAT ) 0.4 MG SL tablet [id number] Yes Place 0.4 mg under the tongue every 5 (five) minutes as needed for chest pain. [provider]  Active Pharmacy Records, Self  OXYGEN  [id number] Yes Inhale 2 L/min into the lungs continuous.  Patient taking differently: Inhale 4 L/min into the lungs continuous.   [provider]  Active Self, Pharmacy Records  pantoprazole  (PROTONIX ) 40 MG tablet [id number] Yes Take 1 tablet (40 mg total) by mouth daily. Newlin, Enobong, MD  Active   rivaroxaban  (XARELTO ) 20 MG TABS tablet [id number] Yes Take 1 tablet (20 mg total) by mouth daily with supper. Newlin, Enobong, MD  Active   sacubitril -valsartan  (ENTRESTO ) 24-26 MG [id number] Yes Take 1 tablet by mouth 2 (two) times daily. Waymond Cart, MD  Active     Discontinued 04/04/20 1029             Recommendation:   Continue Current Plan of Care  Follow Up Plan:   Telephone follow-up in 1 week  Andrea Dimes RN, BSN [place]  [facility] RN Care Manager [phone]

## 2024-03-02 ENCOUNTER — Ambulatory Visit: Attending: Physician Assistant | Admitting: Physician Assistant

## 2024-03-02 ENCOUNTER — Encounter: Payer: Self-pay | Admitting: Physician Assistant

## 2024-03-02 VITALS — BP 108/84 | HR 72 | Ht 74.0 in | Wt 197.6 lb

## 2024-03-02 DIAGNOSIS — J449 Chronic obstructive pulmonary disease, unspecified: Secondary | ICD-10-CM | POA: Diagnosis not present

## 2024-03-02 DIAGNOSIS — Z01810 Encounter for preprocedural cardiovascular examination: Secondary | ICD-10-CM | POA: Diagnosis not present

## 2024-03-02 DIAGNOSIS — I25119 Atherosclerotic heart disease of native coronary artery with unspecified angina pectoris: Secondary | ICD-10-CM

## 2024-03-02 DIAGNOSIS — E785 Hyperlipidemia, unspecified: Secondary | ICD-10-CM | POA: Diagnosis not present

## 2024-03-02 DIAGNOSIS — I480 Paroxysmal atrial fibrillation: Secondary | ICD-10-CM | POA: Diagnosis not present

## 2024-03-02 DIAGNOSIS — I519 Heart disease, unspecified: Secondary | ICD-10-CM | POA: Diagnosis not present

## 2024-03-02 DIAGNOSIS — I10 Essential (primary) hypertension: Secondary | ICD-10-CM | POA: Diagnosis not present

## 2024-03-02 DIAGNOSIS — Z8673 Personal history of transient ischemic attack (TIA), and cerebral infarction without residual deficits: Secondary | ICD-10-CM | POA: Diagnosis not present

## 2024-03-02 DIAGNOSIS — I471 Supraventricular tachycardia, unspecified: Secondary | ICD-10-CM | POA: Diagnosis not present

## 2024-03-02 NOTE — Progress Notes (Unsigned)
 Cardiology Office Note   Date:  03/03/2024  ID:  [name], DOB Oct 13, 1955, MRN [id number] PCP: Delbert Clam, MD  [facility] Providers Cardiologist:  Vina Gull, MD     History of Present Illness Jared [name] is a 68 y.o. male with past medical history of CAD, chronic combined systolic and diastolic heart failure, hypertension, hyperlipidemia, history of CVA, PAF on Xarelto , COPD on chronic home O2 and tobacco abuse.  Patient had a mild CAD by cath in 2012.  Myoview in 2016 showed no ischemia.  EF in 2018 was 30 to 35%.  By November 2019, ejection fraction improved to 45 to 50%.  He had PTCA/stent to left femoral-popliteal artery in June 2024.  He had anemia and was seen in the ER afterward and underwent EGD that showed no active bleeding.  He received iron  transfusion.  He was admitted with sepsis secondary to ruptured appendix in November 2024 and underwent laparoscopic appendectomy surgery by Dr. Ebbie.  Echocardiogram obtained on 04/30/2023 showed EF 55 to 60%, grade 2 DD, moderately reduced RV, no significant valve issue.  He was admitted to the hospital in February due to critical limb ischemia and underwent lower extremity angiography by Dr. Marylouise on 08/02/2023 which revealed stent malfunction in mid SFA due to the previous stent prolapsing on itself causing a functional stenosis, this was treated with a covered stent.  Patient was discharged with aspirin , Plavix  and Eliquis after the procedure.  Unfortunately, he had symptomatic anemia on triple therapy and was admitted in March 2025 with a hemoglobin of 6.1. Although EKG at the time was interpreted as A-fib, however on closer examination, it appears patient was in sinus tachycardia with PACs. He required 3 units of packed red blood cell transfusion.  Endoscopy showed 3 nonbleeding angiectasias treated with APC.  Plavix  was discontinued upon discharge.  Patient was left on aspirin  and Xarelto . He was seen in the ED again on  11/13/2023 due to symptomatic anemia, hemoglobin dropped down to 6.1.  Patient underwent blood transfusion in the emergency room and discharged to have periodic outpatient blood transfusion.  He underwent repeat EGD on 11/19/2023 that showed short segment of Barrett's esophagus, 2 nonbleeding angiectasia in duodenum treated with APC, 2 nonbleeding angioectasias in the jejunum also treated with APC, small superficial mucosal disruption seen in the fourth portion of the duodenum which was clipped.  He had another 2 units of blood transfusion on 6/30.    I last saw the patient in July 2025, blood pressure was borderline low, I reduced his hydralazine  to 25 mg 3 times a day.  His heart rate was elevated with EKG showing sinus tachycardia with PACs.  He was on 3 L oxygen  24/7.  Patient was admitted in late August with acute respiratory failure.  He was treated for COPD exacerbation.  Cardiology service consulted for SVT during the hospitalization.  Echocardiogram obtained on 02/10/2024 showed mildly reduced EF of 45%, global hypokinesis, mild eccentric LVH, grade 2 DD, elevated left atrial pressure, moderately dilated left and right atrium, mild dilatation of the ascending aorta measuring 44 mm.  Metoprolol  succinate was stopped and switched to bisoprolol  in the setting of COPD exacerbation.  Entresto  was added.  Hydralazine  and Imdur  was consolidated to BiDil  EP was curb sided during the hospitalization, no current plan for outpatient follow-up at this time.  A 14-day heart monitor was ordered to assess the frequency of PVCs.  Patient presents today for posthospital follow-up.  The heart monitor result is  not available yet.  He is on appropriate heart failure medication.  We plan to recheck echocardiogram in 2 to 3 months to see if EF improved.  He is complaining of almost daily episode of chest discomfort, sometimes multiple times throughout the day.  Chest discomfort happens more so with activity and less so at rest.   He does not do much activity due to significant underlying COPD.  He is not a great candidate for stress testing given severe COPD.  He is also not a good candidate for any invasive workup as he has high bleeding risk on triple therapy.  I am not confident I can slow her heart rate well enough for the coronary CTA.  I discussed his case with DOD Dr. [place], we will continue with medical therapy since patient is not a good candidate for invasive study.  He has upcoming colonoscopy procedure, he would be at least moderate or high risk patient going for low risk procedure.  He is at acceptable risk to proceed from the cardiac perspective, however we will need to hold Xarelto  for 2 days prior to the colonoscopy and will need pulmonary evaluation for pulmonary clearance prior to the procedure.   ROS:   Patient complains of intermittent chest discomfort, shortness of breath with activity.  He has no lower extremity edema, orthopnea or PND.  Studies Reviewed      Cardiac Studies & Procedures   ______________________________________________________________________________________________     ECHOCARDIOGRAM  ECHOCARDIOGRAM COMPLETE 02/10/2024  Narrative ECHOCARDIOGRAM REPORT    Patient Name:   Jared [name] Date of Exam: 02/10/2024 Medical Rec #:  [id number]    Height:       73.0 in Accession #:    [id number]   Weight:       200.0 lb Date of Birth:  02-Jul-1955     BSA:          2.152 m Patient Age:    68 years     BP:           132/78 mmHg Patient Gender: M            HR:           72 bpm. Exam Location:  Inpatient  Procedure: 2D Echo, Cardiac Doppler and Color Doppler (Both Spectral and Color Flow Doppler were utilized during procedure).  Indications:    Dyspnea R06.00  History:        Patient has prior history of Echocardiogram examinations, most recent 05/02/2023. CHF and Cardiomyopathy, CAD, COPD, Arrythmias:Atrial Fibrillation, Atrial Flutter and Tachycardia, Signs/Symptoms:Syncope; Risk  Factors:Hypertension, Diabetes and Current Smoker.  Sonographer:    Thea Norlander RCS Referring Phys: [id number] GRACE LAU  IMPRESSIONS   1. Left ventricular ejection fraction, by estimation, is 45%. The left ventricle has mildly decreased function. The left ventricle demonstrates global hypokinesis. The left ventricular internal cavity size was mildly dilated. There is mild eccentric left ventricular hypertrophy. Left ventricular diastolic parameters are consistent with Grade II diastolic dysfunction (pseudonormalization). Elevated left atrial pressure. 2. Right ventricular systolic function is normal. The right ventricular size is mildly enlarged. Tricuspid regurgitation signal is inadequate for assessing PA pressure. 3. Left atrial size was moderately dilated. 4. Right atrial size was moderately dilated. 5. A small pericardial effusion is present. The pericardial effusion is circumferential. There is no evidence of cardiac tamponade. 6. The mitral valve is normal in structure. Trivial mitral valve regurgitation. No evidence of mitral stenosis. 7. The aortic valve is tricuspid. There is mild calcification  of the aortic valve. There is mild thickening of the aortic valve. Aortic valve regurgitation is not visualized. Aortic valve sclerosis/calcification is present, without any evidence of aortic stenosis. 8. Aortic dilatation noted. There is borderline dilatation of the aortic root, measuring 38 mm. There is mild dilatation of the ascending aorta, measuring 44 mm. 9. The inferior vena cava is dilated in size with <50% respiratory variability, suggesting right atrial pressure of 15 mmHg.  Comparison(s): No significant change from prior study. Prior images reviewed side by side. The left ventricular function is unchanged. Left ventricular systolic function appears to be overestimated on the 05/02/2023 study, but is similar to the 12/13/2021 report.  FINDINGS Left Ventricle: Left ventricular  ejection fraction, by estimation, is 45%. The left ventricle has mildly decreased function. The left ventricle demonstrates global hypokinesis. The left ventricular internal cavity size was mildly dilated. There is mild eccentric left ventricular hypertrophy. Left ventricular diastolic function could not be evaluated due to atrial fibrillation. Left ventricular diastolic parameters are consistent with Grade II diastolic dysfunction (pseudonormalization). Elevated left atrial pressure.  Right Ventricle: The right ventricular size is mildly enlarged. No increase in right ventricular wall thickness. Right ventricular systolic function is normal. Tricuspid regurgitation signal is inadequate for assessing PA pressure.  Left Atrium: Left atrial size was moderately dilated.  Right Atrium: Right atrial size was moderately dilated.  Pericardium: A small pericardial effusion is present. The pericardial effusion is circumferential. There is no evidence of cardiac tamponade.  Mitral Valve: The mitral valve is normal in structure. Mild mitral annular calcification. Trivial mitral valve regurgitation. No evidence of mitral valve stenosis.  Tricuspid Valve: The tricuspid valve is normal in structure. Tricuspid valve regurgitation is not demonstrated.  Aortic Valve: The aortic valve is tricuspid. There is mild calcification of the aortic valve. There is mild thickening of the aortic valve. Aortic valve regurgitation is not visualized. Aortic valve sclerosis/calcification is present, without any evidence of aortic stenosis. Aortic valve peak gradient measures 9.7 mmHg.  Pulmonic Valve: The pulmonic valve was normal in structure. Pulmonic valve regurgitation is not visualized. No evidence of pulmonic stenosis.  Aorta: Aortic dilatation noted. There is borderline dilatation of the aortic root, measuring 38 mm. There is mild dilatation of the ascending aorta, measuring 44 mm.  Venous: The inferior vena cava is  dilated in size with less than 50% respiratory variability, suggesting right atrial pressure of 15 mmHg.  IAS/Shunts: No atrial level shunt detected by color flow Doppler.   LEFT VENTRICLE PLAX 2D LVIDd:         5.80 cm      Diastology LVIDs:         4.60 cm      LV e' medial:    6.45 cm/s LV PW:         1.15 cm      LV E/e' medial:  19.4 LV IVS:        1.15 cm      LV e' lateral:   7.73 cm/s LVOT diam:     2.00 cm      LV E/e' lateral: 16.2 LV SV:         64 LV SV Index:   30 LVOT Area:     3.14 cm  LV Volumes (MOD) LV vol d, MOD A2C: 121.0 ml LV vol d, MOD A4C: 115.0 ml LV vol s, MOD A2C: 80.7 ml LV vol s, MOD A4C: 77.1 ml LV SV MOD A2C:     40.3  ml LV SV MOD A4C:     43.9 ml LV SV MOD BP:      39.9 ml  RIGHT VENTRICLE             IVC RV S prime:     16.00 cm/s  IVC diam: 3.30 cm TAPSE (M-mode): 2.5 cm  LEFT ATRIUM             Index        RIGHT ATRIUM           Index LA diam:        4.70 cm 2.18 cm/m   RA Area:     28.60 cm LA Vol (A2C):   85.4 ml 39.69 ml/m  RA Volume:   101.50 ml 47.17 ml/m LA Vol (A4C):   79.6 ml 37.00 ml/m LA Biplane Vol: 79.1 ml 36.76 ml/m AORTIC VALVE AV Area (Vmax): 2.25 cm AV Vmax:        156.00 cm/s AV Peak Grad:   9.7 mmHg LVOT Vmax:      111.67 cm/s LVOT Vmean:     70.200 cm/s LVOT VTI:       0.205 m  AORTA Ao Root diam: 3.80 cm Ao Asc diam:  4.40 cm  MITRAL VALVE MV Area (PHT): 5.27 cm     SHUNTS MV Decel Time: 144 msec     Systemic VTI:  0.20 m MV E velocity: 125.00 cm/s  Systemic Diam: 2.00 cm MV A velocity: 41.90 cm/s MV E/A ratio:  2.98  Mihai Croitoru MD Electronically signed by Jerel Balding MD Signature Date/Time: 02/10/2024/4:58:48 PM    Final          ______________________________________________________________________________________________      Risk Assessment/Calculations  CHA2DS2-VASc Score = 6   This indicates a 9.7% annual risk of stroke. The patient's score is based upon: CHF History:  1 HTN History: 1 Diabetes History: 0 Stroke History: 2 Vascular Disease History: 1 Age Score: 1 Gender Score: 0           Physical Exam VS:  BP 108/84   Pulse 72   Ht 6' 2 (1.88 m)   Wt 197 lb 9.6 oz (89.6 kg)   SpO2 91%   BMI 25.37 kg/m        Wt Readings from Last 3 Encounters:  03/02/24 197 lb 9.6 oz (89.6 kg)  02/24/24 205 lb 6.4 oz (93.2 kg)  02/14/24 204 lb (92.5 kg)    GEN: Well nourished, well developed in no acute distress NECK: No JVD; No carotid bruits CARDIAC: RRR, no murmurs, rubs, gallops RESPIRATORY:  Clear to auscultation without rales, wheezing or rhonchi  ABDOMEN: Soft, non-tender, non-distended EXTREMITIES:  No edema; No deformity   ASSESSMENT AND PLAN  LV dysfunction: Patient has a history of HFmrEF.  Recent echocardiogram showed EF dropped to 45%.  GDMT titrated.  Will repeat echocardiogram in 2 to 3 months  CAD: Previous cardiac catheterization in 2012 showed nonobstructive CAD.  During previous office visit, patient denied any chest discomfort, however during today's visit, he says he has been having chest pain for years as occurring on a daily basis.  He gets short of breath walking very short distance due to underlying COPD, however he think the chest discomfort is worse with activity.  He is not a good candidate for stress testing due to significant COPD on oxygen .  I am not confident I can slow down his heart rate enough to do a coronary CTA.  He is  not a good candidate for a invasive study such as cardiac catheterization as he had recurrent GI bleed on dual antiplatelet therapy that was prescribed by vascular surgery in the past.  Will continue observation for now  Hypertension: Blood pressure well-controlled.  Unable to further uptitrate GDMT due to borderline blood pressure  Hyperlipidemia: On atorvastatin  80 mg daily  COPD: On oxygen .  Recent COPD exacerbation  History of CVA: No recent recurrence  Preop clearance: Upcoming colonoscopy  procedure.  I reviewed his case with DOD Dr. [place], although he has daily episode of chest pain, he is not a good candidate for invasive study.  Fortunately, upcoming colonoscopy is a very low risk procedure.  Patient will be at least a moderate risk patient going for low risk procedure.  He will need to hold the Xarelto  for 2 days prior to the procedure and restart once possible afterward at the GI physician's discretion.  History of SVT: Cardiology service was consulted during the recent hospitalization due to SVT in the setting of COPD exacerbation.  He is wearing a heart monitor, result is not available at this time.  Previous metoprolol  has been switched to bisoprolol  during the recent hospitalization.         Dispo: Follow-up in 3 months, earlier if heart monitor came back abnormal  Signed, Scot Ford, [place]

## 2024-03-02 NOTE — Patient Instructions (Addendum)
 Thank you for choosing [facility]!     Medication Instructions:  HOLD XARELTO  2 DAYS PRIOR TO THE COLONOSCOPY.   *If you need a refill on your cardiac medications before your next appointment, please call your pharmacy*   Lab Work: No labs were ordered during today's visit.  If you have labs (blood work) drawn today and your tests are completely normal, you will receive your results only by: MyChart Message (if you have MyChart) OR A paper copy in the mail If you have any lab test that is abnormal or we need to change your treatment, we will call you to review the results.   Testing/Procedures: Your physician has requested that you have an echocardiogram IN 2-3 MONTHS. Echocardiography is a painless test that uses sound waves to create images of your heart. It provides your doctor with information about the size and shape of your heart and how well your heart's chambers and valves are working. This procedure takes approximately one hour. There are no restrictions for this procedure. Please do NOT wear cologne, perfume, aftershave, or lotions (deodorant is allowed). Please arrive 15 minutes prior to your appointment time.  Please note: We ask at that you not bring children with you during ultrasound (echo/ vascular) testing. Due to room size and safety concerns, children are not allowed in the ultrasound rooms during exams. Our front office staff cannot provide observation of children in our lobby area while testing is being conducted. An adult accompanying a patient to their appointment will only be allowed in the ultrasound room at the discretion of the ultrasound technician under special circumstances. We apologize for any inconvenience.   Your next appointment:   3 month(s)   Provider:   Vina Gull, MD AFTER ECHO. IF NOT AVAILABLE, CAN SEE HOA MENG.    Follow-Up: At [facility], you and your health needs are our priority.  As part of our continuing mission to  provide you with exceptional heart care, we have created designated Provider Care Teams.  These Care Teams include your primary Cardiologist (physician) and Advanced Practice Providers (APPs -  Physician Assistants and Nurse Practitioners) who all work together to provide you with the care you need, when you need it. We recommend signing up for the patient portal called MyChart.  Sign up information is provided on this After Visit Summary.  MyChart is used to connect with patients for Virtual Visits (Telemedicine).  Patients are able to view lab/test results, encounter notes, upcoming appointments, etc.  Non-urgent messages can be sent to your provider as well.   To learn more about what you can do with MyChart, go to [email or web address].

## 2024-03-05 ENCOUNTER — Other Ambulatory Visit: Payer: Self-pay | Admitting: Hematology

## 2024-03-05 ENCOUNTER — Telehealth: Payer: Self-pay | Admitting: Hematology

## 2024-03-05 ENCOUNTER — Telehealth: Payer: Self-pay

## 2024-03-05 ENCOUNTER — Other Ambulatory Visit: Payer: Self-pay

## 2024-03-05 NOTE — Telephone Encounter (Signed)
 Called pt and he is scheduled per Dr. Lanny. Pt is aware is of his appt.

## 2024-03-05 NOTE — Telephone Encounter (Signed)
 Received telephone call from the patient inquiring his appointments.  Patient did not have any future appointments scheduled w/ [facility]. Transferred patient's call to DB-scheduler to further assist.

## 2024-03-06 ENCOUNTER — Other Ambulatory Visit: Payer: Self-pay | Admitting: *Deleted

## 2024-03-06 NOTE — Patient Instructions (Signed)
 Visit Information  Thank you for taking time to visit with me today. Please don't hesitate to contact me if I can be of assistance to you before our next scheduled telephone appointment.   Following is a copy of your care plan:   Goals Addressed             This Visit's Progress    VBCI Transitions of Care (TOC) Care Plan       Problems:  Recent Hospitalization for treatment of COPD Medication management barrier Patient seems overwhelmed with medication management  Goal:  Over the next 30 days, the patient will not experience hospital readmission  Interventions:  Transitions of Care: Doctor Visits  - discussed the importance of doctor visits Post discharge activity limitations prescribed by provider reviewed Reviewed Signs and symptoms of infection Medication Review, verified patient has obtained new medications Reviewed upcoming appointments including:Pulmonology on 03/09/24  and Oncology 03/18/24 Provided patient with the number to schedule a follow up with Dr. Lanny [phone] to discuss plan for continued infusions-scheduled on 03/18/24  COPD Interventions: Advised patient to engage in light exercise as tolerated 3-5 days a week to aid in the the management of COPD Discussed the importance of adequate rest and management of fatigue with COPD Provided education about and advised patient to utilize infection prevention strategies to reduce risk of respiratory infection Use of home oxygen  Discussed the importance of smoking cessation-patient continues to take wellbutrin  and working to cut back on smoking Advised using saline nasal spray to moisten nasal passages  Patient Self Care Activities:  Attend all scheduled provider appointments Call pharmacy for medication refills 3-7 days in advance of running out of medications Call provider office for new concerns or questions  Notify RN Care Manager of TOC call rescheduling needs Participate in Transition of Care Program/Attend  TOC scheduled calls Take medications as prescribed   do breathing exercises every day eliminate symptom triggers at home keep follow-up appointments: PCP on 02/24/24, Pulmonology on 03/09/24 and Cardiology on 03/13/24 eat healthy/prescribed diet: low sodium, heart healthy do breathing exercises every day Use Bipap nightly  Plan:  Telephone follow up appointment with care management team member scheduled for:  03/13/24 at 2:15 pm        Patient verbalizes understanding of instructions and care plan provided today and agrees to view in MyChart. Active MyChart status and patient understanding of how to access instructions and care plan via MyChart confirmed with patient.     Telephone follow up appointment with care management team member scheduled for:03/13/24 at 2:15pm  Please call the care guide team at [phone] if you need to cancel or reschedule your appointment.   Please call [phone]-TALK (toll free, 24 hour hotline) go to [facility] [address] [place] [phone]) call 911 if you are experiencing a Mental Health or Behavioral Health Crisis or need someone to talk to.  Andrea Dimes RN, BSN [place]  [facility] RN Care Manager [phone]

## 2024-03-06 NOTE — Transitions of Care (Post Inpatient/ED Visit) (Signed)
 Transition of Care week 4  Visit Note  03/06/2024  Name: Jared [name] MRN: [id number]          DOB: Nov 27, 1955  Situation: Patient enrolled in [facility] 30-day program. Visit completed with Mr. Proia by telephone.   Background:   Initial Transition Care Management Follow-up Telephone Call    Past Medical History:  Diagnosis Date   Acute blood loss anemia 05/18/2023   Acute perforated appendicitis 04/30/2023   AKI (acute kidney injury) 11/18/2020   Atrial fibrillation with RVR (HCC) 05/06/2023   AVM (arteriovenous malformation)    CAD (coronary artery disease)    a. LHC 5/12:  LAD 20, pLCx 20, pRCA 40, dRCA 40, EF 35%, diff HK  //  b. Myoview 4/16: Overall Impression:  High risk stress nuclear study There is no evidence of ischemia.  There is severe LV dysfunction. LV Ejection Fraction: 30%.  LV Wall Motion:  There is global LV hypokinesis.     CAP (community acquired pneumonia) 09/2013   Chronic combined systolic and diastolic CHF (congestive heart failure) (HCC)    a. Echo 4/16:Mild LVH, EF 40-45%, diffuse HK //  b. Echo 8/17: EF 35-40%, diffuse HK, diastolic dysfunction, aortic sclerosis, trivial MR, moderate LAE, normal RVSF, moderate RAE, mild TR, PASP 42 mmHg // c. Echo 4/18: Mild concentric LVH, EF 30-35, normal wall motion, grade 1 diastolic dysfunction, PASP 49   Chronic respiratory failure (HCC)    Cluster headache    hx; haven't had one in awhile (01/09/2016)   COPD (chronic obstructive pulmonary disease) (HCC)    thelbert 01/09/2016   DM (diabetes mellitus) (HCC)    History of CVA (cerebrovascular accident)    Hypertension    IDA (iron  deficiency anemia)    Moderate tobacco use disorder    NICM (nonischemic cardiomyopathy) (HCC)    Nicotine  addiction    Peripheral vascular disease    Syncope 06/11/2019   Tobacco abuse    Type 2 diabetes mellitus (HCC) 05/14/2016    Assessment: Patient Reported Symptoms: Cognitive Cognitive Status: Able to follow simple commands,  Normal speech and language skills, Alert and oriented to person, place, and time      Neurological Neurological Review of Symptoms: No symptoms reported    HEENT HEENT Symptoms Reported: No symptoms reported      Cardiovascular Cardiovascular Symptoms Reported: No symptoms reported Patient's Recent BP reading at home: Patient has not checked BP today Cardiovascular Self-Management Outcome: 4 (good) Cardiovascular Comment: Mailed in heart monitor today.  Respiratory Additional Respiratory Details: Patient continues to work on smoking cessation. Smoking 1/2pk cig/daily now, taking wellbutrin  Respiratory Management Strategies: Routine screening, Oxygen  therapy, Breathing exercise, BiPAP, Coping strategies, Medication therapy, Adequate rest Respiratory Self-Management Outcome: 3 (uncertain)  Endocrine Endocrine Symptoms Reported: Not assessed    Gastrointestinal Gastrointestinal Symptoms Reported: No symptoms reported      Genitourinary Genitourinary Symptoms Reported: Not assessed    Integumentary Integumentary Symptoms Reported: No symptoms reported    Musculoskeletal   Musculoskeletal Self-Management Outcome: 4 (good) Musculoskeletal Comment: ambulates with walker      Psychosocial Psychosocial Symptoms Reported: Not assessed         There were no vitals filed for this visit.  Medications Reviewed Today     Reviewed by Lucky Andrea LABOR, RN (Registered Nurse) on 03/06/24 at 1321  Med List Status: <None>   Medication Order Taking? Sig Documenting Provider Last Dose Status Informant  albuterol  (PROVENTIL ) (2.5 MG/3ML) 0.083% nebulizer solution [id number] Yes Take 3 mLs (2.5 mg  total) by nebulization every 2 (two) hours as needed for wheezing or shortness of breath. Darlean Ozell NOVAK, MD  Active Self, Pharmacy Records  albuterol  (VENTOLIN  HFA) 108 [phone] Base) MCG/ACT inhaler [id number] Yes INHALE 2 PUFFS INTO THE LUNGS EVERY 6 (SIX) HOURS AS NEEDED FOR WHEEZING OR SHORTNESS OF BREATH.  Newlin, Enobong, MD  Active Self, Pharmacy Records  aspirin  EC 81 MG tablet [id number] Yes Take 81 mg by mouth daily. Swallow whole. [provider]  Active Self, Pharmacy Records  atorvastatin  (LIPITOR ) 80 MG tablet [id number] Yes Take 1 tablet (80 mg total) by mouth daily. Newlin, Enobong, MD  Active Self, Pharmacy Records           Med Note MURRAY, KATAWBBA   Wed Dec 11, 2023  1:23 PM)    bisoprolol  (ZEBETA ) 5 MG tablet [id number] Yes Take 1 tablet (5 mg total) by mouth daily. Waymond Cart, MD  Active   budesonide  (PULMICORT ) 0.5 MG/2ML nebulizer solution [id number] Yes Take 2 mLs (0.5 mg total) by nebulization 2 (two) times daily. Waymond Cart, MD  Active   budesonide -glycopyrrolate -formoterol  (BREZTRI  AEROSPHERE) 160-9-4.8 MCG/ACT AERO inhaler [id number] Yes Take 2 puffs first thing in morning and then another 2 puffs about 12 hours later. Darlean Ozell NOVAK, MD  Active Pharmacy Records, Self  buPROPion  (WELLBUTRIN  SR) 150 MG 12 hr tablet [id number] Yes Take 1 tablet (150 mg total) by mouth daily for 3 days, THEN 1 tablet (150 mg total) 2 (two) times daily thereafter for smoking cessation. Newlin, Enobong, MD  Active   butalbital -acetaminophen -caffeine  (FIORICET ) 50-325-40 MG tablet [id number] Yes Take 1 tablet by mouth every 12 (twelve) hours as needed for headache. Newlin, Enobong, MD  Active   DULoxetine  (CYMBALTA ) 60 MG capsule [id number] Yes Take 1 capsule (60 mg total) by mouth daily. For chronic leg pains Delbert Clam, MD  Active   ergocalciferol  (DRISDOL ) 1.25 MG (50000 UT) capsule [id number] Yes Take 1 capsule (50,000 Units total) by mouth once a week. Newlin, Enobong, MD  Active Self, Pharmacy Records  ezetimibe  (ZETIA ) 10 MG tablet [id number] Yes Take 1 tablet (10 mg total) by mouth daily. Okey Vina GAILS, MD  Active Self, Pharmacy Records  ferrous sulfate  [facility]) 325 (65 FE) MG tablet [id number] Yes Take 1 tablet (325 mg total) by mouth 2 (two) times daily with a meal. Delbert Clam, MD   Active Self, Pharmacy Records  fluticasone  (FLONASE ) 50 MCG/ACT nasal spray [id number] Yes Place 2 sprays into both nostrils daily. Newlin, Enobong, MD  Active   folic acid  (FOLVITE ) 1 MG tablet [id number] Yes Take 1 tablet (1 mg total) by mouth daily. Newlin, Enobong, MD  Active Self, Pharmacy Records  furosemide  (LASIX ) 40 MG tablet [id number] Yes Take 1 tablet (40 mg total) by mouth 2 (two) times daily. Newlin, Enobong, MD  Active   gabapentin  (NEURONTIN ) 300 MG capsule [id number] Yes Take 1 capsule (300 mg total) by mouth at bedtime. For leg pains Delbert Clam, MD  Active Self, Pharmacy Records  ipratropium-albuterol  (DUONEB) 0.5-2.5 (3) MG/3ML SOLN [id number] Yes Take 3 mLs by nebulization every 6 (six) hours. Waymond Cart, MD  Active   isosorbide -hydrALAZINE  (BIDIL ) 20-37.5 MG tablet [id number] Yes Take 1 tablet by mouth 3 (three) times daily. Waymond Cart, MD  Active   nitroGLYCERIN  (NITROSTAT ) 0.4 MG SL tablet [id number] Yes Place 0.4 mg under the tongue every 5 (five) minutes as needed for chest pain. [provider]  Active Pharmacy Records, Self  OXYGEN  [id number] Yes Inhale 2 L/min into the lungs  continuous.  Patient taking differently: Inhale 4 L/min into the lungs continuous.   [provider]  Active Self, Pharmacy Records  pantoprazole  (PROTONIX ) 40 MG tablet [id number] Yes Take 1 tablet (40 mg total) by mouth daily. Newlin, Enobong, MD  Active   rivaroxaban  (XARELTO ) 20 MG TABS tablet [id number] Yes Take 1 tablet (20 mg total) by mouth daily with supper. Newlin, Enobong, MD  Active   sacubitril -valsartan  (ENTRESTO ) 24-26 MG [id number] Yes Take 1 tablet by mouth 2 (two) times daily. Waymond Cart, MD  Active     Discontinued 04/04/20 1029             Recommendation:   Continue Current Plan of Care  Follow Up Plan:   Telephone follow-up in 1 week  Andrea Dimes RN, BSN [place]  [facility] RN Care Manager [phone]

## 2024-03-08 NOTE — Progress Notes (Unsigned)
 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 worse health since 2014 when arrived in [place] with doe > dx chf/ copd much worse 2017  With GOLD III criteria established 05/2016    Admit date: 01/09/2016 Discharge date: 01/13/2016  Discharge Diagnoses:  Acute respiratory failure secondary to CHF exacerbation -O2 sats upon admission 89% -CXR unremarkable for infection -Responded well to IV lasix -->po lasix  -Weaned off of O2-SpO2 95% on room air   Acute combined systolic/diastolic heart failure -BNP 2056.8 -Echocardiogram 09/20/2014 showed EF 40-45% -Transition to by mouth Lasix  20 mg daily -Echocardiogram 01/10/2016: EF 35-40%, diastolic dysfunction --AST 147, ALT 110 upon admission -Currently trending downward -hepatitis panel unremarkable -Patient denies drug/alcohol use -switch metoprolol -->coreg  -continue losartan      History of Present Illness  02/29/2016 1st [facility] office visit/ Allycia Pitz  symb 160 2bid/ bid saba (not prn)  Chief Complaint  Patient presents with   Pulmonary Consult    Pt referred by Glendia Ferrier, PA, for cough, hemoptysis, and emphysema. Pt c/o hemoptysis x 3 weeks, pt states this occurs daily but has been improving. Pt c/o increase in SOB with activity and rest.   acute  onset epistaxis/ hemoptyis end of Auguest > ER eval 02/20/16 dx RLL pna by CTa chest > rx levaquin / prednisone  and minimal hemoptysis now  Doe = MMRC2 = can't walk a nl pace on a flat grade s sob but does fine slow and flat eg [employer] shopping rec Plan A = Automatic =  Symbicort  160 Take 2 puffs first thing in am and then another 2 puffs about 12 hours later.  Work on inhaler technique: Plan B = Backup Only use your albuterol  as a rescue medication\ Plan C = Crisis - only use your albuterol  nebulizer if you first try Plan B  and it fails to help > ok to use the nebulizer up to every 4 hours but if start needing it regularly call for immediate appointment  The key is to stop smoking completely before smoking completely stops you!  Please schedule a follow up office visit in 2 weeks, sooner if needed with cxr on return > did not return   Admit date: 05/04/2016 Discharge date: 05/09/2016     DISCHARGE DIAGNOSES:  Principal Problem:   Hemoptysis   Community acquired pneumonia of right lower lobe of lung (HCC) - - FOB RLL tbbx ATYPICAL PNEUMOCYTES PROLIFERATION/ LLL BAL 05/08/16    Hypertension   COPD with acute exacerbation (HCC)   Nonischemic dilated cardiomyopathy (HCC)   Cigarette smoker   Chronic systolic CHF (congestive heart failure) (HCC)   06/03/16 to ER> only better while on neb    06/26/2016  f/u ov/Shirley Decamp re:  GOLD III copd / maint symb 160 / saba  Chief Complaint  Patient presents with   Follow-up    pt had CT today. pt states breathing has worsen since last OV. pt c/o increased sob with exertion, prod cough with white mucus, wheezing & chest tightness,  breathing was worse w/in 2 days of last ov / then ran out of lasix  one week ago and swelling started to get worse Also worse orthopnea ? Related to leg swelling in terms of timing but pt not sure  Doe now = MMRC3 = can't walk 100 yards even at  a slow pace at a flat grade s stopping due to sob  rec Bisoprolol  5 mg one daily in place of the twice daily corevidol and resume your lasix  and potassium  Plan A = Automatic = stop symbicort  and start bevespi  Take 2 puffs first thing in am and then another 2 puffs about 12 hours later.  Plan B = Backup Only use your albuterol  as a rescue medication The key is to stop smoking completely before smoking completely stops you!  Please schedule a follow up office visit in 4 weeks, sooner if needed bring all meds         07/04/2022  f/u ov/Noriel Guthrie re: GOLD 3/02 dep  maint on breztri    Chief Complaint  Patient  presents with   Follow-up    Some cough and still SOB with exertion.   Dyspnea:  room to room at home,  now limited by L knee pain  Cough: min am mucoid  Sleeping: flat bed one pillow SABA use: not using pre ex 02: 2lpm 25/7 does not check with ex  Covid status:   vax x 4 or 5 / never infected  Lung cancer screening :  in program   Rec Dr Vernetta is your orthopedic surgeon Plan A = Automatic = Always= Breztri  Take 2 puffs first thing in am and then another 2 puffs about 12 hours later.   Plan B = Backup (to supplement plan A, not to replace it) Only use your albuterol  inhaler as a rescue medication  Plan C = Crisis (instead of Plan B but only if Plan B stops working) - only use your albuterol  nebulizer if you first try Plan B  Make sure you check your oxygen  saturation  at your highest level of activity  to be sure it stays over 90%  We will refer you again for best fit for portable oxygen  for [employer] - please write the name and number of whomever you talk to there if you are not satisfied        07/04/2023  f/u ov/Janautica Netzley re: COPD GOLD 3/ 02 dep  maint on breztri   / still smoking  Chief Complaint  Patient presents with   Follow-up    In hospital for COPD and appendix rupture.  C/o SOB persistent   Dyspnea:  rollator  just leaves home to go to doctor / L leg claudication  Cough: none  Sleeping: flat bed one pillow s  resp cc  SABA use: rarely hfa/ neb  2-3 x per month 02: 2lpm conc hs and resting/  3lpm POC for ambulation  Lung cancer screening :  aug 2025   Rec No change rx     DOB: 1956/05/16  PCP: Delbert Clam, MD   Date of Admission: 02/09/2024    Date of Discharge: 02/13/2024  Attending Physician: Dr. Dayton Eastern   Discharge Diagnosis: Principal Problem:   COPD with acute exacerbation (HCC)   CAD (coronary artery disease)   Cigarette smoker   Type 2 diabetes mellitus (HCC)   Chronic respiratory failure with hypoxia (HCC)   Iron  deficiency anemia due to chronic  blood loss   Peripheral vascular disease (HCC)   PAF (paroxysmal atrial fibrillation) (HCC)   Heart failure with improved ejection fraction (HFimpEF) (HCC)   Hypercarbia   SVT (supraventricular tachycardia) (HCC)   1.  Follow-up:             a.  COPD exacerbation - the patient was recently treated for COPD exacerbation likely  in the setting of continued smoking.  We started him on new nebulizer treatments and ordered him a BiPAP machine which he is to use at night.  He has a follow-up appointment with Dr. Darlean already scheduled.                          b.  Paroxysmal SVT - the patient had episodes of SVT during this admission, likely in the setting of LABA nebulizers and potentially multifocal atrial tachycardia.  We switched his metoprolol  to bisoprolol  at the recommendations of her cardiologist.  We initially discontinued his LABA nebulizers, but our pulmonologist do not think this was a contributory factor.  He was discharged with cardiac monitoring to capture any further SVT episodes.  He has a follow-up appointment scheduled cardiology.               c.  CAD - the patient was complaining of chest tightness and had elevated troponins without EKG changes.  We continue to send medications please ensure the patient is continuing his home medications.               d. HFmrEF - patient was complaining of shortness of breath but was euvolemic.  We thought this was more so in the setting of COPD exacerbation.  New TTE showing LVEF of 45% with global hypokinesis.  We added Entresto  and switched his hydralazine  and Imdur  to BiDil  at the recommendations of cardiologist.  Please ensure that the patient is taking his medications.                03/09/2024  Post Hosp f/u ov/Kallen Delatorre re: COPD GOLD 3/ 02 dep    maint on Breztri  x 2  / still smoking  Chief Complaint  Patient presents with   Hospitalization Follow-up    Doing okay.  Hospital follow up.  Review LCS CT scan   Dyspnea:  mostly just see  doctors o/w stays home  Cough: mild rattle worse in am x one hour > min mucoid  Sleeping: flat bed one pillow s  resp cc  SABA use: neb 3 x daily  02: 4lpm 24/7  LCS : due f/u CT  by 04/2024  No obvious day to day or daytime variability or assoc  purulent sputum or mucus plugs or hemoptysis or cp or chest tightness, subjective wheeze or overt sinus or hb symptoms.    Also denies any obvious fluctuation of symptoms with weather or environmental changes or other aggravating or alleviating factors except as outlined above   No unusual exposure hx or h/o childhood pna/ asthma or knowledge of premature birth.  Current Allergies, Complete Past Medical History, Past Surgical History, Family History, and Social History were reviewed in [employer] record.  ROS  The following are not active complaints unless bolded Hoarseness, sore throat, dysphagia, dental problems, itching, sneezing,  nasal congestion or discharge of excess mucus or purulent secretions, ear ache,   fever, chills, sweats, unintended wt loss or wt gain, classically pleuritic or exertional cp,  orthopnea pnd or arm/hand swelling  or leg swelling, presyncope, palpitations, abdominal pain, anorexia, nausea, vomiting, diarrhea  or change in bowel habits or change in bladder habits, change in stools or change in urine, dysuria, hematuria,  rash, arthralgias, visual complaints, headache, numbness, weakness or ataxia or problems with walking or coordination,  change in mood or  memory.        Current Meds  Medication  Sig   albuterol  (PROVENTIL ) (2.5 MG/3ML) 0.083% nebulizer solution Take 3 mLs (2.5 mg total) by nebulization every 2 (two) hours as needed for wheezing or shortness of breath.   albuterol  (VENTOLIN  HFA) 108 (90 Base) MCG/ACT inhaler INHALE 2 PUFFS INTO THE LUNGS EVERY 6 (SIX) HOURS AS NEEDED FOR WHEEZING OR SHORTNESS OF BREATH.   aspirin  EC 81 MG tablet Take 81 mg by mouth daily. Swallow whole.    atorvastatin  (LIPITOR ) 80 MG tablet Take 1 tablet (80 mg total) by mouth daily.   bisoprolol  (ZEBETA ) 5 MG tablet Take 1 tablet (5 mg total) by mouth daily.   budesonide  (PULMICORT ) 0.5 MG/2ML nebulizer solution Take 2 mLs (0.5 mg total) by nebulization 2 (two) times daily.   budesonide -glycopyrrolate -formoterol  (BREZTRI  AEROSPHERE) 160-9-4.8 MCG/ACT AERO inhaler Take 2 puffs first thing in morning and then another 2 puffs about 12 hours later.   buPROPion  (WELLBUTRIN  SR) 150 MG 12 hr tablet Take 1 tablet (150 mg total) by mouth daily for 3 days, THEN 1 tablet (150 mg total) 2 (two) times daily thereafter for smoking cessation.   butalbital -acetaminophen -caffeine  (FIORICET ) 50-325-40 MG tablet Take 1 tablet by mouth every 12 (twelve) hours as needed for headache.   DULoxetine  (CYMBALTA ) 60 MG capsule Take 1 capsule (60 mg total) by mouth daily. For chronic leg pains   ergocalciferol  (DRISDOL ) 1.25 MG (50000 UT) capsule Take 1 capsule (50,000 Units total) by mouth once a week.   ezetimibe  (ZETIA ) 10 MG tablet Take 1 tablet (10 mg total) by mouth daily.   ferrous sulfate  (FEROSUL) 325 (65 FE) MG tablet Take 1 tablet (325 mg total) by mouth 2 (two) times daily with a meal.   fluticasone  (FLONASE ) 50 MCG/ACT nasal spray Place 2 sprays into both nostrils daily.   folic acid  (FOLVITE ) 1 MG tablet Take 1 tablet (1 mg total) by mouth daily.   furosemide  (LASIX ) 40 MG tablet Take 1 tablet (40 mg total) by mouth 2 (two) times daily.   gabapentin  (NEURONTIN ) 300 MG capsule Take 1 capsule (300 mg total) by mouth at bedtime. For leg pains   ipratropium-albuterol  (DUONEB) 0.5-2.5 (3) MG/3ML SOLN Take 3 mLs by nebulization every 6 (six) hours.   isosorbide -hydrALAZINE  (BIDIL ) 20-37.5 MG tablet Take 1 tablet by mouth 3 (three) times daily.   nitroGLYCERIN  (NITROSTAT ) 0.4 MG SL tablet Place 0.4 mg under the tongue every 5 (five) minutes as needed for chest pain.   OXYGEN  Inhale 2 L/min into the lungs continuous.  (Patient taking differently: Inhale 4 L/min into the lungs continuous.)   pantoprazole  (PROTONIX ) 40 MG tablet Take 1 tablet (40 mg total) by mouth daily.   rivaroxaban  (XARELTO ) 20 MG TABS tablet Take 1 tablet (20 mg total) by mouth daily with supper.   sacubitril -valsartan  (ENTRESTO ) 24-26 MG Take 1 tablet by mouth 2 (two) times daily.                             Objective:   Physical Exam   Wts  03/09/2024      200  07/04/2023     205  07/04/2022     212  01/01/2022      223 06/27/2021      226  04/03/2021    224  10/04/2020      217 04/04/2020    212 06/30/2019      189  01/28/2019      191 12/31/2018      189  10/22/2016      190  07/24/2016        209  06/26/2016        213  05/28/2016      194   02/29/16 177 lb 12.8 oz (80.6 kg)  02/24/16 182 lb 12.8 oz (82.9 kg)  02/20/16 175 lb (79.4 kg)    Vital signs reviewed  03/09/2024  - Note at rest 02 sats  92% on 4lpm POC    General appearance:    amb (with rollator) elderly bm  somewhat frail and > stated age    HEENT :  Oropharynx  clear   Nasal turbinates mild non-specific turbinate edema   NECK :  without JVD/Nodes/TM/ nl carotid upstrokes bilaterally   LUNGS: no acc muscle use,  Mod barrel  contour chest wall with bilateral  Distant bs s audible wheeze and  without cough on insp or exp maneuvers and mod  Hyperresonant  to  percussion bilaterally     CV:  RRR  no s3 or murmur or increase in P2, and no edema   ABD:  soft and nontender with pos mid insp Hoover's  in the supine position. No bruits or organomegaly appreciated, bowel sounds nl  MS:   Ext warm without deformities or   obvious joint restrictions , calf tenderness, cyanosis or clubbing  SKIN: warm and dry without lesions    NEURO:  alert, approp, nl sensorium with  no motor or cerebellar deficits apparent.             Assessment & Plan:   Assessment & Plan COPD GOLD III/still smoking  Active smoker 02/29/2016  After extensive coaching HFA  effectiveness =    75% > continue symbicort   - Spirometry 05/28/2016  FEV1 1.48 (40%)  Ratio 46  p am symbicort  160 2bid  - 06/26/2016  After extensive coaching HFA effectiveness =    90% > bevespi  2 bid and change coreg  to bisoprolol  5 mg daily > not done as of 07/24/2016 > at ov 10/22/2016 off coreg  and all inhalers and much better sob but still some cough on entresto  maint - Spirometry 10/22/2016  FEV1 1.24 (35%)  Ratio 48 - 12/31/2018    tudorza rx one puff bid  - 01/28/2019  After extensive coaching inhaler device,  effectiveness =    90% with tudorza  03/09/2019 Reports improvement with Tudorza but not covered, refer to [facility]. May need PA processed- he has been on bevespi  and spiriva  in the past  - 06/30/2019  After extensive coaching inhaler device,  effectiveness =    90% with elipta try anoro one each am  PFT's  04/04/2020  FEV1 1.25 (35 % ) ratio 0.41  p 6 % improvement from saba p tudorza prior to study with DLCO  8.26 (27%) corrects to 1.38 (34%)  for alv volume and FV curve classic concave pattern  - 04/04/2020  rx  breztri  2bid   - 03/09/2024  After extensive coaching inhaler device,  effectiveness =    90% from a baseline of around 50%  so continue breztri  for now and consider adding Ohtubvaryre in 3 months if not better with less need for saba  -  Group E in terms of symptoms/risk so  laba/lama/ICS  therefore appropriate rx at this point   >>>  breztri    and approp SABA prn.    Chronic respiratory failure with hypoxia and hypercapnia (HCC) See copd - 10/22/2016  Walked RA x 3 laps @  185 ft each stopped due to  End of study moderate pace, no desat  - HCO03  11/11/18  = 41  - 12/31/2018   Walked 2lpm   2 laps @  approx 233ft each @ avg pace  stopped due to  Sob on 2lpm but no desats   - 01/28/2019   Walked RA x one lap =  approx 250 ft - stopped due to  Sob with sats ok on 3lpm POC > referred for POC  - HCO3  06/15/19 = 38  -  04/04/2020   Walked 3lpm  pulsed  3laps @ approx 2102ft each @avg  pace   stopped due desats p second lap to 87% corrected on 4lpm pulsed   With sats 98% at end  - HCO3  02/13/21  = 41  - 04/03/2021   Walked on RA  desat to 84% p 250 ft   Then 250 ft on 2lpm cont avg pace sats 92% at lowest - 06/27/2021 Patient Saturations on Room Air at Rest =95%  while Ambulating = 88% and  on 3 Liters of oxygen  while Ambulating = 95%  - HC03  7/210/23  = 26  - HC03  02/13/24      = 44   - as of 03/09/2024 using 02 4lpm 24/7   Advised keep 02 sats > 90% at all times by (titration of 02 except at nocturnally) on POC and using bipap hs (not sure about compliance though/ re-inforced)           Each maintenance medication was reviewed in detail including emphasizing most importantly the difference between maintenance and prns and under what circumstances the prns are to be triggered using an action plan format where appropriate.  Total time for H and P, chart review, counseling, reviewing hfa/ neb 02 /pulse ox  device(s) and generating customized AVS unique to this office visit / same day charting = 30 min post hosp f/u        Cigarette smoker 4  min discussion re active cigarette smoking in addition to office E&M  Ask about tobacco use:   ongoing Advise quitting   I took an extended  opportunity with this patient to outline the consequences of continued cigarette use  in airway disorders based on all the data we have from the multiple national lung health studies (perfomed over decades at millions of dollars in cost)  indicating that smoking cessation, not choice of inhalers or pulmonary physicians, is the most important aspect of his care and should see significant improvement in cough w/in 6 weeks Assess willingness:  Not committed at this point Assist in quit attempt:  Per PCP when ready Arrange follow up:   Follow up per Primary Care planned         AVS  Patient Instructions  My office will be contacting you by phone for referral for CT chest around thanksgiving 2025  at  [facility] radiology on [facility]    The key is to stop smoking completely before smoking completely stops you!  Make sure you check your oxygen  saturation  AT  your highest level of activity (not after you stop)   to be sure it stays over 90% and adjust  02 flow upward to maintain this level if needed but remember to turn it back to previous settings when you stop (to conserve your supply).   Please schedule a follow up visit in 3 months but call sooner if needed  with all respiratory medications /inhalers/  solutions in hand so we can verify exactly what you are taking. This includes all medications from all doctors and over the counters            Ozell America, MD 03/09/2024

## 2024-03-09 ENCOUNTER — Encounter: Payer: Self-pay | Admitting: Internal Medicine

## 2024-03-09 ENCOUNTER — Ambulatory Visit: Admitting: Internal Medicine

## 2024-03-09 VITALS — BP 114/62 | HR 72 | Temp 97.9°F | Ht 73.5 in | Wt 200.2 lb

## 2024-03-09 DIAGNOSIS — J9612 Chronic respiratory failure with hypercapnia: Secondary | ICD-10-CM

## 2024-03-09 DIAGNOSIS — J449 Chronic obstructive pulmonary disease, unspecified: Secondary | ICD-10-CM | POA: Diagnosis not present

## 2024-03-09 DIAGNOSIS — J9611 Chronic respiratory failure with hypoxia: Secondary | ICD-10-CM | POA: Diagnosis not present

## 2024-03-09 DIAGNOSIS — F17210 Nicotine dependence, cigarettes, uncomplicated: Secondary | ICD-10-CM

## 2024-03-09 NOTE — Assessment & Plan Note (Addendum)
 See copd - 10/22/2016  Walked RA x 3 laps @ 185 ft each stopped due to  End of study moderate pace, no desat  - HCO03  11/11/18  = 41  - 12/31/2018   Walked 2lpm   2 laps @  approx 251ft each @ avg pace  stopped due to  Sob on 2lpm but no desats   - 01/28/2019   Walked RA x one lap =  approx 250 ft - stopped due to  Sob with sats ok on 3lpm POC > referred for POC  - HCO3  06/15/19 = 38  -  04/04/2020   Walked 3lpm  pulsed  3laps @ approx 228ft each @avg  pace  stopped due desats p second lap to 87% corrected on 4lpm pulsed   With sats 98% at end  - HCO3  02/13/21  = 41  - 04/03/2021   Walked on RA  desat to 84% p 250 ft   Then 250 ft on 2lpm cont avg pace sats 92% at lowest - 06/27/2021 Patient Saturations on Room Air at Rest =95%  while Ambulating = 88% and  on 3 Liters of oxygen  while Ambulating = 95%  - HC03  7/210/23  = 26  - HC03  02/13/24      = 44   - as of 03/09/2024 using 02 4lpm 24/7   Advised keep 02 sats > 90% at all times by (titration of 02 except at nocturnally) on POC and using bipap hs (not sure about compliance though/ re-inforced)           Each maintenance medication was reviewed in detail including emphasizing most importantly the difference between maintenance and prns and under what circumstances the prns are to be triggered using an action plan format where appropriate.  Total time for H and P, chart review, counseling, reviewing hfa/ neb 02 /pulse ox  device(s) and generating customized AVS unique to this office visit / same day charting = 30 min post hosp f/u

## 2024-03-09 NOTE — Patient Instructions (Addendum)
 My office will be contacting you by phone for referral for CT chest around thanksgiving 2025  at [facility] radiology on [facility]    The key is to stop smoking completely before smoking completely stops you!  Make sure you check your oxygen  saturation  AT  your highest level of activity (not after you stop)   to be sure it stays over 90% and adjust  02 flow upward to maintain this level if needed but remember to turn it back to previous settings when you stop (to conserve your supply).   Please schedule a follow up visit in 3 months but call sooner if needed  with all respiratory medications /inhalers/ solutions in hand so we can verify exactly what you are taking. This includes all medications from all doctors and over the counters

## 2024-03-09 NOTE — Assessment & Plan Note (Addendum)
 Active smoker 02/29/2016  After extensive coaching HFA effectiveness =    75% > continue symbicort   - Spirometry 05/28/2016  FEV1 1.48 (40%)  Ratio 46  p am symbicort  160 2bid  - 06/26/2016  After extensive coaching HFA effectiveness =    90% > bevespi  2 bid and change coreg  to bisoprolol  5 mg daily > not done as of 07/24/2016 > at ov 10/22/2016 off coreg  and all inhalers and much better sob but still some cough on entresto  maint - Spirometry 10/22/2016  FEV1 1.24 (35%)  Ratio 48 - 12/31/2018    tudorza rx one puff bid  - 01/28/2019  After extensive coaching inhaler device,  effectiveness =    90% with tudorza  03/09/2019 Reports improvement with Tudorza but not covered, refer to [facility]. May need PA processed- he has been on bevespi  and spiriva  in the past  - 06/30/2019  After extensive coaching inhaler device,  effectiveness =    90% with elipta try anoro one each am  PFT's  04/04/2020  FEV1 1.25 (35 % ) ratio 0.41  p 6 % improvement from saba p tudorza prior to study with DLCO  8.26 (27%) corrects to 1.38 (34%)  for alv volume and FV curve classic concave pattern  - 04/04/2020  rx  breztri  2bid   - 03/09/2024  After extensive coaching inhaler device,  effectiveness =    90% from a baseline of around 50%  so continue breztri  for now and consider adding Ohtubvaryre in 3 months if not better with less need for saba  -  Group E in terms of symptoms/risk so  laba/lama/ICS  therefore appropriate rx at this point   >>>  breztri    and approp SABA prn.

## 2024-03-09 NOTE — Assessment & Plan Note (Addendum)
 4  min discussion re active cigarette smoking in addition to office E&M  Ask about tobacco use:   ongoing Advise quitting   I took an extended  opportunity with this patient to outline the consequences of continued cigarette use  in airway disorders based on all the data we have from the multiple national lung health studies (perfomed over decades at millions of dollars in cost)  indicating that smoking cessation, not choice of inhalers or pulmonary physicians, is the most important aspect of his care and should see significant improvement in cough w/in 6 weeks Assess willingness:  Not committed at this point Assist in quit attempt:  Per PCP when ready Arrange follow up:   Follow up per Primary Care planned

## 2024-03-11 ENCOUNTER — Other Ambulatory Visit: Payer: Self-pay

## 2024-03-11 ENCOUNTER — Emergency Department (HOSPITAL_COMMUNITY)

## 2024-03-11 ENCOUNTER — Emergency Department (HOSPITAL_COMMUNITY)
Admission: EM | Admit: 2024-03-11 | Discharge: 2024-03-11 | Disposition: A | Discharge status: Home / Self Care | Attending: Emergency Medicine | Admitting: Emergency Medicine

## 2024-03-11 ENCOUNTER — Encounter (HOSPITAL_COMMUNITY): Payer: Self-pay

## 2024-03-11 DIAGNOSIS — Z7901 Long term (current) use of anticoagulants: Secondary | ICD-10-CM | POA: Diagnosis not present

## 2024-03-11 DIAGNOSIS — I482 Chronic atrial fibrillation, unspecified: Secondary | ICD-10-CM | POA: Diagnosis not present

## 2024-03-11 DIAGNOSIS — Z8673 Personal history of transient ischemic attack (TIA), and cerebral infarction without residual deficits: Secondary | ICD-10-CM | POA: Insufficient documentation

## 2024-03-11 DIAGNOSIS — K1120 Sialoadenitis, unspecified: Secondary | ICD-10-CM | POA: Diagnosis not present

## 2024-03-11 DIAGNOSIS — Z7951 Long term (current) use of inhaled steroids: Secondary | ICD-10-CM | POA: Diagnosis not present

## 2024-03-11 DIAGNOSIS — I2510 Atherosclerotic heart disease of native coronary artery without angina pectoris: Secondary | ICD-10-CM | POA: Diagnosis not present

## 2024-03-11 DIAGNOSIS — J449 Chronic obstructive pulmonary disease, unspecified: Secondary | ICD-10-CM | POA: Insufficient documentation

## 2024-03-11 DIAGNOSIS — E119 Type 2 diabetes mellitus without complications: Secondary | ICD-10-CM | POA: Insufficient documentation

## 2024-03-11 DIAGNOSIS — Z7982 Long term (current) use of aspirin: Secondary | ICD-10-CM | POA: Diagnosis not present

## 2024-03-11 DIAGNOSIS — I10 Essential (primary) hypertension: Secondary | ICD-10-CM | POA: Diagnosis not present

## 2024-03-11 DIAGNOSIS — R220 Localized swelling, mass and lump, head: Secondary | ICD-10-CM | POA: Diagnosis present

## 2024-03-11 DIAGNOSIS — M542 Cervicalgia: Secondary | ICD-10-CM | POA: Diagnosis not present

## 2024-03-11 DIAGNOSIS — E041 Nontoxic single thyroid nodule: Secondary | ICD-10-CM | POA: Diagnosis not present

## 2024-03-11 DIAGNOSIS — I700 Atherosclerosis of aorta: Secondary | ICD-10-CM | POA: Diagnosis not present

## 2024-03-11 DIAGNOSIS — R609 Edema, unspecified: Secondary | ICD-10-CM | POA: Diagnosis not present

## 2024-03-11 LAB — CBC WITH DIFFERENTIAL/PLATELET
Abs Immature Granulocytes: 0.01 K/uL (ref 0.00–0.07)
Basophils Absolute: 0 K/uL (ref 0.0–0.1)
Basophils Relative: 0 %
Eosinophils Absolute: 0.1 K/uL (ref 0.0–0.5)
Eosinophils Relative: 2 %
HCT: 47.7 % (ref 39.0–52.0)
Hemoglobin: 13.2 g/dL (ref 13.0–17.0)
Immature Granulocytes: 0 %
Lymphocytes Relative: 21 %
Lymphs Abs: 1.1 K/uL (ref 0.7–4.0)
MCH: 26 pg (ref 26.0–34.0)
MCHC: 27.7 g/dL — ABNORMAL LOW (ref 30.0–36.0)
MCV: 94.1 fL (ref 80.0–100.0)
Monocytes Absolute: 0.5 K/uL (ref 0.1–1.0)
Monocytes Relative: 10 %
Neutro Abs: 3.6 K/uL (ref 1.7–7.7)
Neutrophils Relative %: 67 %
Platelets: 232 K/uL (ref 150–400)
RBC: 5.07 MIL/uL (ref 4.22–5.81)
RDW: 14.6 % (ref 11.5–15.5)
WBC: 5.4 K/uL (ref 4.0–10.5)
nRBC: 0 % (ref 0.0–0.2)

## 2024-03-11 LAB — BASIC METABOLIC PANEL WITH GFR
Anion gap: 10 (ref 5–15)
BUN: 8 mg/dL (ref 8–23)
CO2: 35 mmol/L — ABNORMAL HIGH (ref 22–32)
Calcium: 9.1 mg/dL (ref 8.9–10.3)
Chloride: 97 mmol/L — ABNORMAL LOW (ref 98–111)
Creatinine, Ser: 0.82 mg/dL (ref 0.61–1.24)
GFR, Estimated: >60 mL/min (ref >60)
Glucose, Bld: 108 mg/dL — ABNORMAL HIGH (ref 70–99)
Potassium: 3.5 mmol/L (ref 3.5–5.1)
Sodium: 142 mmol/L (ref 135–145)

## 2024-03-11 LAB — I-STAT CHEM 8, ED
BUN: 9 mg/dL (ref 8–23)
Calcium, Ion: 1.19 mmol/L (ref 1.15–1.40)
Chloride: 96 mmol/L — ABNORMAL LOW (ref 98–111)
Creatinine, Ser: 0.9 mg/dL (ref 0.61–1.24)
Glucose, Bld: 106 mg/dL — ABNORMAL HIGH (ref 70–99)
HCT: 47 % (ref 39.0–52.0)
Hemoglobin: 16 g/dL (ref 13.0–17.0)
Potassium: 3.7 mmol/L (ref 3.5–5.1)
Sodium: 144 mmol/L (ref 135–145)
TCO2: 36 mmol/L — ABNORMAL HIGH (ref 22–32)

## 2024-03-11 MED ORDER — AMOXICILLIN-POT CLAVULANATE 875-125 MG PO TABS
1.0000 | ORAL_TABLET | Freq: Two times a day (BID) | ORAL | 0 refills | Status: AC
  Filled 2024-03-11: qty 10, 5d supply, fill #0

## 2024-03-11 MED ORDER — IOHEXOL 350 MG/ML SOLN
75.0000 mL | Freq: Once | INTRAVENOUS | Status: AC | PRN
  Administered 2024-03-11: 75 mL via INTRAVENOUS

## 2024-03-11 NOTE — ED Provider Notes (Signed)
 [place] EMERGENCY DEPARTMENT AT [facility] Provider Note   CSN: [id number] Arrival date & time: 03/11/24  1127     Patient presents with: Swollen Jaw   Jared [name] is a 68 y.o. male.   HPI Patient presents for facial swelling.  Medical history includes DM, HTN, COPD, CAD, PAD, atrial fibrillation, CVA.  This morning, he noted some swelling to his left cheek.  He had associated pain and tenderness.  He denies any systemic symptoms.  Since his arrival in ED waiting room, he swelling has subsided.    Prior to Admission medications   Medication Sig Start Date End Date Taking? Authorizing Provider  amoxicillin -clavulanate (AUGMENTIN ) 875-125 MG tablet Take 1 tablet by mouth every 12 (twelve) hours for 5 days. 03/11/24 03/16/24 Yes Melvenia Motto, MD  albuterol  (PROVENTIL ) (2.5 MG/3ML) 0.083% nebulizer solution Take 3 mLs (2.5 mg total) by nebulization every 2 (two) hours as needed for wheezing or shortness of breath. 10/17/23   Darlean Ozell NOVAK, MD  albuterol  (VENTOLIN  HFA) 108 (90 Base) MCG/ACT inhaler INHALE 2 PUFFS INTO THE LUNGS EVERY 6 (SIX) HOURS AS NEEDED FOR WHEEZING OR SHORTNESS OF BREATH. 09/09/23   Newlin, Enobong, MD  aspirin  EC 81 MG tablet Take 81 mg by mouth daily. Swallow whole.    [provider]  atorvastatin  (LIPITOR ) 80 MG tablet Take 1 tablet (80 mg total) by mouth daily. 09/09/23   Newlin, Enobong, MD  bisoprolol  (ZEBETA ) 5 MG tablet Take 1 tablet (5 mg total) by mouth daily. 02/14/24   Waymond Cart, MD  budesonide  (PULMICORT ) 0.5 MG/2ML nebulizer solution Take 2 mLs (0.5 mg total) by nebulization 2 (two) times daily. 02/13/24   Waymond Cart, MD  budesonide -glycopyrrolate -formoterol  (BREZTRI  AEROSPHERE) 160-9-4.8 MCG/ACT AERO inhaler Take 2 puffs first thing in morning and then another 2 puffs about 12 hours later. 08/15/23   Darlean Ozell NOVAK, MD  buPROPion  (WELLBUTRIN  SR) 150 MG 12 hr tablet Take 1 tablet (150 mg total) by mouth daily for 3 days, THEN 1 tablet (150  mg total) 2 (two) times daily thereafter for smoking cessation. 02/24/24 03/26/24  Newlin, Enobong, MD  butalbital -acetaminophen -caffeine  (FIORICET ) 50-325-40 MG tablet Take 1 tablet by mouth every 12 (twelve) hours as needed for headache. 02/24/24   Delbert Clam, MD  DULoxetine  (CYMBALTA ) 60 MG capsule Take 1 capsule (60 mg total) by mouth daily. For chronic leg pains 02/24/24   Newlin, Enobong, MD  ergocalciferol  (DRISDOL ) 1.25 MG (50000 UT) capsule Take 1 capsule (50,000 Units total) by mouth once a week. 11/13/23   Newlin, Enobong, MD  ezetimibe  (ZETIA ) 10 MG tablet Take 1 tablet (10 mg total) by mouth daily. 06/06/23   Okey Vina GAILS, MD  ferrous sulfate  (FEROSUL) 325 (65 FE) MG tablet Take 1 tablet (325 mg total) by mouth 2 (two) times daily with a meal. 09/09/23   Delbert Clam, MD  fluticasone  (FLONASE ) 50 MCG/ACT nasal spray Place 2 sprays into both nostrils daily. 02/24/24   Newlin, Enobong, MD  folic acid  (FOLVITE ) 1 MG tablet Take 1 tablet (1 mg total) by mouth daily. 09/09/23   Newlin, Enobong, MD  furosemide  (LASIX ) 40 MG tablet Take 1 tablet (40 mg total) by mouth 2 (two) times daily. 02/24/24   Newlin, Enobong, MD  gabapentin  (NEURONTIN ) 300 MG capsule Take 1 capsule (300 mg total) by mouth at bedtime. For leg pains 11/20/23   Delbert Clam, MD  ipratropium-albuterol  (DUONEB) 0.5-2.5 (3) MG/3ML SOLN Take 3 mLs by nebulization every 6 (six) hours. 02/13/24  Waymond Cart, MD  isosorbide -hydrALAZINE  (BIDIL ) 20-37.5 MG tablet Take 1 tablet by mouth 3 (three) times daily. 02/14/24   Waymond Cart, MD  nitroGLYCERIN  (NITROSTAT ) 0.4 MG SL tablet Place 0.4 mg under the tongue every 5 (five) minutes as needed for chest pain.    [provider]  OXYGEN  Inhale 2 L/min into the lungs continuous. Patient taking differently: Inhale 4 L/min into the lungs continuous.    [provider]  pantoprazole  (PROTONIX ) 40 MG tablet Take 1 tablet (40 mg total) by mouth daily. 02/24/24   Newlin, Enobong,  MD  rivaroxaban  (XARELTO ) 20 MG TABS tablet Take 1 tablet (20 mg total) by mouth daily with supper. 02/24/24   Newlin, Enobong, MD  sacubitril -valsartan  (ENTRESTO ) 24-26 MG Take 1 tablet by mouth 2 (two) times daily. 02/13/24   Waymond Cart, MD  TUDORZA PRESSAIR  400 MCG/ACT AEPB INHALE 1 PUFF INTO THE LUNGS IN THE MORNING AND AT BEDTIME. 03/29/20 04/04/20  Darlean Ozell NOVAK, MD    Allergies: Lisinopril     Review of Systems  HENT:  Positive for facial swelling.   All other systems reviewed and are negative.   Updated Vital Signs BP 139/79 (BP Location: Right Arm)   Pulse 66   Temp 98.3 F (36.8 C) (Oral)   Resp 18   Ht 6' 2 (1.88 m)   Wt 92.5 kg   SpO2 99%   BMI 26.19 kg/m   Physical Exam Vitals and nursing note reviewed.  Constitutional:      General: He is not in acute distress.    Appearance: Normal appearance. He is well-developed. He is not ill-appearing, toxic-appearing or diaphoretic.  HENT:     Head: Normocephalic and atraumatic.     Right Ear: External ear normal.     Left Ear: External ear normal.     Nose: Nose normal.     Mouth/Throat:     Mouth: Mucous membranes are moist.  Eyes:     Extraocular Movements: Extraocular movements intact.     Conjunctiva/sclera: Conjunctivae normal.  Cardiovascular:     Rate and Rhythm: Normal rate and regular rhythm.  Pulmonary:     Effort: Pulmonary effort is normal. No respiratory distress.  Abdominal:     General: There is no distension.     Palpations: Abdomen is soft.     Tenderness: There is no abdominal tenderness.  Musculoskeletal:        General: No swelling. Normal range of motion.     Cervical back: Normal range of motion and neck supple.  Skin:    General: Skin is warm and dry.     Coloration: Skin is not jaundiced or pale.  Neurological:     General: No focal deficit present.     Mental Status: He is alert and oriented to person, place, and time.  Psychiatric:        Mood and Affect: Mood normal.         Behavior: Behavior normal.     (all labs ordered are listed, but only abnormal results are displayed) Labs Reviewed  CBC WITH DIFFERENTIAL/PLATELET - Abnormal; Notable for the following components:      Result Value   MCHC 27.7 (*)    All other components within normal limits  BASIC METABOLIC PANEL WITH GFR - Abnormal; Notable for the following components:   Chloride 97 (*)    CO2 35 (*)    Glucose, Bld 108 (*)    All other components within normal limits  I-STAT  CHEM 8, ED - Abnormal; Notable for the following components:   Chloride 96 (*)    Glucose, Bld 106 (*)    TCO2 36 (*)    All other components within normal limits    EKG: None  Radiology: CT Soft Tissue Neck W Contrast Result Date: 03/11/2024 CLINICAL DATA:  Provided history: Left parotitis-swelling, pain. EXAM: CT NECK WITH CONTRAST TECHNIQUE: Multidetector CT imaging of the neck was performed using the standard protocol following the bolus administration of intravenous contrast. RADIATION DOSE REDUCTION: This exam was performed according to the departmental dose-optimization program which includes automated exposure control, adjustment of the mA and/or kV according to patient size and/or use of iterative reconstruction technique. CONTRAST:  75mL OMNIPAQUE  IOHEXOL  350 MG/ML SOLN COMPARISON:  Maxillofacial CT 11/09/2018. FINDINGS: Pharynx and larynx: No appreciable swelling or mass within the oral cavity, pharynx or larynx. Salivary glands: Edematous left parotid gland with surrounding fat stranding. Findings are compatible with the provided history of left parotitis. Similar to the prior maxillofacial CT of 11/09/2018, there is asymmetric prominence of the left parotid duct with an abrupt caliber change anteriorly. No obstructing salivary stone is identified. Unremarkable appearance of the right parotid and bilateral submandibular glands. Thyroid : 18 mm left thyroid  lobe nodule (series 6, image 117). Lymph nodes: No  pathologically enlarged lymph nodes identified. Vascular: The major vascular structures of the neck are patent. Atherosclerotic plaque within the visualized aortic arch, proximal major branch vessels of the neck, vertebral arteries, about the carotid bifurcations, within the proximal internal carotid arteries and within the intracranial internal carotid arteries. Limited intracranial: No evidence of an acute intracranial abnormality within the field of view. Visualized orbits: No orbital mass or acute orbital finding. Mastoids and visualized paranasal sinuses: No significant paranasal sinus disease or mastoid effusion. Skeleton: Nonspecific reversal of the expected cervical lordosis. Cervical spondylosis. No acute fracture or aggressive osseous lesion. Upper chest: No consolidation within the imaged lung apices. Emphysema. IMPRESSION: 1. Edematous left parotid gland with surrounding fat stranding. Findings are compatible with the provided history of left parotitis. Similar to the prior maxillofacial CT of 11/09/2018, there is asymmetric prominence of the left parotid duct with an abrupt caliber change anteriorly. However, no obstructing salivary stone is identified. 2. 18 mm nodule within the left thyroid  lobe. A non-emergent thyroid  ultrasound is recommended for further evaluation. Reference: J Am Coll Radiol. 2015 Feb;12(2): 143-50. 3. Emphysema (ICD10-J43.9). 4. Aortic Atherosclerosis (ICD10-I70.0). Additional atherosclerotic vascular disease as described. Electronically Signed   By: Rockey Childs D.O.   On: 03/11/2024 15:21     Procedures   Medications Ordered in the ED  iohexol  (OMNIPAQUE ) 350 MG/ML injection 75 mL (75 mLs Intravenous Contrast Given 03/11/24 1419)                                    Medical Decision Making  Patient presenting for swelling overlying area of left parotid gland starting this morning.  His workup was initiated in the ED waiting room.  His imaging studies did not show  evidence of left-sided parotitis without evidence of obstructing stone.  During his ED wait, his swelling did subside.  On my assessment, patient has no appreciable swelling to the left side of his face.  He has some very mild soreness and tenderness.  Patient was provided with a prescription for antibiotics if he does have any further swelling or pain to the area.  He  is stable for discharge.     Final diagnoses:  Parotitis    ED Discharge Orders          Ordered    amoxicillin -clavulanate (AUGMENTIN ) 875-125 MG tablet  Every 12 hours        03/11/24 1654               Melvenia Motto, MD 03/11/24 1654

## 2024-03-11 NOTE — ED Triage Notes (Addendum)
 Pt states he noticed jaw swelling on left side and left sided neck pain that started this morning. Pt able to talk in complete sentences. Denies SHOB/CP. Hx of COPD. Denies throat swelling.

## 2024-03-11 NOTE — Discharge Instructions (Addendum)
 Your CT scan showed inflammation of your parotid gland.  Given that your swelling has improved, this will likely not need any treatment.  A prescription for antibiotics was sent to your pharmacy.  Take this only if you do have any further swelling or pain to the area.  Return to the emergency department for any new or worsening symptoms of concern.

## 2024-03-11 NOTE — ED Provider Triage Note (Signed)
 Emergency Medicine Provider Triage Evaluation Note  Malikye Reppond , a 68 y.o. male  was evaluated in triage.  Pt complains of left cheek swelling onset this morning upon waking, states pain moves into his neck. States bags under eyes yesterday but that's gone. Facial swelling has improved since onset although still present.   Review of Systems  Positive:  Negative: Dysphagia   Physical Exam  BP 104/67   Pulse 68   Temp 98.2 F (36.8 C)   Resp 18   Ht 6' 2 (1.88 m)   Wt 92.5 kg   SpO2 100%   BMI 26.19 kg/m  Gen:   Awake, no distress   Resp:  Normal effort  MSK:   Moves extremities without difficulty  Other:  Swelling over left parotid gland with tenderness  Medical Decision Making  Medically screening exam initiated at 1:06 PM.  Appropriate orders placed.  Kahil Agner was informed that the remainder of the evaluation will be completed by another provider, this initial triage assessment does not replace that evaluation, and the importance of remaining in the ED until their evaluation is complete.     Beverley Leita LABOR, PA-C 03/11/24 [phone]

## 2024-03-12 ENCOUNTER — Other Ambulatory Visit: Payer: Self-pay

## 2024-03-13 ENCOUNTER — Telehealth: Payer: Self-pay | Admitting: *Deleted

## 2024-03-13 ENCOUNTER — Ambulatory Visit: Admitting: Student

## 2024-03-13 ENCOUNTER — Other Ambulatory Visit: Payer: Self-pay | Admitting: *Deleted

## 2024-03-13 VITALS — BP 154/96

## 2024-03-13 DIAGNOSIS — I471 Supraventricular tachycardia, unspecified: Secondary | ICD-10-CM | POA: Diagnosis not present

## 2024-03-13 DIAGNOSIS — J449 Chronic obstructive pulmonary disease, unspecified: Secondary | ICD-10-CM

## 2024-03-13 DIAGNOSIS — I4719 Other supraventricular tachycardia: Secondary | ICD-10-CM | POA: Diagnosis not present

## 2024-03-13 NOTE — Telephone Encounter (Signed)
   Patient Name: Jared [name]  DOB: 30-Oct-1955 MRN: [id number]  Primary Cardiologist: Vina Gull, MD  Chart reviewed as part of pre-operative protocol coverage. Given past medical history and time since last visit, based on ACC/AHA guidelines, [name] is at acceptable risk for the planned procedure without further cardiovascular testing.   Preop clearance: Upcoming colonoscopy procedure. I reviewed his case with DOD Dr. [place], although he has daily episode of chest pain, he is not a good candidate for invasive study. Fortunately, upcoming colonoscopy is a very low risk procedure. Patient will be at least a moderate risk patient going for low risk procedure. He will need to hold the Xarelto  for 2 days prior to the procedure and restart once possible afterward at the GI physician's discretion.   The patient was advised that if he develops new symptoms prior to surgery to contact our office to arrange for a follow-up visit, and he verbalized understanding.  I will route this recommendation to the requesting party via Epic fax function and remove from pre-op pool.  Please call with questions.  [name], NP 03/13/2024, 4:00 PM

## 2024-03-13 NOTE — Transitions of Care (Post Inpatient/ED Visit) (Signed)
 Transition of Care week 5  Visit Note  03/13/2024  Name: Jared [name] MRN: [id number]          DOB: [birth year]  Situation: Patient enrolled in [facility] 30-day program. Visit completed with Jared [name] by telephone.   Background:   Initial Transition Care Management Follow-up Telephone Call    Past Medical History:  Diagnosis Date   Acute blood loss anemia 05/18/2023   Acute perforated appendicitis 04/30/2023   AKI (acute kidney injury) 11/18/2020   Atrial fibrillation with RVR (HCC) 05/06/2023   AVM (arteriovenous malformation)    CAD (coronary artery disease)    a. LHC 5/12:  LAD 20, pLCx 20, pRCA 40, dRCA 40, EF 35%, diff HK  //  b. Myoview 4/16: Overall Impression:  High risk stress nuclear study There is no evidence of ischemia.  There is severe LV dysfunction. LV Ejection Fraction: 30%.  LV Wall Motion:  There is global LV hypokinesis.     CAP (community acquired pneumonia) 09/2013   Chronic combined systolic and diastolic CHF (congestive heart failure) (HCC)    a. Echo 4/16:Mild LVH, EF 40-45%, diffuse HK //  b. Echo 8/17: EF 35-40%, diffuse HK, diastolic dysfunction, aortic sclerosis, trivial MR, moderate LAE, normal RVSF, moderate RAE, mild TR, PASP 42 mmHg // c. Echo 4/18: Mild concentric LVH, EF 30-35, normal wall motion, grade 1 diastolic dysfunction, PASP 49   Chronic respiratory failure (HCC)    Cluster headache    hx; haven't had one in awhile (01/09/2016)   COPD (chronic obstructive pulmonary disease) (HCC)    thelbert 01/09/2016   DM (diabetes mellitus) (HCC)    History of CVA (cerebrovascular accident)    Hypertension    IDA (iron  deficiency anemia)    Moderate tobacco use disorder    NICM (nonischemic cardiomyopathy) (HCC)    Nicotine  addiction    Peripheral vascular disease    Syncope 06/11/2019   Tobacco abuse    Type 2 diabetes mellitus (HCC) 05/14/2016    Assessment: Patient Reported Symptoms: Cognitive Cognitive Status: Able to follow simple commands, Alert  and oriented to person, place, and time, Normal speech and language skills      Neurological Neurological Review of Symptoms: No symptoms reported    HEENT HEENT Symptoms Reported: No symptoms reported, Other: (Recent ED visit for facial swelling, currently being treated with antibiotics for Parotitis. Patient denies swelling today) HEENT Management Strategies: Medication therapy HEENT Self-Management Outcome: 4 (good)    Cardiovascular Cardiovascular Symptoms Reported: No symptoms reported Does patient have uncontrolled Hypertension?: Yes Is patient checking Blood Pressure at home?: Yes Patient's Recent BP reading at home: 150/96-patient had not taken BP medication, repeat after taking medications was 154/94 Cardiovascular Self-Management Outcome: 3 (uncertain)  Respiratory Respiratory Symptoms Reported: Productive cough, Shortness of breath Other Respiratory Symptoms: no unusual symptoms, patient had follow up with Pulmonology on 03/09/24 Additional Respiratory Details: Patient continues to work on smoking cessation, down to 6 cig/day. RNCM provided encouragement Respiratory Self-Management Outcome: 4 (good)  Endocrine Endocrine Symptoms Reported: Not assessed    Gastrointestinal Gastrointestinal Symptoms Reported: No symptoms reported      Genitourinary Genitourinary Symptoms Reported: No symptoms reported    Integumentary Integumentary Symptoms Reported: Not assessed    Musculoskeletal Musculoskelatal Symptoms Reviewed: Not assessed        Psychosocial Psychosocial Symptoms Reported: Not assessed         Vitals:   03/13/24 1435  BP: (!) 154/96  SpO2: 92%    Medications Reviewed Today  Reviewed by Lucky Andrea LABOR, RN (Registered Nurse) on 03/13/24 at 1425  Med List Status: <None>   Medication Order Taking? Sig Documenting Provider Last Dose Status Informant  albuterol  (PROVENTIL ) (2.5 MG/3ML) 0.083% nebulizer solution [id number] Yes Take 3 mLs (2.5 mg total) by  nebulization every 2 (two) hours as needed for wheezing or shortness of breath. Darlean Ozell NOVAK, MD  Active Self, Pharmacy Records  albuterol  (VENTOLIN  HFA) 108 [phone] Base) MCG/ACT inhaler [id number] Yes INHALE 2 PUFFS INTO THE LUNGS EVERY 6 (SIX) HOURS AS NEEDED FOR WHEEZING OR SHORTNESS OF BREATH. Newlin, Enobong, MD  Active Self, Pharmacy Records  amoxicillin -clavulanate (AUGMENTIN ) 875-125 MG tablet [id number] Yes Take 1 tablet by mouth every 12 (twelve) hours for 5 days. Melvenia Motto, MD  Active   aspirin  EC 81 MG tablet [id number] Yes Take 81 mg by mouth daily. Swallow whole. [provider]  Active Self, Pharmacy Records  atorvastatin  (LIPITOR ) 80 MG tablet [id number] Yes Take 1 tablet (80 mg total) by mouth daily. Newlin, Enobong, MD  Active Self, Pharmacy Records           Med Note MURRAY, KATAWBBA   Wed Dec 11, 2023  1:23 PM)    bisoprolol  (ZEBETA ) 5 MG tablet [id number] Yes Take 1 tablet (5 mg total) by mouth daily. Waymond Cart, MD  Active   budesonide  (PULMICORT ) 0.5 MG/2ML nebulizer solution [id number] Yes Take 2 mLs (0.5 mg total) by nebulization 2 (two) times daily. Waymond Cart, MD  Active   budesonide -glycopyrrolate -formoterol  (BREZTRI  AEROSPHERE) 160-9-4.8 MCG/ACT AERO inhaler [id number] Yes Take 2 puffs first thing in morning and then another 2 puffs about 12 hours later. Darlean Ozell NOVAK, MD  Active Pharmacy Records, Self  buPROPion  (WELLBUTRIN  SR) 150 MG 12 hr tablet [id number] Yes Take 1 tablet (150 mg total) by mouth daily for 3 days, THEN 1 tablet (150 mg total) 2 (two) times daily thereafter for smoking cessation. Newlin, Enobong, MD  Active   butalbital -acetaminophen -caffeine  (FIORICET ) 50-325-40 MG tablet [id number] Yes Take 1 tablet by mouth every 12 (twelve) hours as needed for headache. Newlin, Enobong, MD  Active   DULoxetine  (CYMBALTA ) 60 MG capsule [id number] Yes Take 1 capsule (60 mg total) by mouth daily. For chronic leg pains Delbert Clam, MD  Active   ergocalciferol   (DRISDOL ) 1.25 MG (50000 UT) capsule [id number] Yes Take 1 capsule (50,000 Units total) by mouth once a week. Newlin, Enobong, MD  Active Self, Pharmacy Records  ezetimibe  (ZETIA ) 10 MG tablet [id number] Yes Take 1 tablet (10 mg total) by mouth daily. Okey Vina GAILS, MD  Active Self, Pharmacy Records  ferrous sulfate  [facility]) 325 (65 FE) MG tablet [id number] Yes Take 1 tablet (325 mg total) by mouth 2 (two) times daily with a meal. Delbert Clam, MD  Active Self, Pharmacy Records  fluticasone  (FLONASE ) 50 MCG/ACT nasal spray [id number] Yes Place 2 sprays into both nostrils daily. Newlin, Enobong, MD  Active   folic acid  (FOLVITE ) 1 MG tablet [id number] Yes Take 1 tablet (1 mg total) by mouth daily. Newlin, Enobong, MD  Active Self, Pharmacy Records  furosemide  (LASIX ) 40 MG tablet [id number] Yes Take 1 tablet (40 mg total) by mouth 2 (two) times daily. Newlin, Enobong, MD  Active   gabapentin  (NEURONTIN ) 300 MG capsule [id number] Yes Take 1 capsule (300 mg total) by mouth at bedtime. For leg pains Delbert Clam, MD  Active Self, Pharmacy Records  ipratropium-albuterol  (DUONEB) 0.5-2.5 (3) MG/3ML SOLN [id number] Yes Take 3 mLs by nebulization every 6 (six) hours. Tan,  Letha, MD  Active   isosorbide -hydrALAZINE  (BIDIL ) 20-37.5 MG tablet [id number] Yes Take 1 tablet by mouth 3 (three) times daily. Waymond Letha, MD  Active   nitroGLYCERIN  (NITROSTAT ) 0.4 MG SL tablet [id number] Yes Place 0.4 mg under the tongue every 5 (five) minutes as needed for chest pain. [provider]  Active Pharmacy Records, Self  OXYGEN  [id number] Yes Inhale 2 L/min into the lungs continuous.  Patient taking differently: Inhale 4 L/min into the lungs continuous.   [provider]  Active Self, Pharmacy Records  pantoprazole  (PROTONIX ) 40 MG tablet [id number] Yes Take 1 tablet (40 mg total) by mouth daily. Newlin, Enobong, MD  Active   rivaroxaban  (XARELTO ) 20 MG TABS tablet [id number] Yes Take 1 tablet (20 mg total) by  mouth daily with supper. Newlin, Enobong, MD  Active   sacubitril -valsartan  (ENTRESTO ) 24-26 MG [id number] Yes Take 1 tablet by mouth 2 (two) times daily. Waymond Letha, MD  Active     Discontinued 04/04/20 1029             Recommendation:   Continue Current Plan of Care  Follow Up Plan:   Referral to RN Case Manager Closing From:  Transitions of Care Program  Andrea Dimes RN, BSN [place]  [facility] RN Care Manager [phone]

## 2024-03-13 NOTE — Telephone Encounter (Signed)
 [facility] Pre-operative Risk Assessment     Request for surgical clearance:     Endoscopy Procedure  What type of surgery is being performed?     colonoscopy  When is this surgery scheduled?     03/23/24  What type of clearance is required ?   Pharmacy  Are there any medications that need to be held prior to surgery and how long? Xarelto  2 days  Practice name and name of physician performing surgery?      [facility]  What is your office phone and fax number?      Phone- [phone]  Fax- [phone]  Anesthesia type (None, local, MAC, general) ?       MAC   Please route your response to Powell Misty, CMA

## 2024-03-13 NOTE — Patient Instructions (Signed)
 Visit Information  Thank you for taking time to visit with me today. Please don't hesitate to contact me if I can be of assistance to you before our next scheduled telephone appointment.   Following is a copy of your care plan:   Goals Addressed             This Visit's Progress    VBCI Transitions of Care (TOC) Care Plan       Problems:  Recent Hospitalization for treatment of COPD Medication management barrier Patient seems overwhelmed with medication management  Goal:  Over the next 30 days, the patient will not experience hospital readmission  Interventions:  Transitions of Care: Doctor Visits  - discussed the importance of doctor visits Referral to Longitudinal Nurse Case Manager for Ongoing follow-up Post discharge activity limitations prescribed by provider reviewed Reviewed Signs and symptoms of infection Medication Review, verified patient has obtained new medications Reviewed upcoming appointments including:Oncology 03/18/24 and colonoscopy on 03/23/24   COPD Interventions: Advised patient to engage in light exercise as tolerated 3-5 days a week to aid in the the management of COPD Discussed the importance of adequate rest and management of fatigue with COPD Provided education about and advised patient to utilize infection prevention strategies to reduce risk of respiratory infection Use of home oxygen  Discussed the importance of smoking cessation-patient continues to take wellbutrin  and working to cut back on smoking-smoking about 6 cigs/day Discussed the importance of keeping oxygen  saturation 90% or greater  Patient Self Care Activities:  Attend all scheduled provider appointments Call pharmacy for medication refills 3-7 days in advance of running out of medications Call provider office for new concerns or questions  Notify RN Care Manager of TOC call rescheduling needs Participate in Transition of Care Program/Attend TOC scheduled calls Take medications as  prescribed   do breathing exercises every day eliminate symptom triggers at home keep follow-up appointments: Oncology on 03/18/24 and colonoscopy on 03/23/24 eat healthy/prescribed diet: low sodium, heart healthy do breathing exercises every day Use Bipap nightly  Plan:  The care management team will reach out to the patient again over the next 30 days.        Patient verbalizes understanding of instructions and care plan provided today and agrees to view in MyChart. Active MyChart status and patient understanding of how to access instructions and care plan via MyChart confirmed with patient.     The care management team will reach out to the patient again over the next 30 days.   Please call the care guide team at [phone] if you need to cancel or reschedule your appointment.   Please call [phone]-TALK (toll free, 24 hour hotline) go to [facility] [address], [place] [phone]) call 911 if you are experiencing a Mental Health or Behavioral Health Crisis or need someone to talk to.  Andrea Dimes RN, BSN [place]  [facility] RN Care Manager [phone]

## 2024-03-16 ENCOUNTER — Encounter (HOSPITAL_COMMUNITY): Payer: Self-pay | Admitting: Gastroenterology

## 2024-03-16 ENCOUNTER — Telehealth: Payer: Self-pay | Admitting: Gastroenterology

## 2024-03-16 DIAGNOSIS — I471 Supraventricular tachycardia, unspecified: Secondary | ICD-10-CM

## 2024-03-16 NOTE — Telephone Encounter (Signed)
Patient informed he may hold Xarelto.

## 2024-03-16 NOTE — Telephone Encounter (Addendum)
 Procedure:Colonoscopy Procedure date: 03/23/24 Procedure location: [facility] Arrival Time: 9:20 am Spoke with the patient Y/N:   No, I left a detailed message on [phone] on 03/16/24 @ 10:22 am for the patient to return call   Any prep concerns? ___  Has the patient obtained the prep from the pharmacy ? ___ Do you have a care partner and transportation: ___ Any additional concerns? ___

## 2024-03-17 NOTE — Assessment & Plan Note (Signed)
 secondary to h/o GI bleed, PUD, and inadequate iron  replacement -First found to have iron  deficiency 09/05/2018 with serum iron  9, 3% saturation, TIBC 358, and ferritin of 3 with a hemoglobin of 4.9, s/p blood transfusion  -EGD showed esophagitis, colonoscopy and small bowel endoscopy were unremarkable. Anemia improved -Developed acute on chronic anemia 10/2018, small bowel endoscopy  -Began receiving IV iron  at that time (Feraheme  510 mg x1 episodically), and been on iv venofer  lately, responded well  -Capsule endoscopy 06/18/19 showed gastritis, no AVMs or bleeding.  -continue iv iron  as needed, if ferrintin<200

## 2024-03-18 ENCOUNTER — Ambulatory Visit: Payer: Self-pay | Admitting: Internal Medicine

## 2024-03-18 ENCOUNTER — Inpatient Hospital Stay: Attending: Nurse Practitioner

## 2024-03-18 ENCOUNTER — Inpatient Hospital Stay (HOSPITAL_BASED_OUTPATIENT_CLINIC_OR_DEPARTMENT_OTHER): Admitting: Hematology

## 2024-03-18 VITALS — BP 132/62 | HR 91 | Temp 98.7°F | Resp 17 | Ht 74.0 in | Wt 198.7 lb

## 2024-03-18 DIAGNOSIS — D649 Anemia, unspecified: Secondary | ICD-10-CM

## 2024-03-18 DIAGNOSIS — K209 Esophagitis, unspecified without bleeding: Secondary | ICD-10-CM | POA: Insufficient documentation

## 2024-03-18 DIAGNOSIS — K274 Chronic or unspecified peptic ulcer, site unspecified, with hemorrhage: Secondary | ICD-10-CM | POA: Insufficient documentation

## 2024-03-18 DIAGNOSIS — D500 Iron deficiency anemia secondary to blood loss (chronic): Secondary | ICD-10-CM

## 2024-03-18 LAB — CBC WITH DIFFERENTIAL (CANCER CENTER ONLY)
Abs Immature Granulocytes: 0.01 K/uL (ref 0.00–0.07)
Basophils Absolute: 0 K/uL (ref 0.0–0.1)
Basophils Relative: 0 %
Eosinophils Absolute: 0.1 K/uL (ref 0.0–0.5)
Eosinophils Relative: 2 %
HCT: 43.6 % (ref 39.0–52.0)
Hemoglobin: 12.6 g/dL — ABNORMAL LOW (ref 13.0–17.0)
Immature Granulocytes: 0 %
Lymphocytes Relative: 23 %
Lymphs Abs: 1.1 K/uL (ref 0.7–4.0)
MCH: 26.4 pg (ref 26.0–34.0)
MCHC: 28.9 g/dL — ABNORMAL LOW (ref 30.0–36.0)
MCV: 91.4 fL (ref 80.0–100.0)
Monocytes Absolute: 0.5 K/uL (ref 0.1–1.0)
Monocytes Relative: 10 %
Neutro Abs: 3.1 K/uL (ref 1.7–7.7)
Neutrophils Relative %: 65 %
Platelet Count: 198 K/uL (ref 150–400)
RBC: 4.77 MIL/uL (ref 4.22–5.81)
RDW: 14.9 % (ref 11.5–15.5)
WBC Count: 4.8 K/uL (ref 4.0–10.5)
nRBC: 0 % (ref 0.0–0.2)

## 2024-03-18 LAB — IRON AND IRON BINDING CAPACITY (CC-WL,HP ONLY)
Iron: 73 ug/dL (ref 45–182)
Saturation Ratios: 31 % (ref 17.9–39.5)
TIBC: 238 ug/dL — ABNORMAL LOW (ref 250–450)
UIBC: 165 ug/dL (ref 117–376)

## 2024-03-18 LAB — SAMPLE TO BLOOD BANK

## 2024-03-18 LAB — TOTAL PROTEIN, URINE DIPSTICK: Protein, ur: NEGATIVE mg/dL

## 2024-03-18 LAB — FERRITIN: Ferritin: 109 ng/mL (ref 24–336)

## 2024-03-18 NOTE — Progress Notes (Signed)
 [facility]   [phone] [phone]   Clinic Follow up Note   Patient Care Team: Delbert Clam, MD as PCP - General (Family Medicine) Okey Vina GAILS, MD as PCP - Cardiology (Cardiology) [facility] Burton, Lacie K, NP as Nurse Practitioner (Hematology and Oncology) Joya Ade, DPM as Consulting Physician (Podiatry)  Date of Service:  03/18/2024  CHIEF COMPLAINT: f/u of anemia  CURRENT THERAPY:  IV iron  as needed if ferritin less than 200  Oncology History   Iron  deficiency anemia due to chronic blood loss secondary to h/o GI bleed, PUD, and inadequate iron  replacement -First found to have iron  deficiency 09/05/2018 with serum iron  9, 3% saturation, TIBC 358, and ferritin of 3 with a hemoglobin of 4.9, s/p blood transfusion  -EGD showed esophagitis, colonoscopy and small bowel endoscopy were unremarkable. Anemia improved -Developed acute on chronic anemia 10/2018, small bowel endoscopy  -Began receiving IV iron  at that time (Feraheme  510 mg x1 episodically), and been on iv venofer  lately, responded well  -Capsule endoscopy 06/18/19 showed gastritis, no AVMs or bleeding.  -continue iv iron  as needed, if ferrintin<200  Assessment & Plan Iron  deficiency anemia due to chronic blood loss Iron  deficiency anemia secondary to chronic blood loss with hemoglobin level improved to 12.6 g/dL from previous 89-88 g/dL. He reports transient symptom improvement post-IV iron  infusion, with no recent bleeding episodes. Last iron  level in August was 114, likely decreased now. He identifies low iron  levels by increased fatigue. - Administer IV iron  infusions as needed based on current iron  levels. - Monitor blood counts monthly to ensure stability. - Schedule follow-up appointment in four months. - Send lab results to [employer] for potential IV iron  infusion scheduling. - Communicate with him regarding iron  level results and  infusion needs.  Plan - He is clinically stable.  Lab reviewed, hemoglobin 12.6 today.  Iron  studies still pending - If ferritin less than 200, I will schedule IV iron  at the [facility] office - Lab monthly, follow-up in 4 months.    Discussed the use of AI scribe software for clinical note transcription with the patient, who gave verbal consent to proceed.  History of Present Illness Jared [name] is a 68 year old male with anemia who presents for follow-up of his blood counts.  He feels 'about the same' with no significant changes. He receives IV iron  infusions as needed, with the last dose on February 07, 2024. The infusions provide temporary relief. His hemoglobin level today is 12.6, previously ranging from 10 to 11. No recent bleeding. He completed medication in August to slow bleeding. He experiences increased fatigue when iron  levels are low. His last iron  level check in August was 114. He has had past blood transfusions, with the last at [facility], but none recently.     All other systems were reviewed with the patient and are negative.  MEDICAL HISTORY:  Past Medical History:  Diagnosis Date   Acute blood loss anemia 05/18/2023   Acute perforated appendicitis 04/30/2023   AKI (acute kidney injury) 11/18/2020   Atrial fibrillation with RVR (HCC) 05/06/2023   AVM (arteriovenous malformation)    CAD (coronary artery disease)    a. LHC 5/12:  LAD 20, pLCx 20, pRCA 40, dRCA 40, EF 35%, diff HK  //  b. Myoview 4/16: Overall Impression:  High risk stress nuclear study There is no evidence of ischemia.  There is severe LV dysfunction. LV Ejection Fraction: 30%.  LV  Wall Motion:  There is global LV hypokinesis.     CAP (community acquired pneumonia) 09/2013   Chronic combined systolic and diastolic CHF (congestive heart failure) (HCC)    a. Echo 4/16:Mild LVH, EF 40-45%, diffuse HK //  b. Echo 8/17: EF 35-40%, diffuse HK, diastolic dysfunction, aortic sclerosis, trivial MR,  moderate LAE, normal RVSF, moderate RAE, mild TR, PASP 42 mmHg // c. Echo 4/18: Mild concentric LVH, EF 30-35, normal wall motion, grade 1 diastolic dysfunction, PASP 49   Chronic respiratory failure (HCC)    Cluster headache    hx; haven't had one in awhile (01/09/2016)   COPD (chronic obstructive pulmonary disease) (HCC)    thelbert 01/09/2016   DM (diabetes mellitus) (HCC)    History of CVA (cerebrovascular accident)    Hypertension    IDA (iron  deficiency anemia)    Moderate tobacco use disorder    NICM (nonischemic cardiomyopathy) (HCC)    Nicotine  addiction    Peripheral vascular disease    Syncope 06/11/2019   Tobacco abuse    Type 2 diabetes mellitus (HCC) 05/14/2016    SURGICAL HISTORY: Past Surgical History:  Procedure Laterality Date   ABDOMINAL AORTOGRAM W/LOWER EXTREMITY N/A 12/07/2022   Procedure: ABDOMINAL AORTOGRAM W/LOWER EXTREMITY;  Surgeon: Magda Debby SAILOR, MD;  [facility]: [facility];  Service: Cardiovascular;  Laterality: N/A;   ABDOMINAL AORTOGRAM W/LOWER EXTREMITY Left 08/02/2023   Procedure: ABDOMINAL AORTOGRAM W/LOWER EXTREMITY;  Surgeon: Magda Debby SAILOR, MD;  Location: [facility];  Service: Cardiovascular;  Laterality: Left;   BALLOON ENTEROSCOPY N/A 11/19/2023   Procedure: ENTEROSCOPY, USING BALLOON;  Surgeon: San Sandor GAILS, DO;  Location: [facility] ENDOSCOPY;  Service: Gastroenterology;  Laterality: N/A;  single balloon enteroscopy   BIOPSY  11/12/2020   Procedure: BIOPSY;  Surgeon: Wilhelmenia Aloha Raddle., MD;  Location: [facility] ENDOSCOPY;  Service: Gastroenterology;;   CARDIAC CATHETERIZATION  10/2010   LM normal, LAD with 20% irregularities, LCX with 20%, RCA with 40% prox and 40% distal - EF of 35%   CATARACT EXTRACTION, BILATERAL     COLONOSCOPY W/ BIOPSIES AND POLYPECTOMY     COLONOSCOPY WITH PROPOFOL  N/A 09/06/2018   Procedure: COLONOSCOPY WITH PROPOFOL ;  Surgeon: Eda Iha, MD;  Location: [facility] ENDOSCOPY;  Service: Gastroenterology;   Laterality: N/A;   ENTEROSCOPY N/A 09/28/2018   Procedure: ENTEROSCOPY;  Surgeon: Rollin Dover, MD;  Location: [facility] ENDOSCOPY;  Service: Endoscopy;  Laterality: N/A;   ENTEROSCOPY N/A 10/28/2018   Procedure: ENTEROSCOPY;  Surgeon: Eda Iha, MD;  Location: [facility] ENDOSCOPY;  Service: Gastroenterology;  Laterality: N/A;   ENTEROSCOPY N/A 10/09/2020   Procedure: ENTEROSCOPY;  Surgeon: Abran Norleen SAILOR, MD;  Location: [facility] ENDOSCOPY;  Service: Endoscopy;  Laterality: N/A;   ENTEROSCOPY N/A 03/22/2022   Procedure: ENTEROSCOPY;  Surgeon: Leigh Elspeth SQUIBB, MD;  Location: [facility] ENDOSCOPY;  Service: Gastroenterology;  Laterality: N/A;   ENTEROSCOPY N/A 05/20/2023   Procedure: ENTEROSCOPY;  Surgeon: Legrand Victory LITTIE DOUGLAS, MD;  Location: [facility] ENDOSCOPY;  Service: Gastroenterology;  Laterality: N/A;   ENTEROSCOPY N/A 08/24/2023   Procedure: ENTEROSCOPY;  Surgeon: Suzann [name] HERO, MD;  Location: [facility] ENDOSCOPY;  Service: Gastroenterology;  Laterality: N/A;   ESOPHAGOGASTRODUODENOSCOPY N/A 11/12/2020   Procedure: ESOPHAGOGASTRODUODENOSCOPY (EGD);  Surgeon: Wilhelmenia Aloha Raddle., MD;  Location: [facility] ENDOSCOPY;  Service: Gastroenterology;  Laterality: N/A;   ESOPHAGOGASTRODUODENOSCOPY (EGD) WITH PROPOFOL  N/A 09/05/2018   Procedure: ESOPHAGOGASTRODUODENOSCOPY (EGD) WITH PROPOFOL ;  Surgeon: Leigh Elspeth SQUIBB, MD;  Location: [facility] ENDOSCOPY;  Service: Gastroenterology;  Laterality: N/A;  ESOPHAGOGASTRODUODENOSCOPY (EGD) WITH PROPOFOL  N/A 11/19/2020   Procedure: ESOPHAGOGASTRODUODENOSCOPY (EGD) WITH PROPOFOL ;  Surgeon: Albertus Gordy HERO, MD;  Location: [facility] ENDOSCOPY;  Service: Gastroenterology;  Laterality: N/A;   ESOPHAGOGASTRODUODENOSCOPY (EGD) WITH PROPOFOL  N/A 12/27/2022   Procedure: ESOPHAGOGASTRODUODENOSCOPY (EGD) WITH PROPOFOL ;  Surgeon: Shila Gustav GAILS, MD;  Location: [facility] ENDOSCOPY;  Service: Gastroenterology;  Laterality: N/A;   EXCISION MASS HEAD     GIVENS CAPSULE STUDY N/A 09/06/2018   Procedure: GIVENS CAPSULE STUDY;   Surgeon: Eda Iha, MD;  Location: [facility] ENDOSCOPY;  Service: Gastroenterology;  Laterality: N/A;   GIVENS CAPSULE STUDY N/A 09/26/2018   Procedure: GIVENS CAPSULE STUDY;  Surgeon: Albertus Gordy HERO, MD;  Location: [facility] ENDOSCOPY;  Service: Gastroenterology;  Laterality: N/A;   GIVENS CAPSULE STUDY N/A 06/14/2019   Procedure: GIVENS CAPSULE STUDY;  Surgeon: Shila Gustav GAILS, MD;  Location: [facility] ENDOSCOPY;  Service: Endoscopy;  Laterality: N/A;   HEMOSTASIS CLIP PLACEMENT  11/12/2020   Procedure: HEMOSTASIS CLIP PLACEMENT;  Surgeon: Wilhelmenia Aloha Raddle., MD;  Location: [facility] ENDOSCOPY;  Service: Gastroenterology;;   HEMOSTASIS CONTROL  11/12/2020   Procedure: HEMOSTASIS CONTROL;  Surgeon: Wilhelmenia Aloha Raddle., MD;  Location: [facility] ENDOSCOPY;  Service: Gastroenterology;;   HOT HEMOSTASIS N/A 10/28/2018   Procedure: HOT HEMOSTASIS (ARGON PLASMA COAGULATION/BICAP);  Surgeon: Eda Iha, MD;  Location: [facility] ENDOSCOPY;  Service: Gastroenterology;  Laterality: N/A;   HOT HEMOSTASIS N/A 11/12/2020   Procedure: HOT HEMOSTASIS (ARGON PLASMA COAGULATION/BICAP);  Surgeon: Wilhelmenia Aloha Raddle., MD;  Location: [facility]OS[facility];  Service: Gastroenterology;  Laterality: N/A;   HOT HEMOSTASIS N/A 03/22/2022   Procedure: HOT HEMOSTASIS (ARGON PLASMA COAGULATION/BICAP);  Surgeon: Leigh Elspeth SQUIBB, MD;  Location: [facility] ENDOSCOPY;  Service: Gastroenterology;  Laterality: N/A;   HOT HEMOSTASIS N/A 05/20/2023   Procedure: HOT HEMOSTASIS (ARGON PLASMA COAGULATION/BICAP);  Surgeon: Legrand Victory LITTIE DOUGLAS, MD;  Location: [facility] ENDOSCOPY;  Service: Gastroenterology;  Laterality: N/A;   HOT HEMOSTASIS N/A 08/24/2023   Procedure: EGD, WITH ARGON PLASMA COAGULATION;  Surgeon: Suzann [name] HERO, MD;  Location: [facility] ENDOSCOPY;  Service: Gastroenterology;  Laterality: N/A;   INCISION AND DRAINAGE PERIRECTAL ABSCESS N/A 06/05/2017   Procedure: IRRIGATION AND DEBRIDEMENT PERIRECTAL ABSCESS;  Surgeon: Teresa Lonni HERO, MD;  Location[facility] OR;   Service: General;  Laterality: N/A;   IR CATHETER TUBE CHANGE  05/14/2023   LAPAROSCOPIC APPENDECTOMY N/A 05/01/2023   Procedure: APPENDECTOMY LAPAROSCOPIC;  Surgeon: Ebbie Cough, MD;  Location: [facility] OR;  Service: General;  Laterality: N/A;   PERIPHERAL VASCULAR BALLOON ANGIOPLASTY Left 08/02/2023   Procedure: PERIPHERAL VASCULAR BALLOON ANGIOPLASTY;  Surgeon: Magda Debby SAILOR, MD;  [facility];  Service: Cardiovascular;  Laterality: Left;   PERIPHERAL VASCULAR INTERVENTION  12/07/2022   Procedure: PERIPHERAL VASCULAR INTERVENTION;  Surgeon: Magda Debby SAILOR, MD;  [facility];  Service: Cardiovascular;;   PERIPHERAL VASCULAR INTERVENTION Left 08/02/2023   Procedure: PERIPHERAL VASCULAR INTERVENTION;  Surgeon: Magda Debby SAILOR, MD;  [facility];  Service: Cardiovascular;  Laterality: Left;   SUBMUCOSAL TATTOO INJECTION  11/12/2020   Procedure: SUBMUCOSAL TATTOO INJECTION;  Surgeon: Wilhelmenia Aloha Raddle., MD;  Location: [facility]OSCOPY;  Service: Gastroenterology;;   SUBMUCOSAL TATTOO INJECTION  05/20/2023   Procedure: SUBMUCOSAL TATTOO INJECTION;  Surgeon: Legrand Victory LITTIE DOUGLAS, MD;  Location: [facility] ENDOSCOPY;  Service: Gastroenterology;;   VIDEO BRONCHOSCOPY Bilateral 05/08/2016   Procedure: VIDEO BRONCHOSCOPY WITH FLUORO;  Surgeon: Harden GAILS Staff, MD;  Location: [facility] ENDOSCOPY;  Service: Cardiopulmonary;  Laterality: Bilateral;    I  have reviewed the social history and family history with the patient and they are unchanged from previous note.  ALLERGIES:  is allergic to lisinopril .  MEDICATIONS:  Current Outpatient Medications  Medication Sig Dispense Refill   albuterol  (PROVENTIL ) (2.5 MG/3ML) 0.083% nebulizer solution Take 3 mLs (2.5 mg total) by nebulization every 2 (two) hours as needed for wheezing or shortness of breath. 75 mL 1   albuterol  (VENTOLIN  HFA) 108 (90 Base) MCG/ACT inhaler INHALE 2 PUFFS INTO THE LUNGS EVERY 6 (SIX) HOURS AS NEEDED  FOR WHEEZING OR SHORTNESS OF BREATH. 18 g 6   aspirin  EC 81 MG tablet Take 81 mg by mouth daily. Swallow whole.     atorvastatin  (LIPITOR ) 80 MG tablet Take 1 tablet (80 mg total) by mouth daily. 90 tablet 1   bisoprolol  (ZEBETA ) 5 MG tablet Take 1 tablet (5 mg total) by mouth daily. 30 tablet 0   budesonide  (PULMICORT ) 0.5 MG/2ML nebulizer solution Take 2 mLs (0.5 mg total) by nebulization 2 (two) times daily. 120 mL 0   budesonide -glycopyrrolate -formoterol  (BREZTRI  AEROSPHERE) 160-9-4.8 MCG/ACT AERO inhaler Take 2 puffs first thing in morning and then another 2 puffs about 12 hours later. 10.7 g 11   buPROPion  (WELLBUTRIN  SR) 150 MG 12 hr tablet Take 1 tablet (150 mg total) by mouth daily for 3 days, THEN 1 tablet (150 mg total) 2 (two) times daily thereafter for smoking cessation. 60 tablet 3   butalbital -acetaminophen -caffeine  (FIORICET ) 50-325-40 MG tablet Take 1 tablet by mouth every 12 (twelve) hours as needed for headache. 30 tablet 1   DULoxetine  (CYMBALTA ) 60 MG capsule Take 1 capsule (60 mg total) by mouth daily. For chronic leg pains 90 capsule 1   ergocalciferol  (DRISDOL ) 1.25 MG (50000 UT) capsule Take 1 capsule (50,000 Units total) by mouth once a week. 12 capsule 1   ezetimibe  (ZETIA ) 10 MG tablet Take 1 tablet (10 mg total) by mouth daily. 90 tablet 2   ferrous sulfate  (FEROSUL) 325 (65 FE) MG tablet Take 1 tablet (325 mg total) by mouth 2 (two) times daily with a meal. 180 tablet 1   fluticasone  (FLONASE ) 50 MCG/ACT nasal spray Place 2 sprays into both nostrils daily. 16 g 6   folic acid  (FOLVITE ) 1 MG tablet Take 1 tablet (1 mg total) by mouth daily. 90 tablet 1   furosemide  (LASIX ) 40 MG tablet Take 1 tablet (40 mg total) by mouth 2 (two) times daily. 180 tablet 1   gabapentin  (NEURONTIN ) 300 MG capsule Take 1 capsule (300 mg total) by mouth at bedtime. For leg pains 90 capsule 1   ipratropium-albuterol  (DUONEB) 0.5-2.5 (3) MG/3ML SOLN Take 3 mLs by nebulization every 6 (six)  hours. 360 mL 0   isosorbide -hydrALAZINE  (BIDIL ) 20-37.5 MG tablet Take 1 tablet by mouth 3 (three) times daily. 90 tablet 0   nitroGLYCERIN  (NITROSTAT ) 0.4 MG SL tablet Place 0.4 mg under the tongue every 5 (five) minutes as needed for chest pain.     OXYGEN  Inhale 2 L/min into the lungs continuous. (Patient taking differently: Inhale 4 L/min into the lungs continuous.)     pantoprazole  (PROTONIX ) 40 MG tablet Take 1 tablet (40 mg total) by mouth daily. 90 tablet 1   rivaroxaban  (XARELTO ) 20 MG TABS tablet Take 1 tablet (20 mg total) by mouth daily with supper. 90 tablet 1   sacubitril -valsartan  (ENTRESTO ) 24-26 MG Take 1 tablet by mouth 2 (two) times daily. 60 tablet 0   No current facility-administered medications for this visit.  PHYSICAL EXAMINATION: ECOG PERFORMANCE STATUS: 2 - Symptomatic, <50% confined to bed  Vitals:   03/18/24 0912  BP: 132/62  Pulse: 91  Resp: 17  Temp: 98.7 F (37.1 C)  SpO2: 98%   Wt Readings from Last 3 Encounters:  03/18/24 198 lb 11.2 oz (90.1 kg)  03/11/24 204 lb (92.5 kg)  03/09/24 200 lb 3.2 oz (90.8 kg)     GENERAL:alert, no distress and comfortable SKIN: skin color, texture, turgor are normal, no rashes or significant lesions EYES: normal, Conjunctiva are pink and non-injected, sclera clear Musculoskeletal:no cyanosis of digits and no clubbing  NEURO: alert & oriented x 3 with fluent speech, no focal motor/sensory deficits  Physical Exam    LABORATORY DATA:  I have reviewed the data as listed    Latest Ref Rng & Units 03/18/2024    9:00 AM 03/11/2024    1:19 PM 03/11/2024    1:06 PM  CBC  WBC 4.0 - 10.5 K/uL 4.8   5.4   Hemoglobin 13.0 - 17.0 g/dL 87.3  83.9  86.7   Hematocrit 39.0 - 52.0 % 43.6  47.0  47.7   Platelets 150 - 400 K/uL 198   232         Latest Ref Rng & Units 03/11/2024    1:19 PM 03/11/2024    1:06 PM 02/13/2024    2:18 AM  CMP  Glucose 70 - 99 mg/dL 893  891  866   BUN 8 - 23 mg/dL 9  8  15    Creatinine  0.61 - 1.24 mg/dL 9.09  9.17  9.22   Sodium 135 - 145 mmol/L 144  142  145   Potassium 3.5 - 5.1 mmol/L 3.7  3.5  4.3   Chloride 98 - 111 mmol/L 96  97  96   CO2 22 - 32 mmol/L  35  44   Calcium  8.9 - 10.3 mg/dL  9.1  8.7       RADIOGRAPHIC STUDIES: I have personally reviewed the radiological images as listed and agreed with the findings in the report. LONG TERM MONITOR (3-14 DAYS) Result Date: 03/16/2024 Patch Wear Time:  13 days and 17 hours (2025-09-12T14:48:25-0400 to 2025-09-26T08:27:11-398) Impression:   Sinus rhythm   Rates 50 to 97 bpm  Average HR 71 bpm Frequent PACs (11.1% total) Rare PVCs 5 runs VT, fastest 8 beats at max rate 190 bpm, longest 9 beats with average rate 158 bpm 849 bursts SVT, fastest for 10 beats at maximum rate 174 bpm, longest 1 min 24 seconds at 137 bpm  Triggered events, diary entries correlated with sinus rhythm, sinus rhythm with PAC, sinus rhythm with PVC     No orders of the defined types were placed in this encounter.  All questions were answered. The patient knows to call the clinic with any problems, questions or concerns. No barriers to learning was detected. The total time spent in the appointment was 15 minutes, including review of chart and various tests results, discussions about plan of care and coordination of care plan     Onita Mattock, MD 03/18/2024

## 2024-03-19 ENCOUNTER — Other Ambulatory Visit: Payer: Self-pay | Admitting: *Deleted

## 2024-03-19 NOTE — Patient Instructions (Signed)
 Visit Information  Thank you for taking time to visit with me today. Please don't hesitate to contact me if I can be of assistance to you before our next scheduled appointment.  Our next appointment is by telephone on 04/01/2024 at 1:30 pm Please call the care guide team at [phone] if you need to cancel or reschedule your appointment.   Following is a copy of your care plan:   Goals Addressed             This Visit's Progress    VBCI RN Care Plan-COPD       Problems:  Knowledge Deficits related to COPD  Goal: Over the next 90 days the Patient will verbalize basic understanding of COPD disease process and self health management plan as evidenced by Chart review and patient report   Interventions:   COPD Interventions: Advised patient to engage in light exercise as tolerated 3-5 days a week to aid in the the management of COPD Advised patient to track and manage COPD triggers Advised patient to self assesses COPD action plan zone and make appointment with provider if in the yellow zone for 48 hours without improvement Assessed social determinant of health barriers Discussed the importance of adequate rest and management of fatigue with COPD Provided education about and advised patient to utilize infection prevention strategies to reduce risk of respiratory infection Provided instruction about proper use of medications used for management of COPD including inhalers Provided patient with basic written and verbal COPD education on self care/management/and exacerbation prevention Screening for signs and symptoms of depression related to chronic disease state  Use of home oxygen   Patient Self-Care Activities:  Attend all scheduled provider appointments Attend church or other social activities Call pharmacy for medication refills 3-7 days in advance of running out of medications Call provider office for new concerns or questions  Perform all self care activities independently   Perform IADL's (shopping, preparing meals, housekeeping, managing finances) independently Take medications as prescribed   Work with the social worker to address care coordination needs and will continue to work with the clinical team to address health care and disease management related needs eliminate smoking in my home identify and avoid work-related triggers identify and remove indoor air pollutants limit outdoor activity during cold weather do breathing exercises every day begin a symptom diary develop a rescue plan eliminate symptom triggers at home follow rescue plan if symptoms flare-up keep follow-up appointments: 11/11 Echocardiogram, 12/22 Dr Darlean use an extra pillow to sleep eat healthy/prescribed diet: Continue low sodium get at least 7 to 8 hours of sleep at night  Plan:  Telephone follow up appointment with care management team member scheduled for:  04/01/2024 @ 1:30 pm The patient has been provided with contact information for the care management team and has been advised to call with any health related questions or concerns.              Please call the Suicide and Crisis Lifeline: 988 call the [place]  National Suicide Prevention Lifeline: 567-321-0316 or TTY: (424)065-5526 TTY 201-887-9080) to talk to a trained counselor call 1-800-273-TALK (toll free, 24 hour hotline) if you are experiencing a Mental Health or Behavioral Health Crisis or need someone to talk to.  Patient verbalizes understanding of instructions and care plan provided today and agrees to view in MyChart. Active MyChart status and patient understanding of how to access instructions and care plan via MyChart confirmed with patient.      Olam Ku, RN,  BSN [facility], [facility] RN Care Manager Direct Dial: [phone]  Fax: [phone]

## 2024-03-19 NOTE — Patient Outreach (Signed)
 Complex Care Management   Visit Note  03/19/2024  Name:  Jared [name] MRN: [id number] DOB: September 04, 1955  Situation: Referral received for Complex Care Management related to COPD I obtained verbal consent from Patient.  Visit completed with Patient  on the phone  Background:   Past Medical History:  Diagnosis Date   Acute blood loss anemia 05/18/2023   Acute perforated appendicitis 04/30/2023   AKI (acute kidney injury) 11/18/2020   Atrial fibrillation with RVR (HCC) 05/06/2023   AVM (arteriovenous malformation)    CAD (coronary artery disease)    a. LHC 5/12:  LAD 20, pLCx 20, pRCA 40, dRCA 40, EF 35%, diff HK  //  b. Myoview 4/16: Overall Impression:  High risk stress nuclear study There is no evidence of ischemia.  There is severe LV dysfunction. LV Ejection Fraction: 30%.  LV Wall Motion:  There is global LV hypokinesis.     CAP (community acquired pneumonia) 09/2013   Chronic combined systolic and diastolic CHF (congestive heart failure) (HCC)    a. Echo 4/16:Mild LVH, EF 40-45%, diffuse HK //  b. Echo 8/17: EF 35-40%, diffuse HK, diastolic dysfunction, aortic sclerosis, trivial MR, moderate LAE, normal RVSF, moderate RAE, mild TR, PASP 42 mmHg // c. Echo 4/18: Mild concentric LVH, EF 30-35, normal wall motion, grade 1 diastolic dysfunction, PASP 49   Chronic respiratory failure (HCC)    Cluster headache    hx; haven't had one in awhile (01/09/2016)   COPD (chronic obstructive pulmonary disease) (HCC)    thelbert 01/09/2016   DM (diabetes mellitus) (HCC)    History of CVA (cerebrovascular accident)    Hypertension    IDA (iron  deficiency anemia)    Moderate tobacco use disorder    NICM (nonischemic cardiomyopathy) (HCC)    Nicotine  addiction    Peripheral vascular disease    Syncope 06/11/2019   Tobacco abuse    Type 2 diabetes mellitus (HCC) 05/14/2016    Assessment: Patient Reported Symptoms:  Cognitive Cognitive Status: Able to follow simple commands, Alert and oriented to  person, place, and time, Normal speech and language skills   Health Maintenance Behaviors: Annual physical exam Healing Pattern: Fast  Neurological Neurological Review of Symptoms: Headaches (Pt continue to have a hx of headaches (MD aware) with ongoing medication Floricet, states this medication helps.) Neurological Management Strategies: Medication therapy, Routine screening Neurological Self-Management Outcome: 4 (good)  HEENT HEENT Symptoms Reported: No symptoms reported HEENT Management Strategies: Routine screening HEENT Self-Management Outcome: 4 (good) HEENT Comment: Continue to have nasal dryness with occassional nose-bleeds due to use of oxygen -uses nasal spray to help mositen the passages.    Cardiovascular Cardiovascular Symptoms Reported: No symptoms reported Does patient have uncontrolled Hypertension?: Yes Is patient checking Blood Pressure at home?: Yes Patient's Recent BP reading at home: Patient reports his range for his BP 140-150/90  with read today at 156/79 and remains asymptomatic. Cardiovascular Management Strategies: Medical device, Medication therapy, Routine screening, Adequate rest Weight: 198 lb (89.8 kg) Cardiovascular Self-Management Outcome: 4 (good) Cardiovascular Comment: Due to irregular heart beats-pt recently submitted the heart monitor worn for 14 days-currently after his discharged from the hospital currently awaiting interventions. Pt will follow up with his cardiologist (Dr. Okey on 05/16/2024 and a Echocargiogram on 04/21/2024). Pt educated if symptoms worsen to contact his provider.  Respiratory Respiratory Symptoms Reported: Shortness of breath, Dry cough (Ongoing history due to his COPD and reports he continue to smoke) Other Respiratory Symptoms: Next follow up with pulmonogist Dr. Darlean on  06/01/2024. Additional Respiratory Details: Patient reports he is down 5 cig/day with the use of 10 cigarettes with ongoing use of the Wellbutrin  medication and  aware of the prescribed regimen. Respiratory Management Strategies: BiPAP, Oxygen  therapy, Breathing techniques, Coping strategies, Medication therapy, Adequate rest Respiratory Self-Management Outcome: 4 (good)  Endocrine Endocrine Symptoms Reported: No symptoms reported Is patient diabetic?: Yes (Recent A1C 6.0 -02/24/2024) Is patient checking blood sugars at home?: No Endocrine Self-Management Outcome: 4 (good)  Gastrointestinal Gastrointestinal Symptoms Reported: No symptoms reported      Genitourinary Genitourinary Symptoms Reported: No symptoms reported Genitourinary Management Strategies: Medication therapy  Integumentary Integumentary Symptoms Reported: Bruising Additional Integumentary Details: Aware and educated on Xarelto  concerning bleed precautions and safety Skin Management Strategies: Routine screening, Medication therapy, Coping strategies, Adequate rest Skin Comment: Aware to contact Dr. Lanny with related issues and provided provider's contact number [phone]  Musculoskeletal Musculoskelatal Symptoms Reviewed: No symptoms reported Musculoskeletal Management Strategies: Routine screening, Medical device Musculoskeletal Comment: uses assistive devices Falls in the past year?: No Number of falls in past year: 1 or less Was there an injury with Fall?: No Fall Risk Category Calculator: 0 Patient Fall Risk Level: Low Fall Risk Fall risk Follow up: Falls prevention discussed, Education provided  Psychosocial Psychosocial Symptoms Reported: No symptoms reported Behavioral Management Strategies: Medication therapy Behavioral Health Self-Management Outcome: 4 (good)   Quality of Family Relationships: helpful, supportive Do you feel physically threatened by others?: No    03/19/2024    PHQ2-9 Depression Screening   Little interest or pleasure in doing things Not at all  Feeling down, depressed, or hopeless Not at all  PHQ-2 - Total Score 0  Trouble falling or staying  asleep, or sleeping too much    Feeling tired or having little energy    Poor appetite or overeating     Feeling bad about yourself - or that you are a failure or have let yourself or your family down    Trouble concentrating on things, such as reading the newspaper or watching television    Moving or speaking so slowly that other people could have noticed.  Or the opposite - being so fidgety or restless that you have been moving around a lot more than usual    Thoughts that you would be better off dead, or hurting yourself in some way    PHQ2-9 Total Score    If you checked off any problems, how difficult have these problems made it for you to do your work, take care of things at home, or get along with other people    Depression Interventions/Treatment      There were no vitals filed for this visit.  Medications Reviewed Today     Reviewed by Alvia Olam BIRCH, RN (Registered Nurse) on 03/19/24 at 1358  Med List Status: <None>   Medication Order Taking? Sig Documenting Provider Last Dose Status Informant  albuterol  (PROVENTIL ) (2.5 MG/3ML) 0.083% nebulizer solution [id number] Yes Take 3 mLs (2.5 mg total) by nebulization every 2 (two) hours as needed for wheezing or shortness of breath. Darlean Ozell NOVAK, MD  Active Self, Pharmacy Records  albuterol  (VENTOLIN  HFA) [phone] Base) MCG/ACT inhaler [id number] Yes INHALE 2 PUFFS INTO THE LUNGS EVERY 6 (SIX) HOURS AS NEEDED FOR WHEEZING OR SHORTNESS OF BREATH. Newlin, Enobong, MD  Active Self, Pharmacy Records  aspirin  EC 81 MG tablet [id number] Yes Take 81 mg by mouth daily. Swallow whole. [provider]  Active Self, Pharmacy Records  atorvastatin  (LIPITOR )  80 MG tablet [id number] Yes Take 1 tablet (80 mg total) by mouth daily. Newlin, Enobong, MD  Active Self, Pharmacy Records           Med Note MURRAY, KATAWBBA   Wed Dec 11, 2023  1:23 PM)    bisoprolol  (ZEBETA ) 5 MG tablet [id number] Yes Take 1 tablet (5 mg total) by mouth daily. Waymond Cart, MD  Active   budesonide  (PULMICORT ) 0.5 MG/2ML nebulizer solution [id number] Yes Take 2 mLs (0.5 mg total) by nebulization 2 (two) times daily. Waymond Cart, MD  Active   budesonide -glycopyrrolate -formoterol  (BREZTRI  AEROSPHERE) 160-9-4.8 MCG/ACT AERO inhaler [id number] Yes Take 2 puffs first thing in morning and then another 2 puffs about 12 hours later. Darlean Ozell NOVAK, MD  Active Pharmacy Records, Self  buPROPion  (WELLBUTRIN  SR) 150 MG 12 hr tablet [id number] Yes Take 1 tablet (150 mg total) by mouth daily for 3 days, THEN 1 tablet (150 mg total) 2 (two) times daily thereafter for smoking cessation. Newlin, Enobong, MD  Active   butalbital -acetaminophen -caffeine  (FIORICET ) 50-325-40 MG tablet [id number] Yes Take 1 tablet by mouth every 12 (twelve) hours as needed for headache. Newlin, Enobong, MD  Active   DULoxetine  (CYMBALTA ) 60 MG capsule [id number] Yes Take 1 capsule (60 mg total) by mouth daily. For chronic leg pains Delbert Clam, MD  Active   ergocalciferol  (DRISDOL ) 1.25 MG (50000 UT) capsule [id number] Yes Take 1 capsule (50,000 Units total) by mouth once a week. Newlin, Enobong, MD  Active Self, Pharmacy Records  ezetimibe  (ZETIA ) 10 MG tablet [id number] Yes Take 1 tablet (10 mg total) by mouth daily. Okey Vina GAILS, MD  Active Self, Pharmacy Records  ferrous sulfate  [facility]) 325 (65 FE) MG tablet [id number] Yes Take 1 tablet (325 mg total) by mouth 2 (two) times daily with a meal. Delbert Clam, MD  Active Self, Pharmacy Records  fluticasone  (FLONASE ) 50 MCG/ACT nasal spray [id number] Yes Place 2 sprays into both nostrils daily. Newlin, Enobong, MD  Active   folic acid  (FOLVITE ) 1 MG tablet [id number] Yes Take 1 tablet (1 mg total) by mouth daily. Newlin, Enobong, MD  Active Self, Pharmacy Records  furosemide  (LASIX ) 40 MG tablet [id number] Yes Take 1 tablet (40 mg total) by mouth 2 (two) times daily. Newlin, Enobong, MD  Active   gabapentin  (NEURONTIN ) 300 MG capsule [id number] Yes Take 1  capsule (300 mg total) by mouth at bedtime. For leg pains Delbert Clam, MD  Active Self, Pharmacy Records  ipratropium-albuterol  (DUONEB) 0.5-2.5 (3) MG/3ML SOLN [id number] Yes Take 3 mLs by nebulization every 6 (six) hours. Waymond Cart, MD  Active   isosorbide -hydrALAZINE  (BIDIL ) 20-37.5 MG tablet [id number] Yes Take 1 tablet by mouth 3 (three) times daily. Waymond Cart, MD  Active   nitroGLYCERIN  (NITROSTAT ) 0.4 MG SL tablet [id number] Yes Place 0.4 mg under the tongue every 5 (five) minutes as needed for chest pain. [provider]  Active Pharmacy Records, Self  OXYGEN  [id number] Yes Inhale 2 L/min into the lungs continuous.  Patient taking differently: Inhale 4 L/min into the lungs continuous.   [provider]  Active Self, Pharmacy Records  pantoprazole  (PROTONIX ) 40 MG tablet [id number] Yes Take 1 tablet (40 mg total) by mouth daily. Newlin, Enobong, MD  Active   rivaroxaban  (XARELTO ) 20 MG TABS tablet [id number] Yes Take 1 tablet (20 mg total) by mouth daily with supper. Newlin, Enobong, MD  Active   sacubitril -valsartan  (ENTRESTO ) 24-26 MG [id number] Yes Take 1 tablet by mouth 2 (two) times daily. Tan,  Letha, MD  Active     Discontinued 04/04/20 1029             Recommendation:   PCP Follow-up Continue Current Plan of Care  Follow Up Plan:   Telephone follow up appointment date/time:  04/01/2024 @ 1:30 PM   Olam Ku, RN, BSN [place]  [facility], [facility] RN Care Manager Direct Dial: [phone]  Fax: [phone]

## 2024-03-23 ENCOUNTER — Ambulatory Visit (HOSPITAL_COMMUNITY): Admitting: Anesthesiology

## 2024-03-23 ENCOUNTER — Other Ambulatory Visit: Payer: Self-pay

## 2024-03-23 ENCOUNTER — Encounter (HOSPITAL_COMMUNITY)
Admission: RE | Disposition: A | Discharge status: Home / Self Care | Payer: Self-pay | Source: Home / Self Care | Attending: Gastroenterology

## 2024-03-23 ENCOUNTER — Encounter (HOSPITAL_COMMUNITY): Payer: Self-pay | Admitting: Gastroenterology

## 2024-03-23 ENCOUNTER — Ambulatory Visit (HOSPITAL_COMMUNITY)
Admission: RE | Admit: 2024-03-23 | Discharge: 2024-03-23 | Disposition: A | Discharge status: Home / Self Care | Attending: Gastroenterology | Admitting: Gastroenterology

## 2024-03-23 DIAGNOSIS — I4891 Unspecified atrial fibrillation: Secondary | ICD-10-CM | POA: Diagnosis not present

## 2024-03-23 DIAGNOSIS — D509 Iron deficiency anemia, unspecified: Secondary | ICD-10-CM

## 2024-03-23 DIAGNOSIS — Z8673 Personal history of transient ischemic attack (TIA), and cerebral infarction without residual deficits: Secondary | ICD-10-CM | POA: Insufficient documentation

## 2024-03-23 DIAGNOSIS — K648 Other hemorrhoids: Secondary | ICD-10-CM | POA: Insufficient documentation

## 2024-03-23 DIAGNOSIS — I10 Essential (primary) hypertension: Secondary | ICD-10-CM

## 2024-03-23 DIAGNOSIS — K635 Polyp of colon: Secondary | ICD-10-CM

## 2024-03-23 DIAGNOSIS — K5520 Angiodysplasia of colon without hemorrhage: Secondary | ICD-10-CM

## 2024-03-23 DIAGNOSIS — Z800 Family history of malignant neoplasm of digestive organs: Secondary | ICD-10-CM

## 2024-03-23 DIAGNOSIS — J449 Chronic obstructive pulmonary disease, unspecified: Secondary | ICD-10-CM | POA: Diagnosis not present

## 2024-03-23 DIAGNOSIS — K219 Gastro-esophageal reflux disease without esophagitis: Secondary | ICD-10-CM | POA: Insufficient documentation

## 2024-03-23 DIAGNOSIS — D120 Benign neoplasm of cecum: Secondary | ICD-10-CM | POA: Diagnosis not present

## 2024-03-23 DIAGNOSIS — D122 Benign neoplasm of ascending colon: Secondary | ICD-10-CM | POA: Insufficient documentation

## 2024-03-23 DIAGNOSIS — E1151 Type 2 diabetes mellitus with diabetic peripheral angiopathy without gangrene: Secondary | ICD-10-CM | POA: Insufficient documentation

## 2024-03-23 DIAGNOSIS — Z9981 Dependence on supplemental oxygen: Secondary | ICD-10-CM | POA: Insufficient documentation

## 2024-03-23 DIAGNOSIS — I110 Hypertensive heart disease with heart failure: Secondary | ICD-10-CM | POA: Insufficient documentation

## 2024-03-23 DIAGNOSIS — Z1211 Encounter for screening for malignant neoplasm of colon: Secondary | ICD-10-CM | POA: Diagnosis not present

## 2024-03-23 DIAGNOSIS — I5042 Chronic combined systolic (congestive) and diastolic (congestive) heart failure: Secondary | ICD-10-CM | POA: Diagnosis not present

## 2024-03-23 DIAGNOSIS — I2510 Atherosclerotic heart disease of native coronary artery without angina pectoris: Secondary | ICD-10-CM | POA: Diagnosis not present

## 2024-03-23 HISTORY — PX: COLONOSCOPY: SHX5424

## 2024-03-23 LAB — GLUCOSE, CAPILLARY
Glucose-Capillary: 74 mg/dL (ref 70–99)
Glucose-Capillary: 78 mg/dL (ref 70–99)

## 2024-03-23 SURGERY — COLONOSCOPY
Anesthesia: Monitor Anesthesia Care

## 2024-03-23 MED ORDER — SODIUM CHLORIDE 0.9 % IV SOLN: INTRAVENOUS | Status: DC | PRN

## 2024-03-23 MED ORDER — SODIUM CHLORIDE 0.9 % IV SOLN: INTRAVENOUS | Status: DC

## 2024-03-23 MED ORDER — PROPOFOL 500 MG/50ML IV EMUL
INTRAVENOUS | Status: DC | PRN
  Administered 2024-03-23: 100 ug/kg/min via INTRAVENOUS

## 2024-03-23 NOTE — Discharge Instructions (Addendum)
 Resume your Xarelto  (rivaroxaban ) at your usual dose tomorrow, 03/24/24 - Dr. Legrand  _____________________  YOU HAD AN ENDOSCOPIC PROCEDURE TODAY: Refer to the procedure report and other information in the discharge instructions given to you for any specific questions about what was found during the examination. If this information does not answer your questions, please call [place] office at [phone] to clarify.   YOU SHOULD EXPECT: Some feelings of bloating in the abdomen. Passage of more gas than usual. Walking can help get rid of the air that was put into your GI tract during the procedure and reduce the bloating. If you had a lower endoscopy (such as a colonoscopy or flexible sigmoidoscopy) you may notice spotting of blood in your stool or on the toilet paper. Some abdominal soreness may be present for a day or two, also.  DIET: Your first meal following the procedure should be a light meal and then it is ok to progress to your normal diet. A half-sandwich or bowl of soup is an example of a good first meal. Heavy or fried foods are harder to digest and may make you feel nauseous or bloated. Drink plenty of fluids but you should avoid alcoholic beverages for 24 hours. If you had a esophageal dilation, please see attached instructions for diet.    ACTIVITY: Your care partner should take you home directly after the procedure. You should plan to take it easy, moving slowly for the rest of the day. You can resume normal activity the day after the procedure however YOU SHOULD NOT DRIVE, use power tools, machinery or perform tasks that involve climbing or major physical exertion for 24 hours (because of the sedation medicines used during the test).   SYMPTOMS TO REPORT IMMEDIATELY: A gastroenterologist can be reached at any hour. Please call [phone]  for any of the following symptoms:  Following lower endoscopy (colonoscopy, flexible sigmoidoscopy) Excessive amounts of blood in the stool   Significant tenderness, worsening of abdominal pains  Swelling of the abdomen that is new, acute  Fever of 100 or higher   FOLLOW UP:  If any biopsies were taken you will be contacted by phone or by letter within the next 1-3 weeks. Call [phone]  if you have not heard about the biopsies in 3 weeks.  Please also call with any specific questions about appointments or follow up tests.

## 2024-03-23 NOTE — Op Note (Signed)
 [facility] Patient Name: Jared [name] Procedure Date: 03/23/2024 MRN: [id number] Attending MD: Victory CROME. Legrand , MD, [id number] Date of Birth: 12-Jul-1955 CSN: [id number] Age: 68 Admit Type: Inpatient Procedure:                Colonoscopy Indications:              Screening in patient at increased risk: Colorectal                            cancer in mother 44 or older (mid-80's)                           no polyps last colonoscopy July 2020 Providers:                Victory CROME. Legrand, MD, Collene Edu, RN, Curtistine Bishop, Technician Referring MD:              Medicines:                Monitored Anesthesia Care Complications:            No immediate complications. Estimated Blood Loss:     Estimated blood loss was minimal. Procedure:                Pre-Anesthesia Assessment:                           - Prior to the procedure, a History and Physical                            was performed, and patient medications and                            allergies were reviewed. The patient's tolerance of                            previous anesthesia was also reviewed. The risks                            and benefits of the procedure and the sedation                            options and risks were discussed with the patient.                            All questions were answered, and informed consent                            was obtained. Prior Anticoagulants: The patient has                            taken Xarelto  (rivaroxaban ), last dose was 2 days  prior to procedure. ASA Grade Assessment: IV - A                            patient with severe systemic disease that is a                            constant threat to life. After reviewing the risks                            and benefits, the patient was deemed in                            satisfactory condition to undergo the procedure.                           After  obtaining informed consent, the colonoscope                            was passed under direct vision. Throughout the                            procedure, the patient's blood pressure, pulse, and                            oxygen  saturations were monitored continuously. The                            [id number]-HQ190L ([id number]) Olympus colonoscope was                            introduced through the anus and advanced to the the                            cecum, identified by appendiceal orifice and                            ileocecal valve. The colonoscopy was performed                            without difficulty. The patient tolerated the                            procedure well. The quality of the bowel                            preparation was excellent. The ileocecal valve,                            appendiceal orifice, and rectum were photographed. Scope In: 10:00:54 AM Scope Out: 10:15:10 AM Scope Withdrawal Time: 0 hours 12 minutes 5 seconds  Total Procedure Duration: 0 hours 14 minutes 16 seconds  Findings:      The perianal and digital rectal examinations were normal.      Repeat examination of right colon under  NBI performed.      Two sessile polyps were found in the proximal ascending colon and cecum.       The polyps were diminutive in size. These polyps were removed with a       cold snare. Resection and retrieval were complete.      Internal hemorrhoids were found. The hemorrhoids were small.      The exam was otherwise without abnormality on direct and retroflexion       views. Impression:               - Two diminutive polyps in the proximal ascending                            colon and in the cecum, removed with a cold snare.                            Resected and retrieved.                           - Internal hemorrhoids.                           - The examination was otherwise normal on direct                            and retroflexion views. Moderate  Sedation:      MAC sedation used Recommendation:           - Patient has a contact number available for                            emergencies. The signs and symptoms of potential                            delayed complications were discussed with the                            patient. Return to normal activities tomorrow.                            Written discharge instructions were provided to the                            patient.                           - Resume previous diet.                           - Resume Xarelto  (rivaroxaban ) at prior dose                            tomorrow.                           - Await pathology results.                           -  Repeat colonoscopy may be recommended for                            surveillance. That will be determined after                            pathology results from today's exam become                            available for review.                           The two diminutive polypoid mucosal lesions will be                            examined by pathology. If either is adenomatous or                            SSP, then repeat colonoscopy will be recommended in                            5 years.                           If neither is adenomatous or SSP, then no repeat                            colonoscopy will be recommended due to current                            guidelines. Procedure Code(s):        --- Professional ---                           [phone], Colonoscopy, flexible; with removal of                            tumor(s), polyp(s), or other lesion(s) by snare                            technique Diagnosis Code(s):        --- Professional ---                           Z80.0, Family history of malignant neoplasm of                            digestive organs                           D12.2, Benign neoplasm of ascending colon                           D12.0, Benign neoplasm of cecum  K64.8, Other hemorrhoids CPT copyright 2022 [employer]. All rights reserved. The codes documented in this report are preliminary and upon coder review may  be revised to meet current compliance requirements. Renae Mottley L. Legrand, MD 03/23/2024 10:23:59 AM This report has been signed electronically. Number of Addenda: 0

## 2024-03-23 NOTE — H&P (Signed)
 History and Physical:  This patient presents for endoscopic testing for: Family hx colon cancer   68 yo man with fam Hx colon cancer and last colonoscopy July 2020 while inpatient for GI bleeding.  No polyps on that exam. 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  He is currently under the care of Hematology and after most recent endoscopic procedures, IV iron  and bevacizumab  infusions.  Last GI clinic evaluation with us  on 01/17/24, and since then he has had a hospital admission for COPD exacerbation.  Reports that his breathing feels at baseline today without cough or sputum production.  Patient is otherwise without complaints or active issues today.   Past Medical History: Past Medical History:  Diagnosis Date   Acute blood loss anemia 05/18/2023   Acute perforated appendicitis 04/30/2023   AKI (acute kidney injury) 11/18/2020   Atrial fibrillation with RVR (HCC) 05/06/2023   AVM (arteriovenous malformation)    CAD (coronary artery disease)    a. LHC 5/12:  LAD 20, pLCx 20, pRCA 40, dRCA 40, EF 35%, diff HK  //  b. Myoview 4/16: Overall Impression:  High risk stress nuclear study There is no evidence of ischemia.  There is severe LV dysfunction. LV Ejection Fraction: 30%.  LV Wall Motion:  There is global LV hypokinesis.     CAP (community acquired pneumonia) 09/2013   Chronic combined systolic and diastolic CHF (congestive heart failure) (HCC)    a. Echo 4/16:Mild LVH, EF 40-45%, diffuse HK //  b. Echo 8/17: EF 35-40%, diffuse HK, diastolic dysfunction, aortic sclerosis, trivial MR, moderate LAE, normal RVSF, moderate RAE, mild TR, PASP 42 mmHg // c. Echo 4/18: Mild concentric LVH, EF 30-35, normal wall motion, grade 1 diastolic dysfunction, PASP 49   Chronic respiratory failure (HCC)    Cluster headache    hx; haven't had one in awhile (01/09/2016)   COPD (chronic obstructive pulmonary  disease) (HCC)    thelbert 01/09/2016   DM (diabetes mellitus) (HCC)    History of CVA (cerebrovascular accident)    Hypertension    IDA (iron  deficiency anemia)    Moderate tobacco use disorder    NICM (nonischemic cardiomyopathy) (HCC)    Nicotine  addiction    Peripheral vascular disease    Syncope 06/11/2019   Tobacco abuse    Type 2 diabetes mellitus (HCC) 05/14/2016     Past Surgical History: Past Surgical History:  Procedure Laterality Date   ABDOMINAL AORTOGRAM W/LOWER EXTREMITY N/A 12/07/2022   Procedure: ABDOMINAL AORTOGRAM W/LOWER EXTREMITY;  Surgeon: Magda Debby SAILOR, MD;  Location: [facility];  Service: Cardiovascular;  Laterality: N/A;   ABDOMINAL AORTOGRAM W/LOWER EXTREMITY Left 08/02/2023   Procedure: ABDOMINAL AORTOGRAM W/LOWER EXTREMITY;  Surgeon: Magda Debby SAILOR, MD;  Location: [facility];  Service: Cardiovascular;  Laterality: Left;   BALLOON ENTEROSCOPY N/A 11/19/2023   Procedure: ENTEROSCOPY, USING BALLOON;  Surgeon: San Sandor GAILS, DO;  Location: [facility];  Service: Gastroenterology;  Laterality: N/A;  single balloon enteroscopy   BIOPSY  11/12/2020   Procedure: BIOPSY;  Surgeon: Wilhelmenia Aloha Raddle., MD;  Location: [facility];  Service: Gastroenterology;;   CARDIAC CATHETERIZATION  10/2010   LM normal, LAD with 20% irregularities, LCX with 20%, RCA with 40% prox and 40% distal - EF of 35%   CATARACT EXTRACTION, BILATERAL     COLONOSCOPY W/ BIOPSIES AND POLYPECTOMY     COLONOSCOPY WITH  PROPOFOL  N/A 09/06/2018   Procedure: COLONOSCOPY WITH PROPOFOL ;  Surgeon: Eda Iha, MD;  Location: [facility] ENDOSCOPY;  Service: Gastroenterology;  Laterality: N/A;   ENTEROSCOPY N/A 09/28/2018   Procedure: ENTEROSCOPY;  Surgeon: Rollin Dover, MD;  Location: [facility] ENDOSCOPY;  Service: Endoscopy;  Laterality: N/A;   ENTEROSCOPY N/A 10/28/2018   Procedure: ENTEROSCOPY;  Surgeon: Eda Iha, MD;  Location: [facility] ENDOSCOPY;  Service: Gastroenterology;   Laterality: N/A;   ENTEROSCOPY N/A 10/09/2020   Procedure: ENTEROSCOPY;  Surgeon: Abran Norleen SAILOR, MD;  Location: [facility] ENDOSCOPY;  Service: Endoscopy;  Laterality: N/A;   ENTEROSCOPY N/A 03/22/2022   Procedure: ENTEROSCOPY;  Surgeon: Leigh Elspeth SQUIBB, MD;  Location: [facility] ENDOSCOPY;  Service: Gastroenterology;  Laterality: N/A;   ENTEROSCOPY N/A 05/20/2023   Procedure: ENTEROSCOPY;  Surgeon: Legrand Victory LITTIE DOUGLAS, MD;  Location: [facility] ENDOSCOPY;  Service: Gastroenterology;  Laterality: N/A;   ENTEROSCOPY N/A 08/24/2023   Procedure: ENTEROSCOPY;  Surgeon: Suzann [name] HERO, MD;  Location: [facility] ENDOSCOPY;  Service: Gastroenterology;  Laterality: N/A;   ESOPHAGOGASTRODUODENOSCOPY N/A 11/12/2020   Procedure: ESOPHAGOGASTRODUODENOSCOPY (EGD);  Surgeon: Wilhelmenia Aloha Raddle., MD;  Location: [facility];  Service: Gastroenterology;  Laterality: N/A;   ESOPHAGOGASTRODUODENOSCOPY (EGD) WITH PROPOFOL  N/A 09/05/2018   Procedure: ESOPHAGOGASTRODUODENOSCOPY (EGD) WITH PROPOFOL ;  Surgeon: Leigh Elspeth SQUIBB, MD;  Location: [facility] ENDOSCOPY;  Service: Gastroenterology;  Laterality: N/A;   ESOPHAGOGASTRODUODENOSCOPY (EGD) WITH PROPOFOL  N/A 11/19/2020   Procedure: ESOPHAGOGASTRODUODENOSCOPY (EGD) WITH PROPOFOL ;  Surgeon: Albertus Gordy HERO, MD;  Location: [facility];  Service: Gastroenterology;  Laterality: N/A;   ESOPHAGOGASTRODUODENOSCOPY (EGD) WITH PROPOFOL  N/A 12/27/2022   Procedure: ESOPHAGOGASTRODUODENOSCOPY (EGD) WITH PROPOFOL ;  Surgeon: Shila Gustav GAILS, MD;  Location: [facility];  Service: Gastroenterology;  Laterality: N/A;   EXCISION MASS HEAD     GIVENS CAPSULE STUDY N/A 09/06/2018   Procedure: GIVENS CAPSULE STUDY;  Surgeon: Eda Iha, MD;  Location: [facility] ENDOSCOPY;  Service: Gastroenterology;  Laterality: N/A;   GIVENS CAPSULE STUDY N/A 09/26/2018   Procedure: GIVENS CAPSULE STUDY;  Surgeon: Albertus Gordy HERO, MD;  Location: [facility] ENDOSCOPY;  Service: Gastroenterology;  Laterality: N/A;   GIVENS CAPSULE STUDY N/A  06/14/2019   Procedure: GIVENS CAPSULE STUDY;  Surgeon: Shila Gustav GAILS, MD;  Location: [facility];  Service: Endoscopy;  Laterality: N/A;   HEMOSTASIS CLIP PLACEMENT  11/12/2020   Procedure: HEMOSTASIS CLIP PLACEMENT;  Surgeon: Wilhelmenia Aloha Raddle., MD;  Location: [facility];  Service: Gastroenterology;;   HEMOSTASIS CONTROL  11/12/2020   Procedure: HEMOSTASIS CONTROL;  Surgeon: Wilhelmenia Aloha Raddle., MD;  Location: [facility];  Service: Gastroenterology;;   HOT HEMOSTASIS N/A 10/28/2018   Procedure: HOT HEMOSTASIS (ARGON PLASMA COAGULATION/BICAP);  Surgeon: Eda Iha, MD;  Location: [facility] ENDOSCOPY;  Service: Gastroenterology;  Laterality: N/A;   HOT HEMOSTASIS N/A 11/12/2020   Procedure: HOT HEMOSTASIS (ARGON PLASMA COAGULATION/BICAP);  Surgeon: Wilhelmenia Aloha Raddle., MD;  Location: [facility];  Service: Gastroenterology;  Laterality: N/A;   HOT HEMOSTASIS N/A 03/22/2022   Procedure: HOT HEMOSTASIS (ARGON PLASMA COAGULATION/BICAP);  Surgeon: Leigh Elspeth SQUIBB, MD;  Location: [facility] ENDOSCOPY;  Service: Gastroenterology;  Laterality: N/A;   HOT HEMOSTASIS N/A 05/20/2023   Procedure: HOT HEMOSTASIS (ARGON PLASMA COAGULATION/BICAP);  Surgeon: Legrand Victory LITTIE DOUGLAS, MD;  Location: [facility] ENDOSCOPY;  Service: Gastroenterology;  Laterality: N/A;   HOT HEMOSTASIS N/A 08/24/2023   Procedure: EGD, WITH ARGON PLASMA COAGULATION;  Surgeon: Suzann [name] HERO, MD;  Location: [facility] ENDOSCOPY;  Service: Gastroenterology;  Laterality: N/A;   INCISION AND DRAINAGE PERIRECTAL ABSCESS N/A 06/05/2017   Procedure: IRRIGATION  AND DEBRIDEMENT PERIRECTAL ABSCESS;  Surgeon: Teresa Lonni HERO, MD;  Location: [facility] OR;  Service: General;  Laterality: N/A;   IR CATHETER TUBE CHANGE  05/14/2023   LAPAROSCOPIC APPENDECTOMY N/A 05/01/2023   Procedure: APPENDECTOMY LAPAROSCOPIC;  Surgeon: Ebbie Cough, MD;  Location: [facility] OR;  Service: General;  Laterality: N/A;   PERIPHERAL VASCULAR BALLOON ANGIOPLASTY Left 08/02/2023    Procedure: PERIPHERAL VASCULAR BALLOON ANGIOPLASTY;  Surgeon: Magda Debby SAILOR, MD;  Location: [facility];  Service: Cardiovascular;  Laterality: Left;   PERIPHERAL VASCULAR INTERVENTION  12/07/2022   Procedure: PERIPHERAL VASCULAR INTERVENTION;  Surgeon: Magda Debby SAILOR, MD;  Location: [facility];  Service: Cardiovascular;;   PERIPHERAL VASCULAR INTERVENTION Left 08/02/2023   Procedure: PERIPHERAL VASCULAR INTERVENTION;  Surgeon: Magda Debby SAILOR, MD;  Location: [facility];  Service: Cardiovascular;  Laterality: Left;   SUBMUCOSAL TATTOO INJECTION  11/12/2020   Procedure: SUBMUCOSAL TATTOO INJECTION;  Surgeon: Wilhelmenia Aloha Raddle., MD;  Location: [facility];  Service: Gastroenterology;;   SUBMUCOSAL TATTOO INJECTION  05/20/2023   Procedure: SUBMUCOSAL TATTOO INJECTION;  Surgeon: Legrand Victory LITTIE DOUGLAS, MD;  Location: [facility] ENDOSCOPY;  Service: Gastroenterology;;   VIDEO BRONCHOSCOPY Bilateral 05/08/2016   Procedure: VIDEO BRONCHOSCOPY WITH FLUORO;  Surgeon: Harden LULLA Staff, MD;  Location: [facility] ENDOSCOPY;  Service: Cardiopulmonary;  Laterality: Bilateral;    Allergies: Allergies  Allergen Reactions   Lisinopril  Cough    Outpatient Meds: No current facility-administered medications for this encounter.  His home med list was reviewed and is being reconciled by endoscopy admit nurse    ___________________________________________________________________ Objective   Exam:  There were no vitals taken for this visit. Currently being admitted  CV: regular , S1/S2 Resp: no respiratory distress.comfortable, speaking full sentences  Expiratory wheezing bilaterally GI: soft, no tenderness, with active bowel sounds.   Assessment: Family history of colorectal cancer (mother at advanced age - mid 44s')   Plan: Colonoscopy   The benefits and risks of the planned procedure(s) were described in detail with the patient or (when appropriate) their health care proxy.  Risks  were outlined as including, but not limited to, bleeding, infection, perforation, adverse medication reaction leading to cardiac or pulmonary decompensation, pancreatitis (if ERCP).  The limitation of incomplete mucosal visualization was also discussed.  No guarantees or warranties were given. Patient at increased risk for cardiopulmonary complications of procedure due to medical comorbidities.  The patient is appropriate for an endoscopic procedure in the ambulatory setting.   - Victory Legrand, MD

## 2024-03-23 NOTE — Anesthesia Postprocedure Evaluation (Signed)
 Anesthesia Post Note  Patient: Jared [name]  Procedure(s) Performed: COLONOSCOPY     Patient location during evaluation: Endoscopy Anesthesia Type: MAC Level of consciousness: awake and alert Pain management: pain level controlled Vital Signs Assessment: post-procedure vital signs reviewed and stable Respiratory status: spontaneous breathing Cardiovascular status: stable Anesthetic complications: no   No notable events documented.  Last Vitals:  Vitals:   03/23/24 1027 03/23/24 1030  BP:  (!) 145/80  Pulse: 71 80  Resp: 17 18  Temp:    SpO2: 100% 99%    Last Pain:  Vitals:   03/23/24 1030  TempSrc:   PainSc: 0-No pain                 Norleen Pope

## 2024-03-23 NOTE — Interval H&P Note (Signed)
 History and Physical Interval Note:  03/23/2024 9:57 AM  Jared [name]  has presented today for surgery, with the diagnosis of screening colon cancer, oxygen  dependent, family history colon cancer, iron  deficiency anemia, history of small bowel AVM.  The various methods of treatment have been discussed with the patient and family. After consideration of risks, benefits and other options for treatment, the patient has consented to  Procedure(s): COLONOSCOPY (N/A) as a surgical intervention.  The patient's history has been reviewed, patient examined, no change in status, stable for surgery.  I have reviewed the patient's chart and labs.  Questions were answered to the patient's satisfaction.     Victory LITTIE Brand III

## 2024-03-23 NOTE — Anesthesia Preprocedure Evaluation (Addendum)
 Anesthesia Evaluation  Patient identified by MRN, date of birth, ID band Patient awake    Reviewed: Allergy & Precautions, NPO status , Patient's Chart, lab work & pertinent test results, reviewed documented beta blocker date and time   History of Anesthesia Complications Negative for: history of anesthetic complications  Airway Mallampati: I  TM Distance: >3 FB Neck ROM: Limited    Dental  (+) Missing, Dental Advisory Given, Poor Dentition,    Pulmonary pneumonia, COPD (3L),  oxygen  dependent, Current Smoker and Patient abstained from smoking.   breath sounds clear to auscultation + decreased breath sounds(-) wheezing      Cardiovascular hypertension, Pt. on medications and Pt. on home beta blockers + CAD, + Peripheral Vascular Disease and +CHF  + dysrhythmias Atrial Fibrillation  Rhythm:Regular Rate:Normal  Echo 02/2024  1. Left ventricular ejection fraction, by estimation, is 45%. The left  ventricle has mildly decreased function. The left ventricle demonstrates  global hypokinesis. The left ventricular internal cavity size was mildly  dilated. There is mild eccentric left ventricular hypertrophy. Left  ventricular diastolic parameters are consistent with Grade II diastolic  dysfunction (pseudonormalization). Elevated left atrial pressure.   2. Right ventricular systolic function is normal. The right ventricular  size is mildly enlarged. Tricuspid regurgitation signal is inadequate for  assessing PA pressure.   3. Left atrial size was moderately dilated.   4. Right atrial size was moderately dilated.   5. A small pericardial effusion is present. The pericardial effusion is  circumferential. There is no evidence of cardiac tamponade.   6. The mitral valve is normal in structure. Trivial mitral valve  regurgitation. No evidence of mitral stenosis.   7. The aortic valve is tricuspid. There is mild calcification of the  aortic  valve. There is mild thickening of the aortic valve. Aortic valve  regurgitation is not visualized. Aortic valve sclerosis/calcification is  present, without any evidence of aortic stenosis.   8. Aortic dilatation noted. There is borderline dilatation of the aortic  root, measuring 38 mm. There is mild dilatation of the ascending aorta,  measuring 44 mm.   9. The inferior vena cava is dilated in size with <50% respiratory  variability, suggesting right atrial pressure of 15 mmHg.   Comparison(s): No significant change from prior study. Prior images  reviewed side by side. The left ventricular function is unchanged. Left  ventricular systolic function appears to be overestimated on the  05/02/2023 study, but is similar to the 12/13/2021 report.    TTE 05/02/23: EF 55-60%, grade II DD, RV function moderately reduced,  moderate RVE, mild LAE     Neuro/Psych  Headaches PSYCHIATRIC DISORDERS      CVA, No Residual Symptoms    GI/Hepatic Neg liver ROS, PUD,GERD  Medicated and Controlled,,GI bleed   Endo/Other  diabetes, Well Controlled, Type 2    Renal/GU Renal disease     Musculoskeletal   Abdominal   Peds  Hematology  (+) Blood dyscrasia (Hgb 7.4), anemia  On xarelto   Lab Results      Component                Value               Date                               HGB  6.9 (L)             11/15/2023                HCT                      25.7 (L)            11/15/2023   \Lab Results      Component                Value               Date                      NA                       142                 11/19/2023                CL                       97 (L)              11/19/2023                K                        3.7                 11/19/2023                CO2                      35 (H)              11/13/2023                BUN                      12                  11/19/2023                CREATININE               0.80                 11/19/2023                GFRNONAA                 >60                 11/13/2023                CALCIUM                   8.5 (L)             11/13/2023                PHOS                     3.0                 05/20/2023  ALBUMIN                   3.2 (L)             08/22/2023                GLUCOSE                  113 (H)             11/19/2023                        Anesthesia Other Findings All: lisinopril   Reproductive/Obstetrics                              Anesthesia Physical Anesthesia Plan  ASA: 4  Anesthesia Plan: MAC   Post-op Pain Management: Minimal or no pain anticipated   Induction: Intravenous  PONV Risk Score and Plan: 1 and Propofol  infusion, Treatment may vary due to age or medical condition and Ondansetron   Airway Management Planned: Natural Airway  Additional Equipment: None  Intra-op Plan:   Post-operative Plan:   Informed Consent: I have reviewed the patients History and Physical, chart, labs and discussed the procedure including the risks, benefits and alternatives for the proposed anesthesia with the patient or authorized representative who has indicated his/her understanding and acceptance.     Dental advisory given  Plan Discussed with: CRNA  Anesthesia Plan Comments: (Anemia and recurrent AVMs)        Anesthesia Quick Evaluation

## 2024-03-23 NOTE — Transfer of Care (Signed)
 Immediate Anesthesia Transfer of Care Note  Patient: Jared [name]  Procedure(s) Performed: Procedure(s): COLONOSCOPY (N/A)  Patient Location: PACU and Endoscopy Unit  Anesthesia Type:MAC  Level of Consciousness: awake, alert  and oriented  Airway & Oxygen  Therapy: Patient Spontanous Breathing and Patient connected to nasal cannula oxygen   Post-op Assessment: Report given to RN and Post -op Vital signs reviewed and stable  Post vital signs: Reviewed and stable  Last Vitals:  Vitals:   03/23/24 0939  BP: (!) 170/98  Pulse: 83  Resp: 16  Temp: 36.4 C  SpO2: 100%    Complications: No apparent anesthesia complications

## 2024-03-24 ENCOUNTER — Encounter (HOSPITAL_COMMUNITY): Payer: Self-pay | Admitting: Gastroenterology

## 2024-03-24 ENCOUNTER — Ambulatory Visit: Payer: Self-pay | Admitting: Gastroenterology

## 2024-03-24 LAB — SURGICAL PATHOLOGY

## 2024-03-24 NOTE — Telephone Encounter (Signed)
 Letter mailed

## 2024-03-29 DIAGNOSIS — G629 Polyneuropathy, unspecified: Secondary | ICD-10-CM | POA: Diagnosis not present

## 2024-03-29 DIAGNOSIS — J9610 Chronic respiratory failure, unspecified whether with hypoxia or hypercapnia: Secondary | ICD-10-CM | POA: Diagnosis not present

## 2024-03-29 DIAGNOSIS — K219 Gastro-esophageal reflux disease without esophagitis: Secondary | ICD-10-CM | POA: Diagnosis not present

## 2024-03-29 DIAGNOSIS — I4892 Unspecified atrial flutter: Secondary | ICD-10-CM | POA: Diagnosis not present

## 2024-03-29 DIAGNOSIS — Z9989 Dependence on other enabling machines and devices: Secondary | ICD-10-CM | POA: Diagnosis not present

## 2024-03-29 DIAGNOSIS — G4730 Sleep apnea, unspecified: Secondary | ICD-10-CM | POA: Diagnosis not present

## 2024-03-29 DIAGNOSIS — Z7982 Long term (current) use of aspirin: Secondary | ICD-10-CM | POA: Diagnosis not present

## 2024-03-29 DIAGNOSIS — I501 Left ventricular failure: Secondary | ICD-10-CM | POA: Diagnosis not present

## 2024-03-29 DIAGNOSIS — G44229 Chronic tension-type headache, not intractable: Secondary | ICD-10-CM | POA: Diagnosis not present

## 2024-03-29 DIAGNOSIS — E785 Hyperlipidemia, unspecified: Secondary | ICD-10-CM | POA: Diagnosis not present

## 2024-03-29 DIAGNOSIS — I255 Ischemic cardiomyopathy: Secondary | ICD-10-CM | POA: Diagnosis not present

## 2024-03-29 DIAGNOSIS — D6869 Other thrombophilia: Secondary | ICD-10-CM | POA: Diagnosis not present

## 2024-03-29 DIAGNOSIS — I2510 Atherosclerotic heart disease of native coronary artery without angina pectoris: Secondary | ICD-10-CM | POA: Diagnosis not present

## 2024-03-29 DIAGNOSIS — K08409 Partial loss of teeth, unspecified cause, unspecified class: Secondary | ICD-10-CM | POA: Diagnosis not present

## 2024-03-29 DIAGNOSIS — Z7901 Long term (current) use of anticoagulants: Secondary | ICD-10-CM | POA: Diagnosis not present

## 2024-03-29 DIAGNOSIS — I4891 Unspecified atrial fibrillation: Secondary | ICD-10-CM | POA: Diagnosis not present

## 2024-03-29 DIAGNOSIS — Z8673 Personal history of transient ischemic attack (TIA), and cerebral infarction without residual deficits: Secondary | ICD-10-CM | POA: Diagnosis not present

## 2024-03-29 DIAGNOSIS — J449 Chronic obstructive pulmonary disease, unspecified: Secondary | ICD-10-CM | POA: Diagnosis not present

## 2024-03-29 DIAGNOSIS — Z888 Allergy status to other drugs, medicaments and biological substances status: Secondary | ICD-10-CM | POA: Diagnosis not present

## 2024-03-29 DIAGNOSIS — R2681 Unsteadiness on feet: Secondary | ICD-10-CM | POA: Diagnosis not present

## 2024-03-30 ENCOUNTER — Other Ambulatory Visit: Payer: Self-pay | Admitting: Family Medicine

## 2024-03-30 ENCOUNTER — Encounter: Payer: Self-pay | Admitting: Nurse Practitioner

## 2024-03-30 ENCOUNTER — Other Ambulatory Visit: Payer: Self-pay | Admitting: Internal Medicine

## 2024-03-30 ENCOUNTER — Other Ambulatory Visit: Payer: Self-pay

## 2024-03-30 ENCOUNTER — Encounter: Payer: Self-pay | Admitting: Hematology

## 2024-03-30 MED ORDER — FERROUS SULFATE 325 (65 FE) MG PO TABS
325.0000 mg | ORAL_TABLET | Freq: Two times a day (BID) | ORAL | 1 refills | Status: AC
  Filled 2024-03-30 – 2024-03-31 (×2): qty 180, 90d supply, fill #0
  Filled 2024-06-15: qty 180, 90d supply, fill #1

## 2024-03-31 ENCOUNTER — Other Ambulatory Visit: Payer: Self-pay

## 2024-03-31 ENCOUNTER — Encounter: Payer: Self-pay | Admitting: Nurse Practitioner

## 2024-03-31 ENCOUNTER — Encounter: Payer: Self-pay | Admitting: Hematology

## 2024-03-31 MED ORDER — BISOPROLOL FUMARATE 5 MG PO TABS
5.0000 mg | ORAL_TABLET | Freq: Every day | ORAL | 0 refills | Status: DC
  Filled 2024-03-31: qty 30, 30d supply, fill #0

## 2024-03-31 MED ORDER — SACUBITRIL-VALSARTAN 24-26 MG PO TABS
1.0000 | ORAL_TABLET | Freq: Two times a day (BID) | ORAL | 0 refills | Status: DC
  Filled 2024-03-31: qty 60, 30d supply, fill #0

## 2024-03-31 MED ORDER — ISOSORB DINITRATE-HYDRALAZINE 20-37.5 MG PO TABS
1.0000 | ORAL_TABLET | Freq: Three times a day (TID) | ORAL | 0 refills | Status: DC
  Filled 2024-03-31: qty 90, 30d supply, fill #0

## 2024-04-01 ENCOUNTER — Other Ambulatory Visit: Payer: Self-pay

## 2024-04-01 ENCOUNTER — Other Ambulatory Visit (HOSPITAL_COMMUNITY): Payer: Self-pay

## 2024-04-01 ENCOUNTER — Other Ambulatory Visit: Payer: Self-pay | Admitting: *Deleted

## 2024-04-01 NOTE — Patient Instructions (Signed)
 Visit Information  Thank you for taking time to visit with me today. Please don't hesitate to contact me if I can be of assistance to you before our next scheduled appointment.  Your next care management appointment is by telephone on 05/01/2024 at 9:45 AM  Please call the care guide team at [phone] if you need to cancel, schedule, or reschedule an appointment.   Please call the Suicide and Crisis Lifeline: 988 call the [place]  National Suicide Prevention Lifeline: 780-015-0314 or TTY: 802-167-5656 TTY 845-819-0717) to talk to a trained counselor call 1-800-273-TALK (toll free, 24 hour hotline) if you are experiencing a Mental Health or Behavioral Health Crisis or need someone to talk to.  Olam Ku, RN, BSN [place]  [facility], [facility] Health RN Care Manager Direct Dial: [phone]  Fax: [phone]

## 2024-04-01 NOTE — Patient Outreach (Signed)
 Complex Care Management   Visit Note  04/01/2024  Name:  Jared [name] MRN: [id number] DOB: 26-Apr-1956  Situation: Referral received for Complex Care Management related to COPD I obtained verbal consent from Patient.  Visit completed with Patient  on the phone  Background:   Past Medical History:  Diagnosis Date   Acute blood loss anemia 05/18/2023   Acute perforated appendicitis 04/30/2023   AKI (acute kidney injury) 11/18/2020   Atrial fibrillation with RVR (HCC) 05/06/2023   AVM (arteriovenous malformation)    CAD (coronary artery disease)    a. LHC 5/12:  LAD 20, pLCx 20, pRCA 40, dRCA 40, EF 35%, diff HK  //  b. Myoview 4/16: Overall Impression:  High risk stress nuclear study There is no evidence of ischemia.  There is severe LV dysfunction. LV Ejection Fraction: 30%.  LV Wall Motion:  There is global LV hypokinesis.     CAP (community acquired pneumonia) 09/2013   Chronic combined systolic and diastolic CHF (congestive heart failure) (HCC)    a. Echo 4/16:Mild LVH, EF 40-45%, diffuse HK //  b. Echo 8/17: EF 35-40%, diffuse HK, diastolic dysfunction, aortic sclerosis, trivial MR, moderate LAE, normal RVSF, moderate RAE, mild TR, PASP 42 mmHg // c. Echo 4/18: Mild concentric LVH, EF 30-35, normal wall motion, grade 1 diastolic dysfunction, PASP 49   Chronic respiratory failure (HCC)    Cluster headache    hx; haven't had one in awhile (01/09/2016)   COPD (chronic obstructive pulmonary disease) (HCC)    thelbert 01/09/2016   DM (diabetes mellitus) (HCC)    History of CVA (cerebrovascular accident)    Hypertension    IDA (iron  deficiency anemia)    Moderate tobacco use disorder    NICM (nonischemic cardiomyopathy) (HCC)    Nicotine  addiction    Peripheral vascular disease    Syncope 06/11/2019   Tobacco abuse    Type 2 diabetes mellitus (HCC) 05/14/2016    Assessment: Patient Reported Symptoms:  Cognitive Cognitive Status: Able to follow simple commands, Alert and oriented  to person, place, and time, Normal speech and language skills Cognitive/Intellectual Conditions Management [RPT]: None reported or documented in medical history or problem list   Health Maintenance Behaviors: Annual physical exam  Neurological Neurological Review of Symptoms: Headaches (Pt reports he continues to take the Floricet for his ongoing headaches with good relief.) Neurological Management Strategies: Medication therapy, Routine screening Neurological Self-Management Outcome: 4 (good)  HEENT HEENT Symptoms Reported: No symptoms reported HEENT Management Strategies: Routine screening HEENT Comment: Continue to have nasal dryness with occassional nose-bleeds due to use of oxygen -uses nasal spray to help mositure to  the passages.    Cardiovascular Cardiovascular Symptoms Reported: No symptoms reported Does patient have uncontrolled Hypertension?: Yes Is patient checking Blood Pressure at home?: Yes Patient's Recent BP reading at home: Patient reports AM read was 155/90 (wrisit BP device) and he remains asymptomatic with use of his ongong prescribed medications. Patient educated on when to call his provider with parameters in place. Verified pt is taking his BP correctly with his device. Cardiovascular Management Strategies: Medication therapy, Routine screening, Medical device, Adequate rest Weight: 198 lb (89.8 kg) Cardiovascular Self-Management Outcome: 4 (good) Cardiovascular Comment: Denies any irregulat heart beats since last assessment 2 weeks ago. Pt will follow up with his cardiologist (Dr. Okey on 05/16/2024 and a Echocargiogram on 04/21/2024). Pt educated if symptoms worsen to contact his provider.  Respiratory Respiratory Symptoms Reported: No symptoms reported (No more then he usually has with  dry coug and SOB.) Other Respiratory Symptoms: Next follow up with pulmonogist Dr. Darlean on 06/01/2024. Additional Respiratory Details: Patient reports he is down to 5 cig/day with the use  of 10 cigarettes with ongoing use of the Wellbutrin  medication and aware of the prescribed regimen. Respiratory Management Strategies: BiPAP, Oxygen  therapy, Breathing techniques, Coping strategies, Medication therapy, Routine screening, Adequate rest Respiratory Self-Management Outcome: 4 (good)  Endocrine Endocrine Symptoms Reported: No symptoms reported Is patient diabetic?:  (Recent A1C 6.0 -02/24/2024) Is patient checking blood sugars at home?: No Endocrine Self-Management Outcome: 4 (good)  Gastrointestinal Gastrointestinal Symptoms Reported: No symptoms reported      Genitourinary Genitourinary Symptoms Reported: No symptoms reported    Integumentary Integumentary Symptoms Reported: No symptoms reported Additional Integumentary Details: Aware and continued to educate on Xarelto  concerning bleed precautions and safety Skin Management Strategies: Routine screening, Medication therapy, Coping strategies, Adequate rest Skin Comment: Patient aware to contacct his provider Dr. Lanny concerning his hemotology concerns  Musculoskeletal Musculoskelatal Symptoms Reviewed: No symptoms reported Musculoskeletal Management Strategies: Routine screening, Medication therapy, Coping strategies Musculoskeletal Comment: uses assistive devices      Psychosocial Psychosocial Symptoms Reported: No symptoms reported          Vitals:   04/01/24 1422  BP: (!) 155/90    Medications Reviewed Today     Reviewed by Alvia Olam BIRCH, RN (Registered Nurse) on 04/01/24 at 1410  Med List Status: <None>   Medication Order Taking? Sig Documenting Provider Last Dose Status Informant  albuterol  (PROVENTIL ) (2.5 MG/3ML) 0.083% nebulizer solution [id number] Yes Take 3 mLs (2.5 mg total) by nebulization every 2 (two) hours as needed for wheezing or shortness of breath. Darlean Ozell NOVAK, MD  Active Self, Pharmacy Records  albuterol  (VENTOLIN  HFA) 108 [phone] Base) MCG/ACT inhaler [id number] Yes INHALE 2 PUFFS INTO THE  LUNGS EVERY 6 (SIX) HOURS AS NEEDED FOR WHEEZING OR SHORTNESS OF BREATH. Newlin, Enobong, MD  Active Self, Pharmacy Records  aspirin  EC 81 MG tablet [id number] Yes Take 81 mg by mouth daily. Swallow whole. [provider]  Active Self, Pharmacy Records  atorvastatin  (LIPITOR ) 80 MG tablet [id number] Yes Take 1 tablet (80 mg total) by mouth daily. Newlin, Enobong, MD  Active Self, Pharmacy Records           Med Note MURRAY, KATAWBBA   Wed Dec 11, 2023  1:23 PM)    bisoprolol  (ZEBETA ) 5 MG tablet [id number] Yes Take 1 tablet (5 mg total) by mouth daily. Newlin, Enobong, MD  Active   budesonide  (PULMICORT ) 0.5 MG/2ML nebulizer solution [id number] Yes Take 2 mLs (0.5 mg total) by nebulization 2 (two) times daily. Waymond Cart, MD  Active   budesonide -glycopyrrolate -formoterol  (BREZTRI  AEROSPHERE) 160-9-4.8 MCG/ACT AERO inhaler [id number] Yes Take 2 puffs first thing in morning and then another 2 puffs about 12 hours later. Darlean Ozell NOVAK, MD  Active Pharmacy Records, Self  buPROPion  (WELLBUTRIN  SR) 150 MG 12 hr tablet [id number] Yes Take 1 tablet (150 mg total) by mouth daily for 3 days, THEN 1 tablet (150 mg total) 2 (two) times daily thereafter for smoking cessation. Newlin, Enobong, MD  Active   butalbital -acetaminophen -caffeine  (FIORICET ) 50-325-40 MG tablet [id number] Yes Take 1 tablet by mouth every 12 (twelve) hours as needed for headache. Newlin, Enobong, MD  Active   DULoxetine  (CYMBALTA ) 60 MG capsule [id number] Yes Take 1 capsule (60 mg total) by mouth daily. For chronic leg pains Delbert Clam, MD  Active   ergocalciferol  (DRISDOL ) 1.25 MG (50000 UT) capsule [id number]  Yes Take 1 capsule (50,000 Units total) by mouth once a week. Newlin, Enobong, MD  Active Self, Pharmacy Records  ezetimibe  (ZETIA ) 10 MG tablet [id number] Yes Take 1 tablet (10 mg total) by mouth daily. Okey Vina GAILS, MD  Active Self, Pharmacy Records  ferrous sulfate  [facility]) 325 (65 FE) MG tablet [id number] Yes Take 1 tablet  (325 mg total) by mouth 2 (two) times daily with a meal. Delbert Clam, MD  Active   fluticasone  (FLONASE ) 50 MCG/ACT nasal spray [id number] Yes Place 2 sprays into both nostrils daily. Newlin, Enobong, MD  Active   folic acid  (FOLVITE ) 1 MG tablet [id number] Yes Take 1 tablet (1 mg total) by mouth daily. Newlin, Enobong, MD  Active Self, Pharmacy Records  furosemide  (LASIX ) 40 MG tablet [id number] Yes Take 1 tablet (40 mg total) by mouth 2 (two) times daily. Newlin, Enobong, MD  Active   gabapentin  (NEURONTIN ) 300 MG capsule [id number] Yes Take 1 capsule (300 mg total) by mouth at bedtime. For leg pains Delbert Clam, MD  Active Self, Pharmacy Records  ipratropium-albuterol  (DUONEB) 0.5-2.5 (3) MG/3ML SOLN [id number] Yes Take 3 mLs by nebulization every 6 (six) hours. Waymond Cart, MD  Active   isosorbide -hydrALAZINE  (BIDIL ) 20-37.5 MG tablet [id number] Yes Take 1 tablet by mouth 3 (three) times daily. Newlin, Enobong, MD  Active   nitroGLYCERIN  (NITROSTAT ) 0.4 MG SL tablet [id number] Yes Place 0.4 mg under the tongue every 5 (five) minutes as needed for chest pain. [provider]  Active Pharmacy Records, Self  OXYGEN  [id number] Yes Inhale 2 L/min into the lungs continuous.  Patient taking differently: Inhale 4 L/min into the lungs continuous.   [provider]  Active Self, Pharmacy Records  pantoprazole  (PROTONIX ) 40 MG tablet [id number] Yes Take 1 tablet (40 mg total) by mouth daily. Newlin, Enobong, MD  Active   rivaroxaban  (XARELTO ) 20 MG TABS tablet [id number] Yes Take 1 tablet (20 mg total) by mouth daily with supper. Newlin, Enobong, MD  Active   sacubitril -valsartan  (ENTRESTO ) 24-26 MG [id number] Yes Take 1 tablet by mouth 2 (two) times daily. Newlin, Enobong, MD  Active     Discontinued 04/04/20 1029             Recommendation:   PCP Follow-up Continue Current Plan of Care  Follow Up Plan:   Telephone follow up appointment date/time:  05/01/2024@ 09:45 AM   Olam Ku, RN, BSN [place]  [facility] RN Care Manager Direct Dial: [phone]  Fax: [phone]

## 2024-04-02 ENCOUNTER — Other Ambulatory Visit: Payer: Self-pay

## 2024-04-02 ENCOUNTER — Other Ambulatory Visit (HOSPITAL_COMMUNITY): Payer: Self-pay

## 2024-04-02 MED ORDER — EZETIMIBE 10 MG PO TABS
10.0000 mg | ORAL_TABLET | Freq: Every day | ORAL | 3 refills | Status: AC
  Filled 2024-04-02: qty 90, 90d supply, fill #0
  Filled 2024-06-15 – 2024-06-16 (×2): qty 90, 90d supply, fill #1

## 2024-04-15 ENCOUNTER — Encounter: Payer: Self-pay | Admitting: Nurse Practitioner

## 2024-04-15 ENCOUNTER — Encounter: Payer: Self-pay | Admitting: Hematology

## 2024-04-15 ENCOUNTER — Inpatient Hospital Stay: Attending: Nurse Practitioner

## 2024-04-15 DIAGNOSIS — D500 Iron deficiency anemia secondary to blood loss (chronic): Secondary | ICD-10-CM | POA: Diagnosis not present

## 2024-04-15 DIAGNOSIS — K274 Chronic or unspecified peptic ulcer, site unspecified, with hemorrhage: Secondary | ICD-10-CM | POA: Diagnosis present

## 2024-04-15 DIAGNOSIS — D649 Anemia, unspecified: Secondary | ICD-10-CM

## 2024-04-15 LAB — CBC WITH DIFFERENTIAL (CANCER CENTER ONLY)
Abs Immature Granulocytes: 0.02 K/uL (ref 0.00–0.07)
Basophils Absolute: 0 K/uL (ref 0.0–0.1)
Basophils Relative: 0 %
Eosinophils Absolute: 0.1 K/uL (ref 0.0–0.5)
Eosinophils Relative: 2 %
HCT: 45.1 % (ref 39.0–52.0)
Hemoglobin: 13 g/dL (ref 13.0–17.0)
Immature Granulocytes: 0 %
Lymphocytes Relative: 25 %
Lymphs Abs: 1.5 K/uL (ref 0.7–4.0)
MCH: 26.5 pg (ref 26.0–34.0)
MCHC: 28.8 g/dL — ABNORMAL LOW (ref 30.0–36.0)
MCV: 92 fL (ref 80.0–100.0)
Monocytes Absolute: 0.5 K/uL (ref 0.1–1.0)
Monocytes Relative: 8 %
Neutro Abs: 4.1 K/uL (ref 1.7–7.7)
Neutrophils Relative %: 65 %
Platelet Count: 193 K/uL (ref 150–400)
RBC: 4.9 MIL/uL (ref 4.22–5.81)
RDW: 15.8 % — ABNORMAL HIGH (ref 11.5–15.5)
WBC Count: 6.2 K/uL (ref 4.0–10.5)
nRBC: 0 % (ref 0.0–0.2)

## 2024-04-15 LAB — IRON AND IRON BINDING CAPACITY (CC-WL,HP ONLY)
Iron: 71 ug/dL (ref 45–182)
Saturation Ratios: 31 % (ref 17.9–39.5)
TIBC: 227 ug/dL — ABNORMAL LOW (ref 250–450)
UIBC: 156 ug/dL (ref 117–376)

## 2024-04-15 LAB — FERRITIN: Ferritin: 113 ng/mL (ref 24–336)

## 2024-04-21 ENCOUNTER — Ambulatory Visit (HOSPITAL_COMMUNITY)
Admission: RE | Admit: 2024-04-21 | Discharge: 2024-04-21 | Disposition: A | Discharge status: Home / Self Care | Source: Ambulatory Visit | Attending: Cardiovascular Disease | Admitting: Cardiovascular Disease

## 2024-04-21 ENCOUNTER — Ambulatory Visit: Payer: Self-pay | Admitting: Physician Assistant

## 2024-04-21 DIAGNOSIS — I77810 Thoracic aortic ectasia: Secondary | ICD-10-CM | POA: Insufficient documentation

## 2024-04-21 DIAGNOSIS — I519 Heart disease, unspecified: Secondary | ICD-10-CM | POA: Diagnosis not present

## 2024-04-21 DIAGNOSIS — J449 Chronic obstructive pulmonary disease, unspecified: Secondary | ICD-10-CM | POA: Diagnosis not present

## 2024-04-21 DIAGNOSIS — I509 Heart failure, unspecified: Secondary | ICD-10-CM | POA: Insufficient documentation

## 2024-04-21 DIAGNOSIS — E119 Type 2 diabetes mellitus without complications: Secondary | ICD-10-CM | POA: Diagnosis not present

## 2024-04-21 DIAGNOSIS — I429 Cardiomyopathy, unspecified: Secondary | ICD-10-CM | POA: Insufficient documentation

## 2024-04-21 DIAGNOSIS — I110 Hypertensive heart disease with heart failure: Secondary | ICD-10-CM | POA: Insufficient documentation

## 2024-04-21 DIAGNOSIS — F17210 Nicotine dependence, cigarettes, uncomplicated: Secondary | ICD-10-CM | POA: Insufficient documentation

## 2024-04-21 DIAGNOSIS — I2510 Atherosclerotic heart disease of native coronary artery without angina pectoris: Secondary | ICD-10-CM | POA: Diagnosis not present

## 2024-04-21 LAB — ECHOCARDIOGRAM LIMITED
Area-P 1/2: 3.83 cm2
S' Lateral: 4.57 cm

## 2024-04-24 ENCOUNTER — Ambulatory Visit: Payer: Self-pay

## 2024-04-24 ENCOUNTER — Other Ambulatory Visit: Payer: Self-pay

## 2024-04-24 ENCOUNTER — Encounter: Payer: Self-pay | Admitting: Acute Care

## 2024-04-24 MED ORDER — TOPIRAMATE 25 MG PO TABS
ORAL_TABLET | ORAL | 1 refills | Status: DC
  Filled 2024-04-24: qty 60, 30d supply, fill #0

## 2024-04-24 NOTE — Telephone Encounter (Signed)
 Patient states he has not noticed a change in headaches since last OV in September, 2025.  Denies photophobia, phonophobia, N/V, dizziness, vertigo, blurred vision, fever, or falls. He states he has been taking the Fioricet  and still having headaches.   States he has some sinus/ allergies going on still. Using flonase  and nasal spray. Denies using Afrin.    Advised to go to [facility] or ED if sx's worsen.  Verbalized understaniding.  Advised that message would be routed to PCP and have scheduled an VV for Monday at 1:30.

## 2024-04-24 NOTE — Telephone Encounter (Signed)
 FYI Only or Action Required?: Action required by provider: clinical question for provider and update on patient condition.  Patient was last seen in primary care on 02/24/2024 by Delbert Clam, MD.  Called Nurse Triage reporting Medication Problem.  Symptoms began about a month ago.  Interventions attempted: Prescription medications: Fioricet .  Symptoms are: gradually worsening.  Triage Disposition: See PCP When Office is Open (Within 3 Days)  Patient/caregiver understands and will follow disposition?: Yes      Copied from CRM #[id number]. Topic: Clinical - Prescription Issue >> Apr 24, 2024  9:40 AM Travis F wrote: Reason for CRM: Patient is calling in because he was prescribed butalbital -acetaminophen -caffeine  (FIORICET ) 50-325-40 MG tablet [[id number]] and it has not helped him with his headaches. Patient is requesting another medication be sent in to the [facility]. Please advise.      Reason for Disposition  Prescription request for new medicine (not a refill)  Answer Assessment - Initial Assessment Questions Patient reports that the Fioricet  has not been helping relieve his headaches and wanted to know if anything else could be prescribed. Patient offered an appointment but declined stating he has one scheduled for next month. Please advise.     1. NAME of MEDICINE: What medicine(s) are you calling about?     butalbital -acetaminophen -caffeine  (FIORICET ) 50-325-40 MG tablet  2. QUESTION: What is your question? (e.g., double dose of medicine, side effect)     It has not been helping his headaches 3. PRESCRIBER: Who prescribed the medicine? Reason: if prescribed by specialist, call should be referred to that group.     Dr. Newlin  4. SYMPTOMS: Do you have any symptoms? If Yes, ask: What symptoms are you having?  How bad are the symptoms (e.g., mild, moderate, severe)     Intermittent headaches  Protocols used: Medication Question Call-A-AH

## 2024-04-24 NOTE — Addendum Note (Signed)
 Addended by: Oaklynn Stierwalt on: 04/24/2024 04:54 PM   Modules accepted: Orders

## 2024-04-24 NOTE — Telephone Encounter (Signed)
 I explained to him that his headaches are a possible side effect of his cardiac medications Hydralazine  and Isosorbide  and he might want to share that with his Cardiologist. I sent a Prescription for Topamax to see if that will help with his symptoms.

## 2024-04-27 ENCOUNTER — Encounter: Payer: Self-pay | Admitting: Family Medicine

## 2024-04-27 ENCOUNTER — Encounter: Payer: Self-pay | Admitting: Nurse Practitioner

## 2024-04-27 ENCOUNTER — Encounter: Payer: Self-pay | Admitting: Hematology

## 2024-04-27 ENCOUNTER — Other Ambulatory Visit: Payer: Self-pay

## 2024-04-27 ENCOUNTER — Ambulatory Visit: Attending: Family Medicine | Admitting: Family Medicine

## 2024-04-27 DIAGNOSIS — G4440 Drug-induced headache, not elsewhere classified, not intractable: Secondary | ICD-10-CM

## 2024-04-27 MED ORDER — TOPIRAMATE 25 MG PO TABS
ORAL_TABLET | ORAL | 1 refills | Status: AC
  Filled 2024-06-15: qty 60, 30d supply, fill #0
  Filled 2024-07-13: qty 60, 30d supply, fill #1

## 2024-04-27 MED ORDER — CETIRIZINE HCL 10 MG PO TABS
10.0000 mg | ORAL_TABLET | Freq: Every day | ORAL | 1 refills | Status: DC
  Filled 2024-04-27: qty 30, 30d supply, fill #0
  Filled 2024-06-15: qty 30, 30d supply, fill #1

## 2024-04-27 NOTE — Progress Notes (Signed)
 Virtual Visit via Audio Note  I connected with Jared [name], on 04/27/2024 at 5:27 PM by video enabled telemedicine device and verified that I am speaking with the correct person using two identifiers.   Clinician has audio - video capabilities but patient was only able to operate/preferred audio.  Consent: I discussed the limitations, risks, security and privacy concerns of performing an evaluation and management service by telemedicine and the availability of in person appointments. I also discussed with the patient that there may be a patient responsible charge related to this service. The patient expressed understanding and agreed to proceed.   Location of Patient: Home  Location of Provider: Clinic   Persons participating in Telemedicine visit: [facility] Dr. Delbert    Discussed the use of AI scribe software for clinical note transcription with the patient, who gave verbal consent to proceed.  History of Present Illness Jared [name] is a 68 year old male with   a history of  hypertension, COPD, tobacco abuse, NICM , CHF (EF 55-60% from echo 04/2023), atrial flutter, chronic respiratory failure with hypoxia (currently on 2-3 L of oxygen ), multiple hospitalizations for GI bleed with secondary anemia due to AV malformations, peptic ulcer disease, previous history of CVA, Type 2 DM, PAD status post left femoral-popliteal angioplasty and stenting (in 11/2022), status post laparoscopy appendectomy in 04/2023, iron  deficiency anemia (currently receiving IV iron  infusion weekly). who presents with persistent headaches.  He experiences chronic headaches, primarily in the mornings, without light sensitivity, nausea, or vomiting. He has nasal congestion and a runny nose, using saline solution and Flonase  nasal spray. There is no facial pain upon palpation of the cheekbones and forehead. He is on hydralazine  and isosorbide  for his CHF. At his last visit I had prescribed Fioricet  which he  states has been ineffective..      Past Medical History:  Diagnosis Date   Acute blood loss anemia 05/18/2023   Acute perforated appendicitis 04/30/2023   AKI (acute kidney injury) 11/18/2020   Atrial fibrillation with RVR (HCC) 05/06/2023   AVM (arteriovenous malformation)    CAD (coronary artery disease)    a. LHC 5/12:  LAD 20, pLCx 20, pRCA 40, dRCA 40, EF 35%, diff HK  //  b. Myoview 4/16: Overall Impression:  High risk stress nuclear study There is no evidence of ischemia.  There is severe LV dysfunction. LV Ejection Fraction: 30%.  LV Wall Motion:  There is global LV hypokinesis.     CAP (community acquired pneumonia) 09/2013   Chronic combined systolic and diastolic CHF (congestive heart failure) (HCC)    a. Echo 4/16:Mild LVH, EF 40-45%, diffuse HK //  b. Echo 8/17: EF 35-40%, diffuse HK, diastolic dysfunction, aortic sclerosis, trivial MR, moderate LAE, normal RVSF, moderate RAE, mild TR, PASP 42 mmHg // c. Echo 4/18: Mild concentric LVH, EF 30-35, normal wall motion, grade 1 diastolic dysfunction, PASP 49   Chronic respiratory failure (HCC)    Cluster headache    hx; haven't had one in awhile (01/09/2016)   COPD (chronic obstructive pulmonary disease) (HCC)    thelbert 01/09/2016   DM (diabetes mellitus) (HCC)    History of CVA (cerebrovascular accident)    Hypertension    IDA (iron  deficiency anemia)    Moderate tobacco use disorder    NICM (nonischemic cardiomyopathy) (HCC)    Nicotine  addiction    Peripheral vascular disease    Syncope 06/11/2019   Tobacco abuse    Type 2 diabetes mellitus (HCC) 05/14/2016  Allergies  Allergen Reactions   Lisinopril  Cough    Current Outpatient Medications on File Prior to Visit  Medication Sig Dispense Refill   albuterol  (PROVENTIL ) (2.5 MG/3ML) 0.083% nebulizer solution Take 3 mLs (2.5 mg total) by nebulization every 2 (two) hours as needed for wheezing or shortness of breath. 75 mL 1   albuterol  (VENTOLIN  HFA) 108 (90 Base)  MCG/ACT inhaler INHALE 2 PUFFS INTO THE LUNGS EVERY 6 (SIX) HOURS AS NEEDED FOR WHEEZING OR SHORTNESS OF BREATH. 18 g 6   aspirin  EC 81 MG tablet Take 81 mg by mouth daily. Swallow whole.     atorvastatin  (LIPITOR ) 80 MG tablet Take 1 tablet (80 mg total) by mouth daily. 90 tablet 1   bisoprolol  (ZEBETA ) 5 MG tablet Take 1 tablet (5 mg total) by mouth daily. 30 tablet 0   budesonide  (PULMICORT ) 0.5 MG/2ML nebulizer solution Take 2 mLs (0.5 mg total) by nebulization 2 (two) times daily. 120 mL 0   budesonide -glycopyrrolate -formoterol  (BREZTRI  AEROSPHERE) 160-9-4.8 MCG/ACT AERO inhaler Take 2 puffs first thing in morning and then another 2 puffs about 12 hours later. 10.7 g 11   buPROPion  (WELLBUTRIN  SR) 150 MG 12 hr tablet Take 1 tablet (150 mg total) by mouth daily for 3 days, THEN 1 tablet (150 mg total) 2 (two) times daily thereafter for smoking cessation. 60 tablet 3   butalbital -acetaminophen -caffeine  (FIORICET ) 50-325-40 MG tablet Take 1 tablet by mouth every 12 (twelve) hours as needed for headache. 30 tablet 1   DULoxetine  (CYMBALTA ) 60 MG capsule Take 1 capsule (60 mg total) by mouth daily. For chronic leg pains 90 capsule 1   ergocalciferol  (DRISDOL ) 1.25 MG (50000 UT) capsule Take 1 capsule (50,000 Units total) by mouth once a week. 12 capsule 1   ezetimibe  (ZETIA ) 10 MG tablet Take 1 tablet (10 mg total) by mouth daily. 90 tablet 3   ferrous sulfate  (FEROSUL) 325 (65 FE) MG tablet Take 1 tablet (325 mg total) by mouth 2 (two) times daily with a meal. 180 tablet 1   fluticasone  (FLONASE ) 50 MCG/ACT nasal spray Place 2 sprays into both nostrils daily. 16 g 6   folic acid  (FOLVITE ) 1 MG tablet Take 1 tablet (1 mg total) by mouth daily. 90 tablet 1   furosemide  (LASIX ) 40 MG tablet Take 1 tablet (40 mg total) by mouth 2 (two) times daily. 180 tablet 1   gabapentin  (NEURONTIN ) 300 MG capsule Take 1 capsule (300 mg total) by mouth at bedtime. For leg pains 90 capsule 1   ipratropium-albuterol   (DUONEB) 0.5-2.5 (3) MG/3ML SOLN Take 3 mLs by nebulization every 6 (six) hours. 360 mL 0   isosorbide -hydrALAZINE  (BIDIL ) 20-37.5 MG tablet Take 1 tablet by mouth 3 (three) times daily. 90 tablet 0   nitroGLYCERIN  (NITROSTAT ) 0.4 MG SL tablet Place 0.4 mg under the tongue every 5 (five) minutes as needed for chest pain.     OXYGEN  Inhale 2 L/min into the lungs continuous. (Patient taking differently: Inhale 4 L/min into the lungs continuous.)     pantoprazole  (PROTONIX ) 40 MG tablet Take 1 tablet (40 mg total) by mouth daily. 90 tablet 1   rivaroxaban  (XARELTO ) 20 MG TABS tablet Take 1 tablet (20 mg total) by mouth daily with supper. 90 tablet 1   sacubitril -valsartan  (ENTRESTO ) 24-26 MG Take 1 tablet by mouth 2 (two) times daily. 60 tablet 0   [DISCONTINUED] TUDORZA PRESSAIR  400 MCG/ACT AEPB INHALE 1 PUFF INTO THE LUNGS IN THE MORNING AND AT BEDTIME. 1 each  11   No current facility-administered medications on file prior to visit.    ROS: See HPI  Observations/Objective: Awake, alert, oriented x3 Not in acute distress Normal mood      Latest Ref Rng & Units 03/11/2024    1:19 PM 03/11/2024    1:06 PM 02/13/2024    2:18 AM  CMP  Glucose 70 - 99 mg/dL 893  891  866   BUN 8 - 23 mg/dL 9  8  15    Creatinine 0.61 - 1.24 mg/dL 9.09  9.17  9.22   Sodium 135 - 145 mmol/L 144  142  145   Potassium 3.5 - 5.1 mmol/L 3.7  3.5  4.3   Chloride 98 - 111 mmol/L 96  97  96   CO2 22 - 32 mmol/L  35  44   Calcium  8.9 - 10.3 mg/dL  9.1  8.7     Lipid Panel     Component Value Date/Time   CHOL 127 10/25/2022 1027   TRIG 80 10/25/2022 1027   HDL 49 10/25/2022 1027   CHOLHDL 2.9 01/29/2020 1020   CHOLHDL 2.8 01/11/2016 0446   VLDL 14 01/11/2016 0446   LDLCALC 62 10/25/2022 1027   LABVLDL 16 10/25/2022 1027    Lab Results  Component Value Date   HGBA1C 6.0 02/24/2024     Assessment and plan:  Assessment & Plan 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 Previous treatment ineffective. No improvement with current regimen. - Prescribed Topamax, one tablet nightly for one week, then increase to one tablet twice daily. - Advised to inform cardiologist if headaches persist for possible medication adjustment. -Sinus etiology also possibility - Continue Flonase  nasal spray. - Prescribed Zyrtec  for sinus symptoms.      Meds ordered this encounter  Medications   cetirizine  (ZYRTEC ) 10 MG tablet    Sig: Take 1 tablet (10 mg total) by mouth daily.    Dispense:  30 tablet    Refill:  1   topiramate (TOPAMAX) 25 MG tablet    Sig: Take 1 tablet by mouth daily at night for 1 week  then increase to 1 tablet twice daily    Dispense:  60 tablet    Refill:  1    Follow Up Instructions: Keep previously scheduled appointment   I discussed the assessment and treatment plan with the patient. The patient was provided an opportunity to ask questions and all were answered. The patient agreed with the plan and demonstrated an understanding of the instructions.   The patient was advised to call back or seek an in-person evaluation if the symptoms worsen or if the condition fails to improve as anticipated.     I provided 11 minutes total of Telehealth time during this encounter including median intraservice time, reviewing previous notes, investigations, ordering medications, medical decision making, coordinating care and patient verbalized understanding at the end of the visit.     Corrina Sabin, MD, FAAFP. [facility] [place], [place] [phone]   04/27/2024, 5:27 PM

## 2024-04-28 ENCOUNTER — Other Ambulatory Visit: Payer: Self-pay

## 2024-05-01 ENCOUNTER — Other Ambulatory Visit: Payer: Self-pay | Admitting: *Deleted

## 2024-05-01 NOTE — Patient Instructions (Signed)
 Visit Information  Thank you for taking time to visit with me today. Please don't hesitate to contact me if I can be of assistance to you before our next scheduled appointment.  Your next care management appointment is by telephone on 05/29/2024 at 1130 am  Please call the care guide team at [phone] if you need to cancel, schedule, or reschedule an appointment.   Please call the Suicide and Crisis Lifeline: 988 call the [place]  National Suicide Prevention Lifeline: 289-202-3860 or TTY: (503) 211-3455 TTY (515)283-9006) to talk to a trained counselor call 1-800-273-TALK (toll free, 24 hour hotline) if you are experiencing a Mental Health or Behavioral Health Crisis or need someone to talk to.   Olam Ku, RN, BSN [place]  [facility], [facility] RN Care Manager Direct Dial: [phone]  Fax: [phone]

## 2024-05-01 NOTE — Patient Outreach (Signed)
 Complex Care Management   Visit Note  05/01/2024  Name:  Jared [name] MRN: [id number] DOB: [birth year]  Situation: Referral received for Complex Care Management related to COPD I obtained verbal consent from Patient.  Visit completed with Patient  on the phone  Background:   Past Medical History:  Diagnosis Date   Acute blood loss anemia 05/18/2023   Acute perforated appendicitis 04/30/2023   AKI (acute kidney injury) 11/18/2020   Atrial fibrillation with RVR (HCC) 05/06/2023   AVM (arteriovenous malformation)    CAD (coronary artery disease)    a. LHC 5/12:  LAD 20, pLCx 20, pRCA 40, dRCA 40, EF 35%, diff HK  //  b. Myoview 4/16: Overall Impression:  High risk stress nuclear study There is no evidence of ischemia.  There is severe LV dysfunction. LV Ejection Fraction: 30%.  LV Wall Motion:  There is global LV hypokinesis.     CAP (community acquired pneumonia) 09/2013   Chronic combined systolic and diastolic CHF (congestive heart failure) (HCC)    a. Echo 4/16:Mild LVH, EF 40-45%, diffuse HK //  b. Echo 8/17: EF 35-40%, diffuse HK, diastolic dysfunction, aortic sclerosis, trivial MR, moderate LAE, normal RVSF, moderate RAE, mild TR, PASP 42 mmHg // c. Echo 4/18: Mild concentric LVH, EF 30-35, normal wall motion, grade 1 diastolic dysfunction, PASP 49   Chronic respiratory failure (HCC)    Cluster headache    hx; haven't had one in awhile (01/09/2016)   COPD (chronic obstructive pulmonary disease) (HCC)    thelbert 01/09/2016   DM (diabetes mellitus) (HCC)    History of CVA (cerebrovascular accident)    Hypertension    IDA (iron  deficiency anemia)    Moderate tobacco use disorder    NICM (nonischemic cardiomyopathy) (HCC)    Nicotine  addiction    Peripheral vascular disease    Syncope 06/11/2019   Tobacco abuse    Type 2 diabetes mellitus (HCC) 05/14/2016    Assessment: Pt disappointed with the current PCS ([employer]) for personal aide services. He states there has been several  changes with aides in the home and he is not please. RNCM encouraged pt to contact the company directly and express his thoughts and concerns with any request to resolved this issues (receptive to this plan). No other issues presented at this time. Patient Reported Symptoms:  Cognitive Cognitive Status: Able to follow simple commands, Alert and oriented to person, place, and time, Normal speech and language skills   Health Maintenance Behaviors: Annual physical exam  Neurological Neurological Review of Symptoms: Headaches (Pt reports he continues to take the Floricet for his ongoing headaches.) Neurological Management Strategies: Medication therapy, Coping strategies, Routine screening Neurological Comment: Pt states ongoing headaches has a new prescription but not sure of the medication. RNCM offered pharmacy referral ro assist with education on all his medication however pt declined at this time and will alert me when he is comfortable when he needs further assistance. Pt will add OTC medication and aware to check with his provider prior to any new OTC medication.  HEENT HEENT Symptoms Reported: No symptoms reported HEENT Management Strategies: Routine screening    Cardiovascular Cardiovascular Symptoms Reported: No symptoms reported Does patient have uncontrolled Hypertension?: Yes Is patient checking Blood Pressure at home?: Yes Patient's Recent BP reading at home: Report morning bp on home devices was 116/71 (wrisit bp device) and pt remains asymptomatic. Cardiovascular Management Strategies: Coping strategies, Routine screening, Medication therapy Weight: 186 lb (84.4 kg) Cardiovascular Self-Management Outcome: 4 (good) Cardiovascular  Comment: Pending Dr. Okey (CAD) on 05/16/2024 and recent Echocardiogram results with be discussed at that time.  Respiratory Respiratory Symptoms Reported: No symptoms reported Other Respiratory Symptoms: Pending pulmonlogist Dr. Darlean 06/01/2024 and pt  continue to take his medications Additional Respiratory Details: Pt continue to smoke however reduced down to 3-4 cig daily and continue to take his Wellbutrin  medication. Respiratory Management Strategies: BiPAP, Medication therapy, Routine screening, Coping strategies, Oxygen  therapy, Adequate rest (Pt also continue his regimen of nebulzer therapy) Respiratory Self-Management Outcome: 4 (good)  Endocrine Endocrine Symptoms Reported: No symptoms reported Is patient diabetic?: No Is patient checking blood sugars at home?: No Endocrine Self-Management Outcome: 4 (good)  Gastrointestinal Gastrointestinal Symptoms Reported: No symptoms reported      Genitourinary Genitourinary Symptoms Reported: No symptoms reported Genitourinary Self-Management Outcome: 4 (good)  Integumentary Integumentary Symptoms Reported: No symptoms reported Additional Integumentary Details: Aware to continue to use precauationary measures taking the medication Xarelto . Skin Management Strategies: Coping strategies, Routine screening, Adequate rest Skin Self-Management Outcome: 4 (good) Skin Comment: Dr. Lanny continue to be available with hemotology concerns.  Musculoskeletal Musculoskelatal Symptoms Reviewed: No symptoms reported Musculoskeletal Management Strategies: Medication therapy, Routine screening, Coping strategies      Psychosocial Psychosocial Symptoms Reported: No symptoms reported Behavioral Management Strategies: Medication therapy Behavioral Health Self-Management Outcome: 4 (good)   Quality of Family Relationships: helpful, involved, supportive     Today's Vitals   05/01/24 1005  Weight: 186 lb (84.4 kg)   Pain Scale: 0-10 Pain Score: 0-No pain  Medications Reviewed Today     Reviewed by Alvia Olam BIRCH, RN (Registered Nurse) on 05/01/24 at 0957  Med List Status: <None>   Medication Order Taking? Sig Documenting Provider Last Dose Status Informant  albuterol  (PROVENTIL ) (2.5 MG/3ML)  0.083% nebulizer solution [id number] Yes Take 3 mLs (2.5 mg total) by nebulization every 2 (two) hours as needed for wheezing or shortness of breath. Darlean Ozell NOVAK, MD  Active Self, Pharmacy Records  albuterol  (VENTOLIN  HFA) 108 [phone] Base) MCG/ACT inhaler [id number] Yes INHALE 2 PUFFS INTO THE LUNGS EVERY 6 (SIX) HOURS AS NEEDED FOR WHEEZING OR SHORTNESS OF BREATH. Newlin, Enobong, MD  Active Self, Pharmacy Records  aspirin  EC 81 MG tablet [id number] Yes Take 81 mg by mouth daily. Swallow whole. [provider]  Active Self, Pharmacy Records  atorvastatin  (LIPITOR ) 80 MG tablet [id number] Yes Take 1 tablet (80 mg total) by mouth daily. Newlin, Enobong, MD  Active Self, Pharmacy Records           Med Note MURRAY, KATAWBBA   Wed Dec 11, 2023  1:23 PM)    bisoprolol  (ZEBETA ) 5 MG tablet [id number] Yes Take 1 tablet (5 mg total) by mouth daily. Newlin, Enobong, MD  Active   budesonide  (PULMICORT ) 0.5 MG/2ML nebulizer solution [id number] Yes Take 2 mLs (0.5 mg total) by nebulization 2 (two) times daily. Waymond Cart, MD  Active   budesonide -glycopyrrolate -formoterol  (BREZTRI  AEROSPHERE) 160-9-4.8 MCG/ACT AERO inhaler [id number] Yes Take 2 puffs first thing in morning and then another 2 puffs about 12 hours later. Darlean Ozell NOVAK, MD  Active Pharmacy Records, Self  buPROPion  (WELLBUTRIN  SR) 150 MG 12 hr tablet [id number] Yes Take 1 tablet (150 mg total) by mouth daily for 3 days, THEN 1 tablet (150 mg total) 2 (two) times daily thereafter for smoking cessation. Newlin, Enobong, MD  Active   butalbital -acetaminophen -caffeine  (FIORICET ) 50-325-40 MG tablet [id number] Yes Take 1 tablet by mouth every 12 (twelve) hours as needed for headache. Newlin, Enobong, MD  Active   cetirizine  (ZYRTEC ) 10 MG tablet [id number] Yes Take 1 tablet (10 mg total) by mouth daily. Newlin, Enobong, MD  Active   DULoxetine  (CYMBALTA ) 60 MG capsule [id number] Yes Take 1 capsule (60 mg total) by mouth daily. For chronic leg pains Delbert Clam, MD  Active   ergocalciferol  (DRISDOL ) 1.25 MG (50000 UT) capsule [id number] Yes Take 1 capsule (50,000 Units total) by mouth once a week. Newlin, Enobong, MD  Active Self, Pharmacy Records  ezetimibe  (ZETIA ) 10 MG tablet [id number] Yes Take 1 tablet (10 mg total) by mouth daily. Okey Vina GAILS, MD  Active   ferrous sulfate  (FEROSUL) 325 (65 FE) MG tablet [id number] Yes Take 1 tablet (325 mg total) by mouth 2 (two) times daily with a meal. Delbert Clam, MD  Active   fluticasone  (FLONASE ) 50 MCG/ACT nasal spray [id number] Yes Place 2 sprays into both nostrils daily. Newlin, Enobong, MD  Active   folic acid  (FOLVITE ) 1 MG tablet [id number] Yes Take 1 tablet (1 mg total) by mouth daily. Newlin, Enobong, MD  Active Self, Pharmacy Records  furosemide  (LASIX ) 40 MG tablet [id number] Yes Take 1 tablet (40 mg total) by mouth 2 (two) times daily. Newlin, Enobong, MD  Active   gabapentin  (NEURONTIN ) 300 MG capsule [id number] Yes Take 1 capsule (300 mg total) by mouth at bedtime. For leg pains Delbert Clam, MD  Active Self, Pharmacy Records  ipratropium-albuterol  (DUONEB) 0.5-2.5 (3) MG/3ML SOLN [id number] Yes Take 3 mLs by nebulization every 6 (six) hours. Waymond Cart, MD  Active   isosorbide -hydrALAZINE  (BIDIL ) 20-37.5 MG tablet [id number] Yes Take 1 tablet by mouth 3 (three) times daily. Newlin, Enobong, MD  Active   nitroGLYCERIN  (NITROSTAT ) 0.4 MG SL tablet [id number] Yes Place 0.4 mg under the tongue every 5 (five) minutes as needed for chest pain. [provider]  Active Pharmacy Records, Self  OXYGEN  [id number] Yes Inhale 2 L/min into the lungs continuous.  Patient taking differently: Inhale 4 L/min into the lungs continuous.   [provider]  Active Self, Pharmacy Records  pantoprazole  (PROTONIX ) 40 MG tablet [id number] Yes Take 1 tablet (40 mg total) by mouth daily. Newlin, Enobong, MD  Active   rivaroxaban  (XARELTO ) 20 MG TABS tablet [id number] Yes Take 1 tablet (20 mg total) by  mouth daily with supper. Newlin, Enobong, MD  Active   sacubitril -valsartan  (ENTRESTO ) 24-26 MG [id number] Yes Take 1 tablet by mouth 2 (two) times daily. Newlin, Enobong, MD  Active   topiramate  (TOPAMAX ) 25 MG tablet [id number] Yes Take 1 tablet by mouth daily at night for 1 week  then increase to 1 tablet twice daily Delbert Clam, MD  Active     Discontinued 04/04/20 1029             Recommendation:   PCP Follow-up Continue Current Plan of Care  Follow Up Plan:   Telephone follow up appointment date/time:  12/19/20205 @ 1130 am   Olam Ku, RN, BSN [place]  [facility], [facility] RN Care Manager Direct Dial: [phone]  Fax: [phone]

## 2024-05-06 ENCOUNTER — Ambulatory Visit
Admission: RE | Admit: 2024-05-06 | Discharge: 2024-05-06 | Disposition: A | Discharge status: Home / Self Care | Source: Ambulatory Visit

## 2024-05-06 DIAGNOSIS — R911 Solitary pulmonary nodule: Secondary | ICD-10-CM

## 2024-05-06 DIAGNOSIS — Z87891 Personal history of nicotine dependence: Secondary | ICD-10-CM

## 2024-05-13 ENCOUNTER — Inpatient Hospital Stay: Attending: Nurse Practitioner

## 2024-05-13 ENCOUNTER — Other Ambulatory Visit: Payer: Self-pay

## 2024-05-13 ENCOUNTER — Other Ambulatory Visit: Payer: Self-pay | Admitting: Family Medicine

## 2024-05-13 DIAGNOSIS — D649 Anemia, unspecified: Secondary | ICD-10-CM

## 2024-05-13 DIAGNOSIS — D500 Iron deficiency anemia secondary to blood loss (chronic): Secondary | ICD-10-CM | POA: Diagnosis present

## 2024-05-13 DIAGNOSIS — K274 Chronic or unspecified peptic ulcer, site unspecified, with hemorrhage: Secondary | ICD-10-CM | POA: Diagnosis present

## 2024-05-13 LAB — CBC WITH DIFFERENTIAL (CANCER CENTER ONLY)
Abs Immature Granulocytes: 0.02 K/uL (ref 0.00–0.07)
Basophils Absolute: 0 K/uL (ref 0.0–0.1)
Basophils Relative: 0 %
Eosinophils Absolute: 0.1 K/uL (ref 0.0–0.5)
Eosinophils Relative: 1 %
HCT: 40 % (ref 39.0–52.0)
Hemoglobin: 12.3 g/dL — ABNORMAL LOW (ref 13.0–17.0)
Immature Granulocytes: 0 %
Lymphocytes Relative: 19 %
Lymphs Abs: 1.7 K/uL (ref 0.7–4.0)
MCH: 27.6 pg (ref 26.0–34.0)
MCHC: 30.8 g/dL (ref 30.0–36.0)
MCV: 89.7 fL (ref 80.0–100.0)
Monocytes Absolute: 0.6 K/uL (ref 0.1–1.0)
Monocytes Relative: 7 %
Neutro Abs: 6.3 K/uL (ref 1.7–7.7)
Neutrophils Relative %: 73 %
Platelet Count: 196 K/uL (ref 150–400)
RBC: 4.46 MIL/uL (ref 4.22–5.81)
RDW: 15.6 % — ABNORMAL HIGH (ref 11.5–15.5)
WBC Count: 8.8 K/uL (ref 4.0–10.5)
nRBC: 0 % (ref 0.0–0.2)

## 2024-05-13 LAB — FERRITIN: Ferritin: 201 ng/mL (ref 24–336)

## 2024-05-13 LAB — IRON AND IRON BINDING CAPACITY (CC-WL,HP ONLY)
Iron: 69 ug/dL (ref 45–182)
Saturation Ratios: 28 % (ref 17.9–39.5)
TIBC: 242 ug/dL — ABNORMAL LOW (ref 250–450)
UIBC: 173 ug/dL

## 2024-05-13 MED ORDER — BISOPROLOL FUMARATE 5 MG PO TABS
5.0000 mg | ORAL_TABLET | Freq: Every day | ORAL | 1 refills | Status: AC
  Filled 2024-05-13: qty 90, 90d supply, fill #0

## 2024-05-14 ENCOUNTER — Other Ambulatory Visit: Payer: Self-pay

## 2024-05-15 ENCOUNTER — Other Ambulatory Visit: Payer: Self-pay

## 2024-05-22 ENCOUNTER — Other Ambulatory Visit: Payer: Self-pay | Admitting: Acute Care

## 2024-05-22 DIAGNOSIS — F17210 Nicotine dependence, cigarettes, uncomplicated: Secondary | ICD-10-CM

## 2024-05-22 DIAGNOSIS — Z122 Encounter for screening for malignant neoplasm of respiratory organs: Secondary | ICD-10-CM

## 2024-05-22 DIAGNOSIS — Z87891 Personal history of nicotine dependence: Secondary | ICD-10-CM

## 2024-05-24 NOTE — Progress Notes (Unsigned)
 Cardiology Office Note   Date:  05/26/2024   ID:  Jared [name], DOB 03-Aug-1955, MRN [id number]  PCP:  Delbert Clam, MD  Cardiologist:   Vina Gull, MD   Patient presents for f/u of CHF and atrial flutter      History of Present Illness: Jared [name] is a 68 y.o. male with a history of  CAD, HFrEF, HTN, HL, CVAparoxysmal atrial flutter, COPD (on O2), tobacco use   2012  LHC  Mild CAD 2016  Myoview  No ischemia  2018  Echo LVEF 30 to 35% 2019 echo LVEF 45 to 50%      June 2024 PTA/stent to L fempop       Seen in ER for anemia   EGD showed no active bleed  Undergoing Fe infusions  I saw the pt in Fall 2024   He was last seen by VEAR Ford in Sept 2025 Echo in Nov 2025 LVEF 40 to 45%  The pt denies CP  No dizziness Breathing is stable   No palpitations  No F/C  Occasional cough with white sputum, sl blood at times   On O2   No outpatient medications have been marked as taking for the 05/26/24 encounter (Office Visit) with Gull Vina GAILS, MD.     Allergies:   Lisinopril    Past Medical History:  Diagnosis Date   Acute blood loss anemia 05/18/2023   Acute perforated appendicitis 04/30/2023   AKI (acute kidney injury) 11/18/2020   Atrial fibrillation with RVR (HCC) 05/06/2023   AVM (arteriovenous malformation)    CAD (coronary artery disease)    a. LHC 5/12:  LAD 20, pLCx 20, pRCA 40, dRCA 40, EF 35%, diff HK  //  b. Myoview 4/16: Overall Impression:  High risk stress nuclear study There is no evidence of ischemia.  There is severe LV dysfunction. LV Ejection Fraction: 30%.  LV Wall Motion:  There is global LV hypokinesis.     CAP (community acquired pneumonia) 09/2013   Chronic combined systolic and diastolic CHF (congestive heart failure) (HCC)    a. Echo 4/16:Mild LVH, EF 40-45%, diffuse HK //  b. Echo 8/17: EF 35-40%, diffuse HK, diastolic dysfunction, aortic sclerosis, trivial MR, moderate LAE, normal RVSF, moderate RAE, mild TR, PASP 42 mmHg // c. Echo 4/18: Mild  concentric LVH, EF 30-35, normal wall motion, grade 1 diastolic dysfunction, PASP 49   Chronic respiratory failure (HCC)    Cluster headache    hx; haven't had one in awhile (01/09/2016)   COPD (chronic obstructive pulmonary disease) (HCC)    thelbert 01/09/2016   DM (diabetes mellitus) (HCC)    History of CVA (cerebrovascular accident)    Hypertension    IDA (iron  deficiency anemia)    Moderate tobacco use disorder    NICM (nonischemic cardiomyopathy) (HCC)    Nicotine  addiction    Peripheral vascular disease    Syncope 06/11/2019   Tobacco abuse    Type 2 diabetes mellitus (HCC) 05/14/2016    Past Surgical History:  Procedure Laterality Date   ABDOMINAL AORTOGRAM W/LOWER EXTREMITY N/A 12/07/2022   Procedure: ABDOMINAL AORTOGRAM W/LOWER EXTREMITY;  Surgeon: Magda Debby SAILOR, MD;  Location: [facility];  Service: Cardiovascular;  Laterality: N/A;   ABDOMINAL AORTOGRAM W/LOWER EXTREMITY Left 08/02/2023   Procedure: ABDOMINAL AORTOGRAM W/LOWER EXTREMITY;  Surgeon: Magda Debby SAILOR, MD;  Location: [facility];  Service: Cardiovascular;  Laterality: Left;   BALLOON ENTEROSCOPY N/A 11/19/2023   Procedure: ENTEROSCOPY, USING BALLOON;  Surgeon: San Sandor GAILS, DO;  Location: [facility];  Service: Gastroenterology;  Laterality: N/A;  single balloon enteroscopy   BIOPSY  11/12/2020   Procedure: BIOPSY;  Surgeon: Wilhelmenia Aloha Raddle., MD;  Location: [facility];  Service: Gastroenterology;;   CARDIAC CATHETERIZATION  10/2010   LM normal, LAD with 20% irregularities, LCX with 20%, RCA with 40% prox and 40% distal - EF of 35%   CATARACT EXTRACTION, BILATERAL     COLONOSCOPY N/A 03/23/2024   Procedure: COLONOSCOPY;  Surgeon: Legrand Victory LITTIE DOUGLAS, MD;  Location: [facility];  Service: Gastroenterology;  Laterality: N/A;   COLONOSCOPY W/ BIOPSIES AND POLYPECTOMY     COLONOSCOPY WITH PROPOFOL  N/A 09/06/2018   Procedure: COLONOSCOPY WITH PROPOFOL ;  Surgeon: Eda Iha, MD;   Location: [facility] ENDOSCOPY;  Service: Gastroenterology;  Laterality: N/A;   ENTEROSCOPY N/A 09/28/2018   Procedure: ENTEROSCOPY;  Surgeon: Rollin Dover, MD;  Location: [facility] ENDOSCOPY;  Service: Endoscopy;  Laterality: N/A;   ENTEROSCOPY N/A 10/28/2018   Procedure: ENTEROSCOPY;  Surgeon: Eda Iha, MD;  Location: [facility] ENDOSCOPY;  Service: Gastroenterology;  Laterality: N/A;   ENTEROSCOPY N/A 10/09/2020   Procedure: ENTEROSCOPY;  Surgeon: Abran Norleen SAILOR, MD;  Location: [facility] ENDOSCOPY;  Service: Endoscopy;  Laterality: N/A;   ENTEROSCOPY N/A 03/22/2022   Procedure: ENTEROSCOPY;  Surgeon: Leigh Elspeth SQUIBB, MD;  Location: [facility] ENDOSCOPY;  Service: Gastroenterology;  Laterality: N/A;   ENTEROSCOPY N/A 05/20/2023   Procedure: ENTEROSCOPY;  Surgeon: Legrand Victory LITTIE DOUGLAS, MD;  Location: [facility] ENDOSCOPY;  Service: Gastroenterology;  Laterality: N/A;   ENTEROSCOPY N/A 08/24/2023   Procedure: ENTEROSCOPY;  Surgeon: Suzann [name] HERO, MD;  Location: [facility] ENDOSCOPY;  Service: Gastroenterology;  Laterality: N/A;   ESOPHAGOGASTRODUODENOSCOPY N/A 11/12/2020   Procedure: ESOPHAGOGASTRODUODENOSCOPY (EGD);  Surgeon: Wilhelmenia Aloha Raddle., MD;  Location: [facility];  Service: Gastroenterology;  Laterality: N/A;   ESOPHAGOGASTRODUODENOSCOPY (EGD) WITH PROPOFOL  N/A 09/05/2018   Procedure: ESOPHAGOGASTRODUODENOSCOPY (EGD) WITH PROPOFOL ;  Surgeon: Leigh Elspeth SQUIBB, MD;  Location: [facility];  Service: Gastroenterology;  Laterality: N/A;   ESOPHAGOGASTRODUODENOSCOPY (EGD) WITH PROPOFOL  N/A 11/19/2020   Procedure: ESOPHAGOGASTRODUODENOSCOPY (EGD) WITH PROPOFOL ;  Surgeon: Albertus Gordy HERO, MD;  Location: [facility];  Service: Gastroenterology;  Laterality: N/A;   ESOPHAGOGASTRODUODENOSCOPY (EGD) WITH PROPOFOL  N/A 12/27/2022   Procedure: ESOPHAGOGASTRODUODENOSCOPY (EGD) WITH PROPOFOL ;  Surgeon: Shila Gustav GAILS, MD;  Location: [facility];  Service: Gastroenterology;  Laterality: N/A;   EXCISION MASS HEAD     GIVENS CAPSULE  STUDY N/A 09/06/2018   Procedure: GIVENS CAPSULE STUDY;  Surgeon: Eda Iha, MD;  Location: [facility] ENDOSCOPY;  Service: Gastroenterology;  Laterality: N/A;   GIVENS CAPSULE STUDY N/A 09/26/2018   Procedure: GIVENS CAPSULE STUDY;  Surgeon: Albertus Gordy HERO, MD;  Location: [facility] ENDOSCOPY;  Service: Gastroenterology;  Laterality: N/A;   GIVENS CAPSULE STUDY N/A 06/14/2019   Procedure: GIVENS CAPSULE STUDY;  Surgeon: Shila Gustav GAILS, MD;  Location: [facility];  Service: Endoscopy;  Laterality: N/A;   HEMOSTASIS CLIP PLACEMENT  11/12/2020   Procedure: HEMOSTASIS CLIP PLACEMENT;  Surgeon: Wilhelmenia Aloha Raddle., MD;  Location: [facility];  Service: Gastroenterology;;   HEMOSTASIS CONTROL  11/12/2020   Procedure: HEMOSTASIS CONTROL;  Surgeon: Wilhelmenia Aloha Raddle., MD;  Location: [facility];  Service: Gastroenterology;;   HOT HEMOSTASIS N/A 10/28/2018   Procedure: HOT HEMOSTASIS (ARGON PLASMA COAGULATION/BICAP);  Surgeon: Eda Iha, MD;  Location: [facility] ENDOSCOPY;  Service: Gastroenterology;  Laterality: N/A;   HOT HEMOSTASIS N/A 11/12/2020   Procedure: HOT HEMOSTASIS (ARGON PLASMA COAGULATION/BICAP);  Surgeon: Wilhelmenia Aloha Raddle.,  MD;  Location: [facility];  Service: Gastroenterology;  Laterality: N/A;   HOT HEMOSTASIS N/A 03/22/2022   Procedure: HOT HEMOSTASIS (ARGON PLASMA COAGULATION/BICAP);  Surgeon: Leigh Elspeth SQUIBB, MD;  Location: [facility] ENDOSCOPY;  Service: Gastroenterology;  Laterality: N/A;   HOT HEMOSTASIS N/A 05/20/2023   Procedure: HOT HEMOSTASIS (ARGON PLASMA COAGULATION/BICAP);  Surgeon: Legrand Victory LITTIE DOUGLAS, MD;  Location: [facility] ENDOSCOPY;  Service: Gastroenterology;  Laterality: N/A;   HOT HEMOSTASIS N/A 08/24/2023   Procedure: EGD, WITH ARGON PLASMA COAGULATION;  Surgeon: Suzann [name] HERO, MD;  Location: [facility] ENDOSCOPY;  Service: Gastroenterology;  Laterality: N/A;   INCISION AND DRAINAGE PERIRECTAL ABSCESS N/A 06/05/2017   Procedure: IRRIGATION AND DEBRIDEMENT PERIRECTAL ABSCESS;   Surgeon: Teresa Lonni HERO, MD;  Location: [facility];  Service: General;  Laterality: N/A;   IR CATHETER TUBE CHANGE  05/14/2023   LAPAROSCOPIC APPENDECTOMY N/A 05/01/2023   Procedure: APPENDECTOMY LAPAROSCOPIC;  Surgeon: Ebbie Cough, MD;  Location: [facility] OR;  Service: General;  Laterality: N/A;   PERIPHERAL VASCULAR BALLOON ANGIOPLASTY Left 08/02/2023   Procedure: PERIPHERAL VASCULAR BALLOON ANGIOPLASTY;  Surgeon: Magda Debby SAILOR, MD;  Location: [facility];  Service: Cardiovascular;  Laterality: Left;   PERIPHERAL VASCULAR INTERVENTION  12/07/2022   Procedure: PERIPHERAL VASCULAR INTERVENTION;  Surgeon: Magda Debby SAILOR, MD;  Location: [facility];  Service: Cardiovascular;;   PERIPHERAL VASCULAR INTERVENTION Left 08/02/2023   Procedure: PERIPHERAL VASCULAR INTERVENTION;  Surgeon: Magda Debby SAILOR, MD;  Location: [facility];  Service: Cardiovascular;  Laterality: Left;   SUBMUCOSAL TATTOO INJECTION  11/12/2020   Procedure: SUBMUCOSAL TATTOO INJECTION;  Surgeon: Wilhelmenia Aloha Raddle., MD;  Location: [facility];  Service: Gastroenterology;;   SUBMUCOSAL TATTOO INJECTION  05/20/2023   Procedure: SUBMUCOSAL TATTOO INJECTION;  Surgeon: Legrand Victory LITTIE DOUGLAS, MD;  Location: [facility] ENDOSCOPY;  Service: Gastroenterology;;   VIDEO BRONCHOSCOPY Bilateral 05/08/2016   Procedure: VIDEO BRONCHOSCOPY WITH FLUORO;  Surgeon: Harden LULLA Staff, MD;  Location: [facility] ENDOSCOPY;  Service: Cardiopulmonary;  Laterality: Bilateral;     Social History:  The patient  reports that he has been smoking cigarettes. He has a 23.5 pack-year smoking history. He has never used smokeless tobacco. He reports that he does not currently use alcohol. He reports that he does not use drugs.   Family History:  The patient's family history includes Cancer in his father; Colon cancer in his mother; Diabetes in his brother, mother, and sister; Heart disease in his mother; Liver cancer in his mother.    ROS:  Please see the  history of present illness. All other systems are reviewed and  Negative to the above problem except as noted.    PHYSICAL EXAM: VS:  BP (!) 79/40   Pulse 68   Ht 6' 2 (1.88 m)   Wt 187 lb (84.8 kg)   SpO2 92%   BMI 24.01 kg/m   Bilateral arms 78-79/ on recheck On my check 88/54 L arm    GEN: Well nourished, well developed,  Examined in chair   ON O2   HEENT: normal  Neck:JVP is not elevated  Cardiac: RRR; no murmurs  No  LE edema  Respiratory:  Decreased air flow  GI: soft   Nontender   EKG:  EKG is not ordered today.  Echo:    NOv 2025 1. Left ventricular ejection fraction, by estimation, is 40 to 45%. The  left ventricle has mildly decreased function. The left ventricle  demonstrates global hypokinesis. There is severe concentric left  ventricular hypertrophy. Left ventricular diastolic   parameters are indeterminate.   2. Right ventricular systolic function is normal. The right ventricular  size is normal. Tricuspid regurgitation signal is inadequate for assessing  PA pressure.   3. The mitral valve is normal in structure. Trivial mitral valve  regurgitation. No evidence of mitral stenosis.   4. The aortic valve is normal in structure. Aortic valve regurgitation is  not visualized. No aortic stenosis is present.   5. The inferior vena cava is normal in size with greater than 50%  respiratory variability, suggesting right atrial pressure of 3 mmHg.    Monitor Sept 2025  Impression:   Sinus rhythm   Rates 50 to 97 bpm  Average HR 71 bpm Frequent PACs (11.1% total) Rare PVCs 5 runs VT, fastest 8 beats at max rate 190 bpm, longest 9 beats with average rate 158 bpm 849 bursts SVT, fastest for 10 beats at maximum rate 174 bpm, longest 1 min 24 seconds at 137 bpm     Triggered events, diary entries correlated with sinus rhythm, sinus rhythm with PAC, sinus rhythm with PVC July 2023 1. Left ventricular ejection fraction, by estimation, is 45 to 50%. The  left  ventricle has mildly decreased function. The left ventricle  demonstrates global hypokinesis. There is mild concentric left ventricular  hypertrophy. Left ventricular diastolic  parameters are consistent with Grade I diastolic dysfunction (impaired  relaxation).   2. Right ventricular systolic function is normal. The right ventricular  size is normal.   3. The mitral valve is normal in structure. No evidence of mitral valve  regurgitation. No evidence of mitral stenosis.   4. The aortic valve is normal in structure. Aortic valve regurgitation is  not visualized. Aortic valve sclerosis/calcification is present, without  any evidence of aortic stenosis.   5. The inferior vena cava is normal in size with greater than 50%  respiratory variability, suggesting right atrial pressure of 3 mmHg.  Lipid Panel    Component Value Date/Time   CHOL 127 10/25/2022 1027   TRIG 80 10/25/2022 1027   HDL 49 10/25/2022 1027   CHOLHDL 2.9 01/29/2020 1020   CHOLHDL 2.8 01/11/2016 0446   VLDL 14 01/11/2016 0446   LDLCALC 62 10/25/2022 1027      Wt Readings from Last 3 Encounters:  05/26/24 187 lb (84.8 kg)  05/01/24 186 lb (84.4 kg)  04/01/24 198 lb (89.8 kg)      ASSESSMENT AND PLAN:  1  Hypotension  BP very low  He denies dizzines  Does not want to go to ER  WIll not  WIll get CBC, BMET stat  Set up for CXR Encoruaged fluid intake  He says he is drinking fluids  Stop ENtresto  and BiDil    Follow BP at home    Will call tomorrow  2  HFmrEF   Recent echo LVEF 45 to 50%   Volume status appears OK but BP low Hold meds as noted     Will check labs   3 CAD   Cath 2012 mild  Remote myoview in 2016 showed no ischemia  Pt without pain  Control risk factors    4 COPD   Pt on chronic O2  Get CXR   Keep on O2    5   Atrial flutter.    Paroxysmal  Needs to stay on Xarelto     Check hemoglobin     6  Dyslipidemia.  Contiue lipitor   and Zetia    Check  lipids  7  History of CVA.  Continue Xarelto       8  Hx anemia  Check CBC   9  GI   Hx of bleeding   PT denies recent problems  Check CBC   10  Tob  Counselled on cessation   Current medicines are reviewed at length with the patient today.  The patient does not have concerns regarding medicines.  Signed, Vina Gull, MD  05/26/2024 10:17 AM

## 2024-05-25 ENCOUNTER — Ambulatory Visit: Payer: Self-pay | Admitting: Family Medicine

## 2024-05-26 ENCOUNTER — Encounter: Payer: Self-pay | Admitting: Internal Medicine

## 2024-05-26 ENCOUNTER — Ambulatory Visit: Attending: Internal Medicine | Admitting: Internal Medicine

## 2024-05-26 ENCOUNTER — Ambulatory Visit (HOSPITAL_COMMUNITY)
Admission: RE | Admit: 2024-05-26 | Discharge: 2024-05-26 | Disposition: A | Source: Ambulatory Visit | Attending: Internal Medicine | Admitting: Internal Medicine

## 2024-05-26 VITALS — BP 79/40 | HR 68 | Ht 74.0 in | Wt 187.0 lb

## 2024-05-26 DIAGNOSIS — R059 Cough, unspecified: Secondary | ICD-10-CM | POA: Insufficient documentation

## 2024-05-26 DIAGNOSIS — Z79899 Other long term (current) drug therapy: Secondary | ICD-10-CM

## 2024-05-26 DIAGNOSIS — I959 Hypotension, unspecified: Secondary | ICD-10-CM

## 2024-05-26 NOTE — Patient Instructions (Signed)
 Medication Instructions:  Your physician has recommended you make the following change in your medication:   ** Stop Isosorbide  HCL - Bidil   ** Stop Entresto   *If you need a refill on your cardiac medications before your next appointment, please call your pharmacy*  Lab Work: CBC, BMET, NMR and Viamin D If you have labs (blood work) drawn today and your tests are completely normal, you will receive your results only by: MyChart Message (if you have MyChart) OR A paper copy in the mail If you have any lab test that is abnormal or we need to change your treatment, we will call you to review the results.  Testing/Procedures: A chest x-ray takes a picture of the organs and structures inside the chest, including the heart, lungs, and blood vessels. This test can show several things, including, whether the heart is enlarges; whether fluid is building up in the lungs; and whether pacemaker / defibrillator leads are still in place.    Follow-Up: At [facility], you and your health needs are our priority.  As part of our continuing mission to provide you with exceptional heart care, our providers are all part of one team.  This team includes your primary Cardiologist (physician) and Advanced Practice Providers or APPs (Physician Assistants and Nurse Practitioners) who all work together to provide you with the care you need, when you need it.  Your next appointment:   Dr Okey will call you tomorrow

## 2024-05-27 ENCOUNTER — Ambulatory Visit: Attending: *Deleted | Admitting: *Deleted

## 2024-05-27 ENCOUNTER — Encounter: Payer: Self-pay | Admitting: Hematology

## 2024-05-27 ENCOUNTER — Encounter: Payer: Self-pay | Admitting: *Deleted

## 2024-05-27 ENCOUNTER — Other Ambulatory Visit: Payer: Self-pay

## 2024-05-27 VITALS — BP 105/65 | HR 68 | Ht 74.0 in | Wt 192.0 lb

## 2024-05-27 DIAGNOSIS — E7800 Pure hypercholesterolemia, unspecified: Secondary | ICD-10-CM

## 2024-05-27 DIAGNOSIS — R351 Nocturia: Secondary | ICD-10-CM

## 2024-05-27 DIAGNOSIS — I5042 Chronic combined systolic (congestive) and diastolic (congestive) heart failure: Secondary | ICD-10-CM

## 2024-05-27 DIAGNOSIS — I110 Hypertensive heart disease with heart failure: Secondary | ICD-10-CM

## 2024-05-27 LAB — NMR, LIPOPROFILE
Cholesterol, Total: 125 mg/dL (ref 100–199)
HDL Particle Number: 27.2 umol/L — ABNORMAL LOW (ref >=30.5)
HDL-C: 47 mg/dL (ref >39)
LDL Particle Number: 812 nmol/L (ref <1000)
LDL Size: 20.7 nm (ref >20.5)
LDL-C (NIH Calc): 65 mg/dL (ref 0–99)
LP-IR Score: 42 (ref <=45)
Small LDL Particle Number: 264 nmol/L (ref <=527)
Triglycerides: 58 mg/dL (ref 0–149)

## 2024-05-27 LAB — BASIC METABOLIC PANEL WITH GFR
BUN/Creatinine Ratio: 25 — ABNORMAL HIGH (ref 10–24)
BUN: 24 mg/dL (ref 8–27)
CO2: 29 mmol/L (ref 20–29)
Calcium: 9.4 mg/dL (ref 8.6–10.2)
Chloride: 101 mmol/L (ref 96–106)
Creatinine, Ser: 0.95 mg/dL (ref 0.76–1.27)
Glucose: 95 mg/dL (ref 70–99)
Potassium: 4.8 mmol/L (ref 3.5–5.2)
Sodium: 144 mmol/L (ref 134–144)
eGFR: 86 mL/min/1.73 (ref >59)

## 2024-05-27 LAB — CBC
Hematocrit: 38.3 % (ref 37.5–51.0)
Hemoglobin: 11.8 g/dL — ABNORMAL LOW (ref 13.0–17.7)
MCH: 28.4 pg (ref 26.6–33.0)
MCHC: 30.8 g/dL — ABNORMAL LOW (ref 31.5–35.7)
MCV: 92 fL (ref 79–97)
Platelets: 197 x10E3/uL (ref 150–450)
RBC: 4.15 x10E6/uL (ref 4.14–5.80)
RDW: 14.6 % (ref 11.6–15.4)
WBC: 6.1 x10E3/uL (ref 3.4–10.8)

## 2024-05-27 MED ORDER — ATORVASTATIN CALCIUM 80 MG PO TABS
80.0000 mg | ORAL_TABLET | Freq: Every day | ORAL | 1 refills | Status: AC
  Filled 2024-05-27 – 2024-06-15 (×2): qty 90, 90d supply, fill #0

## 2024-05-27 NOTE — Patient Instructions (Signed)
 We discussed your medications and associated diagnoses. From your report you feel that you are stable with your multiple, complex diagnoses. Congratulations on decreasing your cigarette intake to 3 a day For history of nocturia we will check a PSA. For history of high cholesterol we did check your liver functions and did refill your atorvastatin . Continue to see cardiology and pulmonology

## 2024-05-27 NOTE — Progress Notes (Addendum)
 Patient ID: Jared [name], male    DOB: Oct 31, 1955  MRN: [id number]  CC: Hypertension (HTN f/u. Med refills. /No questions / concerns)   Subjective: Jared [name] is a 68 y.o. male who presents for chronic ds management. His concerns today include: Anemia, history of AKI, history of A-fib with RVR, CAD, CHF, COPD, history of cluster headaches, hypertension, history of tobacco use, nonischemic cardiomyopathy  Hypertension follow-up.  Request for med refills.  His is on bisoprolol  and Lasix .  He has refills of these meds.  He does need a refill of Atorvastatin   He denies symptoms of concern such as chest pain, palpitations, shortness of breath or symptom into an extremity that would suggest a stroke. He attempts to eat heart healthy diet. He reports that he cannot exercise as he used to because of his history of COPD on 4 L of oxygen . He has his rollator today and moves about the exam room without difficulty  Nocturia: Reports getting up 2-3 times a night to void.  No UTI symptoms.  No S/S of UTI.  Relates nocturia to drinking fluids later in the evening.  Denies straining or dribbling with urination  Patient Active Problem List   Diagnosis Date Noted   Family history of colon cancer in mother 03/23/2024   Benign neoplasm of cecum 03/23/2024   Benign neoplasm of ascending colon 03/23/2024   Hypercarbia 02/10/2024   SVT (supraventricular tachycardia) 02/10/2024   COPD with acute exacerbation (HCC) 02/09/2024   AVM (arteriovenous malformation) of small bowel, acquired 11/19/2023   Chronic health problem 08/23/2023   Anemia 08/22/2023   Acute on chronic anemia 08/22/2023   Thrombocytosis 08/22/2023   Left leg pain 08/22/2023   Heme positive stool 05/19/2023   NSVT (nonsustained ventricular tachycardia) (HCC) 05/19/2023   Melena 05/18/2023   Chronic anticoagulation 05/18/2023   Heart failure with improved ejection fraction (HFimpEF) (HCC) 05/12/2023   HFrEF (heart failure with reduced  ejection fraction) (HCC) 05/06/2023   Malnutrition of moderate degree 05/01/2023   Sepsis (HCC) 04/30/2023   History of CVA (cerebrovascular accident) 04/30/2023   PAF (paroxysmal atrial fibrillation) (HCC) 12/27/2022   Atherosclerosis of aorta 10/25/2022   Peripheral vascular disease 10/25/2022   Weakness 03/21/2022   Syncope and collapse 02/07/2021   History of GI bleed    Gastric ulcer due to Helicobacter pylori and nonsteroidal anti-inflammatory drug (NSAID)    Epigastric pain    Chronic right maxillary sinusitis 01/29/2019   Chronic respiratory failure with hypoxia and hypercapnia (HCC) 01/01/2019   Upper GI bleed 10/27/2018   Anticoagulated 09/27/2018   Angiectasia of gastrointestinal tract    Iron  deficiency anemia due to chronic blood loss    Symptomatic anemia 09/04/2018   Tobacco use 09/04/2018   Noncompliance 06/14/2017   Elevated LFTs 08/13/2016   Chronic respiratory failure with hypoxia (HCC) 07/24/2016   Atrial flutter (HCC)    Hepatic congestion 07/02/2016   Type 2 diabetes mellitus (HCC) 05/14/2016   COPD GOLD III/still smoking  02/29/2016   Cigarette smoker 01/09/2016   CAD (coronary artery disease) 10/07/2013   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   Centrilobular emphysema (HCC) 10/04/2013   Essential hypertension      Medications Ordered Prior to Encounter[1]  Allergies[2]  Social History   Socioeconomic History   Marital status: Single    Spouse name: Not on file   Number of children: 1   Years of education: Not on file   Highest education level: 12th grade  Occupational History  Occupation: retired  Tobacco Use   Smoking status: Every Day    Current packs/day: 0.50    Average packs/day: 0.5 packs/day for 47.0 years (23.5 ttl pk-yrs)    Types: Cigarettes   Smokeless tobacco: Never   Tobacco comments:    4/5 cigs  Vaping Use   Vaping status: Never Used  Substance and Sexual Activity   Alcohol use: Not Currently     Alcohol/week: 0.0 standard drinks of alcohol    Comment: last drink was before xmas   Drug use: No    Types: Cocaine, Marijuana    Comment: nothing in 20 years   Sexual activity: Not Currently  Other Topics Concern   Not on file  Social History Narrative   unemployed   Social Drivers of Health   Tobacco Use: High Risk (05/26/2024)   Patient History    Smoking Tobacco Use: Every Day    Smokeless Tobacco Use: Never    Passive Exposure: Not on file  Financial Resource Strain: Medium Risk (04/26/2024)   Overall Financial Resource Strain (CARDIA)    Difficulty of Paying Living Expenses: Somewhat hard  Food Insecurity: No Food Insecurity (05/01/2024)   Epic    Worried About Programme Researcher, Broadcasting/film/video in the Last Year: Never true    Ran Out of Food in the Last Year: Never true  Recent Concern: Food Insecurity - Food Insecurity Present (04/26/2024)   Epic    Worried About Programme Researcher, Broadcasting/film/video in the Last Year: Sometimes true    Ran Out of Food in the Last Year: Never true  Transportation Needs: No Transportation Needs (05/01/2024)   Epic    Lack of Transportation (Medical): No    Lack of Transportation (Non-Medical): No  Physical Activity: Insufficiently Active (04/26/2024)   Exercise Vital Sign    Days of Exercise per Week: 2 days    Minutes of Exercise per Session: 10 min  Stress: No Stress Concern Present (04/26/2024)   [employer] of Occupational Health - Occupational Stress Questionnaire    Feeling of Stress: Not at all  Social Connections: Socially Isolated (04/26/2024)   Social Connection and Isolation Panel    Frequency of Communication with Friends and Family: More than three times a week    Frequency of Social Gatherings with Friends and Family: Patient declined    Attends Religious Services: Never    Database Administrator or Organizations: No    Attends Engineer, Structural: Not on file    Marital Status: Divorced  Intimate Partner Violence: Not At Risk  (05/01/2024)   Epic    Fear of Current or Ex-Partner: No    Emotionally Abused: No    Physically Abused: No    Sexually Abused: No  Depression (PHQ2-9): Low Risk (03/19/2024)   Depression (PHQ2-9)    PHQ-2 Score: 0  Alcohol Screen: Low Risk (04/26/2024)   Alcohol Screen    Last Alcohol Screening Score (AUDIT): 1  Housing: Low Risk (05/01/2024)   Epic    Unable to Pay for Housing in the Last Year: No    Number of Times Moved in the Last Year: 0    Homeless in the Last Year: No  Utilities: Not At Risk (05/01/2024)   Epic    Threatened with loss of utilities: No  Health Literacy: Adequate Health Literacy (06/10/2023)   B1300 Health Literacy    Frequency of need for help with medical instructions: Never    Family History  Problem Relation Age of  Onset   Heart disease Mother    Diabetes Mother    Colon cancer Mother    Liver cancer Mother    Cancer Father        type unknown   Diabetes Sister        x 2   Diabetes Brother     Past Surgical History:  Procedure Laterality Date   ABDOMINAL AORTOGRAM W/LOWER EXTREMITY N/A 12/07/2022   Procedure: ABDOMINAL AORTOGRAM W/LOWER EXTREMITY;  Surgeon: Magda Debby SAILOR, MD;  [facility]: [facility];  Service: Cardiovascular;  Laterality: N/A;   ABDOMINAL AORTOGRAM W/LOWER EXTREMITY Left 08/02/2023   Procedure: ABDOMINAL AORTOGRAM W/LOWER EXTREMITY;  Surgeon: Magda Debby SAILOR, MD;  Location: [facility];  Service: Cardiovascular;  Laterality: Left;   BALLOON ENTEROSCOPY N/A 11/19/2023   Procedure: ENTEROSCOPY, USING BALLOON;  Surgeon: San Sandor GAILS, DO;  Location: [facility];  Service: Gastroenterology;  Laterality: N/A;  single balloon enteroscopy   BIOPSY  11/12/2020   Procedure: BIOPSY;  Surgeon: Wilhelmenia Aloha Raddle., MD;  Location: [facility];  Service: Gastroenterology;;   CARDIAC CATHETERIZATION  10/2010   LM normal, LAD with 20% irregularities, LCX with 20%, RCA with 40% prox and 40% distal - EF of 35%    CATARACT EXTRACTION, BILATERAL     COLONOSCOPY N/A 03/23/2024   Procedure: COLONOSCOPY;  Surgeon: Legrand Victory LITTIE DOUGLAS, MD;  Location: [facility];  Service: Gastroenterology;  Laterality: N/A;   COLONOSCOPY W/ BIOPSIES AND POLYPECTOMY     COLONOSCOPY WITH PROPOFOL  N/A 09/06/2018   Procedure: COLONOSCOPY WITH PROPOFOL ;  Surgeon: Eda Iha, MD;  Location: [facility] ENDOSCOPY;  Service: Gastroenterology;  Laterality: N/A;   ENTEROSCOPY N/A 09/28/2018   Procedure: ENTEROSCOPY;  Surgeon: Rollin Dover, MD;  Location: [facility] ENDOSCOPY;  Service: Endoscopy;  Laterality: N/A;   ENTEROSCOPY N/A 10/28/2018   Procedure: ENTEROSCOPY;  Surgeon: Eda Iha, MD;  Location[facility]COPY;  Service: Gastroenterology;  Laterality: N/A;   ENTEROSCOPY N/A 10/09/2020   Procedure: ENTEROSCOPY;  Surgeon: Abran Norleen SAILOR, MD;  Location: [facility] ENDOSCOPY;  Service: Endoscopy;  Laterality: N/A;   ENTEROSCOPY N/A 03/22/2022   Procedure: ENTEROSCOPY;  Surgeon: Leigh Elspeth SQUIBB, MD;  Location: [facility] ENDOSCOPY;  Service: Gastroenterology;  Laterality: N/A;   ENTEROSCOPY N/A 05/20/2023   Procedure: ENTEROSCOPY;  Surgeon: Legrand Victory LITTIE DOUGLAS, MD;  Location: [facility] ENDOSCOPY;  Service: Gastroenterology;  Laterality: N/A;   ENTEROSCOPY N/A 08/24/2023   Procedure: ENTEROSCOPY;  Surgeon: Suzann [name] HERO, MD;  Location: [facility] ENDOSCOPY;  Service: Gastroenterology;  Laterality: N/A;   ESOPHAGOGASTRODUODENOSCOPY N/A 11/12/2020   Procedure: ESOPHAGOGASTRODUODENOSCOPY (EGD);  Surgeon: Wilhelmenia Aloha Raddle., MD;  Location: [facility];  Service: Gastroenterology;  Laterality: N/A;   ESOPHAGOGASTRODUODENOSCOPY (EGD) WITH PROPOFOL  N/A 09/05/2018   Procedure: ESOPHAGOGASTRODUODENOSCOPY (EGD) WITH PROPOFOL ;  Surgeon: Leigh Elspeth SQUIBB, MD;  Location: [facility] ENDOSCOPY;  Service: Gastroenterology;  Laterality: N/A;   ESOPHAGOGASTRODUODENOSCOPY (EGD) WITH PROPOFOL  N/A 11/19/2020   Procedure: ESOPHAGOGASTRODUODENOSCOPY (EGD) WITH PROPOFOL ;  Surgeon: Albertus Gordy HERO, MD;  Location: [facility];  Service: Gastroenterology;  Laterality: N/A;   ESOPHAGOGASTRODUODENOSCOPY (EGD) WITH PROPOFOL  N/A 12/27/2022   Procedure: ESOPHAGOGASTRODUODENOSCOPY (EGD) WITH PROPOFOL ;  Surgeon: Shila Gustav GAILS, MD;  Location: [facility];  Service: Gastroenterology;  Laterality: N/A;   EXCISION MASS HEAD     GIVENS CAPSULE STUDY N/A 09/06/2018   Procedure: GIVENS CAPSULE STUDY;  Surgeon: Eda Iha, MD;  Location: [facility] ENDOSCOPY;  Service: Gastroenterology;  Laterality: N/A;   GIVENS CAPSULE STUDY N/A 09/26/2018   Procedure: GIVENS CAPSULE  STUDY;  Surgeon: Albertus Gordy HERO, MD;  Location: [facility] ENDOSCOPY;  Service: Gastroenterology;  Laterality: N/A;   GIVENS CAPSULE STUDY N/A 06/14/2019   Procedure: GIVENS CAPSULE STUDY;  Surgeon: Shila Gustav GAILS, MD;  Location: [facility];  Service: Endoscopy;  Laterality: N/A;   HEMOSTASIS CLIP PLACEMENT  11/12/2020   Procedure: HEMOSTASIS CLIP PLACEMENT;  Surgeon: Wilhelmenia Aloha Raddle., MD;  Location: [facility];  Service: Gastroenterology;;   HEMOSTASIS CONTROL  11/12/2020   Procedure: HEMOSTASIS CONTROL;  Surgeon: Wilhelmenia Aloha Raddle., MD;  Location: [facility];  Service: Gastroenterology;;   HOT HEMOSTASIS N/A 10/28/2018   Procedure: HOT HEMOSTASIS (ARGON PLASMA COAGULATION/BICAP);  Surgeon: Eda Iha, MD;  Location: [facility] ENDOSCOPY;  Service: Gastroenterology;  Laterality: N/A;   HOT HEMOSTASIS N/A 11/12/2020   Procedure: HOT HEMOSTASIS (ARGON PLASMA COAGULATION/BICAP);  Surgeon: Wilhelmenia Aloha Raddle., MD;  Location: [facility];  Service: Gastroenterology;  Laterality: N/A;   HOT HEMOSTASIS N/A 03/22/2022   Procedure: HOT HEMOSTASIS (ARGON PLASMA COAGULATION/BICAP);  Surgeon: Leigh Elspeth SQUIBB, MD;  Location: [facility] ENDOSCOPY;  Service: Gastroenterology;  Laterality: N/A;   HOT HEMOSTASIS N/A 05/20/2023   Procedure: HOT HEMOSTASIS (ARGON PLASMA COAGULATION/BICAP);  Surgeon: Legrand Victory LITTIE DOUGLAS, MD;  Location: [facility]  ENDOSCOPY;  Service: Gastroenterology;  Laterality: N/A;   HOT HEMOSTASIS N/A 08/24/2023   Procedure: EGD, WITH ARGON PLASMA COAGULATION;  Surgeon: Suzann [name] HERO, MD;  Location: [facility] ENDOSCOPY;  Service: Gastroenterology;  Laterality: N/A;   INCISION AND DRAINAGE PERIRECTAL ABSCESS N/A 06/05/2017   Procedure: IRRIGATION AND DEBRIDEMENT PERIRECTAL ABSCESS;  Surgeon: Teresa Lonni HERO, MD;  Location: [facility];  Service: General;  Laterality: N/A;   IR CATHETER TUBE CHANGE  05/14/2023   LAPAROSCOPIC APPENDECTOMY N/A 05/01/2023   Procedure: APPENDECTOMY LAPAROSCOPIC;  Surgeon: Ebbie Cough, MD;  Location: [facility] OR;  Service: General;  Laterality: N/A;   PERIPHERAL VASCULAR BALLOON ANGIOPLASTY Left 08/02/2023   Procedure: PERIPHERAL VASCULAR BALLOON ANGIOPLASTY;  Surgeon: Magda Debby SAILOR, MD;  [facility];  Service: Cardiovascular;  Laterality: Left;   PERIPHERAL VASCULAR INTERVENTION  12/07/2022   Procedure: PERIPHERAL VASCULAR INTERVENTION;  Surgeon: Magda Debby SAILOR, MD;  [facility];  Service: Cardiovascular;;   PERIPHERAL VASCULAR INTERVENTION Left 08/02/2023   Procedure: PERIPHERAL VASCULAR INTERVENTION;  Surgeon: Magda Debby SAILOR, MD;  [facility];  Service: Cardiovascular;  Laterality: Left;   SUBMUCOSAL TATTOO INJECTION  11/12/2020   Procedure: SUBMUCOSAL TATTOO INJECTION;  Surgeon: Wilhelmenia Aloha Raddle., MD;  Location: [facility];  Service: Gastroenterology;;   SUBMUCOSAL TATTOO INJECTION  05/20/2023   Procedure: SUBMUCOSAL TATTOO INJECTION;  Surgeon: Legrand Victory LITTIE DOUGLAS, MD;  Location: [facility] ENDOSCOPY;  Service: Gastroenterology;;   VIDEO BRONCHOSCOPY Bilateral 05/08/2016   Procedure: VIDEO BRONCHOSCOPY WITH FLUORO;  Surgeon: Harden GAILS Staff, MD;  Location: [facility] ENDOSCOPY;  Service: Cardiopulmonary;  Laterality: Bilateral;    ROS: Review of Systems Negative except as stated above  PHYSICAL EXAM: BP 105/65 (BP Location: Left Arm, Patient  Position: Sitting, Cuff Size: Normal)   Pulse 68   Ht 6' 2 (1.88 m)   Wt 192 lb (87.1 kg)   SpO2 97%   BMI 24.65 kg/m   Physical Exam Vitals and nursing note reviewed.  Constitutional:      Appearance: Normal appearance.  HENT:     Mouth/Throat:     Mouth: Mucous membranes are moist.     Pharynx: Oropharynx is clear.  Eyes:     Conjunctiva/sclera: Conjunctivae normal.  Cardiovascular:  Rate and Rhythm: Normal rate and regular rhythm.  Pulmonary:     Effort: Pulmonary effort is normal.     Breath sounds: Normal breath sounds.     Comments: On 4 L of oxygen  per nasal cannula Skin:    General: Skin is warm and dry.  Neurological:     Mental Status: He is alert and oriented to person, place, and time. Mental status is at baseline.     Gait: Gait normal.     Comments: Ambulates with a rollator          Latest Ref Rng & Units 05/26/2024   10:56 AM 03/11/2024    1:19 PM 03/11/2024    1:06 PM  CMP  Glucose 70 - 99 mg/dL 95  893  891   BUN 8 - 27 mg/dL 24  9  8    Creatinine 0.76 - 1.27 mg/dL 9.04  9.09  9.17   Sodium 134 - 144 mmol/L 144  144  142   Potassium 3.5 - 5.2 mmol/L 4.8  3.7  3.5   Chloride 96 - 106 mmol/L 101  96  97   CO2 20 - 29 mmol/L 29   35   Calcium  8.6 - 10.2 mg/dL 9.4   9.1    Lipid Panel     Component Value Date/Time   CHOL 127 10/25/2022 1027   TRIG 80 10/25/2022 1027   HDL 49 10/25/2022 1027   CHOLHDL 2.9 01/29/2020 1020   CHOLHDL 2.8 01/11/2016 0446   VLDL 14 01/11/2016 0446   LDLCALC 62 10/25/2022 1027    CBC    Component Value Date/Time   WBC 6.1 05/26/2024 1056   WBC 8.8 05/13/2024 0917   WBC 5.4 03/11/2024 1306   RBC 4.15 05/26/2024 1056   RBC 4.46 05/13/2024 0917   HGB 11.8 (L) 05/26/2024 1056   HCT 38.3 05/26/2024 1056   PLT 197 05/26/2024 1056   MCV 92 05/26/2024 1056   MCH 28.4 05/26/2024 1056   MCH 27.6 05/13/2024 0917   MCHC 30.8 (L) 05/26/2024 1056   MCHC 30.8 05/13/2024 0917   RDW 14.6 05/26/2024 1056    LYMPHSABS 1.7 05/13/2024 0917   LYMPHSABS 2.7 06/10/2023 1012   MONOABS 0.6 05/13/2024 0917   EOSABS 0.1 05/13/2024 0917   EOSABS 0.1 06/10/2023 1012   BASOSABS 0.0 05/13/2024 0917   BASOSABS 0.1 06/10/2023 1012    Results for orders placed or performed in visit on 05/26/24  NMR, lipoprofile   Collection Time: 05/26/24 10:56 AM  Result Value Ref Range   LDL Particle Number WILL FOLLOW    LDL-C (NIH Calc) WILL FOLLOW    HDL-C WILL FOLLOW    Triglycerides WILL FOLLOW    Cholesterol, Total WILL FOLLOW    HDL Particle Number WILL FOLLOW    Small LDL Particle Number WILL FOLLOW    LDL Size WILL FOLLOW    LP-IR Score WILL FOLLOW   CBC   Collection Time: 05/26/24 10:56 AM  Result Value Ref Range   WBC 6.1 3.4 - 10.8 x10E3/uL   RBC 4.15 4.14 - 5.80 x10E6/uL   Hemoglobin 11.8 (L) 13.0 - 17.7 g/dL   Hematocrit 61.6 62.4 - 51.0 %   MCV 92 79 - 97 fL   MCH 28.4 26.6 - 33.0 pg   MCHC 30.8 (L) 31.5 - 35.7 g/dL   RDW 85.3 88.3 - 84.5 %   Platelets 197 150 - 450 x10E3/uL  Basic metabolic panel with GFR   Collection  Time: 05/26/24 10:56 AM  Result Value Ref Range   Glucose 95 70 - 99 mg/dL   BUN 24 8 - 27 mg/dL   Creatinine, Ser 9.04 0.76 - 1.27 mg/dL   eGFR 86 >40 fO/fpw/8.26   BUN/Creatinine Ratio 25 (H) 10 - 24   Sodium 144 134 - 144 mmol/L   Potassium 4.8 3.5 - 5.2 mmol/L   Chloride 101 96 - 106 mmol/L   CO2 29 20 - 29 mmol/L   Calcium  9.4 8.6 - 10.2 mg/dL   *Note: Due to a large number of results and/or encounters for the requested time period, some results have not been displayed. A complete set of results can be found in Results Review.     ASSESSMENT AND PLAN:  Assessment & Plan Hypertensive heart disease with chronic combined systolic and diastolic congestive heart failure (HCC) Blood pressure stable today. No cardiac or pulmonary complaints He does see a pulmonologist and a cardiologist routinely Okay to refill atorvastatin .  Check lipid panel.  No other refills due  today Orders:   atorvastatin  (LIPITOR ) 80 MG tablet; Take 1 tablet (80 mg total) by mouth daily.   Hepatic Function Panel  Nocturia Reports nocturia without any issues starting or stopping urine flow Okay for PSA Orders:   PSA, Medicare ([facility])  Hypercholesterolemia History of elevated cholesterol on atorvastatin .  Attempts to adhere to a heart healthy diet.  Exercise is restricted due to pulmonary disease and use of rollator Will check a lipid panel.  Continue atorvastatin .  Will be seen in 3 months Orders:   Lipid Panel       Patient was given the opportunity to ask questions.  Patient verbalized understanding of the plan and was able to repeat key elements of the plan.   This documentation was completed using Paediatric nurse.  Any transcriptional errors are unintentional.     Requested Prescriptions   Signed Prescriptions Disp Refills   atorvastatin  (LIPITOR ) 80 MG tablet 90 tablet 1    Sig: Take 1 tablet (80 mg total) by mouth daily.    No follow-ups on file.  Benisha Hadaway H, NP  Addendum added 05/27/2024.  Provided more detailed HPI for nocturia and added list of blood pressure medications    [1]  Current Outpatient Medications on File Prior to Visit  Medication Sig Dispense Refill   albuterol  (PROVENTIL ) (2.5 MG/3ML) 0.083% nebulizer solution Take 3 mLs (2.5 mg total) by nebulization every 2 (two) hours as needed for wheezing or shortness of breath. 75 mL 1   albuterol  (VENTOLIN  HFA) 108 (90 Base) MCG/ACT inhaler INHALE 2 PUFFS INTO THE LUNGS EVERY 6 (SIX) HOURS AS NEEDED FOR WHEEZING OR SHORTNESS OF BREATH. 18 g 6   aspirin  EC 81 MG tablet Take 81 mg by mouth daily. Swallow whole.     bisoprolol  (ZEBETA ) 5 MG tablet Take 1 tablet (5 mg total) by mouth daily. 90 tablet 1   budesonide  (PULMICORT ) 0.5 MG/2ML nebulizer solution Take 2 mLs (0.5 mg total) by nebulization 2 (two) times daily. 120 mL 0   budesonide -glycopyrrolate -formoterol   (BREZTRI  AEROSPHERE) 160-9-4.8 MCG/ACT AERO inhaler Take 2 puffs first thing in morning and then another 2 puffs about 12 hours later. 10.7 g 11   buPROPion  (WELLBUTRIN  SR) 150 MG 12 hr tablet Take 1 tablet (150 mg total) by mouth daily for 3 days, THEN 1 tablet (150 mg total) 2 (two) times daily thereafter for smoking cessation. 60 tablet 3   cetirizine  (ZYRTEC ) 10 MG tablet Take  1 tablet (10 mg total) by mouth daily. 30 tablet 1   DULoxetine  (CYMBALTA ) 60 MG capsule Take 1 capsule (60 mg total) by mouth daily. For chronic leg pains 90 capsule 1   ergocalciferol  (DRISDOL ) 1.25 MG (50000 UT) capsule Take 1 capsule (50,000 Units total) by mouth once a week. 12 capsule 1   ezetimibe  (ZETIA ) 10 MG tablet Take 1 tablet (10 mg total) by mouth daily. 90 tablet 3   ferrous sulfate  (FEROSUL) 325 (65 FE) MG tablet Take 1 tablet (325 mg total) by mouth 2 (two) times daily with a meal. 180 tablet 1   fluticasone  (FLONASE ) 50 MCG/ACT nasal spray Place 2 sprays into both nostrils daily. 16 g 6   folic acid  (FOLVITE ) 1 MG tablet Take 1 tablet (1 mg total) by mouth daily. 90 tablet 1   furosemide  (LASIX ) 40 MG tablet Take 1 tablet (40 mg total) by mouth 2 (two) times daily. 180 tablet 1   nitroGLYCERIN  (NITROSTAT ) 0.4 MG SL tablet Place 0.4 mg under the tongue every 5 (five) minutes as needed for chest pain.     OXYGEN  Inhale 2 L/min into the lungs continuous. (Patient taking differently: Inhale 4 L/min into the lungs continuous.)     pantoprazole  (PROTONIX ) 40 MG tablet Take 1 tablet (40 mg total) by mouth daily. 90 tablet 1   rivaroxaban  (XARELTO ) 20 MG TABS tablet Take 1 tablet (20 mg total) by mouth daily with supper. 90 tablet 1   topiramate  (TOPAMAX ) 25 MG tablet Take 1 tablet by mouth daily at night for 1 week  then increase to 1 tablet twice daily 60 tablet 1   butalbital -acetaminophen -caffeine  (FIORICET ) 50-325-40 MG tablet Take 1 tablet by mouth every 12 (twelve) hours as needed for headache. (Patient not  taking: Reported on 05/27/2024) 30 tablet 1   gabapentin  (NEURONTIN ) 300 MG capsule Take 1 capsule (300 mg total) by mouth at bedtime. For leg pains (Patient not taking: Reported on 05/27/2024) 90 capsule 1   ipratropium-albuterol  (DUONEB) 0.5-2.5 (3) MG/3ML SOLN Take 3 mLs by nebulization every 6 (six) hours. (Patient not taking: Reported on 05/27/2024) 360 mL 0   [DISCONTINUED] TUDORZA PRESSAIR  400 MCG/ACT AEPB INHALE 1 PUFF INTO THE LUNGS IN THE MORNING AND AT BEDTIME. 1 each 11   No current facility-administered medications on file prior to visit.  [2]  Allergies Allergen Reactions   Lisinopril  Cough

## 2024-05-28 ENCOUNTER — Ambulatory Visit: Payer: Self-pay | Admitting: Internal Medicine

## 2024-05-28 LAB — LIPID PANEL
Chol/HDL Ratio: 2.4 ratio (ref 0.0–5.0)
Cholesterol, Total: 114 mg/dL (ref 100–199)
HDL: 48 mg/dL (ref >39)
LDL Chol Calc (NIH): 52 mg/dL (ref 0–99)
Triglycerides: 62 mg/dL (ref 0–149)
VLDL Cholesterol Cal: 14 mg/dL (ref 5–40)

## 2024-05-28 LAB — HEPATIC FUNCTION PANEL
ALT: 33 IU/L (ref 0–44)
AST: 24 IU/L (ref 0–40)
Albumin: 4 g/dL (ref 3.9–4.9)
Alkaline Phosphatase: 69 IU/L (ref 47–123)
Bilirubin Total: 0.2 mg/dL (ref 0.0–1.2)
Bilirubin, Direct: <0.08 mg/dL (ref 0.00–0.40)
Total Protein: 6.3 g/dL (ref 6.0–8.5)

## 2024-05-28 LAB — PSA: Prostate Specific Ag, Serum: 0.8 ng/mL (ref 0.0–4.0)

## 2024-05-29 ENCOUNTER — Other Ambulatory Visit: Payer: Self-pay | Admitting: *Deleted

## 2024-05-29 ENCOUNTER — Telehealth: Payer: Self-pay | Admitting: Internal Medicine

## 2024-05-29 NOTE — Patient Outreach (Signed)
 Complex Care Management   Visit Note  05/29/2024  Name:  Jared [name] MRN: [id number] DOB: 09/08/1955  Situation: Referral received for Complex Care Management related to COPD I obtained verbal consent from Patient.  Visit completed with Patient  on the phone  Background:   Past Medical History:  Diagnosis Date   Acute blood loss anemia 05/18/2023   Acute perforated appendicitis 04/30/2023   AKI (acute kidney injury) 11/18/2020   Atrial fibrillation with RVR (HCC) 05/06/2023   AVM (arteriovenous malformation)    CAD (coronary artery disease)    a. LHC 5/12:  LAD 20, pLCx 20, pRCA 40, dRCA 40, EF 35%, diff HK  //  b. Myoview 4/16: Overall Impression:  High risk stress nuclear study There is no evidence of ischemia.  There is severe LV dysfunction. LV Ejection Fraction: 30%.  LV Wall Motion:  There is global LV hypokinesis.     CAP (community acquired pneumonia) 09/2013   Chronic combined systolic and diastolic CHF (congestive heart failure) (HCC)    a. Echo 4/16:Mild LVH, EF 40-45%, diffuse HK //  b. Echo 8/17: EF 35-40%, diffuse HK, diastolic dysfunction, aortic sclerosis, trivial MR, moderate LAE, normal RVSF, moderate RAE, mild TR, PASP 42 mmHg // c. Echo 4/18: Mild concentric LVH, EF 30-35, normal wall motion, grade 1 diastolic dysfunction, PASP 49   Chronic respiratory failure (HCC)    Cluster headache    hx; haven't had one in awhile (01/09/2016)   COPD (chronic obstructive pulmonary disease) (HCC)    thelbert 01/09/2016   DM (diabetes mellitus) (HCC)    History of CVA (cerebrovascular accident)    Hypertension    IDA (iron  deficiency anemia)    Moderate tobacco use disorder    NICM (nonischemic cardiomyopathy) (HCC)    Nicotine  addiction    Peripheral vascular disease    Syncope 06/11/2019   Tobacco abuse    Type 2 diabetes mellitus (HCC) 05/14/2016    Assessment: Pt indicated off and on shakes for a long time and his provider is aware. RNCM encouraged pt to seek possible  neurologist consult if consistent with his symptoms and mention to Dr. Darlean on his upcoming appointment on 12/22 Patient Reported Symptoms:  Cognitive Cognitive Status: Able to follow simple commands, Alert and oriented to person, place, and time, Normal speech and language skills   Health Maintenance Behaviors: Annual physical exam Healing Pattern: Fast Health Facilitated by: Rest  Neurological Neurological Review of Symptoms: No symptoms reported Neurological Management Strategies: Coping strategies, Routine screening, Adequate rest Neurological Comment: Previous headaches resolved.  HEENT HEENT Symptoms Reported: Other: (Pt reports stuffy nose.States it is worse around smokers and fragranced perfumes.) HEENT Management Strategies: Coping strategies, Adequate rest, Routine screening HEENT Self-Management Outcome: 4 (good) HEENT Comment: Continue to have nasal dryness with occassional nose-bleeds due to use of oxygen -uses nasal spray to help mositure to the passages. Discussed streaming showers breathing in the mist to assist with mositures to help with the stuffiness also using a humidifier.    Cardiovascular Cardiovascular Symptoms Reported: No symptoms reported Does patient have uncontrolled Hypertension?: Yes (Historically HTN diagnosis) Is patient checking Blood Pressure at home?: Yes Patient's Recent BP reading at home: Officevisit on 05/27/2024 bp 105/65 and pt remains asymptomatic. Cardiovascular Management Strategies: Coping strategies, Routine screening, Medication therapy Weight: 192 lb (87.1 kg) (Reports last read at the provider's office) Cardiovascular Self-Management Outcome: 4 (good) Cardiovascular Comment: Pt received report from Dr. Okey indicating recent Echocardiogram was fine.  Respiratory Respiratory Symptoms Reported: Wheezing,  Other: Other Respiratory Symptoms: Pending pulmonlogist Dr. Darlean 06/01/2024 and pt continue to take his medications. Encouraged pt to  discussed his current symptoms of 'stuffiness with this provider. Confirmed pt remains in the GREEN zone with no acute symptoms to address at this tiime. Additional Respiratory Details: Pt continue to smoke however reduced down to 3-4 cig daily and continue to take his Wellbutrin  medication. Respiratory Management Strategies: BiPAP, Medication therapy, Coping strategies, Adequate rest, Oxygen  therapy, Routine screening (Discussed humifider with stream to assist with his ongoing symptoms. Confirmed he is cleaning his BiPAP device as recommended and administering all respiratory medication appropriately.) Respiratory Self-Management Outcome: 4 (good)  Endocrine Endocrine Symptoms Reported: No symptoms reported Is patient diabetic?: No Is patient checking blood sugars at home?: No Endocrine Self-Management Outcome: 4 (good)  Gastrointestinal Gastrointestinal Symptoms Reported: No symptoms reported Gastrointestinal Management Strategies: Adequate rest, Coping strategies Gastrointestinal Self-Management Outcome: 4 (good)    Genitourinary Genitourinary Symptoms Reported: No symptoms reported Genitourinary Management Strategies: Medication therapy, Coping strategies Genitourinary Self-Management Outcome: 4 (good)  Integumentary Integumentary Symptoms Reported: No symptoms reported Additional Integumentary Details: Aware to continue to use precautionary measures taking the medication Xarelto . Skin Management Strategies: Coping strategies, Routine screening Skin Self-Management Outcome: 4 (good) Skin Comment: Dr. Lanny continue to be available with hemotology concerns pending upcoming labs.  Musculoskeletal Musculoskelatal Symptoms Reviewed: No symptoms reported Musculoskeletal Management Strategies: Routine screening, Coping strategies, Medication therapy Musculoskeletal Self-Management Outcome: 4 (good) Musculoskeletal Comment: uses assistive devices      Psychosocial Psychosocial Symptoms  Reported: No symptoms reported Behavioral Management Strategies: Medication therapy, Coping strategies Behavioral Health Self-Management Outcome: 4 (good)         Today's Vitals   05/29/24 1152  Weight: 192 lb (87.1 kg)   Pain Scale: 0-10 Pain Score: 0-No pain  Medications Reviewed Today     Reviewed by Alvia Olam BIRCH, RN (Registered Nurse) on 05/29/24 at 1139  Med List Status: <None>   Medication Order Taking? Sig Documenting Provider Last Dose Status Informant  albuterol  (PROVENTIL ) (2.5 MG/3ML) 0.083% nebulizer solution [id number] Yes Take 3 mLs (2.5 mg total) by nebulization every 2 (two) hours as needed for wheezing or shortness of breath. Darlean Ozell NOVAK, MD  Active Self, Pharmacy Records  albuterol  (VENTOLIN  HFA) 108 [phone] Base) MCG/ACT inhaler [id number] Yes INHALE 2 PUFFS INTO THE LUNGS EVERY 6 (SIX) HOURS AS NEEDED FOR WHEEZING OR SHORTNESS OF BREATH. Newlin, Enobong, MD  Active Self, Pharmacy Records  aspirin  EC 81 MG tablet [id number] Yes Take 81 mg by mouth daily. Swallow whole. [provider]  Active Self, Pharmacy Records  atorvastatin  (LIPITOR ) 80 MG tablet [id number] Yes Take 1 tablet (80 mg total) by mouth daily. Placey, Ronal Caldron, NP  Active   bisoprolol  (ZEBETA ) 5 MG tablet [id number] Yes Take 1 tablet (5 mg total) by mouth daily. Newlin, Enobong, MD  Active   budesonide  (PULMICORT ) 0.5 MG/2ML nebulizer solution [id number] Yes Take 2 mLs (0.5 mg total) by nebulization 2 (two) times daily. Waymond Cart, MD  Active   budesonide -glycopyrrolate -formoterol  (BREZTRI  AEROSPHERE) 160-9-4.8 MCG/ACT AERO inhaler [id number] Yes Take 2 puffs first thing in morning and then another 2 puffs about 12 hours later. Darlean Ozell NOVAK, MD  Active Pharmacy Records, Self  buPROPion  (WELLBUTRIN  SR) 150 MG 12 hr tablet [id number] Yes Take 1 tablet (150 mg total) by mouth daily for 3 days, THEN 1 tablet (150 mg total) 2 (two) times daily thereafter for smoking cessation. Newlin, Enobong, MD   Active   butalbital -acetaminophen -caffeine  (FIORICET )  50-325-40 MG tablet [id number]  Take 1 tablet by mouth every 12 (twelve) hours as needed for headache.  Patient not taking: Reported on 05/27/2024   Newlin, Enobong, MD  Active   cetirizine  (ZYRTEC ) 10 MG tablet [id number] Yes Take 1 tablet (10 mg total) by mouth daily. Newlin, Enobong, MD  Active   DULoxetine  (CYMBALTA ) 60 MG capsule [id number] Yes Take 1 capsule (60 mg total) by mouth daily. For chronic leg pains Delbert Clam, MD  Active   ergocalciferol  (DRISDOL ) 1.25 MG (50000 UT) capsule [id number] Yes Take 1 capsule (50,000 Units total) by mouth once a week. Newlin, Enobong, MD  Active Self, Pharmacy Records  ezetimibe  (ZETIA ) 10 MG tablet [id number] Yes Take 1 tablet (10 mg total) by mouth daily. Okey Vina GAILS, MD  Active   ferrous sulfate  (FEROSUL) 325 (65 FE) MG tablet [id number] Yes Take 1 tablet (325 mg total) by mouth 2 (two) times daily with a meal. Delbert Clam, MD  Active   fluticasone  (FLONASE ) 50 MCG/ACT nasal spray [id number] Yes Place 2 sprays into both nostrils daily. Newlin, Enobong, MD  Active   folic acid  (FOLVITE ) 1 MG tablet [id number] Yes Take 1 tablet (1 mg total) by mouth daily. Newlin, Enobong, MD  Active Self, Pharmacy Records  furosemide  (LASIX ) 40 MG tablet [id number] Yes Take 1 tablet (40 mg total) by mouth 2 (two) times daily. Newlin, Enobong, MD  Active   gabapentin  (NEURONTIN ) 300 MG capsule [id number]  Take 1 capsule (300 mg total) by mouth at bedtime. For leg pains  Patient not taking: Reported on 05/29/2024   Newlin, Enobong, MD  Active Self, Pharmacy Records  ipratropium-albuterol  (DUONEB) 0.5-2.5 (3) MG/3ML SOLN [id number]  Take 3 mLs by nebulization every 6 (six) hours.  Patient not taking: Reported on 05/29/2024   Waymond Cart, MD  Active   nitroGLYCERIN  (NITROSTAT ) 0.4 MG SL tablet [id number] Yes Place 0.4 mg under the tongue every 5 (five) minutes as needed for chest pain. [provider]  Active  Pharmacy Records, Self  OXYGEN  [id number] Yes Inhale 2 L/min into the lungs continuous.  Patient taking differently: Inhale 4 L/min into the lungs continuous.   [provider]  Active Self, Pharmacy Records  pantoprazole  (PROTONIX ) 40 MG tablet [id number] Yes Take 1 tablet (40 mg total) by mouth daily. Newlin, Enobong, MD  Active   rivaroxaban  (XARELTO ) 20 MG TABS tablet [id number] Yes Take 1 tablet (20 mg total) by mouth daily with supper. Newlin, Enobong, MD  Active   topiramate  (TOPAMAX ) 25 MG tablet [id number] Yes Take 1 tablet by mouth daily at night for 1 week  then increase to 1 tablet twice daily Delbert Clam, MD  Active     Discontinued 04/04/20 1029             Recommendation:   PCP Follow-up Continue Current Plan of Care  Follow Up Plan:   Telephone follow up appointment date/time:  06/25/2024 @ 11:30 AM   Olam Ku, RN, BSN [place]  [facility] RN Care Manager Direct Dial: [phone]  Fax: [phone]

## 2024-05-29 NOTE — Patient Instructions (Signed)
 Visit Information  Thank you for taking time to visit with me today. Please don't hesitate to contact me if I can be of assistance to you before our next scheduled appointment.  Your next care management appointment is by telephone on 06/25/2024 at 11:30 AM  Please call the care guide team at [phone] if you need to cancel, schedule, or reschedule an appointment.   Please call the Suicide and Crisis Lifeline: 988 call the [place]  National Suicide Prevention Lifeline: 978-040-2710 or TTY: 610-734-0202 TTY (717) 024-4149) to talk to a trained counselor call 1-800-273-TALK (toll free, 24 hour hotline) if you are experiencing a Mental Health or Behavioral Health Crisis or need someone to talk to.   Olam Ku, RN, BSN [place]  [facility], [facility] RN Care Manager Direct Dial: [phone]  Fax: [phone]

## 2024-05-29 NOTE — Telephone Encounter (Signed)
 Called patient   BP is 135/ 80  Recommm If BP is remains in 130s/ then I would add back Entresto  Take for a week   If BP 140s/ or above then add back Bidil   Pt has appt in March

## 2024-05-31 LAB — VITAMIN D, 25-HYDROXY, TOTAL: Vitamin D, 25-Hydroxy, Serum: 68 ng/mL

## 2024-06-01 ENCOUNTER — Encounter: Payer: Self-pay | Admitting: Internal Medicine

## 2024-06-01 ENCOUNTER — Ambulatory Visit: Admitting: Internal Medicine

## 2024-06-01 VITALS — BP 104/66 | HR 70 | Ht 74.0 in | Wt 189.0 lb

## 2024-06-01 DIAGNOSIS — J9611 Chronic respiratory failure with hypoxia: Secondary | ICD-10-CM

## 2024-06-01 DIAGNOSIS — F17210 Nicotine dependence, cigarettes, uncomplicated: Secondary | ICD-10-CM | POA: Diagnosis not present

## 2024-06-01 DIAGNOSIS — J449 Chronic obstructive pulmonary disease, unspecified: Secondary | ICD-10-CM | POA: Diagnosis not present

## 2024-06-01 DIAGNOSIS — J9612 Chronic respiratory failure with hypercapnia: Secondary | ICD-10-CM | POA: Diagnosis not present

## 2024-06-01 NOTE — Progress Notes (Signed)
 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 worse health since 2014 when arrived in [place] with doe > dx chf/ copd much worse 2017  With GOLD III criteria established 05/2016    Admit date: 01/09/2016 Discharge date: 01/13/2016  Discharge Diagnoses:  Acute respiratory failure secondary to CHF exacerbation -O2 sats upon admission 89% -CXR unremarkable for infection -Responded well to IV lasix -->po lasix  -Weaned off of O2-SpO2 95% on room air   Acute combined systolic/diastolic heart failure -BNP 2056.8 -Echocardiogram 09/20/2014 showed EF 40-45% -Transition to by mouth Lasix  20 mg daily -Echocardiogram 01/10/2016: EF 35-40%, diastolic dysfunction --AST 147, ALT 110 upon admission -Currently trending downward -hepatitis panel unremarkable -Patient denies drug/alcohol use -switch metoprolol -->coreg  -continue losartan      History of Present Illness  02/29/2016 1st [facility] office visit/ Sunny Gains  symb 160 2bid/ bid saba (not prn)  Chief Complaint  Patient presents with   Pulmonary Consult    Pt referred by Glendia Ferrier, PA, for cough, hemoptysis, and emphysema. Pt c/o hemoptysis x 3 weeks, pt states this occurs daily but has been improving. Pt c/o increase in SOB with activity and rest.   acute  onset epistaxis/ hemoptyis end of Auguest > ER eval 02/20/16 dx RLL pna by CTa chest > rx levaquin / prednisone  and minimal hemoptysis now  Doe = MMRC2 = can't walk a nl pace on a flat grade s sob but does fine slow and flat eg [employer] shopping rec Plan A = Automatic =  Symbicort  160 Take 2 puffs first thing in am and then another 2 puffs about 12 hours later.  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  Please schedule a follow up office visit in 2 weeks, sooner if needed with cxr on return > did not return   Admit date: 05/04/2016 Discharge date: 05/09/2016     DISCHARGE DIAGNOSES:  Principal Problem:   Hemoptysis   Community acquired pneumonia of right lower lobe of lung (HCC) - - FOB RLL tbbx ATYPICAL PNEUMOCYTES PROLIFERATION/ LLL BAL 05/08/16    Hypertension   COPD with acute exacerbation (HCC)   Nonischemic dilated cardiomyopathy (HCC)   Cigarette smoker   Chronic systolic CHF (congestive heart failure) (HCC)   06/03/16 to ER> only better while on neb    Date of Admission: 02/09/2024    Date of Discharge: 02/13/2024    COPD with acute exacerbation (HCC)   CAD (coronary artery disease)   Cigarette smoker   Type 2 diabetes mellitus (HCC)   Chronic respiratory failure with hypoxia (HCC)   Iron  deficiency anemia due to chronic blood loss   Peripheral vascular disease (HCC)   PAF (paroxysmal atrial fibrillation) (HCC)   Heart failure with improved ejection fraction (HFimpEF) (HCC)   Hypercarbia   SVT (supraventricular tachycardia) (HCC)   1.  Follow-up:             a.  COPD exacerbation -  the patient was recently treated for COPD exacerbation likely in the setting of continued smoking.  We started him on new nebulizer treatments and ordered him a BiPAP machine which he is to use at night.  He has a follow-up appointment with Dr. Darlean already scheduled.                          b.  Paroxysmal SVT - the patient had episodes of SVT during this admission, likely in the setting of LABA nebulizers and potentially multifocal atrial tachycardia.  We switched his metoprolol  to bisoprolol  at the recommendations of her cardiologist.  We initially discontinued his LABA nebulizers, but  our pulmonologist do not think this was a contributory factor.  He was discharged with cardiac monitoring to capture any further SVT episodes.  He has a follow-up appointment scheduled cardiology.               c.  CAD - the patient was complaining of chest tightness and had elevated troponins without EKG changes.  We continue to send medications please ensure the patient is continuing his home medications.               d. HFmrEF - patient was complaining of shortness of breath but was euvolemic.  We thought this was more so in the setting of COPD exacerbation.  New TTE showing LVEF of 45% with global hypokinesis.  We added Entresto  and switched his hydralazine  and Imdur  to BiDil  at the recommendations of cardiologist.  Please ensure that the patient is taking his medications.            03/09/2024  Post Hosp f/u ov/Karista Aispuro re: COPD GOLD 3/ 02 dep    maint on Breztri  x 2  / still smoking  Chief Complaint  Patient presents with   Hospitalization Follow-up    Doing okay.  Hospital follow up.  Review LCS CT scan   Dyspnea:  mostly just see doctors o/w stays home  Cough: mild rattle worse in am x one hour > min mucoid  Sleeping: flat bed one pillow s  resp cc  SABA use: neb 3 x daily  02: 4lpm 24/7  Patient Instructions  My office will be contacting you by phone for referral for CT chest around thanksgiving 2025  at [facility] radiology on [address]   The key is to stop smoking completely before smoking completely stops you! Make sure you check your oxygen  saturation  AT  your highest level of activity (not after you stop)   to be sure it stays over 90%   Please schedule a follow up visit in 3 months but call sooner if needed  with all respiratory medications /inhalers/ solutions in hand     LDSCT  05/06/24 : Centrilobular and paraseptal emphysema. Interval clearing of previously seen peribronchovascular ground-glass in the right middle lobe and subpleural nodular consolidation in the lateral  right lower lobe. Tiny juxtapleural nodules are considered benign. No pleural fluid. Airway is unremarkable.   06/01/2024  f/u ov/Avik Leoni re: COPD GOLD 3/ 02 dep   maint on Breztri    still smoking  Chief Complaint  Patient presents with   Medical Management of Chronic Issues   COPD    Breathing is about the same. He has had slight increase in cough and wheezing over the past 2 wks off and on- occ sputum is blood tinged.    Dyspnea:  only goes out to see the doctor /  Cough: worse x 3-4 weeks assoc with nasal congestion and mild epistaxis on dry 4lpm and flonase  bid  Sleeping: flat bed 2 pillows  resp cc on BiPAP nightly  SABA use: rarely needed  hfa or neb  02: 4lpm   Lung cancer screening :  q November/ utd     No obvious day to day or daytime variability or assoc purulent sputum or mucus plugs or  cp or chest tightness,   overt   hb symptoms.    Also denies any obvious fluctuation of symptoms with weather or environmental changes or other aggravating or alleviating factors except as outlined above   No unusual exposure hx or h/o childhood pna/ asthma or knowledge of premature birth.  Current Allergies, Complete Past Medical History, Past Surgical History, Family History, and Social History were reviewed in [employer] record.  ROS  The following are not active complaints unless bolded Hoarseness, sore throat, dysphagia, dental problems, itching, sneezing,  nasal congestion or discharge of excess mucus or purulent secretions, ear ache,   fever, chills, sweats, unintended wt loss or wt gain, classically pleuritic or exertional cp,  orthopnea pnd or arm/hand swelling  or leg swelling, presyncope, palpitations, abdominal pain, anorexia, nausea, vomiting, diarrhea  or change in bowel habits or change in bladder habits, change in stools or change in urine, dysuria, hematuria,  rash, arthralgias, visual complaints, headache, numbness, weakness or ataxia or problems with  walking or coordination,  change in mood or  memory.          Outpatient Medications Prior to Visit  Medication Sig Dispense Refill   albuterol  (PROVENTIL ) (2.5 MG/3ML) 0.083% nebulizer solution Take 3 mLs (2.5 mg total) by nebulization every 2 (two) hours as needed for wheezing or shortness of breath. 75 mL 1   albuterol  (VENTOLIN  HFA) 108 (90 Base) MCG/ACT inhaler INHALE 2 PUFFS INTO THE LUNGS EVERY 6 (SIX) HOURS AS NEEDED FOR WHEEZING OR SHORTNESS OF BREATH. 18 g 6   aspirin  EC 81 MG tablet Take 81 mg by mouth daily. Swallow whole.     bisoprolol  (ZEBETA ) 5 MG tablet Take 1 tablet (5 mg total) by mouth daily. 90 tablet 1   budesonide  (PULMICORT ) 0.5 MG/2ML nebulizer solution Take 2 mLs (0.5 mg total) by nebulization 2 (two) times daily. 120 mL 0   budesonide -glycopyrrolate -formoterol  (BREZTRI  AEROSPHERE) 160-9-4.8 MCG/ACT AERO inhaler Take 2 puffs first thing in morning and then another 2 puffs about 12 hours later. 10.7 g 11   buPROPion  (WELLBUTRIN  SR) 150 MG 12 hr tablet Take 1 tablet (150 mg total) by mouth daily for 3 days, THEN 1 tablet (150 mg total) 2 (two) times daily thereafter for smoking cessation. 60 tablet 3   cetirizine  (ZYRTEC ) 10 MG tablet Take 1 tablet (10 mg total) by mouth daily. 30 tablet 1   DULoxetine  (CYMBALTA ) 60 MG capsule Take 1 capsule (60 mg total) by mouth daily. For chronic leg pains 90 capsule 1   ergocalciferol  (DRISDOL ) 1.25 MG (50000 UT) capsule Take 1 capsule (50,000 Units total) by mouth once a week. 12 capsule 1   ezetimibe  (ZETIA ) 10 MG tablet Take 1 tablet (10 mg total) by mouth daily. 90 tablet 3   ferrous sulfate  (FEROSUL) 325 (65 FE) MG tablet Take 1 tablet (325 mg total) by mouth 2 (two) times daily with a meal. 180 tablet 1   fluticasone  (FLONASE ) 50 MCG/ACT nasal spray Place 2 sprays into both nostrils daily. 16 g 6   folic acid  (  FOLVITE ) 1 MG tablet Take 1 tablet (1 mg total) by mouth daily. 90 tablet 1   furosemide  (LASIX ) 40 MG tablet Take 1  tablet (40 mg total) by mouth 2 (two) times daily. 180 tablet 1   nitroGLYCERIN  (NITROSTAT ) 0.4 MG SL tablet Place 0.4 mg under the tongue every 5 (five) minutes as needed for chest pain.     pantoprazole  (PROTONIX ) 40 MG tablet Take 1 tablet (40 mg total) by mouth daily. 90 tablet 1   rivaroxaban  (XARELTO ) 20 MG TABS tablet Take 1 tablet (20 mg total) by mouth daily with supper. 90 tablet 1   topiramate  (TOPAMAX ) 25 MG tablet Take 1 tablet by mouth daily at night for 1 week  then increase to 1 tablet twice daily 60 tablet 1   atorvastatin  (LIPITOR ) 80 MG tablet Take 1 tablet (80 mg total) by mouth daily. 90 tablet 1   butalbital -acetaminophen -caffeine  (FIORICET ) 50-325-40 MG tablet Take 1 tablet by mouth every 12 (twelve) hours as needed for headache. (Patient not taking: Reported on 05/27/2024) 30 tablet 1   gabapentin  (NEURONTIN ) 300 MG capsule Take 1 capsule (300 mg total) by mouth at bedtime. For leg pains (Patient not taking: Reported on 05/27/2024) 90 capsule 1   ipratropium-albuterol  (DUONEB) 0.5-2.5 (3) MG/3ML SOLN Take 3 mLs by nebulization every 6 (six) hours. (Patient not taking: Reported on 05/27/2024) 360 mL 0   OXYGEN  Inhale 2 L/min into the lungs continuous. (Patient taking differently: Inhale 4 L/min into the lungs continuous.)     No facility-administered medications prior to visit.                Objective:   Physical Exam   Wts  06/01/2024    189  03/09/2024      200  07/04/2023     205  07/04/2022     212  01/01/2022      223 06/27/2021      226  04/03/2021    224  10/04/2020      217 04/04/2020    212 06/30/2019      189  01/28/2019      191 12/31/2018      189  10/22/2016      190  07/24/2016        209  06/26/2016        213  05/28/2016      194   02/29/16 177 lb 12.8 oz (80.6 kg)  02/24/16 182 lb 12.8 oz (82.9 kg)  02/20/16 175 lb (79.4 kg)    Vital signs reviewed  06/01/2024  - Note at rest 02 sats  93% on 4lpm cont    General appearance:    somber  rollator bound / rattling cough   HEENT :  Oropharynx  clear   Nasal turbinates mod L >R swelling s polyps or purulence   NECK :  without JVD/Nodes/TM/ nl carotid upstrokes bilaterally   LUNGS: no acc muscle use,  Mod barrel  contour chest wall with bilateral  Distant bs s audible wheeze and  without cough on insp or exp maneuvers and mod  Hyperresonant  to  percussion bilaterally     CV:  RRR  no s3 or murmur or increase in P2, and no edema   ABD:  soft and nontender with pos mid insp Hoover's  in the supine position. No bruits or organomegaly appreciated, bowel sounds nl  MS:   Ext warm without deformities or   obvious joint restrictions , calf tenderness,  cyanosis or clubbing  SKIN: warm and dry without lesions    NEURO:  alert, approp, nl sensorium with  no motor or cerebellar deficits apparent.                 Assessment & Plan:   Assessment & Plan Chronic respiratory failure with hypoxia and hypercapnia (HCC) See copd - 10/22/2016  Walked RA x 3 laps @ 185 ft each stopped due to  End of study moderate pace, no desat  - HCO03  11/11/18  = 41  - 12/31/2018   Walked 2lpm   2 laps @  approx 245ft each @ avg pace  stopped due to  Sob on 2lpm but no desats   - 01/28/2019   Walked RA x one lap =  approx 250 ft - stopped due to  Sob with sats ok on 3lpm POC > referred for POC  - HCO3  06/15/19 = 38  -  04/04/2020   Walked 3lpm  pulsed  3laps @ approx 232ft each @avg  pace  stopped due desats p second lap to 87% corrected on 4lpm pulsed   With sats 98% at end  - HCO3  02/13/21  = 41  - 04/03/2021   Walked on RA  desat to 84% p 250 ft   Then 250 ft on 2lpm cont avg pace sats 92% at lowest - 06/27/2021 Patient Saturations on Room Air at Rest =95%  while Ambulating = 88% and  on 3 Liters of oxygen  while Ambulating = 95%  - HC03  7/210/23  = 26  - HC03  02/13/24      = 44 - as of 03/09/2024 using 02 4lpm 24/7  - LDSCT  05/06/24 Centrilobular and paraseptal emphysema  Again advised: >>>  Make  sure you check your oxygen  saturation  AT  your highest level of activity (not after you stop)   to be sure it stays over 90% and adjust  02 flow upward to maintain this level if needed but remember to turn it back to previous settings when you stop (to conserve your supply).    >>> try humidify 02 and point flonase  spray  away from septum and toward ear on same side to see if this helps his chronic nasal congestion and intermittent low grade epistaxis  while on xarelto     COPD GOLD III/still smoking  Active smoker 02/29/2016  After extensive coaching HFA effectiveness =    75% > continue symbicort   - Spirometry 05/28/2016  FEV1 1.48 (40%)  Ratio 46  p am symbicort  160 2bid  - 06/26/2016  After extensive coaching HFA effectiveness =    90% > bevespi  2 bid and change coreg  to bisoprolol  5 mg daily > not done as of 07/24/2016 > at ov 10/22/2016 off coreg  and all inhalers and much better sob but still some cough on entresto  maint - Spirometry 10/22/2016  FEV1 1.24 (35%)  Ratio 48  PFT's  04/04/2020  FEV1 1.25 (35 % ) ratio 0.41  p 6 % improvement from saba p tudorza prior to study with DLCO  8.26 (27%) corrects to 1.38 (34%)  for alv volume and FV curve classic concave pattern  - 04/04/2020   breztri  2bid  - 03/09/2024  After extensive coaching inhaler device,  effectiveness =    90% from a baseline of around 50%  so continue breztri    Pt is Group B in terms of symptom/risk and laba/lama therefore appropriate rx at this point >>>  breztri  and approp saba:  Re SABA :  I spent extra time with pt today reviewing appropriate use of albuterol  for prn use on exertion with the following points: 1) saba is for relief of sob that does not improve by walking a slower pace or resting but rather if the pt does not improve after trying this first. 2) If the pt is convinced, as many are, that saba helps recover from activity faster then it's easy to tell if this is the case by re-challenging : ie stop, take the  inhaler, then p 5 minutes try the exact same activity (intensity of workload) that just caused the symptoms and see if they are substantially diminished or not after saba 3) if there is an activity that reproducibly causes the symptoms, try the saba 15 min before the activity on alternate days   If in fact the saba really does help, then fine to continue to use it prn but advised may need to look closer at the maintenance regimen being used (breztri ) to achieve better control of airways disease with exertion.   >>>mucinex  dm 1200 mg bid prn for cough / CB symptoms in active smoker        Cigarette smoker Counseled re importance of smoking cessation but did not meet time criteria for separate billing    >>>  keep up with LCS  program/ most recent study reviewed including relevance of emphysema finding  (like scarring of the pistons and valves on an engine with no air filter x years )      F/u yarly     Each RESP maintenance medication was reviewed in detail including emphasizing most importantly the difference between maintenance and prns and under what circumstances the prns are to be triggered using an action plan format where appropriate.  Total time for H and P, chart review, counseling, reviewing hfa/neb/ 02/ pulse ox/ nasal spray  device(s) and generating customized AVS unique to this office visit / same day charting = 34 min          AVS  Patient Instructions  We will call adapt to add humidity to your 02 circuit   For cough/ congestion >   mucinex  dm  up to maximum of  1200 mg every 12 hours as nasty   Remember to point flonase  toward your ear on the same side   Please schedule a follow up visit in 6  months but call sooner if needed         Ozell America, MD 06/01/2024                  "

## 2024-06-01 NOTE — Assessment & Plan Note (Addendum)
 See copd - 10/22/2016  Walked RA x 3 laps @ 185 ft each stopped due to  End of study moderate pace, no desat  - HCO03  11/11/18  = 41  - 12/31/2018   Walked 2lpm   2 laps @  approx 229ft each @ avg pace  stopped due to  Sob on 2lpm but no desats   - 01/28/2019   Walked RA x one lap =  approx 250 ft - stopped due to  Sob with sats ok on 3lpm POC > referred for POC  - HCO3  06/15/19 = 38  -  04/04/2020   Walked 3lpm  pulsed  3laps @ approx 212ft each @avg  pace  stopped due desats p second lap to 87% corrected on 4lpm pulsed   With sats 98% at end  - HCO3  02/13/21  = 41  - 04/03/2021   Walked on RA  desat to 84% p 250 ft   Then 250 ft on 2lpm cont avg pace sats 92% at lowest - 06/27/2021 Patient Saturations on Room Air at Rest =95%  while Ambulating = 88% and  on 3 Liters of oxygen  while Ambulating = 95%  - HC03  7/210/23  = 26  - HC03  02/13/24      = 44 - as of 03/09/2024 using 02 4lpm 24/7  - LDSCT  05/06/24 Centrilobular and paraseptal emphysema  Again advised: >>>  Make sure you check your oxygen  saturation  AT  your highest level of activity (not after you stop)   to be sure it stays over 90% and adjust  02 flow upward to maintain this level if needed but remember to turn it back to previous settings when you stop (to conserve your supply).    >>> try humidify 02 and point flonase  spray  away from septum and toward ear on same side to see if this helps his chronic nasal congestion and intermittent low grade epistaxis  while on xarelto 

## 2024-06-01 NOTE — Assessment & Plan Note (Addendum)
 Active smoker 02/29/2016  After extensive coaching HFA effectiveness =    75% > continue symbicort   - Spirometry 05/28/2016  FEV1 1.48 (40%)  Ratio 46  p am symbicort  160 2bid  - 06/26/2016  After extensive coaching HFA effectiveness =    90% > bevespi  2 bid and change coreg  to bisoprolol  5 mg daily > not done as of 07/24/2016 > at ov 10/22/2016 off coreg  and all inhalers and much better sob but still some cough on entresto  maint - Spirometry 10/22/2016  FEV1 1.24 (35%)  Ratio 48  PFT's  04/04/2020  FEV1 1.25 (35 % ) ratio 0.41  p 6 % improvement from saba p tudorza prior to study with DLCO  8.26 (27%) corrects to 1.38 (34%)  for alv volume and FV curve classic concave pattern  - 04/04/2020   breztri  2bid  - 03/09/2024  After extensive coaching inhaler device,  effectiveness =    90% from a baseline of around 50%  so continue breztri    Pt is Group B in terms of symptom/risk and laba/lama therefore appropriate rx at this point >>>  breztri  and approp saba:  Re SABA :  I spent extra time with pt today reviewing appropriate use of albuterol  for prn use on exertion with the following points: 1) saba is for relief of sob that does not improve by walking a slower pace or resting but rather if the pt does not improve after trying this first. 2) If the pt is convinced, as many are, that saba helps recover from activity faster then it's easy to tell if this is the case by re-challenging : ie stop, take the inhaler, then p 5 minutes try the exact same activity (intensity of workload) that just caused the symptoms and see if they are substantially diminished or not after saba 3) if there is an activity that reproducibly causes the symptoms, try the saba 15 min before the activity on alternate days   If in fact the saba really does help, then fine to continue to use it prn but advised may need to look closer at the maintenance regimen being used (breztri ) to achieve better control of airways disease with exertion.    >>>mucinex  dm 1200 mg bid prn for cough / CB symptoms in active smoker

## 2024-06-01 NOTE — Patient Instructions (Addendum)
 We will call adapt to add humidity to your 02 circuit   For cough/ congestion >   mucinex  dm  up to maximum of  1200 mg every 12 hours as nasty   Remember to point flonase  toward your ear on the same side   Please schedule a follow up visit in 6  months but call sooner if needed

## 2024-06-01 NOTE — Assessment & Plan Note (Addendum)
 Counseled re importance of smoking cessation but did not meet time criteria for separate billing    >>>  keep up with LCS  program/ most recent study reviewed including relevance of emphysema finding  (like scarring of the pistons and valves on an engine with no air filter x years )      F/u yarly     Each RESP maintenance medication was reviewed in detail including emphasizing most importantly the difference between maintenance and prns and under what circumstances the prns are to be triggered using an action plan format where appropriate.  Total time for H and P, chart review, counseling, reviewing hfa/neb/ 02/ pulse ox/ nasal spray  device(s) and generating customized AVS unique to this office visit / same day charting = 34 min

## 2024-06-02 NOTE — Telephone Encounter (Signed)
Results given over phone.

## 2024-06-10 ENCOUNTER — Inpatient Hospital Stay

## 2024-06-10 DIAGNOSIS — D500 Iron deficiency anemia secondary to blood loss (chronic): Secondary | ICD-10-CM

## 2024-06-10 DIAGNOSIS — D649 Anemia, unspecified: Secondary | ICD-10-CM

## 2024-06-10 LAB — CBC WITH DIFFERENTIAL (CANCER CENTER ONLY)
Abs Immature Granulocytes: 0.02 K/uL (ref 0.00–0.07)
Basophils Absolute: 0 K/uL (ref 0.0–0.1)
Basophils Relative: 0 %
Eosinophils Absolute: 0.1 K/uL (ref 0.0–0.5)
Eosinophils Relative: 1 %
HCT: 36 % — ABNORMAL LOW (ref 39.0–52.0)
Hemoglobin: 11 g/dL — ABNORMAL LOW (ref 13.0–17.0)
Immature Granulocytes: 0 %
Lymphocytes Relative: 17 %
Lymphs Abs: 1.2 K/uL (ref 0.7–4.0)
MCH: 29.6 pg (ref 26.0–34.0)
MCHC: 30.6 g/dL (ref 30.0–36.0)
MCV: 96.8 fL (ref 80.0–100.0)
Monocytes Absolute: 0.5 K/uL (ref 0.1–1.0)
Monocytes Relative: 7 %
Neutro Abs: 4.9 K/uL (ref 1.7–7.7)
Neutrophils Relative %: 75 %
Platelet Count: 222 K/uL (ref 150–400)
RBC: 3.72 MIL/uL — ABNORMAL LOW (ref 4.22–5.81)
RDW: 17.2 % — ABNORMAL HIGH (ref 11.5–15.5)
WBC Count: 6.7 K/uL (ref 4.0–10.5)
nRBC: 0 % (ref 0.0–0.2)

## 2024-06-10 LAB — IRON AND IRON BINDING CAPACITY (CC-WL,HP ONLY)
Iron: 108 ug/dL (ref 45–182)
Saturation Ratios: 39 % (ref 17.9–39.5)
TIBC: 277 ug/dL (ref 250–450)
UIBC: 169 ug/dL

## 2024-06-10 LAB — FERRITIN: Ferritin: 65 ng/mL (ref 24–336)

## 2024-06-15 ENCOUNTER — Encounter: Payer: Self-pay | Admitting: Nurse Practitioner

## 2024-06-15 ENCOUNTER — Other Ambulatory Visit (HOSPITAL_BASED_OUTPATIENT_CLINIC_OR_DEPARTMENT_OTHER): Payer: Self-pay

## 2024-06-15 ENCOUNTER — Other Ambulatory Visit: Payer: Self-pay | Admitting: Internal Medicine

## 2024-06-15 ENCOUNTER — Other Ambulatory Visit: Payer: Self-pay | Admitting: Family Medicine

## 2024-06-15 DIAGNOSIS — I110 Hypertensive heart disease with heart failure: Secondary | ICD-10-CM

## 2024-06-15 MED FILL — Nitroglycerin SL Tab 0.4 MG: SUBLINGUAL | 9 days supply | Qty: 25 | Fill #0 | Status: AC

## 2024-06-16 ENCOUNTER — Other Ambulatory Visit: Payer: Self-pay

## 2024-06-16 MED ORDER — FOLIC ACID 1 MG PO TABS
1.0000 mg | ORAL_TABLET | Freq: Every day | ORAL | 1 refills | Status: AC
  Filled 2024-06-16: qty 90, 90d supply, fill #0

## 2024-06-16 NOTE — Telephone Encounter (Signed)
 Requested medications are due for refill today.  yes  Requested medications are on the active medications list.  yes  Last refill. 11/13/2023 #12 1 rf  Future visit scheduled.   yes  Notes to clinic.  Provider to review at this dosage    Requested Prescriptions  Pending Prescriptions Disp Refills   ergocalciferol  (DRISDOL ) 1.25 MG (50000 UT) capsule 12 capsule 1    Sig: Take 1 capsule (50,000 Units total) by mouth once a week.     Endocrinology:  Vitamins - Vitamin D  Supplementation 2 Failed - 06/16/2024  3:25 PM      Failed - Manual Review: Route requests for 50,000 IU strength to the provider      Failed - Vitamin D  in normal range and within 360 days    Vit D, 25-Hydroxy  Date Value Ref Range Status  07/26/2022 26.5 (L) 30.0 - 100.0 ng/mL Final    Comment:    Vitamin D  deficiency has been defined by the Institute of Medicine and an Endocrine Society practice guideline as a level of serum 25-OH vitamin D  less than 20 ng/mL (1,2). The Endocrine Society went on to further define vitamin D  insufficiency as a level between 21 and 29 ng/mL (2). 1. IOM (Institute of Medicine). 2010. Dietary reference    intakes for calcium  and D. [place]: The    [employer]. 2. Holick MF, Binkley [place], Bischoff-Ferrari HA, et al.    Evaluation, treatment, and prevention of vitamin D     deficiency: an Endocrine Society clinical practice    guideline. JCEM. 2011 Jul; 96(7):1911-30.          Passed - Ca in normal range and within 360 days    Calcium   Date Value Ref Range Status  05/26/2024 9.4 8.6 - 10.2 mg/dL Final   Calcium , Ion  Date Value Ref Range Status  03/11/2024 1.19 1.15 - 1.40 mmol/L Final         Passed - Valid encounter within last 12 months    Recent Outpatient Visits           2 weeks ago Nocturia   [place] [facility] [place] [facility] Dept Of [place]. [facility] Placey, Ronal Caldron, NP   1 month ago Drug-induced headache, not elsewhere classified,  not intractable   [place] [facility] - [facility] Dept Of [place]. [facility] Delbert Clam, MD   3 months ago Epistaxis   [place] [facility] [place] - A Dept Of [place]. [facility] Delbert Clam, MD   6 months ago Iron  deficiency anemia due to chronic blood loss   [place] [facility] [place] - A Dept Of [place]. [facility] Delbert Clam, MD   9 months ago Vitamin D  deficiency   [place] [facility] [place] - A Dept Of [place]. [facility] Delbert Clam, MD              Signed Prescriptions Disp Refills   folic acid  (FOLVITE ) 1 MG tablet 90 tablet 1    Sig: Take 1 tablet (1 mg total) by mouth daily.     Endocrinology:  Vitamins Passed - 06/16/2024  3:25 PM      Passed - Valid encounter within last 12 months    Recent Outpatient Visits           2 weeks ago Nocturia   [place] [facility] [place] - [facility] Dept Of [place].  [facility] Placey, Ronal Caldron, NP   1 month ago Drug-induced headache, not elsewhere classified, not intractable   [place] [facility] - A Dept Of [place]. [facility] Delbert Clam, MD   3 months ago Epistaxis   [place] [facility] [place] - A Dept Of [place]. [facility] Delbert Clam, MD   6 months ago Iron  deficiency anemia due to chronic blood loss    [facility] [place] - A Dept Of [place]. [facility] Delbert Clam, MD   9 months ago Vitamin D  deficiency   [facility] - A Dept Of [place]. [facility] Delbert Clam, MD

## 2024-06-16 NOTE — Telephone Encounter (Signed)
 Requested Prescriptions  Pending Prescriptions Disp Refills   folic acid  (FOLVITE ) 1 MG tablet 90 tablet 1    Sig: Take 1 tablet (1 mg total) by mouth daily.     Endocrinology:  Vitamins Passed - 06/16/2024  3:23 PM      Passed - Valid encounter within last 12 months    Recent Outpatient Visits           2 weeks ago Nocturia   [place] [facility] [place] - [facility] Dept Of [place]. [facility] Placey, Ronal Caldron, NP   1 month ago Drug-induced headache, not elsewhere classified, not intractable   [place] [facility] - [facility] Dept Of [place]. [facility] Delbert Clam, MD   3 months ago Epistaxis   [place] [facility] [place] - A Dept Of [place]. [facility] Delbert Clam, MD   6 months ago Iron  deficiency anemia due to chronic blood loss   [place] [facility] [place] - A Dept Of [place]. [facility] Delbert Clam, MD   9 months ago Vitamin D  deficiency   [place] [facility] [place] - A Dept Of [place]. [facility] Delbert, Clam, MD               ergocalciferol  (DRISDOL ) 1.25 MG (50000 UT) capsule 12 capsule 1    Sig: Take 1 capsule (50,000 Units total) by mouth once a week.     Endocrinology:  Vitamins - Vitamin D  Supplementation 2 Failed - 06/16/2024  3:23 PM      Failed - Manual Review: Route requests for 50,000 IU strength to the provider      Failed - Vitamin D  in normal range and within 360 days    Vit D, 25-Hydroxy  Date Value Ref Range Status  07/26/2022 26.5 (L) 30.0 - 100.0 ng/mL Final    Comment:    Vitamin D  deficiency has been defined by the Institute of Medicine and an Endocrine Society practice guideline as a level of serum 25-OH vitamin D  less than 20 ng/mL (1,2). The Endocrine Society went on to further define vitamin D  insufficiency as a level between 21 and 29 ng/mL (2). 1. IOM (Institute of Medicine). 2010. Dietary reference    intakes for calcium  and D. [place]: The     [employer]. 2. Holick MF, Binkley [place], Bischoff-Ferrari HA, et al.    Evaluation, treatment, and prevention of vitamin D     deficiency: an Endocrine Society clinical practice    guideline. JCEM. 2011 Jul; 96(7):1911-30.          Passed - Ca in normal range and within 360 days    Calcium   Date Value Ref Range Status  05/26/2024 9.4 8.6 - 10.2 mg/dL Final   Calcium , Ion  Date Value Ref Range Status  03/11/2024 1.19 1.15 - 1.40 mmol/L Final         Passed - Valid encounter within last 12 months    Recent Outpatient Visits           2 weeks ago Nocturia   [place] [facility] [place] - A Dept Of [place]. [facility] Placey, Ronal Caldron, NP   1 month ago Drug-induced headache, not elsewhere classified, not intractable   [place] [facility] - A Dept Of [place]. [facility] Delbert Clam, MD   3 months ago Epistaxis    [facility] [place] - A  Dept Of [place]. [facility] Delbert Clam, MD   6 months ago Iron  deficiency anemia due to chronic blood loss   [place] [facility] [place] - A Dept Of [place]. [facility] Delbert Clam, MD   9 months ago Vitamin D  deficiency   [place] [facility] [place] - A Dept Of [place]. [facility] Delbert Clam, MD

## 2024-06-17 ENCOUNTER — Other Ambulatory Visit: Payer: Self-pay

## 2024-06-18 ENCOUNTER — Other Ambulatory Visit: Payer: Self-pay

## 2024-06-25 ENCOUNTER — Other Ambulatory Visit: Payer: Self-pay

## 2024-06-25 NOTE — Patient Instructions (Signed)
 Visit Information  Thank you for taking time to visit with me today. Please don't hesitate to contact me if I can be of assistance to you before our next scheduled appointment.  Your next care management appointment is by telephone on 07/16/2024 at 9:30 am   Please call the care guide team at [phone] if you need to cancel, schedule, or reschedule an appointment.   Please call the [place]  National Suicide Prevention Lifeline: (437)812-0308 or TTY: (843)584-4216 TTY [phone]) to talk to a trained counselor if you are experiencing a Mental Health or Behavioral Health Crisis or need someone to talk to.  Elida Pulse, RNCM Case Manager [facility], Population Health Direct Dial: [phone]

## 2024-06-25 NOTE — Patient Outreach (Signed)
 Complex Care Management   Visit Note  06/25/2024  Name:  Jared [name] MRN: [id number] DOB: 1956-03-22  Situation: Referral received for Complex Care Management related to COPD I obtained verbal consent from Patient.  Visit completed with Mr. Pecore  on the phone  Mr. Fargnoli saw pulmonologist on 12/22 and was instructed to use Muscinex for his cough and added humidity to Oxygen  circuit due to occasional blood tinged sputum. Continues with Oxygen  @4L  via nasal canula. Pt reports wheezing and cough are much better now. He did not cough at all during our telephone visit. He does not have access to MyChart right now so this RNCM will help him get in. Code sent via text. He admits he is having some hand shaking almost daily and rarely his entire body will shake. There is no LOC or other symptoms. Suggested he speak with PCP about these symptoms at visit in March or call them sooner if they worsen. Encouraged him to make a list of questions for his doctors so he does not forget to ask the questions he has. Reports sleeping too good because he gets up and then will fall back asleep in his chair. Encouraged him to try to sit near a window with good sunshine or get outside in the sun in the morning for at least 10 min to help balance his circadian rhythms so he could feel more awake during the day and he agreed to try. Patient report being in the GREEN Zone today with his COPD. Reviewed yellow and red zones and when to call MD. Is taking all his meds as ordered and trying to eat healthy. Reminded him of upcoming Oncology visit.   Background:   Past Medical History:  Diagnosis Date   Acute blood loss anemia 05/18/2023   Acute perforated appendicitis 04/30/2023   AKI (acute kidney injury) 11/18/2020   Atrial fibrillation with RVR (HCC) 05/06/2023   AVM (arteriovenous malformation)    CAD (coronary artery disease)    a. LHC 5/12:  LAD 20, pLCx 20, pRCA 40, dRCA 40, EF 35%, diff HK  //  b. Myoview 4/16: Overall  Impression:  High risk stress nuclear study There is no evidence of ischemia.  There is severe LV dysfunction. LV Ejection Fraction: 30%.  LV Wall Motion:  There is global LV hypokinesis.     CAP (community acquired pneumonia) 09/2013   Chronic combined systolic and diastolic CHF (congestive heart failure) (HCC)    a. Echo 4/16:Mild LVH, EF 40-45%, diffuse HK //  b. Echo 8/17: EF 35-40%, diffuse HK, diastolic dysfunction, aortic sclerosis, trivial MR, moderate LAE, normal RVSF, moderate RAE, mild TR, PASP 42 mmHg // c. Echo 4/18: Mild concentric LVH, EF 30-35, normal wall motion, grade 1 diastolic dysfunction, PASP 49   Chronic respiratory failure (HCC)    Cluster headache    hx; haven't had one in awhile (01/09/2016)   COPD (chronic obstructive pulmonary disease) (HCC)    thelbert 01/09/2016   DM (diabetes mellitus) (HCC)    History of CVA (cerebrovascular accident)    Hypertension    IDA (iron  deficiency anemia)    Moderate tobacco use disorder    NICM (nonischemic cardiomyopathy) (HCC)    Nicotine  addiction    Peripheral vascular disease    Syncope 06/11/2019   Tobacco abuse    Type 2 diabetes mellitus (HCC) 05/14/2016    Assessment: Patient Reported Symptoms:  Cognitive Cognitive Status: No symptoms reported, Insightful and able to interpret abstract concepts, Normal speech and  language skills, Alert and oriented to person, place, and time Cognitive/Intellectual Conditions Management [RPT]: None reported or documented in medical history or problem list   Health Maintenance Behaviors: Annual physical exam, Sleep adequate, Healthy diet Health Facilitated by: Healthy diet, Rest  Neurological      HEENT HEENT Symptoms Reported: No symptoms reported HEENT Management Strategies: Medical device, Adequate rest HEENT Comment: No recent nose bleeds since having humidity added to oxygen     Cardiovascular Cardiovascular Symptoms Reported: No symptoms reported Patient's Recent BP reading at  home: Denies any chest tightness or swelling in feet/ankles today. Cardiovascular Management Strategies: Medication therapy, Routine screening, Adequate rest, Coping strategies Cardiovascular Self-Management Outcome: 4 (good)  Respiratory Respiratory Symptoms Reported: Other: Other Respiratory Symptoms: Confirmed in GREEN Zone today. Since having humidity added to oxygen  and Muscinex wheezing  and cough are much beeter. Additional Respiratory Details: Pt continues to smoke a few a day. Respiratory Management Strategies: Adequate rest, Coping strategies, Oxygen  therapy, Routine screening, Medication therapy Respiratory Self-Management Outcome: 4 (good)  Endocrine Endocrine Symptoms Reported: No symptoms reported Is patient diabetic?: No Is patient checking blood sugars at home?: No Endocrine Self-Management Outcome: 5 (very good)  Gastrointestinal Gastrointestinal Symptoms Reported: No symptoms reported Gastrointestinal Management Strategies: Adequate rest, Coping strategies Gastrointestinal Self-Management Outcome: 4 (good)    Genitourinary Genitourinary Symptoms Reported: No symptoms reported Genitourinary Management Strategies: Medication therapy Genitourinary Self-Management Outcome: 4 (good)  Integumentary Integumentary Symptoms Reported: Not assessed    Musculoskeletal Musculoskelatal Symptoms Reviewed: Not assessed        Psychosocial Psychosocial Symptoms Reported: Not assessed          06/25/2024    PHQ2-9 Depression Screening   Little interest or pleasure in doing things    Feeling down, depressed, or hopeless    PHQ-2 - Total Score    Trouble falling or staying asleep, or sleeping too much    Feeling tired or having little energy    Poor appetite or overeating     Feeling bad about yourself - or that you are a failure or have let yourself or your family down    Trouble concentrating on things, such as reading the newspaper or watching television    Moving or speaking  so slowly that other people could have noticed.  Or the opposite - being so fidgety or restless that you have been moving around a lot more than usual    Thoughts that you would be better off dead, or hurting yourself in some way    PHQ2-9 Total Score    If you checked off any problems, how difficult have these problems made it for you to do your work, take care of things at home, or get along with other people    Depression Interventions/Treatment      There were no vitals filed for this visit. Pain Scale: 0-10 Pain Score: 0-No pain  Medications Reviewed Today     Reviewed by Viveca Elida NOVAK, RN (Registered Nurse) on 06/25/24 at 1148  Med List Status: <None>   Medication Order Taking? Sig Documenting Provider Last Dose Status Informant  albuterol  (PROVENTIL ) (2.5 MG/3ML) 0.083% nebulizer solution [id number] Yes Take 3 mLs (2.5 mg total) by nebulization every 2 (two) hours as needed for wheezing or shortness of breath. Darlean Ozell NOVAK, MD  Active Self, Pharmacy Records  albuterol  (VENTOLIN  HFA) 108 [phone] Base) MCG/ACT inhaler [id number] Yes INHALE 2 PUFFS INTO THE LUNGS EVERY 6 (SIX) HOURS AS NEEDED FOR WHEEZING OR SHORTNESS OF BREATH. Newlin,  Corrina, MD  Active Self, Pharmacy Records  aspirin  EC 81 MG tablet [id number] Yes Take 81 mg by mouth daily. Swallow whole. [provider]  Active Self, Pharmacy Records  atorvastatin  (LIPITOR ) 80 MG tablet [id number] Yes Take 1 tablet (80 mg total) by mouth daily. Placey, Ronal Caldron, NP  Active   bisoprolol  (ZEBETA ) 5 MG tablet [id number] Yes Take 1 tablet (5 mg total) by mouth daily. Newlin, Enobong, MD  Active   budesonide -glycopyrrolate -formoterol  (BREZTRI  AEROSPHERE) 160-9-4.8 MCG/ACT AERO inhaler [id number] Yes Take 2 puffs first thing in morning and then another 2 puffs about 12 hours later. Darlean Ozell NOVAK, MD  Active Pharmacy Records, Self  buPROPion  (WELLBUTRIN  SR) 150 MG 12 hr tablet [id number] Yes Take 1 tablet (150 mg total) by mouth daily  for 3 days, THEN 1 tablet (150 mg total) 2 (two) times daily thereafter for smoking cessation. Newlin, Enobong, MD  Active   butalbital -acetaminophen -caffeine  (FIORICET ) 50-325-40 MG tablet [id number]  Take 1 tablet by mouth every 12 (twelve) hours as needed for headache.  Patient not taking: Reported on 06/25/2024   Newlin, Enobong, MD  Active   cetirizine  (ZYRTEC ) 10 MG tablet [id number] Yes Take 1 tablet (10 mg total) by mouth daily. Newlin, Enobong, MD  Active   DULoxetine  (CYMBALTA ) 60 MG capsule [id number] Yes Take 1 capsule (60 mg total) by mouth daily. For chronic leg pains Delbert Corrina, MD  Active   ergocalciferol  (DRISDOL ) 1.25 MG (50000 UT) capsule [id number] Yes Take 1 capsule (50,000 Units total) by mouth once a week. Newlin, Enobong, MD  Active Self, Pharmacy Records  ezetimibe  (ZETIA ) 10 MG tablet [id number] Yes Take 1 tablet (10 mg total) by mouth daily. Okey Vina GAILS, MD  Active   ferrous sulfate  [facility]) 325 (65 FE) MG tablet [id number] Yes Take 1 tablet (325 mg total) by mouth 2 (two) times daily with a meal. Delbert Corrina, MD  Active   fluticasone  (FLONASE ) 50 MCG/ACT nasal spray [id number] Yes Place 2 sprays into both nostrils daily. Newlin, Enobong, MD  Active   folic acid  (FOLVITE ) 1 MG tablet [id number]  Take 1 tablet (1 mg total) by mouth daily. Newlin, Enobong, MD  Active   furosemide  (LASIX ) 40 MG tablet [id number] Yes Take 1 tablet (40 mg total) by mouth 2 (two) times daily. Newlin, Enobong, MD  Active   gabapentin  (NEURONTIN ) 300 MG capsule [id number]  Take 1 capsule (300 mg total) by mouth at bedtime. For leg pains  Patient not taking: Reported on 06/25/2024   Newlin, Enobong, MD  Active Self, Pharmacy Records  ipratropium-albuterol  (DUONEB) 0.5-2.5 (3) MG/3ML SOLN [id number] Yes Take 3 mLs by nebulization every 6 (six) hours. Waymond Cart, MD  Active   nitroGLYCERIN  (NITROSTAT ) 0.4 MG SL tablet [id number] Yes PLACE 1 TABLET (0.4 MG TOTAL) UNDER THE TONGUE EVERY 5 (FIVE) MINUTES  AS NEEDED FOR CHEST PAIN. CALL 911 IF SYMPTOMS DO NOT RESOLVE AFTER 3 MINUTES Okey Vina GAILS, MD  Active   OXYGEN  [id number] Yes Inhale 2 L/min into the lungs continuous. [provider]  Active Self, Pharmacy Records  pantoprazole  (PROTONIX ) 40 MG tablet [id number] Yes Take 1 tablet (40 mg total) by mouth daily. Newlin, Enobong, MD  Active   rivaroxaban  (XARELTO ) 20 MG TABS tablet [id number] Yes Take 1 tablet (20 mg total) by mouth daily with supper. Newlin, Enobong, MD  Active   topiramate  (TOPAMAX ) 25 MG tablet [id number] Yes Take 1 tablet by mouth daily at night for 1 week  then increase to 1 tablet  twice daily Delbert Clam, MD  Active     Discontinued 04/04/20 1029             Recommendation:   Continue Current Plan of Care Regain access to your MyChart  Follow Up Plan:   Telephone follow up appointment date/time:  07/16/2024 @ 9:30am  Elida Pulse, RNCM Case Manager [facility], [facility] Direct Dial: [phone]

## 2024-06-26 ENCOUNTER — Telehealth: Payer: Self-pay

## 2024-06-26 NOTE — Telephone Encounter (Signed)
 Auth Submission: NO AUTH NEEDED Site of care: Site of care: [facility] Payer: [employer] medicare and [place] Medicaid Medication & CPT/J Code(s) submitted: Venofer  (Iron  Sucrose) J1756 Route of submission (phone, fax, portal):  Phone # Fax # Auth type: Buy/Bill PB Units/visits requested: 200mg  x 4 doses remaining Reference number:  Approval from: 10/22/23 to 06/10/25

## 2024-06-29 ENCOUNTER — Other Ambulatory Visit: Payer: Self-pay

## 2024-07-07 NOTE — Assessment & Plan Note (Signed)
 secondary to h/o GI bleed, PUD, and inadequate iron  replacement -First found to have iron  deficiency 09/05/2018 with serum iron  9, 3% saturation, TIBC 358, and ferritin of 3 with a hemoglobin of 4.9, s/p blood transfusion  -EGD showed esophagitis, colonoscopy and small bowel endoscopy were unremarkable. Anemia improved -Developed acute on chronic anemia 10/2018, small bowel endoscopy  -Began receiving IV iron  at that time (Feraheme  510 mg x1 episodically), and been on iv venofer  lately, responded well  -Capsule endoscopy 06/18/19 showed gastritis, no AVMs or bleeding.  -continue iv iron  as needed, if ferrintin<200

## 2024-07-08 ENCOUNTER — Other Ambulatory Visit (HOSPITAL_COMMUNITY): Payer: Self-pay | Admitting: Hematology

## 2024-07-08 ENCOUNTER — Inpatient Hospital Stay: Attending: Nurse Practitioner | Admitting: Hematology

## 2024-07-08 ENCOUNTER — Inpatient Hospital Stay

## 2024-07-08 VITALS — BP 117/68 | HR 78 | Temp 94.8°F | Resp 17 | Wt 192.4 lb

## 2024-07-08 DIAGNOSIS — D500 Iron deficiency anemia secondary to blood loss (chronic): Secondary | ICD-10-CM

## 2024-07-08 DIAGNOSIS — D649 Anemia, unspecified: Secondary | ICD-10-CM

## 2024-07-08 LAB — IRON AND IRON BINDING CAPACITY (CC-WL,HP ONLY)
Iron: 162 ug/dL (ref 45–182)
Saturation Ratios: 51 % — ABNORMAL HIGH (ref 17.9–39.5)
TIBC: 318 ug/dL (ref 250–450)
UIBC: 156 ug/dL

## 2024-07-08 LAB — CBC WITH DIFFERENTIAL (CANCER CENTER ONLY)
Abs Immature Granulocytes: 0.02 10*3/uL (ref 0.00–0.07)
Basophils Absolute: 0 10*3/uL (ref 0.0–0.1)
Basophils Relative: 0 %
Eosinophils Absolute: 0.1 10*3/uL (ref 0.0–0.5)
Eosinophils Relative: 2 %
HCT: 35.9 % — ABNORMAL LOW (ref 39.0–52.0)
Hemoglobin: 10.9 g/dL — ABNORMAL LOW (ref 13.0–17.0)
Immature Granulocytes: 0 %
Lymphocytes Relative: 21 %
Lymphs Abs: 1.6 10*3/uL (ref 0.7–4.0)
MCH: 30 pg (ref 26.0–34.0)
MCHC: 30.4 g/dL (ref 30.0–36.0)
MCV: 98.9 fL (ref 80.0–100.0)
Monocytes Absolute: 0.6 10*3/uL (ref 0.1–1.0)
Monocytes Relative: 8 %
Neutro Abs: 5.2 10*3/uL (ref 1.7–7.7)
Neutrophils Relative %: 69 %
Platelet Count: 259 10*3/uL (ref 150–400)
RBC: 3.63 MIL/uL — ABNORMAL LOW (ref 4.22–5.81)
RDW: 14.7 % (ref 11.5–15.5)
WBC Count: 7.5 10*3/uL (ref 4.0–10.5)
nRBC: 0 % (ref 0.0–0.2)

## 2024-07-08 LAB — FERRITIN: Ferritin: 37 ng/mL (ref 24–336)

## 2024-07-08 NOTE — Addendum Note (Signed)
 Addended by: DAYNE SHERRY RAMAN on: 07/08/2024 02:06 PM   Modules accepted: Orders

## 2024-07-08 NOTE — Progress Notes (Signed)
 Reviewed Dr. Demetra OV note from today. Plan to re-dose Venofer  300mg  IV x 2 doses. Therapy plan placed for [facility]  Sherry Pennant, PharmD, MPH, BCPS, CPP Clinical Pharmacist

## 2024-07-08 NOTE — Progress Notes (Signed)
 " [facility]   [phone]) [phone] [phone]) [phone]   Clinic Follow up Note   Patient Care Team: Delbert Clam, MD as PCP - General (Family Medicine) Okey Vina GAILS, MD as PCP - Cardiology (Cardiology) [facility] Burton, Lacie K, NP as Nurse Practitioner (Hematology and Oncology) Joya Ade, DPM as Consulting Physician (Podiatry) Viveca Elida NOVAK, RN as [facility] Care Management  Date of Service:  07/08/2024  CHIEF COMPLAINT: f/u of iron  deficient anemia  CURRENT THERAPY:  IV iron  as needed if ferritin less than 200  Oncology History   Iron  deficiency anemia due to chronic blood loss secondary to h/o GI bleed, PUD, and inadequate iron  replacement -First found to have iron  deficiency 09/05/2018 with serum iron  9, 3% saturation, TIBC 358, and ferritin of 3 with a hemoglobin of 4.9, s/p blood transfusion  -EGD showed esophagitis, colonoscopy and small bowel endoscopy were unremarkable. Anemia improved -Developed acute on chronic anemia 10/2018, small bowel endoscopy  -Began receiving IV iron  at that time (Feraheme  510 mg x1 episodically), and been on iv venofer  lately, responded well  -Capsule endoscopy 06/18/19 showed gastritis, no AVMs or bleeding.  -continue iv iron  as needed, if ferrintin<200  Assessment & Plan Iron  deficiency anemia due to chronic blood loss Anemia remains well-managed with a mild, gradual decline in hemoglobin from 11.8 to 10.9 g/dL over several visits. He has not required intravenous iron  since August of last year and currently reports no symptoms of gastrointestinal bleeding or anemia. Iron  levels are within normal limits; ferritin is pending. No acute concerns at present. - Monitored blood counts during this visit and ordered ferritin level. - Will administer intravenous iron  if ferritin is low. - Provided education regarding signs of gastrointestinal bleeding (melena, hematochezia) and increased fatigue; instructed to  contact the office promptly if these symptoms occur or if anemia symptoms develop. - Scheduled follow-up in nine months and will monitor blood counts every three months.   Plan - Lab reviewed, ferritin 37, hemoglobin 10.9, will arrange Venofer  300 mg x 2 - Lab every 3 months and follow-up in 9 months  Discussed the use of AI scribe software for clinical note transcription with the patient, who gave verbal consent to proceed.  History of Present Illness Jared [name] is a 69 year old male with iron  deficiency anemia due to chronic blood loss who presents for hematology follow-up to monitor anemia status.  He previously required IV iron  about every two months, with the last infusion in August of last year, and has not needed further IV iron  since.  He has no new symptoms and denies gastrointestinal bleeding, hematochezia, melena, or increased fatigue.  Hemoglobin has slowly declined from 11.8 to 11.0 to 10.9 g/dL but is relatively stable. Iron  levels today are normal. Ferritin is pending.  He has reliable family support for transportation to appointments.     All other systems were reviewed with the patient and are negative.  MEDICAL HISTORY:  Past Medical History:  Diagnosis Date   Acute blood loss anemia 05/18/2023   Acute perforated appendicitis 04/30/2023   AKI (acute kidney injury) 11/18/2020   Atrial fibrillation with RVR (HCC) 05/06/2023   AVM (arteriovenous malformation)    CAD (coronary artery disease)    a. LHC 5/12:  LAD 20, pLCx 20, pRCA 40, dRCA 40, EF 35%, diff HK  //  b. Myoview 4/16: Overall Impression:  High risk stress nuclear study There is no evidence of ischemia.  There is severe LV dysfunction. LV Ejection  Fraction: 30%.  LV Wall Motion:  There is global LV hypokinesis.     CAP (community acquired pneumonia) 09/2013   Chronic combined systolic and diastolic CHF (congestive heart failure) (HCC)    a. Echo 4/16:Mild LVH, EF 40-45%, diffuse HK //  b. Echo 8/17: EF  35-40%, diffuse HK, diastolic dysfunction, aortic sclerosis, trivial MR, moderate LAE, normal RVSF, moderate RAE, mild TR, PASP 42 mmHg // c. Echo 4/18: Mild concentric LVH, EF 30-35, normal wall motion, grade 1 diastolic dysfunction, PASP 49   Chronic respiratory failure (HCC)    Cluster headache    hx; haven't had one in awhile (01/09/2016)   COPD (chronic obstructive pulmonary disease) (HCC)    thelbert 01/09/2016   DM (diabetes mellitus) (HCC)    History of CVA (cerebrovascular accident)    Hypertension    IDA (iron  deficiency anemia)    Moderate tobacco use disorder    NICM (nonischemic cardiomyopathy) (HCC)    Nicotine  addiction    Peripheral vascular disease    Syncope 06/11/2019   Tobacco abuse    Type 2 diabetes mellitus (HCC) 05/14/2016    SURGICAL HISTORY: Past Surgical History:  Procedure Laterality Date   ABDOMINAL AORTOGRAM W/LOWER EXTREMITY N/A 12/07/2022   Procedure: ABDOMINAL AORTOGRAM W/LOWER EXTREMITY;  Surgeon: Magda Debby SAILOR, MD;  [facility]: [facility];  Service: Cardiovascular;  Laterality: N/A;   ABDOMINAL AORTOGRAM W/LOWER EXTREMITY Left 08/02/2023   Procedure: ABDOMINAL AORTOGRAM W/LOWER EXTREMITY;  Surgeon: Magda Debby SAILOR, MD;  Location: [facility];  Service: Cardiovascular;  Laterality: Left;   BALLOON ENTEROSCOPY N/A 11/19/2023   Procedure: ENTEROSCOPY, USING BALLOON;  Surgeon: San Sandor GAILS, DO;  Location: [facility];  Service: Gastroenterology;  Laterality: N/A;  single balloon enteroscopy   BIOPSY  11/12/2020   Procedure: BIOPSY;  Surgeon: Wilhelmenia Aloha Raddle., MD;  Location: [facility];  Service: Gastroenterology;;   CARDIAC CATHETERIZATION  10/2010   LM normal, LAD with 20% irregularities, LCX with 20%, RCA with 40% prox and 40% distal - EF of 35%   CATARACT EXTRACTION, BILATERAL     COLONOSCOPY N/A 03/23/2024   Procedure: COLONOSCOPY;  Surgeon: Legrand Victory LITTIE DOUGLAS, MD;  Location: [facility];  Service: Gastroenterology;   Laterality: N/A;   COLONOSCOPY W/ BIOPSIES AND POLYPECTOMY     COLONOSCOPY WITH PROPOFOL  N/A 09/06/2018   Procedure: COLONOSCOPY WITH PROPOFOL ;  Surgeon: Eda Iha, MD;  Location: [facility] ENDOSCOPY;  Service: Gastroenterology;  Laterality: N/A;   ENTEROSCOPY N/A 09/28/2018   Procedure: ENTEROSCOPY;  Surgeon: Rollin Dover, MD;  Location: [facility] ENDOSCOPY;  Service: Endoscopy;  Laterality: N/A;   ENTEROSCOPY N/A 10/28/2018   Procedure: ENTEROSCOPY;  Surgeon: Eda Iha, MD;  Location: [facility] ENDOSCOPY;  Service: Gastroenterology;  Laterality: N/A;   ENTEROSCOPY N/A 10/09/2020   Procedure: ENTEROSCOPY;  Surgeon: Abran Norleen SAILOR, MD;  Location: [facility] ENDOSCOPY;  Service: Endoscopy;  Laterality: N/A;   ENTEROSCOPY N/A 03/22/2022   Procedure: ENTEROSCOPY;  Surgeon: Leigh Elspeth SQUIBB, MD;  Location: [facility] ENDOSCOPY;  Service: Gastroenterology;  Laterality: N/A;   ENTEROSCOPY N/A 05/20/2023   Procedure: ENTEROSCOPY;  Surgeon: Legrand Victory LITTIE DOUGLAS, MD;  Location: [facility] ENDOSCOPY;  Service: Gastroenterology;  Laterality: N/A;   ENTEROSCOPY N/A 08/24/2023   Procedure: ENTEROSCOPY;  Surgeon: Suzann [name] HERO, MD;  Location: [facility] ENDOSCOPY;  Service: Gastroenterology;  Laterality: N/A;   ESOPHAGOGASTRODUODENOSCOPY N/A 11/12/2020   Procedure: ESOPHAGOGASTRODUODENOSCOPY (EGD);  Surgeon: Wilhelmenia Aloha Raddle., MD;  Location: [facility];  Service: Gastroenterology;  Laterality: N/A;   ESOPHAGOGASTRODUODENOSCOPY (  EGD) WITH PROPOFOL  N/A 09/05/2018   Procedure: ESOPHAGOGASTRODUODENOSCOPY (EGD) WITH PROPOFOL ;  Surgeon: Leigh Elspeth SQUIBB, MD;  Location: [facility] ENDOSCOPY;  Service: Gastroenterology;  Laterality: N/A;   ESOPHAGOGASTRODUODENOSCOPY (EGD) WITH PROPOFOL  N/A 11/19/2020   Procedure: ESOPHAGOGASTRODUODENOSCOPY (EGD) WITH PROPOFOL ;  Surgeon: Albertus Gordy HERO, MD;  Location: [facility];  Service: Gastroenterology;  Laterality: N/A;   ESOPHAGOGASTRODUODENOSCOPY (EGD) WITH PROPOFOL  N/A 12/27/2022   Procedure:  ESOPHAGOGASTRODUODENOSCOPY (EGD) WITH PROPOFOL ;  Surgeon: Shila Gustav GAILS, MD;  Location: [facility];  Service: Gastroenterology;  Laterality: N/A;   EXCISION MASS HEAD     GIVENS CAPSULE STUDY N/A 09/06/2018   Procedure: GIVENS CAPSULE STUDY;  Surgeon: Eda Iha, MD;  Location: [facility] ENDOSCOPY;  Service: Gastroenterology;  Laterality: N/A;   GIVENS CAPSULE STUDY N/A 09/26/2018   Procedure: GIVENS CAPSULE STUDY;  Surgeon: Albertus Gordy HERO, MD;  Location: [facility] ENDOSCOPY;  Service: Gastroenterology;  Laterality: N/A;   GIVENS CAPSULE STUDY N/A 06/14/2019   Procedure: GIVENS CAPSULE STUDY;  Surgeon: Shila Gustav GAILS, MD;  Location: [facility];  Service: Endoscopy;  Laterality: N/A;   HEMOSTASIS CLIP PLACEMENT  11/12/2020   Procedure: HEMOSTASIS CLIP PLACEMENT;  Surgeon: Wilhelmenia Aloha Raddle., MD;  Location: [facility];  Service: Gastroenterology;;   HEMOSTASIS CONTROL  11/12/2020   Procedure: HEMOSTASIS CONTROL;  Surgeon: Wilhelmenia Aloha Raddle., MD;  Location: [facility];  Service: Gastroenterology;;   HOT HEMOSTASIS N/A 10/28/2018   Procedure: HOT HEMOSTASIS (ARGON PLASMA COAGULATION/BICAP);  Surgeon: Eda Iha, MD;  Location: [facility] ENDOSCOPY;  Service: Gastroenterology;  Laterality: N/A;   HOT HEMOSTASIS N/A 11/12/2020   Procedure: HOT HEMOSTASIS (ARGON PLASMA COAGULATION/BICAP);  Surgeon: Wilhelmenia Aloha Raddle., MD;  Location: [facility];  Service: Gastroenterology;  Laterality: N/A;   HOT HEMOSTASIS N/A 03/22/2022   Procedure: HOT HEMOSTASIS (ARGON PLASMA COAGULATION/BICAP);  Surgeon: Leigh Elspeth SQUIBB, MD;  Location: [facility] ENDOSCOPY;  Service: Gastroenterology;  Laterality: N/A;   HOT HEMOSTASIS N/A 05/20/2023   Procedure: HOT HEMOSTASIS (ARGON PLASMA COAGULATION/BICAP);  Surgeon: Legrand Victory LITTIE DOUGLAS, MD;  Location: [facility] ENDOSCOPY;  Service: Gastroenterology;  Laterality: N/A;   HOT HEMOSTASIS N/A 08/24/2023   Procedure: EGD, WITH ARGON PLASMA COAGULATION;  Surgeon: Suzann [name] HERO,  MD;  Location: [facility] ENDOSCOPY;  Service: Gastroenterology;  Laterality: N/A;   INCISION AND DRAINAGE PERIRECTAL ABSCESS N/A 06/05/2017   Procedure: IRRIGATION AND DEBRIDEMENT PERIRECTAL ABSCESS;  Surgeon: Teresa Lonni HERO, MD;  Location: [facility];  Service: General;  Laterality: N/A;   IR CATHETER TUBE CHANGE  05/14/2023   LAPAROSCOPIC APPENDECTOMY N/A 05/01/2023   Procedure: APPENDECTOMY LAPAROSCOPIC;  Surgeon: Ebbie Cough, MD;  Location: [facility] OR;  Service: General;  Laterality: N/A;   PERIPHERAL VASCULAR BALLOON ANGIOPLASTY Left 08/02/2023   Procedure: PERIPHERAL VASCULAR BALLOON ANGIOPLASTY;  Surgeon: Magda Debby SAILOR, MD;  Location: [facility];  Service: Cardiovascular;  Laterality: Left;   PERIPHERAL VASCULAR INTERVENTION  12/07/2022   Procedure: PERIPHERAL VASCULAR INTERVENTION;  Surgeon: Magda Debby SAILOR, MD;  Location: [facility];  Service: Cardiovascular;;   PERIPHERAL VASCULAR INTERVENTION Left 08/02/2023   Procedure: PERIPHERAL VASCULAR INTERVENTION;  Surgeon: Magda Debby SAILOR, MD;  [facility]: [facility];  Service: Cardiovascular;  Laterality: Left;   SUBMUCOSAL TATTOO INJECTION  11/12/2020   Procedure: SUBMUCOSAL TATTOO INJECTION;  Surgeon: Wilhelmenia Aloha Raddle., MD;  Location: [facility];  Service: Gastroenterology;;   SUBMUCOSAL TATTOO INJECTION  05/20/2023   Procedure: SUBMUCOSAL TATTOO INJECTION;  Surgeon: Legrand Victory LITTIE DOUGLAS, MD;  Location: [facility] ENDOSCOPY;  Service: Gastroenterology;;   VIDEO  BRONCHOSCOPY Bilateral 05/08/2016   Procedure: VIDEO BRONCHOSCOPY WITH FLUORO;  Surgeon: Harden LULLA Staff, MD;  Location: [facility] ENDOSCOPY;  Service: Cardiopulmonary;  Laterality: Bilateral;    I have reviewed the social history and family history with the patient and they are unchanged from previous note.  ALLERGIES:  is allergic to lisinopril .  MEDICATIONS:  Current Outpatient Medications  Medication Sig Dispense Refill   albuterol  (PROVENTIL ) (2.5 MG/3ML) 0.083%  nebulizer solution Take 3 mLs (2.5 mg total) by nebulization every 2 (two) hours as needed for wheezing or shortness of breath. 75 mL 1   albuterol  (VENTOLIN  HFA) 108 (90 Base) MCG/ACT inhaler INHALE 2 PUFFS INTO THE LUNGS EVERY 6 (SIX) HOURS AS NEEDED FOR WHEEZING OR SHORTNESS OF BREATH. 18 g 6   aspirin  EC 81 MG tablet Take 81 mg by mouth daily. Swallow whole.     atorvastatin  (LIPITOR ) 80 MG tablet Take 1 tablet (80 mg total) by mouth daily. 90 tablet 1   bisoprolol  (ZEBETA ) 5 MG tablet Take 1 tablet (5 mg total) by mouth daily. 90 tablet 1   budesonide -glycopyrrolate -formoterol  (BREZTRI  AEROSPHERE) 160-9-4.8 MCG/ACT AERO inhaler Take 2 puffs first thing in morning and then another 2 puffs about 12 hours later. 10.7 g 11   buPROPion  (WELLBUTRIN  SR) 150 MG 12 hr tablet Take 1 tablet (150 mg total) by mouth daily for 3 days, THEN 1 tablet (150 mg total) 2 (two) times daily thereafter for smoking cessation. 60 tablet 3   cetirizine  (ZYRTEC ) 10 MG tablet Take 1 tablet (10 mg total) by mouth daily. 30 tablet 1   DULoxetine  (CYMBALTA ) 60 MG capsule Take 1 capsule (60 mg total) by mouth daily. For chronic leg pains 90 capsule 1   ergocalciferol  (DRISDOL ) 1.25 MG (50000 UT) capsule Take 1 capsule (50,000 Units total) by mouth once a week. 12 capsule 1   ezetimibe  (ZETIA ) 10 MG tablet Take 1 tablet (10 mg total) by mouth daily. 90 tablet 3   ferrous sulfate  (FEROSUL) 325 (65 FE) MG tablet Take 1 tablet (325 mg total) by mouth 2 (two) times daily with a meal. 180 tablet 1   fluticasone  (FLONASE ) 50 MCG/ACT nasal spray Place 2 sprays into both nostrils daily. 16 g 6   folic acid  (FOLVITE ) 1 MG tablet Take 1 tablet (1 mg total) by mouth daily. 90 tablet 1   furosemide  (LASIX ) 40 MG tablet Take 1 tablet (40 mg total) by mouth 2 (two) times daily. 180 tablet 1   ipratropium-albuterol  (DUONEB) 0.5-2.5 (3) MG/3ML SOLN Take 3 mLs by nebulization every 6 (six) hours. 360 mL 0   nitroGLYCERIN  (NITROSTAT ) 0.4 MG SL  tablet PLACE 1 TABLET (0.4 MG TOTAL) UNDER THE TONGUE EVERY 5 (FIVE) MINUTES AS NEEDED FOR CHEST PAIN. CALL 911 IF SYMPTOMS DO NOT RESOLVE AFTER 3 MINUTES 25 tablet 1   OXYGEN  Inhale 2 L/min into the lungs continuous.     pantoprazole  (PROTONIX ) 40 MG tablet Take 1 tablet (40 mg total) by mouth daily. 90 tablet 1   rivaroxaban  (XARELTO ) 20 MG TABS tablet Take 1 tablet (20 mg total) by mouth daily with supper. 90 tablet 1   topiramate  (TOPAMAX ) 25 MG tablet Take 1 tablet by mouth daily at night for 1 week  then increase to 1 tablet twice daily 60 tablet 1   butalbital -acetaminophen -caffeine  (FIORICET ) 50-325-40 MG tablet Take 1 tablet by mouth every 12 (twelve) hours as needed for headache. (Patient not taking: Reported on 07/08/2024) 30 tablet 1   gabapentin  (NEURONTIN ) 300  MG capsule Take 1 capsule (300 mg total) by mouth at bedtime. For leg pains (Patient not taking: Reported on 07/08/2024) 90 capsule 1   No current facility-administered medications for this visit.    PHYSICAL EXAMINATION: ECOG PERFORMANCE STATUS: 2 - Symptomatic, <50% confined to bed  Vitals:   07/08/24 0950  BP: 117/68  Pulse: 78  Resp: 17  Temp: (!) 94.8 F (34.9 C)  SpO2: 100%   Wt Readings from Last 3 Encounters:  07/08/24 192 lb 6.4 oz (87.3 kg)  06/01/24 189 lb (85.7 kg)  05/29/24 192 lb (87.1 kg)     GENERAL:alert, no distress and comfortable SKIN: skin color, texture, turgor are normal, no rashes or significant lesions EYES: normal, Conjunctiva are pink and non-injected, sclera clear Musculoskeletal:no cyanosis of digits and no clubbing  NEURO: alert & oriented x 3 with fluent speech, no focal motor/sensory deficits  Physical Exam    LABORATORY DATA:  I have reviewed the data as listed    Latest Ref Rng & Units 07/08/2024    8:55 AM 06/10/2024    8:50 AM 05/26/2024   10:56 AM  CBC  WBC 4.0 - 10.5 K/uL 7.5  6.7  6.1   Hemoglobin 13.0 - 17.0 g/dL 89.0  88.9  88.1   Hematocrit 39.0 - 52.0 % 35.9   36.0  38.3   Platelets 150 - 400 K/uL 259  222  197         Latest Ref Rng & Units 05/27/2024   11:15 AM 05/26/2024   10:56 AM 03/11/2024    1:19 PM  CMP  Glucose 70 - 99 mg/dL  95  893   BUN 8 - 27 mg/dL  24  9   Creatinine 9.23 - 1.27 mg/dL  9.04  9.09   Sodium 865 - 144 mmol/L  144  144   Potassium 3.5 - 5.2 mmol/L  4.8  3.7   Chloride 96 - 106 mmol/L  101  96   CO2 20 - 29 mmol/L  29    Calcium  8.6 - 10.2 mg/dL  9.4    Total Protein 6.0 - 8.5 g/dL 6.3     Total Bilirubin 0.0 - 1.2 mg/dL 0.2     Alkaline Phos 47 - 123 IU/L 69     AST 0 - 40 IU/L 24     ALT 0 - 44 IU/L 33         RADIOGRAPHIC STUDIES: I have personally reviewed the radiological images as listed and agreed with the findings in the report. No results found.    No orders of the defined types were placed in this encounter.  All questions were answered. The patient knows to call the clinic with any problems, questions or concerns. No barriers to learning was detected. The total time spent in the appointment was 25 minutes, including review of chart and various tests results, discussions about plan of care and coordination of care plan     Onita Mattock, MD 07/08/2024     "

## 2024-07-09 ENCOUNTER — Telehealth: Payer: Self-pay | Admitting: Pharmacy Technician

## 2024-07-09 NOTE — Telephone Encounter (Signed)
 Auth Submission: NO AUTH NEEDED Site of care: Site of care: [facility] Payer: [employer] MEDICARE Medication & CPT/J Code(s) submitted: Venofer  (Iron  Sucrose) J1756 Diagnosis Code: D50.0 Route of submission (phone, fax, portal):  Phone # Fax # Auth type: Buy/Bill PB Units/visits requested: 2 DOSES Reference number:  Approval from: 07/09/24 to 10/08/24

## 2024-07-10 ENCOUNTER — Other Ambulatory Visit: Payer: Self-pay

## 2024-07-10 ENCOUNTER — Telehealth: Payer: Self-pay

## 2024-07-10 NOTE — Telephone Encounter (Signed)
-----   Message from Onita Mattock, MD sent at 07/08/2024 11:57 AM EST ----- Please arrange venofer  300mg X2 at [facility], thx

## 2024-07-10 NOTE — Telephone Encounter (Signed)
 Sent Secure Chat message to [facility] scheduler.  Pt is scheduled on 07/15/2024.

## 2024-07-13 ENCOUNTER — Other Ambulatory Visit: Payer: Self-pay

## 2024-07-13 ENCOUNTER — Other Ambulatory Visit: Payer: Self-pay | Admitting: Family Medicine

## 2024-07-14 ENCOUNTER — Encounter (HOSPITAL_COMMUNITY): Payer: Self-pay | Admitting: Hematology

## 2024-07-14 ENCOUNTER — Other Ambulatory Visit: Payer: Self-pay

## 2024-07-14 MED ORDER — CETIRIZINE HCL 10 MG PO TABS
10.0000 mg | ORAL_TABLET | Freq: Every day | ORAL | 1 refills | Status: AC
  Filled 2024-07-14: qty 30, 30d supply, fill #0

## 2024-07-14 MED ORDER — BUPROPION HCL ER (SR) 150 MG PO TB12
ORAL_TABLET | ORAL | 3 refills | Status: AC
  Filled 2024-07-14: qty 60, 31d supply, fill #0

## 2024-07-15 ENCOUNTER — Other Ambulatory Visit: Payer: Self-pay

## 2024-07-15 ENCOUNTER — Inpatient Hospital Stay (HOSPITAL_COMMUNITY): Admission: RE | Admit: 2024-07-15 | Discharge: 2024-07-15 | Attending: Hematology

## 2024-07-15 VITALS — BP 116/77 | HR 58 | Temp 97.9°F | Resp 20 | Ht 74.0 in | Wt 190.0 lb

## 2024-07-15 DIAGNOSIS — D649 Anemia, unspecified: Secondary | ICD-10-CM

## 2024-07-15 DIAGNOSIS — D500 Iron deficiency anemia secondary to blood loss (chronic): Secondary | ICD-10-CM

## 2024-07-15 DIAGNOSIS — Z8719 Personal history of other diseases of the digestive system: Secondary | ICD-10-CM

## 2024-07-15 MED ORDER — IRON SUCROSE 300 MG IVPB - SIMPLE MED
Status: AC
  Filled 2024-07-15: qty 265

## 2024-07-15 MED ORDER — IRON SUCROSE 300 MG IVPB - SIMPLE MED
300.0000 mg | Freq: Once | Status: AC
  Administered 2024-07-15: 300 mg via INTRAVENOUS

## 2024-07-16 ENCOUNTER — Other Ambulatory Visit: Payer: Self-pay

## 2024-07-16 NOTE — Patient Outreach (Addendum)
 Complex Care Management   Visit Note  07/16/2024  Name:  Jared [name] MRN: [id number] DOB: [birth year]  Situation: Referral received for Complex Care Management related to COPD I obtained verbal consent from Patient.  Visit completed with Jared [name]  on the phone  Background:   Past Medical History:  Diagnosis Date   Acute blood loss anemia 05/18/2023   Acute perforated appendicitis 04/30/2023   AKI (acute kidney injury) 11/18/2020   Atrial fibrillation with RVR (HCC) 05/06/2023   AVM (arteriovenous malformation)    CAD (coronary artery disease)    a. LHC 5/12:  LAD 20, pLCx 20, pRCA 40, dRCA 40, EF 35%, diff HK  //  b. Myoview 4/16: Overall Impression:  High risk stress nuclear study There is no evidence of ischemia.  There is severe LV dysfunction. LV Ejection Fraction: 30%.  LV Wall Motion:  There is global LV hypokinesis.     CAP (community acquired pneumonia) 09/2013   Chronic combined systolic and diastolic CHF (congestive heart failure) (HCC)    a. Echo 4/16:Mild LVH, EF 40-45%, diffuse HK //  b. Echo 8/17: EF 35-40%, diffuse HK, diastolic dysfunction, aortic sclerosis, trivial MR, moderate LAE, normal RVSF, moderate RAE, mild TR, PASP 42 mmHg // c. Echo 4/18: Mild concentric LVH, EF 30-35, normal wall motion, grade 1 diastolic dysfunction, PASP 49   Chronic respiratory failure (HCC)    Cluster headache    hx; haven't had one in awhile (01/09/2016)   COPD (chronic obstructive pulmonary disease) (HCC)    thelbert 01/09/2016   DM (diabetes mellitus) (HCC)    History of CVA (cerebrovascular accident)    Hypertension    IDA (iron  deficiency anemia)    Moderate tobacco use disorder    NICM (nonischemic cardiomyopathy) (HCC)    Nicotine  addiction    Peripheral vascular disease    Syncope 06/11/2019   Tobacco abuse    Type 2 diabetes mellitus (HCC) 05/14/2016    Assessment: Patient reports he is doing well. He was able to regain access to MyChart and has found that helpful to have.  He is no longer taking Muscinex due to cough much better with intermittent dry cough only. He is wearing his O2 4L via [place] with humidity and denies any recent nose bleeds since adding humidity. Last O2 SAT =100% Taking all meds as prescribed and has not missed any doses. Had Iron  infusion yesterday by Oncology. Tolerated without any side effects. Reports occasional shaking in hands and sometimes full body shakes but no LOC or other associated symptoms. Will discuss with PCP on 08/25/2024. He is interested in a Neurology consult if she is agreeable.  Patient Reported Symptoms:  Cognitive Cognitive Status: Alert and oriented to person, place, and time, Insightful and able to interpret abstract concepts, Normal speech and language skills Cognitive/Intellectual Conditions Management [RPT]: None reported or documented in medical history or problem list   Health Maintenance Behaviors: Sleep adequate, Annual physical exam, Healthy diet  Neurological Neurological Review of Symptoms: Other: O[place] Neurological Symptoms/Conditions [RPT]: reporst occasional shaking in hands and sometimes full body shakes but no LOC or other associated symptoms. Will discuss with PCP on 08/25/2024. He is interested in a Neurology consult if she is agreeable. Neurological Management Strategies: Adequate rest, Medical device, Routine screening Neurological Self-Management Outcome: 4 (good)  HEENT HEENT Symptoms Reported: No symptoms reported HEENT Management Strategies: Adequate rest, Routine screening HEENT Self-Management Outcome: 4 (good) HEENT Comment: Denies any nose bleeds since adding Humidity to oxygen   Cardiovascular Cardiovascular Symptoms Reported: No symptoms reported Patient's Recent BP reading at home: Denies any chest tightness, swelling in ankles or feet today Cardiovascular Management Strategies: Medication therapy, Routine screening, Coping strategies, Adequate rest Cardiovascular Self-Management Outcome: 4  (good)  Respiratory Respiratory Symptoms Reported: Dry cough Other Respiratory Symptoms: Denies any wheezing or increase in SOB. Wearing oxygen  at 4L with humidity. No longer taking mucinex  due to cough much better but still has intermittent dry cough. Additional Respiratory Details: Continues to smoke a few cigarettes a day and not interested in smoking cessation at this time. Respiratory Management Strategies: Oxygen  therapy, Adequate rest, Coping strategies, Routine screening Respiratory Self-Management Outcome: 4 (good)  Endocrine Endocrine Symptoms Reported: No symptoms reported Is patient diabetic?: No Is patient checking blood sugars at home?: No Endocrine Self-Management Outcome: 5 (very good)  Gastrointestinal Gastrointestinal Symptoms Reported: No symptoms reported Additional Gastrointestinal Details: Denies any GI upset after Iron  infusions. Gastrointestinal Management Strategies: Adequate rest, Coping strategies Gastrointestinal Self-Management Outcome: 5 (very good)    Genitourinary Genitourinary Symptoms Reported: No symptoms reported Genitourinary Management Strategies: Adequate rest Genitourinary Self-Management Outcome: 4 (good)  Integumentary Integumentary Symptoms Reported: Not assessed    Musculoskeletal Musculoskelatal Symptoms Reviewed: Unsteady gait Additional Musculoskeletal Details: Uses rollator during ambulation Musculoskeletal Management Strategies: Adequate rest, Medical device, Routine screening Musculoskeletal Self-Management Outcome: 4 (good)      Psychosocial Psychosocial Symptoms Reported: No symptoms reported Behavioral Management Strategies: Coping strategies, Adequate rest        07/16/2024    PHQ2-9 Depression Screening   Little interest or pleasure in doing things    Feeling down, depressed, or hopeless    PHQ-2 - Total Score    Trouble falling or staying asleep, or sleeping too much    Feeling tired or having little energy    Poor appetite  or overeating     Feeling bad about yourself - or that you are a failure or have let yourself or your family down    Trouble concentrating on things, such as reading the newspaper or watching television    Moving or speaking so slowly that other people could have noticed.  Or the opposite - being so fidgety or restless that you have been moving around a lot more than usual    Thoughts that you would be better off dead, or hurting yourself in some way    PHQ2-9 Total Score    If you checked off any problems, how difficult have these problems made it for you to do your work, take care of things at home, or get along with other people    Depression Interventions/Treatment      There were no vitals filed for this visit. Pain Scale: 0-10 Pain Score: 0-No pain  Medications Reviewed Today   Medications were not reviewed in this encounter     Recommendation:   Continue Current Plan of Care Review written education on COPD added to today's note in Mychart: COPD Action Plan  Follow Up Plan:   Telephone follow up appointment date/time:  08/06/2024 at 10:00 am  Elida Pulse, [facility] Case Manager [facility] [place], [facility] Direct Dial: [phone]

## 2024-07-16 NOTE — Patient Instructions (Signed)
 Visit Information  Thank you for taking time to visit with me today. Please don't hesitate to contact me if I can be of assistance to you before our next scheduled appointment.  Your next care management appointment is by telephone on 08/06/2024 at 10:00 am   Please call the care guide team at [phone] if you need to cancel, schedule, or reschedule an appointment.   Please call the [place]  National Suicide Prevention Lifeline: 249-788-8947 or TTY: 8105663940 TTY [phone]) to talk to a trained counselor if you are experiencing a Mental Health or Behavioral Health Crisis or need someone to talk to.  Elida Pulse, RNCM Case Manager [facility], Population Health Direct Dial: [phone]

## 2024-07-22 ENCOUNTER — Encounter (HOSPITAL_COMMUNITY)

## 2024-08-06 ENCOUNTER — Telehealth

## 2024-08-25 ENCOUNTER — Ambulatory Visit: Payer: Self-pay | Admitting: Family Medicine

## 2024-10-06 ENCOUNTER — Inpatient Hospital Stay: Attending: Nurse Practitioner

## 2024-11-09 ENCOUNTER — Ambulatory Visit: Admitting: Internal Medicine

## 2025-01-05 ENCOUNTER — Inpatient Hospital Stay: Attending: Nurse Practitioner

## 2025-04-07 ENCOUNTER — Inpatient Hospital Stay: Admitting: Hematology

## 2025-04-07 ENCOUNTER — Inpatient Hospital Stay: Attending: Nurse Practitioner
